# Patient Record
Sex: Male | Born: 1970 | Hispanic: Yes | Marital: Single | Smoking: Never smoker
Health system: Southern US, Community
[De-identification: ages and names within clinical notes are randomized; demographics above are authoritative.]

## PROBLEM LIST (undated history)

## (undated) DIAGNOSIS — B20 Human immunodeficiency virus [HIV] disease: Secondary | ICD-10-CM

## (undated) DIAGNOSIS — T8789 Other complications of amputation stump: Secondary | ICD-10-CM

## (undated) DIAGNOSIS — N289 Disorder of kidney and ureter, unspecified: Secondary | ICD-10-CM

## (undated) DIAGNOSIS — L89892 Pressure ulcer of other site, stage 2: Secondary | ICD-10-CM

## (undated) DIAGNOSIS — M86679 Other chronic osteomyelitis, unspecified ankle and foot: Secondary | ICD-10-CM

## (undated) DIAGNOSIS — N186 End stage renal disease: Secondary | ICD-10-CM

## (undated) DIAGNOSIS — M9080 Osteopathy in diseases classified elsewhere, unspecified site: Secondary | ICD-10-CM

## (undated) DIAGNOSIS — Z992 Dependence on renal dialysis: Secondary | ICD-10-CM

## (undated) DIAGNOSIS — K859 Acute pancreatitis without necrosis or infection, unspecified: Secondary | ICD-10-CM

## (undated) DIAGNOSIS — B182 Chronic viral hepatitis C: Secondary | ICD-10-CM

## (undated) DIAGNOSIS — E1165 Type 2 diabetes mellitus with hyperglycemia: Secondary | ICD-10-CM

## (undated) DIAGNOSIS — B1920 Unspecified viral hepatitis C without hepatic coma: Secondary | ICD-10-CM

## (undated) DIAGNOSIS — I959 Hypotension, unspecified: Secondary | ICD-10-CM

## (undated) DIAGNOSIS — D649 Anemia, unspecified: Secondary | ICD-10-CM

## (undated) DIAGNOSIS — E889 Metabolic disorder, unspecified: Secondary | ICD-10-CM

## (undated) DIAGNOSIS — E1140 Type 2 diabetes mellitus with diabetic neuropathy, unspecified: Secondary | ICD-10-CM

## (undated) DIAGNOSIS — K529 Noninfective gastroenteritis and colitis, unspecified: Secondary | ICD-10-CM

## (undated) DIAGNOSIS — A4902 Methicillin resistant Staphylococcus aureus infection, unspecified site: Secondary | ICD-10-CM

## (undated) DIAGNOSIS — E43 Unspecified severe protein-calorie malnutrition: Secondary | ICD-10-CM

## (undated) DIAGNOSIS — E118 Type 2 diabetes mellitus with unspecified complications: Secondary | ICD-10-CM

## (undated) HISTORY — DX: Other complications of amputation stump: T87.89

## (undated) HISTORY — DX: Acute pancreatitis without necrosis or infection, unspecified: K85.90

## (undated) HISTORY — DX: Chronic viral hepatitis C: B18.2

## (undated) HISTORY — DX: Human immunodeficiency virus (HIV) disease: B20

## (undated) HISTORY — DX: Pressure ulcer of other site, stage 2: L89.892

## (undated) HISTORY — PX: AV FISTULA PLACEMENT: SHX1204

## (undated) NOTE — *Deleted (*Deleted)
[facility] EMERGENCY DEPARTMENT Provider Note   CSN: [id number] Arrival date & time: 02/28/20  1504     History Chief Complaint  Patient presents with  . Chest Pain  . Fever    Alvin Daniels is a 19 y.o. male.  HPI      67 year old male with history of diabetes type 2, HIV on HAART, ESRD on dialysis Monday Wednesday Friday, bilateral BKA's, admission in July with concern for MRSA bacteremia right stump cellulitis who presents with concern for cough, chills and fever.  While he was hospitalized he was unable to have a TEE due to low blood pressure and risk, but decision was made to proceed with 6 weeks of treatment with IV vancomycin with dialysis.  4 days ago, he developed right-sided chest pain worse with deep breaths, cough.  He was evaluated yesterday in the [facility] ED and had a negative chest x-ray, negative Covid testing, and was given p.o. antibiotics.  He completed dialysis today, but describes severe chills and continued feeling of being unwell, and EMS was called and brought him to the emergency department.  His blood pressures were in the 0000000 systolic on arrival with tachycardia and a fever of 102.  BP improved with fluids.  Pt reports his baseline blood pressures are pretty low. (25s)  Spanish interpreter used  Past Medical History:  Diagnosis Date  . AIDS ([place]) 11/22/2014  . Back pain 06/09/2019  . BKA stump complication ([place]) [id number]  . Chronic diarrhea   . Chronic hepatitis C without hepatic coma ([place]) 11/22/2014  . 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 Diabetic neuropathy ([place])   . ESRD (end stage renal disease) on dialysis [facility])    "TTS; don't remember street name" (05/03/2014)  . Hepatitis C   . HIV INFECTION 06/27/2010   Qualifier: Diagnosis of  By: Nickola Major CMA ( [place]), Geni Bers    . Hypotension 06/02/2012  . Metabolic bone disease [id number]  . MRSA infection   . Normocytic anemia 06/17/2012  . Pancreatitis   .  Pressure ulcer of BKA stump, stage 2 ([place]) 11/22/2014  . Renal disorder   . Severe protein-calorie malnutrition ([place]) 06/17/2012  . Uncontrolled diabetes mellitus with complications ([place]) 0000000   Annotation: uncontrolled Qualifier: Diagnosis of  By: Nickola Major CMA Deborra Medina), Jacqueline      Patient Active Problem List   Diagnosis Date Noted  . Amputation stump infection ([place]) 12/02/2019  . Scapular fracture 09/28/2019  . COVID-19 virus infection 09/28/2019  . Chest pain 09/27/2019  . Hx MRSA infection 09/21/2019  . BKA stump complication ([place]) [id number]  . Back pain 06/09/2019  . Abscess of foot   . Abscess of ankle 08/29/2018  . Charcot foot due to diabetes mellitus ([place]) 07/23/2018  . MRSA bacteremia   . Bacteremia 06/18/2018  . Diabetic foot ulcer ([place]) 11/19/2017  . Femur fracture, left ([place]) 08/16/2016  . HIV (human immunodeficiency virus infection) ([place]) 08/16/2016  . GERD (gastroesophageal reflux disease) 05/24/2016  . Burn 04/10/2016  . Pressure ulcer of BKA stump, stage 2 ([name]) 11/22/2014  . Chronic hepatitis C without hepatic coma ([place]) 11/22/2014  . S/P bilateral BKA (below knee amputation) ([place]) 06/17/2014  . HIV disease ([place])   . Poor dentition 10/25/2013  . ESRD on hemodialysis ([place]) 10/25/2013  . Vision changes 06/21/2013  . Orthostatic headache 04/15/2013  . Anemia of chronic renal failure 06/19/2012  . Metabolic bone disease [id number]  . Chronic diarrhea 06/17/2012  . Severe  protein-calorie malnutrition ([place]) 06/17/2012  . Normocytic anemia 06/17/2012  . Cellulitis of right lower extremity 10/19/2010  . Polyneuropathy in diabetes(357.2) 06/27/2010  . Diabetes mellitus with ESRD (end-stage renal disease) ([place]) 01/03/2010    Past Surgical History:  Procedure Laterality Date  . AMPUTATION Left 04/20/2014   Procedure: 3rd toe amputation, 4th Toe Amputation,  5th Toe Amputation;  Surgeon: Newt Minion, MD;  Location: [place];  Service: Orthopedics;   Laterality: Left;  . AMPUTATION Left 05/02/2014   Procedure: Midfoot Amputation;  Surgeon: Newt Minion, MD;  Location: [place];  Service: Orthopedics;  Laterality: Left;  . AMPUTATION Left 06/17/2014   Procedure: AMPUTATION BELOW KNEE;  Surgeon: Newt Minion, MD;  Location: [place];  Service: Orthopedics;  Laterality: Left;  . AMPUTATION Right 09/04/2018   Procedure: RIGHT BELOW KNEE AMPUTATION;  Surgeon: Newt Minion, MD;  Location: [place];  Service: Orthopedics;  Laterality: Right;  RIGHT BELOW KNEE AMPUTATION  . AV FISTULA PLACEMENT Left   . AV FISTULA PLACEMENT Left 05/10/2016   Procedure: Creation Left Arm Brachiocephalic Arteriovenous Fistula and Ligation of Radiocephalic Fistula;  Surgeon: Angelia Mould, MD;  Location: [place];  Service: Vascular;  Laterality: Left;  . FEMUR IM NAIL Left 08/17/2016   Procedure: INTRAMEDULLARY (IM) RETROGRADE FEMORAL NAILING;  Surgeon: Marybelle Killings, MD;  Location: [place];  Service: Orthopedics;  Laterality: Left;  . FOOT AMPUTATION THROUGH ANKLE Left 12/'21/2015   midfoot  . IR GENERIC HISTORICAL Left 05/01/2016   IR THROMBECTOMY AV FISTULA W/THROMBOLYSIS/PTA INC/SHUNT/IMG LEFT 05/01/2016 [name], MD [facility]  . IR GENERIC HISTORICAL  05/01/2016   IR US GUIDE VASC ACCESS LEFT 05/01/2016 [name], MD [facility]  . IR GENERIC HISTORICAL  05/07/2016   IR FLUORO GUIDE CV LINE RIGHT 05/07/2016 Corrie Mckusick, DO [facility]  . IR GENERIC HISTORICAL  05/07/2016   IR US GUIDE VASC ACCESS RIGHT 05/07/2016 Corrie Mckusick, DO [facility]  . IR GENERIC HISTORICAL  05/22/2016   IR US GUIDE VASC ACCESS RIGHT 05/22/2016 Greggory Keen, MD [facility]  . IR GENERIC HISTORICAL  05/22/2016   IR FLUORO GUIDE CV LINE RIGHT 05/22/2016 Greggory Keen, MD [facility]  . IR REMOVAL TUN CV CATH W/O FL  08/21/2016  . PERIPHERAL VASCULAR CATHETERIZATION Left 05/09/2016   Procedure: A/V Fistulagram;  Surgeon: Angelia Mould, MD;  Location: [place]  CV LAB;  Service: Cardiovascular;  Laterality: Left;  arm  . TEE WITHOUT CARDIOVERSION N/A 06/22/2018   Procedure: TRANSESOPHAGEAL ECHOCARDIOGRAM (TEE);  Surgeon: Jerline Pain, MD;  Location: [facility] ENDOSCOPY;  Service: Cardiovascular;  Laterality: N/A;       Family History  Problem Relation Age of Onset  . Diabetes Mother   . Diabetes Father     Social History   Tobacco Use  . Smoking status: Never Smoker  . Smokeless tobacco: Never Used  Vaping Use  . Vaping Use: Never used  Substance Use Topics  . Alcohol use: No  . Drug use: No    Home Medications Prior to Admission medications   Medication Sig Start Date End Date Taking? Authorizing Provider  amoxicillin (AMOXIL) 500 MG capsule Take 1 capsule (500 mg total) by mouth daily. 02/28/20   Lajean Saver, MD  bictegravir-emtricitabine-tenofovir AF (BIKTARVY) 50-200-25 MG TABS tablet Take 1 tablet by mouth daily. 06/09/19   Truman Hayward, MD  Blood Glucose Monitoring Suppl (TRUE METRIX METER) DEVI 1 each by Does not apply route 3 (three)  times daily. 09/15/18   [name], MD  glucose blood (TRUE METRIX BLOOD GLUCOSE TEST) test strip 1 each by Other route 3 (three) times daily. 09/15/18   [name], MD  insulin NPH-regular Human (HUMULIN 70/30) (70-30) 100 UNIT/ML injection Inject 15 Units into the skin 2 (two) times daily before a meal. 02/10/20   [name], MD  Insulin Syringe-Needle U-100 (BD INSULIN SYRINGE ULTRAFINE) 31G X 15/64" 0.5 ML MISC 1 each by Does not apply route 2 (two) times daily. 02/10/20   [name], MD  midodrine (PROAMATINE) 10 MG tablet Take 1 tablet (10 mg total) by mouth 3 (three) times daily with meals. 12/10/19   Regalado, Belkys A, MD  TRUEplus Lancets 28G MISC 1 each by Does not apply route 3 (three) times daily. 09/15/18   [name], MD  vancomycin (VANCOREADY) 500 MG/100ML IVPB Inject 100 mLs (500 mg total) into the vein every Monday, Wednesday, and Friday with hemodialysis.  12/10/19 06/07/20  Regalado, Cassie Freer, MD    Allergies    Patient has no known allergies.  Review of Systems   Review of Systems  Constitutional: Positive for appetite change, chills, fatigue and fever.  HENT: Positive for sore throat.   Eyes: Negative for visual disturbance.  Respiratory: Positive for cough. Negative for shortness of breath.   Cardiovascular: Positive for chest pain.  Gastrointestinal: Negative for abdominal pain, nausea and vomiting.  Genitourinary: Negative for difficulty urinating (does not make urine).  Musculoskeletal: Negative for back pain and neck stiffness.  Skin: Negative for rash.  Neurological: Negative for syncope and headaches.    Physical Exam Updated Vital Signs BP (!) 80/47   Pulse (!) 101   Temp (!) 100.6 F (38.1 C) (Oral)   Resp (!) 22   Ht 5' (1.524 m)   Wt 59 kg   SpO2 99%   BMI 25.39 kg/m   Physical Exam Vitals and nursing note reviewed.  Constitutional:      General: He is not in acute distress.    Appearance: He is well-developed. He is not diaphoretic.  HENT:     Head: Normocephalic and atraumatic.     Comments: Midline uvula, no erythema or exudate to oropharynx Eyes:     Conjunctiva/sclera: Conjunctivae normal.  Cardiovascular:     Rate and Rhythm: Regular rhythm. Tachycardia present.     Heart sounds: Normal heart sounds. No murmur heard.  No friction rub. No gallop.   Pulmonary:     Effort: Pulmonary effort is normal. No respiratory distress.     Breath sounds: Normal breath sounds. No wheezing or rales.  Abdominal:     General: There is no distension.     Palpations: Abdomen is soft.     Tenderness: There is no abdominal tenderness. There is no guarding.  Musculoskeletal:     Cervical back: Normal range of motion.     Comments: Bilateral BKA, no erythema  Skin:    General: Skin is warm and dry.  Neurological:     Mental Status: He is alert and oriented to person, place, and time.     ED Results / Procedures  / Treatments   Labs (all labs ordered are listed, but only abnormal results are displayed) Labs Reviewed  COMPREHENSIVE METABOLIC PANEL - Abnormal; Notable for the following components:      Result Value   Chloride 91 (*)    Glucose, Bld 167 (*)    Creatinine, Ser 5.52 (*)    Calcium 8.3 (*)  Albumin 2.9 (*)    Total Bilirubin 1.5 (*)    GFR, Estimated 11 (*)    Anion gap 20 (*)    All other components within normal limits  CBC WITH DIFFERENTIAL/PLATELET - Abnormal; Notable for the following components:   WBC 12.6 (*)    RBC 2.85 (*)    Hemoglobin 9.5 (*)    HCT 28.8 (*)    MCV 101.1 (*)    Neutro Abs 11.7 (*)    Lymphs Abs 0.5 (*)    All other components within normal limits  APTT - Abnormal; Notable for the following components:   aPTT 45 (*)    All other components within normal limits  CULTURE, BLOOD (ROUTINE X 2)  CULTURE, BLOOD (ROUTINE X 2)  LACTIC ACID, PLASMA  PROTIME-INR  LACTIC ACID, PLASMA    EKG EKG Interpretation  Date/Time:  Monday February 28 2020 15:58:20 EDT Ventricular Rate:  110 PR Interval:    QRS Duration: 130 QT Interval:  340 QTC Calculation: 460 R Axis:   96 Text Interpretation: Sinus tachycardia Artifact in V5-no significant change in comparison to prior ECG Confirmed by Gareth Morgan [phone]) on 02/28/2020 7:02:58 PM   Radiology DG Chest 2 View  Result Date: 02/27/2020 CLINICAL DATA:  Chest pain EXAM: CHEST - 2 VIEW COMPARISON:  09/27/2019 FINDINGS: The heart size and mediastinal contours are within normal limits. Both lungs are clear. Nonacute fracture deformity of the mid left clavicle. IMPRESSION: No acute abnormality of the lungs. Electronically Signed   By: Eddie Candle M.D.   On: 02/27/2020 12:53   CT Angio Chest PE W and/or Wo Contrast  Result Date: 02/28/2020 CLINICAL DATA:  Bacteremia, possible septic emboli EXAM: CT ANGIOGRAPHY CHEST WITH CONTRAST TECHNIQUE: Multidetector CT imaging of the chest was performed using the  standard protocol during bolus administration of intravenous contrast. Multiplanar CT image reconstructions and MIPs were obtained to evaluate the vascular anatomy. CONTRAST:  80mL OMNIPAQUE IOHEXOL 350 MG/ML SOLN COMPARISON:  Chest x-ray from the previous day, 09/27/2019. FINDINGS: Cardiovascular: Thoracic aorta and its branches are well visualized and within normal limits. No aneurysmal dilatation or dissection is noted. Mild atherosclerotic calcifications are seen. No cardiac enlargement is seen. Coronary calcifications are noted. The pulmonary artery shows a normal branching pattern. No focal filling defect to suggest pulmonary embolism is noted. Mediastinum/Nodes: Thoracic inlet is within normal limits. No sizable hilar or mediastinal adenopathy is noted. Calcified lymph nodes are seen consistent with prior granulomatous disease. The esophagus as visualized is within normal limits. Lungs/Pleura: The lungs are well aerated bilaterally. Some changes of mucous plugging in the lower lobe bronchial tree is noted on the right. Some associated atelectatic changes are noted. Similar changes are noted in the left lower lobe. No findings to suggest septic emboli are seen. No pneumothorax or sizable effusion is noted. Upper Abdomen: Visualized upper abdomen is unremarkable. Stable calcifications along the corticomedullary junction in the kidneys are seen similar to prior CT examination. Musculoskeletal: Mild degenerative change of the thoracic spine is noted. No acute bony abnormality is noted. Review of the MIP images confirms the above findings. IMPRESSION: No evidence of pulmonary emboli. No findings to suggest septic emboli Changes of prior granulomatous disease. Mucous plugging in the lower lobes bilaterally with associated atelectatic changes. 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 Aortic Atherosclerosis (ICD10-I70.0). Electronically Signed   By: Inez Catalina M.D.   On: 02/28/2020  17:50    Procedures .Critical Care Performed by: Gareth Morgan,  MD Authorized by: Gareth Morgan, MD   Critical care provider statement:    Critical care time (minutes):  45   Critical care was time spent personally by me on the following activities:  Evaluation of patient's response to treatment, examination of patient, ordering and performing treatments and interventions, ordering and review of laboratory studies, ordering and review of radiographic studies, pulse oximetry, re-evaluation of patient's condition, obtaining history from patient or surrogate and review of old charts   (including critical care time)  Medications Ordered in ED Medications  vancomycin (VANCOREADY) IVPB 1250 mg/250 mL (1,250 mg Intravenous New Bag/Given 02/28/20 1803)  ceFEPIme (MAXIPIME) 1 g in sodium chloride 0.9 % 100 mL IVPB (has no administration in time range)  vancomycin variable dose per unstable renal function (pharmacist dosing) (has no administration in time range)  ceFEPIme (MAXIPIME) 2 g in sodium chloride 0.9 % 100 mL IVPB (0 g Intravenous Stopped 02/28/20 1759)  metroNIDAZOLE (FLAGYL) IVPB 500 mg (0 mg Intravenous Stopped 02/28/20 1833)  acetaminophen (TYLENOL) tablet 1,000 mg (1,000 mg Oral Given 02/28/20 1639)  iohexol (OMNIPAQUE) 350 MG/ML injection 62 mL (62 mLs Intravenous Contrast Given 02/28/20 1734)  sodium chloride 0.9 % bolus 1,000 mL (1,000 mLs Intravenous New Bag/Given 02/28/20 1832)    ED Course  I have reviewed the triage vital signs and the nursing notes.  Pertinent labs & imaging results that were available during my care of the patient were reviewed by me and considered in my medical decision making (see chart for details).    MDM Rules/Calculators/A&P                          64 year old male with history of diabetes type 2, HIV on HAART, ESRD on dialysis Monday Wednesday Friday, bilateral BKA's, admission in July with concern for MRSA bacteremia right stump  cellulitis who presents with concern for cough, chills and fever.  CT shows no findings to suggest PE, septic emboli or clear pneumonia.  Bilateral mucus plugging.  Labs show leukocytosis, no other significant abnormalities.  Lactate is within normal limits.  Initial blood pressure normal after fluids with EMS, however decreased to the [id number] systolic while in the emergency department.  His baseline blood pressures are 0000000 systolic per patient, and noted to be that on prior outpatient visits.  Given dialysis status, initially normal blood pressures and lactic acid, did not order 30 cc/kg of normal saline--- however, did order 1 L as blood pressures decreased.  Given history of MRSA bacteremia and fever of unclear source, possible viral etiology or possible recurrent bacteremia, will admit for continued care.  He was given vancomycin, cefepime and Flagyl.   Final Clinical Impression(s) / ED Diagnoses Final diagnoses:  Sepsis without acute organ dysfunction, due to unspecified organism ([place])  Fever, unspecified fever cause  Right-sided chest pain    Rx / DC Orders ED Discharge Orders    None       Gareth Morgan, MD 02/28/20 1905

---

## 2006-03-11 ENCOUNTER — Emergency Department (HOSPITAL_COMMUNITY): Admission: EM | Admit: 2006-03-11 | Discharge: 2006-03-11 | Payer: Self-pay | Admitting: Emergency Medicine

## 2009-09-28 ENCOUNTER — Inpatient Hospital Stay (HOSPITAL_COMMUNITY): Admission: EM | Admit: 2009-09-28 | Discharge: 2009-10-03 | Payer: Self-pay | Admitting: Emergency Medicine

## 2009-09-29 ENCOUNTER — Ambulatory Visit: Payer: Self-pay | Admitting: Infectious Diseases

## 2009-10-06 ENCOUNTER — Ambulatory Visit: Payer: Self-pay | Admitting: Internal Medicine

## 2009-10-20 ENCOUNTER — Ambulatory Visit: Payer: Self-pay | Admitting: Internal Medicine

## 2009-10-25 ENCOUNTER — Ambulatory Visit: Payer: Self-pay | Admitting: Internal Medicine

## 2009-10-25 LAB — CONVERTED CEMR LAB
Absolute CD4: 263 #/uL — ABNORMAL LOW (ref 381–1469)
Basophils Relative: 0 % (ref 0–1)
CO2: 22 meq/L (ref 19–32)
Creatinine, Ser: 1.69 mg/dL — ABNORMAL HIGH (ref 0.40–1.50)
Eosinophils Absolute: 0.2 10*3/uL (ref 0.0–0.7)
Eosinophils Relative: 3 % (ref 0–5)
Glucose, Bld: 367 mg/dL — ABNORMAL HIGH (ref 70–99)
HCT: 33 % — ABNORMAL LOW (ref 39.0–52.0)
HIV 1 RNA Quant: 24600 copies/mL — ABNORMAL HIGH (ref <48)
HIV-1 RNA Quant, Log: 4.39 — ABNORMAL HIGH (ref <1.68)
Hemoglobin: 10.8 g/dL — ABNORMAL LOW (ref 13.0–17.0)
MCHC: 32.7 g/dL (ref 30.0–36.0)
MCV: 87.1 fL (ref 78.0–100.0)
Monocytes Absolute: 0.5 10*3/uL (ref 0.1–1.0)
Monocytes Relative: 8 % (ref 3–12)
RBC: 3.79 M/uL — ABNORMAL LOW (ref 4.22–5.81)
Sodium: 134 meq/L — ABNORMAL LOW (ref 135–145)
Total Bilirubin: 0.4 mg/dL (ref 0.3–1.2)
Total Protein: 9.2 g/dL — ABNORMAL HIGH (ref 6.0–8.3)

## 2009-10-27 ENCOUNTER — Ambulatory Visit: Payer: Self-pay | Admitting: Internal Medicine

## 2009-11-01 ENCOUNTER — Ambulatory Visit: Payer: Self-pay | Admitting: Internal Medicine

## 2009-11-30 ENCOUNTER — Ambulatory Visit: Payer: Self-pay | Admitting: Cardiology

## 2009-11-30 ENCOUNTER — Inpatient Hospital Stay (HOSPITAL_COMMUNITY): Admission: EM | Admit: 2009-11-30 | Discharge: 2009-12-12 | Payer: Self-pay | Admitting: Emergency Medicine

## 2009-11-30 ENCOUNTER — Ambulatory Visit: Payer: Self-pay | Admitting: Internal Medicine

## 2009-12-01 ENCOUNTER — Encounter (INDEPENDENT_AMBULATORY_CARE_PROVIDER_SITE_OTHER): Payer: Self-pay | Admitting: Internal Medicine

## 2009-12-04 ENCOUNTER — Ambulatory Visit: Payer: Self-pay | Admitting: Infectious Diseases

## 2009-12-05 ENCOUNTER — Encounter (INDEPENDENT_AMBULATORY_CARE_PROVIDER_SITE_OTHER): Payer: Self-pay | Admitting: Cardiology

## 2009-12-12 ENCOUNTER — Ambulatory Visit: Payer: Self-pay | Admitting: Internal Medicine

## 2009-12-25 ENCOUNTER — Encounter: Payer: Self-pay | Admitting: Infectious Disease

## 2009-12-26 ENCOUNTER — Encounter: Payer: Self-pay | Admitting: Infectious Diseases

## 2009-12-27 ENCOUNTER — Telehealth: Payer: Self-pay | Admitting: Infectious Disease

## 2009-12-27 ENCOUNTER — Ambulatory Visit: Payer: Self-pay | Admitting: Internal Medicine

## 2009-12-28 ENCOUNTER — Encounter: Payer: Self-pay | Admitting: Infectious Disease

## 2009-12-28 ENCOUNTER — Telehealth: Payer: Self-pay | Admitting: Infectious Disease

## 2009-12-28 DIAGNOSIS — A4101 Sepsis due to Methicillin susceptible Staphylococcus aureus: Secondary | ICD-10-CM | POA: Insufficient documentation

## 2010-01-03 ENCOUNTER — Ambulatory Visit: Payer: Self-pay | Admitting: Internal Medicine

## 2010-01-03 ENCOUNTER — Ambulatory Visit: Payer: Self-pay | Admitting: Infectious Disease

## 2010-01-03 DIAGNOSIS — E1122 Type 2 diabetes mellitus with diabetic chronic kidney disease: Secondary | ICD-10-CM | POA: Insufficient documentation

## 2010-01-03 DIAGNOSIS — N186 End stage renal disease: Secondary | ICD-10-CM

## 2010-01-03 DIAGNOSIS — B182 Chronic viral hepatitis C: Secondary | ICD-10-CM | POA: Insufficient documentation

## 2010-01-05 ENCOUNTER — Ambulatory Visit: Payer: Self-pay | Admitting: Internal Medicine

## 2010-01-11 ENCOUNTER — Encounter (INDEPENDENT_AMBULATORY_CARE_PROVIDER_SITE_OTHER): Payer: Self-pay | Admitting: *Deleted

## 2010-02-14 ENCOUNTER — Encounter (INDEPENDENT_AMBULATORY_CARE_PROVIDER_SITE_OTHER): Payer: Self-pay | Admitting: Internal Medicine

## 2010-02-14 LAB — CONVERTED CEMR LAB
Albumin: 3.5 g/dL (ref 3.5–5.2)
Basophils Absolute: 0 10*3/uL (ref 0.0–0.1)
Basophils Relative: 0 % (ref 0–1)
Calcium: 8.7 mg/dL (ref 8.4–10.5)
Eosinophils Absolute: 0.3 10*3/uL (ref 0.0–0.7)
Eosinophils Relative: 5 % (ref 0–5)
HCT: 29.6 % — ABNORMAL LOW (ref 39.0–52.0)
Lymphocytes Relative: 31 % (ref 12–46)
Phosphorus: 6.6 mg/dL — ABNORMAL HIGH (ref 2.3–4.6)
Platelets: 192 10*3/uL (ref 150–400)
RDW: 14.7 % (ref 11.5–15.5)
Sodium: 133 meq/L — ABNORMAL LOW (ref 135–145)

## 2010-02-15 ENCOUNTER — Encounter (INDEPENDENT_AMBULATORY_CARE_PROVIDER_SITE_OTHER): Payer: Self-pay | Admitting: Internal Medicine

## 2010-02-21 ENCOUNTER — Encounter: Payer: Self-pay | Admitting: Infectious Disease

## 2010-02-23 ENCOUNTER — Encounter (INDEPENDENT_AMBULATORY_CARE_PROVIDER_SITE_OTHER): Payer: Self-pay | Admitting: Internal Medicine

## 2010-02-23 LAB — CONVERTED CEMR LAB
BUN: 106 mg/dL — ABNORMAL HIGH (ref 6–23)
Chloride: 111 meq/L (ref 96–112)
Glucose, Bld: 120 mg/dL — ABNORMAL HIGH (ref 70–99)
Lymphs Abs: 2 10*3/uL (ref 0.7–4.0)
Monocytes Relative: 6 % (ref 3–12)
Neutro Abs: 3.4 10*3/uL (ref 1.7–7.7)
Neutrophils Relative %: 57 % (ref 43–77)
Potassium: 3.9 meq/L (ref 3.5–5.3)
RBC: 3.58 M/uL — ABNORMAL LOW (ref 4.22–5.81)
Sodium: 135 meq/L (ref 135–145)
WBC: 6 10*3/uL (ref 4.0–10.5)

## 2010-02-24 ENCOUNTER — Encounter (INDEPENDENT_AMBULATORY_CARE_PROVIDER_SITE_OTHER): Payer: Self-pay | Admitting: Internal Medicine

## 2010-03-09 ENCOUNTER — Telehealth (INDEPENDENT_AMBULATORY_CARE_PROVIDER_SITE_OTHER): Payer: Self-pay | Admitting: *Deleted

## 2010-03-12 ENCOUNTER — Encounter (INDEPENDENT_AMBULATORY_CARE_PROVIDER_SITE_OTHER): Payer: Self-pay | Admitting: Internal Medicine

## 2010-03-12 ENCOUNTER — Encounter: Payer: Self-pay | Admitting: Internal Medicine

## 2010-03-12 ENCOUNTER — Ambulatory Visit: Payer: Self-pay | Admitting: Internal Medicine

## 2010-03-12 ENCOUNTER — Inpatient Hospital Stay (HOSPITAL_COMMUNITY)
Admission: AD | Admit: 2010-03-12 | Discharge: 2010-03-20 | Payer: Self-pay | Source: Home / Self Care | Admitting: Internal Medicine

## 2010-03-13 ENCOUNTER — Telehealth (INDEPENDENT_AMBULATORY_CARE_PROVIDER_SITE_OTHER): Payer: Self-pay | Admitting: *Deleted

## 2010-03-15 ENCOUNTER — Ambulatory Visit: Payer: Self-pay | Admitting: Vascular Surgery

## 2010-03-16 DIAGNOSIS — E119 Type 2 diabetes mellitus without complications: Secondary | ICD-10-CM | POA: Insufficient documentation

## 2010-03-22 DIAGNOSIS — D689 Coagulation defect, unspecified: Secondary | ICD-10-CM | POA: Insufficient documentation

## 2010-03-22 DIAGNOSIS — E1151 Type 2 diabetes mellitus with diabetic peripheral angiopathy without gangrene: Secondary | ICD-10-CM | POA: Insufficient documentation

## 2010-04-02 DIAGNOSIS — N2581 Secondary hyperparathyroidism of renal origin: Secondary | ICD-10-CM | POA: Insufficient documentation

## 2010-04-02 DIAGNOSIS — D509 Iron deficiency anemia, unspecified: Secondary | ICD-10-CM | POA: Insufficient documentation

## 2010-04-24 ENCOUNTER — Emergency Department (HOSPITAL_COMMUNITY)
Admission: EM | Admit: 2010-04-24 | Discharge: 2010-04-24 | Payer: Self-pay | Source: Home / Self Care | Admitting: Emergency Medicine

## 2010-04-26 ENCOUNTER — Ambulatory Visit: Payer: Self-pay | Admitting: Internal Medicine

## 2010-06-12 NOTE — Miscellaneous (Signed)
Summary: [facility]: Orders  [facility]: Orders   Imported By: Bonner Puna 01/03/2010 11:11:05  _____________________________________________________________________  External Attachment:    Type:   Image     Comment:   External Document

## 2010-06-12 NOTE — Progress Notes (Signed)
Summary: critical labs  Phone Note Other Incoming   Caller: [facility] Summary of Call: Call from Amy at [facility] concerning pt. critical lab, pt. is seeing Dr. Jobe Igo today at [facility]. Called over there and spoke to nurse [facility] and faxed labs to [phone].  Glucose 458, Creatinine 3.52, and BUN 64 Initial call taken by: Myrtis Hopping CMA Deborra Medina),  December 27, 2009 9:50 AM  Follow-up for Phone Call        Pt may need to come to ER for treatment of his elevated sugar. His creatinine is agt baseline Follow-up by: Alcide Evener MD,  December 27, 2009 3:40 PM     Appended Document: critical labs Spoke with Dr. Ronnell Freshwater nurse Lattie Haw, and she said Dr. Jobe Igo is keeping a close followup on pts. glucose.

## 2010-06-12 NOTE — Miscellaneous (Signed)
Summary: Social Worker Report  Social Worker Report   Imported By: Romelle Starcher 11/03/2009 12:36:52  _____________________________________________________________________  External Attachment:    Type:   Image     Comment:   External Document

## 2010-06-12 NOTE — Progress Notes (Signed)
Summary: [facility] rxes to be delivered 10/31, pt. informed  Phone Note Call from Patient   Caller: Patient Reason for Call: Talk to Nurse Summary of Call: Pt. states he was called yesterday.  Did not know who called him.  RN checke to see when [facility] rxes were sent, 03/06/10.  RN called [facility] and gave them the [facility] delivery address and the case number to complete the rx process.  Medications will be shipped today for arrival on Monday, 03/12/10.  Pt. instructed to return on Monday for rx pickup.  Pt. stated that he could not come on Monday.  He had to work.  Will pick up rxes on Wedneday, Nov. 2. Lorne Skeens RN  March 09, 2010 12:05 PM

## 2010-06-12 NOTE — Miscellaneous (Signed)
Summary: [facility] By: Bonner Puna 03/06/2010 11:03:56  _____________________________________________________________________  External Attachment:    Type:   Image     Comment:   External Document

## 2010-06-12 NOTE — Miscellaneous (Signed)
Summary: clinical update/ryan white  Clinical Lists Changes  Observations: Added new observation of INFECTDIS MD: Tommy Medal (01/11/2010 10:51) Added new observation of RWTITLE: B (01/11/2010 10:51) Added new observation of PAYOR: No Insurance (01/11/2010 10:51) Added new observation of AIDSDAP: Pending (01/11/2010 10:51) Added new observation of INCOMESOURCE: none (01/11/2010 10:51) Added new observation of HOUSEINCOME: 0  (01/11/2010 10:51) Added new observation of #CHILD<18 IN: No  (01/11/2010 10:51) Added new observation of FAMILYSIZE: 1  (01/11/2010 10:51) Added new observation of HOUSING: Stable/permanent  (01/11/2010 10:51) Added new observation of FINASSESSDT: 01/05/2010  (01/11/2010 10:51) Added new observation of YEARLYEXPEN: 0  (01/11/2010 10:51) Added new observation of GENDER: Male  (01/11/2010 10:51) Added new observation of MARITAL STAT: Single  (01/11/2010 10:51) Added new observation of LATINO/HISP: Yes  (01/11/2010 10:51) Added new observation of RACE: Unknown  (01/11/2010 10:51) Added new observation of REC_MESSAGE: Yes  (01/11/2010 10:51) Added new observation of RECPHONECALL: Yes  (01/11/2010 10:51) Added new observation of REC_MAIL: Yes  (01/11/2010 10:51) Added new observation of RW VITAL STA: Active  (01/11/2010 10:51) Added new observation of PATNTCOUNTY: [place]  (01/11/2010 10:51) Added new observation of RWPARTICIP: Yes  (01/11/2010 10:51)

## 2010-06-12 NOTE — Miscellaneous (Signed)
Summary: Hospital Admission.  INTERNAL MEDICINE ADMISSION HISTORY AND PHYSICAL Attending: Dr. Lane Hacker First contact: Dr. Newt Lukes [phone] Second contact: Dr. Stanford Scotland [phone]  PCP:Dr. Jobe Igo [facility])  CC: ESRD in need of starting HD, N/V 2/2 uremia  HPI: 40 y/o hispanic male with pmh significant for HIV (last CD4 count 263 in June/2011), DM, Chronic renal disease (stage V) and hx of MSSA pylonephritis; who came tot he hospital for direct admission in order to start HD tx. Patient was seen by his renal doctor and also his PCP after experiencing increased nausea, vomiting and abdominal pain, thought to be secondary to uremia, with elevated BUN (102). His symptoms improved with antiemetics, but since his renal function has worsen and he is currently on chronic metabolic acidosis the plan for admission to start HD tx was decided. Patient is currently denying fever, chills, CP, SOB, vomiting, hematuria, HA and blurred vision.  He endorses some nausea, and also pain on his LE's secondary to his diabetic neuropathy.  ALLERGIES: NKDA DM (last A1C 15.6 on May/2011) ESRD HIV MSSA pyeloneprhitis wiht hydroureter and presumed MSSA bacteremia (TEE negative for vegation)  Diabetic Neuropathy  MEDICATIONS: ZIDOVUDINE 300 MG TABS (ZIDOVUDINE) toma dose veces al dia EPIVIR 10 MG/ML SOLN (LAMIVUDINE) toma 9ml la primer vez, y luego 53ml una vez al dia SUSTIVA 600 MG TABS (EFAVIRENZ) toma una vez en la noche cada dia ARANESP (ALBUMIN FREE) 100 MCG/0.5ML SOLN (DARBEPOETIN ALFA-POLYSORBATE)  Insulin 70/30 (14 units [place] two times a day ) Phenergan   SOCIAL HISTORY: lives with his cousin, no recrational drugs, no tobacco or alcohol abuse. 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  VITALS: T: 98  P:90   BP: 134/93   R:  20  O2SAT: 100% ON:RA  PHYSICAL EXAM: General:  alert, well-developed, and cooperative to examination.   Head:  normocephalic and atraumatic.   Eyes:  vision grossly intact, pupils equal, pupils round, pupils reactive to light, no injection and anicteric.   Mouth:  pharynx pink and moist, no erythema, and no exudates.   Neck:  supple, full ROM, no thyromegaly, no JVD, and no carotid bruits.   Lungs:  normal respiratory effort, no accessory muscle use, normal breath sounds, no crackles, and no wheezes.  Heart:  normal rate, regular rhythm, no murmur, no gallop, and no rub.   Abdomen:  soft, non-tender, normal bowel sounds, no distention, no guarding, no rebound tenderness, no hepatomegaly, and no splenomegaly.   Msk:  no joint swelling, no joint warmth, and no redness over joints.   Pulses:  2+ DP/PT pulses bilaterally Extremities:  No cyanosis, clubbing or edema.  Neurologic:  alert & oriented X3, cranial nerves II-XII intact, strength normal in all extremities, sensation intact to light touch, and gait normal.   Skin:  turgor normal and no rashes.    LABS: Pending at the moment of admission.   ASSESSMENT AND PLAN:  (1) ESRD/Uremia: Patient symptoms of nausea and vomiting most likely secondary to his uremia and worsening renal disease.Other causes includes UTI. Plan is to r/o other causes for his symptoms and also admit to control his symptoms and start HD tx. -Will check renal profile and CBC with differential -Will start patient on zofran, PPI and renal diet -Will check PTH, TSH, Hep B status, UA, anemia panel/iron studies and Mg  level.. -Will order vein map and will avoid blood draws or IV lines on his left arm (in order to preserve it for HD access). -Will call renal for further evaluation and to start HD therapy; will follow their rec's.  (2) HIV: Will check CD4 count and viral load; will also start tx with his antiretrovirals as dictated by his ID doctor Dr. Tommy Medal. Depending on  his current CD4 count will add preventive tx if needed.  (3) DM: Last HgA1C 15.6 in May/2011; will check a new HgA1C level now and will start him on SSI and 70/30 14 units two times a day (home dose); will check CBG's four times a day and will adjust his insuline dose as rneeded. Will check FLP for startification.  (4)VTE PROPH: lovenox

## 2010-06-12 NOTE — Miscellaneous (Signed)
Summary: HIPAA Restrictions  HIPAA Restrictions   Imported By: Bonner Puna 01/04/2010 15:11:12  _____________________________________________________________________  External Attachment:    Type:   Image     Comment:   External Document

## 2010-06-12 NOTE — Progress Notes (Signed)
Summary: ADAP meds arrived  Phone Note Outgoing Call   Call placed by: Kennyth Lose Summary of Call: Notified pt. ADAP meds ready for pick up Epivir, Sustiva, and Zidovudine. Initial call taken by: Myrtis Hopping CMA Deborra Medina),  March 13, 2010 10:56 AM     Appended Document: ADAP meds arrived pts. cousin picked up ADAP meds to bring to pt. in hospital for when he is discharged

## 2010-06-12 NOTE — Assessment & Plan Note (Signed)
Summary: [facility] IV ABX, new 042 / dde   Visit Type:  New Patient Primary Provider:  Rhina Brackett Dam  CC:  MSSA bacteremia and HIV.  History of Present Illness: 40 yo Hispanic male with newly diagnosed HIV, AIDS, DM CKD who had been found to have repeated MSSA in urine and is being treated for presumed MSSA bacteremia with IV ancef. He has completed nearly 36 days  of IV ancef dating back from first negative blood cultures. His genotype performed in house shows him to have wildtype virus. He had started to bleed at [employer] in last day. He has been followed by Dr> Jobe Igo at at [facility] re his DM, and CKD. He was accompanied by his cousinr and a femal relative. The brother is aware of his HIV status. We spent greater than an hour with this pt including greater than 50 % of time face to face counselling. We reviwed all first line DHHS therapies and I have opted to  put him on renally dosed AZT, 3tc and once daily sustiva.   Current Medications (verified): 1)  Zidovudine 300 Mg Tabs (Zidovudine) .... Toma Dose Veces Al Dia 2)  Epivir 10 Mg/ml Soln (Lamivudine) .... Toma 29ml La Primer [place], Y Luego 19ml Una [place] Al Dia 3)  Sustiva 600 Mg Tabs (Efavirenz) .... Toma Una [place] En La Noche Cada Dia 4)  Cefazolin Sodium 1 Gm Solr (Cefazolin Sodium) 5)  Aranesp (Albumin Free) 100 Mcg/0.4ml Soln (Darbepoetin Alfa-Polysorbate) 6)  Lantus 100 Unit/ml Soln (Insulin Glargine)  Allergies (verified): No Known Drug Allergies   Preventive Screening-Counseling & Management  Alcohol-Tobacco     Alcohol drinks/day: 0     Smoking Status: never   Current Allergies (reviewed today): No known allergies  Past History:  Past Medical History: DM ESRD HIV, AIDS MSSA pyeloneprhitis wiht hydroureter and presumed MSSA bacteremia  (TEE negative for vegation)  Past Surgical History: none  Family History: significant for DM  Social History: lives with his cousin, no recrational drugs   Review of  Systems  The patient denies anorexia, fever, weight loss, weight gain, vision loss, decreased hearing, hoarseness, chest pain, syncope, dyspnea on exertion, peripheral edema, prolonged cough, headaches, hemoptysis, abdominal pain, melena, hematochezia, severe indigestion/heartburn, hematuria, incontinence, genital sores, muscle weakness, suspicious skin lesions, transient blindness, difficulty walking, depression, unusual weight change, abnormal bleeding, and enlarged lymph nodes.    Vital Signs:  Patient profile:   40 year old male Height:      62 inches (157.48 cm) Weight:      99.75 pounds (45.34 kg) BMI:     18.31  Vitals Entered By: Jarrett Ables CMA (January 03, 2010 11:14 AM) CC: MSSA bacteremia, HIV Is Patient Diabetic? Yes Did you bring your meter with you today? No Pain Assessment Patient in pain? yes     Location: legs Intensity: 5 Type: burning Nutritional Status BMI of < 19 = underweight Nutritional Status Detail not eating well  Does patient need assistance? Functional Status Self care Ambulation Normal   Physical Exam  General:  alert, well-hydrated, and underweight appearing.   Head:  normocephalic and atraumatic.   Eyes:  vision grossly intact, pupils equal, and pupils round.   Ears:  no external deformities.   Nose:  no external deformity and no external erythema.   Mouth:  pharynx pink and moist, no erythema, and no exudates.   Neck:  supple and full ROM.   Lungs:  normal respiratory effort, no crackles, and no wheezes.  Heart:  normal rate, regular rhythm, no murmur, no gallop, and no rub.   Abdomen:  soft and no distention.   Msk:  normal ROM and no joint swelling.   Extremities:  1+ left pedal edema and 1+ right pedal edema.   Neurologic:  alert & oriented X3, strength normal in all extremities, and gait normal.   Skin:  he has some bleeding from around the central line insertion site Psych:  Oriented X3 and normally interactive.               Prevention For Positives: 01/03/2010   Safe sex practices discussed with patient. Condoms offered.   Education Materials Provided: 01/03/2010 Safe sex practices discussed with patient. Condoms offered.                          Impression & Recommendations:  Problem # 1:  METHICILLIN SUSCEPTIBLE STAPH AUREUS SEPTICEMIA (ICD-038.11)  I think he has received adequete therapy with greater than 35 days of therapy. Will have home infusion co pull picc line tomorrow. Consider repeat blood cultures 2 weeks after pulling line  Orders: Est. Patient Level V [id number])  Problem # 2:  AIDS (ICD-042)  I will put him on renally dosed AZT, liquid 3tc with sustiva.  His updated medication list for this problem includes:    Cefazolin Sodium 1 Gm Solr (Cefazolin sodium)  Orders: Est. Patient Level V [id number])  Problem # 3:  HEPATITIS C CARRIER (ICD-V02.62)  check hep c viral load. Immunize vs hep a Orders: Est. Patient Level V (99215)Future Orders: T-Hepatitis C Viral Load [id number]) ... 02/07/2010 T-Hepattis C Genotype, DNA [id number]) ... 02/07/2010  Problem # 4:  DIABETES MELLITUS, TYPE II, UNCONTROLLED, W/RENAL COMPS (ICD-250.42)  His updated medication list for this problem includes being followed by DR. Talbot:    Lantus 100 Unit/ml Soln (Insulin glargine)  Orders: Est. Patient Level V [id number])  Problem # 5:  END STAGE RENAL DISEASE (ICD-585.6)  being followed by Dr. Moshe Cipro  Orders: Est. Patient Level V [phone])  Medications Added to Medication List This Visit: 1)  Zidovudine 300 Mg Tabs (Zidovudine) .... Toma dose veces al dia 2)  Epivir 10 Mg/ml Soln (Lamivudine) .... Toma 17ml la primer vez, y luego 21ml una vez al dia 3)  Sustiva 600 Mg Tabs (Efavirenz) .... Toma una vez en la noche cada dia 4)  Cefazolin Sodium 1 Gm Solr (Cefazolin sodium) 5)  Aranesp (albumin Free) 100 Mcg/0.36ml Soln (Darbepoetin alfa-polysorbate) 6)  Lantus 100 Unit/ml Soln (Insulin  glargine)  Other Orders: T-GC Probe, urine [id number]) T-Chlamydia  Probe, urine [id number]) T-Chlamydia  Probe, urine [id number]) Hepatitis A Vaccine (Adult Dose) [id number]) Admin 1st Vaccine [id number]) Flu Vaccine 32yrs + [id number]) Admin of Any Addtl Vaccine [id number]) Future Orders: T-CD4SP ([facility]) (CD4SP) ... 02/07/2010 T-HIV Viral Load [phone]) ... 02/07/2010 T-CBC w/Diff [id number]) ... 02/07/2010 T-Comprehensive Metabolic Panel ([id number]) ... 02/07/2010 T-RPR (Syphilis) [phone]) ... 02/07/2010 T-Lipid Profile [phone]) ... 02/07/2010  Patient Instructions: 1)  Please meet with Canary Brim today 2)  as well as with case management 3)  rtc in 6 weeks to see Dr. Tommy Medal    Immunizations Administered:  Hepatitis A Vaccine # 1:    Vaccine Type: HepA    Site: left deltoid    Mfr: [email or web address]    Dose: 0.5 ml    Route: IM    Given by: Jarrett Ables CMA    Exp. Date: 04/11/2012    Lot #:  [id number]    VIS given: 07/31/04 version given January 03, 2010.  Influenza Vaccine # 1:    Vaccine Type: Fluvax 3+    Site: left deltoid    Dose: 0.5 ml    Route: IM    Given by: Jarrett Ables CMA    Exp. Date: 08/12/2010    Lot #: [id number]    VIS given: 12/04/06 version given January 03, 2010.  Flu Vaccine Consent Questions:    Do you have a history of severe allergic reactions to this vaccine? no    Any prior history of allergic reactions to egg and/or gelatin? no    Do you have a sensitivity to the preservative Thimersol? no    Do you have a past history of Guillan-Barre Syndrome? no    Do you currently have an acute febrile illness? no    Have you ever had a severe reaction to latex? no    Vaccine information given and explained to patient? yes Prescriptions: SUSTIVA 600 MG TABS (EFAVIRENZ) toma una vez en la noche cada dia  #30 x 11   Entered and Authorized by:   Alcide Evener MD   Signed by:   Rhina Brackett Dam MD on 01/03/2010   Method used:    Print then Give to Patient   RxID:   [id number] [place] 10 MG/ML SOLN (LAMIVUDINE) toma 22ml la primer vez, y luego 34ml una vez al dia  #480 x 11   Entered and Authorized by:   Alcide Evener MD   Signed by:   Rhina Brackett Dam MD on 01/03/2010   Method used:   Print then Give to Patient   RxID:   [id number] ZIDOVUDINE 300 MG TABS (ZIDOVUDINE) toma dose veces al dia  #60 x 11   Entered and Authorized by:   Alcide Evener MD   Signed by:   Rhina Brackett Dam MD on 01/03/2010   Method used:   Print then Give to Patient   RxID:   [id number]

## 2010-06-14 NOTE — Progress Notes (Signed)
Summary: Care Plan Oversight  Phone Note Outgoing Call   Call placed by: Alcide Evener MD,  December 28, 2009 4:03 PM Details for Reason: Care Plan Oversight Summary of Call: [id number](> 60 mins) I have supervised home care and/or infusion therapy for this pt, including providing orders for care, review of labs and/or home health care plans, communicating with the home health care professionals and/or patient/caregivers to integrate current information into the medical treatment plan and/or adjust the medical therapy. This supervision has been provided for _70 _minutes during the calendar month. Dates for this oversight __7/31/11 thru 01/09/10.  Treatment of Methicillin sensitive bacteremia  Initial call taken by: Alcide Evener MD,  December 28, 2009 4:03 PM  New Problems: METHICILLIN SUSCEPTIBLE STAPH AUREUS SEPTICEMIA [phone])   New Problems: METHICILLIN SUSCEPTIBLE STAPH AUREUS SEPTICEMIA [phone])

## 2010-06-27 ENCOUNTER — Telehealth: Payer: Self-pay

## 2010-06-27 DIAGNOSIS — B20 Human immunodeficiency virus [HIV] disease: Secondary | ICD-10-CM

## 2010-06-27 DIAGNOSIS — D649 Anemia, unspecified: Secondary | ICD-10-CM | POA: Insufficient documentation

## 2010-06-27 DIAGNOSIS — IMO0002 Reserved for concepts with insufficient information to code with codable children: Secondary | ICD-10-CM

## 2010-06-27 DIAGNOSIS — E119 Type 2 diabetes mellitus without complications: Secondary | ICD-10-CM | POA: Insufficient documentation

## 2010-06-27 DIAGNOSIS — E1165 Type 2 diabetes mellitus with hyperglycemia: Secondary | ICD-10-CM

## 2010-06-27 DIAGNOSIS — N186 End stage renal disease: Secondary | ICD-10-CM | POA: Insufficient documentation

## 2010-06-27 DIAGNOSIS — E1142 Type 2 diabetes mellitus with diabetic polyneuropathy: Secondary | ICD-10-CM | POA: Insufficient documentation

## 2010-06-27 HISTORY — DX: Human immunodeficiency virus (HIV) disease: B20

## 2010-06-27 HISTORY — DX: Type 2 diabetes mellitus with hyperglycemia: E11.65

## 2010-06-27 HISTORY — DX: Reserved for concepts with insufficient information to code with codable children: IMO0002

## 2010-07-02 ENCOUNTER — Encounter (INDEPENDENT_AMBULATORY_CARE_PROVIDER_SITE_OTHER): Payer: Self-pay | Admitting: *Deleted

## 2010-07-04 ENCOUNTER — Encounter (INDEPENDENT_AMBULATORY_CARE_PROVIDER_SITE_OTHER): Payer: Self-pay | Admitting: *Deleted

## 2010-07-04 ENCOUNTER — Other Ambulatory Visit: Payer: Self-pay | Admitting: Adult Health

## 2010-07-04 ENCOUNTER — Encounter: Payer: Self-pay | Admitting: Adult Health

## 2010-07-04 ENCOUNTER — Other Ambulatory Visit (INDEPENDENT_AMBULATORY_CARE_PROVIDER_SITE_OTHER): Payer: Self-pay

## 2010-07-04 DIAGNOSIS — B20 Human immunodeficiency virus [HIV] disease: Secondary | ICD-10-CM

## 2010-07-04 LAB — CONVERTED CEMR LAB
GC Probe Amp, Urine: NEGATIVE
HIV 1 RNA Quant: 23500 copies/mL — ABNORMAL HIGH (ref <20)
Nitrite: NEGATIVE
Protein, ur: >300 mg/dL — AB
Squamous Epithelial / LPF: NONE SEEN /lpf
Urine Glucose: NEGATIVE mg/dL
WBC, UA: >50 cells/hpf — AB (ref <3)
pH: 7 (ref 5.0–8.0)

## 2010-07-05 LAB — T-HELPER CELL (CD4) - (RCID CLINIC ONLY): CD4 % Helper T Cell: 14 % — ABNORMAL LOW (ref 33–55)

## 2010-07-09 ENCOUNTER — Encounter (HOSPITAL_COMMUNITY): Payer: Self-pay

## 2010-07-10 NOTE — Miscellaneous (Signed)
  Clinical Lists Changes  Observations: Added new observation of RACE: White (07/02/2010 14:36)

## 2010-07-10 NOTE — Miscellaneous (Signed)
  Clinical Lists Changes  Observations: Added new observation of HIV RISK BEH: Unknown (07/04/2010 16:56)

## 2010-07-11 LAB — CONVERTED CEMR LAB
Albumin: 3.5 g/dL (ref 3.5–5.2)
Alkaline Phosphatase: 118 units/L — ABNORMAL HIGH (ref 39–117)
BUN: 32 mg/dL — ABNORMAL HIGH (ref 6–23)
CO2: 25 meq/L (ref 19–32)
Calcium: 9.1 mg/dL (ref 8.4–10.5)
Glucose, Bld: 198 mg/dL — ABNORMAL HIGH (ref 70–99)
HDL: 40 mg/dL (ref >39)
Hemoglobin: 12.3 g/dL — ABNORMAL LOW (ref 13.0–17.0)
Hep A Total Ab: POSITIVE — AB
LDL Cholesterol: 41 mg/dL (ref 0–99)
Lymphocytes Relative: 35 % (ref 12–46)
Lymphs Abs: 2 10*3/uL (ref 0.7–4.0)
MCHC: 34 g/dL (ref 30.0–36.0)
Monocytes Absolute: 0.7 10*3/uL (ref 0.1–1.0)
Monocytes Relative: 12 % (ref 3–12)
Neutro Abs: 2.8 10*3/uL (ref 1.7–7.7)
Neutrophils Relative %: 48 % (ref 43–77)
Potassium: 4.4 meq/L (ref 3.5–5.3)
RBC: 4.06 M/uL — ABNORMAL LOW (ref 4.22–5.81)
Sodium: 139 meq/L (ref 135–145)
Total CHOL/HDL Ratio: 2.6
Total Protein: 9.1 g/dL — ABNORMAL HIGH (ref 6.0–8.3)
Triglycerides: 110 mg/dL (ref <150)
WBC: 5.8 10*3/uL (ref 4.0–10.5)

## 2010-07-16 ENCOUNTER — Ambulatory Visit (HOSPITAL_COMMUNITY): Payer: Self-pay | Attending: Vascular Surgery

## 2010-07-16 DIAGNOSIS — N186 End stage renal disease: Secondary | ICD-10-CM | POA: Insufficient documentation

## 2010-07-16 DIAGNOSIS — Z452 Encounter for adjustment and management of vascular access device: Secondary | ICD-10-CM | POA: Insufficient documentation

## 2010-07-18 ENCOUNTER — Ambulatory Visit: Payer: Self-pay | Admitting: Adult Health

## 2010-07-18 ENCOUNTER — Encounter (INDEPENDENT_AMBULATORY_CARE_PROVIDER_SITE_OTHER): Payer: Self-pay | Admitting: *Deleted

## 2010-07-24 LAB — GLUCOSE, CAPILLARY
Glucose-Capillary: 108 mg/dL — ABNORMAL HIGH (ref 70–99)
Glucose-Capillary: 136 mg/dL — ABNORMAL HIGH (ref 70–99)
Glucose-Capillary: 151 mg/dL — ABNORMAL HIGH (ref 70–99)
Glucose-Capillary: 161 mg/dL — ABNORMAL HIGH (ref 70–99)
Glucose-Capillary: 163 mg/dL — ABNORMAL HIGH (ref 70–99)
Glucose-Capillary: 173 mg/dL — ABNORMAL HIGH (ref 70–99)
Glucose-Capillary: 173 mg/dL — ABNORMAL HIGH (ref 70–99)
Glucose-Capillary: 200 mg/dL — ABNORMAL HIGH (ref 70–99)
Glucose-Capillary: 208 mg/dL — ABNORMAL HIGH (ref 70–99)
Glucose-Capillary: 217 mg/dL — ABNORMAL HIGH (ref 70–99)
Glucose-Capillary: 219 mg/dL — ABNORMAL HIGH (ref 70–99)
Glucose-Capillary: 230 mg/dL — ABNORMAL HIGH (ref 70–99)
Glucose-Capillary: 265 mg/dL — ABNORMAL HIGH (ref 70–99)
Glucose-Capillary: 286 mg/dL — ABNORMAL HIGH (ref 70–99)
Glucose-Capillary: 328 mg/dL — ABNORMAL HIGH (ref 70–99)
Glucose-Capillary: 416 mg/dL — ABNORMAL HIGH (ref 70–99)
Glucose-Capillary: 74 mg/dL (ref 70–99)
Glucose-Capillary: 75 mg/dL (ref 70–99)
Glucose-Capillary: 93 mg/dL (ref 70–99)

## 2010-07-24 LAB — TYPE AND SCREEN
ABO/RH(D): O POS
Unit division: 0

## 2010-07-24 LAB — RENAL FUNCTION PANEL
Albumin: 2.8 g/dL — ABNORMAL LOW (ref 3.5–5.2)
Albumin: 2.8 g/dL — ABNORMAL LOW (ref 3.5–5.2)
BUN: 12 mg/dL (ref 6–23)
BUN: 70 mg/dL — ABNORMAL HIGH (ref 6–23)
CO2: 27 mEq/L (ref 19–32)
CO2: 28 mEq/L (ref 19–32)
CO2: 28 mEq/L (ref 19–32)
Calcium: 8.1 mg/dL — ABNORMAL LOW (ref 8.4–10.5)
Calcium: 8.4 mg/dL (ref 8.4–10.5)
Calcium: 8.4 mg/dL (ref 8.4–10.5)
Calcium: 8.5 mg/dL (ref 8.4–10.5)
Calcium: 8.7 mg/dL (ref 8.4–10.5)
Chloride: 101 mEq/L (ref 96–112)
Chloride: 109 mEq/L (ref 96–112)
Creatinine, Ser: 3.27 mg/dL — ABNORMAL HIGH (ref 0.4–1.5)
Creatinine, Ser: 5.29 mg/dL — ABNORMAL HIGH (ref 0.4–1.5)
Creatinine, Ser: 6.25 mg/dL — ABNORMAL HIGH (ref 0.4–1.5)
GFR calc Af Amer: 12 mL/min — ABNORMAL LOW (ref >60)
GFR calc Af Amer: 17 mL/min — ABNORMAL LOW (ref >60)
GFR calc non Af Amer: 10 mL/min — ABNORMAL LOW (ref >60)
GFR calc non Af Amer: 12 mL/min — ABNORMAL LOW (ref >60)
GFR calc non Af Amer: 14 mL/min — ABNORMAL LOW (ref >60)
Glucose, Bld: 137 mg/dL — ABNORMAL HIGH (ref 70–99)
Glucose, Bld: 148 mg/dL — ABNORMAL HIGH (ref 70–99)
Glucose, Bld: 171 mg/dL — ABNORMAL HIGH (ref 70–99)
Glucose, Bld: 49 mg/dL — ABNORMAL LOW (ref 70–99)
Phosphorus: 2.5 mg/dL (ref 2.3–4.6)
Phosphorus: 4.4 mg/dL (ref 2.3–4.6)
Phosphorus: 4.8 mg/dL — ABNORMAL HIGH (ref 2.3–4.6)
Phosphorus: 5.3 mg/dL — ABNORMAL HIGH (ref 2.3–4.6)
Sodium: 131 mEq/L — ABNORMAL LOW (ref 135–145)
Sodium: 134 mEq/L — ABNORMAL LOW (ref 135–145)
Sodium: 135 mEq/L (ref 135–145)
Sodium: 136 mEq/L (ref 135–145)
Sodium: 136 mEq/L (ref 135–145)

## 2010-07-24 LAB — URINALYSIS, ROUTINE W REFLEX MICROSCOPIC
Bilirubin Urine: NEGATIVE
Ketones, ur: NEGATIVE mg/dL
Protein, ur: 30 mg/dL — AB
Urobilinogen, UA: 0.2 mg/dL (ref 0.0–1.0)

## 2010-07-24 LAB — BASIC METABOLIC PANEL
BUN: 11 mg/dL (ref 6–23)
Calcium: 8.3 mg/dL — ABNORMAL LOW (ref 8.4–10.5)
Calcium: 8.7 mg/dL (ref 8.4–10.5)
Chloride: 102 mEq/L (ref 96–112)
GFR calc Af Amer: 13 mL/min — ABNORMAL LOW (ref >60)
GFR calc Af Amer: 25 mL/min — ABNORMAL LOW (ref >60)
GFR calc non Af Amer: 30 mL/min — ABNORMAL LOW (ref >60)
GFR calc non Af Amer: 10 mL/min — ABNORMAL LOW (ref >60)
GFR calc non Af Amer: 21 mL/min — ABNORMAL LOW (ref >60)
Glucose, Bld: 135 mg/dL — ABNORMAL HIGH (ref 70–99)
Potassium: 3 mEq/L — ABNORMAL LOW (ref 3.5–5.1)
Potassium: 4.1 mEq/L (ref 3.5–5.1)
Sodium: 133 mEq/L — ABNORMAL LOW (ref 135–145)
Sodium: 135 mEq/L (ref 135–145)
Sodium: 135 mEq/L (ref 135–145)

## 2010-07-24 LAB — CBC
HCT: 32.5 % — ABNORMAL LOW (ref 39.0–52.0)
HCT: 22.5 % — ABNORMAL LOW (ref 39.0–52.0)
HCT: 24.8 % — ABNORMAL LOW (ref 39.0–52.0)
HCT: 31.9 % — ABNORMAL LOW (ref 39.0–52.0)
Hemoglobin: 11.4 g/dL — ABNORMAL LOW (ref 13.0–17.0)
Hemoglobin: 7.2 g/dL — ABNORMAL LOW (ref 13.0–17.0)
Hemoglobin: 7.2 g/dL — ABNORMAL LOW (ref 13.0–17.0)
Hemoglobin: 7.6 g/dL — ABNORMAL LOW (ref 13.0–17.0)
Hemoglobin: 8.5 g/dL — ABNORMAL LOW (ref 13.0–17.0)
Hemoglobin: 8.6 g/dL — ABNORMAL LOW (ref 13.0–17.0)
MCH: 28 pg (ref 26.0–34.0)
MCH: 28.1 pg (ref 26.0–34.0)
MCH: 28.2 pg (ref 26.0–34.0)
MCH: 29 pg (ref 26.0–34.0)
MCHC: 35.1 g/dL (ref 30.0–36.0)
MCHC: 33.8 g/dL (ref 30.0–36.0)
MCHC: 34.2 g/dL (ref 30.0–36.0)
MCHC: 35 g/dL (ref 30.0–36.0)
MCHC: 35.3 g/dL (ref 30.0–36.0)
MCV: 88.3 fL (ref 78.0–100.0)
MCV: 80.1 fL (ref 78.0–100.0)
MCV: 80.3 fL (ref 78.0–100.0)
MCV: 80.8 fL (ref 78.0–100.0)
MCV: 84.8 fL (ref 78.0–100.0)
Platelets: 136 10*3/uL — ABNORMAL LOW (ref 150–400)
Platelets: 159 10*3/uL (ref 150–400)
Platelets: 162 10*3/uL (ref 150–400)
Platelets: 163 10*3/uL (ref 150–400)
RBC: 2.54 MIL/uL — ABNORMAL LOW (ref 4.22–5.81)
RBC: 2.55 MIL/uL — ABNORMAL LOW (ref 4.22–5.81)
RBC: 2.7 MIL/uL — ABNORMAL LOW (ref 4.22–5.81)
RBC: 3.07 MIL/uL — ABNORMAL LOW (ref 4.22–5.81)
RDW: 16.2 % — ABNORMAL HIGH (ref 11.5–15.5)
RDW: 14.1 % (ref 11.5–15.5)
RDW: 14.3 % (ref 11.5–15.5)
RDW: 14.5 % (ref 11.5–15.5)
WBC: 6.7 10*3/uL (ref 4.0–10.5)
WBC: 4.5 10*3/uL (ref 4.0–10.5)
WBC: 4.9 10*3/uL (ref 4.0–10.5)
WBC: 6.5 10*3/uL (ref 4.0–10.5)

## 2010-07-24 LAB — T-HELPER CELLS (CD4) COUNT (NOT AT ARMC)
CD4 % Helper T Cell: 14 % — ABNORMAL LOW (ref 33–55)
CD4 T Cell Abs: 250 uL — ABNORMAL LOW (ref 400–2700)

## 2010-07-24 LAB — URINE MICROSCOPIC-ADD ON

## 2010-07-24 LAB — BRAIN NATRIURETIC PEPTIDE: Pro B Natriuretic peptide (BNP): <30 pg/mL (ref 0.0–100.0)

## 2010-07-24 NOTE — Miscellaneous (Signed)
Summary: Med preload  Clinical Lists Changes  Medications: Added new medication of ARANESP (ALBUMIN FREE) 200 MCG/ML SOLN (DARBEPOETIN ALFA-POLYSORBATE) 200 mcg IV during hemodialysis Added new medication of SUSTIVA 600 MG TABS (EFAVIRENZ) 1 tab daily at bedtime Added new medication of GABAPENTIN 300 MG CAPS (GABAPENTIN) 1 tab by mouth 3x daily Added new medication of HYDROXYZINE HCL 25 MG TABS (HYDROXYZINE HCL) 1 tab by mouth every 8 hours as needed Added new medication of LANTUS 100 UNIT/ML SOLN (INSULIN GLARGINE) 15 units daily at bedtime Added new medication of EPIVIR 10 MG/ML SOLN (LAMIVUDINE) 50 mg by mouth daily Added new medication of ZOFRAN 4 MG TABS (ONDANSETRON HCL) 1 tab by mouth every 6 hours as needed for nausea and vomiting Added new medication of PANTOPRAZOLE SODIUM 40 MG TBEC (PANTOPRAZOLE SODIUM) 1 tab by mouth daily Added new medication of NEPHRO-VITE 0.8 MG TABS (B COMPLEX-C-FOLIC ACID) 1 tab at bedtime Added new medication of ZIDOVUDINE 300 MG TABS (ZIDOVUDINE) 1 tab by mouth daily

## 2010-07-25 LAB — LIPID PANEL
HDL: 31 mg/dL — ABNORMAL LOW (ref >39)
Total CHOL/HDL Ratio: 3 RATIO
Triglycerides: 113 mg/dL (ref <150)
VLDL: 23 mg/dL (ref 0–40)

## 2010-07-25 LAB — CBC
MCV: 79.6 fL (ref 78.0–100.0)
Platelets: 181 10*3/uL (ref 150–400)
RBC: 3.38 MIL/uL — ABNORMAL LOW (ref 4.22–5.81)
RDW: 13.9 % (ref 11.5–15.5)
WBC: 5 10*3/uL (ref 4.0–10.5)

## 2010-07-25 LAB — APTT: aPTT: 37 seconds (ref 24–37)

## 2010-07-25 LAB — IRON AND TIBC
Iron: 110 ug/dL (ref 42–135)
TIBC: 294 ug/dL (ref 215–435)
UIBC: 184 ug/dL

## 2010-07-25 LAB — DIFFERENTIAL
Basophils Absolute: 0 10*3/uL (ref 0.0–0.1)
Eosinophils Absolute: 0.3 10*3/uL (ref 0.0–0.7)
Eosinophils Relative: 5 % (ref 0–5)
Lymphocytes Relative: 36 % (ref 12–46)
Neutrophils Relative %: 52 % (ref 43–77)

## 2010-07-25 LAB — HEMOGLOBIN A1C
Hgb A1c MFr Bld: 10.2 % — ABNORMAL HIGH (ref <5.7)
Mean Plasma Glucose: 246 mg/dL — ABNORMAL HIGH (ref <117)

## 2010-07-25 LAB — HIV-1 GENOTYPR PLUS

## 2010-07-25 LAB — MRSA PCR SCREENING: MRSA by PCR: NEGATIVE

## 2010-07-25 LAB — HEPATITIS B CORE ANTIBODY, IGM: Hep B C IgM: NEGATIVE

## 2010-07-25 LAB — BASIC METABOLIC PANEL
Chloride: 107 mEq/L (ref 96–112)
Creatinine, Ser: 5.84 mg/dL — ABNORMAL HIGH (ref 0.4–1.5)
GFR calc Af Amer: 13 mL/min — ABNORMAL LOW (ref >60)
Potassium: 3.8 mEq/L (ref 3.5–5.1)
Sodium: 132 mEq/L — ABNORMAL LOW (ref 135–145)

## 2010-07-25 LAB — HEPATITIS B CORE ANTIBODY, TOTAL: Hep B Core Total Ab: NEGATIVE

## 2010-07-25 LAB — PROTIME-INR
INR: 1.05 (ref 0.00–1.49)
Prothrombin Time: 13.9 seconds (ref 11.6–15.2)

## 2010-07-25 LAB — CULTURE, BLOOD (ROUTINE X 2): Culture  Setup Time: 201111010211

## 2010-07-25 LAB — RENAL FUNCTION PANEL
Albumin: 3 g/dL — ABNORMAL LOW (ref 3.5–5.2)
BUN: 71 mg/dL — ABNORMAL HIGH (ref 6–23)
Chloride: 105 mEq/L (ref 96–112)
GFR calc Af Amer: 13 mL/min — ABNORMAL LOW (ref >60)
GFR calc non Af Amer: 11 mL/min — ABNORMAL LOW (ref >60)
Phosphorus: 5.1 mg/dL — ABNORMAL HIGH (ref 2.3–4.6)
Potassium: 4.7 mEq/L (ref 3.5–5.1)
Sodium: 129 mEq/L — ABNORMAL LOW (ref 135–145)

## 2010-07-25 LAB — PTH, INTACT AND CALCIUM: Calcium, Total (PTH): 8.5 mg/dL (ref 8.4–10.5)

## 2010-07-25 LAB — RETICULOCYTES: Retic Ct Pct: 0.6 % (ref 0.4–3.1)

## 2010-07-27 LAB — BASIC METABOLIC PANEL
CO2: 28 mEq/L (ref 19–32)
Calcium: 8.6 mg/dL (ref 8.4–10.5)
Creatinine, Ser: 4.27 mg/dL — ABNORMAL HIGH (ref 0.4–1.5)
GFR calc Af Amer: 19 mL/min — ABNORMAL LOW (ref >60)
Glucose, Bld: 317 mg/dL — ABNORMAL HIGH (ref 70–99)

## 2010-07-27 LAB — GLUCOSE, CAPILLARY
Glucose-Capillary: 189 mg/dL — ABNORMAL HIGH (ref 70–99)
Glucose-Capillary: 277 mg/dL — ABNORMAL HIGH (ref 70–99)
Glucose-Capillary: 312 mg/dL — ABNORMAL HIGH (ref 70–99)

## 2010-07-27 LAB — CBC
MCH: 28.2 pg (ref 26.0–34.0)
MCHC: 33.6 g/dL (ref 30.0–36.0)
Platelets: 331 10*3/uL (ref 150–400)

## 2010-07-28 LAB — GLUCOSE, CAPILLARY
Glucose-Capillary: 107 mg/dL — ABNORMAL HIGH (ref 70–99)
Glucose-Capillary: 112 mg/dL — ABNORMAL HIGH (ref 70–99)
Glucose-Capillary: 115 mg/dL — ABNORMAL HIGH (ref 70–99)
Glucose-Capillary: 121 mg/dL — ABNORMAL HIGH (ref 70–99)
Glucose-Capillary: 138 mg/dL — ABNORMAL HIGH (ref 70–99)
Glucose-Capillary: 144 mg/dL — ABNORMAL HIGH (ref 70–99)
Glucose-Capillary: 145 mg/dL — ABNORMAL HIGH (ref 70–99)
Glucose-Capillary: 153 mg/dL — ABNORMAL HIGH (ref 70–99)
Glucose-Capillary: 168 mg/dL — ABNORMAL HIGH (ref 70–99)
Glucose-Capillary: 177 mg/dL — ABNORMAL HIGH (ref 70–99)
Glucose-Capillary: 179 mg/dL — ABNORMAL HIGH (ref 70–99)
Glucose-Capillary: 184 mg/dL — ABNORMAL HIGH (ref 70–99)
Glucose-Capillary: 190 mg/dL — ABNORMAL HIGH (ref 70–99)
Glucose-Capillary: 204 mg/dL — ABNORMAL HIGH (ref 70–99)
Glucose-Capillary: 228 mg/dL — ABNORMAL HIGH (ref 70–99)
Glucose-Capillary: 233 mg/dL — ABNORMAL HIGH (ref 70–99)
Glucose-Capillary: 251 mg/dL — ABNORMAL HIGH (ref 70–99)
Glucose-Capillary: 276 mg/dL — ABNORMAL HIGH (ref 70–99)
Glucose-Capillary: 456 mg/dL — ABNORMAL HIGH (ref 70–99)
Glucose-Capillary: 576 mg/dL (ref 70–99)
Glucose-Capillary: 74 mg/dL (ref 70–99)
Glucose-Capillary: 81 mg/dL (ref 70–99)

## 2010-07-28 LAB — URINE MICROSCOPIC-ADD ON

## 2010-07-28 LAB — CBC
HCT: 24.8 % — ABNORMAL LOW (ref 39.0–52.0)
HCT: 25.4 % — ABNORMAL LOW (ref 39.0–52.0)
HCT: 25.9 % — ABNORMAL LOW (ref 39.0–52.0)
HCT: 29.6 % — ABNORMAL LOW (ref 39.0–52.0)
HCT: 30.5 % — ABNORMAL LOW (ref 39.0–52.0)
Hemoglobin: 10.2 g/dL — ABNORMAL LOW (ref 13.0–17.0)
Hemoglobin: 8.8 g/dL — ABNORMAL LOW (ref 13.0–17.0)
Hemoglobin: 9.2 g/dL — ABNORMAL LOW (ref 13.0–17.0)
MCH: 28.3 pg (ref 26.0–34.0)
MCH: 28.7 pg (ref 26.0–34.0)
MCH: 28.8 pg (ref 26.0–34.0)
MCH: 29.4 pg (ref 26.0–34.0)
MCHC: 33.8 g/dL (ref 30.0–36.0)
MCHC: 34 g/dL (ref 30.0–36.0)
MCHC: 34.1 g/dL (ref 30.0–36.0)
MCHC: 34.3 g/dL (ref 30.0–36.0)
MCHC: 34.7 g/dL (ref 30.0–36.0)
MCV: 83.4 fL (ref 78.0–100.0)
MCV: 83.6 fL (ref 78.0–100.0)
MCV: 84.3 fL (ref 78.0–100.0)
Platelets: 276 10*3/uL (ref 150–400)
Platelets: 299 10*3/uL (ref 150–400)
RBC: 2.98 MIL/uL — ABNORMAL LOW (ref 4.22–5.81)
RBC: 3.02 MIL/uL — ABNORMAL LOW (ref 4.22–5.81)
RBC: 3.22 MIL/uL — ABNORMAL LOW (ref 4.22–5.81)
RBC: 3.51 MIL/uL — ABNORMAL LOW (ref 4.22–5.81)
RDW: 13 % (ref 11.5–15.5)
RDW: 13.1 % (ref 11.5–15.5)
RDW: 13.1 % (ref 11.5–15.5)
RDW: 13.2 % (ref 11.5–15.5)
RDW: 13.5 % (ref 11.5–15.5)
RDW: 13.6 % (ref 11.5–15.5)
WBC: 6.6 10*3/uL (ref 4.0–10.5)
WBC: 7.3 10*3/uL (ref 4.0–10.5)
WBC: 9.2 10*3/uL (ref 4.0–10.5)

## 2010-07-28 LAB — ABO/RH: ABO/RH(D): O POS

## 2010-07-28 LAB — COMPREHENSIVE METABOLIC PANEL
ALT: 17 U/L (ref 0–53)
Albumin: 2 g/dL — ABNORMAL LOW (ref 3.5–5.2)
BUN: 48 mg/dL — ABNORMAL HIGH (ref 6–23)
Calcium: 8.2 mg/dL — ABNORMAL LOW (ref 8.4–10.5)
Calcium: 8.9 mg/dL (ref 8.4–10.5)
Chloride: 108 mEq/L (ref 96–112)
Creatinine, Ser: 3.28 mg/dL — ABNORMAL HIGH (ref 0.4–1.5)
Creatinine, Ser: 3.54 mg/dL — ABNORMAL HIGH (ref 0.4–1.5)
Glucose, Bld: 583 mg/dL (ref 70–99)
Sodium: 120 mEq/L — ABNORMAL LOW (ref 135–145)
Total Bilirubin: 0.2 mg/dL — ABNORMAL LOW (ref 0.3–1.2)
Total Protein: 7.6 g/dL (ref 6.0–8.3)
Total Protein: 9.8 g/dL — ABNORMAL HIGH (ref 6.0–8.3)

## 2010-07-28 LAB — UIFE/LIGHT CHAINS/TP QN, 24-HR UR
Albumin, U: DETECTED
Free Kappa/Lambda Ratio: 3.58 ratio (ref 0.46–4.00)
Free Lambda Excretion/Day: 444 mg/d
Free Lambda Lt Chains,Ur: 22.2 mg/dL — ABNORMAL HIGH (ref 0.08–1.01)
Time: 24 hours
Total Protein, Urine-Ur/day: 2124 mg/d — ABNORMAL HIGH (ref 10–140)
Total Protein, Urine: 106.2 mg/dL

## 2010-07-28 LAB — CULTURE, BLOOD (ROUTINE X 2)
Culture: NO GROWTH
Culture: NO GROWTH
Culture: NO GROWTH

## 2010-07-28 LAB — RENAL FUNCTION PANEL
Albumin: 1.8 g/dL — ABNORMAL LOW (ref 3.5–5.2)
BUN: 37 mg/dL — ABNORMAL HIGH (ref 6–23)
CO2: 15 mEq/L — ABNORMAL LOW (ref 19–32)
CO2: 16 mEq/L — ABNORMAL LOW (ref 19–32)
CO2: 20 mEq/L (ref 19–32)
CO2: 21 mEq/L (ref 19–32)
Calcium: 8.5 mg/dL (ref 8.4–10.5)
Calcium: 8.7 mg/dL (ref 8.4–10.5)
Calcium: 8.7 mg/dL (ref 8.4–10.5)
Chloride: 104 mEq/L (ref 96–112)
Chloride: 107 mEq/L (ref 96–112)
Creatinine, Ser: 3.5 mg/dL — ABNORMAL HIGH (ref 0.4–1.5)
Creatinine, Ser: 4.05 mg/dL — ABNORMAL HIGH (ref 0.4–1.5)
Creatinine, Ser: 4.83 mg/dL — ABNORMAL HIGH (ref 0.4–1.5)
Creatinine, Ser: 5.04 mg/dL — ABNORMAL HIGH (ref 0.4–1.5)
GFR calc Af Amer: 16 mL/min — ABNORMAL LOW (ref >60)
GFR calc Af Amer: 20 mL/min — ABNORMAL LOW (ref >60)
GFR calc non Af Amer: 13 mL/min — ABNORMAL LOW (ref >60)
GFR calc non Af Amer: 14 mL/min — ABNORMAL LOW (ref >60)
GFR calc non Af Amer: 17 mL/min — ABNORMAL LOW (ref >60)
Glucose, Bld: 156 mg/dL — ABNORMAL HIGH (ref 70–99)
Glucose, Bld: 72 mg/dL (ref 70–99)
Phosphorus: 5.1 mg/dL — ABNORMAL HIGH (ref 2.3–4.6)
Potassium: 4.2 mEq/L (ref 3.5–5.1)

## 2010-07-28 LAB — URINALYSIS, ROUTINE W REFLEX MICROSCOPIC
Bilirubin Urine: NEGATIVE
Glucose, UA: NEGATIVE mg/dL
Glucose, UA: NEGATIVE mg/dL
Ketones, ur: NEGATIVE mg/dL
Nitrite: POSITIVE — AB
Specific Gravity, Urine: 1.008 (ref 1.005–1.030)
Specific Gravity, Urine: 1.018 (ref 1.005–1.030)
Urobilinogen, UA: 0.2 mg/dL (ref 0.0–1.0)
pH: 6 (ref 5.0–8.0)
pH: 6 (ref 5.0–8.0)

## 2010-07-28 LAB — CROSSMATCH
ABO/RH(D): O POS
Antibody Screen: NEGATIVE

## 2010-07-28 LAB — T-HELPER CELLS (CD4) COUNT (NOT AT ARMC)
CD4 % Helper T Cell: 9 % — ABNORMAL LOW (ref 33–55)
CD4 T Cell Abs: 210 uL — ABNORMAL LOW (ref 400–2700)

## 2010-07-28 LAB — HIV ANTIBODY (ROUTINE TESTING W REFLEX)
HIV: REACTIVE — AB
HIV: REACTIVE — AB

## 2010-07-28 LAB — PROTEIN ELECTROPHORESIS, SERUM
Albumin ELP: 31.9 % — ABNORMAL LOW (ref 55.8–66.1)
Alpha-1-Globulin: 5.4 % — ABNORMAL HIGH (ref 2.9–4.9)
Beta 2: 9.3 % — ABNORMAL HIGH (ref 3.2–6.5)
Beta Globulin: 6.6 % (ref 4.7–7.2)

## 2010-07-28 LAB — BASIC METABOLIC PANEL
BUN: 26 mg/dL — ABNORMAL HIGH (ref 6–23)
BUN: 31 mg/dL — ABNORMAL HIGH (ref 6–23)
BUN: 31 mg/dL — ABNORMAL HIGH (ref 6–23)
BUN: 41 mg/dL — ABNORMAL HIGH (ref 6–23)
CO2: 17 mEq/L — ABNORMAL LOW (ref 19–32)
CO2: 26 mEq/L (ref 19–32)
CO2: 27 mEq/L (ref 19–32)
Calcium: 8.3 mg/dL — ABNORMAL LOW (ref 8.4–10.5)
Calcium: 8.4 mg/dL (ref 8.4–10.5)
Calcium: 8.5 mg/dL (ref 8.4–10.5)
Chloride: 104 mEq/L (ref 96–112)
Chloride: 107 mEq/L (ref 96–112)
Creatinine, Ser: 4.46 mg/dL — ABNORMAL HIGH (ref 0.4–1.5)
GFR calc Af Amer: 18 mL/min — ABNORMAL LOW (ref >60)
GFR calc Af Amer: 20 mL/min — ABNORMAL LOW (ref >60)
GFR calc non Af Amer: 16 mL/min — ABNORMAL LOW (ref >60)
GFR calc non Af Amer: 16 mL/min — ABNORMAL LOW (ref >60)
GFR calc non Af Amer: 16 mL/min — ABNORMAL LOW (ref >60)
Glucose, Bld: 163 mg/dL — ABNORMAL HIGH (ref 70–99)
Glucose, Bld: 163 mg/dL — ABNORMAL HIGH (ref 70–99)
Glucose, Bld: 269 mg/dL — ABNORMAL HIGH (ref 70–99)
Potassium: 3.9 mEq/L (ref 3.5–5.1)
Potassium: 4 mEq/L (ref 3.5–5.1)
Sodium: 134 mEq/L — ABNORMAL LOW (ref 135–145)

## 2010-07-28 LAB — DIFFERENTIAL
Lymphocytes Relative: 16 % (ref 12–46)
Lymphs Abs: 1.8 10*3/uL (ref 0.7–4.0)
Monocytes Relative: 9 % (ref 3–12)
Neutro Abs: 8.4 10*3/uL — ABNORMAL HIGH (ref 1.7–7.7)
Neutrophils Relative %: 75 % (ref 43–77)

## 2010-07-28 LAB — PROTIME-INR
INR: 1.1 (ref 0.00–1.49)
Prothrombin Time: 14.1 seconds (ref 11.6–15.2)

## 2010-07-28 LAB — HIV 1/2 CONFIRMATION: HIV-1 antibody: POSITIVE

## 2010-07-28 LAB — URINE CULTURE

## 2010-07-28 LAB — PROTEIN / CREATININE RATIO, URINE
Creatinine, Urine: 42 mg/dL
Protein Creatinine Ratio: 1.29 — ABNORMAL HIGH (ref 0.00–0.15)

## 2010-07-28 LAB — HEPATITIS PANEL, ACUTE
HCV Ab: REACTIVE — AB
Hep A IgM: NEGATIVE
Hepatitis B Surface Ag: NEGATIVE

## 2010-07-28 LAB — SODIUM, URINE, RANDOM: Sodium, Ur: 42 mEq/L

## 2010-07-28 LAB — KETONES, QUALITATIVE: Acetone, Bld: NEGATIVE

## 2010-07-28 LAB — APTT: aPTT: 43 seconds — ABNORMAL HIGH (ref 24–37)

## 2010-07-28 LAB — HEMOGLOBIN A1C: Mean Plasma Glucose: 355 mg/dL — ABNORMAL HIGH (ref <117)

## 2010-07-28 LAB — HIV-1 GENOTYPR PLUS

## 2010-07-30 LAB — GLUCOSE, CAPILLARY
Glucose-Capillary: 146 mg/dL — ABNORMAL HIGH (ref 70–99)
Glucose-Capillary: 167 mg/dL — ABNORMAL HIGH (ref 70–99)
Glucose-Capillary: 195 mg/dL — ABNORMAL HIGH (ref 70–99)
Glucose-Capillary: 207 mg/dL — ABNORMAL HIGH (ref 70–99)
Glucose-Capillary: 220 mg/dL — ABNORMAL HIGH (ref 70–99)
Glucose-Capillary: 274 mg/dL — ABNORMAL HIGH (ref 70–99)
Glucose-Capillary: 323 mg/dL — ABNORMAL HIGH (ref 70–99)
Glucose-Capillary: 325 mg/dL — ABNORMAL HIGH (ref 70–99)
Glucose-Capillary: 330 mg/dL — ABNORMAL HIGH (ref 70–99)
Glucose-Capillary: 337 mg/dL — ABNORMAL HIGH (ref 70–99)
Glucose-Capillary: 390 mg/dL — ABNORMAL HIGH (ref 70–99)
Glucose-Capillary: 409 mg/dL — ABNORMAL HIGH (ref 70–99)
Glucose-Capillary: 420 mg/dL — ABNORMAL HIGH (ref 70–99)
Glucose-Capillary: 471 mg/dL — ABNORMAL HIGH (ref 70–99)
Glucose-Capillary: 85 mg/dL (ref 70–99)

## 2010-07-30 LAB — AFB CULTURE WITH SMEAR (NOT AT ARMC): Acid Fast Smear: NONE SEEN

## 2010-07-30 LAB — BASIC METABOLIC PANEL
BUN: 11 mg/dL (ref 6–23)
BUN: 14 mg/dL (ref 6–23)
BUN: 29 mg/dL — ABNORMAL HIGH (ref 6–23)
CO2: 23 mEq/L (ref 19–32)
CO2: 24 mEq/L (ref 19–32)
Calcium: 7.8 mg/dL — ABNORMAL LOW (ref 8.4–10.5)
Chloride: 106 mEq/L (ref 96–112)
Chloride: 107 mEq/L (ref 96–112)
Creatinine, Ser: 1.36 mg/dL (ref 0.4–1.5)
GFR calc Af Amer: >60 mL/min (ref >60)
GFR calc non Af Amer: 59 mL/min — ABNORMAL LOW (ref >60)
GFR calc non Af Amer: >60 mL/min (ref >60)
GFR calc non Af Amer: >60 mL/min (ref >60)
Glucose, Bld: 272 mg/dL — ABNORMAL HIGH (ref 70–99)
Glucose, Bld: 275 mg/dL — ABNORMAL HIGH (ref 70–99)
Glucose, Bld: 303 mg/dL — ABNORMAL HIGH (ref 70–99)
Glucose, Bld: 340 mg/dL — ABNORMAL HIGH (ref 70–99)
Glucose, Bld: 363 mg/dL — ABNORMAL HIGH (ref 70–99)
Potassium: 3.3 mEq/L — ABNORMAL LOW (ref 3.5–5.1)
Potassium: 3.4 mEq/L — ABNORMAL LOW (ref 3.5–5.1)
Potassium: 3.6 mEq/L (ref 3.5–5.1)
Potassium: 3.8 mEq/L (ref 3.5–5.1)
Potassium: 3.9 mEq/L (ref 3.5–5.1)
Sodium: 134 mEq/L — ABNORMAL LOW (ref 135–145)
Sodium: 138 mEq/L (ref 135–145)

## 2010-07-30 LAB — CBC
HCT: 26.6 % — ABNORMAL LOW (ref 39.0–52.0)
HCT: 27.3 % — ABNORMAL LOW (ref 39.0–52.0)
HCT: 27.7 % — ABNORMAL LOW (ref 39.0–52.0)
HCT: 28 % — ABNORMAL LOW (ref 39.0–52.0)
HCT: 28.8 % — ABNORMAL LOW (ref 39.0–52.0)
HCT: 34 % — ABNORMAL LOW (ref 39.0–52.0)
Hemoglobin: 11.7 g/dL — ABNORMAL LOW (ref 13.0–17.0)
Hemoglobin: 9.3 g/dL — ABNORMAL LOW (ref 13.0–17.0)
Hemoglobin: 9.4 g/dL — ABNORMAL LOW (ref 13.0–17.0)
Hemoglobin: 9.5 g/dL — ABNORMAL LOW (ref 13.0–17.0)
Hemoglobin: 9.6 g/dL — ABNORMAL LOW (ref 13.0–17.0)
MCHC: 34.2 g/dL (ref 30.0–36.0)
MCHC: 35 g/dL (ref 30.0–36.0)
MCV: 86.2 fL (ref 78.0–100.0)
MCV: 86.4 fL (ref 78.0–100.0)
MCV: 86.4 fL (ref 78.0–100.0)
MCV: 86.7 fL (ref 78.0–100.0)
Platelets: 305 10*3/uL (ref 150–400)
Platelets: 323 10*3/uL (ref 150–400)
Platelets: 332 10*3/uL (ref 150–400)
Platelets: 349 10*3/uL (ref 150–400)
RBC: 3.12 MIL/uL — ABNORMAL LOW (ref 4.22–5.81)
RBC: 3.15 MIL/uL — ABNORMAL LOW (ref 4.22–5.81)
RBC: 3.94 MIL/uL — ABNORMAL LOW (ref 4.22–5.81)
RDW: 11.9 % (ref 11.5–15.5)
RDW: 12.1 % (ref 11.5–15.5)
RDW: 12.2 % (ref 11.5–15.5)
RDW: 12.3 % (ref 11.5–15.5)
WBC: 12.2 10*3/uL — ABNORMAL HIGH (ref 4.0–10.5)
WBC: 14.5 10*3/uL — ABNORMAL HIGH (ref 4.0–10.5)
WBC: 17.5 10*3/uL — ABNORMAL HIGH (ref 4.0–10.5)

## 2010-07-30 LAB — DIFFERENTIAL
Basophils Absolute: 0 10*3/uL (ref 0.0–0.1)
Basophils Absolute: 0 10*3/uL (ref 0.0–0.1)
Basophils Absolute: 0 10*3/uL (ref 0.0–0.1)
Basophils Relative: 0 % (ref 0–1)
Basophils Relative: 0 % (ref 0–1)
Basophils Relative: 0 % (ref 0–1)
Eosinophils Absolute: 0.1 10*3/uL (ref 0.0–0.7)
Eosinophils Absolute: 0.2 10*3/uL (ref 0.0–0.7)
Eosinophils Relative: 0 % (ref 0–5)
Eosinophils Relative: 1 % (ref 0–5)
Eosinophils Relative: 2 % (ref 0–5)
Lymphocytes Relative: 12 % (ref 12–46)
Lymphocytes Relative: 21 % (ref 12–46)
Lymphocytes Relative: 25 % (ref 12–46)
Monocytes Absolute: 0.9 10*3/uL (ref 0.1–1.0)
Monocytes Absolute: 1 10*3/uL (ref 0.1–1.0)
Monocytes Absolute: 1.3 10*3/uL — ABNORMAL HIGH (ref 0.1–1.0)
Monocytes Relative: 11 % (ref 3–12)
Neutro Abs: 14 10*3/uL — ABNORMAL HIGH (ref 1.7–7.7)
Neutro Abs: 5.6 10*3/uL (ref 1.7–7.7)
Neutrophils Relative %: 80 % — ABNORMAL HIGH (ref 43–77)

## 2010-07-30 LAB — ANAEROBIC CULTURE

## 2010-07-30 LAB — URINE CULTURE

## 2010-07-30 LAB — URINALYSIS, ROUTINE W REFLEX MICROSCOPIC
Bilirubin Urine: NEGATIVE
Ketones, ur: NEGATIVE mg/dL
Nitrite: POSITIVE — AB
Specific Gravity, Urine: 1.021 (ref 1.005–1.030)
Urobilinogen, UA: 0.2 mg/dL (ref 0.0–1.0)
pH: 6 (ref 5.0–8.0)

## 2010-07-30 LAB — OVA AND PARASITE EXAMINATION

## 2010-07-30 LAB — KETONES, QUALITATIVE: Acetone, Bld: NEGATIVE

## 2010-07-30 LAB — POCT I-STAT, CHEM 8
Chloride: 101 mEq/L (ref 96–112)
Glucose, Bld: 392 mg/dL — ABNORMAL HIGH (ref 70–99)
HCT: 34 % — ABNORMAL LOW (ref 39.0–52.0)
Hemoglobin: 11.6 g/dL — ABNORMAL LOW (ref 13.0–17.0)
Potassium: 4 mEq/L (ref 3.5–5.1)

## 2010-07-30 LAB — COMPREHENSIVE METABOLIC PANEL
Alkaline Phosphatase: 142 U/L — ABNORMAL HIGH (ref 39–117)
BUN: 33 mg/dL — ABNORMAL HIGH (ref 6–23)
CO2: 23 mEq/L (ref 19–32)
Chloride: 93 mEq/L — ABNORMAL LOW (ref 96–112)
Creatinine, Ser: 1.47 mg/dL (ref 0.4–1.5)
GFR calc non Af Amer: 54 mL/min — ABNORMAL LOW (ref >60)
Glucose, Bld: 372 mg/dL — ABNORMAL HIGH (ref 70–99)
Potassium: 3.9 mEq/L (ref 3.5–5.1)
Total Bilirubin: 0.7 mg/dL (ref 0.3–1.2)

## 2010-07-30 LAB — APTT: aPTT: 35 seconds (ref 24–37)

## 2010-07-30 LAB — FUNGUS CULTURE W SMEAR: Fungal Smear: NONE SEEN

## 2010-07-30 LAB — MRSA PCR SCREENING: MRSA by PCR: POSITIVE — AB

## 2010-07-30 LAB — CULTURE, ROUTINE-ABSCESS

## 2010-07-30 LAB — POCT I-STAT 3, VENOUS BLOOD GAS (G3P V)
Acid-Base Excess: 1 mmol/L (ref 0.0–2.0)
pH, Ven: 7.487 — ABNORMAL HIGH (ref 7.250–7.300)
pO2, Ven: 99 mmHg — ABNORMAL HIGH (ref 30.0–45.0)

## 2010-07-30 LAB — URINE MICROSCOPIC-ADD ON

## 2010-07-30 LAB — T-HELPER CELLS (CD4) COUNT (NOT AT ARMC)
CD4 % Helper T Cell: 12 % — ABNORMAL LOW (ref 33–55)
CD4 T Cell Abs: 230 uL — ABNORMAL LOW (ref 400–2700)

## 2010-07-30 LAB — CULTURE, BLOOD (ROUTINE X 2)

## 2010-07-30 LAB — HEMOGLOBIN A1C: Hgb A1c MFr Bld: 15.6 % — ABNORMAL HIGH (ref <5.7)

## 2010-07-30 LAB — LIPASE, BLOOD: Lipase: 32 U/L (ref 11–59)

## 2010-07-30 LAB — GC/CHLAMYDIA PROBE AMP, URINE: GC Probe Amp, Urine: NEGATIVE

## 2010-07-30 LAB — MAGNESIUM: Magnesium: 1.9 mg/dL (ref 1.5–2.5)

## 2010-08-09 NOTE — Progress Notes (Signed)
Summary: New 042 Referrral   Phone Note From Other Clinic   Caller: Dr Sanda Klein office  Summary of Call: Pt missed several  [facility] with [facility] appts.   They were told there is no one there to treat HIV.,   Referral was sent to our office in early Jan. 2012. Requesting appt.    Appt scheduled.   Feb 22 @ 11:00 am Labs only July 17, 2009  OV with Donovan Kail.  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

## 2010-09-19 ENCOUNTER — Telehealth: Payer: Self-pay | Admitting: *Deleted

## 2010-09-19 ENCOUNTER — Ambulatory Visit: Payer: Self-pay | Admitting: Infectious Diseases

## 2010-09-19 NOTE — Telephone Encounter (Signed)
Patient referred to [facility] for no show new patient referral Myrtis Hopping CMA

## 2010-09-28 ENCOUNTER — Telehealth: Payer: Self-pay | Admitting: Infectious Disease

## 2010-09-28 NOTE — Telephone Encounter (Signed)
THIS IS TO DOCUMENT THAT PATIENT RECEIVED HIS PNEUMOCOCCAL VACCINE ON 12/01/2009 WHILE AN INPATIENT AT [place]

## 2010-10-04 ENCOUNTER — Emergency Department (HOSPITAL_COMMUNITY): Payer: Medicaid Other

## 2010-10-04 ENCOUNTER — Inpatient Hospital Stay (HOSPITAL_COMMUNITY)
Admission: EM | Admit: 2010-10-04 | Discharge: 2010-10-09 | DRG: 974 | Disposition: A | Discharge status: Home / Self Care | Payer: Medicaid Other | Attending: Family Medicine | Admitting: Family Medicine

## 2010-10-04 DIAGNOSIS — L02419 Cutaneous abscess of limb, unspecified: Secondary | ICD-10-CM | POA: Diagnosis present

## 2010-10-04 DIAGNOSIS — E118 Type 2 diabetes mellitus with unspecified complications: Secondary | ICD-10-CM

## 2010-10-04 DIAGNOSIS — N186 End stage renal disease: Secondary | ICD-10-CM

## 2010-10-04 DIAGNOSIS — Z992 Dependence on renal dialysis: Secondary | ICD-10-CM

## 2010-10-04 DIAGNOSIS — K3184 Gastroparesis: Secondary | ICD-10-CM | POA: Diagnosis present

## 2010-10-04 DIAGNOSIS — E1142 Type 2 diabetes mellitus with diabetic polyneuropathy: Secondary | ICD-10-CM | POA: Diagnosis present

## 2010-10-04 DIAGNOSIS — E1029 Type 1 diabetes mellitus with other diabetic kidney complication: Secondary | ICD-10-CM | POA: Diagnosis present

## 2010-10-04 DIAGNOSIS — I120 Hypertensive chronic kidney disease with stage 5 chronic kidney disease or end stage renal disease: Secondary | ICD-10-CM | POA: Diagnosis present

## 2010-10-04 DIAGNOSIS — B20 Human immunodeficiency virus [HIV] disease: Secondary | ICD-10-CM

## 2010-10-04 DIAGNOSIS — B370 Candidal stomatitis: Secondary | ICD-10-CM | POA: Diagnosis present

## 2010-10-04 DIAGNOSIS — N058 Unspecified nephritic syndrome with other morphologic changes: Secondary | ICD-10-CM | POA: Diagnosis present

## 2010-10-04 DIAGNOSIS — N2581 Secondary hyperparathyroidism of renal origin: Secondary | ICD-10-CM | POA: Diagnosis present

## 2010-10-04 DIAGNOSIS — E1049 Type 1 diabetes mellitus with other diabetic neurological complication: Secondary | ICD-10-CM | POA: Diagnosis present

## 2010-10-04 DIAGNOSIS — D509 Iron deficiency anemia, unspecified: Secondary | ICD-10-CM | POA: Diagnosis present

## 2010-10-04 DIAGNOSIS — E46 Unspecified protein-calorie malnutrition: Secondary | ICD-10-CM | POA: Diagnosis present

## 2010-10-04 DIAGNOSIS — G589 Mononeuropathy, unspecified: Secondary | ICD-10-CM | POA: Diagnosis present

## 2010-10-04 DIAGNOSIS — L03119 Cellulitis of unspecified part of limb: Secondary | ICD-10-CM

## 2010-10-04 LAB — DIFFERENTIAL
Basophils Absolute: 0 10*3/uL (ref 0.0–0.1)
Lymphocytes Relative: 6 % — ABNORMAL LOW (ref 12–46)
Lymphs Abs: 1 10*3/uL (ref 0.7–4.0)
Neutro Abs: 14.7 10*3/uL — ABNORMAL HIGH (ref 1.7–7.7)
Neutrophils Relative %: 86 % — ABNORMAL HIGH (ref 43–77)

## 2010-10-04 LAB — POCT I-STAT, CHEM 8
BUN: 20 mg/dL (ref 6–23)
Calcium, Ion: 0.99 mmol/L — ABNORMAL LOW (ref 1.12–1.32)
Chloride: 100 mEq/L (ref 96–112)
Creatinine, Ser: 4.2 mg/dL — ABNORMAL HIGH (ref 0.4–1.5)
Glucose, Bld: 486 mg/dL — ABNORMAL HIGH (ref 70–99)
HCT: 33 % — ABNORMAL LOW (ref 39.0–52.0)
Hemoglobin: 11.2 g/dL — ABNORMAL LOW (ref 13.0–17.0)
Potassium: 4.6 mEq/L (ref 3.5–5.1)
Sodium: 134 mEq/L — ABNORMAL LOW (ref 135–145)
TCO2: 20 mmol/L (ref 0–100)

## 2010-10-04 LAB — RENAL FUNCTION PANEL
Albumin: 2.1 g/dL — ABNORMAL LOW (ref 3.5–5.2)
BUN: 20 mg/dL (ref 6–23)
CO2: 21 meq/L (ref 19–32)
Calcium: 8.4 mg/dL (ref 8.4–10.5)
Chloride: 94 mEq/L — ABNORMAL LOW (ref 96–112)
Creatinine, Ser: 3.81 mg/dL — ABNORMAL HIGH (ref 0.4–1.5)
GFR calc Af Amer: 22 mL/min — ABNORMAL LOW (ref >60)
GFR calc non Af Amer: 18 mL/min — ABNORMAL LOW (ref >60)
Glucose, Bld: 475 mg/dL — ABNORMAL HIGH (ref 70–99)
Phosphorus: 4.6 mg/dL (ref 2.3–4.6)
Potassium: 4.5 mEq/L (ref 3.5–5.1)
Sodium: 132 mEq/L — ABNORMAL LOW (ref 135–145)

## 2010-10-04 LAB — SEDIMENTATION RATE: Sed Rate: 124 mm/hr — ABNORMAL HIGH (ref 0–16)

## 2010-10-04 LAB — CBC
HCT: 30.3 % — ABNORMAL LOW (ref 39.0–52.0)
Hemoglobin: 10.4 g/dL — ABNORMAL LOW (ref 13.0–17.0)
RBC: 3.39 MIL/uL — ABNORMAL LOW (ref 4.22–5.81)
RDW: 13.4 % (ref 11.5–15.5)
WBC: 17 10*3/uL — ABNORMAL HIGH (ref 4.0–10.5)

## 2010-10-04 LAB — VANCOMYCIN, RANDOM: Vancomycin Rm: 11.5 ug/mL

## 2010-10-05 ENCOUNTER — Inpatient Hospital Stay (HOSPITAL_COMMUNITY): Payer: Medicaid Other

## 2010-10-05 ENCOUNTER — Other Ambulatory Visit (HOSPITAL_COMMUNITY): Payer: Self-pay

## 2010-10-05 DIAGNOSIS — B20 Human immunodeficiency virus [HIV] disease: Secondary | ICD-10-CM

## 2010-10-05 LAB — RENAL FUNCTION PANEL
Albumin: 2 g/dL — ABNORMAL LOW (ref 3.5–5.2)
BUN: 25 mg/dL — ABNORMAL HIGH (ref 6–23)
Calcium: 8.6 mg/dL (ref 8.4–10.5)
Chloride: 98 mEq/L (ref 96–112)
Creatinine, Ser: 4.98 mg/dL — ABNORMAL HIGH (ref 0.4–1.5)

## 2010-10-05 LAB — CBC
MCH: 29.5 pg (ref 26.0–34.0)
MCHC: 33.5 g/dL (ref 30.0–36.0)
MCV: 88.2 fL (ref 78.0–100.0)
Platelets: 288 10*3/uL (ref 150–400)
RDW: 13.4 % (ref 11.5–15.5)

## 2010-10-05 LAB — T-HELPER CELLS (CD4) COUNT (NOT AT ARMC): CD4 % Helper T Cell: 13 % — ABNORMAL LOW (ref 33–55)

## 2010-10-05 LAB — CRYPTOCOCCAL ANTIGEN: Crypto Ag: NEGATIVE

## 2010-10-05 LAB — HEMOGLOBIN A1C: Hgb A1c MFr Bld: 11.1 % — ABNORMAL HIGH (ref <5.7)

## 2010-10-06 ENCOUNTER — Inpatient Hospital Stay (HOSPITAL_COMMUNITY): Payer: Medicaid Other

## 2010-10-06 LAB — RENAL FUNCTION PANEL
Albumin: 1.9 g/dL — ABNORMAL LOW (ref 3.5–5.2)
Calcium: 8.5 mg/dL (ref 8.4–10.5)
Chloride: 98 mEq/L (ref 96–112)
Creatinine, Ser: 7.28 mg/dL — ABNORMAL HIGH (ref 0.4–1.5)
GFR calc Af Amer: 10 mL/min — ABNORMAL LOW (ref >60)
GFR calc non Af Amer: 8 mL/min — ABNORMAL LOW (ref >60)

## 2010-10-06 LAB — CBC
MCH: 29.9 pg (ref 26.0–34.0)
MCHC: 33.6 g/dL (ref 30.0–36.0)
Platelets: 288 10*3/uL (ref 150–400)
RBC: 2.91 MIL/uL — ABNORMAL LOW (ref 4.22–5.81)

## 2010-10-06 LAB — GLUCOSE, CAPILLARY: Glucose-Capillary: 198 mg/dL — ABNORMAL HIGH (ref 70–99)

## 2010-10-08 LAB — GLUCOSE, CAPILLARY
Glucose-Capillary: 252 mg/dL — ABNORMAL HIGH (ref 70–99)
Glucose-Capillary: 234 mg/dL — ABNORMAL HIGH (ref 70–99)
Glucose-Capillary: 239 mg/dL — ABNORMAL HIGH (ref 70–99)
Glucose-Capillary: 247 mg/dL — ABNORMAL HIGH (ref 70–99)

## 2010-10-08 LAB — RENAL FUNCTION PANEL
Albumin: 1.7 g/dL — ABNORMAL LOW (ref 3.5–5.2)
BUN: 31 mg/dL — ABNORMAL HIGH (ref 6–23)
Chloride: 96 mEq/L (ref 96–112)
Potassium: 3.6 mEq/L (ref 3.5–5.1)
Sodium: 134 mEq/L — ABNORMAL LOW (ref 135–145)

## 2010-10-08 LAB — CBC
HCT: 26.5 % — ABNORMAL LOW (ref 39.0–52.0)
MCV: 89.5 fL (ref 78.0–100.0)
Platelets: 293 10*3/uL (ref 150–400)
RBC: 2.96 MIL/uL — ABNORMAL LOW (ref 4.22–5.81)
WBC: 12.9 10*3/uL — ABNORMAL HIGH (ref 4.0–10.5)

## 2010-10-09 ENCOUNTER — Inpatient Hospital Stay (HOSPITAL_COMMUNITY): Payer: Medicaid Other

## 2010-10-09 LAB — RENAL FUNCTION PANEL
Albumin: 1.7 g/dL — ABNORMAL LOW (ref 3.5–5.2)
Calcium: 7.5 mg/dL — ABNORMAL LOW (ref 8.4–10.5)
Creatinine, Ser: 8.88 mg/dL — ABNORMAL HIGH (ref 0.4–1.5)
GFR calc Af Amer: 8 mL/min — ABNORMAL LOW (ref >60)
GFR calc non Af Amer: 7 mL/min — ABNORMAL LOW (ref >60)
Phosphorus: 4.9 mg/dL — ABNORMAL HIGH (ref 2.3–4.6)

## 2010-10-09 LAB — WOUND CULTURE

## 2010-10-09 LAB — CBC
MCHC: 33.6 g/dL (ref 30.0–36.0)
Platelets: 285 10*3/uL (ref 150–400)
RDW: 13.4 % (ref 11.5–15.5)

## 2010-10-09 LAB — GLUCOSE, CAPILLARY: Glucose-Capillary: 338 mg/dL — ABNORMAL HIGH (ref 70–99)

## 2010-10-09 LAB — VANCOMYCIN, RANDOM: Vancomycin Rm: 16 ug/mL

## 2010-10-10 DIAGNOSIS — R627 Adult failure to thrive: Secondary | ICD-10-CM | POA: Insufficient documentation

## 2010-10-11 LAB — CULTURE, BLOOD (ROUTINE X 2)
Culture  Setup Time: 201205250137
Culture: NO GROWTH

## 2010-10-12 ENCOUNTER — Other Ambulatory Visit: Payer: Self-pay | Admitting: Licensed Clinical Social Worker

## 2010-10-12 ENCOUNTER — Other Ambulatory Visit (INDEPENDENT_AMBULATORY_CARE_PROVIDER_SITE_OTHER): Payer: Medicaid Other

## 2010-10-12 ENCOUNTER — Telehealth: Payer: Self-pay | Admitting: *Deleted

## 2010-10-12 ENCOUNTER — Other Ambulatory Visit: Payer: Self-pay | Admitting: Infectious Diseases

## 2010-10-12 DIAGNOSIS — B20 Human immunodeficiency virus [HIV] disease: Secondary | ICD-10-CM

## 2010-10-12 LAB — T-HELPER CELL (CD4) - (RCID CLINIC ONLY): CD4 T Cell Abs: 150 uL — ABNORMAL LOW (ref 400–2700)

## 2010-10-12 NOTE — Discharge Summary (Signed)
Alvin Daniels, Alvin Daniels      ACCOUNT NO.:  [id number]  MEDICAL RECORD NO.:  [id number]           PATIENT TYPE:  I  LOCATION:  [id number]                         FACILITY:  [facility]  PHYSICIAN:  Talbert Cage, M.D.DATE OF BIRTH:  10-23-1970  DATE OF ADMISSION:  10/04/2010 DATE OF DISCHARGE:  10/09/2010                              DISCHARGE SUMMARY   PRIMARY CARE PROVIDER:  [facility].  DISCHARGE DIAGNOSES: 1. Right knee cellulitis. 2. End-stage renal disease on hemodialysis. 3. Type 2 diabetes. 4. Human immunodeficiency virus/acquired immune deficiency syndrome. 5. Anemia of chronic disease. 6. Thrush. 7. Hepatitis C.  DISCHARGE MEDICATIONS: 1. Ciprofloxacin 500 mg p.o. b.i.d. x2 weeks. 2. Darbepoetin 150 mcg IV, Saturday hemodialysis. 3. Ensure 1 p.o. t.i.d. 4. Ferrous gluconate 621/2 mg IV Thursday hemodialysis. 5. Insulin 70/30, 15 units q.a.m. and 12 units q.p.m. subcu. 6. Epivir 25 mg p.o. daily. 7. Megace 800 mg p.o. daily. 8. Neosporin ointment one application topically daily as needed. 9. Nepro with carb p.o. t.i.d. p.r.n. 10.Protein supplement 1 p.o. q.i.d. 11.Isentress 400 mg p.o. b.i.d. 12.Bactrim 80/400 p.o. Tuesday, Thursday, and Saturday after dialysis. 13.Vancomycin 500 mg IV Tuesday, Thursday, and Saturday at dialysis x2     weeks. 14.Retrovir 300 mg p.o. daily. 15.Gabapentin 600 mg p.o. t.i.d. 16.Tylenol 650 mg p.o. q.6 h p.r.n. 17.Protonix 40 mg p.o. daily. 18.Renal vitamin 1 tablet p.o. nightly.  CONSULTS: 1. Dr. Linus Salmons  with Infectious Disease. 2. Sherril Croon, M.D. with Renal.  PROCEDURES:  None.  LABORATORY DATA:  The day of admission, the patient's CBC showed a white blood cell count of 17, hemoglobin of 11.2, platelets of 301.  The day of discharge WBC 8.9, hemoglobin 8.7, platelets to 85.  BMET remained stable.  Hemoglobin A1c 11.1.  Hep B surface antigen negative. Cryptococcal antigen negative.  CD-4 count 130, T helper cell  percentage 13%.  Blood cultures negative x2.  Wound culture was staph aureus.  BRIEF HOSPITAL COURSE:  This is a 40 year old male with ESRD, diabetes, HIV presenting with right knee cellulitis. 1. Right knee cellulitis.  The patient initially presented after 2     doses of vancomycin with hemodialysis as started by Renal.  The     patient had continued redness and swelling around the right knee     wound area as well as increasing pain and decreasing range of     motion.  The patient was brought into vancomycin and Zosyn at the     time of admission.  Zosyn was initially discontinued as the patient     appeared to be responding well to the vancomycin alone that he     already had been getting, however, after he had a fever while on     vancomycin alone.  He was re-brought into Zosyn.  Wound culture was     obtained which grew out staph aureus, resistant only to     clindamycin, erythromycin and penicillin.  Sensitive to gentamicin,     oxacillin, rifampin, Bactrim, vancomycin, tetracycline and     moxifloxacin.  After discussion with the infectious disease     specialist, it was decided to continue the patient on both a 2-week  course of vancomycin and ciprofloxacin.  Even though likely the     culture is predominantly staph aureus, it was felt that the patient     did improve with some gram negative coverage, and it would be     beneficial to the patient to continue this coverage on an     outpatient basis for 2-week course.  The patient did not require a     debridement with surgery, however, if this does not appear to be     clearing well in the next 42 weeks then it should be considered as     the patient will likely be a poor wound healer with a combination     of his diabetes, end-stage renal disease and HIV/AIDS.  On the day     of discharge, the patient had full range of motion in his knee with     significantly improved erythema and edema around the area.  In     addition,  there were no further areas of fluctuance, and the     patient appeared to have a wet healing scab.  The patient was also     afebrile for greater than 48 hours at the time of discharge. 2. End-stage renal disease.  The patient was continued on his Tuesday,     Thursday, Saturday hemodialysis.  It was discussed with Dr. Justin Mend     prior to discharge.  The patient will continue receiving his     vancomycin as well as his Bactrim prophylaxis at hemodialysis. 3. HIV/AIDS.  CD-4 count is 130.  ID was involved and restarted the     patient on heart therapy.  He had been off of this for at least     approximately 1 month as he could not make it to a followup     appointment.  ID restarted medications and also began Bactrim     prophylaxis as the patient's CD-4 count is less than 200.  The ID     Clinic will call the patient and he is to follow up with them in     order to continue receiving this heart treatment. 4. Type 2 diabetes.  The patient's hemoglobin A1c was 11.1 on     admission.  The patient was obviously not well controlled prior to     admission.  His 70/30 insulin was increased to 15 units q.a.m. and     12 units q.p.m. prior to the day of discharge.  CBGs were difficult     to control, however, prior to discharge they were ranging mostly in     the low 200s.  Tighter glycemic control should be worked on as an     outpatient. 5. Anemia.  The patient's baseline hemoglobin appears to be anywhere     between 8 and 10.  Anemia was stable.  He is to continue Aranesp at     hemodialysis.  Renal is following this problem.  DISCHARGE INSTRUCTIONS:  The patient was instructed to increase activity slowly and that he may bathe, however, he is to leave the DuoDERM dressing on x1 week and should avoid full shower until after that time. In addition, he was encouraged to drink Ensure 3 times a day in order to improve his nutritional status.  He was also instructed to keep all of his dialysis  appointments and to keep his followup appointments with the Infectious Disease physician.  FOLLOWUP:  The patient is to follow up with  [facility] in the next 2-4 weeks.  In addition, Dr. Linus Salmons with Infectious Disease will call the patient with an appointment in the next few weeks.  DISCHARGE CONDITION:  The patient was discharged to home in stable medical condition with understanding of the importance of continuing dialysis and taking all of his medications as prescribed.    ______________________________ Alvin Glass, MD   ______________________________ Talbert Cage, M.D.    JM/MEDQ  D:  10/10/2010  T:  10/10/2010  Job:  [id number]  cc:   [facility]  Electronically Signed by Alvin Glass MD on 10/10/2010 10:19:13 AM Electronically Signed by Talbert Cage M.D. on 10/12/2010 01:43:06 PM

## 2010-10-12 NOTE — Telephone Encounter (Signed)
Call placed to Dialysis [phone] to ask if patient received Vancomycin on 10/11/10.  Patient did receive and has appointment 10/13/10. Myrtis Hopping CMA

## 2010-10-12 NOTE — H&P (Signed)
NAMEGARLAND, Alvin Daniels      ACCOUNT NO.:  [id number]  MEDICAL RECORD NO.:  [id number]           PATIENT TYPE:  I  LOCATION:  [id number]                         FACILITY:  [facility]  PHYSICIAN:  Talbert Cage, M.D.DATE OF BIRTH:  1970-12-18  DATE OF ADMISSION:  10/04/2010 DATE OF DISCHARGE:                             HISTORY & PHYSICAL   PRIMARY CARE PROVIDER:  [facility].  CHIEF COMPLAINT:  Right knee cellulitis.  HISTORY OF PRESENT ILLNESS:  This is a 40 year old Hispanic male with known HIV and end-stage renal disease on hemodialysis presenting with a 4-day history of redness, swelling, and pain in his right knee.  The patient has had no fevers, nausea, vomiting, or diarrhea.  He does endorse some chills.  The patient went to dialysis today at which time someone noticed a large scab on his knee as well as redness to his mid thigh and told him that he should be seen for this, so the patient came to the emergency department.  The patient does have a history of uncontrolled type 2 diabetes in addition to HIV and ESRD.  The patient complains only of this knee pain, especially pain with bending his right knee as well as chills.  The patient complains of a bitemporal headache as well as some jaw pain or claudication with chewing.  Otherwise, the patient's only other complaint is some right-sided back pain.  No dysuria, no cough, no sweats, no weakness, no change in sensation in his lower extremities.  PAST MEDICAL HISTORY: 1. HIV, last CD-16 June 2010 of 320. 2. Uncontrolled diabetes. 3. ESRD on hemodialysis Tuesday, Thursday, and Saturday. 4. Anemia.  ALLERGIES:  No known drug allergies.  MEDICATIONS: 1. Neurontin 2. Novolin 70/30. 3. Omeprazole.  The patient is unsure of other medications at this time.  PAST SURGICAL HISTORY:  AV fistula in November 2011.  SOCIAL HISTORY:  The patient lives alone in [place].  He does have family, including one daughter in  [place].  He denies tobacco, alcohol, or drug use.  FAMILY HISTORY:  There is a family history of diabetes possibly in his mother, definitely in his father and grandmother.  The patient's father is deceased.  He died suddenly after being struck by the lightning.  The patient has one brother who is healthy.  PHYSICAL EXAMINATION:  VITAL SIGNS:  Temperature 98.3, pulse 87, respirations 14, blood pressure 84/47, pO2 of 96% on room air. GENERAL:  No acute distress, very pleasant gentleman. HEENT:  Moist mucous membranes.  Extraocular movements intact.  PERRLA. No pharyngeal erythema. NECK:  No cervical lymphadenopathy. CARDIOVASCULAR:  Regular rate and rhythm.  No murmur. LUNGS:  Clear to auscultation bilaterally.  No wheezes, rhonchi, or crackles. ABDOMEN:  Soft, nontender, nondistended.  Positive bowel sounds. BACK:  Tenderness to palpation along the right side of back from scapula to pelvic bone.  No focal areas of increased tenderness.  No tenderness to palpation along the spine.  No obvious deformities. EXTREMITIES:  Warm and well-perfused, 2+ pedal pulses, erythema on the right leg.  In the distal one third of the thigh over the knee to the proximal half of the shin, pain with movement, possible small effusion on exam,  erythematous area is also warm and tender to palpation.  No edema on bilateral lower extremities.  Left lower extremity within normal limits.  LABS AND STUDIES:  X-ray of right knee showing osteopenia, no overt disk fraction or fracture.  Recommend MRI with contrast to evaluate for possible osteomyelitis.  CBC 17.0/10.4/30.3/301, 86% neutrophils. Sodium 134, potassium 4.6, chloride 100, BUN 20, creatinine 4.2, glucose 486, lactic acid 1.6.  Pending lab tests include ESR and blood cultures.  ASSESSMENT AND PLAN:  This is a 40 year old male with history of end- stage renal disease and HIV presenting with right lower extremity cellulitis and possible joint  involvement. 1. Right knee cellulitis.  Obvious cellulitis with concern for     possible effusion.  We will start vancomycin and Zosyn to cover for     Pseudomonas, MRSA, and anaerobes.  The patient does have diabetes.     We will also check blood cultures prior to antibiotics and have a     wound care consult for this area.  We will get an MRI without     contrast to evaluate for possible osteomyelitis, effusion, and/or     fluid collection.  We will not use any contrast as the patient does     have end-stage renal disease and there is concern for nephrogenic     systemic sclerosis with the gadolinium.  This was discussed with     Radiology and it was determined that an MRI without contrast would     still be useful.  We will monitor for worsening erythema, worsening     pain or fevers.  We will give Tylenol p.r.n. fever and pain as well     as Percocet for pain.  The patient will likely need 48 hours of     inpatient IV antibiotics while awaiting for blood cultures;     however, if patient is still requiring vancomycin at discharge, he     can get this with dialysis. 2. ESRD.  Dr. Justin Mend with [facility] has been made aware.  He will     see the patient in the morning.  We will check a renal function     panel now and again in the morning.  We will continue the patient's     Tuesday, Thursday, Saturday dialysis schedule.  The patient was     last dialyzed today and does not appear to be fluid overloaded or     have any electrolytes with the need for emergent dialysis. 3. HIV.  The patient's last CD-4 count in February 2012 was 320.  We     will check a CD-4 count now.  The patient missed his new patient     appointment at the ID clinic earlier this year.  We will get an     inpatient ID consult for medication recommendations including HAART     therapy. 4. Type 2 diabetes.  The patient's last A1c was 10.2 in October 2012,     which had decreased from 15 in May 2012.  We will recheck an  A1c at     this time.  The patient is possibly on 70/30 at home.  On his last     discharge, he was discharged on Lantus 15 units.  We will start     Lantus 10 units at bedtime and sliding scale insulin until med rec     is complete.  We will adjust his Lantus of 70/30 as necessary.  We  will also continue him on his home Neurontin. 5. Anemia.  Baseline hemoglobin is unclear, it appears to be 10 to 11;     however, in November 2011 it was in the 7 to 8.  Hemoglobin is     stable at 10.4 at this time.  We will continue to monitor. 6. Jaw pain/claudication.  TMJ versus temporal arteritis are most     likely diagnoses.  We will need outpatient workup for this as ESR     which is the diagnostic test for temporal arteritis will be     unhelpful at this time in the setting of an acute infection. 7. Fluid, electrolytes, and nutrition/gastrointestinal.  We will     saline lock IV.  Start renal 80/90 diet with a 1200     mL fluid restriction. 8. Prophylaxis.  We will give Protonix and Lovenox.  Dosing per     pharmacy. 9. Disposition pending clinical improvement.    ______________________________ Lorin Glass, MD   ______________________________ Talbert Cage, M.D.    JM/MEDQ  D:  10/04/2010  T:  10/05/2010  Job:  [id number]  Electronically Signed by Lorin Glass MD on 10/05/2010 10:16:52 AM Electronically Signed by Talbert Cage M.D. on 10/12/2010 01:42:15 PM

## 2010-10-13 ENCOUNTER — Telehealth: Payer: Self-pay | Admitting: Internal Medicine

## 2010-10-13 LAB — COMPLETE METABOLIC PANEL WITH GFR
ALT: 9 U/L (ref 0–53)
CO2: 27 mEq/L (ref 19–32)
Creat: 5.45 mg/dL — ABNORMAL HIGH (ref 0.50–1.35)
GFR, Est African American: 14 mL/min — ABNORMAL LOW (ref >60)
GFR, Est Non African American: 12 mL/min — ABNORMAL LOW (ref >60)
Total Bilirubin: 0.4 mg/dL (ref 0.3–1.2)

## 2010-10-13 LAB — CBC WITH DIFFERENTIAL/PLATELET
Eosinophils Absolute: 0.1 10*3/uL (ref 0.0–0.7)
Eosinophils Relative: 1 % (ref 0–5)
Lymphs Abs: 1.1 10*3/uL (ref 0.7–4.0)
MCH: 29.5 pg (ref 26.0–34.0)
MCV: 95.3 fL (ref 78.0–100.0)
Platelets: 346 10*3/uL (ref 150–400)
RBC: 3.42 MIL/uL — ABNORMAL LOW (ref 4.22–5.81)
RDW: 14.2 % (ref 11.5–15.5)

## 2010-10-13 LAB — CULTURE, BLOOD (ROUTINE X 2): Culture  Setup Time: 201205271122

## 2010-10-13 NOTE — Telephone Encounter (Signed)
Called at 5:30 am with a critical value: BG  In 600's. Will notify day-time team on call to contact the patient after 7 am today. Also, will have [facility] contact the patient on 10/14/10 for an appointment.

## 2010-10-15 LAB — HIV-1 RNA QUANT-NO REFLEX-BLD
HIV 1 RNA Quant: 181 copies/mL — ABNORMAL HIGH (ref <20)
HIV-1 RNA Quant, Log: 2.26 {Log} — ABNORMAL HIGH (ref <1.30)

## 2010-10-16 ENCOUNTER — Ambulatory Visit: Payer: Self-pay | Admitting: Internal Medicine

## 2010-10-16 ENCOUNTER — Telehealth: Payer: Self-pay | Admitting: *Deleted

## 2010-10-16 NOTE — Telephone Encounter (Signed)
Alvin Daniels called the patients dialysis center to see if the patient was getting his Vancomycin as prescribed and was told yes he will be getting the dose at his visits.

## 2010-10-19 ENCOUNTER — Telehealth: Payer: Self-pay | Admitting: *Deleted

## 2010-10-19 ENCOUNTER — Encounter: Payer: Self-pay | Admitting: Adult Health

## 2010-10-19 ENCOUNTER — Ambulatory Visit (INDEPENDENT_AMBULATORY_CARE_PROVIDER_SITE_OTHER): Payer: Self-pay | Admitting: Adult Health

## 2010-10-19 DIAGNOSIS — N186 End stage renal disease: Secondary | ICD-10-CM

## 2010-10-19 DIAGNOSIS — A4101 Sepsis due to Methicillin susceptible Staphylococcus aureus: Secondary | ICD-10-CM

## 2010-10-19 DIAGNOSIS — L97909 Non-pressure chronic ulcer of unspecified part of unspecified lower leg with unspecified severity: Secondary | ICD-10-CM

## 2010-10-19 DIAGNOSIS — L02419 Cutaneous abscess of limb, unspecified: Secondary | ICD-10-CM

## 2010-10-19 DIAGNOSIS — E1149 Type 2 diabetes mellitus with other diabetic neurological complication: Secondary | ICD-10-CM

## 2010-10-19 DIAGNOSIS — E1129 Type 2 diabetes mellitus with other diabetic kidney complication: Secondary | ICD-10-CM

## 2010-10-19 DIAGNOSIS — L03115 Cellulitis of right lower limb: Secondary | ICD-10-CM

## 2010-10-19 DIAGNOSIS — E11622 Type 2 diabetes mellitus with other skin ulcer: Secondary | ICD-10-CM

## 2010-10-19 DIAGNOSIS — E1169 Type 2 diabetes mellitus with other specified complication: Secondary | ICD-10-CM

## 2010-10-19 DIAGNOSIS — R636 Underweight: Secondary | ICD-10-CM

## 2010-10-19 DIAGNOSIS — B1920 Unspecified viral hepatitis C without hepatic coma: Secondary | ICD-10-CM

## 2010-10-19 DIAGNOSIS — Z113 Encounter for screening for infections with a predominantly sexual mode of transmission: Secondary | ICD-10-CM

## 2010-10-19 DIAGNOSIS — Z21 Asymptomatic human immunodeficiency virus [HIV] infection status: Secondary | ICD-10-CM

## 2010-10-19 DIAGNOSIS — E1142 Type 2 diabetes mellitus with diabetic polyneuropathy: Secondary | ICD-10-CM

## 2010-10-19 DIAGNOSIS — B20 Human immunodeficiency virus [HIV] disease: Secondary | ICD-10-CM

## 2010-10-19 MED ORDER — GABAPENTIN 600 MG PO TABS: 600.0000 mg | ORAL_TABLET | Freq: Three times a day (TID) | ORAL | Status: DC

## 2010-10-19 MED ORDER — GLUCERNA PO LIQD: 1.0000 | Freq: Three times a day (TID) | ORAL | Status: DC

## 2010-10-19 MED ORDER — SULFAMETHOXAZOLE-TRIMETHOPRIM 400-80 MG PO TABS: ORAL_TABLET | ORAL | Status: DC

## 2010-10-19 NOTE — Telephone Encounter (Signed)
Chantelle from [facility] called [phone]) asking where he is to get his HIV meds filled. He has an orange card. Gave her Barbara's number & asked that he make an appt to apply for ADAP. Can then get meds if approved. If not, we can complete Pt assistance program for needed drugs. They will fill his HTN meds & he will continue at [facility] for primary care. Asked that he arrange this asap & bring an interpretor if needed Per NP he needs a glucose meter. I called Chantelle back about this. She will see that he gets one. Pamala Hurry is aware that he will be calling for an appt

## 2010-10-19 NOTE — Progress Notes (Signed)
Subjective:    Patient ID: Alvin Daniels, male    DOB: [birth year], 40 y.o.   MRN: [id number]  HPI 40 year old, Hispanic male presents to clinic for evaluation and ongoing care of his HIV. According to records. He was diagnosed with HIV in 2011 and was followed for this by providers at [facility]. He also has a history of insulin-dependent type 2 diabetes and end-stage renal disease, and recently, he was discharged from the hospital for extensive cellulitis to the right lower extremity with a draining wound. He obtains dialysis every Tuesday-Thursday-Saturday and received IV vancomycin during his dialysis. He states that he has not had any HIV medications for "sometime" as a result of "the pharmacy not delivering medications to be more." Records reveal he recently had Medicaid and he allowed this to labs and did not follow through with reapplication. His chief complaint today remains chronic pain, and "tiredness" to his feet. States the symptoms are much less following his dialysis, but worsen progressively between dialysis treatments. He also has been taking his insulin 70/30 without coverage and does not have a glucometer to measure. CBGs. He relates that the pain and swelling and drainage to the wound has improved remarkably since last week. He denies any fevers, cough, or shortness of breath. His original antiretroviral regimen included raltegravir, lamivudine, and zidovudine.  He was also to be taking TMP/MSX 80/400 after each dialysis treaatment, but he was unaware of this and has not been taking this.  Then only medications he claims he currently takes is po ciprofloxacin and Protonix.   Review of Systems  Constitutional: Positive for activity change and fatigue. Negative for fever, chills, diaphoresis, appetite change and unexpected weight change.  HENT: Negative.  Negative for hearing loss, ear pain, nosebleeds, congestion, sore throat, facial swelling, rhinorrhea, sneezing, drooling,  mouth sores, trouble swallowing, neck pain, neck stiffness, dental problem, voice change, postnasal drip, sinus pressure, tinnitus and ear discharge.   Eyes: Negative.  Negative for photophobia, pain, discharge, redness, itching and visual disturbance.  Respiratory: Negative.  Negative for apnea, cough, choking, chest tightness, shortness of breath, wheezing and stridor.   Cardiovascular: Negative.  Negative for chest pain, palpitations and leg swelling.  Gastrointestinal: Negative.  Negative for nausea, vomiting, abdominal pain, diarrhea, constipation, blood in stool, abdominal distention, anal bleeding and rectal pain.  Genitourinary: Negative.  Negative for dysuria, urgency, frequency, hematuria, flank pain, decreased urine volume, discharge, penile swelling, scrotal swelling, enuresis, difficulty urinating, genital sores, penile pain and testicular pain.  Musculoskeletal: Positive for gait problem. Negative for myalgias, back pain, joint swelling and arthralgias.  Skin: Positive for rash and wound. Negative for color change and pallor.  Neurological: Positive for weakness and numbness. Negative for dizziness, tremors, seizures, syncope, facial asymmetry, speech difficulty, light-headedness and headaches.  Hematological: Negative.  Negative for adenopathy. Does not bruise/bleed easily.  Psychiatric/Behavioral: Negative.  Negative for suicidal ideas, hallucinations, behavioral problems, confusion, sleep disturbance, self-injury, dysphoric mood, decreased concentration and agitation. The patient is not nervous/anxious and is not hyperactive.        Objective:   Physical Exam  Constitutional: He is oriented to person, place, and time. He appears well-developed. No distress.       Underweight-appearing  HENT:  Head: Normocephalic and atraumatic.  Right Ear: External ear normal.  Left Ear: External ear normal.  Nose: Nose normal.  Mouth/Throat: Oropharynx is clear and moist. No oropharyngeal  exudate.  Eyes: Conjunctivae and EOM are normal. Pupils are equal, round, and reactive to light. Right  eye exhibits no discharge. Left eye exhibits no discharge. No scleral icterus.  Neck: Normal range of motion. Neck supple. No JVD present. No tracheal deviation present. No thyromegaly present.  Cardiovascular: Normal rate, regular rhythm, normal heart sounds and intact distal pulses.   Pulmonary/Chest: Effort normal and breath sounds normal. No stridor. No respiratory distress. He has no wheezes. He has no rales. He exhibits no tenderness.  Abdominal: Soft. Bowel sounds are normal. He exhibits no distension and no mass. There is no tenderness. There is no rebound and no guarding.  Musculoskeletal: He exhibits tenderness. He exhibits no edema.       Tenderness noted. Regions of cellulitis to the lower extremity on the right  Lymphadenopathy:    He has no cervical adenopathy.  Neurological: He is alert and oriented to person, place, and time. No cranial nerve deficit. He exhibits normal muscle tone. Coordination normal.  Skin: Skin is warm and dry. He is not diaphoretic. There is erythema.       Diminish swelling, but noticeable erythema and dry, flaking skin noted to be right lower extremity with a 2 x 3" granulating ulcer in the pretibial region just inferior to the right knee. The wound is draining serosanguineous fluid. No necrosis or purulent drainage noted.   multiple old wounds noted to the left lower leg some with scabbing. Other's with well demarcated, scarring.  Psychiatric: His speech is normal and behavior is normal. Judgment and thought content normal. His affect is blunt. Cognition and memory are normal.          Assessment & Plan:  Diabetic leg ulcer Healing well. Continue present management  DIABETES MELLITUS, TYPE II, UNCONTROLLED, W/RENAL COMPS Needs to continue insulin therapy. We'll refer him for medical assistance in obtaining his insulin through his bridge  counselor  RENAL FAILURE, END STAGE Continue HD followup with nephrology  METHICILLIN SUSCEPTIBLE [place] to have cleared. Infection. Continue to monitor  HIV INFECTION Given his state of multiple comorbidities, treatment was warranted. While he was in the hospital. Currently on Darunavir, Norvir, tenofovir, and Emtriva. He is also on Bactrim for PCP prophylaxis. We will keep on his current regimen and repeat staging labs in 4 weeks, but do to his multiple health issues. We would like to see him again in 1 week.Marland Kitchen  POLYNEUROPATHY IN DIABETES On gabapentin therapy. Continue present management

## 2010-10-19 NOTE — Patient Instructions (Signed)
Continuing daily dressing changes of sterile saline soaked gauze and a wet to dry fashion. Take Bactrim one dose 3 times a week after each dialysis. Take gabapentin one tablet 3 times a day for pain in the feet. Continue taking oral antibiotics until gone. When we are clear on Medicare/Medicaid status, we will restart, your HIV medications. Cuidado Fritzi Mandes Herida (Wound Care) Un buen cuidado de las heridas le ayudar a Glass blower/designer y prevenir infecciones. Las heridas por perforacin y los cortes profundos tienden a Mudlogger en mayor medida que las raspaduras y los rasguos. Existen mayores posibilidades de infeccin si la persona daada es diabtica o tiene un sistema inmunolgico debilitado (por ejemplo, cuando un paciente con cncer est siendo tratado con quimioterapia o radiacin). Ceredo cuidados generales para el tratamiento de una herida son:  [place] y en reposo la zona lesionada hasta que el dolor y la hinchazn mejoren.   Debe retirar y lavar todo cuerpo extrao y suciedad.   Las heridas deben limpiarse diariamente con jabn neutro y [place].   Podra necesitar un vendaje para proteger la herida de un dao mayor.   Aplique cremas o pomadas antibiticas una vez lavada la herida.  SOLICITE ATENCIN MDICA SI COMIENZA A SENTIR:  Aumento de enrojecimiento o hinchazn alrededor de la herida.   Aumento del dolor.  [place] DE INMEDIATO SI PRESENTA:  Una temperatura de 102 grados Fahrenheit (38.8 grados Celsius) o mayor.   Aparece un drenaje similar al pus en la herida.   Dolores fuertes que no se calman con analgsicos recetados o no.   Incapacidad para mover los dedos de la mano o el pie si las heridas afectan esas reas.   Vetas rojizas en la piel que se extienden por encima o por debajo de la herida.  Debe aplicarse la vacuna contra el ttanos si no la ha recibido en los ltimos 5 aos. Document Released:  04/29/2005 Document Re-Released: 02/24/2007 [facility] Patient Information 2011 [place].

## 2010-10-26 ENCOUNTER — Ambulatory Visit: Payer: Self-pay

## 2010-10-26 ENCOUNTER — Encounter: Payer: Self-pay | Admitting: Adult Health

## 2010-10-26 ENCOUNTER — Ambulatory Visit: Payer: Self-pay | Admitting: Internal Medicine

## 2010-10-26 ENCOUNTER — Ambulatory Visit: Payer: Self-pay | Admitting: Adult Health

## 2010-10-26 ENCOUNTER — Ambulatory Visit (INDEPENDENT_AMBULATORY_CARE_PROVIDER_SITE_OTHER): Payer: Self-pay | Admitting: Adult Health

## 2010-10-26 VITALS — BP 119/79 | HR 96 | Temp 97.4°F | Ht 62.5 in | Wt 104.0 lb

## 2010-10-26 DIAGNOSIS — L97909 Non-pressure chronic ulcer of unspecified part of unspecified lower leg with unspecified severity: Secondary | ICD-10-CM

## 2010-10-26 DIAGNOSIS — B20 Human immunodeficiency virus [HIV] disease: Secondary | ICD-10-CM

## 2010-10-26 DIAGNOSIS — N186 End stage renal disease: Secondary | ICD-10-CM

## 2010-10-26 DIAGNOSIS — E119 Type 2 diabetes mellitus without complications: Secondary | ICD-10-CM

## 2010-10-26 DIAGNOSIS — E11622 Type 2 diabetes mellitus with other skin ulcer: Secondary | ICD-10-CM

## 2010-10-26 DIAGNOSIS — E1169 Type 2 diabetes mellitus with other specified complication: Secondary | ICD-10-CM

## 2010-10-26 MED ORDER — INSULIN SYRINGES (DISPOSABLE) U-100 0.5 ML MISC: Status: DC

## 2010-10-26 MED ORDER — INSULIN ASPART PROT & ASPART (70-30 MIX) 100 UNIT/ML ~~LOC~~ SUSP: SUBCUTANEOUS | Status: DC

## 2010-10-26 MED ORDER — ALCOHOL PREP SWABS PADS: MEDICATED_PAD | Status: DC

## 2010-10-27 LAB — HEMOGLOBIN A1C: Hgb A1c MFr Bld: 9.7 % — ABNORMAL HIGH (ref <5.7)

## 2010-10-29 NOTE — Consult Note (Signed)
NAMESYRE, TRITTEN      ACCOUNT NO.:  [id number]  MEDICAL RECORD NO.:  [id number]          PATIENT TYPE:  LOCATION:                                 FACILITY:  PHYSICIAN:  Thayer Headings, MD    DATE OF BIRTH:  20-Sep-1970  DATE OF CONSULTATION: DATE OF DISCHARGE:                                CONSULTATION   REASON FOR CONSULTATION:  Management of HIV and cellulitis.  HISTORY OF PRESENT ILLNESS:  This is a 40 year old Hispanic male with a history of end-stage renal disease on hemodialysis, diabetes, and HIV, diagnosed approximately 1 year ago with the HIV and the renal failure, sent him from his dialysis unit due to right knee swelling and what appeared to be an infected lesion.  The patient states that his right knee has been getting worse over the last few weeks and developed into an abrasion and has had some pus draining.  He denies any fever or chills.  Additional complaints include difficulty chewing with bilateral of his jaw that occurs intermittently, also he states that he has episodes where when he gets up from bed in the morning or when he is lying down and getting up to a sitting or standing position he has a pain in the back of his head and some vision changes and passes out.  He states this has been going on particularly for the last few weeks as well.  He also reports that he has had difficulty with access for food, although he does have a place to live.  PAST MEDICAL HISTORY: 1. HIV, AIDS. 2. End-stage renal disease secondary to either diabetes or HIVAN. 3. Right knee infection.  MEDICATIONS:  HIV medications.  The patient was previously on efavirenz, lamivudine, and zidovudine, but has not had any medications for at least a month.  He states he ran out.  Other medications include Lantus insulin for his diabetes.  ALLERGIES:  No known drug allergies.  SOCIAL HISTORY:  The patient is unemployed, does not have legal status in the country and has no  apparent insurance.  He denies alcohol, tobacco, or drug use.  FAMILY HISTORY:  Notable for diabetes.  REVIEW OF SYSTEMS:  A 12-point review of systems was obtained and was negative except as per the history of present illness.  PHYSICAL EXAMINATION:  VITAL SIGNS:  T-max is 100.5, pulse 94, respirations 19, and blood pressure 91/50. GENERAL:  The patient is awake, alert, and oriented x3 and appears in no acute distress to me. HEENT:  Mild thrush and anicteric.  No cervical lymphadenopathy. CARDIOVASCULAR:  Regular rate and rhythm.  No murmurs, rubs, or gallops. LUNGS:  Clear to auscultation bilaterally. ABDOMEN:  Soft, nontender, and nondistended.  Positive bowel sounds.  No hepatosplenomegaly. EXTREMITIES:  Right knee with notable swelling and purulent discharge at the site of the lesion, excessive erythema, and some tenderness. SKIN:  Otherwise with some hyperpigmented lesions and other scratches and abrasions.  LABORATORY DATA:  CD-4 count of 130.  WBC is 14.6 which is down from the initial WBC of 17.0.  Hemoglobin A1c is 11.1.  MRSA screen by PCR is negative.  X-ray does not show any particular osteomyelitis,  but does show osteopenia.  ASSESSMENT AND PLAN: 1. Human immunodeficiency virus, acquired immune deficiency syndrome:     The patient does have CD-4 count under 200.  As he has been on     efavirenz in the past, this likely would be a poor choice for him     with likely resistance now.  We will continue him with zidovudine     and lamivudine and change his efavirenz to raltegravir 600 mg twice     a day.  Additionally, he will need Bactrim prophylaxis. 2. Thrush:  We will start him on fluconazole. 3. Right knee infection:  It is draining and this should be cultured     for bacterial and Gram stain.  Though diabetic certainly is a risk     for anaerobic and other serious infections in someone with     dialysis, we will hold on Zosyn at this time, just continue with      vancomycin.  He has had a MSSA Staph aureus in the past and so     staph is certainly high in the differential at this time.  Though     he declines, Zosyn can be added back in. 4. Neurologic symptoms:  The patient does complain of symptoms that     are concerning for increased intracranial pressure.  I will have a     CT ordered and a cryptococcal antigen of the serum to see if there     is any concern for cryptococcal disease in this patient with a low     CD-4 count. 5. Social work:  The patient does have difficulty getting food and     other things that he needs and therefore Social Worker has been     called by the Primary team.  I will also discuss this with the HIV     Clinic.     Thayer Headings, MD     RWC/MEDQ  D:  10/05/2010  T:  10/06/2010  Job:  [id number]  Electronically Signed by Scharlene Gloss MD on 10/29/2010 11:10:05 AM

## 2010-11-09 ENCOUNTER — Ambulatory Visit: Payer: Self-pay | Admitting: Adult Health

## 2010-11-22 ENCOUNTER — Ambulatory Visit: Payer: Self-pay | Admitting: Adult Health

## 2010-11-23 ENCOUNTER — Other Ambulatory Visit (HOSPITAL_COMMUNITY): Payer: Self-pay | Admitting: Nephrology

## 2010-11-23 ENCOUNTER — Emergency Department (HOSPITAL_COMMUNITY): Payer: Medicaid Other

## 2010-11-23 ENCOUNTER — Inpatient Hospital Stay (HOSPITAL_COMMUNITY)
Admission: EM | Admit: 2010-11-23 | Discharge: 2010-11-24 | DRG: 639 | Disposition: A | Discharge status: Home / Self Care | Payer: Medicaid Other | Attending: Internal Medicine | Admitting: Internal Medicine

## 2010-11-23 ENCOUNTER — Ambulatory Visit (INDEPENDENT_AMBULATORY_CARE_PROVIDER_SITE_OTHER): Payer: Self-pay | Admitting: Adult Health

## 2010-11-23 ENCOUNTER — Encounter (HOSPITAL_COMMUNITY): Payer: Self-pay | Admitting: Radiology

## 2010-11-23 DIAGNOSIS — Z992 Dependence on renal dialysis: Secondary | ICD-10-CM

## 2010-11-23 DIAGNOSIS — E119 Type 2 diabetes mellitus without complications: Secondary | ICD-10-CM

## 2010-11-23 DIAGNOSIS — N186 End stage renal disease: Secondary | ICD-10-CM

## 2010-11-23 DIAGNOSIS — Z21 Asymptomatic human immunodeficiency virus [HIV] infection status: Secondary | ICD-10-CM | POA: Diagnosis present

## 2010-11-23 DIAGNOSIS — D638 Anemia in other chronic diseases classified elsewhere: Secondary | ICD-10-CM | POA: Diagnosis present

## 2010-11-23 DIAGNOSIS — E1169 Type 2 diabetes mellitus with other specified complication: Principal | ICD-10-CM | POA: Diagnosis present

## 2010-11-23 DIAGNOSIS — E11649 Type 2 diabetes mellitus with hypoglycemia without coma: Secondary | ICD-10-CM

## 2010-11-23 DIAGNOSIS — B20 Human immunodeficiency virus [HIV] disease: Secondary | ICD-10-CM

## 2010-11-23 DIAGNOSIS — Z8614 Personal history of Methicillin resistant Staphylococcus aureus infection: Secondary | ICD-10-CM

## 2010-11-23 HISTORY — DX: Methicillin resistant Staphylococcus aureus infection, unspecified site: A49.02

## 2010-11-23 HISTORY — DX: Unspecified viral hepatitis C without hepatic coma: B19.20

## 2010-11-23 HISTORY — DX: Disorder of kidney and ureter, unspecified: N28.9

## 2010-11-23 LAB — CBC
HCT: 40.1 % (ref 39.0–52.0)
Hemoglobin: 13.5 g/dL (ref 13.0–17.0)
MCH: 30.5 pg (ref 26.0–34.0)
MCHC: 33.7 g/dL (ref 30.0–36.0)
MCV: 90.5 fL (ref 78.0–100.0)
Platelets: 152 10*3/uL (ref 150–400)
RBC: 4.43 MIL/uL (ref 4.22–5.81)
RDW: 14.2 % (ref 11.5–15.5)
WBC: 5 10*3/uL (ref 4.0–10.5)

## 2010-11-23 LAB — GLUCOSE, CAPILLARY: Glucose-Capillary: 208 mg/dL — ABNORMAL HIGH (ref 70–99)

## 2010-11-23 LAB — DIFFERENTIAL
Basophils Absolute: 0 10*3/uL (ref 0.0–0.1)
Basophils Relative: 1 % (ref 0–1)
Eosinophils Absolute: 0.2 10*3/uL (ref 0.0–0.7)
Eosinophils Relative: 4 % (ref 0–5)
Lymphocytes Relative: 30 % (ref 12–46)
Lymphs Abs: 1.5 10*3/uL (ref 0.7–4.0)
Monocytes Absolute: 0.5 10*3/uL (ref 0.1–1.0)
Monocytes Relative: 10 % (ref 3–12)
Neutro Abs: 2.8 10*3/uL (ref 1.7–7.7)
Neutrophils Relative %: 56 % (ref 43–77)

## 2010-11-23 LAB — POCT I-STAT, CHEM 8
BUN: 26 mg/dL — ABNORMAL HIGH (ref 6–23)
Calcium, Ion: 1.15 mmol/L (ref 1.12–1.32)
Chloride: 107 meq/L (ref 96–112)
Creatinine, Ser: 6.5 mg/dL — ABNORMAL HIGH (ref 0.50–1.35)
Glucose, Bld: 150 mg/dL — ABNORMAL HIGH (ref 70–99)
HCT: 43 % (ref 39.0–52.0)
Hemoglobin: 14.6 g/dL (ref 13.0–17.0)
Potassium: 4.2 meq/L (ref 3.5–5.1)
Sodium: 142 meq/L (ref 135–145)
TCO2: 23 mmol/L (ref 0–100)

## 2010-11-23 LAB — MRSA PCR SCREENING: MRSA by PCR: NEGATIVE

## 2010-11-23 MED ORDER — GLUCOSE 40 % PO GEL
1.0000 | Freq: Once | ORAL | Status: AC
  Administered 2010-11-23: 37.5 g via ORAL

## 2010-11-23 NOTE — Progress Notes (Signed)
Presented to clinic for routine scheduled followup visit. However, upon admission to clinic, he was disoriented, lethargic, with, pallor, and diaphoresis. As reported by his case manager, and clinic nurse, he, apparently took an undisclosed amount of insulin this morning. Fingerstick glucose upon arrival to clinic was reported as 37 mg/dL. Attempts by staff to administer oral glucose replacement was unsuccessful to to rapidly progressive mental deterioration and sensorium. He became unresponsive to verbal commands. EMS was contacted for transport to [facility] ED. Peripheral venous access was attempted by clinic staff, but also unsuccessful. Patient was evaluated immediately upon EMS arrival, a venous access established, and 1 amp D50 was given by EMS. He subsequently responded and was transported to the ED. Followup with this clinic will be pursuant to care at the hospital.

## 2010-11-24 ENCOUNTER — Inpatient Hospital Stay (HOSPITAL_COMMUNITY)
Admission: RE | Admit: 2010-11-24 | Discharge: 2010-11-24 | Disposition: A | Discharge status: Home / Self Care | Payer: Self-pay | Source: Ambulatory Visit

## 2010-11-24 LAB — CBC
HCT: 38.1 % — ABNORMAL LOW (ref 39.0–52.0)
MCHC: 34.1 g/dL (ref 30.0–36.0)
Platelets: 145 10*3/uL — ABNORMAL LOW (ref 150–400)
RDW: 14.4 % (ref 11.5–15.5)
WBC: 8.7 10*3/uL (ref 4.0–10.5)

## 2010-11-24 LAB — RENAL FUNCTION PANEL
Albumin: 2.5 g/dL — ABNORMAL LOW (ref 3.5–5.2)
Calcium: 8 mg/dL — ABNORMAL LOW (ref 8.4–10.5)
GFR calc Af Amer: 9 mL/min — ABNORMAL LOW (ref >60)
GFR calc non Af Amer: 8 mL/min — ABNORMAL LOW (ref >60)
Glucose, Bld: 125 mg/dL — ABNORMAL HIGH (ref 70–99)
Phosphorus: 6 mg/dL — ABNORMAL HIGH (ref 2.3–4.6)
Potassium: 5 mEq/L (ref 3.5–5.1)
Sodium: 135 mEq/L (ref 135–145)

## 2010-11-24 LAB — GLUCOSE, CAPILLARY
Glucose-Capillary: 211 mg/dL — ABNORMAL HIGH (ref 70–99)
Glucose-Capillary: 114 mg/dL — ABNORMAL HIGH (ref 70–99)
Glucose-Capillary: 238 mg/dL — ABNORMAL HIGH (ref 70–99)
Glucose-Capillary: 184 mg/dL — ABNORMAL HIGH (ref 70–99)
Glucose-Capillary: 214 mg/dL — ABNORMAL HIGH (ref 70–99)
Glucose-Capillary: 158 mg/dL — ABNORMAL HIGH (ref 70–99)
Glucose-Capillary: 87 mg/dL (ref 70–99)

## 2010-11-25 NOTE — H&P (Signed)
Alvin Daniels, Alvin NO.:  Daniels  MEDICAL RECORD NO.:  [id number]  LOCATION:  [facility]                         FACILITY:  [facility]  PHYSICIAN:  Edythe Lynn, M.D.       DATE OF BIRTH:  [birth year]  DATE OF ADMISSION:  11/23/2010 DATE OF DISCHARGE:                             HISTORY & PHYSICAL   PRIMARY CARE PHYSICIAN:  Unknown.  CHIEF COMPLAINT:  Passed out in the [facility].  HISTORY OF PRESENT ILLNESS:  Alvin Daniels is a 40 year old gentleman with end-stage renal disease, diabetes mellitus type 2, and HIV who went to his regular clinic visit today.  In the clinic, he felt dizzy and almost passed out.  He was found to have a sugar into his 30s.  He received D50 through tubes and he was brought emergently to the hospital.  In emergency room, he was found to have glucose of 150.  He is alert, oriented, and denying any complaints.  Fortunately, he has been running out of his insulin and he has been using an oral hypoglycemic medication, he does not know the name of.  His cousin is going to bring it in.  He denies any fevers, chills, of being sick lately.  PAST MEDICAL HISTORY: 1. End-stage renal disease on dialysis Tuesday, Thursday, and     Saturday. 2. Uncontrolled diabetes. 3. HIV. 4. Anemia of chronic disease.  HOME MEDICATIONS:  None known.  ALLERGIES:  No known drug allergies.  SOCIAL HISTORY:  He lives with the cousin.  Does not smoke.  Does not drink.  Does not use any illicit drugs.  REVIEW OF SYSTEMS:  As per HPI.  All other systems reviewed are negative.  PHYSICAL EXAMINATION:  VITAL SIGNS:  Upon admission temperature is 97.0, blood pressure 110/80, pulse 79, respirations 16, and saturation 100% on room air. GENERAL:  The patient is alert and oriented.  No acute distress. HEENT:  Head:  Normocephalic, atraumatic.  Eyes:  Pupil are equal, round, and reactive to light and accommodation.  Extraocular movements are intact.  Throat  clear. NECK:  Supple.  No JVD. CHEST:  Clear to auscultation without wheezes, rhonchi, or crackles. HEART:  Regular rate and rhythm without murmurs, rubs, or gallops. ABDOMEN:  Soft, nontender.  Bowel sounds are present. LOWER EXTREMITIES:  With some healed scars.  No open wounds.  The right knee has a bandage on, but is not draining anything, he is not tender to palpation.  LABORATORY DATA:  On admission white blood cell count is 5000, hemoglobin 13.5, and platelet count 152.  Sodium is 142, potassium 4.2, chloride 107, BUN 26, creatinine 6.5, and calcium 1.1.  Chest x-ray PA and lateral, normal exam.  Head CT normal exam.  ASSESSMENT AND PLAN:  This is a 40 year old gentleman with end-stage renal disease, human immunodeficiency virus, and diabetes mellitus type II who presents now with a severe life-threatening hypoglycemic event. I am very concerned because he clearly stated to me that he ran out of insulin as he is using a pill for diabetes.  For this reason, I am suspecting probably he is using the sulfonylurea and this hypoglycemia will recur tonight.  I am going to place him on observation with telemetry monitoring, one  set of cardiac enzyme check, and EKG check and we are going to investigate medications once his cousin bring them in. Otherwise, we are going to collect CBGs every 2 hours for now until we sort out the situation.  I have obtained consultation with Nephrology for dialysis tomorrow.  In regards to HIV status, this will be addressed at his future Infectious Disease clinic visits.  He does not seem to have an active bacterial infection currently.  I am going to continue his PCP prophylaxis with Septra.     Edythe Lynn, M.D.     SL/MEDQ  D:  11/23/2010  T:  11/23/2010  Job:  [id number]  cc:   Thayer Headings, MD  Electronically Signed by Edythe Lynn M.D. on 11/25/2010 07:59:12 AM

## 2010-11-27 NOTE — Consult Note (Signed)
Alvin Daniels, Alvin Daniels NO.:  [id number]  MEDICAL RECORD NO.:  [id number]  LOCATION:  6711                         FACILITY:  [facility]  PHYSICIAN:  Sherril Croon, M.D.   DATE OF BIRTH:  September 02, 1970  DATE OF CONSULTATION:  10/05/2010 DATE OF DISCHARGE:                                CONSULTATION   INDICATION FOR CONSULTATION:  Management of end-stage renal disease, volume status, anemia, secondary hyperparathyroidism.  HISTORY OF PRESENT ILLNESS:  The patient is a 40 year old Hispanic male with end-stage renal disease secondary to diabetic nephropathy has been on dialysis since November 2011 at the [facility].  The patient has had essentially no medical followup since discharge despite the dialysis staff having conferences with his brother Alvin Daniels in making numerous appointments for him with clinic.  The patient has had scattered lower extremity sores in the past, but on this past Tuesday was noted to have a much worsening one on his right lateral knee.  Tuesday, it was noted to have marked erythema and tenderness and pain with pending the knee.  He was treated empirically with Tressie Ellis and vancomycin with plans for him to be reevaluated Thursday as his Dialysis Clinic; and if no better or worse, he would be taken to emergency department for evaluation, which was done.  He subsequently was admitted by the Teaching Service.  PAST MEDICAL HISTORY: 1. 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 2. Chronic hypotension. 3. Type 1 diabetes. 4. History of gastroparesis and neuropathy. 5. Iron deficiency anemia. 6. History of methicillin-sensitive staph aureus perirectal abscess,     May 2011. 7. History of pyelonephritis, July 2011. 8. Protein calorie malnutrition.  SOCIAL HISTORY:  The patient lives in a house in [place].  It is unclear who he lives with, but he cannot afford to live alone.  It is likely he lives with his  brother.  He has a very poor social situation with minimal support.  He is unemployed and he has been too ill to work. He has one daughter.  The patient does not smoke, use alcohol, or illicit drugs.  FAMILY HISTORY:  Mother has diabetes.  Father was hit by lightning, and he has a brother locally.  REVIEW OF SYSTEMS:  GENERAL:  Poor appetite and energy level.  Pain of his right knee with arthralgias.  He does not make urine.  He has chronic intermittent skin problems and has diarrhea at times.  CURRENT MEDICATIONS:  70/30 insulin 12 units b.i.d. he also has listed on his medicine, omeprazole, Neurontin, Tums 500.  It is not known if he takes any of these on a regular basis.  Current dialysis orders, 4 hours, Tuesday, Thursday, Saturday, 47.5 kg, Epogen 10,000 units, INFeD 50 mcg IV per week on Thursday, standard heparin 400/A 1.5, 3K 2.25 Ca, variable sodium 148 linear.  ACCESS:  Left upper AV fistula.  The patient has no known drug allergies.  PHYSICAL EXAMINATION:  VITAL SIGNS:  At 6:00 a.m. today, temperature 100.1, pulse 94, respirations 19, blood pressure 91/50, O2 sats 97% on room air, weight 48.8 kg. GENERAL:  Small, chronically ill-appearing Hispanic gentleman. HEENT:  Normocephalic, atraumatic.  Sclera clear.  Mouth, mucosal membranes moist. NECK:  Supple.  No JVD. HEART:  Regular rate and rhythm without murmurs, rubs, or gallops. LUNGS:  Clear to auscultation bilaterally. SKIN:  Lesions scattered on lower extremities. ABDOMEN:  Soft, nontender, and nondistended.  Normoactive bowel sounds. EXTREMITIES:  No cyanosis, no clubbing, or edema.  However, has right swollen knee with lesion on lateral side and marked erythema.  Access, left upper AV fistula patent. NEURO:  Alert and oriented x3.  Cranial nerves II-XII grossly intact.  X- ray of knee yesterday showed severe osteopenia without evidence of fracture.  LABORATORY DATA:  Hemoglobin 9 today, 10.4 yesterday; white  count 14.6 today, 17,000 yesterday; platelets 288,000.  Sodium 133, potassium 4.3, chloride 98, CO2 of 24, BUN 25, creatinine 4.98, platelets 332,000. Calcium 8.6, phosphorus 3.7, albumin 2.0.  ESR 124.  Hemoglobin A1c 11.1.  His last hemoglobin A1c on September 06, 2010, was 10.6, greater than in January, has been in the mid 8 range.  ASSESSMENT AND PLAN: 1. Right knee cellulitis.  No obvious osteo on x-ray.  The patient is     on empiric antibiotics.  His first dose of vancomycin was given on     Oct 02, 2010.  Tressie Ellis also given at that day was changed to Zosyn     upon admission to the hospital.  Blood cultures x2 pending.     Culture and sensitivity was negative for methicillin-resistant     Staphylococcus aureus.  Teaching Service considering further     diagnostic workup per MRI. 2. End-stage renal disease.  We will continue to provide dialysis on     his outpatient schedule on Tuesday, Thursday, Saturdays, monitor     electrolytes and evaluate dry weight. 3. Type 1 diabetes mellitus.  The patient on 70/30 insulin as an     outpatient.  He has been changed to Lantus here.  I strongly     believe he needs to be on the least expensive regimen available as     he has no insurance and limited resources. 4. Secondary hyperparathyroidism, intact PTH is well controlled     without vitamin D.  It is not clear if he is taking Tums.  We will     hold adding these to his current medications and resume if his     phosphorus increases. 5. Anemia.  His hemoglobin has been steadily declining, likely related     to infection as well as increase his ASA dose here.  His monthly     labs were drawn yesterday at his dialysis center, and we will     evaluate iron stores when these are back. 6. Chronic hypotension, stable. 7. Human immunodeficiency virus, current CD-4 count pending.  He is     not on any medications.  Defer to Teaching Service for outpatient     followup. 8. Protein calorie  malnutrition.  Approach source is an additional     protein supplement.  The patient needs nutrition consult.  We will     also add vitamins to his current medications and perhaps we can     give him samples at his dialysis center after discharge.  The patient has been interviewed and examined by Dr. Justin Mend who is fluent in [place] and agrees with this plan of care.     Alvin Daniels, P.A.C.   ______________________________ Sherril Croon, M.D.    MB/MEDQ  D:  10/05/2010  T:  10/06/2010  Job:  [id number]  cc:   Sherril Croon, M.D. Infectious Disease Clinic  [facility]  Electronically Signed by Alvin Daniels P.A. on 10/19/2010 01:46:52 PM Electronically Signed by Edrick Oh M.D. on 11/27/2010 05:09:04 PM

## 2010-11-28 ENCOUNTER — Encounter: Payer: Self-pay | Admitting: Adult Health

## 2010-11-28 ENCOUNTER — Ambulatory Visit (INDEPENDENT_AMBULATORY_CARE_PROVIDER_SITE_OTHER): Payer: Medicaid Other | Admitting: Adult Health

## 2010-11-28 VITALS — BP 120/79 | HR 99 | Temp 98.0°F | Ht 63.0 in | Wt 105.8 lb

## 2010-11-28 DIAGNOSIS — B20 Human immunodeficiency virus [HIV] disease: Secondary | ICD-10-CM

## 2010-11-28 DIAGNOSIS — E119 Type 2 diabetes mellitus without complications: Secondary | ICD-10-CM

## 2010-11-28 LAB — GLUCOSE, CAPILLARY: Glucose-Capillary: 459 mg/dL — ABNORMAL HIGH (ref 70–99)

## 2010-11-28 MED ORDER — RITONAVIR 100 MG PO TABS: 100.0000 mg | ORAL_TABLET | Freq: Every day | ORAL | Status: DC

## 2010-11-28 MED ORDER — EMTRICITABINE 200 MG PO CAPS: ORAL_CAPSULE | ORAL | Status: DC

## 2010-11-28 MED ORDER — TENOFOVIR DISOPROXIL FUMARATE 300 MG PO TABS: ORAL_TABLET | ORAL | Status: DC

## 2010-11-28 MED ORDER — DARUNAVIR ETHANOLATE 400 MG PO TABS: 800.0000 mg | ORAL_TABLET | Freq: Every day | ORAL | Status: DC

## 2010-11-28 MED ORDER — INSULIN ASPART PROT & ASPART (70-30 MIX) 100 UNIT/ML ~~LOC~~ SUSP: SUBCUTANEOUS | Status: DC

## 2010-11-28 MED ORDER — INSULIN SYRINGES (DISPOSABLE) U-100 0.5 ML MISC: Status: DC

## 2010-12-03 ENCOUNTER — Ambulatory Visit (HOSPITAL_COMMUNITY): Admission: RE | Admit: 2010-12-03 | Payer: Self-pay | Source: Ambulatory Visit

## 2010-12-05 NOTE — Discharge Summary (Signed)
NAMELONAS, CALVER NO.:  [id number]  MEDICAL RECORD NO.:  [id number]  LOCATION:  [id number]                         FACILITY:  [facility]  PHYSICIAN:  Edythe Lynn, M.D.       DATE OF BIRTH:  29-Mar-1971  DATE OF ADMISSION:  11/23/2010 DATE OF DISCHARGE:  11/24/2010                              DISCHARGE SUMMARY   PRIMARY CARE PHYSICIAN: [facility]  DISCHARGE DIAGNOSES: 1. Severe life-threatening hypoglycemic event - unclear etiology - the     patient claims he was not taking insulin.  No oral antidiabetic     medication was brought from home to confirm the initial hypothesis     that this hypoglycemia was cause by sulfonylurea. 2. 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 3. End-stage renal disease. 4. Human immunodeficiency virus. 5. Anemia of chronic disease.  DISCHARGE MEDICATIONS: 1. Insulin 70/30, 10 units twice a day. 2. Neurontin 600 mg 3 times a day. 3. Nephro-Vite daily. 4. Cipro 500 g daily. 5. Septra double strength 800/160 half a tablet 3 times a week.  CONDITION ON DISCHARGE:  Mr. Seegars was discharged in good condition.  Temperature 97.6, heart rate 97, respirations 14, and blood pressure 132/95.  Discharge glucose level of 87.  He will follow up with the hemodialysis unit  on Tuesday, Thursday, and Saturday.  He was told to call the Infectious Disease Clinic at [phone] to reschedule his HIV care appointment.  PROCEDURE THIS ADMISSION:  The patient underwent hemodialysis on November 24, 2010.  CONSULTATION:  The patient was seen by the [facility] prior to his hemodialysis.  HISTORY AND PHYSICAL:  Refer to dictated H and P done by Dr. Marye Round on November 23, 2010.  HOSPITAL COURSE:  Mr. Shupe is a 40 year old gentleman with HIV, diabetes, and end-stage renal disease who presented to the HIV Clinic for his followup appointment.  In that clinic, he passed out and was found to have a capillary blood glucose of 30.  Interviewing  the patient, he adamantly claimed that he ran out of insulin and did not take any insulin that morning.  When asked about if he took insulin the night before, his answer was inconclusive.  I honestly could not tell if he did take or did not take insulin the night prior to admission. Furthermore, upon the initial interview, Mr. Foshee was saying that he probably has some medications for diabetes at home in the form of pills.  When his cousin was asked to bring in those medications, he did not bring any tablets.  At this point in time, it is unclear if Mr. Polacek has taken any sulfonylurea prior to this event or not.  In any regards, Ms. Alberda was observed overnight in the hospital without any recurrent life-threatening hypoglycemia.  This makes the likelihood of him having used the sulfonylurea low.  I have discussed with Ms. Perelli the importance of taking only insulin 70/30, 10 units twice a day for now, monitoring his CBGs, and further discussing with the [place], referral to a primary care physician for further diabetes management.  In regards to the patient's HIV status, he will continue the Bactrim for PCP prophylaxis and then he will follow up in the HIV Clinic to  continue HAART.     Edythe Lynn, M.D.     SL/MEDQ  D:  11/25/2010  T:  11/25/2010  Job:  [id number]  cc:   Thayer Headings, MD  Electronically Signed by Edythe Lynn M.D. on 12/05/2010 01:06:49 PM

## 2010-12-12 ENCOUNTER — Ambulatory Visit (INDEPENDENT_AMBULATORY_CARE_PROVIDER_SITE_OTHER): Payer: Medicaid Other | Admitting: Adult Health

## 2010-12-12 ENCOUNTER — Encounter: Payer: Self-pay | Admitting: Adult Health

## 2010-12-12 VITALS — BP 105/71 | HR 92 | Temp 98.3°F | Ht 63.0 in | Wt 103.0 lb

## 2010-12-12 DIAGNOSIS — N186 End stage renal disease: Secondary | ICD-10-CM

## 2010-12-12 DIAGNOSIS — Z21 Asymptomatic human immunodeficiency virus [HIV] infection status: Secondary | ICD-10-CM

## 2010-12-12 DIAGNOSIS — E1165 Type 2 diabetes mellitus with hyperglycemia: Secondary | ICD-10-CM

## 2010-12-12 DIAGNOSIS — B20 Human immunodeficiency virus [HIV] disease: Secondary | ICD-10-CM

## 2010-12-12 DIAGNOSIS — E1129 Type 2 diabetes mellitus with other diabetic kidney complication: Secondary | ICD-10-CM

## 2010-12-12 NOTE — Progress Notes (Signed)
  Subjective:    Patient ID: Alvin Daniels, male    DOB: [birth year], 40 y.o.   MRN: [id number]  HPI Presents to clinic for two-week followup. Relates for the past 1-1/2 weeks. He has been having increased diarrheal episodes that have not stopped. States every food that he eats, ends as diarrhea. He denies any abdominal cramping or vomiting, but does complain of some nausea. He continues to be a regular diet with Glucerna supplements one can 3 times a day. Denies, fevers, chills, or sweats. Claims fingerstick glucoses have been between 150 and 250. Endorses adherence to all his medications with good tolerance and questionable complications (diarrhea.). He is also complaining of neuropathic pain to his lower extremities. He has been prescribed gabapentin for this and is taking 600 mg 3 times a day. Still he is having paresthesia and tingling sensations to both his feet.   Review of Systems  Constitutional: Positive for appetite change and fatigue. Negative for fever, chills, diaphoresis and unexpected weight change.  HENT: Negative.   Eyes: Negative.   Respiratory: Negative.   Cardiovascular: Negative.   Gastrointestinal: Positive for nausea and diarrhea. Negative for vomiting, abdominal pain, constipation, blood in stool, abdominal distention, anal bleeding and rectal pain.  Genitourinary: Negative.   Musculoskeletal: Negative.   Skin: Negative.   Neurological: Positive for numbness. Negative for dizziness, tremors, seizures, syncope, facial asymmetry, speech difficulty, weakness, light-headedness and headaches.  Hematological: Negative for adenopathy. Does not bruise/bleed easily.  Psychiatric/Behavioral: Negative.        Objective:   Physical Exam  Constitutional: He is oriented to person, place, and time. He appears well-developed. No distress.       Underweight-appearing  HENT:  Head: Normocephalic and atraumatic.  Right Ear: External ear normal.  Left Ear: External ear  normal.  Nose: Nose normal.  Mouth/Throat: Oropharynx is clear and moist.  Eyes: Conjunctivae and EOM are normal. Pupils are equal, round, and reactive to light.  Neck: Normal range of motion. Neck supple.  Cardiovascular: Normal rate and regular rhythm.   Pulmonary/Chest: Effort normal and breath sounds normal.  Abdominal: Soft.       Bowel sounds, hyperactive, and rumbling in all 4 quadrants  Musculoskeletal: Normal range of motion.  Neurological: He is alert and oriented to person, place, and time. No cranial nerve deficit. He exhibits normal muscle tone. Coordination normal.  Skin: Skin is warm and dry. He is not diaphoretic.       Wounds to the right leg appeared, to being healed, with some scar tissue noted  Psychiatric: He has a normal mood and affect. His behavior is normal. Judgment and thought content normal.          Assessment & Plan:

## 2010-12-12 NOTE — Patient Instructions (Addendum)
Take GLUCERNA only if blood sugar is low. Fingerstick glucoses should be done at least 4 times a day. Contact clinic if diarrhea persists for 7 more days. Dieta para la Education officer, museum (Diet for Diarrhea, Adult) La diarrea (deposicin frecuente de heces) tiene muchas causas. Este trastorno puede originarse o empeorar por lo que usted come o bebe. La diarrea pudiera aliviarse con un cambio en la dieta. SI NO TOLERA LOS ALIMENTOS SLIDOS:  Beba lquido en abundancia. Evite las bebidas azucaradas, las gaseosas y las bebidas lcteas.   Evite las bebidas que contengan cafena y alcohol.   Puede tratar con bebidas rehidratantes. O puede preparar usted mismo la siguiente receta:   1/2 cucharadita de sal.   3/4 cucharadita de bicarbonato.   1/3 de cucharadita de sal sustituta (cloruro de potasio).   1 Cucharada + 1 cucharadita de azcar.   1 litro de agua  A medida que las heces se vuelvan ms slidas, podr comenzar a ingerir alimentos slidos. Agregue un alimento por vez. Si ciertos alimentos le producen diarrea o se la empeoran, evtelos y pruebe con otros. Se recomienda una dieta baja en fibras y en grasas y sin lactosa. Las comidas frecuentes y en cantidades pequeas son mejor toleradas.   GRUPO DE ALIMENTOS: Siri Cole PERMITIDOS/RECOMENDADOS: Pan blanco, francs, pita, bollos y rosquillas. Muffins, pan cimo.   Galletas de Luis Llorons Torres, saladas o de graham. Pretzel, biscotes, bizcochos. Cereales cocidos en agua. Harina de maz, farina, crema de cereales. Cereales secos: Maz refinado, Briscoe Deutscher. Patatas preparadas de cualquier modo sin piel, macaroni, espaghetti, fideos, arroz refinado. EVITE / USE MODERADAMENTE: Pan, bollos o galletas preparadas con trigo entero, multigranos, salvado, semillas, frutos secos o coco. Tortilla de maz, bases de masa. Vase ms [place]. Chizitos, nachos. Cereales que contengan granos enteros, multigranos, coco, frutos secos o pasas de uva. Harina de avena  cocida o seca. Cereales de grano grueso, granola. Cereales promocionados como con "alto contenido de [place]". Cscara de patatas. Pastas de Suzzanne Cloud, Jerelene Redden. Palomitas de maz.   Panecillos dulces, donas, panqueques, waffles, pan dulce. GRUPO DE ALIMENTOSCharlann Lange PERMITIDOS/RECOMENDADOS: Jugo de tomates o de vegetales.   Vegetales bien cocidos o enlatados sin semillas. [place]: Valeda Malm, pepino sin cscara, [place], espinaca, brotes de soja.   EVITE / USE MODERADAMENTE: [place], cocidos o enlatados: [place], porotos, remolacha, brccoli, repollitos de [place], maz, coles, legumbres, arvejas, batatas. Cocidos: Repollo verde o rojo, espinacas. Evite las porciones grandes de Mining engineer, debido a que los vegetales disminuyen su tamao al cocinarlos y contienen ms fibras por porcin. GRUPO DE ALIMENTOS: FRUTAS PERMITIDOS/RECOMENDADOS: Todas las frutas excepto el jugo de ciruelas. Cocidas o enlatadas: Duraznos, pur de [place], meln, cerezas, cctel de frutas, pomelo, uvas, kiwi, naranjas, melocotn, pera, ciruelas, sandas. [place]: Manzanas sin la piel, banana madura, uvas, meln, cerezas, pomelo, duraznos, naranjas, ciruelas. Limite las porciones a  taza o 1 unidad.   EVITE / USE MODERADAMENTE: [place]: Manzana con piel, damasco, mango, pera, frambuesa, frutillas. Jugo de ciruela, compota o ciruelas secas. Frutas secas, pasas de uva, dtiles. Evite porciones grandes de todas las frutas frescas. GRUPO DE ALIMENTOS: Ubaldo Glassing PERMITIDOS/RECOMENDADOS: Lucretia Field o un bife tierno bien cocido, jamn, ternera, cordero, cerdo o aves. Huevos, queso. pescado, ostra, langostinos, [place], frutos de mar. Hgado y otros rganos.   EVITE / USE MODERADAMENTE: Carnes duras y fibrosas con cartlago. [place] de man, suave o entera. Quesos con semillas, frutos secos u otros alimentos no permitidos. Frutos secos, semillas, legumbres, arvejas secas, lentejas. GRUPO DE  ALIMENTOS:  Steva Colder PERMITIDOS/RECOMENDADOS: Yogur, Steva Colder Lactaid, kefir, yogur bebible, suero de la [place], [place] de soja.   EVITE / USE MODERADAMENTE: Laray Anger Brush, bebidas hechas con [place], batidos. GRUPO DE ALIMENTOS: SOPAS PERMITIDOS/RECOMENDADOS: Consom, caldo o sopas hechas con los alimentos permitidos. Cualquier sopa colada. EVITE / USE MODERADAMENTE: Sopas hechas con vegetales no permitidos, sopas basadas en cremas o leche. GRUPO DE ALIMENTOS: Gavin Potters DULCES PERMITIDOS/RECOMENDADOS: Woody Seller sin azcar, helados de agua sin azcar. EVITE / USE MODERADAMENTE: Tortas y masitas, pasteles hechos con frutas permitidas, budines, natillas, pasteles con crema. Gelatina, fruta, hielo, sorbetes, helados de agua. Helados, batidos sin frutos secos. Caramelos duros, miel, gelatina, melaza, jarabes, azcar, jarabe de chocolate, pastillas de goma, malvaviscos. GRUPO DE ALIMENTOS: Tawnya Crook PERMITIDOS/RECOMENDADOS: Evite todas las grasas y aceites. EVITE / USE MODERADAMENTE: Semillas, frutos secos, aceitunas, paltas. Margarina, Burman Foster, crema, mayonesa, aceites para [name], aderezos para ensaladas hechos con alimentos permitidos. Salsas, tocino sin corteza. GRUPO DE ALIMENTOSArminda Resides PERMITIDOS/RECOMENDADOS: Grayce Sessions, tes descafeinados, soluciones de rehidratacin oral, bebidas sin azcar Automotive engineer).   EVITE / USE MODERADAMENTE: Jugos de fruta, bebidas con cafena (como caf, t y bebidas cola), alcohol, Gatorade o bebidas lima- limn. GRUPO DE ALIMENTOS: Clemencia Course PERMITIDOS/RECOMENDADOS: Ketchup, mostaza, rbano picante, vinagre, cremas, crema de queso, polvo de cacao. Especias con moderacin: [place], laurel, perejil, curry, tomillo, gengibre, mejorana, polvo de cebolla o de ajo, organo, paprika, perejil, pimienta molida, romero, salvia, ajedrea, estragn, tomillo, crcuma. EVITE / USE MODERADAMENTE: Coco, miel  Control del peso: [employer]. Controle su peso  todas las maanas despus de orinar y antes de Merchandiser, retail. Psese siempre con la misma ropa. Registre su peso diariamente. En su prxima visita traiga el registro de sus pesos. Comunquese inmediatamente con su mdico si ha aumentado 10 o ms en un da o 10 en [employer].  SOLICITE ATENCIN MDICA DE INMEDIATO SI:  No puede retener lquidos.   Aparecen vmitos o la diarrea se hace recurrente (vuelve una y Elmon Kirschner).   Aparece dolor en el vientre (abdominal ) que aumenta o se siente en un punto determinado (se localiza).   Usted tienen una temperatura oral de ms de 102 y no puede controlarla con medicamentos.   La diarrea se hace excesiva o contiene sangre o mucosidad.   Presenta debilidad excesiva, mareos, lipotimia o sed extrema.  ASEGRESE QUE:  Comprende estas instrucciones.   Controlar su enfermedad.   Solicitar ayuda inmediatamente si no mejora o si empeora.  Document Released: 04/29/2005 Document Re-Released: 07/24/2009 [facility] Patient Information 2011 [place].Diarrea, instrucciones para   el cuidado en el hogar (Diarrhea, Home Care Instructions for) Las infecciones por grmenes (bacterianas ) o por un virus pueden causar diarrea. El mdico ha determinado que con [place], reposo, e ingestin de lquidos la diarrea debe mejorar. La indicacin general es que siga su dieta normal y beba ms cantidad de lquido que el habitual. Aunque el agua puede evitar la deshidratacin, no contiene Press photographer ni minerales (electrolitos). El caldo, el t liviano sin cafena y las soluciones de rehidratacin oral (SRO) reponen lquidos y Brewing technologist. Debe tomar pequeas cantidades de lquido con frecuencia. Una gran cantidad de agua puede ser difcil de Tree surgeon. Tambin el agua puede hacer dao a los bebs y Robert Lee. Las soluciones de rehidratacin oral estn disponibles en las farmacias. Reponen agua y los electrolitos ms importantes en proporciones adecuadas. Las bebidas deportivas no son tan  efectivas y pueden ser nocivas debido al contenido de azcar que puede [employer]  diarrea.  Las SRO se recomiendan especialmente para los nios con diarrea. En los nios, reponga toda prdida de lquido diarrea con la SRO, segn estas indicaciones:   Si el nio pesa 22 libras o menos (10 kg o menos), ofrzcale 60-120 ml (( -1/2 taza o 2 - 4 onzas) de SRO en cada episodio de deposicin diarreica o vmito.   Si el nio pesa 10 Kg o ms (22 libras o ms), ofrzcale 120-240 ml (1/2 - 1 taza o 4-8 onzas) de SRO en cada episodio de vmito o diarrea.   Mientras se repone de la deshidratacin, el nio debe comer normalmente. Sin embargo, Scientist, research (medical) con alto contenido de Location manager debido a que sta empeora la diarrea. Debe evitar el consumo excesivo de [name], [place], gelatina y [place] bebidas que contengan gran cantidad de Location manager.   Luego de [employer] deshidratacin, se le pueden dar al [employer] lquidos claros que requiera. Los nios deben beber pequeas cantidades de lquido con frecuencia, e ir aumentando la cantidad segn la tolerancia. Deben ingerir gran cantidad de lquido para mantener la orina de tono claro o color amarillo plido.   Los adultos deben seguir dieta normal y beber ms cantidad de lquido que el habitual. Beba pequeas cantidades de lquido con frecuencia y aumente la cantidad segn la tolerancia. Debe ingerir gran cantidad de lquido para mantener la orina de tono claro o color amarillo plido. Los caldos, el t liviano descafeinado, las bebidas lima limn (sin gas) y las SRO reponen lquidos y Brewing technologist.  Evite:  Gaseosas   Jugos    Lquidos muy calientes o fros     Bebidas con cafena     Alimentos muy grasos     Alcohol   Tabaco    Comer demasiado a la Titanic probiticos son cultivos activos de bacterias beneficiosas. Pueden disminuir la cantidad y el nmero de deposiciones diarreicas en el adulto. Se encuentran en los yogures  con cultivos activos y en los suplementos.   Lave bien sus manos para evitar que la bacteria o el virus se diseminen.   No son recomendables los medicamentos antidiarreicos en bebs y nios.   Slo tome medicamentos de Radio broadcast assistant o prescriptos para Glass blower/designer, las molestias o bajar la fiebre segn las indicaciones de su mdico. No le administre aspirina a su nio porque puede causarle el Sndrome de Reye.   Los adultos deshidratados deben Teacher, adult education a su mdico sobre el uso de medicamentos de venta libre o prescriptos.   Si el mdico le ha dado fecha para una visita de control, es importante que concurra. No cumplir con este control puede dar como resultado que el dao, el dolor o la discapacidad sean permanentes. Si tiene problemas para asistir al control, deber [employer] establecimiento para recibir asesoramiento.  SOLICITE ATENCIN MDICA INMEDIATAMENTE SI:  Usted o su nio no pueden retener lquidos o presentan otros sntomas o trastornos que empeoran a Designer, television/film set.   Aparecen vmitos o diarrea, o si ya estn presentes, se vuelven persistentes.   Hay vmitos de sangre o bilis (material verde)   Hay sangre en la materia fecal o sta es de aspecto negro alquitranado.   No hay emisin de [place] durante 6 a 8 horas o se elimina una pequea cantidad de [place].   Aparece dolor abdominal, o ste aumenta o se localiza.   Usted o su nio presentan debilidad [place],  mareos, lipotimia o sed extrema.   Usted o su nio presentan una erupcin, contractura en el cuello, cefalea intensa, irritabilidad o se siente aletargado (somnoliento y difcil de Warehouse manager).  ASEGRESE QUE:  Comprende estas instrucciones.   Controlar su enfermedad.   Solicitar ayuda de inmediato si no mejora o empeora.  Document Released: 04/29/2005 Document Re-Released: 07/24/2009 [facility] Patient Information 2011 [place].

## 2010-12-26 ENCOUNTER — Ambulatory Visit: Payer: Self-pay | Admitting: Adult Health

## 2011-01-10 ENCOUNTER — Emergency Department (HOSPITAL_COMMUNITY): Payer: Medicaid Other

## 2011-01-10 ENCOUNTER — Encounter (HOSPITAL_COMMUNITY): Payer: Self-pay | Admitting: Radiology

## 2011-01-10 ENCOUNTER — Inpatient Hospital Stay (HOSPITAL_COMMUNITY)
Admission: EM | Admit: 2011-01-10 | Discharge: 2011-01-14 | DRG: 312 | Disposition: A | Discharge status: Home / Self Care | Payer: Medicaid Other | Attending: Internal Medicine | Admitting: Internal Medicine

## 2011-01-10 DIAGNOSIS — N186 End stage renal disease: Secondary | ICD-10-CM | POA: Diagnosis present

## 2011-01-10 DIAGNOSIS — Z794 Long term (current) use of insulin: Secondary | ICD-10-CM

## 2011-01-10 DIAGNOSIS — G609 Hereditary and idiopathic neuropathy, unspecified: Secondary | ICD-10-CM | POA: Diagnosis present

## 2011-01-10 DIAGNOSIS — E876 Hypokalemia: Secondary | ICD-10-CM | POA: Diagnosis present

## 2011-01-10 DIAGNOSIS — E46 Unspecified protein-calorie malnutrition: Secondary | ICD-10-CM | POA: Diagnosis present

## 2011-01-10 DIAGNOSIS — I951 Orthostatic hypotension: Principal | ICD-10-CM | POA: Diagnosis present

## 2011-01-10 DIAGNOSIS — Z992 Dependence on renal dialysis: Secondary | ICD-10-CM

## 2011-01-10 DIAGNOSIS — IMO0002 Reserved for concepts with insufficient information to code with codable children: Secondary | ICD-10-CM | POA: Diagnosis present

## 2011-01-10 DIAGNOSIS — E1065 Type 1 diabetes mellitus with hyperglycemia: Secondary | ICD-10-CM | POA: Diagnosis present

## 2011-01-10 DIAGNOSIS — Z833 Family history of diabetes mellitus: Secondary | ICD-10-CM

## 2011-01-10 DIAGNOSIS — I120 Hypertensive chronic kidney disease with stage 5 chronic kidney disease or end stage renal disease: Secondary | ICD-10-CM | POA: Diagnosis present

## 2011-01-10 DIAGNOSIS — Z21 Asymptomatic human immunodeficiency virus [HIV] infection status: Secondary | ICD-10-CM | POA: Diagnosis present

## 2011-01-10 DIAGNOSIS — R197 Diarrhea, unspecified: Secondary | ICD-10-CM | POA: Diagnosis present

## 2011-01-10 DIAGNOSIS — D631 Anemia in chronic kidney disease: Secondary | ICD-10-CM | POA: Diagnosis present

## 2011-01-10 DIAGNOSIS — N2581 Secondary hyperparathyroidism of renal origin: Secondary | ICD-10-CM | POA: Diagnosis present

## 2011-01-10 DIAGNOSIS — Z79899 Other long term (current) drug therapy: Secondary | ICD-10-CM

## 2011-01-10 LAB — DIFFERENTIAL
Eosinophils Absolute: 0.2 10*3/uL (ref 0.0–0.7)
Lymphs Abs: 2.2 10*3/uL (ref 0.7–4.0)
Monocytes Relative: 10 % (ref 3–12)
Neutro Abs: 2.2 10*3/uL (ref 1.7–7.7)
Neutrophils Relative %: 43 % (ref 43–77)

## 2011-01-10 LAB — GLUCOSE, CAPILLARY: Glucose-Capillary: 234 mg/dL — ABNORMAL HIGH (ref 70–99)

## 2011-01-10 LAB — CBC
MCH: 29.1 pg (ref 26.0–34.0)
MCHC: 34.1 g/dL (ref 30.0–36.0)
Platelets: 128 10*3/uL — ABNORMAL LOW (ref 150–400)
RBC: 3.85 MIL/uL — ABNORMAL LOW (ref 4.22–5.81)

## 2011-01-10 LAB — BASIC METABOLIC PANEL
CO2: 29 mEq/L (ref 19–32)
Calcium: 8.6 mg/dL (ref 8.4–10.5)
GFR calc non Af Amer: 14 mL/min — ABNORMAL LOW (ref >60)
Potassium: 3.7 mEq/L (ref 3.5–5.1)
Sodium: 137 mEq/L (ref 135–145)

## 2011-01-10 MED ORDER — IOHEXOL 300 MG/ML  SOLN
100.0000 mL | Freq: Once | INTRAMUSCULAR | Status: AC | PRN
  Administered 2011-01-10: 100 mL via INTRAVENOUS

## 2011-01-11 LAB — CK TOTAL AND CKMB (NOT AT ARMC)
CK, MB: 2.1 ng/mL (ref 0.3–4.0)
Relative Index: INVALID (ref 0.0–2.5)

## 2011-01-11 LAB — GLUCOSE, CAPILLARY
Glucose-Capillary: 211 mg/dL — ABNORMAL HIGH (ref 70–99)
Glucose-Capillary: 290 mg/dL — ABNORMAL HIGH (ref 70–99)
Glucose-Capillary: 252 mg/dL — ABNORMAL HIGH (ref 70–99)

## 2011-01-11 LAB — T-HELPER CELLS (CD4) COUNT (NOT AT ARMC)
CD4 % Helper T Cell: 14 % — ABNORMAL LOW (ref 33–55)
CD4 T Cell Abs: 390 uL — ABNORMAL LOW (ref 400–2700)

## 2011-01-11 LAB — COMPREHENSIVE METABOLIC PANEL
ALT: 37 U/L (ref 0–53)
AST: 36 U/L (ref 0–37)
Albumin: 3.2 g/dL — ABNORMAL LOW (ref 3.5–5.2)
Alkaline Phosphatase: 135 U/L — ABNORMAL HIGH (ref 39–117)
Potassium: 3.3 mEq/L — ABNORMAL LOW (ref 3.5–5.1)
Sodium: 134 mEq/L — ABNORMAL LOW (ref 135–145)
Total Protein: 8.8 g/dL — ABNORMAL HIGH (ref 6.0–8.3)

## 2011-01-11 LAB — CBC
Platelets: 179 10*3/uL (ref 150–400)
RDW: 13.5 % (ref 11.5–15.5)
WBC: 6.3 10*3/uL (ref 4.0–10.5)

## 2011-01-11 LAB — CARDIAC PANEL(CRET KIN+CKTOT+MB+TROPI)
CK, MB: 1.7 ng/mL (ref 0.3–4.0)
Total CK: 60 U/L (ref 7–232)
Troponin I: <0.3 ng/mL (ref <0.30)

## 2011-01-11 LAB — IRON AND TIBC
Iron: 135 ug/dL (ref 42–135)
UIBC: 98 ug/dL — ABNORMAL LOW (ref 125–400)

## 2011-01-11 LAB — FERRITIN: Ferritin: 946 ng/mL — ABNORMAL HIGH (ref 22–322)

## 2011-01-11 LAB — SEDIMENTATION RATE: Sed Rate: 108 mm/hr — ABNORMAL HIGH (ref 0–16)

## 2011-01-12 ENCOUNTER — Inpatient Hospital Stay (HOSPITAL_COMMUNITY): Payer: Medicaid Other

## 2011-01-12 LAB — CBC
HCT: 31.1 % — ABNORMAL LOW (ref 39.0–52.0)
HCT: 30.4 % — ABNORMAL LOW (ref 39.0–52.0)
MCH: 28.9 pg (ref 26.0–34.0)
MCHC: 34.1 g/dL (ref 30.0–36.0)
MCV: 85.4 fL (ref 78.0–100.0)
RDW: 13.5 % (ref 11.5–15.5)
WBC: 6.2 10*3/uL (ref 4.0–10.5)
WBC: 5.9 10*3/uL (ref 4.0–10.5)

## 2011-01-12 LAB — RENAL FUNCTION PANEL
Albumin: 2.9 g/dL — ABNORMAL LOW (ref 3.5–5.2)
BUN: 31 mg/dL — ABNORMAL HIGH (ref 6–23)
BUN: 31 mg/dL — ABNORMAL HIGH (ref 6–23)
Calcium: 8.9 mg/dL (ref 8.4–10.5)
Chloride: 96 mEq/L (ref 96–112)
Creatinine, Ser: 8.42 mg/dL — ABNORMAL HIGH (ref 0.50–1.35)
Creatinine, Ser: 8.44 mg/dL — ABNORMAL HIGH (ref 0.50–1.35)
Glucose, Bld: 210 mg/dL — ABNORMAL HIGH (ref 70–99)
Phosphorus: 5.9 mg/dL — ABNORMAL HIGH (ref 2.3–4.6)
Potassium: 3.9 mEq/L (ref 3.5–5.1)

## 2011-01-12 LAB — DIFFERENTIAL
Basophils Absolute: 0 10*3/uL (ref 0.0–0.1)
Basophils Relative: 0 % (ref 0–1)
Eosinophils Absolute: 0.2 10*3/uL (ref 0.0–0.7)
Eosinophils Relative: 3 % (ref 0–5)
Lymphocytes Relative: 34 % (ref 12–46)

## 2011-01-12 LAB — GLUCOSE, CAPILLARY: Glucose-Capillary: 413 mg/dL — ABNORMAL HIGH (ref 70–99)

## 2011-01-12 LAB — GLUCOSE, RANDOM: Glucose, Bld: 423 mg/dL — ABNORMAL HIGH (ref 70–99)

## 2011-01-13 LAB — GLUCOSE, CAPILLARY
Glucose-Capillary: 161 mg/dL — ABNORMAL HIGH (ref 70–99)
Glucose-Capillary: 276 mg/dL — ABNORMAL HIGH (ref 70–99)

## 2011-01-13 LAB — TSH: TSH: 0.806 u[IU]/mL (ref 0.350–4.500)

## 2011-01-14 LAB — RENAL FUNCTION PANEL
BUN: 39 mg/dL — ABNORMAL HIGH (ref 6–23)
CO2: 23 mEq/L (ref 19–32)
Calcium: 8.9 mg/dL (ref 8.4–10.5)
Creatinine, Ser: 8.01 mg/dL — ABNORMAL HIGH (ref 0.50–1.35)
Glucose, Bld: 191 mg/dL — ABNORMAL HIGH (ref 70–99)

## 2011-01-14 LAB — CBC
HCT: 33.9 % — ABNORMAL LOW (ref 39.0–52.0)
Hemoglobin: 11.2 g/dL — ABNORMAL LOW (ref 13.0–17.0)
MCH: 28.7 pg (ref 26.0–34.0)
MCHC: 33 g/dL (ref 30.0–36.0)

## 2011-01-17 LAB — CULTURE, BLOOD (ROUTINE X 2)
Culture  Setup Time: 201208310509
Culture: NO GROWTH

## 2011-02-21 NOTE — Discharge Summary (Signed)
Alvin Daniels, Alvin Daniels NO.:  [id number]  MEDICAL RECORD NO.:  [id number]  LOCATION:  [id number]                         FACILITY:  [facility]  PHYSICIAN:  Julieta Bellini, MDDATE OF BIRTH:  June 25, 1970  DATE OF ADMISSION:  01/10/2011 DATE OF DISCHARGE:  01/14/2011                              DISCHARGE SUMMARY   DISCHARGING DIAGNOSES: 1. Syncope secondary to orthostasis especially during the days of his     hemodialysis. 2. Human immunodeficiency virus. 3. Anemia of chronic disease. 4. End-stage renal disease on hemodialysis. 5. Secondary hyperparathyroidism. 6. Protein-calorie malnutrition. 7. Neuropathy. 8. Questionable history of chronic diarrhea.  There was no diarrhea     seen throughout this hospitalization. 9. Diabetes mellitus on insulin.  DISCHARGE MEDICATIONS: 1. Nepro by mouth twice a day 237 mL. 2. Neurontin 300 mg 1 tablet by mouth three times a day. 3. NovoLog 12 units subcutaneously twice a day. 4. Renal vitamin over the counter 1 tablet by mouth daily at bedtime. 5. Septra double-strength tablet to take one tablet by mouth on     Mondays, Wednesdays, and Fridays.  DISPOSITION AND FOLLOWUP:  The patient has been discharged in stable condition, currently not complaining of any dizziness, chest pain, shortness of breath, palpitations, or any acute complaints.  He has been instructed to call [facility] in order to arrange a followup appointment and also will follow with his ID Clinic office visit on January 21, 2011.  The patient will continue having his hemodialysis on Tuesday, Thursday, and Saturday, and will continue follow up with Dr. Justin Mend who is his nephrologist.  During his ID followup, it will be important to review the need to have continuation of Septra.  At this point, the patient's CD-4 count are 350 and also will be important to determine when he will return to take his antiretrovirals and which ones.  The patient reports to have  some chronic diarrhea.  It will be important for him to recheck if the diarrhea in fact continues as an outpatient for C. diff and also for Cryptosporidium and if the problems continue then he might require to have a colonoscopy.  Regarding followup with primary care physician, it will be important to continue adjusting the patient's insulin dose and more than anything to try to help him getting his medications, so he can continue being compliant with them.  At the moment, the patient follows with nephrologist for his continued hemodialysis, it will be important to increase his EDW since he had lost a lot of weight from the previous time and it was checked and to make sure that he is now having significant fluid removed during his hemodialysis in order to prevent further orthostatic changes.  PROCEDURE PERFORMED DURING THIS HOSPITALIZATION:  The patient had a chest x-ray on January 10, 2011, that demonstrated no acute cardiopulmonary process and also a CT angio of the chest on January 11, 2011, that demonstrated no acute cardiopulmonary process, specifically no acute pulmonary embolism.  No other procedures were performed during this hospitalization.  HISTORY OF PRESENT ILLNESS:  For full details, please refer to dictation done by Dr. Laurie Panda on January 10, 2011, but briefly this is a 40 year old Spanish male with a past medical history significant for end-stage  renal disease on hemodialysis Tuesday, Thursday, and Saturday and also with HIV.  Last CD-4 count was 320 prior to being admitted.  The patient also has history of peripheral neuropathy and diabetes mellitus chronically on insulin.  Came to the hospital for admission after experiencing syncopal episode after having dialysis.  The patient also reports having some palpitations, shortness of breath, and left-sided chest pain when this episode happens.  The patient was found to be orthostatic and he was referred for admission.  [facility] was called for further evaluation and treatment.  Pertinent laboratory data throughout this hospitalization include a CBC with differential that demonstrated white blood cells 5.2, hemoglobin 11.2, platelets 128.  BMET with a sodium of 137, potassium 3.7, chloride 100, bicarb 29, glucose 162, BUN 14, creatinine 4.61.  MRSA PCR screening was negative.  Magnesium 2.3, phosphorus 3.6.  Cardiac markers negative x3.  Erythrosedimentation rate was 108.  CRP was 0.13.  CD4 count cells demonstrated a level of 390 with a T helper percentage of 14.  Hemoglobin A1c was 11.9.  Blood cultures negative up to the moment of discharge.  The patient had a TSH of 0.806 and a cortisol level in the morning of 10.0.  On the day of discharge, the patient's renal function panel demonstrated a sodium of 131, potassium 5.0, chloride 97, bicarb 23, glucose 191, BUN 39, creatinine 8.01.  Last hemodialysis was done on Saturday as previously scheduled.  CBC at discharge showed a white blood cells of 7.4, hemoglobin 11.2, platelets 162.  HOSPITAL COURSE BY PROBLEM: 1. Syncope secondary to orthostasis.  After discussion with the     patient in detail, he reports that is happening every single time     that he has hemodialysis.  I have discussed with the nephrologist     during this hospitalization and the plan is going to increase his     EDW and try to keep him even during further hemodialysis.  The     patient might also have autonomic orthostatic hypotension most     likely associated with his HIV, but at this point his cortisol is     in the normal range and the patient do not have the monetary power     to buy any further medications, so we are going to take a     conservative approach and if despite increasing his EDW and no     removal of fluids he continues to become orthostatic and     syncopizing then he might need to be started on midodrine.  It is     something that will need to be followed  closely as an outpatient.     At discharge, he was not feeling dizzy even though there was still     some orthostatic changes appreciated on his vital signs. 2. HIV with a CD-4 count of 390.  The patient came using Bactrim 3     times per week.  We are going to continue doing that and he is     going to follow with the ID Clinic on January 21, 2011.  It will     be important to determine when the patient will require to be     started on HAART and if he will be able to stop using Septra now     that his CD-4 count are above 200. 3. Anemia of chronic disease, which had been stable.  At this point,     we  are going to continue following him and he will resume the use     of Epogen during his hemodialysis as needed. 4. End-stage renal disease on hemodialysis Tuesday, Thursday, and     Saturday.  The patient back in to schedule and the plan is for him     to continue having his hemodialysis as an outpatient. 5. Secondary hyperparathyroidism.  Currently, not using any binder.     There is a mild elevation of his phosphorus, but again due to the     difficult compliance with medications because he cannot afford     them.  The nephrologist has decided to continue watching him before     starting him on a permanent binding medication. 6. Protein-calorie malnutrition.  He had been started on Nepro. 7. Neuropathy.  He will continue using his gabapentin. 8. Questionable chronic diarrhea that he had been suffering for the     last couple of months according to him throughout this     hospitalization almost 5 days inside the hospital, he had multiple     bowel movement, all of them well formed and no diarrhea.  In case     that the diarrhea continues, the patient might require to be     examined for Cryptosporidium and to probably have a colonoscopy     done as an outpatient to rule out any other causes for chronic     diarrhea. 9. Diabetes with a hemoglobin A1c of 11.9 demonstrating pretty poor      control.  His insulin has been adjusted to 12 units subcutaneously     twice a day and further adjustment will need to be done as an     outpatient.  Hospital through the indigent fund was able to help     the patient with a bile of insulin.  According to him, he has     supplies at home in order to administer the medication.  PHYSICAL EXAMINATION:  VITAL SIGNS:  At discharge, temperature was 97.3, heart rate 85, respiratory rate 20, blood pressure 96/60, oxygen saturation 98% on room air. GENERAL:  He was in no acute distress. RESPIRATORY:  Clear to auscultation bilaterally. HEART:  S1 and S2.  There were no murmurs. ABDOMEN:  Nontender.  Positive bowel sounds.  No distention.  Soft on palpation. EXTREMITIES:  There were multiple abrasions of his skin, but there was no edema, erythema, or drainage out of these multiple abrasions. NEUROLOGIC:  Nonfocal.     Julieta Bellini, MD     CEM/MEDQ  D:  01/14/2011  T:  01/14/2011  Job:  [id number]  cc:   [facility] Monia Sabal. Jobe Igo, M.D. Michel Bickers, M.D. Thayer Headings, MD Sherril Croon, M.D.  Electronically Signed by Barton Dubois MD on 02/21/2011 07:45:43 AM

## 2011-02-25 NOTE — H&P (Signed)
Alvin Daniels, Alvin Daniels      ACCOUNT NO.:  [id number]  MEDICAL RECORD NO.:  [id number]  LOCATION:  [facility]                         FACILITY:  [facility]  PHYSICIAN:  Jane Birkel I Camyah Pultz, MD      DATE OF BIRTH:  03/15/1971  DATE OF ADMISSION:  01/10/2011 DATE OF DISCHARGE:                             HISTORY & PHYSICAL   PRIMARY CARE PHYSICIAN:  [facility].  CHIEF COMPLAINT:  Syncope and chest pain for 1 day.  HISTORY OF PRESENT ILLNESS:  This is a 40 year old Spanish male and the history mainly obtained through interpreter as the patient does not speak [place].  He has a of history of HIV, CD4 of 320; end-stage renal disease, on hemodialysis Tuesday, Thursday, and Friday; diabetes mellitus, on insulin; and peripheral neuropathy.  The patient admitted he did have his dialysis today.  After that, he was walking, shopping, and suddenly he felt dizzy associated with sudden onset left-sided chest pain and shortness of breath associated with that and then he actually cannot remember what exactly happened to him.  He woke up when the ambulance was called by the people around him.  The patient admitted this as almost last like 10-15 minutes and when he presented to the emergency room, the patient denies any further chest pain, denies any shortness of breath.  He was found to have a systolic blood pressure of 88/55, and when he stands, blood pressure dropped to 70/34 and pulse rate 93, respiratory rate 16, and saturating 100% on room air.  There is no evidence of hypoxia.  He had an EKG which did show normal sinus rhythm with no evidence of NSTEMI or ST depression.  Secondary to persistent symptomatic orthostasis, we were asked to admit.  PAST MEDICAL HISTORY: 1. End-stage renal disease, on hemodialysis. 2. Diabetes mellitus, insulin dependent. 3. HIV. 4. Peripheral neuropathy. 5. Anemia.  ALLERGIES:  No known drug allergies.  MEDICATIONS:  Currently Neurontin, Novolin 70/30.  The  patient is unsure of the retrovirus medication.  PAST SURGICAL HISTORY:  A-V fistula.  SOCIAL HISTORY:  The patient lives in [place] with family.  He does have family including 1 daughter in [place].  He denies tobacco, alcohol, or drug abuse.  FAMILY HISTORY:  Positive for diabetes mellitus in his mother, father, and grandmother.  The patient's father deceased after struck by lightening.  SYSTEMIC REVIEW:  The patient complained of headache but denies any seizure activity.  Complained of numbness on both lower extremities for more than 1 year.  Denies any neck stiffness.  Denied any confusion. Just had 1 episode of chest pain as explained above.  The patient admitted very sharp pain associated with difficulty breathing which per the patient currently completely resolved.  Denies any nausea, vomiting, or abdominal pain.  Denies any palpitation.  Denies any orthopnea or paroxysmal nocturnal dyspnea.  Denies any change in bowel movement. Complained of weakness and numbness of both lower extremities and back pain.  PHYSICAL EXAMINATION:  GENERAL:  The patient lying comfortably on bed, not in respiratory distress or shortness of breath. VITAL SIGNS:  Temperature 98.9, blood pressure 84/50 sitting and dropped to 70/34 when standing, pulse rate 93, respiratory rate 16, saturating 98% on room air. HEENT:  Normocephalic, atraumatic.  Pupils equal, reactive to light and accommodation. NECK:  Supple.  No lymphadenopathy. HEART:  S1 and S2.  No added sound. LUNGS:  Normal vesicular breathing with equal air entry. ABDOMEN:  Soft, nontender.  Bowel sounds are positive. EXTREMITIES:  There is some ulceration on his both lower extremities about 2.1 cm on both lower extremities and some chronic venous changes. Peripheral pulses intact. CNS:  The patient awake, alert and oriented x3.  No focal neurologic deficit, but seems there is pain on his lower back but as mentioned, lower extremity  power intact and reflexes are present.  He has A-V graft on his left arm and with bruit.  LABORATORY DATA:  CBC:  White blood cells 5.2, hemoglobin 11.2, hematocrit 32.8, platelets 128.  Sodium 137, potassium 3.7, chloride 100, glucose 162, BUN 14, creatinine 4.61, calcium 8.6.  EKG, normal sinus rhythm.  ASSESSMENT AND PLAN: 1. This is a 40 year old male with history of end-stage renal disease,     on hemodialysis, just completed his dialysis today and presented     with syncope, sudden onset of chest pain and shortness of breath.     On evaluation in the emergency room, the patient was found to have     orthostasis with hypotension.  The patient will be admitted to     telemetry for further evaluation.  Cause of syncope is likely     secondary to hypotension and dehydration after dialysis.  The     patient will receive 500 mL of IV fluid.  We will get a stat chest     x-ray and we will cycle cardiac enzyme.  His sudden onset of chest     pain and shortness of breath currently completely resolved.  I do     not know what to make out to fit, but I doubt he has any pulmonary     embolism.  I will proceed with CT chest.  I am getting a chest x-     ray for evaluation of the cause of his sudden onset chest pain and     shortness of breath.  As I mentioned when presented to the ED,     there is no evidence of hypoxia or tachycardia.  The only     presentation was hypotension and the main reason for the patient's     syncope is likely hypotension after dialysis from volume loss. 2. Back pain and this is with some peripheral neuropathy.  We will get     ESR and C-reactive protein.  The patient has chronic neuropathy and     he is already on Neurontin.  Currently, the patient has no neuro     deficit. 3. Human immunodeficiency virus.  To resume HIV medication.  We will     ask Pharmacy for medication reconciliation. 4. Diabetes mellitus.  Resume inulin NovoLog at 70/30. 5. Deep venous  thrombosis and gastrointestinal prophylaxis.  This history is taken through interpreter.     Nada Godley Franco Collet, MD     HIE/MEDQ  D:  01/10/2011  T:  01/10/2011  Job:  [id number]  Electronically Signed by Donia Ast MD on 02/25/2011 04:32:24 PM

## 2011-03-29 ENCOUNTER — Other Ambulatory Visit: Payer: Self-pay | Admitting: *Deleted

## 2011-03-29 ENCOUNTER — Ambulatory Visit (INDEPENDENT_AMBULATORY_CARE_PROVIDER_SITE_OTHER): Payer: Self-pay | Admitting: Infectious Diseases

## 2011-03-29 ENCOUNTER — Encounter: Payer: Self-pay | Admitting: Infectious Diseases

## 2011-03-29 DIAGNOSIS — E1165 Type 2 diabetes mellitus with hyperglycemia: Secondary | ICD-10-CM

## 2011-03-29 DIAGNOSIS — G629 Polyneuropathy, unspecified: Secondary | ICD-10-CM

## 2011-03-29 DIAGNOSIS — B20 Human immunodeficiency virus [HIV] disease: Secondary | ICD-10-CM

## 2011-03-29 DIAGNOSIS — E1129 Type 2 diabetes mellitus with other diabetic kidney complication: Secondary | ICD-10-CM

## 2011-03-29 DIAGNOSIS — E119 Type 2 diabetes mellitus without complications: Secondary | ICD-10-CM

## 2011-03-29 DIAGNOSIS — K219 Gastro-esophageal reflux disease without esophagitis: Secondary | ICD-10-CM

## 2011-03-29 DIAGNOSIS — N186 End stage renal disease: Secondary | ICD-10-CM

## 2011-03-29 DIAGNOSIS — R109 Unspecified abdominal pain: Secondary | ICD-10-CM | POA: Insufficient documentation

## 2011-03-29 LAB — COMPREHENSIVE METABOLIC PANEL
Albumin: 3.1 g/dL — ABNORMAL LOW (ref 3.5–5.2)
BUN: 30 mg/dL — ABNORMAL HIGH (ref 6–23)
Calcium: 8.8 mg/dL (ref 8.4–10.5)
Chloride: 95 mEq/L — ABNORMAL LOW (ref 96–112)
Glucose, Bld: 164 mg/dL — ABNORMAL HIGH (ref 70–99)
Potassium: 3.8 mEq/L (ref 3.5–5.3)

## 2011-03-29 MED ORDER — INSULIN ASPART PROT & ASPART (70-30 MIX) 100 UNIT/ML ~~LOC~~ SUSP: SUBCUTANEOUS | Status: DC

## 2011-03-29 MED ORDER — GABAPENTIN 600 MG PO TABS: 600.0000 mg | ORAL_TABLET | Freq: Three times a day (TID) | ORAL | Status: DC

## 2011-03-29 MED ORDER — INSULIN GLARGINE 100 UNIT/ML ~~LOC~~ SOLN: 12.0000 [IU] | Freq: Two times a day (BID) | SUBCUTANEOUS | Status: DC

## 2011-03-29 MED ORDER — PANTOPRAZOLE SODIUM 40 MG PO TBEC: 40.0000 mg | DELAYED_RELEASE_TABLET | Freq: Every day | ORAL | Status: DC

## 2011-03-29 MED ORDER — DARUNAVIR ETHANOLATE 400 MG PO TABS: 800.0000 mg | ORAL_TABLET | Freq: Every day | ORAL | Status: DC

## 2011-03-29 MED ORDER — RITONAVIR 100 MG PO TABS: 100.0000 mg | ORAL_TABLET | Freq: Every day | ORAL | Status: DC

## 2011-03-29 MED ORDER — INSULIN SYRINGES (DISPOSABLE) U-100 0.5 ML MISC: Status: DC

## 2011-03-29 MED ORDER — SULFAMETHOXAZOLE-TRIMETHOPRIM 800-160 MG PO TABS: 1.0000 | ORAL_TABLET | ORAL | Status: DC

## 2011-03-29 MED ORDER — TENOFOVIR DISOPROXIL FUMARATE 300 MG PO TABS: ORAL_TABLET | ORAL | Status: DC

## 2011-03-29 MED ORDER — EMTRICITABINE 200 MG PO CAPS: ORAL_CAPSULE | ORAL | Status: DC

## 2011-03-29 NOTE — Progress Notes (Signed)
Addended by: Dolan Amen D on: 03/29/2011 03:28 PM   Modules accepted: Orders

## 2011-03-29 NOTE — Assessment & Plan Note (Signed)
He is off ART for 3 months. Need to get him back onto meds. He can go to [facility] and get his meds restarted today. He states he has been given flu vax this year. Will see him back in 2 weeks.

## 2011-03-29 NOTE — Progress Notes (Signed)
Addended by: Dolan Amen D on: 03/29/2011 03:34 PM   Modules accepted: Orders

## 2011-03-29 NOTE — Assessment & Plan Note (Addendum)
Will refill his insulin today ( he states he does not have money to pay for). Will try to help him get his meds through med assistance.  to have labs done today.

## 2011-03-29 NOTE — Assessment & Plan Note (Signed)
Will start him on PPI. Hopefully not DKA.

## 2011-03-29 NOTE — Progress Notes (Signed)
  Subjective:    Patient ID: Alvin Daniels, male    DOB: [birth year], 40 y.o.   MRN: [id number]  HPI 40 yo M with DM, ESRD, and HIV+. Was in hospital in June with MRSA bacteremia. Ran out of his meds ("finnished"). Got Rx for 3 months of meds at d/c from hospital but then ran out.  For the last 2 weeks has had abd pain. Unchanged with eating. Has had diarrhea during this period as well. States his FSG have been 100, 105. Has thrown up 2x in the last 2 weeks, relates to eating.  Makes urine, no problems.  Has HD tues, thurs, sat. No problems. Has had occas dizzyness, not everyday. Has lost a lot of weight, 50-60 pounds since 2 years ago. Ran out of insulin yesterday.   Review of Systems See HPI.     Objective:   Physical Exam  Constitutional: He appears well-developed and well-nourished.  Eyes: EOM are normal. Pupils are equal, round, and reactive to light.  Neck: Neck supple.  Cardiovascular: Normal rate, regular rhythm and normal heart sounds.   Pulmonary/Chest: Effort normal and breath sounds normal.  Abdominal: Soft. Bowel sounds are normal. He exhibits no distension.  Lymphadenopathy:    He has no cervical adenopathy.  Neurological:       Decreased light touch on his RLE. Normal on L  Skin: Skin is warm and dry. No erythema.       There is a small amount of scabbing on his great toe nails. There are no diabetic ulcers on his feet.   LUE AVG non-tender, normal bruit.           Assessment & Plan:

## 2011-03-29 NOTE — Assessment & Plan Note (Addendum)
My great appreciation to renal service. He continues to get HD. States he got flu shot there. Will enter as historical.

## 2011-03-29 NOTE — Progress Notes (Signed)
Addended by: Dolan Amen D on: 03/29/2011 03:47 PM   Modules accepted: Orders

## 2011-03-30 LAB — CBC WITH DIFFERENTIAL/PLATELET
Basophils Relative: 0 % (ref 0–1)
Hemoglobin: 10.9 g/dL — ABNORMAL LOW (ref 13.0–17.0)
MCHC: 34.9 g/dL (ref 30.0–36.0)
Monocytes Relative: 10 % (ref 3–12)
Neutro Abs: 3 10*3/uL (ref 1.7–7.7)
Neutrophils Relative %: 56 % (ref 43–77)
Platelets: 198 10*3/uL (ref 150–400)
RBC: 3.55 MIL/uL — ABNORMAL LOW (ref 4.22–5.81)

## 2011-04-01 LAB — T-HELPER CELLS (CD4) COUNT (NOT AT ARMC): Total Lymphocyte: 32 % (ref 12–46)

## 2011-04-02 LAB — HIV-1 RNA ULTRAQUANT REFLEX TO GENTYP+
HIV 1 RNA Quant: 11400 copies/mL — ABNORMAL HIGH (ref <20)
HIV-1 RNA Quant, Log: 4.06 {Log} — ABNORMAL HIGH (ref <1.30)

## 2011-04-15 LAB — HIV-1 GENOTYPR PLUS

## 2011-04-19 ENCOUNTER — Encounter (HOSPITAL_BASED_OUTPATIENT_CLINIC_OR_DEPARTMENT_OTHER): Payer: Self-pay

## 2011-05-03 ENCOUNTER — Encounter (HOSPITAL_BASED_OUTPATIENT_CLINIC_OR_DEPARTMENT_OTHER): Payer: Self-pay | Attending: General Surgery

## 2011-05-03 DIAGNOSIS — Z992 Dependence on renal dialysis: Secondary | ICD-10-CM | POA: Insufficient documentation

## 2011-05-03 DIAGNOSIS — Z79899 Other long term (current) drug therapy: Secondary | ICD-10-CM | POA: Insufficient documentation

## 2011-05-03 DIAGNOSIS — Z21 Asymptomatic human immunodeficiency virus [HIV] infection status: Secondary | ICD-10-CM | POA: Insufficient documentation

## 2011-05-03 DIAGNOSIS — Z794 Long term (current) use of insulin: Secondary | ICD-10-CM | POA: Insufficient documentation

## 2011-05-03 DIAGNOSIS — I10 Essential (primary) hypertension: Secondary | ICD-10-CM | POA: Insufficient documentation

## 2011-05-03 DIAGNOSIS — L97309 Non-pressure chronic ulcer of unspecified ankle with unspecified severity: Secondary | ICD-10-CM | POA: Insufficient documentation

## 2011-05-03 DIAGNOSIS — E1069 Type 1 diabetes mellitus with other specified complication: Secondary | ICD-10-CM | POA: Insufficient documentation

## 2011-05-03 NOTE — Progress Notes (Signed)
[facility]  NAME:  Alvin Daniels, Alvin Daniels NO.:  [id number]  MEDICAL RECORD NO.:  [id number]      DATE OF BIRTH:  01-Apr-1971  PHYSICIAN:  Judene Companion, M.D.           VISIT DATE:                                  OFFICE VISIT   This is a 40 year old Hispanic gentleman who comes to the [facility] with a diabetic foot ulcer on the lateral aspect of his left ankle about a cm in diameter, that it is covered with necrotic skin.  This patient is a 40 year old, thin diabetic who is HIV positive and is on dialysis 3 times a week for total renal shutdown.  At that time, when he was seen here, his creatinine is 9, his BUN is 40.  His A1c is 9.8. His sugar is 250.  The rest of his laboratory looks pretty good.  He is on insulin and an antihypertensive along with his medicine for immunosuppressants of his HIV.  He did not bring his medicine list and he does not speak [place].  So I am not sure what medicine he is on, but he is on dialysis, he is a juvenile diabetic.  He is on insulin.  He does have hypertension and he is HIV positive.  Now, the wound looks like it should heal, it is not infected.  I just debrided off the necrotic skin.  Of note, he has a bounding pulse and a good warm foot and no evidence of any infection.  We put on a collagen dressing and they will change that every other day, and we will see him back here in a week, so his diagnosis is diabetic foot ulcer on the lateral left ankle, HIV positive, a renal dialysis, juvenile diabetes, and hypertension.     Judene Companion, M.D.     PP/MEDQ  D:  05/03/2011  T:  05/03/2011  Job:  [id number]

## 2011-05-17 ENCOUNTER — Encounter (HOSPITAL_BASED_OUTPATIENT_CLINIC_OR_DEPARTMENT_OTHER): Payer: Self-pay

## 2011-05-17 ENCOUNTER — Encounter (HOSPITAL_BASED_OUTPATIENT_CLINIC_OR_DEPARTMENT_OTHER): Payer: Self-pay | Attending: General Surgery

## 2011-05-17 DIAGNOSIS — Z992 Dependence on renal dialysis: Secondary | ICD-10-CM | POA: Insufficient documentation

## 2011-05-17 DIAGNOSIS — Z21 Asymptomatic human immunodeficiency virus [HIV] infection status: Secondary | ICD-10-CM | POA: Insufficient documentation

## 2011-05-17 DIAGNOSIS — L97309 Non-pressure chronic ulcer of unspecified ankle with unspecified severity: Secondary | ICD-10-CM | POA: Insufficient documentation

## 2011-05-17 DIAGNOSIS — E1169 Type 2 diabetes mellitus with other specified complication: Secondary | ICD-10-CM | POA: Insufficient documentation

## 2011-05-31 ENCOUNTER — Encounter (HOSPITAL_BASED_OUTPATIENT_CLINIC_OR_DEPARTMENT_OTHER): Payer: Medicaid Other

## 2011-05-31 ENCOUNTER — Encounter (HOSPITAL_BASED_OUTPATIENT_CLINIC_OR_DEPARTMENT_OTHER): Payer: Self-pay

## 2011-06-14 ENCOUNTER — Encounter (HOSPITAL_BASED_OUTPATIENT_CLINIC_OR_DEPARTMENT_OTHER): Payer: Medicaid Other | Attending: General Surgery

## 2011-06-14 DIAGNOSIS — Z992 Dependence on renal dialysis: Secondary | ICD-10-CM | POA: Insufficient documentation

## 2011-06-14 DIAGNOSIS — I10 Essential (primary) hypertension: Secondary | ICD-10-CM | POA: Insufficient documentation

## 2011-06-14 DIAGNOSIS — L97309 Non-pressure chronic ulcer of unspecified ankle with unspecified severity: Secondary | ICD-10-CM | POA: Insufficient documentation

## 2011-06-14 DIAGNOSIS — E1069 Type 1 diabetes mellitus with other specified complication: Secondary | ICD-10-CM | POA: Insufficient documentation

## 2011-06-14 DIAGNOSIS — Z21 Asymptomatic human immunodeficiency virus [HIV] infection status: Secondary | ICD-10-CM | POA: Insufficient documentation

## 2011-06-25 NOTE — Assessment & Plan Note (Signed)
Needs assistance obtaining his insulin. Recommend followup with his case manager for medicine since. Prescription written

## 2011-06-25 NOTE — Assessment & Plan Note (Addendum)
With his viral load, only being 181. It is most likely he has not been off his meds for over 6 months. Clinically stable on current regimen. Continue present management.  Counseling provided on prevention of transmission of HIV. Condoms offered:  No Medication adherence discussed with patient. Referrals: Case Management and Renal Follow up visit in 1 weeks with labs 2 weeks prior to appointment. Patient verbally acknowledged information provided to them and agreed with plan of care.

## 2011-06-25 NOTE — Assessment & Plan Note (Signed)
On gabapentin therapy. Continue present management

## 2011-06-25 NOTE — Assessment & Plan Note (Signed)
Still improving. Continue present management

## 2011-06-25 NOTE — Assessment & Plan Note (Signed)
Healing well. Continue present management

## 2011-06-25 NOTE — Assessment & Plan Note (Signed)
Not due for labs for another 4 weeks. He claims adherence to his medications. However, given his multiple comorbidities. We will followup with him in 2 weeks.

## 2011-06-25 NOTE — Assessment & Plan Note (Signed)
Continue HD, and followup with nephrology

## 2011-06-25 NOTE — Progress Notes (Signed)
Subjective:    Patient ID: Alvin Daniels is a 41 y.o. male.  Chief Complaint: HIV Follow-up Visit Alvin Daniels is here for follow-up of HIV infection. He is feeling better since his last visit.  He claims continued adherence to therapy with good tolerance and no complications. There are not additional complaints. Still is on hemodialysis. States leg ulcer has healed.  Data Review: Diagnostic studies reviewed.  Review of Systems - General ROS: negative Psychological ROS: negative Ophthalmic ROS: negative ENT ROS: negative Endocrine ROS: negative for - polydipsia/polyuria Respiratory ROS: no cough, shortness of breath, or wheezing Cardiovascular ROS: no chest pain or dyspnea on exertion Gastrointestinal ROS: positive for - abdominal pain Musculoskeletal ROS: negative Neurological ROS: no TIA or stroke symptoms Dermatological ROS: negative  Objective:   General appearance: alert, cooperative and no distress Head: Normocephalic, without obvious abnormality, atraumatic Eyes: conjunctivae/corneas clear. PERRL, EOM's intact. Fundi benign. Ears: normal TM's and external ear canals both ears Throat: lips, mucosa, and tongue normal; teeth and gums normal Resp: clear to auscultation bilaterally Cardio: regular rate and rhythm, S1, S2 normal, no murmur, click, rub or gallop GI: soft, non-tender; bowel sounds normal; no masses,  no organomegaly Extremities: extremities normal, atraumatic, no cyanosis or edema Skin: Skin color, texture, turgor normal. No rashes or lesions Neurologic: Alert and oriented X 3, normal strength and tone. Normal symmetric reflexes. Normal coordination and gait Psych:  No vegetative signs or delusional behaviors noted.    Laboratory: No new labs to evaluate.     Assessment/Plan:   DM On last visit. He had an episode of hypoglycemia, which required emergency transportation to the hospital. He is currently taking his medications as they are  prescribed, but it is uncertain whether or not. He is checking his blood sugars as he is supposed to. Continue followup with his case management, and, perhaps, medication. Adherence. Visiting nurse.  HIV INFECTION Not due for labs for another 4 weeks. He claims adherence to his medications. However, given his multiple comorbidities. We will followup with him in 2 weeks.     Donovan Gatchel A. Magdalen Spatz, [place], [facility] [phone]  06/25/2011, 9:27 PM

## 2011-06-25 NOTE — Assessment & Plan Note (Signed)
Appears to have cleared. Infection. Continue to monitor

## 2011-06-25 NOTE — Assessment & Plan Note (Addendum)
Given his state of multiple comorbidities, treatment was warranted. While he was in the hospital. Currently on Darunavir, Norvir, tenofovir, and Emtriva. He is also on Bactrim for PCP prophylaxis. We will keep on his current regimen and repeat staging labs in 4 weeks, but do to his multiple health issues. We would like to see him again in 1 week.Alvin Daniels

## 2011-06-25 NOTE — Assessment & Plan Note (Signed)
Needs to continue insulin therapy. We'll refer him for medical assistance in obtaining his insulin through his bridge counselor

## 2011-06-25 NOTE — Progress Notes (Signed)
Subjective:    Patient ID: Alvin Daniels is a 41 y.o. male.  Chief Complaint: HIV Follow-up Visit Alvin Daniels is here for follow-up of HIV infection. He is feeling better since his last visit.  He claims continued adherence to therapy with good tolerance and no complications. There are not additional complaints. Still follows up with hemodialysis, and nephrology has been having his medications delivered to him during his dialysis sessions.  Data Review: Diagnostic studies reviewed.  Review of Systems - unchanged  Objective:   General appearance: alert, cooperative and cachectic Resp: clear to auscultation bilaterally Cardio: regular rate and rhythm, S1, S2 normal, no murmur, click, rub or gallop Extremities: Leg ulcer appears to be healing well Skin: Skin color, texture, turgor normal. No rashes or lesions Neurologic: Alert and oriented X 3, normal strength and tone. Normal symmetric reflexes. Normal coordination and gait Psych:  No vegetative signs or delusional behaviors noted.    Laboratory: From 10/12/2010 ,  CD4 count was 150 c/cmm @ 12 %. Viral load 181 copies/ml.     Assessment/Plan:   HIV INFECTION With his viral load, only being 181. It is most likely he has not been off his meds for over 6 months. Clinically stable on current regimen. Continue present management.  Counseling provided on prevention of transmission of HIV. Condoms offered:  No Medication adherence discussed with patient. Referrals: Case Management and Renal Follow up visit in 1 weeks with labs 2 weeks prior to appointment. Patient verbally acknowledged information provided to them and agreed with plan of care.   DM Needs assistance obtaining his insulin. Recommend followup with his case manager for medicine since. Prescription written  RENAL FAILURE, END STAGE Continue HD, and followup with nephrology  Diabetic leg ulcer Still improving. Continue present management      [name] A. Magdalen Spatz, [place], [facility] for Infectious Disease [phone]  06/25/2011, 9:18 PM

## 2011-06-25 NOTE — Assessment & Plan Note (Addendum)
On last visit. He had an episode of hypoglycemia, which required emergency transportation to the hospital. He is currently taking his medications as they are prescribed, but it is uncertain whether or not. He is checking his blood sugars as he is supposed to. Continue followup with his case management, and, perhaps, medication. Adherence. Visiting nurse.

## 2011-06-25 NOTE — Assessment & Plan Note (Signed)
Continue HD followup with nephrology

## 2011-06-27 ENCOUNTER — Encounter: Payer: Self-pay | Admitting: Internal Medicine

## 2011-07-01 ENCOUNTER — Encounter: Payer: Self-pay | Admitting: Internal Medicine

## 2011-07-01 ENCOUNTER — Other Ambulatory Visit: Payer: Self-pay

## 2011-07-01 ENCOUNTER — Ambulatory Visit (INDEPENDENT_AMBULATORY_CARE_PROVIDER_SITE_OTHER): Payer: Self-pay | Admitting: Internal Medicine

## 2011-07-01 VITALS — BP 92/56 | HR 68 | Ht 61.5 in | Wt 114.0 lb

## 2011-07-01 DIAGNOSIS — R1013 Epigastric pain: Secondary | ICD-10-CM

## 2011-07-01 DIAGNOSIS — R197 Diarrhea, unspecified: Secondary | ICD-10-CM

## 2011-07-01 DIAGNOSIS — R112 Nausea with vomiting, unspecified: Secondary | ICD-10-CM

## 2011-07-01 DIAGNOSIS — B20 Human immunodeficiency virus [HIV] disease: Secondary | ICD-10-CM

## 2011-07-01 DIAGNOSIS — Z21 Asymptomatic human immunodeficiency virus [HIV] infection status: Secondary | ICD-10-CM

## 2011-07-01 MED ORDER — ONDANSETRON HCL 4 MG PO TABS: 4.0000 mg | ORAL_TABLET | Freq: Three times a day (TID) | ORAL | Status: DC | PRN

## 2011-07-01 MED ORDER — OMEPRAZOLE 20 MG PO CPDR: 20.0000 mg | DELAYED_RELEASE_CAPSULE | Freq: Every day | ORAL | Status: DC

## 2011-07-01 NOTE — Patient Instructions (Addendum)
You have been scheduled for an endoscopy and Colonoscopy with propofol. Please follow written instructions given to you at your visit today.  We have sent the following medications to your pharmacy for you to pick up at your convenience:moviprep. omperazole Please take as directed  Your physician has requested that you go to the basement for lab work before leaving today:

## 2011-07-02 ENCOUNTER — Encounter: Payer: Self-pay | Admitting: Internal Medicine

## 2011-07-02 NOTE — Progress Notes (Signed)
Subjective:    Patient ID: Alvin Daniels, male    DOB: 21-Jul-1970, 41 y.o.   MRN: [id number]  HPI Alvin Daniels is a 41 year old male with a past medical history of HIV/AIDS now on antiretroviral therapy, diabetes, ESRD, hep C, and anemia who seen in consultation at the request of Dr. Justin Mend for evaluation of diarrhea and abdominal pain. The patient reports ongoing diarrhea for over a year. He reports this depends on, chin when he eats. He estimates 4-5 loose and watery stools per day. He denies nocturnal diarrhea. He does report fecal urgency. He denies bright red blood per rectum or melena. He also denies fever or chills. When asked about medications and how they relate to his diarrhea, he does not feel that his HIV medications have contributed to his loose stools. He also reports lower abdominal cramping which is relieved by defecation. He reports epigastric abdominal pain occurring every one to 2 weeks. He reports this as a spasm or "tightening" type pain. Not sure what makes this worse but doesn't feel it relates to eating. He denies nausea and vomiting. He does report a decreased appetite and approximate 10 pound weight loss in 2 or 3 months. No dysphagia or odynophagia. He was started on pantoprazole some months back, and he reports this medication relieved the epigastric pain, but he has since run out of this medication. No fevers or chills.  Review of Systems Constitutional: Negative for fever, chills, night sweats, activity change, appetite change and unexpected weight change HEENT: Negative for sore throat, mouth sores and trouble swallowing. Eyes: Negative for visual disturbance Respiratory: Negative for cough, chest tightness and shortness of breath Cardiovascular: Negative for chest pain, palpitations and lower extremity swelling Gastrointestinal: See history of present illness Genitourinary: Negative for dysuria and hematuria. Musculoskeletal: Positive for back pain, negative for  arthralgias and myalgias Skin: Negative for rash or color change Neurological: Positive for headaches, negative for weakness, numbness Hematological: Negative for adenopathy, negative for easy bruising/bleeding Psychiatric/behavioral: Negative for depressed mood, negative for anxiety   Patient Active Problem List  Diagnoses  . METHICILLIN SUSCEPTIBLE STAPH AUREUS SEPTICEMIA  . HIV INFECTION  . DIABETES MELLITUS, TYPE II, UNCONTROLLED, W/RENAL COMPS  . RENAL FAILURE, END STAGE  . HEPATITIS C CARRIER  . DM  . ANEMIA  . POLYNEUROPATHY IN DIABETES  . Diabetic leg ulcer  . Cellulitis of right leg  . Abdominal pain   Current Outpatient Prescriptions  Medication Sig Dispense Refill  . Alcohol Swabs (ALCOHOL PREP SWABS) PADS Use as directed  100 each  11  . ciprofloxacin (CIPRO) 500 MG tablet Take 500 mg by mouth daily.       . darunavir (PREZISTA) 400 MG tablet Take 2 tablets (800 mg total) by mouth daily with breakfast.  60 tablet  5  . emtricitabine (EMTRIVA) 200 MG capsule One capsule by mouth every Tuesday, and Saturday following dialysis.  10 capsule  5  . gabapentin (NEURONTIN) 600 MG tablet Take 1 tablet (600 mg total) by mouth 3 (three) times daily.  90 tablet  2  . Glucerna (GLUCERNA) LIQD Take 1 Can by mouth 3 (three) times daily between meals.  48 Can  50  . insulin aspart protamine-insulin aspart (NOVOLOG 70/30) (70-30) 100 UNIT/ML injection Take 15 units subcutaneously every a.m.  And 12 units subcutaneously every p.m.  10 mL  12  . insulin glargine (LANTUS) 100 UNIT/ML injection Inject 12 Units into the skin 2 (two) times daily.  10 mL  6  .  Insulin Syringes, Disposable, U-100 0.5 ML MISC Use as directed twice daily  100 each  11  . pantoprazole (PROTONIX) 40 MG tablet Take 1 tablet (40 mg total) by mouth daily.  30 tablet  6  . ritonavir (NORVIR) 100 MG TABS Take 1 tablet (100 mg total) by mouth daily with breakfast. must take with Prezista.  30 tablet  5  .  sulfamethoxazole-trimethoprim (SEPTRA DS) 800-160 MG per tablet Take 1 tablet by mouth Every Tuesday,Thursday,and Saturday with dialysis. Take 1/2 tablet by mouth every Tuesday, Thursday, and Saturday after dialysis  30 tablet  5  . tenofovir (VIREAD) 300 MG tablet One tablet by mouth weekly every Sunday.  5 tablet  5  . omeprazole (PRILOSEC) 20 MG capsule Take 1 capsule (20 mg total) by mouth daily.  30 capsule  6   No Known Allergies  FH - positive for diabetes and kidney disease  History   Social History  . Marital Status: Single   Social History Main Topics  . Smoking status: Never Smoker   . Smokeless tobacco: Never Used  . Alcohol Use: No  . Drug Use: No      Objective:   Physical Exam BP 92/56  Pulse 68  Ht 5' 1.5" (1.562 m)  Wt 114 lb (51.71 kg)  BMI 21.19 kg/m2 Constitutional: Thin male in no acute distress HEENT: Normocephalic and atraumatic. Oropharynx is clear and moist. No oropharyngeal exudate. Conjunctivae are normal. Pupils are equal round and reactive to light. No scleral icterus. Neck: Neck supple. Trachea midline. Cardiovascular: Normal rate, regular rhythm and intact distal pulses. No M/R/G Pulmonary/chest: Effort normal and breath sounds normal. No wheezing, rales or rhonchi. Abdominal: Soft, nontender, nondistended. Bowel sounds active throughout. There are no masses palpable. No hepatosplenomegaly. Extremities: no clubbing, cyanosis, or edema Neurological: Alert and oriented to person place and time. Skin: Skin is warm and dry. No rashes noted. Psychiatric: Normal mood and affect. Behavior is normal.  CBC    Component Value Date/Time   WBC 5.4 03/29/2011 1510   RBC 3.55* 03/29/2011 1510   HGB 10.9* 03/29/2011 1510   HCT 31.2* 03/29/2011 1510   PLT 198 03/29/2011 1510   MCV 87.9 03/29/2011 1510   MCH 30.7 03/29/2011 1510   MCHC 34.9 03/29/2011 1510   RDW 13.0 03/29/2011 1510   LYMPHSABS 1.7 03/29/2011 1510   MONOABS 0.5 03/29/2011 1510    EOSABS 0.1 03/29/2011 1510   BASOSABS 0.0 03/29/2011 1510    CMP     Component Value Date/Time   NA 132* 03/29/2011 1503   K 3.8 03/29/2011 1503   CL 95* 03/29/2011 1503   CO2 25 03/29/2011 1503   GLUCOSE 164* 03/29/2011 1503   BUN 30* 03/29/2011 1503   CREATININE 6.40* 03/29/2011 1503   CREATININE 8.01* 01/14/2011 0840   CALCIUM 8.8 03/29/2011 1503   CALCIUM 8.5 03/12/2010 1250   PROT 8.6* 03/29/2011 1503   ALBUMIN 3.1* 03/29/2011 1503   AST 24 03/29/2011 1503   ALT 20 03/29/2011 1503   ALKPHOS 123* 03/29/2011 1503   BILITOT 0.4 03/29/2011 1503   GFRNONAA 8* 01/14/2011 0840   GFRAA 9* 01/14/2011 0840       Assessment & Plan:   41 year old male with a past medical history of HIV/AIDS now on antiretroviral therapy, diabetes, ESRD, hep C, and anemia who seen in consultation at the request of Dr. Justin Mend for evaluation of diarrhea and abdominal pain  1. Diarrhea -- the patient's loose stools/diarrhea or chronic, but given  his relative immunocompromise state we need to exclude infectious etiologies.  I will send stool studies including stool culture, ova and parasite, C. difficile, fecal leukocytes, Microsporida, Cyclospora, and  Isophoria.  I also will check a CBC, CMP, and TSH. If this workup is negative, then we will proceed with colonoscopy for further evaluation and random biopsy if macroscopically normal.  2. Epigastric pain -- the patient's epigastric pain responded a few months ago to daily PPI therapy. He is on pantoprazole and has no real way to pay for it. I will give him samples of Nexium 40 mg daily. When these run out of about omeprazole 20 mg daily. We will also perform an upper endoscopy at the same time as his colonoscopy.  Further evaluation after lab data and procedures are performed

## 2011-07-10 ENCOUNTER — Ambulatory Visit (AMBULATORY_SURGERY_CENTER): Payer: Self-pay | Admitting: *Deleted

## 2011-07-10 ENCOUNTER — Telehealth: Payer: Self-pay | Admitting: *Deleted

## 2011-07-10 VITALS — Ht 61.0 in | Wt 112.7 lb

## 2011-07-10 DIAGNOSIS — R112 Nausea with vomiting, unspecified: Secondary | ICD-10-CM

## 2011-07-10 DIAGNOSIS — Z1211 Encounter for screening for malignant neoplasm of colon: Secondary | ICD-10-CM

## 2011-07-10 LAB — COMPREHENSIVE METABOLIC PANEL
ALT: 16 U/L (ref 0–53)
Albumin: 3.1 g/dL — ABNORMAL LOW (ref 3.5–5.2)
CO2: 24 mEq/L (ref 19–32)
Calcium: 9 mg/dL (ref 8.4–10.5)
Chloride: 101 mEq/L (ref 96–112)
GFR: 10.9 mL/min — CL (ref >60.00)
Glucose, Bld: 254 mg/dL — ABNORMAL HIGH (ref 70–99)
Potassium: 4.9 mEq/L (ref 3.5–5.1)
Sodium: 135 mEq/L (ref 135–145)
Total Bilirubin: 0.2 mg/dL — ABNORMAL LOW (ref 0.3–1.2)
Total Protein: 9 g/dL — ABNORMAL HIGH (ref 6.0–8.3)

## 2011-07-10 LAB — CBC WITH DIFFERENTIAL/PLATELET
Basophils Relative: 0.3 % (ref 0.0–3.0)
Eosinophils Relative: 2.9 % (ref 0.0–5.0)
MCV: 97.6 fl (ref 78.0–100.0)
Monocytes Absolute: 0.8 10*3/uL (ref 0.1–1.0)
Monocytes Relative: 12 % (ref 3.0–12.0)
Neutrophils Relative %: 61.8 % (ref 43.0–77.0)
Platelets: 287 10*3/uL (ref 150.0–400.0)
RBC: 3.51 Mil/uL — ABNORMAL LOW (ref 4.22–5.81)
WBC: 6.9 10*3/uL (ref 4.5–10.5)

## 2011-07-10 LAB — TSH: TSH: 0.85 u[IU]/mL (ref 0.35–5.50)

## 2011-07-10 MED ORDER — MOVIPREP 100 G PO SOLR: ORAL | Status: DC

## 2011-07-10 NOTE — Telephone Encounter (Signed)
Received a critical value on pt's creatinine from [facility] in our lab; Value 6.1. Pt is being seen by Dr Justin Mend and per chart, his creat was 6.4 on 03/29/11. Reviewed with Dr Deatra Ina; no orders.

## 2011-07-12 ENCOUNTER — Encounter (HOSPITAL_BASED_OUTPATIENT_CLINIC_OR_DEPARTMENT_OTHER): Payer: Medicaid Other

## 2011-07-15 ENCOUNTER — Other Ambulatory Visit: Payer: Self-pay

## 2011-07-15 DIAGNOSIS — R197 Diarrhea, unspecified: Secondary | ICD-10-CM

## 2011-07-15 DIAGNOSIS — R112 Nausea with vomiting, unspecified: Secondary | ICD-10-CM

## 2011-07-17 ENCOUNTER — Encounter: Payer: Self-pay | Admitting: Internal Medicine

## 2011-07-17 ENCOUNTER — Other Ambulatory Visit: Payer: Self-pay | Admitting: Internal Medicine

## 2011-07-17 ENCOUNTER — Ambulatory Visit (AMBULATORY_SURGERY_CENTER): Payer: Self-pay | Admitting: Internal Medicine

## 2011-07-17 DIAGNOSIS — Z1211 Encounter for screening for malignant neoplasm of colon: Secondary | ICD-10-CM

## 2011-07-17 DIAGNOSIS — R109 Unspecified abdominal pain: Secondary | ICD-10-CM

## 2011-07-17 DIAGNOSIS — R197 Diarrhea, unspecified: Secondary | ICD-10-CM

## 2011-07-17 DIAGNOSIS — K209 Esophagitis, unspecified without bleeding: Secondary | ICD-10-CM

## 2011-07-17 DIAGNOSIS — R112 Nausea with vomiting, unspecified: Secondary | ICD-10-CM

## 2011-07-17 DIAGNOSIS — K2960 Other gastritis without bleeding: Secondary | ICD-10-CM

## 2011-07-17 DIAGNOSIS — R1013 Epigastric pain: Secondary | ICD-10-CM

## 2011-07-17 LAB — GLUCOSE, CAPILLARY
Glucose-Capillary: 268 mg/dL — ABNORMAL HIGH (ref 70–99)
Glucose-Capillary: 267 mg/dL — ABNORMAL HIGH (ref 70–99)

## 2011-07-17 LAB — OVA AND PARASITE SCREEN: OP: NONE SEEN

## 2011-07-17 MED ORDER — ESOMEPRAZOLE MAGNESIUM 40 MG PO CPDR: 40.0000 mg | DELAYED_RELEASE_CAPSULE | Freq: Every day | ORAL | Status: DC

## 2011-07-17 MED ORDER — SODIUM CHLORIDE 0.9 % IV SOLN: 500.0000 mL | INTRAVENOUS | Status: DC

## 2011-07-17 NOTE — Progress Notes (Signed)
Blood pressure low on discharge from procedure room. Fluids running to aide in increasing pressure. Head of bed elevated to aide in increasing blood pressure. Skin warm and dry. Color pink.  Last blood pressure 80/38 and arousable. Giving fluid boluses. Transferring  . To recovery.

## 2011-07-17 NOTE — Op Note (Signed)
[facility]. [place], [place]  [id number]  ENDOSCOPY PROCEDURE REPORT  PATIENT:  Rosio, [name]  MR#:  [id number] BIRTHDATE:  05-24-1970, 40 yrs. old  GENDER:  male ENDOSCOPIST:  Lajuan Lines. Ercel Normoyle, MD Referred by:  Flonnie Hailstone, M.D. PROCEDURE DATE:  07/17/2011 PROCEDURE:  EGD with biopsy for H. pylori [id number] ASA CLASS:  Class III INDICATIONS:  epigastric pain MEDICATIONS:    MAC sedation, administered by CRNA 150 mg IV TOPICAL ANESTHETIC:  none  DESCRIPTION OF PROCEDURE:   After the risks benefits and alternatives of the procedure were thoroughly explained, informed consent was obtained.  The LB GIF-H180 [id number] endoscope was introduced through the mouth and advanced to the second portion of the duodenum, without limitations.  The instrument was slowly withdrawn as the mucosa was fully examined. <<PROCEDUREIMAGES>>  A few scattered white plaques were found in the proximal and mid esophagus. Multiple biopsies were obtained and sent to pathology. Otherwise normal esophagus.  Retained food was present in the fundus which obscured complete visualization during retroflexion. Mild gastritis was found antrum. Biopsies of the antrum and body of the stomach were obtained and sent to pathology.  The duodenal bulb was normal in appearance, as was the postbulbar duodenum. Retroflexed views revealed retained contents obscuring complete visualization.    The scope was then withdrawn from the patient and the procedure completed.  COMPLICATIONS:  None  ENDOSCOPIC IMPRESSION: 1) White plaques in the mid esophagus.  Biopsies performed to rule out candida. 2) Otherwise normal esophagus 3) Food, retained in the fundus, which limited views of the proximal stomach. 4) Mild gastritis in the antrum.  Biopsies obtained. 5) Normal duodenum  RECOMMENDATIONS: 1) Await pathology results 2) Continue PPI, samples given today 3) Follow-up of helicobacter pylori status,  treat if indicated  Alvin Daniels M. Hilarie Fredrickson, MD  CC:  Alcide Evener, MD The Patient  n. eSIGNED:   Lajuan Lines. Mariesha Venturella at 07/17/2011 10:49 AM  [name], [id number]

## 2011-07-17 NOTE — Patient Instructions (Signed)
YOU HAD AN ENDOSCOPIC PROCEDURE TODAY AT THE [facility]: Refer to the procedure report that was given to you for any specific questions about what was found during the examination.  If the procedure report does not answer your questions, please call your gastroenterologist to clarify.  If you requested that your care partner not be given the details of your procedure findings, then the procedure report has been included in a sealed envelope for you to review at your convenience later.  YOU SHOULD EXPECT: Some feelings of bloating in the abdomen. Passage of more gas than usual.  Walking can help get rid of the air that was put into your GI tract during the procedure and reduce the bloating. If you had a lower endoscopy (such as a colonoscopy or flexible sigmoidoscopy) you may notice spotting of blood in your stool or on the toilet paper. If you underwent a bowel prep for your procedure, then you may not have a normal bowel movement for a few days.  DIET: Your first meal following the procedure should be a light meal and then it is ok to progress to your normal diet.  A half-sandwich or bowl of soup is an example of a good first meal.  Heavy or fried foods are harder to digest and may make you feel nauseous or bloated.  Likewise meals heavy in dairy and vegetables can cause extra gas to form and this can also increase the bloating.  Drink plenty of fluids but you should avoid alcoholic beverages for 24 hours.  ACTIVITY: Your care partner should take you home directly after the procedure.  You should plan to take it easy, moving slowly for the rest of the day.  You can resume normal activity the day after the procedure however you should NOT DRIVE or use heavy machinery for 24 hours (because of the sedation medicines used during the test).    SYMPTOMS TO REPORT IMMEDIATELY: A gastroenterologist can be reached at any hour.  During normal business hours, 8:30 AM to 5:00 PM Monday through Friday,  call [phone].  After hours and on weekends, please call the GI answering service at [phone] who will take a message and have the physician on call contact you.   Following lower endoscopy (colonoscopy or flexible sigmoidoscopy):  Excessive amounts of blood in the stool  Significant tenderness or worsening of abdominal pains  Swelling of the abdomen that is new, acute  Fever of 100F or higher  Following upper endoscopy (EGD)  Vomiting of blood or coffee ground material  New chest pain or pain under the shoulder blades  Painful or persistently difficult swallowing  New shortness of breath  Fever of 100F or higher  Black, tarry-looking stools  FOLLOW UP: If any biopsies were taken you will be contacted by phone or by letter within the next 1-3 weeks.  Call your gastroenterologist if you have not heard about the biopsies in 3 weeks.  Our staff will call the home number listed on your records the next business day following your procedure to check on you and address any questions or concerns that you may have at that time regarding the information given to you following your procedure. This is a courtesy call and so if there is no answer at the home number and we have not heard from you through the emergency physician on call, we will assume that you have returned to your regular daily activities without incident.  SIGNATURES/CONFIDENTIALITY: You and/or your care   partner have signed paperwork which will be entered into your electronic medical record.  These signatures attest to the fact that that the information above on your After Visit Summary has been reviewed and is understood.  Full responsibility of the confidentiality of this discharge information lies with you and/or your care-partner.   RESUME MEDICATIONS. INFORMATION GIVEN ON GASTRITIS IN SPANISH WITH D/C INSTRUCTIONS.

## 2011-07-17 NOTE — Op Note (Signed)
[facility]. [place], [place]  [id number]  COLONOSCOPY PROCEDURE REPORT  PATIENT:  Alvin Daniels, Alvin Daniels  MR#:  [id number] BIRTHDATE:  1970-05-30, 40 yrs. old  GENDER:  male ENDOSCOPIST:  Lajuan Lines. Paublo Warshawsky, MD REF. BY:  Flonnie Hailstone, M.D. PROCEDURE DATE:  07/17/2011 PROCEDURE:  Incomplete colonoscopy, Colonoscopy with biopsy ASA CLASS:  Class III INDICATIONS:  unexplained diarrhea MEDICATIONS:   MAC sedation, administered by CRNA, propofol (Diprivan) 50 mg IV  DESCRIPTION OF PROCEDURE:   After the risks benefits and alternatives of the procedure were thoroughly explained, informed consent was obtained.  Digital rectal exam was performed and revealed Stool palpable on digital rectal examination.   The LB [id number] [id number] endoscope was introduced through the anus and advanced to the sigmoid colon, limited by poor preparation.    The quality of the prep was poor..  The instrument was then slowly withdrawn as the colon was fully examined. <<PROCEDUREIMAGES>>  FINDINGS:  The prep was not adequate to allow appropriate inspection of the mucosa, therefore the procedure was terminated. The scope was advanced to the distal sigmoid and the visible mucosa appeared normal.  Again, poor preparation severely limited views and complete visualization was not possible.  Random biopsies with cold forceps were obtained from the sigmoid and rectum given diarrhea history.   Retroflexion was not done due to the presence of stool.  The scope was then withdrawn from the sigmoid  and the procedure completed.  COMPLICATIONS:  None  ENDOSCOPIC IMPRESSION: 1) Poor prep 2) Random biopsies obtained from the sigmoid and rectum given history of diarrhea. 3) Retroflexion not performed.  RECOMMENDATIONS: 1) Await pathology results 2) If diarrhea continues procedure can be repeat with repeat preparation.  Lajuan Lines. Hilarie Fredrickson, MD  CC:  Flonnie Hailstone, MD The  Patient  n. eSIGNEDLajuan Lines. Kowen Kluth at 07/17/2011 10:55 AM  [name], [id number]

## 2011-07-17 NOTE — Progress Notes (Signed)
Patient did not experience any of the following events: a burn prior to discharge; a fall within the facility; wrong site/side/patient/procedure/implant event; or a hospital transfer or hospital admission upon discharge from the facility. [phone]) Patient did not have preoperative order for IV antibiotic SSI prophylaxis. [phone])   Language barrier to get pt. To expell air did not pass any air.abdomen soft pt. Denies pain.

## 2011-07-18 ENCOUNTER — Telehealth: Payer: Self-pay | Admitting: *Deleted

## 2011-07-18 NOTE — Telephone Encounter (Signed)
  Follow up Call-  Call back number 07/17/2011  Post procedure Call Back phone  # [phone]  Permission to leave phone message Yes     [facility]

## 2011-07-19 LAB — STOOL CULTURE

## 2011-07-25 ENCOUNTER — Encounter: Payer: Self-pay | Admitting: Internal Medicine

## 2011-08-02 ENCOUNTER — Other Ambulatory Visit: Payer: Self-pay | Admitting: Infectious Diseases

## 2011-08-02 DIAGNOSIS — G629 Polyneuropathy, unspecified: Secondary | ICD-10-CM

## 2011-08-06 ENCOUNTER — Telehealth: Payer: Self-pay

## 2011-08-06 NOTE — Telephone Encounter (Signed)
Called patient to renew adap/rw as medicaid may no longer be active - left message for patient to call for appt.

## 2011-09-11 ENCOUNTER — Telehealth: Payer: Self-pay | Admitting: *Deleted

## 2011-09-11 ENCOUNTER — Other Ambulatory Visit: Payer: Self-pay | Admitting: *Deleted

## 2011-09-11 DIAGNOSIS — B20 Human immunodeficiency virus [HIV] disease: Secondary | ICD-10-CM

## 2011-09-11 MED ORDER — DARUNAVIR ETHANOLATE 400 MG PO TABS: 800.0000 mg | ORAL_TABLET | Freq: Every day | ORAL | Status: DC

## 2011-09-11 MED ORDER — SULFAMETHOXAZOLE-TRIMETHOPRIM 800-160 MG PO TABS: 1.0000 | ORAL_TABLET | ORAL | Status: DC

## 2011-09-11 MED ORDER — EMTRICITABINE 200 MG PO CAPS: ORAL_CAPSULE | ORAL | Status: DC

## 2011-09-11 MED ORDER — TENOFOVIR DISOPROXIL FUMARATE 300 MG PO TABS: ORAL_TABLET | ORAL | Status: DC

## 2011-09-11 MED ORDER — RITONAVIR 100 MG PO TABS: 100.0000 mg | ORAL_TABLET | Freq: Every day | ORAL | Status: DC

## 2011-09-11 NOTE — Telephone Encounter (Signed)
Refills completed

## 2011-09-11 NOTE — Telephone Encounter (Signed)
Pt has not returned to [facility] for f/u appt.  Message left to call for MD and Lab work appts.

## 2011-09-17 ENCOUNTER — Other Ambulatory Visit: Payer: Self-pay | Admitting: Licensed Clinical Social Worker

## 2011-09-17 DIAGNOSIS — B20 Human immunodeficiency virus [HIV] disease: Secondary | ICD-10-CM

## 2011-09-17 MED ORDER — SULFAMETHOXAZOLE-TRIMETHOPRIM 800-160 MG PO TABS: 1.0000 | ORAL_TABLET | ORAL | Status: DC

## 2011-09-20 ENCOUNTER — Telehealth: Payer: Self-pay | Admitting: *Deleted

## 2011-09-20 NOTE — Telephone Encounter (Signed)
I spoke with his pharmacist about his refill request for Bactrim DS. He said to disregard it as they should have cancelled that one. Of note, he has 6 RX there waiting to be picked up. He no longer has ADAP to pay for them. I called his home number & left a message in [place]. The cell number was out of service. He is overdue for a md appt & needs to reapply for adap so he can get hi smeds

## 2011-09-23 NOTE — Telephone Encounter (Signed)
I called the cell that was not working. I called the other number & got a man who spoke english. He will make sure he gets his meds tomorrow. I told him if there was any problems, call me back. I also asked that he call & make an appt to see the doctor. He will ask him to do this

## 2011-10-08 ENCOUNTER — Emergency Department (HOSPITAL_COMMUNITY)
Admission: EM | Admit: 2011-10-08 | Discharge: 2011-10-09 | Disposition: A | Discharge status: Home / Self Care | Payer: Medicaid Other | Attending: Emergency Medicine | Admitting: Emergency Medicine

## 2011-10-08 ENCOUNTER — Encounter (HOSPITAL_COMMUNITY): Payer: Self-pay | Admitting: *Deleted

## 2011-10-08 ENCOUNTER — Emergency Department (HOSPITAL_COMMUNITY): Payer: Medicaid Other

## 2011-10-08 DIAGNOSIS — T22229A Burn of second degree of unspecified elbow, initial encounter: Secondary | ICD-10-CM | POA: Insufficient documentation

## 2011-10-08 DIAGNOSIS — T2019XA Burn of first degree of multiple sites of head, face, and neck, initial encounter: Secondary | ICD-10-CM | POA: Insufficient documentation

## 2011-10-08 DIAGNOSIS — R55 Syncope and collapse: Secondary | ICD-10-CM

## 2011-10-08 DIAGNOSIS — T300 Burn of unspecified body region, unspecified degree: Secondary | ICD-10-CM

## 2011-10-08 DIAGNOSIS — T2124XA Burn of second degree of lower back, initial encounter: Secondary | ICD-10-CM | POA: Insufficient documentation

## 2011-10-08 DIAGNOSIS — E119 Type 2 diabetes mellitus without complications: Secondary | ICD-10-CM

## 2011-10-08 DIAGNOSIS — Z794 Long term (current) use of insulin: Secondary | ICD-10-CM | POA: Insufficient documentation

## 2011-10-08 DIAGNOSIS — I951 Orthostatic hypotension: Secondary | ICD-10-CM | POA: Insufficient documentation

## 2011-10-08 DIAGNOSIS — Z992 Dependence on renal dialysis: Secondary | ICD-10-CM | POA: Insufficient documentation

## 2011-10-08 DIAGNOSIS — R42 Dizziness and giddiness: Secondary | ICD-10-CM | POA: Insufficient documentation

## 2011-10-08 DIAGNOSIS — E1165 Type 2 diabetes mellitus with hyperglycemia: Secondary | ICD-10-CM | POA: Insufficient documentation

## 2011-10-08 DIAGNOSIS — N186 End stage renal disease: Secondary | ICD-10-CM | POA: Insufficient documentation

## 2011-10-08 DIAGNOSIS — E1129 Type 2 diabetes mellitus with other diabetic kidney complication: Secondary | ICD-10-CM

## 2011-10-08 DIAGNOSIS — I120 Hypertensive chronic kidney disease with stage 5 chronic kidney disease or end stage renal disease: Secondary | ICD-10-CM | POA: Insufficient documentation

## 2011-10-08 DIAGNOSIS — B1920 Unspecified viral hepatitis C without hepatic coma: Secondary | ICD-10-CM | POA: Insufficient documentation

## 2011-10-08 DIAGNOSIS — Z21 Asymptomatic human immunodeficiency virus [HIV] infection status: Secondary | ICD-10-CM | POA: Insufficient documentation

## 2011-10-08 DIAGNOSIS — X118XXA Contact with other hot tap-water, initial encounter: Secondary | ICD-10-CM | POA: Insufficient documentation

## 2011-10-08 LAB — DIFFERENTIAL
Eosinophils Relative: 1 % (ref 0–5)
Lymphocytes Relative: 21 % (ref 12–46)
Lymphs Abs: 1.8 10*3/uL (ref 0.7–4.0)
Monocytes Absolute: 0.5 10*3/uL (ref 0.1–1.0)
Neutro Abs: 5.9 10*3/uL (ref 1.7–7.7)

## 2011-10-08 LAB — CARDIAC PANEL(CRET KIN+CKTOT+MB+TROPI)
CK, MB: 2.6 ng/mL (ref 0.3–4.0)
Troponin I: <0.3 ng/mL (ref <0.30)

## 2011-10-08 LAB — COMPREHENSIVE METABOLIC PANEL
ALT: 81 U/L — ABNORMAL HIGH (ref 0–53)
CO2: 25 mEq/L (ref 19–32)
Calcium: 8.1 mg/dL — ABNORMAL LOW (ref 8.4–10.5)
Chloride: 102 mEq/L (ref 96–112)
Creatinine, Ser: 5.28 mg/dL — ABNORMAL HIGH (ref 0.50–1.35)
GFR calc Af Amer: 14 mL/min — ABNORMAL LOW (ref >90)
GFR calc non Af Amer: 12 mL/min — ABNORMAL LOW (ref >90)
Glucose, Bld: 49 mg/dL — ABNORMAL LOW (ref 70–99)
Total Bilirubin: 0.2 mg/dL — ABNORMAL LOW (ref 0.3–1.2)

## 2011-10-08 LAB — POCT I-STAT, CHEM 8
Calcium, Ion: 1.05 mmol/L — ABNORMAL LOW (ref 1.12–1.32)
Chloride: 104 mEq/L (ref 96–112)
Creatinine, Ser: 5.2 mg/dL — ABNORMAL HIGH (ref 0.50–1.35)
Glucose, Bld: 50 mg/dL — ABNORMAL LOW (ref 70–99)
HCT: 34 % — ABNORMAL LOW (ref 39.0–52.0)
Potassium: 3 mEq/L — ABNORMAL LOW (ref 3.5–5.1)

## 2011-10-08 LAB — CBC
HCT: 32.3 % — ABNORMAL LOW (ref 39.0–52.0)
Hemoglobin: 11.1 g/dL — ABNORMAL LOW (ref 13.0–17.0)
MCV: 92.6 fL (ref 78.0–100.0)
RBC: 3.49 MIL/uL — ABNORMAL LOW (ref 4.22–5.81)
WBC: 8.3 10*3/uL (ref 4.0–10.5)

## 2011-10-08 MED ORDER — ONDANSETRON HCL 4 MG/2ML IJ SOLN
4.0000 mg | Freq: Once | INTRAMUSCULAR | Status: AC
  Administered 2011-10-08: 4 mg via INTRAVENOUS
  Filled 2011-10-08: qty 2

## 2011-10-08 MED ORDER — MORPHINE SULFATE 4 MG/ML IJ SOLN
4.0000 mg | Freq: Once | INTRAMUSCULAR | Status: AC
  Administered 2011-10-08: 4 mg via INTRAVENOUS
  Filled 2011-10-08: qty 1

## 2011-10-08 MED ORDER — INSULIN ASPART PROT & ASPART (70-30 MIX) 100 UNIT/ML ~~LOC~~ SUSP: SUBCUTANEOUS | Status: DC

## 2011-10-08 MED ORDER — SILVER SULFADIAZINE 1 % EX CREA
TOPICAL_CREAM | Freq: Two times a day (BID) | CUTANEOUS | Status: DC
  Administered 2011-10-09: via TOPICAL
  Filled 2011-10-08 (×2): qty 85

## 2011-10-08 MED ORDER — HYDROCODONE-ACETAMINOPHEN 5-500 MG PO TABS: 1.0000 | ORAL_TABLET | Freq: Four times a day (QID) | ORAL | Status: AC | PRN

## 2011-10-08 NOTE — ED Notes (Signed)
The pt returned from xray 

## 2011-10-08 NOTE — ED Notes (Signed)
The pts glucose is low.  Sandwich given

## 2011-10-08 NOTE — ED Notes (Signed)
The pt has diabetes and  He has a graft in his lt forearm
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
## 2011-10-08 NOTE — ED Notes (Signed)
The pt speaks no english  An interpretor says the pt is a dialysis pt and is dialyzed Tuesdays and saturday

## 2011-10-08 NOTE — ED Notes (Signed)
150 mcg fentanyl per EMS prior to arrival

## 2011-10-08 NOTE — ED Notes (Signed)
The pt remains alert.  The pts cousin is supposed to be coming here after he gets off work.  The cousin speaks english
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
## 2012-01-29 ENCOUNTER — Other Ambulatory Visit: Payer: Self-pay | Admitting: Nephrology

## 2012-01-29 DIAGNOSIS — R109 Unspecified abdominal pain: Secondary | ICD-10-CM

## 2012-01-30 ENCOUNTER — Encounter (HOSPITAL_COMMUNITY): Payer: Self-pay | Admitting: Emergency Medicine

## 2012-01-30 ENCOUNTER — Emergency Department (HOSPITAL_COMMUNITY)
Admission: EM | Admit: 2012-01-30 | Discharge: 2012-01-30 | Disposition: A | Discharge status: Home / Self Care | Payer: Medicaid Other | Attending: Emergency Medicine | Admitting: Emergency Medicine

## 2012-01-30 DIAGNOSIS — E119 Type 2 diabetes mellitus without complications: Secondary | ICD-10-CM | POA: Insufficient documentation

## 2012-01-30 DIAGNOSIS — M545 Low back pain, unspecified: Secondary | ICD-10-CM | POA: Insufficient documentation

## 2012-01-30 DIAGNOSIS — Z8614 Personal history of Methicillin resistant Staphylococcus aureus infection: Secondary | ICD-10-CM | POA: Insufficient documentation

## 2012-01-30 DIAGNOSIS — N186 End stage renal disease: Secondary | ICD-10-CM | POA: Insufficient documentation

## 2012-01-30 DIAGNOSIS — Z794 Long term (current) use of insulin: Secondary | ICD-10-CM | POA: Insufficient documentation

## 2012-01-30 DIAGNOSIS — Z8619 Personal history of other infectious and parasitic diseases: Secondary | ICD-10-CM | POA: Insufficient documentation

## 2012-01-30 DIAGNOSIS — Z21 Asymptomatic human immunodeficiency virus [HIV] infection status: Secondary | ICD-10-CM | POA: Insufficient documentation

## 2012-01-30 DIAGNOSIS — M549 Dorsalgia, unspecified: Secondary | ICD-10-CM

## 2012-01-30 MED ORDER — HYDROCODONE-ACETAMINOPHEN 5-500 MG PO TABS: 1.0000 | ORAL_TABLET | Freq: Four times a day (QID) | ORAL | Status: DC | PRN

## 2012-01-30 MED ORDER — HYDROCODONE-ACETAMINOPHEN 5-325 MG PO TABS
1.0000 | ORAL_TABLET | Freq: Once | ORAL | Status: AC
  Administered 2012-01-30: 1 via ORAL
  Filled 2012-01-30: qty 1

## 2012-01-30 NOTE — ED Notes (Signed)
Back pain for many days  And some diarrhea

## 2012-01-30 NOTE — ED Notes (Signed)
Per pickering Md pt moved to pod A room 12 due to history of HIV, diarrhea and other mecical history.

## 2012-01-30 NOTE — ED Notes (Signed)
Patient states he does not make urine

## 2012-01-30 NOTE — ED Provider Notes (Signed)
MSE was initiated and I personally evaluated the patient and placed orders (if any) at  12:11 PM on January 30, 2012.  The patient appears stable so that the remainder of the MSE may be completed by another provider.  Patient with right flank pain and "heat". Patient is a dialysis patient, HIV patient, and diabetic. He'll need more treatment provided in fast track and will be move to  acute care room for further evaluation.  Jasper Riling. Alvino Chapel, MD 01/30/12 1213

## 2012-01-30 NOTE — ED Provider Notes (Signed)
History     CSN: [id number]  Arrival date & time 01/30/12  1145   First MD Initiated Contact with Patient 01/30/12 1158      Chief Complaint  Patient presents with  . Back Pain    (Consider location/radiation/quality/duration/timing/severity/associated sxs/prior treatment) The history is provided by the patient.  pt c/o low back pain for the past couple days. Constant. Dull. Non radiating. Denies injury. Worse w turning torso, bending at waist, palpation of area. No fever or chills. Hx esrd on hd, had hd today. Makes no urine at baseline. No abdominal pain or side pain. Having normal bms. No nvd. No fever or chills. Denies leg or radicular pain. No numbness/weakness.    Past Medical History  Diagnosis Date  . Renal insufficiency   . Diabetes mellitus   . Hepatitis C   . Dialysis patient   . MRSA infection   . HIV (human immunodeficiency virus infection)   . Pancreatitis     History reviewed. No pertinent past surgical history.  Family History  Problem Relation Age of Onset  . Diabetes Mother   . Diabetes Father     History  Substance Use Topics  . Smoking status: Never Smoker   . Smokeless tobacco: Never Used  . Alcohol Use: No      Review of Systems  Constitutional: Negative for fever and chills.  HENT: Negative for neck pain.   Respiratory: Negative for shortness of breath.   Cardiovascular: Negative for chest pain.  Gastrointestinal: Negative for abdominal pain.  Genitourinary: Negative for flank pain.  Musculoskeletal: Positive for back pain.  Skin: Negative for rash.  Neurological: Negative for weakness and numbness.    Allergies  Review of patient's allergies indicates no known allergies.  Home Medications   Current Outpatient Rx  Name Route Sig Dispense Refill  . ALCOHOL PREP SWABS PADS  Use as directed 100 each 11  . EMTRICITABINE 200 MG PO CAPS  One capsule by mouth every Tuesday, and Saturday following dialysis. 10 capsule 5  .  ESOMEPRAZOLE MAGNESIUM 40 MG PO CPDR Oral Take 1 capsule (40 mg total) by mouth daily. 15 capsule 0    Samples given to patient    Lot#    [id number]        ...  . GABAPENTIN 600 MG PO TABS  TAKE 1 TABLET BY MOUTH THREE TIMES DAILY 90 tablet 1  . GLUCERNA PO LIQD Oral Take 1 Can by mouth 3 (three) times daily between meals. 48 Can 50  . INSULIN ASPART PROT & ASPART (70-30) 100 UNIT/ML [place] SUSP  Take 15 units subcutaneously every a.m.  And 12 units subcutaneously every p.m. 10 mL 12  . INSULIN GLARGINE 100 UNIT/ML  SOLN Subcutaneous Inject 12 Units into the skin 2 (two) times daily. 10 mL 6  . INSULIN SYRINGES (DISPOSABLE) U-100 0.5 ML MISC  Use as directed twice daily 100 each 11  . PANTOPRAZOLE SODIUM 40 MG PO TBEC Oral Take 1 tablet (40 mg total) by mouth daily. 30 tablet 6  . RITONAVIR 100 MG PO TABS Oral Take 1 tablet (100 mg total) by mouth daily with breakfast. must take with Prezista. 30 tablet 5  . TENOFOVIR DISOPROXIL FUMARATE 300 MG PO TABS  One tablet by mouth weekly every Sunday. 5 tablet 5    BP 120/105  Pulse 90  Temp 98 F (36.7 C) (Oral)  Resp 20  SpO2 97%  Physical Exam  Nursing note and vitals reviewed. Constitutional: He is  oriented to person, place, and time. He appears well-developed and well-nourished. No distress.  HENT:  Head: Atraumatic.  Eyes: Pupils are equal, round, and reactive to light.  Neck: Neck supple. No tracheal deviation present.  Cardiovascular: Normal rate, regular rhythm, normal heart sounds and intact distal pulses.   Pulmonary/Chest: Effort normal and breath sounds normal. No accessory muscle usage. No respiratory distress.  Abdominal: Soft. He exhibits no distension and no mass. There is no tenderness. There is no rebound and no guarding.  Genitourinary:       No cva tenderness  Musculoskeletal: Normal range of motion. He exhibits no edema and no tenderness.       Left av fistula w palp thrill.  CTLS spine, non tender, aligned, no step off.   Right lumbar muscular tenderness. No rash to area.    Neurological: He is alert and oriented to person, place, and time.       Straight leg raise neg. Motor intact bil.   Skin: Skin is warm and dry.       No shingles or rash in area of pain  Psychiatric: He has a normal mood and affect.    ED Course  Procedures (including critical care time)     MDM  Hydrocodone po.   Pain appears musculoskeletal, lumbar muscular tenderness. No radicular pain. No numbness/weakness. Spine nt. No abdominal pain or tenderness.         Mirna Mires, MD 01/30/12 1414

## 2012-01-30 NOTE — ED Notes (Addendum)
Pt speaks very little english but reports rt side lower back pain for 10 days. Pain radiates to rt knee Pt alert oriented X4
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
## 2012-02-05 ENCOUNTER — Other Ambulatory Visit: Payer: Medicaid Other

## 2012-05-12 DIAGNOSIS — Z91199 Patient's noncompliance with other medical treatment and regimen due to unspecified reason: Secondary | ICD-10-CM | POA: Insufficient documentation

## 2012-06-02 ENCOUNTER — Emergency Department (HOSPITAL_COMMUNITY): Payer: Medicaid Other

## 2012-06-02 ENCOUNTER — Observation Stay (HOSPITAL_COMMUNITY)
Admission: EM | Admit: 2012-06-02 | Discharge: 2012-06-03 | Disposition: A | Discharge status: Home / Self Care | Payer: Medicaid Other | Attending: Internal Medicine | Admitting: Internal Medicine

## 2012-06-02 ENCOUNTER — Encounter (HOSPITAL_COMMUNITY): Payer: Self-pay | Admitting: Emergency Medicine

## 2012-06-02 DIAGNOSIS — Z9181 History of falling: Secondary | ICD-10-CM | POA: Insufficient documentation

## 2012-06-02 DIAGNOSIS — Z21 Asymptomatic human immunodeficiency virus [HIV] infection status: Secondary | ICD-10-CM | POA: Insufficient documentation

## 2012-06-02 DIAGNOSIS — E119 Type 2 diabetes mellitus without complications: Secondary | ICD-10-CM

## 2012-06-02 DIAGNOSIS — R55 Syncope and collapse: Principal | ICD-10-CM | POA: Diagnosis present

## 2012-06-02 DIAGNOSIS — E1122 Type 2 diabetes mellitus with diabetic chronic kidney disease: Secondary | ICD-10-CM | POA: Diagnosis present

## 2012-06-02 DIAGNOSIS — E1129 Type 2 diabetes mellitus with other diabetic kidney complication: Secondary | ICD-10-CM | POA: Insufficient documentation

## 2012-06-02 DIAGNOSIS — I951 Orthostatic hypotension: Secondary | ICD-10-CM

## 2012-06-02 DIAGNOSIS — I959 Hypotension, unspecified: Secondary | ICD-10-CM

## 2012-06-02 DIAGNOSIS — B20 Human immunodeficiency virus [HIV] disease: Secondary | ICD-10-CM | POA: Diagnosis present

## 2012-06-02 DIAGNOSIS — N186 End stage renal disease: Secondary | ICD-10-CM | POA: Insufficient documentation

## 2012-06-02 DIAGNOSIS — Z992 Dependence on renal dialysis: Secondary | ICD-10-CM | POA: Insufficient documentation

## 2012-06-02 HISTORY — DX: Hypotension, unspecified: I95.9

## 2012-06-02 LAB — COMPREHENSIVE METABOLIC PANEL
ALT: 59 U/L — ABNORMAL HIGH (ref 0–53)
BUN: 10 mg/dL (ref 6–23)
CO2: 31 mEq/L (ref 19–32)
Calcium: 8.5 mg/dL (ref 8.4–10.5)
Creatinine, Ser: 4.2 mg/dL — ABNORMAL HIGH (ref 0.50–1.35)
GFR calc Af Amer: 19 mL/min — ABNORMAL LOW (ref >90)
GFR calc non Af Amer: 16 mL/min — ABNORMAL LOW (ref >90)
Glucose, Bld: 83 mg/dL (ref 70–99)
Sodium: 136 mEq/L (ref 135–145)
Total Protein: 8.9 g/dL — ABNORMAL HIGH (ref 6.0–8.3)

## 2012-06-02 LAB — GLUCOSE, CAPILLARY: Glucose-Capillary: 280 mg/dL — ABNORMAL HIGH (ref 70–99)

## 2012-06-02 LAB — CBC
Hemoglobin: 10.8 g/dL — ABNORMAL LOW (ref 13.0–17.0)
MCH: 31.7 pg (ref 26.0–34.0)
MCHC: 34.3 g/dL (ref 30.0–36.0)
MCV: 92.4 fL (ref 78.0–100.0)
RBC: 3.41 MIL/uL — ABNORMAL LOW (ref 4.22–5.81)

## 2012-06-02 LAB — MAGNESIUM: Magnesium: 2.1 mg/dL (ref 1.5–2.5)

## 2012-06-02 MED ORDER — TENOFOVIR DISOPROXIL FUMARATE 300 MG PO TABS: 300.0000 mg | ORAL_TABLET | Freq: Every day | ORAL | Status: DC

## 2012-06-02 MED ORDER — INSULIN ASPART PROT & ASPART (70-30 MIX) 100 UNIT/ML ~~LOC~~ SUSP
15.0000 [IU] | Freq: Two times a day (BID) | SUBCUTANEOUS | Status: DC
  Administered 2012-06-03: 15 [IU] via SUBCUTANEOUS
  Filled 2012-06-02: qty 10

## 2012-06-02 MED ORDER — HYDROMORPHONE HCL PF 1 MG/ML IJ SOLN
1.0000 mg | Freq: Once | INTRAMUSCULAR | Status: AC
  Administered 2012-06-02: 1 mg via INTRAVENOUS
  Filled 2012-06-02: qty 1

## 2012-06-02 MED ORDER — RITONAVIR 100 MG PO TABS: 100.0000 mg | ORAL_TABLET | Freq: Every day | ORAL | Status: DC

## 2012-06-02 MED ORDER — INSULIN ASPART 100 UNIT/ML ~~LOC~~ SOLN
0.0000 [IU] | Freq: Three times a day (TID) | SUBCUTANEOUS | Status: DC
Start: 2012-06-03 — End: 2012-06-03
  Administered 2012-06-03: 2 [IU] via SUBCUTANEOUS

## 2012-06-02 MED ORDER — ACETAMINOPHEN 325 MG PO TABS: 650.0000 mg | ORAL_TABLET | Freq: Four times a day (QID) | ORAL | Status: DC | PRN

## 2012-06-02 MED ORDER — PANTOPRAZOLE SODIUM 40 MG PO TBEC
40.0000 mg | DELAYED_RELEASE_TABLET | Freq: Every day | ORAL | Status: DC
  Administered 2012-06-03: 40 mg via ORAL

## 2012-06-02 MED ORDER — INSULIN ASPART 100 UNIT/ML ~~LOC~~ SOLN
0.0000 [IU] | Freq: Every day | SUBCUTANEOUS | Status: DC
  Administered 2012-06-03: 3 [IU] via SUBCUTANEOUS

## 2012-06-02 MED ORDER — SODIUM CHLORIDE 0.9 % IV BOLUS (SEPSIS): 1000.0000 mL | Freq: Once | INTRAVENOUS | Status: DC

## 2012-06-02 MED ORDER — INSULIN GLARGINE 100 UNIT/ML ~~LOC~~ SOLN: 15.0000 [IU] | Freq: Two times a day (BID) | SUBCUTANEOUS | Status: DC

## 2012-06-02 MED ORDER — ONDANSETRON HCL 4 MG PO TABS: 4.0000 mg | ORAL_TABLET | Freq: Four times a day (QID) | ORAL | Status: DC | PRN

## 2012-06-02 MED ORDER — DOCUSATE SODIUM 100 MG PO CAPS
100.0000 mg | ORAL_CAPSULE | Freq: Two times a day (BID) | ORAL | Status: DC
  Administered 2012-06-03: 100 mg via ORAL
  Filled 2012-06-02 (×2): qty 1

## 2012-06-02 MED ORDER — SODIUM CHLORIDE 0.9 % IV BOLUS (SEPSIS)
1000.0000 mL | Freq: Once | INTRAVENOUS | Status: AC
  Administered 2012-06-02: 1000 mL via INTRAVENOUS

## 2012-06-02 MED ORDER — SODIUM CHLORIDE 0.9 % IJ SOLN
3.0000 mL | Freq: Two times a day (BID) | INTRAMUSCULAR | Status: DC
  Administered 2012-06-03: 3 mL via INTRAVENOUS

## 2012-06-02 MED ORDER — GABAPENTIN 600 MG PO TABS
600.0000 mg | ORAL_TABLET | Freq: Three times a day (TID) | ORAL | Status: DC
  Administered 2012-06-03: 600 mg via ORAL
  Filled 2012-06-02 (×3): qty 1

## 2012-06-02 MED ORDER — EMTRICITABINE 200 MG PO CAPS: 200.0000 mg | ORAL_CAPSULE | Freq: Every day | ORAL | Status: DC

## 2012-06-02 MED ORDER — HEPARIN SODIUM (PORCINE) 5000 UNIT/ML IJ SOLN
5000.0000 [IU] | Freq: Three times a day (TID) | INTRAMUSCULAR | Status: DC
  Administered 2012-06-03: 5000 [IU] via SUBCUTANEOUS
  Filled 2012-06-02 (×4): qty 1

## 2012-06-02 MED ORDER — ONDANSETRON HCL 4 MG/2ML IJ SOLN: 4.0000 mg | Freq: Four times a day (QID) | INTRAMUSCULAR | Status: DC | PRN

## 2012-06-02 MED ORDER — SODIUM CHLORIDE 0.9 % IV SOLN
INTRAVENOUS | Status: DC
  Administered 2012-06-03: 01:00:00 via INTRAVENOUS

## 2012-06-02 NOTE — ED Notes (Signed)
Pt received diet tray up in bed eating.

## 2012-06-02 NOTE — ED Notes (Signed)
Meal tray ordered 

## 2012-06-02 NOTE — ED Notes (Signed)
Renal Diet ordered spoke to [employer]

## 2012-06-02 NOTE — ED Notes (Signed)
nss added to iv 1000cc bolus ordered

## 2012-06-02 NOTE — ED Notes (Signed)
Pt also states is dizzy and drowsy

## 2012-06-02 NOTE — ED Notes (Signed)
The pt is asking for food .  Consulting doctor to see.  Sleeping at intervals
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
## 2012-06-02 NOTE — ED Notes (Signed)
Neck pain since sat is a dialysis pt and was suppose to come on sat but did not have ride did have dialysis today

## 2012-06-02 NOTE — ED Notes (Signed)
Dr Marin Comment here to see

## 2012-06-02 NOTE — H&P (Signed)
[facility] History and Physical  Sly Medor [id number] DOB: 1971/04/01    PCP:   Dr [name].   Chief Complaint: syncope.  HPI: Alvin Daniels is an 42 y.o. male HIV positive, Hep C, DM, ESRD on HD, brought to the ER as he was hypotensive post dialysis today.  His SBP was 56 in the ER.  Over the weekend, he felt lightheaded as well and had 2 episodes of syncope without seizure activities.  Further evaluation in the ER showed normal WBC, Hb 10.6, normal electrolytes, BS 83 and Cr 4.2.  He had a head CT without constrast, and MRI of the cervical area without any acute processes.  He was given 2 liters of IVF with resulting SBP 90, and he felt markedly better.  Because of his hypotension, syncope, and his long stay in the ER (7 hours), hospitalist was asked to admit patient for further observation.  Rewiew of Systems:  Constitutional: Negative for malaise, fever and chills. No significant weight loss or weight gain Eyes: Negative for eye pain, redness and discharge, diplopia, visual changes, or flashes of light. ENMT: Negative for ear pain, hoarseness, nasal congestion, sinus pressure and sore throat. No headaches; tinnitus, drooling, or problem swallowing. Cardiovascular: Negative for chest pain, palpitations, diaphoresis, dyspnea and peripheral edema. ; No orthopnea, PND Respiratory: Negative for cough, hemoptysis, wheezing and stridor. No pleuritic chestpain. Gastrointestinal: Negative for nausea, vomiting, diarrhea, constipation, abdominal pain, melena, blood in stool, hematemesis, jaundice and rectal bleeding.    Genitourinary: Negative for frequency, dysuria, incontinence,flank pain and hematuria; Musculoskeletal: Negative for back pain and neck pain. Negative for swelling and trauma.;  Skin: . Negative for pruritus, rash, abrasions, bruising and skin lesion.; ulcerations Neuro: Negative for headache,  and neck stiffness. Negative for weakness, extremity  weakness, burning feet, involuntary movement, seizure and syncope.  Psych: negative for anxiety, depression, insomnia, tearfulness, panic attacks, hallucinations, paranoia, suicidal or homicidal ideation    Past Medical History  Diagnosis Date  . Renal insufficiency   . Diabetes mellitus   . Hepatitis C   . Dialysis patient   . MRSA infection   . HIV (human immunodeficiency virus infection)   . Pancreatitis     History reviewed. No pertinent past surgical history.  Medications:  HOME MEDS: Prior to Admission medications   Medication Sig Start Date End Date Taking? Authorizing Provider  acetaminophen (TYLENOL) 325 MG tablet Take 650 mg by mouth every 6 (six) hours as needed. For pain   Yes Historical Provider, MD  emtricitabine (EMTRIVA) 200 MG capsule One capsule by mouth every Tuesday, and Saturday following dialysis. 09/11/11  Yes Campbell Riches, MD  esomeprazole (NEXIUM) 40 MG capsule Take 1 capsule (40 mg total) by mouth daily. 07/17/11 07/16/12 Yes Jerene Bears, MD  gabapentin (NEURONTIN) 600 MG tablet Take 600 mg by mouth 3 (three) times daily.   Yes Historical Provider, MD  insulin aspart protamine-insulin aspart (NOVOLOG 70/30) (70-30) 100 UNIT/ML injection Take 15 units subcutaneously every a.m.  And 12 units subcutaneously every p.m. 10/08/11  Yes Blanchie Dessert, MD  insulin glargine (LANTUS) 100 UNIT/ML injection Inject 15-25 Units into the skin 2 (two) times daily. 15 units in morning and 25 units in evening   Yes Historical Provider, MD  ritonavir (NORVIR) 100 MG TABS Take 1 tablet (100 mg total) by mouth daily with breakfast. must take with Prezista. 09/11/11  Yes Campbell Riches, MD  tenofovir Veva Holes) 300 MG tablet One tablet by mouth weekly every Sunday. 09/11/11  Yes Campbell Riches, MD     Allergies:  No Known Allergies  Social History:   reports that he has never smoked. He has never used smokeless tobacco. He reports that he does not drink alcohol or use  illicit drugs.  Family History: Family History  Problem Relation Age of Onset  . Diabetes Mother   . Diabetes Father      Physical Exam: Filed Vitals:   06/02/12 1734 06/02/12 1807 06/02/12 1932 06/02/12 1959  BP: 84/58 90/44 85/63  98/78  Pulse: 84 84 84 93  Temp:   98 F (36.7 C)   TempSrc:   Oral   Resp: 18  20 20   SpO2:  96% 99% 97%   Blood pressure 98/78, pulse 93, temperature 98 F (36.7 C), temperature source Oral, resp. rate 20, SpO2 97.00%.  GEN:  Pleasant  patient lying in the stretcher in no acute distress; cooperative with exam. PSYCH:  alert and oriented x4; does not appear anxious or depressed; affect is appropriate. HEENT: Mucous membranes pink and anicteric; PERRLA; EOM intact; no cervical lymphadenopathy nor thyromegaly or carotid bruit; no JVD; There were no stridor. Neck is very supple. Breasts:: Not examined CHEST WALL: No tenderness CHEST: Normal respiration, clear to auscultation bilaterally.  HEART: Regular rate and rhythm.  There are no murmur, rub, or gallops.   BACK: No kyphosis or scoliosis; no CVA tenderness ABDOMEN: soft and non-tender; no masses, no organomegaly, normal abdominal bowel sounds; no pannus; no intertriginous candida. There is no rebound and no distention. Rectal Exam: Not done EXTREMITIES: No bone or joint deformity; age-appropriate arthropathy of the hands and knees; no edema; no ulcerations.  There is no calf tenderness. AVF on his left side. Genitalia: not examined PULSES: 2+ and symmetric SKIN: Normal hydration no rash or ulceration CNS: Cranial nerves 2-12 grossly intact no focal lateralizing neurologic deficit.  Speech is fluent; uvula elevated with phonation, facial symmetry and tongue midline. DTR are normal bilaterally, cerebella exam is intact, barbinski is negative and strengths are equaled bilaterally.  No sensory loss.   Labs on Admission:  Basic Metabolic Panel:  Lab [id number] 1400  NA 136  K 3.7  CL 94*  CO2 31    GLUCOSE 83  BUN 10  CREATININE 4.20*  CALCIUM 8.5  MG 2.1  PHOS --   Liver Function Tests:  Lab 06/02/12 1400  AST 59*  ALT 59*  ALKPHOS 111  BILITOT 0.3  PROT 8.9*  ALBUMIN 2.9*   No results found for this basename: LIPASE:5,AMYLASE:5 in the last 168 hours No results found for this basename: AMMONIA:5 in the last 168 hours CBC:  Lab 06/02/12 1400  WBC 6.5  NEUTROABS --  HGB 10.8*  HCT 31.5*  MCV 92.4  PLT 197   Cardiac Enzymes: No results found for this basename: CKTOTAL:5,CKMB:5,CKMBINDEX:5,TROPONINI:5 in the last 168 hours  CBG:  Lab 06/02/12 2006  GLUCAP 130*     Radiological Exams on Admission: Ct Head Wo Contrast  06/02/2012  *RADIOLOGY REPORT*  Clinical Data: Pain and dizziness post trauma  CT HEAD WITHOUT CONTRAST  Technique:  Contiguous axial images were obtained from the base of the skull through the vertex without contrast.  Comparison: November 23, 2010  Findings: Ventricles are normal in size and configuration.  There is no mass, hemorrhage, extra-axial fluid collection, or midline shift.  Gray-white compartments are normal.  Bony calvarium appears intact.  The mastoid air cells are clear.  IMPRESSION: Study within normal limits.   Original  Report Authenticated By: Lowella Grip, M.D.    Mr Cervical Spine Wo Contrast  06/02/2012  *RADIOLOGY REPORT*  Clinical Data: Blurred vision with multiple falls over the last 3 days.  Evaluate for cervical diskitis.  MRI CERVICAL SPINE WITHOUT CONTRAST  Technique:  Multiplanar and multiecho pulse sequences of the cervical spine, to include the craniocervical junction and cervicothoracic junction, were obtained according to standard protocol without intravenous contrast.  Comparison: None.  Findings: The cervical alignment is normal.  There is no evidence of diskitis or paraspinal inflammatory change.  There is no endplate edema or destruction.  There is low signal throughout the bones which appears nonfocal.  The  craniocervical junction appears normal.  The cervical cord is normal in signal and caliber.  There are bilateral vertebral artery flow voids.  Axial imaging demonstrates a small central disc protrusion at C5- C6.  There is no cord deformity.  There is no foraminal stenosis. There are no other significant disc space findings.  IMPRESSION:  1.  No evidence of cervical diskitis, osteomyelitis or paraspinal inflammatory change. 2.  Small central disc protrusion at C5-C6.  No cord deformity or foraminal compromise. 3.  Nonspecific low marrow signal throughout the visualized bones. This can be seen as a normal variant, in smokers and subsequent to anemia.   Original Report Authenticated By: Richardean Sale, M.D.     Assessment/Plan Present on Admission:  . Syncope and collapse . HIV INFECTION . DIABETES MELLITUS, TYPE II, UNCONTROLLED, W/RENAL COMPS Hypotension due to volume depletion. ESRD on HD   PLAN: Will admit him for obs due to hypotension, volume depletion, and syncope.  He already had 2 L of IVF, so I am going to give him NS at 50cc/hr.  His medications will be continued.  Although it is low yield, I will cycle his troponin.  Suspect he will do well with his BP, and if so, he can be discharged to home tomorrow.  He is stable, full code, and will be admitted to [facility] service.  It is possible that he is a patient of the HIV clinic (Dr [name] listed as PCP), but since the EDP had made several calls already, I just go ahead and admit him since I already saw him.  Thank you very much.  Other plans as per orders.  Code Status: Trinna Post, MD. [facility] Pager [phone] 7pm to 7am.  06/02/2012, 9:37 PM

## 2012-06-02 NOTE — ED Notes (Signed)
Pt does not speak english ([place])

## 2012-06-02 NOTE — ED Notes (Signed)
The room number had been changed without anyone notifying me 30 minutes ago i called to give report to [id number] and when no one called me back i called them and was told the pt was going to a different room and they had that room number.

## 2012-06-02 NOTE — ED Notes (Signed)
Pt waiting to go to the floor  He has eaten and is resting at present

## 2012-06-03 ENCOUNTER — Encounter (HOSPITAL_COMMUNITY): Payer: Self-pay | Admitting: General Practice

## 2012-06-03 DIAGNOSIS — R55 Syncope and collapse: Secondary | ICD-10-CM

## 2012-06-03 LAB — CBC
HCT: 29.5 % — ABNORMAL LOW (ref 39.0–52.0)
Hemoglobin: 10.1 g/dL — ABNORMAL LOW (ref 13.0–17.0)
MCH: 32.2 pg (ref 26.0–34.0)
MCHC: 34.2 g/dL (ref 30.0–36.0)
MCV: 93.9 fL (ref 78.0–100.0)

## 2012-06-03 LAB — MRSA PCR SCREENING: MRSA by PCR: NEGATIVE

## 2012-06-03 LAB — TROPONIN I
Troponin I: <0.3 ng/mL (ref <0.30)
Troponin I: <0.3 ng/mL (ref <0.30)

## 2012-06-03 LAB — GLUCOSE, CAPILLARY: Glucose-Capillary: 34 mg/dL — CL (ref 70–99)

## 2012-06-03 MED ORDER — MIDODRINE HCL 5 MG PO TABS: 5.0000 mg | ORAL_TABLET | Freq: Three times a day (TID) | ORAL | Status: DC

## 2012-06-03 MED ORDER — INSULIN ASPART PROT & ASPART (70-30 MIX) 100 UNIT/ML ~~LOC~~ SUSP
10.0000 [IU] | Freq: Two times a day (BID) | SUBCUTANEOUS | Status: DC
  Filled 2012-06-03: qty 3

## 2012-06-03 MED ORDER — MIDODRINE HCL 5 MG PO TABS
5.0000 mg | ORAL_TABLET | Freq: Three times a day (TID) | ORAL | Status: DC
  Administered 2012-06-03 (×2): 5 mg via ORAL
  Filled 2012-06-03 (×4): qty 1

## 2012-06-03 NOTE — Progress Notes (Signed)
Interpreter Lesle Chris for [employer]

## 2012-06-03 NOTE — Progress Notes (Signed)
Pt discharged to home after visit summary reviewed through an interpreter and pt capable of re verbalizing medications and follow up appointments. Pt remains stable. No signs and symptoms of distress. Educated to return to ER in the event of SOB, dizziness, chest pain, or fainting. Jobe Igo, RN

## 2012-06-03 NOTE — Progress Notes (Signed)
Physical Therapy Evaluation Patient Details Name: Alvin Daniels MRN: [id number] DOB: 1971/01/31 Today's Date: 06/03/2012 Time: [id number] PT Time Calculation (min): 21 min  PT Assessment / Plan / Recommendation Clinical Impression  42 yo male admitted with hypotensive episode post HD; Stable now; Presents to PT at independent functional level; No further PT needs noted; Will sign off    PT Assessment  Patent does not need any further PT services    Follow Up Recommendations  No PT follow up    Does the patient have the potential to tolerate intense rehabilitation      Barriers to Discharge        Equipment Recommendations  None recommended by PT    Recommendations for Other Services     Frequency      Precautions / Restrictions Precautions Precautions: None   Pertinent Vitals/Pain Orthostatic BPs  Supine 122/73  Sitting 124/82  Sitting after 1 min 110/72     Standing after amb in hallway 113/70         Mobility  Bed Mobility Bed Mobility: Supine to Sit Supine to Sit: 7: Independent Transfers Transfers: Sit to Stand;Stand to Sit Sit to Stand: 7: Independent Stand to Sit: 7: Independent Ambulation/Gait Ambulation/Gait Assistance: 7: Independent Ambulation Distance (Feet): 300 Feet Assistive device: None Ambulation/Gait Assistance Details: WNL Gait Pattern: Within Functional Limits Gait velocity: Normal    Shoulder Instructions     Exercises     PT Diagnosis:    PT Problem List:   PT Treatment Interventions:     PT Goals    Visit Information  Last PT Received On: 06/03/12 Assistance Needed: +1    Subjective Data  Subjective: Agreeable to walk; States he is feeling well and would like to go home Patient Stated Goal: Home   Prior [place] Lives With: Other (Comment) (roommate) Available Help at Discharge: Friend(s);Available PRN/intermittently Type of Home: Apartment Home Access: Level entry Home Layout: One  level Home Adaptive Equipment: None Prior Function Level of Independence: Independent Able to Take Stairs?: Yes Driving: No Communication Communication: No difficulties;Prefers language other than English    Cognition  Overall Cognitive Status: Appears within functional limits for tasks assessed/performed Arousal/Alertness: Awake/alert Orientation Level: Appears intact for tasks assessed Behavior During Session: [facility] for tasks performed    Extremity/Trunk Assessment Right Upper Extremity Assessment RUE ROM/Strength/Tone: Within functional levels Left Upper Extremity Assessment LUE ROM/Strength/Tone: Within functional levels (Noted abrasions Right elbow) Right Lower Extremity Assessment RLE ROM/Strength/Tone: Within functional levels Left Lower Extremity Assessment LLE ROM/Strength/Tone: Within functional levels Trunk Assessment Trunk Assessment: Normal   Balance    End of Session PT - End of Session Activity Tolerance: Patient tolerated treatment well Patient left: Other (comment) (independent in room) Nurse Communication: Mobility status  GP Functional Assessment Tool Used: Clinical Judgement Functional Limitation: Mobility: Walking and moving around Mobility: Walking and Moving Around Current Status [phone]): 0 percent impaired, limited or restricted Mobility: Walking and Moving Around Goal Status [id number]): 0 percent impaired, limited or restricted Mobility: Walking and Moving Around Discharge Status [phone]): 0 percent impaired, limited or restricted   [facility] [place], [place]  06/03/2012, 2:41 PM

## 2012-06-03 NOTE — Progress Notes (Signed)
  Echocardiogram 2D Echocardiogram has been performed.  Ardelle Balls A 06/03/2012, 12:30 PM

## 2012-06-03 NOTE — Progress Notes (Signed)
Pt transferred from the ED around 2230, admitted to Rm [id number]. Pt comes from home with friends/family. She is alert and oriented, however primary language is Spanish (speaks broken [place]). Ambulates independantly. Pt has several healed lesions all over torso and extremities. Placed foam dressing to open/draiing lesions on Left leg (4x1x0.1), also placed foam dressing to Right leg lesion(6x2x0.1). Pt also has open area to Left elbow(2x2x0), but pt decline dressing. He refused skin assessment of sacrum, old healed lesions to lower back noted. States he does not have any sores on his bottom. Placed on telemetry, running NSR. Oriented to room, call bell within reach. Resting comfortably at this time, will continue to monitor

## 2012-06-03 NOTE — Discharge Summary (Signed)
Physician Discharge Summary  Alvin Daniels MRN: [id number] DOB/AGE: [birth year] 42 y.o.  PCP: No primary provider on file.   Admit date: 06/02/2012 Discharge date: 06/03/2012  Discharge Diagnoses:     *Syncope and collapse Active Problems:  HIV INFECTION  DIABETES MELLITUS, TYPE II, UNCONTROLLED, W/RENAL COMPS  Hypotension     Medication List     As of 06/03/2012  7:47 AM    TAKE these medications         acetaminophen 325 MG tablet   Commonly known as: TYLENOL   Take 650 mg by mouth every 6 (six) hours as needed. For pain      emtricitabine 200 MG capsule   Commonly known as: EMTRIVA   One capsule by mouth every Tuesday, and Saturday following dialysis.      esomeprazole 40 MG capsule   Commonly known as: NEXIUM   Take 1 capsule (40 mg total) by mouth daily.      gabapentin 600 MG tablet   Commonly known as: NEURONTIN   Take 600 mg by mouth 3 (three) times daily.      insulin aspart protamine-insulin aspart (70-30) 100 UNIT/ML injection   Commonly known as: NOVOLOG 70/30   Take 15 units subcutaneously every a.m.  And 12 units subcutaneously every p.m.      insulin glargine 100 UNIT/ML injection   Commonly known as: LANTUS   Inject 15-25 Units into the skin 2 (two) times daily. 15 units in morning and 25 units in evening      midodrine 5 MG tablet   Commonly known as: PROAMATINE   Take 1 tablet (5 mg total) by mouth 3 (three) times daily with meals.      ritonavir 100 MG Tabs   Commonly known as: NORVIR   Take 1 tablet (100 mg total) by mouth daily with breakfast. must take with Prezista.      tenofovir 300 MG tablet   Commonly known as: VIREAD   One tablet by mouth weekly every Sunday.        Discharge Condition: Stable Disposition: 01-Home or Self Care   Consults: None  Significant Diagnostic Studies: Ct Head Wo Contrast  06/02/2012  *RADIOLOGY REPORT*  Clinical Data: Pain and dizziness post trauma  CT HEAD WITHOUT CONTRAST   Technique:  Contiguous axial images were obtained from the base of the skull through the vertex without contrast.  Comparison: November 23, 2010  Findings: Ventricles are normal in size and configuration.  There is no mass, hemorrhage, extra-axial fluid collection, or midline shift.  Gray-white compartments are normal.  Bony calvarium appears intact.  The mastoid air cells are clear.  IMPRESSION: Study within normal limits.   Original Report Authenticated By: Lowella Grip, M.D.    Mr Cervical Spine Wo Contrast  06/02/2012  *RADIOLOGY REPORT*  Clinical Data: Blurred vision with multiple falls over the last 3 days.  Evaluate for cervical diskitis.  MRI CERVICAL SPINE WITHOUT CONTRAST  Technique:  Multiplanar and multiecho pulse sequences of the cervical spine, to include the craniocervical junction and cervicothoracic junction, were obtained according to standard protocol without intravenous contrast.  Comparison: None.  Findings: The cervical alignment is normal.  There is no evidence of diskitis or paraspinal inflammatory change.  There is no endplate edema or destruction.  There is low signal throughout the bones which appears nonfocal.  The craniocervical junction appears normal.  The cervical cord is normal in signal and caliber.  There are bilateral vertebral artery flow voids.  Axial imaging demonstrates a small central disc protrusion at C5- C6.  There is no cord deformity.  There is no foraminal stenosis. There are no other significant disc space findings.  IMPRESSION:  1.  No evidence of cervical diskitis, osteomyelitis or paraspinal inflammatory change. 2.  Small central disc protrusion at C5-C6.  No cord deformity or foraminal compromise. 3.  Nonspecific low marrow signal throughout the visualized bones. This can be seen as a normal variant, in smokers and subsequent to anemia.   Original Report Authenticated By: Richardean Sale, M.D.      2-D echo is pending  Microbiology: Recent Results (from  the past 240 hour(s))  MRSA PCR SCREENING     Status: Normal   Collection Time   06/03/12 12:18 AM      Component Value Range Status Comment   MRSA by PCR NEGATIVE  NEGATIVE Final      Labs: Results for orders placed during the hospital encounter of 06/02/12 (from the past 48 hour(s))  CBC     Status: Abnormal   Collection Time   06/02/12 12:01 AM      Component Value Range Comment   WBC 5.1  4.0 - 10.5 K/uL    RBC 3.14 (*) 4.22 - 5.81 MIL/uL    Hemoglobin 10.1 (*) 13.0 - 17.0 g/dL    HCT 29.5 (*) 39.0 - 52.0 %    MCV 93.9  78.0 - 100.0 fL    MCH 32.2  26.0 - 34.0 pg    MCHC 34.2  30.0 - 36.0 g/dL    RDW 13.0  11.5 - 15.5 %    Platelets 164  150 - 400 K/uL   CREATININE, SERUM     Status: Abnormal   Collection Time   06/02/12 12:01 AM      Component Value Range Comment   Creatinine, Ser 5.76 (*) 0.50 - 1.35 mg/dL    GFR calc non Af Amer 11 (*) >90 mL/min    GFR calc Af Amer 13 (*) >90 mL/min   TROPONIN I     Status: Normal   Collection Time   06/02/12 12:01 AM      Component Value Range Comment   Troponin I <0.30  <0.30 ng/mL   CBC     Status: Abnormal   Collection Time   06/02/12  2:00 PM      Component Value Range Comment   WBC 6.5  4.0 - 10.5 K/uL    RBC 3.41 (*) 4.22 - 5.81 MIL/uL    Hemoglobin 10.8 (*) 13.0 - 17.0 g/dL    HCT 31.5 (*) 39.0 - 52.0 %    MCV 92.4  78.0 - 100.0 fL    MCH 31.7  26.0 - 34.0 pg    MCHC 34.3  30.0 - 36.0 g/dL    RDW 13.1  11.5 - 15.5 %    Platelets 197  150 - 400 K/uL   COMPREHENSIVE METABOLIC PANEL     Status: Abnormal   Collection Time   06/02/12  2:00 PM      Component Value Range Comment   Sodium 136  135 - 145 mEq/L    Potassium 3.7  3.5 - 5.1 mEq/L    Chloride 94 (*) 96 - 112 mEq/L    CO2 31  19 - 32 mEq/L    Glucose, Bld 83  70 - 99 mg/dL    BUN 10  6 - 23 mg/dL    Creatinine, Ser 4.20 (*) 0.50 -  1.35 mg/dL    Calcium 8.5  8.4 - 10.5 mg/dL    Total Protein 8.9 (*) 6.0 - 8.3 g/dL    Albumin 2.9 (*) 3.5 - 5.2 g/dL    AST 59  (*) 0 - 37 U/L    ALT 59 (*) 0 - 53 U/L    Alkaline Phosphatase 111  39 - 117 U/L    Total Bilirubin 0.3  0.3 - 1.2 mg/dL    GFR calc non Af Amer 16 (*) >90 mL/min    GFR calc Af Amer 19 (*) >90 mL/min   MAGNESIUM     Status: Normal   Collection Time   06/02/12  2:00 PM      Component Value Range Comment   Magnesium 2.1  1.5 - 2.5 mg/dL   CG4 I-STAT (LACTIC ACID)     Status: Normal   Collection Time   06/02/12  5:55 PM      Component Value Range Comment   Lactic Acid, Venous 0.62  0.5 - 2.2 mmol/L   GLUCOSE, CAPILLARY     Status: Abnormal   Collection Time   06/02/12  8:06 PM      Component Value Range Comment   Glucose-Capillary 130 (*) 70 - 99 mg/dL   GLUCOSE, CAPILLARY     Status: Abnormal   Collection Time   06/02/12  9:45 PM      Component Value Range Comment   Glucose-Capillary 227 (*) 70 - 99 mg/dL   GLUCOSE, CAPILLARY     Status: Abnormal   Collection Time   06/02/12 11:59 PM      Component Value Range Comment   Glucose-Capillary 280 (*) 70 - 99 mg/dL   MRSA PCR SCREENING     Status: Normal   Collection Time   06/03/12 12:18 AM      Component Value Range Comment   MRSA by PCR NEGATIVE  NEGATIVE   TROPONIN I     Status: Normal   Collection Time   06/03/12  5:20 AM      Component Value Range Comment   Troponin I <0.30  <0.30 ng/mL   GLUCOSE, CAPILLARY     Status: Abnormal   Collection Time   06/03/12  7:27 AM      Component Value Range Comment   Glucose-Capillary 200 (*) 70 - 99 mg/dL      HPI   41 year old male with a past medical history of hep C, end-stage kidney disease, HIV and diabetes who presents the emergency department part of a chief complaint of neck pain and syncope. Patient states that he had 2 episodes of syncope this weekend. He also missed his dialysis appointment yesterday. Patient went to dialysis this morning and began having neck pain and severe headache. Patient describes the headache is the worst headache and neck pain of his life. He complains  of severe posterior neck pain especially in the occipital region. He denies any visual disturbance, weakness, and difficulty with speech, difficulty walking. He denies fever, rash , photophobia or phonophobia. Patient medical records showed that he has had previous episodes of hypotension and syncope. Previous episodes were thought to be due to dehydration and volume loss from his dialysis. Patient denies any nausea vomiting or diarrhea. He has no other complaints at this time. Further evaluation in the ER showed normal WBC, Hb 10.6, normal electrolytes, BS 83 and Cr 4.2. He had a head CT without constrast, and MRI of the cervical area without any acute processes. He  was given 2 liters of IVF with resulting SBP 90, and he felt markedly better. Because of his hypotension, syncope, and his long stay in the ER (7 hours), hospitalist was asked to admit patient for further observation.   HOSPITAL COURSE:   #1 syncope and collapse Improved with IV hydration Most likely secondary to hypotension following hemodialysis We'll start the patient on midodrine Workup including cardiac enzymes, EKG, CT head, MRI of the C-spine are negative, lactic acid negative Physical and occupational therapy consultation will be obtained 2-D echo is ordered and pending at this time   #2 HIV infection Patient to continue following up with Dr. [name] No signs of infection   #3 diabetes type 2 insulin-dependent Patient to continue with his outpatient regimen no evidence of hypoglycemia  #4 hemodialysis/end-stage renal disease On hemodialysis Tuesday Thursday Saturday Had hemodialysis yesterday   Discharge Exam:   Blood pressure 92/54, pulse 88, temperature 98.5 F (36.9 C), temperature source Oral, resp. rate 17, weight 54.1 kg (119 lb 4.3 oz), SpO2 95.00%. GEN: Pleasant patient lying in the stretcher in no acute distress; cooperative with exam.  PSYCH: alert and oriented x4; does not appear anxious or  depressed; affect is appropriate.  HEENT: Mucous membranes pink and anicteric; PERRLA; EOM intact; no cervical lymphadenopathy nor thyromegaly or carotid bruit; no JVD; There were no stridor. Neck is very supple.  Breasts:: Not examined  CHEST WALL: No tenderness  CHEST: Normal respiration, clear to auscultation bilaterally.  HEART: Regular rate and rhythm. There are no murmur, rub, or gallops.  BACK: No kyphosis or scoliosis; no CVA tenderness  ABDOMEN: soft and non-tender; no masses, no organomegaly, normal abdominal bowel sounds; no pannus; no intertriginous candida. There is no rebound and no distention.  Rectal Exam: Not done  EXTREMITIES: No bone or joint deformity; age-appropriate arthropathy of the hands and knees; no edema; no ulcerations. There is no calf tenderness. AVF on his left side.  Genitalia: not examined  PULSES: 2+ and symmetric  SKIN: Normal hydration no rash or ulceration  CNS: Cranial nerves 2-12 grossly intact no focal lateralizing neurologic deficit. Speech is fluent; uvula elevated with phonation, facial symmetry and tongue midline. DTR are normal bilaterally, cerebella exam is intact, barbinski is negative and strengths are equaled bilaterally. No sensory loss.         SignedReyne Dumas 06/03/2012, 7:47 AM

## 2012-06-07 NOTE — ED Provider Notes (Signed)
Medical screening examination/treatment/procedure(s) were conducted as a shared visit with non-physician practitioner(s) and myself.  I personally evaluated the patient during the encounter  Varney Biles, MD 06/07/12 1600

## 2012-06-16 ENCOUNTER — Inpatient Hospital Stay (HOSPITAL_COMMUNITY)
Admission: EM | Admit: 2012-06-16 | Discharge: 2012-06-24 | DRG: 314 | Disposition: A | Discharge status: Home / Self Care | Payer: Medicaid Other | Attending: Internal Medicine | Admitting: Internal Medicine

## 2012-06-16 ENCOUNTER — Ambulatory Visit (INDEPENDENT_AMBULATORY_CARE_PROVIDER_SITE_OTHER): Payer: Medicaid Other | Admitting: Infectious Diseases

## 2012-06-16 ENCOUNTER — Emergency Department (HOSPITAL_COMMUNITY): Payer: Medicaid Other

## 2012-06-16 ENCOUNTER — Encounter (HOSPITAL_COMMUNITY): Payer: Self-pay | Admitting: Cardiology

## 2012-06-16 ENCOUNTER — Encounter: Payer: Self-pay | Admitting: Infectious Diseases

## 2012-06-16 VITALS — BP 74/54 | HR 96 | Temp 98.0°F | Ht 62.0 in | Wt 117.0 lb

## 2012-06-16 DIAGNOSIS — N2581 Secondary hyperparathyroidism of renal origin: Secondary | ICD-10-CM | POA: Diagnosis present

## 2012-06-16 DIAGNOSIS — E1142 Type 2 diabetes mellitus with diabetic polyneuropathy: Secondary | ICD-10-CM | POA: Diagnosis present

## 2012-06-16 DIAGNOSIS — B20 Human immunodeficiency virus [HIV] disease: Secondary | ICD-10-CM

## 2012-06-16 DIAGNOSIS — Z91199 Patient's noncompliance with other medical treatment and regimen due to unspecified reason: Secondary | ICD-10-CM

## 2012-06-16 DIAGNOSIS — E889 Metabolic disorder, unspecified: Secondary | ICD-10-CM

## 2012-06-16 DIAGNOSIS — M9080 Osteopathy in diseases classified elsewhere, unspecified site: Secondary | ICD-10-CM | POA: Diagnosis present

## 2012-06-16 DIAGNOSIS — E1149 Type 2 diabetes mellitus with other diabetic neurological complication: Secondary | ICD-10-CM | POA: Diagnosis present

## 2012-06-16 DIAGNOSIS — E119 Type 2 diabetes mellitus without complications: Secondary | ICD-10-CM

## 2012-06-16 DIAGNOSIS — R519 Headache, unspecified: Secondary | ICD-10-CM | POA: Diagnosis present

## 2012-06-16 DIAGNOSIS — E43 Unspecified severe protein-calorie malnutrition: Secondary | ICD-10-CM | POA: Diagnosis present

## 2012-06-16 DIAGNOSIS — R55 Syncope and collapse: Secondary | ICD-10-CM | POA: Diagnosis present

## 2012-06-16 DIAGNOSIS — R197 Diarrhea, unspecified: Secondary | ICD-10-CM | POA: Diagnosis present

## 2012-06-16 DIAGNOSIS — R51 Headache: Secondary | ICD-10-CM

## 2012-06-16 DIAGNOSIS — E1129 Type 2 diabetes mellitus with other diabetic kidney complication: Secondary | ICD-10-CM

## 2012-06-16 DIAGNOSIS — Z21 Asymptomatic human immunodeficiency virus [HIV] infection status: Secondary | ICD-10-CM | POA: Diagnosis present

## 2012-06-16 DIAGNOSIS — D649 Anemia, unspecified: Secondary | ICD-10-CM | POA: Diagnosis present

## 2012-06-16 DIAGNOSIS — E1165 Type 2 diabetes mellitus with hyperglycemia: Secondary | ICD-10-CM

## 2012-06-16 DIAGNOSIS — Z794 Long term (current) use of insulin: Secondary | ICD-10-CM

## 2012-06-16 DIAGNOSIS — E1122 Type 2 diabetes mellitus with diabetic chronic kidney disease: Secondary | ICD-10-CM | POA: Diagnosis present

## 2012-06-16 DIAGNOSIS — K529 Noninfective gastroenteritis and colitis, unspecified: Secondary | ICD-10-CM

## 2012-06-16 DIAGNOSIS — Z9119 Patient's noncompliance with other medical treatment and regimen: Secondary | ICD-10-CM

## 2012-06-16 DIAGNOSIS — Z79899 Other long term (current) drug therapy: Secondary | ICD-10-CM

## 2012-06-16 DIAGNOSIS — N186 End stage renal disease: Secondary | ICD-10-CM | POA: Diagnosis present

## 2012-06-16 DIAGNOSIS — B182 Chronic viral hepatitis C: Secondary | ICD-10-CM

## 2012-06-16 DIAGNOSIS — M898X9 Other specified disorders of bone, unspecified site: Secondary | ICD-10-CM | POA: Diagnosis present

## 2012-06-16 DIAGNOSIS — B1920 Unspecified viral hepatitis C without hepatic coma: Secondary | ICD-10-CM | POA: Diagnosis present

## 2012-06-16 DIAGNOSIS — I959 Hypotension, unspecified: Secondary | ICD-10-CM

## 2012-06-16 DIAGNOSIS — N189 Chronic kidney disease, unspecified: Secondary | ICD-10-CM | POA: Diagnosis present

## 2012-06-16 DIAGNOSIS — D631 Anemia in chronic kidney disease: Secondary | ICD-10-CM | POA: Diagnosis present

## 2012-06-16 DIAGNOSIS — N039 Chronic nephritic syndrome with unspecified morphologic changes: Secondary | ICD-10-CM | POA: Diagnosis present

## 2012-06-16 DIAGNOSIS — Z992 Dependence on renal dialysis: Secondary | ICD-10-CM

## 2012-06-16 HISTORY — DX: Metabolic disorder, unspecified: E88.9

## 2012-06-16 HISTORY — DX: Osteopathy in diseases classified elsewhere, unspecified site: M90.80

## 2012-06-16 HISTORY — DX: Unspecified severe protein-calorie malnutrition: E43

## 2012-06-16 HISTORY — DX: Noninfective gastroenteritis and colitis, unspecified: K52.9

## 2012-06-16 HISTORY — DX: Dependence on renal dialysis: Z99.2

## 2012-06-16 HISTORY — DX: Human immunodeficiency virus (HIV) disease: B20

## 2012-06-16 HISTORY — DX: Anemia, unspecified: D64.9

## 2012-06-16 HISTORY — DX: Hypotension, unspecified: I95.9

## 2012-06-16 HISTORY — DX: End stage renal disease: N18.6

## 2012-06-16 LAB — BASIC METABOLIC PANEL
CO2: 29 mEq/L (ref 19–32)
Chloride: 99 mEq/L (ref 96–112)
Glucose, Bld: 102 mg/dL — ABNORMAL HIGH (ref 70–99)
Sodium: 136 mEq/L (ref 135–145)

## 2012-06-16 LAB — CBC
HCT: 34.2 % — ABNORMAL LOW (ref 39.0–52.0)
Hemoglobin: 11.4 g/dL — ABNORMAL LOW (ref 13.0–17.0)
MCH: 31 pg (ref 26.0–34.0)
MCV: 92.9 fL (ref 78.0–100.0)
RBC: 3.68 MIL/uL — ABNORMAL LOW (ref 4.22–5.81)
WBC: 6.5 10*3/uL (ref 4.0–10.5)

## 2012-06-16 MED ORDER — ACETAMINOPHEN 325 MG PO TABS
650.0000 mg | ORAL_TABLET | Freq: Once | ORAL | Status: AC
  Administered 2012-06-16: 650 mg via ORAL
  Filled 2012-06-16: qty 2

## 2012-06-16 MED ORDER — SODIUM CHLORIDE 0.9 % IV BOLUS (SEPSIS)
500.0000 mL | Freq: Once | INTRAVENOUS | Status: AC
  Administered 2012-06-16: 500 mL via INTRAVENOUS

## 2012-06-16 NOTE — ED Notes (Signed)
Pt requested to ambulate to restroom - NT attempted to assist pt to restroom however upon standing pt c/o dizziness and continued headache - pt assisted to chair and BP rechecked, Dr. Ashok Cordia at bedside and re-evaluated pt. Pt found to be hypotensive w/ BP of 63/40 on assessment. Pt assisted back to bed, orders given to start 521ml NS bolus. Pt remained A&Ox4 throughout event. Pt in no acute distress.

## 2012-06-16 NOTE — ED Notes (Signed)
Patient transported to X-ray 

## 2012-06-16 NOTE — Progress Notes (Signed)
Interpreter Lesle Chris for Dr [name]  At  [facility]

## 2012-06-16 NOTE — Assessment & Plan Note (Signed)
His FSG is quite low today. Will have him seen in ED for triage, appropriate bed for him to be admitted to. I do not feel safe sending him home. I explained this to him.

## 2012-06-16 NOTE — Assessment & Plan Note (Signed)
He remains hypotensive, probably related to his diarrhea, as well as his dialysis. Will send him to the ED for appropriate placement, hydration.

## 2012-06-16 NOTE — Assessment & Plan Note (Signed)
He is currently off his meds. Will re-evaluate this after he has been stabilized.

## 2012-06-16 NOTE — ED Notes (Signed)
Pt to department via EMS from MD office- went to office to be evaluated and was noted to be hypotensive at the office. Bp-76/48 Cbg-214. Hr-100 Reports he has been having episodes of diarrhea at home. 20g Right forearm, fistula on the left. 368ml en route.

## 2012-06-16 NOTE — ED Notes (Signed)
Patient transported to CT 

## 2012-06-16 NOTE — ED Provider Notes (Addendum)
History     CSN: [id number]  Arrival date & time 06/16/12  1646   First MD Initiated Contact with Patient 06/16/12 1712      Chief Complaint  Patient presents with  . Hypotension    (Consider location/radiation/quality/duration/timing/severity/associated sxs/prior treatment) The history is provided by the patient.  pt with hx hiv, esrd on hd, dm, presents to ED from medical/ID clinic where he showed up after dialyses today with low blood pressure.  No fever or chills. No palpitations. No chest pain or sob. No abd pain. No vomiting or diarrhea. No blood loss or rectal bleeding. Had normal dialyses today. States bp has been low occasionally after dialyses in past.     Past Medical History  Diagnosis Date  . Renal insufficiency   . Diabetes mellitus   . Hepatitis C   . Dialysis patient   . MRSA infection   . HIV (human immunodeficiency virus infection)   . Pancreatitis     Past Surgical History  Procedure Date  . No past surgeries     Family History  Problem Relation Age of Onset  . Diabetes Mother   . Diabetes Father     History  Substance Use Topics  . Smoking status: Never Smoker   . Smokeless tobacco: Never Used  . Alcohol Use: No      Review of Systems  Constitutional: Negative for fever.  HENT: Negative for neck pain.   Eyes: Negative for redness.  Respiratory: Negative for cough and shortness of breath.   Cardiovascular: Negative for chest pain.  Gastrointestinal: Negative for abdominal pain.  Genitourinary: Negative for flank pain.  Musculoskeletal: Negative for myalgias.  Skin: Negative for rash.  Neurological: Negative for headaches.  Hematological: Does not bruise/bleed easily.  Psychiatric/Behavioral: Negative for confusion.    Allergies  Review of patient's allergies indicates no known allergies.  Home Medications   Current Outpatient Rx  Name  Route  Sig  Dispense  Refill  . ACETAMINOPHEN 325 MG PO TABS   Oral   Take 650 mg by  mouth every 6 (six) hours as needed. For pain         . EMTRICITABINE 200 MG PO CAPS      One capsule by mouth every Tuesday, and Saturday following dialysis.   10 capsule   5   . ESOMEPRAZOLE MAGNESIUM 40 MG PO CPDR   Oral   Take 1 capsule (40 mg total) by mouth daily.   15 capsule   0     Samples given to patient    Lot#    [id number]        ...   . GABAPENTIN 600 MG PO TABS   Oral   Take 600 mg by mouth 3 (three) times daily.         . INSULIN ASPART PROT & ASPART (70-30) 100 UNIT/ML [place] SUSP      Take 15 units subcutaneously every a.m.  And 12 units subcutaneously every p.m.   10 mL   12   . MIDODRINE HCL 5 MG PO TABS   Oral   Take 1 tablet (5 mg total) by mouth 3 (three) times daily with meals.   90 tablet   2   . RITONAVIR 100 MG PO TABS   Oral   Take 1 tablet (100 mg total) by mouth daily with breakfast. must take with Prezista.   30 tablet   5   . TENOFOVIR DISOPROXIL FUMARATE 300 MG PO TABS  One tablet by mouth weekly every Sunday.   5 tablet   5     BP 100/58  Pulse 97  Temp 97.1 F (36.2 C) (Oral)  Resp 14  SpO2 99%  Physical Exam  Nursing note and vitals reviewed. Constitutional: He is oriented to person, place, and time. He appears well-developed and well-nourished. No distress.  HENT:  Head: Atraumatic.  Nose: Nose normal.  Mouth/Throat: Oropharynx is clear and moist.  Eyes: Conjunctivae normal are normal. Pupils are equal, round, and reactive to light. No scleral icterus.  Neck: Normal range of motion. Neck supple. No tracheal deviation present.  Cardiovascular: Normal rate, regular rhythm, normal heart sounds and intact distal pulses.   Pulmonary/Chest: Effort normal and breath sounds normal. No accessory muscle usage. No respiratory distress.  Abdominal: Soft. Bowel sounds are normal. He exhibits no distension and no mass. There is no tenderness. There is no guarding.  Genitourinary:       No cva tenderness  Musculoskeletal:  Normal range of motion. He exhibits no edema and no tenderness.       Dialyses graft right forearm w palp thrill  Neurological: He is alert and oriented to person, place, and time.       Motor intact bil.   Skin: Skin is warm and dry.  Psychiatric: He has a normal mood and affect.    ED Course  Procedures (including critical care time)   Results for orders placed during the hospital encounter of [id number]  BASIC METABOLIC PANEL      Component Value Range   Sodium 136  135 - 145 mEq/L   Potassium 4.0  3.5 - 5.1 mEq/L   Chloride 99  96 - 112 mEq/L   CO2 29  19 - 32 mEq/L   Glucose, Bld 102 (*) 70 - 99 mg/dL   BUN 19  6 - 23 mg/dL   Creatinine, Ser 5.71 (*) 0.50 - 1.35 mg/dL   Calcium 8.8  8.4 - 10.5 mg/dL   GFR calc non Af Amer 11 (*) >90 mL/min   GFR calc Af Amer 13 (*) >90 mL/min  CBC      Component Value Range   WBC 6.5  4.0 - 10.5 K/uL   RBC 3.68 (*) 4.22 - 5.81 MIL/uL   Hemoglobin 11.4 (*) 13.0 - 17.0 g/dL   HCT 34.2 (*) 39.0 - 52.0 %   MCV 92.9  78.0 - 100.0 fL   MCH 31.0  26.0 - 34.0 pg   MCHC 33.3  30.0 - 36.0 g/dL   RDW 13.1  11.5 - 15.5 %   Platelets 196  150 - 400 K/uL   Ct Head Wo Contrast  06/02/2012  *RADIOLOGY REPORT*  Clinical Data: Pain and dizziness post trauma  CT HEAD WITHOUT CONTRAST  Technique:  Contiguous axial images were obtained from the base of the skull through the vertex without contrast.  Comparison: November 23, 2010  Findings: Ventricles are normal in size and configuration.  There is no mass, hemorrhage, extra-axial fluid collection, or midline shift.  Gray-white compartments are normal.  Bony calvarium appears intact.  The mastoid air cells are clear.  IMPRESSION: Study within normal limits.   Original Report Authenticated By: Lowella Grip, M.D.    Mr Cervical Spine Wo Contrast  06/02/2012  *RADIOLOGY REPORT*  Clinical Data: Blurred vision with multiple falls over the last 3 days.  Evaluate for cervical diskitis.  MRI CERVICAL SPINE WITHOUT  CONTRAST  Technique:  Multiplanar and multiecho pulse  sequences of the cervical spine, to include the craniocervical junction and cervicothoracic junction, were obtained according to standard protocol without intravenous contrast.  Comparison: None.  Findings: The cervical alignment is normal.  There is no evidence of diskitis or paraspinal inflammatory change.  There is no endplate edema or destruction.  There is low signal throughout the bones which appears nonfocal.  The craniocervical junction appears normal.  The cervical cord is normal in signal and caliber.  There are bilateral vertebral artery flow voids.  Axial imaging demonstrates a small central disc protrusion at C5- C6.  There is no cord deformity.  There is no foraminal stenosis. There are no other significant disc space findings.  IMPRESSION:  1.  No evidence of cervical diskitis, osteomyelitis or paraspinal inflammatory change. 2.  Small central disc protrusion at C5-C6.  No cord deformity or foraminal compromise. 3.  Nonspecific low marrow signal throughout the visualized bones. This can be seen as a normal variant, in smokers and subsequent to anemia.   Original Report Authenticated By: Richardean Sale, M.D.       MDM  Reviewed nursing notes and prior charts for additional history.   Dr [name] had called, indicated pt was seen in their clinic today after dialyses, was noted to have low blood pressure, and was sent to ED for eval.  Pt received small ns bolus iv.   bp currently improved.  hgb c/w baseline. Glucose normal. bp normal. No faintness or dizziness.  Hx obtained via interpreter line - pt denies any diarrhea. No abd pain. No nv. No diarrhea in ed. Pt ate meal. Recheck pt comfortable.   Discussed w teaching service ?tsb patient - they reviewed records, state pt is not a clinic pt, is unassigned, and they are already capped.   Family practice called-  They indicate capped.  hospitalist service called to admit, given low  bp, for obs and facilitation of medical follow up.   [facility] hospitalist MD, Dr Alcario Drought, evaluated pt in ed, reviewed prior notes, recent d/c from their service - states long hx hypotension, esp post hd.   He indicates extensive workup for same, including recent hospital stay.  bp now c/w pts baseline, he requests d/c home.  Recheck pt eating and drinking. Ambulatory to bathroom. bp 95/65. Labs c/w baseline. Will have pt f/u his ID md in next 1-2 days.  Also rec close pcp f/u.       Mirna Mires, MD 06/17/12 [id number]  Mirna Mires, MD 06/17/12 [phone]

## 2012-06-16 NOTE — Progress Notes (Signed)
  Subjective:    Patient ID: Alvin Daniels, male    DOB: 10-14-1970, 42 y.o.   MRN: [id number]  HPI 42 yo M with DM, ESRD, and HIV+. Was in hospital in June 2012 with MRSA bacteremia. Was admitted 1-21 to 06-03-12 after 2 syncopal episodes the prior weekend. He had also missed HD. He was found to have SBP 56 in ED. He was admitted for observation (and TTE showing no wall abnormalities).  Today comes to clinic for f/u- his FSG is 39, SBP 74/54. He complains of headaches in clinic today. My stomach does not allow any food to get in, and "this diarrhea is killing me". Says he is going to bathroom every 15 minutes until everything that he eats has come out (then stomach is quiet). No emesis.  Has been feeling cold. Had HD this AM without any problems. Off all medicines (was supposed to be sent to him but hasn't come yet). "the clinic where i get the insulin is closed".    Review of Systems  Constitutional: Positive for appetite change. Negative for fever and chills.  Eyes: Positive for visual disturbance.  Respiratory: Negative for shortness of breath.   Cardiovascular: Negative for chest pain.  Gastrointestinal: Positive for diarrhea. Negative for nausea and constipation.  Genitourinary: Negative for dysuria.  Musculoskeletal: Positive for back pain.  Neurological: Positive for headaches. Negative for light-headedness.       Objective:   Physical Exam  Constitutional: He appears well-developed and well-nourished.  HENT:  Mouth/Throat: Oropharyngeal exudate present.  Eyes: EOM are normal. Pupils are equal, round, and reactive to light.  Neck: Neck supple.  Cardiovascular: Normal rate, regular rhythm and normal heart sounds.   Pulmonary/Chest: Effort normal and breath sounds normal.  Abdominal: Soft. Bowel sounds are normal. There is no tenderness. There is no rebound and no guarding.  Musculoskeletal: He exhibits no edema.  Lymphadenopathy:    He has no cervical adenopathy.  Skin:              Assessment & Plan:

## 2012-06-17 ENCOUNTER — Encounter (HOSPITAL_COMMUNITY): Payer: Self-pay | Admitting: *Deleted

## 2012-06-17 ENCOUNTER — Other Ambulatory Visit: Payer: Self-pay | Admitting: *Deleted

## 2012-06-17 DIAGNOSIS — B20 Human immunodeficiency virus [HIV] disease: Secondary | ICD-10-CM

## 2012-06-17 DIAGNOSIS — D649 Anemia, unspecified: Secondary | ICD-10-CM | POA: Diagnosis present

## 2012-06-17 DIAGNOSIS — E43 Unspecified severe protein-calorie malnutrition: Secondary | ICD-10-CM | POA: Diagnosis present

## 2012-06-17 DIAGNOSIS — E1129 Type 2 diabetes mellitus with other diabetic kidney complication: Secondary | ICD-10-CM

## 2012-06-17 DIAGNOSIS — R55 Syncope and collapse: Secondary | ICD-10-CM

## 2012-06-17 DIAGNOSIS — N186 End stage renal disease: Secondary | ICD-10-CM

## 2012-06-17 DIAGNOSIS — K529 Noninfective gastroenteritis and colitis, unspecified: Secondary | ICD-10-CM | POA: Diagnosis present

## 2012-06-17 HISTORY — DX: Unspecified severe protein-calorie malnutrition: E43

## 2012-06-17 HISTORY — DX: Anemia, unspecified: D64.9

## 2012-06-17 LAB — BASIC METABOLIC PANEL
BUN: 27 mg/dL — ABNORMAL HIGH (ref 6–23)
CO2: 23 mEq/L (ref 19–32)
GFR calc non Af Amer: 8 mL/min — ABNORMAL LOW (ref >90)
Glucose, Bld: 147 mg/dL — ABNORMAL HIGH (ref 70–99)
Potassium: 3.9 mEq/L (ref 3.5–5.1)

## 2012-06-17 LAB — CBC
HCT: 30.1 % — ABNORMAL LOW (ref 39.0–52.0)
HCT: 31.1 % — ABNORMAL LOW (ref 39.0–52.0)
Hemoglobin: 10.2 g/dL — ABNORMAL LOW (ref 13.0–17.0)
Hemoglobin: 10.6 g/dL — ABNORMAL LOW (ref 13.0–17.0)
MCH: 31.5 pg (ref 26.0–34.0)
MCHC: 33.9 g/dL (ref 30.0–36.0)
MCV: 92.9 fL (ref 78.0–100.0)
MCV: 92 fL (ref 78.0–100.0)
Platelets: 176 10*3/uL (ref 150–400)
RBC: 3.24 MIL/uL — ABNORMAL LOW (ref 4.22–5.81)
RBC: 3.38 MIL/uL — ABNORMAL LOW (ref 4.22–5.81)
WBC: 6.1 10*3/uL (ref 4.0–10.5)

## 2012-06-17 LAB — GLUCOSE, CAPILLARY
Glucose-Capillary: 159 mg/dL — ABNORMAL HIGH (ref 70–99)
Glucose-Capillary: 139 mg/dL — ABNORMAL HIGH (ref 70–99)
Glucose-Capillary: 149 mg/dL — ABNORMAL HIGH (ref 70–99)

## 2012-06-17 LAB — TROPONIN I: Troponin I: <0.3 ng/mL (ref <0.30)

## 2012-06-17 LAB — CREATININE, SERUM: Creatinine, Ser: 6.58 mg/dL — ABNORMAL HIGH (ref 0.50–1.35)

## 2012-06-17 MED ORDER — DIPHENOXYLATE-ATROPINE 2.5-0.025 MG PO TABS
2.0000 | ORAL_TABLET | Freq: Four times a day (QID) | ORAL | Status: DC
  Administered 2012-06-17 – 2012-06-22 (×12): 2 via ORAL
  Filled 2012-06-17 (×13): qty 2

## 2012-06-17 MED ORDER — HEPARIN SODIUM (PORCINE) 5000 UNIT/ML IJ SOLN
5000.0000 [IU] | Freq: Three times a day (TID) | INTRAMUSCULAR | Status: DC
  Administered 2012-06-17 – 2012-06-24 (×20): 5000 [IU] via SUBCUTANEOUS
  Filled 2012-06-17 (×26): qty 1

## 2012-06-17 MED ORDER — INSULIN ASPART 100 UNIT/ML ~~LOC~~ SOLN
0.0000 [IU] | Freq: Three times a day (TID) | SUBCUTANEOUS | Status: DC
  Administered 2012-06-17 – 2012-06-18 (×5): 1 [IU] via SUBCUTANEOUS
  Administered 2012-06-19: 3 [IU] via SUBCUTANEOUS
  Administered 2012-06-19 (×2): 1 [IU] via SUBCUTANEOUS
  Administered 2012-06-20: 2 [IU] via SUBCUTANEOUS
  Administered 2012-06-20 – 2012-06-21 (×3): 1 [IU] via SUBCUTANEOUS
  Administered 2012-06-21: 2 [IU] via SUBCUTANEOUS
  Administered 2012-06-21: 1 [IU] via SUBCUTANEOUS
  Administered 2012-06-22: 2 [IU] via SUBCUTANEOUS
  Administered 2012-06-22: 1 [IU] via SUBCUTANEOUS
  Administered 2012-06-23: 2 [IU] via SUBCUTANEOUS
  Administered 2012-06-24 (×2): 1 [IU] via SUBCUTANEOUS

## 2012-06-17 MED ORDER — SODIUM CHLORIDE 0.9 % IV SOLN
INTRAVENOUS | Status: DC
  Administered 2012-06-17 (×2): via INTRAVENOUS

## 2012-06-17 MED ORDER — MIDODRINE HCL 5 MG PO TABS
5.0000 mg | ORAL_TABLET | Freq: Three times a day (TID) | ORAL | Status: DC
  Administered 2012-06-17 – 2012-06-19 (×7): 5 mg via ORAL
  Filled 2012-06-17 (×11): qty 1

## 2012-06-17 MED ORDER — ACETAMINOPHEN 325 MG PO TABS
650.0000 mg | ORAL_TABLET | ORAL | Status: DC | PRN
  Administered 2012-06-17 – 2012-06-21 (×6): 650 mg via ORAL
  Filled 2012-06-17 (×7): qty 2

## 2012-06-17 MED ORDER — NEPRO/CARBSTEADY PO LIQD
237.0000 mL | Freq: Every day | ORAL | Status: DC
  Administered 2012-06-17: 237 mL via ORAL

## 2012-06-17 MED ORDER — DOXERCALCIFEROL 4 MCG/2ML IV SOLN
1.0000 ug | INTRAVENOUS | Status: DC
  Administered 2012-06-18 – 2012-06-23 (×3): 1 ug via INTRAVENOUS
  Filled 2012-06-17 (×3): qty 2

## 2012-06-17 MED ORDER — SODIUM CHLORIDE 0.9 % IJ SOLN
3.0000 mL | Freq: Two times a day (BID) | INTRAMUSCULAR | Status: DC
  Administered 2012-06-17 – 2012-06-24 (×14): 3 mL via INTRAVENOUS
  Filled 2012-06-17: qty 3

## 2012-06-17 NOTE — Progress Notes (Signed)
INITIAL NUTRITION ASSESSMENT  DOCUMENTATION CODES Per approved criteria  -Not Applicable   INTERVENTION: 1. Nepro Shake po daily, each supplement provides 425 kcal and 19 grams protein. 2. Recommend Rena-Vit 3. RD to continue to follow nutrition care plan  NUTRITION DIAGNOSIS: Increased nutrient needs related to HIV and ESRD as evidenced by estimated needs.   Goal: Pt to meet >/= 90% of their estimated nutrition needs.  Monitor:  weight trends, lab trends, I/O's, PO intake, supplement tolerance  Reason for Assessment: Malnutrition Screening  42 y.o. male  Admitting Dx: hypotension  ASSESSMENT: Admitted with hypotension s/p HD. Noncompliant with medications for blood pressure and HIV. Hx of chronic diarrhea.  Discussed nutrition hx via Interpreter [phone] [facility] [employer].) Per pt, his intake is variable - sometimes he has access to food, sometimes he does not. States that his appetite is good when he does eat. Denies weight loss (this is confirmed with EPIC weight hx.)  RD to add supplements to increase oral intake to prevent weight loss, pt is at nutrition risk given HIV and need for ongoing HD.  Height: Ht Readings from Last 1 Encounters:  06/17/12 5\' 2"  (1.575 m)    Weight: Wt Readings from Last 1 Encounters:  06/17/12 115 lb 6.4 oz (52.345 kg)    Ideal Body Weight: 118 lb  % Ideal Body Weight: 97%  Wt Readings from Last 10 Encounters:  06/17/12 115 lb 6.4 oz (52.345 kg)  06/16/12 117 lb (53.071 kg)  06/02/12 119 lb 4.3 oz (54.1 kg)  07/17/11 112 lb (50.803 kg)  07/10/11 112 lb 11.2 oz (51.12 kg)  07/01/11 114 lb (51.71 kg)  03/29/11 106 lb 12.8 oz (48.444 kg)  12/12/10 103 lb (46.72 kg)  11/28/10 105 lb 12.8 oz (47.991 kg)  10/26/10 104 lb (47.174 kg)    Usual Body Weight: 117 - 119 lb  % Usual Body Weight: 97%  BMI:  Body mass index is 21.11 kg/(m^2). Weight is WNL.  Estimated Nutritional Needs: Kcal: 1600 - 1800 kcal Protein: 65 - 75  grams Fluid: 1.2 liters daily  Skin: abrasions  Diet Order: Renal 60-70; 1200 ml fluid restriction  EDUCATION NEEDS: -No education needs identified at this time   Intake/Output Summary (Last 24 hours) at 06/17/12 0956 Last data filed at 06/17/12 0315  Gross per 24 hour  Intake    220 ml  Output      0 ml  Net    220 ml    Last BM: PTA  Labs:   Lab 06/17/12 0643 06/17/12 0056 06/16/12 1836  NA 135 -- 136  K 3.9 -- 4.0  CL 102 -- 99  CO2 23 -- 29  BUN 27* -- 19  CREATININE 7.30* 6.58* 5.71*  CALCIUM 8.5 -- 8.8  MG -- -- --  PHOS -- -- --  GLUCOSE 147* -- 102*    CBG (last 3)   Basename 06/17/12 0742 06/17/12 0308  GLUCAP 139* 159*    Scheduled Meds:   . heparin  5,000 Units Subcutaneous Q8H  . insulin aspart  0-9 Units Subcutaneous TID WC  . midodrine  5 mg Oral TID WC  . sodium chloride  3 mL Intravenous Q12H    Continuous Infusions:   . sodium chloride 50 mL/hr at 06/17/12 0119    Past Medical History  Diagnosis Date  . Renal insufficiency   . Diabetes mellitus   . Hepatitis C   . Dialysis patient   . MRSA infection   . HIV (  human immunodeficiency virus infection)   . Pancreatitis     Past Surgical History  Procedure Date  . No past surgeries     Inda Coke MS, RD, LDN Pager: [phone] After-hours pager: [phone]

## 2012-06-17 NOTE — Progress Notes (Signed)
[facility] PROGRESS NOTE  Alvin Daniels [id number] DOB: 1971/01/11 DOA: 06/16/2012 PCP: Default, Provider, MD  Brief narrative: Alvin Daniels is a 42 year old man with past medical history of HIV, hepatitis C, end-stage renal disease, orthostatic hypotension treated with midodrine, and chronic diarrhea (C. difficile PCR studies -07/15/2011. No ova or parasites seen) who was admitted on 06/17/2012 with hypotension.  Assessment/Plan: Principal Problem:  *Hypotension -Exacerbated by inability to afford chronic medications to treat this (Midodrine). -Chronic diarrhea likely contributory. -Continue gentle IV fluids. Active Problems:  Normocytic anemia -Anemia secondary to end-stage renal disease. Hemoglobin stable. No current indication for transfusion.  Severe protein calorie malnutrition -Seen by dietitian 06/17/2012. Continue Nepro shakes.  HIV INFECTION -Last CD4 count done 03/29/2011:294. -Spoke with Dr. [name] who recommends repeating CD4 testing and getting an HIV viral load and genotype. -Has not been on any HIV therapy.  RENAL FAILURE, END STAGE, secondary to diabetic nephropathy -Gets dialysis at the [facility]: Tu/Th/Sat. -Will notify nephrology of the patient's admission.  DM -CBGs 139-159. Continue insulin sensitive sliding scale.  Chronic diarrhea -Workup in the past included fecal studies for ova and parasites as well as C. difficile testing. -No recent exams, so will get stool cultures, stool for C. difficile PCR testing and Microsporidia stain, and fecal lactoferrin. -We'll start empiric Lomotil. Status post upper endoscopy and colonoscopy 07/17/2011 with gastric pathology negative for H.   pylori, esophageal biopsy positive for some inflammation, and colonic mucosa biopsy negative.  Code Status: Full.  Family Communication: None present. Disposition Plan: Home when stable.   Medical Consultants:  Telephone consultation with Dr.  [name]  Other Consultants:  Dietitian  Anti-infectives:  None.  HPI/Subjective: Alvin Daniels tells me he is sad.  He tells me he does not have any family, that they are all in [place]. He tells me he cannot afford to buy himself food or pay for his medications. He continues to have diarrhea. His appetite is fair.  He does complain of chest pain.  Objective: Filed Vitals:   06/17/12 0201 06/17/12 0312 06/17/12 0315 06/17/12 0623  BP:  73/52  80/45  Pulse: 80 88  86  Temp:  98.7 F (37.1 C)  98.5 F (36.9 C)  TempSrc:  Oral  Oral  Resp:  17  16  Height:   5\' 2"  (1.575 m)   Weight:  52.345 kg (115 lb 6.4 oz)    SpO2: 98% 96%  100%    Intake/Output Summary (Last 24 hours) at 06/17/12 1317 Last data filed at 06/17/12 0315  Gross per 24 hour  Intake    220 ml  Output      0 ml  Net    220 ml    Exam: Gen:  NAD Cardiovascular:  RRR, No M/R/G Respiratory:  Lungs CTAB Gastrointestinal:  Abdomen soft, NT/ND, + BS Extremities:  No C/E/C  Data Reviewed: Basic Metabolic Panel:  Lab [id number] 0643 06/17/12 0056 06/16/12 1836  NA 135 -- 136  K 3.9 -- 4.0  CL 102 -- 99  CO2 23 -- 29  GLUCOSE 147* -- 102*  BUN 27* -- 19  CREATININE 7.30* 6.58* 5.71*  CALCIUM 8.5 -- 8.8  MG -- -- --  PHOS -- -- --   GFR Estimated Creatinine Clearance: 9.9 ml/min (by C-G formula based on Cr of 7.3).  CBC:  Lab 06/17/12 0643 06/17/12 0056 06/16/12 1836  WBC 6.1 5.9 6.5  NEUTROABS -- -- --  HGB 10.6* 10.2* 11.4*  HCT 31.1*  30.1* 34.2*  MCV 92.0 92.9 92.9  PLT 176 167 196   Cardiac Enzymes:  Lab 06/17/12 0643 06/17/12 0100  CKTOTAL -- --  CKMB -- --  CKMBINDEX -- --  TROPONINI <0.30 <0.30   CBG:  Lab 06/17/12 1202 06/17/12 0742 06/17/12 0308  GLUCAP 150* 139* 159*    Procedures and Diagnostic Studies: Dg Chest 2 View  06/16/2012  *RADIOLOGY REPORT*  Clinical Data: Low blood pressure and dizziness.  CHEST - 2 VIEW  Comparison: 10/08/2011  Findings: Slightly  shallow inspiration.  Normal heart size and pulmonary vascularity.  Central interstitial changes and peribronchial thickening suggesting chronic bronchitis.  No evidence of focal consolidation or airspace disease.  No blunting of costophrenic angles.  No pneumothorax.  Mediastinal contours appear intact.  No significant changes since the previous study.  IMPRESSION: Chronic bronchitic changes.  No evidence of active pulmonary disease.   Original Report Authenticated By: Lucienne Capers, M.D.    Ct Head Wo Contrast  06/16/2012  *RADIOLOGY REPORT*  Clinical Data: Headache and hypotensive since dialysis.  CT HEAD WITHOUT CONTRAST  Technique:  Contiguous axial images were obtained from the base of the skull through the vertex without contrast.  Comparison: 06/02/2012  Findings: There is no intra or extra-axial fluid collection or mass lesion.  The basilar cisterns and ventricles have a normal appearance.  There is no CT evidence for acute infarction or hemorrhage.  Bone windows show no calvarial fracture.  Paranasal sinuses are clear.  IMPRESSION: Negative exam.   Original Report Authenticated By: Nolon Nations, M.D.    Scheduled Meds:    . diphenoxylate-atropine  2 tablet Oral QID  . feeding supplement (NEPRO CARB STEADY)  237 mL Oral Daily  . heparin  5,000 Units Subcutaneous Q8H  . insulin aspart  0-9 Units Subcutaneous TID WC  . midodrine  5 mg Oral TID WC  . sodium chloride  3 mL Intravenous Q12H   Continuous Infusions:    . sodium chloride 50 mL/hr at 06/17/12 0119    Time spent: 35 minutes.   LOS: 1 day   AlvinDaniels  [facility] Pager [phone].  If 8PM-8AM, please contact night-coverage at [email or web address], password [facility] 06/17/2012, 1:17 PM

## 2012-06-17 NOTE — Care Management Note (Signed)
   CARE MANAGEMENT NOTE 06/17/2012  Patient:  Alvin Daniels, Alvin Daniels   Account Number:  [id number]  Date Initiated:  06/17/2012  Documentation initiated by:  Demetrio Leighty  Subjective/Objective Assessment:   Referral for assistance with medications.     Action/Plan:   Noted that pt has insurance with [place] Medicaid, therefore has copay of $3-4 per prescription. Not eligible for further assistance.   Anticipated DC Date:  06/18/2012   Anticipated DC Plan:           Choice offered to / List presented to:             Status of service:  Completed, signed off Medicare Important Message given?   (If response is "NO", the following Medicare IM given date fields will be blank) Date Medicare IM given:   Date Additional Medicare IM given:    Discharge Disposition:    Per UR Regulation:    If discussed at Long Length of Stay Meetings, dates discussed:    Comments:

## 2012-06-17 NOTE — H&P (Signed)
[facility] History and Physical  Alvin Daniels [id number] DOB: 03-29-1971 DOA: 06/16/2012  Referring physician: ED PCP: Default, Provider, MD  Specialists: None  Chief Complaint: Hypotension  HPI: Alvin Daniels is a 42 y.o. male who is sent over to the ED from Dr. Algis Downs office today after he was noted to be hypotensive and dizzy following his dialysis this morning.  The patient has a long history of the same dating back at least 2 years (with similar admissions in 2012) and most recently 2 weeks ago.  Unfortunately his symptoms are not helped by the fact that he apparently has not received any of his meds (including the midodrine he is supposed to be on to treat this).  In the ED he was noted to be hypotensive as low as the 60s SBP, this improved to 80s SBP lying down with 1L of NS but he still drops BP when trying to stand up so will admit.  Review of Systems: Positive for chronic diarrhea, headache, 12 systems reviewed and otherwise negative.  Past Medical History  Diagnosis Date  . Renal insufficiency   . Diabetes mellitus   . Hepatitis C   . Dialysis patient   . MRSA infection   . HIV (human immunodeficiency virus infection)   . Pancreatitis    Past Surgical History  Procedure Date  . No past surgeries    Social History:  reports that he has never smoked. He has never used smokeless tobacco. He reports that he does not drink alcohol or use illicit drugs.   No Known Allergies  Family History  Problem Relation Age of Onset  . Diabetes Mother   . Diabetes Father     Prior to Admission medications   Not on File   Physical Exam: Filed Vitals:   06/16/12 2330 06/16/12 2345 06/17/12 0000 06/17/12 0015  BP: 82/48 80/44 79/50  96/65  Pulse: 85 81 85 84  Temp:      TempSrc:      Resp:      SpO2: 96% 96% 96% 98%    General:  NAD, resting comfortably in bed Eyes: PEERLA EOMI ENT: mucous membranes moist Neck: supple w/o  JVD Cardiovascular: RRR w/o MRG Respiratory: CTA B Abdomen: soft, nt, nd, bs+ Skin: no rash nor lesion Musculoskeletal: MAE, full ROM all 4 extremities Psychiatric: normal tone and affect Neurologic: AAOx3, grossly non-focal  Labs on Admission:  Basic Metabolic Panel:  Lab [id number] 1836  NA 136  K 4.0  CL 99  CO2 29  GLUCOSE 102*  BUN 19  CREATININE 5.71*  CALCIUM 8.8  MG --  PHOS --   Liver Function Tests: No results found for this basename: AST:5,ALT:5,ALKPHOS:5,BILITOT:5,PROT:5,ALBUMIN:5 in the last 168 hours No results found for this basename: LIPASE:5,AMYLASE:5 in the last 168 hours No results found for this basename: AMMONIA:5 in the last 168 hours CBC:  Lab 06/16/12 1836  WBC 6.5  NEUTROABS --  HGB 11.4*  HCT 34.2*  MCV 92.9  PLT 196   Cardiac Enzymes: No results found for this basename: CKTOTAL:5,CKMB:5,CKMBINDEX:5,TROPONINI:5 in the last 168 hours  BNP (last 3 results) No results found for this basename: PROBNP:3 in the last 8760 hours CBG: No results found for this basename: GLUCAP:5 in the last 168 hours  Radiological Exams on Admission: Dg Chest 2 View  06/16/2012  *RADIOLOGY REPORT*  Clinical Data: Low blood pressure and dizziness.  CHEST - 2 VIEW  Comparison: 10/08/2011  Findings: Slightly shallow inspiration.  Normal heart size and pulmonary  vascularity.  Central interstitial changes and peribronchial thickening suggesting chronic bronchitis.  No evidence of focal consolidation or airspace disease.  No blunting of costophrenic angles.  No pneumothorax.  Mediastinal contours appear intact.  No significant changes since the previous study.  IMPRESSION: Chronic bronchitic changes.  No evidence of active pulmonary disease.   Original Report Authenticated By: Lucienne Capers, M.D.    Ct Head Wo Contrast  06/16/2012  *RADIOLOGY REPORT*  Clinical Data: Headache and hypotensive since dialysis.  CT HEAD WITHOUT CONTRAST  Technique:  Contiguous axial images were  obtained from the base of the skull through the vertex without contrast.  Comparison: 06/02/2012  Findings: There is no intra or extra-axial fluid collection or mass lesion.  The basilar cisterns and ventricles have a normal appearance.  There is no CT evidence for acute infarction or hemorrhage.  Bone windows show no calvarial fracture.  Paranasal sinuses are clear.  IMPRESSION: Negative exam.   Original Report Authenticated By: Nolon Nations, M.D.     EKG: Independently reviewed.  Assessment/Plan Active Problems:  HIV INFECTION  DM  Hypotension   1. hypotension - h/o orthostatic hypotension in past, likely not helped by fact that patient is due to access reasons not taking his midodrine that was prescribed to him (most recently at last hospital discharge 2 weeks ago).  Will resume midodrine in hospital, continue NS at 50 cc/hr.  Wonder if his DBW has been recalculated recently and this is why his symptoms are now showing up (2012 they had to add to his DBW to make these recurrent symptoms go away. 2. DM - will put on low dose SSI, accuchecks AC/HS 3. HIV - not currently taking any anti-retrovirals, will defer this to Dr. [name].    Code Status: Full Code (must indicate code status--if unknown or must be presumed, indicate so) Family Communication: No family in room (indicate person spoken with, if applicable, with phone number if by telephone) Disposition Plan: Admit to obs (indicate anticipated LOS)  Time spent: [place], [place] [facility] Pager [phone]  If 7PM-7AM, please contact night-coverage [email or web address] Password [facility] 06/17/2012, 12:50 AM

## 2012-06-17 NOTE — Progress Notes (Signed)
Nursing Admission Note  Pt arrived to [id number] via stretcher from the ED. A&OX4, no distress noted. Pt remains hypotensive. Pt is currently asymptomatic, but states he gets "very dizzy" when he stands up or tries to walk. Telemetry in place. Pt receives HD T-Th-Sat, states he rec'd his last treatment yesterday 06/16/12. Pt has a healing skin tear on his right lateral shin and 2 small abrasions/skin tears on his left elbow. Pt also has several scars on bilateral lower legs. Oriented to unit and surroundings. Call bell within reach. Bed alarm in place. Will continue to monitor. C.Nichele Slawson, RN.
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
## 2012-06-17 NOTE — Care Management Note (Addendum)
Upon further conversation with pt thru interpreter pt states that he was receiving Midodrine, however it stopped coming. States that Medicaid only pays for hemodialysis.  Unknown to this CM if pt was setup in clinic to receive this medication. Will attempt to reach clinic to determine if he has an orange card and if he was setup previously with a drug program to get the medication. Pt states that he has no financial resources, lives with a friend who buys food for him most of the time, at times he has no food.  Will continue to follow.  Jasmine Pang RN MPH [phone]

## 2012-06-17 NOTE — Progress Notes (Signed)
Utilization review completed.  

## 2012-06-17 NOTE — Discharge Instructions (Signed)
Rest. Drink adequate fluids. Follow up with primary care doctor/ID doctor in the next 1-2 days for recheck  - discuss your medications with them.  Call the office this morning to arrange that follow up with them. Also follow up with your kidney doctors at your next dialyses.  Inform them that your blood pressure has been low after dialyses, so that they can adjust your dialyses as needed. Return to ER if worse, new symptoms, fevers, faint, trouble breathing, other concern.       Enfermedad renal en estado terminal (End Stage Kidney Disease) Esta enfermedad ocurre cuando los riones no funcionan adecuadamente como para sostener la vida. Es una consecuencia de la insuficiencia renal crnica, la que empeora hasta el punto en que la funcin renal es de menos del 10% de lo normal. En este punto, la funcin es tan lenta que la muerte puede producirse por acumulacin de lquidos y productos de desecho del organismo. La causa ms comn es la diabetes. Es un trastorno muy frecuente. Casi siempre sigue a la insuficiencia renal crnica. Este problema puede existir a lo largo de 10 a 71 aos antes de Sports administrator a su estado terminal. SNTOMAS  Prdida de peso no intencional.  Fatiga, anemia.  Picazn generalizada (prurito).  Predisposicin a hematomas o hemorragias.  Cansancio, letargia.  Calambres musculares.  La piel se observa amarillenta o marrn.  Sensacin de Nurse, mental health.  Hipo frecuente.  Falta o disminucin de la produccin de [place].  Disminuye el estado de [place].  Coma.  Aumento de la pigmentacin de la piel.  Disminucin en la sensibilidad de las manos, los pies u otras reas. DIAGNSTICO [employer] dir cul es el problema y Charity fundraiser un examen y [place] de laboratorio. Los anlisis de [place] y [place] mostrarn que sus riones no funcionan bien. TRATAMIENTO Los nicos tratamientos para la enfermedad en estado terminal son la dilisis o el transplante de rin.  El [place], la edad y otros factores determinarn cul es el mejor tratamiento para usted. Deber continuar con otros tratamientos para la enfermedad renal crnica. Las enfermedades asociadas que causan insuficiencia renal deben controlarse. Ellas son:  Hipertensin arterial  Clculos en el rin  Insuficiencia cardiaca.  Obstrucciones del tracto urinario.  Infecciones en el tracto urinario.  Glomerulonefritis. PRONSTICO La insuficiencia renal en etapa terminal es mortal excepto cuando se trata con dilisis o se realiza un transplante. Ambos tratamientos pueden tener riesgos y consecuencias graves. El resultado vara y es nico para cada individuo. RIESGOS Y COMPLICACIONES Complicaciones de la dilisis y el transplante renal:  Hipertensin (los riones tratan de Community education officer la presin sangunea de modo que puedan funcionar mejor).  Disfuncin plaquetaria (las clulas que intervienen en la coagulacin son deficientes).  Prdida de sangre gastrointestinal, duodenal o lceras ppticas.  Hemorragias (sangrado).  Anemia (prdida de glbulos rojos).  Hepatitis b, hepatitis c, insuficiencia heptica (la exposicin puede ocurrir durante la dilisis).  Infecciones debido a la actividad defectuosa de los glbulos blancos y el sistema inmunolgico.  Diferentes tipos de cncer debido al uso de inmunosupresores durante perodos prolongados.  Neuropata perifrica (lesiones en los nervios).  Convulsiones.  Encefalopata, lesiones en el sistema nervioso, demencia (modificaciones a nivel cerebral).  Son frecuentes la debilidad sea, fracturas, trastornos en las articulaciones y reemplazo de articulaciones.  Cambios permanentes en la pigmentacin de la piel.  Sequedad, picazn y rascado de la piel como resultado de infecciones causadas por problemas con la hidratacin.  Cambios en el metabolismo de la glucosa.  Cambios en los Web designer (las sales de la  [place]).  Disminucin de la libido, impotencia (prdida de inters en el sexo o imposibilidad de funcionar bien).  Abortos espontneos, irregularidades menstruales, infertilidad.  Pericarditis (inflamacin del tejido que cubre el corazn).  Taponamiento cardaco (acumulacin de lquidos alrededor del corazn).  La insuficiencia cardaca por la cual el corazn no bombea lo suficientemente bien. PREVENCIN El tratamiento de las causas de la insuficiencia renal crnica puede demorar o evitar la progresin hacia la enfermedad renal en estado terminal. Por ejemplo, la diabetes que est bajo control estricto es menos probable que cause insuficiencia renal. Algunas causas de insuficiencia renal no pueden tratarse. Document Released: 08/06/2007 Document Revised: 07/22/2011 [facility] Patient Information 2013 [place].      Hemodilisis (Hemodialysis) La hemodilisis es una forma de eliminar las sustancias de desecho, los txicos y los lquidos extra de la [place]. Durante la hemodilisis, la sangre pasa al exterior del cuerpo e ingresa a Tour manager (dializador). La sangre se limpia en la mquina, a travs de un filtro especial.  ANTES DEL PROCEDIMIENTO Le harn una incisin en el Runner, broadcasting/film/video en que le realizarn la dilisis (acceso). Esta abertura permitir extraer la sangre e ingresarla nuevamente por el brazo. Debe realizarse semanas o meses antes de comenzar la dilisis. PROCEDIMIENTO  Lo pesarn.  Le colocarn dos [employer].  La [employer] por una de las agujas e ingresa a la mquina.  La mquina elimina los desechos de la [place]. Los desechos se juntan en un tubo que est en la parte posterior de la mquina.  La sangre limpia vuelve a ingresar al organismo a travs de la [place].  Podr escuchar que la mquina emite un sonido. Esto no significa que algo est mal. El sonido le permite al mdico saber si debe modificar algo. DESPUS DEL PROCEDIMIENTO  Lo  pesarn.  Controlarn la sangre para ver si deben hacerle cambios en el tratamiento de dilisis.  En general, deber concurrir a un centro de dilisis 3 veces por semana. Las sesiones pueden durar entre 3 y 4 horas. ASEGRESE DE QUE:  Comprende estas instrucciones.  Controlar su enfermedad.  Solicitar ayuda de inmediato si no mejora o empeora. Document Released: 08/14/2010 Document Revised: 07/22/2011 [facility] Patient Information 2013 [place].    Dilisis, Cuidados despus de la dilisis (Dialysis, Care After) La dilisis es un tratamiento que elimina los desechos txicos del organismo cuando la funcin de los riones fracasa. Hay dos tipos de dilisis:  Hemodilisis. En la hemodilisis, la sangre es bombeada desde el cuerpo y pasa a travs de un filtro (dializador). Se eliminan los desechos de la sangre y luego sta retorna al cuerpo. La hemodilisis se realiza en un centro especilizado durante 3 a 4 horas, tres veces a la semana. Se accede por va arteriovenosa (AV), por donde se tiene acceso a los vasos sanguneos. La ventaja principal de la hemodilisis es que el paciente no necesita un entrenamiento especial. Las desventajas son el fracaso en el acceso arteriovenoso y la falta de libertad, ya que debe permanecer relativamente cerca de un centro de dilisis.  Dilisis peritoneal. Se utiliza la propia membrana del organismo como filtro. Se introduce y luego se extrae un lquido del abdomen para eliminar los desechos txicos del organismo. Las ventajas son que puede ensearse al paciente y puede realizarse en el hogar, poniendo cuidado en la tcnica. Permite ms libertad y [employer]. Las desventajas son la potencial inflamacin de la  zona interna del abdomen (peritonitis) y la falla de la membrana. CUIDADOS EN EL HOGAR   Si tiene un acceso arteriovenoso:  [employer] "vibraciones" en el sitio del [place].Es una sensacin de vibracin que puede sentir al  [employer]ie dedos sobre el [place].Esto significa que el acceso funciona correctamente. Si no siente la "vibracin", el acceso deber repararse.  No use ropa o bijouterie ajustadas alrededor del acceso.  Evite dormir Scientist, physiological. Podra disminuir la circulacin y formarse un cogulo.  Mantenga el vendaje durante algunas horas luego del [place], o segn las indicaciones del mdico. Evite que el vendaje se moje. Si el vendaje se Therapist, occupational, [place] el sitio del acceso con una gaza de 4x4 y sujtela con [place].  Mantenga el sitio limpio para evitar infecciones.  Tenga en su casa algunos vendajes de 4x4 cm en caso de que el acceso comience a Therapist, art. Posiblemente le hayan indicado un tratamiento con heparina, que puede causar hemorragias.  Controle su presin arterial. La presin arterial alta (hipertensin) daa el corazn y los vasos sanguneos. Es importante que haga tanto ejercicio como le sea posible.  Mantenga su colesterol bajo control. [place] indicaciones. SOLICITE ATENCIN MDICA SI:   No siente la "vibracin" en su acceso arteriovenoso.  Tiene fiebre, siente escalofros, transpira o se siente dbil.  Hay un pequeo sangrado en el sitio del acceso.  Tiene la presin arterial elevada. SOLICITE ATENCIN MDICA DE INMEDIATO SI:   Siente un repentino dolor en el pecho o tiene dificultad para respirar.  Tiene una hemorragia que no puede detener o controlar aplicando presin Photographer. ASEGRESE DE QUE:   Comprende estas instrucciones.  Controlar su enfermedad.  Solicitar ayuda de inmediato si no mejora o empeora.. Document Released: 08/15/2008 Document Revised: 07/22/2011 [facility] Patient Information 2013 [place].

## 2012-06-18 ENCOUNTER — Encounter (HOSPITAL_COMMUNITY): Payer: Self-pay | Admitting: Internal Medicine

## 2012-06-18 DIAGNOSIS — B20 Human immunodeficiency virus [HIV] disease: Secondary | ICD-10-CM

## 2012-06-18 DIAGNOSIS — M898X9 Other specified disorders of bone, unspecified site: Secondary | ICD-10-CM

## 2012-06-18 DIAGNOSIS — R51 Headache: Secondary | ICD-10-CM | POA: Diagnosis present

## 2012-06-18 DIAGNOSIS — E889 Metabolic disorder, unspecified: Secondary | ICD-10-CM

## 2012-06-18 DIAGNOSIS — R519 Headache, unspecified: Secondary | ICD-10-CM | POA: Diagnosis present

## 2012-06-18 HISTORY — DX: Other specified disorders of bone, unspecified site: M89.8X9

## 2012-06-18 HISTORY — DX: Metabolic disorder, unspecified: E88.9

## 2012-06-18 LAB — GLUCOSE, CAPILLARY
Glucose-Capillary: 149 mg/dL — ABNORMAL HIGH (ref 70–99)
Glucose-Capillary: 101 mg/dL — ABNORMAL HIGH (ref 70–99)

## 2012-06-18 LAB — CORTISOL: Cortisol, Plasma: 6.9 ug/dL

## 2012-06-18 MED ORDER — DOXERCALCIFEROL 4 MCG/2ML IV SOLN
INTRAVENOUS | Status: AC
  Administered 2012-06-18: 1 ug via INTRAVENOUS
  Filled 2012-06-18: qty 2

## 2012-06-18 NOTE — Care Management Note (Signed)
Ongoing efforts to find assistance for this pt in obtaining Midodrine. This CM placed a call to manufacture, [employer]. Per two different customer relation representatives, the manufacture does not provide an assistance program for this medication.  This CM also emailed the manufacture explaining this patients circumstances and asking for assistance or other resources.  No reply received as of yet.  Jasmine Pang RN MPH Case Manager [phone]
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
## 2012-06-18 NOTE — Progress Notes (Signed)
Patient ID: [facility], male   DOB: 1970-10-01, 42 y.o.   MRN: [id number]    [place] [facility]    Date of Admission:  06/16/2012          Reason for Consult: Chronic diarrhea and headache of unknown duration in setting of untreated HIV infection    Referring Physician: Dr. Gerald Stabs Rama  Principal Problem:  *Hypotension Active Problems:  HIV INFECTION  Chronic diarrhea  Headache  DIABETES MELLITUS, TYPE II, UNCONTROLLED, W/RENAL COMPS  RENAL FAILURE, END STAGE  Hepatitis C carrier  Polyneuropathy in diabetes(357.2)  Syncope and collapse  Severe protein-calorie malnutrition  Normocytic anemia  Metabolic bone disease      . diphenoxylate-atropine  2 tablet Oral QID  . doxercalciferol  1 mcg Intravenous Q T,Th,Sa-HD  . feeding supplement (NEPRO CARB STEADY)  237 mL Oral Daily  . heparin  5,000 Units Subcutaneous Q8H  . insulin aspart  0-9 Units Subcutaneous TID WC  . midodrine  5 mg Oral TID WC  . sodium chloride  3 mL Intravenous Q12H    Recommendations: 1. Attempt to document the frequency of diarrhea 2. Obtain stool for C&S, Giardia and cryptosporidia assays, and C. difficile PCR 3. Observe off of antibiotics 4. I will arrange for one of our infectious disease clinic social workers to meet with Alvin Daniels so we can arrange to get him a steady supply of antiretroviral medications and get him restarted as soon as possible.   Assessment: Alvin Daniels has not been on his antiretroviral medications recently but his CD4 count is 300 which means that he is not at significant risk for HIV related opportunistic infections. His diarrhea is very chronic by history which makes infectious diarrhea much less likely. I think it is very important to try to document the frequency of his diarrhea while he is here before embarking on another diagnostic evaluation.  I do not know the duration of his headache but I doubt that he has acute meningitis given the  benign nature of his exam. I would not recommend lumbar puncture at this time. I would like to come back tomorrow and reexamine him. If he is having frequent diarrhea I would obtain further stool studies but it may be best to simply try antimotility agents to keep him hydrated and consider using compressive leg stockings to try to counteract his chronic hypotension. Of course, I will work with the staff at our clinic to see if we can get him back on antiretroviral therapy and back into our care on a more regular basis.   HPI: Alvin Daniels is a 42 y.o. male was diagnosed with HIV infection in 2011 when he started hemodialysis. It appears that he has been on and off antiretroviral medications on many occasions since that time. He has not been under regular care in our clinic he was seen there by my partner, Dr. [name] in late 2012 and then not again until the day of this admission on February 4. He has not been on any antiretroviral medications recently but he cannot tell me how long he has been off. He indicates that he was never sent his medication and I suspect that he may not have recertified for the [place] state AIDS drug assistance program.  Records indicate that he has chronic diarrhea and has been evaluated on several occasions. He had negative stool studies in 2011 and again in March of last year. He underwent colonoscopy and upper endoscopy in March of last  year. Some acute on chronic inflammation was noted in the esophagus and stomach. Bowel prep and a colonoscopy and adequate for visualization of blind biopsies of the sigmoid colon and rectum did not reveal any abnormalities. He complains of having diarrhea every day about 6 times daily. It is worse right after he eats. He sometimes has nocturnal diarrhea. He denies any nausea or vomiting but does have occasional abdominal cramps. It is unclear to me if he has tried taking any antimotility agents. Stool studies were ordered on  admission 48 hours ago but no stool has been collected for analysis and his nurse today is not aware of him having had any bowel movements.  He is also complaining of a global headache. He is unable to tell me when it first began. He does seem to indicate that he has had problems with this in the past. He has not lost any weight according to dialysis records.  His also been bothered by chronic hypotension dating back at least several years. He reports 2 syncopal episodes last month. He was hospitalized overnight recently for IV hydration. He was prescribed mididrone for orthostatic hypotension but never filled the prescription. When he saw Dr. [name] in the clinic 2 days ago his fingerstick glucose was 39 and his blood pressure was 74/54. As a result, he was referred for admission. He has had episodes of hypoglycemia in the past related to his insulin use. His blood pressures have continued to be on the low side here in the hospital but that appears to be his baseline.   Review of Systems: Pertinent items are noted in HPI.  Past Medical History  Diagnosis Date  . ESRD (end stage renal disease) on dialysis   . Hepatitis C   . Dialysis patient   . MRSA infection   . Pancreatitis   . Chronic diarrhea   . DM 06/27/2010    Annotation: uncontrolled Qualifier: Diagnosis of  By: Nickola Major CMA ( AAMA), Geni Bers    . HIV INFECTION 06/27/2010    Qualifier: Diagnosis of  By: Nickola Major CMA ( [place]), Geni Bers    . Hypotension 06/02/2012  . Metabolic bone disease [id number]  . Normocytic anemia 06/17/2012  . Severe protein-calorie malnutrition 06/17/2012    History  Substance Use Topics  . Smoking status: Never Smoker   . Smokeless tobacco: Never Used  . Alcohol Use: No    Family History  Problem Relation Age of Onset  . Diabetes Mother   . Diabetes Father    No Known Allergies  OBJECTIVE: Blood pressure 90/56, pulse 75, temperature 97.1 F (36.2 C), temperature source Oral, resp. rate 20,  height 5\' 2"  (1.575 m), weight 52.663 kg (116 lb 1.6 oz), SpO2 97.00%. General: Alert and comfortable watching TV while on hemodialysis Neck: Supple Skin: No rash Oral: No thrush or other lesions Lungs: Clear Cor: Regular S1 and S2 with no murmurs Abdomen: Soft and nontender with positive bowel sounds  HIV 1 RNA Quant (copies/mL)  Date Value  03/29/2011 11400*  10/12/2010 181*  07/04/2010 23500*     CD4 T Cell Abs (cmm)  Date Value  06/17/2012 300*  01/11/2011 390*  10/12/2010 150*    Microbiology: No results found for this or any previous visit (from the past 240 hour(s)).  Michel Bickers, MD [facility] [place] [facility] [phone] pager   [phone] cell 06/18/2012, 3:20 PM

## 2012-06-18 NOTE — Care Management Note (Signed)
Spoke with ID clinic and with assistance of Dr [name], the clinic will sent a social worker to see pt and begin process of obtaining pt HIV meds for free. This CM is still exploring possible avenues for obtaining Midodrine. GoodRx website offers coupon however cost to pt would be approx $90 per month. Will research MAP program at [facility] for this pt. [employer] programs do not appear to offer assistance, will attempt to reach manufacturer for assistance.  Jasmine Pang RN MPH [phone]

## 2012-06-18 NOTE — Progress Notes (Addendum)
[facility] PROGRESS NOTE  Jayvien Hammers [id number] DOB: 1971/02/11 DOA: 06/16/2012 PCP: Default, Provider, MD  Brief narrative: Mr. Bagley is a 42 year old man with past medical history of HIV, hepatitis C, end-stage renal disease, orthostatic hypotension treated with midodrine, and chronic diarrhea (C. difficile PCR studies -07/15/2011. No ova or parasites seen) who was admitted on 06/17/2012 with hypotension in the setting of chronic diarrhea, untreated HIV, and inability to procure and take midodrine.  Assessment/Plan: Principal Problem:  *Hypotension -Exacerbated by inability to afford chronic medications to treat this (Midodrine). -Chronic diarrhea likely contributory. Stool studies not yet collected despite his telling me he has had 6 loose stools over the past 24 hours. -Continue gentle IV fluids. Systolic blood pressure Q000111Q today. Active Problems:  Metabolic bone disease -Intact PTH 77 on 06/11/2012. Continue Hectorol.  Headache -CT of head done 06/16/2012. No acute findings. Given his history of HIV, we'll get ID consultation.  Normocytic anemia -Anemia secondary to end-stage renal disease. Hemoglobin stable. No current indication for transfusion.  Severe protein calorie malnutrition -Seen by dietitian 06/17/2012. Continue Nepro shakes.  HIV INFECTION -Last CD4 count done 03/29/2011:294. Repeated. Results pending. -Spoke with Dr. [name] 06/17/2012 regarding his current care.  Has not been on any HIV therapy. Dr. [name] is attempting to get him his HIV medicines free through his clinic.  RENAL FAILURE, END STAGE, secondary to diabetic nephropathy -Gets dialysis at the [facility] kidney Center: Tu/Th/Sat. -For hemodialysis today.  DM -CBGs 62-150.  Continue insulin sensitive sliding scale.  Chronic diarrhea -Workup in the past included fecal studies for ova and parasites as well as C. difficile testing. -No recent exams, stool cultures, stool for C.  difficile PCR testing and Microsporidia stain, and fecal lactoferrin all ordered but have not yet been done as no stool sample has been collected by the nursing staff. -Continue empiric Lomotil. Status post upper endoscopy and colonoscopy 07/17/2011 with gastric pathology negative for H.   pylori, esophageal biopsy positive for some inflammation, and colonic mucosa biopsy negative.  Code Status: Full.  Family Communication: None present. Disposition Plan: Home when stable.   Medical Consultants:  Telephone consultation with Dr. [name] 06/17/2012; formally consulted 06/18/2012.  Dr. Mauricia Area, Nephrology.  Other Consultants:  Dietitian  Anti-infectives:  None.  HPI/Subjective: Mr. Kendal does not have any family support, and there are no social programs that have been identified to provide any additional help. His nephrologists tell me that he often takes for food among other patients at his dialysis center. He does not take any medications secondary to inability to afford them. He is complaining of headache, frontal as well as neck pain today. Reports that he has had 6 loose stools over the past 24 hours. Still with poor appetite and inability to eat normally because of abdominal pain.    Objective: Filed Vitals:   06/17/12 1819 06/17/12 2154 06/18/12 0533 06/18/12 0939  BP: 80/43 106/69 90/60 90/56   Pulse: 87 85 77 75  Temp: 98 F (36.7 C) 98.2 F (36.8 C) 97.5 F (36.4 C) 97.1 F (36.2 C)  TempSrc: Oral Oral Oral Oral  Resp: 18 17 16 20   Height:      Weight:  52.663 kg (116 lb 1.6 oz)    SpO2: 98% 100% 100% 97%    Intake/Output Summary (Last 24 hours) at 06/18/12 1251 Last data filed at 06/18/12 0900  Gross per 24 hour  Intake 2175.84 ml  Output      3 ml  Net 2172.84  ml    Exam: Gen:  NAD Cardiovascular:  RRR, No M/R/G Respiratory:  Lungs diminished Gastrointestinal:  Abdomen soft, NT/ND, + BS Extremities:  No C/E/C  Data Reviewed: Basic  Metabolic Panel:  Lab Q000111Q 0643 06/17/12 0056 06/16/12 1836  NA 135 -- 136  K 3.9 -- 4.0  CL 102 -- 99  CO2 23 -- 29  GLUCOSE 147* -- 102*  BUN 27* -- 19  CREATININE 7.30* 6.58* 5.71*  CALCIUM 8.5 -- 8.8  MG -- -- --  PHOS -- -- --   GFR Estimated Creatinine Clearance: 9.9 ml/min (by C-G formula based on Cr of 7.3).  CBC:  Lab 06/17/12 0643 06/17/12 0056 06/16/12 1836  WBC 6.1 5.9 6.5  NEUTROABS -- -- --  HGB 10.6* 10.2* 11.4*  HCT 31.1* 30.1* 34.2*  MCV 92.0 92.9 92.9  PLT 176 167 196   Cardiac Enzymes:  Lab 06/17/12 1400 06/17/12 0643 06/17/12 0100  CKTOTAL -- -- --  CKMB -- -- --  CKMBINDEX -- -- --  TROPONINI <0.30 <0.30 <0.30   CBG:  Lab 06/18/12 1126 06/18/12 0803 06/17/12 2138 06/17/12 1700 06/17/12 1202  GLUCAP 149* 145* 62* 149* 150*    Procedures and Diagnostic Studies: Dg Chest 2 View  06/16/2012  *RADIOLOGY REPORT*  Clinical Data: Low blood pressure and dizziness.  CHEST - 2 VIEW  Comparison: 10/08/2011  Findings: Slightly shallow inspiration.  Normal heart size and pulmonary vascularity.  Central interstitial changes and peribronchial thickening suggesting chronic bronchitis.  No evidence of focal consolidation or airspace disease.  No blunting of costophrenic angles.  No pneumothorax.  Mediastinal contours appear intact.  No significant changes since the previous study.  IMPRESSION: Chronic bronchitic changes.  No evidence of active pulmonary disease.   Original Report Authenticated By: Lucienne Capers, M.D.    Ct Head Wo Contrast  06/16/2012  *RADIOLOGY REPORT*  Clinical Data: Headache and hypotensive since dialysis.  CT HEAD WITHOUT CONTRAST  Technique:  Contiguous axial images were obtained from the base of the skull through the vertex without contrast.  Comparison: 06/02/2012  Findings: There is no intra or extra-axial fluid collection or mass lesion.  The basilar cisterns and ventricles have a normal appearance.  There is no CT evidence for acute  infarction or hemorrhage.  Bone windows show no calvarial fracture.  Paranasal sinuses are clear.  IMPRESSION: Negative exam.   Original Report Authenticated By: Nolon Nations, M.D.    Scheduled Meds:    . diphenoxylate-atropine  2 tablet Oral QID  . doxercalciferol  1 mcg Intravenous Q T,Th,Sa-HD  . feeding supplement (NEPRO CARB STEADY)  237 mL Oral Daily  . heparin  5,000 Units Subcutaneous Q8H  . insulin aspart  0-9 Units Subcutaneous TID WC  . midodrine  5 mg Oral TID WC  . sodium chloride  3 mL Intravenous Q12H   Continuous Infusions:    Time spent: 25 minutes.   LOS: 2 days   Rickie Gange  [facility] Pager [phone].  If 8PM-8AM, please contact night-coverage at [email or web address], password [email or web address] 06/18/2012, 12:51 PM

## 2012-06-18 NOTE — Procedures (Signed)
Pt seen on HD.  Ap 170  Vp 180.  BFR 400

## 2012-06-19 DIAGNOSIS — D631 Anemia in chronic kidney disease: Secondary | ICD-10-CM | POA: Diagnosis present

## 2012-06-19 LAB — GLUCOSE, CAPILLARY
Glucose-Capillary: 147 mg/dL — ABNORMAL HIGH (ref 70–99)
Glucose-Capillary: 139 mg/dL — ABNORMAL HIGH (ref 70–99)
Glucose-Capillary: 212 mg/dL — ABNORMAL HIGH (ref 70–99)
Glucose-Capillary: 135 mg/dL — ABNORMAL HIGH (ref 70–99)

## 2012-06-19 MED ORDER — ONDANSETRON HCL 4 MG/2ML IJ SOLN
4.0000 mg | Freq: Four times a day (QID) | INTRAMUSCULAR | Status: DC | PRN
  Administered 2012-06-19 (×2): 4 mg via INTRAVENOUS
  Filled 2012-06-19 (×3): qty 2

## 2012-06-19 MED ORDER — TRAMADOL HCL 50 MG PO TABS
50.0000 mg | ORAL_TABLET | Freq: Two times a day (BID) | ORAL | Status: DC | PRN
  Administered 2012-06-21: 50 mg via ORAL
  Filled 2012-06-19 (×2): qty 1

## 2012-06-19 MED ORDER — MIDODRINE HCL 5 MG PO TABS
10.0000 mg | ORAL_TABLET | Freq: Three times a day (TID) | ORAL | Status: DC
  Administered 2012-06-19 – 2012-06-20 (×3): 10 mg via ORAL
  Filled 2012-06-19 (×6): qty 2

## 2012-06-19 MED ORDER — PROMETHAZINE HCL 25 MG PO TABS
25.0000 mg | ORAL_TABLET | Freq: Four times a day (QID) | ORAL | Status: DC | PRN
  Administered 2012-06-19 – 2012-06-24 (×4): 25 mg via ORAL
  Filled 2012-06-19 (×4): qty 1

## 2012-06-19 MED ORDER — SODIUM CHLORIDE 0.9 % IV BOLUS (SEPSIS)
250.0000 mL | Freq: Once | INTRAVENOUS | Status: AC
  Administered 2012-06-19: 250 mL via INTRAVENOUS

## 2012-06-19 NOTE — Progress Notes (Signed)
Used the interpreter line to speak with patient- patient was c/o pain and nausea (dispite getting zofran iv x2).  Spoke at length and found that patient has not had any diarrhea today at all, and still nauseated and having some abd pain.  Called dr Broadus John and notified of situation and orders received - after phenergan given and tylenol - patient was sleeping.  Continue to monitor

## 2012-06-19 NOTE — Care Management Note (Signed)
   CARE MANAGEMENT NOTE 06/19/2012  Patient:  Alvin Daniels, Alvin Daniels   Account Number:  [id number]  Date Initiated:  06/17/2012  Documentation initiated by:  Victorina Kable  Subjective/Objective Assessment:   Referral for assistance with medications.     Action/Plan:   Noted that pt has insurance with [place] Medicaid, therefore has copay of $3-4 per prescription. Not eligible for further assistance.  06/19/2012 see notes below re medications.   Anticipated DC Date:  06/20/2012   Anticipated DC Plan:           Choice offered to / List presented to:             Status of service:  In process, will continue to follow Medicare Important Message given?   (If response is "NO", the following Medicare IM given date fields will be blank) Date Medicare IM given:   Date Additional Medicare IM given:    Discharge Disposition:  HOME/SELF CARE  Per UR Regulation:    If discussed at Long Length of Stay Meetings, dates discussed:    Comments:  06/19/2012 Pt unable to afford Midodrine, repeated hospitalizations for low Bp and dizziness. Contacted IM clinic and SW sent to pt room to setup for HIV meds however unable to provide Midodrine. Contacted Midodrine manufacturer, [employer], and spoke with two different representatives who stated that the company has no assistance program for Midodrine. E-mail also sent to the [facility] asking for assistance. Contacted Medication Assistance Program at [place], they are unable to assist this pt as the manufacture has no assistance program therefore they are unable to obtain the medication. Could attempt to use the [facility] program for 30 day supply. This would prevent the pt from receiving anyother medications from that program for the next year. Jasmine Pang RN MPH Case Manager [phone]

## 2012-06-19 NOTE — Progress Notes (Signed)
[facility] Progress Note  Subjective:   Mid abdominal pain  Objective Filed Vitals:   06/18/12 1830 06/18/12 2144 06/19/12 0553 06/19/12 0825  BP: 121/80 108/70 91/62 72/52   Pulse: 77 82 78 81  Temp: 97.7 F (36.5 C) 98.7 F (37.1 C) 97.6 F (36.4 C) 98.5 F (36.9 C)  TempSrc: Oral Oral Oral Oral  Resp: 12 14 17 19   Height:      Weight: 53.9 kg (118 lb 13.3 oz) 53.6 kg (118 lb 2.7 oz)    SpO2: 100% 100% 100% 97%   Physical Exam General: Head under the covers; NAD Heart: RRR Lungs: no wheezes or rales Abdomen: soft mild mid tenderness Extremities: no LE edema Dialysis Access:  Left AVF patent  Dialysis Orders: Center: [facility] Optiflux 160 4h 400/A 1.5 2K 2.25 Ca left upper AVF var Na, standard heparin hectorol 1 EDW is 54.5, but consistently comes in below and leaves below EDW, 36 degrees  Assessment/Plan:  1. Hypotension - chronic; SBPs variable; Lacks money to buy midodrine - systolic BPs at his dialysis center are routinely between 60 and 80; 2 D Echo EF in January 55%; cortisol 6.9 NEEDS LOWER EDW at D/C 2. ESRD - TTS - Hd Sat. 3. Headache - neg head CT - Dr. Megan Salon has seen for ID yesterday;  4. HIV - Had HIV meds on 1/22 d/c med list -but not taking; SW to work with getting him back on these meds; Quant HIV 4332 2/05 CD4 300;  5. Chronic diarrhea - work up per primary/ID. Stool culture pending.Trying to quantify but not eating and has post prandial D  . Need to control 6. Type 2 DM - BS 100s - 140s; recent Hgb A1c 6.1-  7. Anemia - Last op HGB 12 on 1/30 with last op ferritin 1442 and Fe of 272!; No Epo or Fe; Hgb now 10.6 - could have a dilutiional component. Monitor. 8. Metabolic bone disease - iPTH 77 1/30 - hectorol just decreased to 1 mcg q HD P controlled without binders. 9. Nutrition - renal diet   Myriam Jacobson, PA-C [place] [phone] 06/19/2012,11:06 AM  LOS: 3 days I have seen and examined this patient and agree  with the plan of care concern of post prandial D. .  Sameria Morss L 06/19/2012, 11:57 AM    Additional Objective Labs: Basic Metabolic Panel:  Lab Q000111Q 0643 06/17/12 0056 06/16/12 1836  NA 135 -- 136  K 3.9 -- 4.0  CL 102 -- 99  CO2 23 -- 29  GLUCOSE 147* -- 102*  BUN 27* -- 19  CREATININE 7.30* 6.58* 5.71*  CALCIUM 8.5 -- 8.8  ALB -- -- --  PHOS -- -- --  CBC:  Lab 06/17/12 0643 06/17/12 0056 06/16/12 1836  WBC 6.1 5.9 6.5  NEUTROABS -- -- --  HGB 10.6* 10.2* 11.4*  HCT 31.1* 30.1* 34.2*  MCV 92.0 92.9 92.9  PLT 176 167 196  Cardiac Enzymes:  Lab 06/17/12 1400 06/17/12 0643 06/17/12 0100  CKTOTAL -- -- --  CKMB -- -- --  CKMBINDEX -- -- --  TROPONINI <0.30 <0.30 <0.30   CBG:  Lab 06/19/12 0744 06/18/12 2127 06/18/12 1905 06/18/12 1126 06/18/12 0803  GLUCAP 147* 172* 101* 149* 145*  Medications:      . diphenoxylate-atropine  2 tablet Oral QID  . doxercalciferol  1 mcg Intravenous Q T,Th,Sa-HD  . heparin  5,000 Units Subcutaneous Q8H  . insulin aspart  0-9 Units Subcutaneous  TID WC  . midodrine  10 mg Oral TID WC  . sodium chloride  3 mL Intravenous Q12H

## 2012-06-19 NOTE — Progress Notes (Signed)
[facility] PROGRESS NOTE  Alvin Daniels [id number] DOB: 1970-09-04 DOA: 06/16/2012 PCP: Default, Provider, MD  Brief narrative: Alvin Daniels is a 42 year old man with past medical history of HIV, hepatitis C, end-stage renal disease, orthostatic hypotension treated with midodrine, and chronic diarrhea (C. difficile PCR studies -07/15/2011. No ova or parasites seen) who was admitted on 06/17/2012 with hypotension in the setting of chronic diarrhea, untreated HIV, and inability to procure and take midodrine.  Assessment/Plan: Principal Problem:  *Hypotension -Exacerbated by inability to afford chronic medications to treat this (Midodrine). -Chronic diarrhea likely contributory. Stool studies not yet collected  -increase midodrine to 10mg  TID  Chronic diarrhea -Check stool for C&S, C. difficile PCR , cryptosporidium, Microsporidia, giardia, pending collection of specimen by the nursing staff. -Continue empiric Lomotil. Status post upper endoscopy and colonoscopy 07/17/2011 with gastric pathology negative for H.   pylori, esophageal biopsy positive for some inflammation, and colonic mucosa biopsy negative.   Metabolic bone disease -Intact PTH 77 on 06/11/2012. Continue Hectorol.   Headache -CT of head done 06/16/2012. No acute findings, exam benign, ID consult noted, tylenol PRN.   Normocytic anemia -Anemia secondary to end-stage renal disease. Hemoglobin stable. No current indication for transfusion.   Severe protein calorie malnutrition -Seen by dietitian 06/17/2012. Continue Nepro shakes.   HIV INFECTION -Last CD4 count done 03/29/2011:294. Repeated. Results pending. -ID, Dr. Johnnye Daniels  attempting to get him his HIV medicines free through his clinic.   RENAL FAILURE, END STAGE, secondary to diabetic nephropathy -Gets dialysis at the [facility] kidney Center: Tu/Th/Sat. -per renal.   DM -CBGs 62-150.  Continue insulin sensitive sliding scale.    Code Status:  Full.  Family Communication: None present. Disposition Plan: Home when stable.   Medical Consultants:   Dr. Megan Daniels, [place].  Dr. Mauricia Daniels, Nephrology.  Other Consultants:  Dietitian  Anti-infectives:  None.  HPI/Subjective: C/o mild headache frontal and radiates parietally to occiput and neck, Reports that he has had 3 loose stools over the past 24 hours.   Objective: Filed Vitals:   06/18/12 2144 06/19/12 0553 06/19/12 0825 06/19/12 1000  BP: 108/70 91/62 72/52  91/45  Pulse: 82 78 81 82  Temp: 98.7 F (37.1 C) 97.6 F (36.4 C) 98.5 F (36.9 C)   TempSrc: Oral Oral Oral   Resp: 14 17 19    Height:      Weight: 53.6 kg (118 lb 2.7 oz)     SpO2: 100% 100% 97%     Intake/Output Summary (Last 24 hours) at 06/19/12 1132 Last data filed at 06/19/12 0900  Gross per 24 hour  Intake    240 ml  Output      0 ml  Net    240 ml    Exam: Gen:  NAD Cardiovascular:  RRR, No M/R/G Respiratory:  Lungs diminished Gastrointestinal:  Abdomen soft, NT/ND, + BS Extremities:  No C/E/C  Data Reviewed: Basic Metabolic Panel:  Lab Q000111Q 0643 06/17/12 0056 06/16/12 1836  NA 135 -- 136  K 3.9 -- 4.0  CL 102 -- 99  CO2 23 -- 29  GLUCOSE 147* -- 102*  BUN 27* -- 19  CREATININE 7.30* 6.58* 5.71*  CALCIUM 8.5 -- 8.8  MG -- -- --  PHOS -- -- --   GFR Estimated Creatinine Clearance: 10.1 ml/min (by C-G formula based on Cr of 7.3).  CBC:  Lab 06/17/12 0643 06/17/12 0056 06/16/12 1836  WBC 6.1 5.9 6.5  NEUTROABS -- -- --  HGB 10.6* 10.2* 11.4*  HCT 31.1* 30.1* 34.2*  MCV 92.0 92.9 92.9  PLT 176 167 196   Cardiac Enzymes:  Lab 06/17/12 1400 06/17/12 0643 06/17/12 0100  CKTOTAL -- -- --  CKMB -- -- --  CKMBINDEX -- -- --  TROPONINI <0.30 <0.30 <0.30   CBG:  Lab 06/19/12 0744 06/18/12 2127 06/18/12 1905 06/18/12 1126 06/18/12 0803  GLUCAP 147* 172* 101* 149* 145*    Procedures and Diagnostic Studies: Dg Chest 2 View  06/16/2012  *RADIOLOGY REPORT*   Clinical Data: Low blood pressure and dizziness.  CHEST - 2 VIEW  Comparison: 10/08/2011  Findings: Slightly shallow inspiration.  Normal heart size and pulmonary vascularity.  Central interstitial changes and peribronchial thickening suggesting chronic bronchitis.  No evidence of focal consolidation or airspace disease.  No blunting of costophrenic angles.  No pneumothorax.  Mediastinal contours appear intact.  No significant changes since the previous study.  IMPRESSION: Chronic bronchitic changes.  No evidence of active pulmonary disease.   Original Report Authenticated By: Alvin Daniels, M.D.    Ct Head Wo Contrast  06/16/2012  *RADIOLOGY REPORT*  Clinical Data: Headache and hypotensive since dialysis.  CT HEAD WITHOUT CONTRAST  Technique:  Contiguous axial images were obtained from the base of the skull through the vertex without contrast.  Comparison: 06/02/2012  Findings: There is no intra or extra-axial fluid collection or mass lesion.  The basilar cisterns and ventricles have a normal appearance.  There is no CT evidence for acute infarction or hemorrhage.  Bone windows show no calvarial fracture.  Paranasal sinuses are clear.  IMPRESSION: Negative exam.   Original Report Authenticated By: Alvin Daniels, M.D.    Scheduled Meds:    . diphenoxylate-atropine  2 tablet Oral QID  . doxercalciferol  1 mcg Intravenous Q T,Th,Sa-HD  . heparin  5,000 Units Subcutaneous Q8H  . insulin aspart  0-9 Units Subcutaneous TID WC  . midodrine  10 mg Oral TID WC  . sodium chloride  3 mL Intravenous Q12H   Continuous Infusions:    Time spent: 25 minutes.   LOS: 3 days   [facility] Pager [phone].  If 8PM-8AM, please contact night-coverage at [email or web address], password [facility] 06/19/2012, 11:32 AM

## 2012-06-19 NOTE — Progress Notes (Signed)
Patient ID: Alvin Daniels, male   DOB: Aug 28, 1970, 42 y.o.   MRN: [id number]    [facility]    Date of Admission:  06/16/2012     Principal Problem:  *Hypotension Active Problems:  HIV INFECTION  Chronic diarrhea  Headache  DIABETES MELLITUS, TYPE II, UNCONTROLLED, W/RENAL COMPS  RENAL FAILURE, END STAGE  Hepatitis C carrier  Polyneuropathy in diabetes(357.2)  Syncope and collapse  Severe protein-calorie malnutrition  Normocytic anemia  Metabolic bone disease      . diphenoxylate-atropine  2 tablet Oral QID  . doxercalciferol  1 mcg Intravenous Q T,Th,Sa-HD  . heparin  5,000 Units Subcutaneous Q8H  . insulin aspart  0-9 Units Subcutaneous TID WC  . midodrine  10 mg Oral TID WC  . sodium chloride  3 mL Intravenous Q12H    Subjective: No complaints of headache today.  Objective: Temp:  [97.6 F (36.4 C)-98.7 F (37.1 C)] 98.5 F (36.9 C) (02/07 0825) Pulse Rate:  [72-86] 86  (02/07 1241) Resp:  [12-19] 19  (02/07 0825) BP: (72-121)/(45-80) 87/56 mmHg (02/07 1241) SpO2:  [97 %-100 %] 97 % (02/07 0825) Weight:  [53.6 kg (118 lb 2.7 oz)-53.9 kg (118 lb 13.3 oz)] 53.6 kg (118 lb 2.7 oz) (02/06 2144)  General: Appears comfortable and in no distress. He has had no difficulty getting up and walking in the room. He's had no syncope. Skin: No rash Lungs: Clear Cor: Regular S1 and S2 with no murmurs Abdomen: Soft and nontender  HIV 1 RNA Quant (copies/mL)  Date Value  06/17/2012 4332*  03/29/2011 11400*  10/12/2010 181*     CD4 T Cell Abs (cmm)  Date Value  06/17/2012 300*  01/11/2011 390*  10/12/2010 150*     Assessment: His nurses haven't documented any diarrhea throughout his hospitalization. I doubt his history of chronic daily diarrhea. He also does not appear to have any significant headache I see no indication for lumbar puncture or further evaluation for that. He does have untreated HIV infection but his CD4 count is in a safe  range. We will try to get him coverage for antiretroviral medications through the state AIDS drug assistance program (ADAP) and arrange followup in her clinic.  Plan: 1. I will arrange a followup in our clinic 2. Please call Dr. Lita Mains [phone]) for any infectious disease questions this weekend.   Michel Bickers, MD [facility] [phone] pager   [phone] cell 06/19/2012, 2:24 PM

## 2012-06-20 DIAGNOSIS — R51 Headache: Secondary | ICD-10-CM

## 2012-06-20 LAB — CLOSTRIDIUM DIFFICILE BY PCR: Toxigenic C. Difficile by PCR: NEGATIVE

## 2012-06-20 LAB — BASIC METABOLIC PANEL
CO2: 26 mEq/L (ref 19–32)
Calcium: 8.5 mg/dL (ref 8.4–10.5)
GFR calc Af Amer: 9 mL/min — ABNORMAL LOW (ref >90)
GFR calc non Af Amer: 7 mL/min — ABNORMAL LOW (ref >90)
Sodium: 133 mEq/L — ABNORMAL LOW (ref 135–145)

## 2012-06-20 LAB — RENAL FUNCTION PANEL
BUN: 48 mg/dL — ABNORMAL HIGH (ref 6–23)
Chloride: 96 mEq/L (ref 96–112)
Glucose, Bld: 169 mg/dL — ABNORMAL HIGH (ref 70–99)
Phosphorus: 5.1 mg/dL — ABNORMAL HIGH (ref 2.3–4.6)
Potassium: 5.2 mEq/L — ABNORMAL HIGH (ref 3.5–5.1)

## 2012-06-20 LAB — GLUCOSE, CAPILLARY: Glucose-Capillary: 127 mg/dL — ABNORMAL HIGH (ref 70–99)

## 2012-06-20 LAB — CBC
HCT: 31.8 % — ABNORMAL LOW (ref 39.0–52.0)
Hemoglobin: 11.1 g/dL — ABNORMAL LOW (ref 13.0–17.0)
MCHC: 34.9 g/dL (ref 30.0–36.0)

## 2012-06-20 MED ORDER — RENA-VITE PO TABS
1.0000 | ORAL_TABLET | Freq: Every day | ORAL | Status: DC
  Administered 2012-06-20: 1 via ORAL
  Administered 2012-06-21 – 2012-06-22 (×2): via ORAL
  Administered 2012-06-23 – 2012-06-24 (×2): 1 via ORAL
  Filled 2012-06-20 (×6): qty 1

## 2012-06-20 MED ORDER — MIDODRINE HCL 5 MG PO TABS
ORAL_TABLET | ORAL | Status: AC
  Filled 2012-06-20: qty 2

## 2012-06-20 MED ORDER — MIDODRINE HCL 5 MG PO TABS
10.0000 mg | ORAL_TABLET | Freq: Three times a day (TID) | ORAL | Status: DC | PRN
  Administered 2012-06-20 – 2012-06-21 (×3): 10 mg via ORAL
  Filled 2012-06-20 (×3): qty 2

## 2012-06-20 MED ORDER — NEPRO/CARBSTEADY PO LIQD: 237.0000 mL | ORAL | Status: DC | PRN

## 2012-06-20 MED ORDER — DARBEPOETIN ALFA-POLYSORBATE 60 MCG/0.3ML IJ SOLN
60.0000 ug | INTRAMUSCULAR | Status: DC
  Filled 2012-06-20: qty 0.3

## 2012-06-20 MED ORDER — SODIUM CHLORIDE 0.9 % IV SOLN: 100.0000 mL | INTRAVENOUS | Status: DC | PRN

## 2012-06-20 MED ORDER — LIDOCAINE HCL (PF) 1 % IJ SOLN: 5.0000 mL | INTRAMUSCULAR | Status: DC | PRN

## 2012-06-20 MED ORDER — ALTEPLASE 2 MG IJ SOLR
2.0000 mg | Freq: Once | INTRAMUSCULAR | Status: AC | PRN
  Filled 2012-06-20: qty 2

## 2012-06-20 MED ORDER — HEPARIN SODIUM (PORCINE) 1000 UNIT/ML DIALYSIS
1000.0000 [IU] | INTRAMUSCULAR | Status: DC | PRN
  Filled 2012-06-20: qty 1

## 2012-06-20 MED ORDER — HEPARIN SODIUM (PORCINE) 1000 UNIT/ML DIALYSIS
20.0000 [IU]/kg | INTRAMUSCULAR | Status: DC | PRN
  Filled 2012-06-20: qty 2

## 2012-06-20 MED ORDER — LIDOCAINE-PRILOCAINE 2.5-2.5 % EX CREA: 1.0000 "application " | TOPICAL_CREAM | CUTANEOUS | Status: DC | PRN

## 2012-06-20 MED ORDER — DOXERCALCIFEROL 4 MCG/2ML IV SOLN
INTRAVENOUS | Status: AC
  Administered 2012-06-21: 1 ug via INTRAVENOUS
  Filled 2012-06-20: qty 2

## 2012-06-20 MED ORDER — PENTAFLUOROPROP-TETRAFLUOROETH EX AERO: 1.0000 "application " | INHALATION_SPRAY | CUTANEOUS | Status: DC | PRN

## 2012-06-20 MED ORDER — DARBEPOETIN ALFA-POLYSORBATE 60 MCG/0.3ML IJ SOLN
INTRAMUSCULAR | Status: AC
  Filled 2012-06-20: qty 0.3

## 2012-06-20 NOTE — Progress Notes (Signed)
Interpreter Lesle Chris for RN assistant

## 2012-06-20 NOTE — Progress Notes (Signed)
[facility] PROGRESS NOTE  Alvin Daniels [id number] DOB: Jul 28, 1970 DOA: 06/16/2012 PCP: Default, Provider, MD  Brief narrative: Alvin Daniels is a 42 year old man with past medical history of HIV, hepatitis C, end-stage renal disease, orthostatic hypotension treated with midodrine, and chronic diarrhea (C. difficile PCR studies -07/15/2011. No ova or parasites seen) who was admitted on 06/17/2012 with hypotension in the setting of chronic diarrhea, untreated HIV, and inability to procure and take midodrine.  Assessment/Plan: Principal Problem:  *Hypotension: suspect BP soft at baseline and worsened by ESRD and diarrhea EF of 55% in 1/14, Random cortisol 6.9 -Exacerbated by inability to afford chronic medications to treat this (Midodrine). -Chronic diarrhea likely contributory.  -increased midodrine to 10mg  TID, unable to afford this and case management attempting to d/w administration, since this will be a recurrent long standing problem  Chronic diarrhea -Check stool for C&S, C. difficile PCR , cryptosporidium, Microsporidia, giardia, pending collection of specimen by the nursing staff. No diarrhea/BMs yesterday -Cut down empiric Lomotil and change to PRN -Status post upper endoscopy and colonoscopy 07/17/2011 with gastric pathology negative for H.   pylori, esophageal biopsy positive for some inflammation, and colonic mucosa biopsy negative.   Metabolic bone disease -Intact PTH 77 on 06/11/2012. Continue Hectorol.   Headache -CT of head done 06/16/2012. - -improved, without other red flags, exam benign, - ID consult noted, no need for LP at this time.   Normocytic anemia -Anemia secondary to end-stage renal disease. Hemoglobin stable. No current indication for transfusion.   Severe protein calorie malnutrition -Seen by dietitian 06/17/2012. Continue Nepro shakes.   HIV INFECTION -Last CD4 count done 03/29/2011:294. Repeated. Results pending. -ID, Dr. [name]  attempting  to get him his HIV medicines free through his clinic.   RENAL FAILURE, END STAGE, secondary to diabetic nephropathy -Gets dialysis at the [facility] kidney [facility]: Tu/Th/Sat. -per renal.   DM -CBGs 62-150.  Continue insulin sensitive sliding scale.    Code Status: Full.  Family Communication: None present. Disposition Plan: Home when stable.   Medical Consultants:   Dr. Megan Salon, [name].  Dr. Mauricia Area, Nephrology.  Other Consultants:  Dietitian  Anti-infectives:  None.  HPI/Subjective: C/o mild headache frontal and radiates parietally to occiput and neck, Reports that he has had 3 loose stools over the past 24 hours.   Objective: Filed Vitals:   06/19/12 1241 06/19/12 1726 06/19/12 2145 06/20/12 0542  BP: 87/56 90/58 125/81 118/75  Pulse: 86 82 85 78  Temp:  98 F (36.7 C) 98.5 F (36.9 C) 97.6 F (36.4 C)  TempSrc:  Oral Oral Oral  Resp:  18 18 16   Height:   5\' 2"  (1.575 m)   Weight:   53.116 kg (117 lb 1.6 oz)   SpO2:  98% 98% 99%    Intake/Output Summary (Last 24 hours) at 06/20/12 0901 Last data filed at 06/19/12 1700  Gross per 24 hour  Intake    240 ml  Output      0 ml  Net    240 ml    Exam: Gen:  NAD Cardiovascular:  RRR, No M/R/G Respiratory:  Lungs diminished Gastrointestinal:  Abdomen soft, NT/ND, + BS Extremities:  No C/E/C  Data Reviewed: Basic Metabolic Panel:  Recent Labs Lab 06/16/12 1836 06/17/12 0056 06/17/12 0643 06/20/12 0546  NA 136  --  135 133*  K 4.0  --  3.9 4.2  CL 99  --  102 96  CO2 29  --  23 26  GLUCOSE  102*  --  147* 128*  BUN 19  --  27* 37*  CREATININE 5.71* 6.58* 7.30* 8.03*  CALCIUM 8.8  --  8.5 8.5   GFR Estimated Creatinine Clearance: 9.1 ml/min (by C-G formula based on Cr of 8.03).  CBC:  Recent Labs Lab 06/16/12 1836 06/17/12 0056 06/17/12 0643  WBC 6.5 5.9 6.1  HGB 11.4* 10.2* 10.6*  HCT 34.2* 30.1* 31.1*  MCV 92.9 92.9 92.0  PLT 196 167 176   Cardiac  Enzymes:  Recent Labs Lab 06/17/12 0100 06/17/12 0643 06/17/12 1400  TROPONINI <0.30 <0.30 <0.30   CBG:  Recent Labs Lab 06/19/12 0744 06/19/12 1130 06/19/12 1637 06/19/12 2138 06/20/12 0732  GLUCAP 147* 139* 212* 135* 127*    Procedures and Diagnostic Studies: Dg Chest 2 View  06/16/2012  *RADIOLOGY REPORT*  Clinical Data: Low blood pressure and dizziness.  CHEST - 2 VIEW  Comparison: 10/08/2011  Findings: Slightly shallow inspiration.  Normal heart size and pulmonary vascularity.  Central interstitial changes and peribronchial thickening suggesting chronic bronchitis.  No evidence of focal consolidation or airspace disease.  No blunting of costophrenic angles.  No pneumothorax.  Mediastinal contours appear intact.  No significant changes since the previous study.  IMPRESSION: Chronic bronchitic changes.  No evidence of active pulmonary disease.   Original Report Authenticated By: Lucienne Capers, M.D.    Ct Head Wo Contrast  06/16/2012  *RADIOLOGY REPORT*  Clinical Data: Headache and hypotensive since dialysis.  CT HEAD WITHOUT CONTRAST  Technique:  Contiguous axial images were obtained from the base of the skull through the vertex without contrast.  Comparison: 06/02/2012  Findings: There is no intra or extra-axial fluid collection or mass lesion.  The basilar cisterns and ventricles have a normal appearance.  There is no CT evidence for acute infarction or hemorrhage.  Bone windows show no calvarial fracture.  Paranasal sinuses are clear.  IMPRESSION: Negative exam.   Original Report Authenticated By: Nolon Nations, M.D.    Scheduled Meds: . diphenoxylate-atropine  2 tablet Oral QID  . doxercalciferol  1 mcg Intravenous Q T,Th,Sa-HD  . heparin  5,000 Units Subcutaneous Q8H  . insulin aspart  0-9 Units Subcutaneous TID WC  . midodrine  10 mg Oral TID WC  . sodium chloride  3 mL Intravenous Q12H   Continuous Infusions:    Time spent: 25 minutes.   LOS: 4 days    [facility] Pager [phone].  If 8PM-8AM, please contact night-coverage at [email or web address], password [facility] 06/20/2012, 9:01 AM

## 2012-06-20 NOTE — Progress Notes (Signed)
Patient was told with the interpreter that we needed a stool sample - put a collection hat in the toliet.  The patient still removed the collection container when using the bathroom.  Explained again that we need a stool sample and placed the collection container in the toilet again.

## 2012-06-20 NOTE — Progress Notes (Signed)
[facility] Progress Note  Subjective:   Walking around room. Ongoing complaint of headache and neck pain.  Denies fatigue or diarrhea.  Objective Filed Vitals:   06/19/12 1241 06/19/12 1726 06/19/12 2145 06/20/12 0542  BP: 87/56 90/58 125/81 118/75  Pulse: 86 82 85 78  Temp:  98 F (36.7 C) 98.5 F (36.9 C) 97.6 F (36.4 C)  TempSrc:  Oral Oral Oral  Resp:  18 18 16   Height:   5\' 2"  (1.575 m)   Weight:   53.116 kg (117 lb 1.6 oz)   SpO2:  98% 98% 99%   Physical Exam General: Well developed, well-nourished, NAD Heart: RRR, no m/r/g appreciated Lungs: Mostly clear. Diminished bs at bases Abdomen: soft, non-tender, non-distended. Normal Bs liver down 4 cm Extremities: Lt elbow bleeding abrasion. Bilateral LE's with numerous burn-like scars. No edema Dialysis Access: LFA AVF with + bruit  Dialysis Orders: [facility]: [facility] Optiflux 160 4h 400/A 1.5 2K 2.25 Ca left upper AVF var Na, standard heparin hectorol 1 EDW is 54.5, but consistently comes in below and leaves below EDW, 36 degrees   Assessment/Plan: 1.  Hypotension - chronic; SBPs variable; Lacks money to buy midodrine - systolic BPs at his dialysis center are routinely between 60 and 80; Inpatient BPs wnl on midodrine TID - falsely elevated.  Will hold midodrine for SBP > 85 per primary, 2D Echo EF in January 55%; cortisol 6.9 NEEDS LOWER EDW at D/C 2. ESRD - TTS - Hd Sat. For HD today 3. Headache - neg head CT - Dr. Megan Salon has seen for ID  - no indication for LP at this time 4. HIV - Had HIV meds on 1/22 d/c med list -but not taking; ; Quant HIV 4332 2/05 CD4 300; ID/SW working to obtain antiretroviral meds through the state AIDS drug assistance program (ADAP) as well as op clinic follow-up 5. Chronic diarrhea - work up per primary/ID. No diarrhea documented this hospital stay. Stool culture pending. States not eating but 100% of diet tray consumed this a.m. 6. Type 2 DM - BS 100s - 140s; recent Hgb A1c 6.1-   7. Anemia - Hgb 10.6 on admit.  Now 11.4. Hold ESAs and monitor. 8. Metabolic bone disease - iPTH 77 1/30 - hectorol just decreased to 1 mcg q HD P controlled without binders. 9. Nutrition - renal diet and multivitmin   Santiago Glad E. Rhodia Albright [facility] [phone] pager 06/20/2012,9:20 AM  LOS: 4 days    Additional Objective Labs: Basic Metabolic Panel:  Recent Labs Lab 06/16/12 1836 06/17/12 0056 06/17/12 0643 06/20/12 0546  NA 136  --  135 133*  K 4.0  --  3.9 4.2  CL 99  --  102 96  CO2 29  --  23 26  GLUCOSE 102*  --  147* 128*  BUN 19  --  27* 37*  CREATININE 5.71* 6.58* 7.30* 8.03*  CALCIUM 8.8  --  8.5 8.5   CBC:  Recent Labs Lab 06/16/12 1836 06/17/12 0056 06/17/12 0643  WBC 6.5 5.9 6.1  HGB 11.4* 10.2* 10.6*  HCT 34.2* 30.1* 31.1*  MCV 92.9 92.9 92.0  PLT 196 167 176   Blood Culture    Component Value Date/Time   SDES BLOOD RIGHT WRIST 01/11/2011 0026   SPECREQUEST BOTTLES DRAWN AEROBIC AND ANAEROBIC 10CC 01/11/2011 0026   CULT NO GROWTH 5 DAYS 01/11/2011 0026   REPTSTATUS 01/17/2011 FINAL 01/11/2011 0026    Cardiac Enzymes:  Recent Labs  Lab 06/17/12 0100 06/17/12 0643 06/17/12 1400  TROPONINI <0.30 <0.30 <0.30   CBG:  Recent Labs Lab 06/19/12 0744 06/19/12 1130 06/19/12 1637 06/19/12 2138 06/20/12 0732  GLUCAP 147* 139* 212* 135* 127*   IMedications:   . diphenoxylate-atropine  2 tablet Oral QID  . doxercalciferol  1 mcg Intravenous Q T,Th,Sa-HD  . heparin  5,000 Units Subcutaneous Q8H  . insulin aspart  0-9 Units Subcutaneous TID WC  . midodrine  10 mg Oral TID WC  . sodium chloride  3 mL Intravenous Q12H

## 2012-06-21 LAB — GLUCOSE, CAPILLARY
Glucose-Capillary: 123 mg/dL — ABNORMAL HIGH (ref 70–99)
Glucose-Capillary: 170 mg/dL — ABNORMAL HIGH (ref 70–99)
Glucose-Capillary: 125 mg/dL — ABNORMAL HIGH (ref 70–99)

## 2012-06-21 MED ORDER — CALCIUM CARBONATE ANTACID 500 MG PO CHEW
1.0000 | CHEWABLE_TABLET | Freq: Three times a day (TID) | ORAL | Status: DC
  Administered 2012-06-21 – 2012-06-24 (×9): 200 mg via ORAL
  Filled 2012-06-21 (×12): qty 1

## 2012-06-21 MED ORDER — LOPERAMIDE HCL 2 MG PO CAPS
2.0000 mg | ORAL_CAPSULE | ORAL | Status: DC | PRN
  Administered 2012-06-22: 2 mg via ORAL
  Filled 2012-06-21: qty 1

## 2012-06-21 NOTE — Progress Notes (Signed)
[facility] PROGRESS NOTE  Alvin Daniels [id number] DOB: 03/20/1971 DOA: 06/16/2012 PCP: Default, Provider, MD  Brief narrative: [name] is a 42 year old man with past medical history of HIV, hepatitis C, end-stage renal disease, orthostatic hypotension treated with midodrine, and chronic diarrhea (C. difficile PCR studies -07/15/2011. No ova or parasites seen) who was admitted on 06/17/2012 with hypotension in the setting of chronic diarrhea, untreated HIV, and inability to procure and take midodrine.  Assessment/Plan:     *Hypotension: suspect BP soft at baseline and worsened by ESRD and diarrhea EF of 55% in 1/14, Random cortisol 6.9 -Exacerbated by inability to afford chronic medications to treat this (Midodrine). -Chronic diarrhea likely contributory.  -increased midodrine to 10mg  TID, unable to afford this and case management attempting to d/w administration, since this will be a recurrent long standing problem     Chronic diarrhea -Check stool for C&S, -ve C. difficile PCR ,add imodium PRM , pending cryptosporidium, Microsporidia, giardia, pending collection of specimen by the nursing staff. No diarrhea/BMs yesterday  -Status post upper endoscopy and colonoscopy 07/17/2011 with gastric pathology negative for H.   pylori, esophageal biopsy positive for some inflammation, and colonic mucosa biopsy negative.   Outpt GI follow post DC.     Metabolic bone disease -Intact PTH 77 on 06/11/2012. Continue Hectorol.     Headache -CT of head done 06/16/2012. - -improved, without other red flags, exam benign, - ID consult noted, no need for LP at this time. Currently no headache     Normocytic anemia -Anemia secondary to end-stage renal disease. Hemoglobin stable. No current indication for transfusion.     Severe protein calorie malnutrition -Seen by dietitian 06/17/2012. Continue Nepro shakes.     HIV INFECTION -Last CD4 count is 300  -ID, Dr. [name]   attempting to get him his HIV medicines free through his clinic. ID signed off here, outpt ID folow up post DC     RENAL FAILURE, END STAGE, secondary to diabetic nephropathy, K 5.2 -Gets dialysis at the [facility] kidney [facility]: Tu/Th/Sat. -per renal.     DM  Continue insulin sensitive sliding scale.   CBG (last 3)   Recent Labs  06/20/12 1633 06/20/12 2058 06/21/12 0716  GLUCAP 149* 150* 123*      Code Status: Full.  Family Communication: None present. Disposition Plan: Home when stable.   Medical Consultants:   Dr. Megan Salon, [place].  Dr. Mauricia Area, Nephrology.  Other Consultants:  Dietitian  Anti-infectives:  None.  HPI/Subjective: No , Reports that he has had 3 loose stools over the past 24 hours. No chest-Abd pain  Objective: Filed Vitals:   06/21/12 0133 06/21/12 0152 06/21/12 0511 06/21/12 0815  BP: 107/69 96/51 103/64 84/51  Pulse: 80 83 89 83  Temp: 97.1 F (36.2 C) 97.8 F (36.6 C) 98 F (36.7 C) 98.6 F (37 C)  TempSrc: Oral Oral Oral Oral  Resp: 18 18 17 18   Height:      Weight: 52.3 kg (115 lb 4.8 oz)     SpO2: 96% 100% 98% 98%    Intake/Output Summary (Last 24 hours) at 06/21/12 1048 Last data filed at 06/21/12 0133  Gross per 24 hour  Intake    120 ml  Output      0 ml  Net    120 ml    Exam: Gen:  NAD Cardiovascular:  RRR, No M/R/G Respiratory:  Lungs diminished Gastrointestinal:  Abdomen soft, NT/ND, + BS Extremities:  No C/E/C  Data  Reviewed: Basic Metabolic Panel:  Recent Labs Lab 06/16/12 1836 06/17/12 0056 06/17/12 [id number] 06/20/12 0546 06/20/12 2126  NA 136  --  135 133* 131*  K 4.0  --  3.9 4.2 5.2*  CL 99  --  102 96 96  CO2 29  --  23 26 21   GLUCOSE 102*  --  147* 128* 169*  BUN 19  --  27* 37* 48*  CREATININE 5.71* 6.58* 7.30* 8.03* 10.18*  CALCIUM 8.8  --  8.5 8.5 8.6  PHOS  --   --   --   --  5.1*   GFR Estimated Creatinine Clearance: 7.1 ml/min (by C-G formula based on Cr of  10.18).  CBC:  Recent Labs Lab 06/16/12 1836 06/17/12 0056 06/17/12 0643 06/20/12 2125  WBC 6.5 5.9 6.1 6.1  HGB 11.4* 10.2* 10.6* 11.1*  HCT 34.2* 30.1* 31.1* 31.8*  MCV 92.9 92.9 92.0 90.9  PLT 196 167 176 190   Cardiac Enzymes:  Recent Labs Lab 06/17/12 0100 06/17/12 0643 06/17/12 1400  TROPONINI <0.30 <0.30 <0.30   CBG:  Recent Labs Lab 06/20/12 0732 06/20/12 1130 06/20/12 1633 06/20/12 2058 06/21/12 0716  GLUCAP 127* 199* 149* 150* 123*    Procedures and Diagnostic Studies: Dg Chest 2 View  06/16/2012  *RADIOLOGY REPORT*  Clinical Data: Low blood pressure and dizziness.  CHEST - 2 VIEW  Comparison: 10/08/2011  Findings: Slightly shallow inspiration.  Normal heart size and pulmonary vascularity.  Central interstitial changes and peribronchial thickening suggesting chronic bronchitis.  No evidence of focal consolidation or airspace disease.  No blunting of costophrenic angles.  No pneumothorax.  Mediastinal contours appear intact.  No significant changes since the previous study.  IMPRESSION: Chronic bronchitic changes.  No evidence of active pulmonary disease.   Original Report Authenticated By: Lucienne Capers, M.D.    Ct Head Wo Contrast  06/16/2012  *RADIOLOGY REPORT*  Clinical Data: Headache and hypotensive since dialysis.  CT HEAD WITHOUT CONTRAST  Technique:  Contiguous axial images were obtained from the base of the skull through the vertex without contrast.  Comparison: 06/02/2012  Findings: There is no intra or extra-axial fluid collection or mass lesion.  The basilar cisterns and ventricles have a normal appearance.  There is no CT evidence for acute infarction or hemorrhage.  Bone windows show no calvarial fracture.  Paranasal sinuses are clear.  IMPRESSION: Negative exam.   Original Report Authenticated By: Nolon Nations, M.D.    Scheduled Meds: . calcium carbonate  1 tablet Oral TID WC  . darbepoetin (ARANESP) injection - DIALYSIS  60 mcg Intravenous Q  Sat-HD  . diphenoxylate-atropine  2 tablet Oral QID  . doxercalciferol  1 mcg Intravenous Q T,Th,Sa-HD  . heparin  5,000 Units Subcutaneous Q8H  . insulin aspart  0-9 Units Subcutaneous TID WC  . multivitamin  1 tablet Oral Daily  . sodium chloride  3 mL Intravenous Q12H   Continuous Infusions:    Time spent: 25 minutes.   LOS: 5 days   SINGH,PRASHANT K  [facility]  If 8PM-8AM, please contact night-coverage at [email or web address], password [facility] 06/21/2012, 10:48 AM

## 2012-06-21 NOTE — Progress Notes (Signed)
[facility] Progress Note  Subjective:   Sitting on side of bed eating breakfast.  Headache is better.  No other complaints  Objective Filed Vitals:   06/21/12 0128 06/21/12 0133 06/21/12 0152 06/21/12 0511  BP: 83/56 107/69 96/51 103/64  Pulse: 80 80 83 89  Temp:  97.1 F (36.2 C) 97.8 F (36.6 C) 98 F (36.7 C)  TempSrc:  Oral Oral Oral  Resp:  18 18 17   Height:      Weight:  52.3 kg (115 lb 4.8 oz)    SpO2:  96% 100% 98%   Physical Exam General: Alert, oriented, NAD Heart:RRR, no m/r/g appreciated Gr 2/6 holosys M Lungs: CTA bilaterally.  No wheezes, rales or rhonchi noted Abdomen: Soft, non-tender, non-distended. Normal BS Extremities: No LE edema.  Burn-like scars to bilateral LE's.  Lt elbow wound covered. Dialysis Access: LFA AVF patent  Dialysis Orders: [facility]: [facility] Optiflux 160 4h 400/A 1.5 2K 2.25 Ca left upper AVF var Na, standard heparin hectorol 1 EDW is 54.5, but consistently comes in below and leaves below EDW, 36 degrees   Assessment/Plan: 1. Hypotension - chronic; SBPs variable; Lacks money to buy midodrine - systolic BPs at his dialysis center are routinely between 60 and 80; Inpatient BPs wnl on midodrine TID - falsely elevated. Holding midodrine for SBP > 85 per primary.  Required 2 doses in last 24 hours. 2D Echo EF in January 55%; cortisol 6.9 NEEDS LOWER EDW at D/C  2. ESRD - [facility] @ [facility]. K+ 5.2 3. Headache - neg head CT - Dr. Megan Salon has seen for ID - no indication for LP at this time some better 4. HIV - Had HIV meds on 1/22 d/c med list -but not taking; ; Quant HIV 4332 2/05 CD4 300; ID/SW working to obtain antiretroviral meds through the state AIDS drug assistance program (ADAP) as well as op clinic follow-up  5. Chronic diarrhea - work up per primary/ID. No diarrhea documented this hospital stay. Stool culture pending. Pt continues to remove collection hat from toilet despite instructions from interpreter regarding the need for a  sample. 6. Type 2 DM - BS 102s - 190s; recent Hgb A1c 6.1- On insulin per primary. Some better. 7. Anemia - Hgb 11.1. On Aranesp 60. Iron studies with next hd.  8. Metabolic bone disease - Ca 8.6.(9.6  Corrected) iPTH 77 1/30 - hectorol just decreased to 1 mcg q HD P 5.1, not currently on a binder. Will add Tums 500 mg TID WC 9. Nutrition - renal diet and multivitamin 10. Dispo - ??  Collene Leyden. Rhodia Albright [facility] [phone] pager 06/21/2012,9:32 AM  LOS: 5 days  I have seen and examined this patient and agree with the plan of care  With  Changes above. I do not know if we have done anything .  Donaciano Range L 06/21/2012, 11:39 AM    Additional Objective Labs: Basic Metabolic Panel:  Recent Labs Lab 06/17/12 0643 06/20/12 0546 06/20/12 2126  NA 135 133* 131*  K 3.9 4.2 5.2*  CL 102 96 96  CO2 23 26 21   GLUCOSE 147* 128* 169*  BUN 27* 37* 48*  CREATININE 7.30* 8.03* 10.18*  CALCIUM 8.5 8.5 8.6  PHOS  --   --  5.1*   Liver Function Tests:  Recent Labs Lab 06/20/12 2126  ALBUMIN 2.7*   CBC:  Recent Labs Lab 06/16/12 1836 06/17/12 0056 06/17/12 0643 06/20/12 2125  WBC 6.5 5.9 6.1 6.1  HGB  11.4* 10.2* 10.6* 11.1*  HCT 34.2* 30.1* 31.1* 31.8*  MCV 92.9 92.9 92.0 90.9  PLT 196 167 176 190   Blood Culture    Component Value Date/Time   SDES BLOOD RIGHT WRIST 01/11/2011 0026   SPECREQUEST BOTTLES DRAWN AEROBIC AND ANAEROBIC 10CC 01/11/2011 0026   CULT NO GROWTH 5 DAYS 01/11/2011 0026   REPTSTATUS 01/17/2011 FINAL 01/11/2011 0026    Cardiac Enzymes:  Recent Labs Lab 06/17/12 0100 06/17/12 0643 06/17/12 1400  TROPONINI <0.30 <0.30 <0.30   CBG:  Recent Labs Lab 06/20/12 0732 06/20/12 1130 06/20/12 1633 06/20/12 2058 06/21/12 0716  GLUCAP 127* 199* 149* 150* 123*   Medications:   . darbepoetin (ARANESP) injection - DIALYSIS  60 mcg Intravenous Q Sat-HD  . diphenoxylate-atropine  2 tablet Oral QID  . doxercalciferol  1 mcg  Intravenous Q T,Th,Sa-HD  . heparin  5,000 Units Subcutaneous Q8H  . insulin aspart  0-9 Units Subcutaneous TID WC  . multivitamin  1 tablet Oral Daily  . sodium chloride  3 mL Intravenous Q12H

## 2012-06-22 ENCOUNTER — Telehealth: Payer: Self-pay | Admitting: *Deleted

## 2012-06-22 ENCOUNTER — Other Ambulatory Visit: Payer: Self-pay | Admitting: Internal Medicine

## 2012-06-22 DIAGNOSIS — I959 Hypotension, unspecified: Principal | ICD-10-CM

## 2012-06-22 DIAGNOSIS — R197 Diarrhea, unspecified: Secondary | ICD-10-CM

## 2012-06-22 DIAGNOSIS — B20 Human immunodeficiency virus [HIV] disease: Secondary | ICD-10-CM

## 2012-06-22 LAB — BASIC METABOLIC PANEL
BUN: 28 mg/dL — ABNORMAL HIGH (ref 6–23)
CO2: 29 mEq/L (ref 19–32)
Chloride: 95 mEq/L — ABNORMAL LOW (ref 96–112)
Creatinine, Ser: 8.13 mg/dL — ABNORMAL HIGH (ref 0.50–1.35)
Glucose, Bld: 150 mg/dL — ABNORMAL HIGH (ref 70–99)

## 2012-06-22 LAB — CRYPTOSPORIDIUM SMEAR, FECAL

## 2012-06-22 LAB — GLUCOSE, CAPILLARY: Glucose-Capillary: 151 mg/dL — ABNORMAL HIGH (ref 70–99)

## 2012-06-22 MED ORDER — HEPARIN SODIUM (PORCINE) 1000 UNIT/ML DIALYSIS
100.0000 [IU]/kg | INTRAMUSCULAR | Status: DC | PRN
  Administered 2012-06-23: 5200 [IU] via INTRAVENOUS_CENTRAL
  Filled 2012-06-22: qty 6

## 2012-06-22 MED ORDER — DARUNAVIR ETHANOLATE 800 MG PO TABS
800.0000 mg | ORAL_TABLET | Freq: Every day | ORAL | Status: DC
  Administered 2012-06-23 – 2012-06-24 (×2): 800 mg via ORAL
  Filled 2012-06-22 (×3): qty 1

## 2012-06-22 MED ORDER — EMTRICITABINE 200 MG PO CAPS
200.0000 mg | ORAL_CAPSULE | ORAL | Status: DC
  Administered 2012-06-23: 200 mg via ORAL
  Filled 2012-06-22: qty 1

## 2012-06-22 MED ORDER — DIPHENOXYLATE-ATROPINE 2.5-0.025 MG PO TABS
1.0000 | ORAL_TABLET | Freq: Four times a day (QID) | ORAL | Status: DC
  Administered 2012-06-22 – 2012-06-24 (×7): 1 via ORAL
  Filled 2012-06-22 (×4): qty 1
  Filled 2012-06-22: qty 2
  Filled 2012-06-22: qty 1

## 2012-06-22 MED ORDER — HEPARIN SODIUM (PORCINE) 1000 UNIT/ML DIALYSIS
1000.0000 [IU] | INTRAMUSCULAR | Status: DC | PRN
  Filled 2012-06-22: qty 1

## 2012-06-22 MED ORDER — ALTEPLASE 2 MG IJ SOLR
2.0000 mg | Freq: Once | INTRAMUSCULAR | Status: AC | PRN
  Filled 2012-06-22: qty 2

## 2012-06-22 MED ORDER — PENTAFLUOROPROP-TETRAFLUOROETH EX AERO: 1.0000 "application " | INHALATION_SPRAY | CUTANEOUS | Status: DC | PRN

## 2012-06-22 MED ORDER — TENOFOVIR DISOPROXIL FUMARATE 300 MG PO TABS
300.0000 mg | ORAL_TABLET | ORAL | Status: DC
  Administered 2012-06-22: 300 mg via ORAL
  Filled 2012-06-22: qty 1

## 2012-06-22 MED ORDER — LIDOCAINE-PRILOCAINE 2.5-2.5 % EX CREA: 1.0000 "application " | TOPICAL_CREAM | CUTANEOUS | Status: DC | PRN

## 2012-06-22 MED ORDER — SODIUM CHLORIDE 0.9 % IV SOLN: 100.0000 mL | INTRAVENOUS | Status: DC | PRN

## 2012-06-22 MED ORDER — IBUPROFEN 400 MG PO TABS
400.0000 mg | ORAL_TABLET | ORAL | Status: DC | PRN
  Filled 2012-06-22: qty 1

## 2012-06-22 MED ORDER — NEPRO/CARBSTEADY PO LIQD: 237.0000 mL | ORAL | Status: DC | PRN

## 2012-06-22 MED ORDER — RITONAVIR 100 MG PO TABS
100.0000 mg | ORAL_TABLET | Freq: Every day | ORAL | Status: DC
  Administered 2012-06-23 – 2012-06-24 (×2): 100 mg via ORAL
  Filled 2012-06-22 (×3): qty 1

## 2012-06-22 MED ORDER — LIDOCAINE HCL (PF) 1 % IJ SOLN: 5.0000 mL | INTRAMUSCULAR | Status: DC | PRN

## 2012-06-22 NOTE — Care Management Note (Signed)
CM has been unable to arrange a resource for Midodrine for this pt . Will ask for assistance thru [facility] program , however this will provide a 30 day supply only and pt will not be eligible for anymore assistance with medications for the next year.  Jasmine Pang RN MPH [phone]

## 2012-06-22 NOTE — Progress Notes (Addendum)
[facility] PROGRESS NOTE  Alvin Daniels [id number] DOB: 1971-04-19 DOA: 06/16/2012 PCP: Default, Provider, MD  Brief narrative: Alvin Daniels is a 42 year old man with past medical history of HIV, hepatitis C, end-stage renal disease, orthostatic hypotension treated with midodrine, and chronic diarrhea (C. difficile PCR studies -07/15/2011. No ova or parasites seen) who was admitted on 06/17/2012 with hypotension in the setting of chronic diarrhea, untreated HIV, and inability to procure and take midodrine.  Assessment/Plan:     *Hypotension: suspect BP soft at baseline and worsened by ESRD and diarrhea EF of 55% in 1/14, Random cortisol 6.9 -Exacerbated by inability to afford chronic medications to treat this (Midodrine). -Chronic diarrhea likely contributory.  -increased midodrine to 10mg  TID, unable to afford this and case management attempting to d/w administration, since this will be a recurrent long standing problem   Chronic diarrhea -Check stool for C&S, -ve C. difficile PCR ,add imodium PRM , pending cryptosporidium, Microsporidia, giardia, negative. -Status post upper endoscopy and colonoscopy 07/17/2011 with gastric pathology negative for H.   pylori, esophageal biopsy positive for some inflammation, and colonic mucosa biopsy negative. -Will try Imodium as this does not appear to be infection related -     Metabolic bone disease -Intact PTH 77 on 06/11/2012. Continue Hectorol.    Headache -CT of head done 06/16/2012. - -improved, without other red flags, exam benign, - ID consult noted, no need for LP at this time.  Neck pain May be related to above-mentioned headache -when necessary ibuprofen     Normocytic anemia -Anemia secondary to end-stage renal disease. Hemoglobin stable. No current indication for transfusion.     Severe protein calorie malnutrition -Seen by dietitian 06/17/2012. Continue Nepro shakes.     HIV INFECTION -Last CD4 count is 300   -ID, Dr. [name]  attempting to get him his HIV medicines free through his clinic. ID signed off here, outpt ID folow up post DC     RENAL FAILURE, END STAGE, secondary to diabetic nephropathy, K 5.2 -Gets dialysis at the [facility] kidney Center: Tu/Th/Sat. -per renal.     DM  Continue insulin sensitive sliding scale.   CBG (last 3)   Recent Labs  06/21/12 2112 06/22/12 0725 06/22/12 1132  GLUCAP 141* 104* 144*      Code Status: Full.  Family Communication: None present. Disposition Plan: Home when stable.   Medical Consultants:   Dr. Megan Salon, [place].  Dr. Mauricia Area, Nephrology.  Other Consultants:  Dietitian  Anti-infectives:  None.  HPI/Subjective: he's concerned about his diarrhea stating that he had about 6 bowel movements yesterday. No complaints of nausea. He is eating his breakfast. No abdominal pain. States that loose stools occur at night as well as during the daytime. No blood noted in stools. Also complaining of pain in the back of his neck which has been going on for about 3 days now and mostly occurs with movement.  Objective: Filed Vitals:   06/21/12 1755 06/21/12 2116 06/22/12 0529 06/22/12 0943  BP: 124/80 130/84 109/78 101/64  Pulse: 81 84 79 79  Temp: 99 F (37.2 C) 98.5 F (36.9 C) 98.8 F (37.1 C) 98 F (36.7 C)  TempSrc: Oral Oral Oral Oral  Resp: 18 17 18 18   Height:  5\' 2"  (1.575 m)    Weight:  52.345 kg (115 lb 6.4 oz)    SpO2: 99% 99% 99% 98%    Intake/Output Summary (Last 24 hours) at 06/22/12 1401 Last data filed at 06/22/12 0900  Gross  per 24 hour  Intake    360 ml  Output      0 ml  Net    360 ml    Exam: Gen:  NAD Cardiovascular:  RRR, No M/R/G Respiratory:  Lungs diminished Gastrointestinal:  Abdomen soft, NT/ND, + BS Extremities:  No C/E/C  Data Reviewed: Basic Metabolic Panel:  Recent Labs Lab 06/16/12 1836 06/17/12 0056 06/17/12 0643 06/20/12 0546 06/20/12 2126 06/22/12 1230   NA 136  --  135 133* 131* 136  K 4.0  --  3.9 4.2 5.2* 4.1  CL 99  --  102 96 96 95*  CO2 29  --  23 26 21 29   GLUCOSE 102*  --  147* 128* 169* 150*  BUN 19  --  27* 37* 48* 28*  CREATININE 5.71* 6.58* 7.30* 8.03* 10.18* 8.13*  CALCIUM 8.8  --  8.5 8.5 8.6 8.7  PHOS  --   --   --   --  5.1*  --    GFR Estimated Creatinine Clearance: 8.8 ml/min (by C-G formula based on Cr of 8.13).  CBC:  Recent Labs Lab 06/16/12 1836 06/17/12 0056 06/17/12 0643 06/20/12 2125  WBC 6.5 5.9 6.1 6.1  HGB 11.4* 10.2* 10.6* 11.1*  HCT 34.2* 30.1* 31.1* 31.8*  MCV 92.9 92.9 92.0 90.9  PLT 196 167 176 190   Cardiac Enzymes:  Recent Labs Lab 06/17/12 0100 06/17/12 0643 06/17/12 1400  TROPONINI <0.30 <0.30 <0.30   CBG:  Recent Labs Lab 06/21/12 1105 06/21/12 1715 06/21/12 2112 06/22/12 0725 06/22/12 1132  GLUCAP 170* 125* 141* 104* 144*    Procedures and Diagnostic Studies: Dg Chest 2 View  06/16/2012  *RADIOLOGY REPORT*  Clinical Data: Low blood pressure and dizziness.  CHEST - 2 VIEW  Comparison: 10/08/2011  Findings: Slightly shallow inspiration.  Normal heart size and pulmonary vascularity.  Central interstitial changes and peribronchial thickening suggesting chronic bronchitis.  No evidence of focal consolidation or airspace disease.  No blunting of costophrenic angles.  No pneumothorax.  Mediastinal contours appear intact.  No significant changes since the previous study.  IMPRESSION: Chronic bronchitic changes.  No evidence of active pulmonary disease.   Original Report Authenticated By: Lucienne Capers, M.D.    Ct Head Wo Contrast  06/16/2012  *RADIOLOGY REPORT*  Clinical Data: Headache and hypotensive since dialysis.  CT HEAD WITHOUT CONTRAST  Technique:  Contiguous axial images were obtained from the base of the skull through the vertex without contrast.  Comparison: 06/02/2012  Findings: There is no intra or extra-axial fluid collection or mass lesion.  The basilar cisterns and  ventricles have a normal appearance.  There is no CT evidence for acute infarction or hemorrhage.  Bone windows show no calvarial fracture.  Paranasal sinuses are clear.  IMPRESSION: Negative exam.   Original Report Authenticated By: Nolon Nations, M.D.    Scheduled Meds: . calcium carbonate  1 tablet Oral TID WC  . darbepoetin (ARANESP) injection - DIALYSIS  60 mcg Intravenous Q Sat-HD  . [START ON 06/23/2012] darunavir  800 mg Oral Q breakfast  . diphenoxylate-atropine  2 tablet Oral QID  . doxercalciferol  1 mcg Intravenous Q T,Th,Sa-HD  . [START ON 06/23/2012] emtricitabine  200 mg Oral 2 times weekly  . heparin  5,000 Units Subcutaneous Q8H  . insulin aspart  0-9 Units Subcutaneous TID WC  . multivitamin  1 tablet Oral Daily  . [START ON 06/23/2012] ritonavir  100 mg Oral Q breakfast  . sodium chloride  3 mL Intravenous Q12H  . tenofovir  300 mg Oral Weekly   Continuous Infusions:    Time spent: 30  minutes.   LOS: 6 days   [facility]  If 8PM-8AM, please contact night-coverage at [email or web address], password [facility] 06/22/2012, 2:01 PM

## 2012-06-22 NOTE — Progress Notes (Signed)
Patient ID: Alvin Daniels, male   DOB: 04/13/1971, 42 y.o.   MRN: [id number]         [place] for Infectious Disease    Date of Admission:  06/16/2012     Principal Problem:   Hypotension Active Problems:   HIV INFECTION   Chronic diarrhea   Headache   DIABETES MELLITUS, TYPE II, UNCONTROLLED, W/RENAL COMPS   RENAL FAILURE, END STAGE   Hepatitis C carrier   Polyneuropathy in diabetes(357.2)   Syncope and collapse   Severe protein-calorie malnutrition   Normocytic anemia   Metabolic bone disease   Anemia of chronic renal failure   . calcium carbonate  1 tablet Oral TID WC  . darbepoetin (ARANESP) injection - DIALYSIS  60 mcg Intravenous Q Sat-HD  . diphenoxylate-atropine  2 tablet Oral QID  . doxercalciferol  1 mcg Intravenous Q T,Th,Sa-HD  . heparin  5,000 Units Subcutaneous Q8H  . insulin aspart  0-9 Units Subcutaneous TID WC  . multivitamin  1 tablet Oral Daily  . sodium chloride  3 mL Intravenous Q12H    Subjective: No HA today. No diarrhea reported.  Objective: Temp:  [98 F (36.7 C)-99 F (37.2 C)] 98 F (36.7 C) (02/10 0943) Pulse Rate:  [79-87] 79 (02/10 0943) Resp:  [16-18] 18 (02/10 0943) BP: (81-130)/(53-84) 101/64 mmHg (02/10 0943) SpO2:  [98 %-100 %] 98 % (02/10 0943) Weight:  [52.345 kg (115 lb 6.4 oz)] 52.345 kg (115 lb 6.4 oz) (02/09 2116)  General: Alert and comfortable sitting outside of bed Skin: No rash Lungs: Clear Cor: Regular S1 and S2 no murmurs Abdomen: Nontender  Lab Results Lab Results  Component Value Date   WBC 6.1 06/20/2012   HGB 11.1* 06/20/2012   HCT 31.8* 06/20/2012   MCV 90.9 06/20/2012   PLT 190 06/20/2012    Lab Results  Component Value Date   CREATININE 10.18* 06/20/2012   BUN 48* 06/20/2012   NA 131* 06/20/2012   K 5.2* 06/20/2012   CL 96 06/20/2012   CO2 21 06/20/2012    HIV 1 RNA Quant (copies/mL)  Date Value  06/17/2012 4332*  03/29/2011 11400*  10/12/2010 181*     CD4 T Cell Abs (cmm)  Date Value  06/17/2012  300*  01/11/2011 390*  10/12/2010 150*   Microbiology: Recent Results (from the past 240 hour(s))  STOOL CULTURE     Status: None   Collection Time    06/20/12 11:51 AM      Result Value Range Status   Specimen Description STOOL   Final   Special Requests NONE   Final   Culture Culture reincubated for better growth   Final   Report Status PENDING   Incomplete  CLOSTRIDIUM DIFFICILE BY PCR     Status: None   Collection Time    06/20/12 11:51 AM      Result Value Range Status   C difficile by pcr NEGATIVE  NEGATIVE Final  CRYPTOSPORIDIUM SMEAR, FECAL     Status: None   Collection Time    06/20/12 11:51 AM      Result Value Range Status   Specimen Description STOOL   Final   Special Requests NONE   Final   Cryptosporidium Smear. NO Cryptosporidium Cyclospora or Isospora seen.   Final   Report Status 06/22/2012 FINAL   Final   Assessment: His paperwork has been filled out in her clinic to complete his AIDS drug assistance program (ADAP) reapplication. I will order refills of  his antiretroviral medications so he should be able to get a steady supply of outpatient medications very soon.  Plan: 1. Restart renally dosed Emtriva and Viread along with full dose Prezista and Norvir. 2. Follow up in [place] soon after discharge   Michel Bickers, MD [facility] for [place] [phone] pager   [phone] cell 06/22/2012, 11:33 AM

## 2012-06-22 NOTE — Progress Notes (Signed)
Subjective: Mild headache, also intermittent pain in posterior neck and dizziness when sitting up or standing; no appetite.  Objective: Vital signs in last 24 hours: Temp:  [98.5 F (36.9 C)-99 F (37.2 C)] 98.8 F (37.1 C) (02/10 0529) Pulse Rate:  [79-87] 79 (02/10 0529) Resp:  [16-18] 18 (02/10 0529) BP: (81-130)/(51-84) 109/78 mmHg (02/10 0529) SpO2:  [98 %-100 %] 99 % (02/10 0529) Weight:  [52.345 kg (115 lb 6.4 oz)] 52.345 kg (115 lb 6.4 oz) (02/09 2116) Weight change: -0.408 kg (-14.4 oz)  Intake/Output from previous day: 02/09 0701 - 02/10 0700 In: 600 [P.O.:600] Out: -    EXAM: General appearance:  Alert, in no apparent distress Resp:  CTA without rales, rhonchi, or wheezes Cardio:  RRR without murmur or rub GI:  + BS, soft and nontender Extremities:  No edema Access:  AVF @ LFA with + bruit  Lab Results:  Recent Labs  06/20/12 2125  WBC 6.1  HGB 11.1*  HCT 31.8*  PLT 190   BMET:  Recent Labs  06/20/12 0546 06/20/12 2126  NA 133* 131*  K 4.2 5.2*  CL 96 96  CO2 26 21  GLUCOSE 128* 169*  BUN 37* 48*  CREATININE 8.03* 10.18*  CALCIUM 8.5 8.6  ALBUMIN  --  2.7*   No results found for this basename: PTH,  in the last 72 hours Iron Studies: No results found for this basename: IRON, TIBC, TRANSFERRIN, FERRITIN,  in the last 72 hours  Dialysis Orders: Center: [facility] Optiflux 160 4h 400/A 1.5 2K 2.25 Ca left upper AVF var Na, standard heparin hectorol 1 EDW is 54.5, but consistently comes in below and leaves below EDW, 36 degrees   Assessment/Plan: 1. Hypotension - chronic, BP most recently 109/78; Midodrine 10 mg tid PRN, holding for SBP > 85 per primary; Unable to afford as outpatient. 2. ESRD - HD on TTS @ [facility].  Next HD tomorrow. 3. Headache - CT head negative; slightly better. 4. HIV - Not taking outpatient meds, ID/SW working to obtain antiviral meds through the state AIDS drug assistance program. 5. Chronic diarrhea - workup per primary, no  diarrhea recently; stool culture pending. 6. Anemia - Hgb 11.1 on Aranesp 60 mcg.  Iron studies with next HD. 7. Secondary hyperparathyroidism - Ca 8.6 (9.6 corrected), P 5.1, Hectorol 1 mcg, Tums with meals. 8. Nutrition - Alb 2.7, renal diet and vitamin. 9. DM Type 2 - on insulin per primary     LOS: 6 days   LYLES,CHARLES 06/22/2012,7:42 AM  Patient seen and examined.  Agree with assessment and plan as above. Kelly Splinter  MD [phone] pgr    [phone] cell 06/22/2012, 11:45 AM

## 2012-06-22 NOTE — Telephone Encounter (Signed)
HIV medications hand written on prescription.  Dr. [name] signed prescription and ADAP application.  B. Phylliss Bob to continue with processing ADAP application.

## 2012-06-23 LAB — IRON AND TIBC
Saturation Ratios: 59 % — ABNORMAL HIGH (ref 20–55)
TIBC: 227 ug/dL (ref 215–435)

## 2012-06-23 LAB — GLUCOSE, CAPILLARY
Glucose-Capillary: 193 mg/dL — ABNORMAL HIGH (ref 70–99)
Glucose-Capillary: 154 mg/dL — ABNORMAL HIGH (ref 70–99)

## 2012-06-23 LAB — FECAL LACTOFERRIN, QUANT: Fecal Lactoferrin: NEGATIVE

## 2012-06-23 LAB — CBC
HCT: 29.9 % — ABNORMAL LOW (ref 39.0–52.0)
MCH: 31.2 pg (ref 26.0–34.0)
MCV: 90.6 fL (ref 78.0–100.0)
Platelets: 162 10*3/uL (ref 150–400)
RDW: 13 % (ref 11.5–15.5)

## 2012-06-23 LAB — RENAL FUNCTION PANEL
BUN: 40 mg/dL — ABNORMAL HIGH (ref 6–23)
CO2: 23 mEq/L (ref 19–32)
Calcium: 8.3 mg/dL — ABNORMAL LOW (ref 8.4–10.5)
Creatinine, Ser: 10.28 mg/dL — ABNORMAL HIGH (ref 0.50–1.35)
GFR calc non Af Amer: 5 mL/min — ABNORMAL LOW (ref >90)

## 2012-06-23 MED ORDER — SODIUM CHLORIDE 0.9 % IV BOLUS (SEPSIS): 1000.0000 mL | Freq: Once | INTRAVENOUS | Status: DC

## 2012-06-23 MED ORDER — MIDODRINE HCL 5 MG PO TABS
10.0000 mg | ORAL_TABLET | Freq: Three times a day (TID) | ORAL | Status: DC
  Administered 2012-06-23 – 2012-06-24 (×5): 10 mg via ORAL
  Filled 2012-06-23 (×7): qty 2

## 2012-06-23 MED ORDER — MIDODRINE HCL 5 MG PO TABS
ORAL_TABLET | ORAL | Status: AC
  Administered 2012-06-23: 10 mg via ORAL
  Filled 2012-06-23: qty 2

## 2012-06-23 MED ORDER — DOXERCALCIFEROL 4 MCG/2ML IV SOLN
INTRAVENOUS | Status: AC
  Administered 2012-06-23: 1 ug via INTRAVENOUS
  Filled 2012-06-23: qty 2

## 2012-06-23 NOTE — Progress Notes (Signed)
Hand wrote HIV rxes for Dr. [name] to sign and attached to ADAP application.  Application given to B. Phylliss Bob to process.

## 2012-06-23 NOTE — Procedures (Signed)
I was present at this dialysis session. I have reviewed the session itself and made appropriate changes.   Kelly Splinter, MD [employer] 06/23/2012, 10:31 AM

## 2012-06-23 NOTE — Progress Notes (Addendum)
Shift event: RN paged NP 2/2 pt falling at sink. Apparently, telemetry box fell and when staff arrived in room, pt was on knees at sink. He did not lose consciousness nor hit his head. BP 58/26 but this was while OOB. Pt had to go to bathroom for his chronic diarrhea issue. I reviewed the chart and pt has chronic hypotension on Midodrine, but unable to afford, so hasn't been taking at home. His hypotension is thought to be 2/2 ESRD and chronic diarrhea which has been worked up. He has had a neg CT head since this admission. Pt was put back to bed with bed alarm on. 1000cc NS bolus ordered and RN to keep me posted on BP.  I changed Midodrine order to TID [facility] AT[facility] (not prn). I reviewed the MAR and it appears he hasn't received the Midodrine at some point during each day, so likely cause of BP not being supported.  Will report incident to oncoming attending in case further testing is needed. It seems diagnosis is pretty clear for the hypotension.   Will cont to follow. Clance Boll, NP [facility] BP up to the 90s with bolus. KJKG, NP

## 2012-06-23 NOTE — Progress Notes (Signed)
[facility] PROGRESS NOTE  Alvin Daniels [id number] DOB: April 07, 1971 DOA: 06/16/2012 PCP: Default, Provider, MD   Brief narrative:  42 year old man with past medical history of HIV, hepatitis C, end-stage renal disease on HD, orthostatic hypotension treated with midodrine, and chronic diarrhea (C. difficile PCR studies -07/15/2011. No ova or parasites seen) who was admitted on 06/17/2012 with hypotension in the setting of chronic diarrhea, untreated HIV, and inability to procure and take midodrine.   Assessment/Plan:  *Hypotension:  appears to have low BP at baseline and worsened by ESRD and diarrhea  EF of 55% in 1/14, Random cortisol 6.9  -Exacerbated by inability to afford chronic medications to treat this (Midodrine).  -Chronic diarrhea likely contributory.  -increased midodrine to 10mg  TID, unable to afford this and case management attempting to d/w administration, since this will be a recurrent long standing problem . D/w SW today. Administration working on  options for medications. Will know by tomorrow if there is any option to provide him with medications. Otherwise he will be discharged home with follow up at HD and ID clinic. He has been needing lomotil pre and during HD for his diarrhea and IV saline boluses during HD. -Low BP overnight which improved with IV NS  Chronic diarrhea  - stool for C&S, -ve C. difficile PCR all negative. Microsporidia, giardia, negative.  -Status post upper endoscopy and colonoscopy 07/17/2011 with gastric pathology negative for H. pylori, esophageal biopsy positive for some inflammation, and colonic mucosa biopsy negative.  -continue imodium pre and mid HD as scheduled.   Metabolic bone disease  -Intact PTH 77 on 06/11/2012. Continue Hectorol.   Headache  -CT of head done 06/16/2012 and normal  -improved - ID consult noted  Neck pain  May be related to above-mentioned headache - continue when necessary ibuprofen   Normocytic anemia   -Anemia secondary to end-stage renal disease. Hemoglobin stable. No current indication for transfusion.   Severe protein calorie malnutrition  -. Continue Nepro shakes.   HIV INFECTION  -Last CD4 count is 300  -ID, Dr. [name] attempting to get him his HIV medicines free through his clinic.  ID signed off , outpt ID follow up  RENAL FAILURE, END STAGE, secondary to diabetic nephropathy, K 5.2  -Gets dialysis at the [facility] kidney Center: Tu/Th/Sat.  -cleared for d/c per renal.   DM  Continue insulin sliding scale.   Consults:  RENAL  ID  dispo  home tomorrow after d/w SW regarding any options for prescription medications that can be provided to him  (midodrine, imodium) . Given lack of insurance and inability to buy medications it has been a big challenge to control his  hypotension      HPI/Subjective: Noted for fall overnight and low BP prior to this. Given IV bolus. Has been requiring IV fluids with HD. denies any symptoms when seen during HD.   Objective: Filed Vitals:   06/23/12 0958 06/23/12 1030 06/23/12 1055 06/23/12 1124  BP: 106/68 106/67 102/65 116/72  Pulse: 75 72 76 82  Temp:    98.3 F (36.8 C)  TempSrc:    Oral  Resp:    14  Height:      Weight:    52.4 kg (115 lb 8.3 oz)  SpO2:    99%    Intake/Output Summary (Last 24 hours) at 06/23/12 1512 Last data filed at 06/23/12 1055  Gross per 24 hour  Intake    900 ml  Output   -632 ml  Net  1532 ml   Filed Weights   06/22/12 2145 06/23/12 0720 06/23/12 1124  Weight: 51.529 kg (113 lb 9.6 oz) 52 kg (114 lb 10.2 oz) 52.4 kg (115 lb 8.3 oz)    Exam:   General:  Middle aged male in NAD  HEENT: no pallor, moist oral mucosa  Cardiovascular: NS1&S2, no murmurs  Respiratory: clear b/l, no added sounds  Abdomen: soft, NT, ND, Bs+  EXT: warm, no edema, has HD catheter   CNS: AAOX3  Data Reviewed: Basic Metabolic Panel:  Recent Labs Lab 06/17/12 0643 06/20/12 0546  06/20/12 2126 06/22/12 1230 06/23/12 0747  NA 135 133* 131* 136 136  K 3.9 4.2 5.2* 4.1 4.5  CL 102 96 96 95* 100  CO2 23 26 21 29 23   GLUCOSE 147* 128* 169* 150* 134*  BUN 27* 37* 48* 28* 40*  CREATININE 7.30* 8.03* 10.18* 8.13* 10.28*  CALCIUM 8.5 8.5 8.6 8.7 8.3*  PHOS  --   --  5.1*  --  5.1*   Liver Function Tests:  Recent Labs Lab 06/20/12 2126 06/23/12 0747  ALBUMIN 2.7* 2.8*   No results found for this basename: LIPASE, AMYLASE,  in the last 168 hours No results found for this basename: AMMONIA,  in the last 168 hours CBC:  Recent Labs Lab 06/16/12 1836 06/17/12 0056 06/17/12 0643 06/20/12 2125 06/23/12 0737  WBC 6.5 5.9 6.1 6.1 5.6  HGB 11.4* 10.2* 10.6* 11.1* 10.3*  HCT 34.2* 30.1* 31.1* 31.8* 29.9*  MCV 92.9 92.9 92.0 90.9 90.6  PLT 196 167 176 190 162   Cardiac Enzymes:  Recent Labs Lab 06/17/12 0100 06/17/12 0643 06/17/12 1400  TROPONINI <0.30 <0.30 <0.30   BNP (last 3 results) No results found for this basename: PROBNP,  in the last 8760 hours CBG:  Recent Labs Lab 06/22/12 1132 06/22/12 1628 06/22/12 2144 06/23/12 0217 06/23/12 1140  GLUCAP 144* 166* 151* 193* 103*    Recent Results (from the past 240 hour(s))  STOOL CULTURE     Status: None   Collection Time    06/20/12 11:51 AM      Result Value Range Status   Specimen Description STOOL   Final   Special Requests NONE   Final   Culture NO SUSPICIOUS COLONIES, CONTINUING TO HOLD   Final   Report Status PENDING   Incomplete  CLOSTRIDIUM DIFFICILE BY PCR     Status: None   Collection Time    06/20/12 11:51 AM      Result Value Range Status   C difficile by pcr NEGATIVE  NEGATIVE Final  CRYPTOSPORIDIUM SMEAR, FECAL     Status: None   Collection Time    06/20/12 11:51 AM      Result Value Range Status   Specimen Description STOOL   Final   Special Requests NONE   Final   Cryptosporidium Smear. NO Cryptosporidium Cyclospora or Isospora seen.   Final   Report Status  06/22/2012 FINAL   Final     Studies: No results found.  Scheduled Meds: . calcium carbonate  1 tablet Oral TID WC  . darbepoetin (ARANESP) injection - DIALYSIS  60 mcg Intravenous Q Sat-HD  . darunavir  800 mg Oral Q breakfast  . diphenoxylate-atropine  1 tablet Oral QID  . doxercalciferol  1 mcg Intravenous Q T,Th,Sa-HD  . emtricitabine  200 mg Oral 2 times weekly  . heparin  5,000 Units Subcutaneous Q8H  . insulin aspart  0-9 Units Subcutaneous TID WC  .  midodrine  10 mg Oral TID WC  . multivitamin  1 tablet Oral Daily  . ritonavir  100 mg Oral Q breakfast  . sodium chloride  1,000 mL Intravenous Once  . sodium chloride  3 mL Intravenous Q12H  . tenofovir  300 mg Oral Weekly   Continuous Infusions:     Time spent: 25 minutes    Kathyrn Warmuth  [facility] Pager [phone] 8PM-8AM, please contact night-coverage at [email or web address], password [facility] 06/23/2012, 3:12 PM  LOS: 7 days

## 2012-06-23 NOTE — Progress Notes (Addendum)
Subjective:   Headache and dizziness much better, but received 1 L of NS last night for SBP in 50s around 2 AM, at which time he was asymptomatic. Reports diarrhea every time after he eats within 15 min or so  Objective: Vital signs in last 24 hours: Temp:  [98 F (36.7 C)-98.6 F (37 C)] 98.2 F (36.8 C) (02/11 0518) Pulse Rate:  [74-95] 76 (02/11 0518) Resp:  [18-20] 18 (02/11 0518) BP: (58-101)/(36-64) 94/62 mmHg (02/11 0518) SpO2:  [98 %-99 %] 98 % (02/11 0518) Weight:  [51.529 kg (113 lb 9.6 oz)] 51.529 kg (113 lb 9.6 oz) (02/10 2145) Weight change: -0.816 kg (-1 lb 12.8 oz)  Intake/Output from previous day: 02/10 0701 - 02/11 0700 In: 240 [P.O.:240] Out: 1 [Stool:1]   EXAM: General appearance:  Alert, in no apparent distress Resp:  CTA without rales, rhonchi, or wheezes Cardio:   RRR without murmur or rub GI:  + BS, soft and nontender Extremities:  No edema Access:  AVF @ LFA with + bruit  Lab Results:  Recent Labs  06/20/12 2125  WBC 6.1  HGB 11.1*  HCT 31.8*  PLT 190   BMET:  Recent Labs  06/20/12 2126 06/22/12 1230  NA 131* 136  K 5.2* 4.1  CL 96 95*  CO2 21 29  GLUCOSE 169* 150*  BUN 48* 28*  CREATININE 10.18* 8.13*  CALCIUM 8.6 8.7  ALBUMIN 2.7*  --    No results found for this basename: PTH,  in the last 72 hours Iron Studies: No results found for this basename: IRON, TIBC, TRANSFERRIN, FERRITIN,  in the last 72 hours  Dialysis Orders: [facility]: [facility] 160 4h 400/A 1.5 2K 2.25 Ca left upper AVF var Na, standard heparin hectorol 1 EDW is 54.5, but consistently comes in below and leaves below EDW, 36 degrees   Assessment/Plan: 1. Hypotension - chronic, BP most recently 94/62, but received 1 L of NS last night when BP fell to 58/36; Midodrine 10 mg tid PRN, holding for SBP > 85 per primary; unable to afford as outpatient.  No fluid off with HD, pt is prob volume depleted chronically.  2. ESRD - HD on TTS @ [facility]; K 4.1.  HD pending  today. 3. Headache - CT head negative; better. 4. HIV - Not taking outpatient meds, ID/SW working to obtain antiviral meds through the state AIDS drug assistance program. 5. Chronic diarrhea - workup per primary, no diarrhea recently; stool culture pending.Cdif and O&P were negative 6. Anemia - Hgb 11.1 on Aranesp 60 mcg. Iron studies with next HD. 7. Secondary hyperparathyroidism - Ca 8.6 (9.6 corrected), P 5.1, Hectorol 1 mcg, Tums with meals. 8. Nutrition - Alb 2.7, renal diet and vitamin. 9. DM Type 2 - on insulin per primary 10. Dispo- no further suggestions, disposition per primary    LOS: 7 days   LYLES,CHARLES 06/23/2012,7:15 AM  Patient seen and examined.  I agree with plan as above with additions as indicated.  According to staff from [facility] unit where pt dialyzes, patient is always volume depleted related to chronic diarrhea. Fluid is never removed with HD and often he'll require saline during dialysis for low BP's in the 60's, up to 0.8-1.5 liters. This has been going on for at least a year. He gets imodium pre and mid treatment, scheduled. He was admitted for dizziness and low BP and unless further work-up is to be done, he can probably be discharged. Will d/w primary team.  Kelly Splinter  MD [phone]    [phone] cell 06/23/2012, 10:35 AM

## 2012-06-24 DIAGNOSIS — N189 Chronic kidney disease, unspecified: Secondary | ICD-10-CM

## 2012-06-24 DIAGNOSIS — D631 Anemia in chronic kidney disease: Secondary | ICD-10-CM

## 2012-06-24 LAB — GLUCOSE, CAPILLARY: Glucose-Capillary: 132 mg/dL — ABNORMAL HIGH (ref 70–99)

## 2012-06-24 LAB — STOOL CULTURE

## 2012-06-24 LAB — OVA AND PARASITE EXAMINATION

## 2012-06-24 MED ORDER — RENA-VITE PO TABS: 1.0000 | ORAL_TABLET | Freq: Every day | ORAL | Status: DC

## 2012-06-24 MED ORDER — CALCIUM CARBONATE ANTACID 500 MG PO CHEW: 1.0000 | CHEWABLE_TABLET | Freq: Three times a day (TID) | ORAL | Status: DC

## 2012-06-24 MED ORDER — MIDODRINE HCL 10 MG PO TABS: 10.0000 mg | ORAL_TABLET | Freq: Three times a day (TID) | ORAL | Status: DC

## 2012-06-24 NOTE — Progress Notes (Signed)
Pt discharged to home after visit summary reviewed and pt capable of re verbalizing medications and follow up appointments. Pt remains stable. No signs and symptoms of distress. Educated to return to ER in the event of SOB, dizziness, chest pain, or fainting. It was explained to pt how to obtain his medication from [facility] pharmacy with the voucher given to him by the case manager through the interpreter. Pt stated he understood and did not have any questions. Jobe Igo, RN

## 2012-06-24 NOTE — Clinical Social Work Note (Signed)
Patient is discharging home today and will transport home via ambulance. He will be transported to the outpatient pharmacy first then home.  Jossette Zirbel Givens, MSW, LCSW [phone]

## 2012-06-24 NOTE — Care Management Note (Signed)
   CARE MANAGEMENT NOTE 06/24/2012  Patient:  Alvin Daniels, Alvin Daniels   Account Number:  [id number]  Date Initiated:  06/17/2012  Documentation initiated by:  Deliliah Spranger  Subjective/Objective Assessment:   Referral for assistance with medications.     Action/Plan:   Noted that pt has insurance with [place] [employer], therefore has copay of $3-4 per prescription. Not eligible for further assistance.  06/19/2012 see notes below re medications.   Anticipated DC Date:  06/20/2012   Anticipated DC Plan:  [place] [facility]  Medication Assistance      Choice offered to / List presented to:             Status of service:  In process, will continue to follow Medicare Important Message given?   (If response is "NO", the following Medicare IM given date fields will be blank) Date Medicare IM given:   Date Additional Medicare IM given:    Discharge Disposition:  HOME/SELF CARE  Per UR Regulation:    If discussed at Long Length of Stay Meetings, dates discussed:    Comments:   06/24/2012 Pt will be provided with Midodrine thru [facility] program for 30 day supply. contact with local [facility] services to attempt to find services for this pt to assist with medication purchase. Jasmine Pang RN MPH [phone]   06/19/2012 Pt unable to afford Midodrine, repeated hospitalizations for low Bp and dizziness. Contacted IM clinic and SW sent to pt room to setup for HIV meds however unable to provide Midodrine. Contacted Midodrine manufacturer, [employer], and spoke with two different representatives who stated that the company has no assistance program for Midodrine. E-mail also sent to the [facility] asking for assistance. Contacted Medication Assistance Program at [place], they are unable to assist this pt as the manufacture has no assistance program therefore they are unable to obtain the medication. Could attempt to use the [facility] program for  30 day supply. This would prevent the pt from receiving anyother medications from that program for the next year. Jasmine Pang RN MPH Case Manager [phone]

## 2012-06-24 NOTE — Progress Notes (Signed)
Subjective:  No current complaints, headache much better, no dizziness; denies any diarrhea since yesterday.  Objective: Vital signs in last 24 hours: Temp:  [97.2 F (36.2 C)-98.9 F (37.2 C)] 98.9 F (37.2 C) (02/12 0617) Pulse Rate:  [72-86] 78 (02/12 0617) Resp:  [14-20] 17 (02/12 0617) BP: (72-123)/(39-76) 80/50 mmHg (02/12 0617) SpO2:  [96 %-100 %] 100 % (02/12 0617) Weight:  [50.9 kg (112 lb 3.4 oz)-52.4 kg (115 lb 8.3 oz)] 50.9 kg (112 lb 3.4 oz) (02/11 2225) Weight change: 0.471 kg (1 lb 0.6 oz)  Intake/Output from previous day: 02/11 0701 - 02/12 0700 In: 600 [P.O.:600] Out: -633    EXAM: General appearance:  Alert, in no apparent distress Resp:  CTA without rales, rhonchi, or wheezes Cardio:  RRR without murmur, no rub GI: + BS, soft and nontender Extremities:  No edema Access:  AVF @ LFA with + bruit  Lab Results:  Recent Labs  06/23/12 0737  WBC 5.6  HGB 10.3*  HCT 29.9*  PLT 162   BMET:  Recent Labs  06/22/12 1230 06/23/12 0747  NA 136 136  K 4.1 4.5  CL 95* 100  CO2 29 23  GLUCOSE 150* 134*  BUN 28* 40*  CREATININE 8.13* 10.28*  CALCIUM 8.7 8.3*  ALBUMIN  --  2.8*   No results found for this basename: PTH,  in the last 72 hours Iron Studies:  Recent Labs  06/23/12 0737  IRON 133  TIBC 227  FERRITIN 1408*   Dialysis Orders: [facility] 160 4h 400/A 1.5 2K 2.25 Ca left upper AVF var Na, standard heparin hectorol 1 EDW is 54.5, but consistently comes in below and leaves below EDW, 36 degrees   Assessment/Plan: 1. Hypotension - chronic, BP most recently 80/50, received 1 L of NS yesterday AM when BP fell to 58/36; likely chronic volume depletion; Midodrine 10 mg tid PRN, holding for SBP > 85 per primary; unable to afford as outpatient. 2. ESRD - HD on TTS [facility]; K 4.5. HD tomorrow. 3. Headache - CT head negative; better. 4. HIV - Not taking outpatient meds, ID/SW working to obtain antiviral meds through the state AIDS drug  assistance program. 5. Chronic diarrhea - workup per primary, no diarrhea recently; C diff and O & P negative, stool culture pending. 6. Anemia - Hgb 10.3 on Aranesp 60 mcg. Iron studies with next HD. 7. Secondary hyperparathyroidism - Ca 8.3 (9.3 corrected), P 5.1, Hectorol 1 mcg, Tums with meals. 8. Nutrition - Alb 2.7, renal diet and vitamin. 9. DM Type 2 - on insulin per primary 10. Dispo - no further suggestions, disposition per primary.     LOS: 8 days   LYLES,CHARLES 06/24/2012,6:56 AM  Patient seen and examined.  Agree with assessment and plan as above. Kelly Splinter  MD [phone] pgr    [phone] cell 06/24/2012, 12:17 PM

## 2012-06-24 NOTE — Progress Notes (Signed)
Instructed patient to call for  Assistance to bathroom,  Bed alarm placed.  When  Reentered the room patient  Reset  /turned off  Bed alarm.

## 2012-06-24 NOTE — Discharge Summary (Signed)
Physician Discharge Summary  Patient ID: Ayce Bombara MRN: [id number] DOB/AGE: 1971-01-28 42 y.o.  Admit date: 06/16/2012 Discharge date: 06/24/2012  Primary Care Physician:  Default, Provider, MD   Discharge Diagnoses:    Principal Problem:   Hypotension Active Problems:   HIV INFECTION   DIABETES MELLITUS, TYPE II, UNCONTROLLED, W/RENAL COMPS   RENAL FAILURE, END STAGE   Hepatitis C carrier   Polyneuropathy in diabetes(357.2)   Syncope and collapse   Chronic diarrhea   Severe protein-calorie malnutrition   Normocytic anemia   Headache   Metabolic bone disease   Anemia of chronic renal failure      Medication List    TAKE these medications       calcium carbonate 500 MG chewable tablet  Commonly known as:  TUMS - dosed in mg elemental calcium  Chew 1 tablet (200 mg of elemental calcium total) by mouth 3 (three) times daily with meals.     midodrine 10 MG tablet  Commonly known as:  PROAMATINE  Take 1 tablet (10 mg total) by mouth 3 (three) times daily with meals.     multivitamin Tabs tablet  Take 1 tablet by mouth daily.         Disposition and Follow-up:  Will be discharged home today in stable and improved condition. Will follow up tomorrow with his dialysis center for his scheduled HD.  Consults:  Nephrology, Dr. Jonnie Finner.   Significant Diagnostic Studies:  No results found.  Brief H and P: For complete details please refer to admission H and P, but in brief patient is a 42 y.o. male who is sent over to the ED from Dr. Algis Downs office today after he was noted to be hypotensive and dizzy following his dialysis this morning. The patient has a long history of the same dating back at least 2 years (with similar admissions in 2012) and most recently 2 weeks ago. Unfortunately his symptoms are not helped by the fact that he apparently has not received any of his meds (including the midodrine he is supposed to be on to treat this).  In the ED he was  noted to be hypotensive as low as the 60s SBP, this improved to 80s SBP lying down with 1L of NS but he still drops BP when trying to stand up. We were asked to admit him for further evaluation and management.     Hospital Course:  Principal Problem:   Hypotension Active Problems:   HIV INFECTION   DIABETES MELLITUS, TYPE II, UNCONTROLLED, W/RENAL COMPS   RENAL FAILURE, END STAGE   Hepatitis C carrier   Polyneuropathy in diabetes(357.2)   Syncope and collapse   Chronic diarrhea   Severe protein-calorie malnutrition   Normocytic anemia   Headache   Metabolic bone disease   Anemia of chronic renal failure     Hypotension:  -Appears to have low BP at baseline and worsened by ESRD and diarrhea   -Exacerbated by inability to afford chronic medications to treat this (Midodrine).  -Chronic diarrhea likely contributory.  -on midodrine. CM has been able to provide this to him via the [facility] program.  Chronic diarrhea  - stool for C&S, -ve C. difficile PCR all negative. Microsporidia, giardia, negative.  -Status post upper endoscopy and colonoscopy 07/17/2011 with gastric pathology negative for H. pylori, esophageal biopsy positive for some inflammation, and colonic mucosa biopsy negative.  -continue imodium pre and mid HD as scheduled.   Metabolic bone disease  -Intact PTH 77 on 06/11/2012.  Continue Hectorol.   Headache  -CT of head done 06/16/2012 and normal  -improved   Normocytic anemia  -Anemia secondary to end-stage renal disease. Hemoglobin stable. No current indication for transfusion.   Severe protein calorie malnutrition  -. Continue Nepro shakes.   HIV INFECTION  -Last CD4 count is 300  -ID, Dr. [name] attempting to get him his HIV medicines free through his clinic.  ID signed off , outpt ID follow up   RENAL FAILURE, END STAGE, secondary to diabetic nephropathy, K 5.2  -Gets dialysis at the [facility] kidney Center: Tu/Th/Sat.  -cleared for d/c per  renal.   DM  Continue insulin sliding scale.      Time spent on Discharge: Greater than 30 minutes.  SignedLelon Frohlich [facility] Pager: [phone] 06/24/2012, 4:08 PM

## 2012-06-24 NOTE — Progress Notes (Signed)
NUTRITION FOLLOW UP  Intervention:   1. If pt has poor intake, recommend Resource Breeze po daily, each supplement provides 250 kcal and 9 grams of protein. 2. Consider probiotic if diarrhea persists. 3. RD to continue to follow nutrition care plan.  Nutrition Dx:   Increased nutrient needs related to HIV and ESRD as evidenced by estimated needs. Ongoing.  Goal:   Pt to meet >/= 90% of their estimated nutrition needs. Met.  Monitor:   weight trends, lab trends, I/O's, PO intake, supplement tolerance  Assessment:   Noted per nephrology, EDW is 54.5 kg, however pt consistently is below dry weight and dry weight has not been lowered. Pt continues to complain of diarrhea after every time he eats. C diff negative. Stool sample pending. Started on lomotil on 2/10.  Eating mostly 75 - 100%. Nepro Shakes discontinued by Renal PA.  Height: Ht Readings from Last 1 Encounters:  06/21/12 5\' 2"  (1.575 m)    Weight Status:   115 lb 8.3 oz s/p HD on 2/11  Re-estimated needs:  Kcal: 1600 - 1800 Protein: 65 - 75 grams Fluid: 1.2 liters  Skin: open leg lesion  Diet Order: Renal 80-90; 1200 ml fluid restriction   Intake/Output Summary (Last 24 hours) at 06/24/12 1236 Last data filed at 06/23/12 1700  Gross per 24 hour  Intake    600 ml  Output      0 ml  Net    600 ml    Last BM: 2/11   Labs:   Recent Labs Lab 06/20/12 2126 06/22/12 1230 06/23/12 0747  NA 131* 136 136  K 5.2* 4.1 4.5  CL 96 95* 100  CO2 21 29 23   BUN 48* 28* 40*  CREATININE 10.18* 8.13* 10.28*  CALCIUM 8.6 8.7 8.3*  PHOS 5.1*  --  5.1*  GLUCOSE 169* 150* 134*    CBG (last 3)   Recent Labs  06/23/12 2201 06/24/12 0750 06/24/12 1156  GLUCAP 154* 132* 138*    Scheduled Meds: . calcium carbonate  1 tablet Oral TID WC  . darbepoetin (ARANESP) injection - DIALYSIS  60 mcg Intravenous Q Sat-HD  . darunavir  800 mg Oral Q breakfast  . diphenoxylate-atropine  1 tablet Oral QID  .  doxercalciferol  1 mcg Intravenous Q T,Th,Sa-HD  . emtricitabine  200 mg Oral 2 times weekly  . heparin  5,000 Units Subcutaneous Q8H  . insulin aspart  0-9 Units Subcutaneous TID WC  . midodrine  10 mg Oral TID WC  . multivitamin  1 tablet Oral Daily  . ritonavir  100 mg Oral Q breakfast  . sodium chloride  1,000 mL Intravenous Once  . sodium chloride  3 mL Intravenous Q12H  . tenofovir  300 mg Oral Weekly    Continuous Infusions: none  Inda Coke MS, RD, LDN Pager: [phone] After-hours pager: [phone]

## 2012-06-25 ENCOUNTER — Observation Stay (HOSPITAL_COMMUNITY)
Admission: EM | Admit: 2012-06-25 | Discharge: 2012-06-27 | Disposition: A | Discharge status: Home / Self Care | Payer: Medicaid Other | Attending: Internal Medicine | Admitting: Internal Medicine

## 2012-06-25 ENCOUNTER — Emergency Department (HOSPITAL_COMMUNITY): Payer: Medicaid Other

## 2012-06-25 ENCOUNTER — Encounter (HOSPITAL_COMMUNITY): Payer: Self-pay | Admitting: Family Medicine

## 2012-06-25 DIAGNOSIS — N058 Unspecified nephritic syndrome with other morphologic changes: Secondary | ICD-10-CM | POA: Insufficient documentation

## 2012-06-25 DIAGNOSIS — Z9119 Patient's noncompliance with other medical treatment and regimen: Secondary | ICD-10-CM | POA: Insufficient documentation

## 2012-06-25 DIAGNOSIS — N186 End stage renal disease: Secondary | ICD-10-CM | POA: Diagnosis present

## 2012-06-25 DIAGNOSIS — D649 Anemia, unspecified: Secondary | ICD-10-CM

## 2012-06-25 DIAGNOSIS — E1142 Type 2 diabetes mellitus with diabetic polyneuropathy: Secondary | ICD-10-CM

## 2012-06-25 DIAGNOSIS — IMO0002 Reserved for concepts with insufficient information to code with codable children: Secondary | ICD-10-CM | POA: Insufficient documentation

## 2012-06-25 DIAGNOSIS — I120 Hypertensive chronic kidney disease with stage 5 chronic kidney disease or end stage renal disease: Secondary | ICD-10-CM | POA: Insufficient documentation

## 2012-06-25 DIAGNOSIS — M9080 Osteopathy in diseases classified elsewhere, unspecified site: Secondary | ICD-10-CM

## 2012-06-25 DIAGNOSIS — R55 Syncope and collapse: Secondary | ICD-10-CM

## 2012-06-25 DIAGNOSIS — B20 Human immunodeficiency virus [HIV] disease: Secondary | ICD-10-CM

## 2012-06-25 DIAGNOSIS — K529 Noninfective gastroenteritis and colitis, unspecified: Secondary | ICD-10-CM

## 2012-06-25 DIAGNOSIS — Z21 Asymptomatic human immunodeficiency virus [HIV] infection status: Secondary | ICD-10-CM | POA: Insufficient documentation

## 2012-06-25 DIAGNOSIS — E1122 Type 2 diabetes mellitus with diabetic chronic kidney disease: Secondary | ICD-10-CM | POA: Diagnosis present

## 2012-06-25 DIAGNOSIS — Z91199 Patient's noncompliance with other medical treatment and regimen due to unspecified reason: Secondary | ICD-10-CM | POA: Insufficient documentation

## 2012-06-25 DIAGNOSIS — B1920 Unspecified viral hepatitis C without hepatic coma: Secondary | ICD-10-CM | POA: Insufficient documentation

## 2012-06-25 DIAGNOSIS — E889 Metabolic disorder, unspecified: Secondary | ICD-10-CM

## 2012-06-25 DIAGNOSIS — N039 Chronic nephritic syndrome with unspecified morphologic changes: Secondary | ICD-10-CM | POA: Insufficient documentation

## 2012-06-25 DIAGNOSIS — Z79899 Other long term (current) drug therapy: Secondary | ICD-10-CM | POA: Insufficient documentation

## 2012-06-25 DIAGNOSIS — I959 Hypotension, unspecified: Secondary | ICD-10-CM | POA: Diagnosis present

## 2012-06-25 DIAGNOSIS — E1165 Type 2 diabetes mellitus with hyperglycemia: Secondary | ICD-10-CM | POA: Insufficient documentation

## 2012-06-25 DIAGNOSIS — B182 Chronic viral hepatitis C: Secondary | ICD-10-CM

## 2012-06-25 DIAGNOSIS — R079 Chest pain, unspecified: Principal | ICD-10-CM | POA: Insufficient documentation

## 2012-06-25 DIAGNOSIS — E119 Type 2 diabetes mellitus without complications: Secondary | ICD-10-CM | POA: Diagnosis present

## 2012-06-25 DIAGNOSIS — E43 Unspecified severe protein-calorie malnutrition: Secondary | ICD-10-CM | POA: Diagnosis present

## 2012-06-25 DIAGNOSIS — Z992 Dependence on renal dialysis: Secondary | ICD-10-CM | POA: Insufficient documentation

## 2012-06-25 DIAGNOSIS — E1129 Type 2 diabetes mellitus with other diabetic kidney complication: Secondary | ICD-10-CM

## 2012-06-25 DIAGNOSIS — N189 Chronic kidney disease, unspecified: Secondary | ICD-10-CM | POA: Diagnosis present

## 2012-06-25 DIAGNOSIS — R197 Diarrhea, unspecified: Secondary | ICD-10-CM | POA: Insufficient documentation

## 2012-06-25 DIAGNOSIS — R51 Headache: Secondary | ICD-10-CM

## 2012-06-25 DIAGNOSIS — D631 Anemia in chronic kidney disease: Secondary | ICD-10-CM | POA: Diagnosis present

## 2012-06-25 LAB — POCT I-STAT TROPONIN I: Troponin i, poc: 0 ng/mL (ref 0.00–0.08)

## 2012-06-25 MED ORDER — SODIUM CHLORIDE 0.9 % IV BOLUS (SEPSIS)
250.0000 mL | Freq: Once | INTRAVENOUS | Status: AC
  Administered 2012-06-25: 250 mL via INTRAVENOUS

## 2012-06-25 NOTE — ED Notes (Signed)
Pt states does not produce urine. EDP aware.

## 2012-06-25 NOTE — ED Provider Notes (Addendum)
History     CSN: [id number]  Arrival date & time 06/25/12  2245   First MD Initiated Contact with Patient 06/25/12 2303      Chief Complaint  Patient presents with  . Chest Pain  . Weakness    (Consider location/radiation/quality/duration/timing/severity/associated sxs/prior treatment) Patient is a 42 y.o. male presenting with chest pain and weakness. The history is provided by the patient.  Chest Pain Pain location:  Substernal area Pain quality: aching   Pain radiates to:  Does not radiate Pain radiates to the back: no   Pain severity:  Moderate Onset quality:  Gradual Duration:  6 hours Timing:  Constant Chronicity:  New Relieved by:  Nothing Worsened by:  Nothing tried Ineffective treatments:  None tried Associated symptoms: weakness   Weakness Associated symptoms include chest pain.    Past Medical History  Diagnosis Date  . ESRD (end stage renal disease) on dialysis   . Hepatitis C   . Dialysis patient   . MRSA infection   . Pancreatitis   . Chronic diarrhea   . DM 06/27/2010    Annotation: uncontrolled Qualifier: Diagnosis of  By: Nickola Major CMA ( AAMA), Geni Bers    . HIV INFECTION 06/27/2010    Qualifier: Diagnosis of  By: Nickola Major CMA ( [place]), Geni Bers    . Hypotension 06/02/2012  . Metabolic bone disease [id number]  . Normocytic anemia 06/17/2012  . Severe protein-calorie malnutrition 06/17/2012    Past Surgical History  Procedure Laterality Date  . No past surgeries      Family History  Problem Relation Age of Onset  . Diabetes Mother   . Diabetes Father     History  Substance Use Topics  . Smoking status: Never Smoker   . Smokeless tobacco: Never Used  . Alcohol Use: No      Review of Systems  Cardiovascular: Positive for chest pain.  Neurological: Positive for weakness.  All other systems reviewed and are negative.    Allergies  Review of patient's allergies indicates no known allergies.  Home Medications  No current  outpatient prescriptions on file.  BP 85/56  Pulse 88  Temp(Src) 97.5 F (36.4 C) (Oral)  Resp 12  SpO2 100%  Physical Exam  Vitals reviewed. Constitutional: He is oriented to person, place, and time. He appears well-developed and well-nourished.  HENT:  Head: Normocephalic and atraumatic.  Eyes: Conjunctivae are normal. Pupils are equal, round, and reactive to light.  Neck: Normal range of motion. Neck supple.  Cardiovascular: Normal rate, regular rhythm, normal heart sounds and intact distal pulses.   Pulmonary/Chest: Effort normal and breath sounds normal.  Abdominal: Soft. Bowel sounds are normal.  Musculoskeletal:  Left arm fistula with palpable thrill  Neurological: He is alert and oriented to person, place, and time.  Skin: Skin is warm and dry.  Psychiatric: He has a normal mood and affect. His behavior is normal. Judgment and thought content normal.    ED Course  Procedures (including critical care time)  Labs Reviewed  CLOSTRIDIUM DIFFICILE BY PCR  CBC  COMPREHENSIVE METABOLIC PANEL  URINALYSIS, ROUTINE W REFLEX MICROSCOPIC   No results found.   No diagnosis found.   Date: 06/25/2012  Rate: 86  Rhythm: normal sinus rhythm  QRS Axis: normal  Intervals: normal  ST/T Wave abnormalities: normal  Conduction Disutrbances: none  Narrative Interpretation: unremarkable     MDM  + chest pain, hypotension,  Missed dialysis.  Global weakness.  Will lab,  reassess  ce neg x1  hypotension improved.  Poorly compliant with midronone.  Discussed with hospitalist,  Will admit      Aaron Edelman, MD 06/25/12 [place], MD 06/26/12 [id number]

## 2012-06-25 NOTE — ED Notes (Signed)
Per EMS, pt is an ESRF pt who had dialysis on Tuesday and missed dialysis today r/t the weather. Pt reports becoming increasingly weak throughout the day with central chest pain and stomach pain. Pt has shunt in left forearm. Per EMS pt was carried from home to stretcher, pt unable to ambulate on scene. Pt is spanish speaking only, interpreter telephone used for assessments.

## 2012-06-26 ENCOUNTER — Observation Stay (HOSPITAL_COMMUNITY): Payer: Medicaid Other

## 2012-06-26 ENCOUNTER — Encounter (HOSPITAL_COMMUNITY): Payer: Self-pay | Admitting: Radiology

## 2012-06-26 DIAGNOSIS — D631 Anemia in chronic kidney disease: Secondary | ICD-10-CM

## 2012-06-26 DIAGNOSIS — R079 Chest pain, unspecified: Secondary | ICD-10-CM

## 2012-06-26 DIAGNOSIS — B20 Human immunodeficiency virus [HIV] disease: Secondary | ICD-10-CM

## 2012-06-26 DIAGNOSIS — R197 Diarrhea, unspecified: Secondary | ICD-10-CM

## 2012-06-26 DIAGNOSIS — N189 Chronic kidney disease, unspecified: Secondary | ICD-10-CM

## 2012-06-26 DIAGNOSIS — I959 Hypotension, unspecified: Secondary | ICD-10-CM

## 2012-06-26 DIAGNOSIS — N039 Chronic nephritic syndrome with unspecified morphologic changes: Secondary | ICD-10-CM

## 2012-06-26 LAB — BASIC METABOLIC PANEL
CO2: 16 mEq/L — ABNORMAL LOW (ref 19–32)
Chloride: 99 mEq/L (ref 96–112)
Creatinine, Ser: 13.1 mg/dL — ABNORMAL HIGH (ref 0.50–1.35)
Glucose, Bld: 110 mg/dL — ABNORMAL HIGH (ref 70–99)
Sodium: 134 mEq/L — ABNORMAL LOW (ref 135–145)

## 2012-06-26 LAB — COMPREHENSIVE METABOLIC PANEL
ALT: 75 U/L — ABNORMAL HIGH (ref 0–53)
Calcium: 9.6 mg/dL (ref 8.4–10.5)
GFR calc Af Amer: 5 mL/min — ABNORMAL LOW (ref >90)
Glucose, Bld: 166 mg/dL — ABNORMAL HIGH (ref 70–99)
Sodium: 137 mEq/L (ref 135–145)
Total Protein: 10.3 g/dL — ABNORMAL HIGH (ref 6.0–8.3)

## 2012-06-26 LAB — CBC
HCT: 37.2 % — ABNORMAL LOW (ref 39.0–52.0)
Hemoglobin: 13.5 g/dL (ref 13.0–17.0)
Hemoglobin: 12.7 g/dL — ABNORMAL LOW (ref 13.0–17.0)
MCH: 31.7 pg (ref 26.0–34.0)
MCHC: 35 g/dL (ref 30.0–36.0)
Platelets: 240 10*3/uL (ref 150–400)
RBC: 4.09 MIL/uL — ABNORMAL LOW (ref 4.22–5.81)
RDW: 13.3 % (ref 11.5–15.5)
WBC: 8.7 10*3/uL (ref 4.0–10.5)

## 2012-06-26 LAB — TROPONIN I
Troponin I: <0.3 ng/mL (ref <0.30)
Troponin I: <0.3 ng/mL (ref <0.30)

## 2012-06-26 LAB — GLUCOSE, CAPILLARY
Glucose-Capillary: 152 mg/dL — ABNORMAL HIGH (ref 70–99)
Glucose-Capillary: 97 mg/dL (ref 70–99)
Glucose-Capillary: 135 mg/dL — ABNORMAL HIGH (ref 70–99)
Glucose-Capillary: 184 mg/dL — ABNORMAL HIGH (ref 70–99)

## 2012-06-26 MED ORDER — POLYVINYL ALCOHOL 1.4 % OP SOLN
1.0000 [drp] | OPHTHALMIC | Status: DC | PRN
  Filled 2012-06-26: qty 15

## 2012-06-26 MED ORDER — ACETAMINOPHEN 650 MG RE SUPP: 650.0000 mg | Freq: Four times a day (QID) | RECTAL | Status: DC | PRN

## 2012-06-26 MED ORDER — ENOXAPARIN SODIUM 30 MG/0.3ML ~~LOC~~ SOLN
30.0000 mg | SUBCUTANEOUS | Status: DC
  Administered 2012-06-26: 30 mg via SUBCUTANEOUS
  Filled 2012-06-26 (×2): qty 0.3

## 2012-06-26 MED ORDER — SODIUM CHLORIDE 0.9 % IJ SOLN: 3.0000 mL | INTRAMUSCULAR | Status: DC | PRN

## 2012-06-26 MED ORDER — IOHEXOL 350 MG/ML SOLN
100.0000 mL | Freq: Once | INTRAVENOUS | Status: AC | PRN
  Administered 2012-06-26: 100 mL via INTRAVENOUS

## 2012-06-26 MED ORDER — SODIUM CHLORIDE 0.9 % IV SOLN: 250.0000 mL | INTRAVENOUS | Status: DC | PRN

## 2012-06-26 MED ORDER — ACETAMINOPHEN 325 MG PO TABS
650.0000 mg | ORAL_TABLET | Freq: Four times a day (QID) | ORAL | Status: DC | PRN
  Administered 2012-06-26: 650 mg via ORAL
  Filled 2012-06-26: qty 2

## 2012-06-26 MED ORDER — ONDANSETRON HCL 4 MG/2ML IJ SOLN: 4.0000 mg | Freq: Four times a day (QID) | INTRAMUSCULAR | Status: DC | PRN

## 2012-06-26 MED ORDER — MIDODRINE HCL 5 MG PO TABS
10.0000 mg | ORAL_TABLET | Freq: Three times a day (TID) | ORAL | Status: DC
  Administered 2012-06-26 – 2012-06-27 (×4): 10 mg via ORAL
  Filled 2012-06-26 (×7): qty 2

## 2012-06-26 MED ORDER — ASPIRIN 325 MG PO TABS
325.0000 mg | ORAL_TABLET | Freq: Every day | ORAL | Status: DC
  Administered 2012-06-26 – 2012-06-27 (×2): 325 mg via ORAL
  Filled 2012-06-26 (×2): qty 1

## 2012-06-26 MED ORDER — ONDANSETRON HCL 4 MG PO TABS: 4.0000 mg | ORAL_TABLET | Freq: Four times a day (QID) | ORAL | Status: DC | PRN

## 2012-06-26 MED ORDER — LEVALBUTEROL HCL 0.63 MG/3ML IN NEBU
0.6300 mg | INHALATION_SOLUTION | Freq: Four times a day (QID) | RESPIRATORY_TRACT | Status: DC | PRN
  Filled 2012-06-26: qty 3

## 2012-06-26 MED ORDER — SODIUM CHLORIDE 0.9 % IJ SOLN
3.0000 mL | Freq: Two times a day (BID) | INTRAMUSCULAR | Status: DC
  Administered 2012-06-26 (×2): 3 mL via INTRAVENOUS

## 2012-06-26 MED ORDER — INSULIN ASPART 100 UNIT/ML ~~LOC~~ SOLN
0.0000 [IU] | Freq: Three times a day (TID) | SUBCUTANEOUS | Status: DC
  Administered 2012-06-26 – 2012-06-27 (×3): 1 [IU] via SUBCUTANEOUS

## 2012-06-26 MED ORDER — SODIUM CHLORIDE 0.9 % IJ SOLN
3.0000 mL | Freq: Two times a day (BID) | INTRAMUSCULAR | Status: DC
  Administered 2012-06-26: 3 mL via INTRAVENOUS

## 2012-06-26 NOTE — Progress Notes (Signed)
[facility] Progress Note  Subjective:  Pain over his heart only a little.   Objective Filed Vitals:   06/26/12 0200 06/26/12 0320 06/26/12 0452 06/26/12 1326  BP: 94/63 72/34 73/49  88/53  Pulse: 86 87 86 86  Temp:  97.5 F (36.4 C) 97.8 F (36.6 C) 97.7 F (36.5 C)  TempSrc:  Oral Oral Oral  Resp: 13 16 16 16   Height:  5\' 2"  (1.575 m)    Weight:  47.446 kg (104 lb 9.6 oz)    SpO2: 100% 100% 99% 100%   Physical Exam General: sitting in bed without O2 NAD Heart: RRR no rub - minimal tenderness on chest palpation Lungs: no wheezes or rales Abdomen: soft Extremities: no LE edema Dialysis Access: left lower AVF + bruit  Dialysis Orders: Center: [facility] Optiflux 160 4h 400/A 1.5 2K 2.25 Ca left upper AVF var Na, standard heparin hectorol 1 EDW is 54.5, but consistently comes in below and leaves below EDW, 36 degrees   Assessment/Plan:  1. Hypotension - chronic, BP ; Midodrine 10 mg tid PRN, holding for SBP > 85 per primary; given midodrine at discharge 2/12; check orthostatics pre HD - keep even and give saline to keep supine systolic BP [id number] on HD 2. ESRD - HD on TTS @ [facility]; K 4.8, BUN 50, Cr 13 - CXR neg - given high volume of inpts and staffing issues due to weather, he is ok to wait until Saturday for dialysis.  He will be dialyzes first round with anticipated d/c thereafter; Have discussed this plan with Dr. Tana Coast. Hard to know if weight is accurate- he was below EDW last admission, but EDW not lowered at d/c.  Weight this am 47.5 - needs standing weights pre and post Hd with saline to keep systolic BP greater than 90 supine. 3. HIV - Not taking outpatient meds, ID/SW working to obtain antiviral meds through the state AIDS drug assistance program. 4. Chronic diarrhea - workup per primary, no diarrhea recently; C diff and O & P negative, stool culture neg 5. Anemia - Hgb > 12 Fe studies more than adequate - hold ESA 6. Secondary hyperparathyroidism - continue Hectorol 1  mcg, Tums with meals. 7. Nutrition - Alb  3.8, renal diet 8. DM Type 2 - on insulin per primary 9. Elevated LFTs - no other LFTs to compare from dialysis center -- hep C + - can be followed at his outpt HD center 10. Dispo - no further suggestions, disposition per primary - plan d/c post HD in am.  Myriam Jacobson, PA-C [place] [phone] 06/26/2012,2:04 PM  LOS: 1 day   Patient seen and examined.  Agree with assessment and plan as above. Kelly Splinter  MD [phone] pgr    [phone] cell 06/26/2012, 5:54 PM   Additional Objective Labs: Basic Metabolic Panel:  Recent Labs Lab 06/20/12 2126  06/23/12 0747 06/25/12 2320 06/26/12 0922  NA 131*  < > 136 137 134*  K 5.2*  < > 4.5 4.4 4.8  CL 96  < > 100 99 99  CO2 21  < > 23 19 16*  GLUCOSE 169*  < > 134* 166* 110*  BUN 48*  < > 40* 47* 50*  CREATININE 10.18*  < > 10.28* 12.15* 13.10*  CALCIUM 8.6  < > 8.3* 9.6 9.3  PHOS 5.1*  --  5.1*  --   --   < > = values in this interval not displayed. Liver  Function Tests:  Recent Labs Lab 06/20/12 2126 06/23/12 0747 06/25/12 2320  AST  --   --  77*  ALT  --   --  75*  ALKPHOS  --   --  110  BILITOT  --   --  0.2*  PROT  --   --  10.3*  ALBUMIN 2.7* 2.8* 3.8   CBC:  Recent Labs Lab 06/20/12 2125 06/23/12 0737 06/25/12 2320 06/26/12 0922  WBC 6.1 5.6 7.1 8.7  HGB 11.1* 10.3* 13.5 12.7*  HCT 31.8* 29.9* 38.6* 37.2*  MCV 90.9 90.6 90.6 91.0  PLT 190 162 240 247   Recent Labs Lab 06/26/12 0415 06/26/12 0922  TROPONINI <0.30 <0.30   CBG:  Recent Labs Lab 06/24/12 0750 06/24/12 1156 06/26/12 0316 06/26/12 0827 06/26/12 1302  GLUCAP 132* 138* 152* 97 137*   Iron Studies: No results found for this basename: IRON, TIBC, TRANSFERRIN, FERRITIN,  in the last 72 hours @lablastinr3 @ Studies/Results: Ct Angio Chest Pe W/cm &/or Wo Cm  06/26/2012  *RADIOLOGY REPORT*  Clinical Data: Chest pain and epigastric pain.  Worsening weakness,  hypotension and dizziness.  CT ANGIOGRAPHY CHEST  Technique:  Multidetector CT imaging of the chest using the standard protocol during bolus administration of intravenous contrast. Multiplanar reconstructed images including MIPs were obtained and reviewed to evaluate the vascular anatomy.  Contrast: 153mL OMNIPAQUE IOHEXOL 350 MG/ML SOLN  Comparison: CTA of the chest performed 01/10/2011, and chest radiograph performed 06/25/2012  Findings: There is no evidence of pulmonary embolus.  Small 4 mm pulmonary nodules are noted within the right upper and lower lung lobes (images 42 and 54 of 84).  Given multiplicity of nodules, this likely reflects granulomatous disease; these appear new from 2012.  There is no evidence of significant focal consolidation, pleural effusion or pneumothorax.  No masses are identified; no abnormal focal contrast enhancement is seen.  The mediastinum is unremarkable in appearance.  No mediastinal lymphadenopathy is seen.  No pericardial effusion is identified. The great vessels are unremarkable in appearance.  Incidental note is made of a direct origin of the left vertebral artery from the aortic arch.  No axillary lymphadenopathy is seen.  The visualized portions of the thyroid gland are unremarkable in appearance.  The visualized portions of the liver and spleen are unremarkable.  No acute osseous abnormalities are seen.  IMPRESSION:  1.  No evidence of pulmonary embolus. 2.  Small 4 mm pulmonary nodules within the right upper and lower lung lobes; given the multiplicity of nodules, this likely reflects granulomatous disease, new from 2012. 3.  Lungs otherwise clear.   Original Report Authenticated By: Santa Lighter, M.D.    [facility] 1 View  06/26/2012  *RADIOLOGY REPORT*  Clinical Data: Shortness of breath.  HIV.  PORTABLE CHEST - 1 VIEW  Comparison: 06/16/2012  Findings: Cardiac and mediastinal contours appear normal.  The lungs appear clear.  No pleural effusion is identified.   IMPRESSION:  No significant abnormality identified.   Original Report Authenticated By: Van Clines, M.D.    Medications:   . enoxaparin (LOVENOX) injection  30 mg Subcutaneous Q24H  . insulin aspart  0-9 Units Subcutaneous TID WC  . midodrine  10 mg Oral TID WC  . sodium chloride  3 mL Intravenous Q12H  . sodium chloride  3 mL Intravenous Q12H

## 2012-06-26 NOTE — Progress Notes (Signed)
Patient admitted to room 6735 with friend. Alert and oriented x 3. Kept comfortable. Ambulatory with assist.

## 2012-06-26 NOTE — Progress Notes (Signed)
Interpreter Lesle Chris for Care managment

## 2012-06-26 NOTE — ED Notes (Signed)
Pt transported to CT by this nurse. While in hallway, pt requested to use restroom, pt sat on side of bed with my assistance. At 0246 pt slumped in bed, pt did not respond to verbal stimuli. Pt's eyes continued to follow my movement. After approx 10 seconds pt began responding to verbal stimuli again. Pt vital signs stable and pt in NAD at this time.

## 2012-06-26 NOTE — Care Management Note (Addendum)
This CM asked interpreter to speak with pt re Midodrine as arrangements were made on 06/24/2012 at time of last d/c from hospitalization. Per pt , he has the medication, however he told the interpreter that he thought the medication was for pain, he was however taking 6 tabs per day as instructed.  The Outpatient pharmacy was able to provide only 10 days of the Midodrine on Wed 06/24/2012, however the rest of the prescription is now ready when pt is able to pick this up. The interpreter explained the action of the Midodrine to the pt and he voiced understanding.  Will ask nursing to reinforce importance of taking medication to pt at time of d/c. Jasmine Pang RN MPH [phone]

## 2012-06-26 NOTE — Progress Notes (Signed)
Patient ID: Alvin Daniels  male  [id number]    DOB: 1970/10/08    DOA: 06/25/2012  PCP: Default, Provider, MD  Assessment/Plan: Principal Problem:   Hypotension: Likely due to noncompliance but also appears to be baseline, patient talking on the phone, asymptomatic  - he has not been taking Midodrine and according to H&P and previous DC summary, he was provided assistance with [facility] program. - discussed with renal for consultation, will await recommendations  Active Problems:   RENAL FAILURE, END STAGE, metabolic bone ds: ON HD T-T-S - Missed his dialysis on Thursday, renal consulted  Chronic diarrhea - work-up negative - stool for Cultures, C. difficile PCR, Microsporidia, giardia, all negative.  - EGD and Cscope 07/17/2011 with gastric pathology negative for H. pylori, esophageal biopsy positive for some inflammation, and colonic mucosa biopsy negative.  -continue imodium prn  Normocytic anemia: secondary to end-stage renal disease. - H/H stable, No current indication for transfusion.   Severe protein calorie malnutrition  -. Continue Nepro shakes.   HIV INFECTION  -Last CD4 count is 300, Dr. [name] attempting to get him his HIV medicines free through his clinic.  -  outpt ID follow up with ID  DM  Continue insulin sliding scale.   Chest pain- resolved - CTA negative for PE, 2 sets for cardiac enzymes negative  DVT Prophylaxis:  Code Status:  Disposition: await Renal rec's    Subjective: Denies any specific complaints, taking on the phone, no pain  Objective: Weight change:   Intake/Output Summary (Last 24 hours) at 06/26/12 1329 Last data filed at 06/26/12 0600  Gross per 24 hour  Intake    120 ml  Output      0 ml  Net    120 ml   Blood pressure 88/53, pulse 86, temperature 97.7 F (36.5 C), temperature source Oral, resp. rate 16, height 5\' 2"  (1.575 m), weight 47.446 kg (104 lb 9.6 oz), SpO2 100.00%.  Physical Exam: General: Alert and awake,not  in any acute distress. CVS: S1-S2 clear, no murmur rubs or gallops Chest: CTAB Abdomen: soft nontender, nondistended, normal bowel sounds Extremities: no cyanosis, clubbing or edema noted bilaterally   Lab Results: Basic Metabolic Panel:  Recent Labs Lab 06/23/12 0747 06/25/12 2320 06/26/12 0922  NA 136 137 134*  K 4.5 4.4 4.8  CL 100 99 99  CO2 23 19 16*  GLUCOSE 134* 166* 110*  BUN 40* 47* 50*  CREATININE 10.28* 12.15* 13.10*  CALCIUM 8.3* 9.6 9.3  PHOS 5.1*  --   --    Liver Function Tests:  Recent Labs Lab 06/23/12 0747 06/25/12 2320  AST  --  77*  ALT  --  75*  ALKPHOS  --  110  BILITOT  --  0.2*  PROT  --  10.3*  ALBUMIN 2.8* 3.8   No results found for this basename: LIPASE, AMYLASE,  in the last 168 hours No results found for this basename: AMMONIA,  in the last 168 hours CBC:  Recent Labs Lab 06/25/12 2320 06/26/12 0922  WBC 7.1 8.7  HGB 13.5 12.7*  HCT 38.6* 37.2*  MCV 90.6 91.0  PLT 240 247   Cardiac Enzymes:  Recent Labs Lab 06/26/12 0415 06/26/12 0922  TROPONINI <0.30 <0.30   BNP: No components found with this basename: POCBNP,  CBG:  Recent Labs Lab 06/24/12 0750 06/24/12 1156 06/26/12 0316 06/26/12 0827 06/26/12 1302  GLUCAP 132* 138* 152* 97 137*     Micro Results: Recent Results (from the  past 240 hour(s))  STOOL CULTURE     Status: None   Collection Time    06/20/12 11:51 AM      Result Value Range Status   Specimen Description STOOL   Final   Special Requests NONE   Final   Culture     Final   Value: NO SALMONELLA, SHIGELLA, CAMPYLOBACTER, YERSINIA, OR E.COLI 0157:H7 ISOLATED   Report Status 06/24/2012 FINAL   Final  CLOSTRIDIUM DIFFICILE BY PCR     Status: None   Collection Time    06/20/12 11:51 AM      Result Value Range Status   C difficile by pcr NEGATIVE  NEGATIVE Final  CRYPTOSPORIDIUM SMEAR, FECAL     Status: None   Collection Time    06/20/12 11:51 AM      Result Value Range Status   Specimen  Description STOOL   Final   Special Requests NONE   Final   Cryptosporidium Smear. NO Cryptosporidium Cyclospora or Isospora seen.   Final   Report Status 06/22/2012 FINAL   Final  OVA AND PARASITE EXAMINATION     Status: None   Collection Time    06/20/12 11:51 AM      Result Value Range Status   Specimen Description STOOL   Final   Special Requests NONE   Final   Ova and parasites NO OVA OR PARASITES SEEN MODERATE YEAST   Final   Report Status 06/24/2012 FINAL   Final    Studies/Results: Dg Chest 2 View  06/16/2012  *RADIOLOGY REPORT*  Clinical Data: Low blood pressure and dizziness.  CHEST - 2 VIEW  Comparison: 10/08/2011  Findings: Slightly shallow inspiration.  Normal heart size and pulmonary vascularity.  Central interstitial changes and peribronchial thickening suggesting chronic bronchitis.  No evidence of focal consolidation or airspace disease.  No blunting of costophrenic angles.  No pneumothorax.  Mediastinal contours appear intact.  No significant changes since the previous study.  IMPRESSION: Chronic bronchitic changes.  No evidence of active pulmonary disease.   Original Report Authenticated By: Lucienne Capers, M.D.    Ct Head Wo Contrast  06/16/2012  *RADIOLOGY REPORT*  Clinical Data: Headache and hypotensive since dialysis.  CT HEAD WITHOUT CONTRAST  Technique:  Contiguous axial images were obtained from the base of the skull through the vertex without contrast.  Comparison: 06/02/2012  Findings: There is no intra or extra-axial fluid collection or mass lesion.  The basilar cisterns and ventricles have a normal appearance.  There is no CT evidence for acute infarction or hemorrhage.  Bone windows show no calvarial fracture.  Paranasal sinuses are clear.  IMPRESSION: Negative exam.   Original Report Authenticated By: Nolon Nations, M.D.    Ct Head Wo Contrast  06/02/2012  *RADIOLOGY REPORT*  Clinical Data: Pain and dizziness post trauma  CT HEAD WITHOUT CONTRAST  Technique:   Contiguous axial images were obtained from the base of the skull through the vertex without contrast.  Comparison: November 23, 2010  Findings: Ventricles are normal in size and configuration.  There is no mass, hemorrhage, extra-axial fluid collection, or midline shift.  Gray-white compartments are normal.  Bony calvarium appears intact.  The mastoid air cells are clear.  IMPRESSION: Study within normal limits.   Original Report Authenticated By: Lowella Grip, M.D.    Ct Angio Chest Pe W/cm &/or Wo Cm  06/26/2012  *RADIOLOGY REPORT*  Clinical Data: Chest pain and epigastric pain.  Worsening weakness, hypotension and dizziness.  CT ANGIOGRAPHY CHEST  Technique:  Multidetector CT imaging of the chest using the standard protocol during bolus administration of intravenous contrast. Multiplanar reconstructed images including MIPs were obtained and reviewed to evaluate the vascular anatomy.  Contrast: 154mL OMNIPAQUE IOHEXOL 350 MG/ML SOLN  Comparison: CTA of the chest performed 01/10/2011, and chest radiograph performed 06/25/2012  Findings: There is no evidence of pulmonary embolus.  Small 4 mm pulmonary nodules are noted within the right upper and lower lung lobes (images 42 and 54 of 84).  Given multiplicity of nodules, this likely reflects granulomatous disease; these appear new from 2012.  There is no evidence of significant focal consolidation, pleural effusion or pneumothorax.  No masses are identified; no abnormal focal contrast enhancement is seen.  The mediastinum is unremarkable in appearance.  No mediastinal lymphadenopathy is seen.  No pericardial effusion is identified. The great vessels are unremarkable in appearance.  Incidental note is made of a direct origin of the left vertebral artery from the aortic arch.  No axillary lymphadenopathy is seen.  The visualized portions of the thyroid gland are unremarkable in appearance.  The visualized portions of the liver and spleen are unremarkable.  No acute  osseous abnormalities are seen.  IMPRESSION:  1.  No evidence of pulmonary embolus. 2.  Small 4 mm pulmonary nodules within the right upper and lower lung lobes; given the multiplicity of nodules, this likely reflects granulomatous disease, new from 2012. 3.  Lungs otherwise clear.   Original Report Authenticated By: Santa Lighter, M.D.    Mr Cervical Spine Wo Contrast  06/02/2012  *RADIOLOGY REPORT*  Clinical Data: Blurred vision with multiple falls over the last 3 days.  Evaluate for cervical diskitis.  MRI CERVICAL SPINE WITHOUT CONTRAST  Technique:  Multiplanar and multiecho pulse sequences of the cervical spine, to include the craniocervical junction and cervicothoracic junction, were obtained according to standard protocol without intravenous contrast.  Comparison: None.  Findings: The cervical alignment is normal.  There is no evidence of diskitis or paraspinal inflammatory change.  There is no endplate edema or destruction.  There is low signal throughout the bones which appears nonfocal.  The craniocervical junction appears normal.  The cervical cord is normal in signal and caliber.  There are bilateral vertebral artery flow voids.  Axial imaging demonstrates a small central disc protrusion at C5- C6.  There is no cord deformity.  There is no foraminal stenosis. There are no other significant disc space findings.  IMPRESSION:  1.  No evidence of cervical diskitis, osteomyelitis or paraspinal inflammatory change. 2.  Small central disc protrusion at C5-C6.  No cord deformity or foraminal compromise. 3.  Nonspecific low marrow signal throughout the visualized bones. This can be seen as a normal variant, in smokers and subsequent to anemia.   Original Report Authenticated By: Richardean Sale, M.D.    Dg Chest Port 1 View  06/26/2012  *RADIOLOGY REPORT*  Clinical Data: Shortness of breath.  HIV.  PORTABLE CHEST - 1 VIEW  Comparison: 06/16/2012  Findings: Cardiac and mediastinal contours appear normal.  The  lungs appear clear.  No pleural effusion is identified.  IMPRESSION:  No significant abnormality identified.   Original Report Authenticated By: Van Clines, M.D.     Medications: Scheduled Meds: . enoxaparin (LOVENOX) injection  30 mg Subcutaneous Q24H  . insulin aspart  0-9 Units Subcutaneous TID WC  . midodrine  10 mg Oral TID WC  . sodium chloride  3 mL Intravenous Q12H  . sodium chloride  3 mL Intravenous  Q12H      LOS: 1 day   Eddison Searls M.D. [facility] 06/26/2012, 1:29 PM Pager: [phone]  If 7PM-7AM, please contact night-coverage [email or web address] Password [email or web address]

## 2012-06-26 NOTE — H&P (Signed)
[facility] History and Physical  Minato Delanuez [id number] DOB: Feb 06, 1971 DOA: 06/25/2012  Referring physician:  Aaron Edelman, MD    PCP: Default, Provider, MD   Chief Complaint: Chest Pain  .  Weakness    HPI:  42 y.o. male who is sent over to the ED after missing his dialysis session on Tuesday and Thursday.Pt reports becoming increasingly weak throughout the day with central chest pain and stomach pain. Pt has shunt in left forearm. Per EMS pt was carried from home to stretcher, pt unable to ambulate on scene. Today he was noted to be hypotensive and dizzy with systolic blood pressure the 80s. Apparently the patient is supposed to be on midodrine and has been noncompliant with it. He also complains of chest pain for the last 6 hours. The pain is nonradiating, not particularly related to exertion. He denies any fever.       Review of Systems: negative for the following  Constitutional: Denies fever, chills, diaphoresis, appetite change and fatigue.  HEENT: Denies photophobia, eye pain, redness, hearing loss, ear pain, congestion, sore throat, rhinorrhea, sneezing, mouth sores, trouble swallowing, neck pain, neck stiffness and tinnitus.  Respiratory: Denies SOB, DOE, cough, chest tightness, and wheezing.  Cardiovascular: Denies chest pain, palpitations and leg swelling.  Gastrointestinal: Denies nausea, vomiting, abdominal pain, diarrhea, constipation, blood in stool and abdominal distention.  Genitourinary: Denies dysuria, urgency, frequency, hematuria, flank pain and difficulty urinating.  Musculoskeletal: Denies myalgias, back pain, joint swelling, arthralgias and gait problem.  Skin: Denies pallor, rash and wound.  Neurological: Denies dizziness, seizures, syncope, weakness, light-headedness, numbness and headaches.  Hematological: Denies adenopathy. Easy bruising, personal or family bleeding history  Psychiatric/Behavioral: Denies suicidal ideation,  mood changes, confusion, nervousness, sleep disturbance and agitation       Past Medical History  Diagnosis Date  . ESRD (end stage renal disease) on dialysis   . Hepatitis C   . Dialysis patient   . MRSA infection   . Pancreatitis   . Chronic diarrhea   . DM 06/27/2010    Annotation: uncontrolled Qualifier: Diagnosis of  By: Nickola Major CMA ( AAMA), Geni Bers    . HIV INFECTION 06/27/2010    Qualifier: Diagnosis of  By: Nickola Major CMA ( [place]), Geni Bers    . Hypotension 06/02/2012  . Metabolic bone disease [id number]  . Normocytic anemia 06/17/2012  . Severe protein-calorie malnutrition 06/17/2012     Past Surgical History  Procedure Laterality Date  . No past surgeries        Social History:  reports that he has never smoked. He has never used smokeless tobacco. He reports that he does not drink alcohol or use illicit drugs.    No Known Allergies  Family History  Problem Relation Age of Onset  . Diabetes Mother   . Diabetes Father      Prior to Admission medications   Not on File     Physical Exam: Filed Vitals:   06/25/12 2303 06/25/12 2330 06/25/12 2345 06/25/12 2345  BP:  85/41 126/85 126/85  Pulse:  86 86   Temp:      TempSrc:      Resp:  12 10   SpO2: 100% 100% 100%      Constitutional: Vital signs reviewed. Patient is a well-developed and well-nourished in no acute distress and cooperative with exam. Alert and oriented x3.  Head: Normocephalic and atraumatic  Ear: TM normal bilaterally  Mouth: no erythema or exudates, MMM  Eyes: PERRL, EOMI, conjunctivae normal,  No scleral icterus.  Neck: Supple, Trachea midline normal ROM, No JVD, mass, thyromegaly, or carotid bruit present.  Cardiovascular: RRR, S1 normal, S2 normal, no MRG, pulses symmetric and intact bilaterally  Pulmonary/Chest: CTAB, no wheezes, rales, or rhonchi  Abdominal: Soft. Non-tender, non-distended, bowel sounds are normal, no masses, organomegaly, or guarding present.  GU: no CVA  tenderness Musculoskeletal: No joint deformities, erythema, or stiffness, ROM full and no nontender Ext: no edema and no cyanosis, pulses palpable bilaterally (DP and PT)  Hematology: no cervical, inginal, or axillary adenopathy.  Neurological: A&O x3, Strenght is normal and symmetric bilaterally, cranial nerve II-XII are grossly intact, no focal motor deficit, sensory intact to light touch bilaterally.  Skin: Warm, dry and intact. No rash, cyanosis, or clubbing.  Psychiatric: Normal mood and affect. speech and behavior is normal. Judgment and thought content normal. Cognition and memory are normal.       Labs on Admission:    Basic Metabolic Panel:  Recent Labs Lab 06/20/12 0546 06/20/12 2126 06/22/12 1230 06/23/12 0747 06/25/12 2320  NA 133* 131* 136 136 137  K 4.2 5.2* 4.1 4.5 4.4  CL 96 96 95* 100 99  CO2 26 21 29 23 19   GLUCOSE 128* 169* 150* 134* 166*  BUN 37* 48* 28* 40* 47*  CREATININE 8.03* 10.18* 8.13* 10.28* 12.15*  CALCIUM 8.5 8.6 8.7 8.3* 9.6  PHOS  --  5.1*  --  5.1*  --    Liver Function Tests:  Recent Labs Lab 06/20/12 2126 06/23/12 0747 06/25/12 2320  AST  --   --  77*  ALT  --   --  75*  ALKPHOS  --   --  110  BILITOT  --   --  0.2*  PROT  --   --  10.3*  ALBUMIN 2.7* 2.8* 3.8   No results found for this basename: LIPASE, AMYLASE,  in the last 168 hours No results found for this basename: AMMONIA,  in the last 168 hours CBC:  Recent Labs Lab 06/20/12 2125 06/23/12 0737 06/25/12 2320  WBC 6.1 5.6 7.1  HGB 11.1* 10.3* 13.5  HCT 31.8* 29.9* 38.6*  MCV 90.9 90.6 90.6  PLT 190 162 240   Cardiac Enzymes: No results found for this basename: CKTOTAL, CKMB, CKMBINDEX, TROPONINI,  in the last 168 hours  BNP (last 3 results) No results found for this basename: PROBNP,  in the last 8760 hours    CBG:  Recent Labs Lab 06/23/12 1140 06/23/12 1705 06/23/12 2201 06/24/12 0750 06/24/12 1156  GLUCAP 103* 187* 154* 132* 138*     Radiological Exams on Admission: Dg Chest Port 1 View  06/26/2012  *RADIOLOGY REPORT*  Clinical Data: Shortness of breath.  HIV.  PORTABLE CHEST - 1 VIEW  Comparison: 06/16/2012  Findings: Cardiac and mediastinal contours appear normal.  The lungs appear clear.  No pleural effusion is identified.  IMPRESSION:  No significant abnormality identified.   Original Report Authenticated By: Van Clines, M.D.     EKG: Independently reviewed. Date: 06/25/2012  Rate: 86  Rhythm: normal sinus rhythm  QRS Axis: normal  Intervals: normal  ST/T Wave abnormalities: normal  Conduction Disutrbances: none  Narrative Interpretation: unremarkable      Assessment/Plan Principal Problem:   Hypotension Active Problems:   RENAL FAILURE, END STAGE   Hypotension:  -Appears to have low BP at baseline and has not been taking his midodrine, also has chronic diarrhea  -Exacerbated by inability to afford chronic medications to treat  this (Midodrine). Got 250 cc bolus in ED   CM has been able to provide this to him via the [facility] program. Recently admitted for the same reason and discharged  2/12 Chronic diarrhea  - stool for C&S, -ve C. difficile PCR all negative during her last admission. Microsporidia, giardia, negative.  -Status post upper endoscopy and colonoscopy 07/17/2011 with gastric pathology negative for H. pylori, esophageal biopsy positive for some inflammation, and colonic mucosa biopsy negative.  -continue imodium pre and mid HD as scheduled.  Metabolic bone disease  -Intact PTH 77 on 06/11/2012. Continue Hectorol with hemodialysis.  Headache  -CT of head done 06/16/2012 and normal  -improved  Normocytic anemia  -Anemia secondary to end-stage renal disease. Hemoglobin stable. No current indication for transfusion.  Severe protein calorie malnutrition  -. Continue Nepro shakes.  HIV INFECTION  -Last CD4 count is 300  -ID, Dr. [name] attempting to get him his HIV medicines free through  his clinic.  ID signed off , outpt ID follow up  RENAL FAILURE, END STAGE, secondary to diabetic nephropathy, K 4.4 -Gets dialysis at the [facility]: Tu/Th/Sat.  Please call nephrology in the morning  DM  Continue insulin sliding scale.  Chest pain Admit to tele , will obtain CTA to r/o PE , given chest pain, he does not make any urine    Code Status:   full Family Communication: bedside Disposition Plan: admit   Time spent: 70 mins   [facility] Pager [phone]  If 7PM-7AM, please contact night-coverage [email or web address] Password [facility] 06/26/2012, 12:57 AM

## 2012-06-27 DIAGNOSIS — E1165 Type 2 diabetes mellitus with hyperglycemia: Secondary | ICD-10-CM

## 2012-06-27 DIAGNOSIS — E1129 Type 2 diabetes mellitus with other diabetic kidney complication: Secondary | ICD-10-CM

## 2012-06-27 LAB — GLUCOSE, CAPILLARY: Glucose-Capillary: 126 mg/dL — ABNORMAL HIGH (ref 70–99)

## 2012-06-27 MED ORDER — ALTEPLASE 2 MG IJ SOLR: 2.0000 mg | Freq: Once | INTRAMUSCULAR | Status: DC | PRN

## 2012-06-27 MED ORDER — NEPRO/CARBSTEADY PO LIQD
237.0000 mL | ORAL | Status: DC | PRN
  Filled 2012-06-27: qty 237

## 2012-06-27 MED ORDER — SODIUM CHLORIDE 0.9 % IV SOLN: 100.0000 mL | INTRAVENOUS | Status: DC | PRN

## 2012-06-27 MED ORDER — POLYVINYL ALCOHOL 1.4 % OP SOLN: 1.0000 [drp] | OPHTHALMIC | Status: DC | PRN

## 2012-06-27 MED ORDER — HEPARIN SODIUM (PORCINE) 1000 UNIT/ML DIALYSIS: 1000.0000 [IU] | INTRAMUSCULAR | Status: DC | PRN

## 2012-06-27 MED ORDER — PENTAFLUOROPROP-TETRAFLUOROETH EX AERO: 1.0000 "application " | INHALATION_SPRAY | CUTANEOUS | Status: DC | PRN

## 2012-06-27 MED ORDER — MIDODRINE HCL 10 MG PO TABS: 10.0000 mg | ORAL_TABLET | Freq: Three times a day (TID) | ORAL | Status: DC

## 2012-06-27 MED ORDER — LIDOCAINE HCL (PF) 1 % IJ SOLN: 5.0000 mL | INTRAMUSCULAR | Status: DC | PRN

## 2012-06-27 MED ORDER — LIDOCAINE-PRILOCAINE 2.5-2.5 % EX CREA: 1.0000 "application " | TOPICAL_CREAM | CUTANEOUS | Status: DC | PRN

## 2012-06-27 MED ORDER — HEPARIN SODIUM (PORCINE) 1000 UNIT/ML DIALYSIS: 20.0000 [IU]/kg | INTRAMUSCULAR | Status: DC | PRN

## 2012-06-27 NOTE — Procedures (Signed)
I was present at this dialysis session. I have reviewed the session itself and made appropriate changes.   Kelly Splinter, MD [employer] 06/27/2012, 8:57 AM

## 2012-06-27 NOTE — Progress Notes (Signed)
Bed alarm place on d/t history of recent fall last admission and high fall risk however pt continues to cut off bed alarm. Pt has been educated on why the alarm is on but continues to turn off alarm

## 2012-06-27 NOTE — Progress Notes (Addendum)
[facility] Progress Note  Subjective:  Today c/o of mucho dolor left eye Objective Filed Vitals:   06/27/12 0610 06/27/12 0630 06/27/12 0700 06/27/12 0730  BP: 91/51 128/62 76/48 78/48   Pulse: 61 75 54 53  Temp:      TempSrc:      Resp:  14 20 14   Height:      Weight:      SpO2:       Physical Exam General: on HD NAD Heart: RRR no rub - Lungs: no wheezes or rales Abdomen: soft Extremities: no LE edema Dialysis Access: left lower AVF + bruit Qb 400  Dialysis Orders: Center: [facility] TTS Optiflux 160 4h 400/A 1.5 2K 2.25 Ca left upper AVF var Na, standard heparin hectorol 1 EDW is 54.5, 36 degrees   Assessment/Plan:  1. Hypotension - chronic, BP ; Midodrine 10 mg tid PRN, holding for SBP > 85 per primary; given midodrine at discharge 2/12; check orthostatics pre HD - keep even and give saline to keep supine systolic BP [id number] on HD; nursing only gave 100 cc saline.  Wt pre HD 46.9.  Non orthostatic this am with systolic [id number] - not sure about 128/62 BP above - likely aberrant  ; given additional bolus 500 cc on HD as well.  See CM note - regarding additional meds. 2. ESRD - HD on TTS @ [facility];  - CXR neg -- he was below EDW last admission, but EDW not lowered at d/c.  Weight Thursday 47.5 - needs standing weights pre and post Hd with saline to keep systolic BP greater than 90 supine.- see above - lower EDW at discharge. 3. HIV - Not taking outpatient meds, ID/SW working to obtain antiviral meds through the state AIDS drug assistance program. 4. Chronic diarrhea - workup per primary, no diarrhea recently; C diff and O & P negative, stool culture neg - needs to increase pos 5. Anemia - Hgb > 12 Fe studies more than adequate - hold ESA 6. Secondary hyperparathyroidism - continue Hectorol 1 mcg, Tums with meals. 7. Nutrition - Alb  3.8, renal diet 8. DM Type 2 - on insulin per primary 9. Elevated LFTs - no other LFTs to compare from dialysis center -- hep C + - can be followed at  his outpt HD center 10. Dispo - no further suggestions, disposition per primary - plan d/c post HD in am. 11. Eye pain - MRI headyesterday - results pending  Myriam Jacobson, PA-C [place] 06/27/2012,8:00 AM  LOS: 2 days   Patient seen and examined.  Agree with assessment and plan as above. Kelly Splinter  MD [phone] pgr    [phone] cell 06/27/2012, 8:56 AM   Additional Objective Labs: Basic Metabolic Panel:  Recent Labs Lab 06/20/12 2126  06/23/12 0747 06/25/12 2320 06/26/12 0922  NA 131*  < > 136 137 134*  K 5.2*  < > 4.5 4.4 4.8  CL 96  < > 100 99 99  CO2 21  < > 23 19 16*  GLUCOSE 169*  < > 134* 166* 110*  BUN 48*  < > 40* 47* 50*  CREATININE 10.18*  < > 10.28* 12.15* 13.10*  CALCIUM 8.6  < > 8.3* 9.6 9.3  PHOS 5.1*  --  5.1*  --   --   < > = values in this interval not displayed. Liver Function Tests:  Recent Labs Lab 06/20/12 2126 06/23/12 0747 06/25/12 2320  AST  --   --  77*  ALT  --   --  75*  ALKPHOS  --   --  110  BILITOT  --   --  0.2*  PROT  --   --  10.3*  ALBUMIN 2.7* 2.8* 3.8   CBC:  Recent Labs Lab 06/20/12 2125 06/23/12 0737 06/25/12 2320 06/26/12 0922  WBC 6.1 5.6 7.1 8.7  HGB 11.1* 10.3* 13.5 12.7*  HCT 31.8* 29.9* 38.6* 37.2*  MCV 90.9 90.6 90.6 91.0  PLT 190 162 240 247    Recent Labs Lab 06/26/12 0415 06/26/12 0922 06/26/12 1545  TROPONINI <0.30 <0.30 <0.30   CBG:  Recent Labs Lab 06/26/12 0316 06/26/12 0827 06/26/12 1302 06/26/12 1809 06/26/12 2227  GLUCAP 152* 97 137* 135* 184*  Studies/Results: Ct Angio Chest Pe W/cm &/or Wo Cm  06/26/2012  *RADIOLOGY REPORT*  Clinical Data: Chest pain and epigastric pain.  Worsening weakness, hypotension and dizziness.  CT ANGIOGRAPHY CHEST  Technique:  Multidetector CT imaging of the chest using the standard protocol during bolus administration of intravenous contrast. Multiplanar reconstructed images including MIPs were obtained and  reviewed to evaluate the vascular anatomy.  Contrast: 147mL OMNIPAQUE IOHEXOL 350 MG/ML SOLN  Comparison: CTA of the chest performed 01/10/2011, and chest radiograph performed 06/25/2012  Findings: There is no evidence of pulmonary embolus.  Small 4 mm pulmonary nodules are noted within the right upper and lower lung lobes (images 42 and 54 of 84).  Given multiplicity of nodules, this likely reflects granulomatous disease; these appear new from 2012.  There is no evidence of significant focal consolidation, pleural effusion or pneumothorax.  No masses are identified; no abnormal focal contrast enhancement is seen.  The mediastinum is unremarkable in appearance.  No mediastinal lymphadenopathy is seen.  No pericardial effusion is identified. The great vessels are unremarkable in appearance.  Incidental note is made of a direct origin of the left vertebral artery from the aortic arch.  No axillary lymphadenopathy is seen.  The visualized portions of the thyroid gland are unremarkable in appearance.  The visualized portions of the liver and spleen are unremarkable.  No acute osseous abnormalities are seen.  IMPRESSION:  1.  No evidence of pulmonary embolus. 2.  Small 4 mm pulmonary nodules within the right upper and lower lung lobes; given the multiplicity of nodules, this likely reflects granulomatous disease, new from 2012. 3.  Lungs otherwise clear.   Original Report Authenticated By: Santa Lighter, M.D.    [facility] 1 View  06/26/2012  *RADIOLOGY REPORT*  Clinical Data: Shortness of breath.  HIV.  PORTABLE CHEST - 1 VIEW  Comparison: 06/16/2012  Findings: Cardiac and mediastinal contours appear normal.  The lungs appear clear.  No pleural effusion is identified.  IMPRESSION:  No significant abnormality identified.   Original Report Authenticated By: Van Clines, M.D.    Medications:   . aspirin  325 mg Oral Daily  . enoxaparin (LOVENOX) injection  30 mg Subcutaneous Q24H  . insulin aspart  0-9  Units Subcutaneous TID WC  . midodrine  10 mg Oral TID WC  . sodium chloride  3 mL Intravenous Q12H  . sodium chloride  3 mL Intravenous Q12H

## 2012-06-27 NOTE — Progress Notes (Signed)
All d/c instructions explained and given to pt.  Verbalized understanding. Pt asking for bus pass.  S W. Called and stated she will come up to see pt.  Pt informed.  Karie Kirks, Therapist, sports.

## 2012-06-27 NOTE — Discharge Summary (Signed)
Physician Discharge Summary  Patient ID: Alvin Daniels MRN: [id number] DOB/AGE: 42-Nov-1972 42 y.o.  Admit date: 06/25/2012 Discharge date: 06/27/2012  Primary Care Physician:    Discharge Diagnoses:    . RENAL FAILURE, END STAGE . Hypotension- LIKELY AT HIS BASELINE in noncompliance with Midodrine .  Anemia of chronic renal failure . HIV INFECTION . DIABETES MELLITUS, TYPE II, UNCONTROLLED, W/RENAL COMPS . Chronic diarrhea . Severe protein-calorie malnutrition . Normocytic anemia  Consults:  Nephrology  Discharge Medications:   Medication List    TAKE these medications       midodrine 10 MG tablet  Commonly known as:  PROAMATINE  Take 1 tablet (10 mg total) by mouth 3 (three) times daily with meals.     polyvinyl alcohol 1.4 % ophthalmic solution  Commonly known as:  LIQUIFILM TEARS  Place 1 drop into the left eye as needed (left eye).         Brief H and P: For complete details please refer to admission H and P, but in brief 42 y.o. male details please refer to admission H and P, but in brief 42 y.o. male who is sent over to the ED after missing his dialysis session on Thursday.Pt reported becoming increasingly weak throughout the day with central chest pain and stomach pain. Pt has shunt in left forearm. Per EMS pt was carried from home to stretcher, pt unable to ambulate on scene. On the day of admission, he was noted to be hypotensive and dizzy with systolic blood pressure the 80s. Apparently the patient is supposed to be on midodrine and has been noncompliant with it. He also complained of chest pain for the last 6 hours. The pain is nonradiating, not particularly related to exertion. He denied any fever.   Hospital Course:   Hypotension: Likely due to noncompliance but also appears to be baseline, remained asymptomatic. He did receive several boluses with the hemodialysis. He has not been taking Midodrine and according to H&P and previous DC summary, he was provided assistance with [facility] program. Case manager was consulted and  his prescription is ready to pick up. (Patient was explained that midodrine is for the blood pressure and patient reported that he thought it was for pain).  RENAL FAILURE, END STAGE, metabolic bone ds: ON HD T-T-S  He missed his dialysis on Thursday, renal was consulted for HD. He gets dialysis at the [facility]: Tu/Th/Sat. Next dialysis on Tuesday.  Chronic diarrhea - work-up negative during previous admission. Stool for Cultures, C. difficile PCR, Microsporidia, giardia, all negative. - EGD and Cscope 07/17/2011 with gastric pathology negative for H. pylori, esophageal biopsy positive for some inflammation, and colonic mucosa biopsy negative. continue imodium prn.   Normocytic anemia: secondary to end-stage renal disease.  H/H remained stable, No current indication for transfusion.   Severe protein calorie malnutrition:. Continue Nepro shakes.   HIV INFECTION  -Last CD4 count is 300, Dr. [name] attempting to get him his HIV medicines free through his clinic.  - outpt ID follow up with ID    Chest pain- resolved - CTA negative for PE, 3 sets for cardiac enzymes negative  Left eye visual changes : On 06/26/2012, patient reported pain and visual changes in the left eye. MRI of the brain was obtained which showed no stroke. Patient was provided with artificial tears, he did not appear to have any conjunctivitis.     Day of Discharge BP 94/60  Pulse 68  Temp(Src) 97.7 F (36.5 C) (Oral)  Resp 21  Ht 5\' 2"  (1.575 m)  Wt 48.4 kg (  106 lb 11.2 oz)  BMI 19.51 kg/m2  SpO2 100%  Physical Exam: General: Alert and awake oriented x3 not in any acute distress. HEENT: anicteric sclera, pupils reactive to light and accommodation CVS: S1-S2 clear no murmur rubs or gallops Chest: clear to auscultation bilaterally, no wheezing rales or rhonchi Abdomen: soft nontender, nondistended, normal bowel sounds, no organomegaly Extremities: no cyanosis, clubbing or edema noted  bilaterally Neuro: Cranial nerves II-XII intact, no focal neurological deficits   The results of significant diagnostics from this hospitalization (including imaging, microbiology, ancillary and laboratory) are listed below for reference.    LAB RESULTS: Basic Metabolic Panel:  Recent Labs Lab 06/23/12 0747 06/25/12 2320 06/26/12 0922  NA 136 137 134*  K 4.5 4.4 4.8  CL 100 99 99  CO2 23 19 16*  GLUCOSE 134* 166* 110*  BUN 40* 47* 50*  CREATININE 10.28* 12.15* 13.10*  CALCIUM 8.3* 9.6 9.3  PHOS 5.1*  --   --    Liver Function Tests:  Recent Labs Lab 06/23/12 0747 06/25/12 2320  AST  --  77*  ALT  --  75*  ALKPHOS  --  110  BILITOT  --  0.2*  PROT  --  10.3*  ALBUMIN 2.8* 3.8  CBC:  Recent Labs Lab 06/25/12 2320 06/26/12 0922  WBC 7.1 8.7  HGB 13.5 12.7*  HCT 38.6* 37.2*  MCV 90.6 91.0  PLT 240 247   Cardiac Enzymes:  Recent Labs Lab 06/26/12 0922 06/26/12 1545  TROPONINI <0.30 <0.30   BNP: No components found with this basename: POCBNP,  CBG:  Recent Labs Lab 06/26/12 1809 06/26/12 2227  GLUCAP 135* 184*    Significant Diagnostic Studies:  Ct Angio Chest Pe W/cm &/or Wo Cm  06/26/2012  *RADIOLOGY REPORT*  Clinical Data: Chest pain and epigastric pain.  Worsening weakness, hypotension and dizziness.  CT ANGIOGRAPHY CHEST  Technique:  Multidetector CT imaging of the chest using the standard protocol during bolus administration of intravenous contrast. Multiplanar reconstructed images including MIPs were obtained and reviewed to evaluate the vascular anatomy.  Contrast: 1104mL OMNIPAQUE IOHEXOL 350 MG/ML SOLN  Comparison: CTA of the chest performed 01/10/2011, and chest radiograph performed 06/25/2012  Findings: There is no evidence of pulmonary embolus.  Small 4 mm pulmonary nodules are noted within the right upper and lower lung lobes (images 42 and 54 of 84).  Given multiplicity of nodules, this likely reflects granulomatous disease; these appear  new from 2012.  There is no evidence of significant focal consolidation, pleural effusion or pneumothorax.  No masses are identified; no abnormal focal contrast enhancement is seen.  The mediastinum is unremarkable in appearance.  No mediastinal lymphadenopathy is seen.  No pericardial effusion is identified. The great vessels are unremarkable in appearance.  Incidental note is made of a direct origin of the left vertebral artery from the aortic arch.  No axillary lymphadenopathy is seen.  The visualized portions of the thyroid gland are unremarkable in appearance.  The visualized portions of the liver and spleen are unremarkable.  No acute osseous abnormalities are seen.  IMPRESSION:  1.  No evidence of pulmonary embolus. 2.  Small 4 mm pulmonary nodules within the right upper and lower lung lobes; given the multiplicity of nodules, this likely reflects granulomatous disease, new from 2012. 3.  Lungs otherwise clear.   Original Report Authenticated By: Santa Lighter, M.D.    Mr Brain Wo Contrast  06/27/2012  *RADIOLOGY REPORT*  Clinical Data: HIV.  Generalized weakness.  Chronic renal failure, on dialysis.  Anemia.  Diabetes.  Chronic hypotension. Chest pain.  MRI HEAD WITHOUT CONTRAST  Technique:  Multiplanar, multiecho pulse sequences of the brain and surrounding structures were obtained according to standard protocol without intravenous contrast.  Comparison: 06/26/2012 CT.  Findings: There is no evidence for acute infarction, intracranial hemorrhage, mass lesion, hydrocephalus, or extra-axial fluid. There is no significant cerebral or cerebellar atrophy.  No cortical brain substance loss.   Minimal periventricular greater than subcortical white matter hypodensity, could represent early chronic microvascular ischemic change.  Early HIV encephalopathy not favored.  No foci of chronic hemorrhage.  Major intracranial vascular structures widely patent.  Negative orbits.  Shotty cervical adenopathy, incompletely  evaluated.  Abnormal marrow signal is seen in the upper cervical spine, clivus and to a lesser degree the calvarium.  This is likely secondary to anemia of chronic disease/renal failure/HIV.  Given the diffuse nature, a marrow infiltrative process is not favored.  No upper cervical spine compression.  Normal pituitary and cerebellar tonsils.  Contrast not administered due to the patient's renal failure.  IMPRESSION:  No acute intracranial findings.  Mild periventricular greater than subcortical white matter signal abnormality, likely early chronic microvascular ischemic change.  Diffuse marrow signal abnormality, likely anemia/chronic disease/HIV related.   Original Report Authenticated By: Rolla Flatten, M.D.    Dg Chest Port 1 View  06/26/2012  *RADIOLOGY REPORT*  Clinical Data: Shortness of breath.  HIV.  PORTABLE CHEST - 1 VIEW  Comparison: 06/16/2012  Findings: Cardiac and mediastinal contours appear normal.  The lungs appear clear.  No pleural effusion is identified.  IMPRESSION:  No significant abnormality identified.   Original Report Authenticated By: Van Clines, M.D.      Disposition and Follow-up: Discharge Orders   Future Orders Complete By Expires     Discharge instructions  As directed     Comments:      Please take Midodrine for your blood pressure three times day with meals.    Increase activity slowly  As directed         DISPOSITION: Home  DIET: Renal diet ACTIVITY: As tolerated  DISCHARGE FOLLOW-UP Follow-up Information   Follow up with DUNHAM,CYNTHIA B, MD. Schedule an appointment as soon as possible for a visit in 10 days. (for follow-up)    Contact information:   [place] [id number] [phone]       Time spent on Discharge: 37 mins   Signed:   RAI,RIPUDEEP M.D. [facility] 06/27/2012, 11:33 AM Pager: [phone]

## 2012-06-27 NOTE — Progress Notes (Signed)
Pt received bus pass from SW and d/c home.  Accompany by friend.  Karie Kirks, Therapist, sports.

## 2012-06-27 NOTE — Clinical Social Work Note (Signed)
Provided patient with two bus passes and map of route home. Friend accompanying patient to bus stop. No further needs identified.  [name], [place] (weekend)

## 2012-07-03 LAB — MICROSPORIDIA SPORE STAIN, FECES

## 2012-07-07 ENCOUNTER — Telehealth: Payer: Self-pay | Admitting: *Deleted

## 2012-07-07 ENCOUNTER — Inpatient Hospital Stay: Payer: Self-pay | Admitting: Infectious Diseases

## 2012-07-07 NOTE — Telephone Encounter (Signed)
Called and left message for patient to call the office to reschedule appt, he no showed today. Myrtis Hopping

## 2012-07-15 ENCOUNTER — Telehealth: Payer: Self-pay | Admitting: *Deleted

## 2012-07-15 ENCOUNTER — Telehealth: Payer: Self-pay

## 2012-07-15 DIAGNOSIS — B20 Human immunodeficiency virus [HIV] disease: Secondary | ICD-10-CM

## 2012-07-15 MED ORDER — EMTRICITABINE 200 MG PO CAPS: 200.0000 mg | ORAL_CAPSULE | ORAL | Status: DC

## 2012-07-15 MED ORDER — RITONAVIR 100 MG PO TABS: 100.0000 mg | ORAL_TABLET | Freq: Every day | ORAL | Status: DC

## 2012-07-15 MED ORDER — DARUNAVIR ETHANOLATE 800 MG PO TABS: 800.0000 mg | ORAL_TABLET | Freq: Every day | ORAL | Status: DC

## 2012-07-15 MED ORDER — TENOFOVIR DISOPROXIL FUMARATE 300 MG PO TABS: 300.0000 mg | ORAL_TABLET | ORAL | Status: DC

## 2012-07-15 NOTE — Telephone Encounter (Signed)
Will restart the patient on his last known ART medications which are Prezista, Emtriva, Norvir and Viread. Will call the patient and have him call the pharmacy to set up delivery.

## 2012-07-15 NOTE — Telephone Encounter (Signed)
Okay to restart his ART, thanks

## 2012-07-15 NOTE — Telephone Encounter (Signed)
Patient approved for ADAP - called phone number, left message for patient to contact [facility] and gave phone #.

## 2012-07-15 NOTE — Telephone Encounter (Signed)
Patient ADAP is approved and he called to ask if he could have refills of his 042 medications. He advised he feels a lot better. I advised the patient he may need a follow up visit with the doctor as he was very sick at his last visit. Advised him after review of his chart and last OV that I would have to ask his provider before I could send refills as at time of last visit 06-16-12 he was told to stop all meds until he was stabilized. Advised patient will give him a call once doctor gives the [facility]. Advised the patient if he has any questions to call the clinic.

## 2012-07-20 ENCOUNTER — Other Ambulatory Visit: Payer: Self-pay | Admitting: Infectious Diseases

## 2012-07-20 DIAGNOSIS — B20 Human immunodeficiency virus [HIV] disease: Secondary | ICD-10-CM

## 2012-07-20 MED ORDER — SULFAMETHOXAZOLE-TRIMETHOPRIM 800-160 MG PO TABS: 1.0000 | ORAL_TABLET | ORAL | Status: DC

## 2012-08-06 ENCOUNTER — Emergency Department (HOSPITAL_COMMUNITY): Payer: Medicaid Other

## 2012-08-06 ENCOUNTER — Encounter (HOSPITAL_COMMUNITY): Payer: Self-pay | Admitting: Emergency Medicine

## 2012-08-06 ENCOUNTER — Emergency Department (HOSPITAL_COMMUNITY)
Admission: EM | Admit: 2012-08-06 | Discharge: 2012-08-06 | Disposition: A | Discharge status: Home / Self Care | Payer: Medicaid Other | Attending: Emergency Medicine | Admitting: Emergency Medicine

## 2012-08-06 DIAGNOSIS — Z992 Dependence on renal dialysis: Secondary | ICD-10-CM | POA: Insufficient documentation

## 2012-08-06 DIAGNOSIS — Z862 Personal history of diseases of the blood and blood-forming organs and certain disorders involving the immune mechanism: Secondary | ICD-10-CM | POA: Insufficient documentation

## 2012-08-06 DIAGNOSIS — Z8614 Personal history of Methicillin resistant Staphylococcus aureus infection: Secondary | ICD-10-CM | POA: Insufficient documentation

## 2012-08-06 DIAGNOSIS — Z8619 Personal history of other infectious and parasitic diseases: Secondary | ICD-10-CM | POA: Insufficient documentation

## 2012-08-06 DIAGNOSIS — Z79899 Other long term (current) drug therapy: Secondary | ICD-10-CM | POA: Insufficient documentation

## 2012-08-06 DIAGNOSIS — Z8639 Personal history of other endocrine, nutritional and metabolic disease: Secondary | ICD-10-CM | POA: Insufficient documentation

## 2012-08-06 DIAGNOSIS — E119 Type 2 diabetes mellitus without complications: Secondary | ICD-10-CM | POA: Insufficient documentation

## 2012-08-06 DIAGNOSIS — N186 End stage renal disease: Secondary | ICD-10-CM | POA: Insufficient documentation

## 2012-08-06 DIAGNOSIS — G510 Bell's palsy: Secondary | ICD-10-CM | POA: Insufficient documentation

## 2012-08-06 DIAGNOSIS — Z21 Asymptomatic human immunodeficiency virus [HIV] infection status: Secondary | ICD-10-CM | POA: Insufficient documentation

## 2012-08-06 DIAGNOSIS — I959 Hypotension, unspecified: Secondary | ICD-10-CM | POA: Insufficient documentation

## 2012-08-06 LAB — POCT I-STAT, CHEM 8
Chloride: 95 mEq/L — ABNORMAL LOW (ref 96–112)
Glucose, Bld: 149 mg/dL — ABNORMAL HIGH (ref 70–99)
HCT: 35 % — ABNORMAL LOW (ref 39.0–52.0)
Hemoglobin: 11.9 g/dL — ABNORMAL LOW (ref 13.0–17.0)
Potassium: 3.5 mEq/L (ref 3.5–5.1)
Sodium: 141 mEq/L (ref 135–145)

## 2012-08-06 MED ORDER — PREDNISONE (PAK) 10 MG PO TABS: 10.0000 mg | ORAL_TABLET | Freq: Every day | ORAL | Status: DC

## 2012-08-06 NOTE — ED Notes (Signed)
Patient transported to MRI 

## 2012-08-06 NOTE — ED Notes (Signed)
Patient from [facility] with facial droop,  Per EMT patient received full treatment of dialysis and staff noticed right sided facial droop.  No deficits or weakness at this time.  Patient was seen and treated for headache recently.  Interpreter phone at bedside.

## 2012-08-06 NOTE — ED Provider Notes (Signed)
History     CSN: [id number]  Arrival date & time 08/06/12  1134   First MD Initiated Contact with Patient 08/06/12 1146      Chief Complaint  Patient presents with  . Facial Droop     The history is limited by a language barrier.   Patient from [facility] with facial droop, Per EMT patient received full treatment of dialysis and staff noticed right sided facial droop. No deficits or weakness at this time. Patient was seen and treated for headache recently. Interpreter phone at bedside.  Symptoms started on Sunday.  Patient has no other unilateral weakness.   Past Medical History  Diagnosis Date  . ESRD (end stage renal disease) on dialysis   . Hepatitis C   . Dialysis patient   . MRSA infection   . Pancreatitis   . Chronic diarrhea   . DM 06/27/2010    Annotation: uncontrolled Qualifier: Diagnosis of  By: Nickola Major CMA ( AAMA), Geni Bers    . HIV INFECTION 06/27/2010    Qualifier: Diagnosis of  By: Nickola Major CMA ( [place]), Geni Bers    . Hypotension 06/02/2012  . Metabolic bone disease [id number]  . Normocytic anemia 06/17/2012  . Severe protein-calorie malnutrition 06/17/2012    Past Surgical History  Procedure Laterality Date  . No past surgeries      Family History  Problem Relation Age of Onset  . Diabetes Mother   . Diabetes Father     History  Substance Use Topics  . Smoking status: Never Smoker   . Smokeless tobacco: Never Used  . Alcohol Use: No      Review of Systems  Unable to perform ROS: Other    Allergies  Review of patient's allergies indicates no known allergies.  Home Medications   Current Outpatient Rx  Name  Route  Sig  Dispense  Refill  . Darunavir Ethanolate (PREZISTA) 800 MG tablet   Oral   Take 1 tablet (800 mg total) by mouth daily.   30 tablet   6   . emtricitabine (EMTRIVA) 200 MG capsule   Oral   Take 1 capsule (200 mg total) by mouth every Tuesday. And every Thursday after dialysis.   10 capsule   6   .  midodrine (PROAMATINE) 10 MG tablet   Oral   Take 1 tablet (10 mg total) by mouth 3 (three) times daily with meals.   90 tablet   3   . polyvinyl alcohol (LIQUIFILM TEARS) 1.4 % ophthalmic solution   Left Eye   Place 1 drop into the left eye as needed (left eye).   15 mL   3   . ritonavir (NORVIR) 100 MG TABS   Oral   Take 1 tablet (100 mg total) by mouth daily.   30 tablet   6   . tenofovir (VIREAD) 300 MG tablet   Oral   Take 1 tablet (300 mg total) by mouth once a week. Every Sunday.   5 tablet   6     BP 95/58  Pulse 92  Temp(Src) 98.6 F (37 C) (Oral)  Resp 14  SpO2 99%  Physical Exam  Nursing note and vitals reviewed. Constitutional: He is oriented to person, place, and time. He appears well-developed and well-nourished. No distress.  HENT:  Head: Normocephalic and atraumatic.  Eyes: Pupils are equal, round, and reactive to light.  Neck: Normal range of motion.  Cardiovascular: Normal rate and intact distal pulses.   Pulmonary/Chest: No respiratory  distress.  Abdominal: Normal appearance. He exhibits no distension.  Musculoskeletal: Normal range of motion.  Neurological: He is alert and oriented to person, place, and time. He has normal strength. A cranial nerve deficit ( right sided facial droooop) is present. GCS eye subscore is 4. GCS verbal subscore is 5. GCS motor subscore is 6.  Skin: Skin is warm and dry. No rash noted.  Psychiatric: He has a normal mood and affect. His behavior is normal.    ED Course  Procedures (including critical care time)  Labs Reviewed  POCT I-STAT, CHEM 8 - Abnormal; Notable for the following:    Chloride 95 (*)    BUN 4 (*)    Creatinine, Ser 3.60 (*)    Glucose, Bld 149 (*)    Calcium, Ion 1.04 (*)    Hemoglobin 11.9 (*)    HCT 35.0 (*)    All other components within normal limits   Mr Brain Wo Contrast  08/06/2012  *RADIOLOGY REPORT*  Clinical Data: Right facial droop after dialysis  MRI HEAD WITHOUT CONTRAST   Technique:  Multiplanar, multiecho pulse sequences of the brain and surrounding structures were obtained according to standard protocol without intravenous contrast.  Comparison: MRI 06/26/2012  Findings: Negative for acute infarct.  No significant chronic ischemic changes are present.  Brainstem and cerebellum are intact. Cerebral white matter is intact.  Negative for hemorrhage or mass lesion.  Minimal mucosal edema in the paranasal sinuses.  IMPRESSION: Normal MRI of the brain.  Mild chronic sinusitis.   Original Report Authenticated By: Carl Best, M.D.      1. Bell's palsy       MDM   Patient symptoms began Sunday so most likely out of the window for significant improvement from prednisone Willstart short course.      Dot Lanes, MD 08/06/12 [phone]

## 2013-03-23 ENCOUNTER — Telehealth: Payer: Self-pay | Admitting: *Deleted

## 2013-03-23 ENCOUNTER — Encounter: Payer: Self-pay | Admitting: *Deleted

## 2013-03-23 NOTE — Telephone Encounter (Signed)
Will mail the pt a letter.  Invalid phone number

## 2013-04-15 ENCOUNTER — Encounter (HOSPITAL_COMMUNITY): Payer: Self-pay | Admitting: Emergency Medicine

## 2013-04-15 ENCOUNTER — Emergency Department (HOSPITAL_COMMUNITY): Payer: Medicaid Other

## 2013-04-15 ENCOUNTER — Inpatient Hospital Stay (HOSPITAL_COMMUNITY)
Admission: EM | Admit: 2013-04-15 | Discharge: 2013-04-22 | DRG: 312 | Disposition: A | Discharge status: Home / Self Care | Payer: Medicaid Other | Attending: Internal Medicine | Admitting: Internal Medicine

## 2013-04-15 DIAGNOSIS — E1142 Type 2 diabetes mellitus with diabetic polyneuropathy: Secondary | ICD-10-CM | POA: Diagnosis present

## 2013-04-15 DIAGNOSIS — R112 Nausea with vomiting, unspecified: Secondary | ICD-10-CM | POA: Diagnosis not present

## 2013-04-15 DIAGNOSIS — N186 End stage renal disease: Secondary | ICD-10-CM

## 2013-04-15 DIAGNOSIS — IMO0002 Reserved for concepts with insufficient information to code with codable children: Secondary | ICD-10-CM

## 2013-04-15 DIAGNOSIS — E1129 Type 2 diabetes mellitus with other diabetic kidney complication: Secondary | ICD-10-CM

## 2013-04-15 DIAGNOSIS — I959 Hypotension, unspecified: Secondary | ICD-10-CM

## 2013-04-15 DIAGNOSIS — E46 Unspecified protein-calorie malnutrition: Secondary | ICD-10-CM | POA: Diagnosis present

## 2013-04-15 DIAGNOSIS — Z91199 Patient's noncompliance with other medical treatment and regimen due to unspecified reason: Secondary | ICD-10-CM

## 2013-04-15 DIAGNOSIS — S59909A Unspecified injury of unspecified elbow, initial encounter: Secondary | ICD-10-CM

## 2013-04-15 DIAGNOSIS — I120 Hypertensive chronic kidney disease with stage 5 chronic kidney disease or end stage renal disease: Secondary | ICD-10-CM | POA: Diagnosis present

## 2013-04-15 DIAGNOSIS — R51 Headache with orthostatic component, not elsewhere classified: Secondary | ICD-10-CM

## 2013-04-15 DIAGNOSIS — X58XXXA Exposure to other specified factors, initial encounter: Secondary | ICD-10-CM

## 2013-04-15 DIAGNOSIS — R55 Syncope and collapse: Secondary | ICD-10-CM | POA: Diagnosis present

## 2013-04-15 DIAGNOSIS — B182 Chronic viral hepatitis C: Secondary | ICD-10-CM

## 2013-04-15 DIAGNOSIS — R109 Unspecified abdominal pain: Secondary | ICD-10-CM

## 2013-04-15 DIAGNOSIS — R0602 Shortness of breath: Secondary | ICD-10-CM | POA: Insufficient documentation

## 2013-04-15 DIAGNOSIS — E11319 Type 2 diabetes mellitus with unspecified diabetic retinopathy without macular edema: Secondary | ICD-10-CM | POA: Diagnosis present

## 2013-04-15 DIAGNOSIS — R197 Diarrhea, unspecified: Secondary | ICD-10-CM | POA: Diagnosis present

## 2013-04-15 DIAGNOSIS — E1165 Type 2 diabetes mellitus with hyperglycemia: Secondary | ICD-10-CM

## 2013-04-15 DIAGNOSIS — Z992 Dependence on renal dialysis: Secondary | ICD-10-CM

## 2013-04-15 DIAGNOSIS — E43 Unspecified severe protein-calorie malnutrition: Secondary | ICD-10-CM

## 2013-04-15 DIAGNOSIS — I951 Orthostatic hypotension: Principal | ICD-10-CM

## 2013-04-15 DIAGNOSIS — D631 Anemia in chronic kidney disease: Secondary | ICD-10-CM | POA: Diagnosis present

## 2013-04-15 DIAGNOSIS — K529 Noninfective gastroenteritis and colitis, unspecified: Secondary | ICD-10-CM

## 2013-04-15 DIAGNOSIS — B20 Human immunodeficiency virus [HIV] disease: Secondary | ICD-10-CM

## 2013-04-15 DIAGNOSIS — E1139 Type 2 diabetes mellitus with other diabetic ophthalmic complication: Secondary | ICD-10-CM | POA: Diagnosis present

## 2013-04-15 DIAGNOSIS — R066 Hiccough: Secondary | ICD-10-CM | POA: Diagnosis not present

## 2013-04-15 DIAGNOSIS — B1920 Unspecified viral hepatitis C without hepatic coma: Secondary | ICD-10-CM | POA: Diagnosis present

## 2013-04-15 DIAGNOSIS — Z794 Long term (current) use of insulin: Secondary | ICD-10-CM

## 2013-04-15 DIAGNOSIS — Z9119 Patient's noncompliance with other medical treatment and regimen: Secondary | ICD-10-CM

## 2013-04-15 DIAGNOSIS — Z8614 Personal history of Methicillin resistant Staphylococcus aureus infection: Secondary | ICD-10-CM

## 2013-04-15 DIAGNOSIS — N2581 Secondary hyperparathyroidism of renal origin: Secondary | ICD-10-CM | POA: Diagnosis present

## 2013-04-15 DIAGNOSIS — E1149 Type 2 diabetes mellitus with other diabetic neurological complication: Secondary | ICD-10-CM | POA: Diagnosis present

## 2013-04-15 DIAGNOSIS — Z833 Family history of diabetes mellitus: Secondary | ICD-10-CM

## 2013-04-15 DIAGNOSIS — E1122 Type 2 diabetes mellitus with diabetic chronic kidney disease: Secondary | ICD-10-CM | POA: Diagnosis present

## 2013-04-15 LAB — CBC WITH DIFFERENTIAL/PLATELET
Basophils Relative: 0 % (ref 0–1)
HCT: 36.6 % — ABNORMAL LOW (ref 39.0–52.0)
Hemoglobin: 12.9 g/dL — ABNORMAL LOW (ref 13.0–17.0)
Lymphocytes Relative: 28 % (ref 12–46)
Lymphs Abs: 2.2 10*3/uL (ref 0.7–4.0)
MCHC: 35.2 g/dL (ref 30.0–36.0)
Monocytes Absolute: 0.6 10*3/uL (ref 0.1–1.0)
Monocytes Relative: 8 % (ref 3–12)
Neutro Abs: 4.7 10*3/uL (ref 1.7–7.7)
Neutrophils Relative %: 60 % (ref 43–77)
RBC: 3.98 MIL/uL — ABNORMAL LOW (ref 4.22–5.81)
WBC: 7.7 10*3/uL (ref 4.0–10.5)

## 2013-04-15 LAB — COMPREHENSIVE METABOLIC PANEL
Albumin: 3.6 g/dL (ref 3.5–5.2)
Alkaline Phosphatase: 112 U/L (ref 39–117)
BUN: 41 mg/dL — ABNORMAL HIGH (ref 6–23)
CO2: 21 mEq/L (ref 19–32)
Chloride: 95 mEq/L — ABNORMAL LOW (ref 96–112)
Creatinine, Ser: 10.06 mg/dL — ABNORMAL HIGH (ref 0.50–1.35)
GFR calc non Af Amer: 6 mL/min — ABNORMAL LOW (ref >90)
Potassium: 3.4 mEq/L — ABNORMAL LOW (ref 3.5–5.1)
Total Bilirubin: 0.4 mg/dL (ref 0.3–1.2)

## 2013-04-15 LAB — LIPID PANEL
Cholesterol: 87 mg/dL (ref 0–200)
HDL: 37 mg/dL — ABNORMAL LOW (ref >39)
LDL Cholesterol: 30 mg/dL (ref 0–99)

## 2013-04-15 LAB — MRSA PCR SCREENING: MRSA by PCR: NEGATIVE

## 2013-04-15 LAB — TROPONIN I: Troponin I: <0.3 ng/mL (ref <0.30)

## 2013-04-15 MED ORDER — ACETAMINOPHEN 650 MG RE SUPP: 650.0000 mg | Freq: Four times a day (QID) | RECTAL | Status: DC | PRN

## 2013-04-15 MED ORDER — TETRACAINE HCL 0.5 % OP SOLN
2.0000 [drp] | Freq: Once | OPHTHALMIC | Status: AC
  Administered 2013-04-15: 2 [drp] via OPHTHALMIC
  Filled 2013-04-15: qty 2

## 2013-04-15 MED ORDER — HEPARIN SODIUM (PORCINE) 5000 UNIT/ML IJ SOLN
5000.0000 [IU] | Freq: Three times a day (TID) | INTRAMUSCULAR | Status: DC
  Administered 2013-04-15 – 2013-04-22 (×18): 5000 [IU] via SUBCUTANEOUS
  Filled 2013-04-15 (×24): qty 1

## 2013-04-15 MED ORDER — ACETAMINOPHEN 325 MG PO TABS
650.0000 mg | ORAL_TABLET | Freq: Four times a day (QID) | ORAL | Status: DC | PRN
  Administered 2013-04-15 – 2013-04-20 (×4): 650 mg via ORAL
  Filled 2013-04-15 (×4): qty 2

## 2013-04-15 MED ORDER — FLUORESCEIN SODIUM 1 MG OP STRP
1.0000 | ORAL_STRIP | Freq: Once | OPHTHALMIC | Status: AC
  Administered 2013-04-15: 1 via OPHTHALMIC
  Filled 2013-04-15: qty 1

## 2013-04-15 MED ORDER — SODIUM CHLORIDE 0.9 % IV BOLUS (SEPSIS)
250.0000 mL | Freq: Once | INTRAVENOUS | Status: AC
  Administered 2013-04-15: 250 mL via INTRAVENOUS

## 2013-04-15 MED ORDER — ONDANSETRON HCL 4 MG/2ML IJ SOLN
4.0000 mg | Freq: Four times a day (QID) | INTRAMUSCULAR | Status: DC | PRN
  Administered 2013-04-18 – 2013-04-22 (×6): 4 mg via INTRAVENOUS
  Filled 2013-04-15 (×5): qty 2

## 2013-04-15 MED ORDER — DOXERCALCIFEROL 4 MCG/2ML IV SOLN
1.0000 ug | INTRAVENOUS | Status: DC
  Administered 2013-04-15 – 2013-04-20 (×3): 1 ug via INTRAVENOUS
  Filled 2013-04-15 (×3): qty 2

## 2013-04-15 MED ORDER — HEPATITIS B VAC RECOMBINANT 5 MCG/0.5ML IJ SUSP
0.5000 mL | Freq: Once | INTRAMUSCULAR | Status: AC
Start: 2013-04-15 — End: 2013-04-16
  Administered 2013-04-16: 5 ug via INTRAMUSCULAR
  Filled 2013-04-15: qty 0.5

## 2013-04-15 MED ORDER — BOOST / RESOURCE BREEZE PO LIQD
1.0000 | ORAL | Status: DC
  Administered 2013-04-15 – 2013-04-22 (×7): 1 via ORAL

## 2013-04-15 MED ORDER — ONDANSETRON HCL 4 MG PO TABS
4.0000 mg | ORAL_TABLET | Freq: Four times a day (QID) | ORAL | Status: DC | PRN
  Administered 2013-04-18: 4 mg via ORAL
  Filled 2013-04-15 (×2): qty 1

## 2013-04-15 MED ORDER — SODIUM CHLORIDE 0.9 % IJ SOLN
3.0000 mL | Freq: Two times a day (BID) | INTRAMUSCULAR | Status: DC
  Administered 2013-04-15 – 2013-04-22 (×13): 3 mL via INTRAVENOUS

## 2013-04-15 MED ORDER — INSULIN ASPART PROT & ASPART (70-30 MIX) 100 UNIT/ML ~~LOC~~ SUSP
10.0000 [IU] | Freq: Two times a day (BID) | SUBCUTANEOUS | Status: DC
  Administered 2013-04-15 – 2013-04-18 (×7): 10 [IU] via SUBCUTANEOUS
  Filled 2013-04-15: qty 10

## 2013-04-15 MED ORDER — ALBUTEROL SULFATE (5 MG/ML) 0.5% IN NEBU: 2.5000 mg | INHALATION_SOLUTION | RESPIRATORY_TRACT | Status: DC | PRN

## 2013-04-15 MED ORDER — DOXERCALCIFEROL 4 MCG/2ML IV SOLN
INTRAVENOUS | Status: AC
  Filled 2013-04-15: qty 2

## 2013-04-15 NOTE — ED Notes (Signed)
MD Rancour and Stann Mainland at bedside.

## 2013-04-15 NOTE — Progress Notes (Signed)
INITIAL NUTRITION ASSESSMENT  DOCUMENTATION CODES Per approved criteria  -Not Applicable   INTERVENTION: Add Resource Breeze po daily, each supplement provides 250 kcal and 9 grams of protein. RD to follow along for need for oral nutrition supplements.  NUTRITION DIAGNOSIS: Increased nutrient needs related to HD and HIV as evidenced by estimated needs.   Goal: Intake to meet >90% of estimated nutrition needs.  Monitor:  weight trends, lab trends, I/O's, PO intake, supplement tolerance  Reason for Assessment: Malnutrition Screening Tool  42 y.o. male  Admitting Dx: Orthostatic hypotension  ASSESSMENT: PMHx significant for ESRD on TTS HD, hepatitis C and HIV, DM with neuropathy, chronic diarrhea, hypotension. Patient has had problems with chronic hypotension and presents to the dialysis center with blood pressures of 60's, receives 250-300 mL of fluids across dialysis and completes dialysis. Admitted with orthostatic hypotension, frontal HA's and associated blurred vision for several weeks.   Pt notes that he has chronic post-prandial diarrhea. Has not taken any HIV-meds for at least 1 year. RD to add supplements to increase oral intake to prevent weight loss, pt is at nutrition risk given HIV and need for ongoing HD.  RD discussed nutrition hx with patient via Engineer, site. Pt has had a poor appetite for about 6 months. He states that he has very limited access to foods. He lives with a friend that sometimes is able to bring him foods from a [place] (question compliance to renal diet.) Doesn't take oral nutrition supplements.  Potassium is low at 3.4. Sodium is low at 134.   Pt is at nutrition risk given limited access to foods and chronic medical issues.  Height: Ht Readings from Last 1 Encounters:  04/15/13 5' 2.99" (1.6 m)    Weight: Wt Readings from Last 1 Encounters:  04/15/13 111 lb 9.6 oz (50.621 kg)   Dry weight is 53.5 kg  Ideal Body Weight:  124 lb  % Ideal Body Weight: 90%  Wt Readings from Last 10 Encounters:  04/15/13 111 lb 9.6 oz (50.621 kg)  06/27/12 106 lb 11.2 oz (48.4 kg)  06/23/12 112 lb 3.4 oz (50.9 kg)  06/16/12 117 lb (53.071 kg)  06/02/12 119 lb 4.3 oz (54.1 kg)  07/17/11 112 lb (50.803 kg)  07/10/11 112 lb 11.2 oz (51.12 kg)  07/01/11 114 lb (51.71 kg)  03/29/11 106 lb 12.8 oz (48.444 kg)  12/12/10 103 lb (46.72 kg)    Usual Body Weight: 112 - 114 lb  % Usual Body Weight: 98%  BMI:  Body mass index is 19.77 kg/(m^2). Weight is WNL  Estimated Nutritional Needs: Kcal: 1500 - 1700 Protein: 75 - 90 g Fluid: 1.2 liters  Skin: no issues noted  Diet Order: Carb Control Medium  EDUCATION NEEDS: -No education needs identified at this time   Intake/Output Summary (Last 24 hours) at 04/15/13 1439 Last data filed at 04/15/13 1418  Gross per 24 hour  Intake      3 ml  Output      0 ml  Net      3 ml    Last BM: PTA  Labs:   Recent Labs Lab 04/15/13 0750  NA 134*  K 3.4*  CL 95*  CO2 21  BUN 41*  CREATININE 10.06*  CALCIUM 8.7  GLUCOSE 123*    CBG (last 3)  No results found for this basename: GLUCAP,  in the last 72 hours  Scheduled Meds: . doxercalciferol  1 mcg Intravenous Q T,Th,Sa-HD  . heparin  5,000 Units Subcutaneous Q8H  . insulin aspart protamine- aspart  10 Units Subcutaneous BID WC  . sodium chloride  3 mL Intravenous Q12H    Continuous Infusions:  none  Past Medical History  Diagnosis Date  . ESRD (end stage renal disease) on dialysis   . Hepatitis C   . Dialysis patient   . MRSA infection   . Pancreatitis   . Chronic diarrhea   . HIV INFECTION 06/27/2010    Qualifier: Diagnosis of  By: Nickola Major CMA ( [place]), Geni Bers    . Hypotension 06/02/2012  . Metabolic bone disease [id number]  . Normocytic anemia 06/17/2012  . Severe protein-calorie malnutrition 06/17/2012  . DM 06/27/2010    Annotation: uncontrolled Qualifier: Diagnosis of  By: Nickola Major CMA ( AAMA),  Geni Bers    . [id number])     Past Surgical History  Procedure Laterality Date  . Av fistula placement Left     Inda Coke MS, RD, LDN Pager: [phone] After-hours pager: [phone]

## 2013-04-15 NOTE — ED Provider Notes (Signed)
CSN: [id number]     Arrival date & time 04/15/13  0711 History   First MD Initiated Contact with Patient 04/15/13 0719     Chief Complaint  Patient presents with  . Fall  . Headache  . Dizziness   HPI 42 year old man with PMH ESRD on HD TTS, HIV, Hep C, DM2, hypotension who presents with headache.  Patient is Spanish-speaking only thus history was obtained through interpreter.   Patient states that 3 weeks ago he fell and since that time has had frontal headache and blurry vision in his right eye.  He has also noticed watering of his right eye for the past 3 months. He does not feel dizzy but has been unable to walk due to headache, "pain does not allow it" per interpreter.  Denies fevers, neck pain or stiffness, chest pain, shortness of breath, abdominal pain, weakness, numbness/tingling.   He does not know the names of his medications but has not been taking anti-retrovirals recently.  He cannot say why.   He has attended all recent dialysis sessions including on Tuesday of this week and stayed the full amount of time.   He was admitted for chest pain after missed HD session in 06/2012.  He was dizzy and unable to ambulate at that time.  Patient is supposed to take midodrine but has history of medication non-compliance.   Past Medical History  Diagnosis Date  . ESRD (end stage renal disease) on dialysis   . Hepatitis C   . Dialysis patient   . MRSA infection   . Pancreatitis   . Chronic diarrhea   . DM 06/27/2010    Annotation: uncontrolled Qualifier: Diagnosis of  By: Nickola Major CMA ( AAMA), Geni Bers    . HIV INFECTION 06/27/2010    Qualifier: Diagnosis of  By: Nickola Major CMA ( Butts), Geni Bers    . Hypotension 06/02/2012  . Metabolic bone disease [id number]  . Normocytic anemia 06/17/2012  . Severe protein-calorie malnutrition 06/17/2012   Past Surgical History  Procedure Laterality Date  . No past surgeries     Family History  Problem Relation Age of Onset  . Diabetes Mother    . Diabetes Father    History  Substance Use Topics  . Smoking status: Never Smoker   . Smokeless tobacco: Never Used  . Alcohol Use: No    Review of Systems  Constitutional: Positive for activity change. Negative for fever and chills.  HENT: Negative for congestion and sore throat.   Eyes: Positive for discharge, redness and visual disturbance. Negative for photophobia, pain and itching.  Respiratory: Negative for cough, chest tightness and shortness of breath.   Cardiovascular: Negative for chest pain and leg swelling.  Gastrointestinal: Negative for nausea, vomiting, abdominal pain, diarrhea, constipation and blood in stool.  Genitourinary: Negative for dysuria.  Musculoskeletal: Positive for gait problem. Negative for joint swelling, neck pain and neck stiffness.  Neurological: Positive for headaches. Negative for dizziness, syncope, weakness and numbness.    Allergies  Review of patient's allergies indicates no known allergies.  Home Medications   Current Outpatient Rx  Name  Route  Sig  Dispense  Refill  . insulin aspart protamine- aspart (NOVOLOG MIX 70/30) (70-30) 100 UNIT/ML injection   Subcutaneous   Inject 15-20 Units into the skin See admin instructions. Takes 15 units in the morning, and 20 units at bedtime          BP 108/84  Pulse 82  Temp(Src) 97.9 F (36.6 C) (Oral)  Resp 17  Ht 5' 2.99" (1.6 m)  Wt 111 lb 9.6 oz (50.621 kg)  BMI 19.77 kg/m2  SpO2 100% Physical Exam  Constitutional: He is oriented to person, place, and time. He appears well-developed and well-nourished. No distress.  HENT:  Head: Normocephalic and atraumatic.  Eyes: Right eye exhibits discharge. No scleral icterus.  Pupils equal but do not contract to light bilaterally.   Neck: Normal range of motion. Neck supple.  Cardiovascular: Normal rate and regular rhythm.  Exam reveals no gallop and no friction rub.   No murmur heard. Pulmonary/Chest: Effort normal and breath sounds  normal. He has no wheezes. He has no rales.  Abdominal: Soft. Bowel sounds are normal. He exhibits no distension. There is no tenderness.  Musculoskeletal: Normal range of motion.  Neurological: He is alert and oriented to person, place, and time. He has normal strength. No cranial nerve deficit or sensory deficit. Gait abnormal.  +Romberg, ataxic gait, nasolabial fold on right  Skin: Rash noted. He is not diaphoretic.  Rash on forehead    ED Course  Procedures (including critical care time) Labs Review Labs Reviewed  CBC WITH DIFFERENTIAL - Abnormal; Notable for the following:    RBC 3.98 (*)    Hemoglobin 12.9 (*)    HCT 36.6 (*)    All other components within normal limits  COMPREHENSIVE METABOLIC PANEL - Abnormal; Notable for the following:    Sodium 134 (*)    Potassium 3.4 (*)    Chloride 95 (*)    Glucose, Bld 123 (*)    BUN 41 (*)    Creatinine, Ser 10.06 (*)    Total Protein 9.6 (*)    AST 51 (*)    ALT 63 (*)    GFR calc non Af Amer 6 (*)    GFR calc Af Amer 6 (*)    All other components within normal limits  TROPONIN I  URINALYSIS, ROUTINE W REFLEX MICROSCOPIC  HIV 1 RNA QUANT-NO REFLEX-BLD  T-HELPER CELLS (CD4) COUNT   Imaging Review Dg Chest 2 View  04/15/2013   CLINICAL DATA:  Dizziness and facial droop  EXAM: CHEST  2 VIEW  COMPARISON:  Chest CT June 26, 2012 and chest radiograph June 25, 2012  FINDINGS: Lungs are clear. Heart size and pulmonary vascularity are normal. No adenopathy. No bone lesions.  IMPRESSION: No abnormality noted.   Electronically Signed   By: Lowella Grip M.D.   On: 04/15/2013 10:42   Ct Head Wo Contrast  04/15/2013   CLINICAL DATA:  Headache and dizziness status post fall.  EXAM: CT HEAD WITHOUT CONTRAST  TECHNIQUE: Contiguous axial images were obtained from the base of the skull through the vertex without intravenous contrast.  COMPARISON:  MRI of the brain dated August 06, 2012.  FINDINGS: The ventricles are normal in size  and position. There is no intracranial hemorrhage nor intracranial mass effect. There is no evidence of an evolving ischemic infarction. The cerebellum and brainstem are normal in density.  At bone window settings the observed portions of the paranasal sinuses and mastoid air cells are clear. There is no evidence of an acute skull fracture.  IMPRESSION: Normal noncontrast CT scan of the brain.   Electronically Signed   By: David  Martinique   On: 04/15/2013 08:29   Mr Brain Wo Contrast  04/15/2013   CLINICAL DATA:  Acute onset of dizziness and headache post fall. End-stage renal disease. Diabetic. HIV infection. Hepatitis-C.  EXAM: MRI HEAD WITHOUT  CONTRAST  TECHNIQUE: Multiplanar, multiecho pulse sequences of the brain and surrounding structures were obtained without intravenous contrast.  COMPARISON:  04/15/2013 CT.  06/26/2012 MR.  FINDINGS: No acute infarct.  No intracranial hemorrhage.  No hydrocephalus.  No intracranial mass lesion noted on this unenhanced exam.  Small left vertebral artery as noted on prior exam. Major intracranial vascular structures are patent.  Abnormal bone marrow may reflect changes of chronic disease/ anemia and noted previously.  Cerebellar tonsils minimally low lying but within range of normal limits. Mild deformity of the ventral aspect of the pons to the left of midline by ectatic vascular appears stable.  Cervical medullary junction, pineal region and orbital structures unremarkable.  IMPRESSION: No acute abnormality.  Please see above.   Electronically Signed   By: Chauncey Cruel M.D.   On: 04/15/2013 10:58    EKG Interpretation    Date/Time:  Thursday April 15 2013 07:21:10 EST Ventricular Rate:  85 PR Interval:  138 QRS Duration: 75 QT Interval:  387 QTC Calculation: 460 R Axis:   55 Text Interpretation:  Sinus rhythm Inferior infarct, acute (LCx) Minimal ST elevation, anterior leads Lateral leads are also involved No significant change was found Confirmed by Wyvonnia Dusky   MD, STEPHEN ([id number]) on 04/15/2013 8:06:32 AM            MDM  Orthostasis with near syncope/symptomatic hypotension- Patient presented with headache x 3 weeks with history of fall (details unclear).  He has been afebrile, no neck pain or stiffness so meningitis unlikely. He has an old right sided facial droop, was diagnosed with Bell's palsy in 07/2012; per note, he was being treated for headaches at that time as well.  Patient is hypotensive at baseline but states that now his severe headache with standing prevents him from walking.  Orthostatics in ED positive, gait very unsteady.  Given ocular symptoms, performed fluorescein stain on right eye, no corneal lesions appreciated.  He does make urine, attended HD [facility]) on 12/2.  Does not meet SIRS criteria. Troponin x 1 negative.  CXR negative. UA pending. CT head negative, MRI brain negative.  Admit to internal medicine.   Ivin Poot, MD 04/15/13 1129  Ivin Poot, MD 04/15/13 [phone]

## 2013-04-15 NOTE — ED Notes (Signed)
MRI called to determine if pt could ride in a wheel chair, this RN stressed the pt was dizzy especially with standing and would need assistance getting up ad a few minutes after sitting or standing to control his dizziness

## 2013-04-15 NOTE — ED Notes (Signed)
Patient transported to CT 
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
## 2013-04-15 NOTE — ED Provider Notes (Signed)
I saw and evaluated the patient, reviewed the resident's note and I agree with the findings and plan. If applicable, I agree with the resident's interpretation of the EKG.  If applicable, I was present for critical portions of any procedures performed.  past medical history of ESRD on TTS dialysis, hepatitis C & HIV-not on treatment for approximately a year, DM.  Headache x 3 weeks after fall.  Known to have frequent syncope and hypotension. R nasolabial fold flattening.  5/5 strength throughout. No fever.  No meningismus.  CD4 300 in February  Conjunctivitis on R with clear drainage, no dendritic lesions. +Romberg, ataxic gait.    Ezequiel Essex, MD 04/15/13 [phone]

## 2013-04-15 NOTE — ED Notes (Signed)
Per [facility] pt was at dialysis center this am c/o headache and dizziness, pt fell but unsure of the actual date. Pt's BP en route was 80/61. Pt alert and oriented, ambulatory to stretcher

## 2013-04-15 NOTE — ED Notes (Signed)
Patient transported to MRI 

## 2013-04-15 NOTE — Progress Notes (Signed)
Dr. Jimmy Footman called and informed of pts BP running in the 50-60's. Pt is asymptomatic. He is awake alert and oriented and does not complain of feeling bad and is watching TV. No changes made at this time. States he would like BP to be a minimum of 55 as long as he was not having any symptoms.

## 2013-04-15 NOTE — Progress Notes (Signed)
Pt ran 4 hour hemodialysis Tx. Order was to keep pt even. Pt Ran a very low BP during The Tx (SBP 50-60). Post Tx BP 85. Pt was asymptomatic during Tx.

## 2013-04-15 NOTE — ED Notes (Addendum)
hospitalist and nephrology MD at bedside.

## 2013-04-15 NOTE — Progress Notes (Signed)
Received Pt assignment at 1218. Called ED for report at 1234. Pt arrived to unit at 1318.  Pt mental status is A&Ox4. Pt oriented to room, staff, and call bell. Full assessment charted in [facility]. Call bell within reach.

## 2013-04-15 NOTE — ED Notes (Signed)
Pt is still in MRI.

## 2013-04-15 NOTE — Procedures (Signed)
I was present at this session.  I have reviewed the session itself and made appropriate changes.Low bp, access press ok. tol low bp , chronic.    Alvin Daniels L 12/4/20147:42 PM

## 2013-04-15 NOTE — ED Notes (Addendum)
Pt speaks minimal english, interpreter [phone] used for translation: Pt reports falling 3 weeks ago, since falling he has been experiencing dizziness upon standing, unable to stand too long, unsteady gait, posterior headache, blurred vision to Right eye, and sensitivity to light.  Pt presents with a Right side facial droop, significant amount of green drainage noted from Right eye, a bruise and some abrasion noted above the forehead that has scabbed over.  Pt recently diagnosed with Bells Palsy  Pt also receives dialysis T/Th/Sat, he did complete dialysis on Tuesday but was sent out from dialysis center today prior to receiving treatment

## 2013-04-15 NOTE — Consult Note (Addendum)
INFECTIOUS DISEASE CONSULT NOTE  Date of Admission:  04/15/2013  Date of Consult:  04/15/2013  Reason for Consult: AIDS Referring Physician: Algis Liming  Impression/Recommendation AIDS ESRD Diarrhea L elbow wound  would recheck his cd4 and hiv rna.  Check stool studies Wound care Start Hep B series Check RPR, gc/chlamydia Check lipid panel  I have spoken with HIV case mgmt ([facility]) and they are aware that he is out of care and will work with [place] to get him back into the clinic and on medications.  He was previously on DRVr/EMT/TFV but took them irregularly. Will check a genotype as well as a HLA [id number].  Comment- Thank you so much for letting us know he was in house so we can try to get him back into care.    Thank you so much for this interesting consult,   [name] (pager) [phone] [email or web address]  Alvin Daniels is an 42 y.o. male.  HPI: 42 yo M with hx of AIDS, ESRD, chronic hypotension, DM, he has been off ART for 1 yr. He came to ED 12-4 with hypotension, chronic wound on his L elbow related to a fall.    Past Medical History  Diagnosis Date  . ESRD (end stage renal disease) on dialysis   . Hepatitis C   . Dialysis patient   . MRSA infection   . Pancreatitis   . Chronic diarrhea   . HIV INFECTION 06/27/2010    Qualifier: Diagnosis of  By: Nickola Major CMA ( [place]), Geni Bers    . Hypotension 06/02/2012  . Metabolic bone disease [id number]  . Normocytic anemia 06/17/2012  . Severe protein-calorie malnutrition 06/17/2012  . DM 06/27/2010    Annotation: uncontrolled Qualifier: Diagnosis of  By: Nickola Major CMA ( AAMA), Geni Bers    . [id number])     Past Surgical History  Procedure Laterality Date  . Av fistula placement Left      No Known Allergies  Medications:  Scheduled: . doxercalciferol  1 mcg Intravenous Q T,Th,Sa-HD  . heparin  5,000 Units Subcutaneous Q8H  . insulin aspart protamine- aspart  10 Units Subcutaneous BID  WC  . sodium chloride  3 mL Intravenous Q12H    Total days of antibiotics: 0          Social History:  reports that he has never smoked. He has never used smokeless tobacco. He reports that he does not drink alcohol or use illicit drugs.  Family History  Problem Relation Age of Onset  . Diabetes Mother   . Diabetes Father     General ROS: c/o headaches. worsening vision. no sob, no cough. does not make urine. +diarrhea. no problems with AVF. no f/c. see HPI.   Blood pressure 70/44, pulse 82, temperature 97.3 F (36.3 C), temperature source Oral, resp. rate 18, height 5' 2.99" (1.6 m), weight 50.621 kg (111 lb 9.6 oz), SpO2 100.00%. General appearance: alert, cooperative and no distress Eyes: he has transient episodes of disconjugate gaze, lid lag.  Throat: normal findings: oropharynx pink & moist without lesions or evidence of thrush Neck: no adenopathy and supple, symmetrical, trachea midline Lungs: clear to auscultation bilaterally Heart: regular rate and rhythm Abdomen: normal findings: bowel sounds normal and soft, non-tender Extremities: edema none and L elbow with 0.5 x 1 cm wound. no d/c, no tenderness. RUE AVF is non-tender, + bruit.    Results for orders placed during the hospital encounter of 04/15/13 (from the past 48 hour(s))  CBC WITH DIFFERENTIAL  Status: Abnormal   Collection Time    04/15/13  7:50 AM      Result Value Range   WBC 7.7  4.0 - 10.5 K/uL   RBC 3.98 (*) 4.22 - 5.81 MIL/uL   Hemoglobin 12.9 (*) 13.0 - 17.0 g/dL   HCT 36.6 (*) 39.0 - 52.0 %   MCV 92.0  78.0 - 100.0 fL   MCH 32.4  26.0 - 34.0 pg   MCHC 35.2  30.0 - 36.0 g/dL   RDW 12.9  11.5 - 15.5 %   Platelets 223  150 - 400 K/uL   Neutrophils Relative % 60  43 - 77 %   Neutro Abs 4.7  1.7 - 7.7 K/uL   Lymphocytes Relative 28  12 - 46 %   Lymphs Abs 2.2  0.7 - 4.0 K/uL   Monocytes Relative 8  3 - 12 %   Monocytes Absolute 0.6  0.1 - 1.0 K/uL   Eosinophils Relative 3  0 - 5 %    Eosinophils Absolute 0.2  0.0 - 0.7 K/uL   Basophils Relative 0  0 - 1 %   Basophils Absolute 0.0  0.0 - 0.1 K/uL  COMPREHENSIVE METABOLIC PANEL     Status: Abnormal   Collection Time    04/15/13  7:50 AM      Result Value Range   Sodium 134 (*) 135 - 145 mEq/L   Potassium 3.4 (*) 3.5 - 5.1 mEq/L   Chloride 95 (*) 96 - 112 mEq/L   CO2 21  19 - 32 mEq/L   Glucose, Bld 123 (*) 70 - 99 mg/dL   BUN 41 (*) 6 - 23 mg/dL   Creatinine, Ser 10.06 (*) 0.50 - 1.35 mg/dL   Calcium 8.7  8.4 - 10.5 mg/dL   Total Protein 9.6 (*) 6.0 - 8.3 g/dL   Albumin 3.6  3.5 - 5.2 g/dL   AST 51 (*) 0 - 37 U/L   ALT 63 (*) 0 - 53 U/L   Alkaline Phosphatase 112  39 - 117 U/L   Total Bilirubin 0.4  0.3 - 1.2 mg/dL   GFR calc non Af Amer 6 (*) >90 mL/min   GFR calc Af Amer 6 (*) >90 mL/min   Comment: (NOTE)     The eGFR has been calculated using the CKD EPI equation.     This calculation has not been validated in all clinical situations.     eGFR's persistently <90 mL/min signify possible Chronic Kidney     Disease.  TROPONIN I     Status: None   Collection Time    04/15/13  7:50 AM      Result Value Range   Troponin I <0.30  <0.30 ng/mL   Comment:            Due to the release kinetics of cTnI,     a negative result within the first hours     of the onset of symptoms does not rule out     myocardial infarction with certainty.     If myocardial infarction is still suspected,     repeat the test at appropriate intervals.      Component Value Date/Time   SDES STOOL 06/20/2012 1151   SDES STOOL 06/20/2012 [place] 06/20/2012 [place] 06/20/2012 [place] 06/20/2012 1151   [place] 06/20/2012 [place] 06/20/2012 [place] 06/20/2012 1151  CULT NO SALMONELLA, SHIGELLA, CAMPYLOBACTER, YERSINIA, OR E.COLI 0157:[id number] ISOLATED 06/20/2012 1151   REPTSTATUS 06/24/2012 FINAL 06/20/2012 1151   REPTSTATUS 06/23/2012 FINAL 06/20/2012 1151   REPTSTATUS 06/22/2012  FINAL 06/20/2012 1151   REPTSTATUS 06/24/2012 FINAL 06/20/2012 1151   Dg Chest 2 View  04/15/2013   CLINICAL DATA:  Dizziness and facial droop  EXAM: CHEST  2 VIEW  COMPARISON:  Chest CT June 26, 2012 and chest radiograph June 25, 2012  FINDINGS: Lungs are clear. Heart size and pulmonary vascularity are normal. No adenopathy. No bone lesions.  IMPRESSION: No abnormality noted.   Electronically Signed   By: Lowella Grip M.D.   On: 04/15/2013 10:42   Ct Head Wo Contrast  04/15/2013   CLINICAL DATA:  Headache and dizziness status post fall.  EXAM: CT HEAD WITHOUT CONTRAST  TECHNIQUE: Contiguous axial images were obtained from the base of the skull through the vertex without intravenous contrast.  COMPARISON:  MRI of the brain dated August 06, 2012.  FINDINGS: The ventricles are normal in size and position. There is no intracranial hemorrhage nor intracranial mass effect. There is no evidence of an evolving ischemic infarction. The cerebellum and brainstem are normal in density.  At bone window settings the observed portions of the paranasal sinuses and mastoid air cells are clear. There is no evidence of an acute skull fracture.  IMPRESSION: Normal noncontrast CT scan of the brain.   Electronically Signed   By: David  Martinique   On: 04/15/2013 08:29   Mr Brain Wo Contrast  04/15/2013   CLINICAL DATA:  Acute onset of dizziness and headache post fall. End-stage renal disease. Diabetic. HIV infection. Hepatitis-C.  EXAM: MRI HEAD WITHOUT CONTRAST  TECHNIQUE: Multiplanar, multiecho pulse sequences of the brain and surrounding structures were obtained without intravenous contrast.  COMPARISON:  04/15/2013 CT.  06/26/2012 MR.  FINDINGS: No acute infarct.  No intracranial hemorrhage.  No hydrocephalus.  No intracranial mass lesion noted on this unenhanced exam.  Small left vertebral artery as noted on prior exam. Major intracranial vascular structures are patent.  Abnormal bone marrow may reflect changes of  chronic disease/ anemia and noted previously.  Cerebellar tonsils minimally low lying but within range of normal limits. Mild deformity of the ventral aspect of the pons to the left of midline by ectatic vascular appears stable.  Cervical medullary junction, pineal region and orbital structures unremarkable.  IMPRESSION: No acute abnormality.  Please see above.   Electronically Signed   By: Chauncey Cruel M.D.   On: 04/15/2013 10:58   No results found for this or any previous visit (from the past 240 hour(s)).    04/15/2013, 2:53 PM     LOS: 0 days

## 2013-04-15 NOTE — ED Notes (Addendum)
Pt returned from MRI and XR, placed back on cardiac monitor, BP, and continuous pulse ox. Pt informed of need for UA sample

## 2013-04-15 NOTE — Consult Note (Signed)
[name] Renal Consultation Note  Indication for Consultation:  Management of ESRD/hemodialysis; anemia, hypertension/volume and secondary hyperparathyroidism  HPI: Alvin Daniels is a 42 y.o. male with HIV, Hepatitis C, Diabetes Type 2, and ESRD on dialysis on TTS at the [facility] who presented to the ED today with frontal headaches and associated blurred vision for several weeks.  His symptoms usually occur when standing and resolve when he sits down, and he denies any syncopal episodes, but has a long history of chronic hypotension and often has dizziness post-dialysis.  He has a large hematoma on his frontal scalp which, he states, is secondary to a fall two months ago.  He also continues to have chronic post-prandial diarrhea, which has been evaluated during previous hospitalizations.  He currently takes only insulin for his diabetes and has not had antiviral medications in the last year.  MRI of the brain today showed no acute abnormality, but he will be admitted for further evaluation and will receive dialysis today.  Dialysis Orders:  TTS @ [facility] 4 hrs    53.5 kg    400/A1.5    2K/2.25Ca     Heparin 5300 U     AVF @ LFA    Hectorol 1 mcg     Epogen 0      Venofer 0    Past Medical History  Diagnosis Date  . ESRD (end stage renal disease) on dialysis   . Hepatitis C   . Dialysis patient   . MRSA infection   . Pancreatitis   . Chronic diarrhea   . DM 06/27/2010    Annotation: uncontrolled Qualifier: Diagnosis of  By: Nickola Major CMA ( AAMA), Geni Bers    . HIV INFECTION 06/27/2010    Qualifier: Diagnosis of  By: Nickola Major CMA ( [place]), Geni Bers    . Hypotension 06/02/2012  . Metabolic bone disease [id number]  . Normocytic anemia 06/17/2012  . Severe protein-calorie malnutrition 06/17/2012   Past Surgical History  Procedure Laterality Date  . No past surgeries     Family History  Problem Relation Age of Onset  . Diabetes Mother   . Diabetes Father     Social History  He denies any history of tobacco, alcohol, or illicit drug use.  No Known Allergies Prior to Admission medications   Medication Sig Start Date End Date Taking? Authorizing Provider  insulin aspart protamine- aspart (NOVOLOG MIX 70/30) (70-30) 100 UNIT/ML injection Inject 15-20 Units into the skin See admin instructions. Takes 15 units in the morning, and 20 units at bedtime   Yes Historical Provider, MD   Labs:  Results for orders placed during the hospital encounter of 04/15/13 (from the past 48 hour(s))  CBC WITH DIFFERENTIAL     Status: Abnormal   Collection Time    04/15/13  7:50 AM      Result Value Range   WBC 7.7  4.0 - 10.5 K/uL   RBC 3.98 (*) 4.22 - 5.81 MIL/uL   Hemoglobin 12.9 (*) 13.0 - 17.0 g/dL   HCT 36.6 (*) 39.0 - 52.0 %   MCV 92.0  78.0 - 100.0 fL   MCH 32.4  26.0 - 34.0 pg   MCHC 35.2  30.0 - 36.0 g/dL   RDW 12.9  11.5 - 15.5 %   Platelets 223  150 - 400 K/uL   Neutrophils Relative % 60  43 - 77 %   Neutro Abs 4.7  1.7 - 7.7 K/uL   Lymphocytes Relative 28  12 -  46 %   Lymphs Abs 2.2  0.7 - 4.0 K/uL   Monocytes Relative 8  3 - 12 %   Monocytes Absolute 0.6  0.1 - 1.0 K/uL   Eosinophils Relative 3  0 - 5 %   Eosinophils Absolute 0.2  0.0 - 0.7 K/uL   Basophils Relative 0  0 - 1 %   Basophils Absolute 0.0  0.0 - 0.1 K/uL  COMPREHENSIVE METABOLIC PANEL     Status: Abnormal   Collection Time    04/15/13  7:50 AM      Result Value Range   Sodium 134 (*) 135 - 145 mEq/L   Potassium 3.4 (*) 3.5 - 5.1 mEq/L   Chloride 95 (*) 96 - 112 mEq/L   CO2 21  19 - 32 mEq/L   Glucose, Bld 123 (*) 70 - 99 mg/dL   BUN 41 (*) 6 - 23 mg/dL   Creatinine, Ser 10.06 (*) 0.50 - 1.35 mg/dL   Calcium 8.7  8.4 - 10.5 mg/dL   Total Protein 9.6 (*) 6.0 - 8.3 g/dL   Albumin 3.6  3.5 - 5.2 g/dL   AST 51 (*) 0 - 37 U/L   ALT 63 (*) 0 - 53 U/L   Alkaline Phosphatase 112  39 - 117 U/L   Total Bilirubin 0.4  0.3 - 1.2 mg/dL   GFR calc non Af Amer 6 (*) >90 mL/min    GFR calc Af Amer 6 (*) >90 mL/min   Comment: (NOTE)     The eGFR has been calculated using the CKD EPI equation.     This calculation has not been validated in all clinical situations.     eGFR's persistently <90 mL/min signify possible Chronic Kidney     Disease.  TROPONIN I     Status: None   Collection Time    04/15/13  7:50 AM      Result Value Range   Troponin I <0.30  <0.30 ng/mL   Comment:            Due to the release kinetics of cTnI,     a negative result within the first hours     of the onset of symptoms does not rule out     myocardial infarction with certainty.     If myocardial infarction is still suspected,     repeat the test at appropriate intervals.   Constitutional: negative for chills, fatigue, fevers and sweats Eyes: blurred vision when standing Ears, nose, mouth, throat, and face: negative for earaches, hoarseness, nasal congestion and sore throat Respiratory: negative for cough, dyspnea on exertion, hemoptysis and sputum Cardiovascular: negative for chest pain, chest pressure/discomfort, dyspnea, orthopnea and palpitations Gastrointestinal: positive for post-prandial diarrhea, negative for abdominal pain, nausea and vomiting Genitourinary:negative, oliguric Musculoskeletal:negative for arthralgias, back pain, myalgias and neck pain Neurological: positive for dizziness and headaches, negative for gait problems, seizures and speech problems  Physical Exam: Filed Vitals:   04/15/13 1230  BP: 92/58  Pulse: 84  Temp:   Resp: 15     General appearance: alert, cooperative and no distress Head: Normocephalic, without obvious abnormality, large central hematoma with abrasion on frontal scalp  Neck: no adenopathy, no carotid bruit, no JVD and supple, symmetrical, trachea midline Resp: clear to auscultation bilaterally Cardio: regular rate and rhythm, S1, S2 normal, no murmur, click, rub or gallop GI: soft, non-tender; bowel sounds normal; no masses,  no  organomegaly Extremities: extremities normal, no cyanosis or edema, burn scarring over  left elbow Neurologic: Grossly normal Dialysis Access: AVF @ LFA with + bruit   Assessment/Plan: 1. Headaches/blurred vision - when standing, resolves with sitting; CT/MRI without acute abnormality.  Admitted for evaluation. 2. Hypotension/Volume - chronic, previously on Midodrine, believed to be orthostatic, often with dizziness, especially post-HD, although usually with even UF.. 3. ESRD - HD on MWF @ [facility]; K 3.4.  HD pending. 4. Anemia - Hgb 12.9, no outpatient Epogen or Fe. 5. Metabolic bone disease - Ca 8.7, last P 5.6, iPTH 152; Hectorol 1 mcg, no binders. 6. Nutrition - renal diet, no vitamin as outpatient. 7. DM Type 2 - on Novolog 20 U qhs, last Hgb A1C 6. 8. HIV - previously received meds free, but has had none in nearly 1 yr. 9. Hepatitis C  LYLES,CHARLES 04/15/2013, 12:59 PM   Attending Nephrologist: Roney Jaffe, MD  I have seen and examined patient, discussed with PA and agree with assessment and plan as outlined above.  42 yo with ESRD on hemodialysis two years, chronic hypotension, HIV, hepatitis C and DM.  Patient presented with HA's and blurry vision. According to staff at op HD, usual BP's on presentation are in the XX123456 range systolic, pt is usually treated with IVF's 300-500 cc for BP support during HD. Never comes in with fluid excess; perhaps chronic diarrhea explains this in part.  He has difficulty affording medication and per patient the only thing he can buy is his insulin.  According to op HD staff they have tried "everything" to try to get help with getting medications paid for. Currently stable in ED with BP 90/50 after NS bolus, alert, nonfocal neuro exam, large hematoma/bruise on top of head.  Plan HD tomorrow, will follow.  Kelly Splinter MD pager [phone]    cell [phone] 04/15/2013, 1:30 PM

## 2013-04-15 NOTE — ED Notes (Signed)
MD at bedside. 

## 2013-04-16 DIAGNOSIS — E43 Unspecified severe protein-calorie malnutrition: Secondary | ICD-10-CM

## 2013-04-16 DIAGNOSIS — B20 Human immunodeficiency virus [HIV] disease: Secondary | ICD-10-CM

## 2013-04-16 LAB — GLUCOSE, CAPILLARY
Glucose-Capillary: 90 mg/dL (ref 70–99)
Glucose-Capillary: 159 mg/dL — ABNORMAL HIGH (ref 70–99)
Glucose-Capillary: 161 mg/dL — ABNORMAL HIGH (ref 70–99)
Glucose-Capillary: 90 mg/dL (ref 70–99)

## 2013-04-16 LAB — T-HELPER CELLS (CD4) COUNT (NOT AT ARMC)
CD4 % Helper T Cell: 12 % — ABNORMAL LOW (ref 33–55)
CD4 T Cell Abs: 260 /uL — ABNORMAL LOW (ref 400–2700)

## 2013-04-16 LAB — RPR: RPR Ser Ql: NONREACTIVE

## 2013-04-16 LAB — HEPATITIS B SURFACE ANTIGEN: Hepatitis B Surface Ag: NEGATIVE

## 2013-04-16 LAB — HIV-1 RNA ULTRAQUANT REFLEX TO GENTYP+
HIV 1 RNA Quant: 1705 copies/mL — ABNORMAL HIGH (ref <20)
HIV-1 RNA Quant, Log: 3.23 {Log} — ABNORMAL HIGH (ref <1.30)

## 2013-04-16 MED ORDER — SODIUM CHLORIDE 0.9 % IV BOLUS (SEPSIS)
500.0000 mL | Freq: Once | INTRAVENOUS | Status: AC
  Administered 2013-04-16: 500 mL via INTRAVENOUS

## 2013-04-16 MED ORDER — MIDODRINE HCL 5 MG PO TABS
5.0000 mg | ORAL_TABLET | Freq: Two times a day (BID) | ORAL | Status: DC
  Administered 2013-04-16 – 2013-04-17 (×2): 5 mg via ORAL
  Filled 2013-04-16 (×5): qty 1

## 2013-04-16 NOTE — Progress Notes (Signed)
Pt BP low stable with systolic ranging in the 0000000 and the diastolic in the [id number].  571mL bolus given earlier in the shift with short term improvement noted.  1700 BP 64/37.  MD notified.  First dose of previously ordered midodrine given.  Pt stable.  A&O x4.  Pt ambulates independently with no symptomatic complaints.  Will continue to monitor closely.  Pt refusing bed/chair alarms as he is independent and stable.    On review noted pt off monitor starting at 16:54.  RN first notification by phone call at 17:06.  RN across hall but pt visualized sitting up on side of bed.  When first able to place pt on monitor he was in bathroom and verbalized he was ok.  I received another phone call from central monitoring that pt continued to be off monitor at 17:28.  Pt again in bathroom and again checked on pt where he verbalized that he was ok.  Pt out of bathroom and placed back on monitor at 17:38pm.  Pt continues to be stable and NSR once returned to the monitor.  Vickie Epley RN

## 2013-04-16 NOTE — Progress Notes (Signed)
INFECTIOUS DISEASE PROGRESS NOTE  ID: Alvin Daniels is a 42 y.o. male with  Principal Problem:   Orthostatic hypotension Active Problems:   HIV INFECTION   DIABETES MELLITUS, TYPE II, UNCONTROLLED, W/RENAL COMPS   RENAL FAILURE, END STAGE   Hepatitis C carrier   Polyneuropathy in diabetes(357.2)   Syncope and collapse   Chronic diarrhea   Anemia of chronic renal failure   Orthostatic headache  Subjective: C/io headaches, feeling poorly  Abtx:  Anti-infectives   None      Medications:  Scheduled: . doxercalciferol  1 mcg Intravenous Q T,Th,Sa-HD  . feeding supplement (RESOURCE BREEZE)  1 Container Oral Q24H  . heparin  5,000 Units Subcutaneous Q8H  . insulin aspart protamine- aspart  10 Units Subcutaneous BID WC  . midodrine  5 mg Oral BID WC  . sodium chloride  3 mL Intravenous Q12H    Objective: Vital signs in last 24 hours: Temp:  [97.3 F (36.3 C)-98.8 F (37.1 C)] 98.8 F (37.1 C) (12/05 0935) Pulse Rate:  [81-116] 93 (12/05 0935) Resp:  [12-26] 20 (12/05 0935) BP: (55-85)/(29-60) 60/38 mmHg (12/05 1056) SpO2:  [97 %-100 %] 98 % (12/05 0935) Weight:  [50.6 kg (111 lb 8.8 oz)-51.5 kg (113 lb 8.6 oz)] 51.5 kg (113 lb 8.6 oz) (12/04 2130)   General appearance: alert, cooperative and no distress Resp: clear to auscultation bilaterally Cardio: regular rate and rhythm GI: abnormal findings:  BS+, soft , non-tender  Lab Results  Recent Labs  04/15/13 0750  WBC 7.7  HGB 12.9*  HCT 36.6*  NA 134*  K 3.4*  CL 95*  CO2 21  BUN 41*  CREATININE 10.06*   Liver Panel  Recent Labs  04/15/13 0750  PROT 9.6*  ALBUMIN 3.6  AST 51*  ALT 63*  ALKPHOS 112  BILITOT 0.4   Sedimentation Rate No results found for this basename: ESRSEDRATE,  in the last 72 hours C-Reactive Protein No results found for this basename: CRP,  in the last 72 hours  Microbiology: Recent Results (from the past 240 hour(s))  MRSA PCR SCREENING     Status: None   Collection Time    04/15/13  2:59 PM      Result Value Range Status   MRSA by PCR NEGATIVE  NEGATIVE Final   Comment:            The GeneXpert MRSA Assay (FDA     approved for NASAL specimens     only), is one component of a     comprehensive MRSA colonization     surveillance program. It is not     intended to diagnose MRSA     infection nor to guide or     monitor treatment for     MRSA infections.    Studies/Results: Dg Chest 2 View  04/15/2013   CLINICAL DATA:  Dizziness and facial droop  EXAM: CHEST  2 VIEW  COMPARISON:  Chest CT June 26, 2012 and chest radiograph June 25, 2012  FINDINGS: Lungs are clear. Heart size and pulmonary vascularity are normal. No adenopathy. No bone lesions.  IMPRESSION: No abnormality noted.   Electronically Signed   By: Lowella Grip M.D.   On: 04/15/2013 10:42   Ct Head Wo Contrast  04/15/2013   CLINICAL DATA:  Headache and dizziness status post fall.  EXAM: CT HEAD WITHOUT CONTRAST  TECHNIQUE: Contiguous axial images were obtained from the base of the skull through the vertex without intravenous contrast.  COMPARISON:  MRI of the brain dated August 06, 2012.  FINDINGS: The ventricles are normal in size and position. There is no intracranial hemorrhage nor intracranial mass effect. There is no evidence of an evolving ischemic infarction. The cerebellum and brainstem are normal in density.  At bone window settings the observed portions of the paranasal sinuses and mastoid air cells are clear. There is no evidence of an acute skull fracture.  IMPRESSION: Normal noncontrast CT scan of the brain.   Electronically Signed   By: David  Martinique   On: 04/15/2013 08:29   Mr Brain Wo Contrast  04/15/2013   CLINICAL DATA:  Acute onset of dizziness and headache post fall. End-stage renal disease. Diabetic. HIV infection. Hepatitis-C.  EXAM: MRI HEAD WITHOUT CONTRAST  TECHNIQUE: Multiplanar, multiecho pulse sequences of the brain and surrounding structures were  obtained without intravenous contrast.  COMPARISON:  04/15/2013 CT.  06/26/2012 MR.  FINDINGS: No acute infarct.  No intracranial hemorrhage.  No hydrocephalus.  No intracranial mass lesion noted on this unenhanced exam.  Small left vertebral artery as noted on prior exam. Major intracranial vascular structures are patent.  Abnormal bone marrow may reflect changes of chronic disease/ anemia and noted previously.  Cerebellar tonsils minimally low lying but within range of normal limits. Mild deformity of the ventral aspect of the pons to the left of midline by ectatic vascular appears stable.  Cervical medullary junction, pineal region and orbital structures unremarkable.  IMPRESSION: No acute abnormality.  Please see above.   Electronically Signed   By: Chauncey Cruel M.D.   On: 04/15/2013 10:58     Assessment/Plan: AIDS  ESRD  Diarrhea  Total days of antibiotics: 0 Met with CM yesterday to help get him back in ART HIV RNA, genotype, HLA testing pending.  Stool studies pending.  Will need help to get back on midodrine as well. Dr Tommy Medal available if questions over w/e           [name] Infectious Diseases (pager) [phone] [email or web address] 04/16/2013, 2:16 PM  LOS: 1 day

## 2013-04-16 NOTE — Progress Notes (Signed)
Subjective:  Sitting up in chair co Hurting all over.( using interp. Line) Denies any worsening pain with hd yesterday. Objective Vital signs in last 24 hours: Filed Vitals:   04/15/13 2134 04/15/13 2215 04/15/13 2325 04/16/13 0533  BP: 67/43 85/56 71/47  63/35  Pulse: 90 98 92 90  Temp:   98.3 F (36.8 C) 98.8 F (37.1 C)  TempSrc:   Oral Oral  Resp: 17 16 16 16   Height:      Weight:      SpO2:   100% 99%   Weight change:   Intake/Output Summary (Last 24 hours) at 04/16/13 0903 Last data filed at 04/16/13 0100  Gross per 24 hour  Intake    243 ml  Output     20 ml  Net    223 ml   Labs: Basic Metabolic Panel:  Recent Labs Lab 04/15/13 0750  NA 134*  K 3.4*  CL 95*  CO2 21  GLUCOSE 123*  BUN 41*  CREATININE 10.06*  CALCIUM 8.7   Liver Function Tests:  Recent Labs Lab 04/15/13 0750  AST 51*  ALT 63*  ALKPHOS 112  BILITOT 0.4  PROT 9.6*  ALBUMIN 3.6  CBC:  Recent Labs Lab 04/15/13 0750  WBC 7.7  NEUTROABS 4.7  HGB 12.9*  HCT 36.6*  MCV 92.0  PLT 223   Cardiac Enzymes:  Recent Labs Lab 04/15/13 0750  TROPONINI <0.30   CBG:  Recent Labs Lab 04/15/13 1616 04/15/13 2318 04/16/13 0800  GLUCAP 302* 90 159*    Medications:   . doxercalciferol      . doxercalciferol  1 mcg Intravenous Q T,Th,Sa-HD  . feeding supplement (RESOURCE BREEZE)  1 Container Oral Q24H  . heparin  5,000 Units Subcutaneous Q8H  . insulin aspart protamine- aspart  10 Units Subcutaneous BID WC  . sodium chloride  3 mL Intravenous Q12H    Physical Exam: General: Alert NAD Heart: RRR, no rub or mur.  Lungs: CTA bilat.  Abdomen: Soft , nontender/  Extremities: Dialysis Access: no pedal edema / pos. Bruit L FA AVF   Dialysis Orders: [facility]  4 hrs 53.5 kg 400/A1.5 2K/2.25Ca Heparin 5300 U AVF @ LFA  Hectorol 1 mcg Epogen 0 Venofer 0  Problem/Plan: 1. Headaches/blurred vision - continued discomfort  Now pain when sitting/CT/MRI without acute abnormality. Admit  team / ID eval  2. Hypotension/Volume - chronic, previously on Midodrine( stopped sec . No insurance) believed to be orthostatic, No uf with hd yesterday and in am/ was asymp  On hd yesterday with low bps/ below his op edw/ Also restart Midodrine at 5mg  bid and taper up if needed. 3. ESRD - HD on TTS [facility]; K 3.4. Yesterday, use added K bath in am 4. Anemia - Hgb 12.9, no outpatient Epogen or Fe. 5. Metabolic bone disease - Ca 8.7, last P 5.6, iPTH 152; Hectorol 1 mcg, no binders. 6. Chronic Diarrhea - no C. Diff . Noted yet 7. Nutrition - ALB. = 3.6 , renal diet, use renavite,  no vitamin as outpatient sec  No insurance 8. DM Type 2 - on Novolog 20 U qhs, last Hgb A1C 6. 9. HIV - ID seeing / problems with him obtaining meds as op despite previously received meds free, but has had none in nearly 1 yr. 10. Hepatitis C 11. Medication noncompliance: pt tells me he has Medicaid, however his facesheet says "Medicaid potential". Perhaps CM can help sort this out, have d/w primary  Ernest Haber, PA-C [facility] Beeper [phone] 04/16/2013,9:03 AM  LOS: 1 day   I have seen and examined patient, discussed with PA and agree with assessment and plan as outlined above with additions as indicated. Kelly Splinter MD pager [phone]    cell [phone] 04/16/2013, 1:44 PM

## 2013-04-16 NOTE — Progress Notes (Signed)
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  Assessment/Plan: Orthostatic hypotension with headache appears to be chronic and worsened now. Likely autonomic neuropathy with DM, AIDS  and ESRD . Was placed on midodrine before but could not get it due to insurance issue. IV fluids as needed. Noted drop in BP to 60s/ 40s this am. -CT head/ MRI brain without any abnormality restarted midodrine   ESRD on TTS HD  -renal following. 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  -continue SSI Check A1C  History of syncope, collapse and head injury  - Likely precipitated by hypotension/orthostatic hypotension  - 2-D echo January 2014 showed LVEF 55-60%.   HIV infection/hepatitis C  -patient had not continue follow up due to insurance issues. ID following and plan to restart him on ART. Follow CD4 of 260.  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 Reports headache worsened on standing with some blurry vision. No chest pain, palpation or DOE. had 2-3 episodes of diarrhea overnight   Objective: Filed  Vitals:   04/16/13 1056  BP: 60/38  Pulse:   Temp:   Resp:     Intake/Output Summary (Last 24 hours) at 04/16/13 1545 Last data filed at 04/16/13 1100  Gross per 24 hour  Intake    480 ml  Output     20 ml  Net    460 ml   Filed Weights   04/15/13 0844 04/15/13 1722 04/15/13 2130  Weight: 50.621 kg (111 lb 9.6 oz) 50.6 kg (111 lb 8.8 oz) 51.5 kg (113 lb 8.6 oz)    Exam:   General:  Middle aged thin built male in NAD   HEENT: no pallor, moist oral mucosa  chest: clear b/l, no added sounds  CVS: NS1&S2, no murmurs, rubs or gallop  Abd: soft, NT, ND, BS+  Ext: warm, no edema   CNS: AAOX3  Data Reviewed: Basic Metabolic Panel:  Recent Labs Lab 04/15/13 0750  NA 134*  K 3.4*  CL 95*  CO2 21  GLUCOSE 123*  BUN 41*  CREATININE 10.06*  CALCIUM 8.7   Liver Function Tests:  Recent Labs Lab 04/15/13 0750  AST 51*  ALT 63*  ALKPHOS 112  BILITOT 0.4  PROT 9.6*  ALBUMIN 3.6   No results found for this basename: LIPASE, AMYLASE,  in the last 168 hours No results found for this basename: AMMONIA,  in the last 168 hours CBC:  Recent Labs Lab 04/15/13 0750  WBC 7.7  NEUTROABS 4.7  HGB 12.9*  HCT 36.6*  MCV 92.0  PLT 223   Cardiac Enzymes:  Recent Labs Lab 04/15/13 0750  TROPONINI <0.30   BNP (last 3 results) No results found for this basename: PROBNP,  in the last 8760 hours CBG:  Recent Labs Lab 04/15/13 1616 04/15/13 2318 04/16/13 0800 04/16/13 1206  GLUCAP 302* 90 159* 161*    Recent Results (from the past 240 hour(s))  MRSA PCR SCREENING     Status: None   Collection Time    04/15/13  2:59 PM      Result Value Range Status   MRSA by PCR NEGATIVE  NEGATIVE Final   Comment:            The GeneXpert MRSA Assay (FDA     approved for NASAL specimens     only), is one component of a     comprehensive MRSA colonization     surveillance program. It is not     intended to diagnose MRSA     infection nor to guide or     monitor  treatment for     MRSA infections.     Studies: Dg Chest 2 View  04/15/2013   CLINICAL DATA:  Dizziness and facial droop  EXAM: CHEST  2 VIEW  COMPARISON:  Chest CT June 26, 2012 and chest radiograph June 25, 2012  FINDINGS: Lungs are clear. Heart size and pulmonary vascularity are normal. No adenopathy. No bone lesions.  IMPRESSION: No abnormality noted.   Electronically Signed   By: Lowella Grip M.D.   On: 04/15/2013 10:42   Ct Head Wo Contrast  04/15/2013   CLINICAL DATA:  Headache and dizziness status post fall.  EXAM: CT HEAD WITHOUT CONTRAST  TECHNIQUE: Contiguous axial images were obtained from the base of the skull through the vertex without intravenous contrast.  COMPARISON:  MRI of the brain dated August 06, 2012.  FINDINGS: The ventricles are normal in size and position. There is no intracranial hemorrhage nor intracranial mass effect. There is no evidence of an evolving ischemic infarction. The cerebellum and brainstem are normal in density.  At bone window settings the observed portions of the paranasal sinuses and mastoid air cells are clear. There is no evidence of an acute skull fracture.  IMPRESSION: Normal noncontrast CT scan of the brain.   Electronically Signed   By: David  Martinique   On: 04/15/2013 08:29   Mr Brain Wo Contrast  04/15/2013   CLINICAL DATA:  Acute onset of dizziness and headache post fall. End-stage renal disease. Diabetic. HIV infection. Hepatitis-C.  EXAM: MRI HEAD WITHOUT CONTRAST  TECHNIQUE: Multiplanar, multiecho pulse sequences of the brain and surrounding structures were obtained without intravenous contrast.  COMPARISON:  04/15/2013 CT.  06/26/2012 MR.  FINDINGS: No acute infarct.  No intracranial hemorrhage.  No hydrocephalus.  No intracranial mass lesion noted on this unenhanced exam.  Small left vertebral artery as noted on prior exam. Major intracranial vascular structures are patent.  Abnormal bone marrow may reflect changes of chronic disease/  anemia and noted previously.  Cerebellar tonsils minimally low lying but within range of normal limits. Mild deformity of the ventral aspect of the pons to the left of midline by ectatic vascular appears stable.  Cervical medullary junction, pineal region and orbital structures unremarkable.  IMPRESSION: No acute abnormality.  Please see above.   Electronically Signed   By: Chauncey Cruel M.D.   On: 04/15/2013 10:58    Scheduled Meds: . doxercalciferol  1 mcg Intravenous Q T,Th,Sa-HD  . feeding supplement (RESOURCE BREEZE)  1 Container Oral  Q24H  . heparin  5,000 Units Subcutaneous Q8H  . insulin aspart protamine- aspart  10 Units Subcutaneous BID WC  . midodrine  5 mg Oral BID WC  . sodium chloride  3 mL Intravenous Q12H   Continuous Infusions:     Time spent: [place] [place] [facility] Pager [phone] If 7PM-7AM, please contact night-coverage at [email or web address], password [facility] 04/16/2013, 3:45 PM  LOS: 1 day

## 2013-04-16 NOTE — Progress Notes (Signed)
UR completed.  Patient changed to inpatient r/t low BP and requiring IVF bolus

## 2013-04-17 DIAGNOSIS — R197 Diarrhea, unspecified: Secondary | ICD-10-CM

## 2013-04-17 LAB — RENAL FUNCTION PANEL
BUN: 48 mg/dL — ABNORMAL HIGH (ref 6–23)
CO2: 23 mEq/L (ref 19–32)
Calcium: 8.6 mg/dL (ref 8.4–10.5)
Creatinine, Ser: 9.12 mg/dL — ABNORMAL HIGH (ref 0.50–1.35)
Glucose, Bld: 163 mg/dL — ABNORMAL HIGH (ref 70–99)

## 2013-04-17 LAB — CBC
HCT: 33 % — ABNORMAL LOW (ref 39.0–52.0)
Hemoglobin: 11.5 g/dL — ABNORMAL LOW (ref 13.0–17.0)
MCH: 31.8 pg (ref 26.0–34.0)
MCV: 91.2 fL (ref 78.0–100.0)
RBC: 3.62 MIL/uL — ABNORMAL LOW (ref 4.22–5.81)
WBC: 6.5 10*3/uL (ref 4.0–10.5)

## 2013-04-17 LAB — GLUCOSE, CAPILLARY: Glucose-Capillary: 83 mg/dL (ref 70–99)

## 2013-04-17 LAB — HEMOGLOBIN A1C: Mean Plasma Glucose: 131 mg/dL — ABNORMAL HIGH (ref <117)

## 2013-04-17 MED ORDER — ALTEPLASE 2 MG IJ SOLR: 2.0000 mg | Freq: Once | INTRAMUSCULAR | Status: DC | PRN

## 2013-04-17 MED ORDER — MIDODRINE HCL 5 MG PO TABS
ORAL_TABLET | ORAL | Status: AC
  Filled 2013-04-17: qty 2

## 2013-04-17 MED ORDER — NEPRO/CARBSTEADY PO LIQD: 237.0000 mL | ORAL | Status: DC | PRN

## 2013-04-17 MED ORDER — LIDOCAINE HCL (PF) 1 % IJ SOLN: 5.0000 mL | INTRAMUSCULAR | Status: DC | PRN

## 2013-04-17 MED ORDER — SODIUM CHLORIDE 0.9 % IV SOLN: 100.0000 mL | INTRAVENOUS | Status: DC | PRN

## 2013-04-17 MED ORDER — DOXERCALCIFEROL 4 MCG/2ML IV SOLN
INTRAVENOUS | Status: AC
  Filled 2013-04-17: qty 2

## 2013-04-17 MED ORDER — PENTAFLUOROPROP-TETRAFLUOROETH EX AERO: 1.0000 "application " | INHALATION_SPRAY | CUTANEOUS | Status: DC | PRN

## 2013-04-17 MED ORDER — LIDOCAINE-PRILOCAINE 2.5-2.5 % EX CREA: 1.0000 "application " | TOPICAL_CREAM | CUTANEOUS | Status: DC | PRN

## 2013-04-17 MED ORDER — MIDODRINE HCL 5 MG PO TABS
10.0000 mg | ORAL_TABLET | Freq: Three times a day (TID) | ORAL | Status: DC
  Administered 2013-04-17 – 2013-04-22 (×17): 10 mg via ORAL
  Filled 2013-04-17 (×18): qty 2

## 2013-04-17 MED ORDER — HEPARIN SODIUM (PORCINE) 1000 UNIT/ML DIALYSIS: 1000.0000 [IU] | INTRAMUSCULAR | Status: DC | PRN

## 2013-04-17 MED ORDER — HEPARIN SODIUM (PORCINE) 1000 UNIT/ML IJ SOLN
1000.0000 [IU] | Freq: Once | INTRAMUSCULAR | Status: AC
  Administered 2013-04-17: 1000 [IU] via INTRAVENOUS
  Filled 2013-04-17: qty 1

## 2013-04-17 MED ORDER — HYDROCODONE-ACETAMINOPHEN 5-325 MG PO TABS
2.0000 | ORAL_TABLET | Freq: Four times a day (QID) | ORAL | Status: DC | PRN
  Administered 2013-04-17 – 2013-04-21 (×7): 2 via ORAL
  Filled 2013-04-17 (×7): qty 2

## 2013-04-17 NOTE — Progress Notes (Signed)
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   Assessment/Plan:  Orthostatic hypotension with headache  appears to be chronic and worsened now. Likely autonomic neuropathy with DM, AIDS and ESRD . Was placed on midodrine before but could not get it due to insurance issue.  IV fluids as needed. Blood pressure remains low but besides headache he is asymptomatic.  -CT head/ MRI brain without any abnormality  restarted midodrine tid  ESRD on TTS HD  -renal following. HD today. 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  -continue SSI  Check A1C   History of syncope, collapse and head injury  - Likely precipitated by hypotension/orthostatic hypotension  - 2-D echo January 2014 showed LVEF 55-60%.   HIV infection/hepatitis C  -patient had not continue follow up due to insurance issues. ID following and plan to restart him on ART. CD4 of 260. viral load of 1700   Chronic diarrhea  Still reports diarrhea. 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 No visual symptoms. Still has watery diarrhea.  Objective: Filed Vitals:   04/17/13 1230   BP: 70/48  Pulse: 82  Temp:   Resp: 14    Intake/Output Summary (Last 24 hours) at 04/17/13 1303 Last data filed at 04/17/13 0700  Gross per 24 hour  Intake    120 ml  Output      0 ml  Net    120 ml   Filed Weights   04/15/13 2130 04/16/13 2122 04/17/13 1109  Weight: 51.5 kg (113 lb 8.6 oz) 52.3 kg (115 lb 4.8 oz) 51.2 kg (112 lb 14 oz)    Exam: General: Middle aged thin built male in NAD  HEENT: no pallor, moist oral mucosa  chest: clear b/l, no added sounds  CVS: NS1&S2, no murmurs, rubs or gallop  Abd: soft, NT, ND, BS+  Ext: warm, no edema  CNS: AAOX3   Data Reviewed: Basic Metabolic Panel:  Recent Labs Lab 04/15/13 0750 04/17/13 1119  NA 134* 133*  K 3.4* 3.5  CL 95* 95*  CO2 21 23  GLUCOSE 123* 163*  BUN 41* 48*  CREATININE 10.06* 9.12*  CALCIUM 8.7 8.6  PHOS  --  3.9   Liver Function Tests:  Recent Labs Lab 04/15/13 0750 04/17/13 1119  AST 51*  --   ALT 63*  --   ALKPHOS 112  --   BILITOT 0.4  --   PROT 9.6*  --   ALBUMIN 3.6 3.2*   No results found for this basename: LIPASE, AMYLASE,  in the last 168 hours No results found for this basename: AMMONIA,  in the last 168 hours CBC:  Recent  Labs Lab 04/15/13 0750 04/17/13 1119  WBC 7.7 6.5  NEUTROABS 4.7  --   HGB 12.9* 11.5*  HCT 36.6* 33.0*  MCV 92.0 91.2  PLT 223 193   Cardiac Enzymes:  Recent Labs Lab 04/15/13 0750  TROPONINI <0.30   BNP (last 3 results) No results found for this basename: PROBNP,  in the last 8760 hours CBG:  Recent Labs Lab 04/15/13 2318 04/16/13 0800 04/16/13 1206 04/16/13 1630 04/16/13 2120  GLUCAP 90 159* 161* 172* 90    Recent Results (from the past 240 hour(s))  MRSA PCR SCREENING     Status: None   Collection Time    04/15/13  2:59 PM      Result Value Range Status   MRSA by PCR NEGATIVE  NEGATIVE Final   Comment:            The GeneXpert MRSA Assay (FDA     approved for NASAL specimens     only), is one component of a      comprehensive MRSA colonization     surveillance program. It is not     intended to diagnose MRSA     infection nor to guide or     monitor treatment for     MRSA infections.     Studies: No results found.  Scheduled Meds: . doxercalciferol      . doxercalciferol  1 mcg Intravenous Q T,Th,Sa-HD  . feeding supplement (RESOURCE BREEZE)  1 Container Oral Q24H  . heparin  5,000 Units Subcutaneous Q8H  . insulin aspart protamine- aspart  10 Units Subcutaneous BID WC  . midodrine      . midodrine  10 mg Oral TID WC  . sodium chloride  3 mL Intravenous Q12H   Continuous Infusions:     Time spent: 25 minutes    Curtis Uriarte  [facility] Pager [phone]. If 7PM-7AM, please contact night-coverage at [email or web address], password [facility] 04/17/2013, 1:03 PM  LOS: 2 days

## 2013-04-17 NOTE — Progress Notes (Signed)
  [facility] Progress Note    Subjective: Alert co feeling bad all over laying in bed watching TV , tolerated 100% brk , co diarrhea ,only 1 stool seen documented yesterday and none yet today. For HD today   Exam  Blood pressure 87/50, pulse 85, temperature 97.4 F (36.3 C), temperature source Oral, resp. rate 17, height 5' 2.99" (1.6 m), weight 51.2 kg (112 lb 14 oz), SpO2 100.00%. Gen: alert , nad, appears appropriate Heart: RRR, no rub or mur.  Lungs: CTA bilat.  Abdomen: Soft , nontender/  Extremities: Dialysis Access: no pedal edema / pos. Bruit L FA AVF   Dialysis Orders: TTS @ [facility]  4 hrs   53.5kg   400/A1.5 2K/2.25Ca Heparin 5300 U AVF @ LFA  Hectorol 1 mcg Epogen 0 Venofer 0   Problem/Plan:  1. Headaches/blurred vision - CT chest and brain MRI without acute abnormality; per primary, ID  2. Hypotension/Volume - chronic, previously on Midodrine (samples only given and stopped d/t no insurance); on midodrine now 10 tid, below dry wt, no fluid off with HD, NS bolus as needed 3. ESRD - HD on TTS @ [facility]; K 3.4. Yesterday, use added K bath in am 4. Anemia - Hgb 12.9, today pending hd/ no outpatient Epogen or Fe. 5. 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 Chronic Diarrhea - no C. Diff . Noted yet 7. Nutrition - ALB. = 3.6 , renal diet, use renavite, no vitamin as outpatient sec No insurance 8. DM2- per admit 9. HIV - ID seeing / problems with him obtaining meds as op despite previously received HIV meds free, but has had none in nearly 1 yr. 10. Hepatitis C   Ernest Haber, PA-C [facility] [phone] 04/17/2013,8:34 AM  LOS: 2 days    Kelly Splinter MD  pager [phone]    cell [phone]  04/17/2013, 11:37 AM   Recent Labs Lab 04/15/13 0750  NA 134*  K 3.4*  CL 95*  CO2 21  GLUCOSE 123*  BUN 41*  CREATININE 10.06*  CALCIUM 8.7    Recent Labs Lab 04/15/13 0750  AST 51*  ALT 63*  ALKPHOS 112  BILITOT 0.4  PROT  9.6*  ALBUMIN 3.6    Recent Labs Lab 04/15/13 0750  WBC 7.7  NEUTROABS 4.7  HGB 12.9*  HCT 36.6*  MCV 92.0  PLT 223   . doxercalciferol  1 mcg Intravenous Q T,Th,Sa-HD  . feeding supplement (RESOURCE BREEZE)  1 Container Oral Q24H  . heparin  1,000 Units Intravenous Once  . heparin  5,000 Units Subcutaneous Q8H  . insulin aspart protamine- aspart  10 Units Subcutaneous BID WC  . midodrine  10 mg Oral TID WC  . sodium chloride  3 mL Intravenous Q12H     acetaminophen, acetaminophen, albuterol, HYDROcodone-acetaminophen, ondansetron (ZOFRAN) IV, ondansetron

## 2013-04-17 NOTE — Progress Notes (Signed)
04/16/13 20:00 Patient refused to have bed alarm turned on,patient aware of reason for it.Advised to call before getting up.Call bell within reach. Graden Hoshino Joselita,RN

## 2013-04-17 NOTE — Procedures (Signed)
I was present at this dialysis session, have reviewed the session itself and made  appropriate changes   Kelly Splinter MD  pager [phone]    cell [phone]  04/17/2013, 11:50 AM

## 2013-04-18 LAB — HLA B*5701: HLA B 5701: NEGATIVE

## 2013-04-18 MED ORDER — HYDROMORPHONE HCL PF 1 MG/ML IJ SOLN
0.5000 mg | Freq: Four times a day (QID) | INTRAMUSCULAR | Status: DC | PRN
  Administered 2013-04-18 (×2): 0.5 mg via INTRAVENOUS
  Filled 2013-04-18 (×2): qty 1

## 2013-04-18 MED ORDER — DEXTROSE 50 % IV SOLN
INTRAVENOUS | Status: AC
  Administered 2013-04-18: 25 mL
  Filled 2013-04-18: qty 50

## 2013-04-18 NOTE — Progress Notes (Signed)
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   Assessment/Plan:  Orthostatic hypotension with headache  appears to be chronic and worsened now. Likely autonomic neuropathy with DM, AIDS and ESRD . Was placed on midodrine before but could not get it due to insurance issue.  IV fluids as needed. -CT head/ MRI brain without any abnormality  restarted midodrine 10 mg tid. BP improved. -still has severe headache not relieved with vicodin.  Will try low dose dilaudid. -  ESRD on TTS HD  -renal following.  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  -continue SSI  -A1C of 6.2  History of syncope, collapse and head injury  - Likely precipitated by hypotension/orthostatic hypotension  - 2-D echo January 2014 showed LVEF 55-60%.   HIV infection/hepatitis C  -patient had not continue follow up due to insurance issues. ID following and plan to restart him on ART. CD4 of 260. viral load of 1700   Chronic diarrhea  Still reports diarrhea but stool for c diff Pending .  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 No visual symptoms. Still has watery diarrhea.   Objective: Filed  Vitals:   04/18/13 1240  BP: 100/67  Pulse: 82  Temp: 97.9 F (36.6 C)  Resp: 17    Intake/Output Summary (Last 24 hours) at 04/18/13 1502 Last data filed at 04/18/13 1241  Gross per 24 hour  Intake    480 ml  Output      0 ml  Net    480 ml   Filed Weights   04/17/13 1109 04/17/13 1537 04/17/13 2044  Weight: 51.2 kg (112 lb 14 oz) 51.3 kg (113 lb 1.5 oz) 51.299 kg (113 lb 1.5 oz)    Exam: General: Middle aged thin built male in NAD  HEENT: no pallor, moist oral mucosa  chest: clear b/l, no added sounds  CVS: NS1&S2, no murmurs, rubs or gallop  Abd: soft, NT, ND, BS+  Ext: warm, no edema  CNS: AAOX3     Data Reviewed: Basic Metabolic Panel:  Recent Labs Lab 04/15/13 0750 04/17/13 1119  NA 134* 133*  K 3.4* 3.5  CL 95* 95*  CO2 21 23  GLUCOSE 123* 163*  BUN 41* 48*  CREATININE 10.06* 9.12*  CALCIUM 8.7 8.6  PHOS  --  3.9   Liver Function Tests:  Recent Labs Lab 04/15/13 0750 04/17/13 1119  AST 51*  --   ALT 63*  --   ALKPHOS 112  --   BILITOT 0.4  --   PROT 9.6*  --   ALBUMIN 3.6 3.2*   No results found for this basename: LIPASE, AMYLASE,  in the last 168 hours No results found for this  basename: AMMONIA,  in the last 168 hours CBC:  Recent Labs Lab 04/15/13 0750 04/17/13 1119  WBC 7.7 6.5  NEUTROABS 4.7  --   HGB 12.9* 11.5*  HCT 36.6* 33.0*  MCV 92.0 91.2  PLT 223 193   Cardiac Enzymes:  Recent Labs Lab 04/15/13 0750  TROPONINI <0.30   BNP (last 3 results) No results found for this basename: PROBNP,  in the last 8760 hours CBG:  Recent Labs Lab 04/16/13 1206 04/16/13 1630 04/16/13 2120 04/17/13 0759 04/17/13 1652  GLUCAP 161* 172* 90 120* 83    Recent Results (from the past 240 hour(s))  MRSA PCR SCREENING     Status: None   Collection Time    04/15/13  2:59 PM      Result Value Range Status   MRSA by PCR NEGATIVE  NEGATIVE Final   Comment:            The GeneXpert MRSA Assay (FDA     approved for NASAL  specimens     only), is one component of a     comprehensive MRSA colonization     surveillance program. It is not     intended to diagnose MRSA     infection nor to guide or     monitor treatment for     MRSA infections.     Studies: No results found.  Scheduled Meds: . doxercalciferol  1 mcg Intravenous Q T,Th,Sa-HD  . feeding supplement (RESOURCE BREEZE)  1 Container Oral Q24H  . heparin  5,000 Units Subcutaneous Q8H  . insulin aspart protamine- aspart  10 Units Subcutaneous BID WC  . midodrine  10 mg Oral TID WC  . sodium chloride  3 mL Intravenous Q12H   Continuous Infusions:     Time spent: 25 minutes    Alvin Daniels, [place]  [facility] Pager [phone] If 7PM-7AM, please contact night-coverage at [email or web address], password [facility] 04/18/2013, 3:02 PM  LOS: 3 days

## 2013-04-18 NOTE — Progress Notes (Signed)
Hypoglycemic Event  CBG: 66  Treatment: D50 IV 25 mL  Symptoms: None  Follow-up CBG:  CBG Result: 124  Possible Reasons for Event: Inadequate meal intake     Velora Mediate  Remember to initiate Hypoglycemia Order Set & complete

## 2013-04-18 NOTE — Progress Notes (Signed)
[facility]  Progress Note   Subjective: Alert continued co feeling bad all over sitting in bedside chair watching TV ,  co diarrhea  But no stool specimen  documented yet today. Tolerated HD yesterday  Exam Blood pressures, today= 103/65 and 70/41 pulse 85, temperature 98.50F (33.1 C), temperature source Oral, resp. rate 16, height 5' 2.99" (1.6 m), weight 51.3 kg  After hd yesterday, SpO2 98.00%.   Gen: alert , nad, appears appropriate  Heart: RRR, no rub or mur.  Lungs: CTA bilat.  Abdomen: Soft , nontender/  Extremities: Dialysis Access: no pedal edema / pos. Bruit L FA AVF   Dialysis Orders: TTS @ [facility]  4 hrs 53.5kg 400/A1.5 2K/2.25Ca Heparin 5300 U AVF @ LFA  Hectorol 1 mcg Epogen 0 Venofer 0   Problem/Plan:  1. Headaches/blurred vision - CT chest and brain MRI without acute abnormality; per primary, ID  2. Hypotension/Volume - chronic, previously on Midodrine (samples only given and stopped d/t no insurance); on midodrine now 10 tid, below dry wt, no fluid off with HD, NS bolus as needed/ past 24 hr some improvement in BPs with Midodrine started. 3.    ESRD - HD on TTS @ [facility]; K 3.4. Yesterday, use added K bath 4.   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    Chronic Diarrhea - no C. Diff . Noted yet 7.    Nutrition - ALB. = 3.6 , renal diet, use renavite, no vitamin as outpatient sec No insurance 8.     DM2- per admit 9.    HIV - ID seeing / problems with him obtaining meds as op despite previously received HIV meds free, but has had none in nearly 1 yr. 10..Hepatitis C   Ernest Haber, PA-C [facility] Beeper [phone] 04/18/2013,10:06 AM  LOS: 3 days   I have seen and examined patient, discussed with PA and agree with assessment and plan as outlined above. Kelly Splinter MD pager [phone]    cell [phone] 04/18/2013, 1:09 PM     Labs: Basic Metabolic Panel:  Recent  Labs Lab 04/15/13 0750 04/17/13 1119  NA 134* 133*  K 3.4* 3.5  CL 95* 95*  CO2 21 23  GLUCOSE 123* 163*  BUN 41* 48*  CREATININE 10.06* 9.12*  CALCIUM 8.7 8.6  PHOS  --  3.9   Liver Function Tests:  Recent Labs Lab 04/15/13 0750 04/17/13 1119  AST 51*  --   ALT 63*  --   ALKPHOS 112  --   BILITOT 0.4  --   PROT 9.6*  --   ALBUMIN 3.6 3.2*    Recent Labs Lab 04/15/13 0750 04/17/13 1119  WBC 7.7 6.5  NEUTROABS 4.7  --   HGB 12.9* 11.5*  HCT 36.6* 33.0*  MCV 92.0 91.2  PLT 223 193   Cardiac Enzymes:  Recent Labs Lab 04/15/13 0750  TROPONINI <0.30   CBG:  Recent Labs Lab 04/16/13 1206 04/16/13 1630 04/16/13 2120 04/17/13 0759 04/17/13 1652  GLUCAP 161* 172* 90 120* 83   Medications:   . doxercalciferol  1 mcg Intravenous Q T,Th,Sa-HD  . feeding supplement (RESOURCE BREEZE)  1 Container Oral Q24H  . heparin  5,000 Units Subcutaneous Q8H  . insulin aspart protamine- aspart  10 Units Subcutaneous BID WC  . midodrine  10 mg Oral TID WC  . sodium chloride  3  mL Intravenous Q12H

## 2013-04-19 ENCOUNTER — Inpatient Hospital Stay (HOSPITAL_COMMUNITY): Payer: Medicaid Other

## 2013-04-19 DIAGNOSIS — E119 Type 2 diabetes mellitus without complications: Secondary | ICD-10-CM

## 2013-04-19 DIAGNOSIS — R109 Unspecified abdominal pain: Secondary | ICD-10-CM | POA: Diagnosis not present

## 2013-04-19 DIAGNOSIS — Z21 Asymptomatic human immunodeficiency virus [HIV] infection status: Secondary | ICD-10-CM

## 2013-04-19 LAB — GLUCOSE, CAPILLARY
Glucose-Capillary: 78 mg/dL (ref 70–99)
Glucose-Capillary: 129 mg/dL — ABNORMAL HIGH (ref 70–99)

## 2013-04-19 LAB — HEPATIC FUNCTION PANEL
AST: 50 U/L — ABNORMAL HIGH (ref 0–37)
Bilirubin, Direct: 0.2 mg/dL (ref 0.0–0.3)

## 2013-04-19 LAB — LIPASE, BLOOD: Lipase: 78 U/L — ABNORMAL HIGH (ref 11–59)

## 2013-04-19 MED ORDER — INSULIN ASPART PROT & ASPART (70-30 MIX) 100 UNIT/ML ~~LOC~~ SUSP
5.0000 [IU] | Freq: Two times a day (BID) | SUBCUTANEOUS | Status: DC
  Administered 2013-04-20 – 2013-04-22 (×6): 5 [IU] via SUBCUTANEOUS
  Filled 2013-04-19: qty 10

## 2013-04-19 MED ORDER — HYDROMORPHONE HCL PF 1 MG/ML IJ SOLN
1.0000 mg | Freq: Four times a day (QID) | INTRAMUSCULAR | Status: DC | PRN
  Administered 2013-04-19 – 2013-04-21 (×6): 1 mg via INTRAVENOUS
  Filled 2013-04-19 (×6): qty 1

## 2013-04-19 MED ORDER — IOHEXOL 300 MG/ML  SOLN
25.0000 mL | INTRAMUSCULAR | Status: AC
  Administered 2013-04-19 (×2): 25 mL via ORAL

## 2013-04-19 MED ORDER — IOHEXOL 300 MG/ML  SOLN
80.0000 mL | Freq: Once | INTRAMUSCULAR | Status: AC | PRN
  Administered 2013-04-19: 80 mL via INTRAVENOUS

## 2013-04-19 NOTE — Progress Notes (Signed)
Spoke with Dr. Clementeen Graham about the 70/30 dose and the patient not being abe to eat and having low blood sugars. He said to hold dose tonight until the am.

## 2013-04-19 NOTE — Progress Notes (Signed)
INFECTIOUS DISEASE PROGRESS NOTE  ID: Alvin Daniels is a 42 y.o. male with  Principal Problem:   Orthostatic hypotension Active Problems:   HIV INFECTION   DIABETES MELLITUS, TYPE II, UNCONTROLLED, W/RENAL COMPS   RENAL FAILURE, END STAGE   Hepatitis C carrier   Polyneuropathy in diabetes(357.2)   Syncope and collapse   Chronic diarrhea   Anemia of chronic renal failure   Orthostatic headache   Abdominal  pain, other specified site  Subjective: C/o nausea, abd pain. Continues to have loose BM, none today.   Abtx:  Anti-infectives   None      Medications:  Scheduled: . doxercalciferol  1 mcg Intravenous Q T,Th,Sa-HD  . feeding supplement (RESOURCE BREEZE)  1 Container Oral Q24H  . heparin  5,000 Units Subcutaneous Q8H  . insulin aspart protamine- aspart  5 Units Subcutaneous BID WC  . midodrine  10 mg Oral TID WC  . sodium chloride  3 mL Intravenous Q12H    Objective: Vital signs in last 24 hours: Temp:  [97.7 F (36.5 C)-99 F (37.2 C)] 99 F (37.2 C) (12/08 1707) Pulse Rate:  [83-89] 89 (12/08 1707) Resp:  [16-17] 16 (12/08 1707) BP: (90-129)/(53-86) 107/71 mmHg (12/08 1707) SpO2:  [95 %-100 %] 95 % (12/08 1707) Weight:  [52.3 kg (115 lb 4.8 oz)] 52.3 kg (115 lb 4.8 oz) (12/07 2134)   General appearance: alert, cooperative and no distress Resp: clear to auscultation bilaterally Cardio: regular rate and rhythm GI: normal findings: bowel sounds normal and soft, non-tender Extremities: LUE AVF, non-tender, clean.   Lab Results  Recent Labs  04/17/13 1119  WBC 6.5  HGB 11.5*  HCT 33.0*  NA 133*  K 3.5  CL 95*  CO2 23  BUN 48*  CREATININE 9.12*   Liver Panel  Recent Labs  04/17/13 1119  ALBUMIN 3.2*   Sedimentation Rate No results found for this basename: ESRSEDRATE,  in the last 72 hours C-Reactive Protein No results found for this basename: CRP,  in the last 72 hours  Microbiology: Recent Results (from the past 240 hour(s))   MRSA PCR SCREENING     Status: None   Collection Time    04/15/13  2:59 PM      Result Value Range Status   MRSA by PCR NEGATIVE  NEGATIVE Final   Comment:            The GeneXpert MRSA Assay (FDA     approved for NASAL specimens     only), is one component of a     comprehensive MRSA colonization     surveillance program. It is not     intended to diagnose MRSA     infection nor to guide or     monitor treatment for     MRSA infections.    Studies/Results: Ct Abdomen Pelvis W Contrast  04/19/2013   CLINICAL DATA:  Abdominal pain.  Tenderness.  EXAM: CT ABDOMEN AND PELVIS WITH CONTRAST  TECHNIQUE: Multidetector CT imaging of the abdomen and pelvis was performed using the standard protocol following bolus administration of intravenous contrast.  CONTRAST:  44mL OMNIPAQUE IOHEXOL 300 MG/ML  SOLN  COMPARISON:  CT 09/28/2009.  Renal ultrasound 03/14/2010.  FINDINGS: Lung Bases: Lingular scarring or atelectasis.  Liver: Mild respiratory motion is present on the abdominal portion of the examination. Study is mildly degraded because of respiratory motion. Minimal intrahepatic biliary ductal dilation is present. This can be associated with sphincter of Oddi dysfunction. No obstructing  lesion is identified. Negative for hepatic cirrhosis.  Spleen:  Normal.  Gallbladder:  Distended.  Common bile duct: No common duct stone is identified. The common bile duct is enlarged, measuring up to 9 mm.  Pancreas: Mild dilation of the pancreatic duct measuring up to 3 mm. No pancreatic mass lesions.  Adrenal glands:  Normal.  Kidneys: Renal atrophy compared to the prior exam. 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 No ureteral calculi are identified. Calcification present adjacent to the mid left ureter probably represents phleboliths in the adjacent gonadal vein.  Stomach: Patulous gastroesophageal junction. No inflammatory changes of the stomach.  Small bowel:  Within normal limits.   No mesenteric adenopathy.  Colon:   Normal appendix.  Redundant sigmoid.  Pelvic Genitourinary: Urinary bladder decompressed with mild mural thickening, likely secondary to collapse.  Bones:  No aggressive osseous lesions.  Vasculature: Normal aside from mild atherosclerosis.  Body Wall: Normal.  IMPRESSION: 1. Mild intrahepatic biliary ductal dilation with extra hepatic biliary ductal dilation and mild pancreatic ductal dilation. In this patient with HIV, this could be associated with HIV cholangiopathy or biliary sphincter dysfunction. If bilirubin is normal, no further evaluation is likely warranted. 2. Development of renal atrophy and medullary nephrocalcinosis. This has a large differential diagnosis ranging from renal tubular acidosis to medullary sponge kidney.   Electronically Signed   By: Dereck Ligas M.D.   On: 04/19/2013 16:03     Assessment/Plan: HIV+ Chronic Hypotension Chronic Diarrhea DM2  Diarrhea (stool studies -)  Total days of antibiotics: 0  Loose BM somewhat better.  Getting phenergan currently.  He also c/o hiccoughs but has none during exam/interview.  Thanks to Ms Royal for getting him appt in our clinic at d/c.   HIV 1 RNA Quant (copies/mL)  Date Value  04/15/2013 1705*  06/17/2012 4332*  03/29/2011 11400*     CD4 T Cell Abs (/uL)  Date Value  04/15/2013 260*  06/17/2012 300*  01/11/2011 390*          Alvin Daniels Infectious Diseases (pager) [phone] [email or web address] 04/19/2013, 5:12 PM  LOS: 4 days

## 2013-04-19 NOTE — Progress Notes (Signed)
[facility] PROGRESS NOTE  [name] [id number] DOB: July 14, 1970 DOA: 04/15/2013 PCP: Lucrezia Starch, MD  Brief narrative  42 y/o hispanic pame with HIV AIDS ( not on medication due to lack of follow up by patient, hepc , DM with neuropathy, chr diarrhea, hypotension, rt bell's palsy, ESRD on HD (Tu, th sat) presented to ED with headache worsened on standing progressive for several weeks.   Assessment/Plan:  Orthostatic hypotension with headache  appears to be chronic and worsened now. Likely autonomic neuropathy with DM, AIDS and ESRD . Was placed on midodrine before but could not get it due to insurance issue.  IV fluids as needed. -CT head/ MRI brain without any abnormality  restarted midodrine 10 mg tid. BP improved. His headache has improved now.  -  ESRD on TTS HD  -renal following. Getting hectoral   Abdominal pain Patient reports diffuse abdominal pain for possible days and has been nauseous. He also reports one episode of vomiting yesterday. Tender to exam. No further diarrhea. Followup in CT scan of the abdomen and pelvis to rule out any colitis.  DM 2 with neuropathy and renal complications  -[id number] of 6.2  - low FSG this morning given poor by mouth intake and abdominal pain. I reduced his aspart to 5 units twice daily  History of syncope, collapse and head injury  - Likely precipitated by hypotension/orthostatic hypotension  - 2-D echo January 2014 showed LVEF 55-60%.   HIV infection/hepatitis C  -patient had not continue follow up due to insurance issues. ID following and plan to restart him on ART. CD4 of 260. viral load of 1700   Chronic diarrhea  Resolved.  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 Severe abdominal pain for past 2 days associated nausea and one episode of vomiting yesterday.   Objective: Filed Vitals:   04/19/13 1319  BP: 98/68  Pulse: 84  Temp: 98.2 F (36.8 C)  Resp: 16    Intake/Output Summary (Last 24 hours) at 04/19/13 1525 Last data filed at 04/19/13 1319  Gross per 24 hour  Intake    240 ml  Output      0 ml  Net    240 ml   Filed Weights   04/17/13 1537 04/17/13 2044 04/18/13 2134  Weight: 51.3 kg (113 lb 1.5 oz) 51.299 kg (113 lb 1.5 oz) 52.3 kg (115 lb 4.8 oz)    Exam:  General: Middle aged thin built male in NAD  HEENT: no pallor, moist oral mucosa  chest: clear b/l, no added sounds  CVS: NS1&S2, no murmurs, rubs or gallop  Abd: soft, ND, tender to palpation over epigastric and midabdominal area. BS+  Ext: warm, no edema  CNS: AAOX3  Data Reviewed: Basic Metabolic Panel:  Recent Labs Lab 04/15/13 0750 04/17/13 1119  NA 134* 133*  K 3.4* 3.5  CL 95* 95*  CO2 21 23  GLUCOSE 123* 163*  BUN 41* 48*  CREATININE 10.06* 9.12*  CALCIUM 8.7 8.6  PHOS  --  3.9   Liver Function Tests:  Recent Labs Lab 04/15/13 0750 04/17/13 1119  AST 51*  --   ALT 63*  --  ALKPHOS 112  --   BILITOT 0.4  --   PROT 9.6*  --   ALBUMIN 3.6 3.2*    Recent Labs Lab 04/19/13 1345  LIPASE 78*   No results found for this basename: AMMONIA,  in the last 168 hours CBC:  Recent Labs Lab 04/15/13 0750 04/17/13 1119  WBC 7.7 6.5  NEUTROABS 4.7  --   HGB 12.9* 11.5*  HCT 36.6* 33.0*  MCV 92.0 91.2  PLT 223 193   Cardiac Enzymes:  Recent Labs Lab 04/15/13 0750  TROPONINI <0.30   BNP (last 3 results) No results found for this basename: PROBNP,  in the last 8760 hours CBG:  Recent Labs Lab 04/17/13 0759 04/17/13 1652 04/18/13 2136 04/18/13 2229 04/19/13 0736  GLUCAP 120* 83 66* 123* 78    Recent  Results (from the past 240 hour(s))  MRSA PCR SCREENING     Status: None   Collection Time    04/15/13  2:59 PM      Result Value Range Status   MRSA by PCR NEGATIVE  NEGATIVE Final   Comment:            The GeneXpert MRSA Assay (FDA     approved for NASAL specimens     only), is one component of a     comprehensive MRSA colonization     surveillance program. It is not     intended to diagnose MRSA     infection nor to guide or     monitor treatment for     MRSA infections.     Studies: No results found.  Scheduled Meds: . doxercalciferol  1 mcg Intravenous Q T,Th,Sa-HD  . feeding supplement (RESOURCE BREEZE)  1 Container Oral Q24H  . heparin  5,000 Units Subcutaneous Q8H  . insulin aspart protamine- aspart  10 Units Subcutaneous BID WC  . midodrine  10 mg Oral TID WC  . sodium chloride  3 mL Intravenous Q12H   Continuous Infusions:     Time spent: 25 minutes    Vincie Linn  [facility] Pager [phone]. If 7PM-7AM, please contact night-coverage at [email or web address], password [facility] 04/19/2013, 3:25 PM  LOS: 4 days

## 2013-04-19 NOTE — Progress Notes (Signed)
Patient ID: Alvin Daniels, male   DOB: [birth year], 42 y.o.   MRN: [id number]   [facility] Progress Note   Assessment/ Plan:   1. Headaches/blurred vision - resumed on Midodrine (unclear if this will be a sustainable medication for him to take without payer source for prescriptions). No acute intra-cranial pathology and I suspect that his headaches may be primarily due to his vision problems (right eye>left eye). Possible DC after clear disposition plan worked out for drug- help. 2.ESRD: Plan for next dialysis tomorrow- below EDW 3. Anemia: Hgb at goal, not on ESA 4. CKD-MBD: Ca/Phosphorus at goal- on VDRA 5. Nutrition:Chronically malnourished, takes ONS at dialysis 6. 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 Poor hygiene.  [id number] RRR, Normal S1 and S2 Resp:CTA bilaterally, no rales/rhonchi [id number], flat, non-tender, BS normal Ext:No LE edema. LFA AVF with good thrill and bruit  Labs: BMET  Recent Labs Lab 04/15/13 0750 04/17/13 1119  NA 134* 133*  K 3.4* 3.5  CL 95* 95*  CO2 21 23  GLUCOSE 123* 163*  BUN 41* 48*  CREATININE 10.06* 9.12*  CALCIUM 8.7 8.6  PHOS  --  3.9   CBC  Recent Labs Lab 04/15/13 0750 04/17/13 1119  WBC 7.7 6.5  NEUTROABS 4.7  --   HGB 12.9* 11.5*  HCT 36.6* 33.0*  MCV 92.0 91.2  PLT 223 193   Medications:    . doxercalciferol  1 mcg Intravenous Q T,Th,Sa-HD  . feeding supplement (RESOURCE BREEZE)  1 Container Oral Q24H  . heparin  5,000 Units Subcutaneous Q8H  . insulin aspart protamine- aspart  10 Units  Subcutaneous BID WC  . midodrine  10 mg Oral TID WC  . sodium chloride  3 mL Intravenous Q12H   Elmarie Shiley, MD 04/19/2013, 10:47 AM

## 2013-04-20 DIAGNOSIS — E1129 Type 2 diabetes mellitus with other diabetic kidney complication: Secondary | ICD-10-CM

## 2013-04-20 DIAGNOSIS — I9589 Other hypotension: Secondary | ICD-10-CM

## 2013-04-20 LAB — CBC
HCT: 34.8 % — ABNORMAL LOW (ref 39.0–52.0)
Hemoglobin: 12.2 g/dL — ABNORMAL LOW (ref 13.0–17.0)
MCHC: 35.1 g/dL (ref 30.0–36.0)
MCV: 90.2 fL (ref 78.0–100.0)
RBC: 3.86 MIL/uL — ABNORMAL LOW (ref 4.22–5.81)
WBC: 8.1 10*3/uL (ref 4.0–10.5)

## 2013-04-20 LAB — RENAL FUNCTION PANEL
BUN: 54 mg/dL — ABNORMAL HIGH (ref 6–23)
CO2: 23 mEq/L (ref 19–32)
Chloride: 92 mEq/L — ABNORMAL LOW (ref 96–112)
GFR calc Af Amer: 6 mL/min — ABNORMAL LOW (ref >90)
Glucose, Bld: 162 mg/dL — ABNORMAL HIGH (ref 70–99)
Phosphorus: 6 mg/dL — ABNORMAL HIGH (ref 2.3–4.6)
Potassium: 4.4 mEq/L (ref 3.5–5.1)
Sodium: 133 mEq/L — ABNORMAL LOW (ref 135–145)

## 2013-04-20 MED ORDER — RENA-VITE PO TABS
1.0000 | ORAL_TABLET | Freq: Every day | ORAL | Status: DC
  Administered 2013-04-20 – 2013-04-21 (×2): 1 via ORAL
  Filled 2013-04-20 (×3): qty 1

## 2013-04-20 MED ORDER — MIDODRINE HCL 5 MG PO TABS
ORAL_TABLET | ORAL | Status: AC
  Administered 2013-04-20: 10 mg via ORAL
  Filled 2013-04-20: qty 2

## 2013-04-20 MED ORDER — CHLORPROMAZINE HCL 25 MG PO TABS
25.0000 mg | ORAL_TABLET | Freq: Once | ORAL | Status: AC
  Administered 2013-04-20: 25 mg via ORAL
  Filled 2013-04-20: qty 1

## 2013-04-20 MED ORDER — HYDROCODONE-ACETAMINOPHEN 5-325 MG PO TABS
ORAL_TABLET | ORAL | Status: AC
  Administered 2013-04-20: 2 via ORAL
  Filled 2013-04-20: qty 2

## 2013-04-20 MED ORDER — HEPARIN SODIUM (PORCINE) 1000 UNIT/ML DIALYSIS
5000.0000 [IU] | INTRAMUSCULAR | Status: DC | PRN
  Administered 2013-04-20: 5000 [IU] via INTRAVENOUS_CENTRAL

## 2013-04-20 MED ORDER — ACETAMINOPHEN 325 MG PO TABS
ORAL_TABLET | ORAL | Status: AC
  Administered 2013-04-20: 650 mg via ORAL
  Filled 2013-04-20: qty 2

## 2013-04-20 MED ORDER — DOXERCALCIFEROL 4 MCG/2ML IV SOLN
INTRAVENOUS | Status: AC
  Administered 2013-04-20: 1 ug via INTRAVENOUS
  Filled 2013-04-20: qty 2

## 2013-04-20 MED ORDER — HYDROMORPHONE HCL PF 1 MG/ML IJ SOLN
INTRAMUSCULAR | Status: AC
  Administered 2013-04-20: 1 mg via INTRAVENOUS
  Filled 2013-04-20: qty 1

## 2013-04-20 NOTE — Procedures (Signed)
Patient seen on Hemodialysis. QB 400, UF goal 0.5L (keeping even). BP 70/35 Treatment adjusted as needed.  Alvin Shiley MD [facility]. Office # [phone] Pager # [phone] 9:54 AM

## 2013-04-20 NOTE — Progress Notes (Signed)
[facility] Progress Note  Subjective:   C/o hiccups  Objective Filed Vitals:   04/19/13 2013 04/20/13 0524 04/20/13 0558 04/20/13 0843  BP: 90/60 80/53 96/64  90/59  Pulse: 91 90  93  Temp: 98.3 F (36.8 C) 99.2 F (37.3 C)  97.6 F (36.4 C)  TempSrc: Oral Oral  Oral  Resp: 18 18  18   Height:      Weight:      SpO2: 97% 95%  98%   Physical Exam General: NAD on HD (keeping even); looks better than yesterday Heart: RRR Lungs: no wheezes or rales Abdomen: soft Extremities: no edema  Dialysis Access: left lower AVF  Dialysis Orders: TTS @ [facility]  4 hrs 53.5kg 400/A1.5 2K/2.25Ca Heparin 5300 U AVF @ LFA  Hectorol 1 mcg Epogen 0 Venofer 0   Assessment/Plan: 1. Headaches/blurred vision - resumed  Midodrine (unclear if this will be a sustainable medication for him to take without payer source for prescriptions). No acute intra-cranial pathology and I suspect that his headaches may be primarily due to his vision problems (right eye>left eye). Possible DC after clear disposition plan worked out for drug assistance. 2. ESRD - TTS K low; HD today per routine 3. Anemia - Hgb 11.5 - no ESA yet 4. Secondary hyperparathyroidism - on hectorol - no binder P ok 5. HTN/volume - chronic hypotension keeping even; pre D weight 51.2 - below EDW - this is the case at his HD center at times and pt is just kept even 6. Nutrition - will change to regular diet due to chronic diarrhea, volulme and BP not an issue//Breeze  - add vits 7. HIV - not on HAART - poorly compliant with appts  Myriam Jacobson, PA-C [facility] Beeper [phone] 04/20/2013,9:12 AM  LOS: 5 days    Additional Objective Labs: Basic Metabolic Panel:  Recent Labs Lab 04/15/13 0750 04/17/13 1119  NA 134* 133*  K 3.4* 3.5  CL 95* 95*  CO2 21 23  GLUCOSE 123* 163*  BUN 41* 48*  CREATININE 10.06* 9.12*  CALCIUM 8.7 8.6  PHOS  --  3.9   Liver Function Tests:  Recent Labs Lab 04/15/13 0750  04/17/13 1119 04/19/13 1828  AST 51*  --  50*  ALT 63*  --  54*  ALKPHOS 112  --  90  BILITOT 0.4  --  0.5  PROT 9.6*  --  8.4*  ALBUMIN 3.6 3.2* 3.3*    Recent Labs Lab 04/19/13 1345  LIPASE 78*   CBC:  Recent Labs Lab 04/15/13 0750 04/17/13 1119  WBC 7.7 6.5  NEUTROABS 4.7  --   HGB 12.9* 11.5*  HCT 36.6* 33.0*  MCV 92.0 91.2  PLT 223 193  Cardiac Enzymes:  Recent Labs Lab 04/15/13 0750  TROPONINI <0.30   CBG:  Recent Labs Lab 04/18/13 2136 04/18/13 2229 04/19/13 0736 04/19/13 1706 04/19/13 2158  GLUCAP 66* 123* 78 135* 129*  Studies/Results: Ct Abdomen Pelvis W Contrast  04/19/2013   CLINICAL DATA:  Abdominal pain.  Tenderness.  EXAM: CT ABDOMEN AND PELVIS WITH CONTRAST  TECHNIQUE: Multidetector CT imaging of the abdomen and pelvis was performed using the standard protocol following bolus administration of intravenous contrast.  CONTRAST:  71mL OMNIPAQUE IOHEXOL 300 MG/ML  SOLN  COMPARISON:  CT 09/28/2009.  Renal ultrasound 03/14/2010.  FINDINGS: Lung Bases: Lingular scarring or atelectasis.  Liver: Mild respiratory motion is present on the abdominal portion of the examination. Study is mildly degraded because of respiratory  motion. Minimal intrahepatic biliary ductal dilation is present. This can be associated with sphincter of Oddi dysfunction. No obstructing lesion is identified. Negative for hepatic cirrhosis.  Spleen:  Normal.  Gallbladder:  Distended.  Common bile duct: No common duct stone is identified. The common bile duct is enlarged, measuring up to 9 mm.  Pancreas: Mild dilation of the pancreatic duct measuring up to 3 mm. No pancreatic mass lesions.  Adrenal glands:  Normal.  Kidneys: Renal atrophy compared to the prior exam. 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 No ureteral calculi are identified. Calcification present adjacent to the mid left ureter probably represents phleboliths in the adjacent gonadal vein.  Stomach:  Patulous gastroesophageal junction. No inflammatory changes of the stomach.  Small bowel:  Within normal limits.  No mesenteric adenopathy.  Colon:   Normal appendix.  Redundant sigmoid.  Pelvic Genitourinary: Urinary bladder decompressed with mild mural thickening, likely secondary to collapse.  Bones:  No aggressive osseous lesions.  Vasculature: Normal aside from mild atherosclerosis.  Body Wall: Normal.  IMPRESSION: 1. Mild intrahepatic biliary ductal dilation with extra hepatic biliary ductal dilation and mild pancreatic ductal dilation. In this patient with HIV, this could be associated with HIV cholangiopathy or biliary sphincter dysfunction. If bilirubin is normal, no further evaluation is likely warranted. 2. Development of renal atrophy and medullary nephrocalcinosis. This has a large differential diagnosis ranging from renal tubular acidosis to medullary sponge kidney.   Electronically Signed   By: Dereck Ligas M.D.   On: 04/19/2013 16:03   Medications:   . doxercalciferol  1 mcg Intravenous Q T,Th,Sa-HD  . feeding supplement (RESOURCE BREEZE)  1 Container Oral Q24H  . heparin  5,000 Units Subcutaneous Q8H  . insulin aspart protamine- aspart  5 Units Subcutaneous BID WC  . midodrine  10 mg Oral TID WC  . sodium chloride  3 mL Intravenous Q12H

## 2013-04-20 NOTE — Progress Notes (Addendum)
I have personally seen and examined this patient and agree with the assessment/plan as outlined above by [facility] PA. Will give chlorpromazine 25mg  X1dose Ermalee Mealy K.,MD 04/20/2013 9:54 AM

## 2013-04-20 NOTE — Progress Notes (Signed)
INFECTIOUS DISEASE PROGRESS NOTE  ID: Alvin Daniels is a 42 y.o. male with  Principal Problem:   Orthostatic hypotension Active Problems:   HIV INFECTION   DIABETES MELLITUS, TYPE II, UNCONTROLLED, W/RENAL COMPS   RENAL FAILURE, END STAGE   Hepatitis C carrier   Polyneuropathy in diabetes(357.2)   Syncope and collapse   Chronic diarrhea   Anemia of chronic renal failure   Orthostatic headache   Abdominal  pain, other specified site  Subjective: Without complaints.  Seen in HD, he continues to have low SBP  Abtx:  Anti-infectives   None      Medications:  Scheduled: . doxercalciferol  1 mcg Intravenous Q T,Th,Sa-HD  . feeding supplement (RESOURCE BREEZE)  1 Container Oral Q24H  . heparin  5,000 Units Subcutaneous Q8H  . insulin aspart protamine- aspart  5 Units Subcutaneous BID WC  . midodrine  10 mg Oral TID WC  . multivitamin  1 tablet Oral QHS  . sodium chloride  3 mL Intravenous Q12H    Objective: Vital signs in last 24 hours: Temp:  [97.6 F (36.4 C)-99.2 F (37.3 C)] 98.3 F (36.8 C) (12/09 0900) Pulse Rate:  [79-93] 79 (12/09 1100) Resp:  [16-18] 18 (12/09 0900) BP: (65-113)/(35-71) 80/45 mmHg (12/09 1100) SpO2:  [95 %-98 %] 98 % (12/09 0900) Weight:  [51.2 kg (112 lb 14 oz)] 51.2 kg (112 lb 14 oz) (12/09 0900)   General appearance: alert, cooperative and no distress Resp: clear to auscultation bilaterally Cardio: regular rate and rhythm GI: normal findings: bowel sounds normal and soft, non-tender  Lab Results  Recent Labs  04/20/13 0942  WBC 8.1  HGB 12.2*  HCT 34.8*  NA 133*  K 4.4  CL 92*  CO2 23  BUN 54*  CREATININE 11.02*   Liver Panel  Recent Labs  04/19/13 1828 04/20/13 0942  PROT 8.4*  --   ALBUMIN 3.3* 3.3*  AST 50*  --   ALT 54*  --   ALKPHOS 90  --   BILITOT 0.5  --   BILIDIR 0.2  --   IBILI 0.3  --    Sedimentation Rate No results found for this basename: ESRSEDRATE,  in the last 72  hours C-Reactive Protein No results found for this basename: CRP,  in the last 72 hours  Microbiology: Recent Results (from the past 240 hour(s))  MRSA PCR SCREENING     Status: None   Collection Time    04/15/13  2:59 PM      Result Value Range Status   MRSA by PCR NEGATIVE  NEGATIVE Final   Comment:            The GeneXpert MRSA Assay (FDA     approved for NASAL specimens     only), is one component of a     comprehensive MRSA colonization     surveillance program. It is not     intended to diagnose MRSA     infection nor to guide or     monitor treatment for     MRSA infections.    Studies/Results: Ct Abdomen Pelvis W Contrast  04/19/2013   CLINICAL DATA:  Abdominal pain.  Tenderness.  EXAM: CT ABDOMEN AND PELVIS WITH CONTRAST  TECHNIQUE: Multidetector CT imaging of the abdomen and pelvis was performed using the standard protocol following bolus administration of intravenous contrast.  CONTRAST:  50mL OMNIPAQUE IOHEXOL 300 MG/ML  SOLN  COMPARISON:  CT 09/28/2009.  Renal ultrasound 03/14/2010.  FINDINGS: Lung Bases: Lingular scarring or atelectasis.  Liver: Mild respiratory motion is present on the abdominal portion of the examination. Study is mildly degraded because of respiratory motion. Minimal intrahepatic biliary ductal dilation is present. This can be associated with sphincter of Oddi dysfunction. No obstructing lesion is identified. Negative for hepatic cirrhosis.  Spleen:  Normal.  Gallbladder:  Distended.  Common bile duct: No common duct stone is identified. The common bile duct is enlarged, measuring up to 9 mm.  Pancreas: Mild dilation of the pancreatic duct measuring up to 3 mm. No pancreatic mass lesions.  Adrenal glands:  Normal.  Kidneys: Renal atrophy compared to the prior exam. 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 No ureteral calculi are identified. Calcification present adjacent to the mid left ureter probably represents phleboliths in  the adjacent gonadal vein.  Stomach: Patulous gastroesophageal junction. No inflammatory changes of the stomach.  Small bowel:  Within normal limits.  No mesenteric adenopathy.  Colon:   Normal appendix.  Redundant sigmoid.  Pelvic Genitourinary: Urinary bladder decompressed with mild mural thickening, likely secondary to collapse.  Bones:  No aggressive osseous lesions.  Vasculature: Normal aside from mild atherosclerosis.  Body Wall: Normal.  IMPRESSION: 1. Mild intrahepatic biliary ductal dilation with extra hepatic biliary ductal dilation and mild pancreatic ductal dilation. In this patient with HIV, this could be associated with HIV cholangiopathy or biliary sphincter dysfunction. If bilirubin is normal, no further evaluation is likely warranted. 2. Development of renal atrophy and medullary nephrocalcinosis. This has a large differential diagnosis ranging from renal tubular acidosis to medullary sponge kidney.   Electronically Signed   By: Dereck Ligas M.D.   On: 04/19/2013 16:03     Assessment/Plan: HIV+  Chronic Hypotension  ESRD DM2  Chronic diarrhea (stool studies -)  Total days of antibiotics: 0  My great appreciation to Renal and [facility] Possible d/c today.  Will make sure he has f/u appt in ID clinic, try to get him back on HIV tx there.           [name] Infectious Diseases (pager) [phone] [email or web address] 04/20/2013, 11:35 AM  LOS: 5 days

## 2013-04-20 NOTE — Progress Notes (Signed)
[facility] PROGRESS NOTE  Alvin Daniels SU:[id number] DOB: [birth year] DOA: 04/15/2013 PCP: Lucrezia Starch, MD  Brief narrative  42 y/o hispanic pame with HIV AIDS ( not on medication due to lack of follow up by patient, hepc , DM with neuropathy, chr diarrhea, hypotension, rt bell's palsy, ESRD on HD (Tu, th sat) presented to ED with headache worsened on standing progressive for several weeks.   Assessment/Plan:  Orthostatic hypotension with headache  appears to be chronic and worsened now. Likely autonomic neuropathy with DM, AIDS and ESRD . Was placed on midodrine before but could not get it due to insurance issue.  IV fluids as needed. -CT head/ MRI brain without any abnormality  restarted midodrine 10 mg tid. His headache has improved now.  -BP stabel last 2 days. Dropped this morning to 0000000 systolic pre dialysis but was asymptomatic. Will monitor for today.  -CM working on getting subsidy for his midodrine prescription. If available , we can safely discharge him tomorrow. -  ESRD on TTS HD  -renal following. Getting hectoral   Abdominal pain  Patient reports diffuse abdominal pain for 2-3 days during hospital stay and has been nauseous. He also had few episodes of vomiting. Marland Kitchen abdominal exam was tender on 12/8.Marland Kitchen No further diarrhea.  CT scan of the abdomen and pelvis to rule out any colitis showed dilatation adn intra and extra hepatic biliary duct. This could be related to HIV colangiopathy. total bilirubin and lipase were normal. symptoms much improved. continue low dose dilaudid prn. .  DM 2 with neuropathy and renal complications  -123XX123 of 6.2  - low FSG this morning given poor by mouth intake and abdominal pain. I have reduced his aspart to 5 units twice daily.   History of syncope, collapse and head injury  - Likely precipitated by hypotension/orthostatic hypotension  - 2-D echo January 2014 showed LVEF 55-60%.   HIV infection/hepatitis C  -patient had not  continue follow up due to insurance issues. ID following and plan to restart him on ART. CD4 of 260. viral load of 1700   Chronic diarrhea  Resolved.   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 abdominal pain has improved today  as well.   Objective: Filed Vitals:   04/20/13 1320  BP: 91/50  Pulse: 87  Temp: 98 F (36.7 C)  Resp: 20    Intake/Output Summary (Last 24 hours) at 04/20/13 1417 Last data filed at 04/20/13 1320  Gross per 24 hour  Intake    120 ml  Output   -415 ml  Net    535 ml   Filed Weights   04/18/13 2134 04/20/13 0900 04/20/13 1352  Weight: 52.3 kg (115 lb 4.8 oz) 51.2 kg (112 lb 14 oz) 50.9 kg (112 lb 3.4 oz)    Exam:  General: Middle aged thin built male in NAD  HEENT: no pallor, moist oral mucosa  chest: clear b/l, no added sounds  CVS: NS1&S2, no murmurs, rubs or gallop  Abd: soft, ND,non tender. BS+  Ext: warm, no edema  CNS: AAOX3  Data Reviewed: Basic Metabolic Panel:  Recent Labs Lab 04/15/13 0750 04/17/13 1119 04/20/13 0942  NA 134* 133* 133*  K 3.4* 3.5 4.4  CL 95* 95* 92*  CO2 21 23 23   GLUCOSE 123* 163* 162*  BUN 41* 48* 54*  CREATININE 10.06* 9.12* 11.02*  CALCIUM 8.7 8.6 8.8  PHOS  --  3.9 6.0*   Liver Function Tests:  Recent Labs Lab 04/15/13 0750 04/17/13 1119 04/19/13 1828 04/20/13 0942  AST 51*  --  50*  --   ALT 63*  --  54*  --   ALKPHOS 112  --  90  --   BILITOT 0.4  --  0.5  --   PROT 9.6*  --  8.4*  --   ALBUMIN 3.6 3.2* 3.3* 3.3*    Recent Labs Lab  04/19/13 1345  LIPASE 78*   No results found for this basename: AMMONIA,  in the last 168 hours CBC:  Recent Labs Lab 04/15/13 0750 04/17/13 1119 04/20/13 0942  WBC 7.7 6.5 8.1  NEUTROABS 4.7  --   --   HGB 12.9* 11.5* 12.2*  HCT 36.6* 33.0* 34.8*  MCV 92.0 91.2 90.2  PLT 223 193 230   Cardiac Enzymes:  Recent Labs Lab 04/15/13 0750  TROPONINI <0.30   BNP (last 3 results) No results found for this basename: PROBNP,  in the last 8760 hours CBG:  Recent Labs Lab 04/18/13 2136 04/18/13 2229 04/19/13 0736 04/19/13 1706 04/19/13 2158  GLUCAP 66* 123* 78 135* 129*    Recent Results (from the past 240 hour(s))  MRSA PCR SCREENING     Status: None   Collection Time    04/15/13  2:59 PM      Result Value Range Status   MRSA by PCR NEGATIVE  NEGATIVE Final   Comment:            The GeneXpert MRSA Assay (FDA     approved for NASAL specimens     only), is one component of a     comprehensive MRSA colonization     surveillance program. It is not     intended to diagnose MRSA     infection nor to guide or     monitor treatment for     MRSA infections.     Studies: Ct Abdomen Pelvis W Contrast  04/19/2013   CLINICAL DATA:  Abdominal pain.  Tenderness.  EXAM: CT ABDOMEN AND PELVIS WITH CONTRAST  TECHNIQUE: Multidetector CT imaging of the abdomen and pelvis was performed using the standard protocol following bolus administration of intravenous contrast.  CONTRAST:  38mL OMNIPAQUE IOHEXOL 300 MG/ML  SOLN  COMPARISON:  CT 09/28/2009.  Renal ultrasound 03/14/2010.  FINDINGS: Lung Bases: Lingular scarring or atelectasis.  Liver: Mild respiratory motion is present on the abdominal portion of the examination. Study is mildly degraded because of respiratory motion. Minimal intrahepatic biliary ductal dilation is present. This can be associated with sphincter of Oddi dysfunction. No obstructing lesion is identified. Negative for hepatic cirrhosis.  Spleen:  Normal.  Gallbladder:   Distended.  Common bile duct: No common duct stone is identified. The common bile duct is enlarged, measuring up to 9 mm.  Pancreas: Mild dilation of the pancreatic duct measuring up to 3 mm. No pancreatic mass lesions.  Adrenal glands:  Normal.  Kidneys: Renal atrophy compared to the prior exam. 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 No ureteral calculi are identified. Calcification present adjacent to the mid left ureter  probably represents phleboliths in the adjacent gonadal vein.  Stomach: Patulous gastroesophageal junction. No inflammatory changes of the stomach.  Small bowel:  Within normal limits.  No mesenteric adenopathy.  Colon:   Normal appendix.  Redundant sigmoid.  Pelvic Genitourinary: Urinary bladder decompressed with mild mural thickening, likely secondary to collapse.  Bones:  No aggressive osseous lesions.  Vasculature: Normal aside from mild atherosclerosis.  Body Wall: Normal.  IMPRESSION: 1. Mild intrahepatic biliary ductal dilation with extra hepatic biliary ductal dilation and mild pancreatic ductal dilation. In this patient with HIV, this could be associated with HIV cholangiopathy or biliary sphincter dysfunction. If bilirubin is normal, no further evaluation is likely warranted. 2. Development of renal atrophy and medullary nephrocalcinosis. This has a large differential diagnosis ranging from renal tubular acidosis to medullary sponge kidney.   Electronically Signed   By: Dereck Ligas M.D.   On: 04/19/2013 16:03    Scheduled Meds: . doxercalciferol  1 mcg Intravenous Q T,Th,Sa-HD  . feeding supplement (RESOURCE BREEZE)  1 Container Oral Q24H  . heparin  5,000 Units Subcutaneous Q8H  . insulin aspart protamine- aspart  5 Units Subcutaneous BID WC  . midodrine  10 mg Oral TID WC  . multivitamin  1 tablet Oral QHS  . sodium chloride  3 mL Intravenous Q12H   Continuous Infusions:     Time spent: 25 minutes    Kynlee Koenigsberg  [facility] Pager [phone]. If 7PM-7AM, please contact night-coverage at [email or web address], password [facility] 04/20/2013, 2:17 PM  LOS: 5 days

## 2013-04-20 NOTE — Progress Notes (Signed)
NUTRITION FOLLOW UP  Intervention:    Company secretary daily (250 kcals, 9 gm protein per 8 fl oz carton) RD to follow for nutrition care plan  Nutrition Dx:   Increased nutrient needs related to HD and HIV as evidenced by estimated needs, ongoing  Goal:   Pt to meet >/= 90% of their estimated nutrition needs, progressing  Monitor:   PO & supplemental intake, weight, labs, I/O's  Assessment:   PMHx significant for ESRD on TTS HD, hepatitis C and HIV, DM with neuropathy, chronic diarrhea, hypotension. Patient has had problems with chronic hypotension and presents to the dialysis center with blood pressures of 60's, receives 250-300 mL of fluids across dialysis and completes dialysis. Admitted with orthostatic hypotension, frontal HA's and associated blurred vision for several weeks.   Patient's diet liberalized to Regular this AM per PA-C. + diarrhea.  PO intake poor at 25% per flowsheet records.  Resource Breeze supplement in place.  Per RN, patient has been feeling nauseous, however, he's drinking a little of [employer].  Also receiving RENA-VIT daily.  Height: Ht Readings from Last 1 Encounters:  04/18/13 5' 2.99" (1.6 m)    Weight Status:   Wt Readings from Last 1 Encounters:  04/20/13 112 lb 14 oz (51.2 kg)    Re-estimated needs:  Kcal: 1500-1700 Protein: 75-90 gm Fluid: 1200 ml  Skin: Intact  Diet Order: General   Intake/Output Summary (Last 24 hours) at 04/20/13 1047 Last data filed at 04/20/13 0758  Gross per 24 hour  Intake    120 ml  Output      0 ml  Net    120 ml    Labs:   Recent Labs Lab 04/15/13 0750 04/17/13 1119 04/20/13 0942  NA 134* 133* 133*  K 3.4* 3.5 4.4  CL 95* 95* 92*  CO2 21 23 23   BUN 41* 48* 54*  CREATININE 10.06* 9.12* 11.02*  CALCIUM 8.7 8.6 8.8  PHOS  --  3.9 6.0*  GLUCOSE 123* 163* 162*    CBG (last 3)   Recent Labs  04/19/13 0736 04/19/13 1706 04/19/13 2158  GLUCAP 78 135* 129*    Scheduled  Meds: . chlorproMAZINE  25 mg Oral Once  . doxercalciferol  1 mcg Intravenous Q T,Th,Sa-HD  . feeding supplement (RESOURCE BREEZE)  1 Container Oral Q24H  . heparin  5,000 Units Subcutaneous Q8H  . insulin aspart protamine- aspart  5 Units Subcutaneous BID WC  . midodrine  10 mg Oral TID WC  . multivitamin  1 tablet Oral QHS  . sodium chloride  3 mL Intravenous Q12H    Continuous Infusions:   Arthur Holms, RD, LDN Pager #: [phone] After-Hours Pager #: [phone]

## 2013-04-21 DIAGNOSIS — I959 Hypotension, unspecified: Secondary | ICD-10-CM

## 2013-04-21 LAB — GLUCOSE, CAPILLARY: Glucose-Capillary: 191 mg/dL — ABNORMAL HIGH (ref 70–99)

## 2013-04-21 MED ORDER — SODIUM CHLORIDE 0.9 % IV BOLUS (SEPSIS)
250.0000 mL | Freq: Once | INTRAVENOUS | Status: AC
  Administered 2013-04-21: 250 mL via INTRAVENOUS

## 2013-04-21 NOTE — Progress Notes (Signed)
[facility] PROGRESS NOTE  Alvin Daniels SU:[id number] DOB: 03-14-1971 DOA: 04/15/2013 PCP: Lucrezia Starch, MD  Brief narrative  42 y/o hispanic pame with HIV AIDS ( not on medication due to lack of follow up by patient, hepc , DM with neuropathy, chr diarrhea, hypotension, rt bell's palsy, ESRD on HD (Tu, th sat) presented to ED with headache worsened on standing progressive for several weeks.   Assessment/Plan:  Orthostatic hypotension with headache  appears to be chronic and worsened now. Likely autonomic neuropathy with DM, AIDS and ESRD . Was placed on midodrine before but could not get it due to insurance issue.  IV fluids as needed. -CT head/ MRI brain without any abnormality  restarted midodrine 10 mg tid. His headache has improved now.  -BPlow this am, improved with 250 mlbolus.  -CM working on getting subsidy for his midodrine prescription. If available , we can safely discharge him tomorrow. -  ESRD on TTS HD  -renal following. Getting hectoral   Abdominal pain  Patient reports diffuse abdominal pain for 2-3 days during hospital stay and has been nauseous. He also had few episodes of vomiting. Marland Kitchen abdominal exam was tender on 12/8.Marland Kitchen No further diarrhea.  CT scan of the abdomen and pelvis to rule out any colitis showed dilatation adn intra and extra hepatic biliary duct. This could be related to HIV colangiopathy. total bilirubin and lipase were normal. symptoms much improved. continue low dose dilaudid prn. .  DM 2 with neuropathy and renal complications  -123XX123 of 6.2  - CBG (last 3)   Recent Labs  04/19/13 1706 04/19/13 2158 04/20/13 2323  GLUCAP 135* 129* 191*        History of syncope, collapse and head injury  - Likely precipitated by hypotension/orthostatic hypotension  - 2-D echo January 2014 showed LVEF 55-60%.   HIV infection/hepatitis C  -patient had not continue follow up due to insurance issues. ID following and plan to restart him on ART.  CD4 of 260. viral load of 1700   Chronic diarrhea  Resolved.   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 Improved after 2 hours. abdominal pain has improved today  as well.   Objective: Filed Vitals:   04/21/13 1155  BP: 119/79  Pulse: 86  Temp: 98.5 F (36.9 C)  Resp: 18    Intake/Output Summary (Last 24 hours) at 04/21/13 1429 Last data filed at 04/21/13 1325  Gross per 24 hour  Intake    600 ml  Output      0 ml  Net    600 ml   Filed Weights   04/18/13 2134 04/20/13 0900 04/20/13 1352  Weight: 52.3 kg (115 lb 4.8 oz) 51.2 kg (112 lb 14 oz) 50.9 kg (112 lb 3.4 oz)    Exam:  General: Middle aged thin built male in NAD  HEENT: no pallor, moist oral mucosa  chest: clear b/l, no added sounds  CVS: NS1&S2, no murmurs, rubs or gallop  Abd: soft, ND,non tender. BS+  Ext: warm, no edema  CNS:  AAOX3  Data Reviewed: Basic Metabolic Panel:  Recent Labs Lab 04/15/13 0750 04/17/13 1119 04/20/13 0942  NA 134* 133* 133*  K 3.4* 3.5 4.4  CL 95* 95* 92*  CO2 21 23 23   GLUCOSE 123* 163* 162*  BUN 41* 48* 54*  CREATININE 10.06* 9.12* 11.02*  CALCIUM 8.7 8.6 8.8  PHOS  --  3.9 6.0*   Liver Function Tests:  Recent Labs Lab 04/15/13 0750 04/17/13 1119 04/19/13 1828 04/20/13 0942  AST 51*  --  50*  --   ALT 63*  --  54*  --   ALKPHOS 112  --  90  --   BILITOT 0.4  --  0.5  --   PROT 9.6*  --  8.4*  --   ALBUMIN 3.6 3.2* 3.3* 3.3*    Recent Labs Lab 04/19/13 1345  LIPASE 78*   No results found for  this basename: AMMONIA,  in the last 168 hours CBC:  Recent Labs Lab 04/15/13 0750 04/17/13 1119 04/20/13 0942  WBC 7.7 6.5 8.1  NEUTROABS 4.7  --   --   HGB 12.9* 11.5* 12.2*  HCT 36.6* 33.0* 34.8*  MCV 92.0 91.2 90.2  PLT 223 193 230   Cardiac Enzymes:  Recent Labs Lab 04/15/13 0750  TROPONINI <0.30   BNP (last 3 results) No results found for this basename: PROBNP,  in the last 8760 hours CBG:  Recent Labs Lab 04/18/13 2229 04/19/13 0736 04/19/13 1706 04/19/13 2158 04/20/13 2323  GLUCAP 123* 78 135* 129* 191*    Recent Results (from the past 240 hour(s))  MRSA PCR SCREENING     Status: None   Collection Time    04/15/13  2:59 PM      Result Value Range Status   MRSA by PCR NEGATIVE  NEGATIVE Final   Comment:            The GeneXpert MRSA Assay (FDA     approved for NASAL specimens     only), is one component of a     comprehensive MRSA colonization     surveillance program. It is not     intended to diagnose MRSA     infection nor to guide or     monitor treatment for     MRSA infections.     Studies: Ct Abdomen Pelvis W Contrast  04/19/2013   CLINICAL DATA:  Abdominal pain.  Tenderness.  EXAM: CT ABDOMEN AND PELVIS WITH CONTRAST  TECHNIQUE: Multidetector CT imaging of the abdomen and pelvis was performed using the standard protocol following bolus administration of intravenous contrast.  CONTRAST:  39mL OMNIPAQUE IOHEXOL 300 MG/ML  SOLN  COMPARISON:  CT 09/28/2009.  Renal ultrasound 03/14/2010.  FINDINGS: Lung Bases: Lingular scarring or atelectasis.  Liver: Mild respiratory motion is present on the abdominal portion of the examination. Study is mildly degraded because of respiratory motion. Minimal intrahepatic biliary ductal dilation is present. This can be associated with sphincter of Oddi dysfunction. No obstructing lesion is identified. Negative for hepatic cirrhosis.  Spleen:  Normal.  Gallbladder:  Distended.  Common bile duct: No common duct stone is  identified. The common bile duct is enlarged, measuring up to 9 mm.  Pancreas: Mild dilation of the pancreatic duct measuring up to 3 mm. No pancreatic mass lesions.  Adrenal glands:  Normal.  Kidneys: Renal atrophy compared to the prior exam. 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 No ureteral calculi are identified. Calcification present adjacent to the mid  left ureter probably represents phleboliths in the adjacent gonadal vein.  Stomach: Patulous gastroesophageal junction. No inflammatory changes of the stomach.  Small bowel:  Within normal limits.  No mesenteric adenopathy.  Colon:   Normal appendix.  Redundant sigmoid.  Pelvic Genitourinary: Urinary bladder decompressed with mild mural thickening, likely secondary to collapse.  Bones:  No aggressive osseous lesions.  Vasculature: Normal aside from mild atherosclerosis.  Body Wall: Normal.  IMPRESSION: 1. Mild intrahepatic biliary ductal dilation with extra hepatic biliary ductal dilation and mild pancreatic ductal dilation. In this patient with HIV, this could be associated with HIV cholangiopathy or biliary sphincter dysfunction. If bilirubin is normal, no further evaluation is likely warranted. 2. Development of renal atrophy and medullary nephrocalcinosis. This has a large differential diagnosis ranging from renal tubular acidosis to medullary sponge kidney.   Electronically Signed   By: Dereck Ligas M.D.   On: 04/19/2013 16:03    Scheduled Meds: . doxercalciferol  1 mcg Intravenous Q T,Th,Sa-HD  . feeding supplement (RESOURCE BREEZE)  1 Container Oral Q24H  . heparin  5,000 Units Subcutaneous Q8H  . insulin aspart protamine- aspart  5 Units Subcutaneous BID WC  . midodrine  10 mg Oral TID WC  . multivitamin  1 tablet Oral QHS  . sodium chloride  3 mL Intravenous Q12H   Continuous Infusions:     Time spent: 25 minutes    Alvin Daniels  [facility] Pager [phone]. If 7PM-7AM, please contact  night-coverage at [email or web address], password [facility] 04/21/2013, 2:29 PM  LOS: 6 days

## 2013-04-21 NOTE — Progress Notes (Signed)
I have personally seen and examined this patient and agree with the assessment/plan as outlined above by [facility]. Alvin Daniels' acute problems appear to have been solved and most of his complaints appear to be chronic in nature. Last week, he informed me that he had started talking to a Education officer, museum at [facility] for help to be seen at their ophthalmology clinic (which apparently would be the best option for him given limited finances). Anticipate discharge soon-will need drug assistance for midodrine. Alvin Prickett K.,MD 04/21/2013 11:07 AM

## 2013-04-21 NOTE — Progress Notes (Signed)
[facility] Progress Note  Subjective:   C/o H/A  And problems sleeping. Not eating much  Objective Filed Vitals:   04/20/13 1710 04/20/13 2105 04/21/13 0525 04/21/13 0838  BP: 71/39 94/81 85/47  78/44  Pulse: 90 86 84 96  Temp: 99.5 F (37.5 C) 98.2 F (36.8 C) 98.9 F (37.2 C) 98.5 F (36.9 C)  TempSrc: Oral Oral Oral Oral  Resp: 19 20 18 18   Height:      Weight:      SpO2: 92% 100% 98% 98%   Physical Exam General: sitting up in chair Heart: RRR Lungs: no wheezes or rales Abdomen: soft + Bs Extremities: no edema Dialysis Access: left lower AVF patent  Dialysis Orders: TTS @ [facility]  4 hrs 53.5kg 400/A1.5 2K/2.25Ca Heparin 5300 U AVF @ LFA  Hectorol 1 mcg Epogen 0 Venofer 0   Assessment/Plan:  1. Headaches/blurred vision - resumed Midodrine (unclear if this will be a sustainable medication for him to take without payer source for prescriptions). No acute intra-cranial pathology and Dr Posey Pronto suspects that his headaches may be primarily due to his vision problems (right eye>left eye). Possible DC after clear disposition plan worked out for drug assistance.  2. ESRD - TTS K 4.4 pre HD yesterday - HD tomorrow if not d/c 3. Anemia - Hgb 12.2 - no ESA yet  4. Secondary hyperparathyroidism - on hectorol - no binder P up to 6 - to be followed at dialysis - various -  5. HTN/volume - chronic hypotension keeping even; pre D weight 51.2 - below EDW - though he is often below EDW at outpt center - + 415 during tmt Tuesday 6. Nutrition - will change to regular diet due to chronic diarrhea, volulme and BP not an issue//Breeze - add vits - intake poor 7. HIV - not on HAART - seen by ID  For f /u in clinic - hopefully treatment could improve chronic GI issues 8. Hep C + ^ LFTs - nml bili  Myriam Jacobson, PA-C [facility] Beeper [phone] 04/21/2013,9:56 AM  LOS: 6 days    Additional Objective Labs: Basic Metabolic Panel:  Recent Labs Lab  04/15/13 0750 04/17/13 1119 04/20/13 0942  NA 134* 133* 133*  K 3.4* 3.5 4.4  CL 95* 95* 92*  CO2 21 23 23   GLUCOSE 123* 163* 162*  BUN 41* 48* 54*  CREATININE 10.06* 9.12* 11.02*  CALCIUM 8.7 8.6 8.8  PHOS  --  3.9 6.0*   Liver Function Tests:  Recent Labs Lab 04/15/13 0750 04/17/13 1119 04/19/13 1828 04/20/13 0942  AST 51*  --  50*  --   ALT 63*  --  54*  --   ALKPHOS 112  --  90  --   BILITOT 0.4  --  0.5  --   PROT 9.6*  --  8.4*  --   ALBUMIN 3.6 3.2* 3.3* 3.3*    Recent Labs Lab 04/19/13 1345  LIPASE 78*   CBC:  Recent Labs Lab 04/15/13 0750 04/17/13 1119 04/20/13 0942  WBC 7.7 6.5 8.1  NEUTROABS 4.7  --   --   HGB 12.9* 11.5* 12.2*  HCT 36.6* 33.0* 34.8*  MCV 92.0 91.2 90.2  PLT 223 193 230   Cardiac Enzymes:  Recent Labs Lab 04/15/13 0750  TROPONINI <0.30   CBG:  Recent Labs Lab 04/18/13 2229 04/19/13 0736 04/19/13 1706 04/19/13 2158 04/20/13 2323  GLUCAP 123* 78 135* 129* 191*   IStudies/Results: Ct Abdomen Pelvis  W Contrast  04/19/2013   CLINICAL DATA:  Abdominal pain.  Tenderness.  EXAM: CT ABDOMEN AND PELVIS WITH CONTRAST  TECHNIQUE: Multidetector CT imaging of the abdomen and pelvis was performed using the standard protocol following bolus administration of intravenous contrast.  CONTRAST:  48mL OMNIPAQUE IOHEXOL 300 MG/ML  SOLN  COMPARISON:  CT 09/28/2009.  Renal ultrasound 03/14/2010.  FINDINGS: Lung Bases: Lingular scarring or atelectasis.  Liver: Mild respiratory motion is present on the abdominal portion of the examination. Study is mildly degraded because of respiratory motion. Minimal intrahepatic biliary ductal dilation is present. This can be associated with sphincter of Oddi dysfunction. No obstructing lesion is identified. Negative for hepatic cirrhosis.  Spleen:  Normal.  Gallbladder:  Distended.  Common bile duct: No common duct stone is identified. The common bile duct is enlarged, measuring up to 9 mm.  Pancreas: Mild  dilation of the pancreatic duct measuring up to 3 mm. No pancreatic mass lesions.  Adrenal glands:  Normal.  Kidneys: Renal atrophy compared to the prior exam. 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 No ureteral calculi are identified. Calcification present adjacent to the mid left ureter probably represents phleboliths in the adjacent gonadal vein.  Stomach: Patulous gastroesophageal junction. No inflammatory changes of the stomach.  Small bowel:  Within normal limits.  No mesenteric adenopathy.  Colon:   Normal appendix.  Redundant sigmoid.  Pelvic Genitourinary: Urinary bladder decompressed with mild mural thickening, likely secondary to collapse.  Bones:  No aggressive osseous lesions.  Vasculature: Normal aside from mild atherosclerosis.  Body Wall: Normal.  IMPRESSION: 1. Mild intrahepatic biliary ductal dilation with extra hepatic biliary ductal dilation and mild pancreatic ductal dilation. In this patient with HIV, this could be associated with HIV cholangiopathy or biliary sphincter dysfunction. If bilirubin is normal, no further evaluation is likely warranted. 2. Development of renal atrophy and medullary nephrocalcinosis. This has a large differential diagnosis ranging from renal tubular acidosis to medullary sponge kidney.   Electronically Signed   By: Dereck Ligas M.D.   On: 04/19/2013 16:03   Medications:   . doxercalciferol  1 mcg Intravenous Q T,Th,Sa-HD  . feeding supplement (RESOURCE BREEZE)  1 Container Oral Q24H  . heparin  5,000 Units Subcutaneous Q8H  . insulin aspart protamine- aspart  5 Units Subcutaneous BID WC  . midodrine  10 mg Oral TID WC  . multivitamin  1 tablet Oral QHS  . sodium chloride  250 mL Intravenous Once  . sodium chloride  3 mL Intravenous Q12H

## 2013-04-21 NOTE — Progress Notes (Signed)
   CARE MANAGEMENT NOTE 04/21/2013  Patient:  Alvin Daniels, Alvin Daniels   Account Number:  [id number]  Date Initiated:  04/21/2013  Documentation initiated by:  Lizabeth Leyden  Subjective/Objective Assessment:   admitted with hypotension  ESRD/HD     Action/Plan:   progression of care and discharge planning   Anticipated DC Date:  04/22/2013   Anticipated DC Plan:  [place] Program      Choice offered to / List presented to:             Status of service:  In process, will continue to follow Medicare Important Message given?   (If response is "NO", the following Medicare IM given date fields will be blank) Date Medicare IM given:   Date Additional Medicare IM given:    Discharge Disposition:    Per UR Regulation:    If discussed at Long Length of Stay Meetings, dates discussed:    Comments:  04/21/2013 [place], [place] Medication assistance  unable to afford medications, midodrine.  NCM met with patient and Spanish interpreter regarding medications. Patient is not eligible for [facility] program, he had received [place] letter 06/2012.  MATCH override for medication assistance.  Letter to be given to patient at discharge.

## 2013-04-22 LAB — RENAL FUNCTION PANEL
Albumin: 3.1 g/dL — ABNORMAL LOW (ref 3.5–5.2)
BUN: 37 mg/dL — ABNORMAL HIGH (ref 6–23)
CO2: 24 mEq/L (ref 19–32)
Calcium: 8.6 mg/dL (ref 8.4–10.5)
Chloride: 99 mEq/L (ref 96–112)
Creatinine, Ser: 9.66 mg/dL — ABNORMAL HIGH (ref 0.50–1.35)
GFR calc Af Amer: 7 mL/min — ABNORMAL LOW (ref >90)
GFR calc non Af Amer: 6 mL/min — ABNORMAL LOW (ref >90)
Glucose, Bld: 121 mg/dL — ABNORMAL HIGH (ref 70–99)
Phosphorus: 4.7 mg/dL — ABNORMAL HIGH (ref 2.3–4.6)
Potassium: 4.2 mEq/L (ref 3.5–5.1)
Sodium: 137 mEq/L (ref 135–145)

## 2013-04-22 LAB — CBC
HCT: 34.4 % — ABNORMAL LOW (ref 39.0–52.0)
Hemoglobin: 12 g/dL — ABNORMAL LOW (ref 13.0–17.0)
MCH: 31.8 pg (ref 26.0–34.0)
MCHC: 34.9 g/dL (ref 30.0–36.0)
MCV: 91.2 fL (ref 78.0–100.0)
Platelets: 199 10*3/uL (ref 150–400)
RBC: 3.77 MIL/uL — ABNORMAL LOW (ref 4.22–5.81)
RDW: 12.9 % (ref 11.5–15.5)
WBC: 6.8 10*3/uL (ref 4.0–10.5)

## 2013-04-22 LAB — GLUCOSE, CAPILLARY: Glucose-Capillary: 90 mg/dL (ref 70–99)

## 2013-04-22 MED ORDER — PENTAFLUOROPROP-TETRAFLUOROETH EX AERO: 1.0000 "application " | INHALATION_SPRAY | CUTANEOUS | Status: DC | PRN

## 2013-04-22 MED ORDER — INSULIN ASPART PROT & ASPART (70-30 MIX) 100 UNIT/ML ~~LOC~~ SUSP: 5.0000 [IU] | Freq: Two times a day (BID) | SUBCUTANEOUS | Status: DC

## 2013-04-22 MED ORDER — ONDANSETRON HCL 4 MG/2ML IJ SOLN
INTRAMUSCULAR | Status: AC
  Filled 2013-04-22: qty 2

## 2013-04-22 MED ORDER — DOXERCALCIFEROL 4 MCG/2ML IV SOLN
INTRAVENOUS | Status: AC
  Administered 2013-04-22: 1 ug
  Filled 2013-04-22: qty 2

## 2013-04-22 MED ORDER — HEPARIN SODIUM (PORCINE) 1000 UNIT/ML DIALYSIS: 20.0000 [IU]/kg | INTRAMUSCULAR | Status: DC | PRN

## 2013-04-22 MED ORDER — LIDOCAINE HCL (PF) 1 % IJ SOLN: 5.0000 mL | INTRAMUSCULAR | Status: DC | PRN

## 2013-04-22 MED ORDER — ALTEPLASE 2 MG IJ SOLR: 2.0000 mg | Freq: Once | INTRAMUSCULAR | Status: DC | PRN

## 2013-04-22 MED ORDER — SODIUM CHLORIDE 0.9 % IV SOLN: 100.0000 mL | INTRAVENOUS | Status: DC | PRN

## 2013-04-22 MED ORDER — MIDODRINE HCL 10 MG PO TABS: 10.0000 mg | ORAL_TABLET | Freq: Three times a day (TID) | ORAL | Status: DC

## 2013-04-22 MED ORDER — BOOST / RESOURCE BREEZE PO LIQD: 1.0000 | ORAL | Status: DC

## 2013-04-22 MED ORDER — HEPARIN SODIUM (PORCINE) 1000 UNIT/ML DIALYSIS
1000.0000 [IU] | INTRAMUSCULAR | Status: DC | PRN
  Administered 2013-04-22: 1000 [IU] via INTRAVENOUS_CENTRAL

## 2013-04-22 MED ORDER — NEPRO/CARBSTEADY PO LIQD: 237.0000 mL | ORAL | Status: DC | PRN

## 2013-04-22 MED ORDER — LIDOCAINE-PRILOCAINE 2.5-2.5 % EX CREA: 1.0000 "application " | TOPICAL_CREAM | CUTANEOUS | Status: DC | PRN

## 2013-04-22 NOTE — Progress Notes (Signed)
I have personally seen and examined this patient and agree with the assessment/plan as outlined above by [facility]. Plans for DC today if midodrine procured. Encouraged to see ophthalmology at [facility]MD 04/22/2013 9:59 AM

## 2013-04-22 NOTE — Discharge Summary (Signed)
Physician Discharge Summary  Alvin Daniels [id number] DOB: 26-Sep-1970 DOA: 04/15/2013  PCP: Lucrezia Starch, MD  Admit date: 04/15/2013 Discharge date: 04/22/2013  Time spent: 73minutes  Recommendations for Outpatient Follow-up:  1. Follow up with PCP in one week 2. Follow up with CBC and BMP in one week  Discharge Diagnoses:  Principal Problem:   Orthostatic hypotension Active Problems:   HIV INFECTION   DIABETES MELLITUS, TYPE II, UNCONTROLLED, W/RENAL COMPS   RENAL FAILURE, END STAGE   Hepatitis C carrier   Polyneuropathy in diabetes(357.2)   Syncope and collapse   Chronic diarrhea   Anemia of chronic renal failure   Orthostatic headache   Abdominal  pain, other specified site   Discharge Condition: IMPROVED.   Diet recommendation: regular  Filed Weights   04/20/13 1352 04/22/13 0714 04/22/13 1145  Weight: 50.9 kg (112 lb 3.4 oz) 50.6 kg (111 lb 8.8 oz) 50.5 kg (111 lb 5.3 oz)    History of present illness:  42 y/o hispanic pame with HIV AIDS ( not on medication due to lack of follow up by patient, hepc , DM with neuropathy, chr diarrhea, hypotension, rt bell's palsy, ESRD on HD (Tu, th sat) presented to ED with headache worsened on standing progressive for several weeks.    Hospital Course:  Orthostatic hypotension with headache  appears to be chronic and worsened now. Likely autonomic neuropathy with DM, AIDS and ESRD . Was placed on midodrine before but could not get it due to insurance issue.  IV fluids as needed. -CT head/ MRI brain without any abnormality  restarted midodrine 10 mg tid. His headache has improved now.  -CM working on getting subsidy for his midodrine prescription. If available , we will safely discharge him.  -  ESRD on TTS HD  -renal following. Getting hectoral  Abdominal pain  Patient reports diffuse abdominal pain for 2-3 days during hospital stay and has been nauseous. He also had few episodes of vomiting.  No further  diarrhea. CT scan of the abdomen and pelvis to rule out any colitis showed dilatation adn intra and extra hepatic biliary duct. This could be related to HIV colangiopathy. total bilirubin and lipase were normal. symptoms much improved. .  DM 2 with neuropathy and renal complications  -[id number] of 6.2  -  CBG (last 3)   Recent Labs   04/19/13 1706  04/19/13 2158  04/20/13 2323   GLUCAP  135*  129*  191*    History of syncope, collapse and head injury  - Likely precipitated by hypotension/orthostatic hypotension  - 2-D echo January 2014 showed LVEF 55-60%.  HIV infection/hepatitis C  -patient had not continue follow up due to insurance issues. ID following and plan to restart him on ART. CD4 of 260. viral load of 1700  Chronic diarrhea  Resolved.  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 BS+  Ext: warm, no edema  CNS: AAOX3    Discharge Instructions  Discharge Orders   Future Appointments Provider Department Dept Phone   05/04/2013 2:00 PM Truman Hayward, MD [facility] [phone]   Future Orders Complete By Expires   Discharge instructions  As directed    Comments:     Follow up with PCP in one week Follow up with renal as recommended.       Medication List         feeding supplement (RESOURCE BREEZE) Liqd  Take 1 Container by mouth daily.     insulin aspart protamine- aspart (70-30) 100 UNIT/ML injection  Commonly known  as:  NOVOLOG MIX 70/30  Inject 0.05 mLs (5 Units total) into the skin 2 (two) times daily with a meal.     midodrine 10 MG tablet  Commonly known as:  PROAMATINE  Take 1 tablet (10 mg total) by mouth 3 (three) times daily with meals.       No Known Allergies     Follow-up Information   Follow up with Lucrezia Starch, MD.   Specialty:  Nephrology   Contact information:   [address] [address] [phone]       Follow up with DUNHAM,CYNTHIA B, MD. Schedule an appointment as soon as possible for a visit in 1 week.   Specialty:  Nephrology   Contact information:   [address] [place] [address] [phone]        The results of significant diagnostics from this hospitalization (including imaging, microbiology, ancillary and laboratory) are listed below for reference.    Significant Diagnostic Studies: Dg Chest 2 View  04/15/2013   CLINICAL DATA:  Dizziness and facial droop  EXAM: CHEST  2 VIEW  COMPARISON:  Chest CT June 26, 2012 and chest radiograph June 25, 2012  FINDINGS: Lungs are clear. Heart size and pulmonary vascularity are normal. No adenopathy. No bone lesions.  IMPRESSION: No abnormality noted.   Electronically Signed   By: Lowella Grip M.D.   On: 04/15/2013 10:42   Ct Head Wo Contrast  04/15/2013   CLINICAL DATA:  Headache and dizziness status post fall.  EXAM: CT HEAD WITHOUT CONTRAST  TECHNIQUE: Contiguous axial images were obtained from the base of the skull through the vertex without intravenous contrast.  COMPARISON:  MRI of the brain dated August 06, 2012.  FINDINGS: The ventricles are normal in size and position. There is no intracranial hemorrhage nor intracranial mass effect. There is no evidence of an evolving ischemic infarction. The cerebellum and brainstem are normal in density.  At bone window settings the observed portions of the paranasal sinuses and mastoid air cells  are clear. There is no evidence of an acute skull fracture.  IMPRESSION: Normal noncontrast CT scan of the brain.   Electronically Signed   By: David  Martinique   On: 04/15/2013 08:29   Mr Brain Wo Contrast  04/15/2013   CLINICAL DATA:  Acute onset of dizziness and headache post fall. End-stage renal disease. Diabetic. HIV infection. Hepatitis-C.  EXAM: MRI HEAD WITHOUT CONTRAST  TECHNIQUE: Multiplanar, multiecho pulse sequences of the brain and surrounding structures were obtained without intravenous contrast.  COMPARISON:  04/15/2013 CT.  06/26/2012 MR.  FINDINGS: No acute infarct.  No intracranial hemorrhage.  No hydrocephalus.  No intracranial mass lesion noted on this unenhanced exam.  Small left vertebral artery as noted on prior exam. Major intracranial vascular structures are patent.  Abnormal bone marrow may reflect changes  of chronic disease/ anemia and noted previously.  Cerebellar tonsils minimally low lying but within range of normal limits. Mild deformity of the ventral aspect of the pons to the left of midline by ectatic vascular appears stable.  Cervical medullary junction, pineal region and orbital structures unremarkable.  IMPRESSION: No acute abnormality.  Please see above.   Electronically Signed   By: Chauncey Cruel M.D.   On: 04/15/2013 10:58   Ct Abdomen Pelvis W Contrast  04/19/2013   CLINICAL DATA:  Abdominal pain.  Tenderness.  EXAM: CT ABDOMEN AND PELVIS WITH CONTRAST  TECHNIQUE: Multidetector CT imaging of the abdomen and pelvis was performed using the standard protocol following bolus administration of intravenous contrast.  CONTRAST:  3mL OMNIPAQUE IOHEXOL 300 MG/ML  SOLN  COMPARISON:  CT 09/28/2009.  Renal ultrasound 03/14/2010.  FINDINGS: Lung Bases: Lingular scarring or atelectasis.  Liver: Mild respiratory motion is present on the abdominal portion of the examination. Study is mildly degraded because of respiratory motion. Minimal intrahepatic biliary ductal dilation is present.  This can be associated with sphincter of Oddi dysfunction. No obstructing lesion is identified. Negative for hepatic cirrhosis.  Spleen:  Normal.  Gallbladder:  Distended.  Common bile duct: No common duct stone is identified. The common bile duct is enlarged, measuring up to 9 mm.  Pancreas: Mild dilation of the pancreatic duct measuring up to 3 mm. No pancreatic mass lesions.  Adrenal glands:  Normal.  Kidneys: Renal atrophy compared to the prior exam. 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 No ureteral calculi are identified. Calcification present adjacent to the mid left ureter probably represents phleboliths in the adjacent gonadal vein.  Stomach: Patulous gastroesophageal junction. No inflammatory changes of the stomach.  Small bowel:  Within normal limits.  No mesenteric adenopathy.  Colon:   Normal appendix.  Redundant sigmoid.  Pelvic Genitourinary: Urinary bladder decompressed with mild mural thickening, likely secondary to collapse.  Bones:  No aggressive osseous lesions.  Vasculature: Normal aside from mild atherosclerosis.  Body Wall: Normal.  IMPRESSION: 1. Mild intrahepatic biliary ductal dilation with extra hepatic biliary ductal dilation and mild pancreatic ductal dilation. In this patient with HIV, this could be associated with HIV cholangiopathy or biliary sphincter dysfunction. If bilirubin is normal, no further evaluation is likely warranted. 2. Development of renal atrophy and medullary nephrocalcinosis. This has a large differential diagnosis ranging from renal tubular acidosis to medullary sponge kidney.   Electronically Signed   By: Dereck Ligas M.D.   On: 04/19/2013 16:03    Microbiology: Recent Results (from the past 240 hour(s))  MRSA PCR SCREENING     Status: None   Collection Time    04/15/13  2:59 PM      Result Value Range Status   MRSA by PCR NEGATIVE  NEGATIVE Final   Comment:            The GeneXpert MRSA Assay (FDA     approved for  NASAL specimens     only), is one component of a     comprehensive MRSA colonization     surveillance program. It is not     intended to diagnose MRSA     infection nor to guide or     monitor treatment for     MRSA infections.     Labs: Basic Metabolic Panel:  Recent Labs Lab 04/17/13 1119 04/20/13 0942 04/22/13 0727  NA 133* 133* 137  K 3.5 4.4 4.2  CL 95* 92* 99  CO2 23 23 24   GLUCOSE 163* 162* 121*  BUN 48* 54* 37*  CREATININE 9.12* 11.02* 9.66*  CALCIUM 8.6 8.8 8.6  PHOS 3.9 6.0* 4.7*   Liver Function Tests:  Recent Labs Lab 04/17/13 1119 04/19/13 1828 04/20/13 0942 04/22/13 0727  AST  --  50*  --   --   ALT  --  54*  --   --   ALKPHOS  --  90  --   --   BILITOT  --  0.5  --   --   PROT  --  8.4*  --   --   ALBUMIN 3.2* 3.3* 3.3* 3.1*    Recent Labs Lab 04/19/13 1345  LIPASE 78*   No results found for this basename: AMMONIA,  in the last 168 hours CBC:  Recent Labs Lab 04/17/13 1119 04/20/13 0942 04/22/13 0727  WBC 6.5 8.1 6.8  HGB 11.5* 12.2* 12.0*  HCT 33.0* 34.8* 34.4*  MCV 91.2 90.2 91.2  PLT 193 230 199   Cardiac Enzymes: No results found for this basename: CKTOTAL, CKMB, CKMBINDEX, TROPONINI,  in the last 168 hours BNP: BNP (last 3 results) No results found for this basename: PROBNP,  in the last 8760 hours CBG:  Recent Labs Lab 04/19/13 0736 04/19/13 1706 04/19/13 2158 04/20/13 2323 04/22/13 1224  GLUCAP 78 135* 129* 191* 90       Signed:  Ismelda Weatherman  [facility] 04/22/2013, 2:12 PM

## 2013-04-22 NOTE — Procedures (Signed)
Patient seen on Hemodialysis. QB 400, UF goal 0.5L (Keeping even). BP 97/58 Treatment adjusted as needed.  Elmarie Shiley MD [facility]. Office # [phone] Pager # [phone] 10:08 AM

## 2013-04-22 NOTE — Care Management Note (Signed)
   CARE MANAGEMENT NOTE 04/22/2013  Patient:  Alvin Daniels, Alvin Daniels   Account Number:  [id number]  Date Initiated:  04/21/2013  Documentation initiated by:  Lizabeth Leyden  Subjective/Objective Assessment:   admitted with hypotension  ESRD/HD     Action/Plan:   progression of care and discharge planning  04/22/2013 Pt setup with [facility] and wellness for [id number] @ [place] letter given to pt .   Anticipated DC Date:  04/22/2013   Anticipated DC Plan:  [place] Program  Follow-up appt scheduled      Choice offered to / List presented to:             Status of service:  Completed, signed off Medicare Important Message given?   (If response is "NO", the following Medicare IM given date fields will be blank) Date Medicare IM given:   Date Additional Medicare IM given:    Discharge Disposition:  HOME/SELF CARE  Per UR Regulation:    If discussed at Long Length of Stay Meetings, dates discussed:    Comments:  04/21/2013 [place], [place] Medication assistance  unable to afford medications, midodrine.  NCM met with patient and Spanish interpreter regarding medications. Patient is not eligible for [facility] program, he had received [place] letter 06/2012.  MATCH override for medication assistance.  Letter to be given to patient at discharge.

## 2013-04-22 NOTE — Progress Notes (Signed)
[facility] Progress Note  Subjective:   Headache is only "poquito"  Objective Filed Vitals:   04/22/13 0714 04/22/13 0745 04/22/13 0750 04/22/13 0800  BP: 132/87 142/89 154/96 118/68  Pulse: 88 83 86 83  Temp: 98.3 F (36.8 C)     TempSrc: Oral     Resp: 20 18 18 16   Height:      Weight: 50.6 kg (111 lb 8.8 oz)     SpO2: 96%      Physical Exam  ##  BP being checked in right thigh General: NAD BP better Heart: RRR Lungs: no wheezes or rales Abdomen: soft NT Extremities: no edema; old scars Dialysis Access: left lower AVF Qb 400  Dialysis Orders: TTS @ [facility]  4 hrs 53.5kg 400/A1.5 2K/2.25Ca Heparin 5300 U AVF @ LFA  Hectorol 1 mcg Epogen 0 Venofer 0   Assessment/Plan:  1. Headaches/blurred vision - resumed Midodrine (unclear if this will be a sustainable medication for him to take without payer source for prescriptions). No acute intra-cranial pathology and Dr Posey Pronto suspects that his headaches may be primarily due to his vision problems (right eye>left eye). Possible DC after clear disposition plan worked out for drug assistance. Improved today. 2. ESRD - TTS labs pending keeping even on HD weight down 50s pre HD - will change outpt weight at d/c NA normal; K 4.2 - keep on 4 K bath due to diarrhea hx and I think he will feel better. 3. Anemia - Hgb 12.2 - no ESA yet  4. Secondary hyperparathyroidism - on hectorol - no binder P up to 6 - to be followed at dialysis -  5. HTN/volume - chronic hypotension keeping even; pre D weight 51.2 - below EDW - though he is often below EDW at outpt center - + 415 during tmt Tuesday; BP higher today with lower volume - keeping even  6. Nutrition - will change to regular diet due to chronic diarrhea, volulme and BP not an issue//Breeze - add vits - intake poor  7. HIV - not on HAART - seen by ID For f /u in clinic - hopefully treatment could improve chronic GI issues  8. Hep C + ^ LFTs - nml bili   Myriam Jacobson, PA-C [facility] Beeper [phone] 04/22/2013,8:35 AM  LOS: 7 days    Additional Objective Labs: Basic Metabolic Panel:  Recent Labs Lab 04/17/13 1119 04/20/13 0942 04/22/13 0727  NA 133* 133* 137  K 3.5 4.4 4.2  CL 95* 92* 99  CO2 23 23 24   GLUCOSE 163* 162* 121*  BUN 48* 54* 37*  CREATININE 9.12* 11.02* 9.66*  CALCIUM 8.6 8.8 8.6  PHOS 3.9 6.0* 4.7*   Liver Function Tests:  Recent Labs Lab 04/19/13 1828 04/20/13 0942 04/22/13 0727  AST 50*  --   --   ALT 54*  --   --   ALKPHOS 90  --   --   BILITOT 0.5  --   --   PROT 8.4*  --   --   ALBUMIN 3.3* 3.3* 3.1*    Recent Labs Lab 04/19/13 1345  LIPASE 78*   CBC:  Recent Labs Lab 04/17/13 1119 04/20/13 0942 04/22/13 0727  WBC 6.5 8.1 6.8  HGB 11.5* 12.2* 12.0*  HCT 33.0* 34.8* 34.4*  MCV 91.2 90.2 91.2  PLT 193 230 199  CBG:  Recent Labs Lab 04/18/13 2229 04/19/13 0736 04/19/13 1706 04/19/13 2158 04/20/13 2323  GLUCAP 123* 78 135* 129*  191*  Medications:   . doxercalciferol  1 mcg Intravenous Q T,Th,Sa-HD  . feeding supplement (RESOURCE BREEZE)  1 Container Oral Q24H  . heparin  5,000 Units Subcutaneous Q8H  . insulin aspart protamine- aspart  5 Units Subcutaneous BID WC  . midodrine  10 mg Oral TID WC  . multivitamin  1 tablet Oral QHS  . sodium chloride  3 mL Intravenous Q12H

## 2013-04-22 NOTE — Progress Notes (Signed)
Patient discharge Home per Md order.  Discharge instructions in spanish reviewed with patient and family. Family members present that speak english and are able to interpret for patient.  Copies of all forms given and explained. Patient/family voiced understanding of all instructions.  Discharge in no acute distress. Adelene Idler, RN [facility], BSN, MSN

## 2013-05-04 ENCOUNTER — Inpatient Hospital Stay: Payer: Self-pay | Admitting: Infectious Disease

## 2013-05-05 ENCOUNTER — Inpatient Hospital Stay: Payer: Self-pay | Admitting: Infectious Disease

## 2013-05-21 ENCOUNTER — Inpatient Hospital Stay: Payer: Self-pay | Admitting: Internal Medicine

## 2013-05-24 ENCOUNTER — Other Ambulatory Visit: Payer: Self-pay | Admitting: *Deleted

## 2013-05-24 ENCOUNTER — Ambulatory Visit (INDEPENDENT_AMBULATORY_CARE_PROVIDER_SITE_OTHER): Payer: Medicaid Other | Admitting: Infectious Diseases

## 2013-05-24 ENCOUNTER — Encounter: Payer: Self-pay | Admitting: Infectious Diseases

## 2013-05-24 VITALS — BP 106/73 | HR 94 | Temp 97.5°F | Wt 110.0 lb

## 2013-05-24 DIAGNOSIS — E119 Type 2 diabetes mellitus without complications: Secondary | ICD-10-CM

## 2013-05-24 DIAGNOSIS — B20 Human immunodeficiency virus [HIV] disease: Secondary | ICD-10-CM

## 2013-05-24 DIAGNOSIS — N186 End stage renal disease: Secondary | ICD-10-CM

## 2013-05-24 DIAGNOSIS — R55 Syncope and collapse: Secondary | ICD-10-CM

## 2013-05-24 DIAGNOSIS — E1165 Type 2 diabetes mellitus with hyperglycemia: Secondary | ICD-10-CM

## 2013-05-24 DIAGNOSIS — E1129 Type 2 diabetes mellitus with other diabetic kidney complication: Secondary | ICD-10-CM

## 2013-05-24 DIAGNOSIS — I959 Hypotension, unspecified: Secondary | ICD-10-CM

## 2013-05-24 MED ORDER — INSULIN SYRINGES (DISPOSABLE) U-100 0.5 ML MISC: Status: DC

## 2013-05-24 NOTE — Assessment & Plan Note (Addendum)
Will refill his syringes. Will get him into PCP- he has community health and welllness f/u on 12-16.

## 2013-05-24 NOTE — Assessment & Plan Note (Signed)
Will try to get him into dental. Offered condoms. Will try to get him back onto ART. He has met with ADAP counselor. Flu shot uptodate.

## 2013-05-24 NOTE — Progress Notes (Signed)
   Subjective:    Patient ID: Alvin Daniels, male    DOB: 05-02-1971, 43 y.o.   MRN: [id number]  HPI 43 yo M with DM, ESRD, and HIV+. Was in hospital 12-4 to 04-22-13 due to repeat episode of hypotension, falls. He was started on midodrine and was able to be d/c home with this.  Sometimes has headache, feels like his vision gets blurry. Has passed out 3 times since hospital d/c. Has been out of his insulin, his FSG was 86 yesterday.  Was previously on DRVr/TFV/EMT.  Has been getting HD, no problems with his HD site.   HIV 1 RNA Quant (copies/mL)  Date Value  04/15/2013 1705*  06/17/2012 4332*  03/29/2011 11400*     CD4 T Cell Abs (/uL)  Date Value  04/15/2013 260*  06/17/2012 300*  01/11/2011 390*   Asks for insulin needles.   Review of Systems     Objective:   Physical Exam  Constitutional: He appears well-developed and well-nourished.  HENT:  Mouth/Throat: No oropharyngeal exudate.  Eyes: EOM are normal. Pupils are equal, round, and reactive to light.  Neck: Neck supple.  Cardiovascular: Normal rate, regular rhythm and normal heart sounds.   Pulmonary/Chest: Effort normal and breath sounds normal.  Abdominal: Soft. Bowel sounds are normal. He exhibits no distension. There is no tenderness.  Lymphadenopathy:    He has no cervical adenopathy.          Assessment & Plan:

## 2013-05-24 NOTE — Assessment & Plan Note (Signed)
Appreciate renal f/u. Continue midodrine.

## 2013-05-24 NOTE — Assessment & Plan Note (Signed)
Will continue midodrine. Appreciate renal f/u. Second BP reading today improved (SBP 100s).

## 2013-05-24 NOTE — Assessment & Plan Note (Signed)
His AVF is clean, non-tender, no erythema. Bruit+. Appreciate renal f/u.

## 2013-05-25 ENCOUNTER — Other Ambulatory Visit: Payer: Self-pay | Admitting: Infectious Diseases

## 2013-05-25 DIAGNOSIS — B20 Human immunodeficiency virus [HIV] disease: Secondary | ICD-10-CM

## 2013-05-25 MED ORDER — EMTRICITABINE 200 MG PO CAPS: ORAL_CAPSULE | ORAL | Status: DC

## 2013-05-25 MED ORDER — DARUNAVIR ETHANOLATE 400 MG PO TABS: 800.0000 mg | ORAL_TABLET | Freq: Every day | ORAL | Status: DC

## 2013-05-25 MED ORDER — TENOFOVIR DISOPROXIL FUMARATE 300 MG PO TABS: ORAL_TABLET | ORAL | Status: DC

## 2013-05-25 MED ORDER — RITONAVIR 100 MG PO TABS: 100.0000 mg | ORAL_TABLET | Freq: Every day | ORAL | Status: DC

## 2013-05-28 ENCOUNTER — Inpatient Hospital Stay: Payer: Self-pay

## 2013-06-02 ENCOUNTER — Inpatient Hospital Stay: Payer: Self-pay | Admitting: Internal Medicine

## 2013-06-04 ENCOUNTER — Ambulatory Visit: Payer: Self-pay

## 2013-06-21 ENCOUNTER — Ambulatory Visit (INDEPENDENT_AMBULATORY_CARE_PROVIDER_SITE_OTHER): Payer: Medicaid Other | Admitting: Infectious Diseases

## 2013-06-21 ENCOUNTER — Encounter: Payer: Self-pay | Admitting: Infectious Diseases

## 2013-06-21 ENCOUNTER — Other Ambulatory Visit: Payer: Self-pay | Admitting: *Deleted

## 2013-06-21 VITALS — BP 100/66 | HR 83 | Temp 97.6°F | Ht 61.02 in | Wt 112.0 lb

## 2013-06-21 DIAGNOSIS — B20 Human immunodeficiency virus [HIV] disease: Secondary | ICD-10-CM

## 2013-06-21 DIAGNOSIS — E1129 Type 2 diabetes mellitus with other diabetic kidney complication: Secondary | ICD-10-CM

## 2013-06-21 DIAGNOSIS — N186 End stage renal disease: Secondary | ICD-10-CM

## 2013-06-21 DIAGNOSIS — E1165 Type 2 diabetes mellitus with hyperglycemia: Secondary | ICD-10-CM

## 2013-06-21 DIAGNOSIS — I959 Hypotension, unspecified: Secondary | ICD-10-CM

## 2013-06-21 DIAGNOSIS — H539 Unspecified visual disturbance: Secondary | ICD-10-CM

## 2013-06-21 MED ORDER — DARUNAVIR ETHANOLATE 400 MG PO TABS: 800.0000 mg | ORAL_TABLET | Freq: Every day | ORAL | Status: DC

## 2013-06-21 MED ORDER — RITONAVIR 100 MG PO TABS: 100.0000 mg | ORAL_TABLET | Freq: Every day | ORAL | Status: DC

## 2013-06-21 MED ORDER — EMTRICITABINE 200 MG PO CAPS: ORAL_CAPSULE | ORAL | Status: DC

## 2013-06-21 MED ORDER — TENOFOVIR DISOPROXIL FUMARATE 300 MG PO TABS: ORAL_TABLET | ORAL | Status: DC

## 2013-06-21 NOTE — Assessment & Plan Note (Addendum)
He is off meds. Needs to f/u with med assistance here in clinic to get his rx straightened out. Will get him in with dental. Will see him back in 1 month.

## 2013-06-21 NOTE — Assessment & Plan Note (Signed)
He continues on his midodrine. Greatly appreciate renal f/u.

## 2013-06-21 NOTE — Assessment & Plan Note (Signed)
He needs to get back in with PCP.

## 2013-06-21 NOTE — Progress Notes (Signed)
   Subjective:    Patient ID: Alvin Daniels, male    DOB: [birth year], 43 y.o.   MRN: [id number]  HPI 43 yo M with DM, ESRD, and HIV+. Was in hospital 12-4 to 04-22-13 due to repeat episode of hypotension, falls. He was started on midodrine and was able to be d/c home with this.  Has been out of insulin.  Was previously on DRVr/TFV/EMT. Has been off meds for last 3 weeks.  Has been getting HD, no problems with his HD site.  Has had problems with L eye. Has been painful. No visual acuity problems.   HIV 1 RNA Quant (copies/mL)  Date Value  04/15/2013 1705*  06/17/2012 4332*  03/29/2011 11400*     CD4 T Cell Abs (/uL)  Date Value  04/15/2013 260*  06/17/2012 300*  01/11/2011 390*   Has occas chest pain when eating.   Review of Systems  Constitutional: Positive for unexpected weight change. Negative for fever and appetite change.  Eyes: Positive for visual disturbance.  Respiratory: Negative for shortness of breath.   Cardiovascular: Negative for chest pain.  Gastrointestinal: Positive for diarrhea. Negative for constipation.  Genitourinary: Negative for difficulty urinating.  Neurological: Negative for headaches.       Objective:   Physical Exam  Constitutional: He appears well-developed and well-nourished.  HENT:  Mouth/Throat: No oropharyngeal exudate.  Eyes: EOM are normal. Pupils are equal, round, and reactive to light.    Neck: Neck supple.  Cardiovascular: Normal rate, regular rhythm and normal heart sounds.   Pulmonary/Chest: Effort normal and breath sounds normal.  Abdominal: Soft. Bowel sounds are normal. There is no tenderness. There is no rebound.  Musculoskeletal:       Arms: Lymphadenopathy:    He has no cervical adenopathy.          Assessment & Plan:

## 2013-06-21 NOTE — Assessment & Plan Note (Signed)
Greatly appreciate continued HD, renal f/u.

## 2013-06-22 ENCOUNTER — Ambulatory Visit: Payer: Self-pay

## 2013-07-10 DIAGNOSIS — R52 Pain, unspecified: Secondary | ICD-10-CM | POA: Insufficient documentation

## 2013-07-13 ENCOUNTER — Telehealth: Payer: Self-pay | Admitting: *Deleted

## 2013-07-13 NOTE — Telephone Encounter (Signed)
Faxed referral to Robinette Haines at [facility].  Pt received [employer] 07/09/13.  Referral requests for Ophthalmology, Neurology, PCP/DM care. Landis Gandy, RN

## 2013-07-26 ENCOUNTER — Encounter: Payer: Self-pay | Admitting: Infectious Diseases

## 2013-07-26 ENCOUNTER — Ambulatory Visit (INDEPENDENT_AMBULATORY_CARE_PROVIDER_SITE_OTHER): Payer: Self-pay | Admitting: Infectious Diseases

## 2013-07-26 ENCOUNTER — Other Ambulatory Visit: Payer: Self-pay | Admitting: *Deleted

## 2013-07-26 VITALS — BP 84/50 | HR 84 | Temp 97.7°F | Ht 61.5 in | Wt 118.0 lb

## 2013-07-26 DIAGNOSIS — I959 Hypotension, unspecified: Secondary | ICD-10-CM

## 2013-07-26 DIAGNOSIS — B20 Human immunodeficiency virus [HIV] disease: Secondary | ICD-10-CM

## 2013-07-26 DIAGNOSIS — E1165 Type 2 diabetes mellitus with hyperglycemia: Secondary | ICD-10-CM

## 2013-07-26 DIAGNOSIS — N186 End stage renal disease: Secondary | ICD-10-CM

## 2013-07-26 DIAGNOSIS — E1129 Type 2 diabetes mellitus with other diabetic kidney complication: Secondary | ICD-10-CM

## 2013-07-26 DIAGNOSIS — R109 Unspecified abdominal pain: Secondary | ICD-10-CM

## 2013-07-26 MED ORDER — PANTOPRAZOLE SODIUM 40 MG PO TBEC: 40.0000 mg | DELAYED_RELEASE_TABLET | Freq: Every day | ORAL | Status: DC

## 2013-07-26 NOTE — Assessment & Plan Note (Signed)
Will check genotype, VL today. Still detectable... Offer condoms. Hep B #2.  rtc 6 weeks.

## 2013-07-26 NOTE — Assessment & Plan Note (Signed)
Appreciate HD f/u. Hopefully HD can help him with getting his midodrine.

## 2013-07-26 NOTE — Assessment & Plan Note (Signed)
Will try to get him into the [facility] program to get him help with his rxs.

## 2013-07-26 NOTE — Assessment & Plan Note (Signed)
Will try to get him into PCP. He can get midodrine for $20/month. [facility] is working with him on this.

## 2013-07-26 NOTE — Progress Notes (Signed)
   Subjective:    Patient ID: Alvin Daniels, male    DOB: 07-02-1970, 43 y.o.   MRN: [id number]  HPI 43 yo M with DM, ESRD, and HIV+. Was in hospital 12-4 to 04-22-13 due to repeat episode of hypotension, falls. He was started on midodrine and was able to be d/c home with this.  Has been out of midodrine, rationing his insulin due to cost.  Was previously on DRVr/TFV/EMT. Has been having GI upset after eating. Lasts for 30-60 minutes. Burning pain. Unchanged with different types of food. Has tried tylenol for this without relief.  Has been going to HD, no problems with this.   HIV 1 RNA Quant (copies/mL)  Date Value  04/15/2013 1705*  06/17/2012 4332*  03/29/2011 11400*     CD4 T Cell Abs (/uL)  Date Value  04/15/2013 260*  06/17/2012 300*  01/11/2011 390*      Review of Systems  Constitutional: Negative for appetite change and unexpected weight change.  Gastrointestinal: Positive for abdominal pain. Negative for diarrhea and constipation.  Genitourinary: Negative for difficulty urinating.       Objective:   Physical Exam  Constitutional: He appears well-developed and well-nourished.  HENT:  Mouth/Throat: No oropharyngeal exudate.  Eyes: EOM are normal. Pupils are equal, round, and reactive to light.  Neck: Neck supple.  Cardiovascular: Normal rate, regular rhythm and normal heart sounds.   Pulmonary/Chest: Effort normal and breath sounds normal.  Abdominal: Soft. Bowel sounds are normal. He exhibits no distension. There is no tenderness.  Musculoskeletal: He exhibits no edema.  Lymphadenopathy:    He has no cervical adenopathy.  Skin:             Assessment & Plan:

## 2013-07-26 NOTE — Assessment & Plan Note (Addendum)
Will start him on PPI. Likely related to DM.

## 2013-07-27 LAB — HIV-1 RNA ULTRAQUANT REFLEX TO GENTYP+
HIV 1 RNA Quant: <20 copies/mL (ref <20)
HIV-1 RNA Quant, Log: <1.3 {Log} (ref <1.30)

## 2013-07-29 ENCOUNTER — Telehealth: Payer: Self-pay | Admitting: *Deleted

## 2013-07-29 NOTE — Telephone Encounter (Signed)
Septra DS was removed from the pt's profile Feb. 2015 when pt came to ED.  Does Septra DS need to be continued.

## 2013-07-29 NOTE — Telephone Encounter (Signed)
Done.  D/Ced at the pharmacy.

## 2013-07-29 NOTE — Telephone Encounter (Signed)
Stop septra, CD4 > 200

## 2013-07-30 ENCOUNTER — Other Ambulatory Visit: Payer: Self-pay | Admitting: Infectious Diseases

## 2013-07-30 DIAGNOSIS — R109 Unspecified abdominal pain: Secondary | ICD-10-CM

## 2013-07-30 MED ORDER — OMEPRAZOLE 40 MG PO CPDR: 40.0000 mg | DELAYED_RELEASE_CAPSULE | Freq: Every day | ORAL | Status: DC

## 2013-08-17 ENCOUNTER — Telehealth: Payer: Self-pay | Admitting: *Deleted

## 2013-08-17 NOTE — Telephone Encounter (Signed)
Alana RN with Dr. Lorrene [facility], [facility] called to let Dr. [name] know patient did not receive Midodrine for his hypotension. Explained that he was suppose to receive this through patient assistance and that his case worker with [facility], [place] processed the application. Gave her Victoria's phone number [phone]. Patient is receiving the Hep B series at dialysis and they administer 4 doses, he is due for the last in July 2015; he also received prevnar 13, this was updated in chart. Myrtis Hopping

## 2013-08-25 ENCOUNTER — Ambulatory Visit: Payer: Medicaid Other | Admitting: Internal Medicine

## 2013-09-02 ENCOUNTER — Encounter: Payer: Self-pay | Admitting: Licensed Clinical Social Worker

## 2013-09-06 ENCOUNTER — Ambulatory Visit: Payer: Self-pay | Admitting: Infectious Diseases

## 2013-09-08 ENCOUNTER — Encounter: Payer: Self-pay | Admitting: Licensed Clinical Social Worker

## 2013-09-08 NOTE — Progress Notes (Signed)
CM- Alvin Daniels assigned to CT until 05/2014

## 2013-09-27 ENCOUNTER — Emergency Department (HOSPITAL_COMMUNITY): Payer: Medicaid Other

## 2013-09-27 ENCOUNTER — Emergency Department (HOSPITAL_COMMUNITY)
Admission: EM | Admit: 2013-09-27 | Discharge: 2013-09-27 | Disposition: A | Discharge status: Home / Self Care | Payer: Medicaid Other | Attending: Emergency Medicine | Admitting: Emergency Medicine

## 2013-09-27 ENCOUNTER — Encounter (HOSPITAL_COMMUNITY): Payer: Self-pay | Admitting: Emergency Medicine

## 2013-09-27 DIAGNOSIS — Z862 Personal history of diseases of the blood and blood-forming organs and certain disorders involving the immune mechanism: Secondary | ICD-10-CM | POA: Insufficient documentation

## 2013-09-27 DIAGNOSIS — Z21 Asymptomatic human immunodeficiency virus [HIV] infection status: Secondary | ICD-10-CM | POA: Insufficient documentation

## 2013-09-27 DIAGNOSIS — I951 Orthostatic hypotension: Secondary | ICD-10-CM | POA: Insufficient documentation

## 2013-09-27 DIAGNOSIS — E119 Type 2 diabetes mellitus without complications: Secondary | ICD-10-CM | POA: Insufficient documentation

## 2013-09-27 DIAGNOSIS — Z79899 Other long term (current) drug therapy: Secondary | ICD-10-CM | POA: Diagnosis not present

## 2013-09-27 DIAGNOSIS — Z8614 Personal history of Methicillin resistant Staphylococcus aureus infection: Secondary | ICD-10-CM | POA: Diagnosis not present

## 2013-09-27 DIAGNOSIS — Z8639 Personal history of other endocrine, nutritional and metabolic disease: Secondary | ICD-10-CM | POA: Insufficient documentation

## 2013-09-27 DIAGNOSIS — R404 Transient alteration of awareness: Secondary | ICD-10-CM | POA: Diagnosis present

## 2013-09-27 DIAGNOSIS — R55 Syncope and collapse: Secondary | ICD-10-CM

## 2013-09-27 DIAGNOSIS — N186 End stage renal disease: Secondary | ICD-10-CM | POA: Diagnosis not present

## 2013-09-27 DIAGNOSIS — Z992 Dependence on renal dialysis: Secondary | ICD-10-CM | POA: Insufficient documentation

## 2013-09-27 DIAGNOSIS — Z8619 Personal history of other infectious and parasitic diseases: Secondary | ICD-10-CM | POA: Diagnosis not present

## 2013-09-27 DIAGNOSIS — Z8739 Personal history of other diseases of the musculoskeletal system and connective tissue: Secondary | ICD-10-CM | POA: Insufficient documentation

## 2013-09-27 DIAGNOSIS — Z8719 Personal history of other diseases of the digestive system: Secondary | ICD-10-CM | POA: Insufficient documentation

## 2013-09-27 LAB — BASIC METABOLIC PANEL
BUN: 34 mg/dL — AB (ref 6–23)
CALCIUM: 9.4 mg/dL (ref 8.4–10.5)
CO2: 18 mEq/L — ABNORMAL LOW (ref 19–32)
CREATININE: 10.36 mg/dL — AB (ref 0.50–1.35)
Chloride: 102 mEq/L (ref 96–112)
GFR calc Af Amer: 6 mL/min — ABNORMAL LOW (ref >90)
GFR, EST NON AFRICAN AMERICAN: 5 mL/min — AB (ref >90)
GLUCOSE: 78 mg/dL (ref 70–99)
Potassium: 3.9 mEq/L (ref 3.7–5.3)
SODIUM: 139 meq/L (ref 137–147)

## 2013-09-27 LAB — CBC WITH DIFFERENTIAL/PLATELET
Basophils Absolute: 0 10*3/uL (ref 0.0–0.1)
Basophils Relative: 0 % (ref 0–1)
EOS ABS: 0.2 10*3/uL (ref 0.0–0.7)
EOS PCT: 2 % (ref 0–5)
HCT: 38.9 % — ABNORMAL LOW (ref 39.0–52.0)
Hemoglobin: 13.3 g/dL (ref 13.0–17.0)
Lymphocytes Relative: 23 % (ref 12–46)
Lymphs Abs: 2.2 10*3/uL (ref 0.7–4.0)
MCH: 33.1 pg (ref 26.0–34.0)
MCHC: 34.2 g/dL (ref 30.0–36.0)
MCV: 96.8 fL (ref 78.0–100.0)
Monocytes Absolute: 0.7 10*3/uL (ref 0.1–1.0)
Monocytes Relative: 8 % (ref 3–12)
Neutro Abs: 6.1 10*3/uL (ref 1.7–7.7)
Neutrophils Relative %: 67 % (ref 43–77)
PLATELETS: 246 10*3/uL (ref 150–400)
RBC: 4.02 MIL/uL — AB (ref 4.22–5.81)
RDW: 13.8 % (ref 11.5–15.5)
WBC: 9.3 10*3/uL (ref 4.0–10.5)

## 2013-09-27 LAB — CBG MONITORING, ED: Glucose-Capillary: 81 mg/dL (ref 70–99)

## 2013-09-27 MED ORDER — SODIUM CHLORIDE 0.9 % IV BOLUS (SEPSIS)
500.0000 mL | Freq: Once | INTRAVENOUS | Status: AC
  Administered 2013-09-27: 500 mL via INTRAVENOUS

## 2013-09-27 NOTE — ED Provider Notes (Signed)
CSN: [id number]     Arrival date & time 09/27/13  1430 History   None    Chief Complaint  Patient presents with  . Loss of Consciousness     Patient is a 43 year old male with past medical history as below relevant for HIV, hepatitis C, end-stage renal disease, and diabetes who presents with complaints of loss of consciousness. Patient reports that he was eating lunch and afterwards had a syncopal episode. He is unaware of duration. Right before syncopal episode he had onset of moderate neck pain and headache. Currently he has no complaints. Specifically he denies chest pain, shortness of breath, nausea, vomiting, diarrhea, or other symptoms. He had dialysis 2 days ago as scheduled. Patient has a history of orthostatic hypotension and HAS for which he has been admitted to the hospital in the past.   (Consider location/radiation/quality/duration/timing/severity/associated sxs/prior Treatment) Patient is a 43 y.o. male presenting with syncope. The history is provided by the patient and medical records. No language interpreter was used.  Loss of Consciousness Episode history:  Single Most recent episode:  Today Timing:  Sporadic Progression:  Resolved Chronicity:  Recurrent Witnessed: no   Worsened by:  Nothing tried Ineffective treatments:  None tried Associated symptoms: no chest pain, no confusion, no diaphoresis, no nausea, no shortness of breath, no vomiting and no weakness   Risk factors: vascular disease     Past Medical History  Diagnosis Date  . ESRD (end stage renal disease) on dialysis   . Hepatitis C   . Dialysis patient   . MRSA infection   . Pancreatitis   . Chronic diarrhea   . HIV INFECTION 06/27/2010    Qualifier: Diagnosis of  By: Nickola Major CMA ( [place]), Geni Bers    . Hypotension 06/02/2012  . Metabolic bone disease [id number]  . Normocytic anemia 06/17/2012  . Severe protein-calorie malnutrition 06/17/2012  . DM 06/27/2010    Annotation: uncontrolled Qualifier:  Diagnosis of  By: Nickola Major CMA ( AAMA), Geni Bers    . [id number])    Past Surgical History  Procedure Laterality Date  . Av fistula placement Left    Family History  Problem Relation Age of Onset  . Diabetes Mother   . Diabetes Father    History  Substance Use Topics  . Smoking status: Never Smoker   . Smokeless tobacco: Never Used  . Alcohol Use: No    Review of Systems  Constitutional: Negative for diaphoresis.  Respiratory: Negative for shortness of breath.   Cardiovascular: Positive for syncope. Negative for chest pain.  Gastrointestinal: Negative for nausea and vomiting.  Neurological: Negative for weakness.  Psychiatric/Behavioral: Negative for confusion.  All other systems reviewed and are negative.     Allergies  Review of patient's allergies indicates no known allergies.  Home Medications   Prior to Admission medications   Medication Sig Start Date End Date Taking? Authorizing Provider  darunavir (PREZISTA) 400 MG tablet Take 2 tablets (800 mg total) by mouth daily with breakfast. 06/21/13 06/21/14 Yes Campbell Riches, MD  emtricitabine (EMTRIVA) 200 MG capsule One capsule by mouth every Tuesday, and Saturday following dialysis. 06/21/13  Yes Campbell Riches, MD  omeprazole (PRILOSEC) 40 MG capsule Take 1 capsule (40 mg total) by mouth daily. 07/30/13  Yes Campbell Riches, MD  pantoprazole (PROTONIX) 40 MG tablet Take 40 mg by mouth daily.   Yes Historical Provider, MD  ritonavir (NORVIR) 100 MG TABS tablet Take 1 tablet (100 mg total) by mouth daily with breakfast. must  take with Prezista. 06/21/13  Yes Campbell Riches, MD  tenofovir Veva Holes) 300 MG tablet One tablet by mouth weekly every Sunday. 06/21/13  Yes Campbell Riches, MD   BP 91/65  Pulse 86  Temp(Src) 97.6 F (36.4 C) (Oral)  Resp 15  SpO2 99% Physical Exam  Nursing note and vitals reviewed. Constitutional: He is oriented to person, place, and time. He appears well-developed and  well-nourished.  HENT:  Head: Normocephalic and atraumatic.  Right Ear: External ear normal.  Left Ear: External ear normal.  Eyes: Conjunctivae are normal. Pupils are equal, round, and reactive to light.  Neck: Normal range of motion. Neck supple.  Cardiovascular: Normal rate, regular rhythm, normal heart sounds and intact distal pulses.   Pulmonary/Chest: Effort normal and breath sounds normal.  Abdominal: Soft. Bowel sounds are normal. He exhibits no distension. There is no tenderness.  Musculoskeletal: Normal range of motion. He exhibits no edema and no tenderness.  Neurological: He is alert and oriented to person, place, and time. He has normal strength. He displays no tremor. No cranial nerve deficit or sensory deficit. He exhibits normal muscle tone. He displays no seizure activity. Coordination and gait normal. GCS eye subscore is 4. GCS verbal subscore is 5. GCS motor subscore is 6.  Skin: Skin is warm and dry.  Psychiatric: He has a normal mood and affect.    ED Course  Procedures (including critical care time) Labs Review Labs Reviewed  CBC WITH DIFFERENTIAL - Abnormal; Notable for the following:    RBC 4.02 (*)    HCT 38.9 (*)    All other components within normal limits  BASIC METABOLIC PANEL - Abnormal; Notable for the following:    CO2 18 (*)    BUN 34 (*)    Creatinine, Ser 10.36 (*)    GFR calc non Af Amer 5 (*)    GFR calc Af Amer 6 (*)    All other components within normal limits  CBG MONITORING, ED    Imaging Review Dg Chest 2 View  09/27/2013   CLINICAL DATA:  Syncope, history end-stage renal disease on dialysis, hepatitis, HIV, diabetes  EXAM: CHEST  2 VIEW  COMPARISON:  04/15/2013  FINDINGS: Normal heart size, mediastinal contours, and pulmonary vascularity.  Minimal chronic peribronchial thickening.  Lungs clear.  No pleural effusion or pneumothorax.  Bones demineralized.  Subacute fracture of the lateral RIGHT ninth rib, also present on prior exam.   IMPRESSION: No acute abnormalities.   Electronically Signed   By: Lavonia Dana M.D.   On: 09/27/2013 16:05   Ct Head Wo Contrast  09/27/2013   CLINICAL DATA:  Loss of consciousness  EXAM: CT HEAD WITHOUT CONTRAST  TECHNIQUE: Contiguous axial images were obtained from the base of the skull through the vertex without intravenous contrast.  COMPARISON:  Brain CT April 15, 2013 and brain MRI April 15, 2013  FINDINGS: The ventricles are normal in size and configuration. There is no appreciable mass, hemorrhage, extra-axial fluid collection or midline shift. Gray-white compartments are normal. There is no demonstrable acute infarct. Bony calvarium appears intact. The mastoid air cells are clear.  IMPRESSION: Study within normal limits. No demonstrable mass, hemorrhage, or acute appearing infarct.   Electronically Signed   By: Lowella Grip M.D.   On: 09/27/2013 16:08     EKG Interpretation   Date/Time:  Monday Sep 27 2013 14:32:05 EDT Ventricular Rate:  84 PR Interval:  135 QRS Duration: 81 QT Interval:  389 QTC  Calculation: 460 R Axis:   53 Text Interpretation:  Age not entered, assumed to be  43 years old for  purpose of ECG interpretation Sinus rhythm ST elev, probable normal early  repol pattern Confirmed by ZAVITZ  MD, JOSHUA ([id number]) on 09/27/2013 4:16:51  PM        Date: 09/27/2013  Rate: 84  Rhythm: normal sinus rhythm  QRS Axis: normal  Intervals: normal  ST/T Wave abnormalities: early repolarization  Conduction Disutrbances:none  Narrative Interpretation:   Old EKG Reviewed: unchanged    MDM   Final diagnoses:  Orthostatic hypotension  Syncope    Patient presents to the emergency department after syncopal episode. His vital signs were remarkable for no fever, no tachycardia, and no hypotension. Physical exam as above and overall patient appeared well, no source of focal infection, and no neurological deficits. Patient was found to be orthostatic and he was given IV  fluids. Afterwards he felt much better was able to ambulate without symptoms. Review of EKG, chest x-ray, head CT, and other labs are unremarkable. Given that patient has a history of orthostatic hypotension, blood pressure improved with IV fluids, and asymptomatic with ambulation will discharge. We arranged close PCP followup in 2 days and patient will followup accordingly.  He was given instructions to increase PO intake and voiced understanding.      Corlis Leak, MD 09/28/13 [phone]

## 2013-09-27 NOTE — Progress Notes (Signed)
ED CM noted patient to be without PCP. Spoke with Alvin Daniels [employer] Liaison concerning f/u care and Medicaid status. Pt presented to [facility] ED with Syncope. Hx of HIV, hep C,  and  ESRD on HD Tues/Thur/ Sat at [facility] Pt reports that he has Medicaid but does not have the card. Discussed the importance of PCP. Pt reports he had not gotten an appointment with anyone except [facility], since his  discharge last December. An appointment was set up then as per record. Offered to assist with f/u care pt agreeable.  Contacted the [facility] scheduled an appt Friday 5/22 at 1030 for f/u. Pt aware of the date and time, brochure given stressed the importance of patient keeping appointment, and if he is unable to make it he will call. Verbalized understanding and appreciates the assistance with f/u care. Also given phone number and address to DSS in [place] to contact concerning his Medicaid Card. Pt states, he will have a family member call to follow up. Discussed f/u plan with Dr. Reather Converse, who agrees with plan. Pt was re-evaluated by EDP and in no apparent distress plan for discharge to self care with instructions. No further ED CM needs  identified.

## 2013-09-27 NOTE — Discharge Planning (Signed)
[place] Liaison  Patient is an active orange card holder [facility]. Medicaid application in [place] was given as well as a Warden/ranger.  Pt was also given my contact information for any future questions.

## 2013-09-27 NOTE — ED Notes (Signed)
Pt was eating lunch and had a syncopal episode , pt is on dialysis and has had some diarrhea but no vomiting , pt also states that his head and neck hurts and some general stomach pain 10 out 10

## 2013-09-28 NOTE — ED Provider Notes (Signed)
Medical screening examination/treatment/procedure(s) were conducted as a shared visit with non-physician practitioner(s) or resident and myself. I personally evaluated the patient during the encounter and agree with the findings and plan unless otherwise indicated.  I have personally reviewed any xrays and/ or EKG's with the provider and I agree with interpretation.  Patient with HIV, hep C, end-stage renal disease, orthostatic hypotension, malnutrition presents with gradual and set syncope and mild diarrhea. Patient has had this in the past and worse with standing up. Patient denies chest pain, shortness of breath or bleeding. On exam patient is mild diabetes membranes, malnourished/cachectic, heart regular rate and rhythm, lungs clear to auscultation, normal gait, cranial nerves grossly intact. Patient given IV and oral fluids and vitals and clinically patient feels improved. Initial plan for observation as orthostatic vitals closed his blood pressure dropped to 60s. Case manager/nurse has helped arrange close followup outpatient and I discussed with translation either observation ER versus close followup outpatient patient prefers to go home and stay hydrated by mouth. I think this is reasonable at this time since he has had this in the past. \  Orthostatic hypotension, syncope, end-stage renal disease   Mariea Clonts, MD 09/28/13 [phone]

## 2013-10-01 ENCOUNTER — Ambulatory Visit: Payer: Self-pay

## 2013-10-15 ENCOUNTER — Ambulatory Visit: Payer: Self-pay

## 2013-10-22 ENCOUNTER — Ambulatory Visit: Payer: Self-pay

## 2013-10-25 ENCOUNTER — Encounter: Payer: Self-pay | Admitting: Internal Medicine

## 2013-10-25 ENCOUNTER — Ambulatory Visit (INDEPENDENT_AMBULATORY_CARE_PROVIDER_SITE_OTHER): Payer: Self-pay | Admitting: Infectious Diseases

## 2013-10-25 ENCOUNTER — Encounter: Payer: Self-pay | Admitting: Infectious Diseases

## 2013-10-25 ENCOUNTER — Ambulatory Visit: Payer: Self-pay | Attending: Internal Medicine | Admitting: Internal Medicine

## 2013-10-25 ENCOUNTER — Encounter (INDEPENDENT_AMBULATORY_CARE_PROVIDER_SITE_OTHER): Payer: Self-pay | Admitting: Home Health Services

## 2013-10-25 VITALS — BP 102/66 | HR 93 | Temp 97.0°F | Wt 113.0 lb

## 2013-10-25 VITALS — BP 104/71 | HR 92 | Temp 97.6°F | Resp 16 | Wt 117.2 lb

## 2013-10-25 DIAGNOSIS — N186 End stage renal disease: Secondary | ICD-10-CM | POA: Insufficient documentation

## 2013-10-25 DIAGNOSIS — B1920 Unspecified viral hepatitis C without hepatic coma: Secondary | ICD-10-CM | POA: Insufficient documentation

## 2013-10-25 DIAGNOSIS — B20 Human immunodeficiency virus [HIV] disease: Secondary | ICD-10-CM

## 2013-10-25 DIAGNOSIS — Z21 Asymptomatic human immunodeficiency virus [HIV] infection status: Secondary | ICD-10-CM | POA: Insufficient documentation

## 2013-10-25 DIAGNOSIS — H539 Unspecified visual disturbance: Secondary | ICD-10-CM

## 2013-10-25 DIAGNOSIS — S81809A Unspecified open wound, unspecified lower leg, initial encounter: Secondary | ICD-10-CM

## 2013-10-25 DIAGNOSIS — Z0000 Encounter for general adult medical examination without abnormal findings: Secondary | ICD-10-CM | POA: Insufficient documentation

## 2013-10-25 DIAGNOSIS — Z139 Encounter for screening, unspecified: Secondary | ICD-10-CM

## 2013-10-25 DIAGNOSIS — E1129 Type 2 diabetes mellitus with other diabetic kidney complication: Secondary | ICD-10-CM

## 2013-10-25 DIAGNOSIS — E1165 Type 2 diabetes mellitus with hyperglycemia: Secondary | ICD-10-CM

## 2013-10-25 DIAGNOSIS — Z992 Dependence on renal dialysis: Secondary | ICD-10-CM

## 2013-10-25 DIAGNOSIS — K089 Disorder of teeth and supporting structures, unspecified: Secondary | ICD-10-CM

## 2013-10-25 DIAGNOSIS — E119 Type 2 diabetes mellitus without complications: Secondary | ICD-10-CM | POA: Insufficient documentation

## 2013-10-25 DIAGNOSIS — S91009A Unspecified open wound, unspecified ankle, initial encounter: Secondary | ICD-10-CM

## 2013-10-25 DIAGNOSIS — S81009A Unspecified open wound, unspecified knee, initial encounter: Secondary | ICD-10-CM

## 2013-10-25 LAB — LIPID PANEL
CHOL/HDL RATIO: 2.4 ratio
CHOLESTEROL: 90 mg/dL (ref 0–200)
HDL: 38 mg/dL — ABNORMAL LOW (ref >39)
LDL Cholesterol: 30 mg/dL (ref 0–99)
TRIGLYCERIDES: 110 mg/dL (ref <150)
VLDL: 22 mg/dL (ref 0–40)

## 2013-10-25 LAB — POCT GLYCOSYLATED HEMOGLOBIN (HGB A1C): Hemoglobin A1C: 5.7

## 2013-10-25 LAB — TSH: TSH: 2.021 u[IU]/mL (ref 0.350–4.500)

## 2013-10-25 LAB — GLUCOSE, POCT (MANUAL RESULT ENTRY): POC GLUCOSE: 119 mg/dL — AB (ref 70–99)

## 2013-10-25 MED ORDER — INSULIN LISPRO PROT & LISPRO (75-25 MIX) 100 UNIT/ML ~~LOC~~ SUSP: 15.0000 [IU] | Freq: Two times a day (BID) | SUBCUTANEOUS | Status: DC

## 2013-10-25 NOTE — Assessment & Plan Note (Signed)
He is on waiting list for dental. Will see if we can expedite.

## 2013-10-25 NOTE — Progress Notes (Signed)
   Subjective:    Patient ID: Alvin Daniels, male    DOB: 05-10-1971, 43 y.o.   MRN: [id number]  HPI 43 yo M with DM, ESRD, and HIV+. Was in hospital 12-4 to 04-22-13 due to repeat episode of hypotension, falls. He was started on midodrine. Was previously on DRVr/TFV/EMT (has been off since April). States they have not come to him. He has no phone. His cousin is his PCP.  Today complains of eye pain (was attempted to refer to Ophtho last visit).   He states he had this is unchanged since previous visit.  He has a large wound on his RLE where he had a boil this burst, does not hurt today.  No problems with FSG- 100- 90.   HIV 1 RNA Quant (copies/mL)  Date Value  07/26/2013 <20   04/15/2013 1705*  06/17/2012 4332*     CD4 T Cell Abs (/uL)  Date Value  04/15/2013 260*  06/17/2012 300*  01/11/2011 390*     Review of Systems  Gastrointestinal: Negative for diarrhea and constipation.  no problems with HD.      Objective:   Physical Exam  Constitutional: He appears well-developed and well-nourished.  HENT:  Mouth/Throat: No oropharyngeal exudate.  Poor dentition  Eyes: EOM are normal. Pupils are equal, round, and reactive to light.  Neck: Neck supple.  Cardiovascular: Normal rate, regular rhythm and normal heart sounds.   Pulmonary/Chest: Effort normal and breath sounds normal.  Abdominal: Soft. Bowel sounds are normal. He exhibits no distension. There is no tenderness.  Musculoskeletal:       Arms:      Legs: Lymphadenopathy:    He has no cervical adenopathy.          Assessment & Plan:

## 2013-10-25 NOTE — Assessment & Plan Note (Addendum)
[facility] working on getting him his ART from [facility]. Will see him back in 1-2 months.

## 2013-10-25 NOTE — Assessment & Plan Note (Signed)
Will try to have him seen by [facility].

## 2013-10-25 NOTE — Assessment & Plan Note (Signed)
Appreciate HD f/u.

## 2013-10-25 NOTE — Progress Notes (Signed)
Patient here to establish care Takes medication for DM and HTN Currently on dialysis three times a week Shunt is located in left arm

## 2013-10-25 NOTE — Assessment & Plan Note (Signed)
Will continue to work on getting him into ophtho.

## 2013-10-25 NOTE — Assessment & Plan Note (Signed)
Will refer him to community wellness.

## 2013-10-25 NOTE — Progress Notes (Signed)
error 

## 2013-10-25 NOTE — Patient Instructions (Signed)

## 2013-10-25 NOTE — Progress Notes (Signed)
Patient Demographics  Alvin Daniels, is a 43 y.o. male  [id number]  [id number]  DOB - December 19, 1970  CC:  Chief Complaint  Patient presents with  . Establish Care       HPI: Alvin Daniels is a 43 y.o. male here today to establish medical care.he has history of diabetes , ESRD on hemodialysis ,HIV, hep C, , patient follows up with infectious disease and has been referred to the clinic for management of his diabetes. As per patient he takes Humalog mix 15 units in the morning and 25 units at night patient reports some hypoglycemic episodes, today his hemoglobin A1c is 5.7% which has trended down from before, he gets dialysis done on every Tuesday Thursday and Saturdays and his blood work is done there, as per patient he used to be on blood pressure medication in the past but not anymore since his blood pressure is usually in the good range. Patient has No headache, No chest pain, No abdominal pain - No Nausea, No new weakness tingling or numbness, No Cough - SOB.  No Known Allergies Past Medical History  Diagnosis Date  . ESRD (end stage renal disease) on dialysis   . Hepatitis C   . Dialysis patient   . MRSA infection   . Pancreatitis   . Chronic diarrhea   . HIV INFECTION 06/27/2010    Qualifier: Diagnosis of  By: Nickola Major CMA ( [place]), Geni Bers    . Hypotension 06/02/2012  . Metabolic bone disease [id number]  . Normocytic anemia 06/17/2012  . Severe protein-calorie malnutrition 06/17/2012  . DM 06/27/2010    Annotation: uncontrolled Qualifier: Diagnosis of  By: Nickola Major CMA ( [facility]), Geni Bers    . [id number])    Current Outpatient Prescriptions on File Prior to Visit  Medication Sig Dispense Refill  . darunavir (PREZISTA) 400 MG tablet Take 2 tablets (800 mg total) by mouth daily with breakfast.  60 tablet  5  . emtricitabine (EMTRIVA) 200 MG capsule One capsule by mouth every Tuesday, and Saturday following dialysis.  10 capsule  5  . insulin  lispro protamine-lispro (HUMALOG MIX 75/25) (75-25) 100 UNIT/ML SUSP injection Inject into the skin. 15 units in the am and 25 units in the pm      . omeprazole (PRILOSEC) 40 MG capsule Take 1 capsule (40 mg total) by mouth daily.  90 capsule  3  . pantoprazole (PROTONIX) 40 MG tablet Take 40 mg by mouth daily.      . ritonavir (NORVIR) 100 MG TABS tablet Take 1 tablet (100 mg total) by mouth daily with breakfast. must take with Prezista.  30 tablet  5  . tenofovir (VIREAD) 300 MG tablet One tablet by mouth weekly every Sunday.  5 tablet  5   No current facility-administered medications on file prior to visit.   Family History  Problem Relation Age of Onset  . Diabetes Mother   . Diabetes Father    History   Social History  . Marital Status: Single    Spouse Name: N/A    Number of Children: N/A  . Years of Education: N/A   Occupational History  . Not on file.   Social History Main Topics  . Smoking status: Never Smoker   . Smokeless tobacco: Never Used  . Alcohol Use: No  . Drug Use: No  . Sexual Activity: Not Currently   Other Topics Concern  . Not on file   Social History Narrative  . No narrative on file  Review of Systems: Constitutional: Negative for fever, chills, diaphoresis, activity change, appetite change and fatigue. HENT: Negative for ear pain, nosebleeds, congestion, facial swelling, rhinorrhea, neck pain, neck stiffness and ear discharge.  Eyes: Negative for pain, discharge, redness, itching and visual disturbance. Respiratory: Negative for cough, choking, chest tightness, shortness of breath, wheezing and stridor.  Cardiovascular: Negative for chest pain, palpitations and leg swelling. Gastrointestinal: Negative for abdominal distention. Genitourinary: Negative for dysuria, urgency, frequency, hematuria, flank pain, decreased urine volume, difficulty urinating and dyspareunia.  Musculoskeletal: Negative for back pain, joint swelling, arthralgia and gait  problem. Neurological: Negative for dizziness, tremors, seizures, syncope, facial asymmetry, speech difficulty, weakness, light-headedness, numbness and headaches.  Hematological: Negative for adenopathy. Does not bruise/bleed easily. Psychiatric/Behavioral: Negative for hallucinations, behavioral problems, confusion, dysphoric mood, decreased concentration and agitation.    Objective:   Filed Vitals:   10/25/13 1212  BP: 104/71  Pulse: 92  Temp: 97.6 F (36.4 C)  Resp: 16    Physical Exam: Constitutional: Patient appears well-developed and well-nourished. No distress. HENT: Normocephalic, atraumatic, External right and left ear normal. Oropharynx is clear and moist.  Eyes: Conjunctivae and EOM are normal. PERRLA, no scleral icterus. Neck: Normal ROM. Neck supple. No JVD. No tracheal deviation. No thyromegaly. CVS: RRR, S1/S2 +, no murmurs, no gallops, no carotid bruit.  Pulmonary: Effort and breath sounds normal, no stridor, rhonchi, wheezes, rales.  Abdominal: Soft. BS +, no distension, tenderness, rebound or guarding.  Musculoskeletal: Normal range of motion. No edema and no tenderness. Left arm fistula  Neuro: Alert. Normal reflexes, muscle tone coordination. No cranial nerve deficit. Skin: Right leg wound. Psychiatric: Normal mood and affect. Behavior, judgment, thought content normal.  Lab Results  Component Value Date   WBC 9.3 09/27/2013   HGB 13.3 09/27/2013   HCT 38.9* 09/27/2013   MCV 96.8 09/27/2013   PLT 246 09/27/2013   Lab Results  Component Value Date   CREATININE 10.36* 09/27/2013   BUN 34* 09/27/2013   NA 139 09/27/2013   K 3.9 09/27/2013   CL 102 09/27/2013   CO2 18* 09/27/2013    Lab Results  Component Value Date   HGBA1C 5.7 10/25/2013   Lipid Panel     Component Value Date/Time   CHOL 87 04/15/2013 1734   TRIG 101 04/15/2013 1734   HDL 37* 04/15/2013 1734   CHOLHDL 2.4 04/15/2013 1734   VLDL 20 04/15/2013 1734   LDLCALC 30 04/15/2013 1734         Assessment and plan:   1. DM (diabetes mellitus) Results for orders placed in visit on 10/25/13  GLUCOSE, POCT (MANUAL RESULT ENTRY)      Result Value Ref Range   POC Glucose 119 (*) 70 - 99 mg/dl  POCT GLYCOSYLATED HEMOGLOBIN (HGB A1C)      Result Value Ref Range   Hemoglobin A1C 5.7     Diabetes too tightly controlled, he reported hypoglycemic episodes, I have reduced the dose of insulin, patient is advised to take 15 units in the morning and 15 units in the evening, continue to monitor fingerstick glucose at home, advise for diabetes meal planning. Patient has already been referred to ophthalmology.  2. ESRD on dialysis Patient is getting dialysis every Tuesday Thursday and Saturdays.  3. Screening Ordered fasting blood work. - TSH - Vit D  25 hydroxy (rtn osteoporosis monitoring) - Lipid panel  4. HIV INFECTION Following up with her ID.     Return in about 3 months (around 01/25/2014) for  diabetes.    Lorayne Marek, MD

## 2013-10-26 ENCOUNTER — Telehealth: Payer: Self-pay

## 2013-10-26 LAB — VITAMIN D 25 HYDROXY (VIT D DEFICIENCY, FRACTURES): Vit D, 25-Hydroxy: 26 ng/mL — ABNORMAL LOW (ref 30–89)

## 2013-10-26 MED ORDER — VITAMIN D (ERGOCALCIFEROL) 1.25 MG (50000 UNIT) PO CAPS: 50000.0000 [IU] | ORAL_CAPSULE | ORAL | Status: DC

## 2013-10-26 NOTE — Addendum Note (Signed)
Addended by: Dorothe Pea on: 10/26/2013 12:46 PM   Modules accepted: Orders

## 2013-10-26 NOTE — Telephone Encounter (Signed)
Message copied by Dorothe Pea on Tue Oct 26, 2013 12:39 PM ------      Message from: Lorayne Marek      Created: Tue Oct 26, 2013  9:37 AM       Blood work reviewed, noticed low vitamin D, call patient advise to start ergocalciferol 50,000 units once a week for the duration of  12 weeks.       ------

## 2013-10-26 NOTE — Telephone Encounter (Signed)
Interpreter line used -patient not available Message left on voice mail to return our call

## 2013-11-08 ENCOUNTER — Telehealth: Payer: Self-pay | Admitting: *Deleted

## 2013-11-08 NOTE — Telephone Encounter (Signed)
Notified patient's cousin, Claudia Desanctis, of appt with Dr. Katy Fitch for 11/10/13 at 8:00 AM. Given address [address] and he will try to accompany patient and act as interpreter.

## 2013-11-11 ENCOUNTER — Telehealth: Payer: Self-pay | Admitting: *Deleted

## 2013-11-11 NOTE — Telephone Encounter (Signed)
Received fax from Dr. Katy Fitch on eye exam stating patient has very dense nuclear cataracts bilaterally and needs surgery. His office is looking into what services he may be eligible for; such as services for the blind. If nothing can be arranged he will be seen again in six months. Myrtis Hopping

## 2013-11-17 ENCOUNTER — Encounter (HOSPITAL_BASED_OUTPATIENT_CLINIC_OR_DEPARTMENT_OTHER): Payer: Medicaid Other | Attending: General Surgery

## 2013-12-29 ENCOUNTER — Ambulatory Visit: Payer: Self-pay | Admitting: Infectious Diseases

## 2014-01-26 ENCOUNTER — Ambulatory Visit: Payer: Self-pay | Admitting: Internal Medicine

## 2014-01-27 ENCOUNTER — Other Ambulatory Visit: Payer: Self-pay | Admitting: *Deleted

## 2014-01-27 DIAGNOSIS — B20 Human immunodeficiency virus [HIV] disease: Secondary | ICD-10-CM

## 2014-01-27 MED ORDER — DARUNAVIR ETHANOLATE 400 MG PO TABS: 800.0000 mg | ORAL_TABLET | Freq: Every day | ORAL | Status: DC

## 2014-01-27 MED ORDER — TENOFOVIR DISOPROXIL FUMARATE 300 MG PO TABS: ORAL_TABLET | ORAL | Status: DC

## 2014-01-27 MED ORDER — RITONAVIR 100 MG PO TABS: 100.0000 mg | ORAL_TABLET | Freq: Every day | ORAL | Status: DC

## 2014-01-27 MED ORDER — EMTRICITABINE 200 MG PO CAPS: ORAL_CAPSULE | ORAL | Status: DC

## 2014-01-27 NOTE — Progress Notes (Signed)
ADAP application via [facility]

## 2014-02-21 ENCOUNTER — Ambulatory Visit: Payer: Self-pay | Admitting: Infectious Diseases

## 2014-02-23 ENCOUNTER — Encounter: Payer: Self-pay | Admitting: Infectious Diseases

## 2014-02-23 ENCOUNTER — Ambulatory Visit (INDEPENDENT_AMBULATORY_CARE_PROVIDER_SITE_OTHER): Payer: Self-pay | Admitting: Infectious Diseases

## 2014-02-23 VITALS — BP 120/80 | HR 93 | Temp 97.7°F | Ht 62.0 in | Wt 113.0 lb

## 2014-02-23 DIAGNOSIS — N186 End stage renal disease: Secondary | ICD-10-CM

## 2014-02-23 DIAGNOSIS — R197 Diarrhea, unspecified: Secondary | ICD-10-CM

## 2014-02-23 DIAGNOSIS — Z2252 Carrier of viral hepatitis C: Secondary | ICD-10-CM

## 2014-02-23 DIAGNOSIS — B182 Chronic viral hepatitis C: Secondary | ICD-10-CM

## 2014-02-23 DIAGNOSIS — K529 Noninfective gastroenteritis and colitis, unspecified: Secondary | ICD-10-CM

## 2014-02-23 DIAGNOSIS — B20 Human immunodeficiency virus [HIV] disease: Secondary | ICD-10-CM

## 2014-02-23 DIAGNOSIS — Z113 Encounter for screening for infections with a predominantly sexual mode of transmission: Secondary | ICD-10-CM

## 2014-02-23 LAB — COMPREHENSIVE METABOLIC PANEL
ALBUMIN: 3.6 g/dL (ref 3.5–5.2)
ALT: 83 U/L — ABNORMAL HIGH (ref 0–53)
AST: 70 U/L — ABNORMAL HIGH (ref 0–37)
Alkaline Phosphatase: 124 U/L — ABNORMAL HIGH (ref 39–117)
BUN: 27 mg/dL — ABNORMAL HIGH (ref 6–23)
CALCIUM: 9.3 mg/dL (ref 8.4–10.5)
CO2: 28 meq/L (ref 19–32)
Chloride: 100 mEq/L (ref 96–112)
Creat: 7.31 mg/dL — ABNORMAL HIGH (ref 0.50–1.35)
GLUCOSE: 72 mg/dL (ref 70–99)
POTASSIUM: 4.7 meq/L (ref 3.5–5.3)
Sodium: 138 mEq/L (ref 135–145)
Total Bilirubin: 0.5 mg/dL (ref 0.2–1.2)
Total Protein: 8 g/dL (ref 6.0–8.3)

## 2014-02-23 LAB — CBC
HEMATOCRIT: 33.7 % — AB (ref 39.0–52.0)
HEMOGLOBIN: 11.2 g/dL — AB (ref 13.0–17.0)
MCH: 30.9 pg (ref 26.0–34.0)
MCHC: 33.2 g/dL (ref 30.0–36.0)
MCV: 92.8 fL (ref 78.0–100.0)
Platelets: 188 10*3/uL (ref 150–400)
RBC: 3.63 MIL/uL — ABNORMAL LOW (ref 4.22–5.81)
RDW: 12.6 % (ref 11.5–15.5)
WBC: 4.7 10*3/uL (ref 4.0–10.5)

## 2014-02-23 LAB — IRON: Iron: 190 ug/dL — ABNORMAL HIGH (ref 42–165)

## 2014-02-23 LAB — PROTIME-INR
INR: 1.06 (ref <1.50)
Prothrombin Time: 13.8 seconds (ref 11.6–15.2)

## 2014-02-23 MED ORDER — LOPERAMIDE HCL 2 MG PO TABS
2.0000 mg | ORAL_TABLET | Freq: Four times a day (QID) | ORAL | Status: DC | PRN
Start: 2014-02-23 — End: 2015-02-17

## 2014-02-23 NOTE — Addendum Note (Signed)
Addended by: Shantika Bermea C on: 02/23/2014 11:17 AM   Modules accepted: Orders

## 2014-02-23 NOTE — Assessment & Plan Note (Signed)
Will give him trial of imodium.

## 2014-02-23 NOTE — Assessment & Plan Note (Signed)
He got flu shot at HD. He is offered/refuses condoms. Will continue his current medications.  Check his labs today. He is doing very well.  rtc 6 months.

## 2014-02-23 NOTE — Assessment & Plan Note (Signed)
Doing well, greatly appreciate renal f/u.

## 2014-02-23 NOTE — Progress Notes (Signed)
   Subjective:    Patient ID: Alvin Daniels, male    DOB: 03/01/1971, 43 y.o.   MRN: [id number]  HPI 43 yo M with DM, ESRD, and HIV+. Was in hospital 12-4 to 04-22-13-14 due to repeat episode of hypotension, falls. He was started on midodrine. Was previously on DRVr/TFV/EMT.   Previously complained of eye pain (was attempted to refer to Ophtho last visit).  Today he states his vision is good.  His orange card has expired.   He had a large wound on his RLE where he had a boil this burst, this has healed and scabbed over.  Has been having cramping in his legs. Has abd cramping on his post HD days. Has been having feelings that his stomach does not settle after eats. Has been having diarrhea as well.   No problems with his ART.  FSG has been 80-90 at home. Same at HD.   HIV 1 RNA Quant (copies/mL)  Date Value  07/26/2013 <20   04/15/2013 1705*  06/17/2012 4332*     CD4 T Cell Abs (/uL)  Date Value  04/15/2013 260*  06/17/2012 300*  01/11/2011 390*      Review of Systems  Constitutional: Negative for fever, chills and unexpected weight change.  see HPI.      Objective:   Physical Exam  Constitutional: He appears well-developed and well-nourished.  HENT:  Mouth/Throat: No oropharyngeal exudate.  Eyes: EOM are normal. Pupils are equal, round, and reactive to light.  Neck: Neck supple.  Cardiovascular: Normal rate and normal heart sounds.   Pulmonary/Chest: Effort normal and breath sounds normal.  Abdominal: Soft. Bowel sounds are normal. He exhibits no distension. There is no tenderness.  Musculoskeletal:  LUE fistula is clean, +bruit.   Lymphadenopathy:    He has no cervical adenopathy.          Assessment & Plan:

## 2014-02-23 NOTE — Assessment & Plan Note (Addendum)
check his Hep C screening labs to stage him for treatment. Check elastogrogaphy if Hep C RNA+

## 2014-02-24 LAB — HIV-1 RNA QUANT-NO REFLEX-BLD
HIV 1 RNA Quant: 59 copies/mL — ABNORMAL HIGH (ref <20)
HIV-1 RNA Quant, Log: 1.77 {Log} — ABNORMAL HIGH (ref <1.30)

## 2014-02-24 LAB — HEPATITIS C RNA QUANTITATIVE
HCV QUANT: 688334 [IU]/mL — AB (ref <15)
HCV Quantitative Log: 5.84 {Log} — ABNORMAL HIGH (ref <1.18)

## 2014-02-24 LAB — ANA: ANA: NEGATIVE

## 2014-02-24 LAB — RPR

## 2014-02-24 LAB — T-HELPER CELL (CD4) - (RCID CLINIC ONLY)
CD4 % Helper T Cell: 15 % — ABNORMAL LOW (ref 33–55)
CD4 T CELL ABS: 260 /uL — AB (ref 400–2700)

## 2014-03-02 LAB — HEPATITIS C GENOTYPE

## 2014-04-18 ENCOUNTER — Inpatient Hospital Stay (HOSPITAL_COMMUNITY)
Admission: EM | Admit: 2014-04-18 | Discharge: 2014-04-24 | DRG: 616 | Disposition: A | Discharge status: Home / Self Care | Payer: Medicaid Other | Attending: Family Medicine | Admitting: Family Medicine

## 2014-04-18 ENCOUNTER — Encounter (HOSPITAL_COMMUNITY): Payer: Self-pay | Admitting: Emergency Medicine

## 2014-04-18 ENCOUNTER — Inpatient Hospital Stay (HOSPITAL_COMMUNITY): Payer: Medicaid Other

## 2014-04-18 DIAGNOSIS — E1152 Type 2 diabetes mellitus with diabetic peripheral angiopathy with gangrene: Secondary | ICD-10-CM | POA: Diagnosis present

## 2014-04-18 DIAGNOSIS — L089 Local infection of the skin and subcutaneous tissue, unspecified: Secondary | ICD-10-CM

## 2014-04-18 DIAGNOSIS — B20 Human immunodeficiency virus [HIV] disease: Secondary | ICD-10-CM | POA: Insufficient documentation

## 2014-04-18 DIAGNOSIS — B999 Unspecified infectious disease: Secondary | ICD-10-CM

## 2014-04-18 DIAGNOSIS — E871 Hypo-osmolality and hyponatremia: Secondary | ICD-10-CM | POA: Diagnosis not present

## 2014-04-18 DIAGNOSIS — I472 Ventricular tachycardia: Secondary | ICD-10-CM | POA: Diagnosis present

## 2014-04-18 DIAGNOSIS — Z794 Long term (current) use of insulin: Secondary | ICD-10-CM

## 2014-04-18 DIAGNOSIS — B1920 Unspecified viral hepatitis C without hepatic coma: Secondary | ICD-10-CM

## 2014-04-18 DIAGNOSIS — E1169 Type 2 diabetes mellitus with other specified complication: Principal | ICD-10-CM

## 2014-04-18 DIAGNOSIS — M869 Osteomyelitis, unspecified: Secondary | ICD-10-CM | POA: Diagnosis present

## 2014-04-18 DIAGNOSIS — I9589 Other hypotension: Secondary | ICD-10-CM | POA: Diagnosis present

## 2014-04-18 DIAGNOSIS — I120 Hypertensive chronic kidney disease with stage 5 chronic kidney disease or end stage renal disease: Secondary | ICD-10-CM | POA: Diagnosis present

## 2014-04-18 DIAGNOSIS — N186 End stage renal disease: Secondary | ICD-10-CM | POA: Diagnosis present

## 2014-04-18 DIAGNOSIS — A419 Sepsis, unspecified organism: Secondary | ICD-10-CM | POA: Insufficient documentation

## 2014-04-18 DIAGNOSIS — B182 Chronic viral hepatitis C: Secondary | ICD-10-CM

## 2014-04-18 DIAGNOSIS — N2581 Secondary hyperparathyroidism of renal origin: Secondary | ICD-10-CM | POA: Diagnosis present

## 2014-04-18 DIAGNOSIS — Z992 Dependence on renal dialysis: Secondary | ICD-10-CM | POA: Diagnosis not present

## 2014-04-18 DIAGNOSIS — M545 Low back pain: Secondary | ICD-10-CM | POA: Diagnosis present

## 2014-04-18 DIAGNOSIS — D638 Anemia in other chronic diseases classified elsewhere: Secondary | ICD-10-CM | POA: Diagnosis present

## 2014-04-18 DIAGNOSIS — I739 Peripheral vascular disease, unspecified: Secondary | ICD-10-CM | POA: Diagnosis present

## 2014-04-18 DIAGNOSIS — E11628 Type 2 diabetes mellitus with other skin complications: Secondary | ICD-10-CM | POA: Diagnosis present

## 2014-04-18 HISTORY — DX: Disorder of kidney and ureter, unspecified: N28.9

## 2014-04-18 LAB — CREATININE, SERUM
Creatinine, Ser: 10.11 mg/dL — ABNORMAL HIGH (ref 0.50–1.35)
GFR calc non Af Amer: 6 mL/min — ABNORMAL LOW (ref >90)
GFR, EST AFRICAN AMERICAN: 6 mL/min — AB (ref >90)

## 2014-04-18 LAB — COMPREHENSIVE METABOLIC PANEL
ALT: 17 U/L (ref 0–53)
ANION GAP: 18 — AB (ref 5–15)
AST: 27 U/L (ref 0–37)
Albumin: 3 g/dL — ABNORMAL LOW (ref 3.5–5.2)
Alkaline Phosphatase: 77 U/L (ref 39–117)
BUN: 36 mg/dL — AB (ref 6–23)
CHLORIDE: 90 meq/L — AB (ref 96–112)
CO2: 24 mEq/L (ref 19–32)
Calcium: 9.1 mg/dL (ref 8.4–10.5)
Creatinine, Ser: 9.56 mg/dL — ABNORMAL HIGH (ref 0.50–1.35)
GFR calc Af Amer: 7 mL/min — ABNORMAL LOW (ref >90)
GFR, EST NON AFRICAN AMERICAN: 6 mL/min — AB (ref >90)
GLUCOSE: 158 mg/dL — AB (ref 70–99)
Potassium: 4.5 mEq/L (ref 3.7–5.3)
Sodium: 132 mEq/L — ABNORMAL LOW (ref 137–147)
Total Bilirubin: 0.5 mg/dL (ref 0.3–1.2)
Total Protein: 8.6 g/dL — ABNORMAL HIGH (ref 6.0–8.3)

## 2014-04-18 LAB — CBC WITH DIFFERENTIAL/PLATELET
Basophils Absolute: 0 10*3/uL (ref 0.0–0.1)
Basophils Relative: 0 % (ref 0–1)
Eosinophils Absolute: 0.1 10*3/uL (ref 0.0–0.7)
Eosinophils Relative: 1 % (ref 0–5)
HCT: 30 % — ABNORMAL LOW (ref 39.0–52.0)
HEMOGLOBIN: 10.2 g/dL — AB (ref 13.0–17.0)
Lymphocytes Relative: 11 % — ABNORMAL LOW (ref 12–46)
Lymphs Abs: 1.4 10*3/uL (ref 0.7–4.0)
MCH: 33.3 pg (ref 26.0–34.0)
MCHC: 34 g/dL (ref 30.0–36.0)
MCV: 98 fL (ref 78.0–100.0)
MONO ABS: 0.7 10*3/uL (ref 0.1–1.0)
MONOS PCT: 6 % (ref 3–12)
NEUTROS ABS: 10.7 10*3/uL — AB (ref 1.7–7.7)
Neutrophils Relative %: 82 % — ABNORMAL HIGH (ref 43–77)
Platelets: 207 10*3/uL (ref 150–400)
RBC: 3.06 MIL/uL — ABNORMAL LOW (ref 4.22–5.81)
RDW: 14.5 % (ref 11.5–15.5)
WBC: 12.9 10*3/uL — AB (ref 4.0–10.5)

## 2014-04-18 LAB — CBC
HEMATOCRIT: 28.8 % — AB (ref 39.0–52.0)
Hemoglobin: 9.6 g/dL — ABNORMAL LOW (ref 13.0–17.0)
MCH: 32 pg (ref 26.0–34.0)
MCHC: 33.3 g/dL (ref 30.0–36.0)
MCV: 96 fL (ref 78.0–100.0)
Platelets: 208 10*3/uL (ref 150–400)
RBC: 3 MIL/uL — AB (ref 4.22–5.81)
RDW: 14.4 % (ref 11.5–15.5)
WBC: 13.9 10*3/uL — ABNORMAL HIGH (ref 4.0–10.5)

## 2014-04-18 LAB — GLUCOSE, CAPILLARY
GLUCOSE-CAPILLARY: 98 mg/dL (ref 70–99)
GLUCOSE-CAPILLARY: 120 mg/dL — AB (ref 70–99)
Glucose-Capillary: 67 mg/dL — ABNORMAL LOW (ref 70–99)
Glucose-Capillary: 173 mg/dL — ABNORMAL HIGH (ref 70–99)

## 2014-04-18 LAB — LACTIC ACID, PLASMA: Lactic Acid, Venous: 0.7 mmol/L (ref 0.5–2.2)

## 2014-04-18 LAB — SEDIMENTATION RATE: Sed Rate: 104 mm/hr — ABNORMAL HIGH (ref 0–16)

## 2014-04-18 MED ORDER — DEXTROSE 5 % IV SOLN
2.0000 g | Freq: Once | INTRAVENOUS | Status: AC
  Administered 2014-04-18: 2 g via INTRAVENOUS
  Filled 2014-04-18: qty 2

## 2014-04-18 MED ORDER — DARUNAVIR ETHANOLATE 800 MG PO TABS
800.0000 mg | ORAL_TABLET | Freq: Every day | ORAL | Status: DC
  Administered 2014-04-19 – 2014-04-24 (×5): 800 mg via ORAL
  Filled 2014-04-18 (×7): qty 1

## 2014-04-18 MED ORDER — ACETAMINOPHEN 650 MG RE SUPP
650.0000 mg | Freq: Four times a day (QID) | RECTAL | Status: DC | PRN
  Administered 2014-04-18: 650 mg via RECTAL
  Filled 2014-04-18: qty 1

## 2014-04-18 MED ORDER — TENOFOVIR DISOPROXIL FUMARATE 300 MG PO TABS
300.0000 mg | ORAL_TABLET | ORAL | Status: DC
  Administered 2014-04-24: 300 mg via ORAL
  Filled 2014-04-18: qty 1

## 2014-04-18 MED ORDER — SODIUM CHLORIDE 0.9 % IJ SOLN: 3.0000 mL | Freq: Two times a day (BID) | INTRAMUSCULAR | Status: DC

## 2014-04-18 MED ORDER — VANCOMYCIN HCL 500 MG IV SOLR
500.0000 mg | INTRAVENOUS | Status: DC
  Administered 2014-04-19 – 2014-04-21 (×2): 500 mg via INTRAVENOUS
  Filled 2014-04-18 (×5): qty 500

## 2014-04-18 MED ORDER — SODIUM CHLORIDE 0.9 % IV BOLUS (SEPSIS)
500.0000 mL | Freq: Once | INTRAVENOUS | Status: AC
Start: 2014-04-18 — End: 2014-04-18
  Administered 2014-04-18: 500 mL via INTRAVENOUS

## 2014-04-18 MED ORDER — SODIUM CHLORIDE 0.9 % IV SOLN
250.0000 mL | INTRAVENOUS | Status: DC | PRN
  Administered 2014-04-20: 16:00:00 via INTRAVENOUS

## 2014-04-18 MED ORDER — DEXTROSE 5 % IV SOLN
2.0000 g | INTRAVENOUS | Status: DC
  Administered 2014-04-19 – 2014-04-21 (×2): 2 g via INTRAVENOUS
  Filled 2014-04-18 (×4): qty 2

## 2014-04-18 MED ORDER — METRONIDAZOLE IN NACL 5-0.79 MG/ML-% IV SOLN
500.0000 mg | Freq: Three times a day (TID) | INTRAVENOUS | Status: DC
  Administered 2014-04-18 – 2014-04-23 (×11): 500 mg via INTRAVENOUS
  Filled 2014-04-18 (×17): qty 100

## 2014-04-18 MED ORDER — RITONAVIR 100 MG PO TABS
100.0000 mg | ORAL_TABLET | Freq: Every day | ORAL | Status: DC
  Administered 2014-04-19 – 2014-04-24 (×5): 100 mg via ORAL
  Filled 2014-04-18 (×8): qty 1

## 2014-04-18 MED ORDER — SODIUM CHLORIDE 0.9 % IJ SOLN
3.0000 mL | Freq: Two times a day (BID) | INTRAMUSCULAR | Status: DC
  Administered 2014-04-19 – 2014-04-24 (×9): 3 mL via INTRAVENOUS

## 2014-04-18 MED ORDER — EMTRICITABINE 200 MG PO CAPS
200.0000 mg | ORAL_CAPSULE | ORAL | Status: DC
  Filled 2014-04-18: qty 1

## 2014-04-18 MED ORDER — SODIUM CHLORIDE 0.9 % IJ SOLN: 3.0000 mL | INTRAMUSCULAR | Status: DC | PRN

## 2014-04-18 MED ORDER — INSULIN ASPART 100 UNIT/ML ~~LOC~~ SOLN
0.0000 [IU] | Freq: Every day | SUBCUTANEOUS | Status: DC
  Administered 2014-04-21: 2 [IU] via SUBCUTANEOUS

## 2014-04-18 MED ORDER — MORPHINE SULFATE 2 MG/ML IJ SOLN: 1.0000 mg | INTRAMUSCULAR | Status: DC | PRN

## 2014-04-18 MED ORDER — HEPARIN SODIUM (PORCINE) 5000 UNIT/ML IJ SOLN
5000.0000 [IU] | Freq: Three times a day (TID) | INTRAMUSCULAR | Status: DC
  Administered 2014-04-18 – 2014-04-24 (×15): 5000 [IU] via SUBCUTANEOUS
  Filled 2014-04-18 (×20): qty 1

## 2014-04-18 MED ORDER — MORPHINE SULFATE 4 MG/ML IJ SOLN
4.0000 mg | Freq: Once | INTRAMUSCULAR | Status: AC
Start: 2014-04-18 — End: 2014-04-18
  Administered 2014-04-18: 4 mg via INTRAVENOUS
  Filled 2014-04-18: qty 1

## 2014-04-18 MED ORDER — INSULIN ASPART 100 UNIT/ML ~~LOC~~ SOLN
0.0000 [IU] | Freq: Three times a day (TID) | SUBCUTANEOUS | Status: DC
  Administered 2014-04-19: 3 [IU] via SUBCUTANEOUS
  Administered 2014-04-19: 1 [IU] via SUBCUTANEOUS
  Administered 2014-04-20 – 2014-04-22 (×3): 2 [IU] via SUBCUTANEOUS
  Administered 2014-04-22: 1 [IU] via SUBCUTANEOUS
  Administered 2014-04-22 – 2014-04-24 (×5): 2 [IU] via SUBCUTANEOUS

## 2014-04-18 MED ORDER — VANCOMYCIN HCL IN DEXTROSE 1-5 GM/200ML-% IV SOLN
1000.0000 mg | INTRAVENOUS | Status: AC
  Administered 2014-04-18: 1000 mg via INTRAVENOUS
  Filled 2014-04-18: qty 200

## 2014-04-18 MED ORDER — SODIUM CHLORIDE 0.9 % IV BOLUS (SEPSIS)
500.0000 mL | Freq: Once | INTRAVENOUS | Status: AC
  Administered 2014-04-18: 500 mL via INTRAVENOUS

## 2014-04-18 MED ORDER — PANTOPRAZOLE SODIUM 40 MG PO TBEC
40.0000 mg | DELAYED_RELEASE_TABLET | Freq: Every day | ORAL | Status: DC
  Administered 2014-04-18 – 2014-04-24 (×7): 40 mg via ORAL
  Filled 2014-04-18 (×7): qty 1

## 2014-04-18 MED ORDER — EMTRICITABINE 200 MG PO CAPS: 200.0000 mg | ORAL_CAPSULE | ORAL | Status: DC

## 2014-04-18 MED ORDER — PIPERACILLIN-TAZOBACTAM IN DEX 2-0.25 GM/50ML IV SOLN
2.2500 g | Freq: Three times a day (TID) | INTRAVENOUS | Status: DC
  Administered 2014-04-18: 2.25 g via INTRAVENOUS
  Filled 2014-04-18 (×3): qty 50

## 2014-04-18 NOTE — Progress Notes (Addendum)
Family Practice Teaching Service Interval Progress Note  Patient transferred to step down. Has gotten about 750 cc of his bolus and blood pressure improved to 88/44. Patient continues to [facility] well. I discussed what we were doing with the patient through [facility] interpret[facility]. He noted understanding and had no questions. Will continue to monitor.   Tommi Rumps, MD Family Medicine PGY-3 Service Pager [phone]

## 2014-04-18 NOTE — ED Notes (Addendum)
Per EMS, pt called out for left foot pain and back pain. Pt has blackening to the 4th toe on the left foot that he first noticed yesterday. NAD at this time. Pt alert x 4.

## 2014-04-18 NOTE — ED Provider Notes (Signed)
CSN: [id number]     Arrival date & time 04/18/14  1159 History   First MD Initiated Contact with Patient 04/18/14 1207     Chief Complaint  Patient presents with  . Foot Pain  . Back Pain   Level V caveat due to language barrier. Translator films use but appeared to still be some difficulty  (Consider location/radiation/quality/duration/timing/severity/associated sxs/prior Treatment) Patient is a 43 y.o. male presenting with lower extremity pain and back pain.  Foot Pain Pertinent negatives include no abdominal pain and no shortness of breath.  Back Pain Associated symptoms: no abdominal pain    patient presents with pain in his left fourth toe. There is been drainage. States it is only been hurting for the last couple days. He goes to dialysis on Tuesday Thursday Saturday. States he has had chills. His been some drainage. Also some dull lower back pain. No recent antibiotics. He is diabetic.  Past Medical History  Diagnosis Date  . Diabetes mellitus without complication   . Renal disorder    No past surgical history on file. No family history on file. History  Substance Use Topics  . Smoking status: Never Smoker   . Smokeless tobacco: Not on file  . Alcohol Use: No    Review of Systems  Unable to perform ROS Constitutional: Positive for chills. Negative for appetite change.  Respiratory: Negative for shortness of breath.   Gastrointestinal: Negative for abdominal pain.  Musculoskeletal: Positive for back pain.  Skin: Positive for color change and wound.      Allergies  Review of patient's allergies indicates no known allergies.  Home Medications   Prior to Admission medications   Medication Sig Start Date End Date Taking? Authorizing Provider  Darunavir Ethanolate (PREZISTA) 800 MG tablet Take 800 mg by mouth daily with breakfast.   Yes Historical Provider, MD  emtricitabine (EMTRIVA) 200 MG capsule Take 200 mg by mouth See admin instructions. *takes every Tuesday  and Saturday following dialysis*   Yes Historical Provider, MD  omeprazole (PRILOSEC) 40 MG capsule Take 40 mg by mouth daily.   Yes Historical Provider, MD  ritonavir (NORVIR) 100 MG TABS tablet Take 100 mg by mouth daily with breakfast.   Yes Historical Provider, MD  tenofovir (VIREAD) 300 MG tablet Take 300 mg by mouth every Sunday.   Yes Historical Provider, MD   BP 92/56 mmHg  Pulse 90  Temp(Src) 98.5 F (36.9 C) (Oral)  Resp 20  Wt 108 lb 0.4 oz (49 kg)  SpO2 100% Physical Exam  Constitutional: He appears well-developed.  HENT:  Head: Normocephalic.  Cardiovascular:  Mild tachycardia  Pulmonary/Chest: Effort normal.  Abdominal: Soft. There is no tenderness.  Musculoskeletal:  Left fourth toe is necrotic and draining. No sensation to toe. Dorsalis pedis pulse intact. Some erythema on the foot.  Neurological: He is alert.  Skin: Skin is warm.        ED Course  Procedures (including critical care time) Labs Review Labs Reviewed  CBC WITH DIFFERENTIAL - Abnormal; Notable for the following:    WBC 12.9 (*)    RBC 3.06 (*)    Hemoglobin 10.2 (*)    HCT 30.0 (*)    Neutrophils Relative % 82 (*)    Neutro Abs 10.7 (*)    Lymphocytes Relative 11 (*)    All other components within normal limits  COMPREHENSIVE METABOLIC PANEL - Abnormal; Notable for the following:    Sodium 132 (*)    Chloride 90 (*)  Glucose, Bld 158 (*)    BUN 36 (*)    Creatinine, Ser 9.56 (*)    Total Protein 8.6 (*)    Albumin 3.0 (*)    GFR calc non Af Amer 6 (*)    GFR calc Af Amer 7 (*)    Anion gap 18 (*)    All other components within normal limits  HEMOGLOBIN A1C - Abnormal; Notable for the following:    Hgb A1c MFr Bld 6.0 (*)    Mean Plasma Glucose 126 (*)    All other components within normal limits  SEDIMENTATION RATE - Abnormal; Notable for the following:    Sed Rate 104 (*)    All other components within normal limits  C-REACTIVE PROTEIN - Abnormal; Notable for the  following:    CRP 17.8 (*)    All other components within normal limits  CBC - Abnormal; Notable for the following:    WBC 13.9 (*)    RBC 3.00 (*)    Hemoglobin 9.6 (*)    HCT 28.8 (*)    All other components within normal limits  CREATININE, SERUM - Abnormal; Notable for the following:    Creatinine, Ser 10.11 (*)    GFR calc non Af Amer 6 (*)    GFR calc Af Amer 6 (*)    All other components within normal limits  BASIC METABOLIC PANEL - Abnormal; Notable for the following:    Sodium 133 (*)    Glucose, Bld 138 (*)    BUN 41 (*)    Creatinine, Ser 10.60 (*)    GFR calc non Af Amer 5 (*)    GFR calc Af Amer 6 (*)    Anion gap 17 (*)    All other components within normal limits  CBC - Abnormal; Notable for the following:    WBC 14.3 (*)    RBC 2.92 (*)    Hemoglobin 9.2 (*)    HCT 28.3 (*)    All other components within normal limits  GLUCOSE, CAPILLARY - Abnormal; Notable for the following:    Glucose-Capillary 67 (*)    All other components within normal limits  GLUCOSE, CAPILLARY - Abnormal; Notable for the following:    Glucose-Capillary 173 (*)    All other components within normal limits  GLUCOSE, CAPILLARY - Abnormal; Notable for the following:    Glucose-Capillary 120 (*)    All other components within normal limits  GLUCOSE, CAPILLARY - Abnormal; Notable for the following:    Glucose-Capillary 150 (*)    All other components within normal limits  GLUCOSE, CAPILLARY - Abnormal; Notable for the following:    Glucose-Capillary 244 (*)    All other components within normal limits  CULTURE, BLOOD (ROUTINE X 2)  CULTURE, BLOOD (ROUTINE X 2)  WOUND CULTURE  GLUCOSE, CAPILLARY  LACTIC ACID, PLASMA  LACTIC ACID, PLASMA  TROPONIN I  MAGNESIUM  PHOSPHORUS  TROPONIN I  TROPONIN I  HIV-1 RNA ULTRAQUANT REFLEX TO GENTYP+  T-HELPER CELLS (CD4) COUNT  CBC  RENAL FUNCTION PANEL  TYPE AND SCREEN  ABO/RH    Imaging Review Dg Foot Complete Left  04/18/2014    CLINICAL DATA:  Left fourth toe necrosis  EXAM: LEFT FOOT - COMPLETE 3+ VIEW  COMPARISON:  None.  FINDINGS: There is considerable air the soft tissues surrounding the fourth digit particularly adjacent to the proximal phalanx. There are changes suggestive of prior fracture with some impaction and bony resorption consistent with osteomyelitis. These changes  are most noted in the lateral aspect of the fourth proximal phalanx and middle phalanx. Mild vascular calcifications are noted. No other acute bony abnormality is seen.  IMPRESSION: Subcutaneous air in the fourth digit with changes of prior fracture of the fourth proximal phalanx with resorption in the fourth proximal and middle phalanges. These changes are consistent with osteomyelitis.   Electronically Signed   By: Inez Catalina M.D.   On: 04/18/2014 16:22     EKG Interpretation None      MDM   Final diagnoses:  Infection  Toe infection  End stage renal failure on dialysis    Patient with infection of his toe. Likely osteomyelitis. Will admit to internal medicine.    Alvin Daniels. Alvin Chapel, MD 04/19/14 1250

## 2014-04-18 NOTE — Consult Note (Addendum)
Reason for Consult: Left fourth toe osteomyelitis. Referring Physician: Meshulem Onorato is an 43 y.o. male.  HPI: Week of draining.  Past Medical History  Diagnosis Date  . Diabetes mellitus without complication   . Renal disorder     No past surgical history on file.  No family history on file.  Social History:  reports that he has never smoked. He does not have any smokeless tobacco history on file. He reports that he does not drink alcohol or use illicit drugs.  Allergies: No Known Allergies  Medications: I have reviewed the patient's current medications.  Results for orders placed or performed during the hospital encounter of 04/18/14 (from the past 48 hour(s))  CBC with Differential     Status: Abnormal   Collection Time: 04/18/14  1:00 PM  Result Value Ref Range   WBC 12.9 (H) 4.0 - 10.5 K/uL   RBC 3.06 (L) 4.22 - 5.81 MIL/uL   Hemoglobin 10.2 (L) 13.0 - 17.0 g/dL   HCT 30.0 (L) 39.0 - 52.0 %   MCV 98.0 78.0 - 100.0 fL   MCH 33.3 26.0 - 34.0 pg   MCHC 34.0 30.0 - 36.0 g/dL   RDW 14.5 11.5 - 15.5 %   Platelets 207 150 - 400 K/uL   Neutrophils Relative % 82 (H) 43 - 77 %   Neutro Abs 10.7 (H) 1.7 - 7.7 K/uL   Lymphocytes Relative 11 (L) 12 - 46 %   Lymphs Abs 1.4 0.7 - 4.0 K/uL   Monocytes Relative 6 3 - 12 %   Monocytes Absolute 0.7 0.1 - 1.0 K/uL   Eosinophils Relative 1 0 - 5 %   Eosinophils Absolute 0.1 0.0 - 0.7 K/uL   Basophils Relative 0 0 - 1 %   Basophils Absolute 0.0 0.0 - 0.1 K/uL  Comprehensive metabolic panel     Status: Abnormal   Collection Time: 04/18/14  1:00 PM  Result Value Ref Range   Sodium 132 (L) 137 - 147 mEq/L   Potassium 4.5 3.7 - 5.3 mEq/L   Chloride 90 (L) 96 - 112 mEq/L   CO2 24 19 - 32 mEq/L   Glucose, Bld 158 (H) 70 - 99 mg/dL   BUN 36 (H) 6 - 23 mg/dL   Creatinine, Ser 9.56 (H) 0.50 - 1.35 mg/dL   Calcium 9.1 8.4 - 10.5 mg/dL   Total Protein 8.6 (H) 6.0 - 8.3 g/dL   Albumin 3.0 (L) 3.5 - 5.2 g/dL   AST 27 0 -  37 U/L    Comment: HEMOLYSIS AT THIS LEVEL MAY AFFECT RESULT   ALT 17 0 - 53 U/L   Alkaline Phosphatase 77 39 - 117 U/L   Total Bilirubin 0.5 0.3 - 1.2 mg/dL   GFR calc non Af Amer 6 (L) >90 mL/min   GFR calc Af Amer 7 (L) >90 mL/min    Comment: (NOTE) The eGFR has been calculated using the CKD EPI equation. This calculation has not been validated in all clinical situations. eGFR's persistently <90 mL/min signify possible Chronic Kidney Disease.    Anion gap 18 (H) 5 - 15  Sedimentation rate     Status: Abnormal   Collection Time: 04/18/14  5:15 PM  Result Value Ref Range   Sed Rate 104 (H) 0 - 16 mm/hr  CBC     Status: Abnormal   Collection Time: 04/18/14  5:15 PM  Result Value Ref Range   WBC 13.9 (H) 4.0 - 10.5 K/uL  RBC 3.00 (L) 4.22 - 5.81 MIL/uL   Hemoglobin 9.6 (L) 13.0 - 17.0 g/dL   HCT 28.8 (L) 39.0 - 52.0 %   MCV 96.0 78.0 - 100.0 fL   MCH 32.0 26.0 - 34.0 pg   MCHC 33.3 30.0 - 36.0 g/dL   RDW 14.4 11.5 - 15.5 %   Platelets 208 150 - 400 K/uL  Creatinine, serum     Status: Abnormal   Collection Time: 04/18/14  5:15 PM  Result Value Ref Range   Creatinine, Ser 10.11 (H) 0.50 - 1.35 mg/dL   GFR calc non Af Amer 6 (L) >90 mL/min   GFR calc Af Amer 6 (L) >90 mL/min    Comment: (NOTE) The eGFR has been calculated using the CKD EPI equation. This calculation has not been validated in all clinical situations. eGFR's persistently <90 mL/min signify possible Chronic Kidney Disease.   Glucose, capillary     Status: Abnormal   Collection Time: 04/18/14  5:35 PM  Result Value Ref Range   Glucose-Capillary 67 (L) 70 - 99 mg/dL  Glucose, capillary     Status: None   Collection Time: 04/18/14  6:25 PM  Result Value Ref Range   Glucose-Capillary 98 70 - 99 mg/dL    Dg Foot Complete Left  04/18/2014   CLINICAL DATA:  Left fourth toe necrosis  EXAM: LEFT FOOT - COMPLETE 3+ VIEW  COMPARISON:  None.  FINDINGS: There is considerable air the soft tissues surrounding the  fourth digit particularly adjacent to the proximal phalanx. There are changes suggestive of prior fracture with some impaction and bony resorption consistent with osteomyelitis. These changes are most noted in the lateral aspect of the fourth proximal phalanx and middle phalanx. Mild vascular calcifications are noted. No other acute bony abnormality is seen.  IMPRESSION: Subcutaneous air in the fourth digit with changes of prior fracture of the fourth proximal phalanx with resorption in the fourth proximal and middle phalanges. These changes are consistent with osteomyelitis.   Electronically Signed   By: Inez Catalina M.D.   On: 04/18/2014 16:22    Review of Systems  Constitutional: Positive for fever.  Gastrointestinal: Positive for nausea.  Musculoskeletal: Positive for joint pain.  All other systems reviewed and are negative.  Blood pressure 72/35, pulse 88, temperature 101.5 F (38.6 C), temperature source Oral, resp. rate 17, weight 49 kg (108 lb 0.4 oz), SpO2 97 %. Physical Exam  Constitutional: He is oriented to person, place, and time. He appears well-developed.  HENT:  Head: Normocephalic.  Eyes: Pupils are equal, round, and reactive to light.  Neck: Normal range of motion.  Cardiovascular: Regular rhythm.   Respiratory: Effort normal.  GI: Soft.  Musculoskeletal:  Left fourth toe necrotic draining. Dorsal blister. Foot not swollen or with erythema. No sensation. Trace DP. Compartments soft.    Neurological: He is alert and oriented to person, place, and time.  Skin: Skin is warm and dry.  Psychiatric: He has a normal mood and affect.    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 I&D at bedside. Family unavailable. Mechanical debridement to decompress abscess cleaned with betadyne and wrapped. Blister unroofed.  Agree broad spectrum antibiotics. Flagyl not in. Will discuss with Dr. Sharol Given. Will require amputation. Vascular assessment  ABI's. Repeat BP. Discussed with Medicine. Transfering to the Unit for stabilization.    Deya Bigos C 04/18/2014, 8:17 PM   [phone]

## 2014-04-18 NOTE — Progress Notes (Addendum)
Family Practice Teaching Service Interval Progress Note  Patient with BP of 72/35. Nursing attempted to recheck manually and were unable to get the blood pressure. I checked manually and BP of 60/38. Patient mentating well and able to answer simple questions in [place]. Will bolus 1000 cc now. Will recheck BP following this bolus. Will transfer to step down. If BP does not respond to fluid will consult CCM for further recs.    Tommi Rumps, MD Family Medicine PGY-3 Service Pager [phone]

## 2014-04-18 NOTE — Progress Notes (Addendum)
ANTIBIOTIC CONSULT NOTE - INITIAL  Pharmacy Consult for Vancomycin and Zosyn Indication: wound infection  No Known Allergies  Patient Measurements: Weight: 108 lb 0.4 oz (49 kg)  Vital Signs: Temp: 99.6 F (37.6 C) (12/07 1205) Temp Source: Oral (12/07 1205) BP: 134/80 mmHg (12/07 1205) Pulse Rate: 106 (12/07 1205) Intake/Output from previous day:   Intake/Output from this shift:    Labs: No results for input(s): WBC, HGB, PLT, LABCREA, CREATININE in the last 72 hours. CrCl cannot be calculated (Unknown ideal weight.). No results for input(s): VANCOTROUGH, VANCOPEAK, VANCORANDOM, GENTTROUGH, GENTPEAK, GENTRANDOM, TOBRATROUGH, TOBRAPEAK, TOBRARND, AMIKACINPEAK, AMIKACINTROU, AMIKACIN in the last 72 hours.   Microbiology: No results found for this or any previous visit (from the past 720 hour(s)).  Medical History: Past Medical History  Diagnosis Date  . Diabetes mellitus without complication   . Renal disorder     Medications:  See electronic med rec  Assessment: 43 y.o. male presents with foot pain. Pt also noted some drainage. To begin broad spectrum antibiotics (Vanc and Zosyn) for foot infection. Pt with ESRD - HD T/T/S as o/p. No recent abx per pt. Tm 99.6. No labs yet. Cultures pending.  Goal of Therapy:  Vancomycin pre-HD level 15-25 mcg/ml  Plan:  1. Zosyn 2.25gm IV q8h 2. Vancomycin 1gm IV now then 500mg  IV with HD (T/T/S) 3. Will f/u micro data, pt's clinical condition, HD schedule/tolerance 4. Vanc pre-HD level prn  Sherlon Handing, PharmD, BCPS Clinical pharmacist, pager [phone] 04/18/2014,1:21 PM   ADDN: Pharmacy was consulted to dose cefepime and flagyl in place of zosyn for diabetic necrotic foot infection of left fourth toe. Pt received one dose of zosyn prior to d/c. Pt has WBC of 12.9, sCr 9.56 on HD T/T/S, and remains afebrile.   PLAN: Cefepime 2g IV qHD (T/T/S) Metronidazoe 500mg  IV q8h Followup on any potential changes in HD  schedule  Andrey Cota. Diona Foley, PharmD Clinical Pharmacist Pager [phone]

## 2014-04-18 NOTE — Progress Notes (Addendum)
Family Practice Teaching Service Interval Progress Note  Continuing to monitor blood pressure. Patients BP at this time is 88/53. Steady MAP in 57-65 range at this time. Lactate was 0.7. On review of records from patients other MRN chart, [id number], it appears that he has low BP at baseline. Several of his most recent admissions he has had persistent BPs in the 123456 systolic. He has been on midodrine in the past for his orthostatic hypotension. At this time he appears to be near his baseline. Lactate of 0.7 is in the normal range making hypoperfusion less likely at this time. Lungs sounded clear on auscultation at this time. Patient will need dialysis tomorrow as he is TTS schedule and has received 1.5 L fluid with his low BPs. Will continue to monitor at this time.   Tommi Rumps, MD Family Medicine PGY-3 Service Pager [phone]

## 2014-04-18 NOTE — ED Notes (Signed)
MD at bedside. 

## 2014-04-18 NOTE — ED Notes (Signed)
Xray contacted regarding delay, spoke with 6E states is in agreement to transport patient directly to unit from Xray.

## 2014-04-18 NOTE — ED Notes (Signed)
Pt placed into gown and on monitor upon arrival to room. Pt monitored by blood pressure and pulse ox.  

## 2014-04-18 NOTE — H&P (Signed)
[facility] Admission History and Physical Service Pager: [phone]  Patient name: Alvin Daniels Medical record number: [id number] Date of birth: 11/15/1970 Age: 43 y.o. Gender: male  Primary Care Provider: No primary care provider on file. Consultants: Orthopedics, Renal Code Status: Full  Chief Complaint: Back pain, left 4th toe pain  Assessment and Plan: Alvin Daniels is a 43 y.o. male presenting with left 4th toe necrosis. PMH is significant for ESRD on dialysis, T2DM, chronic HCV, and HIV.   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 Patient meets sepsis criteria on admission (HR and WBC) and also with subjective fevers. No evidence of cellulitis in left foot. Foot plain film consistent with osteomyelitis. Suspect this has been present for longer than one week given appearance.  -s/p 1 dose of vanc and zosyn in ED -f/u blood cultures and wound culture -vanc (12/7- ), cefepime (12/7- ), flagyl (12/7- ) -morphine prn pain -Consider aggressive IVF resuscitation if patient's HR is not improving -Consulted wound care, appreciate assistance -Consulted orthopedics, appreciate assistance - Dr Tonita Cong to see - f/u AM CBC, ESR. CRP -consider ABIs, though has good pulses on exam  #Back Pain. Seems to be more in MSK in etiology. No alarm signs or symptoms concerning for epidural abscess, cauda equina syndrome, or other neurological impingement. No imaging indicated at this time. -conservative management -morphine for pain at this time -consider PT once decision made on potential intervention for his toe  #T2DM with polyneuropathy. Patient reports being on 70/30 insulin twice daily (20 in the morning, 35 at night), however on chart review (patient with 2 charts set for merging), patient appears to be on 75/25 15U twice daily. A1c 5.7 in June. -SSI -f/u A1C here - if elevated cbgs will consider addition of long acting insulin  #Normocytic anemia. Hgb 10.2, MCV  98 on admission. Baseline HgB 11-13. Iron panel in 2014 not consistent with iron deficiency. Likely related to anemia of chronic disease. -Consider B12/folate -monitor on am cbc  #ESRD on dialysis. On TTS schedule. Does not know name of dialysis center.  -Call renal in the morning for HD  #HIV, chronic HCV. CD4 count 260 in October. Followed by Dr [name].  -Continue home prezista, emtriva, norvir, and viread  FEN/GI: Carb modified diet, Saline lock IV Prophylaxis: Subcutaneous heparin  Disposition: Admitted to tele under attending Dr Mingo Amber pending above management and evaluation.  History of Present Illness: Alvin Daniels is a 43 y.o. male presenting with back pain and foot pain.   Patient reports that his back pain hurts more than his foot pain. Pain located in the middle of his lower back. Pain started approximately 2 weeks ago. No history of trauma or heavy lifting. Patient reports that lying on his back hurts and that twisting his torso makes the pain worse. Patient thought it was going to go away, but it did not. Pain described as sharp and occasionally radiating into his neck. When the pain is severe, the patient can not walk because of the pain. Nothing makes pain better. Endorses nausea. Some chills. No bowel or bladder incontinence. No saddle anesthesia. Some weakness in legs.   Patient reports that his left 4th toe started the become black about 1 week ago. He experienced no pain during this time. Yesterday, the patient was in the shower and noticed swelling with lots of foul smelling drainage and bleeding. He has noticed more pain since then. He additionally endorses numbness in his feet, which often occurs with exposure to cold weather.  Has to soak his feet in warm in warm water to get them to move again.   Patient has history of diabetes. Patient reports uses 70/30 insulin twice daily (20 in the morning, 35 at night). Has been on insulin for 7 years. Has been told that he  has peripheral vascular disease. Denies smoking and drug use.   On TTS schedule for dialysis. Patient makes no urine. Patient is not sure which dialysis center he goes to (patient is illiterate). Patient does not know the name of his PCP.  In the ED, labs were remarkable for leukocytosis (WBC 12.9), anemia (HgB 10.2), hyponatremia (132), and hypchloremia (90). Blood cultures were obtained and the patient was started on vancomycin and zosyn.  Review Of Systems: Per HPI, Otherwise 12 point review of systems was performed and was unremarkable.  There are no active problems to display for this patient.  Past Medical History: Past Medical History  Diagnosis Date  . Diabetes mellitus without complication   . Renal disorder   HIV Hep C ESRD Polyneuropathy of DM  Past Surgical History: Left AV fistula Social History: History  Substance Use Topics  . Smoking status: Never Smoker   . Smokeless tobacco: Not on file  . Alcohol Use: No   Additional social history: Denies tobacco, alcohol, and drug use.  Please also refer to relevant sections of EMR.  Family History: No family history on file. Allergies and Medications: No Known Allergies No current facility-administered medications on file prior to encounter.   No current outpatient prescriptions on file prior to encounter.    Objective: BP 113/69 mmHg  Pulse 95  Temp(Src) 99.6 F (37.6 C) (Oral)  Resp 22  Wt 108 lb 0.4 oz (49 kg)  SpO2 100% Exam: General: NAD, thin appearing man lying in bed HEENT: NCAT, MMM, PERRL Cardiovascular: RRR, no murmurs appreciated Respiratory: NWOB, CTAB Abdomen: +BS, soft, NT, ND Back: Nontender over vertebral column, no deformities, no erythema. Mildly tender of bilateral SI joints. No muscle spasm noted. No midline tenderness. Extremities: AVF on LUE with palpable thrill. Left 4th toe black and necrotic appearing with foul smelling discharge. DP pulses palpable bilaterally. Sclerotic changes  of venous stasis evident on dorsal aspects of feet bilaterally Skin: No rashes. Neuro: CN2-12 intact. Strength in lower extremities 5/5 and symmetric bilaterally. Decreased sensation to light touch in feet bilaterally. No sensation in left 4th toe.Kermit Balo rectal tone. Sensation intact on rectal exam.  Labs and Imaging: CBC BMET   Recent Labs Lab 04/18/14 1300  WBC 12.9*  HGB 10.2*  HCT 30.0*  PLT 207    Recent Labs Lab 04/18/14 1300  NA 132*  K 4.5  CL 90*  CO2 24  BUN 36*  CREATININE 9.56*  GLUCOSE 158*  CALCIUM 9.1     Dg Foot Complete Left  04/18/2014   CLINICAL DATA:  Left fourth toe necrosis  EXAM: LEFT FOOT - COMPLETE 3+ VIEW  COMPARISON:  None.  FINDINGS: There is considerable air the soft tissues surrounding the fourth digit particularly adjacent to the proximal phalanx. There are changes suggestive of prior fracture with some impaction and bony resorption consistent with osteomyelitis. These changes are most noted in the lateral aspect of the fourth proximal phalanx and middle phalanx. Mild vascular calcifications are noted. No other acute bony abnormality is seen.  IMPRESSION: Subcutaneous air in the fourth digit with changes of prior fracture of the fourth proximal phalanx with resorption in the fourth proximal and middle phalanges. These changes  are consistent with osteomyelitis.   Electronically Signed   By: Inez Catalina M.D.   On: 04/18/2014 16:22   Dimas Chyle, MD 04/18/2014, 2:34 PM PGY-1, [place] Intern pager: [phone], text pages welcome  Upper Level Addendum:  I have seen and evaluated this patient along with Dr. Jerline Pain and reviewed the above note, making necessary revisions in red.   Tommi Rumps, MD Family Medicine PGY-3

## 2014-04-18 NOTE — ED Notes (Signed)
Report given to [place], [place]

## 2014-04-18 NOTE — Progress Notes (Addendum)
Family Practice Teaching Service Interval Progress Note  BP on recheck at 62/33. On next check 88/50. Patient is not tachycardic at this time. Will give an additional 500 cc bolus at this time. Lactic acid ordered stat as well to evaluate perfusion. Check BP following this and if declines again will consult ccm for recommendations.   Tommi Rumps, MD Family Medicine PGY-3 Service Pager [phone]

## 2014-04-18 NOTE — ED Notes (Signed)
Attempted report at 1513.

## 2014-04-19 ENCOUNTER — Other Ambulatory Visit (HOSPITAL_COMMUNITY): Payer: Self-pay | Admitting: Orthopedic Surgery

## 2014-04-19 DIAGNOSIS — E119 Type 2 diabetes mellitus without complications: Secondary | ICD-10-CM

## 2014-04-19 DIAGNOSIS — Z992 Dependence on renal dialysis: Secondary | ICD-10-CM

## 2014-04-19 DIAGNOSIS — I479 Paroxysmal tachycardia, unspecified: Secondary | ICD-10-CM

## 2014-04-19 DIAGNOSIS — L02612 Cutaneous abscess of left foot: Secondary | ICD-10-CM

## 2014-04-19 DIAGNOSIS — B182 Chronic viral hepatitis C: Secondary | ICD-10-CM | POA: Insufficient documentation

## 2014-04-19 DIAGNOSIS — A419 Sepsis, unspecified organism: Secondary | ICD-10-CM

## 2014-04-19 DIAGNOSIS — N186 End stage renal disease: Secondary | ICD-10-CM

## 2014-04-19 DIAGNOSIS — L089 Local infection of the skin and subcutaneous tissue, unspecified: Secondary | ICD-10-CM

## 2014-04-19 DIAGNOSIS — B1920 Unspecified viral hepatitis C without hepatic coma: Secondary | ICD-10-CM

## 2014-04-19 DIAGNOSIS — R9431 Abnormal electrocardiogram [ECG] [EKG]: Secondary | ICD-10-CM

## 2014-04-19 DIAGNOSIS — B20 Human immunodeficiency virus [HIV] disease: Secondary | ICD-10-CM

## 2014-04-19 DIAGNOSIS — I959 Hypotension, unspecified: Secondary | ICD-10-CM

## 2014-04-19 DIAGNOSIS — M869 Osteomyelitis, unspecified: Secondary | ICD-10-CM

## 2014-04-19 LAB — MAGNESIUM: Magnesium: 2.2 mg/dL (ref 1.5–2.5)

## 2014-04-19 LAB — CBC
HCT: 28.3 % — ABNORMAL LOW (ref 39.0–52.0)
HCT: 33.4 % — ABNORMAL LOW (ref 39.0–52.0)
HEMOGLOBIN: 9.2 g/dL — AB (ref 13.0–17.0)
Hemoglobin: 11 g/dL — ABNORMAL LOW (ref 13.0–17.0)
MCH: 31.5 pg (ref 26.0–34.0)
MCH: 32.1 pg (ref 26.0–34.0)
MCHC: 32.5 g/dL (ref 30.0–36.0)
MCHC: 32.9 g/dL (ref 30.0–36.0)
MCV: 96.9 fL (ref 78.0–100.0)
MCV: 97.4 fL (ref 78.0–100.0)
PLATELETS: 192 10*3/uL (ref 150–400)
PLATELETS: 197 10*3/uL (ref 150–400)
RBC: 2.92 MIL/uL — AB (ref 4.22–5.81)
RBC: 3.43 MIL/uL — AB (ref 4.22–5.81)
RDW: 14.4 % (ref 11.5–15.5)
RDW: 14.4 % (ref 11.5–15.5)
WBC: 14.3 10*3/uL — ABNORMAL HIGH (ref 4.0–10.5)
WBC: 15.2 10*3/uL — ABNORMAL HIGH (ref 4.0–10.5)

## 2014-04-19 LAB — GLUCOSE, CAPILLARY
Glucose-Capillary: 150 mg/dL — ABNORMAL HIGH (ref 70–99)
Glucose-Capillary: 244 mg/dL — ABNORMAL HIGH (ref 70–99)
Glucose-Capillary: 135 mg/dL — ABNORMAL HIGH (ref 70–99)

## 2014-04-19 LAB — RENAL FUNCTION PANEL
ALBUMIN: 2.5 g/dL — AB (ref 3.5–5.2)
Anion gap: 19 — ABNORMAL HIGH (ref 5–15)
BUN: 43 mg/dL — ABNORMAL HIGH (ref 6–23)
CHLORIDE: 95 meq/L — AB (ref 96–112)
CO2: 19 mEq/L (ref 19–32)
CREATININE: 10.81 mg/dL — AB (ref 0.50–1.35)
Calcium: 8.5 mg/dL (ref 8.4–10.5)
GFR, EST AFRICAN AMERICAN: 6 mL/min — AB (ref >90)
GFR, EST NON AFRICAN AMERICAN: 5 mL/min — AB (ref >90)
Glucose, Bld: 142 mg/dL — ABNORMAL HIGH (ref 70–99)
POTASSIUM: 5.1 meq/L (ref 3.7–5.3)
Phosphorus: 2.9 mg/dL (ref 2.3–4.6)
SODIUM: 133 meq/L — AB (ref 137–147)

## 2014-04-19 LAB — ABO/RH: ABO/RH(D): O POS

## 2014-04-19 LAB — BASIC METABOLIC PANEL
ANION GAP: 17 — AB (ref 5–15)
BUN: 41 mg/dL — ABNORMAL HIGH (ref 6–23)
CHLORIDE: 96 meq/L (ref 96–112)
CO2: 20 mEq/L (ref 19–32)
Calcium: 8.5 mg/dL (ref 8.4–10.5)
Creatinine, Ser: 10.6 mg/dL — ABNORMAL HIGH (ref 0.50–1.35)
GFR, EST AFRICAN AMERICAN: 6 mL/min — AB (ref >90)
GFR, EST NON AFRICAN AMERICAN: 5 mL/min — AB (ref >90)
Glucose, Bld: 138 mg/dL — ABNORMAL HIGH (ref 70–99)
POTASSIUM: 4.8 meq/L (ref 3.7–5.3)
Sodium: 133 mEq/L — ABNORMAL LOW (ref 137–147)

## 2014-04-19 LAB — TYPE AND SCREEN
ABO/RH(D): O POS
Antibody Screen: NEGATIVE

## 2014-04-19 LAB — TROPONIN I
Troponin I: <0.3 ng/mL (ref <0.30)
Troponin I: <0.3 ng/mL (ref <0.30)

## 2014-04-19 LAB — PHOSPHORUS: Phosphorus: 2.9 mg/dL (ref 2.3–4.6)

## 2014-04-19 LAB — HEMOGLOBIN A1C
Hgb A1c MFr Bld: 6 % — ABNORMAL HIGH (ref <5.7)
Mean Plasma Glucose: 126 mg/dL — ABNORMAL HIGH (ref <117)

## 2014-04-19 LAB — C-REACTIVE PROTEIN: CRP: 17.8 mg/dL — ABNORMAL HIGH (ref <0.60)

## 2014-04-19 LAB — LACTIC ACID, PLASMA: Lactic Acid, Venous: 0.6 mmol/L (ref 0.5–2.2)

## 2014-04-19 MED ORDER — ALTEPLASE 2 MG IJ SOLR
2.0000 mg | Freq: Once | INTRAMUSCULAR | Status: DC | PRN
  Filled 2014-04-19: qty 2

## 2014-04-19 MED ORDER — DARBEPOETIN ALFA 100 MCG/0.5ML IJ SOSY
100.0000 ug | PREFILLED_SYRINGE | INTRAMUSCULAR | Status: DC
  Administered 2014-04-19: 100 ug via INTRAVENOUS

## 2014-04-19 MED ORDER — EMTRICITABINE 200 MG PO CAPS
200.0000 mg | ORAL_CAPSULE | ORAL | Status: DC
  Administered 2014-04-19 – 2014-04-23 (×2): 200 mg via ORAL
  Filled 2014-04-19 (×2): qty 1

## 2014-04-19 MED ORDER — TRAMADOL HCL 50 MG PO TABS
50.0000 mg | ORAL_TABLET | Freq: Once | ORAL | Status: AC
  Administered 2014-04-19: 50 mg via ORAL
  Filled 2014-04-19: qty 1

## 2014-04-19 MED ORDER — NEPRO/CARBSTEADY PO LIQD
237.0000 mL | ORAL | Status: DC | PRN
  Filled 2014-04-19: qty 237

## 2014-04-19 MED ORDER — DOXERCALCIFEROL 4 MCG/2ML IV SOLN
1.0000 ug | INTRAVENOUS | Status: DC
  Administered 2014-04-19 – 2014-04-23 (×3): 1 ug via INTRAVENOUS
  Filled 2014-04-19 (×3): qty 2

## 2014-04-19 MED ORDER — ACETAMINOPHEN 325 MG PO TABS
ORAL_TABLET | ORAL | Status: AC
  Administered 2014-04-19: 650 mg via ORAL
  Filled 2014-04-19: qty 2

## 2014-04-19 MED ORDER — SODIUM CHLORIDE 0.9 % IV SOLN
125.0000 mg | INTRAVENOUS | Status: DC
  Administered 2014-04-19 – 2014-04-21 (×2): 125 mg via INTRAVENOUS
  Filled 2014-04-19 (×4): qty 10

## 2014-04-19 MED ORDER — HEPARIN SODIUM (PORCINE) 1000 UNIT/ML DIALYSIS: 1000.0000 [IU] | INTRAMUSCULAR | Status: DC | PRN

## 2014-04-19 MED ORDER — SODIUM CHLORIDE 0.9 % IV SOLN: 100.0000 mL | INTRAVENOUS | Status: DC | PRN

## 2014-04-19 MED ORDER — SODIUM CHLORIDE 0.9 % IV BOLUS (SEPSIS)
250.0000 mL | Freq: Once | INTRAVENOUS | Status: AC
  Administered 2014-04-19: 250 mL via INTRAVENOUS

## 2014-04-19 MED ORDER — SODIUM CHLORIDE 0.9 % IV SOLN
INTRAVENOUS | Status: AC
  Administered 2014-04-19: 01:00:00 via INTRAVENOUS

## 2014-04-19 MED ORDER — DOXERCALCIFEROL 4 MCG/2ML IV SOLN
INTRAVENOUS | Status: AC
Start: 2014-04-19 — End: 2014-04-19
  Administered 2014-04-19: 1 ug
  Filled 2014-04-19: qty 2

## 2014-04-19 MED ORDER — DARBEPOETIN ALFA 100 MCG/0.5ML IJ SOSY
PREFILLED_SYRINGE | INTRAMUSCULAR | Status: AC
Start: 2014-04-19 — End: 2014-04-19
  Administered 2014-04-19: 100 ug
  Filled 2014-04-19: qty 0.5

## 2014-04-19 MED ORDER — MIDODRINE HCL 5 MG PO TABS
5.0000 mg | ORAL_TABLET | Freq: Three times a day (TID) | ORAL | Status: DC
  Administered 2014-04-19 – 2014-04-20 (×2): 5 mg via ORAL
  Filled 2014-04-19 (×6): qty 1

## 2014-04-19 MED ORDER — MIDODRINE HCL 2.5 MG PO TABS
2.5000 mg | ORAL_TABLET | Freq: Once | ORAL | Status: AC
  Administered 2014-04-19: 2.5 mg via ORAL
  Filled 2014-04-19: qty 1

## 2014-04-19 MED ORDER — ACETAMINOPHEN 325 MG PO TABS
650.0000 mg | ORAL_TABLET | Freq: Once | ORAL | Status: AC
  Administered 2014-04-19: 650 mg via ORAL

## 2014-04-19 MED ORDER — PENTAFLUOROPROP-TETRAFLUOROETH EX AERO: 1.0000 "application " | INHALATION_SPRAY | CUTANEOUS | Status: DC | PRN

## 2014-04-19 MED ORDER — LIDOCAINE-PRILOCAINE 2.5-2.5 % EX CREA
1.0000 | TOPICAL_CREAM | CUTANEOUS | Status: DC | PRN
Start: 2014-04-19 — End: 2014-04-19
  Filled 2014-04-19: qty 5

## 2014-04-19 MED ORDER — NEPRO/CARBSTEADY PO LIQD
237.0000 mL | Freq: Two times a day (BID) | ORAL | Status: DC
  Administered 2014-04-22 (×2): 237 mL via ORAL
  Filled 2014-04-19: qty 237

## 2014-04-19 MED ORDER — MIDODRINE HCL 2.5 MG PO TABS
2.5000 mg | ORAL_TABLET | Freq: Three times a day (TID) | ORAL | Status: DC
  Administered 2014-04-19 (×2): 2.5 mg via ORAL
  Filled 2014-04-19 (×5): qty 1

## 2014-04-19 MED ORDER — LIDOCAINE HCL (PF) 1 % IJ SOLN: 5.0000 mL | INTRAMUSCULAR | Status: DC | PRN

## 2014-04-19 NOTE — Progress Notes (Addendum)
Family Practice Teaching Service Interval Progress Note  Called by nursing to evaluate patient for low blood pressure of 75/35. When MAP calculated it is 48. Call parameters set earlier this morning following conversation with Dr Chase Caller who advised parameters of 123456 systolic BP and 123XX123 MAP as patient appeared to be mentating well and had normal lactic acid previously. Patient appears alert on exam and states he was able to sleep some. His lungs are clear on exam with no crackles noted. Given this blood pressure will give a bolus of 250 mL now as he does not appear volume overloaded at this time. Given his continued normal mentation he is likely perfusing well, though will repeat lactic acid to look for hypoperfusion. Will recheck BP following this bolus.   Addendum 6:08 am: BP now 81/45 with MAP of 57 after 70 cc of his bolus. He will get midodrine at 7 am. Suspect this continues to be related to his history of intermittent hypotension, though will follow-up the lactic acid to ensure good perfusion.   Tommi Rumps, MD Family Medicine PGY-3 Service Pager [phone]

## 2014-04-19 NOTE — Progress Notes (Signed)
Family Medicine Teaching Service Daily Progress Note Intern Pager: [phone]  Patient name: Alvin Daniels Medical record number: [id number] Date of birth: 1971/04/01 Age: 43 y.o. Gender: male  Primary Care Provider: No primary care provider on file. Consultants: Orthopedics, Wound Code Status: Full  Pt Overview and Major Events to Date:  12/7 - Admitted for left 4th toe osteomyelitis 12/7 - Transferred to step down unit due to persistent hypotension  Assessment and Plan: Alvin Daniels is a 43 y.o. male presenting with left 4th toe osteomyelisits. PMH is significant for PMH is significant for ESRD on dialysis, T2DM, chronic HCV, and HIV.   #Sepsis 2/2 left 4th toe osteomyelitis. Patient meets sepsis criteria on admission (HR, fever, and WBC) . Patient additionally became persistently hypotensive on the day of admission, requiring transfer to the step down unit. On chart review, it appears that the patient has low BP at baseline, with several recent admissions having SBP in the 70s-90s. Patient does not have an elevated lactic acid, and he appears well on exam making severe sepsis/septic shock less likely at this time. -s/p 1 dose of vanc and zosyn in ED -f/u blood cultures and wound culture -vanc (12/7- ), cefepime (12/7- ), flagyl (12/7- ) -morphine prn pain -Consulted orthopedics, appreciate assistance -I&D 12/7, will require amputation when stable -f/u ABIs -Started midodrine 12/8 -Low threshold for consulting CCM if patient continues to be hypotensive  #V-tach. Patient experienced a 70+ beat run on vtach at 0112 on 12/8. -f/u troponin -f/u EKG -f/u mg, phos -Cardiology consulted, appreciate assistance  #Back Pain. Seems to be more in MSK in etiology. No alarm signs or symptoms concerning for epidural abscess, cauda equina syndrome, or other neurological impingement. No imaging indicated at this time. -conservative management -morphine for pain at this  time -consider PT once decision made on potential intervention for his toe  #T2DM with polyneuropathy. Patient reports being on 70/30 insulin twice daily (20 in the morning, 35 at night), however on chart review (patient with 2 charts set for merging), patient appears to be on 75/25 15U twice daily. A1c 5.7 in June. -A1c 6.0 here -SSI  #Normocytic anemia. Hgb 10.2, MCV 98 on admission. Baseline HgB 11-13. Iron panel in 2014 not consistent with iron deficiency. Likely related to anemia of chronic disease. -Consider B12/folate -monitor on am cbc  #ESRD on dialysis. On TTS schedule. Does not know name of dialysis center.  -Call renal in the morning for HD  #HIV, chronic HCV. CD4 count 260 in October. Followed by Dr Alvin Daniels.  -Continue home prezista, emtriva, norvir, and viread  FEN/GI: Carb modified diet, Saline lock IV Prophylaxis: Subcutaneous heparin  Disposition: Admitted to step down pending above management.  Subjective:  Patient hypotensive overnight (as low as 60/38), received 1.5L total of NS. Also experienced an asymptomatic run of v-tach. Complaining of back pain this morning. No chest pain or shortness of breath. No fevers or chills. Asking for water.  Objective: Temp:  [99.6 F (37.6 C)-101.5 F (38.6 C)] 99.8 F (37.7 C) (12/08 0400) Pulse Rate:  [83-106] 90 (12/08 0629) Resp:  [14-26] 20 (12/08 0400) BP: (62-241)/(33-133) 74/45 mmHg (12/08 0629) SpO2:  [96 %-100 %] 99 % (12/08 0629) Weight:  [108 lb 0.4 oz (49 kg)] 108 lb 0.4 oz (49 kg) (12/07 1322) Physical Exam: General: NAD, lying in hospital bed Cardiovascular: RRR, no murmurs appreciated Respiratory: NWOB, CTAB, no wheezes or crackles Abdomen: +BS, soft, NT, ND Extremities: AVF on LUE with palpable thrill. Left 4th toe  black and necrotic appearing with foul smelling discharge. DP pulses palpable bilaterally. Sclerotic changes of venous stasis evident on dorsal aspects of feet  bilaterally  Laboratory:  Recent Labs Lab 04/18/14 1300 04/18/14 1715 04/19/14 0409  WBC 12.9* 13.9* 14.3*  HGB 10.2* 9.6* 9.2*  HCT 30.0* 28.8* 28.3*  PLT 207 208 192    Recent Labs Lab 04/18/14 1300 04/18/14 1715 04/19/14 0409  NA 132*  --  133*  K 4.5  --  4.8  CL 90*  --  96  CO2 24  --  20  BUN 36*  --  41*  CREATININE 9.56* 10.11* 10.60*  CALCIUM 9.1  --  8.5  PROT 8.6*  --   --   BILITOT 0.5  --   --   ALKPHOS 77  --   --   ALT 17  --   --   AST 27  --   --   GLUCOSE 158*  --  138*   Lactic acid 0.7 CRP 17.8 ESR 104  Imaging/Diagnostic Tests: Dg Foot Complete Left  04/18/2014   CLINICAL DATA:  Left fourth toe necrosis  EXAM: LEFT FOOT - COMPLETE 3+ VIEW  COMPARISON:  None.  FINDINGS: There is considerable air the soft tissues surrounding the fourth digit particularly adjacent to the proximal phalanx. There are changes suggestive of prior fracture with some impaction and bony resorption consistent with osteomyelitis. These changes are most noted in the lateral aspect of the fourth proximal phalanx and middle phalanx. Mild vascular calcifications are noted. No other acute bony abnormality is seen.  IMPRESSION: Subcutaneous air in the fourth digit with changes of prior fracture of the fourth proximal phalanx with resorption in the fourth proximal and middle phalanges. These changes are consistent with osteomyelitis.   Electronically Signed   By: Inez Catalina M.D.   On: 04/18/2014 16:22   Dimas Chyle, MD 04/19/2014, 6:57 AM PGY-1, [place] Intern pager: [phone], text pages welcome

## 2014-04-19 NOTE — Progress Notes (Addendum)
Family Practice Teaching Service Interval Progress Note  Received page from Dr Chase Caller from [facility]. He calls regarding this patients recent blood pressures. Most recent MAP is 47. I discussed the case with him regarding the patients blood pressure. Discussed my chart review findings of blood pressures in the 70's-80's on several of his past hospitalizations and that the patient had been on midodrine in the past for hypotension. Discussed the fluid boluses I gave earlier in the night and that the patient has appeared comfortable and has been mentating well throughout the night. He advised to start on midodrine at this time. If BP is below systolic of 70 will plan to treat per CCM recs. Will continue to monitor at this time.   Tommi Rumps, MD Family Medicine PGY-3 Service Pager [phone]

## 2014-04-19 NOTE — Care Management Note (Signed)
    Page 1 of 1   04/19/2014     3:04:36 PM CARE MANAGEMENT NOTE 04/19/2014  Patient:  Alvin Daniels, Alvin Daniels   Account Number:  [id number]  Date Initiated:  04/19/2014  Documentation initiated by:  Ajwa Kimberley  Subjective/Objective Assessment:   dx sepsis/(L) 4th toe osteo; lives alone    PCP  [facility]  CM consult      Status of service:  In process, will continue to follow Medicare Important Message given?  NO  Per UR Regulation:  Reviewed for med. necessity/level of care/duration of stay  Comments:  04/19/14 [place] MSN BSN CCM Pt is ESRD, talked with rep @ [facility] who states [facility], receives HIV meds there.  TC to [facility] Clinic, was informed that pt gets his insulin from [facility] where he is followed for primary care.

## 2014-04-19 NOTE — Progress Notes (Signed)
**  Interval Note**  I visited patient this evening.  Patient reports that he is feeling ok.  His back is bothering him.  I informed him that a Tramadol prescription was ordered by the day time providers.  RN brought this in during our visit.  Patient states that his toe is not hurting him but that he understands that there is a notable infection that will require surgical intervention in the morning.  He reports to me that the surgery and reasons for surgery were explained to him by orthopedic surgery earlier today.  RN asks me to ask patient to sign consent form that was missed this morning.  Patient able to voice good understanding to me of procedure and is agreeable to sign consent.  He understands that there is a possibility that he will need both the 4th and 5th digits removed tomorrow.  He also understands that he will be placed under general anesthesia tomorrow, requiring intubation during procedure.  Patient voices great appreciation for medical care being provided.  BP 84/46 in room, patient well appearing, alert, speech normal and understandable.  Voices no complaints at this time except that his back is bothering him.  Patient will let RN know if pain persists post Tramadol.  Zierra Laroque M. Lajuana Ripple, DO PGY-1, [facility] 04/19/14, 10:58pm

## 2014-04-19 NOTE — Progress Notes (Addendum)
INITIAL NUTRITION ASSESSMENT  DOCUMENTATION CODES Per approved criteria  -Not Applicable   INTERVENTION: Nepro Shake po BID, each supplement provides 425 kcal and 19 grams protein RD to follow for nutrition care plan  NUTRITION DIAGNOSIS: Increased nutrient needs related to catabolic illness, wound/post-op healing as evidenced by estimated nutrition needs  Goal: Pt to meet >/= 90% of their estimated nutrition needs   Monitor:  PO & supplemental intake, weight, labs, I/O's  Reason for Assessment: Consult  43 y.o. male  Admitting Dx: back & foot pain  ASSESSMENT: 43 yo Male with a history of HIV, IDDM, Hepatitis C, chronic hypotension, and ESRD on dialysis at the [facility] who presented yesterday with a necrotic left fourth toe with purulent drainage. He was started on Vancomycin and Cefepime and was seen by Dr. Sharol Given with fourth and fifth ray amputation planned for tomorrow.   Patient lives alone, and his social situation is difficult with little money for food or medications, but he states he is currently getting HIV medications and insulin. His history includes chronic diarrhea, which he currently denies, but he continues to receive up to 1 L of normal saline with each dialysis to support his chronically low blood pressure.   Patient currently in [place].  RD unable to obtain nutrition hx or complete Nutrition Focused Physical Exam at this time.  Orthopedic note reviewed.  Plan for fourth and possible fifth ray amputation left foot Wednesday.  Nutrient needs increased given catabolic illness, wound/post-op healing.  Currently on a Carbohydrate Modified diet.  NPO after midnight.  RD to order scheduled oral nutrition supplements.  Height: Not on file  Weight: Wt Readings from Last 1 Encounters:  04/19/14 115 lb 1.3 oz (52.2 kg)    Ideal Body Weight: unable to assess  % Ideal Body Weight: unable to assess  Wt Readings from Last 10 Encounters:   04/19/14 115 lb 1.3 oz (52.2 kg)    Usual Body Weight: ---  % Usual Body Weight: ---  BMI:  Unable to calculate   Estimated Nutritional Needs: Kcal: 1800-2000 Protein: 90-100 gm Fluid: 1200 ml  Skin: diabetic toe ulcer (blackened, necrotic tissue)  Diet Order: Diet Carb Modified Diet NPO time specified  EDUCATION NEEDS: -No education needs identified at this time   Intake/Output Summary (Last 24 hours) at 04/19/14 1513 Last data filed at 04/18/14 1700  Gross per 24 hour  Intake    240 ml  Output      0 ml  Net    240 ml    Labs:   Recent Labs Lab 04/18/14 1300 04/18/14 1715 04/19/14 0409 04/19/14 0811  NA 132*  --  133* 133*  K 4.5  --  4.8 5.1  CL 90*  --  96 95*  CO2 24  --  20 19  BUN 36*  --  41* 43*  CREATININE 9.56* 10.11* 10.60* 10.81*  CALCIUM 9.1  --  8.5 8.5  MG  --   --   --  2.2  PHOS  --   --   --  2.9  2.9  GLUCOSE 158*  --  138* 142*    CBG (last 3)   Recent Labs  04/18/14 2201 04/19/14 0807 04/19/14 1235  GLUCAP 173* 150* 244*    Scheduled Meds: . ceFEPime (MAXIPIME) IV  2 g Intravenous Q T,Th,Sat-1800  . [START ON 04/26/2014] darbepoetin (ARANESP) injection - DIALYSIS  100 mcg Intravenous Q Tue-HD  . Darunavir Ethanolate  800 mg Oral  Q breakfast  . doxercalciferol  1 mcg Intravenous Q T,Th,Sa-HD  . emtricitabine  200 mg Oral Once per day on Tue Sat  . [START ON 04/21/2014] ferric gluconate (FERRLECIT/NULECIT) IV  125 mg Intravenous Q T,Th,Sa-HD  . heparin  5,000 Units Subcutaneous 3 times per day  . insulin aspart  0-5 Units Subcutaneous QHS  . insulin aspart  0-9 Units Subcutaneous TID WC  . metronidazole  500 mg Intravenous Q8H  . midodrine  5 mg Oral TID WC  . pantoprazole  40 mg Oral Daily  . ritonavir  100 mg Oral Q breakfast  . sodium chloride  3 mL Intravenous Q12H  . sodium chloride  3 mL Intravenous Q12H  . [START ON 04/24/2014] tenofovir  300 mg Oral Q Sun  . vancomycin  500 mg Intravenous Q T,Th,Sa-HD     Continuous Infusions:   Past Medical History  Diagnosis Date  . Diabetes mellitus without complication   . Renal disorder     No past surgical history on file.  Arthur Holms, RD, LDN Pager #: [phone] After-Hours Pager #: [phone]

## 2014-04-19 NOTE — Consult Note (Signed)
[facility] consulted, however after review of chart patients foot wounds are being managed currently per orthopedics with mention of amputation. Additionally plain films indicate osteomyelitis which is considered outside of the scope of practice for the [facility] nurse.  Will not consult for this reason.  Rayansh Herbst [place] RN,CWOCN [id number]

## 2014-04-19 NOTE — Consult Note (Signed)
[facility] Renal Consultation Note  Indication for Consultation:  Management of ESRD/hemodialysis; anemia, hypertension/volume and secondary hyperparathyroidism  HPI: Alvin Daniels is a 43 y.o. Spanish-speaking male with a history of HIV, IDDM, Hepatitis C, chronic hypotension, and ESRD on dialysis at the [facility] who presented yesterday with a necrotic left fourth toe with purulent drainage, first noticed two days ago.  He was started on Vancomycin and Cefepime and was seen by Dr. Sharol Given with fourth and fifth ray amputation planned for tomorrow.  He lives alone, and his social situation is difficult with little money for food or medications, but he states he is currently getting HIV medications and insulin.  His history includes chronic diarrhea, which he currently denies, but he continues to receive up to 1 L of normal saline with each dialysis to support his chronically low blood pressure.   Dialysis Orders:   TTS @ [facility] 4 hrs      51.5 kg      2K/2,25Ca       400/A1.5       Heparin 5300 U       AVF @ LFA   Hectorol 1 mcg        Aranesp 25 mcg q2wk (starting 12/8)        Venofer 100 mg x 5 (through 12/10)  Past Medical History  Diagnosis Date  . Diabetes mellitus without complication   . Renal disorder    No past surgical history on file. No family history on file.  Social History He moved to the [place] from [place] ten years ago and has been on dialysis since 03/2010.  He denies any history of tobacco, alcohol, or illicit drug use.  No Known Allergies Prior to Admission medications   Medication Sig Start Date End Date Taking? Authorizing Provider  Darunavir Ethanolate (PREZISTA) 800 MG tablet Take 800 mg by mouth daily with breakfast.   Yes Historical Provider, MD  emtricitabine (EMTRIVA) 200 MG capsule Take 200 mg by mouth See admin instructions. *takes every Tuesday and Saturday following dialysis*   Yes Historical Provider, MD  omeprazole  (PRILOSEC) 40 MG capsule Take 40 mg by mouth daily.   Yes Historical Provider, MD  ritonavir (NORVIR) 100 MG TABS tablet Take 100 mg by mouth daily with breakfast.   Yes Historical Provider, MD  tenofovir (VIREAD) 300 MG tablet Take 300 mg by mouth every Sunday.   Yes Historical Provider, MD   Labs:  Results for orders placed or performed during the hospital encounter of 04/18/14 (from the past 48 hour(s))  Wound culture     Status: None (Preliminary result)   Collection Time: 04/18/14 12:40 PM  Result Value Ref Range   Specimen Description WOUND LEFT TOE    Special Requests NONE    Gram Stain      RARE WBC PRESENT, PREDOMINANTLY PMN NO SQUAMOUS EPITHELIAL CELLS SEEN FEW GRAM NEGATIVE RODS Performed at [facility]    Culture PENDING    Report Status PENDING   CBC with Differential     Status: Abnormal   Collection Time: 04/18/14  1:00 PM  Result Value Ref Range   WBC 12.9 (H) 4.0 - 10.5 K/uL   RBC 3.06 (L) 4.22 - 5.81 MIL/uL   Hemoglobin 10.2 (L) 13.0 - 17.0 g/dL   HCT 30.0 (L) 39.0 - 52.0 %   MCV 98.0 78.0 - 100.0 fL   MCH 33.3 26.0 - 34.0 pg   MCHC 34.0 30.0 - 36.0  g/dL   RDW 14.5 11.5 - 15.5 %   Platelets 207 150 - 400 K/uL   Neutrophils Relative % 82 (H) 43 - 77 %   Neutro Abs 10.7 (H) 1.7 - 7.7 K/uL   Lymphocytes Relative 11 (L) 12 - 46 %   Lymphs Abs 1.4 0.7 - 4.0 K/uL   Monocytes Relative 6 3 - 12 %   Monocytes Absolute 0.7 0.1 - 1.0 K/uL   Eosinophils Relative 1 0 - 5 %   Eosinophils Absolute 0.1 0.0 - 0.7 K/uL   Basophils Relative 0 0 - 1 %   Basophils Absolute 0.0 0.0 - 0.1 K/uL  Comprehensive metabolic panel     Status: Abnormal   Collection Time: 04/18/14  1:00 PM  Result Value Ref Range   Sodium 132 (L) 137 - 147 mEq/L   Potassium 4.5 3.7 - 5.3 mEq/L   Chloride 90 (L) 96 - 112 mEq/L   CO2 24 19 - 32 mEq/L   Glucose, Bld 158 (H) 70 - 99 mg/dL   BUN 36 (H) 6 - 23 mg/dL   Creatinine, Ser 9.56 (H) 0.50 - 1.35 mg/dL   Calcium 9.1 8.4 - 10.5 mg/dL    Total Protein 8.6 (H) 6.0 - 8.3 g/dL   Albumin 3.0 (L) 3.5 - 5.2 g/dL   AST 27 0 - 37 U/L    Comment: HEMOLYSIS AT THIS LEVEL MAY AFFECT RESULT   ALT 17 0 - 53 U/L   Alkaline Phosphatase 77 39 - 117 U/L   Total Bilirubin 0.5 0.3 - 1.2 mg/dL   GFR calc non Af Amer 6 (L) >90 mL/min   GFR calc Af Amer 7 (L) >90 mL/min    Comment: (NOTE) The eGFR has been calculated using the CKD EPI equation. This calculation has not been validated in all clinical situations. eGFR's persistently <90 mL/min signify possible Chronic Kidney Disease.    Anion gap 18 (H) 5 - 15  Blood culture (routine x 2)     Status: None (Preliminary result)   Collection Time: 04/18/14  1:00 PM  Result Value Ref Range   Specimen Description BLOOD RIGHT HAND    Special Requests BOTTLES DRAWN AEROBIC AND ANAEROBIC 5CC    Culture  Setup Time      04/18/2014 18:24 Performed at [place] NO GROWTH TO DATE CULTURE WILL BE HELD FOR 5 DAYS BEFORE ISSUING A FINAL NEGATIVE REPORT Performed at [facility]    Report Status PENDING   Blood culture (routine x 2)     Status: None (Preliminary result)   Collection Time: 04/18/14  1:05 PM  Result Value Ref Range   Specimen Description BLOOD RIGHT ANTECUBITAL    Special Requests BOTTLES DRAWN AEROBIC AND ANAEROBIC 5CC    Culture  Setup Time      04/18/2014 18:23 Performed at [place] NO GROWTH TO DATE CULTURE WILL BE HELD FOR 5 DAYS BEFORE ISSUING A FINAL NEGATIVE REPORT Performed at [facility]    Report Status PENDING   Hemoglobin A1c     Status: Abnormal   Collection Time: 04/18/14  5:15 PM  Result Value Ref Range   Hgb A1c MFr Bld 6.0 (H) <5.7 %    Comment: (NOTE)  According to the ADA Clinical Practice Recommendations for 2011, when HbA1c is used as a screening test:   >=6.5%   Diagnostic of Diabetes Mellitus           (if abnormal result is confirmed) 5.7-6.4%   Increased risk of developing Diabetes Mellitus References:Diagnosis and Classification of Diabetes Mellitus,Diabetes SHFW,2637,85(YIFOY 1):S62-S69 and Standards of Medical Care in         Diabetes - 2011,Diabetes DXAJ,2878,67 (Suppl 1):S11-S61.    Mean Plasma Glucose 126 (H) <117 mg/dL    Comment: Performed at [facility]  Sedimentation rate     Status: Abnormal   Collection Time: 04/18/14  5:15 PM  Result Value Ref Range   Sed Rate 104 (H) 0 - 16 mm/hr  C-reactive protein     Status: Abnormal   Collection Time: 04/18/14  5:15 PM  Result Value Ref Range   CRP 17.8 (H) <0.60 mg/dL    Comment: Performed at [facility]  CBC     Status: Abnormal   Collection Time: 04/18/14  5:15 PM  Result Value Ref Range   WBC 13.9 (H) 4.0 - 10.5 K/uL   RBC 3.00 (L) 4.22 - 5.81 MIL/uL   Hemoglobin 9.6 (L) 13.0 - 17.0 g/dL   HCT 28.8 (L) 39.0 - 52.0 %   MCV 96.0 78.0 - 100.0 fL   MCH 32.0 26.0 - 34.0 pg   MCHC 33.3 30.0 - 36.0 g/dL   RDW 14.4 11.5 - 15.5 %   Platelets 208 150 - 400 K/uL  Creatinine, serum     Status: Abnormal   Collection Time: 04/18/14  5:15 PM  Result Value Ref Range   Creatinine, Ser 10.11 (H) 0.50 - 1.35 mg/dL   GFR calc non Af Amer 6 (L) >90 mL/min   GFR calc Af Amer 6 (L) >90 mL/min    Comment: (NOTE) The eGFR has been calculated using the CKD EPI equation. This calculation has not been validated in all clinical situations. eGFR's persistently <90 mL/min signify possible Chronic Kidney Disease.   Glucose, capillary     Status: Abnormal   Collection Time: 04/18/14  5:35 PM  Result Value Ref Range   Glucose-Capillary 67 (L) 70 - 99 mg/dL  Glucose, capillary     Status: None   Collection Time: 04/18/14  6:25 PM  Result Value Ref Range   Glucose-Capillary 98 70 - 99 mg/dL  Glucose, capillary     Status: Abnormal   Collection Time: 04/18/14  8:21 PM   Result Value Ref Range   Glucose-Capillary 120 (H) 70 - 99 mg/dL  Lactic acid, plasma     Status: None   Collection Time: 04/18/14 10:01 PM  Result Value Ref Range   Lactic Acid, Venous 0.7 0.5 - 2.2 mmol/L  Glucose, capillary     Status: Abnormal   Collection Time: 04/18/14 10:01 PM  Result Value Ref Range   Glucose-Capillary 173 (H) 70 - 99 mg/dL  Basic metabolic panel     Status: Abnormal   Collection Time: 04/19/14  4:09 AM  Result Value Ref Range   Sodium 133 (L) 137 - 147 mEq/L   Potassium 4.8 3.7 - 5.3 mEq/L   Chloride 96 96 - 112 mEq/L   CO2 20 19 - 32 mEq/L   Glucose, Bld 138 (H) 70 - 99 mg/dL   BUN 41 (H) 6 - 23 mg/dL   Creatinine, Ser 10.60 (H) 0.50 - 1.35 mg/dL   Calcium 8.5 8.4 - 10.5 mg/dL  GFR calc non Af Amer 5 (L) >90 mL/min   GFR calc Af Amer 6 (L) >90 mL/min    Comment: (NOTE) The eGFR has been calculated using the CKD EPI equation. This calculation has not been validated in all clinical situations. eGFR's persistently <90 mL/min signify possible Chronic Kidney Disease.    Anion gap 17 (H) 5 - 15  CBC     Status: Abnormal   Collection Time: 04/19/14  4:09 AM  Result Value Ref Range   WBC 14.3 (H) 4.0 - 10.5 K/uL   RBC 2.92 (L) 4.22 - 5.81 MIL/uL   Hemoglobin 9.2 (L) 13.0 - 17.0 g/dL   HCT 28.3 (L) 39.0 - 52.0 %   MCV 96.9 78.0 - 100.0 fL   MCH 31.5 26.0 - 34.0 pg   MCHC 32.5 30.0 - 36.0 g/dL   RDW 14.4 11.5 - 15.5 %   Platelets 192 150 - 400 K/uL  Lactic acid, plasma     Status: None   Collection Time: 04/19/14  6:30 AM  Result Value Ref Range   Lactic Acid, Venous 0.6 0.5 - 2.2 mmol/L  Glucose, capillary     Status: Abnormal   Collection Time: 04/19/14  8:07 AM  Result Value Ref Range   Glucose-Capillary 150 (H) 70 - 99 mg/dL  Troponin I (q 6hr x 3)     Status: None   Collection Time: 04/19/14  8:11 AM  Result Value Ref Range   Troponin I <0.30 <0.30 ng/mL    Comment:        Due to the release kinetics of cTnI, a negative result within  the first hours of the onset of symptoms does not rule out myocardial infarction with certainty. If myocardial infarction is still suspected, repeat the test at appropriate intervals.   Magnesium     Status: None   Collection Time: 04/19/14  8:11 AM  Result Value Ref Range   Magnesium 2.2 1.5 - 2.5 mg/dL  Phosphorus     Status: None   Collection Time: 04/19/14  8:11 AM  Result Value Ref Range   Phosphorus 2.9 2.3 - 4.6 mg/dL  Type and screen     Status: None   Collection Time: 04/19/14  8:11 AM  Result Value Ref Range   ABO/RH(D) O POS    Antibody Screen NEG    Sample Expiration 04/22/2014   ABO/Rh     Status: None   Collection Time: 04/19/14  8:11 AM  Result Value Ref Range   ABO/RH(D) O POS    Constitutional: negative for chills, fatigue, fevers and sweats Ears, nose, mouth, throat, and face: negative for earaches, hoarseness, nasal congestion and sore throat Respiratory: negative for cough, dyspnea on exertion, hemoptysis and sputum Cardiovascular: negative for chest pain, chest pressure/discomfort, dyspnea, orthopnea and palpitations Gastrointestinal: negative for abdominal pain, change in bowel habits, nausea and vomiting Genitourinary:negative for dysuria Musculoskeletal:negative for arthralgias, back pain, myalgias and neck pain Neurological: negative for dizziness, headaches, paresthesia, speech problems and weakness  Physical Exam: Filed Vitals:   04/19/14 0629  BP: 74/45  Pulse: 90  Temp:   Resp:      General appearance: alert, cooperative and no distress Head: Normocephalic, without obvious abnormality, atraumatic Neck: no adenopathy, no carotid bruit, no JVD and supple, symmetrical, trachea midline Resp: clear to auscultation bilaterally Cardio: regular rate and rhythm, S1, S2 normal, no murmur, click, rub or gallop GI: soft, non-tender; bowel sounds normal; no masses,  no organomegaly Extremities: no edema, dark  necrotic left 4th toe with malodorous  sanguinous drainage Neurologic: Grossly normal Dialysis Access: AVF @ LFA with + bruit   Assessment/Plan: 1. L 4th toe gangrene - x-ray 12/7 suggests osteomyelitis; 4th & 5th ray amputation planned tomorrow per Dr. Sharol Given. 2. ESRD - HD on TTS @ [facility], K 4.8.  HD pending. 3. Hypotension/volume - BP chronically low, now 74/45, on Midodrine 5 mg tid; wt 49 kg, requires as much as 1 L to sustain BP during HD. 4. Anemia - Hgb down to 9.2, Aranesp 25 mcg q2wk (starting 12/10),Venofer 100 mg x 5 (through 12/10). 5. Metabolic bone disease - Ca 8.5, P 2.9, last iPTH 153; Hectorol 1 mcg, no binders. 6. Nutrition - Last Alb 3.8, renal diet, vitamin. 7. DM - insulin per primary. 8. HIV - on antivirals  LYLES,CHARLES 04/19/2014, 11:19 AM   Attending Nephrologist: Erling Cruz, MD Pt with ESRD TTS at [facility] admitted for gangrene of toe for surgical intervention. He has multiple comorbidities as articulated in the above note.  We will support with dialysis. Aubrey Blackard C

## 2014-04-19 NOTE — Progress Notes (Signed)
Pt's bp=75/35 with map=44, MD notified who said he will be on the floor to see patient. Will continue to monitor pt.----Samyria Rudie, rn

## 2014-04-19 NOTE — Progress Notes (Signed)
Report given to V. Woodson HD RN. She will finish last 40 mins of pt treatment. So far, pt has tolerated HD. Last bp was 125/57. Pulse 73. Alert, oriented and vss. We communicated through [facility] Interpretation language line.

## 2014-04-19 NOTE — Progress Notes (Signed)
To hemo via stretcher and hemo staff.

## 2014-04-19 NOTE — Consult Note (Signed)
Reason for Consult: Gangrene left foot fourth toe Referring Physician: Dr. Roselind Rily Alvin is an 43 y.o. Daniels.  HPI: Patient is a 43 year old gentleman end stage renal disease with severe peripheral vascular disease who presents with black dry gangrene left foot fourth toe. Dialysis on Tuesday.  Past Medical History  Diagnosis Date  . Diabetes mellitus without complication   . Renal disorder     No past surgical history on file.  No family history on file.  Social History:  reports that he has never smoked. He does not have any smokeless tobacco history on file. He reports that he does not drink alcohol or use illicit drugs.  Allergies: No Known Allergies  Medications: I have reviewed the patient's current medications.  Results for orders placed or performed during the hospital encounter of 04/18/14 (from the past 48 hour(s))  CBC with Differential     Status: Abnormal   Collection Time: 04/18/14  1:00 PM  Result Value Ref Range   WBC 12.9 (H) 4.0 - 10.5 K/uL   RBC 3.06 (L) 4.22 - 5.81 MIL/uL   Hemoglobin 10.2 (L) 13.0 - 17.0 g/dL   HCT 30.0 (L) 39.0 - 52.0 %   MCV 98.0 78.0 - 100.0 fL   MCH 33.3 26.0 - 34.0 pg   MCHC 34.0 30.0 - 36.0 g/dL   RDW 14.5 11.5 - 15.5 %   Platelets 207 150 - 400 K/uL   Neutrophils Relative % 82 (H) 43 - 77 %   Neutro Abs 10.7 (H) 1.7 - 7.7 K/uL   Lymphocytes Relative 11 (L) 12 - 46 %   Lymphs Abs 1.4 0.7 - 4.0 K/uL   Monocytes Relative 6 3 - 12 %   Monocytes Absolute 0.7 0.1 - 1.0 K/uL   Eosinophils Relative 1 0 - 5 %   Eosinophils Absolute 0.1 0.0 - 0.7 K/uL   Basophils Relative 0 0 - 1 %   Basophils Absolute 0.0 0.0 - 0.1 K/uL  Comprehensive metabolic panel     Status: Abnormal   Collection Time: 04/18/14  1:00 PM  Result Value Ref Range   Sodium 132 (L) 137 - 147 mEq/L   Potassium 4.5 3.7 - 5.3 mEq/L   Chloride 90 (L) 96 - 112 mEq/L   CO2 24 19 - 32 mEq/L   Glucose, Bld 158 (H) 70 - 99 mg/dL   BUN 36 (H) 6 - 23 mg/dL    Creatinine, Ser 9.56 (H) 0.50 - 1.35 mg/dL   Calcium 9.1 8.4 - 10.5 mg/dL   Total Protein 8.6 (H) 6.0 - 8.3 g/dL   Albumin 3.0 (L) 3.5 - 5.2 g/dL   AST 27 0 - 37 U/L    Comment: HEMOLYSIS AT THIS LEVEL MAY AFFECT RESULT   ALT 17 0 - 53 U/L   Alkaline Phosphatase 77 39 - 117 U/L   Total Bilirubin 0.5 0.3 - 1.2 mg/dL   GFR calc non Af Amer 6 (L) >90 mL/min   GFR calc Af Amer 7 (L) >90 mL/min    Comment: (NOTE) The eGFR has been calculated using the CKD EPI equation. This calculation has not been validated in all clinical situations. eGFR's persistently <90 mL/min signify possible Chronic Kidney Disease.    Anion gap 18 (H) 5 - 15  Hemoglobin A1c     Status: Abnormal   Collection Time: 04/18/14  5:15 PM  Result Value Ref Range   Hgb A1c MFr Bld 6.0 (H) <5.7 %    Comment: (  NOTE)                                                                       According to the ADA Clinical Practice Recommendations for 2011, when HbA1c is used as a screening test:  >=6.5%   Diagnostic of Diabetes Mellitus           (if abnormal result is confirmed) 5.7-6.4%   Increased risk of developing Diabetes Mellitus References:Diagnosis and Classification of Diabetes Mellitus,Diabetes LSLH,7342,87(GOTLX 1):S62-S69 and Standards of Medical Care in         Diabetes - 2011,Diabetes BWIO,0355,97 (Suppl 1):S11-S61.    Mean Plasma Glucose 126 (H) <117 mg/dL    Comment: Performed at [facility]  Sedimentation rate     Status: Abnormal   Collection Time: 04/18/14  5:15 PM  Result Value Ref Range   Sed Rate 104 (H) 0 - 16 mm/hr  C-reactive protein     Status: Abnormal   Collection Time: 04/18/14  5:15 PM  Result Value Ref Range   CRP 17.8 (H) <0.60 mg/dL    Comment: Performed at [facility]  CBC     Status: Abnormal   Collection Time: 04/18/14  5:15 PM  Result Value Ref Range   WBC 13.9 (H) 4.0 - 10.5 K/uL   RBC 3.00 (L) 4.22 - 5.81 MIL/uL   Hemoglobin 9.6 (L) 13.0 - 17.0 g/dL   HCT  28.8 (L) 39.0 - 52.0 %   MCV 96.0 78.0 - 100.0 fL   MCH 32.0 26.0 - 34.0 pg   MCHC 33.3 30.0 - 36.0 g/dL   RDW 14.4 11.5 - 15.5 %   Platelets 208 150 - 400 K/uL  Creatinine, serum     Status: Abnormal   Collection Time: 04/18/14  5:15 PM  Result Value Ref Range   Creatinine, Ser 10.11 (H) 0.50 - 1.35 mg/dL   GFR calc non Af Amer 6 (L) >90 mL/min   GFR calc Af Amer 6 (L) >90 mL/min    Comment: (NOTE) The eGFR has been calculated using the CKD EPI equation. This calculation has not been validated in all clinical situations. eGFR's persistently <90 mL/min signify possible Chronic Kidney Disease.   Glucose, capillary     Status: Abnormal   Collection Time: 04/18/14  5:35 PM  Result Value Ref Range   Glucose-Capillary 67 (L) 70 - 99 mg/dL  Glucose, capillary     Status: None   Collection Time: 04/18/14  6:25 PM  Result Value Ref Range   Glucose-Capillary 98 70 - 99 mg/dL  Glucose, capillary     Status: Abnormal   Collection Time: 04/18/14  8:21 PM  Result Value Ref Range   Glucose-Capillary 120 (H) 70 - 99 mg/dL  Lactic acid, plasma     Status: None   Collection Time: 04/18/14 10:01 PM  Result Value Ref Range   Lactic Acid, Venous 0.7 0.5 - 2.2 mmol/L  Glucose, capillary     Status: Abnormal   Collection Time: 04/18/14 10:01 PM  Result Value Ref Range   Glucose-Capillary 173 (H) 70 - 99 mg/dL  Basic metabolic panel     Status: Abnormal   Collection Time: 04/19/14  4:09 AM  Result Value Ref Range  Sodium 133 (L) 137 - 147 mEq/L   Potassium 4.8 3.7 - 5.3 mEq/L   Chloride 96 96 - 112 mEq/L   CO2 20 19 - 32 mEq/L   Glucose, Bld 138 (H) 70 - 99 mg/dL   BUN 41 (H) 6 - 23 mg/dL   Creatinine, Ser 10.60 (H) 0.50 - 1.35 mg/dL   Calcium 8.5 8.4 - 10.5 mg/dL   GFR calc non Af Amer 5 (L) >90 mL/min   GFR calc Af Amer 6 (L) >90 mL/min    Comment: (NOTE) The eGFR has been calculated using the CKD EPI equation. This calculation has not been validated in all clinical  situations. eGFR's persistently <90 mL/min signify possible Chronic Kidney Disease.    Anion gap 17 (H) 5 - 15  CBC     Status: Abnormal   Collection Time: 04/19/14  4:09 AM  Result Value Ref Range   WBC 14.3 (H) 4.0 - 10.5 K/uL   RBC 2.92 (L) 4.22 - 5.81 MIL/uL   Hemoglobin 9.2 (L) 13.0 - 17.0 g/dL   HCT 28.3 (L) 39.0 - 52.0 %   MCV 96.9 78.0 - 100.0 fL   MCH 31.5 26.0 - 34.0 pg   MCHC 32.5 30.0 - 36.0 g/dL   RDW 14.4 11.5 - 15.5 %   Platelets 192 150 - 400 K/uL  Lactic acid, plasma     Status: None   Collection Time: 04/19/14  6:30 AM  Result Value Ref Range   Lactic Acid, Venous 0.6 0.5 - 2.2 mmol/L    Dg Foot Complete Left  04/18/2014   CLINICAL DATA:  Left fourth toe necrosis  EXAM: LEFT FOOT - COMPLETE 3+ VIEW  COMPARISON:  None.  FINDINGS: There is considerable air the soft tissues surrounding the fourth digit particularly adjacent to the proximal phalanx. There are changes suggestive of prior fracture with some impaction and bony resorption consistent with osteomyelitis. These changes are most noted in the lateral aspect of the fourth proximal phalanx and middle phalanx. Mild vascular calcifications are noted. No other acute bony abnormality is seen.  IMPRESSION: Subcutaneous air in the fourth digit with changes of prior fracture of the fourth proximal phalanx with resorption in the fourth proximal and middle phalanges. These changes are consistent with osteomyelitis.   Electronically Signed   By: Inez Catalina M.D.   On: 04/18/2014 16:22    Review of Systems  All other systems reviewed and are negative.  Blood pressure 74/45, pulse 90, temperature 99.8 F (37.7 C), temperature source Oral, resp. rate 20, weight 49 kg (108 lb 0.4 oz), SpO2 99 %. Physical Exam On examination patient has thin atrophic skin in both lower extremities. He has venous stasis healed ulcers in both legs but no open ulcers at this time. Right foot and leg has no ulcers. Examination the left foot he does  have a palpable dorsalis pedis pulse and has dry gangrenous changes of the fourth toe Assessment/Plan: Assessment severe peripheral vascular disease with dry gangrene of the fourth toe with a palpable dorsalis pedis pulse left foot.  Plan: We'll plan for fourth and possible fifth ray amputation left foot. Risks and benefits were discussed including the risk of the wound not healing. Patient was seen at bedside with an interpreter on the phone. Asian has no further questions. Plan for surgery on Wednesday.  Corrie Brannen V 04/19/2014, 8:09 AM

## 2014-04-19 NOTE — Consult Note (Addendum)
CARDIOLOGY CONSULT NOTE      Patient ID: Alvin Daniels MRN: [id number] DOB/AGE: [birth year] 43 y.o.  Admit date: 04/18/2014 Referring PhysicianJeffrey Babs Sciara, MD Primary PhysicianNo primary care provider on file. Primary Cardiologist Jashira Cotugno-new Reason for Consultation: ? ventricular tachycardia  HPI: 43 y/o who has PAD who has a toe infection requiring amputation.  On telemetry monitoring, he was noted to have nonsustained ventruclar tachycardia.  The patient was asymptomatic. No palpitations. No chest pain or shortness of breath. He has felt fine. He denies any leg swelling. He is scheduled to have his toe amputation tomorrow.  Review of systems complete and found to be negative unless listed above   Past Medical History  Diagnosis Date  . Diabetes mellitus without complication   . Renal disorder     No family history on file.  History   Social History  . Marital Status: Single    Spouse Name: N/A    Number of Children: N/A  . Years of Education: N/A   Occupational History  . Not on file.   Social History Main Topics  . Smoking status: Never Smoker   . Smokeless tobacco: Not on file  . Alcohol Use: No  . Drug Use: No  . Sexual Activity: Not on file   Other Topics Concern  . Not on file   Social History Narrative  . No narrative on file    No past surgical history on file.   Prescriptions prior to admission  Medication Sig Dispense Refill Last Dose  . Darunavir Ethanolate (PREZISTA) 800 MG tablet Take 800 mg by mouth daily with breakfast.   04/17/2014 at Unknown time  . emtricitabine (EMTRIVA) 200 MG capsule Take 200 mg by mouth See admin instructions. *takes every Tuesday and Saturday following dialysis*   04/16/2014  . omeprazole (PRILOSEC) 40 MG capsule Take 40 mg by mouth daily.   04/17/2014 at Unknown time  . ritonavir (NORVIR) 100 MG TABS tablet Take 100 mg by mouth daily with breakfast.   04/17/2014 at Unknown time  . tenofovir (VIREAD) 300 MG  tablet Take 300 mg by mouth every Sunday.   04/17/2014 at Unknown time    Physical Exam: Vitals:   Filed Vitals:   04/19/14 0400 04/19/14 0530 04/19/14 0600 04/19/14 0629  BP: 79/41 75/35 81/45  74/45  Pulse: 88 83 87 90  Temp: 99.8 F (37.7 C)     TempSrc: Oral     Resp: 20     Weight:      SpO2: 98% 97% 99% 99%   I&O's:   Intake/Output Summary (Last 24 hours) at 04/19/14 1027 Last data filed at 04/18/14 1700  Gross per 24 hour  Intake    240 ml  Output      0 ml  Net    240 ml   Physical exam:  Pace/AT EOMI No JVD, No carotid bruit RRR S1S2  No wheezing Soft. NT, nondistended No edema. Stockings on both lower extremities. No focal motor or sensory deficits Normal affect  Labs:   Lab Results  Component Value Date   WBC 14.3* 04/19/2014   HGB 9.2* 04/19/2014   HCT 28.3* 04/19/2014   MCV 96.9 04/19/2014   PLT 192 04/19/2014    Recent Labs Lab 04/18/14 1300  04/19/14 0409  NA 132*  --  133*  K 4.5  --  4.8  CL 90*  --  96  CO2 24  --  20  BUN 36*  --  41*  CREATININE  9.56*  < > 10.60*  CALCIUM 9.1  --  8.5  PROT 8.6*  --   --   BILITOT 0.5  --   --   ALKPHOS 77  --   --   ALT 17  --   --   AST 27  --   --   GLUCOSE 158*  --  138*  < > = values in this interval not displayed. Lab Results  Component Value Date   TROPONINI <0.30 04/19/2014   No results found for: CHOL No results found for: HDL No results found for: LDLCALC No results found for: TRIG No results found for: CHOLHDL No results found for: LDLDIRECT    Radiology: Evidence of osteomyelitis EKG: Normal  ASSESSMENT AND PLAN:  Active Problems:   Toe infection   Diabetic foot infection   End stage renal failure on dialysis   HIV disease   Hep C w/o coma, chronic  I personally reviewed the telemetry monitoring which was thought to be abnormal. What was called ventricular tachycardia/ventricular fibrillation appears to be artifact. The other lead that is visible will show normal QRS  complexes which are evenly space. The other lead may show artifact which could looked more like a ventricular arrhythmia. Even in those segments, normal QRSs well marked out. He has no cardiac symptoms. I would not plan any further cardiac workup at this time. He will have his surgery tomorrow. He does have risk factors for cardiac disease including renal failure requiring dialysis. However, given his lack of symptoms, would not pursue workup at this time.  We'll sign off. Please call back with questions.   Signed:   Mina Marble, MD, [facility] 04/19/2014, 10:27 AM

## 2014-04-19 NOTE — Progress Notes (Addendum)
Pt did have some V-tach sometime in the morning per the strip, Md on call was made later, EKG was order which was NSR, and labs were ordered as well. Will continue to monitor pt.-------Zyren Sevigny, rn

## 2014-04-19 NOTE — Progress Notes (Signed)
Inpatient Diabetes Program Recommendations  AACE/ADA: New Consensus Statement on Inpatient Glycemic Control (2013)  Target Ranges:  Prepandial:   less than 140 mg/dL      Peak postprandial:   less than 180 mg/dL (1-2 hours)      Critically ill patients:  140 - 180 mg/dL   Reason for Visit: Diabetes Consult  Diabetes history: None Outpatient Diabetes medications: None Current orders for Inpatient glycemic control: Novolog sensitive tidwc and hs  Inpatient Diabetes Program Recommendations Correction (SSI): Continue with Novolog sensitive tidwc and hs HgbA1C: 6.0% - pre-diabetes Diet: CHO mod diet  Note: Will continue to follow while inpatient. Thank you. Lorenda Peck, RD, LDN, CDE Inpatient Diabetes Coordinator [phone]

## 2014-04-19 NOTE — Procedures (Signed)
Tolerating hemodialysis without hemodynamic instability. Alvin Daniels C

## 2014-04-19 NOTE — Progress Notes (Signed)
INFECTIOUS DISEASE PROGRESS NOTE  ID: Alvin Daniels is a 43 y.o. male with  Active Problems:   Toe infection   Diabetic foot infection   End stage renal failure on dialysis   HIV disease   Hep C w/o coma, chronic  Subjective: 43 yo M with DM, ESRD, Hep C and HIV+. Was in hospital 12-4 to 04-22-13 due to repeat episode of hypotension, falls. He was started on midodrine. Was previously on DRVr/TFV/EMT. He also has a hx of a RLE wound.  He came to ED on 12-7 with black L 4th toe for 1 week. He then developed purulent d/c and worsening pain on day prior to admissoin. He was started on vanco/zosyn then changed to vanco/cefepime/flagyl.  His hospital course has been notable for NSVT and hypotension.    Currently he is without complaints.  He is having nl BM, makes small amt of urine. No dysphgia.   Abtx:  Anti-infectives    Start     Dose/Rate Route Frequency Ordered Stop   04/24/14 1000  tenofovir (VIREAD) tablet 300 mg     300 mg Oral Every Sun 04/18/14 1626     04/19/14 2000  emtricitabine (EMTRIVA) capsule 200 mg     200 mg Oral Once per day on Tue Thu 04/18/14 1636     04/19/14 1800  ceFEPIme (MAXIPIME) 2 g in dextrose 5 % 50 mL IVPB     2 g100 mL/hr over 30 Minutes Intravenous Every T-Th-Sa (1800) 04/18/14 1644     04/19/14 1200  vancomycin (VANCOCIN) 500 mg in sodium chloride 0.9 % 100 mL IVPB     500 mg100 mL/hr over 60 Minutes Intravenous Every T-Th-Sa (Hemodialysis) 04/18/14 1327     04/19/14 0800  ritonavir (NORVIR) tablet 100 mg     100 mg Oral Daily with breakfast 04/18/14 1626     04/19/14 0800  Darunavir Ethanolate (PREZISTA) tablet 800 mg     800 mg Oral Daily with breakfast 04/18/14 1626     04/18/14 1700  metroNIDAZOLE (FLAGYL) IVPB 500 mg     500 mg100 mL/hr over 60 Minutes Intravenous Every 8 hours 04/18/14 1644     04/18/14 1645  ceFEPIme (MAXIPIME) 2 g in dextrose 5 % 50 mL IVPB     2 g100 mL/hr over 30 Minutes Intravenous  Once 04/18/14 1644 04/18/14  1819   04/18/14 1626  emtricitabine (EMTRIVA) capsule 200 mg  Status:  Discontinued     200 mg Oral See admin instructions 04/18/14 1626 04/18/14 1633   04/18/14 1400  piperacillin-tazobactam (ZOSYN) IVPB 2.25 g  Status:  Discontinued     2.25 g100 mL/hr over 30 Minutes Intravenous 3 times per day 04/18/14 1327 04/18/14 1626   04/18/14 1330  vancomycin (VANCOCIN) IVPB 1000 mg/200 mL premix     1,000 mg200 mL/hr over 60 Minutes Intravenous STAT 04/18/14 1327 04/18/14 1520      Medications:  Scheduled: . ceFEPime (MAXIPIME) IV  2 g Intravenous Q T,Th,Sat-1800  . Darunavir Ethanolate  800 mg Oral Q breakfast  . emtricitabine  200 mg Oral Once per day on Tue Thu  . heparin  5,000 Units Subcutaneous 3 times per day  . insulin aspart  0-5 Units Subcutaneous QHS  . insulin aspart  0-9 Units Subcutaneous TID WC  . metronidazole  500 mg Intravenous Q8H  . midodrine  5 mg Oral TID WC  . pantoprazole  40 mg Oral Daily  . ritonavir  100 mg Oral Q breakfast  .  sodium chloride  3 mL Intravenous Q12H  . sodium chloride  3 mL Intravenous Q12H  . [START ON 04/24/2014] tenofovir  300 mg Oral Q Sun  . vancomycin  500 mg Intravenous Q T,Th,Sa-HD    Objective: Vital signs in last 24 hours: Temp:  [99.6 F (37.6 C)-101.5 F (38.6 C)] 99.8 F (37.7 C) (12/08 0400) Pulse Rate:  [83-106] 90 (12/08 0629) Resp:  [14-26] 20 (12/08 0400) BP: (62-241)/(33-133) 74/45 mmHg (12/08 0629) SpO2:  [96 %-100 %] 99 % (12/08 0629) Weight:  [49 kg (108 lb 0.4 oz)] 49 kg (108 lb 0.4 oz) (12/07 1322)   General appearance: alert, cooperative and no distress Throat: normal findings: oropharynx pink & moist without lesions or evidence of thrush Resp: clear to auscultation bilaterally Cardio: regular rate and rhythm GI: normal findings: bowel sounds normal and soft, non-tender Extremities: LUE AVG has + bruit. L 4th toe is black, loose and foul smelling. minimal tenderness.   Lab Results  Recent Labs   04/18/14 1300 04/18/14 1715 04/19/14 0409  WBC 12.9* 13.9* 14.3*  HGB 10.2* 9.6* 9.2*  HCT 30.0* 28.8* 28.3*  NA 132*  --  133*  K 4.5  --  4.8  CL 90*  --  96  CO2 24  --  20  BUN 36*  --  41*  CREATININE 9.56* 10.11* 10.60*   Liver Panel  Recent Labs  04/18/14 1300  PROT 8.6*  ALBUMIN 3.0*  AST 27  ALT 17  ALKPHOS 77  BILITOT 0.5   Sedimentation Rate  Recent Labs  04/18/14 1715  ESRSEDRATE 104*   C-Reactive Protein  Recent Labs  04/18/14 1715  CRP 17.8*    Microbiology: Recent Results (from the past 240 hour(s))  Wound culture     Status: None (Preliminary result)   Collection Time: 04/18/14 12:40 PM  Result Value Ref Range Status   Specimen Description WOUND LEFT TOE  Final   Special Requests NONE  Final   Gram Stain   Final    RARE WBC PRESENT, PREDOMINANTLY PMN NO SQUAMOUS EPITHELIAL CELLS SEEN FEW GRAM NEGATIVE RODS Performed at [facility]    Culture PENDING  Incomplete   Report Status PENDING  Incomplete  Blood culture (routine x 2)     Status: None (Preliminary result)   Collection Time: 04/18/14  1:00 PM  Result Value Ref Range Status   Specimen Description BLOOD RIGHT HAND  Final   Special Requests BOTTLES DRAWN AEROBIC AND ANAEROBIC 5CC  Final   Culture  Setup Time   Final    04/18/2014 18:24 Performed at [facility]    Culture   Final           BLOOD CULTURE RECEIVED NO GROWTH TO DATE CULTURE WILL BE HELD FOR 5 DAYS BEFORE ISSUING A FINAL NEGATIVE REPORT Performed at [facility]    Report Status PENDING  Incomplete  Blood culture (routine x 2)     Status: None (Preliminary result)   Collection Time: 04/18/14  1:05 PM  Result Value Ref Range Status   Specimen Description BLOOD RIGHT ANTECUBITAL  Final   Special Requests BOTTLES DRAWN AEROBIC AND ANAEROBIC 5CC  Final   Culture  Setup Time   Final    04/18/2014 18:23 Performed at [facility]    Culture   Final           BLOOD CULTURE RECEIVED  NO GROWTH TO DATE CULTURE WILL BE HELD FOR 5 DAYS  BEFORE ISSUING A FINAL NEGATIVE REPORT Performed at [facility]    Report Status PENDING  Incomplete    Studies/Results: Dg Foot Complete Left  04/18/2014   CLINICAL DATA:  Left fourth toe necrosis  EXAM: LEFT FOOT - COMPLETE 3+ VIEW  COMPARISON:  None.  FINDINGS: There is considerable air the soft tissues surrounding the fourth digit particularly adjacent to the proximal phalanx. There are changes suggestive of prior fracture with some impaction and bony resorption consistent with osteomyelitis. These changes are most noted in the lateral aspect of the fourth proximal phalanx and middle phalanx. Mild vascular calcifications are noted. No other acute bony abnormality is seen.  IMPRESSION: Subcutaneous air in the fourth digit with changes of prior fracture of the fourth proximal phalanx with resorption in the fourth proximal and middle phalanges. These changes are consistent with osteomyelitis.   Electronically Signed   By: Inez Catalina M.D.   On: 04/18/2014 16:22     Assessment/Plan: HIV (CD4 260 VL 59 10-15) Hep C (1a, Vl 688k 10-15) ESRD DM Osteomyelitis L foot NSVT Chronic Hypotension  Total days of antibiotics: 2 (vanco/flagyl/cefepime)  Would cont his current anbx Await surgical procedure Repeat CD4 and VL since his prev Vl was detectable.  Check elastogram to complete his Hep C staging for tx My great appreciation to [facility] and ortho for their excellent care  Comment- He had superficial wound Cx. These are notoriously unreliable and not recommended. Better to get a surgical Cx.           [name] Infectious Diseases (pager) [phone] [email or web address] 04/19/2014, 11:25 AM  LOS: 1 day

## 2014-04-19 NOTE — Discharge Summary (Signed)
[facility] Discharge Summary  Patient name: Alvin Daniels Medical record number: [id number] Date of birth: [birth year] Age: 43 y.o. Gender: male Date of Admission: 04/18/2014  Date of Discharge:04/25/2014 Admitting Physician: Alveda Reasons, MD  Primary Care Provider: Lorayne Marek, MD Consultants: Orthopedics, Wound  Indication for Hospitalization: Left 4th two osteomyelitis   Discharge Diagnoses/Problem List:  Osteomyelitis s/p amputation, hypotension, T2DM, HIV, chronic HCV, ESRD on dialysis, normocytic anemia  Disposition: Home with 24 hour supervision  Discharge Condition: Improved  Discharge Exam: Please see progress note for day of discharge.   Brief Hospital Course:  Alvin Daniels is a 43 y.o. male who presented with left 4th toe osteomyelisits. PMH is significant for PMH is significant for ESRD on dialysis, T2DM, chronic HCV, and HIV.   His hospital course, by problem, is outlined below:  #Left Foot Osteomyelitis. Patient presented with necrotic appearing left toe with osteomyelitis confirmed via plain film. Patient was started on vancomycin, cefepime, and flagyl and orthopedics was consulted. Patient met sepsis criteria on admission (HR, fever, and WBC) and was additionally hypotensive throughout his stay, but at his baseline (see problem below). Blood cultures showed no growth. Patient underwent amputation of the 3-5th rays on his left foot by orthopedics. He tolerated the procedure well and without complication. PT/OT was consulted post operatively and recommended 24hr supervision. The patient was able to be transitioned to PO levaquin without complication. He will be discharged home to finish his 30 day course of PO levaquin.  #Hypotension: Patient was hypotensive on the day of admission (70s/30s), requiring transfer to the step down unit. The patient's lactic acid was within normal range and he was at his baseline mental  status, making severe sepsis or septic shock unlikely. He was noted to have low BP at baseline, with several recent admissions having SBP in the 70s-90s. He was previously treated with midodrine, and we restarted this here. Otherwise, the patient continued to be at his baseline during this admission.   #HIV. Patient was seen by Dr [name] during this admission. While here his CD4 count was found to be 170 and he was started on daily Bactrim for PCP prophylaxis. He was additionally continued on his home anti-retroviral regimen of prezista, emtriva, norvir, and viread.    The patient's other chronic medical conditions, including T2DM, normocytic anemia, and ESRD on dialysis were stable during this admission and the patient was continued on his home medication and dialysis regimens.    Issues for Follow Up:  1) f/u wound healing in left 4th foot - patient will likely have difficult time with wound healing 2) f/u medication adherence - patient reported financial difficulty leading to him stopping his midodrine. He may benefit from a medication assistance program.  Significant Procedures: 12/9 - Left 3-5 toe and metatarsal amputation.  Significant Labs and Imaging:   Recent Labs Lab 04/22/14 0347 04/23/14 0319 04/24/14 0310  WBC 10.5 10.7* 10.2  HGB 8.9* 9.4* 9.5*  HCT 27.7* 29.0* 29.7*  PLT 249 331 332    Recent Labs Lab 04/19/14 0409 04/19/14 0811 04/20/14 0230 04/21/14 0256 04/23/14 0319  NA 133* 133* 140 132* 143  K 4.8 5.1 3.8 4.1 4.0  CL 96 95* 99 94* 101  CO2 _0 GLUCOSE 138* 142* 141* 184* 162*  BUN 41* 43* 14 28* 27*  CREATININE 10.60* 10.81* 4.73* 6.99* 6.62*  CALCIUM 8.5 8.5 8.0* 7.9* 8.3*  MG  --  2.2  --   --   --  PHOS  --  2.9  2.9  --   --   --   ALBUMIN  --  2.5*  --   --   --    Lactic acid 0.7 CRP 17.8 ESR 104  CD4 170 HIV RNA Quant 37  Results/Tests Pending at Time of Discharge: None  Discharge Medications:    Medication List     TAKE these medications        DSS 100 MG Caps  Take 100 mg by mouth 2 (two) times daily.     emtricitabine 200 MG capsule  Commonly known as:  EMTRIVA  Take 200 mg by mouth See admin instructions. *takes every Tuesday and Saturday following dialysis*     levofloxacin 500 MG tablet  Commonly known as:  LEVAQUIN  Take 1 tablet (500 mg total) by mouth every other day.     methocarbamol 500 MG tablet  Commonly known as:  ROBAXIN  Take 1 tablet (500 mg total) by mouth every 6 (six) hours as needed for muscle spasms.     midodrine 10 MG tablet  Commonly known as:  PROAMATINE  Take 1 tablet (10 mg total) by mouth 3 (three) times daily with meals.     omeprazole 40 MG capsule  Commonly known as:  PRILOSEC  Take 40 mg by mouth daily.     ondansetron 4 MG tablet  Commonly known as:  ZOFRAN  Take 1 tablet (4 mg total) by mouth every 6 (six) hours as needed for nausea.     oxyCODONE-acetaminophen 5-325 MG per tablet  Commonly known as:  PERCOCET/ROXICET  Take 1 tablet by mouth every 4 (four) hours as needed for moderate pain or severe pain.     polyethylene glycol packet  Commonly known as:  MIRALAX / GLYCOLAX  Take 17 g by mouth daily as needed for moderate constipation or severe constipation.     PREZISTA 800 MG tablet  Generic drug:  Darunavir Ethanolate  Take 800 mg by mouth daily with breakfast.     ritonavir 100 MG Tabs tablet  Commonly known as:  NORVIR  Take 100 mg by mouth daily with breakfast.     sulfamethoxazole-trimethoprim 400-80 MG per tablet  Commonly known as:  BACTRIM,SEPTRA  Take 1 tablet by mouth daily.     tenofovir 300 MG tablet  Commonly known as:  VIREAD  Take 300 mg by mouth every Sunday.        Discharge Instructions: Please refer to Patient Instructions section of EMR for full details.  Patient was counseled important signs and symptoms that should prompt return to medical care, changes in medications, dietary instructions, activity  restrictions, and follow up appointments.   Follow-Up Appointments: Follow-up Information    Follow up with DUDA,MARCUS V, MD In 1 week.   Specialty:  Orthopedic Surgery   Why:  Orthopedic surgeon   Contact information:   [place] [address] [phone]       Follow up with [name], MD. Schedule an appointment as soon as possible for a visit in 2 weeks.   Specialty:  Infectious Diseases   Contact information:   [address] [place] [address] [phone]       Follow up with Lorayne Marek, MD.   Specialty:  Internal Medicine   Why:  for follow-up in the next 2 weeks   Contact information:   [place] [address] [phone]  Dimas Chyle, MD 04/25/2014, 3:08 PM PGY-1, [place]

## 2014-04-20 ENCOUNTER — Encounter (HOSPITAL_COMMUNITY)
Admission: EM | Disposition: A | Discharge status: Home / Self Care | Payer: Self-pay | Source: Home / Self Care | Attending: Family Medicine

## 2014-04-20 ENCOUNTER — Inpatient Hospital Stay (HOSPITAL_COMMUNITY): Payer: Medicaid Other | Admitting: Anesthesiology

## 2014-04-20 ENCOUNTER — Encounter (HOSPITAL_COMMUNITY): Payer: Self-pay | Admitting: Anesthesiology

## 2014-04-20 ENCOUNTER — Inpatient Hospital Stay (HOSPITAL_COMMUNITY): Payer: Medicaid Other

## 2014-04-20 DIAGNOSIS — L089 Local infection of the skin and subcutaneous tissue, unspecified: Secondary | ICD-10-CM

## 2014-04-20 DIAGNOSIS — N186 End stage renal disease: Secondary | ICD-10-CM

## 2014-04-20 DIAGNOSIS — Z992 Dependence on renal dialysis: Secondary | ICD-10-CM

## 2014-04-20 DIAGNOSIS — E1169 Type 2 diabetes mellitus with other specified complication: Secondary | ICD-10-CM

## 2014-04-20 HISTORY — PX: AMPUTATION: SHX166

## 2014-04-20 LAB — T-HELPER CELLS (CD4) COUNT (NOT AT ARMC)
CD4 T CELL ABS: 170 /uL — AB (ref 400–2700)
CD4 T CELL HELPER: 8 % — AB (ref 33–55)

## 2014-04-20 LAB — CBC
HEMATOCRIT: 26.4 % — AB (ref 39.0–52.0)
Hemoglobin: 8.5 g/dL — ABNORMAL LOW (ref 13.0–17.0)
MCH: 31.5 pg (ref 26.0–34.0)
MCHC: 32.2 g/dL (ref 30.0–36.0)
MCV: 97.8 fL (ref 78.0–100.0)
Platelets: 178 10*3/uL (ref 150–400)
RBC: 2.7 MIL/uL — AB (ref 4.22–5.81)
RDW: 14.5 % (ref 11.5–15.5)
WBC: 12.1 10*3/uL — AB (ref 4.0–10.5)

## 2014-04-20 LAB — BASIC METABOLIC PANEL
Anion gap: 17 — ABNORMAL HIGH (ref 5–15)
BUN: 14 mg/dL (ref 6–23)
CALCIUM: 8 mg/dL — AB (ref 8.4–10.5)
CHLORIDE: 99 meq/L (ref 96–112)
CO2: 24 meq/L (ref 19–32)
Creatinine, Ser: 4.73 mg/dL — ABNORMAL HIGH (ref 0.50–1.35)
GFR calc Af Amer: 16 mL/min — ABNORMAL LOW (ref >90)
GFR calc non Af Amer: 14 mL/min — ABNORMAL LOW (ref >90)
Glucose, Bld: 141 mg/dL — ABNORMAL HIGH (ref 70–99)
Potassium: 3.8 mEq/L (ref 3.7–5.3)
Sodium: 140 mEq/L (ref 137–147)

## 2014-04-20 LAB — GLUCOSE, CAPILLARY
GLUCOSE-CAPILLARY: 121 mg/dL — AB (ref 70–99)
GLUCOSE-CAPILLARY: 124 mg/dL — AB (ref 70–99)
Glucose-Capillary: 164 mg/dL — ABNORMAL HIGH (ref 70–99)
Glucose-Capillary: 161 mg/dL — ABNORMAL HIGH (ref 70–99)
Glucose-Capillary: 129 mg/dL — ABNORMAL HIGH (ref 70–99)
Glucose-Capillary: 160 mg/dL — ABNORMAL HIGH (ref 70–99)

## 2014-04-20 LAB — HIV-1 RNA ULTRAQUANT REFLEX TO GENTYP+
HIV 1 RNA QUANT: 37 {copies}/mL — AB (ref <20)
HIV-1 RNA QUANT, LOG: 1.57 {Log} — AB (ref <1.30)

## 2014-04-20 SURGERY — AMPUTATION DIGIT
Anesthesia: General | Site: Foot | Laterality: Left

## 2014-04-20 MED ORDER — OXYCODONE-ACETAMINOPHEN 5-325 MG PO TABS
1.0000 | ORAL_TABLET | ORAL | Status: DC | PRN
  Administered 2014-04-21: 1 via ORAL
  Administered 2014-04-21 – 2014-04-22 (×3): 2 via ORAL
  Filled 2014-04-20 (×2): qty 2
  Filled 2014-04-20: qty 1
  Filled 2014-04-20: qty 2

## 2014-04-20 MED ORDER — ONDANSETRON HCL 4 MG/2ML IJ SOLN
INTRAMUSCULAR | Status: AC
  Filled 2014-04-20: qty 6

## 2014-04-20 MED ORDER — PROPOFOL 10 MG/ML IV BOLUS
INTRAVENOUS | Status: DC | PRN
  Administered 2014-04-20: 180 mg via INTRAVENOUS

## 2014-04-20 MED ORDER — PHENYLEPHRINE 40 MCG/ML (10ML) SYRINGE FOR IV PUSH (FOR BLOOD PRESSURE SUPPORT)
PREFILLED_SYRINGE | INTRAVENOUS | Status: AC
  Filled 2014-04-20: qty 20

## 2014-04-20 MED ORDER — FENTANYL CITRATE 0.05 MG/ML IJ SOLN
25.0000 ug | INTRAMUSCULAR | Status: DC | PRN
  Administered 2014-04-20 (×2): 25 ug via INTRAVENOUS

## 2014-04-20 MED ORDER — METHOCARBAMOL 1000 MG/10ML IJ SOLN
500.0000 mg | Freq: Four times a day (QID) | INTRAVENOUS | Status: DC | PRN
  Filled 2014-04-20: qty 5

## 2014-04-20 MED ORDER — METHOCARBAMOL 500 MG PO TABS
500.0000 mg | ORAL_TABLET | Freq: Four times a day (QID) | ORAL | Status: DC | PRN
  Administered 2014-04-22: 500 mg via ORAL
  Filled 2014-04-20 (×3): qty 1

## 2014-04-20 MED ORDER — CHLORHEXIDINE GLUCONATE 4 % EX LIQD
60.0000 mL | Freq: Once | CUTANEOUS | Status: AC
  Administered 2014-04-20: 4 via TOPICAL
  Filled 2014-04-20: qty 60

## 2014-04-20 MED ORDER — MIDAZOLAM HCL 5 MG/5ML IJ SOLN
INTRAMUSCULAR | Status: DC | PRN
Start: 2014-04-20 — End: 2014-04-20
  Administered 2014-04-20: 2 mg via INTRAVENOUS

## 2014-04-20 MED ORDER — ONDANSETRON HCL 4 MG/2ML IJ SOLN
INTRAMUSCULAR | Status: DC | PRN
  Administered 2014-04-20: 4 mg via INTRAVENOUS

## 2014-04-20 MED ORDER — FENTANYL CITRATE 0.05 MG/ML IJ SOLN
INTRAMUSCULAR | Status: AC
  Filled 2014-04-20: qty 5

## 2014-04-20 MED ORDER — PROPOFOL 10 MG/ML IV BOLUS
INTRAVENOUS | Status: AC
  Filled 2014-04-20: qty 20

## 2014-04-20 MED ORDER — FENTANYL CITRATE 0.05 MG/ML IJ SOLN
INTRAMUSCULAR | Status: AC
  Filled 2014-04-20: qty 2

## 2014-04-20 MED ORDER — PROMETHAZINE HCL 25 MG/ML IJ SOLN: 6.2500 mg | INTRAMUSCULAR | Status: DC | PRN

## 2014-04-20 MED ORDER — ONDANSETRON HCL 4 MG/2ML IJ SOLN
4.0000 mg | Freq: Four times a day (QID) | INTRAMUSCULAR | Status: DC | PRN
  Administered 2014-04-21 – 2014-04-22 (×4): 4 mg via INTRAVENOUS
  Filled 2014-04-20 (×4): qty 2

## 2014-04-20 MED ORDER — MIDAZOLAM HCL 2 MG/2ML IJ SOLN: 0.5000 mg | Freq: Once | INTRAMUSCULAR | Status: DC | PRN

## 2014-04-20 MED ORDER — MIDAZOLAM HCL 2 MG/2ML IJ SOLN
INTRAMUSCULAR | Status: AC
  Filled 2014-04-20: qty 2

## 2014-04-20 MED ORDER — PHENYLEPHRINE HCL 10 MG/ML IJ SOLN
INTRAMUSCULAR | Status: DC | PRN
  Administered 2014-04-20: 80 ug via INTRAVENOUS
  Administered 2014-04-20: 120 ug via INTRAVENOUS
  Administered 2014-04-20: 80 ug via INTRAVENOUS
  Administered 2014-04-20: 120 ug via INTRAVENOUS

## 2014-04-20 MED ORDER — MIDODRINE HCL 5 MG PO TABS
10.0000 mg | ORAL_TABLET | Freq: Three times a day (TID) | ORAL | Status: DC
  Administered 2014-04-20 – 2014-04-24 (×11): 10 mg via ORAL
  Filled 2014-04-20 (×15): qty 2

## 2014-04-20 MED ORDER — METOCLOPRAMIDE HCL 5 MG PO TABS
5.0000 mg | ORAL_TABLET | Freq: Three times a day (TID) | ORAL | Status: DC | PRN
  Filled 2014-04-20: qty 2

## 2014-04-20 MED ORDER — LIDOCAINE HCL (CARDIAC) 20 MG/ML IV SOLN
INTRAVENOUS | Status: DC | PRN
  Administered 2014-04-20: 80 mg via INTRAVENOUS

## 2014-04-20 MED ORDER — ONDANSETRON HCL 4 MG PO TABS
4.0000 mg | ORAL_TABLET | Freq: Four times a day (QID) | ORAL | Status: DC | PRN
  Administered 2014-04-23: 4 mg via ORAL
  Filled 2014-04-20: qty 1

## 2014-04-20 MED ORDER — DOCUSATE SODIUM 100 MG PO CAPS
100.0000 mg | ORAL_CAPSULE | Freq: Two times a day (BID) | ORAL | Status: DC
  Administered 2014-04-20 – 2014-04-24 (×8): 100 mg via ORAL
  Filled 2014-04-20 (×10): qty 1

## 2014-04-20 MED ORDER — HYDROMORPHONE HCL 1 MG/ML IJ SOLN
0.5000 mg | INTRAMUSCULAR | Status: DC | PRN
  Administered 2014-04-21: 1 mg via INTRAVENOUS
  Filled 2014-04-20: qty 1

## 2014-04-20 MED ORDER — 0.9 % SODIUM CHLORIDE (POUR BTL) OPTIME
TOPICAL | Status: DC | PRN
  Administered 2014-04-20: 1000 mL

## 2014-04-20 MED ORDER — METOCLOPRAMIDE HCL 5 MG/ML IJ SOLN
5.0000 mg | Freq: Three times a day (TID) | INTRAMUSCULAR | Status: DC | PRN
  Filled 2014-04-20: qty 2

## 2014-04-20 SURGICAL SUPPLY — 31 items
BNDG CMPR 9X4 STRL LF SNTH (GAUZE/BANDAGES/DRESSINGS)
BNDG COHESIVE 4X5 TAN STRL (GAUZE/BANDAGES/DRESSINGS) ×2 IMPLANT
BNDG COHESIVE 6X5 TAN STRL LF (GAUZE/BANDAGES/DRESSINGS) ×1 IMPLANT
BNDG ESMARK 4X9 LF (GAUZE/BANDAGES/DRESSINGS) IMPLANT
BNDG GAUZE ELAST 4 BULKY (GAUZE/BANDAGES/DRESSINGS) ×2 IMPLANT
COVER SURGICAL LIGHT HANDLE (MISCELLANEOUS) ×2 IMPLANT
DRAPE U-SHAPE 47X51 STRL (DRAPES) ×2 IMPLANT
DRSG ADAPTIC 3X8 NADH LF (GAUZE/BANDAGES/DRESSINGS) ×1 IMPLANT
DRSG PAD ABDOMINAL 8X10 ST (GAUZE/BANDAGES/DRESSINGS) ×2 IMPLANT
DURAPREP 26ML APPLICATOR (WOUND CARE) ×2 IMPLANT
ELECT REM PT RETURN 9FT ADLT (ELECTROSURGICAL) ×2
ELECTRODE REM PT RTRN 9FT ADLT (ELECTROSURGICAL) ×1 IMPLANT
GAUZE SPONGE 4X4 12PLY STRL (GAUZE/BANDAGES/DRESSINGS) IMPLANT
GLOVE BIOGEL PI IND STRL 9 (GLOVE) ×1 IMPLANT
GLOVE BIOGEL PI INDICATOR 9 (GLOVE) ×1
GLOVE SURG ORTHO 9.0 STRL STRW (GLOVE) ×2 IMPLANT
GOWN STRL REUS W/ TWL XL LVL3 (GOWN DISPOSABLE) ×2 IMPLANT
GOWN STRL REUS W/TWL XL LVL3 (GOWN DISPOSABLE) ×4
KIT BASIN OR (CUSTOM PROCEDURE TRAY) ×2 IMPLANT
KIT ROOM TURNOVER OR (KITS) ×2 IMPLANT
MANIFOLD NEPTUNE II (INSTRUMENTS) ×2 IMPLANT
NEEDLE 22X1 1/2 (OR ONLY) (NEEDLE) IMPLANT
NS IRRIG 1000ML POUR BTL (IV SOLUTION) ×2 IMPLANT
PACK ORTHO EXTREMITY (CUSTOM PROCEDURE TRAY) ×2 IMPLANT
PAD ARMBOARD 7.5X6 YLW CONV (MISCELLANEOUS) ×4 IMPLANT
SPONGE GAUZE 4X4 12PLY STER LF (GAUZE/BANDAGES/DRESSINGS) ×1 IMPLANT
SUCTION FRAZIER TIP 10 FR DISP (SUCTIONS) IMPLANT
SUT ETHILON 2 0 PSLX (SUTURE) ×2 IMPLANT
SYR CONTROL 10ML LL (SYRINGE) IMPLANT
TOWEL OR 17X24 6PK STRL BLUE (TOWEL DISPOSABLE) ×2 IMPLANT
TOWEL OR 17X26 10 PK STRL BLUE (TOWEL DISPOSABLE) ×2 IMPLANT

## 2014-04-20 NOTE — Interval H&P Note (Signed)
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 After consideration of risks, benefits and other options for treatment, the patient has consented to  Procedure(s): 4th Toe Amputation, Possible 5th Toe Amputation (Left) as a surgical intervention .  The patient's history has been reviewed, patient examined, no change in status, stable for surgery.  I have reviewed the patient's chart and labs.  Questions were answered to the patient's satisfaction.     [name]

## 2014-04-20 NOTE — Op Note (Signed)
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  SURGEON:  Newt Minion, MD  PHYSICIAN ASSISTANT:None ANESTHESIA:   General  PREOPERATIVE INDICATIONS:  Alvin Daniels is a  43 y.o. 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    The risks benefits and alternatives were discussed with the patient preoperatively including but not limited to the risks of infection, bleeding, nerve injury, cardiopulmonary complications, the need for revision surgery, among others, and the patient was willing to proceed.  OPERATIVE IMPLANTS: None  OPERATIVE FINDINGS: Abscess extended around the third fourth and fifth metatarsal heads. This required amputation through the base of the metatarsals 34 and 5.  OPERATIVE PROCEDURE: Patient was brought to the operating room and underwent a general anesthetic. After adequate levels of anesthesia were obtained agents left lower extremity was prepped using DuraPrep draped into a sterile field. An elliptical incision was made around the fourth toe a fourth ray amputation was performed. Visualization showed abscess necrotic tissue which extended around the third metatarsal head as well as the fifth metatarsal head. Decision was made to proceed with a fifth fourth and third ray amputation. After resections there was good bleeding healthy tissue no further purulence no further necrotic tissue. After irrigation the incision was closed using 2-0 nylon. The wounds was covered with a sterile compressive dressing. Patient was extubated taken to the PACU in stable condition would continue IV antibiotics for at least 3 days.

## 2014-04-20 NOTE — Transfer of Care (Signed)
Immediate Anesthesia Transfer of Care Note  Patient: Alvin Daniels  Procedure(s) Performed: Procedure(s): 3rd toe amputation, 4th Toe Amputation,  5th Toe Amputation (Left)  Patient Location: PACU  Anesthesia Type:General  Level of Consciousness: awake, alert , oriented and patient cooperative  Airway & Oxygen Therapy: Patient Spontanous Breathing and Patient connected to nasal cannula oxygen  Post-op Assessment: Report given to PACU RN and Post -op Vital signs reviewed and stable  Post vital signs: Reviewed and stable  Complications: No apparent anesthesia complications

## 2014-04-20 NOTE — Progress Notes (Signed)
Patient Name: Alvin Daniels Date of Encounter: 04/20/2014  Active Problems:   Toe infection   Diabetic foot infection   End stage renal failure on dialysis   HIV disease   Hep C w/o coma, chronic   Blood poisoning    Primary Cardiologist: Dr. Irish Lack  Patient Profile: 44 yo male w/ no prev cards issues, admitted w/ infection, for toe amputation 12/09. Cards seeing for ?VT.  SUBJECTIVE: No palpitations, no chest pain, had HD yesterday without difficulty. Family assisted with translation.  OBJECTIVE Filed Vitals:   04/19/14 2050 04/20/14 0018 04/20/14 0402 04/20/14 0827  BP: 94/59 77/43 94/56  77/47  Pulse: 99 89 92 94  Temp: 101.6 F (38.7 C) 98.5 F (36.9 C) 98.3 F (36.8 C) 98.5 F (36.9 C)  TempSrc: Oral Oral Oral Oral  Resp: 23 14 18 18   Weight:   123 lb 3.8 oz (55.9 kg)   SpO2: 99% 100% 100% 100%    Intake/Output Summary (Last 24 hours) at 04/20/14 1008 Last data filed at 04/19/14 2123  Gross per 24 hour  Intake      3 ml  Output      0 ml  Net      3 ml   Filed Weights   04/19/14 1454 04/19/14 1909 04/20/14 0402  Weight: 115 lb 1.3 oz (52.2 kg) 112 lb 14 oz (51.2 kg) 123 lb 3.8 oz (55.9 kg)    PHYSICAL EXAM General: Well developed, well nourished, male in no acute distress. Head: Normocephalic, atraumatic.  Neck: Supple without bruits, JVD not elevated. Lungs:  Resp regular and unlabored, rales bases. Heart: RRR, S1, S2, no S3, S4, or murmur; no rub. Abdomen: Soft, non-tender, non-distended, BS + x 4.  Extremities: No clubbing, cyanosis, no edema.  Neuro: Alert and oriented X 3. Moves all extremities spontaneously. Psych: Normal affect.  LABS: CBC: Recent Labs  04/18/14 1300  04/19/14 0811 04/20/14 0230  WBC 12.9*  < > 15.2* 12.1*  NEUTROABS 10.7*  --   --   --   HGB 10.2*  < > 11.0* 8.5*  HCT 30.0*  < > 33.4* 26.4*  MCV 98.0  < > 97.4 97.8  PLT 207  < > 197 178  < > = values in this interval not displayed. INR:No results  for input(s): INR in the last 72 hours. Basic Metabolic Panel: Recent Labs  04/19/14 0811 04/20/14 0230  NA 133* 140  K 5.1 3.8  CL 95* 99  CO2 19 24  GLUCOSE 142* 141*  BUN 43* 14  CREATININE 10.81* 4.73*  CALCIUM 8.5 8.0*  MG 2.2  --   PHOS 2.9  2.9  --    Liver Function Tests: Recent Labs  04/18/14 1300 04/19/14 0811  AST 27  --   ALT 17  --   ALKPHOS 77  --   BILITOT 0.5  --   PROT 8.6*  --   ALBUMIN 3.0* 2.5*   Cardiac Enzymes: Recent Labs  04/19/14 0811 04/19/14 1328  TROPONINI <0.30 <0.30   Hemoglobin A1C: Recent Labs  04/18/14 1715  HGBA1C 6.0*   TELE:  SR, no sig ectopy, no VT noted      Radiology/Studies: Dg Foot Complete Left  04/18/2014   CLINICAL DATA:  Left fourth toe necrosis  EXAM: LEFT FOOT - COMPLETE 3+ VIEW  COMPARISON:  None.  FINDINGS: There is considerable air the soft tissues surrounding the fourth digit particularly adjacent to the proximal phalanx. There are changes  suggestive of prior fracture with some impaction and bony resorption consistent with osteomyelitis. These changes are most noted in the lateral aspect of the fourth proximal phalanx and middle phalanx. Mild vascular calcifications are noted. No other acute bony abnormality is seen.  IMPRESSION: Subcutaneous air in the fourth digit with changes of prior fracture of the fourth proximal phalanx with resorption in the fourth proximal and middle phalanges. These changes are consistent with osteomyelitis.   Electronically Signed   By: Inez Catalina M.D.   On: 04/18/2014 16:22   US Abdomen Complete W/elastography  04/20/2014   CLINICAL DATA:  Chronic hepatitis-C without hepatic coma.  EXAM: ULTRASOUND ABDOMEN  ULTRASOUND HEPATIC ELASTOGRAPHY  TECHNIQUE: Sonography of the upper abdomen was performed. In addition, ultrasound elastography evaluation of the liver was performed. A region of interest was placed within the right lobe of the liver. Following application of a compressive  sonographic pulse, shear waves were detected in the adjacent hepatic tissue and the shear wave velocity was calculated. Multiple assessments were performed at the selected site. Median shear wave velocity is correlated to a Metavir fibrosis score.  COMPARISON:  None.  FINDINGS: ULTRASOUND ABDOMEN  Gallbladder: No gallstones or wall thickening visualized. No sonographic Murphy sign noted.  Common bile duct: Diameter: 12 mm. Mild dilatation of intrahepatic bile ducts also noted.  Liver: No focal lesion identified. Within normal limits in parenchymal echogenicity.  IVC: No abnormality visualized.  Pancreas: Mild dilatation of pancreatic duct seen in visualized portion of pancreatic body measuring 3 4 mm. Remainder pancreas not well visualized.  Spleen: Size and appearance within normal limits.  Right Kidney: Length: 9.9 cm. Mildly increased renal parenchymal echogenicity. Multiple shadowing renal calculi seen measuring less than 1 cm. No mass or hydronephrosis visualized.  Left Kidney: Length: 10.3 cm. Mildly increased renal parenchymal echogenicity. Multiple shadowing renal calculi seen measuring less than 1 cm. No mass or hydronephrosis visualized.  Abdominal aorta: No aneurysm visualized.  Other findings: None.  ULTRASOUND HEPATIC ELASTOGRAPHY  Device: Siemens Helix VTQ  Transducer 4V  Patient position: Left lateral decubitus  Number of measurements:  10  Hepatic Segment:  8  Median velocity:   1.49  m/sec  IQR: 0.17  IQR/Median velocity ratio 0.11  Corresponding Metavir fibrosis score:  F2 +some F3  Risk of fibrosis: Moderate  Limitations of exam: None  Pertinent findings noted on other imaging exams:  None  Please note that abnormal shear wave velocities may also be identified in clinical settings other than with hepatic fibrosis, such as: acute hepatitis, elevated right heart and central venous pressures including use of beta blockers, veno-occlusive disease (Budd-Chiari), infiltrative processes such as  mastocytosis/amyloidosis/infiltrative tumor, extrahepatic cholestasis, in the post-prandial state, and liver transplantation. Correlation with patient history, laboratory data, and clinical condition recommended.  IMPRESSION: No evidence of gallstones.  Diffuse biliary ductal dilatation and mild pancreatic ductal dilatation. Etiology not apparent by ultrasound. Recommend correlation with liver function tests, and consider abdomen MRI and MRCP without and with contrast for further evaluation.  Bilateral nephrolithiasis. Increased renal parenchymal echogenicity, consistent with medical renal as. No evidence hydronephrosis.  Median hepatic shear wave velocity is calculated at 1.49 m/sec.  Corresponding Metavir fibrosis score is F2 + some F3.  Risk of fibrosis is moderate.  Follow-up:  Additional testing appropriate   Electronically Signed   By: Earle Gell M.D.   On: 04/20/2014 08:04     Current Medications:  . ceFEPime (MAXIPIME) IV  2 g Intravenous Q T,Th,Sat-1800  .  chlorhexidine  60 mL Topical Once  . [START ON 04/26/2014] darbepoetin (ARANESP) injection - DIALYSIS  100 mcg Intravenous Q Tue-HD  . Darunavir Ethanolate  800 mg Oral Q breakfast  . doxercalciferol  1 mcg Intravenous Q T,Th,Sa-HD  . emtricitabine  200 mg Oral Once per day on Tue Sat  . feeding supplement (NEPRO CARB STEADY)  237 mL Oral BID BM  . [START ON 04/21/2014] ferric gluconate (FERRLECIT/NULECIT) IV  125 mg Intravenous Q T,Th,Sa-HD  . heparin  5,000 Units Subcutaneous 3 times per day  . insulin aspart  0-5 Units Subcutaneous QHS  . insulin aspart  0-9 Units Subcutaneous TID WC  . metronidazole  500 mg Intravenous Q8H  . midodrine  5 mg Oral TID WC  . pantoprazole  40 mg Oral Daily  . ritonavir  100 mg Oral Q breakfast  . sodium chloride  3 mL Intravenous Q12H  . sodium chloride  3 mL Intravenous Q12H  . [START ON 04/24/2014] tenofovir  300 mg Oral Q Sun  . vancomycin  500 mg Intravenous Q T,Th,Sa-HD      ASSESSMENT  AND PLAN: Possible VT - telemetry reviewed. No sig arrhythmia, no ischemic symptoms, no further workup needed prior to surgery. He is at acceptable risk for surgery. Will see again PRN.  Otherwise, per IM/Ortho Active Problems:   Toe infection   Diabetic foot infection   End stage renal failure on dialysis   HIV disease   Hep C w/o coma, chronic   Blood poisoning   Jonetta Speak , PA-C 10:08 AM 04/20/2014

## 2014-04-20 NOTE — Progress Notes (Signed)
Assessment: 1. L 4th toe gangrene - x-ray 12/7 suggests osteomyelitis; 4th & 5th ray amputation planned today per Dr. Sharol Given. 2. ESRD - HD on TTS @ [facility], K 4.8. HD in AM 3. Hypotension/volume - BP chronically low, on Midodrine 5 mg tid, requires as much as 1 L to sustain BP during HD. 4. DM - insulin per primary. 5. HIV - on antivirals  Subjective: Interval History: None. Tolerated dialysis yesterday.  Objective: Vital signs in last 24 hours: Temp:  [98.3 F (36.8 C)-101.6 F (38.7 C)] 98.5 F (36.9 C) (12/09 0827) Pulse Rate:  [61-99] 94 (12/09 0827) Resp:  [12-23] 18 (12/09 0827) BP: (71-125)/(43-69) 77/47 mmHg (12/09 0827) SpO2:  [95 %-100 %] 100 % (12/09 0827) Weight:  [51.2 kg (112 lb 14 oz)-55.9 kg (123 lb 3.8 oz)] 55.9 kg (123 lb 3.8 oz) (12/09 0402) Weight change: 3.2 kg (7 lb 0.9 oz)  Intake/Output from previous day: 12/08 0701 - 12/09 0700 In: 3 [I.V.:3] Out: 0  Intake/Output this shift:    General appearance: alert and cooperative; no change in exam  Lab Results:  Recent Labs  04/19/14 0811 04/20/14 0230  WBC 15.2* 12.1*  HGB 11.0* 8.5*  HCT 33.4* 26.4*  PLT 197 178   BMET:  Recent Labs  04/19/14 0811 04/20/14 0230  NA 133* 140  K 5.1 3.8  CL 95* 99  CO2 19 24  GLUCOSE 142* 141*  BUN 43* 14  CREATININE 10.81* 4.73*  CALCIUM 8.5 8.0*   No results for input(s): PTH in the last 72 hours. Iron Studies: No results for input(s): IRON, TIBC, TRANSFERRIN, FERRITIN in the last 72 hours. Studies/Results: Dg Foot Complete Left  04/18/2014   CLINICAL DATA:  Left fourth toe necrosis  EXAM: LEFT FOOT - COMPLETE 3+ VIEW  COMPARISON:  None.  FINDINGS: There is considerable air the soft tissues surrounding the fourth digit particularly adjacent to the proximal phalanx. There are changes suggestive of prior fracture with some impaction and bony resorption consistent with osteomyelitis. These changes are most noted in the lateral aspect of the fourth proximal phalanx  and middle phalanx. Mild vascular calcifications are noted. No other acute bony abnormality is seen.  IMPRESSION: Subcutaneous air in the fourth digit with changes of prior fracture of the fourth proximal phalanx with resorption in the fourth proximal and middle phalanges. These changes are consistent with osteomyelitis.   Electronically Signed   By: Inez Catalina M.D.   On: 04/18/2014 16:22   US Abdomen Complete W/elastography  04/20/2014   CLINICAL DATA:  Chronic hepatitis-C without hepatic coma.  EXAM: ULTRASOUND ABDOMEN  ULTRASOUND HEPATIC ELASTOGRAPHY  TECHNIQUE: Sonography of the upper abdomen was performed. In addition, ultrasound elastography evaluation of the liver was performed. A region of interest was placed within the right lobe of the liver. Following application of a compressive sonographic pulse, shear waves were detected in the adjacent hepatic tissue and the shear wave velocity was calculated. Multiple assessments were performed at the selected site. Median shear wave velocity is correlated to a Metavir fibrosis score.  COMPARISON:  None.  FINDINGS: ULTRASOUND ABDOMEN  Gallbladder: No gallstones or wall thickening visualized. No sonographic Murphy sign noted.  Common bile duct: Diameter: 12 mm. Mild dilatation of intrahepatic bile ducts also noted.  Liver: No focal lesion identified. Within normal limits in parenchymal echogenicity.  IVC: No abnormality visualized.  Pancreas: Mild dilatation of pancreatic duct seen in visualized portion of pancreatic body measuring 3 4 mm. Remainder pancreas not well  visualized.  Spleen: Size and appearance within normal limits.  Right Kidney: Length: 9.9 cm. Mildly increased renal parenchymal echogenicity. Multiple shadowing renal calculi seen measuring less than 1 cm. No mass or hydronephrosis visualized.  Left Kidney: Length: 10.3 cm. Mildly increased renal parenchymal echogenicity. Multiple shadowing renal calculi seen measuring less than 1 cm. No mass or  hydronephrosis visualized.  Abdominal aorta: No aneurysm visualized.  Other findings: None.  ULTRASOUND HEPATIC ELASTOGRAPHY  Device: Siemens Helix VTQ  Transducer 4V  Patient position: Left lateral decubitus  Number of measurements:  10  Hepatic Segment:  8  Median velocity:   1.49  m/sec  IQR: 0.17  IQR/Median velocity ratio 0.11  Corresponding Metavir fibrosis score:  F2 +some F3  Risk of fibrosis: Moderate  Limitations of exam: None  Pertinent findings noted on other imaging exams:  None  Please note that abnormal shear wave velocities may also be identified in clinical settings other than with hepatic fibrosis, such as: acute hepatitis, elevated right heart and central venous pressures including use of beta blockers, veno-occlusive disease (Budd-Chiari), infiltrative processes such as mastocytosis/amyloidosis/infiltrative tumor, extrahepatic cholestasis, in the post-prandial state, and liver transplantation. Correlation with patient history, laboratory data, and clinical condition recommended.  IMPRESSION: No evidence of gallstones.  Diffuse biliary ductal dilatation and mild pancreatic ductal dilatation. Etiology not apparent by ultrasound. Recommend correlation with liver function tests, and consider abdomen MRI and MRCP without and with contrast for further evaluation.  Bilateral nephrolithiasis. Increased renal parenchymal echogenicity, consistent with medical renal as. No evidence hydronephrosis.  Median hepatic shear wave velocity is calculated at 1.49 m/sec.  Corresponding Metavir fibrosis score is F2 + some F3.  Risk of fibrosis is moderate.  Follow-up:  Additional testing appropriate   Electronically Signed   By: Earle Gell M.D.   On: 04/20/2014 08:04   Scheduled: . ceFEPime (MAXIPIME) IV  2 g Intravenous Q T,Th,Sat-1800  . [START ON 04/26/2014] darbepoetin (ARANESP) injection - DIALYSIS  100 mcg Intravenous Q Tue-HD  . Darunavir Ethanolate  800 mg Oral Q breakfast  . doxercalciferol  1 mcg  Intravenous Q T,Th,Sa-HD  . emtricitabine  200 mg Oral Once per day on Tue Sat  . feeding supplement (NEPRO CARB STEADY)  237 mL Oral BID BM  . [START ON 04/21/2014] ferric gluconate (FERRLECIT/NULECIT) IV  125 mg Intravenous Q T,Th,Sa-HD  . heparin  5,000 Units Subcutaneous 3 times per day  . insulin aspart  0-5 Units Subcutaneous QHS  . insulin aspart  0-9 Units Subcutaneous TID WC  . metronidazole  500 mg Intravenous Q8H  . midodrine  10 mg Oral TID WC  . pantoprazole  40 mg Oral Daily  . ritonavir  100 mg Oral Q breakfast  . sodium chloride  3 mL Intravenous Q12H  . sodium chloride  3 mL Intravenous Q12H  . [START ON 04/24/2014] tenofovir  300 mg Oral Q Sun  . vancomycin  500 mg Intravenous Q T,Th,Sa-HD     LOS: 2 days   Enis Leatherwood C 04/20/2014,12:08 PM

## 2014-04-20 NOTE — Anesthesia Preprocedure Evaluation (Addendum)
Anesthesia Evaluation  Patient identified by MRN, date of birth, ID band Patient awake    Reviewed: Allergy & Precautions, H&P , Patient's Chart, lab work & pertinent test results, reviewed documented beta blocker date and time   History of Anesthesia Complications (+) history of anesthetic complications  Airway Mallampati: II  TM Distance: >3 FB Neck ROM: full    Dental   Pulmonary  breath sounds clear to auscultation        Cardiovascular Exercise Tolerance: Good Rhythm:regular Rate:Normal     Neuro/Psych negative psych ROS   GI/Hepatic negative GI ROS, (+) Hepatitis -  Endo/Other  diabetes  Renal/GU CRFRenal disease     Musculoskeletal   Abdominal   Peds  Hematology   Anesthesia Other Findings   Reproductive/Obstetrics                           Anesthesia Physical Anesthesia Plan  ASA: III  Anesthesia Plan: General LMA and General   Post-op Pain Management:    Induction: Intravenous  Airway Management Planned: LMA  Additional Equipment:   Intra-op Plan:   Post-operative Plan:   Informed Consent: I have reviewed the patients History and Physical, chart, labs and discussed the procedure including the risks, benefits and alternatives for the proposed anesthesia with the patient or authorized representative who has indicated his/her understanding and acceptance.   Dental Advisory Given and Dental advisory given  Plan Discussed with: CRNA, Surgeon and Anesthesiologist  Anesthesia Plan Comments:       Anesthesia Quick Evaluation

## 2014-04-20 NOTE — H&P (View-Only) (Signed)
Reason for Consult: Gangrene left foot fourth toe Referring Physician: Dr. Roselind Rily Alvin Daniels is an 43 y.o. male.  HPI: Patient is a 43 year old gentleman end stage renal disease with severe peripheral vascular disease who presents with black dry gangrene left foot fourth toe. Dialysis on Tuesday.  Past Medical History  Diagnosis Date  . Diabetes mellitus without complication   . Renal disorder     No past surgical history on file.  No family history on file.  Social History:  reports that he has never smoked. He does not have any smokeless tobacco history on file. He reports that he does not drink alcohol or use illicit drugs.  Allergies: No Known Allergies  Medications: I have reviewed the patient's current medications.  Results for orders placed or performed during the hospital encounter of 04/18/14 (from the past 48 hour(s))  CBC with Differential     Status: Abnormal   Collection Time: 04/18/14  1:00 PM  Result Value Ref Range   WBC 12.9 (H) 4.0 - 10.5 K/uL   RBC 3.06 (L) 4.22 - 5.81 MIL/uL   Hemoglobin 10.2 (L) 13.0 - 17.0 g/dL   HCT 30.0 (L) 39.0 - 52.0 %   MCV 98.0 78.0 - 100.0 fL   MCH 33.3 26.0 - 34.0 pg   MCHC 34.0 30.0 - 36.0 g/dL   RDW 14.5 11.5 - 15.5 %   Platelets 207 150 - 400 K/uL   Neutrophils Relative % 82 (H) 43 - 77 %   Neutro Abs 10.7 (H) 1.7 - 7.7 K/uL   Lymphocytes Relative 11 (L) 12 - 46 %   Lymphs Abs 1.4 0.7 - 4.0 K/uL   Monocytes Relative 6 3 - 12 %   Monocytes Absolute 0.7 0.1 - 1.0 K/uL   Eosinophils Relative 1 0 - 5 %   Eosinophils Absolute 0.1 0.0 - 0.7 K/uL   Basophils Relative 0 0 - 1 %   Basophils Absolute 0.0 0.0 - 0.1 K/uL  Comprehensive metabolic panel     Status: Abnormal   Collection Time: 04/18/14  1:00 PM  Result Value Ref Range   Sodium 132 (L) 137 - 147 mEq/L   Potassium 4.5 3.7 - 5.3 mEq/L   Chloride 90 (L) 96 - 112 mEq/L   CO2 24 19 - 32 mEq/L   Glucose, Bld 158 (H) 70 - 99 mg/dL   BUN 36 (H) 6 - 23 mg/dL    Creatinine, Ser 9.56 (H) 0.50 - 1.35 mg/dL   Calcium 9.1 8.4 - 10.5 mg/dL   Total Protein 8.6 (H) 6.0 - 8.3 g/dL   Albumin 3.0 (L) 3.5 - 5.2 g/dL   AST 27 0 - 37 U/L    Comment: HEMOLYSIS AT THIS LEVEL MAY AFFECT RESULT   ALT 17 0 - 53 U/L   Alkaline Phosphatase 77 39 - 117 U/L   Total Bilirubin 0.5 0.3 - 1.2 mg/dL   GFR calc non Af Amer 6 (L) >90 mL/min   GFR calc Af Amer 7 (L) >90 mL/min    Comment: (NOTE) The eGFR has been calculated using the CKD EPI equation. This calculation has not been validated in all clinical situations. eGFR's persistently <90 mL/min signify possible Chronic Kidney Disease.    Anion gap 18 (H) 5 - 15  Hemoglobin A1c     Status: Abnormal   Collection Time: 04/18/14  5:15 PM  Result Value Ref Range   Hgb A1c MFr Bld 6.0 (H) <5.7 %    Comment: (  NOTE)                                                                       According to the ADA Clinical Practice Recommendations for 2011, when HbA1c is used as a screening test:  >=6.5%   Diagnostic of Diabetes Mellitus           (if abnormal result is confirmed) 5.7-6.4%   Increased risk of developing Diabetes Mellitus References:Diagnosis and Classification of Diabetes Mellitus,Diabetes OHYW,7371,06(YIRSW 1):S62-S69 and Standards of Medical Care in         Diabetes - 2011,Diabetes NIOE,7035,00 (Suppl 1):S11-S61.    Mean Plasma Glucose 126 (H) <117 mg/dL    Comment: Performed at [facility]  Sedimentation rate     Status: Abnormal   Collection Time: 04/18/14  5:15 PM  Result Value Ref Range   Sed Rate 104 (H) 0 - 16 mm/hr  C-reactive protein     Status: Abnormal   Collection Time: 04/18/14  5:15 PM  Result Value Ref Range   CRP 17.8 (H) <0.60 mg/dL    Comment: Performed at [facility]  CBC     Status: Abnormal   Collection Time: 04/18/14  5:15 PM  Result Value Ref Range   WBC 13.9 (H) 4.0 - 10.5 K/uL   RBC 3.00 (L) 4.22 - 5.81 MIL/uL   Hemoglobin 9.6 (L) 13.0 - 17.0 g/dL   HCT  28.8 (L) 39.0 - 52.0 %   MCV 96.0 78.0 - 100.0 fL   MCH 32.0 26.0 - 34.0 pg   MCHC 33.3 30.0 - 36.0 g/dL   RDW 14.4 11.5 - 15.5 %   Platelets 208 150 - 400 K/uL  Creatinine, serum     Status: Abnormal   Collection Time: 04/18/14  5:15 PM  Result Value Ref Range   Creatinine, Ser 10.11 (H) 0.50 - 1.35 mg/dL   GFR calc non Af Amer 6 (L) >90 mL/min   GFR calc Af Amer 6 (L) >90 mL/min    Comment: (NOTE) The eGFR has been calculated using the CKD EPI equation. This calculation has not been validated in all clinical situations. eGFR's persistently <90 mL/min signify possible Chronic Kidney Disease.   Glucose, capillary     Status: Abnormal   Collection Time: 04/18/14  5:35 PM  Result Value Ref Range   Glucose-Capillary 67 (L) 70 - 99 mg/dL  Glucose, capillary     Status: None   Collection Time: 04/18/14  6:25 PM  Result Value Ref Range   Glucose-Capillary 98 70 - 99 mg/dL  Glucose, capillary     Status: Abnormal   Collection Time: 04/18/14  8:21 PM  Result Value Ref Range   Glucose-Capillary 120 (H) 70 - 99 mg/dL  Lactic acid, plasma     Status: None   Collection Time: 04/18/14 10:01 PM  Result Value Ref Range   Lactic Acid, Venous 0.7 0.5 - 2.2 mmol/L  Glucose, capillary     Status: Abnormal   Collection Time: 04/18/14 10:01 PM  Result Value Ref Range   Glucose-Capillary 173 (H) 70 - 99 mg/dL  Basic metabolic panel     Status: Abnormal   Collection Time: 04/19/14  4:09 AM  Result Value Ref Range  Sodium 133 (L) 137 - 147 mEq/L   Potassium 4.8 3.7 - 5.3 mEq/L   Chloride 96 96 - 112 mEq/L   CO2 20 19 - 32 mEq/L   Glucose, Bld 138 (H) 70 - 99 mg/dL   BUN 41 (H) 6 - 23 mg/dL   Creatinine, Ser 10.60 (H) 0.50 - 1.35 mg/dL   Calcium 8.5 8.4 - 10.5 mg/dL   GFR calc non Af Amer 5 (L) >90 mL/min   GFR calc Af Amer 6 (L) >90 mL/min    Comment: (NOTE) The eGFR has been calculated using the CKD EPI equation. This calculation has not been validated in all clinical  situations. eGFR's persistently <90 mL/min signify possible Chronic Kidney Disease.    Anion gap 17 (H) 5 - 15  CBC     Status: Abnormal   Collection Time: 04/19/14  4:09 AM  Result Value Ref Range   WBC 14.3 (H) 4.0 - 10.5 K/uL   RBC 2.92 (L) 4.22 - 5.81 MIL/uL   Hemoglobin 9.2 (L) 13.0 - 17.0 g/dL   HCT 28.3 (L) 39.0 - 52.0 %   MCV 96.9 78.0 - 100.0 fL   MCH 31.5 26.0 - 34.0 pg   MCHC 32.5 30.0 - 36.0 g/dL   RDW 14.4 11.5 - 15.5 %   Platelets 192 150 - 400 K/uL  Lactic acid, plasma     Status: None   Collection Time: 04/19/14  6:30 AM  Result Value Ref Range   Lactic Acid, Venous 0.6 0.5 - 2.2 mmol/L    Dg Foot Complete Left  04/18/2014   CLINICAL DATA:  Left fourth toe necrosis  EXAM: LEFT FOOT - COMPLETE 3+ VIEW  COMPARISON:  None.  FINDINGS: There is considerable air the soft tissues surrounding the fourth digit particularly adjacent to the proximal phalanx. There are changes suggestive of prior fracture with some impaction and bony resorption consistent with osteomyelitis. These changes are most noted in the lateral aspect of the fourth proximal phalanx and middle phalanx. Mild vascular calcifications are noted. No other acute bony abnormality is seen.  IMPRESSION: Subcutaneous air in the fourth digit with changes of prior fracture of the fourth proximal phalanx with resorption in the fourth proximal and middle phalanges. These changes are consistent with osteomyelitis.   Electronically Signed   By: Alvin Daniels M.D.   On: 04/18/2014 16:22    Review of Systems  All other systems reviewed and are negative.  Blood pressure 74/45, pulse 90, temperature 99.8 F (37.7 C), temperature source Oral, resp. rate 20, weight 49 kg (108 lb 0.4 oz), SpO2 99 %. Physical Exam On examination patient has thin atrophic skin in both lower extremities. He has venous stasis healed ulcers in both legs but no open ulcers at this time. Right foot and leg has no ulcers. Examination the left foot he does  have a palpable dorsalis pedis pulse and has dry gangrenous changes of the fourth toe Assessment/Plan: Assessment severe peripheral vascular disease with dry gangrene of the fourth toe with a palpable dorsalis pedis pulse left foot.  Plan: We'll plan for fourth and possible fifth ray amputation left foot. Risks and benefits were discussed including the risk of the wound not healing. Patient was seen at bedside with an interpreter on the phone. Alvin Daniels has no further questions. Plan for surgery on Wednesday.  Yavier Snider V 04/19/2014, 8:09 AM

## 2014-04-20 NOTE — Progress Notes (Signed)
Family Medicine Teaching Service Daily Progress Note Intern Pager: [phone]  Patient name: Alvin Daniels Medical record number: [id number] Date of birth: 1971/02/01 Age: 43 y.o. Gender: male  Primary Care Provider: No primary care provider on file. Consultants: Orthopedics, Wound Code Status: Full  Pt Overview and Major Events to Date:  12/7 - Admitted for left 4th toe osteomyelitis 12/7 - Transferred to step down unit due to persistent hypotension  Assessment and Plan: Alvin Daniels is a 43 y.o. male presenting with left 4th toe osteomyelisits. PMH is significant for PMH is significant for ESRD on dialysis, T2DM, chronic HCV, and HIV.   #Sepsis 2/2 left 4th toe osteomyelitis. Patient met sepsis criteria on admission (HR, fever, and WBC) . Patient additionally became persistently hypotensive on the day of admission, requiring transfer to the step down unit. On chart review, it appears that the patient has low BP at baseline, with several recent admissions having SBP in the 70s-90s. Patient does not have an elevated lactic acid, and he appears well on exam. Not likely in severe sepsis/septic shock less likely at this time. -s/p 1 dose of vanc and zosyn in ED -vanc (12/7- ), cefepime (12/7- ), flagyl (12/7- ) -Blood culture: No growth to date -Superficial wound culture: Multiple organisms, none predominant -morphine prn pain -Consulted orthopedics, appreciate assistance -To OR for amputation on 12/9 -f/u ABIs -Started midodrine 12/8 -Low threshold for consulting CCM if patient continues to be hypotensive  #Apparent V-tach. Patient experienced a 70+ beat run on vtach at 0112 on 12/8. Cardiology consulted, determined it to be artifact.  -Troponin negative -EKG: NSR -Mg, phos wnl  #Back Pain. Seems to be more in MSK in etiology. No alarm signs or symptoms concerning for epidural abscess, cauda equina syndrome, or other neurological impingement. No imaging indicated at  this time. -conservative management - tylenol prn -consider PT once decision made on potential intervention for his toe  #T2DM with polyneuropathy. Patient reports being on 70/30 insulin twice daily (20 in the morning, 35 at night), however on chart review (patient with 2 charts set for merging), patient appears to be on 75/25 15U twice daily. A1c 5.7 in June. -A1c 6.0 here -SSI  #Normocytic anemia. Hgb 10.2, MCV 98 on admission. Baseline HgB 11-13. Iron panel in 2014 not consistent with iron deficiency. Likely related to anemia of chronic disease. - Aranesp and venofer per renal -Continue to monitor  #ESRD on dialysis. On TTS schedule. Nephrology consulted, appreciate assistance -On home HD schedule -Hectorol   #HIV, chronic HCV. CD4 count 260 in October. Followed by Dr Alvin Daniels.  -Continue home prezista, emtriva, norvir, and viread -f/u CD4, viral load  FEN/GI: NPO for surgery, renal diet afterwards, Saline lock IV Prophylaxis: Subcutaneous heparin  Disposition: Admitted to step down pending above management.  Subjective:  No complaints this morning. Still complains of low back pain. Tramadol helped alleviate the pain last night. No fevers or chills. No chest pain or shortness of breath.  Objective: Temp:  [98.3 F (36.8 C)-101.6 F (38.7 C)] 98.3 F (36.8 C) (12/09 0402) Pulse Rate:  [61-99] 92 (12/09 0402) Resp:  [12-23] 18 (12/09 0402) BP: (71-125)/(43-69) 94/56 mmHg (12/09 0402) SpO2:  [95 %-100 %] 100 % (12/09 0402) Weight:  [112 lb 14 oz (51.2 kg)-123 lb 3.8 oz (55.9 kg)] 123 lb 3.8 oz (55.9 kg) (12/09 0402) Physical Exam: General: NAD, lying in hospital bed Cardiovascular: RRR, no murmurs appreciated Respiratory: NWOB, CTAB, no wheezes or crackles Abdomen: +BS, soft, NT, ND Extremities:  AVF on LUE with palpable thrill. Left 4th toe black and necrotic appearing with foul smelling discharge. DP pulses palpable bilaterally. Sclerotic changes of venous stasis evident  on dorsal aspects of feet bilaterally  Laboratory:  Recent Labs Lab 04/19/14 0409 04/19/14 0811 04/20/14 0230  WBC 14.3* 15.2* 12.1*  HGB 9.2* 11.0* 8.5*  HCT 28.3* 33.4* 26.4*  PLT 192 197 178    Recent Labs Lab 04/18/14 1300  04/19/14 0409 04/19/14 0811 04/20/14 0230  NA 132*  --  133* 133* 140  K 4.5  --  4.8 5.1 3.8  CL 90*  --  96 95* 99  CO2 24  --  20 19 24   BUN 36*  --  41* 43* 14  CREATININE 9.56*  < > 10.60* 10.81* 4.73*  CALCIUM 9.1  --  8.5 8.5 8.0*  PROT 8.6*  --   --   --   --   BILITOT 0.5  --   --   --   --   ALKPHOS 77  --   --   --   --   ALT 17  --   --   --   --   AST 27  --   --   --   --   GLUCOSE 158*  --  138* 142* 141*  < > = values in this interval not displayed. Lactic acid 0.7 CRP 17.8 ESR 104  Imaging/Diagnostic Tests: Dg Foot Complete Left  04/18/2014   CLINICAL DATA:  Left fourth toe necrosis  EXAM: LEFT FOOT - COMPLETE 3+ VIEW  COMPARISON:  None.  FINDINGS: There is considerable air the soft tissues surrounding the fourth digit particularly adjacent to the proximal phalanx. There are changes suggestive of prior fracture with some impaction and bony resorption consistent with osteomyelitis. These changes are most noted in the lateral aspect of the fourth proximal phalanx and middle phalanx. Mild vascular calcifications are noted. No other acute bony abnormality is seen.  IMPRESSION: Subcutaneous air in the fourth digit with changes of prior fracture of the fourth proximal phalanx with resorption in the fourth proximal and middle phalanges. These changes are consistent with osteomyelitis.   Electronically Signed   By: Inez Catalina M.D.   On: 04/18/2014 16:22   Dimas Chyle, MD 04/20/2014, 8:04 AM PGY-1, [place] Intern pager: [phone], text pages welcome

## 2014-04-20 NOTE — Progress Notes (Signed)
VASCULAR LAB PRELIMINARY  PRELIMINARY  PRELIMINARY  PRELIMINARY  VASCULAR LAB PRELIMINARY  ARTERIAL  ABI completed:    RIGHT    LEFT    PRESSURE WAVEFORM  PRESSURE WAVEFORM  BRACHIAL 70 Triphasic BRACHIAL Restricted Triphasic  DP 108 Triphasic DP 76 Triphasic  PT 76 Biphasic PT 75 Monophasic sounds Biphasic    RIGHT LEFT  ABI 1.54 1.09     Leea Rambeau, RVS 04/20/2014, 11:13 AM

## 2014-04-21 DIAGNOSIS — M869 Osteomyelitis, unspecified: Secondary | ICD-10-CM

## 2014-04-21 DIAGNOSIS — B1920 Unspecified viral hepatitis C without hepatic coma: Secondary | ICD-10-CM | POA: Insufficient documentation

## 2014-04-21 DIAGNOSIS — E1169 Type 2 diabetes mellitus with other specified complication: Secondary | ICD-10-CM | POA: Insufficient documentation

## 2014-04-21 LAB — BASIC METABOLIC PANEL WITH GFR
Anion gap: 16 — ABNORMAL HIGH (ref 5–15)
BUN: 28 mg/dL — ABNORMAL HIGH (ref 6–23)
CO2: 22 meq/L (ref 19–32)
Calcium: 7.9 mg/dL — ABNORMAL LOW (ref 8.4–10.5)
Chloride: 94 meq/L — ABNORMAL LOW (ref 96–112)
Creatinine, Ser: 6.99 mg/dL — ABNORMAL HIGH (ref 0.50–1.35)
GFR calc Af Amer: 10 mL/min — ABNORMAL LOW
GFR calc non Af Amer: 9 mL/min — ABNORMAL LOW
Glucose, Bld: 184 mg/dL — ABNORMAL HIGH (ref 70–99)
Potassium: 4.1 meq/L (ref 3.7–5.3)
Sodium: 132 meq/L — ABNORMAL LOW (ref 137–147)

## 2014-04-21 LAB — CBC
HCT: 27.5 % — ABNORMAL LOW (ref 39.0–52.0)
Hemoglobin: 8.7 g/dL — ABNORMAL LOW (ref 13.0–17.0)
MCH: 31.1 pg (ref 26.0–34.0)
MCHC: 31.6 g/dL (ref 30.0–36.0)
MCV: 98.2 fL (ref 78.0–100.0)
Platelets: 228 10*3/uL (ref 150–400)
RBC: 2.8 MIL/uL — ABNORMAL LOW (ref 4.22–5.81)
RDW: 14.6 % (ref 11.5–15.5)
WBC: 10.6 10*3/uL — ABNORMAL HIGH (ref 4.0–10.5)

## 2014-04-21 LAB — WOUND CULTURE

## 2014-04-21 LAB — GLUCOSE, CAPILLARY
GLUCOSE-CAPILLARY: 204 mg/dL — AB (ref 70–99)
GLUCOSE-CAPILLARY: 217 mg/dL — AB (ref 70–99)

## 2014-04-21 MED ORDER — LIDOCAINE-PRILOCAINE 2.5-2.5 % EX CREA
1.0000 "application " | TOPICAL_CREAM | CUTANEOUS | Status: DC | PRN
  Filled 2014-04-21: qty 5

## 2014-04-21 MED ORDER — HEPARIN SODIUM (PORCINE) 1000 UNIT/ML DIALYSIS
20.0000 [IU]/kg | INTRAMUSCULAR | Status: DC | PRN
  Filled 2014-04-21: qty 2

## 2014-04-21 MED ORDER — PENTAFLUOROPROP-TETRAFLUOROETH EX AERO: 1.0000 "application " | INHALATION_SPRAY | CUTANEOUS | Status: DC | PRN

## 2014-04-21 MED ORDER — HEPARIN SODIUM (PORCINE) 1000 UNIT/ML DIALYSIS
1000.0000 [IU] | INTRAMUSCULAR | Status: DC | PRN
  Filled 2014-04-21: qty 1

## 2014-04-21 MED ORDER — SULFAMETHOXAZOLE-TRIMETHOPRIM 400-80 MG PO TABS
1.0000 | ORAL_TABLET | Freq: Every day | ORAL | Status: DC
  Administered 2014-04-22 – 2014-04-24 (×3): 1 via ORAL
  Filled 2014-04-21 (×3): qty 1

## 2014-04-21 MED ORDER — NEPRO/CARBSTEADY PO LIQD: 237.0000 mL | ORAL | Status: DC | PRN

## 2014-04-21 MED ORDER — ALTEPLASE 2 MG IJ SOLR
2.0000 mg | Freq: Once | INTRAMUSCULAR | Status: DC | PRN
  Filled 2014-04-21: qty 2

## 2014-04-21 MED ORDER — DOXERCALCIFEROL 4 MCG/2ML IV SOLN
INTRAVENOUS | Status: AC
  Administered 2014-04-21: 1 ug via INTRAVENOUS
  Filled 2014-04-21: qty 2

## 2014-04-21 MED ORDER — SODIUM CHLORIDE 0.9 % IV SOLN: 100.0000 mL | INTRAVENOUS | Status: DC | PRN

## 2014-04-21 MED ORDER — LIDOCAINE HCL (PF) 1 % IJ SOLN: 5.0000 mL | INTRAMUSCULAR | Status: DC | PRN

## 2014-04-21 NOTE — Progress Notes (Signed)
PT Cancellation Note  Patient Details Name: Alvin Daniels MRN: [id number] DOB: [birth year]   Cancelled Treatment:    Reason Eval/Treat Not Completed: Patient at procedure or test/unavailable, at HD will re-attempt as time permits   Duncan Dull 04/21/2014, 8:58 AM  [name], PT DPT  [phone]

## 2014-04-21 NOTE — Progress Notes (Signed)
Patient ID: Alvin Daniels, male   DOB: June 08, 1970, 43 y.o.   MRN: [id number] Postoperative day 1 status post amputation of ray 3, 4 and 5 left foot. Patient had extensive abscess which involved the entire third fourth and fifth metatarsal heads. Patient may begin physical therapy progressive ambulation nonweightbearing on the left. Discharged to home once safe with ambulation. Keep dressing clean dry and intact I will need to follow-up in the office in one week.

## 2014-04-21 NOTE — Progress Notes (Signed)
**  Interval Note**  Paid patient a visit this morning after checking in with the RN regarding unrecorded vitals.  Patient developed an O2 requirement around 3 am with O2 saturations down into the 70's.  Unsure if there was a good waveform with this.  Patient placed on 2L O2.  This morning, patient found sleeping in bed off of O2 saturations in the 90's.  Patient reports to me that he is breathing well and is unsure as to why he needed O2 last evening.  He does report that his foot is painful this am.  RN reports that she has just given him his Percocet this am.  BP 69/36 in room.  Patient on exam, awake, alert, NAD, speaking in full sentences, AAOx3, pleasant.  Left foot wrapped.  Instructed patient to voice to nurse/Dr Jerline Pain if Percocet does not relieve pain.  He voices good understanding and again thanks all the staff for their kindness and excellent medical care.  Sharia Averitt M. Lajuana Ripple, DO PGY-1, [facility] 04/21/14, 07:15am

## 2014-04-21 NOTE — Progress Notes (Signed)
Interpreter Lesle Chris for [employer]

## 2014-04-21 NOTE — Progress Notes (Signed)
INFECTIOUS DISEASE PROGRESS NOTE  ID: Alvin Daniels is a 43 y.o. 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   Abtx:  Anti-infectives    Start     Dose/Rate Route Frequency Ordered Stop   04/24/14 1000  tenofovir (VIREAD) tablet 300 mg     300 mg Oral Every Sun 04/18/14 1626     04/19/14 2000  emtricitabine (EMTRIVA) capsule 200 mg  Status:  Discontinued     200 mg Oral Once per day on Tue Thu 04/18/14 1636 04/19/14 1220   04/19/14 2000  emtricitabine (EMTRIVA) capsule 200 mg     200 mg Oral Once per day on Tue Sat 04/19/14 1220     04/19/14 1800  ceFEPIme (MAXIPIME) 2 g in dextrose 5 % 50 mL IVPB     2 g100 mL/hr over 30 Minutes Intravenous Every T-Th-Sa (1800) 04/18/14 1644     04/19/14 1200  vancomycin (VANCOCIN) 500 mg in sodium chloride 0.9 % 100 mL IVPB     500 mg100 mL/hr over 60 Minutes Intravenous Every T-Th-Sa (Hemodialysis) 04/18/14 1327     04/19/14 0800  ritonavir (NORVIR) tablet 100 mg     100 mg Oral Daily with breakfast 04/18/14 1626     04/19/14 0800  Darunavir Ethanolate (PREZISTA) tablet 800 mg     800 mg Oral Daily with breakfast 04/18/14 1626     04/18/14 1700  metroNIDAZOLE (FLAGYL) IVPB 500 mg     500 mg100 mL/hr over 60 Minutes Intravenous Every 8 hours 04/18/14 1644     04/18/14 1645  ceFEPIme (MAXIPIME) 2 g in dextrose 5 % 50 mL IVPB     2 g100 mL/hr over 30 Minutes Intravenous  Once 04/18/14 1644 04/18/14 1819   04/18/14 1626  emtricitabine (EMTRIVA) capsule 200 mg  Status:  Discontinued     200 mg Oral See admin instructions 04/18/14 1626 04/18/14 1633   04/18/14 1400  piperacillin-tazobactam (ZOSYN) IVPB 2.25 g  Status:  Discontinued     2.25 g100 mL/hr over 30 Minutes Intravenous 3 times per day 04/18/14 1327 04/18/14 1626   04/18/14 1330  vancomycin (VANCOCIN)  IVPB 1000 mg/200 mL premix     1,000 mg200 mL/hr over 60 Minutes Intravenous STAT 04/18/14 1327 04/18/14 1520      Medications:  Scheduled: . ceFEPime (MAXIPIME) IV  2 g Intravenous Q T,Th,Sat-1800  . [START ON 04/26/2014] darbepoetin (ARANESP) injection - DIALYSIS  100 mcg Intravenous Q Tue-HD  . Darunavir Ethanolate  800 mg Oral Q breakfast  . docusate sodium  100 mg Oral BID  . doxercalciferol  1 mcg Intravenous Q T,Th,Sa-HD  . emtricitabine  200 mg Oral Once per day on Tue Sat  . feeding supplement (NEPRO CARB STEADY)  237 mL Oral BID BM  . ferric gluconate (FERRLECIT/NULECIT) IV  125 mg Intravenous Q T,Th,Sa-HD  . heparin  5,000 Units Subcutaneous 3 times per day  . insulin aspart  0-5 Units Subcutaneous QHS  . insulin aspart  0-9 Units Subcutaneous TID WC  . metronidazole  500 mg Intravenous Q8H  . midodrine  10 mg Oral TID WC  . pantoprazole  40 mg Oral Daily  . ritonavir  100 mg Oral Q breakfast  . sodium chloride  3 mL Intravenous Q12H  . [  START ON 04/24/2014] tenofovir  300 mg Oral Q Sun  . vancomycin  500 mg Intravenous Q T,Th,Sa-HD    Objective: Vital signs in last 24 hours: Temp:  [98.1 F (36.7 C)-99.3 F (37.4 C)] 98.2 F (36.8 C) (12/10 1316) Pulse Rate:  [79-98] 93 (12/10 1210) Resp:  [10-18] 16 (12/10 1210) BP: (70-113)/(44-97) 113/97 mmHg (12/10 1210) SpO2:  [97 %-100 %] 98 % (12/10 1210) Weight:  [52 kg (114 lb 10.2 oz)-52.2 kg (115 lb 1.3 oz)] 52 kg (114 lb 10.2 oz) (12/10 1210)   General appearance: alert, cooperative and no distress Extremities: edema none and L foot dressed.   Lab Results  Recent Labs  04/20/14 0230 04/21/14 0256  WBC 12.1* 10.6*  HGB 8.5* 8.7*  HCT 26.4* 27.5*  NA 140 132*  K 3.8 4.1  CL 99 94*  CO2 24 22  BUN 14 28*  CREATININE 4.73* 6.99*   Liver Panel  Recent Labs  04/19/14 0811  ALBUMIN 2.5*   Sedimentation Rate  Recent Labs  04/18/14 1715  ESRSEDRATE 104*   C-Reactive Protein  Recent Labs   04/18/14 1715  CRP 17.8*    Microbiology: Recent Results (from the past 240 hour(s))  Wound culture     Status: None   Collection Time: 04/18/14 12:40 PM  Result Value Ref Range Status   Specimen Description WOUND LEFT TOE  Final   Special Requests NONE  Final   Gram Stain   Final    RARE WBC PRESENT, PREDOMINANTLY PMN NO SQUAMOUS EPITHELIAL CELLS SEEN FEW GRAM NEGATIVE RODS Performed at [facility]    Culture   Final    MULTIPLE ORGANISMS PRESENT, NONE PREDOMINANT Note: NO STAPHYLOCOCCUS AUREUS ISOLATED NO GROUP A STREP (S.PYOGENES) ISOLATED Performed at [facility]    Report Status 04/21/2014 FINAL  Final  Blood culture (routine x 2)     Status: None (Preliminary result)   Collection Time: 04/18/14  1:00 PM  Result Value Ref Range Status   Specimen Description BLOOD RIGHT HAND  Final   Special Requests BOTTLES DRAWN AEROBIC AND ANAEROBIC 5CC  Final   Culture  Setup Time   Final    04/18/2014 18:24 Performed at [facility]    Culture   Final           BLOOD CULTURE RECEIVED NO GROWTH TO DATE CULTURE WILL BE HELD FOR 5 DAYS BEFORE ISSUING A FINAL NEGATIVE REPORT Performed at [facility]    Report Status PENDING  Incomplete  Blood culture (routine x 2)     Status: None (Preliminary result)   Collection Time: 04/18/14  1:05 PM  Result Value Ref Range Status   Specimen Description BLOOD RIGHT ANTECUBITAL  Final   Special Requests BOTTLES DRAWN AEROBIC AND ANAEROBIC 5CC  Final   Culture  Setup Time   Final    04/18/2014 18:23 Performed at [facility]    Culture   Final           BLOOD CULTURE RECEIVED NO GROWTH TO DATE CULTURE WILL BE HELD FOR 5 DAYS BEFORE ISSUING A FINAL NEGATIVE REPORT Performed at [facility]    Report Status PENDING  Incomplete    Studies/Results: US Abdomen Complete W/elastography  04/20/2014   CLINICAL DATA:  Chronic hepatitis-C without hepatic coma.  EXAM: ULTRASOUND ABDOMEN  ULTRASOUND  HEPATIC ELASTOGRAPHY  TECHNIQUE: Sonography of the upper abdomen was performed. In addition, ultrasound elastography evaluation of the liver was performed. A  region of interest was placed within the right lobe of the liver. Following application of a compressive sonographic pulse, shear waves were detected in the adjacent hepatic tissue and the shear wave velocity was calculated. Multiple assessments were performed at the selected site. Median shear wave velocity is correlated to a Metavir fibrosis score.  COMPARISON:  None.  FINDINGS: ULTRASOUND ABDOMEN  Gallbladder: No gallstones or wall thickening visualized. No sonographic Murphy sign noted.  Common bile duct: Diameter: 12 mm. Mild dilatation of intrahepatic bile ducts also noted.  Liver: No focal lesion identified. Within normal limits in parenchymal echogenicity.  IVC: No abnormality visualized.  Pancreas: Mild dilatation of pancreatic duct seen in visualized portion of pancreatic body measuring 3 4 mm. Remainder pancreas not well visualized.  Spleen: Size and appearance within normal limits.  Right Kidney: Length: 9.9 cm. Mildly increased renal parenchymal echogenicity. Multiple shadowing renal calculi seen measuring less than 1 cm. No mass or hydronephrosis visualized.  Left Kidney: Length: 10.3 cm. Mildly increased renal parenchymal echogenicity. Multiple shadowing renal calculi seen measuring less than 1 cm. No mass or hydronephrosis visualized.  Abdominal aorta: No aneurysm visualized.  Other findings: None.  ULTRASOUND HEPATIC ELASTOGRAPHY  Device: Siemens Helix VTQ  Transducer 4V  Patient position: Left lateral decubitus  Number of measurements:  10  Hepatic Segment:  8  Median velocity:   1.49  m/sec  IQR: 0.17  IQR/Median velocity ratio 0.11  Corresponding Metavir fibrosis score:  F2 +some F3  Risk of fibrosis: Moderate  Limitations of exam: None  Pertinent findings noted on other imaging exams:  None  Please note that abnormal shear wave velocities  may also be identified in clinical settings other than with hepatic fibrosis, such as: acute hepatitis, elevated right heart and central venous pressures including use of beta blockers, veno-occlusive disease (Budd-Chiari), infiltrative processes such as mastocytosis/amyloidosis/infiltrative tumor, extrahepatic cholestasis, in the post-prandial state, and liver transplantation. Correlation with patient history, laboratory data, and clinical condition recommended.  IMPRESSION: No evidence of gallstones.  Diffuse biliary ductal dilatation and mild pancreatic ductal dilatation. Etiology not apparent by ultrasound. Recommend correlation with liver function tests, and consider abdomen MRI and MRCP without and with contrast for further evaluation.  Bilateral nephrolithiasis. Increased renal parenchymal echogenicity, consistent with medical renal as. No evidence hydronephrosis.  Median hepatic shear wave velocity is calculated at 1.49 m/sec.  Corresponding Metavir fibrosis score is F2 + some F3.  Risk of fibrosis is moderate.  Follow-up:  Additional testing appropriate   Electronically Signed   By: Earle Gell M.D.   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 HIV RNA is 42.  No change in ART.  No OR Cx Given that he had a poly microbial Cx and large abscess seen in OR, would aim for 1 month of levaquin. Please have him see me in clinic in 2-3 weeks post d/c.           [name] Infectious Diseases (pager) [phone] [email or web address] 04/21/2014, 2:48 PM  LOS: 3 days

## 2014-04-21 NOTE — Progress Notes (Signed)
ANTIBIOTIC CONSULT NOTE - FOLLOW UP  Pharmacy Consult for Vanco/Cefepime Indication: L 4th toe diabetic foot infection  No Known Allergies  Patient Measurements: Weight: 114 lb 10.2 oz (52 kg) (Bed) Adjusted Body Weight:    Vital Signs: Temp: 98.1 F (36.7 C) (12/10 1210) Temp Source: Oral (12/10 1210) BP: 113/97 mmHg (12/10 1210) Pulse Rate: 93 (12/10 1210) Intake/Output from previous day: 12/09 0701 - 12/10 0700 In: 506 [I.V.:306; IV Piggyback:200] Out: -  Intake/Output from this shift: Total I/O In: -  Out: -431   Labs:  Recent Labs  04/19/14 0811 04/20/14 0230 04/21/14 0256  WBC 15.2* 12.1* 10.6*  HGB 11.0* 8.5* 8.7*  PLT 197 178 228  CREATININE 10.81* 4.73* 6.99*   CrCl cannot be calculated (Unknown ideal weight.). No results for input(s): VANCOTROUGH, VANCOPEAK, VANCORANDOM, GENTTROUGH, GENTPEAK, GENTRANDOM, TOBRATROUGH, TOBRAPEAK, TOBRARND, AMIKACINPEAK, AMIKACINTROU, AMIKACIN in the last 72 hours.   Microbiology: Recent Results (from the past 720 hour(s))  Wound culture     Status: None   Collection Time: 04/18/14 12:40 PM  Result Value Ref Range Status   Specimen Description WOUND LEFT TOE  Final   Special Requests NONE  Final   Gram Stain   Final    RARE WBC PRESENT, PREDOMINANTLY PMN NO SQUAMOUS EPITHELIAL CELLS SEEN FEW GRAM NEGATIVE RODS Performed at [facility]    Culture   Final    MULTIPLE ORGANISMS PRESENT, NONE PREDOMINANT Note: NO STAPHYLOCOCCUS AUREUS ISOLATED NO GROUP A STREP (S.PYOGENES) ISOLATED Performed at [facility]    Report Status 04/21/2014 FINAL  Final  Blood culture (routine x 2)     Status: None (Preliminary result)   Collection Time: 04/18/14  1:00 PM  Result Value Ref Range Status   Specimen Description BLOOD RIGHT HAND  Final   Special Requests BOTTLES DRAWN AEROBIC AND ANAEROBIC 5CC  Final   Culture  Setup Time   Final    04/18/2014 18:24 Performed at [facility]    Culture   Final          BLOOD CULTURE RECEIVED NO GROWTH TO DATE CULTURE WILL BE HELD FOR 5 DAYS BEFORE ISSUING A FINAL NEGATIVE REPORT Performed at [facility]    Report Status PENDING  Incomplete  Blood culture (routine x 2)     Status: None (Preliminary result)   Collection Time: 04/18/14  1:05 PM  Result Value Ref Range Status   Specimen Description BLOOD RIGHT ANTECUBITAL  Final   Special Requests BOTTLES DRAWN AEROBIC AND ANAEROBIC 5CC  Final   Culture  Setup Time   Final    04/18/2014 18:23 Performed at [facility]    Culture   Final           BLOOD CULTURE RECEIVED NO GROWTH TO DATE CULTURE WILL BE HELD FOR 5 DAYS BEFORE ISSUING A FINAL NEGATIVE REPORT Performed at [facility]    Report Status PENDING  Incomplete    Anti-infectives    Start     Dose/Rate Route Frequency Ordered Stop   04/24/14 1000  tenofovir (VIREAD) tablet 300 mg     300 mg Oral Every Sun 04/18/14 1626     04/19/14 2000  emtricitabine (EMTRIVA) capsule 200 mg  Status:  Discontinued     200 mg Oral Once per day on Tue Thu 04/18/14 1636 04/19/14 1220   04/19/14 2000  emtricitabine (EMTRIVA) capsule 200 mg     200 mg Oral Once per day on Tue Sat  04/19/14 1220     04/19/14 1800  ceFEPIme (MAXIPIME) 2 g in dextrose 5 % 50 mL IVPB     2 g100 mL/hr over 30 Minutes Intravenous Every T-Th-Sa (1800) 04/18/14 1644     04/19/14 1200  vancomycin (VANCOCIN) 500 mg in sodium chloride 0.9 % 100 mL IVPB     500 mg100 mL/hr over 60 Minutes Intravenous Every T-Th-Sa (Hemodialysis) 04/18/14 1327     04/19/14 0800  ritonavir (NORVIR) tablet 100 mg     100 mg Oral Daily with breakfast 04/18/14 1626     04/19/14 0800  Darunavir Ethanolate (PREZISTA) tablet 800 mg     800 mg Oral Daily with breakfast 04/18/14 1626     04/18/14 1700  metroNIDAZOLE (FLAGYL) IVPB 500 mg     500 mg100 mL/hr over 60 Minutes Intravenous Every 8 hours 04/18/14 1644     04/18/14 1645  ceFEPIme (MAXIPIME) 2 g in dextrose 5 % 50 mL IVPB      2 g100 mL/hr over 30 Minutes Intravenous  Once 04/18/14 1644 04/18/14 1819   04/18/14 1626  emtricitabine (EMTRIVA) capsule 200 mg  Status:  Discontinued     200 mg Oral See admin instructions 04/18/14 1626 04/18/14 1633   04/18/14 1400  piperacillin-tazobactam (ZOSYN) IVPB 2.25 g  Status:  Discontinued     2.25 g100 mL/hr over 30 Minutes Intravenous 3 times per day 04/18/14 1327 04/18/14 1626   04/18/14 1330  vancomycin (VANCOCIN) IVPB 1000 mg/200 mL premix     1,000 mg200 mL/hr over 60 Minutes Intravenous STAT 04/18/14 1327 04/18/14 1520      Assessment: 43 y.o. male presents with foot pain. Pt also noted some drainage.  Infectious Disease: Vanc/Cefepime for foot infection. Tmax 99.3. WBC = 10.6 down. Now s/p 3,4,5th toe amputations from extensive abscesses. HIV +, followed by Dr. [name], prezista, emtriva, norvir, viread resumed 12/7 Vanc>> 12/7 Flagyl> 12/7 Cefepime>  12/7 Bldx2>>pending 12/7 Wound>>Negative  Cardiovascular: BP 113/97, HR 93 on midodrine  Endocrinology: DM, glucose stable, Insulin at home, A1c=6. SSI, CBGs 121-164  Neuro: diabetic with polyneuropathy  Nephrology: ESRD - HD TTS as o/p - on schedule. Aranesp/tues, [facility]  Pulmonary: RA  Hematology / Oncology: HgB = 8.7  PTA Medication Issues: Reconciled  Best Practices: sq heparin  Plan:  1. Cefepime 2 grams iv Q HD (TTS) 2. Vancomycin 1gm IV now then 500mg  IV with HD (T/T/S) 3. Flagyl 500 iv Q 8 (dc?) 4. Abx should end 12/12 per FM note.   Megan Hayduk S. Alford Highland, PharmD, [place] Clinical Staff Pharmacist Pager [phone]  [place], [place] 04/21/2014,1:04 PM

## 2014-04-21 NOTE — Progress Notes (Signed)
Family Medicine Teaching Service Daily Progress Note Intern Pager: [phone]  Patient name: Alvin Daniels Medical record number: [id number] Date of birth: Aug 12, 1970 Age: 43 y.o. Gender: male  Primary Care Provider: No primary care provider on file. Consultants: Orthopedics, Wound Code Status: Full  Pt Overview and Major Events to Date:  12/7 - Admitted for left 4th toe osteomyelitis 12/7 - Transferred to step down unit due to persistent hypotension 12/9 - Amputation of left 3-5th ray on foot  Assessment and Plan: Alvin Daniels is a 43 y.o. male presenting with left 4th toe osteomyelisits. PMH is significant for PMH is significant for ESRD on dialysis, T2DM, chronic HCV, and HIV.   #Sepsis 2/2 left 4th toe osteomyelitis. Patient met sepsis criteria on admission (HR, fever, and WBC). Patient additionally became persistently hypotensive on the day of admission, requiring transfer to the step down unit. On chart review, it appears that the patient has low BP at baseline, with several recent admissions having SBP in the 70s-90s. Patient does not have an elevated lactic acid, and he appears well on exam. Not likely in severe sepsis/septic shock. -s/p 1 dose of vanc and zosyn in ED -vanc (12/7- ), cefepime (12/7- ), flagyl (12/7- ) -Blood culture: No growth to date -Superficial wound culture: Multiple organisms, none predominant -morphine prn pain -Consulted orthopedics, appreciate assistance -s/p 3rd-5th left foot ray amputation 12/9 -Will continue IV antibiotics through 12/12 per ortho -ABI wnl -Started midodrine 12/8 -Low threshold for consulting CCM if patient continues to be hypotensive  #Back Pain. Seems to be more in MSK in etiology. No alarm signs or symptoms concerning for epidural abscess, cauda equina syndrome, or other neurological impingement. No imaging indicated at this time. -conservative management - tylenol prn -consider PT once decision made on potential  intervention for his toe  #T2DM with polyneuropathy. Patient reports being on 70/30 insulin twice daily (20 in the morning, 35 at night), however on chart review (patient with 2 charts set for merging), patient appears to be on 75/25 15U twice daily. A1c 5.7 in June. -A1c 6.0 here -SSI  #Normocytic anemia. Hgb 10.2, MCV 98 on admission. Baseline HgB 11-13. Iron panel in 2014 not consistent with iron deficiency. Likely related to anemia of chronic disease. - Aranesp and venofer per renal -Continue to monitor  #ESRD on dialysis. On TTS schedule. Nephrology consulted, appreciate assistance -On home HD schedule -[facility]   #HIV, chronic HCV. CD4 count 260 in October. Followed by Dr [name].  -Continue home prezista, emtriva, norvir, and viread -CD4 170 -HIV RNA Quant 37  FEN/GI: renal diet afterwards, Saline lock IV Prophylaxis: Subcutaneous heparin  Disposition: Admitted to step down pending above management.  Subjective:  Increased pain in foot this morning. Back pain a little better. No fevers or chills. No chest pain or shortness of breath.   Objective: Temp:  [98.1 F (36.7 C)-99.3 F (37.4 C)] 98.6 F (37 C) (12/10 0500) Pulse Rate:  [82-101] 88 (12/10 0500) Resp:  [10-35] 17 (12/10 0500) BP: (61-103)/(28-61) 86/56 mmHg (12/10 0500) SpO2:  [94 %-100 %] 100 % (12/10 0550) Physical Exam: General: NAD, lying in hospital bed Cardiovascular: RRR, no murmurs appreciated Respiratory: NWOB, CTAB, no wheezes or crackles Abdomen: +BS, soft, NT, ND Extremities: AVF on LUE with palpable thrill. S/p left 3-5th toe amputation. Dressings clean, dry, and intact.   Laboratory:  Recent Labs Lab 04/19/14 0811 04/20/14 0230 04/21/14 0256  WBC 15.2* 12.1* 10.6*  HGB 11.0* 8.5* 8.7*  HCT 33.4* 26.4* 27.5*  PLT 197 178 228    Recent Labs Lab 04/18/14 1300  04/19/14 0811 04/20/14 0230 04/21/14 0256  NA 132*  < > 133* 140 132*  K 4.5  < > 5.1 3.8 4.1  CL 90*  < > 95* 99 94*   CO2 24  < > _0 BUN 36*  < > 43* 14 28*  CREATININE 9.56*  < > 10.81* 4.73* 6.99*  CALCIUM 9.1  < > 8.5 8.0* 7.9*  PROT 8.6*  --   --   --   --   BILITOT 0.5  --   --   --   --   ALKPHOS 77  --   --   --   --   ALT 17  --   --   --   --   AST 27  --   --   --   --   GLUCOSE 158*  < > 142* 141* 184*  < > = values in this interval not displayed. Lactic acid 0.7 CRP 17.8 ESR 104  CD4 170 HIV RNA Quant 37  Imaging/Diagnostic Tests: No new  Dimas Chyle, MD 04/21/2014, 7:34 AM PGY-1, [place] Intern pager: [phone], text pages welcome

## 2014-04-21 NOTE — Evaluation (Signed)
Physical Therapy Evaluation Patient Details Name: Alvin Daniels MRN: [id number] DOB: 03/05/1971 Today's Date: 04/21/2014   History of Present Illness  Patient is a 43 yo male  status post amputation of ray 3, 4 and 5 left foot  Clinical Impression  Patient demonstrates deficits in functional mobility as indicated below. Will need continued skilled PT to address deficits and maximize function. Will see as indicated and progress as tolerated.    Follow Up Recommendations Supervision/Assistance - 24 hour (May need SNF if assist/supervision not available)    Equipment Recommendations  Rolling walker with 5" wheels    Recommendations for Other Services       Precautions / Restrictions Precautions Precautions: Fall Restrictions Weight Bearing Restrictions: Yes LLE Weight Bearing: Non weight bearing      Mobility  Bed Mobility Overal bed mobility: Needs Assistance Bed Mobility: Supine to Sit     Supine to sit: Min assist     General bed mobility comments: increased time, assist for rotation to EOB  Transfers Overall transfer level: Needs assistance Equipment used: Rolling walker (2 wheeled) Transfers: Sit to/from [facility] Sit to Stand: Min assist Stand pivot transfers: Min assist       General transfer comment: Poor compliance with NWBing LLE, multiple cues and demonstration to instruct [place], assist for elevation and controlled descent to chair, VCs for hand placement, manual assist to elevate LLE  Ambulation/Gait Ambulation/Gait assistance: Min assist;Mod assist Ambulation Distance (Feet): 30 Feet Assistive device: Rolling walker (2 wheeled) Gait Pattern/deviations: Step-to pattern     General Gait Details: poor WBing compliance  Stairs            Wheelchair Mobility    Modified Rankin (Stroke Patients Only)       Balance                                             Pertinent Vitals/Pain Pain  Assessment: Faces Faces Pain Scale: Hurts little more Pain Location: Left LE, from toes up to thigh Pain Descriptors / Indicators: Aching;Constant Pain Intervention(s): Limited activity within patient's tolerance;Repositioned;Monitored during session    Home Living Family/patient expects to be discharged to:: Private residence Living Arrangements: Other relatives Available Help at Discharge:  (unsure) Type of Home: Apartment Home Access: Stairs to enter Entrance Stairs-Rails: None Entrance Stairs-Number of Steps: 1 Home Layout: One level Home Equipment: None      Prior Function Level of Independence: Independent               Hand Dominance   Dominant Hand: Right    Extremity/Trunk Assessment   Upper Extremity Assessment: Overall WFL for tasks assessed           Lower Extremity Assessment: LLE deficits/detail   LLE Deficits / Details: toe amputation     Communication   Communication: Prefers language other than English  Cognition Arousal/Alertness: Awake/alert Behavior During Therapy: WFL for tasks assessed/performed Overall Cognitive Status: Difficult to assess                      General Comments General comments (skin integrity, edema, etc.): Low BP    Exercises        Assessment/Plan    PT Assessment Patient needs continued PT services  PT Diagnosis Difficulty walking;Abnormality of gait;Acute pain   PT Problem List Decreased activity tolerance;Decreased balance;Decreased mobility;Decreased knowledge  of use of DME;Decreased safety awareness;Pain  PT Treatment Interventions DME instruction;Gait training;Functional mobility training;Therapeutic activities;Therapeutic exercise;Balance training;Patient/family education   PT Goals (Current goals can be found in the Care Plan section) Acute Rehab PT Goals Patient Stated Goal: none stated PT Goal Formulation: With patient Time For Goal Achievement: 05/05/14 Potential to Achieve Goals:  Good    Frequency Min 3X/week   Barriers to discharge   decreased caregiver support    Co-evaluation               End of Session Equipment Utilized During Treatment: Gait belt Activity Tolerance: Patient tolerated treatment well;Patient limited by fatigue Patient left: in chair;with call bell/phone within reach Nurse Communication: Mobility status         Time: [id number] PT Time Calculation (min) (ACUTE ONLY): 27 min   Charges:   PT Evaluation $Initial PT Evaluation Tier I: 1 Procedure PT Treatments $Gait Training: 8-22 mins $Therapeutic Activity: 8-22 mins   PT G CodesDuncan Dull 04/21/2014, 3:48 PM [name], [place] DPT  [phone]

## 2014-04-21 NOTE — Procedures (Signed)
Tolerating hemodialysis. No fluid being removed. S/P left 3,4,5th toes ray amputation. Low BP but not unusual. Jessi Jessop C

## 2014-04-22 ENCOUNTER — Encounter (HOSPITAL_COMMUNITY): Payer: Self-pay | Admitting: Orthopedic Surgery

## 2014-04-22 LAB — GLUCOSE, CAPILLARY
GLUCOSE-CAPILLARY: 163 mg/dL — AB (ref 70–99)
GLUCOSE-CAPILLARY: 152 mg/dL — AB (ref 70–99)
GLUCOSE-CAPILLARY: 139 mg/dL — AB (ref 70–99)
Glucose-Capillary: 171 mg/dL — ABNORMAL HIGH (ref 70–99)

## 2014-04-22 LAB — CBC
HCT: 27.7 % — ABNORMAL LOW (ref 39.0–52.0)
HEMOGLOBIN: 8.9 g/dL — AB (ref 13.0–17.0)
MCH: 31.4 pg (ref 26.0–34.0)
MCHC: 32.1 g/dL (ref 30.0–36.0)
MCV: 97.9 fL (ref 78.0–100.0)
Platelets: 249 10*3/uL (ref 150–400)
RBC: 2.83 MIL/uL — ABNORMAL LOW (ref 4.22–5.81)
RDW: 14.6 % (ref 11.5–15.5)
WBC: 10.5 10*3/uL (ref 4.0–10.5)

## 2014-04-22 MED ORDER — MIDODRINE HCL 10 MG PO TABS: 10.0000 mg | ORAL_TABLET | Freq: Three times a day (TID) | ORAL | Status: DC

## 2014-04-22 MED ORDER — OXYCODONE-ACETAMINOPHEN 5-325 MG PO TABS: 1.0000 | ORAL_TABLET | ORAL | Status: DC | PRN

## 2014-04-22 MED ORDER — METHOCARBAMOL 500 MG PO TABS: 500.0000 mg | ORAL_TABLET | Freq: Four times a day (QID) | ORAL | Status: DC | PRN

## 2014-04-22 MED ORDER — POLYETHYLENE GLYCOL 3350 17 G PO PACK
17.0000 g | PACK | Freq: Every day | ORAL | Status: DC | PRN
  Filled 2014-04-22: qty 1

## 2014-04-22 MED ORDER — LEVOFLOXACIN 750 MG PO TABS: 750.0000 mg | ORAL_TABLET | Freq: Every day | ORAL | Status: DC

## 2014-04-22 MED ORDER — POLYETHYLENE GLYCOL 3350 17 G PO PACK: 17.0000 g | PACK | Freq: Every day | ORAL | Status: DC | PRN

## 2014-04-22 MED ORDER — SULFAMETHOXAZOLE-TRIMETHOPRIM 400-80 MG PO TABS: 1.0000 | ORAL_TABLET | Freq: Every day | ORAL | Status: DC

## 2014-04-22 MED ORDER — DSS 100 MG PO CAPS: 100.0000 mg | ORAL_CAPSULE | Freq: Two times a day (BID) | ORAL | Status: DC

## 2014-04-22 MED ORDER — ONDANSETRON HCL 4 MG PO TABS: 4.0000 mg | ORAL_TABLET | Freq: Four times a day (QID) | ORAL | Status: DC | PRN

## 2014-04-22 NOTE — Progress Notes (Signed)
Orthopedic Tech Progress Note Patient Details:  Alvin Daniels 1971/01/24 [id number]  Ortho Devices Type of Ortho Device: Postop shoe/boot Ortho Device/Splint Location: LLE Ortho Device/Splint Interventions: Ordered, Application   Braulio Bosch 04/22/2014, 12:31 PM

## 2014-04-22 NOTE — Progress Notes (Signed)
Assessment: 1. L 4th toe gangrene - x-ray 12/7 suggests osteomyelitis; 4th & 5th ray amputation due to DM/HIV 2. ESRD - HD on TTS @ [facility], K 4.8. HD in AM 3. Hypotension/volume - BP chronically low, on Midodrine 5 mg tid,  4. HIV - on antivirals  Subjective: Interval History: none.  Objective: Vital signs in last 24 hours: Temp:  [97.6 F (36.4 C)-98.7 F (37.1 C)] 98.5 F (36.9 C) (12/11 1227) Pulse Rate:  [77-89] 85 (12/11 0705) Resp:  [6-18] 6 (12/11 0032) BP: (76-125)/(50-81) 89/57 mmHg (12/11 1227) SpO2:  [95 %-98 %] 98 % (12/11 0705) Weight:  [52 kg (114 lb 10.2 oz)] 52 kg (114 lb 10.2 oz) (12/11 0400) Weight change:   Intake/Output from previous day: 12/10 0701 - 12/11 0700 In: 713 [P.O.:400; I.V.:3; IV Piggyback:310] Out: -331 [Emesis/NG output:100] Intake/Output this shift:    No change in PE  Lab Results:  Recent Labs  04/21/14 0256 04/22/14 0347  WBC 10.6* 10.5  HGB 8.7* 8.9*  HCT 27.5* 27.7*  PLT 228 249   BMET:  Recent Labs  04/20/14 0230 04/21/14 0256  NA 140 132*  K 3.8 4.1  CL 99 94*  CO2 24 22  GLUCOSE 141* 184*  BUN 14 28*  CREATININE 4.73* 6.99*  CALCIUM 8.0* 7.9*   No results for input(s): PTH in the last 72 hours. Iron Studies: No results for input(s): IRON, TIBC, TRANSFERRIN, FERRITIN in the last 72 hours. Studies/Results: No results found.  Scheduled: . ceFEPime (MAXIPIME) IV  2 g Intravenous Q T,Th,Sat-1800  . [START ON 04/26/2014] darbepoetin (ARANESP) injection - DIALYSIS  100 mcg Intravenous Q Tue-HD  . Darunavir Ethanolate  800 mg Oral Q breakfast  . docusate sodium  100 mg Oral BID  . doxercalciferol  1 mcg Intravenous Q T,Th,Sa-HD  . emtricitabine  200 mg Oral Once per day on Tue Sat  . feeding supplement (NEPRO CARB STEADY)  237 mL Oral BID BM  . ferric gluconate (FERRLECIT/NULECIT) IV  125 mg Intravenous Q T,Th,Sa-HD  . heparin  5,000 Units Subcutaneous 3 times per day  . insulin aspart  0-5 Units Subcutaneous QHS   . insulin aspart  0-9 Units Subcutaneous TID WC  . metronidazole  500 mg Intravenous Q8H  . midodrine  10 mg Oral TID WC  . pantoprazole  40 mg Oral Daily  . ritonavir  100 mg Oral Q breakfast  . sodium chloride  3 mL Intravenous Q12H  . sulfamethoxazole-trimethoprim  1 tablet Oral Daily  . [START ON 04/24/2014] tenofovir  300 mg Oral Q Sun  . vancomycin  500 mg Intravenous Q T,Th,Sa-HD     LOS: 4 days   Leather Estis C 04/22/2014,12:48 PM

## 2014-04-22 NOTE — Progress Notes (Signed)
Physical Therapy Treatment Patient Details Name: Alvin Daniels MRN: [id number] DOB: 19-Feb-1971 Today's Date: 04/22/2014    History of Present Illness Patient is a 43 yo male  status post amputation of ray 3, 4 and 5 left foot    PT Comments    Patient educated on technique for ambulation with RW, cues for compliance on NWBing.  Tactile cues and demonstration for patient compliance. Patient did require one seated rest break during ambulation. Educated patient regarding safety with mobility and need for use of RW. Will continue to see and progress as tolerated.   Follow Up Recommendations  Supervision/Assistance - 24 hour     Equipment Recommendations  Rolling walker with 5" wheels    Recommendations for Other Services       Precautions / Restrictions Precautions Precautions: Fall Precaution Comments: post op shoe RLE Restrictions Weight Bearing Restrictions: Yes LLE Weight Bearing: Non weight bearing    Mobility  Bed Mobility Overal bed mobility: Needs Assistance Bed Mobility: Supine to Sit     Supine to sit: Supervision     General bed mobility comments: increased time to perform, no physical assist required today  Transfers Overall transfer level: Needs assistance Equipment used: Rolling walker (2 wheeled) Transfers: Sit to/from Stand Sit to Stand: Min assist Stand pivot transfers: Min assist       General transfer comment: VCs and tactile cues for hand placement and safety with mobility, cues for compliance with NWBing   Ambulation/Gait Ambulation/Gait assistance: Min guard Ambulation Distance (Feet): 50 Feet Assistive device: Rolling walker (2 wheeled) Gait Pattern/deviations: Step-to pattern Gait velocity: decreased Gait velocity interpretation: Below normal speed for age/gender General Gait Details: improved NWBing compliance with cues   Stairs            Wheelchair Mobility    Modified Rankin (Stroke Patients Only)        Balance                                    Cognition Arousal/Alertness: Awake/alert Behavior During Therapy: WFL for tasks assessed/performed Overall Cognitive Status: Difficult to assess                      Exercises      General Comments        Pertinent Vitals/Pain Pain Assessment: No/denies pain    Home Living                      Prior Function            PT Goals (current goals can now be found in the care plan section) Acute Rehab PT Goals Patient Stated Goal: none stated PT Goal Formulation: With patient Time For Goal Achievement: 05/05/14 Potential to Achieve Goals: Good Progress towards PT goals: Progressing toward goals    Frequency  Min 3X/week    PT Plan Current plan remains appropriate    Co-evaluation             End of Session Equipment Utilized During Treatment: Gait belt Activity Tolerance: Patient tolerated treatment well;Patient limited by fatigue Patient left: in chair;with call bell/phone within reach     Time: [id number] PT Time Calculation (min) (ACUTE ONLY): 17 min  Charges:  $Gait Training: 8-22 mins                    G Codes:  Duncan Dull 04/22/2014, 3:02 PM [name], [place] DPT  [phone]

## 2014-04-22 NOTE — Progress Notes (Signed)
Family Medicine Teaching Service Daily Progress Note Intern Pager: [phone]  Patient name: Alvin Daniels Medical record number: [id number] Date of birth: January 01, 1971 Age: 43 y.o. Gender: male  Primary Care Provider: No primary care provider on file. Consultants: Orthopedics, Wound Code Status: Full  Pt Overview and Major Events to Date:  12/7 - Admitted for left 4th toe osteomyelitis 12/7 - Transferred to step down unit due to persistent hypotension 12/9 - Amputation of left 3-5th ray on foot  Assessment and Plan: [name] is a 43 y.o. male presenting with left 4th toe osteomyelisits. PMH is significant for PMH is significant for ESRD on dialysis, T2DM, chronic HCV, and HIV.   #Osteomyelitis s/p amputation. Patient met sepsis criteria on admission (HR, fever, and WBC). Patient additionally became persistently hypotensive on the day of admission, requiring transfer to the step down unit. On chart review, it appears that the patient has low BP at baseline, with several recent admissions having SBP in the 70s-90s. Patient does not have an elevated lactic acid, and he appears well on exam. Not likely in severe sepsis/septic shock. -Consulted orthopedics, appreciate assistance, s/p 3rd-5th left foot ray amputation 12/9 -vanc (12/7- ), cefepime (12/7- ), flagyl (12/7- ), will continue through 12/12 per ortho -Blood culture: No growth to date -Superficial wound culture: Multiple organisms, none predominant -percocet prn pain -zofran prn nausea -Started midodrine 12/8 for hypotension  #Back Pain. Seems to be more in MSK in etiology. No alarm signs or symptoms concerning for epidural abscess, cauda equina syndrome, or other neurological impingement. No imaging indicated at this time. -conservative management   #T2DM with polyneuropathy. Patient reports being on 70/30 insulin twice daily (20 in the morning, 35 at night), however on chart review (patient with 2 charts set for  merging), patient appears to be on 75/25 15U twice daily. A1c 5.7 in June. -A1c 6.0 here -SSI  #Normocytic anemia. Hgb 10.2, MCV 98 on admission. Baseline HgB 11-13. Iron panel in 2014 not consistent with iron deficiency. Likely related to anemia of chronic disease. - Aranesp and venofer per renal -Continue to monitor  #ESRD on dialysis. On TTS schedule. Nephrology consulted, appreciate assistance -On home HD schedule -[facility]   #HIV, chronic HCV. CD4 count 260 in October. Followed by Dr [name].  -Continue home prezista, emtriva, norvir, and viread -CD4 170 - will start PCP prophylaxis with bactrim -HIV RNA Quant 37  FEN/GI: renal diet, Saline lock IV Prophylaxis: Subcutaneous heparin  Disposition: Admitted to step down pending above management.  Subjective:  Patient had an episode of emesis yesterday since eating chicken broth. Nonbloody. No diarrhea. Abdominal pain associated with vomiting. Foot pain about the same this morning. Back pain resolved.  No fevers or chills. No chest pain or shortness of breath. Patient reports that he will have someone to assist him 24hr/day at home.   Objective: Temp:  [97.6 F (36.4 C)-98.7 F (37.1 C)] 98.7 F (37.1 C) (12/11 0523) Pulse Rate:  [77-93] 79 (12/11 0523) Resp:  [6-18] 6 (12/11 0032) BP: (70-125)/(44-97) 76/50 mmHg (12/11 0523) SpO2:  [95 %-98 %] 98 % (12/11 0523) Weight:  [114 lb 10.2 oz (52 kg)-115 lb 1.3 oz (52.2 kg)] 114 lb 10.2 oz (52 kg) (12/11 0400) Physical Exam: General: NAD, lying in hospital bed Cardiovascular: RRR, no murmurs appreciated Respiratory: NWOB, CTAB, no wheezes or crackles Abdomen: +BS, soft, NT, ND Extremities: AVF on LUE with palpable thrill. S/p left 3-5th toe amputation. Dressings clean, dry, and intact.   Laboratory:  Recent  Labs Lab 04/20/14 0230 04/21/14 0256 04/22/14 0347  WBC 12.1* 10.6* 10.5  HGB 8.5* 8.7* 8.9*  HCT 26.4* 27.5* 27.7*  PLT 178 228 249    Recent Labs Lab  04/18/14 1300  04/19/14 0811 04/20/14 0230 04/21/14 0256  NA 132*  < > 133* 140 132*  K 4.5  < > 5.1 3.8 4.1  CL 90*  < > 95* 99 94*  CO2 24  < > 19 24 22   BUN 36*  < > 43* 14 28*  CREATININE 9.56*  < > 10.81* 4.73* 6.99*  CALCIUM 9.1  < > 8.5 8.0* 7.9*  PROT 8.6*  --   --   --   --   BILITOT 0.5  --   --   --   --   ALKPHOS 77  --   --   --   --   ALT 17  --   --   --   --   AST 27  --   --   --   --   GLUCOSE 158*  < > 142* 141* 184*  < > = values in this interval not displayed. Lactic acid 0.7 CRP 17.8 ESR 104  CD4 170 HIV RNA Quant 37  Imaging/Diagnostic Tests: No new  Dimas Chyle, MD 04/22/2014, 7:31 AM PGY-1, [place] Intern pager: [phone], text pages welcome

## 2014-04-22 NOTE — Discharge Instructions (Signed)
You were admitted with an infection in your foot. You met with our orthopedist who amputated the infect portion of your foot. While here you were given IV antibiotics and transitioned to oral antibiotics. It is important that you continue to take these antibiotics as prescribed and that you meet with your primary doctor, your infectious disease doctor, and the orthopedist.   Infecciones en huesos y articulaciones  (Bone and Joint Infections) Las infecciones en las articulaciones se denominan artritis sptica o infecciosa. Una articulacin infectada daa los cartlagos y los tejidos muy rpidamente. Y puede destruir Water engineer. [place] (osteomielitis) pueden tener una duracin de varios aos. Las articulaciones pueden volverse rgidas si no se tratan. La causa ms frecuente son los grmenes (bacterias); virus y hongos son raros. Generalmente aparecen luego de una lesin o infeccin en otras zonas del organismo. Los grmenes se transportan [employer] o articulaciones a travs del torrente sanguneo.  CAUSAS  Los grmenes que transporta la sangre desde el lugar de una infeccin pueden diseminarse a un hueso o a Insurance claims handler. El estafilococo es la causa ms frecuente de osteomielitis y de artritis sptica.  A partir de una lesin pueden introducirse grmenes en [place] articulaciones. SNTOMAS  Prdida de peso  Cansancio  Escalofros o fiebre.  Dolor en el hueso o la articulacin durante el reposo y la [place].  Sensibilidad al tocar la zona o al doblar la articulacin.  Rechazo a Hotel manager pierna o Risk manager el brazo debido al [employer].  Disminucin del rango de movimientos en la articulacin.  Enrojecimiento, calor y sensibilidad en la piel.  Llagas abiertas en la piel con secrecin. FACTORES DE RIESGO: Nios, ancianos y aquellas personas con un sistema inmunolgico daado, tienen ms riesgo de infecciones en los [employer] y en las  articulaciones. Es ms frecuente en personas que sufren VIH y aquellos que son sometidos quimioterapia. Tambin tienen ms [employer] que tienen objetos metlicos implantados en su cuerpo mediante ciruga para estabilizar el hueso. Las placas, tornillos o articulaciones artificiales proporcionan superficies en las que las bacterias pueden adherirse. Este desarrollo de las bacterias se denomina biofilm. El biofilm protege a los grmenes de los [employer] atacan (antibiticos) y de las defensas del organismo. Esto hace que los grmenes se multipliquen. Otros factores que aumentan el riesgo son:   Deirdre Priest sometido a Leisure centre manager de huesos o articulaciones.  Tomar dosis elevadas de corticoides e inmunosupresores que debilitan la resistencia del organismo a los grmenes.  Sufrir diabetes o enfermedades crnicas.  Usar drogas de la calle por va intravenosa.  Estar en tratamiento de hemodilisis.  Tener una historia de enfermedades del tracto urinario.  Haber sufrido la extirpacin del bazo (esplenectoma). Esto produce un debilitamiento del sistema inmunolgico.  Infecciones virales crnicas como VIH/SIDA.  Falta de sensibilidad como paraplejia, cuadriplejia o espina bfida. DIAGNSTICO  Aumento del nmero de glbulos blancos puede indicar una infeccin. En algunos casos el mdico puede identificar los grmenes realizando cultivos de [place]. Los Automatic Data inflamatorios presentes en la sangre como la eritrosedimentacin o la protena c-reactiva pueden ser marcadores de una infeccin profunda.  Para el diagnstico de osteomielitis es necesario realizar escaneo de los huesos y Manufacturing engineer. Pueden ser de utilidad para que el mdico encuentre las zonas infectadas. Ayudarn a Hydrographic surveyor acumulacin de lquidos en las articulaciones, superficies seas anormales o sern de utilidad para el diagnstico de artritis sptica. Son de utilidad para Orthoptist tejidos blandos hinchados y Location manager de  lquido  en una articulacin infectada. Otros estudios brindarn informacin detallada: Ellos son:  Steward Drone.  TC (tomografa computarizada)  IMR (resonancia magntica)  La mejor prueba para diagnosticar una infeccin sea o articular es la aspiracin o biopsia. El Designer, jewellery un anestsico local. Podr remover tejido de un hueso lesionado o utilizar una aguja para tomar lquidos de la articulacin infectada. El anestsico local puede ser [place]. Se utiliza para adormecer la zona en la que se Customer service manager biopsia. Con frecuencia las biopsias se realizan en las sala de operaciones bajo anestesia general. Los anlisis de las muestras pueden identificar si existe una infeccin. TRATAMIENTO  El tratamiento ayuda a Chief Technology Officer las infecciones de [place]. La infeccin puede reaparecer.  Cualquier hueso o articulacin puede infectarse.  Estas infecciones rara vez son fatales.  Si la infeccin sea (osteomielitis) no se trata, puede convertirse en una infeccin que no puede controlarse. Puede diseminarse a otras zonas del organismo. Finalmente puede causar la muerte del [place]. El resultado es la reduccin de la funcin del miembro o de la articulacin. En los casos graves, requiere la extirpacin del [place]. La osteomielitis espinal es muy peligrosa. Si no se trata, puede daar los nervios espinales y causar la muerte.  La complicacin ms frecuente de la artritis sptica es la osteoartritis, con dolor y disminucin de la amplitud de movimientos de Water engineer. Algunas formas de tratamiento son:  Si la causa de la infeccin es una bacteria, se trata con antibiticos. Probablemente le indiquen medicamentos por va intravenosa, durante un tiempo que puede variar entre 2 y 6 semanas. En algunos casos, especialmente en los nios, es efectiva la administracin de antibiticos por va oral luego de una dosis inicial por va intravenosa. El tratamiento depende de:  El tipo de bacteria.  La  ubicacion de la infeccion.  El tipode cirugia que podra Dispensing optician.  Otras enfermedades o problemas.  El mdico drenar los abscesos en los tejidos blandos o las acumulaciones de lquido alrededor de los huesos o articulaciones infectados. Si sufre artritis sptica, todos los [employer] mdico utilizar una aguja para drenar el pus de la articulacin . Puede utilizar un artroscopio para Database administrator o podra ser necesario abrir la articulacin quirrgicamente para retirar el tejido daado y Counselling psychologist infeccin. El artroscopio es un instrumento similar a un delgado telescopio luminoso. Se utiliza para observar el interior de Water engineer.  Es necesario realizar una ciruga si la infeccin se ha vuelto crnica. Tambin podr colocar placas, tornillos o articulaciones artificiales. En algunos casos es Chartered loss adjuster un injerto para llenar un espacio vaco. De este modo se favorece el crecimiento de los tejidos nuevos y se mejora el flujo sanguneo de la zona. PREVENCIN  Lave y desinfecte las heridas rpidamente para prevenir el comienzo de una infeccin sea o articular. Siga un tratamiento en todas las infecciones para evitar que se diseminen.  No fume. El cigarrillo disminuye la velocidad de curacin del hueso y predispone a las infecciones.  Cuando se reciben medicamentos que deprimen el sistema inmunolgico, deben seguirse las indicaciones del mdico. No tome ms de lo que Training and development officer.  Cuide la piel y los pies, especialmente si es diabtico, tiene falta de sensibilidad o problemas circulatorios. SOLICITE ATENCIN MDICA DE INMEDIATO SI:  No puede soportar el peso en una pierna o usar un brazo, especialmente luego de Editor, commissioning. Esto puede ser indicio de una infeccin sea o articular.  Observa signos o sntomas de infeccin sea o articular. Si  se trata tempranamente, tendr ms probabilidades de eliminar la infeccin y de mejorar. Document Released:  07/26/2008 Document Revised: 07/22/2011 [facility] Patient Information 2015 [place]. This information is not intended to replace advice given to you by your health care provider. Make sure you discuss any questions you have with your health care provider.

## 2014-04-22 NOTE — Progress Notes (Signed)
C/o nausea; vomiting x 2 this shift and medicated accordingly with Zofran alternating with Reglan.

## 2014-04-23 DIAGNOSIS — M9080 Osteopathy in diseases classified elsewhere, unspecified site: Secondary | ICD-10-CM

## 2014-04-23 LAB — BASIC METABOLIC PANEL
Anion gap: 21 — ABNORMAL HIGH (ref 5–15)
BUN: 27 mg/dL — ABNORMAL HIGH (ref 6–23)
CHLORIDE: 101 meq/L (ref 96–112)
CO2: 21 mEq/L (ref 19–32)
Calcium: 8.3 mg/dL — ABNORMAL LOW (ref 8.4–10.5)
Creatinine, Ser: 6.62 mg/dL — ABNORMAL HIGH (ref 0.50–1.35)
GFR, EST AFRICAN AMERICAN: 11 mL/min — AB (ref >90)
GFR, EST NON AFRICAN AMERICAN: 9 mL/min — AB (ref >90)
Glucose, Bld: 162 mg/dL — ABNORMAL HIGH (ref 70–99)
POTASSIUM: 4 meq/L (ref 3.7–5.3)
Sodium: 143 mEq/L (ref 137–147)

## 2014-04-23 LAB — GLUCOSE, CAPILLARY
GLUCOSE-CAPILLARY: 174 mg/dL — AB (ref 70–99)
Glucose-Capillary: 191 mg/dL — ABNORMAL HIGH (ref 70–99)
Glucose-Capillary: 123 mg/dL — ABNORMAL HIGH (ref 70–99)
Glucose-Capillary: 139 mg/dL — ABNORMAL HIGH (ref 70–99)

## 2014-04-23 LAB — CBC
HCT: 29 % — ABNORMAL LOW (ref 39.0–52.0)
Hemoglobin: 9.4 g/dL — ABNORMAL LOW (ref 13.0–17.0)
MCH: 32.2 pg (ref 26.0–34.0)
MCHC: 32.4 g/dL (ref 30.0–36.0)
MCV: 99.3 fL (ref 78.0–100.0)
PLATELETS: 331 10*3/uL (ref 150–400)
RBC: 2.92 MIL/uL — AB (ref 4.22–5.81)
RDW: 14.8 % (ref 11.5–15.5)
WBC: 10.7 10*3/uL — AB (ref 4.0–10.5)

## 2014-04-23 MED ORDER — NEPRO/CARBSTEADY PO LIQD
237.0000 mL | ORAL | Status: DC | PRN
  Filled 2014-04-23: qty 237

## 2014-04-23 MED ORDER — HEPARIN SODIUM (PORCINE) 1000 UNIT/ML DIALYSIS: 20.0000 [IU]/kg | INTRAMUSCULAR | Status: DC | PRN

## 2014-04-23 MED ORDER — LEVOFLOXACIN 500 MG PO TABS
500.0000 mg | ORAL_TABLET | ORAL | Status: DC
  Filled 2014-04-23: qty 1

## 2014-04-23 MED ORDER — LIDOCAINE HCL (PF) 1 % IJ SOLN: 5.0000 mL | INTRAMUSCULAR | Status: DC | PRN

## 2014-04-23 MED ORDER — SODIUM CHLORIDE 0.9 % IV SOLN: 100.0000 mL | INTRAVENOUS | Status: DC | PRN

## 2014-04-23 MED ORDER — DOXERCALCIFEROL 4 MCG/2ML IV SOLN
INTRAVENOUS | Status: AC
  Filled 2014-04-23: qty 2

## 2014-04-23 MED ORDER — LIDOCAINE-PRILOCAINE 2.5-2.5 % EX CREA
1.0000 "application " | TOPICAL_CREAM | CUTANEOUS | Status: DC | PRN
  Filled 2014-04-23: qty 5

## 2014-04-23 MED ORDER — HEPARIN SODIUM (PORCINE) 1000 UNIT/ML DIALYSIS: 1000.0000 [IU] | INTRAMUSCULAR | Status: DC | PRN

## 2014-04-23 MED ORDER — LEVOFLOXACIN 750 MG PO TABS
750.0000 mg | ORAL_TABLET | Freq: Once | ORAL | Status: AC
  Administered 2014-04-23: 750 mg via ORAL
  Filled 2014-04-23: qty 1

## 2014-04-23 MED ORDER — PENTAFLUOROPROP-TETRAFLUOROETH EX AERO: 1.0000 "application " | INHALATION_SPRAY | CUTANEOUS | Status: DC | PRN

## 2014-04-23 MED ORDER — ALTEPLASE 2 MG IJ SOLR
2.0000 mg | Freq: Once | INTRAMUSCULAR | Status: DC | PRN
  Filled 2014-04-23: qty 2

## 2014-04-23 NOTE — Plan of Care (Signed)
Problem: Phase I Progression Outcomes Goal: Incision/dressings dry and intact Outcome: Not Progressing Surgeon assessed in am, re wrapped bandage

## 2014-04-23 NOTE — Procedures (Signed)
Tolerating hemodialysis without hemodynamic instability. Alvin Daniels

## 2014-04-23 NOTE — Anesthesia Postprocedure Evaluation (Signed)
  Anesthesia Post-op Note  Patient: Alvin Daniels  Procedure(s) Performed: Procedure(s): 3rd toe amputation, 4th Toe Amputation,  5th Toe Amputation (Left)  Patient Location: PACU  Anesthesia Type:General  Level of Consciousness: awake  Airway and Oxygen Therapy: Patient Spontanous Breathing  Post-op Pain: mild  Post-op Assessment: Post-op Vital signs reviewed  Post-op Vital Signs: Reviewed  Last Vitals:  Filed Vitals:   04/23/14 1600  BP: 97/62  Pulse: 74  Temp:   Resp:     Complications: No apparent anesthesia complications

## 2014-04-23 NOTE — Progress Notes (Signed)
CBG taken upon arriving back on unit ; 123

## 2014-04-23 NOTE — Progress Notes (Signed)
Family Medicine Teaching Service Daily Progress Note Intern Pager: [phone]  Patient name: Alvin Daniels Medical record number: [id number] Date of birth: 11-29-1970 Age: 43 y.o. Gender: male  Primary Care Provider: [facility] Consultants: Orthopedics, Wound Code Status: Full  Pt Overview and Major Events to Date:  12/7 - Admitted for left 4th toe osteomyelitis 12/7 - Transferred to step down unit due to persistent hypotension 12/9 - Amputation of left 3-5th ray on foot 12/12 - transition to PO levaquin  Assessment and Plan: [name] is a 43 y.o. male presenting with left 4th toe osteomyelisits. PMH is significant for PMH is significant for ESRD on dialysis, T2DM, chronic HCV, and HIV.   #Osteomyelitis s/p amputation. Patient met sepsis criteria on admission (HR, fever, and WBC). Patient additionally became persistently hypotensive on the day of admission, requiring transfer to the step down unit. On chart review, it appears that the patient has low BP at baseline, with several recent admissions having SBP in the 70s-90s. Vital signs have been stable. Expect wound to be slow healing given DM and likely microvascular disease.  -Consulted orthopedics, appreciate assistance, s/p 3rd-5th left foot ray amputation 12/9 - may need to consider having ortho look at the foot over the weekend -vanc (12/7-12/12), cefepime (12/7-12/12), flagyl (12/7-12/12) - transition to levaquin today -Blood culture: No growth to date -Superficial wound culture: Multiple organisms, none predominant -percocet prn pain -zofran prn nausea  #Hypotension: is at baseline at this time. Is a chronic issue that has been treated with midodrine in the past. 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  -Started midodrine 12/8 for hypotension  #Back Pain. Seems to be more in MSK in etiology. No alarm signs or symptoms concerning for epidural abscess, cauda equina syndrome, or  other neurological impingement. No imaging indicated at this time. -resolved  #T2DM with polyneuropathy. Patient reports being on 70/30 insulin twice daily (20 in the morning, 35 at night), however on chart review (patient with 2 charts set for merging), patient appears to be on 75/25 15U twice daily. A1c 5.7 in June. CBGs in 150-175 range.  -A1c 6.0 here -SSI  #Normocytic anemia. Hgb 10.2, MCV 98 on admission. Baseline HgB 11-13. Iron panel in 2014 not consistent with iron deficiency. Likely related to anemia of chronic disease. Stable. - Aranesp and venofer per renal - Continue to monitor  #ESRD on dialysis. On TTS schedule. Nephrology consulted, appreciate assistance -On home HD schedule -[facility]  -HD today  #HIV, chronic HCV. CD4 count 260 in October. Followed by Dr [name].  -Continue home prezista, emtriva, norvir, and viread -CD4 170 - will start PCP prophylaxis with bactrim -HIV RNA Quant 37  FEN/GI: renal diet, Saline lock IV Prophylaxis: Subcutaneous heparin  Disposition: likely discharge tomorrow if transition to levaquin goes well today.   Subjective:  Notes abdominal pain and nausea resolved. Only complaint is a small amount of foot pain.   Objective: Temp:  [98.2 F (36.8 C)-98.9 F (37.2 C)] 98.3 F (36.8 C) (12/12 0455) Pulse Rate:  [76-94] 76 (12/12 0800) Resp:  [19] 19 (12/12 0800) BP: (84-102)/(52-66) 84/55 mmHg (12/12 0800) SpO2:  [92 %-99 %] 96 % (12/12 0800) Weight:  [122 lb 2.2 oz (55.4 kg)] 122 lb 2.2 oz (55.4 kg) (12/12 0455) Physical Exam: General: NAD, lying in hospital bed Cardiovascular: RRR, no murmurs appreciated Respiratory: NWOB, CTAB, no wheezes or crackles Abdomen: soft, NT, ND Extremities: left foot s/p amputation of 3-5 toes, dressing was removed today and there  was old blood with a small amount of fresh blood present, there is whiteness of the skin of the skin flap, there is no pus draining from this area, he has good sensation to  light touch of his 1st and 2nd toes   Laboratory:  Recent Labs Lab 04/21/14 0256 04/22/14 0347 04/23/14 0319  WBC 10.6* 10.5 10.7*  HGB 8.7* 8.9* 9.4*  HCT 27.5* 27.7* 29.0*  PLT 228 249 331    Recent Labs Lab 04/18/14 1300  04/20/14 0230 04/21/14 0256 04/23/14 0319  NA 132*  < > 140 132* 143  K 4.5  < > 3.8 4.1 4.0  CL 90*  < > 99 94* 101  CO2 24  < > 24 22 21   BUN 36*  < > 14 28* 27*  CREATININE 9.56*  < > 4.73* 6.99* 6.62*  CALCIUM 9.1  < > 8.0* 7.9* 8.3*  PROT 8.6*  --   --   --   --   BILITOT 0.5  --   --   --   --   ALKPHOS 77  --   --   --   --   ALT 17  --   --   --   --   AST 27  --   --   --   --   GLUCOSE 158*  < > 141* 184* 162*  < > = values in this interval not displayed. Lactic acid 0.7 CRP 17.8 ESR 104  CD4 170 HIV RNA Quant 37  Imaging/Diagnostic Tests: No new imaging  Leone Haven, MD 04/23/2014, 9:31 AM PGY-3, [place] Intern pager: [phone], text pages welcome

## 2014-04-24 LAB — CULTURE, BLOOD (ROUTINE X 2)
Culture: NO GROWTH
Culture: NO GROWTH

## 2014-04-24 LAB — CBC
HCT: 29.7 % — ABNORMAL LOW (ref 39.0–52.0)
Hemoglobin: 9.5 g/dL — ABNORMAL LOW (ref 13.0–17.0)
MCH: 31.6 pg (ref 26.0–34.0)
MCHC: 32 g/dL (ref 30.0–36.0)
MCV: 98.7 fL (ref 78.0–100.0)
PLATELETS: 332 10*3/uL (ref 150–400)
RBC: 3.01 MIL/uL — ABNORMAL LOW (ref 4.22–5.81)
RDW: 14.7 % (ref 11.5–15.5)
WBC: 10.2 10*3/uL (ref 4.0–10.5)

## 2014-04-24 LAB — GLUCOSE, CAPILLARY: Glucose-Capillary: 171 mg/dL — ABNORMAL HIGH (ref 70–99)

## 2014-04-24 MED ORDER — SULFAMETHOXAZOLE-TRIMETHOPRIM 400-80 MG PO TABS: 1.0000 | ORAL_TABLET | ORAL | Status: DC

## 2014-04-24 MED ORDER — LEVOFLOXACIN 500 MG PO TABS: 500.0000 mg | ORAL_TABLET | ORAL | Status: DC

## 2014-04-24 MED ORDER — OXYCODONE-ACETAMINOPHEN 5-325 MG PO TABS: 1.0000 | ORAL_TABLET | ORAL | Status: DC | PRN

## 2014-04-24 MED ORDER — SULFAMETHOXAZOLE-TRIMETHOPRIM 400-80 MG PO TABS: 1.0000 | ORAL_TABLET | Freq: Every day | ORAL | Status: DC

## 2014-04-24 NOTE — Progress Notes (Signed)
On deck for discharge today.  He is stable, but dialysis has been a challenge due to low BP. Alvin Daniels

## 2014-04-24 NOTE — Plan of Care (Signed)
Problem: Phase I Progression Outcomes Goal: Vital signs/hemodynamically stable Outcome: Adequate for Discharge Soft BP, med is aware

## 2014-04-24 NOTE — Progress Notes (Signed)
Nurse performed discharge teaching with pt via translator services ([employer]).  Per translator pt understood instructions and all questions were answered.  Nurse also reviewed instructions with family friend that spoke english ( [employer]), friend also translated to patient to reiterate information.  MD contacted brother on file to inform of discharge and the need for follow up appointments.  Pt was discharged to home with friend.  Pt carried belongings home.

## 2014-04-24 NOTE — Progress Notes (Signed)
Family Medicine Teaching Service Daily Progress Note Intern Pager: [phone]  Patient name: Alvin Daniels Medical record number: [id number] Date of birth: [birth year] Age: 43 y.o. Gender: male  Primary Care Provider: [facility] Consultants: Orthopedics, Wound Code Status: Full  Pt Overview and Major Events to Date:  12/7 - Admitted for left 4th toe osteomyelitis 12/7 - Transferred to step down unit due to persistent hypotension 12/9 - Amputation of left 3-5th ray on foot 12/12 - transition to PO levaquin  Assessment and Plan: Martyn Dolata is a 43 y.o. male presenting with left 4th toe osteomyelisits. PMH is significant for PMH is significant for ESRD on dialysis, T2DM, chronic HCV, and HIV.   #Osteomyelitis s/p amputation. Patient met sepsis criteria on admission (HR, fever, and WBC). Patient additionally became persistently hypotensive on the day of admission, requiring transfer to the step down unit. On chart review, it appears that the patient has low BP at baseline, with several recent admissions having SBP in the 70s-90s. Vital signs have been stable. Expect wound to be slow healing given DM and likely microvascular disease.  -Consulted orthopedics, appreciate assistance, s/p 3rd-5th left foot ray amputation 12/9  -vanc (12/7-12/12), cefepime (12/7-12/12), flagyl (12/7-12/12) - Now on PO levaquin for the next month -Blood culture: No growth to date -Superficial wound culture: Multiple organisms, none predominant -percocet prn pain -zofran prn nausea  #Hypotension: is at baseline at this time. Is a chronic issue that has been treated with midodrine in the past. 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  -Started midodrine 12/8 for hypotension -will discharge on this  #T2DM with polyneuropathy. Patient reports being on 70/30 insulin twice daily (20 in the morning, 35 at night), however on chart review (patient with 2 charts set for  merging), patient appears to be on 75/25 15U twice daily. A1c 5.7 in June. CBGs in 120-190 range.  -A1c 6.0 here -SSI - getting anywhere from 0-5 u SSI in hospital  #Normocytic anemia. Hgb 10.2, MCV 98 on admission. Baseline HgB 11-13. Iron panel in 2014 not consistent with iron deficiency. Likely related to anemia of chronic disease. Stable. - Aranesp and venofer per renal - Continue to monitor  #ESRD on dialysis. On TTS schedule. Nephrology consulted, appreciate assistance -On home HD schedule -[facility]   #HIV, chronic HCV. CD4 count 260 in October. Followed by Dr [name].  -Continue home prezista, emtriva, norvir, and viread -CD4 170 - will start PCP prophylaxis with bactrim -HIV RNA Quant 37  FEN/GI: renal diet, Saline lock IV Prophylaxis: Subcutaneous heparin  Disposition: discharge today  Subjective:  Notes small amount of foot pain. Small amount of vomit this morning, though no abdominal discomfort at this time and feels well.   Objective: Temp:  [97.7 F (36.5 C)-98.4 F (36.9 C)] 98.4 F (36.9 C) (12/13 0437) Pulse Rate:  [72-83] 83 (12/13 0437) Resp:  [13-19] 17 (12/13 0437) BP: (84-138)/(53-92) 91/53 mmHg (12/13 0437) SpO2:  [96 %-100 %] 100 % (12/13 0437) Weight:  [114 lb 3.2 oz (51.8 kg)-115 lb 4.8 oz (52.3 kg)] 114 lb 3.2 oz (51.8 kg) (12/12 1738) Physical Exam: General: NAD, lying in hospital bed Cardiovascular: RRR, no murmurs appreciated Respiratory: NWOB, CTAB, no wheezes or crackles Extremities: left foot s/p amputation of 3-5 toes, dressing was removed today and there is serosanguinous fluid noted on dressing, there is whiteness of the skin of the skin flap, there is no pus draining from this area, he has good sensation to light touch of his  1st and 2nd toes   Laboratory:  Recent Labs Lab 04/22/14 0347 04/23/14 0319 04/24/14 0310  WBC 10.5 10.7* 10.2  HGB 8.9* 9.4* 9.5*  HCT 27.7* 29.0* 29.7*  PLT 249 331 332    Recent Labs Lab  04/18/14 1300  04/20/14 0230 04/21/14 0256 04/23/14 0319  NA 132*  < > 140 132* 143  K 4.5  < > 3.8 4.1 4.0  CL 90*  < > 99 94* 101  CO2 24  < > 24 22 21   BUN 36*  < > 14 28* 27*  CREATININE 9.56*  < > 4.73* 6.99* 6.62*  CALCIUM 9.1  < > 8.0* 7.9* 8.3*  PROT 8.6*  --   --   --   --   BILITOT 0.5  --   --   --   --   ALKPHOS 77  --   --   --   --   ALT 17  --   --   --   --   AST 27  --   --   --   --   GLUCOSE 158*  < > 141* 184* 162*  < > = values in this interval not displayed. Lactic acid 0.7 CRP 17.8 ESR 104  CD4 170 HIV RNA Quant 37  Imaging/Diagnostic Tests: No new imaging  Leone Haven, MD 04/24/2014, 6:40 AM PGY-3, [place] Intern pager: [phone], text pages welcome

## 2014-04-24 NOTE — Progress Notes (Signed)
Family Practice Teaching Service Interval Progress Note  Spoke with patients brother over the phone regarding discharge as patient gave verbal consent to do this. Advised that it is very important for the patient to follow-up with Dr Sharol Given, the orthopedic surgeon, this week. Advised that the contact information would be in his discharge papers and they needed to call tomorrow to get this scheduled for later this week. He voiced understanding.   Tommi Rumps, MD Family Medicine PGY-3 Service Pager [phone]

## 2014-04-25 ENCOUNTER — Encounter: Payer: Self-pay | Admitting: Infectious Diseases

## 2014-04-25 LAB — GLUCOSE, CAPILLARY: GLUCOSE-CAPILLARY: 183 mg/dL — AB (ref 70–99)

## 2014-04-26 NOTE — Addendum Note (Signed)
Addendum  created 04/26/14 1233 by Rudean Curt, MD   Modules edited: Anesthesia Attestations

## 2014-05-02 ENCOUNTER — Ambulatory Visit (HOSPITAL_COMMUNITY): Payer: Medicaid Other | Admitting: Anesthesiology

## 2014-05-02 ENCOUNTER — Encounter (HOSPITAL_COMMUNITY): Payer: Self-pay | Admitting: *Deleted

## 2014-05-02 ENCOUNTER — Ambulatory Visit (HOSPITAL_COMMUNITY)
Admission: AD | Admit: 2014-05-02 | Discharge: 2014-05-03 | Disposition: A | Discharge status: Home / Self Care | Payer: Medicaid Other | Source: Ambulatory Visit | Attending: Orthopedic Surgery | Admitting: Orthopedic Surgery

## 2014-05-02 ENCOUNTER — Encounter (HOSPITAL_COMMUNITY)
Admission: AD | Disposition: A | Discharge status: Home / Self Care | Payer: Self-pay | Source: Ambulatory Visit | Attending: Orthopedic Surgery

## 2014-05-02 ENCOUNTER — Other Ambulatory Visit (HOSPITAL_COMMUNITY): Payer: Self-pay | Admitting: Orthopedic Surgery

## 2014-05-02 DIAGNOSIS — E1140 Type 2 diabetes mellitus with diabetic neuropathy, unspecified: Secondary | ICD-10-CM | POA: Diagnosis not present

## 2014-05-02 DIAGNOSIS — L02818 Cutaneous abscess of other sites: Secondary | ICD-10-CM | POA: Diagnosis present

## 2014-05-02 DIAGNOSIS — I96 Gangrene, not elsewhere classified: Secondary | ICD-10-CM | POA: Insufficient documentation

## 2014-05-02 DIAGNOSIS — B1920 Unspecified viral hepatitis C without hepatic coma: Secondary | ICD-10-CM | POA: Diagnosis not present

## 2014-05-02 DIAGNOSIS — E1122 Type 2 diabetes mellitus with diabetic chronic kidney disease: Secondary | ICD-10-CM | POA: Diagnosis not present

## 2014-05-02 DIAGNOSIS — Z21 Asymptomatic human immunodeficiency virus [HIV] infection status: Secondary | ICD-10-CM | POA: Diagnosis not present

## 2014-05-02 DIAGNOSIS — E1152 Type 2 diabetes mellitus with diabetic peripheral angiopathy with gangrene: Secondary | ICD-10-CM | POA: Diagnosis present

## 2014-05-02 DIAGNOSIS — Z992 Dependence on renal dialysis: Secondary | ICD-10-CM | POA: Diagnosis not present

## 2014-05-02 DIAGNOSIS — N186 End stage renal disease: Secondary | ICD-10-CM | POA: Insufficient documentation

## 2014-05-02 HISTORY — DX: Type 2 diabetes mellitus with hyperglycemia: E11.65

## 2014-05-02 HISTORY — DX: Type 2 diabetes mellitus with diabetic neuropathy, unspecified: E11.40

## 2014-05-02 HISTORY — PX: FOOT AMPUTATION THROUGH ANKLE: SHX643

## 2014-05-02 HISTORY — DX: Type 2 diabetes mellitus with unspecified complications: E11.8

## 2014-05-02 HISTORY — PX: AMPUTATION: SHX166

## 2014-05-02 LAB — POCT I-STAT 4, (NA,K, GLUC, HGB,HCT)
GLUCOSE: 51 mg/dL — AB (ref 70–99)
HCT: 35 % — ABNORMAL LOW (ref 39.0–52.0)
HEMOGLOBIN: 11.9 g/dL — AB (ref 13.0–17.0)
Potassium: 3.5 mEq/L — ABNORMAL LOW (ref 3.7–5.3)
Sodium: 138 mEq/L (ref 137–147)

## 2014-05-02 LAB — GLUCOSE, CAPILLARY
GLUCOSE-CAPILLARY: 85 mg/dL (ref 70–99)
GLUCOSE-CAPILLARY: 93 mg/dL (ref 70–99)
Glucose-Capillary: 52 mg/dL — ABNORMAL LOW (ref 70–99)
Glucose-Capillary: 76 mg/dL (ref 70–99)

## 2014-05-02 SURGERY — AMPUTATION, FOOT, PARTIAL
Anesthesia: General | Site: Foot | Laterality: Left

## 2014-05-02 MED ORDER — INSULIN ASPART 100 UNIT/ML ~~LOC~~ SOLN
0.0000 [IU] | Freq: Three times a day (TID) | SUBCUTANEOUS | Status: DC
  Administered 2014-05-03: 2 [IU] via SUBCUTANEOUS

## 2014-05-02 MED ORDER — METOCLOPRAMIDE HCL 10 MG PO TABS: 5.0000 mg | ORAL_TABLET | Freq: Three times a day (TID) | ORAL | Status: DC | PRN

## 2014-05-02 MED ORDER — OXYCODONE-ACETAMINOPHEN 5-325 MG PO TABS
1.0000 | ORAL_TABLET | ORAL | Status: DC | PRN
  Administered 2014-05-02 – 2014-05-03 (×3): 2 via ORAL
  Filled 2014-05-02 (×3): qty 2

## 2014-05-02 MED ORDER — PROPOFOL 10 MG/ML IV BOLUS
INTRAVENOUS | Status: AC
  Filled 2014-05-02: qty 20

## 2014-05-02 MED ORDER — HYDROMORPHONE HCL 1 MG/ML IJ SOLN
INTRAMUSCULAR | Status: AC
  Filled 2014-05-02: qty 1

## 2014-05-02 MED ORDER — DEXTROSE 50 % IV SOLN
25.0000 mL | Freq: Once | INTRAVENOUS | Status: AC
  Administered 2014-05-02: 25 mL via INTRAVENOUS

## 2014-05-02 MED ORDER — ASPIRIN EC 325 MG PO TBEC
325.0000 mg | DELAYED_RELEASE_TABLET | Freq: Every day | ORAL | Status: DC
  Administered 2014-05-02 – 2014-05-03 (×2): 325 mg via ORAL
  Filled 2014-05-02 (×3): qty 1

## 2014-05-02 MED ORDER — CEFAZOLIN SODIUM-DEXTROSE 2-3 GM-% IV SOLR
2.0000 g | INTRAVENOUS | Status: AC
  Administered 2014-05-02: 2 g via INTRAVENOUS
  Filled 2014-05-02: qty 50

## 2014-05-02 MED ORDER — MIDAZOLAM HCL 2 MG/2ML IJ SOLN
INTRAMUSCULAR | Status: AC
  Filled 2014-05-02: qty 2

## 2014-05-02 MED ORDER — RITONAVIR 100 MG PO TABS
100.0000 mg | ORAL_TABLET | Freq: Every day | ORAL | Status: DC
  Administered 2014-05-03: 100 mg via ORAL
  Filled 2014-05-02 (×2): qty 1

## 2014-05-02 MED ORDER — DEXTROSE 50 % IV SOLN
INTRAVENOUS | Status: AC
  Administered 2014-05-02: 25 mL via INTRAVENOUS
  Filled 2014-05-02: qty 50

## 2014-05-02 MED ORDER — OXYCODONE HCL 5 MG/5ML PO SOLN: 5.0000 mg | Freq: Once | ORAL | Status: AC | PRN

## 2014-05-02 MED ORDER — CEFAZOLIN SODIUM 1-5 GM-% IV SOLN
1.0000 g | Freq: Once | INTRAVENOUS | Status: AC
  Administered 2014-05-03: 1 g via INTRAVENOUS
  Filled 2014-05-02: qty 50

## 2014-05-02 MED ORDER — PHENYLEPHRINE HCL 10 MG/ML IJ SOLN
INTRAMUSCULAR | Status: DC | PRN
  Administered 2014-05-02 (×2): 120 ug via INTRAVENOUS

## 2014-05-02 MED ORDER — SODIUM CHLORIDE 0.9 % IR SOLN
Status: DC | PRN
  Administered 2014-05-02: 1

## 2014-05-02 MED ORDER — SODIUM CHLORIDE 0.9 % IV SOLN
INTRAVENOUS | Status: DC
  Administered 2014-05-02: 23:00:00 via INTRAVENOUS

## 2014-05-02 MED ORDER — SODIUM CHLORIDE 0.9 % IV SOLN
Freq: Once | INTRAVENOUS | Status: AC
  Administered 2014-05-02: 15:00:00 via INTRAVENOUS

## 2014-05-02 MED ORDER — DARUNAVIR ETHANOLATE 800 MG PO TABS
800.0000 mg | ORAL_TABLET | Freq: Every day | ORAL | Status: DC
  Administered 2014-05-03: 800 mg via ORAL
  Filled 2014-05-02 (×2): qty 1

## 2014-05-02 MED ORDER — HYDROMORPHONE HCL 1 MG/ML IJ SOLN: 0.5000 mg | INTRAMUSCULAR | Status: DC | PRN

## 2014-05-02 MED ORDER — DOCUSATE SODIUM 100 MG PO CAPS
100.0000 mg | ORAL_CAPSULE | Freq: Two times a day (BID) | ORAL | Status: DC
  Administered 2014-05-02 – 2014-05-03 (×2): 100 mg via ORAL
  Filled 2014-05-02 (×3): qty 1

## 2014-05-02 MED ORDER — HYDROMORPHONE HCL 1 MG/ML IJ SOLN
0.2500 mg | INTRAMUSCULAR | Status: DC | PRN
  Administered 2014-05-02 (×2): 0.5 mg via INTRAVENOUS

## 2014-05-02 MED ORDER — ONDANSETRON HCL 4 MG/2ML IJ SOLN: 4.0000 mg | Freq: Four times a day (QID) | INTRAMUSCULAR | Status: DC | PRN

## 2014-05-02 MED ORDER — FENTANYL CITRATE 0.05 MG/ML IJ SOLN
INTRAMUSCULAR | Status: DC | PRN
  Administered 2014-05-02: 100 ug via INTRAVENOUS

## 2014-05-02 MED ORDER — OXYCODONE HCL 5 MG PO TABS
ORAL_TABLET | ORAL | Status: AC
  Filled 2014-05-02: qty 1

## 2014-05-02 MED ORDER — METHOCARBAMOL 500 MG PO TABS
500.0000 mg | ORAL_TABLET | Freq: Four times a day (QID) | ORAL | Status: DC | PRN
  Administered 2014-05-03: 500 mg via ORAL
  Filled 2014-05-02 (×2): qty 1

## 2014-05-02 MED ORDER — PROPOFOL 10 MG/ML IV BOLUS
INTRAVENOUS | Status: DC | PRN
  Administered 2014-05-02: 140 mg via INTRAVENOUS

## 2014-05-02 MED ORDER — EMTRICITABINE 200 MG PO CAPS
200.0000 mg | ORAL_CAPSULE | Freq: Every day | ORAL | Status: DC
  Administered 2014-05-02: 200 mg via ORAL
  Filled 2014-05-02 (×2): qty 1

## 2014-05-02 MED ORDER — ONDANSETRON HCL 4 MG/2ML IJ SOLN
INTRAMUSCULAR | Status: DC | PRN
  Administered 2014-05-02: 4 mg via INTRAVENOUS

## 2014-05-02 MED ORDER — TENOFOVIR DISOPROXIL FUMARATE 300 MG PO TABS
300.0000 mg | ORAL_TABLET | Freq: Every day | ORAL | Status: DC
  Administered 2014-05-03: 300 mg via ORAL
  Filled 2014-05-02: qty 1

## 2014-05-02 MED ORDER — OXYCODONE HCL 5 MG PO TABS
5.0000 mg | ORAL_TABLET | Freq: Once | ORAL | Status: AC | PRN
  Administered 2014-05-02: 5 mg via ORAL

## 2014-05-02 MED ORDER — MIDAZOLAM HCL 5 MG/5ML IJ SOLN
INTRAMUSCULAR | Status: DC | PRN
  Administered 2014-05-02: 1 mg via INTRAVENOUS

## 2014-05-02 MED ORDER — METOCLOPRAMIDE HCL 5 MG/ML IJ SOLN: 5.0000 mg | Freq: Three times a day (TID) | INTRAMUSCULAR | Status: DC | PRN

## 2014-05-02 MED ORDER — METHOCARBAMOL 1000 MG/10ML IJ SOLN
500.0000 mg | Freq: Four times a day (QID) | INTRAMUSCULAR | Status: DC | PRN
  Filled 2014-05-02: qty 5

## 2014-05-02 MED ORDER — FENTANYL CITRATE 0.05 MG/ML IJ SOLN
INTRAMUSCULAR | Status: AC
  Filled 2014-05-02: qty 5

## 2014-05-02 MED ORDER — PROMETHAZINE HCL 25 MG/ML IJ SOLN: 6.2500 mg | INTRAMUSCULAR | Status: DC | PRN

## 2014-05-02 MED ORDER — LIDOCAINE HCL (CARDIAC) 20 MG/ML IV SOLN
INTRAVENOUS | Status: DC | PRN
  Administered 2014-05-02: 100 mg via INTRAVENOUS

## 2014-05-02 MED ORDER — ONDANSETRON HCL 4 MG PO TABS
4.0000 mg | ORAL_TABLET | Freq: Four times a day (QID) | ORAL | Status: DC | PRN
  Administered 2014-05-03: 4 mg via ORAL
  Filled 2014-05-02: qty 1

## 2014-05-02 MED ORDER — SODIUM CHLORIDE 0.9 % IV SOLN
INTRAVENOUS | Status: DC | PRN
  Administered 2014-05-02 (×2): via INTRAVENOUS

## 2014-05-02 MED ORDER — INSULIN ASPART 100 UNIT/ML ~~LOC~~ SOLN
3.0000 [IU] | Freq: Three times a day (TID) | SUBCUTANEOUS | Status: DC
Start: 2014-05-03 — End: 2014-05-04
  Administered 2014-05-03: 3 [IU] via SUBCUTANEOUS

## 2014-05-02 MED ORDER — PHENYLEPHRINE 40 MCG/ML (10ML) SYRINGE FOR IV PUSH (FOR BLOOD PRESSURE SUPPORT)
PREFILLED_SYRINGE | INTRAVENOUS | Status: AC
  Filled 2014-05-02: qty 10

## 2014-05-02 SURGICAL SUPPLY — 39 items
BLADE SAW SGTL HD 18.5X60.5X1. (BLADE) ×2 IMPLANT
BLADE SURG 10 STRL SS (BLADE) IMPLANT
BLADE SURG 21 STRL SS (BLADE) ×2 IMPLANT
BNDG COHESIVE 4X5 TAN STRL (GAUZE/BANDAGES/DRESSINGS) ×3 IMPLANT
BNDG GAUZE ELAST 4 BULKY (GAUZE/BANDAGES/DRESSINGS) ×3 IMPLANT
CANISTER SUCTION WELLS/JOHNSON (MISCELLANEOUS) IMPLANT
COVER SURGICAL LIGHT HANDLE (MISCELLANEOUS) ×2 IMPLANT
DRAPE U-SHAPE 47X51 STRL (DRAPES) ×2 IMPLANT
DRSG ADAPTIC 3X8 NADH LF (GAUZE/BANDAGES/DRESSINGS) ×2 IMPLANT
DRSG PAD ABDOMINAL 8X10 ST (GAUZE/BANDAGES/DRESSINGS) ×3 IMPLANT
DURAPREP 26ML APPLICATOR (WOUND CARE) ×2 IMPLANT
ELECT REM PT RETURN 9FT ADLT (ELECTROSURGICAL) ×2
ELECTRODE REM PT RTRN 9FT ADLT (ELECTROSURGICAL) ×1 IMPLANT
FACESHIELD STD STERILE (MASK) ×1 IMPLANT
GAUZE SPONGE 4X4 12PLY STRL (GAUZE/BANDAGES/DRESSINGS) ×2 IMPLANT
GLOVE BIO SURGEON STRL SZ 6.5 (GLOVE) ×1 IMPLANT
GLOVE BIOGEL PI IND STRL 6.5 (GLOVE) IMPLANT
GLOVE BIOGEL PI IND STRL 7.5 (GLOVE) IMPLANT
GLOVE BIOGEL PI IND STRL 9 (GLOVE) ×1 IMPLANT
GLOVE BIOGEL PI INDICATOR 6.5 (GLOVE) ×1
GLOVE BIOGEL PI INDICATOR 7.5 (GLOVE) ×1
GLOVE BIOGEL PI INDICATOR 9 (GLOVE) ×1
GLOVE SURG ORTHO 9.0 STRL STRW (GLOVE) ×2 IMPLANT
GLOVE SURG SS PI 7.0 STRL IVOR (GLOVE) ×1 IMPLANT
GOWN L4 LG 24 PK N/S (GOWN DISPOSABLE) ×1 IMPLANT
GOWN STRL REUS W/ TWL XL LVL3 (GOWN DISPOSABLE) ×1 IMPLANT
GOWN STRL REUS W/TWL XL LVL3 (GOWN DISPOSABLE) ×2
KIT BASIN OR (CUSTOM PROCEDURE TRAY) ×2 IMPLANT
KIT ROOM TURNOVER OR (KITS) ×2 IMPLANT
NS IRRIG 1000ML POUR BTL (IV SOLUTION) ×2 IMPLANT
PACK ORTHO EXTREMITY (CUSTOM PROCEDURE TRAY) ×2 IMPLANT
PAD ARMBOARD 7.5X6 YLW CONV (MISCELLANEOUS) ×3 IMPLANT
SPONGE GAUZE 4X4 12PLY STER LF (GAUZE/BANDAGES/DRESSINGS) ×1 IMPLANT
SPONGE LAP 18X18 X RAY DECT (DISPOSABLE) ×2 IMPLANT
SUT ETHILON 2 0 PSLX (SUTURE) ×5 IMPLANT
SUT SILK 2 0 TIES 10X30 (SUTURE) ×2 IMPLANT
SUT VIC AB 2-0 CTB1 (SUTURE) IMPLANT
TOWEL OR 17X24 6PK STRL BLUE (TOWEL DISPOSABLE) ×2 IMPLANT
TOWEL OR 17X26 10 PK STRL BLUE (TOWEL DISPOSABLE) ×2 IMPLANT

## 2014-05-02 NOTE — Op Note (Signed)
05/02/2014  6:16 PM  PATIENT:  Alvin Daniels    PRE-OPERATIVE DIAGNOSIS:  Abscess, Gangrene Left Ray Amputation  POST-OPERATIVE DIAGNOSIS:  Same  PROCEDURE:  Midfoot Amputation left foot.  SURGEON:  Newt Minion, MD  PHYSICIAN ASSISTANT:None ANESTHESIA:   General  PREOPERATIVE INDICATIONS:  Alvin Daniels is a  42 y.o. male with a diagnosis of Abscess, Gangrene Left Ray Amputation who failed conservative measures and elected for surgical management.    The risks benefits and alternatives were discussed with the patient preoperatively including but not limited to the risks of infection, bleeding, nerve injury, cardiopulmonary complications, the need for revision surgery, among others, and the patient was willing to proceed.  OPERATIVE IMPLANTS: None  OPERATIVE FINDINGS: Severely calcified vessels with good petechial bleeding  OPERATIVE PROCEDURE: Patient is a 43 year old gentleman diabetic insensate neuropathy end-stage renal disease on dialysis who presents with dehiscence and infection of right amputations 34 and 5 of the left foot. Patient presents at this time for revision amputation. Risks and benefits were discussed with the patient through an interpreter also with his wife including infection nonhealing of the wound need for additional surgery. Patient and his wife state they understand and wish to proceed at this time.  Patient was brought to the operating room and underwent a general anesthetic. After adequate levels of anesthesia were obtained patient's left lower extremity was prepped using DuraPrep draped into a sterile field. A timeout was called. A fishmouth incision was made through the midfoot. An oscillating saw was used to amputate his foot through the midfoot. 2-0 nylon was used for hemostasis. The wound was irrigated with normal saline there is no ischemic muscle no abscess. The incision was closed using 2-0 nylon. Sterile compressive dressing was  applied patient was extubated taken to the PACU in stable condition plan for discharge to home tomorrow.

## 2014-05-02 NOTE — Anesthesia Preprocedure Evaluation (Signed)
Anesthesia Evaluation  Patient identified by MRN, date of birth, ID band Patient awake    Reviewed: Allergy & Precautions, NPO status , Patient's Chart, lab work & pertinent test results  History of Anesthesia Complications Negative for: history of anesthetic complications  Airway Mallampati: II  TM Distance: >3 FB Neck ROM: Full    Dental  (+) Poor Dentition, Dental Advisory Given   Pulmonary neg pulmonary ROS,    Pulmonary exam normal       Cardiovascular negative cardio ROS      Neuro/Psych negative psych ROS   GI/Hepatic negative GI ROS, (+) Hepatitis -, C  Endo/Other  diabetes  Renal/GU DialysisRenal disease     Musculoskeletal   Abdominal   Peds  Hematology  (+) HIV,   Anesthesia Other Findings   Reproductive/Obstetrics                             Anesthesia Physical Anesthesia Plan  ASA: III  Anesthesia Plan: General   Post-op Pain Management:    Induction: Intravenous  Airway Management Planned: LMA  Additional Equipment:   Intra-op Plan:   Post-operative Plan: Extubation in OR  Informed Consent: I have reviewed the patients History and Physical, chart, labs and discussed the procedure including the risks, benefits and alternatives for the proposed anesthesia with the patient or authorized representative who has indicated his/her understanding and acceptance.   Dental advisory given  Plan Discussed with:   Anesthesia Plan Comments:         Anesthesia Quick Evaluation

## 2014-05-02 NOTE — Anesthesia Postprocedure Evaluation (Signed)
  Anesthesia Post-op Note  Patient: Alvin Daniels  Procedure(s) Performed: Procedure(s): Midfoot Amputation (Left)  Patient Location: PACU  Anesthesia Type:General  Level of Consciousness: awake and alert   Airway and Oxygen Therapy: Patient Spontanous Breathing  Post-op Pain: none  Post-op Assessment: Post-op Vital signs reviewed  Post-op Vital Signs: Reviewed  Last Vitals:  Filed Vitals:   05/02/14 1917  BP: 90/61  Pulse: 78  Temp:   Resp: 13    Complications: No apparent anesthesia complications

## 2014-05-02 NOTE — Transfer of Care (Signed)
Immediate Anesthesia Transfer of Care Note  Patient: Alvin Daniels  Procedure(s) Performed: Procedure(s): Midfoot Amputation (Left)  Patient Location: PACU  Anesthesia Type:General  Level of Consciousness: awake, alert  and oriented  Airway & Oxygen Therapy: Patient Spontanous Breathing and Patient connected to face mask oxygen  Post-op Assessment: Report given to PACU RN, Post -op Vital signs reviewed and stable and Patient moving all extremities  Post vital signs: Reviewed and stable  Complications: No apparent anesthesia complications

## 2014-05-02 NOTE — H&P (View-Only) (Signed)
Patient ID: Alvin Daniels, male   DOB: 1970/09/11, 43 y.o.   MRN: [id number] Postoperative day 1 status post amputation of ray 3, 4 and 5 left foot. Patient had extensive abscess which involved the entire third fourth and fifth metatarsal heads. Patient may begin physical therapy progressive ambulation nonweightbearing on the left. Discharged to home once safe with ambulation. Keep dressing clean dry and intact I will need to follow-up in the office in one week.

## 2014-05-02 NOTE — Interval H&P Note (Signed)
History and Physical Interval Note:  05/02/2014 5:26 PM  Alvin Daniels  has presented today for surgery, with the diagnosis of Abscess, Gangrene Left Ray Amputation  The various methods of treatment have been discussed with the patient and family. After consideration of risks, benefits and other options for treatment, the patient has consented to  Procedure(s): Midfoot Amputation (Left) as a surgical intervention .  The patient's history has been reviewed, patient examined, no change in status, stable for surgery.  I have reviewed the patient's chart and labs.  Questions were answered to the patient's satisfaction.     Alvin Daniels

## 2014-05-02 NOTE — Anesthesia Procedure Notes (Signed)
Procedure Name: LMA Insertion Date/Time: 05/02/2014 5:42 PM Performed by: Izora Gala Pre-anesthesia Checklist: Patient identified, Emergency Drugs available, Suction available, Patient being monitored and Timeout performed Patient Re-evaluated:Patient Re-evaluated prior to inductionOxygen Delivery Method: Circle system utilized Preoxygenation: Pre-oxygenation with 100% oxygen Intubation Type: IV induction Ventilation: Mask ventilation without difficulty LMA: LMA inserted LMA Size: 4.0 Number of attempts: 1 Placement Confirmation: positive ETCO2 Tube secured with: Tape Dental Injury: Teeth and Oropharynx as per pre-operative assessment

## 2014-05-02 NOTE — Progress Notes (Signed)
Orthopedic Tech Progress Note Patient Details:  Alvin Daniels 18-Apr-1971 [id number]  Ortho Devices Type of Ortho Device: Postop shoe/boot Ortho Device/Splint Location: LLE Ortho Device/Splint Interventions: Ordered, Application   Braulio Bosch 05/02/2014, 6:56 PM

## 2014-05-03 ENCOUNTER — Encounter (HOSPITAL_COMMUNITY): Payer: Self-pay | Admitting: General Practice

## 2014-05-03 DIAGNOSIS — I96 Gangrene, not elsewhere classified: Secondary | ICD-10-CM | POA: Diagnosis not present

## 2014-05-03 LAB — GLUCOSE, CAPILLARY
GLUCOSE-CAPILLARY: 119 mg/dL — AB (ref 70–99)
GLUCOSE-CAPILLARY: 178 mg/dL — AB (ref 70–99)
Glucose-Capillary: 185 mg/dL — ABNORMAL HIGH (ref 70–99)

## 2014-05-03 LAB — MRSA PCR SCREENING: MRSA by PCR: NEGATIVE

## 2014-05-03 MED ORDER — SODIUM CHLORIDE 0.9 % IV SOLN: 100.0000 mL | INTRAVENOUS | Status: DC | PRN

## 2014-05-03 MED ORDER — EMTRICITABINE 200 MG PO CAPS
200.0000 mg | ORAL_CAPSULE | ORAL | Status: DC
  Filled 2014-05-03: qty 1

## 2014-05-03 MED ORDER — OXYCODONE-ACETAMINOPHEN 5-325 MG PO TABS: 1.0000 | ORAL_TABLET | ORAL | Status: DC | PRN

## 2014-05-03 MED ORDER — HEPARIN SODIUM (PORCINE) 1000 UNIT/ML DIALYSIS: 1000.0000 [IU] | INTRAMUSCULAR | Status: DC | PRN

## 2014-05-03 MED ORDER — NEPRO/CARBSTEADY PO LIQD
237.0000 mL | ORAL | Status: DC | PRN
  Filled 2014-05-03: qty 237

## 2014-05-03 MED ORDER — LIDOCAINE HCL (PF) 1 % IJ SOLN: 5.0000 mL | INTRAMUSCULAR | Status: DC | PRN

## 2014-05-03 MED ORDER — ALTEPLASE 2 MG IJ SOLR: 2.0000 mg | Freq: Once | INTRAMUSCULAR | Status: DC | PRN

## 2014-05-03 MED ORDER — PENTAFLUOROPROP-TETRAFLUOROETH EX AERO: 1.0000 "application " | INHALATION_SPRAY | CUTANEOUS | Status: DC | PRN

## 2014-05-03 MED ORDER — LIDOCAINE-PRILOCAINE 2.5-2.5 % EX CREA: 1.0000 "application " | TOPICAL_CREAM | CUTANEOUS | Status: DC | PRN

## 2014-05-03 MED ORDER — EMTRICITABINE 200 MG PO CAPS
200.0000 mg | ORAL_CAPSULE | Freq: Every day | ORAL | Status: DC
  Filled 2014-05-03: qty 1

## 2014-05-03 NOTE — Progress Notes (Signed)
Nurse tech and myself were unable to get oral or axillary temp on pt. Rectal temperature reading around 2300 was 94.3 F. Other vitals at baseline, pt reported he did not feel cold. Covered pt with warmed blankets, turned room temp up, and put warmers on abdomen. Rapid response called by charge nurse, told that warming blanked could be ordered but pt would have to be transferred to stepdown. Notified on call for Dr. Sharol Given, received no new orders from Dr. Erlinda Hong- did not want to transfer. Was instructed to monitor closely and notify if anything changed. Oral reading continued to be in 94.3-94.7 range. At 0100 reading was 97.5. Will continue to monitor.   Alvin Daniels 05/03/2014 1:33 AM

## 2014-05-03 NOTE — Discharge Instructions (Signed)
Keep dressing clean dry and intact. Minimize weightbearing left lower extremity.

## 2014-05-03 NOTE — Discharge Summary (Signed)
  Discharged to home in stable condition. Discharge this morning so patient may received dialysis on his regular schedule this morning. Final diagnosis gangrenous changes left foot.  Surgical procedure left midfoot amputation. Follow-up in the office in 2 weeks. Prescription for Percocet for pain.

## 2014-05-03 NOTE — Progress Notes (Signed)
Discharge instructions gave to pt via her family interpretation. All questions answered. And pt is ready to discharge.

## 2014-05-03 NOTE — Evaluation (Addendum)
Physical Therapy Evaluation and Discharge  Patient Details Name: Norris Wey MRN: [id number] DOB: 1971-02-12 Today's Date: 05/03/2014   History of Present Illness  Pt is a 43 y.o. male s/p Lt midfoot amputation.   Clinical Impression  Patient evaluated by Physical Therapy with no further acute PT needs identified. All education has been completed and the patient has no further questions. Phone interpreter, Angela Nevin ID # [id number] utilized for session. Pt at supervision level for all mobility today with min cues for RW management. Pt reports he has 24/7 (A) upon D/C home. Compliance is questionable regarding chart review, educated to pt importance of compliance and NWB status, pt verbalized understand. See below for any follow-up Physial Therapy or equipment needs. PT is signing off. Thank you for this referral.     Follow Up Recommendations No PT follow up;Supervision/Assistance - 24 hour    Equipment Recommendations  Rolling walker with 5" wheels    Recommendations for Other Services       Precautions / Restrictions Precautions Precautions: Fall Precaution Comments: Post op shoe on Lt LE  Restrictions Weight Bearing Restrictions: Yes LLE Weight Bearing: Non weight bearing      Mobility  Bed Mobility Overal bed mobility: Modified Independent             General bed mobility comments: no physical (A) needed  Transfers Overall transfer level: Needs assistance Equipment used: Rolling walker (2 wheeled) Transfers: Sit to/from Stand Sit to Stand: Supervision         General transfer comment: cues for hand placement and safety with RW; pt demo good ability to maintain NWB status on Lt LE   Ambulation/Gait Ambulation/Gait assistance: Supervision Ambulation Distance (Feet): 50 Feet Assistive device: Rolling walker (2 wheeled) Gait Pattern/deviations: Step-to pattern Gait velocity: decreased Gait velocity interpretation: Below normal speed for age/gender General  Gait Details: supervision for safety only and min cues for RW use; pt demo good ability to maintain NWB status on Lt LE  Stairs            Wheelchair Mobility    Modified Rankin (Stroke Patients Only)       Balance Overall balance assessment: No apparent balance deficits (not formally assessed)                                           Pertinent Vitals/Pain Pain Assessment: No/denies pain    Home Living Family/patient expects to be discharged to:: Private residence Living Arrangements: Other relatives;Other (Comment) (Brother) Available Help at Discharge: Family;Available 24 hours/day Type of Home: Apartment Home Access: Level entry Entrance Stairs-Rails: None   Home Layout: One level Home Equipment: None Additional Comments: phone interpreter utilized    Prior Function Level of Independence: Independent               Hand Dominance   Dominant Hand: Right    Extremity/Trunk Assessment   Upper Extremity Assessment: Defer to OT evaluation           Lower Extremity Assessment: LLE deficits/detail;Generalized weakness   LLE Deficits / Details: lt midfoot amputation  Cervical / Trunk Assessment: Normal  Communication   Communication: Prefers language other than English  Cognition Arousal/Alertness: Awake/alert Behavior During Therapy: WFL for tasks assessed/performed Overall Cognitive Status: Within Functional Limits for tasks assessed  General Comments      Exercises        Assessment/Plan    PT Assessment Patent does not need any further PT services  PT Diagnosis Difficulty walking;Acute pain   PT Problem List Pain;Decreased knowledge of use of DME;Decreased safety awareness;Decreased mobility;Decreased balance  PT Treatment Interventions DME instruction;Gait training;Functional mobility training;Therapeutic activities;Therapeutic exercise;Balance training;Patient/family education   PT  Goals (Current goals can be found in the Care Plan section) Acute Rehab PT Goals Patient Stated Goal: to go to dialysis  PT Goal Formulation: All assessment and education complete, DC therapy    Frequency     Barriers to discharge        Co-evaluation               End of Session Equipment Utilized During Treatment: Gait belt;Other (comment) Activity Tolerance: Patient tolerated treatment well Patient left: in chair;with call bell/phone within reach Nurse Communication: Mobility status    Functional Assessment Tool Used: clinical judgement  Functional Limitation: Mobility: Walking and moving around Mobility: Walking and Moving Around Current Status [id number]): At least 1 percent but less than 20 percent impaired, limited or restricted Mobility: Walking and Moving Around Goal Status [id number]): At least 1 percent but less than 20 percent impaired, limited or restricted Mobility: Walking and Moving Around Discharge Status [phone]): At least 1 percent but less than 20 percent impaired, limited or restricted    Time: 0848-0902 PT Time Calculation (min) (ACUTE ONLY): 14 min   Charges:   PT Evaluation $Initial PT Evaluation Tier I: 1 Procedure PT Treatments $Gait Training: 8-22 mins   PT G Codes:   Functional Assessment Tool Used: clinical judgement  Functional Limitation: Mobility: Walking and moving around    Dale, [place], [place]  [phone] 05/03/2014, 9:24 AM

## 2014-05-03 NOTE — Progress Notes (Signed)
CARE MANAGEMENT NOTE 05/03/2014  Patient:  Alvin Daniels, Alvin Daniels   Account Number:  [id number]  Date Initiated:  05/03/2014  Documentation initiated by:  [facility]  Subjective/Objective Assessment:   s/p left midfoot amputation     Action/Plan:   PT eval-recommended rolling walker   Anticipated DC Date:  05/03/2014   Anticipated DC Plan:  [place]  CM consult      PAC Choice  DURABLE MEDICAL EQUIPMENT   Choice offered to / List presented to:     DME arranged  Vassie Moselle      DME agency  [place].        Status of service:  Completed, signed off Medicare Important Message given?   (If response is "NO", the following Medicare IM given date fields will be blank) Date Medicare IM given:   Medicare IM given by:   Date Additional Medicare IM given:   Additional Medicare IM given by:    Discharge Disposition:  HOME/SELF CARE  Per UR Regulation:    If discussed at Long Length of Stay Meetings, dates discussed:    Comments:  05/03/14 PT recommended rolling walker, contacted Jermaine at [facility] and requested rolling walker be delivered to patient's room. Fuller Plan RN, BSN, CCM

## 2014-05-11 ENCOUNTER — Other Ambulatory Visit: Payer: Self-pay | Admitting: Infectious Diseases

## 2014-05-11 DIAGNOSIS — B20 Human immunodeficiency virus [HIV] disease: Secondary | ICD-10-CM

## 2014-05-16 ENCOUNTER — Ambulatory Visit (INDEPENDENT_AMBULATORY_CARE_PROVIDER_SITE_OTHER): Payer: Self-pay | Admitting: Infectious Diseases

## 2014-05-16 ENCOUNTER — Encounter: Payer: Self-pay | Admitting: Infectious Diseases

## 2014-05-16 VITALS — BP 81/45 | HR 91 | Temp 97.7°F | Wt 109.0 lb

## 2014-05-16 DIAGNOSIS — B20 Human immunodeficiency virus [HIV] disease: Secondary | ICD-10-CM

## 2014-05-16 DIAGNOSIS — N186 End stage renal disease: Secondary | ICD-10-CM

## 2014-05-16 DIAGNOSIS — S91002D Unspecified open wound, left ankle, subsequent encounter: Secondary | ICD-10-CM

## 2014-05-16 DIAGNOSIS — S81802D Unspecified open wound, left lower leg, subsequent encounter: Secondary | ICD-10-CM

## 2014-05-16 DIAGNOSIS — Z79899 Other long term (current) drug therapy: Secondary | ICD-10-CM

## 2014-05-16 DIAGNOSIS — B182 Chronic viral hepatitis C: Secondary | ICD-10-CM

## 2014-05-16 DIAGNOSIS — Z113 Encounter for screening for infections with a predominantly sexual mode of transmission: Secondary | ICD-10-CM

## 2014-05-16 DIAGNOSIS — S81002D Unspecified open wound, left knee, subsequent encounter: Secondary | ICD-10-CM

## 2014-05-16 NOTE — Assessment & Plan Note (Signed)
Genotype 1a. Needs elastogram then his staging will be complete.

## 2014-05-16 NOTE — Progress Notes (Signed)
   Subjective:    Patient ID: Alvin Daniels, male    DOB: 11-03-1970, 44 y.o.   MRN: [id number]  HPI 44 yo M with DM, ESRD, Hep C and HIV+. Was in hospital 12-4 to 04-22-13 due to repeat episode of hypotension, falls. He was started on midodrine. Was previously on DRVr/TFV/EMT. He also has a hx of a RLE wound.  He came to ED on 12-7 with black L 4th toe for 1 week. He then developed purulent d/c and worsening pain on day prior to admissoin. He was started on vanco/zosyn then changed to vanco/cefepime/flagyl. His hospital course was also notable for NSVT and hypotension.  He underwent amputation of L 3/4/5 toes and metatarsals on 12-9. He also had drainage of a large abscess, no Cx sent. He was d/c home on levaquin for 1 month. He returned to hospital on 12-21 when he developed gangrene and required mid- foot amputation.  He is here for f/u today. He appears to have his original dressing on . No fevers. No problems with walking.  Has been taking his art.  Has been going to HD. He is not sure when his orthopedist appt is.   HIV 1 RNA QUANT (copies/mL)  Date Value  04/19/2014 37*  02/23/2014 59*  07/26/2013 <20   CD4 T CELL ABS (/uL)  Date Value  04/19/2014 170*  02/23/2014 260*  04/15/2013 260*      Review of Systems     Objective:   Physical Exam  Constitutional: He appears well-developed and well-nourished.  HENT:  Mouth/Throat: No oropharyngeal exudate.  Eyes: EOM are normal. Pupils are equal, round, and reactive to light.  Neck: Neck supple.  Cardiovascular: Normal rate, regular rhythm and normal heart sounds.   Pulmonary/Chest: Effort normal and breath sounds normal.  Abdominal: Soft. Bowel sounds are normal. There is no tenderness.  Musculoskeletal:       Feet:  Lymphadenopathy:    He has no cervical adenopathy.          Assessment & Plan:

## 2014-05-16 NOTE — Assessment & Plan Note (Signed)
He appears to be doing well, had slight drop in his CD4 in hospital. Will see him back in 4-6 weeks. Recheck his labs at that time. Recheck Hep B as well.

## 2014-05-16 NOTE — Assessment & Plan Note (Signed)
States he has been going to HD without difficulty. His graft is clean. +bruit.

## 2014-05-16 NOTE — Progress Notes (Signed)
Dressing removed . Dried dark blood present and gauge stuck to site. Sterile saline used to remove dressing.   Large bulky non adherent dressing applied to left lower foot amputation.  Suture site is bleeding bright red along withdark red blood and skin does not appear to closed. Swelling is present at site and and protrudes around sutures.  Patient advised to return for dressing change in 4 days.  Laverle Patter, RN

## 2014-05-16 NOTE — Assessment & Plan Note (Addendum)
Not clear his wound is healing appropriately. Need to get him into ortho for f/u asap.  He will return to ID clinic on 1-8 for dressing change.

## 2014-06-06 ENCOUNTER — Other Ambulatory Visit: Payer: Self-pay | Admitting: Infectious Diseases

## 2014-06-10 ENCOUNTER — Encounter (HOSPITAL_COMMUNITY): Payer: Self-pay | Admitting: Physical Medicine and Rehabilitation

## 2014-06-10 ENCOUNTER — Emergency Department (HOSPITAL_COMMUNITY)
Admission: EM | Admit: 2014-06-10 | Discharge: 2014-06-10 | Disposition: A | Discharge status: Home / Self Care | Payer: Medicaid Other | Attending: Emergency Medicine | Admitting: Emergency Medicine

## 2014-06-10 ENCOUNTER — Emergency Department (HOSPITAL_COMMUNITY): Payer: Medicaid Other

## 2014-06-10 DIAGNOSIS — L03116 Cellulitis of left lower limb: Secondary | ICD-10-CM | POA: Insufficient documentation

## 2014-06-10 DIAGNOSIS — Z8679 Personal history of other diseases of the circulatory system: Secondary | ICD-10-CM | POA: Diagnosis not present

## 2014-06-10 DIAGNOSIS — Z89422 Acquired absence of other left toe(s): Secondary | ICD-10-CM | POA: Diagnosis not present

## 2014-06-10 DIAGNOSIS — E1140 Type 2 diabetes mellitus with diabetic neuropathy, unspecified: Secondary | ICD-10-CM | POA: Diagnosis not present

## 2014-06-10 DIAGNOSIS — Z794 Long term (current) use of insulin: Secondary | ICD-10-CM | POA: Diagnosis not present

## 2014-06-10 DIAGNOSIS — Z8619 Personal history of other infectious and parasitic diseases: Secondary | ICD-10-CM | POA: Insufficient documentation

## 2014-06-10 DIAGNOSIS — Z992 Dependence on renal dialysis: Secondary | ICD-10-CM | POA: Diagnosis not present

## 2014-06-10 DIAGNOSIS — E1165 Type 2 diabetes mellitus with hyperglycemia: Secondary | ICD-10-CM | POA: Insufficient documentation

## 2014-06-10 DIAGNOSIS — Z21 Asymptomatic human immunodeficiency virus [HIV] infection status: Secondary | ICD-10-CM | POA: Diagnosis not present

## 2014-06-10 DIAGNOSIS — Z79899 Other long term (current) drug therapy: Secondary | ICD-10-CM | POA: Diagnosis not present

## 2014-06-10 DIAGNOSIS — N186 End stage renal disease: Secondary | ICD-10-CM | POA: Diagnosis not present

## 2014-06-10 DIAGNOSIS — L03119 Cellulitis of unspecified part of limb: Secondary | ICD-10-CM

## 2014-06-10 DIAGNOSIS — Z8614 Personal history of Methicillin resistant Staphylococcus aureus infection: Secondary | ICD-10-CM | POA: Insufficient documentation

## 2014-06-10 DIAGNOSIS — R739 Hyperglycemia, unspecified: Secondary | ICD-10-CM

## 2014-06-10 DIAGNOSIS — M79672 Pain in left foot: Secondary | ICD-10-CM | POA: Diagnosis present

## 2014-06-10 LAB — CBC WITH DIFFERENTIAL/PLATELET
Basophils Absolute: 0 10*3/uL (ref 0.0–0.1)
Basophils Relative: 1 % (ref 0–1)
EOS PCT: 1 % (ref 0–5)
Eosinophils Absolute: 0.1 10*3/uL (ref 0.0–0.7)
HCT: 35.3 % — ABNORMAL LOW (ref 39.0–52.0)
Hemoglobin: 11.5 g/dL — ABNORMAL LOW (ref 13.0–17.0)
LYMPHS ABS: 1.2 10*3/uL (ref 0.7–4.0)
LYMPHS PCT: 15 % (ref 12–46)
MCH: 30.7 pg (ref 26.0–34.0)
MCHC: 32.6 g/dL (ref 30.0–36.0)
MCV: 94.4 fL (ref 78.0–100.0)
MONO ABS: 0.6 10*3/uL (ref 0.1–1.0)
Monocytes Relative: 7 % (ref 3–12)
NEUTROS PCT: 76 % (ref 43–77)
Neutro Abs: 6.1 10*3/uL (ref 1.7–7.7)
PLATELETS: 191 10*3/uL (ref 150–400)
RBC: 3.74 MIL/uL — ABNORMAL LOW (ref 4.22–5.81)
RDW: 13.8 % (ref 11.5–15.5)
WBC: 8 10*3/uL (ref 4.0–10.5)

## 2014-06-10 LAB — COMPREHENSIVE METABOLIC PANEL
ALT: 13 U/L (ref 0–53)
ANION GAP: 4 — AB (ref 5–15)
AST: 27 U/L (ref 0–37)
Albumin: 2.6 g/dL — ABNORMAL LOW (ref 3.5–5.2)
Alkaline Phosphatase: 85 U/L (ref 39–117)
BUN: 20 mg/dL (ref 6–23)
CALCIUM: 8.8 mg/dL (ref 8.4–10.5)
CO2: 33 mmol/L — ABNORMAL HIGH (ref 19–32)
CREATININE: 6.11 mg/dL — AB (ref 0.50–1.35)
Chloride: 93 mmol/L — ABNORMAL LOW (ref 96–112)
GFR calc Af Amer: 12 mL/min — ABNORMAL LOW (ref >90)
GFR, EST NON AFRICAN AMERICAN: 10 mL/min — AB (ref >90)
GLUCOSE: 270 mg/dL — AB (ref 70–99)
Potassium: 4.2 mmol/L (ref 3.5–5.1)
Sodium: 130 mmol/L — ABNORMAL LOW (ref 135–145)
TOTAL PROTEIN: 8.2 g/dL (ref 6.0–8.3)
Total Bilirubin: 0.6 mg/dL (ref 0.3–1.2)

## 2014-06-10 LAB — SEDIMENTATION RATE: SED RATE: 84 mm/h — AB (ref 0–16)

## 2014-06-10 LAB — I-STAT CG4 LACTIC ACID, ED: LACTIC ACID, VENOUS: 1.14 mmol/L (ref 0.5–2.0)

## 2014-06-10 LAB — CBG MONITORING, ED: Glucose-Capillary: 306 mg/dL — ABNORMAL HIGH (ref 70–99)

## 2014-06-10 MED ORDER — MORPHINE SULFATE 4 MG/ML IJ SOLN
4.0000 mg | Freq: Once | INTRAMUSCULAR | Status: AC
  Administered 2014-06-10: 4 mg via INTRAVENOUS
  Filled 2014-06-10: qty 1

## 2014-06-10 MED ORDER — DOXYCYCLINE HYCLATE 100 MG PO CAPS: 100.0000 mg | ORAL_CAPSULE | Freq: Two times a day (BID) | ORAL | Status: DC

## 2014-06-10 MED ORDER — INSULIN ASPART 100 UNIT/ML ~~LOC~~ SOLN
5.0000 [IU] | Freq: Once | SUBCUTANEOUS | Status: AC
  Administered 2014-06-10: 5 [IU] via SUBCUTANEOUS
  Filled 2014-06-10: qty 1

## 2014-06-10 MED ORDER — DOXYCYCLINE HYCLATE 100 MG PO TABS
100.0000 mg | ORAL_TABLET | Freq: Once | ORAL | Status: AC
  Administered 2014-06-10: 100 mg via ORAL
  Filled 2014-06-10: qty 1

## 2014-06-10 NOTE — ED Notes (Signed)
Dressing applied to left foot prior to patient leaving, placed back in ortho shoe

## 2014-06-10 NOTE — Discharge Instructions (Signed)
Take doxycycline twice a day for a week.   See Dr. Sharol Given next week   Return to ER if you have severe pain, fever, worse swelling.

## 2014-06-10 NOTE — ED Notes (Addendum)
Pt reports L foot pain and swelling. States he had all digits amputated x1 month ago, now states increased pain. Pt is alert and oriented x4. Hemodialysis patient, treatments on Tuesday, Thursday and Saturday, fistula L arm.

## 2014-06-10 NOTE — ED Notes (Signed)
MD at bedside. 

## 2014-06-10 NOTE — ED Provider Notes (Signed)
CSN: [id number]     Arrival date & time 06/10/14  1138 History   First MD Initiated Contact with Patient 06/10/14 1552     Chief Complaint  Patient presents with  . Foot Pain     (Consider location/radiation/quality/duration/timing/severity/associated sxs/prior Treatment) The history is provided by the patient. The history is limited by a language barrier. A language interpreter was used Administrator, arts).  Alvin Daniels is a 44 y.o. male hx of HIV (CD 4 was 170 a month ago), ESRD on HD (last HD was yesterday), DM, osteomyelitis of the foot s/p amputation a month ago here with worsening left foot swelling and redness. He has been going to his doctor and was on antibiotics until about 2 days ago. He also has been going to wound care. However over the last 3-4 days, he has progressive swelling of the left foot. Denies purulent discharge. Denies fever or chills.    Past Medical History  Diagnosis Date  . Hepatitis C   . MRSA infection   . Pancreatitis   . Chronic diarrhea   . HIV INFECTION 06/27/2010    Qualifier: Diagnosis of  By: Nickola Major CMA ( [place]), Geni Bers    . Hypotension 06/02/2012  . Metabolic bone disease 06/19/7822  . Normocytic anemia 06/17/2012  . Severe protein-calorie malnutrition 06/17/2012  . Diabetic neuropathy   . ESRD (end stage renal disease) on dialysis     "TTS; don't remember street name" (05/03/2014)  . Renal disorder   . Uncontrolled diabetes mellitus with complications 2/35/3614    Annotation: uncontrolled Qualifier: Diagnosis of  By: Nickola Major CMA ( AAMA), Geni Bers     Past Surgical History  Procedure Laterality Date  . Av fistula placement Left   . Amputation Left 04/20/2014    Procedure: 3rd toe amputation, 4th Toe Amputation,  5th Toe Amputation;  Surgeon: Newt Minion, MD;  Location: [facility];  Service: Orthopedics;  Laterality: Left;  . 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 Amputation Left 05/02/2014    Procedure:  Midfoot Amputation;  Surgeon: Newt Minion, MD;  Location: [facility];  Service: Orthopedics;  Laterality: Left;   Family History  Problem Relation Age of Onset  . Diabetes Mother   . Diabetes Father    History  Substance Use Topics  . Smoking status: Never Smoker   . Smokeless tobacco: Never Used  . Alcohol Use: No    Review of Systems  Musculoskeletal:       L foot swelling, pain   All other systems reviewed and are negative.     Allergies  Review of patient's allergies indicates no known allergies.  Home Medications   Prior to Admission medications   Medication Sig Start Date End Date Taking? Authorizing Provider  docusate sodium 100 MG CAPS Take 100 mg by mouth 2 (two) times daily. Patient not taking: Reported on 05/02/2014 04/22/14   Dimas Chyle, MD  EMTRIVA 200 MG capsule TAKE 1 CAPSULE BY MOUTH EVERY TUESDAY, AND SATURDAY FOLLOWING DIALYSIS 05/11/14   Campbell Riches, MD  insulin lispro protamine-lispro (HUMALOG MIX 75/25) (75-25) 100 UNIT/ML SUSP injection Inject 15 Units into the skin 2 (two) times daily with a meal. 15 units in the am and 15 units in the pm 10/25/13   Lorayne Marek, MD  levofloxacin (LEVAQUIN) 500 MG tablet Take 1 tablet (500 mg total) by mouth every other day. 04/25/14   Leone Haven, MD  loperamide (IMODIUM A-D) 2 MG tablet Take  1 tablet (2 mg total) by mouth 4 (four) times daily as needed for diarrhea or loose stools. Patient not taking: Reported on 05/02/2014 02/23/14   Campbell Riches, MD  methocarbamol (ROBAXIN) 500 MG tablet Take 1 tablet (500 mg total) by mouth every 6 (six) hours as needed for muscle spasms. Patient not taking: Reported on 05/02/2014 04/22/14   Dimas Chyle, MD  midodrine (PROAMATINE) 10 MG tablet Take 1 tablet (10 mg total) by mouth 3 (three) times daily with meals. Patient not taking: Reported on 05/02/2014 04/22/14   Dimas Chyle, MD  NORVIR 100 MG TABS tablet TAKE 1 TABLET BY MOUTH EVERY DAY WITH BREAKFAST**MUST  TAKE WITH PREZISTA 05/11/14   Campbell Riches, MD  ondansetron (ZOFRAN) 4 MG tablet Take 1 tablet (4 mg total) by mouth every 6 (six) hours as needed for nausea. Patient not taking: Reported on 05/02/2014 04/22/14   Dimas Chyle, MD  oxyCODONE-acetaminophen (PERCOCET/ROXICET) 5-325 MG per tablet Take 1 tablet by mouth every 4 (four) hours as needed for moderate pain or severe pain. 04/24/14   Leone Haven, MD  oxyCODONE-acetaminophen (ROXICET) 5-325 MG per tablet Take 1 tablet by mouth every 4 (four) hours as needed for severe pain. 05/03/14   Newt Minion, MD  polyethylene glycol (MIRALAX / Floria Raveling) packet Take 17 g by mouth daily as needed for moderate constipation or severe constipation. Patient not taking: Reported on 05/02/2014 04/22/14   Dimas Chyle, MD  PREZISTA 800 MG tablet TAKE 1 TABLET BY MOUTH EVERY DAY WITH BREAKFAST 05/11/14   Campbell Riches, MD  sulfamethoxazole-trimethoprim (BACTRIM,SEPTRA) 400-80 MG per tablet Take 1 tablet by mouth daily. Patient not taking: Reported on 05/02/2014 04/24/14   Alveda Reasons, MD  tenofovir Veva Holes) 300 MG tablet One tablet by mouth weekly every Sunday. 01/27/14   Carlyle Basques, MD  VIREAD 300 MG tablet TAKE 1 TABLET BY MOUTH EVERY WEEK ON SUNDAY 06/07/14   Campbell Riches, MD   BP 109/72 mmHg  Pulse 78  Temp(Src) 97.8 F (36.6 C) (Oral)  Resp 18  SpO2 98% Physical Exam  Constitutional: He is oriented to person, place, and time.  Chronically ill   HENT:  Head: Normocephalic.  Mouth/Throat: Oropharynx is clear and moist.  Eyes: Conjunctivae are normal. Pupils are equal, round, and reactive to light.  Neck: Normal range of motion.  Cardiovascular: Normal rate, regular rhythm and normal heart sounds.   Pulmonary/Chest: Effort normal and breath sounds normal. No respiratory distress. He has no wheezes. He has no rales.  Abdominal: Soft. Bowel sounds are normal. He exhibits no distension. There is no tenderness. There is no  rebound and no guarding.  Musculoskeletal:  L foot red with previous scars. Diminished pulses. See picture   Neurological: He is alert and oriented to person, place, and time.  Skin: Skin is warm.  Psychiatric: He has a normal mood and affect. His behavior is normal. Judgment normal.  Nursing note and vitals reviewed.   ED Course  Procedures (including critical care time)     Labs Review Labs Reviewed  CBC WITH DIFFERENTIAL/PLATELET - Abnormal; Notable for the following:    RBC 3.74 (*)    Hemoglobin 11.5 (*)    HCT 35.3 (*)    All other components within normal limits  COMPREHENSIVE METABOLIC PANEL - Abnormal; Notable for the following:    Sodium 130 (*)    Chloride 93 (*)    CO2 33 (*)    Glucose, Bld 270 (*)  Creatinine, Ser 6.11 (*)    Albumin 2.6 (*)    GFR calc non Af Amer 10 (*)    GFR calc Af Amer 12 (*)    Anion gap 4 (*)    All other components within normal limits  SEDIMENTATION RATE - Abnormal; Notable for the following:    Sed Rate 84 (*)    All other components within normal limits  CBG MONITORING, ED - Abnormal; Notable for the following:    Glucose-Capillary 306 (*)    All other components within normal limits  I-STAT CG4 LACTIC ACID, ED    Imaging Review Dg Foot Complete Left  06/10/2014   CLINICAL DATA:  redness and swelling left foot stump  EXAM: LEFT FOOT - COMPLETE 3+ VIEW  COMPARISON:  None.  FINDINGS: Three views of the left foot submitted. The patient is status post amputation of distal foot at the level of tarsal metatarsal joints region. There is no evidence of bony erosion known bone destruction to suggest definite osteomyelitis. No acute fracture or subluxation. Atherosclerotic vascular calcifications are noted.  IMPRESSION: No acute fracture or subluxation. Status post amputation of distal foot at the level of tarsal metatarsal joints. No definite evidence of osteomyelitis.   Electronically Signed   By: Lahoma Crocker M.D.   On: 06/10/2014 16:36       EKG Interpretation None      MDM   Final diagnoses:  None   Alvin Daniels is a 44 y.o. male here with L foot swelling and pain. Consider osteo vs cellulitis. Will get xray, ESR.   6:50 PM ESR 80, dec from 100. WBC nl. Xray showed no osteo. Consulted Dr. Ninfa [facility], covering for Dr. Sharol Given, who said that eventually he may need an BKA but given that labs improved, can try course of doxy and f/u outpatient.     Wandra Arthurs, MD 06/10/14 [phone]

## 2014-06-13 ENCOUNTER — Telehealth: Payer: Self-pay | Admitting: *Deleted

## 2014-06-13 ENCOUNTER — Other Ambulatory Visit (INDEPENDENT_AMBULATORY_CARE_PROVIDER_SITE_OTHER): Payer: Medicaid Other

## 2014-06-13 DIAGNOSIS — B20 Human immunodeficiency virus [HIV] disease: Secondary | ICD-10-CM

## 2014-06-13 DIAGNOSIS — Z113 Encounter for screening for infections with a predominantly sexual mode of transmission: Secondary | ICD-10-CM

## 2014-06-13 DIAGNOSIS — N186 End stage renal disease: Secondary | ICD-10-CM

## 2014-06-13 DIAGNOSIS — Z79899 Other long term (current) drug therapy: Secondary | ICD-10-CM

## 2014-06-13 LAB — CBC
HCT: 34.5 % — ABNORMAL LOW (ref 39.0–52.0)
Hemoglobin: 11 g/dL — ABNORMAL LOW (ref 13.0–17.0)
MCH: 30.5 pg (ref 26.0–34.0)
MCHC: 31.9 g/dL (ref 30.0–36.0)
MCV: 95.6 fL (ref 78.0–100.0)
MPV: 9.1 fL (ref 8.6–12.4)
Platelets: 209 10*3/uL (ref 150–400)
RBC: 3.61 MIL/uL — ABNORMAL LOW (ref 4.22–5.81)
RDW: 13.9 % (ref 11.5–15.5)
WBC: 5.7 10*3/uL (ref 4.0–10.5)

## 2014-06-13 LAB — LIPID PANEL
CHOL/HDL RATIO: 2.7 ratio
Cholesterol: 81 mg/dL (ref 0–200)
HDL: 30 mg/dL — ABNORMAL LOW (ref >39)
LDL Cholesterol: 38 mg/dL (ref 0–99)
Triglycerides: 67 mg/dL (ref <150)
VLDL: 13 mg/dL (ref 0–40)

## 2014-06-13 LAB — COMPREHENSIVE METABOLIC PANEL
ALK PHOS: 87 U/L (ref 39–117)
ALT: 9 U/L (ref 0–53)
AST: 21 U/L (ref 0–37)
Albumin: 2.7 g/dL — ABNORMAL LOW (ref 3.5–5.2)
BILIRUBIN TOTAL: 0.4 mg/dL (ref 0.2–1.2)
BUN: 26 mg/dL — ABNORMAL HIGH (ref 6–23)
CO2: 23 mEq/L (ref 19–32)
CREATININE: 6.78 mg/dL — AB (ref 0.50–1.35)
Calcium: 8.4 mg/dL (ref 8.4–10.5)
Chloride: 97 mEq/L (ref 96–112)
Glucose, Bld: 227 mg/dL — ABNORMAL HIGH (ref 70–99)
POTASSIUM: 4.9 meq/L (ref 3.5–5.3)
SODIUM: 130 meq/L — AB (ref 135–145)
TOTAL PROTEIN: 7 g/dL (ref 6.0–8.3)

## 2014-06-13 NOTE — Telephone Encounter (Signed)
Patient came for labs today, asked to speak with a nurse regarding his foot.  RN unwrapped patient's foot, found it to be hot to the touch, swollen and with an old dressing.  Patient reports through an interpreter that he went to the ED on 1/29 and was told his foot is fine.  He had not changed the dressing until this morning. Patient states that he was told to wash it with water.  He does not have any dressing supplies at home.  Patient is scheduled with Dr. Sharol Given on 2/12 at 7:30 am, although patient is unaware of this appointment.  He is requesting to see Dr. Sharol Given today via his interpreter.  RN advised patient that Dr. Sharol Given is not at this office, that he has appointments tomorrow and they are willing to work around his dialysis.  Patient states that the ED said his foot was fine, he will just wait until 2/12.  RN contacted Dr. Jess Barters office, they offered him multiple time slots for tomorrow, but patient refused, stating that Tuesday is his dialysis day. RN advised patient that he should not wait, asked Dr. [name] to please evaluate and speak with the patient.  Dr. [name] advised patient to see Dr. Sharol Given tomorrow, patient agreed.  Pt is scheduled with Dr. Sharol Given tomorrow 2/2 at 3:00, after dialysis.   Upon getting ready to leave, patient stated that he has no ride tomorrow to Dr. Jess Barters office.  RN spoke with Amy at [facility] to see if they could provide transportation services.  Upon discussion, it was also discovered that the patient needs to renew his ADAP to continue to receive his HIV medications and that the patient has not submitted his [employer] paperwork.  Amy will submit the now-signed ADAP application, advised patient that Joseph Art, his case manager, will contact him for the [place] application and to assist with other services.  Patient agreed, gave updated phone contact information.    Patient then stated he needed a ride home today, asked if we could get him there.  Patient used [facility] to get  here today, was unaware that they would provide transportation home.  RN contacted [facility], they will come back to pick up patient today.  RN coordinated transportation from patient's home tomorrow morning --> dialysis --> Dr. Jess Barters office (appointment now at 12:30 to accommodate dialysis and transportation needs) --> home after the appointment.  RN notified patient via [employer] [phone], Johnsie Cancel.  Patient accepted the appointment.  He is unable to bring someone with him tomorrow who speaks [place], RN advised patient to ask for the telephonic interpreter services. RN made sure again that the patient was aware that [employer] would be there to pick him up and transport to each appointment and then return him home.

## 2014-06-14 ENCOUNTER — Other Ambulatory Visit (HOSPITAL_COMMUNITY): Payer: Self-pay | Admitting: Orthopedic Surgery

## 2014-06-14 LAB — T-HELPER CELL (CD4) - (RCID CLINIC ONLY)
CD4 % Helper T Cell: 18 % — ABNORMAL LOW (ref 33–55)
CD4 T CELL ABS: 140 /uL — AB (ref 400–2700)

## 2014-06-14 LAB — HEPATITIS B SURFACE ANTIBODY,QUALITATIVE

## 2014-06-14 LAB — RPR

## 2014-06-15 LAB — HIV-1 RNA QUANT-NO REFLEX-BLD: HIV-1 RNA Quant, Log: <1.3 {Log} (ref <1.30)

## 2014-06-16 ENCOUNTER — Encounter (HOSPITAL_COMMUNITY): Payer: Self-pay | Admitting: *Deleted

## 2014-06-16 MED ORDER — CEFAZOLIN SODIUM-DEXTROSE 2-3 GM-% IV SOLR
2.0000 g | INTRAVENOUS | Status: AC
  Administered 2014-06-17: 2 g via INTRAVENOUS
  Filled 2014-06-16: qty 50

## 2014-06-16 NOTE — Progress Notes (Signed)
Used Alisia Ferrari, Alex # [phone] contacted pt for pre-op call. Pt does not know the names of his pills (he can't read what is on the bottle) he knows the colors. I instructed him to bring all the pills he's taking in their individual bottles so we can make sure we have the correct medications in his chart. He wanted to just put one of each in a bottle and bring them but I told him not to do that. I also instructed him not to take ANY of his medications in the AM since he doesn't know which ones he should take. Pt does not have transportation to the hospital, he thinks a friend might be able to bring him, but doubts that he will. Won't know until 10 pm tonight. I asked him if he had tried [facility] and he stated that they don't speak Spanish so he can't arrange a ride with them. He can't walk to the bus stop due to pain in his foot. I told him I would try to contact [employer] to arrange for pickup and would call him back. I called [facility] and they had already closed for the day. I then called and spoke with Jeral Fruit, Social Worker in the ED and told her the situation. She states she will pass this on to the social worker that comes in at 7 AM tomorrow Jarrett Soho) and she will have arrange either [employer] or have taxi pick pt up and bring him to the hospital and provide a taxi voucher. She will have a Spanish interpreter call pt and give him the details in the AM. I called pt back via [employer], [place] # [phone] and explained this to the pt. He voiced understanding and thanks.

## 2014-06-17 ENCOUNTER — Encounter (HOSPITAL_COMMUNITY)
Admission: RE | Disposition: A | Discharge status: Home Health | Payer: Self-pay | Source: Ambulatory Visit | Attending: Orthopedic Surgery

## 2014-06-17 ENCOUNTER — Inpatient Hospital Stay (HOSPITAL_COMMUNITY): Payer: Medicaid Other | Admitting: Anesthesiology

## 2014-06-17 ENCOUNTER — Inpatient Hospital Stay (HOSPITAL_COMMUNITY)
Admission: RE | Admit: 2014-06-17 | Discharge: 2014-06-20 | DRG: 040 | Disposition: A | Discharge status: Home Health | Payer: Medicaid Other | Source: Ambulatory Visit | Attending: Orthopedic Surgery | Admitting: Orthopedic Surgery

## 2014-06-17 DIAGNOSIS — T8781 Dehiscence of amputation stump: Secondary | ICD-10-CM | POA: Diagnosis present

## 2014-06-17 DIAGNOSIS — E10649 Type 1 diabetes mellitus with hypoglycemia without coma: Secondary | ICD-10-CM | POA: Diagnosis not present

## 2014-06-17 DIAGNOSIS — M868X6 Other osteomyelitis, lower leg: Secondary | ICD-10-CM | POA: Diagnosis present

## 2014-06-17 DIAGNOSIS — Z833 Family history of diabetes mellitus: Secondary | ICD-10-CM | POA: Diagnosis not present

## 2014-06-17 DIAGNOSIS — Z8614 Personal history of Methicillin resistant Staphylococcus aureus infection: Secondary | ICD-10-CM | POA: Diagnosis not present

## 2014-06-17 DIAGNOSIS — L97529 Non-pressure chronic ulcer of other part of left foot with unspecified severity: Secondary | ICD-10-CM | POA: Diagnosis present

## 2014-06-17 DIAGNOSIS — I739 Peripheral vascular disease, unspecified: Secondary | ICD-10-CM | POA: Diagnosis present

## 2014-06-17 DIAGNOSIS — E1022 Type 1 diabetes mellitus with diabetic chronic kidney disease: Secondary | ICD-10-CM | POA: Diagnosis present

## 2014-06-17 DIAGNOSIS — N2581 Secondary hyperparathyroidism of renal origin: Secondary | ICD-10-CM | POA: Diagnosis not present

## 2014-06-17 DIAGNOSIS — Z21 Asymptomatic human immunodeficiency virus [HIV] infection status: Secondary | ICD-10-CM | POA: Diagnosis present

## 2014-06-17 DIAGNOSIS — Z89432 Acquired absence of left foot: Secondary | ICD-10-CM

## 2014-06-17 DIAGNOSIS — B1920 Unspecified viral hepatitis C without hepatic coma: Secondary | ICD-10-CM | POA: Diagnosis present

## 2014-06-17 DIAGNOSIS — D649 Anemia, unspecified: Secondary | ICD-10-CM | POA: Diagnosis present

## 2014-06-17 DIAGNOSIS — Z794 Long term (current) use of insulin: Secondary | ICD-10-CM | POA: Diagnosis not present

## 2014-06-17 DIAGNOSIS — I120 Hypertensive chronic kidney disease with stage 5 chronic kidney disease or end stage renal disease: Secondary | ICD-10-CM | POA: Diagnosis not present

## 2014-06-17 DIAGNOSIS — Y835 Amputation of limb(s) as the cause of abnormal reaction of the patient, or of later complication, without mention of misadventure at the time of the procedure: Secondary | ICD-10-CM | POA: Diagnosis present

## 2014-06-17 DIAGNOSIS — E1040 Type 1 diabetes mellitus with diabetic neuropathy, unspecified: Secondary | ICD-10-CM | POA: Diagnosis not present

## 2014-06-17 DIAGNOSIS — N186 End stage renal disease: Secondary | ICD-10-CM | POA: Diagnosis not present

## 2014-06-17 DIAGNOSIS — IMO0002 Reserved for concepts with insufficient information to code with codable children: Secondary | ICD-10-CM | POA: Insufficient documentation

## 2014-06-17 DIAGNOSIS — I959 Hypotension, unspecified: Secondary | ICD-10-CM | POA: Diagnosis present

## 2014-06-17 DIAGNOSIS — Z89511 Acquired absence of right leg below knee: Secondary | ICD-10-CM

## 2014-06-17 DIAGNOSIS — Z992 Dependence on renal dialysis: Secondary | ICD-10-CM | POA: Diagnosis not present

## 2014-06-17 DIAGNOSIS — Z89512 Acquired absence of left leg below knee: Secondary | ICD-10-CM

## 2014-06-17 HISTORY — PX: AMPUTATION: SHX166

## 2014-06-17 LAB — POCT I-STAT 4, (NA,K, GLUC, HGB,HCT)
Glucose, Bld: 33 mg/dL — CL (ref 70–99)
HCT: 40 % (ref 39.0–52.0)
HEMOGLOBIN: 13.6 g/dL (ref 13.0–17.0)
Potassium: 4.3 mmol/L (ref 3.5–5.1)
Sodium: 135 mmol/L (ref 135–145)

## 2014-06-17 LAB — COMPREHENSIVE METABOLIC PANEL
ALT: 13 U/L (ref 0–53)
AST: 30 U/L (ref 0–37)
Albumin: 2.6 g/dL — ABNORMAL LOW (ref 3.5–5.2)
Alkaline Phosphatase: 77 U/L (ref 39–117)
Anion gap: 7 (ref 5–15)
BUN: 18 mg/dL (ref 6–23)
CO2: 28 mmol/L (ref 19–32)
Calcium: 9 mg/dL (ref 8.4–10.5)
Chloride: 97 mmol/L (ref 96–112)
Creatinine, Ser: 5.5 mg/dL — ABNORMAL HIGH (ref 0.50–1.35)
GFR calc Af Amer: 13 mL/min — ABNORMAL LOW (ref >90)
GFR, EST NON AFRICAN AMERICAN: 12 mL/min — AB (ref >90)
Glucose, Bld: 34 mg/dL — CL (ref 70–99)
Potassium: 4.3 mmol/L (ref 3.5–5.1)
Sodium: 132 mmol/L — ABNORMAL LOW (ref 135–145)
Total Bilirubin: 0.6 mg/dL (ref 0.3–1.2)
Total Protein: 8.3 g/dL (ref 6.0–8.3)

## 2014-06-17 LAB — CBC
HEMATOCRIT: 35.6 % — AB (ref 39.0–52.0)
Hemoglobin: 11.5 g/dL — ABNORMAL LOW (ref 13.0–17.0)
MCH: 30.4 pg (ref 26.0–34.0)
MCHC: 32.3 g/dL (ref 30.0–36.0)
MCV: 94.2 fL (ref 78.0–100.0)
PLATELETS: 210 10*3/uL (ref 150–400)
RBC: 3.78 MIL/uL — AB (ref 4.22–5.81)
RDW: 13.8 % (ref 11.5–15.5)
WBC: 9.3 10*3/uL (ref 4.0–10.5)

## 2014-06-17 LAB — GLUCOSE, CAPILLARY
GLUCOSE-CAPILLARY: 30 mg/dL — AB (ref 70–99)
GLUCOSE-CAPILLARY: 64 mg/dL — AB (ref 70–99)
GLUCOSE-CAPILLARY: 161 mg/dL — AB (ref 70–99)
Glucose-Capillary: 81 mg/dL (ref 70–99)
Glucose-Capillary: 93 mg/dL (ref 70–99)
Glucose-Capillary: 65 mg/dL — ABNORMAL LOW (ref 70–99)
Glucose-Capillary: 39 mg/dL — CL (ref 70–99)
Glucose-Capillary: 137 mg/dL — ABNORMAL HIGH (ref 70–99)
Glucose-Capillary: 154 mg/dL — ABNORMAL HIGH (ref 70–99)

## 2014-06-17 LAB — APTT: aPTT: 43 seconds — ABNORMAL HIGH (ref 24–37)

## 2014-06-17 LAB — PROTIME-INR
INR: 1.13 (ref 0.00–1.49)
Prothrombin Time: 14.6 seconds (ref 11.6–15.2)

## 2014-06-17 SURGERY — AMPUTATION BELOW KNEE
Anesthesia: General | Site: Leg Lower | Laterality: Left

## 2014-06-17 MED ORDER — BISACODYL 5 MG PO TBEC: 5.0000 mg | DELAYED_RELEASE_TABLET | Freq: Every day | ORAL | Status: DC | PRN

## 2014-06-17 MED ORDER — HYDROMORPHONE HCL 1 MG/ML IJ SOLN
0.2500 mg | INTRAMUSCULAR | Status: DC | PRN
  Administered 2014-06-17 (×4): 0.25 mg via INTRAVENOUS

## 2014-06-17 MED ORDER — EMTRICITABINE 200 MG PO CAPS
200.0000 mg | ORAL_CAPSULE | Freq: Every day | ORAL | Status: DC
  Administered 2014-06-17: 200 mg via ORAL
  Filled 2014-06-17 (×2): qty 1

## 2014-06-17 MED ORDER — LACTATED RINGERS IV SOLN: INTRAVENOUS | Status: DC | PRN

## 2014-06-17 MED ORDER — HYDROMORPHONE HCL 1 MG/ML IJ SOLN
INTRAMUSCULAR | Status: AC
  Filled 2014-06-17: qty 1

## 2014-06-17 MED ORDER — CEFAZOLIN SODIUM 1-5 GM-% IV SOLN
1.0000 g | Freq: Four times a day (QID) | INTRAVENOUS | Status: AC
  Administered 2014-06-17 – 2014-06-18 (×3): 1 g via INTRAVENOUS
  Filled 2014-06-17 (×3): qty 50

## 2014-06-17 MED ORDER — PHENYLEPHRINE 40 MCG/ML (10ML) SYRINGE FOR IV PUSH (FOR BLOOD PRESSURE SUPPORT)
PREFILLED_SYRINGE | INTRAVENOUS | Status: AC
  Filled 2014-06-17: qty 10

## 2014-06-17 MED ORDER — MIDODRINE HCL 5 MG PO TABS
10.0000 mg | ORAL_TABLET | Freq: Three times a day (TID) | ORAL | Status: DC
  Administered 2014-06-18 – 2014-06-20 (×8): 10 mg via ORAL
  Filled 2014-06-17 (×10): qty 2

## 2014-06-17 MED ORDER — DOCUSATE SODIUM 100 MG PO CAPS
100.0000 mg | ORAL_CAPSULE | Freq: Two times a day (BID) | ORAL | Status: DC
  Administered 2014-06-17 – 2014-06-20 (×5): 100 mg via ORAL
  Filled 2014-06-17 (×5): qty 1

## 2014-06-17 MED ORDER — ONDANSETRON HCL 4 MG/2ML IJ SOLN
INTRAMUSCULAR | Status: DC | PRN
  Administered 2014-06-17: 4 mg via INTRAVENOUS

## 2014-06-17 MED ORDER — ALTEPLASE 2 MG IJ SOLR
2.0000 mg | Freq: Once | INTRAMUSCULAR | Status: AC | PRN
  Filled 2014-06-17: qty 2

## 2014-06-17 MED ORDER — PHENYLEPHRINE HCL 10 MG/ML IJ SOLN
INTRAMUSCULAR | Status: DC | PRN
  Administered 2014-06-17 (×3): 80 ug via INTRAVENOUS

## 2014-06-17 MED ORDER — METOCLOPRAMIDE HCL 5 MG/ML IJ SOLN: 5.0000 mg | Freq: Three times a day (TID) | INTRAMUSCULAR | Status: DC | PRN

## 2014-06-17 MED ORDER — FENTANYL CITRATE 0.05 MG/ML IJ SOLN
INTRAMUSCULAR | Status: DC | PRN
  Administered 2014-06-17: 50 ug via INTRAVENOUS
  Administered 2014-06-17: 100 ug via INTRAVENOUS
  Administered 2014-06-17: 50 ug via INTRAVENOUS

## 2014-06-17 MED ORDER — LIDOCAINE HCL (CARDIAC) 20 MG/ML IV SOLN
INTRAVENOUS | Status: DC | PRN
  Administered 2014-06-17: 60 mg via INTRAVENOUS

## 2014-06-17 MED ORDER — SODIUM CHLORIDE 0.9 % IV SOLN: 100.0000 mL | INTRAVENOUS | Status: DC | PRN

## 2014-06-17 MED ORDER — OXYCODONE HCL 5 MG/5ML PO SOLN: 5.0000 mg | Freq: Once | ORAL | Status: DC | PRN

## 2014-06-17 MED ORDER — PENTAFLUOROPROP-TETRAFLUOROETH EX AERO
1.0000 | INHALATION_SPRAY | CUTANEOUS | Status: DC | PRN
Start: 2014-06-17 — End: 2014-06-18

## 2014-06-17 MED ORDER — DEXTROSE 50 % IV SOLN
INTRAVENOUS | Status: AC
  Filled 2014-06-17: qty 50

## 2014-06-17 MED ORDER — MAGNESIUM CITRATE PO SOLN
1.0000 | Freq: Once | ORAL | Status: AC | PRN
  Filled 2014-06-17: qty 296

## 2014-06-17 MED ORDER — NEPRO/CARBSTEADY PO LIQD
237.0000 mL | ORAL | Status: DC | PRN
  Filled 2014-06-17: qty 237

## 2014-06-17 MED ORDER — CHLORPROMAZINE HCL 25 MG PO TABS
25.0000 mg | ORAL_TABLET | Freq: Three times a day (TID) | ORAL | Status: DC | PRN
  Administered 2014-06-17: 25 mg via ORAL
  Filled 2014-06-17 (×2): qty 1

## 2014-06-17 MED ORDER — HYDROMORPHONE HCL 1 MG/ML IJ SOLN
0.5000 mg | INTRAMUSCULAR | Status: DC | PRN
  Administered 2014-06-17 – 2014-06-18 (×6): 1 mg via INTRAVENOUS
  Filled 2014-06-17 (×4): qty 1

## 2014-06-17 MED ORDER — SODIUM CHLORIDE 0.9 % IV SOLN
INTRAVENOUS | Status: DC
  Administered 2014-06-17: 13:00:00 via INTRAVENOUS

## 2014-06-17 MED ORDER — SENNOSIDES-DOCUSATE SODIUM 8.6-50 MG PO TABS: 1.0000 | ORAL_TABLET | Freq: Every evening | ORAL | Status: DC | PRN

## 2014-06-17 MED ORDER — ONDANSETRON HCL 4 MG/2ML IJ SOLN
4.0000 mg | Freq: Four times a day (QID) | INTRAMUSCULAR | Status: DC | PRN
  Administered 2014-06-18 – 2014-06-20 (×2): 4 mg via INTRAVENOUS
  Filled 2014-06-17 (×2): qty 2

## 2014-06-17 MED ORDER — SODIUM CHLORIDE 0.9 % IV SOLN
INTRAVENOUS | Status: DC
  Administered 2014-06-17: 19:00:00 via INTRAVENOUS
  Administered 2014-06-17: 10 mL/h via INTRAVENOUS

## 2014-06-17 MED ORDER — OXYCODONE HCL 5 MG PO TABS: 5.0000 mg | ORAL_TABLET | Freq: Once | ORAL | Status: DC | PRN

## 2014-06-17 MED ORDER — LIDOCAINE HCL (PF) 1 % IJ SOLN: 5.0000 mL | INTRAMUSCULAR | Status: DC | PRN

## 2014-06-17 MED ORDER — DEXTROSE 50 % IV SOLN
25.0000 g | Freq: Once | INTRAVENOUS | Status: AC
  Administered 2014-06-17: 25 mL via INTRAVENOUS
  Filled 2014-06-17: qty 50

## 2014-06-17 MED ORDER — ONDANSETRON HCL 4 MG PO TABS
4.0000 mg | ORAL_TABLET | Freq: Four times a day (QID) | ORAL | Status: DC | PRN
  Administered 2014-06-19: 4 mg via ORAL
  Filled 2014-06-17: qty 1

## 2014-06-17 MED ORDER — ONDANSETRON HCL 4 MG/2ML IJ SOLN: 4.0000 mg | Freq: Four times a day (QID) | INTRAMUSCULAR | Status: DC | PRN

## 2014-06-17 MED ORDER — METHOCARBAMOL 1000 MG/10ML IJ SOLN
500.0000 mg | Freq: Four times a day (QID) | INTRAVENOUS | Status: DC | PRN
  Filled 2014-06-17: qty 5

## 2014-06-17 MED ORDER — INSULIN ASPART 100 UNIT/ML ~~LOC~~ SOLN
0.0000 [IU] | Freq: Three times a day (TID) | SUBCUTANEOUS | Status: DC
  Administered 2014-06-18: 5 [IU] via SUBCUTANEOUS
  Administered 2014-06-19 (×3): 2 [IU] via SUBCUTANEOUS

## 2014-06-17 MED ORDER — ASPIRIN EC 325 MG PO TBEC
325.0000 mg | DELAYED_RELEASE_TABLET | Freq: Every day | ORAL | Status: DC
  Administered 2014-06-17 – 2014-06-20 (×4): 325 mg via ORAL
  Filled 2014-06-17 (×5): qty 1

## 2014-06-17 MED ORDER — DEXTROSE 50 % IV SOLN
INTRAVENOUS | Status: AC
  Administered 2014-06-17: 25 mL
  Filled 2014-06-17: qty 50

## 2014-06-17 MED ORDER — RITONAVIR 100 MG PO TABS
100.0000 mg | ORAL_TABLET | Freq: Every day | ORAL | Status: DC
  Administered 2014-06-18 – 2014-06-20 (×3): 100 mg via ORAL
  Filled 2014-06-17 (×4): qty 1

## 2014-06-17 MED ORDER — FENTANYL CITRATE 0.05 MG/ML IJ SOLN
INTRAMUSCULAR | Status: AC
  Filled 2014-06-17: qty 5

## 2014-06-17 MED ORDER — LIDOCAINE-PRILOCAINE 2.5-2.5 % EX CREA
1.0000 | TOPICAL_CREAM | CUTANEOUS | Status: DC | PRN
Start: 2014-06-17 — End: 2014-06-18
  Filled 2014-06-17: qty 5

## 2014-06-17 MED ORDER — METHOCARBAMOL 500 MG PO TABS
500.0000 mg | ORAL_TABLET | Freq: Four times a day (QID) | ORAL | Status: DC | PRN
  Administered 2014-06-18 – 2014-06-19 (×2): 500 mg via ORAL
  Filled 2014-06-17 (×2): qty 1

## 2014-06-17 MED ORDER — INSULIN ASPART 100 UNIT/ML ~~LOC~~ SOLN
3.0000 [IU] | Freq: Three times a day (TID) | SUBCUTANEOUS | Status: DC
  Administered 2014-06-18 – 2014-06-20 (×2): 3 [IU] via SUBCUTANEOUS

## 2014-06-17 MED ORDER — METOCLOPRAMIDE HCL 5 MG PO TABS
5.0000 mg | ORAL_TABLET | Freq: Three times a day (TID) | ORAL | Status: DC | PRN
  Administered 2014-06-20: 10 mg via ORAL
  Filled 2014-06-17: qty 2

## 2014-06-17 MED ORDER — OXYCODONE-ACETAMINOPHEN 5-325 MG PO TABS
1.0000 | ORAL_TABLET | ORAL | Status: DC | PRN
Start: 2014-06-17 — End: 2014-06-20
  Administered 2014-06-18 – 2014-06-19 (×5): 2 via ORAL
  Administered 2014-06-20: 1 via ORAL
  Filled 2014-06-17: qty 1
  Filled 2014-06-17 (×5): qty 2

## 2014-06-17 MED ORDER — CHLORPROMAZINE HCL 25 MG PO TABS
25.0000 mg | ORAL_TABLET | Freq: Three times a day (TID) | ORAL | Status: DC
  Filled 2014-06-17: qty 1

## 2014-06-17 MED ORDER — TENOFOVIR DISOPROXIL FUMARATE 300 MG PO TABS
300.0000 mg | ORAL_TABLET | Freq: Every day | ORAL | Status: DC
  Administered 2014-06-17: 300 mg via ORAL
  Filled 2014-06-17 (×2): qty 1

## 2014-06-17 MED ORDER — HEPARIN SODIUM (PORCINE) 1000 UNIT/ML DIALYSIS
1000.0000 [IU] | INTRAMUSCULAR | Status: DC | PRN
  Filled 2014-06-17: qty 1

## 2014-06-17 MED ORDER — DARUNAVIR ETHANOLATE 800 MG PO TABS
800.0000 mg | ORAL_TABLET | Freq: Every day | ORAL | Status: DC
  Administered 2014-06-18 – 2014-06-20 (×3): 800 mg via ORAL
  Filled 2014-06-17 (×5): qty 1

## 2014-06-17 MED ORDER — PROPOFOL 10 MG/ML IV BOLUS
INTRAVENOUS | Status: DC | PRN
  Administered 2014-06-17: 120 mg via INTRAVENOUS

## 2014-06-17 MED ORDER — TENOFOVIR DISOPROXIL FUMARATE 300 MG PO TABS
300.0000 mg | ORAL_TABLET | Freq: Every day | ORAL | Status: DC
  Filled 2014-06-17 (×2): qty 1

## 2014-06-17 SURGICAL SUPPLY — 37 items
BLADE SAW RECIP 87.9 MT (BLADE) ×2 IMPLANT
BLADE SURG 21 STRL SS (BLADE) ×2 IMPLANT
BNDG COHESIVE 4X5 TAN STRL (GAUZE/BANDAGES/DRESSINGS) ×2 IMPLANT
BNDG COHESIVE 6X5 TAN STRL LF (GAUZE/BANDAGES/DRESSINGS) ×4 IMPLANT
BNDG GAUZE ELAST 4 BULKY (GAUZE/BANDAGES/DRESSINGS) ×2 IMPLANT
COVER SURGICAL LIGHT HANDLE (MISCELLANEOUS) ×2 IMPLANT
CUFF TOURNIQUET SINGLE 34IN LL (TOURNIQUET CUFF) IMPLANT
CUFF TOURNIQUET SINGLE 44IN (TOURNIQUET CUFF) IMPLANT
DRAPE EXTREMITY T 121X128X90 (DRAPE) ×2 IMPLANT
DRAPE PROXIMA HALF (DRAPES) ×4 IMPLANT
DRAPE U-SHAPE 47X51 STRL (DRAPES) ×2 IMPLANT
DRSG ADAPTIC 3X8 NADH LF (GAUZE/BANDAGES/DRESSINGS) ×2 IMPLANT
DRSG PAD ABDOMINAL 8X10 ST (GAUZE/BANDAGES/DRESSINGS) ×2 IMPLANT
DURAPREP 26ML APPLICATOR (WOUND CARE) ×2 IMPLANT
ELECT REM PT RETURN 9FT ADLT (ELECTROSURGICAL) ×2
ELECTRODE REM PT RTRN 9FT ADLT (ELECTROSURGICAL) ×1 IMPLANT
GAUZE SPONGE 4X4 12PLY STRL (GAUZE/BANDAGES/DRESSINGS) ×2 IMPLANT
GLOVE BIOGEL PI IND STRL 9 (GLOVE) ×1 IMPLANT
GLOVE BIOGEL PI INDICATOR 9 (GLOVE) ×1
GLOVE SURG ORTHO 9.0 STRL STRW (GLOVE) ×2 IMPLANT
GOWN STRL REUS W/ TWL XL LVL3 (GOWN DISPOSABLE) ×2 IMPLANT
GOWN STRL REUS W/TWL XL LVL3 (GOWN DISPOSABLE) ×4
KIT BASIN OR (CUSTOM PROCEDURE TRAY) ×2 IMPLANT
KIT ROOM TURNOVER OR (KITS) ×2 IMPLANT
MANIFOLD NEPTUNE II (INSTRUMENTS) ×2 IMPLANT
NS IRRIG 1000ML POUR BTL (IV SOLUTION) ×2 IMPLANT
PACK GENERAL/GYN (CUSTOM PROCEDURE TRAY) ×2 IMPLANT
PAD ARMBOARD 7.5X6 YLW CONV (MISCELLANEOUS) ×4 IMPLANT
SPONGE LAP 18X18 X RAY DECT (DISPOSABLE) IMPLANT
STAPLER VISISTAT 35W (STAPLE) IMPLANT
STOCKINETTE IMPERVIOUS LG (DRAPES) ×2 IMPLANT
SUT SILK 2 0 (SUTURE) ×2
SUT SILK 2-0 18XBRD TIE 12 (SUTURE) ×1 IMPLANT
SUT VIC AB 1 CTX 27 (SUTURE) IMPLANT
TOWEL OR 17X24 6PK STRL BLUE (TOWEL DISPOSABLE) ×2 IMPLANT
TOWEL OR 17X26 10 PK STRL BLUE (TOWEL DISPOSABLE) ×2 IMPLANT
WATER STERILE IRR 1000ML POUR (IV SOLUTION) ×2 IMPLANT

## 2014-06-17 NOTE — Anesthesia Preprocedure Evaluation (Addendum)
Anesthesia Evaluation  Patient identified by MRN, date of birth, ID band Patient awake    Reviewed: Allergy & Precautions, NPO status , Patient's Chart, lab work & pertinent test results  Airway Mallampati: II   Neck ROM: full    Dental  (+) Teeth Intact, Dental Advidsory Given   Pulmonary          Cardiovascular negative cardio ROS      Neuro/Psych  Headaches,    GI/Hepatic (+) Hepatitis -, C  Endo/Other  diabetes, Type 2  Renal/GU ESRF and DialysisRenal disease     Musculoskeletal   Abdominal   Peds  Hematology  (+) HIV,   Anesthesia Other Findings   Reproductive/Obstetrics                            Anesthesia Physical Anesthesia Plan  ASA: III  Anesthesia Plan: General   Post-op Pain Management:    Induction: Intravenous  Airway Management Planned: LMA  Additional Equipment:   Intra-op Plan:   Post-operative Plan:   Informed Consent: I have reviewed the patients History and Physical, chart, labs and discussed the procedure including the risks, benefits and alternatives for the proposed anesthesia with the patient or authorized representative who has indicated his/her understanding and acceptance.   Dental Advisory Given  Plan Discussed with: CRNA, Anesthesiologist and Surgeon  Anesthesia Plan Comments:        Anesthesia Quick Evaluation

## 2014-06-17 NOTE — Anesthesia Procedure Notes (Signed)
Procedure Name: LMA Insertion Date/Time: 06/17/2014 2:38 PM Performed by: Neldon Newport Pre-anesthesia Checklist: Patient identified, Timeout performed, Emergency Drugs available, Suction available and Patient being monitored Patient Re-evaluated:Patient Re-evaluated prior to inductionOxygen Delivery Method: Circle system utilized Preoxygenation: Pre-oxygenation with 100% oxygen Intubation Type: IV induction Ventilation: Mask ventilation without difficulty LMA: LMA inserted LMA Size: 4.0 Number of attempts: 1 Tube secured with: Tape Dental Injury: Teeth and Oropharynx as per pre-operative assessment

## 2014-06-17 NOTE — H&P (Signed)
Alvin Daniels is an 44 y.o. male.   Chief Complaint: Ulceration osteomyelitis left foot salvage surgery HPI: Patient is a 44 year old gentleman with end-stage renal disease on dialysis with diabetes who has failed foot salvage intervention and presents at this time for transtibial amputation on the left.  Past Medical History  Diagnosis Date  . Hepatitis C   . MRSA infection   . Pancreatitis   . Chronic diarrhea   . HIV INFECTION 06/27/2010    Qualifier: Diagnosis of  By: Nickola Major CMA ( Seth Ward), Geni Bers    . Hypotension 06/02/2012  . Metabolic bone disease [id number]  . Normocytic anemia 06/17/2012  . Severe protein-calorie malnutrition 06/17/2012  . Diabetic neuropathy   . ESRD (end stage renal disease) on dialysis     "TTS; don't remember street name" (05/03/2014)  . Renal disorder   . Uncontrolled diabetes mellitus with complications 0000000    Annotation: uncontrolled Qualifier: Diagnosis of  By: Nickola Major CMA ( AAMA), Geni Bers      Past Surgical History  Procedure Laterality Date  . Av fistula placement Left   . Amputation Left 04/20/2014    Procedure: 3rd toe amputation, 4th Toe Amputation,  5th Toe Amputation;  Surgeon: Newt Minion, MD;  Location: [place];  Service: Orthopedics;  Laterality: Left;  . 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 Amputation Left 05/02/2014    Procedure: Midfoot Amputation;  Surgeon: Newt Minion, MD;  Location: [place];  Service: Orthopedics;  Laterality: Left;    Family History  Problem Relation Age of Onset  . Diabetes Mother   . Diabetes Father    Social History:  reports that he has never smoked. He has never used smokeless tobacco. He reports that he does not drink alcohol or use illicit drugs.  Allergies: No Known Allergies  No prescriptions prior to admission    No results found for this or any previous visit (from the past 48 hour(s)). No results found.  Review of Systems   All other systems  reviewed and are negative.   There were no vitals taken for this visit. Physical Exam  Dehiscence ulceration and osteomyelitis left midfoot.  Assessment/Plan  Assessment: Diabetic insensate neuropathy with peripheral vascular disease end stage renal disease failed foot salvage intervention.  Plan: We'll plan for left transtibial limitation. Risks and benefits were discussed including risk of the wound not healing. Patient states he understands and wishes to proceed at this time.   Alvin Daniels V 06/17/2014, 6:33 AM

## 2014-06-17 NOTE — Transfer of Care (Signed)
Immediate Anesthesia Transfer of Care Note  Patient: Alvin Daniels  Procedure(s) Performed: Procedure(s): AMPUTATION BELOW KNEE (Left)  Patient Location: PACU  Anesthesia Type:General  Level of Consciousness: awake, alert , oriented and patient cooperative  Airway & Oxygen Therapy: Patient Spontanous Breathing and Patient connected to nasal cannula oxygen  Post-op Assessment: Report given to RN, Post -op Vital signs reviewed and stable and Patient moving all extremities  Post vital signs: Reviewed and stable  Last Vitals:  Filed Vitals:   06/17/14 1203  BP: 100/73  Pulse: 91  Temp: 36.4 C  Resp: 18    Complications: No apparent anesthesia complications

## 2014-06-17 NOTE — Op Note (Signed)
   Date of Surgery: 06/17/2014  INDICATIONS: Alvin Daniels is a 44 y.o.-year-old male who has type 1 diabetes end stage renal disease on dialysis who is status post foot salvage surgery and presents at this time for left transtibial amputation.  PREOPERATIVE DIAGNOSIS: Osteomyelitis ulceration left hindfoot  POSTOPERATIVE DIAGNOSIS: Same.  PROCEDURE: Transtibial amputation  SURGEON: Sharol Given, M.D.  ANESTHESIA:  general  IV FLUIDS AND URINE: See anesthesia.  ESTIMATED BLOOD LOSS: Minimal mL.  COMPLICATIONS: None.  DESCRIPTION OF PROCEDURE: The patient was brought to the operating room and underwent a general anesthetic. After adequate levels of anesthesia were obtained patient's lower extremity was prepped using DuraPrep draped into a sterile field. A timeout was called.  A transverse incision was made 11 cm distal to the tibial tubercle. This curved proximally and a large posterior flap was created. The tibia was transected 1 cm proximal to the skin incision. The fibula was transected just proximal to the tibial incision. The tibia was beveled anteriorly. A large posterior flap was created. The sciatic nerve was pulled cut and allowed to retract. The vascular bundles were suture ligated with 2-0 silk. The deep and superficial fascial layers were closed using #1 Vicryl. The skin was closed using staples and 2-0 nylon. The wound was covered with Adaptic orthopedic sponges AB dressing Kerlix and Coban. Patient was extubated taken to the PACU in stable condition.  Alvin Score, MD [place] 5:59 PM

## 2014-06-17 NOTE — Progress Notes (Signed)
Patient complained of hiccups and requested for medication.  Called Dr. Eather Colas and Jeneen Rinks of pharmacy to verify the particular meds for hiccups and new order issued by MD with telephone readback.

## 2014-06-17 NOTE — Progress Notes (Signed)
Jarrett Soho, Social Worker called me this am wanting further explanation of what was needed for this patient. I explained to her that he needs to be here at 12:15 PM today for his surgery and he has no transportation. She states she will get on this right away and will arrange either [facility] or a taxi to bring him here.

## 2014-06-18 LAB — RENAL FUNCTION PANEL
Albumin: 2.1 g/dL — ABNORMAL LOW (ref 3.5–5.2)
Anion gap: 8 (ref 5–15)
BUN: 23 mg/dL (ref 6–23)
CALCIUM: 8.1 mg/dL — AB (ref 8.4–10.5)
CO2: 26 mmol/L (ref 19–32)
CREATININE: 6.75 mg/dL — AB (ref 0.50–1.35)
Chloride: 96 mmol/L (ref 96–112)
GFR calc Af Amer: 10 mL/min — ABNORMAL LOW (ref >90)
GFR, EST NON AFRICAN AMERICAN: 9 mL/min — AB (ref >90)
GLUCOSE: 105 mg/dL — AB (ref 70–99)
PHOSPHORUS: 4.2 mg/dL (ref 2.3–4.6)
Potassium: 4.5 mmol/L (ref 3.5–5.1)
SODIUM: 130 mmol/L — AB (ref 135–145)

## 2014-06-18 LAB — CBC
HEMATOCRIT: 30.8 % — AB (ref 39.0–52.0)
Hemoglobin: 10 g/dL — ABNORMAL LOW (ref 13.0–17.0)
MCH: 30.9 pg (ref 26.0–34.0)
MCHC: 32.5 g/dL (ref 30.0–36.0)
MCV: 95.1 fL (ref 78.0–100.0)
PLATELETS: 185 10*3/uL (ref 150–400)
RBC: 3.24 MIL/uL — ABNORMAL LOW (ref 4.22–5.81)
RDW: 13.9 % (ref 11.5–15.5)
WBC: 4.9 10*3/uL (ref 4.0–10.5)

## 2014-06-18 LAB — GLUCOSE, CAPILLARY
GLUCOSE-CAPILLARY: 40 mg/dL — AB (ref 70–99)
Glucose-Capillary: 262 mg/dL — ABNORMAL HIGH (ref 70–99)
Glucose-Capillary: 33 mg/dL — CL (ref 70–99)
Glucose-Capillary: 51 mg/dL — ABNORMAL LOW (ref 70–99)
Glucose-Capillary: 72 mg/dL (ref 70–99)
Glucose-Capillary: 93 mg/dL (ref 70–99)

## 2014-06-18 LAB — MRSA PCR SCREENING: MRSA BY PCR: NEGATIVE

## 2014-06-18 MED ORDER — HYDROMORPHONE HCL 1 MG/ML IJ SOLN
INTRAMUSCULAR | Status: AC
  Filled 2014-06-18: qty 1

## 2014-06-18 MED ORDER — DEXTROSE 50 % IV SOLN
INTRAVENOUS | Status: AC
  Administered 2014-06-19: 50 mL
  Filled 2014-06-18: qty 50

## 2014-06-18 MED ORDER — HYDROMORPHONE HCL 1 MG/ML IJ SOLN
INTRAMUSCULAR | Status: AC
Start: 2014-06-18 — End: 2014-06-18
  Filled 2014-06-18: qty 1

## 2014-06-18 MED ORDER — DARBEPOETIN ALFA 25 MCG/0.42ML IJ SOSY
25.0000 ug | PREFILLED_SYRINGE | INTRAMUSCULAR | Status: DC
Start: 2014-06-21 — End: 2014-06-20

## 2014-06-18 MED ORDER — MIDODRINE HCL 5 MG PO TABS
ORAL_TABLET | ORAL | Status: AC
  Filled 2014-06-18: qty 2

## 2014-06-18 MED ORDER — TENOFOVIR DISOPROXIL FUMARATE 300 MG PO TABS: 300.0000 mg | ORAL_TABLET | ORAL | Status: DC

## 2014-06-18 MED ORDER — RENA-VITE PO TABS
1.0000 | ORAL_TABLET | Freq: Every day | ORAL | Status: DC
  Administered 2014-06-18 – 2014-06-19 (×2): 1 via ORAL
  Filled 2014-06-18 (×4): qty 1

## 2014-06-18 MED ORDER — DEXTROSE 50 % IV SOLN
INTRAVENOUS | Status: AC
Start: 2014-06-18 — End: 2014-06-18
  Administered 2014-06-18: 25 mL
  Filled 2014-06-18: qty 50

## 2014-06-18 MED ORDER — SODIUM CHLORIDE 0.9 % IV SOLN: 62.5000 mg | INTRAVENOUS | Status: DC

## 2014-06-18 MED ORDER — EMTRICITABINE 200 MG PO CAPS
200.0000 mg | ORAL_CAPSULE | ORAL | Status: DC
  Administered 2014-06-18: 200 mg via ORAL
  Filled 2014-06-18 (×2): qty 1

## 2014-06-18 MED ORDER — DOXERCALCIFEROL 4 MCG/2ML IV SOLN
1.0000 ug | INTRAVENOUS | Status: DC
Start: 2014-06-23 — End: 2014-06-20

## 2014-06-18 NOTE — Evaluation (Signed)
Physical Therapy Evaluation Patient Details Name: Alvin Daniels MRN: [id number] DOB: 1970-07-27 Today's Date: 06/18/2014   History of Present Illness  Pt is a 44 y.o. male s/p Lt BKA.   Clinical Impression  Pt admitted with above diagnosis. Pt currently with functional limitations due to the deficits listed below (see PT Problem List). Pt demonstrated a fairly safe transfer to recliner from bed with RW.  Feel that a brief NH stay would benefit pt to learn safety with transfer, ADLs and mobility.  Pt adamant he can go home with brother.  Will continue PT.   Pt will benefit from skilled PT to increase their independence and safety with mobility to allow discharge to the venue listed below.      Follow Up Recommendations SNF;Supervision/Assistance - 24 hour    Equipment Recommendations  Other (comment) (TBA)    Recommendations for Other Services       Precautions / Restrictions Precautions Precautions: Fall Restrictions Weight Bearing Restrictions: Yes LLE Weight Bearing: Non weight bearing      Mobility  Bed Mobility Overal bed mobility: Independent                Transfers Overall transfer level: Needs assistance Equipment used: Rolling walker (2 wheeled) Transfers: Sit to/from [facility] Sit to Stand: Min guard;Min assist Stand pivot transfers: Min guard;Min assist       General transfer comment: Pt fairly safe with sit to stand and with pivot transfer to recliner.    Ambulation/Gait             General Gait Details: Refused to ambulate due to pain.  Stairs            Wheelchair Mobility    Modified Rankin (Stroke Patients Only)       Balance Overall balance assessment: Needs assistance         Standing balance support: Bilateral upper extremity supported;During functional activity Standing balance-Leahy Scale: Poor Standing balance comment: requires RW for balance.                               Pertinent Vitals/Pain Pain Assessment: 0-10 Pain Score: 8  Pain Location: left LE Pain Descriptors / Indicators: Aching Pain Intervention(s): Premedicated before session;Repositioned;Monitored during session;Limited activity within patient's tolerance  VSS    Home Living Family/patient expects to be discharged to:: Private residence Living Arrangements: Other relatives;Other (Comment) (Brother) Available Help at Discharge: Family;Available 24 hours/day Type of Home: Apartment Home Access: Level entry     Home Layout: One level Home Equipment: Walker - 2 wheels;Wheelchair - manual Additional Comments: Friends in room interpreted.    Prior Function Level of Independence: Independent               Hand Dominance   Dominant Hand: Right    Extremity/Trunk Assessment   Upper Extremity Assessment: Defer to OT evaluation           Lower Extremity Assessment: Generalized weakness         Communication   Communication: Prefers language other than English  Cognition Arousal/Alertness: Awake/alert Behavior During Therapy: WFL for tasks assessed/performed Overall Cognitive Status: Within Functional Limits for tasks assessed                      General Comments      Exercises General Exercises - Lower Extremity Quad Sets: AROM;Left;10 reps;Supine Heel Slides: AROM;Left;10 reps;Supine  Assessment/Plan    PT Assessment Patient needs continued PT services  PT Diagnosis Generalized weakness;Acute pain   PT Problem List Decreased mobility;Decreased knowledge of use of DME;Decreased safety awareness;Decreased knowledge of precautions;Decreased activity tolerance;Decreased balance;Pain  PT Treatment Interventions DME instruction;Gait training;Functional mobility training;Therapeutic activities;Therapeutic exercise;Balance training;Patient/family education   PT Goals (Current goals can be found in the Care Plan section) Acute Rehab PT  Goals Patient Stated Goal: to go home PT Goal Formulation: With patient Time For Goal Achievement: 06/25/14 Potential to Achieve Goals: Good    Frequency Min 5X/week   Barriers to discharge Decreased caregiver support      Co-evaluation               End of Session Equipment Utilized During Treatment: Gait belt Activity Tolerance: Patient limited by fatigue Patient left: in chair;with call bell/phone within reach;with family/visitor present Nurse Communication: Mobility status         Time: [id number] PT Time Calculation (min) (ACUTE ONLY): 12 min   Charges:   PT Evaluation $Initial PT Evaluation Tier I: 1 Procedure     PT G CodesDenice Daniels Jun 23, 2014, 4:13 PM [place] [facility] Acute Rehabilitation [phone] (pager)

## 2014-06-18 NOTE — Progress Notes (Signed)
Hypoglycemic Event  CBG: 41  Treatment: 15 GM carbohydrate snack  Symptoms: None  Follow-up CBG: Time 2153 CBG Result:33  Possible Reasons for Event: Unknown  Comments/MD notified:no    Alvin Daniels, Alvin Daniels  Remember to initiate Hypoglycemia Order Set & complete

## 2014-06-18 NOTE — Progress Notes (Signed)
Patient ID: Alvin Daniels, male   DOB: Aug 09, 1970, 44 y.o.   MRN: [id number] Postoperative day 1 left transtibial amputation. Plan for dialysis during his hospitalization. Patient may require discharge to skilled nursing.

## 2014-06-18 NOTE — Consult Note (Signed)
Indication for Consultation:  Management of ESRD/hemodialysis; anemia, hypertension/volume and secondary hyperparathyroidism  HPI: Alvin Daniels is a 44 y.o. male who was admitted yesterday for L BKA after failing salvage intervention. He receives [facility], hx DM, HIV, Hep C and hypotension. He had ulceration osteomyelitis of his L foot and had failed salvage interventions, he had a transmet in December and had been on antiboptics, he now decided to proceed with L BKA 2/5 with Dr Sharol Given. He otherwise says he has been feeling well and tolerating HD, will keep on TTS schedule while in the hospital.   Past Medical History  Diagnosis Date  . Hepatitis C   . MRSA infection   . Pancreatitis   . Chronic diarrhea   . HIV INFECTION 06/27/2010    Qualifier: Diagnosis of  By: Nickola Major CMA ( [place]), Geni Bers    . Hypotension 06/02/2012  . Metabolic bone disease [id number]  . Normocytic anemia 06/17/2012  . Severe protein-calorie malnutrition 06/17/2012  . Diabetic neuropathy   . ESRD (end stage renal disease) on dialysis     "TTS; don't remember street name" (05/03/2014)  . Renal disorder   . Uncontrolled diabetes mellitus with complications 0000000    Annotation: uncontrolled Qualifier: Diagnosis of  By: Nickola Major CMA ( AAMA), Geni Bers     Past Surgical History  Procedure Laterality Date  . Av fistula placement Left   . Amputation Left 04/20/2014    Procedure: 3rd toe amputation, 4th Toe Amputation,  5th Toe Amputation;  Surgeon: Newt Minion, MD;  Location: [place];  Service: Orthopedics;  Laterality: Left;  . 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 Amputation Left 05/02/2014    Procedure: Midfoot Amputation;  Surgeon: Newt Minion, MD;  Location: [place];  Service: Orthopedics;  Laterality: Left;   Family History  Problem Relation Age of Onset  . Diabetes Mother   . Diabetes Father    Social History:  reports that he has never smoked. He has never used  smokeless tobacco. He reports that he does not drink alcohol or use illicit drugs. No Known Allergies Prior to Admission medications   Medication Sig Start Date End Date Taking? Authorizing Provider  insulin lispro protamine-lispro (HUMALOG MIX 75/25) (75-25) 100 UNIT/ML SUSP injection Inject 15 Units into the skin 2 (two) times daily with a meal. 15 units in the am and 15 units in the pm 10/25/13  Yes Deepak Advani, MD  levofloxacin (LEVAQUIN) 500 MG tablet Take 1 tablet (500 mg total) by mouth every other day. 04/25/14  Yes Leone Haven, MD  methocarbamol (ROBAXIN) 500 MG tablet Take 1 tablet (500 mg total) by mouth every 6 (six) hours as needed for muscle spasms. 04/22/14  Yes Dimas Chyle, MD  midodrine (PROAMATINE) 10 MG tablet Take 1 tablet (10 mg total) by mouth 3 (three) times daily with meals. 04/22/14  Yes Dimas Chyle, MD  oxyCODONE-acetaminophen (PERCOCET/ROXICET) 5-325 MG per tablet Take 1 tablet by mouth every 4 (four) hours as needed for moderate pain or severe pain. 04/24/14  Yes Leone Haven, MD  oxyCODONE-acetaminophen (ROXICET) 5-325 MG per tablet Take 1 tablet by mouth every 4 (four) hours as needed for severe pain. 05/03/14  Yes Newt Minion, MD  sulfamethoxazole-trimethoprim (BACTRIM,SEPTRA) 400-80 MG per tablet Take 1 tablet by mouth daily. 04/24/14  Yes Alveda Reasons, MD  tenofovir Veva Holes) 300 MG tablet One tablet by mouth weekly every Sunday. 01/27/14  Yes Carlyle Basques, MD  docusate sodium 100 MG CAPS Take 100 mg by mouth 2 (two) times daily. Patient not taking: Reported on 05/02/2014 04/22/14   Dimas Chyle, MD  doxycycline (VIBRAMYCIN) 100 MG capsule Take 1 capsule (100 mg total) by mouth 2 (two) times daily. One po bid x 7 days 06/10/14   Wandra Arthurs, MD  EMTRIVA 200 MG capsule TAKE 1 CAPSULE BY MOUTH EVERY TUESDAY, AND SATURDAY FOLLOWING DIALYSIS 05/11/14   Campbell Riches, MD  loperamide (IMODIUM A-D) 2 MG tablet Take 1 tablet (2 mg total) by mouth 4  (four) times daily as needed for diarrhea or loose stools. Patient not taking: Reported on 05/02/2014 02/23/14   Campbell Riches, MD  NORVIR 100 MG TABS tablet TAKE 1 TABLET BY MOUTH EVERY DAY WITH BREAKFAST**MUST TAKE WITH PREZISTA 05/11/14   Campbell Riches, MD  ondansetron (ZOFRAN) 4 MG tablet Take 1 tablet (4 mg total) by mouth every 6 (six) hours as needed for nausea. Patient not taking: Reported on 05/02/2014 04/22/14   Dimas Chyle, MD  polyethylene glycol [facility] / Floria Raveling) packet Take 17 g by mouth daily as needed for moderate constipation or severe constipation. Patient not taking: Reported on 05/02/2014 04/22/14   Dimas Chyle, MD  PREZISTA 800 MG tablet TAKE 1 TABLET BY MOUTH EVERY DAY WITH BREAKFAST 05/11/14   Campbell Riches, MD  VIREAD 300 MG tablet TAKE 1 TABLET BY MOUTH EVERY WEEK ON SUNDAY 06/07/14   Campbell Riches, MD   Current Facility-Administered Medications  Medication Dose Route Frequency Provider Last Rate Last Dose  . 0.9 %  sodium chloride infusion   Intravenous Continuous Newt Minion, MD 10 mL/hr at 06/17/14 1850    . 0.9 %  sodium chloride infusion  100 mL Intravenous PRN Marlena Clipper, NP      . 0.9 %  sodium chloride infusion  100 mL Intravenous PRN Marlena Clipper, NP      . aspirin EC tablet 325 mg  325 mg Oral Daily Newt Minion, MD   325 mg at 06/17/14 2022  . bisacodyl (DULCOLAX) EC tablet 5 mg  5 mg Oral Daily PRN Newt Minion, MD      . chlorproMAZINE (THORAZINE) tablet 25 mg  25 mg Oral TID PRN Newt Minion, MD   25 mg at 06/17/14 2335  . Darunavir Ethanolate (PREZISTA) tablet 800 mg  800 mg Oral Q breakfast Meridee Score V, MD      . docusate sodium (COLACE) capsule 100 mg  100 mg Oral BID Newt Minion, MD   100 mg at 06/17/14 2227  . emtricitabine (EMTRIVA) capsule 200 mg  200 mg Oral Once per day on Tue Sat Newt Minion, MD      . feeding supplement (NEPRO CARB STEADY) liquid 237 mL  237 mL Oral PRN Marlena Clipper, NP       . heparin injection 1,000 Units  1,000 Units Dialysis PRN Marlena Clipper, NP      . HYDROmorphone (DILAUDID) 1 MG/ML injection           . HYDROmorphone (DILAUDID) injection 0.5-1 mg  0.5-1 mg Intravenous Q2H PRN Newt Minion, MD   1 mg at 06/18/14 0755  . insulin aspart (novoLOG) injection 0-9 Units  0-9 Units Subcutaneous TID WC Meridee Score V, MD      . insulin aspart (novoLOG) injection 3 Units  3 Units Subcutaneous TID WC Newt Minion, MD      .  lidocaine (PF) (XYLOCAINE) 1 % injection 5 mL  5 mL Intradermal PRN Marlena Clipper, NP      . lidocaine-prilocaine (EMLA) cream 1 application  1 application Topical PRN Marlena Clipper, NP      . methocarbamol (ROBAXIN) tablet 500 mg  500 mg Oral Q6H PRN Newt Minion, MD       Or  . methocarbamol (ROBAXIN) 500 mg in dextrose 5 % 50 mL IVPB  500 mg Intravenous Q6H PRN Newt Minion, MD      . metoCLOPramide (REGLAN) tablet 5-10 mg  5-10 mg Oral Q8H PRN Newt Minion, MD       Or  . metoCLOPramide (REGLAN) injection 5-10 mg  5-10 mg Intravenous Q8H PRN Newt Minion, MD      . midodrine (PROAMATINE) tablet 10 mg  10 mg Oral TID WC Newt Minion, MD   10 mg at 06/18/14 0746  . ondansetron (ZOFRAN) tablet 4 mg  4 mg Oral Q6H PRN Newt Minion, MD       Or  . ondansetron [facility] injection 4 mg  4 mg Intravenous Q6H PRN Newt Minion, MD      . oxyCODONE-acetaminophen (PERCOCET/ROXICET) 5-325 MG per tablet 1-2 tablet  1-2 tablet Oral Q4H PRN Newt Minion, MD      . pentafluoroprop-tetrafluoroeth Landry Dyke) aerosol 1 application  1 application Topical PRN Marlena Clipper, NP      . ritonavir (NORVIR) tablet 100 mg  100 mg Oral Q breakfast Newt Minion, MD      . senna-docusate (Senokot-S) tablet 1 tablet  1 tablet Oral QHS PRN Newt Minion, MD      . Derrill Memo ON 06/24/2014] tenofovir (VIREAD) tablet 300 mg  300 mg Oral Weekly Newt Minion, MD       Labs: Basic Metabolic Panel:  Recent Labs Lab 06/13/14 1406 06/17/14 1218  06/17/14 1225 06/18/14 0749  NA 130* 132* 135 130*  K 4.9 4.3 4.3 4.5  CL 97 97  --  96  CO2 23 28  --  26  GLUCOSE 227* 34* 33* 105*  BUN 26* 18  --  23  CREATININE 6.78* 5.50*  --  6.75*  CALCIUM 8.4 9.0  --  8.1*  PHOS  --   --   --  4.2   Liver Function Tests:  Recent Labs Lab 06/13/14 1406 06/17/14 1218 06/18/14 0749  AST 21 30  --   ALT 9 13  --   ALKPHOS 87 77  --   BILITOT 0.4 0.6  --   PROT 7.0 8.3  --   ALBUMIN 2.7* 2.6* 2.1*   No results for input(s): LIPASE, AMYLASE in the last 168 hours. No results for input(s): AMMONIA in the last 168 hours. CBC:  Recent Labs Lab 06/13/14 1406 06/17/14 1218 06/17/14 1225 06/18/14 0749  WBC 5.7 9.3  --  4.9  HGB 11.0* 11.5* 13.6 10.0*  HCT 34.5* 35.6* 40.0 30.8*  MCV 95.6 94.2  --  95.1  PLT 209 210  --  185   Cardiac Enzymes: No results for input(s): CKTOTAL, CKMB, CKMBINDEX, TROPONINI in the last 168 hours. CBG:  Recent Labs Lab 06/17/14 1528 06/17/14 1652 06/17/14 1736 06/17/14 1849 06/17/14 2114  GLUCAP 65* 39* 161* 137* 154*   Iron Studies: No results for input(s): IRON, TIBC, TRANSFERRIN, FERRITIN in the last 72 hours. Studies/Results: No results found.  ROS:  Review of Systems: Unable to fully  obtain d/t language barrier. Says feels good, no complaints except for pain in L leg.   Physical Exam: Filed Vitals:   06/18/14 0900 06/18/14 0930 06/18/14 1000 06/18/14 1030  BP: 80/44 87/55 102/69 84/53  Pulse: 73 75 64 75  Temp:      TempSrc:      Resp:      Height:      Weight:      SpO2:         General: Well developed, thin, in no acute distress. Resting in HD Head: Normocephalic, atraumatic, sclera non-icteric, mucus membranes are moist Neck: Supple. JVD not elevated. Lungs: Clear bilaterally to auscultation without wheezes, rales, or rhonchi. Breathing is unlabored. Heart: RRR with S1 S2. No murmurs, rubs, or gallops appreciated. Abdomen: Soft, non-tender, non-distended with  normoactive bowel sounds. No rebound/guarding. No obvious abdominal masses. M-S:  Strength and tone appear normal for age. Lower extremities: L BKA post op dressing intact. R no edema Neuro: Alert and oriented X 3. Moves all extremities spontaneously. Psych:  Responds to questions appropriately with a normal affect. Some language barrier Dialysis Access:  R AVF patent on HD  Dialysis Orders:  TTS [facility] 4 hr    49kgs     2k/2.25Ca     400/1.5   160  5000u heparin Aranesp 25 q 2 weeks (last 1/26)  hectorol 1   Venofer 10 q week  Assessment/Plan: 1.  osteomyelitis L foot- failed salvage interventions s/p L BKA 2/5 Dr Sharol Given on ancef 2.  ESRD - TTS [facility], HD today, K+4.5 3.  HyPOtension/volume  - on midodrine, under edw, will need lowered at DC/ BP limiting UF. SBP does drop into the 80s at outpt center/asymptomatic 4.  Anemia  - hgb 10 on Aranesp q 2 weeks, will be due. 2/9 watch CBC postop- cont weekly Fe, just finished Fe bolus for tsat 22 5.  Metabolic bone disease -  Ca+ 8.1/phos 4.2 cont hectorol, no binder 6.  Nutrition - alb 2.1. Renal diet. nepro 7. DM -per primary  Shelle Iron, NP [facility] [phone] 06/18/2014, 11:06 AM   Renal Attending: Pt with multiple comorbidity with osteo requiring amputation.  Will support from renal perpective. Carely Nappier C

## 2014-06-18 NOTE — Progress Notes (Signed)
PT Cancellation Note  Patient Details Name: Alvin Daniels MRN: [id number] DOB: May 12, 1971   Cancelled Treatment:    Reason Eval/Treat Not Completed: Patient at procedure or test/unavailable (Pt in HD all am.  Will return as able.  Thanks. )   Dema Severin, Devonte Migues F 06/18/2014, 11:59 AM  [facility] [phone] (pager)

## 2014-06-19 LAB — GLUCOSE, CAPILLARY
GLUCOSE-CAPILLARY: 198 mg/dL — AB (ref 70–99)
GLUCOSE-CAPILLARY: 153 mg/dL — AB (ref 70–99)
GLUCOSE-CAPILLARY: 153 mg/dL — AB (ref 70–99)
GLUCOSE-CAPILLARY: 165 mg/dL — AB (ref 70–99)
Glucose-Capillary: 151 mg/dL — ABNORMAL HIGH (ref 70–99)
Glucose-Capillary: 159 mg/dL — ABNORMAL HIGH (ref 70–99)
Glucose-Capillary: 183 mg/dL — ABNORMAL HIGH (ref 70–99)
Glucose-Capillary: 90 mg/dL (ref 70–99)

## 2014-06-19 NOTE — Progress Notes (Signed)
Physical Therapy Treatment Patient Details Name: Alvin Daniels MRN: [id number] DOB: [birth year] Today's Date: 06/19/2014    History of Present Illness Pt is a 44 y.o. male s/p Lt BKA.     PT Comments    Patient initially refusing to work with therapy but was able to get interpreter on phone and explain to patient the importance of working with therapy to determine his home needs. Patient is planning to DC home tomorrow with assistance from his brother. His brother works 3 days a week and will be with him at all other times. Patient take dialysis T,TH and Sat and gets on a bus at 5:30 AM. At this time recommend HHPT and use of RW in the house. Patient may need WC for transporting to dialysis and back. Will follow up in AM  Follow Up Recommendations  Home health PT;Supervision - Intermittent     Equipment Recommendations  Rolling walker with 5" wheels;Wheelchair (measurements PT);3in1 (PT)    Recommendations for Other Services       Precautions / Restrictions Precautions Precautions: Fall Restrictions LLE Weight Bearing: Non weight bearing    Mobility  Bed Mobility                  Transfers Overall transfer level: Needs assistance Equipment used: Rolling walker (2 wheeled)   Sit to Stand: Supervision         General transfer comment: Pt fairly safe with sit to stand. Supervision for safety and cues for hand placement  Ambulation/Gait Ambulation/Gait assistance: Min guard Ambulation Distance (Feet): 30 Feet Assistive device: Rolling walker (2 wheeled) Gait Pattern/deviations: Step-to pattern     General Gait Details: Patient agreeable to ambulate to door and back. Good balance and use of RW   Stairs            Wheelchair Mobility    Modified Rankin (Stroke Patients Only)       Balance                                    Cognition Arousal/Alertness: Awake/alert Behavior During Therapy: WFL for tasks  assessed/performed Overall Cognitive Status: Within Functional Limits for tasks assessed                      Exercises      General Comments        Pertinent Vitals/Pain Pain Assessment: No/denies pain    Home Living                      Prior Function            PT Goals (current goals can now be found in the care plan section) Progress towards PT goals: Progressing toward goals    Frequency  Min 5X/week    PT Plan Discharge plan needs to be updated    Co-evaluation             End of Session Equipment Utilized During Treatment: Gait belt Activity Tolerance: Patient tolerated treatment well Patient left: in chair;with call bell/phone within reach     Time: 0925-0949 PT Time Calculation (min) (ACUTE ONLY): 24 min  Charges:  $Gait Training: 8-22 mins $Therapeutic Activity: 8-22 mins                    G Codes:      Jacqualyn Posey 06/19/2014, 12:01  PM .06/19/2014 Jacqualyn Posey PTA [phone] pager [phone] office

## 2014-06-19 NOTE — Progress Notes (Signed)
Subjective:   No complaints, sitting up in chair on phone  Objective Filed Vitals:   06/18/14 1138 06/18/14 2126 06/19/14 0134 06/19/14 0514  BP: 87/58  125/84 135/86  Pulse: 84 84 82 81  Temp: 98 F (36.7 C) 98.3 F (36.8 C) 98 F (36.7 C) 98.3 F (36.8 C)  TempSrc: Oral Oral Oral Oral  Resp: 18 18 17 16   Height:      Weight: 48.5 kg (106 lb 14.8 oz)     SpO2: 100% 100% 97% 100%   Physical Exam General: alert and oriented, no acute distress.  Heart: RRR  Lungs: CTA, unlabored.  Abdomen: soft, nontender +BS Extremities: L BKA dressing intact  Dialysis Access: R AVF +b/t  Dialysis Orders: TTS [facility] 4 hr 49kgs 2k/2.25Ca 400/1.5 160 5000u heparin Aranesp 25 q 2 weeks (last 1/26) hectorol 1 Venofer 10 q week  Assessment/Plan: 1. 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 ESRD - TTS NW, HD next on tuesday 3. HyPOtension/volume - on midodrine, under edw, will need lowered at DC/ BP limiting UF. SBP does drop into the 80s at outpt center/asymptomatic 4. Anemia - hgb 10 on Aranesp q 2 weeks, will be due. 2/9 watch CBC postop- cont weekly Fe, just finished Fe bolus for tsat 22 5. Metabolic bone disease - Ca+ 8.1/phos 4.2 cont hectorol, no binder 6. Nutrition - alb 2.1. Renal diet. nepro 7. DM -per primary/hypoglycemia last night 8. dispo- home vs SNF  Shelle Iron, NP [place] [phone] 06/19/2014,11:22 AM  LOS: 2 days   Renal Attending: Agree with above. Severe post op hypogycemia yesterday.  Agree with note as outlined above. Will need adjustments in insulin per primary and we will plan for next HD on Tues. Henrick Mcgue C   Additional Objective Labs: Basic Metabolic Panel:  Recent Labs Lab 06/13/14 1406 06/17/14 1218 06/17/14 1225 06/18/14 0749  NA 130* 132* 135 130*  K 4.9 4.3 4.3 4.5  CL 97 97  --  96  CO2 23 28  --  26  GLUCOSE 227* 34* 33* 105*  BUN 26* 18  --  23  CREATININE  6.78* 5.50*  --  6.75*  CALCIUM 8.4 9.0  --  8.1*  PHOS  --   --   --  4.2   Liver Function Tests:  Recent Labs Lab 06/13/14 1406 06/17/14 1218 06/18/14 0749  AST 21 30  --   ALT 9 13  --   ALKPHOS 87 77  --   BILITOT 0.4 0.6  --   PROT 7.0 8.3  --   ALBUMIN 2.7* 2.6* 2.1*   No results for input(s): LIPASE, AMYLASE in the last 168 hours. CBC:  Recent Labs Lab 06/13/14 1406 06/17/14 1218 06/17/14 1225 06/18/14 0749  WBC 5.7 9.3  --  4.9  HGB 11.0* 11.5* 13.6 10.0*  HCT 34.5* 35.6* 40.0 30.8*  MCV 95.6 94.2  --  95.1  PLT 209 210  --  185   Blood Culture    Component Value Date/Time   SDES BLOOD RIGHT ANTECUBITAL 04/18/2014 1305   SPECREQUEST BOTTLES DRAWN AEROBIC AND ANAEROBIC 5CC 04/18/2014 1305   CULT  04/18/2014 1305    NO GROWTH 5 DAYS Performed at [facility] 04/24/2014 FINAL 04/18/2014 1305    Cardiac Enzymes: No results for input(s): CKTOTAL, CKMB, CKMBINDEX, TROPONINI in the last 168 hours. CBG:  Recent Labs Lab 06/19/14 0032 06/19/14 0131 06/19/14 0231  06/19/14 0512 06/19/14 0727  GLUCAP 159* 151* 183* 198* 153*   Iron Studies: No results for input(s): IRON, TIBC, TRANSFERRIN, FERRITIN in the last 72 hours. @lablastinr3 @ Studies/Results: No results found. Medications: . sodium chloride 10 mL/hr at 06/17/14 1850   . aspirin EC  325 mg Oral Daily  . [START ON 06/21/2014] darbepoetin (ARANESP) injection - DIALYSIS  25 mcg Intravenous Q Tue-HD  . Darunavir Ethanolate  800 mg Oral Q breakfast  . docusate sodium  100 mg Oral BID  . [START ON 06/23/2014] doxercalciferol  1 mcg Intravenous Weekly  . emtricitabine  200 mg Oral Once per day on Tue Sat  . [START ON 06/21/2014] ferric gluconate (FERRLECIT/NULECIT) IV  62.5 mg Intravenous Weekly  . insulin aspart  0-9 Units Subcutaneous TID WC  . insulin aspart  3 Units Subcutaneous TID WC  . midodrine  10 mg Oral TID WC  . multivitamin  1 tablet Oral QHS  . ritonavir  100 mg  Oral Q breakfast  . [START ON 06/24/2014] tenofovir  300 mg Oral Weekly

## 2014-06-19 NOTE — Progress Notes (Signed)
Hypoglycemic Event  CBG: 51  Treatment: D50 IV 25 mL  Symptoms: None  Follow-up CBG: Time:0033 CBG Result:159  Possible Reasons for Event: Unknown  Comments/MD notified:no    Alvin Daniels, Alvin Daniels  Remember to initiate Hypoglycemia Order Set & complete

## 2014-06-19 NOTE — Progress Notes (Signed)
Patient ID: Alvin Daniels, male   DOB: May 07, 1971, 44 y.o.   MRN: [id number] Postoperative day 2 transtibial amputation. Plan for discharge to home versus skilled nursing care. We'll see how patient progresses with therapy.

## 2014-06-19 NOTE — Procedures (Signed)
Late entry: Hemodialysis performed and pt seen while receiving dialysis.  BP a little soft but pt alert and appropriate. No changes needed. Alainah Phang C

## 2014-06-20 ENCOUNTER — Ambulatory Visit: Payer: Self-pay | Admitting: Infectious Diseases

## 2014-06-20 ENCOUNTER — Encounter (HOSPITAL_COMMUNITY): Payer: Self-pay | Admitting: Orthopedic Surgery

## 2014-06-20 LAB — GLUCOSE, CAPILLARY: Glucose-Capillary: 117 mg/dL — ABNORMAL HIGH (ref 70–99)

## 2014-06-20 MED ORDER — OXYCODONE-ACETAMINOPHEN 5-325 MG PO TABS: 1.0000 | ORAL_TABLET | ORAL | Status: DC | PRN

## 2014-06-20 MED ORDER — METHOCARBAMOL 500 MG PO TABS: 500.0000 mg | ORAL_TABLET | Freq: Four times a day (QID) | ORAL | Status: DC | PRN

## 2014-06-20 NOTE — Progress Notes (Signed)
[facility] Progress Note  Assessment/Plan: 1. Osteomyelitis L foot- failed salvage interventions s/p L BKA 2/5 Dr Sharol Given- pain controlled 2. ESRD - TTS NW, HD next on Tuesday - no heparin HD as outpt due to recent surgery 3. Hypotension/volume - on midodrine, under edw, will need lowered at DC/ BP limiting UF. SBP does drop into the 80s at outpt center/asymptomatic; + 140 Saturday with post weight 48.5  4. Anemia - hgb 10 2/6 on Aranesp q 2 weeks, will be due. 2/9 - cont weekly Fe, just finished Fe bolus for tsat 22 - CBC not rechecked prior to d/c 5. Metabolic bone disease - Ca+ 8.1/phos 4.2 cont hectorol, no binder 6. Nutrition - alb 2.1. Renal diet. nepro 7. DM -per primary/hypoglycemia last night 8. Dispo- doing well; home today with brother - doubt he has resources for other care  Myriam Jacobson, PA-C [place] 06/20/2014,10:22 AM  LOS: 3 days   Pt seen, examined and agree w A/P as above.  Kelly Splinter MD pager [phone]    cell [phone] 06/20/2014, 12:00 PM    Subjective:   Pain not bad  Objective Filed Vitals:   06/19/14 0514 06/19/14 1359 06/19/14 2226 06/20/14 0647  BP: 135/86 126/77 123/75 129/83  Pulse: 81 92 85 90  Temp: 98.3 F (36.8 C) 98 F (36.7 C) 99 F (37.2 C) 98.4 F (36.9 C)  TempSrc: Oral Oral Oral Oral  Resp: 16 16 17 16   Height:      Weight:      SpO2: 100% 100% 97% 100%   Physical Exam General: NAD looks great Heart: RRR Lungs: no rales Abdomen: soft Extremities:left BKA - dressing in place Dialysis Access: left AVF + bruit  Dialysis Orders: TTS [facility] 4 hr 49kgs 2k/2.25Ca 400/1.5 160 5000u heparin Aranesp 25 q 2 weeks (last 1/26) hectorol 1 Venofer 10 q week  Additional Objective Labs: Basic Metabolic Panel:  Recent Labs Lab 06/13/14 1406 06/17/14 1218 06/17/14 1225 06/18/14 0749  NA 130* 132* 135 130*  K 4.9 4.3 4.3 4.5  CL 97 97  --  96  CO2 23 28  --  26   GLUCOSE 227* 34* 33* 105*  BUN 26* 18  --  23  CREATININE 6.78* 5.50*  --  6.75*  CALCIUM 8.4 9.0  --  8.1*  PHOS  --   --   --  4.2   Liver Function Tests:  Recent Labs Lab 06/13/14 1406 06/17/14 1218 06/18/14 0749  AST 21 30  --   ALT 9 13  --   ALKPHOS 87 77  --   BILITOT 0.4 0.6  --   PROT 7.0 8.3  --   ALBUMIN 2.7* 2.6* 2.1*  CBC:  Recent Labs Lab 06/13/14 1406 06/17/14 1218 06/17/14 1225 06/18/14 0749  WBC 5.7 9.3  --  4.9  HGB 11.0* 11.5* 13.6 10.0*  HCT 34.5* 35.6* 40.0 30.8*  MCV 95.6 94.2  --  95.1  PLT 209 210  --  185  CBG:  Recent Labs Lab 06/19/14 0727 06/19/14 1130 06/19/14 1655 06/19/14 2223 06/20/14 0742  GLUCAP 153* 153* 165* 90 117*  Medications: . sodium chloride 10 mL/hr at 06/17/14 1850   . aspirin EC  325 mg Oral Daily  . [START ON 06/21/2014] darbepoetin (ARANESP) injection - DIALYSIS  25 mcg Intravenous Q Tue-HD  . Darunavir Ethanolate  800 mg Oral Q breakfast  . docusate sodium  100 mg Oral BID  . [  START ON 06/23/2014] doxercalciferol  1 mcg Intravenous Weekly  . emtricitabine  200 mg Oral Once per day on Tue Sat  . [START ON 06/21/2014] ferric gluconate (FERRLECIT/NULECIT) IV  62.5 mg Intravenous Weekly  . insulin aspart  0-9 Units Subcutaneous TID WC  . insulin aspart  3 Units Subcutaneous TID WC  . midodrine  10 mg Oral TID WC  . multivitamin  1 tablet Oral QHS  . ritonavir  100 mg Oral Q breakfast  . [START ON 06/24/2014] tenofovir  300 mg Oral Weekly

## 2014-06-20 NOTE — Progress Notes (Signed)
Physical Therapy Treatment Patient Details Name: Alvin Daniels MRN: [id number] DOB: [birth year] Today's Date: 06/20/2014    History of Present Illness Pt is a 44 y.o. male s/p Lt BKA.     PT Comments    Pt. Independent with ambulation with good use of RW, but needed two rest breaks for fatigue. Pain was 5/10 and patient let me know when he ready to turn around. Pt. To be d/c'd to home and is safe on transfers and ambulation.   Follow Up Recommendations  Home health PT;Supervision - Intermittent     Equipment Recommendations  Rolling walker with 5" wheels;Wheelchair (measurements PT);3in1 (PT)    Recommendations for Other Services       Precautions / Restrictions Precautions Precautions: Fall Restrictions Weight Bearing Restrictions: Yes LLE Weight Bearing: Non weight bearing    Mobility  Bed Mobility Overal bed mobility: Independent             General bed mobility comments: tactile cues for moving up in the bed  Transfers Overall transfer level: Needs assistance Equipment used: Rolling walker (2 wheeled) Transfers: Sit to/from [facility] Sit to Stand: Supervision Stand pivot transfers: Min guard       General transfer comment: supervision for safety and cues for hand placement.  Ambulation/Gait Ambulation/Gait assistance: Min guard Ambulation Distance (Feet): 90 Feet Assistive device: Rolling walker (2 wheeled) Gait Pattern/deviations: Step-to pattern Gait velocity: decr Gait velocity interpretation: Below normal speed for age/gender General Gait Details: Good balance and use of RW   Stairs            Wheelchair Mobility    Modified Rankin (Stroke Patients Only)       Balance Overall balance assessment: Needs assistance         Standing balance support: During functional activity;Bilateral upper extremity supported Standing balance-Leahy Scale: Poor Standing balance comment: requires RW for balance                     Cognition Arousal/Alertness: Awake/alert Behavior During Therapy: WFL for tasks assessed/performed Overall Cognitive Status: Within Functional Limits for tasks assessed                      Exercises      General Comments        Pertinent Vitals/Pain Pain Score: 5  Pain Location: left LE Pain Descriptors / Indicators: Aching Pain Intervention(s): Monitored during session;Limited activity within patient's tolerance    Home Living                      Prior Function            PT Goals (current goals can now be found in the care plan section) Progress towards PT goals: Progressing toward goals    Frequency  Min 5X/week    PT Plan Discharge plan needs to be updated    Co-evaluation             End of Session Equipment Utilized During Treatment: Gait belt Activity Tolerance: Patient tolerated treatment well Patient left: in chair;with call bell/phone within reach     Time: 1130-1145 PT Time Calculation (min) (ACUTE ONLY): 15 min  Charges:                       G Codes:      Jodi Geralds, SPTA 06/20/2014, 11:59 AM

## 2014-06-20 NOTE — Progress Notes (Signed)
D/c with family friend via wheelchair to home. Spoke with family member Gaylord who sent one of his employees who spoke english to pick up.  All home equipment sent with patient and pain meds and nausea med given at d/c for relief. VSS. Breathing regular and unlabored on room air.  Instructions reviewed with [facility] via phone and with employee picking patient up to go home.  Instructed to call today and ask for Hinton Dyer if any further expalination needed

## 2014-06-20 NOTE — Anesthesia Postprocedure Evaluation (Signed)
  Anesthesia Post-op Note  Patient: Alvin Daniels  Procedure(s) Performed: Procedure(s): AMPUTATION BELOW KNEE (Left)  Patient Location: PACU  Anesthesia Type:General  Level of Consciousness: awake and alert   Airway and Oxygen Therapy: Patient Spontanous Breathing  Post-op Pain: none  Post-op Assessment: Post-op Vital signs reviewed  Post-op Vital Signs: Reviewed  Last Vitals:  Filed Vitals:   06/20/14 0647  BP: 129/83  Pulse: 90  Temp: 36.9 C  Resp: 16    Complications: No apparent anesthesia complications

## 2014-06-20 NOTE — Progress Notes (Addendum)
LCSW facilitated patient getting to [facility] for surgery on 2/5. RN in short stay called to notify patient was spanish speaking only and had no way to hospital. LCSW arranged transportation through [employer] to go and pick patient up from home. Patient typically uses [facility] transportation, however it was not set up in time for appointment/surgery.  LCSW utilized language line in effort to contact patient who was agreeable to plan to get to hospital. Volunteer services were called to pick patient up in emergency room and transport patient to admitting.  No barriers at this time.   Lane Hacker, MSW Clinical Social Work: Emergency Room [phone]

## 2014-06-20 NOTE — Discharge Summary (Signed)
Physician Discharge Summary  Patient ID: Alvin Daniels MRN: [id number] DOB/AGE: Apr 30, 1971 44 y.o.  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 He progressed well with therapy and patient refused discharge to skilled nursing. Patient states that his brother will be able to care for him and he will be able to get to dialysis. Plan for discharge with home health therapy.  Consults: nephrology  Significant Diagnostic Studies: labs: Routine labs  Treatments: dialysis: Hemodialysis and surgery: See operative note  Discharge Exam: Blood pressure 123/75, pulse 85, temperature 99 F (37.2 C), temperature source Oral, resp. rate 17, height 5\' 2"  (1.575 m), weight 48.5 kg (106 lb 14.8 oz), SpO2 97 %. Incision/Wound: dressing clean dry and intact  Disposition: 01-Home or Self Care  Discharge Instructions    Call MD / Call 911    Complete by:  As directed   If you experience chest pain or shortness of breath, CALL 911 and be transported to the hospital emergency room.  If you develope a fever above 101 F, pus (white drainage) or increased drainage or redness at the wound, or calf pain, call your surgeon's office.     Constipation Prevention    Complete by:  As directed   Drink plenty of fluids.  Prune juice may be helpful.  You may use a stool softener, such as Colace (over the counter) 100 mg twice a day.  Use MiraLax (over the counter) for constipation as needed.     Diet - low sodium heart healthy    Complete by:  As directed      Increase activity slowly as tolerated    Complete by:  As directed             Medication List    STOP taking these medications        doxycycline 100 MG capsule  Commonly  known as:  VIBRAMYCIN     levofloxacin 500 MG tablet  Commonly known as:  LEVAQUIN      TAKE these medications        DSS 100 MG Caps  Take 100 mg by mouth 2 (two) times daily.     EMTRIVA 200 MG capsule  Generic drug:  emtricitabine  TAKE 1 CAPSULE BY MOUTH EVERY TUESDAY, AND SATURDAY FOLLOWING DIALYSIS     insulin lispro protamine-lispro (75-25) 100 UNIT/ML Susp injection  Commonly known as:  HUMALOG MIX 75/25  Inject 15 Units into the skin 2 (two) times daily with a meal. 15 units in the am and 15 units in the pm     loperamide 2 MG tablet  Commonly known as:  IMODIUM A-D  Take 1 tablet (2 mg total) by mouth 4 (four) times daily as needed for diarrhea or loose stools.     methocarbamol 500 MG tablet  Commonly known as:  ROBAXIN  Take 1 tablet (500 mg total) by mouth every 6 (six) hours as needed for muscle spasms.     methocarbamol 500 MG tablet  Commonly known as:  ROBAXIN  Take 1 tablet (500 mg total) by mouth every 6 (six) hours as needed for muscle spasms.     midodrine 10 MG tablet  Commonly known as:  PROAMATINE  Take 1 tablet (10 mg total) by mouth 3 (three) times daily with meals.  NORVIR 100 MG Tabs tablet  Generic drug:  ritonavir  TAKE 1 TABLET BY MOUTH EVERY DAY WITH BREAKFAST**MUST TAKE WITH PREZISTA     ondansetron 4 MG tablet  Commonly known as:  ZOFRAN  Take 1 tablet (4 mg total) by mouth every 6 (six) hours as needed for nausea.     oxyCODONE-acetaminophen 5-325 MG per tablet  Commonly known as:  PERCOCET/ROXICET  Take 1 tablet by mouth every 4 (four) hours as needed for moderate pain or severe pain.     oxyCODONE-acetaminophen 5-325 MG per tablet  Commonly known as:  ROXICET  Take 1 tablet by mouth every 4 (four) hours as needed for severe pain.     oxyCODONE-acetaminophen 5-325 MG per tablet  Commonly known as:  PERCOCET/ROXICET  Take 1 tablet by mouth every 4 (four) hours as needed for moderate pain or severe pain.     polyethylene  glycol packet  Commonly known as:  MIRALAX / GLYCOLAX  Take 17 g by mouth daily as needed for moderate constipation or severe constipation.     PREZISTA 800 MG tablet  Generic drug:  Darunavir Ethanolate  TAKE 1 TABLET BY MOUTH EVERY DAY WITH BREAKFAST     sulfamethoxazole-trimethoprim 400-80 MG per tablet  Commonly known as:  BACTRIM,SEPTRA  Take 1 tablet by mouth daily.     tenofovir 300 MG tablet  Commonly known as:  VIREAD  One tablet by mouth weekly every Sunday.           Follow-up Information    Follow up with DUDA,MARCUS V, MD In 1 week.   Specialty:  Orthopedic Surgery   Contact information:   [place] [address] [phone]       Signed: Newt Minion 06/20/2014, 6:31 AM

## 2014-06-20 NOTE — Care Management Note (Signed)
  Page 1 of 1   06/20/2014     1:01:11 PM CARE MANAGEMENT NOTE 06/20/2014  Patient:  Alvin Daniels, Alvin Daniels   Account Number:  [id number]  Date Initiated:  06/20/2014  Documentation initiated by:  Magdalen Spatz  Subjective/Objective Assessment:     Action/Plan:   Anticipated DC Date:  06/20/2014   Anticipated DC Plan:  [place]  CM consult      Choice offered to / List presented to:     DME arranged  3-N-1  [place]      DME agency  [place] arranged  [place]   Status of service:  Completed, signed off Medicare Important Message given?   (If response is "NO", the following Medicare IM given date fields will be blank) Date Medicare IM given:   Medicare IM given by:   Date Additional Medicare IM given:   Additional Medicare IM given by:    Discharge Disposition:  [place]  Per UR Regulation:  Reviewed for med. necessity/level of care/duration of stay  If discussed at [place] of Stay Meetings, dates discussed:    Comments:  06-20-14 Spoke with patient and is friend McGill [phone], list of  home health agencies  provided. Patient does have Medicaid. McGill states patient has 3 to 4 friends who be taking turns staying with patient and providing assistance. Magdalen Spatz RN BSN

## 2014-06-21 ENCOUNTER — Inpatient Hospital Stay: Payer: Self-pay | Admitting: Internal Medicine

## 2014-06-21 LAB — GLUCOSE, CAPILLARY: GLUCOSE-CAPILLARY: 217 mg/dL — AB (ref 70–99)

## 2014-06-24 ENCOUNTER — Encounter: Payer: Self-pay | Admitting: Internal Medicine

## 2014-06-24 ENCOUNTER — Telehealth: Payer: Self-pay

## 2014-06-24 ENCOUNTER — Ambulatory Visit: Payer: Self-pay | Attending: Internal Medicine | Admitting: Internal Medicine

## 2014-06-24 ENCOUNTER — Telehealth: Payer: Self-pay | Admitting: Internal Medicine

## 2014-06-24 VITALS — BP 122/78 | HR 88 | Temp 98.0°F | Resp 16 | Wt 102.6 lb

## 2014-06-24 DIAGNOSIS — Z89512 Acquired absence of left leg below knee: Secondary | ICD-10-CM | POA: Insufficient documentation

## 2014-06-24 DIAGNOSIS — Z794 Long term (current) use of insulin: Secondary | ICD-10-CM | POA: Insufficient documentation

## 2014-06-24 DIAGNOSIS — E1140 Type 2 diabetes mellitus with diabetic neuropathy, unspecified: Secondary | ICD-10-CM | POA: Insufficient documentation

## 2014-06-24 DIAGNOSIS — I120 Hypertensive chronic kidney disease with stage 5 chronic kidney disease or end stage renal disease: Secondary | ICD-10-CM | POA: Insufficient documentation

## 2014-06-24 DIAGNOSIS — E139 Other specified diabetes mellitus without complications: Secondary | ICD-10-CM

## 2014-06-24 DIAGNOSIS — Z792 Long term (current) use of antibiotics: Secondary | ICD-10-CM | POA: Insufficient documentation

## 2014-06-24 DIAGNOSIS — B20 Human immunodeficiency virus [HIV] disease: Secondary | ICD-10-CM | POA: Insufficient documentation

## 2014-06-24 DIAGNOSIS — Z992 Dependence on renal dialysis: Secondary | ICD-10-CM | POA: Insufficient documentation

## 2014-06-24 DIAGNOSIS — N186 End stage renal disease: Secondary | ICD-10-CM | POA: Insufficient documentation

## 2014-06-24 LAB — GLUCOSE, CAPILLARY: Glucose-Capillary: 41 mg/dL — CL (ref 70–99)

## 2014-06-24 LAB — GLUCOSE, POCT (MANUAL RESULT ENTRY)
POC GLUCOSE: 69 mg/dL — AB (ref 70–99)
POC Glucose: 69 mg/dl — AB (ref 70–99)

## 2014-06-24 NOTE — Progress Notes (Signed)
Patient here for follow up on his diabetes Patient goes to dialysis three times a week Patient recently had his left foot amputated eight days ago

## 2014-06-24 NOTE — Telephone Encounter (Signed)
Advice pt to maintain a low carbohydrate diet

## 2014-06-24 NOTE — Telephone Encounter (Signed)
Pt is wondering if he is able to eat "[place]". He is a Paediatric nurse. He is also wanting an update on his diet since he is a diabetic.

## 2014-06-24 NOTE — Progress Notes (Signed)
MRN: [id number] Name: Alvin Daniels  Sex: male Age: 44 y.o. DOB: Dec 25, 1970  Allergies: Review of patient's allergies indicates no known allergies.  Chief Complaint  Patient presents with  . Follow-up    HPI: Patient is 44 y.o. male who  has history of diabetes hypertension, HIV, recently hospitalized with diagnosis of osteomyelitis, patient underwent below-knee amputation, EMR reviewed he was discharged home as per patient he followed up with orthopedics today and had his stump wrapped, today's blood sugar is low as per patient his fasting blood sugar is usually between 80 to 100 mg/dl, today his blood sugar 69 patient is given something to eat denies any headache dizziness chest and shortness of breath.  Past Medical History  Diagnosis Date  . Hepatitis C   . MRSA infection   . Pancreatitis   . Chronic diarrhea   . HIV INFECTION 06/27/2010    Qualifier: Diagnosis of  By: Nickola Major CMA ( [place]), Geni Bers    . Hypotension 06/02/2012  . Metabolic bone disease [id number]  . Normocytic anemia 06/17/2012  . Severe protein-calorie malnutrition 06/17/2012  . Diabetic neuropathy   . ESRD (end stage renal disease) on dialysis     "TTS; don't remember street name" (05/03/2014)  . Renal disorder   . Uncontrolled diabetes mellitus with complications 0000000    Annotation: uncontrolled Qualifier: Diagnosis of  By: Nickola Major CMA ( [facility]), Geni Bers      Past Surgical History  Procedure Laterality Date  . Av fistula placement Left   . Amputation Left 04/20/2014    Procedure: 3rd toe amputation, 4th Toe Amputation,  5th Toe Amputation;  Surgeon: Newt Minion, MD;  Location: [facility];  Service: Orthopedics;  Laterality: Left;  . 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 Amputation Left 05/02/2014    Procedure: Midfoot Amputation;  Surgeon: Newt Minion, MD;  Location: [place];  Service: Orthopedics;  Laterality: Left;  . Amputation Left 06/17/2014    Procedure:  AMPUTATION BELOW KNEE;  Surgeon: Newt Minion, MD;  Location: [facility];  Service: Orthopedics;  Laterality: Left;      Medication List       This list is accurate as of: 06/24/14  1:21 PM.  Always use your most recent med list.               DSS 100 MG Caps  Take 100 mg by mouth 2 (two) times daily.     EMTRIVA 200 MG capsule  Generic drug:  emtricitabine  TAKE 1 CAPSULE BY MOUTH EVERY TUESDAY, AND SATURDAY FOLLOWING DIALYSIS     insulin lispro protamine-lispro (75-25) 100 UNIT/ML Susp injection  Commonly known as:  HUMALOG MIX 75/25  Inject 15 Units into the skin 2 (two) times daily with a meal. 15 units in the am and 15 units in the pm     loperamide 2 MG tablet  Commonly known as:  IMODIUM A-D  Take 1 tablet (2 mg total) by mouth 4 (four) times daily as needed for diarrhea or loose stools.     methocarbamol 500 MG tablet  Commonly known as:  ROBAXIN  Take 1 tablet (500 mg total) by mouth every 6 (six) hours as needed for muscle spasms.     methocarbamol 500 MG tablet  Commonly known as:  ROBAXIN  Take 1 tablet (500 mg total) by mouth every 6 (six) hours as needed for muscle spasms.     midodrine 10 MG tablet  Commonly  known as:  PROAMATINE  Take 1 tablet (10 mg total) by mouth 3 (three) times daily with meals.     NORVIR 100 MG Tabs tablet  Generic drug:  ritonavir  TAKE 1 TABLET BY MOUTH EVERY DAY WITH BREAKFAST**MUST TAKE WITH PREZISTA     ondansetron 4 MG tablet  Commonly known as:  ZOFRAN  Take 1 tablet (4 mg total) by mouth every 6 (six) hours as needed for nausea.     oxyCODONE-acetaminophen 5-325 MG per tablet  Commonly known as:  PERCOCET/ROXICET  Take 1 tablet by mouth every 4 (four) hours as needed for moderate pain or severe pain.     oxyCODONE-acetaminophen 5-325 MG per tablet  Commonly known as:  ROXICET  Take 1 tablet by mouth every 4 (four) hours as needed for severe pain.     oxyCODONE-acetaminophen 5-325 MG per tablet  Commonly known as:   PERCOCET/ROXICET  Take 1 tablet by mouth every 4 (four) hours as needed for moderate pain or severe pain.     polyethylene glycol packet  Commonly known as:  MIRALAX / GLYCOLAX  Take 17 g by mouth daily as needed for moderate constipation or severe constipation.     PREZISTA 800 MG tablet  Generic drug:  Darunavir Ethanolate  TAKE 1 TABLET BY MOUTH EVERY DAY WITH BREAKFAST     sulfamethoxazole-trimethoprim 400-80 MG per tablet  Commonly known as:  BACTRIM,SEPTRA  Take 1 tablet by mouth daily.     tenofovir 300 MG tablet  Commonly known as:  VIREAD  One tablet by mouth weekly every Sunday.        No orders of the defined types were placed in this encounter.    Immunization History  Administered Date(s) Administered  . Hepatitis A 01/03/2010  . Hepatitis B, adult/adol-2 dose 05/20/2013, 06/22/2013, 07/19/2013  . Hepatitis B, ped/adol 04/16/2013  . Influenza Whole 01/03/2010, 02/11/2011, 03/13/2013  . Influenza-Unspecified 02/16/2014  . Pneumococcal Conjugate-13 12/01/2009  . Pneumococcal-Unspecified 11/05/2012    Family History  Problem Relation Age of Onset  . Diabetes Mother   . Diabetes Father     History  Substance Use Topics  . Smoking status: Never Smoker   . Smokeless tobacco: Never Used  . Alcohol Use: No    Review of Systems   As noted in HPI  Filed Vitals:   06/24/14 1102  BP: 122/78  Pulse: 88  Temp: 98 F (36.7 C)  Resp: 16    Physical Exam  Physical Exam  Constitutional:  Patient is sitting comfortably not in acute distress  Eyes: EOM are normal. Pupils are equal, round, and reactive to light.  Neck: Neck supple.  Cardiovascular: Normal rate and regular rhythm.   Pulmonary/Chest: Breath sounds normal. No respiratory distress. He has no wheezes. He has no rales.  Musculoskeletal:  Left BKA stump wrapped    CBC    Component Value Date/Time   WBC 4.9 06/18/2014 0749   RBC 3.24* 06/18/2014 0749   RBC 3.21* 03/12/2010 1250   HGB  10.0* 06/18/2014 0749   HCT 30.8* 06/18/2014 0749   PLT 185 06/18/2014 0749   MCV 95.1 06/18/2014 0749   LYMPHSABS 1.2 06/10/2014 1610   MONOABS 0.6 06/10/2014 1610   EOSABS 0.1 06/10/2014 1610   BASOSABS 0.0 06/10/2014 1610    CMP     Component Value Date/Time   NA 130* 06/18/2014 0749   K 4.5 06/18/2014 0749   CL 96 06/18/2014 0749   CO2 26 06/18/2014 0749  GLUCOSE 105* 06/18/2014 0749   BUN 23 06/18/2014 0749   CREATININE 6.75* 06/18/2014 0749   CREATININE 6.78* 06/13/2014 1406   CALCIUM 8.1* 06/18/2014 0749   CALCIUM 8.5 03/12/2010 1250   PROT 8.3 06/17/2014 1218   ALBUMIN 2.1* 06/18/2014 0749   AST 30 06/17/2014 1218   ALT 13 06/17/2014 1218   ALKPHOS 77 06/17/2014 1218   BILITOT 0.6 06/17/2014 1218   GFRNONAA 9* 06/18/2014 0749   GFRNONAA 12* 10/12/2010 1032   GFRAA 10* 06/18/2014 0749   GFRAA 14* 10/12/2010 1032    Lab Results  Component Value Date/Time   CHOL 81 06/13/2014 02:06 PM    No components found for: HGA1C  Lab Results  Component Value Date/Time   AST 30 06/17/2014 12:18 PM    Assessment and Plan  Other specified diabetes mellitus without complications - Plan:  Results for orders placed or performed in visit on 06/24/14  Glucose (CBG)  Result Value Ref Range   POC Glucose 69 (A) 70 - 99 mg/dl  Glucose (CBG)  Result Value Ref Range   POC Glucose 69 (A) 70 - 99 mg/dl   Patient reported to have low blood sugar levels, I have advised patient to cut down insulin to 13 units twice a day, as per patient his blood sugar levels are being checked at  dialysis Center.  Status post below knee amputation of left lower extremity Patient is being followed up by orthopedics and has documented by case manager patient to have  Home health nurse for wound care  End stage renal failure on dialysis Currently on dialysis Tuesday Thursday and Saturdays  Human immunodeficiency virus (HIV) disease Patient following up with ID.    Return in about 3  months (around 09/22/2014) for diabetes.  Lorayne Marek, MD

## 2014-06-30 ENCOUNTER — Encounter: Payer: Self-pay | Admitting: *Deleted

## 2014-07-01 ENCOUNTER — Encounter: Payer: Self-pay | Admitting: Infectious Diseases

## 2014-07-01 ENCOUNTER — Telehealth: Payer: Self-pay | Admitting: Internal Medicine

## 2014-07-01 ENCOUNTER — Ambulatory Visit (INDEPENDENT_AMBULATORY_CARE_PROVIDER_SITE_OTHER): Payer: Self-pay | Admitting: Infectious Diseases

## 2014-07-01 VITALS — BP 79/45 | HR 90 | Temp 98.0°F

## 2014-07-01 DIAGNOSIS — N186 End stage renal disease: Secondary | ICD-10-CM

## 2014-07-01 DIAGNOSIS — B20 Human immunodeficiency virus [HIV] disease: Secondary | ICD-10-CM

## 2014-07-01 DIAGNOSIS — B1920 Unspecified viral hepatitis C without hepatic coma: Secondary | ICD-10-CM

## 2014-07-01 DIAGNOSIS — E1122 Type 2 diabetes mellitus with diabetic chronic kidney disease: Secondary | ICD-10-CM

## 2014-07-01 DIAGNOSIS — E1322 Other specified diabetes mellitus with diabetic chronic kidney disease: Secondary | ICD-10-CM

## 2014-07-01 NOTE — Assessment & Plan Note (Signed)
My great appreciation to Dr Sharol Given.  He appears to be doing well though I did not remove his dressings.

## 2014-07-01 NOTE — Telephone Encounter (Signed)
Pt requesting wound care. Pt Stated had appointment with specialist today and dressing was not change

## 2014-07-01 NOTE — Telephone Encounter (Signed)
Pt called wanting to speak to nurse about wound care . Please f/u with pt

## 2014-07-01 NOTE — Assessment & Plan Note (Signed)
Will refill his insulin.

## 2014-07-01 NOTE — Assessment & Plan Note (Signed)
He appears to be doing well.  Continue his current ART and bactrim.  rtc in 1 month.  Needs dental.

## 2014-07-01 NOTE — Assessment & Plan Note (Signed)
Will have him eval by pharmacy for [facility]

## 2014-07-01 NOTE — Addendum Note (Signed)
Addended by: Geniyah Eischeid C on: 07/01/2014 10:47 AM   Modules accepted: Level of Service

## 2014-07-01 NOTE — Assessment & Plan Note (Signed)
Appreciate renal f/u. He is asx from his BP today. Will f/u tomorrow at HD.

## 2014-07-01 NOTE — Progress Notes (Signed)
   Subjective:    Patient ID: Alvin Daniels, male    DOB: 27-Dec-1970, 44 y.o.   MRN: [id number]  HPI 44 yo M with DM, ESRD, Hep C and HIV+. Was in hospital 12-4 to 04-22-13 due to repeat episode of hypotension, falls. He was started on midodrine. Was previously on DRVr/TFV/EMT. He also has a hx of a RLE wound.  He came to ED on 12-7 with black L 4th toe for 1 week.  His hospital course was also notable for NSVT and hypotension.  He underwent amputation of L 3/4/5 toes and metatarsals on 12-9. He also had drainage of a large abscess, no Cx sent. He was d/c home on levaquin for 1 month. He returned to hospital on 12-21 when he developed gangrene and required mid- foot amputation.   He was seen in ID f/u January 2016 and his wound looked poor. He was then seen by orthopedics. He underwent BKA 06-17-14.   He has some pain today. Has had MD f/u and had dressing taken down once, was told not to take down/clean.  No problems with ART. He was in rehab for 3 days after surgery.  Feels like he is getting around ok.  Has had w/u for Hep C (u/s F2+/3).  Had HD yesterday. Has no sx from his low BP. States when he is seen at HD is BP is fine.  Does not make urine.  FSG was 80 yesterday.   Review of Systems  Constitutional: Negative for fever and chills.  Respiratory: Negative for shortness of breath.   Cardiovascular: Negative for chest pain.  Gastrointestinal: Negative for diarrhea and constipation.  Neurological: Negative for dizziness, light-headedness and headaches.      Objective:   Physical Exam  Constitutional: He appears well-developed and well-nourished.  Eyes: EOM are normal. Pupils are equal, round, and reactive to light.  Neck: Neck supple.  Cardiovascular: Normal rate, regular rhythm and normal heart sounds.   Pulmonary/Chest: Effort normal and breath sounds normal.  Abdominal: Soft. Bowel sounds are normal. He exhibits no distension. There is no tenderness.    Musculoskeletal:       Legs: Lymphadenopathy:    He has no cervical adenopathy.          Assessment & Plan:

## 2014-07-04 NOTE — Telephone Encounter (Signed)
Alvin Daniels can you check if patient got home health nurse for wound care

## 2014-07-05 ENCOUNTER — Other Ambulatory Visit: Payer: Self-pay | Admitting: Infectious Diseases

## 2014-07-05 ENCOUNTER — Encounter (HOSPITAL_COMMUNITY): Payer: Self-pay | Admitting: Emergency Medicine

## 2014-07-05 ENCOUNTER — Emergency Department (HOSPITAL_COMMUNITY)
Admission: EM | Admit: 2014-07-05 | Discharge: 2014-07-05 | Disposition: A | Discharge status: Home / Self Care | Payer: Medicaid Other | Attending: Emergency Medicine | Admitting: Emergency Medicine

## 2014-07-05 DIAGNOSIS — Z8679 Personal history of other diseases of the circulatory system: Secondary | ICD-10-CM | POA: Insufficient documentation

## 2014-07-05 DIAGNOSIS — Z5189 Encounter for other specified aftercare: Secondary | ICD-10-CM

## 2014-07-05 DIAGNOSIS — Z992 Dependence on renal dialysis: Secondary | ICD-10-CM | POA: Insufficient documentation

## 2014-07-05 DIAGNOSIS — Z8614 Personal history of Methicillin resistant Staphylococcus aureus infection: Secondary | ICD-10-CM | POA: Insufficient documentation

## 2014-07-05 DIAGNOSIS — E1140 Type 2 diabetes mellitus with diabetic neuropathy, unspecified: Secondary | ICD-10-CM | POA: Insufficient documentation

## 2014-07-05 DIAGNOSIS — Z89511 Acquired absence of right leg below knee: Secondary | ICD-10-CM | POA: Insufficient documentation

## 2014-07-05 DIAGNOSIS — Z792 Long term (current) use of antibiotics: Secondary | ICD-10-CM | POA: Insufficient documentation

## 2014-07-05 DIAGNOSIS — Z79899 Other long term (current) drug therapy: Secondary | ICD-10-CM | POA: Insufficient documentation

## 2014-07-05 DIAGNOSIS — Z8619 Personal history of other infectious and parasitic diseases: Secondary | ICD-10-CM | POA: Insufficient documentation

## 2014-07-05 DIAGNOSIS — Z862 Personal history of diseases of the blood and blood-forming organs and certain disorders involving the immune mechanism: Secondary | ICD-10-CM | POA: Insufficient documentation

## 2014-07-05 DIAGNOSIS — Z8719 Personal history of other diseases of the digestive system: Secondary | ICD-10-CM | POA: Insufficient documentation

## 2014-07-05 DIAGNOSIS — Z4801 Encounter for change or removal of surgical wound dressing: Secondary | ICD-10-CM | POA: Insufficient documentation

## 2014-07-05 DIAGNOSIS — N186 End stage renal disease: Secondary | ICD-10-CM | POA: Insufficient documentation

## 2014-07-05 DIAGNOSIS — Z794 Long term (current) use of insulin: Secondary | ICD-10-CM | POA: Insufficient documentation

## 2014-07-05 DIAGNOSIS — Z21 Asymptomatic human immunodeficiency virus [HIV] infection status: Secondary | ICD-10-CM | POA: Insufficient documentation

## 2014-07-05 NOTE — Progress Notes (Signed)
Case  manager contacted by ED PA for wound care center assistance for patient Recommended PA contact ED secretary to make patient an appointment with wound care center

## 2014-07-05 NOTE — ED Notes (Signed)
Patient had left leg amputated in December 2015, states that his doctor told him to return for cleaning of his wound. Patient denies any problems with wound.

## 2014-07-05 NOTE — Discharge Instructions (Signed)
Is important for you to follow-up with your PCP within the next 48 hours for further evaluation and management of your symptoms. You been given an appointment with wound care center on March 11 at 9:15 AM. It is important for you to keep this appointment for further evaluation and management of your symptoms. Return to ED for new or worsening symptoms.

## 2014-07-05 NOTE — ED Provider Notes (Signed)
CSN: [id number]     Arrival date & time 07/05/14  1201 History  This chart was scribed for non-physician practitioner, Comer Locket, PA-C, working with Debby Freiberg, MD by Ladene Artist, ED Scribe. This patient was seen in room WTR6/WTR6 and the patient's care was started at 12:57PM.   Chief Complaint  Patient presents with  . Wound Check   The history is provided by the patient. The history is limited by a language barrier. A language interpreter was used.   HPI Comments: Alvin Daniels is a 44 y.o. male, with a h/o DM, HIV, hypotension, metabolic bone disease, neuropathy, hepatitis C, ESRD, who presents to the Emergency Department for a wound check. Pt had L below-knee amputation in December 123456 due to complications with DM. Pt is followed by PCP: Lorayne Marek, MD and Meridee Score, MD. Pt was last seen on 06/27/14 when he had his dressing removed and was advised to follow-up with wound care. Pt states that he has not been able to find a wound care center. He denies fever, tenderness to the area, drainage, foul odors, chest pain, SOB, abdominal pain. Pt states that he is currently taking antibiotics. No other modifying factors.  Past Medical History  Diagnosis Date  . Hepatitis C   . MRSA infection   . Pancreatitis   . Chronic diarrhea   . HIV INFECTION 06/27/2010    Qualifier: Diagnosis of  By: Nickola Major CMA ( [place]), Geni Bers    . Hypotension 06/02/2012  . Metabolic bone disease [id number]  . Normocytic anemia 06/17/2012  . Severe protein-calorie malnutrition 06/17/2012  . Diabetic neuropathy   . ESRD (end stage renal disease) on dialysis     "TTS; don't remember street name" (05/03/2014)  . Renal disorder   . Uncontrolled diabetes mellitus with complications 0000000    Annotation: uncontrolled Qualifier: Diagnosis of  By: Nickola Major CMA ( AAMA), Geni Bers     Past Surgical History  Procedure Laterality Date  . Av fistula placement Left   . Amputation Left 04/20/2014     Procedure: 3rd toe amputation, 4th Toe Amputation,  5th Toe Amputation;  Surgeon: Newt Minion, MD;  Location: [place];  Service: Orthopedics;  Laterality: Left;  . 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 Amputation Left 05/02/2014    Procedure: Midfoot Amputation;  Surgeon: Newt Minion, MD;  Location: [place];  Service: Orthopedics;  Laterality: Left;  . Amputation Left 06/17/2014    Procedure: AMPUTATION BELOW KNEE;  Surgeon: Newt Minion, MD;  Location: [place];  Service: Orthopedics;  Laterality: Left;   Family History  Problem Relation Age of Onset  . Diabetes Mother   . Diabetes Father    History  Substance Use Topics  . Smoking status: Never Smoker   . Smokeless tobacco: Never Used  . Alcohol Use: No    Review of Systems  Constitutional: Negative for fever.  HENT: Negative for sore throat.   Eyes: Negative for visual disturbance.  Respiratory: Negative for shortness of breath.   Cardiovascular: Negative for chest pain.  Gastrointestinal: Negative for abdominal pain.  Endocrine: Negative for polyuria.  Genitourinary: Negative for dysuria.  Skin: Positive for wound. Negative for rash.  Neurological: Negative for headaches.   Allergies  Review of patient's allergies indicates no known allergies.  Home Medications   Prior to Admission medications   Medication Sig Start Date End Date Taking? Authorizing Provider  docusate sodium 100 MG CAPS Take 100 mg by mouth 2 (  two) times daily. Patient not taking: Reported on 05/02/2014 04/22/14   Dimas Chyle, MD  EMTRIVA 200 MG capsule TAKE 1 CAPSULE BY MOUTH EVERY TUESDAY, AND SATURDAY FOLLOWING DIALYSIS 05/11/14   Campbell Riches, MD  insulin lispro protamine-lispro (HUMALOG MIX 75/25) (75-25) 100 UNIT/ML SUSP injection Inject 15 Units into the skin 2 (two) times daily with a meal. 15 units in the am and 15 units in the pm 10/25/13   Lorayne Marek, MD  loperamide (IMODIUM A-D) 2 MG tablet Take 1 tablet (2 mg  total) by mouth 4 (four) times daily as needed for diarrhea or loose stools. Patient not taking: Reported on 05/02/2014 02/23/14   Campbell Riches, MD  methocarbamol (ROBAXIN) 500 MG tablet Take 1 tablet (500 mg total) by mouth every 6 (six) hours as needed for muscle spasms. 04/22/14   Dimas Chyle, MD  methocarbamol (ROBAXIN) 500 MG tablet Take 1 tablet (500 mg total) by mouth every 6 (six) hours as needed for muscle spasms. 06/20/14   Newt Minion, MD  midodrine (PROAMATINE) 10 MG tablet Take 1 tablet (10 mg total) by mouth 3 (three) times daily with meals. 04/22/14   Dimas Chyle, MD  NORVIR 100 MG TABS tablet TAKE 1 TABLET BY MOUTH EVERY DAY WITH BREAKFAST**MUST TAKE WITH PREZISTA 05/11/14   Campbell Riches, MD  ondansetron (ZOFRAN) 4 MG tablet Take 1 tablet (4 mg total) by mouth every 6 (six) hours as needed for nausea. Patient not taking: Reported on 05/02/2014 04/22/14   Dimas Chyle, MD  oxyCODONE-acetaminophen (PERCOCET/ROXICET) 5-325 MG per tablet Take 1 tablet by mouth every 4 (four) hours as needed for moderate pain or severe pain. 04/24/14   Leone Haven, MD  oxyCODONE-acetaminophen (PERCOCET/ROXICET) 5-325 MG per tablet Take 1 tablet by mouth every 4 (four) hours as needed for moderate pain or severe pain. 06/20/14   Newt Minion, MD  oxyCODONE-acetaminophen (ROXICET) 5-325 MG per tablet Take 1 tablet by mouth every 4 (four) hours as needed for severe pain. 05/03/14   Newt Minion, MD  polyethylene glycol (MIRALAX / Floria Raveling) packet Take 17 g by mouth daily as needed for moderate constipation or severe constipation. Patient not taking: Reported on 05/02/2014 04/22/14   Dimas Chyle, MD  PREZISTA 800 MG tablet TAKE 1 TABLET BY MOUTH EVERY DAY WITH BREAKFAST 05/11/14   Campbell Riches, MD  sulfamethoxazole-trimethoprim (BACTRIM,SEPTRA) 400-80 MG per tablet Take 1 tablet by mouth daily. 04/24/14   Alveda Reasons, MD  tenofovir Veva Holes) 300 MG tablet One tablet by mouth weekly  every Sunday. 01/27/14   Carlyle Basques, MD  VIREAD 300 MG tablet TAKE 1 TABLET BY MOUTH EVERY WEEK ON SUNDAYS 07/05/14   Campbell Riches, MD   Pulse 82  Temp(Src) 97.5 F (36.4 C) (Oral)  Resp 16  SpO2 100% Physical Exam  Constitutional: He is oriented to person, place, and time. He appears well-developed and well-nourished. No distress.  HENT:  Head: Normocephalic and atraumatic.  Mouth/Throat: Oropharynx is clear and moist.  Eyes: Conjunctivae and EOM are normal. Pupils are equal, round, and reactive to light. Right eye exhibits no discharge. Left eye exhibits no discharge. No scleral icterus.  Neck: Neck supple. No tracheal deviation present.  Cardiovascular: Normal rate, regular rhythm and normal heart sounds.   Pulmonary/Chest: Effort normal and breath sounds normal. No respiratory distress. He has no wheezes. He has no rales.  Abdominal: Soft. There is no tenderness.  Musculoskeletal: Normal range of motion. He exhibits  no tenderness.  Neurological: He is alert and oriented to person, place, and time.  Cranial Nerves II-XII grossly intact  Skin: Skin is warm and dry. No rash noted.  Below the knee L lower extremity amputation that appears to be healing well. No erythema, overt swelling or drainage. The area is nontender with no overt warmth. No streaking or swelling noted above amputation site.   Psychiatric: He has a normal mood and affect. His behavior is normal.  Nursing note and vitals reviewed.  ED Course  Procedures (including critical care time) DIAGNOSTIC STUDIES: Oxygen Saturation is 100% on RA, normal by my interpretation.    COORDINATION OF CARE: 1:10 PM-Discussed treatment plan which includes referral to wound care with pt at bedside and pt agreed to plan.   Labs Review Labs Reviewed - No data to display  Imaging Review No results found.   EKG Interpretation None     Meds given in ED:  Medications - No data to display  New Prescriptions   No  medications on file   Filed Vitals:   07/05/14 1222  Pulse: 82  Temp: 97.5 F (36.4 C)  TempSrc: Oral  Resp: 16  SpO2: 100%    MDM  Vitals stable - WNL -afebrile Pt resting comfortably in ED. patient appears well and in no apparent distress. Low concern for sepsis. PE--no evidence of active infection to wound site.  Nursing provided some generalized wound cleaning to area.  Patient has been scheduled for an appointment with wound care on March 11 at 9:15 AM. He has been given a copy of this appointment date and is encouraged to keep this appointment for further evaluation and management of his symptoms. Also discussed follow-up with his surgeon, Dr. Sharol Given  I discussed all relevant lab findings and imaging results with pt and they verbalized understanding. Discussed f/u with PCP within 48 hrs and return precautions, pt very amenable to plan.  Final diagnoses:  Visit for wound check    I personally performed the services described in this documentation, which was scribed in my presence. The recorded information has been reviewed and is accurate.    Viona Gilmore [place], PA-C 07/05/14 2041  Debby Freiberg, MD 07/07/14 [phone]

## 2014-07-06 ENCOUNTER — Inpatient Hospital Stay: Payer: Self-pay | Admitting: Infectious Diseases

## 2014-07-08 ENCOUNTER — Telehealth: Payer: Self-pay | Admitting: Internal Medicine

## 2014-07-08 ENCOUNTER — Ambulatory Visit: Payer: Self-pay | Admitting: Internal Medicine

## 2014-07-08 NOTE — Telephone Encounter (Signed)
Pt came in for f/u appt today with pcp an hour and a half late because he was scheduled for another appointment today earlier in the morning and uses [facility] for transportation. If case worker calls again

## 2014-07-20 ENCOUNTER — Ambulatory Visit: Payer: Self-pay | Admitting: Internal Medicine

## 2014-07-22 ENCOUNTER — Encounter (HOSPITAL_BASED_OUTPATIENT_CLINIC_OR_DEPARTMENT_OTHER): Payer: Medicaid Other | Attending: Internal Medicine

## 2014-07-22 DIAGNOSIS — T8189XA Other complications of procedures, not elsewhere classified, initial encounter: Secondary | ICD-10-CM | POA: Insufficient documentation

## 2014-07-22 DIAGNOSIS — T879 Unspecified complications of amputation stump: Secondary | ICD-10-CM | POA: Insufficient documentation

## 2014-07-22 DIAGNOSIS — Z89512 Acquired absence of left leg below knee: Secondary | ICD-10-CM | POA: Insufficient documentation

## 2014-07-22 DIAGNOSIS — S58012A Complete traumatic amputation at elbow level, left arm, initial encounter: Secondary | ICD-10-CM | POA: Insufficient documentation

## 2014-07-22 DIAGNOSIS — S88012A Complete traumatic amputation at knee level, left lower leg, initial encounter: Secondary | ICD-10-CM | POA: Insufficient documentation

## 2014-07-22 DIAGNOSIS — Z89212 Acquired absence of left upper limb below elbow: Secondary | ICD-10-CM | POA: Insufficient documentation

## 2014-07-22 DIAGNOSIS — E1140 Type 2 diabetes mellitus with diabetic neuropathy, unspecified: Secondary | ICD-10-CM | POA: Insufficient documentation

## 2014-07-25 ENCOUNTER — Ambulatory Visit: Payer: Self-pay

## 2014-07-29 ENCOUNTER — Telehealth: Payer: Self-pay | Admitting: Internal Medicine

## 2014-07-29 ENCOUNTER — Other Ambulatory Visit: Payer: Self-pay | Admitting: Infectious Diseases

## 2014-07-29 ENCOUNTER — Encounter: Payer: Self-pay | Admitting: Family Medicine

## 2014-07-29 ENCOUNTER — Ambulatory Visit: Payer: Medicaid Other | Attending: Family Medicine | Admitting: Family Medicine

## 2014-07-29 VITALS — BP 108/73 | HR 86 | Temp 97.9°F | Resp 16 | Ht 62.99 in | Wt 101.0 lb

## 2014-07-29 DIAGNOSIS — Z89512 Acquired absence of left leg below knee: Secondary | ICD-10-CM | POA: Insufficient documentation

## 2014-07-29 DIAGNOSIS — N186 End stage renal disease: Secondary | ICD-10-CM | POA: Insufficient documentation

## 2014-07-29 DIAGNOSIS — Z21 Asymptomatic human immunodeficiency virus [HIV] infection status: Secondary | ICD-10-CM | POA: Insufficient documentation

## 2014-07-29 DIAGNOSIS — B20 Human immunodeficiency virus [HIV] disease: Secondary | ICD-10-CM

## 2014-07-29 DIAGNOSIS — Z9114 Patient's other noncompliance with medication regimen: Secondary | ICD-10-CM | POA: Insufficient documentation

## 2014-07-29 DIAGNOSIS — M2669 Other specified disorders of temporomandibular joint: Secondary | ICD-10-CM | POA: Insufficient documentation

## 2014-07-29 DIAGNOSIS — M26609 Unspecified temporomandibular joint disorder, unspecified side: Secondary | ICD-10-CM

## 2014-07-29 DIAGNOSIS — B1920 Unspecified viral hepatitis C without hepatic coma: Secondary | ICD-10-CM | POA: Insufficient documentation

## 2014-07-29 DIAGNOSIS — E119 Type 2 diabetes mellitus without complications: Secondary | ICD-10-CM | POA: Insufficient documentation

## 2014-07-29 DIAGNOSIS — Z992 Dependence on renal dialysis: Secondary | ICD-10-CM | POA: Insufficient documentation

## 2014-07-29 DIAGNOSIS — M2660 Temporomandibular joint disorder, unspecified: Secondary | ICD-10-CM

## 2014-07-29 LAB — POCT GLYCOSYLATED HEMOGLOBIN (HGB A1C): Hemoglobin A1C: 5.9

## 2014-07-29 LAB — GLUCOSE, POCT (MANUAL RESULT ENTRY): POC Glucose: 92 mg/dl (ref 70–99)

## 2014-07-29 MED ORDER — NAPROXEN 250 MG PO TABS: 250.0000 mg | ORAL_TABLET | Freq: Two times a day (BID) | ORAL | Status: DC | PRN

## 2014-07-29 MED ORDER — INSULIN LISPRO PROT & LISPRO (75-25 MIX) 100 UNIT/ML ~~LOC~~ SUSP: 5.0000 [IU] | Freq: Two times a day (BID) | SUBCUTANEOUS | Status: DC

## 2014-07-29 NOTE — Progress Notes (Signed)
Pt is here following up on his diabetes. Pt report having no C.C. Today.

## 2014-07-29 NOTE — Progress Notes (Signed)
Subjective:    Patient ID: Alvin Daniels, male    DOB: Jul 30, 1970, 44 y.o.   MRN: [id number]  HPI Patient is seen with the [place] interpreter Elinor Parkinson for a follow up of his DM and has been non compliant with his Insulin due to financial constraints.  CHRONIC DIABETES  Disease Monitoring  Blood Sugar Ranges: does not check his sugars  Polyuria: no   Visual problems: no   Medication Compliance: no  Medication Side Effects  Hypoglycemia: no   Preventitive Health Care  Eye Exam: No  Foot Exam:No  Diet pattern: unable to afford ADA diet  Exercise:Unable to exercise due to L BKA             Pnemovax: states he received it at his Dialysis center and has been advised             to bring in documentation at his next office visit.  PMH is also notable for Hep C, HIV+ (remains on antiretroviral therapy),  ESRD and remains on Hemo dialysis  He is status post Left BKA and sees wound care for dressing of some ulcers which he has on his stump an also Infectious disease He does complain of occasional pain in his jaw when chewing  Lab Results  Component Value Date   HGBA1C 5.90 07/29/2014      Review of Systems  Review of Systems - General ROS: negative Psychological ROS: negative Ophthalmic ROS: negative Endocrine ROS: negative Respiratory ROS: no cough, shortness of breath, or wheezing Cardiovascular ROS: no chest pain or dyspnea on exertion Gastrointestinal ROS: no abdominal pain, change in bowel habits, or black or bloody stools Genito-Urinary ROS: no dysuria, trouble voiding, or hematuria Musculoskeletal ROS: positive for - left stump ulcer Neurological ROS: negative           Objective:   Physical Exam  BP 108/73 mmHg   Pulse 86   Temp(Src) 97.9 F (36.6 C) (Oral)   Resp 16   Ht 5' 2.99" (1.6 m)   Wt 101 lb (45.813 kg)   BMI 17.90 kg/m2   SpO2 100%  General Appearance:    Alert, cooperative, no distress, appears stated age  Head:    Normocephalic,  without obvious abnormality, atraumatic  Eyes:    PERRL, conjunctiva/corneas clear, EOM's intact, fundi    benign, both eyes       Ears:    Normal TM's and external ear canals, both ears  Nose:   Nares normal, septum midline, mucosa normal, no drainage   or sinus tenderness  Throat:   Lips, mucosa, and tongue normal; teeth and gums normal Clicking of TMJ joint on opening and closing of the mouth.  Neck:   Supple, symmetrical, trachea midline, no adenopathy;       thyroid:  No enlargement/tenderness/nodules; no carotid   bruit or JVD  Back:     Symmetric, no curvature, ROM normal, no CVA tenderness  Lungs:     Clear to auscultation bilaterally, respirations unlabored  Chest wall:    No tenderness or deformity  Heart:    Regular rate and rhythm, S1 and S2 normal, no murmur, rub   or gallop  Abdomen:     Soft, non-tender, bowel sounds active all four quadrants,    no masses, no organomegaly        Extremities:   Extremity normal on the right, left BKA  Pulses:   1+ on right Dorsalis  Skin:  Left fore arm AV fistula,  palpable thrill.   Lymph nodes:   Cervical, supraclavicular, and axillary nodes normal  Neurologic:   CNII-XII intact. Normal strength, sensation and reflexes      throughout           Assessment & Plan:  Diabetes Mellitus, Type 2 : Controlled Will reduce the dose of his Lispro from 14 to 5 units bid to reduce the risk of Hypoglycemia given his Hba1c id 5.9 and blood sugar is normal despite being off on Insulin. Keep blood sugar logs as instructed Will refer to Opthalmology for eye exam. Foot exam performed today..  End Stage renal Dialysis Hemodialysis as per Protocol  HIV: Remains on antiretroviral medications  Hepatitis C: Stable  Below knee amputation with stump ulcer: Has a wound dressing appointment today so I did not open his dressing Keep scheduled appointment.  TMJ dysfunction NSAIDS

## 2014-07-29 NOTE — Telephone Encounter (Signed)
Alvin Daniels is wanting an Conservator, museum/gallery. Please follow up with pt to determine if this is possible and the steps he needs to take to obtain one.

## 2014-07-29 NOTE — Patient Instructions (Signed)
Advised on ADA , DASH, low sodium, low cholesterol diet. Dieta para pacientes en dilisis  (Dialysis Diet) Esta es una dieta especial par seguir cuando se inicia la dilisis peritoneal o la hemodilisis. Los alimentos deben ser elegidos cuidadosamente debido a que alimentos diferentes producen diferentes residuos en la [place]. Si usted est en tratamiento de dilisis, sus riones han dejado de funcionar correctamente por s solos. Esto significa que los riones estn eliminando muy pocos o ningn residuo de la [place]. La dilisis cumple la funcin de un rin sano para filtrar los desechos de su cuerpo. Sin embargo, entre una y otra sesin de dilisis, los desechos se acumulan en la sangre y pueden causar enfermedades. Usted puede reducir la cantidad de desechos que se acumulan en la sangre controlando lo que come y bebe. Un buen plan de comidas puede mejorar la dilisis y su salud. Su dietista o nutricionista renal pueden ayudar a planificar sus comidas.  LQUIDOS  Entre una y otra sesin de dilisis, los riones pueden extraer algo o nada de lquido. El lquido puede acumularse y causar hinchazn y [place]. El exceso de lquido afecta su presin arterial y puede hacer que su corazn trabaje ms. Sobrecargar el cuerpo de lquido podra causar problemas cardacos graves. Todas las Engineer, manufacturing en dilisis pueden tomar una cantidad diferente de lquido por Training and development officer. Hable con su dietista sobre la cantidad de lquido que puede tomar por da y antelo.  Los alimentos que aumentan la ingesta de lquidos son:   Los alimentos que contienen lquidos a Engineer, water, tales como sopa, postre de gelatina y el helado.  Frutas y verduras, [place] los [place], uvas, [place], [place], [place], Acupuncturist y apio. Su nutricionista podr darle otros consejos para controlar la sed.  POTASIO  El potasio es un mineral que se encuentra en muchos alimentos, especialmente [place], frutas y Photographer. Afecta el ritmo [place] niveles de potasio pueden aumentar entre las sesiones de dilisis y esto puede influir en la frecuencia cardaca y puede poner en peligro la vida.  Evite los alimentos ricos en potasio. Si usted come alimentos ricos en potasio, tome porciones ms pequeas. Por ejemplo, coma la mitad de Dietitian en lugar de una pera entera. Coma slo porciones muy pequeas de naranjas y melones. Puede eliminar parte del potasio de las papas y otros vegetales al pelarlas y Tax adviser en una gran cantidad de agua durante varias horas. Escurra y enjuague antes de cocinar. Hable con un dietista sobre los alimentos que puede comer en lugar de alimentos ricos en potasio.  Los alimentos y bebidas ricos en potasio son:   Duraznos.  Repollitos de Bruselas.  Dtiles.  Porotos de [place].  Naranjas.  Jugo de ciruelas.  [place].  [place].  Leche.  Higos.  Melones.  Manes.  Ciruelas.  Tomates.  Bananas.  [place].  Kiwi.  Peln.  Esprragos.  Pasas de uva.  [place].  [place]as.  Almejas.  Jugo de naranjas.  Papas.  Sardinas.  Yogur. FSFORO  El fsforo es un mineral que se encuentra en muchos alimentos. Si hay demasiado fsforo en la sangre, ste quita el calcio de los [place]. La prdida de calcio har que sus huesos se debiliten y tengan ms riesgo de romperse. Tambin le causarn picazn en la piel. Alimentos con alto contenido de fsforo [place], [place], frijoles secos, guisantes, las bebidas de cola, nueces y [place] de man. Evite comer demasiado de estos alimentos. Por lo general, las personas en dilisis deben limitar la ingesta  de [place] a  Energy manager. Hable con un nutricionista sobre los alimentos que puede comer en lugar de alimentos ricos en fsforo.  PROTENAS  Le indicarn que consuma tantas "protenas de alta calidad" como pueda. Las protenas de alta calidad provienen de la carne, el pescado, las aves y [place]. Elija carnes desgrasadas(magras), que  tambin contienen poco fsforo. Si es vegetariano, consulte acerca de otros modos de obtener protenas. La [employer] es una buena fuente de protenas, pero es rica en fsforo y Field seismologist. Consulte a un nutricionista para saber si puede incluir [employer] su plan de alimentacin.  SODIO  El sodio produce sed, y si usted est en dilisis, debe restringir la cantidad de lquido que bebe. Por lo tanto, trate de consumir alimentos frescos que sean naturalmente bajos en sodio. Evite los alimentos enlatados y las comidas congeladas. Busque productos etiquetados como "bajos en sodio". No use sustitutos de sal ya que contienen potasio. Hable con su nutriconista [employer] y las mezclas de especias sin sodio o potasio.  CALORAS  Las caloras provienen de los alimentos y proporcionan energa para su cuerpo. El nutricionista podr recomendar que agregue o reduzca las caloras que consume en funcin de si debe o no debe Systems developer. Hable con el nutricionista [employer] puede comer para ganar o Administrator, Civil Service.  VITAMINAS Y MINERALES  El mdico le puede recetar un suplemento de vitaminas y minerales. Si evita algunos alimentos, puede ser que su dieta carezca de vitaminas y minerales. Hable con su dietista o con el nefrlogo antes de elegir un suplemento. Muchos suplementos de minerales o vitaminas que se adquieren en las tiendas pueden ser perjudiciales para usted.  Document Released: 10/29/2011 [facility] Patient Information 2015 [place]. This information is not intended to replace advice given to you by your health care provider. Make sure you discuss any questions you have with your health care provider.

## 2014-07-30 LAB — COMPREHENSIVE METABOLIC PANEL
ALBUMIN: 3.5 g/dL (ref 3.5–5.2)
ALT: 75 U/L — ABNORMAL HIGH (ref 0–53)
AST: 86 U/L — ABNORMAL HIGH (ref 0–37)
Alkaline Phosphatase: 97 U/L (ref 39–117)
BUN: 22 mg/dL (ref 6–23)
CO2: 27 meq/L (ref 19–32)
Calcium: 9.3 mg/dL (ref 8.4–10.5)
Chloride: 97 mEq/L (ref 96–112)
Creat: 5.39 mg/dL — ABNORMAL HIGH (ref 0.50–1.35)
Glucose, Bld: 96 mg/dL (ref 70–99)
Potassium: 4.8 mEq/L (ref 3.5–5.3)
Sodium: 134 mEq/L — ABNORMAL LOW (ref 135–145)
Total Bilirubin: 0.4 mg/dL (ref 0.2–1.2)
Total Protein: 8.6 g/dL — ABNORMAL HIGH (ref 6.0–8.3)

## 2014-08-12 ENCOUNTER — Encounter (HOSPITAL_BASED_OUTPATIENT_CLINIC_OR_DEPARTMENT_OTHER): Payer: No Typology Code available for payment source | Attending: Internal Medicine

## 2014-08-12 DIAGNOSIS — T8781 Dehiscence of amputation stump: Secondary | ICD-10-CM | POA: Insufficient documentation

## 2014-08-12 DIAGNOSIS — Y835 Amputation of limb(s) as the cause of abnormal reaction of the patient, or of later complication, without mention of misadventure at the time of the procedure: Secondary | ICD-10-CM | POA: Insufficient documentation

## 2014-08-12 DIAGNOSIS — S81802D Unspecified open wound, left lower leg, subsequent encounter: Secondary | ICD-10-CM | POA: Insufficient documentation

## 2014-08-12 DIAGNOSIS — Z794 Long term (current) use of insulin: Secondary | ICD-10-CM | POA: Insufficient documentation

## 2014-08-12 DIAGNOSIS — E1151 Type 2 diabetes mellitus with diabetic peripheral angiopathy without gangrene: Secondary | ICD-10-CM | POA: Insufficient documentation

## 2014-08-23 ENCOUNTER — Other Ambulatory Visit: Payer: Self-pay | Admitting: Infectious Diseases

## 2014-08-24 ENCOUNTER — Ambulatory Visit: Payer: Medicaid Other | Admitting: Infectious Diseases

## 2014-08-30 ENCOUNTER — Telehealth: Payer: Self-pay | Admitting: *Deleted

## 2014-08-30 NOTE — Telephone Encounter (Signed)
-----   Message from [name], MD sent at 08/02/2014 10:59 AM EDT ----- Please inform him DM is controlled; stay on current insulin regimen of 5 units bid and keep dialysis appointments. Thanks.

## 2014-08-30 NOTE — Telephone Encounter (Signed)
Pt is aware of his lab results. 

## 2014-09-16 ENCOUNTER — Encounter (HOSPITAL_BASED_OUTPATIENT_CLINIC_OR_DEPARTMENT_OTHER): Payer: No Typology Code available for payment source | Attending: Internal Medicine

## 2014-09-16 DIAGNOSIS — L97921 Non-pressure chronic ulcer of unspecified part of left lower leg limited to breakdown of skin: Secondary | ICD-10-CM | POA: Insufficient documentation

## 2014-09-16 DIAGNOSIS — E1140 Type 2 diabetes mellitus with diabetic neuropathy, unspecified: Secondary | ICD-10-CM | POA: Insufficient documentation

## 2014-09-16 DIAGNOSIS — T8131XD Disruption of external operation (surgical) wound, not elsewhere classified, subsequent encounter: Secondary | ICD-10-CM | POA: Insufficient documentation

## 2014-09-16 DIAGNOSIS — Z89512 Acquired absence of left leg below knee: Secondary | ICD-10-CM | POA: Insufficient documentation

## 2014-10-14 ENCOUNTER — Encounter (HOSPITAL_BASED_OUTPATIENT_CLINIC_OR_DEPARTMENT_OTHER): Payer: No Typology Code available for payment source | Attending: Internal Medicine

## 2014-10-14 DIAGNOSIS — E11622 Type 2 diabetes mellitus with other skin ulcer: Secondary | ICD-10-CM | POA: Insufficient documentation

## 2014-10-14 DIAGNOSIS — Z8739 Personal history of other diseases of the musculoskeletal system and connective tissue: Secondary | ICD-10-CM | POA: Insufficient documentation

## 2014-10-14 DIAGNOSIS — Z89512 Acquired absence of left leg below knee: Secondary | ICD-10-CM | POA: Insufficient documentation

## 2014-10-14 DIAGNOSIS — E1140 Type 2 diabetes mellitus with diabetic neuropathy, unspecified: Secondary | ICD-10-CM | POA: Insufficient documentation

## 2014-10-14 DIAGNOSIS — L97821 Non-pressure chronic ulcer of other part of left lower leg limited to breakdown of skin: Secondary | ICD-10-CM | POA: Insufficient documentation

## 2014-10-17 ENCOUNTER — Other Ambulatory Visit: Payer: Self-pay | Admitting: Infectious Diseases

## 2014-10-17 ENCOUNTER — Other Ambulatory Visit: Payer: Self-pay | Admitting: Family Medicine

## 2014-10-17 DIAGNOSIS — B20 Human immunodeficiency virus [HIV] disease: Secondary | ICD-10-CM

## 2014-10-17 MED ORDER — EMTRICITABINE 200 MG PO CAPS: ORAL_CAPSULE | ORAL | Status: DC

## 2014-10-17 MED ORDER — TENOFOVIR DISOPROXIL FUMARATE 300 MG PO TABS: ORAL_TABLET | ORAL | Status: DC

## 2014-10-17 MED ORDER — SULFAMETHOXAZOLE-TRIMETHOPRIM 400-80 MG PO TABS: 1.0000 | ORAL_TABLET | Freq: Every day | ORAL | Status: DC

## 2014-11-11 ENCOUNTER — Encounter (HOSPITAL_BASED_OUTPATIENT_CLINIC_OR_DEPARTMENT_OTHER): Payer: No Typology Code available for payment source | Attending: Internal Medicine

## 2014-11-11 DIAGNOSIS — Z89512 Acquired absence of left leg below knee: Secondary | ICD-10-CM | POA: Insufficient documentation

## 2014-11-11 DIAGNOSIS — B1920 Unspecified viral hepatitis C without hepatic coma: Secondary | ICD-10-CM | POA: Insufficient documentation

## 2014-11-11 DIAGNOSIS — L97821 Non-pressure chronic ulcer of other part of left lower leg limited to breakdown of skin: Secondary | ICD-10-CM | POA: Insufficient documentation

## 2014-11-11 DIAGNOSIS — N186 End stage renal disease: Secondary | ICD-10-CM | POA: Insufficient documentation

## 2014-11-11 DIAGNOSIS — E0822 Diabetes mellitus due to underlying condition with diabetic chronic kidney disease: Secondary | ICD-10-CM | POA: Insufficient documentation

## 2014-11-11 DIAGNOSIS — I959 Hypotension, unspecified: Secondary | ICD-10-CM | POA: Insufficient documentation

## 2014-11-11 DIAGNOSIS — E11622 Type 2 diabetes mellitus with other skin ulcer: Secondary | ICD-10-CM | POA: Insufficient documentation

## 2014-11-11 DIAGNOSIS — E1140 Type 2 diabetes mellitus with diabetic neuropathy, unspecified: Secondary | ICD-10-CM | POA: Insufficient documentation

## 2014-11-11 DIAGNOSIS — Z992 Dependence on renal dialysis: Secondary | ICD-10-CM | POA: Insufficient documentation

## 2014-11-15 ENCOUNTER — Other Ambulatory Visit: Payer: Self-pay | Admitting: Infectious Diseases

## 2014-11-15 DIAGNOSIS — K219 Gastro-esophageal reflux disease without esophagitis: Secondary | ICD-10-CM

## 2014-11-22 ENCOUNTER — Encounter: Payer: Self-pay | Admitting: Infectious Disease

## 2014-11-22 ENCOUNTER — Ambulatory Visit (INDEPENDENT_AMBULATORY_CARE_PROVIDER_SITE_OTHER): Payer: Self-pay | Admitting: Infectious Disease

## 2014-11-22 VITALS — BP 93/60 | HR 93 | Temp 98.1°F | Wt 104.0 lb

## 2014-11-22 DIAGNOSIS — Z992 Dependence on renal dialysis: Secondary | ICD-10-CM

## 2014-11-22 DIAGNOSIS — E1169 Type 2 diabetes mellitus with other specified complication: Secondary | ICD-10-CM

## 2014-11-22 DIAGNOSIS — B182 Chronic viral hepatitis C: Secondary | ICD-10-CM | POA: Insufficient documentation

## 2014-11-22 DIAGNOSIS — N186 End stage renal disease: Secondary | ICD-10-CM

## 2014-11-22 DIAGNOSIS — M869 Osteomyelitis, unspecified: Secondary | ICD-10-CM

## 2014-11-22 DIAGNOSIS — T8789 Other complications of amputation stump: Secondary | ICD-10-CM

## 2014-11-22 DIAGNOSIS — B20 Human immunodeficiency virus [HIV] disease: Secondary | ICD-10-CM | POA: Insufficient documentation

## 2014-11-22 DIAGNOSIS — E1122 Type 2 diabetes mellitus with diabetic chronic kidney disease: Secondary | ICD-10-CM

## 2014-11-22 DIAGNOSIS — T8740 Infection of amputation stump, unspecified extremity: Secondary | ICD-10-CM

## 2014-11-22 DIAGNOSIS — L89899 Pressure ulcer of other site, unspecified stage: Secondary | ICD-10-CM | POA: Insufficient documentation

## 2014-11-22 DIAGNOSIS — M9080 Osteopathy in diseases classified elsewhere, unspecified site: Secondary | ICD-10-CM

## 2014-11-22 DIAGNOSIS — L89892 Pressure ulcer of other site, stage 2: Secondary | ICD-10-CM

## 2014-11-22 DIAGNOSIS — E1322 Other specified diabetes mellitus with diabetic chronic kidney disease: Secondary | ICD-10-CM

## 2014-11-22 HISTORY — DX: Pressure ulcer of other site, stage 2: L89.892

## 2014-11-22 HISTORY — DX: Human immunodeficiency virus (HIV) disease: B20

## 2014-11-22 HISTORY — DX: Chronic viral hepatitis C: B18.2

## 2014-11-22 MED ORDER — DOXYCYCLINE HYCLATE 100 MG PO CAPS: 100.0000 mg | ORAL_CAPSULE | Freq: Two times a day (BID) | ORAL | Status: DC

## 2014-11-22 NOTE — Patient Instructions (Signed)
followup in one month re your knee wound with Dr. [name]

## 2014-11-22 NOTE — Progress Notes (Signed)
Subjective:    Patient ID: Alvin Daniels, male    DOB: December 08, 1970, 44 y.o.   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  He claims that he now has medicare with rx drug plan  He claims to be highly adherent to his ARV regimen and labs in January bore this out.  Lab Results  Component Value Date   HIV1RNAQUANT <20 06/13/2014   Lab Results  Component Value Date   CD4TABS 140* 06/13/2014   CD4TABS 170* 04/19/2014   CD4TABS 260* 02/23/2014    He has chronic hepatitis C without hepatic coma genotype 1 but has not yet been rx.   He also has developed an ulcer near his BKA site due to dressing that was too tight. But not had frank purulence from this site.   Review of Systems  Constitutional: Negative for fever, chills, diaphoresis, activity change, appetite change, fatigue and unexpected weight change.  HENT: Negative for congestion, rhinorrhea, sinus pressure, sneezing, sore throat and trouble swallowing.   Eyes: Negative for photophobia and visual disturbance.  Respiratory: Negative for cough, chest tightness, shortness of breath, wheezing and stridor.   Cardiovascular: Negative for chest pain, palpitations and leg swelling.  Gastrointestinal: Negative for nausea, vomiting, abdominal pain, diarrhea, constipation, blood in stool, abdominal distention and anal bleeding.  Genitourinary: Negative for dysuria, hematuria, flank pain and difficulty urinating.  Musculoskeletal: Negative for myalgias, back pain, joint swelling, arthralgias and gait problem.  Skin: Positive for wound. Negative for color change, pallor and rash.  Neurological: Negative for dizziness, tremors, weakness and light-headedness.  Hematological: Negative for adenopathy. Does not bruise/bleed easily.  Psychiatric/Behavioral: Negative for behavioral problems, confusion, sleep  disturbance, dysphoric mood, decreased concentration and agitation.       Objective:   Physical Exam  Constitutional: He is oriented to person, place, and time. No distress.  HENT:  Head: Normocephalic.  Mouth/Throat: Oropharynx is clear and moist. No oropharyngeal exudate.  Eyes: Conjunctivae and EOM are normal. No scleral icterus.  Neck: Normal range of motion.  Cardiovascular: Normal rate and regular rhythm.   Pulmonary/Chest: Effort normal. No respiratory distress. He has no wheezes.  Abdominal: Soft. He exhibits no distension. There is no tenderness.  Musculoskeletal: Normal range of motion.  Neurological: He is alert and oriented to person, place, and time.  Skin: Skin is dry. He is not diaphoretic.  Psychiatric: He has a normal mood and affect. His behavior is normal. Thought content normal.    Left BKA site moist with some drainage  11/22/14:          Assessment & Plan:   Wound at  Stump site: --will have him take doxycyline 100mg  bid for next month and followup with [place] again along with Dr Sharol Given, wound care clinic  HIV/AIDS: recheck HIV VL and CD4 and continue current regimen. Could consider prezista/norvir to prezcobix for dosing simplification and perhaps soon DESCOVY will have data in ESRD HD pts  CHronic hepatitis C without coma: --recheck HCV VL and check NS5A resistance --at present correct recommended treatment that would be easiest provided no sig drug interactions would be ZEPATIER, will cc Onnie Boer to consider working on this when he gets back  Insulin dependent dM with ESRD: He requested that I refill his insulin but I would prefer if his PCP would do this  I spent greater than 25 minutes with the  patient including greater than 50% of time in face to face counsel of the patient re his HIV, chronic hep C , BKA stump wound  and in coordination of their care.

## 2014-11-24 LAB — T-HELPER CELL (CD4) - (RCID CLINIC ONLY)
CD4 % Helper T Cell: 16 % — ABNORMAL LOW (ref 33–55)
CD4 T CELL ABS: 200 /uL — AB (ref 400–2700)

## 2014-11-24 LAB — HIV-1 RNA QUANT-NO REFLEX-BLD: HIV-1 RNA Quant, Log: <1.3 {Log} (ref <1.30)

## 2014-11-24 LAB — HEPATITIS C RNA QUANTITATIVE
HCV QUANT LOG: 6.64 {Log} — AB (ref <1.18)
HCV Quantitative: 4362012 IU/mL — ABNORMAL HIGH (ref <15)

## 2014-11-28 LAB — HCV RNA NS5A DRUG RESISTANCE

## 2014-12-09 IMAGING — CT CT HEAD W/O CM
2 series · 16 of 30 positions shown, 20 images · non-contrast
Comparison: Brain CT April 15, 2013 and brain MRI April 15, 2013

CLINICAL DATA: Loss of consciousness

EXAM:
CT HEAD WITHOUT CONTRAST
TECHNIQUE: Contiguous axial images were obtained from the base of the skull
through the vertex without intravenous contrast.

[Series 201: head w/o, idose (1) · axial · non-contrast · 0.39mm/px · z∈[+65,+175]mm · 13 of 26 slices shown, 17 images]
[im 2/26  brain]
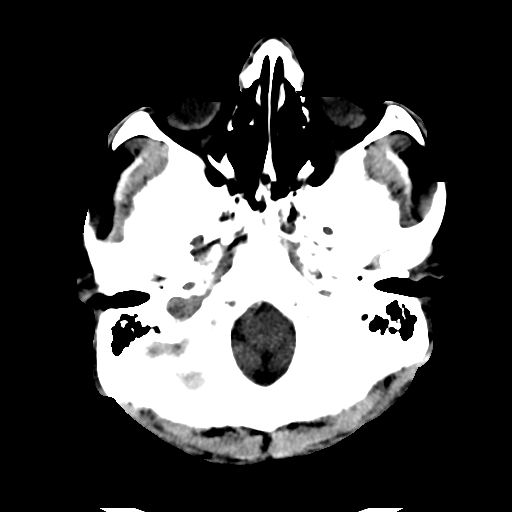
[im 2/26  bone]
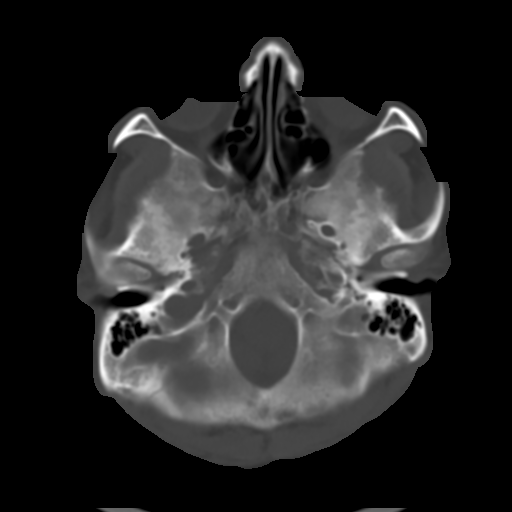
[im 4/26  brain]
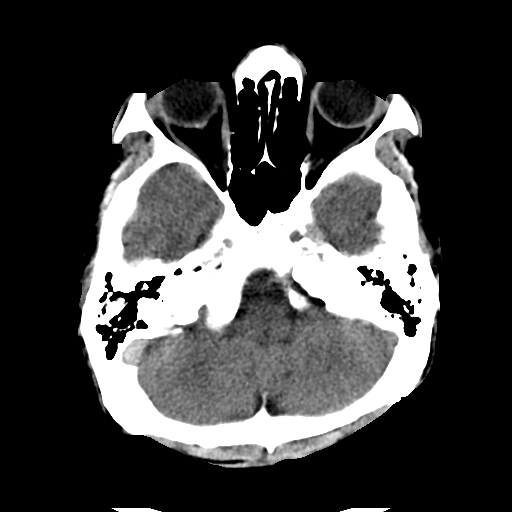
[im 6/26  brain]
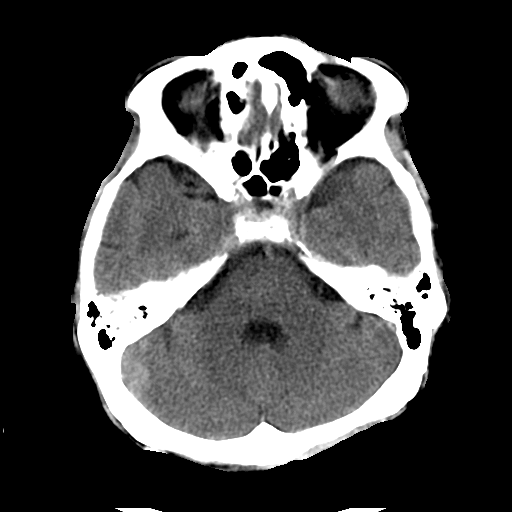
[im 8/26  brain]
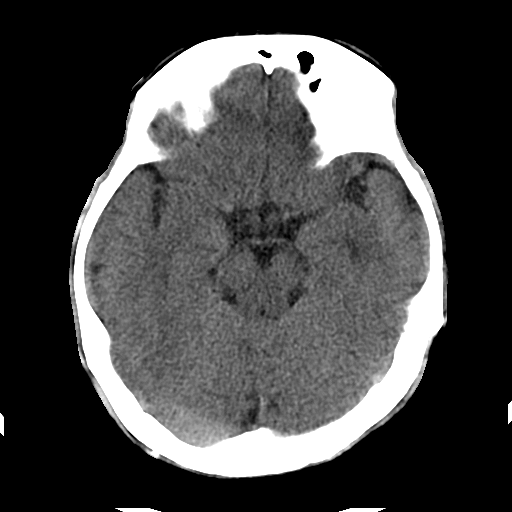
[im 9/26  brain]
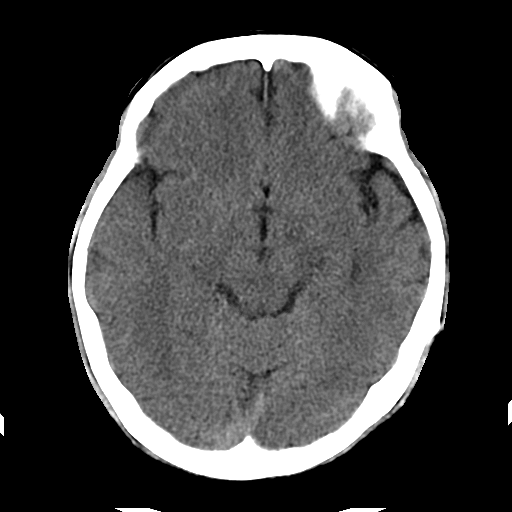
[im 9/26  bone]
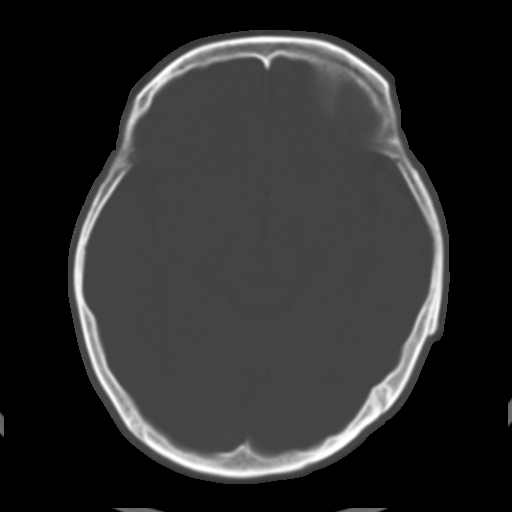
[im 11/26  brain]
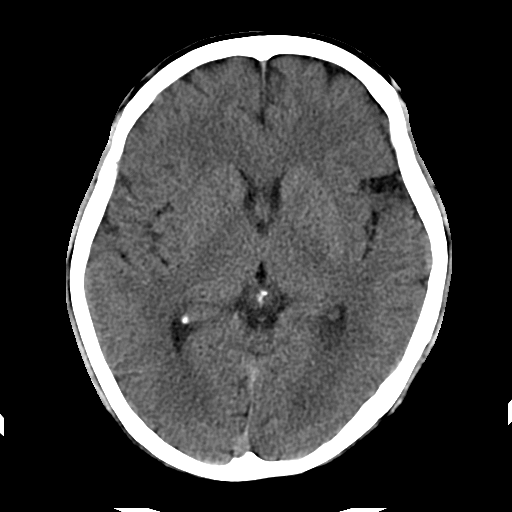
[im 13/26  brain]
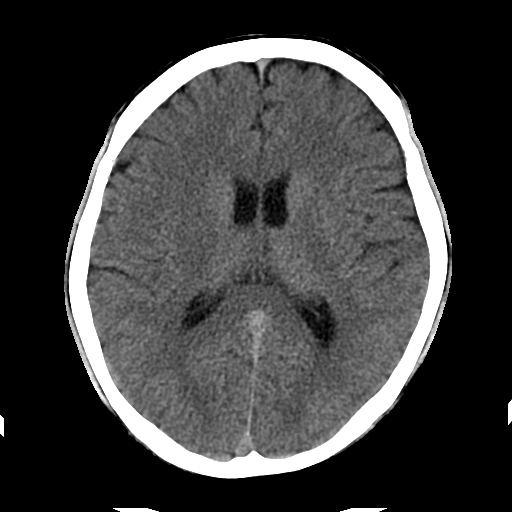
[im 15/26  brain]
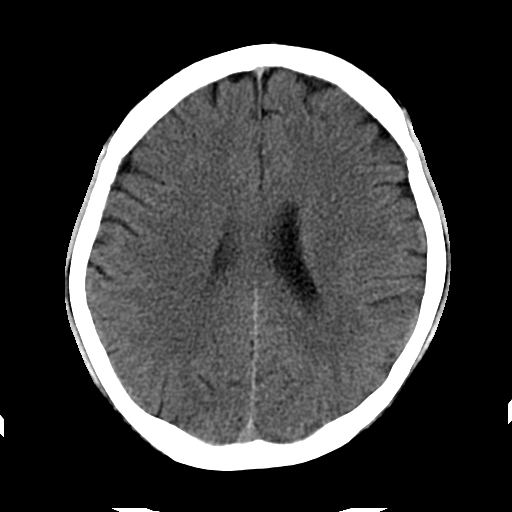
[im 17/26  brain]
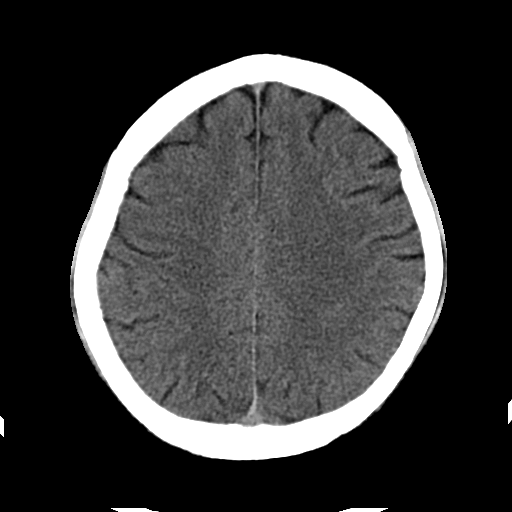
[im 17/26  bone]
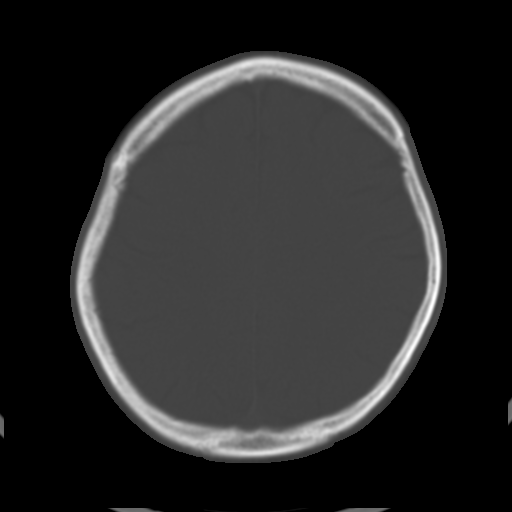
[im 18/26  brain]
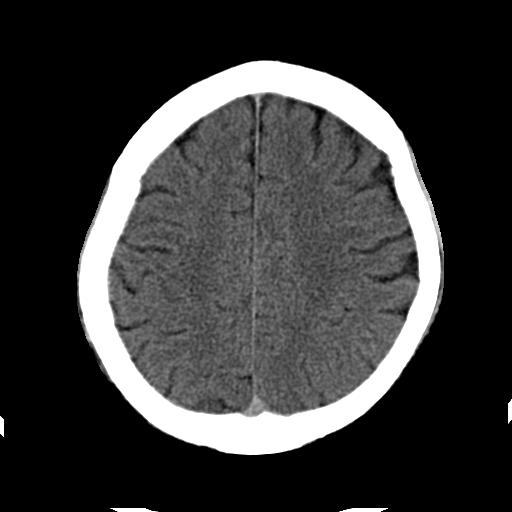
[im 20/26  brain]
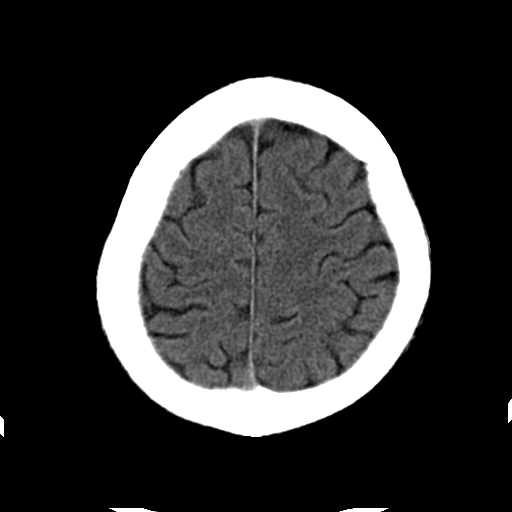
[im 22/26  brain]
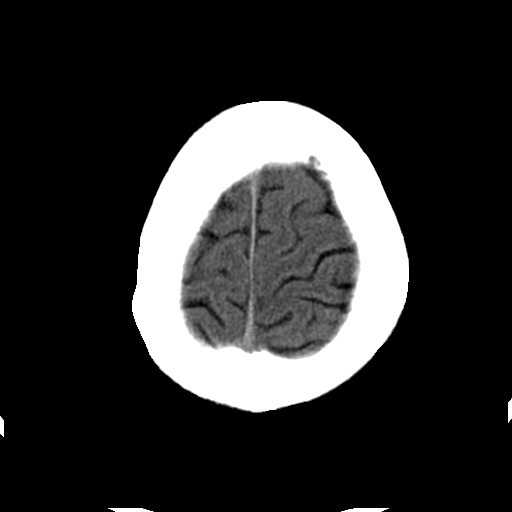
[im 24/26  brain]
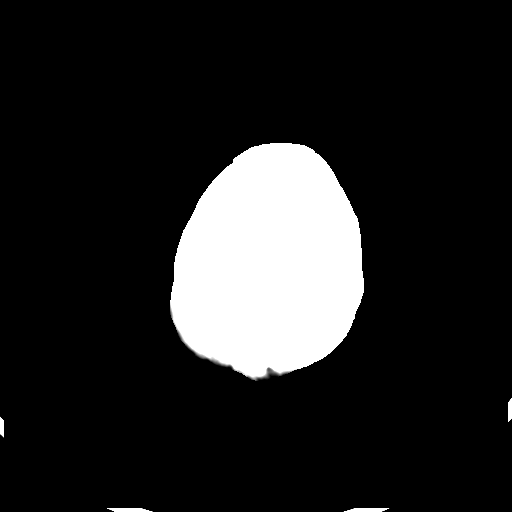
[im 24/26  bone]
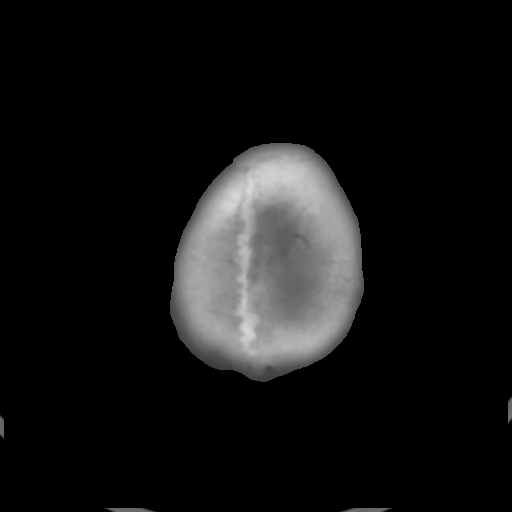

[Series 202: head w/o bone, idose (1) · axial · non-contrast · 0.39mm/px · z∈[+65,+100]mm · 3 of 26 slices shown]
[im 2/26  bone]
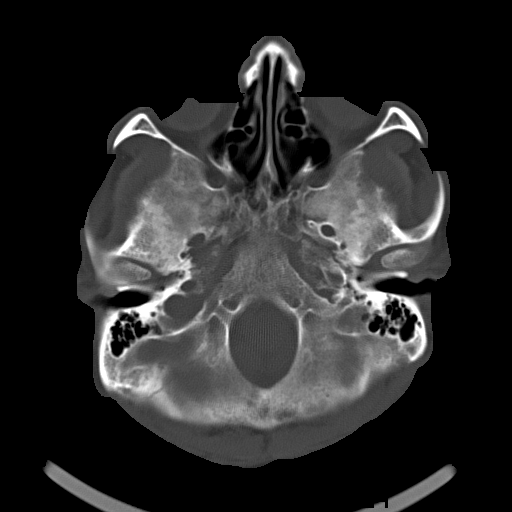
[im 6/26  bone]
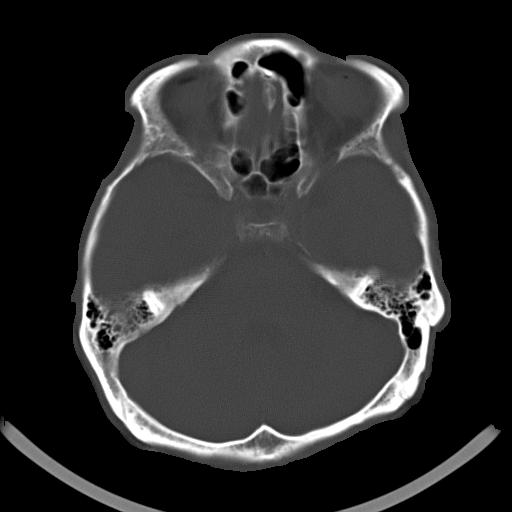
[im 9/26  bone]
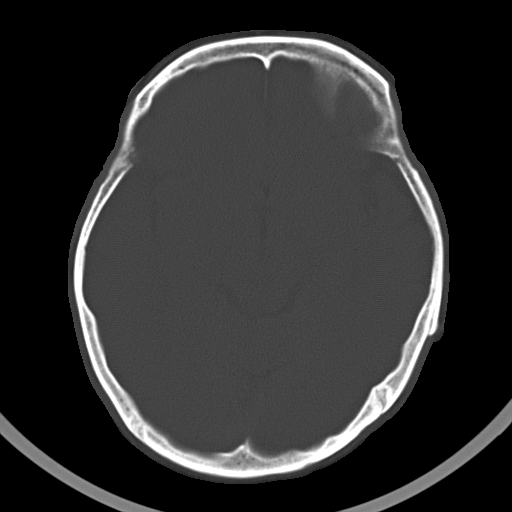

[16 of 30 positions shown; findings below may reference images not displayed]

FINDINGS: The ventricles are normal in size and configuration. There is no
appreciable mass, hemorrhage, extra-axial fluid collection or
midline shift. Gray-white compartments are normal. There is no
demonstrable acute infarct. Bony calvarium appears intact. The
mastoid air cells are clear.
IMPRESSION: Study within normal limits. No demonstrable mass, hemorrhage, or
acute appearing infarct.

## 2014-12-10 ENCOUNTER — Encounter (HOSPITAL_COMMUNITY): Payer: Self-pay | Admitting: Emergency Medicine

## 2014-12-10 ENCOUNTER — Emergency Department (HOSPITAL_COMMUNITY)
Admission: EM | Admit: 2014-12-10 | Discharge: 2014-12-10 | Disposition: A | Discharge status: Home / Self Care | Payer: Medicaid Other | Attending: Physician Assistant | Admitting: Physician Assistant

## 2014-12-10 DIAGNOSIS — B20 Human immunodeficiency virus [HIV] disease: Secondary | ICD-10-CM | POA: Diagnosis not present

## 2014-12-10 DIAGNOSIS — Z8614 Personal history of Methicillin resistant Staphylococcus aureus infection: Secondary | ICD-10-CM | POA: Insufficient documentation

## 2014-12-10 DIAGNOSIS — I959 Hypotension, unspecified: Secondary | ICD-10-CM | POA: Diagnosis present

## 2014-12-10 DIAGNOSIS — Z8719 Personal history of other diseases of the digestive system: Secondary | ICD-10-CM | POA: Insufficient documentation

## 2014-12-10 DIAGNOSIS — Z872 Personal history of diseases of the skin and subcutaneous tissue: Secondary | ICD-10-CM | POA: Insufficient documentation

## 2014-12-10 DIAGNOSIS — Z862 Personal history of diseases of the blood and blood-forming organs and certain disorders involving the immune mechanism: Secondary | ICD-10-CM | POA: Diagnosis not present

## 2014-12-10 DIAGNOSIS — Z8739 Personal history of other diseases of the musculoskeletal system and connective tissue: Secondary | ICD-10-CM | POA: Diagnosis not present

## 2014-12-10 DIAGNOSIS — Z992 Dependence on renal dialysis: Secondary | ICD-10-CM | POA: Diagnosis not present

## 2014-12-10 DIAGNOSIS — Z794 Long term (current) use of insulin: Secondary | ICD-10-CM | POA: Diagnosis not present

## 2014-12-10 DIAGNOSIS — E119 Type 2 diabetes mellitus without complications: Secondary | ICD-10-CM | POA: Diagnosis not present

## 2014-12-10 DIAGNOSIS — N186 End stage renal disease: Secondary | ICD-10-CM | POA: Insufficient documentation

## 2014-12-10 DIAGNOSIS — Z79899 Other long term (current) drug therapy: Secondary | ICD-10-CM | POA: Insufficient documentation

## 2014-12-10 DIAGNOSIS — Z4889 Encounter for other specified surgical aftercare: Secondary | ICD-10-CM | POA: Diagnosis not present

## 2014-12-10 DIAGNOSIS — Z5189 Encounter for other specified aftercare: Secondary | ICD-10-CM

## 2014-12-10 LAB — BASIC METABOLIC PANEL
Anion gap: 9 (ref 5–15)
BUN: 8 mg/dL (ref 6–20)
CHLORIDE: 98 mmol/L — AB (ref 101–111)
CO2: 30 mmol/L (ref 22–32)
CREATININE: 3.78 mg/dL — AB (ref 0.61–1.24)
Calcium: 8.4 mg/dL — ABNORMAL LOW (ref 8.9–10.3)
GFR calc non Af Amer: 18 mL/min — ABNORMAL LOW (ref >60)
GFR, EST AFRICAN AMERICAN: 21 mL/min — AB (ref >60)
Glucose, Bld: 243 mg/dL — ABNORMAL HIGH (ref 65–99)
Potassium: 3.5 mmol/L (ref 3.5–5.1)
Sodium: 137 mmol/L (ref 135–145)

## 2014-12-10 LAB — CBC WITH DIFFERENTIAL/PLATELET
Basophils Absolute: 0.1 10*3/uL (ref 0.0–0.1)
Basophils Relative: 1 % (ref 0–1)
Eosinophils Absolute: 0.4 10*3/uL (ref 0.0–0.7)
Eosinophils Relative: 7 % — ABNORMAL HIGH (ref 0–5)
HEMATOCRIT: 31.1 % — AB (ref 39.0–52.0)
HEMOGLOBIN: 10.4 g/dL — AB (ref 13.0–17.0)
LYMPHS ABS: 1.3 10*3/uL (ref 0.7–4.0)
Lymphocytes Relative: 23 % (ref 12–46)
MCH: 33.5 pg (ref 26.0–34.0)
MCHC: 33.4 g/dL (ref 30.0–36.0)
MCV: 100.3 fL — ABNORMAL HIGH (ref 78.0–100.0)
MONOS PCT: 9 % (ref 3–12)
Monocytes Absolute: 0.5 10*3/uL (ref 0.1–1.0)
NEUTROS ABS: 3.6 10*3/uL (ref 1.7–7.7)
Neutrophils Relative %: 60 % (ref 43–77)
Platelets: 173 10*3/uL (ref 150–400)
RBC: 3.1 MIL/uL — AB (ref 4.22–5.81)
RDW: 13.4 % (ref 11.5–15.5)
WBC: 5.9 10*3/uL (ref 4.0–10.5)

## 2014-12-10 MED ORDER — OXYCODONE-ACETAMINOPHEN 5-325 MG PO TABS
1.0000 | ORAL_TABLET | Freq: Once | ORAL | Status: AC
  Administered 2014-12-10: 1 via ORAL
  Filled 2014-12-10: qty 1

## 2014-12-10 MED ORDER — OXYCODONE-ACETAMINOPHEN 5-325 MG PO TABS: 1.0000 | ORAL_TABLET | Freq: Four times a day (QID) | ORAL | Status: DC | PRN

## 2014-12-10 NOTE — Discharge Instructions (Signed)
Llamar tu Doctor de piernes a Lunes.  Regressar si aye un feviere.

## 2014-12-10 NOTE — ED Notes (Signed)
Patient was educated not to drive, operate heavy machinery, or drink alcohol while taking narcotic medication.  

## 2014-12-10 NOTE — ED Notes (Addendum)
Pt c/o abdominal pain last night had dialysis today at 0800, unsure of how many liters were removed, hypotensive. Denies fever, nausea, emesis. C/o pain around right leg wound above BKA, onset after using a new medicated cream.

## 2014-12-10 NOTE — ED Provider Notes (Signed)
CSN: [id number]     Arrival date & time 12/10/14  1316 History   First MD Initiated Contact with Patient 12/10/14 1332     Chief Complaint  Patient presents with  . 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 Patient went to appointment yesterday to look at his wound. Patient states they started a new medication. This medication as been hurting his wound. He has a little bit of pain to left of his chronic wound. The wound itself appears normal. Unchanged from yesterday. He's had no fever. He received dialysis this morning at 7 AM. He is unsure how many  liters they took off. However he feels his normal state currently. He's coming in just for the slight increase in pain on his stump..   Past Medical History  Diagnosis Date  . Hepatitis C   . MRSA infection   . Pancreatitis   . Chronic diarrhea   . HIV INFECTION 06/27/2010    Qualifier: Diagnosis of  By: Nickola Major CMA ( [place]), Geni Bers    . Hypotension 06/02/2012  . Metabolic bone disease [id number]  . Normocytic anemia 06/17/2012  . Severe protein-calorie malnutrition 06/17/2012  . Diabetic neuropathy   . ESRD (end stage renal disease) on dialysis     "TTS; don't remember street name" (05/03/2014)  . Renal disorder   . Uncontrolled diabetes mellitus with complications 0000000    Annotation: uncontrolled Qualifier: Diagnosis of  By: Nickola Major CMA ( [place]), Geni Bers    . Pressure ulcer of BKA stump, stage 2 11/22/2014  . Chronic hepatitis C without hepatic coma 11/22/2014  . AIDS 11/22/2014   Past Surgical History  Procedure Laterality Date  . Av fistula placement Left   . Amputation Left 04/20/2014    Procedure: 3rd toe amputation, 4th Toe Amputation,  5th Toe Amputation;  Surgeon: Newt Minion, MD;  Location: [facility];   Service: Orthopedics;  Laterality: Left;  . 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 Amputation Left 05/02/2014    Procedure: Midfoot Amputation;  Surgeon: Newt Minion, MD;  Location: [facility];  Service: Orthopedics;  Laterality: Left;  . Amputation Left 06/17/2014    Procedure: AMPUTATION BELOW KNEE;  Surgeon: Newt Minion, MD;  Location: [facility];  Service: Orthopedics;  Laterality: Left;   Family History  Problem Relation Age of Onset  . Diabetes Mother   . Diabetes Father    History  Substance Use Topics  . Smoking status: Never Smoker   . Smokeless tobacco: Never Used  . Alcohol Use: No    Review of Systems  Constitutional: Negative for fever and activity change.  HENT: Negative for drooling and hearing loss.   Eyes: Negative for discharge and redness.  Respiratory: Negative for cough and shortness of breath.   Cardiovascular: Negative for chest pain.  Gastrointestinal: Negative for abdominal pain.  Musculoskeletal: Negative for arthralgias.  Skin: Positive for wound.  Allergic/Immunologic: Positive for immunocompromised state.  Neurological: Negative for seizures and speech difficulty.  Psychiatric/Behavioral: Negative for behavioral problems and agitation.  All other systems reviewed and are negative.     Allergies  Review of patient's allergies indicates no known allergies.  Home Medications   Prior to Admission medications   Medication Sig Start Date End Date Taking? Authorizing Provider  docusate sodium 100 MG CAPS Take 100  mg by mouth 2 (two) times daily. 04/22/14   Vivi Barrack, MD  doxycycline (VIBRAMYCIN) 100 MG capsule Take 1 capsule (100 mg total) by mouth 2 (two) times daily. 11/22/14   Truman Hayward, MD  emtricitabine (EMTRIVA) 200 MG capsule TAKE 1 CAPSULE BY MOUTH EVERY TUESDAY, AND SATURDAY FOLLOWING DIALYSIS 10/17/14   Campbell Riches, MD  insulin lispro protamine-lispro (HUMALOG MIX 75/25) (75-25) 100 UNIT/ML SUSP injection  Inject 5 Units into the skin 2 (two) times daily with a meal. 15 units in the am and 15 units in the pm 07/29/14   [name], MD  loperamide (IMODIUM A-D) 2 MG tablet Take 1 tablet (2 mg total) by mouth 4 (four) times daily as needed for diarrhea or loose stools. 02/23/14   Campbell Riches, MD  methocarbamol (ROBAXIN) 500 MG tablet Take 1 tablet (500 mg total) by mouth every 6 (six) hours as needed for muscle spasms. Patient not taking: Reported on 07/29/2014 04/22/14   Vivi Barrack, MD  methocarbamol (ROBAXIN) 500 MG tablet Take 1 tablet (500 mg total) by mouth every 6 (six) hours as needed for muscle spasms. 06/20/14   Newt Minion, MD  midodrine (PROAMATINE) 10 MG tablet Take 1 tablet (10 mg total) by mouth 3 (three) times daily with meals. 04/22/14   Vivi Barrack, MD  naproxen (NAPROSYN) 250 MG tablet Take 1 tablet (250 mg total) by mouth 2 (two) times daily as needed. 07/29/14   [name], MD  NORVIR 100 MG TABS tablet TAKE 1 TABLET BY MOUTH EVERY DAY WITH BREAKFAST*MUST TAKE WITH PREZISTA** 10/17/14   Campbell Riches, MD  omeprazole (PRILOSEC) 40 MG capsule TAKE 1 CAPSULE BY MOUTH EVERY DAY 11/15/14   Campbell Riches, MD  ondansetron (ZOFRAN) 4 MG tablet Take 1 tablet (4 mg total) by mouth every 6 (six) hours as needed for nausea. Patient not taking: Reported on 05/02/2014 04/22/14   Vivi Barrack, MD  oxyCODONE-acetaminophen (PERCOCET/ROXICET) 5-325 MG per tablet Take 1 tablet by mouth every 4 (four) hours as needed for moderate pain or severe pain. Patient not taking: Reported on 07/29/2014 04/24/14   Leone Haven, MD  oxyCODONE-acetaminophen (PERCOCET/ROXICET) 5-325 MG per tablet Take 1 tablet by mouth every 4 (four) hours as needed for moderate pain or severe pain. Patient not taking: Reported on 07/29/2014 06/20/14   Newt Minion, MD  oxyCODONE-acetaminophen (ROXICET) 5-325 MG per tablet Take 1 tablet by mouth every 4 (four) hours as needed for severe pain. Patient not taking:  Reported on 07/29/2014 05/03/14   Newt Minion, MD  polyethylene glycol [facility] / Floria Raveling) packet Take 17 g by mouth daily as needed for moderate constipation or severe constipation. Patient not taking: Reported on 05/02/2014 04/22/14   Vivi Barrack, MD  PREZISTA 800 MG tablet TAKE 1 TABLET BY MOUTH EVERY DAY WITH BREAKFAST 10/17/14   Campbell Riches, MD  sulfamethoxazole-trimethoprim (BACTRIM,SEPTRA) 400-80 MG per tablet Take 1 tablet by mouth daily. 10/17/14   Campbell Riches, MD  tenofovir (VIREAD) 300 MG tablet TAKE 1 TABLET BY MOUTH EVERY WEEK ON SUNDAYS 10/17/14   Campbell Riches, MD   BP 82/54 mmHg  Pulse 96  Temp(Src) 98.1 F (36.7 C) (Oral)  Resp 16  SpO2 98% Physical Exam  Constitutional: He is oriented to person, place, and time. He appears well-nourished.  Chronically ill-appearing  HENT:  Head: Normocephalic.  Mouth/Throat: Oropharynx is clear and moist.  Eyes: Conjunctivae are  normal.  Neck: No tracheal deviation present.  Cardiovascular: Normal rate.   Pulmonary/Chest: Effort normal. No stridor. No respiratory distress.  Musculoskeletal: Normal range of motion. He exhibits no edema.  BKA on the left with chronic wound. No frank purulence. Mild erythema but no signs of active infection.  Positive thrill  Neurological: He is oriented to person, place, and time. No cranial nerve deficit.  Skin: Skin is warm and dry. No rash noted. He is not diaphoretic.  Psychiatric: He has a normal mood and affect. His behavior is normal.  Nursing note and vitals reviewed.   ED Course  Procedures (including critical care time) Labs Review Labs Reviewed  BASIC METABOLIC PANEL  CBC WITH DIFFERENTIAL/PLATELET    Imaging Review No results found.   EKG Interpretation None      MDM   Final diagnoses:  None    Patient is a chronically ill 44 year old male with past history significant for HIV hep C diabetes left BKA end-stage renal disease on dialysis. He presents  today with a little bit of increased pain in his left stump. He had his wound checked yesterday. Patient has a chronic wound on the left stump no draining infection no abscess, no fevers. He reports he was seen yesterday for a wound check and they applied a new topical medication that he thinks might be hurting him. I can't find the note today from the clinic yesterday. The stump appears normal, no increased infection. No increase in swelling.  Given normal appearance of the stump if labs are within normal reason. We'll have patient follow up with his primary care provider and call the wound clinic on Monday. In the meantime we'll give him medications to help with the the pain.   Aren Pryde Julio Alm, MD 12/10/14 [phone]

## 2014-12-12 ENCOUNTER — Encounter (HOSPITAL_BASED_OUTPATIENT_CLINIC_OR_DEPARTMENT_OTHER): Payer: Medicaid Other | Attending: Internal Medicine

## 2014-12-12 DIAGNOSIS — E11622 Type 2 diabetes mellitus with other skin ulcer: Secondary | ICD-10-CM | POA: Insufficient documentation

## 2014-12-12 DIAGNOSIS — N186 End stage renal disease: Secondary | ICD-10-CM | POA: Insufficient documentation

## 2014-12-12 DIAGNOSIS — B1920 Unspecified viral hepatitis C without hepatic coma: Secondary | ICD-10-CM | POA: Insufficient documentation

## 2014-12-12 DIAGNOSIS — L97823 Non-pressure chronic ulcer of other part of left lower leg with necrosis of muscle: Secondary | ICD-10-CM | POA: Insufficient documentation

## 2014-12-12 DIAGNOSIS — Z89512 Acquired absence of left leg below knee: Secondary | ICD-10-CM | POA: Insufficient documentation

## 2014-12-12 DIAGNOSIS — B20 Human immunodeficiency virus [HIV] disease: Secondary | ICD-10-CM | POA: Insufficient documentation

## 2014-12-12 DIAGNOSIS — E1122 Type 2 diabetes mellitus with diabetic chronic kidney disease: Secondary | ICD-10-CM | POA: Insufficient documentation

## 2014-12-12 DIAGNOSIS — I959 Hypotension, unspecified: Secondary | ICD-10-CM | POA: Insufficient documentation

## 2014-12-12 DIAGNOSIS — E1140 Type 2 diabetes mellitus with diabetic neuropathy, unspecified: Secondary | ICD-10-CM | POA: Insufficient documentation

## 2014-12-12 DIAGNOSIS — Z992 Dependence on renal dialysis: Secondary | ICD-10-CM | POA: Insufficient documentation

## 2014-12-23 LAB — GLUCOSE, CAPILLARY: GLUCOSE-CAPILLARY: 214 mg/dL — AB (ref 65–99)

## 2014-12-29 ENCOUNTER — Ambulatory Visit: Payer: Self-pay | Admitting: Infectious Disease

## 2014-12-30 ENCOUNTER — Ambulatory Visit (HOSPITAL_COMMUNITY)
Admission: RE | Admit: 2014-12-30 | Discharge: 2014-12-30 | Disposition: A | Discharge status: Home / Self Care | Payer: Medicaid Other | Source: Ambulatory Visit | Attending: Internal Medicine | Admitting: Internal Medicine

## 2014-12-30 ENCOUNTER — Other Ambulatory Visit: Payer: Self-pay | Admitting: Internal Medicine

## 2014-12-30 DIAGNOSIS — M869 Osteomyelitis, unspecified: Secondary | ICD-10-CM | POA: Insufficient documentation

## 2014-12-30 DIAGNOSIS — Z89512 Acquired absence of left leg below knee: Secondary | ICD-10-CM | POA: Insufficient documentation

## 2015-01-13 ENCOUNTER — Encounter (HOSPITAL_BASED_OUTPATIENT_CLINIC_OR_DEPARTMENT_OTHER): Payer: No Typology Code available for payment source | Attending: Internal Medicine

## 2015-01-13 DIAGNOSIS — B20 Human immunodeficiency virus [HIV] disease: Secondary | ICD-10-CM | POA: Insufficient documentation

## 2015-01-13 DIAGNOSIS — E1151 Type 2 diabetes mellitus with diabetic peripheral angiopathy without gangrene: Secondary | ICD-10-CM | POA: Insufficient documentation

## 2015-01-13 DIAGNOSIS — L97221 Non-pressure chronic ulcer of left calf limited to breakdown of skin: Secondary | ICD-10-CM | POA: Insufficient documentation

## 2015-01-13 DIAGNOSIS — I959 Hypotension, unspecified: Secondary | ICD-10-CM | POA: Insufficient documentation

## 2015-01-13 DIAGNOSIS — Z992 Dependence on renal dialysis: Secondary | ICD-10-CM | POA: Insufficient documentation

## 2015-01-13 DIAGNOSIS — X58XXXD Exposure to other specified factors, subsequent encounter: Secondary | ICD-10-CM | POA: Insufficient documentation

## 2015-01-13 DIAGNOSIS — E1140 Type 2 diabetes mellitus with diabetic neuropathy, unspecified: Secondary | ICD-10-CM | POA: Insufficient documentation

## 2015-01-13 DIAGNOSIS — N186 End stage renal disease: Secondary | ICD-10-CM | POA: Insufficient documentation

## 2015-01-13 DIAGNOSIS — T8131XD Disruption of external operation (surgical) wound, not elsewhere classified, subsequent encounter: Secondary | ICD-10-CM | POA: Insufficient documentation

## 2015-01-13 DIAGNOSIS — B1921 Unspecified viral hepatitis C with hepatic coma: Secondary | ICD-10-CM | POA: Insufficient documentation

## 2015-01-18 ENCOUNTER — Ambulatory Visit: Payer: Self-pay | Admitting: Infectious Disease

## 2015-01-25 ENCOUNTER — Ambulatory Visit: Payer: Self-pay | Admitting: Infectious Disease

## 2015-01-27 ENCOUNTER — Other Ambulatory Visit: Payer: Self-pay | Admitting: Internal Medicine

## 2015-01-27 ENCOUNTER — Ambulatory Visit (HOSPITAL_COMMUNITY)
Admission: RE | Admit: 2015-01-27 | Discharge: 2015-01-27 | Disposition: A | Discharge status: Home / Self Care | Payer: Self-pay | Source: Ambulatory Visit | Attending: Internal Medicine | Admitting: Internal Medicine

## 2015-01-27 DIAGNOSIS — M869 Osteomyelitis, unspecified: Secondary | ICD-10-CM

## 2015-02-01 ENCOUNTER — Ambulatory Visit: Payer: Self-pay | Admitting: Infectious Disease

## 2015-02-15 ENCOUNTER — Telehealth: Payer: Self-pay | Admitting: Internal Medicine

## 2015-02-15 NOTE — Telephone Encounter (Signed)
Patient is requesting a letter from the doctor so that he can bring his father to visit him due to his health conditions. You can address the letter to the patient if this is possible and he will take this letter to the appropriate person. Please follow up with patient for clarification on this letter and if he would need to schedule an appointment with the doctor. Thank you. Patient saw dr Jarold Song last.

## 2015-02-17 ENCOUNTER — Encounter (HOSPITAL_COMMUNITY): Payer: Self-pay | Admitting: Emergency Medicine

## 2015-02-17 ENCOUNTER — Emergency Department (HOSPITAL_COMMUNITY): Payer: No Typology Code available for payment source

## 2015-02-17 ENCOUNTER — Encounter (HOSPITAL_BASED_OUTPATIENT_CLINIC_OR_DEPARTMENT_OTHER): Payer: No Typology Code available for payment source | Attending: Internal Medicine

## 2015-02-17 ENCOUNTER — Emergency Department (HOSPITAL_COMMUNITY)
Admission: EM | Admit: 2015-02-17 | Discharge: 2015-02-17 | Disposition: A | Discharge status: Home / Self Care | Payer: Self-pay | Attending: Emergency Medicine | Admitting: Emergency Medicine

## 2015-02-17 DIAGNOSIS — Z8719 Personal history of other diseases of the digestive system: Secondary | ICD-10-CM | POA: Insufficient documentation

## 2015-02-17 DIAGNOSIS — Z992 Dependence on renal dialysis: Secondary | ICD-10-CM | POA: Insufficient documentation

## 2015-02-17 DIAGNOSIS — Z791 Long term (current) use of non-steroidal anti-inflammatories (NSAID): Secondary | ICD-10-CM | POA: Insufficient documentation

## 2015-02-17 DIAGNOSIS — L97822 Non-pressure chronic ulcer of other part of left lower leg with fat layer exposed: Secondary | ICD-10-CM | POA: Insufficient documentation

## 2015-02-17 DIAGNOSIS — E1122 Type 2 diabetes mellitus with diabetic chronic kidney disease: Secondary | ICD-10-CM | POA: Insufficient documentation

## 2015-02-17 DIAGNOSIS — E1140 Type 2 diabetes mellitus with diabetic neuropathy, unspecified: Secondary | ICD-10-CM | POA: Insufficient documentation

## 2015-02-17 DIAGNOSIS — N186 End stage renal disease: Secondary | ICD-10-CM | POA: Insufficient documentation

## 2015-02-17 DIAGNOSIS — Z862 Personal history of diseases of the blood and blood-forming organs and certain disorders involving the immune mechanism: Secondary | ICD-10-CM | POA: Insufficient documentation

## 2015-02-17 DIAGNOSIS — M542 Cervicalgia: Secondary | ICD-10-CM | POA: Insufficient documentation

## 2015-02-17 DIAGNOSIS — I959 Hypotension, unspecified: Secondary | ICD-10-CM | POA: Insufficient documentation

## 2015-02-17 DIAGNOSIS — Z89519 Acquired absence of unspecified leg below knee: Secondary | ICD-10-CM | POA: Insufficient documentation

## 2015-02-17 DIAGNOSIS — Z79899 Other long term (current) drug therapy: Secondary | ICD-10-CM | POA: Insufficient documentation

## 2015-02-17 DIAGNOSIS — E11622 Type 2 diabetes mellitus with other skin ulcer: Secondary | ICD-10-CM | POA: Insufficient documentation

## 2015-02-17 DIAGNOSIS — Z8614 Personal history of Methicillin resistant Staphylococcus aureus infection: Secondary | ICD-10-CM | POA: Insufficient documentation

## 2015-02-17 DIAGNOSIS — B1920 Unspecified viral hepatitis C without hepatic coma: Secondary | ICD-10-CM | POA: Insufficient documentation

## 2015-02-17 DIAGNOSIS — R6883 Chills (without fever): Secondary | ICD-10-CM | POA: Insufficient documentation

## 2015-02-17 DIAGNOSIS — Z8619 Personal history of other infectious and parasitic diseases: Secondary | ICD-10-CM | POA: Insufficient documentation

## 2015-02-17 DIAGNOSIS — B20 Human immunodeficiency virus [HIV] disease: Secondary | ICD-10-CM | POA: Insufficient documentation

## 2015-02-17 DIAGNOSIS — R51 Headache: Secondary | ICD-10-CM | POA: Insufficient documentation

## 2015-02-17 DIAGNOSIS — Z794 Long term (current) use of insulin: Secondary | ICD-10-CM | POA: Insufficient documentation

## 2015-02-17 LAB — COMPREHENSIVE METABOLIC PANEL
ALBUMIN: 3.6 g/dL (ref 3.5–5.0)
ALT: 76 U/L — ABNORMAL HIGH (ref 17–63)
AST: 73 U/L — AB (ref 15–41)
Alkaline Phosphatase: 120 U/L (ref 38–126)
Anion gap: 9 (ref 5–15)
BUN: 27 mg/dL — AB (ref 6–20)
CHLORIDE: 102 mmol/L (ref 101–111)
CO2: 22 mmol/L (ref 22–32)
Calcium: 9.1 mg/dL (ref 8.9–10.3)
Creatinine, Ser: 6.58 mg/dL — ABNORMAL HIGH (ref 0.61–1.24)
GFR calc Af Amer: 11 mL/min — ABNORMAL LOW (ref >60)
GFR calc non Af Amer: 9 mL/min — ABNORMAL LOW (ref >60)
GLUCOSE: 303 mg/dL — AB (ref 65–99)
POTASSIUM: 4.9 mmol/L (ref 3.5–5.1)
Sodium: 133 mmol/L — ABNORMAL LOW (ref 135–145)
Total Bilirubin: 0.7 mg/dL (ref 0.3–1.2)
Total Protein: 8.7 g/dL — ABNORMAL HIGH (ref 6.5–8.1)

## 2015-02-17 LAB — CBC WITH DIFFERENTIAL/PLATELET
BASOS ABS: 0.1 10*3/uL (ref 0.0–0.1)
BASOS PCT: 1 %
EOS ABS: 0.5 10*3/uL (ref 0.0–0.7)
EOS PCT: 8 %
HCT: 33.8 % — ABNORMAL LOW (ref 39.0–52.0)
Hemoglobin: 11 g/dL — ABNORMAL LOW (ref 13.0–17.0)
Lymphocytes Relative: 33 %
Lymphs Abs: 2.1 10*3/uL (ref 0.7–4.0)
MCH: 33 pg (ref 26.0–34.0)
MCHC: 32.5 g/dL (ref 30.0–36.0)
MCV: 101.5 fL — ABNORMAL HIGH (ref 78.0–100.0)
MONO ABS: 0.5 10*3/uL (ref 0.1–1.0)
Monocytes Relative: 8 %
Neutro Abs: 3.2 10*3/uL (ref 1.7–7.7)
Neutrophils Relative %: 50 %
PLATELETS: 200 10*3/uL (ref 150–400)
RBC: 3.33 MIL/uL — ABNORMAL LOW (ref 4.22–5.81)
RDW: 13.2 % (ref 11.5–15.5)
WBC: 6.3 10*3/uL (ref 4.0–10.5)

## 2015-02-17 LAB — I-STAT CG4 LACTIC ACID, ED
LACTIC ACID, VENOUS: 3.05 mmol/L — AB (ref 0.5–2.0)
LACTIC ACID, VENOUS: 0.94 mmol/L (ref 0.5–2.0)

## 2015-02-17 LAB — GLUCOSE, CAPILLARY: Glucose-Capillary: 111 mg/dL — ABNORMAL HIGH (ref 65–99)

## 2015-02-17 LAB — I-STAT TROPONIN, ED: Troponin i, poc: 0 ng/mL (ref 0.00–0.08)

## 2015-02-17 MED ORDER — MIDODRINE HCL 5 MG PO TABS
10.0000 mg | ORAL_TABLET | Freq: Three times a day (TID) | ORAL | Status: DC
  Administered 2015-02-17: 10 mg via ORAL
  Filled 2015-02-17 (×3): qty 2

## 2015-02-17 MED ORDER — SODIUM CHLORIDE 0.9 % IV BOLUS (SEPSIS): 500.0000 mL | Freq: Once | INTRAVENOUS | Status: DC

## 2015-02-17 MED ORDER — SODIUM CHLORIDE 0.9 % IV BOLUS (SEPSIS)
250.0000 mL | Freq: Once | INTRAVENOUS | Status: AC
  Administered 2015-02-17: 250 mL via INTRAVENOUS

## 2015-02-17 NOTE — ED Notes (Signed)
Pt from hyperbaric chamber.  Sent over for hypotension.  Pt states that he got dialysis yesterday and started having a headache with neck pain.  No nuchal rigidity.  T1 diabetic.  BP 64/47.

## 2015-02-17 NOTE — Discharge Instructions (Signed)
Your blood pressure was low today.  It seems to be because you need some extra fluids and needed your Midodrine.  Take your Midodrine three times a day as prescribed.  Follow up at your dialysis appointment tomorrow.  Hipotensin (Hypotension) Cuando el corazn late [facility] sangre a travs de las arterias. La fuerza que se origina es la presin arterial. Si la presin arterial es demasiado baja como para que pueda Optometrist sus actividades normales o suministrar sangre a los rganos, usted sufre de hipotensin. La hipotensin tambin se llama presin arterial baja. Cuando la presin arterial es demasiado baja, es posible que no llegue sangre suficiente al cerebro. [employer], podr sentir debilidad, Tree surgeon o aumento de la frecuencia cardaca. En los casos ms graves puede llegar a [employer]. CAUSAS Hay varias enfermedades que pueden causar hipotensin. Pueden ser:  [place].  Deshidratacin.  Problemas cardacos o endocrinos.  Embarazo.  Infeccin grave.  No consumir una dieta balanceada que contenga los nutrientes necesarios.  Reacciones alrgicas graves (anafilaxis). Algunos medicamentos, como los que se utilizan para Chief Technology Officer la presin arterial o para eliminar los lquidos (diurticos), pueden disminuir la presin arterial a niveles ms bajos [employer]. En algunos casos, tomar demasiados medicamentos o tomarlos sin seguir las indicaciones puede causar hipotensin. TRATAMIENTO  En algunos casos de hipotensin es necesaria la hospitalizacin para reponer lquidos o sangre, si se requiere tiempo para eliminar la medicacin o si se necesitara una evaluacin ms profunda. El tratamiento incluye modificaciones en la dieta, cambios en los medicamentos, (incluyendo medicamentos para elevar la presin arterial) y el uso de medias especiales. INSTRUCCIONES PARA EL CUIDADO EN EL HOGAR   Beba suficiente lquido para mantener la orina clara o de color amarillo plido.  Tome  todos los [employer] como le indic el mdico.  Levntese lentamente si se encuentra acostado o sentado. Esto posibilitar que la presin arterial se adapte.  Use medias de compresin como le haya indicado su mdico.  Siga una dieta saludable e incluya alimentos nutritivos como frutas, vegetales, nueces, granos enteros y carnes [place]. SOLICITE ATENCIN MDICA SI:  Vomita o tiene diarrea.  Tiene fiebre por ms de 2 - 3 das.  Tiene ms sed que lo habitual.  Se siente dbil y cansado. SOLICITE ATENCIN MDICA DE INMEDIATO SI:   Siente dolor en el pecho o latidos cardacos irregulares.  Ha perdido la sensibilidad en alguna parte del cuerpo, o pierde el movimiento de los brazos o las piernas.  Tiene dificultad para hablar.  Est sudoroso o siente mareos.  Se desmaya. ASEGRESE DE QUE:   Comprende estas instrucciones.  Controlar su afeccin.  Recibir ayuda de inmediato si no mejora o si empeora.   Esta informacin no tiene Marine scientist el consejo del mdico. Asegrese de hacerle al mdico cualquier pregunta que tenga.   Document Released: 06/10/2006 Document Revised: 02/17/2013 Elsevier Interactive Patient Education [employer].

## 2015-02-17 NOTE — ED Provider Notes (Signed)
CSN: [id number]     Arrival date & time 02/17/15  [id number] History   First MD Initiated Contact with Patient 02/17/15 [phone]     Chief Complaint  Patient presents with  . Hypotension     (Consider location/radiation/quality/duration/timing/severity/associated sxs/prior Treatment) HPI Comments: 44 y.o. Male with history of DM, wound currently being treated with hyperbaric presents for hypotension.  The patient presented to wound care at the hyperbaric center this morning and was complaining of headache and neck pain that he states started after dialysis yesterday.  He was found to be hypotensive this morning at his wound appointment although the wound looks good and is healing well.  He denies LOC.  He reports aching headache in the back of the head and neck.  No sudden onset of headahce but it has been gradual since dialysis yesterday.  He is able to range his neck without difficulty and it does not feel stiff.  No nausea, vomiting, abdominal pain, chest pain.  He is on Midodrine but has not taken it today.  3L was removed at dialysis yesterday per patient.   Past Medical History  Diagnosis Date  . Hepatitis C   . MRSA infection   . Pancreatitis   . Chronic diarrhea   . HIV INFECTION 06/27/2010    Qualifier: Diagnosis of  By: Nickola Major CMA ( [place]), Geni Bers    . Hypotension 06/02/2012  . Metabolic bone disease [id number]  . Normocytic anemia 06/17/2012  . Severe protein-calorie malnutrition ([place]) 06/17/2012  . Diabetic neuropathy ([place])   . ESRD (end stage renal disease) on dialysis [facility])     "TTS; don't remember street name" (05/03/2014)  . Renal disorder   . Uncontrolled diabetes mellitus with complications ([place]) 0000000    Annotation: uncontrolled Qualifier: Diagnosis of  By: Nickola Major CMA ( [place]), Geni Bers    . Pressure ulcer of BKA stump, stage 2 ([place]) 11/22/2014  . Chronic hepatitis C without hepatic coma ([place]) 11/22/2014  . AIDS ([place]) 11/22/2014   Past Surgical History  Procedure  Laterality Date  . Av fistula placement Left   . Amputation Left 04/20/2014    Procedure: 3rd toe amputation, 4th Toe Amputation,  5th Toe Amputation;  Surgeon: Newt Minion, MD;  Location: [facility];  Service: Orthopedics;  Laterality: Left;  . 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 Amputation Left 05/02/2014    Procedure: Midfoot Amputation;  Surgeon: Newt Minion, MD;  Location: [facility];  Service: Orthopedics;  Laterality: Left;  . Amputation Left 06/17/2014    Procedure: AMPUTATION BELOW KNEE;  Surgeon: Newt Minion, MD;  Location: [facility];  Service: Orthopedics;  Laterality: Left;   Family History  Problem Relation Age of Onset  . Diabetes Mother   . Diabetes Father    Social History  Substance Use Topics  . Smoking status: Never Smoker   . Smokeless tobacco: Never Used  . Alcohol Use: No    Review of Systems  Constitutional: Positive for chills. Negative for fever and fatigue.  HENT: Negative for congestion, postnasal drip and rhinorrhea.   Eyes: Negative for pain and redness.  Respiratory: Negative for cough, chest tightness and shortness of breath.   Cardiovascular: Negative for chest pain and palpitations.  Gastrointestinal: Negative for nausea, vomiting, abdominal pain and diarrhea.  Genitourinary:       Says he does not make urine  Musculoskeletal: Positive for neck pain. Negative for myalgias, back pain and arthralgias.  Skin: Negative for rash.  Neurological: Positive for headaches. Negative for syncope, weakness, light-headedness and numbness.      Allergies  Review of patient's allergies indicates no known allergies.  Home Medications   Prior to Admission medications   Medication Sig Start Date End Date Taking? Authorizing Provider  emtricitabine (EMTRIVA) 200 MG capsule TAKE 1 CAPSULE BY MOUTH EVERY TUESDAY, AND SATURDAY FOLLOWING DIALYSIS 10/17/14  Yes Campbell Riches, MD  insulin lispro protamine-lispro (HUMALOG MIX 75/25) (75-25) 100  UNIT/ML SUSP injection Inject 5 Units into the skin 2 (two) times daily with a meal. 15 units in the am and 15 units in the pm Patient taking differently: Inject 5 Units into the skin daily.  07/29/14  Yes [name], MD  midodrine (PROAMATINE) 10 MG tablet Take 1 tablet (10 mg total) by mouth 3 (three) times daily with meals. Patient taking differently: Take 10 mg by mouth 2 (two) times daily.  04/22/14  Yes Vivi Barrack, MD  naproxen (NAPROSYN) 250 MG tablet Take 1 tablet (250 mg total) by mouth 2 (two) times daily as needed. Patient taking differently: Take 250 mg by mouth 2 (two) times daily as needed for mild pain.  07/29/14  Yes [name], MD  NORVIR 100 MG TABS tablet TAKE 1 TABLET BY MOUTH EVERY DAY WITH BREAKFAST*MUST TAKE WITH PREZISTA** 10/17/14  Yes Campbell Riches, MD  omeprazole (PRILOSEC) 40 MG capsule TAKE 1 CAPSULE BY MOUTH EVERY DAY 11/15/14  Yes Campbell Riches, MD  PREZISTA 800 MG tablet TAKE 1 TABLET BY MOUTH EVERY DAY WITH BREAKFAST 10/17/14  Yes Campbell Riches, MD  tenofovir (VIREAD) 300 MG tablet TAKE 1 TABLET BY MOUTH EVERY WEEK ON SUNDAYS 10/17/14  Yes Campbell Riches, MD  docusate sodium 100 MG CAPS Take 100 mg by mouth 2 (two) times daily. Patient not taking: Reported on 02/17/2015 04/22/14   Vivi Barrack, MD  doxycycline (VIBRAMYCIN) 100 MG capsule Take 1 capsule (100 mg total) by mouth 2 (two) times daily. Patient not taking: Reported on 02/17/2015 11/22/14   Truman Hayward, MD  methocarbamol (ROBAXIN) 500 MG tablet Take 1 tablet (500 mg total) by mouth every 6 (six) hours as needed for muscle spasms. Patient not taking: Reported on 07/29/2014 04/22/14   Vivi Barrack, MD  methocarbamol (ROBAXIN) 500 MG tablet Take 1 tablet (500 mg total) by mouth every 6 (six) hours as needed for muscle spasms. Patient not taking: Reported on 02/17/2015 06/20/14   Newt Minion, MD  ondansetron (ZOFRAN) 4 MG tablet Take 1 tablet (4 mg total) by mouth every 6 (six) hours as  needed for nausea. Patient not taking: Reported on 05/02/2014 04/22/14   Vivi Barrack, MD  oxyCODONE-acetaminophen (PERCOCET/ROXICET) 5-325 MG per tablet Take 1 tablet by mouth every 6 (six) hours as needed for severe pain. Patient not taking: Reported on 02/17/2015 12/10/14   Courteney Lyn Mackuen, MD  polyethylene glycol (MIRALAX / GLYCOLAX) packet Take 17 g by mouth daily as needed for moderate constipation or severe constipation. Patient not taking: Reported on 05/02/2014 04/22/14   Vivi Barrack, MD  sulfamethoxazole-trimethoprim (BACTRIM,SEPTRA) 400-80 MG per tablet Take 1 tablet by mouth daily. Patient not taking: Reported on 02/17/2015 10/17/14   Campbell Riches, MD   BP 114/83 mmHg  Pulse 94  Temp(Src) 98.3 F (36.8 C) (Rectal)  Resp 14  SpO2 100% Physical Exam  Constitutional: He is oriented to person, place, and time. He appears well-developed and well-nourished. No distress.  HENT:  Head: Normocephalic and atraumatic.  Right Ear: External ear normal.  Left Ear: External ear normal.  Mouth/Throat: Oropharynx is clear and moist. No oropharyngeal exudate.  Eyes: EOM are normal. Pupils are equal, round, and reactive to light.  Neck: Normal range of motion. Neck supple.  Cardiovascular: Normal rate, regular rhythm and intact distal pulses.   Pulmonary/Chest: Effort normal. No respiratory distress. He has no wheezes. He has no rales.  Abdominal: Soft. He exhibits no distension. There is no tenderness.  Musculoskeletal: He exhibits no edema.  Left arm fistula in place with appropriate thrill  Neurological: He is alert and oriented to person, place, and time. He has normal strength. No cranial nerve deficit or sensory deficit. He exhibits normal muscle tone. Coordination normal.  Skin: Skin is warm and dry. No rash noted. He is not diaphoretic.  Vitals reviewed.   ED Course  Procedures (including critical care time) Labs Review Labs Reviewed  CBC WITH DIFFERENTIAL/PLATELET -  Abnormal; Notable for the following:    RBC 3.33 (*)    Hemoglobin 11.0 (*)    HCT 33.8 (*)    MCV 101.5 (*)    All other components within normal limits  COMPREHENSIVE METABOLIC PANEL - Abnormal; Notable for the following:    Sodium 133 (*)    Glucose, Bld 303 (*)    BUN 27 (*)    Creatinine, Ser 6.58 (*)    Total Protein 8.7 (*)    AST 73 (*)    ALT 76 (*)    GFR calc non Af Amer 9 (*)    GFR calc Af Amer 11 (*)    All other components within normal limits  I-STAT CG4 LACTIC ACID, ED - Abnormal; Notable for the following:    Lactic Acid, Venous 3.05 (*)    All other components within normal limits  I-STAT TROPOININ, ED  I-STAT CG4 LACTIC ACID, ED    Imaging Review Dg Chest 2 View  02/17/2015   CLINICAL DATA:  Hypotension  EXAM: CHEST - 2 VIEW  COMPARISON:  09/27/2013  FINDINGS: Cardiac shadow is within normal limits. Some patchy interstitial changes are seen without acute abnormality. No bony abnormality is seen. The visualized upper abdomen is within normal limits.  IMPRESSION: No acute abnormality noted.   Electronically Signed   By: Inez Catalina M.D.   On: 02/17/2015 10:52   I have personally reviewed and evaluated these images and lab results as part of my medical decision-making.   EKG Interpretation   Date/Time:  Friday February 17 2015 10:10:27 EDT Ventricular Rate:  94 PR Interval:  132 QRS Duration: 79 QT Interval:  356 QTC Calculation: 445 R Axis:   73 Text Interpretation:  Sinus rhythm Borderline low voltage, extremity leads  Wandering baseline V2 No significant change since last tracing Confirmed  by Jeanmarc Viernes ([id number]) on 02/17/2015 11:19:04 AM      MDM  Patient was seen and evaluated at bedside.  Patient well appearing with benign examination.  History concerning for dehydration.  Afebrile with no sign of acute infection.  Patient has not taken Midodrine and believes he took it yesterday but could not say at what time.  Initial labs unremarkable for  patient other than lactic acidosis.  Patient given gentle hydration and midodrine with improvement of blood pressure, resolution of headache, and normalization of lactic acid.  Patient remained well appearing and said he felt well and wanted to be discharged.  He said he would continue to take his Midodrine  3 times daily as prescribed and would follow up at dialysis tomorrow.  Patient was discharged in stable condition.  Final diagnoses:  Hypotension, unspecified hypotension type    1. Hypotension, resolved  2. Headache, resolved    Harvel Quale, MD 02/17/15 2240

## 2015-02-17 NOTE — ED Notes (Signed)
Dr Alfonse Spruce aware of lactic acid of 3.05

## 2015-02-22 ENCOUNTER — Ambulatory Visit: Payer: Self-pay | Admitting: Infectious Disease

## 2015-03-02 ENCOUNTER — Telehealth: Payer: Self-pay | Admitting: *Deleted

## 2015-03-02 NOTE — Telephone Encounter (Signed)
Call from [facility] specialty to verify patient's regimen (what they had was correct). He has not set up delivery since July of 2016 and no meds have been sent since that time. They did call patient using a Spanish Interpretor and patient never returned the calls. His next appt here with Dr. Tommy Medal is 04/05/15. I called patient and he said he did not receive any calls and he can not call as he does not speak [place]. Explained that he only has to choose the option for Spanish. He said it use to come automatically and three months ago  it stopped. I spoke to an [place] speaking friend of the patient and she took the # for the pharmacy. She said she would call and arrange delivery of his meds. I also called his case worker. Megan at [facility] and she will follow up with the patient. Myrtis Hopping

## 2015-03-17 ENCOUNTER — Encounter (HOSPITAL_BASED_OUTPATIENT_CLINIC_OR_DEPARTMENT_OTHER): Payer: No Typology Code available for payment source | Attending: Internal Medicine

## 2015-03-17 DIAGNOSIS — E1151 Type 2 diabetes mellitus with diabetic peripheral angiopathy without gangrene: Secondary | ICD-10-CM | POA: Insufficient documentation

## 2015-03-17 DIAGNOSIS — E1122 Type 2 diabetes mellitus with diabetic chronic kidney disease: Secondary | ICD-10-CM | POA: Insufficient documentation

## 2015-03-17 DIAGNOSIS — B1920 Unspecified viral hepatitis C without hepatic coma: Secondary | ICD-10-CM | POA: Insufficient documentation

## 2015-03-17 DIAGNOSIS — E1140 Type 2 diabetes mellitus with diabetic neuropathy, unspecified: Secondary | ICD-10-CM | POA: Insufficient documentation

## 2015-03-17 DIAGNOSIS — I120 Hypertensive chronic kidney disease with stage 5 chronic kidney disease or end stage renal disease: Secondary | ICD-10-CM | POA: Insufficient documentation

## 2015-03-17 DIAGNOSIS — Y835 Amputation of limb(s) as the cause of abnormal reaction of the patient, or of later complication, without mention of misadventure at the time of the procedure: Secondary | ICD-10-CM | POA: Insufficient documentation

## 2015-03-17 DIAGNOSIS — N186 End stage renal disease: Secondary | ICD-10-CM | POA: Insufficient documentation

## 2015-03-17 DIAGNOSIS — B20 Human immunodeficiency virus [HIV] disease: Secondary | ICD-10-CM | POA: Insufficient documentation

## 2015-03-17 DIAGNOSIS — T8131XA Disruption of external operation (surgical) wound, not elsewhere classified, initial encounter: Secondary | ICD-10-CM | POA: Insufficient documentation

## 2015-03-17 DIAGNOSIS — E11622 Type 2 diabetes mellitus with other skin ulcer: Secondary | ICD-10-CM | POA: Insufficient documentation

## 2015-03-17 DIAGNOSIS — L97822 Non-pressure chronic ulcer of other part of left lower leg with fat layer exposed: Secondary | ICD-10-CM | POA: Insufficient documentation

## 2015-03-27 ENCOUNTER — Other Ambulatory Visit: Payer: Self-pay | Admitting: Infectious Disease

## 2015-03-27 ENCOUNTER — Other Ambulatory Visit: Payer: Self-pay | Admitting: Infectious Diseases

## 2015-04-05 ENCOUNTER — Ambulatory Visit (INDEPENDENT_AMBULATORY_CARE_PROVIDER_SITE_OTHER): Payer: No Typology Code available for payment source | Admitting: Infectious Disease

## 2015-04-05 ENCOUNTER — Encounter: Payer: Self-pay | Admitting: Infectious Disease

## 2015-04-05 VITALS — BP 99/58 | HR 92 | Temp 97.6°F | Wt 103.0 lb

## 2015-04-05 DIAGNOSIS — M869 Osteomyelitis, unspecified: Secondary | ICD-10-CM

## 2015-04-05 DIAGNOSIS — L89892 Pressure ulcer of other site, stage 2: Secondary | ICD-10-CM

## 2015-04-05 DIAGNOSIS — N186 End stage renal disease: Secondary | ICD-10-CM

## 2015-04-05 DIAGNOSIS — B20 Human immunodeficiency virus [HIV] disease: Secondary | ICD-10-CM

## 2015-04-05 DIAGNOSIS — Z992 Dependence on renal dialysis: Secondary | ICD-10-CM

## 2015-04-05 DIAGNOSIS — E1169 Type 2 diabetes mellitus with other specified complication: Secondary | ICD-10-CM

## 2015-04-05 DIAGNOSIS — B182 Chronic viral hepatitis C: Secondary | ICD-10-CM

## 2015-04-05 DIAGNOSIS — T8789 Other complications of amputation stump: Secondary | ICD-10-CM

## 2015-04-05 MED ORDER — LAMIVUDINE 10 MG/ML PO SOLN: 50.0000 mg | Freq: Every day | ORAL | Status: DC

## 2015-04-05 NOTE — Patient Instructions (Signed)
Pharmacy appt in a month to review your regimen with Spanish translator

## 2015-04-05 NOTE — Progress Notes (Signed)
Chief complaint: followup for HIV, Chronic hep C and wound  Subjective:    Patient ID: Alvin Daniels, male    DOB: [birth year], 44 y.o.   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   He claims to be highly adherent to his ARV regimen   Lab Results  Component Value Date   HIV1RNAQUANT <20 11/22/2014   Lab Results  Component Value Date   CD4TABS 200* 11/22/2014   CD4TABS 140* 06/13/2014   CD4TABS 170* 04/19/2014    He has chronic hepatitis C without hepatic coma genotype 1 but has not yet been rx.   He also has developed an ulcer near his BKA site due to dressing and this persists. He is going to wound care to have this addressed.  Past Medical History  Diagnosis Date  . Hepatitis C   . MRSA infection   . Pancreatitis   . Chronic diarrhea   . HIV INFECTION 06/27/2010    Qualifier: Diagnosis of  By: Nickola Major CMA ( [place]), Geni Bers    . Hypotension 06/02/2012  . Metabolic bone disease [id number]  . Normocytic anemia 06/17/2012  . Severe protein-calorie malnutrition ([place]) 06/17/2012  . Diabetic neuropathy ([place])   . ESRD (end stage renal disease) on dialysis [facility])     "TTS; don't remember street name" (05/03/2014)  . Renal disorder   . Uncontrolled diabetes mellitus with complications ([place]) 0000000    Annotation: uncontrolled Qualifier: Diagnosis of  By: Nickola Major CMA ( [place]), Geni Bers    . Pressure ulcer of BKA stump, stage 2 ([place]) 11/22/2014  . Chronic hepatitis C without hepatic coma ([place]) 11/22/2014  . AIDS ([place]) 11/22/2014    Past Surgical History  Procedure Laterality Date  . Av fistula placement Left   . Amputation Left 04/20/2014    Procedure: 3rd toe amputation, 4th Toe Amputation,  5th Toe Amputation;  Surgeon: Newt Minion, MD;  Location: [place];  Service: Orthopedics;  Laterality: Left;  . Foot amputation through ankle Left  12/'21/2015    midfoot  . Amputation Left 05/02/2014    Procedure: Midfoot Amputation;  Surgeon: Newt Minion, MD;  Location: Home;  Service: Orthopedics;  Laterality: Left;  . Amputation Left 06/17/2014    Procedure: AMPUTATION BELOW KNEE;  Surgeon: Newt Minion, MD;  Location: [place];  Service: Orthopedics;  Laterality: Left;    Family History  Problem Relation Age of Onset  . Diabetes Mother   . Diabetes Father       Social History   Social History  . Marital Status: Single    Spouse Name: N/A  . Number of Children: N/A  . Years of Education: N/A   Social History Main Topics  . Smoking status: Never Smoker   . Smokeless tobacco: Never Used  . Alcohol Use: No  . Drug Use: No  . Sexual Activity: No   Other Topics Concern  . None   Social History Narrative   ** Merged History Encounter **        No Known Allergies   Current outpatient prescriptions:  .  docusate sodium 100 MG CAPS, Take 100 mg by mouth 2 (two) times daily. (Patient not taking: Reported on 02/17/2015), Disp: 30 capsule, Rfl: 0 .  doxycycline (VIBRAMYCIN) 100 MG capsule, Take 1 capsule (100 mg total) by mouth 2 (two) times daily. (Patient not taking: Reported  on 02/17/2015), Disp: 60 capsule, Rfl: 1 .  insulin lispro protamine-lispro (HUMALOG MIX 75/25) (75-25) 100 UNIT/ML SUSP injection, Inject 5 Units into the skin 2 (two) times daily with a meal. 15 units in the am and 15 units in the pm (Patient taking differently: Inject 5 Units into the skin daily. ), Disp: 10 mL, Rfl: 2 .  lamiVUDine (EPIVIR) 10 MG/ML solution, Take 5 mLs (50 mg total) by mouth daily., Disp: 240 mL, Rfl: 12 .  methocarbamol (ROBAXIN) 500 MG tablet, Take 1 tablet (500 mg total) by mouth every 6 (six) hours as needed for muscle spasms. (Patient not taking: Reported on 07/29/2014), Disp: 30 tablet, Rfl: 0 .  methocarbamol (ROBAXIN) 500 MG tablet, Take 1 tablet (500 mg total) by mouth every 6 (six) hours as needed for muscle spasms.  (Patient not taking: Reported on 02/17/2015), Disp: 40 tablet, Rfl: 1 .  midodrine (PROAMATINE) 10 MG tablet, Take 1 tablet (10 mg total) by mouth 3 (three) times daily with meals. (Patient taking differently: Take 10 mg by mouth 2 (two) times daily. ), Disp: 90 tablet, Rfl: 0 .  naproxen (NAPROSYN) 250 MG tablet, Take 1 tablet (250 mg total) by mouth 2 (two) times daily as needed. (Patient taking differently: Take 250 mg by mouth 2 (two) times daily as needed for mild pain. ), Disp: 60 tablet, Rfl: 1 .  omeprazole (PRILOSEC) 40 MG capsule, TAKE 1 CAPSULE BY MOUTH EVERY DAY, Disp: 90 capsule, Rfl: 0 .  ondansetron (ZOFRAN) 4 MG tablet, Take 1 tablet (4 mg total) by mouth every 6 (six) hours as needed for nausea. (Patient not taking: Reported on 05/02/2014), Disp: 20 tablet, Rfl: 0 .  oxyCODONE-acetaminophen (PERCOCET/ROXICET) 5-325 MG per tablet, Take 1 tablet by mouth every 6 (six) hours as needed for severe pain. (Patient not taking: Reported on 02/17/2015), Disp: 7 tablet, Rfl: 0 .  polyethylene glycol (MIRALAX / GLYCOLAX) packet, Take 17 g by mouth daily as needed for moderate constipation or severe constipation. (Patient not taking: Reported on 05/02/2014), Disp: 14 each, Rfl: 0 .  sulfamethoxazole-trimethoprim (BACTRIM,SEPTRA) 400-80 MG per tablet, Take 1 tablet by mouth daily. (Patient not taking: Reported on 02/17/2015), Disp: 30 tablet, Rfl: 5 .  tenofovir (VIREAD) 300 MG tablet, TAKE 1 TABLET BY MOUTH EVERY WEEK ON SUNDAYS, Disp: 5 tablet, Rfl: 3  Review of Systems  Constitutional: Negative for fever, chills, diaphoresis, activity change, appetite change, fatigue and unexpected weight change.  HENT: Negative for congestion, rhinorrhea, sinus pressure, sneezing, sore throat and trouble swallowing.   Eyes: Negative for photophobia and visual disturbance.  Respiratory: Negative for cough, chest tightness, shortness of breath, wheezing and stridor.   Cardiovascular: Negative for chest pain,  palpitations and leg swelling.  Gastrointestinal: Negative for nausea, vomiting, abdominal pain, diarrhea, constipation, blood in stool, abdominal distention and anal bleeding.  Genitourinary: Negative for dysuria, hematuria, flank pain and difficulty urinating.  Musculoskeletal: Negative for myalgias, back pain, joint swelling, arthralgias and gait problem.  Skin: Positive for wound. Negative for color change, pallor and rash.  Neurological: Negative for dizziness, tremors, weakness and light-headedness.  Hematological: Negative for adenopathy. Does not bruise/bleed easily.  Psychiatric/Behavioral: Negative for behavioral problems, confusion, sleep disturbance, dysphoric mood, decreased concentration and agitation.       Objective:   Physical Exam  Constitutional: He is oriented to person, place, and time. No distress.  HENT:  Head: Normocephalic.  Mouth/Throat: Oropharynx is clear and moist. No oropharyngeal exudate.  Eyes: Conjunctivae and EOM are normal.  No scleral icterus.  Neck: Normal range of motion.  Cardiovascular: Normal rate and regular rhythm.   Pulmonary/Chest: Effort normal. No respiratory distress. He has no wheezes.  Abdominal: Soft. He exhibits no distension. There is no tenderness.  Musculoskeletal: Normal range of motion.  Neurological: He is alert and oriented to person, place, and time.  Skin: Skin is dry. He is not diaphoretic.  Psychiatric: He has a normal mood and affect. His behavior is normal. Thought content normal.    Left BKA site moist with some drainage  11/22/14:     04/05/15:          Assessment & Plan:   Wound at  Stump site:  I have some anxiety re the chronicity of this. May hazard needing an AKA at some point  HIV/AIDS: re-craft his regimen to [facility] daily with daily Epivir 50mg  daily and weekly Viread to better fit with HCV regimen  CHronic hepatitis C without coma: he DOES NOT have any R on NS5A testing. 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 He is medicare but appears wtihout rx drug plan  --recheck HCV VL  --at present correct recommended treatment  would be ZEPATIER--which we should obtain via [place]   Insulin dependent dM with ESRD: He requested that I refill his insulin but I would prefer if his PCP would do this  I spent greater than 40 minutes with the patient including greater than 50% of time in face to face counsel of the patient re his HIV, chronic hep C , BKA stump wound  and in coordination of his  care.

## 2015-04-14 ENCOUNTER — Encounter (HOSPITAL_BASED_OUTPATIENT_CLINIC_OR_DEPARTMENT_OTHER): Payer: No Typology Code available for payment source | Attending: Internal Medicine

## 2015-04-14 DIAGNOSIS — Z89519 Acquired absence of unspecified leg below knee: Secondary | ICD-10-CM | POA: Insufficient documentation

## 2015-04-14 DIAGNOSIS — E11622 Type 2 diabetes mellitus with other skin ulcer: Secondary | ICD-10-CM | POA: Insufficient documentation

## 2015-04-14 DIAGNOSIS — L97822 Non-pressure chronic ulcer of other part of left lower leg with fat layer exposed: Secondary | ICD-10-CM | POA: Insufficient documentation

## 2015-04-14 DIAGNOSIS — Z992 Dependence on renal dialysis: Secondary | ICD-10-CM | POA: Insufficient documentation

## 2015-04-14 DIAGNOSIS — E1122 Type 2 diabetes mellitus with diabetic chronic kidney disease: Secondary | ICD-10-CM | POA: Insufficient documentation

## 2015-04-14 DIAGNOSIS — E1140 Type 2 diabetes mellitus with diabetic neuropathy, unspecified: Secondary | ICD-10-CM | POA: Insufficient documentation

## 2015-04-14 DIAGNOSIS — B1920 Unspecified viral hepatitis C without hepatic coma: Secondary | ICD-10-CM | POA: Insufficient documentation

## 2015-04-14 DIAGNOSIS — N186 End stage renal disease: Secondary | ICD-10-CM | POA: Insufficient documentation

## 2015-04-14 DIAGNOSIS — Z21 Asymptomatic human immunodeficiency virus [HIV] infection status: Secondary | ICD-10-CM | POA: Insufficient documentation

## 2015-04-18 ENCOUNTER — Other Ambulatory Visit: Payer: Self-pay | Admitting: *Deleted

## 2015-04-18 DIAGNOSIS — B20 Human immunodeficiency virus [HIV] disease: Secondary | ICD-10-CM

## 2015-04-18 MED ORDER — TENOFOVIR DISOPROXIL FUMARATE 300 MG PO TABS: ORAL_TABLET | ORAL | Status: DC

## 2015-04-18 MED ORDER — DOLUTEGRAVIR SODIUM 50 MG PO TABS: 50.0000 mg | ORAL_TABLET | Freq: Every day | ORAL | Status: DC

## 2015-04-18 NOTE — Telephone Encounter (Signed)
Current regimen?  RN read Dr Derek Mound last office note.  Pt's regimen = Tivicay 50 mg daily, Viread 300 mg weekly and Epivir 50 mg/5 mL daily.

## 2015-04-20 NOTE — Telephone Encounter (Signed)
Patient needs to fill out form at front desk that allows medical information to be shared with others

## 2015-04-25 ENCOUNTER — Telehealth: Payer: Self-pay | Admitting: Pharmacy Technician

## 2015-05-02 ENCOUNTER — Telehealth: Payer: Self-pay | Admitting: Pharmacy Technician

## 2015-05-09 NOTE — Telephone Encounter (Signed)
error 

## 2015-05-17 ENCOUNTER — Ambulatory Visit: Payer: No Typology Code available for payment source | Admitting: Infectious Disease

## 2015-05-25 ENCOUNTER — Other Ambulatory Visit: Payer: Self-pay | Admitting: Infectious Diseases

## 2015-05-25 DIAGNOSIS — B20 Human immunodeficiency virus [HIV] disease: Secondary | ICD-10-CM

## 2015-05-26 ENCOUNTER — Encounter (HOSPITAL_BASED_OUTPATIENT_CLINIC_OR_DEPARTMENT_OTHER): Payer: No Typology Code available for payment source | Attending: Internal Medicine

## 2015-05-26 DIAGNOSIS — Z992 Dependence on renal dialysis: Secondary | ICD-10-CM | POA: Insufficient documentation

## 2015-05-26 DIAGNOSIS — E1140 Type 2 diabetes mellitus with diabetic neuropathy, unspecified: Secondary | ICD-10-CM | POA: Insufficient documentation

## 2015-05-26 DIAGNOSIS — B1920 Unspecified viral hepatitis C without hepatic coma: Secondary | ICD-10-CM | POA: Insufficient documentation

## 2015-05-26 DIAGNOSIS — Y835 Amputation of limb(s) as the cause of abnormal reaction of the patient, or of later complication, without mention of misadventure at the time of the procedure: Secondary | ICD-10-CM | POA: Insufficient documentation

## 2015-05-26 DIAGNOSIS — N186 End stage renal disease: Secondary | ICD-10-CM | POA: Insufficient documentation

## 2015-05-26 DIAGNOSIS — T8789 Other complications of amputation stump: Secondary | ICD-10-CM | POA: Insufficient documentation

## 2015-05-26 DIAGNOSIS — B20 Human immunodeficiency virus [HIV] disease: Secondary | ICD-10-CM | POA: Insufficient documentation

## 2015-05-26 DIAGNOSIS — Z89512 Acquired absence of left leg below knee: Secondary | ICD-10-CM | POA: Insufficient documentation

## 2015-05-26 DIAGNOSIS — L97821 Non-pressure chronic ulcer of other part of left lower leg limited to breakdown of skin: Secondary | ICD-10-CM | POA: Insufficient documentation

## 2015-05-26 DIAGNOSIS — E1151 Type 2 diabetes mellitus with diabetic peripheral angiopathy without gangrene: Secondary | ICD-10-CM | POA: Insufficient documentation

## 2015-05-26 DIAGNOSIS — E1122 Type 2 diabetes mellitus with diabetic chronic kidney disease: Secondary | ICD-10-CM | POA: Insufficient documentation

## 2015-05-31 ENCOUNTER — Telehealth: Payer: Self-pay | Admitting: Pharmacy Technician

## 2015-06-07 ENCOUNTER — Ambulatory Visit: Payer: Self-pay | Admitting: *Deleted

## 2015-06-07 ENCOUNTER — Ambulatory Visit (INDEPENDENT_AMBULATORY_CARE_PROVIDER_SITE_OTHER): Payer: Self-pay | Admitting: Infectious Disease

## 2015-06-07 VITALS — BP 104/62 | HR 93 | Temp 97.8°F | Wt 105.0 lb

## 2015-06-07 DIAGNOSIS — E1169 Type 2 diabetes mellitus with other specified complication: Secondary | ICD-10-CM

## 2015-06-07 DIAGNOSIS — Z992 Dependence on renal dialysis: Secondary | ICD-10-CM

## 2015-06-07 DIAGNOSIS — Z89511 Acquired absence of right leg below knee: Secondary | ICD-10-CM

## 2015-06-07 DIAGNOSIS — L89892 Pressure ulcer of other site, stage 2: Secondary | ICD-10-CM

## 2015-06-07 DIAGNOSIS — N186 End stage renal disease: Secondary | ICD-10-CM

## 2015-06-07 DIAGNOSIS — B20 Human immunodeficiency virus [HIV] disease: Secondary | ICD-10-CM

## 2015-06-07 DIAGNOSIS — Z89512 Acquired absence of left leg below knee: Secondary | ICD-10-CM

## 2015-06-07 DIAGNOSIS — T8789 Other complications of amputation stump: Secondary | ICD-10-CM

## 2015-06-07 DIAGNOSIS — E1122 Type 2 diabetes mellitus with diabetic chronic kidney disease: Secondary | ICD-10-CM

## 2015-06-07 DIAGNOSIS — B182 Chronic viral hepatitis C: Secondary | ICD-10-CM

## 2015-06-07 DIAGNOSIS — R55 Syncope and collapse: Secondary | ICD-10-CM

## 2015-06-07 DIAGNOSIS — M869 Osteomyelitis, unspecified: Secondary | ICD-10-CM

## 2015-06-07 MED ORDER — MIDODRINE HCL 10 MG PO TABS: 10.0000 mg | ORAL_TABLET | Freq: Two times a day (BID) | ORAL | Status: DC

## 2015-06-07 MED ORDER — ELBASVIR-GRAZOPREVIR 50-100 MG PO TABS: 1.0000 | ORAL_TABLET | Freq: Every day | ORAL | Status: DC

## 2015-06-07 MED ORDER — MIDODRINE HCL 10 MG PO TABS: 10.0000 mg | ORAL_TABLET | Freq: Three times a day (TID) | ORAL | Status: DC

## 2015-06-07 NOTE — Addendum Note (Signed)
Addended by: Dinah Beers on: 06/07/2015 03:48 PM   Modules accepted: Orders

## 2015-06-07 NOTE — Progress Notes (Signed)
Chief complaint: followup for HIV, HCV chronic wound, "my wound is not getting better"  Subjective:    Patient ID: Alvin Daniels, male    DOB: Jul 26, 1970, 45 y.o.   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   He claims to be highly adherent to his ARV regimen, though there is some confusion re one drug which sounds like it would be epivir liquid possibly.  His BP was VERY low when he presented to clinic today in 60s over 40s though he denies symptoms and said his blood pressure has been much lower "you should have seen me when I passed out."  We were setting him up for Zepatier to treat his HCV but he needed to sign some papers which we had him sign today.  He is further supposed to be on midodrine but has not had access to it having apparently being given samples by RN at [facility].  He denies symptoms at present.  He has needed [facility] to make appts and Audelia Hives helped organize this for him 48 hours before this visit.  He is concerned by the fact that his wound has not healed. He is following at the [facility] wound [facility].  Past Medical History  Diagnosis Date  . Hepatitis C   . MRSA infection   . Pancreatitis   . Chronic diarrhea   . HIV INFECTION 06/27/2010    Qualifier: Diagnosis of  By: Nickola Major CMA ( [place]), Geni Bers    . Hypotension 06/02/2012  . Metabolic bone disease [id number]  . Normocytic anemia 06/17/2012  . Severe protein-calorie malnutrition ([place]) 06/17/2012  . Diabetic neuropathy ([place])   . ESRD (end stage renal disease) on dialysis [facility])     "TTS; don't remember street name" (05/03/2014)  . Renal disorder   . Uncontrolled diabetes mellitus with complications ([place]) 0000000    Annotation: uncontrolled Qualifier: Diagnosis of  By: Nickola Major CMA ( [place]), Geni Bers    . Pressure ulcer of BKA stump, stage 2 ([place])  11/22/2014  . Chronic hepatitis C without hepatic coma ([place]) 11/22/2014  . AIDS ([place]) 11/22/2014    Past Surgical History  Procedure Laterality Date  . Av fistula placement Left   . Amputation Left 04/20/2014    Procedure: 3rd toe amputation, 4th Toe Amputation,  5th Toe Amputation;  Surgeon: Newt Minion, MD;  Location: [place];  Service: Orthopedics;  Laterality: Left;  . 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 Amputation Left 05/02/2014    Procedure: Midfoot Amputation;  Surgeon: Newt Minion, MD;  Location: [place];  Service: Orthopedics;  Laterality: Left;  . Amputation Left 06/17/2014    Procedure: AMPUTATION BELOW KNEE;  Surgeon: Newt Minion, MD;  Location: [place];  Service: Orthopedics;  Laterality: Left;    Family History  Problem Relation Age of Onset  . Diabetes Mother   . Diabetes Father       Social History   Social History  . Marital Status: Single    Spouse Name: N/A  . Number of Children: N/A  . Years of Education: N/A   Social History Main Topics  . Smoking status: Never Smoker   . Smokeless tobacco: Never Used  . Alcohol Use: No  . Drug Use: No  . Sexual Activity: No   Other Topics Concern  . Not on file  Social History Narrative   ** Merged History Encounter **        No Known Allergies   Current outpatient prescriptions:  .  dolutegravir (TIVICAY) 50 MG tablet, Take 1 tablet (50 mg total) by mouth daily., Disp: 30 tablet, Rfl: 5 .  insulin lispro protamine-lispro (HUMALOG MIX 75/25) (75-25) 100 UNIT/ML SUSP injection, Inject 5 Units into the skin 2 (two) times daily with a meal. 15 units in the am and 15 units in the pm (Patient taking differently: Inject 5 Units into the skin daily. ), Disp: 10 mL, Rfl: 2 .  lamiVUDine (EPIVIR) 10 MG/ML solution, Take 5 mLs (50 mg total) by mouth daily., Disp: 240 mL, Rfl: 12 .  methocarbamol (ROBAXIN) 500 MG tablet, Take 1 tablet (500 mg total) by mouth every 6 (six) hours as needed for  muscle spasms., Disp: 30 tablet, Rfl: 0 .  omeprazole (PRILOSEC) 40 MG capsule, TAKE 1 CAPSULE BY MOUTH EVERY DAY, Disp: 90 capsule, Rfl: 0 .  polyethylene glycol (MIRALAX / GLYCOLAX) packet, Take 17 g by mouth daily as needed for moderate constipation or severe constipation., Disp: 14 each, Rfl: 0 .  sulfamethoxazole-trimethoprim (BACTRIM,SEPTRA) 400-80 MG tablet, TAKE 1 TABLET BY MOUTH DAILY, Disp: 30 tablet, Rfl: 5 .  tenofovir (VIREAD) 300 MG tablet, TAKE 1 TABLET BY MOUTH EVERY WEEK ON SUNDAYS, Disp: 5 tablet, Rfl: 5 .  Elbasvir-Grazoprevir (ZEPATIER) 50-100 MG TABS, Take 1 tablet by mouth daily., Disp: 28 tablet, Rfl: 2 .  midodrine (PROAMATINE) 10 MG tablet, Take 1 tablet (10 mg total) by mouth 2 (two) times daily., Disp: 60 tablet, Rfl: 11 .  ondansetron (ZOFRAN) 4 MG tablet, Take 1 tablet (4 mg total) by mouth every 6 (six) hours as needed for nausea. (Patient not taking: Reported on 05/02/2014), Disp: 20 tablet, Rfl: 0    Review of Systems  Constitutional: Negative for fever, chills, diaphoresis, activity change, appetite change, fatigue and unexpected weight change.  HENT: Negative for congestion, rhinorrhea, sinus pressure, sneezing, sore throat and trouble swallowing.   Eyes: Negative for photophobia and visual disturbance.  Respiratory: Negative for cough, chest tightness, shortness of breath, wheezing and stridor.   Cardiovascular: Negative for chest pain, palpitations and leg swelling.  Gastrointestinal: Negative for nausea, vomiting, abdominal pain, diarrhea, constipation, blood in stool, abdominal distention and anal bleeding.  Genitourinary: Negative for dysuria, hematuria, flank pain and difficulty urinating.  Musculoskeletal: Negative for myalgias, back pain, joint swelling, arthralgias and gait problem.  Skin: Positive for wound. Negative for color change, pallor and rash.  Neurological: Negative for dizziness, tremors, weakness and light-headedness.  Hematological:  Negative for adenopathy. Does not bruise/bleed easily.  Psychiatric/Behavioral: Negative for behavioral problems, confusion, sleep disturbance, dysphoric mood, decreased concentration and agitation.       Objective:   Physical Exam  Constitutional: He is oriented to person, place, and time.  HENT:  Head: Normocephalic and atraumatic.  Eyes: Conjunctivae and EOM are normal.  Neck: Normal range of motion. Neck supple.  Cardiovascular: Normal rate and regular rhythm.   Pulmonary/Chest: Effort normal. No respiratory distress. He has no wheezes.  Abdominal: Soft. He exhibits no distension.  Musculoskeletal: Normal range of motion. He exhibits no edema or tenderness.  Neurological: He is alert and oriented to person, place, and time.  Skin: Skin is warm.  Psychiatric: He has a normal mood and affect. His behavior is normal. Judgment and thought content normal.        Left BKA site moist with some drainage  11/22/14:     04/05/15:     06/07/15:          Assessment & Plan:   Wound at  Stump site:  I have remain with some  anxiety re the chronicity of this. May hazard needing an AKA at some point. I will get plain films at next visit  HIV/AIDS: currently on  TIvicay daily with daily Epivir 50mg  daily and weekly Viread  And will check labs in 1 week rather than today when BP was so low  Hypotension: likely due to HD and in setting of not eating or drinking this am and not being on midodrine. BP went to 100 with coca cola snacks. I have sent in rx for midodrine in and we are [facility] get [facility]  CHronic hepatitis C without coma: he DOES NOT have any R on NS5A testing. 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 He is medicare but appears wtihout rx drug plan. We are filling out paperwork for [employer] for Zepatier and will have Ambre bring the medicine to him. She can also help monitor his wound.   Insulin dependent dM with ESRD: [facility] to [facility] with  [facility]  I spent greater than 40 minutes with the patient including greater than 50% of time in face to face counsel of the patient re his hypotension, his  HIV, chronic hep C , BKA stump wound  and in coordination of his  care.

## 2015-06-07 NOTE — Patient Instructions (Signed)
We WILL GET YOUR ADAP RENEWED  WE ARE WORKING ON YOUR HEPATITIS C TREATMENT  IF WE DO NOT ADMIT YOU TODAY TO TREAT YOUR LOW BLOOD PRESSURE WE WILL DO THE FOLLOWING:   I NEED YOU TO MAKE AN APPT WITH PHARMACY IN 2 WEEKS AND HAVE YOUR BLOOD PRESSURE CHECKED AGAIN  PLEASE MAKE APPT TO SEE DR. VAN DAM IN 6 WEEKS  I NEED FOR YOUR KIDNEY DOCTORS TO HELP YOU GET THE MIDODRINE WE NEED TO KEEP YOUR BLOOD PRESSURE HIGHER

## 2015-06-12 ENCOUNTER — Other Ambulatory Visit: Payer: No Typology Code available for payment source | Admitting: *Deleted

## 2015-06-13 ENCOUNTER — Ambulatory Visit: Payer: No Typology Code available for payment source | Admitting: *Deleted

## 2015-06-13 ENCOUNTER — Encounter: Payer: Self-pay | Admitting: Pharmacy Technician

## 2015-06-13 ENCOUNTER — Other Ambulatory Visit (INDEPENDENT_AMBULATORY_CARE_PROVIDER_SITE_OTHER): Payer: Self-pay

## 2015-06-13 ENCOUNTER — Other Ambulatory Visit: Payer: Self-pay | Admitting: Infectious Disease

## 2015-06-13 ENCOUNTER — Ambulatory Visit: Payer: Self-pay | Admitting: *Deleted

## 2015-06-13 VITALS — BP 96/60 | HR 95 | Temp 97.5°F

## 2015-06-13 DIAGNOSIS — Z113 Encounter for screening for infections with a predominantly sexual mode of transmission: Secondary | ICD-10-CM

## 2015-06-13 DIAGNOSIS — B20 Human immunodeficiency virus [HIV] disease: Secondary | ICD-10-CM

## 2015-06-13 DIAGNOSIS — B182 Chronic viral hepatitis C: Secondary | ICD-10-CM

## 2015-06-13 LAB — COMPLETE METABOLIC PANEL WITH GFR
ALBUMIN: 3.3 g/dL — AB (ref 3.6–5.1)
ALK PHOS: 75 U/L (ref 40–115)
ALT: 48 U/L — AB (ref 9–46)
AST: 51 U/L — AB (ref 10–40)
BILIRUBIN TOTAL: 0.4 mg/dL (ref 0.2–1.2)
BUN: 9 mg/dL (ref 7–25)
CALCIUM: 7.9 mg/dL — AB (ref 8.6–10.3)
CO2: 34 mmol/L — ABNORMAL HIGH (ref 20–31)
CREATININE: 4.38 mg/dL — AB (ref 0.60–1.35)
Chloride: 96 mmol/L — ABNORMAL LOW (ref 98–110)
GFR, Est African American: 18 mL/min — ABNORMAL LOW (ref >=60)
GFR, Est Non African American: 15 mL/min — ABNORMAL LOW (ref >=60)
GLUCOSE: 220 mg/dL — AB (ref 65–99)
Potassium: 3.8 mmol/L (ref 3.5–5.3)
SODIUM: 137 mmol/L (ref 135–146)
TOTAL PROTEIN: 7.5 g/dL (ref 6.1–8.1)

## 2015-06-13 LAB — CBC WITH DIFFERENTIAL/PLATELET
Basophils Absolute: 0.1 10*3/uL (ref 0.0–0.1)
Basophils Relative: 2 % — ABNORMAL HIGH (ref 0–1)
EOS ABS: 0.4 10*3/uL (ref 0.0–0.7)
Eosinophils Relative: 11 % — ABNORMAL HIGH (ref 0–5)
HCT: 32.6 % — ABNORMAL LOW (ref 39.0–52.0)
Hemoglobin: 11.1 g/dL — ABNORMAL LOW (ref 13.0–17.0)
LYMPHS ABS: 1 10*3/uL (ref 0.7–4.0)
Lymphocytes Relative: 30 % (ref 12–46)
MCH: 33 pg (ref 26.0–34.0)
MCHC: 34 g/dL (ref 30.0–36.0)
MCV: 97 fL (ref 78.0–100.0)
MONO ABS: 0.3 10*3/uL (ref 0.1–1.0)
MONOS PCT: 8 % (ref 3–12)
MPV: 9.6 fL (ref 8.6–12.4)
NEUTROS PCT: 49 % (ref 43–77)
Neutro Abs: 1.7 10*3/uL (ref 1.7–7.7)
PLATELETS: 159 10*3/uL (ref 150–400)
RBC: 3.36 MIL/uL — ABNORMAL LOW (ref 4.22–5.81)
RDW: 12.9 % (ref 11.5–15.5)
WBC: 3.4 10*3/uL — ABNORMAL LOW (ref 4.0–10.5)

## 2015-06-13 MED ORDER — LAMIVUDINE 100 MG PO TABS: 100.0000 mg | ORAL_TABLET | Freq: Every day | ORAL | Status: DC

## 2015-06-14 LAB — HIV-1 RNA QUANT-NO REFLEX-BLD: HIV 1 RNA Quant: <20 copies/mL (ref <20)

## 2015-06-14 LAB — T-HELPER CELL (CD4) - (RCID CLINIC ONLY)
CD4 % Helper T Cell: 20 % — ABNORMAL LOW (ref 33–55)
CD4 T Cell Abs: 200 /uL — ABNORMAL LOW (ref 400–2700)

## 2015-06-14 LAB — RPR

## 2015-06-15 ENCOUNTER — Other Ambulatory Visit: Payer: Self-pay | Admitting: Infectious Diseases

## 2015-06-16 ENCOUNTER — Encounter (HOSPITAL_BASED_OUTPATIENT_CLINIC_OR_DEPARTMENT_OTHER): Payer: No Typology Code available for payment source | Attending: Internal Medicine

## 2015-06-16 DIAGNOSIS — B1920 Unspecified viral hepatitis C without hepatic coma: Secondary | ICD-10-CM | POA: Insufficient documentation

## 2015-06-16 DIAGNOSIS — Z992 Dependence on renal dialysis: Secondary | ICD-10-CM | POA: Insufficient documentation

## 2015-06-16 DIAGNOSIS — E1151 Type 2 diabetes mellitus with diabetic peripheral angiopathy without gangrene: Secondary | ICD-10-CM | POA: Insufficient documentation

## 2015-06-16 DIAGNOSIS — Z89519 Acquired absence of unspecified leg below knee: Secondary | ICD-10-CM | POA: Insufficient documentation

## 2015-06-16 DIAGNOSIS — Y835 Amputation of limb(s) as the cause of abnormal reaction of the patient, or of later complication, without mention of misadventure at the time of the procedure: Secondary | ICD-10-CM | POA: Insufficient documentation

## 2015-06-16 DIAGNOSIS — T8789 Other complications of amputation stump: Secondary | ICD-10-CM | POA: Insufficient documentation

## 2015-06-16 DIAGNOSIS — E1122 Type 2 diabetes mellitus with diabetic chronic kidney disease: Secondary | ICD-10-CM | POA: Insufficient documentation

## 2015-06-16 DIAGNOSIS — E11622 Type 2 diabetes mellitus with other skin ulcer: Secondary | ICD-10-CM | POA: Insufficient documentation

## 2015-06-16 DIAGNOSIS — E1140 Type 2 diabetes mellitus with diabetic neuropathy, unspecified: Secondary | ICD-10-CM | POA: Insufficient documentation

## 2015-06-16 DIAGNOSIS — L97829 Non-pressure chronic ulcer of other part of left lower leg with unspecified severity: Secondary | ICD-10-CM | POA: Insufficient documentation

## 2015-06-16 DIAGNOSIS — B20 Human immunodeficiency virus [HIV] disease: Secondary | ICD-10-CM | POA: Insufficient documentation

## 2015-06-16 DIAGNOSIS — N186 End stage renal disease: Secondary | ICD-10-CM | POA: Insufficient documentation

## 2015-06-16 NOTE — Patient Instructions (Signed)
Please refer to progress note for details of today's visit 

## 2015-06-16 NOTE — Progress Notes (Signed)
Patient ID: Alvin Daniels, male   DOB: 01/28/1971, 45 y.o.   MRN: [id number] RN meet the patient at the clinic and performed a introduction visit with [facility]/Megan, [facility], Hep C specialist and [place] as a interpreter for the patient. Together we discussed my services and that I can make a home visit with the patient on Monday. RN spoke with the patient about his current regimen. Patient has not been taking his oral lamivudine and is still undetectable. RN conferenced with Dr Tommy Medal who stated the patient can take a low dose oral lamivudine at 100mg  once a day. Dr Tommy Medal placed the script at that time. RN arranged gave the patient the update and verified his address for delivery. RN instructed the patient on his current regimen and offered to fill a pill box for him if he would like. Alvin Daniels was interested and very pleased with the care he is receiving. Plan at this time is to meet the patient in his home on Monday(02/06) to see his set up, perform a initial home visit with signed consents and to assess any further needs that the patient may have.

## 2015-06-19 ENCOUNTER — Ambulatory Visit: Payer: Self-pay | Admitting: *Deleted

## 2015-06-19 DIAGNOSIS — B20 Human immunodeficiency virus [HIV] disease: Secondary | ICD-10-CM

## 2015-06-19 DIAGNOSIS — I959 Hypotension, unspecified: Secondary | ICD-10-CM

## 2015-06-19 DIAGNOSIS — B182 Chronic viral hepatitis C: Secondary | ICD-10-CM

## 2015-06-21 ENCOUNTER — Ambulatory Visit: Payer: Self-pay | Admitting: *Deleted

## 2015-06-21 DIAGNOSIS — B20 Human immunodeficiency virus [HIV] disease: Secondary | ICD-10-CM

## 2015-06-23 ENCOUNTER — Other Ambulatory Visit: Payer: Self-pay | Admitting: Internal Medicine

## 2015-06-23 ENCOUNTER — Ambulatory Visit (HOSPITAL_COMMUNITY)
Admission: RE | Admit: 2015-06-23 | Discharge: 2015-06-23 | Disposition: A | Discharge status: Home / Self Care | Payer: No Typology Code available for payment source | Source: Ambulatory Visit | Attending: Internal Medicine | Admitting: Internal Medicine

## 2015-06-23 DIAGNOSIS — M869 Osteomyelitis, unspecified: Secondary | ICD-10-CM

## 2015-06-23 DIAGNOSIS — L97129 Non-pressure chronic ulcer of left thigh with unspecified severity: Secondary | ICD-10-CM | POA: Insufficient documentation

## 2015-06-23 DIAGNOSIS — Z89519 Acquired absence of unspecified leg below knee: Secondary | ICD-10-CM | POA: Insufficient documentation

## 2015-06-26 NOTE — Patient Instructions (Signed)
Please refer to progress note for details of today's visit 

## 2015-06-26 NOTE — Progress Notes (Signed)
Patient ID: Alvin Daniels, male   DOB: October 17, 1970, 45 y.o.   MRN: [id number]  To recap on my last visit from 06/13/2015: RN instructed the patient on his current regimen and offered to fill a pill box for him if he would like. Alvin Daniels was interested and very pleased with the care he is receiving. Plan at this time is to meet the patient in his home on Monday(02/06) to see his set up, perform a initial home visit with signed consents and to assess any further needs that the patient may have.             Today RN arrived for our prearranged home visit. RN knocked on a locked door without a answer. After waiting at the door for several minutes, I returned to the car. RN contacted [employer] for a Administrator, sports. The Interpreter(Spanish) contacted the patient on his home number and stated she did not receive a answer, Interpreter left a message asking the patient to return my call. RN then disconnected that call and contacted the patient's cousin(Alvin Daniels) at the number provided by the patient. Alvin Daniels stated he did not know where the patient was and began to question why I was coming out to see the patient. Patient has already informed me that his family is not aware of his HIV status. RN informed Alvin Daniels that  I wanted to follow up with Alvin Daniels to recheck his Blood pressure.  [name] understood and stated his cousin continues to give out his phone number without telling him what is going on. Apologized to [name] and he stated he will have Alvin Daniels call me if he locates him

## 2015-06-27 NOTE — Telephone Encounter (Signed)
No answer, needs to bring [facility] application back to get Hep C med

## 2015-06-27 NOTE — Progress Notes (Signed)
Patient ID: Alvin Daniels, male   DOB: 01/04/1971, 45 y.o.   MRN: [id number] RN could meet the patient at [facility] for his 11:30 appt but decided against it. Spoke with [name] at [facility] who stated the patient came at 9:30 this morning and had a friend drop him off. They were unable to interpret the conversation and the conversation was not as productive as planned. RN to attempt to reach the patient again for a visit to discuss his medication adherence were interpreter services can be planned and provided through [facility]

## 2015-06-29 NOTE — Telephone Encounter (Signed)
Discussed needing signature to get free Hep C meds.  [id number] mailed applications to him.  He said he would return them to me.

## 2015-06-30 NOTE — Telephone Encounter (Signed)
He received the application to [facility] and stated he will sign it and return it to me.

## 2015-07-07 ENCOUNTER — Telehealth: Payer: Self-pay | Admitting: Pharmacy Technician

## 2015-07-07 NOTE — Telephone Encounter (Signed)
I used the Spanish interpreter and we left a message on his voicemail that I will mail the Hep C med out Monday, if I don't hear from him before then.  The phone number was given so that he can reach me.  [phone]

## 2015-07-14 ENCOUNTER — Other Ambulatory Visit: Payer: Self-pay | Admitting: Internal Medicine

## 2015-07-14 ENCOUNTER — Encounter (HOSPITAL_BASED_OUTPATIENT_CLINIC_OR_DEPARTMENT_OTHER): Payer: No Typology Code available for payment source | Attending: Internal Medicine

## 2015-07-14 DIAGNOSIS — Z89519 Acquired absence of unspecified leg below knee: Secondary | ICD-10-CM | POA: Insufficient documentation

## 2015-07-14 DIAGNOSIS — E1122 Type 2 diabetes mellitus with diabetic chronic kidney disease: Secondary | ICD-10-CM | POA: Insufficient documentation

## 2015-07-14 DIAGNOSIS — E11622 Type 2 diabetes mellitus with other skin ulcer: Secondary | ICD-10-CM | POA: Insufficient documentation

## 2015-07-14 DIAGNOSIS — L97229 Non-pressure chronic ulcer of left calf with unspecified severity: Secondary | ICD-10-CM | POA: Insufficient documentation

## 2015-07-14 DIAGNOSIS — N186 End stage renal disease: Secondary | ICD-10-CM | POA: Insufficient documentation

## 2015-07-14 DIAGNOSIS — B20 Human immunodeficiency virus [HIV] disease: Secondary | ICD-10-CM | POA: Insufficient documentation

## 2015-07-14 DIAGNOSIS — Z992 Dependence on renal dialysis: Secondary | ICD-10-CM | POA: Insufficient documentation

## 2015-08-18 ENCOUNTER — Encounter (HOSPITAL_BASED_OUTPATIENT_CLINIC_OR_DEPARTMENT_OTHER): Payer: No Typology Code available for payment source | Attending: Internal Medicine

## 2015-08-18 DIAGNOSIS — N186 End stage renal disease: Secondary | ICD-10-CM | POA: Insufficient documentation

## 2015-08-18 DIAGNOSIS — T8189XA Other complications of procedures, not elsewhere classified, initial encounter: Secondary | ICD-10-CM | POA: Insufficient documentation

## 2015-08-18 DIAGNOSIS — Y835 Amputation of limb(s) as the cause of abnormal reaction of the patient, or of later complication, without mention of misadventure at the time of the procedure: Secondary | ICD-10-CM | POA: Insufficient documentation

## 2015-08-18 DIAGNOSIS — Z992 Dependence on renal dialysis: Secondary | ICD-10-CM | POA: Insufficient documentation

## 2015-08-18 DIAGNOSIS — Z89512 Acquired absence of left leg below knee: Secondary | ICD-10-CM | POA: Insufficient documentation

## 2015-08-18 DIAGNOSIS — B20 Human immunodeficiency virus [HIV] disease: Secondary | ICD-10-CM | POA: Insufficient documentation

## 2015-08-18 DIAGNOSIS — E1122 Type 2 diabetes mellitus with diabetic chronic kidney disease: Secondary | ICD-10-CM | POA: Insufficient documentation

## 2015-08-18 DIAGNOSIS — B1920 Unspecified viral hepatitis C without hepatic coma: Secondary | ICD-10-CM | POA: Insufficient documentation

## 2015-09-06 ENCOUNTER — Other Ambulatory Visit: Payer: No Typology Code available for payment source

## 2015-09-14 ENCOUNTER — Other Ambulatory Visit: Payer: Self-pay | Admitting: Infectious Diseases

## 2015-09-14 DIAGNOSIS — K219 Gastro-esophageal reflux disease without esophagitis: Secondary | ICD-10-CM

## 2015-09-15 ENCOUNTER — Encounter (HOSPITAL_BASED_OUTPATIENT_CLINIC_OR_DEPARTMENT_OTHER): Payer: No Typology Code available for payment source | Attending: Internal Medicine

## 2015-09-15 DIAGNOSIS — B1920 Unspecified viral hepatitis C without hepatic coma: Secondary | ICD-10-CM | POA: Insufficient documentation

## 2015-09-15 DIAGNOSIS — B20 Human immunodeficiency virus [HIV] disease: Secondary | ICD-10-CM | POA: Insufficient documentation

## 2015-09-15 DIAGNOSIS — E1122 Type 2 diabetes mellitus with diabetic chronic kidney disease: Secondary | ICD-10-CM | POA: Insufficient documentation

## 2015-09-15 DIAGNOSIS — N186 End stage renal disease: Secondary | ICD-10-CM | POA: Insufficient documentation

## 2015-09-15 DIAGNOSIS — Z992 Dependence on renal dialysis: Secondary | ICD-10-CM | POA: Insufficient documentation

## 2015-09-15 DIAGNOSIS — I120 Hypertensive chronic kidney disease with stage 5 chronic kidney disease or end stage renal disease: Secondary | ICD-10-CM | POA: Insufficient documentation

## 2015-09-15 DIAGNOSIS — Z89519 Acquired absence of unspecified leg below knee: Secondary | ICD-10-CM | POA: Insufficient documentation

## 2015-09-15 DIAGNOSIS — Z8631 Personal history of diabetic foot ulcer: Secondary | ICD-10-CM | POA: Insufficient documentation

## 2015-09-18 ENCOUNTER — Encounter: Payer: Self-pay | Admitting: Internal Medicine

## 2015-09-19 ENCOUNTER — Ambulatory Visit: Payer: No Typology Code available for payment source | Admitting: Internal Medicine

## 2015-10-18 ENCOUNTER — Other Ambulatory Visit: Payer: Self-pay | Admitting: Infectious Disease

## 2015-10-18 DIAGNOSIS — B20 Human immunodeficiency virus [HIV] disease: Secondary | ICD-10-CM

## 2015-10-19 ENCOUNTER — Telehealth: Payer: Self-pay | Admitting: *Deleted

## 2015-10-19 ENCOUNTER — Other Ambulatory Visit (INDEPENDENT_AMBULATORY_CARE_PROVIDER_SITE_OTHER): Payer: No Typology Code available for payment source

## 2015-10-19 ENCOUNTER — Encounter (HOSPITAL_COMMUNITY): Payer: Self-pay

## 2015-10-19 ENCOUNTER — Emergency Department (HOSPITAL_COMMUNITY)
Admission: EM | Admit: 2015-10-19 | Discharge: 2015-10-19 | Disposition: A | Discharge status: Home / Self Care | Payer: No Typology Code available for payment source | Attending: Emergency Medicine | Admitting: Emergency Medicine

## 2015-10-19 DIAGNOSIS — E162 Hypoglycemia, unspecified: Secondary | ICD-10-CM

## 2015-10-19 DIAGNOSIS — E11649 Type 2 diabetes mellitus with hypoglycemia without coma: Secondary | ICD-10-CM | POA: Insufficient documentation

## 2015-10-19 DIAGNOSIS — Z872 Personal history of diseases of the skin and subcutaneous tissue: Secondary | ICD-10-CM | POA: Insufficient documentation

## 2015-10-19 DIAGNOSIS — I951 Orthostatic hypotension: Secondary | ICD-10-CM

## 2015-10-19 DIAGNOSIS — Z8614 Personal history of Methicillin resistant Staphylococcus aureus infection: Secondary | ICD-10-CM | POA: Insufficient documentation

## 2015-10-19 DIAGNOSIS — E1122 Type 2 diabetes mellitus with diabetic chronic kidney disease: Secondary | ICD-10-CM

## 2015-10-19 DIAGNOSIS — Z79899 Other long term (current) drug therapy: Secondary | ICD-10-CM | POA: Insufficient documentation

## 2015-10-19 DIAGNOSIS — N186 End stage renal disease: Principal | ICD-10-CM

## 2015-10-19 DIAGNOSIS — Z792 Long term (current) use of antibiotics: Secondary | ICD-10-CM | POA: Insufficient documentation

## 2015-10-19 DIAGNOSIS — B20 Human immunodeficiency virus [HIV] disease: Secondary | ICD-10-CM

## 2015-10-19 DIAGNOSIS — Z8719 Personal history of other diseases of the digestive system: Secondary | ICD-10-CM | POA: Insufficient documentation

## 2015-10-19 DIAGNOSIS — E1140 Type 2 diabetes mellitus with diabetic neuropathy, unspecified: Secondary | ICD-10-CM | POA: Insufficient documentation

## 2015-10-19 DIAGNOSIS — Z862 Personal history of diseases of the blood and blood-forming organs and certain disorders involving the immune mechanism: Secondary | ICD-10-CM | POA: Insufficient documentation

## 2015-10-19 DIAGNOSIS — Z89512 Acquired absence of left leg below knee: Secondary | ICD-10-CM

## 2015-10-19 DIAGNOSIS — Z992 Dependence on renal dialysis: Secondary | ICD-10-CM | POA: Insufficient documentation

## 2015-10-19 DIAGNOSIS — Z87448 Personal history of other diseases of urinary system: Secondary | ICD-10-CM | POA: Insufficient documentation

## 2015-10-19 DIAGNOSIS — E43 Unspecified severe protein-calorie malnutrition: Secondary | ICD-10-CM

## 2015-10-19 LAB — COMPLETE METABOLIC PANEL WITH GFR
ALBUMIN: 4 g/dL (ref 3.6–5.1)
ALK PHOS: 123 U/L — AB (ref 40–115)
ALT: 11 U/L (ref 9–46)
AST: 26 U/L (ref 10–40)
BUN: 9 mg/dL (ref 7–25)
CALCIUM: 8.2 mg/dL — AB (ref 8.6–10.3)
CO2: 34 mmol/L — ABNORMAL HIGH (ref 20–31)
Chloride: 96 mmol/L — ABNORMAL LOW (ref 98–110)
Creat: 4.44 mg/dL — ABNORMAL HIGH (ref 0.60–1.35)
GFR, EST AFRICAN AMERICAN: 17 mL/min — AB (ref >=60)
GFR, EST NON AFRICAN AMERICAN: 15 mL/min — AB (ref >=60)
Glucose, Bld: 32 mg/dL — CL (ref 65–99)
POTASSIUM: 4.1 mmol/L (ref 3.5–5.3)
Sodium: 136 mmol/L (ref 135–146)
Total Bilirubin: 0.4 mg/dL (ref 0.2–1.2)
Total Protein: 8.2 g/dL — ABNORMAL HIGH (ref 6.1–8.1)

## 2015-10-19 LAB — CBC WITH DIFFERENTIAL/PLATELET
Basophils Absolute: 51 cells/uL (ref 0–200)
Basophils Relative: 1 %
EOS PCT: 15 %
Eosinophils Absolute: 765 cells/uL — ABNORMAL HIGH (ref 15–500)
HCT: 38.1 % — ABNORMAL LOW (ref 38.5–50.0)
HEMOGLOBIN: 12.7 g/dL — AB (ref 13.2–17.1)
LYMPHS ABS: 1428 {cells}/uL (ref 850–3900)
Lymphocytes Relative: 28 %
MCH: 34.4 pg — ABNORMAL HIGH (ref 27.0–33.0)
MCHC: 33.3 g/dL (ref 32.0–36.0)
MCV: 103.3 fL — ABNORMAL HIGH (ref 80.0–100.0)
MPV: 9.1 fL (ref 7.5–12.5)
Monocytes Absolute: 561 cells/uL (ref 200–950)
Monocytes Relative: 11 %
NEUTROS PCT: 45 %
Neutro Abs: 2295 cells/uL (ref 1500–7800)
Platelets: 190 10*3/uL (ref 140–400)
RBC: 3.69 MIL/uL — AB (ref 4.20–5.80)
RDW: 13.8 % (ref 11.0–15.0)
WBC: 5.1 10*3/uL (ref 3.8–10.8)

## 2015-10-19 LAB — CBG MONITORING, ED
GLUCOSE-CAPILLARY: 99 mg/dL (ref 65–99)
GLUCOSE-CAPILLARY: 313 mg/dL — AB (ref 65–99)

## 2015-10-19 LAB — GLUCOSE, CAPILLARY
GLUCOSE-CAPILLARY: 19 mg/dL — AB (ref 65–99)
GLUCOSE-CAPILLARY: 22 mg/dL — AB (ref 65–99)

## 2015-10-19 LAB — HEMOGLOBIN A1C
Hgb A1c MFr Bld: 5.6 % (ref <5.7)
Mean Plasma Glucose: 114 mg/dL

## 2015-10-19 NOTE — Telephone Encounter (Signed)
Patient in office to have labs drawn, see his case manager, sign up for [facility], document his orange card, get food from the pantry.  Patient was standing up after signing paperwork with Beryle Lathe (Financial Aid) when he dropped to the floor.  Nursing called for assessment of emergency situation. 911 call initiated. Patient's responsiveness waxing/waning, sweating profusely. BP 140s/80s, pulse >60, O2 100.  Patient unable to answer questions, teeth clenched. Glucometer needed to be set up before blood sugar could be tested (quality control test for high and low, registered to user, registered to patient).  Harvin Hazel, RN and Orland Mustard, RN initiated hypoglycemia protocol without point of care glucose based on assessment, patient's health history, and delay in getting the glucometer registered. Patient given 15 grams glucose orally without significant improvement. 2nd dose of 15 grams glucose given orally, patient began to speak in Spanish (native language). Point of care glucose = 22.  3rd dose of 15 grams glucose given orally.  Dr. Baxter Flattery on scene, 911 EMS on scene.  Peripheral IV started by EMS. Repeat point of care glucose = 19. Patient lifted to stretcher, EMS took over care. Landis Gandy, RN

## 2015-10-19 NOTE — ED Notes (Signed)
[facility]- Pt here from PCP office after he was found unresponsive. Pt reported that he took his insulin at 1500 and did not eat, CBG was 19 on EMS arrival, given 12.5 of D50. Pt is alert and oriented on arrival. CBG up to 120 with EMS. Pt is a dialysis pt and was dialyzed today.

## 2015-10-19 NOTE — ED Notes (Signed)
Gave pt a [place] sandwich per [employer]- Therapist, sports.

## 2015-10-19 NOTE — Discharge Instructions (Signed)
Asegrese de comer despus de tomar su insulina.  Hipoglucemia (Hypoglycemia) La hipoglucemia se produce cuando el nivel de glucosa en la sangre es demasiado bajo. La glucosa es un tipo de azcar, que es la principal fuente de energa del cuerpo. Hormonas, como la insulina y Secretary/administrator, Probation officer el nivel de glucosa en la [place]. La insulina reduce el nivel de la glucosa en la sangre, mientras que el glucagn lo [place]. Si tiene demasiada insulina en el torrente sanguneo o si no ingiere suficientes alimentos que contengan azcar, puede desarrollar hipoglucemia. Esta afeccin puede manifestarse en personas con o sin diabetes. Puede desarrollarse rpidamente y, como consecuencia, necesitar atencin urgente.  CAUSAS   Omitir o retrasar comidas.  No ingerir demasiados carbohidratos en las comidas.  Consumo excesivo de medicamentos para la diabetes.  No coordinar el horario de la toma de medicamentos por va oral para la diabetes o de insulina, con las comidas, las colaciones y Adult nurse.  Nuseas y vmitos.  Algunos medicamentos.  Enfermedades graves, como hepatitis, trastornos renales y ciertos trastornos de Youth worker.  Aumento de la actividad fsica o el ejercicio, sin ingerir alimentos adicionales o ajustar los medicamentos.  Beber alcohol en exceso.  Un trastorno nervioso que afecta las funciones corporales, como la frecuencia cardaca, presin arterial y digestin (neuropata [place]).  Una afeccin en la cual los msculos del estmago no funcionan apropiadamente (gastroparesia). Por consiguiente, los medicamentos y los alimentos no pueden absorberse Personal assistant.  Pocas veces un tumor de pncreas puede producir demasiada insulina. SNTOMAS   Hambre.  Sudoraciones (diaforesis).  Cambio en la Firefighter.  Temblores.  Dolor de [place].  Ansiedad.  Aturdimiento.  Irritabilidad.  Dificultad para concentrarse.  Sequedad en la boca.  Hormigueo o  adormecimiento de las manos y los pies.  Sueo agitado o alteraciones del sueo.  Alteracin en el habla y la coordinacin.  Cambio en el estado mental.  Convulsiones breves o prolongadas.  Agresividad  Somnolencia (letargo).  Debilidad.  Aumento de la frecuencia cardaca o palpitaciones.  Confusin.  Piel plida o de [employer].  Visin borrosa o doble.  Desmayos. DIAGNSTICO  Le harn un examen fsico y [place] historia clnica. Su mdico puede hacer un diagnstico en funcin de sus sntomas. Pueden realizarle anlisis de sangre y otras pruebas de laboratorio para Physicist, medical diagnstico. Una vez realizado el diagnstico, su mdico observar si los signos y sntomas desaparecen, una vez que aumenta el nivel de la glucosa en la [place].  TRATAMIENTO  Por lo general, la hipoglucemia puede tratarse fcilmente cuando se observan sntomas.  Controle su nivel de glucosa en la sangre. Si es menor que 70 mg/dl, tome uno de los siguientes:  3 o 4 comprimidos de glucosa.   taza de jugo.   taza de una gaseosa comn.  [place]   a 1 pomo de glucosa en gel.  5 a 6 caramelos duros.  Evite las bebidas o los alimentos con alto contenido de grasa, que pueden retrasar el aumento de los niveles de glucosa en la [place].  No ingiera ms de la cantidad recomendada de alimentos, bebidas, gel o comprimidos que contengan azcar. Si lo hace, el nivel de glucosa en la sangre subir demasiado.  Espere de 10 a 15 minutos y vuelva a Chief Technology Officer su nivel de glucosa en la sangre. Si an es Scientist, product/process development 70 mg/dl o est por debajo del intervalo indicado, repita el tratamiento.  Ingiera una colacin si falta ms de 1 hora para  su prxima comida. Es posible que, alguna vez, su nivel de glucosa en la sangre baje [place], de modo que no pueda tratarse en su casa, cuando comience a observar los sntomas. Probablemente necesite ayuda. Incluso puede desmayarse o ser incapaz de tragar. Si no  puede tratarse por s solo, alguien Software engineer al hospital.  INSTRUCCIONES PARA EL CUIDADO EN EL HOGAR  Si tiene diabetes, siga su plan de control de la diabetes:  Tome los medicamentos segn las indicaciones.  Siga el plan de ejercicio.  Siga el plan de comidas. No saltee comidas. Coma a horario.  Controle su nivel de glucosa en la sangre peridicamente. Controle su nivel de glucosa en la sangre antes y despus de ejercitarse. Si hace ejercicio durante ms tiempo o de [employer] de lo habitual, asegrese de Chief Technology Officer su nivel de glucosa en la sangre con mayor frecuencia.  Use su pulsera o medalla de alerta mdica, que indica que usted tiene diabetes.  Identifique la causa de su hipoglucemia. Luego, desarrolle formas de prevenir la recurrencia de la hipoglucemia.  No tome un bao o una ducha caliente inmediatamente despus de una inyeccin de insulina.  Siempre lleve [employer]. Las pastillas de glucosa son fciles de Catering manager.  Si va a beber alcohol, bbalo solo con las comidas.  Informe a familiares y amigos qu pueden hacer para mantenerlo seguro durante una convulsin. Esto puede incluir retirar [employer] duros o filosos del rea o colocarlo de costado.  Mantenga un peso saludable. SOLICITE ATENCIN MDICA SI:   Tiene problemas para Advertising account executive de glucosa en la sangre dentro del intervalo indicado.  Tiene episodios frecuentes de hipoglucemia.  Siente efectos secundarios por los medicamentos prescritos.  No est seguro por qu su nivel de glucosa en la sangre es tan bajo.  Nota cambios o un nuevo problema en la visin . SOLICITE ATENCIN MDICA DE INMEDIATO SI:   Presenta confusin.  Se produce un cambio en su estado mental.  Es incapaz de tragar.  Se desmaya.   Esta informacin no tiene Marine scientist el consejo del mdico. Asegrese de hacerle al mdico cualquier pregunta que tenga.   Document Released: 04/29/2005 Document Revised:  05/04/2013 Elsevier Interactive Patient Education [employer].

## 2015-10-19 NOTE — ED Notes (Signed)
CBG: 313

## 2015-10-19 NOTE — ED Notes (Signed)
Placed restricted band on pt's left wrist.

## 2015-10-19 NOTE — ED Provider Notes (Signed)
CSN: [id number]     Arrival date & time 10/19/15  1555 History   First MD Initiated Contact with Patient 10/19/15 1612     Chief Complaint  Patient presents with  . Hypoglycemia     (Consider location/radiation/quality/duration/timing/severity/associated sxs/prior Treatment) Patient is a 45 y.o. male presenting with hypoglycemia. The history is provided by the patient.  Hypoglycemia He was brought in after being found unresponsive and was found to have a blood sugar of 19. This came up to 120 with an injection of dextrose. He admits to taking his insulin today but did not eat. He is a dialysis patient and had dialysis today. He denies any abdominal pain, nausea, vomiting. He denies fever or chills.  Past Medical History  Diagnosis Date  . Hepatitis C   . MRSA infection   . Pancreatitis   . Chronic diarrhea   . HIV INFECTION 06/27/2010    Qualifier: Diagnosis of  By: Nickola Major CMA ( [place]), Geni Bers    . Hypotension 06/02/2012  . Metabolic bone disease [id number]  . Normocytic anemia 06/17/2012  . Severe protein-calorie malnutrition ([place]) 06/17/2012  . Diabetic neuropathy ([place])   . ESRD (end stage renal disease) on dialysis [facility])     "TTS; don't remember street name" (05/03/2014)  . Renal disorder   . Uncontrolled diabetes mellitus with complications ([place]) 0000000    Annotation: uncontrolled Qualifier: Diagnosis of  By: Nickola Major CMA ( [place]), Geni Bers    . Pressure ulcer of BKA stump, stage 2 ([place]) 11/22/2014  . Chronic hepatitis C without hepatic coma ([place]) 11/22/2014  . AIDS ([place]) 11/22/2014   Past Surgical History  Procedure Laterality Date  . Av fistula placement Left   . Amputation Left 04/20/2014    Procedure: 3rd toe amputation, 4th Toe Amputation,  5th Toe Amputation;  Surgeon: Newt Minion, MD;  Location: [place];  Service: Orthopedics;  Laterality: Left;  . 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 Amputation Left 05/02/2014    Procedure: Midfoot  Amputation;  Surgeon: Newt Minion, MD;  Location: [place];  Service: Orthopedics;  Laterality: Left;  . Amputation Left 06/17/2014    Procedure: AMPUTATION BELOW KNEE;  Surgeon: Newt Minion, MD;  Location: [place];  Service: Orthopedics;  Laterality: Left;   Family History  Problem Relation Age of Onset  . Diabetes Mother   . Diabetes Father    Social History  Substance Use Topics  . Smoking status: Never Smoker   . Smokeless tobacco: Never Used  . Alcohol Use: No    Review of Systems  All other systems reviewed and are negative.     Allergies  Review of patient's allergies indicates no known allergies.  Home Medications   Prior to Admission medications   Medication Sig Start Date End Date Taking? Authorizing Provider  Elbasvir-Grazoprevir (ZEPATIER) 50-100 MG TABS Take 1 tablet by mouth daily. 06/07/15   Truman Hayward, MD  insulin lispro protamine-lispro (HUMALOG MIX 75/25) (75-25) 100 UNIT/ML SUSP injection Inject 5 Units into the skin 2 (two) times daily with a meal. 15 units in the am and 15 units in the pm Patient taking differently: Inject 5 Units into the skin daily.  07/29/14   [name], MD  lamivudine (EPIVIR) 100 MG tablet Take 1 tablet (100 mg total) by mouth daily. 06/13/15   Truman Hayward, MD  methocarbamol (ROBAXIN) 500 MG tablet Take 1 tablet (500 mg total) by mouth every 6 (six)  hours as needed for muscle spasms. 04/22/14   Vivi Barrack, MD  midodrine (PROAMATINE) 10 MG tablet Take 1 tablet (10 mg total) by mouth 3 (three) times daily with meals. 06/07/15   Truman Hayward, MD  omeprazole (PRILOSEC) 40 MG capsule TAKE 1 CAPSULE BY MOUTH EVERY DAY 09/14/15   Campbell Riches, MD  ondansetron (ZOFRAN) 4 MG tablet Take 1 tablet (4 mg total) by mouth every 6 (six) hours as needed for nausea. Patient not taking: Reported on 05/02/2014 04/22/14   Vivi Barrack, MD  polyethylene glycol [facility] / Floria Raveling) packet Take 17 g by mouth daily as needed for  moderate constipation or severe constipation. 04/22/14   Vivi Barrack, MD  sulfamethoxazole-trimethoprim (BACTRIM,SEPTRA) 400-80 MG tablet TAKE 1 TABLET BY MOUTH DAILY 05/25/15   Truman Hayward, MD  tenofovir (VIREAD) 300 MG tablet TAKE 1 TABLET BY MOUTH EVERY WEEK ON SUNDAYS 04/18/15   Truman Hayward, MD  TIVICAY 50 MG tablet TAKE 1 TABLET BY MOUTH DAILY 10/18/15   Truman Hayward, MD   BP 113/71 mmHg  Pulse 89  Resp 14  SpO2 100% Physical Exam  Nursing note and vitals reviewed.  45 year old male, resting comfortably and in no acute distress. Vital signs are normal. Oxygen saturation is 100%, which is normal. Head is normocephalic and atraumatic. PERRLA, EOMI. Oropharynx is clear. Neck is nontender and supple without adenopathy or JVD. Back is nontender and there is no CVA tenderness. Lungs are clear without rales, wheezes, or rhonchi. Chest is nontender. Heart has regular rate and rhythm without murmur. Abdomen is soft, flat, nontender without masses or hepatosplenomegaly and peristalsis is normoactive. Extremities have no cyanosis or edema, full range of motion is present. AV fistula is present in the left forearm with thrill present.Status post amputation of left third, fourth, fifth toes. Skin is warm and dry without rash. Neurologic: Mental status is normal, cranial nerves are intact, there are no motor or sensory deficits.  ED Course  Procedures (including critical care time) Labs Review Results for orders placed or performed during the hospital encounter of 10/19/15  CBG monitoring, ED  Result Value Ref Range   Glucose-Capillary 99 65 - 99 mg/dL  POC CBG, ED  Result Value Ref Range   Glucose-Capillary 313 (H) 65 - 99 mg/dL   I have personally reviewed and evaluated these images and lab results as part of my medical decision-making.   MDM   Final diagnoses:  Hypoglycemia  End-stage renal disease on hemodialysis ([place])    Hypoglycemia secondary to missing  a meal. He will be given a meal in the ED and observed.Old records are reviewed and he does have visits related to diabetes, but no recent episodes of hypoglycemia.  He has not had any recurrent hypoglycemia after observation in the ED. He is discharged with instructions to make sure that he eats every time that he takes his insulin.  Delora Fuel, MD [id number] [phone]

## 2015-10-19 NOTE — Telephone Encounter (Signed)
Pt hospitalized today after collapsing at [facility].  Hypoglcemia, hypotension.  Needing follow-up after discharge to assess pt in the home.  Please visit patient while hospitalized to introduce self and discuss need for home visiting.  You should be able to ask one of the nurses or the unit secretary to call for an interpreter.  Please ask one of [place] to give you the information for the telephone interpreter service that the hospital uses, [employer].  I'll leave a copy on Triage for you

## 2015-10-20 ENCOUNTER — Telehealth: Payer: Self-pay | Admitting: *Deleted

## 2015-10-20 LAB — HIV-1 RNA QUANT-NO REFLEX-BLD

## 2015-10-20 LAB — T-HELPER CELL (CD4) - (RCID CLINIC ONLY)
CD4 T CELL ABS: 280 /uL — AB (ref 400–2700)
CD4 T CELL HELPER: 18 % — AB (ref 33–55)

## 2015-10-20 NOTE — Telephone Encounter (Signed)
-----   Message from Campbell Riches, MD sent at 10/20/2015 11:44 AM EDT ----- Can someone call Mr Derogatis and get a repeat BMP on him ASAP Thanks  ----- Message -----    From: Lab in [facility] Interface    Sent: 10/19/2015   9:34 PM      To: Campbell Riches, MD

## 2015-10-20 NOTE — Telephone Encounter (Signed)
Called the patient to have him come in on Monday 10/23/15 for a repeat BMP. Had to speak with someone else in the home as the patient does not speak english. She advised will try to have him here for lab on Monday afternoon.

## 2015-10-20 NOTE — Telephone Encounter (Signed)
Received call from [facility] of the patient critical low glucose of 32 10/19/15. Patient already addressed and sent to the hospital from office 10/19/15.

## 2015-10-20 NOTE — Telephone Encounter (Signed)
Thank you guys. I am worried about his overall non HIV health and how well this is managed. I hope he is OK

## 2015-10-20 NOTE — Telephone Encounter (Signed)
I checked the chart and it appears that he was never admitted. The only documentation I can see from the ED is that his BG was over 300 and they gave him a [place] sandwich. I will try and again to reconnect with him for a home visit because something is going on.

## 2015-10-22 NOTE — Telephone Encounter (Signed)
He needs to be further evaluated. Does he have a PCP?

## 2015-10-23 NOTE — Telephone Encounter (Addendum)
Dr. Baxter Flattery thought that the patient would be admitted to the hospital after his problem here at [place] then transferred to ED.  Meagan, [facility] Case Manager, is attempting to get him to [facility] for PCP.  The patient is Spanish-speaking and has been hesitant to even leave his apartment to come to [facility] for appointments.  [facility] will continue to work with him.  Left phone message for Roosevelt Locks, [place] Case Manager, to attempt to work with the patient to establish PCP at [facility].  Dr. [name] had asked that the patient have a repeat BMP today but the pt did not come for the lab work.  Kinnie Scales, RN, Community Nurse, has also been referred to participate in the patient's care.

## 2015-10-24 NOTE — Telephone Encounter (Signed)
I would have thought that as well re that he would have been admitted. From my recollection he takes good care of his HIV but not his diabetes and he has fairly significant problems with  orthostatic hypotension

## 2015-11-07 ENCOUNTER — Other Ambulatory Visit: Payer: Self-pay | Admitting: Infectious Disease

## 2015-11-10 ENCOUNTER — Telehealth: Payer: Self-pay | Admitting: *Deleted

## 2015-11-10 NOTE — Telephone Encounter (Signed)
Thank you Ambre!

## 2015-11-10 NOTE — Telephone Encounter (Signed)
RN received a referral for [place]. RN contacted through Interpreter services without a answer. Voicemessage was left giving the patient my name and return phone number

## 2015-11-15 ENCOUNTER — Other Ambulatory Visit: Payer: Self-pay | Admitting: Infectious Disease

## 2015-11-20 ENCOUNTER — Encounter: Payer: Self-pay | Admitting: *Deleted

## 2015-11-22 ENCOUNTER — Encounter: Payer: Self-pay | Admitting: *Deleted

## 2015-12-13 ENCOUNTER — Ambulatory Visit: Payer: No Typology Code available for payment source | Attending: Family Medicine | Admitting: Family Medicine

## 2015-12-13 ENCOUNTER — Encounter: Payer: Self-pay | Admitting: Internal Medicine

## 2015-12-13 ENCOUNTER — Encounter: Payer: Self-pay | Admitting: Family Medicine

## 2015-12-13 VITALS — BP 91/53 | HR 93 | Temp 97.8°F | Ht 61.0 in | Wt 120.0 lb

## 2015-12-13 DIAGNOSIS — B20 Human immunodeficiency virus [HIV] disease: Secondary | ICD-10-CM | POA: Insufficient documentation

## 2015-12-13 DIAGNOSIS — N186 End stage renal disease: Secondary | ICD-10-CM

## 2015-12-13 DIAGNOSIS — E119 Type 2 diabetes mellitus without complications: Secondary | ICD-10-CM | POA: Insufficient documentation

## 2015-12-13 DIAGNOSIS — Z89512 Acquired absence of left leg below knee: Secondary | ICD-10-CM

## 2015-12-13 DIAGNOSIS — Z992 Dependence on renal dialysis: Secondary | ICD-10-CM

## 2015-12-13 DIAGNOSIS — E1122 Type 2 diabetes mellitus with diabetic chronic kidney disease: Secondary | ICD-10-CM

## 2015-12-13 DIAGNOSIS — Z9889 Other specified postprocedural states: Secondary | ICD-10-CM | POA: Insufficient documentation

## 2015-12-13 LAB — GLUCOSE, POCT (MANUAL RESULT ENTRY): POC GLUCOSE: 76 mg/dL (ref 70–99)

## 2015-12-13 NOTE — Progress Notes (Signed)
DM follow up Just completed course of antibiotic for infection of right foot

## 2015-12-13 NOTE — Patient Instructions (Signed)
La diabetes mellitus y los alimentos (Diabetes Mellitus and Food) Es importante que controle su nivel de azcar en la sangre (glucosa). El nivel de glucosa en sangre depende en gran medida de lo que usted come. [place]er alimentos saludables en las cantidades [place] a lo largo del Training and development officer, aproximadamente a la misma hora [employer], lo ayudar a Chief Technology Officer su nivel de Multimedia programmer. Tambin puede ayudarlo a retrasar o Patent attorney de la diabetes mellitus. Comer de [employer] saludable incluso puede ayudarlo a Chartered loss adjuster de presin arterial y a Science writer o Theatre manager un peso saludable.  Entre las recomendaciones generales para alimentarse y Audiological scientist los alimentos de forma saludable, se incluyen las siguientes:  Respetar las comidas principales y comer colaciones con regularidad. Evitar pasar largos perodos sin comer con el fin de perder peso.  Seguir una dieta que consista principalmente en alimentos de origen vegetal, como frutas, vegetales, frutos secos, legumbres y cereales integrales.  Utilizar mtodos de coccin a baja temperatura, como hornear, en lugar de mtodos de coccin a alta temperatura, como frer en abundante aceite. Trabaje con el nutricionista para aprender a Financial planner nutricional de las etiquetas de los alimentos. CMO PUEDEN AFECTARME LOS ALIMENTOS? Carbohidratos Los carbohidratos afectan el nivel de glucosa en sangre ms que cualquier otro tipo de alimento. El nutricionista lo ayudar a Teacher, adult education cuntos carbohidratos puede consumir en cada comida y ensearle a contarlos. El recuento de carbohidratos es importante para mantener la glucosa en sangre en un nivel saludable, en especial si utiliza insulina o toma determinados medicamentos para la diabetes mellitus. Alcohol El alcohol puede provocar disminuciones sbitas de la glucosa en sangre (hipoglucemia), en especial si utiliza insulina o toma determinados medicamentos para la diabetes mellitus. La  hipoglucemia es una afeccin que puede poner en peligro la vida. Los sntomas de la hipoglucemia (somnolencia, mareos y Data processing manager) son similares a los sntomas de haber consumido mucho alcohol.  Si el mdico lo autoriza a beber alcohol, hgalo con moderacin y siga estas pautas:  Las mujeres no deben beber ms de un trago por da, y los hombres no deben beber ms de dos tragos por Training and development officer. Un trago es igual a:  12 onzas (355 ml) de cerveza  5 onzas de vino (150 ml) de vino  1,5onzas (23m) de bebidas espirituosas  No beba con el estmago vaco.  Mantngase hidratado. Beba agua, gaseosas dietticas o t helado sin azcar.  Las gaseosas comunes, los jugos y otros refrescos podran contener muchos carbohidratos y se dCivil Service fast streamer QU ALIMENTOS NO SE RECOMIENDAN? Cuando haga las elecciones de alimentos, es importante que recuerde que todos los alimentos son distintos. Algunos tienen menos nutrientes que otros por porcin, aunque podran tener la misma cantidad de caloras o carbohidratos. Es difcil darle al cuerpo lo que necesita cuando consume alimentos con menos nutrientes. Estos son algunos ejemplos de alimentos que debera evitar ya que contienen muchas caloras y carbohidratos, pero pocos nutrientes:  GPhysicist, medicaltrans (la mayora de los alimentos procesados incluyen grasas trans en la etiqueta de Informacin nutricional).  Gaseosas comunes.  Jugos.  Caramelos.  Dulces, como tortas, pasteles, rosquillas y g[place]  Comidas fritas. QU ALIMENTOS PUEDO COMER? Consuma alimentos ricos en nutrientes, que nutrirn el cuerpo y lo mantendrn saludable. Los alimentos que debe comer tambin dependern de varios factores, como:  Las caloras que necesita.  Los medicamentos que toma.  Su peso.  El nivel de glucosa en s[employer]ist College  El nArrow Rockde presin arterial.  El nivel de colesterol.  Debe consumir una amplia variedad de alimentos, por ejemplo:  Protenas.  Cortes de [employer].  Protenas con bajo contenido de grasas saturadas, como pescado, clara de huevo y frijoles. Evite las carnes procesadas.  Frutas y vegetales.  Frutas y Photographer que pueden ayudar a Chief Technology Officer los niveles sanguneos de [place], como [place], mangos y batatas.  Productos lcteos.  Elija productos lcteos sin grasa o con bajo contenido de [place], como [place], yogur y [place].  Cereales, panes, pastas y arroz.  Elija cereales integrales, como panes multicereales, avena en grano y arroz integral. Estos alimentos pueden ayudar a controlar la presin arterial.  Daphene Jaeger.  Alimentos que contengan grasas saludables, como frutos secos, Musician, aceite de [place], aceite de canola y pescado. TODOS LOS QUE PADECEN DIABETES MELLITUS TIENEN EL [place] PLAN DE [place]? Dado que todas las personas que padecen diabetes mellitus son distintas, no hay un solo plan de comidas que funcione para todos. Es muy importante que se rena con un nutricionista que lo ayudar a crear un plan de comidas adecuado para usted.   Esta informacin no tiene Marine scientist el consejo del mdico. Asegrese de hacerle al mdico cualquier pregunta que tenga.   Document Released: 08/06/2007 Document Revised: 05/20/2014 Elsevier Interactive Patient Education [employer].

## 2015-12-13 NOTE — Progress Notes (Signed)
Subjective:    Patient ID: Alvin Daniels, male    DOB: March 16, 1971, 45 y.o.   MRN: [id number]  HPI 45 year old with a history of type 2 diabetes mellitus (A1c 5.6), left BKA (in 06/2014) HIV, hepatitis C, end-stage renal disease on hemodialysis (Tuesday Thursday and Saturday) who comes into the clinic for follow-up visit.  He is requesting a prescription for left leg prosthesis. Denies any complaint with his stump and ambulates with the aid of a walker.  He has been compliant with his dialysis schedule and also sees infectious disease for management of his HIV. He had informed the nurse he was not taking his antiretroviral medications but informs me he has been compliant.  Past Medical History:  Diagnosis Date  . AIDS ([place]) 11/22/2014  . Chronic diarrhea   . Chronic hepatitis C without hepatic coma ([place]) 11/22/2014  . Diabetic neuropathy ([place])   . ESRD (end stage renal disease) on dialysis [facility])    "TTS; don't remember street name" (05/03/2014)  . Hepatitis C   . HIV INFECTION 06/27/2010   Qualifier: Diagnosis of  By: Nickola Major CMA ( [place]), Geni Bers    . Hypotension 06/02/2012  . Metabolic bone disease [id number]  . MRSA infection   . Normocytic anemia 06/17/2012  . Pancreatitis   . Pressure ulcer of BKA stump, stage 2 ([place]) 11/22/2014  . Renal disorder   . Severe protein-calorie malnutrition ([place]) 06/17/2012  . Uncontrolled diabetes mellitus with complications ([place]) 0000000   Annotation: uncontrolled Qualifier: Diagnosis of  By: Nickola Major CMA Deborra Medina), Geni Bers      Past Surgical History:  Procedure Laterality Date  . AMPUTATION Left 04/20/2014   Procedure: 3rd toe amputation, 4th Toe Amputation,  5th Toe Amputation;  Surgeon: Newt Minion, MD;  Location: [name];  Service: Orthopedics;  Laterality: Left;  . AMPUTATION Left 05/02/2014   Procedure: Midfoot Amputation;  Surgeon: Newt Minion, MD;  Location: [facility];  Service: Orthopedics;  Laterality: Left;  . AMPUTATION Left  06/17/2014   Procedure: AMPUTATION BELOW KNEE;  Surgeon: Newt Minion, MD;  Location: [facility];  Service: Orthopedics;  Laterality: Left;  . AV FISTULA PLACEMENT Left   . FOOT AMPUTATION THROUGH ANKLE Left 12/'21/2015   midfoot      Review of Systems  Constitutional: Negative for activity change and appetite change.  HENT: Negative for sinus pressure and sore throat.   Eyes: Negative for visual disturbance.  Respiratory: Negative for cough, chest tightness and shortness of breath.   Cardiovascular: Negative for chest pain and leg swelling.  Gastrointestinal: Negative for abdominal distention, abdominal pain, constipation and diarrhea.  Endocrine: Negative.   Genitourinary: Negative for dysuria.  Musculoskeletal: Negative for joint swelling and myalgias.  Skin: Negative for rash.  Allergic/Immunologic: Negative.   Neurological: Negative for weakness, light-headedness and numbness.  Psychiatric/Behavioral: Negative for dysphoric mood and suicidal ideas.       Objective: Vitals:   12/13/15 1002  BP: (!) 91/53  Pulse: 93  Temp: 97.8 F (36.6 C)  TempSrc: Oral  SpO2: 99%  Weight: 120 lb (54.4 kg)  Height: 5\' 1"  (1.549 m)      Physical Exam General Appearance:    Alert, cooperative, no distress, appears stated age  Head:    Normocephalic, without obvious abnormality, atraumatic  Eyes:    PERRL, conjunctiva/corneas clear, EOM's intact, fundi    benign, both eyes       Ears:    Normal TM's and external ear canals, both  ears  Nose:   Nares normal, septum midline, mucosa normal, no drainage   or sinus tenderness  Throat:   Lips, mucosa, and tongue normal; teeth and gums normal   Neck:   Supple, symmetrical, trachea midline, no adenopathy;       thyroid:  No enlargement/tenderness/nodules; no carotid   bruit or JVD  Back:     Symmetric, no curvature, ROM normal, no CVA tenderness  Lungs:     Clear to auscultation bilaterally, respirations unlabored  Chest wall:    No tenderness  or deformity  Heart:    Regular rate and rhythm, S1 and S2 normal, no murmur, rub   or gallop  Abdomen:     Soft, non-tender, bowel sounds active all four quadrants,    no masses, no organomegaly        Extremities:   Extremity normal on the right, left BKA  Pulses:   1+ on right Dorsalis  Skin:  Left fore arm AV fistula, palpable thrill.   Lymph nodes:   Cervical, supraclavicular, and axillary nodes normal  Neurologic:   CNII-XII intact. Normal strength, sensation and reflexes      throughout         Assessment & Plan:  Diabetes Mellitus, Type 2 : Controlled With A1c of 5.6 and 10/2015 Discontinue insulin to prevent hypoglycemia. Right Foot exam performed today..  End Stage renal Dialysis Hemodialysis as per Protocol BP is on the low side- he is asymptomatic  HIV: Remains on antiretroviral medications  Hepatitis C: Stable  Below knee amputation  Referred for left stump Orth[facility] called and patient advised to go over there now  This note has been created with Surveyor, quantity. Any transcriptional errors are unintentional.

## 2016-01-22 ENCOUNTER — Encounter (HOSPITAL_COMMUNITY): Payer: Self-pay | Admitting: *Deleted

## 2016-01-22 ENCOUNTER — Emergency Department (HOSPITAL_COMMUNITY)
Admission: EM | Admit: 2016-01-22 | Discharge: 2016-01-22 | Disposition: A | Discharge status: Home / Self Care | Payer: Self-pay | Attending: Emergency Medicine | Admitting: Emergency Medicine

## 2016-01-22 ENCOUNTER — Emergency Department (HOSPITAL_COMMUNITY): Payer: Self-pay

## 2016-01-22 ENCOUNTER — Emergency Department (HOSPITAL_BASED_OUTPATIENT_CLINIC_OR_DEPARTMENT_OTHER)
Admit: 2016-01-22 | Discharge: 2016-01-22 | Disposition: A | Discharge status: Home / Self Care | Payer: Self-pay | Attending: Emergency Medicine | Admitting: Emergency Medicine

## 2016-01-22 DIAGNOSIS — S92001A Unspecified fracture of right calcaneus, initial encounter for closed fracture: Secondary | ICD-10-CM | POA: Insufficient documentation

## 2016-01-22 DIAGNOSIS — Z79899 Other long term (current) drug therapy: Secondary | ICD-10-CM | POA: Insufficient documentation

## 2016-01-22 DIAGNOSIS — Z992 Dependence on renal dialysis: Secondary | ICD-10-CM | POA: Insufficient documentation

## 2016-01-22 DIAGNOSIS — Y999 Unspecified external cause status: Secondary | ICD-10-CM | POA: Insufficient documentation

## 2016-01-22 DIAGNOSIS — Z792 Long term (current) use of antibiotics: Secondary | ICD-10-CM | POA: Insufficient documentation

## 2016-01-22 DIAGNOSIS — X58XXXA Exposure to other specified factors, initial encounter: Secondary | ICD-10-CM | POA: Insufficient documentation

## 2016-01-22 DIAGNOSIS — M79609 Pain in unspecified limb: Secondary | ICD-10-CM

## 2016-01-22 DIAGNOSIS — S92009A Unspecified fracture of unspecified calcaneus, initial encounter for closed fracture: Secondary | ICD-10-CM

## 2016-01-22 DIAGNOSIS — N186 End stage renal disease: Secondary | ICD-10-CM | POA: Insufficient documentation

## 2016-01-22 DIAGNOSIS — Y929 Unspecified place or not applicable: Secondary | ICD-10-CM | POA: Insufficient documentation

## 2016-01-22 DIAGNOSIS — E1122 Type 2 diabetes mellitus with diabetic chronic kidney disease: Secondary | ICD-10-CM | POA: Insufficient documentation

## 2016-01-22 DIAGNOSIS — B20 Human immunodeficiency virus [HIV] disease: Secondary | ICD-10-CM | POA: Insufficient documentation

## 2016-01-22 DIAGNOSIS — M7989 Other specified soft tissue disorders: Secondary | ICD-10-CM

## 2016-01-22 DIAGNOSIS — Y939 Activity, unspecified: Secondary | ICD-10-CM | POA: Insufficient documentation

## 2016-01-22 LAB — CBC WITH DIFFERENTIAL/PLATELET
BASOS ABS: 0 10*3/uL (ref 0.0–0.1)
Basophils Relative: 0 %
Eosinophils Absolute: 0.5 10*3/uL (ref 0.0–0.7)
Eosinophils Relative: 6 %
HEMATOCRIT: 29.9 % — AB (ref 39.0–52.0)
Hemoglobin: 10.2 g/dL — ABNORMAL LOW (ref 13.0–17.0)
LYMPHS PCT: 24 %
Lymphs Abs: 2 10*3/uL (ref 0.7–4.0)
MCH: 34.8 pg — ABNORMAL HIGH (ref 26.0–34.0)
MCHC: 34.1 g/dL (ref 30.0–36.0)
MCV: 102 fL — ABNORMAL HIGH (ref 78.0–100.0)
Monocytes Absolute: 0.6 10*3/uL (ref 0.1–1.0)
Monocytes Relative: 7 %
NEUTROS ABS: 5.4 10*3/uL (ref 1.7–7.7)
Neutrophils Relative %: 63 %
PLATELETS: 189 10*3/uL (ref 150–400)
RBC: 2.93 MIL/uL — AB (ref 4.22–5.81)
RDW: 14.1 % (ref 11.5–15.5)
WBC: 8.7 10*3/uL (ref 4.0–10.5)

## 2016-01-22 LAB — BASIC METABOLIC PANEL
ANION GAP: 14 (ref 5–15)
BUN: 60 mg/dL — ABNORMAL HIGH (ref 6–20)
CO2: 24 mmol/L (ref 22–32)
Calcium: 9.1 mg/dL (ref 8.9–10.3)
Chloride: 98 mmol/L — ABNORMAL LOW (ref 101–111)
Creatinine, Ser: 10.85 mg/dL — ABNORMAL HIGH (ref 0.61–1.24)
GFR, EST AFRICAN AMERICAN: 6 mL/min — AB (ref >60)
GFR, EST NON AFRICAN AMERICAN: 5 mL/min — AB (ref >60)
GLUCOSE: 124 mg/dL — AB (ref 65–99)
POTASSIUM: 5.4 mmol/L — AB (ref 3.5–5.1)
Sodium: 136 mmol/L (ref 135–145)

## 2016-01-22 LAB — I-STAT CG4 LACTIC ACID, ED: Lactic Acid, Venous: 1.09 mmol/L (ref 0.5–1.9)

## 2016-01-22 MED ORDER — HYDROCODONE-ACETAMINOPHEN 5-325 MG PO TABS: 1.0000 | ORAL_TABLET | Freq: Four times a day (QID) | ORAL | 0 refills | Status: DC | PRN

## 2016-01-22 NOTE — ED Triage Notes (Signed)
Pt complains of right leg swelling and pain since Friday. Pt denies injury to foot. Pt is on dialysis, receives dialysis Tue, Thur, Sun.

## 2016-01-22 NOTE — ED Provider Notes (Signed)
[facility] DEPT Provider Note   CSN: [id number] Arrival date & time: 01/22/16  1258     History   Chief Complaint Chief Complaint  Patient presents with  . Foot Swelling    HPI Matan Shartzer is a 45 y.o. male.  HPI Dayna Haberland is a 45 y.o. male with history of HIV disease, hepatitis C, diabetes, end-stage renal disease on dialysis, presents to emergency department complaining of right leg pain. Patient states that he noticed pain in his right ankle 4 days ago. He states that 2 days ago he noticed swelling. He reports painful to ambulate and touch his ankle. He denies any injuries. Patient has left BKA from prior infection. Last dialysis was Saturday. Denies any associated symptoms at this time. He does report subjective fever 3 days ago but none since then. Patient is taking Tylenol.  Spanish phone interpreter used  Past Medical History:  Diagnosis Date  . AIDS ([place]) 11/22/2014  . Chronic diarrhea   . Chronic hepatitis C without hepatic coma ([place]) 11/22/2014  . Diabetic neuropathy ([place])   . ESRD (end stage renal disease) on dialysis [facility])    "TTS; don't remember street name" (05/03/2014)  . Hepatitis C   . HIV INFECTION 06/27/2010   Qualifier: Diagnosis of  By: Nickola Major CMA ( [place]), Geni Bers    . Hypotension 06/02/2012  . Metabolic bone disease 09/17/8500  . MRSA infection   . Normocytic anemia 06/17/2012  . Pancreatitis   . Pressure ulcer of BKA stump, stage 2 ([place]) 11/22/2014  . Renal disorder   . Severe protein-calorie malnutrition ([place]) 06/17/2012  . Uncontrolled diabetes mellitus with complications ([place]) 7/74/1287   Annotation: uncontrolled Qualifier: Diagnosis of  By: Nickola Major CMA Deborra Medina), Jacqueline      Patient Active Problem List   Diagnosis Date Noted  . Pressure ulcer of BKA stump, stage 2 ([place]) 11/22/2014  . Chronic hepatitis C without hepatic coma ([place]) 11/22/2014  . AIDS ([place]) 11/22/2014  . Below knee amputation status ([place]) 06/17/2014   . Diabetic osteomyelitis ([place])   . Hepatitis C virus infection without hepatic coma   . End stage renal failure on dialysis ([place])   . Blood poisoning ([place])   . Poor dentition 10/25/2013  . ESRD on dialysis ([place]) 10/25/2013  . Vision changes 06/21/2013  . Abdominal pain, other specified site 04/19/2013  . Orthostatic hypotension 04/15/2013  . Orthostatic headache 04/15/2013  . Anemia of chronic renal failure 06/19/2012  . Headache(784.0) 06/18/2012  . Metabolic bone disease 86/76/7209  . Chronic diarrhea 06/17/2012  . Severe protein-calorie malnutrition ([place]) 06/17/2012  . Normocytic anemia 06/17/2012  . Syncope and collapse 06/02/2012  . Human immunodeficiency virus (HIV) disease ([place]) 06/27/2010  . RENAL FAILURE, END STAGE 06/27/2010  . Polyneuropathy in diabetes(357.2) 06/27/2010  . Diabetes mellitus with ESRD (end-stage renal disease) ([place]) 01/03/2010    Past Surgical History:  Procedure Laterality Date  . AMPUTATION Left 04/20/2014   Procedure: 3rd toe amputation, 4th Toe Amputation,  5th Toe Amputation;  Surgeon: Newt Minion, MD;  Location: [facility];  Service: Orthopedics;  Laterality: Left;  . AMPUTATION Left 05/02/2014   Procedure: Midfoot Amputation;  Surgeon: Newt Minion, MD;  Location: [facility];  Service: Orthopedics;  Laterality: Left;  . AMPUTATION Left 06/17/2014   Procedure: AMPUTATION BELOW KNEE;  Surgeon: Newt Minion, MD;  Location: [facility];  Service: Orthopedics;  Laterality: Left;  . AV FISTULA PLACEMENT Left   . FOOT AMPUTATION THROUGH ANKLE Left 12/'21/2015  midfoot       Home Medications    Prior to Admission medications   Medication Sig Start Date End Date Taking? Authorizing Provider  Elbasvir-Grazoprevir (ZEPATIER) 50-100 MG TABS Take 1 tablet by mouth daily. 06/07/15   Truman Hayward, MD  lamivudine (EPIVIR) 100 MG tablet Take 1 tablet (100 mg total) by mouth daily. 06/13/15   Truman Hayward, MD  methocarbamol (ROBAXIN) 500 MG tablet Take 1  tablet (500 mg total) by mouth every 6 (six) hours as needed for muscle spasms. 04/22/14   Vivi Barrack, MD  midodrine (PROAMATINE) 10 MG tablet Take 1 tablet (10 mg total) by mouth 3 (three) times daily with meals. Patient not taking: Reported on 12/13/2015 06/07/15   Truman Hayward, MD  omeprazole (PRILOSEC) 40 MG capsule TAKE 1 CAPSULE BY MOUTH EVERY DAY 09/14/15   Campbell Riches, MD  ondansetron (ZOFRAN) 4 MG tablet Take 1 tablet (4 mg total) by mouth every 6 (six) hours as needed for nausea. Patient not taking: Reported on 12/13/2015 04/22/14   Vivi Barrack, MD  polyethylene glycol [facility] Floria Raveling) packet Take 17 g by mouth daily as needed for moderate constipation or severe constipation. Patient not taking: Reported on 12/13/2015 04/22/14   Vivi Barrack, MD  sulfamethoxazole-trimethoprim (BACTRIM,SEPTRA) 400-80 MG tablet TAKE 1 TABLET BY MOUTH DAILY Patient not taking: Reported on 12/13/2015 11/07/15   Truman Hayward, MD  TIVICAY 50 MG tablet TAKE 1 TABLET BY MOUTH DAILY Patient not taking: Reported on 12/13/2015 10/18/15   Truman Hayward, MD  VIREAD 300 MG tablet TAKE 1 TABLET BY MOUTH ONCE A WEEK ON SUNDAY Patient not taking: Reported on 12/13/2015 11/15/15   Campbell Riches, MD    Family History Family History  Problem Relation Age of Onset  . Diabetes Mother   . Diabetes Father     Social History Social History  Substance Use Topics  . Smoking status: Never Smoker  . Smokeless tobacco: Never Used  . Alcohol use No     Allergies   Review of patient's allergies indicates no known allergies.   Review of Systems Review of Systems  Constitutional: Positive for chills and fever.  Respiratory: Negative for cough, chest tightness and shortness of breath.   Cardiovascular: Negative for chest pain, palpitations and leg swelling.  Gastrointestinal: Negative for abdominal distention, abdominal pain, diarrhea, nausea and vomiting.  Musculoskeletal: Positive for  arthralgias and joint swelling. Negative for myalgias, neck pain and neck stiffness.  Skin: Negative for rash.  Allergic/Immunologic: Negative for immunocompromised state.  Neurological: Negative for dizziness, weakness, light-headedness, numbness and headaches.     Physical Exam Updated Vital Signs BP 107/72   Pulse 83   Temp 98.4 F (36.9 C) (Oral)   Resp 20   SpO2 99%   Physical Exam  Constitutional: He is oriented to person, place, and time. He appears well-developed and well-nourished. No distress.  HENT:  Head: Normocephalic and atraumatic.  Eyes: Conjunctivae are normal.  Neck: Neck supple.  Cardiovascular: Normal rate, regular rhythm and normal heart sounds.   Pulmonary/Chest: Effort normal. No respiratory distress. He has no wheezes. He has no rales.  Musculoskeletal: He exhibits no edema.  Left BKA. Swelling to the right ankle and foot. Dorsal pedal pulses intact. Ankle and foot mildly erythematous, warm to touch. Cap refill less than 2 seconds distally. Normal calf and knee.  Neurological: He is alert and oriented to person, place, and  time.  Skin: Skin is warm and dry.  Nursing note and vitals reviewed.    ED Treatments / Results  Labs (all labs ordered are listed, but only abnormal results are displayed) Labs Reviewed  CBC WITH DIFFERENTIAL/PLATELET - Abnormal; Notable for the following:       Result Value   RBC 2.93 (*)    Hemoglobin 10.2 (*)    HCT 29.9 (*)    MCV 102.0 (*)    MCH 34.8 (*)    All other components within normal limits  BASIC METABOLIC PANEL - Abnormal; Notable for the following:    Potassium 5.4 (*)    Chloride 98 (*)    Glucose, Bld 124 (*)    BUN 60 (*)    Creatinine, Ser 10.85 (*)    GFR calc non Af Amer 5 (*)    GFR calc Af Amer 6 (*)    All other components within normal limits  I-STAT CG4 LACTIC ACID, ED  I-STAT CG4 LACTIC ACID, ED    EKG  EKG Interpretation None       Radiology No results  found.  Procedures Procedures (including critical care time)  Medications Ordered in ED Medications - No data to display   Initial Impression / Assessment and Plan / ED Course  I have reviewed the triage vital signs and the nursing notes.  Pertinent labs & imaging results that were available during my care of the patient were reviewed by me and considered in my medical decision making (see chart for details).  Clinical Course   Patient with history of left foot infection with prior BKA, here with right ankle and foot swelling and pain. He denies any injuries will check labs to rule out cellulitis and x-ray for evaluation of possible injury and osteomyelitis.  Patient now is stating that he might have fallen 3 days ago, x-ray showing acute on chronic calcaneus fracture confirmed by CT. Labs unremarkable/at baseline. Patient is due for dialysis tomorrow, potassium mildly elevated. He is not febrile, no evidence of infection on labs of vital signs. I discussed patient's fracture with Dr.Xu, advised to place him in a Cam Walker, pain medications, follow-up with patient's surgeon who is Dr. Sharol Given. Results and plan discussed with patient via interpreter phone, questions answered.  Vitals:   01/22/16 1304 01/22/16 1308 01/22/16 1509 01/22/16 1744  BP:  (!) 99/52 107/72 102/64  Pulse:  88 83 81  Resp:  18 20 18   Temp: 98.4 F (36.9 C)     TempSrc: Oral     SpO2:  98% 99% 99%       Final Clinical Impressions(s) / ED Diagnoses   Final diagnoses:  Calcaneal fracture  Calcaneal fracture, right, closed, initial encounter    New Prescriptions New Prescriptions   HYDROCODONE-ACETAMINOPHEN (NORCO) 5-325 MG TABLET    Take 1 tablet by mouth every 6 (six) hours as needed.     Jeannett Senior, PA-C 01/22/16 2109    Alfonzo Beers, MD 01/22/16 2132

## 2016-01-22 NOTE — Discharge Instructions (Signed)
Keep your foot elevated at home. Take Norco for severe pain as needed. Please follow-up with Dr. Sharol Given for further evaluation and treatment of your fracture.

## 2016-01-22 NOTE — ED Notes (Signed)
PT DISCHARGED. INSTRUCTIONS AND PRESCRIPTION GIVEN. AAOX4. PT IN NO APPARENT DISTRESS. THE OPPORTUNITY TO ASK QUESTIONS WAS PROVIDED. 

## 2016-01-22 NOTE — Progress Notes (Signed)
VASCULAR LAB PRELIMINARY  PRELIMINARY  PRELIMINARY  PRELIMINARY  Right lower extremity venous duplex completed.    Preliminary report:  Right:  No evidence of DVT, superficial thrombosis, or Baker's cyst.  Jamorion Gomillion, RVS 01/22/2016, 7:30 PM

## 2016-01-26 ENCOUNTER — Ambulatory Visit: Payer: Self-pay | Admitting: *Deleted

## 2016-01-26 VITALS — BP 102/68 | Temp 96.3°F

## 2016-01-26 DIAGNOSIS — B20 Human immunodeficiency virus [HIV] disease: Secondary | ICD-10-CM

## 2016-02-01 ENCOUNTER — Other Ambulatory Visit: Payer: Self-pay | Admitting: Infectious Disease

## 2016-02-01 DIAGNOSIS — B20 Human immunodeficiency virus [HIV] disease: Secondary | ICD-10-CM

## 2016-02-23 ENCOUNTER — Other Ambulatory Visit: Payer: Self-pay | Admitting: Infectious Disease

## 2016-02-23 DIAGNOSIS — B20 Human immunodeficiency virus [HIV] disease: Secondary | ICD-10-CM

## 2016-02-23 MED ORDER — TENOFOVIR DISOPROXIL FUMARATE 300 MG PO TABS: ORAL_TABLET | ORAL | 3 refills | Status: DC

## 2016-03-03 ENCOUNTER — Encounter (HOSPITAL_COMMUNITY): Payer: Self-pay

## 2016-03-03 ENCOUNTER — Emergency Department (HOSPITAL_COMMUNITY): Payer: Medicaid Other

## 2016-03-03 ENCOUNTER — Inpatient Hospital Stay (HOSPITAL_COMMUNITY)
Admission: EM | Admit: 2016-03-03 | Discharge: 2016-03-07 | DRG: 871 | Disposition: A | Discharge status: Home / Self Care | Payer: Medicaid Other | Attending: Pulmonary Disease | Admitting: Pulmonary Disease

## 2016-03-03 DIAGNOSIS — M791 Myalgia, unspecified site: Secondary | ICD-10-CM

## 2016-03-03 DIAGNOSIS — Z91120 Patient's intentional underdosing of medication regimen due to financial hardship: Secondary | ICD-10-CM | POA: Diagnosis not present

## 2016-03-03 DIAGNOSIS — A419 Sepsis, unspecified organism: Secondary | ICD-10-CM | POA: Diagnosis present

## 2016-03-03 DIAGNOSIS — N390 Urinary tract infection, site not specified: Secondary | ICD-10-CM | POA: Diagnosis present

## 2016-03-03 DIAGNOSIS — A09 Infectious gastroenteritis and colitis, unspecified: Secondary | ICD-10-CM | POA: Diagnosis present

## 2016-03-03 DIAGNOSIS — B182 Chronic viral hepatitis C: Secondary | ICD-10-CM | POA: Diagnosis present

## 2016-03-03 DIAGNOSIS — E1140 Type 2 diabetes mellitus with diabetic neuropathy, unspecified: Secondary | ICD-10-CM | POA: Diagnosis present

## 2016-03-03 DIAGNOSIS — R579 Shock, unspecified: Secondary | ICD-10-CM | POA: Diagnosis present

## 2016-03-03 DIAGNOSIS — Z79899 Other long term (current) drug therapy: Secondary | ICD-10-CM | POA: Diagnosis not present

## 2016-03-03 DIAGNOSIS — Z89512 Acquired absence of left leg below knee: Secondary | ICD-10-CM | POA: Diagnosis not present

## 2016-03-03 DIAGNOSIS — E11610 Type 2 diabetes mellitus with diabetic neuropathic arthropathy: Secondary | ICD-10-CM | POA: Diagnosis present

## 2016-03-03 DIAGNOSIS — Y92009 Unspecified place in unspecified non-institutional (private) residence as the place of occurrence of the external cause: Secondary | ICD-10-CM | POA: Diagnosis not present

## 2016-03-03 DIAGNOSIS — N186 End stage renal disease: Secondary | ICD-10-CM | POA: Diagnosis present

## 2016-03-03 DIAGNOSIS — R6521 Severe sepsis with septic shock: Secondary | ICD-10-CM | POA: Diagnosis present

## 2016-03-03 DIAGNOSIS — I248 Other forms of acute ischemic heart disease: Secondary | ICD-10-CM | POA: Diagnosis present

## 2016-03-03 DIAGNOSIS — R269 Unspecified abnormalities of gait and mobility: Secondary | ICD-10-CM

## 2016-03-03 DIAGNOSIS — E1122 Type 2 diabetes mellitus with diabetic chronic kidney disease: Secondary | ICD-10-CM | POA: Diagnosis present

## 2016-03-03 DIAGNOSIS — D631 Anemia in chronic kidney disease: Secondary | ICD-10-CM | POA: Diagnosis present

## 2016-03-03 DIAGNOSIS — Z21 Asymptomatic human immunodeficiency virus [HIV] infection status: Secondary | ICD-10-CM | POA: Diagnosis present

## 2016-03-03 DIAGNOSIS — I700 Atherosclerosis of aorta: Secondary | ICD-10-CM | POA: Diagnosis present

## 2016-03-03 DIAGNOSIS — I9589 Other hypotension: Secondary | ICD-10-CM | POA: Diagnosis present

## 2016-03-03 DIAGNOSIS — B20 Human immunodeficiency virus [HIV] disease: Secondary | ICD-10-CM | POA: Diagnosis present

## 2016-03-03 DIAGNOSIS — Z992 Dependence on renal dialysis: Secondary | ICD-10-CM

## 2016-03-03 DIAGNOSIS — E860 Dehydration: Secondary | ICD-10-CM | POA: Diagnosis present

## 2016-03-03 DIAGNOSIS — N2581 Secondary hyperparathyroidism of renal origin: Secondary | ICD-10-CM | POA: Diagnosis present

## 2016-03-03 DIAGNOSIS — Z8614 Personal history of Methicillin resistant Staphylococcus aureus infection: Secondary | ICD-10-CM | POA: Diagnosis not present

## 2016-03-03 DIAGNOSIS — K529 Noninfective gastroenteritis and colitis, unspecified: Secondary | ICD-10-CM

## 2016-03-03 DIAGNOSIS — R578 Other shock: Secondary | ICD-10-CM | POA: Diagnosis present

## 2016-03-03 DIAGNOSIS — Z833 Family history of diabetes mellitus: Secondary | ICD-10-CM

## 2016-03-03 DIAGNOSIS — E1151 Type 2 diabetes mellitus with diabetic peripheral angiopathy without gangrene: Secondary | ICD-10-CM | POA: Diagnosis present

## 2016-03-03 DIAGNOSIS — T444X6A Underdosing of predominantly alpha-adrenoreceptor agonists, initial encounter: Secondary | ICD-10-CM | POA: Diagnosis present

## 2016-03-03 DIAGNOSIS — M84474D Pathological fracture, right foot, subsequent encounter for fracture with routine healing: Secondary | ICD-10-CM | POA: Diagnosis present

## 2016-03-03 DIAGNOSIS — R109 Unspecified abdominal pain: Secondary | ICD-10-CM

## 2016-03-03 DIAGNOSIS — L0291 Cutaneous abscess, unspecified: Secondary | ICD-10-CM

## 2016-03-03 LAB — CBC WITH DIFFERENTIAL/PLATELET
BASOS ABS: 0 10*3/uL (ref 0.0–0.1)
BASOS ABS: 0 10*3/uL (ref 0.0–0.1)
Basophils Relative: 0 %
Basophils Relative: 0 %
EOS ABS: 0.1 10*3/uL (ref 0.0–0.7)
EOS ABS: 0 10*3/uL (ref 0.0–0.7)
Eosinophils Relative: 1 %
Eosinophils Relative: 0 %
HCT: 32.4 % — ABNORMAL LOW (ref 39.0–52.0)
HCT: 31.1 % — ABNORMAL LOW (ref 39.0–52.0)
HEMOGLOBIN: 10.7 g/dL — AB (ref 13.0–17.0)
HEMOGLOBIN: 10.1 g/dL — AB (ref 13.0–17.0)
LYMPHS PCT: 12 %
LYMPHS PCT: 19 %
Lymphs Abs: 1.4 10*3/uL (ref 0.7–4.0)
Lymphs Abs: 2.7 10*3/uL (ref 0.7–4.0)
MCH: 34.5 pg — ABNORMAL HIGH (ref 26.0–34.0)
MCH: 33.8 pg (ref 26.0–34.0)
MCHC: 33 g/dL (ref 30.0–36.0)
MCHC: 32.5 g/dL (ref 30.0–36.0)
MCV: 104.5 fL — ABNORMAL HIGH (ref 78.0–100.0)
MCV: 104 fL — ABNORMAL HIGH (ref 78.0–100.0)
MONOS PCT: 12 %
Monocytes Absolute: 1.4 10*3/uL — ABNORMAL HIGH (ref 0.1–1.0)
Monocytes Absolute: 1.1 10*3/uL — ABNORMAL HIGH (ref 0.1–1.0)
Monocytes Relative: 8 %
NEUTROS PCT: 75 %
NEUTROS PCT: 73 %
Neutro Abs: 8.7 10*3/uL — ABNORMAL HIGH (ref 1.7–7.7)
Neutro Abs: 10.3 10*3/uL — ABNORMAL HIGH (ref 1.7–7.7)
Platelets: 192 10*3/uL (ref 150–400)
Platelets: 185 10*3/uL (ref 150–400)
RBC: 3.1 MIL/uL — AB (ref 4.22–5.81)
RBC: 2.99 MIL/uL — AB (ref 4.22–5.81)
RDW: 13.8 % (ref 11.5–15.5)
RDW: 13.8 % (ref 11.5–15.5)
WBC: 11.6 10*3/uL — AB (ref 4.0–10.5)
WBC: 14.1 10*3/uL — AB (ref 4.0–10.5)

## 2016-03-03 LAB — I-STAT ARTERIAL BLOOD GAS, ED
Acid-Base Excess: 2 mmol/L (ref 0.0–2.0)
Bicarbonate: 26.2 mmol/L (ref 20.0–28.0)
O2 Saturation: 69 %
PH ART: 7.46 — AB (ref 7.350–7.450)
TCO2: 27 mmol/L (ref 0–100)
pCO2 arterial: 36.9 mmHg (ref 32.0–48.0)
pO2, Arterial: 34 mmHg — CL (ref 83.0–108.0)

## 2016-03-03 LAB — COMPREHENSIVE METABOLIC PANEL
ALK PHOS: 88 U/L (ref 38–126)
ALK PHOS: 81 U/L (ref 38–126)
ALT: 9 U/L — ABNORMAL LOW (ref 17–63)
ALT: 10 U/L — ABNORMAL LOW (ref 17–63)
ANION GAP: 8 (ref 5–15)
ANION GAP: 11 (ref 5–15)
AST: 27 U/L (ref 15–41)
AST: 23 U/L (ref 15–41)
Albumin: 2.9 g/dL — ABNORMAL LOW (ref 3.5–5.0)
Albumin: 2.5 g/dL — ABNORMAL LOW (ref 3.5–5.0)
BILIRUBIN TOTAL: 0.7 mg/dL (ref 0.3–1.2)
BILIRUBIN TOTAL: 0.9 mg/dL (ref 0.3–1.2)
BUN: 29 mg/dL — ABNORMAL HIGH (ref 6–20)
BUN: 30 mg/dL — ABNORMAL HIGH (ref 6–20)
CALCIUM: 8.5 mg/dL — AB (ref 8.9–10.3)
CALCIUM: 8.3 mg/dL — AB (ref 8.9–10.3)
CO2: 25 mmol/L (ref 22–32)
CO2: 20 mmol/L — AB (ref 22–32)
Chloride: 105 mmol/L (ref 101–111)
Chloride: 106 mmol/L (ref 101–111)
Creatinine, Ser: 8.36 mg/dL — ABNORMAL HIGH (ref 0.61–1.24)
Creatinine, Ser: 8.51 mg/dL — ABNORMAL HIGH (ref 0.61–1.24)
GFR calc non Af Amer: 7 mL/min — ABNORMAL LOW (ref >60)
GFR calc non Af Amer: 7 mL/min — ABNORMAL LOW (ref >60)
GFR, EST AFRICAN AMERICAN: 8 mL/min — AB (ref >60)
GFR, EST AFRICAN AMERICAN: 8 mL/min — AB (ref >60)
Glucose, Bld: 73 mg/dL (ref 65–99)
Glucose, Bld: 154 mg/dL — ABNORMAL HIGH (ref 65–99)
POTASSIUM: 4 mmol/L (ref 3.5–5.1)
Potassium: 4.4 mmol/L (ref 3.5–5.1)
SODIUM: 137 mmol/L (ref 135–145)
Sodium: 138 mmol/L (ref 135–145)
TOTAL PROTEIN: 7.6 g/dL (ref 6.5–8.1)
TOTAL PROTEIN: 6.9 g/dL (ref 6.5–8.1)

## 2016-03-03 LAB — LIPASE, BLOOD
LIPASE: 18 U/L (ref 11–51)
Lipase: 22 U/L (ref 11–51)

## 2016-03-03 LAB — CBG MONITORING, ED: GLUCOSE-CAPILLARY: 105 mg/dL — AB (ref 65–99)

## 2016-03-03 LAB — TROPONIN I

## 2016-03-03 LAB — LACTIC ACID, PLASMA
LACTIC ACID, VENOUS: 1 mmol/L (ref 0.5–1.9)
Lactic Acid, Venous: 1.2 mmol/L (ref 0.5–1.9)

## 2016-03-03 LAB — PROTIME-INR
INR: 1.37
PROTHROMBIN TIME: 17 s — AB (ref 11.4–15.2)

## 2016-03-03 LAB — PROCALCITONIN: Procalcitonin: 5.56 ng/mL

## 2016-03-03 LAB — GLUCOSE, CAPILLARY
Glucose-Capillary: 148 mg/dL — ABNORMAL HIGH (ref 65–99)
Glucose-Capillary: 124 mg/dL — ABNORMAL HIGH (ref 65–99)

## 2016-03-03 LAB — I-STAT CG4 LACTIC ACID, ED: Lactic Acid, Venous: 1.22 mmol/L (ref 0.5–1.9)

## 2016-03-03 LAB — CK: Total CK: 83 U/L (ref 49–397)

## 2016-03-03 LAB — MRSA PCR SCREENING: MRSA by PCR: NEGATIVE

## 2016-03-03 LAB — AMYLASE: Amylase: 119 U/L — ABNORMAL HIGH (ref 28–100)

## 2016-03-03 LAB — LACTATE DEHYDROGENASE: LDH: 231 U/L — AB (ref 98–192)

## 2016-03-03 MED ORDER — SODIUM CHLORIDE 0.9 % IV BOLUS (SEPSIS)
500.0000 mL | Freq: Once | INTRAVENOUS | Status: AC
  Administered 2016-03-03: 500 mL via INTRAVENOUS

## 2016-03-03 MED ORDER — HEPARIN SODIUM (PORCINE) 5000 UNIT/ML IJ SOLN
5000.0000 [IU] | Freq: Three times a day (TID) | INTRAMUSCULAR | Status: DC
  Administered 2016-03-03 – 2016-03-07 (×11): 5000 [IU] via SUBCUTANEOUS
  Filled 2016-03-03 (×11): qty 1

## 2016-03-03 MED ORDER — PIPERACILLIN-TAZOBACTAM 3.375 G IVPB 30 MIN
3.3750 g | Freq: Once | INTRAVENOUS | Status: AC
  Administered 2016-03-03: 3.375 g via INTRAVENOUS
  Filled 2016-03-03: qty 50

## 2016-03-03 MED ORDER — NOREPINEPHRINE BITARTRATE 1 MG/ML IV SOLN
0.0000 ug/min | Freq: Once | INTRAVENOUS | Status: AC
  Administered 2016-03-03: 5 ug/min via INTRAVENOUS
  Filled 2016-03-03: qty 4

## 2016-03-03 MED ORDER — SODIUM CHLORIDE 0.9 % IV SOLN: 250.0000 mL | INTRAVENOUS | Status: DC | PRN

## 2016-03-03 MED ORDER — SODIUM CHLORIDE 0.9 % IV BOLUS (SEPSIS)
250.0000 mL | Freq: Once | INTRAVENOUS | Status: AC
  Administered 2016-03-03: 250 mL via INTRAVENOUS

## 2016-03-03 MED ORDER — IBUPROFEN 400 MG PO TABS
600.0000 mg | ORAL_TABLET | Freq: Once | ORAL | Status: AC
  Administered 2016-03-03: 600 mg via ORAL
  Filled 2016-03-03: qty 1

## 2016-03-03 MED ORDER — VANCOMYCIN HCL IN DEXTROSE 1-5 GM/200ML-% IV SOLN
1000.0000 mg | Freq: Once | INTRAVENOUS | Status: AC
  Administered 2016-03-03: 1000 mg via INTRAVENOUS
  Filled 2016-03-03: qty 200

## 2016-03-03 MED ORDER — INSULIN ASPART 100 UNIT/ML ~~LOC~~ SOLN
1.0000 [IU] | SUBCUTANEOUS | Status: DC
  Administered 2016-03-03 (×2): 1 [IU] via SUBCUTANEOUS
  Administered 2016-03-04: 2 [IU] via SUBCUTANEOUS
  Administered 2016-03-04: 3 [IU] via SUBCUTANEOUS
  Administered 2016-03-04 – 2016-03-05 (×4): 1 [IU] via SUBCUTANEOUS

## 2016-03-03 MED ORDER — SODIUM CHLORIDE 0.9 % IV SOLN: INTRAVENOUS | Status: DC

## 2016-03-03 MED ORDER — VANCOMYCIN HCL 500 MG IV SOLR
500.0000 mg | INTRAVENOUS | Status: DC
  Filled 2016-03-03: qty 500

## 2016-03-03 MED ORDER — DEXTROSE 5 % IV SOLN
30.0000 ug/min | INTRAVENOUS | Status: DC
  Administered 2016-03-03: 30 ug/min via INTRAVENOUS
  Administered 2016-03-04 (×2): 20 ug/min via INTRAVENOUS
  Filled 2016-03-03 (×3): qty 1

## 2016-03-03 MED ORDER — PIPERACILLIN-TAZOBACTAM 3.375 G IVPB
3.3750 g | Freq: Two times a day (BID) | INTRAVENOUS | Status: DC
  Administered 2016-03-03 – 2016-03-06 (×6): 3.375 g via INTRAVENOUS
  Filled 2016-03-03 (×8): qty 50

## 2016-03-03 MED ORDER — SODIUM CHLORIDE 0.9 % IV BOLUS (SEPSIS)
1000.0000 mL | Freq: Once | INTRAVENOUS | Status: AC
  Administered 2016-03-03: 1000 mL via INTRAVENOUS

## 2016-03-03 NOTE — ED Notes (Signed)
RT at bedside for ABG collection Lab aware of need for second blood cultures

## 2016-03-03 NOTE — ED Notes (Signed)
MD Cardama at the bedside assessing patient.

## 2016-03-03 NOTE — H&P (Signed)
PULMONARY / CRITICAL CARE MEDICINE   Name: Alvin Daniels MRN: [id number] DOB: [birth year]    ADMISSION DATE:  03/03/2016 CONSULTATION DATE:  03/03/2016   REFERRING MD:  Dr Tristan Schroeder of ER  CHIEF COMPLAINT:  Circulatory shock -suspected sepsis  HISTORY OF PRESENT ILLNESS:   Alvin Daniels is a 45 y.o.  male of Hispanic or Latino was diabetes end-stage renal disease on Tuesday Thursday Saturday dialysis. He is also HIV positive with last CD4 count reported as 240. History is obtained from the emergency room physician and the bedside nurse. Patient speaks very poor English and therefore I could not understand what he is trying to say. As best as I can gather last dialysis was 03/02/2016 yesterday Saturday. He did before that or after this started having fever or chills. When he presented to the ER today he reported that his dry weight from 49 kg had reduced to 42 kg postdialysis yesterday. ER physician found to him to be in chills and rigors and subjective fevers but no documented fever. Patient was also hypotensive. 3 L saline bolus was given but patient continued to be hypotensive and needed levo fed. Upon initiation of Levophed blood pressure responded quite well and patient got tapered down to 2 mcg/m of Levophed at the time of critical care medicine evaluation. He otherwise feels fine and is communicative. Levothroid is being given by the peripheral vein.   PAST MEDICAL HISTORY :  He  has a past medical history of AIDS ([place]) (11/22/2014); Chronic diarrhea; Chronic hepatitis C without hepatic coma ([place]) (11/22/2014); Diabetic neuropathy ([place]); ESRD (end stage renal disease) on dialysis ([place]); Hepatitis C; HIV INFECTION (06/27/2010); Hypotension (06/02/2012); Metabolic bone disease (06/16/5359); MRSA infection; Normocytic anemia (06/17/2012); Pancreatitis; Pressure ulcer of BKA stump, stage 2 ([place]) (11/22/2014); Renal disorder; Severe protein-calorie malnutrition ([place]) (06/17/2012); and  Uncontrolled diabetes mellitus with complications ([place]) (4/43/1540).  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  No Known Allergies  No current facility-administered medications on file prior to encounter.    Current Outpatient Prescriptions on File Prior to Encounter  Medication Sig  . Elbasvir-Grazoprevir (ZEPATIER) 50-100 MG TABS Take 1 tablet by mouth daily.  Marland Kitchen HYDROcodone-acetaminophen (NORCO) 5-325 MG tablet Take 1 tablet by mouth every 6 (six) hours as needed.  . lamivudine (EPIVIR) 100 MG tablet Take 1 tablet (100 mg total) by mouth daily.  . methocarbamol (ROBAXIN) 500 MG tablet Take 1 tablet (500 mg total) by mouth every 6 (six) hours as needed for muscle spasms.  . midodrine (PROAMATINE) 10 MG tablet Take 1 tablet (10 mg total) by mouth 3 (three) times daily with meals. (Patient not taking: Reported on 12/13/2015)  . omeprazole (PRILOSEC) 40 MG capsule TAKE 1 CAPSULE BY MOUTH EVERY DAY  . ondansetron (ZOFRAN) 4 MG tablet Take 1 tablet (4 mg total) by mouth every 6 (six) hours as needed for nausea. (Patient not taking: Reported on 12/13/2015)  . polyethylene glycol (MIRALAX / GLYCOLAX) packet Take 17 g by mouth daily as needed for moderate constipation or severe constipation. (Patient not taking: Reported on 12/13/2015)  . sulfamethoxazole-trimethoprim (BACTRIM,SEPTRA) 400-80 MG tablet TAKE 1 TABLET BY MOUTH DAILY  . tenofovir (VIREAD) 300 MG tablet TAKE 1 TABLET BY MOUTH ONCE A WEEK ON SUNDAY  . TIVICAY 50 MG tablet TAKE 1 TABLET BY MOUTH EVERY DAY    FAMILY HISTORY:  His indicated that his mother is alive. He indicated that his father is alive.    SOCIAL HISTORY: He  reports that he has never smoked. He has never used smokeless tobacco. He reports that he does not drink alcohol or use  drugs.  REVIEW OF SYSTEMS:   Unable to obtain 11.2 system because patient is critically ill and also because of language barrier   VITAL SIGNS: BP 100/55   Pulse 113   Temp 100.3 F (37.9 C) (Rectal)   Resp 15   SpO2 95%   HEMODYNAMICS:    VENTILATOR SETTINGS:    INTAKE / OUTPUT: No intake/output data recorded.  PHYSICAL EXAMINATION: General:  Comfortable pleasant in no distress Hispanic male lying in the stretcher in emergency room A07 Neuro:  Alert and oriented 3. Speech normal. Moves all 4 extremities HEENT:  Moist mucous membranes no elevated JVP and neck nodes Cardiovascular:  Regular rate and rhythm. Mildly tachycardic. No murmurs Lungs:  Clear to auscultation bilaterally. No respiratory distress Abdomen:  Soft nontender no organomegaly although he says it feels uncomfortable Musculoskeletal:  AV graft present in upper arm. In the lower extremity is this evidence of amputation Skin:  Intact in the exposed areas  LABS:  PULMONARY  Recent Labs Lab 03/03/16 1439  PHART 7.460*  PCO2ART 36.9  PO2ART 34.0*  HCO3 26.2  TCO2 27  O2SAT 69.0    CBC  Recent Labs Lab 03/03/16 1446  HGB 10.7*  HCT 32.4*  WBC 11.6*  PLT 192    COAGULATION No results for input(s): INR in the last 168 hours.  CARDIAC  No results for input(s): TROPONINI in the last 168 hours. No results for input(s): PROBNP in the last 168 hours.   CHEMISTRY  Recent Labs Lab 03/03/16 1446  NA 138  K 4.0  CL 105  CO2 25  GLUCOSE 73  BUN 29*  CREATININE 8.36*  CALCIUM 8.5*   CrCl cannot be calculated (Unknown ideal weight.).   LIVER  Recent Labs Lab 03/03/16 1446  AST 27  ALT 9*  ALKPHOS 88  BILITOT 0.7  PROT 7.6  ALBUMIN 2.9*     INFECTIOUS  Recent Labs Lab 03/03/16 1540  LATICACIDVEN 1.22     ENDOCRINE CBG (last 3)  No results for input(s): GLUCAP in the last 72 hours.       IMAGING x48h  - image(s) personally visualized  -   highlighted in  bold Dg Chest Port 1 View  Result Date: 03/03/2016 CLINICAL DATA:  Bilateral flank pain EXAM: PORTABLE CHEST 1 VIEW COMPARISON:  02/17/2015 FINDINGS: Cardiac shadow is within normal limits. The lungs are clear bilaterally. No acute bony abnormality is seen. IMPRESSION: No active disease. Electronically Signed   By: Inez Catalina M.D.   On: 03/03/2016 14:58     ASSESSMENT / PLAN:  PULMONARY A: No acute respiratory distress P:   Monitor for intubation needs that seems to be low risk  CARDIOVASCULAR A:  Circulator shock despite fluid resuscitation - probably a combination of viral prodrome and also being below dry weight following dialysis 1 day prior to admission on 03/02/2016  - Status post 3 unit of fluid bolus and on continuous 100 mL saline currently off at 2 mcg/m P:  Change Levophed and Neo-Synephrine to peripheral vein Hydrate carefully [based on chest x-ray is probably dehydrated]  RENAL A:   End-stage renal disease on Tuesday Thursday Saturday dialysis P:   Consult renal on Monday, 03/04/2016  GASTROINTESTINAL A:   ?  Gastric discomfort P:   Monitor PPI  HEMATOLOGIC A:    anemia of chronic illness  P:  Monitor for decline in the ICU setting  INFECTIOUS A:   #Baseline HIV -  infectious diseases clinic  #Current - Unclear bacterial sepsis Or viral prodrome P:   Broad antibiotics-vancomycin 7 and Zosyn  ID consult Monday, 03/04/2016    ENDOCRINE A:   Diabetes  P:   IC hyperglycemia protocol   NEUROLOGIC A:   Intact   P:   Monitor  RASS goal: 0   FAMILY  - Updates: Could not updated patient due to language barrier although tried on 03/03/2016  - Inter-disciplinary family meet or Palliative Care meeting due by:03/10/2016  Admit intensive care unit     The patient is critically ill with multiple organ systems failure and requires high complexity decision making for assessment and support, frequent evaluation and titration of therapies,  application of advanced monitoring technologies and extensive interpretation of multiple databases.   Critical Care Time devoted to patient care services described in this note is  30  Minutes. This time reflects time of care of this signee Dr Brand Males. This critical care time does not reflect procedure time, or teaching time or supervisory time of PA/NP/Med student/Med Resident etc but could involve care discussion time    Dr. Brand Males, M.D., [facility].C.P Pulmonary and Critical Care Medicine Staff Physician [place] [facility] Pager: [phone], If no answer or between  15:00h - 7:00h: call [phone]  03/03/2016 5:13 PM

## 2016-03-03 NOTE — ED Notes (Signed)
Attempted report RN unable to take report

## 2016-03-03 NOTE — Progress Notes (Signed)
[facility] Progress Note Patient Name: Alvin Daniels DOB: 30-Aug-1970 MRN: [id number]   Date of Service  03/03/2016  HPI/Events of Note  Sepsis - Repeat Assessment  Performed at:    20:02  Vitals     Blood pressure 96/62, pulse (!) 106, temperature 98.7 F (37.1 C), temperature source Oral, resp. rate 17, weight 55.7 kg (122 lb 12.7 oz), SpO2 99 %.     Capillary Refill: <2 sec      eICU Interventions       Intervention Category Major Interventions: Sepsis - evaluation and management  [name] 03/03/2016, 8:02 PM

## 2016-03-03 NOTE — ED Notes (Signed)
Pt c/o chest pain ekg re run and shown to ER MD

## 2016-03-03 NOTE — ED Notes (Signed)
Pt's BP continues to drop pt remains awake and alert ER MD at [facility] orders received and carried out

## 2016-03-03 NOTE — ED Notes (Signed)
Family at bedside. 

## 2016-03-03 NOTE — Progress Notes (Signed)
Pharmacy Antibiotic Note  Alvin Daniels is a 45 y.o. male presenting with back, flank, & neck pain admitted with soft BP & tachycardia on 03/03/2016 with sepsis.  Pharmacy has been consulted for Vancomycin and Zosyn dosing.  He is ESRD-TTS dialysis and received full session yesterday.  Pt has not been receiving antibiotics at dialysis.   Plan: Vancomycin 1000mg  IV x 1, then 500mg  IV qTTS Zosyn 3.375g IV x 1, then q12 infusing over 4hr F/U cx results Check pre Vanc trough at steady state (goal 15-25)    Temp (24hrs), Avg:99.1 F (37.3 C), Min:99.1 F (37.3 C), Max:99.1 F (37.3 C)  No results for input(s): WBC, CREATININE, LATICACIDVEN, VANCOTROUGH, VANCOPEAK, VANCORANDOM, GENTTROUGH, GENTPEAK, GENTRANDOM, TOBRATROUGH, TOBRAPEAK, TOBRARND, AMIKACINPEAK, AMIKACINTROU, AMIKACIN in the last 168 hours.  CrCl cannot be calculated (Unknown ideal weight.).    No Known Allergies  Antimicrobials this admission: Vanc 10/22 >>  Zosyn 10/22 >>   Dose adjustments this admission:   Microbiology results: 10/22 BCx:  10/22 UCx:     Thank you for allowing pharmacy to be a part of this patient's care.   Gracy Bruins, PharmD Clinical Pharmacist [facility]

## 2016-03-03 NOTE — ED Provider Notes (Signed)
[facility] DEPT Provider Note   CSN: [id number] Arrival date & time: 03/03/16  1358     History   Chief Complaint Chief Complaint  Patient presents with  . Flank Pain    HPI Briant Zhen is a 45 y.o. male.  HPI 45 year old male with a history of AIDS currently on HAART, ESRD on dialysis Tuesday Thursdays and Saturdays, hep C, diabetes who presents to the ED with subjective fevers, myalgias, upper back and chest pain that began yesterday and is gradually worsened. He also endorses some nausea and vomiting 1 this morning. Mild nonproductive cough. No abdominal pain or diarrhea.  Patient reports that his usual dry weight is 49 kg and states that when he left dialysis yesterday he was 42 kg.  Also reports right ankle fracture that has been going on for 2 months following calcaneal fracture. Is followed up by vascular surgery and orthopedic surgery. Swelling and pain have improved since.  Past Medical History:  Diagnosis Date  . AIDS ([place]) 11/22/2014  . Chronic diarrhea   . Chronic hepatitis C without hepatic coma ([place]) 11/22/2014  . Diabetic neuropathy ([place])   . ESRD (end stage renal disease) on dialysis [facility])    "TTS; don't remember street name" (05/03/2014)  . Hepatitis C   . HIV INFECTION 06/27/2010   Qualifier: Diagnosis of  By: Nickola Major CMA ( [place]), Geni Bers    . Hypotension 06/02/2012  . Metabolic bone disease 0/12/6576  . MRSA infection   . Normocytic anemia 06/17/2012  . Pancreatitis   . Pressure ulcer of BKA stump, stage 2 ([place]) 11/22/2014  . Renal disorder   . Severe protein-calorie malnutrition ([place]) 06/17/2012  . Uncontrolled diabetes mellitus with complications ([place]) 4/69/6295   Annotation: uncontrolled Qualifier: Diagnosis of  By: Nickola Major CMA Deborra Medina), Jacqueline      Patient Active Problem List   Diagnosis Date Noted  . Shock circulatory ([place]) 03/03/2016  . Pressure ulcer of BKA stump, stage 2 ([place]) 11/22/2014  . Chronic hepatitis C without  hepatic coma ([place]) 11/22/2014  . AIDS ([place]) 11/22/2014  . Below knee amputation status ([place]) 06/17/2014  . Diabetic osteomyelitis ([place])   . Hepatitis C virus infection without hepatic coma   . End stage renal failure on dialysis ([place])   . Blood poisoning ([place])   . Poor dentition 10/25/2013  . ESRD on dialysis ([place]) 10/25/2013  . Vision changes 06/21/2013  . Abdominal pain, other specified site 04/19/2013  . Orthostatic hypotension 04/15/2013  . Orthostatic headache 04/15/2013  . Anemia of chronic renal failure 06/19/2012  . Headache(784.0) 06/18/2012  . Metabolic bone disease 28/41/3244  . Chronic diarrhea 06/17/2012  . Severe protein-calorie malnutrition ([place]) 06/17/2012  . Normocytic anemia 06/17/2012  . Syncope and collapse 06/02/2012  . Human immunodeficiency virus (HIV) disease ([place]) 06/27/2010  . RENAL FAILURE, END STAGE 06/27/2010  . Polyneuropathy in diabetes(357.2) 06/27/2010  . Diabetes mellitus with ESRD (end-stage renal disease) ([place]) 01/03/2010    Past Surgical History:  Procedure Laterality Date  . AMPUTATION Left 04/20/2014   Procedure: 3rd toe amputation, 4th Toe Amputation,  5th Toe Amputation;  Surgeon: Newt Minion, MD;  Location: [facility];  Service: Orthopedics;  Laterality: Left;  . AMPUTATION Left 05/02/2014   Procedure: Midfoot Amputation;  Surgeon: Newt Minion, MD;  Location: [facility];  Service: Orthopedics;  Laterality: Left;  . AMPUTATION Left 06/17/2014   Procedure: AMPUTATION BELOW KNEE;  Surgeon: Newt Minion, MD;  Location: [facility];  Service: Orthopedics;  Laterality: Left;  .  AV FISTULA PLACEMENT Left   . FOOT AMPUTATION THROUGH ANKLE Left 12/'21/2015   midfoot       Home Medications    Prior to Admission medications   Medication Sig Start Date End Date Taking? Authorizing Provider  Elbasvir-Grazoprevir (ZEPATIER) 50-100 MG TABS Take 1 tablet by mouth daily. 06/07/15   Truman Hayward, MD  HYDROcodone-acetaminophen [facility]) 5-325 MG tablet Take  1 tablet by mouth every 6 (six) hours as needed. 01/22/16   Tatyana Kirichenko, PA-C  lamivudine (EPIVIR) 100 MG tablet Take 1 tablet (100 mg total) by mouth daily. 06/13/15   Truman Hayward, MD  methocarbamol (ROBAXIN) 500 MG tablet Take 1 tablet (500 mg total) by mouth every 6 (six) hours as needed for muscle spasms. 04/22/14   Vivi Barrack, MD  midodrine (PROAMATINE) 10 MG tablet Take 1 tablet (10 mg total) by mouth 3 (three) times daily with meals. Patient not taking: Reported on 12/13/2015 06/07/15   Truman Hayward, MD  omeprazole (PRILOSEC) 40 MG capsule TAKE 1 CAPSULE BY MOUTH EVERY DAY 09/14/15   Campbell Riches, MD  ondansetron (ZOFRAN) 4 MG tablet Take 1 tablet (4 mg total) by mouth every 6 (six) hours as needed for nausea. Patient not taking: Reported on 12/13/2015 04/22/14   Vivi Barrack, MD  polyethylene glycol [facility] / Floria Raveling) packet Take 17 g by mouth daily as needed for moderate constipation or severe constipation. Patient not taking: Reported on 12/13/2015 04/22/14   Vivi Barrack, MD  sulfamethoxazole-trimethoprim (BACTRIM,SEPTRA) 400-80 MG tablet TAKE 1 TABLET BY MOUTH DAILY 02/23/16   Truman Hayward, MD  tenofovir (VIREAD) 300 MG tablet TAKE 1 TABLET BY MOUTH ONCE A WEEK ON SUNDAY 02/23/16   Truman Hayward, MD  TIVICAY 50 MG tablet TAKE 1 TABLET BY MOUTH EVERY DAY 02/23/16   Truman Hayward, MD    Family History Family History  Problem Relation Age of Onset  . Diabetes Mother   . Diabetes Father     Social History Social History  Substance Use Topics  . Smoking status: Never Smoker  . Smokeless tobacco: Never Used  . Alcohol use No     Allergies   Review of patient's allergies indicates no known allergies.   Review of Systems Review of Systems Ten systems are reviewed and are negative for acute change except as noted in the HPI   Physical Exam Updated Vital Signs BP (!) 87/54 (BP Location: Right Arm)   Pulse 106   Temp 99.1 F  (37.3 C) (Oral)   Resp 10   SpO2 97%   Physical Exam  Constitutional: He is oriented to person, place, and time. He appears well-developed and well-nourished. No distress.  HENT:  Head: Normocephalic and atraumatic.  Nose: Nose normal.  Eyes: Conjunctivae and EOM are normal. Pupils are equal, round, and reactive to light. Right eye exhibits no discharge. Left eye exhibits no discharge. No scleral icterus.  Neck: Normal range of motion. Neck supple.  Cardiovascular: Regular rhythm.  Tachycardia present.  Exam reveals no gallop and no friction rub.   No murmur heard. Pulmonary/Chest: Effort normal and breath sounds normal. No stridor. No respiratory distress. He has no rales.  Abdominal: Soft. He exhibits no distension. There is tenderness in the epigastric area. There is no rigidity, no rebound, no guarding and no CVA tenderness.  Musculoskeletal: He exhibits no edema.       Right ankle: He exhibits swelling. Tenderness.  Cervical back: He exhibits tenderness.       Back:  Left BKA nontender, intact skin.    Neurological: He is alert and oriented to person, place, and time.  Skin: Skin is warm and dry. No rash noted. He is not diaphoretic. No erythema.  Psychiatric: He has a normal mood and affect.  Vitals reviewed.    ED Treatments / Results  Labs (all labs ordered are listed, but only abnormal results are displayed) Labs Reviewed  COMPREHENSIVE METABOLIC PANEL - Abnormal; Notable for the following:       Result Value   BUN 29 (*)    Creatinine, Ser 8.36 (*)    Calcium 8.5 (*)    Albumin 2.9 (*)    ALT 9 (*)    GFR calc non Af Amer 7 (*)    GFR calc Af Amer 8 (*)    All other components within normal limits  CBC WITH DIFFERENTIAL/PLATELET - Abnormal; Notable for the following:    WBC 11.6 (*)    RBC 3.10 (*)    Hemoglobin 10.7 (*)    HCT 32.4 (*)    MCV 104.5 (*)    MCH 34.5 (*)    Neutro Abs 8.7 (*)    Monocytes Absolute 1.4 (*)    All other components  within normal limits  LACTATE DEHYDROGENASE - Abnormal; Notable for the following:    LDH 231 (*)    All other components within normal limits  I-STAT ARTERIAL BLOOD GAS, ED - Abnormal; Notable for the following:    pH, Arterial 7.460 (*)    pO2, Arterial 34.0 (*)    All other components within normal limits  CBG MONITORING, ED - Abnormal; Notable for the following:    Glucose-Capillary 105 (*)    All other components within normal limits  CULTURE, BLOOD (ROUTINE X 2)  CULTURE, BLOOD (ROUTINE X 2)  URINE CULTURE  LIPASE, BLOOD  URINALYSIS, ROUTINE W REFLEX MICROSCOPIC (NOT AT [facility])  BLOOD GAS, ARTERIAL  T-HELPER CELLS (CD4) COUNT (NOT AT [facility])  HCV RNA QUANT  PROCALCITONIN  PROCALCITONIN  LACTIC ACID, PLASMA  LACTIC ACID, PLASMA  AMYLASE  LIPASE, BLOOD  COMPREHENSIVE METABOLIC PANEL  CBC  CBC WITH DIFFERENTIAL/PLATELET  PROTIME-INR  HEPATIC FUNCTION PANEL  CBC  BASIC METABOLIC PANEL  MAGNESIUM  PHOSPHORUS  CK  TROPONIN I  TROPONIN I  TROPONIN I  LACTIC ACID, PLASMA  LACTIC ACID, PLASMA  I-STAT CG4 LACTIC ACID, ED  I-STAT CG4 LACTIC ACID, ED    EKG  EKG Interpretation  Date/Time:  Sunday March 03 2016 14:56:07 EDT Ventricular Rate:  106 PR Interval:    QRS Duration: 86 QT Interval:  331 QTC Calculation: 440 R Axis:   51 Text Interpretation:  Sinus tachycardia RSR' in V1 or V2, probably normal variant No significant change was found Confirmed by [facility] MD, Nyheim Seufert [phone]) on 03/03/2016 5:41:11 PM       Radiology Dg Chest Port 1 View  Result Date: 03/03/2016 CLINICAL DATA:  Bilateral flank pain EXAM: PORTABLE CHEST 1 VIEW COMPARISON:  02/17/2015 FINDINGS: Cardiac shadow is within normal limits. The lungs are clear bilaterally. No acute bony abnormality is seen. IMPRESSION: No active disease. Electronically Signed   By: Inez Catalina M.D.   On: 03/03/2016 14:58    Procedures Procedures (including critical care time) Emergency Focused Ultrasound  Exam Limited Ultrasound Assessment for the evaluation of Hypotension (RUSH PROTOCOL)  Performed and interpreted by Dr. Leonette Monarch Indication: Hypotension Multiple images of the  bilateral lungs, heart, inferior vena cava, abdomen, and abdominal aorta are obtained for the purposes of estimating presence/absence of pneumothorax, cardiac contractility, volume status, abdominal free fluid and aortic aneurysm with a multifrequency probe. Findings: + B lines and sliding lung, no anechoic fluid in abdomen, normal cardiac contractility, no anechoic fluid surrounding heart, no IVC collapse, no aortic dilation Interpretation: no pneumothorax, no hemoperitoneum, no pericardial effusion, no depressed CVP, noabdominal aortic aneurysm Images archived electronically.  CPT Codes: thorax [id number],  cardiac [id number], abdomen [phone], limited retroperitoneal [phone] (study includes all codes)  CRITICAL CARE Performed by: Grayce Sessions Jayko Voorhees Total critical care time: 45 minutes Critical care time was exclusive of separately billable procedures and treating other patients. Critical care was necessary to treat or prevent imminent or life-threatening deterioration. Critical care was time spent personally by me on the following activities: development of treatment plan with patient and/or surrogate as well as nursing, discussions with consultants, evaluation of patient's response to treatment, examination of patient, obtaining history from patient or surrogate, ordering and performing treatments and interventions, ordering and review of laboratory studies, ordering and review of radiographic studies, pulse oximetry and re-evaluation of patient's condition.   Medications Ordered in ED Medications  vancomycin (VANCOCIN) 500 mg in sodium chloride 0.9 % 100 mL IVPB (not administered)  piperacillin-tazobactam (ZOSYN) IVPB 3.375 g (3.375 g Intravenous Not Given 03/03/16 1643)  phenylephrine (NEO-SYNEPHRINE) 10 mg in dextrose 5 % 250  mL (0.04 mg/mL) infusion (not administered)  insulin aspart (novoLOG) injection 1-3 Units (not administered)  0.9 %  sodium chloride infusion (not administered)  heparin injection 5,000 Units (not administered)  0.9 %  sodium chloride infusion (not administered)  sodium chloride 0.9 % bolus 1,000 mL (0 mLs Intravenous Stopped 03/03/16 1519)    And  sodium chloride 0.9 % bolus 500 mL (0 mLs Intravenous Stopped 03/03/16 1523)    And  sodium chloride 0.9 % bolus 250 mL (0 mLs Intravenous Stopped 03/03/16 1552)  piperacillin-tazobactam (ZOSYN) IVPB 3.375 g (0 g Intravenous Stopped 03/03/16 1608)  vancomycin (VANCOCIN) IVPB 1000 mg/200 mL premix (0 mg Intravenous Stopped 03/03/16 1639)  ibuprofen (ADVIL,MOTRIN) tablet 600 mg (600 mg Oral Given 03/03/16 1538)  norepinephrine (LEVOPHED) 4 mg in dextrose 5 % 250 mL (0.016 mg/mL) infusion (2.5 mcg/min Intravenous Rate/Dose Change 03/03/16 1641)     Initial Impression / Assessment and Plan / ED Course  I have reviewed the triage vital signs and the nursing notes.  Pertinent labs & imaging results that were available during my care of the patient were reviewed by me and considered in my medical decision making (see chart for details).  Clinical Course    Code sepsis initiated. Patient given empiric antibiotics and started on 30 mL per Kg of IV fluid. Rush exam did not identify the source of hypotension. After 30 mL/kg of IV fluids patient's blood pressure did not respond and was started on pressors. Throughout this patient's mental status remained intact. Workup did not identify any source of infection. Patient does not make urine that she really unable to be obtained. Possible viral process. Possible hypovolemia from over dialyzing; however IVC was full on ultrasound.  Critical care consultation for further workup and management. They will admit the patient to ICU for further management.  Final Clinical Impressions(s) / ED Diagnoses   Final  diagnoses:  [facility])  Myalgia      Fatima Blank, MD 03/03/16 1745

## 2016-03-03 NOTE — ED Notes (Signed)
ER MD at bedside with [place] pt awake and alert appears in no distress remains on monitor in NSR IVF infusing

## 2016-03-03 NOTE — ED Notes (Signed)
Pt's BP at 148/64 ER MD aware titrated Levo down to 2.63mcg

## 2016-03-03 NOTE — ED Notes (Signed)
ER MD at [facility] for [place] placement

## 2016-03-03 NOTE — ED Triage Notes (Signed)
Per [facility], Pt is coming from restaurant with friends when he complained to the flank pain that started yesterday. Pt reports bilateral back pain from neck to the flank. Pt is a dialysis patient that normally goes T/Th/Sat. Pt had full treatment yesterday. Today, EMS found him to be hypotensive and tachycardic. Vitals per EMS: 70/46, 102 HR, 91 CBG. Iv 18 Gauge in the R FA with 500 cc of NS.

## 2016-03-04 ENCOUNTER — Inpatient Hospital Stay (HOSPITAL_COMMUNITY): Payer: Medicaid Other

## 2016-03-04 DIAGNOSIS — Z833 Family history of diabetes mellitus: Secondary | ICD-10-CM

## 2016-03-04 DIAGNOSIS — R1032 Left lower quadrant pain: Secondary | ICD-10-CM

## 2016-03-04 DIAGNOSIS — R6521 Severe sepsis with septic shock: Secondary | ICD-10-CM

## 2016-03-04 DIAGNOSIS — K529 Noninfective gastroenteritis and colitis, unspecified: Secondary | ICD-10-CM

## 2016-03-04 DIAGNOSIS — A419 Sepsis, unspecified organism: Principal | ICD-10-CM

## 2016-03-04 DIAGNOSIS — Z89512 Acquired absence of left leg below knee: Secondary | ICD-10-CM

## 2016-03-04 DIAGNOSIS — I951 Orthostatic hypotension: Secondary | ICD-10-CM

## 2016-03-04 DIAGNOSIS — G9340 Encephalopathy, unspecified: Secondary | ICD-10-CM

## 2016-03-04 DIAGNOSIS — R1031 Right lower quadrant pain: Secondary | ICD-10-CM

## 2016-03-04 DIAGNOSIS — Z8619 Personal history of other infectious and parasitic diseases: Secondary | ICD-10-CM

## 2016-03-04 LAB — CBC
HEMATOCRIT: 32.6 % — AB (ref 39.0–52.0)
HEMOGLOBIN: 10.7 g/dL — AB (ref 13.0–17.0)
MCH: 34.5 pg — ABNORMAL HIGH (ref 26.0–34.0)
MCHC: 32.8 g/dL (ref 30.0–36.0)
MCV: 105.2 fL — ABNORMAL HIGH (ref 78.0–100.0)
Platelets: 257 10*3/uL (ref 150–400)
RBC: 3.1 MIL/uL — ABNORMAL LOW (ref 4.22–5.81)
RDW: 14 % (ref 11.5–15.5)
WBC: 11.8 10*3/uL — AB (ref 4.0–10.5)

## 2016-03-04 LAB — BASIC METABOLIC PANEL
ANION GAP: 11 (ref 5–15)
BUN: 37 mg/dL — ABNORMAL HIGH (ref 6–20)
CALCIUM: 8.5 mg/dL — AB (ref 8.9–10.3)
CO2: 19 mmol/L — AB (ref 22–32)
CREATININE: 9.25 mg/dL — AB (ref 0.61–1.24)
Chloride: 106 mmol/L (ref 101–111)
GFR calc non Af Amer: 6 mL/min — ABNORMAL LOW (ref >60)
GFR, EST AFRICAN AMERICAN: 7 mL/min — AB (ref >60)
Glucose, Bld: 130 mg/dL — ABNORMAL HIGH (ref 65–99)
Potassium: 4.7 mmol/L (ref 3.5–5.1)
SODIUM: 136 mmol/L (ref 135–145)

## 2016-03-04 LAB — MAGNESIUM: MAGNESIUM: 2.1 mg/dL (ref 1.7–2.4)

## 2016-03-04 LAB — GLUCOSE, CAPILLARY
GLUCOSE-CAPILLARY: 203 mg/dL — AB (ref 65–99)
Glucose-Capillary: 104 mg/dL — ABNORMAL HIGH (ref 65–99)
Glucose-Capillary: 139 mg/dL — ABNORMAL HIGH (ref 65–99)
Glucose-Capillary: 168 mg/dL — ABNORMAL HIGH (ref 65–99)
Glucose-Capillary: 138 mg/dL — ABNORMAL HIGH (ref 65–99)

## 2016-03-04 LAB — RESPIRATORY PANEL BY PCR
ADENOVIRUS-RVPPCR: NOT DETECTED
Bordetella pertussis: NOT DETECTED
CORONAVIRUS NL63-RVPPCR: NOT DETECTED
CORONAVIRUS OC43-RVPPCR: NOT DETECTED
Chlamydophila pneumoniae: NOT DETECTED
Coronavirus 229E: NOT DETECTED
Coronavirus HKU1: NOT DETECTED
INFLUENZA A-RVPPCR: NOT DETECTED
INFLUENZA B-RVPPCR: NOT DETECTED
METAPNEUMOVIRUS-RVPPCR: NOT DETECTED
Mycoplasma pneumoniae: NOT DETECTED
PARAINFLUENZA VIRUS 1-RVPPCR: NOT DETECTED
PARAINFLUENZA VIRUS 2-RVPPCR: NOT DETECTED
PARAINFLUENZA VIRUS 3-RVPPCR: NOT DETECTED
PARAINFLUENZA VIRUS 4-RVPPCR: NOT DETECTED
RESPIRATORY SYNCYTIAL VIRUS-RVPPCR: NOT DETECTED
RHINOVIRUS / ENTEROVIRUS - RVPPCR: DETECTED — AB

## 2016-03-04 LAB — HEPATIC FUNCTION PANEL
ALK PHOS: 78 U/L (ref 38–126)
ALT: 10 U/L — ABNORMAL LOW (ref 17–63)
AST: 31 U/L (ref 15–41)
Albumin: 2.4 g/dL — ABNORMAL LOW (ref 3.5–5.0)
BILIRUBIN DIRECT: 0.2 mg/dL (ref 0.1–0.5)
BILIRUBIN INDIRECT: 0.9 mg/dL (ref 0.3–0.9)
TOTAL PROTEIN: 6.7 g/dL (ref 6.5–8.1)
Total Bilirubin: 1.1 mg/dL (ref 0.3–1.2)

## 2016-03-04 LAB — T-HELPER CELLS (CD4) COUNT (NOT AT ARMC)
CD4 % Helper T Cell: 11 % — ABNORMAL LOW (ref 33–55)
CD4 T Cell Abs: 180 /uL — ABNORMAL LOW (ref 400–2700)

## 2016-03-04 LAB — C DIFFICILE QUICK SCREEN W PCR REFLEX
C DIFFICILE (CDIFF) INTERP: NOT DETECTED
C DIFFICILE (CDIFF) TOXIN: NEGATIVE
C DIFFICLE (CDIFF) ANTIGEN: NEGATIVE

## 2016-03-04 LAB — PROCALCITONIN: PROCALCITONIN: 10.85 ng/mL

## 2016-03-04 LAB — TROPONIN I
TROPONIN I: 0.04 ng/mL — AB (ref <0.03)
Troponin I: 0.05 ng/mL (ref <0.03)

## 2016-03-04 LAB — HCV RNA QUANT: HCV QUANT: NOT DETECTED [IU]/mL (ref >50)

## 2016-03-04 LAB — PHOSPHORUS: PHOSPHORUS: 4.7 mg/dL — AB (ref 2.5–4.6)

## 2016-03-04 LAB — LACTIC ACID, PLASMA: Lactic Acid, Venous: 1.2 mmol/L (ref 0.5–1.9)

## 2016-03-04 MED ORDER — TENOFOVIR DISOPROXIL FUMARATE 300 MG PO TABS: 300.0000 mg | ORAL_TABLET | ORAL | Status: DC

## 2016-03-04 MED ORDER — IOPAMIDOL (ISOVUE-300) INJECTION 61%
INTRAVENOUS | Status: AC
  Filled 2016-03-04: qty 100

## 2016-03-04 MED ORDER — DARBEPOETIN ALFA 60 MCG/0.3ML IJ SOSY
60.0000 ug | PREFILLED_SYRINGE | INTRAMUSCULAR | Status: DC
  Administered 2016-03-05: 60 ug via INTRAVENOUS
  Filled 2016-03-04: qty 0.3

## 2016-03-04 MED ORDER — IOPAMIDOL (ISOVUE-300) INJECTION 61%
100.0000 mL | Freq: Once | INTRAVENOUS | Status: AC | PRN
  Administered 2016-03-04: 100 mL via INTRAVENOUS

## 2016-03-04 MED ORDER — DOLUTEGRAVIR SODIUM 50 MG PO TABS
50.0000 mg | ORAL_TABLET | Freq: Every day | ORAL | Status: DC
  Administered 2016-03-04 – 2016-03-07 (×4): 50 mg via ORAL
  Filled 2016-03-04 (×4): qty 1

## 2016-03-04 MED ORDER — LAMIVUDINE 10 MG/ML PO SOLN
50.0000 mg | Freq: Every day | ORAL | Status: DC
  Administered 2016-03-05 – 2016-03-07 (×3): 50 mg via ORAL
  Filled 2016-03-04 (×3): qty 5

## 2016-03-04 MED ORDER — RENA-VITE PO TABS
1.0000 | ORAL_TABLET | Freq: Every day | ORAL | Status: DC
  Administered 2016-03-04 – 2016-03-06 (×3): 1 via ORAL
  Filled 2016-03-04 (×3): qty 1

## 2016-03-04 MED ORDER — MIDODRINE HCL 5 MG PO TABS
10.0000 mg | ORAL_TABLET | Freq: Three times a day (TID) | ORAL | Status: DC
  Administered 2016-03-04 – 2016-03-07 (×9): 10 mg via ORAL
  Filled 2016-03-04 (×9): qty 2

## 2016-03-04 MED ORDER — DOXERCALCIFEROL 4 MCG/2ML IV SOLN
7.0000 ug | INTRAVENOUS | Status: DC
  Administered 2016-03-05 – 2016-03-07 (×2): 7 ug via INTRAVENOUS
  Filled 2016-03-04 (×2): qty 4

## 2016-03-04 MED ORDER — LAMIVUDINE 10 MG/ML PO SOLN
100.0000 mg | Freq: Every day | ORAL | Status: DC
  Administered 2016-03-04: 100 mg via ORAL
  Filled 2016-03-04: qty 10

## 2016-03-04 MED ORDER — SODIUM CHLORIDE 0.9 % IV SOLN
250.0000 mL | INTRAVENOUS | Status: DC | PRN
  Administered 2016-03-04: 250 mL via INTRAVENOUS

## 2016-03-04 MED ORDER — IOPAMIDOL (ISOVUE-300) INJECTION 61%
INTRAVENOUS | Status: AC
  Filled 2016-03-04: qty 30

## 2016-03-04 NOTE — Progress Notes (Signed)
PULMONARY / CRITICAL CARE MEDICINE   Name: Alvin Daniels MRN: [id number] DOB: Nov 23, 1970    ADMISSION DATE:  03/03/2016 CONSULTATION DATE:  03/03/2016   REFERRING MD:  Dr Tristan Schroeder of ER  CHIEF COMPLAINT:  Circulatory shock -suspected sepsis  HISTORY OF PRESENT ILLNESS:   Alvin Daniels is a 45 y.o.  male of Hispanic or Latino was diabetes end-stage renal disease on Tuesday Thursday Saturday dialysis. He is also HIV positive with last CD4 count reported as 240. History is obtained from the emergency room physician and the bedside nurse. Patient speaks very poor English and therefore I could not understand what he is trying to say. As best as I can gather last dialysis was 03/02/2016 yesterday Saturday. He did before that or after this started having fever or chills. When he presented to the ER today he reported that his dry weight from 49 kg had reduced to 42 kg postdialysis yesterday. ER physician found to him to be in chills and rigors and subjective fevers but no documented fever. Patient was also hypotensive. 3 L saline bolus was given, but patient continued to be hypotensive and needed levophed. Upon initiation of Levophed blood pressure responded quite well, and patient got tapered down to 2 mcg/m of Levophed at the time of critical care medicine evaluation. He otherwise feels fine and is communicative. Levothroid is being given by the peripheral vein.   PAST MEDICAL HISTORY :  He  has a past medical history of AIDS ([place]) (11/22/2014); Chronic diarrhea; Chronic hepatitis C without hepatic coma ([place]) (11/22/2014); Diabetic neuropathy ([place]); ESRD (end stage renal disease) on dialysis ([place]); Hepatitis C; HIV INFECTION (06/27/2010); Hypotension (06/02/2012); Metabolic bone disease (07/19/2503); MRSA infection; Normocytic anemia (06/17/2012); Pancreatitis; Pressure ulcer of BKA stump, stage 2 ([place]) (11/22/2014); Renal disorder; Severe protein-calorie malnutrition ([place]) (06/17/2012); and  Uncontrolled diabetes mellitus with complications ([place]) (3/97/6734).  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  No Known Allergies  No current facility-administered medications on file prior to encounter.    Current Outpatient Prescriptions on File Prior to Encounter  Medication Sig  . Elbasvir-Grazoprevir (ZEPATIER) 50-100 MG TABS Take 1 tablet by mouth daily.  Marland Kitchen HYDROcodone-acetaminophen (NORCO) 5-325 MG tablet Take 1 tablet by mouth every 6 (six) hours as needed. (Patient taking differently: Take 1 tablet by mouth every 6 (six) hours as needed for moderate pain. )  . lamivudine (EPIVIR) 100 MG tablet Take 1 tablet (100 mg total) by mouth daily.  . methocarbamol (ROBAXIN) 500 MG tablet Take 1 tablet (500 mg total) by mouth every 6 (six) hours as needed for muscle spasms.  Marland Kitchen omeprazole (PRILOSEC) 40 MG capsule TAKE 1 CAPSULE BY MOUTH EVERY DAY  . sulfamethoxazole-trimethoprim (BACTRIM,SEPTRA) 400-80 MG tablet TAKE 1 TABLET BY MOUTH DAILY  . tenofovir (VIREAD) 300 MG tablet TAKE 1 TABLET BY MOUTH ONCE A WEEK ON SUNDAY  . TIVICAY 50 MG tablet TAKE 1 TABLET BY MOUTH EVERY DAY  . midodrine (PROAMATINE) 10 MG tablet Take 1 tablet (10 mg total) by mouth 3 (three) times daily with meals. (Patient not taking: Reported on 03/03/2016)  . ondansetron (ZOFRAN) 4 MG tablet Take 1 tablet (4 mg total) by mouth every 6 (six) hours as needed for nausea. (Patient not taking: Reported on 03/03/2016)  . polyethylene glycol (MIRALAX / GLYCOLAX) packet Take 17 g by mouth daily as needed  for moderate constipation or severe constipation. (Patient not taking: Reported on 03/03/2016)    FAMILY HISTORY:  His indicated that his mother is alive. He indicated that his father is alive.    SOCIAL HISTORY: He  reports  that he has never smoked. He has never used smokeless tobacco. He reports that he does not drink alcohol or use drugs.  REVIEW OF SYSTEMS:   Unable to obtain 12 system because patient is critically ill and also because of language barrier   VITAL SIGNS: BP (!) 99/59   Pulse 96   Temp 98.8 F (37.1 C) (Oral)   Resp 17   Wt 123 lb 7.3 oz (56 kg)   SpO2 96%   BMI 23.33 kg/m   HEMODYNAMICS:    VENTILATOR SETTINGS: FiO2 (%):  [21 %] 21 %  INTAKE / OUTPUT: I/O last 3 completed shifts: In: 3093.3 [I.V.:1043.3; IV [id number]:3300] Out: -   Subjective: Patient reports abdominal pain improved. Denies SOB or CP. Says R antecubital IV feels hard.  PHYSICAL EXAMINATION: General:  Comfortable, pleasant in no distress, sitting up in bed Neuro:  Alert and oriented 3. Speech normal. Moves all 4 extremities HEENT:  Moist mucous membranes  Cardiovascular:  Regular rate and rhythm. No murmurs Lungs:  Clear to auscultation bilaterally. No respiratory distress Abdomen:  Soft nontender no organomegaly, +BS Musculoskeletal:  AV graft present in upper arm. L LE with BKA.  Skin:  Intact in the exposed areas, scarring across LEs  LABS:  PULMONARY  Recent Labs Lab 03/03/16 1439  PHART 7.460*  PCO2ART 36.9  PO2ART 34.0*  HCO3 26.2  TCO2 27  O2SAT 69.0    CBC  Recent Labs Lab 03/03/16 1446 03/03/16 1927 03/04/16 0616  HGB 10.7* 10.1* 10.7*  HCT 32.4* 31.1* 32.6*  WBC 11.6* 14.1* 11.8*  PLT 192 185 257    COAGULATION  Recent Labs Lab 03/03/16 1927  INR 1.37    CARDIAC    Recent Labs Lab 03/03/16 1927 03/03/16 2331 03/04/16 0616  TROPONINI <0.03 0.04* 0.05*   No results for input(s): PROBNP in the last 168 hours.   CHEMISTRY  Recent Labs Lab 03/03/16 1446 03/03/16 1927 03/04/16 0616  NA 138 137 136  K 4.0 4.4 4.7  CL 105 106 106  CO2 25 20* 19*  GLUCOSE 73 154* 130*  BUN 29* 30* 37*  CREATININE 8.36* 8.51* 9.25*  CALCIUM 8.5* 8.3* 8.5*  MG  --    --  2.1  PHOS  --   --  4.7*   Estimated Creatinine Clearance: 7.5 mL/min (by C-G formula based on SCr of 9.25 mg/dL (H)).   LIVER  Recent Labs Lab 03/03/16 1446 03/03/16 1927 03/04/16 0616  AST 27 23 31   ALT 9* 10* 10*  ALKPHOS 88 81 78  BILITOT 0.7 0.9 1.1  PROT 7.6 6.9 6.7  ALBUMIN 2.9* 2.5* 2.4*  INR  --  1.37  --      INFECTIOUS  Recent Labs Lab 03/03/16 1927 03/03/16 2221 03/03/16 2331 03/04/16 0616  LATICACIDVEN 1.0 1.2 1.2  --   PROCALCITON 5.56  --   --  10.85     ENDOCRINE CBG (last 3)   Recent Labs  03/03/16 1934 03/03/16 2316 03/04/16 0313  GLUCAP 148* 124* 104*    IMAGING x48h  - image(s) personally visualized  -   highlighted in bold Dg Chest Port 1 View  Result Date: 03/03/2016 CLINICAL DATA:  Bilateral flank pain EXAM: PORTABLE CHEST 1 VIEW COMPARISON:  02/17/2015 FINDINGS: Cardiac shadow is within normal limits. The lungs are clear bilaterally. No acute bony abnormality is seen. IMPRESSION: No active disease. Electronically Signed   By: Inez Catalina M.D.   On: 03/03/2016 14:58   Summary: 45-y/o male with diabetes, ESRD, and HIV who presents with hypotension, tachycardia and low grade fever and mild leukocytosis, concerning for shock from sepsis vs volume depletion.   ASSESSMENT / PLAN:  PULMONARY A: No acute respiratory distress P:   Monitor for intubation needs that seems to be low risk Maintaining O2 sats on RA  CARDIOVASCULAR A:  Circulatory shock despite fluid resuscitation - probably a combination of viral prodrome and also being below dry weight following dialysis 1 day prior to admission on 03/02/2016 - Status post 3 unit of fluid bolus and on continuous 50 mL/hr saline; levophed currently off at 2 mcg/m and neo-synephrine on at 30 mcg/m - Troponin leak, likely in setting of demand P:  Continue neo-synephrine to peripheral vein Hydrate carefully (consider repeat CXR) Continue to trend troponins  RENAL A:   End-stage  renal disease on Tuesday Thursday Saturday dialysis P:   Consult renal today, 03/04/2016  GASTROINTESTINAL A:   ? Gastric discomfort - resolved Lipase 22 (WNL) Amylase 119 P:   Monitor Repeat amylase PPI  HEMATOLOGIC A:   Anemia of chronic illness  P:  Monitor for decline in the ICU setting  INFECTIOUS A:   #Baseline HIV -  infectious diseases clinic  #Current - Unclear bacterial sepsis Or viral prodrome P:   Broad antibiotics-vancomycin and Zosyn Obtain RVP? ID consult Monday, 03/04/2016   ENDOCRINE A:   Diabetes  P:   ICU hyperglycemia protocol, q4h CBG checks  NEUROLOGIC A:   Intact   P:   Monitor  RASS goal: 0   FAMILY  - Updates: Dr. Chase Caller tried to update family 03/03/2016 but could not due to language barrier. Will call with interpreter 10/23.  - Inter-disciplinary family meet or Palliative Care meeting due by:03/10/2016  Admitted intensive care unit  Olene Floss, MD [place], PGY-2  03/04/2016 8:11 AM    Rush Farmer, M.D. [facility] Pulmonary/Critical Care Medicine. Pager: [phone]. After hours pager: [phone].\

## 2016-03-04 NOTE — Progress Notes (Signed)
PULMONARY / CRITICAL CARE MEDICINE   Name: Alvin Daniels MRN: [id number] DOB: 1970/10/21    ADMISSION DATE:  03/03/2016 CONSULTATION DATE:  03/03/2016   REFERRING MD:  Dr Tristan Schroeder of ER  CHIEF COMPLAINT:  Circulatory shock -suspected sepsis  HISTORY OF PRESENT ILLNESS:   Alvin Daniels is a 45 y.o.  male of Hispanic or Latino was diabetes end-stage renal disease on Tuesday Thursday Saturday dialysis. He is also HIV positive with last CD4 count reported as 240. History is obtained from the emergency room physician and the bedside nurse. Patient speaks very poor English and therefore I could not understand what he is trying to say. As best as I can gather last dialysis was 03/02/2016 yesterday Saturday. He did before that or after this started having fever or chills. When he presented to the ER today he reported that his dry weight from 49 kg had reduced to 42 kg postdialysis yesterday. ER physician found to him to be in chills and rigors and subjective fevers but no documented fever. Patient was also hypotensive. 3 L saline bolus was given, but patient continued to be hypotensive and needed levophed. Upon initiation of Levophed blood pressure responded quite well, and patient got tapered down to 2 mcg/m of Levophed at the time of critical care medicine evaluation. He otherwise feels fine and is communicative. Levothroid is being given by the peripheral vein.  VITAL SIGNS: BP 97/62 (BP Location: Right Leg)   Pulse 99   Temp 98.8 F (37.1 C) (Oral)   Resp 17   Wt 56 kg (123 lb 7.3 oz)   SpO2 100%   BMI 23.33 kg/m   HEMODYNAMICS:    VENTILATOR SETTINGS: FiO2 (%):  [21 %] 21 %  INTAKE / OUTPUT: I/O last 3 completed shifts: In: 3188.3 [I.V.:1138.3; IV [id number]:6945] Out: -   Subjective: Patient reports abdominal pain improved. Denies SOB or CP. Says R antecubital IV feels hard.  PHYSICAL EXAMINATION: General:  Comfortable, pleasant in no distress, sitting up  in bed Neuro:  Alert and oriented 3. Speech normal. Moves all 4 extremities HEENT:  Moist mucous membranes  Cardiovascular:  Regular rate and rhythm. No murmurs Lungs:  Clear to auscultation bilaterally. No respiratory distress Abdomen:  Soft nontender no organomegaly, +BS Musculoskeletal:  AV graft present in upper arm. L LE with BKA.  Skin:  Intact in the exposed areas, scarring across LEs  LABS:  PULMONARY  Recent Labs Lab 03/03/16 1439  PHART 7.460*  PCO2ART 36.9  PO2ART 34.0*  HCO3 26.2  TCO2 27  O2SAT 69.0   CBC  Recent Labs Lab 03/03/16 1446 03/03/16 1927 03/04/16 0616  HGB 10.7* 10.1* 10.7*  HCT 32.4* 31.1* 32.6*  WBC 11.6* 14.1* 11.8*  PLT 192 185 257   COAGULATION  Recent Labs Lab 03/03/16 1927  INR 1.37   CARDIAC   Recent Labs Lab 03/03/16 1927 03/03/16 2331 03/04/16 0616  TROPONINI <0.03 0.04* 0.05*   No results for input(s): PROBNP in the last 168 hours.  CHEMISTRY  Recent Labs Lab 03/03/16 1446 03/03/16 1927 03/04/16 0616  NA 138 137 136  K 4.0 4.4 4.7  CL 105 106 106  CO2 25 20* 19*  GLUCOSE 73 154* 130*  BUN 29* 30* 37*  CREATININE 8.36* 8.51* 9.25*  CALCIUM 8.5* 8.3* 8.5*  MG  --   --  2.1  PHOS  --   --  4.7*   Estimated Creatinine Clearance: 7.5 mL/min (by C-G formula based on SCr of  9.25 mg/dL (H)).  LIVER  Recent Labs Lab 03/03/16 1446 03/03/16 1927 03/04/16 0616  AST 27 23 31   ALT 9* 10* 10*  ALKPHOS 88 81 78  BILITOT 0.7 0.9 1.1  PROT 7.6 6.9 6.7  ALBUMIN 2.9* 2.5* 2.4*  INR  --  1.37  --    INFECTIOUS  Recent Labs Lab 03/03/16 1927 03/03/16 2221 03/03/16 2331 03/04/16 0616  LATICACIDVEN 1.0 1.2 1.2  --   PROCALCITON 5.56  --   --  10.85   ENDOCRINE CBG (last 3)   Recent Labs  03/03/16 2316 03/04/16 0313 03/04/16 0835  GLUCAP 124* 104* 139*    IMAGING x48h  - image(s) personally visualized  -   highlighted in bold Dg Chest Port 1 View  Result Date: 03/03/2016 CLINICAL DATA:   Bilateral flank pain EXAM: PORTABLE CHEST 1 VIEW COMPARISON:  02/17/2015 FINDINGS: Cardiac shadow is within normal limits. The lungs are clear bilaterally. No acute bony abnormality is seen. IMPRESSION: No active disease. Electronically Signed   By: Inez Catalina M.D.   On: 03/03/2016 14:58   Summary: 45-y/o male with diabetes, ESRD, and HIV who presents with hypotension, tachycardia and low grade fever and mild leukocytosis, concerning for shock from sepsis vs volume depletion.   ASSESSMENT / PLAN:  PULMONARY A: No acute respiratory distress P:   Monitor for intubation needs that seems to be low risk Maintaining O2 sats on RA PT evaluation and treatment OOB to chair  CARDIOVASCULAR A:  Circulatory shock despite fluid resuscitation - probably a combination of viral prodrome and also being below dry weight following dialysis 1 day prior to admission on 03/02/2016 - Status post 3 unit of fluid bolus and on continuous 50 mL/hr saline; levophed currently off at 2 mcg/m and neo-synephrine on at 30 mcg/m - Troponin leak, likely in setting of demand P:  Continue neo-synephrine to peripheral vein Accept low troponins, likely demand ischemia, no evidence of ischemia on EKG KVO IVF Titrate neo for map of 60 not 65 Midodrine 10 mg q8 hours  RENAL A:   End-stage renal disease on Tuesday Thursday Saturday dialysis P:   Consult renal today, 03/04/2016  GASTROINTESTINAL A:   ? Gastric discomfort - resolved Lipase 22 (WNL) Amylase 119 P:   Monitor PPI  HEMATOLOGIC A:   Anemia of chronic illness  P:  Monitor for decline in the ICU setting  INFECTIOUS A:   #Baseline HIV -  infectious diseases clinic  #Current - Unclear bacterial sepsis Or viral prodrome P:   Broad antibiotics-vancomycin and Zosyn. Respiratory viral panel sent and pending. ID consult called. Restart anti-retrovirals.  ENDOCRINE A:   Diabetes  P:   ICU hyperglycemia protocol, q4h CBG  checks  NEUROLOGIC A:   Intact   P:   Monitor  RASS goal: 0  FAMILY  - Updates: Patient updated bedside  - Inter-disciplinary family meet or Palliative Care meeting due by:03/10/2016  Will contact dialysis center to determine baseline BP.  Hold in ICU until off neo.  The patient is critically ill with multiple organ systems failure and requires high complexity decision making for assessment and support, frequent evaluation and titration of therapies, application of advanced monitoring technologies and extensive interpretation of multiple databases.   Critical Care Time devoted to patient care services described in this note is  35  Minutes. This time reflects time of care of this signee Dr Jennet Maduro. This critical care time does not reflect procedure time, or teaching time or  supervisory time of PA/NP/Med student/Med Resident etc but could involve care discussion time.  Rush Farmer, M.D. [facility] Pulmonary/Critical Care Medicine. Pager: [phone]. After hours pager: [phone].

## 2016-03-04 NOTE — Consult Note (Signed)
Reason for Consult:ESRD  Referring Physician: Mervil Wacker is an 45 y.o. male.  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 [facility]  Was on HD 1-/21, came in below dry as freq does and kept even as chronic low bps 7-100 sys.  Presented to ED on 10/22 with chills, rigors and feeling bad but no fever.  No cough, dysuria, N, V, D.  Does not check bs at home.  Not on Midodrine for low bps due to cost.  Given almost 4 L of fluid despite being within his normal bps , no ^ with fluid and placed on pressors.  No change MS. 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 Primary Nephrologist [place]. EDW 56 kg. HD Bath 2 Ca, @ K, Dialyzer 160NR, Heparin strd. Access RLA avf.  Past Medical History:  Diagnosis Date  . AIDS ([facility] 11/22/2014  . Chronic diarrhea   . Chronic hepatitis C without hepatic coma ([facility] 11/22/2014  . Diabetic neuropathy ([place]   . ESRD (end stage renal disease) on dialysis ([facility]    "TTS; don't remember street name" (05/03/2014)  . Hepatitis C   . HIV INFECTION 06/27/2010   Qualifier: Diagnosis of  By: CNickola MajorCMA ( [place], JGeni Bers   . Hypotension 06/02/2012  . Metabolic bone disease 20/0/9233 . MRSA infection   . Normocytic anemia 06/17/2012  . Pancreatitis   . Pressure ulcer of BKA stump, stage 2 ([facility] 11/22/2014  . Renal disorder   . Severe protein-calorie malnutrition ([facility] 06/17/2012  . Uncontrolled diabetes mellitus with complications ([facility] 20/11/6224  Annotation: uncontrolled Qualifier: Diagnosis of  By: CNickola MajorCMA (Deborra Medina, JGeni Bers     Past Surgical History:  Procedure Laterality Date  . AMPUTATION Left  04/20/2014   Procedure: 3rd toe amputation, 4th Toe Amputation,  5th Toe Amputation;  Surgeon: MNewt Minion MD;  [place]  Service: Orthopedics;  Laterality: Left;  . AMPUTATION Left 05/02/2014   Procedure: Midfoot Amputation;  Surgeon: MNewt Minion MD;  [place]  Service: Orthopedics;  Laterality: Left;  . AMPUTATION Left 06/17/2014   Procedure: AMPUTATION BELOW KNEE;  Surgeon: MNewt Minion MD;  [place]  Service: Orthopedics;  Laterality: Left;  . AV FISTULA PLACEMENT Left   . FOOT AMPUTATION THROUGH ANKLE Left 12/'21/2015   midfoot    Family History  Problem Relation Age of Onset  . Diabetes Mother   . Diabetes Father     Social History:  reports that he has never smoked. He has never used smokeless tobacco. He reports that he does not drink alcohol or use drugs.  Allergies: No Known Allergies  Medications:  I have reviewed the patient's current medications. Prior to Admission:  Prescriptions Prior to Admission  Medication Sig Dispense Refill Last Dose  . Elbasvir-Grazoprevir (ZEPATIER) 50-100 MG TABS Take 1 tablet by mouth daily. 28 tablet 2 03/03/2016 at Unknown time  . HYDROcodone-acetaminophen (NORCO) 5-325 MG tablet Take 1 tablet by mouth every 6 (six) hours as needed. (Patient taking differently: Take 1 tablet by mouth every 6 (six) hours as needed for moderate pain. ) 15 tablet 0 03/03/2016 at Unknown time  . lamivudine (EPIVIR) 100 MG tablet Take 1  tablet (100 mg total) by mouth daily. 30 tablet 11 03/03/2016 at Unknown time  . methocarbamol (ROBAXIN) 500 MG tablet Take 1 tablet (500 mg total) by mouth every 6 (six) hours as needed for muscle spasms. 30 tablet 0 Past Week at Unknown time  . omeprazole (PRILOSEC) 40 MG capsule TAKE 1 CAPSULE BY MOUTH EVERY DAY 90 capsule 3 03/03/2016 at Unknown time  . sulfamethoxazole-trimethoprim (BACTRIM,SEPTRA) 400-80 MG tablet TAKE 1 TABLET BY MOUTH DAILY 30 tablet 3 unknown at unknown  . tenofovir (VIREAD)  300 MG tablet TAKE 1 TABLET BY MOUTH ONCE A WEEK ON SUNDAY 5 tablet 3 03/03/2016 at Unknown time  . TIVICAY 50 MG tablet TAKE 1 TABLET BY MOUTH EVERY DAY 30 tablet 3 03/03/2016 at Unknown time  . midodrine (PROAMATINE) 10 MG tablet Take 1 tablet (10 mg total) by mouth 3 (three) times daily with meals. (Patient not taking: Reported on 03/03/2016) 90 tablet 11 Not Taking at Unknown time  . ondansetron (ZOFRAN) 4 MG tablet Take 1 tablet (4 mg total) by mouth every 6 (six) hours as needed for nausea. (Patient not taking: Reported on 03/03/2016) 20 tablet 0 Not Taking at Unknown time  . polyethylene glycol (MIRALAX / GLYCOLAX) packet Take 17 g by mouth daily as needed for moderate constipation or severe constipation. (Patient not taking: Reported on 03/03/2016) 14 each 0 Not Taking at Unknown time    Hectorol 24mg iv tiw,. Aranesp 452m iv q wk. , Venofer finishing load  Results for orders placed or performed during the hospital encounter of 03/03/16 (from the past 48 hour(s))  I-Stat arterial blood gas, ED     Status: Abnormal   Collection Time: 03/03/16  2:39 PM  Result Value Ref Range   pH, Arterial 7.460 (H) 7.350 - 7.450   pCO2 arterial 36.9 32.0 - 48.0 mmHg   pO2, Arterial 34.0 (LL) 83.0 - 108.0 mmHg   Bicarbonate 26.2 20.0 - 28.0 mmol/L   TCO2 27 0 - 100 mmol/L   O2 Saturation 69.0 %   Acid-Base Excess 2.0 0.0 - 2.0 mmol/L   Patient temperature 98.6 F    Collection site RADIAL, ALLEN'S TEST ACCEPTABLE    Drawn by Operator    Sample type ARTERIAL    Comment MD NOTIFIED, REPEAT TEST   Comprehensive metabolic panel     Status: Abnormal   Collection Time: 03/03/16  2:46 PM  Result Value Ref Range   Sodium 138 135 - 145 mmol/L   Potassium 4.0 3.5 - 5.1 mmol/L   Chloride 105 101 - 111 mmol/L   CO2 25 22 - 32 mmol/L   Glucose, Bld 73 65 - 99 mg/dL   BUN 29 (H) 6 - 20 mg/dL   Creatinine, Ser 8.36 (H) 0.61 - 1.24 mg/dL   Calcium 8.5 (L) 8.9 - 10.3 mg/dL   Total Protein 7.6 6.5 - 8.1  g/dL   Albumin 2.9 (L) 3.5 - 5.0 g/dL   AST 27 15 - 41 U/L   ALT 9 (L) 17 - 63 U/L   Alkaline Phosphatase 88 38 - 126 U/L   Total Bilirubin 0.7 0.3 - 1.2 mg/dL   GFR calc non Af Amer 7 (L) >60 mL/min   GFR calc Af Amer 8 (L) >60 mL/min    Comment: (NOTE) The eGFR has been calculated using the CKD EPI equation. This calculation has not been validated in all clinical situations. eGFR's persistently <60 mL/min signify possible Chronic Kidney Disease.    Anion gap 8  5 - 15  CBC WITH DIFFERENTIAL     Status: Abnormal   Collection Time: 03/03/16  2:46 PM  Result Value Ref Range   WBC 11.6 (H) 4.0 - 10.5 K/uL   RBC 3.10 (L) 4.22 - 5.81 MIL/uL   Hemoglobin 10.7 (L) 13.0 - 17.0 g/dL   HCT 32.4 (L) 39.0 - 52.0 %   MCV 104.5 (H) 78.0 - 100.0 fL   MCH 34.5 (H) 26.0 - 34.0 pg   MCHC 33.0 30.0 - 36.0 g/dL   RDW 13.8 11.5 - 15.5 %   Platelets 192 150 - 400 K/uL   Neutrophils Relative % 75 %   Lymphocytes Relative 12 %   Monocytes Relative 12 %   Eosinophils Relative 1 %   Basophils Relative 0 %   Neutro Abs 8.7 (H) 1.7 - 7.7 K/uL   Lymphs Abs 1.4 0.7 - 4.0 K/uL   Monocytes Absolute 1.4 (H) 0.1 - 1.0 K/uL   Eosinophils Absolute 0.1 0.0 - 0.7 K/uL   Basophils Absolute 0.0 0.0 - 0.1 K/uL   RBC Morphology POLYCHROMASIA PRESENT   Lactate dehydrogenase     Status: Abnormal   Collection Time: 03/03/16  2:46 PM  Result Value Ref Range   LDH 231 (H) 98 - 192 U/L  T-helper cells (CD4) count (not at [facility])     Status: Abnormal   Collection Time: 03/03/16  2:46 PM  Result Value Ref Range   CD4 T Cell Abs 180 (L) 400 - 2,700 /uL   CD4 % Helper T Cell 11 (L) 33 - 55 %    Comment: Performed at [facility]  Lipase, blood     Status: None   Collection Time: 03/03/16  2:46 PM  Result Value Ref Range   Lipase 18 11 - 51 U/L  I-Stat CG4 Lactic Acid, ED  (not at  [facility])     Status: None   Collection Time: 03/03/16  3:40 PM  Result Value Ref Range   Lactic Acid, Venous 1.22 0.5 -  1.9 mmol/L  CBG monitoring, ED     Status: Abnormal   Collection Time: 03/03/16  5:42 PM  Result Value Ref Range   Glucose-Capillary 105 (H) 65 - 99 mg/dL  Procalcitonin - Baseline     Status: None   Collection Time: 03/03/16  7:27 PM  Result Value Ref Range   Procalcitonin 5.56 ng/mL    Comment:        Interpretation: PCT > 2 ng/mL: Systemic infection (sepsis) is likely, unless other causes are known. (NOTE)         ICU PCT Algorithm               Non ICU PCT Algorithm    ----------------------------     ------------------------------         PCT < 0.25 ng/mL                 PCT < 0.1 ng/mL     Stopping of antibiotics            Stopping of antibiotics       strongly encouraged.               strongly encouraged.    ----------------------------     ------------------------------       PCT level decrease by               PCT < 0.25 ng/mL       >= 80%  from peak PCT       OR PCT 0.25 - 0.5 ng/mL          Stopping of antibiotics                                             encouraged.     Stopping of antibiotics           encouraged.    ----------------------------     ------------------------------       PCT level decrease by              PCT >= 0.25 ng/mL       < 80% from peak PCT        AND PCT >= 0.5 ng/mL            Continuing antibiotics                                               encouraged.       Continuing antibiotics            encouraged.    ----------------------------     ------------------------------     PCT level increase compared          PCT > 0.5 ng/mL         with peak PCT AND          PCT >= 0.5 ng/mL             Escalation of antibiotics                                          strongly encouraged.      Escalation of antibiotics        strongly encouraged.   Lactic acid, plasma     Status: None   Collection Time: 03/03/16  7:27 PM  Result Value Ref Range   Lactic Acid, Venous 1.0 0.5 - 1.9 mmol/L  Amylase     Status: Abnormal   Collection Time:  03/03/16  7:27 PM  Result Value Ref Range   Amylase 119 (H) 28 - 100 U/L  Lipase, blood     Status: None   Collection Time: 03/03/16  7:27 PM  Result Value Ref Range   Lipase 22 11 - 51 U/L  Comprehensive metabolic panel     Status: Abnormal   Collection Time: 03/03/16  7:27 PM  Result Value Ref Range   Sodium 137 135 - 145 mmol/L   Potassium 4.4 3.5 - 5.1 mmol/L   Chloride 106 101 - 111 mmol/L   CO2 20 (L) 22 - 32 mmol/L   Glucose, Bld 154 (H) 65 - 99 mg/dL   BUN 30 (H) 6 - 20 mg/dL   Creatinine, Ser 8.51 (H) 0.61 - 1.24 mg/dL   Calcium 8.3 (L) 8.9 - 10.3 mg/dL   Total Protein 6.9 6.5 - 8.1 g/dL   Albumin 2.5 (L) 3.5 - 5.0 g/dL   AST 23 15 - 41 U/L   ALT 10 (L) 17 - 63 U/L   Alkaline Phosphatase 81 38 - 126 U/L   Total Bilirubin 0.9 0.3 - 1.2  mg/dL   GFR calc non Af Amer 7 (L) >60 mL/min   GFR calc Af Amer 8 (L) >60 mL/min    Comment: (NOTE) The eGFR has been calculated using the CKD EPI equation. This calculation has not been validated in all clinical situations. eGFR's persistently <60 mL/min signify possible Chronic Kidney Disease.    Anion gap 11 5 - 15  CBC WITH DIFFERENTIAL     Status: Abnormal   Collection Time: 03/03/16  7:27 PM  Result Value Ref Range   WBC 14.1 (H) 4.0 - 10.5 K/uL   RBC 2.99 (L) 4.22 - 5.81 MIL/uL   Hemoglobin 10.1 (L) 13.0 - 17.0 g/dL   HCT 31.1 (L) 39.0 - 52.0 %   MCV 104.0 (H) 78.0 - 100.0 fL   MCH 33.8 26.0 - 34.0 pg   MCHC 32.5 30.0 - 36.0 g/dL   RDW 13.8 11.5 - 15.5 %   Platelets 185 150 - 400 K/uL   Neutrophils Relative % 73 %   Lymphocytes Relative 19 %   Monocytes Relative 8 %   Eosinophils Relative 0 %   Basophils Relative 0 %   Neutro Abs 10.3 (H) 1.7 - 7.7 K/uL   Lymphs Abs 2.7 0.7 - 4.0 K/uL   Monocytes Absolute 1.1 (H) 0.1 - 1.0 K/uL   Eosinophils Absolute 0.0 0.0 - 0.7 K/uL   Basophils Absolute 0.0 0.0 - 0.1 K/uL   WBC Morphology MILD LEFT SHIFT (1-5% METAS, OCC MYELO, OCC BANDS)   Protime-INR     Status: Abnormal    Collection Time: 03/03/16  7:27 PM  Result Value Ref Range   Prothrombin Time 17.0 (H) 11.4 - 15.2 seconds   INR 1.37   CK     Status: None   Collection Time: 03/03/16  7:27 PM  Result Value Ref Range   Total CK 83 49 - 397 U/L  Troponin I     Status: None   Collection Time: 03/03/16  7:27 PM  Result Value Ref Range   Troponin I <0.03 <0.03 ng/mL  MRSA PCR Screening     Status: None   Collection Time: 03/03/16  7:34 PM  Result Value Ref Range   MRSA by PCR NEGATIVE NEGATIVE    Comment:        The GeneXpert MRSA Assay (FDA approved for NASAL specimens only), is one component of a comprehensive MRSA colonization surveillance program. It is not intended to diagnose MRSA infection nor to guide or monitor treatment for MRSA infections.   Glucose, capillary     Status: Abnormal   Collection Time: 03/03/16  7:34 PM  Result Value Ref Range   Glucose-Capillary 148 (H) 65 - 99 mg/dL   Comment 1 Notify RN   Lactic acid, plasma     Status: None   Collection Time: 03/03/16 10:21 PM  Result Value Ref Range   Lactic Acid, Venous 1.2 0.5 - 1.9 mmol/L  Glucose, capillary     Status: Abnormal   Collection Time: 03/03/16 11:16 PM  Result Value Ref Range   Glucose-Capillary 124 (H) 65 - 99 mg/dL   Comment 1 Notify RN    Comment 2 Document in Chart   Troponin I     Status: Abnormal   Collection Time: 03/03/16 11:31 PM  Result Value Ref Range   Troponin I 0.04 (HH) <0.03 ng/mL    Comment: CRITICAL RESULT CALLED TO, READ BACK BY AND VERIFIED WITH: HAYES,C RN 03/04/2016 0034 JORDANS   Lactic acid, plasma  Status: None   Collection Time: 03/03/16 11:31 PM  Result Value Ref Range   Lactic Acid, Venous 1.2 0.5 - 1.9 mmol/L  Glucose, capillary     Status: Abnormal   Collection Time: 03/04/16  3:13 AM  Result Value Ref Range   Glucose-Capillary 104 (H) 65 - 99 mg/dL   Comment 1 Notify RN    Comment 2 Document in Chart   Procalcitonin     Status: None   Collection Time: 03/04/16   6:16 AM  Result Value Ref Range   Procalcitonin 10.85 ng/mL    Comment:        Interpretation: PCT >= 10 ng/mL: Important systemic inflammatory response, almost exclusively due to severe bacterial sepsis or septic shock. (NOTE)         ICU PCT Algorithm               Non ICU PCT Algorithm    ----------------------------     ------------------------------         PCT < 0.25 ng/mL                 PCT < 0.1 ng/mL     Stopping of antibiotics            Stopping of antibiotics       strongly encouraged.               strongly encouraged.    ----------------------------     ------------------------------       PCT level decrease by               PCT < 0.25 ng/mL       >= 80% from peak PCT       OR PCT 0.25 - 0.5 ng/mL          Stopping of antibiotics                                             encouraged.     Stopping of antibiotics           encouraged.    ----------------------------     ------------------------------       PCT level decrease by              PCT >= 0.25 ng/mL       < 80% from peak PCT        AND PCT >= 0.5 ng/mL             Continuing antibiotics                                              encouraged.       Continuing antibiotics            encouraged.    ----------------------------     ------------------------------     PCT level increase compared          PCT > 0.5 ng/mL         with peak PCT AND          PCT >= 0.5 ng/mL             Escalation of antibiotics  strongly encouraged.      Escalation of antibiotics        strongly encouraged.   Hepatic function panel     Status: Abnormal   Collection Time: 03/04/16  6:16 AM  Result Value Ref Range   Total Protein 6.7 6.5 - 8.1 g/dL   Albumin 2.4 (L) 3.5 - 5.0 g/dL   AST 31 15 - 41 U/L   ALT 10 (L) 17 - 63 U/L   Alkaline Phosphatase 78 38 - 126 U/L   Total Bilirubin 1.1 0.3 - 1.2 mg/dL   Bilirubin, Direct 0.2 0.1 - 0.5 mg/dL   Indirect Bilirubin 0.9 0.3 - 0.9 mg/dL   CBC     Status: Abnormal   Collection Time: 03/04/16  6:16 AM  Result Value Ref Range   WBC 11.8 (H) 4.0 - 10.5 K/uL   RBC 3.10 (L) 4.22 - 5.81 MIL/uL   Hemoglobin 10.7 (L) 13.0 - 17.0 g/dL   HCT 32.6 (L) 39.0 - 52.0 %   MCV 105.2 (H) 78.0 - 100.0 fL   MCH 34.5 (H) 26.0 - 34.0 pg   MCHC 32.8 30.0 - 36.0 g/dL   RDW 14.0 11.5 - 15.5 %   Platelets 257 150 - 400 K/uL  Basic metabolic panel     Status: Abnormal   Collection Time: 03/04/16  6:16 AM  Result Value Ref Range   Sodium 136 135 - 145 mmol/L   Potassium 4.7 3.5 - 5.1 mmol/L   Chloride 106 101 - 111 mmol/L   CO2 19 (L) 22 - 32 mmol/L   Glucose, Bld 130 (H) 65 - 99 mg/dL   BUN 37 (H) 6 - 20 mg/dL   Creatinine, Ser 9.25 (H) 0.61 - 1.24 mg/dL   Calcium 8.5 (L) 8.9 - 10.3 mg/dL   GFR calc non Af Amer 6 (L) >60 mL/min   GFR calc Af Amer 7 (L) >60 mL/min    Comment: (NOTE) The eGFR has been calculated using the CKD EPI equation. This calculation has not been validated in all clinical situations. eGFR's persistently <60 mL/min signify possible Chronic Kidney Disease.    Anion gap 11 5 - 15  Magnesium     Status: None   Collection Time: 03/04/16  6:16 AM  Result Value Ref Range   Magnesium 2.1 1.7 - 2.4 mg/dL  Phosphorus     Status: Abnormal   Collection Time: 03/04/16  6:16 AM  Result Value Ref Range   Phosphorus 4.7 (H) 2.5 - 4.6 mg/dL  Troponin I     Status: Abnormal   Collection Time: 03/04/16  6:16 AM  Result Value Ref Range   Troponin I 0.05 (HH) <0.03 ng/mL    Comment: CRITICAL VALUE NOTED.  VALUE IS CONSISTENT WITH PREVIOUSLY REPORTED AND CALLED VALUE.  Glucose, capillary     Status: Abnormal   Collection Time: 03/04/16  8:35 AM  Result Value Ref Range   Glucose-Capillary 139 (H) 65 - 99 mg/dL   Comment 1 Notify RN     Dg Chest Port 1 View  Result Date: 03/03/2016 CLINICAL DATA:  Bilateral flank pain EXAM: PORTABLE CHEST 1 VIEW COMPARISON:  02/17/2015 FINDINGS: Cardiac shadow is within normal limits. The  lungs are clear bilaterally. No acute bony abnormality is seen. IMPRESSION: No active disease. Electronically Signed   By: Inez Catalina M.D.   On: 03/03/2016 14:58    ROS Blood pressure (!) 88/54, pulse 88, temperature 99.5 F (37.5 C), temperature source Oral, resp. rate 17,  weight 56 kg (123 lb 7.3 oz), SpO2 96 %. Physical Exam Physical Examination: General appearance - alert, well appearing, and in no distress Mental status - alert, oriented to person, place, and time Eyes - pupils equal and reactive, extraocular eye movements intact, DM retinopathy Mouth - mucous membranes moist, pharynx normal without lesions and dental hygiene poor Neck - adenopathy noted PCL Lymphatics - posterior cervical nodes Chest - clear to auscultation, no wheezes, rales or rhonchi, symmetric air entry Heart - normal rate, regular rhythm, normal S1, S2, no murmurs, rubs, clicks or gallops, S1 and S2 normal, S4 present, systolic murmur SH6/8 at 2nd left intercostal space Abdomen - soft, liver down 4 cm, pos bs Musculoskeletal - L BKA Extremities - no pedal edema noted, LBKA, RKA AVF with aneurysms Skin - normal coloration and turgor, no rashes, no suspicious skin lesions noted  Assessment/Plan: 1 Rigors, lower bps  ? Urinary infx vs viral.  Stable , on AB 2 ESRD: with do HD in am 3 Hypotension put on Mido see if can get help to get it 4. Anemia of ESRD: fair, use ESA 5. Metabolic Bone Disease: cont Vit D 6 DM per primary 7 PVD 8 Pyuria culture pending 9 Malnutrition 10 HIV on meds P Hd, meds, mido, cultures, AB, esa.  Marvelle Caudill L 03/04/2016, 12:20 PM

## 2016-03-04 NOTE — Progress Notes (Signed)
Spoke with charge nurse at [facility] dialysis center about patient's normal BP range. She said he chronically runs low, and they have difficulty pulling fluids and usually have to bolus him instead. His SBP typically is in the low 90s prior to HD and can be as low at 60s, at which time he receives fluid boluses. He has been prescribed midodrine but cannot afford it. She also notes blood pressure fluctuation depending on whether or not patient has eaten and mentions concern of access to food.  Olene Floss, MD [place], PGY-2

## 2016-03-04 NOTE — Consult Note (Signed)
[name]  Total days of antibiotics 2        Day 2 Vanc        Day 2 Zosyn               Reason for Consult: Fever   Referring Physician: Dr. Nelda Marseille  Principal Problem:   Shock circulatory [facility]) Active Problems:   Diabetes mellitus with ESRD (end-stage renal disease) ([place])   End stage renal failure on dialysis ([place])   Chronic hepatitis C without hepatic coma ([place])   AIDS ([place])  HPI: Alvin Daniels is a 45 y.o. 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 Pain was severe to the point that he could not stand up and had to lay down to help relieve the pain. He states the back pain and chest pain resolved on its own, but he continues to have bilateral lower abdominal pain, currently at a 6/10. Other than the lower abdominal pain, he feels well and at his baseline.   He admits to chronic diarrhea for the last 3 months that consists of 3 watery bowel movements per day. He states his current loose stools are at his baseline and have not improved nor worsened. His bowel movements are not associated with pain or blood. He had one episode of nausea with vomiting yesterday which has resolved. He has a chronic non-productive cough for years which has not changed. He denies current or previous fever, chills, lightheadedness, vision changes, shortness of breath, headache or wounds on his body. He has a history of wound on his left BKA which has healed. Patient does not make urine. No recent travel.  He reports compliance with all of his HIV medications including Bactrim. He completed Zepatier 3 months after starting.   Past Medical History:  Diagnosis Date  . AIDS ([place]) 11/22/2014  . Chronic diarrhea   . Chronic  hepatitis C without hepatic coma ([place]) 11/22/2014  . Diabetic neuropathy ([place])   . ESRD (end stage renal disease) on dialysis [facility])    "TTS; don't remember street name" (05/03/2014)  . Hepatitis C   . HIV INFECTION 06/27/2010   Qualifier: Diagnosis of  By: Nickola Major CMA ( [place]), Geni Bers    . Hypotension 06/02/2012  . Metabolic bone disease 10/17/5991  . MRSA infection   . Normocytic anemia 06/17/2012  . Pancreatitis   . Pressure ulcer of BKA stump, stage 2 ([place]) 11/22/2014  . Renal disorder   . Severe protein-calorie malnutrition ([place]) 06/17/2012  . Uncontrolled diabetes mellitus with complications ([place]) 5/70/1779   Annotation: uncontrolled Qualifier: Diagnosis of  By: Nickola Major CMA ( AAMA), Geni Bers      Allergies: No Known Allergies  Current antibiotics: Vancomycin 10/22>> Zosyn 10/22>>  MEDICATIONS: . [START ON 03/05/2016] darbepoetin (ARANESP) injection - DIALYSIS  60 mcg Intravenous Q Tue-HD  . dolutegravir  50 mg Oral Daily  . [START ON 03/05/2016] doxercalciferol  7 mcg Intravenous Q T,Th,Sa-HD  . heparin  5,000 Units Subcutaneous Q8H  . insulin aspart  1-3 Units Subcutaneous Q4H  . [START ON 03/05/2016] lamiVUDine  50 mg Oral Daily  . midodrine  10 mg Oral TID WC  . multivitamin  1 tablet Oral QHS  . piperacillin-tazobactam (ZOSYN)  IV  3.375 g Intravenous Q12H  . [START ON 03/10/2016] tenofovir  300 mg Oral Weekly  . [START ON 03/05/2016] vancomycin  500 mg Intravenous Q T,Th,Sa-HD    Social History  Substance Use Topics  . Smoking status: Never Smoker  . Smokeless tobacco: Never Used  . Alcohol use No    Family History  Problem Relation Age of Onset  . Diabetes Mother   . Diabetes Father     Review of Systems: A complete ROS was negative except as per HPI.   OBJECTIVE: Vitals:   03/04/16 1415 03/04/16 1430 03/04/16 1445 03/04/16 1500  BP: 99/61 (!) 87/52 (!) 84/60 92/64  Pulse:      Resp: _0 (!) 21  Temp:      TempSrc:      SpO2:      Weight:        General: Vital signs reviewed.  Patient is well-developed and well-nourished, in no acute distress and cooperative with exam.  Head: Normocephalic and atraumatic. Eyes: EOMI, conjunctivae normal, no scleral icterus.  Neck: Supple, trachea midline, non-tender, no meningeal signs. Cardiovascular: RRR, S1 normal, S2 normal, no murmurs, gallops, or rubs. Pulmonary/Chest: Clear to auscultation bilaterally, no wheezes, rales, or rhonchi. Abdominal: Soft, mildly tender in RLQ and LLQ, non-distended, BS +, no masses, organomegaly, or guarding present.  Extremities: S/p left BKA. No evidence of wound infections on extremities. Both are warm and well perfused. Neurological: A&O x3  Skin: Warm, dry and intact. Chronic hypopigmented rash on right lower extremity.  Psychiatric: Normal mood and affect. speech and behavior is normal. Cognition and memory are normal.    LABS: Results for orders placed or performed during the hospital encounter of 03/03/16 (from the past 48 hour(s))  I-Stat arterial blood gas, ED     Status: Abnormal   Collection Time: 03/03/16  2:39 PM  Result Value Ref Range   pH, Arterial 7.460 (H) 7.350 - 7.450   pCO2 arterial 36.9 32.0 - 48.0 mmHg   pO2, Arterial 34.0 (LL) 83.0 - 108.0 mmHg   Bicarbonate 26.2 20.0 - 28.0 mmol/L   TCO2 27 0 - 100 mmol/L   O2 Saturation 69.0 %   Acid-Base Excess 2.0 0.0 - 2.0 mmol/L   Patient temperature 98.6 F    Collection site RADIAL, ALLEN'S TEST ACCEPTABLE    Drawn by Operator    Sample type ARTERIAL    Comment MD NOTIFIED, REPEAT TEST   Blood Culture (routine x 2)     Status: None (Preliminary result)   Collection Time: 03/03/16  2:45 PM  Result Value Ref Range   Specimen Description BLOOD LEFT ARM    Special Requests BOTTLES DRAWN AEROBIC AND ANAEROBIC 5CC    Culture NO GROWTH < 24 HOURS    Report Status PENDING   Comprehensive metabolic panel     Status: Abnormal   Collection Time: 03/03/16  2:46 PM  Result Value Ref Range    Sodium 138 135 - 145 mmol/L   Potassium 4.0 3.5 - 5.1 mmol/L   Chloride 105 101 - 111 mmol/L   CO2 25 22 - 32 mmol/L   Glucose, Bld 73 65 - 99 mg/dL   BUN 29 (H) 6 - 20 mg/dL   Creatinine, Ser 8.36 (H) 0.61 - 1.24 mg/dL   Calcium 8.5 (L) 8.9 - 10.3 mg/dL   Total Protein 7.6 6.5 - 8.1 g/dL   Albumin 2.9 (L) 3.5 - 5.0 g/dL   AST 27 15 - 41  U/L   ALT 9 (L) 17 - 63 U/L   Alkaline Phosphatase 88 38 - 126 U/L   Total Bilirubin 0.7 0.3 - 1.2 mg/dL   GFR calc non Af Amer 7 (L) >60 mL/min   GFR calc Af Amer 8 (L) >60 mL/min    Comment: (NOTE) The eGFR has been calculated using the CKD EPI equation. This calculation has not been validated in all clinical situations. eGFR's persistently <60 mL/min signify possible Chronic Kidney Disease.    Anion gap 8 5 - 15  CBC WITH DIFFERENTIAL     Status: Abnormal   Collection Time: 03/03/16  2:46 PM  Result Value Ref Range   WBC 11.6 (H) 4.0 - 10.5 K/uL   RBC 3.10 (L) 4.22 - 5.81 MIL/uL   Hemoglobin 10.7 (L) 13.0 - 17.0 g/dL   HCT 32.4 (L) 39.0 - 52.0 %   MCV 104.5 (H) 78.0 - 100.0 fL   MCH 34.5 (H) 26.0 - 34.0 pg   MCHC 33.0 30.0 - 36.0 g/dL   RDW 13.8 11.5 - 15.5 %   Platelets 192 150 - 400 K/uL   Neutrophils Relative % 75 %   Lymphocytes Relative 12 %   Monocytes Relative 12 %   Eosinophils Relative 1 %   Basophils Relative 0 %   Neutro Abs 8.7 (H) 1.7 - 7.7 K/uL   Lymphs Abs 1.4 0.7 - 4.0 K/uL   Monocytes Absolute 1.4 (H) 0.1 - 1.0 K/uL   Eosinophils Absolute 0.1 0.0 - 0.7 K/uL   Basophils Absolute 0.0 0.0 - 0.1 K/uL   RBC Morphology POLYCHROMASIA PRESENT   Lactate dehydrogenase     Status: Abnormal   Collection Time: 03/03/16  2:46 PM  Result Value Ref Range   LDH 231 (H) 98 - 192 U/L  T-helper cells (CD4) count (not at [facility])     Status: Abnormal   Collection Time: 03/03/16  2:46 PM  Result Value Ref Range   CD4 T Cell Abs 180 (L) 400 - 2,700 /uL   CD4 % Helper T Cell 11 (L) 33 - 55 %    Comment: Performed at [facility]  Lipase, blood     Status: None   Collection Time: 03/03/16  2:46 PM  Result Value Ref Range   Lipase 18 11 - 51 U/L  Blood Culture (routine x 2)     Status: None (Preliminary result)   Collection Time: 03/03/16  3:24 PM  Result Value Ref Range   Specimen Description BLOOD RIGHT HAND    Special Requests BOTTLES DRAWN AEROBIC ONLY 5CC    Culture NO GROWTH < 24 HOURS    Report Status PENDING   HCV RNA quant     Status: None   Collection Time: 03/03/16  3:24 PM  Result Value Ref Range   HCV Quantitative HCV Not Detected >50 IU/mL   Test Information Comment     Comment: (NOTE) The quantitative range of this assay is 15 IU/mL to 100 million IU/mL. Performed At: [facility] [address] [place], [place] [id number] Lindon Romp MD PJ:[id number]   I-Stat CG4 Lactic Acid, ED  (not at  [facility])     Status: None   Collection Time: 03/03/16  3:40 PM  Result Value Ref Range   Lactic Acid, Venous 1.22 0.5 - 1.9 mmol/L  CBG monitoring, ED     Status: Abnormal   Collection Time: 03/03/16  5:42 PM  Result Value Ref Range  Glucose-Capillary 105 (H) 65 - 99 mg/dL  Procalcitonin - Baseline     Status: None   Collection Time: 03/03/16  7:27 PM  Result Value Ref Range   Procalcitonin 5.56 ng/mL    Comment:        Interpretation: PCT > 2 ng/mL: Systemic infection (sepsis) is likely, unless other causes are known. (NOTE)         ICU PCT Algorithm               Non ICU PCT Algorithm    ----------------------------     ------------------------------         PCT < 0.25 ng/mL                 PCT < 0.1 ng/mL     Stopping of antibiotics            Stopping of antibiotics       strongly encouraged.               strongly encouraged.    ----------------------------     ------------------------------       PCT level decrease by               PCT < 0.25 ng/mL       >= 80% from peak PCT       OR PCT 0.25 - 0.5 ng/mL          Stopping of antibiotics                                              encouraged.     Stopping of antibiotics           encouraged.    ----------------------------     ------------------------------       PCT level decrease by              PCT >= 0.25 ng/mL       < 80% from peak PCT        AND PCT >= 0.5 ng/mL            Continuing antibiotics                                               encouraged.       Continuing antibiotics            encouraged.    ----------------------------     ------------------------------     PCT level increase compared          PCT > 0.5 ng/mL         with peak PCT AND          PCT >= 0.5 ng/mL             Escalation of antibiotics                                          strongly encouraged.      Escalation of antibiotics        strongly encouraged.   Lactic acid, plasma     Status: None   Collection Time: 03/03/16  7:27  PM  Result Value Ref Range   Lactic Acid, Venous 1.0 0.5 - 1.9 mmol/L  Amylase     Status: Abnormal   Collection Time: 03/03/16  7:27 PM  Result Value Ref Range   Amylase 119 (H) 28 - 100 U/L  Lipase, blood     Status: None   Collection Time: 03/03/16  7:27 PM  Result Value Ref Range   Lipase 22 11 - 51 U/L  Comprehensive metabolic panel     Status: Abnormal   Collection Time: 03/03/16  7:27 PM  Result Value Ref Range   Sodium 137 135 - 145 mmol/L   Potassium 4.4 3.5 - 5.1 mmol/L   Chloride 106 101 - 111 mmol/L   CO2 20 (L) 22 - 32 mmol/L   Glucose, Bld 154 (H) 65 - 99 mg/dL   BUN 30 (H) 6 - 20 mg/dL   Creatinine, Ser 8.51 (H) 0.61 - 1.24 mg/dL   Calcium 8.3 (L) 8.9 - 10.3 mg/dL   Total Protein 6.9 6.5 - 8.1 g/dL   Albumin 2.5 (L) 3.5 - 5.0 g/dL   AST 23 15 - 41 U/L   ALT 10 (L) 17 - 63 U/L   Alkaline Phosphatase 81 38 - 126 U/L   Total Bilirubin 0.9 0.3 - 1.2 mg/dL   GFR calc non Af Amer 7 (L) >60 mL/min   GFR calc Af Amer 8 (L) >60 mL/min    Comment: (NOTE) The eGFR has been calculated using the CKD EPI equation. This calculation has not been validated in all  clinical situations. eGFR's persistently <60 mL/min signify possible Chronic Kidney Disease.    Anion gap 11 5 - 15  CBC WITH DIFFERENTIAL     Status: Abnormal   Collection Time: 03/03/16  7:27 PM  Result Value Ref Range   WBC 14.1 (H) 4.0 - 10.5 K/uL   RBC 2.99 (L) 4.22 - 5.81 MIL/uL   Hemoglobin 10.1 (L) 13.0 - 17.0 g/dL   HCT 31.1 (L) 39.0 - 52.0 %   MCV 104.0 (H) 78.0 - 100.0 fL   MCH 33.8 26.0 - 34.0 pg   MCHC 32.5 30.0 - 36.0 g/dL   RDW 13.8 11.5 - 15.5 %   Platelets 185 150 - 400 K/uL   Neutrophils Relative % 73 %   Lymphocytes Relative 19 %   Monocytes Relative 8 %   Eosinophils Relative 0 %   Basophils Relative 0 %   Neutro Abs 10.3 (H) 1.7 - 7.7 K/uL   Lymphs Abs 2.7 0.7 - 4.0 K/uL   Monocytes Absolute 1.1 (H) 0.1 - 1.0 K/uL   Eosinophils Absolute 0.0 0.0 - 0.7 K/uL   Basophils Absolute 0.0 0.0 - 0.1 K/uL   WBC Morphology MILD LEFT SHIFT (1-5% METAS, OCC MYELO, OCC BANDS)   Protime-INR     Status: Abnormal   Collection Time: 03/03/16  7:27 PM  Result Value Ref Range   Prothrombin Time 17.0 (H) 11.4 - 15.2 seconds   INR 1.37   CK     Status: None   Collection Time: 03/03/16  7:27 PM  Result Value Ref Range   Total CK 83 49 - 397 U/L  Troponin I     Status: None   Collection Time: 03/03/16  7:27 PM  Result Value Ref Range   Troponin I <0.03 <0.03 ng/mL  MRSA PCR Screening     Status: None   Collection Time: 03/03/16  7:34 PM  Result Value Ref Range  MRSA by PCR NEGATIVE NEGATIVE    Comment:        The GeneXpert MRSA Assay (FDA approved for NASAL specimens only), is one component of a comprehensive MRSA colonization surveillance program. It is not intended to diagnose MRSA infection nor to guide or monitor treatment for MRSA infections.   Glucose, capillary     Status: Abnormal   Collection Time: 03/03/16  7:34 PM  Result Value Ref Range   Glucose-Capillary 148 (H) 65 - 99 mg/dL   Comment 1 Notify RN   Lactic acid, plasma     Status: None    Collection Time: 03/03/16 10:21 PM  Result Value Ref Range   Lactic Acid, Venous 1.2 0.5 - 1.9 mmol/L  Glucose, capillary     Status: Abnormal   Collection Time: 03/03/16 11:16 PM  Result Value Ref Range   Glucose-Capillary 124 (H) 65 - 99 mg/dL   Comment 1 Notify RN    Comment 2 Document in Chart   Troponin I     Status: Abnormal   Collection Time: 03/03/16 11:31 PM  Result Value Ref Range   Troponin I 0.04 (HH) <0.03 ng/mL    Comment: CRITICAL RESULT CALLED TO, READ BACK BY AND VERIFIED WITH: HAYES,C RN 03/04/2016 0034 JORDANS   Lactic acid, plasma     Status: None   Collection Time: 03/03/16 11:31 PM  Result Value Ref Range   Lactic Acid, Venous 1.2 0.5 - 1.9 mmol/L  Glucose, capillary     Status: Abnormal   Collection Time: 03/04/16  3:13 AM  Result Value Ref Range   Glucose-Capillary 104 (H) 65 - 99 mg/dL   Comment 1 Notify RN    Comment 2 Document in Chart   Procalcitonin     Status: None   Collection Time: 03/04/16  6:16 AM  Result Value Ref Range   Procalcitonin 10.85 ng/mL    Comment:        Interpretation: PCT >= 10 ng/mL: Important systemic inflammatory response, almost exclusively due to severe bacterial sepsis or septic shock. (NOTE)         ICU PCT Algorithm               Non ICU PCT Algorithm    ----------------------------     ------------------------------         PCT < 0.25 ng/mL                 PCT < 0.1 ng/mL     Stopping of antibiotics            Stopping of antibiotics       strongly encouraged.               strongly encouraged.    ----------------------------     ------------------------------       PCT level decrease by               PCT < 0.25 ng/mL       >= 80% from peak PCT       OR PCT 0.25 - 0.5 ng/mL          Stopping of antibiotics                                             encouraged.     Stopping of antibiotics  encouraged.    ----------------------------     ------------------------------       PCT level decrease by               PCT >= 0.25 ng/mL       < 80% from peak PCT        AND PCT >= 0.5 ng/mL             Continuing antibiotics                                              encouraged.       Continuing antibiotics            encouraged.    ----------------------------     ------------------------------     PCT level increase compared          PCT > 0.5 ng/mL         with peak PCT AND          PCT >= 0.5 ng/mL             Escalation of antibiotics                                          strongly encouraged.      Escalation of antibiotics        strongly encouraged.   Hepatic function panel     Status: Abnormal   Collection Time: 03/04/16  6:16 AM  Result Value Ref Range   Total Protein 6.7 6.5 - 8.1 g/dL   Albumin 2.4 (L) 3.5 - 5.0 g/dL   AST 31 15 - 41 U/L   ALT 10 (L) 17 - 63 U/L   Alkaline Phosphatase 78 38 - 126 U/L   Total Bilirubin 1.1 0.3 - 1.2 mg/dL   Bilirubin, Direct 0.2 0.1 - 0.5 mg/dL   Indirect Bilirubin 0.9 0.3 - 0.9 mg/dL  CBC     Status: Abnormal   Collection Time: 03/04/16  6:16 AM  Result Value Ref Range   WBC 11.8 (H) 4.0 - 10.5 K/uL   RBC 3.10 (L) 4.22 - 5.81 MIL/uL   Hemoglobin 10.7 (L) 13.0 - 17.0 g/dL   HCT 32.6 (L) 39.0 - 52.0 %   MCV 105.2 (H) 78.0 - 100.0 fL   MCH 34.5 (H) 26.0 - 34.0 pg   MCHC 32.8 30.0 - 36.0 g/dL   RDW 14.0 11.5 - 15.5 %   Platelets 257 150 - 400 K/uL  Basic metabolic panel     Status: Abnormal   Collection Time: 03/04/16  6:16 AM  Result Value Ref Range   Sodium 136 135 - 145 mmol/L   Potassium 4.7 3.5 - 5.1 mmol/L   Chloride 106 101 - 111 mmol/L   CO2 19 (L) 22 - 32 mmol/L   Glucose, Bld 130 (H) 65 - 99 mg/dL   BUN 37 (H) 6 - 20 mg/dL   Creatinine, Ser 9.25 (H) 0.61 - 1.24 mg/dL   Calcium 8.5 (L) 8.9 - 10.3 mg/dL   GFR calc non Af Amer 6 (L) >60 mL/min   GFR calc Af Amer 7 (L) >60 mL/min    Comment: (NOTE) The eGFR has been calculated using the CKD EPI equation. This calculation has not been  validated in all clinical  situations. eGFR's persistently <60 mL/min signify possible Chronic Kidney Disease.    Anion gap 11 5 - 15  Magnesium     Status: None   Collection Time: 03/04/16  6:16 AM  Result Value Ref Range   Magnesium 2.1 1.7 - 2.4 mg/dL  Phosphorus     Status: Abnormal   Collection Time: 03/04/16  6:16 AM  Result Value Ref Range   Phosphorus 4.7 (H) 2.5 - 4.6 mg/dL  Troponin I     Status: Abnormal   Collection Time: 03/04/16  6:16 AM  Result Value Ref Range   Troponin I 0.05 (HH) <0.03 ng/mL    Comment: CRITICAL VALUE NOTED.  VALUE IS CONSISTENT WITH PREVIOUSLY REPORTED AND CALLED VALUE.  Glucose, capillary     Status: Abnormal   Collection Time: 03/04/16  8:35 AM  Result Value Ref Range   Glucose-Capillary 139 (H) 65 - 99 mg/dL   Comment 1 Notify RN   Respiratory Panel by PCR     Status: Abnormal   Collection Time: 03/04/16  8:46 AM  Result Value Ref Range   Adenovirus NOT DETECTED NOT DETECTED   Coronavirus 229E NOT DETECTED NOT DETECTED   Coronavirus HKU1 NOT DETECTED NOT DETECTED   Coronavirus NL63 NOT DETECTED NOT DETECTED   Coronavirus OC43 NOT DETECTED NOT DETECTED   Metapneumovirus NOT DETECTED NOT DETECTED   Rhinovirus / Enterovirus DETECTED (A) NOT DETECTED   Influenza A NOT DETECTED NOT DETECTED   Influenza B NOT DETECTED NOT DETECTED   Parainfluenza Virus 1 NOT DETECTED NOT DETECTED   Parainfluenza Virus 2 NOT DETECTED NOT DETECTED   Parainfluenza Virus 3 NOT DETECTED NOT DETECTED   Parainfluenza Virus 4 NOT DETECTED NOT DETECTED   Respiratory Syncytial Virus NOT DETECTED NOT DETECTED   Bordetella pertussis NOT DETECTED NOT DETECTED   Chlamydophila pneumoniae NOT DETECTED NOT DETECTED   Mycoplasma pneumoniae NOT DETECTED NOT DETECTED    MICRO: BCx 1022>> NGTD  IMAGING: Dg Chest Port 1 View  Result Date: 03/03/2016 CLINICAL DATA:  Bilateral flank pain EXAM: PORTABLE CHEST 1 VIEW COMPARISON:  02/17/2015 FINDINGS: Cardiac shadow is within normal limits. The lungs  are clear bilaterally. No acute bony abnormality is seen. IMPRESSION: No active disease. Electronically Signed   By: Inez Catalina M.D.   On: 03/03/2016 14:58    Assessment/Plan:   Mr. Lukasik is a 45 yo male with PMHx of HIV/AIDS (CD4 count 180 on 10/22), ESRD on HT TTS, Chronic Hepatitis C s/p Zepatier, T2DM, and h/o left BKA who presented to the ED on 10/22 after experiencing sudden onset of diffuse back pain radiating down his spine and to his neck, sternal chest pain, and abdominal pain. He was found to be hypotensive and febrile to 100.3 with a leukocytosis to 14.1 concerning for infection.  Fever: Patient presented with back pain, chest pain and abdominal pain, all which have resolved except for bilateral lower quadrant abdominal pain. He has had one recorded fever of 100.3 on 10/22, although he denies fevers. He was hypotensive requiring pressors; however, patient is chronically hypotensive on chart review. WBC has trended 14.1 to 11.8 this morning with a left shift. Procalcitonin elevated from 5 to 10 today. Presentation is concerning for infection, although patient does not have clear symptoms to point to one source. He has lower quadrant abdominal pain and diarrhea (although chronic). We could consider abdominal CT for evaluation for colitis and other intra-abdominal pathology. LFTs normal. He also presented with severe  back pain radiating from his low spine to neck concerning for meningitis/encephalitis; however, patient is not altered, denies headache and symptoms have resolved after one day. He may have had a viral encephalitis, although it would be quick to clear. Otherwise, patient has no obvious respiratory, urinary or skin/wound complaints or findings. BCx pending and RVP shows Rhinovirus. -Trend CBC -Obtain abdominal CT imaging  -Check GI pathogen panel -Vancomycin 10/22 >> -Zosyn 10/22>> -BCx 10/22 >> pending -Consider de-escalating to Zosyn   HIV/AIDS: CD4 count 180 on 10/22.  (Last CD4 at clinic in January 2017 was 200 with HIV RNA of <20). Patient is on Tivicay 50 mg QD, Tenofovir 300 mg Qweek, and Lamivudine 100 mg QD, Bactrim QD and reports compliance.  -Continue Tenofovir -Continue Lamivudine at 25 mg QD -Continue Tivicay -Hold off on Bactrim for PCP ppx -Added on HIV RNA level  Hypotension: BPs on admission 87/54. Patient received 3L IVF and is now on neo. Although, BP could certainly be related to cardiogenic shock from underlying infection, patient has chronic hypotension on chart review. At ID follow up January 2017, BP was 60s/40s but denied symptoms. At PCP follow up in August 2017, BP was 91/53. Patient reportedly cannot afford his home midodrine.  -On neo per PCCM  Treated Hepatitis C: Patient completed 3 months of Zepatier in Spring 2017. Hep C viral load undetectable.  -CT abdomen/pelvis -Needs serial imaging as outpatient for monitoring for [place], DO PGY-3 Internal Medicine Resident Pager # [phone] 03/04/2016 3:37 PM

## 2016-03-04 NOTE — Progress Notes (Signed)
Attempted to reach patient's cousin Clearnce Leja to provide update on patient's status using [employer] [place] telephone interpreter. Was not able to reach cousin.  Olene Floss, MD [place], PGY-2

## 2016-03-04 NOTE — Progress Notes (Signed)
Receives dialysis at [facility] dialysis center.

## 2016-03-04 NOTE — Progress Notes (Signed)
CRITICAL VALUE ALERT  Critical value received: Troponin I - 0.04  Date of notification:  03/04/2016  Time of notification:  0034  Critical value read back:Yes.    Nurse who received alert:  C. Amedeo Plenty RN  MD notified (1st page):  Vaughan Browner, MD  Time of first page:  0100  MD notified (2nd page):  Time of second page:  Responding MD:  Vaughan Browner MD  Time MD responded:  0086

## 2016-03-05 ENCOUNTER — Telehealth: Payer: Self-pay

## 2016-03-05 ENCOUNTER — Inpatient Hospital Stay (HOSPITAL_COMMUNITY): Payer: Medicaid Other

## 2016-03-05 DIAGNOSIS — E1122 Type 2 diabetes mellitus with diabetic chronic kidney disease: Secondary | ICD-10-CM

## 2016-03-05 DIAGNOSIS — M869 Osteomyelitis, unspecified: Secondary | ICD-10-CM

## 2016-03-05 DIAGNOSIS — E1169 Type 2 diabetes mellitus with other specified complication: Secondary | ICD-10-CM

## 2016-03-05 DIAGNOSIS — N186 End stage renal disease: Secondary | ICD-10-CM

## 2016-03-05 DIAGNOSIS — Z992 Dependence on renal dialysis: Secondary | ICD-10-CM

## 2016-03-05 LAB — MAGNESIUM: MAGNESIUM: 2.2 mg/dL (ref 1.7–2.4)

## 2016-03-05 LAB — GLUCOSE, CAPILLARY
GLUCOSE-CAPILLARY: 143 mg/dL — AB (ref 65–99)
GLUCOSE-CAPILLARY: 92 mg/dL (ref 65–99)
Glucose-Capillary: 101 mg/dL — ABNORMAL HIGH (ref 65–99)
Glucose-Capillary: 126 mg/dL — ABNORMAL HIGH (ref 65–99)
Glucose-Capillary: 185 mg/dL — ABNORMAL HIGH (ref 65–99)

## 2016-03-05 LAB — GASTROINTESTINAL PANEL BY PCR, STOOL (REPLACES STOOL CULTURE)
ADENOVIRUS F40/41: NOT DETECTED
Astrovirus: NOT DETECTED
CAMPYLOBACTER SPECIES: NOT DETECTED
Cryptosporidium: NOT DETECTED
Cyclospora cayetanensis: NOT DETECTED
ENTEROPATHOGENIC E COLI (EPEC): NOT DETECTED
Entamoeba histolytica: NOT DETECTED
Enteroaggregative E coli (EAEC): NOT DETECTED
Enterotoxigenic E coli (ETEC): NOT DETECTED
Giardia lamblia: NOT DETECTED
Norovirus GI/GII: NOT DETECTED
PLESIMONAS SHIGELLOIDES: NOT DETECTED
ROTAVIRUS A: NOT DETECTED
SAPOVIRUS (I, II, IV, AND V): NOT DETECTED
SHIGA LIKE TOXIN PRODUCING E COLI (STEC): NOT DETECTED
SHIGELLA/ENTEROINVASIVE E COLI (EIEC): NOT DETECTED
Salmonella species: NOT DETECTED
Vibrio cholerae: NOT DETECTED
Vibrio species: NOT DETECTED
YERSINIA ENTEROCOLITICA: NOT DETECTED

## 2016-03-05 LAB — RENAL FUNCTION PANEL
ALBUMIN: 2.2 g/dL — AB (ref 3.5–5.0)
Albumin: 2.5 g/dL — ABNORMAL LOW (ref 3.5–5.0)
Anion gap: 12 (ref 5–15)
Anion gap: 14 (ref 5–15)
BUN: 46 mg/dL — ABNORMAL HIGH (ref 6–20)
BUN: 19 mg/dL (ref 6–20)
CHLORIDE: 95 mmol/L — AB (ref 101–111)
CO2: 19 mmol/L — ABNORMAL LOW (ref 22–32)
CO2: 27 mmol/L (ref 22–32)
CREATININE: 11.62 mg/dL — AB (ref 0.61–1.24)
CREATININE: 6.01 mg/dL — AB (ref 0.61–1.24)
Calcium: 8.5 mg/dL — ABNORMAL LOW (ref 8.9–10.3)
Calcium: 8.5 mg/dL — ABNORMAL LOW (ref 8.9–10.3)
Chloride: 103 mmol/L (ref 101–111)
GFR calc Af Amer: 5 mL/min — ABNORMAL LOW (ref >60)
GFR calc Af Amer: 12 mL/min — ABNORMAL LOW (ref >60)
GFR calc non Af Amer: 10 mL/min — ABNORMAL LOW (ref >60)
GFR, EST NON AFRICAN AMERICAN: 5 mL/min — AB (ref >60)
Glucose, Bld: 109 mg/dL — ABNORMAL HIGH (ref 65–99)
Glucose, Bld: 181 mg/dL — ABNORMAL HIGH (ref 65–99)
PHOSPHORUS: 4.7 mg/dL — AB (ref 2.5–4.6)
POTASSIUM: 4.3 mmol/L (ref 3.5–5.1)
Phosphorus: 4 mg/dL (ref 2.5–4.6)
Potassium: 3.7 mmol/L (ref 3.5–5.1)
Sodium: 134 mmol/L — ABNORMAL LOW (ref 135–145)
Sodium: 136 mmol/L (ref 135–145)

## 2016-03-05 LAB — CBC
HCT: 30 % — ABNORMAL LOW (ref 39.0–52.0)
HCT: 33.4 % — ABNORMAL LOW (ref 39.0–52.0)
HEMOGLOBIN: 9.9 g/dL — AB (ref 13.0–17.0)
Hemoglobin: 11.2 g/dL — ABNORMAL LOW (ref 13.0–17.0)
MCH: 33.8 pg (ref 26.0–34.0)
MCH: 34.1 pg — AB (ref 26.0–34.0)
MCHC: 33 g/dL (ref 30.0–36.0)
MCHC: 33.5 g/dL (ref 30.0–36.0)
MCV: 102.4 fL — AB (ref 78.0–100.0)
MCV: 101.8 fL — AB (ref 78.0–100.0)
PLATELETS: 198 10*3/uL (ref 150–400)
PLATELETS: 212 10*3/uL (ref 150–400)
RBC: 2.93 MIL/uL — AB (ref 4.22–5.81)
RBC: 3.28 MIL/uL — ABNORMAL LOW (ref 4.22–5.81)
RDW: 13.8 % (ref 11.5–15.5)
RDW: 13.4 % (ref 11.5–15.5)
WBC: 10.8 10*3/uL — AB (ref 4.0–10.5)
WBC: 9.8 10*3/uL (ref 4.0–10.5)

## 2016-03-05 LAB — PROCALCITONIN: Procalcitonin: 15.1 ng/mL

## 2016-03-05 LAB — HIV-1 RNA ULTRAQUANT REFLEX TO GENTYP+
HIV-1 RNA BY PCR: <20 copies/mL
HIV-1 RNA Quant, Log: UNDETERMINED log10copy/mL

## 2016-03-05 MED ORDER — LIDOCAINE HCL (PF) 1 % IJ SOLN: 5.0000 mL | INTRAMUSCULAR | Status: DC | PRN

## 2016-03-05 MED ORDER — DARBEPOETIN ALFA 60 MCG/0.3ML IJ SOSY
PREFILLED_SYRINGE | INTRAMUSCULAR | Status: AC
  Administered 2016-03-05: 60 ug via INTRAVENOUS
  Filled 2016-03-05: qty 0.3

## 2016-03-05 MED ORDER — SODIUM CHLORIDE 0.9 % IV SOLN: 100.0000 mL | INTRAVENOUS | Status: DC | PRN

## 2016-03-05 MED ORDER — ALTEPLASE 2 MG IJ SOLR: 2.0000 mg | Freq: Once | INTRAMUSCULAR | Status: DC | PRN

## 2016-03-05 MED ORDER — PENTAFLUOROPROP-TETRAFLUOROETH EX AERO: 1.0000 "application " | INHALATION_SPRAY | CUTANEOUS | Status: DC | PRN

## 2016-03-05 MED ORDER — HEPARIN SODIUM (PORCINE) 1000 UNIT/ML DIALYSIS
100.0000 [IU]/kg | INTRAMUSCULAR | Status: DC | PRN
  Filled 2016-03-05: qty 6

## 2016-03-05 MED ORDER — DOXERCALCIFEROL 4 MCG/2ML IV SOLN
INTRAVENOUS | Status: AC
  Administered 2016-03-05: 7 ug via INTRAVENOUS
  Filled 2016-03-05: qty 4

## 2016-03-05 MED ORDER — LIDOCAINE-PRILOCAINE 2.5-2.5 % EX CREA: 1.0000 "application " | TOPICAL_CREAM | CUTANEOUS | Status: DC | PRN

## 2016-03-05 MED ORDER — HEPARIN SODIUM (PORCINE) 1000 UNIT/ML DIALYSIS: 1000.0000 [IU] | INTRAMUSCULAR | Status: DC | PRN

## 2016-03-05 MED ORDER — VANCOMYCIN 50 MG/ML ORAL SOLUTION: 125.0000 mg | Freq: Four times a day (QID) | ORAL | Status: DC

## 2016-03-05 MED ORDER — INSULIN ASPART 100 UNIT/ML ~~LOC~~ SOLN
0.0000 [IU] | Freq: Three times a day (TID) | SUBCUTANEOUS | Status: DC
  Administered 2016-03-06: 2 [IU] via SUBCUTANEOUS
  Administered 2016-03-06: 3 [IU] via SUBCUTANEOUS
  Administered 2016-03-06: 1 [IU] via SUBCUTANEOUS

## 2016-03-05 NOTE — Evaluation (Signed)
Occupational Therapy Evaluation Patient Details Name: Alvin Daniels MRN: [id number] DOB: 1971-01-06 Today's Date: 03/05/2016    History of Present Illness 45 yo admitted with septic shock potentially from colitis. PMHx: Lt BKA, ESRD   Clinical Impression   Patient evaluated by Occupational Therapy with no further acute OT needs identified. All education has been completed and the patient has no further questions. Pt is at baseline level of functioning.  See below for any follow-up Occupational Therapy or equipment needs. OT is signing off. Thank you for this referral.      Follow Up Recommendations  No OT follow up;Supervision - Intermittent    Equipment Recommendations  Tub/shower bench    Recommendations for Other Services       Precautions / Restrictions Precautions Precautions: Fall Restrictions Weight Bearing Restrictions: Yes      Mobility Bed Mobility Overal bed mobility: Modified Independent                Transfers Overall transfer level: Modified independent                    Balance Overall balance assessment: Needs assistance Sitting-balance support: No upper extremity supported Sitting balance-Leahy Scale: Good     Standing balance support: Single extremity supported Standing balance-Leahy Scale: Fair                              ADL Overall ADL's : At baseline                                       General ADL Comments: requires set up/supervision due to IV and monitor.  If those were removed, he would be able to function safely at mod I level      Vision     Perception     Praxis      Pertinent Vitals/Pain Pain Assessment: No/denies pain     Hand Dominance Right   Extremity/Trunk Assessment Upper Extremity Assessment Upper Extremity Assessment: Overall WFL for tasks assessed   Lower Extremity Assessment Lower Extremity Assessment: Overall WFL for tasks assessed   Cervical  / Trunk Assessment Cervical / Trunk Assessment: Normal   Communication Communication Communication: Prefers language other than [place] Barista interpreter [phone] utilized )   Cognition Arousal/Alertness: Awake/alert Behavior During Therapy: WFL for tasks assessed/performed Overall Cognitive Status: Within Functional Limits for tasks assessed                     General Comments       Exercises       Shoulder Instructions      Home Living Family/patient expects to be discharged to:: Private residence Living Arrangements: Non-relatives/Friends Available Help at Discharge: Friend(s);Available PRN/intermittently Type of Home: Apartment Home Access: Stairs to enter Entrance Stairs-Number of Steps: 2 Entrance Stairs-Rails: None Home Layout: One level     Bathroom Shower/Tub: Teacher, early years/pre: Standard     Home Equipment: Environmental consultant - 2 wheels;Wheelchair - manual   Additional Comments: Pt reports he sits on a bucket to shower      Prior Functioning/Environment Level of Independence: Independent with assistive device(s)        Comments: Pt reports one fall ~2 mos ago on the stairs (RW wheel slipped).  pt uses RW to hop and move except WC for HD  or community        OT Problem List: Decreased strength   OT Treatment/Interventions:      OT Goals(Current goals can be found in the care plan section) Acute Rehab OT Goals Patient Stated Goal: return home OT Goal Formulation: All assessment and education complete, DC therapy  OT Frequency:     Barriers to D/C:            Co-evaluation              End of Session Equipment Utilized During Treatment: Rolling walker Nurse Communication: Mobility status  Activity Tolerance: Patient tolerated treatment well Patient left: in bed;with call bell/phone within reach;with nursing/sitter in room   Time: 1026-1046 OT Time Calculation (min): 20 min Charges:  OT General Charges $OT Visit: 1  Procedure OT Evaluation $OT Eval Low Complexity: 1 Procedure G-Codes:    Jessalyn Hinojosa M 03-12-16, 11:46 AM

## 2016-03-05 NOTE — Progress Notes (Signed)
PULMONARY / CRITICAL CARE MEDICINE   Name: Alvin Daniels MRN: [id number] DOB: Jun 02, 1970    ADMISSION DATE:  03/03/2016 CONSULTATION DATE:  03/03/2016   REFERRING MD:  Dr Tristan Schroeder of ER  CHIEF COMPLAINT:  Circulatory shock -suspected sepsis  HISTORY OF PRESENT ILLNESS:   Alvin Daniels is a 45 y.o.  male of Hispanic or Latino was diabetes end-stage renal disease on Tuesday Thursday Saturday dialysis. He is also HIV positive with last CD4 count reported as 240. History is obtained from the emergency room physician and the bedside nurse. Patient speaks very poor English and therefore I could not understand what he is trying to say. As best as I can gather last dialysis was 03/02/2016 yesterday Saturday. He did before that or after this started having fever or chills. When he presented to the ER today he reported that his dry weight from 49 kg had reduced to 42 kg postdialysis yesterday. ER physician found to him to be in chills and rigors and subjective fevers but no documented fever. Patient was also hypotensive. 3 L saline bolus was given, but patient continued to be hypotensive and needed levophed. Upon initiation of Levophed blood pressure responded quite well, and patient got tapered down to 2 mcg/m of Levophed at the time of critical care medicine evaluation. He otherwise feels fine and is communicative. Levothyroid is being given by the peripheral vein.  VITAL SIGNS: BP (!) 92/53 (BP Location: Right Leg)   Pulse 83   Temp 98.1 F (36.7 C) (Oral)   Resp 17   Wt 124 lb 12.5 oz (56.6 kg)   SpO2 95%   BMI 23.58 kg/m   HEMODYNAMICS:    VENTILATOR SETTINGS:    INTAKE / OUTPUT: I/O last 3 completed shifts: In: 2368.3 [P.O.:750; I.V.:1518.3; IV Piggyback:100] Out: -   Subjective:  Neosynephrine stopped afternoon of 10/23  Continues to have diarrhea  PHYSICAL EXAMINATION: General:  Comfortable, pleasant in no distress, sitting up in bed Neuro:  Alert and  oriented 3. Speech normal. Moves all 4 extremities HEENT:  Moist mucous membranes  Cardiovascular:  Regular rate and rhythm. No murmurs Lungs:  Clear to auscultation bilaterally. No respiratory distress Abdomen:  Soft, mildly TTP, no organomegaly, +BS Musculoskeletal:  AV graft present in upper arm. L LE with BKA. Heel soft and warm of R foot.  Skin:  Intact in the exposed areas, scarring across LEs  LABS:  PULMONARY  Recent Labs Lab 03/03/16 1439  PHART 7.460*  PCO2ART 36.9  PO2ART 34.0*  HCO3 26.2  TCO2 27  O2SAT 69.0   CBC  Recent Labs Lab 03/03/16 1927 03/04/16 0616 03/05/16 0144  HGB 10.1* 10.7* 9.9*  HCT 31.1* 32.6* 30.0*  WBC 14.1* 11.8* 10.8*  PLT 185 257 198   COAGULATION  Recent Labs Lab 03/03/16 1927  INR 1.37   CARDIAC    Recent Labs Lab 03/03/16 1927 03/03/16 2331 03/04/16 0616  TROPONINI <0.03 0.04* 0.05*   No results for input(s): PROBNP in the last 168 hours.  CHEMISTRY  Recent Labs Lab 03/03/16 1446 03/03/16 1927 03/04/16 0616 03/05/16 0144  NA 138 137 136 134*  K 4.0 4.4 4.7 4.3  CL 105 106 106 103  CO2 25 20* 19* 19*  GLUCOSE 73 154* 130* 109*  BUN 29* 30* 37* 46*  CREATININE 8.36* 8.51* 9.25* 11.62*  CALCIUM 8.5* 8.3* 8.5* 8.5*  MG  --   --  2.1 2.2  PHOS  --   --  4.7* 4.7*  Estimated Creatinine Clearance: 5.9 mL/min (by C-G formula based on SCr of 11.62 mg/dL (H)).  LIVER  Recent Labs Lab 03/03/16 1446 03/03/16 1927 03/04/16 0616 03/05/16 0144  AST 27 23 31   --   ALT 9* 10* 10*  --   ALKPHOS 88 81 78  --   BILITOT 0.7 0.9 1.1  --   PROT 7.6 6.9 6.7  --   ALBUMIN 2.9* 2.5* 2.4* 2.2*  INR  --  1.37  --   --    INFECTIOUS  Recent Labs Lab 03/03/16 1927 03/03/16 2221 03/03/16 2331 03/04/16 0616 03/05/16 0144  LATICACIDVEN 1.0 1.2 1.2  --   --   PROCALCITON 5.56  --   --  10.85 15.10   ENDOCRINE CBG (last 3)   Recent Labs  03/04/16 2320 03/05/16 0339 03/05/16 0726  GLUCAP 143* 101* 126*     IMAGING x48h  - image(s) personally visualized  -   highlighted in bold Ct Abdomen Pelvis W Contrast  Result Date: 03/05/2016 CLINICAL DATA:  Generalized abdominal pain, chronic diarrhea for 3 months. EXAM: CT ABDOMEN AND PELVIS WITH CONTRAST TECHNIQUE: Multidetector CT imaging of the abdomen and pelvis was performed using the standard protocol following bolus administration of intravenous contrast. CONTRAST:  150mL ISOVUE-300 IOPAMIDOL (ISOVUE-300) INJECTION 61% COMPARISON:  CT scan of April 19, 2013. FINDINGS: Lower chest: Mild haziness of both visualized lung bases is noted suggesting possible early edema. Hepatobiliary: No gallstones are noted. Liver appears normal. Stable dilatation of common bile duct and intrahepatic ducts is noted. Pancreas: Stable dilatation of pancreatic duct is noted. Spleen: Normal. Adrenals/Urinary Tract: Adrenal glands and kidneys appear normal. No hydronephrosis or renal obstruction is noted. Urinary bladder is decompressed. Stomach/Bowel: Severe wall thickening is seen involving the distal transverse colon, descending and sigmoid colon and rectum consistent with colitis. There is no evidence of small bowel dilatation. Vascular/Lymphatic: Atherosclerosis of abdominal aorta is noted without aneurysm formation. No significant adenopathy is noted. Reproductive: Prostate gland is unremarkable. Other: Minimal amount of free fluid is noted in the pelvis. Musculoskeletal: No significant osseous abnormality is noted. IMPRESSION: Mild haziness of both visualized lung base is noted suggesting possible early edema. Stable dilatation of common bile duct and intrahepatic ducts and pancreatic duct is noted compared to prior exam. Aortic atherosclerosis. Severe wall thickening is seen involving the distal transverse, descending and sigmoid colon as well as rectum consistent with infectious or inflammatory colitis. Minimal amount of free fluid is noted in the pelvis. Electronically Signed    By: Marijo Conception, M.D.   On: 03/05/2016 07:38   Dg Chest Port 1 View  Result Date: 03/03/2016 CLINICAL DATA:  Bilateral flank pain EXAM: PORTABLE CHEST 1 VIEW COMPARISON:  02/17/2015 FINDINGS: Cardiac shadow is within normal limits. The lungs are clear bilaterally. No acute bony abnormality is seen. IMPRESSION: No active disease. Electronically Signed   By: Inez Catalina M.D.   On: 03/03/2016 14:58   Summary: 45-y/o male with diabetes, ESRD, and HIV who presents with hypotension, tachycardia and low grade fever and mild leukocytosis, concerning for shock from sepsis vs volume depletion. Also with possible R heel abscess.   ASSESSMENT / PLAN:  PULMONARY A: No acute respiratory distress P:   Monitor for intubation needs that seems to be low risk Maintaining O2 sats on RA PT evaluation and treatment OOB to chair  CARDIOVASCULAR A:  Circulatory shock despite fluid resuscitation - UPDATE: Likely patient's baseline based on information obtained from [place] [facility] yesterday.  probably a combination of viral prodrome and also being below dry weight following dialysis 1 day prior to admission on 03/02/2016 - Status post 3 unit of fluid bolus and on continuous 50 mL/hr saline; off pressors - Troponin leak, likely in setting of demand P:  BP support with midodrine 10 mg q8h Accept low troponins, likely demand ischemia, no evidence of ischemia on EKG KVO IVF Titrate neo for map of 60 not 65  RENAL A:   End-stage renal disease on Tuesday Thursday Saturday dialysis P:   Consulted Renal, appreciate recommendations To have dialysis today  GASTROINTESTINAL A:   Gastric discomfort Lipase 22 (WNL) Amylase 119 P:   Monitor Obtain CT Abdomen pelvis --> inflammatory colitis  PPI  HEMATOLOGIC A:   Anemia of chronic illness P:  Monitor for decline in the ICU setting Use ESA  INFECTIOUS A:   #Baseline HIV -  infectious diseases clinic #Current - Unclear bacterial sepsis Or  viral prodrome Rhinovirus/Enterovirus positive on RVP Inflammatory colitis seen on abdominal Xray R heel with worsening fluctuance C diff negative CD4 T cell decreased at 180 P:   Broad antibiotics-vancomycin and Zosyn for 48-72 hours until cultures result RVP with rhinovirus/enterovirus ID consulting, appreciate recommendations Restart anti-retrovirals Consider R foot MRI Anticipate resuming OI prophylaxis with bactrim  ENDOCRINE A:   Diabetes  P:   ICU hyperglycemia protocol, q4h CBG checks  NEUROLOGIC A:   Intact   P:   Monitor  RASS goal: 0  FAMILY  - Updates: Patient updated bedside  - Inter-disciplinary family meet or Palliative Care meeting due by:03/10/2016  Olene Floss, MD [name], PGY-2  Rush Farmer, M.D. [facility]. Pager: [phone]. After hours pager: [phone].

## 2016-03-05 NOTE — Progress Notes (Signed)
Dialysis treatment completed.  1500 mL ultrafiltrated and net fluid removal 1000 mL.    Patient status unchanged. Lung sounds clear to ausculation in all fields. Generalized edema. Cardiac: NSR.  Disconnected lines and removed needles.  Pressure held for 10 minutes and band aid/gauze dressing applied.  Report given to bedside RN, Urban Gibson.

## 2016-03-05 NOTE — Progress Notes (Signed)
Pt returns from MRI. Request to use [facility] but refuses to allow assistance. Call to interpreter services to explain that assistance transferring is for his safety. Pt now refuses to use the BR all together. Pt then refuses to allow his telemetry to be put back on,explained to pt that this is per MD's order but he continues to refuse.Call to [facility] for notification. Telemetry aware. Will continue to monitor closely.

## 2016-03-05 NOTE — Evaluation (Signed)
Physical Therapy Evaluation/ Discharge Patient Details Name: Quandre Polinski MRN: [id number] DOB: December 10, 1970 Today's Date: 03/05/2016   History of Present Illness  45 yo admitted with septic shock potentially from colitis. PMHx: Lt BKA, ESRD  Clinical Impression  Pt very pleasant understanding most of the conversation in [place] but utilized interpreter phone for full conversation. Pt moves very well does not have a prosthesis and can move quickly with hopping with use of RW. Pt reports caring for himself at home with Lucianne Lei for HD and assist of friends for driving. Pt at baseline functional status with no further needs at this time. Will sign off with pt aware and agreeable.     Follow Up Recommendations No PT follow up    Equipment Recommendations  None recommended by PT    Recommendations for Other Services       Precautions / Restrictions Precautions Precautions: Fall Restrictions Weight Bearing Restrictions: Yes      Mobility  Bed Mobility Overal bed mobility: Modified Independent                Transfers Overall transfer level: Modified independent                  Ambulation/Gait Ambulation/Gait assistance: Modified independent (Device/Increase time) Ambulation Distance (Feet): 150 Feet Assistive device: Rolling walker (2 wheeled) Gait Pattern/deviations: Step-to pattern   Gait velocity interpretation: at or above normal speed for age/gender    Stairs            Wheelchair Mobility    Modified Rankin (Stroke Patients Only)       Balance                                             Pertinent Vitals/Pain Pain Assessment: No/denies pain    Home Living Family/patient expects to be discharged to:: Private residence Living Arrangements: Non-relatives/Friends Available Help at Discharge: Friend(s);Available PRN/intermittently Type of Home: Apartment Home Access: Stairs to enter   Entrance Stairs-Number of  Steps: 2 Home Layout: One level Home Equipment: Walker - 2 wheels;Wheelchair - manual      Prior Function Level of Independence: Independent with assistive device(s)         Comments: pt uses RW to hop and move except WC for HD or community     Hand Dominance        Extremity/Trunk Assessment   Upper Extremity Assessment: Overall WFL for tasks assessed           Lower Extremity Assessment: Overall WFL for tasks assessed      Cervical / Trunk Assessment: Normal  Communication   Communication: Prefers language other than English (Spanish)  Cognition Arousal/Alertness: Awake/alert Behavior During Therapy: WFL for tasks assessed/performed Overall Cognitive Status: Within Functional Limits for tasks assessed                      General Comments      Exercises     Assessment/Plan    PT Assessment Patent does not need any further PT services  PT Problem List            PT Treatment Interventions      PT Goals (Current goals can be found in the Care Plan section)  Acute Rehab PT Goals Patient Stated Goal: return home PT Goal Formulation: All assessment and education complete, DC therapy  Frequency     Barriers to discharge        Co-evaluation               End of Session   Activity Tolerance: Patient tolerated treatment well Patient left: in bed;with call bell/phone within reach Nurse Communication: Mobility status         Time: 1552-0802 PT Time Calculation (min) (ACUTE ONLY): 20 min   Charges:   PT Evaluation $PT Eval Low Complexity: 1 Procedure     PT G CodesMelford Aase 03/05/2016, 11:01 AM Elwyn Reach, [place]

## 2016-03-05 NOTE — Progress Notes (Signed)
PULMONARY / CRITICAL CARE MEDICINE   Name: Alvin Daniels MRN: [id number] DOB: [birth year]    ADMISSION DATE:  03/03/2016 CONSULTATION DATE:  03/03/2016   REFERRING MD:  Dr Tristan Schroeder of ER  CHIEF COMPLAINT:  Circulatory shock -suspected sepsis  HISTORY OF PRESENT ILLNESS:   Alvin Daniels is a 45 y.o.  male of Hispanic or Latino was diabetes end-stage renal disease on Tuesday Thursday Saturday dialysis. He is also HIV positive with last CD4 count reported as 240. History is obtained from the emergency room physician and the bedside nurse. Patient speaks very poor English and therefore I could not understand what he is trying to say. As best as I can gather last dialysis was 03/02/2016 yesterday Saturday. He did before that or after this started having fever or chills. When he presented to the ER today he reported that his dry weight from 49 kg had reduced to 42 kg postdialysis yesterday. ER physician found to him to be in chills and rigors and subjective fevers but no documented fever. Patient was also hypotensive. 3 L saline bolus was given, but patient continued to be hypotensive and needed levophed. Upon initiation of Levophed blood pressure responded quite well, and patient got tapered down to 2 mcg/m of Levophed at the time of critical care medicine evaluation. He otherwise feels fine and is communicative. Levothyroid is being given by the peripheral vein.  Subjective: No events overnight, off pressors and afebrile, no new complaints.  VITAL SIGNS: BP 106/66   Pulse 83   Temp 98.1 F (36.7 C) (Oral)   Resp 14   Wt 56.6 kg (124 lb 12.5 oz)   SpO2 95%   BMI 23.58 kg/m   HEMODYNAMICS:    VENTILATOR SETTINGS:    INTAKE / OUTPUT: I/O last 3 completed shifts: In: 2368.3 [P.O.:750; I.V.:1518.3; IV Piggyback:100] Out: -   Subjective:  Neosynephrine stopped afternoon of 10/23  Continues to have diarrhea  PHYSICAL EXAMINATION: General:  Comfortable,  pleasant in no distress, sitting up in bed Neuro:  Alert and oriented 3. Speech normal. Moves all 4 extremities HEENT:  Moist mucous membranes  Cardiovascular:  Regular rate and rhythm. No murmurs Lungs:  Clear to auscultation bilaterally. No respiratory distress Abdomen:  Soft, mildly TTP, no organomegaly, +BS Musculoskeletal:  AV graft present in upper arm. L LE with BKA. Heel soft and warm of R foot.  Skin:  Intact in the exposed areas, scarring across LEs  LABS:  PULMONARY  Recent Labs Lab 03/03/16 1439  PHART 7.460*  PCO2ART 36.9  PO2ART 34.0*  HCO3 26.2  TCO2 27  O2SAT 69.0   CBC  Recent Labs Lab 03/03/16 1927 03/04/16 0616 03/05/16 0144  HGB 10.1* 10.7* 9.9*  HCT 31.1* 32.6* 30.0*  WBC 14.1* 11.8* 10.8*  PLT 185 257 198   COAGULATION  Recent Labs Lab 03/03/16 1927  INR 1.37   CARDIAC    Recent Labs Lab 03/03/16 1927 03/03/16 2331 03/04/16 0616  TROPONINI <0.03 0.04* 0.05*   No results for input(s): PROBNP in the last 168 hours.  CHEMISTRY  Recent Labs Lab 03/03/16 1446 03/03/16 1927 03/04/16 0616 03/05/16 0144  NA 138 137 136 134*  K 4.0 4.4 4.7 4.3  CL 105 106 106 103  CO2 25 20* 19* 19*  GLUCOSE 73 154* 130* 109*  BUN 29* 30* 37* 46*  CREATININE 8.36* 8.51* 9.25* 11.62*  CALCIUM 8.5* 8.3* 8.5* 8.5*  MG  --   --  2.1 2.2  PHOS  --   --  4.7* 4.7*   Estimated Creatinine Clearance: 5.9 mL/min (by C-G formula based on SCr of 11.62 mg/dL (H)).  LIVER  Recent Labs Lab 03/03/16 1446 03/03/16 1927 03/04/16 0616 03/05/16 0144  AST 27 23 31   --   ALT 9* 10* 10*  --   ALKPHOS 88 81 78  --   BILITOT 0.7 0.9 1.1  --   PROT 7.6 6.9 6.7  --   ALBUMIN 2.9* 2.5* 2.4* 2.2*  INR  --  1.37  --   --    INFECTIOUS  Recent Labs Lab 03/03/16 1927 03/03/16 2221 03/03/16 2331 03/04/16 0616 03/05/16 0144  LATICACIDVEN 1.0 1.2 1.2  --   --   PROCALCITON 5.56  --   --  10.85 15.10   ENDOCRINE CBG (last 3)   Recent Labs   03/04/16 2320 03/05/16 0339 03/05/16 0726  GLUCAP 143* 101* 126*    IMAGING x48h  - image(s) personally visualized  -   highlighted in bold Ct Abdomen Pelvis W Contrast  Result Date: 03/05/2016 CLINICAL DATA:  Generalized abdominal pain, chronic diarrhea for 3 months. EXAM: CT ABDOMEN AND PELVIS WITH CONTRAST TECHNIQUE: Multidetector CT imaging of the abdomen and pelvis was performed using the standard protocol following bolus administration of intravenous contrast. CONTRAST:  115mL ISOVUE-300 IOPAMIDOL (ISOVUE-300) INJECTION 61% COMPARISON:  CT scan of April 19, 2013. FINDINGS: Lower chest: Mild haziness of both visualized lung bases is noted suggesting possible early edema. Hepatobiliary: No gallstones are noted. Liver appears normal. Stable dilatation of common bile duct and intrahepatic ducts is noted. Pancreas: Stable dilatation of pancreatic duct is noted. Spleen: Normal. Adrenals/Urinary Tract: Adrenal glands and kidneys appear normal. No hydronephrosis or renal obstruction is noted. Urinary bladder is decompressed. Stomach/Bowel: Severe wall thickening is seen involving the distal transverse colon, descending and sigmoid colon and rectum consistent with colitis. There is no evidence of small bowel dilatation. Vascular/Lymphatic: Atherosclerosis of abdominal aorta is noted without aneurysm formation. No significant adenopathy is noted. Reproductive: Prostate gland is unremarkable. Other: Minimal amount of free fluid is noted in the pelvis. Musculoskeletal: No significant osseous abnormality is noted. IMPRESSION: Mild haziness of both visualized lung base is noted suggesting possible early edema. Stable dilatation of common bile duct and intrahepatic ducts and pancreatic duct is noted compared to prior exam. Aortic atherosclerosis. Severe wall thickening is seen involving the distal transverse, descending and sigmoid colon as well as rectum consistent with infectious or inflammatory colitis.  Minimal amount of free fluid is noted in the pelvis. Electronically Signed   By: Marijo Conception, M.D.   On: 03/05/2016 07:38   Dg Chest Port 1 View  Result Date: 03/03/2016 CLINICAL DATA:  Bilateral flank pain EXAM: PORTABLE CHEST 1 VIEW COMPARISON:  02/17/2015 FINDINGS: Cardiac shadow is within normal limits. The lungs are clear bilaterally. No acute bony abnormality is seen. IMPRESSION: No active disease. Electronically Signed   By: Inez Catalina M.D.   On: 03/03/2016 14:58   Summary: 45-y/o male with diabetes, ESRD, and HIV who presents with hypotension, tachycardia and low grade fever and mild leukocytosis, concerning for shock from sepsis vs volume depletion. Also with possible R heel abscess.   ASSESSMENT / PLAN:  PULMONARY A: No acute respiratory distress P:   Maintaining O2 sats on RA PT evaluation and treatment OOB to chair  CARDIOVASCULAR A:  Circulatory shock despite fluid resuscitation - UPDATE: Likely patient's baseline based on information obtained from [facility] yesterday. probably a combination of viral prodrome  and also being below dry weight following dialysis 1 day prior to admission on 03/02/2016 - Status post 3 unit of fluid bolus and on continuous 50 mL/hr saline; off pressors - Troponin leak, likely in setting of demand P:  BP support with midodrine 10 mg q8h Accept low troponins, likely demand ischemia, no evidence of ischemia on EKG KVO IVF Titrate neo for map of 60 not 65  RENAL A:   End-stage renal disease on Tuesday Thursday Saturday dialysis P:   Consulted Renal, appreciate recommendations To have dialysis today  GASTROINTESTINAL A:   Gastric discomfort Lipase 22 (WNL) Amylase 119 P:   Monitor Obtain CT Abdomen pelvis --> inflammatory colitis  PPI  HEMATOLOGIC A:   Anemia of chronic illness P:  Monitor for decline in the ICU setting Use ESA  INFECTIOUS A:   #Baseline HIV -  infectious diseases clinic #Current - Unclear  bacterial sepsis Or viral prodrome Rhinovirus/Enterovirus positive on RVP Inflammatory colitis seen on abdominal Xray R heel with worsening fluctuance C diff negative CD4 T cell decreased at 180 P:   Broad antibiotics-vancomycin and Zosyn for 48-72 hours until cultures result RVP with rhinovirus/enterovirus ID consulting, appreciate recommendations Restart anti-retrovirals Consider R foot MRI Anticipate resuming OI prophylaxis with bactrim  ENDOCRINE A:   Diabetes  P:   ICU hyperglycemia protocol, q4h CBG checks  NEUROLOGIC A:   Intact   P:   Monitor  RASS goal: 0  FAMILY  - Updates: Patient updated bedside  - Inter-disciplinary family meet or Palliative Care meeting due by:03/10/2016  Olene Floss, MD [place], PGY-2  Attending Note:  45 year old with ESRD who presents with chronic hypotension and inflammatory colitis.  On exam, lungs are clear and abdominal exam is benign.  I reviewed abdominal CT myself, evidence of colitis noted.  Discussed with [facility]-MD and FPTS resident.  Hypotension: chronic  - D/C neo  - Check cortisol level in AM  - Midodrine  - Treat infection.  Osteomyelitis?  - MRI of the right foot.  DM:  - Change CBGs and ISS to AC/HS.  Deconditioning:  - PT/OT  - OOB to chair  Transfer to renal floor and care to [facility] with PCCM off 10/25  Patient seen and examined, agree with above note.  I dictated the care and orders written for this patient under my direction.  Rush Farmer, MD [phone]

## 2016-03-05 NOTE — Telephone Encounter (Signed)
Call received from Elenor Quinones, RN CM inquiring if [place] has midodrine.  As per Nicoletta Ba, [facility] the [place] does have midodrine and the cost is $10.  Update provided to Jim Desanctis, RN CM and also notified her that the patient would be able to put the charge on an account if he doesn't have the money and if his account balance is < $100 and he can pay it off when he is able. He was last seen at [facility] by Dr Jarold Song 12/13/15 with recommendation for follow up in 6 months.

## 2016-03-05 NOTE — Progress Notes (Signed)
03/05/2016 4:29 PM  Patient transported to [address] speaking in [place] about his "dinero". This RN went to bedside to speak with spanish. In [place], patient informed me that he his missing money, ID and wallet from a girl who works here".   37M AD Leanord Hawking then came to department will clear plastic bag full of money. Security alerted. Interpreter informed. Both ADs at bedside. Spanish speaking Nurse Rosine Beat at bedside.  Patient informed us in spanish that he had $6000.00 in the bag and did not want [place] to count it, he did not want to count it himself, and did not want security to take his money. Patient stated that he had counted the money this morning before Hemo and knew he had $6000.00. Then he told us that someone was coming to get the money in 30 minutes.   Explained to patient that we are not responsible for this large amount of money and we would really like to count it and place it in lock up. Patient declined again and verbalized understanding.   Leanord Hawking, AD and this AD witnessed the conversation.   Jameka Ivie [employer], RN-BC, [employer] Phone [phone]

## 2016-03-05 NOTE — Progress Notes (Signed)
Subjective: Interval History: has complaints stomach not good.  Objective: Vital signs in last 24 hours: Temp:  [98.8 F (37.1 C)-99.6 F (37.6 C)] 98.8 F (37.1 C) (10/24 0340) Pulse Rate:  [75-103] 76 (10/24 0600) Resp:  [13-23] 15 (10/24 0600) BP: (69-114)/(39-71) 78/46 (10/24 0600) SpO2:  [93 %-100 %] 93 % (10/24 0600) Weight:  [56.6 kg (124 lb 12.5 oz)] 56.6 kg (124 lb 12.5 oz) (10/24 0500) Weight change: 0.9 kg (1 lb 15.8 oz)  Intake/Output from previous day: 10/23 0701 - 10/24 0700 In: 1190.6 [P.O.:750; I.V.:390.6; IV Piggyback:50] Out: -  Intake/Output this shift: Total I/O In: 160 [I.V.:110; IV Piggyback:50] Out: -   General appearance: alert, cooperative and no distress Resp: clear to auscultation bilaterally Cardio: S1, S2 normal and systolic murmur: systolic ejection 2/6, decrescendo at 2nd left intercostal space GI: soft, nontender, pos bs Extremities: L LA avf, with aneurysms..  L BKA.  R foot [place], fluctuant R heel, not reddened  Lab Results:  Recent Labs  03/04/16 0616 03/05/16 0144  WBC 11.8* 10.8*  HGB 10.7* 9.9*  HCT 32.6* 30.0*  PLT 257 198   BMET:  Recent Labs  03/04/16 0616 03/05/16 0144  NA 136 134*  K 4.7 4.3  CL 106 103  CO2 19* 19*  GLUCOSE 130* 109*  BUN 37* 46*  CREATININE 9.25* 11.62*  CALCIUM 8.5* 8.5*   No results for input(s): PTH in the last 72 hours. Iron Studies: No results for input(s): IRON, TIBC, TRANSFERRIN, FERRITIN in the last 72 hours.  Studies/Results: Dg Chest Port 1 View  Result Date: 03/03/2016 CLINICAL DATA:  Bilateral flank pain EXAM: PORTABLE CHEST 1 VIEW COMPARISON:  02/17/2015 FINDINGS: Cardiac shadow is within normal limits. The lungs are clear bilaterally. No acute bony abnormality is seen. IMPRESSION: No active disease. Electronically Signed   By: Inez Catalina M.D.   On: 03/03/2016 14:58    I have reviewed the patient's current medications. Prior to Admission:  Prescriptions Prior to Admission   Medication Sig Dispense Refill Last Dose  . Elbasvir-Grazoprevir (ZEPATIER) 50-100 MG TABS Take 1 tablet by mouth daily. 28 tablet 2 03/03/2016 at Unknown time  . HYDROcodone-acetaminophen (NORCO) 5-325 MG tablet Take 1 tablet by mouth every 6 (six) hours as needed. (Patient taking differently: Take 1 tablet by mouth every 6 (six) hours as needed for moderate pain. ) 15 tablet 0 03/03/2016 at Unknown time  . lamivudine (EPIVIR) 100 MG tablet Take 1 tablet (100 mg total) by mouth daily. (Patient taking differently: Take 50 mg by mouth daily. ) 30 tablet 11 03/03/2016 at Unknown time  . methocarbamol (ROBAXIN) 500 MG tablet Take 1 tablet (500 mg total) by mouth every 6 (six) hours as needed for muscle spasms. 30 tablet 0 Past Week at Unknown time  . omeprazole (PRILOSEC) 40 MG capsule TAKE 1 CAPSULE BY MOUTH EVERY DAY 90 capsule 3 03/03/2016 at Unknown time  . sulfamethoxazole-trimethoprim (BACTRIM,SEPTRA) 400-80 MG tablet TAKE 1 TABLET BY MOUTH DAILY 30 tablet 3 unknown at unknown  . tenofovir (VIREAD) 300 MG tablet TAKE 1 TABLET BY MOUTH ONCE A WEEK ON SUNDAY 5 tablet 3 03/03/2016 at Unknown time  . TIVICAY 50 MG tablet TAKE 1 TABLET BY MOUTH EVERY DAY 30 tablet 3 03/03/2016 at Unknown time  . midodrine (PROAMATINE) 10 MG tablet Take 1 tablet (10 mg total) by mouth 3 (three) times daily with meals. (Patient not taking: Reported on 03/03/2016) 90 tablet 11 Not Taking at Unknown time  . ondansetron (  ZOFRAN) 4 MG tablet Take 1 tablet (4 mg total) by mouth every 6 (six) hours as needed for nausea. (Patient not taking: Reported on 03/03/2016) 20 tablet 0 Not Taking at Unknown time  . polyethylene glycol (MIRALAX / GLYCOLAX) packet Take 17 g by mouth daily as needed for moderate constipation or severe constipation. (Patient not taking: Reported on 03/03/2016) 14 each 0 Not Taking at Unknown time    Assessment/Plan: 1 ESRD for HD, Vol xs. wgt up about 3 Kg 2 Anemia on esa, some variability 3 HPTH on vit  D 4 HIV viral load P 5 ??infx  No proven source yet, Did have pyuria but not culture proven.  CT of abdm pending.  Need to consider R foot 6 DM controlled 7 PVD P HD, ??cont AB, eval R foot with plane films,  CT results    LOS: 2 days   Mariadejesus Cade L 03/05/2016,6:59 AM

## 2016-03-05 NOTE — Progress Notes (Signed)
Patient arrived to unit per bed.  Reviewed treatment plan and this RN agrees.  Report received from bedside RN, Shea Stakes.  Consent verified.  Patient A & O X 4. Lung sounds clear to ausculation in all fields. Generalized edema. Cardiac: NSR.  Prepped LLAVF with alcohol and cannulated with two 15 gauge needles.  Pulsation of blood noted.  Flushed access well with saline per protocol.  Connected and secured lines and initiated tx at 1105.  UF goal of 1500 mL and net fluid removal of 1000 mL.  Will continue to monitor.

## 2016-03-05 NOTE — Progress Notes (Signed)
Alvin Daniels for Infectious Disease    Date of Admission:  03/03/2016   Total days of antibiotics 3        Day 3 Vanc        Day 3 Zosyn   ID: Alvin Daniels is a 45 y.o. 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 Found to have fever, leukocytosis, lower abdominal pain and diarrhea.   Principal Problem:   Shock circulatory ([place]) Active Problems:   Diabetes mellitus with ESRD (end-stage renal disease) ([place])   End stage renal failure on dialysis ([name])   Chronic hepatitis C without hepatic coma ([name])   AIDS ([name])  Subjective: Patient was seen and examined this morning. He continues to have 3 loose stools per day. He denies fever, chills, and states his lower abdominal pain is resolved at this time.  Medications:  Marland Kitchen Darbepoetin Alfa      . darbepoetin (ARANESP) injection - DIALYSIS  60 mcg Intravenous Q Tue-HD  . dolutegravir  50 mg Oral Daily  . doxercalciferol      . doxercalciferol  7 mcg Intravenous Q T,Th,Sa-HD  . heparin  5,000 Units Subcutaneous Q8H  . insulin aspart  1-3 Units Subcutaneous Q4H  . lamiVUDine  50 mg Oral Daily  . midodrine  10 mg Oral TID WC  . multivitamin  1 tablet Oral QHS  . piperacillin-tazobactam (ZOSYN)  IV  3.375 g Intravenous Q12H  . [START ON 03/10/2016] tenofovir  300 mg Oral Weekly    Objective: Vitals:   03/05/16 0700 03/05/16 0715 03/05/16 0800 03/05/16 0900  BP: 100/62 (!) 95/59 (!) 92/53 106/66  Pulse: 79 81 83   Resp: 16 17 17 14   Temp:  98.1 F (36.7 C)    TempSrc:  Oral    SpO2: (!) 77% 100% 95%   Weight:       General: Vital signs reviewed.  Patient is well-developed and well-nourished, in no acute distress and cooperative with exam.  Cardiovascular: RRR, S1 normal, S2  normal, no murmurs, gallops, or rubs. Pulmonary/Chest: Inspiratory crackles in lower lung fields bilaterally, no wheezes, or rhonchi. Abdominal: Soft, nontender, non-distended, BS +, no masses, organomegaly, or guarding present.  Extremities: S/p left BKA. No evidence of wound infections on extremities. Both are warm and well perfused. Right heel soft, but without fluctuance or erythema. Neurological: A&O x3  Skin: Warm, dry and intact. Chronic hypopigmented rash on right lower extremity.  Psychiatric: Normal mood and affect. speech and behavior is normal. Cognition and memory are normal.    Lab Results  Recent Labs  03/04/16 0616 03/05/16 0144  WBC 11.8* 10.8*  HGB 10.7* 9.9*  HCT 32.6* 30.0*  NA 136 134*  K 4.7 4.3  CL 106 103  CO2 19* 19*  BUN 37* 46*  CREATININE 9.25* 11.62*   Liver Panel  Recent Labs  03/03/16 1927 03/04/16 0616 03/05/16 0144  PROT 6.9 6.7  --   ALBUMIN 2.5* 2.4* 2.2*  AST 23 31  --   ALT 10* 10*  --   ALKPHOS 81 78  --   BILITOT 0.9 1.1  --   BILIDIR  --  0.2  --   IBILI  --  0.9  --  Microbiology: -BCx 10/22 >> NGTD -GI panel pending -C. Diff negative  Current antibiotics: Vancomycin 10/22>> Zosyn 10/22>>  Studies/Results: Ct Abdomen Pelvis W Contrast  Result Date: 03/05/2016 CLINICAL DATA:  Generalized abdominal pain, chronic diarrhea for 3 months. EXAM: CT ABDOMEN AND PELVIS WITH CONTRAST TECHNIQUE: Multidetector CT imaging of the abdomen and pelvis was performed using the standard protocol following bolus administration of intravenous contrast. CONTRAST:  154mL ISOVUE-300 IOPAMIDOL (ISOVUE-300) INJECTION 61% COMPARISON:  CT scan of April 19, 2013. FINDINGS: Lower chest: Mild haziness of both visualized lung bases is noted suggesting possible early edema. Hepatobiliary: No gallstones are noted. Liver appears normal. Stable dilatation of common bile duct and intrahepatic ducts is noted. Pancreas: Stable dilatation of pancreatic duct  is noted. Spleen: Normal. Adrenals/Urinary Tract: Adrenal glands and kidneys appear normal. No hydronephrosis or renal obstruction is noted. Urinary bladder is decompressed. Stomach/Bowel: Severe wall thickening is seen involving the distal transverse colon, descending and sigmoid colon and rectum consistent with colitis. There is no evidence of small bowel dilatation. Vascular/Lymphatic: Atherosclerosis of abdominal aorta is noted without aneurysm formation. No significant adenopathy is noted. Reproductive: Prostate gland is unremarkable. Other: Minimal amount of free fluid is noted in the pelvis. Musculoskeletal: No significant osseous abnormality is noted. IMPRESSION: Mild haziness of both visualized lung base is noted suggesting possible early edema. Stable dilatation of common bile duct and intrahepatic ducts and pancreatic duct is noted compared to prior exam. Aortic atherosclerosis. Severe wall thickening is seen involving the distal transverse, descending and sigmoid colon as well as rectum consistent with infectious or inflammatory colitis. Minimal amount of free fluid is noted in the pelvis. Electronically Signed   By: Marijo Conception, M.D.   On: 03/05/2016 07:38   Dg Chest Port 1 View  Result Date: 03/03/2016 CLINICAL DATA:  Bilateral flank pain EXAM: PORTABLE CHEST 1 VIEW COMPARISON:  02/17/2015 FINDINGS: Cardiac shadow is within normal limits. The lungs are clear bilaterally. No acute bony abnormality is seen. IMPRESSION: No active disease. Electronically Signed   By: Inez Catalina M.D.   On: 03/03/2016 14:58    Assessment/Plan: Alvin Daniels is a 45 yo male with PMHx of HIV/AIDS (CD4 count 180 on 10/22), ESRD on HT TTS, Chronic Hepatitis C s/p Zepatier, T2DM, and h/o left BKA who presented to the ED on 10/22 after experiencing sudden onset of diffuse back pain radiating down his spine and to his neck, sternal chest pain, and abdominal pain. He was found to be hypotensive and febrile to 100.3  with a leukocytosis to 14.1 concerning for infection.  Colitis: Patient presented with bilateral lower quadrant abdominal pain and diarrhea in the setting of fever and leukocytosis. BCx NGTD, C. Difficile negative and GI pathogen panel pending. Lipase normal. CT abdomen showed severe wall thickening involving the distal transverse, descending and sigmoid colon as well as rectum consistent with infectious or inflammatory colitis. Minimal amount of free fluid is noted in the pelvis. Patient has been afebrile overnight with decreasing leukocytosis 14>10.  -GI pathogen panel pending -Vancomycin 10/22 >>10/24 -Zosyn 10/22>> -BCx 10/22 >> NGTD -De-escalate to Zosyn given likely GI etiology -Recommend GI consult for possible colonoscopy with biopsy given ongoing diarrhea with previously negative work up  HIV/AIDS: CD4 count 180 on 10/22.Patient is on Tivicay 50 mg QD, Tenofovir 300 mg Qweek, and Lamivudine 100 mg QD, Bactrim QD at home and reports compliance.  -Continue Tenofovir -Continue Lamivudine at 25 mg QD -Continue Tivicay -Hold off on Bactrim for PCP ppx, restart if  viral load detectable -HIV RNA level pending  Treated Hepatitis C: Patient completed 3 months of Zepatier in Spring 2017. Hep C viral load undetectable.  -Needs serial imaging as outpatient for monitoring for [place], DO PGY-3 Internal Medicine Resident Pager # [phone] 03/05/2016 10:17 AM

## 2016-03-05 NOTE — Progress Notes (Signed)
CSW consult acknowledged re: "Patient cannot afford midodrine. Patient's dialysis center has tried to find him medication assistance but has been unsuccessful." CSW has staffed with RN Case Manager for medication assistance.   CSW will provide Patient with resources for food.     Emiliano Dyer, LCSW [facility] ED/62M Clinical Social Worker [phone]

## 2016-03-05 NOTE — Progress Notes (Signed)
Pt transported to MRI. Educated via interpreter services that he was going to MRI to have his foot examined. Pt verbalizes understanding. Dorthey Sawyer, RN

## 2016-03-05 NOTE — Care Management Note (Addendum)
Case Management Note  Patient Details  Name: Alvin Daniels MRN: [id number] Date of Birth: 18-Jun-1970  Subjective/Objective:   Pt admitted with shock circulatory                   Action/Plan:  Pt is active with [facility]  - CM contacted clinic pharmacy to determine if pt can get midodrine - per CM consult.  CM will continue to follow for discharge needs   Expected Discharge Date:                  Expected Discharge Plan:     In-House Referral:     Discharge planning Services  CM Consult  Post Acute Care Choice:    Choice offered to:     DME Arranged:    DME Agency:     HH Arranged:    HH Agency:     Status of Service:  In process, will continue to follow  If discussed at Long Length of Stay Meetings, dates discussed:    Additional Comments: CM heard back from [facility] ; pt is established at [facility] last appt was in August of this year.  Liason confirmed that Midodrine is available at [facility] for $10. Pt can obtain medication assistance at [facility] along with PCP.  Post HD today pt will transfer to [facility] will communicate with [facility]E CM findings and request [facility] appt set up prior to discharge in addition to education for pt regarding clinic and medication assistance    CM spoke with pt via interpretor and pt denied going to [facility] and also denied needing assistance with medications.  Pt has documented encounter with [facility] and presumed to be active pt with clinic.   CM left multiple voicemails for clinic pharmacy.  CM consulted with [facility] and requested assistance for verification of pt status with clinic and medication assistance Maryclare Labrador, RN 03/05/2016, 10:00 AM

## 2016-03-05 NOTE — Procedures (Signed)
I was present at this session.  I have reviewed the session itself and made appropriate changes.  HD via [facility] avf.  Not stuck as ordered bp about 100.   Tehila Sokolow L 10/24/201711:32 AM

## 2016-03-06 ENCOUNTER — Telehealth (INDEPENDENT_AMBULATORY_CARE_PROVIDER_SITE_OTHER): Payer: Self-pay

## 2016-03-06 DIAGNOSIS — A09 Infectious gastroenteritis and colitis, unspecified: Secondary | ICD-10-CM

## 2016-03-06 DIAGNOSIS — S92001A Unspecified fracture of right calcaneus, initial encounter for closed fracture: Secondary | ICD-10-CM

## 2016-03-06 DIAGNOSIS — X58XXXS Exposure to other specified factors, sequela: Secondary | ICD-10-CM

## 2016-03-06 DIAGNOSIS — S92001S Unspecified fracture of right calcaneus, sequela: Secondary | ICD-10-CM

## 2016-03-06 LAB — RENAL FUNCTION PANEL
ALBUMIN: 2.3 g/dL — AB (ref 3.5–5.0)
Anion gap: 14 (ref 5–15)
BUN: 23 mg/dL — AB (ref 6–20)
CO2: 25 mmol/L (ref 22–32)
Calcium: 8.3 mg/dL — ABNORMAL LOW (ref 8.9–10.3)
Chloride: 97 mmol/L — ABNORMAL LOW (ref 101–111)
Creatinine, Ser: 7.13 mg/dL — ABNORMAL HIGH (ref 0.61–1.24)
GFR, EST AFRICAN AMERICAN: 10 mL/min — AB (ref >60)
GFR, EST NON AFRICAN AMERICAN: 8 mL/min — AB (ref >60)
Glucose, Bld: 172 mg/dL — ABNORMAL HIGH (ref 65–99)
PHOSPHORUS: 5.1 mg/dL — AB (ref 2.5–4.6)
POTASSIUM: 3.4 mmol/L — AB (ref 3.5–5.1)
Sodium: 136 mmol/L (ref 135–145)

## 2016-03-06 LAB — GLUCOSE, CAPILLARY
GLUCOSE-CAPILLARY: 167 mg/dL — AB (ref 65–99)
GLUCOSE-CAPILLARY: 213 mg/dL — AB (ref 65–99)
GLUCOSE-CAPILLARY: 150 mg/dL — AB (ref 65–99)

## 2016-03-06 LAB — CBC
HCT: 31 % — ABNORMAL LOW (ref 39.0–52.0)
HEMOGLOBIN: 10.4 g/dL — AB (ref 13.0–17.0)
MCH: 34 pg (ref 26.0–34.0)
MCHC: 33.5 g/dL (ref 30.0–36.0)
MCV: 101.3 fL — ABNORMAL HIGH (ref 78.0–100.0)
Platelets: 218 10*3/uL (ref 150–400)
RBC: 3.06 MIL/uL — ABNORMAL LOW (ref 4.22–5.81)
RDW: 13.5 % (ref 11.5–15.5)
WBC: 8.5 10*3/uL (ref 4.0–10.5)

## 2016-03-06 LAB — MAGNESIUM: MAGNESIUM: 2.2 mg/dL (ref 1.7–2.4)

## 2016-03-06 LAB — CORTISOL-AM, BLOOD: CORTISOL - AM: 13.4 ug/dL (ref 6.7–22.6)

## 2016-03-06 MED ORDER — AMOXICILLIN-POT CLAVULANATE 500-125 MG PO TABS: 1.0000 | ORAL_TABLET | Freq: Every day | ORAL | 0 refills | Status: DC

## 2016-03-06 MED ORDER — PRO-STAT SUGAR FREE PO LIQD
30.0000 mL | Freq: Two times a day (BID) | ORAL | Status: DC
  Administered 2016-03-06 – 2016-03-07 (×3): 30 mL via ORAL
  Filled 2016-03-06 (×3): qty 30

## 2016-03-06 MED ORDER — ACETAMINOPHEN 325 MG PO TABS
650.0000 mg | ORAL_TABLET | Freq: Four times a day (QID) | ORAL | Status: DC | PRN
  Administered 2016-03-06: 650 mg via ORAL
  Filled 2016-03-06: qty 2

## 2016-03-06 MED ORDER — RENA-VITE PO TABS: 1.0000 | ORAL_TABLET | Freq: Every day | ORAL | 0 refills | Status: DC

## 2016-03-06 MED ORDER — LAMIVUDINE 100 MG PO TABS: 50.0000 mg | ORAL_TABLET | Freq: Every day | ORAL | Status: DC

## 2016-03-06 MED ORDER — MIDODRINE HCL 10 MG PO TABS: 10.0000 mg | ORAL_TABLET | Freq: Three times a day (TID) | ORAL | 0 refills | Status: DC

## 2016-03-06 MED ORDER — AMOXICILLIN-POT CLAVULANATE 500-125 MG PO TABS
1.0000 | ORAL_TABLET | Freq: Every day | ORAL | Status: DC
  Administered 2016-03-06 – 2016-03-07 (×2): 500 mg via ORAL
  Filled 2016-03-06 (×2): qty 1

## 2016-03-06 NOTE — Care Management Note (Signed)
Case Management Note  Patient Details  Name: Alvin Daniels MRN: [id number] Date of Birth: [birth year]  Subjective/Objective:         CM following for progression and d/c planning.            Action/Plan: 03/06/2016 Noted that pt can purchase Midodrine for $10 at the [facility] at the time of d/c. This pt is active at that center and can actually place the cost on his account if his balance is <$100.    Expected Discharge Date:                  Expected Discharge Plan:  Home/Self Care  In-House Referral:  NA  Discharge planning Services  CM Consult, Medication Assistance  Post Acute Care Choice:    Choice offered to:     DME Arranged:    DME Agency:     HH Arranged:    HH Agency:     Status of Service:  Completed, signed off  If discussed at [employer] of Stay Meetings, dates discussed:    Additional Comments:  Adron Bene, RN 03/06/2016, 10:51 AM

## 2016-03-06 NOTE — Progress Notes (Signed)
[facility] Progress Note  Dialysis Orders:  [facility] TTS 4 h 2/2 400/800 EDW 56 (will have new EDW at d/c) Aranesp 25 mcg IV q week Hectorol 7 mcg IV q treatment  Venofer 50mg  IV q week    Assessment/Plan: 1. Inflammatory colitis - per abdominal CT - ID following on [place] IV - GI panel/C. Diff neg BC x2 NGTD 2. ESRD - TTS schedule K 3.4 Reflects post HD 3. Anemia - Hgb 10.4 Aranesp 66mcg on 10/24 4. Secondary hyperparathyroidism - Ca 8.3 P 5.1 5. HTN/volume - Chronic hypotension on midodrine/ For HD tomorrow UF goal 2L Post HD wt 55.6kg  6. Nutrition - Albumin 2.3 - renal diet/protein supplement  7. Right foot pain/swelling  - MRI with ununited calcaneal fx - no abscess ? Osteo 8. HIV - on HAART  9. DM 10 PVD 11 UTI  Alvin Larina Earthly PA-C [facility] Pager [phone] 03/06/2016,9:51 AM  LOS: 3 days  I have seen and examined this patient and agree with the plan of care seen,examined, eval, discussed with PA, changes made. .  Alvin Daniels 03/06/2016, 10:39 AM   Subjective:  Tolerated HD yesterday w/o complaints. Seen sitting up in bed says stomach is "ok"   Objective Vitals:   03/05/16 1614 03/05/16 1845 03/05/16 2103 03/06/16 0523  BP: (!) 85/54 (!) 142/91 (!) 83/50 (!) 89/47  Pulse: 71 88 85 88  Resp: 19 17 17 17   Temp: 98.6 F (37 C) 98.3 F (36.8 C) 98.5 F (36.9 C) 99.6 F (37.6 C)  TempSrc: Oral Oral Oral Oral  SpO2: 94% 100% 100% 99%  Weight:   55.8 kg (123 lb 0.3 oz)    Physical Exam General: WNWD Hispanic male Alert sitting upright in bed NAD  Heart: RRR Gr2/6 holosys M Lungs: CTAB  Abdomen: soft NT ND pos bs Extremities: no LE edema Daniels BKA R heel w fluctance no drainage  Dialysis Access: LUE AVF with aneyursms +thrill/bruit  Additional Objective Labs: Basic Metabolic Panel:  Recent Labs Lab 03/05/16 0144 03/05/16 2124 03/06/16 0617  NA 134* 136 136  K 4.3 3.7 3.4*  CL 103 95* 97*  CO2 19* 27 25  GLUCOSE  109* 181* 172*  BUN 46* 19 23*  CREATININE 11.62* 6.01* 7.13*  CALCIUM 8.5* 8.5* 8.3*  PHOS 4.7* 4.0 5.1*   Liver Function Tests:  Recent Labs Lab 03/03/16 1446 03/03/16 1927 03/04/16 0616 03/05/16 0144 03/05/16 2124 03/06/16 0617  AST 27 23 31   --   --   --   ALT 9* 10* 10*  --   --   --   ALKPHOS 88 81 78  --   --   --   BILITOT 0.7 0.9 1.1  --   --   --   PROT 7.6 6.9 6.7  --   --   --   ALBUMIN 2.9* 2.5* 2.4* 2.2* 2.5* 2.3*    Recent Labs Lab 03/03/16 1446 03/03/16 1927  LIPASE 18 22  AMYLASE  --  119*   CBC:  Recent Labs Lab 03/03/16 1446 03/03/16 1927 03/04/16 0616 03/05/16 0144 03/05/16 2124 03/06/16 0617  WBC 11.6* 14.1* 11.8* 10.8* 9.8 8.5  NEUTROABS 8.7* 10.3*  --   --   --   --   HGB 10.7* 10.1* 10.7* 9.9* 11.2* 10.4*  HCT 32.4* 31.1* 32.6* 30.0* 33.4* 31.0*  MCV 104.5* 104.0* 105.2* 102.4* 101.8* 101.3*  PLT 192 185 257 198 212 218   Blood Culture  Component Value Date/Time   SDES BLOOD RIGHT HAND 03/03/2016 1524   SPECREQUEST BOTTLES DRAWN AEROBIC ONLY 5CC 03/03/2016 1524   CULT NO GROWTH 2 DAYS 03/03/2016 1524   REPTSTATUS PENDING 03/03/2016 1524    Cardiac Enzymes:  Recent Labs Lab 03/03/16 1927 03/03/16 2331 03/04/16 0616  CKTOTAL 83  --   --   TROPONINI <0.03 0.04* 0.05*   CBG:  Recent Labs Lab 03/05/16 0339 03/05/16 0726 03/05/16 1617 03/05/16 2102 03/06/16 0804  GLUCAP 101* 126* 92 185* 167*   Iron Studies: No results for input(s): IRON, TIBC, TRANSFERRIN, FERRITIN in the last 72 hours. Lab Results  Component Value Date   INR 1.37 03/03/2016   INR 1.13 06/17/2014   INR 1.06 02/23/2014   Medications:   . darbepoetin (ARANESP) injection - DIALYSIS  60 mcg Intravenous Q Tue-HD  . dolutegravir  50 mg Oral Daily  . doxercalciferol  7 mcg Intravenous Q T,Th,Sa-HD  . heparin  5,000 Units Subcutaneous Q8H  . insulin aspart  0-9 Units Subcutaneous TID WC  . lamiVUDine  50 mg Oral Daily  . midodrine  10 mg Oral  TID WC  . multivitamin  1 tablet Oral QHS  . piperacillin-tazobactam (ZOSYN)  IV  3.375 g Intravenous Q12H  . [START ON 03/10/2016] tenofovir  300 mg Oral Weekly

## 2016-03-06 NOTE — Progress Notes (Signed)
PULMONARY / CRITICAL CARE MEDICINE   Name: Alvin Daniels MRN: [id number] DOB: 1971-03-08    ADMISSION DATE:  03/03/2016 CONSULTATION DATE:  03/03/2016   REFERRING MD:  Dr Tristan Schroeder of ER  CHIEF COMPLAINT:  Circulatory shock -suspected sepsis  HISTORY OF PRESENT ILLNESS:   Alvin Daniels is a 45 y.o.  male of Hispanic or Latino was diabetes end-stage renal disease on Tuesday Thursday Saturday dialysis. He is also HIV positive with last CD4 count reported as 240. History is obtained from the emergency room physician and the bedside nurse. Patient speaks very poor English and therefore I could not understand what he is trying to say. As best as I can gather last dialysis was 03/02/2016 yesterday Saturday. He did before that or after this started having fever or chills. When he presented to the ER today he reported that his dry weight from 49 kg had reduced to 42 kg postdialysis yesterday. ER physician found to him to be in chills and rigors and subjective fevers but no documented fever. Patient was also hypotensive. 3 L saline bolus was given, but patient continued to be hypotensive and needed levophed. Upon initiation of Levophed blood pressure responded quite well, and patient got tapered down to 2 mcg/m of Levophed at the time of critical care medicine evaluation. He otherwise feels fine and is communicative. Levothyroid is being given by the peripheral vein.  SUBJECTIVE: She denies any chest pain or pressure. He denies any dyspnea or cough. Currently on room air. Reports minimal pain in his right heel.  REVIEW OF SYSTEMS: No abdominal pain, nausea, or vomiting. No subjective fever or chills. No headache or vision changes.  VITAL SIGNS: BP (!) 91/50 (BP Location: Right Arm)   Pulse 91   Temp 99.8 F (37.7 C) (Oral)   Resp 17   Wt 123 lb 0.3 oz (55.8 kg)   SpO2 98%   BMI 23.24 kg/m   HEMODYNAMICS:    VENTILATOR SETTINGS:    INTAKE / OUTPUT: I/O last 3  completed shifts: In: 620 [P.O.:240; I.V.:230; IV Piggyback:150] Out: 1000 [Other:1000]  PHYSICAL EXAMINATION: General:  Awake. No distress. Sitting up in bed watching TV. Neuro:  Oriented 3. Grossly nonfocal. No meningismus. HEENT:  Moist mucous membranes. No scleral icterus or injection. Cardiovascular:  Regular rhythm. No edema. No appreciable JVD. Lungs:  Clear to auscultation. Normal work of breathing on room air. Abdomen:  Soft.normal bowel sounds. Nontender.  Musculoskeletal:  Right heel without deformity. Left below the knee amputation. Skin:  Warm and dry. No rash on exposed skin.  LABS:  PULMONARY  Recent Labs Lab 03/03/16 1439  PHART 7.460*  PCO2ART 36.9  PO2ART 34.0*  HCO3 26.2  TCO2 27  O2SAT 69.0   CBC  Recent Labs Lab 03/05/16 0144 03/05/16 2124 03/06/16 0617  HGB 9.9* 11.2* 10.4*  HCT 30.0* 33.4* 31.0*  WBC 10.8* 9.8 8.5  PLT 198 212 218   COAGULATION  Recent Labs Lab 03/03/16 1927  INR 1.37   CARDIAC    Recent Labs Lab 03/03/16 1927 03/03/16 2331 03/04/16 0616  TROPONINI <0.03 0.04* 0.05*   No results for input(s): PROBNP in the last 168 hours.  CHEMISTRY  Recent Labs Lab 03/03/16 1927 03/04/16 0616 03/05/16 0144 03/05/16 2124 03/06/16 0617  NA 137 136 134* 136 136  K 4.4 4.7 4.3 3.7 3.4*  CL 106 106 103 95* 97*  CO2 20* 19* 19* 27 25  GLUCOSE 154* 130* 109* 181* 172*  BUN 30* 37*  46* 19 23*  CREATININE 8.51* 9.25* 11.62* 6.01* 7.13*  CALCIUM 8.3* 8.5* 8.5* 8.5* 8.3*  MG  --  2.1 2.2  --  2.2  PHOS  --  4.7* 4.7* 4.0 5.1*   Estimated Creatinine Clearance: 9.7 mL/min (by C-G formula based on SCr of 7.13 mg/dL (H)).  LIVER  Recent Labs Lab 03/03/16 1446 03/03/16 1927 03/04/16 0616 03/05/16 0144 03/05/16 2124 03/06/16 0617  AST 27 23 31   --   --   --   ALT 9* 10* 10*  --   --   --   ALKPHOS 88 81 78  --   --   --   BILITOT 0.7 0.9 1.1  --   --   --   PROT 7.6 6.9 6.7  --   --   --   ALBUMIN 2.9* 2.5* 2.4*  2.2* 2.5* 2.3*  INR  --  1.37  --   --   --   --    INFECTIOUS  Recent Labs Lab 03/03/16 1927 03/03/16 2221 03/03/16 2331 03/04/16 0616 03/05/16 0144  LATICACIDVEN 1.0 1.2 1.2  --   --   PROCALCITON 5.56  --   --  10.85 15.10   ENDOCRINE CBG (last 3)   Recent Labs  03/05/16 2102 03/06/16 0804 03/06/16 1148  GLUCAP 185* 167* 213*    IMAGING x48h  - image(s) personally visualized  -   highlighted in bold Ct Abdomen Pelvis W Contrast  Result Date: 03/05/2016 CLINICAL DATA:  Generalized abdominal pain, chronic diarrhea for 3 months. EXAM: CT ABDOMEN AND PELVIS WITH CONTRAST TECHNIQUE: Multidetector CT imaging of the abdomen and pelvis was performed using the standard protocol following bolus administration of intravenous contrast. CONTRAST:  156mL ISOVUE-300 IOPAMIDOL (ISOVUE-300) INJECTION 61% COMPARISON:  CT scan of April 19, 2013. FINDINGS: Lower chest: Mild haziness of both visualized lung bases is noted suggesting possible early edema. Hepatobiliary: No gallstones are noted. Liver appears normal. Stable dilatation of common bile duct and intrahepatic ducts is noted. Pancreas: Stable dilatation of pancreatic duct is noted. Spleen: Normal. Adrenals/Urinary Tract: Adrenal glands and kidneys appear normal. No hydronephrosis or renal obstruction is noted. Urinary bladder is decompressed. Stomach/Bowel: Severe wall thickening is seen involving the distal transverse colon, descending and sigmoid colon and rectum consistent with colitis. There is no evidence of small bowel dilatation. Vascular/Lymphatic: Atherosclerosis of abdominal aorta is noted without aneurysm formation. No significant adenopathy is noted. Reproductive: Prostate gland is unremarkable. Other: Minimal amount of free fluid is noted in the pelvis. Musculoskeletal: No significant osseous abnormality is noted. IMPRESSION: Mild haziness of both visualized lung base is noted suggesting possible early edema. Stable dilatation of  common bile duct and intrahepatic ducts and pancreatic duct is noted compared to prior exam. Aortic atherosclerosis. Severe wall thickening is seen involving the distal transverse, descending and sigmoid colon as well as rectum consistent with infectious or inflammatory colitis. Minimal amount of free fluid is noted in the pelvis. Electronically Signed   By: Marijo Conception, M.D.   On: 03/05/2016 07:38   Mr Foot Right Wo Contrast  Result Date: 03/06/2016 CLINICAL DATA:  Pain and swelling of the right foot. History of remote calcaneal fracture. EXAM: MRI OF THE RIGHT FOREFOOT WITHOUT CONTRAST TECHNIQUE: Multiplanar, multisequence MR imaging was performed. No intravenous contrast was administered. COMPARISON:  CT scan 01/22/2016 FINDINGS: Evidence of a remote ununited centrally depressed calcaneus fracture. A large portion of the posterior facet has dark T1 and T2 signal intensity consistent  with necrotic bone. The cortical margins are smooth and intact. I do not see any obvious destructive bony changes of advanced osteomyelitis. There is fluid in the ununited fracture lines. Diffuse patchy abnormal marrow signal noted in the distal tibia, talus, cuboid and scattered cuneiforms and navicular bone. This is all probably stress related change due to the ununited calcaneus fracture. Osteomyelitis cannot be totally excluded. Diffuse signal abnormality in the ankle and foot musculature is likely stress related or nonspecific myositis. I do not see any significant findings for cellulitis or soft tissue abscess. IMPRESSION: 1. Chronic ununited calcaneal fracture. A segment of the posterior calcaneal facet is likely necrotic given its signal characteristics. 2. Diffuse patchy marrow signal abnormality is most likely stress related change due to altered mechanics and un stable, ununited calcaneus fracture. Could not totally exclude the possibility of osteomyelitis. Recommend correlation with clinical findings. 3. Diffuse  muscle signal abnormality likely stress related change or disuse. 4. No findings for soft tissue abscess or significant cellulitis. Electronically Signed   By: Marijo Sanes M.D.   On: 03/06/2016 09:05   ASSESSMENT / PLAN:  45-y/o male with diabetes, ESRD, and HIV who presents with hypotension, tachycardia and low grade fever and mild leukocytosis, concerning for shock from sepsis vs volume depletion. Patient's hypotension has resolved. No evidence of additional infection outside of colitis and this is questionably viral. Does have a right calcaneal fracture which will need to be assessed by orthopedic surgery prior to discharge from hospital. Appreciate recommendations from infectious diseases specialist. Continuing on intermittent hemodialysis which she is tolerating with the use of Midodrine.  1. Hypotension:  Baseline hypotensive. S/P volume resuscitation & off pressors. Continuing Midodrine TID with meals.  2. Elevated Troponin I:  Likely demand ischemia. 3. ESRD:  On hemodialysis Tuesday, Thursday, & Saturday. Tolerated intermittent HD. 4. Colitis:  Seen on CT abdomen/pelvis. Stool C diff negative 10/23. Questionably infectious and possibly caused by Rhinovirus from NP swab. Finishing 3 days of additional Augmentin per ID recommendations. 5. HIV:  ID following. Continuing Tivicay, Epivir, & Viread. 6. R Calcaneal Fracture:  Consulting orthopedics for evaluation. 7. Anemia:  Chronic. Continuing Aranesp.  8. Prophylaxis:  Heparin [place] q8hr. 9. Diet:  Renal HD diet. 10. Disposition:  Pending evaluation by Orthopedic surgery.   Alvin Daniels Ashok Cordia, M.D. [facility] Pager:  [phone] After 3pm or if no response, call [phone] 1:22 PM 03/06/16

## 2016-03-06 NOTE — Telephone Encounter (Signed)
Katie at [facility] would like a consult for pt. At [facility]

## 2016-03-06 NOTE — Discharge Summary (Signed)
Physician Discharge Summary  Patient ID: Alvin Daniels MRN: [id number] DOB/AGE: 08/11/1970 45 y.o.  Admit date: 03/03/2016 Discharge date: 03/07/2016    Discharge Diagnoses:  Principal Problem:   Shock circulatory ([place]) Active Problems:   Diabetes mellitus with ESRD (end-stage renal disease) ([place])   End stage renal failure on dialysis ([place])   Chronic hepatitis C without hepatic coma ([place])   AIDS ([place])                                                       D/c plan by Discharge Diagnosis  Circulatory shock -- resolved.  Likely some degree sepsis plus dehydration after being below EDW with underlying chronic hypotension. .   Troponin leak - mild. Likely r/t demand in setting significant hypotension  Chronic hypotension  PLAN -  Midodrine - new rx given that he can fill at [facility]   Colitis - Afebrile.  WBC wnl.  C. Difficile and GI pathogen panel negative Chronic diarrhea  PLAN -  Continue augmentin x 3 days for total 7 day course abx  Recommend outpt GI w/u for chronic diarrhea   HIV/AIDS - CD4 count 180 on 10/22. HIV viral load undetectable.  PLAN -  Continue Tivicay 50 mg QD, Tenofovir 300 mg Qweek, and Lamivudine 100 mg QD Hold off on Bactrim QD per ID as viral load undetectable    ESRD-  PLAN -  TTS HD per renal  Will have new EDW at d/c   DM  PLAN -  Continue home   R calcaneal fx -- no s/s osteo  PLAN -  outpt ortho f/u    Brief Summary: Alvin Daniels is a 45 y.o. y/o male with a PMH of DM, hx L BKA, ESRD, chronic hepatitis, HIV presented 10/22 with fever, severe abd pain, back pain, diarrhea.   In ER was hypotensive requiring pressors. He was admitted by PCCM with sepsis worsened by dehydration and placed on broad spectrum abx.  He was seen in consultation by ID for HIV management as well as sepsis.  CDiff and GI pathogens were neg and CT revealed changes consistent with inflammatory colitis .  Come concern for  infection of R heel wound, but MRI neg for osteo, most likely stress related changes.  Pt was volume resuscitated and pressors weaned off, back to his maintenance midodrine (which he admits he was not taking prior to admit).  He is tolerating HD with adjusted volume goals.  Seen in consultation by ortho for calcaneal fx and no treatment needed. He is back to baseline and ready for d/c home with close outpt f/u.    Consults: ID  Renal   Lines/tubes: none  Current antibiotics: Vancomycin 10/22>>10/24 Zosyn 10/22>>10/25 Augmentin 10/25>>> 7 days total abx   Microbiology: -BCx 10/22 >> NGTD -GI panel negative -C. Diff negative  Significant Diagnostic Studies:  CT abd/pelvis 10/25>>> Stable dilatation of common bile duct and intrahepatic ducts and pancreatic duct is noted compared to prior exam. Aortic atherosclerosis. Severe wall thickening is seen involving the distal transverse, descending and sigmoid colon as well as rectum consistent with infectious or inflammatory colitis. Minimal amount of free fluid is noted in the pelvis.    Vitals:   03/07/16 0830 03/07/16 0845 03/07/16 0900 03/07/16 0930  BP: (!) 68/47 (!) 83/56 (!) 71/49 (!) 62/45  Pulse: 76 76 73 79  Resp:      Temp:      TempSrc:      SpO2:      Weight:         Discharge Labs  BMET  Recent Labs Lab 03/04/16 0616 03/05/16 0144 03/05/16 2124 03/06/16 0617 03/07/16 0520  NA 136 134* 136 136 138  K 4.7 4.3 3.7 3.4* 3.3*  CL 106 103 95* 97* 99*  CO2 19* 19* 27 25 25   GLUCOSE 130* 109* 181* 172* 187*  BUN 37* 46* 19 23* 37*  CREATININE 9.25* 11.62* 6.01* 7.13* 10.74*  CALCIUM 8.5* 8.5* 8.5* 8.3* 8.7*  MG 2.1 2.2  --  2.2 2.4  PHOS 4.7* 4.7* 4.0 5.1* 6.3*     CBC   Recent Labs Lab 03/05/16 2124 03/06/16 0617 03/07/16 0520  HGB 11.2* 10.4* 11.5*  HCT 33.4* 31.0* 34.9*  WBC 9.8 8.5 8.7  PLT 212 218 240   Anti-Coagulation  Recent Labs Lab 03/03/16 1927  INR 1.37      Discharge  Instructions    Increase activity slowly    Complete by:  As directed    Weight bearing as tolerated    Complete by:  As directed    Laterality:  right   Extremity:  Lower       Follow-up Information    Newt Minion, MD Follow up in 3 week(s).   Specialty:  Orthopedic Surgery Contact information: [place] [address] [address]. Schedule an appointment as soon as possible for a visit in 2 week(s).   Contact information: [place] [address] [phone]             Medication List    STOP taking these medications   Elbasvir-Grazoprevir 50-100 MG Tabs Commonly known as:  ZEPATIER   ondansetron 4 MG tablet Commonly known as:  ZOFRAN   polyethylene glycol packet Commonly known as:  MIRALAX / GLYCOLAX   sulfamethoxazole-trimethoprim 400-80 MG tablet Commonly known as:  BACTRIM,SEPTRA     TAKE these medications   amoxicillin-clavulanate 500-125 MG tablet Commonly known as:  AUGMENTIN Take 1 tablet (500 mg total) by mouth daily.   HYDROcodone-acetaminophen 5-325 MG tablet Commonly known as:  NORCO Take 1 tablet by mouth every 6 (six) hours as needed. What changed:  reasons to take this   lamivudine 100 MG tablet Commonly known as:  EPIVIR Take 0.5 tablets (50 mg total) by mouth daily.   lanthanum 500 MG chewable tablet Commonly known as:  FOSRENOL Chew 1 tablet (500 mg total) by mouth 3 (three) times daily with meals.   methocarbamol 500 MG tablet Commonly known as:  ROBAXIN Take 1 tablet (500 mg total) by mouth every 6 (six) hours as needed for muscle spasms.   midodrine 10 MG tablet Commonly known as:  PROAMATINE Take 1 tablet (10 mg total) by mouth 3 (three) times daily with meals.   multivitamin Tabs tablet Take 1 tablet by mouth at bedtime.   omeprazole 40 MG capsule Commonly known as:  PRILOSEC TAKE 1 CAPSULE BY MOUTH EVERY  DAY   tenofovir 300 MG tablet Commonly known as:  VIREAD TAKE 1 TABLET BY MOUTH ONCE A WEEK ON SUNDAY   TIVICAY 50 MG tablet Generic drug:  dolutegravir TAKE 1 TABLET BY MOUTH EVERY DAY         Disposition: 01-Home  or Self Care  Discharged Condition: Alvin Daniels has met maximum benefit of inpatient care and is medically stable and cleared for discharge.  Patient is pending follow up as above.      Time spent on disposition:  Greater than 35 minutes.   SignedNickolas Madrid, NP 03/07/2016  10:05 AM Pager: [phone] or [phone]

## 2016-03-06 NOTE — Telephone Encounter (Signed)
I called and advised Dr. Sharol Given of the consult he will see this pt today.

## 2016-03-06 NOTE — Consult Note (Signed)
ORTHOPAEDIC CONSULTATION  REQUESTING PHYSICIAN: Javier Glazier, MD  Chief Complaint: Right foot pain  HPI: Alvin Daniels is a 45 y.o. male who presents with sepsis. Patient has had a chronic calcaneus fracture on the right and a left transtibial amputation. Past medical history includes diabetes end stage renal disease on dialysis with history of MRSA infection and HIV infection.  Past Medical History:  Diagnosis Date  . AIDS ([place]) 11/22/2014  . Chronic diarrhea   . Chronic hepatitis C without hepatic coma ([place]) 11/22/2014  . Diabetic neuropathy ([place])   . ESRD (end stage renal disease) on dialysis [facility])    "TTS; don't remember street name" (05/03/2014)  . Hepatitis C   . HIV INFECTION 06/27/2010   Qualifier: Diagnosis of  By: Nickola Major CMA ( [place]), Geni Bers    . Hypotension 06/02/2012  . Metabolic bone disease 05/16/4816  . MRSA infection   . Normocytic anemia 06/17/2012  . Pancreatitis   . Pressure ulcer of BKA stump, stage 2 ([place]) 11/22/2014  . Renal disorder   . Severe protein-calorie malnutrition ([place]) 06/17/2012  . Uncontrolled diabetes mellitus with complications ([place]) 5/63/1497   Annotation: uncontrolled Qualifier: Diagnosis of  By: Nickola Major CMA Deborra Medina), Geni Bers     Past Surgical History:  Procedure Laterality Date  . AMPUTATION Left 04/20/2014   Procedure: 3rd toe amputation, 4th Toe Amputation,  5th Toe Amputation;  Surgeon: Newt Minion, MD;  Location: [place];  Service: Orthopedics;  Laterality: Left;  . AMPUTATION Left 05/02/2014   Procedure: Midfoot Amputation;  Surgeon: Newt Minion, MD;  Location: [place];  Service: Orthopedics;  Laterality: Left;  . AMPUTATION Left 06/17/2014   Procedure: AMPUTATION BELOW KNEE;  Surgeon: Newt Minion, MD;  Location: [place];  Service: Orthopedics;  Laterality: Left;  . AV FISTULA PLACEMENT Left   . FOOT AMPUTATION THROUGH ANKLE Left 12/'21/2015   midfoot   Social History   Social History  . Marital status: Single      Spouse name: N/A  . Number of children: N/A  . Years of education: N/A   Social History Main Topics  . Smoking status: Never Smoker  . Smokeless tobacco: Never Used  . Alcohol use No  . Drug use: No  . Sexual activity: No   Other Topics Concern  . None   Social History Narrative   ** Merged History Encounter **       Family History  Problem Relation Age of Onset  . Diabetes Mother   . Diabetes Father    - negative except otherwise stated in the family history section No Known Allergies Prior to Admission medications   Medication Sig Start Date End Date Taking? Authorizing Provider  Elbasvir-Grazoprevir (ZEPATIER) 50-100 MG TABS Take 1 tablet by mouth daily. 06/07/15  Yes Truman Hayward, MD  HYDROcodone-acetaminophen [facility]) 5-325 MG tablet Take 1 tablet by mouth every 6 (six) hours as needed. Patient taking differently: Take 1 tablet by mouth every 6 (six) hours as needed for moderate pain.  01/22/16  Yes Tatyana Kirichenko, PA-C  methocarbamol (ROBAXIN) 500 MG tablet Take 1 tablet (500 mg total) by mouth every 6 (six) hours as needed for muscle spasms. 04/22/14  Yes Vivi Barrack, MD  omeprazole (PRILOSEC) 40 MG capsule TAKE 1 CAPSULE BY MOUTH EVERY DAY 09/14/15  Yes Campbell Riches, MD  sulfamethoxazole-trimethoprim (BACTRIM,SEPTRA) 400-80 MG tablet TAKE 1 TABLET BY MOUTH DAILY 02/23/16  Yes Truman Hayward, MD  tenofovir [facility])  300 MG tablet TAKE 1 TABLET BY MOUTH ONCE A WEEK ON SUNDAY 02/23/16  Yes Truman Hayward, MD  TIVICAY 50 MG tablet TAKE 1 TABLET BY MOUTH EVERY DAY 02/23/16  Yes Truman Hayward, MD  amoxicillin-clavulanate (AUGMENTIN) 500-125 MG tablet Take 1 tablet (500 mg total) by mouth daily. 03/07/16   Marijean Heath, NP  lamivudine (EPIVIR) 100 MG tablet Take 0.5 tablets (50 mg total) by mouth daily. 03/06/16   Marijean Heath, NP  midodrine (PROAMATINE) 10 MG tablet Take 1 tablet (10 mg total) by mouth 3 (three) times daily with  meals. Patient not taking: Reported on 03/03/2016 06/07/15   Truman Hayward, MD  midodrine (PROAMATINE) 10 MG tablet Take 1 tablet (10 mg total) by mouth 3 (three) times daily with meals. 03/06/16   Marijean Heath, NP  multivitamin (RENA-VIT) TABS tablet Take 1 tablet by mouth at bedtime. 03/06/16   Marijean Heath, NP  ondansetron (ZOFRAN) 4 MG tablet Take 1 tablet (4 mg total) by mouth every 6 (six) hours as needed for nausea. Patient not taking: Reported on 03/03/2016 04/22/14   Vivi Barrack, MD  polyethylene glycol [facility] / Floria Raveling) packet Take 17 g by mouth daily as needed for moderate constipation or severe constipation. Patient not taking: Reported on 03/03/2016 04/22/14   Vivi Barrack, MD   Ct Abdomen Pelvis W Contrast  Result Date: 03/05/2016 CLINICAL DATA:  Generalized abdominal pain, chronic diarrhea for 3 months. EXAM: CT ABDOMEN AND PELVIS WITH CONTRAST TECHNIQUE: Multidetector CT imaging of the abdomen and pelvis was performed using the standard protocol following bolus administration of intravenous contrast. CONTRAST:  15mL ISOVUE-300 IOPAMIDOL (ISOVUE-300) INJECTION 61% COMPARISON:  CT scan of April 19, 2013. FINDINGS: Lower chest: Mild haziness of both visualized lung bases is noted suggesting possible early edema. Hepatobiliary: No gallstones are noted. Liver appears normal. Stable dilatation of common bile duct and intrahepatic ducts is noted. Pancreas: Stable dilatation of pancreatic duct is noted. Spleen: Normal. Adrenals/Urinary Tract: Adrenal glands and kidneys appear normal. No hydronephrosis or renal obstruction is noted. Urinary bladder is decompressed. Stomach/Bowel: Severe wall thickening is seen involving the distal transverse colon, descending and sigmoid colon and rectum consistent with colitis. There is no evidence of small bowel dilatation. Vascular/Lymphatic: Atherosclerosis of abdominal aorta is noted without aneurysm formation. No significant  adenopathy is noted. Reproductive: Prostate gland is unremarkable. Other: Minimal amount of free fluid is noted in the pelvis. Musculoskeletal: No significant osseous abnormality is noted. IMPRESSION: Mild haziness of both visualized lung base is noted suggesting possible early edema. Stable dilatation of common bile duct and intrahepatic ducts and pancreatic duct is noted compared to prior exam. Aortic atherosclerosis. Severe wall thickening is seen involving the distal transverse, descending and sigmoid colon as well as rectum consistent with infectious or inflammatory colitis. Minimal amount of free fluid is noted in the pelvis. Electronically Signed   By: Marijo Conception, M.D.   On: 03/05/2016 07:38   Mr Foot Right Wo Contrast  Result Date: 03/06/2016 CLINICAL DATA:  Pain and swelling of the right foot. History of remote calcaneal fracture. EXAM: MRI OF THE RIGHT FOREFOOT WITHOUT CONTRAST TECHNIQUE: Multiplanar, multisequence MR imaging was performed. No intravenous contrast was administered. COMPARISON:  CT scan 01/22/2016 FINDINGS: Evidence of a remote ununited centrally depressed calcaneus fracture. A large portion of the posterior facet has dark T1 and T2 signal intensity consistent with necrotic bone. The cortical margins are smooth and intact.  I do not see any obvious destructive bony changes of advanced osteomyelitis. There is fluid in the ununited fracture lines. Diffuse patchy abnormal marrow signal noted in the distal tibia, talus, cuboid and scattered cuneiforms and navicular bone. This is all probably stress related change due to the ununited calcaneus fracture. Osteomyelitis cannot be totally excluded. Diffuse signal abnormality in the ankle and foot musculature is likely stress related or nonspecific myositis. I do not see any significant findings for cellulitis or soft tissue abscess. IMPRESSION: 1. Chronic ununited calcaneal fracture. A segment of the posterior calcaneal facet is likely  necrotic given its signal characteristics. 2. Diffuse patchy marrow signal abnormality is most likely stress related change due to altered mechanics and un stable, ununited calcaneus fracture. Could not totally exclude the possibility of osteomyelitis. Recommend correlation with clinical findings. 3. Diffuse muscle signal abnormality likely stress related change or disuse. 4. No findings for soft tissue abscess or significant cellulitis. Electronically Signed   By: Marijo Sanes M.D.   On: 03/06/2016 09:05   - pertinent xrays, CT, MRI studies were reviewed and independently interpreted  Positive ROS: All other systems have been reviewed and were otherwise negative with the exception of those mentioned in the HPI and as above.  Physical Exam: General: Alert, no acute distress Psychiatric: Patient is competent for consent with normal mood and affect Lymphatic: No axillary or cervical lymphadenopathy Cardiovascular: No pedal edema Respiratory: No cyanosis, no use of accessory musculature GI: No organomegaly, abdomen is soft and non-tender  Skin: Examination of both legs patient has no ulcers on the left lower extremity with a stable transtibial amputation. Right lower extremity patient does have swelling in the hindfoot but no redness no cellulitis no open wounds no signs of infection.   Neurologic: Patient does not have protective sensation bilateral lower extremities.   MUSCULOSKELETAL:  Examination patient has a stable left transtibial amputation this is well consolidated no signs of infection. Examination of the right foot patient has no pain with range of motion of the ankle or subtalar joint there is swelling. Patient does not have a palpable pulse. Radiographs shows calcification of the digital vessels and the foot and shows a chronic calcaneal fracture. The MRI scan was reviewed which also shows the chronic calcaneal fracture. No signs of abscess or MRI scan.  Assessment: Assessment:  Stable left transtibial amputation with chronic calcaneal fracture on the right with no signs of abscess, no signs of osteomyelitis.  Plan: Plan: I will follow-up in the office as an outpatient. Do not feel patient needs any treatment for the right foot.  Thank you for the consult and the opportunity to see Alvin Daniels, [place] [phone] 5:03 PM

## 2016-03-06 NOTE — Progress Notes (Signed)
[facility] for Infectious Disease    Date of Admission:  03/03/2016   Total days of antibiotics 4        Day 4 Zosyn   ID: Alvin Daniels is a 45 y.o. male with PMHx of HIV/AIDS (CD4 count 180 on 10/22), ESRD on HT TTS, Chronic Hepatitis C s/p Zepatier, T2DM, and h/o left BKA who presented to the ED on 10/22 with fever, leukocytosis, lower abdominal pain and diarrhea found to have severe colitis.  Principal Problem:   Shock circulatory ([place]) Active Problems:   Diabetes mellitus with ESRD (end-stage renal disease) ([place])   End stage renal failure on dialysis ([name])   Chronic hepatitis C without hepatic coma ([name])   AIDS ([name])  Subjective: Patient was seen and examined this morning. He continues to have his normal 3 loose stools per day, but no fever, chills, or abdominal pain. He feels very well and back to his baseline. He wants to go home.   Medications:  . darbepoetin (ARANESP) injection - DIALYSIS  60 mcg Intravenous Q Tue-HD  . dolutegravir  50 mg Oral Daily  . doxercalciferol  7 mcg Intravenous Q T,Th,Sa-HD  . feeding supplement (PRO-STAT SUGAR FREE 64)  30 mL Oral BID  . heparin  5,000 Units Subcutaneous Q8H  . insulin aspart  0-9 Units Subcutaneous TID WC  . lamiVUDine  50 mg Oral Daily  . midodrine  10 mg Oral TID WC  . multivitamin  1 tablet Oral QHS  . piperacillin-tazobactam (ZOSYN)  IV  3.375 g Intravenous Q12H  . [START ON 03/10/2016] tenofovir  300 mg Oral Weekly    Objective: Vitals:   03/05/16 1845 03/05/16 2103 03/06/16 0523 03/06/16 0950  BP: (!) 142/91 (!) 83/50 (!) 89/47 (!) 91/50  Pulse: 88 85 88 91  Resp: 17 17 17 17   Temp: 98.3 F (36.8 C) 98.5 F (36.9 C) 99.6 F (37.6 C) 99.8 F (37.7 C)  TempSrc: Oral Oral Oral Oral  SpO2: 100% 100% 99% 98%  Weight:  123 lb 0.3 oz (55.8 kg)     General: Vital signs reviewed.  Patient is well-developed and well-nourished, in no acute distress and cooperative with exam.  Cardiovascular:  RRR Pulmonary/Chest: Clear to auscultation bilaterally. No rales, wheezes, or rhonchi. Abdominal: Soft, nontender, non-distended, BS + Extremities: S/p left BKA.  Skin: Warm, dry and intact. Chronic hypopigmented rash on right lower extremity.    Lab Results  Recent Labs  03/05/16 2124 03/06/16 0617  WBC 9.8 8.5  HGB 11.2* 10.4*  HCT 33.4* 31.0*  NA 136 136  K 3.7 3.4*  CL 95* 97*  CO2 27 25  BUN 19 23*  CREATININE 6.01* 7.13*   Liver Panel  Recent Labs  03/03/16 1927 03/04/16 0616  03/05/16 2124 03/06/16 0617  PROT 6.9 6.7  --   --   --   ALBUMIN 2.5* 2.4*  < > 2.5* 2.3*  AST 23 31  --   --   --   ALT 10* 10*  --   --   --   ALKPHOS 81 78  --   --   --   BILITOT 0.9 1.1  --   --   --   BILIDIR  --  0.2  --   --   --   IBILI  --  0.9  --   --   --   < > = values in this interval not displayed.  Microbiology: -BCx 10/22 >> NGTD -  GI panel negative -C. Diff negative  Current antibiotics: Vancomycin 10/22>>10/24 Zosyn 10/22>>  Studies/Results: Ct Abdomen Pelvis W Contrast  Result Date: 03/05/2016 CLINICAL DATA:  Generalized abdominal pain, chronic diarrhea for 3 months. EXAM: CT ABDOMEN AND PELVIS WITH CONTRAST TECHNIQUE: Multidetector CT imaging of the abdomen and pelvis was performed using the standard protocol following bolus administration of intravenous contrast. CONTRAST:  178mL ISOVUE-300 IOPAMIDOL (ISOVUE-300) INJECTION 61% COMPARISON:  CT scan of April 19, 2013. FINDINGS: Lower chest: Mild haziness of both visualized lung bases is noted suggesting possible early edema. Hepatobiliary: No gallstones are noted. Liver appears normal. Stable dilatation of common bile duct and intrahepatic ducts is noted. Pancreas: Stable dilatation of pancreatic duct is noted. Spleen: Normal. Adrenals/Urinary Tract: Adrenal glands and kidneys appear normal. No hydronephrosis or renal obstruction is noted. Urinary bladder is decompressed. Stomach/Bowel: Severe wall thickening is  seen involving the distal transverse colon, descending and sigmoid colon and rectum consistent with colitis. There is no evidence of small bowel dilatation. Vascular/Lymphatic: Atherosclerosis of abdominal aorta is noted without aneurysm formation. No significant adenopathy is noted. Reproductive: Prostate gland is unremarkable. Other: Minimal amount of free fluid is noted in the pelvis. Musculoskeletal: No significant osseous abnormality is noted. IMPRESSION: Mild haziness of both visualized lung base is noted suggesting possible early edema. Stable dilatation of common bile duct and intrahepatic ducts and pancreatic duct is noted compared to prior exam. Aortic atherosclerosis. Severe wall thickening is seen involving the distal transverse, descending and sigmoid colon as well as rectum consistent with infectious or inflammatory colitis. Minimal amount of free fluid is noted in the pelvis. Electronically Signed   By: Marijo Conception, M.D.   On: 03/05/2016 07:38   Mr Foot Right Wo Contrast  Result Date: 03/06/2016 CLINICAL DATA:  Pain and swelling of the right foot. History of remote calcaneal fracture. EXAM: MRI OF THE RIGHT FOREFOOT WITHOUT CONTRAST TECHNIQUE: Multiplanar, multisequence MR imaging was performed. No intravenous contrast was administered. COMPARISON:  CT scan 01/22/2016 FINDINGS: Evidence of a remote ununited centrally depressed calcaneus fracture. A large portion of the posterior facet has dark T1 and T2 signal intensity consistent with necrotic bone. The cortical margins are smooth and intact. I do not see any obvious destructive bony changes of advanced osteomyelitis. There is fluid in the ununited fracture lines. Diffuse patchy abnormal marrow signal noted in the distal tibia, talus, cuboid and scattered cuneiforms and navicular bone. This is all probably stress related change due to the ununited calcaneus fracture. Osteomyelitis cannot be totally excluded. Diffuse signal abnormality in the  ankle and foot musculature is likely stress related or nonspecific myositis. I do not see any significant findings for cellulitis or soft tissue abscess. IMPRESSION: 1. Chronic ununited calcaneal fracture. A segment of the posterior calcaneal facet is likely necrotic given its signal characteristics. 2. Diffuse patchy marrow signal abnormality is most likely stress related change due to altered mechanics and un stable, ununited calcaneus fracture. Could not totally exclude the possibility of osteomyelitis. Recommend correlation with clinical findings. 3. Diffuse muscle signal abnormality likely stress related change or disuse. 4. No findings for soft tissue abscess or significant cellulitis. Electronically Signed   By: Marijo Sanes M.D.   On: 03/06/2016 09:05    Assessment/Plan: Alvin Daniels is a 45 yo male with PMHx of HIV/AIDS (CD4 count 180 on 10/22), ESRD on HT TTS, Chronic Hepatitis C s/p Zepatier, T2DM, and h/o left BKA who presented to the ED on 10/22 after experiencing sudden onset of  diffuse back pain radiating down his spine and to his neck, sternal chest pain, and abdominal pain. He was found to be hypotensive and febrile to 100.3 with a leukocytosis to 14.1 concerning for infection.  Colitis: Patient is improved clinically and back to his baseline. He has been afebrile since admission with downtrending leukocytosis to 8.5 this morning. C. Difficile and GI pathogen panel negative. Given stability and chronicity of patient's ongoing diarrhea, would consider outpatient GI referral for colonoscopy and further work up. -Zosyn 10/22>>10/25 -Will d/c Zosyn and transition to oral Augmentin 500 mg QD for 3 more days (stop date 10/28) for seven day course -Recommend outpatient GI follow up for colonoscopy and further work up for chronic diarrhea -BCx 10/22 >> NGTD  Chronic Calcaneus Fracture: Right foot MRI was obtained by primary to evaluate for osteomyelitis as a potential cause of his infectious  presentation. MRI showed diffuse patchy marrow signal abnormality is most likely stress related change due to altered mechanics and unstable, ununited calcaneus fracture. Could not totally exclude the possibility of osteomyelitis. Recommend correlation with clinical findings. Clinically, patient does not appear to have osteomyelitis, no wound to area or tenderness. Would favor following this clinically and avoiding antibiotic treatment at this time.   HIV/AIDS: CD4 count 180 on 10/22. HIV viral load undetectable. Patient is on Tivicay 50 mg QD, Tenofovir 300 mg Qweek, and Lamivudine 100 mg QD, Bactrim QD at home and reports compliance.  -Continue Tenofovir -Continue Lamivudine at 25 mg QD -Continue Tivicay -Hold off on Bactrim for PCP ppx as viral load is undetectable  Treated Hepatitis C: Patient completed 3 months of Zepatier in Spring 2017. Hep C viral load undetectable.  -Needs serial imaging as outpatient for monitoring for [place], DO PGY-3 Internal Medicine Resident Pager # [phone] 03/06/2016 11:37 AM

## 2016-03-07 DIAGNOSIS — B20 Human immunodeficiency virus [HIV] disease: Secondary | ICD-10-CM

## 2016-03-07 DIAGNOSIS — K529 Noninfective gastroenteritis and colitis, unspecified: Secondary | ICD-10-CM

## 2016-03-07 LAB — CBC WITH DIFFERENTIAL/PLATELET
BASOS PCT: 1 %
Basophils Absolute: 0.1 10*3/uL (ref 0.0–0.1)
EOS ABS: 0.5 10*3/uL (ref 0.0–0.7)
EOS PCT: 5 %
HCT: 34.9 % — ABNORMAL LOW (ref 39.0–52.0)
Hemoglobin: 11.5 g/dL — ABNORMAL LOW (ref 13.0–17.0)
LYMPHS ABS: 2 10*3/uL (ref 0.7–4.0)
Lymphocytes Relative: 23 %
MCH: 33.2 pg (ref 26.0–34.0)
MCHC: 33 g/dL (ref 30.0–36.0)
MCV: 100.9 fL — ABNORMAL HIGH (ref 78.0–100.0)
MONOS PCT: 14 %
Monocytes Absolute: 1.2 10*3/uL — ABNORMAL HIGH (ref 0.1–1.0)
Neutro Abs: 5 10*3/uL (ref 1.7–7.7)
Neutrophils Relative %: 57 %
PLATELETS: 240 10*3/uL (ref 150–400)
RBC: 3.46 MIL/uL — ABNORMAL LOW (ref 4.22–5.81)
RDW: 13.5 % (ref 11.5–15.5)
WBC: 8.7 10*3/uL (ref 4.0–10.5)

## 2016-03-07 LAB — RENAL FUNCTION PANEL
Albumin: 2.5 g/dL — ABNORMAL LOW (ref 3.5–5.0)
Anion gap: 14 (ref 5–15)
BUN: 37 mg/dL — AB (ref 6–20)
CALCIUM: 8.7 mg/dL — AB (ref 8.9–10.3)
CHLORIDE: 99 mmol/L — AB (ref 101–111)
CO2: 25 mmol/L (ref 22–32)
CREATININE: 10.74 mg/dL — AB (ref 0.61–1.24)
GFR calc Af Amer: 6 mL/min — ABNORMAL LOW (ref >60)
GFR, EST NON AFRICAN AMERICAN: 5 mL/min — AB (ref >60)
Glucose, Bld: 187 mg/dL — ABNORMAL HIGH (ref 65–99)
Phosphorus: 6.3 mg/dL — ABNORMAL HIGH (ref 2.5–4.6)
Potassium: 3.3 mmol/L — ABNORMAL LOW (ref 3.5–5.1)
SODIUM: 138 mmol/L (ref 135–145)

## 2016-03-07 LAB — MAGNESIUM: MAGNESIUM: 2.4 mg/dL (ref 1.7–2.4)

## 2016-03-07 LAB — GLUCOSE, CAPILLARY
Glucose-Capillary: 197 mg/dL — ABNORMAL HIGH (ref 65–99)
Glucose-Capillary: 118 mg/dL — ABNORMAL HIGH (ref 65–99)

## 2016-03-07 MED ORDER — LANTHANUM CARBONATE 500 MG PO CHEW
500.0000 mg | CHEWABLE_TABLET | Freq: Three times a day (TID) | ORAL | Status: DC
  Administered 2016-03-07: 500 mg via ORAL
  Filled 2016-03-07: qty 1

## 2016-03-07 MED ORDER — SODIUM CHLORIDE 0.9 % IV SOLN: 100.0000 mL | INTRAVENOUS | Status: DC | PRN

## 2016-03-07 MED ORDER — LANTHANUM CARBONATE 500 MG PO CHEW
500.0000 mg | CHEWABLE_TABLET | Freq: Three times a day (TID) | ORAL | 0 refills | Status: DC
Start: 2016-03-07 — End: 2016-05-10

## 2016-03-07 MED ORDER — LIDOCAINE-PRILOCAINE 2.5-2.5 % EX CREA: 1.0000 "application " | TOPICAL_CREAM | CUTANEOUS | Status: DC | PRN

## 2016-03-07 MED ORDER — DOXERCALCIFEROL 4 MCG/2ML IV SOLN
INTRAVENOUS | Status: AC
  Administered 2016-03-07: 7 ug via INTRAVENOUS
  Filled 2016-03-07: qty 4

## 2016-03-07 MED ORDER — PENTAFLUOROPROP-TETRAFLUOROETH EX AERO: 1.0000 "application " | INHALATION_SPRAY | CUTANEOUS | Status: DC | PRN

## 2016-03-07 MED ORDER — LIDOCAINE HCL (PF) 1 % IJ SOLN: 5.0000 mL | INTRAMUSCULAR | Status: DC | PRN

## 2016-03-07 MED ORDER — HEPARIN SODIUM (PORCINE) 1000 UNIT/ML DIALYSIS: 1000.0000 [IU] | INTRAMUSCULAR | Status: DC | PRN

## 2016-03-07 MED ORDER — AMOXICILLIN-POT CLAVULANATE 500-125 MG PO TABS: 1.0000 | ORAL_TABLET | Freq: Every day | ORAL | 0 refills | Status: DC

## 2016-03-07 MED ORDER — MIDODRINE HCL 5 MG PO TABS
ORAL_TABLET | ORAL | Status: AC
  Administered 2016-03-07: 10 mg via ORAL
  Filled 2016-03-07: qty 2

## 2016-03-07 MED ORDER — HEPARIN SODIUM (PORCINE) 1000 UNIT/ML DIALYSIS
100.0000 [IU]/kg | INTRAMUSCULAR | Status: DC | PRN
  Filled 2016-03-07: qty 6

## 2016-03-07 MED ORDER — MIDODRINE HCL 10 MG PO TABS
10.0000 mg | ORAL_TABLET | Freq: Three times a day (TID) | ORAL | 0 refills | Status: DC
Start: 2016-03-07 — End: 2016-03-11

## 2016-03-07 MED ORDER — ALTEPLASE 2 MG IJ SOLR: 2.0000 mg | Freq: Once | INTRAMUSCULAR | Status: DC | PRN

## 2016-03-07 NOTE — Procedures (Signed)
I was present at this session.  I have reviewed the session itself and made appropriate changes.  HD via LLA aVF avoiding aneurysms.  Stable. bp low, limiting vol off.  Nikkia Devoss L 10/26/20178:49 AM

## 2016-03-07 NOTE — Progress Notes (Signed)
[facility] Progress Note  Dialysis Orders:  NW TTS 4 h 2/2 400/800 EDW 56 (will have new EDW at d/c) Aranesp 25 mcg IV q week Hectorol 7 mcg IV q treatment  Venofer 50mg  IV q week   Assessment/Plan: 1. Inflammatory colitis - per abdominal CT - ID following  - GI panel/C. Diff neg BC x2 NGTD - now on PO Augmentin 2. ESRD - TTS schedule next HD 10/28 limited in vol removal by bps 3. Anemia - Hgb 11.5 Aranesp 62mcg on 10/24 4. Secondary hyperparathyroidism - Ca 8.7 P 6.3 resume OP binder  5. HTN/volume - Chronic hypotension on midodrine/ UF goal 1L check cortisols, TSH 6. Nutrition - Albumin 2.5 - renal diet/protein supplement  7. Right foot pain/swelling  - MRI with chronic calcaneal fx - no abscess evaluated by ortho 8. HIV - on HAART  9. DM 10 PVD 11 UTI   Alvin Child PA-C [facility] Pager [phone] 03/07/2016,8:46 AM  LOS: 4 days  I have seen and examined this patient and agree with the plan of care seen, examined, eval, discussed with dialysis staff and PA .  Alvin Daniels 03/07/2016, 2:54 PM   Subjective: Seen on HD BP low but asymptomatic No c/os Appetite improving No furthur stom pain  Objective Vitals:   03/07/16 0750 03/07/16 0800 03/07/16 0815 03/07/16 0830  BP: 133/64 (!) 89/59 (!) 70/49 (!) 68/47  Pulse: 76 76 76 76  Resp:      Temp:      TempSrc:      SpO2:      Weight:       Physical Exam General: Thin Hispanic male NAD Heart: RRR Gr2/6 holosys M Lungs: CTAB breathing unlabored Abdomen: soft ND/NT  Extremities: Daniels BKA chronic vascular skin changes R heel - no erythema no drainage But fluctuant Dialysis Access: LUE AVF cannulated   Additional Objective Labs: Basic Metabolic Panel:  Recent Labs Lab 03/05/16 2124 03/06/16 0617 03/07/16 0520  NA 136 136 138  K 3.7 3.4* 3.3*  CL 95* 97* 99*  CO2 27 25 25   GLUCOSE 181* 172* 187*  BUN 19 23* 37*  CREATININE 6.01* 7.13* 10.74*  CALCIUM 8.5* 8.3* 8.7*   PHOS 4.0 5.1* 6.3*   Liver Function Tests:  Recent Labs Lab 03/03/16 1446 03/03/16 1927 03/04/16 0616  03/05/16 2124 03/06/16 0617 03/07/16 0520  AST 27 23 31   --   --   --   --   ALT 9* 10* 10*  --   --   --   --   ALKPHOS 88 81 78  --   --   --   --   BILITOT 0.7 0.9 1.1  --   --   --   --   PROT 7.6 6.9 6.7  --   --   --   --   ALBUMIN 2.9* 2.5* 2.4*  < > 2.5* 2.3* 2.5*  < > = values in this interval not displayed.  Recent Labs Lab 03/03/16 1446 03/03/16 1927  LIPASE 18 22  AMYLASE  --  119*   CBC:  Recent Labs Lab 03/03/16 1446 03/03/16 1927 03/04/16 0616 03/05/16 0144 03/05/16 2124 03/06/16 0617 03/07/16 0520  WBC 11.6* 14.1* 11.8* 10.8* 9.8 8.5 8.7  NEUTROABS 8.7* 10.3*  --   --   --   --  5.0  HGB 10.7* 10.1* 10.7* 9.9* 11.2* 10.4* 11.5*  HCT 32.4* 31.1* 32.6* 30.0* 33.4* 31.0* 34.9*  MCV 104.5*  104.0* 105.2* 102.4* 101.8* 101.3* 100.9*  PLT 192 185 257 198 212 218 240   Blood Culture    Component Value Date/Time   SDES BLOOD RIGHT HAND 03/03/2016 1524   SPECREQUEST BOTTLES DRAWN AEROBIC ONLY 5CC 03/03/2016 1524   CULT NO GROWTH 3 DAYS 03/03/2016 1524   REPTSTATUS PENDING 03/03/2016 1524    Cardiac Enzymes:  Recent Labs Lab 03/03/16 1927 03/03/16 2331 03/04/16 0616  CKTOTAL 83  --   --   TROPONINI <0.03 0.04* 0.05*   CBG:  Recent Labs Lab 03/05/16 1617 03/05/16 2102 03/06/16 0804 03/06/16 1148 03/06/16 1708  GLUCAP 92 185* 167* 213* 150*   Iron Studies: No results for input(s): IRON, TIBC, TRANSFERRIN, FERRITIN in the last 72 hours. Lab Results  Component Value Date   INR 1.37 03/03/2016   INR 1.13 06/17/2014   INR 1.06 02/23/2014   Medications:   . amoxicillin-clavulanate  1 tablet Oral Daily  . darbepoetin (ARANESP) injection - DIALYSIS  60 mcg Intravenous Q Tue-HD  . dolutegravir  50 mg Oral Daily  . doxercalciferol  7 mcg Intravenous Q T,Th,Sa-HD  . feeding supplement (PRO-STAT SUGAR FREE 64)  30 mL Oral BID  .  heparin  5,000 Units Subcutaneous Q8H  . insulin aspart  0-9 Units Subcutaneous TID WC  . lamiVUDine  50 mg Oral Daily  . midodrine  10 mg Oral TID WC  . multivitamin  1 tablet Oral QHS  . [START ON 03/10/2016] tenofovir  300 mg Oral Weekly

## 2016-03-07 NOTE — Progress Notes (Signed)
Called translator and [place] ID number [phone] translated discharge instructions and medication information. Patient was instructed that he could get his Midodrine for $10.00 at [facility] per case manager. Patient understands to take his printed prescriptions to any pharmacy and have them filled.  All of patient's questions were answered via interpreter. Assessment is as charted.

## 2016-03-07 NOTE — Progress Notes (Signed)
S: No acute events overnight. Patient denies any chest pain or pressure. Denies any dyspnea or cough.  Review of systems: Denies any abdominal pain or nausea. Denies any headache.  O: BP (!) 75/52   Pulse 77   Temp 98.3 F (36.8 C) (Oral)   Resp 18   Wt 106 lb 14.8 oz (48.5 kg)   SpO2 99%   BMI 20.20 kg/m   Gen.: Laying in bed. Undergoing hemodialysis. No distress. Integument: Warm and dry. No rash on exposed skin. Left fistula access. Colovesical: Regular rate. No edema. No appreciable JVD. Plan: Normal work of breathing on room air. Clear on auscultation. Abdomen: Soft. Nontender. Nondistended.  A/P:  Discharge as planned by D/C Summary.

## 2016-03-08 DIAGNOSIS — K529 Noninfective gastroenteritis and colitis, unspecified: Secondary | ICD-10-CM | POA: Insufficient documentation

## 2016-03-08 LAB — CULTURE, BLOOD (ROUTINE X 2)
Culture: NO GROWTH
Culture: NO GROWTH

## 2016-03-11 ENCOUNTER — Other Ambulatory Visit: Payer: Self-pay | Admitting: Pharmacist

## 2016-03-11 MED ORDER — MIDODRINE HCL 10 MG PO TABS: 10.0000 mg | ORAL_TABLET | Freq: Three times a day (TID) | ORAL | 2 refills | Status: DC

## 2016-03-14 NOTE — Patient Instructions (Signed)
Please refer to progress note for details of today's visit

## 2016-03-14 NOTE — Progress Notes (Signed)
RN met with client for assessment. RN reviewed Transport planner, [place], Home Safety Management Information Booklet. Home Fire Safety Assessment, Fall Risk Assessment and Suicide Risk Assessment was performed. RN also discussed information on a Living Will, Advanced Directives, and Register. RN and Client/Designated Party educated/reviewed/signed Client Agreement and Consent for Service form along with Patient Rights and Responsibilities statement. After completing the assessment it does not appear the patient has trouble with adhering to his medication regimen. Alvin Daniels has maintained a viral suppression since 2015 along with successful cure of his Hep C. At this time I will not be opening patient to care but  RN provided contact information and reviewed how to receive emergency help after hours for schedule changes.  Standard Precaution and Infection control along with interventions to correct or prevent high risk behaviors instructed to the patient. Client/Caregiver reports understanding and agreement with the above.

## 2016-03-16 ENCOUNTER — Emergency Department (HOSPITAL_COMMUNITY)
Admission: EM | Admit: 2016-03-16 | Discharge: 2016-03-16 | Disposition: A | Discharge status: Other Acute Inpatient Hospital | Payer: No Typology Code available for payment source | Attending: Physician Assistant | Admitting: Physician Assistant

## 2016-03-16 ENCOUNTER — Encounter (HOSPITAL_COMMUNITY): Payer: Self-pay | Admitting: Emergency Medicine

## 2016-03-16 DIAGNOSIS — N186 End stage renal disease: Secondary | ICD-10-CM | POA: Insufficient documentation

## 2016-03-16 DIAGNOSIS — Y93E1 Activity, personal bathing and showering: Secondary | ICD-10-CM | POA: Insufficient documentation

## 2016-03-16 DIAGNOSIS — E1140 Type 2 diabetes mellitus with diabetic neuropathy, unspecified: Secondary | ICD-10-CM | POA: Insufficient documentation

## 2016-03-16 DIAGNOSIS — X110XXA Contact with hot water in bath or tub, initial encounter: Secondary | ICD-10-CM | POA: Insufficient documentation

## 2016-03-16 DIAGNOSIS — E1122 Type 2 diabetes mellitus with diabetic chronic kidney disease: Secondary | ICD-10-CM | POA: Insufficient documentation

## 2016-03-16 DIAGNOSIS — Y999 Unspecified external cause status: Secondary | ICD-10-CM | POA: Insufficient documentation

## 2016-03-16 DIAGNOSIS — Y929 Unspecified place or not applicable: Secondary | ICD-10-CM | POA: Insufficient documentation

## 2016-03-16 DIAGNOSIS — T25221A Burn of second degree of right foot, initial encounter: Secondary | ICD-10-CM | POA: Insufficient documentation

## 2016-03-16 DIAGNOSIS — Z23 Encounter for immunization: Secondary | ICD-10-CM | POA: Insufficient documentation

## 2016-03-16 LAB — COMPREHENSIVE METABOLIC PANEL
ALT: 10 U/L — ABNORMAL LOW (ref 17–63)
ANION GAP: 9 (ref 5–15)
AST: 23 U/L (ref 15–41)
Albumin: 2.4 g/dL — ABNORMAL LOW (ref 3.5–5.0)
Alkaline Phosphatase: 72 U/L (ref 38–126)
BILIRUBIN TOTAL: 0.6 mg/dL (ref 0.3–1.2)
BUN: 5 mg/dL — AB (ref 6–20)
CHLORIDE: 97 mmol/L — AB (ref 101–111)
CO2: 32 mmol/L (ref 22–32)
Calcium: 8 mg/dL — ABNORMAL LOW (ref 8.9–10.3)
Creatinine, Ser: 3.38 mg/dL — ABNORMAL HIGH (ref 0.61–1.24)
GFR, EST AFRICAN AMERICAN: 24 mL/min — AB (ref >60)
GFR, EST NON AFRICAN AMERICAN: 20 mL/min — AB (ref >60)
Glucose, Bld: 90 mg/dL (ref 65–99)
POTASSIUM: 3.3 mmol/L — AB (ref 3.5–5.1)
Sodium: 138 mmol/L (ref 135–145)
TOTAL PROTEIN: 6.9 g/dL (ref 6.5–8.1)

## 2016-03-16 LAB — CBC WITH DIFFERENTIAL/PLATELET
BASOS ABS: 0 10*3/uL (ref 0.0–0.1)
Basophils Relative: 0 %
EOS PCT: 1 %
Eosinophils Absolute: 0.1 10*3/uL (ref 0.0–0.7)
HEMATOCRIT: 35.6 % — AB (ref 39.0–52.0)
Hemoglobin: 11.9 g/dL — ABNORMAL LOW (ref 13.0–17.0)
LYMPHS ABS: 2.3 10*3/uL (ref 0.7–4.0)
LYMPHS PCT: 16 %
MCH: 33.8 pg (ref 26.0–34.0)
MCHC: 33.4 g/dL (ref 30.0–36.0)
MCV: 101.1 fL — ABNORMAL HIGH (ref 78.0–100.0)
MONOS PCT: 10 %
Monocytes Absolute: 1.4 10*3/uL — ABNORMAL HIGH (ref 0.1–1.0)
NEUTROS PCT: 73 %
Neutro Abs: 10.5 10*3/uL — ABNORMAL HIGH (ref 1.7–7.7)
Platelets: 357 10*3/uL (ref 150–400)
RBC: 3.52 MIL/uL — AB (ref 4.22–5.81)
RDW: 13.9 % (ref 11.5–15.5)
WBC: 14.3 10*3/uL — AB (ref 4.0–10.5)

## 2016-03-16 LAB — CK: Total CK: 71 U/L (ref 49–397)

## 2016-03-16 MED ORDER — MORPHINE SULFATE (PF) 4 MG/ML IV SOLN
4.0000 mg | INTRAVENOUS | Status: DC | PRN
  Administered 2016-03-16: 4 mg via INTRAVENOUS
  Filled 2016-03-16: qty 1

## 2016-03-16 MED ORDER — SILVER SULFADIAZINE 1 % EX CREA
TOPICAL_CREAM | Freq: Every day | CUTANEOUS | Status: DC
  Administered 2016-03-16: 14:00:00 via TOPICAL
  Filled 2016-03-16: qty 85

## 2016-03-16 MED ORDER — TETANUS-DIPHTH-ACELL PERTUSSIS 5-2.5-18.5 LF-MCG/0.5 IM SUSP
0.5000 mL | Freq: Once | INTRAMUSCULAR | Status: AC
  Administered 2016-03-16: 0.5 mL via INTRAMUSCULAR
  Filled 2016-03-16: qty 0.5

## 2016-03-16 NOTE — ED Notes (Signed)
Silvestre Moment , MD. ED at [facility] . [phone]
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
## 2016-03-16 NOTE — ED Notes (Signed)
Ointment placed on Rt foot. Covered.
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
## 2016-03-16 NOTE — ED Notes (Signed)
Pt states he walks with a walker at home, also has wheelchair at home as well.

## 2016-03-17 DIAGNOSIS — T310 Burns involving less than 10% of body surface: Secondary | ICD-10-CM | POA: Insufficient documentation

## 2016-03-22 ENCOUNTER — Ambulatory Visit: Payer: No Typology Code available for payment source

## 2016-03-28 ENCOUNTER — Ambulatory Visit: Payer: Self-pay | Admitting: *Deleted

## 2016-03-28 VITALS — BP 94/62

## 2016-03-28 DIAGNOSIS — N186 End stage renal disease: Secondary | ICD-10-CM

## 2016-03-28 DIAGNOSIS — B20 Human immunodeficiency virus [HIV] disease: Secondary | ICD-10-CM

## 2016-03-28 DIAGNOSIS — Z992 Dependence on renal dialysis: Secondary | ICD-10-CM

## 2016-03-29 ENCOUNTER — Ambulatory Visit: Payer: Self-pay | Admitting: *Deleted

## 2016-03-29 DIAGNOSIS — T25029A Burn of unspecified degree of unspecified foot, initial encounter: Secondary | ICD-10-CM

## 2016-03-29 DIAGNOSIS — B20 Human immunodeficiency virus [HIV] disease: Secondary | ICD-10-CM

## 2016-04-01 ENCOUNTER — Encounter (HOSPITAL_COMMUNITY): Payer: Self-pay | Admitting: Family Medicine

## 2016-04-01 ENCOUNTER — Emergency Department (HOSPITAL_COMMUNITY): Payer: Self-pay

## 2016-04-01 ENCOUNTER — Inpatient Hospital Stay (HOSPITAL_COMMUNITY)
Admission: EM | Admit: 2016-04-01 | Discharge: 2016-04-04 | DRG: 922 | Disposition: A | Discharge status: Home / Self Care | Payer: No Typology Code available for payment source | Attending: Internal Medicine | Admitting: Internal Medicine

## 2016-04-01 DIAGNOSIS — I9589 Other hypotension: Secondary | ICD-10-CM | POA: Diagnosis present

## 2016-04-01 DIAGNOSIS — T25021D Burn of unspecified degree of right foot, subsequent encounter: Secondary | ICD-10-CM

## 2016-04-01 DIAGNOSIS — Z992 Dependence on renal dialysis: Secondary | ICD-10-CM

## 2016-04-01 DIAGNOSIS — Z833 Family history of diabetes mellitus: Secondary | ICD-10-CM

## 2016-04-01 DIAGNOSIS — N189 Chronic kidney disease, unspecified: Secondary | ICD-10-CM

## 2016-04-01 DIAGNOSIS — B20 Human immunodeficiency virus [HIV] disease: Secondary | ICD-10-CM | POA: Diagnosis present

## 2016-04-01 DIAGNOSIS — Z89512 Acquired absence of left leg below knee: Secondary | ICD-10-CM

## 2016-04-01 DIAGNOSIS — Z7984 Long term (current) use of oral hypoglycemic drugs: Secondary | ICD-10-CM

## 2016-04-01 DIAGNOSIS — B182 Chronic viral hepatitis C: Secondary | ICD-10-CM | POA: Diagnosis present

## 2016-04-01 DIAGNOSIS — K529 Noninfective gastroenteritis and colitis, unspecified: Secondary | ICD-10-CM | POA: Diagnosis present

## 2016-04-01 DIAGNOSIS — E1140 Type 2 diabetes mellitus with diabetic neuropathy, unspecified: Secondary | ICD-10-CM | POA: Diagnosis present

## 2016-04-01 DIAGNOSIS — B1920 Unspecified viral hepatitis C without hepatic coma: Secondary | ICD-10-CM | POA: Diagnosis present

## 2016-04-01 DIAGNOSIS — Z8614 Personal history of Methicillin resistant Staphylococcus aureus infection: Secondary | ICD-10-CM

## 2016-04-01 DIAGNOSIS — T799XXD Unspecified early complication of trauma, subsequent encounter: Secondary | ICD-10-CM

## 2016-04-01 DIAGNOSIS — E43 Unspecified severe protein-calorie malnutrition: Secondary | ICD-10-CM | POA: Diagnosis present

## 2016-04-01 DIAGNOSIS — N2581 Secondary hyperparathyroidism of renal origin: Secondary | ICD-10-CM | POA: Diagnosis present

## 2016-04-01 DIAGNOSIS — E1122 Type 2 diabetes mellitus with diabetic chronic kidney disease: Secondary | ICD-10-CM | POA: Diagnosis present

## 2016-04-01 DIAGNOSIS — K5190 Ulcerative colitis, unspecified, without complications: Secondary | ICD-10-CM | POA: Diagnosis present

## 2016-04-01 DIAGNOSIS — N186 End stage renal disease: Secondary | ICD-10-CM

## 2016-04-01 DIAGNOSIS — T25031D Burn of unspecified degree of right toe(s) (nail), subsequent encounter: Secondary | ICD-10-CM

## 2016-04-01 DIAGNOSIS — R52 Pain, unspecified: Secondary | ICD-10-CM

## 2016-04-01 DIAGNOSIS — I120 Hypertensive chronic kidney disease with stage 5 chronic kidney disease or end stage renal disease: Secondary | ICD-10-CM | POA: Diagnosis present

## 2016-04-01 DIAGNOSIS — Z681 Body mass index (BMI) 19 or less, adult: Secondary | ICD-10-CM

## 2016-04-01 DIAGNOSIS — Z21 Asymptomatic human immunodeficiency virus [HIV] infection status: Secondary | ICD-10-CM | POA: Diagnosis present

## 2016-04-01 DIAGNOSIS — T68XXXA Hypothermia, initial encounter: Principal | ICD-10-CM | POA: Diagnosis present

## 2016-04-01 DIAGNOSIS — X118XXD Contact with other hot tap-water, subsequent encounter: Secondary | ICD-10-CM | POA: Diagnosis present

## 2016-04-01 DIAGNOSIS — E11649 Type 2 diabetes mellitus with hypoglycemia without coma: Secondary | ICD-10-CM | POA: Diagnosis not present

## 2016-04-01 DIAGNOSIS — B9689 Other specified bacterial agents as the cause of diseases classified elsewhere: Secondary | ICD-10-CM | POA: Diagnosis present

## 2016-04-01 DIAGNOSIS — D631 Anemia in chronic kidney disease: Secondary | ICD-10-CM | POA: Diagnosis present

## 2016-04-01 DIAGNOSIS — X31XXXA Exposure to excessive natural cold, initial encounter: Secondary | ICD-10-CM

## 2016-04-01 LAB — CBC WITH DIFFERENTIAL/PLATELET
BASOS ABS: 0 10*3/uL (ref 0.0–0.1)
BASOS PCT: 0 %
Eosinophils Absolute: 0 10*3/uL (ref 0.0–0.7)
Eosinophils Relative: 0 %
HEMATOCRIT: 33.2 % — AB (ref 39.0–52.0)
Hemoglobin: 11 g/dL — ABNORMAL LOW (ref 13.0–17.0)
Lymphocytes Relative: 6 %
Lymphs Abs: 1.1 10*3/uL (ref 0.7–4.0)
MCH: 32.6 pg (ref 26.0–34.0)
MCHC: 33.1 g/dL (ref 30.0–36.0)
MCV: 98.5 fL (ref 78.0–100.0)
MONO ABS: 0.6 10*3/uL (ref 0.1–1.0)
Monocytes Relative: 3 %
NEUTROS ABS: 18.3 10*3/uL — AB (ref 1.7–7.7)
Neutrophils Relative %: 91 %
PLATELETS: 394 10*3/uL (ref 150–400)
RBC: 3.37 MIL/uL — ABNORMAL LOW (ref 4.22–5.81)
RDW: 14.6 % (ref 11.5–15.5)
WBC: 20.1 10*3/uL — ABNORMAL HIGH (ref 4.0–10.5)

## 2016-04-01 LAB — I-STAT TROPONIN, ED: Troponin i, poc: 0 ng/mL (ref 0.00–0.08)

## 2016-04-01 MED ORDER — ONDANSETRON HCL 4 MG/2ML IJ SOLN
4.0000 mg | Freq: Once | INTRAMUSCULAR | Status: AC
  Administered 2016-04-01: 4 mg via INTRAVENOUS
  Filled 2016-04-01: qty 2

## 2016-04-01 NOTE — ED Notes (Signed)
Used Freida Busman [phone] to interpreter for triage and assessment.
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
## 2016-04-01 NOTE — ED Provider Notes (Signed)
[facility] DEPT Provider Note   CSN: [id number] Arrival date & time: 04/01/16  2307   By signing my name below, I, Julien Nordmann, attest that this documentation has been prepared under the direction and in the presence of Omaira Mellen, MD.  Electronically Signed: Julien Nordmann, ED Scribe. 04/01/16. 11:37 PM.   History   Chief Complaint Chief Complaint  Patient presents with  . Cold Exposure    The history is provided by the patient. A language interpreter was used.  Abdominal Pain   This is a new problem. The current episode started 12 to 24 hours ago. The problem occurs rarely. The problem has not changed since onset.The pain is associated with an unknown factor. The pain is located in the epigastric region. The pain is moderate. Associated symptoms include diarrhea, nausea and vomiting. Pertinent negatives include fever. Nothing aggravates the symptoms. Nothing relieves the symptoms. Past workup includes CT scan. His past medical history is significant for ulcerative colitis.   HPI Comments: Yonis Wenker is a 45 y.o. male brought in by ambulance, who has a PMHx of AIDS, hepatitis C, ESRD, and MRSA presents to the Emergency Department by EMS for upper abdominal pain x this morning ~ 8am. He states having associated vomiting, chronic diarrhea, and chills. Pt says his abdominal pain started today before his dialysis treatment. Pt is a MWF dialysis pt. When EMS found pt, he was inside of an unheated apartment and "shaving violently" which is most likely the cause of his chills. Pt was admitted on 10/22 for ulcerative colitis with a WBC of 14.  He does not have any other complaints.   Past Medical History:  Diagnosis Date  . AIDS ([place]) 11/22/2014  . Chronic diarrhea   . Chronic hepatitis C without hepatic coma ([place]) 11/22/2014  . Diabetic neuropathy ([place])   . ESRD (end stage renal disease) on dialysis [facility])    "TTS; don't remember street name" (05/03/2014)  . Hepatitis C    . HIV INFECTION 06/27/2010   Qualifier: Diagnosis of  By: Nickola Major CMA ( [place]), Geni Bers    . Hypotension 06/02/2012  . Metabolic bone disease 01/18/9891  . MRSA infection   . Normocytic anemia 06/17/2012  . Pancreatitis   . Pressure ulcer of BKA stump, stage 2 ([place]) 11/22/2014  . Renal disorder   . Severe protein-calorie malnutrition ([place]) 06/17/2012  . Uncontrolled diabetes mellitus with complications ([place]) 05/31/4172   Annotation: uncontrolled Qualifier: Diagnosis of  By: Nickola Major CMA Deborra Medina), Jacqueline      Patient Active Problem List   Diagnosis Date Noted  . Colitis   . Shock ([place]) 03/03/2016  . Pressure ulcer of BKA stump, stage 2 ([place]) 11/22/2014  . Chronic hepatitis C without hepatic coma ([place]) 11/22/2014  . AIDS ([place]) 11/22/2014  . Below knee amputation status ([place]) 06/17/2014  . Diabetic osteomyelitis ([place])   . Hepatitis C virus infection without hepatic coma   . End stage renal failure on dialysis ([place])   . Blood poisoning ([place])   . Poor dentition 10/25/2013  . ESRD on dialysis ([place]) 10/25/2013  . Vision changes 06/21/2013  . Abdominal pain 04/19/2013  . Orthostatic hypotension 04/15/2013  . Orthostatic headache 04/15/2013  . Anemia of chronic renal failure 06/19/2012  . Headache(784.0) 06/18/2012  . Metabolic bone disease 12/24/4816  . Chronic diarrhea 06/17/2012  . Severe protein-calorie malnutrition ([place]) 06/17/2012  . Normocytic anemia 06/17/2012  . Syncope and collapse 06/02/2012  . Human immunodeficiency virus (HIV) disease ([place]) 06/27/2010  .  RENAL FAILURE, END STAGE 06/27/2010  . Polyneuropathy in diabetes(357.2) 06/27/2010  . Diabetes mellitus with ESRD (end-stage renal disease) ([place]) 01/03/2010    Past Surgical History:  Procedure Laterality Date  . AMPUTATION Left 04/20/2014   Procedure: 3rd toe amputation, 4th Toe Amputation,  5th Toe Amputation;  Surgeon: Newt Minion, MD;  Location: [facility];  Service: Orthopedics;  Laterality: Left;  . AMPUTATION  Left 05/02/2014   Procedure: Midfoot Amputation;  Surgeon: Newt Minion, MD;  Location: [facility];  Service: Orthopedics;  Laterality: Left;  . AMPUTATION Left 06/17/2014   Procedure: AMPUTATION BELOW KNEE;  Surgeon: Newt Minion, MD;  Location: [facility];  Service: Orthopedics;  Laterality: Left;  . AV FISTULA PLACEMENT Left   . FOOT AMPUTATION THROUGH ANKLE Left 12/'21/2015   midfoot       Home Medications    Prior to Admission medications   Medication Sig Start Date End Date Taking? Authorizing Provider  amoxicillin-clavulanate (AUGMENTIN) 500-125 MG tablet Take 1 tablet (500 mg total) by mouth daily. Patient not taking: Reported on 03/16/2016 03/07/16   Marijean Heath, NP  HYDROcodone-acetaminophen (NORCO) 5-325 MG tablet Take 1 tablet by mouth every 6 (six) hours as needed. Patient taking differently: Take 1 tablet by mouth every 6 (six) hours as needed for moderate pain.  01/22/16   Tatyana Kirichenko, PA-C  lamivudine (EPIVIR) 100 MG tablet Take 0.5 tablets (50 mg total) by mouth daily. Patient not taking: Reported on 03/16/2016 03/06/16   Marijean Heath, NP  lanthanum (FOSRENOL) 500 MG chewable tablet Chew 1 tablet (500 mg total) by mouth 3 (three) times daily with meals. Patient not taking: Reported on 03/16/2016 03/07/16   Marijean Heath, NP  methocarbamol (ROBAXIN) 500 MG tablet Take 1 tablet (500 mg total) by mouth every 6 (six) hours as needed for muscle spasms. Patient not taking: Reported on 03/16/2016 04/22/14   Vivi Barrack, MD  midodrine (PROAMATINE) 10 MG tablet Take 1 tablet (10 mg total) by mouth 3 (three) times daily with meals. Patient not taking: Reported on 03/16/2016 03/11/16   Carlyle Basques, MD  multivitamin (RENA-VIT) TABS tablet Take 1 tablet by mouth at bedtime. Patient not taking: Reported on 03/16/2016 03/06/16   Marijean Heath, NP  omeprazole (PRILOSEC) 40 MG capsule TAKE 1 CAPSULE BY MOUTH EVERY DAY Patient not taking: Reported on 03/16/2016  09/14/15   Campbell Riches, MD  tenofovir (VIREAD) 300 MG tablet TAKE 1 TABLET BY MOUTH ONCE A WEEK ON SUNDAY Patient not taking: Reported on 03/16/2016 02/23/16   Truman Hayward, MD  TIVICAY 50 MG tablet TAKE 1 TABLET BY MOUTH EVERY DAY Patient not taking: Reported on 03/16/2016 02/23/16   Truman Hayward, MD    Family History Family History  Problem Relation Age of Onset  . Diabetes Mother   . Diabetes Father     Social History Social History  Substance Use Topics  . Smoking status: Never Smoker  . Smokeless tobacco: Never Used  . Alcohol use No     Allergies   Patient has no known allergies.   Review of Systems Review of Systems  Constitutional: Positive for chills. Negative for fever.  Eyes: Negative for photophobia.  Gastrointestinal: Positive for abdominal pain, diarrhea, nausea and vomiting.  All other systems reviewed and are negative.    Physical Exam Updated Vital Signs BP 113/66 (BP Location: Right Arm)   Pulse 102   Resp 20   SpO2 100%  Physical Exam  Constitutional: He is oriented to person, place, and time. He appears well-developed and well-nourished.  HENT:  Head: Normocephalic and atraumatic.  Mouth/Throat: Oropharynx is clear and moist. No oropharyngeal exudate.  Eyes: Conjunctivae and EOM are normal. Pupils are equal, round, and reactive to light.  Neck: Normal range of motion. Neck supple. No JVD present. No tracheal deviation present.  No carotid bruits. Trachea midline.   Cardiovascular: Normal rate, regular rhythm and normal heart sounds.  Exam reveals no gallop and no friction rub.   No murmur heard. RRR.   Pulmonary/Chest: Effort normal and breath sounds normal. No stridor. No respiratory distress. He has no wheezes. He has no rales.  Lungs CTA bilaterally.   Abdominal: Soft. Bowel sounds are normal. He exhibits no distension. There is no rebound and no guarding.  Musculoskeletal: Normal range of motion.  Lymphadenopathy:     He has no cervical adenopathy.  Neurological: He is alert and oriented to person, place, and time. He has normal reflexes.  Foot to mid shin amputation, pulses intact  Skin: Skin is warm and dry. Capillary refill takes less than 2 seconds.  Psychiatric: He has a normal mood and affect.  Nursing note and vitals reviewed.    ED Treatments / Results   Vitals:   04/01/16 2317 04/01/16 2340  BP: 113/66   Pulse: 102   Resp: 20   Temp:  (!) 94.6 F (34.8 C)    DIAGNOSTIC STUDIES: Oxygen Saturation is 100% on RA, normal by my interpretation.  COORDINATION OF CARE: 11:36 PM Discussed treatment plan with pt at bedside and pt agreed to plan.   Results for orders placed or performed during the hospital encounter of 04/01/16  CBC with Differential/Platelet  Result Value Ref Range   WBC 20.1 (H) 4.0 - 10.5 K/uL   RBC 3.37 (L) 4.22 - 5.81 MIL/uL   Hemoglobin 11.0 (L) 13.0 - 17.0 g/dL   HCT 33.2 (L) 39.0 - 52.0 %   MCV 98.5 78.0 - 100.0 fL   MCH 32.6 26.0 - 34.0 pg   MCHC 33.1 30.0 - 36.0 g/dL   RDW 14.6 11.5 - 15.5 %   Platelets 394 150 - 400 K/uL   Neutrophils Relative % 91 %   Neutro Abs 18.3 (H) 1.7 - 7.7 K/uL   Lymphocytes Relative 6 %   Lymphs Abs 1.1 0.7 - 4.0 K/uL   Monocytes Relative 3 %   Monocytes Absolute 0.6 0.1 - 1.0 K/uL   Eosinophils Relative 0 %   Eosinophils Absolute 0.0 0.0 - 0.7 K/uL   Basophils Relative 0 %   Basophils Absolute 0.0 0.0 - 0.1 K/uL  Comprehensive metabolic panel  Result Value Ref Range   Sodium 135 135 - 145 mmol/L   Potassium 3.4 (L) 3.5 - 5.1 mmol/L   Chloride 97 (L) 101 - 111 mmol/L   CO2 26 22 - 32 mmol/L   Glucose, Bld 71 65 - 99 mg/dL   BUN 9 6 - 20 mg/dL   Creatinine, Ser 3.94 (H) 0.61 - 1.24 mg/dL   Calcium 8.4 (L) 8.9 - 10.3 mg/dL   Total Protein 7.7 6.5 - 8.1 g/dL   Albumin 2.1 (L) 3.5 - 5.0 g/dL   AST 32 15 - 41 U/L   ALT 10 (L) 17 - 63 U/L   Alkaline Phosphatase 78 38 - 126 U/L   Total Bilirubin 0.5 0.3 - 1.2 mg/dL   GFR  calc non Af Amer 17 (L) >60 mL/min  GFR calc Af Amer 20 (L) >60 mL/min   Anion gap 12 5 - 15  Lipase, blood  Result Value Ref Range   Lipase 71 (H) 11 - 51 U/L  I-stat troponin, ED  Result Value Ref Range   Troponin i, poc 0.00 0.00 - 0.08 ng/mL   Comment 3           Ct Abdomen Pelvis W Contrast  Result Date: 03/05/2016 CLINICAL DATA:  Generalized abdominal pain, chronic diarrhea for 3 months. EXAM: CT ABDOMEN AND PELVIS WITH CONTRAST TECHNIQUE: Multidetector CT imaging of the abdomen and pelvis was performed using the standard protocol following bolus administration of intravenous contrast. CONTRAST:  155mL ISOVUE-300 IOPAMIDOL (ISOVUE-300) INJECTION 61% COMPARISON:  CT scan of April 19, 2013. FINDINGS: Lower chest: Mild haziness of both visualized lung bases is noted suggesting possible early edema. Hepatobiliary: No gallstones are noted. Liver appears normal. Stable dilatation of common bile duct and intrahepatic ducts is noted. Pancreas: Stable dilatation of pancreatic duct is noted. Spleen: Normal. Adrenals/Urinary Tract: Adrenal glands and kidneys appear normal. No hydronephrosis or renal obstruction is noted. Urinary bladder is decompressed. Stomach/Bowel: Severe wall thickening is seen involving the distal transverse colon, descending and sigmoid colon and rectum consistent with colitis. There is no evidence of small bowel dilatation. Vascular/Lymphatic: Atherosclerosis of abdominal aorta is noted without aneurysm formation. No significant adenopathy is noted. Reproductive: Prostate gland is unremarkable. Other: Minimal amount of free fluid is noted in the pelvis. Musculoskeletal: No significant osseous abnormality is noted. IMPRESSION: Mild haziness of both visualized lung base is noted suggesting possible early edema. Stable dilatation of common bile duct and intrahepatic ducts and pancreatic duct is noted compared to prior exam. Aortic atherosclerosis. Severe wall thickening is seen  involving the distal transverse, descending and sigmoid colon as well as rectum consistent with infectious or inflammatory colitis. Minimal amount of free fluid is noted in the pelvis. Electronically Signed   By: Marijo Conception, M.D.   On: 03/05/2016 07:38   Mr Foot Right Wo Contrast  Result Date: 03/06/2016 CLINICAL DATA:  Pain and swelling of the right foot. History of remote calcaneal fracture. EXAM: MRI OF THE RIGHT FOREFOOT WITHOUT CONTRAST TECHNIQUE: Multiplanar, multisequence MR imaging was performed. No intravenous contrast was administered. COMPARISON:  CT scan 01/22/2016 FINDINGS: Evidence of a remote ununited centrally depressed calcaneus fracture. A large portion of the posterior facet has dark T1 and T2 signal intensity consistent with necrotic bone. The cortical margins are smooth and intact. I do not see any obvious destructive bony changes of advanced osteomyelitis. There is fluid in the ununited fracture lines. Diffuse patchy abnormal marrow signal noted in the distal tibia, talus, cuboid and scattered cuneiforms and navicular bone. This is all probably stress related change due to the ununited calcaneus fracture. Osteomyelitis cannot be totally excluded. Diffuse signal abnormality in the ankle and foot musculature is likely stress related or nonspecific myositis. I do not see any significant findings for cellulitis or soft tissue abscess. IMPRESSION: 1. Chronic ununited calcaneal fracture. A segment of the posterior calcaneal facet is likely necrotic given its signal characteristics. 2. Diffuse patchy marrow signal abnormality is most likely stress related change due to altered mechanics and un stable, ununited calcaneus fracture. Could not totally exclude the possibility of osteomyelitis. Recommend correlation with clinical findings. 3. Diffuse muscle signal abnormality likely stress related change or disuse. 4. No findings for soft tissue abscess or significant cellulitis. Electronically  Signed   By: Ricky Stabs.D.  On: 03/06/2016 09:05   Dg Chest Port 1 View  Result Date: 03/03/2016 CLINICAL DATA:  Bilateral flank pain EXAM: PORTABLE CHEST 1 VIEW COMPARISON:  02/17/2015 FINDINGS: Cardiac shadow is within normal limits. The lungs are clear bilaterally. No acute bony abnormality is seen. IMPRESSION: No active disease. Electronically Signed   By: Inez Catalina M.D.   On: 03/03/2016 14:58   Ct Renal Stone Study  Result Date: 04/02/2016 CLINICAL DATA:  Abdominal pain for 1 day after dialysis. Nausea and vomiting. Chronic diarrhea. White cell count 20.1. Elevated lipase of 71. History of AIDS, hepatitis-C, end-stage renal disease, MR SA, and pancreatitis. EXAM: CT ABDOMEN AND PELVIS WITHOUT CONTRAST TECHNIQUE: Multidetector CT imaging of the abdomen and pelvis was performed following the standard protocol without IV contrast. COMPARISON:  03/04/2016 FINDINGS: Lower chest: Motion artifact.  Lung bases are grossly clear. Hepatobiliary: No focal liver abnormality is seen. No gallstones, gallbladder wall thickening, or biliary dilatation. Pancreas: Unremarkable. No pancreatic ductal dilatation or surrounding inflammatory changes. Spleen: Normal in size without focal abnormality. Adrenals/Urinary Tract: Calcifications demonstrated throughout both kidneys at the corticomedullary junctions possibly representing stones or medullary calcinosis. Similar appearance to previous study. Bladder is decompressed. No adrenal gland nodules. Stomach/Bowel: Stomach is grossly unremarkable. Small bowel and colon are mostly decompressed. Colon and terminal ileum demonstrates diffuse wall thickening suggesting inflammatory process. This could be due to infectious or inflammatory colitis. Similar changes were present previously. Vascular/Lymphatic: Scattered calcifications in the aorta. No aortic aneurysm. No significant lymphadenopathy in the retroperitoneum. Reproductive: Prostate is unremarkable. Other: No free  air or free fluid in the abdomen. Mild mesenteric edema of nonspecific etiology. Abdominal wall musculature appears intact. Musculoskeletal: No acute or significant osseous findings. Examination is technically limited due to motion artifact. IMPRESSION: Multiple bilateral intrarenal calcifications without evidence of obstruction. Diffuse wall thickening of the colon and terminal ileum suggesting infectious or inflammatory bowel disease. No evidence of obstruction. Electronically Signed   By: Lucienne Capers M.D.   On: 04/02/2016 00:58     EKG Interpretation  Date/Time:  Tuesday April 02 2016 00:04:52 EST Ventricular Rate:  105 PR Interval:    QRS Duration: 112 QT Interval:  390 QTC Calculation: 516 R Axis:   14 Text Interpretation:  Sinus tachycardia Borderline intraventricular conduction delay Prolonged QT interval Confirmed by [facility]  MD, Nevaeh Korte ([id number]) on 04/02/2016 12:29:12 AM       Procedures Procedures (including critical care time)  Medications Ordered in ED Medications  ciprofloxacin (CIPRO) IVPB 400 mg (not administered)  metroNIDAZOLE (FLAGYL) IVPB 500 mg (not administered)  fentaNYL (SUBLIMAZE) injection 50 mcg (not administered)  ondansetron (ZOFRAN) injection 4 mg (4 mg Intravenous Given 04/01/16 2337)   Final Clinical Impressions(s) / ED Diagnoses  Colitis with hypothermia secondary to being in an unheated apartment.  Given chills and leukocytosis will need admission and IV antibiotics   Tyrion Glaude, MD 04/02/16 0140

## 2016-04-01 NOTE — ED Notes (Signed)
Bed: WA20 Expected date:  Expected time:  Means of arrival:  Comments: 45 yr old, hypothermia

## 2016-04-02 ENCOUNTER — Encounter (HOSPITAL_COMMUNITY): Payer: Self-pay | Admitting: Emergency Medicine

## 2016-04-02 DIAGNOSIS — E43 Unspecified severe protein-calorie malnutrition: Secondary | ICD-10-CM

## 2016-04-02 DIAGNOSIS — K529 Noninfective gastroenteritis and colitis, unspecified: Secondary | ICD-10-CM

## 2016-04-02 DIAGNOSIS — T68XXXA Hypothermia, initial encounter: Secondary | ICD-10-CM | POA: Diagnosis present

## 2016-04-02 DIAGNOSIS — D631 Anemia in chronic kidney disease: Secondary | ICD-10-CM

## 2016-04-02 DIAGNOSIS — B20 Human immunodeficiency virus [HIV] disease: Secondary | ICD-10-CM

## 2016-04-02 DIAGNOSIS — E1122 Type 2 diabetes mellitus with diabetic chronic kidney disease: Secondary | ICD-10-CM

## 2016-04-02 DIAGNOSIS — B1920 Unspecified viral hepatitis C without hepatic coma: Secondary | ICD-10-CM

## 2016-04-02 DIAGNOSIS — N185 Chronic kidney disease, stage 5: Secondary | ICD-10-CM

## 2016-04-02 DIAGNOSIS — N186 End stage renal disease: Secondary | ICD-10-CM

## 2016-04-02 DIAGNOSIS — Z992 Dependence on renal dialysis: Secondary | ICD-10-CM

## 2016-04-02 LAB — COMPREHENSIVE METABOLIC PANEL
ALT: 10 U/L — ABNORMAL LOW (ref 17–63)
AST: 32 U/L (ref 15–41)
Albumin: 2.1 g/dL — ABNORMAL LOW (ref 3.5–5.0)
Alkaline Phosphatase: 78 U/L (ref 38–126)
Anion gap: 12 (ref 5–15)
BILIRUBIN TOTAL: 0.5 mg/dL (ref 0.3–1.2)
BUN: 9 mg/dL (ref 6–20)
CHLORIDE: 97 mmol/L — AB (ref 101–111)
CO2: 26 mmol/L (ref 22–32)
CREATININE: 3.94 mg/dL — AB (ref 0.61–1.24)
Calcium: 8.4 mg/dL — ABNORMAL LOW (ref 8.9–10.3)
GFR, EST AFRICAN AMERICAN: 20 mL/min — AB (ref >60)
GFR, EST NON AFRICAN AMERICAN: 17 mL/min — AB (ref >60)
Glucose, Bld: 71 mg/dL (ref 65–99)
POTASSIUM: 3.4 mmol/L — AB (ref 3.5–5.1)
Sodium: 135 mmol/L (ref 135–145)
TOTAL PROTEIN: 7.7 g/dL (ref 6.5–8.1)

## 2016-04-02 LAB — GLUCOSE, CAPILLARY
GLUCOSE-CAPILLARY: 101 mg/dL — AB (ref 65–99)
GLUCOSE-CAPILLARY: 51 mg/dL — AB (ref 65–99)
GLUCOSE-CAPILLARY: 119 mg/dL — AB (ref 65–99)
Glucose-Capillary: 161 mg/dL — ABNORMAL HIGH (ref 65–99)
Glucose-Capillary: 175 mg/dL — ABNORMAL HIGH (ref 65–99)
Glucose-Capillary: 83 mg/dL (ref 65–99)

## 2016-04-02 LAB — CBC
HEMATOCRIT: 27.9 % — AB (ref 39.0–52.0)
Hemoglobin: 9.3 g/dL — ABNORMAL LOW (ref 13.0–17.0)
MCH: 32.7 pg (ref 26.0–34.0)
MCHC: 33.3 g/dL (ref 30.0–36.0)
MCV: 98.2 fL (ref 78.0–100.0)
Platelets: 371 10*3/uL (ref 150–400)
RBC: 2.84 MIL/uL — ABNORMAL LOW (ref 4.22–5.81)
RDW: 14.6 % (ref 11.5–15.5)
WBC: 18.1 10*3/uL — AB (ref 4.0–10.5)

## 2016-04-02 LAB — MRSA PCR SCREENING: MRSA by PCR: NEGATIVE

## 2016-04-02 LAB — LACTIC ACID, PLASMA
LACTIC ACID, VENOUS: 0.9 mmol/L (ref 0.5–1.9)
Lactic Acid, Venous: 0.8 mmol/L (ref 0.5–1.9)

## 2016-04-02 LAB — LIPASE, BLOOD: LIPASE: 71 U/L — AB (ref 11–51)

## 2016-04-02 MED ORDER — LANTHANUM CARBONATE 500 MG PO CHEW
500.0000 mg | CHEWABLE_TABLET | Freq: Three times a day (TID) | ORAL | Status: DC
  Administered 2016-04-02 – 2016-04-04 (×6): 500 mg via ORAL
  Filled 2016-04-02 (×8): qty 1

## 2016-04-02 MED ORDER — SILVER SULFADIAZINE 1 % EX CREA
TOPICAL_CREAM | Freq: Every day | CUTANEOUS | Status: DC
  Administered 2016-04-03: 17:00:00 via TOPICAL
  Filled 2016-04-02 (×2): qty 85

## 2016-04-02 MED ORDER — LAMIVUDINE 10 MG/ML PO SOLN
100.0000 mg | Freq: Every day | ORAL | Status: DC
  Administered 2016-04-02: 100 mg via ORAL
  Filled 2016-04-02: qty 10

## 2016-04-02 MED ORDER — INSULIN ASPART 100 UNIT/ML ~~LOC~~ SOLN
0.0000 [IU] | Freq: Three times a day (TID) | SUBCUTANEOUS | Status: DC
  Administered 2016-04-02 (×2): 2 [IU] via SUBCUTANEOUS
  Administered 2016-04-03: 1 [IU] via SUBCUTANEOUS
  Administered 2016-04-04: 2 [IU] via SUBCUTANEOUS
  Administered 2016-04-04: 1 [IU] via SUBCUTANEOUS

## 2016-04-02 MED ORDER — INSULIN ASPART 100 UNIT/ML ~~LOC~~ SOLN
0.0000 [IU] | Freq: Every day | SUBCUTANEOUS | Status: DC
Start: 2016-04-02 — End: 2016-04-04

## 2016-04-02 MED ORDER — CIPROFLOXACIN IN D5W 400 MG/200ML IV SOLN
400.0000 mg | INTRAVENOUS | Status: DC
  Administered 2016-04-02 – 2016-04-03 (×2): 400 mg via INTRAVENOUS
  Filled 2016-04-02 (×2): qty 200

## 2016-04-02 MED ORDER — ENSURE ENLIVE PO LIQD
237.0000 mL | Freq: Two times a day (BID) | ORAL | Status: DC
  Administered 2016-04-04: 237 mL via ORAL

## 2016-04-02 MED ORDER — METRONIDAZOLE 500 MG PO TABS
500.0000 mg | ORAL_TABLET | Freq: Three times a day (TID) | ORAL | Status: DC
  Administered 2016-04-02 – 2016-04-04 (×6): 500 mg via ORAL
  Filled 2016-04-02 (×6): qty 1

## 2016-04-02 MED ORDER — LAMIVUDINE 10 MG/ML PO SOLN
25.0000 mg | Freq: Every day | ORAL | Status: DC
  Administered 2016-04-03 – 2016-04-04 (×2): 25 mg via ORAL
  Filled 2016-04-02 (×2): qty 5

## 2016-04-02 MED ORDER — MIDODRINE HCL 5 MG PO TABS
10.0000 mg | ORAL_TABLET | Freq: Three times a day (TID) | ORAL | Status: DC
  Administered 2016-04-02 – 2016-04-04 (×9): 10 mg via ORAL
  Filled 2016-04-02 (×9): qty 2

## 2016-04-02 MED ORDER — LAMIVUDINE 100 MG PO TABS
100.0000 mg | ORAL_TABLET | Freq: Every day | ORAL | Status: DC
  Filled 2016-04-02: qty 1

## 2016-04-02 MED ORDER — TENOFOVIR DISOPROXIL FUMARATE 300 MG PO TABS
300.0000 mg | ORAL_TABLET | ORAL | Status: DC
Start: 2016-04-07 — End: 2016-04-04

## 2016-04-02 MED ORDER — ONDANSETRON HCL 4 MG/2ML IJ SOLN: 4.0000 mg | Freq: Four times a day (QID) | INTRAMUSCULAR | Status: DC | PRN

## 2016-04-02 MED ORDER — METRONIDAZOLE IN NACL 5-0.79 MG/ML-% IV SOLN
500.0000 mg | Freq: Once | INTRAVENOUS | Status: AC
  Administered 2016-04-02: 500 mg via INTRAVENOUS
  Filled 2016-04-02: qty 100

## 2016-04-02 MED ORDER — HEPARIN SODIUM (PORCINE) 5000 UNIT/ML IJ SOLN
5000.0000 [IU] | Freq: Three times a day (TID) | INTRAMUSCULAR | Status: DC
  Administered 2016-04-02 – 2016-04-04 (×6): 5000 [IU] via SUBCUTANEOUS
  Filled 2016-04-02 (×5): qty 1

## 2016-04-02 MED ORDER — ONDANSETRON HCL 4 MG PO TABS: 4.0000 mg | ORAL_TABLET | Freq: Four times a day (QID) | ORAL | Status: DC | PRN

## 2016-04-02 MED ORDER — FENTANYL CITRATE (PF) 100 MCG/2ML IJ SOLN
50.0000 ug | INTRAMUSCULAR | Status: DC | PRN
  Administered 2016-04-02 – 2016-04-03 (×2): 50 ug via INTRAVENOUS
  Filled 2016-04-02 (×2): qty 2

## 2016-04-02 MED ORDER — FENTANYL CITRATE (PF) 100 MCG/2ML IJ SOLN
50.0000 ug | Freq: Once | INTRAMUSCULAR | Status: AC
  Administered 2016-04-02: 50 ug via INTRAVENOUS
  Filled 2016-04-02: qty 2

## 2016-04-02 MED ORDER — DOLUTEGRAVIR SODIUM 50 MG PO TABS
50.0000 mg | ORAL_TABLET | Freq: Every day | ORAL | Status: DC
  Administered 2016-04-02 – 2016-04-04 (×3): 50 mg via ORAL
  Filled 2016-04-02 (×3): qty 1

## 2016-04-02 MED ORDER — CIPROFLOXACIN IN D5W 400 MG/200ML IV SOLN
400.0000 mg | Freq: Once | INTRAVENOUS | Status: AC
  Administered 2016-04-02: 400 mg via INTRAVENOUS
  Filled 2016-04-02: qty 200

## 2016-04-02 NOTE — H&P (Addendum)
History and Physical  Patient Name: Alvin Daniels     YFV:[id number]    DOB: [birth year]    DOA: 04/01/2016 PCP: [name], MD   Patient coming from: Home  Chief Complaint: Abdominal pain  HPI: Alvin Daniels is a 45 y.o. male with a past medical history significant for HIV well-controlled VL undetectable Oct 2017, hepatitis C s/p treatment, NIDDM, and ESRD on HD MWF who presents with abdominal pain for 1 day.  All history collected through telephonic interpreter.   The patient was admitted to [facility] for four days during October for new severe lower abdominal pain.  He had CT findings of colitis at that time, negative GI pathogen panel and C diff PCR, was seen by ID, was treated with IV vanc/Zosyn, had complete resolution of pain and was discharged on Augmentin to complete a 10 day course with recommendations for outpatient colonoscopy.  In the interim he was admitted to [facility] for foot burn, which is healing well.  Since being discharged from [facility] 1 week ago, he has been back at home with his brother doing well.  Today, he went to dialysis as usual, afterwards came home, ate lunch and lay down, when he had onset of severe periumbilical pain, not radiating, constant.  This was not associated with vomiting, change in his chronic diarrhea, fever, hematochezia.  The abdominal pain did not abate for hours, so he called EMS who found him on the floor of an unheated apartment shivering uncontrollably.  ED course: -Hypothermic to 94.62F, heart rate 106, respirations and pulse ox normal, BP 113/66 -Na 135, K 3.4, Cr 3.94, WBC 20.1K, Hgb 11, LFT normal -Lipase 71 -Troponin negative -CT abdomen and pelvis without contrast showed no SBO, no renal stone, no pancreatitis, but showed persistent colitis -Pain improved with fentanyl in ER -He was given cipro-flagyl and TRH were asked to evaluate for hypothermia and colitis      ROS: Review of Systems  Constitutional: Positive for chills.  Negative for diaphoresis, fever and malaise/fatigue.  Gastrointestinal: Positive for abdominal pain and diarrhea (chronic). Negative for blood in stool, melena, nausea and vomiting.  Neurological: Negative for weakness.  All other systems reviewed and are negative.         Past Medical History:  Diagnosis Date  . AIDS ([place]) 11/22/2014  . Chronic diarrhea   . Chronic hepatitis C without hepatic coma ([place]) 11/22/2014  . Diabetic neuropathy ([place])   . ESRD (end stage renal disease) on dialysis [facility])    "TTS; don't remember street name" (05/03/2014)  . Hepatitis C   . HIV INFECTION 06/27/2010   Qualifier: Diagnosis of  By: Nickola Major CMA ( [place]), Geni Bers    . Hypotension 06/02/2012  . Metabolic bone disease 05/18/3844  . MRSA infection   . Normocytic anemia 06/17/2012  . Pancreatitis   . Pressure ulcer of BKA stump, stage 2 ([place]) 11/22/2014  . Renal disorder   . Severe protein-calorie malnutrition ([place]) 06/17/2012  . Uncontrolled diabetes mellitus with complications ([place]) 6/59/9357   Annotation: uncontrolled Qualifier: Diagnosis of  By: Nickola Major CMA Deborra Medina), Geni Bers      Past Surgical History:  Procedure Laterality Date  . AMPUTATION Left 04/20/2014   Procedure: 3rd toe amputation, 4th Toe Amputation,  5th Toe Amputation;  Surgeon: Newt Minion, MD;  Location: [place];  Service: Orthopedics;  Laterality: Left;  . AMPUTATION Left 05/02/2014   Procedure: Midfoot Amputation;  Surgeon: Newt Minion, MD;  Location: [place];  Service: Orthopedics;  Laterality:  Left;  . AMPUTATION Left 06/17/2014   Procedure: AMPUTATION BELOW KNEE;  Surgeon: Newt Minion, MD;  Location: [facility];  Service: Orthopedics;  Laterality: Left;  . AV FISTULA PLACEMENT Left   . FOOT AMPUTATION THROUGH ANKLE Left 12/'21/2015   midfoot    Social History: Patient lives with his brother.  He does not smoke.    No Known Allergies  Family history: family history includes Diabetes in his father and mother.  Prior to  Admission medications   Medication Sig Start Date End Date Taking? Authorizing Provider  HYDROcodone-acetaminophen (NORCO) 5-325 MG tablet Take 1 tablet by mouth every 6 (six) hours as needed. Patient taking differently: Take 1 tablet by mouth every 6 (six) hours as needed for moderate pain.  01/22/16  Yes Tatyana Kirichenko, PA-C  lamivudine (EPIVIR) 100 MG tablet Take 0.5 tablets (50 mg total) by mouth daily. Patient taking differently: Take 100 mg by mouth daily.  03/06/16  Yes Marijean Heath, NP  omeprazole (PRILOSEC) 40 MG capsule TAKE 1 CAPSULE BY MOUTH EVERY DAY 09/14/15  Yes Campbell Riches, MD  sulfamethoxazole-trimethoprim (BACTRIM DS,SEPTRA DS) 800-160 MG tablet Take 1 tablet by mouth daily.   Yes Historical Provider, MD  tenofovir (VIREAD) 300 MG tablet TAKE 1 TABLET BY MOUTH ONCE A WEEK ON SUNDAY 02/23/16  Yes Truman Hayward, MD  TIVICAY 50 MG tablet TAKE 1 TABLET BY MOUTH EVERY DAY 02/23/16  Yes Truman Hayward, MD  amoxicillin-clavulanate (AUGMENTIN) 500-125 MG tablet Take 1 tablet (500 mg total) by mouth daily. Patient not taking: Reported on 03/16/2016 03/07/16   Marijean Heath, NP  glipiZIDE (GLUCOTROL) 5 MG tablet Take 2.5 mg by mouth 2 (two) times daily. 03/26/16   Historical Provider, MD  lanthanum (FOSRENOL) 500 MG chewable tablet Chew 1 tablet (500 mg total) by mouth 3 (three) times daily with meals. Patient not taking: Reported on 03/16/2016 03/07/16   Marijean Heath, NP  methocarbamol (ROBAXIN) 500 MG tablet Take 1 tablet (500 mg total) by mouth every 6 (six) hours as needed for muscle spasms. Patient not taking: Reported on 03/16/2016 04/22/14   Vivi Barrack, MD  midodrine (PROAMATINE) 10 MG tablet Take 1 tablet (10 mg total) by mouth 3 (three) times daily with meals. Patient not taking: Reported on 03/16/2016 03/11/16   Carlyle Basques, MD  multivitamin (RENA-VIT) TABS tablet Take 1 tablet by mouth at bedtime. Patient not taking: Reported on  03/16/2016 03/06/16   Marijean Heath, NP       Physical Exam: BP 102/66 (BP Location: Right Arm)   Pulse 105   Temp 98.5 F (36.9 C) (Oral)   Resp 15   SpO2 96%  General appearance: Adult male, alert and in mild discomfort, towel wrapped around his head, lights off, heat packs in arm pits and groin.   Eyes: Anicteric, conjunctiva pink, lids and lashes normal. PERRL.    ENT: No nasal deformity, discharge, epistaxis.  Hearing normal. OP moist without lesions.   Neck: No neck masses.  Trachea midline.  No thyromegaly/tenderness. Lymph: No cervical or supraclavicular lymphadenopathy. Skin: Warm and dry.  No jaundice.  No suspicious rashes or lesions. Cardiac: RRR, nl S1-S2, no murmurs appreciated.  Capillary refill is brisk.   Respiratory: Normal respiratory rate and rhythm.  CTAB without rales or wheezes. Abdomen: Abdomen soft.  Mild nonfocal TTP without guarding, rebound or rigidity. No ascites, distension, hepatosplenomegaly.   MSK: No deformities or effusions.  No cyanosis or clubbing.  Neuro: Cranial nerves normal.  Speech is fluent in [place].  Muscle strength normal.    Psych: Sensorium intact and responding to questions, attention normal.  Behavior appropriate.  Affect normal.  Judgment and insight appear normal.     Labs on Admission:  I have personally reviewed following labs and imaging studies: CBC:  Recent Labs Lab 04/01/16 2336  WBC 20.1*  NEUTROABS 18.3*  HGB 11.0*  HCT 33.2*  MCV 98.5  PLT 373   Basic Metabolic Panel:  Recent Labs Lab 04/01/16 2336  NA 135  K 3.4*  CL 97*  CO2 26  GLUCOSE 71  BUN 9  CREATININE 3.94*  CALCIUM 8.4*   GFR: CrCl cannot be calculated (Unknown ideal weight.).  Liver Function Tests:  Recent Labs Lab 04/01/16 2336  AST 32  ALT 10*  ALKPHOS 78  BILITOT 0.5  PROT 7.7  ALBUMIN 2.1*    Recent Labs Lab 04/01/16 2336  LIPASE 71*   No results for input(s): AMMONIA in the last 168 hours. Coagulation  Profile: No results for input(s): INR, PROTIME in the last 168 hours. Cardiac Enzymes: No results for input(s): CKTOTAL, CKMB, CKMBINDEX, TROPONINI in the last 168 hours. BNP (last 3 results) No results for input(s): PROBNP in the last 8760 hours. HbA1C: No results for input(s): HGBA1C in the last 72 hours. CBG: No results for input(s): GLUCAP in the last 168 hours. Lipid Profile: No results for input(s): CHOL, HDL, LDLCALC, TRIG, CHOLHDL, LDLDIRECT in the last 72 hours. Thyroid Function Tests: No results for input(s): TSH, T4TOTAL, FREET4, T3FREE, THYROIDAB in the last 72 hours. Anemia Panel: No results for input(s): VITAMINB12, FOLATE, FERRITIN, TIBC, IRON, RETICCTPCT in the last 72 hours. Sepsis Labs: Invalid input(s): PROCALCITONIN, LACTICIDVEN No results found for this or any previous visit (from the past 240 hour(s)).       Radiological Exams on Admission: Personally reviewed CT report: Ct Renal Stone Study  Result Date: 04/02/2016 CLINICAL DATA:  Abdominal pain for 1 day after dialysis. Nausea and vomiting. Chronic diarrhea. White cell count 20.1. Elevated lipase of 71. History of AIDS, hepatitis-C, end-stage renal disease, MR SA, and pancreatitis. EXAM: CT ABDOMEN AND PELVIS WITHOUT CONTRAST TECHNIQUE: Multidetector CT imaging of the abdomen and pelvis was performed following the standard protocol without IV contrast. COMPARISON:  03/04/2016 FINDINGS: Lower chest: Motion artifact.  Lung bases are grossly clear. Hepatobiliary: No focal liver abnormality is seen. No gallstones, gallbladder wall thickening, or biliary dilatation. Pancreas: Unremarkable. No pancreatic ductal dilatation or surrounding inflammatory changes. Spleen: Normal in size without focal abnormality. Adrenals/Urinary Tract: Calcifications demonstrated throughout both kidneys at the corticomedullary junctions possibly representing stones or medullary calcinosis. Similar appearance to previous study. Bladder is  decompressed. No adrenal gland nodules. Stomach/Bowel: Stomach is grossly unremarkable. Small bowel and colon are mostly decompressed. Colon and terminal ileum demonstrates diffuse wall thickening suggesting inflammatory process. This could be due to infectious or inflammatory colitis. Similar changes were present previously. Vascular/Lymphatic: Scattered calcifications in the aorta. No aortic aneurysm. No significant lymphadenopathy in the retroperitoneum. Reproductive: Prostate is unremarkable. Other: No free air or free fluid in the abdomen. Mild mesenteric edema of nonspecific etiology. Abdominal wall musculature appears intact. Musculoskeletal: No acute or significant osseous findings. Examination is technically limited due to motion artifact. IMPRESSION: Multiple bilateral intrarenal calcifications without evidence of obstruction. Diffuse wall thickening of the colon and terminal ileum suggesting infectious or inflammatory bowel disease. No evidence of obstruction. Electronically Signed   By: Oren Beckmann.D.  On: 04/02/2016 00:58        Assessment/Plan  1. Hypothermia:  Exposure vs sepsis syndrome.  Described by EMS to be in unheated apartment.  Re-warmed in ED.  Currently normal temperature and does not appear septic.  -Check GI pathogen panel -Enteric precautions -Continue cipro-flagyl for now, low threshold to discontinue if pathogen panel negative -If >6 stools in 24 hours, send for C diff    2. Colitis, chronic:  Colitis treated by ID with vanc/Zosyn to Augmentin in October, now presents with same abdominal pain, again colitis on CT.  Causes of chronic colitis in this HIV infected patient presumably include fungal or viral colitides as well as atypical presentations of bacterial colitis.  Inflammatory bowel disease is lower on the differential.  Pancreatitis is doubted clinically and radiographically. -Check GI panel -Check stool Ova/parasites -Continue ART  3. ESRD on HD  MWF: -Continue Myriam Forehand -If still in hospital Wednesday, will need Renal eval for possible HD on Weds or Thurs at [facility]  4. NIDDM:  -Hold glipizide while in hospital -Low dose SSI with meals  5. Anemia of renal disease:  Stable.  6. HIV:  -Continue lamivudine, Viread, Tivicay -Hold Bactrim per ID notes last admission  7. Chronic hypotension:  Hypotensive in ER, but has not taken midodrine tonight and clinically appears well. -Continue midodrine     DVT prophylaxis: heparin  Code Status: FULL  Family Communication: Friends at bedside  Disposition Plan: Anticipate to monitor temperature overnight and screen GI pathogen panel.  If pain still resolved tomorrow and GI panel negative, home with GI outpatient follow up. Consults called: None overnight Admission status: OBS At the point of initial evaluation, it is my clinical opinion that admission for OBSERVATION is reasonable and necessary because the patient's presenting complaints in the context of their chronic conditions represent sufficient risk of deterioration or significant morbidity to constitute reasonable grounds for close observation in the hospital setting, but that the patient may be medically stable for discharge from the hospital within 24 to 48 hours.    Medical decision making: Patient seen at 1:30 AM on 04/02/2016.  The patient was discussed with Dr. Randal Buba.  What exists of the patient's chart was reviewed in depth and ouside records in [facility] and summarized above.  Clinical condition: stable.        Edwin Dada [facility] Pager [phone]
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
## 2016-04-02 NOTE — ED Notes (Signed)
Pt was wrap in warm blankets and hot packs place underarms

## 2016-04-02 NOTE — Progress Notes (Signed)
Admission note:  Arrival Method: Patient arrived via [facility] from [facility]. Mental Orientation:  Alert and oriented x 4. Speaks Spanish. 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  Dressing noted on the right foot which was changed in the afternoon at [facility] long by [facility] nurse per [place]. IV: Right forearm saline lock. Pain: Denies any pain currently. Tubes: N/A Safety Measures: Bed in low position, call bell and phone within reach.  Will continue to monitor.  Fall Prevention Safety Plan: reviewed the plan, pt understood and acknowledged. Admission Screening: In progress. 6700 Orientation: Patient has been oriented to the unit, staff and to the room.
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
## 2016-04-02 NOTE — ED Notes (Signed)
Provided ice chips with permission from Dr. Curlene Labrum.

## 2016-04-02 NOTE — Progress Notes (Signed)
Called re: low BP.  Patient clinically unchanged, ate sandwich, but measured BP 60s/30s.  Recommended midodrine for chronic hypotension and repeat BP in 1 hour.  On re-eval now, patient found sleeping, easily rousable.  Says he is feeling better, oriented to place and situation.  Repeat BP 95/55, HR 93.  Patient appears comfortable, respirations normal.    Follow GI pathogen panel per plan.  The patient is noted to have a MAP's <65/ SBP's <90. With the current information available to me, I don't think the patient is in septic shock. Lactate is normal and the MAP's <65/ SBP's <90, is related to chronic inter-dialytic hypotension.  Continue midodrine for chronic hypotension.

## 2016-04-02 NOTE — Progress Notes (Signed)
Patient complained of pressure/pain behind both eyes and trouble seeing that began yesterday. RN was just now made aware. MD paged to make aware. Will continue to monitor.   Callie Fielding RN

## 2016-04-02 NOTE — Progress Notes (Signed)
Report called to [place] at [place]. Hospital course and plan of care reviewed. All questions answered. [facility] called for transport.  Callie Fielding RN

## 2016-04-02 NOTE — ED Notes (Signed)
Patient in radiology

## 2016-04-02 NOTE — ED Notes (Signed)
Will give bedside report.  

## 2016-04-02 NOTE — Progress Notes (Signed)
Pharmacy Antibiotic Note  Alvin Daniels is a 45 y.o. male with hx of HIV, hepatitis C, NIDDM and ESRD on HD M/W/F admitted on 04/01/2016 with intra-abdominal infecion.  Pharmacy has been consulted for cipro dosing.  Plan: Cipro 400mg  IV q24h   Height: 5\' 4"  (162.6 cm) Weight: 104 lb 8 oz (47.4 kg) IBW/kg (Calculated) : 59.2  Temp (24hrs), Avg:97.3 F (36.3 C), Min:94.6 F (34.8 C), Max:98.9 F (37.2 C)   Recent Labs Lab 04/01/16 2336 04/02/16 0208  WBC 20.1*  --   CREATININE 3.94*  --   LATICACIDVEN  --  0.9    Estimated Creatinine Clearance: 15.9 mL/min (by C-G formula based on SCr of 3.94 mg/dL (H)).    No Known Allergies  Antimicrobials this admission: 11/21 cipro >>  11/21 flagyl >>   Dose adjustments this admission:   Microbiology results:  BCx:   UCx:    Sputum:   MRSA PCR:   Thank you for allowing pharmacy to be a part of this patient's care.  Dorrene German 04/02/2016 3:17 AM

## 2016-04-02 NOTE — Progress Notes (Signed)
PHARMACY NOTE -  ciprofloxacin  Pharmacy has been assisting with dosing of ciprofloxacin  for IAI. Patient has ESRD on HD. Dosage remains stable at 400 mg IV daily and need for further dosage adjustment appears unlikely at present.    Will sign off at this time.  Please reconsult if a change in clinical status warrants re-evaluation of dosage.  Dia Sitter, PharmD, BCPS 04/02/2016 11:14 AM

## 2016-04-03 ENCOUNTER — Ambulatory Visit (INDEPENDENT_AMBULATORY_CARE_PROVIDER_SITE_OTHER): Payer: Self-pay | Admitting: Orthopedic Surgery

## 2016-04-03 LAB — BASIC METABOLIC PANEL
ANION GAP: 13 (ref 5–15)
BUN: 15 mg/dL (ref 6–20)
CHLORIDE: 96 mmol/L — AB (ref 101–111)
CO2: 26 mmol/L (ref 22–32)
Calcium: 8.5 mg/dL — ABNORMAL LOW (ref 8.9–10.3)
Creatinine, Ser: 7.13 mg/dL — ABNORMAL HIGH (ref 0.61–1.24)
GFR calc Af Amer: 10 mL/min — ABNORMAL LOW (ref >60)
GFR calc non Af Amer: 8 mL/min — ABNORMAL LOW (ref >60)
GLUCOSE: 122 mg/dL — AB (ref 65–99)
POTASSIUM: 4.1 mmol/L (ref 3.5–5.1)
Sodium: 135 mmol/L (ref 135–145)

## 2016-04-03 LAB — GASTROINTESTINAL PANEL BY PCR, STOOL (REPLACES STOOL CULTURE)
ADENOVIRUS F40/41: NOT DETECTED
ASTROVIRUS: NOT DETECTED
CAMPYLOBACTER SPECIES: NOT DETECTED
CYCLOSPORA CAYETANENSIS: NOT DETECTED
Cryptosporidium: NOT DETECTED
ENTAMOEBA HISTOLYTICA: NOT DETECTED
ENTEROPATHOGENIC E COLI (EPEC): NOT DETECTED
ENTEROTOXIGENIC E COLI (ETEC): NOT DETECTED
Enteroaggregative E coli (EAEC): NOT DETECTED
Giardia lamblia: NOT DETECTED
NOROVIRUS GI/GII: NOT DETECTED
Plesimonas shigelloides: NOT DETECTED
Rotavirus A: NOT DETECTED
SAPOVIRUS (I, II, IV, AND V): NOT DETECTED
SHIGA LIKE TOXIN PRODUCING E COLI (STEC): NOT DETECTED
Salmonella species: NOT DETECTED
Shigella/Enteroinvasive E coli (EIEC): NOT DETECTED
VIBRIO CHOLERAE: NOT DETECTED
VIBRIO SPECIES: NOT DETECTED
Yersinia enterocolitica: NOT DETECTED

## 2016-04-03 LAB — CBC
HEMATOCRIT: 31.7 % — AB (ref 39.0–52.0)
HEMOGLOBIN: 10.3 g/dL — AB (ref 13.0–17.0)
MCH: 32.3 pg (ref 26.0–34.0)
MCHC: 32.5 g/dL (ref 30.0–36.0)
MCV: 99.4 fL (ref 78.0–100.0)
Platelets: 418 10*3/uL — ABNORMAL HIGH (ref 150–400)
RBC: 3.19 MIL/uL — ABNORMAL LOW (ref 4.22–5.81)
RDW: 14.9 % (ref 11.5–15.5)
WBC: 19.4 10*3/uL — ABNORMAL HIGH (ref 4.0–10.5)

## 2016-04-03 LAB — GLUCOSE, CAPILLARY
GLUCOSE-CAPILLARY: 140 mg/dL — AB (ref 65–99)
Glucose-Capillary: 115 mg/dL — ABNORMAL HIGH (ref 65–99)
Glucose-Capillary: 90 mg/dL (ref 65–99)
Glucose-Capillary: 110 mg/dL — ABNORMAL HIGH (ref 65–99)

## 2016-04-03 LAB — PHOSPHORUS: PHOSPHORUS: 3.7 mg/dL (ref 2.5–4.6)

## 2016-04-03 MED ORDER — SODIUM CHLORIDE 0.9 % IV SOLN: 100.0000 mL | INTRAVENOUS | Status: DC | PRN

## 2016-04-03 MED ORDER — MIDODRINE HCL 5 MG PO TABS
ORAL_TABLET | ORAL | Status: AC
  Filled 2016-04-03: qty 2

## 2016-04-03 MED ORDER — DARBEPOETIN ALFA 25 MCG/0.42ML IJ SOSY: 25.0000 ug | PREFILLED_SYRINGE | INTRAMUSCULAR | Status: DC

## 2016-04-03 MED ORDER — DOXERCALCIFEROL 4 MCG/2ML IV SOLN
INTRAVENOUS | Status: AC
  Filled 2016-04-03: qty 4

## 2016-04-03 MED ORDER — LIDOCAINE HCL (PF) 1 % IJ SOLN: 5.0000 mL | INTRAMUSCULAR | Status: DC | PRN

## 2016-04-03 MED ORDER — DARBEPOETIN ALFA 25 MCG/0.42ML IJ SOSY
25.0000 ug | PREFILLED_SYRINGE | Freq: Once | INTRAMUSCULAR | Status: DC
  Filled 2016-04-03: qty 0.42

## 2016-04-03 MED ORDER — DOXERCALCIFEROL 4 MCG/2ML IV SOLN
6.0000 ug | Freq: Once | INTRAVENOUS | Status: DC
  Filled 2016-04-03: qty 4

## 2016-04-03 MED ORDER — LIDOCAINE-PRILOCAINE 2.5-2.5 % EX CREA: 1.0000 "application " | TOPICAL_CREAM | CUTANEOUS | Status: DC | PRN

## 2016-04-03 MED ORDER — ACETAMINOPHEN 325 MG PO TABS
ORAL_TABLET | ORAL | Status: AC
  Filled 2016-04-03: qty 2

## 2016-04-03 MED ORDER — HEPARIN SODIUM (PORCINE) 1000 UNIT/ML DIALYSIS: 20.0000 [IU]/kg | INTRAMUSCULAR | Status: DC | PRN

## 2016-04-03 MED ORDER — DARBEPOETIN ALFA 25 MCG/0.42ML IJ SOSY
PREFILLED_SYRINGE | INTRAMUSCULAR | Status: AC
  Filled 2016-04-03: qty 0.42

## 2016-04-03 MED ORDER — HEPARIN SODIUM (PORCINE) 1000 UNIT/ML DIALYSIS: 1000.0000 [IU] | INTRAMUSCULAR | Status: DC | PRN

## 2016-04-03 MED ORDER — DARBEPOETIN ALFA 25 MCG/0.42ML IJ SOSY
25.0000 ug | PREFILLED_SYRINGE | INTRAMUSCULAR | Status: AC
  Filled 2016-04-03: qty 0.42

## 2016-04-03 MED ORDER — DOXERCALCIFEROL 4 MCG/2ML IV SOLN: 6.0000 ug | INTRAVENOUS | Status: DC

## 2016-04-03 MED ORDER — ACETAMINOPHEN 325 MG PO TABS
650.0000 mg | ORAL_TABLET | Freq: Once | ORAL | Status: AC
  Administered 2016-04-03: 650 mg via ORAL

## 2016-04-03 MED ORDER — DOXERCALCIFEROL 4 MCG/2ML IV SOLN
6.0000 ug | INTRAVENOUS | Status: AC
  Administered 2016-04-03: 6 ug via INTRAVENOUS
  Filled 2016-04-03: qty 4

## 2016-04-03 MED ORDER — PENTAFLUOROPROP-TETRAFLUOROETH EX AERO
1.0000 | INHALATION_SPRAY | CUTANEOUS | Status: DC | PRN
Start: 2016-04-03 — End: 2016-04-03
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
## 2016-04-03 NOTE — Progress Notes (Signed)
   04/03/16 0900  Clinical Encounter Type  Visited With Patient  Visit Type Initial;Spiritual support  Referral From Physician  Consult/Referral To Chaplain  Spiritual Encounters  Spiritual Needs Emotional  Stress Factors  Patient Stress Factors Health changes    Chaplain responded to [place] consult. Spent approximately 15 minutes with pt listening to narrative of life story and that pt is lonely and depressed. Will attempt to revisit pt if still in hospital this weekend.
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
## 2016-04-03 NOTE — Consult Note (Signed)
Subjective:   HPI  The patient is a 45 year old male with a history of HIV, hepatitis C which has been treated in the past and end-stage renal disease who has been on hemodialysis for the past 6-1/2 years. The patient tells me that he has a cousin in [place] who is willing to give him a kidney transplant and he wanted to know how he could arrange for that. I told him to discuss this with his nephrologist.  He came in with complaints of abdominal pain in the lower abdomen. In October he had abdominal pain as well and had a CT scan showing colitis. He had a negative GI pathogens panel as well as a negative C. difficile at that time. It was recommended to him to have an outpatient colonoscopy. He has had a repeat CT scan which again shows evidence of colitis. He denies diarrhea or rectal bleeding. He has a little abdominal discomfort at this time but not much. He is feeling much better.  Review of Systems Denies chest pain or shortness of breath  Past Medical History:  Diagnosis Date  . AIDS ([place]) 11/22/2014  . Chronic diarrhea   . Chronic hepatitis C without hepatic coma ([place]) 11/22/2014  . Diabetic neuropathy ([place])   . ESRD (end stage renal disease) on dialysis [facility])    "TTS; don't remember street name" (05/03/2014)  . Hepatitis C   . HIV INFECTION 06/27/2010   Qualifier: Diagnosis of  By: Nickola Major CMA ( [place]), Geni Bers    . Hypotension 06/02/2012  . Metabolic bone disease 05/16/4816  . MRSA infection   . Normocytic anemia 06/17/2012  . Pancreatitis   . Pressure ulcer of BKA stump, stage 2 ([place]) 11/22/2014  . Renal disorder   . Severe protein-calorie malnutrition ([place]) 06/17/2012  . Uncontrolled diabetes mellitus with complications ([place]) 5/63/1497   Annotation: uncontrolled Qualifier: Diagnosis of  By: Nickola Major CMA Deborra Medina), Geni Bers     Past Surgical History:  Procedure Laterality Date  . AMPUTATION Left 04/20/2014   Procedure: 3rd toe amputation, 4th Toe Amputation,  5th Toe Amputation;   Surgeon: Newt Minion, MD;  Location: [place];  Service: Orthopedics;  Laterality: Left;  . AMPUTATION Left 05/02/2014   Procedure: Midfoot Amputation;  Surgeon: Newt Minion, MD;  Location: [place];  Service: Orthopedics;  Laterality: Left;  . AMPUTATION Left 06/17/2014   Procedure: AMPUTATION BELOW KNEE;  Surgeon: Newt Minion, MD;  Location: [place];  Service: Orthopedics;  Laterality: Left;  . AV FISTULA PLACEMENT Left   . FOOT AMPUTATION THROUGH ANKLE Left 12/'21/2015   midfoot   Social History   Social History  . Marital status: Single    Spouse name: N/A  . Number of children: N/A  . Years of education: N/A   Occupational History  . Not on file.   Social History Main Topics  . Smoking status: Never Smoker  . Smokeless tobacco: Never Used  . Alcohol use No  . Drug use: No  . Sexual activity: No   Other Topics Concern  . Not on file   Social History Narrative   ** Merged History Encounter **       family history includes Diabetes in his father and mother.  Current Facility-Administered Medications:  .  ciprofloxacin (CIPRO) IVPB 400 mg, 400 mg, Intravenous, Q24H, Dorrene German, RPH, 400 mg at 04/02/16 2218 .  Darbepoetin Alfa (ARANESP) injection 25 mcg, 25 mcg, Intravenous, Q Wed-HD, Alric Seton, PA-C .  [START ON 04/11/2016]  Darbepoetin Alfa (ARANESP) injection 25 mcg, 25 mcg, Intravenous, Q Thu-HD, Bonnielee Haff, MD .  dolutegravir (TIVICAY) tablet 50 mg, 50 mg, Oral, Daily, Edwin Dada, MD, 50 mg at 04/02/16 1040 .  [START ON 04/06/2016] doxercalciferol (HECTOROL) injection 6 mcg, 6 mcg, Intravenous, Q T,Th,Sa-HD, Alric Seton, PA-C .  doxercalciferol (HECTOROL) injection 6 mcg, 6 mcg, Intravenous, Q Wed-HD, Bonnielee Haff, MD .  feeding supplement (ENSURE ENLIVE) (ENSURE ENLIVE) liquid 237 mL, 237 mL, Oral, BID BM, Edwin Dada, MD .  fentaNYL (SUBLIMAZE) injection 50 mcg, 50 mcg, Intravenous, Q2H PRN, Edwin Dada, MD, 50 mcg at  04/02/16 1340 .  heparin injection 5,000 Units, 5,000 Units, Subcutaneous, Q8H, Edwin Dada, MD, 5,000 Units at 04/03/16 0508 .  insulin aspart (novoLOG) injection 0-5 Units, 0-5 Units, Subcutaneous, QHS, Christopher P Danford, MD .  insulin aspart (novoLOG) injection 0-9 Units, 0-9 Units, Subcutaneous, TID WC, Edwin Dada, MD, 1 Units at 04/03/16 0900 .  lamiVUDine (EPIVIR) 10 MG/ML solution 25 mg, 25 mg, Oral, Daily, Debbe Odea, MD .  lanthanum (FOSRENOL) chewable tablet 500 mg, 500 mg, Oral, TID WC, Edwin Dada, MD, 500 mg at 04/03/16 0900 .  metroNIDAZOLE (FLAGYL) tablet 500 mg, 500 mg, Oral, Q8H, Edwin Dada, MD, 500 mg at 04/03/16 0507 .  midodrine (PROAMATINE) tablet 10 mg, 10 mg, Oral, TID WC, Edwin Dada, MD, 10 mg at 04/03/16 0900 .  ondansetron (ZOFRAN) tablet 4 mg, 4 mg, Oral, Q6H PRN **OR** ondansetron (ZOFRAN) injection 4 mg, 4 mg, Intravenous, Q6H PRN, Edwin Dada, MD .  silver sulfADIAZINE (SILVADENE) 1 % cream, , Topical, Daily, Debbe Odea, MD .  Derrill Memo ON 04/07/2016] tenofovir (VIREAD) tablet 300 mg, 300 mg, Oral, Weekly, Edwin Dada, MD No Known Allergies   Objective:     BP (!) 90/54 (BP Location: Right Arm)   Pulse 98   Temp 97.9 F (36.6 C) (Oral)   Resp 18   Ht 5\' 4"  (1.626 m)   Wt 49 kg (108 lb 0.4 oz)   SpO2 98%   BMI 18.54 kg/m   He is in no acute distress  Nonicteric  Heart regular rhythm no murmurs  Lungs clear  Abdomen: Bowel sounds present, soft, minimal discomfort in the lower abdomen without rebound or guarding  Laboratory No components found for: D1    Assessment:     Multiple medical problems as stated above  Abnormal CT showing evidence of colitis of unclear etiology. These findings also showed up in a CT scan in October.      Plan:     GI pathogens panel has been ordered again. A colonoscopy should eventually be done to further investigate the findings of  colitis which are at this time of uncertain etiology. This could be done as an outpatient. I do not see a reason that he needs to stay in the hospital for this since it appears to be a chronic issue. He can follow-up as an outpatient with GI. We will sign off. Call us if needed.

## 2016-04-03 NOTE — Consult Note (Signed)
WOC consulted upon transfer from [facility] to [facility].    WOC evaluated this patient yesterday while at [facility], see consultation note.  Wound care orders are in the patient's record.  [place] Nurse team will follow along with you for weekly wound assessments.  Please notify me of any acute changes in the wounds or any new areas of concerns [place] MSN, [place], CNS [phone]

## 2016-04-03 NOTE — Procedures (Signed)
Pt seen on HD  Ap 140 Vp 200  BFR 400.  SBP 97.  Will try to pull .5L.  Tolerating HD well so far.

## 2016-04-04 LAB — GLUCOSE, CAPILLARY
GLUCOSE-CAPILLARY: 129 mg/dL — AB (ref 65–99)
Glucose-Capillary: 171 mg/dL — ABNORMAL HIGH (ref 65–99)

## 2016-04-04 LAB — CBC
HEMATOCRIT: 31.3 % — AB (ref 39.0–52.0)
Hemoglobin: 10.1 g/dL — ABNORMAL LOW (ref 13.0–17.0)
MCH: 32.4 pg (ref 26.0–34.0)
MCHC: 32.3 g/dL (ref 30.0–36.0)
MCV: 100.3 fL — ABNORMAL HIGH (ref 78.0–100.0)
PLATELETS: 392 10*3/uL (ref 150–400)
RBC: 3.12 MIL/uL — AB (ref 4.22–5.81)
RDW: 15.1 % (ref 11.5–15.5)
WBC: 16.2 10*3/uL — AB (ref 4.0–10.5)

## 2016-04-04 LAB — HEPATITIS B SURFACE ANTIGEN: Hepatitis B Surface Ag: NEGATIVE

## 2016-04-04 MED ORDER — CIPROFLOXACIN HCL 500 MG PO TABS: 500.0000 mg | ORAL_TABLET | Freq: Every day | ORAL | Status: DC

## 2016-04-04 MED ORDER — SILVER SULFADIAZINE 1 % EX CREA: TOPICAL_CREAM | Freq: Every day | CUTANEOUS | 0 refills | Status: DC

## 2016-04-04 MED ORDER — OXYCODONE HCL 5 MG PO TABS
5.0000 mg | ORAL_TABLET | ORAL | Status: DC | PRN
  Administered 2016-04-04: 5 mg via ORAL
  Filled 2016-04-04: qty 1

## 2016-04-04 MED ORDER — HYDROCODONE-ACETAMINOPHEN 5-325 MG PO TABS: 1.0000 | ORAL_TABLET | Freq: Four times a day (QID) | ORAL | 0 refills | Status: DC | PRN

## 2016-04-04 MED ORDER — CIPROFLOXACIN HCL 500 MG PO TABS: 500.0000 mg | ORAL_TABLET | Freq: Every day | ORAL | 0 refills | Status: DC

## 2016-04-04 MED ORDER — LAMIVUDINE 10 MG/ML PO SOLN: 25.0000 mg | Freq: Every day | ORAL | 0 refills | Status: DC

## 2016-04-04 MED ORDER — METRONIDAZOLE 500 MG PO TABS: 500.0000 mg | ORAL_TABLET | Freq: Three times a day (TID) | ORAL | 0 refills | Status: DC
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
## 2016-04-04 NOTE — Discharge Instructions (Signed)
Colitis (Colitis) La colitis es la inflamacin del colon y puede durar un breve perodo [place]) o prolongarse mucho tiempo (crnica). CAUSAS Esta afeccin puede ser causada por lo siguiente:  Virus.  Bacterias.  Reacciones a medicamentos.  Algunas enfermedades autoinmunitarias, como la enfermedad de Crohn y la colitis ulcerosa. SNTOMAS Los sntomas de esta afeccin incluyen lo siguiente:  Diarrea.  Materia fecal sanguinolenta o alquitranada.  Dolor.  Cristy Hilts.  Vmitos.  Cansancio (fatiga).  Prdida de peso.  Meteorismo.  Aumento repentino del dolor abdominal.  Menos deposiciones que lo habitual. DIAGNSTICO Esta afeccin se diagnostica mediante un anlisis de materia fecal o de sangre. Tambin pueden [employer], como radiografas, una tomografa computarizada (TC) o una colonoscopia. TRATAMIENTO El tratamiento puede incluir lo siguiente:  Orthoptist a los intestinos, es Software engineer, no consumir alimentos ni lquidos durante un tiempo.  Administracin de lquidos por va intravenosa (IV).  Medicamentos para Conservation officer, historic buildings y Building services engineer.  Antibiticos.  Medicamentos con cortisona.  [place]. INSTRUCCIONES PARA EL CUIDADO EN EL HOGAR Comida y bebida   Siga las indicaciones del mdico respecto de las restricciones para las comidas o las bebidas.  Beba suficiente lquido para Consulting civil engineer orina clara o de color amarillo plido.  Trabaje con un nutricionista para determinar cules son los alimentos que Programmer, applications.  Evite los alimentos que reagudizan la afeccin.  Consumir una Pharmacologist. Medicamentos   [employer] de venta libre y los recetados solamente como se lo haya indicado el mdico.  Si le recetaron un antibitico, tmelo como se lo haya indicado el mdico. No deje de tomar los antibiticos aunque comience a sentirse mejor. Instrucciones generales   Concurra a todas las visitas de control como se lo haya  indicado el mdico. Esto es importante. SOLICITE ATENCIN MDICA SI:  Los sntomas no desaparecen.  Presenta nuevos sntomas. SOLICITE ATENCIN MDICA DE INMEDIATO SI:  Tiene fiebre que no desaparece con tratamiento.  Comienza a sentir escalofros.  Se siente muy dbil, se desmaya o se deshidrata.  Ha vomitado repetidas veces.  Siente dolor intenso en el abdomen.  Su materia fecal es sanguinolenta o alquitranada. Esta informacin no tiene Marine scientist el consejo del mdico. Asegrese de hacerle al mdico cualquier pregunta que tenga. Document Released: 04/29/2005 Document Revised: 01/18/2015 Document Reviewed: 08/22/2014 Elsevier Interactive Patient Education  2017 [employer].

## 2016-04-04 NOTE — Progress Notes (Signed)
[facility] notified bp 92/38.Arthor Captain LPN
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
## 2016-04-04 NOTE — Progress Notes (Signed)
Reviewed discharge instructions and medications with patient and patient's family per patient's request; all questions answered. Patient refused translator stating that I could speak with him and his family directly.  After reviewing all discharge instructions, this RN explained the importance of taking medications including antibiotics exactly as prescribed and following up with [facility] as soon as possible.  Family and patient both voiced understanding with teachback. Patient left unit in stable condition via wheelchair.

## 2016-04-08 LAB — O&P RESULT

## 2016-04-08 LAB — OVA + PARASITE EXAM

## 2016-04-10 ENCOUNTER — Encounter: Payer: Self-pay | Admitting: Licensed Clinical Social Worker

## 2016-04-10 ENCOUNTER — Encounter: Payer: Self-pay | Admitting: Family Medicine

## 2016-04-10 ENCOUNTER — Ambulatory Visit: Payer: Self-pay | Attending: Family Medicine | Admitting: Family Medicine

## 2016-04-10 ENCOUNTER — Telehealth: Payer: Self-pay

## 2016-04-10 VITALS — BP 65/30 | HR 78 | Temp 97.9°F

## 2016-04-10 DIAGNOSIS — K529 Noninfective gastroenteritis and colitis, unspecified: Secondary | ICD-10-CM | POA: Insufficient documentation

## 2016-04-10 DIAGNOSIS — E1122 Type 2 diabetes mellitus with diabetic chronic kidney disease: Secondary | ICD-10-CM | POA: Insufficient documentation

## 2016-04-10 DIAGNOSIS — Z79899 Other long term (current) drug therapy: Secondary | ICD-10-CM | POA: Insufficient documentation

## 2016-04-10 DIAGNOSIS — I951 Orthostatic hypotension: Secondary | ICD-10-CM | POA: Insufficient documentation

## 2016-04-10 DIAGNOSIS — T25021D Burn of unspecified degree of right foot, subsequent encounter: Secondary | ICD-10-CM | POA: Insufficient documentation

## 2016-04-10 DIAGNOSIS — Z89512 Acquired absence of left leg below knee: Secondary | ICD-10-CM | POA: Insufficient documentation

## 2016-04-10 DIAGNOSIS — T300 Burn of unspecified body region, unspecified degree: Secondary | ICD-10-CM

## 2016-04-10 DIAGNOSIS — Z992 Dependence on renal dialysis: Secondary | ICD-10-CM | POA: Insufficient documentation

## 2016-04-10 DIAGNOSIS — I120 Hypertensive chronic kidney disease with stage 5 chronic kidney disease or end stage renal disease: Secondary | ICD-10-CM | POA: Insufficient documentation

## 2016-04-10 DIAGNOSIS — K219 Gastro-esophageal reflux disease without esophagitis: Secondary | ICD-10-CM | POA: Insufficient documentation

## 2016-04-10 DIAGNOSIS — N186 End stage renal disease: Secondary | ICD-10-CM | POA: Insufficient documentation

## 2016-04-10 DIAGNOSIS — K859 Acute pancreatitis without necrosis or infection, unspecified: Secondary | ICD-10-CM | POA: Insufficient documentation

## 2016-04-10 DIAGNOSIS — B20 Human immunodeficiency virus [HIV] disease: Secondary | ICD-10-CM | POA: Insufficient documentation

## 2016-04-10 DIAGNOSIS — R42 Dizziness and giddiness: Secondary | ICD-10-CM | POA: Insufficient documentation

## 2016-04-10 LAB — POCT GLYCOSYLATED HEMOGLOBIN (HGB A1C): Hemoglobin A1C: 6

## 2016-04-10 LAB — GLUCOSE, POCT (MANUAL RESULT ENTRY): POC GLUCOSE: 62 mg/dL — AB (ref 70–99)

## 2016-04-10 MED ORDER — OMEPRAZOLE 40 MG PO CPDR: DELAYED_RELEASE_CAPSULE | ORAL | 3 refills | Status: DC

## 2016-04-10 MED ORDER — MIDODRINE HCL 10 MG PO TABS: 10.0000 mg | ORAL_TABLET | Freq: Three times a day (TID) | ORAL | 2 refills | Status: DC
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
## 2016-04-10 NOTE — Telephone Encounter (Signed)
At the request of Raina Mina, RN met with the patient to determine transportation needs to wound clinic. The spanish video interpreter was already in place when this CM arrived in the room. Alvin See, LCSW was also present.   The patient confirmed that he attends dialysis 3 days a week - T/Th/Sat and has [facility] transportation to the appointments. He said that he has medicare yet, EPIC indicates that he does not have any insurance. He said that " [name]" helped him with the transportation but he does not have her contact #. It is also noted in EPIC that he had a nurse make a home visit. He confirmed that he had a nurse visit him only once.   CM to investigate the status of his SC[employer] services and home nurse. CM to also inquire further about his insurance and confirm if he has medicare.

## 2016-04-10 NOTE — Progress Notes (Signed)
"  Needs insulin"

## 2016-04-10 NOTE — BH Specialist Note (Signed)
Session Start time: 5:15 pm   End Time: 5:50 pm Total Time:  35 minutes Type of Service: [place]: Yes.     Interpreter Name & Language: Spanish # [facility] Visits July 2017-June 2018: 1st   SUBJECTIVE: Alvin Daniels is a 45 y.o. male  Pt. was referred by Dr. Jarold Song for:  decreased appetite and Food Insecurity and Transportation. Pt. reports the following symptoms/concerns: decreased appetite  Duration of problem:  Ongoing Severity: mild Previous treatment: None reported   OBJECTIVE: Mood: Anxious & Affect: Appropriate Risk of harm to self or others: Pt denied SI/HI Assessments administered: PHQ-9; GAD-7  LIFE CONTEXT:  Family & Social: Pt's support system consists of cousin and family friend, Alvin Daniels who accompanied pt during visit School/ Work: Pt is unemployed. Pt states that he has Medicaid, does not receive any public assistance Self-Care: Pt reports decreased appetite. No report of substance use Life changes: Pt has been hospitalized this month. Pt receives dialysis three times a week and has decreased appetite What is important to pt/family (values): Family, Friendships, Spirituality   GOALS ADDRESSED:  Provide resources for food instability Provide resources for transportation  INTERVENTIONS: Strength-based and Supportive   ASSESSMENT:  Pt is currently experiencing decreased appetite, food insecurity, and difficulty obtaining transportation to medical appointments. Pt has limited support that consists of a cousin and family friend, Alvin Daniels, who accompanied pt during visit. Pt reported that [place] visits him daily, prepares meals, and has assisted with dressing wounds. Pt copes with stressors by talking with loved ones and praying. Pt would like home health aid and transportation assistance to medical appointments. LCSWA and RN CM Alvin Daniels discussed transportation options with pt, who disclosed he currently utilizes [facility]  for dialysis appointments. Pt was provided canned goods and Glucerna to assist with food insecurity.      PLAN: 1. F/U with behavioral health clinician: Pt was encouraged to contact [facility] with any mental health and/or community resource assistance. 2. Behavioral Health meds: None reported 3. Behavioral recommendations: LCSWA recommends that pt apply healthy coping skills discussed. Pt is encouraged to schedule follow up appointment with LCSWA, if needed 4. Referral: Alvin Daniels, Problem-solving teaching/coping strategies and Supportive Counseling 5. From scale of 1-10, how likely are you to follow plan: [place], MSW, [place] Worker 04/11/16 6:27 pm  Warmhandoff:   Warm Hand Off Completed.

## 2016-04-11 ENCOUNTER — Telehealth: Payer: Self-pay

## 2016-04-11 ENCOUNTER — Telehealth: Payer: Self-pay | Admitting: *Deleted

## 2016-04-11 ENCOUNTER — Encounter: Payer: Self-pay | Admitting: Family Medicine

## 2016-04-11 NOTE — Telephone Encounter (Signed)
RN received a call from [place]/[facility] stating she received a call from Janett Billow Beck/RN who would like to discuss Mr. Cottingham.   RN contacted Janett Billow who stated Mr. Rafuse arrived at there clinic for his appt with a dirty rag on his wound. Janett Billow was concerned that Mr. Theiss needs wound care and transportation.  RN explained to Janett Billow my role at [facility] and updated Janett Billow with the information that I have. Mr Wigington and Benjamine Mola has both bee educated and demonstrated a ability to do the daily wound care. I have instructed them on the wound care needs along with [facility] who stated prior to discharge they have educated him on the wound care. Both times the education was done with the assistance of a interpreter.   A lot of assistance is not available for Mr Fei because of his immigration status. His insurance only covers Dialysis. He does not qualify for Home Health or a rehab facility.  Janett Billow and I discussed getting Mr Qu linked with [facility] since transportation is a issue for Mr. Anaya. Janett Billow stated she will be speaking with her provider tomorrow to see if Mr Gonnella can be seen locally at [facility]. RN informed Janett Billow that he has been seen at the [facility] previously for a left stump wound.

## 2016-04-11 NOTE — Telephone Encounter (Signed)
This Case Manager spoke with Dr. Jarold Song who indicated patient needs to follow-up with Wound Care at [facility]. She indicated patient needing transportation resources to get to appointment. This Case Manager placed call to [facility] [phone]) to determine if patient has insurance coverage. Was informed Louretta Shorten, SW not at facility, but spoke with Janace Hoard, RN who indicated patient has dialysis only Medicaid. Angie confirmed patient typically utilized [facility] to get to dialysis; however, [facility] does not provide transportation services out of [facility]. She indicated patient's relative Riddick Nuon [phone]) may be able to provide transportation for patient to appointment.   This Case Manager placed calls x3 to patient with assist of  [place] [phone] # [phone]). Unable to reach patient; HIPPA compliant voicemail left requesting return call. Call also placed to Mauricio Po, emergency contact [phone]) to determine if there was an alternate number to reach patient. Unable to reach; HIPPA compliant voicemail left requesting return call.  Placed call to General Surgery at [facility] [phone]) to schedule appointment. Spoke with Colletta [place] who indicated patient had an appointment on 04/09/16 with [facility] Clinic but was a no-show. Appointment rescheduled for 04/16/16 at 1030. Address to facility is: [address], [place], [place]).  Inquired if [facility]ina [facility] has any available transportation services for patients and was informed they do not have transportation services for patients.  Call placed to [facility] to determine if they knew any out of county transportation services. Spoke with representative with scheduling line who confirmed [facility] does not provide transportation for patients outside of [facility]. No known transportation resources for patients outside of county.  This Case Manager placed call to Laureen Abrahams, [place] Nurse [phone]) to determine if she provides home wound care to patient. Amber indicated her job is to stress medication adherence with patients.  However, she received a call from [facility] prior to patient's discharge saying they could not set up home health for patient due to his lack of insurance, and they requested she see him once to provide wound care. She said she saw patient and taught patient and a friend, Benjamine Mola, how to do wound care. She stated Benjamine Mola told her that she is with patient Monday-Friday and would be able to assist with dressing changes on those days. Amber indicated she did not have [employer]  number. Amber uncertain of patient's compliance with wound care and diabetes management.  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 Will route encounter to Dr. Jarold Song so she is aware of status of patient getting transportation to appointment. Awaiting return call from patient.

## 2016-04-12 ENCOUNTER — Telehealth: Payer: Self-pay

## 2016-04-12 NOTE — Telephone Encounter (Addendum)
This Case Manager placed call to patient with aide of [employer] (Interpreter [phone]) to inform him of appointment at [facility] on 04/16/16 at 1030.  Patient indicated he had dialysis at the time of the appointment and requested appointment to be moved to a later time that day or to a Wednesday. Patient indicated he is agreeable to his emergency contact, Claudia Desanctis being updated on appointments.  This Case Manager placed call to [facility] to see if appointment could be moved another day or to a later time on 04/16/16. Was informed that providers only see patients in Burn Clinic on Monday and Tuesday and there were no available appointments for two weeks. Was suggested that patient's dialysis shift be moved to another time if possible.   This Case Manager placed call to [facility] [phone]) and spoke with Louretta Shorten, SW at facility. Explained importance of patient making it to appointment on 04/16/16 at [facility], and she indicated she would discuss with RN at facility to see if dialysis session on 04/16/16 could be moved to another time. She also indicated she would have dialysis RN stress importance of patient not missing appointment at his upcoming dialysis session on 04/13/16. Appointment time and address of facility faxed to Louretta Shorten so patient can be given information at upcoming dialysis session [phone]). Claudia Desanctis has been informed of appointment at [facility], and he indicated he would make calls to work on arranging transportation for patient to get to appointment.  Addendum-Addendum-Spoke once again to [place], Zena at [facility] [phone]) who indicated patient's Tuesday dialysis has been moved to Monday. She indicated facility still working on time of session, but his HD nurse will inform him of time on Saturday. She also indicated she would set-up his [facility] transportation to his HD on Monday. She also indicated his HD  nurse would stress importance of patient going to appointment on 04/16/16 at [facility] and give patient the address of [facility].

## 2016-04-15 ENCOUNTER — Telehealth: Payer: Self-pay

## 2016-04-15 NOTE — Telephone Encounter (Signed)
Call placed to [facility] # [phone], spoke to [place]. She confirmed that the patient had dialysis today and is going to his appointment at [facility] tomorrow. She noted that dialysis has arranged for transportation for him to the appointment.  She stated that his medicaid only covers his dialysis, nothing more. He does not receive any income and is not eligible for medication coverage, PCS or transportation. She noted that [facility] provides his HIV medications. She stated that she has contacted [employer] to inquire about resources for the patient and they were not able to provide any information/assistance for him.   Update provided to Dr Jarold Song

## 2016-04-16 DIAGNOSIS — T25321D Burn of third degree of right foot, subsequent encounter: Secondary | ICD-10-CM | POA: Insufficient documentation

## 2016-04-18 ENCOUNTER — Inpatient Hospital Stay: Payer: No Typology Code available for payment source | Admitting: Infectious Disease

## 2016-04-18 ENCOUNTER — Telehealth: Payer: Self-pay

## 2016-04-18 NOTE — Progress Notes (Signed)
Rn received a call from [facility] team member) stating the patient has been discharged with a wound and it is not clear if the patient has a CG. The team asked if I would be willing to check on the patient. RN traveled to the patient's home. RN assessed the patient's current situation and it appears unclear as to if he was taught wound care and if he has a CG to assist with care. RN will need to speak with [facility] to see what has been taught to the patient and how I can best assist the patient

## 2016-04-18 NOTE — Patient Instructions (Signed)
Plan is to return tomorrow after attempting to coordinate care for the patient

## 2016-04-18 NOTE — Telephone Encounter (Signed)
Dr. Jarold Song inquired if patient had follow-up appointment at [facility]. Call placed to [facility] and spoke with Randall Hiss who indicated patient did go to appointment on 04/16/16 and was admitted to hospital. Dr. Jarold Song updated.

## 2016-04-19 NOTE — Progress Notes (Signed)
To recap on yesterday's visit:RN received a call from [facility] team member) stating the patient has been discharged with a wound and it is not clear if the patient has a CG. The team asked if I would be willing to check on the patient. RN traveled to the patient's home. RN assessed the patient's current situation and it appears unclear as to if he was taught wound care and if he has a CG to assist with care. RN will need to speak with [facility] to see what has been taught to the patient and how I can best assist the patient  RN contacted [facility] and they we able to confirm that educations and demonstrations of wound care was performed prior to the patient's discharge from their hospital. RN traveled to the home today and was able to meet a CG(Eizabeth) who stated(through [employer]) that she has been completing the wound care daily as instructed. Benjamine Mola stated she comes to cook and clean for [employer] through Fri. Patient and Benjamine Mola both stated that the understand how to do the wound care and that the wound care should be completed once every day

## 2016-04-23 ENCOUNTER — Telehealth: Payer: Self-pay

## 2016-04-23 NOTE — Telephone Encounter (Signed)
Call placed to [facility] # [phone] to inquire if the patient has been discharged from the hospital as he has an appointment at [facility] tomorrow - 04/24/16 @ 1130. Spoke  to Colp who stated that the patient has been discharged and was at dialysis this morning.    Shelda Jakes, [facility] Team Lead - Front Office spoke to the patient to confirm his appointment and it was noted that he needs transportation to the clinic. She was then to arrange for cab transportation to the clinic.

## 2016-04-24 ENCOUNTER — Telehealth: Payer: Self-pay

## 2016-04-24 ENCOUNTER — Encounter: Payer: Self-pay | Admitting: Family Medicine

## 2016-04-24 ENCOUNTER — Ambulatory Visit: Payer: Self-pay | Attending: Family Medicine | Admitting: Family Medicine

## 2016-04-24 VITALS — BP 68/45 | HR 90 | Temp 97.4°F | Ht 63.0 in | Wt 100.8 lb

## 2016-04-24 DIAGNOSIS — T25321D Burn of third degree of right foot, subsequent encounter: Secondary | ICD-10-CM | POA: Insufficient documentation

## 2016-04-24 DIAGNOSIS — B20 Human immunodeficiency virus [HIV] disease: Secondary | ICD-10-CM | POA: Insufficient documentation

## 2016-04-24 DIAGNOSIS — K859 Acute pancreatitis without necrosis or infection, unspecified: Secondary | ICD-10-CM | POA: Insufficient documentation

## 2016-04-24 DIAGNOSIS — B182 Chronic viral hepatitis C: Secondary | ICD-10-CM | POA: Insufficient documentation

## 2016-04-24 DIAGNOSIS — Z9889 Other specified postprocedural states: Secondary | ICD-10-CM | POA: Insufficient documentation

## 2016-04-24 DIAGNOSIS — N186 End stage renal disease: Secondary | ICD-10-CM | POA: Insufficient documentation

## 2016-04-24 DIAGNOSIS — I9589 Other hypotension: Secondary | ICD-10-CM

## 2016-04-24 DIAGNOSIS — E1140 Type 2 diabetes mellitus with diabetic neuropathy, unspecified: Secondary | ICD-10-CM | POA: Insufficient documentation

## 2016-04-24 DIAGNOSIS — T300 Burn of unspecified body region, unspecified degree: Secondary | ICD-10-CM

## 2016-04-24 DIAGNOSIS — I951 Orthostatic hypotension: Secondary | ICD-10-CM | POA: Insufficient documentation

## 2016-04-24 DIAGNOSIS — Z89512 Acquired absence of left leg below knee: Secondary | ICD-10-CM | POA: Insufficient documentation

## 2016-04-24 DIAGNOSIS — Z89432 Acquired absence of left foot: Secondary | ICD-10-CM | POA: Insufficient documentation

## 2016-04-24 DIAGNOSIS — Z992 Dependence on renal dialysis: Secondary | ICD-10-CM | POA: Insufficient documentation

## 2016-04-24 DIAGNOSIS — E1122 Type 2 diabetes mellitus with diabetic chronic kidney disease: Secondary | ICD-10-CM | POA: Insufficient documentation

## 2016-04-24 DIAGNOSIS — K219 Gastro-esophageal reflux disease without esophagitis: Secondary | ICD-10-CM | POA: Insufficient documentation

## 2016-04-24 LAB — GLUCOSE, POCT (MANUAL RESULT ENTRY): POC GLUCOSE: 143 mg/dL — AB (ref 70–99)

## 2016-04-24 MED ORDER — MIDODRINE HCL 10 MG PO TABS: 10.0000 mg | ORAL_TABLET | Freq: Three times a day (TID) | ORAL | 3 refills | Status: DC

## 2016-04-24 MED ORDER — OMEPRAZOLE 40 MG PO CPDR: DELAYED_RELEASE_CAPSULE | ORAL | 3 refills | Status: DC

## 2016-04-24 NOTE — Patient Instructions (Signed)
Hypotension Hypotension, commonly called low blood pressure, is when the force of blood pumping through your arteries is too weak. Arteries are blood vessels that carry blood from the heart throughout the body. When blood pressure is too low, you may not get enough blood to your brain or to the rest of your organs. This can cause weakness, light-headedness, rapid heartbeat, and fainting. Depending on the cause and severity, hypotension may be harmless (benign) or cause serious problems (critical). What are the causes? Possible causes of hypertension include:  Blood loss.  Loss of body fluids (dehydration).  Heart problems.  Hormone (endocrine) problems.  Pregnancy.  Severe infection.  Lack of certain nutrients.  Severe allergic reactions (anaphylaxis).  Certain medicines, such as blood pressure medicine or medicines that make the body lose excess fluids (diuretics). Sometimes, hypotension can be caused by not taking medicine as directed, such as taking too much of a certain medicine. What increases the risk? Certain factors can make you more likely to develop hypotension, including:  Age. Risk increases as you get older.  Conditions that affect the heart or the central nervous system.  Taking certain medicines, such as blood pressure medicine or diuretics.  Being pregnant. What are the signs or symptoms? Symptoms of this condition may include:  Weakness.  Light-headedness.  Dizziness.  Blurred vision.  Fatigue.  Rapid heartbeat.  Fainting, in severe cases. How is this diagnosed? This condition is diagnosed based on:  Your medical history.  Your symptoms.  Your blood pressure measurement. Your health care provider will check your blood pressure when you are:  Lying down.  Sitting.  Standing. A blood pressure reading is recorded as two numbers, such as "120 over 80" (or 120/80). The first ("top") number is called the systolic pressure. It is a measure of  the pressure in your arteries as your heart beats. The second ("bottom") number is called the diastolic pressure. It is a measure of the pressure in your arteries when your heart relaxes between beats. Blood pressure is measured in a unit called mm Hg. Healthy blood pressure for adults is 120/80. If your blood pressure is below 90/60, you may be diagnosed with hypotension. Other information or tests that may be used to diagnose hypotension include:  Your other vital signs, such as your heart rate and temperature.  Blood tests.  Tilt table test. For this test, you will be safely secured to a table that moves you from a lying position to an upright position. Your heart rhythm and blood pressure will be monitored during the test. How is this treated? Treatment for this condition may include:  Changing your diet. This may involve eating more salt (sodium) or drinking more water.  Taking medicines to raise your blood pressure.  Changing the dosage of certain medicines you are taking that might be lowering your blood pressure.  Wearing compression stockings. These stockings help to prevent blood clots and reduce swelling in your legs. In some cases, you may need to go to the hospital for:  Fluid replacement. This means you will receive fluids through an IV tube.  Blood replacement. This means you will receive donated blood through an IV tube (transfusion).  Treating an infection or heart problems, if this applies.  Monitoring. You may need to be monitored while medicines that you are taking wear off. Follow these instructions at home: Eating and drinking  Drink enough fluid to keep your urine clear or pale yellow.  Eat a healthy diet and follow instructions from  your health care provider about eating or drinking restrictions. A healthy diet includes:  Fresh fruits and vegetables.  Whole grains.  Lean meats.  Low-fat dairy products.  Eat extra salt only as directed. Do not add  extra salt to your diet unless your health care provider told you to do that.  Eat frequent, small meals.  Avoid standing up suddenly after eating. Medicines  Take over-the-counter and prescription medicines only as told by your health care provider.  Follow instructions from your health care provider about changing the dosage of your current medicines, if this applies.  Do not stop or adjust any of your medicines on your own. General instructions  Wear compression stockings as told by your health care provider.  Get up slowly from lying down or sitting positions. This gives your blood pressure a chance to adjust.  Avoid hot showers and excessive heat as directed by your health care provider.  Return to your normal activities as told by your health care provider. Ask your health care provider what activities are safe for you.  Do not use any products that contain nicotine or tobacco, such as cigarettes and e-cigarettes. If you need help quitting, ask your health care provider.  Keep all follow-up visits as told by your health care provider. This is important. Contact a health care provider if:  You vomit.  You have diarrhea.  You have a fever for more than 2-3 days.  You feel more thirsty than usual.  You feel weak and tired. Get help right away if:  You have chest pain.  You have a fast or irregular heartbeat.  You develop numbness in any part of your body.  You cannot move your arms or your legs.  You have trouble speaking.  You become sweaty or feel light-headed.  You faint.  You feel short of breath.  You have trouble staying awake.  You feel confused. This information is not intended to replace advice given to you by your health care provider. Make sure you discuss any questions you have with your health care provider. Document Released: 04/29/2005 Document Revised: 11/17/2015 Document Reviewed: 10/19/2015 Elsevier Interactive Patient Education  2017  [employer].

## 2016-04-24 NOTE — Telephone Encounter (Signed)
Call placed to [facility] # [phone] to inquire about the patient's wound care orders as the patient is being seen at [facility] this morning.The patient was discharged from [facility] on 04/20/16. Spoke to ED who began to explain the wound care instructions and then transferred the call to the general surgery triage line. # [phone] - option # 2.  This CM left a voicemail message requesting a call back to # [phone] or [phone].   Call placed to  [facility] # [phone] to inquire if they received discharge instructions from the hospital. Spoke to Angie who explained the procedure for the silverlon dressing and then faxed the instructions for the wound care as well as the report from the [facility] General Surgery encounter - 04/16/16. This faxed information was shared with Dr Jarold Song.  Message received from [place], [place] - [facility] Burns/wound care. This CM returned call and [place] reviewed the silverlon dressing change procedure with this CM.  She noted that this same dressing is to be used until the patient is seen in their clinic again - appointment needed to be scheduled for 3 weeks from discharge. As per [place], the dressing is to be changed every 3 days with the expectation that the patient will receive assistance from dialysis staff if needed. The patient has been instructed how to perform the wound care. At this time he does not qualify for home care. He was not given extra dressing supplies and will need to call the surgery office if he were to require additional supplies;but the expectation is to use the current dressing until he is seen in their clinic.  He does not have insurance.  This CM then spoke with [facility] in scheduling and an appointment was scheduled for him - 05/14/16 @ 0900.  Call placed to [place] at [facility] to inform her that his next appointment at [facility] is 05/14/16 and she noted that they will need to re-schedule his dialysis to accommodate  the appointment.    This CM met with the patient when he was in the clinic today for his appointment with Dr Jarold Song. The Spanish interpreter was already in place as established by Raina Mina, RN. Informed the patient that his appointment has been scheduled to return to [facility]  - burn clinic on 05/14/16 @ 0900 and he said he does not have transportation to the to the appointment.  Explained to him that he will need to work on trying to find a ride as this appointment is very important. He then said that he work on finding a ride.  Questions arose about the wound care procedure when the patient was in the clinic and a call placed to [place], [place]  - [facility] - Burns/wound care. The phone was placed on speaker in the patient's exam room with the patient and interpreter present as well as Raina Mina, RN. [place] explained the wound care procedure and was available to answer questions.  Dr Jarold Song was then included with the call. Concerns were expressed about the the amount/type of drainage from the wound as well as the patient's ability to bathe/clean his wound, maintain the dressing on his foot. [place] to inform the provider at [facility] of the concerns/questions and then follow up with this CM.

## 2016-04-24 NOTE — Telephone Encounter (Addendum)
Call received from [place], [place] with [name] - Burn/wound care. The call was on speaker in the RN office  with Raina Mina, RN present. [place] stated that she spoke to the provider and he reported that the patient is to keep the silverlon dressing in place and is not to remove or replace it unless he is bathing and it needs to be removed. She noted that the patient was given a sterile water mist to keep the dressing moist. They will see the patient on 05/14/16. The patient has stated that he does not have transportation to [facility] and he will need to arrange for transportation.  Elby Showers, RN explained the status of the patient's wound, the amount of sanguinous draining from the wound and how the slivelon dressing was re-applied in the clinic today. [place] noted that kerlix could be used over the silverlon dressing if needed and no tape should be applied to the silverlon dressing.  This CM explained the concerns about drainage seeping from the dressing, including the kelix, over time. The patient is not scheduled to follow up with Dr Jarold Song for about 1 month. This CM inquired if the patient should follow up at the wound clinic at [facility] to have the wound assessed prior to his appointment on 05/14/16. [place] noted that she will discuss with the patient's provider.  Dwight RN to notify [facility] about the  the current wound care orders.

## 2016-04-24 NOTE — Progress Notes (Signed)
Given a cream other then silvadene- "a patch"
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
## 2016-04-25 ENCOUNTER — Telehealth: Payer: Self-pay
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
## 2016-04-25 NOTE — Telephone Encounter (Signed)
Call placed to [facility]. Spoke to [place] and informed her of the conversation with [place] from [place] /wound clinic who stated that the patient is supposed to leave the dressing in place until he is seen at their clinic on 05/15/15 . He is not to remove the dressing unless he is bathing and he has a sterile water mist bottle to use to keep the dressing moist.  Angie stated they will have an interpreter explain this to him at this next dialysis treatment. Explained to her that if she has any questions about the wound care/dressing orders, to please contact [facility] and she was in agreement.

## 2016-04-29 ENCOUNTER — Encounter (HOSPITAL_BASED_OUTPATIENT_CLINIC_OR_DEPARTMENT_OTHER): Payer: Self-pay

## 2016-04-30 ENCOUNTER — Other Ambulatory Visit: Payer: Self-pay | Admitting: Radiology

## 2016-04-30 ENCOUNTER — Other Ambulatory Visit (HOSPITAL_COMMUNITY): Payer: Self-pay | Admitting: Nephrology

## 2016-04-30 ENCOUNTER — Other Ambulatory Visit: Payer: Self-pay | Admitting: General Surgery

## 2016-04-30 DIAGNOSIS — T82868A Thrombosis of vascular prosthetic devices, implants and grafts, initial encounter: Secondary | ICD-10-CM

## 2016-05-01 ENCOUNTER — Other Ambulatory Visit (HOSPITAL_COMMUNITY): Payer: Self-pay | Admitting: Nephrology

## 2016-05-01 ENCOUNTER — Ambulatory Visit (HOSPITAL_COMMUNITY)
Admission: RE | Admit: 2016-05-01 | Discharge: 2016-05-01 | Disposition: A | Discharge status: Home / Self Care | Payer: Medicaid Other | Source: Ambulatory Visit | Attending: Nephrology | Admitting: Nephrology

## 2016-05-01 ENCOUNTER — Encounter (HOSPITAL_COMMUNITY): Payer: Self-pay

## 2016-05-01 VITALS — BP 137/83 | HR 75 | Temp 97.5°F | Resp 16 | Ht 63.0 in | Wt 94.8 lb

## 2016-05-01 DIAGNOSIS — Z8614 Personal history of Methicillin resistant Staphylococcus aureus infection: Secondary | ICD-10-CM | POA: Insufficient documentation

## 2016-05-01 DIAGNOSIS — T82868A Thrombosis of vascular prosthetic devices, implants and grafts, initial encounter: Secondary | ICD-10-CM

## 2016-05-01 DIAGNOSIS — K529 Noninfective gastroenteritis and colitis, unspecified: Secondary | ICD-10-CM | POA: Insufficient documentation

## 2016-05-01 DIAGNOSIS — B182 Chronic viral hepatitis C: Secondary | ICD-10-CM | POA: Insufficient documentation

## 2016-05-01 DIAGNOSIS — Z992 Dependence on renal dialysis: Secondary | ICD-10-CM | POA: Insufficient documentation

## 2016-05-01 DIAGNOSIS — E1122 Type 2 diabetes mellitus with diabetic chronic kidney disease: Secondary | ICD-10-CM | POA: Insufficient documentation

## 2016-05-01 DIAGNOSIS — T82858A Stenosis of vascular prosthetic devices, implants and grafts, initial encounter: Secondary | ICD-10-CM | POA: Insufficient documentation

## 2016-05-01 DIAGNOSIS — Z89512 Acquired absence of left leg below knee: Secondary | ICD-10-CM | POA: Insufficient documentation

## 2016-05-01 DIAGNOSIS — Y832 Surgical operation with anastomosis, bypass or graft as the cause of abnormal reaction of the patient, or of later complication, without mention of misadventure at the time of the procedure: Secondary | ICD-10-CM | POA: Insufficient documentation

## 2016-05-01 DIAGNOSIS — N186 End stage renal disease: Secondary | ICD-10-CM

## 2016-05-01 DIAGNOSIS — Z833 Family history of diabetes mellitus: Secondary | ICD-10-CM | POA: Insufficient documentation

## 2016-05-01 DIAGNOSIS — B20 Human immunodeficiency virus [HIV] disease: Secondary | ICD-10-CM | POA: Insufficient documentation

## 2016-05-01 DIAGNOSIS — E1140 Type 2 diabetes mellitus with diabetic neuropathy, unspecified: Secondary | ICD-10-CM | POA: Insufficient documentation

## 2016-05-01 DIAGNOSIS — D649 Anemia, unspecified: Secondary | ICD-10-CM | POA: Insufficient documentation

## 2016-05-01 HISTORY — PX: IR GENERIC HISTORICAL: IMG1180011

## 2016-05-01 LAB — PROTIME-INR
INR: 1.09
PROTHROMBIN TIME: 14.2 s (ref 11.4–15.2)

## 2016-05-01 LAB — CBC
HCT: 27.4 % — ABNORMAL LOW (ref 39.0–52.0)
Hemoglobin: 9.1 g/dL — ABNORMAL LOW (ref 13.0–17.0)
MCH: 31.4 pg (ref 26.0–34.0)
MCHC: 33.2 g/dL (ref 30.0–36.0)
MCV: 94.5 fL (ref 78.0–100.0)
PLATELETS: 248 10*3/uL (ref 150–400)
RBC: 2.9 MIL/uL — ABNORMAL LOW (ref 4.22–5.81)
RDW: 14.2 % (ref 11.5–15.5)
WBC: 5.6 10*3/uL (ref 4.0–10.5)

## 2016-05-01 LAB — APTT: APTT: 47 s — AB (ref 24–36)

## 2016-05-01 LAB — GLUCOSE, CAPILLARY: GLUCOSE-CAPILLARY: 141 mg/dL — AB (ref 65–99)

## 2016-05-01 MED ORDER — ALTEPLASE 100 MG IV SOLR
INTRAVENOUS | Status: AC | PRN
  Administered 2016-05-01: 2 mg

## 2016-05-01 MED ORDER — HEPARIN SODIUM (PORCINE) 1000 UNIT/ML IJ SOLN
INTRAMUSCULAR | Status: AC
  Filled 2016-05-01: qty 1

## 2016-05-01 MED ORDER — HEPARIN SODIUM (PORCINE) 1000 UNIT/ML IJ SOLN
INTRAMUSCULAR | Status: AC | PRN
  Administered 2016-05-01: 3000 [IU] via INTRAVENOUS

## 2016-05-01 MED ORDER — SODIUM CHLORIDE 0.9 % IV SOLN
INTRAVENOUS | Status: AC | PRN
  Administered 2016-05-01: 10 mL/h via INTRAVENOUS

## 2016-05-01 MED ORDER — MIDAZOLAM HCL 2 MG/2ML IJ SOLN
INTRAMUSCULAR | Status: AC | PRN
  Administered 2016-05-01: 1 mg via INTRAVENOUS

## 2016-05-01 MED ORDER — ALTEPLASE 2 MG IJ SOLR
INTRAMUSCULAR | Status: AC
  Filled 2016-05-01: qty 2

## 2016-05-01 MED ORDER — IOPAMIDOL (ISOVUE-300) INJECTION 61%
INTRAVENOUS | Status: AC
  Administered 2016-05-01: 40 mL
  Filled 2016-05-01: qty 100

## 2016-05-01 MED ORDER — FENTANYL CITRATE (PF) 100 MCG/2ML IJ SOLN
INTRAMUSCULAR | Status: AC | PRN
  Administered 2016-05-01: 25 ug via INTRAVENOUS

## 2016-05-01 MED ORDER — LIDOCAINE HCL (PF) 1 % IJ SOLN
INTRAMUSCULAR | Status: AC
  Administered 2016-05-01: 10 mL
  Filled 2016-05-01: qty 30

## 2016-05-01 MED ORDER — MIDAZOLAM HCL 2 MG/2ML IJ SOLN
INTRAMUSCULAR | Status: AC
  Filled 2016-05-01: qty 2

## 2016-05-01 MED ORDER — FENTANYL CITRATE (PF) 100 MCG/2ML IJ SOLN
INTRAMUSCULAR | Status: AC
  Filled 2016-05-01: qty 2

## 2016-05-01 MED ORDER — SODIUM CHLORIDE 0.9 % IV SOLN: INTRAVENOUS | Status: DC

## 2016-05-01 NOTE — Procedures (Signed)
Declot L arm HD fistula, venous PTA No complication No blood loss. See complete dictation in [facility].
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
## 2016-05-01 NOTE — Discharge Instructions (Signed)
Fistulogram, Care After °Introduction °Refer to this sheet in the next few weeks. These instructions provide you with information on caring for yourself after your procedure. Your health care provider may also give you more specific instructions. Your treatment has been planned according to current medical practices, but problems sometimes occur. Call your health care provider if you have any problems or questions after your procedure. °What can I expect after the procedure? °After your procedure, it is typical to have the following: °· A small amount of discomfort in the area where the catheters were placed. °· A small amount of bruising around the fistula. °· Sleepiness and fatigue. °Follow these instructions at home: °· Rest at home for the day following your procedure. °· Do not drive or operate heavy machinery while taking pain medicine. °· Take medicines only as directed by your health care provider. °· Do not take baths, swim, or use a hot tub until your health care provider approves. You may shower 24 hours after the procedure or as directed by your health care provider. °· There are many different ways to close and cover an incision, including stitches, skin glue, and adhesive strips. Follow your health care provider's instructions on: °¨ Incision care. °¨ Bandage (dressing) changes and removal. °¨ Incision closure removal. °· Monitor your dialysis fistula carefully. °Contact a health care provider if: °· You have drainage, redness, swelling, or pain at your catheter site. °· You have a fever. °· You have chills. °Get help right away if: °· You feel weak. °· You have trouble balancing. °· You have trouble moving your arms or legs. °· You have problems with your speech or vision. °· You can no longer feel a vibration or buzz when you put your fingers over your dialysis fistula. °· The limb that was used for the procedure: °¨ Swells. °¨ Is painful. °¨ Is cold. °¨ Is discolored, such as blue or pale  white. °This information is not intended to replace advice given to you by your health care provider. Make sure you discuss any questions you have with your health care provider. °Document Released: 09/13/2013 Document Revised: 10/05/2015 Document Reviewed: 06/18/2013 °© 2017 Elsevier ° °

## 2016-05-07 ENCOUNTER — Emergency Department (HOSPITAL_COMMUNITY)
Admission: RE | Admit: 2016-05-07 | Discharge: 2016-05-07 | Disposition: A | Discharge status: Home / Self Care | Payer: Self-pay | Source: Ambulatory Visit | Attending: Nephrology | Admitting: Nephrology

## 2016-05-07 ENCOUNTER — Encounter (HOSPITAL_COMMUNITY): Payer: Self-pay | Admitting: Emergency Medicine

## 2016-05-07 ENCOUNTER — Other Ambulatory Visit (HOSPITAL_COMMUNITY): Payer: Self-pay | Admitting: Nephrology

## 2016-05-07 ENCOUNTER — Encounter (HOSPITAL_COMMUNITY): Payer: Self-pay

## 2016-05-07 ENCOUNTER — Emergency Department (HOSPITAL_COMMUNITY)
Admission: EM | Admit: 2016-05-07 | Discharge: 2016-05-07 | Disposition: A | Discharge status: Home / Self Care | Payer: Self-pay | Attending: Emergency Medicine | Admitting: Emergency Medicine

## 2016-05-07 DIAGNOSIS — B20 Human immunodeficiency virus [HIV] disease: Secondary | ICD-10-CM | POA: Insufficient documentation

## 2016-05-07 DIAGNOSIS — Z89512 Acquired absence of left leg below knee: Secondary | ICD-10-CM | POA: Insufficient documentation

## 2016-05-07 DIAGNOSIS — E1140 Type 2 diabetes mellitus with diabetic neuropathy, unspecified: Secondary | ICD-10-CM | POA: Insufficient documentation

## 2016-05-07 DIAGNOSIS — Z79899 Other long term (current) drug therapy: Secondary | ICD-10-CM | POA: Insufficient documentation

## 2016-05-07 DIAGNOSIS — Z992 Dependence on renal dialysis: Secondary | ICD-10-CM | POA: Insufficient documentation

## 2016-05-07 DIAGNOSIS — Y832 Surgical operation with anastomosis, bypass or graft as the cause of abnormal reaction of the patient, or of later complication, without mention of misadventure at the time of the procedure: Secondary | ICD-10-CM | POA: Insufficient documentation

## 2016-05-07 DIAGNOSIS — T82868A Thrombosis of vascular prosthetic devices, implants and grafts, initial encounter: Secondary | ICD-10-CM

## 2016-05-07 DIAGNOSIS — E1122 Type 2 diabetes mellitus with diabetic chronic kidney disease: Secondary | ICD-10-CM | POA: Insufficient documentation

## 2016-05-07 DIAGNOSIS — N186 End stage renal disease: Secondary | ICD-10-CM | POA: Insufficient documentation

## 2016-05-07 DIAGNOSIS — B182 Chronic viral hepatitis C: Secondary | ICD-10-CM | POA: Insufficient documentation

## 2016-05-07 DIAGNOSIS — T82868D Thrombosis of vascular prosthetic devices, implants and grafts, subsequent encounter: Secondary | ICD-10-CM | POA: Insufficient documentation

## 2016-05-07 HISTORY — PX: IR GENERIC HISTORICAL: IMG1180011

## 2016-05-07 LAB — CBC
HEMATOCRIT: 23.3 % — AB (ref 39.0–52.0)
Hemoglobin: 7.5 g/dL — ABNORMAL LOW (ref 13.0–17.0)
MCH: 31.1 pg (ref 26.0–34.0)
MCHC: 32.2 g/dL (ref 30.0–36.0)
MCV: 96.7 fL (ref 78.0–100.0)
Platelets: 202 10*3/uL (ref 150–400)
RBC: 2.41 MIL/uL — ABNORMAL LOW (ref 4.22–5.81)
RDW: 14.6 % (ref 11.5–15.5)
WBC: 5.3 10*3/uL (ref 4.0–10.5)

## 2016-05-07 LAB — BASIC METABOLIC PANEL
ANION GAP: 10 (ref 5–15)
BUN: 39 mg/dL — AB (ref 6–20)
CALCIUM: 8.5 mg/dL — AB (ref 8.9–10.3)
CO2: 20 mmol/L — AB (ref 22–32)
Chloride: 108 mmol/L (ref 101–111)
Creatinine, Ser: 11.54 mg/dL — ABNORMAL HIGH (ref 0.61–1.24)
GFR calc Af Amer: 5 mL/min — ABNORMAL LOW (ref >60)
GFR calc non Af Amer: 5 mL/min — ABNORMAL LOW (ref >60)
GLUCOSE: 87 mg/dL (ref 65–99)
POTASSIUM: 4.5 mmol/L (ref 3.5–5.1)
Sodium: 138 mmol/L (ref 135–145)

## 2016-05-07 LAB — PROTIME-INR
INR: 1.06
Prothrombin Time: 13.8 seconds (ref 11.4–15.2)

## 2016-05-07 MED ORDER — FENTANYL CITRATE (PF) 100 MCG/2ML IJ SOLN
INTRAMUSCULAR | Status: AC
  Filled 2016-05-07: qty 2

## 2016-05-07 MED ORDER — HEPARIN SODIUM (PORCINE) 1000 UNIT/ML IJ SOLN
INTRAMUSCULAR | Status: AC
  Filled 2016-05-07: qty 1

## 2016-05-07 MED ORDER — CEFAZOLIN SODIUM-DEXTROSE 2-4 GM/100ML-% IV SOLN
2.0000 g | Freq: Once | INTRAVENOUS | Status: AC
  Administered 2016-05-07: 2 g via INTRAVENOUS

## 2016-05-07 MED ORDER — LIDOCAINE HCL (PF) 1 % IJ SOLN
INTRAMUSCULAR | Status: AC
  Filled 2016-05-07: qty 30

## 2016-05-07 MED ORDER — HYDROCODONE-ACETAMINOPHEN 5-325 MG PO TABS: 1.0000 | ORAL_TABLET | Freq: Once | ORAL | Status: DC

## 2016-05-07 MED ORDER — FENTANYL CITRATE (PF) 100 MCG/2ML IJ SOLN
INTRAMUSCULAR | Status: AC | PRN
  Administered 2016-05-07: 50 ug via INTRAVENOUS
  Administered 2016-05-07: 25 ug via INTRAVENOUS

## 2016-05-07 MED ORDER — SODIUM CHLORIDE 0.9 % IV SOLN
INTRAVENOUS | Status: AC | PRN
  Administered 2016-05-07: 10 mL/h via INTRAVENOUS

## 2016-05-07 MED ORDER — LIDOCAINE HCL (PF) 1 % IJ SOLN
INTRAMUSCULAR | Status: AC | PRN
  Administered 2016-05-07: 8 mL

## 2016-05-07 MED ORDER — CEFAZOLIN SODIUM-DEXTROSE 2-4 GM/100ML-% IV SOLN
INTRAVENOUS | Status: AC
  Filled 2016-05-07: qty 100

## 2016-05-07 NOTE — ED Notes (Signed)
Pt to go to IR per [place], EDP, to keep scheduled appt.

## 2016-05-07 NOTE — ED Provider Notes (Signed)
Alvin Daniels is a 45 y.o. Hispanic male with a PMHx of ERSD on dialysis Tu/Th/Sat, AIDS/HIV, DM2, and multiple other conditions, who presents to the ED due to a communication mix-up. Patient was called today by his dialysis center stating that he had an appointment with interventional radiology at 11 AM to have his fistula dethrombosed. Patient states that last week on the 20th he had the same issue, successful thrombectomy was performed, had dialysis on Thursday and that went well, but then Saturday his fistula wasn't working again so his dialysis center attempted to call the doctor but they weren't open so the center told him that on Tuesday he would call the doctor back which is what was done today. He's not sure of the name of the dialysis center, or the name of the physician, but per chart review he was seen last week by the interventional radiology group [name] PA-C and [name] MD) for IR thrombectomy AV fistula w/ thrombolysis/[facility]. He was under the impression today that he was being taken to the IR doctor's office (which he recalls was a "small room here in the hospital". He requires language interpreter (myself) in order to get the above details. He denies any symptoms including fevers, chills, CP, SOB, abd pain, N/V/D/C, hematuria, dysuria, numbness, tingling, weakness, warmth/redness/swelling to fistula, or any other complaints.    Exam: Gen: afebrile, VSS, NAD Pulm: lungs clear, no rales CV: rate WNL, no m/r/g MsK: L forearm fistula without a thrill or bruit, strong pulse, no warmth/swelling/redness   I called the on call IR physician, and Dr. Mayer Camel team called me back and stated that they were expecting him and it was a mix up that he went to the ER, so to please un-do his encounter here and bring him to the radiology nurses station where they were waiting on him.  Due to the fact that he was already seen in the ER and IV was started by nursing  staff, charge nurse and registration stated we couldn't un-do the encounter, however we promptly "discharged" him from the ER and wheeled him over the the IR location for his originally intended procedure. Please see his outpatient notes for further documentation of that visit. He has no other emergent medical needs and can be discharged without further ER work up.     [name], PA-C 05/07/16 [place], DO 05/10/16 [phone]

## 2016-05-07 NOTE — ED Notes (Addendum)
Pt taken to Radiology waiting room and checked in. Instructed her to call Dewitt Hoes with questions.

## 2016-05-07 NOTE — Procedures (Signed)
Interventional Radiology Procedure Note  Procedure: Placement of a right IJ approach HD catheter.  19cm tip to cuff.  Tip is positioned at the superior cavoatrial junction and catheter is ready for immediate use.  Complications: None Recommendations:  - Ok to use catheter - may DC home when goals met - Do not submerge  - Routine catheter care   Signed,  Dulcy Fanny. Earleen Newport, DO

## 2016-05-07 NOTE — Discharge Instructions (Signed)
DO NOT GET SITE WET; DO NOT USE SCISSORS NEAR SITE; KEEP BLUE CLAMPS WITH YOU AT ALL TIMES AND IF CAP COMES OFF OR IF CATHETER GETS A HOLE IN IT CLAMP WITH BLUE CLAMPS NEAR SKIN AND GO TO EMERGENCY ROOM; CALL [phone] IF ANY PROBLEMS,QUESTIONS, OR CONCERNS AND CALL IF ANY BLEEDING,DRAINAGE,FEVER,PAIN,SWELLING, OR REDNESS AT [place]    Vascular Access for Hemodialysis A vascular access is a connection between two blood vessels that allows blood to be easily removed from the body and returned to the body during hemodialysis. Hemodialysis is a procedure in which a machine outside of the body filters the blood. There are three types of vascular accesses:   Arteriovenous fistula. This is a connection between an artery and a vein (usually in the arm) that is made by sewing them together. Blood in the artery flows directly into the vein, causing it to get larger over time. This makes it easier for the vein to be used for hemodialysis. An arteriovenous fistula takes 1-6 months to develop after surgery.   Arteriovenous graft. This is a connection between an artery and a vein in the arm that is made with a tube. An arteriovenous graft can be used within 2-3 weeks of surgery.   Venous catheter. This is a thin, flexible tube that is placed in a large vein (usually in the neck, chest, or groin). A venous catheter for hemodialysis contains two tubes that come out of the skin. A venous catheter can be used right away. It is usually used as a temporary access if you need hemodialysis before a fistula or graft has developed. It may also be used as a permanent access if a fistula or graft cannot be created. WHICH TYPE OF ACCESS IS BEST FOR ME? The type of access that is best for you depends on the size and strength of your veins.  A fistula is usually the preferred type of access. It can last several years and is less likely than the other types of accesses to become infected or to cause blood clots  within a blood vessel (thrombosis). However, a fistula is not an option for everyone. If your veins are not the right size, a graft may be used instead. Grafts require you to have strong veins. If your veins are not strong enough for a graft, a catheter may be used. Catheters are more likely than fistulas and grafts to become infected or to have thrombosis.  Sometimes, only one type of access is an option. Your health care provider will help you determine which type of access is best for you.  HOW IS A VASCULAR ACCESS USED? The way the access is used depends on the type of access:   If the access is a fistula or graft, two needles are inserted through the skin into the access before each hemodialysis session. Blood leaves the body through one of the needles and travels through a tube to the hemodialysis machine (dialyzer). It then flows through another tube and returns to the body through the second needle.   If the access is a catheter, one tube is connected directly to the tube that leads to the dialyzer and the other is connected to a tube that leads away from the dialyzer. Blood leaves the body through one tube and returns to the body through the other.  WHAT KIND OF PROBLEMS CAN OCCUR WITH VASCULAR ACCESSES?  Blood clots within a blood vessel (thrombosis). Thrombosis can lead to a narrowing of a  blood vessel or tube (stenosis). If thrombosis occurs frequently, another access site may be created as a backup.   Infection.  These problems are most likely to occur with a venous catheter and least likely to occur with an arteriovenous fistula.  HOW DO I CARE FOR MY VASCULAR ACCESS? Wear a medical alert bracelet. This tells health care providers that you are a dialysis patient in the case of an emergency and allows them to care for your veins appropriately. If you have a graft or fistula:   A "bruit" is a noise that is heard with a stethoscope and a "thrill" is a vibration felt over the graft or  fistula. The presence of the bruit and thrill indicates that the access is working. You will be taught to feel for the thrill each day. If this is not felt, the access may be clotted. Call your health care provider.   You may use the arm where your vascular access is located freely after the site heals. Keep the following in mind:   Avoid pressure on the arm.   Avoid lifting heavy objects with the arm.   Avoid sleeping on the arm.   Avoid wearing tight-sleeved shirts or jewelry around the graft or fistula.   Do not allow blood pressure monitoring or needle punctures on the side where the graft or fistula is located.   With permission from your health care provider, you may do exercises to help with blood flow through a fistula. These exercises involve squeezing a rubber ball or other soft objects as instructed. SEEK MEDICAL CARE IF:   Chills develop.   You have an oral temperature above 102 F (38.9 C).  Swelling around the graft or fistula gets worse.   New pain develops.   Pus or other fluid (drainage) is seen at the vascular access site.   Skin redness or red streaking is seen on the skin around, above, or below the vascular access. SEEK IMMEDIATE MEDICAL CARE IF:   Pain, numbness, or an unusual pale skin color develops in the hand on the side of your fistula.   Dizziness or weakness develops that you have not had before.   The vascular access has bleeding that cannot be easily controlled. This information is not intended to replace advice given to you by your health care provider. Make sure you discuss any questions you have with your health care provider. Document Released: 07/20/2002 Document Revised: 05/20/2014 Document Reviewed: 09/15/2012 Elsevier Interactive Patient Education  2017 [employer].

## 2016-05-07 NOTE — ED Triage Notes (Signed)
PT has not been to diaylsis in 5 days; [employer] called out to check on him. Pt states graft is not working now. He states he does not have transport. A&Ox3, no SOB per EMS. CBG 112.

## 2016-05-07 NOTE — H&P (Signed)
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 - Out-pt  History of Present Illness: Alvin Daniels is a 45 y.o. male with a complex PMH including HIV, hep C, ESRD, and DM who goes to HD on T,TH,S.   His fistula worked well Saturday, but he noticed Sunday there was no thrill.  He contacted his nephrologist who has arranged for a fistulagram with possible intervention which was done by Dr. Vernard Gambles on 05/01/2016.   Today he reports it has stopped working again. He is here now for a tunneled HD catheter.  He denies any recent fever/chills.  He last ate at 8 am.  He does not take blood thinners.  Hospital provided interpreter used today.  Past Medical History:  Diagnosis Date  . AIDS ([place]) 11/22/2014  . Chronic diarrhea   . Chronic hepatitis C without hepatic coma ([place]) 11/22/2014  . Diabetic neuropathy ([place])   . ESRD (end stage renal disease) on dialysis [facility])    "TTS; don't remember street name" (05/03/2014)  . Hepatitis C   . HIV INFECTION 06/27/2010   Qualifier: Diagnosis of  By: Nickola Major CMA ( [place]), Geni Bers    . Hypotension 06/02/2012  . Metabolic bone disease 01/15/6386  . MRSA infection   . Normocytic anemia 06/17/2012  . Pancreatitis   . Pressure ulcer of BKA stump, stage 2 ([place]) 11/22/2014  . Renal disorder   . Severe protein-calorie malnutrition ([place]) 06/17/2012  . Uncontrolled diabetes mellitus with complications ([place]) 5/64/3329   Annotation: uncontrolled Qualifier: Diagnosis of  By: Nickola Major CMA Deborra Medina), Geni Bers      Past Surgical History:  Procedure Laterality Date  . AMPUTATION Left 04/20/2014   Procedure: 3rd toe amputation, 4th Toe Amputation,  5th Toe Amputation;  Surgeon: Newt Minion, MD;  Location: [place];  Service: Orthopedics;  Laterality: Left;  . AMPUTATION Left 05/02/2014   Procedure: Midfoot Amputation;  Surgeon: Newt Minion, MD;  Location: [place];  Service: Orthopedics;  Laterality: Left;  . AMPUTATION Left 06/17/2014   Procedure: AMPUTATION BELOW KNEE;  Surgeon: Newt Minion, MD;  Location: [place];  Service: Orthopedics;  Laterality: Left;  . AV FISTULA PLACEMENT Left   . FOOT AMPUTATION THROUGH ANKLE Left 12/'21/2015   midfoot  . IR GENERIC HISTORICAL Left 05/01/2016   IR THROMBECTOMY AV FISTULA W/THROMBOLYSIS/PTA INC/SHUNT/IMG LEFT 05/01/2016 [name], MD [facility]-INTERV RAD  . IR GENERIC HISTORICAL  05/01/2016   IR US GUIDE VASC ACCESS LEFT 05/01/2016 [name], MD [facility]-INTER[facility] RAD    Allergies: Patient has no known allergies.  Medications: Prior to Admission medications   Medication Sig Start Date End Date Taking? Authorizing Provider  HYDROcodone-acetaminophen (NORCO) 5-325 MG tablet Take 1 tablet by mouth every 6 (six) hours as needed. 04/04/16  Yes Bonnielee Haff, MD  lamiVUDine (EPIVIR) 10 MG/ML solution Take 2.5 mLs (25 mg total) by mouth daily. 04/04/16  Yes Bonnielee Haff, MD  lanthanum (FOSRENOL) 500 MG chewable tablet Chew 1 tablet (500 mg total) by mouth 3 (three) times daily with meals. 03/07/16  Yes Marijean Heath, NP  metroNIDAZOLE (FLAGYL) 500 MG tablet Take 1 tablet (500 mg total) by mouth every 8 (eight) hours. 04/04/16  Yes Bonnielee Haff, MD  midodrine (PROAMATINE) 10 MG tablet Take 1 tablet (10 mg total) by mouth 3 (three) times daily with meals. 04/24/16  Yes [name], MD  multivitamin (RENA-VIT) TABS tablet Take  1 tablet by mouth at bedtime. 03/06/16  Yes Marijean Heath, NP  omeprazole (PRILOSEC) 40 MG capsule TAKE 1 CAPSULE BY MOUTH EVERY DAY 04/24/16  Yes [name], MD  tenofovir (VIREAD) 300 MG tablet TAKE 1 TABLET BY MOUTH ONCE A WEEK ON SUNDAY 02/23/16  Yes Truman Hayward, MD  TIVICAY 50 MG tablet TAKE 1 TABLET BY MOUTH EVERY DAY 02/23/16  Yes Truman Hayward, MD  silver sulfADIAZINE (SILVADENE) 1 % cream Apply topically daily. Patient not  taking: Reported on 05/07/2016 04/04/16   Bonnielee Haff, MD     Family History  Problem Relation Age of Onset  . Diabetes Mother   . Diabetes Father     Social History   Social History  . Marital status: Single    Spouse name: N/A  . Number of children: N/A  . Years of education: N/A   Social History Main Topics  . Smoking status: Never Smoker  . Smokeless tobacco: Never Used  . Alcohol use No  . Drug use: No  . Sexual activity: No   Other Topics Concern  . None   Social History Narrative   ** Merged History Encounter **         Review of Systems: A 12 point ROS discussed  Review of Systems  Constitutional: Negative.   HENT: Negative.   Respiratory: Negative.   Cardiovascular: Negative.   Gastrointestinal: Negative.   Musculoskeletal: Positive for gait problem.       Left BKA  Skin:       Ulcer right foot (dressings not removed)  Hematological: Negative.   Psychiatric/Behavioral: Negative.     Vital Signs: BP (!) 92/47   Pulse 78   Temp 97.6 F (36.4 C) (Oral)   Resp 16   Ht 5\' 4"  (1.626 m)   Wt 94 lb (42.6 kg)   SpO2 100%   BMI 16.14 kg/m   Physical Exam  Constitutional: He appears well-developed and well-nourished.  HENT:  Head: Normocephalic and atraumatic.  Eyes: EOM are normal.  Neck: Normal range of motion.  Cardiovascular: Normal rate, regular rhythm and normal heart sounds.   No murmur heard. Pulmonary/Chest: Effort normal. No respiratory distress. He has no wheezes.  Abdominal: Soft. He exhibits no distension. There is no tenderness.  Musculoskeletal:  Left BKA  Skin:  Ulcer right foot, dressing not removed  Psychiatric: He has a normal mood and affect. His behavior is normal. Judgment and thought content normal.  Vitals reviewed. Left arm AV fistula without thrill/bruit  Mallampati Score:  MD Evaluation Airway: WNL Heart: WNL Abdomen: WNL Chest/ Lungs: WNL ASA  Classification: 3 Mallampati/Airway Score:  Two  Imaging: Ir US Guide Vasc Access Left  Result Date: 05/01/2016 CLINICAL DATA:  Occluded left forearm AV hemodialysis fistula EXAM: DIALYSIS FISTULA DECLOT VENOUS ANGIOPLASTY ULTRASOUND GUIDANCE FOR VASCULAR ACCESS FLUOROSCOPY TIME:  2 minutes 42 second, 4 mGy TECHNIQUE: The procedure, risks (including but not limited to bleeding, infection, organ damage ), benefits, and alternatives were explained to the patient with the aid of interpreter. Questions regarding the procedure were encouraged and answered. The patient understands and consents to the procedure. The outflow vein of the fistula just central to the anastomosis was accessed antegrade with 21-gauge micropuncture needle under real-time ultrasonic guidance after the overlying skin prepped with chlorhexidine, draped in usual sterile fashion, infiltrated locally with 1%lidocaine. Needle exchanged over 018guidewire for transitional dilator through which 2 mg t-PA was administered. Ultrasound images were stored. Through the  dilator, a Bentson wire was advanced . Over this a 3F sheath was placed, through which a 5 [place] Kumpe catheter was advanced for outflow venography. This showed patency of the outflow cephalic vein above the elbow through the SVC . 3000 units heparin were administered IV. The Kumpe was exchanged over guide wire for the AngioJet device, used to mechanically thrombolyse the outflow vein and remove the thrombus from the native outflow. Outflow venography showed restoration of flow antegrade. 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 A 7 mm x 4 cm Conquest angioplasty balloon was advanced into the outflow vein. Angioplasty of the venous stenosis was performed. 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 The Angiojet device was again advanced in used to further mechanically thrombolysis the residual thrombus  in the outflow vein. Follow-up venography showed further clearance of residual thrombus. There was some recoil at the site of venous angioplasty so this site was further dilated with an 8 mm Mustang balloon for 60 seconds. Follow-up outflow venogram shows good flow through this segment with no residual or recurrent stenosis. No extravasation or apparent complication of angioplasty. Follow up shuntogram showed good flow through the outflow vein, no extravasation. The catheter and sheath were then removed and hemostasis achieved with 2-0 Ethilon suture anchor. Patient tolerated procedure well. IMPRESSION: 1. Technically successful declot of left forearm native AV hemodialysis fistula. 2. Technically successful balloon angioplasty of venous outflow stenosis. ACCESS: Remains approachable for percutaneous intervention as needed. Electronically Signed   By: Lucrezia Europe M.D.   On: 05/01/2016 13:02   Ir Thrombectomy Av Fistula W/thrombolysis/pta Inc/shunt/img Left  Result Date: 05/01/2016 CLINICAL DATA:  Occluded left forearm AV hemodialysis fistula EXAM: DIALYSIS FISTULA DECLOT VENOUS ANGIOPLASTY ULTRASOUND GUIDANCE FOR VASCULAR ACCESS FLUOROSCOPY TIME:  2 minutes 42 second, 4 mGy TECHNIQUE: The procedure, risks (including but not limited to bleeding, infection, organ damage ), benefits, and alternatives were explained to the patient with the aid of interpreter. Questions regarding the procedure were encouraged and answered. The patient understands and consents to the procedure. The outflow vein of the fistula just central to the anastomosis was accessed antegrade with 21-gauge micropuncture needle under real-time ultrasonic guidance after the overlying skin prepped with chlorhexidine, draped in usual sterile fashion, infiltrated locally with 1%lidocaine. Needle exchanged over 018guidewire for transitional dilator through which 2 mg t-PA was administered. Ultrasound images were stored. Through the dilator, a Bentson  wire was advanced . Over this a 3F sheath was placed, through which a 5 [place] Kumpe catheter was advanced for outflow venography. This showed patency of the outflow cephalic vein above the elbow through the SVC . 3000 units heparin were administered IV. The Kumpe was exchanged over guide wire for the AngioJet device, used to mechanically thrombolyse the outflow vein and remove the thrombus from the native outflow. Outflow venography showed restoration of flow antegrade. 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 A 7 mm x 4 cm Conquest angioplasty balloon was advanced into the outflow vein. Angioplasty of the venous stenosis was performed. 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 The Angiojet device was again advanced in used to further mechanically thrombolysis the residual thrombus in the outflow vein. Follow-up venography showed further clearance of residual thrombus. There was some recoil at the site of venous angioplasty  so this site was further dilated with an 8 mm Mustang balloon for 60 seconds. Follow-up outflow venogram shows good flow through this segment with no residual or recurrent stenosis. No extravasation or apparent complication of angioplasty. Follow up shuntogram showed good flow through the outflow vein, no extravasation. The catheter and sheath were then removed and hemostasis achieved with 2-0 Ethilon suture anchor. Patient tolerated procedure well. IMPRESSION: 1. Technically successful declot of left forearm native AV hemodialysis fistula. 2. Technically successful balloon angioplasty of venous outflow stenosis. ACCESS: Remains approachable for percutaneous intervention as needed. Electronically Signed   By: Lucrezia Europe M.D.   On: 05/01/2016 13:02    Labs:  CBC:  Recent Labs  04/02/16 0525 04/03/16 0530 04/04/16 0537 05/01/16 0730  WBC 18.1* 19.4*  16.2* 5.6  HGB 9.3* 10.3* 10.1* 9.1*  HCT 27.9* 31.7* 31.3* 27.4*  PLT 371 418* 392 248    COAGS:  Recent Labs  03/03/16 1927 05/01/16 0730 05/07/16 1140  INR 1.37 1.09 1.06  APTT  --  47*  --     BMP:  Recent Labs  03/07/16 0520 03/16/16 1216 04/01/16 2336 04/03/16 0530  NA 138 138 135 135  K 3.3* 3.3* 3.4* 4.1  CL 99* 97* 97* 96*  CO2 25 32 26 26  GLUCOSE 187* 90 71 122*  BUN 37* 5* 9 15  CALCIUM 8.7* 8.0* 8.4* 8.5*  CREATININE 10.74* 3.38* 3.94* 7.13*  GFRNONAA 5* 20* 17* 8*  GFRAA 6* 24* 20* 10*    LIVER FUNCTION TESTS:  Recent Labs  03/03/16 1927 03/04/16 0616  03/06/16 0617 03/07/16 0520 03/16/16 1216 04/01/16 2336  BILITOT 0.9 1.1  --   --   --  0.6 0.5  AST 23 31  --   --   --  23 32  ALT 10* 10*  --   --   --  10* 10*  ALKPHOS 81 78  --   --   --  72 78  PROT 6.9 6.7  --   --   --  6.9 7.7  ALBUMIN 2.5* 2.4*  < > 2.3* 2.5* 2.4* 2.1*  < > = values in this interval not displayed.  TUMOR MARKERS: No results for input(s): AFPTM, CEA, CA199, CHROMGRNA in the last 8760 hours.  Assessment and Plan:  ESRD on HD again with thrombosed AV fistula  Will place tunneled HD catheter today by Dr. Earleen Newport.  Risks and Benefits discussed with the patient including, but not limited to bleeding, infection, vascular injury, pneumothorax which may require chest tube placement, air embolism or even death  All of the patient's questions were answered, patient is agreeable to proceed. Consent signed and in chart.  Thank you for this interesting consult.  I greatly enjoyed meeting Laura Cardiff and look forward to participating in their care.  A copy of this report was sent to the requesting provider on this date.  Electronically Signed: Murrell Redden PA-C 05/07/2016, 12:33 PM   I spent a total of    25 Minutes in face to face in clinical consultation, greater than 50% of which was counseling/coordinating care for HD catheter

## 2016-05-07 NOTE — Discharge Instructions (Signed)
You were accidentally brought to the ER today. You will be taken to your appointment at the interventional radiologists office now. Follow up with your doctors as instructed by your dialysis center. Have a pleasant day!

## 2016-05-08 ENCOUNTER — Emergency Department (HOSPITAL_COMMUNITY): Payer: Self-pay

## 2016-05-08 ENCOUNTER — Encounter (HOSPITAL_COMMUNITY): Payer: Self-pay

## 2016-05-08 ENCOUNTER — Inpatient Hospital Stay (HOSPITAL_COMMUNITY)
Admission: EM | Admit: 2016-05-08 | Discharge: 2016-05-10 | DRG: 252 | Disposition: A | Discharge status: Home / Self Care | Payer: Self-pay | Attending: Family Medicine | Admitting: Family Medicine

## 2016-05-08 DIAGNOSIS — Z794 Long term (current) use of insulin: Secondary | ICD-10-CM

## 2016-05-08 DIAGNOSIS — B182 Chronic viral hepatitis C: Secondary | ICD-10-CM | POA: Diagnosis present

## 2016-05-08 DIAGNOSIS — I120 Hypertensive chronic kidney disease with stage 5 chronic kidney disease or end stage renal disease: Secondary | ICD-10-CM | POA: Diagnosis present

## 2016-05-08 DIAGNOSIS — N186 End stage renal disease: Secondary | ICD-10-CM | POA: Diagnosis present

## 2016-05-08 DIAGNOSIS — T82868A Thrombosis of vascular prosthetic devices, implants and grafts, initial encounter: Secondary | ICD-10-CM

## 2016-05-08 DIAGNOSIS — Z89512 Acquired absence of left leg below knee: Secondary | ICD-10-CM

## 2016-05-08 DIAGNOSIS — E1142 Type 2 diabetes mellitus with diabetic polyneuropathy: Secondary | ICD-10-CM | POA: Diagnosis present

## 2016-05-08 DIAGNOSIS — E1122 Type 2 diabetes mellitus with diabetic chronic kidney disease: Secondary | ICD-10-CM | POA: Diagnosis present

## 2016-05-08 DIAGNOSIS — Z992 Dependence on renal dialysis: Secondary | ICD-10-CM

## 2016-05-08 DIAGNOSIS — B20 Human immunodeficiency virus [HIV] disease: Secondary | ICD-10-CM

## 2016-05-08 DIAGNOSIS — N2581 Secondary hyperparathyroidism of renal origin: Secondary | ICD-10-CM | POA: Diagnosis present

## 2016-05-08 DIAGNOSIS — T25321A Burn of third degree of right foot, initial encounter: Secondary | ICD-10-CM | POA: Diagnosis present

## 2016-05-08 DIAGNOSIS — E43 Unspecified severe protein-calorie malnutrition: Secondary | ICD-10-CM | POA: Diagnosis present

## 2016-05-08 DIAGNOSIS — D631 Anemia in chronic kidney disease: Secondary | ICD-10-CM | POA: Diagnosis present

## 2016-05-08 DIAGNOSIS — M898X9 Other specified disorders of bone, unspecified site: Secondary | ICD-10-CM | POA: Diagnosis present

## 2016-05-08 DIAGNOSIS — Z681 Body mass index (BMI) 19 or less, adult: Secondary | ICD-10-CM

## 2016-05-08 LAB — CBC WITH DIFFERENTIAL/PLATELET
Basophils Absolute: 0.1 10*3/uL (ref 0.0–0.1)
Basophils Relative: 1 %
Eosinophils Absolute: 0.2 10*3/uL (ref 0.0–0.7)
Eosinophils Relative: 4 %
HEMATOCRIT: 23.2 % — AB (ref 39.0–52.0)
Hemoglobin: 7.5 g/dL — ABNORMAL LOW (ref 13.0–17.0)
LYMPHS ABS: 1.2 10*3/uL (ref 0.7–4.0)
LYMPHS PCT: 24 %
MCH: 31.1 pg (ref 26.0–34.0)
MCHC: 32.3 g/dL (ref 30.0–36.0)
MCV: 96.3 fL (ref 78.0–100.0)
MONO ABS: 0.3 10*3/uL (ref 0.1–1.0)
MONOS PCT: 6 %
NEUTROS ABS: 3.2 10*3/uL (ref 1.7–7.7)
Neutrophils Relative %: 65 %
Platelets: 199 10*3/uL (ref 150–400)
RBC: 2.41 MIL/uL — ABNORMAL LOW (ref 4.22–5.81)
RDW: 14.6 % (ref 11.5–15.5)
WBC: 5 10*3/uL (ref 4.0–10.5)

## 2016-05-08 LAB — RENAL FUNCTION PANEL
ANION GAP: 10 (ref 5–15)
Albumin: 2 g/dL — ABNORMAL LOW (ref 3.5–5.0)
BUN: 8 mg/dL (ref 6–20)
CALCIUM: 7.6 mg/dL — AB (ref 8.9–10.3)
CO2: 26 mmol/L (ref 22–32)
Chloride: 96 mmol/L — ABNORMAL LOW (ref 101–111)
Creatinine, Ser: 3.64 mg/dL — ABNORMAL HIGH (ref 0.61–1.24)
GFR calc non Af Amer: 19 mL/min — ABNORMAL LOW (ref >60)
GFR, EST AFRICAN AMERICAN: 22 mL/min — AB (ref >60)
GLUCOSE: 127 mg/dL — AB (ref 65–99)
Phosphorus: 1.7 mg/dL — ABNORMAL LOW (ref 2.5–4.6)
Potassium: 3.1 mmol/L — ABNORMAL LOW (ref 3.5–5.1)
SODIUM: 132 mmol/L — AB (ref 135–145)

## 2016-05-08 LAB — PROTIME-INR
INR: 1.18
Prothrombin Time: 15.1 seconds (ref 11.4–15.2)

## 2016-05-08 LAB — BASIC METABOLIC PANEL
Anion gap: 9 (ref 5–15)
BUN: 46 mg/dL — ABNORMAL HIGH (ref 6–20)
CALCIUM: 8.3 mg/dL — AB (ref 8.9–10.3)
CO2: 16 mmol/L — AB (ref 22–32)
CREATININE: 12.2 mg/dL — AB (ref 0.61–1.24)
Chloride: 110 mmol/L (ref 101–111)
GFR calc Af Amer: 5 mL/min — ABNORMAL LOW (ref >60)
GFR calc non Af Amer: 4 mL/min — ABNORMAL LOW (ref >60)
GLUCOSE: 81 mg/dL (ref 65–99)
Potassium: 5.4 mmol/L — ABNORMAL HIGH (ref 3.5–5.1)
Sodium: 135 mmol/L (ref 135–145)

## 2016-05-08 LAB — GLUCOSE, CAPILLARY
GLUCOSE-CAPILLARY: 82 mg/dL (ref 65–99)
Glucose-Capillary: 103 mg/dL — ABNORMAL HIGH (ref 65–99)
Glucose-Capillary: 116 mg/dL — ABNORMAL HIGH (ref 65–99)

## 2016-05-08 LAB — IRON AND TIBC
Iron: 43 ug/dL — ABNORMAL LOW (ref 45–182)
SATURATION RATIOS: 23 % (ref 17.9–39.5)
TIBC: 186 ug/dL — ABNORMAL LOW (ref 250–450)
UIBC: 143 ug/dL

## 2016-05-08 LAB — RETICULOCYTES
RBC.: 2.3 MIL/uL — AB (ref 4.22–5.81)
RETIC COUNT ABSOLUTE: 36.8 10*3/uL (ref 19.0–186.0)
Retic Ct Pct: 1.6 % (ref 0.4–3.1)

## 2016-05-08 LAB — APTT: APTT: 50 s — AB (ref 24–36)

## 2016-05-08 LAB — FERRITIN: Ferritin: 631 ng/mL — ABNORMAL HIGH (ref 24–336)

## 2016-05-08 LAB — PREPARE RBC (CROSSMATCH)

## 2016-05-08 MED ORDER — ALTEPLASE 2 MG IJ SOLR: 2.0000 mg | Freq: Once | INTRAMUSCULAR | Status: DC | PRN

## 2016-05-08 MED ORDER — PANTOPRAZOLE SODIUM 40 MG PO TBEC
40.0000 mg | DELAYED_RELEASE_TABLET | Freq: Every day | ORAL | Status: DC
  Administered 2016-05-09 – 2016-05-10 (×2): 40 mg via ORAL
  Filled 2016-05-08 (×2): qty 1

## 2016-05-08 MED ORDER — ONDANSETRON HCL 4 MG PO TABS
4.0000 mg | ORAL_TABLET | Freq: Four times a day (QID) | ORAL | Status: DC | PRN
  Administered 2016-05-10: 4 mg via ORAL
  Filled 2016-05-08: qty 1

## 2016-05-08 MED ORDER — TENOFOVIR DISOPROXIL FUMARATE 300 MG PO TABS: 300.0000 mg | ORAL_TABLET | ORAL | Status: DC

## 2016-05-08 MED ORDER — ONDANSETRON HCL 4 MG/2ML IJ SOLN
4.0000 mg | Freq: Four times a day (QID) | INTRAMUSCULAR | Status: DC | PRN
  Administered 2016-05-09 – 2016-05-10 (×2): 4 mg via INTRAVENOUS
  Filled 2016-05-08: qty 2

## 2016-05-08 MED ORDER — ACETAMINOPHEN 325 MG PO TABS: 650.0000 mg | ORAL_TABLET | Freq: Four times a day (QID) | ORAL | Status: DC | PRN

## 2016-05-08 MED ORDER — MIDODRINE HCL 5 MG PO TABS
ORAL_TABLET | ORAL | Status: AC
  Filled 2016-05-08: qty 2

## 2016-05-08 MED ORDER — DARBEPOETIN ALFA 60 MCG/0.3ML IJ SOSY
60.0000 ug | PREFILLED_SYRINGE | INTRAMUSCULAR | Status: DC
  Administered 2016-05-09: 60 ug via INTRAVENOUS
  Filled 2016-05-08: qty 0.3

## 2016-05-08 MED ORDER — MIDODRINE HCL 5 MG PO TABS
10.0000 mg | ORAL_TABLET | Freq: Three times a day (TID) | ORAL | Status: DC
  Administered 2016-05-08 – 2016-05-10 (×5): 10 mg via ORAL
  Filled 2016-05-08 (×3): qty 2

## 2016-05-08 MED ORDER — SODIUM CHLORIDE 0.9 % IV SOLN: Freq: Once | INTRAVENOUS | Status: DC

## 2016-05-08 MED ORDER — LANTHANUM CARBONATE 500 MG PO CHEW
500.0000 mg | CHEWABLE_TABLET | Freq: Three times a day (TID) | ORAL | Status: DC
  Administered 2016-05-09 (×2): 500 mg via ORAL
  Filled 2016-05-08 (×2): qty 1

## 2016-05-08 MED ORDER — TENOFOVIR DISOPROXIL FUMARATE 300 MG PO TABS
300.0000 mg | ORAL_TABLET | Freq: Every day | ORAL | Status: DC
  Filled 2016-05-08: qty 1

## 2016-05-08 MED ORDER — HEPARIN SODIUM (PORCINE) 1000 UNIT/ML DIALYSIS: 1000.0000 [IU] | INTRAMUSCULAR | Status: DC | PRN

## 2016-05-08 MED ORDER — DOLUTEGRAVIR SODIUM 50 MG PO TABS
50.0000 mg | ORAL_TABLET | Freq: Every day | ORAL | Status: DC
  Administered 2016-05-09 – 2016-05-10 (×2): 50 mg via ORAL
  Filled 2016-05-08 (×2): qty 1

## 2016-05-08 MED ORDER — SEVELAMER CARBONATE 800 MG PO TABS
800.0000 mg | ORAL_TABLET | Freq: Three times a day (TID) | ORAL | Status: DC
  Administered 2016-05-09 (×2): 800 mg via ORAL
  Filled 2016-05-08 (×2): qty 1

## 2016-05-08 MED ORDER — SODIUM CHLORIDE 0.9 % IV SOLN: 100.0000 mL | INTRAVENOUS | Status: DC | PRN

## 2016-05-08 MED ORDER — TRAMADOL HCL 50 MG PO TABS
50.0000 mg | ORAL_TABLET | Freq: Two times a day (BID) | ORAL | Status: DC | PRN
  Administered 2016-05-08: 50 mg via ORAL
  Filled 2016-05-08: qty 1

## 2016-05-08 MED ORDER — ACETAMINOPHEN 650 MG RE SUPP: 650.0000 mg | Freq: Four times a day (QID) | RECTAL | Status: DC | PRN

## 2016-05-08 MED ORDER — LAMIVUDINE 10 MG/ML PO SOLN
25.0000 mg | Freq: Every day | ORAL | Status: DC
  Administered 2016-05-09 – 2016-05-10 (×2): 25 mg via ORAL
  Filled 2016-05-08 (×3): qty 5

## 2016-05-08 MED ORDER — INSULIN ASPART 100 UNIT/ML ~~LOC~~ SOLN: 0.0000 [IU] | Freq: Three times a day (TID) | SUBCUTANEOUS | Status: DC
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
## 2016-05-08 NOTE — Progress Notes (Signed)
Paged Family Medicine to advise pt on floor.  Paged back will be putting in orders at this time.
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
## 2016-05-08 NOTE — Discharge Planning (Signed)
EDCM consulted to find how which HD the pt goes to.  Pt is active with [facility] # [id number]. HD patient, please notify [facility] [phone]) when pt ready for discharge.

## 2016-05-08 NOTE — ED Notes (Signed)
Attempted to place pt in a gown and out of jacket to obtain vitals. Pt refused stated it was to cold for him.

## 2016-05-08 NOTE — Progress Notes (Signed)
Pt gone to dialysis via bed.   

## 2016-05-08 NOTE — ED Triage Notes (Signed)
Pt. Coming from home via [facility] for dialysis. Pt. [facility] center called EMS today because pt. Has not been dialyzed since 12-21. Pt.  Here yesterday for clotted fistula and was given a new hemodialysis catheter. Pt. Has no complaints at this time. Pt. Refusing gown.

## 2016-05-08 NOTE — Progress Notes (Signed)
Pt admitted to room 6E09.  Pt alert and oriented.  VSS. Denies pain. Pt to go to dialysis. Report has been called.  Tele # [phone]. SR [phone]

## 2016-05-09 ENCOUNTER — Encounter (HOSPITAL_COMMUNITY)
Admission: EM | Disposition: A | Discharge status: Home / Self Care | Payer: Self-pay | Source: Home / Self Care | Attending: Family Medicine

## 2016-05-09 ENCOUNTER — Encounter (HOSPITAL_COMMUNITY): Payer: Self-pay | Admitting: Vascular Surgery

## 2016-05-09 HISTORY — PX: PERIPHERAL VASCULAR CATHETERIZATION: SHX172C

## 2016-05-09 LAB — RENAL FUNCTION PANEL
Albumin: 2 g/dL — ABNORMAL LOW (ref 3.5–5.0)
Anion gap: 8 (ref 5–15)
BUN: 13 mg/dL (ref 6–20)
CALCIUM: 8.2 mg/dL — AB (ref 8.9–10.3)
CHLORIDE: 98 mmol/L — AB (ref 101–111)
CO2: 27 mmol/L (ref 22–32)
Creatinine, Ser: 5 mg/dL — ABNORMAL HIGH (ref 0.61–1.24)
GFR calc non Af Amer: 13 mL/min — ABNORMAL LOW (ref >60)
GFR, EST AFRICAN AMERICAN: 15 mL/min — AB (ref >60)
GLUCOSE: 141 mg/dL — AB (ref 65–99)
Phosphorus: 2.8 mg/dL (ref 2.5–4.6)
Potassium: 4 mmol/L (ref 3.5–5.1)
SODIUM: 133 mmol/L — AB (ref 135–145)

## 2016-05-09 LAB — CBC
HEMATOCRIT: 31.6 % — AB (ref 39.0–52.0)
Hemoglobin: 10.7 g/dL — ABNORMAL LOW (ref 13.0–17.0)
MCH: 30.4 pg (ref 26.0–34.0)
MCHC: 33.9 g/dL (ref 30.0–36.0)
MCV: 89.8 fL (ref 78.0–100.0)
PLATELETS: 177 10*3/uL (ref 150–400)
RBC: 3.52 MIL/uL — ABNORMAL LOW (ref 4.22–5.81)
RDW: 17.4 % — AB (ref 11.5–15.5)
WBC: 7.1 10*3/uL (ref 4.0–10.5)

## 2016-05-09 LAB — TYPE AND SCREEN
Blood Product Expiration Date: 201801022359
Blood Product Expiration Date: 201801032359
ISSUE DATE / TIME: 201712271734
ISSUE DATE / TIME: 201712271734
UNIT TYPE AND RH: 5100
Unit Type and Rh: 5100

## 2016-05-09 LAB — MRSA PCR SCREENING: MRSA BY PCR: NEGATIVE

## 2016-05-09 LAB — GLUCOSE, CAPILLARY
GLUCOSE-CAPILLARY: 112 mg/dL — AB (ref 65–99)
Glucose-Capillary: 90 mg/dL (ref 65–99)

## 2016-05-09 LAB — HEPATITIS B SURFACE ANTIGEN: HEP B S AG: NEGATIVE

## 2016-05-09 SURGERY — A/V FISTULAGRAM
Anesthesia: LOCAL | Laterality: Left

## 2016-05-09 MED ORDER — IODIXANOL 320 MG/ML IV SOLN
INTRAVENOUS | Status: DC | PRN
  Administered 2016-05-09: 50 mL via INTRAVENOUS

## 2016-05-09 MED ORDER — LIDOCAINE HCL (PF) 1 % IJ SOLN
INTRAMUSCULAR | Status: DC | PRN
  Administered 2016-05-09 (×2): 2 mL

## 2016-05-09 MED ORDER — DARBEPOETIN ALFA 60 MCG/0.3ML IJ SOSY
PREFILLED_SYRINGE | INTRAMUSCULAR | Status: AC
  Administered 2016-05-09: 60 ug via INTRAVENOUS
  Filled 2016-05-09: qty 0.3

## 2016-05-09 MED ORDER — MIDODRINE HCL 5 MG PO TABS
ORAL_TABLET | ORAL | Status: AC
  Administered 2016-05-09: 10 mg via ORAL
  Filled 2016-05-09: qty 2

## 2016-05-09 MED ORDER — BOOST / RESOURCE BREEZE PO LIQD
1.0000 | Freq: Two times a day (BID) | ORAL | Status: DC
  Administered 2016-05-09: 1 via ORAL

## 2016-05-09 MED ORDER — OXYCODONE HCL 5 MG PO TABS: 5.0000 mg | ORAL_TABLET | Freq: Four times a day (QID) | ORAL | Status: DC | PRN

## 2016-05-09 MED ORDER — HEPARIN (PORCINE) IN NACL 2-0.9 UNIT/ML-% IJ SOLN
INTRAMUSCULAR | Status: AC
  Filled 2016-05-09: qty 500

## 2016-05-09 MED ORDER — HEPARIN (PORCINE) IN NACL 2-0.9 UNIT/ML-% IJ SOLN
INTRAMUSCULAR | Status: DC | PRN
Start: 2016-05-09 — End: 2016-05-09
  Administered 2016-05-09: 500 mL

## 2016-05-09 MED ORDER — LIDOCAINE HCL (PF) 1 % IJ SOLN
INTRAMUSCULAR | Status: AC
  Filled 2016-05-09: qty 30

## 2016-05-09 SURGICAL SUPPLY — 11 items
BAG SNAP BAND KOVER 36X36 (MISCELLANEOUS) ×2 IMPLANT
COVER DOME SNAP 22 D (MISCELLANEOUS) ×2 IMPLANT
COVER PRB 48X5XTLSCP FOLD TPE (BAG) ×2 IMPLANT
COVER PROBE 5X48 (BAG) ×4
KIT MICROINTRODUCER STIFF 5F (SHEATH) ×2 IMPLANT
PROTECTION STATION PRESSURIZED (MISCELLANEOUS) ×2
STATION PROTECTION PRESSURIZED (MISCELLANEOUS) ×1 IMPLANT
STOPCOCK MORSE 400PSI 3WAY (MISCELLANEOUS) ×2 IMPLANT
TRAY PV CATH (CUSTOM PROCEDURE TRAY) ×2 IMPLANT
TUBING CIL FLEX 10 FLL-RA (TUBING) ×2 IMPLANT
WIRE TORQFLEX AUST .018X40CM (WIRE) ×2 IMPLANT

## 2016-05-09 NOTE — Op Note (Signed)
   PATIENTTuff Daniels  MRN: [id number] DOB: 04-16-1971    DATE OF PROCEDURE: 05/09/2016  INDICATIONS: Alvin Daniels is a 45 y.o. male who has a failed left forearm AV fistula. He presents for evaluation for new access.  PROCEDURE:  1. Cannulation of the left radial artery with brachial arteriogram 2. Cannulation of left cephalic vein with central venogram  SURGEON: Judeth Cornfield. Scot Dock, MD, FACS  ANESTHESIA: Local   EBL: Minimal  TECHNIQUE: The patient was taken to the peripheral vascular lab. The left arm was prepped and draped in usual sterile fashion. The patient had 2 large aneurysms in his forearm fistula and I attempted to cannulate the aneurysm where it was pulsatile. There was good return but the wire would not thread despite multiple attempts. By duplex that was a large amount of laminated clot within both aneurysms. I therefore elected to stick above the 2 aneurysms. Under ultrasound guidance that was superficial vessel which compressed fairly easily without spell was the fistula above the aneurysms. I cannulated this with a micro-puncture needle and introduced a marker puncture sheath. Fistula gram was obtained and it became clear that this was in fact the radial artery. I did evaluate the brachial and subclavian arteries and there was no arterial disease. I then removed the catheter and pressure was held for hemostasis.  Under ultrasound guidance the cephalic vein was cannulated above the aneurysms with a marker puncture needle micropuncture sheath was introduced. A fistulogram was then obtained evaluating the vein from the cannulation site to include the central veins. This catheter was then removed and pressure held for hemostasis. No immediate competitions were noted.   FINDINGS:  1. No significant disease of the left subclavian left brachial arteries. 2. Widely patent left upper arm cephalic vein and basilic vein. 3. No central venous  stenosis.  Deitra Mayo, MD, FACS [facility]  DATE OF DICTATION:   05/09/2016
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
## 2016-05-09 NOTE — H&P (View-Only) (Signed)
  Progress Note    05/09/2016 8:29 AM * No surgery date entered *  Subjective:  No complaints  Vitals:   05/09/16 0730 05/09/16 0800  BP: 107/74 105/70  Pulse: 82 80  Resp: 12 10  Temp:      Physical Exam: aaox3 Left forearm avf palpable pulse  CBC    Component Value Date/Time   WBC 7.1 05/09/2016 0524   RBC 3.52 (L) 05/09/2016 0524   HGB 10.7 (L) 05/09/2016 0524   HCT 31.6 (L) 05/09/2016 0524   PLT 177 05/09/2016 0524   MCV 89.8 05/09/2016 0524   MCH 30.4 05/09/2016 0524   MCHC 33.9 05/09/2016 0524   RDW 17.4 (H) 05/09/2016 0524   LYMPHSABS 1.2 05/08/2016 1009   MONOABS 0.3 05/08/2016 1009   EOSABS 0.2 05/08/2016 1009   BASOSABS 0.1 05/08/2016 1009    BMET    Component Value Date/Time   NA 133 (L) 05/09/2016 0730   K 4.0 05/09/2016 0730   CL 98 (L) 05/09/2016 0730   CO2 27 05/09/2016 0730   GLUCOSE 141 (H) 05/09/2016 0730   BUN 13 05/09/2016 0730   CREATININE 5.00 (H) 05/09/2016 0730   CREATININE 4.44 (H) 10/19/2015 1409   CALCIUM 8.2 (L) 05/09/2016 0730   CALCIUM 8.5 03/12/2010 1250   GFRNONAA 13 (L) 05/09/2016 0730   GFRNONAA 15 (L) 10/19/2015 1409   GFRAA 15 (L) 05/09/2016 0730   GFRAA 17 (L) 10/19/2015 1409    INR    Component Value Date/Time   INR 1.18 05/08/2016 1400     Intake/Output Summary (Last 24 hours) at 05/09/16 0829 Last data filed at 05/09/16 0600  Gross per 24 hour  Intake              695 ml  Output              469 ml  Net              226 ml     Assessment:  45 y.o. male as esrd with malfunctioning left radiocephalic avf  Plan: fistulogram with possible intervention today to evaluate venous runoff for upper fistula on left   [name] C. Donzetta Matters, MD [facility] Office: [phone] Pager: [phone]  05/09/2016 8:29 AM
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
## 2016-05-09 NOTE — Interval H&P Note (Signed)
History and Physical Interval Note:  05/09/2016 1:01 PM  Alvin Daniels  has presented today for surgery, with the diagnosis of end stage renal, HD access of left arm partially clotted  The various methods of treatment have been discussed with the patient and family. After consideration of risks, benefits and other options for treatment, the patient has consented to  Procedure(s): A/V Fistulagram (N/A) as a surgical intervention .  The patient's history has been reviewed, patient examined, no change in status, stable for surgery.  I have reviewed the patient's chart and labs.  Questions were answered to the patient's satisfaction.     Deitra Mayo
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
## 2016-05-09 NOTE — Progress Notes (Signed)
   Reviewed images from Dr. Scot Dock procedure today, patient will need left arm brachio-cephalic avf NPO at midnight for possible procedure tomorrow.  Discussed with patient and he is amenable to proceeding if okay with primary team.   Eda Paschal. Donzetta Matters, MD [facility] [place] Office: [phone] Pager: [phone]

## 2016-05-09 NOTE — Care Management Note (Addendum)
Case Management Note  Patient Details  Name: Alvin Daniels MRN: [id number] Date of Birth: [birth year]  Subjective/Objective:           CM following for progression and d/c planning.         Action/Plan: 05/09/2016 Noted order for [facility], however no skilled needs identified, therefore will be unable to provide this services. Dressing is simple exterior dressing change at this time. MD notified that no skilled needs have been identified.  05/10/2016 Message received from resident,requesting dressing supplies if [facility] is not needed or appropriate. This CM will ask pt RN to provide some dressing supplies at the time of d/c.   Expected Discharge Date:  05/10/2016            Expected Discharge Plan:  Home/Self Care  In-House Referral:  NA  Discharge planning Services  CM Consult  Post Acute Care Choice:  NA Choice offered to:  NA  DME Arranged:    DME Agency:     HH Arranged:    HH Agency:     Status of Service:  Completed, signed off  If discussed at [employer] of Stay Meetings, dates discussed:    Additional Comments:  Adron Bene, RN 05/09/2016, 2:02 PM
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
## 2016-05-10 ENCOUNTER — Inpatient Hospital Stay (HOSPITAL_COMMUNITY): Payer: Self-pay | Admitting: Certified Registered Nurse Anesthetist

## 2016-05-10 ENCOUNTER — Encounter (HOSPITAL_COMMUNITY): Payer: Self-pay | Admitting: Certified Registered Nurse Anesthetist

## 2016-05-10 ENCOUNTER — Encounter (HOSPITAL_COMMUNITY)
Admission: EM | Disposition: A | Discharge status: Home / Self Care | Payer: Self-pay | Source: Home / Self Care | Attending: Family Medicine

## 2016-05-10 HISTORY — PX: AV FISTULA PLACEMENT: SHX1204

## 2016-05-10 LAB — GLUCOSE, CAPILLARY
GLUCOSE-CAPILLARY: 92 mg/dL (ref 65–99)
GLUCOSE-CAPILLARY: 95 mg/dL (ref 65–99)
GLUCOSE-CAPILLARY: 97 mg/dL (ref 65–99)
Glucose-Capillary: 122 mg/dL — ABNORMAL HIGH (ref 65–99)
Glucose-Capillary: 86 mg/dL (ref 65–99)
Glucose-Capillary: 93 mg/dL (ref 65–99)

## 2016-05-10 LAB — SURGICAL PCR SCREEN
MRSA, PCR: NEGATIVE
STAPHYLOCOCCUS AUREUS: NEGATIVE

## 2016-05-10 LAB — CBC
HEMATOCRIT: 34.8 % — AB (ref 39.0–52.0)
HEMOGLOBIN: 11.6 g/dL — AB (ref 13.0–17.0)
MCH: 30.4 pg (ref 26.0–34.0)
MCHC: 33.3 g/dL (ref 30.0–36.0)
MCV: 91.3 fL (ref 78.0–100.0)
Platelets: 197 10*3/uL (ref 150–400)
RBC: 3.81 MIL/uL — ABNORMAL LOW (ref 4.22–5.81)
RDW: 17.2 % — ABNORMAL HIGH (ref 11.5–15.5)
WBC: 7.4 10*3/uL (ref 4.0–10.5)

## 2016-05-10 SURGERY — ARTERIOVENOUS (AV) FISTULA CREATION
Anesthesia: General | Site: Arm Lower | Laterality: Left

## 2016-05-10 MED ORDER — FENTANYL CITRATE (PF) 100 MCG/2ML IJ SOLN: 25.0000 ug | INTRAMUSCULAR | Status: DC | PRN

## 2016-05-10 MED ORDER — OXYCODONE HCL 5 MG/5ML PO SOLN: 5.0000 mg | Freq: Once | ORAL | Status: DC | PRN

## 2016-05-10 MED ORDER — SODIUM CHLORIDE 0.9 % IV SOLN
INTRAVENOUS | Status: DC
  Administered 2016-05-10 (×2): via INTRAVENOUS

## 2016-05-10 MED ORDER — MIDAZOLAM HCL 2 MG/2ML IJ SOLN
INTRAMUSCULAR | Status: AC
  Filled 2016-05-10: qty 2

## 2016-05-10 MED ORDER — PHENYLEPHRINE 40 MCG/ML (10ML) SYRINGE FOR IV PUSH (FOR BLOOD PRESSURE SUPPORT)
PREFILLED_SYRINGE | INTRAVENOUS | Status: AC
  Filled 2016-05-10: qty 10

## 2016-05-10 MED ORDER — FENTANYL CITRATE (PF) 100 MCG/2ML IJ SOLN
INTRAMUSCULAR | Status: DC | PRN
  Administered 2016-05-10 (×2): 50 ug via INTRAVENOUS

## 2016-05-10 MED ORDER — PROPOFOL 10 MG/ML IV BOLUS
INTRAVENOUS | Status: DC | PRN
  Administered 2016-05-10: 100 mg via INTRAVENOUS
  Administered 2016-05-10 (×2): 30 mg via INTRAVENOUS

## 2016-05-10 MED ORDER — SODIUM CHLORIDE 0.9 % IV SOLN
INTRAVENOUS | Status: DC | PRN
  Administered 2016-05-10: 500 mL

## 2016-05-10 MED ORDER — ONDANSETRON HCL 4 MG/2ML IJ SOLN
INTRAMUSCULAR | Status: AC
  Filled 2016-05-10: qty 2

## 2016-05-10 MED ORDER — ONDANSETRON HCL 4 MG/2ML IJ SOLN: 4.0000 mg | Freq: Once | INTRAMUSCULAR | Status: DC | PRN

## 2016-05-10 MED ORDER — HEPARIN SODIUM (PORCINE) 1000 UNIT/ML IJ SOLN
INTRAMUSCULAR | Status: DC | PRN
  Administered 2016-05-10: 4000 [IU] via INTRAVENOUS

## 2016-05-10 MED ORDER — CEFAZOLIN SODIUM 1 G IJ SOLR
INTRAMUSCULAR | Status: AC
  Filled 2016-05-10: qty 10

## 2016-05-10 MED ORDER — PHENYLEPHRINE HCL 10 MG/ML IJ SOLN
INTRAMUSCULAR | Status: DC | PRN
  Administered 2016-05-10 (×4): 80 ug via INTRAVENOUS

## 2016-05-10 MED ORDER — MIDAZOLAM HCL 2 MG/2ML IJ SOLN
INTRAMUSCULAR | Status: DC | PRN
  Administered 2016-05-10: 2 mg via INTRAVENOUS

## 2016-05-10 MED ORDER — 0.9 % SODIUM CHLORIDE (POUR BTL) OPTIME
TOPICAL | Status: DC | PRN
  Administered 2016-05-10: 1000 mL

## 2016-05-10 MED ORDER — OXYCODONE HCL 5 MG PO TABS: 5.0000 mg | ORAL_TABLET | Freq: Once | ORAL | Status: DC | PRN

## 2016-05-10 MED ORDER — FENTANYL CITRATE (PF) 100 MCG/2ML IJ SOLN
INTRAMUSCULAR | Status: AC
  Filled 2016-05-10: qty 2

## 2016-05-10 MED ORDER — CEFAZOLIN IN D5W 1 GM/50ML IV SOLN
INTRAVENOUS | Status: DC | PRN
  Administered 2016-05-10: 1 g via INTRAVENOUS

## 2016-05-10 MED ORDER — LIDOCAINE 2% (20 MG/ML) 5 ML SYRINGE
INTRAMUSCULAR | Status: AC
  Filled 2016-05-10: qty 5

## 2016-05-10 MED ORDER — PROTAMINE SULFATE 10 MG/ML IV SOLN
INTRAVENOUS | Status: AC
  Filled 2016-05-10: qty 5

## 2016-05-10 MED ORDER — PROTAMINE SULFATE 10 MG/ML IV SOLN
INTRAVENOUS | Status: DC | PRN
  Administered 2016-05-10 (×2): 15 mg via INTRAVENOUS

## 2016-05-10 SURGICAL SUPPLY — 32 items
ADH SKN CLS APL DERMABOND .7 (GAUZE/BANDAGES/DRESSINGS) ×1
ARMBAND PINK RESTRICT EXTREMIT (MISCELLANEOUS) ×2 IMPLANT
CANISTER SUCTION 2500CC (MISCELLANEOUS) ×2 IMPLANT
CLIP TI MEDIUM 6 (CLIP) ×2 IMPLANT
CLIP TI WIDE RED SMALL 6 (CLIP) ×2 IMPLANT
COVER PROBE W GEL 5X96 (DRAPES) IMPLANT
DERMABOND ADVANCED (GAUZE/BANDAGES/DRESSINGS) ×1
DERMABOND ADVANCED .7 DNX12 (GAUZE/BANDAGES/DRESSINGS) ×1 IMPLANT
ELECT REM PT RETURN 9FT ADLT (ELECTROSURGICAL) ×2
ELECTRODE REM PT RTRN 9FT ADLT (ELECTROSURGICAL) ×1 IMPLANT
GLOVE BIO SURGEON STRL SZ 6 (GLOVE) ×1 IMPLANT
GLOVE BIO SURGEON STRL SZ 6.5 (GLOVE) ×1 IMPLANT
GLOVE BIO SURGEON STRL SZ7.5 (GLOVE) ×2 IMPLANT
GLOVE BIOGEL PI IND STRL 6.5 (GLOVE) IMPLANT
GLOVE BIOGEL PI IND STRL 7.5 (GLOVE) ×1 IMPLANT
GLOVE BIOGEL PI INDICATOR 6.5 (GLOVE) ×3
GLOVE BIOGEL PI INDICATOR 7.5 (GLOVE) ×1
GOWN STRL REUS W/ TWL LRG LVL3 (GOWN DISPOSABLE) ×2 IMPLANT
GOWN STRL REUS W/ TWL XL LVL3 (GOWN DISPOSABLE) ×1 IMPLANT
GOWN STRL REUS W/TWL LRG LVL3 (GOWN DISPOSABLE) ×4
GOWN STRL REUS W/TWL XL LVL3 (GOWN DISPOSABLE) ×2
KIT BASIN OR (CUSTOM PROCEDURE TRAY) ×2 IMPLANT
KIT ROOM TURNOVER OR (KITS) ×2 IMPLANT
NS IRRIG 1000ML POUR BTL (IV SOLUTION) ×2 IMPLANT
PACK CV ACCESS (CUSTOM PROCEDURE TRAY) ×2 IMPLANT
PAD ARMBOARD 7.5X6 YLW CONV (MISCELLANEOUS) ×4 IMPLANT
SUT MNCRL AB 4-0 PS2 18 (SUTURE) ×2 IMPLANT
SUT PROLENE 6 0 BV (SUTURE) ×2 IMPLANT
SUT VIC AB 3-0 SH 27 (SUTURE) ×2
SUT VIC AB 3-0 SH 27X BRD (SUTURE) ×1 IMPLANT
UNDERPAD 30X30 (UNDERPADS AND DIAPERS) ×2 IMPLANT
WATER STERILE IRR 1000ML POUR (IV SOLUTION) ×2 IMPLANT
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
## 2016-05-10 NOTE — Progress Notes (Signed)
Paged MD to get order for consent, OR called for patient, made RN, Janett Billow, aware that consent has not been signed.

## 2016-05-10 NOTE — Progress Notes (Signed)
  Progress Note    05/10/2016 8:41 AM 1 Day Post-Op  Subjective:  No complaints today  Vitals:   05/09/16 2205 05/10/16 0449  BP: 131/89 104/75  Pulse: 91 88  Resp: 17 18  Temp: 98.9 F (37.2 C) 98.7 F (37.1 C)    Physical Exam: aaox3 Left palpable radial pulse  CBC    Component Value Date/Time   WBC 7.1 05/09/2016 0524   RBC 3.52 (L) 05/09/2016 0524   HGB 10.7 (L) 05/09/2016 0524   HCT 31.6 (L) 05/09/2016 0524   PLT 177 05/09/2016 0524   MCV 89.8 05/09/2016 0524   MCH 30.4 05/09/2016 0524   MCHC 33.9 05/09/2016 0524   RDW 17.4 (H) 05/09/2016 0524   LYMPHSABS 1.2 05/08/2016 1009   MONOABS 0.3 05/08/2016 1009   EOSABS 0.2 05/08/2016 1009   BASOSABS 0.1 05/08/2016 1009    BMET    Component Value Date/Time   NA 133 (L) 05/09/2016 0730   K 4.0 05/09/2016 0730   CL 98 (L) 05/09/2016 0730   CO2 27 05/09/2016 0730   GLUCOSE 141 (H) 05/09/2016 0730   BUN 13 05/09/2016 0730   CREATININE 5.00 (H) 05/09/2016 0730   CREATININE 4.44 (H) 10/19/2015 1409   CALCIUM 8.2 (L) 05/09/2016 0730   CALCIUM 8.5 03/12/2010 1250   GFRNONAA 13 (L) 05/09/2016 0730   GFRNONAA 15 (L) 10/19/2015 1409   GFRAA 15 (L) 05/09/2016 0730   GFRAA 17 (L) 10/19/2015 1409    INR    Component Value Date/Time   INR 1.18 05/08/2016 1400     Intake/Output Summary (Last 24 hours) at 05/10/16 0841 Last data filed at 05/10/16 0600  Gross per 24 hour  Intake              360 ml  Output              310 ml  Net               50 ml     Assessment:  45 y.o. male with esrd, non funcitioning left radiocephalic avf with suitable vein for upper arm avf conversion.  Plan: OR today for left upper arm av fistula Can be discharged following  Alvin Daniels C. Donzetta Matters, MD [facility] [place] Office: [phone] Pager: [phone]  05/10/2016 8:41 AM

## 2016-05-10 NOTE — Interval H&P Note (Signed)
History and Physical Interval Note:  05/10/2016 1:08 PM  Alvin Daniels  has presented today for surgery, with the diagnosis of End Stage Renal Disease  The various methods of treatment have been discussed with the patient and family. After consideration of risks, benefits and other options for treatment, the patient has consented to  Procedure(s): ARTERIOVENOUS (AV) FISTULA CREATION (Left) as a surgical intervention .  The patient's history has been reviewed, patient examined, no change in status, stable for surgery.  I have reviewed the patient's chart and labs.  Questions were answered to the patient's satisfaction.     Deitra Mayo

## 2016-05-10 NOTE — Discharge Instructions (Signed)
You were admitted to the hospital for anemia and received 2 units of blood while you were here.  In addition, you had a new AV fistula placed for your end-stage-renal-disease.  You were cleared by surgery to be discharged home in stable condition.  Please make an appointment with your PCP within 2 days for a hospital follow-up visit.

## 2016-05-10 NOTE — Op Note (Signed)
    NAME: Alvin Daniels  MRN: [id number] DOB: 05-09-1971    DATE OF OPERATION: 05/10/2016  PREOP DIAGNOSIS: End-stage renal disease  POSTOP DIAGNOSIS: Same  PROCEDURE:  1. Creation of left brachiocephalic AV fistula 2. Ligation of left radial cephalic AV fistula  SURGEON: Judeth Cornfield. Scot Dock, MD, FACS  ASSIST: Lillia Mountain PA  ANESTHESIA: Gen.   EBL: Minimal  INDICATIONS: [name] is a 45 y.o. male who has a nonfunctional left forearm fistula which is markedly degenerative. He presents for placement of a new upper arm fistula and ligation of his forearm fistula.  FINDINGS: Excellent thrill at the completion of the procedure with a palpable left radial pulse.  TECHNIQUE: The patient was taken to the operating room and received a general anesthetic. The left upper extremity was prepped and draped in usual sterile fashion. A transverse incision was made just above the antecubital level. Here the cephalic vein was dissected free area and it was ligated distally and irrigated up nicely with heparin saline. The brachial artery was dissected free beneath the fascia. The patient was heparinized. The brachial artery was clamped proximally and distally and longitudinal arteriotomy was made. The vein was sewn end-to-side to the artery using continuous 6-0 Prolene suture. There was an excellent thrill in the fistula. One small competing branch was ligated. A separate incision was made over the anastomosis at the wrist and here the vein was dissected free and ligated. There was still a good pulse at completion. This incision was closed with 4-0 Vicryl. The heparin was reversed with protamine. Antecubital incisions close the deep layer thrill on the skin closed with 4-0 Vicryl. Liquid band was applied. The patient tolerated the procedure well and was transferred to the recovery room in stable condition. All needle and sponge counts were correct.  Deitra Mayo, MD,  FACS [facility]  DATE OF DICTATION:   05/10/2016

## 2016-05-10 NOTE — H&P (View-Only) (Signed)
  Progress Note    05/10/2016 8:41 AM 1 Day Post-Op  Subjective:  No complaints today  Vitals:   05/09/16 2205 05/10/16 0449  BP: 131/89 104/75  Pulse: 91 88  Resp: 17 18  Temp: 98.9 F (37.2 C) 98.7 F (37.1 C)    Physical Exam: aaox3 Left palpable radial pulse  CBC    Component Value Date/Time   WBC 7.1 05/09/2016 0524   RBC 3.52 (L) 05/09/2016 0524   HGB 10.7 (L) 05/09/2016 0524   HCT 31.6 (L) 05/09/2016 0524   PLT 177 05/09/2016 0524   MCV 89.8 05/09/2016 0524   MCH 30.4 05/09/2016 0524   MCHC 33.9 05/09/2016 0524   RDW 17.4 (H) 05/09/2016 0524   LYMPHSABS 1.2 05/08/2016 1009   MONOABS 0.3 05/08/2016 1009   EOSABS 0.2 05/08/2016 1009   BASOSABS 0.1 05/08/2016 1009    BMET    Component Value Date/Time   NA 133 (L) 05/09/2016 0730   K 4.0 05/09/2016 0730   CL 98 (L) 05/09/2016 0730   CO2 27 05/09/2016 0730   GLUCOSE 141 (H) 05/09/2016 0730   BUN 13 05/09/2016 0730   CREATININE 5.00 (H) 05/09/2016 0730   CREATININE 4.44 (H) 10/19/2015 1409   CALCIUM 8.2 (L) 05/09/2016 0730   CALCIUM 8.5 03/12/2010 1250   GFRNONAA 13 (L) 05/09/2016 0730   GFRNONAA 15 (L) 10/19/2015 1409   GFRAA 15 (L) 05/09/2016 0730   GFRAA 17 (L) 10/19/2015 1409    INR    Component Value Date/Time   INR 1.18 05/08/2016 1400     Intake/Output Summary (Last 24 hours) at 05/10/16 0841 Last data filed at 05/10/16 0600  Gross per 24 hour  Intake              360 ml  Output              310 ml  Net               50 ml     Assessment:  45 y.o. male with esrd, non funcitioning left radiocephalic avf with suitable vein for upper arm avf conversion.  Plan: OR today for left upper arm av fistula Can be discharged following  Serai Tukes C. Donzetta Matters, MD [facility] [place] Office: [phone] Pager: [phone]  05/10/2016 8:41 AM

## 2016-05-10 NOTE — Transfer of Care (Signed)
Immediate Anesthesia Transfer of Care Note  Patient: Alvin Daniels  Procedure(s) Performed: Procedure(s): Creation Left Arm Brachiocephalic Arteriovenous Fistula and Ligation of Radiocephalic Fistula (Left)  Patient Location: PACU  Anesthesia Type:General  Level of Consciousness: awake, alert  and oriented  Airway & Oxygen Therapy: Patient Spontanous Breathing and Patient connected to nasal cannula oxygen  Post-op Assessment: Report given to RN, Post -op Vital signs reviewed and stable and Patient moving all extremities X 4  Post vital signs: Reviewed and stable  Last Vitals:  Vitals:   05/10/16 0449 05/10/16 1047  BP: 104/75 126/72  Pulse: 88 86  Resp: 18 18  Temp: 37.1 C 37.1 C    Last Pain:  Vitals:   05/10/16 1047  TempSrc: Oral  PainSc:          Complications: No apparent anesthesia complications

## 2016-05-10 NOTE — Anesthesia Preprocedure Evaluation (Addendum)
Anesthesia Evaluation  Patient identified by MRN, date of birth, ID band Patient awake    Reviewed: Allergy & Precautions, NPO status , Patient's Chart, lab work & pertinent test results  Airway Mallampati: II  TM Distance: >3 FB Neck ROM: Full    Dental  (+) Dental Advisory Given, Teeth Intact   Pulmonary    breath sounds clear to auscultation       Cardiovascular + Peripheral Vascular Disease   Rhythm:Regular Rate:Normal     Neuro/Psych    GI/Hepatic (+) Hepatitis -, C  Endo/Other  diabetes, Type 2  Renal/GU ESRF and DialysisRenal disease     Musculoskeletal   Abdominal   Peds  Hematology  (+) HIV,   Anesthesia Other Findings   Reproductive/Obstetrics                          Anesthesia Physical Anesthesia Plan  ASA: III  Anesthesia Plan: General   Post-op Pain Management:    Induction: Intravenous  Airway Management Planned: LMA  Additional Equipment:   Intra-op Plan:   Post-operative Plan:   Informed Consent: I have reviewed the patients History and Physical, chart, labs and discussed the procedure including the risks, benefits and alternatives for the proposed anesthesia with the patient or authorized representative who has indicated his/her understanding and acceptance.   Dental advisory given  Plan Discussed with: CRNA and Anesthesiologist  Anesthesia Plan Comments:         Anesthesia Quick Evaluation

## 2016-05-10 NOTE — Progress Notes (Signed)
[facility] Progress Note   Dialysis Orders: TTS at [facility] 4 hours, 160 dialyzer, BFR 400/DFRA1.5, EDW 46kg, 2K/2Ca - Heparin 5000 unit bolus q HD - Aranesp 4mcg IV q weekly (last given 12/21) - Hectoral 47mcg IV q HD  Assessment/Plan: 1. Acess issue - recently failed L forearm fistula, now s/p f'gram yesterday - for new upper left access today 2. ESRD - TTS- HD tomorrow if still here 3. Anemia - hgb up to10.7 s/p 2 units 12/27- Aranesp 60 given 12/28 tsat 23% and ferritin 600s pre transfusion- review of outpatient labs/ESA dosing shows he had been off a fairly standard dose of Aranesp 25-40  q 2 weeks since 10/28 with  hgb in the 9 - 10 range= tsat was 23% and wasn't given Fe because ferritin was in the 1200s- etiology for decline likely multifactorial as well as ESA deficit as he was off Aranesp 10/28 - 12/21 4. Secondary hyperparathyroidism - phos low 1.8 - 2.8 - hold fosrenol/renvela- he had binders on his outpt med list but MD note 11/28 said he was not taking ;cont hectorol 5. HTN/volume - BP variable/vol ok-on midodrine for BP support though not clear if he takes it as an outpt - might be why BP  higher here 6.Protein calorie malnutrition - npo for test - underweight/ alb low 7. HIV - current meds  Myriam Jacobson, PA-C [facility] Beeper [phone] 05/10/2016,8:55 AM  LOS: 1 day    Pt seen, examined and agree w A/P as above.  Kelly Splinter MD [facility] pager [phone]   05/10/2016, 12:03 PM     Subjective:   Continues to have stomach pain. Doesn't like the food.  Objective Vitals:   05/09/16 1342 05/09/16 1733 05/09/16 2205 05/10/16 0449  BP: (!) 139/93 (!) 156/90 131/89 104/75  Pulse: (!) 0 89 91 88  Resp: (!) 9 16 17 18   Temp:  99.1 F (37.3 C) 98.9 F (37.2 C) 98.7 F (37.1 C)  TempSrc:  Oral Oral Oral  SpO2: 100% 100% 100% 100%  Weight:   47.1 kg (103 lb 13.4 oz)   Height:       Physical Exam General:  NAD Heart: RRR Lungs: no rales Abdomen: soft scaphoid, no sig tenderness on exam Extremities: no edema Dialysis Access: right IJ cath left lower AVF   Additional Objective Labs: Basic Metabolic Panel:  Recent Labs Lab 05/08/16 1009 05/08/16 2128 05/09/16 0730  NA 135 132* 133*  K 5.4* 3.1* 4.0  CL 110 96* 98*  CO2 16* 26 27  GLUCOSE 81 127* 141*  BUN 46* 8 13  CREATININE 12.20* 3.64* 5.00*  CALCIUM 8.3* 7.6* 8.2*  PHOS  --  1.7* 2.8   Liver Function Tests:  Recent Labs Lab 05/08/16 2128 05/09/16 0730  ALBUMIN 2.0* 2.0*   No results for input(s): LIPASE, AMYLASE in the last 168 hours. CBC:  Recent Labs Lab 05/07/16 1140 05/08/16 1009 05/09/16 0524  WBC 5.3 5.0 7.1  NEUTROABS  --  3.2  --   HGB 7.5* 7.5* 10.7*  HCT 23.3* 23.2* 31.6*  MCV 96.7 96.3 89.8  PLT 202 199 177   Blood Culture    Component Value Date/Time   SDES BLOOD RIGHT HAND 03/03/2016 1524   SPECREQUEST BOTTLES DRAWN AEROBIC ONLY 5CC 03/03/2016 1524   CULT NO GROWTH 5 DAYS 03/03/2016 1524   REPTSTATUS 03/08/2016 FINAL 03/03/2016 1524    Cardiac Enzymes: No results for input(s): CKTOTAL, CKMB, CKMBINDEX, TROPONINI in the  last 168 hours. CBG:  Recent Labs Lab 05/07/16 1333 05/08/16 2017 05/09/16 1158 05/09/16 1732 05/10/16 0807  GLUCAP 103* 116* 90 112* 92   Iron Studies:  Recent Labs  05/08/16 1400  IRON 43*  TIBC 186*  FERRITIN 631*   Lab Results  Component Value Date   INR 1.18 05/08/2016   INR 1.06 05/07/2016   INR 1.09 05/01/2016   Studies/Results: Dg Chest 2 View  Result Date: 05/08/2016 CLINICAL DATA:  Dialysis. EXAM: CHEST  2 VIEW COMPARISON:  05/07/2016.  05/01/2016 . FINDINGS: Dialysis catheter noted with tip at the cavoatrial junction. Heart size normal. No focal infiltrate. Mild stable elevation left diaphragm. No pleural effusion or pneumothorax. IMPRESSION: Dialysis catheter noted with tip at cavoatrial junction. No acute cardiopulmonary disease.  Electronically Signed   By: Marcello Moores  Register   On: 05/08/2016 10:04   Medications:  . sodium chloride   Intravenous Once  . sodium chloride   Intravenous Once  . darbepoetin (ARANESP) injection - DIALYSIS  60 mcg Intravenous Q Thu-HD  . dolutegravir  50 mg Oral Daily  . feeding supplement  1 Container Oral BID BM  . insulin aspart  0-15 Units Subcutaneous TID WC  . lamiVUDine  25 mg Oral Daily  . lanthanum  500 mg Oral TID WC  . midodrine  10 mg Oral TID WC  . pantoprazole  40 mg Oral Daily  . sevelamer carbonate  800 mg Oral TID WC  . [START ON 05/12/2016] tenofovir  300 mg Oral Weekly

## 2016-05-10 NOTE — Progress Notes (Signed)
Patient discharged to home, AVS was reviewed with interpreter. IV and tele discontinued, patient confirmed he had all belongings. Patient instructed to get prescriptions for PCP, MD with Family medicine made aware. Patient left floor via wheelchair with staff.

## 2016-05-10 NOTE — Hospital Discharge Follow-Up (Signed)
Scientist, research (physical sciences) and [place]:  Patient known to Alvin Daniels. PCP: Dr. Jarold Song. This Case Manager attempted to meet with patient to remind him of his appointment on 05/14/16 at 0900 at [facility] and of follow-up appointment on 06/05/16 at 0930 with Dr. Jarold Song at [facility]; however, patient not in room. Patient in [place] per nurse tech. Will place appointments on AVS and will continue following patient's clinical progress.

## 2016-05-11 NOTE — Anesthesia Postprocedure Evaluation (Signed)
Anesthesia Post Note  Patient: Abdul Monda  Procedure(s) Performed: Procedure(s) (LRB): Creation Left Arm Brachiocephalic Arteriovenous Fistula and Ligation of Radiocephalic Fistula (Left)  Patient location during evaluation: PACU Anesthesia Type: General Level of consciousness: awake Pain management: pain level controlled Vital Signs Assessment: post-procedure vital signs reviewed and stable Respiratory status: spontaneous breathing, nonlabored ventilation, respiratory function stable and patient connected to nasal cannula oxygen Cardiovascular status: blood pressure returned to baseline and stable Postop Assessment: no signs of nausea or vomiting Anesthetic complications: no       Last Vitals:  Vitals:   05/10/16 1450 05/10/16 1500  BP: 96/67   Pulse: 80 81  Resp: (!) 9 10  Temp:  36.6 C    Last Pain:  Vitals:   05/10/16 1500  TempSrc:   PainSc: 1                  Sybol Morre

## 2016-05-12 ENCOUNTER — Encounter (HOSPITAL_COMMUNITY): Payer: Self-pay | Admitting: Vascular Surgery

## 2016-05-21 ENCOUNTER — Other Ambulatory Visit (HOSPITAL_COMMUNITY): Payer: Self-pay | Admitting: Nephrology

## 2016-05-21 ENCOUNTER — Encounter: Payer: Self-pay | Admitting: Nephrology

## 2016-05-21 ENCOUNTER — Other Ambulatory Visit: Payer: Self-pay | Admitting: General Surgery

## 2016-05-21 DIAGNOSIS — N186 End stage renal disease: Secondary | ICD-10-CM

## 2016-05-22 ENCOUNTER — Ambulatory Visit (HOSPITAL_COMMUNITY)
Admission: RE | Admit: 2016-05-22 | Discharge: 2016-05-22 | Disposition: A | Discharge status: Home / Self Care | Payer: Medicaid Other | Source: Ambulatory Visit | Attending: Nephrology | Admitting: Nephrology

## 2016-05-22 ENCOUNTER — Encounter (HOSPITAL_COMMUNITY): Payer: Self-pay

## 2016-05-22 ENCOUNTER — Encounter (HOSPITAL_BASED_OUTPATIENT_CLINIC_OR_DEPARTMENT_OTHER): Payer: Medicaid Other

## 2016-05-22 ENCOUNTER — Other Ambulatory Visit (HOSPITAL_COMMUNITY): Payer: Self-pay | Admitting: Nephrology

## 2016-05-22 DIAGNOSIS — E1140 Type 2 diabetes mellitus with diabetic neuropathy, unspecified: Secondary | ICD-10-CM | POA: Insufficient documentation

## 2016-05-22 DIAGNOSIS — T82868D Thrombosis of vascular prosthetic devices, implants and grafts, subsequent encounter: Secondary | ICD-10-CM | POA: Insufficient documentation

## 2016-05-22 DIAGNOSIS — E1122 Type 2 diabetes mellitus with diabetic chronic kidney disease: Secondary | ICD-10-CM | POA: Insufficient documentation

## 2016-05-22 DIAGNOSIS — B182 Chronic viral hepatitis C: Secondary | ICD-10-CM | POA: Insufficient documentation

## 2016-05-22 DIAGNOSIS — Z89512 Acquired absence of left leg below knee: Secondary | ICD-10-CM | POA: Insufficient documentation

## 2016-05-22 DIAGNOSIS — Z8614 Personal history of Methicillin resistant Staphylococcus aureus infection: Secondary | ICD-10-CM | POA: Insufficient documentation

## 2016-05-22 DIAGNOSIS — Z992 Dependence on renal dialysis: Secondary | ICD-10-CM | POA: Insufficient documentation

## 2016-05-22 DIAGNOSIS — Z833 Family history of diabetes mellitus: Secondary | ICD-10-CM | POA: Insufficient documentation

## 2016-05-22 DIAGNOSIS — N186 End stage renal disease: Secondary | ICD-10-CM

## 2016-05-22 DIAGNOSIS — Y832 Surgical operation with anastomosis, bypass or graft as the cause of abnormal reaction of the patient, or of later complication, without mention of misadventure at the time of the procedure: Secondary | ICD-10-CM | POA: Insufficient documentation

## 2016-05-22 DIAGNOSIS — B20 Human immunodeficiency virus [HIV] disease: Secondary | ICD-10-CM | POA: Insufficient documentation

## 2016-05-22 HISTORY — PX: IR GENERIC HISTORICAL: IMG1180011

## 2016-05-22 LAB — CBC
HEMATOCRIT: 36.1 % — AB (ref 39.0–52.0)
Hemoglobin: 11.5 g/dL — ABNORMAL LOW (ref 13.0–17.0)
MCH: 30.4 pg (ref 26.0–34.0)
MCHC: 31.9 g/dL (ref 30.0–36.0)
MCV: 95.5 fL (ref 78.0–100.0)
PLATELETS: 223 10*3/uL (ref 150–400)
RBC: 3.78 MIL/uL — AB (ref 4.22–5.81)
RDW: 16.8 % — ABNORMAL HIGH (ref 11.5–15.5)
WBC: 4.4 10*3/uL (ref 4.0–10.5)

## 2016-05-22 LAB — POTASSIUM: Potassium: 4.9 mmol/L (ref 3.5–5.1)

## 2016-05-22 LAB — PROTIME-INR
INR: 1.02
Prothrombin Time: 13.4 seconds (ref 11.4–15.2)

## 2016-05-22 MED ORDER — CEFAZOLIN SODIUM-DEXTROSE 2-4 GM/100ML-% IV SOLN
INTRAVENOUS | Status: AC
  Filled 2016-05-22: qty 100

## 2016-05-22 MED ORDER — CEFAZOLIN SODIUM-DEXTROSE 2-4 GM/100ML-% IV SOLN
2.0000 g | INTRAVENOUS | Status: AC
  Administered 2016-05-22: 2 g via INTRAVENOUS

## 2016-05-22 MED ORDER — FENTANYL CITRATE (PF) 100 MCG/2ML IJ SOLN
INTRAMUSCULAR | Status: AC
  Filled 2016-05-22: qty 2

## 2016-05-22 MED ORDER — HEPARIN SODIUM (PORCINE) 1000 UNIT/ML IJ SOLN
INTRAMUSCULAR | Status: AC
  Filled 2016-05-22: qty 1

## 2016-05-22 MED ORDER — SODIUM CHLORIDE 0.9 % IV SOLN: INTRAVENOUS | Status: DC

## 2016-05-22 MED ORDER — SODIUM CHLORIDE 0.9 % IV SOLN
INTRAVENOUS | Status: AC | PRN
  Administered 2016-05-22: 10 mL/h via INTRAVENOUS

## 2016-05-22 MED ORDER — LIDOCAINE HCL (PF) 1 % IJ SOLN
INTRAMUSCULAR | Status: DC | PRN
  Administered 2016-05-22: 5 mL

## 2016-05-22 MED ORDER — MIDAZOLAM HCL 2 MG/2ML IJ SOLN
INTRAMUSCULAR | Status: AC
  Filled 2016-05-22: qty 2

## 2016-05-22 MED ORDER — LIDOCAINE HCL (PF) 1 % IJ SOLN
INTRAMUSCULAR | Status: AC
  Filled 2016-05-22: qty 30

## 2016-05-22 MED ORDER — GELATIN ABSORBABLE 12-7 MM EX MISC
CUTANEOUS | Status: AC
  Filled 2016-05-22: qty 1

## 2016-05-22 MED ORDER — FENTANYL CITRATE (PF) 100 MCG/2ML IJ SOLN
INTRAMUSCULAR | Status: AC | PRN
  Administered 2016-05-22: 50 ug via INTRAVENOUS

## 2016-05-22 MED ORDER — MIDAZOLAM HCL 2 MG/2ML IJ SOLN
INTRAMUSCULAR | Status: AC | PRN
  Administered 2016-05-22: 1 mg via INTRAVENOUS

## 2016-05-22 NOTE — H&P (Signed)
Chief Complaint: Patient was seen in consultation today for tunneled hemodialysis catheter placement at the request of [place]  Referring Physician(s): Bergman,Martha Dr Roney Jaffe  Supervising Physician: Daryll Brod  Patient Status: [facility] - Out-pt  History of Present Illness: Alvin Daniels is a 46 y.o. male   ESRD Has used left forearm fistula since 2001 Declot procedure 05/01/16 Clotted again 05/07/16 Tunneled catheter was placed 12/26 in IR Catheter was fine until 05/20/16 Pt awoke and catheter was out in laying in his bed.  Now scheduled for replacement in IR  Past Medical History:  Diagnosis Date  . AIDS ([place]) 11/22/2014  . Chronic diarrhea   . Chronic hepatitis C without hepatic coma ([place]) 11/22/2014  . Diabetic neuropathy ([place])   . ESRD (end stage renal disease) on dialysis [facility])    "TTS; don't remember street name" (05/03/2014)  . Hepatitis C   . HIV INFECTION 06/27/2010   Qualifier: Diagnosis of  By: Nickola Major CMA ( [place]), Geni Bers    . Hypotension 06/02/2012  . Metabolic bone disease 07/15/4560  . MRSA infection   . Normocytic anemia 06/17/2012  . Pancreatitis   . Pressure ulcer of BKA stump, stage 2 ([place]) 11/22/2014  . Renal disorder   . Severe protein-calorie malnutrition ([place]) 06/17/2012  . Uncontrolled diabetes mellitus with complications ([place]) 5/63/8937   Annotation: uncontrolled Qualifier: Diagnosis of  By: Nickola Major CMA Deborra Medina), Geni Bers      Past Surgical History:  Procedure Laterality Date  . AMPUTATION Left 04/20/2014   Procedure: 3rd toe amputation, 4th Toe Amputation,  5th Toe Amputation;  Surgeon: Newt Minion, MD;  Location: [place];  Service: Orthopedics;  Laterality: Left;  . AMPUTATION Left 05/02/2014   Procedure: Midfoot Amputation;  Surgeon: Newt Minion, MD;  Location: [place];  Service: Orthopedics;  Laterality: Left;  . AMPUTATION Left 06/17/2014   Procedure: AMPUTATION BELOW KNEE;  Surgeon: Newt Minion, MD;   Location: [place];  Service: Orthopedics;  Laterality: Left;  . AV FISTULA PLACEMENT Left   . AV FISTULA PLACEMENT Left 05/10/2016   Procedure: Creation Left Arm Brachiocephalic Arteriovenous Fistula and Ligation of Radiocephalic Fistula;  Surgeon: Angelia Mould, MD;  Location: [place];  Service: Vascular;  Laterality: Left;  . FOOT AMPUTATION THROUGH ANKLE Left 12/'21/2015   midfoot  . IR GENERIC HISTORICAL Left 05/01/2016   IR THROMBECTOMY AV FISTULA W/THROMBOLYSIS/PTA INC/SHUNT/IMG LEFT 05/01/2016 [name], MD [facility]INTER[facility]  . IR GENERIC HISTORICAL  05/01/2016   IR US GUIDE VASC ACCESS LEFT 05/01/2016 [name], MD [facility]  . IR GENERIC HISTORICAL  05/07/2016   IR FLUORO GUIDE CV LINE RIGHT 05/07/2016 Corrie Mckusick, DO [facility]  . IR GENERIC HISTORICAL  05/07/2016   IR US GUIDE VASC ACCESS RIGHT 05/07/2016 Corrie Mckusick, DO [facility]  . PERIPHERAL VASCULAR CATHETERIZATION Left 05/09/2016   Procedure: A/V Fistulagram;  Surgeon: Angelia Mould, MD;  Location: [place] CV LAB;  Service: Cardiovascular;  Laterality: Left;  arm    Allergies: Patient has no known allergies.  Medications: Prior to Admission medications   Medication Sig Start Date End Date Taking? Authorizing Provider  lamiVUDine (EPIVIR) 10 MG/ML solution Take 2.5 mLs (25 mg total) by mouth daily. 04/04/16  Yes Bonnielee Haff, MD  midodrine (PROAMATINE) 10 MG tablet Take 1 tablet (10 mg total) by mouth 3 (three) times daily with meals. 04/24/16  Yes [name], MD  multivitamin (RENA-VIT) TABS tablet Take 1 tablet by mouth at bedtime. 03/06/16  Yes Marijean Heath, NP  omeprazole (PRILOSEC) 40 MG capsule TAKE 1 CAPSULE BY MOUTH EVERY DAY 04/24/16  Yes [name], MD  oxycodone (OXY-IR) 5 MG capsule Take 5 mg by mouth every 4 (four) hours as needed for pain.   Yes Historical Provider, MD  tenofovir (VIREAD) 300 MG tablet TAKE 1 TABLET BY MOUTH ONCE A WEEK ON SUNDAY 02/23/16  Yes  Truman Hayward, MD  TIVICAY 50 MG tablet TAKE 1 TABLET BY MOUTH EVERY DAY 02/23/16  Yes Truman Hayward, MD  HYDROcodone-acetaminophen [facility]) 5-325 MG tablet Take 1 tablet by mouth every 6 (six) hours as needed. Patient not taking: Reported on 05/22/2016 04/04/16   Bonnielee Haff, MD     Family History  Problem Relation Age of Onset  . Diabetes Mother   . Diabetes Father     Social History   Social History  . Marital status: Single    Spouse name: N/A  . Number of children: N/A  . Years of education: N/A   Social History Main Topics  . Smoking status: Never Smoker  . Smokeless tobacco: Never Used  . Alcohol use No  . Drug use: No  . Sexual activity: No   Other Topics Concern  . None   Social History Narrative   ** Merged History Encounter **         Review of Systems: A 12 point ROS discussed and pertinent positives are indicated in the HPI above.  All other systems are negative.  Review of Systems  Constitutional: Negative for activity change, appetite change and fatigue.       Speaks only Spanish  Respiratory: Negative for shortness of breath.   Cardiovascular: Negative for chest pain.  Gastrointestinal: Negative for abdominal pain.  Neurological: Negative for weakness.  Psychiatric/Behavioral: Negative for behavioral problems and confusion.    Vital Signs: BP (!) 85/61   Pulse 80   Temp 97.9 F (36.6 C) (Oral)   Resp 20   Ht 5' 4"  (1.626 m)   Wt 103 lb (46.7 kg)   SpO2 100%   BMI 17.68 kg/m   Physical Exam  Constitutional: He is oriented to person, place, and time.  Cardiovascular: Normal rate, regular rhythm and normal heart sounds.   Pulmonary/Chest: Effort normal.  Abdominal: Soft. Bowel sounds are normal.  Musculoskeletal: Normal range of motion.  L BKA Clotted Left forearm fistula  Neurological: He is alert and oriented to person, place, and time.  Skin: Skin is warm and dry.  Psychiatric: He has a normal mood and affect. His  behavior is normal. Judgment and thought content normal.  Speaks Spanish Has interpreter at bedside  Nursing note and vitals reviewed.   Mallampati Score:  MD Evaluation Airway: WNL Heart: WNL Abdomen: WNL Chest/ Lungs: WNL ASA  Classification: 3 Mallampati/Airway Score: One  Imaging: Dg Chest 2 View  Result Date: 05/08/2016 CLINICAL DATA:  Dialysis. EXAM: CHEST  2 VIEW COMPARISON:  05/07/2016.  05/01/2016 . FINDINGS: Dialysis catheter noted with tip at the cavoatrial junction. Heart size normal. No focal infiltrate. Mild stable elevation left diaphragm. No pleural effusion or pneumothorax. IMPRESSION: Dialysis catheter noted with tip at cavoatrial junction. No acute cardiopulmonary disease. Electronically Signed   By: Marcello Moores  Register   On: 05/08/2016 10:04   Ir Cyndy Freeze Guide Cv Line Right  Result Date: 05/07/2016 INDICATION: 46 year old male with a history of renal failure. He has had malfunctioning dialysis circuit and is now referred for hemodialysis catheter. EXAM: TUNNELED  CENTRAL VENOUS HEMODIALYSIS CATHETER PLACEMENT WITH ULTRASOUND AND FLUOROSCOPIC GUIDANCE MEDICATIONS: 2.0 g Ancef . The antibiotic was given in an appropriate time interval prior to skin puncture. ANESTHESIA/SEDATION: A total of Versed 0 mg and Fentanyl 75 mcg was administered intravenously. No sedation FLUOROSCOPY TIME:  Fluoroscopy Time: 0 minutes 54 seconds (2.0 mGy). COMPLICATIONS: None PROCEDURE: Informed written consent was obtained from the patient via interpreter after a discussion of the risks, benefits, and alternatives to treatment. Questions regarding the procedure were encouraged and answered. The right neck and chest were prepped with chlorhexidine in a sterile fashion, and a sterile drape was applied covering the operative field. Maximum barrier sterile technique with sterile gowns and gloves were used for the procedure. A timeout was performed prior to the initiation of the procedure. After creating a  small venotomy incision, a micropuncture kit was utilized to access the right internal jugular vein under direct, real-time ultrasound guidance after the overlying soft tissues were anesthetized with 1% lidocaine with epinephrine. Ultrasound image documentation was performed. The microwire was marked to measure appropriate internal catheter length. External tunneled length was estimated. A total tip to cuff length of 19 cm was selected. Skin and subcutaneous tissues of chest wall below the clavicle were generously infiltrated with 1% lidocaine for local anesthesia. A small stab incision was made with 11 blade scalpel. The selected hemodialysis catheter was tunneled in a retrograde fashion from the anterior chest wall to the venotomy incision. A guidewire was advanced to the level of the IVC and the micropuncture sheath was exchanged for a peel-away sheath. The catheter was then placed through the peel-away sheath with tips ultimately positioned within the superior aspect of the right atrium. Final catheter positioning was confirmed and documented with a spot radiographic image. The catheter aspirates and flushes normally. The catheter was flushed with appropriate volume heparin dwells. The catheter exit site was secured with a 0-Prolene retention suture. The venotomy incision was closed Derma bond and sterile dressing. Dressings were applied at the chest wall. Patient tolerated the procedure well and remained hemodynamically stable throughout. No complications were encountered and no significant blood loss encountered. IMPRESSION: Status post right IJ tunneled hemodialysis catheter 19 cm tip to cuff. Catheter ready for use. Signed, Dulcy Fanny. Earleen Newport, DO [facility] Radiology Electronically Signed   By: Corrie Mckusick D.O.   On: 05/07/2016 14:37   Ir US Guide Vasc Access Left  Result Date: 05/01/2016 CLINICAL DATA:  Occluded left forearm AV hemodialysis fistula EXAM:  DIALYSIS FISTULA DECLOT VENOUS ANGIOPLASTY ULTRASOUND GUIDANCE FOR VASCULAR ACCESS FLUOROSCOPY TIME:  2 minutes 42 second, 4 mGy TECHNIQUE: The procedure, risks (including but not limited to bleeding, infection, organ damage ), benefits, and alternatives were explained to the patient with the aid of interpreter. Questions regarding the procedure were encouraged and answered. The patient understands and consents to the procedure. The outflow vein of the fistula just central to the anastomosis was accessed antegrade with 21-gauge micropuncture needle under real-time ultrasonic guidance after the overlying skin prepped with chlorhexidine, draped in usual sterile fashion, infiltrated locally with 1%lidocaine. Needle exchanged over 018guidewire for transitional dilator through which 2 mg t-PA was administered. Ultrasound images were stored. Through the dilator, a Bentson wire was advanced . Over this a 21F sheath was placed, through which a 5 [place] Kumpe catheter was advanced for outflow venography. This showed patency of the outflow cephalic vein above the elbow through the SVC . 3000 units heparin were administered IV. The  Kumpe was exchanged over guide wire for the AngioJet device, used to mechanically thrombolyse the outflow vein and remove the thrombus from the native outflow. Outflow venography showed restoration of flow antegrade. 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 A 7 mm x 4 cm Conquest angioplasty balloon was advanced into the outflow vein. Angioplasty of the venous stenosis was performed. 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 The Angiojet device was again advanced in used to further mechanically thrombolysis the residual thrombus in the outflow vein. Follow-up venography showed further clearance of residual thrombus. There was some recoil at the site of venous  angioplasty so this site was further dilated with an 8 mm Mustang balloon for 60 seconds. Follow-up outflow venogram shows good flow through this segment with no residual or recurrent stenosis. No extravasation or apparent complication of angioplasty. Follow up shuntogram showed good flow through the outflow vein, no extravasation. The catheter and sheath were then removed and hemostasis achieved with 2-0 Ethilon suture anchor. Patient tolerated procedure well. IMPRESSION: 1. Technically successful declot of left forearm native AV hemodialysis fistula. 2. Technically successful balloon angioplasty of venous outflow stenosis. ACCESS: Remains approachable for percutaneous intervention as needed. Electronically Signed   By: Lucrezia Europe M.D.   On: 05/01/2016 13:02   Ir US Guide Vasc Access Right  Result Date: 05/07/2016 INDICATION: 46 year old male with a history of renal failure. He has had malfunctioning dialysis circuit and is now referred for hemodialysis catheter. EXAM: TUNNELED CENTRAL VENOUS HEMODIALYSIS CATHETER PLACEMENT WITH ULTRASOUND AND FLUOROSCOPIC GUIDANCE MEDICATIONS: 2.0 g Ancef . The antibiotic was given in an appropriate time interval prior to skin puncture. ANESTHESIA/SEDATION: A total of Versed 0 mg and Fentanyl 75 mcg was administered intravenously. No sedation FLUOROSCOPY TIME:  Fluoroscopy Time: 0 minutes 54 seconds (2.0 mGy). COMPLICATIONS: None PROCEDURE: Informed written consent was obtained from the patient via interpreter after a discussion of the risks, benefits, and alternatives to treatment. Questions regarding the procedure were encouraged and answered. The right neck and chest were prepped with chlorhexidine in a sterile fashion, and a sterile drape was applied covering the operative field. Maximum barrier sterile technique with sterile gowns and gloves were used for the procedure. A timeout was performed prior to the initiation of the procedure. After creating a small venotomy  incision, a micropuncture kit was utilized to access the right internal jugular vein under direct, real-time ultrasound guidance after the overlying soft tissues were anesthetized with 1% lidocaine with epinephrine. Ultrasound image documentation was performed. The microwire was marked to measure appropriate internal catheter length. External tunneled length was estimated. A total tip to cuff length of 19 cm was selected. Skin and subcutaneous tissues of chest wall below the clavicle were generously infiltrated with 1% lidocaine for local anesthesia. A small stab incision was made with 11 blade scalpel. The selected hemodialysis catheter was tunneled in a retrograde fashion from the anterior chest wall to the venotomy incision. A guidewire was advanced to the level of the IVC and the micropuncture sheath was exchanged for a peel-away sheath. The catheter was then placed through the peel-away sheath with tips ultimately positioned within the superior aspect of the right atrium. Final catheter positioning was confirmed and documented with a spot radiographic image. The catheter aspirates and flushes normally. The catheter was flushed with appropriate volume heparin dwells. The catheter exit site was secured with a 0-Prolene retention  suture. The venotomy incision was closed Derma bond and sterile dressing. Dressings were applied at the chest wall. Patient tolerated the procedure well and remained hemodynamically stable throughout. No complications were encountered and no significant blood loss encountered. IMPRESSION: Status post right IJ tunneled hemodialysis catheter 19 cm tip to cuff. Catheter ready for use. Signed, Dulcy Fanny. Earleen Newport, DO [facility] Radiology Electronically Signed   By: Corrie Mckusick D.O.   On: 05/07/2016 14:37   Ir Thrombectomy Av Fistula W/thrombolysis/pta Inc/shunt/img Left  Result Date: 05/01/2016 CLINICAL DATA:  Occluded left forearm AV  hemodialysis fistula EXAM: DIALYSIS FISTULA DECLOT VENOUS ANGIOPLASTY ULTRASOUND GUIDANCE FOR VASCULAR ACCESS FLUOROSCOPY TIME:  2 minutes 42 second, 4 mGy TECHNIQUE: The procedure, risks (including but not limited to bleeding, infection, organ damage ), benefits, and alternatives were explained to the patient with the aid of interpreter. Questions regarding the procedure were encouraged and answered. The patient understands and consents to the procedure. The outflow vein of the fistula just central to the anastomosis was accessed antegrade with 21-gauge micropuncture needle under real-time ultrasonic guidance after the overlying skin prepped with chlorhexidine, draped in usual sterile fashion, infiltrated locally with 1%lidocaine. Needle exchanged over 018guidewire for transitional dilator through which 2 mg t-PA was administered. Ultrasound images were stored. Through the dilator, a Bentson wire was advanced . Over this a 92F sheath was placed, through which a 5 [place] Kumpe catheter was advanced for outflow venography. This showed patency of the outflow cephalic vein above the elbow through the SVC . 3000 units heparin were administered IV. The Kumpe was exchanged over guide wire for the AngioJet device, used to mechanically thrombolyse the outflow vein and remove the thrombus from the native outflow. Outflow venography showed restoration of flow antegrade. 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 A 7 mm x 4 cm Conquest angioplasty balloon was advanced into the outflow vein. Angioplasty of the venous stenosis was performed. 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 The Angiojet device was again advanced in used to further mechanically thrombolysis the residual thrombus in the outflow vein. Follow-up venography showed further clearance of residual thrombus. There was some recoil at  the site of venous angioplasty so this site was further dilated with an 8 mm Mustang balloon for 60 seconds. Follow-up outflow venogram shows good flow through this segment with no residual or recurrent stenosis. No extravasation or apparent complication of angioplasty. Follow up shuntogram showed good flow through the outflow vein, no extravasation. The catheter and sheath were then removed and hemostasis achieved with 2-0 Ethilon suture anchor. Patient tolerated procedure well. IMPRESSION: 1. Technically successful declot of left forearm native AV hemodialysis fistula. 2. Technically successful balloon angioplasty of venous outflow stenosis. ACCESS: Remains approachable for percutaneous intervention as needed. Electronically Signed   By: Lucrezia [place] M.D.   On: 05/01/2016 13:02    Labs:  CBC:  Recent Labs  05/07/16 1140 05/08/16 1009 05/09/16 0524 05/10/16 0927  WBC 5.3 5.0 7.1 7.4  HGB 7.5* 7.5* 10.7* 11.6*  HCT 23.3* 23.2* 31.6* 34.8*  PLT 202 199 177 197    COAGS:  Recent Labs  03/03/16 1927 05/01/16 0730 05/07/16 1140 05/08/16 1400  INR 1.37 1.09 1.06 1.18  APTT  --  47*  --  50*    BMP:  Recent Labs  05/07/16 1140 05/08/16 1009 05/08/16 2128 05/09/16 0730  NA 138 135 132* 133*  K 4.5 5.4* 3.1* 4.0  CL 108 110 96* 98*  CO2 20* 16* 26 27  GLUCOSE 87 81 127* 141*  BUN 39* 46* 8 13  CALCIUM 8.5* 8.3* 7.6* 8.2*  CREATININE 11.54* 12.20* 3.64* 5.00*  GFRNONAA 5* 4* 19* 13*  GFRAA 5* 5* 22* 15*    LIVER FUNCTION TESTS:  Recent Labs  03/03/16 1927 03/04/16 0616  03/16/16 1216 04/01/16 2336 05/08/16 2128 05/09/16 0730  BILITOT 0.9 1.1  --  0.6 0.5  --   --   AST 23 31  --  23 32  --   --   ALT 10* 10*  --  10* 10*  --   --   ALKPHOS 81 78  --  72 78  --   --   PROT 6.9 6.7  --  6.9 7.7  --   --   ALBUMIN 2.5* 2.4*  < > 2.4* 2.1* 2.0* 2.0*  < > = values in this interval not displayed.  TUMOR MARKERS: No results for input(s): AFPTM, CEA, CA199,  CHROMGRNA in the last 8760 hours.  Assessment and Plan:  Left forearm dialysis fistula thrombolysis procedure 05/01/16 Re clotted 12/26 Tunneled dialysis catheter placed Rt IJ in IR Catheter has come out 05/20/16 Needs replacement Scheduled now for same Risks and Benefits discussed through interpreter-with the patient including, but not limited to bleeding, infection, vascular injury, pneumothorax which may require chest tube placement, air embolism or even death All of the patient's questions were answered, patient is agreeable to proceed. Consent signed and in chart.  [place] bus to [facility] today---has ride home with friend - he will call   Thank you for this interesting consult.  I greatly enjoyed meeting Montrail Kataoka and look forward to participating in their care.  A copy of this report was sent to the requesting provider on this date.  Electronically Signed: Marlis Oldaker A 05/22/2016, 7:46 AM   I spent a total of    25 Minutes in face to face in clinical consultation, greater than 50% of which was counseling/coordinating care for new HD catheter placement

## 2016-05-22 NOTE — Progress Notes (Signed)
Pt is  Alvin Daniels not bilateral

## 2016-05-22 NOTE — Procedures (Signed)
ESRD  S/p RT IJ HD CATH  No comp Stable Tip svcra Ready for use Full report in PACS

## 2016-05-22 NOTE — Discharge Instructions (Signed)
Acceso vascular para hemodilisis (Vascular Access for Hemodialysis) Un acceso vascular es una conexin [employer] vasos sanguneos que permite que la sangre sea fcilmente extrada del organismo y devuelta a este durante la hemodilisis. La hemodilisis es un procedimiento en el que una mquina externa al cuerpo filtra la [place]. Hay tres tipos de accesos vasculares:  Fstula arteriovenosa. Esta una conexin entre una arteria y Ardelia Mems vena (generalmente del brazo) que se realiza al suturarlas juntas. La sangre de la arteria fluye directamente hacia la vena y hace que con el tiempo la vena se agrande. Esto facilita el uso de la vena para la hemodilisis. La fstula arteriovenosa demora de 1 a 21meses en formarse despus de la ciruga.  Injerto arteriovenoso. Es una conexin entre una arteria y Ardelia Mems vena del brazo que se realiza con un tubo. El injerto arteriovenoso puede usarse en el trmino de 2 a 3semanas despus de la [place].  Catter venoso. Es un tubo flexible y delgado que se coloca en una vena principal (habitualmente del cuello, el trax o la ingle). Un catter venoso para hemodilisis consta de dos tubos que salen de la piel. El catter venoso puede usarse inmediatamente. Generalmente, se utiliza como acceso temporario si es necesaria la hemodilisis antes de que se hayan formado una fstula o un injerto. Tambin puede usarse como [employer] si no es posible crear una fstula o un injerto. QU TIPO DE ACCESO ES EL MS ADECUADO PARA M? El tipo de acceso ms adecuado para usted depende del tamao y la fortaleza de sus venas. Generalmente, la fstula es el tipo de acceso preferido. Puede durar varios aos y es menos probable que se infecte o que se formen cogulos en un vaso sanguneo (trombosis) en comparacin con los otros tipos de acceso. Sin embargo, una fstula no es una opcin para todos. Si sus venas no tienen el tamao adecuado, podr usarse un injerto. Los injertos requieren venas  fuertes. Si sus venas no son lo suficientemente fuertes para un injerto, se usar un catter. Hay ms probabilidades de infecciones o de trombosis con los catteres que con las fistulas o los injertos. En [employer], solo un tipo de acceso es la opcin. El mdico lo ayudar a Teacher, adult education qu tipo de acceso es el ms adecuado para usted. CMO SE UTILIZA UN ACCESO VASCULAR? El modo en que el acceso se [place] depende del tipo de acceso:  Si el acceso es una fstula o un injerto, se colocarn dos agujas a travs de la piel dentro del acceso antes de cada sesin de hemodilisis. La [employer] del organismo a travs de una de las agujas y pasa a travs del tubo hacia la mquina de hemodilisis (dializador). Entonces fluye a travs de otro tubo y regresa al organismo a travs de la [place].  Si el acceso es un catter, se conecta un tubo directamente al tubo que lleva al dializador y el otro se conecta a un tubo que sale de [place] aparato. La sangre deja el organismo a travs de un tubo y vuelve al mismo a travs de otro. QU TIPO DE PROBLEMAS PUEDEN [place] CON LOS ACCESOS VASCULARES?  Cogulos de sangre en un vaso sanguneo (trombosis). La trombosis puede causar el estrechamiento de un vaso sanguneo o del tubo (estenosis). Si la trombosis ocurre con frecuencia, se crear otro sitio de acceso como respaldo.  Infeccin. Es ms probable que estos problemas ocurran con un catter venoso y menos probable que se presenten con una fstula arteriovenosa.  CMO DEBO CUIDAR EL ACCESO VASCULAR? Use un brazalete de alerta mdico. En caso de emergencia, esto informar a los mdicos que usted es un paciente en dilisis y as cuidarn sus Scientist, clinical (histocompatibility and immunogenetics). Si tiene un injerto o una fstula:  Un "soplo" es un ruido que se escucha con un estetoscopio, y Ardelia Mems "vibracin" es un temblor que se siente sobre el injerto o la fstula. La presencia de un soplo o vibracin indican que el acceso est funcionando. Se  le indicar que palpe la vibracin [employer]. Si no lo siente, puede haber un cogulo en el acceso. Comunquese con su mdico.  Puede utilizar libremente el brazo donde tiene el acceso vascular despus de que el sitio cicatrice. Tenga en cuenta lo siguiente:  Evite la presin en el brazo.  No levante objetos pesados con el brazo.  No duerma sobre el brazo.  No use camisas con mangas ajustadas ni alhajas alrededor del injerto o la fstula.  No deje que le tomen la presin arterial ni le hagan punciones con agujas del lado donde se encuentran el injerto o la fstula.  Con la autorizacin del mdico, podr hacer ejercicios para favorecer el flujo de sangre a travs de la fstula. Estos ejercicios consisten en apretar una pelota de goma o cualquier otro objeto blando segn las indicaciones. SOLICITE ATENCIN MDICA SI:  Tiene escalofros.  Tiene una temperatura bucal superior a 102F (38,9C).  La hinchazn alrededor del injerto o la fstula empeora.  Siente un [employer].  Observa que hay pus u otro lquido (secrecin) en el lugar del acceso vascular.  Hay enrojecimiento o lneas rojas en la piel alrededor, por encima o por debajo del acceso vascular. SOLICITE ATENCIN MDICA DE INMEDIATO SI:  Siente dolor, entumecimiento o la piel de la mano del lado de la fstula se torna de un color plido fuera de lo comn.  Siente mareos o debilidad que no tuvo antes.  Hay sangrado en el acceso vascular que no puede controlarse fcilmente. Esta informacin no tiene Marine scientist el consejo del mdico. Asegrese de hacerle al mdico cualquier pregunta que tenga. Document Released: 04/15/2012 Document Revised: 05/20/2014 Document Reviewed: 09/15/2012 Elsevier Interactive Patient Education  2017 [employer].

## 2016-05-23 ENCOUNTER — Encounter (HOSPITAL_COMMUNITY): Payer: Self-pay | Admitting: Emergency Medicine

## 2016-05-23 ENCOUNTER — Inpatient Hospital Stay: Payer: Medicaid Other | Admitting: Family Medicine

## 2016-05-23 ENCOUNTER — Observation Stay (HOSPITAL_COMMUNITY)
Admission: EM | Admit: 2016-05-23 | Discharge: 2016-05-25 | Disposition: A | Discharge status: Home / Self Care | Payer: Medicaid Other | Attending: Internal Medicine | Admitting: Internal Medicine

## 2016-05-23 DIAGNOSIS — E1140 Type 2 diabetes mellitus with diabetic neuropathy, unspecified: Secondary | ICD-10-CM | POA: Insufficient documentation

## 2016-05-23 DIAGNOSIS — E1122 Type 2 diabetes mellitus with diabetic chronic kidney disease: Secondary | ICD-10-CM | POA: Diagnosis present

## 2016-05-23 DIAGNOSIS — E162 Hypoglycemia, unspecified: Secondary | ICD-10-CM | POA: Diagnosis present

## 2016-05-23 DIAGNOSIS — B20 Human immunodeficiency virus [HIV] disease: Secondary | ICD-10-CM | POA: Diagnosis present

## 2016-05-23 DIAGNOSIS — E876 Hypokalemia: Secondary | ICD-10-CM | POA: Diagnosis present

## 2016-05-23 DIAGNOSIS — E11649 Type 2 diabetes mellitus with hypoglycemia without coma: Principal | ICD-10-CM | POA: Insufficient documentation

## 2016-05-23 DIAGNOSIS — K219 Gastro-esophageal reflux disease without esophagitis: Secondary | ICD-10-CM | POA: Diagnosis present

## 2016-05-23 DIAGNOSIS — G9340 Encephalopathy, unspecified: Secondary | ICD-10-CM | POA: Diagnosis present

## 2016-05-23 DIAGNOSIS — N186 End stage renal disease: Secondary | ICD-10-CM

## 2016-05-23 DIAGNOSIS — Z992 Dependence on renal dialysis: Secondary | ICD-10-CM | POA: Insufficient documentation

## 2016-05-23 LAB — CBG MONITORING, ED
GLUCOSE-CAPILLARY: 133 mg/dL — AB (ref 65–99)
Glucose-Capillary: 104 mg/dL — ABNORMAL HIGH (ref 65–99)
Glucose-Capillary: 110 mg/dL — ABNORMAL HIGH (ref 65–99)
Glucose-Capillary: 24 mg/dL — CL (ref 65–99)

## 2016-05-23 LAB — CBC
HEMATOCRIT: 33 % — AB (ref 39.0–52.0)
HEMOGLOBIN: 10.6 g/dL — AB (ref 13.0–17.0)
MCH: 30.1 pg (ref 26.0–34.0)
MCHC: 32.1 g/dL (ref 30.0–36.0)
MCV: 93.8 fL (ref 78.0–100.0)
Platelets: 208 10*3/uL (ref 150–400)
RBC: 3.52 MIL/uL — AB (ref 4.22–5.81)
RDW: 16.2 % — ABNORMAL HIGH (ref 11.5–15.5)
WBC: 4.8 10*3/uL (ref 4.0–10.5)

## 2016-05-23 MED ORDER — DEXTROSE 50 % IV SOLN
1.0000 | INTRAVENOUS | Status: DC | PRN
  Filled 2016-05-23: qty 50

## 2016-05-23 MED ORDER — DEXTROSE 50 % IV SOLN
1.0000 | Freq: Once | INTRAVENOUS | Status: AC
  Administered 2016-05-23: 50 mL via INTRAVENOUS
  Filled 2016-05-23: qty 50
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
## 2016-05-23 NOTE — ED Triage Notes (Signed)
Per E[facility] pt from home was at hospital yesterday for hypoglycemia and had another hyperglycemic episode. Initial blood sugar was 36 pt has IV placed and given 250 mL D10, after blood sugar was 144. Pt has chills that started today and is spanish speaking

## 2016-05-23 NOTE — ED Notes (Signed)
Pt given sandwich and OJ per privider

## 2016-05-24 DIAGNOSIS — B20 Human immunodeficiency virus [HIV] disease: Secondary | ICD-10-CM

## 2016-05-24 DIAGNOSIS — E876 Hypokalemia: Secondary | ICD-10-CM | POA: Diagnosis present

## 2016-05-24 DIAGNOSIS — E162 Hypoglycemia, unspecified: Secondary | ICD-10-CM | POA: Diagnosis present

## 2016-05-24 DIAGNOSIS — K219 Gastro-esophageal reflux disease without esophagitis: Secondary | ICD-10-CM | POA: Diagnosis present

## 2016-05-24 DIAGNOSIS — E1122 Type 2 diabetes mellitus with diabetic chronic kidney disease: Secondary | ICD-10-CM

## 2016-05-24 DIAGNOSIS — G9340 Encephalopathy, unspecified: Secondary | ICD-10-CM | POA: Diagnosis present

## 2016-05-24 DIAGNOSIS — R739 Hyperglycemia, unspecified: Secondary | ICD-10-CM | POA: Insufficient documentation

## 2016-05-24 LAB — COMPREHENSIVE METABOLIC PANEL
ALBUMIN: 2.3 g/dL — AB (ref 3.5–5.0)
ALK PHOS: 78 U/L (ref 38–126)
AST: 22 U/L (ref 15–41)
Anion gap: 7 (ref 5–15)
BUN: 5 mg/dL — AB (ref 6–20)
CO2: 30 mmol/L (ref 22–32)
CREATININE: 4.84 mg/dL — AB (ref 0.61–1.24)
Calcium: 8.2 mg/dL — ABNORMAL LOW (ref 8.9–10.3)
Chloride: 98 mmol/L — ABNORMAL LOW (ref 101–111)
GFR calc non Af Amer: 13 mL/min — ABNORMAL LOW (ref >60)
GFR, EST AFRICAN AMERICAN: 15 mL/min — AB (ref >60)
GLUCOSE: 38 mg/dL — AB (ref 65–99)
Potassium: 3.3 mmol/L — ABNORMAL LOW (ref 3.5–5.1)
SODIUM: 135 mmol/L (ref 135–145)
Total Bilirubin: 0.2 mg/dL — ABNORMAL LOW (ref 0.3–1.2)
Total Protein: 7.7 g/dL (ref 6.5–8.1)

## 2016-05-24 LAB — CBG MONITORING, ED
GLUCOSE-CAPILLARY: 76 mg/dL (ref 65–99)
GLUCOSE-CAPILLARY: 139 mg/dL — AB (ref 65–99)
Glucose-Capillary: 109 mg/dL — ABNORMAL HIGH (ref 65–99)
Glucose-Capillary: 130 mg/dL — ABNORMAL HIGH (ref 65–99)
Glucose-Capillary: 170 mg/dL — ABNORMAL HIGH (ref 65–99)
Glucose-Capillary: 63 mg/dL — ABNORMAL LOW (ref 65–99)
Glucose-Capillary: 86 mg/dL (ref 65–99)
Glucose-Capillary: 93 mg/dL (ref 65–99)

## 2016-05-24 LAB — GLUCOSE, CAPILLARY
GLUCOSE-CAPILLARY: 199 mg/dL — AB (ref 65–99)
GLUCOSE-CAPILLARY: 175 mg/dL — AB (ref 65–99)
Glucose-Capillary: 118 mg/dL — ABNORMAL HIGH (ref 65–99)
Glucose-Capillary: 117 mg/dL — ABNORMAL HIGH (ref 65–99)
Glucose-Capillary: 123 mg/dL — ABNORMAL HIGH (ref 65–99)

## 2016-05-24 MED ORDER — DOLUTEGRAVIR SODIUM 50 MG PO TABS
50.0000 mg | ORAL_TABLET | Freq: Every day | ORAL | Status: DC
  Administered 2016-05-24 – 2016-05-25 (×2): 50 mg via ORAL
  Filled 2016-05-24 (×2): qty 1

## 2016-05-24 MED ORDER — MIDODRINE HCL 5 MG PO TABS
10.0000 mg | ORAL_TABLET | Freq: Three times a day (TID) | ORAL | Status: DC
  Administered 2016-05-24 – 2016-05-25 (×5): 10 mg via ORAL
  Filled 2016-05-24 (×6): qty 2

## 2016-05-24 MED ORDER — SODIUM CHLORIDE 0.9% FLUSH
3.0000 mL | Freq: Two times a day (BID) | INTRAVENOUS | Status: DC
  Administered 2016-05-24: 3 mL via INTRAVENOUS

## 2016-05-24 MED ORDER — DEXTROSE-NACL 5-0.9 % IV SOLN
INTRAVENOUS | Status: DC
  Administered 2016-05-24: 50 mL/h via INTRAVENOUS

## 2016-05-24 MED ORDER — ONDANSETRON HCL 4 MG PO TABS: 4.0000 mg | ORAL_TABLET | Freq: Four times a day (QID) | ORAL | Status: DC | PRN

## 2016-05-24 MED ORDER — ACETAMINOPHEN 325 MG PO TABS: 650.0000 mg | ORAL_TABLET | Freq: Four times a day (QID) | ORAL | Status: DC | PRN

## 2016-05-24 MED ORDER — TENOFOVIR DISOPROXIL FUMARATE 300 MG PO TABS: 300.0000 mg | ORAL_TABLET | ORAL | Status: DC

## 2016-05-24 MED ORDER — DEXTROSE 10 % IV SOLN
INTRAVENOUS | Status: DC
  Administered 2016-05-24: 08:00:00 via INTRAVENOUS

## 2016-05-24 MED ORDER — LAMIVUDINE 10 MG/ML PO SOLN
25.0000 mg | Freq: Every day | ORAL | Status: DC
  Administered 2016-05-24 – 2016-05-25 (×2): 25 mg via ORAL
  Filled 2016-05-24 (×2): qty 5

## 2016-05-24 MED ORDER — POTASSIUM CHLORIDE 20 MEQ/15ML (10%) PO SOLN
20.0000 meq | Freq: Once | ORAL | Status: AC
Start: 2016-05-24 — End: 2016-05-24
  Administered 2016-05-24: 20 meq via ORAL
  Filled 2016-05-24: qty 15

## 2016-05-24 MED ORDER — RENA-VITE PO TABS
1.0000 | ORAL_TABLET | Freq: Every day | ORAL | Status: DC
  Administered 2016-05-24: 1 via ORAL
  Filled 2016-05-24: qty 1

## 2016-05-24 MED ORDER — ENOXAPARIN SODIUM 30 MG/0.3ML ~~LOC~~ SOLN
30.0000 mg | SUBCUTANEOUS | Status: DC
  Administered 2016-05-24: 30 mg via SUBCUTANEOUS
  Filled 2016-05-24: qty 0.3

## 2016-05-24 MED ORDER — INSULIN ASPART 100 UNIT/ML ~~LOC~~ SOLN
0.0000 [IU] | SUBCUTANEOUS | Status: DC
  Administered 2016-05-24: 2 [IU] via SUBCUTANEOUS

## 2016-05-24 MED ORDER — ONDANSETRON HCL 4 MG/2ML IJ SOLN: 4.0000 mg | Freq: Four times a day (QID) | INTRAMUSCULAR | Status: DC | PRN

## 2016-05-24 MED ORDER — PANTOPRAZOLE SODIUM 40 MG PO TBEC
40.0000 mg | DELAYED_RELEASE_TABLET | Freq: Every day | ORAL | Status: DC
  Administered 2016-05-24 – 2016-05-25 (×2): 40 mg via ORAL
  Filled 2016-05-24 (×2): qty 1

## 2016-05-24 MED ORDER — ACETAMINOPHEN 650 MG RE SUPP: 650.0000 mg | Freq: Four times a day (QID) | RECTAL | Status: DC | PRN

## 2016-05-24 NOTE — Progress Notes (Signed)
Inpatient Diabetes Program Recommendations  AACE/ADA: New Consensus Statement on Inpatient Glycemic Control (2015)  Target Ranges:  Prepandial:   less than 140 mg/dL      Peak postprandial:   less than 180 mg/dL (1-2 hours)      Critically ill patients:  140 - 180 mg/dL   Review of Glycemic Control  Diabetes history: DM 2 Outpatient Diabetes medications: 70/30 10 units Daily Current orders for Inpatient glycemic control: D 10 69ml/hr  Spoke with patient through [facility] interpreter about diabetes and home regimen for diabetes control. Patient reports that he takes 70/30 10 units once a day and on days he has dialysis he takes his insulin after dialysis with food usually. Patient reports he had taken his insulin after dialysis and had a friend that was suppose to be bringing him food but never showed up. Patient reports being on insulin for 14 years. Patient reports the last time he had hypoglycemia was also on a dialysis day and when he couldn't get food. Patient reports when he is at home and usually he is able to fix himself something to eat. Patient's last A1c 6% on 04/10/16. Patient is very familiar with how he should take insulin especially with food. He had circumstances with his friend not showing up with food that resulted in his hypoglycemia and with his renal function continued to have hypoglycemia. Patient verbalized understanding of information discussed and he states that he has no further questions at this time related to diabetes.  Thanks,  Tama Headings RN, MSN, [facility] Inpatient Diabetes Coordinator Team Pager [phone] (8a-5p)
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
## 2016-05-24 NOTE — Care Management Note (Signed)
Case Management Note  Patient Details  Name: Alvin Daniels MRN: [id number] Date of Birth: May 06, 1971  Subjective/Objective:                  From home with relative/roommate. /46 y.o. male with medical history significant of diabetes mellitus, GERD, pancreatitis, HCV, HIV, ESRD-HD (TTS), chronic diarrhea, who presents with hypoglycemia and AMS.   Action/Plan: Follow for disposition needs. Admit status OBSERVATION (Acute encephalopathy due to Hypoglycemia); anticipate discharge [place].   Expected Discharge Date:  05/26/16               Expected Discharge Plan:  [place]  In-House Referral:  NA  Discharge planning Services  CM Consult  Post Acute Care Choice:    Choice offered to:     DME Arranged:    DME Agency:     HH Arranged:    HH Agency:     Status of Service:  In process, will continue to follow  If discussed at Long Length of Stay Meetings, dates discussed:    Additional Comments: HD patient ([place] [phone]), please notify [facility] [phone]) when pt ready for discharge.  Fuller Mandril, RN 05/24/2016, 10:01 AM

## 2016-05-24 NOTE — ED Provider Notes (Signed)
Despite IV dextrose and PO food blood sugar continues to drop Will start D5NS drip and admit patient for DM education and monitoring    Junius Creamer, NP 05/24/16 [place], NP 05/24/16 [place], MD 05/24/16 [phone]
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
## 2016-05-24 NOTE — Progress Notes (Addendum)
Alvin Daniels 46 yo male with ESRD on hemodialysis, HIV, DM, chronic hypotension  currently under observation status for hypoglycemia.   Dialysis Orders: [facility] TTS 4 hours 160 F BFR 400/800 2k/2Ca EDW 45 kg  -Heparin bolus 2000 U IV q HD -4 mcg IV 3 x q week -Aranesp 100 mcg IV q week -Venofer 100 mg IV x 10  Acesss: R IJ TDC   Plan for HD tomorrow on schedule.   Full consult if admitted.   Lynnda Child PA-C [facility] Pager [phone] 05/24/2016,1:28 PM  Pt seen, examined and agree w A/P as above.  Kelly Splinter MD [employer] pager [phone]   05/25/2016, 9:54 AM
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
## 2016-05-24 NOTE — H&P (Signed)
History and Physical    [name] ZTI:[id number] DOB: May 07, 1971 DOA: 05/23/2016  Referring MD/NP/PA:   PCP: [name], MD   Patient coming from:  The patient is coming from home.  At baseline, pt is independent for most of ADL.    Chief Complaint: Hypoglycemia,  AMS  HPI: Alvin Daniels is a 46 y.o. male with medical history significant of diabetes mellitus, GERD, pancreatitis, HCV, HIV, ESRD-HD (TTS), chronic diarrhea, who presents with hypoglycemia and AMS.  Per report, pt was noted to be confused by his roommate and EMS was called. He was found to have CBG in 36 which improved with D10 to 144. He was reportedly seen at another hospital for hypoglycemia and discharged. When I saw pt in ED, he is alert, oriented 3. He usually inject 15 units of mixed insulin 70/30 units daily without any problem. He states that he injected 15 units of insulin at lunch time, but but did not eat food. He reports that he has chronic diarrhea, which has not changed. He denies sinus symptoms, no nausea, vomiting, abdominal pain, fever, chills, chest pain, shortness breath, cough, symptoms of UTI or unilateral weakness. His current blood sugar is 76. Patient states that he has been compliant to dialysis, last dialysis was on Thursday.  ED Course: pt was found to have blood pressure 216/176 (not sure if it is accurate), then 92/66 on the repeated measurement. WBC 4.8, INR 1.0, potassium is 3.3, bicarbonate 30, creatinine 4.84. Pt is placed on tele bed for obs.  Review of Systems:   General: no fevers, chills, no changes in body weight. HEENT: no blurry vision, hearing changes or sore throat Respiratory: no dyspnea, coughing, wheezing CV: no chest pain, no palpitations GI: no nausea, vomiting, abdominal pain, has chronic diarrhea, no constipation GU: no dysuria, burning on urination, increased urinary frequency, hematuria  Ext: no leg edema Neuro: no unilateral weakness, numbness, or  tingling, no vision change or hearing loss. Had confusion. Skin: no rash, no skin tear. MSK: No muscle spasm, no deformity, no limitation of range of movement in spin Heme: No easy bruising.  Travel history: No recent long distant travel.  Allergy: No Known Allergies  Past Medical History:  Diagnosis Date  . AIDS ([place]) 11/22/2014  . Chronic diarrhea   . Chronic hepatitis C without hepatic coma ([place]) 11/22/2014  . Diabetic neuropathy ([place])   . ESRD (end stage renal disease) on dialysis [facility])    "TTS; don't remember street name" (05/03/2014)  . Hepatitis C   . HIV INFECTION 06/27/2010   Qualifier: Diagnosis of  By: Nickola Major CMA ( [name]), Geni Bers    . Hypotension 06/02/2012  . Metabolic bone disease 12/13/5051  . MRSA infection   . Normocytic anemia 06/17/2012  . Pancreatitis   . Pressure ulcer of BKA stump, stage 2 ([place]) 11/22/2014  . Renal disorder   . Severe protein-calorie malnutrition ([place]) 06/17/2012  . Uncontrolled diabetes mellitus with complications ([place]) 9/76/7341   Annotation: uncontrolled Qualifier: Diagnosis of  By: Nickola Major CMA Deborra Medina), Geni Bers      Past Surgical History:  Procedure Laterality Date  . AMPUTATION Left 04/20/2014   Procedure: 3rd toe amputation, 4th Toe Amputation,  5th Toe Amputation;  Surgeon: Newt Minion, MD;  Location: [place];  Service: Orthopedics;  Laterality: Left;  . AMPUTATION Left 05/02/2014   Procedure: Midfoot Amputation;  Surgeon: Newt Minion, MD;  Location: [place];  Service: Orthopedics;  Laterality: Left;  . AMPUTATION Left 06/17/2014  Procedure: AMPUTATION BELOW KNEE;  Surgeon: Newt Minion, MD;  Location: [place];  Service: Orthopedics;  Laterality: Left;  . AV FISTULA PLACEMENT Left   . AV FISTULA PLACEMENT Left 05/10/2016   Procedure: Creation Left Arm Brachiocephalic Arteriovenous Fistula and Ligation of Radiocephalic Fistula;  Surgeon: Angelia Mould, MD;  Location: [place];  Service: Vascular;  Laterality: Left;  . FOOT  AMPUTATION THROUGH ANKLE Left 12/'21/2015   midfoot  . IR GENERIC HISTORICAL Left 05/01/2016   IR THROMBECTOMY AV FISTULA W/THROMBOLYSIS/PTA INC/SHUNT/IMG LEFT 05/01/2016 [name], MD [facility]  . IR GENERIC HISTORICAL  05/01/2016   IR US GUIDE VASC ACCESS LEFT 05/01/2016 [name], MD [facility]  . IR GENERIC HISTORICAL  05/07/2016   IR FLUORO GUIDE CV LINE RIGHT 05/07/2016 Corrie Mckusick, DO [facility]  . IR GENERIC HISTORICAL  05/07/2016   IR US GUIDE VASC ACCESS RIGHT 05/07/2016 Corrie Mckusick, DO [facility]  . IR GENERIC HISTORICAL  05/22/2016   IR US GUIDE VASC ACCESS RIGHT 05/22/2016 Greggory Keen, MD [facility]  . IR GENERIC HISTORICAL  05/22/2016   IR FLUORO GUIDE CV LINE RIGHT 05/22/2016 Greggory Keen, MD [facility]  . PERIPHERAL VASCULAR CATHETERIZATION Left 05/09/2016   Procedure: A/V Fistulagram;  Surgeon: Angelia Mould, MD;  Location: [place] [facility];  Service: Cardiovascular;  Laterality: Left;  arm    Social History:  reports that he has never smoked. He has never used smokeless tobacco. He reports that he does not drink alcohol or use drugs.  Family History:  Family History  Problem Relation Age of Onset  . Diabetes Mother   . Diabetes Father      Prior to Admission medications   Medication Sig Start Date End Date Taking? Authorizing Provider  lamiVUDine (EPIVIR) 10 MG/ML solution Take 2.5 mLs (25 mg total) by mouth daily. 04/04/16  Yes Bonnielee Haff, MD  midodrine (PROAMATINE) 10 MG tablet Take 1 tablet (10 mg total) by mouth 3 (three) times daily with meals. 04/24/16  Yes [name], MD  multivitamin (RENA-VIT) TABS tablet Take 1 tablet by mouth at bedtime. 03/06/16  Yes Marijean Heath, NP  omeprazole (PRILOSEC) 40 MG capsule TAKE 1 CAPSULE BY MOUTH EVERY DAY 04/24/16  Yes [name], MD  tenofovir (VIREAD) 300 MG tablet TAKE 1 TABLET BY MOUTH ONCE A WEEK ON SUNDAY 02/23/16  Yes Truman Hayward, MD  TIVICAY 50 MG  tablet TAKE 1 TABLET BY MOUTH EVERY DAY 02/23/16  Yes Truman Hayward, MD    Physical Exam: Vitals:   05/23/16 2330 05/23/16 2345 05/24/16 0205 05/24/16 0206  BP: (!) 170/112 (!) 89/25 92/66   Pulse: 81 82  81  SpO2: 100% 100%  100%  Weight:      Height:       General: Not in acute distress HEENT:       Eyes: PERRL, EOMI, no scleral icterus.       ENT: No discharge from the ears and nose, no pharynx injection, no tonsillar enlargement.        Neck: No JVD, no bruit, no mass felt. Heme: No neck lymph node enlargement. Cardiac: S1/S2, RRR, No murmurs, No gallops or rubs. Respiratory: No rales, wheezing, rhonchi or rubs. GI: Soft, nondistended, nontender, no rebound pain, no organomegaly, BS present. GU: No hematuria Ext: No pitting leg edema bilaterally. 2+DP/PT pulse on the right. S/p of L BKA. Musculoskeletal: No joint deformities, No joint redness or warmth, no limitation  of ROM in spin. Skin: No rashes.  Neuro: Alert, oriented X3, cranial nerves II-XII grossly intact, moves all extremities normally. Muscle strength 5/5 in all extremities. Psych: Patient is not psychotic, no suicidal or hemocidal ideation.  Labs on Admission: I have personally reviewed following labs and imaging studies  CBC:  Recent Labs Lab 05/22/16 0759 05/23/16 2157  WBC 4.4 4.8  HGB 11.5* 10.6*  HCT 36.1* 33.0*  MCV 95.5 93.8  PLT 223 427   Basic Metabolic Panel:  Recent Labs Lab 05/22/16 0759 05/23/16 2157  NA  --  135  K 4.9 3.3*  CL  --  98*  CO2  --  30  GLUCOSE  --  38*  BUN  --  5*  CREATININE  --  4.84*  CALCIUM  --  8.2*   GFR: Estimated Creatinine Clearance: 12.7 mL/min (by C-G formula based on SCr of 4.84 mg/dL (H)). Liver Function Tests:  Recent Labs Lab 05/23/16 2157  AST 22  ALT <5*  ALKPHOS 78  BILITOT 0.2*  PROT 7.7  ALBUMIN 2.3*   No results for input(s): LIPASE, AMYLASE in the last 168 hours. No results for input(s): AMMONIA in the last 168  hours. Coagulation Profile:  Recent Labs Lab 05/22/16 0759  INR 1.02   Cardiac Enzymes: No results for input(s): CKTOTAL, CKMB, CKMBINDEX, TROPONINI in the last 168 hours. BNP (last 3 results) No results for input(s): PROBNP in the last 8760 hours. HbA1C: No results for input(s): HGBA1C in the last 72 hours. CBG:  Recent Labs Lab 05/24/16 0037 05/24/16 0135 05/24/16 0219 05/24/16 0326 05/24/16 0428  GLUCAP 109* 93 86 130* 76   Lipid Profile: No results for input(s): CHOL, HDL, LDLCALC, TRIG, CHOLHDL, LDLDIRECT in the last 72 hours. Thyroid Function Tests: No results for input(s): TSH, T4TOTAL, FREET4, T3FREE, THYROIDAB in the last 72 hours. Anemia Panel: No results for input(s): VITAMINB12, FOLATE, FERRITIN, TIBC, IRON, RETICCTPCT in the last 72 hours. Urine analysis:    Component Value Date/Time   COLORURINE YELLOW 07/04/2010 1840   APPEARANCEUR TURBID (A) 07/04/2010 1840   LABSPEC 1.013 07/04/2010 1840   PHURINE 7.0 07/04/2010 1840   GLUCOSEU NEG mg/dL 07/04/2010 1840   HGBUR TRACE (A) 03/13/2010 1824   BILIRUBINUR NEG 07/04/2010 1840   KETONESUR NEG mg/dL 07/04/2010 1840   PROTEINUR > 300 mg/dL (A) 07/04/2010 1840   UROBILINOGEN 0.2 07/04/2010 1840   NITRITE NEG 07/04/2010 1840   LEUKOCYTESUR LARGE (A) 07/04/2010 1840   Sepsis Labs: @LABRCNTIP (procalcitonin:4,lacticidven:4) )No results found for this or any previous visit (from the past 240 hour(s)).   Radiological Exams on Admission: Ir Fluoro Guide Cv Line Right  Result Date: 05/22/2016 INDICATION: End-stage renal disease, no current access for dialysis EXAM: ULTRASOUND GUIDANCE FOR VASCULAR ACCESS RIGHT INTERNAL JUGULAR PERMANENT HEMODIALYSIS CATHETER Date:  1/10/20181/02/2017 9:57 am Radiologist:  M. Daryll Brod, MD Guidance:  Ultrasound fluoroscopic FLUOROSCOPY TIME:  Fluoroscopy Time:  minutes 36 seconds (1.0 mGy). MEDICATIONS: 2 g Ancef administered within 1 hour of the procedure ANESTHESIA/SEDATION:  Versed 1.0 mg IV; Fentanyl 25 mcg IV; Moderate Sedation Time:  15 minutes The patient was continuously monitored during the procedure by the interventional radiology nurse under my direct supervision. CONTRAST:  None. COMPLICATIONS: None immediate. PROCEDURE: Informed consent was obtained from the patient following explanation of the procedure, risks, benefits and alternatives. The patient understands, agrees and consents for the procedure. All questions were addressed. A time out was performed. Maximal barrier sterile technique utilized including caps, mask, sterile  gowns, sterile gloves, large sterile drape, hand hygiene, and 2% chlorhexidine scrub. Under sterile conditions and local anesthesia, right internal jugular micropuncture venous access was performed with ultrasound. Images were obtained for documentation. A guide wire was inserted followed by a transitional dilator. Next, a 0.035 guidewire was advanced into the IVC with a 5-French catheter. Measurements were obtained from the right venotomy site to the proximal right atrium. In the right infraclavicular chest, a subcutaneous tunnel was created under sterile conditions and local anesthesia. 1% lidocaine with epinephrine was utilized for this. The 19 cm tip to cuff palindrome catheter was tunneled subcutaneously to the venotomy site and inserted into the SVC/RA junction through a valved peel-away sheath. Position was confirmed with fluoroscopy. Images were obtained for documentation. Blood was aspirated from the catheter followed by saline and heparin flushes. The appropriate volume and strength of heparin was instilled in each lumen. Caps were applied. The catheter was secured at the tunnel site with Gelfoam and a pursestring suture. The venotomy site was closed with subcuticular Vicryl suture. Dermabond was applied to the small right neck incision. A dry sterile dressing was applied. The catheter is ready for use. No immediate complications. IMPRESSION:  Ultrasound and fluoroscopically guided right internal jugular tunneled hemodialysis catheter (19 cm tip to cuff palindrome catheter). Electronically Signed   By: Jerilynn Mages.  Shick M.D.   On: 05/22/2016 10:16   Ir US Guide Vasc Access Right  Result Date: 05/22/2016 INDICATION: End-stage renal disease, no current access for dialysis EXAM: ULTRASOUND GUIDANCE FOR VASCULAR ACCESS RIGHT INTERNAL JUGULAR PERMANENT HEMODIALYSIS CATHETER Date:  1/10/20181/02/2017 9:57 am Radiologist:  M. Daryll Brod, MD Guidance:  Ultrasound fluoroscopic FLUOROSCOPY TIME:  Fluoroscopy Time:  minutes 36 seconds (1.0 mGy). MEDICATIONS: 2 g Ancef administered within 1 hour of the procedure ANESTHESIA/SEDATION: Versed 1.0 mg IV; Fentanyl 25 mcg IV; Moderate Sedation Time:  15 minutes The patient was continuously monitored during the procedure by the interventional radiology nurse under my direct supervision. CONTRAST:  None. COMPLICATIONS: None immediate. PROCEDURE: Informed consent was obtained from the patient following explanation of the procedure, risks, benefits and alternatives. The patient understands, agrees and consents for the procedure. All questions were addressed. A time out was performed. Maximal barrier sterile technique utilized including caps, mask, sterile gowns, sterile gloves, large sterile drape, hand hygiene, and 2% chlorhexidine scrub. Under sterile conditions and local anesthesia, right internal jugular micropuncture venous access was performed with ultrasound. Images were obtained for documentation. A guide wire was inserted followed by a transitional dilator. Next, a 0.035 guidewire was advanced into the IVC with a 5-French catheter. Measurements were obtained from the right venotomy site to the proximal right atrium. In the right infraclavicular chest, a subcutaneous tunnel was created under sterile conditions and local anesthesia. 1% lidocaine with epinephrine was utilized for this. The 19 cm tip to cuff palindrome  catheter was tunneled subcutaneously to the venotomy site and inserted into the SVC/RA junction through a valved peel-away sheath. Position was confirmed with fluoroscopy. Images were obtained for documentation. Blood was aspirated from the catheter followed by saline and heparin flushes. The appropriate volume and strength of heparin was instilled in each lumen. Caps were applied. The catheter was secured at the tunnel site with Gelfoam and a pursestring suture. The venotomy site was closed with subcuticular Vicryl suture. Dermabond was applied to the small right neck incision. A dry sterile dressing was applied. The catheter is ready for use. No immediate complications. IMPRESSION: Ultrasound and fluoroscopically guided right internal jugular  tunneled hemodialysis catheter (19 cm tip to cuff palindrome catheter). Electronically Signed   By: Jerilynn Mages.  Shick M.D.   On: 05/22/2016 10:16     EKG:   Not done in ED, will get one.   Assessment/Plan Principal Problem:   Hypoglycemia Active Problems:   Human immunodeficiency virus (HIV) disease ([name])   Diabetes mellitus with ESRD (end-stage renal disease) ([place])   ESRD on dialysis ([place])   GERD (gastroesophageal reflux disease)   Hypokalemia   Acute encephalopathy   Acute encephalopathy due to Hypoglycemia: His hypoglycemia is most likely due to inappropriate use of insulin. Altered mental status has resolved along with the correction of hypoglycemia. -will place on tele bed -switch D5-NS to D10 at 50 cc/h -CBG q1h  -D50 prn  DM-II: Last A1c 6.0 on 04/10/16, well controled. Patient is taking mixed insulin 70/30 once per day at home. Now has hypoglycemia -will consult to diabetic educator -Hold insulin  Human immunodeficiency virus (HIV) disease: CD4 180 and VL<20 on 02/2016 -continue home HIV meds  Hypokalemia: K=3.3 on admission. - Repleted  GERD: -Protonix  ESRD on dialysis (TTS): pt has been compliant to dialysis. Last dialysis was on  Thursday. potassium is 3.3, bicarbonate 30, creatinine 4.84. -left message to renal boxer for dialysis tomorrow   DVT ppx: SQ Lovenox Code Status: Full code Family Communication: None at bed side.   Disposition Plan:  Anticipate discharge back to previous home environment Consults called:  none Admission status: Obs / tele    Date of Service 05/24/2016    Ivor Costa [facility] Pager [phone]  If 7PM-7AM, please contact night-coverage [email or web address] Password [facility] 05/24/2016, 5:40 AM

## 2016-05-25 DIAGNOSIS — Z992 Dependence on renal dialysis: Secondary | ICD-10-CM

## 2016-05-25 DIAGNOSIS — N186 End stage renal disease: Secondary | ICD-10-CM

## 2016-05-25 LAB — BASIC METABOLIC PANEL
Anion gap: 9 (ref 5–15)
BUN: 11 mg/dL (ref 6–20)
CHLORIDE: 102 mmol/L (ref 101–111)
CO2: 24 mmol/L (ref 22–32)
Calcium: 9.1 mg/dL (ref 8.9–10.3)
Creatinine, Ser: 7.65 mg/dL — ABNORMAL HIGH (ref 0.61–1.24)
GFR calc Af Amer: 9 mL/min — ABNORMAL LOW (ref >60)
GFR calc non Af Amer: 8 mL/min — ABNORMAL LOW (ref >60)
GLUCOSE: 91 mg/dL (ref 65–99)
Potassium: 5.2 mmol/L — ABNORMAL HIGH (ref 3.5–5.1)
Sodium: 135 mmol/L (ref 135–145)

## 2016-05-25 LAB — CBC
HCT: 36.3 % — ABNORMAL LOW (ref 39.0–52.0)
Hemoglobin: 11.7 g/dL — ABNORMAL LOW (ref 13.0–17.0)
MCH: 30.9 pg (ref 26.0–34.0)
MCHC: 32.2 g/dL (ref 30.0–36.0)
MCV: 95.8 fL (ref 78.0–100.0)
Platelets: 259 10*3/uL (ref 150–400)
RBC: 3.79 MIL/uL — ABNORMAL LOW (ref 4.22–5.81)
RDW: 16.4 % — AB (ref 11.5–15.5)
WBC: 6.7 10*3/uL (ref 4.0–10.5)

## 2016-05-25 LAB — GLUCOSE, CAPILLARY
Glucose-Capillary: 90 mg/dL (ref 65–99)
Glucose-Capillary: 90 mg/dL (ref 65–99)
Glucose-Capillary: 92 mg/dL (ref 65–99)
Glucose-Capillary: 83 mg/dL (ref 65–99)

## 2016-05-25 LAB — HEMOGLOBIN A1C
Hgb A1c MFr Bld: 5 % (ref 4.8–5.6)
MEAN PLASMA GLUCOSE: 97 mg/dL

## 2016-05-25 MED ORDER — PENTAFLUOROPROP-TETRAFLUOROETH EX AERO: 1.0000 "application " | INHALATION_SPRAY | CUTANEOUS | Status: DC | PRN

## 2016-05-25 MED ORDER — SODIUM CHLORIDE 0.9 % IV SOLN: 100.0000 mL | INTRAVENOUS | Status: DC | PRN

## 2016-05-25 MED ORDER — ALTEPLASE 2 MG IJ SOLR: 2.0000 mg | Freq: Once | INTRAMUSCULAR | Status: DC | PRN

## 2016-05-25 MED ORDER — MIDODRINE HCL 5 MG PO TABS
ORAL_TABLET | ORAL | Status: AC
  Filled 2016-05-25: qty 2

## 2016-05-25 MED ORDER — HEPARIN SODIUM (PORCINE) 1000 UNIT/ML DIALYSIS: 20.0000 [IU]/kg | INTRAMUSCULAR | Status: DC | PRN

## 2016-05-25 MED ORDER — HEPARIN SODIUM (PORCINE) 1000 UNIT/ML DIALYSIS: 1000.0000 [IU] | INTRAMUSCULAR | Status: DC | PRN

## 2016-05-25 MED ORDER — LIDOCAINE HCL (PF) 1 % IJ SOLN: 5.0000 mL | INTRAMUSCULAR | Status: DC | PRN

## 2016-05-25 MED ORDER — LIDOCAINE-PRILOCAINE 2.5-2.5 % EX CREA: 1.0000 "application " | TOPICAL_CREAM | CUTANEOUS | Status: DC | PRN

## 2016-05-25 NOTE — Care Management Note (Signed)
Case Management Note  Patient Details  Name: Alvin Daniels MRN: [id number] Date of Birth: 12-Aug-1970  Subjective/Objective:   Notified [facility] [phone]) of patients return to [facility] for tx. Made them aware he had received Tx today 05/25/2016. Spoke with Courtney Heys with the  Answering Service and relayed all Demographic Information.                 Action/Plan: No further CM needs at this time.    Expected Discharge Date:  05/25/16               Expected Discharge Plan:  [place]  In-House Referral:  NA  Discharge planning Services  CM Consult  Post Acute Care Choice:    Choice offered to:     DME Arranged:    DME Agency:     HH Arranged:    HH Agency:     Status of Service:  In process, will continue to follow  If discussed at Long Length of Stay Meetings, dates discussed:    Additional Comments:  Delrae Sawyers, RN 05/25/2016, 12:49 PM

## 2016-05-25 NOTE — Discharge Summary (Signed)
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 sterile drape, hand hygiene, and 2% chlorhexidine scrub. Under sterile conditions and local anesthesia, right internal jugular micropuncture venous access was performed with ultrasound. Images were obtained for documentation. A guide wire was inserted followed by a transitional dilator. Next, a 0.035 guidewire was advanced into the IVC with a 5-French catheter. Measurements were obtained from the right venotomy site to the proximal right atrium. In the right infraclavicular chest, a subcutaneous tunnel was created under sterile conditions and local anesthesia. 1% lidocaine with epinephrine was utilized for this. The 19 cm tip to cuff palindrome catheter was tunneled subcutaneously to the venotomy site and inserted into the SVC/RA junction through a valved peel-away sheath. Position was confirmed with fluoroscopy. Images were obtained for documentation. Blood was aspirated from the catheter followed by saline and heparin flushes. The appropriate volume and strength of heparin was instilled in each  lumen. Caps were applied. The catheter was secured at the tunnel site with Gelfoam and a pursestring suture. The venotomy site was closed with subcuticular Vicryl suture. Dermabond was applied to the small right neck incision. A dry sterile dressing was applied. The catheter is ready for use. No immediate complications. IMPRESSION: Ultrasound and fluoroscopically guided right internal jugular tunneled hemodialysis catheter (19 cm tip to cuff palindrome catheter). Electronically Signed   By: Jerilynn Mages.  Shick M.D.   On: 05/22/2016 10:16   Ir Fluoro Guide Cv Line Right  Result Date: 05/07/2016 INDICATION: 46 year old male with a history of renal failure. He has had malfunctioning dialysis circuit and is now referred for hemodialysis catheter. EXAM: TUNNELED CENTRAL VENOUS HEMODIALYSIS CATHETER PLACEMENT WITH ULTRASOUND AND FLUOROSCOPIC GUIDANCE MEDICATIONS: 2.0 g Ancef . The antibiotic was given in an appropriate time interval prior to skin puncture. ANESTHESIA/SEDATION: A total of Versed 0 mg and Fentanyl 75 mcg was administered intravenously. No sedation FLUOROSCOPY TIME:  Fluoroscopy Time: 0 minutes 54 seconds (2.0 mGy). COMPLICATIONS: None PROCEDURE: Informed written consent was obtained from the patient via interpreter after a discussion of the risks, benefits, and alternatives to treatment. Questions regarding the procedure were encouraged and answered. The right neck and chest were prepped with chlorhexidine in a sterile fashion, and a sterile drape was applied covering the operative field. Maximum barrier sterile technique with sterile gowns and gloves were used for the procedure. A timeout was performed prior to the initiation of the procedure. After creating a small venotomy incision, a micropuncture kit was utilized to access the right internal jugular vein under direct, real-time ultrasound guidance after the overlying soft  tissues were anesthetized with 1% lidocaine with epinephrine. Ultrasound image  documentation was performed. The microwire was marked to measure appropriate internal catheter length. External tunneled length was estimated. A total tip to cuff length of 19 cm was selected. Skin and subcutaneous tissues of chest wall below the clavicle were generously infiltrated with 1% lidocaine for local anesthesia. A small stab incision was made with 11 blade scalpel. The selected hemodialysis catheter was tunneled in a retrograde fashion from the anterior chest wall to the venotomy incision. A guidewire was advanced to the level of the IVC and the micropuncture sheath was exchanged for a peel-away sheath. The catheter was then placed through the peel-away sheath with tips ultimately positioned within the superior aspect of the right atrium. Final catheter positioning was confirmed and documented with a spot radiographic image. The catheter aspirates and flushes normally. The catheter was flushed with appropriate volume heparin dwells. The catheter exit site was secured with a 0-Prolene retention suture. The venotomy incision was closed Derma bond and sterile dressing. Dressings were applied at the chest wall. Patient tolerated the procedure well and remained hemodynamically stable throughout. No complications were encountered and no significant blood loss encountered. IMPRESSION: Status post right IJ tunneled hemodialysis catheter 19 cm tip to cuff. Catheter ready for use. Signed, Dulcy Fanny. Earleen Newport, DO [facility] Radiology Electronically Signed   By: Corrie Mckusick D.O.   On: 05/07/2016 14:37   Ir US Guide Vasc Access Left  Result Date: 05/01/2016 CLINICAL DATA:  Occluded left forearm AV hemodialysis fistula EXAM: DIALYSIS FISTULA DECLOT VENOUS ANGIOPLASTY ULTRASOUND GUIDANCE FOR VASCULAR ACCESS FLUOROSCOPY TIME:  2 minutes 42 second, 4 mGy TECHNIQUE: The procedure, risks (including but not limited to bleeding, infection, organ damage ), benefits, and  alternatives were explained to the patient with the aid of interpreter. Questions regarding the procedure were encouraged and answered. The patient understands and consents to the procedure. The outflow vein of the fistula just central to the anastomosis was accessed antegrade with 21-gauge micropuncture needle under real-time ultrasonic guidance after the overlying skin prepped with chlorhexidine, draped in usual sterile fashion, infiltrated locally with 1%lidocaine. Needle exchanged over 018guidewire for transitional dilator through which 2 mg t-PA was administered. Ultrasound images were stored. Through the dilator, a Bentson wire was advanced . Over this a 73F sheath was placed, through which a 5 [place] Kumpe catheter was advanced for outflow venography. This showed patency of the outflow cephalic vein above the elbow through the SVC . 3000 units heparin were administered IV. The Kumpe was exchanged over guide wire for the AngioJet device, used to mechanically thrombolyse the outflow vein and remove the thrombus from the native outflow. Outflow venography showed restoration of flow antegrade. 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 A 7 mm x 4 cm Conquest angioplasty balloon was advanced into the outflow vein. Angioplasty of the venous stenosis was performed. 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 The Angiojet device was again advanced in used to further mechanically thrombolysis the residual thrombus in the outflow vein. Follow-up venography showed further clearance of residual thrombus. There was some recoil at the site of venous angioplasty so this site was further dilated with an 8 mm Mustang balloon for 60 seconds. Follow-up outflow venogram shows good flow through this segment with no residual or recurrent stenosis. No extravasation or apparent complication of angioplasty.  Follow up shuntogram showed good flow through the outflow vein, no extravasation. The catheter and sheath were then removed and hemostasis achieved with 2-0 Ethilon suture anchor. Patient tolerated procedure well. IMPRESSION: 1. Technically successful declot of left forearm native AV hemodialysis fistula. 2. Technically successful balloon angioplasty of venous outflow stenosis. ACCESS: Remains approachable for percutaneous intervention as needed. Electronically Signed   By: Lucrezia Europe M.D.   On: 05/01/2016 13:02   Ir US Guide Vasc Access Right  Result Date: 05/22/2016 INDICATION: End-stage renal disease, no current access for dialysis EXAM: ULTRASOUND GUIDANCE FOR VASCULAR ACCESS RIGHT INTERNAL JUGULAR PERMANENT HEMODIALYSIS CATHETER Date:  1/10/20181/02/2017 9:57 am Radiologist:  M. Daryll Brod, MD Guidance:  Ultrasound fluoroscopic FLUOROSCOPY TIME:  Fluoroscopy Time:  minutes 36 seconds (1.0 mGy). MEDICATIONS: 2 g Ancef administered within 1 hour of the procedure ANESTHESIA/SEDATION: Versed 1.0 mg IV; Fentanyl 25 mcg IV; Moderate Sedation Time:  15 minutes The patient was continuously monitored during the procedure by the interventional radiology nurse under my direct supervision. CONTRAST:  None. COMPLICATIONS: None immediate. PROCEDURE: Informed consent was obtained from the patient following explanation of the procedure, risks, benefits and alternatives. The patient understands, agrees and consents for the procedure. All questions were addressed. A time out was performed. Maximal barrier sterile technique utilized including caps, mask, sterile gowns, sterile gloves, large sterile drape, hand hygiene, and 2% chlorhexidine scrub. Under sterile conditions and local anesthesia, right internal jugular micropuncture venous access was performed with ultrasound. Images were obtained for documentation. A guide wire was inserted followed by a transitional dilator. Next, a 0.035 guidewire was advanced into the IVC  with a 5-French catheter. Measurements were obtained from the right venotomy site to the proximal right atrium. In the right infraclavicular chest, a subcutaneous tunnel was created under sterile conditions and local anesthesia. 1% lidocaine with epinephrine was utilized for this. The 19 cm tip to cuff palindrome catheter was tunneled subcutaneously to the venotomy site and inserted into the SVC/RA junction through a valved peel-away sheath. Position was confirmed with fluoroscopy. Images were obtained for documentation. Blood was aspirated from the catheter followed by saline and heparin flushes. The appropriate volume and strength of heparin was instilled in each lumen. Caps were applied. The catheter was secured at the tunnel site with Gelfoam and a pursestring suture. The venotomy site was closed with subcuticular Vicryl suture. Dermabond was applied to the small right neck incision. A dry sterile dressing was applied. The catheter is ready for use. No immediate complications. IMPRESSION: Ultrasound and fluoroscopically guided right internal jugular tunneled hemodialysis catheter (19 cm tip to cuff palindrome catheter). Electronically Signed   By: Jerilynn Mages.  Shick M.D.   On: 05/22/2016 10:16   Ir US Guide Vasc Access Right  Result Date: 05/07/2016 INDICATION: 46 year old male with a history of renal failure. He has had malfunctioning dialysis circuit and is now referred for hemodialysis catheter. EXAM: TUNNELED CENTRAL VENOUS HEMODIALYSIS CATHETER PLACEMENT WITH ULTRASOUND AND FLUOROSCOPIC GUIDANCE MEDICATIONS: 2.0 g Ancef . The antibiotic was given in an appropriate time interval prior to skin puncture. ANESTHESIA/SEDATION: A total of Versed 0 mg and Fentanyl 75 mcg was administered intravenously. No sedation FLUOROSCOPY TIME:  Fluoroscopy Time: 0 minutes 54 seconds (2.0 mGy). COMPLICATIONS: None PROCEDURE: Informed written consent was obtained from the patient via interpreter after a discussion of the risks,  benefits, and alternatives to treatment. Questions regarding the procedure were encouraged and answered. The right neck and chest were prepped with chlorhexidine in a sterile fashion, and  a sterile drape was applied covering the operative field. Maximum barrier sterile technique with sterile gowns and gloves were used for the procedure. A timeout was performed prior to the initiation of the procedure. After creating a small venotomy incision, a micropuncture kit was utilized to access the right internal jugular vein under direct, real-time ultrasound guidance after the overlying soft tissues were anesthetized with 1% lidocaine with epinephrine. Ultrasound image documentation was performed. The microwire was marked to measure appropriate internal catheter length. External tunneled length was estimated. A total tip to cuff length of 19 cm was selected. Skin and subcutaneous tissues of chest wall below the clavicle were generously infiltrated with 1% lidocaine for local anesthesia. A small stab incision was made with 11 blade scalpel. The selected hemodialysis catheter was tunneled in a retrograde fashion from the anterior chest wall to the venotomy incision. A guidewire was advanced to the level of the IVC and the micropuncture sheath was exchanged for a peel-away sheath. The catheter was then placed through the peel-away sheath with tips ultimately positioned within the superior aspect of the right atrium. Final catheter positioning was confirmed and documented with a spot radiographic image. The catheter aspirates and flushes normally. The catheter was flushed with appropriate volume heparin dwells. The catheter exit site was secured with a 0-Prolene retention suture. The venotomy incision was closed Derma bond and sterile dressing. Dressings were applied at the chest wall. Patient tolerated the procedure well and remained hemodynamically stable throughout. No complications were encountered and no significant blood  loss encountered. IMPRESSION: Status post right IJ tunneled hemodialysis catheter 19 cm tip to cuff. Catheter ready for use. Signed, Dulcy Fanny. Earleen Newport, DO [facility] Radiology Electronically Signed   By: Corrie Mckusick D.O.   On: 05/07/2016 14:37   Ir Thrombectomy Av Fistula W/thrombolysis/pta Inc/shunt/img Left  Result Date: 05/01/2016 CLINICAL DATA:  Occluded left forearm AV hemodialysis fistula EXAM: DIALYSIS FISTULA DECLOT VENOUS ANGIOPLASTY ULTRASOUND GUIDANCE FOR VASCULAR ACCESS FLUOROSCOPY TIME:  2 minutes 42 second, 4 mGy TECHNIQUE: The procedure, risks (including but not limited to bleeding, infection, organ damage ), benefits, and alternatives were explained to the patient with the aid of interpreter. Questions regarding the procedure were encouraged and answered. The patient understands and consents to the procedure. The outflow vein of the fistula just central to the anastomosis was accessed antegrade with 21-gauge micropuncture needle under real-time ultrasonic guidance after the overlying skin prepped with chlorhexidine, draped in usual sterile fashion, infiltrated locally with 1%lidocaine. Needle exchanged over 018guidewire for transitional dilator through which 2 mg t-PA was administered. Ultrasound images were stored. Through the dilator, a Bentson wire was advanced . Over this a 52F sheath was placed, through which a 5 [place] Kumpe catheter was advanced for outflow venography. This showed patency of the outflow cephalic vein above the elbow through the SVC . 3000 units heparin were administered IV. The Kumpe was exchanged over guide wire for the AngioJet device, used to mechanically thrombolyse the outflow vein and remove the thrombus from the native outflow. Outflow venography showed restoration of flow antegrade. 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 A 7 mm x 4 cm Conquest  angioplasty balloon was advanced into the outflow vein. Angioplasty of the venous stenosis was performed. 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 The Angiojet device was again advanced in used to further mechanically thrombolysis the  residual thrombus in the outflow vein. Follow-up venography showed further clearance of residual thrombus. There was some recoil at the site of venous angioplasty so this site was further dilated with an 8 mm Mustang balloon for 60 seconds. Follow-up outflow venogram shows good flow through this segment with no residual or recurrent stenosis. No extravasation or apparent complication of angioplasty. Follow up shuntogram showed good flow through the outflow vein, no extravasation. The catheter and sheath were then removed and hemostasis achieved with 2-0 Ethilon suture anchor. Patient tolerated procedure well. IMPRESSION: 1. Technically successful declot of left forearm native AV hemodialysis fistula. 2. Technically successful balloon angioplasty of venous outflow stenosis. ACCESS: Remains approachable for percutaneous intervention as needed. Electronically Signed   By: Lucrezia Europe M.D.   On: 05/01/2016 13:02     PERTINENT LAB RESULTS: CBC:  Recent Labs  05/23/16 2157 05/25/16 0747  WBC 4.8 6.7  HGB 10.6* 11.7*  HCT 33.0* 36.3*  PLT 208 259   CMET CMP     Component Value Date/Time   NA 135 05/25/2016 0747   K 5.2 (H) 05/25/2016 0747   CL 102 05/25/2016 0747   CO2 24 05/25/2016 0747   GLUCOSE 91 05/25/2016 0747   BUN 11 05/25/2016 0747   CREATININE 7.65 (H) 05/25/2016 0747   CREATININE 4.44 (H) 10/19/2015 1409   CALCIUM 9.1 05/25/2016 0747   CALCIUM 8.5 03/12/2010 1250   PROT 7.7 05/23/2016 2157   ALBUMIN 2.3 (L) 05/23/2016 2157   AST 22 05/23/2016 2157   ALT <5 (L) 05/23/2016 2157   ALKPHOS 78 05/23/2016 2157   BILITOT 0.2 (L) 05/23/2016 2157   GFRNONAA 8 (L) 05/25/2016 0747    GFRNONAA 15 (L) 10/19/2015 1409   GFRAA 9 (L) 05/25/2016 0747   GFRAA 17 (L) 10/19/2015 1409    GFR Estimated Creatinine Clearance: 7.6 mL/min (by C-G formula based on SCr of 7.65 mg/dL (H)). No results for input(s): LIPASE, AMYLASE in the last 72 hours. No results for input(s): CKTOTAL, CKMB, CKMBINDEX, TROPONINI in the last 72 hours. Invalid input(s): POCBNP No results for input(s): DDIMER in the last 72 hours.  Recent Labs  05/24/16 0756  HGBA1C 5.0   No results for input(s): CHOL, HDL, LDLCALC, TRIG, CHOLHDL, LDLDIRECT in the last 72 hours. No results for input(s): TSH, T4TOTAL, T3FREE, THYROIDAB in the last 72 hours.  Invalid input(s): FREET3 No results for input(s): VITAMINB12, FOLATE, FERRITIN, TIBC, IRON, RETICCTPCT in the last 72 hours. Coags: No results for input(s): INR in the last 72 hours.  Invalid input(s): PT Microbiology: No results found for this or any previous visit (from the past 240 hour(s)).  FURTHER DISCHARGE INSTRUCTIONS:  Get Medicines reviewed and adjusted: Please take all your medications with you for your next visit with your Primary MD  Laboratory/radiological data: Please request your Primary MD to go over all hospital tests and procedure/radiological results at the follow up, please ask your Primary MD to get all Hospital records sent to his/her office.  In some cases, they will be blood work, cultures and biopsy results pending at the time of your discharge. Please request that your primary care M.D. goes through all the records of your hospital data and follows up on these results.  Also Note the following: If you experience worsening of your admission symptoms, develop shortness of breath, life threatening emergency, suicidal or homicidal thoughts you must seek medical attention immediately by calling 911 or calling your MD immediately  if symptoms less severe.  You  must read complete instructions/literature along with all the possible  adverse reactions/side effects for all the Medicines you take and that have been prescribed to you. Take any new Medicines after you have completely understood and accpet all the possible adverse reactions/side effects.   Do not drive when taking Pain medications or sleeping medications (Benzodaizepines)  Do not take more than prescribed Pain, Sleep and Anxiety Medications. It is not advisable to combine anxiety,sleep and pain medications without talking with your primary care practitioner  Special Instructions: If you have smoked or chewed Tobacco  in the last 2 yrs please stop smoking, stop any regular Alcohol  and or any Recreational drug use.  Wear Seat belts while driving.  Please note: You were cared for by a hospitalist during your hospital stay. Once you are discharged, your primary care physician will handle any further medical issues. Please note that NO REFILLS for any discharge medications will be authorized once you are discharged, as it is imperative that you return to your primary care physician (or establish a relationship with a primary care physician if you do not have one) for your post hospital discharge needs so that they can reassess your need for medications and monitor your lab values.  Total Time spent coordinating discharge including counseling, education and face to face time equals 35 minutes.  SignedOren Binet 05/25/2016 12:07 PM

## 2016-05-25 NOTE — Procedures (Signed)
Patient stable on HD, BP 120/ 80, no c/o's.  For dc after HD.    I was present at this dialysis session, have reviewed the session itself and made  appropriate changes Kelly Splinter MD [facility] pager [phone]   05/25/2016, 9:54 AM

## 2016-05-28 ENCOUNTER — Emergency Department (HOSPITAL_COMMUNITY)
Admission: EM | Admit: 2016-05-28 | Discharge: 2016-05-28 | Disposition: A | Discharge status: Home / Self Care | Payer: Medicaid Other | Attending: Emergency Medicine | Admitting: Emergency Medicine

## 2016-05-28 ENCOUNTER — Encounter (HOSPITAL_COMMUNITY): Payer: Self-pay

## 2016-05-28 DIAGNOSIS — T82838A Hemorrhage of vascular prosthetic devices, implants and grafts, initial encounter: Secondary | ICD-10-CM | POA: Insufficient documentation

## 2016-05-28 DIAGNOSIS — E1122 Type 2 diabetes mellitus with diabetic chronic kidney disease: Secondary | ICD-10-CM | POA: Insufficient documentation

## 2016-05-28 DIAGNOSIS — I120 Hypertensive chronic kidney disease with stage 5 chronic kidney disease or end stage renal disease: Secondary | ICD-10-CM | POA: Insufficient documentation

## 2016-05-28 DIAGNOSIS — Y738 Miscellaneous gastroenterology and urology devices associated with adverse incidents, not elsewhere classified: Secondary | ICD-10-CM | POA: Insufficient documentation

## 2016-05-28 DIAGNOSIS — Z992 Dependence on renal dialysis: Secondary | ICD-10-CM | POA: Insufficient documentation

## 2016-05-28 DIAGNOSIS — E1140 Type 2 diabetes mellitus with diabetic neuropathy, unspecified: Secondary | ICD-10-CM | POA: Insufficient documentation

## 2016-05-28 DIAGNOSIS — Z79899 Other long term (current) drug therapy: Secondary | ICD-10-CM | POA: Insufficient documentation

## 2016-05-28 DIAGNOSIS — N186 End stage renal disease: Secondary | ICD-10-CM | POA: Insufficient documentation

## 2016-05-28 LAB — I-STAT CHEM 8, ED
BUN: 3 mg/dL — ABNORMAL LOW (ref 6–20)
BUN: 3 mg/dL — ABNORMAL LOW (ref 6–20)
CREATININE: 3.4 mg/dL — AB (ref 0.61–1.24)
Calcium, Ion: 0.9 mmol/L — ABNORMAL LOW (ref 1.15–1.40)
Calcium, Ion: 0.9 mmol/L — ABNORMAL LOW (ref 1.15–1.40)
Chloride: 94 mmol/L — ABNORMAL LOW (ref 101–111)
Chloride: 94 mmol/L — ABNORMAL LOW (ref 101–111)
Creatinine, Ser: 3.4 mg/dL — ABNORMAL HIGH (ref 0.61–1.24)
Glucose, Bld: 94 mg/dL (ref 65–99)
Glucose, Bld: 94 mg/dL (ref 65–99)
HEMATOCRIT: 36 % — AB (ref 39.0–52.0)
HEMATOCRIT: 36 % — AB (ref 39.0–52.0)
HEMOGLOBIN: 12.2 g/dL — AB (ref 13.0–17.0)
Hemoglobin: 12.2 g/dL — ABNORMAL LOW (ref 13.0–17.0)
POTASSIUM: 3.2 mmol/L — AB (ref 3.5–5.1)
Potassium: 3.2 mmol/L — ABNORMAL LOW (ref 3.5–5.1)
SODIUM: 138 mmol/L (ref 135–145)
SODIUM: 138 mmol/L (ref 135–145)
TCO2: 30 mmol/L (ref 0–100)
TCO2: 30 mmol/L (ref 0–100)

## 2016-05-28 LAB — CBC WITH DIFFERENTIAL/PLATELET
BASOS ABS: 0.1 10*3/uL (ref 0.0–0.1)
BASOS PCT: 1 %
EOS ABS: 0.3 10*3/uL (ref 0.0–0.7)
Eosinophils Relative: 6 %
HCT: 34.1 % — ABNORMAL LOW (ref 39.0–52.0)
HEMOGLOBIN: 11.1 g/dL — AB (ref 13.0–17.0)
LYMPHS ABS: 1.9 10*3/uL (ref 0.7–4.0)
Lymphocytes Relative: 41 %
MCH: 30.8 pg (ref 26.0–34.0)
MCHC: 32.6 g/dL (ref 30.0–36.0)
MCV: 94.7 fL (ref 78.0–100.0)
Monocytes Absolute: 0.5 10*3/uL (ref 0.1–1.0)
Monocytes Relative: 11 %
NEUTROS PCT: 41 %
Neutro Abs: 2 10*3/uL (ref 1.7–7.7)
PLATELETS: 176 10*3/uL (ref 150–400)
RBC: 3.6 MIL/uL — AB (ref 4.22–5.81)
RDW: 16.2 % — ABNORMAL HIGH (ref 11.5–15.5)
WBC: 4.6 10*3/uL (ref 4.0–10.5)

## 2016-05-28 LAB — PROTIME-INR
INR: 1.03
PROTHROMBIN TIME: 13.5 s (ref 11.4–15.2)

## 2016-05-28 LAB — TROPONIN I

## 2016-05-28 LAB — MAGNESIUM: MAGNESIUM: 2 mg/dL (ref 1.7–2.4)

## 2016-05-28 MED ORDER — SODIUM CHLORIDE 0.9 % IV SOLN
1.0000 g | Freq: Once | INTRAVENOUS | Status: AC
  Administered 2016-05-28: 1 g via INTRAVENOUS
  Filled 2016-05-28: qty 10

## 2016-05-28 NOTE — ED Notes (Signed)
Put PT on Zoll pads. Verbal order by Levada Dy

## 2016-05-28 NOTE — ED Notes (Signed)
Pt did not need anything at this time  

## 2016-05-28 NOTE — ED Notes (Signed)
Pt refused IV, calcium and labs via interpreter, Dr. Kathrynn Humble notified and will see the pt.

## 2016-05-28 NOTE — ED Notes (Signed)
Pt stable, states understanding of discharge instructions, interpreter used.

## 2016-05-28 NOTE — ED Notes (Signed)
PT did not have to use RR at this time  

## 2016-05-28 NOTE — Discharge Instructions (Signed)
Return to the ER if the bleeding starts again.

## 2016-05-28 NOTE — ED Provider Notes (Signed)
[place] DEPT Provider Note   CSN: [id number] Arrival date & time: 05/28/16  1147     History   Chief Complaint No chief complaint on file.   HPI Alvin Daniels is a 46 y.o. male.  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 Pt started having bleeding as his HD was being completed. Pt has a R sided tunneled cath that started bleeding. Tunneled cath was placed a month ago. Pt is not on blood thinner or aspirin. Bleeding has stopped by the time pt got to the ER.  Past Medical History:  Diagnosis Date  . AIDS ([place]) 11/22/2014  . Chronic diarrhea   . Chronic hepatitis C without hepatic coma ([place]) 11/22/2014  . Diabetic neuropathy ([place])   . ESRD (end stage renal disease) on dialysis [facility])    "TTS; don't remember street name" (05/03/2014)  . Hepatitis C   . HIV INFECTION 06/27/2010   Qualifier: Diagnosis of  By: Nickola Major CMA ( [place]), Geni Bers    . Hypotension 06/02/2012  . Metabolic bone disease 12/13/9935  . MRSA infection   . Normocytic anemia 06/17/2012  . Pancreatitis   . Pressure ulcer of BKA stump, stage 2 ([place]) 11/22/2014  . Renal disorder   . Severe protein-calorie malnutrition ([place]) 06/17/2012  . Uncontrolled diabetes mellitus with complications ([place]) 1/69/6789   Annotation: uncontrolled Qualifier: Diagnosis of  By: Nickola Major CMA Deborra Medina), Jacqueline      Patient Active Problem List   Diagnosis Date Noted  . Hyperglycemia 05/24/2016  . Hypoglycemia 05/24/2016  . GERD (gastroesophageal reflux disease) 05/24/2016  . Hypokalemia 05/24/2016  . Acute encephalopathy 05/24/2016  . Burn 04/10/2016  . Hypothermia 04/02/2016  . Colitis   . Shock ([place]) 03/03/2016  . Pressure ulcer of BKA stump, stage 2 ([place]) 11/22/2014  . Chronic hepatitis C without hepatic coma ([place]) 11/22/2014  . AIDS ([employer]) 11/22/2014  . Below knee amputation status ([place]) 06/17/2014  . Diabetic osteomyelitis ([name])   . Hepatitis C virus infection without hepatic  coma   . Blood poisoning ([place])   . Poor dentition 10/25/2013  . ESRD on dialysis ([place]) 10/25/2013  . Vision changes 06/21/2013  . Abdominal pain 04/19/2013  . Orthostatic hypotension 04/15/2013  . Orthostatic headache 04/15/2013  . Anemia of chronic renal failure 06/19/2012  . Headache(784.0) 06/18/2012  . Metabolic bone disease 38/02/1750  . Chronic diarrhea 06/17/2012  . Severe protein-calorie malnutrition ([place]) 06/17/2012  . Normocytic anemia 06/17/2012  . Syncope and collapse 06/02/2012  . Hypotension 06/02/2012  . Human immunodeficiency virus (HIV) disease ([place]) 06/27/2010  . RENAL FAILURE, END STAGE 06/27/2010  . Polyneuropathy in diabetes(357.2) 06/27/2010  . Diabetes mellitus with ESRD (end-stage renal disease) ([place]) 01/03/2010    Past Surgical History:  Procedure Laterality Date  . AMPUTATION Left 04/20/2014   Procedure: 3rd toe amputation, 4th Toe Amputation,  5th Toe Amputation;  Surgeon: Newt Minion, MD;  Location: [place];  Service: Orthopedics;  Laterality: Left;  . AMPUTATION Left 05/02/2014   Procedure: Midfoot Amputation;  Surgeon: Newt Minion, MD;  Location: [place];  Service: Orthopedics;  Laterality: Left;  . AMPUTATION Left 06/17/2014   Procedure: AMPUTATION BELOW KNEE;  Surgeon: Newt Minion, MD;  Location: [place];  Service: Orthopedics;  Laterality: Left;  . AV FISTULA PLACEMENT Left   . AV FISTULA PLACEMENT Left 05/10/2016   Procedure: Creation Left Arm Brachiocephalic Arteriovenous Fistula and Ligation of Radiocephalic Fistula;  Surgeon: Judeth Cornfield  Scot Dock, MD;  Location: [place];  Service: Vascular;  Laterality: Left;  . FOOT AMPUTATION THROUGH ANKLE Left 12/'21/2015   midfoot  . IR GENERIC HISTORICAL Left 05/01/2016   IR THROMBECTOMY AV FISTULA W/THROMBOLYSIS/PTA INC/SHUNT/IMG LEFT 05/01/2016 [name], MD [facility]  . IR GENERIC HISTORICAL  05/01/2016   IR US GUIDE VASC ACCESS LEFT 05/01/2016 [name], MD [facility]  . IR GENERIC  HISTORICAL  05/07/2016   IR FLUORO GUIDE CV LINE RIGHT 05/07/2016 Corrie Mckusick, DO [facility]  . IR GENERIC HISTORICAL  05/07/2016   IR US GUIDE VASC ACCESS RIGHT 05/07/2016 Corrie Mckusick, DO [facility]  . IR GENERIC HISTORICAL  05/22/2016   IR US GUIDE VASC ACCESS RIGHT 05/22/2016 Greggory Keen, MD [facility]  . IR GENERIC HISTORICAL  05/22/2016   IR FLUORO GUIDE CV LINE RIGHT 05/22/2016 Greggory Keen, MD [facility]  . PERIPHERAL VASCULAR CATHETERIZATION Left 05/09/2016   Procedure: A/V Fistulagram;  Surgeon: Angelia Mould, MD;  Location: [place] CV [facility];  Service: Cardiovascular;  Laterality: Left;  arm       Home Medications    Prior to Admission medications   Medication Sig Start Date End Date Taking? Authorizing Provider  lamiVUDine (EPIVIR) 10 MG/ML solution Take 2.5 mLs (25 mg total) by mouth daily. 04/04/16  Yes Bonnielee Haff, MD  midodrine (PROAMATINE) 10 MG tablet Take 1 tablet (10 mg total) by mouth 3 (three) times daily with meals. 04/24/16  Yes [name], MD  multivitamin (RENA-VIT) TABS tablet Take 1 tablet by mouth at bedtime. 03/06/16  Yes Marijean Heath, NP  omeprazole (PRILOSEC) 40 MG capsule TAKE 1 CAPSULE BY MOUTH EVERY DAY 04/24/16  Yes [name], MD  tenofovir (VIREAD) 300 MG tablet TAKE 1 TABLET BY MOUTH ONCE A WEEK ON SUNDAY 02/23/16  Yes Truman Hayward, MD  TIVICAY 50 MG tablet TAKE 1 TABLET BY MOUTH EVERY DAY 02/23/16  Yes Truman Hayward, MD    Family History Family History  Problem Relation Age of Onset  . Diabetes Mother   . Diabetes Father     Social History Social History  Substance Use Topics  . Smoking status: Never Smoker  . Smokeless tobacco: Never Used  . Alcohol use No     Allergies   Patient has no known allergies.   Review of Systems Review of Systems  Constitutional: Negative for activity change and appetite change.  Respiratory: Negative for cough and shortness of breath.     Cardiovascular: Negative for chest pain.  Gastrointestinal: Negative for abdominal pain.  Genitourinary: Negative for dysuria.  Hematological: Does not bruise/bleed easily.     Physical Exam Updated Vital Signs BP 108/72   Pulse 82   Temp 97.6 F (36.4 C) (Oral)   Resp 18   SpO2 100%   Physical Exam  Constitutional: He is oriented to person, place, and time. He appears well-developed.  HENT:  Head: Atraumatic.  Neck: Neck supple.  Cardiovascular: Normal rate.   Pulmonary/Chest: Effort normal.  L sided tunneled cath - dry blood seen on the gauze. No active bleeding. Catheter is not loose.  Neurological: He is alert and oriented to person, place, and time.  Skin: Skin is warm.  Nursing note and vitals reviewed.    ED Treatments / Results  Labs (all labs ordered are listed, but only abnormal results are displayed) Labs Reviewed  CBC WITH DIFFERENTIAL/PLATELET - Abnormal; Notable for the following:       Result  Value   RBC 3.60 (*)    Hemoglobin 11.1 (*)    HCT 34.1 (*)    RDW 16.2 (*)    All other components within normal limits  I-STAT CHEM 8, ED - Abnormal; Notable for the following:    Potassium 3.2 (*)    Chloride 94 (*)    BUN 3 (*)    Creatinine, Ser 3.40 (*)    Calcium, Ion 0.90 (*)    Hemoglobin 12.2 (*)    HCT 36.0 (*)    All other components within normal limits  I-STAT CHEM 8, ED - Abnormal; Notable for the following:    Potassium 3.2 (*)    Chloride 94 (*)    BUN 3 (*)    Creatinine, Ser 3.40 (*)    Calcium, Ion 0.90 (*)    Hemoglobin 12.2 (*)    HCT 36.0 (*)    All other components within normal limits  MAGNESIUM  TROPONIN I  PROTIME-INR    EKG  EKG Interpretation  Date/Time:  Tuesday May 28 2016 13:06:27 EST Ventricular Rate:  81 PR Interval:    QRS Duration: 155 QT Interval:  459 QTC Calculation: 533 R Axis:   0 Text Interpretation:  Atrial fibrillation Paired ventricular premature complexes Nonspecific intraventricular  conduction delay Inferior infarct, acute (LCx) Probable anterolateral infarct, old motion related artifact Confirmed by Kathrynn Humble, MD, Thelma Comp [phone]) on 05/28/2016 3:55:14 PM       Radiology No results found.  Procedures Procedures (including critical care time)  Medications Ordered in ED Medications  calcium gluconate 1 g in sodium chloride 0.9 % 100 mL IVPB (0 g Intravenous Stopped 05/28/16 1524)     Initial Impression / Assessment and Plan / ED Course  I have reviewed the triage vital signs and the nursing notes.  Pertinent labs & imaging results that were available during my care of the patient were reviewed by me and considered in my medical decision making (see chart for details).  Clinical Course     Pt comes in with HD site bleeding, which stopped on it's own. I applied combat gauze dressing. Ni signs of infection. Return precautions and dressing care discussed. Interpreter utilized.    Final Clinical Impressions(s) / ED Diagnoses   Final diagnoses:  Bleeding from dialysis shunt, initial encounter [facility])    New Prescriptions New Prescriptions   No medications on file     Varney Biles, MD 05/28/16 1619

## 2016-05-28 NOTE — ED Triage Notes (Signed)
Pt arrived via [facility] from Dialysis center, right catheter site bleeding at dialysis pt completed entire 4 hour treatment.  Denies any pain.

## 2016-05-28 NOTE — ED Notes (Signed)
Dr. Nanavati at bedside 

## 2016-06-05 ENCOUNTER — Ambulatory Visit: Payer: Self-pay | Admitting: Family Medicine

## 2016-07-05 ENCOUNTER — Ambulatory Visit: Payer: Medicaid Other | Admitting: Internal Medicine

## 2016-07-09 ENCOUNTER — Encounter: Payer: Self-pay | Admitting: Internal Medicine

## 2016-07-09 ENCOUNTER — Encounter: Payer: Self-pay | Admitting: Family Medicine

## 2016-07-10 ENCOUNTER — Telehealth: Payer: Self-pay | Admitting: *Deleted

## 2016-07-10 ENCOUNTER — Other Ambulatory Visit: Payer: Self-pay | Admitting: Infectious Disease

## 2016-07-10 DIAGNOSIS — B20 Human immunodeficiency virus [HIV] disease: Secondary | ICD-10-CM

## 2016-07-10 NOTE — Telephone Encounter (Signed)
Can he come back and have labs perhaps his CD4 is above 200

## 2016-07-10 NOTE — Telephone Encounter (Signed)
Patient is requesting refill of bactrim 400-80, 1 tab daily. THis is not on his active medication list, patient is active with dialysis. Please advise. Landis Gandy, RN

## 2016-07-12 ENCOUNTER — Other Ambulatory Visit: Payer: Self-pay | Admitting: Infectious Disease

## 2016-07-12 DIAGNOSIS — B20 Human immunodeficiency virus [HIV] disease: Secondary | ICD-10-CM

## 2016-07-15 ENCOUNTER — Other Ambulatory Visit: Payer: Self-pay | Admitting: Infectious Disease

## 2016-07-15 DIAGNOSIS — B20 Human immunodeficiency virus [HIV] disease: Secondary | ICD-10-CM

## 2016-07-24 ENCOUNTER — Other Ambulatory Visit (INDEPENDENT_AMBULATORY_CARE_PROVIDER_SITE_OTHER): Payer: Self-pay

## 2016-07-24 DIAGNOSIS — Z113 Encounter for screening for infections with a predominantly sexual mode of transmission: Secondary | ICD-10-CM

## 2016-07-24 DIAGNOSIS — B20 Human immunodeficiency virus [HIV] disease: Secondary | ICD-10-CM

## 2016-07-24 LAB — CBC WITH DIFFERENTIAL/PLATELET
BASOS ABS: 110 {cells}/uL (ref 0–200)
Basophils Relative: 2 %
EOS PCT: 10 %
Eosinophils Absolute: 550 cells/uL — ABNORMAL HIGH (ref 15–500)
HCT: 35.5 % — ABNORMAL LOW (ref 38.5–50.0)
HEMOGLOBIN: 11.7 g/dL — AB (ref 13.2–17.1)
LYMPHS ABS: 1870 {cells}/uL (ref 850–3900)
Lymphocytes Relative: 34 %
MCH: 33.1 pg — AB (ref 27.0–33.0)
MCHC: 33 g/dL (ref 32.0–36.0)
MCV: 100.3 fL — ABNORMAL HIGH (ref 80.0–100.0)
MPV: 9 fL (ref 7.5–12.5)
Monocytes Absolute: 495 cells/uL (ref 200–950)
Monocytes Relative: 9 %
NEUTROS PCT: 45 %
Neutro Abs: 2475 cells/uL (ref 1500–7800)
Platelets: 230 10*3/uL (ref 140–400)
RBC: 3.54 MIL/uL — ABNORMAL LOW (ref 4.20–5.80)
RDW: 15.9 % — ABNORMAL HIGH (ref 11.0–15.0)
WBC: 5.5 10*3/uL (ref 3.8–10.8)

## 2016-07-24 LAB — GLUCOSE, CAPILLARY: Glucose-Capillary: 200 mg/dL — ABNORMAL HIGH (ref 65–99)

## 2016-07-25 LAB — T-HELPER CELL (CD4) - (RCID CLINIC ONLY)
CD4 % Helper T Cell: 17 % — ABNORMAL LOW (ref 33–55)
CD4 T Cell Abs: 350 /uL — ABNORMAL LOW (ref 400–2700)

## 2016-07-25 LAB — RPR

## 2016-07-26 LAB — HIV-1 RNA QUANT-NO REFLEX-BLD
HIV 1 RNA Quant: <20 {copies}/mL
HIV-1 RNA Quant, Log: <1.3 {Log_copies}/mL

## 2016-08-06 ENCOUNTER — Other Ambulatory Visit: Payer: Self-pay | Admitting: Infectious Disease

## 2016-08-06 DIAGNOSIS — B20 Human immunodeficiency virus [HIV] disease: Secondary | ICD-10-CM

## 2016-08-12 ENCOUNTER — Other Ambulatory Visit: Payer: Self-pay | Admitting: *Deleted

## 2016-08-12 DIAGNOSIS — T82510D Breakdown (mechanical) of surgically created arteriovenous fistula, subsequent encounter: Secondary | ICD-10-CM

## 2016-08-13 ENCOUNTER — Other Ambulatory Visit: Payer: Self-pay | Admitting: *Deleted

## 2016-08-13 ENCOUNTER — Other Ambulatory Visit: Payer: Self-pay | Admitting: Infectious Disease

## 2016-08-13 DIAGNOSIS — B20 Human immunodeficiency virus [HIV] disease: Secondary | ICD-10-CM

## 2016-08-13 MED ORDER — TENOFOVIR DISOPROXIL FUMARATE 300 MG PO TABS: ORAL_TABLET | ORAL | 1 refills | Status: DC

## 2016-08-13 MED ORDER — LAMIVUDINE 10 MG/ML PO SOLN: 25.0000 mg | Freq: Every day | ORAL | 1 refills | Status: DC

## 2016-08-14 ENCOUNTER — Ambulatory Visit (HOSPITAL_COMMUNITY)
Admission: RE | Admit: 2016-08-14 | Discharge: 2016-08-14 | Disposition: A | Discharge status: Home / Self Care | Payer: Self-pay | Source: Ambulatory Visit | Attending: Vascular Surgery | Admitting: Vascular Surgery

## 2016-08-14 ENCOUNTER — Ambulatory Visit (INDEPENDENT_AMBULATORY_CARE_PROVIDER_SITE_OTHER): Payer: Self-pay | Admitting: Vascular Surgery

## 2016-08-14 VITALS — BP 65/37 | HR 95 | Temp 98.5°F | Resp 16 | Ht 64.0 in | Wt 138.0 lb

## 2016-08-14 DIAGNOSIS — T82510D Breakdown (mechanical) of surgically created arteriovenous fistula, subsequent encounter: Secondary | ICD-10-CM | POA: Insufficient documentation

## 2016-08-14 DIAGNOSIS — Z992 Dependence on renal dialysis: Secondary | ICD-10-CM

## 2016-08-14 DIAGNOSIS — N186 End stage renal disease: Secondary | ICD-10-CM

## 2016-08-14 DIAGNOSIS — X58XXXD Exposure to other specified factors, subsequent encounter: Secondary | ICD-10-CM | POA: Insufficient documentation

## 2016-08-14 NOTE — Progress Notes (Signed)
Vascular and Vein Specialist of [facility]  Patient name: Quantez Schnyder MRN: [id number] DOB: [birth year] Sex: male  REASON FOR VISIT: Rule out steal syndrome  HPI: Cordarro Ogas is a 46 y.o. male seen today to rule out steal syndrome. He is here today with a Spanish interpreter. He struck his left fifth finger on his wheelchair and reports that he has had slow healing of this. He had a long-standing left radiocephalic fistula which was unsalvageable. On 05/10/2016 he had placement of new left upper arm fistula with Dr. Scot Dock. This is been used on several occasions and has had excellent maturation. He does still continue to have a right IJ hemodialysis catheter. He reports that he had some discomfort in his finger initially but this is improved and he specifically denies any pain awakened him from night and also denies any pain in his hand with hemodialysis. He occasionally does have pain in the ulceration over his lateral fifth finger. His main complaint today is of pain in his left knee and thigh. He has a left below-knee amputation and had a recent fall on his knee.  Past Medical History:  Diagnosis Date  . AIDS ([place]) 11/22/2014  . Chronic diarrhea   . Chronic hepatitis C without hepatic coma ([place]) 11/22/2014  . Diabetic neuropathy ([place])   . ESRD (end stage renal disease) on dialysis [facility])    "TTS; don't remember street name" (05/03/2014)  . Hepatitis C   . HIV INFECTION 06/27/2010   Qualifier: Diagnosis of  By: Nickola Major CMA ( [place]), Geni Bers    . Hypotension 06/02/2012  . Metabolic bone disease 09/15/3873  . MRSA infection   . Normocytic anemia 06/17/2012  . Pancreatitis   . Pressure ulcer of BKA stump, stage 2 ([place]) 11/22/2014  . Renal disorder   . Severe protein-calorie malnutrition ([place]) 06/17/2012  . Uncontrolled diabetes mellitus with complications ([place]) 6/43/3295   Annotation: uncontrolled Qualifier: Diagnosis of  By: Nickola Major CMA  ( AAMA), Geni Bers      Family History  Problem Relation Age of Onset  . Diabetes Mother   . Diabetes Father     SOCIAL HISTORY: Social History  Substance Use Topics  . Smoking status: Never Smoker  . Smokeless tobacco: Never Used  . Alcohol use No    No Known Allergies  Current Outpatient Prescriptions  Medication Sig Dispense Refill  . midodrine (PROAMATINE) 10 MG tablet Take 1 tablet (10 mg total) by mouth 3 (three) times daily with meals. 90 tablet 3  . multivitamin (RENA-VIT) TABS tablet Take 1 tablet by mouth at bedtime.  0  . omeprazole (PRILOSEC) 40 MG capsule TAKE 1 CAPSULE BY MOUTH EVERY DAY 30 capsule 3  . sulfamethoxazole-trimethoprim (BACTRIM,SEPTRA) 400-80 MG tablet TAKE 1 TABLET BY MOUTH DAILY 30 tablet 1  . TIVICAY 50 MG tablet TAKE 1 TABLET BY MOUTH DAILY 30 tablet 1  . tenofovir (VIREAD) 300 MG tablet TAKE 1 TABLET BY MOUTH ONCE WEEKLY ON SUNDAY (Patient not taking: Reported on 08/14/2016) 5 tablet 1   No current facility-administered medications for this visit.     REVIEW OF SYSTEMS:  [X]  denotes positive finding, [ ]  denotes negative finding Cardiac  Comments:  Chest pain or chest pressure:    Shortness of breath upon exertion:    Short of breath when lying flat:    Irregular heart rhythm:        Vascular    Pain in calf, thigh, or hip brought on by ambulation:    Pain  in feet at night that wakes you up from your sleep:     Blood clot in your veins:    Leg swelling:           PHYSICAL EXAM: Vitals:   08/14/16 1018  BP: (!) 65/37  Pulse: 95  Resp: 16  Temp: 98.5 F (36.9 C)  TempSrc: Oral  SpO2: 100%  Weight: 138 lb (62.6 kg)  Height: 5\' 4"  (1.626 m)    GENERAL: The patient is a well-nourished male, in no acute distress. The vital signs are documented above. CARDIOVASCULAR: Excellent maturation of his upper arm fistula. He does have a radial pulse on the left. PULMONARY: There is good air exchange  MUSCULOSKELETAL: There are no major  deformities or cyanosis. Does have some erythema and his left knee. No tenderness in his below-knee amputation stump itself. NEUROLOGIC: No focal weakness or paresthesias are detected. SKIN: There are no ulcers or rashes noted. PSYCHIATRIC: The patient has a normal affect.  DATA:  He did undergo noninvasive vascular studies in our office and did have increase in his wrist pressure from approximate 90 mmHg 205 mmHg with compression.  MEDICAL ISSUES: Clinically there is no evidence of steal. Ulceration on his finger appears to be healing and was related to trauma. This does not appear to be an ischemic ulcer. He was reassured with this discussion will see [place] again on as-needed basis. If he continues to have difficulty with his left knee after his fall he is encouraged to seek attention and possible x-ray to rule out any other orthopedic issues    Rosetta Posner, MD [facility] Tel [phone] Pager [phone]

## 2016-08-15 ENCOUNTER — Emergency Department (HOSPITAL_COMMUNITY): Payer: Medicaid Other

## 2016-08-15 ENCOUNTER — Inpatient Hospital Stay (HOSPITAL_COMMUNITY)
Admission: EM | Admit: 2016-08-15 | Discharge: 2016-08-22 | DRG: 480 | Disposition: A | Discharge status: Home / Self Care | Payer: Medicaid Other | Attending: Internal Medicine | Admitting: Internal Medicine

## 2016-08-15 ENCOUNTER — Encounter (HOSPITAL_COMMUNITY): Payer: Self-pay

## 2016-08-15 DIAGNOSIS — S7292XA Unspecified fracture of left femur, initial encounter for closed fracture: Secondary | ICD-10-CM

## 2016-08-15 DIAGNOSIS — B20 Human immunodeficiency virus [HIV] disease: Secondary | ICD-10-CM | POA: Diagnosis present

## 2016-08-15 DIAGNOSIS — D631 Anemia in chronic kidney disease: Secondary | ICD-10-CM | POA: Diagnosis present

## 2016-08-15 DIAGNOSIS — D62 Acute posthemorrhagic anemia: Secondary | ICD-10-CM | POA: Diagnosis not present

## 2016-08-15 DIAGNOSIS — B182 Chronic viral hepatitis C: Secondary | ICD-10-CM | POA: Diagnosis present

## 2016-08-15 DIAGNOSIS — E1122 Type 2 diabetes mellitus with diabetic chronic kidney disease: Secondary | ICD-10-CM | POA: Diagnosis present

## 2016-08-15 DIAGNOSIS — Z21 Asymptomatic human immunodeficiency virus [HIV] infection status: Secondary | ICD-10-CM | POA: Diagnosis present

## 2016-08-15 DIAGNOSIS — Z833 Family history of diabetes mellitus: Secondary | ICD-10-CM

## 2016-08-15 DIAGNOSIS — I120 Hypertensive chronic kidney disease with stage 5 chronic kidney disease or end stage renal disease: Secondary | ICD-10-CM | POA: Diagnosis present

## 2016-08-15 DIAGNOSIS — M898X9 Other specified disorders of bone, unspecified site: Secondary | ICD-10-CM | POA: Diagnosis present

## 2016-08-15 DIAGNOSIS — Z79899 Other long term (current) drug therapy: Secondary | ICD-10-CM

## 2016-08-15 DIAGNOSIS — T148XXA Other injury of unspecified body region, initial encounter: Secondary | ICD-10-CM

## 2016-08-15 DIAGNOSIS — W010XXA Fall on same level from slipping, tripping and stumbling without subsequent striking against object, initial encounter: Secondary | ICD-10-CM | POA: Diagnosis present

## 2016-08-15 DIAGNOSIS — Z89512 Acquired absence of left leg below knee: Secondary | ICD-10-CM

## 2016-08-15 DIAGNOSIS — E876 Hypokalemia: Secondary | ICD-10-CM | POA: Diagnosis present

## 2016-08-15 DIAGNOSIS — E1121 Type 2 diabetes mellitus with diabetic nephropathy: Secondary | ICD-10-CM | POA: Diagnosis present

## 2016-08-15 DIAGNOSIS — Z992 Dependence on renal dialysis: Secondary | ICD-10-CM

## 2016-08-15 DIAGNOSIS — K219 Gastro-esophageal reflux disease without esophagitis: Secondary | ICD-10-CM | POA: Diagnosis present

## 2016-08-15 DIAGNOSIS — N2581 Secondary hyperparathyroidism of renal origin: Secondary | ICD-10-CM | POA: Diagnosis present

## 2016-08-15 DIAGNOSIS — N186 End stage renal disease: Secondary | ICD-10-CM

## 2016-08-15 DIAGNOSIS — N189 Chronic kidney disease, unspecified: Secondary | ICD-10-CM

## 2016-08-15 DIAGNOSIS — I950 Idiopathic hypotension: Secondary | ICD-10-CM

## 2016-08-15 DIAGNOSIS — D696 Thrombocytopenia, unspecified: Secondary | ICD-10-CM | POA: Diagnosis present

## 2016-08-15 DIAGNOSIS — S72452A Displaced supracondylar fracture without intracondylar extension of lower end of left femur, initial encounter for closed fracture: Principal | ICD-10-CM | POA: Diagnosis present

## 2016-08-15 LAB — BASIC METABOLIC PANEL
ANION GAP: 12 (ref 5–15)
BUN: 26 mg/dL — ABNORMAL HIGH (ref 6–20)
CO2: 28 mmol/L (ref 22–32)
Calcium: 7.8 mg/dL — ABNORMAL LOW (ref 8.9–10.3)
Chloride: 94 mmol/L — ABNORMAL LOW (ref 101–111)
Creatinine, Ser: 5.7 mg/dL — ABNORMAL HIGH (ref 0.61–1.24)
GFR calc Af Amer: 13 mL/min — ABNORMAL LOW (ref >60)
GFR, EST NON AFRICAN AMERICAN: 11 mL/min — AB (ref >60)
Glucose, Bld: 195 mg/dL — ABNORMAL HIGH (ref 65–99)
POTASSIUM: 3.1 mmol/L — AB (ref 3.5–5.1)
SODIUM: 134 mmol/L — AB (ref 135–145)

## 2016-08-15 LAB — CBC
HEMATOCRIT: 23.9 % — AB (ref 39.0–52.0)
Hemoglobin: 7.9 g/dL — ABNORMAL LOW (ref 13.0–17.0)
MCH: 32.9 pg (ref 26.0–34.0)
MCHC: 33.1 g/dL (ref 30.0–36.0)
MCV: 99.6 fL (ref 78.0–100.0)
Platelets: 169 10*3/uL (ref 150–400)
RBC: 2.4 MIL/uL — ABNORMAL LOW (ref 4.22–5.81)
RDW: 14.2 % (ref 11.5–15.5)
WBC: 9 10*3/uL (ref 4.0–10.5)

## 2016-08-15 LAB — I-STAT CG4 LACTIC ACID, ED: Lactic Acid, Venous: 1.72 mmol/L (ref 0.5–1.9)

## 2016-08-15 LAB — CK: CK TOTAL: 79 U/L (ref 49–397)

## 2016-08-15 LAB — C-REACTIVE PROTEIN: CRP: 28.3 mg/dL — AB (ref <1.0)

## 2016-08-15 MED ORDER — SODIUM CHLORIDE 0.9 % IV BOLUS (SEPSIS): 1000.0000 mL | Freq: Once | INTRAVENOUS | Status: DC

## 2016-08-15 MED ORDER — FENTANYL CITRATE (PF) 100 MCG/2ML IJ SOLN
50.0000 ug | Freq: Once | INTRAMUSCULAR | Status: AC
  Administered 2016-08-15: 50 ug via INTRAVENOUS
  Filled 2016-08-15: qty 2

## 2016-08-15 MED ORDER — SODIUM CHLORIDE 0.9 % IV BOLUS (SEPSIS)
500.0000 mL | Freq: Once | INTRAVENOUS | Status: AC
  Administered 2016-08-15: 500 mL via INTRAVENOUS

## 2016-08-15 NOTE — ED Triage Notes (Signed)
Pt reports falling with his walker Saturday. He is now having left knee pain. Pt has left bka. PT is dialysis pt and last went dialysis today. He is found to be hypotensive in triage. Pt reports that he has been weak and is worried he may have bone infection in left leg. Video translater in use.

## 2016-08-15 NOTE — ED Notes (Signed)
Called for triage x1.
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
## 2016-08-16 DIAGNOSIS — D62 Acute posthemorrhagic anemia: Secondary | ICD-10-CM | POA: Diagnosis not present

## 2016-08-16 DIAGNOSIS — Z79899 Other long term (current) drug therapy: Secondary | ICD-10-CM | POA: Diagnosis not present

## 2016-08-16 DIAGNOSIS — S72452A Displaced supracondylar fracture without intracondylar extension of lower end of left femur, initial encounter for closed fracture: Secondary | ICD-10-CM | POA: Diagnosis present

## 2016-08-16 DIAGNOSIS — B20 Human immunodeficiency virus [HIV] disease: Secondary | ICD-10-CM | POA: Diagnosis present

## 2016-08-16 DIAGNOSIS — D696 Thrombocytopenia, unspecified: Secondary | ICD-10-CM | POA: Diagnosis present

## 2016-08-16 DIAGNOSIS — M898X9 Other specified disorders of bone, unspecified site: Secondary | ICD-10-CM | POA: Diagnosis present

## 2016-08-16 DIAGNOSIS — Z21 Asymptomatic human immunodeficiency virus [HIV] infection status: Secondary | ICD-10-CM | POA: Diagnosis present

## 2016-08-16 DIAGNOSIS — I120 Hypertensive chronic kidney disease with stage 5 chronic kidney disease or end stage renal disease: Secondary | ICD-10-CM | POA: Diagnosis present

## 2016-08-16 DIAGNOSIS — K219 Gastro-esophageal reflux disease without esophagitis: Secondary | ICD-10-CM | POA: Diagnosis present

## 2016-08-16 DIAGNOSIS — S7292XA Unspecified fracture of left femur, initial encounter for closed fracture: Secondary | ICD-10-CM | POA: Diagnosis present

## 2016-08-16 DIAGNOSIS — E876 Hypokalemia: Secondary | ICD-10-CM | POA: Diagnosis present

## 2016-08-16 DIAGNOSIS — D631 Anemia in chronic kidney disease: Secondary | ICD-10-CM | POA: Diagnosis present

## 2016-08-16 DIAGNOSIS — S72415A Nondisplaced unspecified condyle fracture of lower end of left femur, initial encounter for closed fracture: Secondary | ICD-10-CM

## 2016-08-16 DIAGNOSIS — E1121 Type 2 diabetes mellitus with diabetic nephropathy: Secondary | ICD-10-CM | POA: Diagnosis present

## 2016-08-16 DIAGNOSIS — Z992 Dependence on renal dialysis: Secondary | ICD-10-CM | POA: Diagnosis not present

## 2016-08-16 DIAGNOSIS — Z833 Family history of diabetes mellitus: Secondary | ICD-10-CM | POA: Diagnosis not present

## 2016-08-16 DIAGNOSIS — Z89512 Acquired absence of left leg below knee: Secondary | ICD-10-CM | POA: Diagnosis not present

## 2016-08-16 DIAGNOSIS — N2581 Secondary hyperparathyroidism of renal origin: Secondary | ICD-10-CM | POA: Diagnosis present

## 2016-08-16 DIAGNOSIS — E1122 Type 2 diabetes mellitus with diabetic chronic kidney disease: Secondary | ICD-10-CM | POA: Diagnosis present

## 2016-08-16 DIAGNOSIS — B182 Chronic viral hepatitis C: Secondary | ICD-10-CM | POA: Diagnosis present

## 2016-08-16 DIAGNOSIS — I950 Idiopathic hypotension: Secondary | ICD-10-CM | POA: Diagnosis present

## 2016-08-16 DIAGNOSIS — W010XXA Fall on same level from slipping, tripping and stumbling without subsequent striking against object, initial encounter: Secondary | ICD-10-CM | POA: Diagnosis present

## 2016-08-16 DIAGNOSIS — N186 End stage renal disease: Secondary | ICD-10-CM | POA: Diagnosis present

## 2016-08-16 LAB — CBC
HEMATOCRIT: 22.7 % — AB (ref 39.0–52.0)
HEMATOCRIT: 21.9 % — AB (ref 39.0–52.0)
HEMATOCRIT: 21.9 % — AB (ref 39.0–52.0)
HEMOGLOBIN: 7.1 g/dL — AB (ref 13.0–17.0)
HEMOGLOBIN: 7 g/dL — AB (ref 13.0–17.0)
Hemoglobin: 7.5 g/dL — ABNORMAL LOW (ref 13.0–17.0)
MCH: 33 pg (ref 26.0–34.0)
MCH: 32.6 pg (ref 26.0–34.0)
MCH: 32.4 pg (ref 26.0–34.0)
MCHC: 33 g/dL (ref 30.0–36.0)
MCHC: 32.4 g/dL (ref 30.0–36.0)
MCHC: 32 g/dL (ref 30.0–36.0)
MCV: 100 fL (ref 78.0–100.0)
MCV: 100.5 fL — ABNORMAL HIGH (ref 78.0–100.0)
MCV: 101.4 fL — AB (ref 78.0–100.0)
PLATELETS: 135 10*3/uL — AB (ref 150–400)
Platelets: 134 10*3/uL — ABNORMAL LOW (ref 150–400)
Platelets: 142 10*3/uL — ABNORMAL LOW (ref 150–400)
RBC: 2.27 MIL/uL — ABNORMAL LOW (ref 4.22–5.81)
RBC: 2.18 MIL/uL — ABNORMAL LOW (ref 4.22–5.81)
RBC: 2.16 MIL/uL — AB (ref 4.22–5.81)
RDW: 14.1 % (ref 11.5–15.5)
RDW: 14.3 % (ref 11.5–15.5)
RDW: 14.2 % (ref 11.5–15.5)
WBC: 8.1 10*3/uL (ref 4.0–10.5)
WBC: 7.7 10*3/uL (ref 4.0–10.5)
WBC: 7.1 10*3/uL (ref 4.0–10.5)

## 2016-08-16 LAB — GLUCOSE, CAPILLARY
GLUCOSE-CAPILLARY: 134 mg/dL — AB (ref 65–99)
GLUCOSE-CAPILLARY: 273 mg/dL — AB (ref 65–99)
Glucose-Capillary: 143 mg/dL — ABNORMAL HIGH (ref 65–99)

## 2016-08-16 LAB — BASIC METABOLIC PANEL
Anion gap: 13 (ref 5–15)
BUN: 27 mg/dL — AB (ref 6–20)
CALCIUM: 7.6 mg/dL — AB (ref 8.9–10.3)
CO2: 26 mmol/L (ref 22–32)
Chloride: 98 mmol/L — ABNORMAL LOW (ref 101–111)
Creatinine, Ser: 5.78 mg/dL — ABNORMAL HIGH (ref 0.61–1.24)
GFR calc Af Amer: 12 mL/min — ABNORMAL LOW (ref >60)
GFR, EST NON AFRICAN AMERICAN: 11 mL/min — AB (ref >60)
GLUCOSE: 183 mg/dL — AB (ref 65–99)
Potassium: 3.3 mmol/L — ABNORMAL LOW (ref 3.5–5.1)
Sodium: 137 mmol/L (ref 135–145)

## 2016-08-16 LAB — APTT: aPTT: 45 seconds — ABNORMAL HIGH (ref 24–36)

## 2016-08-16 LAB — MAGNESIUM: Magnesium: 2.2 mg/dL (ref 1.7–2.4)

## 2016-08-16 LAB — PROTIME-INR
INR: 1.27
Prothrombin Time: 16 seconds — ABNORMAL HIGH (ref 11.4–15.2)

## 2016-08-16 LAB — MRSA PCR SCREENING: MRSA by PCR: NEGATIVE

## 2016-08-16 LAB — CBG MONITORING, ED: Glucose-Capillary: 168 mg/dL — ABNORMAL HIGH (ref 65–99)

## 2016-08-16 LAB — SEDIMENTATION RATE: SED RATE: 132 mm/h — AB (ref 0–16)

## 2016-08-16 MED ORDER — SODIUM CHLORIDE 0.9 % IV BOLUS (SEPSIS)
250.0000 mL | Freq: Once | INTRAVENOUS | Status: AC
  Administered 2016-08-16: 250 mL via INTRAVENOUS

## 2016-08-16 MED ORDER — ONDANSETRON HCL 4 MG PO TABS: 4.0000 mg | ORAL_TABLET | Freq: Four times a day (QID) | ORAL | Status: DC | PRN

## 2016-08-16 MED ORDER — MORPHINE SULFATE (PF) 4 MG/ML IV SOLN: 2.0000 mg | INTRAVENOUS | Status: DC | PRN

## 2016-08-16 MED ORDER — TENOFOVIR DISOPROXIL FUMARATE 300 MG PO TABS
300.0000 mg | ORAL_TABLET | ORAL | Status: DC
  Administered 2016-08-18: 300 mg via ORAL
  Filled 2016-08-16: qty 1

## 2016-08-16 MED ORDER — METHOCARBAMOL 500 MG PO TABS
500.0000 mg | ORAL_TABLET | Freq: Three times a day (TID) | ORAL | Status: DC | PRN
  Administered 2016-08-17: 500 mg via ORAL
  Filled 2016-08-16: qty 1

## 2016-08-16 MED ORDER — FENTANYL CITRATE (PF) 100 MCG/2ML IJ SOLN
50.0000 ug | Freq: Once | INTRAMUSCULAR | Status: AC
  Administered 2016-08-16: 50 ug via INTRAVENOUS
  Filled 2016-08-16: qty 2

## 2016-08-16 MED ORDER — DOLUTEGRAVIR SODIUM 50 MG PO TABS
50.0000 mg | ORAL_TABLET | Freq: Every day | ORAL | Status: DC
  Administered 2016-08-16 – 2016-08-22 (×6): 50 mg via ORAL
  Filled 2016-08-16 (×9): qty 1

## 2016-08-16 MED ORDER — SODIUM CHLORIDE 0.9 % IV SOLN
1500.0000 mg | Freq: Once | INTRAVENOUS | Status: AC
  Administered 2016-08-16: 1500 mg via INTRAVENOUS
  Filled 2016-08-16: qty 1500

## 2016-08-16 MED ORDER — ONDANSETRON HCL 4 MG/2ML IJ SOLN: 4.0000 mg | Freq: Four times a day (QID) | INTRAMUSCULAR | Status: DC | PRN

## 2016-08-16 MED ORDER — CHLORHEXIDINE GLUCONATE 4 % EX LIQD
60.0000 mL | Freq: Once | CUTANEOUS | Status: AC
  Administered 2016-08-17: 4 via TOPICAL
  Filled 2016-08-16: qty 60

## 2016-08-16 MED ORDER — SODIUM CHLORIDE 0.9 % IV BOLUS (SEPSIS)
500.0000 mL | Freq: Once | INTRAVENOUS | Status: AC
  Administered 2016-08-16: 500 mL via INTRAVENOUS

## 2016-08-16 MED ORDER — INSULIN ASPART 100 UNIT/ML ~~LOC~~ SOLN
0.0000 [IU] | Freq: Every day | SUBCUTANEOUS | Status: DC
  Administered 2016-08-16: 3 [IU] via SUBCUTANEOUS
  Administered 2016-08-19: 2 [IU] via SUBCUTANEOUS

## 2016-08-16 MED ORDER — DOXERCALCIFEROL 4 MCG/2ML IV SOLN
2.0000 ug | INTRAVENOUS | Status: DC
  Administered 2016-08-20 – 2016-08-22 (×2): 2 ug via INTRAVENOUS
  Filled 2016-08-16 (×3): qty 2

## 2016-08-16 MED ORDER — NEPRO/CARBSTEADY PO LIQD
237.0000 mL | Freq: Two times a day (BID) | ORAL | Status: DC
  Administered 2016-08-18 – 2016-08-19 (×3): 237 mL via ORAL
  Filled 2016-08-16 (×12): qty 237

## 2016-08-16 MED ORDER — SODIUM CHLORIDE 0.9% FLUSH
3.0000 mL | Freq: Two times a day (BID) | INTRAVENOUS | Status: DC
  Administered 2016-08-16 – 2016-08-21 (×11): 3 mL via INTRAVENOUS

## 2016-08-16 MED ORDER — RENA-VITE PO TABS
1.0000 | ORAL_TABLET | Freq: Every day | ORAL | Status: DC
  Administered 2016-08-16 – 2016-08-21 (×5): 1 via ORAL
  Filled 2016-08-16 (×5): qty 1

## 2016-08-16 MED ORDER — ZOLPIDEM TARTRATE 5 MG PO TABS: 5.0000 mg | ORAL_TABLET | Freq: Every evening | ORAL | Status: DC | PRN

## 2016-08-16 MED ORDER — OXYCODONE-ACETAMINOPHEN 5-325 MG PO TABS
1.0000 | ORAL_TABLET | ORAL | Status: DC | PRN
  Administered 2016-08-16 – 2016-08-22 (×9): 1 via ORAL
  Filled 2016-08-16 (×7): qty 1

## 2016-08-16 MED ORDER — DARBEPOETIN ALFA 200 MCG/0.4ML IJ SOSY
200.0000 ug | PREFILLED_SYRINGE | INTRAMUSCULAR | Status: DC
  Administered 2016-08-17: 200 ug via INTRAVENOUS
  Filled 2016-08-16: qty 0.4

## 2016-08-16 MED ORDER — MIDODRINE HCL 5 MG PO TABS
10.0000 mg | ORAL_TABLET | Freq: Three times a day (TID) | ORAL | Status: DC
  Administered 2016-08-16 – 2016-08-22 (×19): 10 mg via ORAL
  Filled 2016-08-16 (×20): qty 2

## 2016-08-16 MED ORDER — ACETAMINOPHEN 325 MG PO TABS
650.0000 mg | ORAL_TABLET | Freq: Four times a day (QID) | ORAL | Status: DC | PRN
  Administered 2016-08-17: 650 mg via ORAL
  Filled 2016-08-16: qty 2

## 2016-08-16 MED ORDER — INSULIN ASPART 100 UNIT/ML ~~LOC~~ SOLN
0.0000 [IU] | Freq: Three times a day (TID) | SUBCUTANEOUS | Status: DC
  Administered 2016-08-18: 3 [IU] via SUBCUTANEOUS
  Administered 2016-08-18: 1 [IU] via SUBCUTANEOUS
  Administered 2016-08-18 – 2016-08-19 (×2): 3 [IU] via SUBCUTANEOUS
  Administered 2016-08-19 – 2016-08-20 (×2): 2 [IU] via SUBCUTANEOUS
  Administered 2016-08-21: 5 [IU] via SUBCUTANEOUS

## 2016-08-16 MED ORDER — POVIDONE-IODINE 10 % EX SWAB: 2.0000 "application " | Freq: Once | CUTANEOUS | Status: DC

## 2016-08-16 MED ORDER — SODIUM CHLORIDE 0.9 % IV SOLN
INTRAVENOUS | Status: DC
  Administered 2016-08-16: 03:00:00 via INTRAVENOUS

## 2016-08-16 MED ORDER — PANTOPRAZOLE SODIUM 40 MG PO TBEC
40.0000 mg | DELAYED_RELEASE_TABLET | Freq: Every day | ORAL | Status: DC
  Administered 2016-08-16 – 2016-08-22 (×6): 40 mg via ORAL
  Filled 2016-08-16 (×7): qty 1

## 2016-08-16 MED ORDER — LAMIVUDINE 10 MG/ML PO SOLN
25.0000 mg | Freq: Every day | ORAL | Status: DC
  Administered 2016-08-16 – 2016-08-22 (×6): 25 mg via ORAL
  Filled 2016-08-16 (×11): qty 5

## 2016-08-16 MED ORDER — ACETAMINOPHEN 650 MG RE SUPP: 650.0000 mg | Freq: Four times a day (QID) | RECTAL | Status: DC | PRN

## 2016-08-16 MED ORDER — POTASSIUM CHLORIDE 20 MEQ/15ML (10%) PO SOLN
30.0000 meq | Freq: Once | ORAL | Status: AC
  Administered 2016-08-16: 30 meq via ORAL
  Filled 2016-08-16: qty 30

## 2016-08-16 MED ORDER — SULFAMETHOXAZOLE-TRIMETHOPRIM 400-80 MG PO TABS
1.0000 | ORAL_TABLET | Freq: Every day | ORAL | Status: DC
  Administered 2016-08-16 – 2016-08-21 (×4): 1 via ORAL
  Filled 2016-08-16 (×9): qty 1

## 2016-08-16 NOTE — Progress Notes (Addendum)
Per report, pt has chronic low bp.  Baseline systolic around 80 mmhg per report.  Pt also is HD pt with poor vasculature and weak pulses all over.  Cuff BPs are probably inaccurate.  Repositioned BP cuff on right upper ext and received BP 78/51 map 60.  Pt is completely alert without any s/s of hypotension.  Pt seems very stable at this time.

## 2016-08-16 NOTE — Consult Note (Signed)
[facility] Renal Consultation Note    Indication for Consultation:  Management of ESRD/hemodialysis; anemia, hypertension/volume and secondary hyperparathyroidism  HPI: Alvin Daniels is a 46 y.o. male with ESRD on hemodialysis, chronic hypotension,  HIV, Hepatitis C, DM with neuropathy. Patient is admitted for left femur fracture s/p fall at home. X-ray revealed closed, comminuted and impacted supracondylar fracture of the left femur with 1/4 shaft width dorsal displacement of the distal fracture fragments.  Labs significant for hemoglobin 7.9 > 7.5 >7.0 this am. His last outpatient hemoglobin 11.7 on 08/08/16. FOBT pending  Patient is Spanish speaking with limited English. He reports falling at home after walker slipped.Currently seen at bedside alert and in good spirits. He denies SOB, chest pain, vomiting, nausea, diarrhea.  Dialyzes at [facility]. He is chronically hypotensive but unable to afford midodrine as outpatient. Last HD was yesterday 4/5. Minimal UF d/t hypotension and left 1.7kg over his EDW of 48kg. Does not appear volume overloaded on exam.   Past Medical History:  Diagnosis Date  . AIDS ([place]) 11/22/2014  . Chronic diarrhea   . Chronic hepatitis C without hepatic coma ([place]) 11/22/2014  . Diabetic neuropathy ([place])   . ESRD (end stage renal disease) on dialysis [facility])    "TTS; don't remember street name" (05/03/2014)  . Hepatitis C   . HIV INFECTION 06/27/2010   Qualifier: Diagnosis of  By: Nickola Major CMA ( [place]), Geni Bers    . Hypotension 06/02/2012  . Metabolic bone disease 09/12/9765  . MRSA infection   . Normocytic anemia 06/17/2012  . Pancreatitis   . Pressure ulcer of BKA stump, stage 2 ([place]) 11/22/2014  . Renal disorder   . Severe protein-calorie malnutrition ([place]) 06/17/2012  . Uncontrolled diabetes mellitus with complications ([place]) 3/41/9379   Annotation: uncontrolled Qualifier: Diagnosis of  By: Nickola Major CMA Deborra Medina), Geni Bers      Past Surgical History:  Procedure Laterality Date  . AMPUTATION Left 04/20/2014   Procedure: 3rd toe amputation, 4th Toe Amputation,  5th Toe Amputation;  Surgeon: Newt Minion, MD;  Location: [place];  Service: Orthopedics;  Laterality: Left;  . AMPUTATION Left 05/02/2014   Procedure: Midfoot Amputation;  Surgeon: Newt Minion, MD;  Location: [place];  Service: Orthopedics;  Laterality: Left;  . AMPUTATION Left 06/17/2014   Procedure: AMPUTATION BELOW KNEE;  Surgeon: Newt Minion, MD;  Location: [place];  Service: Orthopedics;  Laterality: Left;  . AV FISTULA PLACEMENT Left   . AV FISTULA PLACEMENT Left 05/10/2016   Procedure: Creation Left Arm Brachiocephalic Arteriovenous Fistula and Ligation of Radiocephalic Fistula;  Surgeon: Angelia Mould, MD;  Location: [place];  Service: Vascular;  Laterality: Left;  . FOOT AMPUTATION THROUGH ANKLE Left 12/'21/2015   midfoot  . IR GENERIC HISTORICAL Left 05/01/2016   IR THROMBECTOMY AV FISTULA W/THROMBOLYSIS/PTA INC/SHUNT/IMG LEFT 05/01/2016 [name], MD [facility]  . IR GENERIC HISTORICAL  05/01/2016   IR US GUIDE VASC ACCESS LEFT 05/01/2016 [name], MD [facility]  . IR GENERIC HISTORICAL  05/07/2016   IR FLUORO GUIDE CV LINE RIGHT 05/07/2016 Corrie Mckusick, DO [facility]  . IR GENERIC HISTORICAL  05/07/2016   IR US GUIDE VASC ACCESS RIGHT 05/07/2016 Corrie Mckusick, DO [facility]  . IR GENERIC HISTORICAL  05/22/2016   IR US GUIDE VASC ACCESS RIGHT 05/22/2016 Greggory Keen, MD [facility]  . IR GENERIC HISTORICAL  05/22/2016   IR FLUORO GUIDE CV LINE RIGHT 05/22/2016 Greggory Keen, MD [facility]  .  PERIPHERAL VASCULAR CATHETERIZATION Left 05/09/2016   Procedure: A/V Fistulagram;  Surgeon: Angelia Mould, MD;  Location: [place] [facility];  Service: Cardiovascular;  Laterality: Left;  arm   Family History  Problem Relation Age of Onset  . Diabetes Mother   . Diabetes Father    Social History:  reports that he  has never smoked. He has never used smokeless tobacco. He reports that he does not drink alcohol or use drugs. No Known Allergies Prior to Admission medications   Medication Sig Start Date End Date Taking? Authorizing Provider  glipiZIDE (GLUCOTROL) 5 MG tablet Take 5 mg by mouth daily before breakfast.   Yes Historical Provider, MD  lamiVUDine (EPIVIR) 10 MG/ML solution Take 25 mg by mouth daily.   Yes Historical Provider, MD  multivitamin (RENA-VIT) TABS tablet Take 1 tablet by mouth at bedtime. 03/06/16  Yes Marijean Heath, NP  omeprazole (PRILOSEC) 40 MG capsule TAKE 1 CAPSULE BY MOUTH EVERY DAY 04/24/16  Yes [name], MD  sulfamethoxazole-trimethoprim (BACTRIM,SEPTRA) 400-80 MG tablet TAKE 1 TABLET BY MOUTH DAILY 08/06/16  Yes Truman Hayward, MD  tenofovir (VIREAD) 300 MG tablet TAKE 1 TABLET BY MOUTH ONCE WEEKLY ON SUNDAY 08/13/16  Yes Truman Hayward, MD  TIVICAY 50 MG tablet TAKE 1 TABLET BY MOUTH DAILY 08/13/16  Yes Truman Hayward, MD  midodrine (PROAMATINE) 10 MG tablet Take 1 tablet (10 mg total) by mouth 3 (three) times daily with meals. Patient not taking: Reported on 08/16/2016 04/24/16   [name], MD   Current Facility-Administered Medications  Medication Dose Route Frequency Provider Last Rate Last Dose  . acetaminophen (TYLENOL) tablet 650 mg  650 mg Oral Q6H PRN Ivor Costa, MD       Or  . acetaminophen (TYLENOL) suppository 650 mg  650 mg Rectal Q6H PRN Ivor Costa, MD      . dolutegravir (TIVICAY) tablet 50 mg  50 mg Oral Daily Ivor Costa, MD   50 mg at 08/16/16 0750  . insulin aspart (novoLOG) injection 0-5 Units  0-5 Units Subcutaneous QHS Ivor Costa, MD      . insulin aspart (novoLOG) injection 0-9 Units  0-9 Units Subcutaneous TID WC Ivor Costa, MD      . methocarbamol (ROBAXIN) tablet 500 mg  500 mg Oral Q8H PRN Ivor Costa, MD      . midodrine (PROAMATINE) tablet 10 mg  10 mg Oral TID WC Ivor Costa, MD   10 mg at 08/16/16 0750  . morphine 4 MG/ML injection  2 mg  2 mg Intravenous Q4H PRN Ivor Costa, MD      . multivitamin (RENA-VIT) tablet 1 tablet  1 tablet Oral QHS Ivor Costa, MD      . ondansetron [facility]) tablet 4 mg  4 mg Oral Q6H PRN Ivor Costa, MD       Or  . ondansetron [facility]) injection 4 mg  4 mg Intravenous Q6H PRN Ivor Costa, MD      . oxyCODONE-acetaminophen (PERCOCET/ROXICET) 5-325 MG per tablet 1 tablet  1 tablet Oral Q4H PRN Ivor Costa, MD      . pantoprazole (PROTONIX) EC tablet 40 mg  40 mg Oral Daily Ivor Costa, MD   40 mg at 08/16/16 0750  . sodium chloride flush (NS) 0.9 % injection 3 mL  3 mL Intravenous Q12H Ivor Costa, MD   3 mL at 08/16/16 0757  . sulfamethoxazole-trimethoprim (BACTRIM,SEPTRA) 400-80 MG per tablet 1 tablet  1 tablet  Oral Daily Ivor Costa, MD   Stopped at 08/16/16 1000  . zolpidem (AMBIEN) tablet 5 mg  5 mg Oral QHS PRN Ivor Costa, MD        ROS: As per HPI otherwise negative.  Physical Exam: Vitals:   08/16/16 0900 08/16/16 1000 08/16/16 1030 08/16/16 1100  BP: (!) 79/58 (!) 71/43 (!) 95/51 97/65  Pulse: 85 86 94 99  Resp: 14 13 16  (!) 22  Temp:      TempSrc:      SpO2: 96% 97% 97% 96%  Weight:      Height:         General: Thin Hispanic male NAD pleasant  Head: NCAT sclera not icteric MMM Neck: Supple. No JVD  Lungs: CTA bilaterally without wheezes, rales, or rhonchi. Breathing is unlabored. Heart: RRR with S1 S2 Abdomen: soft NT + BS Lower extremities: L BKA Left leg in ace wrap no edema R LE  Neuro: A & O  X 3. Moves all extremities spontaneously. Psych:  Responds to questions appropriately with a normal affect. Dialysis Access: RIJ TDC in place  /  Has started cannulation LAVF +bruit/thrill   Labs: Basic Metabolic Panel:  Recent Labs Lab 08/15/16 2230 08/16/16 0149  NA 134* 137  K 3.1* 3.3*  CL 94* 98*  CO2 28 26  GLUCOSE 195* 183*  BUN 26* 27*  CREATININE 5.70* 5.78*  CALCIUM 7.8* 7.6*   Liver Function Tests: No results for input(s): AST, ALT, ALKPHOS, BILITOT, PROT, ALBUMIN in  the last 168 hours. No results for input(s): LIPASE, AMYLASE in the last 168 hours. No results for input(s): AMMONIA in the last 168 hours. CBC:  Recent Labs Lab 08/15/16 2230 08/16/16 0149 08/16/16 0435  WBC 9.0 8.1 7.7  HGB 7.9* 7.5* 7.1*  HCT 23.9* 22.7* 21.9*  MCV 99.6 100.0 100.5*  PLT 169 134* 142*   Cardiac Enzymes:  Recent Labs Lab 08/15/16 2230  CKTOTAL 79   CBG:  Recent Labs Lab 08/16/16 0154  GLUCAP 168*   Iron Studies: No results for input(s): IRON, TIBC, TRANSFERRIN, FERRITIN in the last 72 hours. Studies/Results: Dg Chest 2 View  Result Date: 08/16/2016 CLINICAL DATA:  Patient fell and found to be hypotensive. EXAM: CHEST  2 VIEW COMPARISON:  CXR 05/08/2016 FINDINGS: The heart size and mediastinal contours are within normal limits. Right IJ dialysis catheter tip in the proximal right atrium. Both lungs are clear. The visualized skeletal structures are unremarkable. IMPRESSION: No active cardiopulmonary disease. Dialysis catheter tip in right atrium. No acute osseous appearing abnormality of the bony thorax. Electronically Signed   By: Ashley Royalty M.D.   On: 08/16/2016 00:00   Dg Femur Min 2 Views Left  Result Date: 08/15/2016 CLINICAL DATA:  Patient fell from a walker complaining of left knee pain EXAM: LEFT FEMUR 2 VIEWS COMPARISON:  None. FINDINGS: There is an acute, closed, comminuted impacted supracondylar fracture of the left femur with 1/4 shaft with dorsal displacement of the distal fracture fragments. There is chondrocalcinosis with femorotibial joint space narrowing. Below-knee amputation with intact surgical margins. Extensive calcific arteriosclerosis of the visualized femoral through tibial arteries. IMPRESSION: Acute, closed, comminuted and impacted supracondylar fracture of the left femur with 1/4 shaft width dorsal displacement of the distal fracture fragments. Status post BKA. Electronically Signed   By: Ashley Royalty M.D.   On: 08/15/2016 23:59     Dialysis Orders:  TThS at [facility] 4 h 160 F BFR 400/DFRA1.5 2K/2Ca  EDW 48kg  -  LAVF begun cannulating week of 4/3 Heparin 2000 unit bolus q HD - Mircera 75 mcg IV q weekly (47mcg dosed 3/24)  - Hectoral 54mcg IV q HD OP Labs: Hgb 11.7 Tsat 36% Corr Ca 8.5 P 4.0 PTH 538  Assessment/Plan: 1.  L femur fracture - per primary ortho consult pending  2.  ESRD -  TTS - Plan HD tomorrow on schedule 4K bath  3.  Hypotension/volume  - chronic hypotension on midodrine/ no gross volume on exam - minimal UF  4.  Anemia  - Hgb trending down to 7.0 with fracture FOBT ordered - will give max esa with HD tomorrow,  transfuse prn  5.  Metabolic bone disease -  Cont VDRA/ No binders currently - follow renal panel  6.  Nutrition - Renal diet/ vitamins/ protein supp 7. DM - per primary - says now off insulin 8. HIV - per primary on ART  Lynnda Child PA-C [place] Pager [phone] 08/16/2016, 11:04 AM   Pt seen, examined and agree w A/P as above. ESRD pt for many years, here with fall and L femur fracture. Ortho evaluating.  Stable vol and electrolytes.  He is asking about transplant someday, has a relative in [place] that wants to donate.  Will plan HD tomorrow.  Kelly Splinter MD [employer] pager [phone]   08/16/2016, 4:30 PM

## 2016-08-16 NOTE — Progress Notes (Signed)
Patient SBP went down to the 60's. Manual recheck was 70/49. Patient is asymptomatic without any change from baseline. 500 ml NS bolus given per Dr Edgar Frisk notes. Hospitalist notified.

## 2016-08-16 NOTE — Progress Notes (Signed)
Interpreter Lesle Chris for Surgery Team

## 2016-08-16 NOTE — H&P (Addendum)
History and Physical    Alvin Daniels JTT:[id number] DOB: 04/10/1971 DOA: 08/15/2016  Referring MD/NP/PA:   PCP: [name], MD   Patient coming from:  The patient is coming from home.  At baseline, pt is partially dependent for most of ADL.   Chief Complaint: left leg pain after fall  HPI: Alvin Daniels is a 46 y.o. male with medical history significant of diabetes mellitus, GERD, pancreatitis, HCV, HIV (350 on 07/24/2016), ESRD-HD (TTS), chronic diarrhea, s/p of L BKA, chronic hypotension (baseline SBP at ~80s), who presents with left leg pain after fall.  The patient states that 4 days ago he fell when was using his walker in his house and slipped.  He struck his "back" and the anterior aspect of the L knee. He is currently reporting constant pain at the lateral side of left distal thigh. He denies any head or neck trauma. No headache or neck pain. He is hypotensive in triag. He reports chronic hypotension with SBP at ~80 mmHg. He is alert and oriented x3. No confusion. Patient denies chest pain, cough, shortness rest. No nausea, vomiting, diarrhea or abdominal pain. He has chills, no fever. No symptoms of UTI or unilateral weakness. He stopped his diabetes regimen several months ago on physician instruction. He states that he attended dialysis today and had full treatment.  ED Course: pt was found to have hemoglobin dropped from 11.7-->7.9-->7.5, lactate normal, potassium 3.1, bicarbonate 28, creatinine 5.7, BUN 26, temperature 99.3, O2 saturation 100% on room air, negative chest x-ray. X-ray of left femur showed acute, closed, comminuted and impacted supracondylar fracture of the left femur with 1/4 shaft width dorsal displacement of the distal fracture fragments. Pt is admitted to SDU as inpt (due to hypotension, pt was refused from tele floor). Ortho, Dr. Heinz Knuckles was consulted.  Review of Systems:   General: no fevers, has chills, no changes in body weight, has  fatigue HEENT: no blurry vision, hearing changes or sore throat Respiratory: no dyspnea, coughing, wheezing CV: no chest pain, no palpitations GI: no nausea, vomiting, abdominal pain, diarrhea, constipation GU: no dysuria, burning on urination, increased urinary frequency, hematuria  Ext: has tenderness in lateral side of left distal thigh. s/p of L BKA. Neuro: no unilateral weakness, numbness, or tingling, no vision change or hearing loss Skin: no rash, no skin tear. MSK: No muscle spasm, no deformity, no limitation of range of movement in spin Heme: No easy bruising.  Travel history: No recent long distant travel.  Allergy: No Known Allergies  Past Medical History:  Diagnosis Date  . AIDS ([place]) 11/22/2014  . Chronic diarrhea   . Chronic hepatitis C without hepatic coma ([place]) 11/22/2014  . Diabetic neuropathy ([place])   . ESRD (end stage renal disease) on dialysis [facility])    "TTS; don't remember street name" (05/03/2014)  . Hepatitis C   . HIV INFECTION 06/27/2010   Qualifier: Diagnosis of  By: Nickola Major CMA ( [place]), Geni Bers    . Hypotension 06/02/2012  . Metabolic bone disease 0/0/9233  . MRSA infection   . Normocytic anemia 06/17/2012  . Pancreatitis   . Pressure ulcer of BKA stump, stage 2 ([place]) 11/22/2014  . Renal disorder   . Severe protein-calorie malnutrition ([place]) 06/17/2012  . Uncontrolled diabetes mellitus with complications ([place]) 0/11/6224   Annotation: uncontrolled Qualifier: Diagnosis of  By: Nickola Major CMA Deborra Medina), Geni Bers      Past Surgical History:  Procedure Laterality Date  . AMPUTATION Left 04/20/2014   Procedure: 3rd toe amputation,  4th Toe Amputation,  5th Toe Amputation;  Surgeon: Newt Minion, MD;  Location: [place];  Service: Orthopedics;  Laterality: Left;  . AMPUTATION Left 05/02/2014   Procedure: Midfoot Amputation;  Surgeon: Newt Minion, MD;  Location: [place];  Service: Orthopedics;  Laterality: Left;  . AMPUTATION Left 06/17/2014   Procedure: AMPUTATION  BELOW KNEE;  Surgeon: Newt Minion, MD;  Location: [place];  Service: Orthopedics;  Laterality: Left;  . AV FISTULA PLACEMENT Left   . AV FISTULA PLACEMENT Left 05/10/2016   Procedure: Creation Left Arm Brachiocephalic Arteriovenous Fistula and Ligation of Radiocephalic Fistula;  Surgeon: Angelia Mould, MD;  Location: [place];  Service: Vascular;  Laterality: Left;  . FOOT AMPUTATION THROUGH ANKLE Left 12/'21/2015   midfoot  . IR GENERIC HISTORICAL Left 05/01/2016   IR THROMBECTOMY AV FISTULA W/THROMBOLYSIS/PTA INC/SHUNT/IMG LEFT 05/01/2016 [name], MD [facility]  . IR GENERIC HISTORICAL  05/01/2016   IR US GUIDE VASC ACCESS LEFT 05/01/2016 [name], MD [facility]  . IR GENERIC HISTORICAL  05/07/2016   IR FLUORO GUIDE CV LINE RIGHT 05/07/2016 Corrie Mckusick, DO [facility]  . IR GENERIC HISTORICAL  05/07/2016   IR US GUIDE VASC ACCESS RIGHT 05/07/2016 Corrie Mckusick, DO [facility]  . IR GENERIC HISTORICAL  05/22/2016   IR US GUIDE VASC ACCESS RIGHT 05/22/2016 Greggory Keen, MD [facility]  . IR GENERIC HISTORICAL  05/22/2016   IR FLUORO GUIDE CV LINE RIGHT 05/22/2016 Greggory Keen, MD [facility]  . PERIPHERAL VASCULAR CATHETERIZATION Left 05/09/2016   Procedure: A/V Fistulagram;  Surgeon: Angelia Mould, MD;  Location: [place] [facility];  Service: Cardiovascular;  Laterality: Left;  arm    Social History:  reports that he has never smoked. He has never used smokeless tobacco. He reports that he does not drink alcohol or use drugs.  Family History:  Family History  Problem Relation Age of Onset  . Diabetes Mother   . Diabetes Father      Prior to Admission medications   Medication Sig Start Date End Date Taking? Authorizing Provider  midodrine (PROAMATINE) 10 MG tablet Take 1 tablet (10 mg total) by mouth 3 (three) times daily with meals. 04/24/16   [name], MD  multivitamin (RENA-VIT) TABS tablet Take 1 tablet by mouth at bedtime. 03/06/16    Marijean Heath, NP  omeprazole (PRILOSEC) 40 MG capsule TAKE 1 CAPSULE BY MOUTH EVERY DAY 04/24/16   [name], MD  sulfamethoxazole-trimethoprim (BACTRIM,SEPTRA) 400-80 MG tablet TAKE 1 TABLET BY MOUTH DAILY 08/06/16   Truman Hayward, MD  tenofovir (VIREAD) 300 MG tablet TAKE 1 TABLET BY MOUTH ONCE WEEKLY ON SUNDAY Patient not taking: Reported on 08/14/2016 08/13/16   Truman Hayward, MD  TIVICAY 50 MG tablet TAKE 1 TABLET BY MOUTH DAILY 08/13/16   Truman Hayward, MD    Physical Exam: Vitals:   08/16/16 0315 08/16/16 0330 08/16/16 0345 08/16/16 0420  BP: (!) 83/50 (!) 72/39 (!) 70/40   Pulse: 95 91 91   Resp: 11 13 15    Temp:    98.2 F (36.8 C)  TempSrc:    Oral  SpO2: 97% 100% 100%   Weight:      Height:       General: Not in acute distress HEENT:       Eyes: PERRL, EOMI, no scleral icterus.       ENT: No discharge from the ears and nose, no pharynx  injection, no tonsillar enlargement.        Neck: No JVD, no bruit, no mass felt. Heme: No neck lymph node enlargement. Cardiac: S1/S2, RRR, No murmurs, No gallops or rubs. Respiratory: No rales, wheezing, rhonchi or rubs. GI: Soft, nondistended, nontender, no rebound pain, no organomegaly, BS present. GU: No hematuria Ext: No pitting leg edema bilaterally. 2+DP/PT pulse in right leg. has tenderness in lateral side of left distal thigh. No redness or warmth.  s/p of L BKA.  Musculoskeletal: No joint redness or warmth Skin: No rashes.  Neuro: Alert, oriented X3, cranial nerves II-XII grossly intact, moves all extremities normally.   Psych: Patient is not psychotic, no suicidal or hemocidal ideation.  Labs on Admission: I have personally reviewed following labs and imaging studies  CBC:  Recent Labs Lab 08/15/16 2230 08/16/16 0149  WBC 9.0 8.1  HGB 7.9* 7.5*  HCT 23.9* 22.7*  MCV 99.6 100.0  PLT 169 263*   Basic Metabolic Panel:  Recent Labs Lab 08/15/16 2230 08/16/16 0149  NA 134* 137  K 3.1*  3.3*  CL 94* 98*  CO2 28 26  GLUCOSE 195* 183*  BUN 26* 27*  CREATININE 5.70* 5.78*  CALCIUM 7.8* 7.6*  MG  --  2.2   GFR: Estimated Creatinine Clearance: 13.5 mL/min (A) (by C-G formula based on SCr of 5.78 mg/dL (H)). Liver Function Tests: No results for input(s): AST, ALT, ALKPHOS, BILITOT, PROT, ALBUMIN in the last 168 hours. No results for input(s): LIPASE, AMYLASE in the last 168 hours. No results for input(s): AMMONIA in the last 168 hours. Coagulation Profile:  Recent Labs Lab 08/16/16 0149  INR 1.27   Cardiac Enzymes:  Recent Labs Lab 08/15/16 2230  CKTOTAL 79   BNP (last 3 results) No results for input(s): PROBNP in the last 8760 hours. HbA1C: No results for input(s): HGBA1C in the last 72 hours. CBG:  Recent Labs Lab 08/16/16 0154  GLUCAP 168*   Lipid Profile: No results for input(s): CHOL, HDL, LDLCALC, TRIG, CHOLHDL, LDLDIRECT in the last 72 hours. Thyroid Function Tests: No results for input(s): TSH, T4TOTAL, FREET4, T3FREE, THYROIDAB in the last 72 hours. Anemia Panel: No results for input(s): VITAMINB12, FOLATE, FERRITIN, TIBC, IRON, RETICCTPCT in the last 72 hours. Urine analysis:    Component Value Date/Time   COLORURINE YELLOW 07/04/2010 1840   APPEARANCEUR TURBID (A) 07/04/2010 1840   LABSPEC 1.013 07/04/2010 1840   PHURINE 7.0 07/04/2010 1840   GLUCOSEU NEG mg/dL 07/04/2010 1840   HGBUR TRACE (A) 03/13/2010 1824   BILIRUBINUR NEG 07/04/2010 1840   KETONESUR NEG mg/dL 07/04/2010 1840   PROTEINUR > 300 mg/dL (A) 07/04/2010 1840   UROBILINOGEN 0.2 07/04/2010 1840   NITRITE NEG 07/04/2010 1840   LEUKOCYTESUR LARGE (A) 07/04/2010 1840   Sepsis Labs: @LABRCNTIP (procalcitonin:4,lacticidven:4) )No results found for this or any previous visit (from the past 240 hour(s)).   Radiological Exams on Admission: Dg Chest 2 View  Result Date: 08/16/2016 CLINICAL DATA:  Patient fell and found to be hypotensive. EXAM: CHEST  2 VIEW COMPARISON:   CXR 05/08/2016 FINDINGS: The heart size and mediastinal contours are within normal limits. Right IJ dialysis catheter tip in the proximal right atrium. Both lungs are clear. The visualized skeletal structures are unremarkable. IMPRESSION: No active cardiopulmonary disease. Dialysis catheter tip in right atrium. No acute osseous appearing abnormality of the bony thorax. Electronically Signed   By: Ashley Royalty M.D.   On: 08/16/2016 00:00   Dg Femur Min 2 Views  Left  Result Date: 08/15/2016 CLINICAL DATA:  Patient fell from a walker complaining of left knee pain EXAM: LEFT FEMUR 2 VIEWS COMPARISON:  None. FINDINGS: There is an acute, closed, comminuted impacted supracondylar fracture of the left femur with 1/4 shaft with dorsal displacement of the distal fracture fragments. There is chondrocalcinosis with femorotibial joint space narrowing. Below-knee amputation with intact surgical margins. Extensive calcific arteriosclerosis of the visualized femoral through tibial arteries. IMPRESSION: Acute, closed, comminuted and impacted supracondylar fracture of the left femur with 1/4 shaft width dorsal displacement of the distal fracture fragments. Status post BKA. Electronically Signed   By: Ashley Royalty M.D.   On: 08/15/2016 23:59     EKG: Independently reviewed.  Sinus rhythm, QTC 435, no ischemic change.    Assessment/Plan Principal Problem:   Femur fracture, left ([name]) Active Problems:   Human immunodeficiency virus (HIV) disease ([name])   Diabetes mellitus with ESRD (end-stage renal disease) ([place])   Anemia of chronic renal failure   ESRD on dialysis ([name])   GERD (gastroesophageal reflux disease)   Hypokalemia   HIV (human immunodeficiency virus infection) ([place])   Femur fracture, left ([place]): As evidenced by x-ray. Patient has moderate pain now. No neurovascular compromise. Orthopedic surgeon was consulted. Dr. Heinz Knuckles will see pt in AM.   - will admit to SDU as inpt - Pain control: morphine prn and  percocet - Robaxin for muscle spasm - type and cross - INR/PTT  Hypotension: pt's blood pressure has been running low, baseline SBP at ~80s. His BP was 65/37, which improved to SBP 80s after 500 cc of NS bolus. EDP thinks that pt has possible infection in the left leg and given one dose of Vancomycin, but he does not have leukocytosis or fever. On my examination, patient does not have warmth or redness in left leg, does not seem to be infected. Patient does not have altered mental status. -will hold Abx now -Midodrine 10 mg tid -f/u blood culture -give prn NS bolus for SBP<75 -IVF: 75 cc/h of NS  Anemia of chronic renal failure: Patient's hemoglobin dropped from 11.7-->7.9-->7.5. Etiology is not clear. Likely due to bleeding from left femur fracture. Patient denies rectal bleeding, hematuria, hematemesis. -Check CBC every 6 hours--> transfuse blood if hemoglobin is less than 7 -INR/PTT/type screen -check FOBT  DM-II: Last A1c 5.0 on 05/24/16, well controled. Patient is off insulin now -Hold insulin  Human immunodeficiency virus (HIV) disease: CD4 350 and VL<20 on 07/24/16 -continue home HIV meds  Hypokalemia: K=3.1 on admission. - Repleted -check Mg level  GERD: -Protonix  ESRD on dialysis (TTS): pt has been compliant to dialysis. Last dialysis was on Thursday. potassium is 3.1, bicarbonate 28, creatinine 5.70. -left message to renal boxer for dialysis tomorrow  DVT ppx: SCD to right leg Code Status: Full code Family Communication:  Yes, patient's friend at bed side Disposition Plan:  Anticipate discharge back to previous home environment Consults called:  Ortho, dr. Lorin Mercy Admission status: tele/inpt  Date of Service 08/16/2016    Ivor Costa [facility] Pager [phone]  If 7PM-7AM, please contact night-coverage [email or web address] Password [facility] 08/16/2016, 4:28 AM

## 2016-08-16 NOTE — ED Notes (Signed)
Ortho tech paged for knee immobilizer

## 2016-08-16 NOTE — ED Notes (Signed)
Ortho tech in room 

## 2016-08-16 NOTE — Progress Notes (Addendum)
PROGRESS NOTE    Alvin Daniels  ZJI:[id number] DOB: [birth year] DOA: 08/15/2016 PCP: [name], MD   Brief Narrative: 46 y.o. male with medical history significant of diabetes mellitus, GERD, pancreatitis, HCV, HIV (350 on 07/24/2016), ESRD-HD (TTS), chronic diarrhea, s/p of L BKA, chronic hypotension (baseline SBP at ~80s), whopresents with left leg pain after fall.  In the ER patient was found to have hypertension, low hemoglobin and x-ray of left femur showed acute closed comminuted and impacted supracondylar fracture of the left femur. Orthopedic was consulted and patient was admitted for further evaluation.  Assessment & Plan:  # Left  Supracondylar femur fracture after the fall: -Orthopedic consult appreciated. Plan for surgical intervention today. Currently on nothing by mouth.  #Chronic hypotension and in ESRD patient: Low blood pressure likely contributed by vasculopathy. Patient is clinically asymptomatic. Reported that his baseline blood pressure is systolic 17P. Continue midodrine. Continue to monitor blood pressure. I discontinued IV fluid because of ESRD.  #ESRD on hemodialysis: Patient is Tuesday Thursday Saturday schedule. Nephrology was consulted. Continue to monitor.  #Anemia of chronic kidney disease: No sign of active bleeding. Monitor CBC. IV iron and ESA during dialysis defer to nephrologist. If patient requiring red blood cell transfusion will defer to surgery  Team and anesthesia.  #Hypokalemia in ESRD patient: Monitor electrolytes. Probably require higher potassium bath during dialysis.  #HIV disease: Continue antiviral medication. Recommended to follow-up with ID outpatient.  #Chronic thrombocytopenia: Monitor platelet counts.  Principal Problem:   Femur fracture, left ([name]) Active Problems:   Human immunodeficiency virus (HIV) disease ([place])   Diabetes mellitus with ESRD (end-stage renal disease) ([place])   Anemia of chronic renal failure   ESRD on  dialysis ([name])   GERD (gastroesophageal reflux disease)   Hypokalemia   HIV (human immunodeficiency virus infection) ([place])  DVT prophylaxis: SCD. Patient also with thrombocytopenia and going to surgery today. Code Status: Full code Family Communication: No family present at bedside Disposition Plan: Current inpatient    Consultants:   Orthopedics  Procedures: None Antimicrobials: None  Subjective: Patient was seen and examined at bedside. Patient reported feeling good. Denied headache, dizziness, lightheadedness, chest, shortness of breath, nausea or vomiting. Left leg pain stable.  Objective: Vitals:   08/16/16 1030 08/16/16 1100 08/16/16 1200 08/16/16 1246  BP: (!) 95/51 97/65 (!) 83/49 (!) 99/56  Pulse: 94 99 91 91  Resp: 16 (!) 22 14 16   Temp:   98.5 F (36.9 C) 98.7 F (37.1 C)  TempSrc:   Oral Oral  SpO2: 97% 96% 96% 99%  Weight:      Height:        Intake/Output Summary (Last 24 hours) at 08/16/16 1348 Last data filed at 08/16/16 1200  Gross per 24 hour  Intake           1082.5 ml  Output                0 ml  Net           1082.5 ml   Filed Weights   08/15/16 2223  Weight: 62.6 kg (138 lb)    Examination:  General exam: Appears calm and comfortable  Respiratory system: Clear to auscultation. Respiratory effort normal. No wheezing or crackle Cardiovascular system: S1 & S2 heard, RRR.  No pedal edema. Gastrointestinal system: Abdomen is nondistended, soft and nontender. Normal bowel sounds heard. Central nervous system: Alert and oriented. No focal neurological deficits. Extremities: Left leg has a cast applied no sign of  active bleeding.  Skin: No rashes, lesions or ulcers Psychiatry: Judgement and insight appear normal. Mood & affect appropriate.     Data Reviewed: I have personally reviewed following labs and imaging studies  CBC:  Recent Labs Lab 08/15/16 2230 08/16/16 0149 08/16/16 0435 08/16/16 1048  WBC 9.0 8.1 7.7 7.1  HGB 7.9*  7.5* 7.1* 7.0*  HCT 23.9* 22.7* 21.9* 21.9*  MCV 99.6 100.0 100.5* 101.4*  PLT 169 134* 142* 478*   Basic Metabolic Panel:  Recent Labs Lab 08/15/16 2230 08/16/16 0149  NA 134* 137  K 3.1* 3.3*  CL 94* 98*  CO2 28 26  GLUCOSE 195* 183*  BUN 26* 27*  CREATININE 5.70* 5.78*  CALCIUM 7.8* 7.6*  MG  --  2.2   GFR: Estimated Creatinine Clearance: 13.5 mL/min (A) (by C-G formula based on SCr of 5.78 mg/dL (H)). Liver Function Tests: No results for input(s): AST, ALT, ALKPHOS, BILITOT, PROT, ALBUMIN in the last 168 hours. No results for input(s): LIPASE, AMYLASE in the last 168 hours. No results for input(s): AMMONIA in the last 168 hours. Coagulation Profile:  Recent Labs Lab 08/16/16 0149  INR 1.27   Cardiac Enzymes:  Recent Labs Lab 08/15/16 2230  CKTOTAL 79   BNP (last 3 results) No results for input(s): PROBNP in the last 8760 hours. HbA1C: No results for input(s): HGBA1C in the last 72 hours. CBG:  Recent Labs Lab 08/16/16 0154 08/16/16 1131  GLUCAP 168* 143*   Lipid Profile: No results for input(s): CHOL, HDL, LDLCALC, TRIG, CHOLHDL, LDLDIRECT in the last 72 hours. Thyroid Function Tests: No results for input(s): TSH, T4TOTAL, FREET4, T3FREE, THYROIDAB in the last 72 hours. Anemia Panel: No results for input(s): VITAMINB12, FOLATE, FERRITIN, TIBC, IRON, RETICCTPCT in the last 72 hours. Sepsis Labs:  Recent Labs Lab 08/15/16 2244  LATICACIDVEN 1.72    Recent Results (from the past 240 hour(s))  MRSA PCR Screening     Status: None   Collection Time: 08/16/16  4:24 AM  Result Value Ref Range Status   MRSA by PCR NEGATIVE NEGATIVE Final    Comment:        The GeneXpert MRSA Assay (FDA approved for NASAL specimens only), is one component of a comprehensive MRSA colonization surveillance program. It is not intended to diagnose MRSA infection nor to guide or monitor treatment for MRSA infections.          Radiology Studies: Dg Chest 2  View  Result Date: 08/16/2016 CLINICAL DATA:  Patient fell and found to be hypotensive. EXAM: CHEST  2 VIEW COMPARISON:  CXR 05/08/2016 FINDINGS: The heart size and mediastinal contours are within normal limits. Right IJ dialysis catheter tip in the proximal right atrium. Both lungs are clear. The visualized skeletal structures are unremarkable. IMPRESSION: No active cardiopulmonary disease. Dialysis catheter tip in right atrium. No acute osseous appearing abnormality of the bony thorax. Electronically Signed   By: Ashley Royalty M.D.   On: 08/16/2016 00:00   Dg Femur Min 2 Views Left  Result Date: 08/15/2016 CLINICAL DATA:  Patient fell from a walker complaining of left knee pain EXAM: LEFT FEMUR 2 VIEWS COMPARISON:  None. FINDINGS: There is an acute, closed, comminuted impacted supracondylar fracture of the left femur with 1/4 shaft with dorsal displacement of the distal fracture fragments. There is chondrocalcinosis with femorotibial joint space narrowing. Below-knee amputation with intact surgical margins. Extensive calcific arteriosclerosis of the visualized femoral through tibial arteries. IMPRESSION: Acute, closed, comminuted and impacted supracondylar fracture  of the left femur with 1/4 shaft width dorsal displacement of the distal fracture fragments. Status post BKA. Electronically Signed   By: Ashley Royalty M.D.   On: 08/15/2016 23:59        Scheduled Meds: . [START ON 08/17/2016] darbepoetin (ARANESP) injection - DIALYSIS  200 mcg Intravenous Q Sat-HD  . dolutegravir  50 mg Oral Daily  . [START ON 08/17/2016] doxercalciferol  2 mcg Intravenous Q T,Th,Sa-HD  . [START ON 08/17/2016] feeding supplement (NEPRO CARB STEADY)  237 mL Oral BID BM  . insulin aspart  0-5 Units Subcutaneous QHS  . insulin aspart  0-9 Units Subcutaneous TID WC  . lamiVUDine  25 mg Oral Daily  . midodrine  10 mg Oral TID WC  . multivitamin  1 tablet Oral QHS  . pantoprazole  40 mg Oral Daily  . sodium chloride flush  3 mL  Intravenous Q12H  . sulfamethoxazole-trimethoprim  1 tablet Oral Daily  . [START ON 08/18/2016] tenofovir  300 mg Oral Q Sun   Continuous Infusions:   LOS: 0 days    Ramez Arrona [name], MD [facility] Pager [phone]  If 7PM-7AM, please contact night-coverage [email or web address] Password [email or web address] 08/16/2016, 1:48 PM

## 2016-08-16 NOTE — Consult Note (Signed)
Reason for Consult:Left supracondylar femur fx Referring Physician: S Rancour  Alvin Daniels is an 46 y.o. male.  HPI: [name] was at home 4d ago when he fell while ambulating with his walker. He chose not to seek care until now as it was still hurting and swollen. Work-up revealed a supracondylar femur fracture and orthopedic surgery was consulted. He was admitted to IM given his multiple medical problems. Visit was conducted with aid of interpreter though patient speaks passable [place].  Past Medical History:  Diagnosis Date  . AIDS ([place]) 11/22/2014  . Chronic diarrhea   . Chronic hepatitis C without hepatic coma ([place]) 11/22/2014  . Diabetic neuropathy ([place])   . ESRD (end stage renal disease) on dialysis [facility])    "TTS; don't remember street name" (05/03/2014)  . Hepatitis C   . HIV INFECTION 06/27/2010   Qualifier: Diagnosis of  By: Nickola Major CMA ( [place]), Geni Bers    . Hypotension 06/02/2012  . Metabolic bone disease 10/18/1273  . MRSA infection   . Normocytic anemia 06/17/2012  . Pancreatitis   . Pressure ulcer of BKA stump, stage 2 ([place]) 11/22/2014  . Renal disorder   . Severe protein-calorie malnutrition ([place]) 06/17/2012  . Uncontrolled diabetes mellitus with complications ([place]) 1/70/0174   Annotation: uncontrolled Qualifier: Diagnosis of  By: Nickola Major CMA Deborra Medina), Geni Bers      Past Surgical History:  Procedure Laterality Date  . AMPUTATION Left 04/20/2014   Procedure: 3rd toe amputation, 4th Toe Amputation,  5th Toe Amputation;  Surgeon: Newt Minion, MD;  Location: [place];  Service: Orthopedics;  Laterality: Left;  . AMPUTATION Left 05/02/2014   Procedure: Midfoot Amputation;  Surgeon: Newt Minion, MD;  Location: [place];  Service: Orthopedics;  Laterality: Left;  . AMPUTATION Left 06/17/2014   Procedure: AMPUTATION BELOW KNEE;  Surgeon: Newt Minion, MD;  Location: [place];  Service: Orthopedics;  Laterality: Left;  . AV FISTULA PLACEMENT Left   . AV FISTULA  PLACEMENT Left 05/10/2016   Procedure: Creation Left Arm Brachiocephalic Arteriovenous Fistula and Ligation of Radiocephalic Fistula;  Surgeon: Angelia Mould, MD;  Location: [place];  Service: Vascular;  Laterality: Left;  . FOOT AMPUTATION THROUGH ANKLE Left 12/'21/2015   midfoot  . IR GENERIC HISTORICAL Left 05/01/2016   IR THROMBECTOMY AV FISTULA W/THROMBOLYSIS/PTA INC/SHUNT/IMG LEFT 05/01/2016 [name], MD [facility]  . IR GENERIC HISTORICAL  05/01/2016   IR US GUIDE VASC ACCESS LEFT 05/01/2016 [name], MD [facility]  . IR GENERIC HISTORICAL  05/07/2016   IR FLUORO GUIDE CV LINE RIGHT 05/07/2016 Corrie Mckusick, DO [facility]  . IR GENERIC HISTORICAL  05/07/2016   IR US GUIDE VASC ACCESS RIGHT 05/07/2016 Corrie Mckusick, DO [facility]  . IR GENERIC HISTORICAL  05/22/2016   IR US GUIDE VASC ACCESS RIGHT 05/22/2016 Greggory Keen, MD [facility]  . IR GENERIC HISTORICAL  05/22/2016   IR FLUORO GUIDE CV LINE RIGHT 05/22/2016 Greggory Keen, MD [facility]  . PERIPHERAL VASCULAR CATHETERIZATION Left 05/09/2016   Procedure: A/V Fistulagram;  Surgeon: Angelia Mould, MD;  Location: [facility];  Service: Cardiovascular;  Laterality: Left;  arm    Family History  Problem Relation Age of Onset  . Diabetes Mother   . Diabetes Father     Social History:  reports that he has never smoked. He has never used smokeless tobacco. He reports that he does not drink alcohol or use drugs.  Allergies: No Known Allergies  Medications: I  have reviewed the patient's current medications.  Results for orders placed or performed during the hospital encounter of 08/15/16 (from the past 48 hour(s))  Basic metabolic panel     Status: Abnormal   Collection Time: 08/15/16 10:30 PM  Result Value Ref Range   Sodium 134 (L) 135 - 145 mmol/L   Potassium 3.1 (L) 3.5 - 5.1 mmol/L   Chloride 94 (L) 101 - 111 mmol/L   CO2 28 22 - 32 mmol/L   Glucose, Bld 195 (H) 65 - 99 mg/dL    BUN 26 (H) 6 - 20 mg/dL   Creatinine, Ser 5.70 (H) 0.61 - 1.24 mg/dL   Calcium 7.8 (L) 8.9 - 10.3 mg/dL   GFR calc non Af Amer 11 (L) >60 mL/min   GFR calc Af Amer 13 (L) >60 mL/min    Comment: (NOTE) The eGFR has been calculated using the CKD EPI equation. This calculation has not been validated in all clinical situations. eGFR's persistently <60 mL/min signify possible Chronic Kidney Disease.    Anion gap 12 5 - 15  CBC     Status: Abnormal   Collection Time: 08/15/16 10:30 PM  Result Value Ref Range   WBC 9.0 4.0 - 10.5 K/uL   RBC 2.40 (L) 4.22 - 5.81 MIL/uL   Hemoglobin 7.9 (L) 13.0 - 17.0 g/dL   HCT 23.9 (L) 39.0 - 52.0 %   MCV 99.6 78.0 - 100.0 fL   MCH 32.9 26.0 - 34.0 pg   MCHC 33.1 30.0 - 36.0 g/dL   RDW 14.2 11.5 - 15.5 %   Platelets 169 150 - 400 K/uL  CK     Status: None   Collection Time: 08/15/16 10:30 PM  Result Value Ref Range   Total CK 79 49 - 397 U/L  Sedimentation rate     Status: Abnormal   Collection Time: 08/15/16 10:30 PM  Result Value Ref Range   Sed Rate 132 (H) 0 - 16 mm/hr  C-reactive protein     Status: Abnormal   Collection Time: 08/15/16 10:30 PM  Result Value Ref Range   CRP 28.3 (H) <1.0 mg/dL  I-Stat CG4 Lactic Acid, ED     Status: None   Collection Time: 08/15/16 10:44 PM  Result Value Ref Range   Lactic Acid, Venous 1.72 0.5 - 1.9 mmol/L  Magnesium     Status: None   Collection Time: 08/16/16  1:49 AM  Result Value Ref Range   Magnesium 2.2 1.7 - 2.4 mg/dL  Basic metabolic panel     Status: Abnormal   Collection Time: 08/16/16  1:49 AM  Result Value Ref Range   Sodium 137 135 - 145 mmol/L   Potassium 3.3 (L) 3.5 - 5.1 mmol/L   Chloride 98 (L) 101 - 111 mmol/L   CO2 26 22 - 32 mmol/L   Glucose, Bld 183 (H) 65 - 99 mg/dL   BUN 27 (H) 6 - 20 mg/dL   Creatinine, Ser 5.78 (H) 0.61 - 1.24 mg/dL   Calcium 7.6 (L) 8.9 - 10.3 mg/dL   GFR calc non Af Amer 11 (L) >60 mL/min   GFR calc Af Amer 12 (L) >60 mL/min    Comment:  (NOTE) The eGFR has been calculated using the CKD EPI equation. This calculation has not been validated in all clinical situations. eGFR's persistently <60 mL/min signify possible Chronic Kidney Disease.    Anion gap 13 5 - 15  CBC     Status: Abnormal  Collection Time: 08/16/16  1:49 AM  Result Value Ref Range   WBC 8.1 4.0 - 10.5 K/uL   RBC 2.27 (L) 4.22 - 5.81 MIL/uL   Hemoglobin 7.5 (L) 13.0 - 17.0 g/dL   HCT 22.7 (L) 39.0 - 52.0 %   MCV 100.0 78.0 - 100.0 fL   MCH 33.0 26.0 - 34.0 pg   MCHC 33.0 30.0 - 36.0 g/dL   RDW 14.1 11.5 - 15.5 %   Platelets 134 (L) 150 - 400 K/uL  Protime-INR     Status: Abnormal   Collection Time: 08/16/16  1:49 AM  Result Value Ref Range   Prothrombin Time 16.0 (H) 11.4 - 15.2 seconds   INR 1.27   APTT     Status: Abnormal   Collection Time: 08/16/16  1:49 AM  Result Value Ref Range   aPTT 45 (H) 24 - 36 seconds    Comment:        IF BASELINE aPTT IS ELEVATED, SUGGEST PATIENT RISK ASSESSMENT BE USED TO DETERMINE APPROPRIATE ANTICOAGULANT THERAPY.   CBG monitoring, ED     Status: Abnormal   Collection Time: 08/16/16  1:54 AM  Result Value Ref Range   Glucose-Capillary 168 (H) 65 - 99 mg/dL  Type and screen [place]     Status: None   Collection Time: 08/16/16  1:56 AM  Result Value Ref Range   ABO/RH(D) O POS    Antibody Screen NEG    Sample Expiration 08/19/2016   MRSA PCR Screening     Status: None   Collection Time: 08/16/16  4:24 AM  Result Value Ref Range   MRSA by PCR NEGATIVE NEGATIVE    Comment:        The GeneXpert MRSA Assay (FDA approved for NASAL specimens only), is one component of a comprehensive MRSA colonization surveillance program. It is not intended to diagnose MRSA infection nor to guide or monitor treatment for MRSA infections.   CBC     Status: Abnormal   Collection Time: 08/16/16  4:35 AM  Result Value Ref Range   WBC 7.7 4.0 - 10.5 K/uL   RBC 2.18 (L) 4.22 - 5.81 MIL/uL    Hemoglobin 7.1 (L) 13.0 - 17.0 g/dL   HCT 21.9 (L) 39.0 - 52.0 %   MCV 100.5 (H) 78.0 - 100.0 fL   MCH 32.6 26.0 - 34.0 pg   MCHC 32.4 30.0 - 36.0 g/dL   RDW 14.3 11.5 - 15.5 %   Platelets 142 (L) 150 - 400 K/uL  CBC     Status: Abnormal   Collection Time: 08/16/16 10:48 AM  Result Value Ref Range   WBC 7.1 4.0 - 10.5 K/uL   RBC 2.16 (L) 4.22 - 5.81 MIL/uL   Hemoglobin 7.0 (L) 13.0 - 17.0 g/dL   HCT 21.9 (L) 39.0 - 52.0 %   MCV 101.4 (H) 78.0 - 100.0 fL   MCH 32.4 26.0 - 34.0 pg   MCHC 32.0 30.0 - 36.0 g/dL   RDW 14.2 11.5 - 15.5 %   Platelets 135 (L) 150 - 400 K/uL    Dg Chest 2 View  Result Date: 08/16/2016 CLINICAL DATA:  Patient fell and found to be hypotensive. EXAM: CHEST  2 VIEW COMPARISON:  CXR 05/08/2016 FINDINGS: The heart size and mediastinal contours are within normal limits. Right IJ dialysis catheter tip in the proximal right atrium. Both lungs are clear. The visualized skeletal structures are unremarkable. IMPRESSION: No active cardiopulmonary disease. Dialysis  catheter tip in right atrium. No acute osseous appearing abnormality of the bony thorax. Electronically Signed   By: Ashley Royalty M.D.   On: 08/16/2016 00:00   Dg Femur Min 2 Views Left  Result Date: 08/15/2016 CLINICAL DATA:  Patient fell from a walker complaining of left knee pain EXAM: LEFT FEMUR 2 VIEWS COMPARISON:  None. FINDINGS: There is an acute, closed, comminuted impacted supracondylar fracture of the left femur with 1/4 shaft with dorsal displacement of the distal fracture fragments. There is chondrocalcinosis with femorotibial joint space narrowing. Below-knee amputation with intact surgical margins. Extensive calcific arteriosclerosis of the visualized femoral through tibial arteries. IMPRESSION: Acute, closed, comminuted and impacted supracondylar fracture of the left femur with 1/4 shaft width dorsal displacement of the distal fracture fragments. Status post BKA. Electronically Signed   By: Ashley Royalty  M.D.   On: 08/15/2016 23:59    Review of Systems  Constitutional: Negative for weight loss.  HENT: Negative for ear discharge, ear pain, hearing loss and tinnitus.   Eyes: Negative for blurred vision, double vision, photophobia and pain.  Respiratory: Negative for cough, sputum production and shortness of breath.   Cardiovascular: Negative for chest pain.  Gastrointestinal: Negative for abdominal pain, nausea and vomiting.  Genitourinary: Negative for dysuria, flank pain, frequency and urgency.  Musculoskeletal: Positive for joint pain (Left thigh/knee). Negative for back pain, falls, myalgias and neck pain.  Neurological: Negative for dizziness, tingling, sensory change, focal weakness, loss of consciousness and headaches.  Endo/Heme/Allergies: Does not bruise/bleed easily.  Psychiatric/Behavioral: Negative for depression, memory loss and substance abuse. The patient is not nervous/anxious.    Blood pressure 97/65, pulse 99, temperature 98.9 F (37.2 C), temperature source Oral, resp. rate (!) 22, height _0  (1.626 m), weight 62.6 kg (138 lb), SpO2 96 %. Physical Exam  Constitutional: He appears well-developed and well-nourished. No distress.  HENT:  Head: Normocephalic and atraumatic.  Eyes: Conjunctivae are normal. Right eye exhibits no discharge. Left eye exhibits no discharge. No scleral icterus.  Neck: Normal range of motion. Neck supple.  Cardiovascular: Normal rate, regular rhythm and normal heart sounds.  Exam reveals no gallop and no friction rub.   No murmur heard. Respiratory: Effort normal and breath sounds normal. No respiratory distress. He has no wheezes. He has no rales.  GI: Soft.  Musculoskeletal:  UEx shoulder, elbow, wrist, digits- no skin wounds, nontender, no instability, no blocks to motion  Sens  Ax/R/M/U intact  Mot   Ax/ R/ PIN/ M/ AIN/ U intact  Rad 2+  RLE No traumatic wounds, ecchymosis, or rash  Nontender  No effusions  Knee stable to varus/  valgus and anterior/posterior stress  Sens DPN, SPN, TN intact  Motor EHL, ext, flex, evers 5/5  DP 1+, PT 0, No significant edema              Pigment changes in toes, ulcer on heel   LLE No traumatic wounds, ecchymosis, or rash  TTP distal thigh             s/p BKA     Lymphadenopathy:    He has no cervical adenopathy.  Skin: He is not diaphoretic.    Assessment/Plan: Fall Left supracondylar femur fx -- For retrograde IMN by Dr. Lorin Mercy today. Continue NPO. He is currently NWB on this extremity as he has no prosthesis. There is someone in [place] who is helping him try to get one at no cost. He is very worried about losing more  of the leg; I reassured him we would not amputate any more with this surgery. Multiple medical problems -- per primary service    Lisette Abu, PA-C Orthopedic Surgery [phone] 08/16/2016, 11:26 AM

## 2016-08-16 NOTE — ED Notes (Signed)
Spoke to Dr.Niu regarding pt's hypotension and tele bed assignment. Per Dr.Niu, pt is to stable for tele since hypotension is a chronic issue for patient. Midodrine to be ordered

## 2016-08-16 NOTE — Care Management Note (Signed)
Case Management Note  Patient Details  Name: Alvin Daniels MRN: [id number] Date of Birth: April 11, 1971  Subjective/Objective:     Pt admitted with femur fracture post fall               Action/Plan:   Pt is from Home, uses walker for ambulation assistance.  Pt is active with [facility].  CM will continue to follow for discharge neneds   Expected Discharge Date:                  Expected Discharge Plan:  [place]  In-House Referral:     Discharge planning Services  CM Consult  Post Acute Care Choice:    Choice offered to:     DME Arranged:    DME Agency:     HH Arranged:    HH Agency:     Status of Service:     If discussed at [employer], dates discussed:    Additional Comments:  Maryclare Labrador, RN 08/16/2016, 9:47 AM

## 2016-08-17 ENCOUNTER — Encounter (HOSPITAL_COMMUNITY)
Admission: EM | Disposition: A | Discharge status: Home / Self Care | Payer: Self-pay | Source: Home / Self Care | Attending: Internal Medicine

## 2016-08-17 ENCOUNTER — Inpatient Hospital Stay (HOSPITAL_COMMUNITY): Payer: Medicaid Other | Admitting: Anesthesiology

## 2016-08-17 ENCOUNTER — Encounter (HOSPITAL_COMMUNITY): Payer: Self-pay | Admitting: Certified Registered Nurse Anesthetist

## 2016-08-17 ENCOUNTER — Inpatient Hospital Stay (HOSPITAL_COMMUNITY): Payer: Medicaid Other

## 2016-08-17 HISTORY — PX: FEMUR IM NAIL: SHX1597

## 2016-08-17 LAB — PREPARE RBC (CROSSMATCH)

## 2016-08-17 LAB — BASIC METABOLIC PANEL
Anion gap: 13 (ref 5–15)
BUN: 41 mg/dL — AB (ref 6–20)
CALCIUM: 7.9 mg/dL — AB (ref 8.9–10.3)
CHLORIDE: 102 mmol/L (ref 101–111)
CO2: 21 mmol/L — AB (ref 22–32)
CREATININE: 8.27 mg/dL — AB (ref 0.61–1.24)
GFR calc non Af Amer: 7 mL/min — ABNORMAL LOW (ref >60)
GFR, EST AFRICAN AMERICAN: 8 mL/min — AB (ref >60)
Glucose, Bld: 188 mg/dL — ABNORMAL HIGH (ref 65–99)
Potassium: 4.2 mmol/L (ref 3.5–5.1)
Sodium: 136 mmol/L (ref 135–145)

## 2016-08-17 LAB — CBC
HEMATOCRIT: 23.7 % — AB (ref 39.0–52.0)
Hemoglobin: 7.7 g/dL — ABNORMAL LOW (ref 13.0–17.0)
MCH: 32.8 pg (ref 26.0–34.0)
MCHC: 32.5 g/dL (ref 30.0–36.0)
MCV: 100.9 fL — ABNORMAL HIGH (ref 78.0–100.0)
Platelets: 198 10*3/uL (ref 150–400)
RBC: 2.35 MIL/uL — ABNORMAL LOW (ref 4.22–5.81)
RDW: 14.4 % (ref 11.5–15.5)
WBC: 10.2 10*3/uL (ref 4.0–10.5)

## 2016-08-17 LAB — GLUCOSE, CAPILLARY
GLUCOSE-CAPILLARY: 128 mg/dL — AB (ref 65–99)
GLUCOSE-CAPILLARY: 147 mg/dL — AB (ref 65–99)

## 2016-08-17 LAB — POCT I-STAT 4, (NA,K, GLUC, HGB,HCT)
GLUCOSE: 147 mg/dL — AB (ref 65–99)
HEMATOCRIT: 29 % — AB (ref 39.0–52.0)
Hemoglobin: 9.9 g/dL — ABNORMAL LOW (ref 13.0–17.0)
POTASSIUM: 3.1 mmol/L — AB (ref 3.5–5.1)
SODIUM: 137 mmol/L (ref 135–145)

## 2016-08-17 SURGERY — INSERTION, INTRAMEDULLARY ROD, FEMUR, RETROGRADE
Anesthesia: General | Site: Leg Upper | Laterality: Left

## 2016-08-17 MED ORDER — BUPIVACAINE HCL (PF) 0.25 % IJ SOLN
INTRAMUSCULAR | Status: AC
  Filled 2016-08-17: qty 30

## 2016-08-17 MED ORDER — LIDOCAINE HCL (CARDIAC) 20 MG/ML IV SOLN
INTRAVENOUS | Status: DC | PRN
  Administered 2016-08-17: 20 mg via INTRAVENOUS

## 2016-08-17 MED ORDER — HYDROCODONE-ACETAMINOPHEN 5-325 MG PO TABS: 1.0000 | ORAL_TABLET | Freq: Four times a day (QID) | ORAL | Status: DC | PRN

## 2016-08-17 MED ORDER — MIDODRINE HCL 5 MG PO TABS
ORAL_TABLET | ORAL | Status: AC
  Filled 2016-08-17: qty 2

## 2016-08-17 MED ORDER — ONDANSETRON HCL 4 MG PO TABS
4.0000 mg | ORAL_TABLET | Freq: Four times a day (QID) | ORAL | Status: DC | PRN
  Administered 2016-08-21 (×2): 4 mg via ORAL
  Filled 2016-08-17 (×2): qty 1

## 2016-08-17 MED ORDER — 0.9 % SODIUM CHLORIDE (POUR BTL) OPTIME
TOPICAL | Status: DC | PRN
  Administered 2016-08-17: 1000 mL

## 2016-08-17 MED ORDER — FENTANYL CITRATE (PF) 100 MCG/2ML IJ SOLN
INTRAMUSCULAR | Status: AC
  Administered 2016-08-17: 50 ug via INTRAVENOUS
  Filled 2016-08-17: qty 2

## 2016-08-17 MED ORDER — DARBEPOETIN ALFA 200 MCG/0.4ML IJ SOSY
PREFILLED_SYRINGE | INTRAMUSCULAR | Status: AC
  Administered 2016-08-17: 200 ug via INTRAVENOUS
  Filled 2016-08-17: qty 0.4

## 2016-08-17 MED ORDER — GLYCOPYRROLATE 0.2 MG/ML IJ SOLN
INTRAMUSCULAR | Status: DC | PRN
  Administered 2016-08-17: 0.2 mg via INTRAVENOUS

## 2016-08-17 MED ORDER — SODIUM CHLORIDE 0.9 % IV SOLN
INTRAVENOUS | Status: DC
Start: 2016-08-17 — End: 2016-08-17
  Administered 2016-08-17 (×2): via INTRAVENOUS

## 2016-08-17 MED ORDER — SUCCINYLCHOLINE CHLORIDE 20 MG/ML IJ SOLN
INTRAMUSCULAR | Status: DC | PRN
  Administered 2016-08-17: 100 mg via INTRAVENOUS

## 2016-08-17 MED ORDER — LIDOCAINE 2% (20 MG/ML) 5 ML SYRINGE
INTRAMUSCULAR | Status: AC
  Filled 2016-08-17: qty 5

## 2016-08-17 MED ORDER — PHENOL 1.4 % MT LIQD: 1.0000 | OROMUCOSAL | Status: DC | PRN

## 2016-08-17 MED ORDER — ONDANSETRON HCL 4 MG/2ML IJ SOLN
INTRAMUSCULAR | Status: AC
  Filled 2016-08-17: qty 6

## 2016-08-17 MED ORDER — OXYCODONE HCL 5 MG/5ML PO SOLN: 5.0000 mg | Freq: Once | ORAL | Status: DC | PRN

## 2016-08-17 MED ORDER — MORPHINE SULFATE (PF) 2 MG/ML IV SOLN: 2.0000 mg | INTRAVENOUS | Status: DC | PRN

## 2016-08-17 MED ORDER — CEFAZOLIN SODIUM-DEXTROSE 2-3 GM-% IV SOLR
INTRAVENOUS | Status: DC | PRN
  Administered 2016-08-17: 2 g via INTRAVENOUS

## 2016-08-17 MED ORDER — PHENYLEPHRINE HCL 10 MG/ML IJ SOLN
INTRAVENOUS | Status: DC | PRN
  Administered 2016-08-17: 25 ug/min via INTRAVENOUS

## 2016-08-17 MED ORDER — PROPOFOL 10 MG/ML IV BOLUS
INTRAVENOUS | Status: AC
  Filled 2016-08-17: qty 20

## 2016-08-17 MED ORDER — ACETAMINOPHEN 325 MG PO TABS
650.0000 mg | ORAL_TABLET | Freq: Four times a day (QID) | ORAL | Status: DC | PRN
  Administered 2016-08-17 – 2016-08-20 (×4): 650 mg via ORAL
  Filled 2016-08-17 (×5): qty 2

## 2016-08-17 MED ORDER — MENTHOL 3 MG MT LOZG: 1.0000 | LOZENGE | OROMUCOSAL | Status: DC | PRN

## 2016-08-17 MED ORDER — SODIUM CHLORIDE 0.9 % IV SOLN
Freq: Once | INTRAVENOUS | Status: DC
Start: 2016-08-17 — End: 2016-08-22

## 2016-08-17 MED ORDER — METOCLOPRAMIDE HCL 10 MG PO TABS
5.0000 mg | ORAL_TABLET | Freq: Three times a day (TID) | ORAL | Status: DC | PRN
  Administered 2016-08-20: 5 mg via ORAL
  Filled 2016-08-17: qty 1

## 2016-08-17 MED ORDER — FENTANYL CITRATE (PF) 100 MCG/2ML IJ SOLN
25.0000 ug | INTRAMUSCULAR | Status: DC | PRN
  Administered 2016-08-17: 25 ug via INTRAVENOUS
  Administered 2016-08-17: 50 ug via INTRAVENOUS
  Administered 2016-08-17: 25 ug via INTRAVENOUS

## 2016-08-17 MED ORDER — BUPIVACAINE HCL (PF) 0.25 % IJ SOLN
INTRAMUSCULAR | Status: DC | PRN
  Administered 2016-08-17: 20 mL

## 2016-08-17 MED ORDER — OXYCODONE HCL 5 MG PO TABS: 5.0000 mg | ORAL_TABLET | Freq: Once | ORAL | Status: DC | PRN

## 2016-08-17 MED ORDER — METOCLOPRAMIDE HCL 5 MG/ML IJ SOLN: 5.0000 mg | Freq: Three times a day (TID) | INTRAMUSCULAR | Status: DC | PRN

## 2016-08-17 MED ORDER — ONDANSETRON HCL 4 MG/2ML IJ SOLN
INTRAMUSCULAR | Status: DC | PRN
  Administered 2016-08-17: 4 mg via INTRAVENOUS

## 2016-08-17 MED ORDER — ENOXAPARIN SODIUM 30 MG/0.3ML ~~LOC~~ SOLN
30.0000 mg | SUBCUTANEOUS | Status: DC
  Administered 2016-08-18 – 2016-08-22 (×5): 30 mg via SUBCUTANEOUS
  Filled 2016-08-17 (×5): qty 0.3

## 2016-08-17 MED ORDER — ONDANSETRON HCL 4 MG/2ML IJ SOLN
4.0000 mg | Freq: Four times a day (QID) | INTRAMUSCULAR | Status: DC | PRN
  Administered 2016-08-20: 4 mg via INTRAVENOUS
  Filled 2016-08-17: qty 2

## 2016-08-17 MED ORDER — ROCURONIUM BROMIDE 100 MG/10ML IV SOLN
INTRAVENOUS | Status: DC | PRN
  Administered 2016-08-17: 25 mg via INTRAVENOUS

## 2016-08-17 MED ORDER — ROCURONIUM BROMIDE 50 MG/5ML IV SOSY
PREFILLED_SYRINGE | INTRAVENOUS | Status: AC
  Filled 2016-08-17: qty 5

## 2016-08-17 MED ORDER — MIDAZOLAM HCL 5 MG/5ML IJ SOLN
INTRAMUSCULAR | Status: DC | PRN
  Administered 2016-08-17: 1 mg via INTRAVENOUS

## 2016-08-17 MED ORDER — MIDAZOLAM HCL 2 MG/2ML IJ SOLN
INTRAMUSCULAR | Status: AC
  Filled 2016-08-17: qty 2

## 2016-08-17 MED ORDER — FENTANYL CITRATE (PF) 100 MCG/2ML IJ SOLN
INTRAMUSCULAR | Status: DC | PRN
  Administered 2016-08-17: 100 ug via INTRAVENOUS

## 2016-08-17 MED ORDER — GLIPIZIDE 5 MG PO TABS
5.0000 mg | ORAL_TABLET | Freq: Every day | ORAL | Status: DC
  Administered 2016-08-18 – 2016-08-22 (×4): 5 mg via ORAL
  Filled 2016-08-17 (×6): qty 1

## 2016-08-17 MED ORDER — ONDANSETRON HCL 4 MG/2ML IJ SOLN: 4.0000 mg | Freq: Once | INTRAMUSCULAR | Status: DC | PRN

## 2016-08-17 MED ORDER — FENTANYL CITRATE (PF) 250 MCG/5ML IJ SOLN
INTRAMUSCULAR | Status: AC
  Filled 2016-08-17: qty 5

## 2016-08-17 MED ORDER — ALBUMIN HUMAN 5 % IV SOLN
12.5000 g | Freq: Once | INTRAVENOUS | Status: AC
  Administered 2016-08-17: 12.5 g via INTRAVENOUS

## 2016-08-17 MED ORDER — PROPOFOL 10 MG/ML IV BOLUS
INTRAVENOUS | Status: DC | PRN
  Administered 2016-08-17: 80 mg via INTRAVENOUS

## 2016-08-17 MED ORDER — ACETAMINOPHEN 650 MG RE SUPP: 650.0000 mg | Freq: Four times a day (QID) | RECTAL | Status: DC | PRN

## 2016-08-17 MED ORDER — LAMIVUDINE 10 MG/ML PO SOLN: 25.0000 mg | Freq: Every day | ORAL | Status: DC

## 2016-08-17 MED ORDER — ALBUMIN HUMAN 5 % IV SOLN
INTRAVENOUS | Status: AC
  Administered 2016-08-17: 12.5 g via INTRAVENOUS
  Filled 2016-08-17: qty 250

## 2016-08-17 MED ORDER — NEOSTIGMINE METHYLSULFATE 10 MG/10ML IV SOLN
INTRAVENOUS | Status: DC | PRN
  Administered 2016-08-17: 2 mg via INTRAVENOUS

## 2016-08-17 MED ORDER — OXYCODONE HCL 5 MG PO TABS
5.0000 mg | ORAL_TABLET | ORAL | Status: DC | PRN
  Administered 2016-08-19: 5 mg via ORAL
  Filled 2016-08-17: qty 1

## 2016-08-17 MED ORDER — PHENYLEPHRINE HCL 10 MG/ML IJ SOLN
INTRAMUSCULAR | Status: DC | PRN
  Administered 2016-08-17 (×3): 80 ug via INTRAVENOUS

## 2016-08-17 SURGICAL SUPPLY — 67 items
BANDAGE ACE 4X5 VEL STRL LF (GAUZE/BANDAGES/DRESSINGS) ×2 IMPLANT
BANDAGE ACE 6X5 VEL STRL LF (GAUZE/BANDAGES/DRESSINGS) ×2 IMPLANT
BANDAGE ELASTIC 4 VELCRO ST LF (GAUZE/BANDAGES/DRESSINGS) ×1 IMPLANT
BANDAGE ELASTIC 6 VELCRO ST LF (GAUZE/BANDAGES/DRESSINGS) ×2 IMPLANT
BANDAGE ESMARK 6X9 LF (GAUZE/BANDAGES/DRESSINGS) IMPLANT
BIT DRILL CALIBRATED 4.3MMX365 (DRILL) IMPLANT
BIT DRILL CROWE PNT TWST 4.5MM (DRILL) IMPLANT
BLADE CLIPPER SURG (BLADE) IMPLANT
BLADE SURG 15 STRL LF DISP TIS (BLADE) ×1 IMPLANT
BLADE SURG 15 STRL SS (BLADE) ×2
BNDG CMPR 9X6 STRL LF SNTH (GAUZE/BANDAGES/DRESSINGS)
BNDG COHESIVE 6X5 TAN STRL LF (GAUZE/BANDAGES/DRESSINGS) ×2 IMPLANT
BNDG ESMARK 6X9 LF (GAUZE/BANDAGES/DRESSINGS)
BNDG GAUZE ELAST 4 BULKY (GAUZE/BANDAGES/DRESSINGS) ×2 IMPLANT
COVER SURGICAL LIGHT HANDLE (MISCELLANEOUS) ×4 IMPLANT
CUFF TOURNIQUET SINGLE 34IN LL (TOURNIQUET CUFF) IMPLANT
CUFF TOURNIQUET SINGLE 44IN (TOURNIQUET CUFF) IMPLANT
DRAPE C-ARM 42X72 X-RAY (DRAPES) ×2 IMPLANT
DRAPE IMP U-DRAPE 54X76 (DRAPES) ×2 IMPLANT
DRAPE ORTHO SPLIT 77X108 STRL (DRAPES) ×4
DRAPE PROXIMA HALF (DRAPES) ×4 IMPLANT
DRAPE SURG ORHT 6 SPLT 77X108 (DRAPES) ×2 IMPLANT
DRAPE U-SHAPE 47X51 STRL (DRAPES) ×2 IMPLANT
DRILL CALIBRATED 4.3MMX365 (DRILL) ×2
DRILL CROWE POINT TWIST 4.5MM (DRILL) ×2
DURAPREP 26ML APPLICATOR (WOUND CARE) ×2 IMPLANT
ELECT REM PT RETURN 9FT ADLT (ELECTROSURGICAL) ×2
ELECTRODE REM PT RTRN 9FT ADLT (ELECTROSURGICAL) ×1 IMPLANT
FACESHIELD WRAPAROUND (MASK) IMPLANT
FACESHIELD WRAPAROUND OR TEAM (MASK) IMPLANT
GAUZE SPONGE 4X4 12PLY STRL (GAUZE/BANDAGES/DRESSINGS) ×4 IMPLANT
GAUZE SPONGE 4X4 12PLY STRL LF (GAUZE/BANDAGES/DRESSINGS) ×1 IMPLANT
GAUZE XEROFORM 5X9 LF (GAUZE/BANDAGES/DRESSINGS) ×2 IMPLANT
GLOVE BIOGEL PI IND STRL 8 (GLOVE) ×2 IMPLANT
GLOVE BIOGEL PI INDICATOR 8 (GLOVE) ×2
GLOVE ORTHO TXT STRL SZ7.5 (GLOVE) ×4 IMPLANT
GOWN STRL REUS W/ TWL LRG LVL3 (GOWN DISPOSABLE) ×1 IMPLANT
GOWN STRL REUS W/ TWL XL LVL3 (GOWN DISPOSABLE) ×1 IMPLANT
GOWN STRL REUS W/TWL 2XL LVL3 (GOWN DISPOSABLE) ×2 IMPLANT
GOWN STRL REUS W/TWL LRG LVL3 (GOWN DISPOSABLE) ×2
GOWN STRL REUS W/TWL XL LVL3 (GOWN DISPOSABLE) ×2
GUIDEPIN 3.2X17.5 THRD DISP (PIN) ×1 IMPLANT
GUIDEWIRE BEAD TIP (WIRE) ×2 IMPLANT
KIT BASIN OR (CUSTOM PROCEDURE TRAY) ×2 IMPLANT
KIT ROOM TURNOVER OR (KITS) ×2 IMPLANT
MANIFOLD NEPTUNE II (INSTRUMENTS) ×2 IMPLANT
NAIL FEM RETRO 9X300 (Nail) ×1 IMPLANT
NS IRRIG 1000ML POUR BTL (IV SOLUTION) ×2 IMPLANT
PACK GENERAL/GYN (CUSTOM PROCEDURE TRAY) ×2 IMPLANT
PACK UNIVERSAL I (CUSTOM PROCEDURE TRAY) ×2 IMPLANT
PAD ARMBOARD 7.5X6 YLW CONV (MISCELLANEOUS) ×4 IMPLANT
PAD CAST 4YDX4 CTTN HI CHSV (CAST SUPPLIES) IMPLANT
PADDING CAST COTTON 4X4 STRL (CAST SUPPLIES) ×2
PADDING CAST COTTON 6X4 STRL (CAST SUPPLIES) ×1 IMPLANT
SCREW CORT TI DBL LEAD 5X30 (Screw) ×2 IMPLANT
SCREW CORT TI DBL LEAD 5X65 (Screw) ×2 IMPLANT
SCREW CORT TI DBL LEAD 5X70 (Screw) ×2 IMPLANT
STAPLER VISISTAT 35W (STAPLE) ×2 IMPLANT
STOCKINETTE IMPERVIOUS LG (DRAPES) ×2 IMPLANT
SUT VIC AB 0 CT1 27 (SUTURE) ×2
SUT VIC AB 0 CT1 27XBRD ANBCTR (SUTURE) ×1 IMPLANT
SUT VIC AB 2-0 CT1 27 (SUTURE) ×2
SUT VIC AB 2-0 CT1 TAPERPNT 27 (SUTURE) ×1 IMPLANT
TOWEL OR 17X24 6PK STRL BLUE (TOWEL DISPOSABLE) ×2 IMPLANT
TOWEL OR 17X26 10 PK STRL BLUE (TOWEL DISPOSABLE) ×2 IMPLANT
TRAY FOLEY W/METER SILVER 16FR (SET/KITS/TRAYS/PACK) IMPLANT
WATER STERILE IRR 1000ML POUR (IV SOLUTION) ×2 IMPLANT

## 2016-08-17 NOTE — Progress Notes (Signed)
Patient received 2 unit of PRBCs. Patient tolerated transfusion well. No s/s of transfusion reaction.

## 2016-08-17 NOTE — Op Note (Signed)
Preop diagnosis left supracondylar the femur fracture without intercondylar extension, closed.  Postop diagnosis: Same  Procedure: Retrograde the locking femoral nail 300 x 9 mm Biomet.  Surgeon: Rodell Perna M.D.  Anesthesia: Gen.  Consultations none.  Brief history this the 46 year old male has followed by a wheelchair after a BKA and suffered a supracondylar distal femur closed fracture. He waited a few days where she the hospital. X-ray showed a displaced comminuted fracture.  Seizure Center prepping and draping from the tip of the stump up to the groin with DuraPrep preoperative Ancef prophylaxis split sheets drapes timeout procedure was performed. Incision was made over the patellar tendon Rozann Lesches was split soaked in retractor was placed that was cleaned out so the notch was visualized pin was drilled up checked. The finger retractor was used sliding over the beaded tip ride pushing some the anterior cortical pieces back up which it crashed in the canal narrowing the canal down 50%. These were gently pushed up and pushed back and the position

## 2016-08-17 NOTE — H&P (View-Only) (Signed)
Reason for Consult:Left supracondylar femur fx Referring Physician: S Rancour  Alvin Daniels is an 46 y.o. male.  HPI: Alvin Daniels was at home 4d ago when he fell while ambulating with his walker. He chose not to seek care until now as it was still hurting and swollen. Work-up revealed a supracondylar femur fracture and orthopedic surgery was consulted. He was admitted to IM given his multiple medical problems. Visit was conducted with aid of interpreter though patient speaks passable [place].  Past Medical History:  Diagnosis Date  . AIDS ([place]) 11/22/2014  . Chronic diarrhea   . Chronic hepatitis C without hepatic coma ([place]) 11/22/2014  . Diabetic neuropathy ([place])   . ESRD (end stage renal disease) on dialysis [facility])    "TTS; don't remember street name" (05/03/2014)  . Hepatitis C   . HIV INFECTION 06/27/2010   Qualifier: Diagnosis of  By: Nickola Major CMA ( [place]), Geni Bers    . Hypotension 06/02/2012  . Metabolic bone disease 10/18/1273  . MRSA infection   . Normocytic anemia 06/17/2012  . Pancreatitis   . Pressure ulcer of BKA stump, stage 2 ([place]) 11/22/2014  . Renal disorder   . Severe protein-calorie malnutrition ([place]) 06/17/2012  . Uncontrolled diabetes mellitus with complications ([place]) 1/70/0174   Annotation: uncontrolled Qualifier: Diagnosis of  By: Nickola Major CMA Deborra Medina), Geni Bers      Past Surgical History:  Procedure Laterality Date  . AMPUTATION Left 04/20/2014   Procedure: 3rd toe amputation, 4th Toe Amputation,  5th Toe Amputation;  Surgeon: Newt Minion, MD;  Location: [place];  Service: Orthopedics;  Laterality: Left;  . AMPUTATION Left 05/02/2014   Procedure: Midfoot Amputation;  Surgeon: Newt Minion, MD;  Location: [place];  Service: Orthopedics;  Laterality: Left;  . AMPUTATION Left 06/17/2014   Procedure: AMPUTATION BELOW KNEE;  Surgeon: Newt Minion, MD;  Location: [place];  Service: Orthopedics;  Laterality: Left;  . AV FISTULA PLACEMENT Left   . AV FISTULA  PLACEMENT Left 05/10/2016   Procedure: Creation Left Arm Brachiocephalic Arteriovenous Fistula and Ligation of Radiocephalic Fistula;  Surgeon: Angelia Mould, MD;  Location: [place];  Service: Vascular;  Laterality: Left;  . FOOT AMPUTATION THROUGH ANKLE Left 12/'21/2015   midfoot  . IR GENERIC HISTORICAL Left 05/01/2016   IR THROMBECTOMY AV FISTULA W/THROMBOLYSIS/PTA INC/SHUNT/IMG LEFT 05/01/2016 [name], MD [facility]INTER[facility]  . IR GENERIC HISTORICAL  05/01/2016   IR US GUIDE VASC ACCESS LEFT 05/01/2016 [name], MD [facility]  . IR GENERIC HISTORICAL  05/07/2016   IR FLUORO GUIDE CV LINE RIGHT 05/07/2016 Corrie Mckusick, DO [facility]  . IR GENERIC HISTORICAL  05/07/2016   IR US GUIDE VASC ACCESS RIGHT 05/07/2016 Corrie Mckusick, DO [facility]  . IR GENERIC HISTORICAL  05/22/2016   IR US GUIDE VASC ACCESS RIGHT 05/22/2016 Greggory Keen, MD [facility]  . IR GENERIC HISTORICAL  05/22/2016   IR FLUORO GUIDE CV LINE RIGHT 05/22/2016 Greggory Keen, MD [facility]  . PERIPHERAL VASCULAR CATHETERIZATION Left 05/09/2016   Procedure: A/V Fistulagram;  Surgeon: Angelia Mould, MD;  Location: [place] CV LAB;  Service: Cardiovascular;  Laterality: Left;  arm    Family History  Problem Relation Age of Onset  . Diabetes Mother   . Diabetes Father     Social History:  reports that he has never smoked. He has never used smokeless tobacco. He reports that he does not drink alcohol or use drugs.  Allergies: No Known Allergies  Medications: I  have reviewed the patient's current medications.  Results for orders placed or performed during the hospital encounter of 08/15/16 (from the past 48 hour(s))  Basic metabolic panel     Status: Abnormal   Collection Time: 08/15/16 10:30 PM  Result Value Ref Range   Sodium 134 (L) 135 - 145 mmol/L   Potassium 3.1 (L) 3.5 - 5.1 mmol/L   Chloride 94 (L) 101 - 111 mmol/L   CO2 28 22 - 32 mmol/L   Glucose, Bld 195 (H) 65 - 99 mg/dL    BUN 26 (H) 6 - 20 mg/dL   Creatinine, Ser 5.70 (H) 0.61 - 1.24 mg/dL   Calcium 7.8 (L) 8.9 - 10.3 mg/dL   GFR calc non Af Amer 11 (L) >60 mL/min   GFR calc Af Amer 13 (L) >60 mL/min    Comment: (NOTE) The eGFR has been calculated using the CKD EPI equation. This calculation has not been validated in all clinical situations. eGFR's persistently <60 mL/min signify possible Chronic Kidney Disease.    Anion gap 12 5 - 15  CBC     Status: Abnormal   Collection Time: 08/15/16 10:30 PM  Result Value Ref Range   WBC 9.0 4.0 - 10.5 K/uL   RBC 2.40 (L) 4.22 - 5.81 MIL/uL   Hemoglobin 7.9 (L) 13.0 - 17.0 g/dL   HCT 23.9 (L) 39.0 - 52.0 %   MCV 99.6 78.0 - 100.0 fL   MCH 32.9 26.0 - 34.0 pg   MCHC 33.1 30.0 - 36.0 g/dL   RDW 14.2 11.5 - 15.5 %   Platelets 169 150 - 400 K/uL  CK     Status: None   Collection Time: 08/15/16 10:30 PM  Result Value Ref Range   Total CK 79 49 - 397 U/L  Sedimentation rate     Status: Abnormal   Collection Time: 08/15/16 10:30 PM  Result Value Ref Range   Sed Rate 132 (H) 0 - 16 mm/hr  C-reactive protein     Status: Abnormal   Collection Time: 08/15/16 10:30 PM  Result Value Ref Range   CRP 28.3 (H) <1.0 mg/dL  I-Stat CG4 Lactic Acid, ED     Status: None   Collection Time: 08/15/16 10:44 PM  Result Value Ref Range   Lactic Acid, Venous 1.72 0.5 - 1.9 mmol/L  Magnesium     Status: None   Collection Time: 08/16/16  1:49 AM  Result Value Ref Range   Magnesium 2.2 1.7 - 2.4 mg/dL  Basic metabolic panel     Status: Abnormal   Collection Time: 08/16/16  1:49 AM  Result Value Ref Range   Sodium 137 135 - 145 mmol/L   Potassium 3.3 (L) 3.5 - 5.1 mmol/L   Chloride 98 (L) 101 - 111 mmol/L   CO2 26 22 - 32 mmol/L   Glucose, Bld 183 (H) 65 - 99 mg/dL   BUN 27 (H) 6 - 20 mg/dL   Creatinine, Ser 5.78 (H) 0.61 - 1.24 mg/dL   Calcium 7.6 (L) 8.9 - 10.3 mg/dL   GFR calc non Af Amer 11 (L) >60 mL/min   GFR calc Af Amer 12 (L) >60 mL/min    Comment:  (NOTE) The eGFR has been calculated using the CKD EPI equation. This calculation has not been validated in all clinical situations. eGFR's persistently <60 mL/min signify possible Chronic Kidney Disease.    Anion gap 13 5 - 15  CBC     Status: Abnormal  Collection Time: 08/16/16  1:49 AM  Result Value Ref Range   WBC 8.1 4.0 - 10.5 K/uL   RBC 2.27 (L) 4.22 - 5.81 MIL/uL   Hemoglobin 7.5 (L) 13.0 - 17.0 g/dL   HCT 22.7 (L) 39.0 - 52.0 %   MCV 100.0 78.0 - 100.0 fL   MCH 33.0 26.0 - 34.0 pg   MCHC 33.0 30.0 - 36.0 g/dL   RDW 14.1 11.5 - 15.5 %   Platelets 134 (L) 150 - 400 K/uL  Protime-INR     Status: Abnormal   Collection Time: 08/16/16  1:49 AM  Result Value Ref Range   Prothrombin Time 16.0 (H) 11.4 - 15.2 seconds   INR 1.27   APTT     Status: Abnormal   Collection Time: 08/16/16  1:49 AM  Result Value Ref Range   aPTT 45 (H) 24 - 36 seconds    Comment:        IF BASELINE aPTT IS ELEVATED, SUGGEST PATIENT RISK ASSESSMENT BE USED TO DETERMINE APPROPRIATE ANTICOAGULANT THERAPY.   CBG monitoring, ED     Status: Abnormal   Collection Time: 08/16/16  1:54 AM  Result Value Ref Range   Glucose-Capillary 168 (H) 65 - 99 mg/dL  Type and screen [place]     Status: None   Collection Time: 08/16/16  1:56 AM  Result Value Ref Range   ABO/RH(D) O POS    Antibody Screen NEG    Sample Expiration 08/19/2016   MRSA PCR Screening     Status: None   Collection Time: 08/16/16  4:24 AM  Result Value Ref Range   MRSA by PCR NEGATIVE NEGATIVE    Comment:        The GeneXpert MRSA Assay (FDA approved for NASAL specimens only), is one component of a comprehensive MRSA colonization surveillance program. It is not intended to diagnose MRSA infection nor to guide or monitor treatment for MRSA infections.   CBC     Status: Abnormal   Collection Time: 08/16/16  4:35 AM  Result Value Ref Range   WBC 7.7 4.0 - 10.5 K/uL   RBC 2.18 (L) 4.22 - 5.81 MIL/uL    Hemoglobin 7.1 (L) 13.0 - 17.0 g/dL   HCT 21.9 (L) 39.0 - 52.0 %   MCV 100.5 (H) 78.0 - 100.0 fL   MCH 32.6 26.0 - 34.0 pg   MCHC 32.4 30.0 - 36.0 g/dL   RDW 14.3 11.5 - 15.5 %   Platelets 142 (L) 150 - 400 K/uL  CBC     Status: Abnormal   Collection Time: 08/16/16 10:48 AM  Result Value Ref Range   WBC 7.1 4.0 - 10.5 K/uL   RBC 2.16 (L) 4.22 - 5.81 MIL/uL   Hemoglobin 7.0 (L) 13.0 - 17.0 g/dL   HCT 21.9 (L) 39.0 - 52.0 %   MCV 101.4 (H) 78.0 - 100.0 fL   MCH 32.4 26.0 - 34.0 pg   MCHC 32.0 30.0 - 36.0 g/dL   RDW 14.2 11.5 - 15.5 %   Platelets 135 (L) 150 - 400 K/uL    Dg Chest 2 View  Result Date: 08/16/2016 CLINICAL DATA:  Patient fell and found to be hypotensive. EXAM: CHEST  2 VIEW COMPARISON:  CXR 05/08/2016 FINDINGS: The heart size and mediastinal contours are within normal limits. Right IJ dialysis catheter tip in the proximal right atrium. Both lungs are clear. The visualized skeletal structures are unremarkable. IMPRESSION: No active cardiopulmonary disease. Dialysis  catheter tip in right atrium. No acute osseous appearing abnormality of the bony thorax. Electronically Signed   By: Ashley Royalty M.D.   On: 08/16/2016 00:00   Dg Femur Min 2 Views Left  Result Date: 08/15/2016 CLINICAL DATA:  Patient fell from a walker complaining of left knee pain EXAM: LEFT FEMUR 2 VIEWS COMPARISON:  None. FINDINGS: There is an acute, closed, comminuted impacted supracondylar fracture of the left femur with 1/4 shaft with dorsal displacement of the distal fracture fragments. There is chondrocalcinosis with femorotibial joint space narrowing. Below-knee amputation with intact surgical margins. Extensive calcific arteriosclerosis of the visualized femoral through tibial arteries. IMPRESSION: Acute, closed, comminuted and impacted supracondylar fracture of the left femur with 1/4 shaft width dorsal displacement of the distal fracture fragments. Status post BKA. Electronically Signed   By: Ashley Royalty  M.D.   On: 08/15/2016 23:59    Review of Systems  Constitutional: Negative for weight loss.  HENT: Negative for ear discharge, ear pain, hearing loss and tinnitus.   Eyes: Negative for blurred vision, double vision, photophobia and pain.  Respiratory: Negative for cough, sputum production and shortness of breath.   Cardiovascular: Negative for chest pain.  Gastrointestinal: Negative for abdominal pain, nausea and vomiting.  Genitourinary: Negative for dysuria, flank pain, frequency and urgency.  Musculoskeletal: Positive for joint pain (Left thigh/knee). Negative for back pain, falls, myalgias and neck pain.  Neurological: Negative for dizziness, tingling, sensory change, focal weakness, loss of consciousness and headaches.  Endo/Heme/Allergies: Does not bruise/bleed easily.  Psychiatric/Behavioral: Negative for depression, memory loss and substance abuse. The patient is not nervous/anxious.    Blood pressure 97/65, pulse 99, temperature 98.9 F (37.2 C), temperature source Oral, resp. rate (!) 22, height _0  (1.626 m), weight 62.6 kg (138 lb), SpO2 96 %. Physical Exam  Constitutional: He appears well-developed and well-nourished. No distress.  HENT:  Head: Normocephalic and atraumatic.  Eyes: Conjunctivae are normal. Right eye exhibits no discharge. Left eye exhibits no discharge. No scleral icterus.  Neck: Normal range of motion. Neck supple.  Cardiovascular: Normal rate, regular rhythm and normal heart sounds.  Exam reveals no gallop and no friction rub.   No murmur heard. Respiratory: Effort normal and breath sounds normal. No respiratory distress. He has no wheezes. He has no rales.  GI: Soft.  Musculoskeletal:  UEx shoulder, elbow, wrist, digits- no skin wounds, nontender, no instability, no blocks to motion  Sens  Ax/R/M/U intact  Mot   Ax/ R/ PIN/ M/ AIN/ U intact  Rad 2+  RLE No traumatic wounds, ecchymosis, or rash  Nontender  No effusions  Knee stable to varus/  valgus and anterior/posterior stress  Sens DPN, SPN, TN intact  Motor EHL, ext, flex, evers 5/5  DP 1+, PT 0, No significant edema              Pigment changes in toes, ulcer on heel   LLE No traumatic wounds, ecchymosis, or rash  TTP distal thigh             s/p BKA     Lymphadenopathy:    He has no cervical adenopathy.  Skin: He is not diaphoretic.    Assessment/Plan: Fall Left supracondylar femur fx -- For retrograde IMN by Dr. Lorin Mercy today. Continue NPO. He is currently NWB on this extremity as he has no prosthesis. There is someone in [place] who is helping him try to get one at no cost. He is very worried about losing more  of the leg; I reassured him we would not amputate any more with this surgery. Multiple medical problems -- per primary service    Lisette Abu, PA-C Orthopedic Surgery [phone] 08/16/2016, 11:26 AM

## 2016-08-17 NOTE — Progress Notes (Signed)
Patient ID: Alvin Daniels, male   DOB: [birth year], 46 y.o.   MRN: [id number]  surgery later today for femur fracture stabilization. He has anemia with drop in Hgb. Surgery done without a tournaquet. Could get 2 units at dialysis if OK with Nephrologist which would help .  My cell [phone].   Discussed surgery again with patient.

## 2016-08-17 NOTE — Transfer of Care (Signed)
Immediate Anesthesia Transfer of Care Note  Patient: Whittaker Szilagyi  Procedure(s) Performed: Procedure(s): INTRAMEDULLARY (IM) RETROGRADE FEMORAL NAILING (Left)  Patient Location: PACU  Anesthesia Type:General  Level of Consciousness: awake, alert  and oriented  Airway & Oxygen Therapy: Patient Spontanous Breathing and Patient connected to nasal cannula oxygen  Post-op Assessment: Report given to RN and Post -op Vital signs reviewed and stable  Post vital signs: Reviewed and stable  Last Vitals:  Vitals:   08/17/16 1151 08/17/16 1240  BP: 126/67 (!) 111/59  Pulse: 89 92  Resp: 13 18  Temp: 37 C 37.1 C    Last Pain:  Vitals:   08/17/16 1240  TempSrc: Oral  PainSc:       Patients Stated Pain Goal: 0 (42/76/70 1100)  Complications: No apparent anesthesia complications

## 2016-08-17 NOTE — Anesthesia Preprocedure Evaluation (Signed)
Anesthesia Evaluation  Patient identified by MRN, date of birth, ID band Patient awake    Reviewed: Allergy & Precautions, NPO status , Patient's Chart, lab work & pertinent test results, reviewed documented beta blocker date and time   Airway Mallampati: II  TM Distance: >3 FB Neck ROM: Full    Dental  (+) Teeth Intact   Pulmonary    breath sounds clear to auscultation       Cardiovascular  Rhythm:Regular Rate:Normal     Neuro/Psych    GI/Hepatic (+) Hepatitis -, C  Endo/Other  diabetes  Renal/GU ESRF and DialysisRenal disease     Musculoskeletal   Abdominal   Peds  Hematology   Anesthesia Other Findings   Reproductive/Obstetrics                             Anesthesia Physical Anesthesia Plan  ASA: III  Anesthesia Plan: General   Post-op Pain Management:    Induction: Intravenous  Airway Management Planned: Oral ETT  Additional Equipment:   Intra-op Plan:   Post-operative Plan: Extubation in OR  Informed Consent:   Dental advisory given  Plan Discussed with: CRNA, Surgeon and Anesthesiologist  Anesthesia Plan Comments:         Anesthesia Quick Evaluation

## 2016-08-17 NOTE — Progress Notes (Signed)
PROGRESS NOTE    Alvin Daniels  HQI:[id number] DOB: [birth year] DOA: 08/15/2016 PCP: [name], MD   Brief Narrative: 46 y.o. male with medical history significant of diabetes mellitus, GERD, pancreatitis, HCV, HIV (350 on 07/24/2016), ESRD-HD (TTS), chronic diarrhea, s/p of L BKA, chronic hypotension (baseline SBP at ~80s), whopresents with left leg pain after fall.  In the ER patient was found to have hypertension, low hemoglobin and x-ray of left femur showed acute closed comminuted and impacted supracondylar fracture of the left femur. Orthopedic was consulted and patient was admitted for further evaluation.  Assessment & Plan:  # Left  Supracondylar femur fracture after the fall: -Orthopedic consult appreciated. Plan for surgical intervention today. Currently on nothing by mouth.  #Chronic hypotension and in ESRD patient: Low blood pressure likely contributed by vasculopathy. Patient is clinically asymptomatic. Reported that his baseline blood pressure is systolic 13K. Continue midodrine. Continue to monitor blood pressure. No IV fluid because of ESRD.  #ESRD on hemodialysis: Patient is Tuesday Thursday Saturday schedule. Nephrology consult appreciated. Receiving dialysis today.  #Anemia of chronic kidney disease: No sign of active bleeding. Monitor CBC. IV iron and ESA during dialysis defer to nephrologist -Hb 7.7 Transfusing 2 units of PRC today during dialysis. Order placed and discussed with the nurse.   #Hypokalemia in ESRD patient: Monitor electrolytes. K 4.2 today.  #HIV disease: Continue antiviral medication. Recommended to follow-up with ID outpatient.  #Chronic thrombocytopenia: Monitor platelet counts.  Principal Problem:   Femur fracture, left ([name]) Active Problems:   Human immunodeficiency virus (HIV) disease ([place])   Diabetes mellitus with ESRD (end-stage renal disease) ([place])   Anemia of chronic renal failure   ESRD on dialysis ([name])   GERD  (gastroesophageal reflux disease)   Hypokalemia   HIV (human immunodeficiency virus infection) ([place])  DVT prophylaxis: SCD. Patient is going to surgery today. Code Status: Full code Family Communication: No family present at bedside Disposition Plan: Current inpatient    Consultants:   Orthopedics  Procedures: None Antimicrobials: None  Subjective: Patient was seen and examined at dialysis. Denied headache, dizziness, chest pain, shortness of breath, nausea or vomiting. Plan for or today.  Objective: Vitals:   08/17/16 0945 08/17/16 0950 08/17/16 1000 08/17/16 1015  BP: (!) 80/45 (!) 81/49 (!) 92/55 (!) 86/54  Pulse: 78 78 79 81  Resp:    13  Temp: 98.2 F (36.8 C) 98.3 F (36.8 C) 98.3 F (36.8 C) 98.3 F (36.8 C)  TempSrc: Oral Oral Oral Oral  SpO2:      Weight:      Height:        Intake/Output Summary (Last 24 hours) at 08/17/16 1025 Last data filed at 08/16/16 1200  Gross per 24 hour  Intake                0 ml  Output                0 ml  Net                0 ml   Filed Weights   08/15/16 2223 08/17/16 0736  Weight: 62.6 kg (138 lb) 45.1 kg (99 lb 6.8 oz)    Examination:  General exam: Not in distress, lying on bed comfortable receiving dialysis treatment  Respiratory system: Clear to auscultation bilateral. Respiratory effort normal. No wheezing or crackle Cardiovascular system: S1 & S2 heard, RRR.  Gastrointestinal system: Abdomen is nondistended, soft and nontender. Normal bowel sounds heard. Central  nervous system: Alert and oriented. No focal neurological deficits. Extremities: Left leg has a cast applied no sign of active bleeding. Unchanged Skin: No rashes, lesions or ulcers Psychiatry: Judgement and insight appear normal. Mood & affect appropriate.     Data Reviewed: I have personally reviewed following labs and imaging studies  CBC:  Recent Labs Lab 08/15/16 2230 08/16/16 0149 08/16/16 0435 08/16/16 1048 08/17/16 0252  WBC 9.0  8.1 7.7 7.1 10.2  HGB 7.9* 7.5* 7.1* 7.0* 7.7*  HCT 23.9* 22.7* 21.9* 21.9* 23.7*  MCV 99.6 100.0 100.5* 101.4* 100.9*  PLT 169 134* 142* 135* 416   Basic Metabolic Panel:  Recent Labs Lab 08/15/16 2230 08/16/16 0149 08/17/16 0252  NA 134* 137 136  K 3.1* 3.3* 4.2  CL 94* 98* 102  CO2 28 26 21*  GLUCOSE 195* 183* 188*  BUN 26* 27* 41*  CREATININE 5.70* 5.78* 8.27*  CALCIUM 7.8* 7.6* 7.9*  MG  --  2.2  --    GFR: Estimated Creatinine Clearance: 7.2 mL/min (A) (by C-G formula based on SCr of 8.27 mg/dL (H)). Liver Function Tests: No results for input(s): AST, ALT, ALKPHOS, BILITOT, PROT, ALBUMIN in the last 168 hours. No results for input(s): LIPASE, AMYLASE in the last 168 hours. No results for input(s): AMMONIA in the last 168 hours. Coagulation Profile:  Recent Labs Lab 08/16/16 0149  INR 1.27   Cardiac Enzymes:  Recent Labs Lab 08/15/16 2230  CKTOTAL 79   BNP (last 3 results) No results for input(s): PROBNP in the last 8760 hours. HbA1C: No results for input(s): HGBA1C in the last 72 hours. CBG:  Recent Labs Lab 08/16/16 0154 08/16/16 1131 08/16/16 1651 08/16/16 2131  GLUCAP 168* 143* 134* 273*   Lipid Profile: No results for input(s): CHOL, HDL, LDLCALC, TRIG, CHOLHDL, LDLDIRECT in the last 72 hours. Thyroid Function Tests: No results for input(s): TSH, T4TOTAL, FREET4, T3FREE, THYROIDAB in the last 72 hours. Anemia Panel: No results for input(s): VITAMINB12, FOLATE, FERRITIN, TIBC, IRON, RETICCTPCT in the last 72 hours. Sepsis Labs:  Recent Labs Lab 08/15/16 2244  LATICACIDVEN 1.72    Recent Results (from the past 240 hour(s))  Blood culture (routine x 2)     Status: None (Preliminary result)   Collection Time: 08/15/16 10:46 PM  Result Value Ref Range Status   Specimen Description BLOOD RIGHT FOREARM  Final   Special Requests   Final    BOTTLES DRAWN AEROBIC AND ANAEROBIC Blood Culture adequate volume   Culture NO GROWTH < 24 HOURS   Final   Report Status PENDING  Incomplete  Blood culture (routine x 2)     Status: None (Preliminary result)   Collection Time: 08/15/16 10:49 PM  Result Value Ref Range Status   Specimen Description BLOOD RIGHT ARM  Final   Special Requests   Final    BOTTLES DRAWN AEROBIC AND ANAEROBIC Blood Culture adequate volume   Culture NO GROWTH < 24 HOURS  Final   Report Status PENDING  Incomplete  MRSA PCR Screening     Status: None   Collection Time: 08/16/16  4:24 AM  Result Value Ref Range Status   MRSA by PCR NEGATIVE NEGATIVE Final    Comment:        The GeneXpert MRSA Assay (FDA approved for NASAL specimens only), is one component of a comprehensive MRSA colonization surveillance program. It is not intended to diagnose MRSA infection nor to guide or monitor treatment for MRSA infections.  Radiology Studies: Dg Chest 2 View  Result Date: 08/16/2016 CLINICAL DATA:  Patient fell and found to be hypotensive. EXAM: CHEST  2 VIEW COMPARISON:  CXR 05/08/2016 FINDINGS: The heart size and mediastinal contours are within normal limits. Right IJ dialysis catheter tip in the proximal right atrium. Both lungs are clear. The visualized skeletal structures are unremarkable. IMPRESSION: No active cardiopulmonary disease. Dialysis catheter tip in right atrium. No acute osseous appearing abnormality of the bony thorax. Electronically Signed   By: Ashley Royalty M.D.   On: 08/16/2016 00:00   Dg Femur Min 2 Views Left  Result Date: 08/15/2016 CLINICAL DATA:  Patient fell from a walker complaining of left knee pain EXAM: LEFT FEMUR 2 VIEWS COMPARISON:  None. FINDINGS: There is an acute, closed, comminuted impacted supracondylar fracture of the left femur with 1/4 shaft with dorsal displacement of the distal fracture fragments. There is chondrocalcinosis with femorotibial joint space narrowing. Below-knee amputation with intact surgical margins. Extensive calcific arteriosclerosis of the visualized  femoral through tibial arteries. IMPRESSION: Acute, closed, comminuted and impacted supracondylar fracture of the left femur with 1/4 shaft width dorsal displacement of the distal fracture fragments. Status post BKA. Electronically Signed   By: Ashley Royalty M.D.   On: 08/15/2016 23:59        Scheduled Meds: . sodium chloride   Intravenous Once  . Darbepoetin Alfa      . darbepoetin (ARANESP) injection - DIALYSIS  200 mcg Intravenous Q Sat-HD  . dolutegravir  50 mg Oral Daily  . doxercalciferol  2 mcg Intravenous Q T,Th,Sa-HD  . feeding supplement (NEPRO CARB STEADY)  237 mL Oral BID BM  . insulin aspart  0-5 Units Subcutaneous QHS  . insulin aspart  0-9 Units Subcutaneous TID WC  . lamiVUDine  25 mg Oral Daily  . midodrine      . midodrine  10 mg Oral TID WC  . multivitamin  1 tablet Oral QHS  . pantoprazole  40 mg Oral Daily  . povidone-iodine  2 application Topical Once  . sodium chloride flush  3 mL Intravenous Q12H  . sulfamethoxazole-trimethoprim  1 tablet Oral Daily  . [START ON 08/18/2016] tenofovir  300 mg Oral Q Sun   Continuous Infusions:   LOS: 1 day    Alvin Daniels [name], MD [facility] Pager [phone]  If 7PM-7AM, please contact night-coverage [email or web address] Password [facility] 08/17/2016, 10:25 AM

## 2016-08-17 NOTE — Interval H&P Note (Signed)
History and Physical Interval Note:  08/17/2016 3:25 PM  Alvin Daniels  has presented today for surgery, with the diagnosis of femoral fx  The various methods of treatment have been discussed with the patient and family. After consideration of risks, benefits and other options for treatment, the patient has consented to  Procedure(s): INTRAMEDULLARY (IM) RETROGRADE FEMORAL NAILING (Left) as a surgical intervention .  The patient's history has been reviewed, patient examined, no change in status, stable for surgery.  I have reviewed the patient's chart and labs.  Questions were answered to the patient's satisfaction.     Marybelle Killings

## 2016-08-17 NOTE — Anesthesia Procedure Notes (Signed)
Procedure Name: Intubation Date/Time: 08/17/2016 4:06 PM Performed by: Clearnce Sorrel Pre-anesthesia Checklist: Patient identified, Emergency Drugs available, Suction available, Patient being monitored and Timeout performed Patient Re-evaluated:Patient Re-evaluated prior to inductionOxygen Delivery Method: Circle system utilized Preoxygenation: Pre-oxygenation with 100% oxygen Intubation Type: IV induction Ventilation: Mask ventilation without difficulty Laryngoscope Size: Mac and 3 Grade View: Grade II Tube type: Oral Tube size: 7.0 mm Number of attempts: 1 Airway Equipment and Method: Stylet Placement Confirmation: ETT inserted through vocal cords under direct vision,  positive ETCO2 and breath sounds checked- equal and bilateral Secured at: 22 cm Tube secured with: Tape Dental Injury: Teeth and Oropharynx as per pre-operative assessment

## 2016-08-17 NOTE — H&P (View-Only) (Signed)
Patient ID: Alvin Daniels, male   DOB: [birth year], 46 y.o.   MRN: [id number]  surgery later today for femur fracture stabilization. He has anemia with drop in Hgb. Surgery done without a tournaquet. Could get 2 units at dialysis if OK with Nephrologist which would help .  My cell [phone].   Discussed surgery again with patient.

## 2016-08-17 NOTE — Interval H&P Note (Signed)
History and Physical Interval Note:  08/17/2016 3:24 PM  Alvin Daniels  has presented today for surgery, with the diagnosis of femoral fx  The various methods of treatment have been discussed with the patient and family. After consideration of risks, benefits and other options for treatment, the patient has consented to  Procedure(s): INTRAMEDULLARY (IM) RETROGRADE FEMORAL NAILING (Left) as a surgical intervention .  The patient's history has been reviewed, patient examined, no change in status, stable for surgery.  I have reviewed the patient's chart and labs.  Questions were answered to the patient's satisfaction.     Marybelle Killings

## 2016-08-17 NOTE — Progress Notes (Signed)
[facility] Progress Note   Subjective: on HD this am, no c/o  Vitals:   08/17/16 1115 08/17/16 1148 08/17/16 1151 08/17/16 1240  BP: 114/69 108/64 126/67 (!) 111/59  Pulse: 84 88 89 92  Resp: 13  13 18   Temp:   98.6 F (37 C) 98.7 F (37.1 C)  TempSrc:   Oral Oral  SpO2:   100% 99%  Weight:   45.4 kg (100 lb 1.4 oz)   Height:        Inpatient medications: . [MAR Hold] sodium chloride   Intravenous Once  . [MAR Hold] darbepoetin (ARANESP) injection - DIALYSIS  200 mcg Intravenous Q Sat-HD  . [MAR Hold] dolutegravir  50 mg Oral Daily  . [MAR Hold] doxercalciferol  2 mcg Intravenous Q T,Th,Sa-HD  . [MAR Hold] feeding supplement (NEPRO CARB STEADY)  237 mL Oral BID BM  . [MAR Hold] insulin aspart  0-5 Units Subcutaneous QHS  . [MAR Hold] insulin aspart  0-9 Units Subcutaneous TID WC  . [MAR Hold] lamiVUDine  25 mg Oral Daily  . midodrine      . [MAR Hold] midodrine  10 mg Oral TID WC  . [MAR Hold] multivitamin  1 tablet Oral QHS  . [MAR Hold] pantoprazole  40 mg Oral Daily  . povidone-iodine  2 application Topical Once  . [MAR Hold] sodium chloride flush  3 mL Intravenous Q12H  . [MAR Hold] sulfamethoxazole-trimethoprim  1 tablet Oral Daily  . [MAR Hold] tenofovir  300 mg Oral Q Sun   . sodium chloride     [MAR Hold] acetaminophen **OR** [MAR Hold] acetaminophen, [MAR Hold] methocarbamol, [MAR Hold]  morphine injection, [MAR Hold] ondansetron **OR** [MAR Hold] ondansetron (ZOFRAN) IV, [MAR Hold] oxyCODONE-acetaminophen, [MAR Hold] zolpidem  Exam: Thin hispanic male, no distress No jvd Chst clear bilat RRR no mrg Abd soft ntnd  L BKA, leg in ace wrap RLE no edema Ox 3 R IJ  TDC/ L arm AVF +bruit  Dialysis: [facility]ThS at [facility] 4h  2/2 bath  48kg  Hep 2000  IV cath/ L AVF started cannulating this week - Mircera 75 mcg IV q weekly (71mcg dosed 3/24)  - Hectoral 56mcg IV q HD OP Labs: Hgb 11.7 Tsat 36% Corr Ca 8.5 P 4.0 PTH 538      Assessment: 1.  L  femur fracture - to OR today after HD/ transfusion 2.  ESRD HD TTS   3.  Hypotension/volume  - chronic hypotension on midodrine, below dry wt  4.  Anemia  - max esa with HD, transfusing today on HD 2u 5.  Metabolic bone disease -  Cont VDRA/ No binders currently - follow renal panel  6.  Nutrition - Renal diet/ vitamins/ protein supp 7. DM - per primary - says now off insulin 8. HIV - per primary on ART  Plan - as above   Kelly Splinter MD [facility] pager [phone]   08/17/2016, 8:05 AM    Recent Labs Lab 08/15/16 2230 08/16/16 0149 08/17/16 0252 08/17/16 1449  NA 134* 137 136 137  K 3.1* 3.3* 4.2 3.1*  CL 94* 98* 102  --   CO2 28 26 21*  --   GLUCOSE 195* 183* 188* 147*  BUN 26* 27* 41*  --   CREATININE 5.70* 5.78* 8.27*  --   CALCIUM 7.8* 7.6* 7.9*  --    No results for input(s): AST, ALT, ALKPHOS, BILITOT, PROT, ALBUMIN in the last 168 hours.  Recent  Labs Lab 08/16/16 0435 08/16/16 1048 08/17/16 0252 08/17/16 1449  WBC 7.7 7.1 10.2  --   HGB 7.1* 7.0* 7.7* 9.9*  HCT 21.9* 21.9* 23.7* 29.0*  MCV 100.5* 101.4* 100.9*  --   PLT 142* 135* 198  --    Iron/TIBC/Ferritin/ %Sat    Component Value Date/Time   IRON 43 (L) 05/08/2016 1400   TIBC 186 (L) 05/08/2016 1400   FERRITIN 631 (H) 05/08/2016 1400   IRONPCTSAT 23 05/08/2016 1400

## 2016-08-17 NOTE — Brief Op Note (Signed)
08/15/2016 - 08/17/2016  5:14 PM  PATIENT:  Alvin Daniels  46 y.o. male  PRE-OPERATIVE DIAGNOSIS:  femoral fx left closed , displaced supracondylar femur fx without intercondylar extension  POST-OPERATIVE DIAGNOSIS:  femoral fx  PROCEDURE:  Procedure(s): INTRAMEDULLARY (IM) RETROGRADE FEMORAL NAILING (Left)  SURGEON:  Surgeon(s) and Role:    * Marybelle Killings, MD - Primary  PHYSICIAN ASSISTANT:   ASSISTANTS:     ANESTHESIA:   local and general  EBL:  Total I/O In: 500 [I.V.:500] Out: -201   BLOOD ADMINISTERED:none  DRAINS: none   LOCAL MEDICATIONS USED:  MARCAINE     SPECIMEN:  No Specimen  DISPOSITION OF SPECIMEN:  N/A  COUNTS:  YES  TOURNIQUET:  * No tourniquets in log *  DICTATION: .Dragon Dictation  PLAN OF CARE:  already inpatient  PATIENT DISPOSITION:  PACU - hemodynamically stable.   Delay start of Pharmacological VTE agent (>24hrs) due to surgical blood loss or risk of bleeding: yes

## 2016-08-17 NOTE — Anesthesia Postprocedure Evaluation (Addendum)
Anesthesia Post Note  Patient: Alvin Daniels  Procedure(s) Performed: Procedure(s) (LRB): INTRAMEDULLARY (IM) RETROGRADE FEMORAL NAILING (Left)  Patient location during evaluation: PACU Anesthesia Type: General Level of consciousness: awake, awake and alert and oriented Pain management: pain level controlled Vital Signs Assessment: post-procedure vital signs reviewed and stable Respiratory status: spontaneous breathing, nonlabored ventilation and respiratory function stable Cardiovascular status: blood pressure returned to baseline Anesthetic complications: no       Last Vitals:  Vitals:   08/17/16 2000 08/17/16 2015  BP:  97/67  Pulse:  84  Resp: 16 12  Temp:  36.7 C    Last Pain:  Vitals:   08/17/16 2015  TempSrc: Oral  PainSc:                  Garion Wempe COKER

## 2016-08-18 LAB — CBC
HCT: 26.6 % — ABNORMAL LOW (ref 39.0–52.0)
Hemoglobin: 8.8 g/dL — ABNORMAL LOW (ref 13.0–17.0)
MCH: 30.9 pg (ref 26.0–34.0)
MCHC: 33.1 g/dL (ref 30.0–36.0)
MCV: 93.7 fL (ref 78.0–100.0)
PLATELETS: 148 10*3/uL — AB (ref 150–400)
RBC: 2.85 MIL/uL — AB (ref 4.22–5.81)
RDW: 18.6 % — AB (ref 11.5–15.5)
WBC: 6 10*3/uL (ref 4.0–10.5)

## 2016-08-18 LAB — BPAM RBC
BLOOD PRODUCT EXPIRATION DATE: 201804152359
Blood Product Expiration Date: 201804302359
ISSUE DATE / TIME: 201804070920
ISSUE DATE / TIME: 201804070920
UNIT TYPE AND RH: 5100
Unit Type and Rh: 5100

## 2016-08-18 LAB — BASIC METABOLIC PANEL
Anion gap: 11 (ref 5–15)
BUN: 15 mg/dL (ref 6–20)
CALCIUM: 7.5 mg/dL — AB (ref 8.9–10.3)
CO2: 26 mmol/L (ref 22–32)
Chloride: 98 mmol/L — ABNORMAL LOW (ref 101–111)
Creatinine, Ser: 4.27 mg/dL — ABNORMAL HIGH (ref 0.61–1.24)
GFR calc Af Amer: 18 mL/min — ABNORMAL LOW (ref >60)
GFR, EST NON AFRICAN AMERICAN: 15 mL/min — AB (ref >60)
Glucose, Bld: 151 mg/dL — ABNORMAL HIGH (ref 65–99)
POTASSIUM: 3.3 mmol/L — AB (ref 3.5–5.1)
SODIUM: 135 mmol/L (ref 135–145)

## 2016-08-18 LAB — TYPE AND SCREEN
ABO/RH(D): O POS
Antibody Screen: NEGATIVE
UNIT DIVISION: 0
Unit division: 0

## 2016-08-18 LAB — GLUCOSE, CAPILLARY
Glucose-Capillary: 138 mg/dL — ABNORMAL HIGH (ref 65–99)
Glucose-Capillary: 178 mg/dL — ABNORMAL HIGH (ref 65–99)
Glucose-Capillary: 213 mg/dL — ABNORMAL HIGH (ref 65–99)
Glucose-Capillary: 227 mg/dL — ABNORMAL HIGH (ref 65–99)

## 2016-08-18 NOTE — Progress Notes (Signed)
Patient ID: Alvin Daniels, male   DOB: 01/03/1971, 46 y.o.   MRN: [id number]                                                                PROGRESS NOTE                                                                                                                                                                                                             Patient Demographics:    Alvin Daniels, is a 46 y.o. male, DOB - June 05, 1970, JYN:[id number]  Admit date - 08/15/2016   Admitting Physician Ivor Costa, MD  Outpatient Primary MD for the patient is [name], MD  LOS - 2  Outpatient Specialists:   Chief Complaint  Patient presents with  . Leg Pain  . Hypotension       Brief Narrative   46 y.o. male with medical history significant of diabetes mellitus, GERD, pancreatitis, HCV, HIV (350 on 07/24/2016), ESRD-HD (TTS), chronic diarrhea, s/p of L BKA, chronic hypotension (baseline SBP at ~80s), whopresents with left leg pain after fall.  The patient states that 4 days ago he fell when was using his walker in his house and slipped.  He struck his "back" and the anterior aspect of the L knee. He is currently reporting constant pain at the lateral side of left distal thigh. He denies any head or neck trauma. No headache or neck pain. He is hypotensive in triag. He reports chronic hypotension with SBP at ~80 mmHg. He is alert and oriented x3. No confusion. Patient denies chest pain, cough, shortness rest. No nausea, vomiting, diarrhea or abdominal pain. He has chills, no fever. No symptoms of UTI or unilateral weakness. He stopped his diabetes regimen several months ago on physician instruction. He states that he attended dialysis today and had full treatment.  ED Course: pt was found to have hemoglobin dropped from 11.7-->7.9-->7.5, lactate normal, potassium 3.1, bicarbonate 28, creatinine 5.7, BUN 26, temperature 99.3, O2 saturation 100% on room air, negative chest x-ray. X-ray  of left femur showed acute, closed, comminuted and impacted supracondylar fracture of the left femur with 1/4 shaft width dorsal displacement of the distal fracture fragments. Pt is admitted to SDU as inpt (due to hypotension, pt was refused from tele floor). Ortho,  Dr. Heinz Knuckles was consulted.  Subjective:    Alvin Daniels today is doing well.  POD#1 bp soft, but pt asymptomatic No headache, No chest pain, No abdominal pain - No Nausea, No new weakness tingling or numbness, No Cough - SOB.    Assessment  & Plan :    Principal Problem:   Femur fracture, left ([name]) Active Problems:   Human immunodeficiency virus (HIV) disease ([name])   Diabetes mellitus with ESRD (end-stage renal disease) ([place])   Anemia of chronic renal failure   ESRD on dialysis ([name])   GERD (gastroesophageal reflux disease)   Hypokalemia   HIV (human immunodeficiency virus infection) ([place])   # Left  Supracondylar femur fracture after the fall: Retrograde the locking femoral nail 300 x 9 mm Biomet. 4/7 -Orthopedic consult appreciated.   #Chronic hypotension and in ESRD patient: Low blood pressure likely contributed by vasculopathy. Patient is clinically asymptomatic. Reported that his baseline blood pressure is systolic 16X. Continue midodrine. Continue to monitor blood pressure. No IV fluid because of ESRD.  #ESRD on hemodialysis: Patient is Tuesday Thursday Saturday schedule. Nephrology consult appreciated.Marland Kitchen  #Anemia of chronic kidney disease: No sign of active bleeding. Monitor CBC. IV iron and ESA during dialysis defer to nephrologist -Hb 7.7 Transfusing 2 units of PRC 4/7 Hgb improved Check cbc in am  #Hypokalemia in ESRD patient:  Resolved Check cmp in am  #HIV disease/ Hep C: Continue antiviral medication. Recommended to follow-up with ID outpatient. cont viread, tivacay, epivir  #Chronic thrombocytopenia: Monitor platelet counts.  # Dm2 fsbs , iss   DVT prophylaxis: on lovenox Code  Status: Full code Family Communication: No family present at bedside Disposition Plan: Current inpatient      Lab Results  Component Value Date   PLT 148 (L) 08/18/2016    Antibiotics  :  Bactrim 4/6=>, Vanco 4/6  Anti-infectives    Start     Dose/Rate Route Frequency Ordered Stop   08/18/16 1000  tenofovir (VIREAD) tablet 300 mg     300 mg Oral Every Sun 08/16/16 1318     08/17/16 2030  lamiVUDine (EPIVIR) 10 MG/ML solution 25 mg  Status:  Discontinued     25 mg Oral Daily 08/17/16 2019 08/17/16 2022   08/16/16 1800  lamiVUDine (EPIVIR) 10 MG/ML solution 25 mg     25 mg Oral Daily 08/16/16 1318     08/16/16 1000  dolutegravir (TIVICAY) tablet 50 mg     50 mg Oral Daily 08/16/16 0132     08/16/16 1000  sulfamethoxazole-trimethoprim (BACTRIM,SEPTRA) 400-80 MG per tablet 1 tablet     1 tablet Oral Daily 08/16/16 0132     08/16/16 0030  vancomycin (VANCOCIN) 1,500 mg in sodium chloride 0.9 % 500 mL IVPB     1,500 mg 250 mL/hr over 120 Minutes Intravenous  Once 08/16/16 0027 08/16/16 0315        Objective:   Vitals:   08/18/16 0357 08/18/16 0400 08/18/16 0500 08/18/16 0800  BP: 93/60   (!) 75/46  Pulse: 91 88 85 85  Resp: 12 19 (!) 0 16  Temp: 99.3 F (37.4 C)   98.6 F (37 C)  TempSrc: Oral   Oral  SpO2: 100% 100% 100% 100%  Weight:   46.3 kg (102 lb 1.2 oz)   Height:        Wt Readings from Last 3 Encounters:  08/18/16 46.3 kg (102 lb 1.2 oz)  08/14/16 62.6 kg (138 lb)  05/25/16 44.3 kg (97  lb 10.6 oz)     Intake/Output Summary (Last 24 hours) at 08/18/16 0938 Last data filed at 08/18/16 0000  Gross per 24 hour  Intake              850 ml  Output             -151 ml  Net             1001 ml     Physical Exam  Awake Alert, Oriented X 3, No new F.N deficits, Normal affect [place].AT,PERRAL Supple Neck,No JVD, No cervical lymphadenopathy appriciated.  Symmetrical Chest wall movement, Good air movement bilaterally, CTAB RRR,No Gallops,Rubs or new Murmurs,  No Parasternal Heave +ve B.Sounds, Abd Soft, No tenderness, No organomegaly appriciated, No rebound - guarding or rigidity. No Cyanosis, Clubbing or edema, No new Rash or bruise   L BKA     Data Review:    CBC  Recent Labs Lab 08/16/16 0149 08/16/16 0435 08/16/16 1048 08/17/16 0252 08/17/16 1449 08/18/16 0204  WBC 8.1 7.7 7.1 10.2  --  6.0  HGB 7.5* 7.1* 7.0* 7.7* 9.9* 8.8*  HCT 22.7* 21.9* 21.9* 23.7* 29.0* 26.6*  PLT 134* 142* 135* 198  --  148*  MCV 100.0 100.5* 101.4* 100.9*  --  93.7  MCH 33.0 32.6 32.4 32.8  --  30.9  MCHC 33.0 32.4 32.0 32.5  --  33.1  RDW 14.1 14.3 14.2 14.4  --  18.6*    Chemistries   Recent Labs Lab 08/15/16 2230 08/16/16 0149 08/17/16 0252 08/17/16 1449 08/18/16 0204  NA 134* 137 136 137 135  K 3.1* 3.3* 4.2 3.1* 3.3*  CL 94* 98* 102  --  98*  CO2 28 26 21*  --  26  GLUCOSE 195* 183* 188* 147* 151*  BUN 26* 27* 41*  --  15  CREATININE 5.70* 5.78* 8.27*  --  4.27*  CALCIUM 7.8* 7.6* 7.9*  --  7.5*  MG  --  2.2  --   --   --    ------------------------------------------------------------------------------------------------------------------ No results for input(s): CHOL, HDL, LDLCALC, TRIG, CHOLHDL, LDLDIRECT in the last 72 hours.  Lab Results  Component Value Date   HGBA1C 5.0 05/24/2016   ------------------------------------------------------------------------------------------------------------------ No results for input(s): TSH, T4TOTAL, T3FREE, THYROIDAB in the last 72 hours.  Invalid input(s): FREET3 ------------------------------------------------------------------------------------------------------------------ No results for input(s): VITAMINB12, FOLATE, FERRITIN, TIBC, IRON, RETICCTPCT in the last 72 hours.  Coagulation profile  Recent Labs Lab 08/16/16 0149  INR 1.27    No results for input(s): DDIMER in the last 72 hours.  Cardiac Enzymes No results for input(s): CKMB, TROPONINI, MYOGLOBIN in the last 168  hours.  Invalid input(s): CK ------------------------------------------------------------------------------------------------------------------ No results found for: BNP  Inpatient Medications  Scheduled Meds: . sodium chloride   Intravenous Once  . darbepoetin (ARANESP) injection - DIALYSIS  200 mcg Intravenous Q Sat-HD  . dolutegravir  50 mg Oral Daily  . doxercalciferol  2 mcg Intravenous Q T,Th,Sa-HD  . enoxaparin (LOVENOX) injection  30 mg Subcutaneous Q24H  . feeding supplement (NEPRO CARB STEADY)  237 mL Oral BID BM  . glipiZIDE  5 mg Oral QAC breakfast  . insulin aspart  0-5 Units Subcutaneous QHS  . insulin aspart  0-9 Units Subcutaneous TID WC  . lamiVUDine  25 mg Oral Daily  . midodrine  10 mg Oral TID WC  . multivitamin  1 tablet Oral QHS  . pantoprazole  40 mg Oral Daily  . sodium chloride flush  3 mL Intravenous Q12H  . sulfamethoxazole-trimethoprim  1 tablet Oral Daily  . tenofovir  300 mg Oral Q Sun   Continuous Infusions: PRN Meds:.acetaminophen **OR** acetaminophen, menthol-cetylpyridinium **OR** phenol, methocarbamol, metoCLOPramide **OR** metoCLOPramide (REGLAN) injection, morphine injection, morphine injection, ondansetron **OR** ondansetron (ZOFRAN) IV, oxyCODONE, oxyCODONE-acetaminophen, zolpidem  Micro Results Recent Results (from the past 240 hour(s))  Blood culture (routine x 2)     Status: None (Preliminary result)   Collection Time: 08/15/16 10:46 PM  Result Value Ref Range Status   Specimen Description BLOOD RIGHT FOREARM  Final   Special Requests   Final    BOTTLES DRAWN AEROBIC AND ANAEROBIC Blood Culture adequate volume   Culture NO GROWTH 2 DAYS  Final   Report Status PENDING  Incomplete  Blood culture (routine x 2)     Status: None (Preliminary result)   Collection Time: 08/15/16 10:49 PM  Result Value Ref Range Status   Specimen Description BLOOD RIGHT ARM  Final   Special Requests   Final    BOTTLES DRAWN AEROBIC AND ANAEROBIC Blood  Culture adequate volume   Culture NO GROWTH 2 DAYS  Final   Report Status PENDING  Incomplete  MRSA PCR Screening     Status: None   Collection Time: 08/16/16  4:24 AM  Result Value Ref Range Status   MRSA by PCR NEGATIVE NEGATIVE Final    Comment:        The GeneXpert MRSA Assay (FDA approved for NASAL specimens only), is one component of a comprehensive MRSA colonization surveillance program. It is not intended to diagnose MRSA infection nor to guide or monitor treatment for MRSA infections.     Radiology Reports Dg Chest 2 View  Result Date: 08/16/2016 CLINICAL DATA:  Patient fell and found to be hypotensive. EXAM: CHEST  2 VIEW COMPARISON:  CXR 05/08/2016 FINDINGS: The heart size and mediastinal contours are within normal limits. Right IJ dialysis catheter tip in the proximal right atrium. Both lungs are clear. The visualized skeletal structures are unremarkable. IMPRESSION: No active cardiopulmonary disease. Dialysis catheter tip in right atrium. No acute osseous appearing abnormality of the bony thorax. Electronically Signed   By: Ashley Royalty M.D.   On: 08/16/2016 00:00   Dg C-arm 1-60 Min  Result Date: 08/17/2016 CLINICAL DATA:  Internal fixation of left femoral fracture. Initial encounter. EXAM: DG C-ARM 61-120 MIN; LEFT FEMUR 2 VIEWS COMPARISON:  Left femur radiographs performed 08/15/2016 FINDINGS: Four fluoroscopic C-arm images are provided from the OR, demonstrating placement of an intramedullary nail and screws across the patient's left femoral fracture. Underlying impaction is slightly more conspicuous than on the prior study, with underlying butterfly fragments again seen. The femoral condyles are slightly posteriorly rotated. No new fractures are identified. IMPRESSION: Status post internal fixation of distal femoral fracture. Underlying impaction is slightly more conspicuous than on the prior study, with butterfly fragments again seen. Femoral condyles slightly posteriorly  rotated. No new fracture seen. Electronically Signed   By: Garald Balding M.D.   On: 08/17/2016 17:50   Dg Femur Min 2 Views Left  Result Date: 08/17/2016 CLINICAL DATA:  Internal fixation of left femoral fracture. Initial encounter. EXAM: DG C-ARM 61-120 MIN; LEFT FEMUR 2 VIEWS COMPARISON:  Left femur radiographs performed 08/15/2016 FINDINGS: Four fluoroscopic C-arm images are provided from the OR, demonstrating placement of an intramedullary nail and screws across the patient's left femoral fracture. Underlying impaction is slightly more conspicuous than on the prior study, with underlying butterfly fragments again seen.  The femoral condyles are slightly posteriorly rotated. No new fractures are identified. IMPRESSION: Status post internal fixation of distal femoral fracture. Underlying impaction is slightly more conspicuous than on the prior study, with butterfly fragments again seen. Femoral condyles slightly posteriorly rotated. No new fracture seen. Electronically Signed   By: Garald Balding M.D.   On: 08/17/2016 17:50   Dg Femur Min 2 Views Left  Result Date: 08/15/2016 CLINICAL DATA:  Patient fell from a walker complaining of left knee pain EXAM: LEFT FEMUR 2 VIEWS COMPARISON:  None. FINDINGS: There is an acute, closed, comminuted impacted supracondylar fracture of the left femur with 1/4 shaft with dorsal displacement of the distal fracture fragments. There is chondrocalcinosis with femorotibial joint space narrowing. Below-knee amputation with intact surgical margins. Extensive calcific arteriosclerosis of the visualized femoral through tibial arteries. IMPRESSION: Acute, closed, comminuted and impacted supracondylar fracture of the left femur with 1/4 shaft width dorsal displacement of the distal fracture fragments. Status post BKA. Electronically Signed   By: Ashley Royalty M.D.   On: 08/15/2016 23:59    Time Spent in minutes  30   Jani Gravel M.D on 08/18/2016 at 9:38 AM  Between 7am to 7pm -  Pager - [phone]  After 7pm go to [email or web address] - password [facility]  [facility] -  Office  [phone]

## 2016-08-18 NOTE — Progress Notes (Signed)
Discussed immunization with patient and his visitor. They both speak Spanish. Asked about a PNA vaccine and patient stated he did receive one about 3 months ago. When asked where he received vaccine he could not tell me. Visitor shruggged shoulders. Not clear about a vaccine for PNA.

## 2016-08-18 NOTE — Progress Notes (Signed)
[facility] Progress Note   Subjective: no c/o's  Vitals:   08/18/16 0500 08/18/16 0800 08/18/16 1156 08/18/16 1553  BP:  (!) 75/46 (!) 93/58 107/75  Pulse: 85 85 92 82  Resp: (!) 0 16 15 14   Temp:  98.6 F (37 C) 98.4 F (36.9 C) 98.4 F (36.9 C)  TempSrc:  Oral Oral Oral  SpO2: 100% 100% 99% 100%  Weight: 46.3 kg (102 lb 1.2 oz)     Height:        Inpatient medications: . sodium chloride   Intravenous Once  . darbepoetin (ARANESP) injection - DIALYSIS  200 mcg Intravenous Q Sat-HD  . dolutegravir  50 mg Oral Daily  . doxercalciferol  2 mcg Intravenous Q T,Th,Sa-HD  . enoxaparin (LOVENOX) injection  30 mg Subcutaneous Q24H  . feeding supplement (NEPRO CARB STEADY)  237 mL Oral BID BM  . glipiZIDE  5 mg Oral QAC breakfast  . insulin aspart  0-5 Units Subcutaneous QHS  . insulin aspart  0-9 Units Subcutaneous TID WC  . lamiVUDine  25 mg Oral Daily  . midodrine  10 mg Oral TID WC  . multivitamin  1 tablet Oral QHS  . pantoprazole  40 mg Oral Daily  . sodium chloride flush  3 mL Intravenous Q12H  . sulfamethoxazole-trimethoprim  1 tablet Oral Daily  . tenofovir  300 mg Oral Q Sun    acetaminophen **OR** acetaminophen, menthol-cetylpyridinium **OR** phenol, methocarbamol, metoCLOPramide **OR** metoCLOPramide (REGLAN) injection, morphine injection, morphine injection, ondansetron **OR** ondansetron (ZOFRAN) IV, oxyCODONE, oxyCODONE-acetaminophen, zolpidem  Exam: Thin hispanic male, no distress No jvd Chst clear bilat RRR no mrg Abd soft ntnd  L BKA, leg in ace wrap RLE no edema Ox 3 R IJ  TDC/ L arm AVF +bruit  Dialysis: TTS NW 4h  2/2 bath  48kg  Hep 2000  IV cath/ L AVF started cannulating this week - Mircera 75 mcg IV q weekly (68mcg dosed 3/24)  - Hectoral 75mcg IV q HD - OP Labs: Hgb 11.7 Tsat 36% Corr Ca 8.5 P 4.0 PTH 538      Assessment: 1.  L femur fracture - sp ORIF on 4/7, stable post-op 2.  ESRD HD TTS   3.  Hypotension/volume  -  chronic hypotension on midodrine, below dry  4.  Anemia  - max esa with HD, sp 2u prbc 4/7 preop, Hb up from 7's > 8.8 5.  Metabolic bone disease -  Cont VDRA/ No binders currently 6.  Nutrition - Renal diet/ vitamins/ protein supp 7. DM - per primary - says now off insulin 8. HIV - per primary on ART  Plan - cont HD TTS schedule, max esa.     Kelly Splinter MD [employer] pager [phone]   08/18/2016, 8:05 AM    Recent Labs Lab 08/16/16 0149 08/17/16 0252 08/17/16 1449 08/18/16 0204  NA 137 136 137 135  K 3.3* 4.2 3.1* 3.3*  CL 98* 102  --  98*  CO2 26 21*  --  26  GLUCOSE 183* 188* 147* 151*  BUN 27* 41*  --  15  CREATININE 5.78* 8.27*  --  4.27*  CALCIUM 7.6* 7.9*  --  7.5*   No results for input(s): AST, ALT, ALKPHOS, BILITOT, PROT, ALBUMIN in the last 168 hours.  Recent Labs Lab 08/16/16 1048 08/17/16 0252 08/17/16 1449 08/18/16 0204  WBC 7.1 10.2  --  6.0  HGB 7.0* 7.7* 9.9* 8.8*  HCT 21.9* 23.7* 29.0*  26.6*  MCV 101.4* 100.9*  --  93.7  PLT 135* 198  --  148*   Iron/TIBC/Ferritin/ %Sat    Component Value Date/Time   IRON 43 (L) 05/08/2016 1400   TIBC 186 (L) 05/08/2016 1400   FERRITIN 631 (H) 05/08/2016 1400   IRONPCTSAT 23 05/08/2016 1400

## 2016-08-18 NOTE — Progress Notes (Signed)
   Subjective:  Patient reports pain as moderate.    Objective:   VITALS:   Vitals:   08/18/16 0357 08/18/16 0400 08/18/16 0500 08/18/16 0800  BP: 93/60   (!) 75/46  Pulse: 91 88 85 85  Resp: 12 19 (!) 0 16  Temp: 99.3 F (37.4 C)   98.6 F (37 C)  TempSrc: Oral   Oral  SpO2: 100% 100% 100% 100%  Weight:   46.3 kg (102 lb 1.2 oz)   Height:        Neurologically intact Incision: dressing C/D/I and no drainage No cellulitis present Compartment soft   Lab Results  Component Value Date   WBC 6.0 08/18/2016   HGB 8.8 (L) 08/18/2016   HCT 26.6 (L) 08/18/2016   MCV 93.7 08/18/2016   PLT 148 (L) 08/18/2016     Assessment/Plan:  1 Day Post-Op   - Expected postop acute blood loss anemia - will monitor for symptoms - Up with PT/OT - DVT ppx - SCDs, ambulation, lovenox - NWB operative extremity - Pain control - Discharge planning  Eduard Roux 08/18/2016, 9:41 AM [phone]

## 2016-08-18 NOTE — Evaluation (Signed)
Physical Therapy Evaluation Patient Details Name: Alvin Daniels MRN: [id number] DOB: Sep 13, 1970 Today's Date: 08/18/2016   History of Present Illness  46 y.o. male with medical history significant of diabetes mellitus, GERD, pancreatitis, HCV, HIV (350 on 07/24/2016), ESRD-HD (TTS), chronic diarrhea, s/p of L BKA, chronic hypotension (baseline SBP at ~80s), who presents with left leg pain after fall, femor fx now s/p IM nailing  Clinical Impression  Patient demonstrates deficits in functional mobility as indicated below. Will need continued skilled PT to address deficits and maximize function. Will see as indicated and progress as tolerated.  Will attempt stair training next session.    Follow Up Recommendations No PT follow up;Supervision for mobility/OOB    Equipment Recommendations  None recommended by PT    Recommendations for Other Services       Precautions / Restrictions Precautions Precautions: Fall Precaution Comments: NWBing residual limb due to IM nailing Restrictions Weight Bearing Restrictions: Yes      Mobility  Bed Mobility Overal bed mobility: Modified Independent             General bed mobility comments: no physical assist required  Transfers Overall transfer level: Needs assistance Equipment used: Rolling walker (2 wheeled) Transfers: Sit to/from [facility] Sit to Stand: Min guard Stand pivot transfers: Min guard       General transfer comment: min guard for safety, no physical assist required  Ambulation/Gait             General Gait Details: did not attempt  Stairs            Wheelchair Mobility    Modified Rankin (Stroke Patients Only)       Balance Overall balance assessment: History of Falls                                           Pertinent Vitals/Pain Pain Assessment: Faces Faces Pain Scale: Hurts even more Pain Location: left LE Pain Descriptors / Indicators:  Sore;Discomfort Pain Intervention(s): Monitored during session    Home Living Family/patient expects to be discharged to:: Private residence Living Arrangements: Other relatives Available Help at Discharge: Friend(s);Available PRN/intermittently Type of Home: Apartment Home Access: Stairs to enter Entrance Stairs-Rails: None Entrance Stairs-Number of Steps: 2 Home Layout: One level Home Equipment: Walker - 2 wheels;Wheelchair - manual Additional Comments: Pt reports he sits on a bucket to shower    Prior Function Level of Independence: Independent with assistive device(s)         Comments: Pt reports one fall ~2 mos ago on the stairs (RW wheel slipped).  pt uses RW to hop and move except WC for HD or community (taken per chart review from previous admission)     Hand Dominance   Dominant Hand: Right    Extremity/Trunk Assessment        Lower Extremity Assessment Lower Extremity Assessment: LLE deficits/detail LLE Deficits / Details: BKA unable to fully assess due to pain       Communication   Communication: Prefers language other than English  Cognition Arousal/Alertness: Awake/alert Behavior During Therapy: WFL for tasks assessed/performed Overall Cognitive Status: Difficult to assess                                        General Comments  Exercises     Assessment/Plan    PT Assessment Patient needs continued PT services  PT Problem List Decreased mobility;Decreased balance;Decreased activity tolerance;Pain       PT Treatment Interventions DME instruction;Gait training;Stair training;Functional mobility training;Therapeutic activities;Therapeutic exercise;Balance training;Patient/family education    PT Goals (Current goals can be found in the Care Plan section)  Acute Rehab PT Goals Patient Stated Goal: none stated PT Goal Formulation: With patient Time For Goal Achievement: 09/01/16 Potential to Achieve Goals: Good     Frequency Min 3X/week   Barriers to discharge Inaccessible home environment      Co-evaluation               End of Session   Activity Tolerance: Patient tolerated treatment well Patient left: in chair;with call bell/phone within reach;with family/visitor present Nurse Communication: Mobility status PT Visit Diagnosis: Repeated falls (R29.6)    Time: 7793-9030 PT Time Calculation (min) (ACUTE ONLY): 16 min   Charges:   PT Evaluation $PT Eval Moderate Complexity: 1 Procedure     PT G Codes:        [name], PT DPT  [phone]   Duncan Dull 08/18/2016, 4:35 PM

## 2016-08-19 DIAGNOSIS — S72452A Displaced supracondylar fracture without intracondylar extension of lower end of left femur, initial encounter for closed fracture: Principal | ICD-10-CM

## 2016-08-19 LAB — GLUCOSE, CAPILLARY
GLUCOSE-CAPILLARY: 161 mg/dL — AB (ref 65–99)
GLUCOSE-CAPILLARY: 188 mg/dL — AB (ref 65–99)
GLUCOSE-CAPILLARY: 205 mg/dL — AB (ref 65–99)
Glucose-Capillary: 118 mg/dL — ABNORMAL HIGH (ref 65–99)

## 2016-08-19 LAB — CBC
HEMATOCRIT: 26.9 % — AB (ref 39.0–52.0)
HEMOGLOBIN: 9 g/dL — AB (ref 13.0–17.0)
MCH: 31.3 pg (ref 26.0–34.0)
MCHC: 33.5 g/dL (ref 30.0–36.0)
MCV: 93.4 fL (ref 78.0–100.0)
Platelets: 162 10*3/uL (ref 150–400)
RBC: 2.88 MIL/uL — ABNORMAL LOW (ref 4.22–5.81)
RDW: 18.4 % — ABNORMAL HIGH (ref 11.5–15.5)
WBC: 5.6 10*3/uL (ref 4.0–10.5)

## 2016-08-19 LAB — BASIC METABOLIC PANEL
ANION GAP: 16 — AB (ref 5–15)
BUN: 29 mg/dL — AB (ref 6–20)
CHLORIDE: 96 mmol/L — AB (ref 101–111)
CO2: 24 mmol/L (ref 22–32)
Calcium: 7.7 mg/dL — ABNORMAL LOW (ref 8.9–10.3)
Creatinine, Ser: 6.49 mg/dL — ABNORMAL HIGH (ref 0.61–1.24)
GFR, EST AFRICAN AMERICAN: 11 mL/min — AB (ref >60)
GFR, EST NON AFRICAN AMERICAN: 9 mL/min — AB (ref >60)
Glucose, Bld: 182 mg/dL — ABNORMAL HIGH (ref 65–99)
POTASSIUM: 3.7 mmol/L (ref 3.5–5.1)
Sodium: 136 mmol/L (ref 135–145)

## 2016-08-19 NOTE — Progress Notes (Signed)
qPhysical Therapy Treatment Patient Details Name: Alvin Daniels MRN: [id number] DOB: 1970/07/03 Today's Date: 08/19/2016    History of Present Illness 46 y.o. male with medical history significant of diabetes mellitus, GERD, pancreatitis, HCV, HIV (350 on 07/24/2016), ESRD-HD (TTS), chronic diarrhea, s/p of L BKA, chronic hypotension (baseline SBP at ~80s), who presents with left leg pain after fall, femor fx now s/p IM nailing    PT Comments    Pt admitted with above diagnosis. Pt currently with functional limitations due to balance and endurance deficits. Pt was able to ambulate with RW with min guard assist.  Pt needs cues to slow down at times.  Pt needs continued PT to work on safety with ambulation.   Pt will benefit from skilled PT to increase their independence and safety with mobility to allow discharge to the venue listed below.     Follow Up Recommendations  No PT follow up;Supervision for mobility/OOB     Equipment Recommendations  None recommended by PT    Recommendations for Other Services       Precautions / Restrictions Precautions Precautions: Fall Precaution Comments: NWBing residual limb due to IM nailing Restrictions Weight Bearing Restrictions: Yes    Mobility  Bed Mobility Overal bed mobility: Independent                Transfers Overall transfer level: Modified independent Equipment used: Rolling walker (2 wheeled) Transfers: Sit to/from [facility] Sit to Stand: Supervision            Ambulation/Gait Ambulation/Gait assistance: Min guard Ambulation Distance (Feet): 120 Feet Assistive device: Rolling walker (2 wheeled) Gait Pattern/deviations: Step-through pattern;Trunk flexed   Gait velocity interpretation: <1.8 ft/sec, indicative of risk for recurrent falls General Gait Details: Pt was able to ambulate with RW with cues to slow down as at times pt goes too fast.    Research officer, trade union    Modified Rankin (Stroke Patients Only)       Balance Overall balance assessment: History of Falls;Needs assistance         Standing balance support: Bilateral upper extremity supported;During functional activity Standing balance-Leahy Scale: Poor Standing balance comment: can stand statically with RW relying on UE support.                            Cognition Arousal/Alertness: Awake/alert Behavior During Therapy: WFL for tasks assessed/performed Overall Cognitive Status: Within Functional Limits for tasks assessed                                        Exercises Amputee Exercises Quad Sets: AROM;Left;Supine;10 reps Hip ABduction/ADduction: AROM;Left;10 reps;Supine Knee Flexion: AROM;Left;10 reps;Supine    General Comments General comments (skin integrity, edema, etc.): ice applied to L LE       Pertinent Vitals/Pain Pain Assessment: Faces Faces Pain Scale: Hurts little more Pain Location: left LE Pain Descriptors / Indicators: Sore;Discomfort Pain Intervention(s): Monitored during session;Premedicated before session;Repositioned;Ice applied    Home Living Family/patient expects to be discharged to:: Private residence Living Arrangements: Other relatives Available Help at Discharge: Friend(s);Available PRN/intermittently Type of Home: Apartment Home Access: Stairs to enter Entrance Stairs-Rails: None Home Layout: One level Home Equipment: Environmental consultant - 2 wheels;Wheelchair - manual Additional Comments: Pt reports he sits on a bucket to shower/  pt transfers with RW to the bathroom hopping    Prior Function Level of Independence: Independent      Comments: Pt reports one fall ~2 mos ago on the stairs (RW wheel slipped).  pt uses RW to hop and move except WC for HD or community   PT Goals (current goals can now be found in the care plan section) Acute Rehab PT Goals Patient Stated Goal: none stated Progress towards PT goals:  Progressing toward goals    Frequency    Min 3X/week      PT Plan Current plan remains appropriate    Co-evaluation             End of Session Equipment Utilized During Treatment: Gait belt Activity Tolerance: Patient tolerated treatment well Patient left: in chair;with call bell/phone within reach;with chair alarm set Nurse Communication: Mobility status PT Visit Diagnosis: Repeated falls (R29.6)     Time: 8828-0034 PT Time Calculation (min) (ACUTE ONLY): 12 min  Charges:  $Gait Training: 8-22 mins                    G Codes:       [place] [phone] (pager)    [name] 08/19/2016, 1:16 PM

## 2016-08-19 NOTE — Progress Notes (Signed)
Patient ID: Alvin Daniels, male   DOB: 1970/11/21, 46 y.o.   MRN: [id number]                                                                PROGRESS NOTE                        Patient Demographics:   Alvin Daniels, is a 46 y.o. male, DOB - [birth year], IWP:[id number]  Admit date - 08/15/2016   Admitting Physician Ivor Costa, MD  Outpatient Primary MD for the patient is [name], MD  LOS - 3  Chief Complaint  Patient presents with  . Leg Pain  . Hypotension      Brief Narrative: 46 y.o. male with medical history significant of diabetes mellitus, GERD, pancreatitis, HCV, HIV (350 on 07/24/2016), ESRD-HD (TTS), chronic diarrhea, s/p of L BKA, chronic hypotension (baseline SBP at ~80s), whopresented with left leg pain after fall. Pt he fell when was using his walker in his house and slipped.  He struck his "back" and the anterior aspect of the L knee.   ED Course: pt was found to have hemoglobin dropped from 11.7-->7.9-->7.5, lactate normal, potassium 3.1, bicarbonate 28, creatinine 5.7, BUN 26, temperature 99.3, O2 saturation 100% on room air, negative chest x-ray. X-ray of left femur showed acute, closed, comminuted and impacted supracondylar fracture of the left femur with 1/4 shaft width dorsal displacement of the distal fracture fragments. Pt admitted to SDU as inpt (due to hypotension, pt was refused from tele floor). Ortho, Dr. Heinz Knuckles was consulted.   Subjective:   POD#2, pt reports no concerns this AM, no chest pain, no abd pain. Hip pain is still intermittent but overall improved.    Assessment  & Plan :   Left Supracondylar femur fracture after the fall - s/p Retrograde the locking femoral nail 300 x 9 mm Biomet. 4/7 - post op day #2 - Up with PT/OT - DVT ppx - SCDs, ambulation, lovenox - NWB operative extremity - analgesia as needed   Chronic hypotension in ESRD patient - Low blood pressure likely contributed by vasculopathy. Patient is clinically  asymptomatic. - continue Midodrine   ESRD  - HD TTS, plan for HD in am  - nephrology team following   Anemia of chronic kidney disease - with component of acute post op blood loss anemia  - no sign of active bleeding - Hb 7.7 Transfused 2 units of PRBC 4/7 - monitor for now  Hypokalemia - WNL this AM  HIV disease/ Hep C - Continue antiviral medication. Recommended to follow-up with ID outpatient. - cont viread, tivacay, epivir  Chronic thrombocytopenia - Plt counts stable this AM  DM type II with complications of nephropathy, PVD - continue SSI and glipizide   DVT prophylaxis: on lovenox SQ Code Status: Full code Family Communication: No family present at bedside Disposition Plan: to be determined, likely home in 1-2 days  Antibiotics  :  Bactrim 4/6=>, Vanco 4/6  Anti-infectives    Start     Dose/Rate Route Frequency Ordered Stop   08/18/16 1000  tenofovir (VIREAD) tablet 300 mg     300 mg Oral Every Sun 08/16/16 1318     08/17/16 2030  lamiVUDine (EPIVIR) 10  MG/ML solution 25 mg  Status:  Discontinued     25 mg Oral Daily 08/17/16 2019 08/17/16 2022   08/16/16 1800  lamiVUDine (EPIVIR) 10 MG/ML solution 25 mg     25 mg Oral Daily 08/16/16 1318     08/16/16 1000  dolutegravir (TIVICAY) tablet 50 mg     50 mg Oral Daily 08/16/16 0132     08/16/16 1000  sulfamethoxazole-trimethoprim (BACTRIM,SEPTRA) 400-80 MG per tablet 1 tablet     1 tablet Oral Daily 08/16/16 0132     08/16/16 0030  vancomycin (VANCOCIN) 1,500 mg in sodium chloride 0.9 % 500 mL IVPB     1,500 mg 250 mL/hr over 120 Minutes Intravenous  Once 08/16/16 0027 08/16/16 0315      Objective:   Vitals:   08/19/16 0000 08/19/16 0336 08/19/16 0400 08/19/16 0838  BP:  (!) 82/58  98/63  Pulse: 80 84 77 94  Resp: (!) 0 14 (!) 0 18  Temp:  99.6 F (37.6 C)  99.8 F (37.7 C)  TempSrc:  Oral  Oral  SpO2: 96% 97% 99% 97%  Weight:  46.3 kg (102 lb)    Height:        Wt Readings from Last 3  Encounters:  08/19/16 46.3 kg (102 lb)  08/14/16 62.6 kg (138 lb)  05/25/16 44.3 kg (97 lb 10.6 oz)    Intake/Output Summary (Last 24 hours) at 08/19/16 0930 Last data filed at 08/18/16 1800  Gross per 24 hour  Intake              230 ml  Output                0 ml  Net              230 ml   Physical Exam Constitutional: Appears well-developed and well-nourished. No distress.  CVS: RRR, S1/S2 +, no murmurs, no gallops, no carotid bruit.  Pulmonary: Effort and breath sounds normal, no stridor, rhonchi, wheezes, rales.  Abdominal: Soft. BS +,  no distension, tenderness, rebound or guarding.  Ext: R IJ  TDC/ L arm AVF +bruit, L BKA Lymphadenopathy: No lymphadenopathy noted, cervical, inguinal. Neuro: Alert. Normal reflexes, muscle tone coordination   Data Review:   CBC  Recent Labs Lab 08/16/16 0435 08/16/16 1048 08/17/16 0252 08/17/16 1449 08/18/16 0204 08/19/16 0219  WBC 7.7 7.1 10.2  --  6.0 5.6  HGB 7.1* 7.0* 7.7* 9.9* 8.8* 9.0*  HCT 21.9* 21.9* 23.7* 29.0* 26.6* 26.9*  PLT 142* 135* 198  --  148* 162  MCV 100.5* 101.4* 100.9*  --  93.7 93.4  MCH 32.6 32.4 32.8  --  30.9 31.3  MCHC 32.4 32.0 32.5  --  33.1 33.5  RDW 14.3 14.2 14.4  --  18.6* 18.4*    Chemistries   Recent Labs Lab 08/15/16 2230 08/16/16 0149 08/17/16 0252 08/17/16 1449 08/18/16 0204 08/19/16 0219  NA 134* 137 136 137 135 136  K 3.1* 3.3* 4.2 3.1* 3.3* 3.7  CL 94* 98* 102  --  98* 96*  CO2 28 26 21*  --  26 24  GLUCOSE 195* 183* 188* 147* 151* 182*  BUN 26* 27* 41*  --  15 29*  CREATININE 5.70* 5.78* 8.27*  --  4.27* 6.49*  CALCIUM 7.8* 7.6* 7.9*  --  7.5* 7.7*  MG  --  2.2  --   --   --   --     Lab Results  Component Value Date   HGBA1C 5.0 05/24/2016   Coagulation profile  Recent Labs Lab 08/16/16 0149  INR 1.27   Inpatient Medications  Scheduled Meds: . sodium chloride   Intravenous Once  . darbepoetin (ARANESP) injection - DIALYSIS  200 mcg Intravenous Q Sat-HD  .  dolutegravir  50 mg Oral Daily  . doxercalciferol  2 mcg Intravenous Q T,Th,Sa-HD  . enoxaparin (LOVENOX) injection  30 mg Subcutaneous Q24H  . feeding supplement (NEPRO CARB STEADY)  237 mL Oral BID BM  . glipiZIDE  5 mg Oral QAC breakfast  . insulin aspart  0-5 Units Subcutaneous QHS  . insulin aspart  0-9 Units Subcutaneous TID WC  . lamiVUDine  25 mg Oral Daily  . midodrine  10 mg Oral TID WC  . multivitamin  1 tablet Oral QHS  . pantoprazole  40 mg Oral Daily  . sodium chloride flush  3 mL Intravenous Q12H  . sulfamethoxazole-trimethoprim  1 tablet Oral Daily  . tenofovir  300 mg Oral Q Sun   Continuous Infusions: PRN Meds:.acetaminophen **OR** acetaminophen, menthol-cetylpyridinium **OR** phenol, methocarbamol, metoCLOPramide **OR** metoCLOPramide (REGLAN) injection, morphine injection, morphine injection, ondansetron **OR** ondansetron (ZOFRAN) IV, oxyCODONE, oxyCODONE-acetaminophen, zolpidem  Micro Results Recent Results (from the past 240 hour(s))  Blood culture (routine x 2)     Status: None (Preliminary result)   Collection Time: 08/15/16 10:46 PM  Result Value Ref Range Status   Specimen Description BLOOD RIGHT FOREARM  Final   Special Requests   Final    BOTTLES DRAWN AEROBIC AND ANAEROBIC Blood Culture adequate volume   Culture NO GROWTH 3 DAYS  Final   Report Status PENDING  Incomplete  Blood culture (routine x 2)     Status: None (Preliminary result)   Collection Time: 08/15/16 10:49 PM  Result Value Ref Range Status   Specimen Description BLOOD RIGHT ARM  Final   Special Requests   Final    BOTTLES DRAWN AEROBIC AND ANAEROBIC Blood Culture adequate volume   Culture NO GROWTH 3 DAYS  Final   Report Status PENDING  Incomplete  MRSA PCR Screening     Status: None   Collection Time: 08/16/16  4:24 AM  Result Value Ref Range Status   MRSA by PCR NEGATIVE NEGATIVE Final    Comment:        The GeneXpert MRSA Assay (FDA approved for NASAL specimens only), is one  component of a comprehensive MRSA colonization surveillance program. It is not intended to diagnose MRSA infection nor to guide or monitor treatment for MRSA infections.    Radiology Reports Dg Chest 2 View  Result Date: 08/16/2016 CLINICAL DATA:  Patient fell and found to be hypotensive. EXAM: CHEST  2 VIEW COMPARISON:  CXR 05/08/2016 FINDINGS: The heart size and mediastinal contours are within normal limits. Right IJ dialysis catheter tip in the proximal right atrium. Both lungs are clear. The visualized skeletal structures are unremarkable. IMPRESSION: No active cardiopulmonary disease. Dialysis catheter tip in right atrium. No acute osseous appearing abnormality of the bony thorax. Electronically Signed   By: Ashley Royalty M.D.   On: 08/16/2016 00:00   Dg C-arm 1-60 Min  Result Date: 08/17/2016 CLINICAL DATA:  Internal fixation of left femoral fracture. Initial encounter. EXAM: DG C-ARM 61-120 MIN; LEFT FEMUR 2 VIEWS COMPARISON:  Left femur radiographs performed 08/15/2016 FINDINGS: Four fluoroscopic C-arm images are provided from the OR, demonstrating placement of an intramedullary nail and screws across the patient's left femoral fracture. Underlying impaction is  slightly more conspicuous than on the prior study, with underlying butterfly fragments again seen. The femoral condyles are slightly posteriorly rotated. No new fractures are identified. IMPRESSION: Status post internal fixation of distal femoral fracture. Underlying impaction is slightly more conspicuous than on the prior study, with butterfly fragments again seen. Femoral condyles slightly posteriorly rotated. No new fracture seen. Electronically Signed   By: Garald Balding M.D.   On: 08/17/2016 17:50   Dg Femur Min 2 Views Left  Result Date: 08/17/2016 CLINICAL DATA:  Internal fixation of left femoral fracture. Initial encounter. EXAM: DG C-ARM 61-120 MIN; LEFT FEMUR 2 VIEWS COMPARISON:  Left femur radiographs performed 08/15/2016  FINDINGS: Four fluoroscopic C-arm images are provided from the OR, demonstrating placement of an intramedullary nail and screws across the patient's left femoral fracture. Underlying impaction is slightly more conspicuous than on the prior study, with underlying butterfly fragments again seen. The femoral condyles are slightly posteriorly rotated. No new fractures are identified. IMPRESSION: Status post internal fixation of distal femoral fracture. Underlying impaction is slightly more conspicuous than on the prior study, with butterfly fragments again seen. Femoral condyles slightly posteriorly rotated. No new fracture seen. Electronically Signed   By: Garald Balding M.D.   On: 08/17/2016 17:50   Dg Femur Min 2 Views Left  Result Date: 08/15/2016 CLINICAL DATA:  Patient fell from a walker complaining of left knee pain EXAM: LEFT FEMUR 2 VIEWS COMPARISON:  None. FINDINGS: There is an acute, closed, comminuted impacted supracondylar fracture of the left femur with 1/4 shaft with dorsal displacement of the distal fracture fragments. There is chondrocalcinosis with femorotibial joint space narrowing. Below-knee amputation with intact surgical margins. Extensive calcific arteriosclerosis of the visualized femoral through tibial arteries. IMPRESSION: Acute, closed, comminuted and impacted supracondylar fracture of the left femur with 1/4 shaft width dorsal displacement of the distal fracture fragments. Status post BKA. Electronically Signed   By: Ashley Royalty M.D.   On: 08/15/2016 23:59   Time Spent in minutes  30  Iskra Magick-Myers M.D on 08/19/2016 at 9:30 AM  Between 7am to 7pm - Pager - [phone]  After 7pm go to [email or web address] - password [facility]  [facility] -  Office  [phone]

## 2016-08-19 NOTE — Progress Notes (Signed)
S:Pain controlled.  Says he is eating O:BP 98/63 (BP Location: Right Arm)   Pulse 94   Temp 99.8 F (37.7 C) (Oral)   Resp 18   Ht 5\' 4"  (1.626 m)   Wt 46.3 kg (102 lb)   SpO2 97%   BMI 17.51 kg/m   Intake/Output Summary (Last 24 hours) at 08/19/16 0907 Last data filed at 08/18/16 1800  Gross per 24 hour  Intake              230 ml  Output                0 ml  Net              230 ml   Weight change: 1.167 kg (2 lb 9.2 oz) CXK:GYJEH and alert CVS:RRR Resp:clear Abd:+ BS NTND Ext: No edema LT AVF + bruit NEURO:CNI Ox3 no asterixis Rt IJ PC   . sodium chloride   Intravenous Once  . darbepoetin (ARANESP) injection - DIALYSIS  200 mcg Intravenous Q Sat-HD  . dolutegravir  50 mg Oral Daily  . doxercalciferol  2 mcg Intravenous Q T,Th,Sa-HD  . enoxaparin (LOVENOX) injection  30 mg Subcutaneous Q24H  . feeding supplement (NEPRO CARB STEADY)  237 mL Oral BID BM  . glipiZIDE  5 mg Oral QAC breakfast  . insulin aspart  0-5 Units Subcutaneous QHS  . insulin aspart  0-9 Units Subcutaneous TID WC  . lamiVUDine  25 mg Oral Daily  . midodrine  10 mg Oral TID WC  . multivitamin  1 tablet Oral QHS  . pantoprazole  40 mg Oral Daily  . sodium chloride flush  3 mL Intravenous Q12H  . sulfamethoxazole-trimethoprim  1 tablet Oral Daily  . tenofovir  300 mg Oral Q Sun   Dg C-arm 1-60 Min  Result Date: 08/17/2016 CLINICAL DATA:  Internal fixation of left femoral fracture. Initial encounter. EXAM: DG C-ARM 61-120 MIN; LEFT FEMUR 2 VIEWS COMPARISON:  Left femur radiographs performed 08/15/2016 FINDINGS: Four fluoroscopic C-arm images are provided from the OR, demonstrating placement of an intramedullary nail and screws across the patient's left femoral fracture. Underlying impaction is slightly more conspicuous than on the prior study, with underlying butterfly fragments again seen. The femoral condyles are slightly posteriorly rotated. No new fractures are identified. IMPRESSION: Status post  internal fixation of distal femoral fracture. Underlying impaction is slightly more conspicuous than on the prior study, with butterfly fragments again seen. Femoral condyles slightly posteriorly rotated. No new fracture seen. Electronically Signed   By: Garald Balding M.D.   On: 08/17/2016 17:50   Dg Femur Min 2 Views Left  Result Date: 08/17/2016 CLINICAL DATA:  Internal fixation of left femoral fracture. Initial encounter. EXAM: DG C-ARM 61-120 MIN; LEFT FEMUR 2 VIEWS COMPARISON:  Left femur radiographs performed 08/15/2016 FINDINGS: Four fluoroscopic C-arm images are provided from the OR, demonstrating placement of an intramedullary nail and screws across the patient's left femoral fracture. Underlying impaction is slightly more conspicuous than on the prior study, with underlying butterfly fragments again seen. The femoral condyles are slightly posteriorly rotated. No new fractures are identified. IMPRESSION: Status post internal fixation of distal femoral fracture. Underlying impaction is slightly more conspicuous than on the prior study, with butterfly fragments again seen. Femoral condyles slightly posteriorly rotated. No new fracture seen. Electronically Signed   By: Garald Balding M.D.   On: 08/17/2016 17:50   BMET    Component Value Date/Time   NA 136  08/19/2016 0219   K 3.7 08/19/2016 0219   CL 96 (L) 08/19/2016 0219   CO2 24 08/19/2016 0219   GLUCOSE 182 (H) 08/19/2016 0219   BUN 29 (H) 08/19/2016 0219   CREATININE 6.49 (H) 08/19/2016 0219   CREATININE 4.44 (H) 10/19/2015 1409   CALCIUM 7.7 (L) 08/19/2016 0219   CALCIUM 8.5 03/12/2010 1250   GFRNONAA 9 (L) 08/19/2016 0219   GFRNONAA 15 (L) 10/19/2015 1409   GFRAA 11 (L) 08/19/2016 0219   GFRAA 17 (L) 10/19/2015 1409   CBC    Component Value Date/Time   WBC 5.6 08/19/2016 0219   RBC 2.88 (L) 08/19/2016 0219   HGB 9.0 (L) 08/19/2016 0219   HCT 26.9 (L) 08/19/2016 0219   PLT 162 08/19/2016 0219   MCV 93.4 08/19/2016 0219    MCH 31.3 08/19/2016 0219   MCHC 33.5 08/19/2016 0219   RDW 18.4 (H) 08/19/2016 0219   LYMPHSABS 1,870 07/24/2016 0001   MONOABS 495 07/24/2016 0001   EOSABS 550 (H) 07/24/2016 0001   BASOSABS 110 07/24/2016 0001     Assessment:  1. Lt femur Fx  SP ORIF 2. ESRD TTS NW 3. Anemia on aranesp 4. Sec HPTH on Vit D 5. DM 6. HIV 7. hypotension on midodrine  Plan: 1.  HD tomorrow.  His AVF has been used as outpt and will use here.  If works well then consider getting PC out before DC?   Weylin Plagge T

## 2016-08-19 NOTE — Evaluation (Signed)
Occupational Therapy Evaluation Patient Details Name: Alvin Daniels MRN: [id number] DOB: Dec 10, 1970 Today's Date: 08/19/2016    History of Present Illness 46 y.o. male with medical history significant of diabetes mellitus, GERD, pancreatitis, HCV, HIV (350 on 07/24/2016), ESRD-HD (TTS), chronic diarrhea, s/p of L BKA, chronic hypotension (baseline SBP at ~80s), who presents with left leg pain after fall, femor fx now s/p IM nailing   Clinical Impression   Patient evaluated by Occupational Therapy with no further acute OT needs identified. All education has been completed and the patient has no further questions. See below for any follow-up Occupational Therapy or equipment needs. OT to sign off. Thank you for referral.      Follow Up Recommendations  No OT follow up    Equipment Recommendations  None recommended by OT    Recommendations for Other Services       Precautions / Restrictions Precautions Precautions: Fall Precaution Comments: NWBing residual limb due to IM nailing      Mobility Bed Mobility Overal bed mobility: Independent                Transfers Overall transfer level: Modified independent                    Balance                                           ADL either performed or assessed with clinical judgement   ADL Overall ADL's : Needs assistance/impaired Eating/Feeding: Independent   Grooming: Wash/dry hands;Modified independent               Lower Body Dressing: Supervision/safety   Toilet Transfer: Supervision/safety;Regular Toilet;BSC           Functional mobility during ADLs: Supervision/safety;Rolling walker General ADL Comments: pt completed bed mobility, transfer to 3n1 in the bathroom over toilet and back out to chair. pt reports feeling baseline at this time     Vision   Vision Assessment?: No apparent visual deficits     Perception     Praxis      Pertinent Vitals/Pain Pain  Assessment: Faces Faces Pain Scale: Hurts little more Pain Location: left LE Pain Descriptors / Indicators: Sore;Discomfort Pain Intervention(s): Monitored during session;Premedicated before session;Repositioned;Ice applied     Hand Dominance Right   Extremity/Trunk Assessment Upper Extremity Assessment Upper Extremity Assessment: Overall WFL for tasks assessed   Lower Extremity Assessment Lower Extremity Assessment: LLE deficits/detail LLE Deficits / Details: BKA tolerating well at this time   Cervical / Trunk Assessment Cervical / Trunk Assessment: Normal   Communication Communication Communication: Prefers language other than English Alvin Daniels ID # [id number] via phone)   Cognition Arousal/Alertness: Awake/alert Behavior During Therapy: WFL for tasks assessed/performed Overall Cognitive Status: Within Functional Limits for tasks assessed                                     General Comments  ice applied to L LE     Exercises     Shoulder Instructions      Home Living Family/patient expects to be discharged to:: Private residence Living Arrangements: Other relatives Available Help at Discharge: Friend(s);Available PRN/intermittently Type of Home: Apartment Home Access: Stairs to enter Entrance Stairs-Number of Steps: 2 Entrance Stairs-Rails: None Home Layout: One level  Bathroom Shower/Tub: Teacher, early years/pre: Standard     Home Equipment: Environmental consultant - 2 wheels;Wheelchair - manual   Additional Comments: Pt reports he sits on a bucket to shower/ pt transfers with RW to the bathroom hopping      Prior Functioning/Environment Level of Independence: Independent        Comments: Pt reports one fall ~2 mos ago on the stairs (RW wheel slipped).  pt uses RW to hop and move except WC for HD or community        OT Problem List:        OT Treatment/Interventions:      OT Goals(Current goals can be found in the care plan section)  Acute Rehab OT Goals Patient Stated Goal: none stated  OT Frequency:     Barriers to D/C:            Co-evaluation              End of Session Equipment Utilized During Treatment: Gait belt;Rolling walker Nurse Communication: Mobility status;Precautions;Weight bearing status  Activity Tolerance: Patient tolerated treatment well Patient left: in chair;with call bell/phone within reach  OT Visit Diagnosis: Unsteadiness on feet (R26.81)                Time: 7670-1100 OT Time Calculation (min): 25 min Charges:  OT General Charges $OT Visit: 1 Procedure OT Evaluation $OT Eval Moderate Complexity: 1 Procedure G-Codes:      Alvin Daniels   OTR/L Pager: [phone] Office: [phone] .   Alvin Daniels B 08/19/2016, 12:19 PM

## 2016-08-19 NOTE — Progress Notes (Signed)
Subjective: Doing well.  Pain controlled.  Does not have assistance at home.     Objective: Vital signs in last 24 hours: Temp:  [98.4 F (36.9 C)-99.8 F (37.7 C)] 99.8 F (37.7 C) (04/09 0838) Pulse Rate:  [77-94] 94 (04/09 0838) Resp:  [0-19] 18 (04/09 0838) BP: (82-107)/(58-75) 98/63 (04/09 0838) SpO2:  [96 %-100 %] 97 % (04/09 0838) Weight:  [102 lb (46.3 kg)] 102 lb (46.3 kg) (04/09 0336)  Intake/Output from previous day: 04/08 0701 - 04/09 0700 In: 230 [P.O.:230] Out: -  Intake/Output this shift: No intake/output data recorded.   Recent Labs  08/17/16 0252 08/17/16 1449 08/18/16 0204 08/19/16 0219  HGB 7.7* 9.9* 8.8* 9.0*    Recent Labs  08/18/16 0204 08/19/16 0219  WBC 6.0 5.6  RBC 2.85* 2.88*  HCT 26.6* 26.9*  PLT 148* 162    Recent Labs  08/18/16 0204 08/19/16 0219  NA 135 136  K 3.3* 3.7  CL 98* 96*  CO2 26 24  BUN 15 29*  CREATININE 4.27* 6.49*  GLUCOSE 151* 182*  CALCIUM 7.5* 7.7*   No results for input(s): LABPT, INR in the last 72 hours.  Exam:  Wounds looks good.  Staples intact.  No drainage or signs of infection.   Assessment/Plan: Doing well from ortho standpoint.  States that he does not have assistance at home.  Question rehab vs snf placement.    Alvin Daniels 08/19/2016, 2:24 PM

## 2016-08-19 NOTE — Care Management Note (Addendum)
Case Management Note  Patient Details  Name: Muhammed Teutsch MRN: [id number] Date of Birth: Apr 25, 1971  Subjective/Objective:     Pt admitted with femur fracture post fall               Action/Plan:   Pt is from Home, uses walker for ambulation assistance.  Pt is active with [facility].  CM will continue to follow for discharge needs   Expected Discharge Date:                  Expected Discharge Plan:  [place]  In-House Referral:     Discharge planning Services  CM Consult  Post Acute Care Choice:    Choice offered to:     DME Arranged:    DME Agency:     HH Arranged:    HH Agency:     Status of Service:     If discussed at [employer], dates discussed:    Additional Comments: 08/19/2016 CM text paged attending to ask for discharge consideration.  PT evaluated pt and recommend No PT follow up.  Pt is now 2 days PO femur surgery.   Needs to HD tomorrow to get back on outpt schedule.  Pt does not have supervision at discharge- CM left note for PT for assessment tomorrow   Maryclare Labrador, RN 08/19/2016, 5:18 PM

## 2016-08-20 ENCOUNTER — Encounter (HOSPITAL_COMMUNITY): Payer: Self-pay | Admitting: Orthopaedic Surgery

## 2016-08-20 LAB — CBC
HCT: 27.9 % — ABNORMAL LOW (ref 39.0–52.0)
Hemoglobin: 9.1 g/dL — ABNORMAL LOW (ref 13.0–17.0)
MCH: 30.2 pg (ref 26.0–34.0)
MCHC: 32.6 g/dL (ref 30.0–36.0)
MCV: 92.7 fL (ref 78.0–100.0)
PLATELETS: 184 10*3/uL (ref 150–400)
RBC: 3.01 MIL/uL — AB (ref 4.22–5.81)
RDW: 17.7 % — ABNORMAL HIGH (ref 11.5–15.5)
WBC: 7 10*3/uL (ref 4.0–10.5)

## 2016-08-20 LAB — GLUCOSE, CAPILLARY
GLUCOSE-CAPILLARY: 133 mg/dL — AB (ref 65–99)
Glucose-Capillary: 186 mg/dL — ABNORMAL HIGH (ref 65–99)
Glucose-Capillary: 146 mg/dL — ABNORMAL HIGH (ref 65–99)

## 2016-08-20 LAB — RENAL FUNCTION PANEL
Albumin: 2.4 g/dL — ABNORMAL LOW (ref 3.5–5.0)
Anion gap: 15 (ref 5–15)
BUN: 45 mg/dL — ABNORMAL HIGH (ref 6–20)
CALCIUM: 7.9 mg/dL — AB (ref 8.9–10.3)
CO2: 23 mmol/L (ref 22–32)
CREATININE: 8.41 mg/dL — AB (ref 0.61–1.24)
Chloride: 97 mmol/L — ABNORMAL LOW (ref 101–111)
GFR calc non Af Amer: 7 mL/min — ABNORMAL LOW (ref >60)
GFR, EST AFRICAN AMERICAN: 8 mL/min — AB (ref >60)
Glucose, Bld: 150 mg/dL — ABNORMAL HIGH (ref 65–99)
Phosphorus: 5.1 mg/dL — ABNORMAL HIGH (ref 2.5–4.6)
Potassium: 4 mmol/L (ref 3.5–5.1)
SODIUM: 135 mmol/L (ref 135–145)

## 2016-08-20 LAB — CULTURE, BLOOD (ROUTINE X 2)
CULTURE: NO GROWTH
Culture: NO GROWTH
SPECIAL REQUESTS: ADEQUATE
Special Requests: ADEQUATE

## 2016-08-20 MED ORDER — SODIUM CHLORIDE 0.9 % IV SOLN: 100.0000 mL | INTRAVENOUS | Status: DC | PRN

## 2016-08-20 MED ORDER — ALTEPLASE 2 MG IJ SOLR: 2.0000 mg | Freq: Once | INTRAMUSCULAR | Status: DC | PRN

## 2016-08-20 MED ORDER — LIDOCAINE-PRILOCAINE 2.5-2.5 % EX CREA: 1.0000 "application " | TOPICAL_CREAM | CUTANEOUS | Status: DC | PRN

## 2016-08-20 MED ORDER — SODIUM CHLORIDE 0.9 % IV SOLN
100.0000 mL | INTRAVENOUS | Status: DC | PRN
Start: 2016-08-20 — End: 2016-08-20

## 2016-08-20 MED ORDER — MIDODRINE HCL 5 MG PO TABS
ORAL_TABLET | ORAL | Status: AC
  Administered 2016-08-20: 10 mg via ORAL
  Filled 2016-08-20: qty 2

## 2016-08-20 MED ORDER — HEPARIN SODIUM (PORCINE) 1000 UNIT/ML DIALYSIS: 1000.0000 [IU] | INTRAMUSCULAR | Status: DC | PRN

## 2016-08-20 MED ORDER — OXYCODONE-ACETAMINOPHEN 5-325 MG PO TABS
ORAL_TABLET | ORAL | Status: AC
  Administered 2016-08-20: 1 via ORAL
  Filled 2016-08-20: qty 1

## 2016-08-20 MED ORDER — LIDOCAINE HCL (PF) 1 % IJ SOLN: 5.0000 mL | INTRAMUSCULAR | Status: DC | PRN

## 2016-08-20 MED ORDER — DOXERCALCIFEROL 4 MCG/2ML IV SOLN
INTRAVENOUS | Status: AC
  Administered 2016-08-20: 2 ug via INTRAVENOUS
  Filled 2016-08-20: qty 2

## 2016-08-20 MED ORDER — PENTAFLUOROPROP-TETRAFLUOROETH EX AERO: 1.0000 "application " | INHALATION_SPRAY | CUTANEOUS | Status: DC | PRN

## 2016-08-20 NOTE — Progress Notes (Signed)
Post dialysis, had nausea small amount of vomiting. Not taking po. Medicated with zofran, continues to have nausea, now with headache. States he does get nausea at home. Refusing all routine po meds at this time.  Medicated with reglan  For refractory nausea.

## 2016-08-20 NOTE — Progress Notes (Signed)
Ate breakfast, transferred to dialysis. Report given to dialysis nurse.

## 2016-08-20 NOTE — Plan of Care (Signed)
Problem: Fluid Volume: Goal: Ability to maintain a balanced intake and output will improve Outcome: Progressing Nausea this afternoon  Problem: Nutrition: Goal: Adequate nutrition will be maintained Outcome: Not Progressing Nausea this afternoon, did not eat

## 2016-08-20 NOTE — Progress Notes (Signed)
Patient ID: Alvin Daniels, male   DOB: 06-Jan-1971, 46 y.o.   MRN: [id number]                                                                PROGRESS NOTE                        Patient Demographics:   Alvin Daniels, is a 46 y.o. male, DOB - March 18, 1971, WUJ:[id number]  Admit date - 08/15/2016   Admitting Physician Ivor Costa, MD  Outpatient Primary MD for the patient is [name], MD  LOS - 4  Chief Complaint  Patient presents with  . Leg Pain  . Hypotension      Brief Narrative: 46 y.o. male with medical history significant of diabetes mellitus, GERD, pancreatitis, HCV, HIV (350 on 07/24/2016), ESRD-HD (TTS), chronic diarrhea, s/p of L BKA, chronic hypotension (baseline SBP at ~80s), whopresented with left leg pain after fall. Pt he fell when was using his walker in his house and slipped.  He struck his "back" and the anterior aspect of the L knee.   ED Course: pt was found to have hemoglobin dropped from 11.7-->7.9-->7.5, lactate normal, potassium 3.1, bicarbonate 28, creatinine 5.7, BUN 26, temperature 99.3, O2 saturation 100% on room air, negative chest x-ray. X-ray of left femur showed acute, closed, comminuted and impacted supracondylar fracture of the left femur with 1/4 shaft width dorsal displacement of the distal fracture fragments. Pt admitted to SDU as inpt (due to hypotension, pt was refused from tele floor). Ortho, Dr. Heinz Knuckles was consulted.   Subjective:   POD#2, pt reports no concerns this AM, no chest pain, no abd pain. Hip pain is still intermittent but overall improved.    Assessment  & Plan :   Left Supracondylar femur fracture after the fall - s/p Retrograde the locking femoral nail 300 x 9 mm Biomet. 4/7 - post op day #3 - Up with PT/OT - DVT ppx - SCDs, ambulation, lovenox - NWB operative extremity - analgesia as needed   Chronic hypotension in ESRD patient - Low blood pressure likely contributed by vasculopathy. Patient is clinically  asymptomatic. - continue Midodrine  - pt feels more tired after HD - plan to d/c tomorrow - PT to re evaluate   ESRD  - HD TTS, had HD today, slightly low SBP in 70's - nephrology team following  - monitor overnight and if BP stable, plan d/c in AM  Anemia of chronic kidney disease - with component of acute post op blood loss anemia  - no sign of active bleeding - Hb 7.7 Transfused 2 units of PRBC 4/7 - monitor for now  Hypokalemia - WNL this AM  HIV disease/ Hep C - Continue antiviral medication. Recommended to follow-up with ID outpatient. - cont viread, tivacay, epivir  Chronic thrombocytopenia - Plt counts stable this AM  DM type II with complications of nephropathy, PVD - continue SSI and glipizide   DVT prophylaxis: on lovenox SQ Code Status: Full code Family Communication: No family present at bedside Disposition Plan: home in AM if BP stable   Antibiotics  :  Bactrim 4/6=>, Vanco 4/6  Anti-infectives    Start     Dose/Rate Route Frequency Ordered Stop  08/18/16 1000  tenofovir (VIREAD) tablet 300 mg     300 mg Oral Every Sun 08/16/16 1318     08/17/16 2030  lamiVUDine (EPIVIR) 10 MG/ML solution 25 mg  Status:  Discontinued     25 mg Oral Daily 08/17/16 2019 08/17/16 2022   08/16/16 1800  lamiVUDine (EPIVIR) 10 MG/ML solution 25 mg     25 mg Oral Daily 08/16/16 1318     08/16/16 1000  dolutegravir (TIVICAY) tablet 50 mg     50 mg Oral Daily 08/16/16 0132     08/16/16 1000  sulfamethoxazole-trimethoprim (BACTRIM,SEPTRA) 400-80 MG per tablet 1 tablet     1 tablet Oral Daily 08/16/16 0132     08/16/16 0030  vancomycin (VANCOCIN) 1,500 mg in sodium chloride 0.9 % 500 mL IVPB     1,500 mg 250 mL/hr over 120 Minutes Intravenous  Once 08/16/16 0027 08/16/16 0315      Objective:   Vitals:   08/20/16 1300 08/20/16 1327 08/20/16 1330 08/20/16 1500  BP: (!) 90/56 (!) 86/52 (!) 90/57 114/64  Pulse: 90 91 93   Resp:  14  16  Temp:  97 F (36.1 C)  97.6 F  (36.4 C)  TempSrc:    Oral  SpO2:    100%  Weight:  46.8 kg (103 lb 2.8 oz)    Height:        Wt Readings from Last 3 Encounters:  08/20/16 46.8 kg (103 lb 2.8 oz)  08/14/16 62.6 kg (138 lb)  05/25/16 44.3 kg (97 lb 10.6 oz)    Intake/Output Summary (Last 24 hours) at 08/20/16 1649 Last data filed at 08/20/16 1330  Gross per 24 hour  Intake               60 ml  Output             -500 ml  Net              560 ml   Physical Exam Constitutional: Appears well-developed and well-nourished. No distress.  CVS: RRR, S1/S2 +, no murmurs, no gallops, no carotid bruit.  Pulmonary: Effort and breath sounds normal, no stridor, rhonchi, wheezes, rales.  Abdominal: Soft. BS +,  no distension, tenderness, rebound or guarding.  Ext: R IJ  TDC/ L arm AVF +bruit, L BKA Lymphadenopathy: No lymphadenopathy noted, cervical, inguinal. Neuro: Alert. Normal reflexes, muscle tone coordination   Data Review:   CBC  Recent Labs Lab 08/16/16 1048 08/17/16 0252 08/17/16 1449 08/18/16 0204 08/19/16 0219 08/20/16 0250  WBC 7.1 10.2  --  6.0 5.6 7.0  HGB 7.0* 7.7* 9.9* 8.8* 9.0* 9.1*  HCT 21.9* 23.7* 29.0* 26.6* 26.9* 27.9*  PLT 135* 198  --  148* 162 184  MCV 101.4* 100.9*  --  93.7 93.4 92.7  MCH 32.4 32.8  --  30.9 31.3 30.2  MCHC 32.0 32.5  --  33.1 33.5 32.6  RDW 14.2 14.4  --  18.6* 18.4* 17.7*    Chemistries   Recent Labs Lab 08/16/16 0149 08/17/16 0252 08/17/16 1449 08/18/16 0204 08/19/16 0219 08/20/16 0250  NA 137 136 137 135 136 135  K 3.3* 4.2 3.1* 3.3* 3.7 4.0  CL 98* 102  --  98* 96* 97*  CO2 26 21*  --  26 24 23   GLUCOSE 183* 188* 147* 151* 182* 150*  BUN 27* 41*  --  15 29* 45*  CREATININE 5.78* 8.27*  --  4.27* 6.49* 8.41*  CALCIUM 7.6*  7.9*  --  7.5* 7.7* 7.9*  MG 2.2  --   --   --   --   --     Lab Results  Component Value Date   HGBA1C 5.0 05/24/2016   Coagulation profile  Recent Labs Lab 08/16/16 0149  INR 1.27   Inpatient  Medications  Scheduled Meds: . sodium chloride   Intravenous Once  . darbepoetin (ARANESP) injection - DIALYSIS  200 mcg Intravenous Q Sat-HD  . dolutegravir  50 mg Oral Daily  . doxercalciferol  2 mcg Intravenous Q T,Th,Sa-HD  . enoxaparin (LOVENOX) injection  30 mg Subcutaneous Q24H  . feeding supplement (NEPRO CARB STEADY)  237 mL Oral BID BM  . glipiZIDE  5 mg Oral QAC breakfast  . insulin aspart  0-5 Units Subcutaneous QHS  . insulin aspart  0-9 Units Subcutaneous TID WC  . lamiVUDine  25 mg Oral Daily  . midodrine  10 mg Oral TID WC  . multivitamin  1 tablet Oral QHS  . pantoprazole  40 mg Oral Daily  . sodium chloride flush  3 mL Intravenous Q12H  . sulfamethoxazole-trimethoprim  1 tablet Oral Daily  . tenofovir  300 mg Oral Q Sun   Continuous Infusions: PRN Meds:.acetaminophen **OR** acetaminophen, menthol-cetylpyridinium **OR** phenol, methocarbamol, metoCLOPramide **OR** metoCLOPramide (REGLAN) injection, morphine injection, morphine injection, ondansetron **OR** ondansetron (ZOFRAN) IV, oxyCODONE-acetaminophen, zolpidem  Micro Results Recent Results (from the past 240 hour(s))  Blood culture (routine x 2)     Status: None   Collection Time: 08/15/16 10:46 PM  Result Value Ref Range Status   Specimen Description BLOOD RIGHT FOREARM  Final   Special Requests   Final    BOTTLES DRAWN AEROBIC AND ANAEROBIC Blood Culture adequate volume   Culture NO GROWTH 5 DAYS  Final   Report Status 08/20/2016 FINAL  Final  Blood culture (routine x 2)     Status: None   Collection Time: 08/15/16 10:49 PM  Result Value Ref Range Status   Specimen Description BLOOD RIGHT ARM  Final   Special Requests   Final    BOTTLES DRAWN AEROBIC AND ANAEROBIC Blood Culture adequate volume   Culture NO GROWTH 5 DAYS  Final   Report Status 08/20/2016 FINAL  Final  MRSA PCR Screening     Status: None   Collection Time: 08/16/16  4:24 AM  Result Value Ref Range Status   MRSA by PCR NEGATIVE NEGATIVE  Final    Comment:        The GeneXpert MRSA Assay (FDA approved for NASAL specimens only), is one component of a comprehensive MRSA colonization surveillance program. It is not intended to diagnose MRSA infection nor to guide or monitor treatment for MRSA infections.    Radiology Reports Dg Chest 2 View  Result Date: 08/16/2016 CLINICAL DATA:  Patient fell and found to be hypotensive. EXAM: CHEST  2 VIEW COMPARISON:  CXR 05/08/2016 FINDINGS: The heart size and mediastinal contours are within normal limits. Right IJ dialysis catheter tip in the proximal right atrium. Both lungs are clear. The visualized skeletal structures are unremarkable. IMPRESSION: No active cardiopulmonary disease. Dialysis catheter tip in right atrium. No acute osseous appearing abnormality of the bony thorax. Electronically Signed   By: Ashley Royalty M.D.   On: 08/16/2016 00:00   Dg C-arm 1-60 Min  Result Date: 08/17/2016 CLINICAL DATA:  Internal fixation of left femoral fracture. Initial encounter. EXAM: DG C-ARM 61-120 MIN; LEFT FEMUR 2 VIEWS COMPARISON:  Left femur radiographs performed  08/15/2016 FINDINGS: Four fluoroscopic C-arm images are provided from the OR, demonstrating placement of an intramedullary nail and screws across the patient's left femoral fracture. Underlying impaction is slightly more conspicuous than on the prior study, with underlying butterfly fragments again seen. The femoral condyles are slightly posteriorly rotated. No new fractures are identified. IMPRESSION: Status post internal fixation of distal femoral fracture. Underlying impaction is slightly more conspicuous than on the prior study, with butterfly fragments again seen. Femoral condyles slightly posteriorly rotated. No new fracture seen. Electronically Signed   By: Garald Balding M.D.   On: 08/17/2016 17:50   Dg Femur Min 2 Views Left  Result Date: 08/17/2016 CLINICAL DATA:  Internal fixation of left femoral fracture. Initial encounter.  EXAM: DG C-ARM 61-120 MIN; LEFT FEMUR 2 VIEWS COMPARISON:  Left femur radiographs performed 08/15/2016 FINDINGS: Four fluoroscopic C-arm images are provided from the OR, demonstrating placement of an intramedullary nail and screws across the patient's left femoral fracture. Underlying impaction is slightly more conspicuous than on the prior study, with underlying butterfly fragments again seen. The femoral condyles are slightly posteriorly rotated. No new fractures are identified. IMPRESSION: Status post internal fixation of distal femoral fracture. Underlying impaction is slightly more conspicuous than on the prior study, with butterfly fragments again seen. Femoral condyles slightly posteriorly rotated. No new fracture seen. Electronically Signed   By: Garald Balding M.D.   On: 08/17/2016 17:50   Dg Femur Min 2 Views Left  Result Date: 08/15/2016 CLINICAL DATA:  Patient fell from a walker complaining of left knee pain EXAM: LEFT FEMUR 2 VIEWS COMPARISON:  None. FINDINGS: There is an acute, closed, comminuted impacted supracondylar fracture of the left femur with 1/4 shaft with dorsal displacement of the distal fracture fragments. There is chondrocalcinosis with femorotibial joint space narrowing. Below-knee amputation with intact surgical margins. Extensive calcific arteriosclerosis of the visualized femoral through tibial arteries. IMPRESSION: Acute, closed, comminuted and impacted supracondylar fracture of the left femur with 1/4 shaft width dorsal displacement of the distal fracture fragments. Status post BKA. Electronically Signed   By: Ashley Royalty M.D.   On: 08/15/2016 23:59   Time Spent in minutes  30  Alvin Daniels M.D on 08/20/2016 at 4:49 PM  Between 7am to 7pm - Pager - [phone]  After 7pm go to [email or web address] - password [facility]  [facility] -  Office  [phone]

## 2016-08-20 NOTE — Procedures (Signed)
Pt seen on HD.  Ap 170 Vp 150.  BFR 400.  BP lowish but asymptomatic.  No edema, lungs clear.

## 2016-08-20 NOTE — Progress Notes (Signed)
Patient c/o continuing nausea. Medicated with zofran IV. Patient does not want to take medications for now, due to nausea previous vomiting episode. No c/o shortness of breath, or chest pain.

## 2016-08-20 NOTE — Progress Notes (Signed)
Dialysis treatment completed.  200 mL ultrafiltrated.  -500 mL net fluid removal.  Patient status unchanged. Lung sounds clear to ausculation in all fields. No edema. Cardiac: NSR.  Cleansed RIJ catheter with chlorhexidine.  Disconnected lines and flushed ports with saline per protocol.  Ports locked with heparin and capped per protocol.    Report given to bedside, RN Colletta Maryland.

## 2016-08-20 NOTE — Progress Notes (Signed)
Patient arrived to unit by bed.  Reviewed treatment plan and this RN agrees with plan.  Report received from bedside RN, Colletta Maryland.  Consent verified.  Patient A & O X 4.   Lung sounds diminished to ausculation in all fields. Generalized edema. Cardiac:  NSR.  Removed caps and cleansed RIJ catheter with chlorhedxidine.  Aspirated ports of heparin and flushed them with saline per protocol.  Connected and secured lines, initiated treatment at 0927.  UF Goal of 1500 mL and net fluid removal 1 L.  Will continue to monitor.

## 2016-08-20 NOTE — Progress Notes (Signed)
Subjective: Doing ok.  Pain controlled.     Objective: Vital signs in last 24 hours: Temp:  [98.1 F (36.7 C)-98.2 F (36.8 C)] 98.2 F (36.8 C) (04/10 0703) Pulse Rate:  [75-88] 84 (04/10 0703) Resp:  [0-20] 12 (04/10 0703) BP: (81-122)/(56-96) 122/96 (04/10 0703) SpO2:  [98 %-100 %] 100 % (04/10 0703) Weight:  [102 lb 1.2 oz (46.3 kg)] 102 lb 1.2 oz (46.3 kg) (04/10 0357)  Intake/Output from previous day: No intake/output data recorded. Intake/Output this shift: Total I/O In: 60 [P.O.:60] Out: -    Recent Labs  08/17/16 1449 08/18/16 0204 08/19/16 0219 08/20/16 0250  HGB 9.9* 8.8* 9.0* 9.1*    Recent Labs  08/19/16 0219 08/20/16 0250  WBC 5.6 7.0  RBC 2.88* 3.01*  HCT 26.9* 27.9*  PLT 162 184    Recent Labs  08/19/16 0219 08/20/16 0250  NA 136 135  K 3.7 4.0  CL 96* 97*  CO2 24 23  BUN 29* 45*  CREATININE 6.49* 8.41*  GLUCOSE 182* 150*  CALCIUM 7.7* 7.9*   No results for input(s): LABPT, INR in the last 72 hours.  Exam:   Alert and oriented.  Dressing C/D/I.   Assessment/Plan: Continue present care.  Stable from ortho standpoint.    Benjiman Core 08/20/2016, 8:59 AM

## 2016-08-20 NOTE — Progress Notes (Signed)
Nephrology notified of persistent hypotension, patient non symptomatic.  Will continue to monitor.

## 2016-08-21 ENCOUNTER — Encounter (HOSPITAL_COMMUNITY): Payer: Self-pay | Admitting: Interventional Radiology

## 2016-08-21 ENCOUNTER — Inpatient Hospital Stay (HOSPITAL_COMMUNITY): Payer: Medicaid Other

## 2016-08-21 HISTORY — PX: IR REMOVAL TUN CV CATH W/O FL: IMG2289

## 2016-08-21 LAB — CBC
HCT: 27 % — ABNORMAL LOW (ref 39.0–52.0)
Hemoglobin: 8.7 g/dL — ABNORMAL LOW (ref 13.0–17.0)
MCH: 30.9 pg (ref 26.0–34.0)
MCHC: 32.2 g/dL (ref 30.0–36.0)
MCV: 95.7 fL (ref 78.0–100.0)
PLATELETS: 212 10*3/uL (ref 150–400)
RBC: 2.82 MIL/uL — AB (ref 4.22–5.81)
RDW: 17.8 % — ABNORMAL HIGH (ref 11.5–15.5)
WBC: 8.7 10*3/uL (ref 4.0–10.5)

## 2016-08-21 LAB — GLUCOSE, CAPILLARY
GLUCOSE-CAPILLARY: 156 mg/dL — AB (ref 65–99)
GLUCOSE-CAPILLARY: 160 mg/dL — AB (ref 65–99)
Glucose-Capillary: 117 mg/dL — ABNORMAL HIGH (ref 65–99)
Glucose-Capillary: 251 mg/dL — ABNORMAL HIGH (ref 65–99)

## 2016-08-21 LAB — RENAL FUNCTION PANEL
ANION GAP: 15 (ref 5–15)
Albumin: 2.2 g/dL — ABNORMAL LOW (ref 3.5–5.0)
BUN: 15 mg/dL (ref 6–20)
CHLORIDE: 97 mmol/L — AB (ref 101–111)
CO2: 25 mmol/L (ref 22–32)
Calcium: 8.2 mg/dL — ABNORMAL LOW (ref 8.9–10.3)
Creatinine, Ser: 4.68 mg/dL — ABNORMAL HIGH (ref 0.61–1.24)
GFR calc non Af Amer: 14 mL/min — ABNORMAL LOW (ref >60)
GFR, EST AFRICAN AMERICAN: 16 mL/min — AB (ref >60)
Glucose, Bld: 132 mg/dL — ABNORMAL HIGH (ref 65–99)
Phosphorus: 4 mg/dL (ref 2.5–4.6)
Potassium: 3.8 mmol/L (ref 3.5–5.1)
Sodium: 137 mmol/L (ref 135–145)

## 2016-08-21 MED ORDER — CHLORHEXIDINE GLUCONATE 4 % EX LIQD
CUTANEOUS | Status: AC
  Administered 2016-08-21: 13:00:00
  Filled 2016-08-21: qty 15

## 2016-08-21 MED ORDER — LIDOCAINE HCL 1 % IJ SOLN
INTRAMUSCULAR | Status: AC
  Administered 2016-08-21: 13:00:00
  Filled 2016-08-21: qty 20

## 2016-08-21 NOTE — Progress Notes (Signed)
Patient ID: Alvin Daniels, male   DOB: [birth year], 46 y.o.   MRN: [id number]                                                                PROGRESS NOTE                        Patient Demographics:   Alvin Daniels, is a 46 y.o. male, DOB - 1970-12-11, YKZ:[id number]  Admit date - 08/15/2016   Admitting Physician Ivor Costa, MD  Outpatient Primary MD for the patient is [name], MD  LOS - 5  Chief Complaint  Patient presents with  . Leg Pain  . Hypotension      Brief Narrative: 46 y.o. male with medical history significant of diabetes mellitus, GERD, pancreatitis, HCV, HIV (350 on 07/24/2016), ESRD-HD (TTS), chronic diarrhea, s/p of L BKA, chronic hypotension (baseline SBP at ~80s), whopresented with left leg pain after fall. Pt he fell when was using his walker in his house and slipped.  He struck his "back" and the anterior aspect of the L knee.   ED Course: pt was found to have hemoglobin dropped from 11.7-->7.9-->7.5, lactate normal, potassium 3.1, bicarbonate 28, creatinine 5.7, BUN 26, temperature 99.3, O2 saturation 100% on room air, negative chest x-ray. X-ray of left femur showed acute, closed, comminuted and impacted supracondylar fracture of the left femur with 1/4 shaft width dorsal displacement of the distal fracture fragments. Pt admitted to SDU as inpt (due to hypotension, pt was refused from tele floor). Ortho, Dr. Heinz Knuckles was consulted.   Subjective:   POD#2, pt reports no concerns this AM, no chest pain, no abd pain. Hip pain is still intermittent but overall improved.    Assessment  & Plan :   Left Supracondylar femur fracture after the fall - s/p Retrograde the locking femoral nail 300 x 9 mm Biomet. 4/7 - post op day #4 - Up with PT/OT - DVT ppx - SCDs, ambulation, lovenox - NWB operative extremity - analgesia as needed  - pt reports more warmth at the left thigh but no gross signs of infection.  Staples intact.  No drainage, no swelling.    Chronic hypotension in ESRD patient - Low blood pressure likely contributed by vasculopathy. Patient is clinically asymptomatic. - pt still hypotensive with SBP in 70's today and reports feeling more sick to stomach  - continue Midodrine  - plan for HD in AM  Fever overnight 4/10 - 4/11 - unclear etiology, WBC is stable - will monitor for now - may need further work up   ESRD  - HD TTS, had HD 4/10, low SBP in 70's - nephrology team following  - plan for HD in AM  Anemia of chronic kidney disease - with component of acute post op blood loss anemia  - no sign of active bleeding - Hb 7.7 Transfused 2 units of PRBC 4/7 - monitor for now  Hypokalemia - WNL this AM  HIV disease/ Hep C - Continue antiviral medication. Recommended to follow-up with ID outpatient. - cont viread, tivacay, epivir  Chronic thrombocytopenia - Plt counts stable this AM  DM type II with complications of nephropathy, PVD - continue SSI and glipizide   DVT prophylaxis: on  lovenox SQ Code Status: Full code Family Communication: No family present at bedside Disposition Plan: home in AM if BP stable and no fevers  Antibiotics  :  Bactrim 4/6=>, Vanco 4/6  Anti-infectives    Start     Dose/Rate Route Frequency Ordered Stop   08/18/16 1000  tenofovir (VIREAD) tablet 300 mg     300 mg Oral Every Sun 08/16/16 1318     08/17/16 2030  lamiVUDine (EPIVIR) 10 MG/ML solution 25 mg  Status:  Discontinued     25 mg Oral Daily 08/17/16 2019 08/17/16 2022   08/16/16 1800  lamiVUDine (EPIVIR) 10 MG/ML solution 25 mg     25 mg Oral Daily 08/16/16 1318     08/16/16 1000  dolutegravir (TIVICAY) tablet 50 mg     50 mg Oral Daily 08/16/16 0132     08/16/16 1000  sulfamethoxazole-trimethoprim (BACTRIM,SEPTRA) 400-80 MG per tablet 1 tablet     1 tablet Oral Daily 08/16/16 0132     08/16/16 0030  vancomycin (VANCOCIN) 1,500 mg in sodium chloride 0.9 % 500 mL IVPB     1,500 mg 250 mL/hr over 120 Minutes  Intravenous  Once 08/16/16 0027 08/16/16 0315      Objective:   Vitals:   08/21/16 0353 08/21/16 0357 08/21/16 0648 08/21/16 0827  BP: (!) 70/41 (!) 70/41  (!) 72/42  Pulse:  82  87  Resp: 13 12  14   Temp:  98.6 F (37 C)  98.7 F (37.1 C)  TempSrc:  Oral  Oral  SpO2:  99%    Weight:   51.1 kg (112 lb 10.5 oz)   Height:        Wt Readings from Last 3 Encounters:  08/21/16 51.1 kg (112 lb 10.5 oz)  08/14/16 62.6 kg (138 lb)  05/25/16 44.3 kg (97 lb 10.6 oz)    Intake/Output Summary (Last 24 hours) at 08/21/16 1159 Last data filed at 08/20/16 1330  Gross per 24 hour  Intake                0 ml  Output             -500 ml  Net              500 ml   Physical Exam Constitutional: Appears well-developed and well-nourished. No distress.  CVS: RRR, S1/S2 +, no murmurs, no gallops, no carotid bruit.  Pulmonary: Effort and breath sounds normal, no stridor, rhonchi, wheezes, rales.  Abdominal: Soft. BS +,  no distension, tenderness, rebound or guarding.  Ext: R IJ  TDC/ L arm AVF +bruit, L BKA Lymphadenopathy: No lymphadenopathy noted, cervical, inguinal. Neuro: Alert. Normal reflexes, muscle tone coordination   Data Review:   CBC  Recent Labs Lab 08/17/16 0252 08/17/16 1449 08/18/16 0204 08/19/16 0219 08/20/16 0250 08/21/16 0301  WBC 10.2  --  6.0 5.6 7.0 8.7  HGB 7.7* 9.9* 8.8* 9.0* 9.1* 8.7*  HCT 23.7* 29.0* 26.6* 26.9* 27.9* 27.0*  PLT 198  --  148* 162 184 212  MCV 100.9*  --  93.7 93.4 92.7 95.7  MCH 32.8  --  30.9 31.3 30.2 30.9  MCHC 32.5  --  33.1 33.5 32.6 32.2  RDW 14.4  --  18.6* 18.4* 17.7* 17.8*    Chemistries   Recent Labs Lab 08/16/16 0149 08/17/16 0252 08/17/16 1449 08/18/16 0204 08/19/16 0219 08/20/16 0250 08/21/16 0301  NA 137 136 137 135 136 135 137  K 3.3* 4.2 3.1*  3.3* 3.7 4.0 3.8  CL 98* 102  --  98* 96* 97* 97*  CO2 26 21*  --  26 24 23 25   GLUCOSE 183* 188* 147* 151* 182* 150* 132*  BUN 27* 41*  --  15 29* 45* 15   CREATININE 5.78* 8.27*  --  4.27* 6.49* 8.41* 4.68*  CALCIUM 7.6* 7.9*  --  7.5* 7.7* 7.9* 8.2*  MG 2.2  --   --   --   --   --   --     Lab Results  Component Value Date   HGBA1C 5.0 05/24/2016   Coagulation profile  Recent Labs Lab 08/16/16 0149  INR 1.27   Inpatient Medications  Scheduled Meds: . sodium chloride   Intravenous Once  . chlorhexidine      . darbepoetin (ARANESP) injection - DIALYSIS  200 mcg Intravenous Q Sat-HD  . dolutegravir  50 mg Oral Daily  . doxercalciferol  2 mcg Intravenous Q T,Th,Sa-HD  . enoxaparin (LOVENOX) injection  30 mg Subcutaneous Q24H  . feeding supplement (NEPRO CARB STEADY)  237 mL Oral BID BM  . glipiZIDE  5 mg Oral QAC breakfast  . insulin aspart  0-5 Units Subcutaneous QHS  . insulin aspart  0-9 Units Subcutaneous TID WC  . lamiVUDine  25 mg Oral Daily  . lidocaine      . midodrine  10 mg Oral TID WC  . multivitamin  1 tablet Oral QHS  . pantoprazole  40 mg Oral Daily  . sodium chloride flush  3 mL Intravenous Q12H  . sulfamethoxazole-trimethoprim  1 tablet Oral Daily  . tenofovir  300 mg Oral Q Sun   Continuous Infusions: PRN Meds:.acetaminophen **OR** acetaminophen, menthol-cetylpyridinium **OR** phenol, methocarbamol, metoCLOPramide **OR** metoCLOPramide (REGLAN) injection, ondansetron **OR** ondansetron (ZOFRAN) IV, oxyCODONE-acetaminophen, zolpidem  Micro Results Recent Results (from the past 240 hour(s))  Blood culture (routine x 2)     Status: None   Collection Time: 08/15/16 10:46 PM  Result Value Ref Range Status   Specimen Description BLOOD RIGHT FOREARM  Final   Special Requests   Final    BOTTLES DRAWN AEROBIC AND ANAEROBIC Blood Culture adequate volume   Culture NO GROWTH 5 DAYS  Final   Report Status 08/20/2016 FINAL  Final  Blood culture (routine x 2)     Status: None   Collection Time: 08/15/16 10:49 PM  Result Value Ref Range Status   Specimen Description BLOOD RIGHT ARM  Final   Special Requests    Final    BOTTLES DRAWN AEROBIC AND ANAEROBIC Blood Culture adequate volume   Culture NO GROWTH 5 DAYS  Final   Report Status 08/20/2016 FINAL  Final  MRSA PCR Screening     Status: None   Collection Time: 08/16/16  4:24 AM  Result Value Ref Range Status   MRSA by PCR NEGATIVE NEGATIVE Final    Comment:        The GeneXpert MRSA Assay (FDA approved for NASAL specimens only), is one component of a comprehensive MRSA colonization surveillance program. It is not intended to diagnose MRSA infection nor to guide or monitor treatment for MRSA infections.    Radiology Reports Dg Chest 2 View  Result Date: 08/16/2016 CLINICAL DATA:  Patient fell and found to be hypotensive. EXAM: CHEST  2 VIEW COMPARISON:  CXR 05/08/2016 FINDINGS: The heart size and mediastinal contours are within normal limits. Right IJ dialysis catheter tip in the proximal right atrium. Both lungs are clear. The visualized  skeletal structures are unremarkable. IMPRESSION: No active cardiopulmonary disease. Dialysis catheter tip in right atrium. No acute osseous appearing abnormality of the bony thorax. Electronically Signed   By: Ashley Royalty M.D.   On: 08/16/2016 00:00   Dg C-arm 1-60 Min  Result Date: 08/17/2016 CLINICAL DATA:  Internal fixation of left femoral fracture. Initial encounter. EXAM: DG C-ARM 61-120 MIN; LEFT FEMUR 2 VIEWS COMPARISON:  Left femur radiographs performed 08/15/2016 FINDINGS: Four fluoroscopic C-arm images are provided from the OR, demonstrating placement of an intramedullary nail and screws across the patient's left femoral fracture. Underlying impaction is slightly more conspicuous than on the prior study, with underlying butterfly fragments again seen. The femoral condyles are slightly posteriorly rotated. No new fractures are identified. IMPRESSION: Status post internal fixation of distal femoral fracture. Underlying impaction is slightly more conspicuous than on the prior study, with butterfly  fragments again seen. Femoral condyles slightly posteriorly rotated. No new fracture seen. Electronically Signed   By: Garald Balding M.D.   On: 08/17/2016 17:50   Dg Femur Min 2 Views Left  Result Date: 08/17/2016 CLINICAL DATA:  Internal fixation of left femoral fracture. Initial encounter. EXAM: DG C-ARM 61-120 MIN; LEFT FEMUR 2 VIEWS COMPARISON:  Left femur radiographs performed 08/15/2016 FINDINGS: Four fluoroscopic C-arm images are provided from the OR, demonstrating placement of an intramedullary nail and screws across the patient's left femoral fracture. Underlying impaction is slightly more conspicuous than on the prior study, with underlying butterfly fragments again seen. The femoral condyles are slightly posteriorly rotated. No new fractures are identified. IMPRESSION: Status post internal fixation of distal femoral fracture. Underlying impaction is slightly more conspicuous than on the prior study, with butterfly fragments again seen. Femoral condyles slightly posteriorly rotated. No new fracture seen. Electronically Signed   By: Garald Balding M.D.   On: 08/17/2016 17:50   Dg Femur Min 2 Views Left  Result Date: 08/15/2016 CLINICAL DATA:  Patient fell from a walker complaining of left knee pain EXAM: LEFT FEMUR 2 VIEWS COMPARISON:  None. FINDINGS: There is an acute, closed, comminuted impacted supracondylar fracture of the left femur with 1/4 shaft with dorsal displacement of the distal fracture fragments. There is chondrocalcinosis with femorotibial joint space narrowing. Below-knee amputation with intact surgical margins. Extensive calcific arteriosclerosis of the visualized femoral through tibial arteries. IMPRESSION: Acute, closed, comminuted and impacted supracondylar fracture of the left femur with 1/4 shaft width dorsal displacement of the distal fracture fragments. Status post BKA. Electronically Signed   By: Ashley Royalty M.D.   On: 08/15/2016 23:59   Time Spent in minutes  30  Alvin Daniels  Alvin Daniels M.D on 08/21/2016 at 11:59 AM  Between 7am to 7pm - Pager - [phone]  After 7pm go to [email or web address] - password [facility]  [facility] -  Office  [phone]

## 2016-08-21 NOTE — Progress Notes (Signed)
Subjective: Doing well.  Pain controlled.  States that left thigh feels "hot".     Objective: Vital signs in last 24 hours: Temp:  [97 F (36.1 C)-100.4 F (38 C)] 98.7 F (37.1 C) (04/11 0827) Pulse Rate:  [82-100] 87 (04/11 0827) Resp:  [12-20] 14 (04/11 0827) BP: (70-114)/(39-64) 72/42 (04/11 0827) SpO2:  [93 %-100 %] 99 % (04/11 0357) Weight:  [103 lb 2.8 oz (46.8 kg)-112 lb 10.5 oz (51.1 kg)] 112 lb 10.5 oz (51.1 kg) (04/11 0648)  Intake/Output from previous day: 04/10 0701 - 04/11 0700 In: 60 [P.O.:60] Out: -500  Intake/Output this shift: No intake/output data recorded.   Recent Labs  08/19/16 0219 08/20/16 0250 08/21/16 0301  HGB 9.0* 9.1* 8.7*    Recent Labs  08/20/16 0250 08/21/16 0301  WBC 7.0 8.7  RBC 3.01* 2.82*  HCT 27.9* 27.0*  PLT 184 212    Recent Labs  08/20/16 0250 08/21/16 0301  NA 135 137  K 4.0 3.8  CL 97* 97*  CO2 23 25  BUN 45* 15  CREATININE 8.41* 4.68*  GLUCOSE 150* 132*  CALCIUM 7.9* 8.2*   No results for input(s): LABPT, INR in the last 72 hours.  Exam:  Pleasant hispanic male alert and oriented. NAD. Left thigh slight increased warmth.  Thigh tender.  No gross signs of infection.  Staples intact.  No drainage.  No much swelling.    Assessment/Plan: Patient stable from ortho standpoint.  Continue present care.    Benjiman Core 08/21/2016, 9:32 AM

## 2016-08-21 NOTE — Care Management Note (Signed)
Case Management Note  Patient Details  Name: Alvin Daniels MRN: [id number] Date of Birth: 1970/08/05  Subjective/Objective:     Pt admitted with femur fracture post fall               Action/Plan:   Pt is from Home, uses walker for ambulation assistance.  Pt is active with [facility].  CM will continue to follow for discharge needs   Expected Discharge Date:                  Expected Discharge Plan:  [place]  In-House Referral:     Discharge planning Services  CM Consult  Post Acute Care Choice:    Choice offered to:     DME Arranged:    DME Agency:     HH Arranged:    HH Agency:     Status of Service:     If discussed at [employer], dates discussed:    Additional Comments: 08/21/2016  CM spoke directly with attending.  Pt was going to discharge home yesterday however experienced n/v post HD that required IV zosyn.  Pt went to IR yesterday to have temp HD cath removed - plans is to have HD tomorrow via fistula and possible discharge home in the afternoon  08/19/16 CM text paged attending to ask for discharge consideration.  PT evaluated pt and recommend No PT follow up.  Pt is now 2 days PO femur surgery.   Needs to HD tomorrow to get back on outpt schedule.  Pt does not have supervision at discharge- CM left note for PT for assessment tomorrow   Maryclare Labrador, RN 08/21/2016, 3:27 PM

## 2016-08-21 NOTE — Progress Notes (Signed)
Arrived back to room from IR. Right chest dressing clean dry and intact. No pain at location of old Tunneled catheter.  States pain in leg is small, 2-3.

## 2016-08-21 NOTE — Progress Notes (Signed)
Report called to charge RN on 6E

## 2016-08-21 NOTE — Progress Notes (Signed)
Physical Therapy Treatment Patient Details Name: Alvin Daniels MRN: [id number] DOB: [birth year] Today's Date: 08/21/2016    History of Present Illness 46 y.o. male with medical history significant of diabetes mellitus, GERD, pancreatitis, HCV, HIV (350 on 07/24/2016), ESRD-HD (TTS), chronic diarrhea, s/p of L BKA, chronic hypotension (baseline SBP at ~80s), who presents with left leg pain after fall, femor fx now s/p IM nailing    PT Comments    Pt presented supine in bed with HOB elevated, awake and willing to participate in therapy session. However, pt only willing to ambulate from bed to recliner chair. He also participated in L LE strengthening exercises (see below). All VSS throughout. PT will continue to follow acutely to ensure a safe d/c home.    Follow Up Recommendations  No PT follow up;Supervision for mobility/OOB     Equipment Recommendations  None recommended by PT    Recommendations for Other Services       Precautions / Restrictions Precautions Precautions: Fall Precaution Comments: NWBing residual limb due to IM nailing Restrictions Weight Bearing Restrictions: No    Mobility  Bed Mobility Overal bed mobility: Independent             General bed mobility comments: no physical assist required  Transfers Overall transfer level: Modified independent Equipment used: Rolling walker (2 wheeled) Transfers: Sit to/from Stand Sit to Stand: Supervision         General transfer comment: supervision to transition into standing from bed  Ambulation/Gait Ambulation/Gait assistance: Min guard Ambulation Distance (Feet): 5 Feet Assistive device: Rolling walker (2 wheeled) Gait Pattern/deviations: Step-through pattern (hop-through on R LE) Gait velocity: decreased Gait velocity interpretation: <1.8 ft/sec, indicative of risk for recurrent falls General Gait Details: pt only agreeable to ambulate from bed to recliner chair. Pt with mild instability but  no LOB or need for physical assistance.   Stairs            Wheelchair Mobility    Modified Rankin (Stroke Patients Only)       Balance Overall balance assessment: History of Falls;Needs assistance   Sitting balance-Leahy Scale: Fair     Standing balance support: Bilateral upper extremity supported;During functional activity Standing balance-Leahy Scale: Poor Standing balance comment: pt reliant on bilateral UEs on RW                            Cognition Arousal/Alertness: Awake/alert Behavior During Therapy: WFL for tasks assessed/performed Overall Cognitive Status: Within Functional Limits for tasks assessed                                        Exercises Amputee Exercises Hip ABduction/ADduction: AROM;Left;10 reps;Seated Hip Flexion/Marching: AROM;Left;10 reps;Seated Knee Flexion: AROM;Left;10 reps;Seated Knee Extension: AROM;Left;10 reps;Seated Straight Leg Raises: AROM;Left;10 reps;Seated    General Comments        Pertinent Vitals/Pain Pain Assessment: Faces Faces Pain Scale: Hurts a little bit Pain Location: L residual limb Pain Descriptors / Indicators: Sore;Discomfort Pain Intervention(s): Monitored during session;Repositioned    Home Living                      Prior Function            PT Goals (current goals can now be found in the care plan section) Acute Rehab PT Goals PT Goal Formulation: With patient Time  For Goal Achievement: 09/01/16 Potential to Achieve Goals: Good Progress towards PT goals: Progressing toward goals    Frequency    Min 3X/week      PT Plan Current plan remains appropriate    Co-evaluation             End of Session Equipment Utilized During Treatment: Gait belt Activity Tolerance: Patient tolerated treatment well Patient left: in chair;with call bell/phone within reach;with chair alarm set Nurse Communication: Mobility status PT Visit Diagnosis: Repeated  falls (R29.6)     Time: 1533-1550 PT Time Calculation (min) (ACUTE ONLY): 17 min  Charges:  $Therapeutic Exercise: 8-22 mins                    G Codes:       [place], PT, [place] 08/21/2016, 4:15 PM

## 2016-08-21 NOTE — Progress Notes (Signed)
S:Pain controlled.  Says he is eating O:BP (!) 72/42 (BP Location: Right Arm)   Pulse 87   Temp 98.7 F (37.1 C) (Oral)   Resp 14   Ht 5\' 4"  (1.626 m)   Wt 51.1 kg (112 lb 10.5 oz)   SpO2 99%   BMI 19.34 kg/m   Intake/Output Summary (Last 24 hours) at 08/21/16 0837 Last data filed at 08/20/16 1330  Gross per 24 hour  Intake                0 ml  Output             -500 ml  Net              500 ml   Weight change: 0 kg (0 lb) YOV:ZCHYI and alert CVS:RRR Resp:clear Abd:+ BS NTND Ext: No edema LT AVF + bruit NEURO:CNI Ox3 no asterixis Rt IJ PC   . sodium chloride   Intravenous Once  . darbepoetin (ARANESP) injection - DIALYSIS  200 mcg Intravenous Q Sat-HD  . dolutegravir  50 mg Oral Daily  . doxercalciferol  2 mcg Intravenous Q Daniels,Th,Sa-HD  . enoxaparin (LOVENOX) injection  30 mg Subcutaneous Q24H  . feeding supplement (NEPRO CARB STEADY)  237 mL Oral BID BM  . glipiZIDE  5 mg Oral QAC breakfast  . insulin aspart  0-5 Units Subcutaneous QHS  . insulin aspart  0-9 Units Subcutaneous TID WC  . lamiVUDine  25 mg Oral Daily  . midodrine  10 mg Oral TID WC  . multivitamin  1 tablet Oral QHS  . pantoprazole  40 mg Oral Daily  . sodium chloride flush  3 mL Intravenous Q12H  . sulfamethoxazole-trimethoprim  1 tablet Oral Daily  . tenofovir  300 mg Oral Q Sun   No results found. BMET    Component Value Date/Time   NA 137 08/21/2016 0301   K 3.8 08/21/2016 0301   CL 97 (L) 08/21/2016 0301   CO2 25 08/21/2016 0301   GLUCOSE 132 (H) 08/21/2016 0301   BUN 15 08/21/2016 0301   CREATININE 4.68 (H) 08/21/2016 0301   CREATININE 4.44 (H) 10/19/2015 1409   CALCIUM 8.2 (L) 08/21/2016 0301   CALCIUM 8.5 03/12/2010 1250   GFRNONAA 14 (L) 08/21/2016 0301   GFRNONAA 15 (L) 10/19/2015 1409   GFRAA 16 (L) 08/21/2016 0301   GFRAA 17 (L) 10/19/2015 1409   CBC    Component Value Date/Time   WBC 8.7 08/21/2016 0301   RBC 2.82 (L) 08/21/2016 0301   HGB 8.7 (L) 08/21/2016 0301   HCT 27.0 (L) 08/21/2016 0301   PLT 212 08/21/2016 0301   MCV 95.7 08/21/2016 0301   MCH 30.9 08/21/2016 0301   MCHC 32.2 08/21/2016 0301   RDW 17.8 (H) 08/21/2016 0301   LYMPHSABS 1,870 07/24/2016 0001   MONOABS 495 07/24/2016 0001   EOSABS 550 (H) 07/24/2016 0001   BASOSABS 110 07/24/2016 0001     Assessment:  1. Lt femur Fx  SP ORIF 2. ESRD TTS NW 3. Anemia on aranesp 4. Sec HPTH on Vit D 5. DM 6. HIV 7. hypotension on midodrine.  BP low but he is asymptomatic  Plan: 1.  HD tomorrow.  2. IR notified to DC perm Cath   Alvin Daniels

## 2016-08-21 NOTE — Plan of Care (Signed)
Problem: Pain Managment: Goal: General experience of comfort will improve Outcome: Progressing c/o pain at intervals in leg, medicated accordingly  Problem: Nutrition: Goal: Adequate nutrition will be maintained Outcome: Progressing Less nasuea today.

## 2016-08-22 DIAGNOSIS — D631 Anemia in chronic kidney disease: Secondary | ICD-10-CM

## 2016-08-22 DIAGNOSIS — N185 Chronic kidney disease, stage 5: Secondary | ICD-10-CM

## 2016-08-22 DIAGNOSIS — N186 End stage renal disease: Secondary | ICD-10-CM

## 2016-08-22 DIAGNOSIS — E1122 Type 2 diabetes mellitus with diabetic chronic kidney disease: Secondary | ICD-10-CM

## 2016-08-22 DIAGNOSIS — Z992 Dependence on renal dialysis: Secondary | ICD-10-CM

## 2016-08-22 LAB — GLUCOSE, CAPILLARY
GLUCOSE-CAPILLARY: 160 mg/dL — AB (ref 65–99)
Glucose-Capillary: 113 mg/dL — ABNORMAL HIGH (ref 65–99)

## 2016-08-22 LAB — RENAL FUNCTION PANEL
ALBUMIN: 2.3 g/dL — AB (ref 3.5–5.0)
Anion gap: 12 (ref 5–15)
BUN: 30 mg/dL — AB (ref 6–20)
CALCIUM: 8.1 mg/dL — AB (ref 8.9–10.3)
CO2: 25 mmol/L (ref 22–32)
CREATININE: 6.93 mg/dL — AB (ref 0.61–1.24)
Chloride: 98 mmol/L — ABNORMAL LOW (ref 101–111)
GFR calc Af Amer: 10 mL/min — ABNORMAL LOW (ref >60)
GFR, EST NON AFRICAN AMERICAN: 9 mL/min — AB (ref >60)
GLUCOSE: 178 mg/dL — AB (ref 65–99)
PHOSPHORUS: 4.7 mg/dL — AB (ref 2.5–4.6)
POTASSIUM: 4.1 mmol/L (ref 3.5–5.1)
SODIUM: 135 mmol/L (ref 135–145)

## 2016-08-22 LAB — CBC
HEMATOCRIT: 26.8 % — AB (ref 39.0–52.0)
Hemoglobin: 8.8 g/dL — ABNORMAL LOW (ref 13.0–17.0)
MCH: 31.3 pg (ref 26.0–34.0)
MCHC: 32.8 g/dL (ref 30.0–36.0)
MCV: 95.4 fL (ref 78.0–100.0)
PLATELETS: 213 10*3/uL (ref 150–400)
RBC: 2.81 MIL/uL — ABNORMAL LOW (ref 4.22–5.81)
RDW: 17.6 % — AB (ref 11.5–15.5)
WBC: 7.9 10*3/uL (ref 4.0–10.5)

## 2016-08-22 MED ORDER — OXYCODONE-ACETAMINOPHEN 5-325 MG PO TABS: 1.0000 | ORAL_TABLET | ORAL | 0 refills | Status: DC | PRN

## 2016-08-22 MED ORDER — MIDODRINE HCL 5 MG PO TABS
ORAL_TABLET | ORAL | Status: AC
  Filled 2016-08-22: qty 2

## 2016-08-22 MED ORDER — DOXERCALCIFEROL 4 MCG/2ML IV SOLN
INTRAVENOUS | Status: AC
  Administered 2016-08-22: 2 ug via INTRAVENOUS
  Filled 2016-08-22: qty 2

## 2016-08-22 NOTE — Progress Notes (Signed)
Pt  Refused to turn to look at sacrum,pt stated" the Doctor told me not to turn because of my left BKA he told me to sit at all times will cont to monitor

## 2016-08-22 NOTE — Progress Notes (Signed)
Alvin Daniels to be D/C'd Home per MD order.  Discussed prescriptions and follow up appointments with the patient. Prescriptions given to patient, medication list explained in detail. Pt verbalized understanding.  Allergies as of 08/22/2016   No Known Allergies     Medication List    TAKE these medications   glipiZIDE 5 MG tablet Commonly known as:  GLUCOTROL Take 5 mg by mouth daily before breakfast.   lamiVUDine 10 MG/ML solution Commonly known as:  EPIVIR Take 25 mg by mouth daily.   midodrine 10 MG tablet Commonly known as:  PROAMATINE Take 1 tablet (10 mg total) by mouth 3 (three) times daily with meals.   multivitamin Tabs tablet Take 1 tablet by mouth at bedtime.   omeprazole 40 MG capsule Commonly known as:  PRILOSEC TAKE 1 CAPSULE BY MOUTH EVERY DAY   oxyCODONE-acetaminophen 5-325 MG tablet Commonly known as:  PERCOCET/ROXICET Take 1 tablet by mouth every 4 (four) hours as needed for moderate pain.   sulfamethoxazole-trimethoprim 400-80 MG tablet Commonly known as:  BACTRIM,SEPTRA TAKE 1 TABLET BY MOUTH DAILY   tenofovir 300 MG tablet Commonly known as:  VIREAD TAKE 1 TABLET BY MOUTH ONCE WEEKLY ON SUNDAY   TIVICAY 50 MG tablet Generic drug:  dolutegravir TAKE 1 TABLET BY MOUTH DAILY       Vitals:   08/22/16 1240 08/22/16 1302  BP: 102/66 (!) 93/57  Pulse: 90 92  Resp: 14 15  Temp: 98 F (36.7 C) 97.9 F (36.6 C)    Skin clean, dry and intact without evidence of skin break down, no evidence of skin tears noted. IV catheter discontinued intact. Site without signs and symptoms of complications. Dressing and pressure applied. Pt denies pain at this time. No complaints noted.  An After Visit Summary was printed and given to the patient. Patient escorted via [place], and D/C home via private auto.  Alvin Math, RN [facility] Phone [phone]

## 2016-08-22 NOTE — Care Management Note (Addendum)
Case Management Note  Patient Details  Name: [name] MRN: [id number] Date of Birth: May 01, 1971  Subjective/Objective:      CM following for progression and d/c planning.               Action/Plan: 08/22/2016 Noted that pt slow to participate in PT session , we will be unable to arrange HHPT as pt iinsurance will not cover. Will continue to follow for d/c needs.  Hospital followup appointment scheduled at [facility] for Monday, August 26, 2016 @ 1:30pm.  Expected Discharge Date:                  Expected Discharge Plan:  [place]  In-House Referral:     Discharge planning Services  CM Consult  Post Acute Care Choice:    Choice offered to:     DME Arranged:    DME Agency:     HH Arranged:    HH Agency:     Status of Service:     If discussed at [employer], dates discussed:    Additional Comments:  Adron Bene, RN 08/22/2016, 10:51 AM

## 2016-08-22 NOTE — Procedures (Signed)
Pt seen on HD.  BP runs chronically low but asymptomatic.  Only trying for 1 liter.  Rt PC pulled yest.   AVF stuck with 16g needles.

## 2016-08-22 NOTE — Discharge Summary (Signed)
Physician Discharge Summary  Malakie Balis SEG:[id number] DOB: 1970/11/15 DOA: 08/15/2016  PCP: [name], MD  Admit date: 08/15/2016 Discharge date: 08/22/2016  Recommendations for Outpatient Follow-up:  1. Pt will need to follow up with PCP in 1-2 weeks post discharge 2. Please obtain BMP to evaluate electrolytes and kidney function 3. Please also check CBC to evaluate Hg and Hct levels  Discharge Diagnoses:  Principal Problem:   Femur fracture, left ([place]) Active Problems:   Human immunodeficiency virus (HIV) disease ([place])   Diabetes mellitus with ESRD (end-stage renal disease) ([place])   HIV (human immunodeficiency virus infection) ([place])  Discharge Condition: Stable  Diet recommendation: renal diet  Brief Narrative: 46 y.o.malewith medical history significant of diabetes mellitus, GERD, pancreatitis, HCV, HIV (350 on 07/24/2016), ESRD-HD (TTS), chronic diarrhea, s/p of L BKA, chronic hypotension (baseline SBP at ~80s), whopresented with left leg pain after fall. Pt he fell when was using his walker in his house and slipped. He struck his "back" and the anterior aspect of the L knee.   ED Course:pt was found to have hemoglobin dropped from 11.7-->7.9-->7.5, lactate normal, potassium 3.1, bicarbonate 28, creatinine 5.7, BUN 26, temperature 99.3, O2 saturation 100% on room air, negative chest x-ray. X-ray of left femur showed acute, closed, comminuted and impacted supracondylar fracture of the left femur with 1/4 shaft width dorsal displacement of the distal fracture fragments. Pt admitted to SDU as inpt (due to hypotension, pt was refused from tele floor). Ortho, Dr. Heinz Knuckles was consulted.   Subjective:   Pt reports no concerns this AM, no chest pain, no abd pain. Hip pain is still intermittent but overall improved.    Assessment  & Plan :   Left Supracondylar femur fracture after the fall - s/p Retrograde the locking femoral nail 300 x 9 mm Biomet. 4/7 - post op  day #5 - Up with PT/OT - DVT ppx - SCDs, ambulation, lovenox - NWBoperative extremity - analgesia as needed  - pt reports more warmth at the left thigh but no gross signs of infection. Staples intact. No drainage, no swelling.  - pt wantes to go home, cleared from ortho stand point   Chronic hypotension in ESRD patient - Low blood pressure likely contributed by vasculopathy. Patient is clinically asymptomatic. - BP now better in 90's - continue Midodrine   Fever overnight 4/10 - 4/11 - unclear etiology, WBC is stable - will monitor for now - no fevers in the past 24 hours   ESRD  - HD TTS, had HD 4/10, low SBP in 70's - nephrology team following   Anemia of chronic kidney disease - with component of acute post op blood loss anemia  - no sign of active bleeding - Hb 7.7 Transfused 2 units of PRBC 4/7  Hypokalemia - WNL this AM  HIV disease/ Hep C - Continue antiviral medication. Recommended to follow-up with ID outpatient. - cont viread, tivacay, epivir  Chronic thrombocytopenia - Plt counts stable this AM  DM type II with complications of nephropathy, PVD - continue SSI and glipizide   DVT prophylaxis: on lovenox SQ Code Status: Full code Family Communication: No family present at bedside Disposition Plan: home  Antibiotics  :  Bactrim 4/6=>, Vanco 4/6    Procedures/Studies: Dg Chest 2 View  Result Date: 08/16/2016 CLINICAL DATA:  Patient fell and found to be hypotensive. EXAM: CHEST  2 VIEW COMPARISON:  CXR 05/08/2016 FINDINGS: The heart size and mediastinal contours are within normal limits. Right IJ dialysis  catheter tip in the proximal right atrium. Both lungs are clear. The visualized skeletal structures are unremarkable. IMPRESSION: No active cardiopulmonary disease. Dialysis catheter tip in right atrium. No acute osseous appearing abnormality of the bony thorax. Electronically Signed   By: Ashley Royalty M.D.   On: 08/16/2016 00:00   Ir Removal  Tun Cv Cath W/o Fl  Result Date: 08/21/2016 CLINICAL DATA:  Functioning hemodialysis fistula. Tunneled hemodialysis catheter placed 05/22/2016 by Dr. Annamaria Boots , no longer needed EXAM: TUNNELED HEMODIALYSIS CATHETER REMOVAL TECHNIQUE: The procedure, risks (including but not limited to bleeding, infection, organ damage ), benefits, and alternatives were explained to the patient. Questions regarding the procedure were encouraged and answered. The patient understands and consents to the procedure. Overlying skin prepped with chlorhexidine, draped in usual sterile fashion, infiltrated locally with 1% lidocaine. The previously placed tunneled right IJ hemodialysis catheter was dissected free from the underlying soft tissues and removed intact. Hemostasis was achieved. Site covered with a sterile dressing. The patient tolerated the procedure well. COMPLICATIONS: none IMPRESSION: 1. Technically successful tunneled hemodialysis catheter removal. Electronically Signed   By: Lucrezia Europe M.D.   On: 08/21/2016 14:05   Dg C-arm 1-60 Min  Result Date: 08/17/2016 CLINICAL DATA:  Internal fixation of left femoral fracture. Initial encounter. EXAM: DG C-ARM 61-120 MIN; LEFT FEMUR 2 VIEWS COMPARISON:  Left femur radiographs performed 08/15/2016 FINDINGS: Four fluoroscopic C-arm images are provided from the OR, demonstrating placement of an intramedullary nail and screws across the patient's left femoral fracture. Underlying impaction is slightly more conspicuous than on the prior study, with underlying butterfly fragments again seen. The femoral condyles are slightly posteriorly rotated. No new fractures are identified. IMPRESSION: Status post internal fixation of distal femoral fracture. Underlying impaction is slightly more conspicuous than on the prior study, with butterfly fragments again seen. Femoral condyles slightly posteriorly rotated. No new fracture seen. Electronically Signed   By: Garald Balding M.D.   On: 08/17/2016  17:50   Dg Femur Min 2 Views Left  Result Date: 08/17/2016 CLINICAL DATA:  Internal fixation of left femoral fracture. Initial encounter. EXAM: DG C-ARM 61-120 MIN; LEFT FEMUR 2 VIEWS COMPARISON:  Left femur radiographs performed 08/15/2016 FINDINGS: Four fluoroscopic C-arm images are provided from the OR, demonstrating placement of an intramedullary nail and screws across the patient's left femoral fracture. Underlying impaction is slightly more conspicuous than on the prior study, with underlying butterfly fragments again seen. The femoral condyles are slightly posteriorly rotated. No new fractures are identified. IMPRESSION: Status post internal fixation of distal femoral fracture. Underlying impaction is slightly more conspicuous than on the prior study, with butterfly fragments again seen. Femoral condyles slightly posteriorly rotated. No new fracture seen. Electronically Signed   By: Garald Balding M.D.   On: 08/17/2016 17:50   Dg Femur Min 2 Views Left  Result Date: 08/15/2016 CLINICAL DATA:  Patient fell from a walker complaining of left knee pain EXAM: LEFT FEMUR 2 VIEWS COMPARISON:  None. FINDINGS: There is an acute, closed, comminuted impacted supracondylar fracture of the left femur with 1/4 shaft with dorsal displacement of the distal fracture fragments. There is chondrocalcinosis with femorotibial joint space narrowing. Below-knee amputation with intact surgical margins. Extensive calcific arteriosclerosis of the visualized femoral through tibial arteries. IMPRESSION: Acute, closed, comminuted and impacted supracondylar fracture of the left femur with 1/4 shaft width dorsal displacement of the distal fracture fragments. Status post BKA. Electronically Signed   By: Ashley Royalty M.D.   On: 08/15/2016 23:59  Discharge Exam: Vitals:   08/22/16 1130 08/22/16 1200  BP: 118/69 96/62  Pulse: 87 87  Resp:    Temp:     Vitals:   08/22/16 1030 08/22/16 1100 08/22/16 1130 08/22/16 1200  BP:  (!) 76/48 (!) 133/45 118/69 96/62  Pulse: 81 86 87 87  Resp:      Temp:      TempSrc:      SpO2:      Weight:      Height:        General: Pt is alert, follows commands appropriately, not in acute distress Cardiovascular: Regular rate and rhythm, S1/S2 +, no murmurs, no rubs, no gallops Respiratory: Clear to auscultation bilaterally, no wheezing, no crackles, no rhonchi Abdominal: Soft, non tender, non distended, bowel sounds +, no guarding  Discharge Instructions  Discharge Instructions    Diet - low sodium heart healthy    Complete by:  As directed    Increase activity slowly    Complete by:  As directed      Allergies as of 08/22/2016   No Known Allergies     Medication List    TAKE these medications   glipiZIDE 5 MG tablet Commonly known as:  GLUCOTROL Take 5 mg by mouth daily before breakfast.   lamiVUDine 10 MG/ML solution Commonly known as:  EPIVIR Take 25 mg by mouth daily.   midodrine 10 MG tablet Commonly known as:  PROAMATINE Take 1 tablet (10 mg total) by mouth 3 (three) times daily with meals.   multivitamin Tabs tablet Take 1 tablet by mouth at bedtime.   omeprazole 40 MG capsule Commonly known as:  PRILOSEC TAKE 1 CAPSULE BY MOUTH EVERY DAY   oxyCODONE-acetaminophen 5-325 MG tablet Commonly known as:  PERCOCET/ROXICET Take 1 tablet by mouth every 4 (four) hours as needed for moderate pain.   sulfamethoxazole-trimethoprim 400-80 MG tablet Commonly known as:  BACTRIM,SEPTRA TAKE 1 TABLET BY MOUTH DAILY   tenofovir 300 MG tablet Commonly known as:  VIREAD TAKE 1 TABLET BY MOUTH ONCE WEEKLY ON SUNDAY   TIVICAY 50 MG tablet Generic drug:  dolutegravir TAKE 1 TABLET BY MOUTH DAILY      Follow-up Information    [facility] Follow up.   Why:  Hospital followup appointment : Monday, August 26, 2016 at 1:30 pm.  Please call and reschedule if you are unable to keep this appointment.  Contact information: [place] [phone]       [name], MD Follow up.   Specialty:  Family Medicine Contact information: [place] [address] [phone]          The results of significant diagnostics from this hospitalization (including imaging, microbiology, ancillary and laboratory) are listed below for reference.    Microbiology: Recent Results (from the past 240 hour(s))  Blood culture (routine x 2)     Status: None   Collection Time: 08/15/16 10:46 PM  Result Value Ref Range Status   Specimen Description BLOOD RIGHT FOREARM  Final   Special Requests   Final    BOTTLES DRAWN AEROBIC AND ANAEROBIC Blood Culture adequate volume   Culture NO GROWTH 5 DAYS  Final   Report Status 08/20/2016 FINAL  Final  Blood culture (routine x 2)     Status: None   Collection Time: 08/15/16 10:49 PM  Result Value Ref Range Status   Specimen Description BLOOD RIGHT ARM  Final  Special Requests   Final    BOTTLES DRAWN AEROBIC AND ANAEROBIC Blood Culture adequate volume   Culture NO GROWTH 5 DAYS  Final   Report Status 08/20/2016 FINAL  Final  MRSA PCR Screening     Status: None   Collection Time: 08/16/16  4:24 AM  Result Value Ref Range Status   MRSA by PCR NEGATIVE NEGATIVE Final    Comment:        The GeneXpert MRSA Assay (FDA approved for NASAL specimens only), is one component of a comprehensive MRSA colonization surveillance program. It is not intended to diagnose MRSA infection nor to guide or monitor treatment for MRSA infections.      Labs: Basic Metabolic Panel:  Recent Labs Lab 08/16/16 0149  08/18/16 0204 08/19/16 0219 08/20/16 0250 08/21/16 0301 08/22/16 0408  NA 137  < > 135 136 135 137 135  K 3.3*  < > 3.3* 3.7 4.0 3.8 4.1  CL 98*  < > 98* 96* 97* 97* 98*  CO2 26  < > 26 24 23 25 25   GLUCOSE 183*  < > 151* 182* 150* 132* 178*  BUN 27*  < > 15 29* 45* 15 30*  CREATININE 5.78*  < > 4.27* 6.49*  8.41* 4.68* 6.93*  CALCIUM 7.6*  < > 7.5* 7.7* 7.9* 8.2* 8.1*  MG 2.2  --   --   --   --   --   --   PHOS  --   --   --   --  5.1* 4.0 4.7*  < > = values in this interval not displayed. Liver Function Tests:  Recent Labs Lab 08/20/16 0250 08/21/16 0301 08/22/16 0408  ALBUMIN 2.4* 2.2* 2.3*   CBC:  Recent Labs Lab 08/18/16 0204 08/19/16 0219 08/20/16 0250 08/21/16 0301 08/22/16 0408  WBC 6.0 5.6 7.0 8.7 7.9  HGB 8.8* 9.0* 9.1* 8.7* 8.8*  HCT 26.6* 26.9* 27.9* 27.0* 26.8*  MCV 93.7 93.4 92.7 95.7 95.4  PLT 148* 162 184 212 213   Cardiac Enzymes:  Recent Labs Lab 08/15/16 2230  CKTOTAL 79   CBG:  Recent Labs Lab 08/21/16 0757 08/21/16 1224 08/21/16 1601 08/21/16 2030 08/22/16 0805  GLUCAP 117* 156* 251* 160* 160*   SIGNED: Time coordinating discharge: 30 minutes  Faye Ramsay, MD  [facility] 08/22/2016, 12:05 PM Pager [phone]  If 7PM-7AM, please contact night-coverage [email or web address] Password [email or web address]

## 2016-08-22 NOTE — Discharge Instructions (Signed)
Colocacin de fstula AV, cuidados posteriores (AV Fistula Placement, Care After) Siga estas instrucciones durante las prximas semanas. Estas indicaciones le proporcionan informacin acerca de cmo deber cuidarse despus del procedimiento. El mdico tambin podr darle instrucciones ms especficas. El tratamiento ha sido planificado segn las prcticas mdicas actuales, pero en algunos casos pueden ocurrir problemas. Comunquese con el mdico si tiene algn problema o dudas despus del procedimiento. QU ESPERAR DESPUS DEL PROCEDIMIENTO Despus del procedimiento, es comn [employer] siguientes sntomas:  Dispensing optician.  Tener entumecimiento.  Sentir fro.  Sentir una vibracin (temblor) sobre la fstula. INSTRUCCIONES PARA EL CUIDADO EN EL HOGAR Cuidado de la incisin  No tome baos de inmersin, no se duche, no nade ni use el jacuzzi hasta que el mdico lo autorice.  Mantenga la zona que rodea el corte de la ciruga (incisin) limpia y Audiological scientist.  Siga las indicaciones del mdico acerca del cuidado de la incisin. Haga lo siguiente:  [employer] con agua y jabn antes de [place]ar[place] manager las vendas (vendaje). Use desinfectante para manos si no dispone de [place].  Cambie el vendaje como se lo haya indicado el mdico.  No retire los puntos (suturas) ni las grapas. Tal vez deban dejarse puestos en la piel durante 2semanas o ms tiempo. Cuidado de Psychologist, counselling de la fstula todos los das para asegurarse de que el temblor se sienta igual.  Psychiatric nurse de la fstula todos los das para descartar signos de infeccin. Est atento a lo siguiente:  Enrojecimiento.  Hinchazn.  Secrecin.  Dolor a Secretary/administrator.  Agrandamiento.  Mantenga el brazo levantado (elevado) mientras descansa.  No levante nada que pese ms de un galn de leche (4,5kg) con el brazo que tiene la fstula.  No se recueste sobre el brazo de la fstula.  No permita que le saquen  sangre o le tomen la presin arterial en el brazo de la fstula.  No use joyas ni ropa apretadas en el brazo que tiene la fstula. Instrucciones generales  Descanse por [employer].  Retome sus actividades habituales de forma gradual. Pregntele al Gretchen Short cundo podr volver a la escuela o al [place].  Tome los medicamentos de venta libre y los recetados solamente como se lo haya indicado el mdico.  Consulting civil engineer a todas las visitas de control como se lo haya indicado el mdico. Esto es importante. SOLICITE ATENCIN MDICA SI:  Tiene escalofros o fiebre.  Siente dolor en el lugar de la fstula, que no desaparece.  Tiene entumecimiento o sensacin de fro en el lugar de la fstula, que no desaparece.  Siente una disminucin o un cambio en el temblor.  Tiene hinchazn en la mano o el brazo.  Tiene enrojecimiento, hinchazn, secrecin, dolor a la palpacin o agrandamiento del lugar de la fstula. SOLICITE ATENCIN MDICA DE INMEDIATO SI:  Tiene una hemorragia que emana de la fstula.  Siente dolor en el pecho.  Tiene dificultad para respirar. Esta informacin no tiene Marine scientist el consejo del mdico. Asegrese de hacerle al mdico cualquier pregunta que tenga. Document Released: 04/29/2005 Document Revised: 08/21/2015 Document Reviewed: 07/20/2014 Elsevier Interactive Patient Education  2017 [employer].

## 2016-08-24 DIAGNOSIS — S7292XA Unspecified fracture of left femur, initial encounter for closed fracture: Secondary | ICD-10-CM | POA: Insufficient documentation

## 2016-08-26 ENCOUNTER — Inpatient Hospital Stay: Payer: Self-pay

## 2016-09-04 ENCOUNTER — Ambulatory Visit (INDEPENDENT_AMBULATORY_CARE_PROVIDER_SITE_OTHER): Payer: Self-pay | Admitting: Infectious Disease

## 2016-09-04 ENCOUNTER — Encounter: Payer: Self-pay | Admitting: Infectious Disease

## 2016-09-04 VITALS — BP 85/48 | HR 94 | Temp 98.1°F

## 2016-09-04 DIAGNOSIS — I9589 Other hypotension: Secondary | ICD-10-CM

## 2016-09-04 DIAGNOSIS — R51 Headache with orthostatic component, not elsewhere classified: Secondary | ICD-10-CM

## 2016-09-04 DIAGNOSIS — B182 Chronic viral hepatitis C: Secondary | ICD-10-CM

## 2016-09-04 DIAGNOSIS — R55 Syncope and collapse: Secondary | ICD-10-CM

## 2016-09-04 DIAGNOSIS — B20 Human immunodeficiency virus [HIV] disease: Secondary | ICD-10-CM

## 2016-09-04 DIAGNOSIS — Z89512 Acquired absence of left leg below knee: Secondary | ICD-10-CM

## 2016-09-04 DIAGNOSIS — Z23 Encounter for immunization: Secondary | ICD-10-CM

## 2016-09-04 DIAGNOSIS — S72452A Displaced supracondylar fracture without intracondylar extension of lower end of left femur, initial encounter for closed fracture: Secondary | ICD-10-CM

## 2016-09-04 DIAGNOSIS — E1122 Type 2 diabetes mellitus with diabetic chronic kidney disease: Secondary | ICD-10-CM

## 2016-09-04 DIAGNOSIS — N186 End stage renal disease: Secondary | ICD-10-CM

## 2016-09-04 DIAGNOSIS — I951 Orthostatic hypotension: Secondary | ICD-10-CM

## 2016-09-04 MED ORDER — BICTEGRAVIR-EMTRICITAB-TENOFOV 50-200-25 MG PO TABS: 1.0000 | ORAL_TABLET | Freq: Every day | ORAL | 11 refills | Status: DC

## 2016-09-04 NOTE — Patient Instructions (Signed)
He needs appt with Alvin Daniels in next few weeks after change of medications
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
## 2016-09-05 NOTE — Addendum Note (Signed)
Addended by: Janyce Llanos F on: 09/05/2016 11:09 AM   Modules accepted: Orders

## 2016-09-09 ENCOUNTER — Telehealth: Payer: Self-pay | Admitting: *Deleted

## 2016-09-09 ENCOUNTER — Inpatient Hospital Stay (INDEPENDENT_AMBULATORY_CARE_PROVIDER_SITE_OTHER): Payer: 59 | Admitting: Orthopaedic Surgery

## 2016-09-09 NOTE — Telephone Encounter (Signed)
RN was asked to assist with locating a housing option for Alvin Daniels. Currently Alvin Daniels is living with family who will be relocating and Alvin Daniels cannot go with them. Several emailed conversations have been taking place to discuss possible housing options for Alvin Daniels. RN had a conference call with Leila Moore/Congregational nurse program about Alvin possibility for Alvin Daniels to be housed through [facility] [employer]. We discussed Alvin criteria for [facility] [place] program and Alvin thought is Alvin Alvin Daniels needs more supervision and could be a liability due to his chronically low BP, drops in BG levels and diabetic neuropathy. Suszanne Conners will be speaking with [facility] to see if Alvin Daniels can be accepted into Alvin shelter, without having to go out during Alvin day. This option will provide  SW, Nurse and case management on site along with other residents to keep a eye on Alvin Daniels. They would also be able to extend Alvin 57day time limit for Alvin Daniels to stay at [facility] shelter.

## 2016-09-11 ENCOUNTER — Other Ambulatory Visit: Payer: Self-pay | Admitting: Family Medicine

## 2016-09-11 ENCOUNTER — Other Ambulatory Visit: Payer: Self-pay | Admitting: Infectious Disease

## 2016-09-12 ENCOUNTER — Telehealth: Payer: Self-pay | Admitting: *Deleted

## 2016-09-12 NOTE — Telephone Encounter (Signed)
RN has been a part of several conversations about the patient's housing needs. The end result is that [place] would like to meet the patient prior to him becoming homeless. RN contacted Alvin Daniels(through interpreter services) and explained the about information to him. Alvin Daniels stated the earliest he would be able to get to [place] would be next Wednesday between 8:30 and 9am. Patient stated he cannot read English for me to be able to text him the address and time of appt and he does not have a pen and paper to write the address down. RN agreed to ask Alvin Daniels at the Clinic to please text him in spanish the appt date, time and location. [facility]'s address in [place]

## 2016-09-18 ENCOUNTER — Encounter: Payer: Self-pay | Admitting: Pediatric Intensive Care

## 2016-10-14 NOTE — Telephone Encounter (Signed)
RN contacted the patient trough translation(Spanish/[name]) of the address for [facility]. They have requested a interview with him to assess his level of function. RN confirmed the address, date and time along with reason for the visit to Alvin Daniels. He verbalized a understanding of information given

## 2016-10-16 ENCOUNTER — Other Ambulatory Visit: Payer: Self-pay | Admitting: Infectious Disease

## 2016-10-16 ENCOUNTER — Other Ambulatory Visit: Payer: Self-pay | Admitting: Pharmacist Clinician (PhC)/ Clinical Pharmacy Specialist

## 2016-10-16 DIAGNOSIS — B20 Human immunodeficiency virus [HIV] disease: Secondary | ICD-10-CM

## 2016-10-16 MED ORDER — SULFAMETHOXAZOLE-TRIMETHOPRIM 400-80 MG PO TABS: 1.0000 | ORAL_TABLET | Freq: Every day | ORAL | 0 refills | Status: DC

## 2016-10-22 NOTE — Congregational Nurse Program (Signed)
Congregational Nurse Program Note  Date of Encounter: 09/18/2016  Past Medical History: Past Medical History:  Diagnosis Date  . AIDS ([place]) 11/22/2014  . Chronic diarrhea   . Chronic hepatitis C without hepatic coma ([place]) 11/22/2014  . Diabetic neuropathy ([place])   . ESRD (end stage renal disease) on dialysis [facility])    "TTS; don't remember street name" (05/03/2014)  . Hepatitis C   . HIV INFECTION 06/27/2010   Qualifier: Diagnosis of  By: Nickola Major CMA ( [place]), Geni Bers    . Hypotension 06/02/2012  . Metabolic bone disease 0/0/2984  . MRSA infection   . Normocytic anemia 06/17/2012  . Pancreatitis   . Pressure ulcer of BKA stump, stage 2 ([place]) 11/22/2014  . Renal disorder   . Severe protein-calorie malnutrition ([place]) 06/17/2012  . Uncontrolled diabetes mellitus with complications ([place]) 12/10/8567   Annotation: uncontrolled Qualifier: Diagnosis of  By: Nickola Major CMA Deborra Medina), Geni Bers      Encounter Details:  Via interpreter Drema Dallas, [facility] case worker, met with client at dialysis clinic with [facility] case manager Tasha present. CN had been contacted by case manager at [facility] regarding client. CN asked client if he had any housing plans past the move out time with the host family. He stated that the landlord at the apartment had granted him a grace period to stay in his apartment. [facility] case manager and CN advised client that he could contact [facility] for any housing issues or if he knew that he would be losing housing. CN asked client if he would need any food support or assistance in the apartment with ADLS. Client stated that he could do his ADLs but that he may need food assistance and assistance cleaning. CN and [facility] case manager encouraged client to have Tasha contact [facility] if he thought his needs would change.

## 2016-11-11 ENCOUNTER — Ambulatory Visit: Payer: Self-pay | Admitting: Infectious Disease

## 2016-12-04 ENCOUNTER — Other Ambulatory Visit: Payer: Self-pay | Admitting: Infectious Disease

## 2016-12-04 DIAGNOSIS — B20 Human immunodeficiency virus [HIV] disease: Secondary | ICD-10-CM

## 2016-12-19 ENCOUNTER — Ambulatory Visit: Payer: Self-pay

## 2017-01-02 ENCOUNTER — Encounter: Payer: Self-pay | Admitting: Infectious Disease

## 2017-01-02 NOTE — Addendum Note (Signed)
Addendum  created 01/02/17 1244 by Roberts Gaudy, MD   Sign clinical note

## 2017-01-08 ENCOUNTER — Ambulatory Visit: Payer: 59 | Admitting: Podiatry

## 2017-01-09 ENCOUNTER — Telehealth: Payer: Self-pay | Admitting: *Deleted

## 2017-01-09 ENCOUNTER — Encounter: Payer: Self-pay | Admitting: Internal Medicine

## 2017-01-09 ENCOUNTER — Ambulatory Visit (INDEPENDENT_AMBULATORY_CARE_PROVIDER_SITE_OTHER): Payer: Self-pay | Admitting: Internal Medicine

## 2017-01-09 DIAGNOSIS — T300 Burn of unspecified body region, unspecified degree: Secondary | ICD-10-CM

## 2017-01-09 DIAGNOSIS — B20 Human immunodeficiency virus [HIV] disease: Secondary | ICD-10-CM

## 2017-01-09 NOTE — Telephone Encounter (Signed)
Patient's case worker, Audelia Hives, notified of appt at [facility] surgery clinic for 01/21/17 at 10:30 AM. Audelia Hives with arrange [facility] transportation for patient. Myrtis Hopping

## 2017-01-09 NOTE — Assessment & Plan Note (Signed)
congtinuing with [employer]
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
## 2017-01-14 ENCOUNTER — Other Ambulatory Visit (HOSPITAL_COMMUNITY): Payer: Self-pay | Admitting: Nephrology

## 2017-01-14 DIAGNOSIS — N186 End stage renal disease: Secondary | ICD-10-CM

## 2017-01-16 ENCOUNTER — Other Ambulatory Visit: Payer: Self-pay | Admitting: General Surgery

## 2017-01-16 ENCOUNTER — Other Ambulatory Visit: Payer: Self-pay | Admitting: Student

## 2017-01-17 ENCOUNTER — Encounter (HOSPITAL_COMMUNITY): Payer: Self-pay

## 2017-01-17 ENCOUNTER — Ambulatory Visit (HOSPITAL_COMMUNITY)
Admission: RE | Admit: 2017-01-17 | Discharge: 2017-01-17 | Disposition: A | Discharge status: Home / Self Care | Payer: Self-pay | Source: Ambulatory Visit | Attending: Nephrology | Admitting: Nephrology

## 2017-01-23 ENCOUNTER — Encounter (HOSPITAL_COMMUNITY): Payer: Self-pay | Admitting: Emergency Medicine

## 2017-01-23 ENCOUNTER — Emergency Department (HOSPITAL_COMMUNITY)
Admission: EM | Admit: 2017-01-23 | Discharge: 2017-01-24 | Disposition: A | Discharge status: Home / Self Care | Payer: Self-pay | Attending: Emergency Medicine | Admitting: Emergency Medicine

## 2017-01-23 ENCOUNTER — Emergency Department (HOSPITAL_COMMUNITY): Payer: Self-pay

## 2017-01-23 DIAGNOSIS — S42022A Displaced fracture of shaft of left clavicle, initial encounter for closed fracture: Secondary | ICD-10-CM | POA: Insufficient documentation

## 2017-01-23 DIAGNOSIS — Y999 Unspecified external cause status: Secondary | ICD-10-CM | POA: Insufficient documentation

## 2017-01-23 DIAGNOSIS — E1122 Type 2 diabetes mellitus with diabetic chronic kidney disease: Secondary | ICD-10-CM | POA: Insufficient documentation

## 2017-01-23 DIAGNOSIS — B20 Human immunodeficiency virus [HIV] disease: Secondary | ICD-10-CM | POA: Insufficient documentation

## 2017-01-23 DIAGNOSIS — Z79899 Other long term (current) drug therapy: Secondary | ICD-10-CM | POA: Insufficient documentation

## 2017-01-23 DIAGNOSIS — X58XXXA Exposure to other specified factors, initial encounter: Secondary | ICD-10-CM | POA: Insufficient documentation

## 2017-01-23 DIAGNOSIS — Y939 Activity, unspecified: Secondary | ICD-10-CM | POA: Insufficient documentation

## 2017-01-23 DIAGNOSIS — N186 End stage renal disease: Secondary | ICD-10-CM | POA: Insufficient documentation

## 2017-01-23 DIAGNOSIS — Y929 Unspecified place or not applicable: Secondary | ICD-10-CM | POA: Insufficient documentation

## 2017-01-23 LAB — COMPREHENSIVE METABOLIC PANEL
ALK PHOS: 127 U/L — AB (ref 38–126)
ALT: 10 U/L — ABNORMAL LOW (ref 17–63)
ANION GAP: 10 (ref 5–15)
AST: 26 U/L (ref 15–41)
Albumin: 3.3 g/dL — ABNORMAL LOW (ref 3.5–5.0)
BUN: 10 mg/dL (ref 6–20)
CALCIUM: 9 mg/dL (ref 8.9–10.3)
CO2: 31 mmol/L (ref 22–32)
Chloride: 95 mmol/L — ABNORMAL LOW (ref 101–111)
Creatinine, Ser: 4.06 mg/dL — ABNORMAL HIGH (ref 0.61–1.24)
GFR, EST AFRICAN AMERICAN: 19 mL/min — AB (ref >60)
GFR, EST NON AFRICAN AMERICAN: 16 mL/min — AB (ref >60)
Glucose, Bld: 261 mg/dL — ABNORMAL HIGH (ref 65–99)
Potassium: 4 mmol/L (ref 3.5–5.1)
SODIUM: 136 mmol/L (ref 135–145)
TOTAL PROTEIN: 8.9 g/dL — AB (ref 6.5–8.1)
Total Bilirubin: 0.6 mg/dL (ref 0.3–1.2)

## 2017-01-23 LAB — CBC WITH DIFFERENTIAL/PLATELET
BASOS ABS: 0.1 10*3/uL (ref 0.0–0.1)
Basophils Relative: 1 %
Eosinophils Absolute: 0.5 10*3/uL (ref 0.0–0.7)
Eosinophils Relative: 7 %
HEMATOCRIT: 34.3 % — AB (ref 39.0–52.0)
Hemoglobin: 11.3 g/dL — ABNORMAL LOW (ref 13.0–17.0)
LYMPHS PCT: 22 %
Lymphs Abs: 1.6 10*3/uL (ref 0.7–4.0)
MCH: 34.6 pg — ABNORMAL HIGH (ref 26.0–34.0)
MCHC: 32.9 g/dL (ref 30.0–36.0)
MCV: 104.9 fL — AB (ref 78.0–100.0)
MONO ABS: 0.5 10*3/uL (ref 0.1–1.0)
Monocytes Relative: 7 %
NEUTROS ABS: 4.7 10*3/uL (ref 1.7–7.7)
Neutrophils Relative %: 63 %
Platelets: 217 10*3/uL (ref 150–400)
RBC: 3.27 MIL/uL — AB (ref 4.22–5.81)
RDW: 13.1 % (ref 11.5–15.5)
WBC: 7.4 10*3/uL (ref 4.0–10.5)

## 2017-01-23 LAB — I-STAT CG4 LACTIC ACID, ED: Lactic Acid, Venous: 1.26 mmol/L (ref 0.5–1.9)

## 2017-01-23 MED ORDER — ACETAMINOPHEN 325 MG PO TABS
650.0000 mg | ORAL_TABLET | Freq: Once | ORAL | Status: AC
  Administered 2017-01-23: 650 mg via ORAL
  Filled 2017-01-23: qty 2

## 2017-01-23 MED ORDER — ACETAMINOPHEN 325 MG PO TABS
650.0000 mg | ORAL_TABLET | Freq: Once | ORAL | Status: AC
  Administered 2017-01-24: 650 mg via ORAL
  Filled 2017-01-23: qty 2

## 2017-01-23 NOTE — ED Triage Notes (Signed)
Pt reports left shoulder pain when lifts it up and moves a certain way. Has fistula in that arm that appears WNL. Had dialysis today. Has hx of bone disease and is immunocompromised.

## 2017-01-23 NOTE — ED Provider Notes (Signed)
[facility] DEPT Provider Note   CSN: [id number] Arrival date & time: 01/23/17  1425     History   Chief Complaint Chief Complaint  Patient presents with  . Shoulder Pain    HPI Alvin Daniels is a 46 y.o. male.  This is a 46 year old male who presents with left shoulder pain which she states has occurred for the past 8 days.  He denies any falls, heavy lifting, any trauma.  He denies other injuries or pain elsewhere.  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  Patient does not have insurance. Pain is located in the anterior left shoulder overlying the left clavicle.   The history is provided by the patient. The history is limited by a language barrier. A language interpreter was used.    Past Medical History:  Diagnosis Date  . AIDS ([facility]) 11/22/2014  . Chronic diarrhea   . Chronic hepatitis C without hepatic coma ([place]) 11/22/2014  . Diabetic neuropathy ([place])   . ESRD (end stage renal disease) on dialysis [facility])    "TTS; don't remember street name" (05/03/2014)  . Hepatitis C   . HIV INFECTION 06/27/2010   Qualifier: Diagnosis of  By: Nickola Major CMA ( [place]), Geni Bers    . Hypotension 06/02/2012  . Metabolic bone disease 6/0/6301  . MRSA infection   . Normocytic anemia 06/17/2012  . Pancreatitis   . Pressure ulcer of BKA stump, stage 2 ([place]) 11/22/2014  . Renal disorder   . Severe protein-calorie malnutrition ([place]) 06/17/2012  . Uncontrolled diabetes mellitus with complications ([place]) 10/12/930   Annotation: uncontrolled Qualifier: Diagnosis of  By: Nickola Major CMA Deborra Medina), Jacqueline      Patient Active Problem List   Diagnosis Date Noted  . Femur fracture, left ([place]) 08/16/2016  . HIV (human immunodeficiency virus infection) ([place]) 08/16/2016  . Supracondylar fracture of femur, left, closed, initial encounter ([place])   . Hyperglycemia 05/24/2016  . Hypoglycemia 05/24/2016  . GERD (gastroesophageal reflux disease)  05/24/2016  . Hypokalemia 05/24/2016  . Acute encephalopathy 05/24/2016  . Burn 04/10/2016  . Hypothermia 04/02/2016  . Colitis   . Pressure ulcer of BKA stump, stage 2 ([place]) 11/22/2014  . Chronic hepatitis C without hepatic coma ([place]) 11/22/2014  . Below knee amputation status ([place]) 06/17/2014  . Diabetic osteomyelitis ([place])   . Blood poisoning   . Poor dentition 10/25/2013  . ESRD on dialysis ([place]) 10/25/2013  . Vision changes 06/21/2013  . Abdominal pain 04/19/2013  . Orthostatic hypotension 04/15/2013  . Orthostatic headache 04/15/2013  . Anemia of chronic renal failure 06/19/2012  . Headache(784.0) 06/18/2012  . Metabolic bone disease 35/57/3220  . Chronic diarrhea 06/17/2012  . Severe protein-calorie malnutrition ([place]) 06/17/2012  . Normocytic anemia 06/17/2012  . Syncope and collapse 06/02/2012  . Hypotension 06/02/2012  . Human immunodeficiency virus (HIV) disease ([place]) 06/27/2010  . RENAL FAILURE, END STAGE 06/27/2010  . Polyneuropathy in diabetes(357.2) 06/27/2010  . Diabetes mellitus with ESRD (end-stage renal disease) ([place]) 01/03/2010    Past Surgical History:  Procedure Laterality Date  . AMPUTATION Left 04/20/2014   Procedure: 3rd toe amputation, 4th Toe Amputation,  5th Toe Amputation;  Surgeon: Newt Minion, MD;  Location: [facility];  Service: Orthopedics;  Laterality: Left;  . AMPUTATION Left 05/02/2014   Procedure: Midfoot Amputation;  Surgeon: Newt Minion, MD;  Location: [facility];  Service: Orthopedics;  Laterality: Left;  . AMPUTATION Left 06/17/2014   Procedure: AMPUTATION BELOW KNEE;  Surgeon:  Newt Minion, MD;  Location: [facility];  Service: Orthopedics;  Laterality: Left;  . AV FISTULA PLACEMENT Left   . AV FISTULA PLACEMENT Left 05/10/2016   Procedure: Creation Left Arm Brachiocephalic Arteriovenous Fistula and Ligation of Radiocephalic Fistula;  Surgeon: Angelia Mould, MD;  Location: [facility];  Service: Vascular;  Laterality: Left;  . FEMUR IM NAIL Left  08/17/2016   Procedure: INTRAMEDULLARY (IM) RETROGRADE FEMORAL NAILING;  Surgeon: Marybelle Killings, MD;  Location: [facility];  Service: Orthopedics;  Laterality: Left;  . FOOT AMPUTATION THROUGH ANKLE Left 12/'21/2015   midfoot  . IR GENERIC HISTORICAL Left 05/01/2016   IR THROMBECTOMY AV FISTULA W/THROMBOLYSIS/PTA INC/SHUNT/IMG LEFT 05/01/2016 [name], MD [facility]  . IR GENERIC HISTORICAL  05/01/2016   IR US GUIDE VASC ACCESS LEFT 05/01/2016 [name], MD [facility]  . IR GENERIC HISTORICAL  05/07/2016   IR FLUORO GUIDE CV LINE RIGHT 05/07/2016 Corrie Mckusick, DO [facility]  . IR GENERIC HISTORICAL  05/07/2016   IR US GUIDE VASC ACCESS RIGHT 05/07/2016 Corrie Mckusick, DO [facility]  . IR GENERIC HISTORICAL  05/22/2016   IR US GUIDE VASC ACCESS RIGHT 05/22/2016 Greggory Keen, MD [facility]  . IR GENERIC HISTORICAL  05/22/2016   IR FLUORO GUIDE CV LINE RIGHT 05/22/2016 Greggory Keen, MD [facility]  . IR REMOVAL TUN CV CATH W/O FL  08/21/2016  . PERIPHERAL VASCULAR CATHETERIZATION Left 05/09/2016   Procedure: A/V Fistulagram;  Surgeon: Angelia Mould, MD;  Location: [facility];  Service: Cardiovascular;  Laterality: Left;  arm       Home Medications    Prior to Admission medications   Medication Sig Start Date End Date Taking? Authorizing Provider  bictegravir-emtricitabine-tenofovir AF (BIKTARVY) 50-200-25 MG TABS tablet Take 1 tablet by mouth daily. 09/04/16   Truman Hayward, MD  glipiZIDE (GLUCOTROL) 5 MG tablet Take 5 mg by mouth daily before breakfast.    [provider]  HYDROcodone-acetaminophen (NORCO/VICODIN) 5-325 MG tablet Take 1 tablet by mouth every 6 (six) hours as needed for moderate pain. 01/24/17 01/27/17  Aldona Lento, MD  midodrine (PROAMATINE) 10 MG tablet Take 1 tablet (10 mg total) by mouth 3 (three) times daily with meals. Patient not taking: Reported on 08/16/2016 04/24/16   [name], MD  multivitamin (RENA-VIT) TABS  tablet Take 1 tablet by mouth at bedtime. 03/06/16   [place], Cristal Ford, NP  omeprazole (PRILOSEC) 40 MG capsule TAKE 1 CAPSULE BY MOUTH EVERY DAY 04/24/16   [name], MD  oxyCODONE-acetaminophen (PERCOCET/ROXICET) 5-325 MG tablet Take 1 tablet by mouth every 4 (four) hours as needed for moderate pain. 08/22/16   Theodis Blaze, MD  sulfamethoxazole-trimethoprim (BACTRIM,SEPTRA) 400-80 MG tablet TAKE 1 TABLET BY MOUTH DAILY 12/04/16   Campbell Riches, MD    Family History Family History  Problem Relation Age of Onset  . Diabetes Mother   . Diabetes Father     Social History Social History  Substance Use Topics  . Smoking status: Never Smoker  . Smokeless tobacco: Never Used  . Alcohol use No     Allergies   Patient has no known allergies.   Review of Systems Review of Systems  Constitutional: Negative for chills, diaphoresis and fever.  HENT: Negative for ear pain and sore throat.   Eyes: Negative for pain and visual disturbance.  Respiratory: Negative for cough, chest tightness, shortness of breath and wheezing.   Cardiovascular: Negative for chest pain, palpitations and  leg swelling.  Gastrointestinal: Negative for abdominal pain and vomiting.  Genitourinary: Negative for dysuria and hematuria.  Musculoskeletal: Positive for arthralgias and gait problem. Negative for back pain.  Skin: Negative for color change and rash.  Neurological: Negative for seizures and syncope.  All other systems reviewed and are negative.    Physical Exam Updated Vital Signs BP 91/61   Pulse 92   Temp 98.8 F (37.1 C) (Oral)   Resp 16   SpO2 97%   Physical Exam  Constitutional: He appears well-developed and well-nourished. No distress.  HENT:  Head: Normocephalic and atraumatic.  Eyes: Pupils are equal, round, and reactive to light. Conjunctivae are normal.  Neck: Normal range of motion. Neck supple.  Cardiovascular: Normal rate, regular rhythm, normal heart sounds and  intact distal pulses.   No murmur heard. Pulmonary/Chest: Effort normal and breath sounds normal. No respiratory distress.  Abdominal: Soft. There is no tenderness.  Musculoskeletal: Normal range of motion. He exhibits no edema.       Left shoulder: He exhibits tenderness, deformity and pain. He exhibits normal range of motion.       Left elbow: Normal. He exhibits normal range of motion, no swelling, no effusion and no deformity.       Left wrist: Normal. He exhibits normal range of motion, no tenderness, no bony tenderness and no swelling.       Left upper arm: Normal. He exhibits no tenderness, no swelling and no deformity.       Left forearm: He exhibits no tenderness, no bony tenderness, no swelling and no deformity.       Left hand: He exhibits normal range of motion, no tenderness, no bony tenderness, normal capillary refill and no deformity. Normal sensation noted. Normal strength noted.  Left BKA. Left clavicular pain and shoulder deformity.  Neurological: He is alert.  Skin: Skin is warm and dry. Capillary refill takes less than 2 seconds. No rash noted. He is not diaphoretic. No erythema.  Psychiatric: He has a normal mood and affect.  Nursing note and vitals reviewed.    ED Treatments / Results  Labs (all labs ordered are listed, but only abnormal results are displayed) Labs Reviewed  COMPREHENSIVE METABOLIC PANEL - Abnormal; Notable for the following:       Result Value   Chloride 95 (*)    Glucose, Bld 261 (*)    Creatinine, Ser 4.06 (*)    Total Protein 8.9 (*)    Albumin 3.3 (*)    ALT 10 (*)    Alkaline Phosphatase 127 (*)    GFR calc non Af Amer 16 (*)    GFR calc Af Amer 19 (*)    All other components within normal limits  CBC WITH DIFFERENTIAL/PLATELET - Abnormal; Notable for the following:    RBC 3.27 (*)    Hemoglobin 11.3 (*)    HCT 34.3 (*)    MCV 104.9 (*)    MCH 34.6 (*)    All other components within normal limits  I-STAT CG4 LACTIC ACID, ED     EKG  EKG Interpretation None       Radiology Dg Clavicle Left  Result Date: 01/23/2017 CLINICAL DATA:  Patient lifted left arm 15 days ago and started having anterior left shoulder pain. EXAM: LEFT CLAVICLE - 2+ VIEWS COMPARISON:  None. FINDINGS: An acute, closed, slightly comminuted fracture of the medial left clavicle at the junction of the proximal and middle third is noted with 1/2 shaft width  caudal displacement and slight caudal angulation of the main distal fracture fragment. The included left ribs and lung are nonacute. The [facility] and glenohumeral joints appear intact. IMPRESSION: 1. An acute, closed, slightly comminuted fracture of the medial left clavicle at the junction of the proximal and middle third is noted with 1/2 shaft width caudal displacement and slight caudal angulation of the main distal fracture fragment. 2. Intact AC and glenohumeral joints. Electronically Signed   By: Ashley Royalty M.D.   On: 01/23/2017 15:51   Dg Shoulder Left  Result Date: 01/23/2017 CLINICAL DATA:  Left clavicular pain after lifting arm 15 days ago. Denies injury. EXAM: LEFT SHOULDER - 2+ VIEW COMPARISON:  None. FINDINGS: An acute, closed slightly comminuted fracture of the medial left clavicle at the junction of the proximal and middle third is noted with 1/2 shaft width caudal displacement and slight caudal angulation of the main distal fracture fragment. The [facility] and glenohumeral joints are maintained. Included left ribs and lung are nonacute. IMPRESSION: 1. Acute, closed, slightly comminuted fracture of the medial left clavicle at the junction of the proximal middle third with 1/2 shaft width caudal displacement and slight caudal angulation the main distal fracture fragment. 2. Intact AC and glenohumeral joints. Electronically Signed   By: Ashley Royalty M.D.   On: 01/23/2017 15:49    Procedures Procedures (including critical care time)  Medications Ordered in ED Medications  acetaminophen (TYLENOL)  tablet 650 mg (650 mg Oral Given 01/23/17 2221)  acetaminophen (TYLENOL) tablet 650 mg (650 mg Oral Given 01/24/17 0001)     Initial Impression / Assessment and Plan / ED Course  I have reviewed the triage vital signs and the nursing notes.  Pertinent labs & imaging results that were available during my care of the patient were reviewed by me and considered in my medical decision making (see chart for details).     This is a 46 year old male who presents with left shoulder pain which she states has occurred for the past 8 days.  Left shoulder and clavicular x-ray performed which showed intact ACL glenohumeral joints and acute closed slightly comminuted fracture of the medial left clavicle. No skin tenting noted on exam. LUE exam otherwise unremarkable with distal pulses intact, no sensory or motor deficits in the distal arm.  Patient given sling for comfort and instructed on range of motion exercises.  Patient states because he has a BKA and now sling on his left side, he was concerned about living at home with his friend in which he states he does have any himself. Spoke with case management who also spoke directly with the patient.  Patient does not have insurance, but we cannot provide wheelchair this evening from the ED.  Patient continues dialysis on TTS, arranged for case management to see him at dialysis.  All questions answered prior to discharge.  Patient given prescription for pain medications. [place] database researched prior to Rx assignment.  Final Clinical Impressions(s) / ED Diagnoses   Final diagnoses:  Closed displaced fracture of shaft of left clavicle, initial encounter    New Prescriptions New Prescriptions   HYDROCODONE-ACETAMINOPHEN (NORCO/VICODIN) 5-325 MG TABLET    Take 1 tablet by mouth every 6 (six) hours as needed for moderate pain.     Aldona Lento, MD 01/24/17 Adelfa Koh    Carmin Muskrat, MD 01/27/17 [phone]

## 2017-01-23 NOTE — Care Management (Signed)
ED CM consulted concerning possible HH needs. Patient presented tonight c/o left shoulder pain hx ESRD on HD T/Th/Sat, and  BKA .  Patient is uninsured and active with [facility]. That  Discussed with patient  ED CM will contact [facility] CM to follow up with patient for his discharge needs on Monday Clinic will be closed tomorrow due to inclement weather patient verbalizes understanding and teach back done.  Updated EDP on transitional care plan.

## 2017-01-24 MED ORDER — HYDROCODONE-ACETAMINOPHEN 5-325 MG PO TABS: 1.0000 | ORAL_TABLET | Freq: Four times a day (QID) | ORAL | 0 refills | Status: AC | PRN

## 2017-01-24 NOTE — ED Notes (Signed)
[facility] called  

## 2017-05-07 ENCOUNTER — Other Ambulatory Visit: Payer: Self-pay

## 2017-05-08 ENCOUNTER — Other Ambulatory Visit: Payer: Self-pay

## 2017-05-14 ENCOUNTER — Other Ambulatory Visit: Payer: Self-pay | Admitting: Infectious Diseases

## 2017-05-14 DIAGNOSIS — B20 Human immunodeficiency virus [HIV] disease: Secondary | ICD-10-CM

## 2017-05-15 ENCOUNTER — Other Ambulatory Visit: Payer: Self-pay

## 2017-05-15 DIAGNOSIS — B20 Human immunodeficiency virus [HIV] disease: Secondary | ICD-10-CM

## 2017-05-16 LAB — CBC WITH DIFFERENTIAL/PLATELET
Basophils Absolute: 62 cells/uL (ref 0–200)
Basophils Relative: 1.1 %
EOS ABS: 297 {cells}/uL (ref 15–500)
Eosinophils Relative: 5.3 %
HCT: 33.2 % — ABNORMAL LOW (ref 38.5–50.0)
Hemoglobin: 11.7 g/dL — ABNORMAL LOW (ref 13.2–17.1)
Lymphs Abs: 1344 cells/uL (ref 850–3900)
MCH: 34.7 pg — AB (ref 27.0–33.0)
MCHC: 35.2 g/dL (ref 32.0–36.0)
MCV: 98.5 fL (ref 80.0–100.0)
MPV: 9.6 fL (ref 7.5–12.5)
Monocytes Relative: 9.6 %
NEUTROS PCT: 60 %
Neutro Abs: 3360 cells/uL (ref 1500–7800)
PLATELETS: 195 10*3/uL (ref 140–400)
RBC: 3.37 10*6/uL — ABNORMAL LOW (ref 4.20–5.80)
RDW: 13.7 % (ref 11.0–15.0)
TOTAL LYMPHOCYTE: 24 %
WBC: 5.6 10*3/uL (ref 3.8–10.8)
WBCMIX: 538 {cells}/uL (ref 200–950)

## 2017-05-16 LAB — COMPLETE METABOLIC PANEL WITH GFR
AG RATIO: 1 (calc) (ref 1.0–2.5)
ALBUMIN MSPROF: 4.1 g/dL (ref 3.6–5.1)
ALKALINE PHOSPHATASE (APISO): 136 U/L — AB (ref 40–115)
ALT: 9 U/L (ref 9–46)
AST: 17 U/L (ref 10–40)
BUN/Creatinine Ratio: 3 (calc) — ABNORMAL LOW (ref 6–22)
BUN: 18 mg/dL (ref 7–25)
CO2: 30 mmol/L (ref 20–32)
CREATININE: 6.19 mg/dL — AB (ref 0.60–1.35)
Calcium: 8.5 mg/dL — ABNORMAL LOW (ref 8.6–10.3)
Chloride: 93 mmol/L — ABNORMAL LOW (ref 98–110)
GFR, EST NON AFRICAN AMERICAN: 10 mL/min/{1.73_m2} — AB (ref >=60)
GFR, Est African American: 11 mL/min/{1.73_m2} — ABNORMAL LOW (ref >=60)
Globulin: 4.1 g/dL (calc) — ABNORMAL HIGH (ref 1.9–3.7)
Glucose, Bld: 195 mg/dL — ABNORMAL HIGH (ref 65–99)
POTASSIUM: 3.3 mmol/L — AB (ref 3.5–5.3)
SODIUM: 134 mmol/L — AB (ref 135–146)
Total Bilirubin: 0.4 mg/dL (ref 0.2–1.2)
Total Protein: 8.2 g/dL — ABNORMAL HIGH (ref 6.1–8.1)

## 2017-05-16 LAB — RPR: RPR Ser Ql: NONREACTIVE

## 2017-05-16 LAB — T-HELPER CELL (CD4) - (RCID CLINIC ONLY)
CD4 % Helper T Cell: 18 % — ABNORMAL LOW (ref 33–55)
CD4 T CELL ABS: 320 /uL — AB (ref 400–2700)

## 2017-05-19 LAB — HIV-1 RNA QUANT-NO REFLEX-BLD
HIV 1 RNA Quant: <20 copies/mL
HIV-1 RNA QUANT, LOG: NOT DETECTED {Log_copies}/mL

## 2017-05-20 ENCOUNTER — Other Ambulatory Visit: Payer: Self-pay

## 2017-05-20 ENCOUNTER — Ambulatory Visit (INDEPENDENT_AMBULATORY_CARE_PROVIDER_SITE_OTHER): Payer: Self-pay | Admitting: Internal Medicine

## 2017-05-20 ENCOUNTER — Encounter: Payer: Self-pay | Admitting: Internal Medicine

## 2017-05-20 VITALS — BP 97/60 | HR 99 | Temp 98.7°F | Wt 113.0 lb

## 2017-05-20 DIAGNOSIS — B20 Human immunodeficiency virus [HIV] disease: Secondary | ICD-10-CM

## 2017-05-20 DIAGNOSIS — Z113 Encounter for screening for infections with a predominantly sexual mode of transmission: Secondary | ICD-10-CM

## 2017-05-20 DIAGNOSIS — B182 Chronic viral hepatitis C: Secondary | ICD-10-CM

## 2017-05-21 DIAGNOSIS — Z113 Encounter for screening for infections with a predominantly sexual mode of transmission: Secondary | ICD-10-CM | POA: Insufficient documentation

## 2017-05-21 NOTE — Assessment & Plan Note (Signed)
Doing well.  No changes and rtc 6 months.

## 2017-05-21 NOTE — Assessment & Plan Note (Signed)
Treated, will check SVR24 next visit

## 2017-05-21 NOTE — Assessment & Plan Note (Signed)
Screened negative 

## 2017-05-21 NOTE — Progress Notes (Signed)
   Subjective:    Patient ID: Alvin Daniels, male    DOB: 1970/10/15, 47 y.o.   MRN: [id number]  HPI Here for follow up of hiv Has been on Biktarvy and denies any missed doses. CD4 of 320 and viral load < 20.  No associated nv/d.  No rashes.  No significant complaints today.     Review of Systems  Constitutional: Negative for fatigue.  Gastrointestinal: Negative for diarrhea.  Neurological: Negative for dizziness.       Objective:   Physical Exam  Constitutional: He appears well-developed and well-nourished. No distress.  HENT:  Mouth/Throat: No oropharyngeal exudate.  Eyes: No scleral icterus.  Cardiovascular: Normal rate, regular rhythm and normal heart sounds.  No murmur heard. Pulmonary/Chest: Effort normal and breath sounds normal. No respiratory distress.  Lymphadenopathy:    He has no cervical adenopathy.   SH: no tobacco       Assessment & Plan:

## 2017-06-24 ENCOUNTER — Other Ambulatory Visit: Payer: Self-pay | Admitting: Infectious Diseases

## 2017-06-24 DIAGNOSIS — B20 Human immunodeficiency virus [HIV] disease: Secondary | ICD-10-CM

## 2017-06-25 ENCOUNTER — Ambulatory Visit: Payer: Self-pay | Admitting: Internal Medicine

## 2017-06-30 ENCOUNTER — Ambulatory Visit: Payer: Self-pay | Admitting: Family

## 2017-07-11 ENCOUNTER — Encounter: Payer: Self-pay | Admitting: Internal Medicine

## 2017-08-18 ENCOUNTER — Ambulatory Visit: Payer: Self-pay | Admitting: Infectious Diseases

## 2017-09-02 ENCOUNTER — Other Ambulatory Visit: Payer: Self-pay

## 2017-09-02 ENCOUNTER — Other Ambulatory Visit: Payer: Self-pay | Admitting: Infectious Disease

## 2017-09-02 DIAGNOSIS — B20 Human immunodeficiency virus [HIV] disease: Secondary | ICD-10-CM

## 2017-09-23 DIAGNOSIS — L0202 Furuncle of face: Secondary | ICD-10-CM | POA: Insufficient documentation

## 2017-11-03 ENCOUNTER — Other Ambulatory Visit: Payer: Self-pay

## 2017-11-17 ENCOUNTER — Encounter: Payer: Self-pay | Admitting: Internal Medicine

## 2017-11-18 ENCOUNTER — Other Ambulatory Visit: Payer: Self-pay | Admitting: Infectious Disease

## 2017-11-18 DIAGNOSIS — B20 Human immunodeficiency virus [HIV] disease: Secondary | ICD-10-CM

## 2017-11-19 ENCOUNTER — Ambulatory Visit (INDEPENDENT_AMBULATORY_CARE_PROVIDER_SITE_OTHER): Payer: Self-pay | Admitting: Internal Medicine

## 2017-11-19 ENCOUNTER — Other Ambulatory Visit: Payer: Self-pay

## 2017-11-19 ENCOUNTER — Encounter: Payer: Self-pay | Admitting: Internal Medicine

## 2017-11-19 VITALS — BP 106/66 | HR 91 | Wt 112.0 lb

## 2017-11-19 DIAGNOSIS — Z89512 Acquired absence of left leg below knee: Secondary | ICD-10-CM

## 2017-11-19 DIAGNOSIS — E1122 Type 2 diabetes mellitus with diabetic chronic kidney disease: Secondary | ICD-10-CM

## 2017-11-19 DIAGNOSIS — B182 Chronic viral hepatitis C: Secondary | ICD-10-CM

## 2017-11-19 DIAGNOSIS — Z21 Asymptomatic human immunodeficiency virus [HIV] infection status: Secondary | ICD-10-CM

## 2017-11-19 DIAGNOSIS — N186 End stage renal disease: Secondary | ICD-10-CM

## 2017-11-19 DIAGNOSIS — L97509 Non-pressure chronic ulcer of other part of unspecified foot with unspecified severity: Secondary | ICD-10-CM

## 2017-11-19 DIAGNOSIS — E11621 Type 2 diabetes mellitus with foot ulcer: Secondary | ICD-10-CM | POA: Insufficient documentation

## 2017-11-19 DIAGNOSIS — B20 Human immunodeficiency virus [HIV] disease: Secondary | ICD-10-CM

## 2017-11-19 DIAGNOSIS — L89891 Pressure ulcer of other site, stage 1: Secondary | ICD-10-CM

## 2017-11-19 MED ORDER — "SILVERSEAL HYDROGEL DRESSING 2""X3"" EX PADS": 1.0000 "application " | MEDICATED_PAD | Freq: Once | CUTANEOUS | 0 refills | Status: AC

## 2017-11-19 MED ORDER — BICTEGRAVIR-EMTRICITAB-TENOFOV 50-200-25 MG PO TABS: 1.0000 | ORAL_TABLET | Freq: Every day | ORAL | 11 refills | Status: DC

## 2017-11-19 NOTE — Assessment & Plan Note (Signed)
Will look into options for prosthetics but I am not aware of any services.

## 2017-11-19 NOTE — Assessment & Plan Note (Signed)
I sent silvadene to his pharmacy though I doubt it is covered but he can apply and see if that helps.

## 2017-11-19 NOTE — Progress Notes (Signed)
   Subjective:    Patient ID: Alvin Daniels, male    DOB: Jun 11, 1970, 47 y.o.   MRN: [id number]  HPI Here for follow up of HIV Continues on [place] and denies any missed doses.  No new issues.  No associated n/v/d.  Asking about prosthetic leg.  Has a persistent wound on his right foot.     Review of Systems  Constitutional: Negative for fatigue.  Skin: Negative for rash.  Neurological: Negative for dizziness.       Objective:   Physical Exam  Constitutional:  Thin but well-appearing  Eyes: No scleral icterus.  Cardiovascular: Normal rate, regular rhythm and normal heart sounds.  Pulmonary/Chest: Effort normal and breath sounds normal. No respiratory distress.  Musculoskeletal:  Right foot with stage 1 ulcer, some mild bleeding, no surrounding erythema, no pus  Skin: No rash noted.    SH: no tobacco      Assessment & Plan:

## 2017-11-20 LAB — RPR: RPR: NONREACTIVE

## 2017-11-20 LAB — T-HELPER CELL (CD4) - (RCID CLINIC ONLY)
CD4 T CELL HELPER: 19 % — AB (ref 33–55)
CD4 T Cell Abs: 270 /uL — ABNORMAL LOW (ref 400–2700)

## 2017-11-20 LAB — HEMOGLOBIN A1C
Hgb A1c MFr Bld: 13.4 % of total Hgb — ABNORMAL HIGH (ref <5.7)
Mean Plasma Glucose: 338 (calc)
eAG (mmol/L): 18.7 (calc)

## 2017-11-21 LAB — HIV-1 RNA QUANT-NO REFLEX-BLD
HIV 1 RNA Quant: <20 copies/mL
HIV-1 RNA Quant, Log: <1.3 Log copies/mL

## 2017-12-04 ENCOUNTER — Encounter: Payer: Self-pay | Admitting: Internal Medicine

## 2018-02-23 ENCOUNTER — Telehealth: Payer: Self-pay | Admitting: *Deleted

## 2018-02-23 NOTE — Telephone Encounter (Signed)
Patient here to see Audelia Hives. Asked for referral for prosthetic evaluation to  [facility]  [phone] Leon called, patient is scheduled for initial consult 10/21 at 1:00. He needs to bring photo ID and an interpreter.  He will not have to bring money to initial consult, may have to pay at some point, but they are unsure of cost at this time. Appointment information explained to and given to patient via Audelia Hives. Landis Gandy, RN

## 2018-04-06 IMAGING — CR DG CLAVICLE*L*
2 series · 2 of 2 positions shown · non-contrast
Comparison: None.

CLINICAL DATA: Patient lifted left arm 15 days ago and started
having anterior left shoulder pain.

EXAM:
LEFT CLAVICLE - 2+ VIEWS

[clavicle ap]
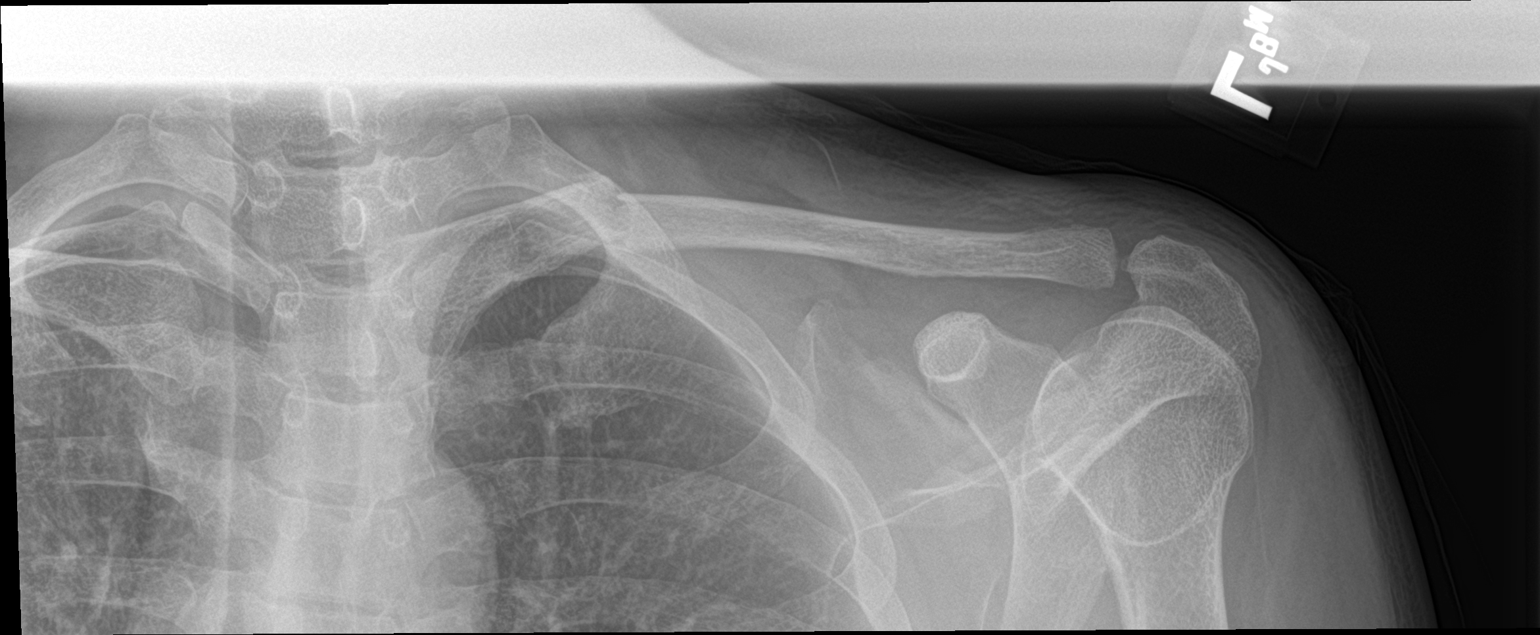

[clavicle axial]
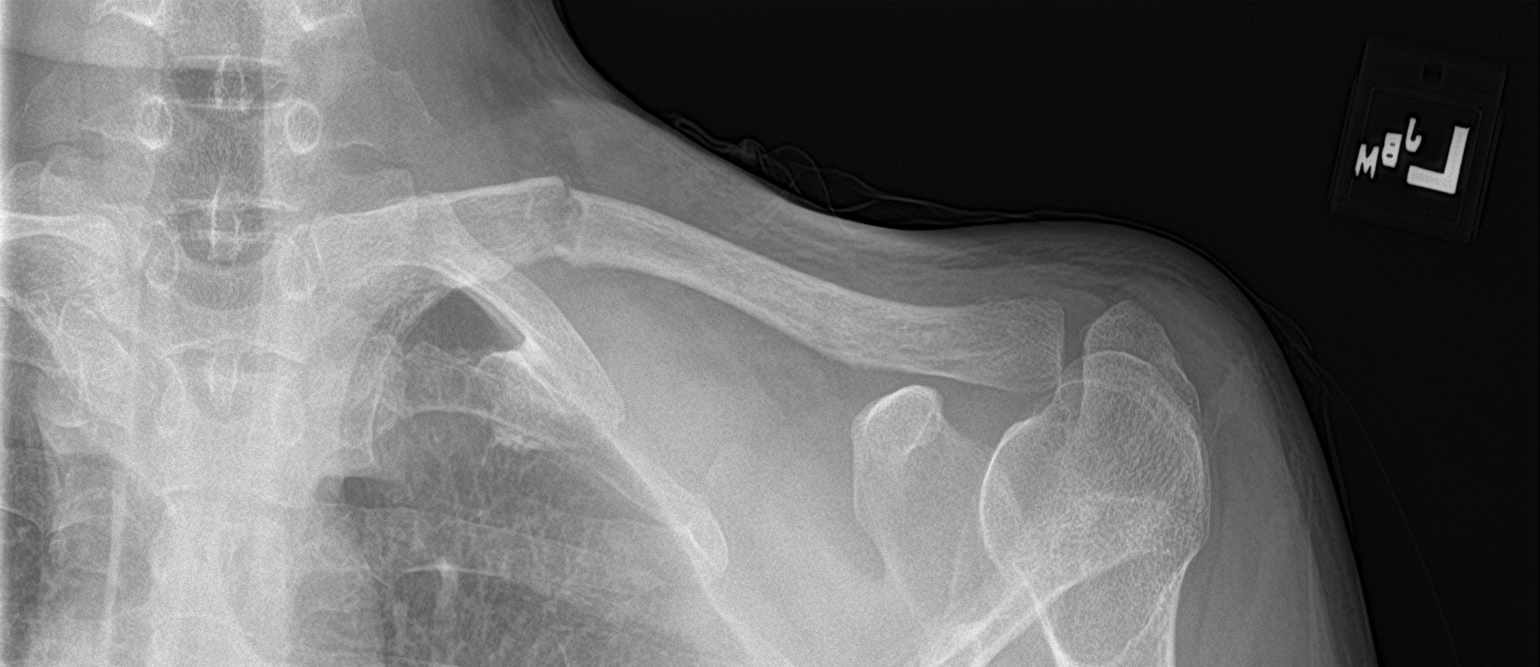

[2 of 2 positions shown; findings below may reference images not displayed]

FINDINGS: An acute, closed, slightly comminuted fracture of the medial left
clavicle at the junction of the proximal and middle third is noted
with [DATE] shaft width caudal displacement and slight caudal
angulation of the main distal fracture fragment. The included left
ribs and lung are nonacute. The AC and glenohumeral joints appear
intact.
IMPRESSION: 1. An acute, closed, slightly comminuted fracture of the medial left
clavicle at the junction of the proximal and middle third is noted
with [DATE] shaft width caudal displacement and slight caudal
angulation of the main distal fracture fragment.
2. Intact AC and glenohumeral joints.

## 2018-05-26 ENCOUNTER — Ambulatory Visit: Payer: Self-pay | Admitting: Internal Medicine

## 2018-05-27 ENCOUNTER — Ambulatory Visit: Payer: Self-pay | Admitting: Internal Medicine

## 2018-05-28 ENCOUNTER — Encounter: Payer: Self-pay | Admitting: Internal Medicine

## 2018-06-16 ENCOUNTER — Encounter (HOSPITAL_COMMUNITY): Payer: Self-pay

## 2018-06-16 ENCOUNTER — Emergency Department (HOSPITAL_COMMUNITY): Payer: Medicaid Other

## 2018-06-16 ENCOUNTER — Inpatient Hospital Stay (HOSPITAL_COMMUNITY)
Admission: EM | Admit: 2018-06-16 | Discharge: 2018-06-23 | DRG: 974 | Disposition: A | Discharge status: Home / Self Care | Payer: Medicaid Other | Attending: Family Medicine | Admitting: Family Medicine

## 2018-06-16 DIAGNOSIS — B999 Unspecified infectious disease: Secondary | ICD-10-CM

## 2018-06-16 DIAGNOSIS — Z89512 Acquired absence of left leg below knee: Secondary | ICD-10-CM

## 2018-06-16 DIAGNOSIS — K219 Gastro-esophageal reflux disease without esophagitis: Secondary | ICD-10-CM | POA: Diagnosis present

## 2018-06-16 DIAGNOSIS — Z79899 Other long term (current) drug therapy: Secondary | ICD-10-CM

## 2018-06-16 DIAGNOSIS — N186 End stage renal disease: Secondary | ICD-10-CM

## 2018-06-16 DIAGNOSIS — L97419 Non-pressure chronic ulcer of right heel and midfoot with unspecified severity: Secondary | ICD-10-CM | POA: Diagnosis present

## 2018-06-16 DIAGNOSIS — R6521 Severe sepsis with septic shock: Secondary | ICD-10-CM | POA: Diagnosis present

## 2018-06-16 DIAGNOSIS — N2581 Secondary hyperparathyroidism of renal origin: Secondary | ICD-10-CM | POA: Diagnosis present

## 2018-06-16 DIAGNOSIS — Z992 Dependence on renal dialysis: Secondary | ICD-10-CM

## 2018-06-16 DIAGNOSIS — E11621 Type 2 diabetes mellitus with foot ulcer: Secondary | ICD-10-CM | POA: Diagnosis present

## 2018-06-16 DIAGNOSIS — B20 Human immunodeficiency virus [HIV] disease: Secondary | ICD-10-CM | POA: Diagnosis present

## 2018-06-16 DIAGNOSIS — Z21 Asymptomatic human immunodeficiency virus [HIV] infection status: Secondary | ICD-10-CM | POA: Diagnosis present

## 2018-06-16 DIAGNOSIS — E1140 Type 2 diabetes mellitus with diabetic neuropathy, unspecified: Secondary | ICD-10-CM | POA: Diagnosis present

## 2018-06-16 DIAGNOSIS — I9589 Other hypotension: Secondary | ICD-10-CM | POA: Diagnosis present

## 2018-06-16 DIAGNOSIS — E1165 Type 2 diabetes mellitus with hyperglycemia: Secondary | ICD-10-CM | POA: Diagnosis present

## 2018-06-16 DIAGNOSIS — A419 Sepsis, unspecified organism: Secondary | ICD-10-CM

## 2018-06-16 DIAGNOSIS — I959 Hypotension, unspecified: Secondary | ICD-10-CM | POA: Diagnosis present

## 2018-06-16 DIAGNOSIS — Z833 Family history of diabetes mellitus: Secondary | ICD-10-CM

## 2018-06-16 DIAGNOSIS — B182 Chronic viral hepatitis C: Secondary | ICD-10-CM | POA: Diagnosis present

## 2018-06-16 DIAGNOSIS — E1122 Type 2 diabetes mellitus with diabetic chronic kidney disease: Secondary | ICD-10-CM | POA: Diagnosis present

## 2018-06-16 DIAGNOSIS — E1142 Type 2 diabetes mellitus with diabetic polyneuropathy: Secondary | ICD-10-CM | POA: Diagnosis present

## 2018-06-16 DIAGNOSIS — R7881 Bacteremia: Secondary | ICD-10-CM

## 2018-06-16 DIAGNOSIS — D631 Anemia in chronic kidney disease: Secondary | ICD-10-CM | POA: Diagnosis present

## 2018-06-16 DIAGNOSIS — E8889 Other specified metabolic disorders: Secondary | ICD-10-CM | POA: Diagnosis present

## 2018-06-16 DIAGNOSIS — B9562 Methicillin resistant Staphylococcus aureus infection as the cause of diseases classified elsewhere: Secondary | ICD-10-CM

## 2018-06-16 DIAGNOSIS — A4102 Sepsis due to Methicillin resistant Staphylococcus aureus: Principal | ICD-10-CM | POA: Diagnosis present

## 2018-06-16 DIAGNOSIS — Z8614 Personal history of Methicillin resistant Staphylococcus aureus infection: Secondary | ICD-10-CM

## 2018-06-16 DIAGNOSIS — L97509 Non-pressure chronic ulcer of other part of unspecified foot with unspecified severity: Secondary | ICD-10-CM

## 2018-06-16 LAB — BASIC METABOLIC PANEL WITH GFR
Anion gap: 14 (ref 5–15)
BUN: 27 mg/dL — ABNORMAL HIGH (ref 6–20)
CO2: 30 mmol/L (ref 22–32)
Calcium: 8.2 mg/dL — ABNORMAL LOW (ref 8.9–10.3)
Chloride: 91 mmol/L — ABNORMAL LOW (ref 98–111)
Creatinine, Ser: 7.43 mg/dL — ABNORMAL HIGH (ref 0.61–1.24)
GFR calc Af Amer: 9 mL/min — ABNORMAL LOW
GFR calc non Af Amer: 8 mL/min — ABNORMAL LOW
Glucose, Bld: 152 mg/dL — ABNORMAL HIGH (ref 70–99)
Potassium: 3.9 mmol/L (ref 3.5–5.1)
Sodium: 135 mmol/L (ref 135–145)

## 2018-06-16 LAB — CBC
HCT: 33.1 % — ABNORMAL LOW (ref 39.0–52.0)
Hemoglobin: 10.5 g/dL — ABNORMAL LOW (ref 13.0–17.0)
MCH: 31.9 pg (ref 26.0–34.0)
MCHC: 31.7 g/dL (ref 30.0–36.0)
MCV: 100.6 fL — ABNORMAL HIGH (ref 80.0–100.0)
Platelets: 189 K/uL (ref 150–400)
RBC: 3.29 MIL/uL — ABNORMAL LOW (ref 4.22–5.81)
RDW: 13.9 % (ref 11.5–15.5)
WBC: 9.2 K/uL (ref 4.0–10.5)
nRBC: 0 % (ref 0.0–0.2)

## 2018-06-16 LAB — I-STAT TROPONIN, ED: Troponin i, poc: 0.02 ng/mL (ref 0.00–0.08)

## 2018-06-16 NOTE — ED Triage Notes (Signed)
Pt arrives with complaints of chest pain and generalized body aches after dialysis this morning (Tuesday, Thursday, Saturday). Denies any shortness of breath.

## 2018-06-17 ENCOUNTER — Other Ambulatory Visit: Payer: Self-pay

## 2018-06-17 DIAGNOSIS — D631 Anemia in chronic kidney disease: Secondary | ICD-10-CM | POA: Diagnosis not present

## 2018-06-17 DIAGNOSIS — B182 Chronic viral hepatitis C: Secondary | ICD-10-CM | POA: Diagnosis not present

## 2018-06-17 DIAGNOSIS — E11621 Type 2 diabetes mellitus with foot ulcer: Secondary | ICD-10-CM | POA: Diagnosis not present

## 2018-06-17 DIAGNOSIS — R6521 Severe sepsis with septic shock: Secondary | ICD-10-CM | POA: Diagnosis not present

## 2018-06-17 DIAGNOSIS — E1122 Type 2 diabetes mellitus with diabetic chronic kidney disease: Secondary | ICD-10-CM | POA: Diagnosis not present

## 2018-06-17 DIAGNOSIS — N186 End stage renal disease: Secondary | ICD-10-CM | POA: Diagnosis present

## 2018-06-17 DIAGNOSIS — Z89512 Acquired absence of left leg below knee: Secondary | ICD-10-CM | POA: Diagnosis not present

## 2018-06-17 DIAGNOSIS — A419 Sepsis, unspecified organism: Secondary | ICD-10-CM

## 2018-06-17 DIAGNOSIS — Z8614 Personal history of Methicillin resistant Staphylococcus aureus infection: Secondary | ICD-10-CM | POA: Diagnosis not present

## 2018-06-17 DIAGNOSIS — Z79899 Other long term (current) drug therapy: Secondary | ICD-10-CM | POA: Diagnosis not present

## 2018-06-17 DIAGNOSIS — L97419 Non-pressure chronic ulcer of right heel and midfoot with unspecified severity: Secondary | ICD-10-CM | POA: Diagnosis not present

## 2018-06-17 DIAGNOSIS — A4102 Sepsis due to Methicillin resistant Staphylococcus aureus: Secondary | ICD-10-CM | POA: Diagnosis not present

## 2018-06-17 DIAGNOSIS — K219 Gastro-esophageal reflux disease without esophagitis: Secondary | ICD-10-CM | POA: Diagnosis not present

## 2018-06-17 DIAGNOSIS — I9589 Other hypotension: Secondary | ICD-10-CM | POA: Diagnosis not present

## 2018-06-17 DIAGNOSIS — E8889 Other specified metabolic disorders: Secondary | ICD-10-CM | POA: Diagnosis not present

## 2018-06-17 DIAGNOSIS — Z833 Family history of diabetes mellitus: Secondary | ICD-10-CM | POA: Diagnosis not present

## 2018-06-17 DIAGNOSIS — N2581 Secondary hyperparathyroidism of renal origin: Secondary | ICD-10-CM | POA: Diagnosis not present

## 2018-06-17 DIAGNOSIS — E1140 Type 2 diabetes mellitus with diabetic neuropathy, unspecified: Secondary | ICD-10-CM | POA: Diagnosis not present

## 2018-06-17 DIAGNOSIS — B20 Human immunodeficiency virus [HIV] disease: Secondary | ICD-10-CM | POA: Diagnosis not present

## 2018-06-17 DIAGNOSIS — Z992 Dependence on renal dialysis: Secondary | ICD-10-CM | POA: Diagnosis not present

## 2018-06-17 DIAGNOSIS — E1165 Type 2 diabetes mellitus with hyperglycemia: Secondary | ICD-10-CM | POA: Diagnosis not present

## 2018-06-17 DIAGNOSIS — I959 Hypotension, unspecified: Secondary | ICD-10-CM

## 2018-06-17 DIAGNOSIS — E1142 Type 2 diabetes mellitus with diabetic polyneuropathy: Secondary | ICD-10-CM | POA: Diagnosis not present

## 2018-06-17 LAB — TROPONIN I
Troponin I: <0.03 ng/mL (ref <0.03)
Troponin I: <0.03 ng/mL (ref <0.03)

## 2018-06-17 LAB — DIFFERENTIAL
Basophils Absolute: 0 10*3/uL (ref 0.0–0.1)
Basophils Relative: 1 %
EOS ABS: 0.1 10*3/uL (ref 0.0–0.5)
Eosinophils Relative: 2 %
Lymphocytes Relative: 11 %
Lymphs Abs: 1 10*3/uL (ref 0.7–4.0)
Monocytes Absolute: 0.6 10*3/uL (ref 0.1–1.0)
Monocytes Relative: 7 %
Neutro Abs: 7 10*3/uL (ref 1.7–7.7)
Neutrophils Relative %: 80 %

## 2018-06-17 LAB — HEPATIC FUNCTION PANEL
ALT: 13 U/L (ref 0–44)
AST: 25 U/L (ref 15–41)
Albumin: 3.2 g/dL — ABNORMAL LOW (ref 3.5–5.0)
Alkaline Phosphatase: 76 U/L (ref 38–126)
BILIRUBIN DIRECT: 0.1 mg/dL (ref 0.0–0.2)
Indirect Bilirubin: 0.5 mg/dL (ref 0.3–0.9)
Total Bilirubin: 0.6 mg/dL (ref 0.3–1.2)
Total Protein: 8.9 g/dL — ABNORMAL HIGH (ref 6.5–8.1)

## 2018-06-17 LAB — LACTIC ACID, PLASMA
Lactic Acid, Venous: 2.9 mmol/L (ref 0.5–1.9)
Lactic Acid, Venous: 0.8 mmol/L (ref 0.5–1.9)

## 2018-06-17 LAB — CBG MONITORING, ED: Glucose-Capillary: 356 mg/dL — ABNORMAL HIGH (ref 70–99)

## 2018-06-17 LAB — INFLUENZA PANEL BY PCR (TYPE A & B)
Influenza A By PCR: NEGATIVE
Influenza B By PCR: NEGATIVE

## 2018-06-17 LAB — GLUCOSE, CAPILLARY
GLUCOSE-CAPILLARY: 78 mg/dL (ref 70–99)
Glucose-Capillary: 310 mg/dL — ABNORMAL HIGH (ref 70–99)
Glucose-Capillary: 99 mg/dL (ref 70–99)

## 2018-06-17 MED ORDER — INSULIN ASPART 100 UNIT/ML ~~LOC~~ SOLN
0.0000 [IU] | Freq: Every day | SUBCUTANEOUS | Status: DC
  Administered 2018-06-18: 3 [IU] via SUBCUTANEOUS
  Administered 2018-06-19: 4 [IU] via SUBCUTANEOUS
  Administered 2018-06-20: 2 [IU] via SUBCUTANEOUS
  Administered 2018-06-21: 3 [IU] via SUBCUTANEOUS

## 2018-06-17 MED ORDER — BICTEGRAVIR-EMTRICITAB-TENOFOV 50-200-25 MG PO TABS
1.0000 | ORAL_TABLET | Freq: Every day | ORAL | Status: DC
  Administered 2018-06-17 – 2018-06-23 (×7): 1 via ORAL
  Filled 2018-06-17 (×7): qty 1

## 2018-06-17 MED ORDER — MIDODRINE HCL 5 MG PO TABS
5.0000 mg | ORAL_TABLET | Freq: Three times a day (TID) | ORAL | Status: DC
  Administered 2018-06-17 – 2018-06-23 (×20): 5 mg via ORAL
  Filled 2018-06-17 (×19): qty 1

## 2018-06-17 MED ORDER — TRAMADOL HCL 50 MG PO TABS
50.0000 mg | ORAL_TABLET | Freq: Once | ORAL | Status: AC | PRN
  Administered 2018-06-17: 50 mg via ORAL
  Filled 2018-06-17: qty 1

## 2018-06-17 MED ORDER — ACETAMINOPHEN 325 MG PO TABS
650.0000 mg | ORAL_TABLET | Freq: Once | ORAL | Status: AC
  Administered 2018-06-17: 650 mg via ORAL
  Filled 2018-06-17: qty 2

## 2018-06-17 MED ORDER — SODIUM CHLORIDE 0.9 % IV BOLUS
1000.0000 mL | Freq: Once | INTRAVENOUS | Status: AC
  Administered 2018-06-17: 1000 mL via INTRAVENOUS

## 2018-06-17 MED ORDER — SODIUM CHLORIDE 0.9 % IV BOLUS
500.0000 mL | Freq: Once | INTRAVENOUS | Status: AC
  Administered 2018-06-17: 500 mL via INTRAVENOUS

## 2018-06-17 MED ORDER — VANCOMYCIN VARIABLE DOSE PER UNSTABLE RENAL FUNCTION (PHARMACIST DOSING): Status: DC

## 2018-06-17 MED ORDER — INSULIN ASPART 100 UNIT/ML ~~LOC~~ SOLN
0.0000 [IU] | Freq: Three times a day (TID) | SUBCUTANEOUS | Status: DC
  Administered 2018-06-17: 15 [IU] via SUBCUTANEOUS
  Administered 2018-06-18: 3 [IU] via SUBCUTANEOUS
  Administered 2018-06-18: 2 [IU] via SUBCUTANEOUS
  Administered 2018-06-19: 5 [IU] via SUBCUTANEOUS
  Administered 2018-06-19: 8 [IU] via SUBCUTANEOUS
  Administered 2018-06-19 – 2018-06-20 (×2): 5 [IU] via SUBCUTANEOUS
  Administered 2018-06-20 – 2018-06-21 (×2): 8 [IU] via SUBCUTANEOUS
  Administered 2018-06-21: 15 [IU] via SUBCUTANEOUS
  Administered 2018-06-21: 11 [IU] via SUBCUTANEOUS
  Administered 2018-06-22: 5 [IU] via SUBCUTANEOUS
  Administered 2018-06-22: 3 [IU] via SUBCUTANEOUS
  Administered 2018-06-22: 11 [IU] via SUBCUTANEOUS
  Administered 2018-06-23: 3 [IU] via SUBCUTANEOUS
  Filled 2018-06-17: qty 1

## 2018-06-17 MED ORDER — DOXERCALCIFEROL 4 MCG/2ML IV SOLN
3.0000 ug | INTRAVENOUS | Status: DC
  Administered 2018-06-18 – 2018-06-23 (×3): 3 ug via INTRAVENOUS
  Filled 2018-06-17 (×2): qty 2

## 2018-06-17 MED ORDER — VANCOMYCIN HCL IN DEXTROSE 1-5 GM/200ML-% IV SOLN: 1000.0000 mg | Freq: Once | INTRAVENOUS | Status: DC

## 2018-06-17 MED ORDER — HEPARIN SODIUM (PORCINE) 5000 UNIT/ML IJ SOLN
5000.0000 [IU] | Freq: Three times a day (TID) | INTRAMUSCULAR | Status: DC
  Administered 2018-06-17 – 2018-06-23 (×18): 5000 [IU] via SUBCUTANEOUS
  Filled 2018-06-17 (×19): qty 1

## 2018-06-17 MED ORDER — VANCOMYCIN HCL 10 G IV SOLR
1250.0000 mg | Freq: Once | INTRAVENOUS | Status: AC
  Administered 2018-06-17: 1250 mg via INTRAVENOUS
  Filled 2018-06-17: qty 1250

## 2018-06-17 MED ORDER — CHLORHEXIDINE GLUCONATE CLOTH 2 % EX PADS: 6.0000 | MEDICATED_PAD | Freq: Every day | CUTANEOUS | Status: DC

## 2018-06-17 MED ORDER — SODIUM CHLORIDE 0.9 % IV SOLN
1.0000 g | INTRAVENOUS | Status: DC
  Administered 2018-06-18: 1 g via INTRAVENOUS
  Filled 2018-06-17: qty 1

## 2018-06-17 MED ORDER — SODIUM CHLORIDE 0.9 % IV SOLN
2.0000 g | Freq: Once | INTRAVENOUS | Status: AC
  Administered 2018-06-17: 2 g via INTRAVENOUS
  Filled 2018-06-17: qty 2

## 2018-06-17 MED ORDER — METRONIDAZOLE IN NACL 5-0.79 MG/ML-% IV SOLN
500.0000 mg | Freq: Three times a day (TID) | INTRAVENOUS | Status: DC
  Administered 2018-06-17 (×3): 500 mg via INTRAVENOUS
  Filled 2018-06-17 (×4): qty 100

## 2018-06-17 NOTE — ED Notes (Signed)
Pt aware urine specimen is needed. 

## 2018-06-17 NOTE — ED Provider Notes (Addendum)
[facility] DEPT Provider Note   CSN: [id number] Arrival date & time: 06/16/18  2210     History   Chief Complaint Chief Complaint  Patient presents with  . Chest Pain    HPI Alvin Daniels is a 48 y.o. male presents with chest pain and low back pain.  Past medical history significant for HIV (CD4 count was 270 in July with undetectable viral load) , ESRD on dialysis Tuesday, Thursday, Saturday, hep C, diabetes, left BKA.  The patient is with a friend at bedside.  He states that he has felt unwell for the past 2 days.  He reports chills, body aches, malaise, fatigue.  He has been having mild right-sided chest pain which is been gradually worsening with an associated dry cough.  He went to dialysis today and completed the entire session but his chest pain worsened so he decided to come to the ED.  He denies rhinorrhea, sore throat, headache, shortness of breath, abdominal pain, nausea, vomiting, diarrhea.  He makes very little urine.  He has a chronic ulcer on his right heel which he has had for years.  He's also been having low back pain. He has had a flu shot this year.  HPI  Past Medical History:  Diagnosis Date  . AIDS ([place]) 11/22/2014  . Chronic diarrhea   . Chronic hepatitis C without hepatic coma ([place]) 11/22/2014  . Diabetic neuropathy ([place])   . ESRD (end stage renal disease) on dialysis [facility])    "TTS; don't remember street name" (05/03/2014)  . Hepatitis C   . HIV INFECTION 06/27/2010   Qualifier: Diagnosis of  By: Nickola Major CMA ( [place]), Geni Bers    . Hypotension 06/02/2012  . Metabolic bone disease 10/13/1306  . MRSA infection   . Normocytic anemia 06/17/2012  . Pancreatitis   . Pressure ulcer of BKA stump, stage 2 ([place]) 11/22/2014  . Renal disorder   . Severe protein-calorie malnutrition ([place]) 06/17/2012  . Uncontrolled diabetes mellitus with complications ([place]) 6/57/8469   Annotation: uncontrolled Qualifier: Diagnosis of  By: Nickola Major  CMA Deborra Medina), Jacqueline      Patient Active Problem List   Diagnosis Date Noted  . Pressure ulcer of right foot 11/19/2017  . Screening examination for venereal disease 05/21/2017  . Femur fracture, left ([place]) 08/16/2016  . HIV (human immunodeficiency virus infection) ([place]) 08/16/2016  . GERD (gastroesophageal reflux disease) 05/24/2016  . Burn 04/10/2016  . Pressure ulcer of BKA stump, stage 2 ([place]) 11/22/2014  . Chronic hepatitis C without hepatic coma ([place]) 11/22/2014  . Below knee amputation status 06/17/2014  . Poor dentition 10/25/2013  . ESRD on dialysis ([place]) 10/25/2013  . Vision changes 06/21/2013  . Orthostatic hypotension 04/15/2013  . Orthostatic headache 04/15/2013  . Anemia of chronic renal failure 06/19/2012  . Metabolic bone disease 62/95/2841  . Chronic diarrhea 06/17/2012  . Normocytic anemia 06/17/2012  . Syncope and collapse 06/02/2012  . Hypotension 06/02/2012  . Polyneuropathy in diabetes(357.2) 06/27/2010  . Diabetes mellitus with ESRD (end-stage renal disease) ([place]) 01/03/2010    Past Surgical History:  Procedure Laterality Date  . AMPUTATION Left 04/20/2014   Procedure: 3rd toe amputation, 4th Toe Amputation,  5th Toe Amputation;  Surgeon: Newt Minion, MD;  Location: [facility];  Service: Orthopedics;  Laterality: Left;  . AMPUTATION Left 05/02/2014   Procedure: Midfoot Amputation;  Surgeon: Newt Minion, MD;  Location: [facility];  Service: Orthopedics;  Laterality: Left;  . AMPUTATION Left 06/17/2014  Procedure: AMPUTATION BELOW KNEE;  Surgeon: Newt Minion, MD;  Location: [facility];  Service: Orthopedics;  Laterality: Left;  . AV FISTULA PLACEMENT Left   . AV FISTULA PLACEMENT Left 05/10/2016   Procedure: Creation Left Arm Brachiocephalic Arteriovenous Fistula and Ligation of Radiocephalic Fistula;  Surgeon: Angelia Mould, MD;  Location: [facility];  Service: Vascular;  Laterality: Left;  . FEMUR IM NAIL Left 08/17/2016   Procedure: INTRAMEDULLARY (IM)  RETROGRADE FEMORAL NAILING;  Surgeon: Marybelle Killings, MD;  Location: [facility];  Service: Orthopedics;  Laterality: Left;  . FOOT AMPUTATION THROUGH ANKLE Left 12/'21/2015   midfoot  . IR GENERIC HISTORICAL Left 05/01/2016   IR THROMBECTOMY AV FISTULA W/THROMBOLYSIS/PTA INC/SHUNT/IMG LEFT 05/01/2016 [name], MD [facility]  . IR GENERIC HISTORICAL  05/01/2016   IR US GUIDE VASC ACCESS LEFT 05/01/2016 [name], MD [facility]  . IR GENERIC HISTORICAL  05/07/2016   IR FLUORO GUIDE CV LINE RIGHT 05/07/2016 Corrie Mckusick, DO [facility]  . IR GENERIC HISTORICAL  05/07/2016   IR US GUIDE VASC ACCESS RIGHT 05/07/2016 Corrie Mckusick, DO [facility]  . IR GENERIC HISTORICAL  05/22/2016   IR US GUIDE VASC ACCESS RIGHT 05/22/2016 Greggory Keen, MD [facility]  . IR GENERIC HISTORICAL  05/22/2016   IR FLUORO GUIDE CV LINE RIGHT 05/22/2016 Greggory Keen, MD [facility]  . IR REMOVAL TUN CV CATH W/O FL  08/21/2016  . PERIPHERAL VASCULAR CATHETERIZATION Left 05/09/2016   Procedure: A/V Fistulagram;  Surgeon: Angelia Mould, MD;  Location: [place] CV LAB;  Service: Cardiovascular;  Laterality: Left;  arm        Home Medications    Prior to Admission medications   Medication Sig Start Date End Date Taking? Authorizing Provider  bictegravir-emtricitabine-tenofovir AF (BIKTARVY) 50-200-25 MG TABS tablet Take 1 tablet by mouth daily. Patient not taking: Reported on 06/16/2018 11/19/17   Thayer Headings, MD    Family History Family History  Problem Relation Age of Onset  . Diabetes Mother   . Diabetes Father     Social History Social History   Tobacco Use  . Smoking status: Never Smoker  . Smokeless tobacco: Never Used  Substance Use Topics  . Alcohol use: No  . Drug use: No     Allergies   Patient has no known allergies.   Review of Systems Review of Systems  Constitutional: Positive for chills and fatigue.  HENT: Negative for congestion, rhinorrhea and sore  throat.   Respiratory: Positive for cough. Negative for shortness of breath.   Cardiovascular: Positive for chest pain.  Gastrointestinal: Negative for abdominal pain, diarrhea, nausea and vomiting.  Genitourinary: Negative for dysuria.  Musculoskeletal: Positive for back pain and myalgias.  Allergic/Immunologic: Positive for immunocompromised state.  All other systems reviewed and are negative.    Physical Exam Updated Vital Signs BP (!) 82/49   Pulse 94   Temp 99.1 F (37.3 C) (Oral)   Resp (!) 29   SpO2 97%   Physical Exam Vitals signs and nursing note reviewed.  Constitutional:      General: He is not in acute distress.    Appearance: Normal appearance. He is not ill-appearing.     Comments: Chronically ill-appearing  HENT:     Head: Normocephalic and atraumatic.     Nose: Nose normal.     Mouth/Throat:     Mouth: Mucous membranes are moist.  Eyes:     General: No scleral icterus.  Right eye: No discharge.        Left eye: No discharge.     Conjunctiva/sclera: Conjunctivae normal.     Pupils: Pupils are equal, round, and reactive to light.  Neck:     Musculoskeletal: Normal range of motion.  Cardiovascular:     Rate and Rhythm: Normal rate and regular rhythm.  Pulmonary:     Effort: Pulmonary effort is normal. No respiratory distress.     Breath sounds: Normal breath sounds.  Abdominal:     General: There is no distension.     Palpations: Abdomen is soft.     Tenderness: There is no abdominal tenderness.  Musculoskeletal:     Comments: Mild low back tenderness  Left BKA  Right heel: 2 quarter sized superficial ulcers on the plantar aspect of the foot. No erythema or purulent drainage. Moderate swelling of the entire foot and ankle.  Skin:    General: Skin is warm and dry.  Neurological:     Mental Status: He is alert and oriented to person, place, and time.  Psychiatric:        Behavior: Behavior normal. Behavior is cooperative.      ED  Treatments / Results  Labs (all labs ordered are listed, but only abnormal results are displayed) Labs Reviewed  BASIC METABOLIC PANEL - Abnormal; Notable for the following components:      Result Value   Chloride 91 (*)    Glucose, Bld 152 (*)    BUN 27 (*)    Creatinine, Ser 7.43 (*)    Calcium 8.2 (*)    GFR calc non Af Amer 8 (*)    GFR calc Af Amer 9 (*)    All other components within normal limits  CBC - Abnormal; Notable for the following components:   RBC 3.29 (*)    Hemoglobin 10.5 (*)    HCT 33.1 (*)    MCV 100.6 (*)    All other components within normal limits  LACTIC ACID, PLASMA - Abnormal; Notable for the following components:   Lactic Acid, Venous 2.9 (*)    All other components within normal limits  HEPATIC FUNCTION PANEL - Abnormal; Notable for the following components:   Total Protein 8.9 (*)    Albumin 3.2 (*)    All other components within normal limits  CULTURE, BLOOD (ROUTINE X 2)  CULTURE, BLOOD (ROUTINE X 2)  INFLUENZA PANEL BY PCR (TYPE A & B)  DIFFERENTIAL  LACTIC ACID, PLASMA  URINALYSIS, ROUTINE W REFLEX MICROSCOPIC  I-STAT TROPONIN, ED    EKG EKG Interpretation  Date/Time:  Tuesday June 16 2018 22:24:51 EST Ventricular Rate:  94 PR Interval:    QRS Duration: 103 QT Interval:  382 QTC Calculation: 478 R Axis:   28 Text Interpretation:  Sinus rhythm Short PR interval Baseline wander in lead(s) II III aVL aVF No significant change was found Confirmed by Shanon Rosser [phone]) on 06/16/2018 10:46:48 PM   Radiology Dg Chest 2 View  Result Date: 06/16/2018 CLINICAL DATA:  Mid chest pain and weakness in a dialysis patient. EXAM: CHEST - 2 VIEW COMPARISON:  08/15/2016 FINDINGS: Shallow inspiration. Normal heart size and pulmonary vascularity. No focal airspace disease or consolidation in the lungs. No blunting of costophrenic angles. No pneumothorax. Mediastinal contours appear intact. Interval removal of right central venous catheter since  previous study. Healing fracture of the midshaft left clavicle, new since prior study. IMPRESSION: No evidence of active pulmonary disease. Healing fracture of the midshaft  left clavicle. Electronically Signed   By: Lucienne Capers M.D.   On: 06/16/2018 23:34    Procedures Procedures (including critical care time)  CRITICAL CARE Performed by: Recardo Evangelist   Total critical care time: 40 minutes  Critical care time was exclusive of separately billable procedures and treating other patients.  Critical care was necessary to treat or prevent imminent or life-threatening deterioration.  Critical care was time spent personally by me on the following activities: development of treatment plan with patient and/or surrogate as well as nursing, discussions with consultants, evaluation of patient's response to treatment, examination of patient, obtaining history from patient or surrogate, ordering and performing treatments and interventions, ordering and review of laboratory studies, ordering and review of radiographic studies, pulse oximetry and re-evaluation of patient's condition.   Medications Ordered in ED Medications  metroNIDAZOLE (FLAGYL) IVPB 500 mg (0 mg Intravenous Stopped 06/17/18 0454)  vancomycin (VANCOCIN) 1,250 mg in sodium chloride 0.9 % 250 mL IVPB (1,250 mg Intravenous New Bag/Given 06/17/18 0453)  sodium chloride 0.9 % bolus 500 mL (0 mLs Intravenous Stopped 06/17/18 0224)  acetaminophen (TYLENOL) tablet 650 mg (650 mg Oral Given 06/17/18 0123)  sodium chloride 0.9 % bolus 1,000 mL (0 mLs Intravenous Stopped 06/17/18 0228)  ceFEPIme (MAXIPIME) 2 g in sodium chloride 0.9 % 100 mL IVPB (0 g Intravenous Stopped 06/17/18 0309)  sodium chloride 0.9 % bolus 500 mL (0 mLs Intravenous Stopped 06/17/18 0429)     Initial Impression / Assessment and Plan / ED Course  I have reviewed the triage vital signs and the nursing notes.  Pertinent labs & imaging results that were available during my  care of the patient were reviewed by me and considered in my medical decision making (see chart for details).  48 year old male presents with generalized malaise, body aches, chest pain and back pain for 2 days.  Blood pressures are soft.  On review of EMR he has a low blood pressure at baseline.  Oral temperature was checked by me which is 100.3. Other vital signs are normal.  Heart is regular rate and rhythm.  Lungs are clear to auscultation.  Abdomen is soft and nontender.  He does have chronic superficial ulcers on his foot which do not look infected. Pt meets SIRS criteria and since he is immunosuppressed, will initiate code sepsis.   CBC is remarkable for moderate anemia. BMP is remarkable for elevated SCr which is around his baseline. LFTs are normal. Flu is negative. CXR is negative. EKG is SR. I-stat trop is 0.02. Initial lactic acid is 2.9. He was given 30cc/kg bolus and broad spectrum abx. Discussed with Dr. Florina Ou. Discussed with Dr. Darrick Meigs who will come to see the patient due to persistent hypotension.   Final Clinical Impressions(s) / ED Diagnoses   Final diagnoses:  Sepsis, due to unspecified organism, unspecified whether acute organ dysfunction present ([place])  ESRD (end stage renal disease) ([place])  Hypotension, unspecified hypotension type    ED Discharge Orders    None       Recardo Evangelist, PA-C 06/17/18 [id number]    Recardo Evangelist, PA-C 06/17/18 8850    Shanon Rosser, MD 06/17/18 [phone]

## 2018-06-17 NOTE — ED Notes (Signed)
EKG given to Marisa Severin, PA and then given to Dr. Florina Ou.

## 2018-06-17 NOTE — H&P (Addendum)
History and Physical    Jerusalem Spong KDX:[id number] DOB: 07/30/1970 DOA: 06/16/2018  I have briefly reviewed the patient's prior medical records in [facility]  PCP: [name], MD  Patient coming from: home  Chief Complaint: fevers/chills/not feeling good  HPI: Alvin Daniels is a 48 y.o. male with medical history significant of HIV, chronic hypotension, end-stage renal disease, left BKA, who presents to the hospital with chief complaints of not feeling good over the last 2 to 3 days.  Patient reports generalized weakness, fever and chills, as well as a productive cough.  He states that he is bringing dark sputum.  He also has been having a slight sore throat.  He underwent dialysis yesterday, and was able to finish but continued to get worse, had generalized muscle aches with bilateral hip pain, was also complaining of right-sided chest pain along with a cough.  He decided to come to the emergency room last night.  He denies any runny nose, he denies any lightheadedness or dizziness.  He denies any shortness of breath.  He has no abdominal pain, no nausea vomiting or diarrhea.  ED Course: In the emergency room patient was febrile and hypotensive, given HIV status he was treated as sepsis, he received IV fluids, received vancomycin and cefepime and he is feeling a lot better on my evaluation.  Blood cultures were obtained.  Initial lactic acid was 2.9 and normalized with fluids.  Influenza was negative.  Chest x-ray without infiltrates.  EKG nonischemic.  We are asked to admit  Review of Systems: As per HPI otherwise 10 point review of systems negative.   Past Medical History:  Diagnosis Date  . AIDS ([place]) 11/22/2014  . Chronic diarrhea   . Chronic hepatitis C without hepatic coma ([place]) 11/22/2014  . Diabetic neuropathy ([place])   . ESRD (end stage renal disease) on dialysis [facility])    "TTS; don't remember street name" (05/03/2014)  . Hepatitis C   . HIV INFECTION  06/27/2010   Qualifier: Diagnosis of  By: Nickola Major CMA ( [place]), Geni Bers    . Hypotension 06/02/2012  . Metabolic bone disease 01/17/6733  . MRSA infection   . Normocytic anemia 06/17/2012  . Pancreatitis   . Pressure ulcer of BKA stump, stage 2 ([place]) 11/22/2014  . Renal disorder   . Severe protein-calorie malnutrition ([place]) 06/17/2012  . Uncontrolled diabetes mellitus with complications ([place]) 1/93/7902   Annotation: uncontrolled Qualifier: Diagnosis of  By: Nickola Major CMA Deborra Medina), Geni Bers      Past Surgical History:  Procedure Laterality Date  . AMPUTATION Left 04/20/2014   Procedure: 3rd toe amputation, 4th Toe Amputation,  5th Toe Amputation;  Surgeon: Newt Minion, MD;  Location: [place];  Service: Orthopedics;  Laterality: Left;  . AMPUTATION Left 05/02/2014   Procedure: Midfoot Amputation;  Surgeon: Newt Minion, MD;  Location: [place];  Service: Orthopedics;  Laterality: Left;  . AMPUTATION Left 06/17/2014   Procedure: AMPUTATION BELOW KNEE;  Surgeon: Newt Minion, MD;  Location: [place];  Service: Orthopedics;  Laterality: Left;  . AV FISTULA PLACEMENT Left   . AV FISTULA PLACEMENT Left 05/10/2016   Procedure: Creation Left Arm Brachiocephalic Arteriovenous Fistula and Ligation of Radiocephalic Fistula;  Surgeon: Angelia Mould, MD;  Location: [place];  Service: Vascular;  Laterality: Left;  . FEMUR IM NAIL Left 08/17/2016   Procedure: INTRAMEDULLARY (IM) RETROGRADE FEMORAL NAILING;  Surgeon: Marybelle Killings, MD;  Location: [place];  Service: Orthopedics;  Laterality: Left;  .  FOOT AMPUTATION THROUGH ANKLE Left 12/'21/2015   midfoot  . IR GENERIC HISTORICAL Left 05/01/2016   IR THROMBECTOMY AV FISTULA W/THROMBOLYSIS/PTA INC/SHUNT/IMG LEFT 05/01/2016 [name], MD [facility]  . IR GENERIC HISTORICAL  05/01/2016   IR US GUIDE VASC ACCESS LEFT 05/01/2016 [name], MD [facility]  . IR GENERIC HISTORICAL  05/07/2016   IR FLUORO GUIDE CV LINE RIGHT 05/07/2016 Corrie Mckusick, DO  [facility]  . IR GENERIC HISTORICAL  05/07/2016   IR US GUIDE VASC ACCESS RIGHT 05/07/2016 Corrie Mckusick, DO [facility]  . IR GENERIC HISTORICAL  05/22/2016   IR US GUIDE VASC ACCESS RIGHT 05/22/2016 Greggory Keen, MD [facility]  . IR GENERIC HISTORICAL  05/22/2016   IR FLUORO GUIDE CV LINE RIGHT 05/22/2016 Greggory Keen, MD [facility]  . IR REMOVAL TUN CV CATH W/O FL  08/21/2016  . PERIPHERAL VASCULAR CATHETERIZATION Left 05/09/2016   Procedure: A/V Fistulagram;  Surgeon: Angelia Mould, MD;  Location: [place] CV LAB;  Service: Cardiovascular;  Laterality: Left;  arm     reports that he has never smoked. He has never used smokeless tobacco. He reports that he does not drink alcohol or use drugs.  No Known Allergies  Family History  Problem Relation Age of Onset  . Diabetes Mother   . Diabetes Father     Prior to Admission medications   Medication Sig Start Date End Date Taking? Authorizing Provider  bictegravir-emtricitabine-tenofovir AF (BIKTARVY) 50-200-25 MG TABS tablet Take 1 tablet by mouth daily. Patient not taking: Reported on 06/16/2018 11/19/17   Thayer Headings, MD    Physical Exam: Vitals:   06/17/18 0235 06/17/18 0351 06/17/18 0454 06/17/18 0637  BP:  (!) 77/47 (!) 72/42 (!) 85/52  Pulse:  81 76 80  Resp:  16 14 18   Temp:  99.1 F (37.3 C) 99.1 F (37.3 C)   TempSrc:  Oral Oral   SpO2:  98% 97% 100%  Weight: 58 kg     Height: 5\' 1"  (1.549 m)       Constitutional: NAD, calm, comfortable Eyes: PERRL, lids and conjunctivae normal ENMT: Mucous membranes are moist. Posterior pharynx clear of any exudate or lesions. Neck: normal, supple, no LAD Respiratory: clear to auscultation bilaterally, no wheezing, no crackles. Normal respiratory effort. No accessory muscle use.  Cardiovascular: Regular rate and rhythm, no murmurs / rubs / gallops. No extremity edema. 2+ Abdomen: no tenderness, no masses palpated. Bowel sounds positive.  Musculoskeletal: no  clubbing / cyanosis.  Left AKA Skin: Small round very superficial ulcer right foot (over heel), no evidence of infection Neurologic: CN 2-12 grossly intact. Strength 5/5 in all 4.  Psychiatric: Normal judgment and insight. Alert and oriented x 3. Normal mood.   Labs on Admission: I have personally reviewed following labs and imaging studies  CBC: Recent Labs  Lab 06/16/18 2300  WBC 9.2  NEUTROABS 7.0  HGB 10.5*  HCT 33.1*  MCV 100.6*  PLT 956   Basic Metabolic Panel: Recent Labs  Lab 06/16/18 2300  NA 135  K 3.9  CL 91*  CO2 30  GLUCOSE 152*  BUN 27*  CREATININE 7.43*  CALCIUM 8.2*   GFR: Estimated Creatinine Clearance: 9.1 mL/min (A) (by C-G formula based on SCr of 7.43 mg/dL (H)). Liver Function Tests: Recent Labs  Lab 06/16/18 2302  AST 25  ALT 13  ALKPHOS 76  BILITOT 0.6  PROT 8.9*  ALBUMIN 3.2*   No results for input(s): LIPASE, AMYLASE  in the last 168 hours. No results for input(s): AMMONIA in the last 168 hours. Coagulation Profile: No results for input(s): INR, PROTIME in the last 168 hours. Cardiac Enzymes: No results for input(s): CKTOTAL, CKMB, CKMBINDEX, TROPONINI in the last 168 hours. BNP (last 3 results) No results for input(s): PROBNP in the last 8760 hours. HbA1C: No results for input(s): HGBA1C in the last 72 hours. CBG: No results for input(s): GLUCAP in the last 168 hours. Lipid Profile: No results for input(s): CHOL, HDL, LDLCALC, TRIG, CHOLHDL, LDLDIRECT in the last 72 hours. Thyroid Function Tests: No results for input(s): TSH, T4TOTAL, FREET4, T3FREE, THYROIDAB in the last 72 hours. Anemia Panel: No results for input(s): VITAMINB12, FOLATE, FERRITIN, TIBC, IRON, RETICCTPCT in the last 72 hours. Urine analysis:    Component Value Date/Time   COLORURINE YELLOW 07/04/2010 1840   APPEARANCEUR TURBID (A) 07/04/2010 1840   LABSPEC 1.013 07/04/2010 1840   PHURINE 7.0 07/04/2010 1840   GLUCOSEU NEG mg/dL 07/04/2010 1840   HGBUR  TRACE (A) 03/13/2010 1824   BILIRUBINUR NEG 07/04/2010 1840   KETONESUR NEG mg/dL 07/04/2010 1840   PROTEINUR > 300 mg/dL (A) 07/04/2010 1840   UROBILINOGEN 0.2 07/04/2010 1840   NITRITE NEG 07/04/2010 1840   LEUKOCYTESUR LARGE (A) 07/04/2010 1840     Radiological Exams on Admission: Dg Chest 2 View  Result Date: 06/16/2018 CLINICAL DATA:  Mid chest pain and weakness in a dialysis patient. EXAM: CHEST - 2 VIEW COMPARISON:  08/15/2016 FINDINGS: Shallow inspiration. Normal heart size and pulmonary vascularity. No focal airspace disease or consolidation in the lungs. No blunting of costophrenic angles. No pneumothorax. Mediastinal contours appear intact. Interval removal of right central venous catheter since previous study. Healing fracture of the midshaft left clavicle, new since prior study. IMPRESSION: No evidence of active pulmonary disease. Healing fracture of the midshaft left clavicle. Electronically Signed   By: Lucienne Capers M.D.   On: 06/16/2018 23:34    EKG: Independently reviewed. Sinus rhythm  CXR reviewed - no infiltrates   Assessment/Plan Active Problems:   Hypotension   Principal Problem SIRS -With significant hypotension with blood pressure in the 60s and 70s on admission.  He received IV fluids 30 cc/kg blood pressure now improved to 24O systolic.  He is completely asymptomatic with this.  He was also placed on Midodrine -Given HIV status and relative immunosuppression will continue treatment with vancomycin and cefepime while cultures are pending -Influenza was negative, but symptoms highly suggestive of an upper viral respiratory infection  Active Problems End-stage renal disease -We will consult nephrology today, he is scheduled for dialysis tomorrow  Chest pain -No cardiac history, will cycle cardiac enzymes but very atypical for ACS most likely related to the cough  HIV / chronic Hep C -Most recent CD4 count 270 in July 2019, HIV RNA quantitative  undetectable -followed by Dr. Linus Salmons as an outpatient  Type 2 diabetes mellitus -Patient does not seem to be on any diabetic medications at home.  Chart review shows that most recent hemoglobin A1c was 13.4 in July 2019. -He is hyperglycemic here, will place on sliding scale insulin and repeat an A1c   DVT prophylaxis: Heparin Code Status: Full code Family Communication: No family at bedside Disposition Plan: Admit to stepdown Consults called: Nephrology   Marzetta Board, MD, PhD [facility]  Contact via [email or web address]  [facility] Office Info P: [phone]  F: [phone]   06/17/2018, 7:10 AM

## 2018-06-17 NOTE — ED Notes (Signed)
Patient leaving with [facility] at this time.   Abigail, RN called and made aware of pt elevated CBG, and  insulin given. Also made aware that pt c/o right chest pain to [facility], that pt has hx of clavicle fx, EKG done and reviewed by EDP, Dr. Gilford Raid, and hospitalist paged.

## 2018-06-17 NOTE — ED Notes (Signed)
Hospitalist paged regarding elevated CBG. Waiting for response.

## 2018-06-17 NOTE — ED Notes (Signed)
c-bolus went by gravity scanned later

## 2018-06-17 NOTE — ED Notes (Signed)
Pt is aware a urine sample is needed

## 2018-06-17 NOTE — Progress Notes (Signed)
Patient's temp 103, BS 78, gave him a bath with tepid water and changed his sheets, recheck his temp 100.9 and BS 99, text paged [facility] and got orders for NSS 500cc IV bolus, will give a little slower D/T his renal disease and inability to urinate, B/P is soft as well, will continue to monitor.

## 2018-06-17 NOTE — ED Notes (Signed)
Patient is now wanting to leave. Nurse Secretary has paged Hospitalist. 2 IV's were removed by someone in white lab coat patient stated.

## 2018-06-17 NOTE — Progress Notes (Signed)
CSW received consult for "no health insurance"- financial counseling receives consults for patients with no insurance and should follow up with patient once admitted.   Kingsley Spittle, LCSW Clinical Social Worker  [facility]  [phone]

## 2018-06-17 NOTE — Consult Note (Signed)
[facility] Reason for Consult: To manage dialysis and dialysis related needs Referring Physician: Dr. Cruzita Lederer  HPI:  Alvin Daniels is an 48 y.o. male with history of diabetes, chronic diarrhea, hepatitis C, HIV infection, left BKA, chronic hypotension, ESRD on dialysis TTS at [facility] via AV fistula, presented with generalized weakness, fever chills and productive cough.  He had last dialysis yesterday.  In the ER, he had temperature of 100.5 associated with blood pressure 65/42.  He received IV fluid.  Blood pressure improved to 94W systolic.  He remains clinically asymptomatic.  Chest x-ray with no acute finding.  Influenza negative.  During evaluation, patient denied headache, dizziness, lightheadedness, chest pain, shortness of breath, nausea or vomiting.  His wife at bedside.  Dialyzes at [place], 4 hour, 2K, 2 ca, 400/800,   EDW 53 Kg. Access AVF Mircera 50 mcg last dose 1/30 Last PTH 141, phos 6.8 on 06/04/2018 Hectorol 3 mcg every rx Heparin 2000 units bolus every rx  Past Medical History:  Diagnosis Date  . AIDS ([place]) 11/22/2014  . Chronic diarrhea   . Chronic hepatitis C without hepatic coma ([place]) 11/22/2014  . Diabetic neuropathy ([place])   . ESRD (end stage renal disease) on dialysis [facility])    "TTS; don't remember street name" (05/03/2014)  . Hepatitis C   . HIV INFECTION 06/27/2010   Qualifier: Diagnosis of  By: Nickola Major CMA ( [place]), Geni Bers    . Hypotension 06/02/2012  . Metabolic bone disease 09/13/6268  . MRSA infection   . Normocytic anemia 06/17/2012  . Pancreatitis   . Pressure ulcer of BKA stump, stage 2 ([place]) 11/22/2014  . Renal disorder   . Severe protein-calorie malnutrition ([place]) 06/17/2012  . Uncontrolled diabetes mellitus with complications ([place]) 3/50/0938   Annotation: uncontrolled Qualifier: Diagnosis of  By: Nickola Major CMA Deborra Medina), Geni Bers      Past Surgical History:  Procedure Laterality Date  . AMPUTATION Left  04/20/2014   Procedure: 3rd toe amputation, 4th Toe Amputation,  5th Toe Amputation;  Surgeon: Newt Minion, MD;  Location: [place];  Service: Orthopedics;  Laterality: Left;  . AMPUTATION Left 05/02/2014   Procedure: Midfoot Amputation;  Surgeon: Newt Minion, MD;  Location: [place];  Service: Orthopedics;  Laterality: Left;  . AMPUTATION Left 06/17/2014   Procedure: AMPUTATION BELOW KNEE;  Surgeon: Newt Minion, MD;  Location: [place];  Service: Orthopedics;  Laterality: Left;  . AV FISTULA PLACEMENT Left   . AV FISTULA PLACEMENT Left 05/10/2016   Procedure: Creation Left Arm Brachiocephalic Arteriovenous Fistula and Ligation of Radiocephalic Fistula;  Surgeon: Angelia Mould, MD;  Location: [place];  Service: Vascular;  Laterality: Left;  . FEMUR IM NAIL Left 08/17/2016   Procedure: INTRAMEDULLARY (IM) RETROGRADE FEMORAL NAILING;  Surgeon: Marybelle Killings, MD;  Location: [place];  Service: Orthopedics;  Laterality: Left;  . FOOT AMPUTATION THROUGH ANKLE Left 12/'21/2015   midfoot  . IR GENERIC HISTORICAL Left 05/01/2016   IR THROMBECTOMY AV FISTULA W/THROMBOLYSIS/PTA INC/SHUNT/IMG LEFT 05/01/2016 [name], MD [facility]  . IR GENERIC HISTORICAL  05/01/2016   IR US GUIDE VASC ACCESS LEFT 05/01/2016 [name], MD [facility]  . IR GENERIC HISTORICAL  05/07/2016   IR FLUORO GUIDE CV LINE RIGHT 05/07/2016 Corrie Mckusick, DO [facility]  . IR GENERIC HISTORICAL  05/07/2016   IR US GUIDE VASC ACCESS RIGHT 05/07/2016 Corrie Mckusick, DO [facility]  . IR GENERIC HISTORICAL  05/22/2016   IR US GUIDE  VASC ACCESS RIGHT 05/22/2016 Greggory Keen, MD [facility]  . IR GENERIC HISTORICAL  05/22/2016   IR FLUORO GUIDE CV LINE RIGHT 05/22/2016 Greggory Keen, MD [facility]  . IR REMOVAL TUN CV CATH W/O FL  08/21/2016  . PERIPHERAL VASCULAR CATHETERIZATION Left 05/09/2016   Procedure: A/V Fistulagram;  Surgeon: Angelia Mould, MD;  Location: [place] CV [facility];  Service: Cardiovascular;   Laterality: Left;  arm    Family History  Problem Relation Age of Onset  . Diabetes Mother   . Diabetes Father     Social History:  reports that he has never smoked. He has never used smokeless tobacco. He reports that he does not drink alcohol or use drugs.  Allergies: No Known Allergies  Medications: I have reviewed the patient's current medications.   Results for orders placed or performed during the hospital encounter of 06/16/18 (from the past 48 hour(s))  Basic metabolic panel     Status: Abnormal   Collection Time: 06/16/18 11:00 PM  Result Value Ref Range   Sodium 135 135 - 145 mmol/L   Potassium 3.9 3.5 - 5.1 mmol/L   Chloride 91 (L) 98 - 111 mmol/L   CO2 30 22 - 32 mmol/L   Glucose, Bld 152 (H) 70 - 99 mg/dL   BUN 27 (H) 6 - 20 mg/dL   Creatinine, Ser 7.43 (H) 0.61 - 1.24 mg/dL   Calcium 8.2 (L) 8.9 - 10.3 mg/dL   GFR calc non Af Amer 8 (L) >60 mL/min   GFR calc Af Amer 9 (L) >60 mL/min   Anion gap 14 5 - 15    Comment: Performed at [facility], [place] [address] [place], [place] [address]  CBC     Status: Abnormal   Collection Time: 06/16/18 11:00 PM  Result Value Ref Range   WBC 9.2 4.0 - 10.5 K/uL   RBC 3.29 (L) 4.22 - 5.81 MIL/uL   Hemoglobin 10.5 (L) 13.0 - 17.0 g/dL   HCT 33.1 (L) 39.0 - 52.0 %   MCV 100.6 (H) 80.0 - 100.0 fL   MCH 31.9 26.0 - 34.0 pg   MCHC 31.7 30.0 - 36.0 g/dL   RDW 13.9 11.5 - 15.5 %   Platelets 189 150 - 400 K/uL   nRBC 0.0 0.0 - 0.2 %    Comment: Performed at [facility], [place] [address] [place], [place] [address]  Differential     Status: None   Collection Time: 06/16/18 11:00 PM  Result Value Ref Range   Neutrophils Relative % 80 %   Neutro Abs 7.0 1.7 - 7.7 K/uL   Lymphocytes Relative 11 %   Lymphs Abs 1.0 0.7 - 4.0 K/uL   Monocytes Relative 7 %   Monocytes Absolute 0.6 0.1 - 1.0 K/uL   Eosinophils Relative 2 %   Eosinophils Absolute 0.1 0.0 - 0.5 K/uL   Basophils Relative 1 %    Basophils Absolute 0.0 0.0 - 0.1 K/uL    Comment: Performed at [facility], [place] [address], [place], [place] [address]  I-stat troponin, ED     Status: None   Collection Time: 06/16/18 11:02 PM  Result Value Ref Range   Troponin i, poc 0.02 0.00 - 0.08 ng/mL   Comment 3            Comment: Due to the release kinetics of cTnI, a negative result within the first hours of the onset of symptoms does not  rule out myocardial infarction with certainty. If myocardial infarction is still suspected, repeat the test at appropriate intervals.   Hepatic function panel     Status: Abnormal   Collection Time: 06/16/18 11:02 PM  Result Value Ref Range   Total Protein 8.9 (H) 6.5 - 8.1 g/dL   Albumin 3.2 (L) 3.5 - 5.0 g/dL   AST 25 15 - 41 U/L   ALT 13 0 - 44 U/L   Alkaline Phosphatase 76 38 - 126 U/L   Total Bilirubin 0.6 0.3 - 1.2 mg/dL   Bilirubin, Direct 0.1 0.0 - 0.2 mg/dL   Indirect Bilirubin 0.5 0.3 - 0.9 mg/dL    Comment: Performed at [facility], [place] [address], [place], [place] [address]  Influenza panel by PCR (type A & B)     Status: None   Collection Time: 06/17/18  1:17 AM  Result Value Ref Range   Influenza A By PCR NEGATIVE NEGATIVE   Influenza B By PCR NEGATIVE NEGATIVE    Comment: (NOTE) The Xpert Xpress Flu assay is intended as an aid in the diagnosis of  influenza and should not be used as a sole basis for treatment.  This  assay is FDA approved for nasopharyngeal swab specimens only. Nasal  washings and aspirates are unacceptable for Xpert Xpress Flu testing. Performed at [facility], [place] [address], [place], [place] [address]   Lactic acid, plasma     Status: Abnormal   Collection Time: 06/17/18  1:38 AM  Result Value Ref Range   Lactic Acid, Venous 2.9 (HH) 0.5 - 1.9 mmol/L    Comment: CRITICAL RESULT CALLED TO, READ BACK BY AND VERIFIED WITHHulda Marin RN [id number] 06/17/18 A NAVARRO Performed at [facility], [place] [address], [place], [place] [address]   Blood Culture (routine x 2)     Status: None (Preliminary result)   Collection Time: 06/17/18  2:14 AM  Result Value Ref Range   Specimen Description BLOOD RIGHT ARM    Special Requests      BOTTLES DRAWN AEROBIC AND ANAEROBIC Blood Culture adequate volume Performed at [place] [address], [place], [place] [address]    Culture PENDING    Report Status PENDING   Lactic acid, plasma     Status: None   Collection Time: 06/17/18  5:00 AM  Result Value Ref Range   Lactic Acid, Venous 0.8 0.5 - 1.9 mmol/L    Comment: Performed at [facility], [place] [address] [place], [place] [address]  Troponin I - Now Then Q6H     Status: None   Collection Time: 06/17/18  8:30 AM  Result Value Ref Range   Troponin I <0.03 <0.03 ng/mL    Comment: Performed at [facility], [place] [address] [place],  [address]    Dg Chest 2 View  Result Date: 06/16/2018 CLINICAL DATA:  Mid chest pain and weakness in a dialysis patient. EXAM: CHEST - 2 VIEW COMPARISON:  08/15/2016 FINDINGS: Shallow inspiration. Normal heart size and pulmonary vascularity. No focal airspace disease or consolidation in the lungs. No blunting of costophrenic angles. No pneumothorax. Mediastinal contours appear intact. Interval removal of right central venous catheter since previous study. Healing fracture of the midshaft left clavicle, new since prior study. IMPRESSION: No evidence of active pulmonary disease. Healing fracture of the midshaft left clavicle. Electronically Signed   By: Lucienne Capers M.D.   On: 06/16/2018 23:34  ROS:  Blood pressure (!) 90/53, pulse 100, temperature 99.1 F (37.3 C), temperature source Oral, resp. rate 18, height 5\' 1"  (1.549 m), weight 58 kg, SpO2 100 %.   Assessment/Plan: #Fever/SIRS: Blood culture was sent and patient is on empiric antibiotics.  Influenza negative.   Chest x-ray with no acute finding.  Per primary team.  #ESRD on HD, TTS: Plan for next dialysis tomorrow.  He has AV fistula for the access.  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  He received Mircera on 06/11/2018.  Monitor CBC.  #Secondary hyperparathyroidism: Continue Hectorol during treatment.  Monitor Foss level.  #Hypotension: Patient has chronic hypotension.  Agree with midodrine.  He has no symptoms.   Journei Thomassen [name] 06/17/2018, 1:57 PM

## 2018-06-17 NOTE — Progress Notes (Signed)
A consult was received from an ED physician for cefepime/vancomycin per pharmacy dosing.  The patient's profile has been reviewed for ht/wt/allergies/indication/available labs.   A one time order has been placed for Cefepime 2 Gm and vancomycin 1250 mg.  Further antibiotics/pharmacy consults should be ordered by admitting physician if indicated.                       Thank you, Dorrene German 06/17/2018  1:47 AM

## 2018-06-17 NOTE — Progress Notes (Signed)
Pharmacy Antibiotic Note  Alvin Daniels is a 48 y.o. male admitted on 06/16/2018 with medical history significant of HIV, chronic hypotension, end-stage renal disease, left BKA, who presents to the hospital with chief complaints of not feeling good over the last 2 to 3 days.  Last dialysis was yesterday. Pharmacy has been consulted for vancomycin and cefepime dosing for sepsis.  Plan: Vancomycin 1250mg  IV x 1 in ED, will check random level in 48 hours and dose accordingly Cefepime 2gm IV in ED x 1 then 1gm q24h flagyl 500mg  IV q8h (per MD) Follow renal function, cultures and clinical course  Height: 5\' 1"  (154.9 cm) Weight: 127 lb 13.9 oz (58 kg) IBW/kg (Calculated) : 52.3  Temp (24hrs), Avg:99.6 F (37.6 C), Min:99.1 F (37.3 C), Max:100.5 F (38.1 C)  Recent Labs  Lab 06/16/18 2300 06/17/18 0138 06/17/18 0500  WBC 9.2  --   --   CREATININE 7.43*  --   --   LATICACIDVEN  --  2.9* 0.8    Estimated Creatinine Clearance: 9.1 mL/min (A) (by C-G formula based on SCr of 7.43 mg/dL (H)).    No Known Allergies  Antimicrobials this admission: 2/5 vanc >> 2/5 cefepime >> 2/5 flagyl >> Dose adjustments this admission:   Microbiology results: 2/5 BCx:    Thank you for allowing pharmacy to be a part of this patient's care.  Dolly Rias RPh 06/17/2018, 7:33 AM Pager [phone]

## 2018-06-17 NOTE — ED Notes (Signed)
Patient states he no longer makes urine, therefore he is unable to provide a urine sample.

## 2018-06-18 ENCOUNTER — Ambulatory Visit (HOSPITAL_COMMUNITY): Payer: Medicaid Other

## 2018-06-18 ENCOUNTER — Inpatient Hospital Stay (HOSPITAL_COMMUNITY): Payer: Medicaid Other

## 2018-06-18 ENCOUNTER — Other Ambulatory Visit (HOSPITAL_COMMUNITY): Payer: Self-pay

## 2018-06-18 DIAGNOSIS — Z89512 Acquired absence of left leg below knee: Secondary | ICD-10-CM | POA: Diagnosis not present

## 2018-06-18 DIAGNOSIS — R7881 Bacteremia: Secondary | ICD-10-CM

## 2018-06-18 DIAGNOSIS — E1140 Type 2 diabetes mellitus with diabetic neuropathy, unspecified: Secondary | ICD-10-CM | POA: Diagnosis present

## 2018-06-18 DIAGNOSIS — Z833 Family history of diabetes mellitus: Secondary | ICD-10-CM | POA: Diagnosis not present

## 2018-06-18 DIAGNOSIS — L97419 Non-pressure chronic ulcer of right heel and midfoot with unspecified severity: Secondary | ICD-10-CM

## 2018-06-18 DIAGNOSIS — E1165 Type 2 diabetes mellitus with hyperglycemia: Secondary | ICD-10-CM | POA: Diagnosis present

## 2018-06-18 DIAGNOSIS — Z8739 Personal history of other diseases of the musculoskeletal system and connective tissue: Secondary | ICD-10-CM

## 2018-06-18 DIAGNOSIS — N2581 Secondary hyperparathyroidism of renal origin: Secondary | ICD-10-CM | POA: Diagnosis present

## 2018-06-18 DIAGNOSIS — B20 Human immunodeficiency virus [HIV] disease: Secondary | ICD-10-CM | POA: Diagnosis present

## 2018-06-18 DIAGNOSIS — B182 Chronic viral hepatitis C: Secondary | ICD-10-CM | POA: Diagnosis present

## 2018-06-18 DIAGNOSIS — E1142 Type 2 diabetes mellitus with diabetic polyneuropathy: Secondary | ICD-10-CM | POA: Diagnosis present

## 2018-06-18 DIAGNOSIS — Z8614 Personal history of Methicillin resistant Staphylococcus aureus infection: Secondary | ICD-10-CM | POA: Diagnosis not present

## 2018-06-18 DIAGNOSIS — E1122 Type 2 diabetes mellitus with diabetic chronic kidney disease: Secondary | ICD-10-CM

## 2018-06-18 DIAGNOSIS — R6521 Severe sepsis with septic shock: Secondary | ICD-10-CM | POA: Diagnosis present

## 2018-06-18 DIAGNOSIS — Z8619 Personal history of other infectious and parasitic diseases: Secondary | ICD-10-CM

## 2018-06-18 DIAGNOSIS — I9589 Other hypotension: Secondary | ICD-10-CM | POA: Diagnosis present

## 2018-06-18 DIAGNOSIS — Z79899 Other long term (current) drug therapy: Secondary | ICD-10-CM

## 2018-06-18 DIAGNOSIS — E8889 Other specified metabolic disorders: Secondary | ICD-10-CM | POA: Diagnosis present

## 2018-06-18 DIAGNOSIS — A4102 Sepsis due to Methicillin resistant Staphylococcus aureus: Principal | ICD-10-CM

## 2018-06-18 DIAGNOSIS — N186 End stage renal disease: Secondary | ICD-10-CM | POA: Diagnosis present

## 2018-06-18 DIAGNOSIS — Z21 Asymptomatic human immunodeficiency virus [HIV] infection status: Secondary | ICD-10-CM

## 2018-06-18 DIAGNOSIS — E11621 Type 2 diabetes mellitus with foot ulcer: Secondary | ICD-10-CM | POA: Diagnosis present

## 2018-06-18 DIAGNOSIS — Z992 Dependence on renal dialysis: Secondary | ICD-10-CM | POA: Diagnosis not present

## 2018-06-18 DIAGNOSIS — D631 Anemia in chronic kidney disease: Secondary | ICD-10-CM | POA: Diagnosis present

## 2018-06-18 DIAGNOSIS — L97411 Non-pressure chronic ulcer of right heel and midfoot limited to breakdown of skin: Secondary | ICD-10-CM | POA: Diagnosis not present

## 2018-06-18 DIAGNOSIS — K219 Gastro-esophageal reflux disease without esophagitis: Secondary | ICD-10-CM | POA: Diagnosis present

## 2018-06-18 LAB — RENAL FUNCTION PANEL
Albumin: 2.3 g/dL — ABNORMAL LOW (ref 3.5–5.0)
Anion gap: 17 — ABNORMAL HIGH (ref 5–15)
BUN: 44 mg/dL — AB (ref 6–20)
CO2: 20 mmol/L — ABNORMAL LOW (ref 22–32)
Calcium: 8.2 mg/dL — ABNORMAL LOW (ref 8.9–10.3)
Chloride: 101 mmol/L (ref 98–111)
Creatinine, Ser: 10.67 mg/dL — ABNORMAL HIGH (ref 0.61–1.24)
GFR calc Af Amer: 6 mL/min — ABNORMAL LOW (ref >60)
GFR calc non Af Amer: 5 mL/min — ABNORMAL LOW (ref >60)
GLUCOSE: 205 mg/dL — AB (ref 70–99)
PHOSPHORUS: 7.1 mg/dL — AB (ref 2.5–4.6)
Potassium: 5.3 mmol/L — ABNORMAL HIGH (ref 3.5–5.1)
Sodium: 138 mmol/L (ref 135–145)

## 2018-06-18 LAB — HEMOGLOBIN A1C
Hgb A1c MFr Bld: 9 % — ABNORMAL HIGH (ref 4.8–5.6)
Mean Plasma Glucose: 211.6 mg/dL

## 2018-06-18 LAB — BLOOD CULTURE ID PANEL (REFLEXED)
Acinetobacter baumannii: NOT DETECTED
Candida albicans: NOT DETECTED
Candida glabrata: NOT DETECTED
Candida krusei: NOT DETECTED
Candida parapsilosis: NOT DETECTED
Candida tropicalis: NOT DETECTED
ENTEROBACTER CLOACAE COMPLEX: NOT DETECTED
Enterobacteriaceae species: NOT DETECTED
Enterococcus species: NOT DETECTED
Escherichia coli: NOT DETECTED
Haemophilus influenzae: NOT DETECTED
Klebsiella oxytoca: NOT DETECTED
Klebsiella pneumoniae: NOT DETECTED
Listeria monocytogenes: NOT DETECTED
Methicillin resistance: DETECTED — AB
Neisseria meningitidis: NOT DETECTED
Proteus species: NOT DETECTED
Pseudomonas aeruginosa: NOT DETECTED
STREPTOCOCCUS SPECIES: NOT DETECTED
Serratia marcescens: NOT DETECTED
Staphylococcus aureus (BCID): DETECTED — AB
Staphylococcus species: DETECTED — AB
Streptococcus agalactiae: NOT DETECTED
Streptococcus pneumoniae: NOT DETECTED
Streptococcus pyogenes: NOT DETECTED

## 2018-06-18 LAB — ECHOCARDIOGRAM COMPLETE
Height: 61 in
Weight: 1876.56 oz

## 2018-06-18 LAB — CBC
HEMATOCRIT: 29.1 % — AB (ref 39.0–52.0)
Hemoglobin: 9.5 g/dL — ABNORMAL LOW (ref 13.0–17.0)
MCH: 32.3 pg (ref 26.0–34.0)
MCHC: 32.6 g/dL (ref 30.0–36.0)
MCV: 99 fL (ref 80.0–100.0)
Platelets: 174 10*3/uL (ref 150–400)
RBC: 2.94 MIL/uL — ABNORMAL LOW (ref 4.22–5.81)
RDW: 14.2 % (ref 11.5–15.5)
WBC: 11.6 10*3/uL — AB (ref 4.0–10.5)
nRBC: 0 % (ref 0.0–0.2)

## 2018-06-18 LAB — GLUCOSE, CAPILLARY
GLUCOSE-CAPILLARY: 180 mg/dL — AB (ref 70–99)
Glucose-Capillary: 108 mg/dL — ABNORMAL HIGH (ref 70–99)
Glucose-Capillary: 148 mg/dL — ABNORMAL HIGH (ref 70–99)
Glucose-Capillary: 295 mg/dL — ABNORMAL HIGH (ref 70–99)

## 2018-06-18 MED ORDER — VANCOMYCIN HCL IN DEXTROSE 500-5 MG/100ML-% IV SOLN
500.0000 mg | INTRAVENOUS | Status: DC
  Administered 2018-06-18 – 2018-06-23 (×3): 500 mg via INTRAVENOUS
  Filled 2018-06-18 (×3): qty 100

## 2018-06-18 MED ORDER — DOXERCALCIFEROL 4 MCG/2ML IV SOLN
INTRAVENOUS | Status: AC
  Filled 2018-06-18: qty 2

## 2018-06-18 MED ORDER — ONDANSETRON HCL 4 MG/2ML IJ SOLN
4.0000 mg | Freq: Four times a day (QID) | INTRAMUSCULAR | Status: DC | PRN
  Administered 2018-06-18: 4 mg via INTRAVENOUS
  Filled 2018-06-18: qty 2

## 2018-06-18 MED ORDER — MIDODRINE HCL 5 MG PO TABS
10.0000 mg | ORAL_TABLET | Freq: Once | ORAL | Status: AC
  Administered 2018-06-18: 10 mg via ORAL

## 2018-06-18 MED ORDER — BUTALBITAL-APAP-CAFFEINE 50-325-40 MG PO TABS
2.0000 | ORAL_TABLET | Freq: Once | ORAL | Status: AC
  Administered 2018-06-18: 2 via ORAL
  Filled 2018-06-18: qty 2

## 2018-06-18 MED ORDER — VANCOMYCIN HCL IN DEXTROSE 500-5 MG/100ML-% IV SOLN
INTRAVENOUS | Status: AC
  Filled 2018-06-18: qty 100

## 2018-06-18 MED ORDER — MIDODRINE HCL 5 MG PO TABS
ORAL_TABLET | ORAL | Status: AC
  Filled 2018-06-18: qty 2

## 2018-06-18 NOTE — Progress Notes (Signed)
  Echocardiogram 2D Echocardiogram has been performed.  Indira Sorenson L Androw 06/18/2018, 1:39 PM

## 2018-06-18 NOTE — Consult Note (Signed)
Date of Admission:  06/16/2018          Reason for Consult:  MRSA bacteremia with septic shock  Referring Provider: Terrilyn Saver auto consult   Assessment:  1. MRSA bacteremia with septic shock and patient on dialysis 2. History of left below the knee amputation--with site looking well-healed 3. Edema and ulceration in his right foot where he has been placing a lot of weight, with concerned that this could be a nidus of infection 4. Well-controlled HIV  Plan:  1. Continue vancomycin 2. Repeat blood cultures 5. Obtain 2D echocardiogram and will plan on getting a transesophageal echocardiogram next week 6. Been right lower extremity with plain films and then likely MRI 7. Need for 6 weeks of IV vancomycin but this could be done with dialysis 8. Reviewed and revisit issue of hip pain that he complained on about when he was admitted 9. Also make sure he is not having significant pathology with his clavicle where he had a fracture 10. Continue Biktarvy for his HIV he has renewed his HIV medication assistance program  Active Problems:   Hypotension   Scheduled Meds: . bictegravir-emtricitabine-tenofovir AF  1 tablet Oral Daily  . doxercalciferol      . doxercalciferol  3 mcg Intravenous Q T,Th,Sa-HD  . heparin  5,000 Units Subcutaneous Q8H  . insulin aspart  0-15 Units Subcutaneous TID WC  . insulin aspart  0-5 Units Subcutaneous QHS  . midodrine  5 mg Oral TID WC  . vancomycin      . vancomycin  500 mg Intravenous Q T,Th,Sa-HD   Continuous Infusions: PRN Meds:.  HPI: Alvin Daniels is a 48 y.o. 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  He was having  generalized weakness he was bringing up some dark sputum and had a sore throat.  After undergoing dialysis he continued to feel worse and had myalgias and bilateral hip pain as well as right-sided chest pain.  He came to the ER in the ER he was hypotensive and required IV fluid resuscitation blood cultures were obtained he was started on broad-spectrum antibiotics.  His chest clear was clear in the interim his blood cultures returned positive for MRSA  The fistula site appears healthy.  Is below the knee amputation site is well-healed and functioning well.  He tells me that he is was being fitted for new prosthesis and not have one currently and was putting excessive amounts of weight on his right foot where he has some blistering and some edema of his right foot.  He had seen a believe an orthopedist who is going to fit him for a new prosthesis.  In any case there are some ulcerations and there is significant edema in the foot I think it needs to be investigated further with more aggressive imaging.  Of course he needs evaluation of his heart valves with a transthoracic echocardiogram for starters and likely a transesophageal echocardiogram early next week.  His HIV remains perfectly controlled on Biktarvy.   Review of Systems: Review of Systems  Constitutional: Positive for chills and fever. Negative for diaphoresis, malaise/fatigue and weight loss.  HENT: Negative for congestion, hearing loss, sore throat  and tinnitus.   Eyes: Negative for blurred vision and double vision.  Respiratory: Positive for cough and sputum production. Negative for shortness of breath and wheezing.   Cardiovascular: Positive for chest pain. Negative for palpitations and leg swelling.  Gastrointestinal: Negative for abdominal pain, blood in stool, constipation, diarrhea, heartburn, melena, nausea and vomiting.  Genitourinary: Negative for dysuria, flank pain and hematuria.  Musculoskeletal: Positive for joint pain  and myalgias. Negative for back pain and falls.  Skin: Negative for itching and rash.  Neurological: Positive for weakness. Negative for dizziness, sensory change, focal weakness, loss of consciousness and headaches.  Endo/Heme/Allergies: Does not bruise/bleed easily.  Psychiatric/Behavioral: Negative for depression, memory loss and suicidal ideas. The patient is not nervous/anxious.     Past Medical History:  Diagnosis Date  . AIDS ([place]) 11/22/2014  . Chronic diarrhea   . Chronic hepatitis C without hepatic coma ([place]) 11/22/2014  . Diabetic neuropathy ([place])   . ESRD (end stage renal disease) on dialysis [facility])    "TTS; don't remember street name" (05/03/2014)  . Hepatitis C   . HIV INFECTION 06/27/2010   Qualifier: Diagnosis of  By: Nickola Major CMA ( [place]), Geni Bers    . Hypotension 06/02/2012  . Metabolic bone disease 08/12/6832  . MRSA infection   . Normocytic anemia 06/17/2012  . Pancreatitis   . Pressure ulcer of BKA stump, stage 2 ([place]) 11/22/2014  . Renal disorder   . Severe protein-calorie malnutrition ([place]) 06/17/2012  . Uncontrolled diabetes mellitus with complications ([place]) 1/96/2229   Annotation: uncontrolled Qualifier: Diagnosis of  By: Nickola Major CMA Deborra Medina), Geni Bers      Social History   Tobacco Use  . Smoking status: Never Smoker  . Smokeless tobacco: Never Used  Substance Use Topics  . Alcohol use: No  . Drug use: No    Family History  Problem Relation Age of Onset  . Diabetes Mother   . Diabetes Father    No Known Allergies  OBJECTIVE: Blood pressure (!) 108/57, pulse 80, temperature 98.5 F (36.9 C), temperature source Oral, resp. rate 17, height 5\' 1"  (1.549 m), weight 53.7 kg, SpO2 100 %.  Physical Exam Constitutional:      General: He is not in acute distress.    Appearance: Normal appearance. He is well-developed. He is not ill-appearing or diaphoretic.  HENT:     Head: Normocephalic and atraumatic.     Right Ear: Hearing and external ear normal.       Left Ear: Hearing and external ear normal.     Nose: No nasal deformity or rhinorrhea.  Eyes:     General: No scleral icterus.    Conjunctiva/sclera: Conjunctivae normal.     Right eye: Right conjunctiva is not injected.     Left eye: Left conjunctiva is not injected.  Neck:     Musculoskeletal: Normal range of motion and neck supple.     Vascular: No JVD.  Cardiovascular:     Rate and Rhythm: Normal rate and regular rhythm.     Heart sounds: Normal heart sounds, S1 normal and S2 normal. No murmur. No friction rub. No gallop.   Pulmonary:     Effort: Pulmonary effort is normal. No respiratory distress.     Breath sounds: Normal breath sounds. No stridor. No wheezing, rhonchi or rales.  Abdominal:     General: Bowel sounds are normal. There is no distension.     Palpations: Abdomen is soft. There is no mass.     Tenderness: There  is no abdominal tenderness.  Musculoskeletal: Normal range of motion.     Right shoulder: Normal.     Left shoulder: Normal.     Right hip: Normal.     Left hip: Normal.     Right knee: Normal.     Left knee: Normal.  Lymphadenopathy:     Head:     Right side of head: No submandibular, preauricular or posterior auricular adenopathy.     Left side of head: No submandibular, preauricular or posterior auricular adenopathy.     Cervical: No cervical adenopathy.     Right cervical: No superficial or deep cervical adenopathy.    Left cervical: No superficial or deep cervical adenopathy.  Skin:    General: Skin is warm and dry.     Coloration: Skin is not pale.     Findings: No abrasion, bruising, ecchymosis, lesion or rash.     Nails: There is no clubbing.   Neurological:     General: No focal deficit present.     Mental Status: He is alert and oriented to person, place, and time.     Sensory: No sensory deficit.     Gait: Gait normal.  Psychiatric:        Attention and Perception: He is attentive.        Mood and Affect: Mood normal.         Speech: Speech normal.        Behavior: Behavior normal. Behavior is cooperative.        Thought Content: Thought content normal.        Judgment: Judgment normal.    Still does not appear overtly infected and is being accessed for dialysis while we examine him.  Left BKA site is clean       right lower extremity pictured below where the 10/31/2018:       Lab Results Lab Results  Component Value Date   WBC 11.6 (H) 06/18/2018   HGB 9.5 (L) 06/18/2018   HCT 29.1 (L) 06/18/2018   MCV 99.0 06/18/2018   PLT 174 06/18/2018    Lab Results  Component Value Date   CREATININE 10.67 (H) 06/18/2018   BUN 44 (H) 06/18/2018   NA 138 06/18/2018   K 5.3 (H) 06/18/2018   CL 101 06/18/2018   CO2 20 (L) 06/18/2018    Lab Results  Component Value Date   ALT 13 06/16/2018   AST 25 06/16/2018   ALKPHOS 76 06/16/2018   BILITOT 0.6 06/16/2018     Microbiology: Recent Results (from the past 240 hour(s))  Blood Culture (routine x 2)     Status: None (Preliminary result)   Collection Time: 06/17/18  2:14 AM  Result Value Ref Range Status   Specimen Description BLOOD RIGHT ARM  Final   Special Requests   Final    BOTTLES DRAWN AEROBIC AND ANAEROBIC Blood Culture adequate volume Performed at [facility], [place] [address], [place], [place] [address]    Culture  Setup Time   Final    GRAM POSITIVE COCCI AEROBIC BOTTLE ONLY CRITICAL VALUE NOTED.  VALUE IS CONSISTENT WITH PREVIOUSLY REPORTED AND CALLED VALUE. Performed at [facility], [place] [address] [place], [place] [address]    Culture GRAM POSITIVE COCCI  Final   Report Status PENDING  Incomplete  Blood Culture (routine x 2)     Status: None (Preliminary result)   Collection Time: 06/17/18  2:14 AM  Result Value Ref Range Status  Specimen Description   Final    BLOOD RIGHT HAND Performed at [place] [address], [place], [place] [address]    Special Requests   Final     BOTTLES DRAWN AEROBIC AND ANAEROBIC Blood Culture adequate volume Performed at [place] [address], [place], [place] [address]    Culture  Setup Time   Final    ANAEROBIC BOTTLE ONLY GRAM POSITIVE COCCI Organism ID to follow CRITICAL RESULT CALLED TO, READ BACK BY AND VERIFIED WITH: C AMEND [facility] 06/18/18 0135 JDW Performed at [facility], [place] [address] [place], [place] [address]    Culture GRAM POSITIVE COCCI  Final   Report Status PENDING  Incomplete  Blood Culture ID Panel (Reflexed)     Status: Abnormal   Collection Time: 06/17/18  2:14 AM  Result Value Ref Range Status   Enterococcus species NOT DETECTED NOT DETECTED Final   Listeria monocytogenes NOT DETECTED NOT DETECTED Final   Staphylococcus species DETECTED (A) NOT DETECTED Final    Comment: CRITICAL RESULT CALLED TO, READ BACK BY AND VERIFIED WITH: C AMEND PHARMD 06/18/18 0133 JDW    Staphylococcus aureus (BCID) DETECTED (A) NOT DETECTED Final    Comment: Methicillin (oxacillin)-resistant Staphylococcus aureus (MRSA). MRSA is predictably resistant to beta-lactam antibiotics (except ceftaroline). Preferred therapy is vancomycin unless clinically contraindicated. Patient requires contact precautions if  hospitalized. CRITICAL RESULT CALLED TO, READ BACK BY AND VERIFIED WITH: C AMEND PHARMD 06/18/18 0133 JDW    Methicillin resistance DETECTED (A) NOT DETECTED Final    Comment: CRITICAL RESULT CALLED TO, READ BACK BY AND VERIFIED WITH: C AMEND PHARMD 06/18/18 0133 JDW    Streptococcus species NOT DETECTED NOT DETECTED Final   Streptococcus agalactiae NOT DETECTED NOT DETECTED Final   Streptococcus pneumoniae NOT DETECTED NOT DETECTED Final   Streptococcus pyogenes NOT DETECTED NOT DETECTED Final   Acinetobacter baumannii NOT DETECTED NOT DETECTED Final   Enterobacteriaceae species NOT DETECTED NOT DETECTED Final   Enterobacter cloacae complex NOT DETECTED NOT DETECTED Final   Escherichia coli  NOT DETECTED NOT DETECTED Final   Klebsiella oxytoca NOT DETECTED NOT DETECTED Final   Klebsiella pneumoniae NOT DETECTED NOT DETECTED Final   Proteus species NOT DETECTED NOT DETECTED Final   Serratia marcescens NOT DETECTED NOT DETECTED Final   Haemophilus influenzae NOT DETECTED NOT DETECTED Final   Neisseria meningitidis NOT DETECTED NOT DETECTED Final   Pseudomonas aeruginosa NOT DETECTED NOT DETECTED Final   Candida albicans NOT DETECTED NOT DETECTED Final   Candida glabrata NOT DETECTED NOT DETECTED Final   Candida krusei NOT DETECTED NOT DETECTED Final   Candida parapsilosis NOT DETECTED NOT DETECTED Final   Candida tropicalis NOT DETECTED NOT DETECTED Final    Comment: Performed at [facility], [place]. [address] [place], [place] [address]    Alcide Evener, [facility] 06/18/2018, 11:14 AM

## 2018-06-18 NOTE — Progress Notes (Addendum)
Inpatient Diabetes Program Recommendations  AACE/ADA: New Consensus Statement on Inpatient Glycemic Control (2015)  Target Ranges:  Prepandial:   less than 140 mg/dL      Peak postprandial:   less than 180 mg/dL (1-2 hours)      Critically ill patients:  140 - 180 mg/dL   Lab Results  Component Value Date   GLUCAP 108 (H) 06/18/2018   HGBA1C 13.4 (H) 11/19/2017    Review of Glycemic Control  Diabetes history: DM 2 Outpatient Diabetes medications: None Current orders for Inpatient glycemic control: Novolog Moderate Correction 0-15 units tid, Novolog 0-5 units qhs  Inpatient Diabetes Program Recommendations:    Hgb 9.5 this admission Glucose 356/310 on presentation  Patient is ESRD and has infectious process  Patient has been on 70/30 insulin in the past. Once reported in 05/2016 that he hade been taking insulin for 14 years.  Will watch patient on current regimen and will follow trends (possibly may need a more sensitive Novolog scale in the future due to renal function).  Dosing of DM medications will be more accurate once infectious process clears.  Thanks,  Tama Headings RN, MSN, BC-ADM Inpatient Diabetes Coordinator Team Pager [phone] (8a-5p)

## 2018-06-18 NOTE — Progress Notes (Signed)
Pharmacy Antibiotic Note  Alvin Daniels is a 48 y.o. male admitted on 06/16/2018 with  Sepsis. Pharmacy asked to manage Vancomycin and Cefepime. Pt also on Flagyl. Pt is ESRD with HD on T/T/S as outpatient. Loaded with Vancomycin 1250mg  IV at [facility]  2/6 BCID call from lab and pt growing MRSA in 1/4 blood cultures.  Plan: Vancomycin 500mg  IV qHD (T/T/S) Continue Cefepime 1gm q24h Follow HD schedule, cultures and clinical course Vanc pre-HD levels prn  Height: 5\' 1"  (154.9 cm) Weight: 120 lb 6.4 oz (54.6 kg) IBW/kg (Calculated) : 52.3  Temp (24hrs), Avg:100.1 F (37.8 C), Min:99.1 F (37.3 C), Max:103 F (39.4 C)  Recent Labs  Lab 06/16/18 2300 06/17/18 0138 06/17/18 0500  WBC 9.2  --   --   CREATININE 7.43*  --   --   LATICACIDVEN  --  2.9* 0.8    Estimated Creatinine Clearance: 9.1 mL/min (A) (by C-G formula based on SCr of 7.43 mg/dL (H)).    No Known Allergies  Antimicrobials this admission: 2/5 vanc >> 2/5 cefepime >> 2/5 flagyl >>  Microbiology results: 2/5 BCx: 1/4 MRSA  PHARMACY - PHYSICIAN COMMUNICATION CRITICAL VALUE ALERT - BLOOD CULTURE IDENTIFICATION (BCID)  Assessment:  1/4 blood cultures with MRSA  Name of physician (or Provider) Contacted: Forrest Moron  Current antibiotics: Cefepime, Vanc, Flagyl  Changes to prescribed antibiotics recommended:  Response not received from provider;  current antibiotics are likely to cover the isolated organism.  Consider de-escalation soon.  Results for orders placed or performed during the hospital encounter of 06/16/18  Blood Culture ID Panel (Reflexed) (Collected: 06/17/2018  2:14 AM)  Result Value Ref Range   Enterococcus species NOT DETECTED NOT DETECTED   Listeria monocytogenes NOT DETECTED NOT DETECTED   Staphylococcus species DETECTED (A) NOT DETECTED   Staphylococcus aureus (BCID) DETECTED (A) NOT DETECTED   Methicillin resistance DETECTED (A) NOT DETECTED   Streptococcus species NOT  DETECTED NOT DETECTED   Streptococcus agalactiae NOT DETECTED NOT DETECTED   Streptococcus pneumoniae NOT DETECTED NOT DETECTED   Streptococcus pyogenes NOT DETECTED NOT DETECTED   Acinetobacter baumannii NOT DETECTED NOT DETECTED   Enterobacteriaceae species NOT DETECTED NOT DETECTED   Enterobacter cloacae complex NOT DETECTED NOT DETECTED   Escherichia coli NOT DETECTED NOT DETECTED   Klebsiella oxytoca NOT DETECTED NOT DETECTED   Klebsiella pneumoniae NOT DETECTED NOT DETECTED   Proteus species NOT DETECTED NOT DETECTED   Serratia marcescens NOT DETECTED NOT DETECTED   Haemophilus influenzae NOT DETECTED NOT DETECTED   Neisseria meningitidis NOT DETECTED NOT DETECTED   Pseudomonas aeruginosa NOT DETECTED NOT DETECTED   Candida albicans NOT DETECTED NOT DETECTED   Candida glabrata NOT DETECTED NOT DETECTED   Candida krusei NOT DETECTED NOT DETECTED   Candida parapsilosis NOT DETECTED NOT DETECTED   Candida tropicalis NOT DETECTED NOT DETECTED    Sindy Guadeloupe 06/18/2018  1:39 AM

## 2018-06-18 NOTE — Progress Notes (Signed)
[facility] PROGRESS NOTE  Alvin Daniels TMH:[id number] DOB: 06-28-1970 DOA: 06/16/2018 PCP: [name], MD  Assessment/Plan:  Sepsis. Max temp 103, tachypnea, tachycardia, lactic acidosis and hypotension. 1/2 blood culture +MRSA. Has pressure ulcer on right foot as well. He received IV fluids 30 cc/kg blood pressure improved to 92J systolic which is close to his baseline. Midodrine initiated. Evaluated by ID who recommend 6 weeks antibiotics -continue vanc day #2 -repeat blood culture -plain film of right foot -follow echo  Bacteremia. 1/2 blood cultures + MRSA. See above -appreciate assistance with ID -repeat blood cultures -vanc per ID  Right foot pressure ulcer. Hx of same. Reports worsened as he has been "standing" on it more -plain films -wound care consult  Active Problems End-stage renal disease. Tuesday Thursday sat schedule. Dialyzed today. Potassium 5.2 and creatinine greater 10 this am -continue home med -dialysis per nephrology -will need 6 weeks vancomycin via dialysis  Chest pain. Resolved this am. No cardiac history. Troponin 0.03 x2.  -monitor  HIV / chronic Hep C -Most recent CD4 count 270 in July 2019, HIV RNA quantitative undetectable -followed by Dr. Linus Salmons as an outpatient  Type 2 diabetes mellitus. HgA1c 13.4 in July last year. chart review indicates once on oral agent. He denies needing medication for diabtes. Serum glucose 205 -SSI for optimal control -obtain A1c -carb modified diet -diabetes coordinator consult    Code Status: full Family Communication: none present Disposition Plan: home when ready   Consultants:  vann dam ID  [facility] nephrology  Procedures:  echo  Antibiotics:  Vancomycin 06/17/18>>  Cefepime 06/17/18-06/18/18  HPI/Subjective: 48 yo hx esrd on dialysis, hiv, chronic hep c, admitted sepsis 1/2 MRSA+ blood cultures. Source unclear but concern right foot ulcer. 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  Respiratory: normal effort BS clear bilaterally no wheeze  Abdomen: flat soft + BS no guarding or rebounding  Musculoskeletal: joints without swelling/erythema   Data Reviewed: Basic Metabolic Panel: Recent Labs  Lab 06/16/18 2300 06/18/18 0647  NA 135 138  K 3.9 5.3*  CL 91* 101  CO2 30 20*  GLUCOSE 152* 205*  BUN 27* 44*  CREATININE 7.43* 10.67*  CALCIUM 8.2* 8.2*  PHOS  --  7.1*   Liver Function Tests: Recent Labs  Lab 06/16/18 2302 06/18/18 0647  AST 25  --   ALT 13  --   ALKPHOS 76  --   BILITOT 0.6  --   PROT 8.9*  --   ALBUMIN 3.2* 2.3*   No results for input(s): LIPASE, AMYLASE in the last 168 hours. No results for input(s): AMMONIA in the last 168 hours. CBC: Recent Labs  Lab 06/16/18 2300 06/18/18 0647  WBC 9.2 11.6*  NEUTROABS 7.0  --   HGB 10.5* 9.5*  HCT 33.1* 29.1*  MCV  100.6* 99.0  PLT 189 174   Cardiac Enzymes: Recent Labs  Lab 06/17/18 0830 06/17/18 1450  TROPONINI <0.03 <0.03   BNP (last 3 results) No results for input(s): BNP in the last 8760 hours.  ProBNP (last 3 results) No results for input(s): PROBNP in the last 8760 hours.  CBG: Recent Labs  Lab 06/17/18 1827 06/17/18 2118 06/17/18 2223 06/18/18 0727 06/18/18 1244  GLUCAP 310* 78 99 180* 108*    Recent Results (from the past 240 hour(s))  Blood Culture (routine x 2)      Status: None (Preliminary result)   Collection Time: 06/17/18  2:14 AM  Result Value Ref Range Status   Specimen Description BLOOD RIGHT ARM  Final   Special Requests   Final    BOTTLES DRAWN AEROBIC AND ANAEROBIC Blood Culture adequate volume Performed at [facility] [address], [place], [place] [address]    Culture  Setup Time   Final    GRAM POSITIVE COCCI AEROBIC BOTTLE ONLY CRITICAL VALUE NOTED.  VALUE IS CONSISTENT WITH PREVIOUSLY REPORTED AND CALLED VALUE. Performed at [facility], [place] [address] [place], [place] [address]    Culture GRAM POSITIVE COCCI  Final   Report Status PENDING  Incomplete  Blood Culture (routine x 2)     Status: None (Preliminary result)   Collection Time: 06/17/18  2:14 AM  Result Value Ref Range Status   Specimen Description   Final    BLOOD RIGHT HAND Performed at [facility] [address] [place], [place] [address]    Special Requests   Final    BOTTLES DRAWN AEROBIC AND ANAEROBIC Blood Culture adequate volume Performed at [facility] [address] [place], [place] [address]    Culture  Setup Time   Final    ANAEROBIC BOTTLE ONLY GRAM POSITIVE COCCI Organism ID to follow CRITICAL RESULT CALLED TO, READ BACK BY AND VERIFIED WITH: C AMEND [facility] 06/18/18 0135 JDW Performed at [facility], [place] [address] [place], [place] [address]    Culture GRAM POSITIVE COCCI  Final   Report Status PENDING  Incomplete  Blood Culture ID Panel (Reflexed)     Status: Abnormal   Collection Time: 06/17/18  2:14 AM  Result Value Ref Range Status   Enterococcus species NOT DETECTED NOT DETECTED Final   Listeria monocytogenes NOT DETECTED NOT DETECTED Final   Staphylococcus species DETECTED (A) NOT DETECTED Final    Comment: CRITICAL RESULT CALLED TO, READ BACK BY AND VERIFIED WITH: C AMEND PHARMD 06/18/18 0133 JDW    Staphylococcus aureus (BCID) DETECTED (A) NOT DETECTED Final     Comment: Methicillin (oxacillin)-resistant Staphylococcus aureus (MRSA). MRSA is predictably resistant to beta-lactam antibiotics (except ceftaroline). Preferred therapy is vancomycin unless clinically contraindicated. Patient requires contact precautions if  hospitalized. CRITICAL RESULT CALLED TO, READ BACK BY AND VERIFIED WITH: C AMEND PHARMD 06/18/18 0133 JDW    Methicillin resistance DETECTED (A) NOT DETECTED Final    Comment: CRITICAL RESULT CALLED TO, READ BACK BY AND VERIFIED WITH: C AMEND PHARMD 06/18/18 0133 JDW    Streptococcus species NOT DETECTED NOT DETECTED Final   Streptococcus agalactiae NOT DETECTED NOT DETECTED Final   Streptococcus pneumoniae NOT DETECTED NOT DETECTED Final   Streptococcus pyogenes NOT DETECTED NOT DETECTED Final   Acinetobacter baumannii NOT DETECTED NOT DETECTED Final   Enterobacteriaceae species NOT DETECTED NOT DETECTED Final   Enterobacter cloacae complex NOT DETECTED NOT DETECTED Final  Escherichia coli NOT DETECTED NOT DETECTED Final   Klebsiella oxytoca NOT DETECTED NOT DETECTED Final   Klebsiella pneumoniae NOT DETECTED NOT DETECTED Final   Proteus species NOT DETECTED NOT DETECTED Final   Serratia marcescens NOT DETECTED NOT DETECTED Final   Haemophilus influenzae NOT DETECTED NOT DETECTED Final   Neisseria meningitidis NOT DETECTED NOT DETECTED Final   Pseudomonas aeruginosa NOT DETECTED NOT DETECTED Final   Candida albicans NOT DETECTED NOT DETECTED Final   Candida glabrata NOT DETECTED NOT DETECTED Final   Candida krusei NOT DETECTED NOT DETECTED Final   Candida parapsilosis NOT DETECTED NOT DETECTED Final   Candida tropicalis NOT DETECTED NOT DETECTED Final    Comment: Performed at [facility], [place] [address] [place], [place] [address]     Studies: Dg Chest 2 View  Result Date: 06/16/2018 CLINICAL DATA:  Mid chest pain and weakness in a dialysis patient. EXAM: CHEST - 2 VIEW COMPARISON:  08/15/2016 FINDINGS: Shallow  inspiration. Normal heart size and pulmonary vascularity. No focal airspace disease or consolidation in the lungs. No blunting of costophrenic angles. No pneumothorax. Mediastinal contours appear intact. Interval removal of right central venous catheter since previous study. Healing fracture of the midshaft left clavicle, new since prior study. IMPRESSION: No evidence of active pulmonary disease. Healing fracture of the midshaft left clavicle. Electronically Signed   By: Lucienne Capers M.D.   On: 06/16/2018 23:34    Scheduled Meds: . bictegravir-emtricitabine-tenofovir AF  1 tablet Oral Daily  . doxercalciferol  3 mcg Intravenous Q T,Th,Sa-HD  . heparin  5,000 Units Subcutaneous Q8H  . insulin aspart  0-15 Units Subcutaneous TID WC  . insulin aspart  0-5 Units Subcutaneous QHS  . midodrine  5 mg Oral TID WC  . vancomycin  500 mg Intravenous Q T,Th,Sa-HD   Continuous Infusions:  Principal Problem:   Bacteremia Active Problems:   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)   Hypotension   ESRD on dialysis ([name])   Sepsis ([place])   Pressure ulcer of right foot   HIV (human immunodeficiency virus infection) ([place])   Chronic hepatitis C without hepatic coma ([place])    Time spent: 55 minutes   Sharp NP  [facility]  If 7PM-7AM, please contact night-coverage at [email or web address], password [facility] 06/18/2018, 1:15 PM  LOS: 1 day

## 2018-06-18 NOTE — Procedures (Addendum)
Patient was seen on dialysis and the procedure was supervised.  BFR 400  Via AVF BP is  98/66. UF goal only 0.5 L, ordered a dose of midodrine. Change to 2K bath. 1/2BC positive for MRSA. On abx, echo pending. AVF site looks clean.   Patient appears to be tolerating treatment well.  Alvin Daniels 06/18/2018

## 2018-06-19 ENCOUNTER — Inpatient Hospital Stay (HOSPITAL_COMMUNITY): Payer: Medicaid Other

## 2018-06-19 DIAGNOSIS — E11621 Type 2 diabetes mellitus with foot ulcer: Secondary | ICD-10-CM

## 2018-06-19 DIAGNOSIS — B20 Human immunodeficiency virus [HIV] disease: Secondary | ICD-10-CM

## 2018-06-19 DIAGNOSIS — B9562 Methicillin resistant Staphylococcus aureus infection as the cause of diseases classified elsewhere: Secondary | ICD-10-CM

## 2018-06-19 DIAGNOSIS — R7881 Bacteremia: Secondary | ICD-10-CM

## 2018-06-19 DIAGNOSIS — B182 Chronic viral hepatitis C: Secondary | ICD-10-CM

## 2018-06-19 LAB — BASIC METABOLIC PANEL
Anion gap: 22 — ABNORMAL HIGH (ref 5–15)
BUN: 31 mg/dL — ABNORMAL HIGH (ref 6–20)
CO2: 23 mmol/L (ref 22–32)
Calcium: 8.4 mg/dL — ABNORMAL LOW (ref 8.9–10.3)
Chloride: 91 mmol/L — ABNORMAL LOW (ref 98–111)
Creatinine, Ser: 7.64 mg/dL — ABNORMAL HIGH (ref 0.61–1.24)
GFR calc Af Amer: 9 mL/min — ABNORMAL LOW (ref >60)
GFR calc non Af Amer: 8 mL/min — ABNORMAL LOW (ref >60)
GLUCOSE: 293 mg/dL — AB (ref 70–99)
Potassium: 4.5 mmol/L (ref 3.5–5.1)
Sodium: 136 mmol/L (ref 135–145)

## 2018-06-19 LAB — RENAL FUNCTION PANEL
Albumin: 2.3 g/dL — ABNORMAL LOW (ref 3.5–5.0)
Anion gap: 18 — ABNORMAL HIGH (ref 5–15)
BUN: 44 mg/dL — ABNORMAL HIGH (ref 6–20)
CO2: 25 mmol/L (ref 22–32)
Calcium: 8.6 mg/dL — ABNORMAL LOW (ref 8.9–10.3)
Chloride: 92 mmol/L — ABNORMAL LOW (ref 98–111)
Creatinine, Ser: 8.8 mg/dL — ABNORMAL HIGH (ref 0.61–1.24)
GFR calc Af Amer: 7 mL/min — ABNORMAL LOW (ref >60)
GFR calc non Af Amer: 6 mL/min — ABNORMAL LOW (ref >60)
Glucose, Bld: 290 mg/dL — ABNORMAL HIGH (ref 70–99)
Phosphorus: 5.5 mg/dL — ABNORMAL HIGH (ref 2.5–4.6)
Potassium: 4.2 mmol/L (ref 3.5–5.1)
Sodium: 135 mmol/L (ref 135–145)

## 2018-06-19 LAB — CBC
HCT: 29.8 % — ABNORMAL LOW (ref 39.0–52.0)
Hemoglobin: 9.8 g/dL — ABNORMAL LOW (ref 13.0–17.0)
MCH: 31.2 pg (ref 26.0–34.0)
MCHC: 32.9 g/dL (ref 30.0–36.0)
MCV: 94.9 fL (ref 80.0–100.0)
PLATELETS: 259 10*3/uL (ref 150–400)
RBC: 3.14 MIL/uL — AB (ref 4.22–5.81)
RDW: 14.1 % (ref 11.5–15.5)
WBC: 11.7 10*3/uL — ABNORMAL HIGH (ref 4.0–10.5)
nRBC: 0 % (ref 0.0–0.2)

## 2018-06-19 LAB — GLUCOSE, CAPILLARY
GLUCOSE-CAPILLARY: 325 mg/dL — AB (ref 70–99)
Glucose-Capillary: 249 mg/dL — ABNORMAL HIGH (ref 70–99)
Glucose-Capillary: 261 mg/dL — ABNORMAL HIGH (ref 70–99)
Glucose-Capillary: 266 mg/dL — ABNORMAL HIGH (ref 70–99)

## 2018-06-19 MED ORDER — HEPARIN SODIUM (PORCINE) 1000 UNIT/ML DIALYSIS: 1000.0000 [IU] | INTRAMUSCULAR | Status: DC | PRN

## 2018-06-19 MED ORDER — LIDOCAINE HCL (PF) 1 % IJ SOLN: 5.0000 mL | INTRAMUSCULAR | Status: DC | PRN

## 2018-06-19 MED ORDER — SODIUM CHLORIDE 0.9 % IV SOLN: 100.0000 mL | INTRAVENOUS | Status: DC | PRN

## 2018-06-19 MED ORDER — LIDOCAINE-PRILOCAINE 2.5-2.5 % EX CREA: 1.0000 "application " | TOPICAL_CREAM | CUTANEOUS | Status: DC | PRN

## 2018-06-19 MED ORDER — CHLORHEXIDINE GLUCONATE CLOTH 2 % EX PADS
6.0000 | MEDICATED_PAD | Freq: Every day | CUTANEOUS | Status: DC
  Administered 2018-06-22 – 2018-06-23 (×2): 6 via TOPICAL

## 2018-06-19 MED ORDER — PENTAFLUOROPROP-TETRAFLUOROETH EX AERO: 1.0000 "application " | INHALATION_SPRAY | CUTANEOUS | Status: DC | PRN

## 2018-06-19 MED ORDER — HEPARIN SODIUM (PORCINE) 1000 UNIT/ML DIALYSIS: 2000.0000 [IU] | Freq: Once | INTRAMUSCULAR | Status: DC

## 2018-06-19 MED ORDER — SODIUM CHLORIDE 0.9 % IV SOLN
INTRAVENOUS | Status: DC
  Administered 2018-06-21 – 2018-06-22 (×2): via INTRAVENOUS

## 2018-06-19 MED ORDER — ALTEPLASE 2 MG IJ SOLR: 2.0000 mg | Freq: Once | INTRAMUSCULAR | Status: DC | PRN

## 2018-06-19 MED ORDER — OXYCODONE HCL 5 MG PO TABS
5.0000 mg | ORAL_TABLET | Freq: Four times a day (QID) | ORAL | Status: DC | PRN
  Administered 2018-06-21 – 2018-06-23 (×2): 5 mg via ORAL
  Filled 2018-06-19 (×2): qty 1

## 2018-06-19 MED ORDER — FERRIC CITRATE 1 GM 210 MG(FE) PO TABS
420.0000 mg | ORAL_TABLET | Freq: Three times a day (TID) | ORAL | Status: DC
  Administered 2018-06-19 – 2018-06-23 (×9): 420 mg via ORAL
  Filled 2018-06-19 (×14): qty 2

## 2018-06-19 MED ORDER — ACETAMINOPHEN 325 MG PO TABS
650.0000 mg | ORAL_TABLET | Freq: Four times a day (QID) | ORAL | Status: DC | PRN
  Administered 2018-06-19 – 2018-06-20 (×3): 650 mg via ORAL
  Filled 2018-06-19 (×3): qty 2

## 2018-06-19 NOTE — Progress Notes (Signed)
Inpatient Diabetes Program Recommendations  AACE/ADA: New Consensus Statement on Inpatient Glycemic Control (2015)  Target Ranges:  Prepandial:   less than 140 mg/dL      Peak postprandial:   less than 180 mg/dL (1-2 hours)      Critically ill patients:  140 - 180 mg/dL   Results for EDON, HOADLEY (MRN [id number]) as of 06/19/2018 08:52  Ref. Range 06/17/2018 17:20 06/17/2018 18:27 06/17/2018 21:18 06/17/2018 22:23  Glucose-Capillary Latest Ref Range: 70 - 99 mg/dL 356 (H) 310 (H) 78 99   Results for DONTAVION, NOXON (MRN [id number]) as of 06/19/2018 08:52  Ref. Range 06/18/2018 07:27 06/18/2018 12:44 06/18/2018 16:28 06/18/2018 21:39 06/19/2018 07:44  Glucose-Capillary Latest Ref Range: 70 - 99 mg/dL 180 (H) 108 (H) 148 (H) 295 (H) 249 (H)    Review of Glycemic Control  Diabetes history: DM 2 Outpatient Diabetes medications: None Current orders for Inpatient glycemic control: Novolog Moderate Correction 0-15 units tid, Novolog 0-5 units qhs  Inpatient Diabetes Program Recommendations:    Hgb 9.5 this admission Glucose 356/310 on presentation  Patient is ESRD and has infectious process  Glucose is in the 200's this am, Consider Levemir 5 units Daily.  Patient would benefit from insulin at time of discharge. Patient has been on 70/30 insulin in the past.  Thanks,  Tama Headings RN, MSN, BC-ADM Inpatient Diabetes Coordinator Team Pager [phone] (8a-5p)

## 2018-06-19 NOTE — Progress Notes (Signed)
    [facility] has been requested to perform a transesophageal echocardiogram on Mr. Silvers for bacteremia.  After careful review of history and examination, the risks and benefits of transesophageal echocardiogram have been explained including risks of esophageal damage, perforation (1:10,000 risk), bleeding, pharyngeal hematoma as well as other potential complications associated with conscious sedation including aspiration, arrhythmia, respiratory failure and death. Alternatives to treatment were discussed, questions were answered. Patient is willing to proceed. Video language line used for conversation.  TEE - Dr. Marlou Porch 06/22/18 @ 1400  . NPO after midnight. Meds with sips.   Leanor Kail, PA-C 06/19/2018 4:46 PM

## 2018-06-19 NOTE — Progress Notes (Signed)
Subjective: No new complaints, he is concerned about obtaining the new prosthesis that his friend is helping him pay for with an appointment that he has at 8 AM on Wednesday next week.   Antibiotics:  Anti-infectives (From admission, onward)   Start     Dose/Rate Route Frequency Ordered Stop   06/18/18 1200  vancomycin (VANCOCIN) IVPB 500 mg/100 ml premix     500 mg 100 mL/hr over 60 Minutes Intravenous Every T-Th-Sa (Hemodialysis) 06/18/18 0141     06/18/18 0940  vancomycin (VANCOCIN) 500-5 MG/100ML-% IVPB    Note to Pharmacy:  Rodell Perna   : cabinet override      06/18/18 0940 06/18/18 1116   06/18/18 0200  ceFEPIme (MAXIPIME) 1 g in sodium chloride 0.9 % 100 mL IVPB  Status:  Discontinued     1 g 200 mL/hr over 30 Minutes Intravenous Every 24 hours 06/17/18 0745 06/18/18 0215   06/17/18 1500  bictegravir-emtricitabine-tenofovir AF (BIKTARVY) 50-200-25 MG per tablet 1 tablet     1 tablet Oral Daily 06/17/18 0819     06/17/18 0743  vancomycin variable dose per unstable renal function (pharmacist dosing)  Status:  Discontinued      Does not apply See admin instructions 06/17/18 0745 06/18/18 0141   06/17/18 0145  ceFEPIme (MAXIPIME) 2 g in sodium chloride 0.9 % 100 mL IVPB     2 g 200 mL/hr over 30 Minutes Intravenous  Once 06/17/18 0138 06/17/18 0309   06/17/18 0145  metroNIDAZOLE (FLAGYL) IVPB 500 mg  Status:  Discontinued     500 mg 100 mL/hr over 60 Minutes Intravenous Every 8 hours 06/17/18 0138 06/18/18 0215   06/17/18 0145  vancomycin (VANCOCIN) IVPB 1000 mg/200 mL premix  Status:  Discontinued     1,000 mg 200 mL/hr over 60 Minutes Intravenous  Once 06/17/18 0138 06/17/18 0143   06/17/18 0145  vancomycin (VANCOCIN) 1,250 mg in sodium chloride 0.9 % 250 mL IVPB     1,250 mg 166.7 mL/hr over 90 Minutes Intravenous  Once 06/17/18 0143 06/17/18 0656      Medications: Scheduled Meds: . bictegravir-emtricitabine-tenofovir AF  1 tablet Oral Daily  . [START ON  06/20/2018] Chlorhexidine Gluconate Cloth  6 each Topical Q0600  . doxercalciferol  3 mcg Intravenous Q T,Th,Sa-HD  . ferric citrate  420 mg Oral TID WC  . heparin  5,000 Units Subcutaneous Q8H  . insulin aspart  0-15 Units Subcutaneous TID WC  . insulin aspart  0-5 Units Subcutaneous QHS  . midodrine  5 mg Oral TID WC  . vancomycin  500 mg Intravenous Q T,Th,Sa-HD   Continuous Infusions: PRN Meds:.ondansetron (ZOFRAN) IV    Objective: Weight change: -0.913 kg  Intake/Output Summary (Last 24 hours) at 06/19/2018 1558 Last data filed at 06/19/2018 0742 Gross per 24 hour  Intake 864 ml  Output -  Net 864 ml   Blood pressure 132/79, pulse 90, temperature 98.5 F (36.9 C), temperature source Oral, resp. rate 17, height 5\' 1"  (1.549 m), weight 53.2 kg, SpO2 97 %. Temp:  [97.6 F (36.4 C)-99.4 F (37.4 C)] 98.5 F (36.9 C) (02/07 1333) Pulse Rate:  [90-99] 90 (02/07 0503) Resp:  [17-18] 17 (02/07 0032) BP: (98-132)/(57-79) 132/79 (02/07 1333) SpO2:  [97 %-100 %] 97 % (02/07 0503)  Physical Exam: General: Alert and awake, oriented x3, not in any acute distress. HEENT: anicteric sclera, EOMI CVS regular rate, normal no murmurs gallops or rubs Chest: , no  wheezing, no respiratory distress Abdomen: soft non-distended,  Extremities: Right lower extremity is unchanged Skin: Fistula site is clean Neuro: nonfocal  CBC:    BMET Recent Labs    06/18/18 0647 06/19/18 0430  NA 138 136  K 5.3* 4.5  CL 101 91*  CO2 20* 23  GLUCOSE 205* 293*  BUN 44* 31*  CREATININE 10.67* 7.64*  CALCIUM 8.2* 8.4*     Liver Panel  Recent Labs    06/16/18 2302 06/18/18 0647  PROT 8.9*  --   ALBUMIN 3.2* 2.3*  AST 25  --   ALT 13  --   ALKPHOS 76  --   BILITOT 0.6  --   BILIDIR 0.1  --   IBILI 0.5  --        Sedimentation Rate No results for input(s): ESRSEDRATE in the last 72 hours. C-Reactive Protein No results for input(s): CRP in the last 72 hours.  Micro  Results: Recent Results (from the past 720 hour(s))  Blood Culture (routine x 2)     Status: Abnormal (Preliminary result)   Collection Time: 06/17/18  2:14 AM  Result Value Ref Range Status   Specimen Description BLOOD RIGHT ARM  Final   Special Requests   Final    BOTTLES DRAWN AEROBIC AND ANAEROBIC Blood Culture adequate volume Performed at [facility], [place] [address] [place], [place] [address]    Culture  Setup Time   Final    GRAM POSITIVE COCCI AEROBIC BOTTLE ONLY CRITICAL VALUE NOTED.  VALUE IS CONSISTENT WITH PREVIOUSLY REPORTED AND CALLED VALUE. Performed at [facility], [place] [address], [place], [place] [address]    Culture STAPHYLOCOCCUS AUREUS (A)  Final   Report Status PENDING  Incomplete  Blood Culture (routine x 2)     Status: Abnormal (Preliminary result)   Collection Time: 06/17/18  2:14 AM  Result Value Ref Range Status   Specimen Description   Final    BLOOD RIGHT HAND Performed at [facility] [address], [place], [place] [address]    Special Requests   Final    BOTTLES DRAWN AEROBIC AND ANAEROBIC Blood Culture adequate volume Performed at [facility] [address], [place], [place] [address]    Culture  Setup Time   Final    ANAEROBIC BOTTLE ONLY GRAM POSITIVE COCCI Organism ID to follow CRITICAL RESULT CALLED TO, READ BACK BY AND VERIFIED WITH: C AMEND PHARMD 06/18/18 0135 JDW    Culture (A)  Final    STAPHYLOCOCCUS AUREUS SUSCEPTIBILITIES TO FOLLOW Performed at [facility], [place] [address] [place], [place] [address]    Report Status PENDING  Incomplete  Blood Culture ID Panel (Reflexed)     Status: Abnormal   Collection Time: 06/17/18  2:14 AM  Result Value Ref Range Status   Enterococcus species NOT DETECTED NOT DETECTED Final   Listeria monocytogenes NOT DETECTED NOT DETECTED Final   Staphylococcus species DETECTED (A) NOT DETECTED Final    Comment: CRITICAL RESULT  CALLED TO, READ BACK BY AND VERIFIED WITH: C AMEND PHARMD 06/18/18 0133 JDW    Staphylococcus aureus (BCID) DETECTED (A) NOT DETECTED Final    Comment: Methicillin (oxacillin)-resistant Staphylococcus aureus (MRSA). MRSA is predictably resistant to beta-lactam antibiotics (except ceftaroline). Preferred therapy is vancomycin unless clinically contraindicated. Patient requires contact precautions if  hospitalized. CRITICAL RESULT CALLED TO, READ BACK BY AND VERIFIED WITH: C AMEND PHARMD 06/18/18 0133 JDW    Methicillin  resistance DETECTED (A) NOT DETECTED Final    Comment: CRITICAL RESULT CALLED TO, READ BACK BY AND VERIFIED WITH: C AMEND PHARMD 06/18/18 0133 JDW    Streptococcus species NOT DETECTED NOT DETECTED Final   Streptococcus agalactiae NOT DETECTED NOT DETECTED Final   Streptococcus pneumoniae NOT DETECTED NOT DETECTED Final   Streptococcus pyogenes NOT DETECTED NOT DETECTED Final   Acinetobacter baumannii NOT DETECTED NOT DETECTED Final   Enterobacteriaceae species NOT DETECTED NOT DETECTED Final   Enterobacter cloacae complex NOT DETECTED NOT DETECTED Final   Escherichia coli NOT DETECTED NOT DETECTED Final   Klebsiella oxytoca NOT DETECTED NOT DETECTED Final   Klebsiella pneumoniae NOT DETECTED NOT DETECTED Final   Proteus species NOT DETECTED NOT DETECTED Final   Serratia marcescens NOT DETECTED NOT DETECTED Final   Haemophilus influenzae NOT DETECTED NOT DETECTED Final   Neisseria meningitidis NOT DETECTED NOT DETECTED Final   Pseudomonas aeruginosa NOT DETECTED NOT DETECTED Final   Candida albicans NOT DETECTED NOT DETECTED Final   Candida glabrata NOT DETECTED NOT DETECTED Final   Candida krusei NOT DETECTED NOT DETECTED Final   Candida parapsilosis NOT DETECTED NOT DETECTED Final   Candida tropicalis NOT DETECTED NOT DETECTED Final    Comment: Performed at [facility], [place]. [address] [place], [place] [address]    Studies/Results: Mr Foot Right Wo  Contrast  Result Date: 06/19/2018 CLINICAL DATA:  Pressure ulcer on the heel of the right foot. MRSA bacteremia. EXAM: MRI OF THE RIGHT FOOT WITHOUT CONTRAST TECHNIQUE: Multiplanar, multisequence MR imaging of the right foot was performed. No intravenous contrast was administered. COMPARISON:  Radiographs dated 06/18/2018 and MRI dated 03/05/2016 FINDINGS: Bones/Joint/Cartilage There is a chronic comminuted fracture of the calcaneus with further distraction of the fragments since the prior study. There are new slight arthritic changes of distal talus. The other bones of the foot demonstrate no significant abnormalities. There is a small ankle effusion and a subtalar joint effusion which extends into the soft tissues of the plantar aspect of the heel through the distracted chronic fracture of the calcaneus. Ligaments Intact. Muscles and Tendons There is fluid in the tendon sheaths of the peroneal tendons extending from proximal to the lateral malleolus to the level of the base of the fifth metatarsal. There is also a fluid collection deep to the abductor digiti minimi tendon along the fifth metatarsal. There is fluid extending from the subtalar joint through the chronic fracture of the calcaneus into the plantar soft tissues along the flexor tendons in the midfoot. Soft tissues There is a superficial soft tissue ulceration on the medial aspect of the heel without deep extension. This does not appear to communicate with the underlying fluid collection from the subtalar joint. IMPRESSION: 1. No findings suggestive of osteomyelitis of the foot or ankle. 2. 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 3. Multiple fluid collections in the foot some of which originate from the subtalar joint and some of which represent tenosynovitis of the peroneal tendons and some of which may represent pseudobursitis deep to the abductor digiti minimi tendon. I doubt that these represent abscesses. However, given the  bacteremia, there is potential for secondary infection of these fluid collections. 4. Superficial soft tissue ulceration of the heel with no underlying abscess or osteomyelitis. Electronically Signed   By: Lorriane Shire M.D.   On: 06/19/2018 13:42   Dg Foot 2 Views Right  Result Date: 06/18/2018 CLINICAL DATA:  Max temp 103, tachypnea, tachycardia, lactic acidosis and hypotension.  1/2 blood culture +MRSA. Has pressure ulcer on right foot as well. EXAM: RIGHT FOOT - 2 VIEW COMPARISON:  MRI, 03/05/2016 FINDINGS: No convincing acute fracture. There are fractures of the calcaneus with associated sclerosis, which were present on the prior MRI. The calcaneal tuberosity is truncated, but smoothly marginated. No areas of bone resorption is seen to suggest acute osteomyelitis. No acute dislocation. There is soft tissue swelling that is most evident over the lateral aspect of the mid to hindfoot. IMPRESSION: 1. Fragmented calcaneus similar to the prior MRI consistent with chronic ununited fractures. 2. There is no bone resorption to suggest osteomyelitis. No acute fractures. 3. Soft tissue swelling which predominates along the mid to hindfoot greater laterally. Electronically Signed   By: Lajean Manes M.D.   On: 06/18/2018 14:22      Assessment/Plan:  INTERVAL HISTORY: MRI does not show osteomyelitis in the ankle but does show some fluid collections.   Principal Problem:   Bacteremia Active Problems:   Diabetes mellitus with ESRD (end-stage renal disease) ([place])   Hypotension   ESRD on dialysis ([place])   Sepsis ([place])   Chronic hepatitis C without hepatic coma ([name])   HIV (human immunodeficiency virus infection) ([place])   Pressure ulcer of right foot    Alvin Daniels is a 48 y.o. male with HIV which is perfectly well controlled, end-stage renal disease on hemodialysis, history of left below the knee amputation, now admitted with MRSA bacteremia and sepsis.  1.  MRSA bacteremia:       [facility] Antimicrobial Management Team Staphylococcus aureus bacteremia   Staphylococcus aureus bacteremia (SAB) is associated with a high rate of complications and mortality.  Specific aspects of clinical management are critical to optimizing the outcome of patients with SAB.  Therefore, the  [facility] Antimicrobial Management Team [facility]) has initiated an intervention aimed at improving the management of SAB at [facility].  To do so, Infectious Diseases physicians are providing an evidence-based consult for the management of all patients with SAB.     Yes No Comments  Perform follow-up blood cultures (even if the patient is afebrile) to ensure clearance of bacteremia [x]  []   blood cultures taken today  Remove vascular catheter and obtain follow-up blood cultures after the removal of the catheter []  []   no catheters in place and fistula does not appear infected  Perform echocardiography to evaluate for endocarditis (transthoracic ECHO is 40-50% sensitive, TEE is > 90% sensitive) [x]  []  Please keep in mind, that neither test can definitively EXCLUDE endocarditis, and that should clinical suspicion remain high for endocarditis the patient should then still be treated with an "endocarditis" duration of therapy = 6 weeks he needs a transesophageal echocardiogram       Ensure source control [x]  []  Have all abscesses been drained effectively? Have deep seeded infections (septic joints or osteomyelitis) had appropriate surgical debridement?  Would ask ORTHO to review MRI findings and ? Needs any intervention from surgical standpoint. I am skeptical he does.   Investigate for "metastatic" sites of infection [x]  []  Does the patient have ANY symptom or physical exam finding that would suggest a deeper infection (back or neck pain that may be suggestive of vertebral osteomyelitis or epidural abscess, muscle pain that could be a symptom of pyomyositis)?  Keep in mind that for deep seeded infections MRI imaging  with contrast is preferred rather than other often insensitive tests such as plain x-rays, especially early in a patient's presentation.  Change antibiotic therapy to  vancomycin []  []  Beta-lactam antibiotics are preferred for MSSA due to higher cure rates.   If on Vancomycin, goal trough should be 15 - 20 mcg/mL  Estimated duration of IV antibiotic therapy:  4-6 weeks with HD []  []  Consult case management for probably prolonged outpatient IV antibiotic therapy   #2 HIV: continue Biktarvy, he has completed HMAP paperwork.  #3 Need for prosthesis: I do find out where he is getting the prosthesis so we can call that clinic in case he is in danger of missing that appointment.  If we are not able accomplish this and he is still without bacteremia on Tuesday and we have gotten his transesophageal echocardiogram I would be comfortable with him being able to discharged home Tuesday night to make his appointment Wednesday morning.  I spent greater than 85 minutes with the patient including greater than 50% of time in face to face counsel of the patient using telephonic translation with the Stratus device regarding his MRSA bacteremia work-up and management of this and in coordination of his care.  Dr. Megan Salon is available this weekend for questions and we will follow-up the cultures.  I will be back on Monday.     LOS: 2 days   Alcide Evener 06/19/2018, 3:58 PM

## 2018-06-19 NOTE — Progress Notes (Signed)
PROGRESS NOTE    Alvin Daniels  BJS:[id number] DOB: 1970/09/05 DOA: 06/16/2018 PCP: [name], MD   Brief Narrative: Alvin Daniels is a 48 y.o. male with a history of ESRD on HD, diabetes mellitus, chronic hepatitis C, HIV, s/p BKA. Patient presented secondary to fevers/chills/malaise and found to have sepsis and eventually MRSA bacteremia. He has been started on Vancomycin and ID is on board.   Assessment & Plan:   Principal Problem:   Bacteremia Active Problems:   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)   Hypotension   ESRD on dialysis ([place])   Sepsis ([place])   Chronic hepatitis C without hepatic coma ([name])   HIV (human immunodeficiency virus infection) ([place])   Diabetic foot ulcer ([place])   MRSA Bacteremia Source is likely right foot wound. Question of abscess on MRI. Infectious disease on board. No vegetations on Transthoracic Echocardiogram.  -ID recommendations: Vancomycin, Transesophageal Echocardiogram -Repeat blood cultures (06/19/2018) pending  Sepsis Present on admission. Secondary to bacteremia. Physiology resolved.  ESRD on HD -Nephrology for HD  Diabetes mellitus Hyperglycemia. History of 70/30 use in the past. Patient's hemoglobin A1C is 9%, down from 13.4% 7 months prior -Continue SSI  Chronic hepatitis C HIV infection -Continue Biktarvy  Diabetic foot ulcer MRI foot negative for osteomyelitis. Possible abscess. -Consult orthopedic surgery to evaluate if patient needs I&D   DVT prophylaxis: Heparin subq Code Status:   Code Status: Full Code Family Communication: None at bedside Disposition Plan: Discharge pending bacteremia workup   Consultants:   Infectious disease  Cardiology  Procedures:   Transthoracic Echocardiogram (06/18/2018) IMPRESSIONS    1. The left ventricle has normal systolic function of 60-73%. The cavity size was normal. There is no increased left ventricular wall thickness. Echo  evidence of pseudonormalization in diastolic relaxation.  2. The right ventricle has normal systolic function. The cavity was normal. There is no increase in right ventricular wall thickness.  3. Left atrial size was mildly dilated.  4. The mitral valve is normal in structure. No evidence of mitral valve stenosis. Trivial mitral regurgitation.  5. The tricuspid valve is normal in structure.  6. The aortic valve is tricuspid. No aortic stenosis.  7. No evidence of left ventricular regional wall motion abnormalities.  8. The inferior vena cava is normal in size with greater than 50% respiratory variability  9. No complete TR doppler jet so unable to estimate PA systolic pressure.  Antimicrobials:  Vancomycin (2/4>>  Biktarvy   Subjective: Interpreter services used.  No concerns today.  Objective: Vitals:   06/19/18 0032 06/19/18 0503 06/19/18 0734 06/19/18 1333  BP: (!) 98/59 121/71 123/70 132/79  Pulse: 91 90    Resp: 17     Temp: 99.4 F (37.4 C) 97.6 F (36.4 C)  98.5 F (36.9 C)  TempSrc: Oral Oral  Oral  SpO2: 98% 97%    Weight:      Height:        Intake/Output Summary (Last 24 hours) at 06/19/2018 1720 Last data filed at 06/19/2018 0742 Gross per 24 hour  Intake 642 ml  Output -  Net 642 ml   Filed Weights   06/17/18 1737 06/18/18 0825 06/18/18 1233  Weight: 54.6 kg 53.7 kg 53.2 kg    Examination:  General exam: Appears calm and comfortable Respiratory system: Clear to auscultation. Respiratory effort normal. Cardiovascular system: S1 & S2 heard, RRR. No murmurs, rubs, gallops or clicks. Gastrointestinal system: Abdomen is nondistended, soft and nontender. No organomegaly or masses  felt. Normal bowel sounds heard. Central nervous system: Alert and oriented. No focal neurological deficits. Extremities:  No calf tenderness. Right foot is swollen with ulcer on heel. No significant tenderness. No erythema. Left BKA. Skin: No cyanosis. No rashes Psychiatry:  Judgement and insight appear normal. Mood & affect appropriate.     Data Reviewed: I have personally reviewed following labs and imaging studies  CBC: Recent Labs  Lab 06/16/18 2300 06/18/18 0647  WBC 9.2 11.6*  NEUTROABS 7.0  --   HGB 10.5* 9.5*  HCT 33.1* 29.1*  MCV 100.6* 99.0  PLT 189 390   Basic Metabolic Panel: Recent Labs  Lab 06/16/18 2300 06/18/18 0647 06/19/18 0430  NA 135 138 136  K 3.9 5.3* 4.5  CL 91* 101 91*  CO2 30 20* 23  GLUCOSE 152* 205* 293*  BUN 27* 44* 31*  CREATININE 7.43* 10.67* 7.64*  CALCIUM 8.2* 8.2* 8.4*  PHOS  --  7.1*  --    GFR: Estimated Creatinine Clearance: 8.8 mL/min (A) (by C-G formula based on SCr of 7.64 mg/dL (H)). Liver Function Tests: Recent Labs  Lab 06/16/18 2302 06/18/18 0647  AST 25  --   ALT 13  --   ALKPHOS 76  --   BILITOT 0.6  --   PROT 8.9*  --   ALBUMIN 3.2* 2.3*   No results for input(s): LIPASE, AMYLASE in the last 168 hours. No results for input(s): AMMONIA in the last 168 hours. Coagulation Profile: No results for input(s): INR, PROTIME in the last 168 hours. Cardiac Enzymes: Recent Labs  Lab 06/17/18 0830 06/17/18 1450  TROPONINI <0.03 <0.03   BNP (last 3 results) No results for input(s): PROBNP in the last 8760 hours. HbA1C: Recent Labs    06/18/18 0647  HGBA1C 9.0*   CBG: Recent Labs  Lab 06/18/18 1628 06/18/18 2139 06/19/18 0744 06/19/18 1340 06/19/18 1654  GLUCAP 148* 295* 249* 261* 266*   Lipid Profile: No results for input(s): CHOL, HDL, LDLCALC, TRIG, CHOLHDL, LDLDIRECT in the last 72 hours. Thyroid Function Tests: No results for input(s): TSH, T4TOTAL, FREET4, T3FREE, THYROIDAB in the last 72 hours. Anemia Panel: No results for input(s): VITAMINB12, FOLATE, FERRITIN, TIBC, IRON, RETICCTPCT in the last 72 hours. Sepsis Labs: Recent Labs  Lab 06/17/18 0138 06/17/18 0500  LATICACIDVEN 2.9* 0.8    Recent Results (from the past 240 hour(s))  Blood Culture (routine x 2)      Status: Abnormal (Preliminary result)   Collection Time: 06/17/18  2:14 AM  Result Value Ref Range Status   Specimen Description BLOOD RIGHT ARM  Final   Special Requests   Final    BOTTLES DRAWN AEROBIC AND ANAEROBIC Blood Culture adequate volume Performed at [facility] [address] [place], [place] [address]    Culture  Setup Time   Final    GRAM POSITIVE COCCI AEROBIC BOTTLE ONLY CRITICAL VALUE NOTED.  VALUE IS CONSISTENT WITH PREVIOUSLY REPORTED AND CALLED VALUE. Performed at [facility], [place] [address] [place], [place] [address]    Culture STAPHYLOCOCCUS AUREUS (A)  Final   Report Status PENDING  Incomplete  Blood Culture (routine x 2)     Status: Abnormal (Preliminary result)   Collection Time: 06/17/18  2:14 AM  Result Value Ref Range Status   Specimen Description   Final    BLOOD RIGHT HAND Performed at [facility] [address], [place],  [address]    Special Requests  Final    BOTTLES DRAWN AEROBIC AND ANAEROBIC Blood Culture adequate volume Performed at [facility] [address], [place], [place] [address]    Culture  Setup Time   Final    ANAEROBIC BOTTLE ONLY GRAM POSITIVE COCCI Organism ID to follow CRITICAL RESULT CALLED TO, READ BACK BY AND VERIFIED WITH: C AMEND PHARMD 06/18/18 0135 JDW    Culture (A)  Final    STAPHYLOCOCCUS AUREUS SUSCEPTIBILITIES TO FOLLOW Performed at [facility], [place] [address] [place], [place] [address]    Report Status PENDING  Incomplete  Blood Culture ID Panel (Reflexed)     Status: Abnormal   Collection Time: 06/17/18  2:14 AM  Result Value Ref Range Status   Enterococcus species NOT DETECTED NOT DETECTED Final   Listeria monocytogenes NOT DETECTED NOT DETECTED Final   Staphylococcus species DETECTED (A) NOT DETECTED Final    Comment: CRITICAL RESULT CALLED TO, READ BACK BY AND VERIFIED WITH: C AMEND PHARMD 06/18/18 0133 JDW     Staphylococcus aureus (BCID) DETECTED (A) NOT DETECTED Final    Comment: Methicillin (oxacillin)-resistant Staphylococcus aureus (MRSA). MRSA is predictably resistant to beta-lactam antibiotics (except ceftaroline). Preferred therapy is vancomycin unless clinically contraindicated. Patient requires contact precautions if  hospitalized. CRITICAL RESULT CALLED TO, READ BACK BY AND VERIFIED WITH: C AMEND PHARMD 06/18/18 0133 JDW    Methicillin resistance DETECTED (A) NOT DETECTED Final    Comment: CRITICAL RESULT CALLED TO, READ BACK BY AND VERIFIED WITH: C AMEND PHARMD 06/18/18 0133 JDW    Streptococcus species NOT DETECTED NOT DETECTED Final   Streptococcus agalactiae NOT DETECTED NOT DETECTED Final   Streptococcus pneumoniae NOT DETECTED NOT DETECTED Final   Streptococcus pyogenes NOT DETECTED NOT DETECTED Final   Acinetobacter baumannii NOT DETECTED NOT DETECTED Final   Enterobacteriaceae species NOT DETECTED NOT DETECTED Final   Enterobacter cloacae complex NOT DETECTED NOT DETECTED Final   Escherichia coli NOT DETECTED NOT DETECTED Final   Klebsiella oxytoca NOT DETECTED NOT DETECTED Final   Klebsiella pneumoniae NOT DETECTED NOT DETECTED Final   Proteus species NOT DETECTED NOT DETECTED Final   Serratia marcescens NOT DETECTED NOT DETECTED Final   Haemophilus influenzae NOT DETECTED NOT DETECTED Final   Neisseria meningitidis NOT DETECTED NOT DETECTED Final   Pseudomonas aeruginosa NOT DETECTED NOT DETECTED Final   Candida albicans NOT DETECTED NOT DETECTED Final   Candida glabrata NOT DETECTED NOT DETECTED Final   Candida krusei NOT DETECTED NOT DETECTED Final   Candida parapsilosis NOT DETECTED NOT DETECTED Final   Candida tropicalis NOT DETECTED NOT DETECTED Final    Comment: Performed at [facility], [place]. [address] [place], [place] [address]         Radiology Studies: Mr Foot Right Wo Contrast  Result Date: 06/19/2018 CLINICAL DATA:  Pressure ulcer on the heel of  the right foot. MRSA bacteremia. EXAM: MRI OF THE RIGHT FOOT WITHOUT CONTRAST TECHNIQUE: Multiplanar, multisequence MR imaging of the right foot was performed. No intravenous contrast was administered. COMPARISON:  Radiographs dated 06/18/2018 and MRI dated 03/05/2016 FINDINGS: Bones/Joint/Cartilage There is a chronic comminuted fracture of the calcaneus with further distraction of the fragments since the prior study. There are new slight arthritic changes of distal talus. The other bones of the foot demonstrate no significant abnormalities. There is a small ankle effusion and a subtalar joint effusion which extends into the soft tissues of the plantar aspect of the heel through the distracted chronic fracture of  the calcaneus. Ligaments Intact. Muscles and Tendons There is fluid in the tendon sheaths of the peroneal tendons extending from proximal to the lateral malleolus to the level of the base of the fifth metatarsal. There is also a fluid collection deep to the abductor digiti minimi tendon along the fifth metatarsal. There is fluid extending from the subtalar joint through the chronic fracture of the calcaneus into the plantar soft tissues along the flexor tendons in the midfoot. Soft tissues There is a superficial soft tissue ulceration on the medial aspect of the heel without deep extension. This does not appear to communicate with the underlying fluid collection from the subtalar joint. IMPRESSION: 1. No findings suggestive of osteomyelitis of the foot or ankle. 2. 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 3. Multiple fluid collections in the foot some of which originate from the subtalar joint and some of which represent tenosynovitis of the peroneal tendons and some of which may represent pseudobursitis deep to the abductor digiti minimi tendon. I doubt that these represent abscesses. However, given the bacteremia, there is potential for secondary infection of these fluid collections. 4.  Superficial soft tissue ulceration of the heel with no underlying abscess or osteomyelitis. Electronically Signed   By: Lorriane Shire M.D.   On: 06/19/2018 13:42   Dg Foot 2 Views Right  Result Date: 06/18/2018 CLINICAL DATA:  Max temp 103, tachypnea, tachycardia, lactic acidosis and hypotension. 1/2 blood culture +MRSA. Has pressure ulcer on right foot as well. EXAM: RIGHT FOOT - 2 VIEW COMPARISON:  MRI, 03/05/2016 FINDINGS: No convincing acute fracture. There are fractures of the calcaneus with associated sclerosis, which were present on the prior MRI. The calcaneal tuberosity is truncated, but smoothly marginated. No areas of bone resorption is seen to suggest acute osteomyelitis. No acute dislocation. There is soft tissue swelling that is most evident over the lateral aspect of the mid to hindfoot. IMPRESSION: 1. Fragmented calcaneus similar to the prior MRI consistent with chronic ununited fractures. 2. There is no bone resorption to suggest osteomyelitis. No acute fractures. 3. Soft tissue swelling which predominates along the mid to hindfoot greater laterally. Electronically Signed   By: Lajean Manes M.D.   On: 06/18/2018 14:22        Scheduled Meds: . bictegravir-emtricitabine-tenofovir AF  1 tablet Oral Daily  . [START ON 06/20/2018] Chlorhexidine Gluconate Cloth  6 each Topical Q0600  . doxercalciferol  3 mcg Intravenous Q T,Th,Sa-HD  . ferric citrate  420 mg Oral TID WC  . heparin  5,000 Units Subcutaneous Q8H  . insulin aspart  0-15 Units Subcutaneous TID WC  . insulin aspart  0-5 Units Subcutaneous QHS  . midodrine  5 mg Oral TID WC  . vancomycin  500 mg Intravenous Q T,Th,Sa-HD   Continuous Infusions:   LOS: 2 days     Cordelia Poche, MD [facility] 06/19/2018, 5:20 PM  If 7PM-7AM, please contact night-coverage [email or web address]

## 2018-06-19 NOTE — Progress Notes (Signed)
[facility] NEPHROLOGY PROGRESS NOTE  Assessment/ Plan: Pt is a 48 y.o. yo male with DM, hep C, HIV, left BKA, chronic hypertension, ESRD TTS at [place] via AV fistula admitted with MRSA bacteremia.   Dialyzes at [facility], 4 hour, 2K, 2 ca, 400/800,   EDW 53 Kg. Access AVF Mircera 50 mcg last dose 1/30 Last PTH 141, phos 6.8 on 06/04/2018 Hectorol 3 mcg every rx Heparin 2000 units bolus every rx #MRSA bacteremia: On IV vancomycin, plan for TEE per ID, need 6 weeks of IV antibiotics postdialysis per ID.  #ESRD on HD, TTS: Had dialysis yesterday, plan for next dialysis tomorrow.  He has AV fistula for the access.  The access site looks clean with no discharge or tenderness.  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  Monitor CBC.  #Secondary hyperparathyroidism: Continue Hectorol during treatment.  Phosphorus elevated. Resume auryxia.   #Hypotension: Patient has chronic hypotension. On miodrine. BP better today.   Subjective: Seen and examined.  Feeling better.  Denies headache, dizziness, nausea, vomiting, chest pain, shortness of breath. Objective Vital signs in last 24 hours: Vitals:   06/18/18 2140 06/19/18 0032 06/19/18 0503 06/19/18 0734  BP: 104/65 (!) 98/59 121/71 123/70  Pulse:  91 90   Resp:  17    Temp:  99.4 F (37.4 C) 97.6 F (36.4 C)   TempSrc:  Oral Oral   SpO2:  98% 97%   Weight:      Height:       Weight change: -0.913 kg  Intake/Output Summary (Last 24 hours) at 06/19/2018 1156 Last data filed at 06/19/2018 0742 Gross per 24 hour  Intake 1044 ml  Output 500 ml  Net 544 ml       Labs: Basic Metabolic Panel: Recent Labs  Lab 06/16/18 2300 06/18/18 0647 06/19/18 0430  NA 135 138 136  K 3.9 5.3* 4.5  CL 91* 101 91*  CO2 30 20* 23  GLUCOSE 152* 205* 293*  BUN 27* 44* 31*  CREATININE 7.43* 10.67* 7.64*  CALCIUM 8.2* 8.2* 8.4*  PHOS  --  7.1*  --    Liver Function Tests: Recent Labs  Lab 06/16/18 2302  06/18/18 0647  AST 25  --   ALT 13  --   ALKPHOS 76  --   BILITOT 0.6  --   PROT 8.9*  --   ALBUMIN 3.2* 2.3*   No results for input(s): LIPASE, AMYLASE in the last 168 hours. No results for input(s): AMMONIA in the last 168 hours. CBC: Recent Labs  Lab 06/16/18 2300 06/18/18 0647  WBC 9.2 11.6*  NEUTROABS 7.0  --   HGB 10.5* 9.5*  HCT 33.1* 29.1*  MCV 100.6* 99.0  PLT 189 174   Cardiac Enzymes: Recent Labs  Lab 06/17/18 0830 06/17/18 1450  TROPONINI <0.03 <0.03   CBG: Recent Labs  Lab 06/18/18 0727 06/18/18 1244 06/18/18 1628 06/18/18 2139 06/19/18 0744  GLUCAP 180* 108* 148* 295* 249*    Iron Studies: No results for input(s): IRON, TIBC, TRANSFERRIN, FERRITIN in the last 72 hours. Studies/Results: Dg Foot 2 Views Right  Result Date: 06/18/2018 CLINICAL DATA:  Max temp 103, tachypnea, tachycardia, lactic acidosis and hypotension. 1/2 blood culture +MRSA. Has pressure ulcer on right foot as well. EXAM: RIGHT FOOT - 2 VIEW COMPARISON:  MRI, 03/05/2016 FINDINGS: No convincing acute fracture. There are fractures of the calcaneus with associated sclerosis, which were present on the prior MRI. The calcaneal tuberosity  is truncated, but smoothly marginated. No areas of bone resorption is seen to suggest acute osteomyelitis. No acute dislocation. There is soft tissue swelling that is most evident over the lateral aspect of the mid to hindfoot. IMPRESSION: 1. Fragmented calcaneus similar to the prior MRI consistent with chronic ununited fractures. 2. There is no bone resorption to suggest osteomyelitis. No acute fractures. 3. Soft tissue swelling which predominates along the mid to hindfoot greater laterally. Electronically Signed   By: Lajean Manes M.D.   On: 06/18/2018 14:22    Medications: Infusions:   Scheduled Medications: . bictegravir-emtricitabine-tenofovir AF  1 tablet Oral Daily  . doxercalciferol  3 mcg Intravenous Q T,Th,Sa-HD  . heparin  5,000 Units  Subcutaneous Q8H  . insulin aspart  0-15 Units Subcutaneous TID WC  . insulin aspart  0-5 Units Subcutaneous QHS  . midodrine  5 mg Oral TID WC  . vancomycin  500 mg Intravenous Q T,Th,Sa-HD    have reviewed scheduled and prn medications.  Physical Exam: General:NAD, comfortable Heart:RRR, s1s2 nl Lungs:clear b/l, no crackle Abdomen:soft, Non-tender, non-distended Extremities:No edema Dialysis Access: AV fistula has good thrill and bruit.  Temitope Flammer Prasad Carrah Eppolito 06/19/2018,11:56 AM  LOS: 2 days

## 2018-06-19 NOTE — Progress Notes (Signed)
   06/19/18 1400  Clinical Encounter Type  Visited With Patient  Visit Type Initial  Referral From Other (Comment) Lesle Chris -interpreter requested I visit PT. Spanish)  Consult/Referral To Chaplain  Spiritual Encounters  Spiritual Needs Emotional;Prayer  Stress Factors  Patient Stress Factors Health changes;Major life changes   PT was alert recently received his Lunch. PT was seemingly wanting to talk. Our conversation was in [place]. PT mentioned his pain he had a few days ago and now says he feels like he is getting much better. PT also states that he is ready for whatever comes his way. I offered spiritual care with empathic listening, ministry of presence, back and forth dialogue and prayer. PT stated he had never had any visitors like the Chaplain to talk. He was very thankful and appreciative for the time I spent with. Chaplain available as needed.  Chaplain Fidel Levy

## 2018-06-19 NOTE — Progress Notes (Signed)
Patient ID: Alvin Daniels, male   DOB: 1970/06/30, 48 y.o.   MRN: [id number] I was able to come by the bedside and assessed the patient's right foot.  I was also able to review his chart to see what is been done in the past.  I did also look at his plain films and the MRI.  He informs me he has some chronic wounds on the plantar aspect of his foot.  There is obvious previous Charcot collapse and a calcaneus fracture that is old and not united.  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  He does have small plantar wounds.  I could not express any purulence from these areas.  These all appear chronic.  I do not see a need for any type of urgent operation this weekend on his foot or even the need for an irrigation and debridement.  Unfortunately at some point he may end up needing a below-knee amputation on the side.  He is hoping to eventually have a prosthesis for his left lower extremity that has a previous below-knee amputation.  He has seen both my partners Dr. Lorin Mercy and more recently Dr. Sharol Given who he is familiar with.  If the patient is here on Monday, I will let Dr. Sharol Given know to come by and take a look at his foot.  Again, I do not see the need for any type of urgent operation this weekend for these chronic changes.  If the patient is discharged over the weekend, he does need a follow-up with Dr. Sharol Given within the next week or so.

## 2018-06-20 LAB — RENAL FUNCTION PANEL
Albumin: 2.3 g/dL — ABNORMAL LOW (ref 3.5–5.0)
Anion gap: 20 — ABNORMAL HIGH (ref 5–15)
BUN: 52 mg/dL — ABNORMAL HIGH (ref 6–20)
CHLORIDE: 91 mmol/L — AB (ref 98–111)
CO2: 21 mmol/L — ABNORMAL LOW (ref 22–32)
Calcium: 8.6 mg/dL — ABNORMAL LOW (ref 8.9–10.3)
Creatinine, Ser: 10.79 mg/dL — ABNORMAL HIGH (ref 0.61–1.24)
GFR calc Af Amer: 6 mL/min — ABNORMAL LOW (ref >60)
GFR calc non Af Amer: 5 mL/min — ABNORMAL LOW (ref >60)
Glucose, Bld: 407 mg/dL — ABNORMAL HIGH (ref 70–99)
Phosphorus: 6.2 mg/dL — ABNORMAL HIGH (ref 2.5–4.6)
Potassium: 4.4 mmol/L (ref 3.5–5.1)
Sodium: 132 mmol/L — ABNORMAL LOW (ref 135–145)

## 2018-06-20 LAB — CULTURE, BLOOD (ROUTINE X 2)
Special Requests: ADEQUATE
Special Requests: ADEQUATE

## 2018-06-20 LAB — CBC
HEMATOCRIT: 28.5 % — AB (ref 39.0–52.0)
Hemoglobin: 9.1 g/dL — ABNORMAL LOW (ref 13.0–17.0)
MCH: 30.7 pg (ref 26.0–34.0)
MCHC: 31.9 g/dL (ref 30.0–36.0)
MCV: 96.3 fL (ref 80.0–100.0)
Platelets: 236 10*3/uL (ref 150–400)
RBC: 2.96 MIL/uL — ABNORMAL LOW (ref 4.22–5.81)
RDW: 14 % (ref 11.5–15.5)
WBC: 9 10*3/uL (ref 4.0–10.5)
nRBC: 0 % (ref 0.0–0.2)

## 2018-06-20 LAB — GLUCOSE, CAPILLARY
Glucose-Capillary: 269 mg/dL — ABNORMAL HIGH (ref 70–99)
Glucose-Capillary: 161 mg/dL — ABNORMAL HIGH (ref 70–99)
Glucose-Capillary: 201 mg/dL — ABNORMAL HIGH (ref 70–99)
Glucose-Capillary: 251 mg/dL — ABNORMAL HIGH (ref 70–99)

## 2018-06-20 MED ORDER — DOXERCALCIFEROL 4 MCG/2ML IV SOLN
INTRAVENOUS | Status: AC
  Administered 2018-06-20: 3 ug via INTRAVENOUS
  Filled 2018-06-20: qty 2

## 2018-06-20 MED ORDER — VANCOMYCIN HCL IN DEXTROSE 500-5 MG/100ML-% IV SOLN
INTRAVENOUS | Status: AC
  Administered 2018-06-20: 500 mg via INTRAVENOUS
  Filled 2018-06-20: qty 100

## 2018-06-20 MED ORDER — MIDODRINE HCL 5 MG PO TABS
ORAL_TABLET | ORAL | Status: AC
  Administered 2018-06-20: 5 mg via ORAL
  Filled 2018-06-20: qty 1

## 2018-06-20 NOTE — Procedures (Signed)
Patient was seen on dialysis and the procedure was supervised.  BFR 400  Via AVF BP is  122/72. UF goal increase to 2.5 L.  Plan for TEE on Monday noted.    Patient appears to be tolerating treatment well  Alvin Daniels [name] 06/20/2018

## 2018-06-20 NOTE — Progress Notes (Signed)
PROGRESS NOTE    Alvin Daniels  ATF:[id number] DOB: Apr 05, 1971 DOA: 06/16/2018 PCP: [name], MD   Brief Narrative: Alvin Daniels is a 48 y.o. male with a history of ESRD on HD, diabetes mellitus, chronic hepatitis C, HIV, s/p BKA. Patient presented secondary to fevers/chills/malaise and found to have sepsis and eventually MRSA bacteremia. He has been started on Vancomycin and ID is on board.   Assessment & Plan:   Principal Problem:   Bacteremia Active Problems:   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)   Hypotension   ESRD on dialysis ([place])   Sepsis ([place])   Chronic hepatitis C without hepatic coma ([name])   HIV (human immunodeficiency virus infection) ([place])   Diabetic foot ulcer ([place])   MRSA Bacteremia Source is likely right foot wound. Question of abscess on MRI. Infectious disease on board. No vegetations on Transthoracic Echocardiogram.  -ID recommendations: Vancomycin, Transesophageal Echocardiogram -Repeat blood cultures (06/19/2018) still pending  Sepsis Septic shock Present on admission. Secondary to bacteremia. Physiology resolved.  ESRD on HD -Nephrology for HD  Diabetes mellitus Hyperglycemia. History of 70/30 use in the past. Patient's hemoglobin A1C is 9%, down from 13.4% 7 months prior -Continue SSI  Chronic hepatitis C HIV infection -Continue Biktarvy -ID outpatient follow-up  Diabetic foot ulcer MRI foot negative for osteomyelitis. Possible abscess. Not a pressure ulcer -Orthopedic surgery recommendations pending   DVT prophylaxis: Heparin subq Code Status:   Code Status: Full Code Family Communication: None at bedside Disposition Plan: Discharge pending bacteremia workup   Consultants:   Infectious disease  Cardiology  Procedures:   Transthoracic Echocardiogram (06/18/2018) IMPRESSIONS    1. The left ventricle has normal systolic function of 27-06%. The cavity size was normal. There is no  increased left ventricular wall thickness. Echo evidence of pseudonormalization in diastolic relaxation.  2. The right ventricle has normal systolic function. The cavity was normal. There is no increase in right ventricular wall thickness.  3. Left atrial size was mildly dilated.  4. The mitral valve is normal in structure. No evidence of mitral valve stenosis. Trivial mitral regurgitation.  5. The tricuspid valve is normal in structure.  6. The aortic valve is tricuspid. No aortic stenosis.  7. No evidence of left ventricular regional wall motion abnormalities.  8. The inferior vena cava is normal in size with greater than 50% respiratory variability  9. No complete TR doppler jet so unable to estimate PA systolic pressure.  Antimicrobials:  Vancomycin (2/4>>  Biktarvy   Subjective: Interpreter services used.  Some stump pain earlier which has improved. Headache earlier which is resolved. Has some pain in his right foot when ambulating.  Objective: Vitals:   06/20/18 1300 06/20/18 1315 06/20/18 1330 06/20/18 1355  BP: (!) 92/48 (!) 104/40 (!) 106/54 (!) 107/56  Pulse: 79 88 87 87  Resp:    (!) 24  Temp:    99 F (37.2 C)  TempSrc:    Oral  SpO2:    99%  Weight:    51.3 kg  Height:        Intake/Output Summary (Last 24 hours) at 06/20/2018 1432 Last data filed at 06/20/2018 1355 Gross per 24 hour  Intake 442 ml  Output 1223 ml  Net -781 ml   Filed Weights   06/20/18 0416 06/20/18 0945 06/20/18 1355  Weight: 54 kg 52.6 kg 51.3 kg    Examination:  General exam: Appears calm and comfortable Respiratory system: Clear to auscultation. Respiratory effort normal. Cardiovascular system:  S1 & S2 heard, RRR. No murmurs, rubs, gallops or clicks. Gastrointestinal system: Abdomen is nondistended, soft and nontender. No organomegaly or masses felt. Normal bowel sounds heard. Central nervous system: Alert and oriented. No focal neurological deficits. Extremities: No edema. No calf  tenderness. Left BKA Skin: No cyanosis. No rashes Psychiatry: Judgement and insight appear normal. Mood & affect appropriate.    Data Reviewed: I have personally reviewed following labs and imaging studies  CBC: Recent Labs  Lab 06/16/18 2300 06/18/18 0647 06/19/18 1932 06/20/18 1006  WBC 9.2 11.6* 11.7* 9.0  NEUTROABS 7.0  --   --   --   HGB 10.5* 9.5* 9.8* 9.1*  HCT 33.1* 29.1* 29.8* 28.5*  MCV 100.6* 99.0 94.9 96.3  PLT 189 174 259 726   Basic Metabolic Panel: Recent Labs  Lab 06/16/18 2300 06/18/18 0647 06/19/18 0430 06/19/18 1932 06/20/18 1007  NA 135 138 136 135 132*  K 3.9 5.3* 4.5 4.2 4.4  CL 91* 101 91* 92* 91*  CO2 30 20* 23 25 21*  GLUCOSE 152* 205* 293* 290* 407*  BUN 27* 44* 31* 44* 52*  CREATININE 7.43* 10.67* 7.64* 8.80* 10.79*  CALCIUM 8.2* 8.2* 8.4* 8.6* 8.6*  PHOS  --  7.1*  --  5.5* 6.2*   GFR: Estimated Creatinine Clearance: 6.1 mL/min (A) (by C-G formula based on SCr of 10.79 mg/dL (H)). Liver Function Tests: Recent Labs  Lab 06/16/18 2302 06/18/18 0647 06/19/18 1932 06/20/18 1007  AST 25  --   --   --   ALT 13  --   --   --   ALKPHOS 76  --   --   --   BILITOT 0.6  --   --   --   PROT 8.9*  --   --   --   ALBUMIN 3.2* 2.3* 2.3* 2.3*   No results for input(s): LIPASE, AMYLASE in the last 168 hours. No results for input(s): AMMONIA in the last 168 hours. Coagulation Profile: No results for input(s): INR, PROTIME in the last 168 hours. Cardiac Enzymes: Recent Labs  Lab 06/17/18 0830 06/17/18 1450  TROPONINI <0.03 <0.03   BNP (last 3 results) No results for input(s): PROBNP in the last 8760 hours. HbA1C: Recent Labs    06/18/18 0647  HGBA1C 9.0*   CBG: Recent Labs  Lab 06/19/18 0744 06/19/18 1340 06/19/18 1654 06/19/18 2159 06/20/18 0729  GLUCAP 249* 261* 266* 325* 269*   Lipid Profile: No results for input(s): CHOL, HDL, LDLCALC, TRIG, CHOLHDL, LDLDIRECT in the last 72 hours. Thyroid Function Tests: No results  for input(s): TSH, T4TOTAL, FREET4, T3FREE, THYROIDAB in the last 72 hours. Anemia Panel: No results for input(s): VITAMINB12, FOLATE, FERRITIN, TIBC, IRON, RETICCTPCT in the last 72 hours. Sepsis Labs: Recent Labs  Lab 06/17/18 0138 06/17/18 0500  LATICACIDVEN 2.9* 0.8    Recent Results (from the past 240 hour(s))  Blood Culture (routine x 2)     Status: Abnormal   Collection Time: 06/17/18  2:14 AM  Result Value Ref Range Status   Specimen Description BLOOD RIGHT ARM  Final   Special Requests   Final    BOTTLES DRAWN AEROBIC AND ANAEROBIC Blood Culture adequate volume Performed at [facility], [place] [address], [place], [place] [address]    Culture  Setup Time   Final    GRAM POSITIVE COCCI AEROBIC BOTTLE ONLY CRITICAL VALUE NOTED.  VALUE IS CONSISTENT WITH PREVIOUSLY REPORTED AND CALLED VALUE.    Culture (  A)  Final    STAPHYLOCOCCUS AUREUS SUSCEPTIBILITIES PERFORMED ON PREVIOUS CULTURE WITHIN THE LAST 5 DAYS. Performed at [facility], [place] [address], [place], [place] [address]    Report Status 06/20/2018 FINAL  Final  Blood Culture (routine x 2)     Status: Abnormal   Collection Time: 06/17/18  2:14 AM  Result Value Ref Range Status   Specimen Description   Final    BLOOD RIGHT HAND Performed at [facility] [address] [place], [place] [address]    Special Requests   Final    BOTTLES DRAWN AEROBIC AND ANAEROBIC Blood Culture adequate volume Performed at [facility] [address] [place], [place] [address]    Culture  Setup Time   Final    IN BOTH AEROBIC AND ANAEROBIC BOTTLES GRAM POSITIVE COCCI CRITICAL RESULT CALLED TO, READ BACK BY AND VERIFIED WITH: C AMEND [place] 06/18/18 0135 JDW Performed at [facility], [place] [address], [place], [place] [address]    Culture METHICILLIN RESISTANT STAPHYLOCOCCUS AUREUS (A)  Final   Report Status 06/20/2018 FINAL  Final   Organism ID, Bacteria  METHICILLIN RESISTANT STAPHYLOCOCCUS AUREUS  Final      Susceptibility   Methicillin resistant staphylococcus aureus - MIC*    CIPROFLOXACIN <=0.5 SENSITIVE Sensitive     ERYTHROMYCIN >=8 RESISTANT Resistant     GENTAMICIN <=0.5 SENSITIVE Sensitive     OXACILLIN >=4 RESISTANT Resistant     TETRACYCLINE >=16 RESISTANT Resistant     VANCOMYCIN <=0.5 SENSITIVE Sensitive     TRIMETH/SULFA <=10 SENSITIVE Sensitive     CLINDAMYCIN <=0.25 SENSITIVE Sensitive     RIFAMPIN <=0.5 SENSITIVE Sensitive     Inducible Clindamycin NEGATIVE Sensitive     * METHICILLIN RESISTANT STAPHYLOCOCCUS AUREUS  Blood Culture ID Panel (Reflexed)     Status: Abnormal   Collection Time: 06/17/18  2:14 AM  Result Value Ref Range Status   Enterococcus species NOT DETECTED NOT DETECTED Final   Listeria monocytogenes NOT DETECTED NOT DETECTED Final   Staphylococcus species DETECTED (A) NOT DETECTED Final    Comment: CRITICAL RESULT CALLED TO, READ BACK BY AND VERIFIED WITH: C AMEND PHAR[place] 06/18/18 0133 JDW    Staphylococcus aureus (BCID) DETECTED (A) NOT DETECTED Final    Comment: Methicillin (oxacillin)-resistant Staphylococcus aureus (MRSA). MRSA is predictably resistant to beta-lactam antibiotics (except ceftaroline). Preferred therapy is vancomycin unless clinically contraindicated. Patient requires contact precautions if  hospitalized. CRITICAL RESULT CALLED TO, READ BACK BY AND VERIFIED WITH: C AMEND PHARMD 06/18/18 0133 JDW    Methicillin resistance DETECTED (A) NOT DETECTED Final    Comment: CRITICAL RESULT CALLED TO, READ BACK BY AND VERIFIED WITH: C AMEND PHARMD 06/18/18 0133 JDW    Streptococcus species NOT DETECTED NOT DETECTED Final   Streptococcus agalactiae NOT DETECTED NOT DETECTED Final   Streptococcus pneumoniae NOT DETECTED NOT DETECTED Final   Streptococcus pyogenes NOT DETECTED NOT DETECTED Final   Acinetobacter baumannii NOT DETECTED NOT DETECTED Final   Enterobacteriaceae species NOT DETECTED NOT  DETECTED Final   Enterobacter cloacae complex NOT DETECTED NOT DETECTED Final   Escherichia coli NOT DETECTED NOT DETECTED Final   Klebsiella oxytoca NOT DETECTED NOT DETECTED Final   Klebsiella pneumoniae NOT DETECTED NOT DETECTED Final   Proteus species NOT DETECTED NOT DETECTED Final   Serratia marcescens NOT DETECTED NOT DETECTED Final   Haemophilus influenzae NOT DETECTED NOT DETECTED Final   Neisseria meningitidis NOT DETECTED NOT DETECTED Final  Pseudomonas aeruginosa NOT DETECTED NOT DETECTED Final   Candida albicans NOT DETECTED NOT DETECTED Final   Candida glabrata NOT DETECTED NOT DETECTED Final   Candida krusei NOT DETECTED NOT DETECTED Final   Candida parapsilosis NOT DETECTED NOT DETECTED Final   Candida tropicalis NOT DETECTED NOT DETECTED Final    Comment: Performed at [facility], [place] [address] [place],  [address]         Radiology Studies: Mr Foot Right Wo Contrast  Result Date: 06/19/2018 CLINICAL DATA:  Pressure ulcer on the heel of the right foot. MRSA bacteremia. EXAM: MRI OF THE RIGHT FOOT WITHOUT CONTRAST TECHNIQUE: Multiplanar, multisequence MR imaging of the right foot was performed. No intravenous contrast was administered. COMPARISON:  Radiographs dated 06/18/2018 and MRI dated 03/05/2016 FINDINGS: Bones/Joint/Cartilage There is a chronic comminuted fracture of the calcaneus with further distraction of the fragments since the prior study. There are new slight arthritic changes of distal talus. The other bones of the foot demonstrate no significant abnormalities. There is a small ankle effusion and a subtalar joint effusion which extends into the soft tissues of the plantar aspect of the heel through the distracted chronic fracture of the calcaneus. Ligaments Intact. Muscles and Tendons There is fluid in the tendon sheaths of the peroneal tendons extending from proximal to the lateral malleolus to the level of the base of the fifth metatarsal. There  is also a fluid collection deep to the abductor digiti minimi tendon along the fifth metatarsal. There is fluid extending from the subtalar joint through the chronic fracture of the calcaneus into the plantar soft tissues along the flexor tendons in the midfoot. Soft tissues There is a superficial soft tissue ulceration on the medial aspect of the heel without deep extension. This does not appear to communicate with the underlying fluid collection from the subtalar joint. IMPRESSION: 1. No findings suggestive of osteomyelitis of the foot or ankle. 2. 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 3. Multiple fluid collections in the foot some of which originate from the subtalar joint and some of which represent tenosynovitis of the peroneal tendons and some of which may represent pseudobursitis deep to the abductor digiti minimi tendon. I doubt that these represent abscesses. However, given the bacteremia, there is potential for secondary infection of these fluid collections. 4. Superficial soft tissue ulceration of the heel with no underlying abscess or osteomyelitis. Electronically Signed   By: Lorriane Shire M.D.   On: 06/19/2018 13:42        Scheduled Meds: . bictegravir-emtricitabine-tenofovir AF  1 tablet Oral Daily  . Chlorhexidine Gluconate Cloth  6 each Topical Q0600  . doxercalciferol  3 mcg Intravenous Q T,Th,Sa-HD  . ferric citrate  420 mg Oral TID WC  . heparin  2,000 Units Dialysis Once in dialysis  . heparin  5,000 Units Subcutaneous Q8H  . insulin aspart  0-15 Units Subcutaneous TID WC  . insulin aspart  0-5 Units Subcutaneous QHS  . midodrine  5 mg Oral TID WC  . vancomycin  500 mg Intravenous Q T,Th,Sa-HD   Continuous Infusions: . sodium chloride    . sodium chloride    . sodium chloride       LOS: 3 days     Cordelia Poche, MD [facility] 06/20/2018, 2:32 PM  If 7PM-7AM, please contact night-coverage [email or web address]

## 2018-06-21 LAB — GLUCOSE, CAPILLARY
Glucose-Capillary: 291 mg/dL — ABNORMAL HIGH (ref 70–99)
Glucose-Capillary: 388 mg/dL — ABNORMAL HIGH (ref 70–99)
Glucose-Capillary: 328 mg/dL — ABNORMAL HIGH (ref 70–99)
Glucose-Capillary: 260 mg/dL — ABNORMAL HIGH (ref 70–99)

## 2018-06-21 NOTE — Progress Notes (Signed)
Pharmacy Antibiotic Note  Alvin Daniels is a 48 y.o. male with MRSA bacteremia on vancomycin.  He is noted with HD with last session 2/8.  Plans are noted for TEE on 2/10 and for 6 weeks of antibiotics   Plan: Continue Vancomycin 500mg  IV qHD (T/T/S)   Height: 5\' 1"  (154.9 cm) Weight: 108 lb 7.5 oz (49.2 kg) IBW/kg (Calculated) : 52.3  Temp (24hrs), Avg:99.6 F (37.6 C), Min:98 F (36.7 C), Max:101.3 F (38.5 C)  Recent Labs  Lab 06/16/18 2300 06/17/18 0138 06/17/18 0500 06/18/18 0647 06/19/18 0430 06/19/18 1932 06/20/18 1006 06/20/18 1007  WBC 9.2  --   --  11.6*  --  11.7* 9.0  --   CREATININE 7.43*  --   --  10.67* 7.64* 8.80*  --  10.79*  LATICACIDVEN  --  2.9* 0.8  --   --   --   --   --     Estimated Creatinine Clearance: 5.9 mL/min (A) (by C-G formula based on SCr of 10.79 mg/dL (H)).    No Known Allergies  Antimicrobials this admission: 2/5 vanc >> 2/5 cefepime >> 2/6 2/5 flagyl >> 2/6  Microbiology results: 2/5 BCx: MRSA 2/7 BC: ngtd  Hildred Laser, PharmD Clinical Pharmacist **Pharmacist phone directory can now be found on [email or web address] ([email or web address]).  Listed under [place].

## 2018-06-21 NOTE — Progress Notes (Signed)
Alvin Daniels NEPHROLOGY PROGRESS NOTE  Assessment/ Plan: Pt is a 48 y.o. yo male with DM, hep C, HIV, left BKA, chronic hypertension, ESRD TTS at [place] via AV fistula admitted with MRSA bacteremia.   Dialyzes at [place], 4 hour, 2K, 2 ca, 400/800,   EDW 53 Kg. Access AVF Mircera 50 mcg last dose 1/30 Last PTH 141, phos 6.8 on 06/04/2018 Hectorol 3 mcg every rx Heparin 2000 units bolus every rx  #MRSA bacteremia: On IV vancomycin, plan for TEE on 2/10, needs 6 weeks of IV antibiotics postdialysis per ID.  #ESRD on HD, TTS: Had dialysis yesterday with 1.2 L ultrafiltration, tolerated well.  Plan for next dialysis on Tuesday. He has AV fistula for the access.  The access site looks clean with no discharge or tenderness.  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  Monitor CBC.  No iron because of infection.  #Secondary hyperparathyroidism: Continue Hectorol during treatment.  Phosphorus elevated. Resume auryxia.   #Hypotension: Patient has chronic hypotension. On miodrine. BP better today.   Subjective: Seen and examined.  Denies chest pain, shortness of breath, nausea vomiting.  Objective Vital signs in last 24 hours: Vitals:   06/21/18 0456 06/21/18 0500 06/21/18 0757 06/21/18 1122  BP: (!) 93/45  105/60 102/66  Pulse: 85  92 92  Resp: 14     Temp: 98.5 F (36.9 C)  99.3 F (37.4 C) 98 F (36.7 C)  TempSrc: Oral  Oral Oral  SpO2: 96%  99% 100%  Weight:  49.2 kg    Height:  5\' 1"  (1.549 m)     Weight change: -1.4 kg  Intake/Output Summary (Last 24 hours) at 06/21/2018 1131 Last data filed at 06/20/2018 2250 Gross per 24 hour  Intake 120 ml  Output 1223 ml  Net -1103 ml       Labs: Basic Metabolic Panel: Recent Labs  Lab 06/18/18 0647 06/19/18 0430 06/19/18 1932 06/20/18 1007  NA 138 136 135 132*  K 5.3* 4.5 4.2 4.4  CL 101 91* 92* 91*  CO2 20* 23 25 21*  GLUCOSE 205* 293* 290* 407*  BUN 44* 31* 44* 52*  CREATININE  10.67* 7.64* 8.80* 10.79*  CALCIUM 8.2* 8.4* 8.6* 8.6*  PHOS 7.1*  --  5.5* 6.2*   Liver Function Tests: Recent Labs  Lab 06/16/18 2302 06/18/18 0647 06/19/18 1932 06/20/18 1007  AST 25  --   --   --   ALT 13  --   --   --   ALKPHOS 76  --   --   --   BILITOT 0.6  --   --   --   PROT 8.9*  --   --   --   ALBUMIN 3.2* 2.3* 2.3* 2.3*   No results for input(s): LIPASE, AMYLASE in the last 168 hours. No results for input(s): AMMONIA in the last 168 hours. CBC: Recent Labs  Lab 06/16/18 2300 06/18/18 0647 06/19/18 1932 06/20/18 1006  WBC 9.2 11.6* 11.7* 9.0  NEUTROABS 7.0  --   --   --   HGB 10.5* 9.5* 9.8* 9.1*  HCT 33.1* 29.1* 29.8* 28.5*  MCV 100.6* 99.0 94.9 96.3  PLT 189 174 259 236   Cardiac Enzymes: Recent Labs  Lab 06/17/18 0830 06/17/18 1450  TROPONINI <0.03 <0.03   CBG: Recent Labs  Lab 06/20/18 1449 06/20/18 1629 06/20/18 2101 06/21/18 0754 06/21/18 1119  GLUCAP 161* 201* 251* 291* 388*    Iron  Studies: No results for input(s): IRON, TIBC, TRANSFERRIN, FERRITIN in the last 72 hours. Studies/Results: Mr Foot Right Wo Contrast  Result Date: 06/19/2018 CLINICAL DATA:  Pressure ulcer on the heel of the right foot. MRSA bacteremia. EXAM: MRI OF THE RIGHT FOOT WITHOUT CONTRAST TECHNIQUE: Multiplanar, multisequence MR imaging of the right foot was performed. No intravenous contrast was administered. COMPARISON:  Radiographs dated 06/18/2018 and MRI dated 03/05/2016 FINDINGS: Bones/Joint/Cartilage There is a chronic comminuted fracture of the calcaneus with further distraction of the fragments since the prior study. There are new slight arthritic changes of distal talus. The other bones of the foot demonstrate no significant abnormalities. There is a small ankle effusion and a subtalar joint effusion which extends into the soft tissues of the plantar aspect of the heel through the distracted chronic fracture of the calcaneus. Ligaments Intact. Muscles and Tendons  There is fluid in the tendon sheaths of the peroneal tendons extending from proximal to the lateral malleolus to the level of the base of the fifth metatarsal. There is also a fluid collection deep to the abductor digiti minimi tendon along the fifth metatarsal. There is fluid extending from the subtalar joint through the chronic fracture of the calcaneus into the plantar soft tissues along the flexor tendons in the midfoot. Soft tissues There is a superficial soft tissue ulceration on the medial aspect of the heel without deep extension. This does not appear to communicate with the underlying fluid collection from the subtalar joint. IMPRESSION: 1. No findings suggestive of osteomyelitis of the foot or ankle. 2. 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 3. Multiple fluid collections in the foot some of which originate from the subtalar joint and some of which represent tenosynovitis of the peroneal tendons and some of which may represent pseudobursitis deep to the abductor digiti minimi tendon. I doubt that these represent abscesses. However, given the bacteremia, there is potential for secondary infection of these fluid collections. 4. Superficial soft tissue ulceration of the heel with no underlying abscess or osteomyelitis. Electronically Signed   By: Lorriane Shire M.D.   On: 06/19/2018 13:42    Medications: Infusions: . sodium chloride      Scheduled Medications: . bictegravir-emtricitabine-tenofovir AF  1 tablet Oral Daily  . Chlorhexidine Gluconate Cloth  6 each Topical Q0600  . doxercalciferol  3 mcg Intravenous Q T,Th,Sa-HD  . ferric citrate  420 mg Oral TID WC  . heparin  5,000 Units Subcutaneous Q8H  . insulin aspart  0-15 Units Subcutaneous TID WC  . insulin aspart  0-5 Units Subcutaneous QHS  . midodrine  5 mg Oral TID WC  . vancomycin  500 mg Intravenous Q T,Th,Sa-HD    have reviewed scheduled and prn medications.  Physical Exam: General: Not in distress,  comfortable Heart:RRR, s1s2 nl Lungs: Clear bilateral, no wheezing or crackle Abdomen:soft, Non-tender, non-distended Extremities: No edema Dialysis Access: AV fistula has good thrill and bruit.  Ken Bonn Prasad Bruna Dills 06/21/2018,11:31 AM  LOS: 4 days

## 2018-06-21 NOTE — Progress Notes (Addendum)
PROGRESS NOTE    Alvin Daniels  YIR:[id number] DOB: 06/27/1970 DOA: 06/16/2018 PCP: [name], MD   Brief Narrative: Alvin Daniels is a 48 y.o. male with a history of ESRD on HD, diabetes mellitus, chronic hepatitis C, HIV, s/p BKA. Patient presented secondary to fevers/chills/malaise and found to have sepsis and eventually MRSA bacteremia. He has been started on Vancomycin and ID is on board.   Assessment & Plan:   Principal Problem:   Bacteremia Active Problems:   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)   Hypotension   ESRD on dialysis ([place])   Sepsis ([place])   Chronic hepatitis C without hepatic coma ([name])   HIV (human immunodeficiency virus infection) ([place])   Diabetic foot ulcer ([place])   MRSA Bacteremia Source is likely right foot wound. Question of abscess on MRI. Infectious disease on board. No vegetations on Transthoracic Echocardiogram. Fever overnight. WBC normal. -ID recommendations: Vancomycin, Transesophageal Echocardiogram (2/10) -Repeat blood cultures (06/19/2018) no growth to date  Sepsis Septic shock Present on admission. Secondary to bacteremia. Physiology resolved.  ESRD on HD -Nephrology for HD  Diabetes mellitus Hyperglycemia. History of 70/30 use in the past. Patient's hemoglobin A1C is 9%, down from 13.4% 7 months prior -Continue SSI  Chronic hepatitis C HIV infection -Continue Biktarvy -ID outpatient follow-up  Diabetic foot ulcer MRI foot negative for osteomyelitis. Possible abscess. Not a pressure ulcer -Orthopedic surgery recommendations pending   DVT prophylaxis: Heparin subq Code Status:   Code Status: Full Code Family Communication: None at bedside Disposition Plan: Discharge pending bacteremia workup   Consultants:   Infectious disease  Cardiology  Procedures:   Transthoracic Echocardiogram (06/18/2018) IMPRESSIONS    1. The left ventricle has normal systolic function of 50-09%. The  cavity size was normal. There is no increased left ventricular wall thickness. Echo evidence of pseudonormalization in diastolic relaxation.  2. The right ventricle has normal systolic function. The cavity was normal. There is no increase in right ventricular wall thickness.  3. Left atrial size was mildly dilated.  4. The mitral valve is normal in structure. No evidence of mitral valve stenosis. Trivial mitral regurgitation.  5. The tricuspid valve is normal in structure.  6. The aortic valve is tricuspid. No aortic stenosis.  7. No evidence of left ventricular regional wall motion abnormalities.  8. The inferior vena cava is normal in size with greater than 50% respiratory variability  9. No complete TR doppler jet so unable to estimate PA systolic pressure.  Antimicrobials:  Vancomycin (2/4>>  Biktarvy   Subjective: Interpreter services used.  Febrile overnight. Stump pain improved.  Objective: Vitals:   06/21/18 0002 06/21/18 0456 06/21/18 0500 06/21/18 0757  BP: 95/62 (!) 93/45  105/60  Pulse: 92 85  92  Resp: 15 14    Temp: 99.9 F (37.7 C) 98.5 F (36.9 C)  99.3 F (37.4 C)  TempSrc: Oral Oral  Oral  SpO2: 97% 96%  99%  Weight:   49.2 kg   Height:   5\' 1"  (1.549 m)     Intake/Output Summary (Last 24 hours) at 06/21/2018 1040 Last data filed at 06/20/2018 2250 Gross per 24 hour  Intake 120 ml  Output 1223 ml  Net -1103 ml   Filed Weights   06/20/18 0945 06/20/18 1355 06/21/18 0500  Weight: 52.6 kg 51.3 kg 49.2 kg    Examination:  General exam: Appears calm and comfortable Respiratory system: Clear to auscultation. Respiratory effort normal. Cardiovascular system: S1 & S2 heard, RRR.  No murmurs, rubs, gallops or clicks. Gastrointestinal system: Abdomen is nondistended, soft and nontender. No organomegaly or masses felt. Normal bowel sounds heard. Central nervous system: Alert and oriented. No focal neurological deficits. Extremities: No calf tenderness, left  BKA Skin: No cyanosis. Hypopigmented patches on right leg Psychiatry: Judgement and insight appear normal. Mood & affect appropriate.    Data Reviewed: I have personally reviewed following labs and imaging studies  CBC: Recent Labs  Lab 06/16/18 2300 06/18/18 0647 06/19/18 1932 06/20/18 1006  WBC 9.2 11.6* 11.7* 9.0  NEUTROABS 7.0  --   --   --   HGB 10.5* 9.5* 9.8* 9.1*  HCT 33.1* 29.1* 29.8* 28.5*  MCV 100.6* 99.0 94.9 96.3  PLT 189 174 259 824   Basic Metabolic Panel: Recent Labs  Lab 06/16/18 2300 06/18/18 0647 06/19/18 0430 06/19/18 1932 06/20/18 1007  NA 135 138 136 135 132*  K 3.9 5.3* 4.5 4.2 4.4  CL 91* 101 91* 92* 91*  CO2 30 20* 23 25 21*  GLUCOSE 152* 205* 293* 290* 407*  BUN 27* 44* 31* 44* 52*  CREATININE 7.43* 10.67* 7.64* 8.80* 10.79*  CALCIUM 8.2* 8.2* 8.4* 8.6* 8.6*  PHOS  --  7.1*  --  5.5* 6.2*   GFR: Estimated Creatinine Clearance: 5.9 mL/min (A) (by C-G formula based on SCr of 10.79 mg/dL (H)). Liver Function Tests: Recent Labs  Lab 06/16/18 2302 06/18/18 0647 06/19/18 1932 06/20/18 1007  AST 25  --   --   --   ALT 13  --   --   --   ALKPHOS 76  --   --   --   BILITOT 0.6  --   --   --   PROT 8.9*  --   --   --   ALBUMIN 3.2* 2.3* 2.3* 2.3*   No results for input(s): LIPASE, AMYLASE in the last 168 hours. No results for input(s): AMMONIA in the last 168 hours. Coagulation Profile: No results for input(s): INR, PROTIME in the last 168 hours. Cardiac Enzymes: Recent Labs  Lab 06/17/18 0830 06/17/18 1450  TROPONINI <0.03 <0.03   BNP (last 3 results) No results for input(s): PROBNP in the last 8760 hours. HbA1C: No results for input(s): HGBA1C in the last 72 hours. CBG: Recent Labs  Lab 06/20/18 0729 06/20/18 1449 06/20/18 1629 06/20/18 2101 06/21/18 0754  GLUCAP 269* 161* 201* 251* 291*   Lipid Profile: No results for input(s): CHOL, HDL, LDLCALC, TRIG, CHOLHDL, LDLDIRECT in the last 72 hours. Thyroid Function  Tests: No results for input(s): TSH, T4TOTAL, FREET4, T3FREE, THYROIDAB in the last 72 hours. Anemia Panel: No results for input(s): VITAMINB12, FOLATE, FERRITIN, TIBC, IRON, RETICCTPCT in the last 72 hours. Sepsis Labs: Recent Labs  Lab 06/17/18 0138 06/17/18 0500  LATICACIDVEN 2.9* 0.8    Recent Results (from the past 240 hour(s))  Blood Culture (routine x 2)     Status: Abnormal   Collection Time: 06/17/18  2:14 AM  Result Value Ref Range Status   Specimen Description BLOOD RIGHT ARM  Final   Special Requests   Final    BOTTLES DRAWN AEROBIC AND ANAEROBIC Blood Culture adequate volume Performed at [facility], [place] [address] [place], [place] [address]    Culture  Setup Time   Final    GRAM POSITIVE COCCI AEROBIC BOTTLE ONLY CRITICAL VALUE NOTED.  VALUE IS CONSISTENT WITH PREVIOUSLY REPORTED AND CALLED VALUE.    Culture (A)  Final  STAPHYLOCOCCUS AUREUS SUSCEPTIBILITIES PERFORMED ON PREVIOUS CULTURE WITHIN THE LAST 5 DAYS. Performed at [facility], [place] [address], [place], [place] [address]    Report Status 06/20/2018 FINAL  Final  Blood Culture (routine x 2)     Status: Abnormal   Collection Time: 06/17/18  2:14 AM  Result Value Ref Range Status   Specimen Description   Final    BLOOD RIGHT HAND Performed at [place] [address] [place], [place] [address]    Special Requests   Final    BOTTLES DRAWN AEROBIC AND ANAEROBIC Blood Culture adequate volume Performed at [facility] [address] [place], [place] [address]    Culture  Setup Time   Final    IN BOTH AEROBIC AND ANAEROBIC BOTTLES GRAM POSITIVE COCCI CRITICAL RESULT CALLED TO, READ BACK BY AND VERIFIED WITH: C AMEND [facility] 06/18/18 0135 JDW Performed at [facility], [place] [address] [place], [place] [address]    Culture METHICILLIN RESISTANT STAPHYLOCOCCUS AUREUS (A)  Final   Report Status 06/20/2018 FINAL  Final   Organism  ID, Bacteria METHICILLIN RESISTANT STAPHYLOCOCCUS AUREUS  Final      Susceptibility   Methicillin resistant staphylococcus aureus - MIC*    CIPROFLOXACIN <=0.5 SENSITIVE Sensitive     ERYTHROMYCIN >=8 RESISTANT Resistant     GENTAMICIN <=0.5 SENSITIVE Sensitive     OXACILLIN >=4 RESISTANT Resistant     TETRACYCLINE >=16 RESISTANT Resistant     VANCOMYCIN <=0.5 SENSITIVE Sensitive     TRIMETH/SULFA <=10 SENSITIVE Sensitive     CLINDAMYCIN <=0.25 SENSITIVE Sensitive     RIFAMPIN <=0.5 SENSITIVE Sensitive     Inducible Clindamycin NEGATIVE Sensitive     * METHICILLIN RESISTANT STAPHYLOCOCCUS AUREUS  Blood Culture ID Panel (Reflexed)     Status: Abnormal   Collection Time: 06/17/18  2:14 AM  Result Value Ref Range Status   Enterococcus species NOT DETECTED NOT DETECTED Final   Listeria monocytogenes NOT DETECTED NOT DETECTED Final   Staphylococcus species DETECTED (A) NOT DETECTED Final    Comment: CRITICAL RESULT CALLED TO, READ BACK BY AND VERIFIED WITH: C AMEND PHARMD 06/18/18 0133 JDW    Staphylococcus aureus (BCID) DETECTED (A) NOT DETECTED Final    Comment: Methicillin (oxacillin)-resistant Staphylococcus aureus (MRSA). MRSA is predictably resistant to beta-lactam antibiotics (except ceftaroline). Preferred therapy is vancomycin unless clinically contraindicated. Patient requires contact precautions if  hospitalized. CRITICAL RESULT CALLED TO, READ BACK BY AND VERIFIED WITH: C AMEND PHARMD 06/18/18 0133 JDW    Methicillin resistance DETECTED (A) NOT DETECTED Final    Comment: CRITICAL RESULT CALLED TO, READ BACK BY AND VERIFIED WITH: C AMEND PHARMD 06/18/18 0133 JDW    Streptococcus species NOT DETECTED NOT DETECTED Final   Streptococcus agalactiae NOT DETECTED NOT DETECTED Final   Streptococcus pneumoniae NOT DETECTED NOT DETECTED Final   Streptococcus pyogenes NOT DETECTED NOT DETECTED Final   Acinetobacter baumannii NOT DETECTED NOT DETECTED Final   Enterobacteriaceae species NOT  DETECTED NOT DETECTED Final   Enterobacter cloacae complex NOT DETECTED NOT DETECTED Final   Escherichia coli NOT DETECTED NOT DETECTED Final   Klebsiella oxytoca NOT DETECTED NOT DETECTED Final   Klebsiella pneumoniae NOT DETECTED NOT DETECTED Final   Proteus species NOT DETECTED NOT DETECTED Final   Serratia marcescens NOT DETECTED NOT DETECTED Final   Haemophilus influenzae NOT DETECTED NOT DETECTED Final   Neisseria meningitidis NOT DETECTED NOT DETECTED Final   Pseudomonas aeruginosa NOT DETECTED  NOT DETECTED Final   Candida albicans NOT DETECTED NOT DETECTED Final   Candida glabrata NOT DETECTED NOT DETECTED Final   Candida krusei NOT DETECTED NOT DETECTED Final   Candida parapsilosis NOT DETECTED NOT DETECTED Final   Candida tropicalis NOT DETECTED NOT DETECTED Final    Comment: Performed at [facility], [place] [address], [place], [place] [address]  Culture, blood (routine x 2)     Status: None (Preliminary result)   Collection Time: 06/19/18  3:50 PM  Result Value Ref Range Status   Specimen Description BLOOD RIGHT ARM  Final   Special Requests   Final    BOTTLES DRAWN AEROBIC AND ANAEROBIC Blood Culture adequate volume   Culture   Final    NO GROWTH 1 DAY Performed at [facility], [place] [address], [place], [place] [address]    Report Status PENDING  Incomplete  Culture, blood (routine x 2)     Status: None (Preliminary result)   Collection Time: 06/19/18  4:00 PM  Result Value Ref Range Status   Specimen Description BLOOD RIGHT HAND  Final   Special Requests   Final    BOTTLES DRAWN AEROBIC ONLY Blood Culture adequate volume   Culture   Final    NO GROWTH 1 DAY Performed at [facility], [place] [address], [place], [place] [address]    Report Status PENDING  Incomplete         Radiology Studies: Mr Foot Right Wo Contrast  Result Date: 06/19/2018 CLINICAL DATA:  Pressure ulcer on the heel of the right foot. MRSA bacteremia. EXAM: MRI OF THE RIGHT  FOOT WITHOUT CONTRAST TECHNIQUE: Multiplanar, multisequence MR imaging of the right foot was performed. No intravenous contrast was administered. COMPARISON:  Radiographs dated 06/18/2018 and MRI dated 03/05/2016 FINDINGS: Bones/Joint/Cartilage There is a chronic comminuted fracture of the calcaneus with further distraction of the fragments since the prior study. There are new slight arthritic changes of distal talus. The other bones of the foot demonstrate no significant abnormalities. There is a small ankle effusion and a subtalar joint effusion which extends into the soft tissues of the plantar aspect of the heel through the distracted chronic fracture of the calcaneus. Ligaments Intact. Muscles and Tendons There is fluid in the tendon sheaths of the peroneal tendons extending from proximal to the lateral malleolus to the level of the base of the fifth metatarsal. There is also a fluid collection deep to the abductor digiti minimi tendon along the fifth metatarsal. There is fluid extending from the subtalar joint through the chronic fracture of the calcaneus into the plantar soft tissues along the flexor tendons in the midfoot. Soft tissues There is a superficial soft tissue ulceration on the medial aspect of the heel without deep extension. This does not appear to communicate with the underlying fluid collection from the subtalar joint. IMPRESSION: 1. No findings suggestive of osteomyelitis of the foot or ankle. 2. 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 3. Multiple fluid collections in the foot some of which originate from the subtalar joint and some of which represent tenosynovitis of the peroneal tendons and some of which may represent pseudobursitis deep to the abductor digiti minimi tendon. I doubt that these represent abscesses. However, given the bacteremia, there is potential for secondary infection of these fluid collections. 4. Superficial soft tissue ulceration of the heel with no  underlying abscess or osteomyelitis. Electronically Signed   By: Lorriane Shire M.D.   On:  06/19/2018 13:42        Scheduled Meds: . bictegravir-emtricitabine-tenofovir AF  1 tablet Oral Daily  . Chlorhexidine Gluconate Cloth  6 each Topical Q0600  . doxercalciferol  3 mcg Intravenous Q T,Th,Sa-HD  . ferric citrate  420 mg Oral TID WC  . heparin  5,000 Units Subcutaneous Q8H  . insulin aspart  0-15 Units Subcutaneous TID WC  . insulin aspart  0-5 Units Subcutaneous QHS  . midodrine  5 mg Oral TID WC  . vancomycin  500 mg Intravenous Q T,Th,Sa-HD   Continuous Infusions: . sodium chloride       LOS: 4 days     Cordelia Poche, MD [facility] 06/21/2018, 10:40 AM  If 7PM-7AM, please contact night-coverage [email or web address]

## 2018-06-22 ENCOUNTER — Inpatient Hospital Stay (HOSPITAL_COMMUNITY): Payer: Medicaid Other | Admitting: Certified Registered Nurse Anesthetist

## 2018-06-22 ENCOUNTER — Encounter (HOSPITAL_COMMUNITY)
Admission: EM | Disposition: A | Discharge status: Home / Self Care | Payer: Self-pay | Source: Home / Self Care | Attending: Family Medicine

## 2018-06-22 ENCOUNTER — Encounter (HOSPITAL_COMMUNITY): Payer: Self-pay | Admitting: Certified Registered Nurse Anesthetist

## 2018-06-22 ENCOUNTER — Inpatient Hospital Stay (HOSPITAL_COMMUNITY): Payer: Medicaid Other

## 2018-06-22 DIAGNOSIS — R7881 Bacteremia: Secondary | ICD-10-CM

## 2018-06-22 DIAGNOSIS — M25471 Effusion, right ankle: Secondary | ICD-10-CM

## 2018-06-22 DIAGNOSIS — B9562 Methicillin resistant Staphylococcus aureus infection as the cause of diseases classified elsewhere: Secondary | ICD-10-CM

## 2018-06-22 DIAGNOSIS — R509 Fever, unspecified: Secondary | ICD-10-CM

## 2018-06-22 HISTORY — PX: TEE WITHOUT CARDIOVERSION: SHX5443

## 2018-06-22 LAB — GLUCOSE, CAPILLARY
Glucose-Capillary: 341 mg/dL — ABNORMAL HIGH (ref 70–99)
Glucose-Capillary: 236 mg/dL — ABNORMAL HIGH (ref 70–99)
Glucose-Capillary: 187 mg/dL — ABNORMAL HIGH (ref 70–99)
Glucose-Capillary: 436 mg/dL — ABNORMAL HIGH (ref 70–99)

## 2018-06-22 LAB — CBC
HCT: 31.2 % — ABNORMAL LOW (ref 39.0–52.0)
Hemoglobin: 10 g/dL — ABNORMAL LOW (ref 13.0–17.0)
MCH: 30.9 pg (ref 26.0–34.0)
MCHC: 32.1 g/dL (ref 30.0–36.0)
MCV: 96.3 fL (ref 80.0–100.0)
PLATELETS: 285 10*3/uL (ref 150–400)
RBC: 3.24 MIL/uL — ABNORMAL LOW (ref 4.22–5.81)
RDW: 13.8 % (ref 11.5–15.5)
WBC: 9.7 10*3/uL (ref 4.0–10.5)
nRBC: 0 % (ref 0.0–0.2)

## 2018-06-22 LAB — BASIC METABOLIC PANEL
Anion gap: 15 (ref 5–15)
BUN: 38 mg/dL — ABNORMAL HIGH (ref 6–20)
CO2: 22 mmol/L (ref 22–32)
Calcium: 8.6 mg/dL — ABNORMAL LOW (ref 8.9–10.3)
Chloride: 97 mmol/L — ABNORMAL LOW (ref 98–111)
Creatinine, Ser: 8.95 mg/dL — ABNORMAL HIGH (ref 0.61–1.24)
GFR calc Af Amer: 7 mL/min — ABNORMAL LOW (ref >60)
GFR calc non Af Amer: 6 mL/min — ABNORMAL LOW (ref >60)
Glucose, Bld: 301 mg/dL — ABNORMAL HIGH (ref 70–99)
Potassium: 4.6 mmol/L (ref 3.5–5.1)
Sodium: 134 mmol/L — ABNORMAL LOW (ref 135–145)

## 2018-06-22 SURGERY — ECHOCARDIOGRAM, TRANSESOPHAGEAL
Anesthesia: Monitor Anesthesia Care

## 2018-06-22 MED ORDER — INSULIN ASPART 100 UNIT/ML ~~LOC~~ SOLN
10.0000 [IU] | Freq: Once | SUBCUTANEOUS | Status: AC
  Administered 2018-06-22: 10 [IU] via SUBCUTANEOUS

## 2018-06-22 MED ORDER — BUTAMBEN-TETRACAINE-BENZOCAINE 2-2-14 % EX AERO
INHALATION_SPRAY | CUTANEOUS | Status: DC | PRN
  Administered 2018-06-22: 1 via TOPICAL

## 2018-06-22 MED ORDER — PROPOFOL 500 MG/50ML IV EMUL
INTRAVENOUS | Status: DC | PRN
  Administered 2018-06-22: 125 ug/kg/min via INTRAVENOUS

## 2018-06-22 MED ORDER — CHLORHEXIDINE GLUCONATE CLOTH 2 % EX PADS
6.0000 | MEDICATED_PAD | Freq: Every day | CUTANEOUS | Status: DC
  Administered 2018-06-23: 6 via TOPICAL

## 2018-06-22 MED ORDER — PROPOFOL 10 MG/ML IV BOLUS
INTRAVENOUS | Status: DC | PRN
  Administered 2018-06-22 (×2): 40 mg via INTRAVENOUS
  Administered 2018-06-22: 20 mg via INTRAVENOUS

## 2018-06-22 NOTE — CV Procedure (Signed)
   Transesophageal Echocardiogram  Indications: Bacteremia  Time out performed   Anesthesia propofol  Findings:  Left Ventricle: Normal EF 60%  Mitral Valve: Normal  Aortic Valve: Normal   Tricuspid Valve: Normal  Left Atrium: Normal  IMPRESSION: NO ENDOCARDITIS  Candee Furbish, MD

## 2018-06-22 NOTE — Transfer of Care (Signed)
Immediate Anesthesia Transfer of Care Note  Patient: Alvin Daniels  Procedure(s) Performed: TRANSESOPHAGEAL ECHOCARDIOGRAM (TEE) (N/A )  Patient Location: Endoscopy Unit  Anesthesia Type:MAC  Level of Consciousness: awake, alert  and oriented  Airway & Oxygen Therapy: Patient Spontanous Breathing and Patient connected to nasal cannula oxygen  Post-op Assessment: Report given to RN and Post -op Vital signs reviewed and stable  Post vital signs: Reviewed and stable  Last Vitals:  Vitals Value Taken Time  BP 100/44   Temp    Pulse 75 06/22/2018  3:09 PM  Resp 25 06/22/2018  3:09 PM  SpO2 100 % 06/22/2018  3:09 PM    Last Pain:  Vitals:   06/22/18 1324  TempSrc: Oral  PainSc: 0-No pain      Patients Stated Pain Goal: 0 (12/02/55 5051)  Complications: No apparent anesthesia complications

## 2018-06-22 NOTE — Progress Notes (Signed)
Subjective:  Patient feels better  Antibiotics:  Anti-infectives (From admission, onward)   Start     Dose/Rate Route Frequency Ordered Stop   06/18/18 1200  vancomycin (VANCOCIN) IVPB 500 mg/100 ml premix     500 mg 100 mL/hr over 60 Minutes Intravenous Every T-Th-Sa (Hemodialysis) 06/18/18 0141     06/18/18 0940  vancomycin (VANCOCIN) 500-5 MG/100ML-% IVPB    Note to Pharmacy:  Rodell Perna   : cabinet override      06/18/18 0940 06/18/18 1116   06/18/18 0200  ceFEPIme (MAXIPIME) 1 g in sodium chloride 0.9 % 100 mL IVPB  Status:  Discontinued     1 g 200 mL/hr over 30 Minutes Intravenous Every 24 hours 06/17/18 0745 06/18/18 0215   06/17/18 1500  bictegravir-emtricitabine-tenofovir AF (BIKTARVY) 50-200-25 MG per tablet 1 tablet     1 tablet Oral Daily 06/17/18 0819     06/17/18 0743  vancomycin variable dose per unstable renal function (pharmacist dosing)  Status:  Discontinued      Does not apply See admin instructions 06/17/18 0745 06/18/18 0141   06/17/18 0145  ceFEPIme (MAXIPIME) 2 g in sodium chloride 0.9 % 100 mL IVPB     2 g 200 mL/hr over 30 Minutes Intravenous  Once 06/17/18 0138 06/17/18 0309   06/17/18 0145  metroNIDAZOLE (FLAGYL) IVPB 500 mg  Status:  Discontinued     500 mg 100 mL/hr over 60 Minutes Intravenous Every 8 hours 06/17/18 0138 06/18/18 0215   06/17/18 0145  vancomycin (VANCOCIN) IVPB 1000 mg/200 mL premix  Status:  Discontinued     1,000 mg 200 mL/hr over 60 Minutes Intravenous  Once 06/17/18 0138 06/17/18 0143   06/17/18 0145  vancomycin (VANCOCIN) 1,250 mg in sodium chloride 0.9 % 250 mL IVPB     1,250 mg 166.7 mL/hr over 90 Minutes Intravenous  Once 06/17/18 0143 06/17/18 0656      Medications: Scheduled Meds: . bictegravir-emtricitabine-tenofovir AF  1 tablet Oral Daily  . Chlorhexidine Gluconate Cloth  6 each Topical Q0600  . [START ON 06/23/2018] Chlorhexidine Gluconate Cloth  6 each Topical Q0600  . doxercalciferol  3 mcg  Intravenous Q T,Th,Sa-HD  . ferric citrate  420 mg Oral TID WC  . heparin  5,000 Units Subcutaneous Q8H  . insulin aspart  0-15 Units Subcutaneous TID WC  . insulin aspart  0-5 Units Subcutaneous QHS  . midodrine  5 mg Oral TID WC  . vancomycin  500 mg Intravenous Q T,Th,Sa-HD   Continuous Infusions: PRN Meds:.acetaminophen, ondansetron (ZOFRAN) IV, oxyCODONE    Objective: Weight change: -0.9 kg  Intake/Output Summary (Last 24 hours) at 06/22/2018 2128 Last data filed at 06/22/2018 1832 Gross per 24 hour  Intake 790.58 ml  Output -  Net 790.58 ml   Blood pressure (!) 112/56, pulse 92, temperature 99.7 F (37.6 C), temperature source Oral, resp. rate 18, height 5\' 1"  (1.549 m), weight 51.7 kg, SpO2 94 %. Temp:  [98.3 F (36.8 C)-100.4 F (38 C)] 99.7 F (37.6 C) (02/10 2012) Pulse Rate:  [75-92] 92 (02/10 2012) Resp:  [14-25] 18 (02/10 2012) BP: (85-145)/(44-74) 112/56 (02/10 2012) SpO2:  [94 %-100 %] 94 % (02/10 2012) Weight:  [51.7 kg] 51.7 kg (02/10 1324)  Physical Exam: General: Alert and awake, oriented x3, not in any acute distress. HEENT: anicteric sclera, EOMI CVS regular rate, normal no murmurs gallops or rubs Chest: , no wheezing, no respiratory distress Abdomen: soft non-distended,  Extremities: Right lower extremity is unchanged Skin: Fistula site is clean Neuro: nonfocal  CBC:    BMET Recent Labs    06/20/18 1007 06/22/18 0443  NA 132* 134*  K 4.4 4.6  CL 91* 97*  CO2 21* 22  GLUCOSE 407* 301*  BUN 52* 38*  CREATININE 10.79* 8.95*  CALCIUM 8.6* 8.6*     Liver Panel  Recent Labs    06/20/18 1007  ALBUMIN 2.3*       Sedimentation Rate No results for input(s): ESRSEDRATE in the last 72 hours. C-Reactive Protein No results for input(s): CRP in the last 72 hours.  Micro Results: Recent Results (from the past 720 hour(s))  Blood Culture (routine x 2)     Status: Abnormal   Collection Time: 06/17/18  2:14 AM  Result Value Ref  Range Status   Specimen Description BLOOD RIGHT ARM  Final   Special Requests   Final    BOTTLES DRAWN AEROBIC AND ANAEROBIC Blood Culture adequate volume Performed at [facility] [address], [name], [place] [address]    Culture  Setup Time   Final    GRAM POSITIVE COCCI AEROBIC BOTTLE ONLY CRITICAL VALUE NOTED.  VALUE IS CONSISTENT WITH PREVIOUSLY REPORTED AND CALLED VALUE.    Culture (A)  Final    STAPHYLOCOCCUS AUREUS SUSCEPTIBILITIES PERFORMED ON PREVIOUS CULTURE WITHIN THE LAST 5 DAYS. Performed at [facility], [place] [address], [place], [place] [address]    Report Status 06/20/2018 FINAL  Final  Blood Culture (routine x 2)     Status: Abnormal   Collection Time: 06/17/18  2:14 AM  Result Value Ref Range Status   Specimen Description   Final    BLOOD RIGHT HAND Performed at [facility] [address], [place], [place] [address]    Special Requests   Final    BOTTLES DRAWN AEROBIC AND ANAEROBIC Blood Culture adequate volume Performed at [facility] [address], [place], [place] [address]    Culture  Setup Time   Final    IN BOTH AEROBIC AND ANAEROBIC BOTTLES GRAM POSITIVE COCCI CRITICAL RESULT CALLED TO, READ BACK BY AND VERIFIED WITH: C AMEND [facility] 06/18/18 0135 JDW Performed at [facility], [place] [address] [place], [place] [address]    Culture METHICILLIN RESISTANT STAPHYLOCOCCUS AUREUS (A)  Final   Report Status 06/20/2018 FINAL  Final   Organism ID, Bacteria METHICILLIN RESISTANT STAPHYLOCOCCUS AUREUS  Final      Susceptibility   Methicillin resistant staphylococcus aureus - MIC*    CIPROFLOXACIN <=0.5 SENSITIVE Sensitive     ERYTHROMYCIN >=8 RESISTANT Resistant     GENTAMICIN <=0.5 SENSITIVE Sensitive     OXACILLIN >=4 RESISTANT Resistant     TETRACYCLINE >=16 RESISTANT Resistant     VANCOMYCIN <=0.5 SENSITIVE Sensitive     TRIMETH/SULFA <=10 SENSITIVE Sensitive     CLINDAMYCIN  <=0.25 SENSITIVE Sensitive     RIFAMPIN <=0.5 SENSITIVE Sensitive     Inducible Clindamycin NEGATIVE Sensitive     * METHICILLIN RESISTANT STAPHYLOCOCCUS AUREUS  Blood Culture ID Panel (Reflexed)     Status: Abnormal   Collection Time: 06/17/18  2:14 AM  Result Value Ref Range Status   Enterococcus species NOT DETECTED NOT DETECTED Final   Listeria monocytogenes NOT DETECTED NOT DETECTED Final   Staphylococcus species DETECTED (A) NOT DETECTED Final    Comment: CRITICAL RESULT CALLED TO, READ BACK BY AND VERIFIED WITH: C AMEND PHARMD  06/18/18 0133 JDW    Staphylococcus aureus (BCID) DETECTED (A) NOT DETECTED Final    Comment: Methicillin (oxacillin)-resistant Staphylococcus aureus (MRSA). MRSA is predictably resistant to beta-lactam antibiotics (except ceftaroline). Preferred therapy is vancomycin unless clinically contraindicated. Patient requires contact precautions if  hospitalized. CRITICAL RESULT CALLED TO, READ BACK BY AND VERIFIED WITH: C AMEND PHARMD 06/18/18 0133 JDW    Methicillin resistance DETECTED (A) NOT DETECTED Final    Comment: CRITICAL RESULT CALLED TO, READ BACK BY AND VERIFIED WITH: C AMEND PHARMD 06/18/18 0133 JDW    Streptococcus species NOT DETECTED NOT DETECTED Final   Streptococcus agalactiae NOT DETECTED NOT DETECTED Final   Streptococcus pneumoniae NOT DETECTED NOT DETECTED Final   Streptococcus pyogenes NOT DETECTED NOT DETECTED Final   Acinetobacter baumannii NOT DETECTED NOT DETECTED Final   Enterobacteriaceae species NOT DETECTED NOT DETECTED Final   Enterobacter cloacae complex NOT DETECTED NOT DETECTED Final   Escherichia coli NOT DETECTED NOT DETECTED Final   Klebsiella oxytoca NOT DETECTED NOT DETECTED Final   Klebsiella pneumoniae NOT DETECTED NOT DETECTED Final   Proteus species NOT DETECTED NOT DETECTED Final   Serratia marcescens NOT DETECTED NOT DETECTED Final   Haemophilus influenzae NOT DETECTED NOT DETECTED Final   Neisseria meningitidis NOT  DETECTED NOT DETECTED Final   Pseudomonas aeruginosa NOT DETECTED NOT DETECTED Final   Candida albicans NOT DETECTED NOT DETECTED Final   Candida glabrata NOT DETECTED NOT DETECTED Final   Candida krusei NOT DETECTED NOT DETECTED Final   Candida parapsilosis NOT DETECTED NOT DETECTED Final   Candida tropicalis NOT DETECTED NOT DETECTED Final    Comment: Performed at [facility], [place]. [address], [place], [place] [address]  Culture, blood (routine x 2)     Status: None (Preliminary result)   Collection Time: 06/19/18  3:50 PM  Result Value Ref Range Status   Specimen Description BLOOD RIGHT ARM  Final   Special Requests   Final    BOTTLES DRAWN AEROBIC AND ANAEROBIC Blood Culture adequate volume   Culture   Final    NO GROWTH 3 DAYS Performed at [facility], [address], [place], [place] [address]    Report Status PENDING  Incomplete  Culture, blood (routine x 2)     Status: None (Preliminary result)   Collection Time: 06/19/18  4:00 PM  Result Value Ref Range Status   Specimen Description BLOOD RIGHT HAND  Final   Special Requests   Final    BOTTLES DRAWN AEROBIC ONLY Blood Culture adequate volume   Culture   Final    NO GROWTH 3 DAYS Performed at [facility], [place] [address], [place], [place] [address]    Report Status PENDING  Incomplete    Studies/Results: No results found.    Assessment/Plan:  INTERVAL HISTORY: MRI does not show osteomyelitis in the ankle but does show some fluid collections.   Principal Problem:   Bacteremia Active Problems:   Diabetes mellitus with ESRD (end-stage renal disease) ([place])   Hypotension   ESRD on dialysis ([place])   Sepsis ([place])   Chronic hepatitis C without hepatic coma ([place])   HIV (human immunodeficiency virus infection) ([place])   Diabetic foot ulcer ([place])    Alvin Daniels is a 48 y.o. male with HIV which is perfectly well controlled, end-stage renal disease on hemodialysis, history of left below the  knee amputation, now admitted with MRSA bacteremia and sepsis.  1.  MRSA bacteremia:  [facility] Staphylococcus aureus bacteremia   Staphylococcus aureus bacteremia (SAB) is associated with a high rate of complications and mortality.  Specific aspects of clinical management are critical to optimizing the outcome of patients with SAB.  Therefore, the [facility] Antimicrobial Management Team [facility]) has initiated an intervention aimed at improving the management of SAB at [facility].  To do so, Infectious Diseases physicians are providing an evidence-based consult for the management of all patients with SAB.     Yes No Comments  Perform follow-up blood cultures (even if the patient is afebrile) to ensure clearance of bacteremia [x]  []   blood cultures taken Friday no growth  Remove vascular catheter and obtain follow-up blood cultures after the removal of the catheter []  []   no catheters in place and fistula does not appear infected  Perform echocardiography to evaluate for endocarditis (transthoracic ECHO is 40-50% sensitive, TEE is > 90% sensitive) [x]  []  TEE clean       Ensure source control [x]  []  Continue to monitor right foot, may need repeat imagine if not improving and fevers persisting   Investigate for "metastatic" sites of infection [x]  []  Does the patient have ANY symptom or physical exam finding that would suggest a deeper infection (back or neck pain that may be suggestive of vertebral osteomyelitis or epidural abscess, muscle pain that could be a symptom of pyomyositis)?  Keep in mind that for deep seeded infections MRI imaging with contrast is preferred rather than other often insensitive tests such as plain x-rays, especially early in a patient's presentation.  Change antibiotic therapy to vancomycin []  []  Beta-lactam antibiotics are preferred for MSSA due to higher cure rates.   If on Vancomycin, goal trough should be 15 - 20 mcg/mL    Estimated duration of IV antibiotic therapy:  4-6 weeks with HD []  []  Consult case management for probably prolonged outpatient IV antibiotic therapy   #2 HIV: continue Biktarvy, he has completed HMAP paperwork.  #3 Need for prosthesis: I do find out where he is getting the prosthesis so we can call that clinic in case he is in danger of missing that appointment.  If we are not able accomplish this and he is still without bacteremia on Tuesday and we have gotten his transesophageal echocardiogram I would be comfortable with him being able to discharged home Tuesday night to make his appointment Wednesday morning.   #4 Fevers: hopefully will be come quiescent. He has certainly cleared his bacteremia rapidl    LOS: 5 days   Alcide Evener 06/22/2018, 9:28 PM

## 2018-06-22 NOTE — Anesthesia Postprocedure Evaluation (Signed)
Anesthesia Post Note  Patient: Alfie Shearer  Procedure(s) Performed: TRANSESOPHAGEAL ECHOCARDIOGRAM (TEE) (N/A )     Patient location during evaluation: PACU Anesthesia Type: MAC Level of consciousness: awake and alert Pain management: pain level controlled Vital Signs Assessment: post-procedure vital signs reviewed and stable Respiratory status: spontaneous breathing, nonlabored ventilation and respiratory function stable Cardiovascular status: blood pressure returned to baseline and stable Postop Assessment: no apparent nausea or vomiting Anesthetic complications: no    Last Vitals:  Vitals:   06/22/18 1530 06/22/18 1540  BP: (!) 101/44 (!) 98/44  Pulse: 80   Resp: 19 16  Temp:    SpO2: 98% 98%    Last Pain:  Vitals:   06/22/18 1540  TempSrc:   PainSc: 0-No pain                 Brennan Bailey

## 2018-06-22 NOTE — Progress Notes (Signed)
Inpatient Diabetes Program Recommendations  AACE/ADA: New Consensus Statement on Inpatient Glycemic Control (2015)  Target Ranges:  Prepandial:   less than 140 mg/dL      Peak postprandial:   less than 180 mg/dL (1-2 hours)      Critically ill patients:  140 - 180 mg/dL   Lab Results  Component Value Date   GLUCAP 341 (H) 06/22/2018   HGBA1C 9.0 (H) 06/18/2018    Review of Glycemic Control Results for Alvin Daniels, Alvin Daniels (MRN [id number]) as of 06/22/2018 11:43  Ref. Range 06/21/2018 07:54 06/21/2018 11:19 06/21/2018 16:22 06/21/2018 21:52 06/22/2018 07:36  Glucose-Capillary Latest Ref Range: 70 - 99 mg/dL 291 (H) 388 (H) 328 (H) 260 (H) 341 (H)   Inpatient Diabetes Program Recommendations:   Noted CBGs elevated and patient is NPO. Please consider Levemir 5 units qd (0.1 unit/kg x 51.7 kg)  Thank you, Bethena Roys E. Kenly Xiao, RN, MSN, CDE  Diabetes Coordinator Inpatient Glycemic Control Team Team Pager [phone] (8am-5pm) 06/22/2018 11:44 AM

## 2018-06-22 NOTE — Interval H&P Note (Signed)
History and Physical Interval Note:  06/22/2018 2:24 PM  Alvin Daniels  has presented today for surgery, with the diagnosis of BACTEREMIA  The various methods of treatment have been discussed with the patient and family. After consideration of risks, benefits and other options for treatment, the patient has consented to  Procedure(s): TRANSESOPHAGEAL ECHOCARDIOGRAM (TEE) (N/A) as a surgical intervention .  The patient's history has been reviewed, patient examined, no change in status, stable for surgery.  I have reviewed the patient's chart and labs.  Questions were answered to the patient's satisfaction.     [employer]

## 2018-06-22 NOTE — Progress Notes (Signed)
[facility] NEPHROLOGY PROGRESS NOTE  Summary: Pt is a 48 y.o. yo male with DM, hep C, HIV, left BKA, chronic hypertension, ESRD TTS at [place] via AV fistula admitted with MRSA bacteremia.   NW TTS  4h  2/2 bath  400/800  53kg  AVF  Hep 2000 Mircera 50 mcg last dose 1/30 Hectorol 3 mcg every rx Last PTH 141, phos 6.8 on 06/04/2018   Assessment / Plan #MRSA bacteremia: unclear source. For TEE today. Will need 6 weeks of IV vanc postdialysis per ID.  #ESRD on HD TTS: last HD here Sat. Plan HD tomorrow. AVF w/o signs of infection on exam.   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  Monitor CBC.  No iron because of infection.  #Secondary hyperparathyroidism: Continue Hectorol during treatment.  Phosphorus elevated. Resume auryxia.   #Hypotension: Patient has chronic hypotension. On miodrine. BP better today.   Sandy Salaam Adrian Dinovo 06/22/2018,1:18 PM  LOS: 5 days   Subjective: Seen and examined.  Denies chest pain, shortness of breath, nausea vomiting.  Physical Exam: General: Not in distress, comfortable Heart:RRR, s1s2 nl Lungs: Clear bilateral, no wheezing or crackle Abdomen:soft, Non-tender, non-distended Extremities: No edema Dialysis Access: AV fistula has good thrill and bruit.  Objective Vital signs in last 24 hours: Vitals:   06/22/18 0451 06/22/18 0739 06/22/18 1138 06/22/18 1200  BP: 107/66 106/60 (!) 85/51 (!) 90/59  Pulse: 88 87 83   Resp: 14 14 14    Temp: 98.8 F (37.1 C) 98.5 F (36.9 C) 98.3 F (36.8 C)   TempSrc: Oral Oral Oral   SpO2: 96% 98% 99%   Weight: 51.7 kg     Height:       Weight change: -0.9 kg  Intake/Output Summary (Last 24 hours) at 06/22/2018 1318 Last data filed at 06/22/2018 1307 Gross per 24 hour  Intake 650.58 ml  Output -  Net 650.58 ml       Labs: Basic Metabolic Panel: Recent Labs  Lab 06/18/18 0647  06/19/18 1932 06/20/18 1007 06/22/18 0443  NA 138   < > 135 132* 134*   K 5.3*   < > 4.2 4.4 4.6  CL 101   < > 92* 91* 97*  CO2 20*   < > 25 21* 22  GLUCOSE 205*   < > 290* 407* 301*  BUN 44*   < > 44* 52* 38*  CREATININE 10.67*   < > 8.80* 10.79* 8.95*  CALCIUM 8.2*   < > 8.6* 8.6* 8.6*  PHOS 7.1*  --  5.5* 6.2*  --    < > = values in this interval not displayed.   Liver Function Tests: Recent Labs  Lab 06/16/18 2302 06/18/18 0647 06/19/18 1932 06/20/18 1007  AST 25  --   --   --   ALT 13  --   --   --   ALKPHOS 76  --   --   --   BILITOT 0.6  --   --   --   PROT 8.9*  --   --   --   ALBUMIN 3.2* 2.3* 2.3* 2.3*   No results for input(s): LIPASE, AMYLASE in the last 168 hours. No results for input(s): AMMONIA in the last 168 hours. CBC: Recent Labs  Lab 06/16/18 2300 06/18/18 0647 06/19/18 1932 06/20/18 1006 06/22/18 0443  WBC 9.2 11.6* 11.7* 9.0 9.7  NEUTROABS 7.0  --   --   --   --  HGB 10.5* 9.5* 9.8* 9.1* 10.0*  HCT 33.1* 29.1* 29.8* 28.5* 31.2*  MCV 100.6* 99.0 94.9 96.3 96.3  PLT 189 174 259 236 285   Cardiac Enzymes: Recent Labs  Lab 06/17/18 0830 06/17/18 1450  TROPONINI <0.03 <0.03   CBG: Recent Labs  Lab 06/21/18 1119 06/21/18 1622 06/21/18 2152 06/22/18 0736 06/22/18 1121  GLUCAP 388* 328* 260* 341* 236*    Iron Studies: No results for input(s): IRON, TIBC, TRANSFERRIN, FERRITIN in the last 72 hours. Studies/Results: No results found.  Medications: Infusions: . sodium chloride 20 mL/hr at 06/22/18 0600    Scheduled Medications: . [MAR Hold] bictegravir-emtricitabine-tenofovir AF  1 tablet Oral Daily  . [MAR Hold] Chlorhexidine Gluconate Cloth  6 each Topical Q0600  . [MAR Hold] doxercalciferol  3 mcg Intravenous Q T,Th,Sa-HD  . [MAR Hold] ferric citrate  420 mg Oral TID WC  . [MAR Hold] heparin  5,000 Units Subcutaneous Q8H  . [MAR Hold] insulin aspart  0-15 Units Subcutaneous TID WC  . [MAR Hold] insulin aspart  0-5 Units Subcutaneous QHS  . [MAR Hold] midodrine  5 mg Oral TID WC  . [MAR Hold]  vancomycin  500 mg Intravenous Q T,Th,Sa-HD    have reviewed scheduled and prn medications.

## 2018-06-22 NOTE — H&P (View-Only) (Signed)
PROGRESS NOTE    Alvin Daniels  UUV:[id number] DOB: [birth year] DOA: 06/16/2018 PCP: [name], MD   Brief Narrative: Alvin Daniels is a 48 y.o. male with a history of ESRD on HD, diabetes mellitus, chronic hepatitis C, HIV, s/p BKA. Patient presented secondary to fevers/chills/malaise and found to have sepsis and eventually MRSA bacteremia. He has been started on Vancomycin and ID is on board.   Assessment & Plan:   Principal Problem:   Bacteremia Active Problems:   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)   Hypotension   ESRD on dialysis ([place])   Sepsis ([place])   Chronic hepatitis C without hepatic coma ([name])   HIV (human immunodeficiency virus infection) ([place])   Diabetic foot ulcer ([place])   MRSA Bacteremia Source is likely right foot wound. Question of abscess on MRI. Infectious disease on board. No vegetations on Transthoracic Echocardiogram. Continues to have fevers. WBC normal. -ID recommendations: Vancomycin, Transesophageal Echocardiogram (2/10); further recommendations pending today -Repeat blood cultures (06/19/2018) no growth to date  Sepsis Septic shock Present on admission. Secondary to bacteremia. Physiology resolved.  ESRD on HD -Nephrology for HD  Diabetes mellitus Hyperglycemia. History of 70/30 use in the past. Patient's hemoglobin A1C is 9%, down from 13.4% 7 months prior -Continue SSI  Chronic hepatitis C HIV infection -Continue Biktarvy -ID outpatient follow-up  Diabetic foot ulcer MRI foot negative for osteomyelitis. Possible abscess. Not a pressure ulcer -Orthopedic surgery recommendations pending; will call again since patient is having fevers   DVT prophylaxis: Heparin subq Code Status:   Code Status: Full Code Family Communication: None at bedside Disposition Plan: Discharge pending bacteremia workup   Consultants:   Infectious disease  Cardiology  Procedures:   Transthoracic Echocardiogram  (06/18/2018) IMPRESSIONS    1. The left ventricle has normal systolic function of 47-42%. The cavity size was normal. There is no increased left ventricular wall thickness. Echo evidence of pseudonormalization in diastolic relaxation.  2. The right ventricle has normal systolic function. The cavity was normal. There is no increase in right ventricular wall thickness.  3. Left atrial size was mildly dilated.  4. The mitral valve is normal in structure. No evidence of mitral valve stenosis. Trivial mitral regurgitation.  5. The tricuspid valve is normal in structure.  6. The aortic valve is tricuspid. No aortic stenosis.  7. No evidence of left ventricular regional wall motion abnormalities.  8. The inferior vena cava is normal in size with greater than 50% respiratory variability  9. No complete TR doppler jet so unable to estimate PA systolic pressure.  Antimicrobials:  Vancomycin (2/4>>  Biktarvy   Subjective: Interpreter services used.  Febrile again overnight. No other issues.  Objective: Vitals:   06/22/18 0451 06/22/18 0739 06/22/18 1138 06/22/18 1200  BP: 107/66 106/60 (!) 85/51 (!) 90/59  Pulse: 88 87 83   Resp: 14 14 14    Temp: 98.8 F (37.1 C) 98.5 F (36.9 C) 98.3 F (36.8 C)   TempSrc: Oral Oral Oral   SpO2: 96% 98% 99%   Weight: 51.7 kg     Height:        Intake/Output Summary (Last 24 hours) at 06/22/2018 1248 Last data filed at 06/22/2018 0908 Gross per 24 hour  Intake 890.58 ml  Output -  Net 890.58 ml   Filed Weights   06/20/18 1355 06/21/18 0500 06/22/18 0451  Weight: 51.3 kg 49.2 kg 51.7 kg    Examination:  General exam: Appears calm and comfortable Respiratory system: Clear  to auscultation. Respiratory effort normal. Cardiovascular system: S1 & S2 heard, RRR. No murmurs, rubs, gallops or clicks. Gastrointestinal system: Abdomen is nondistended, soft and nontender. No organomegaly or masses felt. Normal bowel sounds heard. Central nervous  system: Alert and oriented. No focal neurological deficits. Extremities: Right foot edema with no tenderness and warmth that extends up his entire leg. Left BKA.  Skin: No cyanosis. No rashes Psychiatry: Judgement and insight appear normal. Mood & affect appropriate.    Data Reviewed: I have personally reviewed following labs and imaging studies  CBC: Recent Labs  Lab 06/16/18 2300 06/18/18 0647 06/19/18 1932 06/20/18 1006 06/22/18 0443  WBC 9.2 11.6* 11.7* 9.0 9.7  NEUTROABS 7.0  --   --   --   --   HGB 10.5* 9.5* 9.8* 9.1* 10.0*  HCT 33.1* 29.1* 29.8* 28.5* 31.2*  MCV 100.6* 99.0 94.9 96.3 96.3  PLT 189 174 259 236 390   Basic Metabolic Panel: Recent Labs  Lab 06/18/18 0647 06/19/18 0430 06/19/18 1932 06/20/18 1007 06/22/18 0443  NA 138 136 135 132* 134*  K 5.3* 4.5 4.2 4.4 4.6  CL 101 91* 92* 91* 97*  CO2 20* 23 25 21* 22  GLUCOSE 205* 293* 290* 407* 301*  BUN 44* 31* 44* 52* 38*  CREATININE 10.67* 7.64* 8.80* 10.79* 8.95*  CALCIUM 8.2* 8.4* 8.6* 8.6* 8.6*  PHOS 7.1*  --  5.5* 6.2*  --    GFR: Estimated Creatinine Clearance: 7.5 mL/min (A) (by C-G formula based on SCr of 8.95 mg/dL (H)). Liver Function Tests: Recent Labs  Lab 06/16/18 2302 06/18/18 0647 06/19/18 1932 06/20/18 1007  AST 25  --   --   --   ALT 13  --   --   --   ALKPHOS 76  --   --   --   BILITOT 0.6  --   --   --   PROT 8.9*  --   --   --   ALBUMIN 3.2* 2.3* 2.3* 2.3*   No results for input(s): LIPASE, AMYLASE in the last 168 hours. No results for input(s): AMMONIA in the last 168 hours. Coagulation Profile: No results for input(s): INR, PROTIME in the last 168 hours. Cardiac Enzymes: Recent Labs  Lab 06/17/18 0830 06/17/18 1450  TROPONINI <0.03 <0.03   BNP (last 3 results) No results for input(s): PROBNP in the last 8760 hours. HbA1C: No results for input(s): HGBA1C in the last 72 hours. CBG: Recent Labs  Lab 06/21/18 1119 06/21/18 1622 06/21/18 2152 06/22/18 0736  06/22/18 1121  GLUCAP 388* 328* 260* 341* 236*   Lipid Profile: No results for input(s): CHOL, HDL, LDLCALC, TRIG, CHOLHDL, LDLDIRECT in the last 72 hours. Thyroid Function Tests: No results for input(s): TSH, T4TOTAL, FREET4, T3FREE, THYROIDAB in the last 72 hours. Anemia Panel: No results for input(s): VITAMINB12, FOLATE, FERRITIN, TIBC, IRON, RETICCTPCT in the last 72 hours. Sepsis Labs: Recent Labs  Lab 06/17/18 0138 06/17/18 0500  LATICACIDVEN 2.9* 0.8    Recent Results (from the past 240 hour(s))  Blood Culture (routine x 2)     Status: Abnormal   Collection Time: 06/17/18  2:14 AM  Result Value Ref Range Status   Specimen Description BLOOD RIGHT ARM  Final   Special Requests   Final    BOTTLES DRAWN AEROBIC AND ANAEROBIC Blood Culture adequate volume Performed at [facility], [place] [address], [place], [place] [address]    Culture  Setup Time   Final  GRAM POSITIVE COCCI AEROBIC BOTTLE ONLY CRITICAL VALUE NOTED.  VALUE IS CONSISTENT WITH PREVIOUSLY REPORTED AND CALLED VALUE.    Culture (A)  Final    STAPHYLOCOCCUS AUREUS SUSCEPTIBILITIES PERFORMED ON PREVIOUS CULTURE WITHIN THE LAST 5 DAYS. Performed at [facility], [place] [address] [place], [place] [address]    Report Status 06/20/2018 FINAL  Final  Blood Culture (routine x 2)     Status: Abnormal   Collection Time: 06/17/18  2:14 AM  Result Value Ref Range Status   Specimen Description   Final    BLOOD RIGHT HAND Performed at [place] [address], [place], [place] [address]    Special Requests   Final    BOTTLES DRAWN AEROBIC AND ANAEROBIC Blood Culture adequate volume Performed at [place] [address], [place], [place] [address]    Culture  Setup Time   Final    IN BOTH AEROBIC AND ANAEROBIC BOTTLES GRAM POSITIVE COCCI CRITICAL RESULT CALLED TO, READ BACK BY AND VERIFIED WITH: C AMEND [facility] 06/18/18 0135 JDW Performed at [facility], [place] [address] [place], [place] [address]    Culture METHICILLIN RESISTANT STAPHYLOCOCCUS AUREUS (A)  Final   Report Status 06/20/2018 FINAL  Final   Organism ID, Bacteria METHICILLIN RESISTANT STAPHYLOCOCCUS AUREUS  Final      Susceptibility   Methicillin resistant staphylococcus aureus - MIC*    CIPROFLOXACIN <=0.5 SENSITIVE Sensitive     ERYTHROMYCIN >=8 RESISTANT Resistant     GENTAMICIN <=0.5 SENSITIVE Sensitive     OXACILLIN >=4 RESISTANT Resistant     TETRACYCLINE >=16 RESISTANT Resistant     VANCOMYCIN <=0.5 SENSITIVE Sensitive     TRIMETH/SULFA <=10 SENSITIVE Sensitive     CLINDAMYCIN <=0.25 SENSITIVE Sensitive     RIFAMPIN <=0.5 SENSITIVE Sensitive     Inducible Clindamycin NEGATIVE Sensitive     * METHICILLIN RESISTANT STAPHYLOCOCCUS AUREUS  Blood Culture ID Panel (Reflexed)     Status: Abnormal   Collection Time: 06/17/18  2:14 AM  Result Value Ref Range Status   Enterococcus species NOT DETECTED NOT DETECTED Final   Listeria monocytogenes NOT DETECTED NOT DETECTED Final   Staphylococcus species DETECTED (A) NOT DETECTED Final    Comment: CRITICAL RESULT CALLED TO, READ BACK BY AND VERIFIED WITH: C AMEND PHARMD 06/18/18 0133 JDW    Staphylococcus aureus (BCID) DETECTED (A) NOT DETECTED Final    Comment: Methicillin (oxacillin)-resistant Staphylococcus aureus (MRSA). MRSA is predictably resistant to beta-lactam antibiotics (except ceftaroline). Preferred therapy is vancomycin unless clinically contraindicated. Patient requires contact precautions if  hospitalized. CRITICAL RESULT CALLED TO, READ BACK BY AND VERIFIED WITH: C AMEND PHARMD 06/18/18 0133 JDW    Methicillin resistance DETECTED (A) NOT DETECTED Final    Comment: CRITICAL RESULT CALLED TO, READ BACK BY AND VERIFIED WITH: C AMEND PHARMD 06/18/18 0133 JDW    Streptococcus species NOT DETECTED NOT DETECTED Final   Streptococcus agalactiae NOT DETECTED NOT DETECTED Final   Streptococcus pneumoniae NOT  DETECTED NOT DETECTED Final   Streptococcus pyogenes NOT DETECTED NOT DETECTED Final   Acinetobacter baumannii NOT DETECTED NOT DETECTED Final   Enterobacteriaceae species NOT DETECTED NOT DETECTED Final   Enterobacter cloacae complex NOT DETECTED NOT DETECTED Final   Escherichia coli NOT DETECTED NOT DETECTED Final   Klebsiella oxytoca NOT DETECTED NOT DETECTED Final   Klebsiella pneumoniae NOT DETECTED NOT DETECTED Final   Proteus species NOT DETECTED NOT DETECTED Final   Serratia marcescens  NOT DETECTED NOT DETECTED Final   Haemophilus influenzae NOT DETECTED NOT DETECTED Final   Neisseria meningitidis NOT DETECTED NOT DETECTED Final   Pseudomonas aeruginosa NOT DETECTED NOT DETECTED Final   Candida albicans NOT DETECTED NOT DETECTED Final   Candida glabrata NOT DETECTED NOT DETECTED Final   Candida krusei NOT DETECTED NOT DETECTED Final   Candida parapsilosis NOT DETECTED NOT DETECTED Final   Candida tropicalis NOT DETECTED NOT DETECTED Final    Comment: Performed at [facility], [place] [address] [place], [place] [address]  Culture, blood (routine x 2)     Status: None (Preliminary result)   Collection Time: 06/19/18  3:50 PM  Result Value Ref Range Status   Specimen Description BLOOD RIGHT ARM  Final   Special Requests   Final    BOTTLES DRAWN AEROBIC AND ANAEROBIC Blood Culture adequate volume   Culture   Final    NO GROWTH 2 DAYS Performed at [facility], [address], [place], [place] [address]    Report Status PENDING  Incomplete  Culture, blood (routine x 2)     Status: None (Preliminary result)   Collection Time: 06/19/18  4:00 PM  Result Value Ref Range Status   Specimen Description BLOOD RIGHT HAND  Final   Special Requests   Final    BOTTLES DRAWN AEROBIC ONLY Blood Culture adequate volume   Culture   Final    NO GROWTH 2 DAYS Performed at [facility], [place] [address] [place],  [address]    Report Status PENDING  Incomplete          Radiology Studies: No results found.      Scheduled Meds: . bictegravir-emtricitabine-tenofovir AF  1 tablet Oral Daily  . Chlorhexidine Gluconate Cloth  6 each Topical Q0600  . doxercalciferol  3 mcg Intravenous Q T,Th,Sa-HD  . ferric citrate  420 mg Oral TID WC  . heparin  5,000 Units Subcutaneous Q8H  . insulin aspart  0-15 Units Subcutaneous TID WC  . insulin aspart  0-5 Units Subcutaneous QHS  . midodrine  5 mg Oral TID WC  . vancomycin  500 mg Intravenous Q T,Th,Sa-HD   Continuous Infusions: . sodium chloride 20 mL/hr at 06/22/18 0600     LOS: 5 days     Cordelia Poche, MD [facility] 06/22/2018, 12:48 PM  If 7PM-7AM, please contact night-coverage [email or web address]

## 2018-06-22 NOTE — Progress Notes (Signed)
PROGRESS NOTE    Alvin Daniels  QIW:[id number] DOB: [birth year] DOA: 06/16/2018 PCP: Alvin Rakes, MD   Brief Narrative: Alvin Daniels is a 48 y.o. male with a history of ESRD on HD, diabetes mellitus, chronic hepatitis C, HIV, s/p BKA. Patient presented secondary to fevers/chills/malaise and found to have sepsis and eventually MRSA bacteremia. He has been started on Vancomycin and ID is on board.   Assessment & Plan:   Principal Problem:   Bacteremia Active Problems:   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)   Hypotension   ESRD on dialysis ([name])   Sepsis ([name])   Chronic hepatitis C without hepatic coma ([name])   HIV (human immunodeficiency virus infection) ([name])   Diabetic foot ulcer ([name])   MRSA Bacteremia Source is likely right foot wound. Question of abscess on MRI. Infectious disease on board. No vegetations on Transthoracic Echocardiogram. Continues to have fevers. WBC normal. -ID recommendations: Vancomycin, Transesophageal Echocardiogram (2/10); further recommendations pending today -Repeat blood cultures (06/19/2018) no growth to date  Sepsis Septic shock Present on admission. Secondary to bacteremia. Physiology resolved.  ESRD on HD -Nephrology for HD  Diabetes mellitus Hyperglycemia. History of 70/30 use in the past. Patient's hemoglobin A1C is 9%, down from 13.4% 7 months prior -Continue SSI  Chronic hepatitis C HIV infection -Continue Biktarvy -ID outpatient follow-up  Diabetic foot ulcer MRI foot negative for osteomyelitis. Possible abscess. Not a pressure ulcer -Orthopedic surgery recommendations pending; will call again since patient is having fevers   DVT prophylaxis: Heparin subq Code Status:   Code Status: Full Code Family Communication: None at bedside Disposition Plan: Discharge pending bacteremia workup   Consultants:   Infectious disease  Cardiology  Procedures:   Transthoracic Echocardiogram  (06/18/2018) IMPRESSIONS    1. The left ventricle has normal systolic function of 41-74%. The cavity size was normal. There is no increased left ventricular wall thickness. Echo evidence of pseudonormalization in diastolic relaxation.  2. The right ventricle has normal systolic function. The cavity was normal. There is no increase in right ventricular wall thickness.  3. Left atrial size was mildly dilated.  4. The mitral valve is normal in structure. No evidence of mitral valve stenosis. Trivial mitral regurgitation.  5. The tricuspid valve is normal in structure.  6. The aortic valve is tricuspid. No aortic stenosis.  7. No evidence of left ventricular regional wall motion abnormalities.  8. The inferior vena cava is normal in size with greater than 50% respiratory variability  9. No complete TR doppler jet so unable to estimate PA systolic pressure.  Antimicrobials:  Vancomycin (2/4>>  Biktarvy   Subjective: Interpreter services used.  Febrile again overnight. No other issues.  Objective: Vitals:   06/22/18 0451 06/22/18 0739 06/22/18 1138 06/22/18 1200  BP: 107/66 106/60 (!) 85/51 (!) 90/59  Pulse: 88 87 83   Resp: 14 14 14    Temp: 98.8 F (37.1 C) 98.5 F (36.9 C) 98.3 F (36.8 C)   TempSrc: Oral Oral Oral   SpO2: 96% 98% 99%   Weight: 51.7 kg     Height:        Intake/Output Summary (Last 24 hours) at 06/22/2018 1248 Last data filed at 06/22/2018 0908 Gross per 24 hour  Intake 890.58 ml  Output -  Net 890.58 ml   Filed Weights   06/20/18 1355 06/21/18 0500 06/22/18 0451  Weight: 51.3 kg 49.2 kg 51.7 kg    Examination:  General exam: Appears calm and comfortable Respiratory system: Clear  to auscultation. Respiratory effort normal. Cardiovascular system: S1 & S2 heard, RRR. No murmurs, rubs, gallops or clicks. Gastrointestinal system: Abdomen is nondistended, soft and nontender. No organomegaly or masses felt. Normal bowel sounds heard. Central nervous  system: Alert and oriented. No focal neurological deficits. Extremities: Right foot edema with no tenderness and warmth that extends up his entire leg. Left BKA.  Skin: No cyanosis. No rashes Psychiatry: Judgement and insight appear normal. Mood & affect appropriate.    Data Reviewed: I have personally reviewed following labs and imaging studies  CBC: Recent Labs  Lab 06/16/18 2300 06/18/18 0647 06/19/18 1932 06/20/18 1006 06/22/18 0443  WBC 9.2 11.6* 11.7* 9.0 9.7  NEUTROABS 7.0  --   --   --   --   HGB 10.5* 9.5* 9.8* 9.1* 10.0*  HCT 33.1* 29.1* 29.8* 28.5* 31.2*  MCV 100.6* 99.0 94.9 96.3 96.3  PLT 189 174 259 236 885   Basic Metabolic Panel: Recent Labs  Lab 06/18/18 0647 06/19/18 0430 06/19/18 1932 06/20/18 1007 06/22/18 0443  NA 138 136 135 132* 134*  K 5.3* 4.5 4.2 4.4 4.6  CL 101 91* 92* 91* 97*  CO2 20* 23 25 21* 22  GLUCOSE 205* 293* 290* 407* 301*  BUN 44* 31* 44* 52* 38*  CREATININE 10.67* 7.64* 8.80* 10.79* 8.95*  CALCIUM 8.2* 8.4* 8.6* 8.6* 8.6*  PHOS 7.1*  --  5.5* 6.2*  --    GFR: Estimated Creatinine Clearance: 7.5 mL/min (A) (by C-G formula based on SCr of 8.95 mg/dL (H)). Liver Function Tests: Recent Labs  Lab 06/16/18 2302 06/18/18 0647 06/19/18 1932 06/20/18 1007  AST 25  --   --   --   ALT 13  --   --   --   ALKPHOS 76  --   --   --   BILITOT 0.6  --   --   --   PROT 8.9*  --   --   --   ALBUMIN 3.2* 2.3* 2.3* 2.3*   No results for input(s): LIPASE, AMYLASE in the last 168 hours. No results for input(s): AMMONIA in the last 168 hours. Coagulation Profile: No results for input(s): INR, PROTIME in the last 168 hours. Cardiac Enzymes: Recent Labs  Lab 06/17/18 0830 06/17/18 1450  TROPONINI <0.03 <0.03   BNP (last 3 results) No results for input(s): PROBNP in the last 8760 hours. HbA1C: No results for input(s): HGBA1C in the last 72 hours. CBG: Recent Labs  Lab 06/21/18 1119 06/21/18 1622 06/21/18 2152 06/22/18 0736  06/22/18 1121  GLUCAP 388* 328* 260* 341* 236*   Lipid Profile: No results for input(s): CHOL, HDL, LDLCALC, TRIG, CHOLHDL, LDLDIRECT in the last 72 hours. Thyroid Function Tests: No results for input(s): TSH, T4TOTAL, FREET4, T3FREE, THYROIDAB in the last 72 hours. Anemia Panel: No results for input(s): VITAMINB12, FOLATE, FERRITIN, TIBC, IRON, RETICCTPCT in the last 72 hours. Sepsis Labs: Recent Labs  Lab 06/17/18 0138 06/17/18 0500  LATICACIDVEN 2.9* 0.8    Recent Results (from the past 240 hour(s))  Blood Culture (routine x 2)     Status: Abnormal   Collection Time: 06/17/18  2:14 AM  Result Value Ref Range Status   Specimen Description BLOOD RIGHT ARM  Final   Special Requests   Final    BOTTLES DRAWN AEROBIC AND ANAEROBIC Blood Culture adequate volume Performed at [facility], [place] [address] [place], [place] [address]    Culture  Setup Time   Final  GRAM POSITIVE COCCI AEROBIC BOTTLE ONLY CRITICAL VALUE NOTED.  VALUE IS CONSISTENT WITH PREVIOUSLY REPORTED AND CALLED VALUE.    Culture (A)  Final    STAPHYLOCOCCUS AUREUS SUSCEPTIBILITIES PERFORMED ON PREVIOUS CULTURE WITHIN THE LAST 5 DAYS. Performed at [facility], [place] [address], [place], [place] [address]    Report Status 06/20/2018 FINAL  Final  Blood Culture (routine x 2)     Status: Abnormal   Collection Time: 06/17/18  2:14 AM  Result Value Ref Range Status   Specimen Description   Final    BLOOD RIGHT HAND Performed at [facility] [address], [place], [place] [address]    Special Requests   Final    BOTTLES DRAWN AEROBIC AND ANAEROBIC Blood Culture adequate volume Performed at [facility] [address], [place], [place] [address]    Culture  Setup Time   Final    IN BOTH AEROBIC AND ANAEROBIC BOTTLES GRAM POSITIVE COCCI CRITICAL RESULT CALLED TO, READ BACK BY AND VERIFIED WITH: C AMEND [facility] 06/18/18 0135 JDW Performed at [facility], [place] [address] [place], [place] [address]    Culture METHICILLIN RESISTANT STAPHYLOCOCCUS AUREUS (A)  Final   Report Status 06/20/2018 FINAL  Final   Organism ID, Bacteria METHICILLIN RESISTANT STAPHYLOCOCCUS AUREUS  Final      Susceptibility   Methicillin resistant staphylococcus aureus - MIC*    CIPROFLOXACIN <=0.5 SENSITIVE Sensitive     ERYTHROMYCIN >=8 RESISTANT Resistant     GENTAMICIN <=0.5 SENSITIVE Sensitive     OXACILLIN >=4 RESISTANT Resistant     TETRACYCLINE >=16 RESISTANT Resistant     VANCOMYCIN <=0.5 SENSITIVE Sensitive     TRIMETH/SULFA <=10 SENSITIVE Sensitive     CLINDAMYCIN <=0.25 SENSITIVE Sensitive     RIFAMPIN <=0.5 SENSITIVE Sensitive     Inducible Clindamycin NEGATIVE Sensitive     * METHICILLIN RESISTANT STAPHYLOCOCCUS AUREUS  Blood Culture ID Panel (Reflexed)     Status: Abnormal   Collection Time: 06/17/18  2:14 AM  Result Value Ref Range Status   Enterococcus species NOT DETECTED NOT DETECTED Final   Listeria monocytogenes NOT DETECTED NOT DETECTED Final   Staphylococcus species DETECTED (A) NOT DETECTED Final    Comment: CRITICAL RESULT CALLED TO, READ BACK BY AND VERIFIED WITH: C AMEND [facility]AR[facility] 06/18/18 0133 JDW    Staphylococcus aureus (BCID) DETECTED (A) NOT DETECTED Final    Comment: Methicillin (oxacillin)-resistant Staphylococcus aureus (MRSA). MRSA is predictably resistant to beta-lactam antibiotics (except ceftaroline). Preferred therapy is vancomycin unless clinically contraindicated. Patient requires contact precautions if  hospitalized. CRITICAL RESULT CALLED TO, READ BACK BY AND VERIFIED WITH: C AMEND PHARMD 06/18/18 0133 JDW    Methicillin resistance DETECTED (A) NOT DETECTED Final    Comment: CRITICAL RESULT CALLED TO, READ BACK BY AND VERIFIED WITH: C AMEND PHARMD 06/18/18 0133 JDW    Streptococcus species NOT DETECTED NOT DETECTED Final   Streptococcus agalactiae NOT DETECTED NOT DETECTED Final   Streptococcus pneumoniae NOT  DETECTED NOT DETECTED Final   Streptococcus pyogenes NOT DETECTED NOT DETECTED Final   Acinetobacter baumannii NOT DETECTED NOT DETECTED Final   Enterobacteriaceae species NOT DETECTED NOT DETECTED Final   Enterobacter cloacae complex NOT DETECTED NOT DETECTED Final   Escherichia coli NOT DETECTED NOT DETECTED Final   Klebsiella oxytoca NOT DETECTED NOT DETECTED Final   Klebsiella pneumoniae NOT DETECTED NOT DETECTED Final   Proteus species NOT DETECTED NOT DETECTED Final   Serratia marcescens  NOT DETECTED NOT DETECTED Final   Haemophilus influenzae NOT DETECTED NOT DETECTED Final   Neisseria meningitidis NOT DETECTED NOT DETECTED Final   Pseudomonas aeruginosa NOT DETECTED NOT DETECTED Final   Candida albicans NOT DETECTED NOT DETECTED Final   Candida glabrata NOT DETECTED NOT DETECTED Final   Candida krusei NOT DETECTED NOT DETECTED Final   Candida parapsilosis NOT DETECTED NOT DETECTED Final   Candida tropicalis NOT DETECTED NOT DETECTED Final    Comment: Performed at [facility], [place] [address], [place], [place] [address]  Culture, blood (routine x 2)     Status: None (Preliminary result)   Collection Time: 06/19/18  3:50 PM  Result Value Ref Range Status   Specimen Description BLOOD RIGHT ARM  Final   Special Requests   Final    BOTTLES DRAWN AEROBIC AND ANAEROBIC Blood Culture adequate volume   Culture   Final    NO GROWTH 2 DAYS Performed at [facility], [address], [place], [place] [address]    Report Status PENDING  Incomplete  Culture, blood (routine x 2)     Status: None (Preliminary result)   Collection Time: 06/19/18  4:00 PM  Result Value Ref Range Status   Specimen Description BLOOD RIGHT HAND  Final   Special Requests   Final    BOTTLES DRAWN AEROBIC ONLY Blood Culture adequate volume   Culture   Final    NO GROWTH 2 DAYS Performed at [facility], [place] [address], [place], [place] [address]    Report Status PENDING  Incomplete          Radiology Studies: No results found.      Scheduled Meds: . bictegravir-emtricitabine-tenofovir AF  1 tablet Oral Daily  . Chlorhexidine Gluconate Cloth  6 each Topical Q0600  . doxercalciferol  3 mcg Intravenous Q T,Th,Sa-HD  . ferric citrate  420 mg Oral TID WC  . heparin  5,000 Units Subcutaneous Q8H  . insulin aspart  0-15 Units Subcutaneous TID WC  . insulin aspart  0-5 Units Subcutaneous QHS  . midodrine  5 mg Oral TID WC  . vancomycin  500 mg Intravenous Q T,Th,Sa-HD   Continuous Infusions: . sodium chloride 20 mL/hr at 06/22/18 0600     LOS: 5 days     Cordelia Poche, MD [facility] 06/22/2018, 12:48 PM  If 7PM-7AM, please contact night-coverage [email or web address]

## 2018-06-22 NOTE — Anesthesia Preprocedure Evaluation (Addendum)
Anesthesia Evaluation  Patient identified by MRN, date of birth, ID band Patient awake    Reviewed: Allergy & Precautions, NPO status , Patient's Chart, lab work & pertinent test results  History of Anesthesia Complications Negative for: history of anesthetic complications  Airway Mallampati: II  TM Distance: >3 FB Neck ROM: Full    Dental  (+) Dental Advisory Given, Poor Dentition,    Pulmonary neg pulmonary ROS,    Pulmonary exam normal breath sounds clear to auscultation       Cardiovascular negative cardio ROS Normal cardiovascular exam Rhythm:Regular Rate:Normal     Neuro/Psych negative neurological ROS     GI/Hepatic GERD  Medicated and Controlled,(+) Hepatitis -, C  Endo/Other  diabetes, Poorly Controlled, Type 2, Insulin Dependent  Renal/GU Dialysis and ESRFRenal disease (HD T/Th/Sa)     Musculoskeletal negative musculoskeletal ROS (+)   Abdominal   Peds  Hematology  (+) anemia , HIV,   Anesthesia Other Findings Day of surgery medications reviewed with the patient.  Reproductive/Obstetrics                            Anesthesia Physical Anesthesia Plan  ASA: IV  Anesthesia Plan: MAC   Post-op Pain Management:    Induction:   PONV Risk Score and Plan: Treatment may vary due to age or medical condition and Propofol infusion  Airway Management Planned: Natural Airway and Nasal Cannula  Additional Equipment:   Intra-op Plan:   Post-operative Plan:   Informed Consent: I have reviewed the patients History and Physical, chart, labs and discussed the procedure including the risks, benefits and alternatives for the proposed anesthesia with the patient or authorized representative who has indicated his/her understanding and acceptance.     Dental advisory given  Plan Discussed with: CRNA  Anesthesia Plan Comments:        Anesthesia Quick Evaluation

## 2018-06-23 ENCOUNTER — Encounter (HOSPITAL_COMMUNITY): Payer: Self-pay | Admitting: Cardiology

## 2018-06-23 DIAGNOSIS — L97411 Non-pressure chronic ulcer of right heel and midfoot limited to breakdown of skin: Secondary | ICD-10-CM

## 2018-06-23 DIAGNOSIS — Z95828 Presence of other vascular implants and grafts: Secondary | ICD-10-CM

## 2018-06-23 LAB — RENAL FUNCTION PANEL
Albumin: 2.1 g/dL — ABNORMAL LOW (ref 3.5–5.0)
Anion gap: 16 — ABNORMAL HIGH (ref 5–15)
BUN: 53 mg/dL — AB (ref 6–20)
CO2: 20 mmol/L — ABNORMAL LOW (ref 22–32)
Calcium: 8.9 mg/dL (ref 8.9–10.3)
Chloride: 95 mmol/L — ABNORMAL LOW (ref 98–111)
Creatinine, Ser: 11.36 mg/dL — ABNORMAL HIGH (ref 0.61–1.24)
GFR calc Af Amer: 5 mL/min — ABNORMAL LOW (ref >60)
GFR calc non Af Amer: 5 mL/min — ABNORMAL LOW (ref >60)
Glucose, Bld: 377 mg/dL — ABNORMAL HIGH (ref 70–99)
Phosphorus: 5.7 mg/dL — ABNORMAL HIGH (ref 2.5–4.6)
Potassium: 5.3 mmol/L — ABNORMAL HIGH (ref 3.5–5.1)
Sodium: 131 mmol/L — ABNORMAL LOW (ref 135–145)

## 2018-06-23 LAB — CBC
HCT: 27.9 % — ABNORMAL LOW (ref 39.0–52.0)
Hemoglobin: 8.9 g/dL — ABNORMAL LOW (ref 13.0–17.0)
MCH: 31 pg (ref 26.0–34.0)
MCHC: 31.9 g/dL (ref 30.0–36.0)
MCV: 97.2 fL (ref 80.0–100.0)
Platelets: 307 10*3/uL (ref 150–400)
RBC: 2.87 MIL/uL — ABNORMAL LOW (ref 4.22–5.81)
RDW: 13.8 % (ref 11.5–15.5)
WBC: 8.4 10*3/uL (ref 4.0–10.5)
nRBC: 0 % (ref 0.0–0.2)

## 2018-06-23 LAB — GLUCOSE, CAPILLARY: Glucose-Capillary: 196 mg/dL — ABNORMAL HIGH (ref 70–99)

## 2018-06-23 MED ORDER — MIDODRINE HCL 5 MG PO TABS
ORAL_TABLET | ORAL | Status: AC
  Administered 2018-06-23: 5 mg via ORAL
  Filled 2018-06-23: qty 1

## 2018-06-23 MED ORDER — DOXERCALCIFEROL 4 MCG/2ML IV SOLN
INTRAVENOUS | Status: AC
  Administered 2018-06-23: 3 ug via INTRAVENOUS
  Filled 2018-06-23: qty 2

## 2018-06-23 MED ORDER — VANCOMYCIN HCL IN DEXTROSE 500-5 MG/100ML-% IV SOLN: 500.0000 mg | INTRAVENOUS | Status: AC

## 2018-06-23 MED ORDER — MIDODRINE HCL 5 MG PO TABS: 5.0000 mg | ORAL_TABLET | Freq: Three times a day (TID) | ORAL | 0 refills | Status: AC

## 2018-06-23 MED ORDER — HEPARIN SODIUM (PORCINE) 1000 UNIT/ML DIALYSIS: 2000.0000 [IU] | Freq: Once | INTRAMUSCULAR | Status: DC

## 2018-06-23 MED ORDER — VANCOMYCIN HCL IN DEXTROSE 500-5 MG/100ML-% IV SOLN
INTRAVENOUS | Status: AC
  Administered 2018-06-23: 500 mg via INTRAVENOUS
  Filled 2018-06-23: qty 100

## 2018-06-23 MED ORDER — ACETAMINOPHEN 325 MG PO TABS: 650.0000 mg | ORAL_TABLET | Freq: Four times a day (QID) | ORAL | Status: DC | PRN

## 2018-06-23 NOTE — Progress Notes (Signed)
Patient's blood sugar elevated at 436, [facility] on call text paged and orders received for 10 Units Regular Insulin to be given, instructed patient to watch his apple juice intake D/T amount of sugar in the juice, will continue to educate and follow, may need to get the Diabetic Coordinator on board for diet instructions.

## 2018-06-23 NOTE — Progress Notes (Addendum)
Subjective:  On hd  , no cos tolerating   Objective Vital signs in last 24 hours: Vitals:   06/23/18 1138 06/23/18 1143 06/23/18 1200 06/23/18 1220  BP: 125/72 (!) 141/77 121/65 138/75  Pulse: 97 88 85 92  Resp:    17  Temp:    98 F (36.7 C)  TempSrc:    Oral  SpO2:    98%  Weight:      Height:       Weight change: 0 kg  Physical Exam: General: on HD  NAD  Eating sandwich  Heart: RRR, No m,r,g Lungs: CTA  Abdomen: BS pos, Soft, NT, ND Extremities: No pedal edema Right / R foot dressing  Dialysis Access: AVF  Patent on hD      NW TTS  4h  2/2 bath  400/800  53kg  AVF  Hep 2000 Mircera 50 mcg last dose 1/30 Hectorol 3 mcg every rx Last PTH 141, phos 6.8 on 06/04/2018  Summary: Pt is a 48 y.o. yo male with DM, hep C, HIV, left BKA, chronic hypertension, ESRD TTS at [place] via AV fistula admitted with MRSA bacteremia.   Problem/Plan: # MRSA bacteremia: unclear source. Yesterday  TEE no veg , Will need 6 weeks of IV vanc postdialysis per ID.with [facility] for Vancomycin trough 15-20  Dr Sharol Given eval R heel  =no osteom   And Iv antibiotics  No surgery for now   # ESRD on HD TTS: on schedule . AVF w/o signs of infection on exam.   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 Monitor CBC.  No iron because of infection. hgb 8.9 /aranesp  60 subcut today  Then q tues hd   # Secondary hyperparathyroidism: Continue Hectorol during treatment.  Phosphorus elevated. Resume auryxia.   # Hypotension: Patient has chronic hypotension. On miodrine. BP better today.  # HIV per ID   Ernest Haber, PA-C [facility] [phone] 06/23/2018,12:50 PM  LOS: 6 days   Pt seen, examined and agree w A/P as above. For dc today, we will arrange for OP IV abx.  [facility] 06/23/2018, 1:27 PM    Labs: Basic Metabolic Panel: Recent Labs  Lab 06/19/18 1932 06/20/18 1007 06/22/18 0443 06/23/18 0739  NA 135 132* 134*  131*  K 4.2 4.4 4.6 5.3*  CL 92* 91* 97* 95*  CO2 25 21* 22 20*  GLUCOSE 290* 407* 301* 377*  BUN 44* 52* 38* 53*  CREATININE 8.80* 10.79* 8.95* 11.36*  CALCIUM 8.6* 8.6* 8.6* 8.9  PHOS 5.5* 6.2*  --  5.7*   Liver Function Tests: Recent Labs  Lab 06/16/18 2302  06/19/18 1932 06/20/18 1007 06/23/18 0739  AST 25  --   --   --   --   ALT 13  --   --   --   --   ALKPHOS 76  --   --   --   --   BILITOT 0.6  --   --   --   --   PROT 8.9*  --   --   --   --   ALBUMIN 3.2*   < > 2.3* 2.3* 2.1*   < > = values in this interval not displayed.   No results for input(s): LIPASE, AMYLASE in the last 168 hours. No results for input(s): AMMONIA in the last 168 hours. CBC: Recent Labs  Lab 06/16/18 2300 06/18/18 0647 06/19/18 1932 06/20/18 1006 06/22/18  4388 06/23/18 0739  WBC 9.2 11.6* 11.7* 9.0 9.7 8.4  NEUTROABS 7.0  --   --   --   --   --   HGB 10.5* 9.5* 9.8* 9.1* 10.0* 8.9*  HCT 33.1* 29.1* 29.8* 28.5* 31.2* 27.9*  MCV 100.6* 99.0 94.9 96.3 96.3 97.2  PLT 189 174 259 236 285 307   Cardiac Enzymes: Recent Labs  Lab 06/17/18 0830 06/17/18 1450  TROPONINI <0.03 <0.03   CBG: Recent Labs  Lab 06/21/18 2152 06/22/18 0736 06/22/18 1121 06/22/18 1642 06/22/18 2124  GLUCAP 260* 341* 236* 187* 436*    Studies/Results: No results found. Medications:  . bictegravir-emtricitabine-tenofovir AF  1 tablet Oral Daily  . Chlorhexidine Gluconate Cloth  6 each Topical Q0600  . Chlorhexidine Gluconate Cloth  6 each Topical Q0600  . doxercalciferol  3 mcg Intravenous Q T,Th,Sa-HD  . ferric citrate  420 mg Oral TID WC  . [START ON 06/24/2018] heparin  2,000 Units Dialysis Once in dialysis  . heparin  5,000 Units Subcutaneous Q8H  . insulin aspart  0-15 Units Subcutaneous TID WC  . insulin aspart  0-5 Units Subcutaneous QHS  . midodrine  5 mg Oral TID WC  . vancomycin  500 mg Intravenous Q T,Th,Sa-HD

## 2018-06-23 NOTE — Discharge Instructions (Signed)
[place],  You were in the hospital because of an infection in your blood. You are on antibiotics and will get them with hemodialysis. Please follow-up with the infectious disease clinic. Please follow-up with Dr. Sharol Given for your foot.  Estuvo en el hospital debido a una infeccin en la [place]. Est tomando antibiticos y los obtendr con hemodilisis. Haga un seguimiento con la clnica de enfermedades infecciosas. Por favor, haga un seguimiento con el Dr. Sharol Given para su pie.

## 2018-06-23 NOTE — Discharge Summary (Signed)
Physician Discharge Summary  Alvin Daniels UUV:[id number] DOB: 28-Sep-1970 DOA: 06/16/2018  PCP: [name], MD  Admit date: 06/16/2018 Discharge date: 06/23/2018  Admitted From: Home Disposition: Home  Recommendations for Outpatient Follow-up:  1. Follow up with PCP in 1 week 2. Follow up with Dr. Sharol Given in 1 week 3. Follow-up with infectious disease clinic in 3 weeks 4. Please obtain BMP/CBC in one week 5. Vancomycin IV with hemodialysis; end date: 07/30/2018 6. Please follow up on the following pending results: Blood culture (06/19/2018)  Home Health: None Equipment/Devices: None  Discharge Condition: Stable CODE STATUS: Full code Diet recommendation: Renal diet   Brief/Interim Summary:  Admission HPI written by Caren Griffins, MD   Chief Complaint: fevers/chills/not feeling good  HPI: Alvin Daniels is a 48 y.o. male with medical history significant of HIV, chronic hypotension, end-stage renal disease, left BKA, who presents to the hospital with chief complaints of not feeling good over the last 2 to 3 days.  Patient reports generalized weakness, fever and chills, as well as a productive cough.  He states that he is bringing dark sputum.  He also has been having a slight sore throat.  He underwent dialysis yesterday, and was able to finish but continued to get worse, had generalized muscle aches with bilateral hip pain, was also complaining of right-sided chest pain along with a cough.  He decided to come to the emergency room last night.  He denies any runny nose, he denies any lightheadedness or dizziness.  He denies any shortness of breath.  He has no abdominal pain, no nausea vomiting or diarrhea.  ED Course: In the emergency room patient was febrile and hypotensive, given HIV status he was treated as sepsis, he received IV fluids, received vancomycin and cefepime and he is feeling a lot better on my evaluation.  Blood cultures were obtained.  Initial  lactic acid was 2.9 and normalized with fluids.  Influenza was negative.  Chest x-ray without infiltrates.  EKG nonischemic.  We are asked to admit    Hospital course:  MRSA Bacteremia Source is likely right foot wound. Question of abscess on MRI. Infectious disease on board. No vegetations on Transthoracic Echocardiogram or Transesophageal Echocardiogram. Patient had some isolated fevers over the weekend prior to discharge that resolved. WBC normal. Vancomycin for 6 weeks with Hemodialysis. End date of March 19th 2020  Sepsis Septic shock Present on admission. Secondary to bacteremia. Physiology resolved.   ESRD on HD Nephrology for HD  Diabetes mellitus Hyperglycemia. History of 70/30 use in the past. Patient's hemoglobin A1C is 9%, down from 13.4% 7 months prior. Currently not on treatment. Recommend outpatient follow-up.  Chronic hepatitis C HIV infection Continue Biktarvy. Outpatient follow-up.  Diabetic foot ulcer MRI foot negative for osteomyelitis. Possible abscess. Not a pressure ulcer. Orthopedic surgery evaluated and felt no surgical intervention warranted. Will follow-up as an outpatient.  Discharge Diagnoses:  Principal Problem:   Bacteremia Active Problems:   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)   Hypotension   ESRD on dialysis ([place])   Sepsis ([place])   Chronic hepatitis C without hepatic coma ([name])   HIV (human immunodeficiency virus infection) ([place])   Diabetic foot ulcer ([place])   MRSA bacteremia    Discharge Instructions  Discharge Instructions    Call MD for:  severe uncontrolled pain   Complete by:  As directed    Call MD for:  temperature >100.4   Complete by:  As directed  Allergies as of 06/23/2018   No Known Allergies     Medication List    TAKE these medications   acetaminophen 325 MG tablet Commonly known as:  TYLENOL Take 2 tablets (650 mg total) by mouth every 6 (six) hours as needed for mild pain.     bictegravir-emtricitabine-tenofovir AF 50-200-25 MG Tabs tablet Commonly known as:  BIKTARVY Take 1 tablet by mouth daily.   midodrine 5 MG tablet Commonly known as:  PROAMATINE Take 1 tablet (5 mg total) by mouth 3 (three) times daily with meals for 30 days.   vancomycin 500-5 MG/100ML-% IVPB Commonly known as:  VANCOCIN Inject 100 mLs (500 mg total) into the vein Every Tuesday,Thursday,and Saturday with dialysis. End date: March 19th 2020. Check CBC w/ differential, ESR, CRP weekly Start taking on:  June 25, 2018      Follow-up Information    Newt Minion, MD Follow up in 1 week(s).   Specialty:  Orthopedic Surgery Contact information: [place] [address] [phone]        [name], MD. Schedule an appointment as soon as possible for a visit in 1 week(s).   Specialty:  Family Medicine Contact information: [place] [address] [phone]        Tommy Medal, Lavell Islam, MD. Go on 07/16/2018.   Specialty:  Infectious Diseases Why:  10:45AM Contact information: [address] [address] [phone]          No Known Allergies  Consultations:  Infectious disease  Cardiology  Orthopedic surgery   Procedures/Studies: Dg Chest 2 View  Result Date: 06/16/2018 CLINICAL DATA:  Mid chest pain and weakness in a dialysis patient. EXAM: CHEST - 2 VIEW COMPARISON:  08/15/2016 FINDINGS: Shallow inspiration. Normal heart size and pulmonary vascularity. No focal airspace disease or consolidation in the lungs. No blunting of costophrenic angles. No pneumothorax. Mediastinal contours appear intact. Interval removal of right central venous catheter since previous study. Healing fracture of the midshaft left clavicle, new since prior study. IMPRESSION: No evidence of active pulmonary disease. Healing fracture of the midshaft left clavicle. Electronically Signed   By: Lucienne Capers M.D.   On:  06/16/2018 23:34   Mr Foot Right Wo Contrast  Result Date: 06/19/2018 CLINICAL DATA:  Pressure ulcer on the heel of the right foot. MRSA bacteremia. EXAM: MRI OF THE RIGHT FOOT WITHOUT CONTRAST TECHNIQUE: Multiplanar, multisequence MR imaging of the right foot was performed. No intravenous contrast was administered. COMPARISON:  Radiographs dated 06/18/2018 and MRI dated 03/05/2016 FINDINGS: Bones/Joint/Cartilage There is a chronic comminuted fracture of the calcaneus with further distraction of the fragments since the prior study. There are new slight arthritic changes of distal talus. The other bones of the foot demonstrate no significant abnormalities. There is a small ankle effusion and a subtalar joint effusion which extends into the soft tissues of the plantar aspect of the heel through the distracted chronic fracture of the calcaneus. Ligaments Intact. Muscles and Tendons There is fluid in the tendon sheaths of the peroneal tendons extending from proximal to the lateral malleolus to the level of the base of the fifth metatarsal. There is also a fluid collection deep to the abductor digiti minimi tendon along the fifth metatarsal. There is fluid extending from the subtalar joint through the chronic fracture of the calcaneus into the plantar soft tissues along the flexor tendons in the midfoot. Soft tissues There is a superficial soft tissue ulceration on the  medial aspect of the heel without deep extension. This does not appear to communicate with the underlying fluid collection from the subtalar joint. IMPRESSION: 1. No findings suggestive of osteomyelitis of the foot or ankle. 2. 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 3. Multiple fluid collections in the foot some of which originate from the subtalar joint and some of which represent tenosynovitis of the peroneal tendons and some of which may represent pseudobursitis deep to the abductor digiti minimi tendon. I doubt that these represent  abscesses. However, given the bacteremia, there is potential for secondary infection of these fluid collections. 4. Superficial soft tissue ulceration of the heel with no underlying abscess or osteomyelitis. Electronically Signed   By: Lorriane Shire M.D.   On: 06/19/2018 13:42   Dg Foot 2 Views Right  Result Date: 06/18/2018 CLINICAL DATA:  Max temp 103, tachypnea, tachycardia, lactic acidosis and hypotension. 1/2 blood culture +MRSA. Has pressure ulcer on right foot as well. EXAM: RIGHT FOOT - 2 VIEW COMPARISON:  MRI, 03/05/2016 FINDINGS: No convincing acute fracture. There are fractures of the calcaneus with associated sclerosis, which were present on the prior MRI. The calcaneal tuberosity is truncated, but smoothly marginated. No areas of bone resorption is seen to suggest acute osteomyelitis. No acute dislocation. There is soft tissue swelling that is most evident over the lateral aspect of the mid to hindfoot. IMPRESSION: 1. Fragmented calcaneus similar to the prior MRI consistent with chronic ununited fractures. 2. There is no bone resorption to suggest osteomyelitis. No acute fractures. 3. Soft tissue swelling which predominates along the mid to hindfoot greater laterally. Electronically Signed   By: Lajean Manes M.D.   On: 06/18/2018 14:22     Transthoracic Echocardiogram (06/18/2018) IMPRESSIONS   1. The left ventricle has normal systolic function of 58-09%. The cavity size was normal. There is no increased left ventricular wall thickness. Echo evidence of pseudonormalization in diastolic relaxation. 2. The right ventricle has normal systolic function. The cavity was normal. There is no increase in right ventricular wall thickness. 3. Left atrial size was mildly dilated. 4. The mitral valve is normal in structure. No evidence of mitral valve stenosis. Trivial mitral regurgitation. 5. The tricuspid valve is normal in structure. 6. The aortic valve is tricuspid. No aortic  stenosis. 7. No evidence of left ventricular regional wall motion abnormalities. 8. The inferior vena cava is normal in size with greater than 50% respiratory variability 9. No complete TR doppler jet so unable to estimate PA systolic pressure.   Transesophageal Echocardiogram (06/22/2018) IMPRESSIONS    1. The left ventricle has normal systolic function of 98-33%. The cavity size was normal. There is no increased left ventricular wall thickness.  2. The right ventricle has normal systolic function. The cavity was normal . There is no increase in right ventricular wall thickness.  3. The tricuspid valve was normal in structure.  4. The pulmonic valve was normal in structure.  5. No Vegetations, no endocarditis.   Subjective: No issues overnight. Afebrile overnight  Discharge Exam: Vitals:   06/23/18 1220 06/23/18 1302  BP: 138/75 93/62  Pulse: 92 96  Resp: 17 16  Temp: 98 F (36.7 C) 98 F (36.7 C)  SpO2: 98% 98%   Vitals:   06/23/18 1143 06/23/18 1200 06/23/18 1220 06/23/18 1302  BP: (!) 141/77 121/65 138/75 93/62  Pulse: 88 85 92 96  Resp:   17 16  Temp:   98 F (36.7 C) 98 F (36.7 C)  TempSrc:  Oral Oral  SpO2:   98% 98%  Weight:   (P) 51.8 kg   Height:        General: Pt is alert, awake, not in acute distress Cardiovascular: RRR, S1/S2 +, no rubs, no gallops Respiratory: CTA bilaterally, no wheezing, no rhonchi Abdominal: Soft, NT, ND, bowel sounds + Extremities: charcot foot changes of right foot. No tenderness or erythema. Left BKA, no cyanosis    The results of significant diagnostics from this hospitalization (including imaging, microbiology, ancillary and laboratory) are listed below for reference.     Microbiology: Recent Results (from the past 240 hour(s))  Blood Culture (routine x 2)     Status: Abnormal   Collection Time: 06/17/18  2:14 AM  Result Value Ref Range Status   Specimen Description BLOOD RIGHT ARM  Final   Special Requests    Final    BOTTLES DRAWN AEROBIC AND ANAEROBIC Blood Culture adequate volume Performed at [facility] [address] [place], [place] [address]    Culture  Setup Time   Final    GRAM POSITIVE COCCI AEROBIC BOTTLE ONLY CRITICAL VALUE NOTED.  VALUE IS CONSISTENT WITH PREVIOUSLY REPORTED AND CALLED VALUE.    Culture (A)  Final    STAPHYLOCOCCUS AUREUS SUSCEPTIBILITIES PERFORMED ON PREVIOUS CULTURE WITHIN THE LAST 5 DAYS. Performed at [facility], [place] [address], [place], [place] [address]    Report Status 06/20/2018 FINAL  Final  Blood Culture (routine x 2)     Status: Abnormal   Collection Time: 06/17/18  2:14 AM  Result Value Ref Range Status   Specimen Description   Final    BLOOD RIGHT HAND Performed at [facility] [address] [place], [place] [address]    Special Requests   Final    BOTTLES DRAWN AEROBIC AND ANAEROBIC Blood Culture adequate volume Performed at [facility] [address] [place], [place] [address]    Culture  Setup Time   Final    IN BOTH AEROBIC AND ANAEROBIC BOTTLES GRAM POSITIVE COCCI CRITICAL RESULT CALLED TO, READ BACK BY AND VERIFIED WITH: C AMEND [facility] 06/18/18 0135 JDW Performed at [facility], [place] [address] [place], [place] [address]    Culture METHICILLIN RESISTANT STAPHYLOCOCCUS AUREUS (A)  Final   Report Status 06/20/2018 FINAL  Final   Organism ID, Bacteria METHICILLIN RESISTANT STAPHYLOCOCCUS AUREUS  Final      Susceptibility   Methicillin resistant staphylococcus aureus - MIC*    CIPROFLOXACIN <=0.5 SENSITIVE Sensitive     ERYTHROMYCIN >=8 RESISTANT Resistant     GENTAMICIN <=0.5 SENSITIVE Sensitive     OXACILLIN >=4 RESISTANT Resistant     TETRACYCLINE >=16 RESISTANT Resistant     VANCOMYCIN <=0.5 SENSITIVE Sensitive     TRIMETH/SULFA <=10 SENSITIVE Sensitive     CLINDAMYCIN <=0.25 SENSITIVE Sensitive     RIFAMPIN <=0.5 SENSITIVE Sensitive     Inducible  Clindamycin NEGATIVE Sensitive     * METHICILLIN RESISTANT STAPHYLOCOCCUS AUREUS  Blood Culture ID Panel (Reflexed)     Status: Abnormal   Collection Time: 06/17/18  2:14 AM  Result Value Ref Range Status   Enterococcus species NOT DETECTED NOT DETECTED Final   Listeria monocytogenes NOT DETECTED NOT DETECTED Final   Staphylococcus species DETECTED (A) NOT DETECTED Final    Comment: CRITICAL RESULT CALLED TO, READ BACK BY AND VERIFIED WITH: C AMEND PHARMD 06/18/18 0133 JDW    Staphylococcus aureus (BCID) DETECTED (A) NOT  DETECTED Final    Comment: Methicillin (oxacillin)-resistant Staphylococcus aureus (MRSA). MRSA is predictably resistant to beta-lactam antibiotics (except ceftaroline). Preferred therapy is vancomycin unless clinically contraindicated. Patient requires contact precautions if  hospitalized. CRITICAL RESULT CALLED TO, READ BACK BY AND VERIFIED WITH: C AMEND PHARMD 06/18/18 0133 JDW    Methicillin resistance DETECTED (A) NOT DETECTED Final    Comment: CRITICAL RESULT CALLED TO, READ BACK BY AND VERIFIED WITH: C AMEND PHARMD 06/18/18 0133 JDW    Streptococcus species NOT DETECTED NOT DETECTED Final   Streptococcus agalactiae NOT DETECTED NOT DETECTED Final   Streptococcus pneumoniae NOT DETECTED NOT DETECTED Final   Streptococcus pyogenes NOT DETECTED NOT DETECTED Final   Acinetobacter baumannii NOT DETECTED NOT DETECTED Final   Enterobacteriaceae species NOT DETECTED NOT DETECTED Final   Enterobacter cloacae complex NOT DETECTED NOT DETECTED Final   Escherichia coli NOT DETECTED NOT DETECTED Final   Klebsiella oxytoca NOT DETECTED NOT DETECTED Final   Klebsiella pneumoniae NOT DETECTED NOT DETECTED Final   Proteus species NOT DETECTED NOT DETECTED Final   Serratia marcescens NOT DETECTED NOT DETECTED Final   Haemophilus influenzae NOT DETECTED NOT DETECTED Final   Neisseria meningitidis NOT DETECTED NOT DETECTED Final   Pseudomonas aeruginosa NOT DETECTED NOT DETECTED  Final   Candida albicans NOT DETECTED NOT DETECTED Final   Candida glabrata NOT DETECTED NOT DETECTED Final   Candida krusei NOT DETECTED NOT DETECTED Final   Candida parapsilosis NOT DETECTED NOT DETECTED Final   Candida tropicalis NOT DETECTED NOT DETECTED Final    Comment: Performed at [facility], [place]. [address], [place], [place] [address]  Culture, blood (routine x 2)     Status: None (Preliminary result)   Collection Time: 06/19/18  3:50 PM  Result Value Ref Range Status   Specimen Description BLOOD RIGHT ARM  Final   Special Requests   Final    BOTTLES DRAWN AEROBIC AND ANAEROBIC Blood Culture adequate volume   Culture   Final    NO GROWTH 4 DAYS Performed at [facility], [address] [place], [place] [address]    Report Status PENDING  Incomplete  Culture, blood (routine x 2)     Status: None (Preliminary result)   Collection Time: 06/19/18  4:00 PM  Result Value Ref Range Status   Specimen Description BLOOD RIGHT HAND  Final   Special Requests   Final    BOTTLES DRAWN AEROBIC ONLY Blood Culture adequate volume   Culture   Final    NO GROWTH 4 DAYS Performed at [facility], [place] [address] [place], [place] [address]    Report Status PENDING  Incomplete     Labs: BNP (last 3 results) No results for input(s): BNP in the last 8760 hours. Basic Metabolic Panel: Recent Labs  Lab 06/18/18 0647 06/19/18 0430 06/19/18 1932 06/20/18 1007 06/22/18 0443 06/23/18 0739  NA 138 136 135 132* 134* 131*  K 5.3* 4.5 4.2 4.4 4.6 5.3*  CL 101 91* 92* 91* 97* 95*  CO2 20* 23 25 21* 22 20*  GLUCOSE 205* 293* 290* 407* 301* 377*  BUN 44* 31* 44* 52* 38* 53*  CREATININE 10.67* 7.64* 8.80* 10.79* 8.95* 11.36*  CALCIUM 8.2* 8.4* 8.6* 8.6* 8.6* 8.9  PHOS 7.1*  --  5.5* 6.2*  --  5.7*   Liver Function Tests: Recent Labs  Lab 06/16/18 2302 06/18/18 0647 06/19/18 1932 06/20/18 1007 06/23/18 0739  AST 25  --   --   --   --  ALT 13  --   --   --   --    ALKPHOS 76  --   --   --   --   BILITOT 0.6  --   --   --   --   PROT 8.9*  --   --   --   --   ALBUMIN 3.2* 2.3* 2.3* 2.3* 2.1*   No results for input(s): LIPASE, AMYLASE in the last 168 hours. No results for input(s): AMMONIA in the last 168 hours. CBC: Recent Labs  Lab 06/16/18 2300 06/18/18 0647 06/19/18 1932 06/20/18 1006 06/22/18 0443 06/23/18 0739  WBC 9.2 11.6* 11.7* 9.0 9.7 8.4  NEUTROABS 7.0  --   --   --   --   --   HGB 10.5* 9.5* 9.8* 9.1* 10.0* 8.9*  HCT 33.1* 29.1* 29.8* 28.5* 31.2* 27.9*  MCV 100.6* 99.0 94.9 96.3 96.3 97.2  PLT 189 174 259 236 285 307   Cardiac Enzymes: Recent Labs  Lab 06/17/18 0830 06/17/18 1450  TROPONINI <0.03 <0.03   BNP: Invalid input(s): POCBNP CBG: Recent Labs  Lab 06/22/18 0736 06/22/18 1121 06/22/18 1642 06/22/18 2124 06/23/18 1300  GLUCAP 341* 236* 187* 436* 196*   D-Dimer No results for input(s): DDIMER in the last 72 hours. Hgb A1c No results for input(s): HGBA1C in the last 72 hours. Lipid Profile No results for input(s): CHOL, HDL, LDLCALC, TRIG, CHOLHDL, LDLDIRECT in the last 72 hours. Thyroid function studies No results for input(s): TSH, T4TOTAL, T3FREE, THYROIDAB in the last 72 hours.  Invalid input(s): FREET3 Anemia work up No results for input(s): VITAMINB12, FOLATE, FERRITIN, TIBC, IRON, RETICCTPCT in the last 72 hours. Urinalysis    Component Value Date/Time   COLORURINE YELLOW 07/04/2010 1840   APPEARANCEUR TURBID (A) 07/04/2010 1840   LABSPEC 1.013 07/04/2010 1840   PHURINE 7.0 07/04/2010 1840   GLUCOSEU NEG mg/dL 07/04/2010 1840   HGBUR TRACE (A) 03/13/2010 1824   BILIRUBINUR NEG 07/04/2010 1840   KETONESUR NEG mg/dL 07/04/2010 1840   PROTEINUR > 300 mg/dL (A) 07/04/2010 1840   UROBILINOGEN 0.2 07/04/2010 1840   NITRITE NEG 07/04/2010 1840   LEUKOCYTESUR LARGE (A) 07/04/2010 1840   Sepsis Labs Invalid input(s): PROCALCITONIN,  WBC,  LACTICIDVEN Microbiology Recent Results (from the  past 240 hour(s))  Blood Culture (routine x 2)     Status: Abnormal   Collection Time: 06/17/18  2:14 AM  Result Value Ref Range Status   Specimen Description BLOOD RIGHT ARM  Final   Special Requests   Final    BOTTLES DRAWN AEROBIC AND ANAEROBIC Blood Culture adequate volume Performed at [facility], [place] [address], [place], [place] [address]    Culture  Setup Time   Final    GRAM POSITIVE COCCI AEROBIC BOTTLE ONLY CRITICAL VALUE NOTED.  VALUE IS CONSISTENT WITH PREVIOUSLY REPORTED AND CALLED VALUE.    Culture (A)  Final    STAPHYLOCOCCUS AUREUS SUSCEPTIBILITIES PERFORMED ON PREVIOUS CULTURE WITHIN THE LAST 5 DAYS. Performed at  [facility], [place] [address] [place], [place] [address]    Report Status 06/20/2018 FINAL  Final  Blood Culture (routine x 2)     Status: Abnormal   Collection Time: 06/17/18  2:14 AM  Result Value Ref Range Status   Specimen Description   Final    BLOOD RIGHT HAND Performed at [facility] [address], [place], [place] [address]    Special Requests   Final  BOTTLES DRAWN AEROBIC AND ANAEROBIC Blood Culture adequate volume Performed at [facility] [address], [place], [place] [address]    Culture  Setup Time   Final    IN BOTH AEROBIC AND ANAEROBIC BOTTLES GRAM POSITIVE COCCI CRITICAL RESULT CALLED TO, READ BACK BY AND VERIFIED WITH: C AMEND [facility] 06/18/18 0135 JDW Performed at [facility], [place] [address] [place], [place] [address]    Culture METHICILLIN RESISTANT STAPHYLOCOCCUS AUREUS (A)  Final   Report Status 06/20/2018 FINAL  Final   Organism ID, Bacteria METHICILLIN RESISTANT STAPHYLOCOCCUS AUREUS  Final      Susceptibility   Methicillin resistant staphylococcus aureus - MIC*    CIPROFLOXACIN <=0.5 SENSITIVE Sensitive     ERYTHROMYCIN >=8 RESISTANT Resistant     GENTAMICIN <=0.5 SENSITIVE Sensitive     OXACILLIN >=4 RESISTANT Resistant     TETRACYCLINE >=16  RESISTANT Resistant     VANCOMYCIN <=0.5 SENSITIVE Sensitive     TRIMETH/SULFA <=10 SENSITIVE Sensitive     CLINDAMYCIN <=0.25 SENSITIVE Sensitive     RIFAMPIN <=0.5 SENSITIVE Sensitive     Inducible Clindamycin NEGATIVE Sensitive     * METHICILLIN RESISTANT STAPHYLOCOCCUS AUREUS  Blood Culture ID Panel (Reflexed)     Status: Abnormal   Collection Time: 06/17/18  2:14 AM  Result Value Ref Range Status   Enterococcus species NOT DETECTED NOT DETECTED Final   Listeria monocytogenes NOT DETECTED NOT DETECTED Final   Staphylococcus species DETECTED (A) NOT DETECTED Final    Comment: CRITICAL RESULT CALLED TO, READ BACK BY AND VERIFIED WITH: C AMEND PHARMD 06/18/18 0133 JDW    Staphylococcus aureus (BCID) DETECTED (A) NOT DETECTED Final    Comment: Methicillin (oxacillin)-resistant Staphylococcus aureus (MRSA). MRSA is predictably resistant to beta-lactam antibiotics (except ceftaroline). Preferred therapy is vancomycin unless clinically contraindicated. Patient requires contact precautions if  hospitalized. CRITICAL RESULT CALLED TO, READ BACK BY AND VERIFIED WITH: C AMEND PHARMD 06/18/18 0133 JDW    Methicillin resistance DETECTED (A) NOT DETECTED Final    Comment: CRITICAL RESULT CALLED TO, READ BACK BY AND VERIFIED WITH: C AMEND PHARMD 06/18/18 0133 JDW    Streptococcus species NOT DETECTED NOT DETECTED Final   Streptococcus agalactiae NOT DETECTED NOT DETECTED Final   Streptococcus pneumoniae NOT DETECTED NOT DETECTED Final   Streptococcus pyogenes NOT DETECTED NOT DETECTED Final   Acinetobacter baumannii NOT DETECTED NOT DETECTED Final   Enterobacteriaceae species NOT DETECTED NOT DETECTED Final   Enterobacter cloacae complex NOT DETECTED NOT DETECTED Final   Escherichia coli NOT DETECTED NOT DETECTED Final   Klebsiella oxytoca NOT DETECTED NOT DETECTED Final   Klebsiella pneumoniae NOT DETECTED NOT DETECTED Final   Proteus species NOT DETECTED NOT DETECTED Final   Serratia marcescens  NOT DETECTED NOT DETECTED Final   Haemophilus influenzae NOT DETECTED NOT DETECTED Final   Neisseria meningitidis NOT DETECTED NOT DETECTED Final   Pseudomonas aeruginosa NOT DETECTED NOT DETECTED Final   Candida albicans NOT DETECTED NOT DETECTED Final   Candida glabrata NOT DETECTED NOT DETECTED Final   Candida krusei NOT DETECTED NOT DETECTED Final   Candida parapsilosis NOT DETECTED NOT DETECTED Final   Candida tropicalis NOT DETECTED NOT DETECTED Final    Comment: Performed at [facility], [place]. [address] [place], [place] [address]  Culture, blood (routine x 2)     Status: None (Preliminary result)   Collection Time: 06/19/18  3:50 PM  Result Value Ref Range Status   Specimen Description  BLOOD RIGHT ARM  Final   Special Requests   Final    BOTTLES DRAWN AEROBIC AND ANAEROBIC Blood Culture adequate volume   Culture   Final    NO GROWTH 4 DAYS Performed at [facility], [address] [place], [place] [address]    Report Status PENDING  Incomplete  Culture, blood (routine x 2)     Status: None (Preliminary result)   Collection Time: 06/19/18  4:00 PM  Result Value Ref Range Status   Specimen Description BLOOD RIGHT HAND  Final   Special Requests   Final    BOTTLES DRAWN AEROBIC ONLY Blood Culture adequate volume   Culture   Final    NO GROWTH 4 DAYS Performed at [facility], [place] [address], [place], [place] [address]    Report Status PENDING  Incomplete    SIGNED:   Cordelia Poche, MD [facility] 06/23/2018, 1:42 PM

## 2018-06-23 NOTE — Consult Note (Signed)
ORTHOPAEDIC CONSULTATION  REQUESTING PHYSICIAN: Mariel Aloe, MD  Chief Complaint: Right heel ulcer.  HPI: Alvin Daniels is a 48 y.o. 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  Past Medical History:  Diagnosis Date  . AIDS ([place]) 11/22/2014  . Chronic diarrhea   . Chronic hepatitis C without hepatic coma ([place]) 11/22/2014  . Diabetic neuropathy ([place])   . ESRD (end stage renal disease) on dialysis [facility])    "TTS; don't remember street name" (05/03/2014)  . Hepatitis C   . HIV INFECTION 06/27/2010   Qualifier: Diagnosis of  By: Nickola Major CMA ( [place]), Geni Bers    . Hypotension 06/02/2012  . Metabolic bone disease 0/01/7352  . MRSA infection   . Normocytic anemia 06/17/2012  . Pancreatitis   . Pressure ulcer of BKA stump, stage 2 ([place]) 11/22/2014  . Renal disorder   . Severe protein-calorie malnutrition ([place]) 06/17/2012  . Uncontrolled diabetes mellitus with complications ([place]) 2/99/2426   Annotation: uncontrolled Qualifier: Diagnosis of  By: Nickola Major CMA Deborra Medina), Geni Bers     Past Surgical History:  Procedure Laterality Date  . AMPUTATION Left 04/20/2014   Procedure: 3rd toe amputation, 4th Toe Amputation,  5th Toe Amputation;  Surgeon: Newt Minion, MD;  Location: [place];  Service: Orthopedics;  Laterality: Left;  . AMPUTATION Left 05/02/2014   Procedure: Midfoot Amputation;  Surgeon: Newt Minion, MD;  Location: [place];  Service: Orthopedics;  Laterality: Left;  . AMPUTATION Left 06/17/2014   Procedure: AMPUTATION BELOW KNEE;  Surgeon: Newt Minion, MD;  Location: [place];  Service: Orthopedics;  Laterality: Left;  . AV FISTULA PLACEMENT Left   . AV FISTULA PLACEMENT Left 05/10/2016   Procedure: Creation Left Arm Brachiocephalic Arteriovenous Fistula and Ligation of Radiocephalic Fistula;  Surgeon: Angelia Mould, MD;  Location: [place];  Service: Vascular;   Laterality: Left;  . FEMUR IM NAIL Left 08/17/2016   Procedure: INTRAMEDULLARY (IM) RETROGRADE FEMORAL NAILING;  Surgeon: Marybelle Killings, MD;  Location: [place];  Service: Orthopedics;  Laterality: Left;  . FOOT AMPUTATION THROUGH ANKLE Left 12/'21/2015   midfoot  . IR GENERIC HISTORICAL Left 05/01/2016   IR THROMBECTOMY AV FISTULA W/THROMBOLYSIS/PTA INC/SHUNT/IMG LEFT 05/01/2016 [name], MD [facility]  . IR GENERIC HISTORICAL  05/01/2016   IR US GUIDE VASC ACCESS LEFT 05/01/2016 [name], MD [facility]  . IR GENERIC HISTORICAL  05/07/2016   IR FLUORO GUIDE CV LINE RIGHT 05/07/2016 Corrie Mckusick, DO [facility]  . IR GENERIC HISTORICAL  05/07/2016   IR US GUIDE VASC ACCESS RIGHT 05/07/2016 Corrie Mckusick, DO [facility]  . IR GENERIC HISTORICAL  05/22/2016   IR US GUIDE VASC ACCESS RIGHT 05/22/2016 Greggory Keen, MD [facility]  . IR GENERIC HISTORICAL  05/22/2016   IR FLUORO GUIDE CV LINE RIGHT 05/22/2016 Greggory Keen, MD [facility]  . IR REMOVAL TUN CV CATH W/O FL  08/21/2016  . PERIPHERAL VASCULAR CATHETERIZATION Left 05/09/2016   Procedure: A/V Fistulagram;  Surgeon: Angelia Mould, MD;  Location: [place] [facility];  Service: Cardiovascular;  Laterality: Left;  arm   Social History   Socioeconomic History  . Marital status: Single    Spouse name: Not on file  . Number of children: Not on file  . Years of education: Not on file  . Highest education level: Not on file  Occupational History  . Not on  file  Social Needs  . Financial resource strain: Not on file  . Food insecurity:    Worry: Not on file    Inability: Not on file  . Transportation needs:    Medical: Not on file    Non-medical: Not on file  Tobacco Use  . Smoking status: Never Smoker  . Smokeless tobacco: Never Used  Substance and Sexual Activity  . Alcohol use: No  . Drug use: No  . Sexual activity: Never  Lifestyle  . Physical activity:    Days per week: Not on file    Minutes  per session: Not on file  . Stress: Not on file  Relationships  . Social connections:    Talks on phone: Not on file    Gets together: Not on file    Attends religious service: Not on file    Active member of club or organization: Not on file    Attends meetings of clubs or organizations: Not on file    Relationship status: Not on file  Other Topics Concern  . Not on file  Social History Narrative   ** Merged History Encounter **       Family History  Problem Relation Age of Onset  . Diabetes Mother   . Diabetes Father    - negative except otherwise stated in the family history section No Known Allergies Prior to Admission medications   Medication Sig Start Date End Date Taking? Authorizing Provider  bictegravir-emtricitabine-tenofovir AF (BIKTARVY) 50-200-25 MG TABS tablet Take 1 tablet by mouth daily. Patient not taking: Reported on 06/16/2018 11/19/17   Thayer Headings, MD   No results found. - pertinent xrays, CT, MRI studies were reviewed and independently interpreted  Positive ROS: All other systems have been reviewed and were otherwise negative with the exception of those mentioned in the HPI and as above.  Physical Exam: General: Alert, no acute distress Psychiatric: Patient is competent for consent with normal mood and affect Lymphatic: No axillary or cervical lymphadenopathy Cardiovascular: No pedal edema Respiratory: No cyanosis, no use of accessory musculature GI: No organomegaly, abdomen is soft and non-tender    Images:  @ENCIMAGES @  Labs:  Lab Results  Component Value Date   HGBA1C 9.0 (H) 06/18/2018   HGBA1C 13.4 (H) 11/19/2017   HGBA1C 5.0 05/24/2016   ESRSEDRATE 132 (H) 08/15/2016   ESRSEDRATE 84 (H) 06/10/2014   ESRSEDRATE 104 (H) 04/18/2014   CRP 28.3 (H) 08/15/2016   CRP 17.8 (H) 04/18/2014   CRP 0.13 (L) 01/11/2011   REPTSTATUS PENDING 06/19/2018   GRAMSTAIN  04/18/2014    RARE WBC PRESENT, PREDOMINANTLY PMN NO SQUAMOUS EPITHELIAL  CELLS SEEN FEW GRAM NEGATIVE RODS Performed at [place]  06/19/2018    NO GROWTH 3 DAYS Performed at [facility], [place] [address] [place], [place] [address]    LABORGA METHICILLIN RESISTANT STAPHYLOCOCCUS AUREUS 06/17/2018    Lab Results  Component Value Date   ALBUMIN 2.3 (L) 06/20/2018   ALBUMIN 2.3 (L) 06/19/2018   ALBUMIN 2.3 (L) 06/18/2018    Neurologic: Patient does not have protective sensation bilateral lower extremities.   MUSCULOSKELETAL:   Skin: Examination patient has a superficial ulcer on the right heel this is 7 mm in diameter 0.1 mm deep this does not probe to bone there is no drainage no odor the wound has good granulation tissue.  Patient has a palpable dorsalis pedis pulse his foot is warm.  There is good  capillary refill.  There is no tenderness to palpation around the foot or ankle no ascending cellulitis.  Review of the MRI scan does show some fluid on the plantar aspect of the heel but does not show osteomyelitis.  There is destruction of the calcaneus.  Albumin 2.3 with severe protein caloric malnutrition.  Assessment: Assessment: Diabetic insensate neuropathy with end-stage renal disease on dialysis with left transtibial amputation with ulceration right heel.  Plan: Plan: Would continue a course of IV antibiotics with dialysis.  Patient's surgical option if he fails treatment with IV antibiotics would be a transtibial amputation.  I will follow-up in the office.  Thank you for the consult and the opportunity to see [name], [place] [phone] 6:56 AM

## 2018-06-23 NOTE — Progress Notes (Addendum)
Subjective:  No new complaints Antibiotics:  Anti-infectives (From admission, onward)   Start     Dose/Rate Route Frequency Ordered Stop   06/23/18 0947  vancomycin (VANCOCIN) 500-5 MG/100ML-% IVPB    Note to Pharmacy:  Wallace Cullens   : cabinet override      06/23/18 0947 06/23/18 2159   06/18/18 1200  vancomycin (VANCOCIN) IVPB 500 mg/100 ml premix     500 mg 100 mL/hr over 60 Minutes Intravenous Every T-Th-Sa (Hemodialysis) 06/18/18 0141     06/18/18 0940  vancomycin (VANCOCIN) 500-5 MG/100ML-% IVPB    Note to Pharmacy:  Rodell Perna   : cabinet override      06/18/18 0940 06/18/18 1116   06/18/18 0200  ceFEPIme (MAXIPIME) 1 g in sodium chloride 0.9 % 100 mL IVPB  Status:  Discontinued     1 g 200 mL/hr over 30 Minutes Intravenous Every 24 hours 06/17/18 0745 06/18/18 0215   06/17/18 1500  bictegravir-emtricitabine-tenofovir AF (BIKTARVY) 50-200-25 MG per tablet 1 tablet     1 tablet Oral Daily 06/17/18 0819     06/17/18 0743  vancomycin variable dose per unstable renal function (pharmacist dosing)  Status:  Discontinued      Does not apply See admin instructions 06/17/18 0745 06/18/18 0141   06/17/18 0145  ceFEPIme (MAXIPIME) 2 g in sodium chloride 0.9 % 100 mL IVPB     2 g 200 mL/hr over 30 Minutes Intravenous  Once 06/17/18 0138 06/17/18 0309   06/17/18 0145  metroNIDAZOLE (FLAGYL) IVPB 500 mg  Status:  Discontinued     500 mg 100 mL/hr over 60 Minutes Intravenous Every 8 hours 06/17/18 0138 06/18/18 0215   06/17/18 0145  vancomycin (VANCOCIN) IVPB 1000 mg/200 mL premix  Status:  Discontinued     1,000 mg 200 mL/hr over 60 Minutes Intravenous  Once 06/17/18 0138 06/17/18 0143   06/17/18 0145  vancomycin (VANCOCIN) 1,250 mg in sodium chloride 0.9 % 250 mL IVPB     1,250 mg 166.7 mL/hr over 90 Minutes Intravenous  Once 06/17/18 0143 06/17/18 0656      Medications: Scheduled Meds: . bictegravir-emtricitabine-tenofovir AF  1 tablet Oral Daily  .  Chlorhexidine Gluconate Cloth  6 each Topical Q0600  . Chlorhexidine Gluconate Cloth  6 each Topical Q0600  . doxercalciferol      . doxercalciferol  3 mcg Intravenous Q T,Th,Sa-HD  . ferric citrate  420 mg Oral TID WC  . [START ON 06/24/2018] heparin  2,000 Units Dialysis Once in dialysis  . heparin  5,000 Units Subcutaneous Q8H  . insulin aspart  0-15 Units Subcutaneous TID WC  . insulin aspart  0-5 Units Subcutaneous QHS  . midodrine  5 mg Oral TID WC  . vancomycin      . vancomycin  500 mg Intravenous Q T,Th,Sa-HD   Continuous Infusions: PRN Meds:.acetaminophen, ondansetron (ZOFRAN) IV, oxyCODONE    Objective: Weight change: 0 kg  Intake/Output Summary (Last 24 hours) at 06/23/2018 1207 Last data filed at 06/22/2018 1832 Gross per 24 hour  Intake 440 ml  Output -  Net 440 ml   Blood pressure 109/62, pulse 82, temperature 98.1 F (36.7 C), temperature source Oral, resp. rate 15, height 5' 1"  (1.549 m), weight 51.6 kg, SpO2 99 %. Temp:  [98.1 F (36.7 C)-99.7 F (37.6 C)] 98.1 F (36.7 C) (02/11 0721) Pulse Rate:  [75-98] 82 (02/11 1000) Resp:  [15-25] 15 (02/11 0721) BP: (89-145)/(44-74) 109/62 (02/11 1000)  SpO2:  [94 %-100 %] 99 % (02/11 0721) Weight:  [51.6 kg-51.7 kg] 51.6 kg (02/11 0721)  Physical Exam: General: Alert and awake, oriented x3, not in any acute distress. HEENT: anicteric sclera, EOMI CVS regular rate, normal no murmurs gallops or rubs Chest: , no wheezing, no respiratory distress Abdomen: soft non-distended,  Extremities: Right lower extremity is unchanged Skin: Fistula site is clean Neuro: nonfocal  CBC:    BMET Recent Labs    06/22/18 0443 06/23/18 0739  NA 134* 131*  K 4.6 5.3*  CL 97* 95*  CO2 22 20*  GLUCOSE 301* 377*  BUN 38* 53*  CREATININE 8.95* 11.36*  CALCIUM 8.6* 8.9     Liver Panel  Recent Labs    06/23/18 0739  ALBUMIN 2.1*       Sedimentation Rate No results for input(s): ESRSEDRATE in the last 72  hours. C-Reactive Protein No results for input(s): CRP in the last 72 hours.  Micro Results: Recent Results (from the past 720 hour(s))  Blood Culture (routine x 2)     Status: Abnormal   Collection Time: 06/17/18  2:14 AM  Result Value Ref Range Status   Specimen Description BLOOD RIGHT ARM  Final   Special Requests   Final    BOTTLES DRAWN AEROBIC AND ANAEROBIC Blood Culture adequate volume Performed at [facility] [address] [place], [place] [address]    Culture  Setup Time   Final    GRAM POSITIVE COCCI AEROBIC BOTTLE ONLY CRITICAL VALUE NOTED.  VALUE IS CONSISTENT WITH PREVIOUSLY REPORTED AND CALLED VALUE.    Culture (A)  Final    STAPHYLOCOCCUS AUREUS SUSCEPTIBILITIES PERFORMED ON PREVIOUS CULTURE WITHIN THE LAST 5 DAYS. Performed at [facility], [place] [address] [place], [place] [address]    Report Status 06/20/2018 FINAL  Final  Blood Culture (routine x 2)     Status: Abnormal   Collection Time: 06/17/18  2:14 AM  Result Value Ref Range Status   Specimen Description   Final    BLOOD RIGHT HAND Performed at [facility] [address], [place], [place] [address]    Special Requests   Final    BOTTLES DRAWN AEROBIC AND ANAEROBIC Blood Culture adequate volume Performed at [facility] [address], [place], [place] [address]    Culture  Setup Time   Final    IN BOTH AEROBIC AND ANAEROBIC BOTTLES GRAM POSITIVE COCCI CRITICAL RESULT CALLED TO, READ BACK BY AND VERIFIED WITH: C AMEND [facility] 06/18/18 0135 JDW Performed at [facility], [place] [address], [place], [place] [address]    Culture METHICILLIN RESISTANT STAPHYLOCOCCUS AUREUS (A)  Final   Report Status 06/20/2018 FINAL  Final   Organism ID, Bacteria METHICILLIN RESISTANT STAPHYLOCOCCUS AUREUS  Final      Susceptibility   Methicillin resistant staphylococcus aureus - MIC*    CIPROFLOXACIN <=0.5 SENSITIVE Sensitive     ERYTHROMYCIN >=8  RESISTANT Resistant     GENTAMICIN <=0.5 SENSITIVE Sensitive     OXACILLIN >=4 RESISTANT Resistant     TETRACYCLINE >=16 RESISTANT Resistant     VANCOMYCIN <=0.5 SENSITIVE Sensitive     TRIMETH/SULFA <=10 SENSITIVE Sensitive     CLINDAMYCIN <=0.25 SENSITIVE Sensitive     RIFAMPIN <=0.5 SENSITIVE Sensitive     Inducible Clindamycin NEGATIVE Sensitive     * METHICILLIN RESISTANT STAPHYLOCOCCUS AUREUS  Blood Culture ID Panel (Reflexed)     Status: Abnormal  Collection Time: 06/17/18  2:14 AM  Result Value Ref Range Status   Enterococcus species NOT DETECTED NOT DETECTED Final   Listeria monocytogenes NOT DETECTED NOT DETECTED Final   Staphylococcus species DETECTED (A) NOT DETECTED Final    Comment: CRITICAL RESULT CALLED TO, READ BACK BY AND VERIFIED WITH: C AMEND PHARMD 06/18/18 0133 JDW    Staphylococcus aureus (BCID) DETECTED (A) NOT DETECTED Final    Comment: Methicillin (oxacillin)-resistant Staphylococcus aureus (MRSA). MRSA is predictably resistant to beta-lactam antibiotics (except ceftaroline). Preferred therapy is vancomycin unless clinically contraindicated. Patient requires contact precautions if  hospitalized. CRITICAL RESULT CALLED TO, READ BACK BY AND VERIFIED WITH: C AMEND PHARMD 06/18/18 0133 JDW    Methicillin resistance DETECTED (A) NOT DETECTED Final    Comment: CRITICAL RESULT CALLED TO, READ BACK BY AND VERIFIED WITH: C AMEND PHARMD 06/18/18 0133 JDW    Streptococcus species NOT DETECTED NOT DETECTED Final   Streptococcus agalactiae NOT DETECTED NOT DETECTED Final   Streptococcus pneumoniae NOT DETECTED NOT DETECTED Final   Streptococcus pyogenes NOT DETECTED NOT DETECTED Final   Acinetobacter baumannii NOT DETECTED NOT DETECTED Final   Enterobacteriaceae species NOT DETECTED NOT DETECTED Final   Enterobacter cloacae complex NOT DETECTED NOT DETECTED Final   Escherichia coli NOT DETECTED NOT DETECTED Final   Klebsiella oxytoca NOT DETECTED NOT DETECTED Final    Klebsiella pneumoniae NOT DETECTED NOT DETECTED Final   Proteus species NOT DETECTED NOT DETECTED Final   Serratia marcescens NOT DETECTED NOT DETECTED Final   Haemophilus influenzae NOT DETECTED NOT DETECTED Final   Neisseria meningitidis NOT DETECTED NOT DETECTED Final   Pseudomonas aeruginosa NOT DETECTED NOT DETECTED Final   Candida albicans NOT DETECTED NOT DETECTED Final   Candida glabrata NOT DETECTED NOT DETECTED Final   Candida krusei NOT DETECTED NOT DETECTED Final   Candida parapsilosis NOT DETECTED NOT DETECTED Final   Candida tropicalis NOT DETECTED NOT DETECTED Final    Comment: Performed at [facility], [place]. [address] [place], [place] [address]  Culture, blood (routine x 2)     Status: None (Preliminary result)   Collection Time: 06/19/18  3:50 PM  Result Value Ref Range Status   Specimen Description BLOOD RIGHT ARM  Final   Special Requests   Final    BOTTLES DRAWN AEROBIC AND ANAEROBIC Blood Culture adequate volume   Culture   Final    NO GROWTH 4 DAYS Performed at [facility], [address] [place], [place] [address]    Report Status PENDING  Incomplete  Culture, blood (routine x 2)     Status: None (Preliminary result)   Collection Time: 06/19/18  4:00 PM  Result Value Ref Range Status   Specimen Description BLOOD RIGHT HAND  Final   Special Requests   Final    BOTTLES DRAWN AEROBIC ONLY Blood Culture adequate volume   Culture   Final    NO GROWTH 4 DAYS Performed at [facility], [place] [address] [place], [place] [address]    Report Status PENDING  Incomplete    Studies/Results: No results found.    Assessment/Plan:  INTERVAL HISTORY: Dr Sharol Given has seen pt reviewed him that showed no osteomyelitis but do show some traction of the calcaneus and fluid in the plantar aspect of the heel.  He feels that we should not do a surgical intervention as this will require another transtibial amputation at this point in time but try antimicrobial  therapy.   Principal  Problem:   Bacteremia Active Problems:   Diabetes mellitus with ESRD (end-stage renal disease) ([place])   Hypotension   ESRD on dialysis ([place])   Sepsis ([place])   Chronic hepatitis C without hepatic coma ([name])   HIV (human immunodeficiency virus infection) ([place])   Diabetic foot ulcer ([place])   MRSA bacteremia    Alvin Daniels is a 48 y.o. male with HIV which is perfectly well controlled, end-stage renal disease on hemodialysis, history of left below the knee amputation, now admitted with MRSA bacteremia and sepsis.  1.  MRSA bacteremia:       [facility] Staphylococcus aureus bacteremia   Staphylococcus aureus bacteremia (SAB) is associated with a high rate of complications and mortality.  Specific aspects of clinical management are critical to optimizing the outcome of patients with SAB.  Therefore, the [facility] Antimicrobial Management Team [facility]) has initiated an intervention aimed at improving the management of SAB at [facility].  To do so, Infectious Diseases physicians are providing an evidence-based consult for the management of all patients with SAB.     Yes No Comments  Perform follow-up blood cultures (even if the patient is afebrile) to ensure clearance of bacteremia [x]  []   blood cultures taken Friday no growth still  Remove vascular catheter and obtain follow-up blood cultures after the removal of the catheter []  []   no catheters in place and fistula does not appear infected  Perform echocardiography to evaluate for endocarditis (transthoracic ECHO is 40-50% sensitive, TEE is > 90% sensitive) [x]  []  TEE clean       Ensure source control [x]  []  Continue to monitor right foot,   Investigate for "metastatic" sites of infection [x]  []  Does the patient have ANY symptom or physical exam finding that would suggest a deeper infection (back or neck pain that may be suggestive of vertebral osteomyelitis or epidural abscess,  muscle pain that could be a symptom of pyomyositis)?  Keep in mind that for deep seeded infections MRI imaging with contrast is preferred rather than other often insensitive tests such as plain x-rays, especially early in a patient's presentation.  Change antibiotic therapy to vancomycin []  []  Beta-lactam antibiotics are preferred for MSSA due to higher cure rates.   If on Vancomycin, goal trough should be 15 - 20 mcg/mL  Estimated duration of IV antibiotic therapy:  6 weeks with HD []  []  Consult case management for probably prolonged outpatient IV antibiotic therapy   #2 HIV: continue Biktarvy, he has completed HMAP paperwork.  #3 Need for prosthesis:he can be DC today and retrieve prosthesis tomorrow   Diagnosis: MRSAB and concern for foot infection Culture Result: Methicillin resistant Staphylococcus aureus  No Known Allergies  OPAT Orders Discharge antibiotics: Vancomycin is with dialysis Per pharmacy  Aim for Vancomycin trough 15-20 (unless otherwise indicated)  Duration: 6 weeks End Date:  March 19th, 2020    Labs weekly while on IV antibiotics: _x_ CBC with differential  _x_ CRP _x_ ESR _x_ Vancomycin trough   Fax weekly labs to [phone]  Clinic Follow Up Appt:  March 5th, 1045 am with Dr. Tommy Medal      LOS: 6 days   Rhina [place] 06/23/2018, 12:07 PM

## 2018-06-23 NOTE — Progress Notes (Signed)
Provided D/C instructions , spanish version using [facility] in person. All questions answered and no further concerns verbalized. Discharged home via wheelchair with family present at the time of transfer. Leveda Anna, BSN, RN

## 2018-06-24 LAB — CULTURE, BLOOD (ROUTINE X 2)
CULTURE: NO GROWTH
Culture: NO GROWTH
Special Requests: ADEQUATE
Special Requests: ADEQUATE

## 2018-06-25 ENCOUNTER — Telehealth: Payer: Self-pay | Admitting: Behavioral Health

## 2018-06-25 NOTE — Telephone Encounter (Signed)
Patient called stating he was supposed to receive Vancomycin at his dialysis treatment starting today but [facility] did not have orders.  Orders signed by Janene Madeira and were faxed and patient made aware.    Patient to receive Vancomycin 538m IV Tuesday, Thursday, Saturday with Dialysis. End date 07/30/2018.  Weekly CBC with diff, ESR, CRP,  Vancomycin Trough weekly. APricilla RiffleRN

## 2018-06-25 NOTE — Telephone Encounter (Signed)
I wonder if we can not put a safety portal zone in for this. This should NOT happen

## 2018-06-30 ENCOUNTER — Inpatient Hospital Stay: Payer: Self-pay | Admitting: Internal Medicine

## 2018-07-06 ENCOUNTER — Encounter: Payer: Self-pay | Admitting: Internal Medicine

## 2018-07-06 ENCOUNTER — Ambulatory Visit: Payer: Self-pay | Attending: Internal Medicine | Admitting: Internal Medicine

## 2018-07-06 VITALS — BP 123/70 | HR 89 | Temp 98.3°F | Resp 16 | Wt 121.0 lb

## 2018-07-06 DIAGNOSIS — B182 Chronic viral hepatitis C: Secondary | ICD-10-CM

## 2018-07-06 DIAGNOSIS — L97411 Non-pressure chronic ulcer of right heel and midfoot limited to breakdown of skin: Secondary | ICD-10-CM

## 2018-07-06 DIAGNOSIS — R7881 Bacteremia: Secondary | ICD-10-CM

## 2018-07-06 DIAGNOSIS — E11621 Type 2 diabetes mellitus with foot ulcer: Secondary | ICD-10-CM

## 2018-07-06 DIAGNOSIS — Z992 Dependence on renal dialysis: Secondary | ICD-10-CM

## 2018-07-06 DIAGNOSIS — E1122 Type 2 diabetes mellitus with diabetic chronic kidney disease: Secondary | ICD-10-CM

## 2018-07-06 DIAGNOSIS — Z21 Asymptomatic human immunodeficiency virus [HIV] infection status: Secondary | ICD-10-CM

## 2018-07-06 DIAGNOSIS — N186 End stage renal disease: Secondary | ICD-10-CM

## 2018-07-06 LAB — GLUCOSE, POCT (MANUAL RESULT ENTRY): POC Glucose: 241 mg/dl — AB (ref 70–99)

## 2018-07-06 NOTE — Patient Instructions (Signed)
Please bring your blood sugar readings in with you on your next visit.  Continue dressing changes to the right foot.

## 2018-07-06 NOTE — Progress Notes (Signed)
Patient ID: Alvin Daniels, male    DOB: December 13, 1970  MRN: [id number]  CC: Hospitalization Follow-up   Subjective: Alvin Daniels is a 48 y.o. male who presents for hospital follow-up.  Last seen 2017 His concerns today include:  Patient with history of HIV, chronic hep C, chronic hypotension, ESRD, left BKA, DM type 2  Patient hospitalized 2/4-03/2019 with fever and hypotension. Found to have MRSA bacteremia. He was treated with Vanco and cefepime.  Chest x-ray was without infiltrate.  TTE and TEE negative for valvular vegetations.  He was discovered to have a right foot wound with question of abscess on MRI but no osteomyelitis.  He was seen by orthopedic surgery who felt no surgical intervention was warranted and patient was to follow-up as an outpatient.  Plan for Vanco for 6 wks with HD with end date 07/30/2018  RT foot infection:  Reportedly saw ortho 2 wks ago and states he was told everything was good.  He does not recall the name of the specialist and does not have a card -doing dressing changes Q 2 days to for ulcer on plantar heel.  No drainage.  He reports that it is healing nicely and has significantly decreased in size compared to when he was in the hospital Rec Vanco at HD  ESRD:  HD on Tue/Thur/Sat Not sure if he gets Arnesp during HD for anemia  DM:  Checks BS 2 x a day. He does not have log with him.  BS before BF 80-90, highest 230.  BS before and after dinner 210-230  "it depends on what I eat."   On insulin 70/30 15 units in a.m 10 units in evening.  Feels he is doing okay with eating habits.  Drinks mainly water and [employer].  Eats 3-4 tortias a day.   Last eye exam was 4 mths ago.  Reports no changes from DM  HIV/Hep C:  Followed by Dr. Linus Salmons.  Last seen 11/2017, HIV quant undetectable at the time  Patient Active Problem List   Diagnosis Date Noted  . MRSA bacteremia   . Bacteremia 06/18/2018  . Diabetic foot ulcer ([place]) 11/19/2017  . Screening  examination for venereal disease 05/21/2017  . Femur fracture, left ([place]) 08/16/2016  . HIV (human immunodeficiency virus infection) ([place]) 08/16/2016  . GERD (gastroesophageal reflux disease) 05/24/2016  . Burn 04/10/2016  . Pressure ulcer of BKA stump, stage 2 ([place]) 11/22/2014  . Chronic hepatitis C without hepatic coma ([place]) 11/22/2014  . Below knee amputation status 06/17/2014  . Sepsis ([place])   . Poor dentition 10/25/2013  . ESRD on dialysis ([place]) 10/25/2013  . Vision changes 06/21/2013  . Orthostatic hypotension 04/15/2013  . Orthostatic headache 04/15/2013  . Anemia of chronic renal failure 06/19/2012  . Metabolic bone disease 33/43/5686  . Chronic diarrhea 06/17/2012  . Normocytic anemia 06/17/2012  . Syncope and collapse 06/02/2012  . Hypotension 06/02/2012  . Polyneuropathy in diabetes(357.2) 06/27/2010  . Diabetes mellitus with ESRD (end-stage renal disease) ([place]) 01/03/2010     Current Outpatient Medications on File Prior to Visit  Medication Sig Dispense Refill  . insulin NPH-regular Human (70-30) 100 UNIT/ML injection Inject into the skin. Inject 15 units subcutaneous in the a.m. and 10 units subcutaneous in the p.m. with meals    . acetaminophen (TYLENOL) 325 MG tablet Take 2 tablets (650 mg total) by mouth every 6 (six) hours as needed for mild pain.    . bictegravir-emtricitabine-tenofovir AF (BIKTARVY) 50-200-25 MG TABS tablet  Take 1 tablet by mouth daily. (Patient not taking: Reported on 06/16/2018) 30 tablet 11  . midodrine (PROAMATINE) 5 MG tablet Take 1 tablet (5 mg total) by mouth 3 (three) times daily with meals for 30 days. 90 tablet 0  . vancomycin (VANCOCIN) 500-5 MG/100ML-% IVPB Inject 100 mLs (500 mg total) into the vein Every Tuesday,Thursday,and Saturday with dialysis. End date: March 19th 2020. Check CBC w/ differential, ESR, CRP weekly     No current facility-administered medications on file prior to visit.     No Known Allergies  Social History    Socioeconomic History  . Marital status: Single    Spouse name: Not on file  . Number of children: Not on file  . Years of education: Not on file  . Highest education level: Not on file  Occupational History  . Not on file  Social Needs  . Financial resource strain: Not on file  . Food insecurity:    Worry: Not on file    Inability: Not on file  . Transportation needs:    Medical: Not on file    Non-medical: Not on file  Tobacco Use  . Smoking status: Never Smoker  . Smokeless tobacco: Never Used  Substance and Sexual Activity  . Alcohol use: No  . Drug use: No  . Sexual activity: Never  Lifestyle  . Physical activity:    Days per week: Not on file    Minutes per session: Not on file  . Stress: Not on file  Relationships  . Social connections:    Talks on phone: Not on file    Gets together: Not on file    Attends religious service: Not on file    Active member of club or organization: Not on file    Attends meetings of clubs or organizations: Not on file    Relationship status: Not on file  . Intimate partner violence:    Fear of current or ex partner: Not on file    Emotionally abused: Not on file    Physically abused: Not on file    Forced sexual activity: Not on file  Other Topics Concern  . Not on file  Social History Narrative   ** Merged History Encounter **        Family History  Problem Relation Age of Onset  . Diabetes Mother   . Diabetes Father     Past Surgical History:  Procedure Laterality Date  . AMPUTATION Left 04/20/2014   Procedure: 3rd toe amputation, 4th Toe Amputation,  5th Toe Amputation;  Surgeon: Newt Minion, MD;  Location: [place];  Service: Orthopedics;  Laterality: Left;  . AMPUTATION Left 05/02/2014   Procedure: Midfoot Amputation;  Surgeon: Newt Minion, MD;  Location: [place];  Service: Orthopedics;  Laterality: Left;  . AMPUTATION Left 06/17/2014   Procedure: AMPUTATION BELOW KNEE;  Surgeon: Newt Minion, MD;  Location: [place];  Service: Orthopedics;  Laterality: Left;  . AV FISTULA PLACEMENT Left   . AV FISTULA PLACEMENT Left 05/10/2016   Procedure: Creation Left Arm Brachiocephalic Arteriovenous Fistula and Ligation of Radiocephalic Fistula;  Surgeon: Angelia Mould, MD;  Location: [place];  Service: Vascular;  Laterality: Left;  . FEMUR IM NAIL Left 08/17/2016   Procedure: INTRAMEDULLARY (IM) RETROGRADE FEMORAL NAILING;  Surgeon: Marybelle Killings, MD;  Location: [place];  Service: Orthopedics;  Laterality: Left;  . FOOT AMPUTATION THROUGH ANKLE Left 12/'21/2015   midfoot  . IR GENERIC HISTORICAL  Left 05/01/2016   IR THROMBECTOMY AV FISTULA W/THROMBOLYSIS/PTA INC/SHUNT/IMG LEFT 05/01/2016 [name], MD [facility]  . IR GENERIC HISTORICAL  05/01/2016   IR US GUIDE VASC ACCESS LEFT 05/01/2016 [name], MD [facility]  . IR GENERIC HISTORICAL  05/07/2016   IR FLUORO GUIDE CV LINE RIGHT 05/07/2016 Corrie Mckusick, DO [facility]  . IR GENERIC HISTORICAL  05/07/2016   IR US GUIDE VASC ACCESS RIGHT 05/07/2016 Corrie Mckusick, DO [facility]  . IR GENERIC HISTORICAL  05/22/2016   IR US GUIDE VASC ACCESS RIGHT 05/22/2016 Greggory Keen, MD [facility]  . IR GENERIC HISTORICAL  05/22/2016   IR FLUORO GUIDE CV LINE RIGHT 05/22/2016 Greggory Keen, MD [facility]  . IR REMOVAL TUN CV CATH W/O FL  08/21/2016  . PERIPHERAL VASCULAR CATHETERIZATION Left 05/09/2016   Procedure: A/V Fistulagram;  Surgeon: Angelia Mould, MD;  Location: [place] [facility];  Service: Cardiovascular;  Laterality: Left;  arm  . TEE WITHOUT CARDIOVERSION N/A 06/22/2018   Procedure: TRANSESOPHAGEAL ECHOCARDIOGRAM (TEE);  Surgeon: Jerline Pain, MD;  Location: [facility] ENDOSCOPY;  Service: Cardiovascular;  Laterality: N/A;    ROS: Review of Systems Negative except as stated above  PHYSICAL EXAM: BP 123/70   Pulse 89   Temp 98.3 F (36.8 C) (Oral)   Resp 16   Wt 121 lb (54.9 kg)   SpO2 100%   BMI 22.86 kg/m   Physical  Exam  General appearance - alert, well appearing, and in no distress Mental status - normal mood, behavior, speech, dress, motor activity, and thought processes Eyes - pupils equal and reactive, extraocular eye movements intact Neck - supple, no significant adenopathy Chest - clear to auscultation, no wheezes, rales or rhonchi, symmetric air entry Heart - normal rate, regular rhythm, normal S1, S2, no murmurs, rubs, clicks or gallops Extremities -patient has below the knee amputation on the left.  Trace to 1+ lower extremity edema on the right. Skin -scattered hypopigmentation of the right leg and foot.  He has about a point 7 cm shallow ulcer on the plantar heel medial aspect.  No drainage noted.  No erythema or fluctuance  Results for orders placed or performed in visit on 07/06/18  POCT glucose (manual entry)  Result Value Ref Range   POC Glucose 241 (A) 70 - 99 mg/dl   Lab Results  Component Value Date   HGBA1C 9.0 (H) 06/18/2018     CMP Latest Ref Rng & Units 06/23/2018 06/22/2018 06/20/2018  Glucose 70 - 99 mg/dL 377(H) 301(H) 407(H)  BUN 6 - 20 mg/dL 53(H) 38(H) 52(H)  Creatinine 0.61 - 1.24 mg/dL 11.36(H) 8.95(H) 10.79(H)  Sodium 135 - 145 mmol/L 131(L) 134(L) 132(L)  Potassium 3.5 - 5.1 mmol/L 5.3(H) 4.6 4.4  Chloride 98 - 111 mmol/L 95(L) 97(L) 91(L)  CO2 22 - 32 mmol/L 20(L) 22 21(L)  Calcium 8.9 - 10.3 mg/dL 8.9 8.6(L) 8.6(L)  Total Protein 6.5 - 8.1 g/dL - - -  Total Bilirubin 0.3 - 1.2 mg/dL - - -  Alkaline Phos 38 - 126 U/L - - -  AST 15 - 41 U/L - - -  ALT 0 - 44 U/L - - -   Lipid Panel     Component Value Date/Time   CHOL 81 06/13/2014 1406   TRIG 67 06/13/2014 1406   HDL 30 (L) 06/13/2014 1406   CHOLHDL 2.7 06/13/2014 1406   VLDL 13 06/13/2014 1406   LDLCALC 38 06/13/2014 1406    CBC  Component Value Date/Time   WBC 8.4 06/23/2018 0739   RBC 2.87 (L) 06/23/2018 0739   HGB 8.9 (L) 06/23/2018 0739   HCT 27.9 (L) 06/23/2018 0739   PLT 307 06/23/2018  0739   MCV 97.2 06/23/2018 0739   MCH 31.0 06/23/2018 0739   MCHC 31.9 06/23/2018 0739   RDW 13.8 06/23/2018 0739   LYMPHSABS 1.0 06/16/2018 2300   MONOABS 0.6 06/16/2018 2300   EOSABS 0.1 06/16/2018 2300   BASOSABS 0.0 06/16/2018 2300    ASSESSMENT AND PLAN:  1. Diabetes mellitus with ESRD (end-stage renal disease) ([place]) Advised patient to keep a log of his blood sugar readings before meals and bring them in on next visit in 6 weeks.  Healthy eating habits discussed and encouraged.  He will continue his current dose of 70/30 insulin - POCT glucose (manual entry)  2. Diabetic ulcer of right heel associated with type 2 diabetes mellitus, limited to breakdown of skin [facility]) Patient will continue dressing changes to the right foot.  Does not appear to be infected at this time and by his report the ulcer has decreased in size  3. MRSA bacteremia To complete a course of Vanco during dialysis  4. ESRD on dialysis ([place])   5. Asymptomatic HIV infection ([place]) 6. Chronic hepatitis C without hepatic coma ([place]) Followed by Dr. Linus Salmons   Patient was given the opportunity to ask questions.  Patient verbalized understanding of the plan and was able to repeat key elements of the plan.  [employer] interpreter used during this encounter. #[id number]  Orders Placed This Encounter  Procedures  . POCT glucose (manual entry)     Requested Prescriptions    No prescriptions requested or ordered in this encounter    Return in about 1 month (around 08/04/2018).  Karle Plumber, MD, FACP

## 2018-07-16 ENCOUNTER — Inpatient Hospital Stay: Payer: Self-pay | Admitting: Infectious Disease

## 2018-07-16 NOTE — Progress Notes (Deleted)
Subjective:    Patient ID: Alvin Daniels, male    DOB: March 23, 1971, 48 y.o.   MRN: [id number]  HPI 48 y.o. male with HIV which is perfectly well controlled, end-stage renal disease on hemodialysis, history of left below the knee amputation, now admitted with MRSA bacteremia and sepsis. He had TEE that was negative for infection.He had DFU but this did not have osteo by MRI. He was slated to complete Vancomycin with HD but apparently did not start not on vanco with HD center until he called our office to let us know he was not getting abx. He was to get fitted with new prosthesis for opposite leg.  Past Medical History:  Diagnosis Date  . AIDS ([place]) 11/22/2014  . Chronic diarrhea   . Chronic hepatitis C without hepatic coma ([place]) 11/22/2014  . Diabetic neuropathy ([place])   . ESRD (end stage renal disease) on dialysis [facility])    "TTS; don't remember street name" (05/03/2014)  . Hepatitis C   . HIV INFECTION 06/27/2010   Qualifier: Diagnosis of  By: Nickola Major CMA ( [place]), Geni Bers    . Hypotension 06/02/2012  . Metabolic bone disease 07/14/5454  . MRSA infection   . Normocytic anemia 06/17/2012  . Pancreatitis   . Pressure ulcer of BKA stump, stage 2 ([place]) 11/22/2014  . Renal disorder   . Severe protein-calorie malnutrition ([place]) 06/17/2012  . Uncontrolled diabetes mellitus with complications ([place]) 2/56/3893   Annotation: uncontrolled Qualifier: Diagnosis of  By: Nickola Major CMA Deborra Medina), Geni Bers      Past Surgical History:  Procedure Laterality Date  . AMPUTATION Left 04/20/2014   Procedure: 3rd toe amputation, 4th Toe Amputation,  5th Toe Amputation;  Surgeon: Newt Minion, MD;  Location: [place];  Service: Orthopedics;  Laterality: Left;  . AMPUTATION Left 05/02/2014   Procedure: Midfoot Amputation;  Surgeon: Newt Minion, MD;  Location: [place];  Service: Orthopedics;  Laterality: Left;  . AMPUTATION Left 06/17/2014   Procedure: AMPUTATION BELOW KNEE;  Surgeon: Newt Minion, MD;   Location: [place];  Service: Orthopedics;  Laterality: Left;  . AV FISTULA PLACEMENT Left   . AV FISTULA PLACEMENT Left 05/10/2016   Procedure: Creation Left Arm Brachiocephalic Arteriovenous Fistula and Ligation of Radiocephalic Fistula;  Surgeon: Angelia Mould, MD;  Location: [place];  Service: Vascular;  Laterality: Left;  . FEMUR IM NAIL Left 08/17/2016   Procedure: INTRAMEDULLARY (IM) RETROGRADE FEMORAL NAILING;  Surgeon: Marybelle Killings, MD;  Location: [place];  Service: Orthopedics;  Laterality: Left;  . FOOT AMPUTATION THROUGH ANKLE Left 12/'21/2015   midfoot  . IR GENERIC HISTORICAL Left 05/01/2016   IR THROMBECTOMY AV FISTULA W/THROMBOLYSIS/PTA INC/SHUNT/IMG LEFT 05/01/2016 [name], MD [facility]  . IR GENERIC HISTORICAL  05/01/2016   IR US GUIDE VASC ACCESS LEFT 05/01/2016 [name], MD [facility]  . IR GENERIC HISTORICAL  05/07/2016   IR FLUORO GUIDE CV LINE RIGHT 05/07/2016 Corrie Mckusick, DO [facility]  . IR GENERIC HISTORICAL  05/07/2016   IR US GUIDE VASC ACCESS RIGHT 05/07/2016 Corrie Mckusick, DO [facility]  . IR GENERIC HISTORICAL  05/22/2016   IR US GUIDE VASC ACCESS RIGHT 05/22/2016 Greggory Keen, MD [facility]  . IR GENERIC HISTORICAL  05/22/2016   IR FLUORO GUIDE CV LINE RIGHT 05/22/2016 Greggory Keen, MD [facility]  . IR REMOVAL TUN CV CATH W/O FL  08/21/2016  . PERIPHERAL VASCULAR CATHETERIZATION Left 05/09/2016   Procedure: A/V Fistulagram;  Surgeon: Judeth Cornfield  Scot Dock, MD;  Location: [place] [facility];  Service: Cardiovascular;  Laterality: Left;  arm  . TEE WITHOUT CARDIOVERSION N/A 06/22/2018   Procedure: TRANSESOPHAGEAL ECHOCARDIOGRAM (TEE);  Surgeon: Jerline Pain, MD;  Location: [facility] ENDOSCOPY;  Service: Cardiovascular;  Laterality: N/A;    Family History  Problem Relation Age of Onset  . Diabetes Mother   . Diabetes Father       Social History   Socioeconomic History  . Marital status: Single    Spouse name: Not on file  .  Number of children: Not on file  . Years of education: Not on file  . Highest education level: Not on file  Occupational History  . Not on file  Social Needs  . Financial resource strain: Not on file  . Food insecurity:    Worry: Not on file    Inability: Not on file  . Transportation needs:    Medical: Not on file    Non-medical: Not on file  Tobacco Use  . Smoking status: Never Smoker  . Smokeless tobacco: Never Used  Substance and Sexual Activity  . Alcohol use: No  . Drug use: No  . Sexual activity: Never  Lifestyle  . Physical activity:    Days per week: Not on file    Minutes per session: Not on file  . Stress: Not on file  Relationships  . Social connections:    Talks on phone: Not on file    Gets together: Not on file    Attends religious service: Not on file    Active member of club or organization: Not on file    Attends meetings of clubs or organizations: Not on file    Relationship status: Not on file  Other Topics Concern  . Not on file  Social History Narrative   ** Merged History Encounter **        No Known Allergies   Current Outpatient Medications:  .  acetaminophen (TYLENOL) 325 MG tablet, Take 2 tablets (650 mg total) by mouth every 6 (six) hours as needed for mild pain., Disp: , Rfl:  .  bictegravir-emtricitabine-tenofovir AF (BIKTARVY) 50-200-25 MG TABS tablet, Take 1 tablet by mouth daily. (Patient not taking: Reported on 06/16/2018), Disp: 30 tablet, Rfl: 11 .  insulin NPH-regular Human (70-30) 100 UNIT/ML injection, Inject into the skin. Inject 15 units subcutaneous in the a.m. and 10 units subcutaneous in the p.m. with meals, Disp: , Rfl:  .  midodrine (PROAMATINE) 5 MG tablet, Take 1 tablet (5 mg total) by mouth 3 (three) times daily with meals for 30 days., Disp: 90 tablet, Rfl: 0 .  vancomycin (VANCOCIN) 500-5 MG/100ML-% IVPB, Inject 100 mLs (500 mg total) into the vein Every Tuesday,Thursday,and Saturday with dialysis. End date: March 19th  2020. Check CBC w/ differential, ESR, CRP weekly, Disp: , Rfl:       Review of Systems  Constitutional: Negative for activity change, appetite change, chills, diaphoresis, fatigue, fever and unexpected weight change.  HENT: Negative for congestion, rhinorrhea, sinus pressure, sneezing, sore throat and trouble swallowing.   Eyes: Negative for photophobia and visual disturbance.  Respiratory: Negative for cough, chest tightness, shortness of breath, wheezing and stridor.   Cardiovascular: Negative for chest pain, palpitations and leg swelling.  Gastrointestinal: Negative for abdominal distention, abdominal pain, anal bleeding, blood in stool, constipation, diarrhea, nausea and vomiting.  Genitourinary: Negative for difficulty urinating, dysuria, flank pain and hematuria.  Musculoskeletal: Negative for arthralgias, back pain, gait problem, joint swelling  and myalgias.  Skin: Negative for color change, pallor, rash and wound.  Neurological: Negative for dizziness, tremors, weakness and light-headedness.  Hematological: Negative for adenopathy. Does not bruise/bleed easily.  Psychiatric/Behavioral: Negative for agitation, behavioral problems, confusion, decreased concentration, dysphoric mood and sleep disturbance.       Objective:   Physical Exam Constitutional:      General: He is not in acute distress.    Appearance: He is not diaphoretic.  HENT:     Head: Normocephalic and atraumatic.     Right Ear: External ear normal.     Left Ear: External ear normal.     Nose: Nose normal.     Mouth/Throat:     Pharynx: No oropharyngeal exudate.  Eyes:     General: No scleral icterus.    Conjunctiva/sclera: Conjunctivae normal.     Pupils: Pupils are equal, round, and reactive to light.  Neck:     Musculoskeletal: Normal range of motion and neck supple.  Cardiovascular:     Rate and Rhythm: Normal rate and regular rhythm.     Heart sounds: Normal heart sounds. No murmur. No friction rub.  No gallop.   Pulmonary:     Effort: Pulmonary effort is normal. No respiratory distress.     Breath sounds: Normal breath sounds. No wheezing or rales.  Abdominal:     General: Bowel sounds are normal. There is no distension.     Palpations: Abdomen is soft.     Tenderness: There is no abdominal tenderness. There is no rebound.  Musculoskeletal: Normal range of motion.        General: No tenderness.  Lymphadenopathy:     Cervical: No cervical adenopathy.  Skin:    General: Skin is warm and dry.     Coloration: Skin is not pale.     Findings: No erythema or rash.  Neurological:     Mental Status: He is alert and oriented to person, place, and time.     Coordination: Coordination normal.  Psychiatric:        Judgment: Judgment normal.           Assessment & Plan:  MRSA bacteremia:   HIV disease: continue BIKTARVY, check labs today  Lab Results  Component Value Date   HIV1RNAQUANT <20 NOT DETECTED 11/19/2017   HIV1RNAQUANT <20 NOT DETECTED 05/15/2017   HIV1RNAQUANT <20 NOT DETECTED 07/24/2016   Lab Results  Component Value Date   CD4TABS 270 (L) 11/19/2017   CD4TABS 320 (L) 05/15/2017   CD4TABS 350 (L) 07/24/2016

## 2018-07-17 ENCOUNTER — Inpatient Hospital Stay: Payer: Self-pay | Admitting: Infectious Disease

## 2018-07-23 ENCOUNTER — Ambulatory Visit (INDEPENDENT_AMBULATORY_CARE_PROVIDER_SITE_OTHER): Payer: Self-pay | Admitting: Orthopedic Surgery

## 2018-07-23 ENCOUNTER — Encounter (INDEPENDENT_AMBULATORY_CARE_PROVIDER_SITE_OTHER): Payer: Self-pay | Admitting: Orthopedic Surgery

## 2018-07-23 ENCOUNTER — Other Ambulatory Visit: Payer: Self-pay

## 2018-07-23 VITALS — Ht 61.0 in | Wt 121.0 lb

## 2018-07-23 DIAGNOSIS — Z992 Dependence on renal dialysis: Secondary | ICD-10-CM

## 2018-07-23 DIAGNOSIS — N186 End stage renal disease: Secondary | ICD-10-CM

## 2018-07-23 DIAGNOSIS — E11610 Type 2 diabetes mellitus with diabetic neuropathic arthropathy: Secondary | ICD-10-CM | POA: Insufficient documentation

## 2018-07-23 DIAGNOSIS — Z89512 Acquired absence of left leg below knee: Secondary | ICD-10-CM

## 2018-07-23 DIAGNOSIS — E43 Unspecified severe protein-calorie malnutrition: Secondary | ICD-10-CM

## 2018-07-23 NOTE — Progress Notes (Signed)
Office Visit Note   Patient: Alvin Daniels           Date of Birth: [birth year]           MRN: [id number] Visit Date: 07/23/2018              Requested by: [name], MD [place], [place] [id number] PCP: [name], MD  Chief Complaint  Patient presents with  . Right Foot - Follow-up    Right heel ulcer follow up from hospital admission Dr. Sharol Given consulted on pt 06/23/2018      HPI: Patient is a 48 year old gentleman status post left transtibial amputation with an unstable Charcot collapse of the right ankle with a Wegner grade 1 ulcer on the plantar aspect of the right foot.  Patient is currently receiving antibiotics with dialysis he is using a walker.  Patient is currently doing dressing changes for the right foot.  Assessment & Plan: Visit Diagnoses:  1. Acquired absence of left leg below knee ([name])   2. Charcot foot due to diabetes mellitus ([place])     Plan: Patient has had good interval healing of the right foot plantar ulcer.  Continue with dressing changes weightbearing as tolerated patient will follow-up with [facility] on Monday for prosthetic fitting.  Follow-Up Instructions: Return in about 4 weeks (around 08/20/2018).   Ortho Exam  Patient is alert, oriented, no adenopathy, well-dressed, normal affect, normal respiratory effort. Examination patient has a well-healed left transtibial amputation there is no ulcers no odor no drainage no cellulitis no signs of infection.  There is good consolidation of the residual limb.  Examination the right foot he has an unstable Charcot collapse of the ankle and midfoot with no ulcers no cellulitis no redness no signs of active Charcot process.  The plantar ulcer on the right foot has superficial epithelialization no exposed bone or tendon the ulcer is 4 cm in diameter 0.1 mm deep.  Imaging: No results found. No images are attached to the encounter.  Labs: Lab Results  Component Value Date    HGBA1C 9.0 (H) 06/18/2018   HGBA1C 13.4 (H) 11/19/2017   HGBA1C 5.0 05/24/2016   ESRSEDRATE 132 (H) 08/15/2016   ESRSEDRATE 84 (H) 06/10/2014   ESRSEDRATE 104 (H) 04/18/2014   CRP 28.3 (H) 08/15/2016   CRP 17.8 (H) 04/18/2014   CRP 0.13 (L) 01/11/2011   REPTSTATUS 06/24/2018 FINAL 06/19/2018   GRAMSTAIN  04/18/2014    RARE WBC PRESENT, PREDOMINANTLY PMN NO SQUAMOUS EPITHELIAL CELLS SEEN FEW GRAM NEGATIVE RODS Performed at [facility]  06/19/2018    NO GROWTH 5 DAYS Performed at [facility], [place] [address] [place], [place] [address]    LABORGA METHICILLIN RESISTANT STAPHYLOCOCCUS AUREUS 06/17/2018     Lab Results  Component Value Date   ALBUMIN 2.1 (L) 06/23/2018   ALBUMIN 2.3 (L) 06/20/2018   ALBUMIN 2.3 (L) 06/19/2018    Body mass index is 22.86 kg/m.  Orders:  No orders of the defined types were placed in this encounter.  No orders of the defined types were placed in this encounter.    Procedures: No procedures performed  Clinical Data: No additional findings.  ROS:  All other systems negative, except as noted in the HPI. Review of Systems  Objective: Vital Signs: Ht 5\' 1"  (1.549 m)   Wt 121 lb (54.9 kg)   BMI 22.86 kg/m   Specialty Comments:  No specialty comments available.  PMFS History: Patient Active Problem List   Diagnosis Date Noted  . MRSA bacteremia   . Diabetic foot ulcer ([place]) 11/19/2017  . Femur fracture, left ([place]) 08/16/2016  . HIV (human immunodeficiency virus infection) ([place]) 08/16/2016  . GERD (gastroesophageal reflux disease) 05/24/2016  . Burn 04/10/2016  . Pressure ulcer of BKA stump, stage 2 ([place]) 11/22/2014  . Chronic hepatitis C without hepatic coma ([place]) 11/22/2014  . Below knee amputation status 06/17/2014  . HIV disease ([place])   . Poor dentition 10/25/2013  . ESRD on dialysis ([place]) 10/25/2013  . Vision changes 06/21/2013  . Orthostatic headache 04/15/2013  . Anemia of chronic renal  failure 06/19/2012  . Metabolic bone disease 20/35/5974  . Chronic diarrhea 06/17/2012  . Normocytic anemia 06/17/2012  . Polyneuropathy in diabetes(357.2) 06/27/2010  . Diabetes mellitus with ESRD (end-stage renal disease) ([place]) 01/03/2010   Past Medical History:  Diagnosis Date  . AIDS ([place]) 11/22/2014  . Chronic diarrhea   . Chronic hepatitis C without hepatic coma ([place]) 11/22/2014  . Diabetic neuropathy ([place])   . ESRD (end stage renal disease) on dialysis [facility])    "TTS; don't remember street name" (05/03/2014)  . Hepatitis C   . HIV INFECTION 06/27/2010   Qualifier: Diagnosis of  By: Nickola Major CMA ( [place]), Geni Bers    . Hypotension 06/02/2012  . Metabolic bone disease 05/19/3843  . MRSA infection   . Normocytic anemia 06/17/2012  . Pancreatitis   . Pressure ulcer of BKA stump, stage 2 ([place]) 11/22/2014  . Renal disorder   . Severe protein-calorie malnutrition ([place]) 06/17/2012  . Uncontrolled diabetes mellitus with complications ([place]) 3/64/6803   Annotation: uncontrolled Qualifier: Diagnosis of  By: Nickola Major CMA ( AAMA), Geni Bers      Family History  Problem Relation Age of Onset  . Diabetes Mother   . Diabetes Father     Past Surgical History:  Procedure Laterality Date  . AMPUTATION Left 04/20/2014   Procedure: 3rd toe amputation, 4th Toe Amputation,  5th Toe Amputation;  Surgeon: Newt Minion, MD;  Location: [place];  Service: Orthopedics;  Laterality: Left;  . AMPUTATION Left 05/02/2014   Procedure: Midfoot Amputation;  Surgeon: Newt Minion, MD;  Location: [place];  Service: Orthopedics;  Laterality: Left;  . AMPUTATION Left 06/17/2014   Procedure: AMPUTATION BELOW KNEE;  Surgeon: Newt Minion, MD;  Location: [place];  Service: Orthopedics;  Laterality: Left;  . AV FISTULA PLACEMENT Left   . AV FISTULA PLACEMENT Left 05/10/2016   Procedure: Creation Left Arm Brachiocephalic Arteriovenous Fistula and Ligation of Radiocephalic Fistula;  Surgeon: Angelia Mould, MD;   Location: [place];  Service: Vascular;  Laterality: Left;  . FEMUR IM NAIL Left 08/17/2016   Procedure: INTRAMEDULLARY (IM) RETROGRADE FEMORAL NAILING;  Surgeon: Marybelle Killings, MD;  Location: [place];  Service: Orthopedics;  Laterality: Left;  . FOOT AMPUTATION THROUGH ANKLE Left 12/'21/2015   midfoot  . IR GENERIC HISTORICAL Left 05/01/2016   IR THROMBECTOMY AV FISTULA W/THROMBOLYSIS/PTA INC/SHUNT/IMG LEFT 05/01/2016 [name], MD [facility]  . IR GENERIC HISTORICAL  05/01/2016   IR US GUIDE VASC ACCESS LEFT 05/01/2016 [name], MD [facility]  . IR GENERIC HISTORICAL  05/07/2016   IR FLUORO GUIDE CV LINE RIGHT 05/07/2016 Corrie Mckusick, DO [facility]  . IR GENERIC HISTORICAL  05/07/2016   IR US GUIDE VASC ACCESS RIGHT 05/07/2016 Corrie Mckusick, DO [facility]  . IR GENERIC HISTORICAL  05/22/2016   IR US GUIDE VASC  ACCESS RIGHT 05/22/2016 Greggory Keen, MD [facility]  . IR GENERIC HISTORICAL  05/22/2016   IR FLUORO GUIDE CV LINE RIGHT 05/22/2016 Greggory Keen, MD [facility]  . IR REMOVAL TUN CV CATH W/O FL  08/21/2016  . PERIPHERAL VASCULAR CATHETERIZATION Left 05/09/2016   Procedure: A/V Fistulagram;  Surgeon: Angelia Mould, MD;  Location: [place] [facility];  Service: Cardiovascular;  Laterality: Left;  arm  . TEE WITHOUT CARDIOVERSION N/A 06/22/2018   Procedure: TRANSESOPHAGEAL ECHOCARDIOGRAM (TEE);  Surgeon: Jerline Pain, MD;  Location: [facility] ENDOSCOPY;  Service: Cardiovascular;  Laterality: N/A;   Social History   Occupational History  . Not on file  Tobacco Use  . Smoking status: Never Smoker  . Smokeless tobacco: Never Used  Substance and Sexual Activity  . Alcohol use: No  . Drug use: No  . Sexual activity: Never

## 2018-07-28 ENCOUNTER — Ambulatory Visit (INDEPENDENT_AMBULATORY_CARE_PROVIDER_SITE_OTHER): Payer: Self-pay | Admitting: Infectious Disease

## 2018-07-28 ENCOUNTER — Other Ambulatory Visit: Payer: Self-pay

## 2018-07-28 VITALS — Ht 60.0 in | Wt 121.0 lb

## 2018-07-28 DIAGNOSIS — B20 Human immunodeficiency virus [HIV] disease: Secondary | ICD-10-CM

## 2018-07-28 DIAGNOSIS — L97402 Non-pressure chronic ulcer of unspecified heel and midfoot with fat layer exposed: Secondary | ICD-10-CM

## 2018-07-28 DIAGNOSIS — N186 End stage renal disease: Secondary | ICD-10-CM

## 2018-07-28 DIAGNOSIS — E11610 Type 2 diabetes mellitus with diabetic neuropathic arthropathy: Secondary | ICD-10-CM

## 2018-07-28 DIAGNOSIS — E08621 Diabetes mellitus due to underlying condition with foot ulcer: Secondary | ICD-10-CM

## 2018-07-28 DIAGNOSIS — Z992 Dependence on renal dialysis: Secondary | ICD-10-CM

## 2018-07-28 DIAGNOSIS — R7881 Bacteremia: Secondary | ICD-10-CM

## 2018-07-28 NOTE — Progress Notes (Signed)
Chief complaint: followup for HIV, chronic wound recent MRSA bacteremia.  Subjective:    Patient ID: Alvin Daniels, male    DOB: 06-Apr-1971, 48 y.o.   MRN: [id number]  HPI  48 year old with HIV, HCV coinfection, DM, with ESRD on hemodialysis, Diabetic foot infection with gangrene requiring BKA now sp fall with femur fracture.  He continues to be highly adherent to his ARV and is with undetectable viral load.  He has subsequently been changed to [employer].  He was recently mid to the hospital service in February with MRSA bacteremia.  He did not have a dialysis catheter in place and his fistula did not appear overtly infected.  He underwent transesophageal echocardiogram which was negative for endocarditis.  His blood cultures cleared.  He had MRI of his foot which did not show evidence of osteomyelitis he was seen by Meridee Score with orthopedics and is been followed closely by him afterwards in clinic.  He was discharged to complete 2 weeks of vancomycin intravenously with dialysis.  There was a hold-up in him getting antibiotics with dialysis for at least 1 or 2 sessions which greatly disappoints me.  He does appear clinically well however and is finished his antibiotics.  He has had his prosthesis fitted to his left below the knee amputation site though he says it is not fitting as properly as it should.  I have encouraged him to reengage with a [facility] clinic.  His wound has been examined by Dr. Sharol Given feels that is healing well on the right foot.    Past Medical History:  Diagnosis Date  . AIDS ([place]) 11/22/2014  . Chronic diarrhea   . Chronic hepatitis C without hepatic coma ([place]) 11/22/2014  . Diabetic neuropathy ([place])   . ESRD (end stage renal disease) on dialysis [facility])    "TTS; don't remember street name" (05/03/2014)  . Hepatitis C   . HIV INFECTION 06/27/2010   Qualifier: Diagnosis of  By: Nickola Major CMA ( [place]), Geni Bers    . Hypotension 06/02/2012  . Metabolic bone  disease 10/13/3352  . MRSA infection   . Normocytic anemia 06/17/2012  . Pancreatitis   . Pressure ulcer of BKA stump, stage 2 ([place]) 11/22/2014  . Renal disorder   . Severe protein-calorie malnutrition ([place]) 06/17/2012  . Uncontrolled diabetes mellitus with complications ([place]) 5/62/5638   Annotation: uncontrolled Qualifier: Diagnosis of  By: Nickola Major CMA Deborra Medina), Geni Bers      Past Surgical History:  Procedure Laterality Date  . AMPUTATION Left 04/20/2014   Procedure: 3rd toe amputation, 4th Toe Amputation,  5th Toe Amputation;  Surgeon: Newt Minion, MD;  Location: [place];  Service: Orthopedics;  Laterality: Left;  . AMPUTATION Left 05/02/2014   Procedure: Midfoot Amputation;  Surgeon: Newt Minion, MD;  Location: [place];  Service: Orthopedics;  Laterality: Left;  . AMPUTATION Left 06/17/2014   Procedure: AMPUTATION BELOW KNEE;  Surgeon: Newt Minion, MD;  Location: [place];  Service: Orthopedics;  Laterality: Left;  . AV FISTULA PLACEMENT Left   . AV FISTULA PLACEMENT Left 05/10/2016   Procedure: Creation Left Arm Brachiocephalic Arteriovenous Fistula and Ligation of Radiocephalic Fistula;  Surgeon: Angelia Mould, MD;  Location: [place];  Service: Vascular;  Laterality: Left;  . FEMUR IM NAIL Left 08/17/2016   Procedure: INTRAMEDULLARY (IM) RETROGRADE FEMORAL NAILING;  Surgeon: Marybelle Killings, MD;  Location: [place];  Service: Orthopedics;  Laterality: Left;  . FOOT AMPUTATION THROUGH ANKLE Left 12/'21/2015   midfoot  .  IR GENERIC HISTORICAL Left 05/01/2016   IR THROMBECTOMY AV FISTULA W/THROMBOLYSIS/PTA INC/SHUNT/IMG LEFT 05/01/2016 [name], MD [facility]  . IR GENERIC HISTORICAL  05/01/2016   IR US GUIDE VASC ACCESS LEFT 05/01/2016 [name], MD [facility]  . IR GENERIC HISTORICAL  05/07/2016   IR FLUORO GUIDE CV LINE RIGHT 05/07/2016 Corrie Mckusick, DO [facility]  . IR GENERIC HISTORICAL  05/07/2016   IR US GUIDE VASC ACCESS RIGHT 05/07/2016 Corrie Mckusick, DO [facility]  . IR GENERIC HISTORICAL  05/22/2016   IR US GUIDE VASC ACCESS RIGHT 05/22/2016 Greggory Keen, MD [facility]  . IR GENERIC HISTORICAL  05/22/2016   IR FLUORO GUIDE CV LINE RIGHT 05/22/2016 Greggory Keen, MD [facility]  . IR REMOVAL TUN CV CATH W/O FL  08/21/2016  . PERIPHERAL VASCULAR CATHETERIZATION Left 05/09/2016   Procedure: A/V Fistulagram;  Surgeon: Angelia Mould, MD;  Location: [place] [facility];  Service: Cardiovascular;  Laterality: Left;  arm  . TEE WITHOUT CARDIOVERSION N/A 06/22/2018   Procedure: TRANSESOPHAGEAL ECHOCARDIOGRAM (TEE);  Surgeon: Jerline Pain, MD;  Location: [facility] ENDOSCOPY;  Service: Cardiovascular;  Laterality: N/A;    Family History  Problem Relation Age of Onset  . Diabetes Mother   . Diabetes Father       Social History   Socioeconomic History  . Marital status: Single    Spouse name: Not on file  . Number of children: Not on file  . Years of education: Not on file  . Highest education level: Not on file  Occupational History  . Not on file  Social Needs  . Financial resource strain: Not on file  . Food insecurity:    Worry: Not on file    Inability: Not on file  . Transportation needs:    Medical: Not on file    Non-medical: Not on file  Tobacco Use  . Smoking status: Never Smoker  . Smokeless tobacco: Never Used  Substance and Sexual Activity  . Alcohol use: No  . Drug use: No  . Sexual activity: Never  Lifestyle  . Physical activity:    Days per week: Not on file    Minutes per session: Not on file  . Stress: Not on file  Relationships  . Social connections:    Talks on phone: Not on file    Gets together: Not on file    Attends religious service: Not on file    Active member of club or organization: Not on file    Attends meetings of clubs or organizations: Not on file    Relationship status: Not on file  Other Topics Concern  . Not on file  Social History Narrative   ** Merged History Encounter **         No Known Allergies   Current Outpatient Medications:  .  acetaminophen (TYLENOL) 325 MG tablet, Take 2 tablets (650 mg total) by mouth every 6 (six) hours as needed for mild pain., Disp: , Rfl:  .  bictegravir-emtricitabine-tenofovir AF (BIKTARVY) 50-200-25 MG TABS tablet, Take 1 tablet by mouth daily., Disp: 30 tablet, Rfl: 11 .  insulin NPH-regular Human (70-30) 100 UNIT/ML injection, Inject into the skin. Inject 15 units subcutaneous in the a.m. and 10 units subcutaneous in the p.m. with meals, Disp: , Rfl:  .  vancomycin (VANCOCIN) 500-5 MG/100ML-% IVPB, Inject 100 mLs (500 mg total) into the vein Every Tuesday,Thursday,and Saturday with dialysis. End date: March 19th 2020. Check CBC w/ differential, ESR,  CRP weekly, Disp: , Rfl:     Review of Systems  Constitutional: Negative for activity change, appetite change, chills, diaphoresis, fatigue, fever and unexpected weight change.  HENT: Negative for congestion, rhinorrhea, sinus pressure, sneezing, sore throat and trouble swallowing.   Eyes: Negative for photophobia and visual disturbance.  Respiratory: Negative for cough, chest tightness, shortness of breath, wheezing and stridor.   Cardiovascular: Negative for chest pain, palpitations and leg swelling.  Gastrointestinal: Negative for abdominal distention, abdominal pain, anal bleeding, blood in stool, constipation, diarrhea, nausea and vomiting.  Genitourinary: Negative for difficulty urinating, dysuria, flank pain and hematuria.  Musculoskeletal: Negative for arthralgias, back pain, gait problem, joint swelling and myalgias.  Skin: Positive for wound. Negative for color change, pallor and rash.  Neurological: Negative for dizziness, tremors, weakness and light-headedness.  Hematological: Negative for adenopathy. Does not bruise/bleed easily.  Psychiatric/Behavioral: Negative for agitation, behavioral problems, confusion, decreased concentration, dysphoric mood and sleep disturbance.        Objective:   Physical Exam  Constitutional: He is oriented to person, place, and time.  HENT:  Head: Normocephalic and atraumatic.  Eyes: Conjunctivae and EOM are normal.  Neck: Normal range of motion. Neck supple.  Cardiovascular: Normal rate and regular rhythm.  Pulmonary/Chest: Effort normal. No respiratory distress. He has no wheezes.  Abdominal: Soft. He exhibits no distension.  Musculoskeletal: Normal range of motion.        General: No tenderness or edema.  Neurological: He is alert and oriented to person, place, and time.  Skin: Skin is warm.  Psychiatric: He has a normal mood and affect. His behavior is normal. Judgment and thought content normal.    Right foot July 28, 2018:                  Assessment & Plan:   MRSA bacteremia: Source was likely through skin.  Is really at risk being a dialysis patient.  We did not find evidence of deep infection he completed 2 weeks of therapy.  We will check surveillance blood cultures on him today.  Diabetic foot ulcer: He will continue follow-up with Dr. Sharol Given.  Below the knee amputation on the left: He should go back to the [facility] to ensure that the prosthesis is fitting well.  HIV/AIDS: Continue Biktarvy and HMA P is renewed he will need to come back in the summer in July or August to renew again unless HMA P has more progressive thinking in light of the coronavirus.  CHronic hepatitis C without coma:cured  I spent greater than 25 minutes with the patient including greater than 50% of time in face to face counsel of the patient his recent bloodstream infection how we are going to check surveillance cultures with [place] regarding his HIV and excellent adherence to medications and in coordination of his care.

## 2018-08-04 LAB — CULTURE BLOOD MANUAL
Micro Number: 332507
Micro Number: 332522
Result: NO GROWTH
Result: NO GROWTH
Specimen Quality: ADEQUATE

## 2018-08-12 ENCOUNTER — Ambulatory Visit: Payer: Self-pay | Admitting: Internal Medicine

## 2018-08-20 ENCOUNTER — Ambulatory Visit (INDEPENDENT_AMBULATORY_CARE_PROVIDER_SITE_OTHER): Payer: Self-pay | Admitting: Orthopedic Surgery

## 2018-08-20 ENCOUNTER — Ambulatory Visit (INDEPENDENT_AMBULATORY_CARE_PROVIDER_SITE_OTHER): Payer: Self-pay | Admitting: Physician Assistant

## 2018-08-20 ENCOUNTER — Encounter (INDEPENDENT_AMBULATORY_CARE_PROVIDER_SITE_OTHER): Payer: Self-pay | Admitting: Orthopedic Surgery

## 2018-08-20 ENCOUNTER — Other Ambulatory Visit: Payer: Self-pay

## 2018-08-20 VITALS — Ht 60.0 in | Wt 121.0 lb

## 2018-08-20 DIAGNOSIS — E43 Unspecified severe protein-calorie malnutrition: Secondary | ICD-10-CM

## 2018-08-20 DIAGNOSIS — Z794 Long term (current) use of insulin: Secondary | ICD-10-CM

## 2018-08-20 DIAGNOSIS — N186 End stage renal disease: Secondary | ICD-10-CM

## 2018-08-20 DIAGNOSIS — E1142 Type 2 diabetes mellitus with diabetic polyneuropathy: Secondary | ICD-10-CM

## 2018-08-20 DIAGNOSIS — E10621 Type 1 diabetes mellitus with foot ulcer: Secondary | ICD-10-CM

## 2018-08-20 DIAGNOSIS — L97418 Non-pressure chronic ulcer of right heel and midfoot with other specified severity: Secondary | ICD-10-CM

## 2018-08-20 DIAGNOSIS — Z89512 Acquired absence of left leg below knee: Secondary | ICD-10-CM

## 2018-08-20 DIAGNOSIS — E1122 Type 2 diabetes mellitus with diabetic chronic kidney disease: Secondary | ICD-10-CM

## 2018-08-20 DIAGNOSIS — E11610 Type 2 diabetes mellitus with diabetic neuropathic arthropathy: Secondary | ICD-10-CM

## 2018-08-20 NOTE — Progress Notes (Signed)
Office Visit Note   Patient: Alvin Daniels           Date of Birth: March 21, 1971           MRN: [id number] Visit Date: 08/20/2018              Requested by: [name], MD [place], [place] [address] PCP: [name], MD  Chief Complaint  Patient presents with  . Right Foot - Follow-up      HPI: The patient is a 48 year old gentleman who is seen for follow-up of his right heel ulcer.  He has a previous left below the knee amputation and ambulates with a prosthetic made by [facility].  He has severe Charcot deformity of his right foot as well as new breakdown over the right heel.  He is diabetic and has end-stage renal disease and is on hemodialysis.  He is been using Silvadene cream to the area.  He does walk in a regular shoe.  He notices that his right ankle swells more prior to having dialysis but that it improves once he is dialyzed.  It recurs however on the intervening days.  He is seen today with the assistance of a Spanish interpreter.  Assessment & Plan: Visit Diagnoses:  1. Diabetic ulcer of right heel associated with type 1 diabetes mellitus, with other ulcer severity ([place])   2. Charcot's arthropathy associated with type 2 diabetes mellitus ([place])   3. Diabetes mellitus with ESRD (end-stage renal disease) ([place])   4. Type 2 diabetes mellitus with diabetic polyneuropathy, with long-term current use of insulin ([place])   5. Acquired absence of left leg below knee ([name])   6. Severe protein-calorie malnutrition ([place])     Plan: Continue to wash the right heel daily and Dial soap and water and then apply Silvadene cream to the area.  Continue to try to offload and elevate the right foot whenever possible.  Continue to utilize a walker for ambulation.  Continue to work with [facility] for adjustment of your left transtibial prosthetic.  Follow-up in 3 weeks or sooner if difficulties in the interim.  Follow-Up Instructions: Return in about 3 weeks  (around 09/10/2018).   Ortho Exam  Patient is alert, oriented, no adenopathy, well-dressed, normal affect, normal respiratory effort. Patient is ambulating with a left transtibial prosthetic and has no signs of infection here.  Examination of the right foot shows unstable Charcot collapse of the ankle and the midfoot.  He does have a plantar ulcer over the right heel with no exposed bone or tendon.  The ulcer is approximately 1.5 x 3 x 0.1 cm and without evidence of cellulitis of the foot currently.  There is localized edema about the ankle today.  He has palpable pedal pulses.  Imaging: No results found.   Labs: Lab Results  Component Value Date   HGBA1C 9.0 (H) 06/18/2018   HGBA1C 13.4 (H) 11/19/2017   HGBA1C 5.0 05/24/2016   ESRSEDRATE 132 (H) 08/15/2016   ESRSEDRATE 84 (H) 06/10/2014   ESRSEDRATE 104 (H) 04/18/2014   CRP 28.3 (H) 08/15/2016   CRP 17.8 (H) 04/18/2014   CRP 0.13 (L) 01/11/2011   REPTSTATUS 06/24/2018 FINAL 06/19/2018   GRAMSTAIN  04/18/2014    RARE WBC PRESENT, PREDOMINANTLY PMN NO SQUAMOUS EPITHELIAL CELLS SEEN FEW GRAM NEGATIVE RODS Performed at [facility]  06/19/2018    NO GROWTH 5 DAYS Performed at [facility], [place] [address]  [place], [place] [address]    LABORGA METHICILLIN RESISTANT STAPHYLOCOCCUS AUREUS 06/17/2018     Lab Results  Component Value Date   ALBUMIN 2.1 (L) 06/23/2018   ALBUMIN 2.3 (L) 06/20/2018   ALBUMIN 2.3 (L) 06/19/2018    Body mass index is 23.63 kg/m.  Orders:  No orders of the defined types were placed in this encounter.  No orders of the defined types were placed in this encounter.    Procedures: No procedures performed  Clinical Data: No additional findings.  ROS:  All other systems negative, except as noted in the HPI. Review of Systems  Objective: Vital Signs: Ht 5' (1.524 m)   Wt 121 lb (54.9 kg)   BMI 23.63 kg/m   Specialty Comments:  No specialty comments available.   PMFS History: Patient Active Problem List   Diagnosis Date Noted  . Charcot foot due to diabetes mellitus ([place]) 07/23/2018  . MRSA bacteremia   . Diabetic foot ulcer ([place]) 11/19/2017  . Femur fracture, left ([place]) 08/16/2016  . HIV (human immunodeficiency virus infection) ([place]) 08/16/2016  . GERD (gastroesophageal reflux disease) 05/24/2016  . Burn 04/10/2016  . Pressure ulcer of BKA stump, stage 2 ([place]) 11/22/2014  . Chronic hepatitis C without hepatic coma ([place]) 11/22/2014  . Acquired absence of left leg below knee ([place]) 06/17/2014  . HIV disease ([place])   . Poor dentition 10/25/2013  . ESRD on dialysis ([place]) 10/25/2013  . Vision changes 06/21/2013  . Orthostatic headache 04/15/2013  . Anemia of chronic renal failure 06/19/2012  . Metabolic bone disease 93/81/0175  . Chronic diarrhea 06/17/2012  . Severe protein-calorie malnutrition ([place]) 06/17/2012  . Normocytic anemia 06/17/2012  . Polyneuropathy in diabetes(357.2) 06/27/2010  . Diabetes mellitus with ESRD (end-stage renal disease) ([place]) 01/03/2010   Past Medical History:  Diagnosis Date  . AIDS ([place]) 11/22/2014  . Chronic diarrhea   . Chronic hepatitis C without hepatic coma ([place]) 11/22/2014  . Diabetic neuropathy ([place])   . ESRD (end stage renal disease) on dialysis [facility])    "TTS; don't remember street name" (05/03/2014)  . Hepatitis C   . HIV INFECTION 06/27/2010   Qualifier: Diagnosis of  By: Nickola Major CMA ( [place]), Geni Bers    . Hypotension 06/02/2012  . Metabolic bone disease 1/0/2585  . MRSA infection   . Normocytic anemia 06/17/2012  . Pancreatitis   . Pressure ulcer of BKA stump, stage 2 ([place]) 11/22/2014  . Renal disorder   . Severe protein-calorie malnutrition ([place]) 06/17/2012  . Uncontrolled diabetes mellitus with complications ([place]) 2/77/8242   Annotation: uncontrolled Qualifier: Diagnosis of  By: Nickola Major CMA ( AAMA), Geni Bers      Family History  Problem Relation Age of Onset  . Diabetes Mother   .  Diabetes Father     Past Surgical History:  Procedure Laterality Date  . AMPUTATION Left 04/20/2014   Procedure: 3rd toe amputation, 4th Toe Amputation,  5th Toe Amputation;  Surgeon: Newt Minion, MD;  Location: [facility];  Service: Orthopedics;  Laterality: Left;  . AMPUTATION Left 05/02/2014   Procedure: Midfoot Amputation;  Surgeon: Newt Minion, MD;  Location: [facility];  Service: Orthopedics;  Laterality: Left;  . AMPUTATION Left 06/17/2014   Procedure: AMPUTATION BELOW KNEE;  Surgeon: Newt Minion, MD;  Location: [facility];  Service: Orthopedics;  Laterality: Left;  . AV FISTULA PLACEMENT Left   . AV FISTULA PLACEMENT Left 05/10/2016   Procedure: Creation Left Arm Brachiocephalic Arteriovenous Fistula and Ligation of Radiocephalic Fistula;  Surgeon: Angelia Mould, MD;  Location: [facility];  Service: Vascular;  Laterality: Left;  . FEMUR IM NAIL Left 08/17/2016   Procedure: INTRAMEDULLARY (IM) RETROGRADE FEMORAL NAILING;  Surgeon: Marybelle Killings, MD;  Location: [facility];  Service: Orthopedics;  Laterality: Left;  . FOOT AMPUTATION THROUGH ANKLE Left 12/'21/2015   midfoot  . IR GENERIC HISTORICAL Left 05/01/2016   IR THROMBECTOMY AV FISTULA W/THROMBOLYSIS/PTA INC/SHUNT/IMG LEFT 05/01/2016 [name], MD [facility]  . IR GENERIC HISTORICAL  05/01/2016   IR US GUIDE VASC ACCESS LEFT 05/01/2016 [name], MD [facility]  . IR GENERIC HISTORICAL  05/07/2016   IR FLUORO GUIDE CV LINE RIGHT 05/07/2016 Corrie Mckusick, DO [facility]  . IR GENERIC HISTORICAL  05/07/2016   IR US GUIDE VASC ACCESS RIGHT 05/07/2016 Corrie Mckusick, DO [facility]  . IR GENERIC HISTORICAL  05/22/2016   IR US GUIDE VASC ACCESS RIGHT 05/22/2016 Greggory Keen, MD [facility]  . IR GENERIC HISTORICAL  05/22/2016   IR FLUORO GUIDE CV LINE RIGHT 05/22/2016 Greggory Keen, MD [facility]  . IR REMOVAL TUN CV CATH W/O FL  08/21/2016  . PERIPHERAL VASCULAR CATHETERIZATION Left 05/09/2016   Procedure: A/V Fistulagram;   Surgeon: Angelia Mould, MD;  Location: [facility];  Service: Cardiovascular;  Laterality: Left;  arm  . TEE WITHOUT CARDIOVERSION N/A 06/22/2018   Procedure: TRANSESOPHAGEAL ECHOCARDIOGRAM (TEE);  Surgeon: Jerline Pain, MD;  Location: [facility] ENDOSCOPY;  Service: Cardiovascular;  Laterality: N/A;   Social History   Occupational History  . Not on file  Tobacco Use  . Smoking status: Never Smoker  . Smokeless tobacco: Never Used  Substance and Sexual Activity  . Alcohol use: No  . Drug use: No  . Sexual activity: Never

## 2018-08-23 ENCOUNTER — Encounter (INDEPENDENT_AMBULATORY_CARE_PROVIDER_SITE_OTHER): Payer: Self-pay | Admitting: Physician Assistant

## 2018-08-27 ENCOUNTER — Encounter (HOSPITAL_COMMUNITY): Payer: Self-pay

## 2018-08-27 ENCOUNTER — Emergency Department (HOSPITAL_COMMUNITY): Payer: Medicaid Other

## 2018-08-27 ENCOUNTER — Emergency Department (HOSPITAL_COMMUNITY)
Admission: EM | Admit: 2018-08-27 | Discharge: 2018-08-27 | Disposition: A | Discharge status: Home / Self Care | Payer: Medicaid Other | Attending: Emergency Medicine | Admitting: Emergency Medicine

## 2018-08-27 ENCOUNTER — Other Ambulatory Visit: Payer: Self-pay

## 2018-08-27 DIAGNOSIS — Z79899 Other long term (current) drug therapy: Secondary | ICD-10-CM | POA: Insufficient documentation

## 2018-08-27 DIAGNOSIS — N186 End stage renal disease: Secondary | ICD-10-CM | POA: Insufficient documentation

## 2018-08-27 DIAGNOSIS — E119 Type 2 diabetes mellitus without complications: Secondary | ICD-10-CM | POA: Diagnosis not present

## 2018-08-27 DIAGNOSIS — R509 Fever, unspecified: Secondary | ICD-10-CM | POA: Diagnosis not present

## 2018-08-27 DIAGNOSIS — Z794 Long term (current) use of insulin: Secondary | ICD-10-CM | POA: Diagnosis not present

## 2018-08-27 LAB — COMPREHENSIVE METABOLIC PANEL
ALT: 9 U/L (ref 0–44)
AST: 15 U/L (ref 15–41)
Albumin: 2.5 g/dL — ABNORMAL LOW (ref 3.5–5.0)
Alkaline Phosphatase: 80 U/L (ref 38–126)
Anion gap: 17 — ABNORMAL HIGH (ref 5–15)
BUN: 45 mg/dL — ABNORMAL HIGH (ref 6–20)
CO2: 21 mmol/L — ABNORMAL LOW (ref 22–32)
Calcium: 8.8 mg/dL — ABNORMAL LOW (ref 8.9–10.3)
Chloride: 94 mmol/L — ABNORMAL LOW (ref 98–111)
Creatinine, Ser: 10.89 mg/dL — ABNORMAL HIGH (ref 0.61–1.24)
GFR calc Af Amer: 6 mL/min — ABNORMAL LOW (ref >60)
GFR calc non Af Amer: 5 mL/min — ABNORMAL LOW (ref >60)
Glucose, Bld: 297 mg/dL — ABNORMAL HIGH (ref 70–99)
Potassium: 4.1 mmol/L (ref 3.5–5.1)
Sodium: 132 mmol/L — ABNORMAL LOW (ref 135–145)
Total Bilirubin: 0.7 mg/dL (ref 0.3–1.2)
Total Protein: 9 g/dL — ABNORMAL HIGH (ref 6.5–8.1)

## 2018-08-27 LAB — CBC WITH DIFFERENTIAL/PLATELET
Abs Immature Granulocytes: 0.02 10*3/uL (ref 0.00–0.07)
Basophils Absolute: 0 10*3/uL (ref 0.0–0.1)
Basophils Relative: 1 %
Eosinophils Absolute: 0.2 10*3/uL (ref 0.0–0.5)
Eosinophils Relative: 3 %
HCT: 29.7 % — ABNORMAL LOW (ref 39.0–52.0)
Hemoglobin: 9 g/dL — ABNORMAL LOW (ref 13.0–17.0)
Immature Granulocytes: 0 %
Lymphocytes Relative: 19 %
Lymphs Abs: 1.2 10*3/uL (ref 0.7–4.0)
MCH: 28.8 pg (ref 26.0–34.0)
MCHC: 30.3 g/dL (ref 30.0–36.0)
MCV: 95.2 fL (ref 80.0–100.0)
Monocytes Absolute: 0.7 10*3/uL (ref 0.1–1.0)
Monocytes Relative: 12 %
Neutro Abs: 3.9 10*3/uL (ref 1.7–7.7)
Neutrophils Relative %: 65 %
Platelets: 246 10*3/uL (ref 150–400)
RBC: 3.12 MIL/uL — ABNORMAL LOW (ref 4.22–5.81)
RDW: 14.5 % (ref 11.5–15.5)
WBC: 6 10*3/uL (ref 4.0–10.5)
nRBC: 0 % (ref 0.0–0.2)

## 2018-08-27 LAB — LACTIC ACID, PLASMA: Lactic Acid, Venous: 0.7 mmol/L (ref 0.5–1.9)

## 2018-08-27 NOTE — ED Triage Notes (Signed)
Pt arrived via [facility]; pt from dialysis as they wld not perform dialysis due to patient having temp of 99.8. Pt speaks spanish and per EMS, pt stated he feels fine, he had his coffee before going to dialysis. 140/80; 100; CBG 299; 18; 100% on RA temp 98.6 by EMS

## 2018-08-27 NOTE — ED Notes (Signed)
Xray tech at bedside.

## 2018-08-27 NOTE — ED Notes (Signed)
Patient verbalizes understanding of discharge instructions. Opportunity for questioning and answers were provided. Armband removed by staff, pt discharged from ED.  

## 2018-08-27 NOTE — ED Notes (Signed)
Report given to Tiffany at [facility], [place]; pt cleared for dialysis per Dr. Vanita Panda. Pt given cab voucher to go to dialysis.

## 2018-08-27 NOTE — ED Provider Notes (Signed)
[facility] EMERGENCY DEPARTMENT Provider Note   CSN: [id number] Arrival date & time: 08/27/18  0701    History   Chief Complaint Chief Complaint  Patient presents with  . Fever    HPI Alvin Daniels is a 48 y.o. male.     HPI  Patient presents with concern of fever. The patient self actually states that he feels good, denies complaints including cough, nausea, fever on a thermometer at home. No recent illness. Acknowledges multiple medical issues, but again states that he has been doing generally well. However, today the patient went to dialysis.  He notes that he did drink a couple coffee, both last night and prior to arrival. There he was found to have a temperature of 99.7.  He was sent here for evaluation.   Past Medical History:  Diagnosis Date  . AIDS ([place]) 11/22/2014  . Chronic diarrhea   . Chronic hepatitis C without hepatic coma ([place]) 11/22/2014  . Diabetic neuropathy ([place])   . ESRD (end stage renal disease) on dialysis [facility])    "TTS; don't remember street name" (05/03/2014)  . Hepatitis C   . HIV INFECTION 06/27/2010   Qualifier: Diagnosis of  By: Nickola Major CMA ( [place]), Geni Bers    . Hypotension 06/02/2012  . Metabolic bone disease 0/06/5850  . MRSA infection   . Normocytic anemia 06/17/2012  . Pancreatitis   . Pressure ulcer of BKA stump, stage 2 ([place]) 11/22/2014  . Renal disorder   . Severe protein-calorie malnutrition ([place]) 06/17/2012  . Uncontrolled diabetes mellitus with complications ([place]) 7/78/2423   Annotation: uncontrolled Qualifier: Diagnosis of  By: Nickola Major CMA Deborra Medina), Jacqueline      Patient Active Problem List   Diagnosis Date Noted  . Charcot foot due to diabetes mellitus ([place]) 07/23/2018  . MRSA bacteremia   . Diabetic foot ulcer ([place]) 11/19/2017  . Femur fracture, left ([place]) 08/16/2016  . HIV (human immunodeficiency virus infection) ([place]) 08/16/2016  . GERD (gastroesophageal reflux disease) 05/24/2016  . Burn  04/10/2016  . Pressure ulcer of BKA stump, stage 2 ([place]) 11/22/2014  . Chronic hepatitis C without hepatic coma ([place]) 11/22/2014  . Acquired absence of left leg below knee ([place]) 06/17/2014  . HIV disease ([place])   . Poor dentition 10/25/2013  . ESRD on dialysis ([place]) 10/25/2013  . Vision changes 06/21/2013  . Orthostatic headache 04/15/2013  . Anemia of chronic renal failure 06/19/2012  . Metabolic bone disease 53/61/4431  . Chronic diarrhea 06/17/2012  . Severe protein-calorie malnutrition ([place]) 06/17/2012  . Normocytic anemia 06/17/2012  . Polyneuropathy in diabetes(357.2) 06/27/2010  . Diabetes mellitus with ESRD (end-stage renal disease) ([place]) 01/03/2010    Past Surgical History:  Procedure Laterality Date  . AMPUTATION Left 04/20/2014   Procedure: 3rd toe amputation, 4th Toe Amputation,  5th Toe Amputation;  Surgeon: Newt Minion, MD;  Location: [facility];  Service: Orthopedics;  Laterality: Left;  . AMPUTATION Left 05/02/2014   Procedure: Midfoot Amputation;  Surgeon: Newt Minion, MD;  Location: [facility];  Service: Orthopedics;  Laterality: Left;  . AMPUTATION Left 06/17/2014   Procedure: AMPUTATION BELOW KNEE;  Surgeon: Newt Minion, MD;  Location: [facility];  Service: Orthopedics;  Laterality: Left;  . AV FISTULA PLACEMENT Left   . AV FISTULA PLACEMENT Left 05/10/2016   Procedure: Creation Left Arm Brachiocephalic Arteriovenous Fistula and Ligation of Radiocephalic Fistula;  Surgeon: Angelia Mould, MD;  Location: [facility];  Service: Vascular;  Laterality: Left;  . FEMUR  IM NAIL Left 08/17/2016   Procedure: INTRAMEDULLARY (IM) RETROGRADE FEMORAL NAILING;  Surgeon: Marybelle Killings, MD;  Location: [facility];  Service: Orthopedics;  Laterality: Left;  . FOOT AMPUTATION THROUGH ANKLE Left 12/'21/2015   midfoot  . IR GENERIC HISTORICAL Left 05/01/2016   IR THROMBECTOMY AV FISTULA W/THROMBOLYSIS/PTA INC/SHUNT/IMG LEFT 05/01/2016 [name], MD [facility]  . IR GENERIC HISTORICAL   05/01/2016   IR US GUIDE VASC ACCESS LEFT 05/01/2016 [name], MD [facility]  . IR GENERIC HISTORICAL  05/07/2016   IR FLUORO GUIDE CV LINE RIGHT 05/07/2016 Corrie Mckusick, DO [facility]  . IR GENERIC HISTORICAL  05/07/2016   IR US GUIDE VASC ACCESS RIGHT 05/07/2016 Corrie Mckusick, DO [facility]  . IR GENERIC HISTORICAL  05/22/2016   IR US GUIDE VASC ACCESS RIGHT 05/22/2016 Greggory Keen, MD [facility]  . IR GENERIC HISTORICAL  05/22/2016   IR FLUORO GUIDE CV LINE RIGHT 05/22/2016 Greggory Keen, MD [facility]  . IR REMOVAL TUN CV CATH W/O FL  08/21/2016  . PERIPHERAL VASCULAR CATHETERIZATION Left 05/09/2016   Procedure: A/V Fistulagram;  Surgeon: Angelia Mould, MD;  Location: [facility];  Service: Cardiovascular;  Laterality: Left;  arm  . TEE WITHOUT CARDIOVERSION N/A 06/22/2018   Procedure: TRANSESOPHAGEAL ECHOCARDIOGRAM (TEE);  Surgeon: Jerline Pain, MD;  Location: [facility] ENDOSCOPY;  Service: Cardiovascular;  Laterality: N/A;        Home Medications    Prior to Admission medications   Medication Sig Start Date End Date Taking? Authorizing Provider  acetaminophen (TYLENOL) 325 MG tablet Take 2 tablets (650 mg total) by mouth every 6 (six) hours as needed for mild pain. 06/23/18   Mariel Aloe, MD  bictegravir-emtricitabine-tenofovir AF (BIKTARVY) 50-200-25 MG TABS tablet Take 1 tablet by mouth daily. 11/19/17   Comer, Okey Regal, MD  insulin NPH-regular Human (70-30) 100 UNIT/ML injection Inject into the skin. Inject 15 units subcutaneous in the a.m. and 10 units subcutaneous in the p.m. with meals    [provider]    Family History Family History  Problem Relation Age of Onset  . Diabetes Mother   . Diabetes Father     Social History Social History   Tobacco Use  . Smoking status: Never Smoker  . Smokeless tobacco: Never Used  Substance Use Topics  . Alcohol use: No  . Drug use: No     Allergies   Patient has no known allergies.    Review of Systems Review of Systems  Constitutional:       Per HPI, otherwise negative  HENT:       Per HPI, otherwise negative  Respiratory:       Per HPI, otherwise negative  Cardiovascular:       Per HPI, otherwise negative  Gastrointestinal: Negative for vomiting.  Endocrine:       Negative aside from HPI  Genitourinary:       Neg aside from HPI   Musculoskeletal:       Per HPI, otherwise negative  Skin: Negative.   Allergic/Immunologic: Positive for immunocompromised state.  Neurological: Negative for syncope.     Physical Exam Updated Vital Signs Temp 98.7 F (37.1 C) (Oral)   Physical Exam Vitals signs and nursing note reviewed.  Constitutional:      General: He is not in acute distress.    Appearance: He is well-developed.  HENT:     Head: Normocephalic and atraumatic.  Eyes:     Conjunctiva/sclera:  Conjunctivae normal.  Cardiovascular:     Rate and Rhythm: Normal rate and regular rhythm.  Pulmonary:     Effort: Pulmonary effort is normal. No respiratory distress.     Breath sounds: No stridor.  Abdominal:     General: There is no distension.  Musculoskeletal:     Comments: L BKA  Skin:    General: Skin is warm and dry.  Neurological:     Mental Status: He is alert and oriented to person, place, and time.      ED Treatments / Results  Labs (all labs ordered are listed, but only abnormal results are displayed) Labs Reviewed  COMPREHENSIVE METABOLIC PANEL - Abnormal; Notable for the following components:      Result Value   Sodium 132 (*)    Chloride 94 (*)    CO2 21 (*)    Glucose, Bld 297 (*)    BUN 45 (*)    Creatinine, Ser 10.89 (*)    Calcium 8.8 (*)    Total Protein 9.0 (*)    Albumin 2.5 (*)    GFR calc non Af Amer 5 (*)    GFR calc Af Amer 6 (*)    Anion gap 17 (*)    All other components within normal limits  CBC WITH DIFFERENTIAL/PLATELET - Abnormal; Notable for the following components:   RBC 3.12 (*)    Hemoglobin 9.0 (*)     HCT 29.7 (*)    All other components within normal limits  LACTIC ACID, PLASMA  LACTIC ACID, PLASMA    Radiology Dg Chest Port 1 View  Result Date: 08/27/2018 CLINICAL DATA:  Fever. EXAM: PORTABLE CHEST 1 VIEW COMPARISON:  Radiographs of June 16, 2018. FINDINGS: The heart size and mediastinal contours are within normal limits. Both lungs are clear. Old left clavicular fracture is noted. IMPRESSION: No active disease. Electronically Signed   By: Marijo Conception M.D.   On: 08/27/2018 08:05    Procedures Procedures (including critical care time)    Initial Impression / Assessment and Plan / ED Course  I have reviewed the triage vital signs and the nursing notes.  Pertinent labs & imaging results that were available during my care of the patient were reviewed by me and considered in my medical decision making (see chart for details).    On repeat exam the patient is in no distress.  This well-appearing male, though with multiple noted medical issues presents with concern of fever on request of dialysis staff. Here the patient is awake, alert, without complaints.  Patient acknowledges drinking coffee prior to going to dialysis and is making explain his oral temperature there, as here he has no fever, no distress, no complaints, no abnormal x-ray findings, reassuring labs aside from demonstration of need for ongoing dialysis. Patient discharged in stable condition.   Final Clinical Impressions(s) / ED Diagnoses  Fever in adult patient   Carmin Muskrat, MD 08/27/18 [id number]

## 2018-08-27 NOTE — Discharge Instructions (Signed)
As discussed, your evaluation today has been largely reassuring.  But, it is important that you monitor your condition carefully, and do not hesitate to return to the ED if you develop new, or concerning changes in your condition. ? ?Otherwise, please follow-up with your physician for appropriate ongoing care. ? ?

## 2018-08-29 ENCOUNTER — Emergency Department (HOSPITAL_COMMUNITY): Payer: Medicaid Other

## 2018-08-29 ENCOUNTER — Other Ambulatory Visit: Payer: Self-pay

## 2018-08-29 ENCOUNTER — Inpatient Hospital Stay (HOSPITAL_COMMUNITY)
Admission: EM | Admit: 2018-08-29 | Discharge: 2018-09-07 | DRG: 969 | Disposition: A | Discharge status: Home Health | Payer: Medicaid Other | Attending: Internal Medicine | Admitting: Internal Medicine

## 2018-08-29 DIAGNOSIS — Z89512 Acquired absence of left leg below knee: Secondary | ICD-10-CM

## 2018-08-29 DIAGNOSIS — M869 Osteomyelitis, unspecified: Secondary | ICD-10-CM | POA: Diagnosis present

## 2018-08-29 DIAGNOSIS — D631 Anemia in chronic kidney disease: Secondary | ICD-10-CM | POA: Diagnosis present

## 2018-08-29 DIAGNOSIS — M898X9 Other specified disorders of bone, unspecified site: Secondary | ICD-10-CM | POA: Diagnosis present

## 2018-08-29 DIAGNOSIS — E1140 Type 2 diabetes mellitus with diabetic neuropathy, unspecified: Secondary | ICD-10-CM | POA: Diagnosis present

## 2018-08-29 DIAGNOSIS — B182 Chronic viral hepatitis C: Secondary | ICD-10-CM | POA: Diagnosis present

## 2018-08-29 DIAGNOSIS — E1122 Type 2 diabetes mellitus with diabetic chronic kidney disease: Secondary | ICD-10-CM | POA: Diagnosis present

## 2018-08-29 DIAGNOSIS — I9589 Other hypotension: Secondary | ICD-10-CM | POA: Diagnosis present

## 2018-08-29 DIAGNOSIS — R7881 Bacteremia: Secondary | ICD-10-CM | POA: Diagnosis present

## 2018-08-29 DIAGNOSIS — L02419 Cutaneous abscess of limb, unspecified: Secondary | ICD-10-CM | POA: Diagnosis present

## 2018-08-29 DIAGNOSIS — B20 Human immunodeficiency virus [HIV] disease: Secondary | ICD-10-CM | POA: Diagnosis present

## 2018-08-29 DIAGNOSIS — D72829 Elevated white blood cell count, unspecified: Secondary | ICD-10-CM

## 2018-08-29 DIAGNOSIS — I120 Hypertensive chronic kidney disease with stage 5 chronic kidney disease or end stage renal disease: Secondary | ICD-10-CM | POA: Diagnosis present

## 2018-08-29 DIAGNOSIS — E1169 Type 2 diabetes mellitus with other specified complication: Secondary | ICD-10-CM | POA: Diagnosis present

## 2018-08-29 DIAGNOSIS — R000 Tachycardia, unspecified: Secondary | ICD-10-CM

## 2018-08-29 DIAGNOSIS — A4102 Sepsis due to Methicillin resistant Staphylococcus aureus: Principal | ICD-10-CM | POA: Diagnosis present

## 2018-08-29 DIAGNOSIS — K219 Gastro-esophageal reflux disease without esophagitis: Secondary | ICD-10-CM | POA: Diagnosis present

## 2018-08-29 DIAGNOSIS — N2581 Secondary hyperparathyroidism of renal origin: Secondary | ICD-10-CM | POA: Diagnosis present

## 2018-08-29 DIAGNOSIS — L02619 Cutaneous abscess of unspecified foot: Secondary | ICD-10-CM

## 2018-08-29 DIAGNOSIS — E11610 Type 2 diabetes mellitus with diabetic neuropathic arthropathy: Secondary | ICD-10-CM | POA: Diagnosis present

## 2018-08-29 DIAGNOSIS — K529 Noninfective gastroenteritis and colitis, unspecified: Secondary | ICD-10-CM | POA: Diagnosis present

## 2018-08-29 DIAGNOSIS — Z833 Family history of diabetes mellitus: Secondary | ICD-10-CM

## 2018-08-29 DIAGNOSIS — Z992 Dependence on renal dialysis: Secondary | ICD-10-CM

## 2018-08-29 DIAGNOSIS — N186 End stage renal disease: Secondary | ICD-10-CM

## 2018-08-29 DIAGNOSIS — Z794 Long term (current) use of insulin: Secondary | ICD-10-CM

## 2018-08-29 HISTORY — DX: Other chronic osteomyelitis, unspecified ankle and foot: M86.679

## 2018-08-29 LAB — COMPREHENSIVE METABOLIC PANEL
ALT: 9 U/L (ref 0–44)
AST: 18 U/L (ref 15–41)
Albumin: 2.3 g/dL — ABNORMAL LOW (ref 3.5–5.0)
Alkaline Phosphatase: 80 U/L (ref 38–126)
Anion gap: 14 (ref 5–15)
BUN: 35 mg/dL — ABNORMAL HIGH (ref 6–20)
CO2: 24 mmol/L (ref 22–32)
Calcium: 8.2 mg/dL — ABNORMAL LOW (ref 8.9–10.3)
Chloride: 100 mmol/L (ref 98–111)
Creatinine, Ser: 7.59 mg/dL — ABNORMAL HIGH (ref 0.61–1.24)
GFR calc Af Amer: 9 mL/min — ABNORMAL LOW (ref >60)
GFR calc non Af Amer: 8 mL/min — ABNORMAL LOW (ref >60)
Glucose, Bld: 355 mg/dL — ABNORMAL HIGH (ref 70–99)
Potassium: 3.6 mmol/L (ref 3.5–5.1)
Sodium: 138 mmol/L (ref 135–145)
Total Bilirubin: 1 mg/dL (ref 0.3–1.2)
Total Protein: 7.6 g/dL (ref 6.5–8.1)

## 2018-08-29 LAB — LACTIC ACID, PLASMA: Lactic Acid, Venous: 1.5 mmol/L (ref 0.5–1.9)

## 2018-08-29 LAB — C-REACTIVE PROTEIN: CRP: 29.8 mg/dL — ABNORMAL HIGH (ref <1.0)

## 2018-08-29 MED ORDER — SODIUM CHLORIDE 0.9 % IV SOLN
2.0000 g | INTRAVENOUS | Status: DC
  Administered 2018-08-30: 2 g via INTRAVENOUS
  Filled 2018-08-29 (×2): qty 20

## 2018-08-29 MED ORDER — INSULIN ASPART 100 UNIT/ML ~~LOC~~ SOLN: 0.0000 [IU] | SUBCUTANEOUS | Status: DC

## 2018-08-29 MED ORDER — PIPERACILLIN-TAZOBACTAM IN DEX 2-0.25 GM/50ML IV SOLN: 2.2500 g | Freq: Once | INTRAVENOUS | Status: DC

## 2018-08-29 MED ORDER — LACTATED RINGERS IV BOLUS
500.0000 mL | Freq: Once | INTRAVENOUS | Status: AC
  Administered 2018-08-30: 500 mL via INTRAVENOUS

## 2018-08-29 MED ORDER — METRONIDAZOLE IN NACL 5-0.79 MG/ML-% IV SOLN
500.0000 mg | Freq: Three times a day (TID) | INTRAVENOUS | Status: DC
  Administered 2018-08-30: 500 mg via INTRAVENOUS
  Filled 2018-08-29 (×2): qty 100

## 2018-08-29 MED ORDER — IOHEXOL 300 MG/ML  SOLN
100.0000 mL | Freq: Once | INTRAMUSCULAR | Status: AC | PRN
  Administered 2018-08-29: 100 mL via INTRAVENOUS

## 2018-08-29 MED ORDER — BICTEGRAVIR-EMTRICITAB-TENOFOV 50-200-25 MG PO TABS
1.0000 | ORAL_TABLET | Freq: Every day | ORAL | Status: DC
  Administered 2018-08-30 – 2018-09-07 (×9): 1 via ORAL
  Filled 2018-08-29 (×9): qty 1

## 2018-08-29 MED ORDER — LIDOCAINE HCL (PF) 1 % IJ SOLN
10.0000 mL | Freq: Once | INTRAMUSCULAR | Status: DC
  Filled 2018-08-29: qty 10

## 2018-08-29 MED ORDER — INSULIN GLARGINE 100 UNIT/ML ~~LOC~~ SOLN
5.0000 [IU] | Freq: Two times a day (BID) | SUBCUTANEOUS | Status: DC
  Administered 2018-08-30 – 2018-09-06 (×14): 5 [IU] via SUBCUTANEOUS
  Filled 2018-08-29 (×18): qty 0.05

## 2018-08-29 MED ORDER — VANCOMYCIN HCL 10 G IV SOLR
1250.0000 mg | Freq: Once | INTRAVENOUS | Status: AC
  Administered 2018-08-30: 1250 mg via INTRAVENOUS
  Filled 2018-08-29: qty 1250

## 2018-08-29 MED ORDER — SODIUM CHLORIDE 0.9 % IV SOLN: 1.5000 g | Freq: Once | INTRAVENOUS | Status: DC

## 2018-08-29 MED ORDER — PIPERACILLIN-TAZOBACTAM 3.375 G IVPB 30 MIN
3.3750 g | Freq: Once | INTRAVENOUS | Status: AC
  Administered 2018-08-29: 23:00:00 3.375 g via INTRAVENOUS
  Filled 2018-08-29: qty 50

## 2018-08-29 NOTE — ED Notes (Signed)
Nurse drawing labs. 

## 2018-08-29 NOTE — ED Notes (Signed)
Large amount of liquid stool in bedpan as well as on floor.

## 2018-08-29 NOTE — ED Provider Notes (Addendum)
[facility] EMERGENCY DEPARTMENT Provider Note   CSN: [id number] Arrival date & time: 08/29/18  2128    History   Chief Complaint Chief Complaint  Patient presents with  . Foot Swelling    right    HPI Alvin Daniels is a 48 y.o. male.     HPI 48 year old man with the below past medical history presents to the emergency department for evaluation of swelling of the right lateral malleolus of his right ankle ongoing for the last 15 days.  Has not seen a physician about this.  Had a fever at home approximately 2 days ago.  Appetite is been normal.  Was seen here 2 days ago and had normal vital signs and otherwise unremarkable examination and was discharged at that time.  Notes some tenderness and pain to the right ankle.  Missed Thursday dialysis but went to dialysis today and when the nurse saw the ankle he was instructed to come here for evaluation.  The patient Spanish-speaking only.  Denies any changes in bowel or bladder habits.  No recent injuries to the ankle that he is aware of. Past Medical History:  Diagnosis Date  . AIDS ([place]) 11/22/2014  . Chronic diarrhea   . Chronic hepatitis C without hepatic coma ([place]) 11/22/2014  . Diabetic neuropathy ([place])   . ESRD (end stage renal disease) on dialysis [facility])    "TTS; don't remember street name" (05/03/2014)  . Hepatitis C   . HIV INFECTION 06/27/2010   Qualifier: Diagnosis of  By: Nickola Major CMA ( [place]), Geni Bers    . Hypotension 06/02/2012  . Metabolic bone disease 01/12/3299  . MRSA infection   . Normocytic anemia 06/17/2012  . Pancreatitis   . Pressure ulcer of BKA stump, stage 2 ([place]) 11/22/2014  . Renal disorder   . Severe protein-calorie malnutrition ([place]) 06/17/2012  . Uncontrolled diabetes mellitus with complications ([place]) 7/62/2633   Annotation: uncontrolled Qualifier: Diagnosis of  By: Nickola Major CMA Deborra Medina), Jacqueline      Patient Active Problem List   Diagnosis Date Noted  . Charcot foot due  to diabetes mellitus ([place]) 07/23/2018  . MRSA bacteremia   . Diabetic foot ulcer ([place]) 11/19/2017  . Femur fracture, left ([place]) 08/16/2016  . HIV (human immunodeficiency virus infection) ([place]) 08/16/2016  . GERD (gastroesophageal reflux disease) 05/24/2016  . Burn 04/10/2016  . Pressure ulcer of BKA stump, stage 2 ([place]) 11/22/2014  . Chronic hepatitis C without hepatic coma ([place]) 11/22/2014  . Acquired absence of left leg below knee ([place]) 06/17/2014  . HIV disease ([place])   . Poor dentition 10/25/2013  . ESRD on dialysis ([place]) 10/25/2013  . Vision changes 06/21/2013  . Orthostatic headache 04/15/2013  . Anemia of chronic renal failure 06/19/2012  . Metabolic bone disease 35/45/6256  . Chronic diarrhea 06/17/2012  . Severe protein-calorie malnutrition ([place]) 06/17/2012  . Normocytic anemia 06/17/2012  . Polyneuropathy in diabetes(357.2) 06/27/2010  . Diabetes mellitus with ESRD (end-stage renal disease) ([place]) 01/03/2010    Past Surgical History:  Procedure Laterality Date  . AMPUTATION Left 04/20/2014   Procedure: 3rd toe amputation, 4th Toe Amputation,  5th Toe Amputation;  Surgeon: Newt Minion, MD;  Location: [place];  Service: Orthopedics;  Laterality: Left;  . AMPUTATION Left 05/02/2014   Procedure: Midfoot Amputation;  Surgeon: Newt Minion, MD;  Location: [place];  Service: Orthopedics;  Laterality: Left;  . AMPUTATION Left 06/17/2014   Procedure: AMPUTATION BELOW KNEE;  Surgeon: Newt Minion, MD;  Location:  [place] [facility];  Service: Orthopedics;  Laterality: Left;  . AV FISTULA PLACEMENT Left   . AV FISTULA PLACEMENT Left 05/10/2016   Procedure: Creation Left Arm Brachiocephalic Arteriovenous Fistula and Ligation of Radiocephalic Fistula;  Surgeon: Angelia Mould, MD;  Location: [place];  Service: Vascular;  Laterality: Left;  . FEMUR IM NAIL Left 08/17/2016   Procedure: INTRAMEDULLARY (IM) RETROGRADE FEMORAL NAILING;  Surgeon: Marybelle Killings, MD;  Location: [place];  Service: Orthopedics;   Laterality: Left;  . FOOT AMPUTATION THROUGH ANKLE Left 12/'21/2015   midfoot  . IR GENERIC HISTORICAL Left 05/01/2016   IR THROMBECTOMY AV FISTULA W/THROMBOLYSIS/PTA INC/SHUNT/IMG LEFT 05/01/2016 [name], MD [facility]  . IR GENERIC HISTORICAL  05/01/2016   IR US GUIDE VASC ACCESS LEFT 05/01/2016 [name], MD [facility]  . IR GENERIC HISTORICAL  05/07/2016   IR FLUORO GUIDE CV LINE RIGHT 05/07/2016 Corrie Mckusick, DO [facility]  . IR GENERIC HISTORICAL  05/07/2016   IR US GUIDE VASC ACCESS RIGHT 05/07/2016 Corrie Mckusick, DO [facility]  . IR GENERIC HISTORICAL  05/22/2016   IR US GUIDE VASC ACCESS RIGHT 05/22/2016 Greggory Keen, MD [facility]  . IR GENERIC HISTORICAL  05/22/2016   IR FLUORO GUIDE CV LINE RIGHT 05/22/2016 Greggory Keen, MD [facility]  . IR REMOVAL TUN CV CATH W/O FL  08/21/2016  . PERIPHERAL VASCULAR CATHETERIZATION Left 05/09/2016   Procedure: A/V Fistulagram;  Surgeon: Angelia Mould, MD;  Location: [place] CV LAB;  Service: Cardiovascular;  Laterality: Left;  arm  . TEE WITHOUT CARDIOVERSION N/A 06/22/2018   Procedure: TRANSESOPHAGEAL ECHOCARDIOGRAM (TEE);  Surgeon: Jerline Pain, MD;  Location: [facility] ENDOSCOPY;  Service: Cardiovascular;  Laterality: N/A;        Home Medications    Prior to Admission medications   Medication Sig Start Date End Date Taking? Authorizing Provider  acetaminophen (TYLENOL) 325 MG tablet Take 2 tablets (650 mg total) by mouth every 6 (six) hours as needed for mild pain. 06/23/18   Mariel Aloe, MD  bictegravir-emtricitabine-tenofovir AF (BIKTARVY) 50-200-25 MG TABS tablet Take 1 tablet by mouth daily. 11/19/17   Comer, Okey Regal, MD  insulin NPH-regular Human (70-30) 100 UNIT/ML injection Inject into the skin. Inject 15 units subcutaneous in the a.m. and 10 units subcutaneous in the p.m. with meals    [provider]    Family History Family History  Problem Relation Age of Onset  . Diabetes  Mother   . Diabetes Father     Social History Social History   Tobacco Use  . Smoking status: Never Smoker  . Smokeless tobacco: Never Used  Substance Use Topics  . Alcohol use: No  . Drug use: No     Allergies   Patient has no known allergies.   Review of Systems Review of Systems  Constitutional: Positive for fever. Negative for activity change, appetite change and chills.  HENT: Negative for ear pain and sore throat.   Eyes: Negative for pain and visual disturbance.  Respiratory: Negative for cough and shortness of breath.   Cardiovascular: Negative for chest pain and palpitations.  Gastrointestinal: Negative for abdominal pain and vomiting.  Genitourinary: Negative for dysuria and hematuria.  Musculoskeletal: Positive for arthralgias and joint swelling. Negative for back pain.  Skin: Negative for color change and rash.  Neurological: Negative for seizures and syncope.  All other systems reviewed and are negative.    Physical Exam Updated Vital Signs BP 104/65 (BP Location: Right Arm)  Pulse (!) 106   Temp 98 F (36.7 C) (Oral)   Resp 16   SpO2 100%   Physical Exam Vitals signs and nursing note reviewed.  Constitutional:      Appearance: He is well-developed. He is ill-appearing. He is not diaphoretic.  HENT:     Head: Normocephalic and atraumatic.     Nose: No congestion or rhinorrhea.     Mouth/Throat:     Pharynx: No oropharyngeal exudate or posterior oropharyngeal erythema.  Eyes:     Conjunctiva/sclera: Conjunctivae normal.  Neck:     Musculoskeletal: Neck supple.  Cardiovascular:     Rate and Rhythm: Regular rhythm. Tachycardia present.     Heart sounds: No murmur.  Pulmonary:     Effort: Pulmonary effort is normal. No respiratory distress.     Breath sounds: Normal breath sounds.  Abdominal:     Palpations: Abdomen is soft.     Tenderness: There is no abdominal tenderness.  Musculoskeletal:     Right lower leg: Edema present.      Comments: Left lower extremity BKA.  Significant swelling, erythema and minimal tenderness over the right lateral malleolus of the ankle.  Appears to not only be on the lateral side but also over the anterior part of the ankle.  Still has range of movement of the ankle.  Patient states that he can still feel me touching his lower leg.  Skin:    General: Skin is warm and dry.  Neurological:     Mental Status: He is alert.      ED Treatments / Results  Labs (all labs ordered are listed, but only abnormal results are displayed) Labs Reviewed - No data to display  EKG None  Radiology No results found.  Procedures .Marland KitchenIncision and Drainage Date/Time: 08/30/2018 12:05 AM Performed by: Andee Poles, MD Authorized by: Andee Poles, MD   Location:    Type:  Abscess   Size:  10cm   Location:  Lower extremity   Lower extremity location:  Ankle Pre-procedure details:    Skin preparation:  Betadine Anesthesia (see MAR for exact dosages):    Anesthesia method:  Local infiltration   Local anesthetic:  Lidocaine 1% w/o epi Procedure type:    Complexity:  Simple Procedure details:    Incision types:  Single straight   Incision depth:  Fascia   Scalpel blade:  11   Wound management:  Probed and deloculated   Drainage:  Bloody, purulent and serosanguinous   Drainage amount:  Copious   Wound treatment:  Wound left open   Packing materials:  None Post-procedure details:    Patient tolerance of procedure:  Tolerated well, no immediate complications   (including critical care time)  Medications Ordered in ED Medications - No data to display   Initial Impression / Assessment and Plan / ED Course  I have reviewed the triage vital signs and the nursing notes.  Pertinent labs & imaging results that were available during my care of the patient were reviewed by me and considered in my medical decision making (see chart for details).       48 year old man with the above  past medical history presents to the emergency department for 2 weeks of right ankle swelling.  Appears to have multiple abscesses on CT scan however they do not appear to communicate with the joint.  Patient has diabetic neuropathy and it is unclear how much feeling he actually has in the foot though he does say that he  can feel sharp objects.  When I performed the incision and drainage a copious amount of purulence was expressed from the lateral abscess and it was left open to drain.  Given that the patient has mild tachycardia, mild leukocytosis we will admit him for possible sepsis secondary to this infection and we have started him on broad-spectrum antibiotics including Zosyn and vancomycin.  Patient otherwise is hemodynamically stable.  I have spoken to orthopedics over the phone and they are in agreement that this wound does not appear to communicate with the joint space itself and given that he has full active range of motion of the right ankle without pain it is unlikely that it has communication in the time course also does not fit for septic arthritis.  I do not believe that I can adequately aspirate and arthrocentesis without possibly seeding infection within the joint space so we will defer to inpatient management.  Final Clinical Impressions(s) / ED Diagnoses   Final diagnoses:  Abscess of foot  Tachycardia  Leukocytosis, unspecified type  ESRD (end stage renal disease) ([place])  HIV infection, unspecified symptom status [facility])    ED Discharge Orders    None       Andee Poles, MD 08/30/18 Ofilia Neas    Andee Poles, MD 08/30/18 Ileene Musa    Elnora Morrison, MD 08/30/18 [phone]

## 2018-08-30 ENCOUNTER — Encounter (HOSPITAL_COMMUNITY): Payer: Self-pay

## 2018-08-30 DIAGNOSIS — D631 Anemia in chronic kidney disease: Secondary | ICD-10-CM | POA: Diagnosis present

## 2018-08-30 DIAGNOSIS — N186 End stage renal disease: Secondary | ICD-10-CM | POA: Diagnosis present

## 2018-08-30 DIAGNOSIS — Z794 Long term (current) use of insulin: Secondary | ICD-10-CM | POA: Diagnosis not present

## 2018-08-30 DIAGNOSIS — I9589 Other hypotension: Secondary | ICD-10-CM | POA: Diagnosis present

## 2018-08-30 DIAGNOSIS — E11610 Type 2 diabetes mellitus with diabetic neuropathic arthropathy: Secondary | ICD-10-CM | POA: Diagnosis present

## 2018-08-30 DIAGNOSIS — E1140 Type 2 diabetes mellitus with diabetic neuropathy, unspecified: Secondary | ICD-10-CM | POA: Diagnosis present

## 2018-08-30 DIAGNOSIS — B182 Chronic viral hepatitis C: Secondary | ICD-10-CM | POA: Diagnosis present

## 2018-08-30 DIAGNOSIS — K219 Gastro-esophageal reflux disease without esophagitis: Secondary | ICD-10-CM | POA: Diagnosis present

## 2018-08-30 DIAGNOSIS — K529 Noninfective gastroenteritis and colitis, unspecified: Secondary | ICD-10-CM

## 2018-08-30 DIAGNOSIS — N2581 Secondary hyperparathyroidism of renal origin: Secondary | ICD-10-CM | POA: Diagnosis present

## 2018-08-30 DIAGNOSIS — R000 Tachycardia, unspecified: Secondary | ICD-10-CM | POA: Diagnosis present

## 2018-08-30 DIAGNOSIS — M869 Osteomyelitis, unspecified: Secondary | ICD-10-CM | POA: Diagnosis present

## 2018-08-30 DIAGNOSIS — Z992 Dependence on renal dialysis: Secondary | ICD-10-CM | POA: Diagnosis not present

## 2018-08-30 DIAGNOSIS — Z89512 Acquired absence of left leg below knee: Secondary | ICD-10-CM | POA: Diagnosis not present

## 2018-08-30 DIAGNOSIS — Z833 Family history of diabetes mellitus: Secondary | ICD-10-CM | POA: Diagnosis not present

## 2018-08-30 DIAGNOSIS — B20 Human immunodeficiency virus [HIV] disease: Secondary | ICD-10-CM | POA: Diagnosis present

## 2018-08-30 DIAGNOSIS — M898X9 Other specified disorders of bone, unspecified site: Secondary | ICD-10-CM | POA: Diagnosis present

## 2018-08-30 DIAGNOSIS — E1122 Type 2 diabetes mellitus with diabetic chronic kidney disease: Secondary | ICD-10-CM | POA: Diagnosis present

## 2018-08-30 DIAGNOSIS — I120 Hypertensive chronic kidney disease with stage 5 chronic kidney disease or end stage renal disease: Secondary | ICD-10-CM | POA: Diagnosis present

## 2018-08-30 DIAGNOSIS — A4102 Sepsis due to Methicillin resistant Staphylococcus aureus: Secondary | ICD-10-CM | POA: Diagnosis present

## 2018-08-30 DIAGNOSIS — E1169 Type 2 diabetes mellitus with other specified complication: Secondary | ICD-10-CM | POA: Diagnosis present

## 2018-08-30 LAB — CBC WITH DIFFERENTIAL/PLATELET
Abs Immature Granulocytes: 0 10*3/uL (ref 0.00–0.07)
Basophils Absolute: 0 10*3/uL (ref 0.0–0.1)
Basophils Relative: 0 %
Eosinophils Absolute: 0.4 10*3/uL (ref 0.0–0.5)
Eosinophils Relative: 3 %
HCT: 28.8 % — ABNORMAL LOW (ref 39.0–52.0)
Hemoglobin: 8.7 g/dL — ABNORMAL LOW (ref 13.0–17.0)
Lymphocytes Relative: 3 %
Lymphs Abs: 0.4 10*3/uL — ABNORMAL LOW (ref 0.7–4.0)
MCH: 28.8 pg (ref 26.0–34.0)
MCHC: 30.2 g/dL (ref 30.0–36.0)
MCV: 95.4 fL (ref 80.0–100.0)
Monocytes Absolute: 0.1 10*3/uL (ref 0.1–1.0)
Monocytes Relative: 1 %
Neutro Abs: 11.2 10*3/uL — ABNORMAL HIGH (ref 1.7–7.7)
Neutrophils Relative %: 93 %
Platelets: 240 10*3/uL (ref 150–400)
RBC: 3.02 MIL/uL — ABNORMAL LOW (ref 4.22–5.81)
RDW: 14.9 % (ref 11.5–15.5)
WBC: 12 10*3/uL — ABNORMAL HIGH (ref 4.0–10.5)
nRBC: 0 % (ref 0.0–0.2)
nRBC: 0 /100 WBC

## 2018-08-30 LAB — BLOOD CULTURE ID PANEL (REFLEXED)
Acinetobacter baumannii: NOT DETECTED
Acinetobacter baumannii: NOT DETECTED
Candida albicans: NOT DETECTED
Candida albicans: NOT DETECTED
Candida glabrata: NOT DETECTED
Candida glabrata: NOT DETECTED
Candida krusei: NOT DETECTED
Candida krusei: NOT DETECTED
Candida parapsilosis: NOT DETECTED
Candida parapsilosis: NOT DETECTED
Candida tropicalis: NOT DETECTED
Candida tropicalis: NOT DETECTED
Enterobacter cloacae complex: NOT DETECTED
Enterobacter cloacae complex: NOT DETECTED
Enterobacteriaceae species: NOT DETECTED
Enterobacteriaceae species: NOT DETECTED
Enterococcus species: NOT DETECTED
Enterococcus species: NOT DETECTED
Escherichia coli: NOT DETECTED
Escherichia coli: NOT DETECTED
Haemophilus influenzae: NOT DETECTED
Haemophilus influenzae: NOT DETECTED
Klebsiella oxytoca: NOT DETECTED
Klebsiella oxytoca: NOT DETECTED
Klebsiella pneumoniae: NOT DETECTED
Klebsiella pneumoniae: NOT DETECTED
Listeria monocytogenes: NOT DETECTED
Listeria monocytogenes: NOT DETECTED
Methicillin resistance: DETECTED — AB
Methicillin resistance: DETECTED — AB
Neisseria meningitidis: NOT DETECTED
Neisseria meningitidis: NOT DETECTED
Proteus species: NOT DETECTED
Proteus species: NOT DETECTED
Pseudomonas aeruginosa: NOT DETECTED
Pseudomonas aeruginosa: NOT DETECTED
Serratia marcescens: NOT DETECTED
Serratia marcescens: NOT DETECTED
Staphylococcus aureus (BCID): DETECTED — AB
Staphylococcus aureus (BCID): DETECTED — AB
Staphylococcus species: DETECTED — AB
Staphylococcus species: DETECTED — AB
Streptococcus agalactiae: NOT DETECTED
Streptococcus agalactiae: NOT DETECTED
Streptococcus pneumoniae: NOT DETECTED
Streptococcus pneumoniae: NOT DETECTED
Streptococcus pyogenes: NOT DETECTED
Streptococcus pyogenes: NOT DETECTED
Streptococcus species: NOT DETECTED
Streptococcus species: NOT DETECTED

## 2018-08-30 LAB — GLUCOSE, CAPILLARY
Glucose-Capillary: 183 mg/dL — ABNORMAL HIGH (ref 70–99)
Glucose-Capillary: 168 mg/dL — ABNORMAL HIGH (ref 70–99)
Glucose-Capillary: 213 mg/dL — ABNORMAL HIGH (ref 70–99)
Glucose-Capillary: 195 mg/dL — ABNORMAL HIGH (ref 70–99)

## 2018-08-30 LAB — LACTIC ACID, PLASMA: Lactic Acid, Venous: 0.9 mmol/L (ref 0.5–1.9)

## 2018-08-30 LAB — CBC
HCT: 24.8 % — ABNORMAL LOW (ref 39.0–52.0)
Hemoglobin: 7.5 g/dL — ABNORMAL LOW (ref 13.0–17.0)
MCH: 28.7 pg (ref 26.0–34.0)
MCHC: 30.2 g/dL (ref 30.0–36.0)
MCV: 95 fL (ref 80.0–100.0)
Platelets: 199 10*3/uL (ref 150–400)
RBC: 2.61 MIL/uL — ABNORMAL LOW (ref 4.22–5.81)
RDW: 14.9 % (ref 11.5–15.5)
WBC: 10.3 10*3/uL (ref 4.0–10.5)
nRBC: 0 % (ref 0.0–0.2)

## 2018-08-30 LAB — SEDIMENTATION RATE: Sed Rate: 126 mm/hr — ABNORMAL HIGH (ref 0–16)

## 2018-08-30 LAB — BASIC METABOLIC PANEL
Anion gap: 14 (ref 5–15)
BUN: 38 mg/dL — ABNORMAL HIGH (ref 6–20)
CO2: 22 mmol/L (ref 22–32)
Calcium: 8 mg/dL — ABNORMAL LOW (ref 8.9–10.3)
Chloride: 100 mmol/L (ref 98–111)
Creatinine, Ser: 8.01 mg/dL — ABNORMAL HIGH (ref 0.61–1.24)
GFR calc Af Amer: 8 mL/min — ABNORMAL LOW (ref >60)
GFR calc non Af Amer: 7 mL/min — ABNORMAL LOW (ref >60)
Glucose, Bld: 199 mg/dL — ABNORMAL HIGH (ref 70–99)
Potassium: 3.7 mmol/L (ref 3.5–5.1)
Sodium: 136 mmol/L (ref 135–145)

## 2018-08-30 LAB — PREALBUMIN: Prealbumin: 9.4 mg/dL — ABNORMAL LOW (ref 18–38)

## 2018-08-30 MED ORDER — DARBEPOETIN ALFA 200 MCG/0.4ML IJ SOSY
200.0000 ug | PREFILLED_SYRINGE | INTRAMUSCULAR | Status: DC
  Administered 2018-09-01: 11:00:00 200 ug via INTRAVENOUS
  Filled 2018-08-30: qty 0.4

## 2018-08-30 MED ORDER — DOXERCALCIFEROL 4 MCG/2ML IV SOLN: 3.0000 ug | INTRAVENOUS | Status: DC

## 2018-08-30 MED ORDER — INSULIN ASPART 100 UNIT/ML ~~LOC~~ SOLN
0.0000 [IU] | Freq: Three times a day (TID) | SUBCUTANEOUS | Status: DC
  Administered 2018-08-30 – 2018-08-31 (×4): 2 [IU] via SUBCUTANEOUS
  Administered 2018-08-31: 1 [IU] via SUBCUTANEOUS
  Administered 2018-08-31: 2 [IU] via SUBCUTANEOUS
  Administered 2018-09-02: 7 [IU] via SUBCUTANEOUS
  Administered 2018-09-02 (×2): 1 [IU] via SUBCUTANEOUS
  Administered 2018-09-03: 2 [IU] via SUBCUTANEOUS
  Administered 2018-09-04: 1 [IU] via SUBCUTANEOUS
  Administered 2018-09-05: 7 [IU] via SUBCUTANEOUS
  Administered 2018-09-05: 1 [IU] via SUBCUTANEOUS
  Administered 2018-09-06: 3 [IU] via SUBCUTANEOUS
  Administered 2018-09-06: 7 [IU] via SUBCUTANEOUS
  Administered 2018-09-06: 2 [IU] via SUBCUTANEOUS
  Administered 2018-09-07 (×2): 5 [IU] via SUBCUTANEOUS

## 2018-08-30 MED ORDER — ONDANSETRON HCL 4 MG/2ML IJ SOLN
4.0000 mg | Freq: Four times a day (QID) | INTRAMUSCULAR | Status: DC | PRN
  Filled 2018-08-30: qty 2

## 2018-08-30 MED ORDER — ACETAMINOPHEN 650 MG RE SUPP: 650.0000 mg | Freq: Four times a day (QID) | RECTAL | Status: DC | PRN

## 2018-08-30 MED ORDER — ACETAMINOPHEN 325 MG PO TABS: 650.0000 mg | ORAL_TABLET | Freq: Four times a day (QID) | ORAL | Status: DC | PRN

## 2018-08-30 MED ORDER — MIDODRINE HCL 5 MG PO TABS
10.0000 mg | ORAL_TABLET | ORAL | Status: DC
  Administered 2018-09-01 – 2018-09-05 (×3): 10 mg via ORAL
  Filled 2018-08-30 (×2): qty 2

## 2018-08-30 MED ORDER — VANCOMYCIN HCL IN DEXTROSE 500-5 MG/100ML-% IV SOLN
500.0000 mg | INTRAVENOUS | Status: DC
  Administered 2018-09-01 – 2018-09-03 (×2): 500 mg via INTRAVENOUS
  Filled 2018-08-30 (×5): qty 100

## 2018-08-30 MED ORDER — HYDROCODONE-ACETAMINOPHEN 5-325 MG PO TABS
1.0000 | ORAL_TABLET | Freq: Four times a day (QID) | ORAL | Status: DC | PRN
  Administered 2018-08-30 – 2018-09-05 (×12): 2 via ORAL
  Administered 2018-09-05: 1 via ORAL
  Administered 2018-09-06: 2 via ORAL
  Filled 2018-08-30 (×15): qty 2

## 2018-08-30 MED ORDER — ONDANSETRON HCL 4 MG PO TABS
4.0000 mg | ORAL_TABLET | Freq: Four times a day (QID) | ORAL | Status: DC | PRN
  Administered 2018-08-30 – 2018-09-01 (×2): 4 mg via ORAL
  Filled 2018-08-30 (×2): qty 1

## 2018-08-30 MED ORDER — PROMETHAZINE HCL 25 MG/ML IJ SOLN
12.5000 mg | Freq: Four times a day (QID) | INTRAMUSCULAR | Status: AC | PRN
  Administered 2018-08-30: 12.5 mg via INTRAVENOUS
  Filled 2018-08-30: qty 1

## 2018-08-30 MED ORDER — INSULIN ASPART 100 UNIT/ML ~~LOC~~ SOLN
0.0000 [IU] | Freq: Every day | SUBCUTANEOUS | Status: DC
  Administered 2018-09-04 – 2018-09-06 (×2): 4 [IU] via SUBCUTANEOUS

## 2018-08-30 NOTE — Progress Notes (Signed)
Inpatient Diabetes Program Recommendations  AACE/ADA: New Consensus Statement on Inpatient Glycemic Control (2015)  Target Ranges:  Prepandial:   less than 140 mg/dL      Peak postprandial:   less than 180 mg/dL (1-2 hours)      Critically ill patients:  140 - 180 mg/dL   Lab Results  Component Value Date   GLUCAP 168 (H) 08/30/2018   HGBA1C 9.0 (H) 06/18/2018    Review of Glycemic Control  Diabetes history: DM2 Outpatient Diabetes medications: NPH 70/30 15 units in am and 10 units in pm Current orders for Inpatient glycemic control: Lantus 5 units bid, Novolog 0-9 units tidwc and hs.  HgbA1C - 9.0% MD at [facility] is managing his diabetes Has been seen by diabetes coordinator back in Feb 2020 during hospitalization. Blood sugars look good thus far.   Inpatient Diabetes Program Recommendations:     No recs at present.  May need meal coverage insulin if post-prandial blood sugars > 180 mg/dL.  Will follow.  Thank you. Lorenda Peck, RD, LDN, CDE Inpatient Diabetes Coordinator [phone]

## 2018-08-30 NOTE — Progress Notes (Signed)
CSW acknowledges consult, case management will assist with medication assistance at discharge.   CSW signing off.   Domenic Schwab, MSW, [place]

## 2018-08-30 NOTE — Progress Notes (Signed)
New Admission Note:  Arrival Method: By bed from ED around 0125 Mental Orientation: Alert and oriented x4 per interpreter.  Telemetry: None Assessment: Completed Skin: Completed, refer to flowsheets IV: Right forearm Pain: 10/10 in Right foot Tubes: None Safety Measures: Safety Fall Prevention Plan was given, discussed  Admission: Completed [facility] Orientation: Patient has been orientated to the room, unit and the staff. Family: None  Orders have been reviewed and implemented. Will continue to monitor the patient. Call light has been placed within reach and bed alarm has been activated.   Perry Mount, RN  Phone Number: [phone]

## 2018-08-30 NOTE — Progress Notes (Signed)
Patient ID: Alvin Daniels, male   DOB: [birth year], 48 y.o.   MRN: [id number]  Patient seen, no current complaints.  Pain, swelling, mobility improved I&D.  Patient without any new complaints.  Tolerating diet.  No chest pain, shortness of breath, nausea, vomiting, abdominal pain or edema.  Patient continues on broad-spectrum antibiotics.  Ortho to see.  Nephrology will follow.  Agree with current management.  Truett Mainland, DO 08/30/2018

## 2018-08-30 NOTE — Progress Notes (Signed)
PHARMACY - PHYSICIAN COMMUNICATION CRITICAL VALUE ALERT - BLOOD CULTURE IDENTIFICATION (BCID)  Alvin Daniels is an 48 y.o. male who presented to [facility] on 08/29/2018 with a chief complaint of right ankle infection.  Assessment:  48 y/o male with ESRD with right ankle swelling for past 15 days or so. EDP performed bedside I+D with reported large amount of purulent drainage. Culture pending. Recent MRSA bacteremia in 06/2018. BCID shows MRSA.  Name of physician (or Provider) Contacted: Dr Nehemiah Settle  Current antibiotics: vancomycin + ceftriaxone + metronidazole  Changes to prescribed antibiotics recommended:  Continue vancomycin; discontinue ceftriaxone + metronidazole  Results for orders placed or performed during the hospital encounter of 08/29/18  Blood Culture ID Panel (Reflexed) (Collected: 08/29/2018 10:16 PM)  Result Value Ref Range   Enterococcus species NOT DETECTED NOT DETECTED   Listeria monocytogenes NOT DETECTED NOT DETECTED   Staphylococcus species DETECTED (A) NOT DETECTED   Staphylococcus aureus (BCID) DETECTED (A) NOT DETECTED   Methicillin resistance DETECTED (A) NOT DETECTED   Streptococcus species NOT DETECTED NOT DETECTED   Streptococcus agalactiae NOT DETECTED NOT DETECTED   Streptococcus pneumoniae NOT DETECTED NOT DETECTED   Streptococcus pyogenes NOT DETECTED NOT DETECTED   Acinetobacter baumannii NOT DETECTED NOT DETECTED   Enterobacteriaceae species NOT DETECTED NOT DETECTED   Enterobacter cloacae complex NOT DETECTED NOT DETECTED   Escherichia coli NOT DETECTED NOT DETECTED   Klebsiella oxytoca NOT DETECTED NOT DETECTED   Klebsiella pneumoniae NOT DETECTED NOT DETECTED   Proteus species NOT DETECTED NOT DETECTED   Serratia marcescens NOT DETECTED NOT DETECTED   Haemophilus influenzae NOT DETECTED NOT DETECTED   Neisseria meningitidis NOT DETECTED NOT DETECTED   Pseudomonas aeruginosa NOT DETECTED NOT DETECTED   Candida albicans NOT DETECTED NOT  DETECTED   Candida glabrata NOT DETECTED NOT DETECTED   Candida krusei NOT DETECTED NOT DETECTED   Candida parapsilosis NOT DETECTED NOT DETECTED   Candida tropicalis NOT DETECTED NOT [place], PharmD, BCPS Clinical Pharmacist Clinical phone for 08/30/2018 until 10p is [phone] 08/30/2018 6:30 PM  **Pharmacist phone directory can now be found on [email or web address] listed under [place]**
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
## 2018-08-30 NOTE — H&P (Signed)
History and Physical    Alvin Daniels TOI:[id number] DOB: [birth year] DOA: 08/29/2018  PCP: [name], MD  Patient coming from: Home  I have personally briefly reviewed patient's old medical records in [place]  Chief Complaint: Ankle pain and swelling  HPI: Alvin Daniels is a 48 y.o. male with medical history significant of ESRD due to uncontrolled DM2, HIV, HCV, chronic diarrhea, s/p L BKA.  Patient was recently admitted in Feb this year with sepsis due to MRSA bacteremia, belived to be due to R foot source, no osteo on MRI but question of abscess at that time.  Today patient presents to the ED with c/o swelling of R ankle for past 15 days or so.  Hasnt seen doctor about this until today.  Had fever at home about 2 days ago.  Appetitie normal.  Has TTP of R ankle, missed thurs dialysis but went today.   ED Course: Exam and CT reveal abscess(es) of R ankle.  Ortho consulted, they dont think it communicates with the joint and patient is able to move the R ankle.  EDP performed bedside I+D with reported large amount of purulent drainage.  Culture pending.  Patient given dose of zosyn and vanc in ED.   Review of Systems: As per HPI otherwise 10 point review of systems negative.   Past Medical History:  Diagnosis Date   AIDS ([place]) 11/22/2014   Chronic diarrhea    Chronic hepatitis C without hepatic coma ([place]) 11/22/2014   Diabetic neuropathy ([name])    ESRD (end stage renal disease) on dialysis ([place])    "TTS; don't remember street name" (05/03/2014)   Hepatitis C    HIV INFECTION 06/27/2010   Qualifier: Diagnosis of  By: Nickola Major CMA ( AAMA), Jacqueline     Hypotension 8/33/8250   Metabolic bone disease 09/12/9765   MRSA infection    Normocytic anemia 06/17/2012   Pancreatitis    Pressure ulcer of BKA stump, stage 2 ([place]) 11/22/2014   Renal disorder    Severe protein-calorie malnutrition ([place]) 06/17/2012   Uncontrolled diabetes mellitus  with complications ([place]) 3/41/9379   Annotation: uncontrolled Qualifier: Diagnosis of  By: Nickola Major CMA Deborra Medina), Geni Bers      Past Surgical History:  Procedure Laterality Date   AMPUTATION Left 04/20/2014   Procedure: 3rd toe amputation, 4th Toe Amputation,  5th Toe Amputation;  Surgeon: Newt Minion, MD;  Location: [place];  Service: Orthopedics;  Laterality: Left;   AMPUTATION Left 05/02/2014   Procedure: Midfoot Amputation;  Surgeon: Newt Minion, MD;  Location: [place];  Service: Orthopedics;  Laterality: Left;   AMPUTATION Left 06/17/2014   Procedure: AMPUTATION BELOW KNEE;  Surgeon: Newt Minion, MD;  Location: [place];  Service: Orthopedics;  Laterality: Left;   AV FISTULA PLACEMENT Left    AV FISTULA PLACEMENT Left 05/10/2016   Procedure: Creation Left Arm Brachiocephalic Arteriovenous Fistula and Ligation of Radiocephalic Fistula;  Surgeon: Angelia Mould, MD;  Location: [place];  Service: Vascular;  Laterality: Left;   FEMUR IM NAIL Left 08/17/2016   Procedure: INTRAMEDULLARY (IM) RETROGRADE FEMORAL NAILING;  Surgeon: Marybelle Killings, MD;  Location: [place];  Service: Orthopedics;  Laterality: Left;   FOOT AMPUTATION THROUGH ANKLE Left 12/'21/2015   midfoot   IR GENERIC HISTORICAL Left 05/01/2016   IR THROMBECTOMY AV FISTULA W/THROMBOLYSIS/PTA INC/SHUNT/IMG LEFT 05/01/2016 [name], MD [facility]   IR GENERIC HISTORICAL  05/01/2016   IR US GUIDE VASC ACCESS LEFT 05/01/2016 [name],  MD [facility]   IR GENERIC HISTORICAL  05/07/2016   IR FLUORO GUIDE CV LINE RIGHT 05/07/2016 Corrie Mckusick, DO [facility]   IR GENERIC HISTORICAL  05/07/2016   IR US GUIDE VASC ACCESS RIGHT 05/07/2016 Corrie Mckusick, DO [facility]   IR GENERIC HISTORICAL  05/22/2016   IR US GUIDE VASC ACCESS RIGHT 05/22/2016 Greggory Keen, MD [facility]   IR GENERIC HISTORICAL  05/22/2016   IR FLUORO GUIDE CV LINE RIGHT 05/22/2016 Greggory Keen, MD [facility]   IR REMOVAL TUN CV  CATH W/O [facility]  08/21/2016   PERIPHERAL VASCULAR CATHETERIZATION Left 05/09/2016   Procedure: A/V Fistulagram;  Surgeon: Angelia Mould, MD;  Location: [facility] [place] [facility];  Service: Cardiovascular;  Laterality: Left;  arm   TEE WITHOUT CARDIOVERSION N/A 06/22/2018   Procedure: TRANSESOPHAGEAL ECHOCARDIOGRAM (TEE);  Surgeon: Jerline Pain, MD;  Location: [facility] ENDOSCOPY;  Service: Cardiovascular;  Laterality: N/A;     reports that he has never smoked. He has never used smokeless tobacco. He reports that he does not drink alcohol or use drugs.  No Known Allergies  Family History  Problem Relation Age of Onset   Diabetes Mother    Diabetes Father      Prior to Admission medications   Medication Sig Start Date End Date Taking? Authorizing Provider  acetaminophen (TYLENOL) 325 MG tablet Take 2 tablets (650 mg total) by mouth every 6 (six) hours as needed for mild pain. 06/23/18   Mariel Aloe, MD  bictegravir-emtricitabine-tenofovir AF (BIKTARVY) 50-200-25 MG TABS tablet Take 1 tablet by mouth daily. 11/19/17   Comer, Okey Regal, MD  insulin NPH-regular Human (70-30) 100 UNIT/ML injection Inject into the skin. Inject 15 units subcutaneous in the a.m. and 10 units subcutaneous in the p.m. with meals    [provider]    Physical Exam: Vitals:   08/29/18 2139 08/29/18 2142 08/29/18 2200  BP: 104/65  110/81  Pulse: (!) 106  (!) 105  Resp: 16  (!) 23  Temp: 98 F (36.7 C)    TempSrc: Oral    SpO2: 100% 100% 100%    Constitutional: NAD, calm, comfortable Eyes: PERRL, lids and conjunctivae normal ENMT: Mucous membranes are moist. Posterior pharynx clear of any exudate or lesions.Normal dentition.  Neck: normal, supple, no masses, no thyromegaly Respiratory: clear to auscultation bilaterally, no wheezing, no crackles. Normal respiratory effort. No accessory muscle use.  Cardiovascular: Regular rate and rhythm, no murmurs / rubs / gallops. No extremity edema. 2+ pedal  pulses. No carotid bruits.  Abdomen: no tenderness, no masses palpated. No hepatosplenomegaly. Bowel sounds positive.  Musculoskeletal: LLE BKA, swelling, erythema, minimal TTP over R lateral malleolus.  Also has swelling of anterior part of ankle as well. Skin: no rashes, lesions, ulcers. No induration Neurologic: CN 2-12 grossly intact. Sensation intact, DTR normal. Strength 5/5 in all 4.  Psychiatric: Normal judgment and insight. Alert and oriented x 3. Normal mood.    Labs on Admission: I have personally reviewed following labs and imaging studies  CBC: Recent Labs  Lab 08/27/18 0711 08/29/18 2202  WBC 6.0 12.0*  NEUTROABS 3.9 PENDING  HGB 9.0* 8.7*  HCT 29.7* 28.8*  MCV 95.2 95.4  PLT 246 209   Basic Metabolic Panel: Recent Labs  Lab 08/27/18 0711 08/29/18 2202  NA 132* 138  K 4.1 3.6  CL 94* 100  CO2 21* 24  GLUCOSE 297* 355*  BUN 45* 35*  CREATININE 10.89* 7.59*  CALCIUM 8.8* 8.2*  GFR: Estimated Creatinine Clearance: 8.5 mL/min (A) (by C-G formula based on SCr of 7.59 mg/dL (H)). Liver Function Tests: Recent Labs  Lab 08/27/18 0711 08/29/18 2202  AST 15 18  ALT 9 9  ALKPHOS 80 80  BILITOT 0.7 1.0  PROT 9.0* 7.6  ALBUMIN 2.5* 2.3*   No results for input(s): LIPASE, AMYLASE in the last 168 hours. No results for input(s): AMMONIA in the last 168 hours. Coagulation Profile: No results for input(s): INR, PROTIME in the last 168 hours. Cardiac Enzymes: No results for input(s): CKTOTAL, CKMB, CKMBINDEX, TROPONINI in the last 168 hours. BNP (last 3 results) No results for input(s): PROBNP in the last 8760 hours. HbA1C: No results for input(s): HGBA1C in the last 72 hours. CBG: No results for input(s): GLUCAP in the last 168 hours. Lipid Profile: No results for input(s): CHOL, HDL, LDLCALC, TRIG, CHOLHDL, LDLDIRECT in the last 72 hours. Thyroid Function Tests: No results for input(s): TSH, T4TOTAL, FREET4, T3FREE, THYROIDAB in the last 72  hours. Anemia Panel: No results for input(s): VITAMINB12, FOLATE, FERRITIN, TIBC, IRON, RETICCTPCT in the last 72 hours. Urine analysis:    Component Value Date/Time   COLORURINE YELLOW 07/04/2010 1840   APPEARANCEUR TURBID (A) 07/04/2010 1840   LABSPEC 1.013 07/04/2010 1840   PHURINE 7.0 07/04/2010 1840   GLUCOSEU NEG mg/dL 07/04/2010 1840   HGBUR TRACE (A) 03/13/2010 1824   BILIRUBINUR NEG 07/04/2010 1840   KETONESUR NEG mg/dL 07/04/2010 1840   PROTEINUR > 300 mg/dL (A) 07/04/2010 1840   UROBILINOGEN 0.2 07/04/2010 1840   NITRITE NEG 07/04/2010 1840   LEUKOCYTESUR LARGE (A) 07/04/2010 1840    Radiological Exams on Admission: Ct Ankle Right W Contrast  Result Date: 08/29/2018 CLINICAL DATA:  Ankle swelling and pain EXAM: CT OF THE RIGHT ANKLE WITH CONTRAST TECHNIQUE: Multidetector CT imaging of the right ankle was performed following the standard protocol during bolus administration of intravenous contrast. CONTRAST:  189mL OMNIPAQUE IOHEXOL 300 MG/ML  SOLN COMPARISON:  06/18/2018, 06/19/2018 FINDINGS: Bones/Joint/Cartilage The distal tibia and fibula are within normal limits. Chronically fragmented calcaneus is noted with significant bony resorption although the appearance is similar to that seen on prior plain film examination. Some lucency is noted in the distal talus although no definitive fracture is seen. The remainder of the tarsal bones and visualized metatarsals appear within normal limits. Ligaments Suboptimally assessed by CT. Muscles and Tendons No discrete muscular abnormality is noted. Soft tissues There is an elongated fluid collection identified laterally which measures approximately 5.9 by 3.1 cm in greatest dimension. This extends for approximately 8 cm in craniocaudad projection. Multiple foci of air are noted within consistent with abscess. Similar appearing fluid collection is noted interposed between the fragments of the calcaneus also consistent with abscess. Some areas  of bony lucency are noted in the fragments of the calcaneus. Underlying osteomyelitis would be difficult to exclude. Smaller fluid collections are noted along the medial aspect of the talus measuring less than 1 cm. Additionally at least 2 small fluid collections are noted along the plantar aspect of the tarsal bones each measuring just over 1 cm in greatest dimension. IMPRESSION: Multifocal abscesses surrounding the ankle joint with the largest collections lying laterally adjacent to the distal fibula and lateral aspect of the calcaneal fragments as well as a second large collection with peripheral enhancement interposed between the fragments of the calcaneus. Communication with the tibiotalar and talofibular joints is not appreciated on this exam. Smaller fluid collections with enhancing margins representing smaller  abscesses are noted along the plantar aspect of the foot. Underlying osteomyelitis involving the fragments of the calcaneus would be difficult to exclude given some degree of resorption. Electronically Signed   By: Inez Catalina M.D.   On: 08/29/2018 23:14    EKG: Independently reviewed.  Assessment/Plan Principal Problem:   Abscess of ankle Active Problems:   Diabetes mellitus with ESRD (end-stage renal disease) ([place])   Chronic diarrhea   HIV disease ([place])   Charcot foot due to diabetes mellitus ([place])    1. Abscess of ankle - 1. Diabetic foot wound pathway 2. Empiric rocephin, flagyl, vanc for now 3. Suspicious for MRSA given history of Feb admit 4. BCx, wound Cx pending 5. Ortho consulted (either to see tomorrow or Monday) 6. Will let patient eat since it didn't sound like they had OR in mind for tomorrow 2. ESRD - 1. Call nephro in AM for routine IP dialysis during stay 2. Just had dialysis today though 3. DM - 1. Hold home 70/30 2. Lantus 5u BID 3. Sensitive SSI AC and HS 4. CBG checks AC/HS/3AM 4. Chronic diarrhea - 1. Had in ED as well, this apparently chronic and  stable, not believed to be C.Diff at this time. 5. HIV - continue HAART  DVT prophylaxis: SCDs for the moment Code Status: Full code Family Communication: No family in room Disposition Plan: Home after admit Consults called: EDP spoke with ortho Admission status: Admit to inpatient  Severity of Illness: The appropriate patient status for this patient is INPATIENT. Inpatient status is judged to be reasonable and necessary in order to provide the required intensity of service to ensure the patient's safety. The patient's presenting symptoms, physical exam findings, and initial radiographic and laboratory data in the context of their chronic comorbidities is felt to place them at high risk for further clinical deterioration. Furthermore, it is not anticipated that the patient will be medically stable for discharge from the hospital within 2 midnights of admission. The following factors support the patient status of inpatient.   IP status for IV abx and treatment of limb threatening infection.   * I certify that at the point of admission it is my clinical judgment that the patient will require inpatient hospital care spanning beyond 2 midnights from the point of admission due to high intensity of service, high risk for further deterioration and high frequency of surveillance required.*    Arrionna Serena M. DO [facility]  How to contact the [facility] Attending or Consulting provider [place] or covering provider during after hours [place], for this patient?  1. Check the care team in [facility] and look for a) attending/consulting [facility] provider listed and b) the [facility] team listed 2. Log into [email or web address]  Amion Physician Scheduling and messaging for groups and whole hospitals  On call and physician scheduling software for group practices, residents, hospitalists and other medical providers for call, clinic, rotation and shift schedules. OnCall [employer] is a hospital-wide system for scheduling doctors  and paging doctors on call. EasyPlot is for scientific plotting and data analysis.  [email or web address]  and use [place]'s universal password to access. If you do not have the password, please contact the hospital operator.  3. Locate the [facility] provider you are looking for under [facility] and page to a number that you can be directly reached. 4. If you still have difficulty reaching the provider, please page the [facility] (Director on Call) for the Hospitalists listed on amion for assistance.  08/30/2018, 12:35 AM

## 2018-08-30 NOTE — Progress Notes (Signed)
Pharmacy Antibiotic Note  Alvin Daniels is a 48 y.o. male admitted on 08/29/2018 with wound infection.  Pharmacy has been consulted for vancomycin dosing.  Plan: Vancomycin 1250 mg IV x 1 then 500 mg after each HD F/u cultures and clinical course     Temp (24hrs), Avg:98 F (36.7 C), Min:98 F (36.7 C), Max:98 F (36.7 C)  Recent Labs  Lab 08/27/18 0711 08/29/18 2202  WBC 6.0 12.0*  CREATININE 10.89* 7.59*  LATICACIDVEN 0.7 1.5    Estimated Creatinine Clearance: 8.5 mL/min (A) (by C-G formula based on SCr of 7.59 mg/dL (H)).    No Known Allergies  Thank you for allowing pharmacy to be a part of this patient's care.  Excell Seltzer P[place]et 08/30/2018 12:12 AM

## 2018-08-30 NOTE — Consult Note (Addendum)
[facility] Renal Consultation Note    Indication for Consultation:  Management of ESRD/hemodialysis, anemia, hypertension/volume, and secondary hyperparathyroidism. PCP:  HPI: Alvin Daniels is a 48 y.o. male with ESRD, T2DM, hypotension (on midodrine), HIV, Hep C, and prior MRSA R ankle infection who was admitted with R ankle abscess.  Has had ongoing issue with R ankle. Was hospitalized in 06/2018 with MRSA bacteremia related to R foot wound - completed 6 week course of IV Vancomycin in mid- March. Known R Charcot joint and shallow plantar heel ulceration - followed by ortho with last visit 4/9.  Apparently had low-grade fever on Thursday, but felt ok. Was sent to ED from HD center for COVID testing - but afebrile when arrived and d/c home. Reports that his lateral R ankle got more swollen yesterday which prompted ED evaluation. In ED, labs showed Na 138, K 3.6, CRP 29.8, CRP 126, WBC 12, Hgb 8.7, Glu 355. He underwent I&D of lateral R ankle abscess in the ED with "copius amount of purulence" drained. He was admitted for IV antibiotics. Ortho consult pending.  Today, the R ankle is still swollen and fluctuant. Denies fever or chills. Denies CP, dyspnea. Had some diarrhea yesterday. No vomiting.  Dialyzes at [facility] on TTS schedule. He completed HD yesterday prior to admission - next due on Tues 4/21.  Past Medical History:  Diagnosis Date  . AIDS ([place]) 11/22/2014  . Chronic diarrhea   . Chronic hepatitis C without hepatic coma ([place]) 11/22/2014  . Diabetic neuropathy ([place])   . ESRD (end stage renal disease) on dialysis [facility])    "TTS; don't remember street name" (05/03/2014)  . Hepatitis C   . HIV INFECTION 06/27/2010   Qualifier: Diagnosis of  By: Nickola Major CMA ( [place]), Geni Bers    . Hypotension 06/02/2012  . Metabolic bone disease 07/13/4399  . MRSA infection   . Normocytic anemia 06/17/2012  . Pancreatitis   . Pressure ulcer of BKA stump, stage 2 ([name]) 11/22/2014  .  Renal disorder   . Severe protein-calorie malnutrition ([place]) 06/17/2012  . Uncontrolled diabetes mellitus with complications ([place]) 0/27/2536   Annotation: uncontrolled Qualifier: Diagnosis of  By: Nickola Major CMA Deborra Medina), Geni Bers     Past Surgical History:  Procedure Laterality Date  . AMPUTATION Left 04/20/2014   Procedure: 3rd toe amputation, 4th Toe Amputation,  5th Toe Amputation;  Surgeon: Newt Minion, MD;  Location: [place];  Service: Orthopedics;  Laterality: Left;  . AMPUTATION Left 05/02/2014   Procedure: Midfoot Amputation;  Surgeon: Newt Minion, MD;  Location: [place];  Service: Orthopedics;  Laterality: Left;  . AMPUTATION Left 06/17/2014   Procedure: AMPUTATION BELOW KNEE;  Surgeon: Newt Minion, MD;  Location: [place];  Service: Orthopedics;  Laterality: Left;  . AV FISTULA PLACEMENT Left   . AV FISTULA PLACEMENT Left 05/10/2016   Procedure: Creation Left Arm Brachiocephalic Arteriovenous Fistula and Ligation of Radiocephalic Fistula;  Surgeon: Angelia Mould, MD;  Location: [place];  Service: Vascular;  Laterality: Left;  . FEMUR IM NAIL Left 08/17/2016   Procedure: INTRAMEDULLARY (IM) RETROGRADE FEMORAL NAILING;  Surgeon: Marybelle Killings, MD;  Location: [place];  Service: Orthopedics;  Laterality: Left;  . FOOT AMPUTATION THROUGH ANKLE Left 12/'21/2015   midfoot  . IR GENERIC HISTORICAL Left 05/01/2016   IR THROMBECTOMY AV FISTULA W/THROMBOLYSIS/PTA INC/SHUNT/IMG LEFT 05/01/2016 [name], MD [facility]  . IR GENERIC HISTORICAL  05/01/2016   IR US GUIDE VASC ACCESS LEFT 05/01/2016 Quillian Quince  Vernard Gambles, MD [facility]  . IR GENERIC HISTORICAL  05/07/2016   IR FLUORO GUIDE CV LINE RIGHT 05/07/2016 Corrie Mckusick, DO [facility]  . IR GENERIC HISTORICAL  05/07/2016   IR US GUIDE VASC ACCESS RIGHT 05/07/2016 Corrie Mckusick, DO [facility]  . IR GENERIC HISTORICAL  05/22/2016   IR US GUIDE VASC ACCESS RIGHT 05/22/2016 Greggory Keen, MD [facility]  . IR GENERIC HISTORICAL   05/22/2016   IR FLUORO GUIDE CV LINE RIGHT 05/22/2016 Greggory Keen, MD [facility]  . IR REMOVAL TUN CV CATH W/O FL  08/21/2016  . PERIPHERAL VASCULAR CATHETERIZATION Left 05/09/2016   Procedure: A/V Fistulagram;  Surgeon: Angelia Mould, MD;  Location: [place] [facility];  Service: Cardiovascular;  Laterality: Left;  arm  . TEE WITHOUT CARDIOVERSION N/A 06/22/2018   Procedure: TRANSESOPHAGEAL ECHOCARDIOGRAM (TEE);  Surgeon: Jerline Pain, MD;  [facility];  Service: Cardiovascular;  Laterality: N/A;   Family History  Problem Relation Age of Onset  . Diabetes Mother   . Diabetes Father    Social History:  reports that he has never smoked. He has never used smokeless tobacco. He reports that he does not drink alcohol or use drugs.  ROS: As per HPI otherwise negative.  Physical Exam: Vitals:   08/30/18 0045 08/30/18 0052 08/30/18 0121 08/30/18 0502  BP: 104/60  94/69 (!) 86/50  Pulse: 99  (!) 102 95  Resp: 15  18 18   Temp:  98.1 F (36.7 C) 98.1 F (36.7 C) 98.5 F (36.9 C)  TempSrc:  Oral    SpO2: 99%  100% 98%     General: Well developed, well nourished, in no acute distress. Head: Normocephalic, atraumatic, sclera non-icteric, mucus membranes are moist. Neck: Supple without lymphadenopathy/masses. JVD not elevated. Lungs: Clear bilaterally to auscultation without wheezes, rales, or rhonchi. Breathing is unlabored. Heart: RRR with normal S1, S2. No murmurs, rubs, or gallops appreciated. Abdomen: Soft, non-tender, non-distended with normoactive bowel sounds. No rebound/guarding.  Musculoskeletal:  Strength and tone appear normal for age. Lower extremities: L BKA without stump edema. R foot with shallow ulceration on plantar surface of heel. Lateral R ankle with erythema and fluctuance - no expressible pus. Neuro: Alert and oriented X 3. Moves all extremities spontaneously. Psych:  Responds to questions appropriately (in spanish) with a normal affect. Dialysis  Access: L AVF + thrill  No Known Allergies Prior to Admission medications   Medication Sig Start Date End Date Taking? Authorizing Provider  acetaminophen (TYLENOL) 325 MG tablet Take 2 tablets (650 mg total) by mouth every 6 (six) hours as needed for mild pain. 06/23/18   Mariel Aloe, MD  bictegravir-emtricitabine-tenofovir AF (BIKTARVY) 50-200-25 MG TABS tablet Take 1 tablet by mouth daily. 11/19/17   Comer, Okey Regal, MD  insulin NPH-regular Human (70-30) 100 UNIT/ML injection Inject into the skin. Inject 15 units subcutaneous in the a.m. and 10 units subcutaneous in the p.m. with meals    [provider]   Current Facility-Administered Medications  Medication Dose Route Frequency Provider Last Rate Last Dose  . acetaminophen (TYLENOL) tablet 650 mg  650 mg Oral Q6H PRN Etta Quill, DO       Or  . acetaminophen (TYLENOL) suppository 650 mg  650 mg Rectal Q6H PRN Etta Quill, DO      . bictegravir-emtricitabine-tenofovir AF (BIKTARVY) 50-200-25 MG per tablet 1 tablet  1 tablet Oral Daily Jennette Kettle M, DO      . cefTRIAXone (ROCEPHIN)  2 g in sodium chloride 0.9 % 100 mL IVPB  2 g Intravenous Q24H Jennette Kettle M, DO 200 mL/hr at 08/30/18 0628 2 g at 08/30/18 4431  . HYDROcodone-acetaminophen (NORCO/VICODIN) 5-325 MG per tablet 1-2 tablet  1-2 tablet Oral Q6H PRN Etta Quill, DO   2 tablet at 08/30/18 0202  . insulin aspart (novoLOG) injection 0-5 Units  0-5 Units Subcutaneous QHS Alcario Drought, Jared M, DO      . insulin aspart (novoLOG) injection 0-9 Units  0-9 Units Subcutaneous TID WC Etta Quill, DO      . insulin glargine (LANTUS) injection 5 Units  5 Units Subcutaneous BID Etta Quill, DO   5 Units at 08/30/18 0230  . lidocaine (PF) (XYLOCAINE) 1 % injection 10 mL  10 mL Infiltration Once Andee Poles, MD      . metroNIDAZOLE (FLAGYL) IVPB 500 mg  500 mg Intravenous Q8H Alcario Drought, Jared M, DO      . ondansetron [facility]) tablet 4 mg  4 mg Oral Q6H  PRN Etta Quill, DO       Or  . ondansetron [facility]) injection 4 mg  4 mg Intravenous Q6H PRN Etta Quill, DO      . [START ON 09/01/2018] vancomycin (VANCOCIN) IVPB 500 mg/100 ml premix  500 mg Intravenous Q T,Th,Sa-HD Theotis Burrow, [facility]       Labs: Basic Metabolic Panel: Recent Labs  Lab 08/27/18 0711 08/29/18 2202 08/30/18 0457  NA 132* 138 136  K 4.1 3.6 3.7  CL 94* 100 100  CO2 21* 24 22  GLUCOSE 297* 355* 199*  BUN 45* 35* 38*  CREATININE 10.89* 7.59* 8.01*  CALCIUM 8.8* 8.2* 8.0*   Liver Function Tests: Recent Labs  Lab 08/27/18 0711 08/29/18 2202  AST 15 18  ALT 9 9  ALKPHOS 80 80  BILITOT 0.7 1.0  PROT 9.0* 7.6  ALBUMIN 2.5* 2.3*   CBC: Recent Labs  Lab 08/27/18 0711 08/29/18 2202 08/30/18 0457  WBC 6.0 12.0* 10.3  NEUTROABS 3.9 11.2*  --   HGB 9.0* 8.7* 7.5*  HCT 29.7* 28.8* 24.8*  MCV 95.2 95.4 95.0  PLT 246 240 199   CBG: Recent Labs  Lab 08/30/18 0711  GLUCAP 183*   Studies/Results: Ct Ankle Right W Contrast  Result Date: 08/29/2018 CLINICAL DATA:  Ankle swelling and pain EXAM: CT OF THE RIGHT ANKLE WITH CONTRAST TECHNIQUE: Multidetector CT imaging of the right ankle was performed following the standard protocol during bolus administration of intravenous contrast. CONTRAST:  166mL OMNIPAQUE IOHEXOL 300 MG/ML  SOLN COMPARISON:  06/18/2018, 06/19/2018 FINDINGS: Bones/Joint/Cartilage The distal tibia and fibula are within normal limits. Chronically fragmented calcaneus is noted with significant bony resorption although the appearance is similar to that seen on prior plain film examination. Some lucency is noted in the distal talus although no definitive fracture is seen. The remainder of the tarsal bones and visualized metatarsals appear within normal limits. Ligaments Suboptimally assessed by CT. Muscles and Tendons No discrete muscular abnormality is noted. Soft tissues There is an elongated fluid collection identified laterally which  measures approximately 5.9 by 3.1 cm in greatest dimension. This extends for approximately 8 cm in craniocaudad projection. Multiple foci of air are noted within consistent with abscess. Similar appearing fluid collection is noted interposed between the fragments of the calcaneus also consistent with abscess. Some areas of bony lucency are noted in the fragments of the calcaneus. Underlying osteomyelitis would be difficult to exclude. Smaller fluid  collections are noted along the medial aspect of the talus measuring less than 1 cm. Additionally at least 2 small fluid collections are noted along the plantar aspect of the tarsal bones each measuring just over 1 cm in greatest dimension. IMPRESSION: Multifocal abscesses surrounding the ankle joint with the largest collections lying laterally adjacent to the distal fibula and lateral aspect of the calcaneal fragments as well as a second large collection with peripheral enhancement interposed between the fragments of the calcaneus. Communication with the tibiotalar and talofibular joints is not appreciated on this exam. Smaller fluid collections with enhancing margins representing smaller abscesses are noted along the plantar aspect of the foot. Underlying osteomyelitis involving the fragments of the calcaneus would be difficult to exclude given some degree of resorption. Electronically Signed   By: Inez Catalina M.D.   On: 08/29/2018 23:14   Dialysis Orders:  TTS at [facility] 4hr, 400/A1.5, EDW 52kg, 2K/2Ca, Linear Na, AVF, heparin 2000 bolus - Hectoral 76mcg IV q HD - Just finished course of Venofer - Mircera 162mcg IV q 2 weeks (last given 4/7)  Assessment/Plan: 1.  R ankle abscess: Prior osteo in same foot 06/2018. S/p I&D in ED - Blood and wound Cx pending. Site is fluctuant again. Ortho consult pending. On Vanc/Zosyn/Flagyl. 2.  ESRD:  Will continue HD per usual TTS schedule - next due 4/21. 3.  Hypotension/volume: BP chronically low, uses midodrine  pre-HD. 4.  Anemia: Hgb 7.5 - will be due for ESA with next HD - follow labs, transfuse prn. 5.  Metabolic bone disease: CorrCa ok, Phos pending. Continue home meds. 6.  T2DM: Per primary. 7.  HIV 8.  Hep C  Veneta Penton, PA-C 08/30/2018, 10:02 AM  [facility] Pager: [phone]  Pt seen, examined and agree w A/P as above.  [facility] 08/30/2018, 7:29 PM

## 2018-08-31 ENCOUNTER — Encounter (HOSPITAL_COMMUNITY): Payer: Self-pay

## 2018-08-31 ENCOUNTER — Inpatient Hospital Stay (HOSPITAL_COMMUNITY): Payer: Medicaid Other

## 2018-08-31 DIAGNOSIS — L02611 Cutaneous abscess of right foot: Secondary | ICD-10-CM

## 2018-08-31 DIAGNOSIS — Z992 Dependence on renal dialysis: Secondary | ICD-10-CM

## 2018-08-31 DIAGNOSIS — N186 End stage renal disease: Secondary | ICD-10-CM

## 2018-08-31 DIAGNOSIS — Z79899 Other long term (current) drug therapy: Secondary | ICD-10-CM

## 2018-08-31 DIAGNOSIS — B1920 Unspecified viral hepatitis C without hepatic coma: Secondary | ICD-10-CM

## 2018-08-31 DIAGNOSIS — E11610 Type 2 diabetes mellitus with diabetic neuropathic arthropathy: Secondary | ICD-10-CM

## 2018-08-31 DIAGNOSIS — E1122 Type 2 diabetes mellitus with diabetic chronic kidney disease: Secondary | ICD-10-CM

## 2018-08-31 DIAGNOSIS — Z89512 Acquired absence of left leg below knee: Secondary | ICD-10-CM

## 2018-08-31 DIAGNOSIS — Z21 Asymptomatic human immunodeficiency virus [HIV] infection status: Secondary | ICD-10-CM

## 2018-08-31 DIAGNOSIS — B9561 Methicillin susceptible Staphylococcus aureus infection as the cause of diseases classified elsewhere: Secondary | ICD-10-CM

## 2018-08-31 DIAGNOSIS — R7881 Bacteremia: Secondary | ICD-10-CM

## 2018-08-31 DIAGNOSIS — B9562 Methicillin resistant Staphylococcus aureus infection as the cause of diseases classified elsewhere: Secondary | ICD-10-CM

## 2018-08-31 LAB — BASIC METABOLIC PANEL
Anion gap: 13 (ref 5–15)
BUN: 54 mg/dL — ABNORMAL HIGH (ref 6–20)
CO2: 22 mmol/L (ref 22–32)
Calcium: 8.8 mg/dL — ABNORMAL LOW (ref 8.9–10.3)
Chloride: 99 mmol/L (ref 98–111)
Creatinine, Ser: 9.89 mg/dL — ABNORMAL HIGH (ref 0.61–1.24)
GFR calc Af Amer: 6 mL/min — ABNORMAL LOW (ref >60)
GFR calc non Af Amer: 6 mL/min — ABNORMAL LOW (ref >60)
Glucose, Bld: 151 mg/dL — ABNORMAL HIGH (ref 70–99)
Potassium: 4 mmol/L (ref 3.5–5.1)
Sodium: 134 mmol/L — ABNORMAL LOW (ref 135–145)

## 2018-08-31 LAB — GLUCOSE, CAPILLARY
Glucose-Capillary: 172 mg/dL — ABNORMAL HIGH (ref 70–99)
Glucose-Capillary: 165 mg/dL — ABNORMAL HIGH (ref 70–99)
Glucose-Capillary: 126 mg/dL — ABNORMAL HIGH (ref 70–99)
Glucose-Capillary: 155 mg/dL — ABNORMAL HIGH (ref 70–99)
Glucose-Capillary: 200 mg/dL — ABNORMAL HIGH (ref 70–99)
Glucose-Capillary: 116 mg/dL — ABNORMAL HIGH (ref 70–99)

## 2018-08-31 LAB — CBC
HCT: 25 % — ABNORMAL LOW (ref 39.0–52.0)
Hemoglobin: 7.8 g/dL — ABNORMAL LOW (ref 13.0–17.0)
MCH: 29.5 pg (ref 26.0–34.0)
MCHC: 31.2 g/dL (ref 30.0–36.0)
MCV: 94.7 fL (ref 80.0–100.0)
Platelets: 204 10*3/uL (ref 150–400)
RBC: 2.64 MIL/uL — ABNORMAL LOW (ref 4.22–5.81)
RDW: 15.2 % (ref 11.5–15.5)
WBC: 9.2 10*3/uL (ref 4.0–10.5)
nRBC: 0 % (ref 0.0–0.2)

## 2018-08-31 MED ORDER — PRO-STAT SUGAR FREE PO LIQD
30.0000 mL | Freq: Two times a day (BID) | ORAL | Status: DC
  Administered 2018-08-31 – 2018-09-07 (×13): 30 mL via ORAL
  Filled 2018-08-31 (×12): qty 30

## 2018-08-31 MED ORDER — CHLORHEXIDINE GLUCONATE CLOTH 2 % EX PADS
6.0000 | MEDICATED_PAD | Freq: Every day | CUTANEOUS | Status: DC
  Administered 2018-08-31 – 2018-09-04 (×3): 6 via TOPICAL

## 2018-08-31 MED ORDER — RENA-VITE PO TABS
1.0000 | ORAL_TABLET | Freq: Every day | ORAL | Status: DC
  Administered 2018-08-31 – 2018-09-06 (×7): 1 via ORAL
  Filled 2018-08-31 (×7): qty 1

## 2018-08-31 MED ORDER — HEPARIN SODIUM (PORCINE) 5000 UNIT/ML IJ SOLN
5000.0000 [IU] | Freq: Three times a day (TID) | INTRAMUSCULAR | Status: DC
  Administered 2018-08-31 – 2018-09-07 (×16): 5000 [IU] via SUBCUTANEOUS
  Filled 2018-08-31 (×17): qty 1

## 2018-08-31 NOTE — Consult Note (Signed)
[facility] for Infectious Disease    Date of Admission:  08/29/2018     Total days of antibiotics: 3  Day 3 vancomycin   (Previously dosed with Zosyn)        Reason for Consult: MRSA Bacteremia     Referring Provider: CHAMP Primary Care Provider: [name], MD   Assessment: Alvin Daniels is a 48 y.o. male with HIV (previously well controlled), ESRD on HD and uncontrolled diabetes with chronic ulcer of the right foot in the setting of Charcot deformity with underlying abscess. He is readmitted with second bacteremia 75m after stopping therapy previously in March of this year. His abscess was partially drained in the ER with preliminary culture showing Staphylococcus aureus with sensitivities still pending.  He is bacteremic again with MRSA in both sets of blood cultures.  For his staphylococcus aureus bacteremia he will need a transthoracic echocardiogram to assess for endocarditis - on exam he has no murmur, L AVF appears to be normal and w/o signs of infection. No artificial joints or other hardware and no signs/symptoms of other metastatic sites of infection. Source most likely related to chronic R ankle/foot infection. Repeat blood cultures ordered to prove clearance.  Continue vancomycin alone with HD.  Considering this is his second bacteremic episode in the setting of presumably the same source and having recently completed a long course of vancomycin, we may need to reevaluate for more aggressive surgical approach.  His CRP is back up to 29 which is concerning. Will check MRI of the foot to assess further.   His last hemoglobin A1c was 9 in February, his blood sugars have been above 200-300 since admission.  Would repeat vascular studies to ensure he has the appropriate ability to heal.   Continue Biktarvy once a day.  We will repeat his viral load and CD4 count to reassess treatment.   Plan: 1. Continue vancomycin per pharmacy   2. Repeat blood cultures  now 3. Please check TTE  4. Agree to repeat vascular studies - ABIs ordered 5. MRI of the foot w/o contrast to assess 6. Continue Biktarvy once daily - please take care to separate from any multivitamins, iron, antacids by at least 4 hours.   Principal Problem:   MRSA bacteremia Active Problems:   Abscess of ankle   ESRD on dialysis ([place])   HIV disease ([place])   Diabetes mellitus with ESRD (end-stage renal disease) ([place])   Chronic diarrhea   Charcot foot due to diabetes mellitus ([place])   . bictegravir-emtricitabine-tenofovir AF  1 tablet Oral Daily  . Chlorhexidine Gluconate Cloth  6 each Topical Q0600  . [START ON 09/01/2018] darbepoetin (ARANESP) injection - DIALYSIS  200 mcg Intravenous Q Tue-HD  . feeding supplement (PRO-STAT SUGAR FREE 64)  30 mL Oral BID  . heparin  5,000 Units Subcutaneous Q8H  . insulin aspart  0-5 Units Subcutaneous QHS  . insulin aspart  0-9 Units Subcutaneous TID WC  . insulin glargine  5 Units Subcutaneous BID  . lidocaine (PF)  10 mL Infiltration Once  . [START ON 09/01/2018] midodrine  10 mg Oral Once per day on Tue Thu Sat  . multivitamin  1 tablet Oral QHS  . [START ON 09/01/2018] vancomycin  500 mg Intravenous Q T,Th,Sa-HD    HPI: Alvin Daniels is a 48 y.o. 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   **Stratus interpreter was used to facilitate visit.  Admitted on 2/04 - 2/11 for MRSA bacteremia. 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 Last dose was March 19th. He last saw Dr. Tommy Medal in the ID clinic on 3/17 where he was doing well. He has also been following closely with Dr. Sharol Given and revealed improvement in his wound. Office visit with ortho  team on 4/09 revealed an open ulcer that was clean with no evidence of infection but had localized edema about his ankle.   Presented again to the ER on 4/16 with fever - CXR normal, no other signs of sepsis at that time and was d/c'd home. Returned again on 4/18 with complaints of further swelling of the right ankle over the last 15 days. Cultures drawn (includign superficial wound) and septic work up revealed MRSA again with blood cultures and wound from R foot abscess revealing staphylococcus aureus with sensitivities pending. Regarding antimicrobial treatment, he was started on vancomycin and ceftriaxone and metronidazole IV.   CT scan R Foot - Multifocal abscesses surrounding the ankle joint with the largest collections lying laterally adjacent to the distal fibula and lateral aspect of the calcaneal fragments as well as a second large collection with peripheral enhancement interposed between the fragments of the calcaneus. Communication with the tibiotalar and talofibular joints is not appreciated on this exam. Smaller fluid collections with enhancing margins representing smaller abscesses are noted along the plantar aspect of the foot.  He continues on his Biktarvy once daily to treat his HIV which has been under very good control (July 2019 VL<20/[id number]).  He tells me he takes all of his medications regularly and does not skip any doses.  He is worried that he has not been getting his Biktarvy since admission to the hospital and would like to clarify.  He is worried he has gangrene and will lose his right foot.  He would like very much to save this if he can.  He tells me he has diabetes and blood sugars at home in the morning usually 1 20-1 30.  He says he has the spot checked at dialysis also and are "good."  His blood sugar since admission to the hospital have been persistently above 250 and up into the 400's.  He is having some pain in the right foot, normally he is insensate.  He was not having  fevers per his account prior to admission.  Since the doctor in the emergency room drained some of his abscess in the ankle he has had ongoing trouble with bleeding of the site.  Review of Systems: Review of Systems  Constitutional: Positive for fever. Negative for chills and malaise/fatigue.  HENT: Negative for sore throat.   Respiratory: Negative for cough, shortness of breath and wheezing.   Cardiovascular: Positive for leg swelling. Negative for chest pain.  Gastrointestinal: Negative for abdominal pain, diarrhea and vomiting.  Genitourinary:       Anuric   Musculoskeletal: Positive for joint pain (R foot pain ). Negative for back pain, myalgias and neck pain.  Skin: Negative for rash.  Neurological: Negative for dizziness and headaches.    Past Medical History:  Diagnosis Date  . AIDS ([place]) 11/22/2014  . Chronic diarrhea   . Chronic hepatitis C without hepatic coma ([place]) 11/22/2014  .  Diabetic neuropathy ([place])   . ESRD (end stage renal disease) on dialysis [facility])    "TTS; don't remember street name" (05/03/2014)  . Hepatitis C   . HIV INFECTION 06/27/2010   Qualifier: Diagnosis of  By: Nickola Major CMA ( [place]), Geni Bers    . Hypotension 06/02/2012  . Metabolic bone disease 12/12/5001  . MRSA infection   . Normocytic anemia 06/17/2012  . Pancreatitis   . Pressure ulcer of BKA stump, stage 2 ([place]) 11/22/2014  . Renal disorder   . Severe protein-calorie malnutrition ([place]) 06/17/2012  . Uncontrolled diabetes mellitus with complications ([place]) 11/14/8887   Annotation: uncontrolled Qualifier: Diagnosis of  By: Nickola Major CMA Deborra Medina), Geni Bers      Social History   Tobacco Use  . Smoking status: Never Smoker  . Smokeless tobacco: Never Used  Substance Use Topics  . Alcohol use: No  . Drug use: No    Family History  Problem Relation Age of Onset  . Diabetes Mother   . Diabetes Father    No Known Allergies  OBJECTIVE: Blood pressure 94/60, pulse 92, temperature 98 F (36.7 C),  temperature source Oral, resp. rate 18, weight 53.4 kg, SpO2 100 %.  Physical Exam Constitutional:      General: He is not in acute distress.    Appearance: He is not ill-appearing.  Cardiovascular:     Rate and Rhythm: Normal rate.     Pulses: Normal pulses.     Heart sounds: No murmur.  Pulmonary:     Effort: Pulmonary effort is normal. No respiratory distress.     Breath sounds: Normal breath sounds. No wheezing.  Abdominal:     Palpations: Abdomen is soft.     Tenderness: There is no abdominal tenderness.  Musculoskeletal:     Comments: L BKA R foot with Charcot deformity and swelling throughout foot and around lateral malleolus. There is overlying clotted drainage to this site with Aquaell Ag dressing w/o much drainage. There is no expressed purulence with palpation. Plantar calcaneal wound open with surrounding erythema. No definitive palpation to bone.   Skin:    General: Skin is warm and dry.     Capillary Refill: Capillary refill takes less than 2 seconds.     Comments: L AVF +bruit/+thrill Multiple healed lesions from previous ulcerations/wound to RLE.   Neurological:     Mental Status: He is alert and oriented to person, place, and time.     Lab Results Lab Results  Component Value Date   WBC 9.2 08/31/2018   HGB 7.8 (L) 08/31/2018   HCT 25.0 (L) 08/31/2018   MCV 94.7 08/31/2018   PLT 204 08/31/2018    Lab Results  Component Value Date   CREATININE 9.89 (H) 08/31/2018   BUN 54 (H) 08/31/2018   NA 134 (L) 08/31/2018   K 4.0 08/31/2018   CL 99 08/31/2018   CO2 22 08/31/2018    Lab Results  Component Value Date   ALT 9 08/29/2018   AST 18 08/29/2018   ALKPHOS 80 08/29/2018   BILITOT 1.0 08/29/2018    Sed Rate (mm/hr)  Date Value  08/29/2018 126 (H)  08/15/2016 132 (H)  06/10/2014 84 (H)   CRP (mg/dL)  Date Value  08/29/2018 29.8 (H)  08/15/2016 28.3 (H)  04/18/2014 17.8 (H)   HIV 1 RNA Quant (copies/mL)  Date Value  11/19/2017 <20 NOT  DETECTED  05/15/2017 <20 NOT DETECTED  07/24/2016 <20 NOT DETECTED   CD4 T Cell Abs (/uL)  Date Value  11/19/2017 270 (L)  05/15/2017 320 (L)  07/24/2016 350 (L)   Lab Results  Component Value Date   HGBA1C 9.0 (H) 06/18/2018    Microbiology: Recent Results (from the past 240 hour(s))  Blood culture (routine x 2)     Status: Abnormal (Preliminary result)   Collection Time: 08/29/18 10:10 PM  Result Value Ref Range Status   Specimen Description BLOOD RIGHT WRIST  Final   Special Requests   Final    BOTTLES DRAWN AEROBIC ONLY Blood Culture results may not be optimal due to an inadequate volume of blood received in culture bottles   Culture  Setup Time   Final    GRAM POSITIVE COCCI IN CLUSTERS AEROBIC BOTTLE ONLY CRITICAL RESULT CALLED TO, READ BACK BY AND VERIFIED WITH: Reuel Boom [id number] FCP Performed at [facility], [place] [address], [place], [place] [address]    Culture STAPHYLOCOCCUS AUREUS (A)  Final   Report Status PENDING  Incomplete  Blood Culture ID Panel (Reflexed)     Status: Abnormal   Collection Time: 08/29/18 10:10 PM  Result Value Ref Range Status   Enterococcus species NOT DETECTED NOT DETECTED Final   Listeria monocytogenes NOT DETECTED NOT DETECTED Final   Staphylococcus species DETECTED (A) NOT DETECTED Final    Comment: CRITICAL RESULT CALLED TO, READ BACK BY AND VERIFIED WITH: PHARMD PIERCE, D 1723 [id number] FCP    Staphylococcus aureus (BCID) DETECTED (A) NOT DETECTED Final    Comment: Methicillin (oxacillin)-resistant Staphylococcus aureus (MRSA). MRSA is predictably resistant to beta-lactam antibiotics (except ceftaroline). Preferred therapy is vancomycin unless clinically contraindicated. Patient requires contact precautions if  hospitalized. CRITICAL RESULT CALLED TO, READ BACK BY AND VERIFIED WITH: PHARMD PIERCE, D 1723 [id number] FCP    Methicillin resistance DETECTED (A) NOT DETECTED Final    Comment: CRITICAL RESULT CALLED TO, READ  BACK BY AND VERIFIED WITH: PHARMD PIERCE, D 1723 [id number] FCP    Streptococcus species NOT DETECTED NOT DETECTED Final   Streptococcus agalactiae NOT DETECTED NOT DETECTED Final   Streptococcus pneumoniae NOT DETECTED NOT DETECTED Final   Streptococcus pyogenes NOT DETECTED NOT DETECTED Final   Acinetobacter baumannii NOT DETECTED NOT DETECTED Final   Enterobacteriaceae species NOT DETECTED NOT DETECTED Final   Enterobacter cloacae complex NOT DETECTED NOT DETECTED Final   Escherichia coli NOT DETECTED NOT DETECTED Final   Klebsiella oxytoca NOT DETECTED NOT DETECTED Final   Klebsiella pneumoniae NOT DETECTED NOT DETECTED Final   Proteus species NOT DETECTED NOT DETECTED Final   Serratia marcescens NOT DETECTED NOT DETECTED Final   Haemophilus influenzae NOT DETECTED NOT DETECTED Final   Neisseria meningitidis NOT DETECTED NOT DETECTED Final   Pseudomonas aeruginosa NOT DETECTED NOT DETECTED Final   Candida albicans NOT DETECTED NOT DETECTED Final   Candida glabrata NOT DETECTED NOT DETECTED Final   Candida krusei NOT DETECTED NOT DETECTED Final   Candida parapsilosis NOT DETECTED NOT DETECTED Final   Candida tropicalis NOT DETECTED NOT DETECTED Final    Comment: Performed at [facility], [place]. [name], [place], [place] [address]  Blood culture (routine x 2)     Status: None (Preliminary result)   Collection Time: 08/29/18 10:16 PM  Result Value Ref Range Status   Specimen Description BLOOD RIGHT HAND  Final   Special Requests   Final    BOTTLES DRAWN AEROBIC ONLY Blood Culture results may not be optimal due to an inadequate volume of blood received in culture  bottles   Culture  Setup Time   Final    GRAM POSITIVE COCCI AEROBIC BOTTLE ONLY Organism ID to follow CRITICAL RESULT CALLED TO, READ BACK BY AND VERIFIED WITH: Marland Mcalpine [id number] MLM Performed at [facility], [address] [place], [place] [address]    Culture GRAM POSITIVE COCCI  Final   Report  Status PENDING  Incomplete  Blood Culture ID Panel (Reflexed)     Status: Abnormal   Collection Time: 08/29/18 10:16 PM  Result Value Ref Range Status   Enterococcus species NOT DETECTED NOT DETECTED Final   Listeria monocytogenes NOT DETECTED NOT DETECTED Final   Staphylococcus species DETECTED (A) NOT DETECTED Final    Comment: CRITICAL RESULT CALLED TO, READ BACK BY AND VERIFIED WITH: PHARMD J [place] [id number] 1808 MLM    Staphylococcus aureus (BCID) DETECTED (A) NOT DETECTED Final    Comment: Methicillin (oxacillin)-resistant Staphylococcus aureus (MRSA). MRSA is predictably resistant to beta-lactam antibiotics (except ceftaroline). Preferred therapy is vancomycin unless clinically contraindicated. Patient requires contact precautions if  hospitalized. CRITICAL RESULT CALLED TO, READ BACK BY AND VERIFIED WITH: PHARMD J [place] [id number] 1808 MLM    Methicillin resistance DETECTED (A) NOT DETECTED Final    Comment: CRITICAL RESULT CALLED TO, READ BACK BY AND VERIFIED WITH: PHARMD J [place] [id number] 1808 MLM    Streptococcus species NOT DETECTED NOT DETECTED Final   Streptococcus agalactiae NOT DETECTED NOT DETECTED Final   Streptococcus pneumoniae NOT DETECTED NOT DETECTED Final   Streptococcus pyogenes NOT DETECTED NOT DETECTED Final   Acinetobacter baumannii NOT DETECTED NOT DETECTED Final   Enterobacteriaceae species NOT DETECTED NOT DETECTED Final   Enterobacter cloacae complex NOT DETECTED NOT DETECTED Final   Escherichia coli NOT DETECTED NOT DETECTED Final   Klebsiella oxytoca NOT DETECTED NOT DETECTED Final   Klebsiella pneumoniae NOT DETECTED NOT DETECTED Final   Proteus species NOT DETECTED NOT DETECTED Final   Serratia marcescens NOT DETECTED NOT DETECTED Final   Haemophilus influenzae NOT DETECTED NOT DETECTED Final   Neisseria meningitidis NOT DETECTED NOT DETECTED Final   Pseudomonas aeruginosa NOT DETECTED NOT DETECTED Final   Candida albicans NOT DETECTED NOT DETECTED  Final   Candida glabrata NOT DETECTED NOT DETECTED Final   Candida krusei NOT DETECTED NOT DETECTED Final   Candida parapsilosis NOT DETECTED NOT DETECTED Final   Candida tropicalis NOT DETECTED NOT DETECTED Final    Comment: Performed at [facility], [address] [place], [place] [address]  Wound or Superficial Culture     Status: None (Preliminary result)   Collection Time: 08/30/18 12:37 AM  Result Value Ref Range Status   Specimen Description ABSCESS  Final   Special Requests RIGHT FOOT  Final   Gram Stain   Final    FEW WBC PRESENT, PREDOMINANTLY PMN NO ORGANISMS SEEN    Culture   Final    FEW STAPHYLOCOCCUS AUREUS SUSCEPTIBILITIES TO FOLLOW Performed at [facility], [place] [address] [place], [place] [address]    Report Status PENDING  Incomplete    Janene Madeira, MSN, NP-C [place] [facility] [place] Cell: [phone] Pager: [phone]  08/31/2018 4:18 PM

## 2018-08-31 NOTE — Consult Note (Addendum)
[facility] Nurse wound consult note Reason for Consult: Consult requested for right ankle wound.  Reviewed photos and progress notes in the EMR.   Wound type: Full thickness wound to right outer ankle, red and moist with large amt tan drainage. Pt has been followed by Dr Sharol Given in the past (Feb 2020).  CT scan results indicate: "Multifocal abscesses surrounding the ankle joint with the largest collections lying laterally adjacent to the distal fibula and lateral aspect of the calcaneal fragments as well as a second large collection with peripheral enhancement interposed between the fragments of the calcaneus. Communication with the tibiotalar and talofibular joints is not appreciated on this exam. Smaller fluid collections with enhancing margins representing smaller abscesses are noted along the plantar aspect of the foot."  This complex medical condition is beyond the scope of practice for [facility] nurses.  Please refer to ortho team for further plan of care.   Dressing procedure/placement/frequency: Topical treatment orders provided for bedside nurse to perform: Aquacel to absorb drainage and provide antimicrobial benefits until further recommendations are available from the ortho service.  Please re-consult if further assistance is needed.  Thank-you,  Julien Girt MSN, [place], [place], [place], [place]

## 2018-08-31 NOTE — Progress Notes (Addendum)
Initial Nutrition Assessment  RD working remotely.  DOCUMENTATION CODES:   Not applicable  INTERVENTION:   - Continue ProStat BID (each provides 100 kcal, 15 g protein)   NUTRITION DIAGNOSIS:   Increased nutrient needs related to wound healing as evidenced by estimated needs.  GOAL:   Patient will meet greater than or equal to 90% of their needs  MONITOR:   PO intake, Supplement acceptance, Skin, Weight trends, Labs  REASON FOR ASSESSMENT:   Consult Wound healing  ASSESSMENT:   48 yo male, admitted with ankle abscess. PMH significant for hepatitis C, MRSA, pancreatitis, chronic diarrhea, HIV, AIDS, hypotension, severe PCM, diabetic neuropathy, DM, ESRD on HD, stage 2 pressure ulcer of BKA stump.  RD spoke to RN who utilized iStratus in room to communicate with patient. Pt has a good, normal appetite. Expressed concern over receiving medication with no food, which makes him nausea and yesterday he vomited. This morning, he was able to keep his food down and ate a good breakfast. Encouraged him to take all ProStat as they are important for wound healing. Will continue to monitor adequacy of intake.  Labs: sodium 134 (L), glucose 151 mg/dL, BUN 54 (H), Creatinine 9.89 (H), Hgb 7.8 (L) Meds: Novolog, Lantus, vancomycin  NUTRITION - FOCUSED PHYSICAL EXAM: Deferred - RD working remotely  Diet Order:  No known food allergies Diet Order            Diet renal/carb modified with fluid restriction Diet-HS Snack? Nothing; Fluid restriction: 1200 mL Fluid; Room service appropriate? Yes; Fluid consistency: Thin  Diet effective now            PO Intake 90-100% x 3 meals recorded on 4/19 No meals recorded yet today  EDUCATION NEEDS:  Education needs have been addressed  Skin:  Skin Assessment: Skin Integrity Issues: Skin Integrity Issues:: Other (Comment) Other: Abcess on R ankel  Last BM:  4/19, type 7  Height:  Ht Readings from Last 1 Encounters:  08/20/18 5' (1.524  m)    Weight:  Wt Readings from Last 10 Encounters:  08/31/18 53.4 kg  08/20/18 54.9 kg  07/28/18 54.9 kg  07/23/18 54.9 kg  07/06/18 54.9 kg  06/23/18 51.8 kg  11/19/17 50.8 kg  Wt trending up x 2 months - ?actual vs fluid  Net +1.5 L fluid since admit Noted significant +4 edema of RLE  Last HD PTA on 4/18  Ideal Body Weight:  45.4 kg(adjusted for BKA)  BMI:  Body mass index is 22.99 kg/m. normal  Estimated Nutritional Needs:   Kcal:  1590-1855 (30-35 kcal/kg)  Protein:  80-95 gm (1.5-1.8 g/kg)  Fluid:  1200 mL per MD  Althea Grimmer, MS, RDN, LDN Pager: [phone] Available Mondays and Fridays, 9am-2pm

## 2018-08-31 NOTE — Progress Notes (Addendum)
PROGRESS NOTE  Alvin Daniels URK:[id number] DOB: 10/07/1970 DOA: 08/29/2018 PCP: [name], MD  HPI/Recap of past 24 hours: All communication done through online Spanish interpreter.  Patient is a 48 year old Spanish-speaking male with past medical history of end-stage renal disease from diabetes mellitus uncontrolled, HIV, HCV status post left BKA.  Patient had a previous hospitalization 2 months ago due to sepsis from MRSA bacteremia suspected to be from right foot infection.  At that time, no osteo-noted on MRI but there was question of abscess.  Patient presented to the emergency room on the evening of Saturday, 4/18 with 2 weeks of swelling of his right ankle.  He also reported a fever.  He had missed his dialysis session on Thursday, the 16th, but did attend his session on the 18th.  CT scan noted abscesses of the right ankle.  Patient underwent a bedside I&D with large amount of purulent drainage.  Started on broad-spectrum antibiotics and admitted to the hospitalist service.  Orthopedics consulted and evaluation films did not feel that there was any communication with the joint.  Blood cultures came back positive on the evening of 4/19 for MRSA.  Infectious disease consulted.  Today, patient is doing okay.  Complains of some right ankle pain.  Seen by nephrology with plans for dialysis tomorrow.  Seen by ID who recommends continuing vancomycin and discontinuing other antibiotics which has been done.  Assessment/Plan: Principal Problem:   Abscess of R ankle with MRSA bacteremia: No evidence of sepsis.  Discontinue other antibiotics and continue vancomycin.  ID following.  Appears to be stable.  If blood cultures do not clear, then will reimage and ask orthopedics to reevaluate.  Orthopedics at this time feels there is no communication with the joint area and does not need any further surgery. Active Problems:   Diabetes mellitus with ESRD (end-stage renal disease) ([place]): Seen by  nephrology with plans for dialysis tomorrow.  Follow sliding scale.   Chronic diarrhea   HIV disease [facility]): Being followed by infectious disease.  Patient has not had a viral count in some time, labs being checked now   Charcot foot due to diabetes mellitus ([place])   Code Status: Full code  Family Communication: Left message for family  Disposition Plan: Likely will be here for several days once we have confirmed that his infection is improved and we have a antibiotic plan in place   Consultants:  Orthopedics  Infectious disease  Nephrology  Procedures: I&D of right ankle done by emergency room physician  Antimicrobials:  IV Flagyl and Rocephin 4/19- 4/20  IV vancomycin 4/19-present  DVT prophylaxis: Subcu heparin   Objective: Vitals:   08/31/18 0547 08/31/18 0938  BP: 110/69 94/60  Pulse: 83 92  Resp: 18 18  Temp: 97.7 F (36.5 C) 98 F (36.7 C)  SpO2: 99% 100%    Intake/Output Summary (Last 24 hours) at 08/31/2018 1412 Last data filed at 08/31/2018 1300 Gross per 24 hour  Intake 600 ml  Output 0 ml  Net 600 ml   Filed Weights   08/30/18 1700 08/30/18 2100 08/31/18 0428  Weight: 53.4 kg 53.4 kg 53.4 kg   Body mass index is 22.99 kg/m.  Exam:   General: Alert and oriented x3, no acute distress  HEENT: Normocephalic and atraumatic, mucous membranes are slightly dry  Neck: Supple, no JVD  Cardiovascular: Regular rate and rhythm, S1-S2  Respiratory: Clear to auscultation bilaterally  Abdomen: Soft, nontender, nondistended, positive bowel sounds  Musculoskeletal: Status post left  BKA.  Right lower extremity is 2+ pitting edema from the knees down with tenderness at the external right malleolar area covered with a wound dressing.  Skin is warm  Skin: Charcot foot on right with associated skin changes  Psychiatry: Appropriate, no evidence of psychoses   Reviewed: CBC: Recent Labs  Lab 08/27/18 0711 08/29/18 2202 08/30/18 0457 08/31/18  0605  WBC 6.0 12.0* 10.3 9.2  NEUTROABS 3.9 11.2*  --   --   HGB 9.0* 8.7* 7.5* 7.8*  HCT 29.7* 28.8* 24.8* 25.0*  MCV 95.2 95.4 95.0 94.7  PLT 246 240 199 782   Basic Metabolic Panel: Recent Labs  Lab 08/27/18 0711 08/29/18 2202 08/30/18 0457 08/31/18 0605  NA 132* 138 136 134*  K 4.1 3.6 3.7 4.0  CL 94* 100 100 99  CO2 21* 24 22 22   GLUCOSE 297* 355* 199* 151*  BUN 45* 35* 38* 54*  CREATININE 10.89* 7.59* 8.01* 9.89*  CALCIUM 8.8* 8.2* 8.0* 8.8*   GFR: Estimated Creatinine Clearance: 6.5 mL/min (A) (by C-G formula based on SCr of 9.89 mg/dL (H)). Liver Function Tests: Recent Labs  Lab 08/27/18 0711 08/29/18 2202  AST 15 18  ALT 9 9  ALKPHOS 80 80  BILITOT 0.7 1.0  PROT 9.0* 7.6  ALBUMIN 2.5* 2.3*   No results for input(s): LIPASE, AMYLASE in the last 168 hours. No results for input(s): AMMONIA in the last 168 hours. Coagulation Profile: No results for input(s): INR, PROTIME in the last 168 hours. Cardiac Enzymes: No results for input(s): CKTOTAL, CKMB, CKMBINDEX, TROPONINI in the last 168 hours. BNP (last 3 results) No results for input(s): PROBNP in the last 8760 hours. HbA1C: No results for input(s): HGBA1C in the last 72 hours. CBG: Recent Labs  Lab 08/30/18 1626 08/30/18 2107 08/31/18 0347 08/31/18 0729 08/31/18 1153  GLUCAP 213* 195* 165* 126* 155*   Lipid Profile: No results for input(s): CHOL, HDL, LDLCALC, TRIG, CHOLHDL, LDLDIRECT in the last 72 hours. Thyroid Function Tests: No results for input(s): TSH, T4TOTAL, FREET4, T3FREE, THYROIDAB in the last 72 hours. Anemia Panel: No results for input(s): VITAMINB12, FOLATE, FERRITIN, TIBC, IRON, RETICCTPCT in the last 72 hours. Urine analysis:    Component Value Date/Time   COLORURINE YELLOW 07/04/2010 1840   APPEARANCEUR TURBID (A) 07/04/2010 1840   LABSPEC 1.013 07/04/2010 1840   PHURINE 7.0 07/04/2010 1840   GLUCOSEU NEG mg/dL 07/04/2010 1840   HGBUR TRACE (A) 03/13/2010 1824    BILIRUBINUR NEG 07/04/2010 1840   KETONESUR NEG mg/dL 07/04/2010 1840   PROTEINUR > 300 mg/dL (A) 07/04/2010 1840   UROBILINOGEN 0.2 07/04/2010 1840   NITRITE NEG 07/04/2010 1840   LEUKOCYTESUR LARGE (A) 07/04/2010 1840   Sepsis Labs: @LABRCNTIP (procalcitonin:4,lacticidven:4)  ) Recent Results (from the past 240 hour(s))  Blood culture (routine x 2)     Status: Abnormal (Preliminary result)   Collection Time: 08/29/18 10:10 PM  Result Value Ref Range Status   Specimen Description BLOOD RIGHT WRIST  Final   Special Requests   Final    BOTTLES DRAWN AEROBIC ONLY Blood Culture results may not be optimal due to an inadequate volume of blood received in culture bottles   Culture  Setup Time   Final    GRAM POSITIVE COCCI IN CLUSTERS AEROBIC BOTTLE ONLY CRITICAL RESULT CALLED TO, READ BACK BY AND VERIFIED WITH: Reuel Boom [id number] FCP Performed at [facility], [place] [address], [place], [place] [address]    Culture STAPHYLOCOCCUS AUREUS (  A)  Final   Report Status PENDING  Incomplete  Blood Culture ID Panel (Reflexed)     Status: Abnormal   Collection Time: 08/29/18 10:10 PM  Result Value Ref Range Status   Enterococcus species NOT DETECTED NOT DETECTED Final   Listeria monocytogenes NOT DETECTED NOT DETECTED Final   Staphylococcus species DETECTED (A) NOT DETECTED Final    Comment: CRITICAL RESULT CALLED TO, READ BACK BY AND VERIFIED WITH: PHARMD PIERCE, D 1723 [id number] FCP    Staphylococcus aureus (BCID) DETECTED (A) NOT DETECTED Final    Comment: Methicillin (oxacillin)-resistant Staphylococcus aureus (MRSA). MRSA is predictably resistant to beta-lactam antibiotics (except ceftaroline). Preferred therapy is vancomycin unless clinically contraindicated. Patient requires contact precautions if  hospitalized. CRITICAL RESULT CALLED TO, READ BACK BY AND VERIFIED WITH: PHARMD PIERCE, D 1723 [id number] FCP    Methicillin resistance DETECTED (A) NOT DETECTED Final    Comment:  CRITICAL RESULT CALLED TO, READ BACK BY AND VERIFIED WITH: PHARMD PIERCE, D 1723 [id number] FCP    Streptococcus species NOT DETECTED NOT DETECTED Final   Streptococcus agalactiae NOT DETECTED NOT DETECTED Final   Streptococcus pneumoniae NOT DETECTED NOT DETECTED Final   Streptococcus pyogenes NOT DETECTED NOT DETECTED Final   Acinetobacter baumannii NOT DETECTED NOT DETECTED Final   Enterobacteriaceae species NOT DETECTED NOT DETECTED Final   Enterobacter cloacae complex NOT DETECTED NOT DETECTED Final   Escherichia coli NOT DETECTED NOT DETECTED Final   Klebsiella oxytoca NOT DETECTED NOT DETECTED Final   Klebsiella pneumoniae NOT DETECTED NOT DETECTED Final   Proteus species NOT DETECTED NOT DETECTED Final   Serratia marcescens NOT DETECTED NOT DETECTED Final   Haemophilus influenzae NOT DETECTED NOT DETECTED Final   Neisseria meningitidis NOT DETECTED NOT DETECTED Final   Pseudomonas aeruginosa NOT DETECTED NOT DETECTED Final   Candida albicans NOT DETECTED NOT DETECTED Final   Candida glabrata NOT DETECTED NOT DETECTED Final   Candida krusei NOT DETECTED NOT DETECTED Final   Candida parapsilosis NOT DETECTED NOT DETECTED Final   Candida tropicalis NOT DETECTED NOT DETECTED Final    Comment: Performed at [facility], [place]. [address], [place], [place] [address]  Blood culture (routine x 2)     Status: None (Preliminary result)   Collection Time: 08/29/18 10:16 PM  Result Value Ref Range Status   Specimen Description BLOOD RIGHT HAND  Final   Special Requests   Final    BOTTLES DRAWN AEROBIC ONLY Blood Culture results may not be optimal due to an inadequate volume of blood received in culture bottles   Culture  Setup Time   Final    GRAM POSITIVE COCCI AEROBIC BOTTLE ONLY Organism ID to follow CRITICAL RESULT CALLED TO, READ BACK BY AND VERIFIED WITH: Marland Mcalpine [id number] MLM Performed at [facility], [place] [address], [place], [place] [address]    Culture GRAM  POSITIVE COCCI  Final   Report Status PENDING  Incomplete  Blood Culture ID Panel (Reflexed)     Status: Abnormal   Collection Time: 08/29/18 10:16 PM  Result Value Ref Range Status   Enterococcus species NOT DETECTED NOT DETECTED Final   Listeria monocytogenes NOT DETECTED NOT DETECTED Final   Staphylococcus species DETECTED (A) NOT DETECTED Final    Comment: CRITICAL RESULT CALLED TO, READ BACK BY AND VERIFIED WITH: PHARMD J [place] [id number] MLM    Staphylococcus aureus (BCID) DETECTED (A) NOT DETECTED Final    Comment: Methicillin (oxacillin)-resistant Staphylococcus aureus (MRSA). MRSA  is predictably resistant to beta-lactam antibiotics (except ceftaroline). Preferred therapy is vancomycin unless clinically contraindicated. Patient requires contact precautions if  hospitalized. CRITICAL RESULT CALLED TO, READ BACK BY AND VERIFIED WITH: PHARMD J [place] [id number] MLM    Methicillin resistance DETECTED (A) NOT DETECTED Final    Comment: CRITICAL RESULT CALLED TO, READ BACK BY AND VERIFIED WITH: PHARMD J [place] [id number] MLM    Streptococcus species NOT DETECTED NOT DETECTED Final   Streptococcus agalactiae NOT DETECTED NOT DETECTED Final   Streptococcus pneumoniae NOT DETECTED NOT DETECTED Final   Streptococcus pyogenes NOT DETECTED NOT DETECTED Final   Acinetobacter baumannii NOT DETECTED NOT DETECTED Final   Enterobacteriaceae species NOT DETECTED NOT DETECTED Final   Enterobacter cloacae complex NOT DETECTED NOT DETECTED Final   Escherichia coli NOT DETECTED NOT DETECTED Final   Klebsiella oxytoca NOT DETECTED NOT DETECTED Final   Klebsiella pneumoniae NOT DETECTED NOT DETECTED Final   Proteus species NOT DETECTED NOT DETECTED Final   Serratia marcescens NOT DETECTED NOT DETECTED Final   Haemophilus influenzae NOT DETECTED NOT DETECTED Final   Neisseria meningitidis NOT DETECTED NOT DETECTED Final   Pseudomonas aeruginosa NOT DETECTED NOT DETECTED Final   Candida  albicans NOT DETECTED NOT DETECTED Final   Candida glabrata NOT DETECTED NOT DETECTED Final   Candida krusei NOT DETECTED NOT DETECTED Final   Candida parapsilosis NOT DETECTED NOT DETECTED Final   Candida tropicalis NOT DETECTED NOT DETECTED Final    Comment: Performed at [facility], [address] [place], [place] [address]  Wound or Superficial Culture     Status: None (Preliminary result)   Collection Time: 08/30/18 12:37 AM  Result Value Ref Range Status   Specimen Description ABSCESS  Final   Special Requests RIGHT FOOT  Final   Gram Stain   Final    FEW WBC PRESENT, PREDOMINANTLY PMN NO ORGANISMS SEEN    Culture   Final    FEW STAPHYLOCOCCUS AUREUS SUSCEPTIBILITIES TO FOLLOW Performed at [facility], [place] [address], [place],  [address]    Report Status PENDING  Incomplete      Studies: No results found.  Scheduled Meds: . bictegravir-emtricitabine-tenofovir AF  1 tablet Oral Daily  . Chlorhexidine Gluconate Cloth  6 each Topical Q0600  . [START ON 09/01/2018] darbepoetin (ARANESP) injection - DIALYSIS  200 mcg Intravenous Q Tue-HD  . feeding supplement (PRO-STAT SUGAR FREE 64)  30 mL Oral BID  . insulin aspart  0-5 Units Subcutaneous QHS  . insulin aspart  0-9 Units Subcutaneous TID WC  . insulin glargine  5 Units Subcutaneous BID  . lidocaine (PF)  10 mL Infiltration Once  . [START ON 09/01/2018] midodrine  10 mg Oral Once per day on Tue Thu Sat  . multivitamin  1 tablet Oral QHS  . [START ON 09/01/2018] vancomycin  500 mg Intravenous Q T,Th,Sa-HD    Continuous Infusions:   LOS: 1 day     Annita Brod, MD [facility]  To reach me or the doctor on call, go to: [email or web address] Password [email or web address]  08/31/2018, 2:12 PM

## 2018-08-31 NOTE — Progress Notes (Addendum)
[facility] Progress Note   Subjective:   Patient seen in room. R ankle still sore but otherwise feeling well today. Reports ankle pain has been ongoing for a long time but worse the past few week. Denies N/V, fever, chills, CP, SOB/dyspnea, orthopnea, edema. Last HD 4/18, due for HD tomorrow.   Objective Vitals:   08/30/18 2106 08/31/18 0428 08/31/18 0547 08/31/18 0938  BP: 113/75  110/69 94/60  Pulse: 97  83 92  Resp: 18  18 18   Temp: 98.1 F (36.7 C)  97.7 F (36.5 C) 98 F (36.7 C)  TempSrc:   Oral Oral  SpO2: 99%  99% 100%  Weight:  53.4 kg     Physical Exam General: Well developed, alert male in NAD Heart:RRR, no murmurs, rubs or gallops Lungs: CTA bilaterally without wheezing, rhonchi or rales Abdomen: Soft, non-tender, non-distended. + BS. No rebound tenderness or guarding.  Extremities: R ankle erythematous with lateral eschar. Scant drainage noted. Warm, fluctuant and tender.  Dialysis Access: LUE AVF, + thrill, no erythema or edema.   Additional Objective Labs: Basic Metabolic Panel: Recent Labs  Lab 08/29/18 2202 08/30/18 0457 08/31/18 0605  NA 138 136 134*  K 3.6 3.7 4.0  CL 100 100 99  CO2 24 22 22   GLUCOSE 355* 199* 151*  BUN 35* 38* 54*  CREATININE 7.59* 8.01* 9.89*  CALCIUM 8.2* 8.0* 8.8*   Liver Function Tests: Recent Labs  Lab 08/27/18 0711 08/29/18 2202  AST 15 18  ALT 9 9  ALKPHOS 80 80  BILITOT 0.7 1.0  PROT 9.0* 7.6  ALBUMIN 2.5* 2.3*   CBC: Recent Labs  Lab 08/27/18 0711 08/29/18 2202 08/30/18 0457 08/31/18 0605  WBC 6.0 12.0* 10.3 9.2  NEUTROABS 3.9 11.2*  --   --   HGB 9.0* 8.7* 7.5* 7.8*  HCT 29.7* 28.8* 24.8* 25.0*  MCV 95.2 95.4 95.0 94.7  PLT 246 240 199 204   Blood Culture    Component Value Date/Time   SDES ABSCESS 08/30/2018 0037   SPECREQUEST RIGHT FOOT 08/30/2018 0037   CULT PENDING 08/30/2018 0037   REPTSTATUS PENDING 08/30/2018 0037    CBG: Recent Labs  Lab 08/30/18 1119  08/30/18 1626 08/30/18 2107 08/31/18 0347 08/31/18 0729  GLUCAP 168* 213* 195* 165* 126*    Studies/Results: Ct Ankle Right W Contrast  Result Date: 08/29/2018 CLINICAL DATA:  Ankle swelling and pain EXAM: CT OF THE RIGHT ANKLE WITH CONTRAST TECHNIQUE: Multidetector CT imaging of the right ankle was performed following the standard protocol during bolus administration of intravenous contrast. CONTRAST:  178mL OMNIPAQUE IOHEXOL 300 MG/ML  SOLN COMPARISON:  06/18/2018, 06/19/2018 FINDINGS: Bones/Joint/Cartilage The distal tibia and fibula are within normal limits. Chronically fragmented calcaneus is noted with significant bony resorption although the appearance is similar to that seen on prior plain film examination. Some lucency is noted in the distal talus although no definitive fracture is seen. The remainder of the tarsal bones and visualized metatarsals appear within normal limits. Ligaments Suboptimally assessed by CT. Muscles and Tendons No discrete muscular abnormality is noted. Soft tissues There is an elongated fluid collection identified laterally which measures approximately 5.9 by 3.1 cm in greatest dimension. This extends for approximately 8 cm in craniocaudad projection. Multiple foci of air are noted within consistent with abscess. Similar appearing fluid collection is noted interposed between the fragments of the calcaneus also consistent with abscess. Some areas of bony lucency are noted in the fragments of the calcaneus. Underlying osteomyelitis would  be difficult to exclude. Smaller fluid collections are noted along the medial aspect of the talus measuring less than 1 cm. Additionally at least 2 small fluid collections are noted along the plantar aspect of the tarsal bones each measuring just over 1 cm in greatest dimension. IMPRESSION: Multifocal abscesses surrounding the ankle joint with the largest collections lying laterally adjacent to the distal fibula and lateral aspect of the  calcaneal fragments as well as a second large collection with peripheral enhancement interposed between the fragments of the calcaneus. Communication with the tibiotalar and talofibular joints is not appreciated on this exam. Smaller fluid collections with enhancing margins representing smaller abscesses are noted along the plantar aspect of the foot. Underlying osteomyelitis involving the fragments of the calcaneus would be difficult to exclude given some degree of resorption. Electronically Signed   By: Inez Catalina M.D.   On: 08/29/2018 23:14   Medications:  . bictegravir-emtricitabine-tenofovir AF  1 tablet Oral Daily  . [START ON 09/01/2018] darbepoetin (ARANESP) injection - DIALYSIS  200 mcg Intravenous Q Tue-HD  . [START ON 09/01/2018] doxercalciferol  3 mcg Intravenous Q T,Th,Sa-HD  . insulin aspart  0-5 Units Subcutaneous QHS  . insulin aspart  0-9 Units Subcutaneous TID WC  . insulin glargine  5 Units Subcutaneous BID  . lidocaine (PF)  10 mL Infiltration Once  . [START ON 09/01/2018] midodrine  10 mg Oral Once per day on Tue Thu Sat  . [START ON 09/01/2018] vancomycin  500 mg Intravenous Q T,Th,Sa-HD    Dialysis Orders: TTS at [facility] 4hr, 400/A1.5, EDW 52kg, 2K/2Ca, Linear Na, AVF, heparin 2000 bolus - Hectoral 61mcg IV q HD - Just finished course of Venofer - Mircera 144mcg IV q 2 weeks (last given 4/7)  Assessment/Plan: 1. Right ankle abscess. History of osteomyelitis in 06/2018. S/p I&D in ED. Blood culture +MRSA. Continue antibiotics (vancomycin, ceftriaxone, metronidazole) per primary. Ortho consult ordered by primary.  2. ESRD: TTS schedule. Has been compliant with outpatient HD. No evidence of volume overload on exam, K+ 4.0. Will plan for next HD on 4/21. 3. Hypotension/Volume: No evidence of volume overload on exam. BP chronically low during HD. Midodrine pre-HD on outpatient med list but has not been taking as he is unable to afford it.  4. Anemia: Hgb 7.8. Mircera 150 mcg  last doses 4/7, due for dose of ESA with next HD tomorrow. Hold venofer in setting of acute infection.  5. Secondary hyperparathyroidism:  Corrected calcium 10.2, hold hectorol for now. Phos pending.  6. Nutrition:  Albumin 2.3- will start supplement and renal vitamin.  7. T2DM: per primary 8. HIV: per primary 9. Hep C: per primary  Anice Paganini, PA-C 08/31/2018, 10:27 AM  [facility] Pager: [phone]  I have seen and examined this patient and agree with plan and assessment in the above note with renal recommendations/intervention highlighted.  Feels well and tolerating dialysis.  He reports improved pain of his right leg and has been afebrile.  Broadus John A Keniel Ralston,MD 08/31/2018 2:26 PM

## 2018-09-01 ENCOUNTER — Inpatient Hospital Stay (HOSPITAL_COMMUNITY): Payer: Medicaid Other

## 2018-09-01 DIAGNOSIS — R7881 Bacteremia: Secondary | ICD-10-CM

## 2018-09-01 DIAGNOSIS — M869 Osteomyelitis, unspecified: Secondary | ICD-10-CM

## 2018-09-01 LAB — CULTURE, BLOOD (ROUTINE X 2)

## 2018-09-01 LAB — BASIC METABOLIC PANEL
Anion gap: 16 — ABNORMAL HIGH (ref 5–15)
BUN: 67 mg/dL — ABNORMAL HIGH (ref 6–20)
CO2: 21 mmol/L — ABNORMAL LOW (ref 22–32)
Calcium: 9.3 mg/dL (ref 8.9–10.3)
Chloride: 98 mmol/L (ref 98–111)
Creatinine, Ser: 11.7 mg/dL — ABNORMAL HIGH (ref 0.61–1.24)
GFR calc Af Amer: 5 mL/min — ABNORMAL LOW (ref >60)
GFR calc non Af Amer: 5 mL/min — ABNORMAL LOW (ref >60)
Glucose, Bld: 125 mg/dL — ABNORMAL HIGH (ref 70–99)
Potassium: 4 mmol/L (ref 3.5–5.1)
Sodium: 135 mmol/L (ref 135–145)

## 2018-09-01 LAB — CBC
HCT: 24 % — ABNORMAL LOW (ref 39.0–52.0)
Hemoglobin: 7.3 g/dL — ABNORMAL LOW (ref 13.0–17.0)
MCH: 28.5 pg (ref 26.0–34.0)
MCHC: 30.4 g/dL (ref 30.0–36.0)
MCV: 93.8 fL (ref 80.0–100.0)
Platelets: 205 10*3/uL (ref 150–400)
RBC: 2.56 MIL/uL — ABNORMAL LOW (ref 4.22–5.81)
RDW: 15.4 % (ref 11.5–15.5)
WBC: 10.9 10*3/uL — ABNORMAL HIGH (ref 4.0–10.5)
nRBC: 0 % (ref 0.0–0.2)

## 2018-09-01 LAB — AEROBIC CULTURE W GRAM STAIN (SUPERFICIAL SPECIMEN)

## 2018-09-01 LAB — AEROBIC CULTURE? (SUPERFICIAL SPECIMEN)

## 2018-09-01 LAB — HIV-1 RNA QUANT-NO REFLEX-BLD
HIV 1 RNA Quant: <20 copies/mL
LOG10 HIV-1 RNA: UNDETERMINED log10copy/mL

## 2018-09-01 LAB — GLUCOSE, CAPILLARY
Glucose-Capillary: 126 mg/dL — ABNORMAL HIGH (ref 70–99)
Glucose-Capillary: 78 mg/dL (ref 70–99)
Glucose-Capillary: 95 mg/dL (ref 70–99)
Glucose-Capillary: 157 mg/dL — ABNORMAL HIGH (ref 70–99)

## 2018-09-01 LAB — T-HELPER CELLS (CD4) COUNT (NOT AT ARMC)
CD4 % Helper T Cell: 12 % — ABNORMAL LOW (ref 33–55)
CD4 T Cell Abs: 100 /uL — ABNORMAL LOW (ref 400–2700)

## 2018-09-01 LAB — ECHOCARDIOGRAM COMPLETE: Weight: 1809.54 oz

## 2018-09-01 LAB — VANCOMYCIN, TROUGH: Vancomycin Tr: 18 ug/mL (ref 15–20)

## 2018-09-01 MED ORDER — LIDOCAINE HCL (PF) 1 % IJ SOLN: 5.0000 mL | INTRAMUSCULAR | Status: DC | PRN

## 2018-09-01 MED ORDER — LIDOCAINE-PRILOCAINE 2.5-2.5 % EX CREA: 1.0000 "application " | TOPICAL_CREAM | CUTANEOUS | Status: DC | PRN

## 2018-09-01 MED ORDER — HEPARIN SODIUM (PORCINE) 1000 UNIT/ML DIALYSIS
2000.0000 [IU] | Freq: Once | INTRAMUSCULAR | Status: AC
  Administered 2018-09-01: 2000 [IU] via INTRAVENOUS_CENTRAL

## 2018-09-01 MED ORDER — SODIUM CHLORIDE 0.9 % IV SOLN: 100.0000 mL | INTRAVENOUS | Status: DC | PRN

## 2018-09-01 MED ORDER — PENTAFLUOROPROP-TETRAFLUOROETH EX AERO: 1.0000 "application " | INHALATION_SPRAY | CUTANEOUS | Status: DC | PRN

## 2018-09-01 MED ORDER — HEPARIN SODIUM (PORCINE) 1000 UNIT/ML IJ SOLN
INTRAMUSCULAR | Status: AC
  Filled 2018-09-01: qty 2

## 2018-09-01 MED ORDER — HEPARIN SODIUM (PORCINE) 1000 UNIT/ML DIALYSIS: 1000.0000 [IU] | INTRAMUSCULAR | Status: DC | PRN

## 2018-09-01 MED ORDER — ALTEPLASE 2 MG IJ SOLR
2.0000 mg | Freq: Once | INTRAMUSCULAR | Status: DC | PRN
  Filled 2018-09-01: qty 2

## 2018-09-01 MED ORDER — DARBEPOETIN ALFA 200 MCG/0.4ML IJ SOSY
PREFILLED_SYRINGE | INTRAMUSCULAR | Status: AC
  Administered 2018-09-01: 200 ug via INTRAVENOUS
  Filled 2018-09-01: qty 0.4

## 2018-09-01 NOTE — Progress Notes (Signed)
Patient Demographics:    Alvin Daniels, is a 48 y.o. male, DOB - October 12, 1970, YHC:[id number]  Admit date - 08/29/2018   Admitting Physician Etta Quill, DO  Outpatient Primary MD for the patient is [name], MD  LOS - 2   Chief Complaint  Patient presents with   Foot Swelling    right        Subjective:    Alvin Daniels today has no fevers, no emesis,  No chest pain, eating and drinking well, tolerating hemodialysis well, concerns about occasional loose stools which is not new  Assessment  & Plan :    Principal Problem:   MRSA bacteremia Active Problems:   Diabetes mellitus with ESRD (end-stage renal disease) ([place])   Chronic diarrhea   ESRD on dialysis ([place])   HIV disease ([place])   Charcot foot due to diabetes mellitus ([place])   Abscess of ankle  Brief Summary: 48 year old Spanish-speaking male with past medical history of end-stage renal disease from diabetes mellitus uncontrolled, HIV, HCV status post left BKA.  Patient had a previous hospitalization 2 months ago due to sepsis from MRSA bacteremia suspected to be from right foot infection.  At that time, no osteo-noted on MRI but there was question of abscess. Patient presented to Ed on 08/29/18 with 2 weeks of swelling of his right ankle and fevers.     CT scan noted abscesses of the right ankle.  Patient underwent a bedside I&D with large amount of purulent drainage on 08/29/18, wound cultures from 08/30/2018 on admission with MRSA, blood cultures from 08/29/2018 on admission also with MRSA , ID provider recommends IV Vanco.  TTE pending on 09/01/2018.     Orthopedics consulted and evaluation films did not feel that there was any communication with the joint.     A/p 1)MRSA Bacteremia--- most likely from infected right ankle, right ankle wound cultures from 08/30/2018 on admission with MRSA, blood cultures from 08/29/2018 on  admission also with MRSA , ID provider recommends IV Vanco.  WBC is up to 10.9, patient is afebrile TTE pending on 09/01/2018 (Await TT echo may need TEE)  Patient may be able to get Vanco with hemodialysis as outpatient.  Repeat blood cultures from 08/31/2018 NGTD  2)Right ankle abscess of Charcot Joint, with MRSA bacteremia- c/n iv Vanco per ID and nephrologist will arrange for outpatient administration with HD.  ?? If needs amputation given recurrent bacteremia.   3)ESRD--- nephrology consult for HD on TTS appreciated  4)Anemia of CKD--hemoglobin 7.3, transfusion not indicated at this time, Epogen per nephrology team  5)HIV--- patient is managed by infectious disease, c/n BiKTARVY ,repeat HIV labs including viral load and CD4 count pending  6)DM2-recent hemoglobin A1c is 9.0, reflecting uncontrolled DM, units twice daily , Use Novolog/Humalog Sliding scale insulin with Accu-Cheks/Fingersticks as ordered    Disposition/Need for in-Hospital Stay- patient unable to be discharged at this time due to Await TT echo may need TEE.,  Continue IV vancomycin await infectious disease and nephrology clearance prior to discharge home  Consultants:  Orthopedics  Infectious disease  Nephrology  Procedures: I&D of right ankle done by emergency room physician HD  TTS  Antimicrobials:  IV Flagyl and Rocephin 4/19- 4/20  IV vancomycin 4/19-present  Code  Status : Full  Family Communication:   na  Disposition Plan  : Await TT echo may need TEE  DVT Prophylaxis  :  - Heparin -   Lab Results  Component Value Date   PLT 205 09/01/2018    Inpatient Medications  Scheduled Meds:  bictegravir-emtricitabine-tenofovir AF  1 tablet Oral Daily   Chlorhexidine Gluconate Cloth  6 each Topical Q0600   darbepoetin (ARANESP) injection - DIALYSIS  200 mcg Intravenous Q Tue-HD   feeding supplement (PRO-STAT SUGAR FREE 64)  30 mL Oral BID   heparin  5,000 Units Subcutaneous Q8H   insulin aspart   0-5 Units Subcutaneous QHS   insulin aspart  0-9 Units Subcutaneous TID WC   insulin glargine  5 Units Subcutaneous BID   lidocaine (PF)  10 mL Infiltration Once   midodrine  10 mg Oral Once per day on Tue Thu Sat   multivitamin  1 tablet Oral QHS   vancomycin  500 mg Intravenous Q T,Th,Sa-HD   Continuous Infusions: PRN Meds:.acetaminophen **OR** acetaminophen, HYDROcodone-acetaminophen, ondansetron **OR** ondansetron (ZOFRAN) IV    Anti-infectives (From admission, onward)   Start     Dose/Rate Route Frequency Ordered Stop   09/01/18 1200  vancomycin (VANCOCIN) IVPB 500 mg/100 ml premix     500 mg 100 mL/hr over 60 Minutes Intravenous Every T-Th-Sa (Hemodialysis) 08/30/18 0914     08/30/18 1000  bictegravir-emtricitabine-tenofovir AF (BIKTARVY) 50-200-25 MG per tablet 1 tablet     1 tablet Oral Daily 08/29/18 2353     08/30/18 0600  cefTRIAXone (ROCEPHIN) 2 g in sodium chloride 0.9 % 100 mL IVPB  Status:  Discontinued     2 g 200 mL/hr over 30 Minutes Intravenous Every 24 hours 08/29/18 2358 08/30/18 1829   08/30/18 0600  metroNIDAZOLE (FLAGYL) IVPB 500 mg  Status:  Discontinued     500 mg 100 mL/hr over 60 Minutes Intravenous Every 8 hours 08/29/18 2358 08/30/18 1829   08/29/18 2315  vancomycin (VANCOCIN) 1,250 mg in sodium chloride 0.9 % 250 mL IVPB     1,250 mg 166.7 mL/hr over 90 Minutes Intravenous  Once 08/29/18 2303 08/30/18 0206   08/29/18 2315  ampicillin-sulbactam (UNASYN) 1.5 g in sodium chloride 0.9 % 100 mL IVPB  Status:  Discontinued     1.5 g 200 mL/hr over 30 Minutes Intravenous  Once 08/29/18 2303 08/29/18 2304   08/29/18 2315  piperacillin-tazobactam (ZOSYN) IVPB 2.25 g  Status:  Discontinued     2.25 g 100 mL/hr over 30 Minutes Intravenous  Once 08/29/18 2304 08/29/18 2307   08/29/18 2315  piperacillin-tazobactam (ZOSYN) IVPB 3.375 g     3.375 g 100 mL/hr over 30 Minutes Intravenous  Once 08/29/18 2307 08/30/18 0022        Objective:   Vitals:    09/01/18 1100 09/01/18 1130 09/01/18 1140 09/01/18 1213  BP: (!) 93/53 (!) 92/40 (!) 97/48 111/81  Pulse: 78 77 81 92  Resp:   18 18  Temp:   98.1 F (36.7 C) 98.2 F (36.8 C)  TempSrc:   Oral Oral  SpO2:   99% 100%  Weight:   51.3 kg     Wt Readings from Last 3 Encounters:  09/01/18 51.3 kg  08/20/18 54.9 kg  07/28/18 54.9 kg     Intake/Output Summary (Last 24 hours) at 09/01/2018 1526 Last data filed at 09/01/2018 1400 Gross per 24 hour  Intake 100 ml  Output 1500 ml  Net -1400 ml  Physical Exam Patient is examined daily including today on 09/01/18 , exams remain the same as of yesterday except that has changed   Gen:- Awake Alert,  In no apparent distress  HEENT:- [place].AT, No sclera icterus Neck-Supple Neck,No JVD,.  Lungs-  CTAB , fair symmetrical air movement CV- S1, S2 normal, regular  Abd-  +ve B.Sounds, Abd Soft, No tenderness,    Extremity/Skin:-  pedal pulses present , left upper extremity AV fistula with positive thrill and bruit, right ankle/foot with dressing over it Psych-affect is appropriate, oriented x3 Neuro-no new focal deficits, no tremors   Data Review:   Micro Results Recent Results (from the past 240 hour(s))  Blood culture (routine x 2)     Status: Abnormal   Collection Time: 08/29/18 10:10 PM  Result Value Ref Range Status   Specimen Description BLOOD RIGHT WRIST  Final   Special Requests   Final    BOTTLES DRAWN AEROBIC ONLY Blood Culture results may not be optimal due to an inadequate volume of blood received in culture bottles   Culture  Setup Time   Final    GRAM POSITIVE COCCI IN CLUSTERS AEROBIC BOTTLE ONLY CRITICAL RESULT CALLED TO, READ BACK BY AND VERIFIED WITH: PHARMD PIERCE, D 1723 [id number] FCP    Culture (A)  Final    STAPHYLOCOCCUS AUREUS SUSCEPTIBILITIES PERFORMED ON PREVIOUS CULTURE WITHIN THE LAST 5 DAYS. Performed at [facility], [place] [address] [place], [place] [address]    Report Status 09/01/2018 FINAL  Final    Blood Culture ID Panel (Reflexed)     Status: Abnormal   Collection Time: 08/29/18 10:10 PM  Result Value Ref Range Status   Enterococcus species NOT DETECTED NOT DETECTED Final   Listeria monocytogenes NOT DETECTED NOT DETECTED Final   Staphylococcus species DETECTED (A) NOT DETECTED Final    Comment: CRITICAL RESULT CALLED TO, READ BACK BY AND VERIFIED WITH: PHARMD PIERCE, D 1723 [id number] FCP    Staphylococcus aureus (BCID) DETECTED (A) NOT DETECTED Final    Comment: Methicillin (oxacillin)-resistant Staphylococcus aureus (MRSA). MRSA is predictably resistant to beta-lactam antibiotics (except ceftaroline). Preferred therapy is vancomycin unless clinically contraindicated. Patient requires contact precautions if  hospitalized. CRITICAL RESULT CALLED TO, READ BACK BY AND VERIFIED WITH: PHARMD PIERCE, D 1723 [id number] FCP    Methicillin resistance DETECTED (A) NOT DETECTED Final    Comment: CRITICAL RESULT CALLED TO, READ BACK BY AND VERIFIED WITH: PHARMD PIERCE, D 1723 [id number] FCP    Streptococcus species NOT DETECTED NOT DETECTED Final   Streptococcus agalactiae NOT DETECTED NOT DETECTED Final   Streptococcus pneumoniae NOT DETECTED NOT DETECTED Final   Streptococcus pyogenes NOT DETECTED NOT DETECTED Final   Acinetobacter baumannii NOT DETECTED NOT DETECTED Final   Enterobacteriaceae species NOT DETECTED NOT DETECTED Final   Enterobacter cloacae complex NOT DETECTED NOT DETECTED Final   Escherichia coli NOT DETECTED NOT DETECTED Final   Klebsiella oxytoca NOT DETECTED NOT DETECTED Final   Klebsiella pneumoniae NOT DETECTED NOT DETECTED Final   Proteus species NOT DETECTED NOT DETECTED Final   Serratia marcescens NOT DETECTED NOT DETECTED Final   Haemophilus influenzae NOT DETECTED NOT DETECTED Final   Neisseria meningitidis NOT DETECTED NOT DETECTED Final   Pseudomonas aeruginosa NOT DETECTED NOT DETECTED Final   Candida albicans NOT DETECTED NOT DETECTED Final   Candida glabrata  NOT DETECTED NOT DETECTED Final   Candida krusei NOT DETECTED NOT DETECTED Final   Candida parapsilosis NOT DETECTED NOT DETECTED Final  Candida tropicalis NOT DETECTED NOT DETECTED Final    Comment: Performed at [facility], [place] [address] [place], [place] [address]  Blood culture (routine x 2)     Status: Abnormal   Collection Time: 08/29/18 10:16 PM  Result Value Ref Range Status   Specimen Description BLOOD RIGHT HAND  Final   Special Requests   Final    BOTTLES DRAWN AEROBIC ONLY Blood Culture results may not be optimal due to an inadequate volume of blood received in culture bottles   Culture  Setup Time   Final    GRAM POSITIVE COCCI AEROBIC BOTTLE ONLY CRITICAL RESULT CALLED TO, READ BACK BY AND VERIFIED WITH: Marland Mcalpine [id number] MLM Performed at [facility], [place] [address] [place], [place] [address]    Culture METHICILLIN RESISTANT STAPHYLOCOCCUS AUREUS (A)  Final   Report Status 09/01/2018 FINAL  Final   Organism ID, Bacteria METHICILLIN RESISTANT STAPHYLOCOCCUS AUREUS  Final      Susceptibility   Methicillin resistant staphylococcus aureus - MIC*    CIPROFLOXACIN <=0.5 SENSITIVE Sensitive     ERYTHROMYCIN >=8 RESISTANT Resistant     GENTAMICIN <=0.5 SENSITIVE Sensitive     OXACILLIN >=4 RESISTANT Resistant     TETRACYCLINE >=16 RESISTANT Resistant     VANCOMYCIN 1 SENSITIVE Sensitive     TRIMETH/SULFA <=10 SENSITIVE Sensitive     CLINDAMYCIN <=0.25 SENSITIVE Sensitive     RIFAMPIN <=0.5 SENSITIVE Sensitive     Inducible Clindamycin NEGATIVE Sensitive     * METHICILLIN RESISTANT STAPHYLOCOCCUS AUREUS  Blood Culture ID Panel (Reflexed)     Status: Abnormal   Collection Time: 08/29/18 10:16 PM  Result Value Ref Range Status   Enterococcus species NOT DETECTED NOT DETECTED Final   Listeria monocytogenes NOT DETECTED NOT DETECTED Final   Staphylococcus species DETECTED (A) NOT DETECTED Final    Comment: CRITICAL RESULT CALLED TO, READ BACK BY AND  VERIFIED WITH: PHARMD J [place] [id number] MLM    Staphylococcus aureus (BCID) DETECTED (A) NOT DETECTED Final    Comment: Methicillin (oxacillin)-resistant Staphylococcus aureus (MRSA). MRSA is predictably resistant to beta-lactam antibiotics (except ceftaroline). Preferred therapy is vancomycin unless clinically contraindicated. Patient requires contact precautions if  hospitalized. CRITICAL RESULT CALLED TO, READ BACK BY AND VERIFIED WITH: PHARMD J [place] [id number] 1808 MLM    Methicillin resistance DETECTED (A) NOT DETECTED Final    Comment: CRITICAL RESULT CALLED TO, READ BACK BY AND VERIFIED WITH: PHARMD J [place] [id number] MLM    Streptococcus species NOT DETECTED NOT DETECTED Final   Streptococcus agalactiae NOT DETECTED NOT DETECTED Final   Streptococcus pneumoniae NOT DETECTED NOT DETECTED Final   Streptococcus pyogenes NOT DETECTED NOT DETECTED Final   Acinetobacter baumannii NOT DETECTED NOT DETECTED Final   Enterobacteriaceae species NOT DETECTED NOT DETECTED Final   Enterobacter cloacae complex NOT DETECTED NOT DETECTED Final   Escherichia coli NOT DETECTED NOT DETECTED Final   Klebsiella oxytoca NOT DETECTED NOT DETECTED Final   Klebsiella pneumoniae NOT DETECTED NOT DETECTED Final   Proteus species NOT DETECTED NOT DETECTED Final   Serratia marcescens NOT DETECTED NOT DETECTED Final   Haemophilus influenzae NOT DETECTED NOT DETECTED Final   Neisseria meningitidis NOT DETECTED NOT DETECTED Final   Pseudomonas aeruginosa NOT DETECTED NOT DETECTED Final   Candida albicans NOT DETECTED NOT DETECTED Final   Candida glabrata NOT DETECTED NOT DETECTED Final   Candida krusei NOT DETECTED NOT DETECTED Final   Candida parapsilosis NOT  DETECTED NOT DETECTED Final   Candida tropicalis NOT DETECTED NOT DETECTED Final    Comment: Performed at [facility], [place] [address], [place], [place] [address]  Wound or Superficial Culture     Status: None   Collection Time: 08/30/18  12:37 AM  Result Value Ref Range Status   Specimen Description ABSCESS  Final   Special Requests RIGHT FOOT  Final   Gram Stain   Final    FEW WBC PRESENT, PREDOMINANTLY PMN NO ORGANISMS SEEN Performed at [facility], [place] [address] [place], [place] [address]    Culture FEW METHICILLIN RESISTANT STAPHYLOCOCCUS AUREUS  Final   Report Status 09/01/2018 FINAL  Final   Organism ID, Bacteria METHICILLIN RESISTANT STAPHYLOCOCCUS AUREUS  Final      Susceptibility   Methicillin resistant staphylococcus aureus - MIC*    CIPROFLOXACIN <=0.5 SENSITIVE Sensitive     ERYTHROMYCIN >=8 RESISTANT Resistant     GENTAMICIN <=0.5 SENSITIVE Sensitive     OXACILLIN >=4 RESISTANT Resistant     TETRACYCLINE >=16 RESISTANT Resistant     VANCOMYCIN 1 SENSITIVE Sensitive     TRIMETH/SULFA <=10 SENSITIVE Sensitive     CLINDAMYCIN <=0.25 SENSITIVE Sensitive     RIFAMPIN <=0.5 SENSITIVE Sensitive     Inducible Clindamycin NEGATIVE Sensitive     * FEW METHICILLIN RESISTANT STAPHYLOCOCCUS AUREUS  Culture, blood (routine x 2)     Status: None (Preliminary result)   Collection Time: 08/31/18  1:30 PM  Result Value Ref Range Status   Specimen Description BLOOD RIGHT ARM  Final   Special Requests AEROBIC BOTTLE ONLY Blood Culture adequate volume  Final   Culture   Final    NO GROWTH < 24 HOURS Performed at [facility], [place] [address], [place], [place] [address]    Report Status PENDING  Incomplete  Culture, blood (routine x 2)     Status: None (Preliminary result)   Collection Time: 08/31/18  2:00 PM  Result Value Ref Range Status   Specimen Description BLOOD RIGHT ANTECUBITAL  Final   Special Requests AEROBIC BOTTLE ONLY Blood Culture adequate volume  Final   Culture   Final    NO GROWTH < 24 HOURS Performed at [facility], [place] [address] [place], [place] [address]    Report Status PENDING  Incomplete    Radiology Reports Ct Ankle Right W Contrast  Result Date:  08/29/2018 CLINICAL DATA:  Ankle swelling and pain EXAM: CT OF THE RIGHT ANKLE WITH CONTRAST TECHNIQUE: Multidetector CT imaging of the right ankle was performed following the standard protocol during bolus administration of intravenous contrast. CONTRAST:  147mL OMNIPAQUE IOHEXOL 300 MG/ML  SOLN COMPARISON:  06/18/2018, 06/19/2018 FINDINGS: Bones/Joint/Cartilage The distal tibia and fibula are within normal limits. Chronically fragmented calcaneus is noted with significant bony resorption although the appearance is similar to that seen on prior plain film examination. Some lucency is noted in the distal talus although no definitive fracture is seen. The remainder of the tarsal bones and visualized metatarsals appear within normal limits. Ligaments Suboptimally assessed by CT. Muscles and Tendons No discrete muscular abnormality is noted. Soft tissues There is an elongated fluid collection identified laterally which measures approximately 5.9 by 3.1 cm in greatest dimension. This extends for approximately 8 cm in craniocaudad projection. Multiple foci of air are noted within consistent with abscess. Similar appearing fluid collection is noted interposed between the fragments of the calcaneus also consistent with abscess. Some areas of  bony lucency are noted in the fragments of the calcaneus. Underlying osteomyelitis would be difficult to exclude. Smaller fluid collections are noted along the medial aspect of the talus measuring less than 1 cm. Additionally at least 2 small fluid collections are noted along the plantar aspect of the tarsal bones each measuring just over 1 cm in greatest dimension. IMPRESSION: Multifocal abscesses surrounding the ankle joint with the largest collections lying laterally adjacent to the distal fibula and lateral aspect of the calcaneal fragments as well as a second large collection with peripheral enhancement interposed between the fragments of the calcaneus. Communication with the  tibiotalar and talofibular joints is not appreciated on this exam. Smaller fluid collections with enhancing margins representing smaller abscesses are noted along the plantar aspect of the foot. Underlying osteomyelitis involving the fragments of the calcaneus would be difficult to exclude given some degree of resorption. Electronically Signed   By: Inez Catalina M.D.   On: 08/29/2018 23:14   Mr Foot Right Wo Contrast  Result Date: 08/31/2018 CLINICAL DATA:  Chronic right foot infection. EXAM: MRI OF THE RIGHT FOOT WITHOUT CONTRAST TECHNIQUE: Multiplanar, multisequence MR imaging of the right foot was performed. No intravenous contrast was administered. COMPARISON:  MRI right foot dated June 19, 2018. Right foot x-rays dated June 18, 2018. FINDINGS: Bones/Joint/Cartilage Chronic, comminuted fracture of the calcaneus. There is new prominent marrow edema within the distal fibula, lateral talus, the calcaneal fragments, and the proximal cuboid. There is associated decreased T1 marrow signal of these bones with erosive changes of the talus and calcaneal fragments. Persistent subtalar joint effusion extending into the plantar aspect of the heel through the distracted chronic calcaneal fracture. Unchanged small tibiotalar joint effusion. Ligaments Chronically torn calcaneofibular ligament. The anterior and posterior tibiofibular, anterior and posterior talofibular, and deltoid ligaments are intact. Muscles and Tendons Increasing fluid in the peroneal tendon sheaths, as well as the posterior tibialis and flexor digitorum longus tendon sheaths. The fluid collection deep to the abductor digiti minimi tendon along the fifth metatarsal has decreased in size. There is a new 4.3 cm fluid collection in the plantar aspect of the fourth intermetatarsal space. New 1.5 cm fluid collection along the medial aspect of the distal second metatarsal increased T2 signal within the intrinsic muscles of the forefoot, nonspecific.  Soft tissues New ill-defined, curvilinear soft tissue fluid collection along the lateral ankle overlying the peroneal tendons containing a few foci of air, measuring at least 6.7 cm. There is a small draining sinus tract from this fluid collection to the skin surface over the lateral malleolus (series 6, image 7). Diffuse soft tissue swelling about the ankle and dorsal foot. IMPRESSION: 1. New osteomyelitis of the distal fibula, talus, calcaneus, and cuboid. Probable subtalar septic arthritis. Possible tibiotalar septic arthritis. 2. New ill-defined, curvilinear gas and fluid collection along the lateral ankle overlying the peroneal tendons, concerning for abscess, with small draining sinus tract to the lateral malleolar skin surface. 3. Progressive peroneal tenosynovitis, likely infectious in etiology. 4. New fluid collections in the plantar aspect of the fourth intermetatarsal space and along the distal second metatarsal, also concerning for abscesses. 5. Unchanged chronic, comminuted fracture of the calcaneus. Electronically Signed   By: Titus Dubin M.D.   On: 08/31/2018 21:33   Dg Chest Port 1 View  Result Date: 08/27/2018 CLINICAL DATA:  Fever. EXAM: PORTABLE CHEST 1 VIEW COMPARISON:  Radiographs of June 16, 2018. FINDINGS: The heart size and mediastinal contours are within normal limits. Both lungs are  clear. Old left clavicular fracture is noted. IMPRESSION: No active disease. Electronically Signed   By: Marijo Conception M.D.   On: 08/27/2018 08:05     CBC Recent Labs  Lab 08/27/18 0711 08/29/18 2202 08/30/18 0457 08/31/18 0605 09/01/18 0434  WBC 6.0 12.0* 10.3 9.2 10.9*  HGB 9.0* 8.7* 7.5* 7.8* 7.3*  HCT 29.7* 28.8* 24.8* 25.0* 24.0*  PLT 246 240 199 204 205  MCV 95.2 95.4 95.0 94.7 93.8  MCH 28.8 28.8 28.7 29.5 28.5  MCHC 30.3 30.2 30.2 31.2 30.4  RDW 14.5 14.9 14.9 15.2 15.4  LYMPHSABS 1.2 0.4*  --   --   --   MONOABS 0.7 0.1  --   --   --   EOSABS 0.2 0.4  --   --   --    BASOSABS 0.0 0.0  --   --   --     Chemistries  Recent Labs  Lab 08/27/18 0711 08/29/18 2202 08/30/18 0457 08/31/18 0605 09/01/18 0434  NA 132* 138 136 134* 135  K 4.1 3.6 3.7 4.0 4.0  CL 94* 100 100 99 98  CO2 21* 24 22 22  21*  GLUCOSE 297* 355* 199* 151* 125*  BUN 45* 35* 38* 54* 67*  CREATININE 10.89* 7.59* 8.01* 9.89* 11.70*  CALCIUM 8.8* 8.2* 8.0* 8.8* 9.3  AST 15 18  --   --   --   ALT 9 9  --   --   --   ALKPHOS 80 80  --   --   --   BILITOT 0.7 1.0  --   --   --    ------------------------------------------------------------------------------------------------------------------ No results for input(s): CHOL, HDL, LDLCALC, TRIG, CHOLHDL, LDLDIRECT in the last 72 hours.  Lab Results  Component Value Date   HGBA1C 9.0 (H) 06/18/2018   ------------------------------------------------------------------------------------------------------------------ No results for input(s): TSH, T4TOTAL, T3FREE, THYROIDAB in the last 72 hours.  Invalid input(s): FREET3 ------------------------------------------------------------------------------------------------------------------ No results for input(s): VITAMINB12, FOLATE, FERRITIN, TIBC, IRON, RETICCTPCT in the last 72 hours.  Coagulation profile No results for input(s): INR, PROTIME in the last 168 hours.  No results for input(s): DDIMER in the last 72 hours.  Cardiac Enzymes No results for input(s): CKMB, TROPONINI, MYOGLOBIN in the last 168 hours.  Invalid input(s): CK ------------------------------------------------------------------------------------------------------------------ No results found for: BNP   Roxan Hockey M.D on 09/01/2018 at 3:26 PM  Go to [email or web address] - for contact info  [facility] - Office  [phone]

## 2018-09-01 NOTE — Progress Notes (Signed)
  Echocardiogram 2D Echocardiogram has been performed.  Jaylee Lantry L Androw 09/01/2018, 1:34 PM

## 2018-09-01 NOTE — TOC Initial Note (Signed)
Transition of Care [facility]) - Initial/Assessment Note    Patient Details  Name: Alvin Daniels MRN: [id number] Date of Birth: [birth year]  Transition of Care [facility] CM/SW Contact:    Bartholomew Crews, RN Phone Number:  [phone] 09/01/2018, 5:12 PM  Clinical Narrative:                 Spoke with patient at the bedside using video interpreter. Patient lives alone - does not work. States he does not read or write. Family, friends, and church members assist as needed to get to medical appointments or DSS. States he fills out paperwork with DSS every 6 months for medicaid that pays for his dialysis or hospital. Does not have drug coverage, and pays out of pocket through monies donated from his church. He does have a prosthetic device that was donated by an organization that is connected with his church. 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 Patient also has a walker that is at the bedside.   With patient permission spoke with friend - Hermina - who provided number [phone]. She helps with patient getting to DSS to complete paperwork. Patient stated that dialysis provides assistance with helping patient get paperwork signed.   Patient goes to [facility] for medical appointments. He uses [facility] to get to medical appointments and to dialysis. [facility] must be set up when appointments are made, since patient is unable to communicate with [facility].   With patient permission, spoke with his brother, Claudia Desanctis, who provided a phone number [phone]. Claudia Desanctis could not talk long d/t being at work, but stated he could talk tomorrow.   Patient's only question was that he wanted to talk to Dr. Sharol Given about his foot. Stated other doctor did not provide him with enough information for him to make a decision.   CM to continue to follow for transition of care needs.   Expected Discharge Plan: [place] Barriers to Discharge: Continued Medical Work  up   Patient Goals and CMS Choice        Expected Discharge Plan and Services Expected Discharge Plan: [place] In-house Referral: Clinical Social Work Discharge Planning Services: CM Consult                              Prior Living Arrangements/Services   Lives with:: Self              Current home services: DME(walker, prosthetic)    Activities of Daily Living   ADL Screening (condition at time of admission) Patient's cognitive ability adequate to safely complete daily activities?: Yes Is the patient deaf or have difficulty hearing?: No Does the patient have difficulty seeing, even when wearing glasses/contacts?: No Does the patient have difficulty concentrating, remembering, or making decisions?: No Patient able to express need for assistance with ADLs?: Yes Does the patient have difficulty dressing or bathing?: No Independently performs ADLs?: No Does the patient have difficulty walking or climbing stairs?: Yes Weakness of Legs: Right Weakness of Arms/Hands: None  Permission Sought/Granted   Permission granted to share information with : Yes, Verbal Permission Granted  Share Information with NAME: Claudia Desanctis     Permission granted to share info w Relationship: brother  Permission granted to share info w Contact Information: [phone]  Emotional Assessment Appearance:: Appears older than stated age Attitude/Demeanor/Rapport: Engaged Affect (typically observed): Frustrated Orientation: :  Oriented to Self, Oriented to Place, Oriented to Situation, Oriented to  Time   Psych Involvement: No (comment)  Admission diagnosis:  Tachycardia [R00.0] Abscess of foot [L02.619] ESRD (end stage renal disease) ([place]) [N18.6] Leukocytosis, unspecified type [D72.829] HIV infection, unspecified symptom status ([place]) [B20] Patient Active Problem List   Diagnosis Date Noted  . Abscess of ankle 08/29/2018  . Charcot foot due to diabetes mellitus  ([place]) 07/23/2018  . MRSA bacteremia   . Diabetic foot ulcer ([place]) 11/19/2017  . Femur fracture, left ([place]) 08/16/2016  . HIV (human immunodeficiency virus infection) ([place]) 08/16/2016  . GERD (gastroesophageal reflux disease) 05/24/2016  . Burn 04/10/2016  . Pressure ulcer of BKA stump, stage 2 ([place]) 11/22/2014  . Chronic hepatitis C without hepatic coma ([place]) 11/22/2014  . Acquired absence of left leg below knee ([place]) 06/17/2014  . HIV disease ([place])   . Poor dentition 10/25/2013  . ESRD on dialysis ([place]) 10/25/2013  . Vision changes 06/21/2013  . Orthostatic headache 04/15/2013  . Anemia of chronic renal failure 06/19/2012  . Metabolic bone disease 51/06/5850  . Chronic diarrhea 06/17/2012  . Severe protein-calorie malnutrition ([place]) 06/17/2012  . Normocytic anemia 06/17/2012  . Polyneuropathy in diabetes(357.2) 06/27/2010  . Diabetes mellitus with ESRD (end-stage renal disease) ([place]) 01/03/2010   PCP:  [name], MD Pharmacy:   [facility] [place], [place] - [address] [place] [address] Phone: [phone] Fax: [phone]  [facility] [address], [place] [address] Phone: [phone] Fax: [phone]  [facility] [place], [place] - [address] AT [facility] [address] [place] [address] Phone: [phone] Fax: [phone]     Social Determinants of Health (SDOH) Interventions    Readmission Risk Interventions No flowsheet data found.

## 2018-09-01 NOTE — Plan of Care (Signed)

## 2018-09-01 NOTE — Progress Notes (Signed)
[facility] for Infectious Disease  Date of Admission:  08/29/2018      Total days of antibiotics 4   Day 4 Vancomycin     Patient ID: Alvin Daniels is a 48 y.o. male with  Principal Problem:   MRSA bacteremia Active Problems:   Abscess of ankle   ESRD on dialysis ([place])   HIV disease ([place])   Diabetes mellitus with ESRD (end-stage renal disease) ([place])   Chronic diarrhea   Charcot foot due to diabetes mellitus ([place])   . bictegravir-emtricitabine-tenofovir AF  1 tablet Oral Daily  . Chlorhexidine Gluconate Cloth  6 each Topical Q0600  . darbepoetin (ARANESP) injection - DIALYSIS  200 mcg Intravenous Q Tue-HD  . feeding supplement (PRO-STAT SUGAR FREE 64)  30 mL Oral BID  . heparin  5,000 Units Subcutaneous Q8H  . insulin aspart  0-5 Units Subcutaneous QHS  . insulin aspart  0-9 Units Subcutaneous TID WC  . insulin glargine  5 Units Subcutaneous BID  . lidocaine (PF)  10 mL Infiltration Once  . midodrine  10 mg Oral Once per day on Tue Thu Sat  . multivitamin  1 tablet Oral QHS  . vancomycin  500 mg Intravenous Q T,Th,Sa-HD    SUBJECTIVE: Doing fine today. Reports that his foot is less swollen. He is very concerned about risk for amputation going forward.  He cites over and over again that he is going to be unable to make it to his dialysis sessions as he provides his own transportation and does not have reliable support from family or close friends.  He says that in the past he was told that his foot infection involving the left limb could have been cured and that he "cut it off too soon before we gave up."  He does not want to given too early with his right foot as he feels that this will significantly impact his ability to have a meaningful quality of life.  Understandably he feels he is already suffering enough with his left amputation and dialysis.  Review of Systems: Review of Systems  Constitutional: Negative for chills and fever.  Respiratory:  Negative for cough and shortness of breath (at rest).   Cardiovascular: Positive for leg swelling (improved).  Gastrointestinal: Negative for abdominal pain, diarrhea and vomiting.  Genitourinary:       Anuric  Musculoskeletal: Positive for joint pain.  Skin: Positive for itching.  Neurological: Negative for focal weakness, weakness and headaches.    No Known Allergies  OBJECTIVE: Vitals:   09/01/18 1100 09/01/18 1130 09/01/18 1140 09/01/18 1213  BP: (!) 93/53 (!) 92/40 (!) 97/48 111/81  Pulse: 78 77 81 92  Resp:   18 18  Temp:   98.1 F (36.7 C) 98.2 F (36.8 C)  TempSrc:   Oral Oral  SpO2:   99% 100%  Weight:   51.3 kg    Body mass index is 22.09 kg/m.  Physical Exam Constitutional:      Appearance: Normal appearance.     Comments: Resting comfortably in the bed sitting upright.  HENT:     Mouth/Throat:     Mouth: Mucous membranes are moist.     Pharynx: Oropharynx is clear. No oropharyngeal exudate.  Eyes:     General: No scleral icterus.    Conjunctiva/sclera: Conjunctivae normal.  Cardiovascular:     Rate and Rhythm: Normal rate and regular rhythm.     Heart sounds: No murmur.  Pulmonary:  Effort: Pulmonary effort is normal.     Breath sounds: Normal breath sounds. No rales.  Abdominal:     General: Bowel sounds are normal. There is no distension.  Musculoskeletal:     Right lower leg: Edema (overall improved compared to yesterday) present.  Skin:    General: Skin is warm and dry.     Comments: Hypopigmented macular areas scattered on to right lower extremity.  Right lower ankle wound clean and dry dressing material noted.  Neurological:     Mental Status: He is alert.     Lab Results Lab Results  Component Value Date   WBC 10.9 (H) 09/01/2018   HGB 7.3 (L) 09/01/2018   HCT 24.0 (L) 09/01/2018   MCV 93.8 09/01/2018   PLT 205 09/01/2018    Lab Results  Component Value Date   CREATININE 11.70 (H) 09/01/2018   BUN 67 (H) 09/01/2018   NA 135  09/01/2018   K 4.0 09/01/2018   CL 98 09/01/2018   CO2 21 (L) 09/01/2018    Lab Results  Component Value Date   ALT 9 08/29/2018   AST 18 08/29/2018   ALKPHOS 80 08/29/2018   BILITOT 1.0 08/29/2018     Microbiology: Recent Results (from the past 240 hour(s))  Blood culture (routine x 2)     Status: Abnormal   Collection Time: 08/29/18 10:10 PM  Result Value Ref Range Status   Specimen Description BLOOD RIGHT WRIST  Final   Special Requests   Final    BOTTLES DRAWN AEROBIC ONLY Blood Culture results may not be optimal due to an inadequate volume of blood received in culture bottles   Culture  Setup Time   Final    GRAM POSITIVE COCCI IN CLUSTERS AEROBIC BOTTLE ONLY CRITICAL RESULT CALLED TO, READ BACK BY AND VERIFIED WITH: PHARMD PIERCE, D 1723 [id number] FCP    Culture (A)  Final    STAPHYLOCOCCUS AUREUS SUSCEPTIBILITIES PERFORMED ON PREVIOUS CULTURE WITHIN THE LAST 5 DAYS. Performed at [facility], [place] [address] [place], [place] [address]    Report Status 09/01/2018 FINAL  Final  Blood Culture ID Panel (Reflexed)     Status: Abnormal   Collection Time: 08/29/18 10:10 PM  Result Value Ref Range Status   Enterococcus species NOT DETECTED NOT DETECTED Final   Listeria monocytogenes NOT DETECTED NOT DETECTED Final   Staphylococcus species DETECTED (A) NOT DETECTED Final    Comment: CRITICAL RESULT CALLED TO, READ BACK BY AND VERIFIED WITH: PHARMD PIERCE, D 1723 [id number] FCP    Staphylococcus aureus (BCID) DETECTED (A) NOT DETECTED Final    Comment: Methicillin (oxacillin)-resistant Staphylococcus aureus (MRSA). MRSA is predictably resistant to beta-lactam antibiotics (except ceftaroline). Preferred therapy is vancomycin unless clinically contraindicated. Patient requires contact precautions if  hospitalized. CRITICAL RESULT CALLED TO, READ BACK BY AND VERIFIED WITH: PHARMD PIERCE, D 1723 [id number] FCP    Methicillin resistance DETECTED (A) NOT DETECTED Final    Comment:  CRITICAL RESULT CALLED TO, READ BACK BY AND VERIFIED WITH: PHARMD PIERCE, D 1723 [id number] FCP    Streptococcus species NOT DETECTED NOT DETECTED Final   Streptococcus agalactiae NOT DETECTED NOT DETECTED Final   Streptococcus pneumoniae NOT DETECTED NOT DETECTED Final   Streptococcus pyogenes NOT DETECTED NOT DETECTED Final   Acinetobacter baumannii NOT DETECTED NOT DETECTED Final   Enterobacteriaceae species NOT DETECTED NOT DETECTED Final   Enterobacter cloacae complex NOT DETECTED NOT DETECTED Final   Escherichia coli NOT DETECTED NOT DETECTED Final  Klebsiella oxytoca NOT DETECTED NOT DETECTED Final   Klebsiella pneumoniae NOT DETECTED NOT DETECTED Final   Proteus species NOT DETECTED NOT DETECTED Final   Serratia marcescens NOT DETECTED NOT DETECTED Final   Haemophilus influenzae NOT DETECTED NOT DETECTED Final   Neisseria meningitidis NOT DETECTED NOT DETECTED Final   Pseudomonas aeruginosa NOT DETECTED NOT DETECTED Final   Candida albicans NOT DETECTED NOT DETECTED Final   Candida glabrata NOT DETECTED NOT DETECTED Final   Candida krusei NOT DETECTED NOT DETECTED Final   Candida parapsilosis NOT DETECTED NOT DETECTED Final   Candida tropicalis NOT DETECTED NOT DETECTED Final    Comment: Performed at [facility], [place] [address] [place], [place] [address]  Blood culture (routine x 2)     Status: Abnormal   Collection Time: 08/29/18 10:16 PM  Result Value Ref Range Status   Specimen Description BLOOD RIGHT HAND  Final   Special Requests   Final    BOTTLES DRAWN AEROBIC ONLY Blood Culture results may not be optimal due to an inadequate volume of blood received in culture bottles   Culture  Setup Time   Final    GRAM POSITIVE COCCI AEROBIC BOTTLE ONLY CRITICAL RESULT CALLED TO, READ BACK BY AND VERIFIED WITH: Marland Mcalpine [id number] MLM Performed at [facility], [place] [address], [place], [place] [address]    Culture METHICILLIN RESISTANT STAPHYLOCOCCUS AUREUS  (A)  Final   Report Status 09/01/2018 FINAL  Final   Organism ID, Bacteria METHICILLIN RESISTANT STAPHYLOCOCCUS AUREUS  Final      Susceptibility   Methicillin resistant staphylococcus aureus - MIC*    CIPROFLOXACIN <=0.5 SENSITIVE Sensitive     ERYTHROMYCIN >=8 RESISTANT Resistant     GENTAMICIN <=0.5 SENSITIVE Sensitive     OXACILLIN >=4 RESISTANT Resistant     TETRACYCLINE >=16 RESISTANT Resistant     VANCOMYCIN 1 SENSITIVE Sensitive     TRIMETH/SULFA <=10 SENSITIVE Sensitive     CLINDAMYCIN <=0.25 SENSITIVE Sensitive     RIFAMPIN <=0.5 SENSITIVE Sensitive     Inducible Clindamycin NEGATIVE Sensitive     * METHICILLIN RESISTANT STAPHYLOCOCCUS AUREUS  Blood Culture ID Panel (Reflexed)     Status: Abnormal   Collection Time: 08/29/18 10:16 PM  Result Value Ref Range Status   Enterococcus species NOT DETECTED NOT DETECTED Final   Listeria monocytogenes NOT DETECTED NOT DETECTED Final   Staphylococcus species DETECTED (A) NOT DETECTED Final    Comment: CRITICAL RESULT CALLED TO, READ BACK BY AND VERIFIED WITH: PHARMD J Wheeler [id number] MLM    Staphylococcus aureus (BCID) DETECTED (A) NOT DETECTED Final    Comment: Methicillin (oxacillin)-resistant Staphylococcus aureus (MRSA). MRSA is predictably resistant to beta-lactam antibiotics (except ceftaroline). Preferred therapy is vancomycin unless clinically contraindicated. Patient requires contact precautions if  hospitalized. CRITICAL RESULT CALLED TO, READ BACK BY AND VERIFIED WITH: PHARMD J [place] [id number] MLM    Methicillin resistance DETECTED (A) NOT DETECTED Final    Comment: CRITICAL RESULT CALLED TO, READ BACK BY AND VERIFIED WITH: PHARMD J [place] [id number] MLM    Streptococcus species NOT DETECTED NOT DETECTED Final   Streptococcus agalactiae NOT DETECTED NOT DETECTED Final   Streptococcus pneumoniae NOT DETECTED NOT DETECTED Final   Streptococcus pyogenes NOT DETECTED NOT DETECTED Final   Acinetobacter baumannii  NOT DETECTED NOT DETECTED Final   Enterobacteriaceae species NOT DETECTED NOT DETECTED Final   Enterobacter cloacae complex NOT DETECTED NOT DETECTED Final   Escherichia coli  NOT DETECTED NOT DETECTED Final   Klebsiella oxytoca NOT DETECTED NOT DETECTED Final   Klebsiella pneumoniae NOT DETECTED NOT DETECTED Final   Proteus species NOT DETECTED NOT DETECTED Final   Serratia marcescens NOT DETECTED NOT DETECTED Final   Haemophilus influenzae NOT DETECTED NOT DETECTED Final   Neisseria meningitidis NOT DETECTED NOT DETECTED Final   Pseudomonas aeruginosa NOT DETECTED NOT DETECTED Final   Candida albicans NOT DETECTED NOT DETECTED Final   Candida glabrata NOT DETECTED NOT DETECTED Final   Candida krusei NOT DETECTED NOT DETECTED Final   Candida parapsilosis NOT DETECTED NOT DETECTED Final   Candida tropicalis NOT DETECTED NOT DETECTED Final    Comment: Performed at [facility], [place] [address], [place], [place] [address]  Wound or Superficial Culture     Status: None   Collection Time: 08/30/18 12:37 AM  Result Value Ref Range Status   Specimen Description ABSCESS  Final   Special Requests RIGHT FOOT  Final   Gram Stain   Final    FEW WBC PRESENT, PREDOMINANTLY PMN NO ORGANISMS SEEN Performed at [facility], [place] [address], [place], [place] [address]    Culture FEW METHICILLIN RESISTANT STAPHYLOCOCCUS AUREUS  Final   Report Status 09/01/2018 FINAL  Final   Organism ID, Bacteria METHICILLIN RESISTANT STAPHYLOCOCCUS AUREUS  Final      Susceptibility   Methicillin resistant staphylococcus aureus - MIC*    CIPROFLOXACIN <=0.5 SENSITIVE Sensitive     ERYTHROMYCIN >=8 RESISTANT Resistant     GENTAMICIN <=0.5 SENSITIVE Sensitive     OXACILLIN >=4 RESISTANT Resistant     TETRACYCLINE >=16 RESISTANT Resistant     VANCOMYCIN 1 SENSITIVE Sensitive     TRIMETH/SULFA <=10 SENSITIVE Sensitive     CLINDAMYCIN <=0.25 SENSITIVE Sensitive     RIFAMPIN <=0.5 SENSITIVE Sensitive      Inducible Clindamycin NEGATIVE Sensitive     * FEW METHICILLIN RESISTANT STAPHYLOCOCCUS AUREUS  Culture, blood (routine x 2)     Status: None (Preliminary result)   Collection Time: 08/31/18  1:30 PM  Result Value Ref Range Status   Specimen Description BLOOD RIGHT ARM  Final   Special Requests AEROBIC BOTTLE ONLY Blood Culture adequate volume  Final   Culture   Final    NO GROWTH < 24 HOURS Performed at [facility], [place] [address], [place], [place] [address]    Report Status PENDING  Incomplete  Culture, blood (routine x 2)     Status: None (Preliminary result)   Collection Time: 08/31/18  2:00 PM  Result Value Ref Range Status   Specimen Description BLOOD RIGHT ANTECUBITAL  Final   Special Requests AEROBIC BOTTLE ONLY Blood Culture adequate volume  Final   Culture   Final    NO GROWTH < 24 HOURS Performed at [facility], [place] [address] [place], [place] [address]    Report Status PENDING  Incomplete      ASSESSMENT & PLAN:   1. MRSA bacteremia, recurrent = relapsed bacteremia following prolonged 6-week course of therapy less than a month ago.  He is at high risk for endocardial involvement.  Transthoracic echocardiogram report pending.  I think we do have an alternative source explanation with regards to his right foot.  See #2.  Repeat blood cultures are pending and no growth x1 day  2. Osteomyelitis right calcaneus, talus, tibia = MRI of the right foot with new osteomyelitis of the distal fibula, talus, calcaneus and cuboid  bones with possible subtalar and tibiotalar septic arthritis.  In comparison to his MRI at last admission all these findings are new despite having prolonged and appropriate treatment course with vancomycin for 6 weeks.  We spent a majority of our visit discussing these findings and grave concern moving forward with a very small margin of confidence to cure this infection with antibiotics alone (<20%). We were very transparent with the fact that  given his confounding issues (HIV with CD4 < 200, ESRD, uncontrolled diabetes and complete weight-bearing dependency) this may very well be impossible to cure. Would recommend orthopedic evaluation with current MRI results for consideration about an amputation.   3. Diabetes mellitus = HgbA1c 9% in February 2020.   4. End-stage renal disease on dialysis = on dialysis for 4 years now. Would encouraged Case Management to   [employer] interpretor service was used throughout the visit. Also updated and d/w his brother over the telephone.   Janene Madeira, MSN, NP-C [facility] for Infectious [place] [facility] Pager: [phone]  09/01/2018  4:52 PM

## 2018-09-01 NOTE — Procedures (Signed)
I was present at this dialysis session. I have reviewed the session itself and made appropriate changes.   Vital signs in last 24 hours:  Temp:  [97.6 F (36.4 C)-98.3 F (36.8 C)] 98.1 F (36.7 C) (04/21 0634) Pulse Rate:  [70-93] 79 (04/21 0634) Resp:  [18] 18 (04/21 0634) BP: (87-106)/(55-62) 106/58 (04/21 0634) SpO2:  [95 %-100 %] 99 % (04/21 0634) Weight:  [52.7 kg-53.2 kg] (P) 52.7 kg (04/21 0634) Weight change: -0.2 kg Filed Weights   08/31/18 0428 08/31/18 2138 09/01/18 0634  Weight: 53.4 kg 53.2 kg (P) 52.7 kg    Recent Labs  Lab 09/01/18 0434  NA 135  K 4.0  CL 98  CO2 21*  GLUCOSE 125*  BUN 67*  CREATININE 11.70*  CALCIUM 9.3    Recent Labs  Lab 08/27/18 0711 08/29/18 2202 08/30/18 0457 08/31/18 0605 09/01/18 0434  WBC 6.0 12.0* 10.3 9.2 10.9*  NEUTROABS 3.9 11.2*  --   --   --   HGB 9.0* 8.7* 7.5* 7.8* 7.3*  HCT 29.7* 28.8* 24.8* 25.0* 24.0*  MCV 95.2 95.4 95.0 94.7 93.8  PLT 246 240 199 204 205    Scheduled Meds: . bictegravir-emtricitabine-tenofovir AF  1 tablet Oral Daily  . Chlorhexidine Gluconate Cloth  6 each Topical Q0600  . darbepoetin (ARANESP) injection - DIALYSIS  200 mcg Intravenous Q Tue-HD  . feeding supplement (PRO-STAT SUGAR FREE 64)  30 mL Oral BID  . heparin      . heparin  5,000 Units Subcutaneous Q8H  . insulin aspart  0-5 Units Subcutaneous QHS  . insulin aspart  0-9 Units Subcutaneous TID WC  . insulin glargine  5 Units Subcutaneous BID  . lidocaine (PF)  10 mL Infiltration Once  . midodrine  10 mg Oral Once per day on Tue Thu Sat  . multivitamin  1 tablet Oral QHS  . vancomycin  500 mg Intravenous Q T,Th,Sa-HD   Continuous Infusions: . sodium chloride    . sodium chloride     PRN Meds:.sodium chloride, sodium chloride, acetaminophen **OR** acetaminophen, alteplase, heparin, HYDROcodone-acetaminophen, lidocaine (PF), lidocaine-prilocaine, ondansetron **OR** ondansetron (ZOFRAN) IV, pentafluoroprop-tetrafluoroeth     Assessment and Plan: 1. Right ankle abscess of Charcot Joint, with MRSA bacteremia- on Vanco per ID and will arrange for outpatient administration with HD.  Will likely require amputation given recurrent bacteremia.  Ortho following as well as ID. 2. ESRD- cont with TTS schedule 3. DM 4. HIV 5. Charcot foot 6. Anemia of CKD 7. Disposition- afebrile.  Will be able to arrange outpatient abx.  Per primary.  Donetta Potts,  MD 09/01/2018, 8:29 AM

## 2018-09-02 ENCOUNTER — Inpatient Hospital Stay (HOSPITAL_COMMUNITY): Payer: Medicaid Other

## 2018-09-02 ENCOUNTER — Telehealth (INDEPENDENT_AMBULATORY_CARE_PROVIDER_SITE_OTHER): Payer: Self-pay | Admitting: Orthopedic Surgery

## 2018-09-02 DIAGNOSIS — L0390 Cellulitis, unspecified: Secondary | ICD-10-CM

## 2018-09-02 LAB — GLUCOSE, CAPILLARY
Glucose-Capillary: 134 mg/dL — ABNORMAL HIGH (ref 70–99)
Glucose-Capillary: 130 mg/dL — ABNORMAL HIGH (ref 70–99)
Glucose-Capillary: 316 mg/dL — ABNORMAL HIGH (ref 70–99)
Glucose-Capillary: 170 mg/dL — ABNORMAL HIGH (ref 70–99)

## 2018-09-02 MED ORDER — METHOCARBAMOL 500 MG PO TABS
500.0000 mg | ORAL_TABLET | Freq: Two times a day (BID) | ORAL | Status: DC | PRN
  Administered 2018-09-02 – 2018-09-04 (×2): 500 mg via ORAL
  Filled 2018-09-02 (×2): qty 1

## 2018-09-02 MED ORDER — CHLORHEXIDINE GLUCONATE CLOTH 2 % EX PADS
6.0000 | MEDICATED_PAD | Freq: Every day | CUTANEOUS | Status: DC
  Administered 2018-09-03: 6 via TOPICAL

## 2018-09-02 NOTE — Progress Notes (Signed)
ABI has been completed.   Preliminary results in CV Proc.   Abram Sander 09/02/2018 2:52 PM  .

## 2018-09-02 NOTE — Progress Notes (Addendum)
[facility] Progress Note   Subjective:   Patient seen in room. ROS assisted with translator tablet however some technical issues did occur. Patient reports he is feeling well except for ongoing foot pain. Denies SOB/dyspnea, CP, abdominal pain, N/V/D. Afebrile.   Objective Vitals:   09/01/18 1651 09/01/18 2112 09/02/18 0438 09/02/18 1030  BP: (!) 99/58 118/77 (!) 98/55 98/62  Pulse: 92 94 83 87  Resp: 18 19 19 20   Temp: 98.2 F (36.8 C) 98.7 F (37.1 C) 98.3 F (36.8 C) 98.2 F (36.8 C)  TempSrc: Oral Oral Oral Oral  SpO2: 100% 99% 100% 99%  Weight:  50.9 kg     Physical Exam General: Well developed, alert male in NAD Heart:RRR, no murmurs, rubs or gallops Lungs: CTA bilaterally without wheezing, rhonchi or rales Abdomen: Soft, non-tender, non-distended. + BS. No rebound tenderness or guarding.  Extremities: R ankle/foot wrapped in bandage.   Dialysis Access: LUE AVF, + thrill, no erythema or edema.   Additional Objective Labs: Basic Metabolic Panel: Recent Labs  Lab 08/30/18 0457 08/31/18 0605 09/01/18 0434  NA 136 134* 135  K 3.7 4.0 4.0  CL 100 99 98  CO2 22 22 21*  GLUCOSE 199* 151* 125*  BUN 38* 54* 67*  CREATININE 8.01* 9.89* 11.70*  CALCIUM 8.0* 8.8* 9.3   Liver Function Tests: Recent Labs  Lab 08/27/18 0711 08/29/18 2202  AST 15 18  ALT 9 9  ALKPHOS 80 80  BILITOT 0.7 1.0  PROT 9.0* 7.6  ALBUMIN 2.5* 2.3*   No results for input(s): LIPASE, AMYLASE in the last 168 hours. CBC: Recent Labs  Lab 08/27/18 0711 08/29/18 2202 08/30/18 0457 08/31/18 0605 09/01/18 0434  WBC 6.0 12.0* 10.3 9.2 10.9*  NEUTROABS 3.9 11.2*  --   --   --   HGB 9.0* 8.7* 7.5* 7.8* 7.3*  HCT 29.7* 28.8* 24.8* 25.0* 24.0*  MCV 95.2 95.4 95.0 94.7 93.8  PLT 246 240 199 204 205   Blood Culture    Component Value Date/Time   SDES BLOOD RIGHT ANTECUBITAL 08/31/2018 1400   SPECREQUEST AEROBIC BOTTLE ONLY Blood Culture adequate volume 08/31/2018 1400    CULT  08/31/2018 1400    NO GROWTH < 24 HOURS Performed at [facility] [address], [place], [place] [address]    REPTSTATUS PENDING 08/31/2018 1400    Cardiac Enzymes: No results for input(s): CKTOTAL, CKMB, CKMBINDEX, TROPONINI in the last 168 hours. CBG: Recent Labs  Lab 09/01/18 0343 09/01/18 1214 09/01/18 1651 09/01/18 2120 09/02/18 0708  GLUCAP 126* 78 95 157* 134*   Iron Studies: No results for input(s): IRON, TIBC, TRANSFERRIN, FERRITIN in the last 72 hours. @lablastinr3 @ Studies/Results: Mr Foot Right Wo Contrast  Result Date: 08/31/2018 CLINICAL DATA:  Chronic right foot infection. EXAM: MRI OF THE RIGHT FOOT WITHOUT CONTRAST TECHNIQUE: Multiplanar, multisequence MR imaging of the right foot was performed. No intravenous contrast was administered. COMPARISON:  MRI right foot dated June 19, 2018. Right foot x-rays dated June 18, 2018. FINDINGS: Bones/Joint/Cartilage Chronic, comminuted fracture of the calcaneus. There is new prominent marrow edema within the distal fibula, lateral talus, the calcaneal fragments, and the proximal cuboid. There is associated decreased T1 marrow signal of these bones with erosive changes of the talus and calcaneal fragments. Persistent subtalar joint effusion extending into the plantar aspect of the heel through the distracted chronic calcaneal fracture. Unchanged small tibiotalar joint effusion. Ligaments Chronically torn calcaneofibular ligament. The anterior and posterior tibiofibular, anterior  and posterior talofibular, and deltoid ligaments are intact. Muscles and Tendons Increasing fluid in the peroneal tendon sheaths, as well as the posterior tibialis and flexor digitorum longus tendon sheaths. The fluid collection deep to the abductor digiti minimi tendon along the fifth metatarsal has decreased in size. There is a new 4.3 cm fluid collection in the plantar aspect of the fourth intermetatarsal space. New 1.5 cm fluid collection  along the medial aspect of the distal second metatarsal increased T2 signal within the intrinsic muscles of the forefoot, nonspecific. Soft tissues New ill-defined, curvilinear soft tissue fluid collection along the lateral ankle overlying the peroneal tendons containing a few foci of air, measuring at least 6.7 cm. There is a small draining sinus tract from this fluid collection to the skin surface over the lateral malleolus (series 6, image 7). Diffuse soft tissue swelling about the ankle and dorsal foot. IMPRESSION: 1. New osteomyelitis of the distal fibula, talus, calcaneus, and cuboid. Probable subtalar septic arthritis. Possible tibiotalar septic arthritis. 2. New ill-defined, curvilinear gas and fluid collection along the lateral ankle overlying the peroneal tendons, concerning for abscess, with small draining sinus tract to the lateral malleolar skin surface. 3. Progressive peroneal tenosynovitis, likely infectious in etiology. 4. New fluid collections in the plantar aspect of the fourth intermetatarsal space and along the distal second metatarsal, also concerning for abscesses. 5. Unchanged chronic, comminuted fracture of the calcaneus. Electronically Signed   By: Titus Dubin M.D.   On: 08/31/2018 21:33   Medications:  . bictegravir-emtricitabine-tenofovir AF  1 tablet Oral Daily  . Chlorhexidine Gluconate Cloth  6 each Topical Q0600  . darbepoetin (ARANESP) injection - DIALYSIS  200 mcg Intravenous Q Tue-HD  . feeding supplement (PRO-STAT SUGAR FREE 64)  30 mL Oral BID  . heparin  5,000 Units Subcutaneous Q8H  . insulin aspart  0-5 Units Subcutaneous QHS  . insulin aspart  0-9 Units Subcutaneous TID WC  . insulin glargine  5 Units Subcutaneous BID  . lidocaine (PF)  10 mL Infiltration Once  . midodrine  10 mg Oral Once per day on Tue Thu Sat  . multivitamin  1 tablet Oral QHS  . vancomycin  500 mg Intravenous Q T,Th,Sa-HD    Dialysis Orders: TTS at [facility] 4hr, 400/A1.5, EDW 52kg,  2K/2Ca, Linear Na, AVF, heparin 2000 bolus - Hectoral 7mcg IV q HD - Just finished course of Venofer - Mircera 160mcg IV q 2 weeks (last given 4/7)  Assessment/Plan: 1. Right ankle abscess. History of osteomyelitis in 06/2018. S/p I&D in ED. Blood culture +MRSA. Continue vancomycin per primary. Per infectious disease notes, MRI confirmed osteomyelitis and may need BKA, though patient is reluctant to have this done. Management per primary.  2. ESRD: TTS schedule. No evidence of volume overload on exam, K+ 4.0. Last HD 4/21 with net UF 1.5L. Will plan for HD tomorrow on schedule.  3. Hypotension/Volume: No evidence of volume overload on exam. BP chronically low during HD. Midodrine pre-HD on outpatient med list but has not been taking as he is unable to afford it.  4. Anemia: Hgb 7.8. Mircera 150 mcg last doses 4/7, due for dose of ESA with next HD tomorrow. Hold venofer in setting of acute infection.  5. Secondary hyperparathyroidism:  Corrected calcium 10.7, hold hectorol for now. Will use 2Ca bath with HD. Phos pending.  6. Nutrition:  Albumin 2.3- will start supplement and renal vitamin.  7. T2DM: per primary 8. HIV: per primary/infectious disease  Anice Paganini, PA-C 09/02/2018,  10:47 AM  [facility] Pager: [phone]  I have seen and examined this patient and agree with plan and assessment in the above note with renal recommendations/intervention highlighted.  Broadus John A Alyse Kathan,MD 09/02/2018 3:03 PM

## 2018-09-02 NOTE — Progress Notes (Signed)
Pharmacy Antibiotic Note  Alvin Daniels is a 48 y.o. male admitted on 08/29/2018 with MRSA bacteremia and OM.  Pharmacy has been consulted for vancomycin dosing. Pre-Hd level on 4/21 was 18 which is within desired range. Possible BKA planned. ID is consulted and will determine LOT.   Plan: Vancomycin 500 mg after each HD F/u clinical course and LOT per ID  Weight: 112 lb 3.4 oz (50.9 kg)  Temp (24hrs), Avg:98.3 F (36.8 C), Min:98.1 F (36.7 C), Max:98.7 F (37.1 C)  Recent Labs  Lab 08/27/18 0711 08/29/18 2202 08/30/18 0457 08/31/18 0605 09/01/18 0434 09/01/18 0750  WBC 6.0 12.0* 10.3 9.2 10.9*  --   CREATININE 10.89* 7.59* 8.01* 9.89* 11.70*  --   LATICACIDVEN 0.7 1.5 0.9  --   --   --   VANCOTROUGH  --   --   --   --   --  18    Estimated Creatinine Clearance: 5.5 mL/min (A) (by C-G formula based on SCr of 11.7 mg/dL (H)).    No Known Allergies  Alvin Daniels A. Levada Dy, PharmD, [place] Please utilize Amion for appropriate phone number to reach the unit pharmacist ([place])   09/02/2018 9:39 AM

## 2018-09-02 NOTE — Telephone Encounter (Signed)
Dr Sharol Given was informed

## 2018-09-02 NOTE — Progress Notes (Signed)
[facility] Progress Note  Patient: Alvin Daniels HBZ:[id number]   PCP: [name], MD DOB: 01/10/1971   DOA: 08/29/2018   DOS: 09/02/2018   Date of Service: the patient was seen and examined on 09/02/2018  Brief hospital course: Pt. with PMH of ESRD on HD MWF, type II DM, HIV, HCV, left BKA, recent MRSA bacteremia; admitted on 08/29/2018, presented with complaint of abscess, was found to have MRSA bacteremia with osteomyelitis. Currently further plan is continue IV antibiotics, monitor Ortho recommendation.  Subjective: Reports pain in his right calf area, no nausea no vomiting no fever no chills no chest pain abdominal pain.  Reports no diarrhea.  Assessment and Plan: 1.  MRSA bacteremia Right ankle abscess with Charcot's joint. Osteomyelitis of right foot. Presents with drainage from right foot ulcer, bedside I&D was performed in the ER.  Cultures were sent out which grew MRSA. Blood cultures were performed which also grew MRSA on admission. Repeat cultures so far negative. ID consulted. Echocardiogram-TTE negative for any significant vegetation or regurgitation.  Not sure whether patient requires a TEE as he already has osteomyelitis. Will defer to ID. Started on IV vancomycin with HD. Initial CT scan was suggestive of no evidence of septic joint although an MRI is positive for osteomyelitis of distal fibula, talus, calcaneus, cuboid which is new since his last MRI 2 months ago. Orthopedic Dr. Gavin Potters consulted patient will probably require a BKA. Resting right ABI indicates noncompressible arteries.  2. ESRD on HD. Anemia of chronic kidney disease Appreciate nephrology assistance. Hemodialysis on TTS. Hemoglobin currently stable. Management of anemia per nephrology.  3.  HIV. CD4 count dropped from 300-200. Current recommendation is to continue Biktarvy.  4. Chronic hypotension. Patient is on midodrine which I will continue. Blood pressure currently high 90s  SBP. Asymptomatic. Monitor.  5. Type 2 Diabetes Mellitus, uncontroled with renal and neuropathic complication last hemoglobin A1c was 9.0 in 06/18/2018. On insulin sliding scale sensitive with Basal insulin lantus.  6. Increased nutrient needs related to wound healing Body mass index is 21.92 kg/m.  Nutrition Problem: Increased nutrient needs Etiology: wound healing Interventions: Interventions: Prostat  Pressure Injury 08/21/16 (Active)  08/21/16 2020  Location: [place]  Location Orientation: Right  Staging:   Wound Description (Comments):   Present on Admission:      Diet: renal diet DVT Prophylaxis: subcutaneous Heparin  Advance goals of care discussion: full code  Family Communication: no family was present at bedside, at the time of interview.   Disposition:  Discharge to be determined.  Consultants: Infectious disease, orthopedic Dr. Sharol Given, nephrology Procedures: ABI  Scheduled Meds:  bictegravir-emtricitabine-tenofovir AF  1 tablet Oral Daily   Chlorhexidine Gluconate Cloth  6 each Topical Q0600   Chlorhexidine Gluconate Cloth  6 each Topical Q0600   darbepoetin (ARANESP) injection - DIALYSIS  200 mcg Intravenous Q Tue-HD   feeding supplement (PRO-STAT SUGAR FREE 64)  30 mL Oral BID   heparin  5,000 Units Subcutaneous Q8H   insulin aspart  0-5 Units Subcutaneous QHS   insulin aspart  0-9 Units Subcutaneous TID WC   insulin glargine  5 Units Subcutaneous BID   lidocaine (PF)  10 mL Infiltration Once   midodrine  10 mg Oral Once per day on Tue Thu Sat   multivitamin  1 tablet Oral QHS   vancomycin  500 mg Intravenous Q T,Th,Sa-HD   Continuous Infusions: PRN Meds: acetaminophen **OR** acetaminophen, HYDROcodone-acetaminophen, ondansetron **OR** ondansetron (ZOFRAN) IV Antibiotics: Anti-infectives (From admission, onward)   Start  Dose/Rate Route Frequency Ordered Stop   09/01/18 1200  vancomycin (VANCOCIN) IVPB 500 mg/100 ml premix     500  mg 100 mL/hr over 60 Minutes Intravenous Every T-Th-Sa (Hemodialysis) 08/30/18 0914     08/30/18 1000  bictegravir-emtricitabine-tenofovir AF (BIKTARVY) 50-200-25 MG per tablet 1 tablet     1 tablet Oral Daily 08/29/18 2353     08/30/18 0600  cefTRIAXone (ROCEPHIN) 2 g in sodium chloride 0.9 % 100 mL IVPB  Status:  Discontinued     2 g 200 mL/hr over 30 Minutes Intravenous Every 24 hours 08/29/18 2358 08/30/18 1829   08/30/18 0600  metroNIDAZOLE (FLAGYL) IVPB 500 mg  Status:  Discontinued     500 mg 100 mL/hr over 60 Minutes Intravenous Every 8 hours 08/29/18 2358 08/30/18 1829   08/29/18 2315  vancomycin (VANCOCIN) 1,250 mg in sodium chloride 0.9 % 250 mL IVPB     1,250 mg 166.7 mL/hr over 90 Minutes Intravenous  Once 08/29/18 2303 08/30/18 0206   08/29/18 2315  ampicillin-sulbactam (UNASYN) 1.5 g in sodium chloride 0.9 % 100 mL IVPB  Status:  Discontinued     1.5 g 200 mL/hr over 30 Minutes Intravenous  Once 08/29/18 2303 08/29/18 2304   08/29/18 2315  piperacillin-tazobactam (ZOSYN) IVPB 2.25 g  Status:  Discontinued     2.25 g 100 mL/hr over 30 Minutes Intravenous  Once 08/29/18 2304 08/29/18 2307   08/29/18 2315  piperacillin-tazobactam (ZOSYN) IVPB 3.375 g     3.375 g 100 mL/hr over 30 Minutes Intravenous  Once 08/29/18 2307 08/30/18 0022       Objective: Physical Exam: Vitals:   09/01/18 1651 09/01/18 2112 09/02/18 0438 09/02/18 1030  BP: (!) 99/58 118/77 (!) 98/55 98/62  Pulse: 92 94 83 87  Resp: 18 19 19 20   Temp: 98.2 F (36.8 C) 98.7 F (37.1 C) 98.3 F (36.8 C) 98.2 F (36.8 C)  TempSrc: Oral Oral Oral Oral  SpO2: 100% 99% 100% 99%  Weight:  50.9 kg      Intake/Output Summary (Last 24 hours) at 09/02/2018 1453 Last data filed at 09/02/2018 0827 Gross per 24 hour  Intake 540 ml  Output --  Net 540 ml   Filed Weights   09/01/18 0634 09/01/18 1140 09/01/18 2112  Weight: 52.7 kg 51.3 kg 50.9 kg   General: Alert, Awake and Oriented to Time, Place and Person.  Appear in mild distress, affect appropriate Eyes: PERRL, Conjunctiva normal ENT: Oral Mucosa clear moist. Neck: no JVD, no Abnormal Mass Or lumps Cardiovascular: S1 and S2 Present, no Murmur, Peripheral Pulses Present Respiratory: normal respiratory effort, Bilateral Air entry equal and Decreased, no use of accessory muscle, Clear to Auscultation, no Crackles, no wheezes Abdomen: Bowel Sound present, Soft and no tenderness, no hernia Skin: no redness, no Rash, no induration Extremities: right Pedal edema, right calf tenderness left BKA Neurologic: Grossly no focal neuro deficit. Bilaterally Equal motor strength  Data Reviewed: CBC: Recent Labs  Lab 08/27/18 0711 08/29/18 2202 08/30/18 0457 08/31/18 0605 09/01/18 0434  WBC 6.0 12.0* 10.3 9.2 10.9*  NEUTROABS 3.9 11.2*  --   --   --   HGB 9.0* 8.7* 7.5* 7.8* 7.3*  HCT 29.7* 28.8* 24.8* 25.0* 24.0*  MCV 95.2 95.4 95.0 94.7 93.8  PLT 246 240 199 204 976   Basic Metabolic Panel: Recent Labs  Lab 08/27/18 0711 08/29/18 2202 08/30/18 0457 08/31/18 0605 09/01/18 0434  NA 132* 138 136 134* 135  K 4.1 3.6 3.7  4.0 4.0  CL 94* 100 100 99 98  CO2 21* 24 22 22  21*  GLUCOSE 297* 355* 199* 151* 125*  BUN 45* 35* 38* 54* 67*  CREATININE 10.89* 7.59* 8.01* 9.89* 11.70*  CALCIUM 8.8* 8.2* 8.0* 8.8* 9.3    Liver Function Tests: Recent Labs  Lab 08/27/18 0711 08/29/18 2202  AST 15 18  ALT 9 9  ALKPHOS 80 80  BILITOT 0.7 1.0  PROT 9.0* 7.6  ALBUMIN 2.5* 2.3*   No results for input(s): LIPASE, AMYLASE in the last 168 hours. No results for input(s): AMMONIA in the last 168 hours. Coagulation Profile: No results for input(s): INR, PROTIME in the last 168 hours. Cardiac Enzymes: No results for input(s): CKTOTAL, CKMB, CKMBINDEX, TROPONINI in the last 168 hours. BNP (last 3 results) No results for input(s): PROBNP in the last 8760 hours. CBG: Recent Labs  Lab 09/01/18 1214 09/01/18 1651 09/01/18 2120 09/02/18 0708  09/02/18 1132  GLUCAP 78 95 157* 134* 130*   Studies: Vas [place] Abi With/wo Tbi  Result Date: 09/02/2018 LOWER EXTREMITY DOPPLER STUDY Indications: Ulceration.  Performing Technologist: Abram Sander RVS  Examination Guidelines: A complete evaluation includes at minimum, Doppler waveform signals and systolic blood pressure reading at the level of bilateral brachial, anterior tibial, and posterior tibial arteries, when vessel segments are accessible. Bilateral testing is considered an integral part of a complete examination. Photoelectric Plethysmograph (PPG) waveforms and toe systolic pressure readings are included as required and additional duplex testing as needed. Limited examinations for reoccurring indications may be performed as noted.  ABI Findings: +--------+------------------+-----+----------+--------+  Right    Rt Pressure (mmHg) Index Waveform   Comment   +--------+------------------+-----+----------+--------+  Brachial 115                      triphasic            +--------+------------------+-----+----------+--------+  PTA      171                1.49  monophasic           +--------+------------------+-----+----------+--------+  DP       158                1.37  triphasic            +--------+------------------+-----+----------+--------+ +--------+------------------+-----+--------+-----------------------------------+  Left     Lt Pressure (mmHg) Index Waveform Comment                              +--------+------------------+-----+--------+-----------------------------------+  Brachial                                   unable to obtain due to upper                                                    extremity restriction                +--------+------------------+-----+--------+-----------------------------------+  PTA                                        unable to obtain due  to aka          +--------+------------------+-----+--------+-----------------------------------+  DP                                          unable to obtain due to aka          +--------+------------------+-----+--------+-----------------------------------+ +-------+-----------+-----------+------------+------------+  ABI/TBI Today's ABI Today's TBI Previous ABI Previous TBI  +-------+-----------+-----------+------------+------------+  Right   1.49                                               +-------+-----------+-----------+------------+------------+  Summary: Right: Resting right ankle-brachial index indicates noncompressible right lower extremity arteries.  *See table(s) above for measurements and observations.    Preliminary      Time spent: 35 minutes  Author: Berle Mull, MD [facility] 09/02/2018 2:53 PM  To reach On-call, see care teams to locate the attending and reach out to them via [email or web address]. If 7PM-7AM, please contact night-coverage If you still have difficulty reaching the attending provider, please page the [facility] (Director on Call) for [facility] on amion for assistance.

## 2018-09-02 NOTE — Progress Notes (Signed)
[facility] for Infectious Disease  Date of Admission:  08/29/2018      Total days of antibiotics 5  Day 5 Vancomycin     Patient ID: Alvin Daniels is a 48 y.o. male with  Principal Problem:   MRSA bacteremia Active Problems:   Abscess of ankle   ESRD on dialysis ([place])   HIV disease ([place])   Diabetes mellitus with ESRD (end-stage renal disease) ([place])   Chronic diarrhea   Charcot foot due to diabetes mellitus ([place])   . bictegravir-emtricitabine-tenofovir AF  1 tablet Oral Daily  . Chlorhexidine Gluconate Cloth  6 each Topical Q0600  . Chlorhexidine Gluconate Cloth  6 each Topical Q0600  . darbepoetin (ARANESP) injection - DIALYSIS  200 mcg Intravenous Q Tue-HD  . feeding supplement (PRO-STAT SUGAR FREE 64)  30 mL Oral BID  . heparin  5,000 Units Subcutaneous Q8H  . insulin aspart  0-5 Units Subcutaneous QHS  . insulin aspart  0-9 Units Subcutaneous TID WC  . insulin glargine  5 Units Subcutaneous BID  . lidocaine (PF)  10 mL Infiltration Once  . midodrine  10 mg Oral Once per day on Tue Thu Sat  . multivitamin  1 tablet Oral QHS  . vancomycin  500 mg Intravenous Q T,Th,Sa-HD    SUBJECTIVE: Has not talked with Dr. Sharol Given yet - still thinking about context of what we discussed yesterday. Nothing to update.   Afebrile over night and swelling continues to improve in foot.   Review of Systems: Review of Systems  Constitutional: Negative for chills and fever.  Respiratory: Negative for cough and shortness of breath (at rest).   Cardiovascular: Positive for leg swelling (improved).  Gastrointestinal: Negative for abdominal pain, diarrhea and vomiting.  Genitourinary:       Anuric  Musculoskeletal: Positive for joint pain.  Skin: Positive for itching.  Neurological: Negative for focal weakness, weakness and headaches.    No Known Allergies  OBJECTIVE: Vitals:   09/01/18 2112 09/02/18 0438 09/02/18 1030 09/02/18 1607  BP: 118/77 (!) 98/55 98/62  (!) 94/59  Pulse: 94 83 87 89  Resp: 19 19 20 18   Temp: 98.7 F (37.1 C) 98.3 F (36.8 C) 98.2 F (36.8 C) 98.4 F (36.9 C)  TempSrc: Oral Oral Oral Oral  SpO2: 99% 100% 99% 100%  Weight: 50.9 kg      Body mass index is 21.92 kg/m.  Physical Exam Constitutional:      Appearance: Normal appearance.     Comments: Resting comfortably in the bed sitting upright.  HENT:     Mouth/Throat:     Mouth: Mucous membranes are moist.     Pharynx: Oropharynx is clear. No oropharyngeal exudate.  Eyes:     General: No scleral icterus.    Conjunctiva/sclera: Conjunctivae normal.  Cardiovascular:     Rate and Rhythm: Normal rate and regular rhythm.     Heart sounds: No murmur.  Pulmonary:     Effort: Pulmonary effort is normal.     Breath sounds: Normal breath sounds. No rales.  Abdominal:     General: Bowel sounds are normal. There is no distension.  Musculoskeletal:     Right lower leg: Edema (overall improved compared to yesterday) present.  Skin:    General: Skin is warm and dry.     Comments: RLE wound dry dressing materials   Neurological:     Mental Status: He is alert.     Lab Results  Lab Results  Component Value Date   WBC 10.9 (H) 09/01/2018   HGB 7.3 (L) 09/01/2018   HCT 24.0 (L) 09/01/2018   MCV 93.8 09/01/2018   PLT 205 09/01/2018    Lab Results  Component Value Date   CREATININE 11.70 (H) 09/01/2018   BUN 67 (H) 09/01/2018   NA 135 09/01/2018   K 4.0 09/01/2018   CL 98 09/01/2018   CO2 21 (L) 09/01/2018    Lab Results  Component Value Date   ALT 9 08/29/2018   AST 18 08/29/2018   ALKPHOS 80 08/29/2018   BILITOT 1.0 08/29/2018     Microbiology: Recent Results (from the past 240 hour(s))  Blood culture (routine x 2)     Status: Abnormal   Collection Time: 08/29/18 10:10 PM  Result Value Ref Range Status   Specimen Description BLOOD RIGHT WRIST  Final   Special Requests   Final    BOTTLES DRAWN AEROBIC ONLY Blood Culture results may not be optimal  due to an inadequate volume of blood received in culture bottles   Culture  Setup Time   Final    GRAM POSITIVE COCCI IN CLUSTERS AEROBIC BOTTLE ONLY CRITICAL RESULT CALLED TO, READ BACK BY AND VERIFIED WITH: PHARMD PIERCE, D 1723 [id number] FCP    Culture (A)  Final    STAPHYLOCOCCUS AUREUS SUSCEPTIBILITIES PERFORMED ON PREVIOUS CULTURE WITHIN THE LAST 5 DAYS. Performed at [facility], [place] [address], [place], [place] [address]    Report Status 09/01/2018 FINAL  Final  Blood Culture ID Panel (Reflexed)     Status: Abnormal   Collection Time: 08/29/18 10:10 PM  Result Value Ref Range Status   Enterococcus species NOT DETECTED NOT DETECTED Final   Listeria monocytogenes NOT DETECTED NOT DETECTED Final   Staphylococcus species DETECTED (A) NOT DETECTED Final    Comment: CRITICAL RESULT CALLED TO, READ BACK BY AND VERIFIED WITH: PHARMD PIERCE, D 1723 [id number] FCP    Staphylococcus aureus (BCID) DETECTED (A) NOT DETECTED Final    Comment: Methicillin (oxacillin)-resistant Staphylococcus aureus (MRSA). MRSA is predictably resistant to beta-lactam antibiotics (except ceftaroline). Preferred therapy is vancomycin unless clinically contraindicated. Patient requires contact precautions if  hospitalized. CRITICAL RESULT CALLED TO, READ BACK BY AND VERIFIED WITH: PHARMD PIERCE, D 1723 [id number] FCP    Methicillin resistance DETECTED (A) NOT DETECTED Final    Comment: CRITICAL RESULT CALLED TO, READ BACK BY AND VERIFIED WITH: PHARMD PIERCE, D 1723 [id number] FCP    Streptococcus species NOT DETECTED NOT DETECTED Final   Streptococcus agalactiae NOT DETECTED NOT DETECTED Final   Streptococcus pneumoniae NOT DETECTED NOT DETECTED Final   Streptococcus pyogenes NOT DETECTED NOT DETECTED Final   Acinetobacter baumannii NOT DETECTED NOT DETECTED Final   Enterobacteriaceae species NOT DETECTED NOT DETECTED Final   Enterobacter cloacae complex NOT DETECTED NOT DETECTED Final   Escherichia coli NOT  DETECTED NOT DETECTED Final   Klebsiella oxytoca NOT DETECTED NOT DETECTED Final   Klebsiella pneumoniae NOT DETECTED NOT DETECTED Final   Proteus species NOT DETECTED NOT DETECTED Final   Serratia marcescens NOT DETECTED NOT DETECTED Final   Haemophilus influenzae NOT DETECTED NOT DETECTED Final   Neisseria meningitidis NOT DETECTED NOT DETECTED Final   Pseudomonas aeruginosa NOT DETECTED NOT DETECTED Final   Candida albicans NOT DETECTED NOT DETECTED Final   Candida glabrata NOT DETECTED NOT DETECTED Final   Candida krusei NOT DETECTED NOT DETECTED Final   Candida parapsilosis NOT DETECTED NOT DETECTED Final  Candida tropicalis NOT DETECTED NOT DETECTED Final    Comment: Performed at [facility], [place] [address] [place], [place] [address]  Blood culture (routine x 2)     Status: Abnormal   Collection Time: 08/29/18 10:16 PM  Result Value Ref Range Status   Specimen Description BLOOD RIGHT HAND  Final   Special Requests   Final    BOTTLES DRAWN AEROBIC ONLY Blood Culture results may not be optimal due to an inadequate volume of blood received in culture bottles   Culture  Setup Time   Final    GRAM POSITIVE COCCI AEROBIC BOTTLE ONLY CRITICAL RESULT CALLED TO, READ BACK BY AND VERIFIED WITH: Marland Mcalpine [id number] MLM Performed at [facility], [place] [address] [place], [place] [address]    Culture METHICILLIN RESISTANT STAPHYLOCOCCUS AUREUS (A)  Final   Report Status 09/01/2018 FINAL  Final   Organism ID, Bacteria METHICILLIN RESISTANT STAPHYLOCOCCUS AUREUS  Final      Susceptibility   Methicillin resistant staphylococcus aureus - MIC*    CIPROFLOXACIN <=0.5 SENSITIVE Sensitive     ERYTHROMYCIN >=8 RESISTANT Resistant     GENTAMICIN <=0.5 SENSITIVE Sensitive     OXACILLIN >=4 RESISTANT Resistant     TETRACYCLINE >=16 RESISTANT Resistant     VANCOMYCIN 1 SENSITIVE Sensitive     TRIMETH/SULFA <=10 SENSITIVE Sensitive     CLINDAMYCIN <=0.25 SENSITIVE Sensitive      RIFAMPIN <=0.5 SENSITIVE Sensitive     Inducible Clindamycin NEGATIVE Sensitive     * METHICILLIN RESISTANT STAPHYLOCOCCUS AUREUS  Blood Culture ID Panel (Reflexed)     Status: Abnormal   Collection Time: 08/29/18 10:16 PM  Result Value Ref Range Status   Enterococcus species NOT DETECTED NOT DETECTED Final   Listeria monocytogenes NOT DETECTED NOT DETECTED Final   Staphylococcus species DETECTED (A) NOT DETECTED Final    Comment: CRITICAL RESULT CALLED TO, READ BACK BY AND VERIFIED WITH: PHARMD J [place] [id number] MLM    Staphylococcus aureus (BCID) DETECTED (A) NOT DETECTED Final    Comment: Methicillin (oxacillin)-resistant Staphylococcus aureus (MRSA). MRSA is predictably resistant to beta-lactam antibiotics (except ceftaroline). Preferred therapy is vancomycin unless clinically contraindicated. Patient requires contact precautions if  hospitalized. CRITICAL RESULT CALLED TO, READ BACK BY AND VERIFIED WITH: PHARMD J [place] [id number] MLM    Methicillin resistance DETECTED (A) NOT DETECTED Final    Comment: CRITICAL RESULT CALLED TO, READ BACK BY AND VERIFIED WITH: PHARMD J [place] [id number] MLM    Streptococcus species NOT DETECTED NOT DETECTED Final   Streptococcus agalactiae NOT DETECTED NOT DETECTED Final   Streptococcus pneumoniae NOT DETECTED NOT DETECTED Final   Streptococcus pyogenes NOT DETECTED NOT DETECTED Final   Acinetobacter baumannii NOT DETECTED NOT DETECTED Final   Enterobacteriaceae species NOT DETECTED NOT DETECTED Final   Enterobacter cloacae complex NOT DETECTED NOT DETECTED Final   Escherichia coli NOT DETECTED NOT DETECTED Final   Klebsiella oxytoca NOT DETECTED NOT DETECTED Final   Klebsiella pneumoniae NOT DETECTED NOT DETECTED Final   Proteus species NOT DETECTED NOT DETECTED Final   Serratia marcescens NOT DETECTED NOT DETECTED Final   Haemophilus influenzae NOT DETECTED NOT DETECTED Final   Neisseria meningitidis NOT DETECTED NOT DETECTED Final    Pseudomonas aeruginosa NOT DETECTED NOT DETECTED Final   Candida albicans NOT DETECTED NOT DETECTED Final   Candida glabrata NOT DETECTED NOT DETECTED Final   Candida krusei NOT DETECTED NOT DETECTED Final   Candida parapsilosis  NOT DETECTED NOT DETECTED Final   Candida tropicalis NOT DETECTED NOT DETECTED Final    Comment: Performed at [facility], [place] [address], [place], [place] [address]  Wound or Superficial Culture     Status: None   Collection Time: 08/30/18 12:37 AM  Result Value Ref Range Status   Specimen Description ABSCESS  Final   Special Requests RIGHT FOOT  Final   Gram Stain   Final    FEW WBC PRESENT, PREDOMINANTLY PMN NO ORGANISMS SEEN Performed at [facility], [place] [address] [place], [place] [address]    Culture FEW METHICILLIN RESISTANT STAPHYLOCOCCUS AUREUS  Final   Report Status 09/01/2018 FINAL  Final   Organism ID, Bacteria METHICILLIN RESISTANT STAPHYLOCOCCUS AUREUS  Final      Susceptibility   Methicillin resistant staphylococcus aureus - MIC*    CIPROFLOXACIN <=0.5 SENSITIVE Sensitive     ERYTHROMYCIN >=8 RESISTANT Resistant     GENTAMICIN <=0.5 SENSITIVE Sensitive     OXACILLIN >=4 RESISTANT Resistant     TETRACYCLINE >=16 RESISTANT Resistant     VANCOMYCIN 1 SENSITIVE Sensitive     TRIMETH/SULFA <=10 SENSITIVE Sensitive     CLINDAMYCIN <=0.25 SENSITIVE Sensitive     RIFAMPIN <=0.5 SENSITIVE Sensitive     Inducible Clindamycin NEGATIVE Sensitive     * FEW METHICILLIN RESISTANT STAPHYLOCOCCUS AUREUS  Culture, blood (routine x 2)     Status: None (Preliminary result)   Collection Time: 08/31/18  1:30 PM  Result Value Ref Range Status   Specimen Description BLOOD RIGHT ARM  Final   Special Requests AEROBIC BOTTLE ONLY Blood Culture adequate volume  Final   Culture   Final    NO GROWTH 2 DAYS Performed at [facility], [place] [address] [place], [place] [address]    Report Status PENDING  Incomplete  Culture, blood (routine x 2)      Status: None (Preliminary result)   Collection Time: 08/31/18  2:00 PM  Result Value Ref Range Status   Specimen Description BLOOD RIGHT ANTECUBITAL  Final   Special Requests AEROBIC BOTTLE ONLY Blood Culture adequate volume  Final   Culture   Final    NO GROWTH 2 DAYS Performed at [facility], [place] [address] [place], [place] [address]    Report Status PENDING  Incomplete      ASSESSMENT & PLAN:   1. MRSA bacteremia, recurrent = relapsed bacteremia following prolonged 6-week course of therapy less than a month ago.  He is at high risk for endocardial involvement.  Transthoracic echocardiogram report pending.  I think we do have an alternative source explanation with regards to his right foot.  See #2.  Repeat blood cultures are pending and no growth x2 day  2. Osteomyelitis right calcaneus, talus, tibia = MRI of the right foot with new osteomyelitis of the distal fibula, talus, calcaneus and cuboid bones with possible subtalar and tibiotalar septic arthritis.  In comparison to his MRI at last admission all these findings are new despite having prolonged and appropriate treatment course with vancomycin for 6 weeks. Awaiting formal orthopedic evaluation with current MRI results for consideration of a transtibial amputation - Dr. Sharol Given is planning on coming to discuss further per chart notes.   3. Diabetes mellitus = HgbA1c 9% in February 2020.   4. End-stage renal disease on dialysis = on dialysis for 4 years now. Would like to discuss options for care with case manager as this is a  big factor in his decision making.   [employer] interpretor service was used throughout the visit.   Janene Madeira, MSN, NP-C [facility] [place] [facility] Pager: [phone]  09/02/2018  5:26 PM

## 2018-09-02 NOTE — Telephone Encounter (Signed)
Patient's family called stating that the patient would like for Dr. Sharol Given to come see him in the hospital.  CB#[phone].  Thank you.

## 2018-09-03 ENCOUNTER — Ambulatory Visit (INDEPENDENT_AMBULATORY_CARE_PROVIDER_SITE_OTHER): Payer: Self-pay | Admitting: Physician Assistant

## 2018-09-03 DIAGNOSIS — L02619 Cutaneous abscess of unspecified foot: Secondary | ICD-10-CM

## 2018-09-03 DIAGNOSIS — L02419 Cutaneous abscess of limb, unspecified: Secondary | ICD-10-CM

## 2018-09-03 LAB — BASIC METABOLIC PANEL
Anion gap: 13 (ref 5–15)
BUN: 43 mg/dL — ABNORMAL HIGH (ref 6–20)
CO2: 25 mmol/L (ref 22–32)
Calcium: 8.6 mg/dL — ABNORMAL LOW (ref 8.9–10.3)
Chloride: 95 mmol/L — ABNORMAL LOW (ref 98–111)
Creatinine, Ser: 8.13 mg/dL — ABNORMAL HIGH (ref 0.61–1.24)
GFR calc Af Amer: 8 mL/min — ABNORMAL LOW (ref >60)
GFR calc non Af Amer: 7 mL/min — ABNORMAL LOW (ref >60)
Glucose, Bld: 220 mg/dL — ABNORMAL HIGH (ref 70–99)
Potassium: 3.7 mmol/L (ref 3.5–5.1)
Sodium: 133 mmol/L — ABNORMAL LOW (ref 135–145)

## 2018-09-03 LAB — CBC WITH DIFFERENTIAL/PLATELET
Abs Immature Granulocytes: 0.16 10*3/uL — ABNORMAL HIGH (ref 0.00–0.07)
Basophils Absolute: 0.1 10*3/uL (ref 0.0–0.1)
Basophils Relative: 1 %
Eosinophils Absolute: 0.3 10*3/uL (ref 0.0–0.5)
Eosinophils Relative: 3 %
HCT: 28 % — ABNORMAL LOW (ref 39.0–52.0)
Hemoglobin: 8.8 g/dL — ABNORMAL LOW (ref 13.0–17.0)
Immature Granulocytes: 2 %
Lymphocytes Relative: 18 %
Lymphs Abs: 1.4 10*3/uL (ref 0.7–4.0)
MCH: 29.5 pg (ref 26.0–34.0)
MCHC: 31.4 g/dL (ref 30.0–36.0)
MCV: 94 fL (ref 80.0–100.0)
Monocytes Absolute: 0.7 10*3/uL (ref 0.1–1.0)
Monocytes Relative: 9 %
Neutro Abs: 5.1 10*3/uL (ref 1.7–7.7)
Neutrophils Relative %: 67 %
Platelets: 260 10*3/uL (ref 150–400)
RBC: 2.98 MIL/uL — ABNORMAL LOW (ref 4.22–5.81)
RDW: 15.3 % (ref 11.5–15.5)
WBC: 7.7 10*3/uL (ref 4.0–10.5)
nRBC: 0.3 % — ABNORMAL HIGH (ref 0.0–0.2)

## 2018-09-03 LAB — GLUCOSE, CAPILLARY
Glucose-Capillary: 206 mg/dL — ABNORMAL HIGH (ref 70–99)
Glucose-Capillary: 189 mg/dL — ABNORMAL HIGH (ref 70–99)
Glucose-Capillary: 113 mg/dL — ABNORMAL HIGH (ref 70–99)
Glucose-Capillary: 184 mg/dL — ABNORMAL HIGH (ref 70–99)

## 2018-09-03 LAB — VANCOMYCIN, RANDOM: Vancomycin Rm: 14

## 2018-09-03 MED ORDER — HEPARIN SODIUM (PORCINE) 1000 UNIT/ML DIALYSIS
2000.0000 [IU] | Freq: Once | INTRAMUSCULAR | Status: AC
  Administered 2018-09-03: 13:00:00 2000 [IU] via INTRAVENOUS_CENTRAL

## 2018-09-03 MED ORDER — HEPARIN SODIUM (PORCINE) 1000 UNIT/ML IJ SOLN
INTRAMUSCULAR | Status: AC
  Administered 2018-09-03: 2000 [IU] via INTRAVENOUS_CENTRAL
  Filled 2018-09-03: qty 2

## 2018-09-03 MED ORDER — VANCOMYCIN HCL IN DEXTROSE 500-5 MG/100ML-% IV SOLN
INTRAVENOUS | Status: AC
  Administered 2018-09-03: 500 mg via INTRAVENOUS
  Filled 2018-09-03: qty 100

## 2018-09-03 NOTE — Progress Notes (Addendum)
[facility] Progress Note   Subjective:   Seen on HD. Communication assisted by in-person interpreter. Patient is planned for R transtibial amputation tomorrow. He had many questions about why he needed this surgery, explained it was necessary due to osteomyelitis. Questions were answered to the best of my ability based on notes and patient was advised to follow up with Dr. Sharol Given or his primary team with any further questions. He does express frustration that people are not communicating with him and requests an interpretor be used for all provider interactions. From a renal standpoint, patient has no concerns. Denies SOB, dyspnea, CP, N/V/D. He is interested in kidney transplant, advised that we will need to discuss that with his primary nephrologist. Tolerating dialysis well.   Objective Vitals:   09/03/18 1215 09/03/18 1221 09/03/18 1226 09/03/18 1230  BP: 139/72 (!) 145/88 (!) 154/85 (!) 154/85  Pulse: 90 93 92 92  Resp: 16  18   Temp: 98 F (36.7 C)     TempSrc: Oral     SpO2: 98%     Weight: 52 kg      Physical Exam General:Well developed, alert male in NAD Heart:RRR, no murmurs, rubs or gallops Lungs:CTA bilaterally without wheezing, rhonchi or rales Abdomen:Soft, non-tender, non-distended. + BS. No rebound tenderness or guarding. Extremities:R ankle with bandage laterally, edematous/erythematous. Dialysis Access:LUE AVF  Additional Objective Labs: Basic Metabolic Panel: Recent Labs  Lab 08/31/18 0605 09/01/18 0434 09/03/18 0543  NA 134* 135 133*  K 4.0 4.0 3.7  CL 99 98 95*  CO2 22 21* 25  GLUCOSE 151* 125* 220*  BUN 54* 67* 43*  CREATININE 9.89* 11.70* 8.13*  CALCIUM 8.8* 9.3 8.6*   Liver Function Tests: Recent Labs  Lab 08/29/18 2202  AST 18  ALT 9  ALKPHOS 80  BILITOT 1.0  PROT 7.6  ALBUMIN 2.3*   No results for input(s): LIPASE, AMYLASE in the last 168 hours. CBC: Recent Labs  Lab 08/29/18 2202 08/30/18 0457 08/31/18 0605  09/01/18 0434 09/03/18 0543  WBC 12.0* 10.3 9.2 10.9* 7.7  NEUTROABS 11.2*  --   --   --  5.1  HGB 8.7* 7.5* 7.8* 7.3* 8.8*  HCT 28.8* 24.8* 25.0* 24.0* 28.0*  MCV 95.4 95.0 94.7 93.8 94.0  PLT 240 199 204 205 260   Blood Culture    Component Value Date/Time   SDES BLOOD RIGHT ANTECUBITAL 08/31/2018 1400   SPECREQUEST AEROBIC BOTTLE ONLY Blood Culture adequate volume 08/31/2018 1400   CULT  08/31/2018 1400    NO GROWTH 3 DAYS Performed at [name] [address] [place],  [address]    REPTSTATUS PENDING 08/31/2018 1400    CBG: Recent Labs  Lab 09/02/18 1132 09/02/18 1607 09/02/18 2053 09/03/18 0439 09/03/18 0658  GLUCAP 130* 316* 170* 206* 189*    Studies/Results: Vas [place] Abi With/wo Tbi  Result Date: 09/02/2018 LOWER EXTREMITY DOPPLER STUDY Indications: Ulceration.  Performing Technologist: Abram Sander RVS  Examination Guidelines: A complete evaluation includes at minimum, Doppler waveform signals and systolic blood pressure reading at the level of bilateral brachial, anterior tibial, and posterior tibial arteries, when vessel segments are accessible. Bilateral testing is considered an integral part of a complete examination. Photoelectric Plethysmograph (PPG) waveforms and toe systolic pressure readings are included as required and additional duplex testing as needed. Limited examinations for reoccurring indications may be performed as noted.  ABI Findings: +--------+------------------+-----+----------+--------+ Right   Rt Pressure (mmHg)IndexWaveform  Comment  +--------+------------------+-----+----------+--------+ [id number]  triphasic          +--------+------------------+-----+----------+--------+ PTA     171               1.49 monophasic         +--------+------------------+-----+----------+--------+ DP      158               1.37 triphasic          +--------+------------------+-----+----------+--------+  +--------+------------------+-----+--------+-----------------------------------+ Left    Lt Pressure (mmHg)IndexWaveformComment                             +--------+------------------+-----+--------+-----------------------------------+ Brachial                               unable to obtain due to upper                                              extremity restriction               +--------+------------------+-----+--------+-----------------------------------+ PTA                                    unable to obtain due to aka         +--------+------------------+-----+--------+-----------------------------------+ DP                                     unable to obtain due to aka         +--------+------------------+-----+--------+-----------------------------------+ +-------+-----------+-----------+------------+------------+ ABI/TBIToday's ABIToday's TBIPrevious ABIPrevious TBI +-------+-----------+-----------+------------+------------+ Right  1.49                                           +-------+-----------+-----------+------------+------------+  Summary: Right: Resting right ankle-brachial index indicates noncompressible right lower extremity arteries.  *See table(s) above for measurements and observations.  Electronically signed by Curt Jews MD on 09/02/2018 at 4:01:57 PM.   Final    Medications:  . bictegravir-emtricitabine-tenofovir AF  1 tablet Oral Daily  . Chlorhexidine Gluconate Cloth  6 each Topical Q0600  . Chlorhexidine Gluconate Cloth  6 each Topical Q0600  . darbepoetin (ARANESP) injection - DIALYSIS  200 mcg Intravenous Q Tue-HD  . feeding supplement (PRO-STAT SUGAR FREE 64)  30 mL Oral BID  . heparin  5,000 Units Subcutaneous Q8H  . insulin aspart  0-5 Units Subcutaneous QHS  . insulin aspart  0-9 Units Subcutaneous TID WC  . insulin glargine  5 Units Subcutaneous BID  . lidocaine (PF)  10 mL Infiltration Once  . midodrine  10 mg Oral Once  per day on Tue Thu Sat  . multivitamin  1 tablet Oral QHS  . vancomycin  500 mg Intravenous Q T,Th,Sa-HD    Dialysis Orders: [facility] 4[facility], 400/A1.5, EDW 52kg, 2K/2Ca, Linear Na, AVF, heparin 2000 bolus - Hectoral 63mcg IV q HD - Just finished course of Venofer - Mircera 155mcg IV q 2 weeks (last given 4/7)  Assessment/Plan: 1.Right ankle abscess.History of osteomyelitis in 06/2018. S/p I&D in ED. Blood culture +MRSA. Continue vancomycin per  primary. Per infectious disease notes, MRI confirmed osteomyelitis. Planned for amputation tomorrow.  2. ESRD:TTS schedule. No evidence of volume overload on exam, K+ 4.0. Last HD 4/21 with net UF 1.5L. Will plan for HD tomorrow on schedule. Interested in kidney transplant, advised that we will need to discuss with his primary outpatient nephrologist.  3.Hypotension/Volume: No evidence of volume overload on exam. BP chronically low during HD. Midodrine pre-HD on outpatient med list but has not been taking as he is unable to afford it.BP stable/slightly elevated today.  4. Anemia:Hgb 8.8. Mircera 150 mcg last dose 4/7. Given aranesp on 4.21.  5. Secondary hyperparathyroidism:Corrected calcium 9.9, hold hectorol for now- follow. Phos pending. 6. Nutrition:Albumin 2.3- will start supplement and renal vitamin.  7. T2DM:per primary 8. HIV:per primary/infectious disease  Anice Paganini, PA-C 09/03/2018, 12:49 PM  [facility] Pager: [phone]  I have seen and examined this patient and agree with plan and assessment in the above note with renal recommendations/intervention highlighted.  Patient was seen on dialysis and the procedure was supervised. BFR 400 Via AVF BP is stable.  Patient appears to be tolerating treatment well  Governor Rooks Melvenia Favela,MD 09/03/2018 2:05 PM

## 2018-09-03 NOTE — Progress Notes (Signed)
Inpatient Diabetes Program Recommendations  AACE/ADA: New Consensus Statement on Inpatient Glycemic Control (2015)  Target Ranges:  Prepandial:   less than 140 mg/dL      Peak postprandial:   less than 180 mg/dL (1-2 hours)      Critically ill patients:  140 - 180 mg/dL   Results for Alvin Daniels, Alvin Daniels (MRN [id number]) as of 09/03/2018 10:14  Ref. Range 09/02/2018 07:08 09/02/2018 11:32 09/02/2018 16:07 09/02/2018 20:53 09/03/2018 04:39 09/03/2018 06:58  Glucose-Capillary Latest Ref Range: 70 - 99 mg/dL 134 (H) 130 (H) 316 (H) 170 (H) 206 (H) 189 (H)    Review of Glycemic Control  Diabetes history: DM2 Outpatient Diabetes medications: NPH 70/30 15 units in am and 10 units in pm Current orders for Inpatient glycemic control: Lantus 5 units bid, Novolog 0-9 units tidwc and hs.  HgbA1C - 9.0% MD at [facility] is managing his diabetes   Inpatient Diabetes Program Recommendations:    Postprandial glucose increases in the 300 range. Consider Novolog 3 units tid meal coverage if patient consumes at least 50% of meals.  Will follow.  Thank you. Tama Headings RN, MSN, BC-ADM Inpatient Diabetes Coordinator Team Pager [phone] (8a-5p)

## 2018-09-03 NOTE — H&P (View-Only) (Signed)
ORTHOPAEDIC CONSULTATION  REQUESTING PHYSICIAN: Lavina Hamman, MD  Chief Complaint: Abscess ulcer lateral malleolus right ankle  HPI: Alvin Daniels is a 48 y.o. male who presents with abscess and osteomyelitis right foot.  Patient is status post a left transtibial amputation and presents with progressive abscess ulceration purulent drainage right ankle.  Past Medical History:  Diagnosis Date  . AIDS ([place]) 11/22/2014  . Chronic diarrhea   . Chronic hepatitis C without hepatic coma ([place]) 11/22/2014  . Diabetic neuropathy ([place])   . ESRD (end stage renal disease) on dialysis [facility])    "TTS; don't remember street name" (05/03/2014)  . Hepatitis C   . HIV INFECTION 06/27/2010   Qualifier: Diagnosis of  By: Nickola Major CMA ( [place]), Geni Bers    . Hypotension 06/02/2012  . Metabolic bone disease 12/19/3808  . MRSA infection   . Normocytic anemia 06/17/2012  . Pancreatitis   . Pressure ulcer of BKA stump, stage 2 ([place]) 11/22/2014  . Renal disorder   . Severe protein-calorie malnutrition ([place]) 06/17/2012  . Uncontrolled diabetes mellitus with complications ([place]) 1/75/1025   Annotation: uncontrolled Qualifier: Diagnosis of  By: Nickola Major CMA Deborra Medina), Geni Bers     Past Surgical History:  Procedure Laterality Date  . AMPUTATION Left 04/20/2014   Procedure: 3rd toe amputation, 4th Toe Amputation,  5th Toe Amputation;  Surgeon: Newt Minion, MD;  Location: [place];  Service: Orthopedics;  Laterality: Left;  . AMPUTATION Left 05/02/2014   Procedure: Midfoot Amputation;  Surgeon: Newt Minion, MD;  Location: [place];  Service: Orthopedics;  Laterality: Left;  . AMPUTATION Left 06/17/2014   Procedure: AMPUTATION BELOW KNEE;  Surgeon: Newt Minion, MD;  Location: [place];  Service: Orthopedics;  Laterality: Left;  . AV FISTULA PLACEMENT Left   . AV FISTULA PLACEMENT Left 05/10/2016   Procedure: Creation Left Arm Brachiocephalic Arteriovenous Fistula and Ligation of Radiocephalic Fistula;   Surgeon: Angelia Mould, MD;  Location: [place];  Service: Vascular;  Laterality: Left;  . FEMUR IM NAIL Left 08/17/2016   Procedure: INTRAMEDULLARY (IM) RETROGRADE FEMORAL NAILING;  Surgeon: Marybelle Killings, MD;  Location: [place];  Service: Orthopedics;  Laterality: Left;  . FOOT AMPUTATION THROUGH ANKLE Left 12/'21/2015   midfoot  . IR GENERIC HISTORICAL Left 05/01/2016   IR THROMBECTOMY AV FISTULA W/THROMBOLYSIS/PTA INC/SHUNT/IMG LEFT 05/01/2016 [name], MD [facility]  . IR GENERIC HISTORICAL  05/01/2016   IR US GUIDE VASC ACCESS LEFT 05/01/2016 [name], MD [facility]  . IR GENERIC HISTORICAL  05/07/2016   IR FLUORO GUIDE CV LINE RIGHT 05/07/2016 Corrie Mckusick, DO [facility]  . IR GENERIC HISTORICAL  05/07/2016   IR US GUIDE VASC ACCESS RIGHT 05/07/2016 Corrie Mckusick, DO [facility]  . IR GENERIC HISTORICAL  05/22/2016   IR US GUIDE VASC ACCESS RIGHT 05/22/2016 Greggory Keen, MD [facility]  . IR GENERIC HISTORICAL  05/22/2016   IR FLUORO GUIDE CV LINE RIGHT 05/22/2016 Greggory Keen, MD [facility]  . IR REMOVAL TUN CV CATH W/O FL  08/21/2016  . PERIPHERAL VASCULAR CATHETERIZATION Left 05/09/2016   Procedure: A/V Fistulagram;  Surgeon: Angelia Mould, MD;  Location: [place] [facility];  Service: Cardiovascular;  Laterality: Left;  arm  . TEE WITHOUT CARDIOVERSION N/A 06/22/2018   Procedure: TRANSESOPHAGEAL ECHOCARDIOGRAM (TEE);  Surgeon: Jerline Pain, MD;  Location: [facility];  Service: Cardiovascular;  Laterality: N/A;   Social History   Socioeconomic History  . Marital status: Single  Spouse name: Not on file  . Number of children: Not on file  . Years of education: Not on file  . Highest education level: Not on file  Occupational History  . Not on file  Social Needs  . Financial resource strain: Not on file  . Food insecurity:    Worry: Not on file    Inability: Not on file  . Transportation needs:    Medical: Not on file    Non-medical:  Not on file  Tobacco Use  . Smoking status: Never Smoker  . Smokeless tobacco: Never Used  Substance and Sexual Activity  . Alcohol use: No  . Drug use: No  . Sexual activity: Never  Lifestyle  . Physical activity:    Days per week: Not on file    Minutes per session: Not on file  . Stress: Not on file  Relationships  . Social connections:    Talks on phone: Not on file    Gets together: Not on file    Attends religious service: Not on file    Active member of club or organization: Not on file    Attends meetings of clubs or organizations: Not on file    Relationship status: Not on file  Other Topics Concern  . Not on file  Social History Narrative   ** Merged History Encounter **       Family History  Problem Relation Age of Onset  . Diabetes Mother   . Diabetes Father    - negative except otherwise stated in the family history section No Known Allergies Prior to Admission medications   Medication Sig Start Date End Date Taking? Authorizing Provider  acetaminophen (TYLENOL) 325 MG tablet Take 2 tablets (650 mg total) by mouth every 6 (six) hours as needed for mild pain. 06/23/18  Yes Mariel Aloe, MD  bictegravir-emtricitabine-tenofovir AF (BIKTARVY) 50-200-25 MG TABS tablet Take 1 tablet by mouth daily. 11/19/17  Yes Comer, Okey Regal, MD  insulin NPH-regular Human (70-30) 100 UNIT/ML injection Inject 10-15 Units into the skin See admin instructions. Inject 15 units subcutaneous in the a.m. and 10 units subcutaneous in the p.m. with meals    Yes [provider]   Vas [place] Ab[place] With/wo Tbi  Result Date: 09/02/2018 LOWER EXTREMITY DOPPLER STUDY Indications: Ulceration.  Performing Technologist: Abram Sander RVS  Examination Guidelines: A complete evaluation includes at minimum, Doppler waveform signals and systolic blood pressure reading at the level of bilateral brachial, anterior tibial, and posterior tibial arteries, when vessel segments are accessible. Bilateral  testing is considered an integral part of a complete examination. Photoelectric Plethysmograph (PPG) waveforms and toe systolic pressure readings are included as required and additional duplex testing as needed. Limited examinations for reoccurring indications may be performed as noted.  ABI Findings: +--------+------------------+-----+----------+--------+ Right   Rt Pressure (mmHg)IndexWaveform  Comment  +--------+------------------+-----+----------+--------+ [id number]                    triphasic          +--------+------------------+-----+----------+--------+ PTA     171               1.49 monophasic         +--------+------------------+-----+----------+--------+ DP      158               1.37 triphasic          +--------+------------------+-----+----------+--------+ +--------+------------------+-----+--------+-----------------------------------+ Left    Lt Pressure (mmHg)IndexWaveformComment                             +--------+------------------+-----+--------+-----------------------------------+  Brachial                               unable to obtain due to upper                                              extremity restriction               +--------+------------------+-----+--------+-----------------------------------+ PTA                                    unable to obtain due to aka         +--------+------------------+-----+--------+-----------------------------------+ DP                                     unable to obtain due to aka         +--------+------------------+-----+--------+-----------------------------------+ +-------+-----------+-----------+------------+------------+ ABI/TBIToday's ABIToday's TBIPrevious ABIPrevious TBI +-------+-----------+-----------+------------+------------+ Right  1.49                                           +-------+-----------+-----------+------------+------------+  Summary: Right: Resting right  ankle-brachial index indicates noncompressible right lower extremity arteries.  *See table(s) above for measurements and observations.  Electronically signed by Curt Jews MD on 09/02/2018 at 4:01:57 PM.   Final    - pertinent xrays, CT, MRI studies were reviewed and independently interpreted  Positive ROS: All other systems have been reviewed and were otherwise negative with the exception of those mentioned in the HPI and as above.  Physical Exam: General: Alert, no acute distress Psychiatric: Patient is competent for consent with normal mood and affect Lymphatic: No axillary or cervical lymphadenopathy Cardiovascular: No pedal edema Respiratory: No cyanosis, no use of accessory musculature GI: No organomegaly, abdomen is soft and non-tender    Images:  @ENCIMAGES @  Labs:  Lab Results  Component Value Date   HGBA1C 9.0 (H) 06/18/2018   HGBA1C 13.4 (H) 11/19/2017   HGBA1C 5.0 05/24/2016   ESRSEDRATE 126 (H) 08/29/2018   ESRSEDRATE 132 (H) 08/15/2016   ESRSEDRATE 84 (H) 06/10/2014   CRP 29.8 (H) 08/29/2018   CRP 28.3 (H) 08/15/2016   CRP 17.8 (H) 04/18/2014   REPTSTATUS PENDING 08/31/2018   GRAMSTAIN  08/30/2018    FEW WBC PRESENT, PREDOMINANTLY PMN NO ORGANISMS SEEN Performed at [facility], [address] [place], [place] [address]    CULT  08/31/2018    NO GROWTH 2 DAYS Performed at [facility] [address] [place], [place] [address]    LABORGA METHICILLIN RESISTANT STAPHYLOCOCCUS AUREUS 08/30/2018    Lab Results  Component Value Date   ALBUMIN 2.3 (L) 08/29/2018   ALBUMIN 2.5 (L) 08/27/2018   ALBUMIN 2.1 (L) 06/23/2018   PREALBUMIN 9.4 (L) 08/29/2018    Neurologic: Patient does not have protective sensation bilateral lower extremities.   MUSCULOSKELETAL:   Skin: Examination patient has macerated swelling abscess in the lateral malleolus with purulent drainage.  Patient's leg is thin and atrophic he has a stable left above-the-knee  amputation.  The MRI scan was reviewed which  shows a large abscess over the fibula with underlying osteomyelitis as well as an abscess in the fourth webspace and abscess over the plantar aspect of the foot.  Assessment: Assessment: Diabetic insensate neuropathy end-stage renal disease on dialysis with abscess osteomyelitis right ankle and foot.  Plan: Plan: Patient will require a transtibial amputation on the right.  He states he has dialysis today.  We will plan for surgery tomorrow, Friday.  The ongoing interpreter was used patient states that all his questions were answered he was agreeable to proceed with surgery.  He states he will need a wheelchair postoperatively.  Thank you for the consult and the opportunity to see Alvin Daniels, [place] [phone] 7:51 AM

## 2018-09-03 NOTE — Progress Notes (Addendum)
[facility] Progress Note  Patient: Alvin Daniels KZS:[id number]   PCP: [name], MD DOB: 1970-11-07   DOA: 08/29/2018   DOS: 09/03/2018   Date of Service: the patient was seen and examined on 09/03/2018  Brief hospital course: Pt. with PMH of ESRD on HD MWF, type II DM, HIV, HCV, left BKA, recent MRSA bacteremia; admitted on 08/29/2018, presented with complaint of abscess, was found to have MRSA bacteremia with osteomyelitis. Currently further plan is continue IV antibiotics, monitor Ortho recommendation.  Subjective: No acute complaint no nausea no vomiting.  Asking different provider's impression again again regarding amputation and the length and extent.  Assessment and Plan: 1.  MRSA bacteremia Right ankle abscess with Charcot's joint. Osteomyelitis of right foot. Presents with drainage from right foot ulcer, bedside I&D was performed in the ER.  Cultures were sent out which grew MRSA. Blood cultures were performed which also grew MRSA on admission. Repeat cultures so far negative. ID consulted. Echocardiogram-TTE negative for any significant vegetation or regurgitation.  Not sure whether patient requires a TEE as he already has osteomyelitis. Will defer to ID. Started on IV vancomycin with HD. Initial CT scan was suggestive of no evidence of septic joint although an MRI is positive for osteomyelitis of distal fibula, talus, calcaneus, cuboid which is new since his last MRI 2 months ago. Orthopedic Dr. Sharol Given consulted, BKA scheduled tomorrow. Resting right ABI indicates noncompressible arteries.  2. ESRD on HD. Anemia of chronic kidney disease Appreciate nephrology assistance. Hemodialysis on TTS. Hemoglobin currently stable. Management of anemia per nephrology.  3.  HIV. CD4 count dropped from 300-200. Current recommendation is to continue Biktarvy.  4. Chronic hypotension. Patient is on midodrine which I will continue. Blood pressure currently high 90s  SBP. Asymptomatic. Monitor.  5. Type 2 Diabetes Mellitus, uncontroled with renal and neuropathic complication last hemoglobin A1c was 9.0 in 06/18/2018. On insulin sliding scale sensitive with Basal insulin lantus.  6. Increased nutrient needs related to wound healing Body mass index is 21.57 kg/m.  Nutrition Problem: Increased nutrient needs Etiology: wound healing Interventions: Interventions: Prostat  Pressure Injury 08/21/16 (Active)  08/21/16 2020  Location: [place]  Location Orientation: Right  Staging:   Wound Description (Comments):   Present on Admission:      Diet: renal diet DVT Prophylaxis: subcutaneous Heparin  Advance goals of care discussion: full code  Family Communication: no family was present at bedside, at the time of interview.  Discussed with brother on the phone.  Disposition:  Discharge to be determined.  Consultants: Infectious disease, orthopedic Dr. Sharol Given, nephrology Procedures: ABI  Scheduled Meds: . bictegravir-emtricitabine-tenofovir AF  1 tablet Oral Daily  . Chlorhexidine Gluconate Cloth  6 each Topical Q0600  . Chlorhexidine Gluconate Cloth  6 each Topical Q0600  . darbepoetin (ARANESP) injection - DIALYSIS  200 mcg Intravenous Q Tue-HD  . feeding supplement (PRO-STAT SUGAR FREE 64)  30 mL Oral BID  . heparin  5,000 Units Subcutaneous Q8H  . insulin aspart  0-5 Units Subcutaneous QHS  . insulin aspart  0-9 Units Subcutaneous TID WC  . insulin glargine  5 Units Subcutaneous BID  . lidocaine (PF)  10 mL Infiltration Once  . midodrine  10 mg Oral Once per day on Tue Thu Sat  . multivitamin  1 tablet Oral QHS  . vancomycin  500 mg Intravenous Q T,Th,Sa-HD   Continuous Infusions: PRN Meds: acetaminophen **OR** acetaminophen, HYDROcodone-acetaminophen, methocarbamol, ondansetron **OR** ondansetron (ZOFRAN) IV Antibiotics: Anti-infectives (From admission, onward)  Start     Dose/Rate Route Frequency Ordered Stop   09/01/18 1200   vancomycin (VANCOCIN) IVPB 500 mg/100 ml premix     500 mg 100 mL/hr over 60 Minutes Intravenous Every T-Th-Sa (Hemodialysis) 08/30/18 0914     08/30/18 1000  bictegravir-emtricitabine-tenofovir AF (BIKTARVY) 50-200-25 MG per tablet 1 tablet     1 tablet Oral Daily 08/29/18 2353     08/30/18 0600  cefTRIAXone (ROCEPHIN) 2 g in sodium chloride 0.9 % 100 mL IVPB  Status:  Discontinued     2 g 200 mL/hr over 30 Minutes Intravenous Every 24 hours 08/29/18 2358 08/30/18 1829   08/30/18 0600  metroNIDAZOLE (FLAGYL) IVPB 500 mg  Status:  Discontinued     500 mg 100 mL/hr over 60 Minutes Intravenous Every 8 hours 08/29/18 2358 08/30/18 1829   08/29/18 2315  vancomycin (VANCOCIN) 1,250 mg in sodium chloride 0.9 % 250 mL IVPB     1,250 mg 166.7 mL/hr over 90 Minutes Intravenous  Once 08/29/18 2303 08/30/18 0206   08/29/18 2315  ampicillin-sulbactam (UNASYN) 1.5 g in sodium chloride 0.9 % 100 mL IVPB  Status:  Discontinued     1.5 g 200 mL/hr over 30 Minutes Intravenous  Once 08/29/18 2303 08/29/18 2304   08/29/18 2315  piperacillin-tazobactam (ZOSYN) IVPB 2.25 g  Status:  Discontinued     2.25 g 100 mL/hr over 30 Minutes Intravenous  Once 08/29/18 2304 08/29/18 2307   08/29/18 2315  piperacillin-tazobactam (ZOSYN) IVPB 3.375 g     3.375 g 100 mL/hr over 30 Minutes Intravenous  Once 08/29/18 2307 08/30/18 0022       Objective: Physical Exam: Vitals:   09/03/18 1600 09/03/18 1615 09/03/18 1622 09/03/18 1737  BP: 113/70 127/77 122/74 119/69  Pulse: 91 93 91 97  Resp:   17   Temp:   98.5 F (36.9 C) 98.7 F (37.1 C)  TempSrc:   Oral Oral  SpO2:   98% 98%  Weight:   50.1 kg     Intake/Output Summary (Last 24 hours) at 09/03/2018 1916 Last data filed at 09/03/2018 1622 Gross per 24 hour  Intake 240 ml  Output 1500 ml  Net -1260 ml   Filed Weights   09/03/18 0500 09/03/18 1215 09/03/18 1622  Weight: 51 kg 52 kg 50.1 kg   General: Alert, Awake and Oriented to Time, Place and Person.  Appear in mild distress, affect appropriate Eyes: PERRL, Conjunctiva normal ENT: Oral Mucosa clear moist. Neck: no JVD, no Abnormal Mass Or lumps Cardiovascular: S1 and S2 Present, no Murmur, Peripheral Pulses Present Respiratory: normal respiratory effort, Bilateral Air entry equal and Decreased, no use of accessory muscle, Clear to Auscultation, no Crackles, no wheezes Abdomen: Bowel Sound present, Soft and no tenderness, no hernia Skin: no redness, no Rash, no induration Extremities: right Pedal edema, right calf tenderness left BKA Neurologic: Grossly no focal neuro deficit. Bilaterally Equal motor strength  Data Reviewed: CBC: Recent Labs  Lab 08/29/18 2202 08/30/18 0457 08/31/18 0605 09/01/18 0434 09/03/18 0543  WBC 12.0* 10.3 9.2 10.9* 7.7  NEUTROABS 11.2*  --   --   --  5.1  HGB 8.7* 7.5* 7.8* 7.3* 8.8*  HCT 28.8* 24.8* 25.0* 24.0* 28.0*  MCV 95.4 95.0 94.7 93.8 94.0  PLT 240 199 204 205 053   Basic Metabolic Panel: Recent Labs  Lab 08/29/18 2202 08/30/18 0457 08/31/18 0605 09/01/18 0434 09/03/18 0543  NA 138 136 134* 135 133*  K 3.6 3.7 4.0 4.0 3.7  CL 100 100 99 98 95*  CO2 24 22 22  21* 25  GLUCOSE 355* 199* 151* 125* 220*  BUN 35* 38* 54* 67* 43*  CREATININE 7.59* 8.01* 9.89* 11.70* 8.13*  CALCIUM 8.2* 8.0* 8.8* 9.3 8.6*    Liver Function Tests: Recent Labs  Lab 08/29/18 2202  AST 18  ALT 9  ALKPHOS 80  BILITOT 1.0  PROT 7.6  ALBUMIN 2.3*   No results for input(s): LIPASE, AMYLASE in the last 168 hours. No results for input(s): AMMONIA in the last 168 hours. Coagulation Profile: No results for input(s): INR, PROTIME in the last 168 hours. Cardiac Enzymes: No results for input(s): CKTOTAL, CKMB, CKMBINDEX, TROPONINI in the last 168 hours. BNP (last 3 results) No results for input(s): PROBNP in the last 8760 hours. CBG: Recent Labs  Lab 09/02/18 1607 09/02/18 2053 09/03/18 0439 09/03/18 0658 09/03/18 1734  GLUCAP 316* 170* 206* 189*  113*   Studies: No results found.   Time spent: 35 minutes  Author: Berle Mull, MD [facility] 09/03/2018 7:16 PM  To reach On-call, see care teams to locate the attending and reach out to them via [email or web address]. If 7PM-7AM, please contact night-coverage If you still have difficulty reaching the attending provider, please page the [facility] (Director on Call) for [facility] on amion for assistance.

## 2018-09-03 NOTE — Consult Note (Signed)
ORTHOPAEDIC CONSULTATION  REQUESTING PHYSICIAN: Lavina Hamman, MD  Chief Complaint: Abscess ulcer lateral malleolus right ankle  HPI: Alvin Daniels is a 48 y.o. male who presents with abscess and osteomyelitis right foot.  Patient is status post a left transtibial amputation and presents with progressive abscess ulceration purulent drainage right ankle.  Past Medical History:  Diagnosis Date  . AIDS ([place]) 11/22/2014  . Chronic diarrhea   . Chronic hepatitis C without hepatic coma ([place]) 11/22/2014  . Diabetic neuropathy ([place])   . ESRD (end stage renal disease) on dialysis [facility])    "TTS; don't remember street name" (05/03/2014)  . Hepatitis C   . HIV INFECTION 06/27/2010   Qualifier: Diagnosis of  By: Nickola Major CMA ( [place]), Geni Bers    . Hypotension 06/02/2012  . Metabolic bone disease 8/0/9983  . MRSA infection   . Normocytic anemia 06/17/2012  . Pancreatitis   . Pressure ulcer of BKA stump, stage 2 ([place]) 11/22/2014  . Renal disorder   . Severe protein-calorie malnutrition ([place]) 06/17/2012  . Uncontrolled diabetes mellitus with complications ([place]) 3/82/5053   Annotation: uncontrolled Qualifier: Diagnosis of  By: Nickola Major CMA Deborra Medina), Geni Bers     Past Surgical History:  Procedure Laterality Date  . AMPUTATION Left 04/20/2014   Procedure: 3rd toe amputation, 4th Toe Amputation,  5th Toe Amputation;  Surgeon: Newt Minion, MD;  Location: [place];  Service: Orthopedics;  Laterality: Left;  . AMPUTATION Left 05/02/2014   Procedure: Midfoot Amputation;  Surgeon: Newt Minion, MD;  Location: [place];  Service: Orthopedics;  Laterality: Left;  . AMPUTATION Left 06/17/2014   Procedure: AMPUTATION BELOW KNEE;  Surgeon: Newt Minion, MD;  Location: [place];  Service: Orthopedics;  Laterality: Left;  . AV FISTULA PLACEMENT Left   . AV FISTULA PLACEMENT Left 05/10/2016   Procedure: Creation Left Arm Brachiocephalic Arteriovenous Fistula and Ligation of Radiocephalic Fistula;   Surgeon: Angelia Mould, MD;  Location: [place];  Service: Vascular;  Laterality: Left;  . FEMUR IM NAIL Left 08/17/2016   Procedure: INTRAMEDULLARY (IM) RETROGRADE FEMORAL NAILING;  Surgeon: Marybelle Killings, MD;  Location: [place];  Service: Orthopedics;  Laterality: Left;  . FOOT AMPUTATION THROUGH ANKLE Left 12/'21/2015   midfoot  . IR GENERIC HISTORICAL Left 05/01/2016   IR THROMBECTOMY AV FISTULA W/THROMBOLYSIS/PTA INC/SHUNT/IMG LEFT 05/01/2016 [name], MD [facility]  . IR GENERIC HISTORICAL  05/01/2016   IR US GUIDE VASC ACCESS LEFT 05/01/2016 [name], MD [facility]  . IR GENERIC HISTORICAL  05/07/2016   IR FLUORO GUIDE CV LINE RIGHT 05/07/2016 Corrie Mckusick, DO [facility]  . IR GENERIC HISTORICAL  05/07/2016   IR US GUIDE VASC ACCESS RIGHT 05/07/2016 Corrie Mckusick, DO [facility]  . IR GENERIC HISTORICAL  05/22/2016   IR US GUIDE VASC ACCESS RIGHT 05/22/2016 Greggory Keen, MD [facility]  . IR GENERIC HISTORICAL  05/22/2016   IR FLUORO GUIDE CV LINE RIGHT 05/22/2016 Greggory Keen, MD [facility]  . IR REMOVAL TUN CV CATH W/O FL  08/21/2016  . PERIPHERAL VASCULAR CATHETERIZATION Left 05/09/2016   Procedure: A/V Fistulagram;  Surgeon: Angelia Mould, MD;  Location: [place] [facility];  Service: Cardiovascular;  Laterality: Left;  arm  . TEE WITHOUT CARDIOVERSION N/A 06/22/2018   Procedure: TRANSESOPHAGEAL ECHOCARDIOGRAM (TEE);  Surgeon: Jerline Pain, MD;  Location: [facility] ENDOSCOPY;  Service: Cardiovascular;  Laterality: N/A;   Social History   Socioeconomic History  . Marital status: Single  Spouse name: Not on file  . Number of children: Not on file  . Years of education: Not on file  . Highest education level: Not on file  Occupational History  . Not on file  Social Needs  . Financial resource strain: Not on file  . Food insecurity:    Worry: Not on file    Inability: Not on file  . Transportation needs:    Medical: Not on file    Non-medical:  Not on file  Tobacco Use  . Smoking status: Never Smoker  . Smokeless tobacco: Never Used  Substance and Sexual Activity  . Alcohol use: No  . Drug use: No  . Sexual activity: Never  Lifestyle  . Physical activity:    Days per week: Not on file    Minutes per session: Not on file  . Stress: Not on file  Relationships  . Social connections:    Talks on phone: Not on file    Gets together: Not on file    Attends religious service: Not on file    Active member of club or organization: Not on file    Attends meetings of clubs or organizations: Not on file    Relationship status: Not on file  Other Topics Concern  . Not on file  Social History Narrative   ** Merged History Encounter **       Family History  Problem Relation Age of Onset  . Diabetes Mother   . Diabetes Father    - negative except otherwise stated in the family history section No Known Allergies Prior to Admission medications   Medication Sig Start Date End Date Taking? Authorizing Provider  acetaminophen (TYLENOL) 325 MG tablet Take 2 tablets (650 mg total) by mouth every 6 (six) hours as needed for mild pain. 06/23/18  Yes Mariel Aloe, MD  bictegravir-emtricitabine-tenofovir AF (BIKTARVY) 50-200-25 MG TABS tablet Take 1 tablet by mouth daily. 11/19/17  Yes Comer, Okey Regal, MD  insulin NPH-regular Human (70-30) 100 UNIT/ML injection Inject 10-15 Units into the skin See admin instructions. Inject 15 units subcutaneous in the a.m. and 10 units subcutaneous in the p.m. with meals    Yes [provider]   Vas [place] Abi With/wo Tbi  Result Date: 09/02/2018 LOWER EXTREMITY DOPPLER STUDY Indications: Ulceration.  Performing Technologist: Abram Sander RVS  Examination Guidelines: A complete evaluation includes at minimum, Doppler waveform signals and systolic blood pressure reading at the level of bilateral brachial, anterior tibial, and posterior tibial arteries, when vessel segments are accessible. Bilateral  testing is considered an integral part of a complete examination. Photoelectric Plethysmograph (PPG) waveforms and toe systolic pressure readings are included as required and additional duplex testing as needed. Limited examinations for reoccurring indications may be performed as noted.  ABI Findings: +--------+------------------+-----+----------+--------+ Right   Rt Pressure (mmHg)IndexWaveform  Comment  +--------+------------------+-----+----------+--------+ [id number]                    triphasic          +--------+------------------+-----+----------+--------+ PTA     171               1.49 monophasic         +--------+------------------+-----+----------+--------+ DP      158               1.37 triphasic          +--------+------------------+-----+----------+--------+ +--------+------------------+-----+--------+-----------------------------------+ Left    Lt Pressure (mmHg)IndexWaveformComment                             +--------+------------------+-----+--------+-----------------------------------+  Brachial                               unable to obtain due to upper                                              extremity restriction               +--------+------------------+-----+--------+-----------------------------------+ PTA                                    unable to obtain due to aka         +--------+------------------+-----+--------+-----------------------------------+ DP                                     unable to obtain due to aka         +--------+------------------+-----+--------+-----------------------------------+ +-------+-----------+-----------+------------+------------+ ABI/TBIToday's ABIToday's TBIPrevious ABIPrevious TBI +-------+-----------+-----------+------------+------------+ Right  1.49                                           +-------+-----------+-----------+------------+------------+  Summary: Right: Resting right  ankle-brachial index indicates noncompressible right lower extremity arteries.  *See table(s) above for measurements and observations.  Electronically signed by Curt Jews MD on 09/02/2018 at 4:01:57 PM.   Final    - pertinent xrays, CT, MRI studies were reviewed and independently interpreted  Positive ROS: All other systems have been reviewed and were otherwise negative with the exception of those mentioned in the HPI and as above.  Physical Exam: General: Alert, no acute distress Psychiatric: Patient is competent for consent with normal mood and affect Lymphatic: No axillary or cervical lymphadenopathy Cardiovascular: No pedal edema Respiratory: No cyanosis, no use of accessory musculature GI: No organomegaly, abdomen is soft and non-tender    Images:  @ENCIMAGES @  Labs:  Lab Results  Component Value Date   HGBA1C 9.0 (H) 06/18/2018   HGBA1C 13.4 (H) 11/19/2017   HGBA1C 5.0 05/24/2016   ESRSEDRATE 126 (H) 08/29/2018   ESRSEDRATE 132 (H) 08/15/2016   ESRSEDRATE 84 (H) 06/10/2014   CRP 29.8 (H) 08/29/2018   CRP 28.3 (H) 08/15/2016   CRP 17.8 (H) 04/18/2014   REPTSTATUS PENDING 08/31/2018   GRAMSTAIN  08/30/2018    FEW WBC PRESENT, PREDOMINANTLY PMN NO ORGANISMS SEEN Performed at [facility], [address] [place], [place] [address]    CULT  08/31/2018    NO GROWTH 2 DAYS Performed at [facility] [address] [place], [place] [address]    LABORGA METHICILLIN RESISTANT STAPHYLOCOCCUS AUREUS 08/30/2018    Lab Results  Component Value Date   ALBUMIN 2.3 (L) 08/29/2018   ALBUMIN 2.5 (L) 08/27/2018   ALBUMIN 2.1 (L) 06/23/2018   PREALBUMIN 9.4 (L) 08/29/2018    Neurologic: Patient does not have protective sensation bilateral lower extremities.   MUSCULOSKELETAL:   Skin: Examination patient has macerated swelling abscess in the lateral malleolus with purulent drainage.  Patient's leg is thin and atrophic he has a stable left above-the-knee  amputation.  The MRI scan was reviewed which  shows a large abscess over the fibula with underlying osteomyelitis as well as an abscess in the fourth webspace and abscess over the plantar aspect of the foot.  Assessment: Assessment: Diabetic insensate neuropathy end-stage renal disease on dialysis with abscess osteomyelitis right ankle and foot.  Plan: Plan: Patient will require a transtibial amputation on the right.  He states he has dialysis today.  We will plan for surgery tomorrow, Friday.  The ongoing interpreter was used patient states that all his questions were answered he was agreeable to proceed with surgery.  He states he will need a wheelchair postoperatively.  Thank you for the consult and the opportunity to see [name], [place] [phone] 7:51 AM

## 2018-09-03 NOTE — Care Management (Signed)
Has follow up at [facility] on Wednesday May 6th at 0930. NEEDS [facility] to be called on 4/29 to set up transportation to this appointment. [phone]. They cannot schedule it more than 7 days out from apt time.

## 2018-09-04 ENCOUNTER — Inpatient Hospital Stay (HOSPITAL_COMMUNITY): Payer: Medicaid Other | Admitting: Anesthesiology

## 2018-09-04 ENCOUNTER — Encounter (HOSPITAL_COMMUNITY): Payer: Self-pay

## 2018-09-04 ENCOUNTER — Encounter (HOSPITAL_COMMUNITY)
Admission: EM | Disposition: A | Discharge status: Home Health | Payer: Self-pay | Source: Home / Self Care | Attending: Internal Medicine

## 2018-09-04 DIAGNOSIS — A4102 Sepsis due to Methicillin resistant Staphylococcus aureus: Principal | ICD-10-CM

## 2018-09-04 DIAGNOSIS — E1169 Type 2 diabetes mellitus with other specified complication: Secondary | ICD-10-CM

## 2018-09-04 HISTORY — PX: AMPUTATION: SHX166

## 2018-09-04 LAB — BASIC METABOLIC PANEL
Anion gap: 13 (ref 5–15)
BUN: 19 mg/dL (ref 6–20)
CO2: 27 mmol/L (ref 22–32)
Calcium: 8.6 mg/dL — ABNORMAL LOW (ref 8.9–10.3)
Chloride: 96 mmol/L — ABNORMAL LOW (ref 98–111)
Creatinine, Ser: 5.3 mg/dL — ABNORMAL HIGH (ref 0.61–1.24)
GFR calc Af Amer: 14 mL/min — ABNORMAL LOW (ref >60)
GFR calc non Af Amer: 12 mL/min — ABNORMAL LOW (ref >60)
Glucose, Bld: 146 mg/dL — ABNORMAL HIGH (ref 70–99)
Potassium: 3.9 mmol/L (ref 3.5–5.1)
Sodium: 136 mmol/L (ref 135–145)

## 2018-09-04 LAB — GLUCOSE, CAPILLARY
Glucose-Capillary: 165 mg/dL — ABNORMAL HIGH (ref 70–99)
Glucose-Capillary: 142 mg/dL — ABNORMAL HIGH (ref 70–99)
Glucose-Capillary: 123 mg/dL — ABNORMAL HIGH (ref 70–99)
Glucose-Capillary: 137 mg/dL — ABNORMAL HIGH (ref 70–99)
Glucose-Capillary: 140 mg/dL — ABNORMAL HIGH (ref 70–99)
Glucose-Capillary: 141 mg/dL — ABNORMAL HIGH (ref 70–99)
Glucose-Capillary: 138 mg/dL — ABNORMAL HIGH (ref 70–99)
Glucose-Capillary: 331 mg/dL — ABNORMAL HIGH (ref 70–99)

## 2018-09-04 LAB — SURGICAL PCR SCREEN
MRSA, PCR: NEGATIVE
Staphylococcus aureus: NEGATIVE

## 2018-09-04 SURGERY — AMPUTATION BELOW KNEE
Anesthesia: General | Laterality: Right

## 2018-09-04 MED ORDER — METHOCARBAMOL 1000 MG/10ML IJ SOLN
500.0000 mg | Freq: Four times a day (QID) | INTRAVENOUS | Status: DC | PRN
  Filled 2018-09-04: qty 5

## 2018-09-04 MED ORDER — SODIUM CHLORIDE 0.9 % IV SOLN
INTRAVENOUS | Status: DC
  Administered 2018-09-04: 11:00:00 via INTRAVENOUS

## 2018-09-04 MED ORDER — FENTANYL CITRATE (PF) 250 MCG/5ML IJ SOLN
INTRAMUSCULAR | Status: AC
  Filled 2018-09-04: qty 5

## 2018-09-04 MED ORDER — DOCUSATE SODIUM 100 MG PO CAPS
100.0000 mg | ORAL_CAPSULE | Freq: Two times a day (BID) | ORAL | Status: DC
  Administered 2018-09-04 – 2018-09-06 (×5): 100 mg via ORAL
  Filled 2018-09-04 (×6): qty 1

## 2018-09-04 MED ORDER — METHOCARBAMOL 500 MG PO TABS
500.0000 mg | ORAL_TABLET | Freq: Four times a day (QID) | ORAL | Status: DC | PRN
  Administered 2018-09-05 – 2018-09-06 (×2): 500 mg via ORAL
  Filled 2018-09-04 (×2): qty 1

## 2018-09-04 MED ORDER — ONDANSETRON HCL 4 MG/2ML IJ SOLN
INTRAMUSCULAR | Status: AC
  Filled 2018-09-04: qty 2

## 2018-09-04 MED ORDER — CHLORHEXIDINE GLUCONATE 4 % EX LIQD
60.0000 mL | Freq: Once | CUTANEOUS | Status: DC
  Filled 2018-09-04: qty 60

## 2018-09-04 MED ORDER — MIDAZOLAM HCL 5 MG/5ML IJ SOLN
INTRAMUSCULAR | Status: DC | PRN
  Administered 2018-09-04: 2 mg via INTRAVENOUS

## 2018-09-04 MED ORDER — ONDANSETRON HCL 4 MG/2ML IJ SOLN: 4.0000 mg | Freq: Four times a day (QID) | INTRAMUSCULAR | Status: DC | PRN

## 2018-09-04 MED ORDER — OXYCODONE HCL 5 MG PO TABS: 5.0000 mg | ORAL_TABLET | ORAL | Status: DC | PRN

## 2018-09-04 MED ORDER — CHLORHEXIDINE GLUCONATE CLOTH 2 % EX PADS
6.0000 | MEDICATED_PAD | Freq: Every day | CUTANEOUS | Status: DC
  Administered 2018-09-04 – 2018-09-07 (×3): 6 via TOPICAL

## 2018-09-04 MED ORDER — FENTANYL CITRATE (PF) 100 MCG/2ML IJ SOLN: 25.0000 ug | INTRAMUSCULAR | Status: DC | PRN

## 2018-09-04 MED ORDER — LIDOCAINE 2% (20 MG/ML) 5 ML SYRINGE
INTRAMUSCULAR | Status: AC
  Filled 2018-09-04: qty 5

## 2018-09-04 MED ORDER — SODIUM CHLORIDE 0.9 % IV SOLN: INTRAVENOUS | Status: DC

## 2018-09-04 MED ORDER — ACETAMINOPHEN 325 MG PO TABS: 325.0000 mg | ORAL_TABLET | Freq: Four times a day (QID) | ORAL | Status: DC | PRN

## 2018-09-04 MED ORDER — FENTANYL CITRATE (PF) 250 MCG/5ML IJ SOLN
INTRAMUSCULAR | Status: DC | PRN
  Administered 2018-09-04 (×2): 50 ug via INTRAVENOUS

## 2018-09-04 MED ORDER — PHENYLEPHRINE 40 MCG/ML (10ML) SYRINGE FOR IV PUSH (FOR BLOOD PRESSURE SUPPORT)
PREFILLED_SYRINGE | INTRAVENOUS | Status: DC | PRN
  Administered 2018-09-04 (×2): 120 ug via INTRAVENOUS

## 2018-09-04 MED ORDER — SODIUM CHLORIDE 0.9 % IV SOLN
INTRAVENOUS | Status: DC | PRN
  Administered 2018-09-04 (×3): via INTRAVENOUS

## 2018-09-04 MED ORDER — HYDROMORPHONE HCL 1 MG/ML IJ SOLN
0.5000 mg | INTRAMUSCULAR | Status: DC | PRN
  Administered 2018-09-04 (×2): 1 mg via INTRAVENOUS
  Filled 2018-09-04 (×2): qty 1

## 2018-09-04 MED ORDER — METOCLOPRAMIDE HCL 5 MG PO TABS: 5.0000 mg | ORAL_TABLET | Freq: Three times a day (TID) | ORAL | Status: DC | PRN

## 2018-09-04 MED ORDER — DEXAMETHASONE SODIUM PHOSPHATE 10 MG/ML IJ SOLN
INTRAMUSCULAR | Status: AC
  Filled 2018-09-04: qty 1

## 2018-09-04 MED ORDER — MIDAZOLAM HCL 2 MG/2ML IJ SOLN
INTRAMUSCULAR | Status: AC
  Filled 2018-09-04: qty 2

## 2018-09-04 MED ORDER — CEFAZOLIN SODIUM-DEXTROSE 2-4 GM/100ML-% IV SOLN
2.0000 g | INTRAVENOUS | Status: AC
  Administered 2018-09-04: 2 g via INTRAVENOUS
  Filled 2018-09-04 (×2): qty 100

## 2018-09-04 MED ORDER — METOCLOPRAMIDE HCL 5 MG/ML IJ SOLN: 5.0000 mg | Freq: Three times a day (TID) | INTRAMUSCULAR | Status: DC | PRN

## 2018-09-04 MED ORDER — BISACODYL 10 MG RE SUPP: 10.0000 mg | Freq: Every day | RECTAL | Status: DC | PRN

## 2018-09-04 MED ORDER — LIDOCAINE HCL (CARDIAC) PF 100 MG/5ML IV SOSY
PREFILLED_SYRINGE | INTRAVENOUS | Status: DC | PRN
  Administered 2018-09-04: 100 mg via INTRAVENOUS

## 2018-09-04 MED ORDER — MUPIROCIN 2 % EX OINT
TOPICAL_OINTMENT | CUTANEOUS | Status: AC
  Administered 2018-09-04: 11:00:00
  Filled 2018-09-04: qty 22

## 2018-09-04 MED ORDER — ONDANSETRON HCL 4 MG PO TABS: 4.0000 mg | ORAL_TABLET | Freq: Four times a day (QID) | ORAL | Status: DC | PRN

## 2018-09-04 MED ORDER — ONDANSETRON HCL 4 MG/2ML IJ SOLN: 4.0000 mg | Freq: Once | INTRAMUSCULAR | Status: DC | PRN

## 2018-09-04 MED ORDER — OXYCODONE HCL 5 MG PO TABS: 10.0000 mg | ORAL_TABLET | ORAL | Status: DC | PRN

## 2018-09-04 MED ORDER — PROPOFOL 10 MG/ML IV BOLUS
INTRAVENOUS | Status: DC | PRN
  Administered 2018-09-04: 200 mg via INTRAVENOUS

## 2018-09-04 MED ORDER — POLYETHYLENE GLYCOL 3350 17 G PO PACK: 17.0000 g | PACK | Freq: Every day | ORAL | Status: DC | PRN

## 2018-09-04 MED ORDER — ONDANSETRON HCL 4 MG/2ML IJ SOLN
INTRAMUSCULAR | Status: DC | PRN
  Administered 2018-09-04: 4 mg via INTRAVENOUS

## 2018-09-04 MED ORDER — MAGNESIUM CITRATE PO SOLN: 1.0000 | Freq: Once | ORAL | Status: DC | PRN

## 2018-09-04 MED ORDER — 0.9 % SODIUM CHLORIDE (POUR BTL) OPTIME
TOPICAL | Status: DC | PRN
  Administered 2018-09-04: 14:00:00 1000 mL

## 2018-09-04 SURGICAL SUPPLY — 36 items
BLADE SAW RECIP 87.9 MT (BLADE) ×2 IMPLANT
BLADE SURG 21 STRL SS (BLADE) ×2 IMPLANT
BNDG COHESIVE 6X5 TAN STRL LF (GAUZE/BANDAGES/DRESSINGS) ×1 IMPLANT
CANISTER WOUND CARE 500ML ATS (WOUND CARE) ×2 IMPLANT
COVER SURGICAL LIGHT HANDLE (MISCELLANEOUS) ×1 IMPLANT
COVER WAND RF STERILE (DRAPES) IMPLANT
CUFF TOURNIQUET SINGLE 34IN LL (TOURNIQUET CUFF) ×2 IMPLANT
DRAPE INCISE IOBAN 66X45 STRL (DRAPES) ×2 IMPLANT
DRAPE U-SHAPE 47X51 STRL (DRAPES) ×2 IMPLANT
DRESSING PREVENA PLUS CUSTOM (GAUZE/BANDAGES/DRESSINGS) ×1 IMPLANT
DRSG PREVENA PLUS CUSTOM (GAUZE/BANDAGES/DRESSINGS) ×2
DURAPREP 26ML APPLICATOR (WOUND CARE) ×2 IMPLANT
ELECT REM PT RETURN 9FT ADLT (ELECTROSURGICAL) ×2
ELECTRODE REM PT RTRN 9FT ADLT (ELECTROSURGICAL) ×1 IMPLANT
GLOVE BIOGEL PI IND STRL 9 (GLOVE) ×1 IMPLANT
GLOVE BIOGEL PI INDICATOR 9 (GLOVE) ×1
GLOVE SURG ORTHO 9.0 STRL STRW (GLOVE) ×2 IMPLANT
GOWN STRL REUS W/ TWL XL LVL3 (GOWN DISPOSABLE) ×2 IMPLANT
GOWN STRL REUS W/TWL XL LVL3 (GOWN DISPOSABLE) ×4
KIT BASIN OR (CUSTOM PROCEDURE TRAY) ×2 IMPLANT
KIT TURNOVER KIT B (KITS) ×2 IMPLANT
MANIFOLD NEPTUNE II (INSTRUMENTS) ×1 IMPLANT
NS IRRIG 1000ML POUR BTL (IV SOLUTION) ×2 IMPLANT
PACK ORTHO EXTREMITY (CUSTOM PROCEDURE TRAY) ×2 IMPLANT
PAD ARMBOARD 7.5X6 YLW CONV (MISCELLANEOUS) ×2 IMPLANT
PREVENA RESTOR ARTHOFORM 46X30 (CANNISTER) ×2 IMPLANT
SPONGE LAP 18X18 RF (DISPOSABLE) IMPLANT
STAPLER VISISTAT 35W (STAPLE) IMPLANT
STOCKINETTE IMPERVIOUS LG (DRAPES) ×2 IMPLANT
SUT ETHILON 2 0 PSLX (SUTURE) IMPLANT
SUT SILK 2 0 (SUTURE) ×2
SUT SILK 2-0 18XBRD TIE 12 (SUTURE) ×1 IMPLANT
SUT VIC AB 1 CTX 27 (SUTURE) ×4 IMPLANT
TOWEL OR 17X26 10 PK STRL BLUE (TOWEL DISPOSABLE) ×2 IMPLANT
TUBE CONNECTING 12X1/4 (SUCTIONS) ×2 IMPLANT
YANKAUER SUCT BULB TIP NO VENT (SUCTIONS) ×2 IMPLANT

## 2018-09-04 NOTE — Progress Notes (Signed)
Pt arrived from PACU, he is alert and orientedx4. IV was SL as he had LR infusing. Pt c/o pain 10/10 in the right leg-checked orders and will admin pain medicine per order. Bed in lowest position and call light within reach. VSS, respirations even and unlabored (on RA). No other concerns at this time.  Paulla Fore, RN

## 2018-09-04 NOTE — Anesthesia Preprocedure Evaluation (Signed)
Anesthesia Evaluation  Patient identified by MRN, date of birth, ID band Patient awake    Reviewed: Allergy & Precautions, NPO status , Patient's Chart, lab work & pertinent test results  Airway Mallampati: II  TM Distance: >3 FB Neck ROM: Full    Dental  (+) Teeth Intact, Missing,    Pulmonary    breath sounds clear to auscultation       Cardiovascular  Rhythm:Regular Rate:Normal     Neuro/Psych    GI/Hepatic   Endo/Other  diabetes  Renal/GU      Musculoskeletal   Abdominal   Peds  Hematology   Anesthesia Other Findings   Reproductive/Obstetrics                             Anesthesia Physical Anesthesia Plan  ASA: III  Anesthesia Plan: General   Post-op Pain Management:    Induction: Intravenous  PONV Risk Score and Plan: Ondansetron  Airway Management Planned: LMA  Additional Equipment:   Intra-op Plan:   Post-operative Plan:   Informed Consent: I have reviewed the patients History and Physical, chart, labs and discussed the procedure including the risks, benefits and alternatives for the proposed anesthesia with the patient or authorized representative who has indicated his/her understanding and acceptance.     Dental advisory given  Plan Discussed with: CRNA and Anesthesiologist  Anesthesia Plan Comments:         Anesthesia Quick Evaluation

## 2018-09-04 NOTE — Transfer of Care (Signed)
Immediate Anesthesia Transfer of Care Note  Patient: Alvin Daniels  Procedure(s) Performed: RIGHT BELOW KNEE AMPUTATION (Right )  Patient Location: PACU  Anesthesia Type:General  Level of Consciousness: awake, alert  and oriented  Airway & Oxygen Therapy: Patient Spontanous Breathing and Patient connected to face mask oxygen  Post-op Assessment: Report given to RN and Post -op Vital signs reviewed and stable  Post vital signs: Reviewed and stable  Last Vitals:  Vitals Value Taken Time  BP 106/66 09/04/2018  2:09 PM  Temp    Pulse 76 09/04/2018  2:18 PM  Resp 26 09/04/2018  2:18 PM  SpO2 100 % 09/04/2018  2:18 PM  Vitals shown include unvalidated device data.  Last Pain:  Vitals:   09/04/18 1118  TempSrc:   PainSc: 5       Patients Stated Pain Goal: 2 (81/15/72 6203)  Complications: No apparent anesthesia complications

## 2018-09-04 NOTE — Anesthesia Postprocedure Evaluation (Signed)
Anesthesia Post Note  Patient: Alvin Daniels  Procedure(s) Performed: RIGHT BELOW KNEE AMPUTATION (Right )     Patient location during evaluation: PACU Anesthesia Type: General Level of consciousness: awake Pain management: pain level controlled Vital Signs Assessment: post-procedure vital signs reviewed and stable Cardiovascular status: stable Postop Assessment: no apparent nausea or vomiting Anesthetic complications: no    Last Vitals:  Vitals:   09/04/18 1500 09/04/18 1506  BP:  107/66  Pulse:  84  Resp:    Temp: 36.6 C   SpO2:  100%    Last Pain:  Vitals:   09/04/18 1500  TempSrc: Oral  PainSc:                  [name]

## 2018-09-04 NOTE — Progress Notes (Signed)
[facility]  Date of Admission:  08/29/2018      Total days of antibiotics 6  Day 6 Vancomycin     Patient ID: Alvin Daniels is a 48 y.o. male with  Principal Problem:   MRSA bacteremia Active Problems:   Abscess of ankle   ESRD on dialysis ([place])   HIV disease ([place])   Diabetes mellitus with ESRD (end-stage renal disease) ([place])   Chronic diarrhea   Charcot foot due to diabetes mellitus ([place])   Abscess of foot   . bictegravir-emtricitabine-tenofovir AF  1 tablet Oral Daily  . Chlorhexidine Gluconate Cloth  6 each Topical Q0600  . darbepoetin (ARANESP) injection - DIALYSIS  200 mcg Intravenous Q Tue-HD  . docusate sodium  100 mg Oral BID  . feeding supplement (PRO-STAT SUGAR FREE 64)  30 mL Oral BID  . heparin  5,000 Units Subcutaneous Q8H  . insulin aspart  0-5 Units Subcutaneous QHS  . insulin aspart  0-9 Units Subcutaneous TID WC  . insulin glargine  5 Units Subcutaneous BID  . lidocaine (PF)  10 mL Infiltration Once  . midodrine  10 mg Oral Once per day on Tue Thu Sat  . multivitamin  1 tablet Oral QHS  . vancomycin  500 mg Intravenous Q T,Th,Sa-HD    SUBJECTIVE: Has not talked with Dr. Sharol Given yet - still thinking about context of what we discussed yesterday. Nothing to update.   Afebrile over night and swelling continues to improve in foot.   Review of Systems: Review of Systems  Constitutional: Negative for chills and fever.  Respiratory: Negative for cough and shortness of breath.   Gastrointestinal: Negative for abdominal pain, diarrhea and vomiting.  Genitourinary:       Anuric  Musculoskeletal: Positive for joint pain.  Skin: Positive for itching.  Neurological: Negative for focal weakness, weakness and headaches.    No Known Allergies  OBJECTIVE: Vitals:   09/04/18 1440 09/04/18 1500 09/04/18 1506 09/04/18 1643  BP: 103/65  107/66 128/78  Pulse: 77  84 86  Resp: 15   18  Temp: (!) 97.5 F (36.4 C)  97.8 F (36.6 C)    TempSrc:  Oral    SpO2: 100%  100% 100%  Weight:       Body mass index is 21.23 kg/m.  Physical Exam Constitutional:      Appearance: Normal appearance.     Comments: Resting comfortably in the bed sitting upright.  HENT:     Mouth/Throat:     Mouth: Mucous membranes are moist.     Pharynx: Oropharynx is clear. No oropharyngeal exudate.  Eyes:     General: No scleral icterus.    Conjunctiva/sclera: Conjunctivae normal.  Cardiovascular:     Rate and Rhythm: Normal rate and regular rhythm.     Heart sounds: No murmur.  Pulmonary:     Effort: Pulmonary effort is normal.     Breath sounds: Normal breath sounds. No rales.  Abdominal:     General: Bowel sounds are normal. There is no distension.  Musculoskeletal:     Comments: R BKA dressed w/o drainage   Skin:    General: Skin is warm and dry.  Neurological:     Mental Status: He is alert.     Lab Results Lab Results  Component Value Date   WBC 7.7 09/03/2018   HGB 8.8 (L) 09/03/2018   HCT 28.0 (L) 09/03/2018   MCV 94.0  09/03/2018   PLT 260 09/03/2018    Lab Results  Component Value Date   CREATININE 5.30 (H) 09/04/2018   BUN 19 09/04/2018   NA 136 09/04/2018   K 3.9 09/04/2018   CL 96 (L) 09/04/2018   CO2 27 09/04/2018    Lab Results  Component Value Date   ALT 9 08/29/2018   AST 18 08/29/2018   ALKPHOS 80 08/29/2018   BILITOT 1.0 08/29/2018     Microbiology: Recent Results (from the past 240 hour(s))  Blood culture (routine x 2)     Status: Abnormal   Collection Time: 08/29/18 10:10 PM  Result Value Ref Range Status   Specimen Description BLOOD RIGHT WRIST  Final   Special Requests   Final    BOTTLES DRAWN AEROBIC ONLY Blood Culture results may not be optimal due to an inadequate volume of blood received in culture bottles   Culture  Setup Time   Final    GRAM POSITIVE COCCI IN CLUSTERS AEROBIC BOTTLE ONLY CRITICAL RESULT CALLED TO, READ BACK BY AND VERIFIED WITH: PHARMD  PIERCE, D 1723 [id number] FCP    Culture (A)  Final    STAPHYLOCOCCUS AUREUS SUSCEPTIBILITIES PERFORMED ON PREVIOUS CULTURE WITHIN THE LAST 5 DAYS. Performed at [facility], [place] [address], [place], [place] [address]    Report Status 09/01/2018 FINAL  Final  Blood Culture ID Panel (Reflexed)     Status: Abnormal   Collection Time: 08/29/18 10:10 PM  Result Value Ref Range Status   Enterococcus species NOT DETECTED NOT DETECTED Final   Listeria monocytogenes NOT DETECTED NOT DETECTED Final   Staphylococcus species DETECTED (A) NOT DETECTED Final    Comment: CRITICAL RESULT CALLED TO, READ BACK BY AND VERIFIED WITH: PHARMD PIERCE, D 1723 [id number] FCP    Staphylococcus aureus (BCID) DETECTED (A) NOT DETECTED Final    Comment: Methicillin (oxacillin)-resistant Staphylococcus aureus (MRSA). MRSA is predictably resistant to beta-lactam antibiotics (except ceftaroline). Preferred therapy is vancomycin unless clinically contraindicated. Patient requires contact precautions if  hospitalized. CRITICAL RESULT CALLED TO, READ BACK BY AND VERIFIED WITH: PHARMD PIERCE, D 1723 [id number] FCP    Methicillin resistance DETECTED (A) NOT DETECTED Final    Comment: CRITICAL RESULT CALLED TO, READ BACK BY AND VERIFIED WITH: PHARMD PIERCE, D 1723 [id number] FCP    Streptococcus species NOT DETECTED NOT DETECTED Final   Streptococcus agalactiae NOT DETECTED NOT DETECTED Final   Streptococcus pneumoniae NOT DETECTED NOT DETECTED Final   Streptococcus pyogenes NOT DETECTED NOT DETECTED Final   Acinetobacter baumannii NOT DETECTED NOT DETECTED Final   Enterobacteriaceae species NOT DETECTED NOT DETECTED Final   Enterobacter cloacae complex NOT DETECTED NOT DETECTED Final   Escherichia coli NOT DETECTED NOT DETECTED Final   Klebsiella oxytoca NOT DETECTED NOT DETECTED Final   Klebsiella pneumoniae NOT DETECTED NOT DETECTED Final   Proteus species NOT DETECTED NOT DETECTED Final   Serratia marcescens NOT DETECTED  NOT DETECTED Final   Haemophilus influenzae NOT DETECTED NOT DETECTED Final   Neisseria meningitidis NOT DETECTED NOT DETECTED Final   Pseudomonas aeruginosa NOT DETECTED NOT DETECTED Final   Candida albicans NOT DETECTED NOT DETECTED Final   Candida glabrata NOT DETECTED NOT DETECTED Final   Candida krusei NOT DETECTED NOT DETECTED Final   Candida parapsilosis NOT DETECTED NOT DETECTED Final   Candida tropicalis NOT DETECTED NOT DETECTED Final    Comment: Performed at [facility], [place]. [address] [place], [place] [address]  Blood culture (routine  x 2)     Status: Abnormal   Collection Time: 08/29/18 10:16 PM  Result Value Ref Range Status   Specimen Description BLOOD RIGHT HAND  Final   Special Requests   Final    BOTTLES DRAWN AEROBIC ONLY Blood Culture results may not be optimal due to an inadequate volume of blood received in culture bottles   Culture  Setup Time   Final    GRAM POSITIVE COCCI AEROBIC BOTTLE ONLY CRITICAL RESULT CALLED TO, READ BACK BY AND VERIFIED WITH: Marland Mcalpine [id number] MLM Performed at [facility], [place] [address] [place], [place] [address]    Culture METHICILLIN RESISTANT STAPHYLOCOCCUS AUREUS (A)  Final   Report Status 09/01/2018 FINAL  Final   Organism ID, Bacteria METHICILLIN RESISTANT STAPHYLOCOCCUS AUREUS  Final      Susceptibility   Methicillin resistant staphylococcus aureus - MIC*    CIPROFLOXACIN <=0.5 SENSITIVE Sensitive     ERYTHROMYCIN >=8 RESISTANT Resistant     GENTAMICIN <=0.5 SENSITIVE Sensitive     OXACILLIN >=4 RESISTANT Resistant     TETRACYCLINE >=16 RESISTANT Resistant     VANCOMYCIN 1 SENSITIVE Sensitive     TRIMETH/SULFA <=10 SENSITIVE Sensitive     CLINDAMYCIN <=0.25 SENSITIVE Sensitive     RIFAMPIN <=0.5 SENSITIVE Sensitive     Inducible Clindamycin NEGATIVE Sensitive     * METHICILLIN RESISTANT STAPHYLOCOCCUS AUREUS  Blood Culture ID Panel (Reflexed)     Status: Abnormal   Collection Time: 08/29/18 10:16  PM  Result Value Ref Range Status   Enterococcus species NOT DETECTED NOT DETECTED Final   Listeria monocytogenes NOT DETECTED NOT DETECTED Final   Staphylococcus species DETECTED (A) NOT DETECTED Final    Comment: CRITICAL RESULT CALLED TO, READ BACK BY AND VERIFIED WITH: PHARMD J Barclay [id number] MLM    Staphylococcus aureus (BCID) DETECTED (A) NOT DETECTED Final    Comment: Methicillin (oxacillin)-resistant Staphylococcus aureus (MRSA). MRSA is predictably resistant to beta-lactam antibiotics (except ceftaroline). Preferred therapy is vancomycin unless clinically contraindicated. Patient requires contact precautions if  hospitalized. CRITICAL RESULT CALLED TO, READ BACK BY AND VERIFIED WITH: PHARMD J Orwell [id number] MLM    Methicillin resistance DETECTED (A) NOT DETECTED Final    Comment: CRITICAL RESULT CALLED TO, READ BACK BY AND VERIFIED WITH: PHARMD J  [id number] MLM    Streptococcus species NOT DETECTED NOT DETECTED Final   Streptococcus agalactiae NOT DETECTED NOT DETECTED Final   Streptococcus pneumoniae NOT DETECTED NOT DETECTED Final   Streptococcus pyogenes NOT DETECTED NOT DETECTED Final   Acinetobacter baumannii NOT DETECTED NOT DETECTED Final   Enterobacteriaceae species NOT DETECTED NOT DETECTED Final   Enterobacter cloacae complex NOT DETECTED NOT DETECTED Final   Escherichia coli NOT DETECTED NOT DETECTED Final   Klebsiella oxytoca NOT DETECTED NOT DETECTED Final   Klebsiella pneumoniae NOT DETECTED NOT DETECTED Final   Proteus species NOT DETECTED NOT DETECTED Final   Serratia marcescens NOT DETECTED NOT DETECTED Final   Haemophilus influenzae NOT DETECTED NOT DETECTED Final   Neisseria meningitidis NOT DETECTED NOT DETECTED Final   Pseudomonas aeruginosa NOT DETECTED NOT DETECTED Final   Candida albicans NOT DETECTED NOT DETECTED Final   Candida glabrata NOT DETECTED NOT DETECTED Final   Candida krusei NOT DETECTED NOT DETECTED Final   Candida  parapsilosis NOT DETECTED NOT DETECTED Final   Candida tropicalis NOT DETECTED NOT DETECTED Final    Comment: Performed at [facility], [address],  [place], [place] [address]  Wound or Superficial Culture     Status: None   Collection Time: 08/30/18 12:37 AM  Result Value Ref Range Status   Specimen Description ABSCESS  Final   Special Requests RIGHT FOOT  Final   Gram Stain   Final    FEW WBC PRESENT, PREDOMINANTLY PMN NO ORGANISMS SEEN Performed at [facility], [place] [address] [place], [place] [address]    Culture FEW METHICILLIN RESISTANT STAPHYLOCOCCUS AUREUS  Final   Report Status 09/01/2018 FINAL  Final   Organism ID, Bacteria METHICILLIN RESISTANT STAPHYLOCOCCUS AUREUS  Final      Susceptibility   Methicillin resistant staphylococcus aureus - MIC*    CIPROFLOXACIN <=0.5 SENSITIVE Sensitive     ERYTHROMYCIN >=8 RESISTANT Resistant     GENTAMICIN <=0.5 SENSITIVE Sensitive     OXACILLIN >=4 RESISTANT Resistant     TETRACYCLINE >=16 RESISTANT Resistant     VANCOMYCIN 1 SENSITIVE Sensitive     TRIMETH/SULFA <=10 SENSITIVE Sensitive     CLINDAMYCIN <=0.25 SENSITIVE Sensitive     RIFAMPIN <=0.5 SENSITIVE Sensitive     Inducible Clindamycin NEGATIVE Sensitive     * FEW METHICILLIN RESISTANT STAPHYLOCOCCUS AUREUS  Culture, blood (routine x 2)     Status: None (Preliminary result)   Collection Time: 08/31/18  1:30 PM  Result Value Ref Range Status   Specimen Description BLOOD RIGHT ARM  Final   Special Requests AEROBIC BOTTLE ONLY Blood Culture adequate volume  Final   Culture   Final    NO GROWTH 4 DAYS Performed at [facility], [place] [address], [place], [place] [address]    Report Status PENDING  Incomplete  Culture, blood (routine x 2)     Status: None (Preliminary result)   Collection Time: 08/31/18  2:00 PM  Result Value Ref Range Status   Specimen Description BLOOD RIGHT ANTECUBITAL  Final   Special Requests AEROBIC BOTTLE ONLY Blood Culture  adequate volume  Final   Culture   Final    NO GROWTH 4 DAYS Performed at [facility], [place] [address], [place], [place] [address]    Report Status PENDING  Incomplete  Surgical pcr screen     Status: None   Collection Time: 09/04/18  8:59 AM  Result Value Ref Range Status   MRSA, PCR NEGATIVE NEGATIVE Final   Staphylococcus aureus NEGATIVE NEGATIVE Final    Comment: (NOTE) The Xpert SA Assay (FDA approved for NASAL specimens in patients 65 years of age and older), is one component of a comprehensive surveillance program. It is not intended to diagnose infection nor to guide or monitor treatment. Performed at [facility], [place] [address], [place], [place] [address]       ASSESSMENT & PLAN:   1. MRSA bacteremia, recurrent = relapsed bacteremia following prolonged 6-week course of therapy less than a month ago.  TTE negative for valvular dysfunction/significant regurgitation and now with definitive surgery with R BKA. Would treat 2 weeks from today with vancomycin after HD sessions.   2. 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 He was taken for R BKA today.    3. Diabetes mellitus = HgbA1c 9% in February 2020.   4. End-stage renal disease on dialysis =  on dialysis for 4 years now.    Janene Madeira, MSN, NP-C [name] Pager: [phone]  09/04/2018  7:52 PM

## 2018-09-04 NOTE — Progress Notes (Addendum)
[facility] Progress Note   Subjective:  Seen in room. No CP/dyspnea. Plan is for L BKA today. He has several questions regarding the surgery itself, specifically how short the stump will be - told to ask his surgery team.  Objective Vitals:   09/03/18 1737 09/03/18 2018 09/04/18 0507 09/04/18 0908  BP: 119/69 113/72 (!) 109/58 111/69  Pulse: 97 95 97 93  Resp:  18 17 18   Temp: 98.7 F (37.1 C) 99.7 F (37.6 C) 99.1 F (37.3 C) 98.6 F (37 C)  TempSrc: Oral Oral Oral Oral  SpO2: 98% 100% 98% 100%  Weight:  49.3 kg     Physical Exam General: Well appearing man, NAD Heart: RRR; no murmur Lungs: CTAB Abdomen: soft, non-tender Extremities: L BKA without stump edema; R ankle with wound which is bandaged. Dialysis Access: LUE AVF + thrill  Additional Objective Labs: Basic Metabolic Panel: Recent Labs  Lab 09/01/18 0434 09/03/18 0543 09/04/18 0713  NA 135 133* 136  K 4.0 3.7 3.9  CL 98 95* 96*  CO2 21* 25 27  GLUCOSE 125* 220* 146*  BUN 67* 43* 19  CREATININE 11.70* 8.13* 5.30*  CALCIUM 9.3 8.6* 8.6*   Liver Function Tests: Recent Labs  Lab 08/29/18 2202  AST 18  ALT 9  ALKPHOS 80  BILITOT 1.0  PROT 7.6  ALBUMIN 2.3*   CBC: Recent Labs  Lab 08/29/18 2202 08/30/18 0457 08/31/18 0605 09/01/18 0434 09/03/18 0543  WBC 12.0* 10.3 9.2 10.9* 7.7  NEUTROABS 11.2*  --   --   --  5.1  HGB 8.7* 7.5* 7.8* 7.3* 8.8*  HCT 28.8* 24.8* 25.0* 24.0* 28.0*  MCV 95.4 95.0 94.7 93.8 94.0  PLT 240 199 204 205 260   Blood Culture    Component Value Date/Time   SDES BLOOD RIGHT ANTECUBITAL 08/31/2018 1400   SPECREQUEST AEROBIC BOTTLE ONLY Blood Culture adequate volume 08/31/2018 1400   CULT  08/31/2018 1400    NO GROWTH 4 DAYS Performed at [facility], [place] [address], [place], [place] [address]    REPTSTATUS PENDING 08/31/2018 1400   Studies/Results: Vas [place] Abi With/wo Tbi  Result Date: 09/02/2018 LOWER EXTREMITY DOPPLER STUDY Indications:  Ulceration.  Performing Technologist: Abram Sander RVS  Examination Guidelines: A complete evaluation includes at minimum, Doppler waveform signals and systolic blood pressure reading at the level of bilateral brachial, anterior tibial, and posterior tibial arteries, when vessel segments are accessible. Bilateral testing is considered an integral part of a complete examination. Photoelectric Plethysmograph (PPG) waveforms and toe systolic pressure readings are included as required and additional duplex testing as needed. Limited examinations for reoccurring indications may be performed as noted.  ABI Findings: +--------+------------------+-----+----------+--------+ Right   Rt Pressure (mmHg)IndexWaveform  Comment  +--------+------------------+-----+----------+--------+ [id number]                    triphasic          +--------+------------------+-----+----------+--------+ PTA     171               1.49 monophasic         +--------+------------------+-----+----------+--------+ DP      158               1.37 triphasic          +--------+------------------+-----+----------+--------+ +--------+------------------+-----+--------+-----------------------------------+ Left    Lt Pressure (mmHg)IndexWaveformComment                             +--------+------------------+-----+--------+-----------------------------------+  Brachial                               unable to obtain due to upper                                              extremity restriction               +--------+------------------+-----+--------+-----------------------------------+ PTA                                    unable to obtain due to aka         +--------+------------------+-----+--------+-----------------------------------+ DP                                     unable to obtain due to aka         +--------+------------------+-----+--------+-----------------------------------+  +-------+-----------+-----------+------------+------------+ ABI/TBIToday's ABIToday's TBIPrevious ABIPrevious TBI +-------+-----------+-----------+------------+------------+ Right  1.49                                           +-------+-----------+-----------+------------+------------+  Summary: Right: Resting right ankle-brachial index indicates noncompressible right lower extremity arteries.  *See table(s) above for measurements and observations.  Electronically signed by Curt Jews MD on 09/02/2018 at 4:01:57 PM.   Final    Medications: .  ceFAZolin (ANCEF) IV     . bictegravir-emtricitabine-tenofovir AF  1 tablet Oral Daily  . chlorhexidine  60 mL Topical Once  . Chlorhexidine Gluconate Cloth  6 each Topical Q0600  . Chlorhexidine Gluconate Cloth  6 each Topical Q0600  . darbepoetin (ARANESP) injection - DIALYSIS  200 mcg Intravenous Q Tue-HD  . feeding supplement (PRO-STAT SUGAR FREE 64)  30 mL Oral BID  . heparin  5,000 Units Subcutaneous Q8H  . insulin aspart  0-5 Units Subcutaneous QHS  . insulin aspart  0-9 Units Subcutaneous TID WC  . insulin glargine  5 Units Subcutaneous BID  . lidocaine (PF)  10 mL Infiltration Once  . midodrine  10 mg Oral Once per day on Tue Thu Sat  . multivitamin  1 tablet Oral QHS  . vancomycin  500 mg Intravenous Q T,Th,Sa-HD    Dialysis Orders: TTS at [facility] 4hr, 400/A1.5, EDW 52kg, 2K/2Ca, Linear Na, AVF, heparin 2000 bolus - Hectoral 16mcg IV q HD - Just finished course of Venofer - Mircera 126mcg IV q 2 weeks (last given 4/7)  Assessment/Plan: 1.Right ankle abscess/osteomyelitis: History of osteomyelitis in 06/2018 - treated with 6 week abx, now with recurrent osteomyelitis. Blood Cx + MRSA - on Vancomycin. Planned for amputation 4/24. 2. ESRD:TTS schedule - next HD tomorrow (4/25). 3.Hypotension/Volume: No evidence of volume overload on exam. BP chronically low during HD. Midodrine pre-HD on outpatient med list but has not been  taking as he is unable to afford it. 4. Anemia:Hgb 8.8. Now on Aranesp 238mcg q Tues. 5. Secondary hyperparathyroidism:CorrCa high - VDRA on hold. Phos pending. 6. Nutrition:Albumin 2.3 - continue pro-stat supplements. 7. T2DM:Per primary. 8. HIV:Per primary.  Veneta Penton, PA-C 09/04/2018, 9:27 AM  [facility] Pager: [phone])  [phone]  I have seen and examined this patient and agree with plan and assessment in the above note with renal recommendations/intervention highlighted.  Cont with hd q TTS. Broadus John A Tacuma Graffam,MD 09/04/2018 1:46 PM

## 2018-09-04 NOTE — Interval H&P Note (Signed)
History and Physical Interval Note:  09/04/2018 6:49 AM  Alvin Daniels  has presented today for surgery, with the diagnosis of Abscess, Osteomyelitis Right Ankle.  The various methods of treatment have been discussed with the patient and family. After consideration of risks, benefits and other options for treatment, the patient has consented to  Procedure(s): RIGHT BELOW KNEE AMPUTATION (Right) as a surgical intervention.  The patient's history has been reviewed, patient examined, no change in status, stable for surgery.  I have reviewed the patient's chart and labs.  Questions were answered to the patient's satisfaction.     Newt Minion

## 2018-09-04 NOTE — Progress Notes (Signed)
Interpreting I-pad used to communicate with the pt. Pt is spanish speaking.

## 2018-09-04 NOTE — Progress Notes (Signed)
[facility] Progress Note  Patient: Alvin Daniels NGE:[id number]   PCP: [name], MD DOB: June 12, 1970   DOA: 08/29/2018   DOS: 09/04/2018   Date of Service: the patient was seen and examined on 09/04/2018  Brief hospital course: Pt. with PMH of ESRD on HD MWF, type II DM, HIV, HCV, left BKA, recent MRSA bacteremia; admitted on 08/29/2018, presented with complaint of abscess, was found to have MRSA bacteremia with osteomyelitis. Currently further plan is continue IV antibiotics, monitor Ortho recommendation.  Subjective: No acute events overnight.  Unable to examine the patient as patient was in OR.  Assessment and Plan: 1.  MRSA bacteremia Right ankle abscess with Charcot's joint. Osteomyelitis of right foot. Presents with drainage from right foot ulcer, bedside I&D was performed in the ER.  Cultures were sent out which grew MRSA. Blood cultures were performed which also grew MRSA on admission. Repeat cultures so far negative. ID consulted. Echocardiogram-TTE negative for any significant vegetation or regurgitation.  Not sure whether patient requires a TEE as he already has osteomyelitis. Will defer to ID. Started on IV vancomycin with HD. Initial CT scan was suggestive of no evidence of septic joint although an MRI is positive for osteomyelitis of distal fibula, talus, calcaneus, cuboid which is new since his last MRI 2 months ago. Orthopedic Dr. Sharol Given consulted, [place] scheduled today. Resting right ABI indicates noncompressible arteries.  2. ESRD on HD. Anemia of chronic kidney disease Appreciate nephrology assistance. Hemodialysis on TTS. Hemoglobin currently stable. Management of anemia per nephrology.  3.  HIV. CD4 count dropped from 300-200. Current recommendation is to continue Biktarvy.  4. Chronic hypotension. Patient is on midodrine which I will continue. Blood pressure currently high 90s SBP. Asymptomatic. Monitor.  5. Type 2 Diabetes Mellitus,  uncontroled with renal and neuropathic complication last hemoglobin A1c was 9.0 in 06/18/2018. On insulin sliding scale sensitive with Basal insulin lantus.  6. Increased nutrient needs related to wound healing Body mass index is 21.23 kg/m.  Nutrition Problem: Increased nutrient needs Etiology: wound healing Interventions: Interventions: Prostat  Pressure Injury 08/21/16 (Active)  08/21/16 2020  Location: [place]  Location Orientation: Right  Staging:   Wound Description (Comments):   Present on Admission:      Diet: renal diet DVT Prophylaxis: subcutaneous Heparin  Advance goals of care discussion: full code  Family Communication: no family was present at bedside, at the time of interview.  Discussed with brother on the phone.  Disposition:  Discharge to be determined.  Consultants: Infectious disease, orthopedic Dr. Sharol Given, nephrology Procedures: ABI  Scheduled Meds: . bictegravir-emtricitabine-tenofovir AF  1 tablet Oral Daily  . Chlorhexidine Gluconate Cloth  6 each Topical Q0600  . darbepoetin (ARANESP) injection - DIALYSIS  200 mcg Intravenous Q Tue-HD  . docusate sodium  100 mg Oral BID  . feeding supplement (PRO-STAT SUGAR FREE 64)  30 mL Oral BID  . heparin  5,000 Units Subcutaneous Q8H  . insulin aspart  0-5 Units Subcutaneous QHS  . insulin aspart  0-9 Units Subcutaneous TID WC  . insulin glargine  5 Units Subcutaneous BID  . lidocaine (PF)  10 mL Infiltration Once  . midodrine  10 mg Oral Once per day on Tue Thu Sat  . multivitamin  1 tablet Oral QHS  . vancomycin  500 mg Intravenous Q T,Th,Sa-HD   Continuous Infusions: . sodium chloride    . methocarbamol (ROBAXIN) IV     PRN Meds: [START ON 09/05/2018] acetaminophen, bisacodyl, HYDROcodone-acetaminophen, HYDROmorphone (DILAUDID) injection, methocarbamol **  OR** methocarbamol (ROBAXIN) IV, metoCLOPramide **OR** metoCLOPramide (REGLAN) injection, ondansetron **OR** ondansetron (ZOFRAN) IV, oxyCODONE,  oxyCODONE, polyethylene glycol Antibiotics: Anti-infectives (From admission, onward)   Start     Dose/Rate Route Frequency Ordered Stop   09/04/18 0600  ceFAZolin (ANCEF) IVPB 2g/100 mL premix     2 g 200 mL/hr over 30 Minutes Intravenous To Short Stay 09/04/18 0250 09/04/18 1330   09/01/18 1200  vancomycin (VANCOCIN) IVPB 500 mg/100 ml premix     500 mg 100 mL/hr over 60 Minutes Intravenous Every T-Th-Sa (Hemodialysis) 08/30/18 0914 09/18/18 2359   08/30/18 1000  bictegravir-emtricitabine-tenofovir AF (BIKTARVY) 50-200-25 MG per tablet 1 tablet     1 tablet Oral Daily 08/29/18 2353     08/30/18 0600  cefTRIAXone (ROCEPHIN) 2 g in sodium chloride 0.9 % 100 mL IVPB  Status:  Discontinued     2 g 200 mL/hr over 30 Minutes Intravenous Every 24 hours 08/29/18 2358 08/30/18 1829   08/30/18 0600  metroNIDAZOLE (FLAGYL) IVPB 500 mg  Status:  Discontinued     500 mg 100 mL/hr over 60 Minutes Intravenous Every 8 hours 08/29/18 2358 08/30/18 1829   08/29/18 2315  vancomycin (VANCOCIN) 1,250 mg in sodium chloride 0.9 % 250 mL IVPB     1,250 mg 166.7 mL/hr over 90 Minutes Intravenous  Once 08/29/18 2303 08/30/18 0206   08/29/18 2315  ampicillin-sulbactam (UNASYN) 1.5 g in sodium chloride 0.9 % 100 mL IVPB  Status:  Discontinued     1.5 g 200 mL/hr over 30 Minutes Intravenous  Once 08/29/18 2303 08/29/18 2304   08/29/18 2315  piperacillin-tazobactam (ZOSYN) IVPB 2.25 g  Status:  Discontinued     2.25 g 100 mL/hr over 30 Minutes Intravenous  Once 08/29/18 2304 08/29/18 2307   08/29/18 2315  piperacillin-tazobactam (ZOSYN) IVPB 3.375 g     3.375 g 100 mL/hr over 30 Minutes Intravenous  Once 08/29/18 2307 08/30/18 0022       Objective: Physical Exam: Vitals:   09/04/18 1440 09/04/18 1500 09/04/18 1506 09/04/18 1643  BP: 103/65  107/66 128/78  Pulse: 77  84 86  Resp: 15   18  Temp: (!) 97.5 F (36.4 C) 97.8 F (36.6 C)    TempSrc:  Oral    SpO2: 100%  100% 100%  Weight:         Intake/Output Summary (Last 24 hours) at 09/04/2018 1856 Last data filed at 09/04/2018 1823 Gross per 24 hour  Intake 760 ml  Output 100 ml  Net 660 ml   Filed Weights   09/03/18 1215 09/03/18 1622 09/03/18 2018  Weight: 52 kg 50.1 kg 49.3 kg    Data Reviewed: CBC: Recent Labs  Lab 08/29/18 2202 08/30/18 0457 08/31/18 0605 09/01/18 0434 09/03/18 0543  WBC 12.0* 10.3 9.2 10.9* 7.7  NEUTROABS 11.2*  --   --   --  5.1  HGB 8.7* 7.5* 7.8* 7.3* 8.8*  HCT 28.8* 24.8* 25.0* 24.0* 28.0*  MCV 95.4 95.0 94.7 93.8 94.0  PLT 240 199 204 205 585   Basic Metabolic Panel: Recent Labs  Lab 08/30/18 0457 08/31/18 0605 09/01/18 0434 09/03/18 0543 09/04/18 0713  NA 136 134* 135 133* 136  K 3.7 4.0 4.0 3.7 3.9  CL 100 99 98 95* 96*  CO2 22 22 21* 25 27  GLUCOSE 199* 151* 125* 220* 146*  BUN 38* 54* 67* 43* 19  CREATININE 8.01* 9.89* 11.70* 8.13* 5.30*  CALCIUM 8.0* 8.8* 9.3 8.6* 8.6*    Liver  Function Tests: Recent Labs  Lab 08/29/18 2202  AST 18  ALT 9  ALKPHOS 80  BILITOT 1.0  PROT 7.6  ALBUMIN 2.3*   No results for input(s): LIPASE, AMYLASE in the last 168 hours. No results for input(s): AMMONIA in the last 168 hours. Coagulation Profile: No results for input(s): INR, PROTIME in the last 168 hours. Cardiac Enzymes: No results for input(s): CKTOTAL, CKMB, CKMBINDEX, TROPONINI in the last 168 hours. BNP (last 3 results) No results for input(s): PROBNP in the last 8760 hours. CBG: Recent Labs  Lab 09/04/18 1000 09/04/18 1137 09/04/18 1415 09/04/18 1507 09/04/18 1641  GLUCAP 123* 137* 140* 141* 138*   Studies: No results found.   Time spent: 15 minutes  Author: Berle Mull, MD [facility] 09/04/2018 6:56 PM  To reach On-call, see care teams to locate the attending and reach out to them via [email or web address]. If 7PM-7AM, please contact night-coverage If you still have difficulty reaching the attending provider, please page the [facility] (Director on Call)  for [facility] on amion for assistance.

## 2018-09-04 NOTE — Op Note (Signed)
   Date of Surgery: 09/04/2018  INDICATIONS: Alvin Daniels is a 48 y.o.-year-old male who has abscess and osteomyelitis right ankle.  PREOPERATIVE DIAGNOSIS: abscess osteomyelitis right ankle  POSTOPERATIVE DIAGNOSIS: Same.  PROCEDURE: Transtibial amputation Application of Prevena wound VAC  SURGEON: Sharol Given, M.D.  ANESTHESIA:  general  IV FLUIDS AND URINE: See anesthesia.  ESTIMATED BLOOD LOSS: 100 mL.  COMPLICATIONS: None.  DESCRIPTION OF PROCEDURE: The patient was brought to the operating room and underwent a general anesthetic. After adequate levels of anesthesia were obtained patient's lower extremity was prepped using DuraPrep draped into a sterile field. A timeout was called. The foot was draped out of the sterile field with impervious stockinette. A transverse incision was made 11 cm distal to the tibial tubercle. This curved proximally and a large posterior flap was created. The tibia was transected 1 cm proximal to the skin incision. The fibula was transected just proximal to the tibial incision. The tibia was beveled anteriorly. A large posterior flap was created. The sciatic nerve was pulled cut and allowed to retract. The vascular bundles were suture ligated with 2-0 silk. The deep and superficial fascial layers were closed using #1 Vicryl. The skin was closed using staples and 2-0 nylon. The wound was covered with a Prevena wound VAC. There was a good suction fit. Patient was extubated taken to the PACU in stable condition.   DISCHARGE PLANNING:  Antibiotic duration:24 hours  Weightbearing: NWB right  Pain medication: opoid pathway  Dressing care/ Wound JIR:CVELFYBO for 1 week  Discharge to: home when stable  Follow-up: In the office 1 week post operative.  Meridee Score, MD [name] 2:17 PM

## 2018-09-04 NOTE — Anesthesia Procedure Notes (Signed)
Procedure Name: LMA Insertion Date/Time: 09/04/2018 1:24 PM Performed by: Alain Marion, CRNA Pre-anesthesia Checklist: Patient identified, Emergency Drugs available, Suction available and Patient being monitored Patient Re-evaluated:Patient Re-evaluated prior to induction Oxygen Delivery Method: Circle System Utilized Preoxygenation: Pre-oxygenation with 100% oxygen Induction Type: IV induction LMA: LMA inserted LMA Size: 4.0 Number of attempts: 1 Airway Equipment and Method: Bite block Placement Confirmation: positive ETCO2 Tube secured with: Tape Dental Injury: Teeth and Oropharynx as per pre-operative assessment

## 2018-09-05 ENCOUNTER — Encounter (HOSPITAL_COMMUNITY): Payer: Self-pay | Admitting: Orthopedic Surgery

## 2018-09-05 DIAGNOSIS — B20 Human immunodeficiency virus [HIV] disease: Secondary | ICD-10-CM

## 2018-09-05 DIAGNOSIS — Z89511 Acquired absence of right leg below knee: Secondary | ICD-10-CM

## 2018-09-05 DIAGNOSIS — Z978 Presence of other specified devices: Secondary | ICD-10-CM

## 2018-09-05 LAB — CBC WITH DIFFERENTIAL/PLATELET
Abs Immature Granulocytes: 0.14 10*3/uL — ABNORMAL HIGH (ref 0.00–0.07)
Basophils Absolute: 0 10*3/uL (ref 0.0–0.1)
Basophils Relative: 1 %
Eosinophils Absolute: 0.1 10*3/uL (ref 0.0–0.5)
Eosinophils Relative: 3 %
HCT: 27.7 % — ABNORMAL LOW (ref 39.0–52.0)
Hemoglobin: 8.5 g/dL — ABNORMAL LOW (ref 13.0–17.0)
Immature Granulocytes: 3 %
Lymphocytes Relative: 17 %
Lymphs Abs: 0.8 10*3/uL (ref 0.7–4.0)
MCH: 29.4 pg (ref 26.0–34.0)
MCHC: 30.7 g/dL (ref 30.0–36.0)
MCV: 95.8 fL (ref 80.0–100.0)
Monocytes Absolute: 0.2 10*3/uL (ref 0.1–1.0)
Monocytes Relative: 5 %
Neutro Abs: 3.3 10*3/uL (ref 1.7–7.7)
Neutrophils Relative %: 71 %
Platelets: 262 10*3/uL (ref 150–400)
RBC: 2.89 MIL/uL — ABNORMAL LOW (ref 4.22–5.81)
RDW: 15.7 % — ABNORMAL HIGH (ref 11.5–15.5)
WBC: 4.6 10*3/uL (ref 4.0–10.5)
nRBC: 0 % (ref 0.0–0.2)

## 2018-09-05 LAB — RENAL FUNCTION PANEL
Albumin: 1.8 g/dL — ABNORMAL LOW (ref 3.5–5.0)
Anion gap: 12 (ref 5–15)
BUN: 25 mg/dL — ABNORMAL HIGH (ref 6–20)
CO2: 25 mmol/L (ref 22–32)
Calcium: 7.7 mg/dL — ABNORMAL LOW (ref 8.9–10.3)
Chloride: 96 mmol/L — ABNORMAL LOW (ref 98–111)
Creatinine, Ser: 5.11 mg/dL — ABNORMAL HIGH (ref 0.61–1.24)
GFR calc Af Amer: 14 mL/min — ABNORMAL LOW (ref >60)
GFR calc non Af Amer: 12 mL/min — ABNORMAL LOW (ref >60)
Glucose, Bld: 251 mg/dL — ABNORMAL HIGH (ref 70–99)
Phosphorus: 3.3 mg/dL (ref 2.5–4.6)
Potassium: 3.3 mmol/L — ABNORMAL LOW (ref 3.5–5.1)
Sodium: 133 mmol/L — ABNORMAL LOW (ref 135–145)

## 2018-09-05 LAB — CULTURE, BLOOD (ROUTINE X 2)
Culture: NO GROWTH
Culture: NO GROWTH
Special Requests: ADEQUATE
Special Requests: ADEQUATE

## 2018-09-05 LAB — GLUCOSE, CAPILLARY
Glucose-Capillary: 320 mg/dL — ABNORMAL HIGH (ref 70–99)
Glucose-Capillary: 143 mg/dL — ABNORMAL HIGH (ref 70–99)
Glucose-Capillary: 320 mg/dL — ABNORMAL HIGH (ref 70–99)
Glucose-Capillary: 160 mg/dL — ABNORMAL HIGH (ref 70–99)

## 2018-09-05 MED ORDER — VANCOMYCIN HCL IN DEXTROSE 500-5 MG/100ML-% IV SOLN
INTRAVENOUS | Status: AC
  Administered 2018-09-05: 500 mg
  Filled 2018-09-05: qty 100

## 2018-09-05 MED ORDER — DOXERCALCIFEROL 4 MCG/2ML IV SOLN: 2.0000 ug | INTRAVENOUS | Status: DC

## 2018-09-05 MED ORDER — MIDODRINE HCL 5 MG PO TABS
ORAL_TABLET | ORAL | Status: AC
  Filled 2018-09-05: qty 2

## 2018-09-05 NOTE — Progress Notes (Signed)
Patient ID: Alvin Daniels, male   DOB: November 23, 1970, 48 y.o.   MRN: [id number] Patient was seen while in dialysis there is no drainage in the wound VAC canister.  Patient states that he would like to go home on Monday.  Patient may discharge to home once he is safe with therapy.

## 2018-09-05 NOTE — Progress Notes (Signed)
CSW acknowledges consult for SNF. Will need PT/OT evaluations before SNF process can begin.   CSW will continue to follow and assist with disposition.   Domenic Schwab, MSW, [place]

## 2018-09-05 NOTE — Progress Notes (Signed)
[facility] Progress Note  Patient: Alvin Daniels BHA:[id number]   PCP: [name], MD DOB: Aug 27, 1970   DOA: 08/29/2018   DOS: 09/05/2018   Date of Service: the patient was seen and examined on 09/05/2018  Brief hospital course: Pt. with PMH of ESRD on HD MWF, type II DM, HIV, HCV, left BKA, recent MRSA bacteremia; admitted on 08/29/2018, presented with complaint of abscess, was found to have MRSA bacteremia with osteomyelitis. Currently further plan is continue IV antibiotics, monitor Ortho recommendation.  Subjective: No acute complaint no nausea no vomiting no fever no chills.  Tolerated surgery very well yesterday.  Assessment and Plan: 1.  MRSA bacteremia Right ankle abscess with Charcot's joint. Osteomyelitis of right foot.  S/P BKA Presents with drainage from right foot ulcer, bedside I&D was performed in the ER.  Cultures were sent out which grew MRSA. Blood cultures were performed which also grew MRSA on admission. Repeat cultures so far negative. ID consulted. Echocardiogram-TTE negative for any significant vegetation or regurgitation.  Will not require TEE Started on IV vancomycin with HD. Initial CT scan was suggestive of no evidence of septic joint although an MRI is positive for osteomyelitis of distal fibula, talus, calcaneus, cuboid which is new since his last MRI 2 months ago. Orthopedic Dr. Sharol Given consulted, BKA, tolerated very well Resting right ABI indicates noncompressible arteries. Patient will continue vancomycin with HD for 2 weeks post BKA.  ID signed off  2. ESRD on HD. Anemia of chronic kidney disease Appreciate nephrology assistance. Hemodialysis on TTS. Hemoglobin currently stable. Management of anemia per nephrology.  3.  HIV. CD4 count dropped from 300-200. Current recommendation is to continue Biktarvy.  4. Chronic hypotension. Patient is on midodrine which I will continue. Blood pressure currently high 90s SBP. Asymptomatic.  Monitor.  5. Type 2 Diabetes Mellitus, uncontroled with renal and neuropathic complication last hemoglobin A1c was 9.0 in 06/18/2018. On insulin sliding scale sensitive with Basal insulin lantus.  6. Increased nutrient needs related to wound healing Body mass index is 21.31 kg/m.  Nutrition Problem: Increased nutrient needs Etiology: wound healing Interventions: Interventions: Prostat  Pressure Injury 08/21/16 (Active)  08/21/16 2020  Location: [place]  Location Orientation: Right  Staging:   Wound Description (Comments):   Present on Admission:      Diet: renal diet DVT Prophylaxis: subcutaneous Heparin  Advance goals of care discussion: full code  Family Communication: no family was present at bedside, at the time of interview.  Discussed with brother on the phone.  Disposition:  Discharge to be determined.  Consultants: Infectious disease, orthopedic Dr. Sharol Given, nephrology Procedures: ABI, BKA  Scheduled Meds: . bictegravir-emtricitabine-tenofovir AF  1 tablet Oral Daily  . Chlorhexidine Gluconate Cloth  6 each Topical Q0600  . darbepoetin (ARANESP) injection - DIALYSIS  200 mcg Intravenous Q Tue-HD  . docusate sodium  100 mg Oral BID  . doxercalciferol  2 mcg Intravenous Q T,Th,Sa-HD  . feeding supplement (PRO-STAT SUGAR FREE 64)  30 mL Oral BID  . heparin  5,000 Units Subcutaneous Q8H  . insulin aspart  0-5 Units Subcutaneous QHS  . insulin aspart  0-9 Units Subcutaneous TID WC  . insulin glargine  5 Units Subcutaneous BID  . lidocaine (PF)  10 mL Infiltration Once  . midodrine      . midodrine  10 mg Oral Once per day on Tue Thu Sat  . multivitamin  1 tablet Oral QHS  . vancomycin  500 mg Intravenous Q T,Th,Sa-HD   Continuous Infusions: .  sodium chloride    . methocarbamol (ROBAXIN) IV     PRN Meds: acetaminophen, bisacodyl, HYDROcodone-acetaminophen, HYDROmorphone (DILAUDID) injection, methocarbamol **OR** methocarbamol (ROBAXIN) IV, metoCLOPramide **OR**  metoCLOPramide (REGLAN) injection, ondansetron **OR** ondansetron (ZOFRAN) IV, oxyCODONE, oxyCODONE, polyethylene glycol Antibiotics: Anti-infectives (From admission, onward)   Start     Dose/Rate Route Frequency Ordered Stop   09/05/18 0747  vancomycin (VANCOCIN) 500-5 MG/100ML-% IVPB    Note to Pharmacy:  Anthonette Legato, Melisa   : cabinet override      09/05/18 0747 09/05/18 1020   09/04/18 0600  ceFAZolin (ANCEF) IVPB 2g/100 mL premix     2 g 200 mL/hr over 30 Minutes Intravenous To Short Stay 09/04/18 0250 09/04/18 1330   09/01/18 1200  vancomycin (VANCOCIN) IVPB 500 mg/100 ml premix     500 mg 100 mL/hr over 60 Minutes Intravenous Every T-Th-Sa (Hemodialysis) 08/30/18 0914 09/18/18 2359   08/30/18 1000  bictegravir-emtricitabine-tenofovir AF (BIKTARVY) 50-200-25 MG per tablet 1 tablet     1 tablet Oral Daily 08/29/18 2353     08/30/18 0600  cefTRIAXone (ROCEPHIN) 2 g in sodium chloride 0.9 % 100 mL IVPB  Status:  Discontinued     2 g 200 mL/hr over 30 Minutes Intravenous Every 24 hours 08/29/18 2358 08/30/18 1829   08/30/18 0600  metroNIDAZOLE (FLAGYL) IVPB 500 mg  Status:  Discontinued     500 mg 100 mL/hr over 60 Minutes Intravenous Every 8 hours 08/29/18 2358 08/30/18 1829   08/29/18 2315  vancomycin (VANCOCIN) 1,250 mg in sodium chloride 0.9 % 250 mL IVPB     1,250 mg 166.7 mL/hr over 90 Minutes Intravenous  Once 08/29/18 2303 08/30/18 0206   08/29/18 2315  ampicillin-sulbactam (UNASYN) 1.5 g in sodium chloride 0.9 % 100 mL IVPB  Status:  Discontinued     1.5 g 200 mL/hr over 30 Minutes Intravenous  Once 08/29/18 2303 08/29/18 2304   08/29/18 2315  piperacillin-tazobactam (ZOSYN) IVPB 2.25 g  Status:  Discontinued     2.25 g 100 mL/hr over 30 Minutes Intravenous  Once 08/29/18 2304 08/29/18 2307   08/29/18 2315  piperacillin-tazobactam (ZOSYN) IVPB 3.375 g     3.375 g 100 mL/hr over 30 Minutes Intravenous  Once 08/29/18 2307 08/30/18 0022       Objective: Physical Exam:  Vitals:   09/05/18 1045 09/05/18 1100 09/05/18 1118 09/05/18 1233  BP: 121/71 140/79 129/78 124/75  Pulse: 81 87 89 88  Resp: 20 20 20    Temp:   98 F (36.7 C) 98.6 F (37 C)  TempSrc:   Oral Oral  SpO2:   100%   Weight:   49.5 kg   Height:        Intake/Output Summary (Last 24 hours) at 09/05/2018 1643 Last data filed at 09/05/2018 1500 Gross per 24 hour  Intake 960 ml  Output 1000 ml  Net -40 ml   Filed Weights   09/04/18 2122 09/05/18 0745 09/05/18 1118  Weight: 50 kg 50.4 kg 49.5 kg    Data Reviewed: CBC: Recent Labs  Lab 08/29/18 2202 08/30/18 0457 08/31/18 0605 09/01/18 0434 09/03/18 0543 09/05/18 0738  WBC 12.0* 10.3 9.2 10.9* 7.7 4.6  NEUTROABS 11.2*  --   --   --  5.1 3.3  HGB 8.7* 7.5* 7.8* 7.3* 8.8* 8.5*  HCT 28.8* 24.8* 25.0* 24.0* 28.0* 27.7*  MCV 95.4 95.0 94.7 93.8 94.0 95.8  PLT 240 199 204 205 260 465   Basic Metabolic Panel: Recent Labs  Lab 08/31/18  3557 09/01/18 0434 09/03/18 0543 09/04/18 0713 09/05/18 0737  NA 134* 135 133* 136 133*  K 4.0 4.0 3.7 3.9 3.3*  CL 99 98 95* 96* 96*  CO2 22 21* 25 27 25   GLUCOSE 151* 125* 220* 146* 251*  BUN 54* 67* 43* 19 25*  CREATININE 9.89* 11.70* 8.13* 5.30* 5.11*  CALCIUM 8.8* 9.3 8.6* 8.6* 7.7*  PHOS  --   --   --   --  3.3    Liver Function Tests: Recent Labs  Lab 08/29/18 2202 09/05/18 0737  AST 18  --   ALT 9  --   ALKPHOS 80  --   BILITOT 1.0  --   PROT 7.6  --   ALBUMIN 2.3* 1.8*   No results for input(s): LIPASE, AMYLASE in the last 168 hours. No results for input(s): AMMONIA in the last 168 hours. Coagulation Profile: No results for input(s): INR, PROTIME in the last 168 hours. Cardiac Enzymes: No results for input(s): CKTOTAL, CKMB, CKMBINDEX, TROPONINI in the last 168 hours. BNP (last 3 results) No results for input(s): PROBNP in the last 8760 hours. CBG: Recent Labs  Lab 09/04/18 1641 09/04/18 2124 09/05/18 0647 09/05/18 1230 09/05/18 1640  GLUCAP 138* 331* 320*  143* 320*   Studies: No results found.   Time spent: 15 minutes  Author: Berle Mull, MD [facility] 09/05/2018 4:43 PM  To reach On-call, see care teams to locate the attending and reach out to them via [email or web address]. If 7PM-7AM, please contact night-coverage If you still have difficulty reaching the attending provider, please page the [facility] (Director on Call) for [facility] on amion for assistance.

## 2018-09-05 NOTE — Procedures (Signed)
I was present at this dialysis session. I have reviewed the session itself and made appropriate changes.   Vital signs in last 24 hours:  Temp:  [97.3 F (36.3 C)-98.6 F (37 C)] 98 F (36.7 C) (04/25 0438) Pulse Rate:  [75-93] 82 (04/25 0438) Resp:  [15-21] 19 (04/25 0438) BP: (96-128)/(57-78) 115/73 (04/25 0438) SpO2:  [99 %-100 %] 99 % (04/25 0438) Weight:  [50 kg] 50 kg (04/24 2122) Weight change: -2 kg Filed Weights   09/03/18 1622 09/03/18 2018 09/04/18 2122  Weight: 50.1 kg 49.3 kg 50 kg    Recent Labs  Lab 09/05/18 0737  NA 133*  K 3.3*  CL 96*  CO2 25  GLUCOSE 251*  BUN 25*  CREATININE 5.11*  CALCIUM 7.7*  PHOS 3.3    Recent Labs  Lab 08/29/18 2202  09/01/18 0434 09/03/18 0543 09/05/18 0738  WBC 12.0*   < > 10.9* 7.7 4.6  NEUTROABS 11.2*  --   --  5.1 3.3  HGB 8.7*   < > 7.3* 8.8* 8.5*  HCT 28.8*   < > 24.0* 28.0* 27.7*  MCV 95.4   < > 93.8 94.0 95.8  PLT 240   < > 205 260 262   < > = values in this interval not displayed.    Scheduled Meds: . bictegravir-emtricitabine-tenofovir AF  1 tablet Oral Daily  . Chlorhexidine Gluconate Cloth  6 each Topical Q0600  . darbepoetin (ARANESP) injection - DIALYSIS  200 mcg Intravenous Q Tue-HD  . docusate sodium  100 mg Oral BID  . feeding supplement (PRO-STAT SUGAR FREE 64)  30 mL Oral BID  . heparin  5,000 Units Subcutaneous Q8H  . insulin aspart  0-5 Units Subcutaneous QHS  . insulin aspart  0-9 Units Subcutaneous TID WC  . insulin glargine  5 Units Subcutaneous BID  . lidocaine (PF)  10 mL Infiltration Once  . midodrine      . midodrine  10 mg Oral Once per day on Tue Thu Sat  . multivitamin  1 tablet Oral QHS  . vancomycin      . vancomycin  500 mg Intravenous Q T,Th,Sa-HD   Continuous Infusions: . sodium chloride    . methocarbamol (ROBAXIN) IV     PRN Meds:.acetaminophen, bisacodyl, HYDROcodone-acetaminophen, HYDROmorphone (DILAUDID) injection, methocarbamol **OR** methocarbamol (ROBAXIN) IV,  metoCLOPramide **OR** metoCLOPramide (REGLAN) injection, ondansetron **OR** ondansetron (ZOFRAN) IV, oxyCODONE, oxyCODONE, polyethylene glycol    Dialysis Orders: TTS at [facility] 4hr, 400/A1.5, EDW 52kg, 2K/2Ca, Linear Na, AVF, heparin 2000 bolus - Hectoral 39mcg IV q HD - Just finished course of Venofer - Mircera 112mcg IV q 2 weeks (last given 4/7)  Assessment/Plan: 1.s/p R BKA due to Right ankle abscess.History of osteomyelitis in 06/2018. S/p I&D in ED. Blood culture +MRSA. Continuevancomycinper primary.Per infectious disease notes, MRI confirmed osteomyelitis. 2. ESRD:TTS schedule. No evidence of volume overload on exam  3.Hypotension/Volume: No evidence of volume overload on exam. BP chronically low during HD. Midodrine pre-HD on outpatient med list but has not been taking as he is unable to afford the medication. 4. Anemia:Hgb 8.8. Mircera 150 mcg last dose 4/7. Given aranesp on 4.21.  5. Secondary hyperparathyroidism:Corrected calcium 9.9, restart hectorol for now- 6. Nutrition:Albumin 2.3- will start supplement and renal vitamin.  7. T2DM:per primary 8. HIV:per primary/infectious disease  Donetta Potts,  MD 09/05/2018, 8:12 AM

## 2018-09-05 NOTE — Progress Notes (Signed)
Patient at procedure or test/unavailable. PT at HD. OT to follow up next available treatment day.  Ebony Hail Harold Hedge) Marsa Aris OTR/L Acute Rehabilitation Services Pager: [phone] Office: [phone]

## 2018-09-05 NOTE — Progress Notes (Signed)
PT Cancellation Note  Patient Details Name: Alvin Daniels MRN: [id number] DOB: [birth year]   Cancelled Treatment:    Reason Eval/Treat Not Completed: Patient at procedure or test/unavailable   Reinaldo Berber, PT, DPT Acute Rehabilitation Services Pager: [phone] Office: [phone]    Reinaldo Berber 09/05/2018, 8:01 AM

## 2018-09-05 NOTE — Progress Notes (Signed)
Pharmacy Antibiotic Note  Alvin Daniels is a 48 y.o. male admitted on 08/29/2018 with MRSA bacteremia and OM.  Pharmacy has been consulted for vancomycin dosing.  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 He is afebrile and WBC remains wnl. Per ID, pt will continue on vancomycin through 5/8 (stop date has been entered). Will plan to get another pre-HD vancomycin level on 4/28 if pt is still admitted.  Plan: Continue Vancomycin 500 mg qTTS with HD Monitor HD toleration/schedule, pre-HD vanc level 4/28  Height: 5' (152.4 cm) Weight: 111 lb 1.8 oz (50.4 kg) IBW/kg (Calculated) : 50  Temp (24hrs), Avg:97.8 F (36.6 C), Min:97.3 F (36.3 C), Max:98.3 F (36.8 C)  Recent Labs  Lab 08/29/18 2202 08/30/18 0457 08/31/18 0605 09/01/18 0434 09/01/18 0750 09/03/18 0543 09/03/18 1439 09/04/18 0713 09/05/18 0737 09/05/18 0738  WBC 12.0* 10.3 9.2 10.9*  --  7.7  --   --   --  4.6  CREATININE 7.59* 8.01* 9.89* 11.70*  --  8.13*  --  5.30* 5.11*  --   LATICACIDVEN 1.5 0.9  --   --   --   --   --   --   --   --   VANCOTROUGH  --   --   --   --  18  --   --   --   --   --   VANCORANDOM  --   --   --   --   --   --  14  --   --   --     Estimated Creatinine Clearance: 12.6 mL/min (A) (by C-G formula based on SCr of 5.11 mg/dL (H)).    No Known Allergies   Antimicrobials this admission: Ceftriazone 4/19 Vancomycin 4/19>> (5/8) Flagyl 4/19  Dose adjustments this admission: Pre-HD VR level on 4/21= 18 mcg/ml >> no dose change  Microbiology results: 4/18: Blood Cxs: MRSA, R tetracycline 4/19 wound rt foot: MRSA, R tetracycline 4/20 Bcx: ngtd 4/24 surgical PCR: MRSA, SA negative   Jackson Latino, PharmD PGY1 Pharmacy Resident Phone [phone] 09/05/2018     10:43 AM

## 2018-09-05 NOTE — Progress Notes (Signed)
INFECTIOUS DISEASE PROGRESS NOTE  ID: Alvin Daniels is a 48 y.o. male with  Principal Problem:   MRSA bacteremia Active Problems:   Diabetes mellitus with ESRD (end-stage renal disease) ([place])   Chronic diarrhea   ESRD on dialysis ([place])   HIV disease ([place])   Charcot foot due to diabetes mellitus ([place])   Abscess of ankle   Abscess of foot  Subjective: Concerned about his vacuum device  Abtx:  Anti-infectives (From admission, onward)   Start     Dose/Rate Route Frequency Ordered Stop   09/05/18 0747  vancomycin (VANCOCIN) 500-5 MG/100ML-% IVPB    Note to Pharmacy:  Anthonette Legato, Melisa   : cabinet override      09/05/18 0747 09/05/18 1020   09/04/18 0600  ceFAZolin (ANCEF) IVPB 2g/100 mL premix     2 g 200 mL/hr over 30 Minutes Intravenous To Short Stay 09/04/18 0250 09/04/18 1330   09/01/18 1200  vancomycin (VANCOCIN) IVPB 500 mg/100 ml premix     500 mg 100 mL/hr over 60 Minutes Intravenous Every T-Th-Sa (Hemodialysis) 08/30/18 0914 09/18/18 2359   08/30/18 1000  bictegravir-emtricitabine-tenofovir AF (BIKTARVY) 50-200-25 MG per tablet 1 tablet     1 tablet Oral Daily 08/29/18 2353     08/30/18 0600  cefTRIAXone (ROCEPHIN) 2 g in sodium chloride 0.9 % 100 mL IVPB  Status:  Discontinued     2 g 200 mL/hr over 30 Minutes Intravenous Every 24 hours 08/29/18 2358 08/30/18 1829   08/30/18 0600  metroNIDAZOLE (FLAGYL) IVPB 500 mg  Status:  Discontinued     500 mg 100 mL/hr over 60 Minutes Intravenous Every 8 hours 08/29/18 2358 08/30/18 1829   08/29/18 2315  vancomycin (VANCOCIN) 1,250 mg in sodium chloride 0.9 % 250 mL IVPB     1,250 mg 166.7 mL/hr over 90 Minutes Intravenous  Once 08/29/18 2303 08/30/18 0206   08/29/18 2315  ampicillin-sulbactam (UNASYN) 1.5 g in sodium chloride 0.9 % 100 mL IVPB  Status:  Discontinued     1.5 g 200 mL/hr over 30 Minutes Intravenous  Once 08/29/18 2303 08/29/18 2304   08/29/18 2315  piperacillin-tazobactam (ZOSYN) IVPB 2.25 g  Status:   Discontinued     2.25 g 100 mL/hr over 30 Minutes Intravenous  Once 08/29/18 2304 08/29/18 2307   08/29/18 2315  piperacillin-tazobactam (ZOSYN) IVPB 3.375 g     3.375 g 100 mL/hr over 30 Minutes Intravenous  Once 08/29/18 2307 08/30/18 0022      Medications:  Scheduled:  bictegravir-emtricitabine-tenofovir AF  1 tablet Oral Daily   Chlorhexidine Gluconate Cloth  6 each Topical Q0600   darbepoetin (ARANESP) injection - DIALYSIS  200 mcg Intravenous Q Tue-HD   docusate sodium  100 mg Oral BID   doxercalciferol  2 mcg Intravenous Q T,Th,Sa-HD   feeding supplement (PRO-STAT SUGAR FREE 64)  30 mL Oral BID   heparin  5,000 Units Subcutaneous Q8H   insulin aspart  0-5 Units Subcutaneous QHS   insulin aspart  0-9 Units Subcutaneous TID WC   insulin glargine  5 Units Subcutaneous BID   lidocaine (PF)  10 mL Infiltration Once   midodrine       midodrine  10 mg Oral Once per day on Tue Thu Sat   multivitamin  1 tablet Oral QHS   vancomycin  500 mg Intravenous Q T,Th,Sa-HD    Objective: Vital signs in last 24 hours: Temp:  [97.3 F (36.3 C)-98.6 F (37 C)] 98.6 F (37 C) (04/25 1233)  Pulse Rate:  [75-89] 88 (04/25 1233) Resp:  [15-21] 20 (04/25 1118) BP: (88-140)/(51-79) 124/75 (04/25 1233) SpO2:  [99 %-100 %] 100 % (04/25 1118) Weight:  [49.5 kg-50.4 kg] 49.5 kg (04/25 1118)   General appearance: alert and no distress Incision/Wound: vac in place, clean.   Lab Results Recent Labs    09/03/18 0543 09/04/18 0713 09/05/18 0737 09/05/18 0738  WBC 7.7  --   --  4.6  HGB 8.8*  --   --  8.5*  HCT 28.0*  --   --  27.7*  NA 133* 136 133*  --   K 3.7 3.9 3.3*  --   CL 95* 96* 96*  --   CO2 25 27 25   --   BUN 43* 19 25*  --   CREATININE 8.13* 5.30* 5.11*  --    Liver Panel Recent Labs    09/05/18 0737  ALBUMIN 1.8*   Sedimentation Rate No results for input(s): ESRSEDRATE in the last 72 hours. C-Reactive Protein No results for input(s): CRP in the last  72 hours.  Microbiology: Recent Results (from the past 240 hour(s))  Blood culture (routine x 2)     Status: Abnormal   Collection Time: 08/29/18 10:10 PM  Result Value Ref Range Status   Specimen Description BLOOD RIGHT WRIST  Final   Special Requests   Final    BOTTLES DRAWN AEROBIC ONLY Blood Culture results may not be optimal due to an inadequate volume of blood received in culture bottles   Culture  Setup Time   Final    GRAM POSITIVE COCCI IN CLUSTERS AEROBIC BOTTLE ONLY CRITICAL RESULT CALLED TO, READ BACK BY AND VERIFIED WITH: PHARMD PIERCE, D 1723 [id number] FCP    Culture (A)  Final    STAPHYLOCOCCUS AUREUS SUSCEPTIBILITIES PERFORMED ON PREVIOUS CULTURE WITHIN THE LAST 5 DAYS. Performed at [facility], [place] [address], [place], [place] [address]    Report Status 09/01/2018 FINAL  Final  Blood Culture ID Panel (Reflexed)     Status: Abnormal   Collection Time: 08/29/18 10:10 PM  Result Value Ref Range Status   Enterococcus species NOT DETECTED NOT DETECTED Final   Listeria monocytogenes NOT DETECTED NOT DETECTED Final   Staphylococcus species DETECTED (A) NOT DETECTED Final    Comment: CRITICAL RESULT CALLED TO, READ BACK BY AND VERIFIED WITH: PHARMD PIERCE, D 1723 [id number] FCP    Staphylococcus aureus (BCID) DETECTED (A) NOT DETECTED Final    Comment: Methicillin (oxacillin)-resistant Staphylococcus aureus (MRSA). MRSA is predictably resistant to beta-lactam antibiotics (except ceftaroline). Preferred therapy is vancomycin unless clinically contraindicated. Patient requires contact precautions if  hospitalized. CRITICAL RESULT CALLED TO, READ BACK BY AND VERIFIED WITH: PHARMD PIERCE, D 1723 [id number] FCP    Methicillin resistance DETECTED (A) NOT DETECTED Final    Comment: CRITICAL RESULT CALLED TO, READ BACK BY AND VERIFIED WITH: PHARMD PIERCE, D 1723 [id number] FCP    Streptococcus species NOT DETECTED NOT DETECTED Final   Streptococcus agalactiae NOT DETECTED NOT  DETECTED Final   Streptococcus pneumoniae NOT DETECTED NOT DETECTED Final   Streptococcus pyogenes NOT DETECTED NOT DETECTED Final   Acinetobacter baumannii NOT DETECTED NOT DETECTED Final   Enterobacteriaceae species NOT DETECTED NOT DETECTED Final   Enterobacter cloacae complex NOT DETECTED NOT DETECTED Final   Escherichia coli NOT DETECTED NOT DETECTED Final   Klebsiella oxytoca NOT DETECTED NOT DETECTED Final   Klebsiella pneumoniae NOT DETECTED NOT DETECTED Final   Proteus species NOT DETECTED  NOT DETECTED Final   Serratia marcescens NOT DETECTED NOT DETECTED Final   Haemophilus influenzae NOT DETECTED NOT DETECTED Final   Neisseria meningitidis NOT DETECTED NOT DETECTED Final   Pseudomonas aeruginosa NOT DETECTED NOT DETECTED Final   Candida albicans NOT DETECTED NOT DETECTED Final   Candida glabrata NOT DETECTED NOT DETECTED Final   Candida krusei NOT DETECTED NOT DETECTED Final   Candida parapsilosis NOT DETECTED NOT DETECTED Final   Candida tropicalis NOT DETECTED NOT DETECTED Final    Comment: Performed at [facility], [place] [address], [place], [place] [address]  Blood culture (routine x 2)     Status: Abnormal   Collection Time: 08/29/18 10:16 PM  Result Value Ref Range Status   Specimen Description BLOOD RIGHT HAND  Final   Special Requests   Final    BOTTLES DRAWN AEROBIC ONLY Blood Culture results may not be optimal due to an inadequate volume of blood received in culture bottles   Culture  Setup Time   Final    GRAM POSITIVE COCCI AEROBIC BOTTLE ONLY CRITICAL RESULT CALLED TO, READ BACK BY AND VERIFIED WITH: Marland Mcalpine [id number] MLM Performed at [facility], [place] [address] [place], [place] [address]    Culture METHICILLIN RESISTANT STAPHYLOCOCCUS AUREUS (A)  Final   Report Status 09/01/2018 FINAL  Final   Organism ID, Bacteria METHICILLIN RESISTANT STAPHYLOCOCCUS AUREUS  Final      Susceptibility   Methicillin resistant staphylococcus aureus -  MIC*    CIPROFLOXACIN <=0.5 SENSITIVE Sensitive     ERYTHROMYCIN >=8 RESISTANT Resistant     GENTAMICIN <=0.5 SENSITIVE Sensitive     OXACILLIN >=4 RESISTANT Resistant     TETRACYCLINE >=16 RESISTANT Resistant     VANCOMYCIN 1 SENSITIVE Sensitive     TRIMETH/SULFA <=10 SENSITIVE Sensitive     CLINDAMYCIN <=0.25 SENSITIVE Sensitive     RIFAMPIN <=0.5 SENSITIVE Sensitive     Inducible Clindamycin NEGATIVE Sensitive     * METHICILLIN RESISTANT STAPHYLOCOCCUS AUREUS  Blood Culture ID Panel (Reflexed)     Status: Abnormal   Collection Time: 08/29/18 10:16 PM  Result Value Ref Range Status   Enterococcus species NOT DETECTED NOT DETECTED Final   Listeria monocytogenes NOT DETECTED NOT DETECTED Final   Staphylococcus species DETECTED (A) NOT DETECTED Final    Comment: CRITICAL RESULT CALLED TO, READ BACK BY AND VERIFIED WITH: PHARMD J [place] [id number] MLM    Staphylococcus aureus (BCID) DETECTED (A) NOT DETECTED Final    Comment: Methicillin (oxacillin)-resistant Staphylococcus aureus (MRSA). MRSA is predictably resistant to beta-lactam antibiotics (except ceftaroline). Preferred therapy is vancomycin unless clinically contraindicated. Patient requires contact precautions if  hospitalized. CRITICAL RESULT CALLED TO, READ BACK BY AND VERIFIED WITH: PHARMD J [place] [id number] MLM    Methicillin resistance DETECTED (A) NOT DETECTED Final    Comment: CRITICAL RESULT CALLED TO, READ BACK BY AND VERIFIED WITH: PHARMD J [place] [id number] MLM    Streptococcus species NOT DETECTED NOT DETECTED Final   Streptococcus agalactiae NOT DETECTED NOT DETECTED Final   Streptococcus pneumoniae NOT DETECTED NOT DETECTED Final   Streptococcus pyogenes NOT DETECTED NOT DETECTED Final   Acinetobacter baumannii NOT DETECTED NOT DETECTED Final   Enterobacteriaceae species NOT DETECTED NOT DETECTED Final   Enterobacter cloacae complex NOT DETECTED NOT DETECTED Final   Escherichia coli NOT DETECTED NOT  DETECTED Final   Klebsiella oxytoca NOT DETECTED NOT DETECTED Final   Klebsiella pneumoniae NOT DETECTED NOT DETECTED  Final   Proteus species NOT DETECTED NOT DETECTED Final   Serratia marcescens NOT DETECTED NOT DETECTED Final   Haemophilus influenzae NOT DETECTED NOT DETECTED Final   Neisseria meningitidis NOT DETECTED NOT DETECTED Final   Pseudomonas aeruginosa NOT DETECTED NOT DETECTED Final   Candida albicans NOT DETECTED NOT DETECTED Final   Candida glabrata NOT DETECTED NOT DETECTED Final   Candida krusei NOT DETECTED NOT DETECTED Final   Candida parapsilosis NOT DETECTED NOT DETECTED Final   Candida tropicalis NOT DETECTED NOT DETECTED Final    Comment: Performed at [facility], [place] [address], [place], [place] [address]  Wound or Superficial Culture     Status: None   Collection Time: 08/30/18 12:37 AM  Result Value Ref Range Status   Specimen Description ABSCESS  Final   Special Requests RIGHT FOOT  Final   Gram Stain   Final    FEW WBC PRESENT, PREDOMINANTLY PMN NO ORGANISMS SEEN Performed at [facility], [place] [address], [place], [place] [address]    Culture FEW METHICILLIN RESISTANT STAPHYLOCOCCUS AUREUS  Final   Report Status 09/01/2018 FINAL  Final   Organism ID, Bacteria METHICILLIN RESISTANT STAPHYLOCOCCUS AUREUS  Final      Susceptibility   Methicillin resistant staphylococcus aureus - MIC*    CIPROFLOXACIN <=0.5 SENSITIVE Sensitive     ERYTHROMYCIN >=8 RESISTANT Resistant     GENTAMICIN <=0.5 SENSITIVE Sensitive     OXACILLIN >=4 RESISTANT Resistant     TETRACYCLINE >=16 RESISTANT Resistant     VANCOMYCIN 1 SENSITIVE Sensitive     TRIMETH/SULFA <=10 SENSITIVE Sensitive     CLINDAMYCIN <=0.25 SENSITIVE Sensitive     RIFAMPIN <=0.5 SENSITIVE Sensitive     Inducible Clindamycin NEGATIVE Sensitive     * FEW METHICILLIN RESISTANT STAPHYLOCOCCUS AUREUS  Culture, blood (routine x 2)     Status: None   Collection Time: 08/31/18  1:30 PM  Result Value  Ref Range Status   Specimen Description BLOOD RIGHT ARM  Final   Special Requests AEROBIC BOTTLE ONLY Blood Culture adequate volume  Final   Culture   Final    NO GROWTH 5 DAYS Performed at [facility], [place] [address], [place], [place] [address]    Report Status 09/05/2018 FINAL  Final  Culture, blood (routine x 2)     Status: None   Collection Time: 08/31/18  2:00 PM  Result Value Ref Range Status   Specimen Description BLOOD RIGHT ANTECUBITAL  Final   Special Requests AEROBIC BOTTLE ONLY Blood Culture adequate volume  Final   Culture   Final    NO GROWTH 5 DAYS Performed at [facility], [place] [address], [place], [place] [address]    Report Status 09/05/2018 FINAL  Final  Surgical pcr screen     Status: None   Collection Time: 09/04/18  8:59 AM  Result Value Ref Range Status   MRSA, PCR NEGATIVE NEGATIVE Final   Staphylococcus aureus NEGATIVE NEGATIVE Final    Comment: (NOTE) The Xpert SA Assay (FDA approved for NASAL specimens in patients 23 years of age and older), is one component of a comprehensive surveillance program. It is not intended to diagnose infection nor to guide or monitor treatment. Performed at [facility], [place] [address], [place], [place] [address]     Studies/Results: No results found.   Assessment/Plan: R BKA (4-24) MRSA bacteremia ESRD AIDS  Total days of antibiotics: 6 vanco  Plan for  14 days of IV vanco with HD starting day count from 4-24 Needs repeat BCx at end of therapy Repeat BCx 4-20 (-) No change to biktarvy, suspect low CD4 from stress of bacteremia Please have him f/u in ID clinic Available as needed         [name] MD, FACP Infectious Diseases (pager) [phone] [email or web address] 09/05/2018, 1:51 PM  LOS: 6 days

## 2018-09-06 LAB — GLUCOSE, CAPILLARY
Glucose-Capillary: 234 mg/dL — ABNORMAL HIGH (ref 70–99)
Glucose-Capillary: 206 mg/dL — ABNORMAL HIGH (ref 70–99)
Glucose-Capillary: 179 mg/dL — ABNORMAL HIGH (ref 70–99)
Glucose-Capillary: 344 mg/dL — ABNORMAL HIGH (ref 70–99)
Glucose-Capillary: 303 mg/dL — ABNORMAL HIGH (ref 70–99)

## 2018-09-06 MED ORDER — MIDODRINE HCL 5 MG PO TABS
5.0000 mg | ORAL_TABLET | Freq: Once | ORAL | Status: AC
  Administered 2018-09-06: 5 mg via ORAL
  Filled 2018-09-06: qty 1

## 2018-09-06 MED ORDER — INSULIN GLARGINE 100 UNIT/ML ~~LOC~~ SOLN
10.0000 [IU] | Freq: Every day | SUBCUTANEOUS | Status: DC
  Administered 2018-09-07: 10 [IU] via SUBCUTANEOUS
  Filled 2018-09-06 (×2): qty 0.1

## 2018-09-06 NOTE — Progress Notes (Addendum)
[facility] Progress Note   Subjective:   Patient seen in room. Spanish interpreter used via telephone. No new concerns. Denies dyspnea, SOB, CP, palpitations, N/V. Reports occasional diarrhea but no abdominal pain.   Objective Vitals:   09/05/18 1643 09/05/18 2228 09/06/18 0114 09/06/18 0517  BP: 103/75 (!) 92/49  100/68  Pulse: 87 80  78  Resp: 18 16  17   Temp: 98.6 F (37 C) 98 F (36.7 C)  98.1 F (36.7 C)  TempSrc: Oral Oral  Oral  SpO2: 100% 99%  100%  Weight:  48.3 kg 48.3 kg   Height:       Physical Exam General: Well developed male, sitting up in bed in NAD Heart: RRR, no murmurs, rubs or gallops. Lungs: CTA bilaterally without wheezing, rhonchi or rales Abdomen: Soft, non-distended, non-tender. + BS Extremities: L BKA, new R BKA Dialysis Access: LUE AVF + thrill  Additional Objective Labs: Basic Metabolic Panel: Recent Labs  Lab 09/03/18 0543 09/04/18 0713 09/05/18 0737  NA 133* 136 133*  K 3.7 3.9 3.3*  CL 95* 96* 96*  CO2 25 27 25   GLUCOSE 220* 146* 251*  BUN 43* 19 25*  CREATININE 8.13* 5.30* 5.11*  CALCIUM 8.6* 8.6* 7.7*  PHOS  --   --  3.3   Liver Function Tests: Recent Labs  Lab 09/05/18 0737  ALBUMIN 1.8*   No results for input(s): LIPASE, AMYLASE in the last 168 hours. CBC: Recent Labs  Lab 08/31/18 0605 09/01/18 0434 09/03/18 0543 09/05/18 0738  WBC 9.2 10.9* 7.7 4.6  NEUTROABS  --   --  5.1 3.3  HGB 7.8* 7.3* 8.8* 8.5*  HCT 25.0* 24.0* 28.0* 27.7*  MCV 94.7 93.8 94.0 95.8  PLT 204 205 260 262   Blood Culture    Component Value Date/Time   SDES BLOOD RIGHT ANTECUBITAL 08/31/2018 1400   SPECREQUEST AEROBIC BOTTLE ONLY Blood Culture adequate volume 08/31/2018 1400   CULT  08/31/2018 1400    NO GROWTH 5 DAYS Performed at [facility], [place] [address], [place], [place] [address]    REPTSTATUS 09/05/2018 FINAL 08/31/2018 1400    Cardiac Enzymes: No results for input(s): CKTOTAL, CKMB, CKMBINDEX,  TROPONINI in the last 168 hours. CBG: Recent Labs  Lab 09/05/18 1230 09/05/18 1640 09/05/18 2229 09/06/18 0318 09/06/18 0714  GLUCAP 143* 320* 160* 234* 206*   Iron Studies: No results for input(s): IRON, TIBC, TRANSFERRIN, FERRITIN in the last 72 hours. @lablastinr3 @ Studies/Results: No results found. Medications: . sodium chloride    . methocarbamol (ROBAXIN) IV     . bictegravir-emtricitabine-tenofovir AF  1 tablet Oral Daily  . Chlorhexidine Gluconate Cloth  6 each Topical Q0600  . darbepoetin (ARANESP) injection - DIALYSIS  200 mcg Intravenous Q Tue-HD  . docusate sodium  100 mg Oral BID  . doxercalciferol  2 mcg Intravenous Q T,Th,Sa-HD  . feeding supplement (PRO-STAT SUGAR FREE 64)  30 mL Oral BID  . heparin  5,000 Units Subcutaneous Q8H  . insulin aspart  0-5 Units Subcutaneous QHS  . insulin aspart  0-9 Units Subcutaneous TID WC  . insulin glargine  5 Units Subcutaneous BID  . lidocaine (PF)  10 mL Infiltration Once  . midodrine  10 mg Oral Once per day on Tue Thu Sat  . multivitamin  1 tablet Oral QHS  . vancomycin  500 mg Intravenous Q T,Th,Sa-HD    Dialysis Orders: TTS at [facility] 4hr, 400/A1.5, EDW 52kg, 2K/2Ca, Linear Na, AVF, heparin 2000  bolus - Hectoral 56mcg IV q HD - Just finished course of Venofer - Mircera 161mcg IV q 2 weeks (last given 4/7)  Assessment/Plan: 1.s/p R BKA due to Right ankle abscess.History of osteomyelitis in 06/2018. S/p I&D in ED, now s/p BKA . Blood culture +MRSA. Continuevancomycinper primary.Per infectious disease notes, MRI confirmed osteomyelitis. 2. ESRD:TTS schedule. No evidence of volume overload on exam, BP soft/stable.  3.Hypotension/Volume: No evidence of volume overload on exam. BP chronically low during HD. Midodrine pre-HD on outpatient med list but has not been taking as he is unable to afford the medication. 4. Anemia:Hgb8.5. Mircera 150 mcg last dose 4/7. Given aranesp on 4/21. 5. Secondary  hyperparathyroidism:Calcium 7.7, Corrected calcium 9.5. Hectorol restarted.  6. Nutrition:Albumin 1.8. Continue pro-stat supplement  7. T2DM:per primary 8. HIV:per primary/infectious disease  Anice Paganini, PA-C 09/06/2018, 10:00 AM  [facility] Pager: [phone]  I have seen and examined this patient and agree with plan and assessment in the above note with renal recommendations/intervention highlighted.  Broadus John A Constancia Geeting,MD 09/06/2018 11:14 AM

## 2018-09-06 NOTE — Evaluation (Signed)
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 Pt PMHx: L BKA, T2DM, HTN, anemia, ESRD, hypotension  Clinical Impression  Pt admitted with above diagnosis. Pt currently with functional limitations due to the deficits listed below (see PT Problem List). PTA, pt living at home alone and was mod I with prosthetic leg. Today min A for AP tranfers to bedside chair. Pt will benefit from skilled PT to increase their independence and safety with mobility to allow discharge to the venue listed below.       Follow Up Recommendations CIR    Equipment Recommendations  Wheelchair (measurements PT);3in1 (PT);Standard walker    Recommendations for Other Services       Precautions / Restrictions Precautions Precautions: Fall;Other (comment) Precaution Comments: wound vac Restrictions Weight Bearing Restrictions: Yes RLE Weight Bearing: Non weight bearing      Mobility  Bed Mobility Overal bed mobility: Modified Independent             General bed mobility comments: no needs identified  Transfers Overall transfer level: Needs assistance Equipment used: None Transfers: Garment/textile technologist transfers: Min assist  Lateral/Scoot Transfers: Min assist General transfer comment: Pt requiring stabilization and strong use of his BUEs to assist with transfer  Ambulation/Gait                Stairs            Wheelchair Mobility    Modified Rankin (Stroke Patients Only)       Balance Overall balance assessment: Needs assistance   Sitting balance-Leahy Scale: Good Sitting balance - Comments: lifting BLEs for movement with no LOB epidoes                                     Pertinent Vitals/Pain Pain Assessment: Faces Faces  Pain Scale: Hurts little more Pain Location:  R BKA Pain Descriptors / Indicators: Discomfort Pain Intervention(s): Limited activity within patient's tolerance;Monitored during session    Home Living Family/patient expects to be discharged to:: Private residence Living Arrangements: Alone   Type of Home: Apartment       Home Layout: One level Home Equipment: None      Prior Function Level of Independence: Independent with assistive device(s)         Comments: was using RW not does not have it anymore; " I have a friend that assists with transportation, groceries and everyday needs, but she is unable to come in here." Pt reports that he was hopping with RW and using prosthetic x1 month before this admission.     Hand Dominance   Dominant Hand: Right    Extremity/Trunk Assessment   Upper Extremity Assessment Upper Extremity Assessment: Generalized weakness    Lower Extremity Assessment Lower Extremity Assessment: RLE deficits/detail;LLE deficits/detail RLE Deficits / Details: s/p BKA LLE Deficits / Details: s/p BKA    Cervical / Trunk Assessment Cervical / Trunk Assessment: Normal  Communication   Communication: Prefers language other than English(spanish)  Cognition Arousal/Alertness: Awake/alert Behavior During Therapy: WFL for tasks assessed/performed Overall Cognitive Status: Within Functional Limits for tasks assessed  General Comments General comments (skin integrity, edema, etc.): Pt has caregiver that can assist as needed, but pt does not have any DME to assist with mobility. Pt asking for power wheelchair as his transportation does not always park close to his apartment and he has long hallways to go up/down; pt plans to have friend assist, but has 12" curb to go up/down to parking lot.    Exercises     Assessment/Plan    PT Assessment Patient needs continued PT services  PT Problem List          PT Treatment Interventions      PT Goals (Current goals can be found in the Care Plan section)  Acute Rehab PT Goals Patient Stated Goal: to go home    Frequency Min 3X/week   Barriers to discharge        Co-evaluation PT/OT/SLP Co-Evaluation/Treatment: Yes Reason for Co-Treatment: For patient/therapist safety PT goals addressed during session: Mobility/safety with mobility;Proper use of DME OT goals addressed during session: ADL's and self-care       AM-PAC PT "6 Clicks" Mobility  Outcome Measure Help needed turning from your back to your side while in a flat bed without using bedrails?: A Little Help needed moving from lying on your back to sitting on the side of a flat bed without using bedrails?: A Little Help needed moving to and from a bed to a chair (including a wheelchair)?: A Little Help needed standing up from a chair using your arms (e.g., wheelchair or bedside chair)?: Total Help needed to walk in hospital room?: Total Help needed climbing 3-5 steps with a railing? : Total 6 Click Score: 12    End of Session   Activity Tolerance: Patient tolerated treatment well Patient left: in bed;with call bell/phone within reach Nurse Communication: Mobility status PT Visit Diagnosis: Difficulty in walking, not elsewhere classified (R26.2)    Time: 7672-0947 PT Time Calculation (min) (ACUTE ONLY): 34 min   Charges:   PT Evaluation $PT Eval Moderate Complexity: 1 Mod          Alvin Daniels, PT, DPT Acute Rehabilitation Services Pager: [phone] Office: [place] 09/06/2018, 10:50 AM

## 2018-09-06 NOTE — Progress Notes (Signed)
Rehab Admissions Coordinator Note:  Per PT and OT recommendation, this patient was screened by Jhonnie Garner for appropriateness for an Inpatient Acute Rehab Consult.  At this time, we are recommending an Inpatient Rehab consult. AC will contact MD to request an IP Rehab Consult Order.   Jhonnie Garner 09/06/2018, 11:21 AM  I can be reached at [phone].

## 2018-09-06 NOTE — Care Management (Signed)
Patient has a CSW consult for potential SNF, requested MD to order a COVID test in preparation for possible SNF disposition.

## 2018-09-06 NOTE — Progress Notes (Signed)
[facility] Progress Note  Patient: Alvin Daniels ZOX:[id number]   PCP: [name], MD DOB: 11/22/1970   DOA: 08/29/2018   DOS: 09/06/2018   Date of Service: the patient was seen and examined on 09/06/2018  Brief hospital course: Pt. with PMH of ESRD on HD MWF, type II DM, HIV, HCV, left BKA, recent MRSA bacteremia; admitted on 08/29/2018, presented with complaint of abscess, was found to have MRSA bacteremia with osteomyelitis. Currently further plan is continue IV antibiotics, monitor Ortho recommendation.  Subjective: No acute complaint no fever no chills. Patient adamantly refusing CIR and would like to go home.  Assessment and Plan: 1.  MRSA bacteremia Right ankle abscess with Charcot's joint. Osteomyelitis of right foot.  S/P BKA Presents with drainage from right foot ulcer, bedside I&D was performed in the ER.  Cultures were sent out which grew MRSA. Blood cultures were performed which also grew MRSA on admission. Repeat cultures so far negative. ID consulted. Echocardiogram-TTE negative for any significant vegetation or regurgitation.  Will not require TEE Started on IV vancomycin with HD. Initial CT scan was suggestive of no evidence of septic joint although an MRI is positive for osteomyelitis of distal fibula, talus, calcaneus, cuboid which is new since his last MRI 2 months ago. Orthopedic Dr. Sharol Given consulted, BKA, tolerated very well Resting right ABI indicates noncompressible arteries. Patient will continue vancomycin with HD for 2 weeks post BKA.  ID signed off  2. ESRD on HD. Anemia of chronic kidney disease Appreciate nephrology assistance. Hemodialysis on TTS. Hemoglobin currently stable. Management of anemia per nephrology.  3.  HIV. CD4 count dropped from 300-200. Current recommendation is to continue Biktarvy.  4. Chronic hypotension. Patient is on midodrine which I will continue. Blood pressure currently high 90s SBP. Asymptomatic.  Monitor.  5. Type 2 Diabetes Mellitus, uncontroled with renal and neuropathic complication last hemoglobin A1c was 9.0 in 06/18/2018. On insulin sliding scale sensitive with Basal insulin lantus.  Blood sugars have been running higher for last few days. Clearly the patient is not receiving 5 twice daily of Lantus for various reason. I will initiate the patient on 10 daily Lantus.  6. Increased nutrient needs related to wound healing Body mass index is 20.8 kg/m.  Nutrition Problem: Increased nutrient needs Etiology: wound healing Interventions: Interventions: Prostat  Pressure Injury 08/21/16 (Active)  08/21/16 2020  Location: Heel  Location Orientation: Right  Staging:   Wound Description (Comments):   Present on Admission:      Diet: renal diet DVT Prophylaxis: subcutaneous Heparin  Advance goals of care discussion: full code  Family Communication: no family was present at bedside, at the time of interview.  Discussed with brother on the phone.  Disposition:  Discharge to be determined.  Consultants: Infectious disease, orthopedic Dr. Sharol Given, nephrology Procedures: ABI, BKA  Scheduled Meds: . bictegravir-emtricitabine-tenofovir AF  1 tablet Oral Daily  . Chlorhexidine Gluconate Cloth  6 each Topical Q0600  . darbepoetin (ARANESP) injection - DIALYSIS  200 mcg Intravenous Q Tue-HD  . docusate sodium  100 mg Oral BID  . doxercalciferol  2 mcg Intravenous Q T,Th,Sa-HD  . feeding supplement (PRO-STAT SUGAR FREE 64)  30 mL Oral BID  . heparin  5,000 Units Subcutaneous Q8H  . insulin aspart  0-5 Units Subcutaneous QHS  . insulin aspart  0-9 Units Subcutaneous TID WC  . [START ON 09/07/2018] insulin glargine  10 Units Subcutaneous Daily  . lidocaine (PF)  10 mL Infiltration Once  . midodrine  10  mg Oral Once per day on Tue Thu Sat  . multivitamin  1 tablet Oral QHS  . vancomycin  500 mg Intravenous Q T,Th,Sa-HD   Continuous Infusions: . sodium chloride    .  methocarbamol (ROBAXIN) IV     PRN Meds: acetaminophen, bisacodyl, HYDROcodone-acetaminophen, HYDROmorphone (DILAUDID) injection, methocarbamol **OR** methocarbamol (ROBAXIN) IV, metoCLOPramide **OR** metoCLOPramide (REGLAN) injection, ondansetron **OR** ondansetron (ZOFRAN) IV, oxyCODONE, oxyCODONE, polyethylene glycol Antibiotics: Anti-infectives (From admission, onward)   Start     Dose/Rate Route Frequency Ordered Stop   09/05/18 0747  vancomycin (VANCOCIN) 500-5 MG/100ML-% IVPB    Note to Pharmacy:  Anthonette Legato, Melisa   : cabinet override      09/05/18 0747 09/05/18 1020   09/04/18 0600  ceFAZolin (ANCEF) IVPB 2g/100 mL premix     2 g 200 mL/hr over 30 Minutes Intravenous To Short Stay 09/04/18 0250 09/04/18 1330   09/01/18 1200  vancomycin (VANCOCIN) IVPB 500 mg/100 ml premix     500 mg 100 mL/hr over 60 Minutes Intravenous Every T-Th-Sa (Hemodialysis) 08/30/18 0914 09/18/18 2359   08/30/18 1000  bictegravir-emtricitabine-tenofovir AF (BIKTARVY) 50-200-25 MG per tablet 1 tablet     1 tablet Oral Daily 08/29/18 2353     08/30/18 0600  cefTRIAXone (ROCEPHIN) 2 g in sodium chloride 0.9 % 100 mL IVPB  Status:  Discontinued     2 g 200 mL/hr over 30 Minutes Intravenous Every 24 hours 08/29/18 2358 08/30/18 1829   08/30/18 0600  metroNIDAZOLE (FLAGYL) IVPB 500 mg  Status:  Discontinued     500 mg 100 mL/hr over 60 Minutes Intravenous Every 8 hours 08/29/18 2358 08/30/18 1829   08/29/18 2315  vancomycin (VANCOCIN) 1,250 mg in sodium chloride 0.9 % 250 mL IVPB     1,250 mg 166.7 mL/hr over 90 Minutes Intravenous  Once 08/29/18 2303 08/30/18 0206   08/29/18 2315  ampicillin-sulbactam (UNASYN) 1.5 g in sodium chloride 0.9 % 100 mL IVPB  Status:  Discontinued     1.5 g 200 mL/hr over 30 Minutes Intravenous  Once 08/29/18 2303 08/29/18 2304   08/29/18 2315  piperacillin-tazobactam (ZOSYN) IVPB 2.25 g  Status:  Discontinued     2.25 g 100 mL/hr over 30 Minutes Intravenous  Once 08/29/18 2304  08/29/18 2307   08/29/18 2315  piperacillin-tazobactam (ZOSYN) IVPB 3.375 g     3.375 g 100 mL/hr over 30 Minutes Intravenous  Once 08/29/18 2307 08/30/18 0022       Objective: Physical Exam: Vitals:   09/06/18 0517 09/06/18 1007 09/06/18 1022 09/06/18 1644  BP: 100/68 (!) 81/51 (!) 86/54 109/66  Pulse: 78 79  86  Resp: 17 18  17   Temp: 98.1 F (36.7 C) 98.3 F (36.8 C)  98.4 F (36.9 C)  TempSrc: Oral Oral  Oral  SpO2: 100% 100%  100%  Weight:      Height:        Intake/Output Summary (Last 24 hours) at 09/06/2018 1911 Last data filed at 09/06/2018 1800 Gross per 24 hour  Intake 980 ml  Output 0 ml  Net 980 ml   Filed Weights   09/05/18 1118 09/05/18 2228 09/06/18 0114  Weight: 49.5 kg 48.3 kg 48.3 kg    Data Reviewed: CBC: Recent Labs  Lab 08/31/18 0605 09/01/18 0434 09/03/18 0543 09/05/18 0738  WBC 9.2 10.9* 7.7 4.6  NEUTROABS  --   --  5.1 3.3  HGB 7.8* 7.3* 8.8* 8.5*  HCT 25.0* 24.0* 28.0* 27.7*  MCV 94.7 93.8 94.0  95.8  PLT 204 205 260 761   Basic Metabolic Panel: Recent Labs  Lab 08/31/18 0605 09/01/18 0434 09/03/18 0543 09/04/18 0713 09/05/18 0737  NA 134* 135 133* 136 133*  K 4.0 4.0 3.7 3.9 3.3*  CL 99 98 95* 96* 96*  CO2 22 21* 25 27 25   GLUCOSE 151* 125* 220* 146* 251*  BUN 54* 67* 43* 19 25*  CREATININE 9.89* 11.70* 8.13* 5.30* 5.11*  CALCIUM 8.8* 9.3 8.6* 8.6* 7.7*  PHOS  --   --   --   --  3.3    Liver Function Tests: Recent Labs  Lab 09/05/18 0737  ALBUMIN 1.8*   No results for input(s): LIPASE, AMYLASE in the last 168 hours. No results for input(s): AMMONIA in the last 168 hours. Coagulation Profile: No results for input(s): INR, PROTIME in the last 168 hours. Cardiac Enzymes: No results for input(s): CKTOTAL, CKMB, CKMBINDEX, TROPONINI in the last 168 hours. BNP (last 3 results) No results for input(s): PROBNP in the last 8760 hours. CBG: Recent Labs  Lab 09/05/18 2229 09/06/18 0318 09/06/18 0714 09/06/18 1127  09/06/18 1642  GLUCAP 160* 234* 206* 179* 344*   Studies: No results found.   Time spent: 35 minutes  Author: Berle Mull, MD [facility] 09/06/2018 7:11 PM  To reach On-call, see care teams to locate the attending and reach out to them via [email or web address]. If 7PM-7AM, please contact night-coverage If you still have difficulty reaching the attending provider, please page the [facility] (Director on Call) for [facility] on amion for assistance.

## 2018-09-06 NOTE — Evaluation (Addendum)
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 Pt PMHx: L BKA, T2DM, HTN, anemia, ESRD, hypotension   Clinical Impression   Pt PTA: recently received prosthetic for LLE BKA x1 mos ambulating with RW with assist from friend for ADL and IADL needs. Pt currently limited by poor activity tolerance, increased weakness and decreased ability to care for self. Pt completes UB ADL with modified independence and requires minA for LB ADL. Pt transfers via anterior posterior or lateral scoots from bed <-> recliner. Pt does not have 24/7 care, unable to properly care for self and would really benefit from continued OT skilled services for ADL, mobility and safety in CIR setting. OT to follow acutely.   Video interpreter used.   Follow Up Recommendations  CIR;Supervision - Intermittent    Equipment Recommendations  Tub/shower bench;3 in 1 bedside commode(drop arm)    Recommendations for Other Services Rehab consult     Precautions / Restrictions Precautions Precautions: Fall;Other (comment) Precaution Comments: wound vac Restrictions Weight Bearing Restrictions: Yes RLE Weight Bearing: Non weight bearing      Mobility Bed Mobility Overal bed mobility: Modified Independent             General bed mobility comments: no needs identified  Transfers Overall transfer level: Needs assistance Equipment used: None Transfers: Garment/textile technologist transfers: Min assist  Lateral/Scoot Transfers: Min assist General transfer comment: Pt requiring stabilization and strong use of his BUEs to assist with transfer    Balance Overall balance assessment: Needs assistance   Sitting balance-Leahy Scale: Good Sitting balance - Comments: lifting BLEs for movement with  no LOB epidoes                                   ADL either performed or assessed with clinical judgement   ADL Overall ADL's : Needs assistance/impaired Eating/Feeding: Modified independent;Sitting   Grooming: Modified independent;Sitting   Upper Body Bathing: Modified independent;Sitting   Lower Body Bathing: Minimal assistance;Sitting/lateral leans   Upper Body Dressing : Modified independent;Sitting   Lower Body Dressing: Minimal assistance;Sitting/lateral leans   Toilet Transfer: Minimal assistance;BSC(Anterior/posterior transfer only)   Toileting- Clothing Manipulation and Hygiene: Minimal assistance;Sitting/lateral lean       Functional mobility during ADLs: Minimal assistance General ADL Comments: UB ADL modified independence; pt simulating bed pan placing under himself; pt aware that he can call staff about doing an anterior posterior transfer to [facility] or recliner with assist from staff. MinA for LB needs for safety.     Vision Baseline Vision/History: No visual deficits Vision Assessment?: No apparent visual deficits     Perception     Praxis      Pertinent Vitals/Pain Pain Assessment: Faces Faces Pain Scale: Hurts little more Pain Location:  R BKA Pain Descriptors / Indicators: Discomfort Pain Intervention(s): Limited activity within patient's tolerance;Monitored during session     Hand Dominance Right   Extremity/Trunk Assessment Upper Extremity Assessment Upper Extremity Assessment: Generalized weakness   Lower Extremity Assessment Lower Extremity Assessment: RLE deficits/detail;LLE deficits/detail RLE Deficits / Details: s/p BKA LLE Deficits / Details: s/p BKA   Cervical / Trunk Assessment Cervical / Trunk Assessment: Normal   Communication Communication Communication: Prefers language other than English(spanish)  Cognition Arousal/Alertness: Awake/alert Behavior During Therapy: WFL for tasks assessed/performed Overall  Cognitive Status: Within Functional Limits for tasks assessed                                     General Comments  Pt has caregiver that can assist as needed, but pt does not have any DME to assist with mobility. Pt asking for power wheelchair as his transportation does not always park close to his apartment and he has long hallways to go up/down; pt plans to have friend assist, but has 12" curb to go up/down to parking lot.    Exercises     Shoulder Instructions      Home Living Family/patient expects to be discharged to:: Private residence Living Arrangements: Alone   Type of Home: Apartment       Home Layout: One level     Bathroom Shower/Tub: Teacher, early years/pre: Standard     Home Equipment: None          Prior Functioning/Environment Level of Independence: Independent with assistive device(s)        Comments: was using RW not does not have it anymore; " I have a friend that assists with transportation, groceries and everyday needs, but she is unable to come in here." Pt reports that he was hopping with RW and using prosthetic x1 month before this admission.        OT Problem List: Decreased strength;Decreased activity tolerance;Impaired balance (sitting and/or standing);Decreased safety awareness;Pain      OT Treatment/Interventions: Self-care/ADL training;Therapeutic exercise;Neuromuscular education;Energy conservation;Therapeutic activities;Patient/family education;Balance training    OT Goals(Current goals can be found in the care plan section) Acute Rehab OT Goals Patient Stated Goal: to go home OT Goal Formulation: With patient Time For Goal Achievement: 09/20/18 Potential to Achieve Goals: Good ADL Goals Pt Will Perform Grooming: Independently;sitting Pt Will Perform Lower Body Bathing: with modified independence;sitting/lateral leans Pt Will Transfer to Toilet: with modified independence;anterior/posterior  transfer;bedside commode Pt Will Perform Toileting - Clothing Manipulation and hygiene: with modified independence;sitting/lateral leans Additional ADL Goal #1: Pt will improve with ADLs to modified independence with least restrictive devices. Additional ADL Goal #2: pt will tolerate w/c mobility for a community distance as his apartment has long hallways with modified independence.  OT Frequency: Min 3X/week   Barriers to D/C: Decreased caregiver support  Pt unable to provide exact info about  his volunteer caregiver as to what her hours are. Pt worried about curb at apartment if receiving a manual w.c and pt wants to go home.       Co-evaluation PT/OT/SLP Co-Evaluation/Treatment: Yes Reason for Co-Treatment: For patient/therapist safety;To address functional/ADL transfers   OT goals addressed during session: ADL's and self-care      AM-PAC OT "6 Clicks" Daily Activity     Outcome Measure Help from another person eating meals?: None Help from another person taking care of personal grooming?: None Help from another person toileting, which includes using toliet, bedpan, or urinal?: A Little Help from another person bathing (including washing, rinsing, drying)?: A Little Help from another person to put on and taking off regular upper body clothing?: None Help from another person to put on and taking off regular lower body clothing?: A Little 6 Click Score: 21   End of Session Nurse Communication: Mobility status  Activity Tolerance: Patient tolerated treatment well Patient left: in bed;with call bell/phone  within reach;with bed alarm set  OT Visit Diagnosis: Muscle weakness (generalized) (M62.81);Pain Pain - Right/Left: Right Pain - part of body: Leg                Time: 5537-4827 OT Time Calculation (min): 39 min Charges:  OT General Charges $OT Visit: 1 Visit OT Evaluation $OT Eval Moderate Complexity: 1 Mod OT Treatments $Self Care/Home Management : 8-22 mins  Ebony Hail  Harold Hedge) Marsa Aris OTR/L Acute Rehabilitation Services Pager: [phone] Office: [place] 09/06/2018, 9:57 AM

## 2018-09-07 ENCOUNTER — Encounter: Payer: Self-pay | Admitting: Physician Assistant

## 2018-09-07 LAB — GLUCOSE, CAPILLARY
Glucose-Capillary: 161 mg/dL — ABNORMAL HIGH (ref 70–99)
Glucose-Capillary: 266 mg/dL — ABNORMAL HIGH (ref 70–99)
Glucose-Capillary: 290 mg/dL — ABNORMAL HIGH (ref 70–99)

## 2018-09-07 MED ORDER — DOCUSATE SODIUM 100 MG PO CAPS: 100.0000 mg | ORAL_CAPSULE | Freq: Two times a day (BID) | ORAL | 0 refills | Status: DC | PRN

## 2018-09-07 MED ORDER — CHLORHEXIDINE GLUCONATE CLOTH 2 % EX PADS: 6.0000 | MEDICATED_PAD | Freq: Every day | CUTANEOUS | Status: DC

## 2018-09-07 MED ORDER — HYDROCODONE-ACETAMINOPHEN 5-325 MG PO TABS: 1.0000 | ORAL_TABLET | Freq: Four times a day (QID) | ORAL | 0 refills | Status: AC | PRN

## 2018-09-07 MED ORDER — MIDODRINE HCL 10 MG PO TABS: 10.0000 mg | ORAL_TABLET | ORAL | 0 refills | Status: DC

## 2018-09-07 MED ORDER — NEPRO/CARBSTEADY PO LIQD: 237.0000 mL | Freq: Two times a day (BID) | ORAL | 0 refills | Status: DC

## 2018-09-07 MED ORDER — PRO-STAT SUGAR FREE PO LIQD: 30.0000 mL | Freq: Two times a day (BID) | ORAL | 0 refills | Status: DC

## 2018-09-07 MED ORDER — VANCOMYCIN IV (FOR PTA / DISCHARGE USE ONLY): 500.0000 mg | INTRAVENOUS | 0 refills | Status: AC

## 2018-09-07 MED ORDER — VANCOMYCIN HCL IN DEXTROSE 500-5 MG/100ML-% IV SOLN: 500.0000 mg | INTRAVENOUS | Status: DC

## 2018-09-07 NOTE — Progress Notes (Signed)
DISCHARGE NOTE HOME Alvin Daniels to be discharged Home per MD order. Discussed prescriptions and follow up appointments with the patient. Prescriptions given to patient; medication list explained in detail. Patient verbalized understanding.  Skin clean, dry and intact without evidence of skin break down, no evidence of skin tears noted. IV catheter discontinued intact. Site without signs and symptoms of complications. Dressing and pressure applied. Pt denies pain at the site currently. No complaints noted.  Patient DC with PREV  An After Visit Summary (AVS) was printed and given to the patient. Patient escorted via wheelchair, and discharged home via private auto.  Paulla Fore, RN

## 2018-09-07 NOTE — Progress Notes (Signed)
Physical Therapy Note  Patient suffers from R BKA (with previous L BKA) which impairs their ability to perform daily activities like walking, general mobility, bathing, dressing, grooming, hygeine and general self-care in the home.  A walker alone will not resolve the issues with performing activities of daily living. A wheelchair will allow patient to safely perform daily activities.  The patient can self propel in the home or has a caregiver who can provide assistance.     Alvin Daniels, [place]  Acute Rehabilitation Services Pager [phone] Office [phone]

## 2018-09-07 NOTE — Progress Notes (Addendum)
Subjective: 3 Days Post-Op Procedure(s) (LRB): RIGHT BELOW KNEE AMPUTATION (Right) Patient reports pain as mild.    Objective: Vital signs in last 24 hours: Temp:  [98.1 F (36.7 C)-98.5 F (36.9 C)] 98.1 F (36.7 C) (04/27 0551) Pulse Rate:  [79-86] 80 (04/27 0551) Resp:  [17-18] 18 (04/27 0551) BP: (81-127)/(51-76) 107/70 (04/27 0551) SpO2:  [99 %-100 %] 100 % (04/27 0551) Weight:  [48.3 kg] 48.3 kg (04/26 2046)  Intake/Output from previous day: 04/26 0701 - 04/27 0700 In: 980 [P.O.:980] Out: 0  Intake/Output this shift: No intake/output data recorded.  Recent Labs    09/05/18 0738  HGB 8.5*   Recent Labs    09/05/18 0738  WBC 4.6  RBC 2.89*  HCT 27.7*  PLT 262   Recent Labs    09/05/18 0737  NA 133*  K 3.3*  CL 96*  CO2 25  BUN 25*  CREATININE 5.11*  GLUCOSE 251*  CALCIUM 7.7*   No results for input(s): LABPT, INR in the last 72 hours.  The right transtibial amputation site with VAC in place and functioning well. No drainage in VAC canister.    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  Follow up in the office 1 week following DC.     Erlinda Hong, PA-C 09/07/2018, [place]

## 2018-09-07 NOTE — Discharge Instructions (Signed)
El Sodio y su Dieta con la [place] Que es el sodio? Sodio es un mineral que se encuentra naturalmente en comidas y conforma la mayor parte de la sal de mesa. Cules son los efectos de comer demasiado sodio? Cuando sus riones no estn saludables se acumula sodio y liquido en su cuerpo. Esto causa que sus tobillos se hinchen, se inflamen, su presin sangunea aumente, su respiracin se disminuya, y/o el lquido se acumule alrededor de su corazn y pulmones. Vea la siguiente tabla para sugerencia en como reducir el sodio en su dieta. Limitar su porcin de Alimento que limitar por su alto contenido de sodio Substitutos aceptables.  Sal & condimentos con sal  Sal de mesa  Sal de aderezo  Sal de ajo  Sal de cebolla  Sal de apio  Pimienta con limn  Ablandador de la carne  Cubos de [place]  Reforzadores del sabor Ajo fresco, cebolla fresca, polvo de [place], polvo de [place], pimienta negra, jugo de limn, sazonadores [place] o bajo en sodio a sal, vinagre.  Alimentos salados [employer] altas en sodio como:   Salsa de barbacoa  Salsa de filete  Salsa de teriyaki  Salsa de la ostra  Salsa de espagueti Bocadillos salados como:  Galletas saladas  Virutas de patata  Virutas de maz  Prsteles  Virutas de tortillas  Palomitas  Semillas de girasol Salsas y aderezos bajos en sodio hechos en casa, vinagre, mostaza seca, palomitas, prsteles, virutas de maz y tortilla sin sal.  Alimentos curados  Jamn  Puerco en sal  [place]  Salmuera/condimento salmuera  Arenques  Olivos Carne de res, [place], pescado, aves de corral y [employer]  Carnes de Biomedical scientist  Perros calientes  Yahoo de el Deli de los cortes fros  Pastrami  Salchicha  Carne en lata  Spam Alimentas bajos en sal de el deli  Alimentos procesados  Leche de crema  Queso Enlatadas:  Sopas  Productos de tomate  Jugos de vegetales  Vegetales enlatados Comidas de  conveniencia como:  Cenas de TV  Raviolis conservados  El chili  Macarrones y el queso  Espagueti  Mezclas comerciales  Alimentos congelados  Comidas rpidas Queso natural(1-2 Oz)  Alimentos preparados en casa bajos en sodio, comidas enlatadas que no tengan sal aadida.  Caceroladas hechas en casa sin sal, hechas con vegetales frescos o sin cocer, carnes frescas, arroz, pasta o vegetales enlatados sin sal.  Por qu necesito limitar mi producto del sodio? Cierto sodio y sal es necesario para el balance de agua en nuestro cuerpo. Pero cuando los riones pierden la habilidad de Statistician sodio y agua en nuestro cuerpo podramos experimentar los siguientes sntomas:  Sed  Acumulacin de lquidos  Presin sangunea alta Con usar menos sodio en su dieta usted podr Software engineer. Indirectas para guardar su llanura de producto de sodio.  Cocine con hierbas y especies en [place] etiquetas y escoja la comida con menos sal.  Evite los substitutos de sal y comidas bajas en sodio con substitutos de sal. Ya que estos substitutos son altos en sodio.  Cuando coman afuera, pregunte por carne o pescado sin sal. Pregunte por la salsa o aderezos separados, pues estos contienen altos porcentajes se sal las cuales se deben usar en proporciones pequeas.  Limite el uso de alimentos enlatados, procesados o congelados. Informacin para leer las etiquetas adecuadamente.  Entendiendo los trminos:  o Libre de sodio: solamente una cantidad trivial de  sodio por porcin. o Sodio muy bajo: 35 MG o menos por porcin. o Bajo en sodio: 140 MG por porcin. o Reducido en sodio: comidas en las cuales el sodio es reducido por 25%. o [place] en sodio: comidas a las cuales relucieron el sodio por 50%.  Regla simple: si la sal es uno de los primeros 5 ingredientes el alimento esta alto en sodio. Todas las etiquetas ahora tienen miligramos (MG) de sodio. Siga estos  pasos cuando lea la informacin de sodio en la etiqueta. 1. prenda cuanto sodio usted puede consumir diariamente: recuerde que hay 1,000 MG en un GM. Por ejemplo, si su dieta indica 2 GM. De sodio su lmite son 2,000 MG por da. Convide Librarian, academic del sodio en las comidas que consuma durante el da. 2. Vea la etiqueta de el paquete: vea el tamao se la porcin, los valores nutricionales por porcin. Cmo se comparan con su dieta permitida diariamente? Si el nivel des sodio son 500MG . O ms por porcin ese alimento no es buena idea. 3. compare etiquetas de productos similares: seleccione el que tenga la concentracin de sodio mas baja por porcin. Como darle sabor a sus comidas. El eliminar la sal no quiere decir que tenga que Dealer. Aprenda a sazonar sus alimentos con hierbas y especies, sea creativa y experimente con sabores nuevos y excitantes. Qu tipo de hierbas y especies debo de aadir en vez de sal para dar sabor? Trate las siguientes especies con las comidas indicadas: Pimienta inglesa: Use con carne de res, puerco y con la mayora de los vegetales. [place]: Use con carne de res, puerco, y la [employer] Hotels. [place]car[place]: Use con carne de res, puerco, ejotes, coliflor, repollo, remolachas, esprrago y en marinadas. Hoja de laurel: Use con carne de res, puerco, y la mayora de los vegetales. [place]: Use con carne de res, pollo, pescado, ejotes, zanahorias y [place]. Eneldo: Use con carne de res, pollo, ejotes, repollo y guisantes. Jengibre: Use con carne de res, pollo, puerco, ejotes, coliflor y berenjena. Mejorana: Use con carne de res, pollo, puerco, ejotes, coliflor y berenjena. Romero: Use con pollo, puerco, coliflor, guisantes y [place]. Tomillo: Use con carne de res, pollo, puerco, pescado, ejotes, berenjenas y zanahorias. Sabio: Use con carne de pollo, puerco, berenjena y aderezos. Estragon: Use con carne de pescado, pollo, esprragos, berenjena,  repollo, coliflor y [place]. Sugerencias para cocinar con hierbas y especies.  Compre especies e hierbas en cantidades pequeas. Cuando se quedan almacenadas por anos pierden su sabor.  No use ms de un cuarto de CDT. de especies secas (3/4 CDT fresca) por cada libra de carne.  Aada especies de tierra a la comida 15 minutos antes de que termine de cocinarse.  Aada especies enteras a la comida 1 hora antes de que termine de cocinarla.  Combine hierbas con aceite o mantequilla. Djelo reposar por 30 minutos para que su sabor salga, despus con una brocha pngalo en su comida mientras lo cocina o pngalo una hora antes de empezar a cocinar.  Machuque las hierbas secas antes de aadirlas a las comidas. Puedo usar substitutos de sal? [place]! Si le han dicho que limite el potasio en su dieta, sea muy cauteloso ya que la [place] de estos contiene [place] forma u otra de [place]. Consulte con su doctor o enfermera antes de usar substitutos de sal. Aqu hay algunas recetas para crear algunos sazonadores: Mezcla preferida americana 5cdt de polvo de cebolla 1cds de polvo de ajo 1cds de pimienta  hngara 1cds de mostaza seca 1cdtde tomillo 1/2cdt de pimienta blanca 1/2cdt de semillas de apio Completan 1/4de vaso Condimento sabroso 11/4cdt de semillas de apio 2cdt de mejorana machucada 2cds de sabrosa machacada 2cds de tomillo machacado 1cdt de [place] Completa 1/2 vaso    Condimento picoso 3cds de semilla de apio 1cds de polvo de cebolla 1cdt de polvo de ajo 2cds de organo machacado 1cds de tomillo machacado 1 1/2cdt de hojas de laurel machacadas 11/2cdt de pimienta negra 11/2cdt de clavos de tierra Completa 1/2 vaso Mezcla sabrosa de condimento sabroso 1cdt de [place] de polvo 2cdt de organo machacado 2cdt de pimienta negra 1cdt de polvo de ajo 6cdt de polvo de cebolla 2cdt de mostaza seca 3cdt de pimienta hngara Completa 3/4 vaso  Invente y experimente su propios  condimentos y sazonadores con hierbas y especies que usted prefiera. Haga clic aqu para obtener ms informacin sobre el sodio Revisado y traducido por Marlou Starks / revised and translated by Marlou Starks. If you would like more information, please contact us.  2015 [employer]. All rights reserved. This material does not constitute medical advice. It is intended for informational purposes only. Please consult a physician for specific treatment recommendations. El Fsforo y su [place] es el fsforo? El fsforo es un mineral que se encuentra en los [place]. Junto con el calcio, el fsforo es necesario para el desarrollo de huesos fuertes y saludables, as como para Theatre manager sanas otras partes del cuerpo. Por qu es importante el fsforo para usted? Los riones que funcionan normalmente pueden eliminar el fsforo extra de [employer]. Cuando usted tiene Insuficiencia Renal Crnica (IRC) sus riones no pueden Art therapist. Las concentraciones altas de fsforo puede daar su cuerpo. Una concentracin extra de calcio causa cambios en el cuerpo, los cuales liberan calcio de sus huesos debilitndolos. Las Geneticist, molecular de fsforo y calcio tambin producen depsitos peligrosos de calcio en los vasos sanguneos, los pulmones, los ojos y Film/video editor. El control del fsforo y el calcio es muy importante para su salud general. Cul es una concentracin segura de fsforo en la sangre? Una concentracin normal de fsforo es de 2.5 a 4.5 mg/dl. Pregntele a su mdico y a su dietista cul fue la ltima concentracin de fsforo en sus anlisis y antela aqu _______________________. Me ayudar el tratamiento de dilisis a controlar el fsforo? S. El tratamiento de dilisis puede eliminar parte del fsforo de su sangre. Es importante que entienda cmo limitar la acumulacin de fsforo entre sus tratamientos de dilisis. Cmo puedo controlar mi  concentracin de fsforo? Puede mantener su concentracin de fsforo normal entendiendo su dietas y sus medicamentos para el control del fsforo. Su dietista y su mdico le ayudarn con esto. A continuacin damos una lista de alimentos con alto contenido de fsforo. ALIMENTOS CON ALTO CONTENIDO DE FSFORO QUE SE DEBEN LIMITAR O EVITAR  Bebidas cerveza  bebidas con chocolate cocoa  bebidas preparadas con leche  t helado en lata  colas oscuras  Productos lcteos queso queso cottage  natillas helado  leche pudding  sopas crema yogurt  Protenas carpa cangrejo de ro  hgado de vaca hgado de pollo  huevas de pescado menudos  ostras sardinas  Verduras frijoles y guisantes secos:  frijoles preparados con salsa de tomate frijoles negros  garbanzos  frijoles con forma de rin lentejas  frijones blancos  frijoles con cerdo y chcharos  soja  Otros alimentos cereales de  salvado levadura de cerveza  caramelos nueces  semillas germen de trigo  productos integrales Qu son los medicamentos para controlar el fsforo? Su mdico puede ordenar un medicamento llamado fijador de fsforo para que lo tome con las comidas y [employer]. Este medicamento ayudar a Aeronautical engineer cantidad de fsforo que absorbe su cuerpo a Proofreader de los alimentos que usted ingiere. Hay muchas clases distintas de fijadores de fosfato. Puede obtenerse en pldoras, como tabletas masticables y en polvo. Algunos tipos tambin contienen calcio y otros no. Slo tome el fijador de fosfato que le ordene su mdico o dietista. Anote aqu el nombre de su fijador de fosfato: ________________________. Instrucciones: ________________________. Qu debo hacer si mi concentracin de fsforo es demasiado alta? Si su concentracin de fsforo es demasiado alta, piense en su dieta y por un tiempo, sustityala con alimentos con menor contenido de fsforo. Hable con su dietista y su mdico sobre hacer cambios en su  dieta y pregnteles sobre su receta para el fijador de fsforo. ALIMENTOS CON ALTO CONTENIDO DE FSFORO EN LUGAR DE ALIMENTOS CON BAJO CONTENIDO DE FSFORO PRUEBE  Fsforo (mg) Fsforo (mg)  8 onzas de leche 230 8 onzas de leche no lctea o  4 onzas de [place]  100  115  8 onzas de sopa crema preparada con leche 275 8 onzas de sopa crema preparada con agua 90  1 onza de queso duro 145 1 onza de queso crema 30   taza de helado 80  taza de sorbete o 1 paleta helada 0  1 lata de cola de 12 onzas 55 1 lata de Ginger Ale o soda limn de 12 onzas 3   taza de frijoles blancos o pinto 100  taza de verduras mixtas o ejotes 35   taza de natillas o pudding preparado con leche 150  taza de pudding o natillas preparados con leche no lctea 50  2 onzas de cacahuates 200 1  tazas de palomitas de maz con poca sal y poca grasa 35  1  onzas de chocolate 125 1  onzas de caramelos duros de fruta o caramelos de goma 3  2/3 taza de avena 130 2/3 taza de crema de trigo o smola de maz  40   taza de salvado 140-260  taza de cereal no salvado, trigo, cereal de arroz o copos de maz 50-100 Ms de 20 millones de estadounidenses --uno de cada nueve adultos-- sufren insuficiencia renal crnica y Media planner ni siquiera lo sabe. Ms de otros 20 millones tienen un mayor riesgo de Lawyer enfermedad. [place], una importante organizacin voluntaria de [place], tiene como objetivo prevenir las enfermedades renales y del tracto urinario, Teacher, English as a foreign language la salud y Musician de las personas y las familias que estn afectadas por estas enfermedades, e incrementar la disponibilidad de todos los rganos para trasplante. A travs de sus 50 afiliadas en todo el pas, la fundacin conduce programas en investigacin, educacin profesional, servicios para pacientes y la comunidad, educacin pblica y donacin de rganos. [place] del Rin es financiado por  donaciones pblicas. Haga clic aqu para obtener ms informacin sobre el fsforo [place] del Rin desea agradecer al [place] [employer] Life Nutricin Renal por el desarrollo de esta hoja informativa. If you would like more information, please contact us.  2015 [employer]. All rights reserved. This material does not constitute medical advice. It is intended for informational purposes only. Please consult a physician for specific treatment  recommendations. El Potasio y Su Dieta [place] es el potasio y por qu es importante para usted? El potasio es un mineral que se encuentra en muchos de los alimentos que usted ingiere. Ayuda a Theatre manager regular su latido cardaco y al correcto funcionamiento de los msculos. Una de las funciones de los riones sanos es Theatre manager la cantidad [place] de potasio en el cuerpo. Sin embargo, cuando sus riones no estn sanos, es comn que necesite limitar ciertos alimentos que pueden incrementar el potasio en su sangre a una concentracin peligrosa. Si su concentracin de potasio es alta, es posible que sienta debilidad, adormecimiento y hormigueo. Si su concentracin de potasio baja demasiado, puede causar un latido cardiaco irregular o un ataque cardiaco. Qu nivel de potasio debo tener en la sangre? Pregntele a su medico o su dietista cul es su nivel mensual de potasio y antelo aqu: Si es 3.5 a 5.0  Usted est en la zona SEGURA Si es 5.1 a 6.0  Usted est en la zona de PRECAUCIN Si es mayor de 6.0  Usted est en la zona de PELIGRO Cmo puedo ayudar a que mi nivel de potasio no se eleve demasiado? Deber limitar los alimentos con alto contenido de [place]. Su dietista renal le ayudar a planear su dieta para que [place] la cantidad [place]. Ingiera una variedad de alimentos pero con moderacin. Si desea incluir algunas verduras con alto contenido de potasio, fltrelas antes de usarlas. El  filtrado es un proceso por el cual se puede extraer parte del potasio de las verduras. Al final de esta hoja informativa, puede encontrar instrucciones para filtrar ciertas verduras ricas en potasio. Hable con su dietista sobre la cantidad de verduras filtradas ricas en potasio que puede incluir en su dieta. No beba ni use el lquido de frutas y verduras en lata, ni los jugos de la carne cocida. Recuerde que casi todos los alimentos contienen algo de [place]. El tamao de la porcin es muy importante. Una gran cantidad de alimentos con bajo contenido de potasio puede convertirse en un alimento con alto contenido de potasio. Si est bajo tratamiento de dilisis, asegrese de Field seismologist todo el tratamiento o los intercambios que se le indicaron. Qu alimentos tienen alto contenido de potasio (ms de 200 miligramos por porcin)? La tabla siguiente lista los alimentos que son ricos en potasio. A menos que se indique lo contrario, el tamao de la porcin es  taza. Asegrese de fijarse en los tamaos de las porciones. Si bien todos los [employer] lista tienen un alto contenido de potasio, en algunos el contenido es mayor que en otros. Alimentos con alto contenido de potasio  Frutas Verduras Otros alimentos  Damascos, crudos (2 medianos) secos (5 mitades) [place] bellota Salvado/Productos de salvado  Aguacate (1/4) [place] Chocolate (1.5 a 2 onzas)  Banana () Brotes de bamb Granola  Meln cantalupo [place] Leche, todos los tipos (1 taza)  Dtiles (5) Butternut Squash Melaza (1 cucharada)  Lambert Mody secas Frijoles refritos Suplementos alimenticios:   Slo selos bajo la   direccin de su mdico   o dietista.  Higos, secos Remolachas, hervidas   Jugo de toronja Frijoles negros   Meln honeydew Brcoli, cocido Nueces y semillas (1 onza)  Kiwi (1 mediano) Repollos de Advertising copywriter de cacahuate (2 cucharadas)  Mango (1 mediano) Repollo chino Sustitutos de sal/Lite [employer]ysRus (1 mediana)  Zanahorias, crudas Caldo sin sal  Naranja (1 mediana) Frijoles y guisantes secos Yogur  Jugo  de naranja Verduras de Boeing, excepto col rizada tabaco/tabaco de Higher education careers adviser  Papaya (1/2) [place]    [place] (1) [place]    Jugo de [place]    Ciruelas secas Legumbres    Jugo de ciruelas secas [place]    Pasas de uva Chirivas      Papas, blancas y dulces      [place]      Nabo sueco      [place], cocida      Tomates/Productos de tomate      Jugos de verdura      Hongos blancas, cocidos (1/2 taza)    Qu alimentos tienen bajo contenido de potasio? La tabla siguiente [employer] con bajo contenido de [place]. A menos que se indique lo contrario, una porcin es 1/2 taza. [place] ms de 1 porcin puede convertir un alimento con bajo contenido de potasio en uno con alto contenido de potasio. Alimentos con bajo contenido de potasio  Frutas Verduras Otros alimentos  [place] (1 [place]) Brotes de alfalfa Arroz  Jugo de Engineer, petroleum (6) Fideos  Pur de Firefighter Ejotes o judas verdes Pastas  Damascos, enlatados en jugo Repollo, verde y [place], cocidas Pan y productos de panadera: (No integrales)  Moras Coliflor Pastel: de ngel, biscochuelo  Arndano Apio (1 tallo) Caf: lmite de 8 onzas  Teacher, adult education, fresco (1/2 espiga) congelado (1/2 taza) Pasteles sin chocolate ni frutas ricas en potasio  Arndano rojo Pepinos Galletitas sin nueces ni chocolate  Cctel de fruta Berenjena T: lmite de 16 onzas  Uvas Col rizada    Jugo de [place]    Toronja () Verduras mixtas    Mandarinas Hongos blancos, crudos (1/2 taza)    Economist, frescos (1 pequeo) en lata (1/2 taza) Scientist, physiological, frescos (1 pequeo) en lata (1/2 taza) Perejil    Pia Guisantes, verdes    Jugo de pia Chiles    Ciruelas (1) Rabanitos    Frambuesas Ruibarbo    Fresas Castaas de agua, en lata    Mandarinas (1) Berro    Sanda (lmite de 1 taza) Zapallo amarillo      Zapallitos verdes largos    Cmo le quito parte del potasio a mis verduras favoritas ricas en potasio? El proceso de filtrado ayudar a extraer potasio de algunas verduras ricas en potasio. Es importante recordar que el filtrado no extraer todo el potasio de las verduras. Usted igual debe limitar la cantidad de verduras filtradas que come. Pregntele a su dietista sobre la cantidad de verduras filtradas que pude comer en forma segura. [place] verduras. Para las papas, los boniatos, las zanahorias, las remolachas y los nabos suecos: Pele y coloque la verdura en agua fra para que no se oscurezca. Corte la verdura en rodajas de 1/8 pulgada de espesor. Enjuguela en agua caliente por unos segundos. Remjela durante un mnimo de dos horas en agua caliente. Use una cantidad de agua que sea diez veces la cantidad de verdura. Si deja la verdura en remojo por ms tiempo, cambie el agua cada cuatro horas. Enjuguela nuevamente con agua caliente por unos segundos. Cocine la verdura usando una cantidad de agua que sea cinco veces la cantidad de verdura. Para la calabaza, los hongos, Lawyer y las verduras de hoja verde congeladas: Permita que la verdura congelada se descongele a Scientist, forensic ambiente y que Therapist, occupational. Mineral con agua caliente por unos segundos. Remjelas durante un mnimo de dos horas  en agua caliente. Use una cantidad de agua que sea diez veces la cantidad de verduras. Si deja las verduras en remojo por ms tiempo, cambie el agua cada cuatro horas. Enjuguelas nuevamente con agua caliente por unos segundos. Cocine las verduras de la forma usual, pero use una cantidad de agua que sea cinco veces la cantidad de verdura. Bibliografa: [place] Values of Portions Commonly Used, 17th Ed., [place], [place], [place], 1998. Diet Guide for Patients with Kidney Disease, Renal Interest Group- [place]. Ms de 30  millones de estadounidenses --uno de cada nueve adultos-- sufren insuficiencia renal crnica y Media planner ni siquiera lo sabe. Ms de otros 20 millones tienen un mayor riesgo de Lawyer enfermedad. [place], una importante organizacin voluntaria de [place], tiene como objetivo prevenir las enfermedades renales y del tracto urinario, Teacher, English as a foreign language la salud y Musician de las personas y las familias que estn afectadas por estas enfermedades, e incrementar la disponibilidad de todos los rganos para trasplante. A travs de sus 50 afiliadas en todo el pas, la fundacin conduce programas en investigacin, educacin profesional, servicios para pacientes y la comunidad, educacin pblica y donacin de rganos. El [place] del Rin es financiado por donaciones pblicas. Haga clic aqu para obtener ms informacin sobre el potasio La Fundacin Nacional del [place] desea agradecer al [place] [employer] Life Nutricin Renal por el desarrollo de esta hoja informativa. If you would like more information, please contact us.  2015 [employer]. All rights reserved. This material does not constitute medical advice. It is intended for informational purposes only. Please consult a physician for specific treatment recommendations. Comiendo Fuera De Casa Con Confianza  A la [employer] de las personas les agrada salir a Counselling psychologist. Esta gua brinda ideas para que su experiencia con la comida sea divertida, incluso con su dieta especial. Para comenzar, conozca bien su dieta y pdale a su dietista que le d consejos o asesoramiento. Si tiene restricciones de sodio, potasio, fsforo o protenas, este cuadernillo lo ayudar a tomar buenas decisiones, basadas en sus necesidades dietarias especficas. Planifique con anticipacin  Si va a cenar afuera, planifique el desayuno y Biomedical scientist en su casa en consecuencia. Reduzca los tamaos de las porciones y el consumo de alimentos  con gran cantidad de sodio y Risk manager. Llame con anticipacin para obtener informacin sobre el men y sobre cmo se preparan los alimentos. Explique que est siguiendo una dieta especial. Muchos restaurantes actualmente tienen sitios web en los cuales figura el men. Elija un restaurante en el que ser ms fcil seleccionar los alimentos que mejor se adapten a su dieta. Los restaurantes en los que se hacen alimentos a pedido son la mejor opcin. El personal del restaurante est acostumbrado a pedidos especiales de alimentos o mtodos de preparacin. Sin embargo, muchos solicitan que llame con al menos 24 horas de anticipacin para hacer los arreglos necesarios. Cmo elegir Mire el men cuidadosamente. Pdale al camarero ms detalles de lo que no entienda. Practique realizar pedidos especiales acerca de la manera en la que se prepara o se sirve su comida. Considere la posibilidad de compartir un plato principal con un amigo o familiar. Ejemplos de pedidos especiales Para aderezos de ensaladas, jugos de coccin o salsas: ...al [place]. Para platos principales grillados, salteados u horneados: Sin sal, por favor. Para comidas asiticas: ...sin MSG (glutamato monosdico). Para sndwiches o hamburguesas: ...sin queso o con mostaza y ketchup al  costado. Inquietudes sobre protenas Si su dieta especfica incluye restricciones de protenas, puede ser conveniente pedir medias porciones de los platos principales que contienen carne, aves, pescado o [place]. Podra compartir un plato principal con un compaero de cena. Otra opcin es llevar una parte del plato principal para comerlo en su casa en una caja para llevar. No olvide que hay protenas en las salsas de queso y crema; alimentos preparados con [place], nueces y [place]; y en platos vegetarianos que contienen frijoles secos o lentejas. Pautas para comidas o platos especficos Desayuno: El desayuno puede ser una de las comidas ms fciles para  seguir su dieta cuando come afuera. La mayora de los restaurantes ofrecen desayunos a la carta. Pautas para comidas o platos especficos Desayuno: El desayuno puede ser una de las comidas ms fciles para seguir su dieta cuando come afuera. La mayora de los restaurantes ofrecen desayunos a la carta. Desayuno  Mejores opciones Malas opciones  Huevos, cocinados a pedido Carnes o pescados curados o salados, como jamn, salchicha, salmn ahumado y tocino canadiense. Limite el tocino a 2 tiras de vez en cuando  Omelet con vegetales con poco potasio, como hongos o calabaza Omelet con queso o encima de carnes, sndwiches de comida rpida para el desayuno, burritos  Tostadas, bollos en forma de rosca, molletes ingleses, croissants, molletes comunes o de arndano [place] y molletes de salvado  Panqueques, wafles, torrejas Papas fritas o croquetas de papa caseras  Margarina, jalea, miel, canela, azcar y [place] de imitacin para panqueque [place] o jarabe de alce real  Frutas y jugos con poca cantidad de potasio, como salsa o jugo de [place] y [place], como jugo de naranja o media toronja  Actor calientes y fros Actor de salvado y avena con nueces, semillas o germen de trigo  Rosquillas, masa de frutas, pancitos dulces, torta de caf Masas con chocolate, nueces, coco o caramelo  Bebidas:   Planifique la cantidad de lquido que puede tomar [employer].  Planifique cundo desea la bebida: antes, durante o despus de la comida. Rechace las [employer] Corporation hagan en otros momentos.  Elija bebidas con bajo contenido de potasio y fsforo. (Pida pautas especficas a su dietista).  Pida que no le vuelvan a llenar el vaso o la taza. Pida bebidas en vaso en lugar de pedir Almyra Free de bebidas.  Exprima limn en el agua para ayudar a saciar la sed.  Ponga los vasos o las tazas boca abajo antes de que las llenen, o pida que se las lleven.  Aleje del plato el vaso o la taza cuando haya  terminado para evitar que los vuelvan a llenar.  Guarde su cupo de lquido a lo largo del da para poder beber ms cuando salga a Counselling psychologist. Bebidas  Mejores opciones Malas opciones*  Ccteles mezclados con agua con gas, refresco de jengibre, agua tnica o refrescos (excepto bebidas cola) Ccteles mezclados con jugo de frutas, jugo de tomate, cctel de [place], [place], crema o helado  Vino, tinto o blanco, si se tiene en cuenta el potasio (sustituto para una fruta) Cerveza  Refrescos que no sean de cola, tales como Sprite , 7UP o refrescos de naranja Cualquier bebida cola  T o caf helado [place], [place], licuados, mezclas de cacao  [place], bebida de [place], agua Bebidas similares al jugo de [place], jugo de     [place], jugo de vegetales  * Evite las bebidas con aditivos de fosfato.  Ensaladas y barras de ensaladas:  Las ensaladas a menudo son parte de la lista de [place]. Use sus frutas y vegetales permitidos para estas opciones.  Pida que el aderezo se sirva al costado, de manera que pueda controlar la cantidad. El aceite y el vinagre son siempre una buena opcin para los aderezos. Puede llevar su propio aderezo con bajo contenido de [place]. El jugo de limn o lima exprimido generosamente sobre la ensalada puede reemplazar al [employer].  Si no figura en el men, pregunte al [employer] qu vegetales o frutas tiene la ensalada. A menudo las ensaladas se preparan de manera individual, por lo que generalmente puede hacer pedidos especiales. En la siguiente tabla se muestran algunos ejemplos. Ensaladas  Mejores opciones Malas opciones  [place], repollo, remolacha, coliflor, apio, [place], [place], [place], frijoles, frijoles verdes, rabanito, col, pimiento dulce [place], tomate, palta, alcaucil, judas, garbanzos, semillas, nueces  Ensalada de pasta, ensalada de macarrones Ensalada de papas  [place] de [place] de tres frijoles  Fiji de repollo [place] griega con aceitunas, salsa  de pepinillos y condimentos en escabeche  Taza o ensalada de frutas, cctel de frutas enlatadas, duraznos o peras enlatados, pia fresca o enlatada, mandarinas Ensalada con meln, naranjas, bananas, kiwi, frutas secas, carambolas  Ensaladas de gelatina (sola o con agregado de frutas o vegetales con poco potasio)    Entradas:   Busque alimentos frescos y simples para evitar una carga excesiva de sal o lquido antes de la comida.  Una entrada con alto contenido de protenas se puede Risk manager como plato principal. Las porciones generalmente son ms pequeas y Diplomatic Services operational officer. Algunos ejemplos se muestran en la siguiente tabla, pero varan con cada tipo de restaurante. Entradas  Mejores opciones Malas opciones  [employer] con pollo o camarones Requesn, fondue de [place], otros platos con [place], anchoas  Tostadas con [place], cerdo o carne [place], bastones de queso  Tortas de cangrejo, almejas al vapor, calamares fritos, la [place] de los platos con langostinos Ostras  [place], cscaras de papa  [place] de hojas verdes con carne o pescado, o ensalada del chef sin jamn o queso (pida el aderezo al costado) Hgado picado o pat; carne, pescado o ave salados o ahumados, tales como [place], salmn o pavo [place]; sopa, caldo o consom  Otros: albndigas, alas de pollo, bastones y [place] de cazuelas (sin salsas para mojar), calabacines, hongos o aros de cebolla fritos    Platos principales:   Las porciones que se sirven en los restaurantes pueden ser mucho ms grandes que las que come en su casa. Cuando salga a comer afuera, calcule una cantidad cercana a la que normalmente come. Planifique llevarse las sobras a su casa, o divida la comida con otra persona de su grupo. CONSEJO: 85 gramos (3 onzas) de carne roja, de pescado o de ave cocinada tienen aproximadamente el tamao de una baraja de cartas. O, si pesa las porciones de carne en su casa, mdalas con la mano para usarlas como gua cuando  coma afuera de su casa.  Los alimentos grillados son buenas opciones porque puede pedir que los preparen como usted [place]. Pida que no se agregue sal a la coccin.  Pida que se sirva el jugo de coccin o la salsa al [place].  Evite los platos o cazuelas combinados, que generalmente tienen mayor contenido de sodio y fsforo. Quite la piel de las aves y las cortezas de los alimentos fritos para reducir el contenido de [place].  Es mejor NO agregar salsa de carne, salsa [place], salsa de  soja ni salsa picante debido al alto contenido de [place].  El jugo de limn o lima y el vinagre hacen buenas salsas y extraen gran parte del sabor natural de los alimentos. La pimienta negra agrega sabor a la comida sin que usted sienta sed. Platos principales  Mejores opciones Malas opciones  Carne de vaca (bifes asados o grillados, hamburguesas sin queso, filete de costilla asado o bife asado picante ), pollo (al horno, frito, grillado o asado), pata de cordero, chuletas de cordero, ternera, pan de carne Platos combinados, estofado de bife o cordero, hgado y Designer, jewellery, carnes curadas o saladas (jamn, carne en conserva, salchicha, jamn crudo, Financial trader)  Pescados o mariscos (grillados, al vapor o cocidos a fuego lento) Sopa de pescado, ostras, langostas a la [place], salmn [employer] Corporation de carne o mariscos Salsas (especialmente de queso o tomate), jugos de coccin  [place], tacos de carne o pollo (sin queso ni tomate) Platos con frijoles, frijoles de aj, aj con carne  Omelet con vegetales permitidos y la salsa servida al costado Omelet con tocino, queso, jamn, salchicha  Sndwiches (pida sin queso): bife asado, pollo, huevo, pavo, bife asado o [employer], sndwich de mariscos frescos Sndwich submarino, queso [place], sndwich de [place], [place] y [place], [place] de tocino, sndwich [place], ensalada de atn (enlatado)  Guarniciones: Cuanto ms conozca su dieta, ms Scientist, water quality los  alimentos adecuados. Sera prudente que revise sus listas de alimentos antes de salir a Counselling psychologist.  Elija almidones y vegetales que tengan menos contenido de potasio, tales como arroz, fideos y frijoles verdes.  Si su comida no incluye una buena opcin para su dieta, pida un sustituto en el men.  Pida que se omitan las salsas o se sirvan al costado.  Para aumentar sus opciones, evite las frutas y los vegetales con mayor contenido de potasio durante el da antes de salir a Counselling psychologist. Guarniciones  Mejores opciones Malas opciones  Fideos o pastas, pastas con pesto, ensalada de [employer] con salsa de tomate  Arroz al vapor, plato de arroz, arroz con arvejas ames, batatas, arroz frito, papas blancas  Bastones de pan, panes o pancitos sin sal Frijoles al horno o la parrilla, frijoles refritos  Vegetales con poca cantidad de potasio, como esprragos, repollo, zanahorias cocidas, maz, berenjena, frijoles verdes, calabacines, mazorca de maz, ensalada de repollo, ensalada de [employer] Laboratories con mayor cantidad de potasio, como tomates, espinaca, coles, alcauciles, calabaza bellota y [employer] Corporation con alto contenido de sodio como chucrut, vegetales en escabeche  Consejo: Recuerde tomar su aglutinante de fosfato (tambin llamado aglutinante de fsforo) con la comida. Asegrese de llevarlo con usted, y tenga un poco en el auto para tenerlo siempre a su alcance. Postres:  Pida una clara descripcin del postre. Elija los postres que tienen preparaciones simples para evitar que haya fsforo y potasio ocultos.  Las opciones con chocolate, queso crema, helado o nueces tendrn mucho ms potasio y fsforo. Evtelos, comprtalos con un amigo, o simplemente coma una pequea cantidad.  Recuerde que los postres como helado de fruta, [place], helados de agua y sorbetes aportan a su consumo diario de lquidos.  Los dulces pueden o no ser deseables para usted. Siempre siga el consejo de su  dietista, quien conoce mejor sus necesidades individuales. Postres  Mejores opciones Malas opciones  Las tortas pastel de ngel, de [place], de limn, panqu, de [place], blanca o amarilla pueden estar cubiertas con crema batida y frutas con poco potasio Tortas  con mucha cantidad de chocolate, coco, frutos secos o nueces, como zanahoria, mousse de chocolate, pastel de chocolate, fruta o torta alemana de chocolate  Galletitas de azcar, [employer], obleas de vainilla, galletitas de crema de limn y manteca Brownies, galletas de chocolate, macarrones de [place], canela y Forensic scientist de fruta, sorbete, helado de agua H Helado o yogur helado  Postres de frutas con poco potasio como fresas, tarta de fresas, postres con gelatina Frutas con mayor cantidad de potasio, como banana, naranja o kiwi  Pasteles, tartas o tortas hechos con manzanas, arndanos, cerezas, merengue de limn o fresas Pasteles como crema de banana, crema de chocolate, coco, picadillo de frutos secos, pacana, calabaza, batata o torta de queso  Restaurantes tnicos o especializados: Un aspecto agradable de comer afuera es probar distintas comidas tnicas y regionales. Siga estas sugerencias para tomar buenas decisiones. Comida [place]: Puede tener contenido muy alto de [place].  Pida que no haya MSG (glutamato monosdico), soja ni salsa de pescado en la preparacin de la comida. (El men puede incluir esta informacin).  Evite los restaurantes que cocinan a granel; busque aquellos que preparan los alimentos en forma individual. Puede llamar por anticipado para obtener esta informacin.  Evite agregar salsa de soja a la comida una vez que est servida. La [employer] de los restaurantes chinos ofrecen aceite de aj picante. Esto puede agregarse para que la comida est ms condimentada, si lo desea.  Las sopas que se sirven con la comida generalmente tienen gran cantidad de sodio y pueden agregar un peso de lquido no deseado.              Elija vegetales con poca cantidad de potasio, como arvejas, judas verdes, castaas de agua, frijoles germinados y col [place]. Pida vegetales salteados que no se sirvan con salsas pesadas.  El arroz al vapor es ms autntico y tiene menos sodio que el arroz frito.  El t a menudo se sirve en un jarro en la mesa. Controle la cantidad que se vierte en su taza para ayudar a controlar su aumento de peso en lquidos.  Disfrute de su galleta de la fortuna, al saber que ha realizado las mejores elecciones. Comida francesa [place]: Puede tener gran cantidad de fsforo.  Los restaurantes franceses generalmente usan ingredientes frescos, pero es posible que agreguen crema y [place] en grandes cantidades.  Trate de evitar esos alimentos preparados en salsas de queso o crema. Hacer preguntas cuidadosas al Stoney Bang Merilynn [place] a tomar su decisin.  Elija frutas y vegetales con poca cantidad de potasio, y evite las papas fritas (pommes frites) que tienen mucho potasio.  El pan francs es una buena opcin, ya que tiene [place], potasio y fsforo. La manteca que se sirve es generalmente dulce o sin sal.  Seleccione una vinagreta simple y [place] aderezo para ensaladas.  Los postres siempre son destacados. Busque sorbetes deliciosos (cuentan como lquido), tortas, merengues o frutas, como ciruelas, fresas o cerezas, que no tengan salsas de crema pesadas ni una gruesa cobertura de chocolate. Comida mexicana [place]: Puede tener Lowe[employer] Companies.  Separe las papas fritas y la salsa que haya en la mesa cuando se siente. Guarde el sodio y el potasio para la comida.  Pida a la carta, o seleccione platos principales que no se sirvan con frijoles y Equities trader. Algunas buenas opciones son tacos, tostadas y fajitas.  Tenga cuidado con las salsas que se usen. La salsa verde est hecha con tomates verdes. Las salsas hechas  con ajes sin tomate agregado son las mejores opciones. Recuerde  que el guacamole est hecho con palta, que tiene [employer] Companies.  Las tortillas son buenos sustitutos para el pan. Disfrtelas!  Para el postre, pruebe las sopapillas (masa frita) con miel, que son Ardelia Mems buena opcin porque tienen baja cantidad de potasio y fsforo. Sin embargo, las [employer]Ex alto contenido de grasa saturada, as que asegrese de comerlas en porciones moderadas. Comida indoasitica [place]: Puede tener gran cantidad de fsforo.  Las comidas de la [place] a menudo son Geophysicist/field seismologist. Si bien es Dispensing optician los platos con frijoles, hay muchas otras comidas deliciosas para elegir.  Disfrute de experimentar con distintos sabores. Las preparaciones con masala, tandori y curry estn ampliamente disponibles en mens como platos principales con pollo y vegetales.  Recuerde preguntar qu vegetales se incluyen en los platos, y elija alimentos que contengan poco potasio. Pdale a su dietista una lista de vegetales con poco potasio y pregntele qu vegetales debe limitar.  El yogur a menudo se sirve como guarnicin o como parte de guarniciones, pero recuerde que es un alimento lcteo con gran cantidad de fsforo.  Se sirve una gran seleccin de panes indios, fritos, horneados o asados, con la [employer]	[place] comidas o tambin estn disponibles para pedirlos por separado.  La [employer] de los postres contienen leche o leche en polvo en la receta, y tienen gran cantidad de fsforo. Recuerde llevar su aglutinante de fosfato. Comida italiana [place]: Puede tener [employer] Companies.  Tenga cuidado con las entradas tpicas italianas que contienen salchichas saladas y vegetales en escabeche o marinados. Estos alimentos pueden darle a su comida un comienzo salado y de alto contenido en grasas. [place]ems, evite el minestrn, que tiene mucha sal y [place].  Pida un aderezo de aceite y vinagre para las [place].  El pan italiano es una buena opcin. Mojar pan en aceite de oliva es una alternativa ms  saludable que untarlo con [place]. El aceite de oliva contiene grasas saludables para el corazn, no la gran cantidad de [place] animal que tiene la [place].  Las salsas de tomate no son buenas elecciones, pero las pastas se sirven con muchas salsas que no tienen tomate. Si selecciona una pasta que tenga salsa de [place], pida que se la sirvan al [place]. Limite el consumo de salsas blancas y con queso.  Si elige pizza, busque una con ingredientes livianos o vegetarianos, y pida que utilicen poca salsa de tomate y [place]. Es Dispensing optician las pizzas con salchichn o Child psychotherapist, que tienen un alto contenido de [place]. Otros ingredientes disponibles pueden incluir pollo o pescado, que son buenas opciones.  Se puede agregar una cucharada de queso parmesano o romano rallado para dar sabor. La pimienta puede usarse en abundancia.  Los helados italianos son buenas opciones para el postre, pero recuerde contarlos como lquido. Comida japonesa [place]: Puede tener contenido muy alto de sodio.  Evite las sopas saladas, como la de miso, que se sirven al principio de la comida.  Pida que no haya MSG (glutamato monosdico) y evite la salsa de soja.  El sushi puede ser una buena opcin, porque las porciones son pequeas. Evite el pescado crudo, porque puede exponerlo a infecciones parasitarias. Otros rollos incluyen pepinos y langostinos, cangrejos o anguilas cocidos.  Pruebe el [place], o comida grillada en pinchos sobre fuego al carbn. Los alimentos fritos en tempura son buenas opciones si no se mojan en salsas con alto contenido de sodio.  El tofu, o queso de  soja, se [place] habitualmente en la cocina japonesa. Es un sustituto de la carne en la dieta para el rin. Tenga en cuenta que generalmente se cocina con salsa de soja para aadir sabor.  Las churrasqueras japonesas ofrecen buenas opciones de carne y Photographer grillados, pero es posible que las porciones sean abundantes. Comida tradicional del  sur de [place] Unidos  [place]: Puede tener [place], potasio y fsforo.  Esta comida es todo un desafo para la dieta para el rin porque frecuentemente tiene alto contenido de [place], [place], fsforo y [place].  Se deben evitar las carnes saladas y curadas, tales como [place], salchichas, tocino y cerdo [place]. El tocino y la grasa de tocino se utilizan extensamente en la coccin.  Las [place], como los menudos de cerdo, Soil scientist mayor cantidad de fsforo que los cortes de carne de [place]. Limtelos para el consumo ocasional.  Los frijoles secos y los frijoles carete tienen mucho fsforo y Field seismologist. Limtelos a pequeas cantidades (de 1 a 2 cucharadas).  Las verduras de hojas verdes y la espinaca cocidas son populares. Ambos son vegetales con alto contenido de potasio. Las hojas de mostaza tienen una cantidad de Psychologist, educational.  Los pasteles de ame y batata tienen gran cantidad de potasio.  Las mejores opciones de comidas pueden incluir pollo frito (sin piel), maz, judas verdes o calal, [place], pan de maz, [place], y torta de banana, torta de [place] o pastel de durazno para Child psychotherapist. Disfrute, y no olvide sus aglutinantes de fosfato. Comidas rpidas:  Comer en restaurantes de comida rpida no es algo totalmente descartado. Sin embargo, requiere Insurance risk surveyor y planificacin. Si bien muchos productos de comida rpida son salados previamente, puede haber otros productos que se preparan sin sal. Tambin puede omitir los condimentos con gran cantidad de sodio, como la salsa barbacoa o de soja, y Engineering geologist otros, como el ketchup, a un solo paquete. Muchos restaurantes de comidas rpidas ofrecen informacin nutricional, por lo cual puede controlar el contenido de sodio y [place]. Su dietista especializado en rin tambin puede proporcionar esta informacin e indicarle las cantidades especficas de sodio y potasio permitidas en su dieta. Comidas rpidas  Mejores  opciones Malas opciones  Hamburguesas de tamao regular o junior Hamburguesas o hamburguesas con queso de tamao grande, sper o extragrande  Sndwiches de bife o pavo asado Sndwiches con tocino, salsas o queso  Sndwiches de pollo grillado o asado, ensalada de atn o pollo Sndwiches de pollo frito o rebozado, bocaditos o bastones de pollo  Aros de cebolla sin sal Papas fritas, Tater Tots, papas fritas para copetn, papas al horno, ensalada de papas, frijoles horneados  [place] de [place], ensalada de [place], ensalada de macarrones Alimentos con gran cantidad de potasio de la barra de ensaladas o en escabeche; cantidad limitada de tomates  Refrescos que no sean cola, limonada, t y caf, [place] y bebidas cola  Dnde puede obtener ms informacin?  Si tiene [place] pregunta, hable con su equipo de atencin mdica. Ellos lo conocen y pueden responder preguntas acerca de usted.  Llame a la lnea gratuita de ayuda para pacientes de NKF Cares al [phone] o [email or web address]

## 2018-09-07 NOTE — Progress Notes (Signed)
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 Pt PMHx: L BKA, T2DM, HTN, anemia, ESRD, hypotension   OT comments  Pt demonstrates LB dressing, bathing, w/c transfer and need to talk with pharmacy about medications to ensure understanding of medication management. Pharmacy to provide patient medication with labels in [place]. Pt dressed at this time in bed.    Follow Up Recommendations  Home health OT    Equipment Recommendations  Tub/shower bench;3 in 1 bedside commode;Wheelchair (measurements OT)    Recommendations for Other Services      Precautions / Restrictions Precautions Precautions: Other (comment) Precaution Comments: wound vac Restrictions RLE Weight Bearing: Non weight bearing       Mobility Bed Mobility Overal bed mobility: Modified Independent                Transfers Overall transfer level: Modified independent   Transfers: Anterior-Posterior Transfer;Lateral/Scoot Transfers       Anterior-Posterior transfers: Modified independent (Device/Increase time)  Lateral/Scoot Transfers: Modified independent (Device/Increase time) General transfer comment: cues for positioning on R LE for safety with wound vac    Balance                                           ADL either performed or assessed with clinical judgement   ADL Overall ADL's : Needs assistance/impaired Eating/Feeding: Independent   Grooming: Wash/dry hands       Lower Body Bathing: Independent Lower Body Bathing Details (indicate cue type and reason): pt asking about bathing. pt educated sponge bath only at this time. pt asking several times about bathing even after answered. pt provided sponge bath for demonstration and understanding.  Upper Body Dressing : Modified independent   Lower Body  Dressing: Modified independent   Toilet Transfer: Supervision/safety             General ADL Comments: pt demonstrates transfer into and out of w/c and to assess proper fit. pt educated on extension of bil knee in w/c on surface such as couch or bed. pt with demonstration for understanding. pt educated on lateral transfer but when asked to return to bed chose to complete anterior transfer.    Pt educated on clean linen on R LE for wound care and decr risk for infection.   Vision       Perception     Praxis      Cognition Arousal/Alertness: Awake/alert Behavior During Therapy: WFL for tasks assessed/performed Overall Cognitive Status: Impaired/Different from baseline                                 General Comments: pt requires information repeated even with demonstration. pt makes generalized responses like "i will take very good care" Pt educated and asked to provide teach back        Exercises   Shoulder Instructions       General Comments educated on dressing LB wtih wound vac and educated on placement of wound vac. wound vac plugged in to charge    Pertinent Vitals/ Pain       Pain Assessment: 0-10 Pain Score: 0-No pain Faces Pain Scale: No hurt  Home Living  Prior Functioning/Environment              Frequency  Min 3X/week        Progress Toward Goals  OT Goals(current goals can now be found in the care plan section)  Progress towards OT goals: Progressing toward goals  Acute Rehab OT Goals Patient Stated Goal: to go home OT Goal Formulation: With patient Time For Goal Achievement: 09/20/18 Potential to Achieve Goals: Good ADL Goals Pt Will Perform Grooming: Independently;sitting Pt Will Perform Lower Body Bathing: with modified independence;sitting/lateral leans Pt Will Transfer to Toilet: with modified independence;anterior/posterior transfer;bedside commode Pt Will  Perform Toileting - Clothing Manipulation and hygiene: with modified independence;sitting/lateral leans Additional ADL Goal #1: Pt will improve with ADLs to modified independence with least restrictive devices. Additional ADL Goal #2: pt will tolerate w/c mobility for a community distance as his apartment has long hallways with modified independence.  Plan Discharge plan remains appropriate    Co-evaluation                 AM-PAC OT "6 Clicks" Daily Activity     Outcome Measure   Help from another person eating meals?: None Help from another person taking care of personal grooming?: None Help from another person toileting, which includes using toliet, bedpan, or urinal?: A Little Help from another person bathing (including washing, rinsing, drying)?: A Little Help from another person to put on and taking off regular upper body clothing?: None Help from another person to put on and taking off regular lower body clothing?: A Little 6 Click Score: 21    End of Session    OT Visit Diagnosis: Muscle weakness (generalized) (M62.81);Pain Pain - Right/Left: Right Pain - part of body: Leg   Activity Tolerance Patient tolerated treatment well   Patient Left in bed;with call bell/phone within reach;with bed alarm set   Nurse Communication Mobility status        Time: 1350-1430 OT Time Calculation (min): 40 min  Charges: OT General Charges $OT Visit: 1 Visit OT Treatments $Self Care/Home Management : 23-37 mins $Therapeutic Activity: 8-22 mins   Jeri Modena, OTR/L  Acute Rehabilitation Services Pager: [phone] Office: [phone] .    Jeri Modena 09/07/2018, 3:25 PM

## 2018-09-07 NOTE — Progress Notes (Addendum)
Nutrition Follow-up  RD working remotely.  DOCUMENTATION CODES:   Not applicable  INTERVENTION:   -Continue renal MVI daily -Continue 30 ml Prostat BID, each supplement provides 100 kcals and 15 grams protein  NUTRITION DIAGNOSIS:   Increased nutrient needs related to wound healing as evidenced by estimated needs.  Ongoing  GOAL:   Patient will meet greater than or equal to 90% of their needs  Met with meals and PO supplements  MONITOR:   PO intake, Supplement acceptance, Skin, Weight trends, Labs  REASON FOR ASSESSMENT:   Consult Wound healing  ASSESSMENT:   48 yo male, admitted with ankle abscess. PMH significant for hepatitis C, MRSA, pancreatitis, chronic diarrhea, HIV, AIDS, hypotension, severe PCM, diabetic neuropathy, DM, ESRD on HD, stage 2 pressure ulcer of BKA stump.  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   Last HD on 09/05/18. Per nephology notes, expected dry weight 52 kg.   Pt remains with good appetite, consuming 100% of meals. He is also taking Prostat supplements per MAR.   Per orthopedics notes, plan to d/c home with wound vac for approximately one week.   Lab Results  Component Value Date   HGBA1C 9.0 (H) 06/18/2018   PTA DM medications are 15 units insulin NPO regular insulin (70-30) q AM and 10 units q PM.  Labs reviewed: Na: 133, K: 3.3, Phos WDL, CBGS: 161-344  (inpatient orders for glycemic control are 0-5 units insulin aspart q HS, 0-9 units insulin aspart TID with melas, 10 units insulin glargine daily).   ADDENDUM: Received message via secure chat from physical therapist that pt has multiple questions regarding renal diet (this was also communicated with RN per PT). Pt is being discharged home today per orders. RD provided handouts in Spanish from [facility] (information regarding Sodium, Potassium, Phosphorus Restrictions, as well as eating out). Pt also has access to RD at [place], where he can follow-up with further questions or concerns.  NUTRITION - FOCUSED PHYSICAL EXAM:    Most Recent Value  Orbital Region  Unable to assess  Upper Arm Region  Unable to assess  Thoracic and Lumbar Region  Unable to assess  Buccal Region  Unable to assess  Temple Region  Unable to assess  Clavicle Bone Region  Unable to assess  Clavicle and Acromion Bone Region  Unable to assess  Scapular Bone Region  Unable to assess  Dorsal Hand  Unable to assess  Patellar Region  Unable to assess  Anterior Thigh Region  Unable to assess  Posterior Calf Region  Unable to assess  Edema (RD Assessment)  Unable to assess  Hair  Unable to assess  Eyes  Unable to assess  Mouth  Unable to assess  Skin  Unable to assess  Nails  Unable to assess       Diet Order:   Diet Order            Diet renal with fluid restriction Fluid restriction: 1200 mL Fluid; Room service appropriate? Yes; Fluid consistency: Thin  Diet effective now              EDUCATION NEEDS:   Education needs have been addressed  Skin:  Skin Assessment: Skin Integrity Issues: Skin Integrity Issues:: Wound VAC Wound Vac: rt BKA site Other: s/p rt  BKA  Last BM:  09/06/18  Height:   Ht Readings from Last 1 Encounters:  09/04/18 5' (1.524 m)    Weight:   Wt Readings from Last 1 Encounters:  09/06/18 48.3 kg    Ideal Body Weight:  41.9 kg(adjusted for bilateral BKA)  BMI:  Body mass index is 20.8 kg/m.  Estimated Nutritional Needs:   Kcal:  1500-1700  Protein:  80-95 grams  Fluid:  1.2 L    Kyleigha Markert A. Jimmye Norman, RD, LDN, [place] Registered Dietitian II Certified Diabetes Care and Education Specialist Pager: [phone] After hours Pager: [phone]

## 2018-09-07 NOTE — TOC Transition Note (Signed)
Transition of Care [facility]) - CM/SW Discharge Note   Patient Details  Name: Alvin Daniels MRN: [id number] Date of Birth: 01-Nov-1970  [facility]) CM/SW Contact:  Bartholomew Crews, RN Phone Number: [phone] 09/07/2018, 1:33 PM   Clinical Narrative:    Spoke with patient at bedside using language line on speaker phone. Patient not wanting to go to any type of rehab stating that he wants to go home. Has a caregiver who lives near him who assists with adls, Iadls, and medical appointments. Agrees to having PT and OT provide in-home therapy. [facility] contacted for charity services. Able to find used wheelchair from [facility] to assess chair for safety. Assisted with medication costs through [employer]. [place] to change f/u appt with Dr. Sharol Given. [facility] arranged for transport to and from this appt on Monday 09/14/2018. Bedside nurse contacted friend about transportation home. No other identified transition of care needs.   Final next level of care: Home w Home Health Services Barriers to Discharge: No Barriers Identified   Patient Goals and CMS Choice   CMS Medicare.gov Compare Post Acute Care list provided to:: Patient Choice offered to / list presented to : Patient  Discharge Placement                       Discharge Plan and Services In-house Referral: Clinical Social Work Discharge Planning Services: CM Consult            DME Arranged: Programmer, multimedia DME Agency: Other - Comment([facility]) Date DME Agency Contacted: 09/07/18 Time DME Agency Contacted: 69 Representative spoke with at DME Agency: [place]: PT, OT [place] Agency: [facility] (formerly [employer]) Date [place]: 09/07/18 Time [place]: 1030 Representative spoke with at [place]: [place] ([place]) Interventions      Readmission Risk Interventions Readmission Risk Prevention Plan 09/03/2018  PCP or Specialist Appt within 3-5 Days Complete  Some recent data might be hidden

## 2018-09-07 NOTE — Progress Notes (Signed)
PHARMACY CONSULT NOTE FOR:  OUTPATIENT  PARENTERAL ANTIBIOTIC THERAPY (OPAT)  Indication: Vancomycin Regimen: Osteomyelitis/Bacteremia End date: 09/18/18  IV antibiotic discharge orders are pended. To discharging provider:  please sign these orders via discharge navigator,  Select New Orders & click on the button choice - Manage This Unsigned Work.     Alvin Daniels A. Levada Dy, PharmD, [place] Please utilize Amion for appropriate phone number to reach the unit pharmacist ([place])    Thank you for allowing pharmacy to be a part of this patient's care.  Sharyon Peitz A Margia Wiesen 09/07/2018, 11:10 AM

## 2018-09-07 NOTE — Progress Notes (Signed)
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 Pt PMHx: L BKA, T2DM, HTN, anemia, ESRD, hypotension    PT Comments    Continuing work on functional mobility and activity tolerance;  Session focused on therex, stretching and strengthening for home exercise program to set him up well for eventual bilateral prostheses; noted plan has changed to dc home and he moves well -- PT in agreement; Noted he will get HHPT -- I ask that they continue to reinforce positioning with bil knees in extension at rest to prevent knee flexion contractures (consider having a board under wc cushion on R to keep knee in extension at rest; Pt had lots of questions about the renal diet -- notifed RN and Dietician  Follow Up Recommendations  Home health PT;Supervision - Intermittent     Equipment Recommendations  Wheelchair (measurements PT);3in1 (PT);Standard walker    Recommendations for Other Services       Precautions / Restrictions Precautions Precautions: Other (comment) Precaution Comments: wound vac Restrictions RLE Weight Bearing: Non weight bearing    Mobility  Bed Mobility Overal bed mobility: Modified Independent             General bed mobility comments: Moving well in bed; able to get to prone positioning and back without issue  Transfers                    Ambulation/Gait                 Stairs             Wheelchair Mobility    Modified Rankin (Stroke Patients Only)       Balance                                            Cognition Arousal/Alertness: Awake/alert Behavior During Therapy: WFL for tasks assessed/performed Overall Cognitive Status: (P) Impaired/Different from baseline                                 General Comments: (P) pt requires  information repeated even with demonstration. pt makes generalized responses like "i will take very good care" Pt educated and asked to provide teach back      Exercises Amputee Exercises Quad Sets: Right;10 reps;Seated Gluteal Sets: AROM;Both;10 reps(prone) Hip Extension: AROM;Both;15 reps;Prone(Alternating) Hip ABduction/ADduction: AROM;Right;10 reps;Sidelying Other Exercises Other Exercises: Isometric hip abduction with belt around knees x10 reps Other Exercises: Hamstring stretches with 20 sec hold x3    General Comments General comments (skin integrity, edema, etc.): Lengthy discussion re: dc'ing home today, need for wheelchair, positioning and therex in prep for prostheses; he again asked for a power wc, and again was told that a power chair is not what he needs because he is strong to be able to porpel himself and we are hoping for bilateral prostheses in the future.      Pertinent Vitals/Pain Pain Assessment: 0-10 Pain Score: 0-No pain Faces Pain Scale: No hurt    Home Living                      Prior Function  PT Goals (current goals can now be found in the care plan section) Acute Rehab PT Goals Patient Stated Goal: to go home Additional Goals Additional Goal #1: Pt will propel wheelchair and manage parts independently. Progress towards PT goals: Progressing toward goals    Frequency    Min 3X/week      PT Plan Discharge plan needs to be updated    Co-evaluation              AM-PAC PT "6 Clicks" Mobility   Outcome Measure  Help needed turning from your back to your side while in a flat bed without using bedrails?: None Help needed moving from lying on your back to sitting on the side of a flat bed without using bedrails?: None Help needed moving to and from a bed to a chair (including a wheelchair)?: A Little Help needed standing up from a chair using your arms (e.g., wheelchair or bedside chair)?: Total Help needed to walk in  hospital room?: Total Help needed climbing 3-5 steps with a railing? : Total 6 Click Score: 14    End of Session   Activity Tolerance: Patient tolerated treatment well Patient left: in bed;with call bell/phone within reach Nurse Communication: Other (comment)(pt's questions about the renal diet) PT Visit Diagnosis: Difficulty in walking, not elsewhere classified (R26.2)     Time: 4628-6381 PT Time Calculation (min) (ACUTE ONLY): 42 min  Charges:  $Therapeutic Exercise: 23-37 mins $Therapeutic Activity: 8-22 mins                     Roney Marion, PT  Acute Rehabilitation Services Pager [phone] Office [phone]    Colletta [place] 09/07/2018, 3:22 PM

## 2018-09-07 NOTE — Progress Notes (Addendum)
[facility] Progress Note   Subjective:   Patient seen in room. Denies any SOB, dyspnea, CP, abdominal pain, N/V. No pain of foot at present. Patient discussed possibility of kidney transplant with MD via translator. According to outpatient nephrologist, patient is undocumented and emergency medicaid only, so is not a candidate for transplant in the [place]. Patient reports he does have a possible donor in [place] where he is a citizen, and is going to pursue this once his foot heals.   Objective Vitals:   09/06/18 1644 09/06/18 2046 09/07/18 0551 09/07/18 1024  BP: 109/66 127/76 107/70 117/79  Pulse: 86 83 80 83  Resp: 17 18 18 18   Temp: 98.4 F (36.9 C) 98.5 F (36.9 C) 98.1 F (36.7 C) 98.3 F (36.8 C)  TempSrc: Oral  Oral Oral  SpO2: 100% 99% 100% 100%  Weight:  48.3 kg    Height:       Physical Exam General: Well developed male, sitting up in bed in NAD Heart: RRR, no murmurs, rubs or gallops. Lungs: CTA bilaterally without wheezing, rhonchi or rales Abdomen: Soft, non-distended, non-tender. + BS Extremities: L BKA, new R BKA. No peripheral edema Dialysis Access: LUE AVF + thrill  Additional Objective Labs: Basic Metabolic Panel: Recent Labs  Lab 09/03/18 0543 09/04/18 0713 09/05/18 0737  NA 133* 136 133*  K 3.7 3.9 3.3*  CL 95* 96* 96*  CO2 25 27 25   GLUCOSE 220* 146* 251*  BUN 43* 19 25*  CREATININE 8.13* 5.30* 5.11*  CALCIUM 8.6* 8.6* 7.7*  PHOS  --   --  3.3   Liver Function Tests: Recent Labs  Lab 09/05/18 0737  ALBUMIN 1.8*   No results for input(s): LIPASE, AMYLASE in the last 168 hours. CBC: Recent Labs  Lab 09/01/18 0434 09/03/18 0543 09/05/18 0738  WBC 10.9* 7.7 4.6  NEUTROABS  --  5.1 3.3  HGB 7.3* 8.8* 8.5*  HCT 24.0* 28.0* 27.7*  MCV 93.8 94.0 95.8  PLT 205 260 262   Blood Culture    Component Value Date/Time   SDES BLOOD RIGHT ANTECUBITAL 08/31/2018 1400   SPECREQUEST AEROBIC BOTTLE ONLY Blood Culture adequate volume  08/31/2018 1400   CULT  08/31/2018 1400    NO GROWTH 5 DAYS Performed at [facility], [place] [address] [place], [place] [address]    REPTSTATUS 09/05/2018 FINAL 08/31/2018 1400    Cardiac Enzymes: No results for input(s): CKTOTAL, CKMB, CKMBINDEX, TROPONINI in the last 168 hours. CBG: Recent Labs  Lab 09/06/18 1127 09/06/18 1642 09/06/18 2047 09/07/18 0258 09/07/18 0718  GLUCAP 179* 344* 303* 161* 266*   Iron Studies: No results for input(s): IRON, TIBC, TRANSFERRIN, FERRITIN in the last 72 hours. @lablastinr3 @ Studies/Results: No results found. Medications: . sodium chloride    . methocarbamol (ROBAXIN) IV     . bictegravir-emtricitabine-tenofovir AF  1 tablet Oral Daily  . Chlorhexidine Gluconate Cloth  6 each Topical Q0600  . darbepoetin (ARANESP) injection - DIALYSIS  200 mcg Intravenous Q Tue-HD  . docusate sodium  100 mg Oral BID  . doxercalciferol  2 mcg Intravenous Q T,Th,Sa-HD  . feeding supplement (PRO-STAT SUGAR FREE 64)  30 mL Oral BID  . heparin  5,000 Units Subcutaneous Q8H  . insulin aspart  0-5 Units Subcutaneous QHS  . insulin aspart  0-9 Units Subcutaneous TID WC  . insulin glargine  10 Units Subcutaneous Daily  . lidocaine (PF)  10 mL Infiltration Once  . midodrine  10 mg  Oral Once per day on Tue Thu Sat  . multivitamin  1 tablet Oral QHS  . vancomycin  500 mg Intravenous Q T,Th,Sa-HD    Dialysis Orders: TTS at [facility] 4hr, 400/A1.5, EDW 52kg, 2K/2Ca, Linear Na, LUE AVF, heparin 2000 bolus - Hectoral 91mcg IV q HD - Just finished course of Venofer - Mircera 1109mcg IV q 2 weeks (last given 4/7)  Assessment/Plan: 1.s/p R BKA due toRight ankle abscess.History of osteomyelitis in 06/2018. S/p I&D in ED. Per infectious disease notes, MRI confirmed osteomyelitis. S/p BKA on 4/24. Blood culture +MRSA. Continuevancomycinper primary. 2. ESRD:TTS schedule. No evidence of volume overload on exam, BP soft/stable. K+ 3.3, will use 3K+ bath with HD  tomorrow. Interested in kidney transplant but not a candidate in [place] due to immigration status. Patient plans to pursue this option in [place]. Now 3 days post-op, will resume heparin with HD.  3.Hypotension/Volume: No evidence of volume overload on exam. BP chronically low during HD. Midodrine pre-HD on outpatient med list but has not been taking as he is unable to afford the medication. Now significantly below EDW, will need lowered at discharge.  4. Anemia:Hgb8.5. Mircera 150 mcg last dose 4/7. Given aranesp on 4/21.Transfuse PRN. 5. Secondary hyperparathyroidism:Calcium 7.7, Corrected calcium 9.5. Hectorol restarted.  6. Nutrition:Albumin 1.8. Continue pro-stat supplement  7. T2DM:per primary 8. HIV:per primary/infectious disease  Anice Paganini, PA-C 09/07/2018, 11:35 AM  [facility] Pager: [phone]  Pt seen, examined and agree w A/P as above.  [facility] 09/07/2018, 12:12 PM

## 2018-09-07 NOTE — Progress Notes (Signed)
Renal Navigator notified Education officer, museum at [place] to update her on patient's medical status and potential ongoing social needs, which she appreciated. Renal Navigator ensured that patient has transportation arranged prior to hospitalization that will continue to meet his needs post discharge. OP HD clinic Social Worker states patient get to and from OP HD clinic via [place], so this will continue post discharge. She states plans to follow up with patient/family for ongoing support. Renal Navigator will contact OP HD Clinic Manager and secretary to notify of discharge once patient is discharged from the hospital to assist with smooth transition from hospital back to OP HD clinic.  Alphonzo Cruise Renal Navigator [phone]

## 2018-09-08 NOTE — Discharge Summary (Signed)
[facility] Discharge Summary   Patient: Alvin Daniels TGY:[id number]   PCP: [name], MD DOB: 1971/03/02   Date of admission: 08/29/2018   Date of discharge: 09/07/2018     Discharge Diagnoses:   Principal Problem:   MRSA bacteremia Active Problems:   Diabetes mellitus with ESRD (end-stage renal disease) ([place])   Chronic diarrhea   ESRD on dialysis ([place])   HIV disease ([place])   Charcot foot due to diabetes mellitus ([place])   Abscess of ankle   Abscess of foot  Admitted From: home Disposition:  home  Recommendations for Outpatient Follow-up:  1. Please follow up with PCP in 1 week   Follow-up [place]. Go on 09/16/2018.   Why:  appt is scheduled at 9:30am Patient will need to call SCAT [phone] on 4/29 to arrange transportation (can only schedule 7 days out).  Contact information: [place] [address] [place], [facility] up.   Specialty:  Home Health Services Why:  physical therapy and occupational therapy will work with you in the home to assist you with new skills  Contact information: [place] [address] [phone]        [facility]             . Schedule an appointment as soon as possible for a visit in 2 week(s).   Contact information: [address] [place] [address]         Diet recommendation: renal diet  Activity: The patient is advised to gradually reintroduce usual activities.  Discharge Condition: good  Code Status: full code  History of present illness: As per the H and P dictated on admission, "[name] is a 48 y.o. male with medical history significant of ESRD due to uncontrolled DM2, HIV, HCV, chronic diarrhea, s/p L BKA.  Patient was recently admitted in Feb this year with sepsis due to MRSA bacteremia, belived to  be due to R foot source, no osteo on MRI but question of abscess at that time.  Today patient presents to the ED with c/o swelling of R ankle for past 15 days or so.  Hasnt seen doctor about this until today.  Had fever at home about 2 days ago.  Appetitie normal.  Has TTP of R ankle, missed thurs dialysis but went today."  Hospital Course:  Summary of his active problems in the hospital is as following. 1.  MRSA bacteremia Right ankle abscess with Charcot's joint. Osteomyelitis of right foot.  S/P BKA Presents with drainage from right foot ulcer, bedside I&D was performed in the ER.  Cultures were sent out which grew MRSA. Blood cultures were performed which also grew MRSA on admission. Repeat cultures so far negative. ID consulted. Echocardiogram-TTE negative for any significant vegetation or regurgitation.  Will not require TEE Started on IV vancomycin with HD. Initial CT scan was suggestive of no evidence of septic joint although an MRI is positive for osteomyelitis of distal fibula, talus, calcaneus, cuboid which is new since his last MRI 2 months ago. Orthopedic Dr. Sharol Given consulted, BKA, tolerated very well Resting right ABI indicates noncompressible arteries. Patient will continue vancomycin with HD for 2 weeks post BKA.  ID signed off  2. ESRD on HD. Anemia of chronic kidney disease Appreciate nephrology assistance. Hemodialysis on TTS. Hemoglobin currently stable. Management of  anemia per nephrology.  3.  HIV. CD4 count dropped from 300-200. Current recommendation is to continue Biktarvy.  4. Chronic hypotension. Patient is on midodrine which I will continue. Blood pressure currently high 90s SBP. Asymptomatic.  5. Type 2 Diabetes Mellitus, uncontroled with renal and neuropathic complication last hemoglobin A1c was 9.0 in 06/18/2018. Continue home regimen  6. Increased nutrient needsrelated to wound healing Body mass index is 20.8 kg/m.  Nutrition Problem:  Increased nutrient needs Etiology: wound healing Nutrition Interventions: Interventions: Prostat   Pain control  - [place] Controlled Substance Reporting System database was reviewed. - Patient was instructed, not to drive, operate heavy machinery, perform activities at heights, swimming or participation in water activities or provide baby sitting services while on Pain, Sleep and Anxiety Medications; until his outpatient Physician has advised to do so again.  - Also recommended to not to take more than prescribed Pain, Sleep and Anxiety Medications.  Patient was seen by physical therapy, who recommended CIR, pt wanted to home, which was arranged by case manager. On the day of the discharge the patient's vitals were stable , and no other acute medical condition were reported by patient. the patient was felt safe to be discharge at home with home health.  Consultants: Infectious disease, orthopedic Dr. Sharol Given, nephrology Procedures: ABI, BKA  DISCHARGE MEDICATION: Allergies as of 09/07/2018   No Known Allergies     Medication List    TAKE these medications   acetaminophen 325 MG tablet Commonly known as:  TYLENOL Take 2 tablets (650 mg total) by mouth every 6 (six) hours as needed for mild pain.   bictegravir-emtricitabine-tenofovir AF 50-200-25 MG Tabs tablet Commonly known as:  Biktarvy Take 1 tablet by mouth daily.   docusate sodium 100 MG capsule Commonly known as:  COLACE Take 1 capsule (100 mg total) by mouth 2 (two) times daily as needed for mild constipation.   feeding supplement (NEPRO CARB STEADY) Liqd Take 237 mLs by mouth 2 (two) times daily between meals.   feeding supplement (PRO-STAT SUGAR FREE 64) Liqd Take 30 mLs by mouth 2 (two) times daily.   HYDROcodone-acetaminophen 5-325 MG tablet Commonly known as:  NORCO/VICODIN Take 1 tablet by mouth every 6 (six) hours as needed for up to 5 days for moderate pain or severe pain.   insulin NPH-regular Human  (70-30) 100 UNIT/ML injection Inject 10-15 Units into the skin See admin instructions. Inject 15 units subcutaneous in the a.m. and 10 units subcutaneous in the p.m. with meals   midodrine 10 MG tablet Commonly known as:  PROAMATINE Take 1 tablet (10 mg total) by mouth Every Tuesday,Thursday,and Saturday with dialysis.   vancomycin  IVPB Inject 500 mg into the vein Every Tuesday,Thursday,and Saturday with dialysis for 10 days. Indication:  Osteomyelitis/Bacteremia Last Day of Therapy:  09/18/2018 Labs - Sunday/Monday:  CBC/D, BMP, and vancomycin trough. Labs - Thursday:  BMP and vancomycin trough Labs - Every other week:  ESR and CRP      No Known Allergies Discharge Instructions    Diet renal 60/[id number]   Complete by:  As directed    Discharge instructions   Complete by:  As directed    It is important that you read the given instructions as well as go over your medication list with RN to help you understand your care after this hospitalization.  Discharge Instructions: Please follow-up with PCP in 1-2 weeks  Please request your primary care physician to go over all Hospital Tests and Procedure/Radiological  results at the follow up. Please get all Hospital records sent to your PCP by signing hospital release before you go home.   Do not drive, operating heavy machinery, perform activities at heights, swimming or participation in water activities or provide baby sitting services because you are on Pain, Sleep and Anxiety Medications; until you have been seen by Primary Care Physician or a Neurologist and advised to do so again. Do not take more than prescribed Pain, Sleep and Anxiety Medications. You were cared for by a hospitalist during your hospital stay. If you have any questions about your discharge medications or the care you received while you were in the hospital after you are discharged, you can call the unit @UNIT @ you were admitted to and ask to speak with the hospitalist  on call if the hospitalist that took care of you is not available.  Once you are discharged, your primary care physician will handle any further medical issues. Please note that NO REFILLS for any discharge medications will be authorized once you are discharged, as it is imperative that you return to your primary care physician (or establish a relationship with a primary care physician if you do not have one) for your aftercare needs so that they can reassess your need for medications and monitor your lab values. You Must read complete instructions/literature along with all the possible adverse reactions/side effects for all the Medicines you take and that have been prescribed to you. Take any new Medicines after you have completely understood and accept all the possible adverse reactions/side effects. Wear Seat belts while driving. If you have smoked or chewed Tobacco in the last 2 yrs please stop smoking and/or stop any Recreational drug use.  If you drink alcohol, please stop the use and do not drive, operating heavy machinery, perform activities at heights, swimming or participation in water activities or provide baby sitting services under influence.   Increase activity slowly   Complete by:  As directed      Discharge Exam: Filed Weights   09/05/18 2228 09/06/18 0114 09/06/18 2046  Weight: 48.3 kg 48.3 kg 48.3 kg   Vitals:   09/07/18 0551 09/07/18 1024  BP: 107/70 117/79  Pulse: 80 83  Resp: 18 18  Temp: 98.1 F (36.7 C) 98.3 F (36.8 C)  SpO2: 100% 100%   General: Appear in mild distress, no Rash; Oral Mucosa moist. Cardiovascular: S1 and S2 Present, no Murmur, no JVD Respiratory: Bilateral Air entry present and Clear to Auscultation, no Crackles, no wheezes Abdomen: Bowel Sound present, Soft and no tenderness Extremities: bilateral BKA Neurology: Grossly no focal neuro deficit.  The results of significant diagnostics from this hospitalization (including imaging, microbiology,  ancillary and laboratory) are listed below for reference.    Significant Diagnostic Studies: Ct Ankle Right W Contrast  Result Date: 08/29/2018 CLINICAL DATA:  Ankle swelling and pain EXAM: CT OF THE RIGHT ANKLE WITH CONTRAST TECHNIQUE: Multidetector CT imaging of the right ankle was performed following the standard protocol during bolus administration of intravenous contrast. CONTRAST:  128m OMNIPAQUE IOHEXOL 300 MG/ML  SOLN COMPARISON:  06/18/2018, 06/19/2018 FINDINGS: Bones/Joint/Cartilage The distal tibia and fibula are within normal limits. Chronically fragmented calcaneus is noted with significant bony resorption although the appearance is similar to that seen on prior plain film examination. Some lucency is noted in the distal talus although no definitive fracture is seen. The remainder of the tarsal bones and visualized metatarsals appear within normal limits. Ligaments Suboptimally assessed by CT. Muscles  and Tendons No discrete muscular abnormality is noted. Soft tissues There is an elongated fluid collection identified laterally which measures approximately 5.9 by 3.1 cm in greatest dimension. This extends for approximately 8 cm in craniocaudad projection. Multiple foci of air are noted within consistent with abscess. Similar appearing fluid collection is noted interposed between the fragments of the calcaneus also consistent with abscess. Some areas of bony lucency are noted in the fragments of the calcaneus. Underlying osteomyelitis would be difficult to exclude. Smaller fluid collections are noted along the medial aspect of the talus measuring less than 1 cm. Additionally at least 2 small fluid collections are noted along the plantar aspect of the tarsal bones each measuring just over 1 cm in greatest dimension. IMPRESSION: Multifocal abscesses surrounding the ankle joint with the largest collections lying laterally adjacent to the distal fibula and lateral aspect of the calcaneal fragments as  well as a second large collection with peripheral enhancement interposed between the fragments of the calcaneus. Communication with the tibiotalar and talofibular joints is not appreciated on this exam. Smaller fluid collections with enhancing margins representing smaller abscesses are noted along the plantar aspect of the foot. Underlying osteomyelitis involving the fragments of the calcaneus would be difficult to exclude given some degree of resorption. Electronically Signed   By: Inez Catalina M.D.   On: 08/29/2018 23:14   Mr Foot Right Wo Contrast  Result Date: 08/31/2018 CLINICAL DATA:  Chronic right foot infection. EXAM: MRI OF THE RIGHT FOOT WITHOUT CONTRAST TECHNIQUE: Multiplanar, multisequence MR imaging of the right foot was performed. No intravenous contrast was administered. COMPARISON:  MRI right foot dated June 19, 2018. Right foot x-rays dated June 18, 2018. FINDINGS: Bones/Joint/Cartilage Chronic, comminuted fracture of the calcaneus. There is new prominent marrow edema within the distal fibula, lateral talus, the calcaneal fragments, and the proximal cuboid. There is associated decreased T1 marrow signal of these bones with erosive changes of the talus and calcaneal fragments. Persistent subtalar joint effusion extending into the plantar aspect of the heel through the distracted chronic calcaneal fracture. Unchanged small tibiotalar joint effusion. Ligaments Chronically torn calcaneofibular ligament. The anterior and posterior tibiofibular, anterior and posterior talofibular, and deltoid ligaments are intact. Muscles and Tendons Increasing fluid in the peroneal tendon sheaths, as well as the posterior tibialis and flexor digitorum longus tendon sheaths. The fluid collection deep to the abductor digiti minimi tendon along the fifth metatarsal has decreased in size. There is a new 4.3 cm fluid collection in the plantar aspect of the fourth intermetatarsal space. New 1.5 cm fluid collection  along the medial aspect of the distal second metatarsal increased T2 signal within the intrinsic muscles of the forefoot, nonspecific. Soft tissues New ill-defined, curvilinear soft tissue fluid collection along the lateral ankle overlying the peroneal tendons containing a few foci of air, measuring at least 6.7 cm. There is a small draining sinus tract from this fluid collection to the skin surface over the lateral malleolus (series 6, image 7). Diffuse soft tissue swelling about the ankle and dorsal foot. IMPRESSION: 1. New osteomyelitis of the distal fibula, talus, calcaneus, and cuboid. Probable subtalar septic arthritis. Possible tibiotalar septic arthritis. 2. New ill-defined, curvilinear gas and fluid collection along the lateral ankle overlying the peroneal tendons, concerning for abscess, with small draining sinus tract to the lateral malleolar skin surface. 3. Progressive peroneal tenosynovitis, likely infectious in etiology. 4. New fluid collections in the plantar aspect of the fourth intermetatarsal space and along the distal second metatarsal,  also concerning for abscesses. 5. Unchanged chronic, comminuted fracture of the calcaneus. Electronically Signed   By: Titus Dubin M.D.   On: 08/31/2018 21:33   Dg Chest Port 1 View  Result Date: 08/27/2018 CLINICAL DATA:  Fever. EXAM: PORTABLE CHEST 1 VIEW COMPARISON:  Radiographs of June 16, 2018. FINDINGS: The heart size and mediastinal contours are within normal limits. Both lungs are clear. Old left clavicular fracture is noted. IMPRESSION: No active disease. Electronically Signed   By: Marijo Conception M.D.   On: 08/27/2018 08:05   Vas [place] Burnard Bunting With/wo Tbi  Result Date: 09/02/2018 LOWER EXTREMITY DOPPLER STUDY Indications: Ulceration.  Performing Technologist: Abram Sander RVS  Examination Guidelines: A complete evaluation includes at minimum, Doppler waveform signals and systolic blood pressure reading at the level of bilateral brachial,  anterior tibial, and posterior tibial arteries, when vessel segments are accessible. Bilateral testing is considered an integral part of a complete examination. Photoelectric Plethysmograph (PPG) waveforms and toe systolic pressure readings are included as required and additional duplex testing as needed. Limited examinations for reoccurring indications may be performed as noted.  ABI Findings: +--------+------------------+-----+----------+--------+  Right    Rt Pressure (mmHg) Index Waveform   Comment   +--------+------------------+-----+----------+--------+  Brachial 115                      triphasic            +--------+------------------+-----+----------+--------+  PTA      171                1.49  monophasic           +--------+------------------+-----+----------+--------+  DP       158                1.37  triphasic            +--------+------------------+-----+----------+--------+ +--------+------------------+-----+--------+-----------------------------------+  Left     Lt Pressure (mmHg) Index Waveform Comment                              +--------+------------------+-----+--------+-----------------------------------+  Brachial                                   unable to obtain due to upper                                                    extremity restriction                +--------+------------------+-----+--------+-----------------------------------+  PTA                                        unable to obtain due to aka          +--------+------------------+-----+--------+-----------------------------------+  DP                                         unable to obtain due to aka          +--------+------------------+-----+--------+-----------------------------------+ +-------+-----------+-----------+------------+------------+  ABI/TBI Today's ABI Today's TBI  Previous ABI Previous TBI  +-------+-----------+-----------+------------+------------+  Right   1.49                                                +-------+-----------+-----------+------------+------------+  Summary: Right: Resting right ankle-brachial index indicates noncompressible right lower extremity arteries.  *See table(s) above for measurements and observations.  Electronically signed by Curt Jews MD on 09/02/2018 at 4:01:57 PM.   Final     Microbiology: Recent Results (from the past 240 hour(s))  Blood culture (routine x 2)     Status: Abnormal   Collection Time: 08/29/18 10:10 PM  Result Value Ref Range Status   Specimen Description BLOOD RIGHT WRIST  Final   Special Requests   Final    BOTTLES DRAWN AEROBIC ONLY Blood Culture results may not be optimal due to an inadequate volume of blood received in culture bottles   Culture  Setup Time   Final    GRAM POSITIVE COCCI IN CLUSTERS AEROBIC BOTTLE ONLY CRITICAL RESULT CALLED TO, READ BACK BY AND VERIFIED WITH: PHARMD PIERCE, D 1723 [id number] FCP    Culture (A)  Final    STAPHYLOCOCCUS AUREUS SUSCEPTIBILITIES PERFORMED ON PREVIOUS CULTURE WITHIN THE LAST 5 DAYS. Performed at [facility], [place] [address], [place], [place] [address]    Report Status 09/01/2018 FINAL  Final  Blood Culture ID Panel (Reflexed)     Status: Abnormal   Collection Time: 08/29/18 10:10 PM  Result Value Ref Range Status   Enterococcus species NOT DETECTED NOT DETECTED Final   Listeria monocytogenes NOT DETECTED NOT DETECTED Final   Staphylococcus species DETECTED (A) NOT DETECTED Final    Comment: CRITICAL RESULT CALLED TO, READ BACK BY AND VERIFIED WITH: PHARMD PIERCE, D 1723 [id number] FCP    Staphylococcus aureus (BCID) DETECTED (A) NOT DETECTED Final    Comment: Methicillin (oxacillin)-resistant Staphylococcus aureus (MRSA). MRSA is predictably resistant to beta-lactam antibiotics (except ceftaroline). Preferred therapy is vancomycin unless clinically contraindicated. Patient requires contact precautions if  hospitalized. CRITICAL RESULT CALLED TO, READ BACK BY AND VERIFIED WITH: PHARMD  PIERCE, D 1723 [id number] FCP    Methicillin resistance DETECTED (A) NOT DETECTED Final    Comment: CRITICAL RESULT CALLED TO, READ BACK BY AND VERIFIED WITH: PHARMD PIERCE, D 1723 [id number] FCP    Streptococcus species NOT DETECTED NOT DETECTED Final   Streptococcus agalactiae NOT DETECTED NOT DETECTED Final   Streptococcus pneumoniae NOT DETECTED NOT DETECTED Final   Streptococcus pyogenes NOT DETECTED NOT DETECTED Final   Acinetobacter baumannii NOT DETECTED NOT DETECTED Final   Enterobacteriaceae species NOT DETECTED NOT DETECTED Final   Enterobacter cloacae complex NOT DETECTED NOT DETECTED Final   Escherichia coli NOT DETECTED NOT DETECTED Final   Klebsiella oxytoca NOT DETECTED NOT DETECTED Final   Klebsiella pneumoniae NOT DETECTED NOT DETECTED Final   Proteus species NOT DETECTED NOT DETECTED Final   Serratia marcescens NOT DETECTED NOT DETECTED Final   Haemophilus influenzae NOT DETECTED NOT DETECTED Final   Neisseria meningitidis NOT DETECTED NOT DETECTED Final   Pseudomonas aeruginosa NOT DETECTED NOT DETECTED Final   Candida albicans NOT DETECTED NOT DETECTED Final   Candida glabrata NOT DETECTED NOT DETECTED Final   Candida krusei NOT DETECTED NOT DETECTED Final   Candida parapsilosis NOT DETECTED NOT DETECTED Final   Candida tropicalis NOT DETECTED NOT DETECTED Final  Comment: Performed at [facility], [place] [address] [place], [place] [address]  Blood culture (routine x 2)     Status: Abnormal   Collection Time: 08/29/18 10:16 PM  Result Value Ref Range Status   Specimen Description BLOOD RIGHT HAND  Final   Special Requests   Final    BOTTLES DRAWN AEROBIC ONLY Blood Culture results may not be optimal due to an inadequate volume of blood received in culture bottles   Culture  Setup Time   Final    GRAM POSITIVE COCCI AEROBIC BOTTLE ONLY CRITICAL RESULT CALLED TO, READ BACK BY AND VERIFIED WITH: Marland Mcalpine [id number] MLM Performed at [facility], [place] [address], [place], [place] [address]    Culture METHICILLIN RESISTANT STAPHYLOCOCCUS AUREUS (A)  Final   Report Status 09/01/2018 FINAL  Final   Organism ID, Bacteria METHICILLIN RESISTANT STAPHYLOCOCCUS AUREUS  Final      Susceptibility   Methicillin resistant staphylococcus aureus - MIC*    CIPROFLOXACIN <=0.5 SENSITIVE Sensitive     ERYTHROMYCIN >=8 RESISTANT Resistant     GENTAMICIN <=0.5 SENSITIVE Sensitive     OXACILLIN >=4 RESISTANT Resistant     TETRACYCLINE >=16 RESISTANT Resistant     VANCOMYCIN 1 SENSITIVE Sensitive     TRIMETH/SULFA <=10 SENSITIVE Sensitive     CLINDAMYCIN <=0.25 SENSITIVE Sensitive     RIFAMPIN <=0.5 SENSITIVE Sensitive     Inducible Clindamycin NEGATIVE Sensitive     * METHICILLIN RESISTANT STAPHYLOCOCCUS AUREUS  Blood Culture ID Panel (Reflexed)     Status: Abnormal   Collection Time: 08/29/18 10:16 PM  Result Value Ref Range Status   Enterococcus species NOT DETECTED NOT DETECTED Final   Listeria monocytogenes NOT DETECTED NOT DETECTED Final   Staphylococcus species DETECTED (A) NOT DETECTED Final    Comment: CRITICAL RESULT CALLED TO, READ BACK BY AND VERIFIED WITH: PHARMD J [place] [phone] MLM    Staphylococcus aureus (BCID) DETECTED (A) NOT DETECTED Final    Comment: Methicillin (oxacillin)-resistant Staphylococcus aureus (MRSA). MRSA is predictably resistant to beta-lactam antibiotics (except ceftaroline). Preferred therapy is vancomycin unless clinically contraindicated. Patient requires contact precautions if  hospitalized. CRITICAL RESULT CALLED TO, READ BACK BY AND VERIFIED WITH: PHARMD J [place] [id number] MLM    Methicillin resistance DETECTED (A) NOT DETECTED Final    Comment: CRITICAL RESULT CALLED TO, READ BACK BY AND VERIFIED WITH: PHARMD J [place] [id number] MLM    Streptococcus species NOT DETECTED NOT DETECTED Final   Streptococcus agalactiae NOT DETECTED NOT DETECTED Final   Streptococcus pneumoniae NOT DETECTED NOT  DETECTED Final   Streptococcus pyogenes NOT DETECTED NOT DETECTED Final   Acinetobacter baumannii NOT DETECTED NOT DETECTED Final   Enterobacteriaceae species NOT DETECTED NOT DETECTED Final   Enterobacter cloacae complex NOT DETECTED NOT DETECTED Final   Escherichia coli NOT DETECTED NOT DETECTED Final   Klebsiella oxytoca NOT DETECTED NOT DETECTED Final   Klebsiella pneumoniae NOT DETECTED NOT DETECTED Final   Proteus species NOT DETECTED NOT DETECTED Final   Serratia marcescens NOT DETECTED NOT DETECTED Final   Haemophilus influenzae NOT DETECTED NOT DETECTED Final   Neisseria meningitidis NOT DETECTED NOT DETECTED Final   Pseudomonas aeruginosa NOT DETECTED NOT DETECTED Final   Candida albicans NOT DETECTED NOT DETECTED Final   Candida glabrata NOT DETECTED NOT DETECTED Final   Candida krusei NOT DETECTED NOT DETECTED Final   Candida parapsilosis NOT DETECTED NOT DETECTED Final   Candida tropicalis NOT  DETECTED NOT DETECTED Final    Comment: Performed at [facility], [place] [address], [place], [place] [address]  Wound or Superficial Culture     Status: None   Collection Time: 08/30/18 12:37 AM  Result Value Ref Range Status   Specimen Description ABSCESS  Final   Special Requests RIGHT FOOT  Final   Gram Stain   Final    FEW WBC PRESENT, PREDOMINANTLY PMN NO ORGANISMS SEEN Performed at [facility], [place] [address] [place], [place] [address]    Culture FEW METHICILLIN RESISTANT STAPHYLOCOCCUS AUREUS  Final   Report Status 09/01/2018 FINAL  Final   Organism ID, Bacteria METHICILLIN RESISTANT STAPHYLOCOCCUS AUREUS  Final      Susceptibility   Methicillin resistant staphylococcus aureus - MIC*    CIPROFLOXACIN <=0.5 SENSITIVE Sensitive     ERYTHROMYCIN >=8 RESISTANT Resistant     GENTAMICIN <=0.5 SENSITIVE Sensitive     OXACILLIN >=4 RESISTANT Resistant     TETRACYCLINE >=16 RESISTANT Resistant     VANCOMYCIN 1 SENSITIVE Sensitive     TRIMETH/SULFA <=10 SENSITIVE  Sensitive     CLINDAMYCIN <=0.25 SENSITIVE Sensitive     RIFAMPIN <=0.5 SENSITIVE Sensitive     Inducible Clindamycin NEGATIVE Sensitive     * FEW METHICILLIN RESISTANT STAPHYLOCOCCUS AUREUS  Culture, blood (routine x 2)     Status: None   Collection Time: 08/31/18  1:30 PM  Result Value Ref Range Status   Specimen Description BLOOD RIGHT ARM  Final   Special Requests AEROBIC BOTTLE ONLY Blood Culture adequate volume  Final   Culture   Final    NO GROWTH 5 DAYS Performed at [facility], [place] [address], [place], [place] [address]    Report Status 09/05/2018 FINAL  Final  Culture, blood (routine x 2)     Status: None   Collection Time: 08/31/18  2:00 PM  Result Value Ref Range Status   Specimen Description BLOOD RIGHT ANTECUBITAL  Final   Special Requests AEROBIC BOTTLE ONLY Blood Culture adequate volume  Final   Culture   Final    NO GROWTH 5 DAYS Performed at [facility], [place] [address], [place], [place] [address]    Report Status 09/05/2018 FINAL  Final  Surgical pcr screen     Status: None   Collection Time: 09/04/18  8:59 AM  Result Value Ref Range Status   MRSA, PCR NEGATIVE NEGATIVE Final   Staphylococcus aureus NEGATIVE NEGATIVE Final    Comment: (NOTE) The Xpert SA Assay (FDA approved for NASAL specimens in patients 2 years of age and older), is one component of a comprehensive surveillance program. It is not intended to diagnose infection nor to guide or monitor treatment. Performed at [facility], [place] [address] [place], [place] [address]      Labs: CBC: Recent Labs  Lab 09/03/18 0543 09/05/18 0738  WBC 7.7 4.6  NEUTROABS 5.1 3.3  HGB 8.8* 8.5*  HCT 28.0* 27.7*  MCV 94.0 95.8  PLT 260 888   Basic Metabolic Panel: Recent Labs  Lab 09/03/18 0543 09/04/18 0713 09/05/18 0737  NA 133* 136 133*  K 3.7 3.9 3.3*  CL 95* 96* 96*  CO2 25 27 25   GLUCOSE 220* 146* 251*  BUN 43* 19 25*  CREATININE 8.13* 5.30* 5.11*  CALCIUM 8.6*  8.6* 7.7*  PHOS  --   --  3.3   Liver Function Tests: Recent Labs  Lab 09/05/18 0737  ALBUMIN 1.8*   No results for input(s): LIPASE, AMYLASE in the last 168 hours. No results for input(s): AMMONIA in the last 168 hours. Cardiac Enzymes: No results for input(s): CKTOTAL, CKMB, CKMBINDEX, TROPONINI in the last 168 hours. BNP (last 3 results) No results for input(s): BNP in the last 8760 hours. CBG: Recent Labs  Lab 09/06/18 1642 09/06/18 2047 09/07/18 0258 09/07/18 0718 09/07/18 1213  GLUCAP 344* 303* 161* 266* 290*   Time spent: 35 minutes  Signed:  Berle Mull  [facility] 09/07/2018

## 2018-09-10 ENCOUNTER — Ambulatory Visit (INDEPENDENT_AMBULATORY_CARE_PROVIDER_SITE_OTHER): Payer: Self-pay | Admitting: Orthopedic Surgery

## 2018-09-10 ENCOUNTER — Telehealth: Payer: Self-pay | Admitting: Family Medicine

## 2018-09-10 NOTE — Telephone Encounter (Signed)
keca with [facility] called to notify that patient is scheduled to begin visits tomorrow please follow up.

## 2018-09-10 NOTE — Telephone Encounter (Signed)
Will route to PCP for review. 

## 2018-09-11 ENCOUNTER — Telehealth: Payer: Self-pay | Admitting: Orthopedic Surgery

## 2018-09-11 NOTE — Telephone Encounter (Signed)
Follow up   Alvin Daniels is calling from [place], requesting a glucometer and strips for the pt be sent to [facility] on [address]. Also the pt has been experiencing some depression and low blood pressures but caregiver states that's normal for him. A med list was faxed to [place] per her request.Please f/u

## 2018-09-11 NOTE — Telephone Encounter (Signed)
Cara with [facility] called in said she needs verbal orders for nursing for 1 time a week for 3 weeks and also would like for you to let her know what would you like her to do as far as wound care after removal. [phone]

## 2018-09-14 ENCOUNTER — Other Ambulatory Visit: Payer: Self-pay

## 2018-09-14 ENCOUNTER — Ambulatory Visit (INDEPENDENT_AMBULATORY_CARE_PROVIDER_SITE_OTHER): Payer: Self-pay | Admitting: Physician Assistant

## 2018-09-14 ENCOUNTER — Telehealth: Payer: Self-pay | Admitting: Orthopedic Surgery

## 2018-09-14 ENCOUNTER — Encounter: Payer: Self-pay | Admitting: Orthopedic Surgery

## 2018-09-14 ENCOUNTER — Telehealth: Payer: Self-pay | Admitting: Pediatric Intensive Care

## 2018-09-14 VITALS — Ht 60.0 in | Wt 106.0 lb

## 2018-09-14 DIAGNOSIS — E1122 Type 2 diabetes mellitus with diabetic chronic kidney disease: Secondary | ICD-10-CM

## 2018-09-14 DIAGNOSIS — N186 End stage renal disease: Secondary | ICD-10-CM

## 2018-09-14 DIAGNOSIS — L97418 Non-pressure chronic ulcer of right heel and midfoot with other specified severity: Secondary | ICD-10-CM

## 2018-09-14 DIAGNOSIS — E43 Unspecified severe protein-calorie malnutrition: Secondary | ICD-10-CM

## 2018-09-14 DIAGNOSIS — Z89511 Acquired absence of right leg below knee: Secondary | ICD-10-CM

## 2018-09-14 DIAGNOSIS — Z89512 Acquired absence of left leg below knee: Secondary | ICD-10-CM

## 2018-09-14 DIAGNOSIS — E10621 Type 1 diabetes mellitus with foot ulcer: Secondary | ICD-10-CM

## 2018-09-14 MED ORDER — TRUE METRIX METER DEVI: 1.0000 | Freq: Three times a day (TID) | 0 refills | Status: DC

## 2018-09-14 MED ORDER — GLUCOSE BLOOD VI STRP: 1.0000 | ORAL_STRIP | Freq: Three times a day (TID) | 12 refills | Status: DC

## 2018-09-14 MED ORDER — TRUEPLUS LANCETS 28G MISC: 1.0000 | Freq: Three times a day (TID) | 11 refills | Status: DC

## 2018-09-14 NOTE — Telephone Encounter (Signed)
Via interpreter Pola Corn from [facility]- client states that he was recently discharged from hospital after amputation. He is recovering at home with care from PT and home health. He also has a caregiver, GG. Client states that he was supposed to pick up "two medicines from pharmacy" but was told by the nurse that they were too expensive. Caregiver sent text to CN during call and CN verified that medication are NEPHRO and pro-STAT sugar free 64 feeding supplements. Client has follow up with [facility] on Wednesday. Client also stated that PT told him he needed to be checking his blood sugar. CN will check with PCP. Lisette Abu RN BSN CNP [phone]

## 2018-09-14 NOTE — Progress Notes (Signed)
Office Visit Note   Patient: Alvin Daniels           Date of Birth: [birth year]           MRN: [id number] Visit Date: 09/14/2018              Requested by: [name], MD [place], [place] [address] PCP: [name], MD  Chief Complaint  Patient presents with  . Right Leg - Routine Post Op    09/04/18 right BKA  No drainage in canister [employer] prosthetic Photo obtained.       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 He reports no problems and pain under good control since his discharge. He comes in today with family who act as Spanish interpretor.   Assessment & Plan: Visit Diagnoses:  1. Acquired absence of right leg below knee ([place])   2. Acquired absence of left leg below knee ([name])   3. 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 Diabetes mellitus with ESRD (end-stage renal disease) ([place])   5. Severe protein-calorie malnutrition ([place])     Plan: Prevena VAC dressing was removed. Dry dressing to the area and Ace wrap and apply stump shrinker once obtained directly over the incisional area. Patient was given prescription for stump shrinker stockings from [facility].  Follow up in 1 week.   Follow-Up Instructions: Return in about 1 week (around 09/21/2018).   Ortho Exam  Patient is alert, oriented, no adenopathy, well-dressed, normal affect, normal respiratory effort. The right transtibial amputation site incision is intact and without signs of infection or cellulitis.  Good knee extension and flexion. Staples intact.   Imaging: No results found.   Labs: Lab Results  Component Value Date   HGBA1C 9.0 (H) 06/18/2018   HGBA1C 13.4 (H) 11/19/2017   HGBA1C 5.0 05/24/2016   ESRSEDRATE 126 (H) 08/29/2018   ESRSEDRATE 132 (H) 08/15/2016   ESRSEDRATE 84 (H) 06/10/2014   CRP 29.8  (H) 08/29/2018   CRP 28.3 (H) 08/15/2016   CRP 17.8 (H) 04/18/2014   REPTSTATUS 09/05/2018 FINAL 08/31/2018   GRAMSTAIN  08/30/2018    FEW WBC PRESENT, PREDOMINANTLY PMN NO ORGANISMS SEEN Performed at [facility] [address] [place], [place] [address]    CULT  08/31/2018    NO GROWTH 5 DAYS Performed at [facility] [address] [place], [place] [address]    LABORGA METHICILLIN RESISTANT STAPHYLOCOCCUS AUREUS 08/30/2018     Lab Results  Component Value Date   ALBUMIN 1.8 (L) 09/05/2018   ALBUMIN 2.3 (L) 08/29/2018   ALBUMIN 2.5 (L) 08/27/2018   PREALBUMIN 9.4 (L) 08/29/2018    Body mass index is 20.7 kg/m.  Orders:  No orders of the defined types were placed in this encounter.  No orders of the defined types were placed in this encounter.    Procedures: No procedures performed  Clinical Data: No additional findings.  ROS:  All other systems negative, except as noted in the HPI. Review of Systems  Objective: Vital Signs: Ht 5' (1.524 m)   Wt 106 lb (48.1 kg)   BMI 20.70 kg/m   Specialty Comments:  No specialty comments available.  PMFS History: Patient Active Problem List   Diagnosis Date Noted  . Abscess of foot   . Abscess of ankle  08/29/2018  . Charcot foot due to diabetes mellitus ([place]) 07/23/2018  . MRSA bacteremia   . Diabetic foot ulcer ([place]) 11/19/2017  . Femur fracture, left ([place]) 08/16/2016  . HIV (human immunodeficiency virus infection) ([place]) 08/16/2016  . GERD (gastroesophageal reflux disease) 05/24/2016  . Burn 04/10/2016  . Pressure ulcer of BKA stump, stage 2 ([place]) 11/22/2014  . Chronic hepatitis C without hepatic coma ([place]) 11/22/2014  . Acquired absence of left leg below knee ([place]) 06/17/2014  . HIV disease ([place])   . Poor dentition 10/25/2013  . ESRD on dialysis ([place]) 10/25/2013  . Vision changes 06/21/2013  . Orthostatic headache 04/15/2013  . Anemia of chronic renal failure 06/19/2012  . Metabolic bone  disease 01/75/1025  . Chronic diarrhea 06/17/2012  . Severe protein-calorie malnutrition ([place]) 06/17/2012  . Normocytic anemia 06/17/2012  . Polyneuropathy in diabetes(357.2) 06/27/2010  . Diabetes mellitus with ESRD (end-stage renal disease) ([place]) 01/03/2010   Past Medical History:  Diagnosis Date  . AIDS ([place]) 11/22/2014  . Chronic diarrhea   . Chronic hepatitis C without hepatic coma ([place]) 11/22/2014  . 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 Diabetic neuropathy ([place])   . ESRD (end stage renal disease) on dialysis [facility])    "TTS; don't remember street name" (05/03/2014)  . Hepatitis C   . HIV INFECTION 06/27/2010   Qualifier: Diagnosis of  By: Nickola Major CMA ( [place]), Geni Bers    . Hypotension 06/02/2012  . Metabolic bone disease 12/15/2776  . MRSA infection   . Normocytic anemia 06/17/2012  . Pancreatitis   . Pressure ulcer of BKA stump, stage 2 ([place]) 11/22/2014  . Renal disorder   . Severe protein-calorie malnutrition ([place]) 06/17/2012  . Uncontrolled diabetes mellitus with complications ([place]) 2/42/3536   Annotation: uncontrolled Qualifier: Diagnosis of  By: Nickola Major CMA ( AAMA), Geni Bers      Family History  Problem Relation Age of Onset  . Diabetes Mother   . Diabetes Father     Past Surgical History:  Procedure Laterality Date  . AMPUTATION Left 04/20/2014   Procedure: 3rd toe amputation, 4th Toe Amputation,  5th Toe Amputation;  Surgeon: Newt Minion, MD;  Location: [facility];  Service: Orthopedics;  Laterality: Left;  . AMPUTATION Left 05/02/2014   Procedure: Midfoot Amputation;  Surgeon: Newt Minion, MD;  Location: [facility];  Service: Orthopedics;  Laterality: Left;  . AMPUTATION Left 06/17/2014   Procedure: AMPUTATION BELOW KNEE;  Surgeon: Newt Minion, MD;  Location: [facility];  Service: Orthopedics;  Laterality: Left;  . AMPUTATION Right 09/04/2018   Procedure: RIGHT BELOW KNEE AMPUTATION;  Surgeon: Newt Minion, MD;  Location: [facility];  Service: Orthopedics;   Laterality: Right;  RIGHT BELOW KNEE AMPUTATION  . AV FISTULA PLACEMENT Left   . AV FISTULA PLACEMENT Left 05/10/2016   Procedure: Creation Left Arm Brachiocephalic Arteriovenous Fistula and Ligation of Radiocephalic Fistula;  Surgeon: Angelia Mould, MD;  Location: [facility];  Service: Vascular;  Laterality: Left;  . FEMUR IM NAIL Left 08/17/2016   Procedure: INTRAMEDULLARY (IM) RETROGRADE FEMORAL NAILING;  Surgeon: Marybelle Killings, MD;  Location: [facility];  Service: Orthopedics;  Laterality: Left;  . FOOT AMPUTATION THROUGH ANKLE Left 12/'21/2015   midfoot  . IR GENERIC HISTORICAL Left 05/01/2016   IR THROMBECTOMY AV FISTULA W/THROMBOLYSIS/PTA INC/SHUNT/IMG LEFT 05/01/2016 [name], MD [facility]  . IR GENERIC HISTORICAL  05/01/2016   IR US GUIDE VASC ACCESS LEFT 05/01/2016 [name], MD [facility]  .  IR GENERIC HISTORICAL  05/07/2016   IR FLUORO GUIDE CV LINE RIGHT 05/07/2016 Corrie Mckusick, DO [facility]  . IR GENERIC HISTORICAL  05/07/2016   IR US GUIDE VASC ACCESS RIGHT 05/07/2016 Corrie Mckusick, DO [facility]  . IR GENERIC HISTORICAL  05/22/2016   IR US GUIDE VASC ACCESS RIGHT 05/22/2016 Greggory Keen, MD [facility]  . IR GENERIC HISTORICAL  05/22/2016   IR FLUORO GUIDE CV LINE RIGHT 05/22/2016 Greggory Keen, MD [facility]  . IR REMOVAL TUN CV CATH W/O FL  08/21/2016  . PERIPHERAL VASCULAR CATHETERIZATION Left 05/09/2016   Procedure: A/V Fistulagram;  Surgeon: Angelia Mould, MD;  Location: [facility];  Service: Cardiovascular;  Laterality: Left;  arm  . TEE WITHOUT CARDIOVERSION N/A 06/22/2018   Procedure: TRANSESOPHAGEAL ECHOCARDIOGRAM (TEE);  Surgeon: Jerline Pain, MD;  Location: [facility] END[facility]COPY;  Service: Cardiovascular;  Laterality: N/A;   Social History   Occupational History  . Not on file  Tobacco Use  . Smoking status: Never Smoker  . Smokeless tobacco: Never Used  Substance and Sexual Activity  . Alcohol use: No  . Drug use: No  .  Sexual activity: Never

## 2018-09-14 NOTE — Telephone Encounter (Signed)
Cara/[facility]/RN called needs verbal ordrer  1 week for 3 week and wound care [phone]

## 2018-09-14 NOTE — Telephone Encounter (Signed)
Meter has been sent to requested pharmacy. Will set patient up for a televisit for other concerns.

## 2018-09-14 NOTE — Telephone Encounter (Signed)
Eric called requesting verbal orders for pt for 2 times a week for 3 weeks  Randall Hiss # [phone]

## 2018-09-15 ENCOUNTER — Telehealth: Payer: Self-pay

## 2018-09-15 ENCOUNTER — Other Ambulatory Visit: Payer: Self-pay

## 2018-09-15 MED ORDER — GLUCOSE BLOOD VI STRP: 1.0000 | ORAL_STRIP | Freq: Three times a day (TID) | 12 refills | Status: DC

## 2018-09-15 MED ORDER — TRUE METRIX METER DEVI: 1.0000 | Freq: Three times a day (TID) | 0 refills | Status: DC

## 2018-09-15 MED ORDER — TRUEPLUS LANCETS 28G MISC: 1.0000 | Freq: Three times a day (TID) | 11 refills | Status: DC

## 2018-09-15 NOTE — Telephone Encounter (Signed)
This has been done see previous message.

## 2018-09-15 NOTE — Telephone Encounter (Signed)
Called and sw HHN to advise that the pt was in the office yesterday and that he is to apply shrinker no dressing required he was given an order to pick this up yesterday. She states that [name] will go out this Friday for DM education and blood sugar checks and then once more next week to make sure ok with shrinker application and if anything additional needed at that point will call to advise. She also took the verbal ok for physical therapy. Will call with any ither questions.

## 2018-09-15 NOTE — Telephone Encounter (Signed)
Call received from Lisette Abu, [place] requesting patient's order for glucometer be sent to [facility] as he has no insurance.  Message sent to Doloris Hall, CMA with this request

## 2018-09-15 NOTE — Telephone Encounter (Signed)
done

## 2018-09-16 ENCOUNTER — Inpatient Hospital Stay: Payer: Self-pay | Admitting: Family Medicine

## 2018-09-21 ENCOUNTER — Telehealth: Payer: Self-pay | Admitting: Orthopedic Surgery

## 2018-09-21 NOTE — Telephone Encounter (Signed)
Alvin Daniels with [facility] called for verbal orders for the pt.  Twice a week for three weeks.  Call back # [phone]

## 2018-09-21 NOTE — Telephone Encounter (Signed)
Pt is s/p a right BKA. Called and advised verbal ok for orders below. LM ON VM to call with questions.

## 2018-09-28 ENCOUNTER — Encounter: Payer: Self-pay | Admitting: Family Medicine

## 2018-09-28 ENCOUNTER — Encounter: Payer: Self-pay | Admitting: Orthopedic Surgery

## 2018-09-28 ENCOUNTER — Ambulatory Visit: Payer: Self-pay | Attending: Family Medicine | Admitting: Family Medicine

## 2018-09-28 ENCOUNTER — Other Ambulatory Visit: Payer: Self-pay

## 2018-09-28 ENCOUNTER — Ambulatory Visit (INDEPENDENT_AMBULATORY_CARE_PROVIDER_SITE_OTHER): Payer: Self-pay | Admitting: Orthopedic Surgery

## 2018-09-28 VITALS — Ht 60.0 in | Wt 106.0 lb

## 2018-09-28 VITALS — BP 161/85 | HR 93 | Temp 97.5°F

## 2018-09-28 DIAGNOSIS — B20 Human immunodeficiency virus [HIV] disease: Secondary | ICD-10-CM

## 2018-09-28 DIAGNOSIS — Z89511 Acquired absence of right leg below knee: Secondary | ICD-10-CM

## 2018-09-28 DIAGNOSIS — Z89512 Acquired absence of left leg below knee: Secondary | ICD-10-CM

## 2018-09-28 DIAGNOSIS — E10621 Type 1 diabetes mellitus with foot ulcer: Secondary | ICD-10-CM

## 2018-09-28 DIAGNOSIS — N186 End stage renal disease: Secondary | ICD-10-CM

## 2018-09-28 DIAGNOSIS — L97418 Non-pressure chronic ulcer of right heel and midfoot with other specified severity: Secondary | ICD-10-CM

## 2018-09-28 DIAGNOSIS — E1122 Type 2 diabetes mellitus with diabetic chronic kidney disease: Secondary | ICD-10-CM

## 2018-09-28 LAB — GLUCOSE, POCT (MANUAL RESULT ENTRY): POC Glucose: 367 mg/dl — AB (ref 70–99)

## 2018-09-28 LAB — POCT GLYCOSYLATED HEMOGLOBIN (HGB A1C): HbA1c, POC (controlled diabetic range): 6.8 % (ref 0.0–7.0)

## 2018-09-28 MED ORDER — "INSULIN SYRINGE-NEEDLE U-100 31G X 15/64"" 0.5 ML MISC": 1.0000 | Freq: Two times a day (BID) | 5 refills | Status: DC

## 2018-09-28 MED ORDER — INSULIN NPH ISOPHANE & REGULAR (70-30) 100 UNIT/ML ~~LOC~~ SUSP: 15.0000 [IU] | Freq: Two times a day (BID) | SUBCUTANEOUS | 6 refills | Status: DC

## 2018-09-28 NOTE — Progress Notes (Signed)
Office Visit Note   Patient: Alvin Daniels           Date of Birth: [birth year]           MRN: [id number] Visit Date: 09/28/2018              Requested by: [name], MD [place], [place] [address] PCP: [name], MD  Chief Complaint  Patient presents with  . Right Leg - Routine Post Op    09/04/18 right BKA       HPI: The patient is a 48 year old gentleman who is seen for postoperative follow-up following right transtibial amputation on 09/04/2018.  He reports no problems with the healing.  He is wearing a medical shrinker stocking directly in contact with the incision.  He has been working with [facility] clinic for his previous prosthetic.  He reports no pain over the residual limb. He is seen with the assistance of a Spanish interpreter today. Assessment & Plan: Visit Diagnoses:  1. Acquired absence of right leg below knee ([place])   2. Acquired absence of left leg below knee ([name])   3. 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 Diabetes mellitus with ESRD (end-stage renal disease) ([place])     Plan: Staples were removed today.  The patient should apply his shrinker stocking directly over the residual limb daily after cleaning with soap and water. He was given a prescription for a K3 prosthesis to be fabricated by [facility].  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  He needs to be able to change speeds while walking in public places and also be able to walk on uneven surfaces such as grass, gravel, curbs, ramps, stairs and bleachers.  Follow-Up Instructions: Return in about 3 weeks (around 10/19/2018).   Ortho Exam  Patient is alert, oriented, no adenopathy, well-dressed, normal affect, normal respiratory effort. The right transtibial amputation site is  healing well.  Staples are to be removed today.  There are no signs of cellulitis or infection.  There is very little residual edema.  He is wearing a shrinker stocking directly over the incision.  He has full knee extension and good flexion.  He is a functional prosthetic user on the left lower extremity already and feels he is ready for prosthesis on the right. Imaging: No results found.   Labs: Lab Results  Component Value Date   HGBA1C 9.0 (H) 06/18/2018   HGBA1C 13.4 (H) 11/19/2017   HGBA1C 5.0 05/24/2016   ESRSEDRATE 126 (H) 08/29/2018   ESRSEDRATE 132 (H) 08/15/2016   ESRSEDRATE 84 (H) 06/10/2014   CRP 29.8 (H) 08/29/2018   CRP 28.3 (H) 08/15/2016   CRP 17.8 (H) 04/18/2014   REPTSTATUS 09/05/2018 FINAL 08/31/2018   GRAMSTAIN  08/30/2018    FEW WBC PRESENT, PREDOMINANTLY PMN NO ORGANISMS SEEN Performed at [facility] [address] [place], [place] [address]    CULT  08/31/2018    NO GROWTH 5 DAYS Performed at [facility] [address], [place],  [address]    LABORGA METHICILLIN RESISTANT STAPHYLOCOCCUS AUREUS 08/30/2018     Lab Results  Component Value Date   ALBUMIN 1.8 (L) 09/05/2018   ALBUMIN 2.3 (L) 08/29/2018   ALBUMIN 2.5 (L) 08/27/2018   PREALBUMIN 9.4 (L) 08/29/2018  Body mass index is 20.7 kg/m.  Orders:  No orders of the defined types were placed in this encounter.  No orders of the defined types were placed in this encounter.    Procedures: No procedures performed  Clinical Data: No additional findings.  ROS:  All other systems negative, except as noted in the HPI. Review of Systems  Objective: Vital Signs: Ht 5' (1.524 m)   Wt 106 lb (48.1 kg)   BMI 20.70 kg/m   Specialty Comments:  No specialty comments available.  PMFS History: Patient Active Problem List   Diagnosis Date Noted  . Abscess of foot   . Abscess of ankle 08/29/2018  . Charcot foot due to diabetes mellitus ([place]) 07/23/2018  . MRSA  bacteremia   . Diabetic foot ulcer ([place]) 11/19/2017  . Femur fracture, left ([place]) 08/16/2016  . HIV (human immunodeficiency virus infection) ([place]) 08/16/2016  . GERD (gastroesophageal reflux disease) 05/24/2016  . Burn 04/10/2016  . Pressure ulcer of BKA stump, stage 2 ([place]) 11/22/2014  . Chronic hepatitis C without hepatic coma ([place]) 11/22/2014  . Acquired absence of left leg below knee ([place]) 06/17/2014  . HIV disease ([place])   . Poor dentition 10/25/2013  . ESRD on dialysis ([place]) 10/25/2013  . Vision changes 06/21/2013  . Orthostatic headache 04/15/2013  . Anemia of chronic renal failure 06/19/2012  . Metabolic bone disease 78/24/2353  . Chronic diarrhea 06/17/2012  . Severe protein-calorie malnutrition ([place]) 06/17/2012  . Normocytic anemia 06/17/2012  . Polyneuropathy in diabetes(357.2) 06/27/2010  . Diabetes mellitus with ESRD (end-stage renal disease) ([place]) 01/03/2010   Past Medical History:  Diagnosis Date  . AIDS ([place]) 11/22/2014  . Chronic diarrhea   . Chronic hepatitis C without hepatic coma ([place]) 11/22/2014  . 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 Diabetic neuropathy ([place])   . ESRD (end stage renal disease) on dialysis [facility])    "TTS; don't remember street name" (05/03/2014)  . Hepatitis C   . HIV INFECTION 06/27/2010   Qualifier: Diagnosis of  By: Nickola Major CMA ( [place]), Geni Bers    . Hypotension 06/02/2012  . Metabolic bone disease 10/11/4429  . MRSA infection   . Normocytic anemia 06/17/2012  . Pancreatitis   . Pressure ulcer of BKA stump, stage 2 ([place]) 11/22/2014  . Renal disorder   . Severe protein-calorie malnutrition ([place]) 06/17/2012  . Uncontrolled diabetes mellitus with complications ([place]) 5/40/0867   Annotation: uncontrolled Qualifier: Diagnosis of  By: Nickola Major CMA ( AAMA), Geni Bers      Family History  Problem Relation Age of Onset  . Diabetes Mother   . Diabetes Father     Past Surgical History:  Procedure Laterality Date  . AMPUTATION Left  04/20/2014   Procedure: 3rd toe amputation, 4th Toe Amputation,  5th Toe Amputation;  Surgeon: Newt Minion, MD;  Location: [facility];  Service: Orthopedics;  Laterality: Left;  . AMPUTATION Left 05/02/2014   Procedure: Midfoot Amputation;  Surgeon: Newt Minion, MD;  Location: [facility];  Service: Orthopedics;  Laterality: Left;  . AMPUTATION Left 06/17/2014   Procedure: AMPUTATION BELOW KNEE;  Surgeon: Newt Minion, MD;  Location: [facility];  Service: Orthopedics;  Laterality: Left;  . AMPUTATION Right 09/04/2018   Procedure: RIGHT BELOW KNEE AMPUTATION;  Surgeon: Newt Minion, MD;  Location: [facility];  Service: Orthopedics;  Laterality: Right;  RIGHT BELOW KNEE AMPUTATION  . AV FISTULA PLACEMENT Left   . AV FISTULA PLACEMENT Left 05/10/2016  Procedure: Creation Left Arm Brachiocephalic Arteriovenous Fistula and Ligation of Radiocephalic Fistula;  Surgeon: Angelia Mould, MD;  Location: [facility];  Service: Vascular;  Laterality: Left;  . FEMUR IM NAIL Left 08/17/2016   Procedure: INTRAMEDULLARY (IM) RETROGRADE FEMORAL NAILING;  Surgeon: Marybelle Killings, MD;  Location: [facility];  Service: Orthopedics;  Laterality: Left;  . FOOT AMPUTATION THROUGH ANKLE Left 12/'21/2015   midfoot  . IR GENERIC HISTORICAL Left 05/01/2016   IR THROMBECTOMY AV FISTULA W/THROMBOLYSIS/PTA INC/SHUNT/IMG LEFT 05/01/2016 [name], MD [facility]  . IR GENERIC HISTORICAL  05/01/2016   IR US GUIDE VASC ACCESS LEFT 05/01/2016 [name], MD [facility]  . IR GENERIC HISTORICAL  05/07/2016   IR FLUORO GUIDE CV LINE RIGHT 05/07/2016 Corrie Mckusick, DO [facility]  . IR GENERIC HISTORICAL  05/07/2016   IR US GUIDE VASC ACCESS RIGHT 05/07/2016 Corrie Mckusick, DO [facility]  . IR GENERIC HISTORICAL  05/22/2016   IR US GUIDE VASC ACCESS RIGHT 05/22/2016 Greggory Keen, MD [facility]  . IR GENERIC HISTORICAL  05/22/2016   IR FLUORO GUIDE CV LINE RIGHT 05/22/2016 Greggory Keen, MD [facility]  . IR REMOVAL TUN CV CATH W/O FL   08/21/2016  . PERIPHERAL VASCULAR CATHETERIZATION Left 05/09/2016   Procedure: A/V Fistulagram;  Surgeon: Angelia Mould, MD;  Location: [place] [facility];  Service: Cardiovascular;  Laterality: Left;  arm  . TEE WITHOUT CARDIOVERSION N/A 06/22/2018   Procedure: TRANSESOPHAGEAL ECHOCARDIOGRAM (TEE);  Surgeon: Jerline Pain, MD;  Location: [facility] ENDOSCOPY;  Service: Cardiovascular;  Laterality: N/A;   Social History   Occupational History  . Not on file  Tobacco Use  . Smoking status: Never Smoker  . Smokeless tobacco: Never Used  Substance and Sexual Activity  . Alcohol use: No  . Drug use: No  . Sexual activity: Never

## 2018-09-28 NOTE — Progress Notes (Signed)
Subjective:  Patient ID: Alvin Daniels, male    DOB: [birth year]  Age: 48 y.o. MRN: [id number]  CC: Diabetes   HPI Alvin Daniels is a 48 year old male with a history of type 2 diabetes mellitus ( A1c 6.8), left BKA (in 06/2014) HIV 1 (HIV quant < 20 in 08/2018), hepatitis C, end-stage renal disease on hemodialysis (Tuesday Thursday and Saturday) with recent right BKA in 08/2018. He had his visit with his orthopedic Dr. Sharol Given earlier today at which time staples from his right stump were removed.  He is currently awaiting prosthesis from [employer]. He reports doing well and has no concerns today.  With regards to his diabetes mellitus he denies hypoglycemia and is compliant with 15 units of NPH twice daily.  He denies hypoglycemic episodes. His hemodialysis sessions are going well.  His last visit with infectious disease was on 07/2018 and he is doing well on his antiretroviral therapy. He was prescribed a nutritional supplement on discharge from the hospital which he was unable to obtain due to lack of medical coverage and inability to afford this out of pocket.  Past Medical History:  Diagnosis Date  . AIDS ([place]) 11/22/2014  . Chronic diarrhea   . Chronic hepatitis C without hepatic coma ([place]) 11/22/2014  . 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 Diabetic neuropathy ([place])   . ESRD (end stage renal disease) on dialysis [facility])    "TTS; don't remember street name" (05/03/2014)  . Hepatitis C   . HIV INFECTION 06/27/2010   Qualifier: Diagnosis of  By: Nickola Major CMA ( [place]), Geni Bers    . Hypotension 06/02/2012  . Metabolic bone disease 07/19/298  . MRSA infection   . Normocytic anemia 06/17/2012  . Pancreatitis   . Pressure ulcer of BKA stump, stage 2 ([place]) 11/22/2014  . Renal disorder   . Severe protein-calorie malnutrition ([place]) 06/17/2012  . Uncontrolled diabetes mellitus with complications ([place]) 02/02/3006   Annotation: uncontrolled Qualifier: Diagnosis of  By: Nickola Major  CMA Deborra Medina), Geni Bers      Past Surgical History:  Procedure Laterality Date  . AMPUTATION Left 04/20/2014   Procedure: 3rd toe amputation, 4th Toe Amputation,  5th Toe Amputation;  Surgeon: Newt Minion, MD;  Location: [place];  Service: Orthopedics;  Laterality: Left;  . AMPUTATION Left 05/02/2014   Procedure: Midfoot Amputation;  Surgeon: Newt Minion, MD;  Location: [place];  Service: Orthopedics;  Laterality: Left;  . AMPUTATION Left 06/17/2014   Procedure: AMPUTATION BELOW KNEE;  Surgeon: Newt Minion, MD;  Location: [place];  Service: Orthopedics;  Laterality: Left;  . AMPUTATION Right 09/04/2018   Procedure: RIGHT BELOW KNEE AMPUTATION;  Surgeon: Newt Minion, MD;  Location: [place];  Service: Orthopedics;  Laterality: Right;  RIGHT BELOW KNEE AMPUTATION  . AV FISTULA PLACEMENT Left   . AV FISTULA PLACEMENT Left 05/10/2016   Procedure: Creation Left Arm Brachiocephalic Arteriovenous Fistula and Ligation of Radiocephalic Fistula;  Surgeon: Angelia Mould, MD;  Location: [place];  Service: Vascular;  Laterality: Left;  . FEMUR IM NAIL Left 08/17/2016   Procedure: INTRAMEDULLARY (IM) RETROGRADE FEMORAL NAILING;  Surgeon: Marybelle Killings, MD;  Location: [place];  Service: Orthopedics;  Laterality: Left;  . FOOT AMPUTATION THROUGH ANKLE Left 12/'21/2015   midfoot  . IR GENERIC HISTORICAL Left 05/01/2016   IR THROMBECTOMY AV FISTULA W/THROMBOLYSIS/PTA INC/SHUNT/IMG LEFT 05/01/2016 [name], MD [facility]  . IR GENERIC HISTORICAL  05/01/2016   IR  US GUIDE VASC ACCESS LEFT 05/01/2016 [name], MD [facility]  . IR GENERIC HISTORICAL  05/07/2016   IR FLUORO GUIDE CV LINE RIGHT 05/07/2016 Corrie Mckusick, DO [facility]  . IR GENERIC HISTORICAL  05/07/2016   IR US GUIDE VASC ACCESS RIGHT 05/07/2016 Corrie Mckusick, DO [facility]  . IR GENERIC HISTORICAL  05/22/2016   IR US GUIDE VASC ACCESS RIGHT 05/22/2016 Greggory Keen, MD [facility]  . IR GENERIC HISTORICAL  05/22/2016   IR  FLUORO GUIDE CV LINE RIGHT 05/22/2016 Greggory Keen, MD [facility]  . IR REMOVAL TUN CV CATH W/O FL  08/21/2016  . PERIPHERAL VASCULAR CATHETERIZATION Left 05/09/2016   Procedure: A/V Fistulagram;  Surgeon: Angelia Mould, MD;  Location: [place] CV LAB;  Service: Cardiovascular;  Laterality: Left;  arm  . TEE WITHOUT CARDIOVERSION N/A 06/22/2018   Procedure: TRANSESOPHAGEAL ECHOCARDIOGRAM (TEE);  Surgeon: Jerline Pain, MD;  Location: [facility] END[facility]COPY;  Service: Cardiovascular;  Laterality: N/A;    Family History  Problem Relation Age of Onset  . Diabetes Mother   . Diabetes Father     No Known Allergies  Outpatient Medications Prior to Visit  Medication Sig Dispense Refill  . acetaminophen (TYLENOL) 325 MG tablet Take 2 tablets (650 mg total) by mouth every 6 (six) hours as needed for mild pain.    . Amino Acids-Protein Hydrolys (FEEDING SUPPLEMENT, PRO-STAT SUGAR FREE 64,) LIQD Take 30 mLs by mouth 2 (two) times daily. 887 mL 0  . bictegravir-emtricitabine-tenofovir AF (BIKTARVY) 50-200-25 MG TABS tablet Take 1 tablet by mouth daily. 30 tablet 11  . Blood Glucose Monitoring Suppl (TRUE METRIX METER) DEVI 1 each by Does not apply route 3 (three) times daily. 1 Device 0  . docusate sodium (COLACE) 100 MG capsule Take 1 capsule (100 mg total) by mouth 2 (two) times daily as needed for mild constipation. 10 capsule 0  . glucose blood (TRUE METRIX BLOOD GLUCOSE TEST) test strip 1 each by Other route 3 (three) times daily. 100 each 12  . midodrine (PROAMATINE) 10 MG tablet Take 1 tablet (10 mg total) by mouth Every Tuesday,Thursday,and Saturday with dialysis. 15 tablet 0  . Nutritional Supplements (FEEDING SUPPLEMENT, NEPRO CARB STEADY,) LIQD Take 237 mLs by mouth 2 (two) times daily between meals. 60 Can 0  . TRUEplus Lancets 28G MISC 1 each by Does not apply route 3 (three) times daily. 100 each 11  . insulin NPH-regular Human (70-30) 100 UNIT/ML injection Inject 10-15 Units into the  skin See admin instructions. Inject 15 units subcutaneous in the a.m. and 10 units subcutaneous in the p.m. with meals      No facility-administered medications prior to visit.      ROS Review of Systems  Constitutional: Negative for activity change and appetite change.  HENT: Negative for sinus pressure and sore throat.   Eyes: Negative for visual disturbance.  Respiratory: Negative for cough, chest tightness and shortness of breath.   Cardiovascular: Negative for chest pain and leg swelling.  Gastrointestinal: Negative for abdominal distention, abdominal pain, constipation and diarrhea.  Endocrine: Negative.   Genitourinary: Negative for dysuria.  Musculoskeletal: Negative for joint swelling and myalgias.  Skin: Negative for rash.  Allergic/Immunologic: Negative.   Neurological: Negative for weakness, light-headedness and numbness.  Psychiatric/Behavioral: Negative for dysphoric mood and suicidal ideas.    Objective:  BP (!) 161/85   Pulse 93   Temp (!) 97.5 F (36.4 C) (Oral)   SpO2 100%   BP/Weight 09/28/2018  9/74/1638 08/15/3644  Systolic BP 803 - -  Diastolic BP 85 - -  Wt. (Lbs) - 106 106  BMI - 20.7 20.7      Physical Exam Constitutional:      Appearance: He is well-developed.  Cardiovascular:     Rate and Rhythm: Normal rate.     Heart sounds: Normal heart sounds. No murmur.  Pulmonary:     Effort: Pulmonary effort is normal.     Breath sounds: Normal breath sounds. No wheezing or rales.  Chest:     Chest wall: No tenderness.  Abdominal:     General: Bowel sounds are normal. There is no distension.     Palpations: Abdomen is soft. There is no mass.     Tenderness: There is no abdominal tenderness.  Musculoskeletal:     Comments: Bilateral BKA Left prosthesis in place  Neurological:     Mental Status: He is alert and oriented to person, place, and time.  Psychiatric:        Mood and Affect: Mood normal.     CMP Latest Ref Rng & Units 09/05/2018  09/04/2018 09/03/2018  Glucose 70 - 99 mg/dL 251(H) 146(H) 220(H)  BUN 6 - 20 mg/dL 25(H) 19 43(H)  Creatinine 0.61 - 1.24 mg/dL 5.11(H) 5.30(H) 8.13(H)  Sodium 135 - 145 mmol/L 133(L) 136 133(L)  Potassium 3.5 - 5.1 mmol/L 3.3(L) 3.9 3.7  Chloride 98 - 111 mmol/L 96(L) 96(L) 95(L)  CO2 22 - 32 mmol/L 25 27 25   Calcium 8.9 - 10.3 mg/dL 7.7(L) 8.6(L) 8.6(L)  Total Protein 6.5 - 8.1 g/dL - - -  Total Bilirubin 0.3 - 1.2 mg/dL - - -  Alkaline Phos 38 - 126 U/L - - -  AST 15 - 41 U/L - - -  ALT 0 - 44 U/L - - -    Lipid Panel     Component Value Date/Time   CHOL 81 06/13/2014 1406   TRIG 67 06/13/2014 1406   HDL 30 (L) 06/13/2014 1406   CHOLHDL 2.7 06/13/2014 1406   VLDL 13 06/13/2014 1406   LDLCALC 38 06/13/2014 1406    CBC    Component Value Date/Time   WBC 4.6 09/05/2018 0738   RBC 2.89 (L) 09/05/2018 0738   HGB 8.5 (L) 09/05/2018 0738   HCT 27.7 (L) 09/05/2018 0738   PLT 262 09/05/2018 0738   MCV 95.8 09/05/2018 0738   MCH 29.4 09/05/2018 0738   MCHC 30.7 09/05/2018 0738   RDW 15.7 (H) 09/05/2018 0738   LYMPHSABS 0.8 09/05/2018 0738   MONOABS 0.2 09/05/2018 0738   EOSABS 0.1 09/05/2018 0738   BASOSABS 0.0 09/05/2018 0738    Lab Results  Component Value Date   HGBA1C 6.8 09/28/2018    Lab Results  Component Value Date   CD4TCELL 12 (L) 08/31/2018   CD4TABS 100 (L) 08/31/2018   Lab Results  Component Value Date   HIV1RNAQUANT <20 08/31/2018    Assessment & Plan:   1. Diabetes mellitus with ESRD (end-stage renal disease) ([place]) Controlled with A1c of 6.8 Continue current regimen Diabetic diet, lifestyle modifications - POCT glucose (manual entry) - POCT glycosylated hemoglobin (Hb A1C) - insulin NPH-regular Human (70-30) 100 UNIT/ML injection; Inject 15 Units into the skin 2 (two) times daily with a meal.  Dispense: 30 mL; Refill: 6 - Insulin Syringe-Needle U-100 (BD INSULIN SYRINGE ULTRAFINE) 31G X 15/64" 0.5 ML MISC; 1 each by Does not apply route 2  (two) times daily. Please dispense needles as  well.  Dispense: 60 each; Refill: 5  2. HIV infection, unspecified symptom status ([place]) Undetectable viral load Continue with antiretroviral therapy  3. S/P bilateral BKA (below knee amputation) ([place]) Doing well with left prosthesis Right stump is healing well-followed by orthopedics Awaiting right prosthesis.   Meds ordered this encounter  Medications  . insulin NPH-regular Human (70-30) 100 UNIT/ML injection    Sig: Inject 15 Units into the skin 2 (two) times daily with a meal.    Dispense:  30 mL    Refill:  6  . Insulin Syringe-Needle U-100 (BD INSULIN SYRINGE ULTRAFINE) 31G X 15/64" 0.5 ML MISC    Sig: 1 each by Does not apply route 2 (two) times daily. Please dispense needles as well.    Dispense:  60 each    Refill:  5    Follow-up: Return in about 3 months (around 12/29/2018) for medical conditions.       [name], MD, FAAFP. [facility] [place], [place]   09/28/2018, 2:52 PM

## 2018-10-13 ENCOUNTER — Other Ambulatory Visit: Payer: Self-pay | Admitting: Internal Medicine

## 2018-10-13 ENCOUNTER — Other Ambulatory Visit: Payer: Self-pay

## 2018-10-13 DIAGNOSIS — B20 Human immunodeficiency virus [HIV] disease: Secondary | ICD-10-CM

## 2018-10-13 MED ORDER — BICTEGRAVIR-EMTRICITAB-TENOFOV 50-200-25 MG PO TABS: 1.0000 | ORAL_TABLET | Freq: Every day | ORAL | 3 refills | Status: DC

## 2018-10-19 ENCOUNTER — Other Ambulatory Visit: Payer: Self-pay

## 2018-10-19 ENCOUNTER — Ambulatory Visit (INDEPENDENT_AMBULATORY_CARE_PROVIDER_SITE_OTHER): Payer: Self-pay | Admitting: Orthopedic Surgery

## 2018-10-19 ENCOUNTER — Encounter: Payer: Self-pay | Admitting: Orthopedic Surgery

## 2018-10-19 VITALS — Ht 60.0 in | Wt 106.0 lb

## 2018-10-19 DIAGNOSIS — Z89512 Acquired absence of left leg below knee: Secondary | ICD-10-CM

## 2018-10-19 DIAGNOSIS — Z89511 Acquired absence of right leg below knee: Secondary | ICD-10-CM

## 2018-10-19 NOTE — Progress Notes (Signed)
Office Visit Note   Patient: Alvin Daniels           Date of Birth: Sep 20, 1970           MRN: [id number] Visit Date: 10/19/2018              Requested by: [name], MD [place], [place] [id number] PCP: [name], MD  Chief Complaint  Patient presents with  . Right Leg - Routine Post Op    09/04/2018 BKA right      HPI: Patient is a 48 year old gentleman who is 6 weeks status post right transtibial amputation.  He is also status post a left transtibial amputation and is currently wearing a prosthesis on the left.  Patient is currently working with [employer].  Assessment & Plan: Visit Diagnoses:  1. Acquired absence of right leg below knee ([place])   2. Acquired absence of left leg below knee [facility])     Plan: Patient will follow-up with [facility] for prosthetic fitting his leg is well consolidated.  Follow-Up Instructions: Return in about 2 months (around 12/19/2018).   Ortho Exam  Patient is alert, oriented, no adenopathy, well-dressed, normal affect, normal respiratory effort. Examination patient has a good fitting prosthesis on the left.  Right transtibial amputation is consolidated well there is no open wounds no drainage no cellulitis no signs of infection.  Patient is ready to be fit for his prosthesis at this time.  Imaging: No results found. No images are attached to the encounter.  Labs: Lab Results  Component Value Date   HGBA1C 6.8 09/28/2018   HGBA1C 9.0 (H) 06/18/2018   HGBA1C 13.4 (H) 11/19/2017   ESRSEDRATE 126 (H) 08/29/2018   ESRSEDRATE 132 (H) 08/15/2016   ESRSEDRATE 84 (H) 06/10/2014   CRP 29.8 (H) 08/29/2018   CRP 28.3 (H) 08/15/2016   CRP 17.8 (H) 04/18/2014   REPTSTATUS 09/05/2018 FINAL 08/31/2018   GRAMSTAIN  08/30/2018    FEW WBC PRESENT, PREDOMINANTLY PMN NO ORGANISMS SEEN Performed at [facility] [address] [place], [place] [address]    CULT  08/31/2018    NO GROWTH 5 DAYS Performed at [facility] [address], [place], [place] [address]    LABORGA METHICILLIN RESISTANT STAPHYLOCOCCUS AUREUS 08/30/2018     Lab Results  Component Value Date   ALBUMIN 1.8 (L) 09/05/2018   ALBUMIN 2.3 (L) 08/29/2018   ALBUMIN 2.5 (L) 08/27/2018   PREALBUMIN 9.4 (L) 08/29/2018    Body mass index is 20.7 kg/m.  Orders:  No orders of the defined types were placed in this encounter.  No orders of the defined types were placed in this encounter.    Procedures: No procedures performed  Clinical Data: No additional findings.  ROS:  All other systems negative, except as noted in the HPI. Review of Systems  Objective: Vital Signs: Ht 5' (1.524 m)   Wt 106 lb (48.1 kg)   BMI 20.70 kg/m   Specialty Comments:  No specialty comments available.  PMFS History: Patient Active Problem List   Diagnosis Date Noted  . Abscess of foot   . Abscess of ankle 08/29/2018  . Charcot foot due to diabetes mellitus ([place]) 07/23/2018  . MRSA bacteremia   . Diabetic foot ulcer ([place]) 11/19/2017  . Femur fracture, left ([place]) 08/16/2016  . HIV (human immunodeficiency virus infection) ([place]) 08/16/2016  . GERD (gastroesophageal reflux disease) 05/24/2016  . Burn 04/10/2016  . Pressure ulcer of BKA stump,  stage 2 ([place]) 11/22/2014  . Chronic hepatitis C without hepatic coma ([place]) 11/22/2014  . S/P bilateral BKA (below knee amputation) ([place]) 06/17/2014  . HIV disease ([place])   . Poor dentition 10/25/2013  . ESRD on dialysis ([place]) 10/25/2013  . Vision changes 06/21/2013  . Orthostatic headache 04/15/2013  . Anemia of chronic renal failure 06/19/2012  . Metabolic bone disease 24/58/0998  . Chronic diarrhea 06/17/2012  . Severe protein-calorie malnutrition ([place]) 06/17/2012  . Normocytic anemia 06/17/2012  . Polyneuropathy in diabetes(357.2) 06/27/2010  . Diabetes mellitus with ESRD (end-stage renal disease) ([place]) 01/03/2010   Past Medical History:  Diagnosis Date  . AIDS ([place]) 11/22/2014   . Chronic diarrhea   . Chronic hepatitis C without hepatic coma ([place]) 11/22/2014  . 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 Diabetic neuropathy ([place])   . ESRD (end stage renal disease) on dialysis [facility])    "TTS; don't remember street name" (05/03/2014)  . Hepatitis C   . HIV INFECTION 06/27/2010   Qualifier: Diagnosis of  By: Nickola Major CMA ( [place]), Geni Bers    . Hypotension 06/02/2012  . Metabolic bone disease 07/13/8248  . MRSA infection   . Normocytic anemia 06/17/2012  . Pancreatitis   . Pressure ulcer of BKA stump, stage 2 ([place]) 11/22/2014  . Renal disorder   . Severe protein-calorie malnutrition ([place]) 06/17/2012  . Uncontrolled diabetes mellitus with complications ([place]) 5/39/7673   Annotation: uncontrolled Qualifier: Diagnosis of  By: Nickola Major CMA ( AAMA), Geni Bers      Family History  Problem Relation Age of Onset  . Diabetes Mother   . Diabetes Father     Past Surgical History:  Procedure Laterality Date  . AMPUTATION Left 04/20/2014   Procedure: 3rd toe amputation, 4th Toe Amputation,  5th Toe Amputation;  Surgeon: Newt Minion, MD;  Location: [place];  Service: Orthopedics;  Laterality: Left;  . AMPUTATION Left 05/02/2014   Procedure: Midfoot Amputation;  Surgeon: Newt Minion, MD;  Location: [place];  Service: Orthopedics;  Laterality: Left;  . AMPUTATION Left 06/17/2014   Procedure: AMPUTATION BELOW KNEE;  Surgeon: Newt Minion, MD;  Location: [place];  Service: Orthopedics;  Laterality: Left;  . AMPUTATION Right 09/04/2018   Procedure: RIGHT BELOW KNEE AMPUTATION;  Surgeon: Newt Minion, MD;  Location: [place];  Service: Orthopedics;  Laterality: Right;  RIGHT BELOW KNEE AMPUTATION  . AV FISTULA PLACEMENT Left   . AV FISTULA PLACEMENT Left 05/10/2016   Procedure: Creation Left Arm Brachiocephalic Arteriovenous Fistula and Ligation of Radiocephalic Fistula;  Surgeon: Angelia Mould, MD;  Location: [place];  Service: Vascular;  Laterality: Left;  . FEMUR IM NAIL  Left 08/17/2016   Procedure: INTRAMEDULLARY (IM) RETROGRADE FEMORAL NAILING;  Surgeon: Marybelle Killings, MD;  Location: [place];  Service: Orthopedics;  Laterality: Left;  . FOOT AMPUTATION THROUGH ANKLE Left 12/'21/2015   midfoot  . IR GENERIC HISTORICAL Left 05/01/2016   IR THROMBECTOMY AV FISTULA W/THROMBOLYSIS/PTA INC/SHUNT/IMG LEFT 05/01/2016 [name], MD [facility]  . IR GENERIC HISTORICAL  05/01/2016   IR US GUIDE VASC ACCESS LEFT 05/01/2016 [name], MD [facility]  . IR GENERIC HISTORICAL  05/07/2016   IR FLUORO GUIDE CV LINE RIGHT 05/07/2016 Corrie Mckusick, DO [facility]  . IR GENERIC HISTORICAL  05/07/2016   IR US GUIDE VASC ACCESS RIGHT 05/07/2016 Corrie Mckusick, DO [facility]  . IR GENERIC HISTORICAL  05/22/2016   IR US GUIDE VASC ACCESS RIGHT 05/22/2016  Greggory Keen, MD [facility]  . IR GENERIC HISTORICAL  05/22/2016   IR FLUORO GUIDE CV LINE RIGHT 05/22/2016 Greggory Keen, MD [facility]  . IR REMOVAL TUN CV CATH W/O FL  08/21/2016  . PERIPHERAL VASCULAR CATHETERIZATION Left 05/09/2016   Procedure: A/V Fistulagram;  Surgeon: Angelia Mould, MD;  Location: [place] CV LAB;  Service: Cardiovascular;  Laterality: Left;  arm  . TEE WITHOUT CARDIOVERSION N/A 06/22/2018   Procedure: TRANSESOPHAGEAL ECHOCARDIOGRAM (TEE);  Surgeon: Jerline Pain, MD;  Location: [facility] ENDOSCOPY;  Service: Cardiovascular;  Laterality: N/A;   Social History   Occupational History  . Not on file  Tobacco Use  . Smoking status: Never Smoker  . Smokeless tobacco: Never Used  Substance and Sexual Activity  . Alcohol use: No  . Drug use: No  . Sexual activity: Never

## 2018-10-22 ENCOUNTER — Encounter: Payer: Self-pay | Admitting: Physician Assistant

## 2018-10-22 ENCOUNTER — Ambulatory Visit (INDEPENDENT_AMBULATORY_CARE_PROVIDER_SITE_OTHER): Payer: Self-pay | Admitting: Orthopedic Surgery

## 2018-10-22 ENCOUNTER — Other Ambulatory Visit: Payer: Self-pay

## 2018-10-22 VITALS — Ht 60.0 in | Wt 106.0 lb

## 2018-10-22 DIAGNOSIS — Z89512 Acquired absence of left leg below knee: Secondary | ICD-10-CM

## 2018-10-22 DIAGNOSIS — Z89511 Acquired absence of right leg below knee: Secondary | ICD-10-CM

## 2018-10-22 NOTE — Progress Notes (Signed)
Office Visit Note   Patient: Alvin Daniels           Date of Birth: January 10, 1971           MRN: [id number] Visit Date: 10/22/2018              Requested by: [name], MD [place],  [place] [address] PCP: [name], MD  Chief Complaint  Patient presents with  . Right Leg - Routine Post Op    09/04/18 right BKA      HPI: Patient is a 48 year old gentleman who is seen in follow-up approximately 6 weeks status post right transtibial amputation.  Patient states that he fell out of the bus and landed on the residual limb on the right.  Patient complains of pain and swelling.  Assessment & Plan: Visit Diagnoses:  1. Acquired absence of right leg below knee ([place])   2. Acquired absence of left leg below knee [facility])     Plan: Patient will follow-up in 4 weeks at which time anticipate he can be fit for a prosthesis recommend that he wear his stump shrinker around-the-clock to decrease swelling.  Patient will follow-up with [facility] for prosthetic fitting.  Follow-Up Instructions: No follow-ups on file.   Ortho Exam  Patient is alert, oriented, no adenopathy, well-dressed, normal affect, normal respiratory effort. Examination there is no open wounds no cellulitis no bruising the residual limb does have some swelling patient is not wearing his compressive shrinker.  Imaging: No results found. No images are attached to the encounter.  Labs: Lab Results  Component Value Date   HGBA1C 6.8 09/28/2018   HGBA1C 9.0 (H) 06/18/2018   HGBA1C 13.4 (H) 11/19/2017   ESRSEDRATE 126 (H) 08/29/2018   ESRSEDRATE 132 (H) 08/15/2016   ESRSEDRATE 84 (H) 06/10/2014   CRP 29.8 (H) 08/29/2018   CRP 28.3 (H) 08/15/2016   CRP 17.8 (H) 04/18/2014   REPTSTATUS 09/05/2018 FINAL 08/31/2018   GRAMSTAIN  08/30/2018    FEW WBC PRESENT, PREDOMINANTLY PMN NO ORGANISMS SEEN Performed at [facility] [address], [place], [place] [address]    CULT  08/31/2018     NO GROWTH 5 DAYS Performed at [facility] [address], [place], [place] [address]    LABORGA METHICILLIN RESISTANT STAPHYLOCOCCUS AUREUS 08/30/2018     Lab Results  Component Value Date   ALBUMIN 1.8 (L) 09/05/2018   ALBUMIN 2.3 (L) 08/29/2018   ALBUMIN 2.5 (L) 08/27/2018   PREALBUMIN 9.4 (L) 08/29/2018    Body mass index is 20.7 kg/m.  Orders:  No orders of the defined types were placed in this encounter.  No orders of the defined types were placed in this encounter.    Procedures: No procedures performed  Clinical Data: No additional findings.  ROS:  All other systems negative, except as noted in the HPI. Review of Systems  Objective: Vital Signs: Ht 5' (1.524 m)   Wt 106 lb (48.1 kg)   BMI 20.70 kg/m   Specialty Comments:  No specialty comments available.  PMFS History: Patient Active Problem List   Diagnosis Date Noted  . Abscess of foot   . Abscess of ankle 08/29/2018  . Charcot foot due to diabetes mellitus ([place]) 07/23/2018  . MRSA bacteremia   . Diabetic foot ulcer ([place]) 11/19/2017  . Femur fracture, left ([place]) 08/16/2016  . HIV (human immunodeficiency virus infection) ([place]) 08/16/2016  . GERD (gastroesophageal reflux disease) 05/24/2016  . Burn 04/10/2016  .  Pressure ulcer of BKA stump, stage 2 ([place]) 11/22/2014  . Chronic hepatitis C without hepatic coma ([place]) 11/22/2014  . S/P bilateral BKA (below knee amputation) ([place]) 06/17/2014  . HIV disease ([place])   . Poor dentition 10/25/2013  . ESRD on dialysis ([place]) 10/25/2013  . Vision changes 06/21/2013  . Orthostatic headache 04/15/2013  . Anemia of chronic renal failure 06/19/2012  . Metabolic bone disease 69/67/8938  . Chronic diarrhea 06/17/2012  . Severe protein-calorie malnutrition ([place]) 06/17/2012  . Normocytic anemia 06/17/2012  . Polyneuropathy in diabetes(357.2) 06/27/2010  . Diabetes mellitus with ESRD (end-stage renal disease) ([place]) 01/03/2010   Past Medical History:   Diagnosis Date  . AIDS ([place]) 11/22/2014  . Chronic diarrhea   . Chronic hepatitis C without hepatic coma ([place]) 11/22/2014  . 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 Diabetic neuropathy ([place])   . ESRD (end stage renal disease) on dialysis [facility])    "TTS; don't remember street name" (05/03/2014)  . Hepatitis C   . HIV INFECTION 06/27/2010   Qualifier: Diagnosis of  By: Nickola Major CMA ( [place]), Geni Bers    . Hypotension 06/02/2012  . Metabolic bone disease 1/0/1751  . MRSA infection   . Normocytic anemia 06/17/2012  . Pancreatitis   . Pressure ulcer of BKA stump, stage 2 ([place]) 11/22/2014  . Renal disorder   . Severe protein-calorie malnutrition ([place]) 06/17/2012  . Uncontrolled diabetes mellitus with complications ([place]) 0/25/8527   Annotation: uncontrolled Qualifier: Diagnosis of  By: Nickola Major CMA ( AAMA), Geni Bers      Family History  Problem Relation Age of Onset  . Diabetes Mother   . Diabetes Father     Past Surgical History:  Procedure Laterality Date  . AMPUTATION Left 04/20/2014   Procedure: 3rd toe amputation, 4th Toe Amputation,  5th Toe Amputation;  Surgeon: Newt Minion, MD;  Location: [facility];  Service: Orthopedics;  Laterality: Left;  . AMPUTATION Left 05/02/2014   Procedure: Midfoot Amputation;  Surgeon: Newt Minion, MD;  Location: [place];  Service: Orthopedics;  Laterality: Left;  . AMPUTATION Left 06/17/2014   Procedure: AMPUTATION BELOW KNEE;  Surgeon: Newt Minion, MD;  Location: [facility];  Service: Orthopedics;  Laterality: Left;  . AMPUTATION Right 09/04/2018   Procedure: RIGHT BELOW KNEE AMPUTATION;  Surgeon: Newt Minion, MD;  Location: [facility];  Service: Orthopedics;  Laterality: Right;  RIGHT BELOW KNEE AMPUTATION  . AV FISTULA PLACEMENT Left   . AV FISTULA PLACEMENT Left 05/10/2016   Procedure: Creation Left Arm Brachiocephalic Arteriovenous Fistula and Ligation of Radiocephalic Fistula;  Surgeon: Angelia Mould, MD;  Location: [facility];  Service:  Vascular;  Laterality: Left;  . FEMUR IM NAIL Left 08/17/2016   Procedure: INTRAMEDULLARY (IM) RETROGRADE FEMORAL NAILING;  Surgeon: Marybelle Killings, MD;  Location: [facility];  Service: Orthopedics;  Laterality: Left;  . FOOT AMPUTATION THROUGH ANKLE Left 12/'21/2015   midfoot  . IR GENERIC HISTORICAL Left 05/01/2016   IR THROMBECTOMY AV FISTULA W/THROMBOLYSIS/PTA INC/SHUNT/IMG LEFT 05/01/2016 [name], MD [facility]  . IR GENERIC HISTORICAL  05/01/2016   IR US GUIDE VASC ACCESS LEFT 05/01/2016 [name], MD [facility]  . IR GENERIC HISTORICAL  05/07/2016   IR FLUORO GUIDE CV LINE RIGHT 05/07/2016 Corrie Mckusick, DO [facility]  . IR GENERIC HISTORICAL  05/07/2016   IR US GUIDE VASC ACCESS RIGHT 05/07/2016 Corrie Mckusick, DO [facility]  . IR GENERIC HISTORICAL  05/22/2016   IR US  GUIDE VASC ACCESS RIGHT 05/22/2016 Greggory Keen, MD [facility]  . IR GENERIC HISTORICAL  05/22/2016   IR FLUORO GUIDE CV LINE RIGHT 05/22/2016 Greggory Keen, MD [facility]  . IR REMOVAL TUN CV CATH W/O FL  08/21/2016  . PERIPHERAL VASCULAR CATHETERIZATION Left 05/09/2016   Procedure: A/V Fistulagram;  Surgeon: Angelia Mould, MD;  Location: [facility];  Service: Cardiovascular;  Laterality: Left;  arm  . TEE WITHOUT CARDIOVERSION N/A 06/22/2018   Procedure: TRANSESOPHAGEAL ECHOCARDIOGRAM (TEE);  Surgeon: Jerline Pain, MD;  Location: [facility] ENDOSCOPY;  Service: Cardiovascular;  Laterality: N/A;   Social History   Occupational History  . Not on file  Tobacco Use  . Smoking status: Never Smoker  . Smokeless tobacco: Never Used  Substance and Sexual Activity  . Alcohol use: No  . Drug use: No  . Sexual activity: Never

## 2018-11-11 ENCOUNTER — Telehealth: Payer: Self-pay | Admitting: *Deleted

## 2018-11-11 NOTE — Telephone Encounter (Signed)
Patient requesting a letter in Spanish/English stating his Hepatitis C has been cured.  He is requesting this for his new aide helping him at the home. Patient is asking to pick this up when it is ready, hopes to be able to get it today if possible. Landis Gandy, RN

## 2018-11-12 ENCOUNTER — Encounter: Payer: Self-pay | Admitting: Infectious Disease

## 2018-11-12 ENCOUNTER — Telehealth: Payer: Self-pay | Admitting: Infectious Disease

## 2018-11-12 NOTE — Telephone Encounter (Signed)
complete

## 2018-11-12 NOTE — Telephone Encounter (Signed)
letter

## 2018-11-12 NOTE — Telephone Encounter (Signed)
RN notified Audelia Hives, he will notify patient for pick up.

## 2018-11-17 ENCOUNTER — Ambulatory Visit: Payer: Self-pay

## 2018-11-17 ENCOUNTER — Other Ambulatory Visit: Payer: Self-pay

## 2018-11-17 DIAGNOSIS — B20 Human immunodeficiency virus [HIV] disease: Secondary | ICD-10-CM

## 2018-11-18 LAB — T-HELPER CELL (CD4) - (RCID CLINIC ONLY)
CD4 % Helper T Cell: 21 % — ABNORMAL LOW (ref 33–65)
CD4 T Cell Abs: 286 /uL — ABNORMAL LOW (ref 400–1790)

## 2018-11-19 ENCOUNTER — Ambulatory Visit: Payer: Self-pay | Admitting: Orthopedic Surgery

## 2018-11-24 ENCOUNTER — Emergency Department (HOSPITAL_COMMUNITY)
Admission: EM | Admit: 2018-11-24 | Discharge: 2018-11-24 | Disposition: A | Discharge status: Home / Self Care | Payer: Self-pay | Attending: Emergency Medicine | Admitting: Emergency Medicine

## 2018-11-24 ENCOUNTER — Other Ambulatory Visit: Payer: Self-pay

## 2018-11-24 DIAGNOSIS — B20 Human immunodeficiency virus [HIV] disease: Secondary | ICD-10-CM | POA: Insufficient documentation

## 2018-11-24 DIAGNOSIS — E1140 Type 2 diabetes mellitus with diabetic neuropathy, unspecified: Secondary | ICD-10-CM | POA: Insufficient documentation

## 2018-11-24 DIAGNOSIS — Z794 Long term (current) use of insulin: Secondary | ICD-10-CM | POA: Insufficient documentation

## 2018-11-24 DIAGNOSIS — N186 End stage renal disease: Secondary | ICD-10-CM | POA: Insufficient documentation

## 2018-11-24 DIAGNOSIS — Z992 Dependence on renal dialysis: Secondary | ICD-10-CM | POA: Insufficient documentation

## 2018-11-24 DIAGNOSIS — I953 Hypotension of hemodialysis: Secondary | ICD-10-CM | POA: Insufficient documentation

## 2018-11-24 DIAGNOSIS — M542 Cervicalgia: Secondary | ICD-10-CM | POA: Insufficient documentation

## 2018-11-24 DIAGNOSIS — M79601 Pain in right arm: Secondary | ICD-10-CM | POA: Insufficient documentation

## 2018-11-24 DIAGNOSIS — E1122 Type 2 diabetes mellitus with diabetic chronic kidney disease: Secondary | ICD-10-CM | POA: Insufficient documentation

## 2018-11-24 DIAGNOSIS — M79602 Pain in left arm: Secondary | ICD-10-CM | POA: Insufficient documentation

## 2018-11-24 LAB — COMPREHENSIVE METABOLIC PANEL
ALT: 24 U/L (ref 0–44)
AST: 35 U/L (ref 15–41)
Albumin: 3.3 g/dL — ABNORMAL LOW (ref 3.5–5.0)
Alkaline Phosphatase: 117 U/L (ref 38–126)
Anion gap: 13 (ref 5–15)
BUN: 25 mg/dL — ABNORMAL HIGH (ref 6–20)
CO2: 28 mmol/L (ref 22–32)
Calcium: 8.3 mg/dL — ABNORMAL LOW (ref 8.9–10.3)
Chloride: 98 mmol/L (ref 98–111)
Creatinine, Ser: 7.97 mg/dL — ABNORMAL HIGH (ref 0.61–1.24)
GFR calc Af Amer: 8 mL/min — ABNORMAL LOW (ref >60)
GFR calc non Af Amer: 7 mL/min — ABNORMAL LOW (ref >60)
Glucose, Bld: 121 mg/dL — ABNORMAL HIGH (ref 70–99)
Potassium: 3.7 mmol/L (ref 3.5–5.1)
Sodium: 139 mmol/L (ref 135–145)
Total Bilirubin: 0.5 mg/dL (ref 0.3–1.2)
Total Protein: 9 g/dL — ABNORMAL HIGH (ref 6.5–8.1)

## 2018-11-24 LAB — CBC WITH DIFFERENTIAL/PLATELET
Abs Immature Granulocytes: 0.04 10*3/uL (ref 0.00–0.07)
Basophils Absolute: 0.1 10*3/uL (ref 0.0–0.1)
Basophils Relative: 1 %
Eosinophils Absolute: 0.5 10*3/uL (ref 0.0–0.5)
Eosinophils Relative: 5 %
HCT: 32.8 % — ABNORMAL LOW (ref 39.0–52.0)
Hemoglobin: 10.8 g/dL — ABNORMAL LOW (ref 13.0–17.0)
Immature Granulocytes: 1 %
Lymphocytes Relative: 19 %
Lymphs Abs: 1.7 10*3/uL (ref 0.7–4.0)
MCH: 31.5 pg (ref 26.0–34.0)
MCHC: 32.9 g/dL (ref 30.0–36.0)
MCV: 95.6 fL (ref 80.0–100.0)
Monocytes Absolute: 0.6 10*3/uL (ref 0.1–1.0)
Monocytes Relative: 7 %
Neutro Abs: 5.9 10*3/uL (ref 1.7–7.7)
Neutrophils Relative %: 67 %
Platelets: 176 10*3/uL (ref 150–400)
RBC: 3.43 MIL/uL — ABNORMAL LOW (ref 4.22–5.81)
RDW: 14.4 % (ref 11.5–15.5)
WBC: 8.7 10*3/uL (ref 4.0–10.5)
nRBC: 0 % (ref 0.0–0.2)

## 2018-11-24 MED ORDER — SODIUM CHLORIDE 0.9 % IV BOLUS
500.0000 mL | Freq: Once | INTRAVENOUS | Status: AC
  Administered 2018-11-24: 500 mL via INTRAVENOUS

## 2018-11-24 MED ORDER — ACETAMINOPHEN 500 MG PO TABS
1000.0000 mg | ORAL_TABLET | Freq: Once | ORAL | Status: AC
  Administered 2018-11-24: 17:00:00 1000 mg via ORAL
  Filled 2018-11-24: qty 2

## 2018-11-24 MED ORDER — ACETAMINOPHEN 325 MG PO TABS: 650.0000 mg | ORAL_TABLET | Freq: Four times a day (QID) | ORAL | Status: DC | PRN

## 2018-11-24 NOTE — ED Provider Notes (Signed)
[facility] EMERGENCY DEPARTMENT Provider Note   CSN: [id number] Arrival date & time: 11/24/18  1626    History   Chief Complaint Chief Complaint  Patient presents with  . Hypotension    HPI Alvin Daniels is a 48 y.o. male.     HPI Patient presents after developing lightheadedness, after dialysis. Patient acknowledges multiple medical issues, but states that he was in his usual state of health until dialysis today. He had a normal length of session, completing it. Subsequently he went to get lunch, was lightheaded, developed some neck pain, bilateral arm pain Patient found to be hypotensive in the field, though he denies other conditions including lightheadedness, syncope, chest pain, dyspnea. Here, patient is awake alert, providing the history. He denies other pain, states that he is taken no medication for the neck and bilateral arm pain yet. He denies cough, fever, dyspnea. Past Medical History:  Diagnosis Date  . AIDS ([place]) 11/22/2014  . Chronic diarrhea   . Chronic hepatitis C without hepatic coma ([place]) 11/22/2014  . 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 Diabetic neuropathy ([place])   . ESRD (end stage renal disease) on dialysis [facility])    "TTS; don't remember street name" (05/03/2014)  . Hepatitis C   . HIV INFECTION 06/27/2010   Qualifier: Diagnosis of  By: Nickola Major CMA ( [place]), Geni Bers    . Hypotension 06/02/2012  . Metabolic bone disease 07/13/9922  . MRSA infection   . Normocytic anemia 06/17/2012  . Pancreatitis   . Pressure ulcer of BKA stump, stage 2 ([place]) 11/22/2014  . Renal disorder   . Severe protein-calorie malnutrition ([place]) 06/17/2012  . Uncontrolled diabetes mellitus with complications ([place]) 2/68/3419   Annotation: uncontrolled Qualifier: Diagnosis of  By: Nickola Major CMA Deborra Medina), Jacqueline      Patient Active Problem List   Diagnosis Date Noted  . Abscess of foot   . Abscess of ankle 08/29/2018  . Charcot foot due  to diabetes mellitus ([place]) 07/23/2018  . MRSA bacteremia   . Diabetic foot ulcer ([place]) 11/19/2017  . Femur fracture, left ([place]) 08/16/2016  . HIV (human immunodeficiency virus infection) ([place]) 08/16/2016  . GERD (gastroesophageal reflux disease) 05/24/2016  . Burn 04/10/2016  . Pressure ulcer of BKA stump, stage 2 ([place]) 11/22/2014  . Chronic hepatitis C without hepatic coma ([place]) 11/22/2014  . S/P bilateral BKA (below knee amputation) ([place]) 06/17/2014  . HIV disease ([place])   . Poor dentition 10/25/2013  . ESRD on dialysis ([place]) 10/25/2013  . Vision changes 06/21/2013  . Orthostatic headache 04/15/2013  . Anemia of chronic renal failure 06/19/2012  . Metabolic bone disease 62/22/9798  . Chronic diarrhea 06/17/2012  . Severe protein-calorie malnutrition ([place]) 06/17/2012  . Normocytic anemia 06/17/2012  . Polyneuropathy in diabetes(357.2) 06/27/2010  . Diabetes mellitus with ESRD (end-stage renal disease) ([place]) 01/03/2010    Past Surgical History:  Procedure Laterality Date  . AMPUTATION Left 04/20/2014   Procedure: 3rd toe amputation, 4th Toe Amputation,  5th Toe Amputation;  Surgeon: Newt Minion, MD;  Location: [facility];  Service: Orthopedics;  Laterality: Left;  . AMPUTATION Left 05/02/2014   Procedure: Midfoot Amputation;  Surgeon: Newt Minion, MD;  Location: [facility];  Service: Orthopedics;  Laterality: Left;  . AMPUTATION Left 06/17/2014   Procedure: AMPUTATION BELOW KNEE;  Surgeon: Newt Minion, MD;  Location: [facility];  Service: Orthopedics;  Laterality: Left;  . AMPUTATION Right 09/04/2018   Procedure: RIGHT BELOW KNEE  AMPUTATION;  Surgeon: Newt Minion, MD;  Location: [facility];  Service: Orthopedics;  Laterality: Right;  RIGHT BELOW KNEE AMPUTATION  . AV FISTULA PLACEMENT Left   . AV FISTULA PLACEMENT Left 05/10/2016   Procedure: Creation Left Arm Brachiocephalic Arteriovenous Fistula and Ligation of Radiocephalic Fistula;  Surgeon: Angelia Mould, MD;  Location: [facility];   Service: Vascular;  Laterality: Left;  . FEMUR IM NAIL Left 08/17/2016   Procedure: INTRAMEDULLARY (IM) RETROGRADE FEMORAL NAILING;  Surgeon: Marybelle Killings, MD;  Location: [facility];  Service: Orthopedics;  Laterality: Left;  . FOOT AMPUTATION THROUGH ANKLE Left 12/'21/2015   midfoot  . IR GENERIC HISTORICAL Left 05/01/2016   IR THROMBECTOMY AV FISTULA W/THROMBOLYSIS/PTA INC/SHUNT/IMG LEFT 05/01/2016 [name], MD [facility]  . IR GENERIC HISTORICAL  05/01/2016   IR US GUIDE VASC ACCESS LEFT 05/01/2016 [name], MD [facility]  . IR GENERIC HISTORICAL  05/07/2016   IR FLUORO GUIDE CV LINE RIGHT 05/07/2016 Corrie Mckusick, DO [facility]  . IR GENERIC HISTORICAL  05/07/2016   IR US GUIDE VASC ACCESS RIGHT 05/07/2016 Corrie Mckusick, DO [facility]  . IR GENERIC HISTORICAL  05/22/2016   IR US GUIDE VASC ACCESS RIGHT 05/22/2016 Greggory Keen, MD [facility]  . IR GENERIC HISTORICAL  05/22/2016   IR FLUORO GUIDE CV LINE RIGHT 05/22/2016 Greggory Keen, MD [facility]  . IR REMOVAL TUN CV CATH W/O FL  08/21/2016  . PERIPHERAL VASCULAR CATHETERIZATION Left 05/09/2016   Procedure: A/V Fistulagram;  Surgeon: Angelia Mould, MD;  Location: [facility];  Service: Cardiovascular;  Laterality: Left;  arm  . TEE WITHOUT CARDIOVERSION N/A 06/22/2018   Procedure: TRANSESOPHAGEAL ECHOCARDIOGRAM (TEE);  Surgeon: Jerline Pain, MD;  Location: [facility] ENDOSCOPY;  Service: Cardiovascular;  Laterality: N/A;        Home Medications    Prior to Admission medications   Medication Sig Start Date End Date Taking? Authorizing Provider  acetaminophen (TYLENOL) 325 MG tablet Take 2 tablets (650 mg total) by mouth every 6 (six) hours as needed for mild pain. 06/23/18   Mariel Aloe, MD  Amino Acids-Protein Hydrolys (FEEDING SUPPLEMENT, PRO-STAT SUGAR FREE 64,) LIQD Take 30 mLs by mouth 2 (two) times daily. 09/07/18   Lavina Hamman, MD  bictegravir-emtricitabine-tenofovir AF (BIKTARVY) 50-200-25  MG TABS tablet Take 1 tablet by mouth daily. 10/13/18   Thayer Headings, MD  Blood Glucose Monitoring Suppl (TRUE METRIX METER) DEVI 1 each by Does not apply route 3 (three) times daily. 09/15/18   [name], MD  docusate sodium (COLACE) 100 MG capsule Take 1 capsule (100 mg total) by mouth 2 (two) times daily as needed for mild constipation. 09/07/18   Lavina Hamman, MD  glucose blood (TRUE METRIX BLOOD GLUCOSE TEST) test strip 1 each by Other route 3 (three) times daily. 09/15/18   [name], MD  insulin NPH-regular Human (70-30) 100 UNIT/ML injection Inject 15 Units into the skin 2 (two) times daily with a meal. 09/28/18   [name], MD  Insulin Syringe-Needle U-100 (BD INSULIN SYRINGE ULTRAFINE) 31G X 15/64" 0.5 ML MISC 1 each by Does not apply route 2 (two) times daily. Please dispense needles as well. 09/28/18   [name], MD  midodrine (PROAMATINE) 10 MG tablet Take 1 tablet (10 mg total) by mouth Every Tuesday,Thursday,and Saturday with dialysis. 09/08/18   Lavina Hamman, MD  Nutritional Supplements (FEEDING SUPPLEMENT, NEPRO CARB STEADY,) LIQD Take 237 mLs by mouth 2 (  two) times daily between meals. 09/07/18   Lavina Hamman, MD  TRUEplus Lancets 28G MISC 1 each by Does not apply route 3 (three) times daily. 09/15/18   [name], MD    Family History Family History  Problem Relation Age of Onset  . Diabetes Mother   . Diabetes Father     Social History Social History   Tobacco Use  . Smoking status: Never Smoker  . Smokeless tobacco: Never Used  Substance Use Topics  . Alcohol use: No  . Drug use: No     Allergies   Patient has no known allergies.   Review of Systems Review of Systems  Constitutional:       Per HPI, otherwise negative  HENT:       Per HPI, otherwise negative  Respiratory:       Per HPI, otherwise negative  Cardiovascular:       Per HPI, otherwise negative  Gastrointestinal: Negative for vomiting.  Endocrine:       Negative  aside from HPI  Genitourinary:       Neg aside from HPI   Musculoskeletal:       Prior bilateral lower extremity amputations  Skin: Negative.   Allergic/Immunologic: Positive for immunocompromised state.  Neurological: Negative for syncope.     Physical Exam Updated Vital Signs BP 116/67   Pulse (!) 102   Temp 98.2 F (36.8 C) (Oral)   Resp 14   SpO2 100%   Physical Exam Vitals signs and nursing note reviewed.  Constitutional:      General: He is not in acute distress.    Appearance: He is well-developed. He is not toxic-appearing.     Comments: Thin adult male awake and alert speaking easily, in no apparent discomfort.  HENT:     Head: Normocephalic and atraumatic.  Eyes:     Conjunctiva/sclera: Conjunctivae normal.  Cardiovascular:     Rate and Rhythm: Regular rhythm. Tachycardia present.  Pulmonary:     Effort: Pulmonary effort is normal. No respiratory distress.     Breath sounds: No stridor.  Abdominal:     General: There is no distension.  Musculoskeletal:     Comments: Bilateral lower extremity amputations.  Skin:    General: Skin is warm and dry.  Neurological:     Mental Status: He is alert and oriented to person, place, and time.      ED Treatments / Results  Labs (all labs ordered are listed, but only abnormal results are displayed) Labs Reviewed  COMPREHENSIVE METABOLIC PANEL - Abnormal; Notable for the following components:      Result Value   Glucose, Bld 121 (*)    BUN 25 (*)    Creatinine, Ser 7.97 (*)    Calcium 8.3 (*)    Total Protein 9.0 (*)    Albumin 3.3 (*)    GFR calc non Af Amer 7 (*)    GFR calc Af Amer 8 (*)    All other components within normal limits  CBC WITH DIFFERENTIAL/PLATELET - Abnormal; Notable for the following components:   RBC 3.43 (*)    Hemoglobin 10.8 (*)    HCT 32.8 (*)    All other components within normal limits    EKG EKG Interpretation  Date/Time:  Tuesday November 24 2018 16:34:19 EDT Ventricular  Rate:  100 PR Interval:    QRS Duration: 80 QT Interval:  365 QTC Calculation: 471 R Axis:   42 Text Interpretation:  Sinus tachycardia  Artifact Early repolarization pattern Abnormal ECG Confirmed by Carmin Muskrat [phone]) on 11/24/2018 4:46:58 PM   Radiology No results found.  Procedures Procedures (including critical care time)  Medications Ordered in ED Medications  sodium chloride 0.9 % bolus 500 mL (0 mLs Intravenous Stopped 11/24/18 1810)  acetaminophen (TYLENOL) tablet 1,000 mg (1,000 mg Oral Given 11/24/18 1703)     Initial Impression / Assessment and Plan / ED Course  I have reviewed the triage vital signs and the nursing notes.  Pertinent labs & imaging results that were available during my care of the patient were reviewed by me and considered in my medical decision making (see chart for details).    Patient found to be hypotensive on arrival, 75/50. Patient received fluid bolus, and blood pressure has improved, 103/66.    6:52 PM In no distress, awake, alert. Patient aware of all findings including reassuring labs. Pressure has remained unremarkable, some suspicion for postdialysis fluctuations. Without other alarming findings he is appropriate for discharge. Patient requested that I help with his hope for a consideration of new dialysis access placement. I have forwarded a message to his primary care physician as he has no nephrologist listed.   Final Clinical Impressions(s) / ED Diagnoses   Final diagnoses:  Hemodialysis-associated hypotension  Bilateral arm pain     Carmin Muskrat, MD 11/24/18 2337

## 2018-11-24 NOTE — ED Notes (Signed)
Patient verbalizes understanding of discharge instructions. Opportunity for questioning and answers were provided. Armband removed by staff, pt discharged from ED via wheelchair to home.  

## 2018-11-24 NOTE — ED Triage Notes (Addendum)
Pt went to dialysis earlier today abt 1030. Pt went to [facility] after dialysis and experienced hypotension 66/40 per ems. Pt also co neck pain that he doesn't normally get after dialysis. EMS gave 259ml of fluid.

## 2018-11-24 NOTE — Discharge Instructions (Signed)
Le envi un mensaje a su mdico para que lo ayude a Glass blower/designer un nuevo acceso de dilisis. Por favor discuta esto con ella.  La evaluacin de esta noche fue tranquilizadora. Pero, regrese aqu si se Naval architect.

## 2018-11-25 LAB — HIV-1 RNA QUANT-NO REFLEX-BLD
HIV 1 RNA Quant: <20 copies/mL
HIV-1 RNA Quant, Log: <1.3 Log copies/mL

## 2018-11-25 LAB — CBC WITH DIFFERENTIAL/PLATELET
Absolute Monocytes: 551 cells/uL (ref 200–950)
Basophils Absolute: 58 cells/uL (ref 0–200)
Basophils Relative: 1 %
Eosinophils Absolute: 325 cells/uL (ref 15–500)
Eosinophils Relative: 5.6 %
HCT: 32.9 % — ABNORMAL LOW (ref 38.5–50.0)
Hemoglobin: 11.4 g/dL — ABNORMAL LOW (ref 13.2–17.1)
Lymphs Abs: 1409 cells/uL (ref 850–3900)
MCH: 31.1 pg (ref 27.0–33.0)
MCHC: 34.7 g/dL (ref 32.0–36.0)
MCV: 89.9 fL (ref 80.0–100.0)
MPV: 10.1 fL (ref 7.5–12.5)
Monocytes Relative: 9.5 %
Neutro Abs: 3457 cells/uL (ref 1500–7800)
Neutrophils Relative %: 59.6 %
Platelets: 193 10*3/uL (ref 140–400)
RBC: 3.66 10*6/uL — ABNORMAL LOW (ref 4.20–5.80)
RDW: 16.2 % — ABNORMAL HIGH (ref 11.0–15.0)
Total Lymphocyte: 24.3 %
WBC: 5.8 10*3/uL (ref 3.8–10.8)

## 2018-11-25 LAB — RPR: RPR Ser Ql: NONREACTIVE

## 2018-11-27 ENCOUNTER — Telehealth: Payer: Self-pay | Admitting: Family Medicine

## 2018-11-27 NOTE — Telephone Encounter (Signed)
I received a message from the ED Physician stating this patient is requesting a new Hemodialysis vascular access site. Could you please reach out to his Dialysis Center so they as well as his Nephrologist is aware and can address this? Thanks.

## 2018-12-17 ENCOUNTER — Encounter: Payer: Self-pay | Admitting: Infectious Disease

## 2018-12-24 ENCOUNTER — Ambulatory Visit: Payer: Self-pay | Admitting: Orthopedic Surgery

## 2018-12-28 ENCOUNTER — Ambulatory Visit: Payer: Self-pay | Admitting: Infectious Disease

## 2018-12-28 ENCOUNTER — Ambulatory Visit: Payer: Self-pay | Admitting: Orthopedic Surgery

## 2019-01-25 ENCOUNTER — Other Ambulatory Visit: Payer: Self-pay | Admitting: Internal Medicine

## 2019-01-25 DIAGNOSIS — B20 Human immunodeficiency virus [HIV] disease: Secondary | ICD-10-CM

## 2019-01-27 ENCOUNTER — Encounter: Payer: Self-pay | Admitting: Infectious Disease

## 2019-01-27 ENCOUNTER — Other Ambulatory Visit: Payer: Self-pay

## 2019-01-27 ENCOUNTER — Ambulatory Visit: Payer: Self-pay

## 2019-01-27 ENCOUNTER — Ambulatory Visit (INDEPENDENT_AMBULATORY_CARE_PROVIDER_SITE_OTHER): Payer: Self-pay | Admitting: Infectious Disease

## 2019-01-27 VITALS — BP 97/64 | HR 99 | Temp 98.3°F

## 2019-01-27 DIAGNOSIS — N186 End stage renal disease: Secondary | ICD-10-CM

## 2019-01-27 DIAGNOSIS — L89892 Pressure ulcer of other site, stage 2: Secondary | ICD-10-CM

## 2019-01-27 DIAGNOSIS — B20 Human immunodeficiency virus [HIV] disease: Secondary | ICD-10-CM

## 2019-01-27 DIAGNOSIS — R7881 Bacteremia: Secondary | ICD-10-CM

## 2019-01-27 DIAGNOSIS — L97418 Non-pressure chronic ulcer of right heel and midfoot with other specified severity: Secondary | ICD-10-CM

## 2019-01-27 DIAGNOSIS — E10621 Type 1 diabetes mellitus with foot ulcer: Secondary | ICD-10-CM

## 2019-01-27 DIAGNOSIS — Z23 Encounter for immunization: Secondary | ICD-10-CM

## 2019-01-27 DIAGNOSIS — Z992 Dependence on renal dialysis: Secondary | ICD-10-CM

## 2019-01-27 DIAGNOSIS — B9562 Methicillin resistant Staphylococcus aureus infection as the cause of diseases classified elsewhere: Secondary | ICD-10-CM

## 2019-01-27 DIAGNOSIS — L02419 Cutaneous abscess of limb, unspecified: Secondary | ICD-10-CM

## 2019-01-27 DIAGNOSIS — Z89512 Acquired absence of left leg below knee: Secondary | ICD-10-CM

## 2019-01-27 DIAGNOSIS — T8789 Other complications of amputation stump: Secondary | ICD-10-CM

## 2019-01-27 DIAGNOSIS — Z89511 Acquired absence of right leg below knee: Secondary | ICD-10-CM

## 2019-01-27 NOTE — Progress Notes (Signed)
Chief complaint: followup for HIV Subjective:    Patient ID: Alvin Daniels, male    DOB: 10-Nov-1970, 48 y.o.   MRN: [id number]  HPI  48 year old with HIV, HCV coinfection, DM, with ESRD on hemodialysis, Diabetic foot infection with gangrene requiring BKA now sp fall with femur fracture.  He continues to be highly adherent to his ARV and is with undetectable viral load.  He has subsequently been changed to [employer].  He was admitted to the hospital service in February with MRSA bacteremia.  He did not have a dialysis catheter in place and his fistula did not appear overtly infected.  He underwent transesophageal echocardiogram which was negative for endocarditis.  His blood cultures cleared.  He had MRI of his foot which did not show evidence of osteomyelitis he was seen by Meridee Score with orthopedics and is been followed closely by him afterwards in clinic.  He was discharged to complete 2 weeks of vancomycin intravenously with dialysis.  There was a hold-up in him getting antibiotics with dialysis for at least 1 or 2 sessions which greatly disappoints me.  He then in the interim had recurrence of his bacteremia after a 6-week course of therapy.  MRI of the foot then in April showed osteomyelitis of the distal fibula talus and calcaneus and cuboid bones.  Patient underwent of the knee amputation on the 24th.  Had a 2D echocardiogram but no transesophageal echocardiogram which I find fairly surprising in a patient had recurrent bacteremias.  Regardless he then finished a course of IV antibiotics that was at least 2 weeks post clearance of blood cultures and post surgery.  He seems to be doing well since then.  Follow-up labs for his HIV reveals viral load to be undetectable.  He is in good spirits today and comes in showing me his amputation site which is well-healed.  He is renewed his HIV medication assistance program.  Past Medical History:  Diagnosis Date  . AIDS ([place])  11/22/2014  . Chronic diarrhea   . Chronic hepatitis C without hepatic coma ([place]) 11/22/2014  . 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 Diabetic neuropathy ([place])   . ESRD (end stage renal disease) on dialysis [facility])    "TTS; don't remember street name" (05/03/2014)  . Hepatitis C   . HIV INFECTION 06/27/2010   Qualifier: Diagnosis of  By: Nickola Major CMA ( [place]), Geni Bers    . Hypotension 06/02/2012  . Metabolic bone disease [id number]  . MRSA infection   . Normocytic anemia 06/17/2012  . Pancreatitis   . Pressure ulcer of BKA stump, stage 2 ([place]) 11/22/2014  . Renal disorder   . Severe protein-calorie malnutrition ([place]) 06/17/2012  . Uncontrolled diabetes mellitus with complications ([place]) 0000000   Annotation: uncontrolled Qualifier: Diagnosis of  By: Nickola Major CMA Deborra Medina), Geni Bers      Past Surgical History:  Procedure Laterality Date  . AMPUTATION Left 04/20/2014   Procedure: 3rd toe amputation, 4th Toe Amputation,  5th Toe Amputation;  Surgeon: Newt Minion, MD;  Location: [place];  Service: Orthopedics;  Laterality: Left;  . AMPUTATION Left 05/02/2014   Procedure: Midfoot Amputation;  Surgeon: Newt Minion, MD;  Location: [place];  Service: Orthopedics;  Laterality: Left;  . AMPUTATION Left 06/17/2014   Procedure: AMPUTATION BELOW KNEE;  Surgeon: Newt Minion, MD;  Location: [place];  Service: Orthopedics;  Laterality: Left;  . AMPUTATION Right 09/04/2018   Procedure: RIGHT BELOW KNEE AMPUTATION;  Surgeon:  Newt Minion, MD;  Location: [place];  Service: Orthopedics;  Laterality: Right;  RIGHT BELOW KNEE AMPUTATION  . AV FISTULA PLACEMENT Left   . AV FISTULA PLACEMENT Left 05/10/2016   Procedure: Creation Left Arm Brachiocephalic Arteriovenous Fistula and Ligation of Radiocephalic Fistula;  Surgeon: Angelia Mould, MD;  Location: [place];  Service: Vascular;  Laterality: Left;  . FEMUR IM NAIL Left 08/17/2016   Procedure: INTRAMEDULLARY (IM) RETROGRADE FEMORAL NAILING;  Surgeon:  Marybelle Killings, MD;  Location: [place];  Service: Orthopedics;  Laterality: Left;  . FOOT AMPUTATION THROUGH ANKLE Left 12/'21/2015   midfoot  . IR GENERIC HISTORICAL Left 05/01/2016   IR THROMBECTOMY AV FISTULA W/THROMBOLYSIS/PTA INC/SHUNT/IMG LEFT 05/01/2016 [name], MD [facility]  . IR GENERIC HISTORICAL  05/01/2016   IR US GUIDE VASC ACCESS LEFT 05/01/2016 [name], MD [facility]  . IR GENERIC HISTORICAL  05/07/2016   IR FLUORO GUIDE CV LINE RIGHT 05/07/2016 Corrie Mckusick, DO [facility]  . IR GENERIC HISTORICAL  05/07/2016   IR US GUIDE VASC ACCESS RIGHT 05/07/2016 Corrie Mckusick, DO [facility]  . IR GENERIC HISTORICAL  05/22/2016   IR US GUIDE VASC ACCESS RIGHT 05/22/2016 Greggory Keen, MD [facility]  . IR GENERIC HISTORICAL  05/22/2016   IR FLUORO GUIDE CV LINE RIGHT 05/22/2016 Greggory Keen, MD [facility]  . IR REMOVAL TUN CV CATH W/O FL  08/21/2016  . PERIPHERAL VASCULAR CATHETERIZATION Left 05/09/2016   Procedure: A/V Fistulagram;  Surgeon: Angelia Mould, MD;  Location: [place] CV LAB;  Service: Cardiovascular;  Laterality: Left;  arm  . TEE WITHOUT CARDIOVERSION N/A 06/22/2018   Procedure: TRANSESOPHAGEAL ECHOCARDIOGRAM (TEE);  Surgeon: Jerline Pain, MD;  Location: [facility] ENDOSCOPY;  Service: Cardiovascular;  Laterality: N/A;    Family History  Problem Relation Age of Onset  . Diabetes Mother   . Diabetes Father       Social History   Socioeconomic History  . Marital status: Single    Spouse name: Not on file  . Number of children: Not on file  . Years of education: Not on file  . Highest education level: Not on file  Occupational History  . Not on file  Social Needs  . Financial resource strain: Not on file  . Food insecurity    Worry: Not on file    Inability: Not on file  . Transportation needs    Medical: Not on file    Non-medical: Not on file  Tobacco Use  . Smoking status: Never Smoker  . Smokeless tobacco: Never Used   Substance and Sexual Activity  . Alcohol use: No  . Drug use: No  . Sexual activity: Never  Lifestyle  . Physical activity    Days per week: Not on file    Minutes per session: Not on file  . Stress: Not on file  Relationships  . Social Herbalist on phone: Not on file    Gets together: Not on file    Attends religious service: Not on file    Active member of club or organization: Not on file    Attends meetings of clubs or organizations: Not on file    Relationship status: Not on file  Other Topics Concern  . Not on file  Social History Narrative   ** Merged History Encounter **        No Known Allergies   Current Outpatient Medications:  .  acetaminophen (TYLENOL) 325  MG tablet, Take 2 tablets (650 mg total) by mouth every 6 (six) hours as needed for mild pain., Disp:  , Rfl:  .  Amino Acids-Protein Hydrolys (FEEDING SUPPLEMENT, PRO-STAT SUGAR FREE 64,) LIQD, Take 30 mLs by mouth 2 (two) times daily., Disp: 887 mL, Rfl: 0 .  BIKTARVY 50-200-25 MG TABS tablet, TAKE 1 TABLET BY MOUTH DAILY, Disp: 30 tablet, Rfl: 3 .  Blood Glucose Monitoring Suppl (TRUE METRIX METER) DEVI, 1 each by Does not apply route 3 (three) times daily., Disp: 1 Device, Rfl: 0 .  docusate sodium (COLACE) 100 MG capsule, Take 1 capsule (100 mg total) by mouth 2 (two) times daily as needed for mild constipation., Disp: 10 capsule, Rfl: 0 .  glucose blood (TRUE METRIX BLOOD GLUCOSE TEST) test strip, 1 each by Other route 3 (three) times daily., Disp: 100 each, Rfl: 12 .  insulin NPH-regular Human (70-30) 100 UNIT/ML injection, Inject 15 Units into the skin 2 (two) times daily with a meal., Disp: 30 mL, Rfl: 6 .  Insulin Syringe-Needle U-100 (BD INSULIN SYRINGE ULTRAFINE) 31G X 15/64" 0.5 ML MISC, 1 each by Does not apply route 2 (two) times daily. Please dispense needles as well., Disp: 60 each, Rfl: 5 .  midodrine (PROAMATINE) 10 MG tablet, Take 1 tablet (10 mg total) by mouth Every  Tuesday,Thursday,and Saturday with dialysis., Disp: 15 tablet, Rfl: 0 .  Nutritional Supplements (FEEDING SUPPLEMENT, NEPRO CARB STEADY,) LIQD, Take 237 mLs by mouth 2 (two) times daily between meals., Disp: 60 Can, Rfl: 0 .  TRUEplus Lancets 28G MISC, 1 each by Does not apply route 3 (three) times daily., Disp: 100 each, Rfl: 11    Review of Systems  Constitutional: Negative for activity change, appetite change, chills, diaphoresis, fatigue, fever and unexpected weight change.  HENT: Negative for congestion, rhinorrhea, sinus pressure, sneezing, sore throat and trouble swallowing.   Eyes: Negative for photophobia and visual disturbance.  Respiratory: Negative for cough, chest tightness, shortness of breath, wheezing and stridor.   Cardiovascular: Negative for chest pain, palpitations and leg swelling.  Gastrointestinal: Negative for abdominal distention, abdominal pain, anal bleeding, blood in stool, constipation, diarrhea, nausea and vomiting.  Genitourinary: Negative for difficulty urinating, dysuria, flank pain and hematuria.  Musculoskeletal: Negative for arthralgias, back pain, gait problem, joint swelling and myalgias.  Skin: Positive for wound. Negative for color change, pallor and rash.  Neurological: Negative for dizziness, tremors, weakness and light-headedness.  Hematological: Negative for adenopathy. Does not bruise/bleed easily.  Psychiatric/Behavioral: Negative for agitation, behavioral problems, confusion, decreased concentration, dysphoric mood and sleep disturbance.       Objective:   Physical Exam  Constitutional: He is oriented to person, place, and time.  HENT:  Head: Normocephalic and atraumatic.  Eyes: Conjunctivae and EOM are normal.  Neck: Normal range of motion. Neck supple.  Cardiovascular: Normal rate and regular rhythm.  Pulmonary/Chest: Effort normal. No respiratory distress. He has no wheezes.  Abdominal: Soft. He exhibits no distension.   Musculoskeletal: Normal range of motion.        General: No tenderness or edema.  Neurological: He is alert and oriented to person, place, and time.  Skin: Skin is warm.  Psychiatric: He has a normal mood and affect. His behavior is normal. Judgment and thought content normal.    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   CHronic hepatitis C without coma:cured   I spent greater than 25 minutes with the patient including greater than 50% of time in face to face counsel of the patient regarding all the above issues and in coordination of his care via telephonic Spanish translation.

## 2019-02-19 DIAGNOSIS — T782XXS Anaphylactic shock, unspecified, sequela: Secondary | ICD-10-CM | POA: Insufficient documentation

## 2019-05-18 ENCOUNTER — Other Ambulatory Visit: Payer: Self-pay

## 2019-06-07 ENCOUNTER — Other Ambulatory Visit: Payer: Self-pay | Admitting: *Deleted

## 2019-06-07 DIAGNOSIS — B20 Human immunodeficiency virus [HIV] disease: Secondary | ICD-10-CM

## 2019-06-07 MED ORDER — BIKTARVY 50-200-25 MG PO TABS: 1.0000 | ORAL_TABLET | Freq: Every day | ORAL | 5 refills | Status: DC

## 2019-06-09 ENCOUNTER — Ambulatory Visit (INDEPENDENT_AMBULATORY_CARE_PROVIDER_SITE_OTHER): Payer: Self-pay | Admitting: Infectious Disease

## 2019-06-09 ENCOUNTER — Other Ambulatory Visit: Payer: Self-pay

## 2019-06-09 ENCOUNTER — Encounter: Payer: Self-pay | Admitting: Infectious Disease

## 2019-06-09 VITALS — BP 158/78 | HR 88 | Temp 97.8°F | Resp 12 | Wt 137.0 lb

## 2019-06-09 DIAGNOSIS — Z89512 Acquired absence of left leg below knee: Secondary | ICD-10-CM

## 2019-06-09 DIAGNOSIS — M545 Low back pain, unspecified: Secondary | ICD-10-CM

## 2019-06-09 DIAGNOSIS — Z23 Encounter for immunization: Secondary | ICD-10-CM

## 2019-06-09 DIAGNOSIS — B9562 Methicillin resistant Staphylococcus aureus infection as the cause of diseases classified elsewhere: Secondary | ICD-10-CM

## 2019-06-09 DIAGNOSIS — B20 Human immunodeficiency virus [HIV] disease: Secondary | ICD-10-CM

## 2019-06-09 DIAGNOSIS — E1122 Type 2 diabetes mellitus with diabetic chronic kidney disease: Secondary | ICD-10-CM

## 2019-06-09 DIAGNOSIS — Z89511 Acquired absence of right leg below knee: Secondary | ICD-10-CM

## 2019-06-09 DIAGNOSIS — N186 End stage renal disease: Secondary | ICD-10-CM

## 2019-06-09 DIAGNOSIS — M549 Dorsalgia, unspecified: Secondary | ICD-10-CM

## 2019-06-09 DIAGNOSIS — R7881 Bacteremia: Secondary | ICD-10-CM

## 2019-06-09 DIAGNOSIS — T879 Unspecified complications of amputation stump: Secondary | ICD-10-CM

## 2019-06-09 DIAGNOSIS — Z992 Dependence on renal dialysis: Secondary | ICD-10-CM

## 2019-06-09 HISTORY — DX: Unspecified complications of amputation stump: T87.9

## 2019-06-09 HISTORY — DX: Dorsalgia, unspecified: M54.9

## 2019-06-09 MED ORDER — BIKTARVY 50-200-25 MG PO TABS: 1.0000 | ORAL_TABLET | Freq: Every day | ORAL | 11 refills | Status: DC

## 2019-06-09 NOTE — Progress Notes (Signed)
Chief complaint: Having problems with swelling in his right BKA site especially when it does not fit properly in his prosthesis.  He also is complaining of back pain during dialysis Subjective:    Patient ID: Alvin Daniels, male    DOB: Nov 02, 1970, 49 y.o.   MRN: [id number]  HPI  49 year old with HIV, HCV coinfection, DM, with ESRD on hemodialysis, Diabetic foot infections with gangrene requiring BKA now sp bilateral BKA's.  See my notes about his recent MRSA bacteremia.  He seems to be doing relatively well but has some complaints today.  When I asked him about his prosthesis and BKA sites he says that the new one on the right swells frequently particularly when it does not fit well with the prosthesis which she has a hard time getting the stump into.  He does not have fevers chills or other systemic symptoms and there is no wound visible.  He also is complaining of back pain that happens while he is being dialyzed but stops as soon as dialysis ceases.  He has no back pain whatsoever at other times.  He is highly adherent to his Biktarvy   .  Past Medical History:  Diagnosis Date  . AIDS ([place]) 11/22/2014  . Chronic diarrhea   . Chronic hepatitis C without hepatic coma ([place]) 11/22/2014  . 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 Diabetic neuropathy ([place])   . ESRD (end stage renal disease) on dialysis [facility]    "TTS; don't remember street name" (05/03/2014)  . Hepatitis C   . HIV INFECTION 06/27/2010   Qualifier: Diagnosis of  By: Nickola Major CMA ( [place]), Geni Bers    . Hypotension 06/02/2012  . Metabolic bone disease [id number]  . MRSA infection   . Normocytic anemia 06/17/2012  . Pancreatitis   . Pressure ulcer of BKA stump, stage 2 ([place]) 11/22/2014  . Renal disorder   . Severe protein-calorie malnutrition ([place]) 06/17/2012  . Uncontrolled diabetes mellitus with complications ([place]) 0000000   Annotation: uncontrolled Qualifier: Diagnosis of  By: Nickola Major CMA Deborra Medina), Geni Bers      Past Surgical History:  Procedure Laterality Date  . AMPUTATION Left 04/20/2014   Procedure: 3rd toe amputation, 4th Toe Amputation,  5th Toe Amputation;  Surgeon: Newt Minion, MD;  Location: [place];  Service: Orthopedics;  Laterality: Left;  . AMPUTATION Left 05/02/2014   Procedure: Midfoot Amputation;  Surgeon: Newt Minion, MD;  Location: [place];  Service: Orthopedics;  Laterality: Left;  . AMPUTATION Left 06/17/2014   Procedure: AMPUTATION BELOW KNEE;  Surgeon: Newt Minion, MD;  Location: [place];  Service: Orthopedics;  Laterality: Left;  . AMPUTATION Right 09/04/2018   Procedure: RIGHT BELOW KNEE AMPUTATION;  Surgeon: Newt Minion, MD;  Location: [place];  Service: Orthopedics;  Laterality: Right;  RIGHT BELOW KNEE AMPUTATION  . AV FISTULA PLACEMENT Left   . AV FISTULA PLACEMENT Left 05/10/2016   Procedure: Creation Left Arm Brachiocephalic Arteriovenous Fistula and Ligation of Radiocephalic Fistula;  Surgeon: Angelia Mould, MD;  Location: [place];  Service: Vascular;  Laterality: Left;  . FEMUR IM NAIL Left 08/17/2016   Procedure: INTRAMEDULLARY (IM) RETROGRADE FEMORAL NAILING;  Surgeon: Marybelle Killings, MD;  Location: [place];  Service: Orthopedics;  Laterality: Left;  . FOOT AMPUTATION THROUGH ANKLE Left 12/'21/2015   midfoot  . IR GENERIC HISTORICAL Left 05/01/2016   IR THROMBECTOMY AV FISTULA W/THROMBOLYSIS/PTA INC/SHUNT/IMG LEFT 05/01/2016 [name], MD [facility]  .  IR GENERIC HISTORICAL  05/01/2016   IR US GUIDE VASC ACCESS LEFT 05/01/2016 [name], MD [facility]  . IR GENERIC HISTORICAL  05/07/2016   IR FLUORO GUIDE CV LINE RIGHT 05/07/2016 Corrie Mckusick, DO [facility]  . IR GENERIC HISTORICAL  05/07/2016   IR US GUIDE VASC ACCESS RIGHT 05/07/2016 Corrie Mckusick, DO [facility]  . IR GENERIC HISTORICAL  05/22/2016   IR US GUIDE VASC ACCESS RIGHT 05/22/2016 Greggory Keen, MD [facility]  . IR GENERIC HISTORICAL  05/22/2016   IR FLUORO  GUIDE CV LINE RIGHT 05/22/2016 Greggory Keen, MD [facility]  . IR REMOVAL TUN CV CATH W/O FL  08/21/2016  . PERIPHERAL VASCULAR CATHETERIZATION Left 05/09/2016   Procedure: A/V Fistulagram;  Surgeon: Angelia Mould, MD;  Location: [place] CV LAB;  Service: Cardiovascular;  Laterality: Left;  arm  . TEE WITHOUT CARDIOVERSION N/A 06/22/2018   Procedure: TRANSESOPHAGEAL ECHOCARDIOGRAM (TEE);  Surgeon: Jerline Pain, MD;  Location: [facility] ENDOSCOPY;  Service: Cardiovascular;  Laterality: N/A;    Family History  Problem Relation Age of Onset  . Diabetes Mother   . Diabetes Father       Social History   Socioeconomic History  . Marital status: Single    Spouse name: Not on file  . Number of children: Not on file  . Years of education: Not on file  . Highest education level: Not on file  Occupational History  . Not on file  Tobacco Use  . Smoking status: Never Smoker  . Smokeless tobacco: Never Used  Substance and Sexual Activity  . Alcohol use: No  . Drug use: No  . Sexual activity: Never  Other Topics Concern  . Not on file  Social History Narrative   ** Merged History Encounter **       Social Determinants of Health   Financial Resource Strain:   . Difficulty of Paying Living Expenses: Not on file  Food Insecurity:   . Worried About Charity fundraiser in the Last Year: Not on file  . Ran Out of Food in the Last Year: Not on file  Transportation Needs:   . Lack of Transportation (Medical): Not on file  . Lack of Transportation (Non-Medical): Not on file  Physical Activity:   . Days of Exercise per Week: Not on file  . Minutes of Exercise per Session: Not on file  Stress:   . Feeling of Stress : Not on file  Social Connections:   . Frequency of Communication with Friends and Family: Not on file  . Frequency of Social Gatherings with Friends and Family: Not on file  . Attends Religious Services: Not on file  . Active Member of Clubs or Organizations: Not on  file  . Attends Archivist Meetings: Not on file  . Marital Status: Not on file    No Known Allergies   Current Outpatient Medications:  .  acetaminophen (TYLENOL) 325 MG tablet, Take 2 tablets (650 mg total) by mouth every 6 (six) hours as needed for mild pain., Disp:  , Rfl:  .  bictegravir-emtricitabine-tenofovir AF (BIKTARVY) 50-200-25 MG TABS tablet, Take 1 tablet by mouth daily., Disp: 30 tablet, Rfl: 5 .  insulin NPH-regular Human (70-30) 100 UNIT/ML injection, Inject 15 Units into the skin 2 (two) times daily with a meal., Disp: 30 mL, Rfl: 6 .  Insulin Syringe-Needle U-100 (BD INSULIN SYRINGE ULTRAFINE) 31G X 15/64" 0.5 ML MISC, 1 each by Does not apply route  2 (two) times daily. Please dispense needles as well., Disp: 60 each, Rfl: 5 .  midodrine (PROAMATINE) 10 MG tablet, Take 1 tablet (10 mg total) by mouth Every Tuesday,Thursday,and Saturday with dialysis., Disp: 15 tablet, Rfl: 0 .  Nutritional Supplements (FEEDING SUPPLEMENT, NEPRO CARB STEADY,) LIQD, Take 237 mLs by mouth 2 (two) times daily between meals., Disp: 60 Can, Rfl: 0 .  TRUEplus Lancets 28G MISC, 1 each by Does not apply route 3 (three) times daily., Disp: 100 each, Rfl: 11 .  Amino Acids-Protein Hydrolys (FEEDING SUPPLEMENT, PRO-STAT SUGAR FREE 64,) LIQD, Take 30 mLs by mouth 2 (two) times daily. (Patient not taking: Reported on 06/09/2019), Disp: 887 mL, Rfl: 0 .  Blood Glucose Monitoring Suppl (TRUE METRIX METER) DEVI, 1 each by Does not apply route 3 (three) times daily. (Patient not taking: Reported on 06/09/2019), Disp: 1 Device, Rfl: 0 .  docusate sodium (COLACE) 100 MG capsule, Take 1 capsule (100 mg total) by mouth 2 (two) times daily as needed for mild constipation. (Patient not taking: Reported on 06/09/2019), Disp: 10 capsule, Rfl: 0 .  glucose blood (TRUE METRIX BLOOD GLUCOSE TEST) test strip, 1 each by Other route 3 (three) times daily., Disp: 100 each, Rfl: 12    Review of Systems   Constitutional: Negative for activity change, appetite change, chills, diaphoresis, fatigue, fever and unexpected weight change.  HENT: Negative for congestion, rhinorrhea, sinus pressure, sneezing, sore throat and trouble swallowing.   Eyes: Negative for photophobia and visual disturbance.  Respiratory: Negative for cough, chest tightness, shortness of breath, wheezing and stridor.   Cardiovascular: Negative for chest pain, palpitations and leg swelling.  Gastrointestinal: Negative for abdominal distention, abdominal pain, anal bleeding, blood in stool, constipation, diarrhea, nausea and vomiting.  Genitourinary: Negative for difficulty urinating, dysuria, flank pain and hematuria.  Musculoskeletal: Positive for back pain and myalgias. Negative for arthralgias, gait problem and joint swelling.  Skin: Negative for color change, pallor, rash and wound.  Neurological: Negative for dizziness, tremors, weakness and light-headedness.  Hematological: Negative for adenopathy. Does not bruise/bleed easily.  Psychiatric/Behavioral: Negative for agitation, behavioral problems, confusion, decreased concentration, dysphoric mood and sleep disturbance.       Objective:   Physical Exam  Constitutional: He is oriented to person, place, and time.  HENT:  Head: Normocephalic and atraumatic.  Eyes: Conjunctivae and EOM are normal.  Cardiovascular: Normal rate and regular rhythm.  Pulmonary/Chest: Effort normal. No respiratory distress. He has no wheezes.  Abdominal: Soft. He exhibits no distension.  Musculoskeletal:        General: No tenderness or edema. Normal range of motion.     Cervical back: Normal range of motion and neck supple.  Neurological: He is alert and oriented to person, place, and time.  Skin: Skin is warm.  Psychiatric: He has a normal mood and affect. His behavior is normal. Judgment and thought content normal.    BKA site is clean dry and intact see picture January 27, 2019:     Right BKA site today June 09, 2019:           Assessment & Plan:   Swelling and pain at his right BKA site: Likely mainly due to poor fit here but given his history of recurrent bacteremias and infection I am going to err on the side of being aggressive and get an MRI of the site and refer him to Dr. Sharol Given certainly he needs to be better fitted at minimum for the prosthesis.  MRSA  bacteremia: Resolved  Back pain: Not sure why he is having this while dialysis is having given the fact he is not having it any other time I do not see a reason to pursue work-up for infection in the spine  HIV/AIDS: Continue Biktarvy and HMA P is renewed we will bring him back to clinic in July for repeat labs and renewal of medication assistance plan.   CHronic hepatitis C without coma:cured

## 2019-06-10 LAB — T-HELPER CELL (CD4) - (RCID CLINIC ONLY)
CD4 % Helper T Cell: 20 % — ABNORMAL LOW (ref 33–65)
CD4 T Cell Abs: 324 /uL — ABNORMAL LOW (ref 400–1790)

## 2019-06-12 LAB — CBC WITH DIFFERENTIAL/PLATELET
Absolute Monocytes: 561 cells/uL (ref 200–950)
Basophils Absolute: 71 cells/uL (ref 0–200)
Basophils Relative: 1.2 %
Eosinophils Absolute: 596 cells/uL — ABNORMAL HIGH (ref 15–500)
Eosinophils Relative: 10.1 %
HCT: 35.4 % — ABNORMAL LOW (ref 38.5–50.0)
Hemoglobin: 11.8 g/dL — ABNORMAL LOW (ref 13.2–17.1)
Lymphs Abs: 1410 cells/uL (ref 850–3900)
MCH: 32.8 pg (ref 27.0–33.0)
MCHC: 33.3 g/dL (ref 32.0–36.0)
MCV: 98.3 fL (ref 80.0–100.0)
MPV: 10 fL (ref 7.5–12.5)
Monocytes Relative: 9.5 %
Neutro Abs: 3263 cells/uL (ref 1500–7800)
Neutrophils Relative %: 55.3 %
Platelets: 168 10*3/uL (ref 140–400)
RBC: 3.6 10*6/uL — ABNORMAL LOW (ref 4.20–5.80)
RDW: 14.4 % (ref 11.0–15.0)
Total Lymphocyte: 23.9 %
WBC: 5.9 10*3/uL (ref 3.8–10.8)

## 2019-06-12 LAB — C-REACTIVE PROTEIN: CRP: 11.3 mg/L — ABNORMAL HIGH (ref <8.0)

## 2019-06-12 LAB — SEDIMENTATION RATE: Sed Rate: 39 mm/h — ABNORMAL HIGH (ref 0–15)

## 2019-06-12 LAB — RPR: RPR Ser Ql: NONREACTIVE

## 2019-06-12 LAB — HIV-1 RNA QUANT-NO REFLEX-BLD
HIV 1 RNA Quant: <20 copies/mL
HIV-1 RNA Quant, Log: <1.3 Log copies/mL

## 2019-06-15 ENCOUNTER — Ambulatory Visit (HOSPITAL_COMMUNITY): Admission: RE | Admit: 2019-06-15 | Payer: Self-pay | Source: Ambulatory Visit

## 2019-06-15 ENCOUNTER — Ambulatory Visit: Payer: Self-pay | Admitting: Orthopedic Surgery

## 2019-06-21 ENCOUNTER — Emergency Department (HOSPITAL_COMMUNITY): Payer: Self-pay

## 2019-06-21 ENCOUNTER — Other Ambulatory Visit: Payer: Self-pay

## 2019-06-21 ENCOUNTER — Emergency Department (HOSPITAL_COMMUNITY)
Admission: EM | Admit: 2019-06-21 | Discharge: 2019-06-21 | Disposition: A | Discharge status: Home / Self Care | Payer: Self-pay | Attending: Emergency Medicine | Admitting: Emergency Medicine

## 2019-06-21 ENCOUNTER — Encounter (HOSPITAL_COMMUNITY): Payer: Self-pay | Admitting: Emergency Medicine

## 2019-06-21 DIAGNOSIS — N186 End stage renal disease: Secondary | ICD-10-CM | POA: Insufficient documentation

## 2019-06-21 DIAGNOSIS — Z992 Dependence on renal dialysis: Secondary | ICD-10-CM | POA: Insufficient documentation

## 2019-06-21 DIAGNOSIS — E1122 Type 2 diabetes mellitus with diabetic chronic kidney disease: Secondary | ICD-10-CM | POA: Insufficient documentation

## 2019-06-21 DIAGNOSIS — R519 Headache, unspecified: Secondary | ICD-10-CM | POA: Insufficient documentation

## 2019-06-21 DIAGNOSIS — Z21 Asymptomatic human immunodeficiency virus [HIV] infection status: Secondary | ICD-10-CM | POA: Insufficient documentation

## 2019-06-21 DIAGNOSIS — Z794 Long term (current) use of insulin: Secondary | ICD-10-CM | POA: Insufficient documentation

## 2019-06-21 DIAGNOSIS — Z79899 Other long term (current) drug therapy: Secondary | ICD-10-CM | POA: Insufficient documentation

## 2019-06-21 LAB — BASIC METABOLIC PANEL
Anion gap: 13 (ref 5–15)
BUN: 34 mg/dL — ABNORMAL HIGH (ref 6–20)
CO2: 27 mmol/L (ref 22–32)
Calcium: 8 mg/dL — ABNORMAL LOW (ref 8.9–10.3)
Chloride: 94 mmol/L — ABNORMAL LOW (ref 98–111)
Creatinine, Ser: 7.22 mg/dL — ABNORMAL HIGH (ref 0.61–1.24)
GFR calc Af Amer: 9 mL/min — ABNORMAL LOW (ref >60)
GFR calc non Af Amer: 8 mL/min — ABNORMAL LOW (ref >60)
Glucose, Bld: 110 mg/dL — ABNORMAL HIGH (ref 70–99)
Potassium: 4.9 mmol/L (ref 3.5–5.1)
Sodium: 134 mmol/L — ABNORMAL LOW (ref 135–145)

## 2019-06-21 LAB — CBC WITH DIFFERENTIAL/PLATELET
Abs Immature Granulocytes: 0.01 10*3/uL (ref 0.00–0.07)
Basophils Absolute: 0.1 10*3/uL (ref 0.0–0.1)
Basophils Relative: 1 %
Eosinophils Absolute: 0.6 10*3/uL — ABNORMAL HIGH (ref 0.0–0.5)
Eosinophils Relative: 9 %
HCT: 36.7 % — ABNORMAL LOW (ref 39.0–52.0)
Hemoglobin: 12.5 g/dL — ABNORMAL LOW (ref 13.0–17.0)
Immature Granulocytes: 0 %
Lymphocytes Relative: 20 %
Lymphs Abs: 1.2 10*3/uL (ref 0.7–4.0)
MCH: 33.2 pg (ref 26.0–34.0)
MCHC: 34.1 g/dL (ref 30.0–36.0)
MCV: 97.6 fL (ref 80.0–100.0)
Monocytes Absolute: 0.5 10*3/uL (ref 0.1–1.0)
Monocytes Relative: 8 %
Neutro Abs: 3.8 10*3/uL (ref 1.7–7.7)
Neutrophils Relative %: 62 %
Platelets: 198 10*3/uL (ref 150–400)
RBC: 3.76 MIL/uL — ABNORMAL LOW (ref 4.22–5.81)
RDW: 13.8 % (ref 11.5–15.5)
WBC: 6.1 10*3/uL (ref 4.0–10.5)
nRBC: 0 % (ref 0.0–0.2)

## 2019-06-21 LAB — SEDIMENTATION RATE: Sed Rate: 46 mm/hr — ABNORMAL HIGH (ref 0–16)

## 2019-06-21 LAB — C-REACTIVE PROTEIN: CRP: <0.5 mg/dL (ref <1.0)

## 2019-06-21 MED ORDER — ONDANSETRON 4 MG PO TBDP: 4.0000 mg | ORAL_TABLET | Freq: Three times a day (TID) | ORAL | 0 refills | Status: DC | PRN

## 2019-06-21 MED ORDER — ONDANSETRON 4 MG PO TBDP
4.0000 mg | ORAL_TABLET | Freq: Once | ORAL | Status: AC
  Administered 2019-06-21: 4 mg via ORAL
  Filled 2019-06-21: qty 1

## 2019-06-21 NOTE — ED Notes (Signed)
RN attempted to complete visual acuity screening w/ use of interpreter. PT reported blurry vision in both eyes and stated he couldn't read any of the letters.

## 2019-06-21 NOTE — ED Triage Notes (Signed)
Pt here from dialysis where he received 2 hr and 27 mins of treatment. Pt co HA and N/V x2 weeks. Pt only threw up today bc dialysis gave pt orak meds on an empty stomach. Pt co  Blurred vision in the rt eye that is chronic and uses eye drops that help. HX. Bilateral BKA.

## 2019-06-21 NOTE — ED Notes (Signed)
Pt given crackers and water. 

## 2019-06-21 NOTE — ED Notes (Signed)
Pt given water for fluid/po challenge

## 2019-06-21 NOTE — Discharge Instructions (Addendum)
We recommend going to your regularly scheduled dialysis appointment on Wednesday. Follow up with your primary care provider or neurology regarding your headaches. You should also see an ophthalmologist.  Call to make an appointment. Should any symptoms, such as shortness of breath, swelling, weakness, or other issues arise, call your nephrologist or dialysis center, or return to the ED.A

## 2019-06-21 NOTE — Progress Notes (Signed)
Renal Navigator received call from ED PA stating patient came from HD after 2.5 hours of treatment. PA states patient is cleared for discharged and asked if patient needs to return to HD. Navigator discussed with Renal PA/D. Zeyfang and it was determined that patient can wait until next regularly scheduled appointment on Wednesday, 06/23/19 for next HD treatment.  Alphonzo Cruise, [place] Renal Navigator [phone]

## 2019-06-21 NOTE — ED Provider Notes (Signed)
[facility] EMERGENCY DEPARTMENT Provider Note   CSN: [id number] Arrival date & time: 06/21/19  [id number]     History Chief Complaint  Patient presents with  . Headache    Alvin Daniels is a 49 y.o. male.  The history is provided by the patient. A language interpreter was used ([place]).        Alvin Daniels is a 49 y.o. male, with a history of HIV, hepatitis C, ESRD on dialysis, DM, presenting to the ED with headache intermittent for the last week.  Pain is to the right side of the head and around the eye, lasts for several minutes at a time, mostly present when he walks from the inside to the outside, 5/10, nonradiating.  He also states he will occasionally have blurry vision in the right eye.  He thinks some of his symptoms have to do with going from a warm environment into a cold environment. He was 2.5 hours into his dialysis session today, was given some Tylenol for headache, and then vomited.  He states this occurred because he did not have anything on his stomach.   Denies fever/chills, vision loss, dizziness, trauma/falls, nausea, other vomiting, diarrhea, abdominal pain, chest pain, shortness of breath, cough, other neuro deficits, or any other complaints.  HIV labs were last checked June 09, 2019.  HIV quant undetectable, CD4 count 324.  Past Medical History:  Diagnosis Date  . AIDS ([place]) 11/22/2014  . Back pain 06/09/2019  . BKA stump complication ([place]) [id number]  . Chronic diarrhea   . Chronic hepatitis C without hepatic coma ([place]) 11/22/2014  . 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 Diabetic neuropathy ([place])   . ESRD (end stage renal disease) on dialysis [facility])    "TTS; don't remember street name" (05/03/2014)  . Hepatitis C   . HIV INFECTION 06/27/2010   Qualifier: Diagnosis of  By: Nickola Major CMA ( [place]), Geni Bers    . Hypotension 06/02/2012  . Metabolic bone disease [id number]  . MRSA infection   . Normocytic anemia  06/17/2012  . Pancreatitis   . Pressure ulcer of BKA stump, stage 2 ([place]) 11/22/2014  . Renal disorder   . Severe protein-calorie malnutrition ([place]) 06/17/2012  . Uncontrolled diabetes mellitus with complications ([place]) 0000000   Annotation: uncontrolled Qualifier: Diagnosis of  By: Nickola Major CMA Deborra Medina), Jacqueline      Patient Active Problem List   Diagnosis Date Noted  . BKA stump complication ([place]) [id number]  . Back pain 06/09/2019  . Abscess of foot   . Abscess of ankle 08/29/2018  . Charcot foot due to diabetes mellitus ([place]) 07/23/2018  . MRSA bacteremia   . Diabetic foot ulcer ([place]) 11/19/2017  . Femur fracture, left ([employer]) 08/16/2016  . HIV (human immunodeficiency virus infection) ([place]) 08/16/2016  . GERD (gastroesophageal reflux disease) 05/24/2016  . Burn 04/10/2016  . Pressure ulcer of BKA stump, stage 2 ([place]) 11/22/2014  . Chronic hepatitis C without hepatic coma ([place]) 11/22/2014  . S/P bilateral BKA (below knee amputation) ([place]) 06/17/2014  . HIV disease ([place])   . Poor dentition 10/25/2013  . ESRD on dialysis ([place]) 10/25/2013  . Vision changes 06/21/2013  . Orthostatic headache 04/15/2013  . Anemia of chronic renal failure 06/19/2012  . Metabolic bone disease [id number]  . Chronic diarrhea 06/17/2012  . Severe protein-calorie malnutrition ([place]) 06/17/2012  . Normocytic anemia 06/17/2012  . Polyneuropathy in diabetes(357.2) 06/27/2010  . Diabetes mellitus with ESRD (end-stage renal disease) ([place]) 01/03/2010  Past Surgical History:  Procedure Laterality Date  . AMPUTATION Left 04/20/2014   Procedure: 3rd toe amputation, 4th Toe Amputation,  5th Toe Amputation;  Surgeon: Newt Minion, MD;  Location: [place];  Service: Orthopedics;  Laterality: Left;  . AMPUTATION Left 05/02/2014   Procedure: Midfoot Amputation;  Surgeon: Newt Minion, MD;  Location: [place];  Service: Orthopedics;  Laterality: Left;  . AMPUTATION Left 06/17/2014   Procedure: AMPUTATION BELOW KNEE;   Surgeon: Newt Minion, MD;  Location: [place];  Service: Orthopedics;  Laterality: Left;  . AMPUTATION Right 09/04/2018   Procedure: RIGHT BELOW KNEE AMPUTATION;  Surgeon: Newt Minion, MD;  Location: [place];  Service: Orthopedics;  Laterality: Right;  RIGHT BELOW KNEE AMPUTATION  . AV FISTULA PLACEMENT Left   . AV FISTULA PLACEMENT Left 05/10/2016   Procedure: Creation Left Arm Brachiocephalic Arteriovenous Fistula and Ligation of Radiocephalic Fistula;  Surgeon: Angelia Mould, MD;  Location: [place];  Service: Vascular;  Laterality: Left;  . FEMUR IM NAIL Left 08/17/2016   Procedure: INTRAMEDULLARY (IM) RETROGRADE FEMORAL NAILING;  Surgeon: Marybelle Killings, MD;  Location: [place];  Service: Orthopedics;  Laterality: Left;  . FOOT AMPUTATION THROUGH ANKLE Left 12/'21/2015   midfoot  . IR GENERIC HISTORICAL Left 05/01/2016   IR THROMBECTOMY AV FISTULA W/THROMBOLYSIS/PTA INC/SHUNT/IMG LEFT 05/01/2016 [name], MD [facility]  . IR GENERIC HISTORICAL  05/01/2016   IR US GUIDE VASC ACCESS LEFT 05/01/2016 [name], MD [facility]  . IR GENERIC HISTORICAL  05/07/2016   IR FLUORO GUIDE CV LINE RIGHT 05/07/2016 Corrie Mckusick, DO [facility]  . IR GENERIC HISTORICAL  05/07/2016   IR US GUIDE VASC ACCESS RIGHT 05/07/2016 Corrie Mckusick, DO [facility]  . IR GENERIC HISTORICAL  05/22/2016   IR US GUIDE VASC ACCESS RIGHT 05/22/2016 Greggory Keen, MD [facility]  . IR GENERIC HISTORICAL  05/22/2016   IR FLUORO GUIDE CV LINE RIGHT 05/22/2016 Greggory Keen, MD [facility]  . IR REMOVAL TUN CV CATH W/O FL  08/21/2016  . PERIPHERAL VASCULAR CATHETERIZATION Left 05/09/2016   Procedure: A/V Fistulagram;  Surgeon: Angelia Mould, MD;  Location: [place] CV LAB;  Service: Cardiovascular;  Laterality: Left;  arm  . TEE WITHOUT CARDIOVERSION N/A 06/22/2018   Procedure: TRANSESOPHAGEAL ECHOCARDIOGRAM (TEE);  Surgeon: Jerline Pain, MD;  Location: [facility] ENDOSCOPY;  Service: Cardiovascular;   Laterality: N/A;       Family History  Problem Relation Age of Onset  . Diabetes Mother   . Diabetes Father     Social History   Tobacco Use  . Smoking status: Never Smoker  . Smokeless tobacco: Never Used  Substance Use Topics  . Alcohol use: No  . Drug use: No    Home Medications Prior to Admission medications   Medication Sig Start Date End Date Taking? Authorizing Provider  acetaminophen (TYLENOL) 325 MG tablet Take 2 tablets (650 mg total) by mouth every 6 (six) hours as needed for mild pain. 11/24/18   Carmin Muskrat, MD  Amino Acids-Protein Hydrolys (FEEDING SUPPLEMENT, PRO-STAT SUGAR FREE 64,) LIQD Take 30 mLs by mouth 2 (two) times daily. Patient not taking: Reported on 06/09/2019 09/07/18   Lavina Hamman, MD  bictegravir-emtricitabine-tenofovir AF (BIKTARVY) 50-200-25 MG TABS tablet Take 1 tablet by mouth daily. 06/09/19   Truman Hayward, MD  Blood Glucose Monitoring Suppl (TRUE METRIX METER) DEVI 1 each by Does not apply route 3 (three) times daily. Patient not taking:  Reported on 06/09/2019 09/15/18   [name], MD  docusate sodium (COLACE) 100 MG capsule Take 1 capsule (100 mg total) by mouth 2 (two) times daily as needed for mild constipation. Patient not taking: Reported on 06/09/2019 09/07/18   Lavina Hamman, MD  glucose blood (TRUE METRIX BLOOD GLUCOSE TEST) test strip 1 each by Other route 3 (three) times daily. 09/15/18   [name], MD  insulin NPH-regular Human (70-30) 100 UNIT/ML injection Inject 15 Units into the skin 2 (two) times daily with a meal. 09/28/18   [name], MD  Insulin Syringe-Needle U-100 (BD INSULIN SYRINGE ULTRAFINE) 31G X 15/64" 0.5 ML MISC 1 each by Does not apply route 2 (two) times daily. Please dispense needles as well. 09/28/18   [name], MD  midodrine (PROAMATINE) 10 MG tablet Take 1 tablet (10 mg total) by mouth Every Tuesday,Thursday,and Saturday with dialysis. 09/08/18   Lavina Hamman, MD  Nutritional  Supplements (FEEDING SUPPLEMENT, NEPRO CARB STEADY,) LIQD Take 237 mLs by mouth 2 (two) times daily between meals. 09/07/18   Lavina Hamman, MD  ondansetron (ZOFRAN ODT) 4 MG disintegrating tablet Take 1 tablet (4 mg total) by mouth every 8 (eight) hours as needed for nausea or vomiting. 06/21/19   Trystin Terhune C, PA-C  TRUEplus Lancets 28G MISC 1 each by Does not apply route 3 (three) times daily. 09/15/18   [name], MD    Allergies    Patient has no known allergies.  Review of Systems   Review of Systems  Constitutional: Negative for chills, diaphoresis and fever.  HENT: Negative for facial swelling.   Eyes: Positive for visual disturbance.  Respiratory: Negative for cough and shortness of breath.   Cardiovascular: Negative for chest pain.  Gastrointestinal: Positive for vomiting. Negative for abdominal pain, diarrhea and nausea.  Musculoskeletal: Negative for back pain and neck pain.  Neurological: Positive for headaches. Negative for dizziness.  All other systems reviewed and are negative.   Physical Exam Updated Vital Signs BP 121/75   Pulse 94   Temp 98.1 F (36.7 C) (Oral)   Resp 20   SpO2 100%   Physical Exam Vitals and nursing note reviewed.  Constitutional:      General: He is not in acute distress.    Appearance: He is well-developed. He is not diaphoretic.  HENT:     Head: Normocephalic and atraumatic.     Mouth/Throat:     Mouth: Mucous membranes are moist.     Pharynx: Oropharynx is clear.  Eyes:     Conjunctiva/sclera: Conjunctivae normal.     Comments: No contact lenses in place.  EOMs are intact and in sync.  No pain with EOMs.  No noted eye or lid ptosis.  Pupils are PERRL.    Visual Acuity  Right Eye Distance: 10/80 Left Eye Distance: 10/80 Bilateral Distance: 10/80  Right Eye Near:   Left Eye Near:    Bilateral Near:       Cardiovascular:     Rate and Rhythm: Normal rate and regular rhythm.     Pulses: Normal pulses.          Radial  pulses are 2+ on the right side and 2+ on the left side.     Heart sounds: Normal heart sounds.     Comments: Tactile temperature in the upper extremities appropriate and equal bilaterally. Pulmonary:     Effort: Pulmonary effort is normal. No respiratory distress.     Breath sounds: Normal  breath sounds.  Abdominal:     Palpations: Abdomen is soft.     Tenderness: There is no abdominal tenderness. There is no guarding.  Musculoskeletal:     Cervical back: Neck supple.     Right Lower Extremity: Right leg is amputated below knee.     Left Lower Extremity: Left leg is amputated below knee.  Lymphadenopathy:     Cervical: No cervical adenopathy.  Skin:    General: Skin is warm and dry.  Neurological:     Mental Status: He is alert and oriented to person, place, and time.     Comments: No noted acute cognitive deficit. Sensation grossly intact to light touch in the extremities (tested in the upper legs in the lower extremities).   Grip strengths equal bilaterally.   Strength 5/5 in all extremities.  No acute gait disturbance. Coordination intact.  Cranial nerves III-XII grossly intact.  Handles oral secretions without noted difficulty.  No noted phonation or speech deficit. No facial droop.   Psychiatric:        Mood and Affect: Mood and affect normal.        Speech: Speech normal.        Behavior: Behavior normal.     ED Results / Procedures / Treatments   Labs (all labs ordered are listed, but only abnormal results are displayed) Labs Reviewed  BASIC METABOLIC PANEL - Abnormal; Notable for the following components:      Result Value   Sodium 134 (*)    Chloride 94 (*)    Glucose, Bld 110 (*)    BUN 34 (*)    Creatinine, Ser 7.22 (*)    Calcium 8.0 (*)    GFR calc non Af Amer 8 (*)    GFR calc Af Amer 9 (*)    All other components within normal limits  CBC WITH DIFFERENTIAL/PLATELET - Abnormal; Notable for the following components:   RBC 3.76 (*)    Hemoglobin 12.5  (*)    HCT 36.7 (*)    Eosinophils Absolute 0.6 (*)    All other components within normal limits  SEDIMENTATION RATE - Abnormal; Notable for the following components:   Sed Rate 46 (*)    All other components within normal limits  C-REACTIVE PROTEIN    EKG None  Radiology CT Head Wo Contrast  Result Date: 06/21/2019 CLINICAL DATA:  Headache with blurred vision EXAM: CT HEAD WITHOUT CONTRAST TECHNIQUE: Contiguous axial images were obtained from the base of the skull through the vertex without intravenous contrast. COMPARISON:  Sep 27, 2013 FINDINGS: Brain: Ventricles and sulci are normal in size and configuration. There is no intracranial mass, hemorrhage, extra-axial fluid collection, or midline shift. Brain parenchyma appears unremarkable. No acute infarct evident. Vascular: No hyperdense vessel. There is calcification in each carotid siphon region. Skull: Bony calvarium appears intact. Sinuses/Orbits: There is mucosal thickening in several ethmoid air cells. Other paranasal sinuses are clear. There is leftward deviation of the mid nasal septum. Orbits appear symmetric bilaterally. Other: Mastoid air cells are clear. IMPRESSION: Brain parenchyma appears unremarkable. No acute infarct evident. No mass or hemorrhage. There are foci of arterial vascular calcification. Mucosal thickening noted in several ethmoid air cells. There is nasal septal deviation. Electronically Signed   By: Lowella Grip III M.D.   On: 06/21/2019 12:44    Procedures Procedures (including critical care time)  Medications Ordered in ED Medications  ondansetron (ZOFRAN-ODT) disintegrating tablet 4 mg (4 mg Oral Given 06/21/19 1152)  ED Course  I have reviewed the triage vital signs and the nursing notes.  Pertinent labs & imaging results that were available during my care of the patient were reviewed by me and considered in my medical decision making (see chart for details).  Clinical Course as of Jun 20 1505    Mon Jun 21, 2019  1052 Attempted to call dialysis coordinator.    [SJ]  1300 Grossly stable from value of 39, 12 days ago.  Sed Rate(!): 46 [SJ]  1305 Improved from 11.3, 12 days ago.  CRP: <0.5 [SJ]  1401 Spoke with Terri Piedra, Renal Navigator. She also spoke with Renal PA, Shanon Brow, while I was on the phone.  We discussed the patient's presentation, his complaints, and plan of care.  They if patient is in no distress and has no current complaints, he can go to his next scheduled dialysis appointment on Wednesday.  Should anything change, he should call his dialysis center tomorrow.   [SJ]    Clinical Course User Index [SJ] Quanisha Drewry, Helane Gunther, PA-C   MDM Rules/Calculators/A&P                      Patient presents with complaint of intermittent headache for the last week.  Patient is nontoxic appearing, afebrile, not tachycardic, not tachypneic, not hypotensive, maintains excellent SPO2 on room air, and is in no apparent distress.  No focal neurologic deficits noted on exam.  No red flag symptoms.  Patient actually had no complaint of headache during his time in the ED. Sedimentation rate grossly stable.  CRP improved.  No acute abnormalities on CT. We will have him follow-up with PCP or neurology for his headaches.  I also recommended he follow-up with ophthalmology.  Findings and plan of care discussed with Delphina Cahill, MD.    Vitals:   06/21/19 1215 06/21/19 1230 06/21/19 1331 06/21/19 1333  BP: 103/64 103/67 121/60   Pulse:      Resp: 12 (!) 24  17  Temp:      TempSrc:      SpO2:          Final Clinical Impression(s) / ED Diagnoses Final diagnoses:  Recurrent headache    Rx / DC Orders ED Discharge Orders         Ordered    ondansetron (ZOFRAN ODT) 4 MG disintegrating tablet  Every 8 hours PRN     06/21/19 1337           Lorayne Bender, PA-C 06/21/19 1514    Davonna Belling, MD 06/21/19 1534

## 2019-08-24 DIAGNOSIS — Z888 Allergy status to other drugs, medicaments and biological substances status: Secondary | ICD-10-CM | POA: Insufficient documentation

## 2019-09-21 ENCOUNTER — Encounter: Payer: Self-pay | Admitting: General Surgery

## 2019-09-21 ENCOUNTER — Ambulatory Visit: Payer: Self-pay | Attending: General Surgery | Admitting: General Surgery

## 2019-09-21 ENCOUNTER — Other Ambulatory Visit: Payer: Self-pay

## 2019-09-21 VITALS — BP 140/82 | HR 84 | Temp 98.1°F | Resp 16 | Wt 125.0 lb

## 2019-09-21 DIAGNOSIS — Z8614 Personal history of Methicillin resistant Staphylococcus aureus infection: Secondary | ICD-10-CM | POA: Insufficient documentation

## 2019-09-21 DIAGNOSIS — A4902 Methicillin resistant Staphylococcus aureus infection, unspecified site: Secondary | ICD-10-CM

## 2019-09-21 DIAGNOSIS — L089 Local infection of the skin and subcutaneous tissue, unspecified: Secondary | ICD-10-CM

## 2019-09-21 NOTE — Progress Notes (Signed)
Acute Office Visit  Subjective:    Patient ID: Alvin Daniels, male    DOB: Sep 25, 1970, 49 y.o.   MRN: [id number]  No chief complaint on file.   HPI Patient is in today for infections around the face and scalp.  History of MRSA infecitons.  Here for evaluation of infection of left frontal scalp  Past Medical History:  Diagnosis Date  . AIDS ([place]) 11/22/2014  . Back pain 06/09/2019  . BKA stump complication ([place]) 9/92/4268  . Chronic diarrhea   . Chronic hepatitis C without hepatic coma ([employer]) 11/22/2014  . 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 Diabetic neuropathy ([place])   . ESRD (end stage renal disease) on dialysis [facility])    "TTS; don't remember street name" (05/03/2014)  . Hepatitis C   . HIV INFECTION 06/27/2010   Qualifier: Diagnosis of  By: Nickola Major CMA ( [place]), Geni Bers    . Hypotension 06/02/2012  . Metabolic bone disease 07/14/1960  . MRSA infection   . Normocytic anemia 06/17/2012  . Pancreatitis   . Pressure ulcer of BKA stump, stage 2 ([place]) 11/22/2014  . Renal disorder   . Severe protein-calorie malnutrition ([place]) 06/17/2012  . Uncontrolled diabetes mellitus with complications ([place]) 2/29/7989   Annotation: uncontrolled Qualifier: Diagnosis of  By: Nickola Major CMA Deborra Medina), Geni Bers      Past Surgical History:  Procedure Laterality Date  . AMPUTATION Left 04/20/2014   Procedure: 3rd toe amputation, 4th Toe Amputation,  5th Toe Amputation;  Surgeon: Newt Minion, MD;  Location: [place];  Service: Orthopedics;  Laterality: Left;  . AMPUTATION Left 05/02/2014   Procedure: Midfoot Amputation;  Surgeon: Newt Minion, MD;  Location: [place];  Service: Orthopedics;  Laterality: Left;  . AMPUTATION Left 06/17/2014   Procedure: AMPUTATION BELOW KNEE;  Surgeon: Newt Minion, MD;  Location: [place];  Service: Orthopedics;  Laterality: Left;  . AMPUTATION Right 09/04/2018   Procedure: RIGHT BELOW KNEE AMPUTATION;  Surgeon: Newt Minion, MD;  Location: [place];  Service:  Orthopedics;  Laterality: Right;  RIGHT BELOW KNEE AMPUTATION  . AV FISTULA PLACEMENT Left   . AV FISTULA PLACEMENT Left 05/10/2016   Procedure: Creation Left Arm Brachiocephalic Arteriovenous Fistula and Ligation of Radiocephalic Fistula;  Surgeon: Angelia Mould, MD;  Location: [place];  Service: Vascular;  Laterality: Left;  . FEMUR IM NAIL Left 08/17/2016   Procedure: INTRAMEDULLARY (IM) RETROGRADE FEMORAL NAILING;  Surgeon: Marybelle Killings, MD;  Location: [place];  Service: Orthopedics;  Laterality: Left;  . FOOT AMPUTATION THROUGH ANKLE Left 12/'21/2015   midfoot  . IR GENERIC HISTORICAL Left 05/01/2016   IR THROMBECTOMY AV FISTULA W/THROMBOLYSIS/PTA INC/SHUNT/IMG LEFT 05/01/2016 [name], MD [facility]  . IR GENERIC HISTORICAL  05/01/2016   IR US GUIDE VASC ACCESS LEFT 05/01/2016 [name], MD [facility]  . IR GENERIC HISTORICAL  05/07/2016   IR FLUORO GUIDE CV LINE RIGHT 05/07/2016 Corrie Mckusick, DO [facility]  . IR GENERIC HISTORICAL  05/07/2016   IR US GUIDE VASC ACCESS RIGHT 05/07/2016 Corrie Mckusick, DO [facility]  . IR GENERIC HISTORICAL  05/22/2016   IR US GUIDE VASC ACCESS RIGHT 05/22/2016 Greggory Keen, MD [facility]  . IR GENERIC HISTORICAL  05/22/2016   IR FLUORO GUIDE CV LINE RIGHT 05/22/2016 Greggory Keen, MD [facility]  . IR REMOVAL TUN CV CATH W/O FL  08/21/2016  . PERIPHERAL VASCULAR CATHETERIZATION Left 05/09/2016   Procedure: A/V Fistulagram;  Surgeon:  Angelia Mould, MD;  Location: [place] CV LAB;  Service: Cardiovascular;  Laterality: Left;  arm  . TEE WITHOUT CARDIOVERSION N/A 06/22/2018   Procedure: TRANSESOPHAGEAL ECHOCARDIOGRAM (TEE);  Surgeon: Jerline Pain, MD;  Location: [facility] ENDOSCOPY;  Service: Cardiovascular;  Laterality: N/A;    Family History  Problem Relation Age of Onset  . Diabetes Mother   . Diabetes Father     Social History   Socioeconomic History  . Marital status: Single    Spouse name: Not on file  .  Number of children: Not on file  . Years of education: Not on file  . Highest education level: Not on file  Occupational History  . Not on file  Tobacco Use  . Smoking status: Never Smoker  . Smokeless tobacco: Never Used  Substance and Sexual Activity  . Alcohol use: No  . Drug use: No  . Sexual activity: Never  Other Topics Concern  . Not on file  Social History Narrative   ** Merged History Encounter **       Social Determinants of Health   Financial Resource Strain:   . Difficulty of Paying Living Expenses:   Food Insecurity:   . Worried About Charity fundraiser in the Last Year:   . Arboriculturist in the Last Year:   Transportation Needs:   . Film/video editor (Medical):   Marland Kitchen Lack of Transportation (Non-Medical):   Physical Activity:   . Days of Exercise per Week:   . Minutes of Exercise per Session:   Stress:   . Feeling of Stress :   Social Connections:   . Frequency of Communication with Friends and Family:   . Frequency of Social Gatherings with Friends and Family:   . Attends Religious Services:   . Active Member of Clubs or Organizations:   . Attends Archivist Meetings:   Marland Kitchen Marital Status:   Intimate Partner Violence:   . Fear of Current or Ex-Partner:   . Emotionally Abused:   Marland Kitchen Physically Abused:   . Sexually Abused:     Outpatient Medications Prior to Visit  Medication Sig Dispense Refill  . acetaminophen (TYLENOL) 325 MG tablet Take 2 tablets (650 mg total) by mouth every 6 (six) hours as needed for mild pain.    . Amino Acids-Protein Hydrolys (FEEDING SUPPLEMENT, PRO-STAT SUGAR FREE 64,) LIQD Take 30 mLs by mouth 2 (two) times daily. (Patient not taking: Reported on 06/09/2019) 887 mL 0  . bictegravir-emtricitabine-tenofovir AF (BIKTARVY) 50-200-25 MG TABS tablet Take 1 tablet by mouth daily. 30 tablet 11  . Blood Glucose Monitoring Suppl (TRUE METRIX METER) DEVI 1 each by Does not apply route 3 (three) times daily. (Patient not  taking: Reported on 06/09/2019) 1 Device 0  . docusate sodium (COLACE) 100 MG capsule Take 1 capsule (100 mg total) by mouth 2 (two) times daily as needed for mild constipation. (Patient not taking: Reported on 06/09/2019) 10 capsule 0  . glucose blood (TRUE METRIX BLOOD GLUCOSE TEST) test strip 1 each by Other route 3 (three) times daily. 100 each 12  . insulin NPH-regular Human (70-30) 100 UNIT/ML injection Inject 15 Units into the skin 2 (two) times daily with a meal. 30 mL 6  . Insulin Syringe-Needle U-100 (BD INSULIN SYRINGE ULTRAFINE) 31G X 15/64" 0.5 ML MISC 1 each by Does not apply route 2 (two) times daily. Please dispense needles as well. 60 each 5  . midodrine (PROAMATINE) 10 MG tablet  Take 1 tablet (10 mg total) by mouth Every Tuesday,Thursday,and Saturday with dialysis. 15 tablet 0  . Nutritional Supplements (FEEDING SUPPLEMENT, NEPRO CARB STEADY,) LIQD Take 237 mLs by mouth 2 (two) times daily between meals. 60 Can 0  . ondansetron (ZOFRAN ODT) 4 MG disintegrating tablet Take 1 tablet (4 mg total) by mouth every 8 (eight) hours as needed for nausea or vomiting. 20 tablet 0  . TRUEplus Lancets 28G MISC 1 each by Does not apply route 3 (three) times daily. 100 each 11   No facility-administered medications prior to visit.    No Known Allergies  Review of Systems  Eyes: Positive for pain, discharge (watery discharge with dialysis) and itching.  Genitourinary:       Patient on dialysis  Musculoskeletal:       Bilateral LE BKAs       Objective:    Physical Exam HENT:     Head:           There were no vitals taken for this visit. Wt Readings from Last 3 Encounters:  06/09/19 137 lb (62.1 kg)  10/22/18 106 lb (48.1 kg)  10/19/18 106 lb (48.1 kg)    Health Maintenance Due  Topic Date Due  . PNEUMOCOCCAL POLYSACCHARIDE VACCINE AGE 36-64 HIGH RISK  Never done  . OPHTHALMOLOGY EXAM  Never done  . URINE MICROALBUMIN  Never done  . COVID-19 Vaccine (1) Never done  .  FOOT EXAM  12/12/2016  . HEMOGLOBIN A1C  03/31/2019    There are no preventive care reminders to display for this patient.   Lab Results  Component Value Date   TSH 2.021 10/25/2013   Lab Results  Component Value Date   WBC 6.1 06/21/2019   HGB 12.5 (L) 06/21/2019   HCT 36.7 (L) 06/21/2019   MCV 97.6 06/21/2019   PLT 198 06/21/2019   Lab Results  Component Value Date   NA 134 (L) 06/21/2019   K 4.9 06/21/2019   CO2 27 06/21/2019   GLUCOSE 110 (H) 06/21/2019   BUN 34 (H) 06/21/2019   CREATININE 7.22 (H) 06/21/2019   BILITOT 0.5 11/24/2018   ALKPHOS 117 11/24/2018   AST 35 11/24/2018   ALT 24 11/24/2018   PROT 9.0 (H) 11/24/2018   ALBUMIN 3.3 (L) 11/24/2018   CALCIUM 8.0 (L) 06/21/2019   ANIONGAP 13 06/21/2019   GFR 10.90 (LL) 07/10/2011   Lab Results  Component Value Date   CHOL 81 06/13/2014   Lab Results  Component Value Date   HDL 30 (L) 06/13/2014   Lab Results  Component Value Date   LDLCALC 38 06/13/2014   Lab Results  Component Value Date   TRIG 67 06/13/2014   Lab Results  Component Value Date   CHOLHDL 2.7 06/13/2014   Lab Results  Component Value Date   HGBA1C 6.8 09/28/2018       Assessment & Plan:   Problem List Items Addressed This Visit    None       No orders of the defined types were placed in this encounter.    Judeth Horn, MD

## 2019-09-21 NOTE — Progress Notes (Signed)
Here to check on his head rash on his head

## 2019-09-26 ENCOUNTER — Encounter (HOSPITAL_COMMUNITY): Payer: Self-pay | Admitting: Obstetrics and Gynecology

## 2019-09-26 ENCOUNTER — Emergency Department (HOSPITAL_COMMUNITY): Payer: Self-pay

## 2019-09-26 ENCOUNTER — Emergency Department (HOSPITAL_COMMUNITY)
Admission: EM | Admit: 2019-09-26 | Discharge: 2019-09-26 | Disposition: A | Discharge status: Home / Self Care | Payer: Self-pay | Attending: Emergency Medicine | Admitting: Emergency Medicine

## 2019-09-26 ENCOUNTER — Other Ambulatory Visit: Payer: Self-pay

## 2019-09-26 DIAGNOSIS — B20 Human immunodeficiency virus [HIV] disease: Secondary | ICD-10-CM | POA: Insufficient documentation

## 2019-09-26 DIAGNOSIS — Z79899 Other long term (current) drug therapy: Secondary | ICD-10-CM | POA: Insufficient documentation

## 2019-09-26 DIAGNOSIS — E1122 Type 2 diabetes mellitus with diabetic chronic kidney disease: Secondary | ICD-10-CM | POA: Insufficient documentation

## 2019-09-26 DIAGNOSIS — Z992 Dependence on renal dialysis: Secondary | ICD-10-CM | POA: Insufficient documentation

## 2019-09-26 DIAGNOSIS — R1032 Left lower quadrant pain: Secondary | ICD-10-CM | POA: Insufficient documentation

## 2019-09-26 DIAGNOSIS — R109 Unspecified abdominal pain: Secondary | ICD-10-CM

## 2019-09-26 DIAGNOSIS — Z794 Long term (current) use of insulin: Secondary | ICD-10-CM | POA: Insufficient documentation

## 2019-09-26 DIAGNOSIS — N186 End stage renal disease: Secondary | ICD-10-CM | POA: Insufficient documentation

## 2019-09-26 LAB — CBC WITH DIFFERENTIAL/PLATELET
Abs Immature Granulocytes: 0.03 10*3/uL (ref 0.00–0.07)
Basophils Absolute: 0 10*3/uL (ref 0.0–0.1)
Basophils Relative: 0 %
Eosinophils Absolute: 0.1 10*3/uL (ref 0.0–0.5)
Eosinophils Relative: 1 %
HCT: 35.2 % — ABNORMAL LOW (ref 39.0–52.0)
Hemoglobin: 11.6 g/dL — ABNORMAL LOW (ref 13.0–17.0)
Immature Granulocytes: 0 %
Lymphocytes Relative: 14 %
Lymphs Abs: 1.1 10*3/uL (ref 0.7–4.0)
MCH: 34.7 pg — ABNORMAL HIGH (ref 26.0–34.0)
MCHC: 33 g/dL (ref 30.0–36.0)
MCV: 105.4 fL — ABNORMAL HIGH (ref 80.0–100.0)
Monocytes Absolute: 0.9 10*3/uL (ref 0.1–1.0)
Monocytes Relative: 11 %
Neutro Abs: 5.6 10*3/uL (ref 1.7–7.7)
Neutrophils Relative %: 74 %
Platelets: 180 10*3/uL (ref 150–400)
RBC: 3.34 MIL/uL — ABNORMAL LOW (ref 4.22–5.81)
RDW: 14.4 % (ref 11.5–15.5)
WBC: 7.7 10*3/uL (ref 4.0–10.5)
nRBC: 0 % (ref 0.0–0.2)

## 2019-09-26 MED ORDER — HYDROMORPHONE HCL 1 MG/ML IJ SOLN
0.5000 mg | Freq: Once | INTRAMUSCULAR | Status: AC
  Administered 2019-09-26: 0.5 mg via INTRAVENOUS
  Filled 2019-09-26: qty 1

## 2019-09-26 MED ORDER — SODIUM CHLORIDE 0.9 % IV BOLUS
250.0000 mL | Freq: Once | INTRAVENOUS | Status: AC
  Administered 2019-09-26: 250 mL via INTRAVENOUS

## 2019-09-26 MED ORDER — DIAZEPAM 5 MG PO TABS
5.0000 mg | ORAL_TABLET | Freq: Once | ORAL | Status: AC
  Administered 2019-09-26: 5 mg via ORAL
  Filled 2019-09-26: qty 1

## 2019-09-26 MED ORDER — METAXALONE 800 MG PO TABS: 800.0000 mg | ORAL_TABLET | Freq: Three times a day (TID) | ORAL | 0 refills | Status: DC

## 2019-09-26 MED ORDER — OXYCODONE-ACETAMINOPHEN 5-325 MG PO TABS
1.0000 | ORAL_TABLET | Freq: Once | ORAL | Status: AC
  Administered 2019-09-26: 1 via ORAL
  Filled 2019-09-26: qty 1

## 2019-09-26 MED ORDER — HYDROCODONE-ACETAMINOPHEN 5-325 MG PO TABS: 1.0000 | ORAL_TABLET | ORAL | 0 refills | Status: DC | PRN

## 2019-09-26 NOTE — ED Triage Notes (Signed)
Patient reports he has been having left sided flank pain x3 days. Patient is a dialysis pt and last had dialysis on Friday. Patient denies chest pain or SOB. Endorses hx of diabetes.

## 2019-09-26 NOTE — ED Provider Notes (Signed)
Received the patient in signout from Dr. Zenia Resides.  Briefly the patient is a 49 year old male with a chief complaints of left flank pain.  CT scan performed with out a concerning cause.  However the patient became hypotensive here thought to be likely due to narcotic administration.  Plan for IV fluid bolus and observation for improvement.  Patient's blood pressure has improved as is been here.  No symptoms with this.  Is requesting discharge home at this time.   Deno Etienne, DO 09/26/19 2237

## 2019-09-26 NOTE — ED Notes (Signed)
Rainbow tubes sent to lab in save labels. Pink tube at bedside.

## 2019-09-26 NOTE — ED Notes (Signed)
Patient given crackers and water

## 2019-09-26 NOTE — ED Provider Notes (Addendum)
[facility] DEPT Provider Note   CSN: [id number] Arrival date & time: 09/26/19  1812     History Chief Complaint  Patient presents with  . Flank Pain  . Dialysis Patient    Alvin Daniels is a 49 y.o. male.  49 year old male with history of end-stage renal disease on hemodialysis along with AIDS who presents with sudden onset of left-sided flank pain which began yesterday.  Denies any fever or chills.  Does not make urine.  Pain is worse with certain positions.  No radicular symptoms.  Denies any rashes.  No prior history of same.        Past Medical History:  Diagnosis Date  . AIDS ([place]) 11/22/2014  . Back pain 06/09/2019  . BKA stump complication ([place]) 9/73/5329  . Chronic diarrhea   . Chronic hepatitis C without hepatic coma ([place]) 11/22/2014  . 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 Diabetic neuropathy ([place])   . ESRD (end stage renal disease) on dialysis [facility]    "TTS; don't remember street name" (05/03/2014)  . Hepatitis C   . HIV INFECTION 06/27/2010   Qualifier: Diagnosis of  By: Nickola Major CMA ( [place]), Geni Bers    . Hypotension 06/02/2012  . Metabolic bone disease 01/13/4267  . MRSA infection   . Normocytic anemia 06/17/2012  . Pancreatitis   . Pressure ulcer of BKA stump, stage 2 ([place]) 11/22/2014  . Renal disorder   . Severe protein-calorie malnutrition ([place]) 06/17/2012  . Uncontrolled diabetes mellitus with complications ([place]) 3/41/9622   Annotation: uncontrolled Qualifier: Diagnosis of  By: Nickola Major CMA Deborra Medina), Jacqueline      Patient Active Problem List   Diagnosis Date Noted  . Hx MRSA infection 09/21/2019  . BKA stump complication ([place]) 29/79/8921  . Back pain 06/09/2019  . Abscess of foot   . Abscess of ankle 08/29/2018  . Charcot foot due to diabetes mellitus ([place]) 07/23/2018  . MRSA bacteremia   . Diabetic foot ulcer ([place]) 11/19/2017  . Femur fracture, left ([place]) 08/16/2016  . HIV (human  immunodeficiency virus infection) ([place]) 08/16/2016  . GERD (gastroesophageal reflux disease) 05/24/2016  . Burn 04/10/2016  . Pressure ulcer of BKA stump, stage 2 ([place]) 11/22/2014  . Chronic hepatitis C without hepatic coma ([place]) 11/22/2014  . S/P bilateral BKA (below knee amputation) ([place]) 06/17/2014  . HIV disease ([place])   . Poor dentition 10/25/2013  . ESRD on dialysis ([place]) 10/25/2013  . Vision changes 06/21/2013  . Orthostatic headache 04/15/2013  . Anemia of chronic renal failure 06/19/2012  . Metabolic bone disease 19/41/7408  . Chronic diarrhea 06/17/2012  . Severe protein-calorie malnutrition ([place]) 06/17/2012  . Normocytic anemia 06/17/2012  . Polyneuropathy in diabetes(357.2) 06/27/2010  . Diabetes mellitus with ESRD (end-stage renal disease) ([place]) 01/03/2010    Past Surgical History:  Procedure Laterality Date  . AMPUTATION Left 04/20/2014   Procedure: 3rd toe amputation, 4th Toe Amputation,  5th Toe Amputation;  Surgeon: Newt Minion, MD;  Location: [place];  Service: Orthopedics;  Laterality: Left;  . AMPUTATION Left 05/02/2014   Procedure: Midfoot Amputation;  Surgeon: Newt Minion, MD;  Location: [place];  Service: Orthopedics;  Laterality: Left;  . AMPUTATION Left 06/17/2014   Procedure: AMPUTATION BELOW KNEE;  Surgeon: Newt Minion, MD;  Location: [place];  Service: Orthopedics;  Laterality: Left;  . AMPUTATION Right 09/04/2018   Procedure: RIGHT BELOW KNEE AMPUTATION;  Surgeon: Newt Minion, MD;  Location: [place];  Service: Orthopedics;  Laterality: Right;  RIGHT BELOW KNEE AMPUTATION  . AV FISTULA PLACEMENT Left   . AV FISTULA PLACEMENT Left 05/10/2016   Procedure: Creation Left Arm Brachiocephalic Arteriovenous Fistula and Ligation of Radiocephalic Fistula;  Surgeon: Angelia Mould, MD;  Location: [place];  Service: Vascular;  Laterality: Left;  . FEMUR IM NAIL Left 08/17/2016   Procedure: INTRAMEDULLARY (IM) RETROGRADE FEMORAL NAILING;  Surgeon: Marybelle Killings, MD;   Location: [place];  Service: Orthopedics;  Laterality: Left;  . FOOT AMPUTATION THROUGH ANKLE Left 12/'21/2015   midfoot  . IR GENERIC HISTORICAL Left 05/01/2016   IR THROMBECTOMY AV FISTULA W/THROMBOLYSIS/PTA INC/SHUNT/IMG LEFT 05/01/2016 [name], MD [facility]  . IR GENERIC HISTORICAL  05/01/2016   IR US GUIDE VASC ACCESS LEFT 05/01/2016 [name], MD [facility]  . IR GENERIC HISTORICAL  05/07/2016   IR FLUORO GUIDE CV LINE RIGHT 05/07/2016 Corrie Mckusick, DO [facility]  . IR GENERIC HISTORICAL  05/07/2016   IR US GUIDE VASC ACCESS RIGHT 05/07/2016 Corrie Mckusick, DO [facility]  . IR GENERIC HISTORICAL  05/22/2016   IR US GUIDE VASC ACCESS RIGHT 05/22/2016 Greggory Keen, MD [facility]  . IR GENERIC HISTORICAL  05/22/2016   IR FLUORO GUIDE CV LINE RIGHT 05/22/2016 Greggory Keen, MD [facility]  . IR REMOVAL TUN CV CATH W/O FL  08/21/2016  . PERIPHERAL VASCULAR CATHETERIZATION Left 05/09/2016   Procedure: A/V Fistulagram;  Surgeon: Angelia Mould, MD;  Location: [place] CV LAB;  Service: Cardiovascular;  Laterality: Left;  arm  . TEE WITHOUT CARDIOVERSION N/A 06/22/2018   Procedure: TRANSESOPHAGEAL ECHOCARDIOGRAM (TEE);  Surgeon: Jerline Pain, MD;  Location: [facility] ENDOSCOPY;  Service: Cardiovascular;  Laterality: N/A;       Family History  Problem Relation Age of Onset  . Diabetes Mother   . Diabetes Father     Social History   Tobacco Use  . Smoking status: Never Smoker  . Smokeless tobacco: Never Used  Substance Use Topics  . Alcohol use: No  . Drug use: No    Home Medications Prior to Admission medications   Medication Sig Start Date End Date Taking? Authorizing Provider  acetaminophen (TYLENOL) 325 MG tablet Take 2 tablets (650 mg total) by mouth every 6 (six) hours as needed for mild pain. 11/24/18   Carmin Muskrat, MD  Amino Acids-Protein Hydrolys (FEEDING SUPPLEMENT, PRO-STAT SUGAR FREE 64,) LIQD Take 30 mLs by mouth 2 (two) times  daily. Patient not taking: Reported on 06/09/2019 09/07/18   Lavina Hamman, MD  bictegravir-emtricitabine-tenofovir AF (BIKTARVY) 50-200-25 MG TABS tablet Take 1 tablet by mouth daily. 06/09/19   Truman Hayward, MD  Blood Glucose Monitoring Suppl (TRUE METRIX METER) DEVI 1 each by Does not apply route 3 (three) times daily. Patient not taking: Reported on 06/09/2019 09/15/18   [name], MD  docusate sodium (COLACE) 100 MG capsule Take 1 capsule (100 mg total) by mouth 2 (two) times daily as needed for mild constipation. Patient not taking: Reported on 06/09/2019 09/07/18   Lavina Hamman, MD  glucose blood (TRUE METRIX BLOOD GLUCOSE TEST) test strip 1 each by Other route 3 (three) times daily. 09/15/18   [name], MD  insulin NPH-regular Human (70-30) 100 UNIT/ML injection Inject 15 Units into the skin 2 (two) times daily with a meal. 09/28/18   [name], MD  Insulin Syringe-Needle U-100 (BD INSULIN SYRINGE ULTRAFINE) 31G X 15/64" 0.5 ML MISC 1 each by Does not  apply route 2 (two) times daily. Please dispense needles as well. 09/28/18   [name], MD  midodrine (PROAMATINE) 10 MG tablet Take 1 tablet (10 mg total) by mouth Every Tuesday,Thursday,and Saturday with dialysis. 09/08/18   Lavina Hamman, MD  Nutritional Supplements (FEEDING SUPPLEMENT, NEPRO CARB STEADY,) LIQD Take 237 mLs by mouth 2 (two) times daily between meals. 09/07/18   Lavina Hamman, MD  ondansetron (ZOFRAN ODT) 4 MG disintegrating tablet Take 1 tablet (4 mg total) by mouth every 8 (eight) hours as needed for nausea or vomiting. 06/21/19   Joy, Shawn C, PA-C  TRUEplus Lancets 28G MISC 1 each by Does not apply route 3 (three) times daily. 09/15/18   [name], MD    Allergies    Patient has no known allergies.  Review of Systems   Review of Systems  All other systems reviewed and are negative.   Physical Exam Updated Vital Signs BP 110/68   Pulse 90   Temp 98.2 F (36.8 C) (Oral)   Resp 15    SpO2 97%   Physical Exam Vitals and nursing note reviewed.  Constitutional:      General: He is not in acute distress.    Appearance: Normal appearance. He is well-developed. He is not toxic-appearing.  HENT:     Head: Normocephalic and atraumatic.  Eyes:     General: Lids are normal.     Conjunctiva/sclera: Conjunctivae normal.     Pupils: Pupils are equal, round, and reactive to light.  Neck:     Thyroid: No thyroid mass.     Trachea: No tracheal deviation.  Cardiovascular:     Rate and Rhythm: Normal rate and regular rhythm.     Heart sounds: Normal heart sounds. No murmur. No gallop.   Pulmonary:     Effort: Pulmonary effort is normal. No respiratory distress.     Breath sounds: Normal breath sounds. No stridor. No decreased breath sounds, wheezing, rhonchi or rales.  Abdominal:     General: Bowel sounds are normal. There is no distension.     Palpations: Abdomen is soft.     Tenderness: There is no abdominal tenderness. There is no rebound.  Musculoskeletal:        General: Normal range of motion.     Cervical back: Normal range of motion and neck supple.     Lumbar back: Tenderness present.       Back:  Skin:    General: Skin is warm and dry.     Findings: No abrasion or rash.  Neurological:     Mental Status: He is alert and oriented to person, place, and time.     GCS: GCS eye subscore is 4. GCS verbal subscore is 5. GCS motor subscore is 6.     Cranial Nerves: No cranial nerve deficit.     Sensory: No sensory deficit.  Psychiatric:        Speech: Speech normal.        Behavior: Behavior normal.     ED Results / Procedures / Treatments   Labs (all labs ordered are listed, but only abnormal results are displayed) Labs Reviewed  URINALYSIS, ROUTINE W REFLEX MICROSCOPIC    EKG None  Radiology No results found.  Procedures Procedures (including critical care time)  Medications Ordered in ED Medications  HYDROmorphone (DILAUDID) injection 0.5 mg  (has no administration in time range)    ED Course  I have reviewed the triage vital signs and the nursing  notes.  Pertinent labs & imaging results that were available during my care of the patient were reviewed by me and considered in my medical decision making (see chart for details).    MDM Rules/Calculators/A&P                      Patient medicated for pain with some improvement.  Renal CT does not show any intraureteral stones.  No hydronephrosis.  Does not make urine at this time.  Will discharge home  9:55 PM Prior to discharge patient's blood pressure noted and likely as a response to the medications he had received.  When he first presented he did have a slightly low blood pressure but that resolved without intervention.  Was given saline 250 cc of saline here.  He is due for dialysis tomorrow so will be very gentle with fluids.  We will continue to monitor here.  Patient denies being dizzy or lightheaded.  Have discussed with Dr. Tyrone Nine and will also sign out to APP [facility].   CRITICAL CARE Performed by: Leota Jacobsen Total critical care time: 45 minutes Critical care time was exclusive of separately billable procedures and treating other patients. Critical care was necessary to treat or prevent imminent or life-threatening deterioration. Critical care was time spent personally by me on the following activities: development of treatment plan with patient and/or surrogate as well as nursing, discussions with consultants, evaluation of patient's response to treatment, examination of patient, obtaining history from patient or surrogate, ordering and performing treatments and interventions, ordering and review of laboratory studies, ordering and review of radiographic studies, pulse oximetry and re-evaluation of patient's condition.  Final Clinical Impression(s) / ED Diagnoses Final diagnoses:  None    Rx / DC Orders ED Discharge Orders    None       Lacretia Leigh,  MD 09/26/19 2023    Lacretia Leigh, MD 09/26/19 2157

## 2019-09-26 NOTE — ED Notes (Signed)
Dr. Zenia Resides made aware of patient's low BP of 72/48.

## 2019-09-26 NOTE — Discharge Instructions (Addendum)
Return for worsening pain, fever 

## 2019-09-27 ENCOUNTER — Emergency Department (HOSPITAL_COMMUNITY): Payer: HRSA Program

## 2019-09-27 ENCOUNTER — Encounter (HOSPITAL_COMMUNITY): Payer: Self-pay

## 2019-09-27 ENCOUNTER — Inpatient Hospital Stay (HOSPITAL_COMMUNITY)
Admission: EM | Admit: 2019-09-27 | Discharge: 2019-10-02 | DRG: 974 | Disposition: A | Discharge status: Home Health | Payer: HRSA Program | Attending: Internal Medicine | Admitting: Internal Medicine

## 2019-09-27 DIAGNOSIS — L989 Disorder of the skin and subcutaneous tissue, unspecified: Secondary | ICD-10-CM

## 2019-09-27 DIAGNOSIS — Y9289 Other specified places as the place of occurrence of the external cause: Secondary | ICD-10-CM

## 2019-09-27 DIAGNOSIS — E1022 Type 1 diabetes mellitus with diabetic chronic kidney disease: Secondary | ICD-10-CM | POA: Diagnosis present

## 2019-09-27 DIAGNOSIS — U071 COVID-19: Secondary | ICD-10-CM | POA: Diagnosis present

## 2019-09-27 DIAGNOSIS — R109 Unspecified abdominal pain: Secondary | ICD-10-CM

## 2019-09-27 DIAGNOSIS — R0789 Other chest pain: Secondary | ICD-10-CM | POA: Diagnosis present

## 2019-09-27 DIAGNOSIS — Z89511 Acquired absence of right leg below knee: Secondary | ICD-10-CM | POA: Diagnosis not present

## 2019-09-27 DIAGNOSIS — E1040 Type 1 diabetes mellitus with diabetic neuropathy, unspecified: Secondary | ICD-10-CM | POA: Diagnosis present

## 2019-09-27 DIAGNOSIS — N186 End stage renal disease: Secondary | ICD-10-CM | POA: Diagnosis present

## 2019-09-27 DIAGNOSIS — B20 Human immunodeficiency virus [HIV] disease: Secondary | ICD-10-CM | POA: Diagnosis present

## 2019-09-27 DIAGNOSIS — R079 Chest pain, unspecified: Secondary | ICD-10-CM | POA: Diagnosis present

## 2019-09-27 DIAGNOSIS — Z79899 Other long term (current) drug therapy: Secondary | ICD-10-CM

## 2019-09-27 DIAGNOSIS — I953 Hypotension of hemodialysis: Secondary | ICD-10-CM

## 2019-09-27 DIAGNOSIS — Z794 Long term (current) use of insulin: Secondary | ICD-10-CM

## 2019-09-27 DIAGNOSIS — E1122 Type 2 diabetes mellitus with diabetic chronic kidney disease: Secondary | ICD-10-CM | POA: Diagnosis present

## 2019-09-27 DIAGNOSIS — Z89512 Acquired absence of left leg below knee: Secondary | ICD-10-CM | POA: Diagnosis not present

## 2019-09-27 DIAGNOSIS — W010XXA Fall on same level from slipping, tripping and stumbling without subsequent striking against object, initial encounter: Secondary | ICD-10-CM | POA: Diagnosis present

## 2019-09-27 DIAGNOSIS — Z8614 Personal history of Methicillin resistant Staphylococcus aureus infection: Secondary | ICD-10-CM | POA: Diagnosis not present

## 2019-09-27 DIAGNOSIS — D631 Anemia in chronic kidney disease: Secondary | ICD-10-CM | POA: Diagnosis present

## 2019-09-27 DIAGNOSIS — B182 Chronic viral hepatitis C: Secondary | ICD-10-CM | POA: Diagnosis present

## 2019-09-27 DIAGNOSIS — S42109A Fracture of unspecified part of scapula, unspecified shoulder, initial encounter for closed fracture: Secondary | ICD-10-CM | POA: Diagnosis present

## 2019-09-27 DIAGNOSIS — Z992 Dependence on renal dialysis: Secondary | ICD-10-CM

## 2019-09-27 DIAGNOSIS — Z21 Asymptomatic human immunodeficiency virus [HIV] infection status: Secondary | ICD-10-CM | POA: Diagnosis present

## 2019-09-27 DIAGNOSIS — I9589 Other hypotension: Secondary | ICD-10-CM | POA: Diagnosis present

## 2019-09-27 DIAGNOSIS — S42101A Fracture of unspecified part of scapula, right shoulder, initial encounter for closed fracture: Secondary | ICD-10-CM

## 2019-09-27 DIAGNOSIS — D6959 Other secondary thrombocytopenia: Secondary | ICD-10-CM | POA: Diagnosis present

## 2019-09-27 DIAGNOSIS — H5702 Anisocoria: Secondary | ICD-10-CM

## 2019-09-27 DIAGNOSIS — J1282 Pneumonia due to coronavirus disease 2019: Secondary | ICD-10-CM

## 2019-09-27 LAB — CBC
HCT: 31 % — ABNORMAL LOW (ref 39.0–52.0)
Hemoglobin: 10.3 g/dL — ABNORMAL LOW (ref 13.0–17.0)
MCH: 34.2 pg — ABNORMAL HIGH (ref 26.0–34.0)
MCHC: 33.2 g/dL (ref 30.0–36.0)
MCV: 103 fL — ABNORMAL HIGH (ref 80.0–100.0)
Platelets: 143 10*3/uL — ABNORMAL LOW (ref 150–400)
RBC: 3.01 MIL/uL — ABNORMAL LOW (ref 4.22–5.81)
RDW: 14 % (ref 11.5–15.5)
WBC: 5 10*3/uL (ref 4.0–10.5)
nRBC: 0 % (ref 0.0–0.2)

## 2019-09-27 LAB — TROPONIN I (HIGH SENSITIVITY): Troponin I (High Sensitivity): 17 ng/L (ref <18)

## 2019-09-27 LAB — BASIC METABOLIC PANEL
Anion gap: 18 — ABNORMAL HIGH (ref 5–15)
BUN: 22 mg/dL — ABNORMAL HIGH (ref 6–20)
CO2: 28 mmol/L (ref 22–32)
Calcium: 8.2 mg/dL — ABNORMAL LOW (ref 8.9–10.3)
Chloride: 95 mmol/L — ABNORMAL LOW (ref 98–111)
Creatinine, Ser: 7.16 mg/dL — ABNORMAL HIGH (ref 0.61–1.24)
GFR calc Af Amer: 10 mL/min — ABNORMAL LOW (ref >60)
GFR calc non Af Amer: 8 mL/min — ABNORMAL LOW (ref >60)
Glucose, Bld: 123 mg/dL — ABNORMAL HIGH (ref 70–99)
Potassium: 3.8 mmol/L (ref 3.5–5.1)
Sodium: 141 mmol/L (ref 135–145)

## 2019-09-27 LAB — PROTIME-INR
INR: 1.1 (ref 0.8–1.2)
Prothrombin Time: 13.5 seconds (ref 11.4–15.2)

## 2019-09-27 LAB — LACTIC ACID, PLASMA: Lactic Acid, Venous: 1.6 mmol/L (ref 0.5–1.9)

## 2019-09-27 LAB — SARS CORONAVIRUS 2 BY RT PCR (HOSPITAL ORDER, PERFORMED IN ~~LOC~~ HOSPITAL LAB): SARS Coronavirus 2: POSITIVE — AB

## 2019-09-27 LAB — APTT: aPTT: 42 seconds — ABNORMAL HIGH (ref 24–36)

## 2019-09-27 MED ORDER — MORPHINE SULFATE (PF) 4 MG/ML IV SOLN
4.0000 mg | Freq: Once | INTRAVENOUS | Status: AC
  Administered 2019-09-27: 4 mg via INTRAVENOUS
  Filled 2019-09-27: qty 1

## 2019-09-27 MED ORDER — SODIUM CHLORIDE 0.9 % IV SOLN
500.0000 mg | INTRAVENOUS | Status: DC
  Administered 2019-09-28: 500 mg via INTRAVENOUS
  Filled 2019-09-27 (×2): qty 500

## 2019-09-27 MED ORDER — SODIUM CHLORIDE 0.9 % IV SOLN
1.0000 g | INTRAVENOUS | Status: DC
  Administered 2019-09-28: 1 g via INTRAVENOUS
  Filled 2019-09-27: qty 10

## 2019-09-27 MED ORDER — IOHEXOL 350 MG/ML SOLN
80.0000 mL | Freq: Once | INTRAVENOUS | Status: AC | PRN
  Administered 2019-09-27: 80 mL via INTRAVENOUS

## 2019-09-27 MED ORDER — TETRACAINE HCL 0.5 % OP SOLN
2.0000 [drp] | Freq: Once | OPHTHALMIC | Status: AC
  Administered 2019-09-28: 2 [drp] via OPHTHALMIC
  Filled 2019-09-27: qty 4

## 2019-09-27 MED ORDER — SODIUM CHLORIDE 0.9 % IV BOLUS
500.0000 mL | Freq: Once | INTRAVENOUS | Status: AC
  Administered 2019-09-28: 500 mL via INTRAVENOUS

## 2019-09-27 MED ORDER — ONDANSETRON HCL 4 MG/2ML IJ SOLN
4.0000 mg | Freq: Once | INTRAMUSCULAR | Status: AC
  Administered 2019-09-27: 4 mg via INTRAVENOUS
  Filled 2019-09-27: qty 2

## 2019-09-27 MED ORDER — SODIUM CHLORIDE 0.9 % IV BOLUS
500.0000 mL | Freq: Once | INTRAVENOUS | Status: AC
  Administered 2019-09-27: 500 mL via INTRAVENOUS

## 2019-09-27 NOTE — Progress Notes (Signed)
Called by ER provider, Dr. Lajean Saver, regarding right scapular fracture.  Apparently the patient is not the best historian, and is not exactly clear when or how he broke his scapula, but does report that he had a massage which made it feel better.  His CAT scan demonstrates a comminuted but minimally displaced scapular fracture.  Recommend sling management, nonweightbearing right upper extremity, okay to use fingers wrist and hand, follow-up with me in the office in approximately 2 weeks, okay to visit by telehealth if he is still Covid positive under quarantine at that point.  Johnny Bridge, MD

## 2019-09-27 NOTE — ED Notes (Addendum)
Called the translator to ask pt to read visual acuity board pt said he could not see any letters good enough to read.

## 2019-09-27 NOTE — ED Provider Notes (Addendum)
[facility]ERGENCY DEPARTMENT Provider Note   CSN: [id number] Arrival date & time: 09/27/19  1532     History Chief Complaint  Patient presents with  . Chest Pain  . Back Pain    Alvin Daniels is a 49 y.o. male.  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 Symptoms acute onset, dull, moderate, constant, persistent, non radiating.  Pt feels pain in both chest and back. Denies hx same pain. Denies trauma, strain or fall. No palpitations. No syncope. Symptoms at rest, no relation to position, activity or exertion. Not pleuritic. No leg swelling, remote hx bil amputations. No hx dvt or pe. Denies hx cad. Occasional non prod cough. No sore throat or runny nose. No known covid exposure. Denies fever or chills. Pt also c/o sore spot, which he calls 'pimple' to anterior scalp for past 1-2 months - no recent change - states since there it causes a sharp pain to shoot from there down to left eye. No acute, abrupt or recent worsening of these symptoms. States during that 1-2 months vision has seemed blurry. Denies eye pain/redness. Denies prior problems with same eye. Interpreter line used.   The history is provided by the patient. A language interpreter was used.  Chest Pain Associated symptoms: back pain and cough   Associated symptoms: no abdominal pain, no fever, no nausea, no numbness, no palpitations, no shortness of breath and no vomiting   Back Pain Associated symptoms: chest pain   Associated symptoms: no abdominal pain, no fever and no numbness        Past Medical History:  Diagnosis Date  . AIDS ([place]) 11/22/2014  . Back pain 06/09/2019  . BKA stump complication ([place]) 2/84/1324  . Chronic diarrhea   . Chronic hepatitis C without hepatic coma ([place]) 11/22/2014  . 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 Diabetic neuropathy ([place])   . ESRD (end stage renal disease) on dialysis [facility])    "TTS; don't remember street name" (05/03/2014)  . Hepatitis C   . HIV INFECTION 06/27/2010   Qualifier: Diagnosis of  By: Nickola Major CMA ( [place]), Geni Bers    . Hypotension 06/02/2012  . Metabolic bone disease 4/0/1027  . MRSA infection   . Normocytic anemia 06/17/2012  . Pancreatitis   . Pressure ulcer of BKA stump, stage 2 ([place]) 11/22/2014  . Renal disorder   . Severe protein-calorie malnutrition ([place]) 06/17/2012  . Uncontrolled diabetes mellitus with complications ([place]) 2/53/6644   Annotation: uncontrolled Qualifier: Diagnosis of  By: Nickola Major CMA Deborra Medina), Jacqueline      Patient Active Problem List   Diagnosis Date Noted  . Hx MRSA infection 09/21/2019  . BKA stump complication ([place]) 03/47/4259  . Back pain 06/09/2019  . Abscess of foot   . Abscess of ankle 08/29/2018  . Charcot foot due to diabetes mellitus ([place]) 07/23/2018  . MRSA bacteremia   . Diabetic foot ulcer ([place]) 11/19/2017  . Femur fracture, left ([place]) 08/16/2016  . HIV (human immunodeficiency virus infection) ([place]) 08/16/2016  . GERD (gastroesophageal reflux disease) 05/24/2016  . Burn 04/10/2016  . Pressure ulcer of BKA stump, stage 2 ([place]) 11/22/2014  . Chronic hepatitis C without hepatic coma ([place]) 11/22/2014  . S/P bilateral BKA (below knee amputation) ([place]) 06/17/2014  . HIV disease ([place])   . Poor dentition 10/25/2013  . ESRD on dialysis ([place]) 10/25/2013  . Vision changes 06/21/2013  .  Orthostatic headache 04/15/2013  . Anemia of chronic renal failure 06/19/2012  . Metabolic bone disease 19/37/9024  . Chronic diarrhea 06/17/2012  . Severe protein-calorie malnutrition ([place]) 06/17/2012  . Normocytic anemia 06/17/2012  . Polyneuropathy in diabetes(357.2) 06/27/2010  . Diabetes mellitus with ESRD (end-stage renal disease) ([place]) 01/03/2010    Past Surgical History:  Procedure Laterality Date  . AMPUTATION Left 04/20/2014   Procedure: 3rd toe amputation, 4th Toe Amputation,  5th Toe Amputation;  Surgeon:  Newt Minion, MD;  Location: [facility];  Service: Orthopedics;  Laterality: Left;  . AMPUTATION Left 05/02/2014   Procedure: Midfoot Amputation;  Surgeon: Newt Minion, MD;  Location: [facility];  Service: Orthopedics;  Laterality: Left;  . AMPUTATION Left 06/17/2014   Procedure: AMPUTATION BELOW KNEE;  Surgeon: Newt Minion, MD;  Location: [facility];  Service: Orthopedics;  Laterality: Left;  . AMPUTATION Right 09/04/2018   Procedure: RIGHT BELOW KNEE AMPUTATION;  Surgeon: Newt Minion, MD;  Location: [place];  Service: Orthopedics;  Laterality: Right;  RIGHT BELOW KNEE AMPUTATION  . AV FISTULA PLACEMENT Left   . AV FISTULA PLACEMENT Left 05/10/2016   Procedure: Creation Left Arm Brachiocephalic Arteriovenous Fistula and Ligation of Radiocephalic Fistula;  Surgeon: Angelia Mould, MD;  Location: [facility];  Service: Vascular;  Laterality: Left;  . FEMUR IM NAIL Left 08/17/2016   Procedure: INTRAMEDULLARY (IM) RETROGRADE FEMORAL NAILING;  Surgeon: Marybelle Killings, MD;  Location: [place] [facility];  Service: Orthopedics;  Laterality: Left;  . FOOT AMPUTATION THROUGH ANKLE Left 12/'21/2015   midfoot  . IR GENERIC HISTORICAL Left 05/01/2016   IR THROMBECTOMY AV FISTULA W/THROMBOLYSIS/PTA INC/SHUNT/IMG LEFT 05/01/2016 [name], MD [facility]  . IR GENERIC HISTORICAL  05/01/2016   IR US GUIDE VASC ACCESS LEFT 05/01/2016 [name], MD [facility]  . IR GENERIC HISTORICAL  05/07/2016   IR FLUORO GUIDE CV LINE RIGHT 05/07/2016 Corrie Mckusick, DO [facility]  . IR GENERIC HISTORICAL  05/07/2016   IR US GUIDE VASC ACCESS RIGHT 05/07/2016 Corrie Mckusick, DO [facility]  . IR GENERIC HISTORICAL  05/22/2016   IR US GUIDE VASC ACCESS RIGHT 05/22/2016 Greggory Keen, MD [facility]  . IR GENERIC HISTORICAL  05/22/2016   IR FLUORO GUIDE CV LINE RIGHT 05/22/2016 Greggory Keen, MD [facility]  . IR REMOVAL TUN CV CATH W/O FL  08/21/2016  . PERIPHERAL VASCULAR CATHETERIZATION Left 05/09/2016   Procedure: A/V  Fistulagram;  Surgeon: Angelia Mould, MD;  Location: [facility];  Service: Cardiovascular;  Laterality: Left;  arm  . TEE WITHOUT CARDIOVERSION N/A 06/22/2018   Procedure: TRANSESOPHAGEAL ECHOCARDIOGRAM (TEE);  Surgeon: Jerline Pain, MD;  Location: [facility];  Service: Cardiovascular;  Laterality: N/A;       Family History  Problem Relation Age of Onset  . Diabetes Mother   . Diabetes Father     Social History   Tobacco Use  . Smoking status: Never Smoker  . Smokeless tobacco: Never Used  Substance Use Topics  . Alcohol use: No  . Drug use: No    Home Medications Prior to Admission medications   Medication Sig Start Date End Date Taking? Authorizing Provider  acetaminophen (TYLENOL) 325 MG tablet Take 2 tablets (650 mg total) by mouth every 6 (six) hours as needed for mild pain. Patient not taking: Reported on 09/26/2019 11/24/18   Carmin Muskrat, MD  Amino Acids-Protein Hydrolys (FEEDING SUPPLEMENT, PRO-STAT SUGAR FREE 64,) LIQD Take 30 mLs by mouth 2 (two)  times daily. Patient not taking: Reported on 06/09/2019 09/07/18   Lavina Hamman, MD  bictegravir-emtricitabine-tenofovir AF (BIKTARVY) 50-200-25 MG TABS tablet Take 1 tablet by mouth daily. 06/09/19   Truman Hayward, MD  Blood Glucose Monitoring Suppl (TRUE METRIX METER) DEVI 1 each by Does not apply route 3 (three) times daily. Patient not taking: Reported on 06/09/2019 09/15/18   [name], MD  docusate sodium (COLACE) 100 MG capsule Take 1 capsule (100 mg total) by mouth 2 (two) times daily as needed for mild constipation. Patient not taking: Reported on 06/09/2019 09/07/18   Lavina Hamman, MD  glucose blood (TRUE METRIX BLOOD GLUCOSE TEST) test strip 1 each by Other route 3 (three) times daily. 09/15/18   [name], MD  HYDROcodone-acetaminophen (NORCO/VICODIN) 5-325 MG tablet Take 1-2 tablets by mouth every 4 (four) hours as needed. 09/26/19   Lacretia Leigh, MD  insulin NPH-regular Human  (70-30) 100 UNIT/ML injection Inject 15 Units into the skin 2 (two) times daily with a meal. 09/28/18   [name], MD  Insulin Syringe-Needle U-100 (BD INSULIN SYRINGE ULTRAFINE) 31G X 15/64" 0.5 ML MISC 1 each by Does not apply route 2 (two) times daily. Please dispense needles as well. 09/28/18   [name], MD  metaxalone (SKELAXIN) 800 MG tablet Take 1 tablet (800 mg total) by mouth 3 (three) times daily. 09/26/19   Lacretia Leigh, MD  midodrine (PROAMATINE) 10 MG tablet Take 1 tablet (10 mg total) by mouth Every Tuesday,Thursday,and Saturday with dialysis. 09/08/18   Lavina Hamman, MD  Nutritional Supplements (FEEDING SUPPLEMENT, NEPRO CARB STEADY,) LIQD Take 237 mLs by mouth 2 (two) times daily between meals. Patient not taking: Reported on 09/26/2019 09/07/18   Lavina Hamman, MD  ondansetron (ZOFRAN ODT) 4 MG disintegrating tablet Take 1 tablet (4 mg total) by mouth every 8 (eight) hours as needed for nausea or vomiting. 06/21/19   Joy, Shawn C, PA-C  TRUEplus Lancets 28G MISC 1 each by Does not apply route 3 (three) times daily. 09/15/18   [name], MD    Allergies    Patient has no known allergies.  Review of Systems   Review of Systems  Constitutional: Negative for chills and fever.  HENT: Negative for sore throat.   Eyes: Negative for pain, discharge and redness.  Respiratory: Positive for cough. Negative for shortness of breath.   Cardiovascular: Positive for chest pain. Negative for palpitations and leg swelling.  Gastrointestinal: Negative for abdominal pain, nausea and vomiting.  Genitourinary: Negative for flank pain.  Musculoskeletal: Positive for back pain. Negative for neck pain.  Skin:       +scalp lesion - see hpi.   Neurological: Negative for speech difficulty and numbness.  Hematological: Does not bruise/bleed easily.  Psychiatric/Behavioral: Negative for confusion.    Physical Exam Updated Vital Signs BP (!) 85/52 (BP Location: Right Arm)   Pulse  94   Temp 98.3 F (36.8 C) (Oral)   Resp 13   SpO2 94%   Physical Exam Vitals and nursing note reviewed.  Constitutional:      Appearance: Normal appearance. He is well-developed.  HENT:     Head: Atraumatic.     Nose: Nose normal.     Mouth/Throat:     Mouth: Mucous membranes are moist.     Pharynx: Oropharynx is clear.  Eyes:     General: No scleral icterus.    Conjunctiva/sclera: Conjunctivae normal.     Comments: Left pupil 4-5 mm,  not reactive. Right 3 mm reactive. Eomi. Conj normal. Normal eom without pain. Patient able to count fingers with each eye.  IOP checked left eye w tonopen, reading of 22, then 19.   Neck:     Vascular: No carotid bruit.     Trachea: No tracheal deviation.     Comments: No stiffness or rigidity.  Cardiovascular:     Rate and Rhythm: Normal rate and regular rhythm.     Pulses: Normal pulses.     Heart sounds: Normal heart sounds. No murmur. No friction rub. No gallop.   Pulmonary:     Effort: Pulmonary effort is normal. No accessory muscle usage or respiratory distress.     Breath sounds: Normal breath sounds.     Comments: Upper chest tenderness. Normal chest wall movement. No crepitus.  Chest:     Chest wall: Tenderness present.  Abdominal:     General: Bowel sounds are normal. There is no distension.     Palpations: Abdomen is soft.     Tenderness: There is no abdominal tenderness. There is no guarding.  Genitourinary:    Comments: No cva tenderness. Musculoskeletal:        General: No swelling or tenderness.     Cervical back: Normal range of motion and neck supple. No rigidity.     Comments: bil amputee. Stumps without sign of infection. CTLS spine, non tender, aligned, no step off. Tenderness across bil trapezius muscle, and bil scapula.   Lymphadenopathy:     Cervical: No cervical adenopathy.  Skin:    General: Skin is warm and dry.     Findings: No rash.     Comments: Approximately 8 mm diameter, scaly/scabbed anterior scalp  lesion on left. No purulent drainage. No vesicles. No cellulitis.   Neurological:     Mental Status: He is alert.     Comments: Alert, speech clear. Motor fxn intact bil, stre 5/5. sens grossly intact bil  Psychiatric:        Mood and Affect: Mood normal.           ED Results / Procedures / Treatments   Labs (all labs ordered are listed, but only abnormal results are displayed) Results for orders placed or performed during the hospital encounter of 09/27/19  SARS Coronavirus 2 by RT PCR (hospital order, performed in [facility] hospital lab) Nasopharyngeal Nasopharyngeal Swab   Specimen: Nasopharyngeal Swab  Result Value Ref Range   SARS Coronavirus 2 POSITIVE (A) NEGATIVE  Basic metabolic panel  Result Value Ref Range   Sodium 141 135 - 145 mmol/L   Potassium 3.8 3.5 - 5.1 mmol/L   Chloride 95 (L) 98 - 111 mmol/L   CO2 28 22 - 32 mmol/L   Glucose, Bld 123 (H) 70 - 99 mg/dL   BUN 22 (H) 6 - 20 mg/dL   Creatinine, Ser 7.16 (H) 0.61 - 1.24 mg/dL   Calcium 8.2 (L) 8.9 - 10.3 mg/dL   GFR calc non Af Amer 8 (L) >60 mL/min   GFR calc Af Amer 10 (L) >60 mL/min   Anion gap 18 (H) 5 - 15  CBC  Result Value Ref Range   WBC 5.0 4.0 - 10.5 K/uL   RBC 3.01 (L) 4.22 - 5.81 MIL/uL   Hemoglobin 10.3 (L) 13.0 - 17.0 g/dL   HCT 31.0 (L) 39.0 - 52.0 %   MCV 103.0 (H) 80.0 - 100.0 fL   MCH 34.2 (H) 26.0 - 34.0 pg   MCHC 33.2  30.0 - 36.0 g/dL   RDW 14.0 11.5 - 15.5 %   Platelets 143 (L) 150 - 400 K/uL   nRBC 0.0 0.0 - 0.2 %  Lactic acid, plasma  Result Value Ref Range   Lactic Acid, Venous 1.6 0.5 - 1.9 mmol/L  APTT  Result Value Ref Range   aPTT 42 (H) 24 - 36 seconds  Protime-INR  Result Value Ref Range   Prothrombin Time 13.5 11.4 - 15.2 seconds   INR 1.1 0.8 - 1.2  Troponin I (High Sensitivity)  Result Value Ref Range   Troponin I (High Sensitivity) 17 <18 ng/L   DG Chest Portable 1 View  Result Date: 09/27/2019 CLINICAL DATA:  Chest pain EXAM: PORTABLE CHEST 1 VIEW  COMPARISON:  August 27, 2018 FINDINGS: The heart size and mediastinal contours are within normal limits. No new consolidation or edema. No pleural effusion or pneumothorax. No new osseous abnormality. IMPRESSION: No acute process in the chest. Electronically Signed   By: Macy Mis M.D.   On: 09/27/2019 16:26   CT Renal Stone Study  Result Date: 09/26/2019 CLINICAL DATA:  Left flank pain for 3 days, on dialysis EXAM: CT ABDOMEN AND PELVIS WITHOUT CONTRAST TECHNIQUE: Multidetector CT imaging of the abdomen and pelvis was performed following the standard protocol without IV contrast. COMPARISON:  CT 04/01/2016 FINDINGS: Lower chest: Atelectatic changes present in the lung bases. Normal heart size. No pericardial effusion. Coronary artery calcifications are present. Hepatobiliary: No focal liver abnormality is seen within the limitations of this unenhanced CT. No gallstones, gallbladder wall thickening, or biliary dilatation. Pancreas: Unremarkable. No pancreatic ductal dilatation or surrounding inflammatory changes. Spleen: Normal in size without focal abnormality. Adrenals/Urinary Tract: Normal adrenal glands. Redemonstration of extensive calcification near the corticomedullary junctions which could reflect some medullary nephrocalcinosis versus urolithiasis. No frank hydronephrosis is seen however there are several hyperattenuating foci along the course of the distal ureter, larger distal calcification measuring up to 3 (5/83, 2/67). Thinner more elongated 2 mm calculus is seen just proximal (2/64, 5/85). No right ureteral calculi. Urinary bladder is largely decompressed at the time of exam and therefore poorly evaluated by CT imaging. No gross bladder abnormality. Stomach/Bowel: Distal esophagus, stomach and duodenal sweep are unremarkable. No small bowel wall thickening or dilatation. No evidence of obstruction. A normal appendix is visualized. Much of the colon is fluid-filled without edematous mural  thickening or distension. Vascular/Lymphatic: Atherosclerotic calcifications throughout the abdominal aorta and branch vessels. No aneurysm or ectasia. No enlarged abdominopelvic lymph nodes. Reproductive: The prostate and seminal vesicles are unremarkable. Other: No abdominopelvic free fluid or free gas. No bowel containing hernias. Musculoskeletal: Some focal increased attenuate about the vertebral body endplates may reflect early changes of renal osteodystrophy. No acute osseous abnormality or suspicious osseous lesion. IMPRESSION: 1. There at least 2 small calculi seen within the distal left ureter measuring up to 3 mm with only mild ureterectasis but no frank hydronephrosis. 2. More diffuse calcification about the corticomedullary junctions could reflect additional nonobstructing nephrolithiasis or medullary nephrocalcinosis. 3. Much of the colon is fluid-filled without edematous mural thickening or distension. Findings are nonspecific but can be seen with diarrheal illness. Correlate with patient's symptoms 4. Coronary artery calcifications. 5. Aortic Atherosclerosis (ICD10-I70.0). 6. 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 Electronically Signed   By: Lovena Le M.D.   On: 09/26/2019 19:53    EKG EKG Interpretation  Date/Time:  Monday Sep 27 2019 15:52:40 EDT Ventricular Rate:  91 PR Interval:  128 QRS Duration:  78 QT Interval:  358 QTC Calculation: 440 R Axis:   84 Text Interpretation: Normal sinus rhythm Nonspecific ST abnormality Confirmed by Lajean Saver [id number]) on 09/27/2019 4:22:59 PM   Radiology CT HEAD WO CONTRAST  Result Date: 09/27/2019 CLINICAL DATA:  Headache EXAM: CT HEAD WITHOUT CONTRAST TECHNIQUE: Contiguous axial images were obtained from the base of the skull through the vertex without intravenous contrast. COMPARISON:  CT brain 06/21/2019 FINDINGS: Brain: No evidence of acute infarction, hemorrhage, hydrocephalus, extra-axial collection or mass lesion/mass effect.  Vascular: No hyperdense vessel or unexpected calcification. Skull: Normal. Negative for fracture or focal lesion. Sinuses/Orbits: Mucosal thickening in the ethmoid sinuses. Other: None. IMPRESSION: Negative non contrasted CT appearance of the brain.  Sinus disease. Electronically Signed   By: Donavan Foil M.D.   On: 09/27/2019 19:17   DG Chest Portable 1 View  Result Date: 09/27/2019 CLINICAL DATA:  Chest pain EXAM: PORTABLE CHEST 1 VIEW COMPARISON:  August 27, 2018 FINDINGS: The heart size and mediastinal contours are within normal limits. No new consolidation or edema. No pleural effusion or pneumothorax. No new osseous abnormality. IMPRESSION: No acute process in the chest. Electronically Signed   By: Macy Mis M.D.   On: 09/27/2019 16:26   CT Renal Stone Study  Result Date: 09/26/2019 CLINICAL DATA:  Left flank pain for 3 days, on dialysis EXAM: CT ABDOMEN AND PELVIS WITHOUT CONTRAST TECHNIQUE: Multidetector CT imaging of the abdomen and pelvis was performed following the standard protocol without IV contrast. COMPARISON:  CT 04/01/2016 FINDINGS: Lower chest: Atelectatic changes present in the lung bases. Normal heart size. No pericardial effusion. Coronary artery calcifications are present. Hepatobiliary: No focal liver abnormality is seen within the limitations of this unenhanced CT. No gallstones, gallbladder wall thickening, or biliary dilatation. Pancreas: Unremarkable. No pancreatic ductal dilatation or surrounding inflammatory changes. Spleen: Normal in size without focal abnormality. Adrenals/Urinary Tract: Normal adrenal glands. Redemonstration of extensive calcification near the corticomedullary junctions which could reflect some medullary nephrocalcinosis versus urolithiasis. No frank hydronephrosis is seen however there are several hyperattenuating foci along the course of the distal ureter, larger distal calcification measuring up to 3 (5/83, 2/67). Thinner more elongated 2 mm calculus  is seen just proximal (2/64, 5/85). No right ureteral calculi. Urinary bladder is largely decompressed at the time of exam and therefore poorly evaluated by CT imaging. No gross bladder abnormality. Stomach/Bowel: Distal esophagus, stomach and duodenal sweep are unremarkable. No small bowel wall thickening or dilatation. No evidence of obstruction. A normal appendix is visualized. Much of the colon is fluid-filled without edematous mural thickening or distension. Vascular/Lymphatic: Atherosclerotic calcifications throughout the abdominal aorta and branch vessels. No aneurysm or ectasia. No enlarged abdominopelvic lymph nodes. Reproductive: The prostate and seminal vesicles are unremarkable. Other: No abdominopelvic free fluid or free gas. No bowel containing hernias. Musculoskeletal: Some focal increased attenuate about the vertebral body endplates may reflect early changes of renal osteodystrophy. No acute osseous abnormality or suspicious osseous lesion. IMPRESSION: 1. There at least 2 small calculi seen within the distal left ureter measuring up to 3 mm with only mild ureterectasis but no frank hydronephrosis. 2. More diffuse calcification about the corticomedullary junctions could reflect additional nonobstructing nephrolithiasis or medullary nephrocalcinosis. 3. Much of the colon is fluid-filled without edematous mural thickening or distension. Findings are nonspecific but can be seen with diarrheal illness. Correlate with patient's symptoms 4. Coronary artery calcifications. 5. Aortic Atherosclerosis (ICD10-I70.0). 6. 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 Electronically Signed   By: March Rummage  [facility] M.D.   On: 09/26/2019 19:53   CT Angio Chest/Abd/Pel for Dissection W and/or W/WO  Result Date: 09/27/2019 CLINICAL DATA:  Left flank pain, hypotensive EXAM: CT ANGIOGRAPHY CHEST, ABDOMEN AND PELVIS TECHNIQUE: Non-contrast CT of the chest was initially obtained. Multidetector CT imaging through the chest,  abdomen and pelvis was performed using the standard protocol during bolus administration of intravenous contrast. Multiplanar reconstructed images and MIPs were obtained and reviewed to evaluate the vascular anatomy. CONTRAST:  59mL OMNIPAQUE IOHEXOL 350 MG/ML SOLN COMPARISON:  CT renal colic 29/92/4268 FINDINGS: CTA CHEST FINDINGS Cardiovascular: Noncontrast CT of the chest reveals no abnormal hyperdense mural thickening of the thoracic aorta to suggest intramural hematoma. Postcontrast administration there is satisfactory arterial phase enhancement. The aortic root and ascending aorta is suboptimally assessed given cardiac pulsation artifact. The aorta is normal caliber. No convincing features of dissection flap, minimal aortic injury, or other acute luminal abnormality of the aorta is seen. No periaortic stranding or hemorrhage. Minimal plaque seen within the aortic arch. The left vertebral artery arises directly from the aortic arch. Remaining proximal great vessels are unremarkable aside from minimal calcific plaque in the right subclavian artery. Central pulmonary arteries are normal caliber. Satisfactory pulmonary arterial opacification no central, lobar or segmental filling defects are identified. Normal heart size. No pericardial effusion. Mediastinum/Nodes: Calcified lymph nodes are present in the right hilar region. May reflect sequela of remote granulomatous disease. No worrisome mediastinal, hilar or axillary adenopathy. No mediastinal fluid or gas. Normal thyroid gland and thoracic inlet. No acute abnormality of the trachea or esophagus. Lungs/Pleura: Scattered multifocal areas of mixed ground-glass and consolidation throughout the lungs with a basilar and peripheral predominance on a background of more dependent atelectatic change. No pneumothorax or effusion. No convincing features of edema. No conspicuous pulmonary nodules or masses. Musculoskeletal: There is a highly comminuted fracture of the  right scapula with separate fracture lines extending throughout the supraspinous and infraspinous fossa and with extension through the medial and lateral borders as well as into the scapular spine near the base of the coracoid which is incompletely imaged on this exam. There is extensive overlying contusive change of the musculature of the posterior shoulder. There is a remote posttraumatic deformity of the distal left clavicle. No other acute or suspicious osseous lesions. Review of the MIP images confirms the above findings. CTA ABDOMEN AND PELVIS FINDINGS VASCULAR Aorta: Atherosclerotic plaque within the normal caliber aorta. No significant stenosis. No evidence of aneurysm, dissection or vasculitis. Celiac: Widely patent ostium. Calcified and noncalcified plaque within the splenic artery with minimal multifocal plaque narrowing but no other significant stenosis. No evidence of aneurysm, dissection or vasculitis. SMA: Normally opacified without evidence of aneurysm, dissection, vasculitis or significant stenosis. Renals: Paired renal arteries bilaterally. Renal arteries are patent without evidence of aneurysm, dissection, vasculitis or fibromuscular dysplasia. IMA: Mild narrowing at the IMA ostium. More distal vessel is normally opacified without significant stenosis. No evidence of aneurysm, dissection or vasculitis. Inflow: Plaque seen within the right common and internal iliac artery without significant flow-limiting stenosis. No evidence of aneurysm, dissection or vasculitis. Plaque seen throughout the proximal outflow vessels as well without significant stenosis. Veins: No obvious venous abnormality within the limitations of this arterial phase study. Review of the MIP images confirms the above findings. NON-VASCULAR Hepatobiliary: No focal liver abnormality is seen. No visible gallstones or gallbladder wall thickening. No pericholecystic fluid. Mild extrahepatic biliary ductal dilatation with the common  bile duct measuring up to 9 mm. No  visible intraductal gallstones. Pancreas: Unremarkable. No pancreatic ductal dilatation or surrounding inflammatory changes. Spleen: Normal in size without focal abnormality. Adrenals/Urinary Tract: Normal adrenal glands. Redemonstration of the calcification in the corticomedullary junctions, possibly involving the papilla. Few do appear to reflect collecting system calculi however. Additionally, there is redemonstration of the punctate calcifications in the distal ureter which do not appear to have significantly changed from prior the more distal calcification measures up to 3 mm (7/267) while the more proximal calculus measures 2 mm (7/259). There is a lack of significant urinary tract dilatation which could suggest these to be nonobstructive calculi. No concerning renal masses. No distal calcifications of the right urinary collecting system. Urinary bladder is largely decompressed at the time of exam and therefore poorly evaluated by CT imaging. Mild perivesicular hazy stranding. No other gross bladder abnormality. Stomach/Bowel: Stomach is within normal limits. Appendix appears normal. Question some mild diffuse bowel wall thickening but without frank distention or features of obstruction. Much of the colon also appears slightly thickened and fluid-filled. Appendix is fluid-filled but otherwise unremarkable without significant periappendiceal stranding. Lymphatic: No suspicious or enlarged lymph nodes in the included lymphatic chains. Reproductive: The prostate and seminal vesicles are unremarkable. Other: No abdominopelvic free air or fluid. Musculoskeletal: No suspicious osseous lesions. Increased attenuation towards the vertebral body endplates may suggest some degree of renal osteodystrophy. Review of the MIP images confirms the above findings. IMPRESSION: 1. No evidence of acute aortic syndrome or other acute luminal abnormality of the aorta. 2. Highly comminuted fracture  of the right scapula with separate fracture lines extending throughout the supraspinous and infraspinous fossa as well as into the scapular spine near the base of the coracoid which is incompletely imaged on this exam. Consider further evaluation with dedicated shoulder CT. 3. Scattered multifocal areas of mixed ground-glass and consolidation throughout the lungs with a basilar and peripheral predominance on a background of more dependent atelectatic change. Findings are favored to represent multifocal pneumonia or aspiration. Potential etiologies include atypical viral such as COVID-19. 4. Mild extrahepatic biliary ductal dilatation with the common bile duct measuring up to 9 mm. No visible intraductal gallstones. Recommend correlation with LFTs and if there is concern for biliary obstruction, right upper quadrant ultrasound could be obtained. 5. Redemonstration of the calcification in the corticomedullary junctions, possibly involving the papilla. While several of these may reflect nonobstructive calculi. 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 6. Punctate calculi again seen in the distal left ureter without significant migration from the comparison. There is a lack of significant urinary tract dilatation which could suggest these to be only partially obstructive. 7. Some mild diffuse large and small bowel thickening with a fluid-filled colon. Findings are nonspecific, and may represent a diarrheal illness/enterocolitis. These results were called by telephone at the time of interpretation on 09/27/2019 at 9:25 pm to provider [facility] , who verbally acknowledged these results. Electronically Signed   By: Lovena Le M.D.   On: 09/27/2019 21:25    Procedures Procedures (including critical care time)  Medications Ordered in ED Medications  sodium chloride 0.9 % bolus 500 mL (has no administration in  time range)    ED Course  I have reviewed the triage vital signs and the nursing notes.  Pertinent labs & imaging results that were available during my care of the patient were reviewed by me and considered in my medical decision making (see chart for details).  MDM Rules/Calculators/A&P                      Iv ns. Continuous pulse ox and monitor. Stat labs.   Initial bp low, ns bolus. Pt just had dialysis today ?whether excess fluid taken off. Recheck bp 106/62. Pt denies faintness or dizziness. Denies fever or chills.   Reviewed nursing notes and prior charts for additional history. Ct renal stone results noted from yesterday. Pt denies abd or flank pain today. No fever/chills.   Initial labs reviewed/interpreted by me - chem normal. Wbc normal.   CXR reviewed/interpreted by me - no pna.   covid swab sent.   Await ct imaging.   CT head reviewed/interpreted by me - no acute hem.   BP is improved.   Pt requests pain med. Morphine iv.   Recheck pain improved.   CT chest reviewed/interpreted by me - no dissection. Note made of comminuted scapula fx. Also c/w viral/covid pna.  Will cover w dose iv abx pending labs/cxs.   Pt initiated denied injury/trauma - now, via 2nd interpreter, he does note a fall a couple weeks ago.  Also re-questioned about eyes - indicates at some point in past was told pupils not equal. Pt denies any abrupt or acute change in vision.   Additional labs reviewed/interpreted by me - covid test is positive.   bp initially improved, now soft/low. Pt noted to be on midodrine.   Ivf.   Medicine service consulted for admission. Discussed with Dr Hal Hope - will admit.   Orthopedics consulted - Dr Thea Alken will see.    Chastin Brem was evaluated in Emergency Department on 09/27/2019 for the symptoms described in the history of present illness. He was evaluated in the context of the global COVID-19 pandemic, which necessitated consideration that  the patient might be at risk for infection with the SARS-CoV-2 virus that causes COVID-19. Institutional protocols and algorithms that pertain to the evaluation of patients at risk for COVID-19 are in a state of rapid change based on information released by regulatory bodies including the CDC and federal and state organizations. These policies and algorithms were followed during the patient's care in the ED.  CRITICAL CARE RE: hypotension, ESRD/HD, scapula fx/recent fall.  Performed by: Mirna Mires Total critical care time: 130 minutes Critical care time was exclusive of separately billable procedures and treating other patients. Critical care was necessary to treat or prevent imminent or life-threatening deterioration. Critical care was time spent personally by me on the following activities: development of treatment plan with patient and/or surrogate as well as nursing, discussions with consultants, evaluation of patient's response to treatment, examination of patient, obtaining history from patient or surrogate, ordering and performing treatments and interventions, ordering and review of laboratory studies, ordering and review of radiographic studies, pulse oximetry and re-evaluation of patient's condition.   Final Clinical Impression(s) / ED Diagnoses Final diagnoses:  None    Rx / DC Orders ED Discharge Orders    None            Lajean Saver, MD 09/27/19 2332

## 2019-09-27 NOTE — ED Triage Notes (Signed)
Pt reports chest pain that radiates to his upper back that started after dialysis today, pt received full treatment. Hypotensive in triage 74/45, 81/51. Pt a.o, spanish interpreter used for triage

## 2019-09-27 NOTE — ED Notes (Signed)
Pt states he does not produce urine anymore and cannot provide sample

## 2019-09-28 ENCOUNTER — Ambulatory Visit: Payer: Self-pay | Admitting: Family

## 2019-09-28 ENCOUNTER — Encounter (HOSPITAL_COMMUNITY): Payer: Self-pay | Admitting: Internal Medicine

## 2019-09-28 ENCOUNTER — Inpatient Hospital Stay (HOSPITAL_COMMUNITY): Payer: HRSA Program

## 2019-09-28 DIAGNOSIS — S42109A Fracture of unspecified part of scapula, unspecified shoulder, initial encounter for closed fracture: Secondary | ICD-10-CM | POA: Diagnosis present

## 2019-09-28 DIAGNOSIS — S42101A Fracture of unspecified part of scapula, right shoulder, initial encounter for closed fracture: Secondary | ICD-10-CM

## 2019-09-28 DIAGNOSIS — B20 Human immunodeficiency virus [HIV] disease: Secondary | ICD-10-CM

## 2019-09-28 DIAGNOSIS — U071 COVID-19: Principal | ICD-10-CM

## 2019-09-28 DIAGNOSIS — R079 Chest pain, unspecified: Secondary | ICD-10-CM

## 2019-09-28 DIAGNOSIS — N186 End stage renal disease: Secondary | ICD-10-CM

## 2019-09-28 DIAGNOSIS — E1122 Type 2 diabetes mellitus with diabetic chronic kidney disease: Secondary | ICD-10-CM

## 2019-09-28 LAB — CBC WITH DIFFERENTIAL/PLATELET
Abs Immature Granulocytes: 0.01 10*3/uL (ref 0.00–0.07)
Basophils Absolute: 0 10*3/uL (ref 0.0–0.1)
Basophils Relative: 0 %
Eosinophils Absolute: 0 10*3/uL (ref 0.0–0.5)
Eosinophils Relative: 1 %
HCT: 28.3 % — ABNORMAL LOW (ref 39.0–52.0)
Hemoglobin: 9.2 g/dL — ABNORMAL LOW (ref 13.0–17.0)
Immature Granulocytes: 0 %
Lymphocytes Relative: 20 %
Lymphs Abs: 1 10*3/uL (ref 0.7–4.0)
MCH: 33.9 pg (ref 26.0–34.0)
MCHC: 32.5 g/dL (ref 30.0–36.0)
MCV: 104.4 fL — ABNORMAL HIGH (ref 80.0–100.0)
Monocytes Absolute: 0.4 10*3/uL (ref 0.1–1.0)
Monocytes Relative: 8 %
Neutro Abs: 3.5 10*3/uL (ref 1.7–7.7)
Neutrophils Relative %: 71 %
Platelets: 115 10*3/uL — ABNORMAL LOW (ref 150–400)
RBC: 2.71 MIL/uL — ABNORMAL LOW (ref 4.22–5.81)
RDW: 14.2 % (ref 11.5–15.5)
WBC: 5 10*3/uL (ref 4.0–10.5)
nRBC: 0 % (ref 0.0–0.2)

## 2019-09-28 LAB — TROPONIN I (HIGH SENSITIVITY)
Troponin I (High Sensitivity): 19 ng/L — ABNORMAL HIGH
Troponin I (High Sensitivity): 24 ng/L — ABNORMAL HIGH (ref <18)
Troponin I (High Sensitivity): 28 ng/L — ABNORMAL HIGH (ref <18)

## 2019-09-28 LAB — HEPATIC FUNCTION PANEL
ALT: 52 U/L — ABNORMAL HIGH (ref 0–44)
AST: 109 U/L — ABNORMAL HIGH (ref 15–41)
Albumin: 2.5 g/dL — ABNORMAL LOW (ref 3.5–5.0)
Alkaline Phosphatase: 156 U/L — ABNORMAL HIGH (ref 38–126)
Bilirubin, Direct: 0.2 mg/dL (ref 0.0–0.2)
Indirect Bilirubin: 1.1 mg/dL — ABNORMAL HIGH (ref 0.3–0.9)
Total Bilirubin: 1.3 mg/dL — ABNORMAL HIGH (ref 0.3–1.2)
Total Protein: 6.9 g/dL (ref 6.5–8.1)

## 2019-09-28 LAB — GLUCOSE, CAPILLARY
Glucose-Capillary: 134 mg/dL — ABNORMAL HIGH (ref 70–99)
Glucose-Capillary: 60 mg/dL — ABNORMAL LOW (ref 70–99)
Glucose-Capillary: 196 mg/dL — ABNORMAL HIGH (ref 70–99)
Glucose-Capillary: 46 mg/dL — ABNORMAL LOW (ref 70–99)
Glucose-Capillary: 105 mg/dL — ABNORMAL HIGH (ref 70–99)
Glucose-Capillary: 109 mg/dL — ABNORMAL HIGH (ref 70–99)

## 2019-09-28 LAB — PROCALCITONIN
Procalcitonin: 0.29 ng/mL
Procalcitonin: 0.36 ng/mL

## 2019-09-28 LAB — BASIC METABOLIC PANEL
Anion gap: 17 — ABNORMAL HIGH (ref 5–15)
BUN: 36 mg/dL — ABNORMAL HIGH (ref 6–20)
CO2: 25 mmol/L (ref 22–32)
Calcium: 7.8 mg/dL — ABNORMAL LOW (ref 8.9–10.3)
Chloride: 98 mmol/L (ref 98–111)
Creatinine, Ser: 9.38 mg/dL — ABNORMAL HIGH (ref 0.61–1.24)
GFR calc Af Amer: 7 mL/min — ABNORMAL LOW (ref >60)
GFR calc non Af Amer: 6 mL/min — ABNORMAL LOW (ref >60)
Glucose, Bld: 147 mg/dL — ABNORMAL HIGH (ref 70–99)
Potassium: 4.5 mmol/L (ref 3.5–5.1)
Sodium: 140 mmol/L (ref 135–145)

## 2019-09-28 LAB — CBC
HCT: 29.1 % — ABNORMAL LOW (ref 39.0–52.0)
Hemoglobin: 9.4 g/dL — ABNORMAL LOW (ref 13.0–17.0)
MCH: 33.8 pg (ref 26.0–34.0)
MCHC: 32.3 g/dL (ref 30.0–36.0)
MCV: 104.7 fL — ABNORMAL HIGH (ref 80.0–100.0)
Platelets: 125 10*3/uL — ABNORMAL LOW (ref 150–400)
RBC: 2.78 MIL/uL — ABNORMAL LOW (ref 4.22–5.81)
RDW: 14.3 % (ref 11.5–15.5)
WBC: 3.7 10*3/uL — ABNORMAL LOW (ref 4.0–10.5)
nRBC: 0 % (ref 0.0–0.2)

## 2019-09-28 LAB — TRIGLYCERIDES: Triglycerides: 193 mg/dL — ABNORMAL HIGH (ref <150)

## 2019-09-28 LAB — CREATININE, SERUM
Creatinine, Ser: 8.81 mg/dL — ABNORMAL HIGH (ref 0.61–1.24)
GFR calc Af Amer: 7 mL/min — ABNORMAL LOW (ref >60)
GFR calc non Af Amer: 6 mL/min — ABNORMAL LOW (ref >60)

## 2019-09-28 LAB — D-DIMER, QUANTITATIVE: D-Dimer, Quant: 1.54 ug/mL-FEU — ABNORMAL HIGH (ref 0.00–0.50)

## 2019-09-28 LAB — LACTIC ACID, PLASMA
Lactic Acid, Venous: 0.8 mmol/L (ref 0.5–1.9)
Lactic Acid, Venous: 1 mmol/L (ref 0.5–1.9)

## 2019-09-28 LAB — C-REACTIVE PROTEIN
CRP: 8.6 mg/dL — ABNORMAL HIGH (ref <1.0)
CRP: 9.2 mg/dL — ABNORMAL HIGH (ref <1.0)

## 2019-09-28 LAB — FIBRINOGEN: Fibrinogen: 447 mg/dL (ref 210–475)

## 2019-09-28 LAB — LACTATE DEHYDROGENASE: LDH: 173 U/L (ref 98–192)

## 2019-09-28 LAB — FERRITIN: Ferritin: 4051 ng/mL — ABNORMAL HIGH (ref 24–336)

## 2019-09-28 MED ORDER — HYDROCODONE-ACETAMINOPHEN 5-325 MG PO TABS
1.0000 | ORAL_TABLET | ORAL | Status: DC | PRN
  Administered 2019-09-29 – 2019-09-30 (×3): 2 via ORAL
  Administered 2019-10-01: 1 via ORAL
  Filled 2019-09-28: qty 1
  Filled 2019-09-28 (×3): qty 2

## 2019-09-28 MED ORDER — MIDODRINE HCL 5 MG PO TABS
5.0000 mg | ORAL_TABLET | Freq: Three times a day (TID) | ORAL | Status: DC
  Administered 2019-09-28 – 2019-10-02 (×12): 5 mg via ORAL
  Filled 2019-09-28 (×12): qty 1

## 2019-09-28 MED ORDER — DOXERCALCIFEROL 4 MCG/2ML IV SOLN
7.0000 ug | INTRAVENOUS | Status: DC
  Administered 2019-10-01: 7 ug via INTRAVENOUS

## 2019-09-28 MED ORDER — MIDODRINE HCL 5 MG PO TABS: 5.0000 mg | ORAL_TABLET | Freq: Three times a day (TID) | ORAL | Status: DC

## 2019-09-28 MED ORDER — CHLORHEXIDINE GLUCONATE CLOTH 2 % EX PADS
6.0000 | MEDICATED_PAD | Freq: Every day | CUTANEOUS | Status: DC
  Administered 2019-09-29: 6 via TOPICAL

## 2019-09-28 MED ORDER — INSULIN ASPART PROT & ASPART (70-30 MIX) 100 UNIT/ML ~~LOC~~ SUSP
10.0000 [IU] | Freq: Every day | SUBCUTANEOUS | Status: DC
  Filled 2019-09-28: qty 10

## 2019-09-28 MED ORDER — INSULIN ASPART 100 UNIT/ML ~~LOC~~ SOLN
0.0000 [IU] | Freq: Three times a day (TID) | SUBCUTANEOUS | Status: DC
  Administered 2019-09-29: 1 [IU] via SUBCUTANEOUS

## 2019-09-28 MED ORDER — INSULIN ASPART PROT & ASPART (70-30 MIX) 100 UNIT/ML ~~LOC~~ SUSP
15.0000 [IU] | Freq: Two times a day (BID) | SUBCUTANEOUS | Status: DC
  Administered 2019-09-28: 15 [IU] via SUBCUTANEOUS
  Filled 2019-09-28: qty 10

## 2019-09-28 MED ORDER — SODIUM CHLORIDE 0.9 % IV SOLN
100.0000 mg | Freq: Every day | INTRAVENOUS | Status: AC
  Administered 2019-09-29 – 2019-10-02 (×4): 100 mg via INTRAVENOUS
  Filled 2019-09-28 (×4): qty 20

## 2019-09-28 MED ORDER — MIDODRINE HCL 5 MG PO TABS: 10.0000 mg | ORAL_TABLET | ORAL | Status: DC

## 2019-09-28 MED ORDER — SODIUM CHLORIDE 0.9 % IV SOLN
200.0000 mg | Freq: Once | INTRAVENOUS | Status: AC
  Administered 2019-09-28: 200 mg via INTRAVENOUS
  Filled 2019-09-28: qty 40

## 2019-09-28 MED ORDER — DEXTROSE 50 % IV SOLN
INTRAVENOUS | Status: AC
  Administered 2019-09-28: 50 mL
  Filled 2019-09-28: qty 50

## 2019-09-28 MED ORDER — BICTEGRAVIR-EMTRICITAB-TENOFOV 50-200-25 MG PO TABS
1.0000 | ORAL_TABLET | Freq: Every day | ORAL | Status: DC
  Administered 2019-09-28 – 2019-10-02 (×5): 1 via ORAL
  Filled 2019-09-28 (×5): qty 1

## 2019-09-28 MED ORDER — ONDANSETRON HCL 4 MG/2ML IJ SOLN
4.0000 mg | Freq: Four times a day (QID) | INTRAMUSCULAR | Status: DC | PRN
  Administered 2019-09-30: 4 mg via INTRAVENOUS
  Filled 2019-09-28: qty 2

## 2019-09-28 MED ORDER — ACETAMINOPHEN 325 MG PO TABS
650.0000 mg | ORAL_TABLET | Freq: Four times a day (QID) | ORAL | Status: DC | PRN
  Administered 2019-09-30 – 2019-10-01 (×2): 650 mg via ORAL
  Filled 2019-09-28 (×2): qty 2

## 2019-09-28 MED ORDER — ONDANSETRON HCL 4 MG PO TABS: 4.0000 mg | ORAL_TABLET | Freq: Four times a day (QID) | ORAL | Status: DC | PRN

## 2019-09-28 MED ORDER — HEPARIN SODIUM (PORCINE) 5000 UNIT/ML IJ SOLN
5000.0000 [IU] | Freq: Three times a day (TID) | INTRAMUSCULAR | Status: DC
  Administered 2019-09-28 – 2019-10-02 (×11): 5000 [IU] via SUBCUTANEOUS
  Filled 2019-09-28 (×11): qty 1

## 2019-09-28 MED ORDER — SODIUM CHLORIDE 0.9 % IV SOLN: 2.0000 g | INTRAVENOUS | Status: DC

## 2019-09-28 MED ORDER — INSULIN ASPART PROT & ASPART (70-30 MIX) 100 UNIT/ML ~~LOC~~ SUSP
15.0000 [IU] | Freq: Every day | SUBCUTANEOUS | Status: DC
  Administered 2019-09-29: 15 [IU] via SUBCUTANEOUS

## 2019-09-28 NOTE — Progress Notes (Signed)
Pt arrived to 5W from the ED ~0300.  Alert and oriented.  Pt was able to scoot over onto the unit bed himself.  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   A wheelchair was also brought up from the ED.   Pt speaks very little [place], [place] translator was utilized.  Pt has a fistula in the left arm, pink armband applied.  PIV access x2 in right forearm and right AC.  VSS stable and in no acute distress.  Pt still complained of some chest pain and posterior neck pains.  Skin intact on assessment.  Pt was placed on the wall monitor for telemetry.   MRSA swab collected (hx).  We informed the pt on when and how to call for assistance.

## 2019-09-28 NOTE — Progress Notes (Signed)
Hypoglycemic Event  CBG: 46  Treatment: 4 oz juice/soda and D50 50 mL (25 gm)  Symptoms: None  Follow-up CBG: 196 Time: 13:15   Possible Reasons for Event: Inadequate meal intake  Comments/MD notified: MD. Ghimire notify, pt is asymptomatic.     Alvin Daniels A [employer]

## 2019-09-28 NOTE — Progress Notes (Signed)
PROGRESS NOTE                                                                                                                                                                                                             Patient Demographics:    Alvin Daniels, is a 49 y.o. male, DOB - 14-Sep-1970, ERD:[id number]  Outpatient Primary MD for the patient is Camillia Herter, NP   Admit date - 09/27/2019   LOS - 1  Chief Complaint  Patient presents with  . Chest Pain  . Back Pain       Brief Narrative: Patient is a 49 y.o. male with PMHx of ESRD on HD MWF, HIV on antiretrovirals, bilateral BKA, DM-2, HTN-who sustained a mechanical fall approximately a week prior to this hospitalization-presented to the ED with right-sided chest pain-he was found to have right scapular fracture, and also found to have COVID-19 pneumonia.  See below for further details.  Significant Events: 5/17>> admit to [facility] for right scapula fracture/Covid pneumonia.  COVID-19 medications: Steroids: 5/17>> Remdesivir: 5/17>>  Antibiotics: Rocephin: 5/17>> 5/18 Zithromax: 5/17>> 5/18  Microbiology data: 5/17: Blood culture>> no growth  Significant studies: 5/17: CT angiogram chest/abdomen/pelvis>> highly comminuted right scapular fracture, scattered multi focal groundglass opacities, mild extrahepatic biliary ductal dilatation.  No evidence of acute aortic syndrome.  DVT prophylaxis: SQ heparin  Procedures: None  Consults: Nephrology    Subjective:    Alvin Daniels today has no major complaints-continues to have some pain in his right chest.   Assessment  & Plan :    Covid 19 Viral pneumonia: Not hypoxic-CRP significantly elevated-some cough but otherwise really not that symptomatic.  Given risk factors for severe disease-reasonable to continue IV remdesivir-no role for steroids given lack of hypoxemia.  Doubt bacterial  infection-procalcitonin not significantly elevated-we will go and stop IV Rocephin/Zithromax  Fever: afebrile/febrile Prone/Incentive Spirometry: encouraged incentive spirometry use 3-4/hour. O2 requirements:  SpO2: 93 %   COVID-19 Labs: Recent Labs    09/28/19 0037 09/28/19 0641  DDIMER  --  1.54*  FERRITIN 4,051*  --   LDH 173  --   CRP 8.6* 9.2*    No results found for: BNP  Recent Labs  Lab 09/28/19 0037 09/28/19 0641  PROCALCITON 0.29 0.36    Lab Results  Component Value Date   SARSCOV2NAA POSITIVE (A)  09/27/2019    Right scapular fracture: Following a mechanical fall-evaluated by orthopedics-Dr. Landau-recommendations are for sling management, nonweightbearing to right upper extremity-okay to use wrist/hands.  Follow with orthopedics in 2 weeks.  PT/OT consult pending.  ESRD on HD MWF: Nephrology consulted.  HIV: Continue antiretrovirals  Hypotension: Etiology uncertain-no indication of sepsis or blood loss-probably related to?  Narcotics administered in the ED-we will start scheduled midodrine.  Thrombocytopenia: Appears to be mild-likely secondary to COVID-19-follow.  Extrahepatic biliary dilatation: Has transaminitis which could be from COVID-19-alk phos/bilirubin not significantly elevated-await abdominal ultrasound.  Anemia: Secondary to chronic disease-ESRD/HIV-stable for follow-up.  No indication of blood loss.  History of bilateral BKA: Ambulates with prosthesis  Insulin-dependent DM-2 (A1c on 09/28/2018 6.8): CBGs stable-continue insulin 70/30 15 units twice daily.  Follow and adjust.   Recent Labs    09/28/19 0804  GLUCAP 134*    ABG:    Component Value Date/Time   PHART 7.460 (H) 03/03/2016 1439   PCO2ART 36.9 03/03/2016 1439   PO2ART 34.0 (LL) 03/03/2016 1439   HCO3 26.2 03/03/2016 1439   TCO2 30 05/28/2016 1414   TCO2 30 05/28/2016 1414   O2SAT 69.0 03/03/2016 1439    Vent Settings: N/A  Condition -Stable  Family Communication   : Spoke with patient's friend-Hermina ([phone])  Code Status :  Full Code  Diet :  Diet Order            Diet renal/carb modified with fluid restriction Diet-HS Snack? Nothing; Fluid restriction: 1200 mL Fluid; Room service appropriate? Yes; Fluid consistency: Thin  Diet effective now               Disposition Plan  :   Status is: Inpatient  Remains inpatient appropriate because:Inpatient level of care appropriate due to severity of illness   Dispo: The patient is from: Home              Anticipated d/c is to: Home              Anticipated d/c date is: 3 days              Patient currently is not medically stable to d/c.  Barriers to discharge: Complete 5 days of IV Remdesivir  Antimicorbials  :    Anti-infectives (From admission, onward)   Start     Dose/Rate Route Frequency Ordered Stop   09/29/19 1000  remdesivir 100 mg in sodium chloride 0.9 % 100 mL IVPB     100 mg 200 mL/hr over 30 Minutes Intravenous Daily 09/28/19 0147 10/03/19 0959   09/28/19 2345  cefTRIAXone (ROCEPHIN) 2 g in sodium chloride 0.9 % 100 mL IVPB     2 g 200 mL/hr over 30 Minutes Intravenous Every 24 hours 09/28/19 0145     09/28/19 1000  bictegravir-emtricitabine-tenofovir AF (BIKTARVY) 50-200-25 MG per tablet 1 tablet     1 tablet Oral Daily 09/28/19 0145     09/28/19 0200  remdesivir 200 mg in sodium chloride 0.9% 250 mL IVPB     200 mg 580 mL/hr over 30 Minutes Intravenous Once 09/28/19 0147 09/28/19 0400   09/27/19 2345  azithromycin (ZITHROMAX) 500 mg in sodium chloride 0.9 % 250 mL IVPB     500 mg 250 mL/hr over 60 Minutes Intravenous Every 24 hours 09/27/19 2331     09/27/19 2345  cefTRIAXone (ROCEPHIN) 1 g in sodium chloride 0.9 % 100 mL IVPB  Status:  Discontinued     1 g 200 mL/hr  over 30 Minutes Intravenous Every 24 hours 09/27/19 2331 09/28/19 0145      Inpatient Medications  Scheduled Meds: . bictegravir-emtricitabine-tenofovir AF  1 tablet Oral Daily  . [START ON  09/29/2019] doxercalciferol  7 mcg Intravenous Q M,W,F-HD  . heparin  5,000 Units Subcutaneous Q8H  . insulin aspart  0-6 Units Subcutaneous TID WC  . insulin aspart protamine- aspart  15 Units Subcutaneous BID WC  . midodrine  5 mg Oral TID AC   Continuous Infusions: . azithromycin Stopped (09/28/19 0159)  . cefTRIAXone (ROCEPHIN)  IV    . [START ON 09/29/2019] remdesivir 100 mg in NS 100 mL     PRN Meds:.HYDROcodone-acetaminophen, ondansetron **OR** ondansetron (ZOFRAN) IV   Time Spent in minutes  25  See all Orders from today for further details   Oren Binet M.D on 09/28/2019 at 11:44 AM  To page go to [email or web address] - use universal password  [facility] -  Office  [phone]    Objective:   Vitals:   09/28/19 0146 09/28/19 0200 09/28/19 0300 09/28/19 0400  BP: 99/61  122/63 (!) 103/59  Pulse: 92  99 91  Resp: 19  13 13   Temp:  98.4 F (36.9 C) 98.4 F (36.9 C) 99.9 F (37.7 C)  TempSrc:  Oral Oral Axillary  SpO2: 96%  94% 93%  Weight:   52.2 kg   Height:        Wt Readings from Last 3 Encounters:  09/28/19 52.2 kg  09/21/19 56.7 kg  06/09/19 62.1 kg     Intake/Output Summary (Last 24 hours) at 09/28/2019 1144 Last data filed at 09/28/2019 0600 Gross per 24 hour  Intake 1576.76 ml  Output --  Net 1576.76 ml     Physical Exam Gen Exam:Alert awake-not in any distress HEENT:atraumatic, normocephalic Chest: B/L clear to auscultation anteriorly CVS:S1S2 regular Abdomen:soft non tender, non distended Extremities: Bilateral BKA-stump unremarkable. Neurology: Non focal Skin: no rash   Data Review:    CBC Recent Labs  Lab 09/26/19 2048 09/27/19 1607 09/28/19 0417 09/28/19 0628  WBC 7.7 5.0 3.7* 5.0  HGB 11.6* 10.3* 9.4* 9.2*  HCT 35.2* 31.0* 29.1* 28.3*  PLT 180 143* 125* 115*  MCV 105.4* 103.0* 104.7* 104.4*  MCH 34.7* 34.2* 33.8 33.9  MCHC 33.0 33.2 32.3 32.5  RDW 14.4 14.0 14.3 14.2  LYMPHSABS 1.1  --   --  1.0  MONOABS 0.9   --   --  0.4  EOSABS 0.1  --   --  0.0  BASOSABS 0.0  --   --  0.0    Chemistries  Recent Labs  Lab 09/27/19 1607 09/28/19 0417 09/28/19 0628  NA 141  --  140  K 3.8  --  4.5  CL 95*  --  98  CO2 28  --  25  GLUCOSE 123*  --  147*  BUN 22*  --  36*  CREATININE 7.16* 8.81* 9.38*  CALCIUM 8.2*  --  7.8*  AST  --   --  109*  ALT  --   --  52*  ALKPHOS  --   --  156*  BILITOT  --   --  1.3*   ------------------------------------------------------------------------------------------------------------------ Recent Labs    09/28/19 0037  TRIG 193*    Lab Results  Component Value Date   HGBA1C 6.8 09/28/2018   ------------------------------------------------------------------------------------------------------------------ No results for input(s): TSH, T4TOTAL, T3FREE, THYROIDAB in the last 72 hours.  Invalid input(s): FREET3 ------------------------------------------------------------------------------------------------------------------ Recent Labs  09/28/19 0037  FERRITIN 4,051*    Coagulation profile Recent Labs  Lab 09/27/19 1608  INR 1.1    Recent Labs    09/28/19 0641  DDIMER 1.54*    Cardiac Enzymes No results for input(s): CKMB, TROPONINI, MYOGLOBIN in the last 168 hours.  Invalid input(s): CK ------------------------------------------------------------------------------------------------------------------ No results found for: BNP  Micro Results Recent Results (from the past 240 hour(s))  Blood Culture (routine x 2)     Status: None (Preliminary result)   Collection Time: 09/27/19  4:45 PM   Specimen: BLOOD  Result Value Ref Range Status   Specimen Description BLOOD RIGHT ANTECUBITAL  Final   Special Requests   Final    BOTTLES DRAWN AEROBIC AND ANAEROBIC Blood Culture results may not be optimal due to an inadequate volume of blood received in culture bottles   Culture   Final    NO GROWTH < 24 HOURS Performed at [facility], [place] [address] [place], [place] [address]    Report Status PENDING  Incomplete  Blood Culture (routine x 2)     Status: None (Preliminary result)   Collection Time: 09/27/19  4:50 PM   Specimen: BLOOD RIGHT HAND  Result Value Ref Range Status   Specimen Description BLOOD RIGHT HAND  Final   Special Requests   Final    BOTTLES DRAWN AEROBIC AND ANAEROBIC Blood Culture adequate volume   Culture   Final    NO GROWTH < 24 HOURS Performed at [facility], [place] [address] [place], [place] [address]    Report Status PENDING  Incomplete  SARS Coronavirus 2 by RT PCR (hospital order, performed in [facility] hospital lab) Nasopharyngeal Nasopharyngeal Swab     Status: Abnormal   Collection Time: 09/27/19  9:17 PM   Specimen: Nasopharyngeal Swab  Result Value Ref Range Status   SARS Coronavirus 2 POSITIVE (A) NEGATIVE Final    Comment: RESULT CALLED TO, READ BACK BY AND VERIFIED WITH: B BECK RN 09/27/19 2255 JDW (NOTE) SARS-CoV-2 target nucleic acids are DETECTED SARS-CoV-2 RNA is generally detectable in upper respiratory specimens  during the acute phase of infection.  Positive results are indicative  of the presence of the identified virus, but do not rule out bacterial infection or co-infection with other pathogens not detected by the test.  Clinical correlation with patient history and  other diagnostic information is necessary to determine patient infection status.  The expected result is negative. Fact Sheet for Patients:   StrictlyIdeas.no  Fact Sheet for Healthcare Providers:   BankingDealers.co.za   This test is not yet approved or cleared by the [place] FDA and  has been authorized for detection and/or diagnosis of SARS-CoV-2 by FDA under an Emergency Use Authorization (EUA).  This EUA will remain in effect (meaning this test can be used)  for the duration of  the COVID-19 declaration under Section 564(b)(1) of the Act,  21 U.S.C. section 360-bbb-3(b)(1), unless the authorization is terminated or revoked sooner. Performed at [facility], [place] [address], [place], [place] [address]     Radiology Reports CT HEAD WO CONTRAST  Result Date: 09/27/2019 CLINICAL DATA:  Headache EXAM: CT HEAD WITHOUT CONTRAST TECHNIQUE: Contiguous axial images were obtained from the base of the skull through the vertex without intravenous contrast. COMPARISON:  CT brain 06/21/2019 FINDINGS: Brain: No evidence of acute infarction, hemorrhage, hydrocephalus, extra-axial collection or mass lesion/mass effect. Vascular: No hyperdense vessel or unexpected calcification. Skull: Normal. Negative for  fracture or focal lesion. Sinuses/Orbits: Mucosal thickening in the ethmoid sinuses. Other: None. IMPRESSION: Negative non contrasted CT appearance of the brain.  Sinus disease. Electronically Signed   By: Donavan Foil M.D.   On: 09/27/2019 19:17   US Abdomen Complete  Result Date: 09/28/2019 CLINICAL DATA:  Acute generalized abdominal pain. EXAM: ABDOMEN ULTRASOUND COMPLETE COMPARISON:  Sep 26, 2019.  April 20, 2014. FINDINGS: Gallbladder: No gallstones or wall thickening visualized. No sonographic Murphy sign noted by sonographer. Common bile duct: Diameter: 13 mm which is unchanged compared to prior exam. No common bile duct stone is noted. Liver: No focal lesion identified. Within normal limits in parenchymal echogenicity. Portal vein is patent on color Doppler imaging with normal direction of blood flow towards the liver. IVC: No abnormality visualized. Pancreas: Visualized portion unremarkable. Spleen: Size and appearance within normal limits. Right Kidney: Length: 8.9 cm. 9 mm nonobstructive calculus seen in midpole of right kidney. Echogenicity within normal limits. No mass or hydronephrosis visualized. Left Kidney: Length: 9.7 cm. 1.4 cm nonobstructive calculus seen in lower pole. Echogenicity within normal limits. No mass or  hydronephrosis visualized. Abdominal aorta: No aneurysm visualized. Other findings: None. IMPRESSION: Bilateral nonobstructive nephrolithiasis. No hydronephrosis or renal obstruction is noted. Stable dilatation of common bile duct which is unchanged compared to prior exams. No definite evidence of calculus is noted. Electronically Signed   By: Marijo Conception M.D.   On: 09/28/2019 09:20   DG Chest Portable 1 View  Result Date: 09/27/2019 CLINICAL DATA:  Chest pain EXAM: PORTABLE CHEST 1 VIEW COMPARISON:  August 27, 2018 FINDINGS: The heart size and mediastinal contours are within normal limits. No new consolidation or edema. No pleural effusion or pneumothorax. No new osseous abnormality. IMPRESSION: No acute process in the chest. Electronically Signed   By: Macy Mis M.D.   On: 09/27/2019 16:26   CT Renal Stone Study  Result Date: 09/26/2019 CLINICAL DATA:  Left flank pain for 3 days, on dialysis EXAM: CT ABDOMEN AND PELVIS WITHOUT CONTRAST TECHNIQUE: Multidetector CT imaging of the abdomen and pelvis was performed following the standard protocol without IV contrast. COMPARISON:  CT 04/01/2016 FINDINGS: Lower chest: Atelectatic changes present in the lung bases. Normal heart size. No pericardial effusion. Coronary artery calcifications are present. Hepatobiliary: No focal liver abnormality is seen within the limitations of this unenhanced CT. No gallstones, gallbladder wall thickening, or biliary dilatation. Pancreas: Unremarkable. No pancreatic ductal dilatation or surrounding inflammatory changes. Spleen: Normal in size without focal abnormality. Adrenals/Urinary Tract: Normal adrenal glands. Redemonstration of extensive calcification near the corticomedullary junctions which could reflect some medullary nephrocalcinosis versus urolithiasis. No frank hydronephrosis is seen however there are several hyperattenuating foci along the course of the distal ureter, larger distal calcification measuring up to  3 (5/83, 2/67). Thinner more elongated 2 mm calculus is seen just proximal (2/64, 5/85). No right ureteral calculi. Urinary bladder is largely decompressed at the time of exam and therefore poorly evaluated by CT imaging. No gross bladder abnormality. Stomach/Bowel: Distal esophagus, stomach and duodenal sweep are unremarkable. No small bowel wall thickening or dilatation. No evidence of obstruction. A normal appendix is visualized. Much of the colon is fluid-filled without edematous mural thickening or distension. Vascular/Lymphatic: Atherosclerotic calcifications throughout the abdominal aorta and branch vessels. No aneurysm or ectasia. No enlarged abdominopelvic lymph nodes. Reproductive: The prostate and seminal vesicles are unremarkable. Other: No abdominopelvic free fluid or free gas. No bowel containing hernias. Musculoskeletal: Some focal increased attenuate about the vertebral body  endplates may reflect early changes of renal osteodystrophy. No acute osseous abnormality or suspicious osseous lesion. IMPRESSION: 1. There at least 2 small calculi seen within the distal left ureter measuring up to 3 mm with only mild ureterectasis but no frank hydronephrosis. 2. More diffuse calcification about the corticomedullary junctions could reflect additional nonobstructing nephrolithiasis or medullary nephrocalcinosis. 3. Much of the colon is fluid-filled without edematous mural thickening or distension. Findings are nonspecific but can be seen with diarrheal illness. Correlate with patient's symptoms 4. Coronary artery calcifications. 5. Aortic Atherosclerosis (ICD10-I70.0). 6. 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 Electronically Signed   By: Lovena Le M.D.   On: 09/26/2019 19:53   CT Angio Chest/Abd/Pel for Dissection W and/or W/WO  Result Date: 09/27/2019 CLINICAL DATA:  Left flank pain, hypotensive EXAM: CT ANGIOGRAPHY CHEST, ABDOMEN AND PELVIS TECHNIQUE: Non-contrast CT of the chest was initially  obtained. Multidetector CT imaging through the chest, abdomen and pelvis was performed using the standard protocol during bolus administration of intravenous contrast. Multiplanar reconstructed images and MIPs were obtained and reviewed to evaluate the vascular anatomy. CONTRAST:  59m OMNIPAQUE IOHEXOL 350 MG/ML SOLN COMPARISON:  CT renal colic [id number]: CTA CHEST FINDINGS Cardiovascular: Noncontrast CT of the chest reveals no abnormal hyperdense mural thickening of the thoracic aorta to suggest intramural hematoma. Postcontrast administration there is satisfactory arterial phase enhancement. The aortic root and ascending aorta is suboptimally assessed given cardiac pulsation artifact. The aorta is normal caliber. No convincing features of dissection flap, minimal aortic injury, or other acute luminal abnormality of the aorta is seen. No periaortic stranding or hemorrhage. Minimal plaque seen within the aortic arch. The left vertebral artery arises directly from the aortic arch. Remaining proximal great vessels are unremarkable aside from minimal calcific plaque in the right subclavian artery. Central pulmonary arteries are normal caliber. Satisfactory pulmonary arterial opacification no central, lobar or segmental filling defects are identified. Normal heart size. No pericardial effusion. Mediastinum/Nodes: Calcified lymph nodes are present in the right hilar region. May reflect sequela of remote granulomatous disease. No worrisome mediastinal, hilar or axillary adenopathy. No mediastinal fluid or gas. Normal thyroid gland and thoracic inlet. No acute abnormality of the trachea or esophagus. Lungs/Pleura: Scattered multifocal areas of mixed ground-glass and consolidation throughout the lungs with a basilar and peripheral predominance on a background of more dependent atelectatic change. No pneumothorax or effusion. No convincing features of edema. No conspicuous pulmonary nodules or masses.  Musculoskeletal: There is a highly comminuted fracture of the right scapula with separate fracture lines extending throughout the supraspinous and infraspinous fossa and with extension through the medial and lateral borders as well as into the scapular spine near the base of the coracoid which is incompletely imaged on this exam. There is extensive overlying contusive change of the musculature of the posterior shoulder. There is a remote posttraumatic deformity of the distal left clavicle. No other acute or suspicious osseous lesions. Review of the MIP images confirms the above findings. CTA ABDOMEN AND PELVIS FINDINGS VASCULAR Aorta: Atherosclerotic plaque within the normal caliber aorta. No significant stenosis. No evidence of aneurysm, dissection or vasculitis. Celiac: Widely patent ostium. Calcified and noncalcified plaque within the splenic artery with minimal multifocal plaque narrowing but no other significant stenosis. No evidence of aneurysm, dissection or vasculitis. SMA: Normally opacified without evidence of aneurysm, dissection, vasculitis or significant stenosis. Renals: Paired renal arteries bilaterally. Renal arteries are patent without evidence of aneurysm, dissection, vasculitis or fibromuscular dysplasia. IMA: Mild narrowing at the IMA  ostium. More distal vessel is normally opacified without significant stenosis. No evidence of aneurysm, dissection or vasculitis. Inflow: Plaque seen within the right common and internal iliac artery without significant flow-limiting stenosis. No evidence of aneurysm, dissection or vasculitis. Plaque seen throughout the proximal outflow vessels as well without significant stenosis. Veins: No obvious venous abnormality within the limitations of this arterial phase study. Review of the MIP images confirms the above findings. NON-VASCULAR Hepatobiliary: No focal liver abnormality is seen. No visible gallstones or gallbladder wall thickening. No pericholecystic fluid.  Mild extrahepatic biliary ductal dilatation with the common bile duct measuring up to 9 mm. No visible intraductal gallstones. Pancreas: Unremarkable. No pancreatic ductal dilatation or surrounding inflammatory changes. Spleen: Normal in size without focal abnormality. Adrenals/Urinary Tract: Normal adrenal glands. Redemonstration of the calcification in the corticomedullary junctions, possibly involving the papilla. Few do appear to reflect collecting system calculi however. Additionally, there is redemonstration of the punctate calcifications in the distal ureter which do not appear to have significantly changed from prior the more distal calcification measures up to 3 mm (7/267) while the more proximal calculus measures 2 mm (7/259). There is a lack of significant urinary tract dilatation which could suggest these to be nonobstructive calculi. No concerning renal masses. No distal calcifications of the right urinary collecting system. Urinary bladder is largely decompressed at the time of exam and therefore poorly evaluated by CT imaging. Mild perivesicular hazy stranding. No other gross bladder abnormality. Stomach/Bowel: Stomach is within normal limits. Appendix appears normal. Question some mild diffuse bowel wall thickening but without frank distention or features of obstruction. Much of the colon also appears slightly thickened and fluid-filled. Appendix is fluid-filled but otherwise unremarkable without significant periappendiceal stranding. Lymphatic: No suspicious or enlarged lymph nodes in the included lymphatic chains. Reproductive: The prostate and seminal vesicles are unremarkable. Other: No abdominopelvic free air or fluid. Musculoskeletal: No suspicious osseous lesions. Increased attenuation towards the vertebral body endplates may suggest some degree of renal osteodystrophy. Review of the MIP images confirms the above findings. IMPRESSION: 1. No evidence of acute aortic syndrome or other acute  luminal abnormality of the aorta. 2. Highly comminuted fracture of the right scapula with separate fracture lines extending throughout the supraspinous and infraspinous fossa as well as into the scapular spine near the base of the coracoid which is incompletely imaged on this exam. Consider further evaluation with dedicated shoulder CT. 3. Scattered multifocal areas of mixed ground-glass and consolidation throughout the lungs with a basilar and peripheral predominance on a background of more dependent atelectatic change. Findings are favored to represent multifocal pneumonia or aspiration. Potential etiologies include atypical viral such as COVID-19. 4. Mild extrahepatic biliary ductal dilatation with the common bile duct measuring up to 9 mm. No visible intraductal gallstones. Recommend correlation with LFTs and if there is concern for biliary obstruction, right upper quadrant ultrasound could be obtained. 5. Redemonstration of the calcification in the corticomedullary junctions, possibly involving the papilla. While several of these may reflect nonobstructive calculi. 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 6. Punctate calculi again seen in the distal left ureter without significant migration from the comparison. There is a lack of significant urinary tract dilatation which could suggest these to be only partially obstructive. 7. Some mild diffuse large and small bowel thickening with a fluid-filled colon. Findings are nonspecific, and may represent a diarrheal illness/enterocolitis. These results were called by telephone at the time of interpretation  on 09/27/2019 at 9:25 pm to provider [facility] , who verbally acknowledged these results. Electronically Signed   By: Lovena Le M.D.   On: 09/27/2019 21:25

## 2019-09-28 NOTE — Consult Note (Signed)
Renal Service Consult Note [facility]  Alvin Daniels 09/28/2019 Sol Blazing Requesting Physician:  Dr Sloan Leiter, Chauncey Cruel.   Reason for Consult:  ESRD pt w/  HPI: The patient is a 49 y.o. year-old w/ hx HIV, ESRD and IDDM presenting to ED yest w/ back and chest pain. In ED BP's were low, pt was post - HD. CT abd done on prior ED visit 5/16 showed renal stone. Labs from 5/17 yest were okay for esrd pt. CXR neg.  CT chest done which showed comminuted scapular fracture, also findings c/w viral PNA.  Covid came back +.  Pt was admitted. Asked to see for ESRD.    Pt w/o any cough, SOB or CP.  Some R back/ shoulder pain. No HD problems recently. Discussed his covid status for a long time through interpreter and tried to explain that he could still have Covid infection and not "feel sick".    ROS   no joint pain   no HA  no blurry vision  no rash  no diarrhea  no nausea/ vomiting   Past Medical History  Past Medical History:  Diagnosis Date  . AIDS ([place]) 11/22/2014  . Back pain 06/09/2019  . BKA stump complication ([place]) 06/06/5807  . Chronic diarrhea   . Chronic hepatitis C without hepatic coma ([place]) 11/22/2014  . 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 Diabetic neuropathy ([place])   . ESRD (end stage renal disease) on dialysis [facility])    "TTS; don't remember street name" (05/03/2014)  . Hepatitis C   . HIV INFECTION 06/27/2010   Qualifier: Diagnosis of  By: Nickola Major CMA ( [place]), Geni Bers    . Hypotension 06/02/2012  . Metabolic bone disease 01/18/3381  . MRSA infection   . Normocytic anemia 06/17/2012  . Pancreatitis   . Pressure ulcer of BKA stump, stage 2 ([place]) 11/22/2014  . Renal disorder   . Severe protein-calorie malnutrition ([place]) 06/17/2012  . Uncontrolled diabetes mellitus with complications ([place]) 09/15/3974   Annotation: uncontrolled Qualifier: Diagnosis of  By: Nickola Major CMA Deborra Medina), Geni Bers     Past Surgical History  Past Surgical History:   Procedure Laterality Date  . AMPUTATION Left 04/20/2014   Procedure: 3rd toe amputation, 4th Toe Amputation,  5th Toe Amputation;  Surgeon: Newt Minion, MD;  Location: [place];  Service: Orthopedics;  Laterality: Left;  . AMPUTATION Left 05/02/2014   Procedure: Midfoot Amputation;  Surgeon: Newt Minion, MD;  Location: [place];  Service: Orthopedics;  Laterality: Left;  . AMPUTATION Left 06/17/2014   Procedure: AMPUTATION BELOW KNEE;  Surgeon: Newt Minion, MD;  Location: [place];  Service: Orthopedics;  Laterality: Left;  . AMPUTATION Right 09/04/2018   Procedure: RIGHT BELOW KNEE AMPUTATION;  Surgeon: Newt Minion, MD;  Location: [place];  Service: Orthopedics;  Laterality: Right;  RIGHT BELOW KNEE AMPUTATION  . AV FISTULA PLACEMENT Left   . AV FISTULA PLACEMENT Left 05/10/2016   Procedure: Creation Left Arm Brachiocephalic Arteriovenous Fistula and Ligation of Radiocephalic Fistula;  Surgeon: Angelia Mould, MD;  Location: [place];  Service: Vascular;  Laterality: Left;  . FEMUR IM NAIL Left 08/17/2016   Procedure: INTRAMEDULLARY (IM) RETROGRADE FEMORAL NAILING;  Surgeon: Marybelle Killings, MD;  Location: [place];  Service: Orthopedics;  Laterality: Left;  . FOOT AMPUTATION THROUGH ANKLE Left 12/'21/2015   midfoot  . IR GENERIC HISTORICAL Left 05/01/2016   IR THROMBECTOMY AV FISTULA W/THROMBOLYSIS/PTA INC/SHUNT/IMG LEFT 05/01/2016 [name],  MD [facility]  . IR GENERIC HISTORICAL  05/01/2016   IR US GUIDE VASC ACCESS LEFT 05/01/2016 [name], MD [facility]  . IR GENERIC HISTORICAL  05/07/2016   IR FLUORO GUIDE CV LINE RIGHT 05/07/2016 Corrie Mckusick, DO [facility]  . IR GENERIC HISTORICAL  05/07/2016   IR US GUIDE VASC ACCESS RIGHT 05/07/2016 Corrie Mckusick, DO [facility]  . IR GENERIC HISTORICAL  05/22/2016   IR US GUIDE VASC ACCESS RIGHT 05/22/2016 Greggory Keen, MD [facility]  . IR GENERIC HISTORICAL  05/22/2016   IR FLUORO GUIDE CV LINE RIGHT 05/22/2016 Greggory Keen, MD  [facility]  . IR REMOVAL TUN CV CATH W/O FL  08/21/2016  . PERIPHERAL VASCULAR CATHETERIZATION Left 05/09/2016   Procedure: A/V Fistulagram;  Surgeon: Angelia Mould, MD;  Location: [name] [facility];  Service: Cardiovascular;  Laterality: Left;  arm  . TEE WITHOUT CARDIOVERSION N/A 06/22/2018   Procedure: TRANSESOPHAGEAL ECHOCARDIOGRAM (TEE);  Surgeon: Jerline Pain, MD;  Location: [facility] ENDOSCOPY;  Service: Cardiovascular;  Laterality: N/A;   Family History  Family History  Problem Relation Age of Onset  . Diabetes Mother   . Diabetes Father    Social History  reports that he has never smoked. He has never used smokeless tobacco. He reports that he does not drink alcohol or use drugs. Allergies No Known Allergies Home medications Prior to Admission medications   Medication Sig Start Date End Date Taking? Authorizing Provider  acetaminophen (TYLENOL) 325 MG tablet Take 2 tablets (650 mg total) by mouth every 6 (six) hours as needed for mild pain. Patient not taking: Reported on 09/26/2019 11/24/18   Carmin Muskrat, MD  Amino Acids-Protein Hydrolys (FEEDING SUPPLEMENT, PRO-STAT SUGAR FREE 64,) LIQD Take 30 mLs by mouth 2 (two) times daily. Patient not taking: Reported on 06/09/2019 09/07/18   Lavina Hamman, MD  bictegravir-emtricitabine-tenofovir AF (BIKTARVY) 50-200-25 MG TABS tablet Take 1 tablet by mouth daily. 06/09/19   Truman Hayward, MD  Blood Glucose Monitoring Suppl (TRUE METRIX METER) DEVI 1 each by Does not apply route 3 (three) times daily. Patient not taking: Reported on 06/09/2019 09/15/18   [name], MD  docusate sodium (COLACE) 100 MG capsule Take 1 capsule (100 mg total) by mouth 2 (two) times daily as needed for mild constipation. Patient not taking: Reported on 06/09/2019 09/07/18   Lavina Hamman, MD  glucose blood (TRUE METRIX BLOOD GLUCOSE TEST) test strip 1 each by Other route 3 (three) times daily. 09/15/18   [name], MD   HYDROcodone-acetaminophen (NORCO/VICODIN) 5-325 MG tablet Take 1-2 tablets by mouth every 4 (four) hours as needed. 09/26/19   Lacretia Leigh, MD  insulin NPH-regular Human (70-30) 100 UNIT/ML injection Inject 15 Units into the skin 2 (two) times daily with a meal. 09/28/18   [name], MD  Insulin Syringe-Needle U-100 (BD INSULIN SYRINGE ULTRAFINE) 31G X 15/64" 0.5 ML MISC 1 each by Does not apply route 2 (two) times daily. Please dispense needles as well. 09/28/18   [name], MD  metaxalone (SKELAXIN) 800 MG tablet Take 1 tablet (800 mg total) by mouth 3 (three) times daily. 09/26/19   Lacretia Leigh, MD  midodrine (PROAMATINE) 10 MG tablet Take 1 tablet (10 mg total) by mouth Every Tuesday,Thursday,and Saturday with dialysis. 09/08/18   Lavina Hamman, MD  Nutritional Supplements (FEEDING SUPPLEMENT, NEPRO CARB STEADY,) LIQD Take 237 mLs by mouth 2 (two) times daily between meals. Patient not taking: Reported on 09/26/2019  09/07/18   Lavina Hamman, MD  ondansetron (ZOFRAN ODT) 4 MG disintegrating tablet Take 1 tablet (4 mg total) by mouth every 8 (eight) hours as needed for nausea or vomiting. 06/21/19   Joy, Shawn C, PA-C  TRUEplus Lancets 28G MISC 1 each by Does not apply route 3 (three) times daily. 09/15/18   [name], MD     Vitals:   09/28/19 0146 09/28/19 0200 09/28/19 0300 09/28/19 0400  BP: 99/61  122/63 (!) 103/59  Pulse: 92  99 91  Resp: 19  13 13   Temp:  98.4 F (36.9 C) 98.4 F (36.9 C) 99.9 F (37.7 C)  TempSrc:  Oral Oral Axillary  SpO2: 96%  94% 93%  Weight:   52.2 kg   Height:       Exam Gen alert, no distress, calm No rash, cyanosis or gangrene Sclera anicteric, throat clear  No jvd or bruits Chest clear bilat to bases no rales, wheezing or bronchial BS RRR no MRG Abd soft ntnd no mass or ascites +bs GU normal male MS no joint effusions or deformity Ext R BKA no edema, no LLE edema Neuro is alert, Ox 3 , nf LUE AVF+bruit   Home meds:  -  biktarvy 1 qd  - insulin  70-30 15u bid  - vicodin prn/ skelaxin 800 tid/ zofran prn  - midodrine 10mg  preHD TTS  - prn's/ vitamins/ supplements    OP HD: MWF [facility]   4h  54.5kg  400/1.5  2/2 bath  LUE AVF  Hep 2000    hect 7  mirc 60 q 2 last 4/14  full HD 5/17   Assessment/ Plan: 1. COVID +infection - w/ + CT changes and low grade fevers. Pt not real symptomatic. Started on IV remdesivir and IV abx for bact PNA. Per primary.  2. ESRD - on HD MWF. Had full HD Monday. HD tomorrow.  3. BP/volume - 2kg under dry wt, no vol excess on exam. Chronic hypotension on midodrine pre HD. Min UF w/ HD wed 4. HIV - on ART 5. DM - on insulin 6. Anemia ckd - Hb 9.2, last esa in early April. Restart if needed. Check Fe stores.  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 binder      Kelly Splinter  MD 09/28/2019, 10:33 AM  Recent Labs  Lab 09/28/19 0417 09/28/19 0628  WBC 3.7* 5.0  HGB 9.4* 9.2*   Recent Labs  Lab 09/27/19 1607 09/27/19 1607 09/28/19 0417 09/28/19 0628  K 3.8  --   --  4.5  BUN 22*  --   --  36*  CREATININE 7.16*   < > 8.81* 9.38*  CALCIUM 8.2*  --   --  7.8*   < > = values in this interval not displayed.

## 2019-09-28 NOTE — Progress Notes (Signed)
Orthopedic Tech Progress Note Patient Details:  Alvin Daniels 06/22/1970 [id number] Unable to be used right ow due to IV in arm Ortho Devices Type of Ortho Device: Arm sling Ortho Device/Splint Location: RUE Ortho Device/Splint Interventions: Application   Post Interventions Patient Tolerated: Unable to use device properly Instructions Provided: Adjustment of device   Alvin Daniels E Alvin Daniels 09/28/2019, 1:24 AM

## 2019-09-28 NOTE — Progress Notes (Signed)
Orthopedic Tech Progress Note Patient Details:  Alvin Daniels [birth year] [id number]  Ortho Devices Type of Ortho Device: Arm sling Ortho Device/Splint Location: delivered to rn. the sling was not in the room. Ortho Device/Splint Interventions: Application   Post Interventions Patient Tolerated: Unable to use device properly Instructions Provided: Adjustment of device   Karolee Stamps 09/28/2019, 9:18 PM

## 2019-09-28 NOTE — Progress Notes (Signed)
Renal Navigator notified OP HD clinic/[facility] of patient's COVID positive test. Navigator will follow closely. Patient will need to receive HD in isolation for 21 days (or two negative tests). [facility] will treat him on a MWF 3rd shift schedule, which Navigator will confirm closer to patient's discharge from the hospital.  Alphonzo Cruise,  Renal Navigator [phone]

## 2019-09-28 NOTE — H&P (Signed)
History and Physical    Alvin Daniels UTM:[id number] DOB: [birth year] DOA: 09/27/2019  PCP: Camillia Herter, NP  Patient coming from: Home.  Spanish interpreter used.  Chief Complaint: Right-sided chest pain.  HPI: Alvin Daniels is a 49 y.o. male with history of ESRD on hemodialysis Monday Wednesday and Friday started experiencing worsening pain in the right sided chest after dialysis and was brought to the ER.  Patient states he had a fall after he tripped on the walk he uses for walking about a week ago since then he has been hurting on his right shoulder which hurts on deep breathing and moving his right upper extremity.  Pain has been progressively worsening so he decided to come to the ER for the dialysis.  Denies any productive cough abdominal pain nausea vomiting or diarrhea or fever chills.  ED Course: In the ER patient was hypotensive patient does have chronic hypotension on midodrine.  Temperature was around 99.9 patient was given fluid bolus of 5 cc from the blood pressure improved labs were showing normal WBC count hemoglobin 10.3 platelets 143 potassium 3.8 lactic acid 1.6 Covid test was positive since patient had persistent pain CT dissection study was done which shows bilateral infiltrates concerning for pneumonia in addition also showed right scapular fracture.  Also showed biliary ductal dilatation.  Abdomen appears benign on exam.  Labs show CRP of 8.6 ferritin 4000 procalcitonin 0.29.  Blood cultures were obtained and patient was started on empiric antibiotics for possible sepsis since patient has immunocompromise state including HIV and previous history of MRSA bacteremia and started on remdesivir for Covid infection.  Review of Systems: As per HPI, rest all negative.   Past Medical History:  Diagnosis Date  . AIDS ([place]) 11/22/2014  . Back pain 06/09/2019  . BKA stump complication ([place]) 5/68/1275  . Chronic diarrhea   . Chronic hepatitis C without hepatic  coma ([place]) 11/22/2014  . 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 Diabetic neuropathy ([place])   . ESRD (end stage renal disease) on dialysis [facility])    "TTS; don't remember street name" (05/03/2014)  . Hepatitis C   . HIV INFECTION 06/27/2010   Qualifier: Diagnosis of  By: Nickola Major CMA ( [place]), Geni Bers    . Hypotension 06/02/2012  . Metabolic bone disease 05/20/15  . MRSA infection   . Normocytic anemia 06/17/2012  . Pancreatitis   . Pressure ulcer of BKA stump, stage 2 ([place]) 11/22/2014  . Renal disorder   . Severe protein-calorie malnutrition ([place]) 06/17/2012  . Uncontrolled diabetes mellitus with complications ([place]) 4/94/4967   Annotation: uncontrolled Qualifier: Diagnosis of  By: Nickola Major CMA Deborra Medina), Geni Bers      Past Surgical History:  Procedure Laterality Date  . AMPUTATION Left 04/20/2014   Procedure: 3rd toe amputation, 4th Toe Amputation,  5th Toe Amputation;  Surgeon: Newt Minion, MD;  Location: [place];  Service: Orthopedics;  Laterality: Left;  . AMPUTATION Left 05/02/2014   Procedure: Midfoot Amputation;  Surgeon: Newt Minion, MD;  Location: [place];  Service: Orthopedics;  Laterality: Left;  . AMPUTATION Left 06/17/2014   Procedure: AMPUTATION BELOW KNEE;  Surgeon: Newt Minion, MD;  Location: [place];  Service: Orthopedics;  Laterality: Left;  . AMPUTATION Right 09/04/2018   Procedure: RIGHT BELOW KNEE AMPUTATION;  Surgeon: Newt Minion, MD;  Location: [place];  Service: Orthopedics;  Laterality: Right;  RIGHT BELOW KNEE AMPUTATION  . AV FISTULA PLACEMENT Left   .  AV FISTULA PLACEMENT Left 05/10/2016   Procedure: Creation Left Arm Brachiocephalic Arteriovenous Fistula and Ligation of Radiocephalic Fistula;  Surgeon: Angelia Mould, MD;  Location: [place];  Service: Vascular;  Laterality: Left;  . FEMUR IM NAIL Left 08/17/2016   Procedure: INTRAMEDULLARY (IM) RETROGRADE FEMORAL NAILING;  Surgeon: Marybelle Killings, MD;  Location: [place];  Service: Orthopedics;  Laterality:  Left;  . FOOT AMPUTATION THROUGH ANKLE Left 12/'21/2015   midfoot  . IR GENERIC HISTORICAL Left 05/01/2016   IR THROMBECTOMY AV FISTULA W/THROMBOLYSIS/PTA INC/SHUNT/IMG LEFT 05/01/2016 [name], MD [facility]  . IR GENERIC HISTORICAL  05/01/2016   IR US GUIDE VASC ACCESS LEFT 05/01/2016 [name], MD [facility]  . IR GENERIC HISTORICAL  05/07/2016   IR FLUORO GUIDE CV LINE RIGHT 05/07/2016 Corrie Mckusick, DO [facility]  . IR GENERIC HISTORICAL  05/07/2016   IR US GUIDE VASC ACCESS RIGHT 05/07/2016 Corrie Mckusick, DO [facility]  . IR GENERIC HISTORICAL  05/22/2016   IR US GUIDE VASC ACCESS RIGHT 05/22/2016 Greggory Keen, MD [facility]  . IR GENERIC HISTORICAL  05/22/2016   IR FLUORO GUIDE CV LINE RIGHT 05/22/2016 Greggory Keen, MD [facility]  . IR REMOVAL TUN CV CATH W/O FL  08/21/2016  . PERIPHERAL VASCULAR CATHETERIZATION Left 05/09/2016   Procedure: A/V Fistulagram;  Surgeon: Angelia Mould, MD;  Location: [place] [facility];  Service: Cardiovascular;  Laterality: Left;  arm  . TEE WITHOUT CARDIOVERSION N/A 06/22/2018   Procedure: TRANSESOPHAGEAL ECHOCARDIOGRAM (TEE);  Surgeon: Jerline Pain, MD;  Location: [facility];  Service: Cardiovascular;  Laterality: N/A;     reports that he has never smoked. He has never used smokeless tobacco. He reports that he does not drink alcohol or use drugs.  No Known Allergies  Family History  Problem Relation Age of Onset  . Diabetes Mother   . Diabetes Father     Prior to Admission medications   Medication Sig Start Date End Date Taking? Authorizing Provider  acetaminophen (TYLENOL) 325 MG tablet Take 2 tablets (650 mg total) by mouth every 6 (six) hours as needed for mild pain. Patient not taking: Reported on 09/26/2019 11/24/18   Carmin Muskrat, MD  Amino Acids-Protein Hydrolys (FEEDING SUPPLEMENT, PRO-STAT SUGAR FREE 64,) LIQD Take 30 mLs by mouth 2 (two) times daily. Patient not taking: Reported on 06/09/2019  09/07/18   Lavina Hamman, MD  bictegravir-emtricitabine-tenofovir AF (BIKTARVY) 50-200-25 MG TABS tablet Take 1 tablet by mouth daily. 06/09/19   Truman Hayward, MD  Blood Glucose Monitoring Suppl (TRUE METRIX METER) DEVI 1 each by Does not apply route 3 (three) times daily. Patient not taking: Reported on 06/09/2019 09/15/18   [name], MD  docusate sodium (COLACE) 100 MG capsule Take 1 capsule (100 mg total) by mouth 2 (two) times daily as needed for mild constipation. Patient not taking: Reported on 06/09/2019 09/07/18   Lavina Hamman, MD  glucose blood (TRUE METRIX BLOOD GLUCOSE TEST) test strip 1 each by Other route 3 (three) times daily. 09/15/18   [name], MD  HYDROcodone-acetaminophen (NORCO/VICODIN) 5-325 MG tablet Take 1-2 tablets by mouth every 4 (four) hours as needed. 09/26/19   Lacretia Leigh, MD  insulin NPH-regular Human (70-30) 100 UNIT/ML injection Inject 15 Units into the skin 2 (two) times daily with a meal. 09/28/18   [name], MD  Insulin Syringe-Needle U-100 (BD INSULIN SYRINGE ULTRAFINE) 31G X 15/64" 0.5 ML MISC 1 each by Does not apply  route 2 (two) times daily. Please dispense needles as well. 09/28/18   [name], MD  metaxalone (SKELAXIN) 800 MG tablet Take 1 tablet (800 mg total) by mouth 3 (three) times daily. 09/26/19   Lacretia Leigh, MD  midodrine (PROAMATINE) 10 MG tablet Take 1 tablet (10 mg total) by mouth Every Tuesday,Thursday,and Saturday with dialysis. 09/08/18   Lavina Hamman, MD  Nutritional Supplements (FEEDING SUPPLEMENT, NEPRO CARB STEADY,) LIQD Take 237 mLs by mouth 2 (two) times daily between meals. Patient not taking: Reported on 09/26/2019 09/07/18   Lavina Hamman, MD  ondansetron (ZOFRAN ODT) 4 MG disintegrating tablet Take 1 tablet (4 mg total) by mouth every 8 (eight) hours as needed for nausea or vomiting. 06/21/19   Joy, Shawn C, PA-C  TRUEplus Lancets 28G MISC 1 each by Does not apply route 3 (three) times daily. 09/15/18    [name], MD    Physical Exam: Constitutional: Moderately built and nourished. Vitals:   09/27/19 2330 09/27/19 2345 09/28/19 0000 09/28/19 0030  BP: 96/62 102/61 (!) 82/56 (!) 97/57  Pulse: 92 92 86 87  Resp: 20 12 12 15   Temp:      TempSrc:      SpO2: 97% 95% 95% 93%  Weight:      Height:       Eyes: Anicteric no pallor. ENMT: No discharge from the ears eyes nose or mouth. Neck: No mass felt.  No neck rigidity. Respiratory: No rhonchi or crepitations. Cardiovascular: S1-S2 heard. Abdomen: Soft nontender bowel sounds present. Musculoskeletal: Bilateral BKA.  Pain on moving right upper extremity. Skin: No obvious skin rash. Neurologic: Alert awake oriented time place and person.  Moves all extremities. Psychiatric: Appears normal.   Labs on Admission: I have personally reviewed following labs and imaging studies  CBC: Recent Labs  Lab 09/26/19 2048 09/27/19 1607  WBC 7.7 5.0  NEUTROABS 5.6  --   HGB 11.6* 10.3*  HCT 35.2* 31.0*  MCV 105.4* 103.0*  PLT 180 109*   Basic Metabolic Panel: Recent Labs  Lab 09/27/19 1607  NA 141  K 3.8  CL 95*  CO2 28  GLUCOSE 123*  BUN 22*  CREATININE 7.16*  CALCIUM 8.2*   GFR: Estimated Creatinine Clearance: 8.9 mL/min (A) (by C-G formula based on SCr of 7.16 mg/dL (H)). Liver Function Tests: No results for input(s): AST, ALT, ALKPHOS, BILITOT, PROT, ALBUMIN in the last 168 hours. No results for input(s): LIPASE, AMYLASE in the last 168 hours. No results for input(s): AMMONIA in the last 168 hours. Coagulation Profile: Recent Labs  Lab 09/27/19 1608  INR 1.1   Cardiac Enzymes: No results for input(s): CKTOTAL, CKMB, CKMBINDEX, TROPONINI in the last 168 hours. BNP (last 3 results) No results for input(s): PROBNP in the last 8760 hours. HbA1C: No results for input(s): HGBA1C in the last 72 hours. CBG: No results for input(s): GLUCAP in the last 168 hours. Lipid Profile: No results for input(s): CHOL, HDL,  LDLCALC, TRIG, CHOLHDL, LDLDIRECT in the last 72 hours. Thyroid Function Tests: No results for input(s): TSH, T4TOTAL, FREET4, T3FREE, THYROIDAB in the last 72 hours. Anemia Panel: No results for input(s): VITAMINB12, FOLATE, FERRITIN, TIBC, IRON, RETICCTPCT in the last 72 hours. Urine analysis:    Component Value Date/Time   COLORURINE YELLOW 07/04/2010 1840   APPEARANCEUR TURBID (A) 07/04/2010 1840   LABSPEC 1.013 07/04/2010 1840   PHURINE 7.0 07/04/2010 1840   GLUCOSEU NEG mg/dL 07/04/2010 1840   HGBUR TRACE (A) 03/13/2010 1824  BILIRUBINUR NEG 07/04/2010 1840   KETONESUR NEG mg/dL 07/04/2010 1840   PROTEINUR > 300 mg/dL (A) 07/04/2010 1840   UROBILINOGEN 0.2 07/04/2010 1840   NITRITE NEG 07/04/2010 1840   LEUKOCYTESUR LARGE (A) 07/04/2010 1840   Sepsis Labs: @LABRCNTIP (procalcitonin:4,lacticidven:4) ) Recent Results (from the past 240 hour(s))  SARS Coronavirus 2 by RT PCR (hospital order, performed in [place] hospital lab) Nasopharyngeal Nasopharyngeal Swab     Status: Abnormal   Collection Time: 09/27/19  9:17 PM   Specimen: Nasopharyngeal Swab  Result Value Ref Range Status   SARS Coronavirus 2 POSITIVE (A) NEGATIVE Final    Comment: RESULT CALLED TO, READ BACK BY AND VERIFIED WITH: B BECK RN 09/27/19 2255 JDW (NOTE) SARS-CoV-2 target nucleic acids are DETECTED SARS-CoV-2 RNA is generally detectable in upper respiratory specimens  during the acute phase of infection.  Positive results are indicative  of the presence of the identified virus, but do not rule out bacterial infection or co-infection with other pathogens not detected by the test.  Clinical correlation with patient history and  other diagnostic information is necessary to determine patient infection status.  The expected result is negative. Fact Sheet for Patients:   StrictlyIdeas.no  Fact Sheet for Healthcare Providers:   BankingDealers.co.za   This test  is not yet approved or cleared by the [place] FDA and  has been authorized for detection and/or diagnosis of SARS-CoV-2 by FDA under an Emergency Use Authorization (EUA).  This EUA will remain in effect (meaning this test can be used)  for the duration of  the COVID-19 declaration under Section 564(b)(1) of the Act, 21 U.S.C. section 360-bbb-3(b)(1), unless the authorization is terminated or revoked sooner. Performed at [facility], [place] [address] [place], [place] [address]      Radiological Exams on Admission: CT HEAD WO CONTRAST  Result Date: 09/27/2019 CLINICAL DATA:  Headache EXAM: CT HEAD WITHOUT CONTRAST TECHNIQUE: Contiguous axial images were obtained from the base of the skull through the vertex without intravenous contrast. COMPARISON:  CT brain 06/21/2019 FINDINGS: Brain: No evidence of acute infarction, hemorrhage, hydrocephalus, extra-axial collection or mass lesion/mass effect. Vascular: No hyperdense vessel or unexpected calcification. Skull: Normal. Negative for fracture or focal lesion. Sinuses/Orbits: Mucosal thickening in the ethmoid sinuses. Other: None. IMPRESSION: Negative non contrasted CT appearance of the brain.  Sinus disease. Electronically Signed   By: Donavan Foil M.D.   On: 09/27/2019 19:17   DG Chest Portable 1 View  Result Date: 09/27/2019 CLINICAL DATA:  Chest pain EXAM: PORTABLE CHEST 1 VIEW COMPARISON:  August 27, 2018 FINDINGS: The heart size and mediastinal contours are within normal limits. No new consolidation or edema. No pleural effusion or pneumothorax. No new osseous abnormality. IMPRESSION: No acute process in the chest. Electronically Signed   By: Macy Mis M.D.   On: 09/27/2019 16:26   CT Renal Stone Study  Result Date: 09/26/2019 CLINICAL DATA:  Left flank pain for 3 days, on dialysis EXAM: CT ABDOMEN AND PELVIS WITHOUT CONTRAST TECHNIQUE: Multidetector CT imaging of the abdomen and pelvis was performed following the standard  protocol without IV contrast. COMPARISON:  CT 04/01/2016 FINDINGS: Lower chest: Atelectatic changes present in the lung bases. Normal heart size. No pericardial effusion. Coronary artery calcifications are present. Hepatobiliary: No focal liver abnormality is seen within the limitations of this unenhanced CT. No gallstones, gallbladder wall thickening, or biliary dilatation. Pancreas: Unremarkable. No pancreatic ductal dilatation or surrounding inflammatory changes. Spleen: Normal in size without focal  abnormality. Adrenals/Urinary Tract: Normal adrenal glands. Redemonstration of extensive calcification near the corticomedullary junctions which could reflect some medullary nephrocalcinosis versus urolithiasis. No frank hydronephrosis is seen however there are several hyperattenuating foci along the course of the distal ureter, larger distal calcification measuring up to 3 (5/83, 2/67). Thinner more elongated 2 mm calculus is seen just proximal (2/64, 5/85). No right ureteral calculi. Urinary bladder is largely decompressed at the time of exam and therefore poorly evaluated by CT imaging. No gross bladder abnormality. Stomach/Bowel: Distal esophagus, stomach and duodenal sweep are unremarkable. No small bowel wall thickening or dilatation. No evidence of obstruction. A normal appendix is visualized. Much of the colon is fluid-filled without edematous mural thickening or distension. Vascular/Lymphatic: Atherosclerotic calcifications throughout the abdominal aorta and branch vessels. No aneurysm or ectasia. No enlarged abdominopelvic lymph nodes. Reproductive: The prostate and seminal vesicles are unremarkable. Other: No abdominopelvic free fluid or free gas. No bowel containing hernias. Musculoskeletal: Some focal increased attenuate about the vertebral body endplates may reflect early changes of renal osteodystrophy. No acute osseous abnormality or suspicious osseous lesion. IMPRESSION: 1. There at least 2 small  calculi seen within the distal left ureter measuring up to 3 mm with only mild ureterectasis but no frank hydronephrosis. 2. More diffuse calcification about the corticomedullary junctions could reflect additional nonobstructing nephrolithiasis or medullary nephrocalcinosis. 3. Much of the colon is fluid-filled without edematous mural thickening or distension. Findings are nonspecific but can be seen with diarrheal illness. Correlate with patient's symptoms 4. Coronary artery calcifications. 5. Aortic Atherosclerosis (ICD10-I70.0). 6. 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 Electronically Signed   By: Lovena Le M.D.   On: 09/26/2019 19:53   CT Angio Chest/Abd/Pel for Dissection W and/or W/WO  Result Date: 09/27/2019 CLINICAL DATA:  Left flank pain, hypotensive EXAM: CT ANGIOGRAPHY CHEST, ABDOMEN AND PELVIS TECHNIQUE: Non-contrast CT of the chest was initially obtained. Multidetector CT imaging through the chest, abdomen and pelvis was performed using the standard protocol during bolus administration of intravenous contrast. Multiplanar reconstructed images and MIPs were obtained and reviewed to evaluate the vascular anatomy. CONTRAST:  56mL OMNIPAQUE IOHEXOL 350 MG/ML SOLN COMPARISON:  CT renal colic 71/10/2692 FINDINGS: CTA CHEST FINDINGS Cardiovascular: Noncontrast CT of the chest reveals no abnormal hyperdense mural thickening of the thoracic aorta to suggest intramural hematoma. Postcontrast administration there is satisfactory arterial phase enhancement. The aortic root and ascending aorta is suboptimally assessed given cardiac pulsation artifact. The aorta is normal caliber. No convincing features of dissection flap, minimal aortic injury, or other acute luminal abnormality of the aorta is seen. No periaortic stranding or hemorrhage. Minimal plaque seen within the aortic arch. The left vertebral artery arises directly from the aortic arch. Remaining proximal great vessels are unremarkable aside  from minimal calcific plaque in the right subclavian artery. Central pulmonary arteries are normal caliber. Satisfactory pulmonary arterial opacification no central, lobar or segmental filling defects are identified. Normal heart size. No pericardial effusion. Mediastinum/Nodes: Calcified lymph nodes are present in the right hilar region. May reflect sequela of remote granulomatous disease. No worrisome mediastinal, hilar or axillary adenopathy. No mediastinal fluid or gas. Normal thyroid gland and thoracic inlet. No acute abnormality of the trachea or esophagus. Lungs/Pleura: Scattered multifocal areas of mixed ground-glass and consolidation throughout the lungs with a basilar and peripheral predominance on a background of more dependent atelectatic change. No pneumothorax or effusion. No convincing features of edema. No conspicuous pulmonary nodules or masses. Musculoskeletal: There is a highly comminuted fracture of the  right scapula with separate fracture lines extending throughout the supraspinous and infraspinous fossa and with extension through the medial and lateral borders as well as into the scapular spine near the base of the coracoid which is incompletely imaged on this exam. There is extensive overlying contusive change of the musculature of the posterior shoulder. There is a remote posttraumatic deformity of the distal left clavicle. No other acute or suspicious osseous lesions. Review of the MIP images confirms the above findings. CTA ABDOMEN AND PELVIS FINDINGS VASCULAR Aorta: Atherosclerotic plaque within the normal caliber aorta. No significant stenosis. No evidence of aneurysm, dissection or vasculitis. Celiac: Widely patent ostium. Calcified and noncalcified plaque within the splenic artery with minimal multifocal plaque narrowing but no other significant stenosis. No evidence of aneurysm, dissection or vasculitis. SMA: Normally opacified without evidence of aneurysm, dissection, vasculitis or  significant stenosis. Renals: Paired renal arteries bilaterally. Renal arteries are patent without evidence of aneurysm, dissection, vasculitis or fibromuscular dysplasia. IMA: Mild narrowing at the IMA ostium. More distal vessel is normally opacified without significant stenosis. No evidence of aneurysm, dissection or vasculitis. Inflow: Plaque seen within the right common and internal iliac artery without significant flow-limiting stenosis. No evidence of aneurysm, dissection or vasculitis. Plaque seen throughout the proximal outflow vessels as well without significant stenosis. Veins: No obvious venous abnormality within the limitations of this arterial phase study. Review of the MIP images confirms the above findings. NON-VASCULAR Hepatobiliary: No focal liver abnormality is seen. No visible gallstones or gallbladder wall thickening. No pericholecystic fluid. Mild extrahepatic biliary ductal dilatation with the common bile duct measuring up to 9 mm. No visible intraductal gallstones. Pancreas: Unremarkable. No pancreatic ductal dilatation or surrounding inflammatory changes. Spleen: Normal in size without focal abnormality. Adrenals/Urinary Tract: Normal adrenal glands. Redemonstration of the calcification in the corticomedullary junctions, possibly involving the papilla. Few do appear to reflect collecting system calculi however. Additionally, there is redemonstration of the punctate calcifications in the distal ureter which do not appear to have significantly changed from prior the more distal calcification measures up to 3 mm (7/267) while the more proximal calculus measures 2 mm (7/259). There is a lack of significant urinary tract dilatation which could suggest these to be nonobstructive calculi. No concerning renal masses. No distal calcifications of the right urinary collecting system. Urinary bladder is largely decompressed at the time of exam and therefore poorly evaluated by CT imaging. Mild  perivesicular hazy stranding. No other gross bladder abnormality. Stomach/Bowel: Stomach is within normal limits. Appendix appears normal. Question some mild diffuse bowel wall thickening but without frank distention or features of obstruction. Much of the colon also appears slightly thickened and fluid-filled. Appendix is fluid-filled but otherwise unremarkable without significant periappendiceal stranding. Lymphatic: No suspicious or enlarged lymph nodes in the included lymphatic chains. Reproductive: The prostate and seminal vesicles are unremarkable. Other: No abdominopelvic free air or fluid. Musculoskeletal: No suspicious osseous lesions. Increased attenuation towards the vertebral body endplates may suggest some degree of renal osteodystrophy. Review of the MIP images confirms the above findings. IMPRESSION: 1. No evidence of acute aortic syndrome or other acute luminal abnormality of the aorta. 2. Highly comminuted fracture of the right scapula with separate fracture lines extending throughout the supraspinous and infraspinous fossa as well as into the scapular spine near the base of the coracoid which is incompletely imaged on this exam. Consider further evaluation with dedicated shoulder CT. 3. Scattered multifocal areas of mixed ground-glass and consolidation throughout the lungs with a basilar and  peripheral predominance on a background of more dependent atelectatic change. Findings are favored to represent multifocal pneumonia or aspiration. Potential etiologies include atypical viral such as COVID-19. 4. Mild extrahepatic biliary ductal dilatation with the common bile duct measuring up to 9 mm. No visible intraductal gallstones. Recommend correlation with LFTs and if there is concern for biliary obstruction, right upper quadrant ultrasound could be obtained. 5. Redemonstration of the calcification in the corticomedullary junctions, possibly involving the papilla. While several of these may reflect  nonobstructive calculi. 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 6. Punctate calculi again seen in the distal left ureter without significant migration from the comparison. There is a lack of significant urinary tract dilatation which could suggest these to be only partially obstructive. 7. Some mild diffuse large and small bowel thickening with a fluid-filled colon. Findings are nonspecific, and may represent a diarrheal illness/enterocolitis. These results were called by telephone at the time of interpretation on 09/27/2019 at 9:25 pm to provider [facility] , who verbally acknowledged these results. Electronically Signed   By: Lovena Le M.D.   On: 09/27/2019 21:25    EKG: Independently reviewed.  Normal sinus rhythm with nonspecific ST changes.  Assessment/Plan Principal Problem:   Chest pain Active Problems:   Diabetes mellitus with ESRD (end-stage renal disease) ([place])   ESRD on dialysis ([place])   HIV (human immunodeficiency virus infection) ([place])   Scapular fracture   COVID-19 virus infection    1. Chest pain/right scapular fracture -patient's pain is mostly from right scapula fracture.  Increased on moving his right upper extremity.  Dr. Waylan Rocher was consulted requested sling placement and follow-up with him as outpatient. 2. Pneumonia with history of HIV and COVID-19 being positive at this time started patient on remdesivir patient is nonhypoxic.  Follow inflammatory markers and respiratory status.  Since patient is immunocompromise state with hypotension and previous MRSA bacteremia antibiotics have been started empirically.  Follow cultures. 3. Hypotension is chronic but given the immunocompromise state and we are treating empirically with antibiotic and to be make sure patient is septic.  Follow procalcitonin lactic acid levels. 4. Biliary ductal dilatation seen in the CAT scan for  which I ordered LFTs.  Abdomen appears benign.  Will order a sonogram of the abdomen. 5. HIV last CD4 count in January 2021 was more than 300.  Being followed by Dr. Drucilla Schmidt.  Continue antiretroviral. 6. Diabetes mellitus type 1 on NovoLog 70/30 15 units twice daily.  Follow CBGs closely. 7. Thrombocytopenia appears to be new.  Likely from infectious process.  Follow CBC. 8. Anemia likely from renal disease.  Follow CBC. 9. Bilateral BKA.  Since patient has Covid infection with the hypotensive episodes with pneumonia and previous history of MRSA bacteremia and HIV status immunocompromise status will need close monitoring for any further worsening in inpatient status.   DVT prophylaxis: Heparin.  No other patient has thrombocytopenia. Code Status: Full code. Family Communication: Discussed with patient. Disposition Plan: To be determined. Consults called: None. Admission status: Inpatient.   Rise Patience MD [facility] Pager [phone].  If 7PM-7AM, please contact night-coverage [email or web address] Password [email or web address]  09/28/2019, 1:46 AM

## 2019-09-29 LAB — GLUCOSE, CAPILLARY
Glucose-Capillary: 79 mg/dL (ref 70–99)
Glucose-Capillary: 109 mg/dL — ABNORMAL HIGH (ref 70–99)
Glucose-Capillary: 155 mg/dL — ABNORMAL HIGH (ref 70–99)
Glucose-Capillary: 33 mg/dL — CL (ref 70–99)
Glucose-Capillary: 142 mg/dL — ABNORMAL HIGH (ref 70–99)

## 2019-09-29 LAB — D-DIMER, QUANTITATIVE: D-Dimer, Quant: 1.84 ug/mL-FEU — ABNORMAL HIGH (ref 0.00–0.50)

## 2019-09-29 LAB — CBC
HCT: 30.9 % — ABNORMAL LOW (ref 39.0–52.0)
Hemoglobin: 10.4 g/dL — ABNORMAL LOW (ref 13.0–17.0)
MCH: 34.4 pg — ABNORMAL HIGH (ref 26.0–34.0)
MCHC: 33.7 g/dL (ref 30.0–36.0)
MCV: 102.3 fL — ABNORMAL HIGH (ref 80.0–100.0)
Platelets: 139 10*3/uL — ABNORMAL LOW (ref 150–400)
RBC: 3.02 MIL/uL — ABNORMAL LOW (ref 4.22–5.81)
RDW: 14.4 % (ref 11.5–15.5)
WBC: 4 10*3/uL (ref 4.0–10.5)
nRBC: 0 % (ref 0.0–0.2)

## 2019-09-29 LAB — COMPREHENSIVE METABOLIC PANEL
ALT: 62 U/L — ABNORMAL HIGH (ref 0–44)
AST: 87 U/L — ABNORMAL HIGH (ref 15–41)
Albumin: 2.7 g/dL — ABNORMAL LOW (ref 3.5–5.0)
Alkaline Phosphatase: 182 U/L — ABNORMAL HIGH (ref 38–126)
Anion gap: 18 — ABNORMAL HIGH (ref 5–15)
BUN: 19 mg/dL (ref 6–20)
CO2: 25 mmol/L (ref 22–32)
Calcium: 8.2 mg/dL — ABNORMAL LOW (ref 8.9–10.3)
Chloride: 95 mmol/L — ABNORMAL LOW (ref 98–111)
Creatinine, Ser: 5.54 mg/dL — ABNORMAL HIGH (ref 0.61–1.24)
GFR calc Af Amer: 13 mL/min — ABNORMAL LOW (ref >60)
GFR calc non Af Amer: 11 mL/min — ABNORMAL LOW (ref >60)
Glucose, Bld: 108 mg/dL — ABNORMAL HIGH (ref 70–99)
Potassium: 3.5 mmol/L (ref 3.5–5.1)
Sodium: 138 mmol/L (ref 135–145)
Total Bilirubin: 0.8 mg/dL (ref 0.3–1.2)
Total Protein: 7.5 g/dL (ref 6.5–8.1)

## 2019-09-29 LAB — PHOSPHORUS: Phosphorus: 3.8 mg/dL (ref 2.5–4.6)

## 2019-09-29 LAB — C-REACTIVE PROTEIN: CRP: 12.9 mg/dL — ABNORMAL HIGH (ref <1.0)

## 2019-09-29 LAB — FERRITIN: Ferritin: >7500 ng/mL — ABNORMAL HIGH (ref 24–336)

## 2019-09-29 MED ORDER — POLYVINYL ALCOHOL 1.4 % OP SOLN
1.0000 [drp] | OPHTHALMIC | Status: DC | PRN
  Filled 2019-09-29: qty 15

## 2019-09-29 MED ORDER — MIDODRINE HCL 5 MG PO TABS
ORAL_TABLET | ORAL | Status: AC
  Administered 2019-09-29: 10 mg via ORAL
  Filled 2019-09-29: qty 1

## 2019-09-29 MED ORDER — ACETAMINOPHEN 325 MG PO TABS
ORAL_TABLET | ORAL | Status: AC
  Administered 2019-09-29: 650 mg via ORAL
  Filled 2019-09-29: qty 2

## 2019-09-29 MED ORDER — MIDODRINE HCL 5 MG PO TABS
ORAL_TABLET | ORAL | Status: AC
  Filled 2019-09-29: qty 1

## 2019-09-29 MED ORDER — DOXERCALCIFEROL 4 MCG/2ML IV SOLN
INTRAVENOUS | Status: AC
  Administered 2019-09-29: 7 ug via INTRAVENOUS
  Filled 2019-09-29: qty 4

## 2019-09-29 MED ORDER — HEPARIN SODIUM (PORCINE) 1000 UNIT/ML DIALYSIS: 3000.0000 [IU] | Freq: Once | INTRAMUSCULAR | Status: DC

## 2019-09-29 NOTE — Evaluation (Signed)
Physical Therapy Evaluation Patient Details Name: Alvin Daniels MRN: [id number] DOB: 1971-05-04 Today's Date: 09/29/2019   History of Present Illness  49 y.o. male admitted on 09/27/19 for R sided chest pain.  Pt dx with R scapular fx (NWB in sling) and incidentally COVID 19 PNA, and extrahepetic billiary dialation.  Pt with significant PMH of ESRD on HD MWF, anemia, bil BKA with bil prostheses, HIV, DM, hypotension, back pain, Hepatitis C, left femur IM nail.  Clinical Impression  Limited bed level eval due to pt reporting feeling weak post HD today.  He was able to move about the bed without difficulty, however, I had to reinforce NWB R UE.  This will be difficult to comply as he uses his RW most for mobility.  He reports he uses it more for balance and does not press down hard on it.  I educated him to keep it as light as possible to help his scapula heal.  Tele interpreter used throughout visit.  He reports he will get up and walk with me tomorrow.  VSS on RA throughout session.   PT to follow acutely for deficits listed below.      Follow Up Recommendations Home health PT    Equipment Recommendations  None recommended by PT    Recommendations for Other Services   NA    Precautions / Restrictions Precautions Precautions: Fall Precaution Comments: fell a week PTA sustaining the scapular fx Required Braces or Orthoses: Sling Restrictions RUE Weight Bearing: Non weight bearing      Mobility  Bed Mobility Overal bed mobility: Modified Independent             General bed mobility comments: Pt able to reposition in bed with use of railings.   Transfers                 General transfer comment: Pt declined OOB due to feeling weak after HD today            Pertinent Vitals/Pain Pain Assessment: Faces Faces Pain Scale: Hurts even more Pain Location: R shoulder and left eye/orbit Pain Descriptors / Indicators: Grimacing;Guarding Pain Intervention(s):  Limited activity within patient's tolerance;Monitored during session;Repositioned    Home Living Family/patient expects to be discharged to:: Private residence Living Arrangements: Alone Available Help at Discharge: Friend(s);Available PRN/intermittently Type of Home: Apartment Home Access: Level entry     Home Layout: One level Home Equipment: Walker - 2 wheels;Shower seat;Wheelchair - manual      Prior Function Level of Independence: Independent with assistive device(s)         Comments: uses RW for gait more than WC for mobility.  Takes the bus to HD        Extremity/Trunk Assessment   Upper Extremity Assessment Upper Extremity Assessment: RUE deficits/detail RUE Deficits / Details: Pt with difficulty with shoulder elevation (assisting with his left hand)     Lower Extremity Assessment Lower Extremity Assessment: RLE deficits/detail;LLE deficits/detail RLE Deficits / Details: bil LE BKA LLE Deficits / Details: bil LE BKA    Cervical / Trunk Assessment Cervical / Trunk Assessment: Other exceptions Cervical / Trunk Exceptions: h/o chronic back pain  Communication   Communication: Prefers language other than English  Cognition Arousal/Alertness: Awake/alert Behavior During Therapy: WFL for tasks assessed/performed Overall Cognitive Status: Within Functional Limits for tasks assessed  General Comments General comments (skin integrity, edema, etc.): Pt on RA with all VSS per monitor        Assessment/Plan    PT Assessment Patient needs continued PT services  PT Problem List Decreased strength;Decreased range of motion;Decreased activity tolerance;Decreased balance;Decreased mobility;Decreased knowledge of use of DME;Decreased knowledge of precautions;Pain       PT Treatment Interventions DME instruction;Gait training;Functional mobility training;Therapeutic activities;Therapeutic exercise;Balance  training;Patient/family education;Wheelchair mobility training    PT Goals (Current goals can be found in the Care Plan section)  Acute Rehab PT Goals Patient Stated Goal: to go home PT Goal Formulation: With patient Time For Goal Achievement: 10/13/19 Potential to Achieve Goals: Good    Frequency Min 3X/week           AM-PAC PT "6 Clicks" Mobility  Outcome Measure Help needed turning from your back to your side while in a flat bed without using bedrails?: A Little Help needed moving from lying on your back to sitting on the side of a flat bed without using bedrails?: A Little Help needed moving to and from a bed to a chair (including a wheelchair)?: A Little Help needed standing up from a chair using your arms (e.g., wheelchair or bedside chair)?: A Little Help needed to walk in hospital room?: A Little Help needed climbing 3-5 steps with a railing? : Total 6 Click Score: 16    End of Session Equipment Utilized During Treatment: (video interpreter) Activity Tolerance: Patient limited by fatigue Patient left: in bed;with call bell/phone within reach Nurse Communication: Mobility status;Other (comment)(concern re: L eye/orbit pain only during HD) PT Visit Diagnosis: Muscle weakness (generalized) (M62.81);Difficulty in walking, not elsewhere classified (R26.2);Pain Pain - Right/Left: Right Pain - part of body: Shoulder    Time: 1025-8527 PT Time Calculation (min) (ACUTE ONLY): 22 min   Charges:       Verdene Lennert, PT, DPT  Acute Rehabilitation [phone] pager #[phone] office     PT Evaluation $PT Eval Moderate Complexity: 1 Mod         09/29/2019, 5:00 PM

## 2019-09-29 NOTE — Progress Notes (Signed)
The ortho tech brought up a sling for the pt this evening.  Utilizing the translator, I explained to the pt the reasoning for the application of the sling, but he declined wearing it.  The pt stated that he "was not hurting right now," and therefore "did not need it."  Sling is currently on sink counter.  The pt also questioned his COVID status.  Via the interpretor, I reiterated to the pt that individuals can become infected/infectious for the coronavirus without having symptoms.  The pt then wanted to know why he was even in the hospital at this point.  I explained that when he initially arrived we were concerned about his chest pain, but that we also had to consider his COVID positive status and fractured scapula.  The pt would like to speak with the provider tomorrow concerning his plan of care given that he "feels fine."  All other questions answered.

## 2019-09-29 NOTE — Progress Notes (Signed)
OT Cancellation Note  Patient Details Name: Alvin Daniels MRN: [id number] DOB: April 22, 1971   Cancelled Treatment:    Reason Eval/Treat Not Completed: Patient at procedure or test/ unavailable.  Patient off floor at HD.  Will follow up later today if time allows.  August Luz, OTR/L   Phylliss Bob 09/29/2019, 10:02 AM

## 2019-09-29 NOTE — Progress Notes (Signed)
Pt said that his right arm was hurting him some this morning.  He agreed to wear the sling.  Sling is currently on the pt.

## 2019-09-29 NOTE — Progress Notes (Signed)
PROGRESS NOTE                                                                                                                                                                                                             Patient Demographics:    Alvin Daniels, is a 49 y.o. male, DOB - 1970/07/21, YPP:[id number]  Outpatient Primary MD for the patient is Camillia Herter, NP   Admit date - 09/27/2019   LOS - 2  Chief Complaint  Patient presents with  . Chest Pain  . Back Pain       Brief Narrative: Patient is a 49 y.o. male with PMHx of ESRD on HD MWF, HIV on antiretrovirals, bilateral BKA, DM-2, HTN-who sustained a mechanical fall approximately a week prior to this hospitalization-presented to the ED with right-sided chest pain-he was found to have right scapular fracture, and also found to have COVID-19 pneumonia.  See below for further details.  Significant Events: 5/17>> admit to [facility] for right scapula fracture/Covid pneumonia.  COVID-19 medications: Steroids: 5/17>> Remdesivir: 5/17>>  Antibiotics: Rocephin: 5/17>> 5/18 Zithromax: 5/17>> 5/18  Microbiology data: 5/17: Blood culture>> no growth  Significant studies: 5/17: CT angiogram chest/abdomen/pelvis>> highly comminuted right scapular fracture, scattered multi focal groundglass opacities, mild extrahepatic biliary ductal dilatation.  No evidence of acute aortic syndrome.  DVT prophylaxis: SQ heparin  Procedures: None  Consults: Nephrology    Subjective:   Seen earlier this morning at hemodialysis-some mild cough but denies any shortness of breath.   Assessment  & Plan :    Covid 19 Viral pneumonia: Stable-not hypoxic-CRP still significantly elevated (could be scapular fracture)-continue IV remdesivir-no role for steroids as not hypoxic.  Given risk factors for severe disease-continue with remdesivir for now.    Fever:  afebrile/febrile Prone/Incentive Spirometry: encouraged incentive spirometry use 3-4/hour. O2 requirements:  SpO2: 99 %   COVID-19 Labs: Recent Labs    09/28/19 0037 09/28/19 0641  DDIMER  --  1.54*  FERRITIN 4,051*  --   LDH 173  --   CRP 8.6* 9.2*    No results found for: BNP  Recent Labs  Lab 09/28/19 0037 09/28/19 0641  PROCALCITON 0.29 0.36    Lab Results  Component Value Date   SARSCOV2NAA POSITIVE (A) 09/27/2019    Right scapular fracture: Following a mechanical fall-evaluated by orthopedics-Dr. Landau-recommendations  are for sling management, nonweightbearing to right upper extremity-okay to use wrist/hands.  Follow with orthopedics in 2 weeks.  PT/OT consult pending.  ESRD on HD MWF: Nephrology following and directing care.  HIV: Continue antiretrovirals  Hypotension: Etiology uncertain-no indication of sepsis or blood loss-probably related to?  Narcotics administered in the ED-also seems to be a chronic issue-on midodrine on days of dialysis-continue with midodrine.  He is asymptomatic.  No further work-up required.  Thrombocytopenia: Appears to be mild-likely secondary to COVID-19-follow.  Extrahepatic biliary dilatation: Has transaminitis which could be from COVID-19-alk phos/bilirubin not significantly elevated-RUQ ultrasound without any major abnormalities.  Suspect stable for outpatient follow-up and monitoring.  Anemia: Secondary to chronic disease-ESRD/HIV-stable for follow-up.  No indication of blood loss.  History of bilateral BKA: Ambulates with prosthesis  Insulin-dependent DM-2 (A1c on 09/28/2018 6.8): CBGs stable-continue insulin 70/30 15 units twice daily.  Follow and adjust.   Recent Labs    09/28/19 2100 09/29/19 0628 09/29/19 1145  GLUCAP 109* 79 109*    ABG:    Component Value Date/Time   PHART 7.460 (H) 03/03/2016 1439   PCO2ART 36.9 03/03/2016 1439   PO2ART 34.0 (LL) 03/03/2016 1439   HCO3 26.2 03/03/2016 1439   TCO2 30  05/28/2016 1414   TCO2 30 05/28/2016 1414   O2SAT 69.0 03/03/2016 1439    Vent Settings: N/A  Condition -Stable  Family Communication  : Spoke with patient's friend-Hermina ([phone]) on 5/18-we will update over the next few days  Code Status :  Full Code  Diet :  Diet Order            Diet renal/carb modified with fluid restriction Diet-HS Snack? Nothing; Fluid restriction: 1200 mL Fluid; Room service appropriate? Yes; Fluid consistency: Thin  Diet effective now               Disposition Plan  :   Status is: Inpatient  Remains inpatient appropriate because:Inpatient level of care appropriate due to severity of illness   Dispo: The patient is from: Home              Anticipated d/c is to: Home              Anticipated d/c date is: 3 days              Patient currently is not medically stable to d/c.  Barriers to discharge: Complete 5 days of IV Remdesivir  Antimicorbials  :    Anti-infectives (From admission, onward)   Start     Dose/Rate Route Frequency Ordered Stop   09/29/19 1000  remdesivir 100 mg in sodium chloride 0.9 % 100 mL IVPB     100 mg 200 mL/hr over 30 Minutes Intravenous Daily 09/28/19 0147 10/03/19 0959   09/28/19 2345  cefTRIAXone (ROCEPHIN) 2 g in sodium chloride 0.9 % 100 mL IVPB  Status:  Discontinued     2 g 200 mL/hr over 30 Minutes Intravenous Every 24 hours 09/28/19 0145 09/28/19 1200   09/28/19 1000  bictegravir-emtricitabine-tenofovir AF (BIKTARVY) 50-200-25 MG per tablet 1 tablet     1 tablet Oral Daily 09/28/19 0145     09/28/19 0200  remdesivir 200 mg in sodium chloride 0.9% 250 mL IVPB     200 mg 580 mL/hr over 30 Minutes Intravenous Once 09/28/19 0147 09/28/19 0400   09/27/19 2345  azithromycin (ZITHROMAX) 500 mg in sodium chloride 0.9 % 250 mL IVPB  Status:  Discontinued     500 mg 250  mL/hr over 60 Minutes Intravenous Every 24 hours 09/27/19 2331 09/28/19 1200   09/27/19 2345  cefTRIAXone (ROCEPHIN) 1 g in sodium chloride 0.9 %  100 mL IVPB  Status:  Discontinued     1 g 200 mL/hr over 30 Minutes Intravenous Every 24 hours 09/27/19 2331 09/28/19 0145      Inpatient Medications  Scheduled Meds: . bictegravir-emtricitabine-tenofovir AF  1 tablet Oral Daily  . Chlorhexidine Gluconate Cloth  6 each Topical Q0600  . doxercalciferol  7 mcg Intravenous Q M,W,F-HD  . [START ON 09/30/2019] heparin  3,000 Units Dialysis Once in dialysis  . heparin  5,000 Units Subcutaneous Q8H  . insulin aspart  0-6 Units Subcutaneous TID WC  . insulin aspart protamine- aspart  10 Units Subcutaneous Q breakfast  . insulin aspart protamine- aspart  15 Units Subcutaneous Q supper  . midodrine      . midodrine  5 mg Oral TID AC   Continuous Infusions: . remdesivir 100 mg in NS 100 mL 100 mg (09/29/19 1142)   PRN Meds:.acetaminophen, HYDROcodone-acetaminophen, ondansetron **OR** ondansetron (ZOFRAN) IV   Time Spent in minutes  25  See all Orders from today for further details   Oren Binet M.D on 09/29/2019 at 12:05 PM  To page go to [email or web address] - use universal password  [facility] -  Office  [phone]    Objective:   Vitals:   09/29/19 0930 09/29/19 1000 09/29/19 1030 09/29/19 1100  BP: 116/61 (!) 86/57 127/69 105/63  Pulse: 73 73 75 81  Resp:    15  Temp:    98.4 F (36.9 C)  TempSrc:    Oral  SpO2:    99%  Weight:    52.4 kg  Height:        Wt Readings from Last 3 Encounters:  09/29/19 52.4 kg  09/21/19 56.7 kg  06/09/19 62.1 kg     Intake/Output Summary (Last 24 hours) at 09/29/2019 1205 Last data filed at 09/29/2019 0000 Gross per 24 hour  Intake 3 ml  Output --  Net 3 ml     Physical Exam Gen Exam:Alert awake-not in any distress HEENT:atraumatic, normocephalic Chest: B/L clear to auscultation anteriorly CVS:S1S2 regular Abdomen:soft non tender, non distended Extremities: Bilateral BKA-stumps appear unremarkable. Neurology: Non focal Skin: no rash   Data Review:     CBC Recent Labs  Lab 09/26/19 2048 09/27/19 1607 09/28/19 0417 09/28/19 0628  WBC 7.7 5.0 3.7* 5.0  HGB 11.6* 10.3* 9.4* 9.2*  HCT 35.2* 31.0* 29.1* 28.3*  PLT 180 143* 125* 115*  MCV 105.4* 103.0* 104.7* 104.4*  MCH 34.7* 34.2* 33.8 33.9  MCHC 33.0 33.2 32.3 32.5  RDW 14.4 14.0 14.3 14.2  LYMPHSABS 1.1  --   --  1.0  MONOABS 0.9  --   --  0.4  EOSABS 0.1  --   --  0.0  BASOSABS 0.0  --   --  0.0    Chemistries  Recent Labs  Lab 09/27/19 1607 09/28/19 0417 09/28/19 0628  NA 141  --  140  K 3.8  --  4.5  CL 95*  --  98  CO2 28  --  25  GLUCOSE 123*  --  147*  BUN 22*  --  36*  CREATININE 7.16* 8.81* 9.38*  CALCIUM 8.2*  --  7.8*  AST  --   --  109*  ALT  --   --  52*  ALKPHOS  --   --  156*  BILITOT  --   --  1.3*   ------------------------------------------------------------------------------------------------------------------ Recent Labs    09/28/19 0037  TRIG 193*    Lab Results  Component Value Date   HGBA1C 6.8 09/28/2018   ------------------------------------------------------------------------------------------------------------------ No results for input(s): TSH, T4TOTAL, T3FREE, THYROIDAB in the last 72 hours.  Invalid input(s): FREET3 ------------------------------------------------------------------------------------------------------------------ Recent Labs    09/28/19 0037  FERRITIN 4,051*    Coagulation profile Recent Labs  Lab 09/27/19 1608  INR 1.1    Recent Labs    09/28/19 0641  DDIMER 1.54*    Cardiac Enzymes No results for input(s): CKMB, TROPONINI, MYOGLOBIN in the last 168 hours.  Invalid input(s): CK ------------------------------------------------------------------------------------------------------------------ No results found for: BNP  Micro Results Recent Results (from the past 240 hour(s))  Blood Culture (routine x 2)     Status: None (Preliminary result)   Collection Time: 09/27/19  4:45 PM    Specimen: BLOOD  Result Value Ref Range Status   Specimen Description BLOOD RIGHT ANTECUBITAL  Final   Special Requests   Final    BOTTLES DRAWN AEROBIC AND ANAEROBIC Blood Culture results may not be optimal due to an inadequate volume of blood received in culture bottles   Culture   Final    NO GROWTH 2 DAYS Performed at [facility] [address], [place], [place] [address]    Report Status PENDING  Incomplete  Blood Culture (routine x 2)     Status: None (Preliminary result)   Collection Time: 09/27/19  4:50 PM   Specimen: BLOOD RIGHT HAND  Result Value Ref Range Status   Specimen Description BLOOD RIGHT HAND  Final   Special Requests   Final    BOTTLES DRAWN AEROBIC AND ANAEROBIC Blood Culture adequate volume   Culture   Final    NO GROWTH 2 DAYS Performed at [facility], [place] [address] [place], [place] [address]    Report Status PENDING  Incomplete  SARS Coronavirus 2 by RT PCR (hospital order, performed in [facility] hospital lab) Nasopharyngeal Nasopharyngeal Swab     Status: Abnormal   Collection Time: 09/27/19  9:17 PM   Specimen: Nasopharyngeal Swab  Result Value Ref Range Status   SARS Coronavirus 2 POSITIVE (A) NEGATIVE Final    Comment: RESULT CALLED TO, READ BACK BY AND VERIFIED WITH: B BECK RN 09/27/19 2255 JDW (NOTE) SARS-CoV-2 target nucleic acids are DETECTED SARS-CoV-2 RNA is generally detectable in upper respiratory specimens  during the acute phase of infection.  Positive results are indicative  of the presence of the identified virus, but do not rule out bacterial infection or co-infection with other pathogens not detected by the test.  Clinical correlation with patient history and  other diagnostic information is necessary to determine patient infection status.  The expected result is negative. Fact Sheet for Patients:   StrictlyIdeas.no  Fact Sheet for Healthcare Providers:    BankingDealers.co.za   This test is not yet approved or cleared by the [place] FDA and  has been authorized for detection and/or diagnosis of SARS-CoV-2 by FDA under an Emergency Use Authorization (EUA).  This EUA will remain in effect (meaning this test can be used)  for the duration of  the COVID-19 declaration under Section 564(b)(1) of the Act, 21 U.S.C. section 360-bbb-3(b)(1), unless the authorization is terminated or revoked sooner. Performed at [facility], [place] [address], [place], [place] [address]     Radiology Reports CT HEAD WO CONTRAST  Result Date:  09/27/2019 CLINICAL DATA:  Headache EXAM: CT HEAD WITHOUT CONTRAST TECHNIQUE: Contiguous axial images were obtained from the base of the skull through the vertex without intravenous contrast. COMPARISON:  CT brain 06/21/2019 FINDINGS: Brain: No evidence of acute infarction, hemorrhage, hydrocephalus, extra-axial collection or mass lesion/mass effect. Vascular: No hyperdense vessel or unexpected calcification. Skull: Normal. Negative for fracture or focal lesion. Sinuses/Orbits: Mucosal thickening in the ethmoid sinuses. Other: None. IMPRESSION: Negative non contrasted CT appearance of the brain.  Sinus disease. Electronically Signed   By: Donavan Foil M.D.   On: 09/27/2019 19:17   US Abdomen Complete  Result Date: 09/28/2019 CLINICAL DATA:  Acute generalized abdominal pain. EXAM: ABDOMEN ULTRASOUND COMPLETE COMPARISON:  Sep 26, 2019.  April 20, 2014. FINDINGS: Gallbladder: No gallstones or wall thickening visualized. No sonographic Murphy sign noted by sonographer. Common bile duct: Diameter: 13 mm which is unchanged compared to prior exam. No common bile duct stone is noted. Liver: No focal lesion identified. Within normal limits in parenchymal echogenicity. Portal vein is patent on color Doppler imaging with normal direction of blood flow towards the liver. IVC: No abnormality visualized. Pancreas:  Visualized portion unremarkable. Spleen: Size and appearance within normal limits. Right Kidney: Length: 8.9 cm. 9 mm nonobstructive calculus seen in midpole of right kidney. Echogenicity within normal limits. No mass or hydronephrosis visualized. Left Kidney: Length: 9.7 cm. 1.4 cm nonobstructive calculus seen in lower pole. Echogenicity within normal limits. No mass or hydronephrosis visualized. Abdominal aorta: No aneurysm visualized. Other findings: None. IMPRESSION: Bilateral nonobstructive nephrolithiasis. No hydronephrosis or renal obstruction is noted. Stable dilatation of common bile duct which is unchanged compared to prior exams. No definite evidence of calculus is noted. Electronically Signed   By: Marijo Conception M.D.   On: 09/28/2019 09:20   DG Chest Portable 1 View  Result Date: 09/27/2019 CLINICAL DATA:  Chest pain EXAM: PORTABLE CHEST 1 VIEW COMPARISON:  August 27, 2018 FINDINGS: The heart size and mediastinal contours are within normal limits. No new consolidation or edema. No pleural effusion or pneumothorax. No new osseous abnormality. IMPRESSION: No acute process in the chest. Electronically Signed   By: Macy Mis M.D.   On: 09/27/2019 16:26   CT Renal Stone Study  Result Date: 09/26/2019 CLINICAL DATA:  Left flank pain for 3 days, on dialysis EXAM: CT ABDOMEN AND PELVIS WITHOUT CONTRAST TECHNIQUE: Multidetector CT imaging of the abdomen and pelvis was performed following the standard protocol without IV contrast. COMPARISON:  CT 04/01/2016 FINDINGS: Lower chest: Atelectatic changes present in the lung bases. Normal heart size. No pericardial effusion. Coronary artery calcifications are present. Hepatobiliary: No focal liver abnormality is seen within the limitations of this unenhanced CT. No gallstones, gallbladder wall thickening, or biliary dilatation. Pancreas: Unremarkable. No pancreatic ductal dilatation or surrounding inflammatory changes. Spleen: Normal in size without focal  abnormality. Adrenals/Urinary Tract: Normal adrenal glands. Redemonstration of extensive calcification near the corticomedullary junctions which could reflect some medullary nephrocalcinosis versus urolithiasis. No frank hydronephrosis is seen however there are several hyperattenuating foci along the course of the distal ureter, larger distal calcification measuring up to 3 (5/83, 2/67). Thinner more elongated 2 mm calculus is seen just proximal (2/64, 5/85). No right ureteral calculi. Urinary bladder is largely decompressed at the time of exam and therefore poorly evaluated by CT imaging. No gross bladder abnormality. Stomach/Bowel: Distal esophagus, stomach and duodenal sweep are unremarkable. No small bowel wall thickening or dilatation. No evidence of obstruction. A normal appendix is visualized. Much  of the colon is fluid-filled without edematous mural thickening or distension. Vascular/Lymphatic: Atherosclerotic calcifications throughout the abdominal aorta and branch vessels. No aneurysm or ectasia. No enlarged abdominopelvic lymph nodes. Reproductive: The prostate and seminal vesicles are unremarkable. Other: No abdominopelvic free fluid or free gas. No bowel containing hernias. Musculoskeletal: Some focal increased attenuate about the vertebral body endplates may reflect early changes of renal osteodystrophy. No acute osseous abnormality or suspicious osseous lesion. IMPRESSION: 1. There at least 2 small calculi seen within the distal left ureter measuring up to 3 mm with only mild ureterectasis but no frank hydronephrosis. 2. More diffuse calcification about the corticomedullary junctions could reflect additional nonobstructing nephrolithiasis or medullary nephrocalcinosis. 3. Much of the colon is fluid-filled without edematous mural thickening or distension. Findings are nonspecific but can be seen with diarrheal illness. Correlate with patient's symptoms 4. Coronary artery calcifications. 5. Aortic  Atherosclerosis (ICD10-I70.0). 6. 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 Electronically Signed   By: Lovena Le M.D.   On: 09/26/2019 19:53   CT Angio Chest/Abd/Pel for Dissection W and/or W/WO  Result Date: 09/27/2019 CLINICAL DATA:  Left flank pain, hypotensive EXAM: CT ANGIOGRAPHY CHEST, ABDOMEN AND PELVIS TECHNIQUE: Non-contrast CT of the chest was initially obtained. Multidetector CT imaging through the chest, abdomen and pelvis was performed using the standard protocol during bolus administration of intravenous contrast. Multiplanar reconstructed images and MIPs were obtained and reviewed to evaluate the vascular anatomy. CONTRAST:  66m OMNIPAQUE IOHEXOL 350 MG/ML SOLN COMPARISON:  CT renal colic 070/[id number]/0093FINDINGS: CTA CHEST FINDINGS Cardiovascular: Noncontrast CT of the chest reveals no abnormal hyperdense mural thickening of the thoracic aorta to suggest intramural hematoma. Postcontrast administration there is satisfactory arterial phase enhancement. The aortic root and ascending aorta is suboptimally assessed given cardiac pulsation artifact. The aorta is normal caliber. No convincing features of dissection flap, minimal aortic injury, or other acute luminal abnormality of the aorta is seen. No periaortic stranding or hemorrhage. Minimal plaque seen within the aortic arch. The left vertebral artery arises directly from the aortic arch. Remaining proximal great vessels are unremarkable aside from minimal calcific plaque in the right subclavian artery. Central pulmonary arteries are normal caliber. Satisfactory pulmonary arterial opacification no central, lobar or segmental filling defects are identified. Normal heart size. No pericardial effusion. Mediastinum/Nodes: Calcified lymph nodes are present in the right hilar region. May reflect sequela of remote granulomatous disease. No worrisome mediastinal, hilar or axillary adenopathy. No mediastinal fluid or gas. Normal thyroid gland and  thoracic inlet. No acute abnormality of the trachea or esophagus. Lungs/Pleura: Scattered multifocal areas of mixed ground-glass and consolidation throughout the lungs with a basilar and peripheral predominance on a background of more dependent atelectatic change. No pneumothorax or effusion. No convincing features of edema. No conspicuous pulmonary nodules or masses. Musculoskeletal: There is a highly comminuted fracture of the right scapula with separate fracture lines extending throughout the supraspinous and infraspinous fossa and with extension through the medial and lateral borders as well as into the scapular spine near the base of the coracoid which is incompletely imaged on this exam. There is extensive overlying contusive change of the musculature of the posterior shoulder. There is a remote posttraumatic deformity of the distal left clavicle. No other acute or suspicious osseous lesions. Review of the MIP images confirms the above findings. CTA ABDOMEN AND PELVIS FINDINGS VASCULAR Aorta: Atherosclerotic plaque within the normal caliber aorta. No significant stenosis. No evidence of aneurysm, dissection or vasculitis. Celiac: Widely patent ostium. Calcified and  noncalcified plaque within the splenic artery with minimal multifocal plaque narrowing but no other significant stenosis. No evidence of aneurysm, dissection or vasculitis. SMA: Normally opacified without evidence of aneurysm, dissection, vasculitis or significant stenosis. Renals: Paired renal arteries bilaterally. Renal arteries are patent without evidence of aneurysm, dissection, vasculitis or fibromuscular dysplasia. IMA: Mild narrowing at the IMA ostium. More distal vessel is normally opacified without significant stenosis. No evidence of aneurysm, dissection or vasculitis. Inflow: Plaque seen within the right common and internal iliac artery without significant flow-limiting stenosis. No evidence of aneurysm, dissection or vasculitis. Plaque  seen throughout the proximal outflow vessels as well without significant stenosis. Veins: No obvious venous abnormality within the limitations of this arterial phase study. Review of the MIP images confirms the above findings. NON-VASCULAR Hepatobiliary: No focal liver abnormality is seen. No visible gallstones or gallbladder wall thickening. No pericholecystic fluid. Mild extrahepatic biliary ductal dilatation with the common bile duct measuring up to 9 mm. No visible intraductal gallstones. Pancreas: Unremarkable. No pancreatic ductal dilatation or surrounding inflammatory changes. Spleen: Normal in size without focal abnormality. Adrenals/Urinary Tract: Normal adrenal glands. Redemonstration of the calcification in the corticomedullary junctions, possibly involving the papilla. Few do appear to reflect collecting system calculi however. Additionally, there is redemonstration of the punctate calcifications in the distal ureter which do not appear to have significantly changed from prior the more distal calcification measures up to 3 mm (7/267) while the more proximal calculus measures 2 mm (7/259). There is a lack of significant urinary tract dilatation which could suggest these to be nonobstructive calculi. No concerning renal masses. No distal calcifications of the right urinary collecting system. Urinary bladder is largely decompressed at the time of exam and therefore poorly evaluated by CT imaging. Mild perivesicular hazy stranding. No other gross bladder abnormality. Stomach/Bowel: Stomach is within normal limits. Appendix appears normal. Question some mild diffuse bowel wall thickening but without frank distention or features of obstruction. Much of the colon also appears slightly thickened and fluid-filled. Appendix is fluid-filled but otherwise unremarkable without significant periappendiceal stranding. Lymphatic: No suspicious or enlarged lymph nodes in the included lymphatic chains. Reproductive: The  prostate and seminal vesicles are unremarkable. Other: No abdominopelvic free air or fluid. Musculoskeletal: No suspicious osseous lesions. Increased attenuation towards the vertebral body endplates may suggest some degree of renal osteodystrophy. Review of the MIP images confirms the above findings. IMPRESSION: 1. No evidence of acute aortic syndrome or other acute luminal abnormality of the aorta. 2. Highly comminuted fracture of the right scapula with separate fracture lines extending throughout the supraspinous and infraspinous fossa as well as into the scapular spine near the base of the coracoid which is incompletely imaged on this exam. Consider further evaluation with dedicated shoulder CT. 3. Scattered multifocal areas of mixed ground-glass and consolidation throughout the lungs with a basilar and peripheral predominance on a background of more dependent atelectatic change. Findings are favored to represent multifocal pneumonia or aspiration. Potential etiologies include atypical viral such as COVID-19. 4. Mild extrahepatic biliary ductal dilatation with the common bile duct measuring up to 9 mm. No visible intraductal gallstones. Recommend correlation with LFTs and if there is concern for biliary obstruction, right upper quadrant ultrasound could be obtained. 5. Redemonstration of the calcification in the corticomedullary junctions, possibly involving the papilla. While several of these may reflect nonobstructive calculi. 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 6. Punctate calculi again seen in the distal  left ureter without significant migration from the comparison. There is a lack of significant urinary tract dilatation which could suggest these to be only partially obstructive. 7. Some mild diffuse large and small bowel thickening with a fluid-filled colon. Findings are nonspecific, and may  represent a diarrheal illness/enterocolitis. These results were called by telephone at the time of interpretation on 09/27/2019 at 9:25 pm to provider [facility] , who verbally acknowledged these results. Electronically Signed   By: Lovena Le M.D.   On: 09/27/2019 21:25

## 2019-09-29 NOTE — Progress Notes (Signed)
[facility] Progress Note  Subjective:  Patient not examined today directly given COVID-19 + status, utilizing data taken from chart +/- discussions w/ providers and staff.    Vitals:   09/29/19 0930 09/29/19 1000 09/29/19 1030 09/29/19 1100  BP: 116/61 (!) 86/57 127/69 105/63  Pulse: 73 73 75 81  Resp:    15  Temp:    98.4 F (36.9 C)  TempSrc:    Oral  SpO2:    99%  Weight:    52.4 kg  Height:        Exam: Gen alert, no distress, calm No jvd or bruits Chest clear bilat to bases RRR no MRG Abd soft ntnd no mass or ascites +bs Ext R BKA no edema, no LLE edema Neuro is alert, Ox 3 , nf LUE AVF+bruit   Home meds:  - biktarvy 1 qd  - insulin  70-30 15u bid  - vicodin prn/ skelaxin 800 tid/ zofran prn  - midodrine 10mg  preHD TTS  - prn's/ vitamins/ supplements    OP HD: MWF [facility]   4h  54.5kg  400/1.5  2/2 bath  LUE AVF  Hep 2000    hect 7  mirc 60 q 2 last 4/14  full HD 5/17   Assessment/ Plan: 1. COVID +PNA - w/ + CT changes and low grade fevers. Pt not sig symptomatic. Started on IV remdesivir for high risk of deterioration and IV abx for bact PNA.  2. ESRD - on HD MWF. HD today.   3. BP/volume - 2kg under dry wt, no vol excess on exam. Chronic hypotension on midodrine pre HD at home, getting 5 tid here for soft BP's. Min UF w HD.   4. HIV - on ART 5. DM - on insulin 6. Anemia ckd - Hb 9.2, last esa in early April. Restart if needed. Check Fe stores.  7. MBD ckd - Ca ok, cont IV hect, can't afford binder, using samples last was fosrenol 1 gm tid. Check phos.        Alvin Daniels 09/29/2019, 2:32 PM   Recent Labs  Lab 09/28/19 0628 09/29/19 1257  K 4.5 3.5  BUN 36* 19  CREATININE 9.38* 5.54*  CALCIUM 7.8* 8.2*  HGB 9.2* 10.4*   Inpatient medications: . bictegravir-emtricitabine-tenofovir AF  1 tablet Oral Daily  . Chlorhexidine Gluconate Cloth  6 each Topical Q0600  . doxercalciferol  7 mcg Intravenous Q M,W,F-HD  . [START ON  09/30/2019] heparin  3,000 Units Dialysis Once in dialysis  . heparin  5,000 Units Subcutaneous Q8H  . insulin aspart  0-6 Units Subcutaneous TID WC  . insulin aspart protamine- aspart  10 Units Subcutaneous Q breakfast  . insulin aspart protamine- aspart  15 Units Subcutaneous Q supper  . midodrine      . midodrine  5 mg Oral TID AC   . remdesivir 100 mg in NS 100 mL 100 mg (09/29/19 1142)   acetaminophen, HYDROcodone-acetaminophen, ondansetron **OR** ondansetron (ZOFRAN) IV, polyvinyl alcohol

## 2019-09-29 NOTE — Progress Notes (Signed)
Hypoglycemic Event  CBG: 33 Treatment: 8 oz juice/soda  Symptoms: None  Follow-up CBG: Time:2311  CBG Result:142  Possible Reasons for Event: Inadequate meal intake  Comments/MD notified:na    Alvin Daniels

## 2019-09-30 LAB — MRSA PCR SCREENING: MRSA by PCR: POSITIVE — AB

## 2019-09-30 LAB — COMPREHENSIVE METABOLIC PANEL
ALT: 57 U/L — ABNORMAL HIGH (ref 0–44)
AST: 85 U/L — ABNORMAL HIGH (ref 15–41)
Albumin: 2.7 g/dL — ABNORMAL LOW (ref 3.5–5.0)
Alkaline Phosphatase: 186 U/L — ABNORMAL HIGH (ref 38–126)
Anion gap: 16 — ABNORMAL HIGH (ref 5–15)
BUN: 33 mg/dL — ABNORMAL HIGH (ref 6–20)
CO2: 25 mmol/L (ref 22–32)
Calcium: 8.5 mg/dL — ABNORMAL LOW (ref 8.9–10.3)
Chloride: 95 mmol/L — ABNORMAL LOW (ref 98–111)
Creatinine, Ser: 8.6 mg/dL — ABNORMAL HIGH (ref 0.61–1.24)
GFR calc Af Amer: 8 mL/min — ABNORMAL LOW (ref >60)
GFR calc non Af Amer: 7 mL/min — ABNORMAL LOW (ref >60)
Glucose, Bld: 145 mg/dL — ABNORMAL HIGH (ref 70–99)
Potassium: 4 mmol/L (ref 3.5–5.1)
Sodium: 136 mmol/L (ref 135–145)
Total Bilirubin: 0.6 mg/dL (ref 0.3–1.2)
Total Protein: 7.9 g/dL (ref 6.5–8.1)

## 2019-09-30 LAB — D-DIMER, QUANTITATIVE: D-Dimer, Quant: 1.4 ug/mL-FEU — ABNORMAL HIGH (ref 0.00–0.50)

## 2019-09-30 LAB — CBC
HCT: 31.7 % — ABNORMAL LOW (ref 39.0–52.0)
Hemoglobin: 10.6 g/dL — ABNORMAL LOW (ref 13.0–17.0)
MCH: 34 pg (ref 26.0–34.0)
MCHC: 33.4 g/dL (ref 30.0–36.0)
MCV: 101.6 fL — ABNORMAL HIGH (ref 80.0–100.0)
Platelets: 147 10*3/uL — ABNORMAL LOW (ref 150–400)
RBC: 3.12 MIL/uL — ABNORMAL LOW (ref 4.22–5.81)
RDW: 14.4 % (ref 11.5–15.5)
WBC: 4.1 10*3/uL (ref 4.0–10.5)
nRBC: 0 % (ref 0.0–0.2)

## 2019-09-30 LAB — IRON AND TIBC
Iron: 156 ug/dL (ref 45–182)
Saturation Ratios: 84 % — ABNORMAL HIGH (ref 17.9–39.5)
TIBC: 186 ug/dL — ABNORMAL LOW (ref 250–450)
UIBC: 30 ug/dL

## 2019-09-30 LAB — GLUCOSE, CAPILLARY
Glucose-Capillary: 28 mg/dL — CL (ref 70–99)
Glucose-Capillary: 139 mg/dL — ABNORMAL HIGH (ref 70–99)
Glucose-Capillary: 122 mg/dL — ABNORMAL HIGH (ref 70–99)
Glucose-Capillary: 123 mg/dL — ABNORMAL HIGH (ref 70–99)
Glucose-Capillary: 155 mg/dL — ABNORMAL HIGH (ref 70–99)

## 2019-09-30 LAB — C-REACTIVE PROTEIN: CRP: 12 mg/dL — ABNORMAL HIGH (ref <1.0)

## 2019-09-30 LAB — FERRITIN: Ferritin: >7500 ng/mL — ABNORMAL HIGH (ref 24–336)

## 2019-09-30 MED ORDER — ALUM & MAG HYDROXIDE-SIMETH 200-200-20 MG/5ML PO SUSP
15.0000 mL | Freq: Once | ORAL | Status: AC | PRN
  Administered 2019-09-30: 15 mL via ORAL
  Filled 2019-09-30: qty 30

## 2019-09-30 MED ORDER — CHLORHEXIDINE GLUCONATE CLOTH 2 % EX PADS
6.0000 | MEDICATED_PAD | Freq: Every day | CUTANEOUS | Status: DC
  Administered 2019-09-30 – 2019-10-02 (×3): 6 via TOPICAL

## 2019-09-30 MED ORDER — INSULIN GLARGINE 100 UNIT/ML ~~LOC~~ SOLN
12.0000 [IU] | Freq: Every day | SUBCUTANEOUS | Status: DC
  Administered 2019-09-30 – 2019-10-02 (×3): 12 [IU] via SUBCUTANEOUS
  Filled 2019-09-30 (×3): qty 0.12

## 2019-09-30 MED ORDER — FAMOTIDINE 20 MG PO TABS
20.0000 mg | ORAL_TABLET | Freq: Every day | ORAL | Status: DC
  Administered 2019-09-30 – 2019-10-02 (×3): 20 mg via ORAL
  Filled 2019-09-30 (×3): qty 1

## 2019-09-30 MED ORDER — MUPIROCIN 2 % EX OINT
1.0000 "application " | TOPICAL_OINTMENT | Freq: Two times a day (BID) | CUTANEOUS | Status: DC
  Administered 2019-09-30 – 2019-10-02 (×6): 1 via NASAL
  Filled 2019-09-30 (×2): qty 22

## 2019-09-30 MED ORDER — ALUM & MAG HYDROXIDE-SIMETH 200-200-20 MG/5ML PO SUSP: 30.0000 mL | ORAL | Status: DC | PRN

## 2019-09-30 NOTE — Progress Notes (Signed)
Occupational Therapy Evaluation Patient Details Name: Alvin Daniels MRN: [id number] DOB: 03/14/1971 Today's Date: 09/30/2019    History of Present Illness 49 y.o. male admitted on 09/27/19 for R sided chest pain.  Pt dx with R scapular fx (NWB in sling) and incidentally COVID 19 PNA, and extrahepetic billiary dialation.  Pt with significant PMH of ESRD on HD MWF, anemia, bil BKA with bil prostheses, HIV, DM, hypotension, back pain, Hepatitis C, left femur IM nail.   Clinical Impression   Patient lives at home and is typically independent with assistive devices.  He gets help from others with IALDs like cooking/cleaning.  Patient is primarily Alvin Daniels speaking and needs reinforcement that things are going to be trickier at home as he cannot put weight through his R UE.  Patient demonstrates good bed mobility and seated balance, though standing and transfers not evaled today as patient was nauseous.  Will follow with OT acutely to practice transfers, donning/doffing prosthesis and sling, and others ADLs while maintaining weight bearing orders.    Follow Up Recommendations  Home health OT    Equipment Recommendations  3 in 1 bedside commode    Recommendations for Other Services       Precautions / Restrictions Precautions Precautions: Fall Precaution Comments: fell a week PTA sustaining the scapular fx Restrictions Weight Bearing Restrictions: Yes RUE Weight Bearing: Non weight bearing      Mobility Bed Mobility Overal bed mobility: Modified Independent             General bed mobility comments: Pt able to reposition in bed with use of railings.   Transfers                 General transfer comment: Declined OOB    Balance                                           ADL either performed or assessed with clinical judgement   ADL Overall ADL's : Needs assistance/impaired Eating/Feeding: Set up;Sitting   Grooming: Set up;Sitting   Upper  Body Bathing: Minimal assistance;Sitting   Lower Body Bathing: Minimal assistance;Sitting/lateral leans   Upper Body Dressing : Minimal assistance;Sitting   Lower Body Dressing: Sitting/lateral leans;Minimal assistance                 General ADL Comments: Patient declining to attempt transfers today as he was feeling nauseous andhad headache     Vision         Perception     Praxis      Pertinent Vitals/Pain Pain Assessment: Faces Faces Pain Scale: Hurts little more Pain Location: R shoulder. Headache Pain Descriptors / Indicators: Dull;Discomfort Pain Intervention(s): Limited activity within patient's tolerance;Monitored during session     Hand Dominance Right   Extremity/Trunk Assessment Upper Extremity Assessment Upper Extremity Assessment: Overall WFL for tasks assessed;RUE deficits/detail RUE Deficits / Details: NWB. Good mobility with elbow/hand/wrist. Difficulty with shoulder elevation RUE Coordination: decreased gross motor   Lower Extremity Assessment RLE Deficits / Details: bil LE BKA LLE Deficits / Details: bil LE BKA       Communication Communication Communication: Prefers language other than English   Cognition Arousal/Alertness: Awake/alert Behavior During Therapy: WFL for tasks assessed/performed Overall Cognitive Status: Within Functional Limits for tasks assessed  General Comments: Patient had misunderstood many questions and did not answer them correctly, but have been due to language barrier/interpretor    General Comments       Exercises     Shoulder Instructions      Home Living Family/patient expects to be discharged to:: Private residence Living Arrangements: Alone Available Help at Discharge: Friend(s);Available PRN/intermittently Type of Home: Apartment Home Access: Level entry     Home Layout: One level     Bathroom Shower/Tub: Astronomer  Accessibility: Yes   Home Equipment: Environmental consultant - 2 wheels;Shower seat;Wheelchair - manual          Prior Functioning/Environment Level of Independence: Independent with assistive device(s)        Comments: Patient gets assistance from friends with IALDs like cooking/cleaning. uses RW for gait more than WC for mobility.  Takes the bus to HD        OT Problem List: Decreased range of motion;Impaired UE functional use;Pain      OT Treatment/Interventions: Self-care/ADL training;Therapeutic exercise;Therapeutic activities;Patient/family education    OT Goals(Current goals can be found in the care plan section) Acute Rehab OT Goals Patient Stated Goal: to go home OT Goal Formulation: With patient Time For Goal Achievement: 10/14/19 Potential to Achieve Goals: Good  OT Frequency: Min 2X/week   Barriers to D/C: Decreased caregiver support          Co-evaluation              AM-PAC OT "6 Clicks" Daily Activity     Outcome Measure Help from another person eating meals?: A Little Help from another person taking care of personal grooming?: A Little Help from another person toileting, which includes using toliet, bedpan, or urinal?: A Little Help from another person bathing (including washing, rinsing, drying)?: A Little Help from another person to put on and taking off regular upper body clothing?: A Little Help from another person to put on and taking off regular lower body clothing?: A Little 6 Click Score: 18   End of Session Nurse Communication: Mobility status;Other (comment)(nausea meds)  Activity Tolerance: Patient limited by pain;Treatment limited secondary to medical complications (Comment)(nauseous) Patient left: in bed;with call bell/phone within reach  OT Visit Diagnosis: History of falling (Z91.81);Pain Pain - Right/Left: Right Pain - part of body: Shoulder                Time: 4098-1191 OT Time Calculation (min): 24 min Charges:  OT General Charges $OT  Visit: 1 Visit OT Evaluation $OT Eval Moderate Complexity: 1 Mod OT Treatments $Self Care/Home Management : 8-22 mins  August Luz, OTR/L   Phylliss Bob 09/30/2019, 4:45 PM

## 2019-09-30 NOTE — Progress Notes (Signed)
Alvin Daniels Progress Note  Subjective:  Pt seen in room, no c/o today. Wants to know when he is going home.   Vitals:   09/29/19 1446 09/29/19 2212 09/30/19 0403 09/30/19 0405  BP: 105/78 (!) 117/52 118/67   Pulse: 81 80 79   Resp: 16 18 16    Temp: 99 F (37.2 C) 98.2 F (36.8 C) 97.8 F (36.6 C)   TempSrc: Oral Oral Oral   SpO2: 97% 98% 100%   Weight:    52.1 kg  Height:        Exam: Gen alert, no distress No jvd  Chest clear bilat RRR no MRG Abd soft ntnd no mass or ascites +bs Ext R BKA no edema, no LLE edema Neuro is alert, Ox 3 , nf LUE AVF+bruit   Home meds:  - biktarvy 1 qd  - insulin  70-30 15u bid  - vicodin prn/ skelaxin 800 tid/ zofran prn  - midodrine 10mg  preHD TTS  - prn's/ vitamins/ supplements    OP HD: MWF [facility]   4h  54.5kg  400/1.5  2/2 bath  LUE AVF  Hep 2000    hect 7  mirc 60 q 2 last 4/14  full HD 5/17   Assessment/ Plan: 1. COVID +PNA - w/ + CT changes and low grade fevers. Pt not sig symptomatic. Started on IV remdesivir for high risk of deterioration and IV abx for possible bact PNA.  2. ESRD - on HD MWF. HD Friday.  3. BP/volume - 2kg under dry wt, no vol excess on exam. Chronic hypotension on midodrine pre HD at home, getting 5 tid here for soft BP's. Min UF w HD.  Keep even on HD Friday 4. HIV - on ART 5. DM - on insulin 6. Anemia ckd - Hb 9.2, last esa in early April. Restart if needed. Tsat today is 84%, no need for IV Fe.  7. MBD ckd - Ca ok, cont IV hect. Using samples for binders at home, last medication was fosrenol 1 gm tid. Phos good 3.8    Rob Tharun Cappella 09/30/2019, 10:24 AM   Recent Labs  Lab 09/29/19 1257 09/29/19 1443 09/30/19 0616  K 3.5  --  4.0  BUN 19  --  33*  CREATININE 5.54*  --  8.60*  CALCIUM 8.2*  --  8.5*  PHOS  --  3.8  --   HGB 10.4*  --  10.6*   Inpatient medications: . bictegravir-emtricitabine-tenofovir AF  1 tablet Oral Daily  . Chlorhexidine Gluconate Cloth  6 each  Topical Q0600  . doxercalciferol  7 mcg Intravenous Q M,W,F-HD  . heparin  3,000 Units Dialysis Once in dialysis  . heparin  5,000 Units Subcutaneous Q8H  . insulin aspart  0-6 Units Subcutaneous TID WC  . insulin glargine  12 Units Subcutaneous Daily  . midodrine  5 mg Oral TID AC  . mupirocin ointment  1 application Nasal BID   . remdesivir 100 mg in NS 100 mL 100 mg (09/30/19 1001)   acetaminophen, HYDROcodone-acetaminophen, ondansetron **OR** ondansetron (ZOFRAN) IV, polyvinyl alcohol

## 2019-09-30 NOTE — Progress Notes (Signed)
Pt c/o abd pain 4/10. Gave Tylenol. Then c/o feeling nauseous, gave Zofran. Pt still c/o abd discomfort, mostly when eating. Made MD aware. Orders given for Pepcid.

## 2019-09-30 NOTE — Progress Notes (Addendum)
PROGRESS NOTE                                                                                                                                                                                                             Patient Demographics:    Alvin Daniels, is a 49 y.o. male, DOB - 02/15/1971, RFX:[id number]  Outpatient Primary MD for the patient is Camillia Herter, NP   Admit date - 09/27/2019   LOS - 3  Chief Complaint  Patient presents with  . Chest Pain  . Back Pain       Brief Narrative: Patient is a 49 y.o. male with PMHx of ESRD on HD MWF, HIV on antiretrovirals, bilateral BKA, DM-2, HTN-who sustained a mechanical fall approximately a week prior to this hospitalization-presented to the ED with right-sided chest pain-he was found to have right scapular fracture, and also found to have COVID-19 pneumonia.  See below for further details.  Significant Events: 5/17>> admit to [facility] for right scapula fracture/Covid pneumonia.  COVID-19 medications: Remdesivir: 5/17>>  Antibiotics: Rocephin: 5/17>> 5/18 Zithromax: 5/17>> 5/18  Microbiology data: 5/17: Blood culture>> no growth  Significant studies: 5/17: CT angiogram chest/abdomen/pelvis>> highly comminuted right scapular fracture, scattered multi focal groundglass opacities, mild extrahepatic biliary ductal dilatation.  No evidence of acute aortic syndrome.  DVT prophylaxis: SQ heparin  Procedures: None  Consults: Nephrology    Subjective:   Seen earlier this morning at hemodialysis-some mild cough but denies any shortness of breath.   Assessment  & Plan :    Covid 19 Viral pneumonia: Stable-not hypoxic-CRP still significantly elevated (could be scapular fracture)-continue IV remdesivir-no role for steroids as not hypoxic.  Given risk factors for severe disease-continue with remdesivir for now.    Fever: afebrile/febrile Prone/Incentive  Spirometry: encouraged incentive spirometry use 3-4/hour. O2 requirements:  SpO2: 100 %   COVID-19 Labs: Recent Labs    09/28/19 0037 09/28/19 0037 09/28/19 0641 09/29/19 1257 09/30/19 0616  DDIMER  --   --  1.54* 1.84* 1.40*  FERRITIN 4,051*  --   --  >7,500* >7,500*  LDH 173  --   --   --   --   CRP 8.6*   < > 9.2* 12.9* 12.0*   < > = values in this interval not displayed.    No results found for: BNP  Recent Labs  Lab 09/28/19 0037 09/28/19 0641  PROCALCITON 0.29 0.36    Lab Results  Component Value Date   SARSCOV2NAA POSITIVE (A) 09/27/2019    Right scapular fracture: Following a mechanical fall-evaluated by orthopedics-Dr. Landau-recommendations are for sling management, nonweightbearing to right upper extremity-okay to use wrist/hands.  Follow with orthopedics in 2 weeks.  PT/OT consult appreciated-recommendations are for home health.  ESRD on HD MWF: Nephrology following and directing care.  HIV: Continue antiretrovirals  Hypotension: Etiology uncertain-no indication of sepsis or blood loss-probably related to?  Narcotics administered in the ED-also seems to be a chronic issue-on midodrine on days of dialysis-continue with midodrine.  He is asymptomatic.  No further work-up required.  Thrombocytopenia: Appears to be mild-likely secondary to COVID-19-follow.  Extrahepatic biliary dilatation: Has transaminitis which could be from COVID-19-alk phos/bilirubin not significantly elevated-RUQ ultrasound without any major abnormalities.  Suspect stable for outpatient follow-up and monitoring.  Anemia: Secondary to chronic disease-ESRD/HIV-stable for follow-up.  No indication of blood loss.  History of bilateral BKA: Ambulates with prosthesis  Insulin-dependent DM-2 (A1c on 09/28/2018 6.8): Hypoglycemic episode today-stop insulin 70/30-we will transition to Lantus and SSI and see how he does.  Recent Labs    09/29/19 2310 09/30/19 0744 09/30/19 1130  GLUCAP 142*  139* 122*    ABG:    Component Value Date/Time   PHART 7.460 (H) 03/03/2016 1439   PCO2ART 36.9 03/03/2016 1439   PO2ART 34.0 (LL) 03/03/2016 1439   HCO3 26.2 03/03/2016 1439   TCO2 30 05/28/2016 1414   TCO2 30 05/28/2016 1414   O2SAT 69.0 03/03/2016 1439    Vent Settings: N/A  Condition -Stable  Family Communication  : Spoke with patient's friend-Hermina ([phone]) on 5/18-we will update over the next few days  Code Status :  Full Code  Diet :  Diet Order            Diet renal/carb modified with fluid restriction Diet-HS Snack? Nothing; Fluid restriction: 1200 mL Fluid; Room service appropriate? Yes; Fluid consistency: Thin  Diet effective now               Disposition Plan  :   Status is: Inpatient  Remains inpatient appropriate because:Inpatient level of care appropriate due to severity of illness   Dispo: The patient is from: Home              Anticipated d/c is to: Home              Anticipated d/c date is: 3 days              Patient currently is not medically stable to d/c.  Barriers to discharge: Complete 5 days of IV Remdesivir  Antimicorbials  :    Anti-infectives (From admission, onward)   Start     Dose/Rate Route Frequency Ordered Stop   09/29/19 1000  remdesivir 100 mg in sodium chloride 0.9 % 100 mL IVPB     100 mg 200 mL/hr over 30 Minutes Intravenous Daily 09/28/19 0147 10/03/19 0959   09/28/19 2345  cefTRIAXone (ROCEPHIN) 2 g in sodium chloride 0.9 % 100 mL IVPB  Status:  Discontinued     2 g 200 mL/hr over 30 Minutes Intravenous Every 24 hours 09/28/19 0145 09/28/19 1200   09/28/19 1000  bictegravir-emtricitabine-tenofovir AF (BIKTARVY) 50-200-25 MG per tablet 1 tablet     1 tablet Oral Daily 09/28/19 0145     09/28/19 0200  remdesivir 200 mg in sodium chloride 0.9% 250  mL IVPB     200 mg 580 mL/hr over 30 Minutes Intravenous Once 09/28/19 0147 09/28/19 0400   09/27/19 2345  azithromycin (ZITHROMAX) 500 mg in sodium chloride 0.9 % 250  mL IVPB  Status:  Discontinued     500 mg 250 mL/hr over 60 Minutes Intravenous Every 24 hours 09/27/19 2331 09/28/19 1200   09/27/19 2345  cefTRIAXone (ROCEPHIN) 1 g in sodium chloride 0.9 % 100 mL IVPB  Status:  Discontinued     1 g 200 mL/hr over 30 Minutes Intravenous Every 24 hours 09/27/19 2331 09/28/19 0145      Inpatient Medications  Scheduled Meds: . bictegravir-emtricitabine-tenofovir AF  1 tablet Oral Daily  . Chlorhexidine Gluconate Cloth  6 each Topical Q0600  . doxercalciferol  7 mcg Intravenous Q M,W,F-HD  . heparin  3,000 Units Dialysis Once in dialysis  . heparin  5,000 Units Subcutaneous Q8H  . insulin aspart  0-6 Units Subcutaneous TID WC  . insulin glargine  12 Units Subcutaneous Daily  . midodrine  5 mg Oral TID AC  . mupirocin ointment  1 application Nasal BID   Continuous Infusions: . remdesivir 100 mg in NS 100 mL 100 mg (09/30/19 1001)   PRN Meds:.acetaminophen, HYDROcodone-acetaminophen, ondansetron **OR** ondansetron (ZOFRAN) IV, polyvinyl alcohol   Time Spent in minutes  25  See all Orders from today for further details   Oren Binet M.D on 09/30/2019 at 2:00 PM  To page go to [email or web address] - use universal password  [facility] -  Office  [phone]    Objective:   Vitals:   09/29/19 1446 09/29/19 2212 09/30/19 0403 09/30/19 0405  BP: 105/78 (!) 117/52 118/67   Pulse: 81 80 79   Resp: 16 18 16    Temp: 99 F (37.2 C) 98.2 F (36.8 C) 97.8 F (36.6 C)   TempSrc: Oral Oral Oral   SpO2: 97% 98% 100%   Weight:    52.1 kg  Height:        Wt Readings from Last 3 Encounters:  09/30/19 52.1 kg  09/21/19 56.7 kg  06/09/19 62.1 kg     Intake/Output Summary (Last 24 hours) at 09/30/2019 1400 Last data filed at 09/29/2019 1500 Gross per 24 hour  Intake 100 ml  Output --  Net 100 ml     Physical Exam Gen Exam:Alert awake-not in any distress HEENT:atraumatic, normocephalic Chest: B/L clear to auscultation  anteriorly CVS:S1S2 regular Abdomen:soft non tender, non distended Extremities: Bilateral BKA-stumps benign appearance. Neurology: Non focal Skin: no rash  Data Review:    CBC Recent Labs  Lab 09/26/19 2048 09/26/19 2048 09/27/19 1607 09/28/19 0417 09/28/19 0628 09/29/19 1257 09/30/19 0616  WBC 7.7   < > 5.0 3.7* 5.0 4.0 4.1  HGB 11.6*   < > 10.3* 9.4* 9.2* 10.4* 10.6*  HCT 35.2*   < > 31.0* 29.1* 28.3* 30.9* 31.7*  PLT 180   < > 143* 125* 115* 139* 147*  MCV 105.4*   < > 103.0* 104.7* 104.4* 102.3* 101.6*  MCH 34.7*   < > 34.2* 33.8 33.9 34.4* 34.0  MCHC 33.0   < > 33.2 32.3 32.5 33.7 33.4  RDW 14.4   < > 14.0 14.3 14.2 14.4 14.4  LYMPHSABS 1.1  --   --   --  1.0  --   --   MONOABS 0.9  --   --   --  0.4  --   --   EOSABS 0.1  --   --   --  0.0  --   --   BASOSABS 0.0  --   --   --  0.0  --   --    < > = values in this interval not displayed.    Chemistries  Recent Labs  Lab 09/27/19 1607 09/28/19 0417 09/28/19 0628 09/29/19 1257 09/30/19 0616  NA 141  --  140 138 136  K 3.8  --  4.5 3.5 4.0  CL 95*  --  98 95* 95*  CO2 28  --  25 25 25   GLUCOSE 123*  --  147* 108* 145*  BUN 22*  --  36* 19 33*  CREATININE 7.16* 8.81* 9.38* 5.54* 8.60*  CALCIUM 8.2*  --  7.8* 8.2* 8.5*  AST  --   --  109* 87* 85*  ALT  --   --  52* 62* 57*  ALKPHOS  --   --  156* 182* 186*  BILITOT  --   --  1.3* 0.8 0.6   ------------------------------------------------------------------------------------------------------------------ Recent Labs    09/28/19 0037  TRIG 193*    Lab Results  Component Value Date   HGBA1C 6.8 09/28/2018   ------------------------------------------------------------------------------------------------------------------ No results for input(s): TSH, T4TOTAL, T3FREE, THYROIDAB in the last 72 hours.  Invalid input(s): FREET3 ------------------------------------------------------------------------------------------------------------------ Recent Labs     09/29/19 1257 09/30/19 0616  FERRITIN >7,500* >7,500*  TIBC  --  186*  IRON  --  156    Coagulation profile Recent Labs  Lab 09/27/19 1608  INR 1.1    Recent Labs    09/29/19 1257 09/30/19 0616  DDIMER 1.84* 1.40*    Cardiac Enzymes No results for input(s): CKMB, TROPONINI, MYOGLOBIN in the last 168 hours.  Invalid input(s): CK ------------------------------------------------------------------------------------------------------------------ No results found for: BNP  Micro Results Recent Results (from the past 240 hour(s))  Blood Culture (routine x 2)     Status: None (Preliminary result)   Collection Time: 09/27/19  4:45 PM   Specimen: BLOOD  Result Value Ref Range Status   Specimen Description BLOOD RIGHT ANTECUBITAL  Final   Special Requests   Final    BOTTLES DRAWN AEROBIC AND ANAEROBIC Blood Culture results may not be optimal due to an inadequate volume of blood received in culture bottles   Culture   Final    NO GROWTH 3 DAYS Performed at [facility], [place] [address], [place], [place] [address]    Report Status PENDING  Incomplete  Blood Culture (routine x 2)     Status: None (Preliminary result)   Collection Time: 09/27/19  4:50 PM   Specimen: BLOOD RIGHT HAND  Result Value Ref Range Status   Specimen Description BLOOD RIGHT HAND  Final   Special Requests   Final    BOTTLES DRAWN AEROBIC AND ANAEROBIC Blood Culture adequate volume   Culture   Final    NO GROWTH 3 DAYS Performed at [facility], [place] [address], [place], [place] [address]    Report Status PENDING  Incomplete  SARS Coronavirus 2 by RT PCR (hospital order, performed in [facility] hospital lab) Nasopharyngeal Nasopharyngeal Swab     Status: Abnormal   Collection Time: 09/27/19  9:17 PM   Specimen: Nasopharyngeal Swab  Result Value Ref Range Status   SARS Coronavirus 2 POSITIVE (A) NEGATIVE Final    Comment: RESULT CALLED TO, READ BACK BY AND VERIFIED WITH: B BECK RN  09/27/19 2255 JDW (NOTE) SARS-CoV-2 target nucleic acids are DETECTED SARS-CoV-2 RNA is generally detectable in  upper respiratory specimens  during the acute phase of infection.  Positive results are indicative  of the presence of the identified virus, but do not rule out bacterial infection or co-infection with other pathogens not detected by the test.  Clinical correlation with patient history and  other diagnostic information is necessary to determine patient infection status.  The expected result is negative. Fact Sheet for Patients:   StrictlyIdeas.no  Fact Sheet for Healthcare Providers:   BankingDealers.co.za   This test is not yet approved or cleared by the [place] FDA and  has been authorized for detection and/or diagnosis of SARS-CoV-2 by FDA under an Emergency Use Authorization (EUA).  This EUA will remain in effect (meaning this test can be used)  for the duration of  the COVID-19 declaration under Section 564(b)(1) of the Act, 21 U.S.C. section 360-bbb-3(b)(1), unless the authorization is terminated or revoked sooner. Performed at [facility], [place] [address] [place], [place] [address]   MRSA PCR Screening     Status: Abnormal   Collection Time: 09/29/19 11:30 PM  Result Value Ref Range Status   MRSA by PCR POSITIVE (A) NEGATIVE Final    Comment:        The GeneXpert MRSA Assay (FDA approved for NASAL specimens only), is one component of a comprehensive MRSA colonization surveillance program. It is not intended to diagnose MRSA infection nor to guide or monitor treatment for MRSA infections. RESULT CALLED TO, READ BACK BY AND VERIFIED WITH: H PENG RRN 09/30/19 0231 JDW Performed at [facility], [address] [place], [place] [address]     Radiology Reports CT HEAD WO CONTRAST  Result Date: 09/27/2019 CLINICAL DATA:  Headache EXAM: CT HEAD WITHOUT CONTRAST TECHNIQUE: Contiguous axial images  were obtained from the base of the skull through the vertex without intravenous contrast. COMPARISON:  CT brain 06/21/2019 FINDINGS: Brain: No evidence of acute infarction, hemorrhage, hydrocephalus, extra-axial collection or mass lesion/mass effect. Vascular: No hyperdense vessel or unexpected calcification. Skull: Normal. Negative for fracture or focal lesion. Sinuses/Orbits: Mucosal thickening in the ethmoid sinuses. Other: None. IMPRESSION: Negative non contrasted CT appearance of the brain.  Sinus disease. Electronically Signed   By: Donavan Foil M.D.   On: 09/27/2019 19:17   US Abdomen Complete  Result Date: 09/28/2019 CLINICAL DATA:  Acute generalized abdominal pain. EXAM: ABDOMEN ULTRASOUND COMPLETE COMPARISON:  Sep 26, 2019.  April 20, 2014. FINDINGS: Gallbladder: No gallstones or wall thickening visualized. No sonographic Murphy sign noted by sonographer. Common bile duct: Diameter: 13 mm which is unchanged compared to prior exam. No common bile duct stone is noted. Liver: No focal lesion identified. Within normal limits in parenchymal echogenicity. Portal vein is patent on color Doppler imaging with normal direction of blood flow towards the liver. IVC: No abnormality visualized. Pancreas: Visualized portion unremarkable. Spleen: Size and appearance within normal limits. Right Kidney: Length: 8.9 cm. 9 mm nonobstructive calculus seen in midpole of right kidney. Echogenicity within normal limits. No mass or hydronephrosis visualized. Left Kidney: Length: 9.7 cm. 1.4 cm nonobstructive calculus seen in lower pole. Echogenicity within normal limits. No mass or hydronephrosis visualized. Abdominal aorta: No aneurysm visualized. Other findings: None. IMPRESSION: Bilateral nonobstructive nephrolithiasis. No hydronephrosis or renal obstruction is noted. Stable dilatation of common bile duct which is unchanged compared to prior exams. No definite evidence of calculus is noted. Electronically Signed   By:  Marijo Conception M.D.   On: 09/28/2019 09:20   DG Chest  Portable 1 View  Result Date: 09/27/2019 CLINICAL DATA:  Chest pain EXAM: PORTABLE CHEST 1 VIEW COMPARISON:  August 27, 2018 FINDINGS: The heart size and mediastinal contours are within normal limits. No new consolidation or edema. No pleural effusion or pneumothorax. No new osseous abnormality. IMPRESSION: No acute process in the chest. Electronically Signed   By: Macy Mis M.D.   On: 09/27/2019 16:26   CT Renal Stone Study  Result Date: 09/26/2019 CLINICAL DATA:  Left flank pain for 3 days, on dialysis EXAM: CT ABDOMEN AND PELVIS WITHOUT CONTRAST TECHNIQUE: Multidetector CT imaging of the abdomen and pelvis was performed following the standard protocol without IV contrast. COMPARISON:  CT 04/01/2016 FINDINGS: Lower chest: Atelectatic changes present in the lung bases. Normal heart size. No pericardial effusion. Coronary artery calcifications are present. Hepatobiliary: No focal liver abnormality is seen within the limitations of this unenhanced CT. No gallstones, gallbladder wall thickening, or biliary dilatation. Pancreas: Unremarkable. No pancreatic ductal dilatation or surrounding inflammatory changes. Spleen: Normal in size without focal abnormality. Adrenals/Urinary Tract: Normal adrenal glands. Redemonstration of extensive calcification near the corticomedullary junctions which could reflect some medullary nephrocalcinosis versus urolithiasis. No frank hydronephrosis is seen however there are several hyperattenuating foci along the course of the distal ureter, larger distal calcification measuring up to 3 (5/83, 2/67). Thinner more elongated 2 mm calculus is seen just proximal (2/64, 5/85). No right ureteral calculi. Urinary bladder is largely decompressed at the time of exam and therefore poorly evaluated by CT imaging. No gross bladder abnormality. Stomach/Bowel: Distal esophagus, stomach and duodenal sweep are unremarkable. No small bowel  wall thickening or dilatation. No evidence of obstruction. A normal appendix is visualized. Much of the colon is fluid-filled without edematous mural thickening or distension. Vascular/Lymphatic: Atherosclerotic calcifications throughout the abdominal aorta and branch vessels. No aneurysm or ectasia. No enlarged abdominopelvic lymph nodes. Reproductive: The prostate and seminal vesicles are unremarkable. Other: No abdominopelvic free fluid or free gas. No bowel containing hernias. Musculoskeletal: Some focal increased attenuate about the vertebral body endplates may reflect early changes of renal osteodystrophy. No acute osseous abnormality or suspicious osseous lesion. IMPRESSION: 1. There at least 2 small calculi seen within the distal left ureter measuring up to 3 mm with only mild ureterectasis but no frank hydronephrosis. 2. More diffuse calcification about the corticomedullary junctions could reflect additional nonobstructing nephrolithiasis or medullary nephrocalcinosis. 3. Much of the colon is fluid-filled without edematous mural thickening or distension. Findings are nonspecific but can be seen with diarrheal illness. Correlate with patient's symptoms 4. Coronary artery calcifications. 5. Aortic Atherosclerosis (ICD10-I70.0). 6. 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 Electronically Signed   By: Lovena Le M.D.   On: 09/26/2019 19:53   CT Angio Chest/Abd/Pel for Dissection W and/or W/WO  Result Date: 09/27/2019 CLINICAL DATA:  Left flank pain, hypotensive EXAM: CT ANGIOGRAPHY CHEST, ABDOMEN AND PELVIS TECHNIQUE: Non-contrast CT of the chest was initially obtained. Multidetector CT imaging through the chest, abdomen and pelvis was performed using the standard protocol during bolus administration of intravenous contrast. Multiplanar reconstructed images and MIPs were obtained and reviewed to evaluate the vascular anatomy. CONTRAST:  54m OMNIPAQUE IOHEXOL 350 MG/ML SOLN COMPARISON:  CT renal  colic 053/[id number]/4403FINDINGS: CTA CHEST FINDINGS Cardiovascular: Noncontrast CT of the chest reveals no abnormal hyperdense mural thickening of the thoracic aorta to suggest intramural hematoma. Postcontrast administration there is satisfactory arterial phase enhancement. The aortic root and ascending aorta is suboptimally assessed given cardiac pulsation artifact. The aorta is normal  caliber. No convincing features of dissection flap, minimal aortic injury, or other acute luminal abnormality of the aorta is seen. No periaortic stranding or hemorrhage. Minimal plaque seen within the aortic arch. The left vertebral artery arises directly from the aortic arch. Remaining proximal great vessels are unremarkable aside from minimal calcific plaque in the right subclavian artery. Central pulmonary arteries are normal caliber. Satisfactory pulmonary arterial opacification no central, lobar or segmental filling defects are identified. Normal heart size. No pericardial effusion. Mediastinum/Nodes: Calcified lymph nodes are present in the right hilar region. May reflect sequela of remote granulomatous disease. No worrisome mediastinal, hilar or axillary adenopathy. No mediastinal fluid or gas. Normal thyroid gland and thoracic inlet. No acute abnormality of the trachea or esophagus. Lungs/Pleura: Scattered multifocal areas of mixed ground-glass and consolidation throughout the lungs with a basilar and peripheral predominance on a background of more dependent atelectatic change. No pneumothorax or effusion. No convincing features of edema. No conspicuous pulmonary nodules or masses. Musculoskeletal: There is a highly comminuted fracture of the right scapula with separate fracture lines extending throughout the supraspinous and infraspinous fossa and with extension through the medial and lateral borders as well as into the scapular spine near the base of the coracoid which is incompletely imaged on this exam. There is extensive  overlying contusive change of the musculature of the posterior shoulder. There is a remote posttraumatic deformity of the distal left clavicle. No other acute or suspicious osseous lesions. Review of the MIP images confirms the above findings. CTA ABDOMEN AND PELVIS FINDINGS VASCULAR Aorta: Atherosclerotic plaque within the normal caliber aorta. No significant stenosis. No evidence of aneurysm, dissection or vasculitis. Celiac: Widely patent ostium. Calcified and noncalcified plaque within the splenic artery with minimal multifocal plaque narrowing but no other significant stenosis. No evidence of aneurysm, dissection or vasculitis. SMA: Normally opacified without evidence of aneurysm, dissection, vasculitis or significant stenosis. Renals: Paired renal arteries bilaterally. Renal arteries are patent without evidence of aneurysm, dissection, vasculitis or fibromuscular dysplasia. IMA: Mild narrowing at the IMA ostium. More distal vessel is normally opacified without significant stenosis. No evidence of aneurysm, dissection or vasculitis. Inflow: Plaque seen within the right common and internal iliac artery without significant flow-limiting stenosis. No evidence of aneurysm, dissection or vasculitis. Plaque seen throughout the proximal outflow vessels as well without significant stenosis. Veins: No obvious venous abnormality within the limitations of this arterial phase study. Review of the MIP images confirms the above findings. NON-VASCULAR Hepatobiliary: No focal liver abnormality is seen. No visible gallstones or gallbladder wall thickening. No pericholecystic fluid. Mild extrahepatic biliary ductal dilatation with the common bile duct measuring up to 9 mm. No visible intraductal gallstones. Pancreas: Unremarkable. No pancreatic ductal dilatation or surrounding inflammatory changes. Spleen: Normal in size without focal abnormality. Adrenals/Urinary Tract: Normal adrenal glands. Redemonstration of the  calcification in the corticomedullary junctions, possibly involving the papilla. Few do appear to reflect collecting system calculi however. Additionally, there is redemonstration of the punctate calcifications in the distal ureter which do not appear to have significantly changed from prior the more distal calcification measures up to 3 mm (7/267) while the more proximal calculus measures 2 mm (7/259). There is a lack of significant urinary tract dilatation which could suggest these to be nonobstructive calculi. No concerning renal masses. No distal calcifications of the right urinary collecting system. Urinary bladder is largely decompressed at the time of exam and therefore poorly evaluated by CT imaging. Mild perivesicular hazy stranding. No other gross bladder abnormality.  Stomach/Bowel: Stomach is within normal limits. Appendix appears normal. Question some mild diffuse bowel wall thickening but without frank distention or features of obstruction. Much of the colon also appears slightly thickened and fluid-filled. Appendix is fluid-filled but otherwise unremarkable without significant periappendiceal stranding. Lymphatic: No suspicious or enlarged lymph nodes in the included lymphatic chains. Reproductive: The prostate and seminal vesicles are unremarkable. Other: No abdominopelvic free air or fluid. Musculoskeletal: No suspicious osseous lesions. Increased attenuation towards the vertebral body endplates may suggest some degree of renal osteodystrophy. Review of the MIP images confirms the above findings. IMPRESSION: 1. No evidence of acute aortic syndrome or other acute luminal abnormality of the aorta. 2. Highly comminuted fracture of the right scapula with separate fracture lines extending throughout the supraspinous and infraspinous fossa as well as into the scapular spine near the base of the coracoid which is incompletely imaged on this exam. Consider further evaluation with dedicated shoulder CT. 3.  Scattered multifocal areas of mixed ground-glass and consolidation throughout the lungs with a basilar and peripheral predominance on a background of more dependent atelectatic change. Findings are favored to represent multifocal pneumonia or aspiration. Potential etiologies include atypical viral such as COVID-19. 4. Mild extrahepatic biliary ductal dilatation with the common bile duct measuring up to 9 mm. No visible intraductal gallstones. Recommend correlation with LFTs and if there is concern for biliary obstruction, right upper quadrant ultrasound could be obtained. 5. Redemonstration of the calcification in the corticomedullary junctions, possibly involving the papilla. While several of these may reflect nonobstructive calculi. 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 6. Punctate calculi again seen in the distal left ureter without significant migration from the comparison. There is a lack of significant urinary tract dilatation which could suggest these to be only partially obstructive. 7. Some mild diffuse large and small bowel thickening with a fluid-filled colon. Findings are nonspecific, and may represent a diarrheal illness/enterocolitis. These results were called by telephone at the time of interpretation on 09/27/2019 at 9:25 pm to provider [facility] , who verbally acknowledged these results. Electronically Signed   By: Lovena Le M.D.   On: 09/27/2019 21:25

## 2019-10-01 LAB — CBC
HCT: 31.2 % — ABNORMAL LOW (ref 39.0–52.0)
Hemoglobin: 10.4 g/dL — ABNORMAL LOW (ref 13.0–17.0)
MCH: 33.8 pg (ref 26.0–34.0)
MCHC: 33.3 g/dL (ref 30.0–36.0)
MCV: 101.3 fL — ABNORMAL HIGH (ref 80.0–100.0)
Platelets: 149 10*3/uL — ABNORMAL LOW (ref 150–400)
RBC: 3.08 MIL/uL — ABNORMAL LOW (ref 4.22–5.81)
RDW: 14.3 % (ref 11.5–15.5)
WBC: 5.4 10*3/uL (ref 4.0–10.5)
nRBC: 0 % (ref 0.0–0.2)

## 2019-10-01 LAB — COMPREHENSIVE METABOLIC PANEL
ALT: 49 U/L — ABNORMAL HIGH (ref 0–44)
AST: 72 U/L — ABNORMAL HIGH (ref 15–41)
Albumin: 2.7 g/dL — ABNORMAL LOW (ref 3.5–5.0)
Alkaline Phosphatase: 184 U/L — ABNORMAL HIGH (ref 38–126)
Anion gap: 19 — ABNORMAL HIGH (ref 5–15)
BUN: 54 mg/dL — ABNORMAL HIGH (ref 6–20)
CO2: 23 mmol/L (ref 22–32)
Calcium: 8.6 mg/dL — ABNORMAL LOW (ref 8.9–10.3)
Chloride: 95 mmol/L — ABNORMAL LOW (ref 98–111)
Creatinine, Ser: 11.4 mg/dL — ABNORMAL HIGH (ref 0.61–1.24)
GFR calc Af Amer: 5 mL/min — ABNORMAL LOW (ref >60)
GFR calc non Af Amer: 5 mL/min — ABNORMAL LOW (ref >60)
Glucose, Bld: 101 mg/dL — ABNORMAL HIGH (ref 70–99)
Potassium: 4 mmol/L (ref 3.5–5.1)
Sodium: 137 mmol/L (ref 135–145)
Total Bilirubin: 1.6 mg/dL — ABNORMAL HIGH (ref 0.3–1.2)
Total Protein: 7.7 g/dL (ref 6.5–8.1)

## 2019-10-01 LAB — C-REACTIVE PROTEIN: CRP: 12.2 mg/dL — ABNORMAL HIGH (ref <1.0)

## 2019-10-01 LAB — D-DIMER, QUANTITATIVE: D-Dimer, Quant: 0.99 ug/mL-FEU — ABNORMAL HIGH (ref 0.00–0.50)

## 2019-10-01 LAB — GLUCOSE, CAPILLARY
Glucose-Capillary: 93 mg/dL (ref 70–99)
Glucose-Capillary: 103 mg/dL — ABNORMAL HIGH (ref 70–99)
Glucose-Capillary: 105 mg/dL — ABNORMAL HIGH (ref 70–99)

## 2019-10-01 LAB — FERRITIN: Ferritin: 6541 ng/mL — ABNORMAL HIGH (ref 24–336)

## 2019-10-01 MED ORDER — HEPARIN SODIUM (PORCINE) 1000 UNIT/ML IJ SOLN
INTRAMUSCULAR | Status: AC
  Administered 2019-10-01: 2000 [IU]
  Filled 2019-10-01: qty 2

## 2019-10-01 MED ORDER — DOXERCALCIFEROL 4 MCG/2ML IV SOLN
INTRAVENOUS | Status: AC
  Filled 2019-10-01: qty 4

## 2019-10-01 MED ORDER — ALUM & MAG HYDROXIDE-SIMETH 200-200-20 MG/5ML PO SUSP
30.0000 mL | ORAL | Status: DC | PRN
  Filled 2019-10-01: qty 30

## 2019-10-01 MED ORDER — CHLORHEXIDINE GLUCONATE CLOTH 2 % EX PADS
6.0000 | MEDICATED_PAD | Freq: Every day | CUTANEOUS | Status: DC
  Administered 2019-10-01 – 2019-10-02 (×2): 6 via TOPICAL

## 2019-10-01 MED ORDER — MIDODRINE HCL 5 MG PO TABS
ORAL_TABLET | ORAL | Status: AC
  Administered 2019-10-01: 5 mg via ORAL
  Filled 2019-10-01: qty 1

## 2019-10-01 NOTE — Progress Notes (Signed)
[facility] Progress Note  Subjective:   Patient not examined today directly given COVID-19 + status, utilizing data taken from chart +/- discussions w/ providers and staff.    Vitals:   10/01/19 1300 10/01/19 1330 10/01/19 1400 10/01/19 1430  BP: 97/61 110/62 118/67 119/69  Pulse: 71 76 81 76  Resp:      Temp:      TempSrc:      SpO2:      Weight:      Height:        Exam:  Patient not examined today directly given COVID-19 + status, utilizing data taken from chart +/- discussions w/ providers and staff.     Home meds:  - biktarvy 1 qd  - insulin  70-30 15u bid  - vicodin prn/ skelaxin 800 tid/ zofran prn  - midodrine 10mg  preHD TTS  - prn's/ vitamins/ supplements    OP HD: MWF [facility]   4h  54.5kg  400/1.5  2/2 bath  LUE AVF  Hep 2000    hect 7  mirc 60 q 2 last 4/14  full HD 5/17   Assessment/ Plan: 1. COVID +PNA - w/ + CT changes and low grade fevers. Pt not sig symptomatic. Started on IV remdesivir for high risk of deterioration and IV abx for possible bact PNA.  2. ESRD - on HD MWF. HD today on schedule.  3. BP/volume - 2kg under dry wt, no vol excess on exam. Chronic hypotension on midodrine pre HD at home, getting 5 tid here for soft BP's. 0-1 L UF today.  4. HIV - on ART 5. DM - on insulin 6. Anemia ckd - Hb 9.2, last esa in early April. Restart if needed. Tsat today is 84%, no need for IV Fe.  7. MBD ckd - Ca ok, cont IV hect. Using samples for binders at home, last medication was fosrenol 1 gm tid. Phos good 3.8    Alvin Daniels 10/01/2019, 2:35 PM   Recent Labs  Lab 09/29/19 1257 09/29/19 1443 09/30/19 0616 10/01/19 0400  K   < >  --  4.0 4.0  BUN   < >  --  33* 54*  CREATININE   < >  --  8.60* 11.40*  CALCIUM   < >  --  8.5* 8.6*  PHOS  --  3.8  --   --   HGB   < >  --  10.6* 10.4*   < > = values in this interval not displayed.   Inpatient medications: . bictegravir-emtricitabine-tenofovir AF  1 tablet Oral Daily  .  Chlorhexidine Gluconate Cloth  6 each Topical Q0600  . Chlorhexidine Gluconate Cloth  6 each Topical Q0600  . doxercalciferol      . doxercalciferol  7 mcg Intravenous Q M,W,F-HD  . famotidine  20 mg Oral Daily  . heparin  3,000 Units Dialysis Once in dialysis  . heparin  5,000 Units Subcutaneous Q8H  . insulin aspart  0-6 Units Subcutaneous TID WC  . insulin glargine  12 Units Subcutaneous Daily  . midodrine  5 mg Oral TID AC  . mupirocin ointment  1 application Nasal BID   . remdesivir 100 mg in NS 100 mL 100 mg (10/01/19 0847)   acetaminophen, alum & mag hydroxide-simeth, HYDROcodone-acetaminophen, ondansetron **OR** ondansetron (ZOFRAN) IV, polyvinyl alcohol

## 2019-10-01 NOTE — Progress Notes (Signed)
Pt HD tx terminated at 3hrs instead of ordered 4 hours per pt request. Pt reports pain in right eye with prolonged tx.  Dr. Jonnie Finner notified, no new orders.

## 2019-10-01 NOTE — Progress Notes (Signed)
PT Cancellation Note  Patient Details Name: Brevin Mcfadden MRN: [id number] DOB: January 25, 1971   Cancelled Treatment:    Reason Eval/Treat Not Completed: Patient at procedure or test/unavailable.  Pt is at HD.  PT will try back later today and/or Monday 10/04/19 as time allows.  Thanks,  Verdene Lennert, PT, DPT  Acute Rehabilitation [phone] pager #[phone] office       Wells Guiles B Kayela Humphres 10/01/2019, 1:18 PM

## 2019-10-01 NOTE — Plan of Care (Signed)
  Problem: Clinical Measurements: Goal: Respiratory complications will improve Outcome: Progressing   

## 2019-10-01 NOTE — Progress Notes (Signed)
Renal Navigator attempted to contact patient to ensure that he is aware of his OP HD isolation shift schedule. Navigator called patient with a Administrator, sports from [employer], but he answered and stated that he was tired and to call back later. Patient then disconnected the phone.  Navigator has notified patient's OP HD clinic/[facility] of patient's potential for discharge tomorrow, Saturday, 10/02/19 per Primary, and [facility] staff have contacted a family member who reports she will provide transportation for patient.  Alphonzo Cruise, [place] Renal Navigator [phone]

## 2019-10-01 NOTE — Care Management (Signed)
CM working remote.  CM has attempted to reach pt multiple times via phone - however no answer.  CM attempting to arrange [facility] as ordered. TOC will continue to reach out to pt to provide discharge planning

## 2019-10-01 NOTE — Progress Notes (Signed)
PROGRESS NOTE                                                                                                                                                                                                             Patient Demographics:    Alvin Daniels, is a 49 y.o. male, DOB - 10-30-1970, HOZ:[id number]  Outpatient Primary MD for the patient is Camillia Herter, NP   Admit date - 09/27/2019   LOS - 4  Chief Complaint  Patient presents with  . Chest Pain  . Back Pain       Brief Narrative: Patient is a 49 y.o. male with PMHx of ESRD on HD MWF, HIV on antiretrovirals, bilateral BKA, DM-2, HTN-who sustained a mechanical fall approximately a week prior to this hospitalization-presented to the ED with right-sided chest pain-he was found to have right scapular fracture, and also found to have COVID-19 pneumonia.  See below for further details.  Significant Events: 5/17>> admit to [facility] for right scapula fracture/Covid pneumonia.  COVID-19 medications: Remdesivir: 5/17>>  Antibiotics: Rocephin: 5/17>> 5/18 Zithromax: 5/17>> 5/18  Microbiology data: 5/17: Blood culture>> no growth  Significant studies: 5/17: CT angiogram chest/abdomen/pelvis>> highly comminuted right scapular fracture, scattered multi focal groundglass opacities, mild extrahepatic biliary ductal dilatation.  No evidence of acute aortic syndrome.  DVT prophylaxis: SQ heparin  Procedures: None  Consults: Nephrology    Subjective:   Anxious to go home-mild cough but really no other complaints.   Assessment  & Plan :    Covid 19 Viral pneumonia: Stable-not hypoxic-CRP still significantly elevated (could be scapular fracture)-continue IV remdesivir-no role for steroids as not hypoxic.  Given risk factors for severe disease-continue with remdesivir for now.    Fever: afebrile/febrile Prone/Incentive Spirometry: encouraged incentive  spirometry use 3-4/hour. O2 requirements:  SpO2: 95 %   COVID-19 Labs: Recent Labs    09/29/19 1257 09/30/19 0616 10/01/19 0400  DDIMER 1.84* 1.40* 0.99*  FERRITIN >7,500* >7,500* 6,541*  CRP 12.9* 12.0* 12.2*    No results found for: BNP  Recent Labs  Lab 09/28/19 0037 09/28/19 0641  PROCALCITON 0.29 0.36    Lab Results  Component Value Date   SARSCOV2NAA POSITIVE (A) 09/27/2019    Right scapular fracture: Following a mechanical fall-evaluated by orthopedics-Dr. Landau-recommendations are for sling management, nonweightbearing to right upper extremity-okay to use  wrist/hands.  Follow with orthopedics in 2 weeks.  PT/OT consult appreciated-recommendations are for home health.  ESRD on HD MWF: Nephrology following and directing care.  Per patient report-he always gets eye pain with hemodialysis.  HIV: Continue antiretrovirals  Hypotension: Etiology uncertain-no indication of sepsis or blood loss-probably related to?  Narcotics administered in the ED-also seems to be a chronic issue-on midodrine on days of dialysis-continue with midodrine.  He is asymptomatic.  No further work-up required.  Thrombocytopenia: Appears to be mild-likely secondary to COVID-19-follow.  Extrahepatic biliary dilatation: Has transaminitis which could be from COVID-19-alk phos/bilirubin not significantly elevated-RUQ ultrasound without any major abnormalities.  Suspect stable for outpatient follow-up and monitoring.  Anemia: Secondary to chronic disease-ESRD/HIV-stable for follow-up.  No indication of blood loss.  History of bilateral BKA: Ambulates with prosthesis  Insulin-dependent DM-2 (A1c on 09/28/2018 6.8): Hypoglycemic episode today-stop insulin 70/30-we will transition to Lantus and SSI and see how he does.  Recent Labs    09/30/19 1613 09/30/19 2038 10/01/19 0812  GLUCAP 123* 155* 93    ABG:    Component Value Date/Time   PHART 7.460 (H) 03/03/2016 1439   PCO2ART 36.9 03/03/2016  1439   PO2ART 34.0 (LL) 03/03/2016 1439   HCO3 26.2 03/03/2016 1439   TCO2 30 05/28/2016 1414   TCO2 30 05/28/2016 1414   O2SAT 69.0 03/03/2016 1439    Vent Settings: N/A  Condition -Stable  Family Communication  : Spoke with patient's friend-Hermina ([phone]) on 5/18-we will update over the next few days  Code Status :  Full Code  Diet :  Diet Order            Diet renal/carb modified with fluid restriction Diet-HS Snack? Nothing; Fluid restriction: 1200 mL Fluid; Room service appropriate? Yes; Fluid consistency: Thin  Diet effective now               Disposition Plan  :   Status is: Inpatient  Remains inpatient appropriate because:Inpatient level of care appropriate due to severity of illness   Dispo: The patient is from: Home              Anticipated d/c is to: Home              Anticipated d/c date is: 3 days              Patient currently is not medically stable to d/c.  Tentative discharge on 5/22  Barriers to discharge: Complete 5 days of IV Remdesivir  Antimicorbials  :    Anti-infectives (From admission, onward)   Start     Dose/Rate Route Frequency Ordered Stop   09/29/19 1000  remdesivir 100 mg in sodium chloride 0.9 % 100 mL IVPB     100 mg 200 mL/hr over 30 Minutes Intravenous Daily 09/28/19 0147 10/03/19 0959   09/28/19 2345  cefTRIAXone (ROCEPHIN) 2 g in sodium chloride 0.9 % 100 mL IVPB  Status:  Discontinued     2 g 200 mL/hr over 30 Minutes Intravenous Every 24 hours 09/28/19 0145 09/28/19 1200   09/28/19 1000  bictegravir-emtricitabine-tenofovir AF (BIKTARVY) 50-200-25 MG per tablet 1 tablet     1 tablet Oral Daily 09/28/19 0145     09/28/19 0200  remdesivir 200 mg in sodium chloride 0.9% 250 mL IVPB     200 mg 580 mL/hr over 30 Minutes Intravenous Once 09/28/19 0147 09/28/19 0400   09/27/19 2345  azithromycin (ZITHROMAX) 500 mg in sodium chloride 0.9 % 250 mL IVPB  Status:  Discontinued     500 mg 250 mL/hr over 60 Minutes Intravenous  Every 24 hours 09/27/19 2331 09/28/19 1200   09/27/19 2345  cefTRIAXone (ROCEPHIN) 1 g in sodium chloride 0.9 % 100 mL IVPB  Status:  Discontinued     1 g 200 mL/hr over 30 Minutes Intravenous Every 24 hours 09/27/19 2331 09/28/19 0145      Inpatient Medications  Scheduled Meds: . bictegravir-emtricitabine-tenofovir AF  1 tablet Oral Daily  . Chlorhexidine Gluconate Cloth  6 each Topical Q0600  . Chlorhexidine Gluconate Cloth  6 each Topical Q0600  . doxercalciferol  7 mcg Intravenous Q M,W,F-HD  . famotidine  20 mg Oral Daily  . heparin  3,000 Units Dialysis Once in dialysis  . heparin  5,000 Units Subcutaneous Q8H  . insulin aspart  0-6 Units Subcutaneous TID WC  . insulin glargine  12 Units Subcutaneous Daily  . midodrine  5 mg Oral TID AC  . mupirocin ointment  1 application Nasal BID   Continuous Infusions: . remdesivir 100 mg in NS 100 mL 100 mg (10/01/19 0847)   PRN Meds:.acetaminophen, alum & mag hydroxide-simeth, HYDROcodone-acetaminophen, ondansetron **OR** ondansetron (ZOFRAN) IV, polyvinyl alcohol   Time Spent in minutes  25  See all Orders from today for further details   Oren Binet M.D on 10/01/2019 at 3:18 PM  To page go to [email or web address] - use universal password  [facility] -  Office  [phone]    Objective:   Vitals:   10/01/19 1330 10/01/19 1400 10/01/19 1430 10/01/19 1439  BP: 110/62 118/67 119/69 131/74  Pulse: 76 81 76 79  Resp:    16  Temp:    98.7 F (37.1 C)  TempSrc:    Oral  SpO2:    95%  Weight:    52 kg  Height:        Wt Readings from Last 3 Encounters:  10/01/19 52 kg  09/21/19 56.7 kg  06/09/19 62.1 kg     Intake/Output Summary (Last 24 hours) at 10/01/2019 1518 Last data filed at 10/01/2019 1439 Gross per 24 hour  Intake 100 ml  Output -50 ml  Net 150 ml     Physical Exam Gen Exam:Alert awake-not in any distress HEENT:atraumatic, normocephalic Chest: B/L clear to auscultation anteriorly CVS:S1S2  regular Abdomen:soft non tender, non distended Extremities: Bilateral BKA-stumps benign appearance. Neurology: Non focal Skin: no rash  Data Review:    CBC Recent Labs  Lab 09/26/19 2048 09/27/19 1607 09/28/19 0417 09/28/19 0628 09/29/19 1257 09/30/19 0616 10/01/19 0400  WBC 7.7   < > 3.7* 5.0 4.0 4.1 5.4  HGB 11.6*   < > 9.4* 9.2* 10.4* 10.6* 10.4*  HCT 35.2*   < > 29.1* 28.3* 30.9* 31.7* 31.2*  PLT 180   < > 125* 115* 139* 147* 149*  MCV 105.4*   < > 104.7* 104.4* 102.3* 101.6* 101.3*  MCH 34.7*   < > 33.8 33.9 34.4* 34.0 33.8  MCHC 33.0   < > 32.3 32.5 33.7 33.4 33.3  RDW 14.4   < > 14.3 14.2 14.4 14.4 14.3  LYMPHSABS 1.1  --   --  1.0  --   --   --   MONOABS 0.9  --   --  0.4  --   --   --   EOSABS 0.1  --   --  0.0  --   --   --   BASOSABS 0.0  --   --  0.0  --   --   --    < > = values in this interval not displayed.    Chemistries  Recent Labs  Lab 09/27/19 1607 09/27/19 1607 09/28/19 0417 09/28/19 0628 09/29/19 1257 09/30/19 0616 10/01/19 0400  NA 141  --   --  140 138 136 137  K 3.8  --   --  4.5 3.5 4.0 4.0  CL 95*  --   --  98 95* 95* 95*  CO2 28  --   --  _0 GLUCOSE 123*  --   --  147* 108* 145* 101*  BUN 22*  --   --  36* 19 33* 54*  CREATININE 7.16*   < > 8.81* 9.38* 5.54* 8.60* 11.40*  CALCIUM 8.2*  --   --  7.8* 8.2* 8.5* 8.6*  AST  --   --   --  109* 87* 85* 72*  ALT  --   --   --  52* 62* 57* 49*  ALKPHOS  --   --   --  156* 182* 186* 184*  BILITOT  --   --   --  1.3* 0.8 0.6 1.6*   < > = values in this interval not displayed.   ------------------------------------------------------------------------------------------------------------------ No results for input(s): CHOL, HDL, LDLCALC, TRIG, CHOLHDL, LDLDIRECT in the last 72 hours.  Lab Results  Component Value Date   HGBA1C 6.8 09/28/2018   ------------------------------------------------------------------------------------------------------------------ No results for  input(s): TSH, T4TOTAL, T3FREE, THYROIDAB in the last 72 hours.  Invalid input(s): FREET3 ------------------------------------------------------------------------------------------------------------------ Recent Labs    09/30/19 0616 10/01/19 0400  FERRITIN >7,500* 6,541*  TIBC 186*  --   IRON 156  --     Coagulation profile Recent Labs  Lab 09/27/19 1608  INR 1.1    Recent Labs    09/30/19 0616 10/01/19 0400  DDIMER 1.40* 0.99*    Cardiac Enzymes No results for input(s): CKMB, TROPONINI, MYOGLOBIN in the last 168 hours.  Invalid input(s): CK ------------------------------------------------------------------------------------------------------------------ No results found for: BNP  Micro Results Recent Results (from the past 240 hour(s))  Blood Culture (routine x 2)     Status: None (Preliminary result)   Collection Time: 09/27/19  4:45 PM   Specimen: BLOOD  Result Value Ref Range Status   Specimen Description BLOOD RIGHT ANTECUBITAL  Final   Special Requests   Final    BOTTLES DRAWN AEROBIC AND ANAEROBIC Blood Culture results may not be optimal due to an inadequate volume of blood received in culture bottles   Culture   Final    NO GROWTH 4 DAYS Performed at [facility], [place] [address], [place], [place] [address]    Report Status PENDING  Incomplete  Blood Culture (routine x 2)     Status: None (Preliminary result)   Collection Time: 09/27/19  4:50 PM   Specimen: BLOOD RIGHT HAND  Result Value Ref Range Status   Specimen Description BLOOD RIGHT HAND  Final   Special Requests   Final    BOTTLES DRAWN AEROBIC AND ANAEROBIC Blood Culture adequate volume   Culture   Final    NO GROWTH 4 DAYS Performed at [facility], [place] [address], [place], [name] [address]    Report Status PENDING  Incomplete  SARS Coronavirus 2 by RT PCR (hospital order, performed in [facility] hospital lab) Nasopharyngeal Nasopharyngeal Swab     Status: Abnormal    Collection Time: 09/27/19  9:17 PM  Specimen: Nasopharyngeal Swab  Result Value Ref Range Status   SARS Coronavirus 2 POSITIVE (A) NEGATIVE Final    Comment: RESULT CALLED TO, READ BACK BY AND VERIFIED WITH: B BECK RN 09/27/19 2255 JDW (NOTE) SARS-CoV-2 target nucleic acids are DETECTED SARS-CoV-2 RNA is generally detectable in upper respiratory specimens  during the acute phase of infection.  Positive results are indicative  of the presence of the identified virus, but do not rule out bacterial infection or co-infection with other pathogens not detected by the test.  Clinical correlation with patient history and  other diagnostic information is necessary to determine patient infection status.  The expected result is negative. Fact Sheet for Patients:   StrictlyIdeas.no  Fact Sheet for Healthcare Providers:   BankingDealers.co.za   This test is not yet approved or cleared by the [place] FDA and  has been authorized for detection and/or diagnosis of SARS-CoV-2 by FDA under an Emergency Use Authorization (EUA).  This EUA will remain in effect (meaning this test can be used)  for the duration of  the COVID-19 declaration under Section 564(b)(1) of the Act, 21 U.S.C. section 360-bbb-3(b)(1), unless the authorization is terminated or revoked sooner. Performed at [facility], [place] [address], [place], [place] [address]   MRSA PCR Screening     Status: Abnormal   Collection Time: 09/29/19 11:30 PM  Result Value Ref Range Status   MRSA by PCR POSITIVE (A) NEGATIVE Final    Comment:        The GeneXpert MRSA Assay (FDA approved for NASAL specimens only), is one component of a comprehensive MRSA colonization surveillance program. It is not intended to diagnose MRSA infection nor to guide or monitor treatment for MRSA infections. RESULT CALLED TO, READ BACK BY AND VERIFIED WITH: H PENG RRN 09/30/19 0231 JDW Performed at [facility], [address], [place], [place] [address]     Radiology Reports CT HEAD WO CONTRAST  Result Date: 09/27/2019 CLINICAL DATA:  Headache EXAM: CT HEAD WITHOUT CONTRAST TECHNIQUE: Contiguous axial images were obtained from the base of the skull through the vertex without intravenous contrast. COMPARISON:  CT brain 06/21/2019 FINDINGS: Brain: No evidence of acute infarction, hemorrhage, hydrocephalus, extra-axial collection or mass lesion/mass effect. Vascular: No hyperdense vessel or unexpected calcification. Skull: Normal. Negative for fracture or focal lesion. Sinuses/Orbits: Mucosal thickening in the ethmoid sinuses. Other: None. IMPRESSION: Negative non contrasted CT appearance of the brain.  Sinus disease. Electronically Signed   By: Donavan Foil M.D.   On: 09/27/2019 19:17   US Abdomen Complete  Result Date: 09/28/2019 CLINICAL DATA:  Acute generalized abdominal pain. EXAM: ABDOMEN ULTRASOUND COMPLETE COMPARISON:  Sep 26, 2019.  April 20, 2014. FINDINGS: Gallbladder: No gallstones or wall thickening visualized. No sonographic Murphy sign noted by sonographer. Common bile duct: Diameter: 13 mm which is unchanged compared to prior exam. No common bile duct stone is noted. Liver: No focal lesion identified. Within normal limits in parenchymal echogenicity. Portal vein is patent on color Doppler imaging with normal direction of blood flow towards the liver. IVC: No abnormality visualized. Pancreas: Visualized portion unremarkable. Spleen: Size and appearance within normal limits. Right Kidney: Length: 8.9 cm. 9 mm nonobstructive calculus seen in midpole of right kidney. Echogenicity within normal limits. No mass or hydronephrosis visualized. Left Kidney: Length: 9.7 cm. 1.4 cm nonobstructive calculus seen in lower pole. Echogenicity within normal limits. No mass or hydronephrosis visualized. Abdominal aorta: No aneurysm visualized. Other findings: None. IMPRESSION:  Bilateral nonobstructive  nephrolithiasis. No hydronephrosis or renal obstruction is noted. Stable dilatation of common bile duct which is unchanged compared to prior exams. No definite evidence of calculus is noted. Electronically Signed   By: Marijo Conception M.D.   On: 09/28/2019 09:20   DG Chest Portable 1 View  Result Date: 09/27/2019 CLINICAL DATA:  Chest pain EXAM: PORTABLE CHEST 1 VIEW COMPARISON:  August 27, 2018 FINDINGS: The heart size and mediastinal contours are within normal limits. No new consolidation or edema. No pleural effusion or pneumothorax. No new osseous abnormality. IMPRESSION: No acute process in the chest. Electronically Signed   By: Macy Mis M.D.   On: 09/27/2019 16:26   CT Renal Stone Study  Result Date: 09/26/2019 CLINICAL DATA:  Left flank pain for 3 days, on dialysis EXAM: CT ABDOMEN AND PELVIS WITHOUT CONTRAST TECHNIQUE: Multidetector CT imaging of the abdomen and pelvis was performed following the standard protocol without IV contrast. COMPARISON:  CT 04/01/2016 FINDINGS: Lower chest: Atelectatic changes present in the lung bases. Normal heart size. No pericardial effusion. Coronary artery calcifications are present. Hepatobiliary: No focal liver abnormality is seen within the limitations of this unenhanced CT. No gallstones, gallbladder wall thickening, or biliary dilatation. Pancreas: Unremarkable. No pancreatic ductal dilatation or surrounding inflammatory changes. Spleen: Normal in size without focal abnormality. Adrenals/Urinary Tract: Normal adrenal glands. Redemonstration of extensive calcification near the corticomedullary junctions which could reflect some medullary nephrocalcinosis versus urolithiasis. No frank hydronephrosis is seen however there are several hyperattenuating foci along the course of the distal ureter, larger distal calcification measuring up to 3 (5/83, 2/67). Thinner more elongated 2 mm calculus is seen just proximal (2/64, 5/85). No right ureteral calculi. Urinary  bladder is largely decompressed at the time of exam and therefore poorly evaluated by CT imaging. No gross bladder abnormality. Stomach/Bowel: Distal esophagus, stomach and duodenal sweep are unremarkable. No small bowel wall thickening or dilatation. No evidence of obstruction. A normal appendix is visualized. Much of the colon is fluid-filled without edematous mural thickening or distension. Vascular/Lymphatic: Atherosclerotic calcifications throughout the abdominal aorta and branch vessels. No aneurysm or ectasia. No enlarged abdominopelvic lymph nodes. Reproductive: The prostate and seminal vesicles are unremarkable. Other: No abdominopelvic free fluid or free gas. No bowel containing hernias. Musculoskeletal: Some focal increased attenuate about the vertebral body endplates may reflect early changes of renal osteodystrophy. No acute osseous abnormality or suspicious osseous lesion. IMPRESSION: 1. There at least 2 small calculi seen within the distal left ureter measuring up to 3 mm with only mild ureterectasis but no frank hydronephrosis. 2. More diffuse calcification about the corticomedullary junctions could reflect additional nonobstructing nephrolithiasis or medullary nephrocalcinosis. 3. Much of the colon is fluid-filled without edematous mural thickening or distension. Findings are nonspecific but can be seen with diarrheal illness. Correlate with patient's symptoms 4. Coronary artery calcifications. 5. Aortic Atherosclerosis (ICD10-I70.0). 6. 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 Electronically Signed   By: Lovena Le M.D.   On: 09/26/2019 19:53   CT Angio Chest/Abd/Pel for Dissection W and/or W/WO  Result Date: 09/27/2019 CLINICAL DATA:  Left flank pain, hypotensive EXAM: CT ANGIOGRAPHY CHEST, ABDOMEN AND PELVIS TECHNIQUE: Non-contrast CT of the chest was initially obtained. Multidetector CT imaging through the chest, abdomen and pelvis was performed using the standard protocol during  bolus administration of intravenous contrast. Multiplanar reconstructed images and MIPs were obtained and reviewed to evaluate the vascular anatomy. CONTRAST:  38m OMNIPAQUE IOHEXOL 350 MG/ML SOLN COMPARISON:  CT renal colic  09/26/2019 FINDINGS: CTA CHEST FINDINGS Cardiovascular: Noncontrast CT of the chest reveals no abnormal hyperdense mural thickening of the thoracic aorta to suggest intramural hematoma. Postcontrast administration there is satisfactory arterial phase enhancement. The aortic root and ascending aorta is suboptimally assessed given cardiac pulsation artifact. The aorta is normal caliber. No convincing features of dissection flap, minimal aortic injury, or other acute luminal abnormality of the aorta is seen. No periaortic stranding or hemorrhage. Minimal plaque seen within the aortic arch. The left vertebral artery arises directly from the aortic arch. Remaining proximal great vessels are unremarkable aside from minimal calcific plaque in the right subclavian artery. Central pulmonary arteries are normal caliber. Satisfactory pulmonary arterial opacification no central, lobar or segmental filling defects are identified. Normal heart size. No pericardial effusion. Mediastinum/Nodes: Calcified lymph nodes are present in the right hilar region. May reflect sequela of remote granulomatous disease. No worrisome mediastinal, hilar or axillary adenopathy. No mediastinal fluid or gas. Normal thyroid gland and thoracic inlet. No acute abnormality of the trachea or esophagus. Lungs/Pleura: Scattered multifocal areas of mixed ground-glass and consolidation throughout the lungs with a basilar and peripheral predominance on a background of more dependent atelectatic change. No pneumothorax or effusion. No convincing features of edema. No conspicuous pulmonary nodules or masses. Musculoskeletal: There is a highly comminuted fracture of the right scapula with separate fracture lines extending throughout the  supraspinous and infraspinous fossa and with extension through the medial and lateral borders as well as into the scapular spine near the base of the coracoid which is incompletely imaged on this exam. There is extensive overlying contusive change of the musculature of the posterior shoulder. There is a remote posttraumatic deformity of the distal left clavicle. No other acute or suspicious osseous lesions. Review of the MIP images confirms the above findings. CTA ABDOMEN AND PELVIS FINDINGS VASCULAR Aorta: Atherosclerotic plaque within the normal caliber aorta. No significant stenosis. No evidence of aneurysm, dissection or vasculitis. Celiac: Widely patent ostium. Calcified and noncalcified plaque within the splenic artery with minimal multifocal plaque narrowing but no other significant stenosis. No evidence of aneurysm, dissection or vasculitis. SMA: Normally opacified without evidence of aneurysm, dissection, vasculitis or significant stenosis. Renals: Paired renal arteries bilaterally. Renal arteries are patent without evidence of aneurysm, dissection, vasculitis or fibromuscular dysplasia. IMA: Mild narrowing at the IMA ostium. More distal vessel is normally opacified without significant stenosis. No evidence of aneurysm, dissection or vasculitis. Inflow: Plaque seen within the right common and internal iliac artery without significant flow-limiting stenosis. No evidence of aneurysm, dissection or vasculitis. Plaque seen throughout the proximal outflow vessels as well without significant stenosis. Veins: No obvious venous abnormality within the limitations of this arterial phase study. Review of the MIP images confirms the above findings. NON-VASCULAR Hepatobiliary: No focal liver abnormality is seen. No visible gallstones or gallbladder wall thickening. No pericholecystic fluid. Mild extrahepatic biliary ductal dilatation with the common bile duct measuring up to 9 mm. No visible intraductal gallstones.  Pancreas: Unremarkable. No pancreatic ductal dilatation or surrounding inflammatory changes. Spleen: Normal in size without focal abnormality. Adrenals/Urinary Tract: Normal adrenal glands. Redemonstration of the calcification in the corticomedullary junctions, possibly involving the papilla. Few do appear to reflect collecting system calculi however. Additionally, there is redemonstration of the punctate calcifications in the distal ureter which do not appear to have significantly changed from prior the more distal calcification measures up to 3 mm (7/267) while the more proximal calculus measures 2 mm (7/259). There is a lack of significant  urinary tract dilatation which could suggest these to be nonobstructive calculi. No concerning renal masses. No distal calcifications of the right urinary collecting system. Urinary bladder is largely decompressed at the time of exam and therefore poorly evaluated by CT imaging. Mild perivesicular hazy stranding. No other gross bladder abnormality. Stomach/Bowel: Stomach is within normal limits. Appendix appears normal. Question some mild diffuse bowel wall thickening but without frank distention or features of obstruction. Much of the colon also appears slightly thickened and fluid-filled. Appendix is fluid-filled but otherwise unremarkable without significant periappendiceal stranding. Lymphatic: No suspicious or enlarged lymph nodes in the included lymphatic chains. Reproductive: The prostate and seminal vesicles are unremarkable. Other: No abdominopelvic free air or fluid. Musculoskeletal: No suspicious osseous lesions. Increased attenuation towards the vertebral body endplates may suggest some degree of renal osteodystrophy. Review of the MIP images confirms the above findings. IMPRESSION: 1. No evidence of acute aortic syndrome or other acute luminal abnormality of the aorta. 2. Highly comminuted fracture of the right scapula with separate fracture lines extending  throughout the supraspinous and infraspinous fossa as well as into the scapular spine near the base of the coracoid which is incompletely imaged on this exam. Consider further evaluation with dedicated shoulder CT. 3. Scattered multifocal areas of mixed ground-glass and consolidation throughout the lungs with a basilar and peripheral predominance on a background of more dependent atelectatic change. Findings are favored to represent multifocal pneumonia or aspiration. Potential etiologies include atypical viral such as COVID-19. 4. Mild extrahepatic biliary ductal dilatation with the common bile duct measuring up to 9 mm. No visible intraductal gallstones. Recommend correlation with LFTs and if there is concern for biliary obstruction, right upper quadrant ultrasound could be obtained. 5. Redemonstration of the calcification in the corticomedullary junctions, possibly involving the papilla. While several of these may reflect nonobstructive calculi. 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 6. Punctate calculi again seen in the distal left ureter without significant migration from the comparison. There is a lack of significant urinary tract dilatation which could suggest these to be only partially obstructive. 7. Some mild diffuse large and small bowel thickening with a fluid-filled colon. Findings are nonspecific, and may represent a diarrheal illness/enterocolitis. These results were called by telephone at the time of interpretation on 09/27/2019 at 9:25 pm to provider [facility] , who verbally acknowledged these results. Electronically Signed   By: Lovena Le M.D.   On: 09/27/2019 21:25

## 2019-10-02 DIAGNOSIS — Z992 Dependence on renal dialysis: Secondary | ICD-10-CM

## 2019-10-02 LAB — COMPREHENSIVE METABOLIC PANEL
ALT: 38 U/L (ref 0–44)
AST: 55 U/L — ABNORMAL HIGH (ref 15–41)
Albumin: 2.7 g/dL — ABNORMAL LOW (ref 3.5–5.0)
Alkaline Phosphatase: 161 U/L — ABNORMAL HIGH (ref 38–126)
Anion gap: 19 — ABNORMAL HIGH (ref 5–15)
BUN: 39 mg/dL — ABNORMAL HIGH (ref 6–20)
CO2: 23 mmol/L (ref 22–32)
Calcium: 8.9 mg/dL (ref 8.9–10.3)
Chloride: 97 mmol/L — ABNORMAL LOW (ref 98–111)
Creatinine, Ser: 8.68 mg/dL — ABNORMAL HIGH (ref 0.61–1.24)
GFR calc Af Amer: 8 mL/min — ABNORMAL LOW (ref >60)
GFR calc non Af Amer: 6 mL/min — ABNORMAL LOW (ref >60)
Glucose, Bld: 146 mg/dL — ABNORMAL HIGH (ref 70–99)
Potassium: 4.2 mmol/L (ref 3.5–5.1)
Sodium: 139 mmol/L (ref 135–145)
Total Bilirubin: 1.5 mg/dL — ABNORMAL HIGH (ref 0.3–1.2)
Total Protein: 7.7 g/dL (ref 6.5–8.1)

## 2019-10-02 LAB — CULTURE, BLOOD (ROUTINE X 2)
Culture: NO GROWTH
Culture: NO GROWTH
Special Requests: ADEQUATE

## 2019-10-02 LAB — CBC
HCT: 33.7 % — ABNORMAL LOW (ref 39.0–52.0)
Hemoglobin: 11 g/dL — ABNORMAL LOW (ref 13.0–17.0)
MCH: 33.5 pg (ref 26.0–34.0)
MCHC: 32.6 g/dL (ref 30.0–36.0)
MCV: 102.7 fL — ABNORMAL HIGH (ref 80.0–100.0)
Platelets: 174 10*3/uL (ref 150–400)
RBC: 3.28 MIL/uL — ABNORMAL LOW (ref 4.22–5.81)
RDW: 14.2 % (ref 11.5–15.5)
WBC: 4.8 10*3/uL (ref 4.0–10.5)
nRBC: 0 % (ref 0.0–0.2)

## 2019-10-02 LAB — FERRITIN: Ferritin: 6277 ng/mL — ABNORMAL HIGH (ref 24–336)

## 2019-10-02 LAB — GLUCOSE, CAPILLARY: Glucose-Capillary: 142 mg/dL — ABNORMAL HIGH (ref 70–99)

## 2019-10-02 LAB — C-REACTIVE PROTEIN: CRP: 11.5 mg/dL — ABNORMAL HIGH (ref <1.0)

## 2019-10-02 LAB — D-DIMER, QUANTITATIVE: D-Dimer, Quant: 0.94 ug/mL-FEU — ABNORMAL HIGH (ref 0.00–0.50)

## 2019-10-02 NOTE — Discharge Summary (Signed)
Alvin Daniels DXA:[id number] DOB: Aug 12, 1970 DOA: 09/27/2019  PCP: Camillia Herter, NP  Admit date: 09/27/2019  Discharge date: 10/02/2019  Admitted From: Home   Disposition:  Home   Recommendations for Outpatient Follow-up:   Follow up with PCP in 1-2 weeks  PCP Please obtain BMP/CBC, 2 view CXR in 1week,  (see Discharge instructions)   PCP Please follow up on the following pending results: Review CT results in detail, needs outpatient orthopedic follow-up in 7 to 10 days.  Also needs outpatient GI follow-up in 2 to 3 weeks.   Home Health: PT,PT   Equipment/Devices: None  Consultations: Ortho Discharge Condition: Stable    CODE STATUS: Full    Diet Recommendation: Heart Healthy Low Carb    Chief Complaint  Patient presents with   Chest Pain   Back Pain     Brief history of present illness from the day of admission and additional interim summary    Patient is a 49 y.o. male with PMHx of ESRD on HD MWF, HIV on antiretrovirals, bilateral BKA, DM-2, HTN-who sustained a mechanical fall approximately a week prior to this hospitalization-presented to the ED with right-sided chest pain-he was found to have right scapular fracture, and also found to have COVID-19 pneumonia.  See below for further details.  Significant Events: 5/17>> admit to [facility] for right scapula fracture/Covid pneumonia.  COVID-19 medications: Remdesivir: 5/17>>  Antibiotics: Rocephin: 5/17>> 5/18 Zithromax: 5/17>> 5/18  Microbiology data: 5/17: Blood culture>> no growth  Significant studies: 5/17: CT angiogram chest/abdomen/pelvis>> highly comminuted right scapular fracture, scattered multi focal groundglass opacities, mild extrahepatic biliary ductal dilatation.  No evidence of acute aortic syndrome.                                                 Hospital Course    Covid 19 Viral pneumonia: Stable-not hypoxic-CRP still significantly elevated (could be scapular fracture)- he had mild disease and had no hypoxia was treated with IV remdesivir only finishing course of treatment today, completely asymptomatic on room air and will be discharged home after 2 days remdesivir.  SpO2: 100 %  Recent Labs  Lab 09/27/19 2117 09/28/19 0037 09/28/19 0037 09/28/19 0641 09/29/19 1257 09/30/19 0616 10/01/19 0400 10/02/19 0717  CRP  --  8.6*   < > 9.2* 12.9* 12.0* 12.2* 11.5*  DDIMER  --   --   --  1.54* 1.84* 1.40* 0.99* 0.94*  FERRITIN  --  4,051*  --   --  >7,500* >7,500* 6,541* 6,277*  PROCALCITON  --  0.29  --  0.36  --   --   --   --   SARSCOV2NAA POSITIVE*  --   --   --   --   --   --   --    < > = values in this interval not displayed.    Right scapular fracture: Following a  mechanical fall-evaluated by orthopedics-Dr. Landau-recommendations are for sling management, nonweightbearing to right upper extremity-okay to use wrist/hands.  Follow with orthopedics in 2 weeks.  PT/OT consult appreciated-recommendations are for home health.  ESRD on HD MWF: Nephrology following and directing care.  Per patient report-he always gets eye pain with hemodialysis.  HIV: Continue antiretrovirals  Hypotension: Etiology uncertain-no indication of sepsis or blood loss-probably related to?  Narcotics administered in the ED-also seems to be a chronic issue-on midodrine on days of dialysis-continue with midodrine.  He is asymptomatic.  No further work-up required.  Thrombocytopenia: Appears to be mild-likely secondary to COVID-19-follow.  Extrahepatic biliary dilatation: Has transaminitis which could be from COVID-19-alk phos/bilirubin not significantly elevated-RUQ ultrasound without any major abnormalities.  Suspect stable for outpatient follow-up and monitoring.  Request PCP to arrange for one-time  outpatient GI follow-up.  Anemia: Secondary to chronic disease-ESRD/HIV-stable for follow-up.  No indication of blood loss.  History of bilateral BKA: Ambulates with prosthesis  Insulin-dependent DM-2 (A1c on 09/28/2018 6.8):  Continue home regimen and follow with PCP.    Discharge diagnosis     Principal Problem:   Chest pain Active Problems:   Diabetes mellitus with ESRD (end-stage renal disease) ([facility])   ESRD on dialysis ([place])   HIV (human immunodeficiency virus infection) ([place])   Scapular fracture   COVID-19 virus infection    Discharge instructions    Discharge Instructions    Diet - low sodium heart healthy   Complete by: As directed    Discharge instructions   Complete by: As directed    Follow with Primary MD Camillia Herter, NP in 7 days   Get CBC, CMP, 2 view Chest X ray -  checked next visit within 1 week by Primary MD   Activity: Wear sling in the affected Right arm/shoulder, nonweightbearing to right upper extremity-okay to use wrist/hands.    Disposition Home   Diet: Heart Healthy Low Carb  Special Instructions: If you have smoked or chewed Tobacco  in the last 2 yrs please stop smoking, stop any regular Alcohol  and or any Recreational drug use.  On your next visit with your primary care physician please Get Medicines reviewed and adjusted.  Please request your Prim.MD to go over all Hospital Tests and Procedure/Radiological results at the follow up, please get all Hospital records sent to your Prim MD by signing hospital release before you go home.  If you experience worsening of your admission symptoms, develop shortness of breath, life threatening emergency, suicidal or homicidal thoughts you must seek medical attention immediately by calling 911 or calling your MD immediately  if symptoms less severe.  You Must read complete instructions/literature along with all the possible adverse reactions/side effects for all the Medicines you take and that  have been prescribed to you. Take any new Medicines after you have completely understood and accpet all the possible adverse reactions/side effects.   Increase activity slowly   Complete by: As directed    MyChart COVID-19 home monitoring program   Complete by: Oct 02, 2019    Is the patient willing to use the [facility] for home monitoring?: Yes   Temperature monitoring   Complete by: Oct 02, 2019    After how many days would you like to receive a notification of this patient's flowsheet entries?: 1      Discharge Medications   Allergies as of 10/02/2019   No Known Allergies     Medication List    STOP  taking these medications   acetaminophen 325 MG tablet Commonly known as: TYLENOL   docusate sodium 100 MG capsule Commonly known as: COLACE   feeding supplement (NEPRO CARB STEADY) Liqd   feeding supplement (PRO-STAT SUGAR FREE 64) Liqd   ondansetron 4 MG disintegrating tablet Commonly known as: Zofran ODT     TAKE these medications   Biktarvy 50-200-25 MG Tabs tablet Generic drug: bictegravir-emtricitabine-tenofovir AF Take 1 tablet by mouth daily.   glucose blood test strip Commonly known as: True Metrix Blood Glucose Test 1 each by Other route 3 (three) times daily.   HYDROcodone-acetaminophen 5-325 MG tablet Commonly known as: NORCO/VICODIN Take 1-2 tablets by mouth every 4 (four) hours as needed.   insulin NPH-regular Human (70-30) 100 UNIT/ML injection Inject 15 Units into the skin 2 (two) times daily with a meal.   Insulin Syringe-Needle U-100 31G X 15/64" 0.5 ML Misc Commonly known as: BD Insulin Syringe Ultrafine 1 each by Does not apply route 2 (two) times daily. Please dispense needles as well.   metaxalone 800 MG tablet Commonly known as: SKELAXIN Take 1 tablet (800 mg total) by mouth 3 (three) times daily.   midodrine 10 MG tablet Commonly known as: PROAMATINE Take 1 tablet (10 mg total) by mouth Every Tuesday,Thursday,and Saturday with  dialysis.   True Metrix Meter Devi 1 each by Does not apply route 3 (three) times daily.   TRUEplus Lancets 28G Misc 1 each by Does not apply route 3 (three) times daily.       Follow-up Information    Marchia Bond, MD. Schedule an appointment as soon as possible for a visit in 3 week(s).   Specialty: Orthopedic Surgery Contact information: [address] [place] [address] [phone]        [facility] Follow up.   Specialty: Home Health Services Why: For home physical therapy. They will call you in 1-2 days to set up your first home appointment.  Contact information: [place] [address] [phone]        Camillia Herter, NP. Schedule an appointment as soon as possible for a visit in 1 week(s).   Specialty: Nurse Practitioner Contact information: [address] [place] [address] [phone]           Major procedures and Radiology Reports - PLEASE review detailed and final reports thoroughly  -        CT HEAD WO CONTRAST  Result Date: 09/27/2019 CLINICAL DATA:  Headache EXAM: CT HEAD WITHOUT CONTRAST TECHNIQUE: Contiguous axial images were obtained from the base of the skull through the vertex without intravenous contrast. COMPARISON:  CT brain 06/21/2019 FINDINGS: Brain: No evidence of acute infarction, hemorrhage, hydrocephalus, extra-axial collection or mass lesion/mass effect. Vascular: No hyperdense vessel or unexpected calcification. Skull: Normal. Negative for fracture or focal lesion. Sinuses/Orbits: Mucosal thickening in the ethmoid sinuses. Other: None. IMPRESSION: Negative non contrasted CT appearance of the brain.  Sinus disease. Electronically Signed   By: Donavan Foil M.D.   On: 09/27/2019 19:17   US Abdomen Complete  Result Date: 09/28/2019 CLINICAL DATA:  Acute generalized abdominal pain. EXAM: ABDOMEN ULTRASOUND COMPLETE COMPARISON:  Sep 26, 2019.  April 20, 2014. FINDINGS: Gallbladder: No gallstones or wall thickening visualized. No sonographic Murphy sign noted by sonographer. Common bile duct: Diameter: 13 mm which is unchanged compared to prior exam. No common bile duct stone is noted. Liver: No focal lesion identified.  Within normal limits in parenchymal echogenicity. Portal vein is patent on color Doppler imaging with normal direction of blood flow towards the liver. IVC: No abnormality visualized. Pancreas: Visualized portion unremarkable. Spleen: Size and appearance within normal limits. Right Kidney: Length: 8.9 cm. 9 mm nonobstructive calculus seen in midpole of right kidney. Echogenicity within normal limits. No mass or hydronephrosis visualized. Left Kidney: Length: 9.7 cm. 1.4 cm nonobstructive calculus seen in lower pole. Echogenicity within normal limits. No mass or hydronephrosis visualized. Abdominal aorta: No aneurysm visualized. Other findings: None. IMPRESSION: Bilateral nonobstructive nephrolithiasis. No hydronephrosis or renal obstruction is noted. Stable dilatation of common bile duct which is unchanged compared to prior exams. No definite evidence of calculus is noted. Electronically Signed   By: Marijo Conception M.D.   On: 09/28/2019 09:20   DG Chest Portable 1 View  Result Date: 09/27/2019 CLINICAL DATA:  Chest pain EXAM: PORTABLE CHEST 1 VIEW COMPARISON:  August 27, 2018 FINDINGS: The heart size and mediastinal contours are within normal limits. No new consolidation or edema. No pleural effusion or pneumothorax. No new osseous abnormality. IMPRESSION: No acute process in the chest. Electronically Signed   By: Macy Mis M.D.   On: 09/27/2019 16:26   CT Renal Stone Study  Result Date: 09/26/2019 CLINICAL DATA:  Left flank pain for 3 days, on dialysis EXAM: CT ABDOMEN AND PELVIS WITHOUT CONTRAST TECHNIQUE: Multidetector CT imaging of the abdomen and pelvis was performed following the standard protocol without IV contrast. COMPARISON:   CT 04/01/2016 FINDINGS: Lower chest: Atelectatic changes present in the lung bases. Normal heart size. No pericardial effusion. Coronary artery calcifications are present. Hepatobiliary: No focal liver abnormality is seen within the limitations of this unenhanced CT. No gallstones, gallbladder wall thickening, or biliary dilatation. Pancreas: Unremarkable. No pancreatic ductal dilatation or surrounding inflammatory changes. Spleen: Normal in size without focal abnormality. Adrenals/Urinary Tract: Normal adrenal glands. Redemonstration of extensive calcification near the corticomedullary junctions which could reflect some medullary nephrocalcinosis versus urolithiasis. No frank hydronephrosis is seen however there are several hyperattenuating foci along the course of the distal ureter, larger distal calcification measuring up to 3 (5/83, 2/67). Thinner more elongated 2 mm calculus is seen just proximal (2/64, 5/85). No right ureteral calculi. Urinary bladder is largely decompressed at the time of exam and therefore poorly evaluated by CT imaging. No gross bladder abnormality. Stomach/Bowel: Distal esophagus, stomach and duodenal sweep are unremarkable. No small bowel wall thickening or dilatation. No evidence of obstruction. A normal appendix is visualized. Much of the colon is fluid-filled without edematous mural thickening or distension. Vascular/Lymphatic: Atherosclerotic calcifications throughout the abdominal aorta and branch vessels. No aneurysm or ectasia. No enlarged abdominopelvic lymph nodes. Reproductive: The prostate and seminal vesicles are unremarkable. Other: No abdominopelvic free fluid or free gas. No bowel containing hernias. Musculoskeletal: Some focal increased attenuate about the vertebral body endplates may reflect early changes of renal osteodystrophy. No acute osseous abnormality or suspicious osseous lesion. IMPRESSION: 1. There at least 2 small calculi seen within the distal left ureter  measuring up to 3 mm with only mild ureterectasis but no frank hydronephrosis. 2. More diffuse calcification about the corticomedullary junctions could reflect additional nonobstructing nephrolithiasis or medullary nephrocalcinosis. 3. Much of the colon is fluid-filled without edematous mural thickening or distension. Findings are nonspecific but can be seen with diarrheal illness. Correlate with patient's symptoms 4. Coronary artery calcifications. 5. Aortic Atherosclerosis (ICD10-I70.0). 6. 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 Electronically Signed   By: March Rummage  [facility] M.D.   On: 09/26/2019 19:53   CT Angio Chest/Abd/Pel for Dissection W and/or W/WO  Result Date: 09/27/2019 CLINICAL DATA:  Left flank pain, hypotensive EXAM: CT ANGIOGRAPHY CHEST, ABDOMEN AND PELVIS TECHNIQUE: Non-contrast CT of the chest was initially obtained. Multidetector CT imaging through the chest, abdomen and pelvis was performed using the standard protocol during bolus administration of intravenous contrast. Multiplanar reconstructed images and MIPs were obtained and reviewed to evaluate the vascular anatomy. CONTRAST:  65m OMNIPAQUE IOHEXOL 350 MG/ML SOLN COMPARISON:  CT renal colic 090/30/0923FINDINGS: CTA CHEST FINDINGS Cardiovascular: Noncontrast CT of the chest reveals no abnormal hyperdense mural thickening of the thoracic aorta to suggest intramural hematoma. Postcontrast administration there is satisfactory arterial phase enhancement. The aortic root and ascending aorta is suboptimally assessed given cardiac pulsation artifact. The aorta is normal caliber. No convincing features of dissection flap, minimal aortic injury, or other acute luminal abnormality of the aorta is seen. No periaortic stranding or hemorrhage. Minimal plaque seen within the aortic arch. The left vertebral artery arises directly from the aortic arch. Remaining proximal great vessels are unremarkable aside from minimal calcific plaque in the right  subclavian artery. Central pulmonary arteries are normal caliber. Satisfactory pulmonary arterial opacification no central, lobar or segmental filling defects are identified. Normal heart size. No pericardial effusion. Mediastinum/Nodes: Calcified lymph nodes are present in the right hilar region. May reflect sequela of remote granulomatous disease. No worrisome mediastinal, hilar or axillary adenopathy. No mediastinal fluid or gas. Normal thyroid gland and thoracic inlet. No acute abnormality of the trachea or esophagus. Lungs/Pleura: Scattered multifocal areas of mixed ground-glass and consolidation throughout the lungs with a basilar and peripheral predominance on a background of more dependent atelectatic change. No pneumothorax or effusion. No convincing features of edema. No conspicuous pulmonary nodules or masses. Musculoskeletal: There is a highly comminuted fracture of the right scapula with separate fracture lines extending throughout the supraspinous and infraspinous fossa and with extension through the medial and lateral borders as well as into the scapular spine near the base of the coracoid which is incompletely imaged on this exam. There is extensive overlying contusive change of the musculature of the posterior shoulder. There is a remote posttraumatic deformity of the distal left clavicle. No other acute or suspicious osseous lesions. Review of the MIP images confirms the above findings. CTA ABDOMEN AND PELVIS FINDINGS VASCULAR Aorta: Atherosclerotic plaque within the normal caliber aorta. No significant stenosis. No evidence of aneurysm, dissection or vasculitis. Celiac: Widely patent ostium. Calcified and noncalcified plaque within the splenic artery with minimal multifocal plaque narrowing but no other significant stenosis. No evidence of aneurysm, dissection or vasculitis. SMA: Normally opacified without evidence of aneurysm, dissection, vasculitis or significant stenosis. Renals: Paired renal  arteries bilaterally. Renal arteries are patent without evidence of aneurysm, dissection, vasculitis or fibromuscular dysplasia. IMA: Mild narrowing at the IMA ostium. More distal vessel is normally opacified without significant stenosis. No evidence of aneurysm, dissection or vasculitis. Inflow: Plaque seen within the right common and internal iliac artery without significant flow-limiting stenosis. No evidence of aneurysm, dissection or vasculitis. Plaque seen throughout the proximal outflow vessels as well without significant stenosis. Veins: No obvious venous abnormality within the limitations of this arterial phase study. Review of the MIP images confirms the above findings. NON-VASCULAR Hepatobiliary: No focal liver abnormality is seen. No visible gallstones or gallbladder wall thickening. No pericholecystic fluid. Mild extrahepatic biliary ductal dilatation with the common bile duct measuring up to 9 mm. No visible  intraductal gallstones. Pancreas: Unremarkable. No pancreatic ductal dilatation or surrounding inflammatory changes. Spleen: Normal in size without focal abnormality. Adrenals/Urinary Tract: Normal adrenal glands. Redemonstration of the calcification in the corticomedullary junctions, possibly involving the papilla. Few do appear to reflect collecting system calculi however. Additionally, there is redemonstration of the punctate calcifications in the distal ureter which do not appear to have significantly changed from prior the more distal calcification measures up to 3 mm (7/267) while the more proximal calculus measures 2 mm (7/259). There is a lack of significant urinary tract dilatation which could suggest these to be nonobstructive calculi. No concerning renal masses. No distal calcifications of the right urinary collecting system. Urinary bladder is largely decompressed at the time of exam and therefore poorly evaluated by CT imaging. Mild perivesicular hazy stranding. No other gross bladder  abnormality. Stomach/Bowel: Stomach is within normal limits. Appendix appears normal. Question some mild diffuse bowel wall thickening but without frank distention or features of obstruction. Much of the colon also appears slightly thickened and fluid-filled. Appendix is fluid-filled but otherwise unremarkable without significant periappendiceal stranding. Lymphatic: No suspicious or enlarged lymph nodes in the included lymphatic chains. Reproductive: The prostate and seminal vesicles are unremarkable. Other: No abdominopelvic free air or fluid. Musculoskeletal: No suspicious osseous lesions. Increased attenuation towards the vertebral body endplates may suggest some degree of renal osteodystrophy. Review of the MIP images confirms the above findings. IMPRESSION: 1. No evidence of acute aortic syndrome or other acute luminal abnormality of the aorta. 2. Highly comminuted fracture of the right scapula with separate fracture lines extending throughout the supraspinous and infraspinous fossa as well as into the scapular spine near the base of the coracoid which is incompletely imaged on this exam. Consider further evaluation with dedicated shoulder CT. 3. Scattered multifocal areas of mixed ground-glass and consolidation throughout the lungs with a basilar and peripheral predominance on a background of more dependent atelectatic change. Findings are favored to represent multifocal pneumonia or aspiration. Potential etiologies include atypical viral such as COVID-19. 4. Mild extrahepatic biliary ductal dilatation with the common bile duct measuring up to 9 mm. No visible intraductal gallstones. Recommend correlation with LFTs and if there is concern for biliary obstruction, right upper quadrant ultrasound could be obtained. 5. Redemonstration of the calcification in the corticomedullary junctions, possibly involving the papilla. While several of these may reflect nonobstructive calculi. Alternative etiologies for this  calcification would include medullary nephrocalcinosis or potential chronic sequela from prior papillary necrosis. 6. Punctate calculi again seen in the distal left ureter without significant migration from the comparison. There is a lack of significant urinary tract dilatation which could suggest these to be only partially obstructive. 7. Some mild diffuse large and small bowel thickening with a fluid-filled colon. Findings are nonspecific, and may represent a diarrheal illness/enterocolitis. These results were called by telephone at the time of interpretation on 09/27/2019 at 9:25 pm to provider [facility] , who verbally acknowledged these results. Electronically Signed   By: Lovena Le M.D.   On: 09/27/2019 21:25    Micro Results     Recent Results (from the past 240 hour(s))  Blood Culture (routine x 2)     Status: None   Collection Time: 09/27/19  4:45 PM   Specimen: BLOOD  Result Value Ref Range Status   Specimen Description BLOOD RIGHT ANTECUBITAL  Final   Special Requests   Final    BOTTLES DRAWN AEROBIC AND ANAEROBIC Blood Culture results may not be optimal due to  an inadequate volume of blood received in culture bottles   Culture   Final    NO GROWTH 5 DAYS Performed at [facility], [place] [address] [place], [place] [address]    Report Status 10/02/2019 FINAL  Final  Blood Culture (routine x 2)     Status: None   Collection Time: 09/27/19  4:50 PM   Specimen: BLOOD RIGHT HAND  Result Value Ref Range Status   Specimen Description BLOOD RIGHT HAND  Final   Special Requests   Final    BOTTLES DRAWN AEROBIC AND ANAEROBIC Blood Culture adequate volume   Culture   Final    NO GROWTH 5 DAYS Performed at [facility], [place] [address] [place], [place] [address]    Report Status 10/02/2019 FINAL  Final  SARS Coronavirus 2 by RT PCR (hospital order, performed in [facility] hospital lab) Nasopharyngeal Nasopharyngeal Swab     Status: Abnormal   Collection Time: 09/27/19   9:17 PM   Specimen: Nasopharyngeal Swab  Result Value Ref Range Status   SARS Coronavirus 2 POSITIVE (A) NEGATIVE Final    Comment: RESULT CALLED TO, READ BACK BY AND VERIFIED WITH: B BECK RN 09/27/19 2255 JDW (NOTE) SARS-CoV-2 target nucleic acids are DETECTED SARS-CoV-2 RNA is generally detectable in upper respiratory specimens  during the acute phase of infection.  Positive results are indicative  of the presence of the identified virus, but do not rule out bacterial infection or co-infection with other pathogens not detected by the test.  Clinical correlation with patient history and  other diagnostic information is necessary to determine patient infection status.  The expected result is negative. Fact Sheet for Patients:   StrictlyIdeas.no  Fact Sheet for Healthcare Providers:   BankingDealers.co.za   This test is not yet approved or cleared by the [place] FDA and  has been authorized for detection and/or diagnosis of SARS-CoV-2 by FDA under an Emergency Use Authorization (EUA).  This EUA will remain in effect (meaning this test can be used)  for the duration of  the COVID-19 declaration under Section 564(b)(1) of the Act, 21 U.S.C. section 360-bbb-3(b)(1), unless the authorization is terminated or revoked sooner. Performed at [facility], [place] [address], [place], [place] [address]   MRSA PCR Screening     Status: Abnormal   Collection Time: 09/29/19 11:30 PM  Result Value Ref Range Status   MRSA by PCR POSITIVE (A) NEGATIVE Final    Comment:        The GeneXpert MRSA Assay (FDA approved for NASAL specimens only), is one component of a comprehensive MRSA colonization surveillance program. It is not intended to diagnose MRSA infection nor to guide or monitor treatment for MRSA infections. RESULT CALLED TO, READ BACK BY AND VERIFIED WITH: H PENG RRN 09/30/19 0231 JDW Performed at [facility], [address] [place], [place] [address]     Today   Subjective    Zaiah Ashford today has no headache,no chest abdominal pain,no new weakness tingling or numbness, feels much better wants to go home today.    Objective   Blood pressure 131/79, pulse 87, temperature 98.6 F (37 C), temperature source Oral, resp. rate 20, height 5' (1.524 m), weight 52.4 kg, SpO2 100 %.   Intake/Output Summary (Last 24 hours) at 10/02/2019 1035 Last data filed at 10/01/2019 2225 Gross per 24 hour  Intake 50 ml  Output -50 ml  Net 100 ml  Exam  Awake Alert, No new F.N deficits, Normal affect [place].AT,PERRAL Supple Neck,No JVD, No cervical lymphadenopathy appriciated.  Symmetrical Chest wall movement, Good air movement bilaterally, CTAB RRR,No Gallops,Rubs or new Murmurs, No Parasternal Heave +ve B.Sounds, Abd Soft, Non tender, No organomegaly appriciated, No rebound -guarding or rigidity. No Cyanosis, Clubbing or edema, No new Rash or bruise   Data Review   CBC w Diff:  Lab Results  Component Value Date   WBC 4.8 10/02/2019   HGB 11.0 (L) 10/02/2019   HCT 33.7 (L) 10/02/2019   PLT 174 10/02/2019   LYMPHOPCT 20 09/28/2019   MONOPCT 8 09/28/2019   EOSPCT 1 09/28/2019   BASOPCT 0 09/28/2019    CMP:  Lab Results  Component Value Date   NA 139 10/02/2019   K 4.2 10/02/2019   CL 97 (L) 10/02/2019   CO2 23 10/02/2019   BUN 39 (H) 10/02/2019   CREATININE 8.68 (H) 10/02/2019   CREATININE 6.19 (H) 05/15/2017   PROT 7.7 10/02/2019   ALBUMIN 2.7 (L) 10/02/2019   BILITOT 1.5 (H) 10/02/2019   ALKPHOS 161 (H) 10/02/2019   AST 55 (H) 10/02/2019   ALT 38 10/02/2019  .   Total Time in preparing paper work, data evaluation and todays exam - 37 minutes  Lala Lund M.D on 10/02/2019 at 10:35 AM  [facility]   Office  [phone]

## 2019-10-02 NOTE — Discharge Instructions (Signed)
Follow with Primary MD Camillia Herter, NP in 7 days   Get CBC, CMP, 2 view Chest X ray -  checked next visit within 1 week by Primary MD   Activity: Wear sling in the affected Right arm/shoulder, nonweightbearing to right upper extremity-okay to use wrist/hands.   Disposition Home   Diet: Heart Healthy Low Carb  Special Instructions: If you have smoked or chewed Tobacco  in the last 2 yrs please stop smoking, stop any regular Alcohol  and or any Recreational drug use.  On your next visit with your primary care physician please Get Medicines reviewed and adjusted.  Please request your Prim.MD to go over all Hospital Tests and Procedure/Radiological results at the follow up, please get all Hospital records sent to your Prim MD by signing hospital release before you go home.  If you experience worsening of your admission symptoms, develop shortness of breath, life threatening emergency, suicidal or homicidal thoughts you must seek medical attention immediately by calling 911 or calling your MD immediately  if symptoms less severe.  You Must read complete instructions/literature along with all the possible adverse reactions/side effects for all the Medicines you take and that have been prescribed to you. Take any new Medicines after you have completely understood and accpet all the possible adverse reactions/side effects.       Person Under Monitoring Name: Alvin Daniels  Location: [place] [address]   Infection Prevention Recommendations for Individuals Confirmed to have, or Being Evaluated for, 2019 Novel Coronavirus (COVID-19) Infection Who Receive Care at Home  Individuals who are confirmed to have, or are being evaluated for, COVID-19 should follow the prevention steps below until a healthcare provider or local or state health department says they can return to normal activities.  Stay home except to get medical care You should restrict activities  outside your home, except for getting medical care. Do not go to work, school, or public areas, and do not use public transportation or taxis.  Call ahead before visiting your doctor Before your medical appointment, call the healthcare provider and tell them that you have, or are being evaluated for, COVID-19 infection. This will help the healthcare provider's office take steps to keep other people from getting infected. Ask your healthcare provider to call the local or state health department.  Monitor your symptoms Seek prompt medical attention if your illness is worsening (e.g., difficulty breathing). Before going to your medical appointment, call the healthcare provider and tell them that you have, or are being evaluated for, COVID-19 infection. Ask your healthcare provider to call the local or state health department.  Wear a facemask You should wear a facemask that covers your nose and mouth when you are in the same room with other people and when you visit a healthcare provider. People who live with or visit you should also wear a facemask while they are in the same room with you.  Separate yourself from other people in your home As much as possible, you should stay in a different room from other people in your home. Also, you should use a separate bathroom, if available.  Avoid sharing household items You should not share dishes, drinking glasses, cups, eating utensils, towels, bedding, or other items with other people in your home. After using these items, you should wash them thoroughly with soap and water.  Cover your coughs and sneezes Cover your mouth and nose with a tissue when you cough or sneeze, or you can  cough or sneeze into your sleeve. Throw used tissues in a lined trash can, and immediately wash your hands with soap and water for at least 20 seconds or use an alcohol-based hand rub.  Wash your [facility] your hands often and thoroughly with soap and water for at  least 20 seconds. You can use an alcohol-based hand sanitizer if soap and water are not available and if your hands are not visibly dirty. Avoid touching your eyes, nose, and mouth with unwashed hands.   Prevention Steps for Caregivers and Household Members of Individuals Confirmed to have, or Being Evaluated for, COVID-19 Infection Being Cared for in the Home  If you live with, or provide care at home for, a person confirmed to have, or being evaluated for, COVID-19 infection please follow these guidelines to prevent infection:  Follow healthcare provider's instructions Make sure that you understand and can help the patient follow any healthcare provider instructions for all care.  Provide for the patient's basic needs You should help the patient with basic needs in the home and provide support for getting groceries, prescriptions, and other personal needs.  Monitor the patient's symptoms If they are getting sicker, call his or her medical provider and tell them that the patient has, or is being evaluated for, COVID-19 infection. This will help the healthcare provider's office take steps to keep other people from getting infected. Ask the healthcare provider to call the local or state health department.  Limit the number of people who have contact with the patient  If possible, have only one caregiver for the patient.  Other household members should stay in another home or place of residence. If this is not possible, they should stay  in another room, or be separated from the patient as much as possible. Use a separate bathroom, if available.  Restrict visitors who do not have an essential need to be in the home.  Keep older adults, very young children, and other sick people away from the patient Keep older adults, very young children, and those who have compromised immune systems or chronic health conditions away from the patient. This includes people with chronic heart, lung, or  kidney conditions, diabetes, and cancer.  Ensure good ventilation Make sure that shared spaces in the home have good air flow, such as from an air conditioner or an opened window, weather permitting.  Wash your hands often  Wash your hands often and thoroughly with soap and water for at least 20 seconds. You can use an alcohol based hand sanitizer if soap and water are not available and if your hands are not visibly dirty.  Avoid touching your eyes, nose, and mouth with unwashed hands.  Use disposable paper towels to dry your hands. If not available, use dedicated cloth towels and replace them when they become wet.  Wear a facemask and gloves  Wear a disposable facemask at all times in the room and gloves when you touch or have contact with the patient's blood, body fluids, and/or secretions or excretions, such as sweat, saliva, sputum, nasal mucus, vomit, urine, or feces.  Ensure the mask fits over your nose and mouth tightly, and do not touch it during use.  Throw out disposable facemasks and gloves after using them. Do not reuse.  Wash your hands immediately after removing your facemask and gloves.  If your personal clothing becomes contaminated, carefully remove clothing and launder. Wash your hands after handling contaminated clothing.  Place all used disposable facemasks, gloves, and  other waste in a lined container before disposing them with other household waste.  Remove gloves and wash your hands immediately after handling these items.  Do not share dishes, glasses, or other household items with the patient  Avoid sharing household items. You should not share dishes, drinking glasses, cups, eating utensils, towels, bedding, or other items with a patient who is confirmed to have, or being evaluated for, COVID-19 infection.  After the person uses these items, you should wash them thoroughly with soap and water.  Wash laundry thoroughly  Immediately remove and wash clothes  or bedding that have blood, body fluids, and/or secretions or excretions, such as sweat, saliva, sputum, nasal mucus, vomit, urine, or feces, on them.  Wear gloves when handling laundry from the patient.  Read and follow directions on labels of laundry or clothing items and detergent. In general, wash and dry with the warmest temperatures recommended on the label.  Clean all areas the individual has used often  Clean all touchable surfaces, such as counters, tabletops, doorknobs, bathroom fixtures, toilets, phones, keyboards, tablets, and bedside tables, every day. Also, clean any surfaces that may have blood, body fluids, and/or secretions or excretions on them.  Wear gloves when cleaning surfaces the patient has come in contact with.  Use a diluted bleach solution (e.g., dilute bleach with 1 part bleach and 10 parts water) or a household disinfectant with a label that says EPA-registered for coronaviruses. To make a bleach solution at home, add 1 tablespoon of bleach to 1 quart (4 cups) of water. For a larger supply, add  cup of bleach to 1 gallon (16 cups) of water.  Read labels of cleaning products and follow recommendations provided on product labels. Labels contain instructions for safe and effective use of the cleaning product including precautions you should take when applying the product, such as wearing gloves or eye protection and making sure you have good ventilation during use of the product.  Remove gloves and wash hands immediately after cleaning.  Monitor yourself for signs and symptoms of illness Caregivers and household members are considered close contacts, should monitor their health, and will be asked to limit movement outside of the home to the extent possible. Follow the monitoring steps for close contacts listed on the symptom monitoring form.   ? If you have additional questions, contact your local health department or call the epidemiologist on call at  [phone] (available 24/7). ? This guidance is subject to change. For the most up-to-date guidance from [facility], please refer to their website: [email or web address]

## 2019-10-02 NOTE — Progress Notes (Signed)
Patient instructed on Spanish AVS with stratus interpreter in [place].  Instucted on medication regimen, follow up appointments and discharge education regarding activity.  All patients questions answered.  Patient dressed and all belongings ready for discharge.  Awaiting ride.

## 2019-10-02 NOTE — Plan of Care (Signed)

## 2019-10-02 NOTE — Progress Notes (Signed)
Ambulated patient appox 40-50 feet on room air and oxygen saturation was 96-98% entire time.

## 2019-10-02 NOTE — Progress Notes (Signed)
Patient being taken out via personal wheelchair at this time to family waiting in car.  No s.s of distress noted denies any pain.

## 2019-10-02 NOTE — TOC Transition Note (Signed)
Transition of Care [facility]) - CM/SW Discharge Note   Patient Details  Name: Sayyid Harewood MRN: [id number] Date of Birth: October 14, 1970  [facility]) CM/SW Contact:  Carles Collet, RN Phone Number: 10/02/2019, 9:08 AM   Clinical Narrative:    Notified [facility] of DC to initiate [facility] services.           Patient Goals and CMS Choice        Discharge Placement                       Discharge Plan and Services                                     Social Determinants of Health (SDOH) Interventions     Readmission Risk Interventions Readmission Risk Prevention Plan 09/03/2018  PCP or Specialist Appt within 3-5 Days Complete  Some recent data might be hidden

## 2019-10-21 ENCOUNTER — Encounter: Payer: Self-pay | Admitting: Internal Medicine

## 2019-11-12 ENCOUNTER — Other Ambulatory Visit: Payer: Self-pay | Admitting: Family Medicine

## 2019-11-12 DIAGNOSIS — E1122 Type 2 diabetes mellitus with diabetic chronic kidney disease: Secondary | ICD-10-CM

## 2019-11-12 NOTE — Telephone Encounter (Signed)
Requested medication (s) are due for refill today: no  Requested medication (s) are on the active medication list: yes  Last refill:  08/04/2019  Future visit scheduled: no  Notes to clinic: review for refill Patient doesn't have a pcp listed    Requested Prescriptions  Pending Prescriptions Disp Refills   HUMULIN 70/30 (70-30) 100 UNIT/ML injection [Pharmacy Med Name: HUMULIN 70/30 VIAL (70-30) 100 SUS] 20 mL 6    Sig: INJECT 15 UNITS INTO THE SKIN 2 (TWO) TIMES DAILY WITH A MEAL.      Endocrinology:  Diabetes - Insulins Failed - 11/12/2019 11:27 AM      Failed - HBA1C is between 0 and 7.9 and within 180 days    HbA1c, POC (controlled diabetic range)  Date Value Ref Range Status  09/28/2018 6.8 0.0 - 7.0 % Final          Passed - Valid encounter within last 6 months    Recent Outpatient Visits           1 month ago Hx MRSA infection   [place], Jeneen Rinks, MD   1 year ago Diabetes mellitus with ESRD (end-stage renal disease) ([place])   [place], Charlane Ferretti, MD   1 year ago Diabetes mellitus with ESRD (end-stage renal disease) ([place])   [place] Kar[facility] Plumber B, MD   3 years ago Diabetes mellitus with ESRD (end-stage renal disease) ([place])   [name], Charlane Ferretti, MD   3 years ago Diabetes mellitus with ESRD (end-stage renal disease) [facility])   [facility] [name], MD

## 2019-12-01 ENCOUNTER — Other Ambulatory Visit: Payer: Self-pay

## 2019-12-01 ENCOUNTER — Telehealth: Payer: Self-pay | Admitting: Orthopedic Surgery

## 2019-12-01 ENCOUNTER — Emergency Department (HOSPITAL_COMMUNITY): Payer: Self-pay

## 2019-12-01 ENCOUNTER — Encounter (HOSPITAL_COMMUNITY): Payer: Self-pay | Admitting: Emergency Medicine

## 2019-12-01 DIAGNOSIS — B182 Chronic viral hepatitis C: Secondary | ICD-10-CM | POA: Diagnosis present

## 2019-12-01 DIAGNOSIS — T8743 Infection of amputation stump, right lower extremity: Secondary | ICD-10-CM | POA: Diagnosis present

## 2019-12-01 DIAGNOSIS — Z79899 Other long term (current) drug therapy: Secondary | ICD-10-CM

## 2019-12-01 DIAGNOSIS — Z794 Long term (current) use of insulin: Secondary | ICD-10-CM

## 2019-12-01 DIAGNOSIS — E1140 Type 2 diabetes mellitus with diabetic neuropathy, unspecified: Secondary | ICD-10-CM | POA: Diagnosis present

## 2019-12-01 DIAGNOSIS — N186 End stage renal disease: Secondary | ICD-10-CM | POA: Diagnosis present

## 2019-12-01 DIAGNOSIS — D631 Anemia in chronic kidney disease: Secondary | ICD-10-CM | POA: Diagnosis present

## 2019-12-01 DIAGNOSIS — N2581 Secondary hyperparathyroidism of renal origin: Secondary | ICD-10-CM | POA: Diagnosis present

## 2019-12-01 DIAGNOSIS — E1122 Type 2 diabetes mellitus with diabetic chronic kidney disease: Secondary | ICD-10-CM | POA: Diagnosis present

## 2019-12-01 DIAGNOSIS — B1920 Unspecified viral hepatitis C without hepatic coma: Secondary | ICD-10-CM | POA: Diagnosis present

## 2019-12-01 DIAGNOSIS — Z992 Dependence on renal dialysis: Secondary | ICD-10-CM

## 2019-12-01 DIAGNOSIS — Z89512 Acquired absence of left leg below knee: Secondary | ICD-10-CM

## 2019-12-01 DIAGNOSIS — Z20822 Contact with and (suspected) exposure to covid-19: Secondary | ICD-10-CM | POA: Diagnosis present

## 2019-12-01 DIAGNOSIS — Z8614 Personal history of Methicillin resistant Staphylococcus aureus infection: Secondary | ICD-10-CM

## 2019-12-01 DIAGNOSIS — Y835 Amputation of limb(s) as the cause of abnormal reaction of the patient, or of later complication, without mention of misadventure at the time of the procedure: Secondary | ICD-10-CM | POA: Diagnosis present

## 2019-12-01 DIAGNOSIS — E1151 Type 2 diabetes mellitus with diabetic peripheral angiopathy without gangrene: Secondary | ICD-10-CM | POA: Diagnosis present

## 2019-12-01 DIAGNOSIS — Z89511 Acquired absence of right leg below knee: Secondary | ICD-10-CM

## 2019-12-01 DIAGNOSIS — Z79891 Long term (current) use of opiate analgesic: Secondary | ICD-10-CM

## 2019-12-01 DIAGNOSIS — L03115 Cellulitis of right lower limb: Principal | ICD-10-CM | POA: Diagnosis present

## 2019-12-01 DIAGNOSIS — B20 Human immunodeficiency virus [HIV] disease: Secondary | ICD-10-CM | POA: Diagnosis present

## 2019-12-01 DIAGNOSIS — Z833 Family history of diabetes mellitus: Secondary | ICD-10-CM

## 2019-12-01 NOTE — ED Triage Notes (Signed)
Patient c/o pain to right leg knee to hip x3 days. Hx right BKA. Ambulatory with walker. Wound to leg.

## 2019-12-01 NOTE — Telephone Encounter (Signed)
Larissa from [facility] called requesting a call back. Amedeo Gory is asking if Dr. Sharol Given is the doctor who order patient's prosthetic leg. Please give Amedeo Gory a call at [phone].

## 2019-12-01 NOTE — Telephone Encounter (Signed)
I called and lm to advise that yes Dr. Sharol Given would be the doctor to write order for prosthetic to call with any other questions.

## 2019-12-02 ENCOUNTER — Inpatient Hospital Stay (HOSPITAL_COMMUNITY)
Admission: EM | Admit: 2019-12-02 | Discharge: 2019-12-03 | DRG: 602 | Disposition: A | Discharge status: Home / Self Care | Payer: Self-pay | Attending: Family Medicine | Admitting: Family Medicine

## 2019-12-02 ENCOUNTER — Emergency Department (HOSPITAL_COMMUNITY): Payer: Self-pay

## 2019-12-02 ENCOUNTER — Encounter (HOSPITAL_COMMUNITY): Payer: Self-pay | Admitting: Emergency Medicine

## 2019-12-02 DIAGNOSIS — Z21 Asymptomatic human immunodeficiency virus [HIV] infection status: Secondary | ICD-10-CM | POA: Diagnosis present

## 2019-12-02 DIAGNOSIS — B20 Human immunodeficiency virus [HIV] disease: Secondary | ICD-10-CM | POA: Diagnosis present

## 2019-12-02 DIAGNOSIS — Z89511 Acquired absence of right leg below knee: Secondary | ICD-10-CM

## 2019-12-02 DIAGNOSIS — Z794 Long term (current) use of insulin: Secondary | ICD-10-CM

## 2019-12-02 DIAGNOSIS — T8743 Infection of amputation stump, right lower extremity: Secondary | ICD-10-CM

## 2019-12-02 DIAGNOSIS — N186 End stage renal disease: Secondary | ICD-10-CM

## 2019-12-02 DIAGNOSIS — L03115 Cellulitis of right lower limb: Secondary | ICD-10-CM | POA: Diagnosis present

## 2019-12-02 DIAGNOSIS — Z89512 Acquired absence of left leg below knee: Secondary | ICD-10-CM

## 2019-12-02 DIAGNOSIS — T8740 Infection of amputation stump, unspecified extremity: Secondary | ICD-10-CM | POA: Diagnosis present

## 2019-12-02 DIAGNOSIS — Z992 Dependence on renal dialysis: Secondary | ICD-10-CM

## 2019-12-02 DIAGNOSIS — E1122 Type 2 diabetes mellitus with diabetic chronic kidney disease: Secondary | ICD-10-CM | POA: Diagnosis present

## 2019-12-02 LAB — CBC WITH DIFFERENTIAL/PLATELET
Abs Immature Granulocytes: 0.03 10*3/uL (ref 0.00–0.07)
Basophils Absolute: 0.1 10*3/uL (ref 0.0–0.1)
Basophils Relative: 1 %
Eosinophils Absolute: 0.4 10*3/uL (ref 0.0–0.5)
Eosinophils Relative: 4 %
HCT: 35.5 % — ABNORMAL LOW (ref 39.0–52.0)
Hemoglobin: 11.9 g/dL — ABNORMAL LOW (ref 13.0–17.0)
Immature Granulocytes: 0 %
Lymphocytes Relative: 17 %
Lymphs Abs: 1.7 10*3/uL (ref 0.7–4.0)
MCH: 34.5 pg — ABNORMAL HIGH (ref 26.0–34.0)
MCHC: 33.5 g/dL (ref 30.0–36.0)
MCV: 102.9 fL — ABNORMAL HIGH (ref 80.0–100.0)
Monocytes Absolute: 0.8 10*3/uL (ref 0.1–1.0)
Monocytes Relative: 8 %
Neutro Abs: 7.2 10*3/uL (ref 1.7–7.7)
Neutrophils Relative %: 70 %
Platelets: 173 10*3/uL (ref 150–400)
RBC: 3.45 MIL/uL — ABNORMAL LOW (ref 4.22–5.81)
RDW: 13.1 % (ref 11.5–15.5)
WBC: 10.2 10*3/uL (ref 4.0–10.5)
nRBC: 0 % (ref 0.0–0.2)

## 2019-12-02 LAB — CBG MONITORING, ED
Glucose-Capillary: 189 mg/dL — ABNORMAL HIGH (ref 70–99)
Glucose-Capillary: 168 mg/dL — ABNORMAL HIGH (ref 70–99)
Glucose-Capillary: 225 mg/dL — ABNORMAL HIGH (ref 70–99)
Glucose-Capillary: 253 mg/dL — ABNORMAL HIGH (ref 70–99)

## 2019-12-02 LAB — BASIC METABOLIC PANEL
Anion gap: 13 (ref 5–15)
BUN: 17 mg/dL (ref 6–20)
CO2: 32 mmol/L (ref 22–32)
Calcium: 9 mg/dL (ref 8.9–10.3)
Chloride: 94 mmol/L — ABNORMAL LOW (ref 98–111)
Creatinine, Ser: 6.26 mg/dL — ABNORMAL HIGH (ref 0.61–1.24)
GFR calc Af Amer: 11 mL/min — ABNORMAL LOW (ref >60)
GFR calc non Af Amer: 10 mL/min — ABNORMAL LOW (ref >60)
Glucose, Bld: 192 mg/dL — ABNORMAL HIGH (ref 70–99)
Potassium: 3.8 mmol/L (ref 3.5–5.1)
Sodium: 139 mmol/L (ref 135–145)

## 2019-12-02 LAB — HEMOGLOBIN A1C
Hgb A1c MFr Bld: 6.9 % — ABNORMAL HIGH (ref 4.8–5.6)
Mean Plasma Glucose: 151.33 mg/dL

## 2019-12-02 LAB — LACTIC ACID, PLASMA: Lactic Acid, Venous: 0.8 mmol/L (ref 0.5–1.9)

## 2019-12-02 LAB — GLUCOSE, CAPILLARY: Glucose-Capillary: 170 mg/dL — ABNORMAL HIGH (ref 70–99)

## 2019-12-02 LAB — SARS CORONAVIRUS 2 BY RT PCR (HOSPITAL ORDER, PERFORMED IN ~~LOC~~ HOSPITAL LAB): SARS Coronavirus 2: NEGATIVE

## 2019-12-02 MED ORDER — BICTEGRAVIR-EMTRICITAB-TENOFOV 50-200-25 MG PO TABS
1.0000 | ORAL_TABLET | Freq: Every day | ORAL | Status: DC
  Administered 2019-12-03: 1 via ORAL
  Filled 2019-12-02: qty 1

## 2019-12-02 MED ORDER — ACETAMINOPHEN 500 MG PO TABS
500.0000 mg | ORAL_TABLET | Freq: Four times a day (QID) | ORAL | Status: DC | PRN
  Administered 2019-12-02 – 2019-12-03 (×2): 500 mg via ORAL
  Filled 2019-12-02 (×2): qty 1

## 2019-12-02 MED ORDER — DOXERCALCIFEROL 4 MCG/2ML IV SOLN: 5.0000 ug | INTRAVENOUS | Status: DC

## 2019-12-02 MED ORDER — SODIUM CHLORIDE 0.9 % IV SOLN: 1.0000 g | INTRAVENOUS | Status: DC

## 2019-12-02 MED ORDER — OXYCODONE-ACETAMINOPHEN 5-325 MG PO TABS: 1.0000 | ORAL_TABLET | Freq: Once | ORAL | Status: DC

## 2019-12-02 MED ORDER — HYDROCODONE-ACETAMINOPHEN 5-325 MG PO TABS
1.0000 | ORAL_TABLET | ORAL | Status: DC | PRN
  Administered 2019-12-03 (×2): 2 via ORAL
  Filled 2019-12-02 (×2): qty 2

## 2019-12-02 MED ORDER — SENNOSIDES-DOCUSATE SODIUM 8.6-50 MG PO TABS: 1.0000 | ORAL_TABLET | Freq: Every evening | ORAL | Status: DC | PRN

## 2019-12-02 MED ORDER — VANCOMYCIN HCL IN DEXTROSE 1-5 GM/200ML-% IV SOLN
1000.0000 mg | Freq: Once | INTRAVENOUS | Status: AC
  Administered 2019-12-02: 1000 mg via INTRAVENOUS
  Filled 2019-12-02: qty 200

## 2019-12-02 MED ORDER — INSULIN ASPART 100 UNIT/ML ~~LOC~~ SOLN
0.0000 [IU] | Freq: Three times a day (TID) | SUBCUTANEOUS | Status: DC
  Administered 2019-12-02: 1 [IU] via SUBCUTANEOUS
  Administered 2019-12-02: 2 [IU] via SUBCUTANEOUS
  Filled 2019-12-02: qty 0.06

## 2019-12-02 MED ORDER — HYDROMORPHONE HCL 1 MG/ML IJ SOLN: 0.5000 mg | INTRAMUSCULAR | Status: DC | PRN

## 2019-12-02 MED ORDER — CHLORHEXIDINE GLUCONATE CLOTH 2 % EX PADS: 6.0000 | MEDICATED_PAD | Freq: Every day | CUTANEOUS | Status: DC

## 2019-12-02 MED ORDER — LACTATED RINGERS IV BOLUS
500.0000 mL | Freq: Once | INTRAVENOUS | Status: AC
  Administered 2019-12-02: 500 mL via INTRAVENOUS

## 2019-12-02 MED ORDER — ACETAMINOPHEN 650 MG RE SUPP: 650.0000 mg | Freq: Four times a day (QID) | RECTAL | Status: DC | PRN

## 2019-12-02 MED ORDER — INSULIN ASPART 100 UNIT/ML ~~LOC~~ SOLN
0.0000 [IU] | Freq: Every day | SUBCUTANEOUS | Status: DC
  Filled 2019-12-02: qty 0.05

## 2019-12-02 MED ORDER — VANCOMYCIN HCL 500 MG/100ML IV SOLN: 500.0000 mg | Freq: Once | INTRAVENOUS | Status: DC

## 2019-12-02 MED ORDER — HEPARIN SODIUM (PORCINE) 5000 UNIT/ML IJ SOLN
5000.0000 [IU] | Freq: Three times a day (TID) | INTRAMUSCULAR | Status: DC
  Administered 2019-12-02 – 2019-12-03 (×2): 5000 [IU] via SUBCUTANEOUS
  Filled 2019-12-02 (×2): qty 1

## 2019-12-02 MED ORDER — SODIUM CHLORIDE 0.9 % IV SOLN
2.0000 g | Freq: Once | INTRAVENOUS | Status: AC
  Administered 2019-12-02: 2 g via INTRAVENOUS
  Filled 2019-12-02: qty 2

## 2019-12-02 NOTE — ED Provider Notes (Signed)
[facility] DEPT Provider Note   CSN: [id number] Arrival date & time: 12/01/19  1949     History Chief Complaint  Patient presents with  . Leg Pain    Alvin Daniels is a 49 y.o. male with a history of HIV, ESRD on HD, osteomyelitis s/p bilateral BKAs, and diabetes mellitus type 2 who presents to the emergency department with a chief complaint of right leg pain.  The patient reports that he fell about a month ago and hit his right leg.  He denies that he was having any complaints from the fall until earlier this week when he began to have pain in his right thigh that extends to his right stump.  He is unable to characterize the pain.  No known aggravating or alleviating factors.  He reports that he took a couple of antibiotic pills left over from a previous prescription for approximately 3 days for his symptoms, but ran out of the medication.  He reports that yesterday that he noticed a wound on his right lower leg that has been draining purulent discharge.  Reports that he has change the bandage twice since he first noticed the wound.  He denies fever, chills, redness, chest pain, cough, shortness of breath, abdominal pain, nausea, vomiting, diarrhea, or rash.  He has been been compliant with his home dialysis.  He reports that he was having bilateral hip pain earlier during dialysis today, but this is since resolved.  Per chart review, patient is followed by infectious disease and was seen by Dr. Drucilla Schmidt in January 2021 when he was having pain and swelling to his right stump.  He was noted to have back pain during dialysis that resolves as soon as dialysis ceases.  He had an MRI of the right BKA site ordered due to his history of recurrent bacteremias and infection and was referred back to Dr. Sharol Given for a refitting of his prosthesis.  He has been taking naproxen for his pain at home with good improvement.  The history is provided by the patient and medical  records. A language interpreter was used ([place]).       Past Medical History:  Diagnosis Date  . AIDS ([place]) 11/22/2014  . Back pain 06/09/2019  . BKA stump complication ([place]) 2/54/2706  . Chronic diarrhea   . Chronic hepatitis C without hepatic coma ([place]) 11/22/2014  . 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 Diabetic neuropathy ([place])   . ESRD (end stage renal disease) on dialysis [facility])    "TTS; don't remember street name" (05/03/2014)  . Hepatitis C   . HIV INFECTION 06/27/2010   Qualifier: Diagnosis of  By: Nickola Major CMA ( [place]), Geni Bers    . Hypotension 06/02/2012  . Metabolic bone disease 06/15/7626  . MRSA infection   . Normocytic anemia 06/17/2012  . Pancreatitis   . Pressure ulcer of BKA stump, stage 2 ([place]) 11/22/2014  . Renal disorder   . Severe protein-calorie malnutrition ([place]) 06/17/2012  . Uncontrolled diabetes mellitus with complications ([place]) 07/26/1759   Annotation: uncontrolled Qualifier: Diagnosis of  By: Nickola Major CMA Deborra Medina), Jacqueline      Patient Active Problem List   Diagnosis Date Noted  . Amputation stump infection ([place]) 12/02/2019  . Scapular fracture 09/28/2019  . COVID-19 virus infection 09/28/2019  . Chest pain 09/27/2019  . Hx MRSA infection 09/21/2019  . BKA stump complication ([place]) 60/73/7106  . Back pain 06/09/2019  . Abscess of foot   .  Abscess of ankle 08/29/2018  . Charcot foot due to diabetes mellitus ([place]) 07/23/2018  . MRSA bacteremia   . Diabetic foot ulcer ([place]) 11/19/2017  . Femur fracture, left ([place]) 08/16/2016  . HIV (human immunodeficiency virus infection) ([place]) 08/16/2016  . GERD (gastroesophageal reflux disease) 05/24/2016  . Burn 04/10/2016  . Pressure ulcer of BKA stump, stage 2 ([place]) 11/22/2014  . Chronic hepatitis C without hepatic coma ([place]) 11/22/2014  . S/P bilateral BKA (below knee amputation) ([place]) 06/17/2014  . HIV disease ([place])   . Poor dentition 10/25/2013  . ESRD on dialysis ([place]) 10/25/2013  . Vision  changes 06/21/2013  . Orthostatic headache 04/15/2013  . Anemia of chronic renal failure 06/19/2012  . Metabolic bone disease 50/01/3817  . Chronic diarrhea 06/17/2012  . Severe protein-calorie malnutrition ([place]) 06/17/2012  . Normocytic anemia 06/17/2012  . Polyneuropathy in diabetes(357.2) 06/27/2010  . Diabetes mellitus with ESRD (end-stage renal disease) ([place]) 01/03/2010    Past Surgical History:  Procedure Laterality Date  . AMPUTATION Left 04/20/2014   Procedure: 3rd toe amputation, 4th Toe Amputation,  5th Toe Amputation;  Surgeon: Newt Minion, MD;  Location: [facility];  Service: Orthopedics;  Laterality: Left;  . AMPUTATION Left 05/02/2014   Procedure: Midfoot Amputation;  Surgeon: Newt Minion, MD;  Location: [facility];  Service: Orthopedics;  Laterality: Left;  . AMPUTATION Left 06/17/2014   Procedure: AMPUTATION BELOW KNEE;  Surgeon: Newt Minion, MD;  Location: [facility];  Service: Orthopedics;  Laterality: Left;  . AMPUTATION Right 09/04/2018   Procedure: RIGHT BELOW KNEE AMPUTATION;  Surgeon: Newt Minion, MD;  Location: [facility];  Service: Orthopedics;  Laterality: Right;  RIGHT BELOW KNEE AMPUTATION  . AV FISTULA PLACEMENT Left   . AV FISTULA PLACEMENT Left 05/10/2016   Procedure: Creation Left Arm Brachiocephalic Arteriovenous Fistula and Ligation of Radiocephalic Fistula;  Surgeon: Angelia Mould, MD;  Location: [facility];  Service: Vascular;  Laterality: Left;  . FEMUR IM NAIL Left 08/17/2016   Procedure: INTRAMEDULLARY (IM) RETROGRADE FEMORAL NAILING;  Surgeon: Marybelle Killings, MD;  Location: [facility];  Service: Orthopedics;  Laterality: Left;  . FOOT AMPUTATION THROUGH ANKLE Left 12/'21/2015   midfoot  . IR GENERIC HISTORICAL Left 05/01/2016   IR THROMBECTOMY AV FISTULA W/THROMBOLYSIS/PTA INC/SHUNT/IMG LEFT 05/01/2016 [name], MD [facility]  . IR GENERIC HISTORICAL  05/01/2016   IR US GUIDE VASC ACCESS LEFT 05/01/2016 [name], MD [facility]  . IR GENERIC  HISTORICAL  05/07/2016   IR FLUORO GUIDE CV LINE RIGHT 05/07/2016 Corrie Mckusick, DO [facility]  . IR GENERIC HISTORICAL  05/07/2016   IR US GUIDE VASC ACCESS RIGHT 05/07/2016 Corrie Mckusick, DO [facility]  . IR GENERIC HISTORICAL  05/22/2016   IR US GUIDE VASC ACCESS RIGHT 05/22/2016 Greggory Keen, MD [facility]  . IR GENERIC HISTORICAL  05/22/2016   IR FLUORO GUIDE CV LINE RIGHT 05/22/2016 Greggory Keen, MD [facility]  . IR REMOVAL TUN CV CATH W/O FL  08/21/2016  . PERIPHERAL VASCULAR CATHETERIZATION Left 05/09/2016   Procedure: A/V Fistulagram;  Surgeon: Angelia Mould, MD;  Location: [facility];  Service: Cardiovascular;  Laterality: Left;  arm  . TEE WITHOUT CARDIOVERSION N/A 06/22/2018   Procedure: TRANSESOPHAGEAL ECHOCARDIOGRAM (TEE);  Surgeon: Jerline Pain, MD;  Location: [facility] ENDOSCOPY;  Service: Cardiovascular;  Laterality: N/A;       Family History  Problem Relation Age of Onset  . Diabetes Mother   . Diabetes Father  Social History   Tobacco Use  . Smoking status: Never Smoker  . Smokeless tobacco: Never Used  Vaping Use  . Vaping Use: Never used  Substance Use Topics  . Alcohol use: No  . Drug use: No    Home Medications Prior to Admission medications   Medication Sig Start Date End Date Taking? Authorizing Provider  bictegravir-emtricitabine-tenofovir AF (BIKTARVY) 50-200-25 MG TABS tablet Take 1 tablet by mouth daily. Patient not taking: Reported on 12/02/2019 06/09/19   Tommy Medal, Lavell Islam, MD  Blood Glucose Monitoring Suppl (TRUE METRIX METER) DEVI 1 each by Does not apply route 3 (three) times daily. 09/15/18   [name], MD  glucose blood (TRUE METRIX BLOOD GLUCOSE TEST) test strip 1 each by Other route 3 (three) times daily. 09/15/18   [name], MD  HYDROcodone-acetaminophen (NORCO/VICODIN) 5-325 MG tablet Take 1-2 tablets by mouth every 4 (four) hours as needed. Patient not taking: Reported on 12/02/2019 09/26/19   Lacretia Leigh, MD  insulin NPH-regular Human (70-30) 100 UNIT/ML injection Inject 15 Units into the skin 2 (two) times daily with a meal. Patient not taking: Reported on 12/02/2019 09/28/18   [name], MD  Insulin Syringe-Needle U-100 (BD INSULIN SYRINGE ULTRAFINE) 31G X 15/64" 0.5 ML MISC 1 each by Does not apply route 2 (two) times daily. Please dispense needles as well. 09/28/18   [name], MD  metaxalone (SKELAXIN) 800 MG tablet Take 1 tablet (800 mg total) by mouth 3 (three) times daily. Patient not taking: Reported on 12/02/2019 09/26/19   Lacretia Leigh, MD  midodrine (PROAMATINE) 10 MG tablet Take 1 tablet (10 mg total) by mouth Every Tuesday,Thursday,and Saturday with dialysis. Patient not taking: Reported on 12/02/2019 09/08/18   Lavina Hamman, MD  TRUEplus Lancets 28G MISC 1 each by Does not apply route 3 (three) times daily. 09/15/18   [name], MD    Allergies    Patient has no known allergies.  Review of Systems   Review of Systems  Constitutional: Negative for appetite change, chills and fever.  Respiratory: Negative for shortness of breath.   Cardiovascular: Negative for chest pain.  Gastrointestinal: Negative for abdominal pain, diarrhea, nausea and vomiting.  Genitourinary: Negative for dysuria.  Musculoskeletal: Positive for arthralgias, joint swelling and myalgias. Negative for back pain.  Skin: Positive for wound. Negative for rash.  Allergic/Immunologic: Negative for immunocompromised state.  Neurological: Negative for dizziness, syncope, weakness and headaches.  Psychiatric/Behavioral: Negative for confusion.    Physical Exam Updated Vital Signs BP 113/69   Pulse 87   Temp 99.1 F (37.3 C)   Resp 18   SpO2 94%   Physical Exam Vitals and nursing note reviewed.  Constitutional:      Appearance: He is well-developed. He is not ill-appearing.     Comments: No acute distress  HENT:     Head: Normocephalic.  Eyes:     Conjunctiva/sclera:  Conjunctivae normal.  Cardiovascular:     Rate and Rhythm: Normal rate and regular rhythm.     Pulses: Normal pulses.     Heart sounds: No murmur heard.  No gallop.   Pulmonary:     Effort: Pulmonary effort is normal. No respiratory distress.     Breath sounds: No stridor. No wheezing, rhonchi or rales.  Chest:     Chest wall: No tenderness.  Abdominal:     General: There is no distension.     Palpations: Abdomen is soft.  Musculoskeletal:     Cervical back:  Neck supple.     Comments: Bilateral BKA's  Right stump with no wound dehiscence over surgical incision.  There is mild swelling of the right stump compared to the left.  A small amount of purulent drainage is noted from a wound to the right lower leg with surrounding warmth, but no overt erythema.  The area feels boggy and warm to the touch.  No evidence of wet or dry gangrene.  There is surrounding scaling of the skin. full active and passive range of motion of the right knee.  Right thigh is nontender.  Right hip is nontender.  Left hip is nontender.  Spine is nontender.  Skin:    General: Skin is warm and dry.     Comments: Skin is warm to the touch  Neurological:     Mental Status: He is alert.  Psychiatric:        Behavior: Behavior normal.        ED Results / Procedures / Treatments   Labs (all labs ordered are listed, but only abnormal results are displayed) Labs Reviewed  CBC WITH DIFFERENTIAL/PLATELET - Abnormal; Notable for the following components:      Result Value   RBC 3.45 (*)    Hemoglobin 11.9 (*)    HCT 35.5 (*)    MCV 102.9 (*)    MCH 34.5 (*)    All other components within normal limits  BASIC METABOLIC PANEL - Abnormal; Notable for the following components:   Chloride 94 (*)    Glucose, Bld 192 (*)    Creatinine, Ser 6.26 (*)    GFR calc non Af Amer 10 (*)    GFR calc Af Amer 11 (*)    All other components within normal limits  CULTURE, BLOOD (ROUTINE X 2)  CULTURE, BLOOD (ROUTINE X 2)   SARS CORONAVIRUS 2 BY RT PCR (HOSPITAL ORDER, [facility])  LACTIC ACID, PLASMA    EKG None  Radiology DG Knee 2 Views Right  Result Date: 12/02/2019 CLINICAL DATA:  Right leg wound. EXAM: RIGHT KNEE - 1-2 VIEW COMPARISON:  12/01/2019.  MRI 10/05/2010. FINDINGS: Prior BKA. Medial compartment degenerative change. No acute bony abnormality identified. No bony erosions noted. Peripheral vascular calcification. No radiopaque foreign body noted. IMPRESSION: 1. Prior BKA. Mild degenerative changes medial compartment. No acute or focal bony abnormality is identified. 2.  Peripheral vascular disease.  No radiopaque foreign body noted. Electronically Signed   By: Marcello Moores  Register   On: 12/02/2019 04:48   MR KNEE RIGHT WO CONTRAST  Result Date: 12/02/2019 CLINICAL DATA:  Pain and swelling. EXAM: MRI OF THE RIGHT KNEE WITHOUT CONTRAST TECHNIQUE: Multiplanar, multisequence MR imaging of the knee was performed. No intravenous contrast was administered. COMPARISON:  Radiographs 12/02/2019 FINDINGS: Evidence of prior below-knee amputation. The osteotomy site of the tibia and fibula appear normal. No findings suspicious for osteomyelitis. Fluid noted around the distal aspect of the tibial stump is likely adventitial bursa formation. Diffuse subcutaneous soft tissue swelling/edema/fluid mainly along the lateral and posterior aspect of the knee and tibia suggesting cellulitis. There is also moderate edema like signal abnormality in the upper calf musculature suggesting nonspecific myositis. No evidence of pyomyositis. There is also fatty atrophy of the muscles. No MR findings suspicious for septic arthritis at the knee joint. Small joint effusion. Normal appearing articular cartilage. No findings for internal derangement. IMPRESSION: 1. Prior below-knee amputation. No findings suspicious for osteomyelitis involving the tibia or fibula or septic arthritis at  the knee joint. 2. Diffuse  subcutaneous soft tissue swelling/edema/fluid mainly along the lateral and posterior aspect of the knee and tibia suggesting cellulitis. 3. Moderate edema like signal abnormality in the upper calf musculature suggesting nonspecific myositis. No evidence of pyomyositis. 4. Small fluid collection around the tibial stump could be adventitial bursa formation. Electronically Signed   By: Marijo Sanes M.D.   On: 12/02/2019 07:27   DG Hip Unilat  With Pelvis 2-3 Views Right  Result Date: 12/01/2019 CLINICAL DATA:  Pain EXAM: RIGHT FEMUR 1 VIEW; DG HIP (WITH OR WITHOUT PELVIS) 2-3V RIGHT COMPARISON:  None. FINDINGS: Single view radiograph of the pelvis and two view radiograph of the right hip demonstrate normal alignment. No fracture or dislocation. No destructive osseous lesion. The femoral head is normally developed and well seated within the right acetabulum. Intramedullary rod noted within the visualized proximal left femoral shaft. The limited evaluation of the left hip is otherwise unremarkable. Advanced vascular calcifications are seen within the pelvis and medial thighs. IMPRESSION: Negative. Electronically Signed   By: Fidela Salisbury MD   On: 12/01/2019 21:10   DG FEMUR 1V RIGHT  Result Date: 12/01/2019 CLINICAL DATA:  Pain EXAM: RIGHT FEMUR 1 VIEW; DG HIP (WITH OR WITHOUT PELVIS) 2-3V RIGHT COMPARISON:  None. FINDINGS: Single view radiograph of the pelvis and two view radiograph of the right hip demonstrate normal alignment. No fracture or dislocation. No destructive osseous lesion. The femoral head is normally developed and well seated within the right acetabulum. Intramedullary rod noted within the visualized proximal left femoral shaft. The limited evaluation of the left hip is otherwise unremarkable. Advanced vascular calcifications are seen within the pelvis and medial thighs. IMPRESSION: Negative. Electronically Signed   By: Fidela Salisbury MD   On: 12/01/2019 21:10    Procedures Procedures  (including critical care time)  Medications Ordered in ED Medications  oxyCODONE-acetaminophen (PERCOCET/ROXICET) 5-325 MG per tablet 1 tablet (1 tablet Oral Not Given 12/02/19 0528)  vancomycin (VANCOCIN) IVPB 1000 mg/200 mL premix (1,000 mg Intravenous New Bag/Given 12/02/19 0650)  vancomycin (VANCOREADY) IVPB 500 mg/100 mL (has no administration in time range)  ceFEPIme (MAXIPIME) 1 g in sodium chloride 0.9 % 100 mL IVPB (has no administration in time range)  ceFEPIme (MAXIPIME) 2 g in sodium chloride 0.9 % 100 mL IVPB (0 g Intravenous Stopped 12/02/19 0557)    ED Course  I have reviewed the triage vital signs and the nursing notes.  Pertinent labs & imaging results that were available during my care of the patient were reviewed by me and considered in my medical decision making (see chart for details).    MDM Rules/Calculators/A&P                          49 year old male with a history of HIV, ESRD on HD, osteomyelitis s/p bilateral BKAs, and diabetes mellitus type 2 who presents to the emergency department with right leg pain and a new wound on his leg that he first noticed yesterday.  It has been draining purulent discharge since yesterday.  Apparently, the patient had some leftover antibiotics from a previous prescription that he has taken 3 tablets of over the last few days for his leg pain.  He cannot recall the name of the antibiotic.  Per chart review, patient has been having intermittent pain and swelling of the right leg for many months.  He has a history of recurrent bacteremias and infection.  Bilateral BKA  secondary to gangrene.  He was seen by Dr. Drucilla Schmidt back in January for similar symptoms, but no wound at that time.  An MRI of the right right knee and stump was ordered as ID was erring on the side of aggressive treatment given his history of recurrent infections, but the patient did not follow-up and have the study performed.  The patient then was seen and admitted after a  fall, which he states that he hit his right leg, in May 2021 when he was also found to have COVID-19.  Although the patient states that his leg pain only began 3 days ago, he later states that has been going on for several weeks since his fall in May.    The patient was discussed with Dr. Randal Buba, attending physician.  Oral temp is 99.1 in the ER.  WBC is 10.2, up from the patient's baseline of around 4.0-5.0.  Lactate is normal.  Will obtain blood cultures x2.  We will start the patient on cefepime and vancomycin.  I am concerned that given the patient's recurrent episodes of waxing and waning pain that he could have acute vs subacute osteomyelitis with a new developing purulent cellulitis versus myositis or acute osteomyelitis.  Will order MRI of the right knee.  Patient's creatinine is elevated, but there are no low electrolyte abnormalities.  He reports compliance with dialysis.  No electrolyte derangements.  Consulted the hospitalist team as the patient will require admission for further work-up and evaluation.  Spoke with Dr. Olevia Bowens who will accept the patient for admission, but the patient will need to be transferred to [facility] since he is a hemodialysis patient.  The patient appears reasonably stabilized for admission considering the current resources, flow, and capabilities available in the ED at this time, and I doubt any other [facility] requiring further screening and/or treatment in the ED prior to admission.  Final Clinical Impression(s) / ED Diagnoses Final diagnoses:  Infection of right below knee amputation [facility])    Rx / DC Orders ED Discharge Orders    None       Joanne Gavel, PA-C 12/02/19 0748    Palumbo, April, MD 12/02/19 2307

## 2019-12-02 NOTE — ED Notes (Signed)
Patient transported to MRI 

## 2019-12-02 NOTE — ED Notes (Signed)
Floor coverage messaged regarding pts blood pressure.

## 2019-12-02 NOTE — Progress Notes (Signed)
A consult was received from an ED physician for Vancomycin & Cefepime per pharmacy dosing.  The patient's profile has been reviewed for ht/wt/allergies/indication/available labs.   A one time order has been placed for Cefepime 2gm & Vancomycin 1gm IV.  Further antibiotics/pharmacy consults should be ordered by admitting physician if indicated.                       Thank you, Netta Cedars PharmD 12/02/2019  5:21 AM

## 2019-12-02 NOTE — ED Provider Notes (Signed)
Spoke with Dr. Olevia Bowens, who accepted the patient for admission    Shann Merrick, MD 12/02/19 5369

## 2019-12-02 NOTE — Progress Notes (Signed)
Pharmacy Antibiotic Note  Alvin Daniels is a 49 y.o. male admitted on 12/02/2019 with wound infection.  Pharmacy has been consulted for vanc/cefepime dosing. Note patient is s/p bilateral BKAs, currently receives HD on MWF per notes, and with hx HIV. Note pt weight of 52kg 2 months ago per chart  Plan:  Vancomycin 1g x 1 then will schedule vanc 500mg  IV x 1 to be given tomorrow evening post HD session, further dosing to be determined  Cefepime 2g x 1 then start 1g IV q24 per HD dosing recommendations     Temp (24hrs), Avg:99.1 F (37.3 C), Min:99.1 F (37.3 C), Max:99.1 F (37.3 C)  Recent Labs  Lab 12/02/19 0343 12/02/19 0344  WBC 10.2  --   CREATININE 6.26*  --   LATICACIDVEN  --  0.8    CrCl cannot be calculated (Unknown ideal weight.).    No Known Allergies    Thank you for allowing pharmacy to be a part of this patient's care.  Kara Mead 12/02/2019 7:38 AM

## 2019-12-02 NOTE — Consult Note (Signed)
Alvin Daniels  INPATIENT CONSULTATION  Reason for Consultation: ESRD on HD Requesting Provider: Dr. Neysa Bonito  HPI: Alvin Daniels is an 49 y.o. male with ESRD on HD MWF, HIV, anemia, secondary hyperparathyroidism, h/o R BKA who is seen for evaluation and management of ESRD and assoc conditions.   Had full HD yesterday.  Then presented to [facility] ED for R stump pain.  T 99.1, WBC 10.2.  Negative lactate.  Tx vanc + cefepime.  He says he got antibiotics at HD but I don't see any on med list right now.  Imaging c/w cellulitis and myositis.  He is admitted for therapy > primary d/w ID and plans vancomycin and if responds d/c on that.    Spoke to pt with interpreter.  No issues with HD yesterday.  Leg feeling some better. Asking when he will be transferred and how long stay.  PMH: Past Medical History:  Diagnosis Date  . AIDS ([place]) 11/22/2014  . Back pain 06/09/2019  . BKA stump complication ([place]) 1/95/0932  . Chronic diarrhea   . Chronic hepatitis C without hepatic coma ([place]) 11/22/2014  . 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 Diabetic neuropathy ([place])   . ESRD (end stage renal disease) on dialysis [facility]    "TTS; don't remember street name" (05/03/2014)  . Hepatitis C   . HIV INFECTION 06/27/2010   Qualifier: Diagnosis of  By: Nickola Major CMA ( [place]), Geni Bers    . Hypotension 06/02/2012  . Metabolic bone disease 10/18/1243  . MRSA infection   . Normocytic anemia 06/17/2012  . Pancreatitis   . Pressure ulcer of BKA stump, stage 2 ([place]) 11/22/2014  . Renal disorder   . Severe protein-calorie malnutrition ([place]) 06/17/2012  . Uncontrolled diabetes mellitus with complications ([place]) 12/20/9831   Annotation: uncontrolled Qualifier: Diagnosis of  By: Nickola Major CMA Deborra Medina), Geni Bers     PSH: Past Surgical History:  Procedure Laterality Date  . AMPUTATION Left 04/20/2014   Procedure: 3rd toe amputation, 4th Toe Amputation,  5th Toe Amputation;  Surgeon: Newt Minion, MD;   Location: [place];  Service: Orthopedics;  Laterality: Left;  . AMPUTATION Left 05/02/2014   Procedure: Midfoot Amputation;  Surgeon: Newt Minion, MD;  Location: [place];  Service: Orthopedics;  Laterality: Left;  . AMPUTATION Left 06/17/2014   Procedure: AMPUTATION BELOW KNEE;  Surgeon: Newt Minion, MD;  Location: [place];  Service: Orthopedics;  Laterality: Left;  . AMPUTATION Right 09/04/2018   Procedure: RIGHT BELOW KNEE AMPUTATION;  Surgeon: Newt Minion, MD;  Location: [place];  Service: Orthopedics;  Laterality: Right;  RIGHT BELOW KNEE AMPUTATION  . AV FISTULA PLACEMENT Left   . AV FISTULA PLACEMENT Left 05/10/2016   Procedure: Creation Left Arm Brachiocephalic Arteriovenous Fistula and Ligation of Radiocephalic Fistula;  Surgeon: Angelia Mould, MD;  Location: [place];  Service: Vascular;  Laterality: Left;  . FEMUR IM NAIL Left 08/17/2016   Procedure: INTRAMEDULLARY (IM) RETROGRADE FEMORAL NAILING;  Surgeon: Marybelle Killings, MD;  Location: [place];  Service: Orthopedics;  Laterality: Left;  . FOOT AMPUTATION THROUGH ANKLE Left 12/'21/2015   midfoot  . IR GENERIC HISTORICAL Left 05/01/2016   IR THROMBECTOMY AV FISTULA W/THROMBOLYSIS/PTA INC/SHUNT/IMG LEFT 05/01/2016 [name], MD [facility]  . IR GENERIC HISTORICAL  05/01/2016   IR US GUIDE VASC ACCESS LEFT 05/01/2016 [name], MD [facility]  . IR GENERIC HISTORICAL  05/07/2016   IR FLUORO GUIDE CV LINE RIGHT 05/07/2016 York Cerise  Wagner, DO [facility]  . IR GENERIC HISTORICAL  05/07/2016   IR US GUIDE VASC ACCESS RIGHT 05/07/2016 Corrie Mckusick, DO [facility]  . IR GENERIC HISTORICAL  05/22/2016   IR US GUIDE VASC ACCESS RIGHT 05/22/2016 Greggory Keen, MD [facility]  . IR GENERIC HISTORICAL  05/22/2016   IR FLUORO GUIDE CV LINE RIGHT 05/22/2016 Greggory Keen, MD [facility]  . IR REMOVAL TUN CV CATH W/O FL  08/21/2016  . PERIPHERAL VASCULAR CATHETERIZATION Left 05/09/2016   Procedure: A/V Fistulagram;  Surgeon:  Angelia Mould, MD;  Location: [place] [facility];  Service: Cardiovascular;  Laterality: Left;  arm  . TEE WITHOUT CARDIOVERSION N/A 06/22/2018   Procedure: TRANSESOPHAGEAL ECHOCARDIOGRAM (TEE);  Surgeon: Jerline Pain, MD;  Location: [facility] ENDOSCOPY;  Service: Cardiovascular;  Laterality: N/A;    Past Medical History:  Diagnosis Date  . AIDS ([place]) 11/22/2014  . Back pain 06/09/2019  . BKA stump complication ([place]) 7/68/1157  . Chronic diarrhea   . Chronic hepatitis C without hepatic coma ([name]) 11/22/2014  . 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 Diabetic neuropathy ([place])   . ESRD (end stage renal disease) on dialysis [facility])    "TTS; don't remember street name" (05/03/2014)  . Hepatitis C   . HIV INFECTION 06/27/2010   Qualifier: Diagnosis of  By: Nickola Major CMA ( [place]), Geni Bers    . Hypotension 06/02/2012  . Metabolic bone disease 06/18/2033  . MRSA infection   . Normocytic anemia 06/17/2012  . Pancreatitis   . Pressure ulcer of BKA stump, stage 2 ([place]) 11/22/2014  . Renal disorder   . Severe protein-calorie malnutrition ([place]) 06/17/2012  . Uncontrolled diabetes mellitus with complications ([place]) 5/97/4163   Annotation: uncontrolled Qualifier: Diagnosis of  By: Nickola Major CMA ( AAMA), Geni Bers      Medications:  I have reviewed the patient's current medications.  (Not in a hospital admission)   ALLERGIES:  No Known Allergies  FAM HX: Family History  Problem Relation Age of Onset  . Diabetes Mother   . Diabetes Father     Social History:   reports that he has never smoked. He has never used smokeless tobacco. He reports that he does not drink alcohol and does not use drugs.  ROS: 12 system ROS per HPI above  Blood pressure 104/70, pulse 78, temperature 99.1 F (37.3 C), resp. rate 17, SpO2 98 %. PHYSICAL EXAM: Gen: chronically ill, nontoxic  Eyes: anicteric ENT: MMM Neck: supple CV:  RRR Abd:  Soft, thin Lungs: clear GU: no foley Extr: R BKA stump  with ulceration laterally and some scaling skin changes, some warmth, no drainage Neuro: nonfocal   Results for orders placed or performed during the hospital encounter of 12/02/19 (from the past 48 hour(s))  CBC with Differential     Status: Abnormal   Collection Time: 12/02/19  3:43 AM  Result Value Ref Range   WBC 10.2 4.0 - 10.5 K/uL   RBC 3.45 (L) 4.22 - 5.81 MIL/uL   Hemoglobin 11.9 (L) 13.0 - 17.0 g/dL   HCT 35.5 (L) 39 - 52 %   MCV 102.9 (H) 80.0 - 100.0 fL   MCH 34.5 (H) 26.0 - 34.0 pg   MCHC 33.5 30.0 - 36.0 g/dL   RDW 13.1 11.5 - 15.5 %   Platelets 173 150 - 400 K/uL   nRBC 0.0 0.0 - 0.2 %   Neutrophils Relative % 70 %   Neutro Abs 7.2 1.7 -  7.7 K/uL   Lymphocytes Relative 17 %   Lymphs Abs 1.7 0.7 - 4.0 K/uL   Monocytes Relative 8 %   Monocytes Absolute 0.8 0 - 1 K/uL   Eosinophils Relative 4 %   Eosinophils Absolute 0.4 0 - 0 K/uL   Basophils Relative 1 %   Basophils Absolute 0.1 0 - 0 K/uL   Immature Granulocytes 0 %   Abs Immature Granulocytes 0.03 0.00 - 0.07 K/uL    Comment: Performed at [facility], [place] [address], [place], [place] [address]  Blood culture (routine x 2)     Status: None (Preliminary result)   Collection Time: 12/02/19  3:43 AM   Specimen: BLOOD RIGHT FOREARM  Result Value Ref Range   Specimen Description      BLOOD RIGHT FOREARM Performed at [facility], [place] [address], [place], [place] [address]    Special Requests      BOTTLES DRAWN AEROBIC AND ANAEROBIC Blood Culture adequate volume Performed at [place] [address], [place], [place] [address]    Culture PENDING    Report Status PENDING   Basic metabolic panel     Status: Abnormal   Collection Time: 12/02/19  3:43 AM  Result Value Ref Range   Sodium 139 135 - 145 mmol/L   Potassium 3.8 3.5 - 5.1 mmol/L   Chloride 94 (L) 98 - 111 mmol/L   CO2 32 22 - 32 mmol/L   Glucose, Bld 192 (H) 70 - 99 mg/dL    Comment: Glucose reference  range applies only to samples taken after fasting for at least 8 hours.   BUN 17 6 - 20 mg/dL   Creatinine, Ser 6.26 (H) 0.61 - 1.24 mg/dL   Calcium 9.0 8.9 - 10.3 mg/dL   GFR calc non Af Amer 10 (L) >60 mL/min   GFR calc Af Amer 11 (L) >60 mL/min   Anion gap 13 5 - 15    Comment: Performed at [facility], [place] [address] [place], [place] [address]  Hemoglobin A1c     Status: Abnormal   Collection Time: 12/02/19  3:43 AM  Result Value Ref Range   Hgb A1c MFr Bld 6.9 (H) 4.8 - 5.6 %    Comment: (NOTE) Pre diabetes:          5.7%-6.4%  Diabetes:              >6.4%  Glycemic control for   <7.0% adults with diabetes    Mean Plasma Glucose 151.33 mg/dL    Comment: Performed at [place] [address] [place], [place] [address]  Lactic acid, plasma     Status: None   Collection Time: 12/02/19  3:44 AM  Result Value Ref Range   Lactic Acid, Venous 0.8 0.5 - 1.9 mmol/L    Comment: Performed at [facility], [place] [address], [place], [place] [address]  SARS Coronavirus 2 by RT PCR (hospital order, performed in [facility] hospital lab) Nasopharyngeal Nasopharyngeal Swab     Status: None   Collection Time: 12/02/19  6:53 AM   Specimen: Nasopharyngeal Swab  Result Value Ref Range   SARS Coronavirus 2 NEGATIVE NEGATIVE    Comment: (NOTE) SARS-CoV-2 target nucleic acids are NOT DETECTED.  The SARS-CoV-2 RNA is generally detectable in upper and lower respiratory specimens during the acute phase of infection. The lowest concentration of SARS-CoV-2 viral copies this assay can detect is 250  copies / mL. A negative result does not preclude SARS-CoV-2 infection and should not be used as the sole basis for treatment or other patient management decisions.  A negative result may occur with improper specimen collection / handling, submission of specimen other than nasopharyngeal swab, presence of viral mutation(s) within the areas targeted by this  assay, and inadequate number of viral copies (<250 copies / mL). A negative result must be combined with clinical observations, patient history, and epidemiological information.  Fact Sheet for Patients:   StrictlyIdeas.no  Fact Sheet for Healthcare Providers: BankingDealers.co.za  This test is not yet approved or  cleared by the [place] FDA and has been authorized for detection and/or diagnosis of SARS-CoV-2 by FDA under an Emergency Use Authorization (EUA).  This EUA will remain in effect (meaning this test can be used) for the duration of the COVID-19 declaration under Section 564(b)(1) of the Act, 21 U.S.C. section 360bbb-3(b)(1), unless the authorization is terminated or revoked sooner.  Performed at [facility], [place] [address] [place], [place] [address]   CBG monitoring, ED     Status: Abnormal   Collection Time: 12/02/19 11:20 AM  Result Value Ref Range   Glucose-Capillary 189 (H) 70 - 99 mg/dL    Comment: Glucose reference range applies only to samples taken after fasting for at least 8 hours.  CBG monitoring, ED     Status: Abnormal   Collection Time: 12/02/19 12:08 PM  Result Value Ref Range   Glucose-Capillary 168 (H) 70 - 99 mg/dL    Comment: Glucose reference range applies only to samples taken after fasting for at least 8 hours.    DG Knee 2 Views Right  Result Date: 12/02/2019 CLINICAL DATA:  Right leg wound. EXAM: RIGHT KNEE - 1-2 VIEW COMPARISON:  12/01/2019.  MRI 10/05/2010. FINDINGS: Prior BKA. Medial compartment degenerative change. No acute bony abnormality identified. No bony erosions noted. Peripheral vascular calcification. No radiopaque foreign body noted. IMPRESSION: 1. Prior BKA. Mild degenerative changes medial compartment. No acute or focal bony abnormality is identified. 2.  Peripheral vascular disease.  No radiopaque foreign body noted. Electronically Signed   By: Marcello Moores   Register   On: 12/02/2019 04:48   MR KNEE RIGHT WO CONTRAST  Result Date: 12/02/2019 CLINICAL DATA:  Pain and swelling. EXAM: MRI OF THE RIGHT KNEE WITHOUT CONTRAST TECHNIQUE: Multiplanar, multisequence MR imaging of the knee was performed. No intravenous contrast was administered. COMPARISON:  Radiographs 12/02/2019 FINDINGS: Evidence of prior below-knee amputation. The osteotomy site of the tibia and fibula appear normal. No findings suspicious for osteomyelitis. Fluid noted around the distal aspect of the tibial stump is likely adventitial bursa formation. Diffuse subcutaneous soft tissue swelling/edema/fluid mainly along the lateral and posterior aspect of the knee and tibia suggesting cellulitis. There is also moderate edema like signal abnormality in the upper calf musculature suggesting nonspecific myositis. No evidence of pyomyositis. There is also fatty atrophy of the muscles. No MR findings suspicious for septic arthritis at the knee joint. Small joint effusion. Normal appearing articular cartilage. No findings for internal derangement. IMPRESSION: 1. Prior below-knee amputation. No findings suspicious for osteomyelitis involving the tibia or fibula or septic arthritis at the knee joint. 2. Diffuse subcutaneous soft tissue swelling/edema/fluid mainly along the lateral and posterior aspect of the knee and tibia suggesting cellulitis. 3. Moderate edema like signal abnormality in the upper calf musculature suggesting nonspecific myositis. No evidence of pyomyositis. 4. Small fluid collection around the tibial stump could be adventitial  bursa formation. Electronically Signed   By: Marijo Sanes M.D.   On: 12/02/2019 07:27   DG Hip Unilat  With Pelvis 2-3 Views Right  Result Date: 12/01/2019 CLINICAL DATA:  Pain EXAM: RIGHT FEMUR 1 VIEW; DG HIP (WITH OR WITHOUT PELVIS) 2-3V RIGHT COMPARISON:  None. FINDINGS: Single view radiograph of the pelvis and two view radiograph of the right hip demonstrate normal  alignment. No fracture or dislocation. No destructive osseous lesion. The femoral head is normally developed and well seated within the right acetabulum. Intramedullary rod noted within the visualized proximal left femoral shaft. The limited evaluation of the left hip is otherwise unremarkable. Advanced vascular calcifications are seen within the pelvis and medial thighs. IMPRESSION: Negative. Electronically Signed   By: Fidela Salisbury MD   On: 12/01/2019 21:10   DG FEMUR 1V RIGHT  Result Date: 12/01/2019 CLINICAL DATA:  Pain EXAM: RIGHT FEMUR 1 VIEW; DG HIP (WITH OR WITHOUT PELVIS) 2-3V RIGHT COMPARISON:  None. FINDINGS: Single view radiograph of the pelvis and two view radiograph of the right hip demonstrate normal alignment. No fracture or dislocation. No destructive osseous lesion. The femoral head is normally developed and well seated within the right acetabulum. Intramedullary rod noted within the visualized proximal left femoral shaft. The limited evaluation of the left hip is otherwise unremarkable. Advanced vascular calcifications are seen within the pelvis and medial thighs. IMPRESSION: Negative. Electronically Signed   By: Fidela Salisbury MD   On: 12/01/2019 21:10    Assessment/Plan **Cellulitis:  rec'd cefepime, vanc; plan vancomycin for course if responding.   **ESRD on HD:  Full tx yesterday, will plan HD at [facility] tomorrow; transferring for such.  UF to EDW, 4hr per usual but 3K with K < 4.   **Anemia of CKD:  Hb 10, not due for ESA. No iron with infection.   **BMM: cont outpt hectorol, binders.    **DM: per primary  **HIV: on biktarvy. Follows with Dr. Drucilla Schmidt.   Justin Mend 12/02/2019, 4:14 PM

## 2019-12-02 NOTE — H&P (Addendum)
History and Physical        Hospital Admission Note Date: 12/02/2019  Patient name: Alvin Daniels Medical record number: [id number] Date of birth: Mar 04, 1971 Age: 49 y.o. Gender: male  PCP: Patient, No Pcp Per  Patient coming from: Home At baseline, ambulates: With prosthesis  Chief Complaint    Chief Complaint  Patient presents with  . Leg Pain      HPI:  History obtained via Spanish interpreter and prior notes  This is a very pleasant 49 year old Spanish-speaking male with past medical history of HIV follows with ID, ESRD on HD (MWF), COVID-19 2 months ago, osteomyelitis s/p bilateral BKA's with prosthesis, type 2 diabetes who presented to the ED with chief complaint of right leg pain x3 days.  Reported a fall about a month ago and subsequently hit his right leg, initially without any complaints however several days ago began having pain in his right thigh standing to right stump.  Took some unknown antibiotic pills left over from a prior prescription at home but ran out of this medication and noticed a wound on his right stump with draining purulent discharge.  Otherwise, denied any fever, chills, night sweats, nausea or vomiting, chest pain or palpitations or any other symptoms.  States that his pain is well controlled currently.  Of note, per ED physician the patient follows with Dr. Drucilla Schmidt, ID and was seen in January 2021 when he had swelling of his right stump.  Was noted to have back pain during dialysis that resolved with stopping dialysis.  Had an MRI of his right BKA due to history of recurrent bacteremia and refer back to Dr. Sharol Given for refitting of his prosthesis.  Pain has been controlled with naproxen at home with the exception of his presenting symptoms.  ED Course: T 99.1 F, otherwise vitals unremarkable.  Notable labs include glucose 192, creatinine 6.26 (had  dialysis yesterday), WBC 10.2, Hb 11.9, lactic acid negative.  He was started on vancomycin and cefepime and MRI of the right knee was ordered which was negative for osteomyelitis or septic arthritis at the knee joint but did show diffuse subcu swelling/edema along the lateral aspect of the knee and tibia suggesting cellulitis with moderate edema in the upper calf musculature suggesting nonspecific myositis.  Vitals:   12/02/19 0650 12/02/19 0720  BP: (!) 144/106 113/69  Pulse: 89 87  Resp:  18  Temp:    SpO2: 96% 94%     Review of Systems:  Review of Systems  Constitutional: Negative for chills and fever.  Respiratory: Negative for cough and shortness of breath.   Cardiovascular: Negative for chest pain and palpitations.  Gastrointestinal: Negative for nausea and vomiting.  Genitourinary: Negative.   Musculoskeletal:       Right lower extremity pain and warmth  Neurological: Negative.  Negative for weakness.  All other systems reviewed and are negative.   Medical/Social/Family History   Past Medical History: Past Medical History:  Diagnosis Date  . AIDS ([place]) 11/22/2014  . Back pain 06/09/2019  . BKA stump complication ([place]) 2/72/5366  . Chronic diarrhea   . Chronic hepatitis C without hepatic coma ([place]) 11/22/2014  . 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  Diabetic neuropathy ([place])   . ESRD (end stage renal disease) on dialysis [facility])    "TTS; don't remember street name" (05/03/2014)  . Hepatitis C   . HIV INFECTION 06/27/2010   Qualifier: Diagnosis of  By: Nickola Major CMA ( [place]), Geni Bers    . Hypotension 06/02/2012  . Metabolic bone disease 09/17/3218  . MRSA infection   . Normocytic anemia 06/17/2012  . Pancreatitis   . Pressure ulcer of BKA stump, stage 2 ([place]) 11/22/2014  . Renal disorder   . Severe protein-calorie malnutrition ([place]) 06/17/2012  . Uncontrolled diabetes mellitus with complications ([place]) 2/54/2706   Annotation: uncontrolled Qualifier: Diagnosis of   By: Nickola Major CMA Deborra Medina), Geni Bers      Past Surgical History:  Procedure Laterality Date  . AMPUTATION Left 04/20/2014   Procedure: 3rd toe amputation, 4th Toe Amputation,  5th Toe Amputation;  Surgeon: Newt Minion, MD;  Location: [place];  Service: Orthopedics;  Laterality: Left;  . AMPUTATION Left 05/02/2014   Procedure: Midfoot Amputation;  Surgeon: Newt Minion, MD;  Location: [place];  Service: Orthopedics;  Laterality: Left;  . AMPUTATION Left 06/17/2014   Procedure: AMPUTATION BELOW KNEE;  Surgeon: Newt Minion, MD;  Location: [place];  Service: Orthopedics;  Laterality: Left;  . AMPUTATION Right 09/04/2018   Procedure: RIGHT BELOW KNEE AMPUTATION;  Surgeon: Newt Minion, MD;  Location: [place];  Service: Orthopedics;  Laterality: Right;  RIGHT BELOW KNEE AMPUTATION  . AV FISTULA PLACEMENT Left   . AV FISTULA PLACEMENT Left 05/10/2016   Procedure: Creation Left Arm Brachiocephalic Arteriovenous Fistula and Ligation of Radiocephalic Fistula;  Surgeon: Angelia Mould, MD;  Location: [place];  Service: Vascular;  Laterality: Left;  . FEMUR IM NAIL Left 08/17/2016   Procedure: INTRAMEDULLARY (IM) RETROGRADE FEMORAL NAILING;  Surgeon: Marybelle Killings, MD;  Location: [place];  Service: Orthopedics;  Laterality: Left;  . FOOT AMPUTATION THROUGH ANKLE Left 12/'21/2015   midfoot  . IR GENERIC HISTORICAL Left 05/01/2016   IR THROMBECTOMY AV FISTULA W/THROMBOLYSIS/PTA INC/SHUNT/IMG LEFT 05/01/2016 [name], MD [facility]  . IR GENERIC HISTORICAL  05/01/2016   IR US GUIDE VASC ACCESS LEFT 05/01/2016 [name], MD [facility]  . IR GENERIC HISTORICAL  05/07/2016   IR FLUORO GUIDE CV LINE RIGHT 05/07/2016 Corrie Mckusick, DO [facility]  . IR GENERIC HISTORICAL  05/07/2016   IR US GUIDE VASC ACCESS RIGHT 05/07/2016 Corrie Mckusick, DO [facility]  . IR GENERIC HISTORICAL  05/22/2016   IR US GUIDE VASC ACCESS RIGHT 05/22/2016 Greggory Keen, MD [facility]  . IR GENERIC HISTORICAL   05/22/2016   IR FLUORO GUIDE CV LINE RIGHT 05/22/2016 Greggory Keen, MD [facility]  . IR REMOVAL TUN CV CATH W/O FL  08/21/2016  . PERIPHERAL VASCULAR CATHETERIZATION Left 05/09/2016   Procedure: A/V Fistulagram;  Surgeon: Angelia Mould, MD;  Location: [place] [facility];  Service: Cardiovascular;  Laterality: Left;  arm  . TEE WITHOUT CARDIOVERSION N/A 06/22/2018   Procedure: TRANSESOPHAGEAL ECHOCARDIOGRAM (TEE);  Surgeon: Jerline Pain, MD;  Location: [facility];  Service: Cardiovascular;  Laterality: N/A;    Medications: Prior to Admission medications   Medication Sig Start Date End Date Taking? Authorizing Provider  bictegravir-emtricitabine-tenofovir AF (BIKTARVY) 50-200-25 MG TABS tablet Take 1 tablet by mouth daily. Patient not taking: Reported on 12/02/2019 06/09/19   Tommy Medal, Lavell Islam, MD  Blood Glucose Monitoring Suppl (TRUE METRIX METER) DEVI 1 each by Does not apply route 3 (  three) times daily. 09/15/18   [name], MD  glucose blood (TRUE METRIX BLOOD GLUCOSE TEST) test strip 1 each by Other route 3 (three) times daily. 09/15/18   [name], MD  HYDROcodone-acetaminophen (NORCO/VICODIN) 5-325 MG tablet Take 1-2 tablets by mouth every 4 (four) hours as needed. Patient not taking: Reported on 12/02/2019 09/26/19   Lacretia Leigh, MD  insulin NPH-regular Human (70-30) 100 UNIT/ML injection Inject 15 Units into the skin 2 (two) times daily with a meal. Patient not taking: Reported on 12/02/2019 09/28/18   [name], MD  Insulin Syringe-Needle U-100 (BD INSULIN SYRINGE ULTRAFINE) 31G X 15/64" 0.5 ML MISC 1 each by Does not apply route 2 (two) times daily. Please dispense needles as well. 09/28/18   [name], MD  metaxalone (SKELAXIN) 800 MG tablet Take 1 tablet (800 mg total) by mouth 3 (three) times daily. Patient not taking: Reported on 12/02/2019 09/26/19   Lacretia Leigh, MD  midodrine (PROAMATINE) 10 MG tablet Take 1 tablet (10 mg total) by mouth Every  Tuesday,Thursday,and Saturday with dialysis. Patient not taking: Reported on 12/02/2019 09/08/18   Lavina Hamman, MD  TRUEplus Lancets 28G MISC 1 each by Does not apply route 3 (three) times daily. 09/15/18   [name], MD    Allergies:  No Known Allergies  Social History:  reports that he has never smoked. He has never used smokeless tobacco. He reports that he does not drink alcohol and does not use drugs.  Family History: Family History  Problem Relation Age of Onset  . Diabetes Mother   . Diabetes Father      Objective   Physical Exam: Blood pressure 113/69, pulse 87, temperature 99.1 F (37.3 C), resp. rate 18, SpO2 94 %.  Physical Exam Vitals and nursing note reviewed.  Constitutional:      Appearance: Normal appearance.  HENT:     Head: Normocephalic and atraumatic.  Eyes:     Conjunctiva/sclera: Conjunctivae normal.  Cardiovascular:     Rate and Rhythm: Normal rate and regular rhythm.  Pulmonary:     Effort: Pulmonary effort is normal.     Breath sounds: Normal breath sounds.  Abdominal:     General: Abdomen is flat.     Palpations: Abdomen is soft.  Musculoskeletal:     Comments: S/p bilateral BKA Right stump with wound without purulent discharge expressed. Warmth to right BKA  Skin:    Coloration: Skin is not jaundiced or pale.  Neurological:     Mental Status: He is alert. Mental status is at baseline.  Psychiatric:        Mood and Affect: Mood normal.        Behavior: Behavior normal.     LABS on Admission: I have personally reviewed all the labs and imaging below    Basic Metabolic Panel: Recent Labs  Lab 12/02/19 0343  NA 139  K 3.8  CL 94*  CO2 32  GLUCOSE 192*  BUN 17  CREATININE 6.26*  CALCIUM 9.0   Liver Function Tests: No results for input(s): AST, ALT, ALKPHOS, BILITOT, PROT, ALBUMIN in the last 168 hours. No results for input(s): LIPASE, AMYLASE in the last 168 hours. No results for input(s): AMMONIA in the last 168  hours. CBC: Recent Labs  Lab 12/02/19 0343  WBC 10.2  NEUTROABS 7.2  HGB 11.9*  HCT 35.5*  MCV 102.9*  PLT 173   Cardiac Enzymes: No results for input(s): CKTOTAL, CKMB, CKMBINDEX, TROPONINI in the last 168 hours.  BNP: Invalid input(s): POCBNP CBG: No results for input(s): GLUCAP in the last 168 hours.  Radiological Exams on Admission:  DG Knee 2 Views Right  Result Date: 12/02/2019 CLINICAL DATA:  Right leg wound. EXAM: RIGHT KNEE - 1-2 VIEW COMPARISON:  12/01/2019.  MRI 10/05/2010. FINDINGS: Prior BKA. Medial compartment degenerative change. No acute bony abnormality identified. No bony erosions noted. Peripheral vascular calcification. No radiopaque foreign body noted. IMPRESSION: 1. Prior BKA. Mild degenerative changes medial compartment. No acute or focal bony abnormality is identified. 2.  Peripheral vascular disease.  No radiopaque foreign body noted. Electronically Signed   By: Marcello Moores  Register   On: 12/02/2019 04:48   MR KNEE RIGHT WO CONTRAST  Result Date: 12/02/2019 CLINICAL DATA:  Pain and swelling. EXAM: MRI OF THE RIGHT KNEE WITHOUT CONTRAST TECHNIQUE: Multiplanar, multisequence MR imaging of the knee was performed. No intravenous contrast was administered. COMPARISON:  Radiographs 12/02/2019 FINDINGS: Evidence of prior below-knee amputation. The osteotomy site of the tibia and fibula appear normal. No findings suspicious for osteomyelitis. Fluid noted around the distal aspect of the tibial stump is likely adventitial bursa formation. Diffuse subcutaneous soft tissue swelling/edema/fluid mainly along the lateral and posterior aspect of the knee and tibia suggesting cellulitis. There is also moderate edema like signal abnormality in the upper calf musculature suggesting nonspecific myositis. No evidence of pyomyositis. There is also fatty atrophy of the muscles. No MR findings suspicious for septic arthritis at the knee joint. Small joint effusion. Normal appearing articular  cartilage. No findings for internal derangement. IMPRESSION: 1. Prior below-knee amputation. No findings suspicious for osteomyelitis involving the tibia or fibula or septic arthritis at the knee joint. 2. Diffuse subcutaneous soft tissue swelling/edema/fluid mainly along the lateral and posterior aspect of the knee and tibia suggesting cellulitis. 3. Moderate edema like signal abnormality in the upper calf musculature suggesting nonspecific myositis. No evidence of pyomyositis. 4. Small fluid collection around the tibial stump could be adventitial bursa formation. Electronically Signed   By: Marijo Sanes M.D.   On: 12/02/2019 07:27   DG Hip Unilat  With Pelvis 2-3 Views Right  Result Date: 12/01/2019 CLINICAL DATA:  Pain EXAM: RIGHT FEMUR 1 VIEW; DG HIP (WITH OR WITHOUT PELVIS) 2-3V RIGHT COMPARISON:  None. FINDINGS: Single view radiograph of the pelvis and two view radiograph of the right hip demonstrate normal alignment. No fracture or dislocation. No destructive osseous lesion. The femoral head is normally developed and well seated within the right acetabulum. Intramedullary rod noted within the visualized proximal left femoral shaft. The limited evaluation of the left hip is otherwise unremarkable. Advanced vascular calcifications are seen within the pelvis and medial thighs. IMPRESSION: Negative. Electronically Signed   By: Fidela Salisbury MD   On: 12/01/2019 21:10   DG FEMUR 1V RIGHT  Result Date: 12/01/2019 CLINICAL DATA:  Pain EXAM: RIGHT FEMUR 1 VIEW; DG HIP (WITH OR WITHOUT PELVIS) 2-3V RIGHT COMPARISON:  None. FINDINGS: Single view radiograph of the pelvis and two view radiograph of the right hip demonstrate normal alignment. No fracture or dislocation. No destructive osseous lesion. The femoral head is normally developed and well seated within the right acetabulum. Intramedullary rod noted within the visualized proximal left femoral shaft. The limited evaluation of the left hip is otherwise  unremarkable. Advanced vascular calcifications are seen within the pelvis and medial thighs. IMPRESSION: Negative. Electronically Signed   By: Fidela Salisbury MD   On: 12/01/2019 21:10      EKG: Not done in ED  A & P   Active Problems:   Amputation stump infection ([place])   1. Right BKA cellulitis and wound infection with history of bilateral BKA a. Not septic b. MRI right knee without osteomyelitis c. Per Dr. Sharol Given, Ortho-no surgical intervention d. Discussed with Dr. Drucilla Schmidt, patient's HIV ID outpatient doc, okay to treat with vancomycin monotherapy and monitor and send out on vancomycin with HD if he responds  2. ESRD on HD MWF a. Last treatment was yesterday b. Creatinine 6.26 today c. Nephrology consulted, transfer to [facility] for HD tomorrow d. Renal diet  3. Diabetes a. HA1C b. Sliding-scale c. Carb controlled diet  4. HIV a. Follows with Dr. Drucilla Schmidt b. Continue Biktarvy   DVT prophylaxis: Heparin   Code Status: Prior  Diet: Renal/carb control Family Communication: Admission, patients condition and plan of care including tests being ordered have been discussed with the patient who indicates understanding and agrees with the plan and Code Status.   Disposition Plan: The appropriate patient status for this patient is INPATIENT. Inpatient status is judged to be reasonable and necessary in order to provide the required intensity of service to ensure the patient's safety. The patient's presenting symptoms, physical exam findings, and initial radiographic and laboratory data in the context of their chronic comorbidities is felt to place them at high risk for further clinical deterioration. Furthermore, it is not anticipated that the patient will be medically stable for discharge from the hospital within 2 midnights of admission. The following factors support the patient status of inpatient.   " The patient's presenting symptoms include right leg pain and warmth. " The  worrisome physical exam findings include right lower extremity warmth with wound. " The initial radiographic and laboratory data are worrisome because of edema and cellulitis on MRI. " The chronic co-morbidities include ESRD, diabetes, MRSA bacteremia in the past.   * I certify that at the point of admission it is my clinical judgment that the patient will require inpatient hospital care spanning beyond 2 midnights from the point of admission due to high intensity of service, high risk for further deterioration and high frequency of surveillance required.*   Status is: Inpatient  Remains inpatient appropriate because:IV treatments appropriate due to intensity of illness or inability to take PO   Dispo: The patient is from: Home              Anticipated d/c is to: Home              Anticipated d/c date is: 2 days              Patient currently is not medically stable to d/c.     Consultants  . Nephrology . Discussed with Dr. Sharol Given, Ortho . Discussed with Dr. Drucilla Schmidt, ID  Procedures  . None  Time Spent on Admission: 76 minutes    Harold Hedge, DO [facility] Pager [phone] 12/02/2019, 9:18 AM

## 2019-12-03 DIAGNOSIS — B20 Human immunodeficiency virus [HIV] disease: Secondary | ICD-10-CM

## 2019-12-03 DIAGNOSIS — L03115 Cellulitis of right lower limb: Principal | ICD-10-CM

## 2019-12-03 DIAGNOSIS — T8740 Infection of amputation stump, unspecified extremity: Secondary | ICD-10-CM

## 2019-12-03 LAB — GLUCOSE, CAPILLARY
Glucose-Capillary: 172 mg/dL — ABNORMAL HIGH (ref 70–99)
Glucose-Capillary: 114 mg/dL — ABNORMAL HIGH (ref 70–99)

## 2019-12-03 LAB — CBC
HCT: 33.4 % — ABNORMAL LOW (ref 39.0–52.0)
Hemoglobin: 11.2 g/dL — ABNORMAL LOW (ref 13.0–17.0)
MCH: 34.1 pg — ABNORMAL HIGH (ref 26.0–34.0)
MCHC: 33.5 g/dL (ref 30.0–36.0)
MCV: 101.8 fL — ABNORMAL HIGH (ref 80.0–100.0)
Platelets: 172 10*3/uL (ref 150–400)
RBC: 3.28 MIL/uL — ABNORMAL LOW (ref 4.22–5.81)
RDW: 12.9 % (ref 11.5–15.5)
WBC: 9.6 10*3/uL (ref 4.0–10.5)
nRBC: 0 % (ref 0.0–0.2)

## 2019-12-03 LAB — BASIC METABOLIC PANEL
Anion gap: 15 (ref 5–15)
BUN: 24 mg/dL — ABNORMAL HIGH (ref 6–20)
CO2: 28 mmol/L (ref 22–32)
Calcium: 8.9 mg/dL (ref 8.9–10.3)
Chloride: 93 mmol/L — ABNORMAL LOW (ref 98–111)
Creatinine, Ser: 9.06 mg/dL — ABNORMAL HIGH (ref 0.61–1.24)
GFR calc Af Amer: 7 mL/min — ABNORMAL LOW (ref >60)
GFR calc non Af Amer: 6 mL/min — ABNORMAL LOW (ref >60)
Glucose, Bld: 179 mg/dL — ABNORMAL HIGH (ref 70–99)
Potassium: 4 mmol/L (ref 3.5–5.1)
Sodium: 136 mmol/L (ref 135–145)

## 2019-12-03 LAB — MRSA PCR SCREENING: MRSA by PCR: POSITIVE — AB

## 2019-12-03 MED ORDER — ACETAMINOPHEN 325 MG PO TABS
ORAL_TABLET | ORAL | Status: AC
  Filled 2019-12-03: qty 2

## 2019-12-03 MED ORDER — CHLORHEXIDINE GLUCONATE CLOTH 2 % EX PADS
6.0000 | MEDICATED_PAD | Freq: Every day | CUTANEOUS | Status: DC
  Administered 2019-12-03: 6 via TOPICAL

## 2019-12-03 MED ORDER — MUPIROCIN 2 % EX OINT
1.0000 "application " | TOPICAL_OINTMENT | Freq: Two times a day (BID) | CUTANEOUS | Status: DC
  Administered 2019-12-03: 1 via NASAL
  Filled 2019-12-03: qty 22

## 2019-12-03 MED ORDER — VANCOMYCIN HCL 500 MG/100ML IV SOLN
500.0000 mg | Freq: Once | INTRAVENOUS | Status: AC
  Administered 2019-12-03: 500 mg via INTRAVENOUS
  Filled 2019-12-03 (×2): qty 100

## 2019-12-03 MED ORDER — ALBUMIN HUMAN 25 % IV SOLN: 25.0000 g | Freq: Once | INTRAVENOUS | Status: AC

## 2019-12-03 MED ORDER — VANCOMYCIN HCL IN DEXTROSE 500-5 MG/100ML-% IV SOLN: 500.0000 mg | INTRAVENOUS | Status: DC

## 2019-12-03 MED ORDER — CEPHALEXIN 500 MG PO CAPS: 500.0000 mg | ORAL_CAPSULE | Freq: Two times a day (BID) | ORAL | 0 refills | Status: DC

## 2019-12-03 MED ORDER — DOXERCALCIFEROL 4 MCG/2ML IV SOLN
INTRAVENOUS | Status: AC
  Filled 2019-12-03: qty 2

## 2019-12-03 MED ORDER — ALBUMIN HUMAN 25 % IV SOLN
INTRAVENOUS | Status: AC
  Administered 2019-12-03: 25 g via INTRAVENOUS
  Filled 2019-12-03: qty 100

## 2019-12-03 NOTE — Progress Notes (Signed)
Pharmacy Antibiotic Note  Alvin Daniels is a 49 y.o. male admitted on 12/02/2019 with wound infection.  Pharmacy has been consulted for vanc dosing. Note patient is s/p bilateral BKAs, currently receives HD on MWF per notes, and with hx HIV. Note pt weight of 52kg 2 months ago per chart  Plan:  Vancomycin 1g x 1 then will schedule vanc 500mg  IV x 1 to be given today post HD session, patient to continue vancomycin 500mg  IV after each dialysis starting 7/26 if still here.   Cefepime d/c'd at suggestion of ID  Weight: 51.7 kg (113 lb 15.7 oz)  Temp (24hrs), Avg:98.5 F (36.9 C), Min:98 F (36.7 C), Max:98.9 F (37.2 C)  Recent Labs  Lab 12/02/19 0343 12/02/19 0344 12/03/19 0448  WBC 10.2  --  9.6  CREATININE 6.26*  --  9.06*  LATICACIDVEN  --  0.8  --     Estimated Creatinine Clearance: 7 mL/min (A) (by C-G formula based on SCr of 9.06 mg/dL (H)).    No Known Allergies    Alvin Daniels A. Levada Dy, PharmD, BCPS, FNKF Clinical Pharmacist [place] Please utilize Amion for appropriate phone number to reach the unit pharmacist ([place])   12/03/2019 10:45 AM

## 2019-12-03 NOTE — Progress Notes (Signed)
New Admission Note:   Arrival Method: transfer from [facility] long via [place] Orientation: Alert & oriented x4 Telemetry: 5M05, CCMD notified Assessment: to be completed Skin: refer to flowsheet IV: RFA, saline locked Pain: 8/10, medication igiven Tubes: None Safety Measures: Safety Fall Prevention Plan has been discussed  Admission: to be completed 5 [place] Orientation: Patient has been orientated to the room, unit and staff.   Family: none at bedside  Orders to be reviewed and implemented. Will continue to monitor the patient. Call light has been placed within reach and bed alarm has been activated.

## 2019-12-03 NOTE — Discharge Summary (Signed)
Physician Discharge Summary  Alvin Daniels KDT:[id number] DOB: [birth year] DOA: 12/02/2019  PCP: Patient, No Pcp Per  Admit date: 12/02/2019 Discharge date: 12/03/2019  Admitted From: Home Disposition: Home  Recommendations for Outpatient Follow-up:  1. Follow up with PCP in 1-2 weeks 2. Please obtain BMP/CBC in one week 3. Please follow up with your PCP on the following pending results: Unresulted Labs (From admission, onward) Comment          Start     Ordered   Signed and Held  CBC  Once,   R        Signed and [place]: None Equipment/Devices: None  Discharge Condition: Stable CODE STATUS: Full code Diet recommendation: Renal  Subjective: Seen and examined in dialysis unit.  He has no complaints.  Denied any pain in the right stump.  Ready to go home.  This is a very pleasant 50 year old Spanish-speaking male with past medical history of HIV follows with ID, ESRD on HD (MWF), COVID-19 2 months ago, osteomyelitis s/p bilateral BKA's with prosthesis, type 2 diabetes who presented to the ED with chief complaint of right leg pain x3 days.  Reported a fall about a month ago and subsequently hit his right leg, initially without any complaints however several days ago began having pain in his right thigh standing to right stump.  Took some unknown antibiotic pills left over from a prior prescription at home but ran out of this medication and noticed a wound on his right stump with draining purulent discharge.  Otherwise, denied any fever, chills, night sweats, nausea or vomiting, chest pain or palpitations or any other symptoms.  States that his pain is well controlled currently.  Of note, per ED physician the patient follows with Dr. Drucilla Schmidt, ID and was seen in January 2021 when he had swelling of his right stump.  Was noted to have back pain during dialysis that resolved with stopping dialysis.  Had an MRI of his right BKA due to history of recurrent  bacteremia and refer back to Dr. Sharol Given for refitting of his prosthesis.  Pain has been controlled with naproxen at home with the exception of his presenting symptoms.  ED Course: T 99.1 F, otherwise vitals unremarkable.  Notable labs include glucose 192, creatinine 6.26 (had dialysis yesterday), WBC 10.2, Hb 11.9, lactic acid negative.  He was started on vancomycin and cefepime and MRI of the right knee was ordered which was negative for osteomyelitis or septic arthritis at the knee joint but did show diffuse subcu swelling/edema along the lateral aspect of the knee and tibia suggesting cellulitis with moderate edema in the upper calf musculature suggesting nonspecific myositis.  Brief/Interim Summary: Patient initially presented to [facility] emergency department with right lower extremity pain.  MRI was done which ruled out any osteomyelitis but it showed some signs of cellulitis.  Due to requirement of hemodialysis, he was transferred/admitted at [facility].  I saw this gentleman this morning in dialysis unit.  He had no complaint.  Denied any pain.  On examination of the right stump, he had superficial shallow ulcer with no bleeding or any drainage, it was nontender and no warmth.  He did not have any signs of cellulitis.  He had dark brown to skin so he might be having minimal cellulitis which is not appreciable either.  Admitting hospitalist had discussed his case with his regular ID physician Dr. Tommy Medal who had recommended  continuing vancomycin and discharging on vancomycin if he shows improvement.  I personally discussed the case with Dr. Drucilla Schmidt today and took patient's wounds picture as well.  He does not have any purulent discharge and has none to minimal cellulitis signs.  After discussion with him, he agrees that patient can be discharged on Keflex.  Patient also agreeable with this plan.  He was also seen by nurse practitioner of orthopedic/Dr. Sharol Given.  She also did not appreciate any signs  of cellulitis or abscess.  He will follow up with his regular physicians as outpatient and is being discharged in stable condition.  He received his regular dialysis today.  Discharge Diagnoses:  Active Problems:   Diabetes mellitus with ESRD (end-stage renal disease) ([place])   Cellulitis of right lower extremity   HIV disease ([place])   S/P bilateral BKA (below knee amputation) ([place])   HIV (human immunodeficiency virus infection) ([place])   Amputation stump infection ([place])    Discharge Instructions   Allergies as of 12/03/2019   No Known Allergies     Medication List    STOP taking these medications   HYDROcodone-acetaminophen 5-325 MG tablet Commonly known as: NORCO/VICODIN   insulin NPH-regular Human (70-30) 100 UNIT/ML injection   metaxalone 800 MG tablet Commonly known as: SKELAXIN     TAKE these medications   Biktarvy 50-200-25 MG Tabs tablet Generic drug: bictegravir-emtricitabine-tenofovir AF Take 1 tablet by mouth daily.   cephALEXin 500 MG capsule Commonly known as: KEFLEX Take 1 capsule (500 mg total) by mouth 2 (two) times daily for 7 days.   glucose blood test strip Commonly known as: True Metrix Blood Glucose Test 1 each by Other route 3 (three) times daily.   Insulin Syringe-Needle U-100 31G X 15/64" 0.5 ML Misc Commonly known as: BD Insulin Syringe Ultrafine 1 each by Does not apply route 2 (two) times daily. Please dispense needles as well.   midodrine 10 MG tablet Commonly known as: PROAMATINE Take 1 tablet (10 mg total) by mouth Every Tuesday,Thursday,and Saturday with dialysis.   True Metrix Meter Devi 1 each by Does not apply route 3 (three) times daily.   TRUEplus Lancets 28G Misc 1 each by Does not apply route 3 (three) times daily.       No Known Allergies  Consultations: Orthopedics and ID and nephrology   Procedures/Studies: DG Knee 2 Views Right  Result Date: 12/02/2019 CLINICAL DATA:  Right leg wound. EXAM: RIGHT KNEE - 1-2 VIEW  COMPARISON:  12/01/2019.  MRI 10/05/2010. FINDINGS: Prior BKA. Medial compartment degenerative change. No acute bony abnormality identified. No bony erosions noted. Peripheral vascular calcification. No radiopaque foreign body noted. IMPRESSION: 1. Prior BKA. Mild degenerative changes medial compartment. No acute or focal bony abnormality is identified. 2.  Peripheral vascular disease.  No radiopaque foreign body noted. Electronically Signed   By: Marcello Moores  Register   On: 12/02/2019 04:48   MR KNEE RIGHT WO CONTRAST  Result Date: 12/02/2019 CLINICAL DATA:  Pain and swelling. EXAM: MRI OF THE RIGHT KNEE WITHOUT CONTRAST TECHNIQUE: Multiplanar, multisequence MR imaging of the knee was performed. No intravenous contrast was administered. COMPARISON:  Radiographs 12/02/2019 FINDINGS: Evidence of prior below-knee amputation. The osteotomy site of the tibia and fibula appear normal. No findings suspicious for osteomyelitis. Fluid noted around the distal aspect of the tibial stump is likely adventitial bursa formation. Diffuse subcutaneous soft tissue swelling/edema/fluid mainly along the lateral and posterior aspect of the knee and tibia suggesting cellulitis. There is also moderate edema  like signal abnormality in the upper calf musculature suggesting nonspecific myositis. No evidence of pyomyositis. There is also fatty atrophy of the muscles. No MR findings suspicious for septic arthritis at the knee joint. Small joint effusion. Normal appearing articular cartilage. No findings for internal derangement. IMPRESSION: 1. Prior below-knee amputation. No findings suspicious for osteomyelitis involving the tibia or fibula or septic arthritis at the knee joint. 2. Diffuse subcutaneous soft tissue swelling/edema/fluid mainly along the lateral and posterior aspect of the knee and tibia suggesting cellulitis. 3. Moderate edema like signal abnormality in the upper calf musculature suggesting nonspecific myositis. No evidence of  pyomyositis. 4. Small fluid collection around the tibial stump could be adventitial bursa formation. Electronically Signed   By: Marijo Sanes M.D.   On: 12/02/2019 07:27   DG Hip Unilat  With Pelvis 2-3 Views Right  Result Date: 12/01/2019 CLINICAL DATA:  Pain EXAM: RIGHT FEMUR 1 VIEW; DG HIP (WITH OR WITHOUT PELVIS) 2-3V RIGHT COMPARISON:  None. FINDINGS: Single view radiograph of the pelvis and two view radiograph of the right hip demonstrate normal alignment. No fracture or dislocation. No destructive osseous lesion. The femoral head is normally developed and well seated within the right acetabulum. Intramedullary rod noted within the visualized proximal left femoral shaft. The limited evaluation of the left hip is otherwise unremarkable. Advanced vascular calcifications are seen within the pelvis and medial thighs. IMPRESSION: Negative. Electronically Signed   By: Fidela Salisbury MD   On: 12/01/2019 21:10   DG FEMUR 1V RIGHT  Result Date: 12/01/2019 CLINICAL DATA:  Pain EXAM: RIGHT FEMUR 1 VIEW; DG HIP (WITH OR WITHOUT PELVIS) 2-3V RIGHT COMPARISON:  None. FINDINGS: Single view radiograph of the pelvis and two view radiograph of the right hip demonstrate normal alignment. No fracture or dislocation. No destructive osseous lesion. The femoral head is normally developed and well seated within the right acetabulum. Intramedullary rod noted within the visualized proximal left femoral shaft. The limited evaluation of the left hip is otherwise unremarkable. Advanced vascular calcifications are seen within the pelvis and medial thighs. IMPRESSION: Negative. Electronically Signed   By: Fidela Salisbury MD   On: 12/01/2019 21:10      Discharge Exam: Vitals:   12/03/19 1000 12/03/19 1030  BP: (!) 97/54 (!) 93/57  Pulse: 80 87  Resp: 17 20  Temp:    SpO2:     Vitals:   12/03/19 0900 12/03/19 0930 12/03/19 1000 12/03/19 1030  BP: (!) 76/59 (!) 99/59 (!) 97/54 (!) 93/57  Pulse: 60 79 80 87  Resp: 15  14 17 20   Temp:      TempSrc:      SpO2:      Weight:        General: Pt is alert, awake, not in acute distress Cardiovascular: RRR, S1/S2 +, no rubs, no gallops Respiratory: CTA bilaterally, no wheezing, no rhonchi Abdominal: Soft, NT, ND, bowel sounds + Extremities: Bilateral BKA.  Superficial shallow ulcer at right stump with no drainage.  No warmth and no tenderness.    The results of significant diagnostics from this hospitalization (including imaging, microbiology, ancillary and laboratory) are listed below for reference.     Microbiology: Recent Results (from the past 240 hour(s))  Blood culture (routine x 2)     Status: None (Preliminary result)   Collection Time: 12/02/19  3:43 AM   Specimen: BLOOD RIGHT FOREARM  Result Value Ref Range Status   Specimen Description   Final    BLOOD RIGHT  FOREARM Performed at [facility], [place] [address], [place], [place] [address]    Special Requests   Final    BOTTLES DRAWN AEROBIC AND ANAEROBIC Blood Culture adequate volume Performed at [facility] [address], [place], [place] [address]    Culture   Final    NO GROWTH 1 DAY Performed at [facility], [place] [address], [place], [place] [address]    Report Status PENDING  Incomplete  Blood culture (routine x 2)     Status: None (Preliminary result)   Collection Time: 12/02/19  3:44 AM   Specimen: BLOOD LEFT HAND  Result Value Ref Range Status   Specimen Description   Final    BLOOD LEFT HAND Performed at [facility] [address], [place], [place] [address]    Special Requests   Final    BOTTLES DRAWN AEROBIC AND ANAEROBIC Blood Culture adequate volume Performed at [facility] [address] [place], [place] [address]    Culture   Final    NO GROWTH 1 DAY Performed at [facility], [place] [address], [place], [place] [address]    Report Status PENDING  Incomplete  SARS Coronavirus 2 by RT  PCR (hospital order, performed in [facility] hospital lab) Nasopharyngeal Nasopharyngeal Swab     Status: None   Collection Time: 12/02/19  6:53 AM   Specimen: Nasopharyngeal Swab  Result Value Ref Range Status   SARS Coronavirus 2 NEGATIVE NEGATIVE Final    Comment: (NOTE) SARS-CoV-2 target nucleic acids are NOT DETECTED.  The SARS-CoV-2 RNA is generally detectable in upper and lower respiratory specimens during the acute phase of infection. The lowest concentration of SARS-CoV-2 viral copies this assay can detect is 250 copies / mL. A negative result does not preclude SARS-CoV-2 infection and should not be used as the sole basis for treatment or other patient management decisions.  A negative result may occur with improper specimen collection / handling, submission of specimen other than nasopharyngeal swab, presence of viral mutation(s) within the areas targeted by this assay, and inadequate number of viral copies (<250 copies / mL). A negative result must be combined with clinical observations, patient history, and epidemiological information.  Fact Sheet for Patients:   StrictlyIdeas.no  Fact Sheet for Healthcare Providers: BankingDealers.co.za  This test is not yet approved or  cleared by the [place] FDA and has been authorized for detection and/or diagnosis of SARS-CoV-2 by FDA under an Emergency Use Authorization (EUA).  This EUA will remain in effect (meaning this test can be used) for the duration of the COVID-19 declaration under Section 564(b)(1) of the Act, 21 U.S.C. section 360bbb-3(b)(1), unless the authorization is terminated or revoked sooner.  Performed at [facility], [place] [address], [place], [place] [address]   MRSA PCR Screening     Status: Abnormal   Collection Time: 12/02/19 10:57 PM   Specimen: Nasopharyngeal  Result Value Ref Range Status   MRSA by PCR POSITIVE (A) NEGATIVE Final     Comment:        The GeneXpert MRSA Assay (FDA approved for NASAL specimens only), is one component of a comprehensive MRSA colonization surveillance program. It is not intended to diagnose MRSA infection nor to guide or monitor treatment for MRSA infections. RESULT CALLED TO, READ BACK BY AND VERIFIED WITHPhil Dopp RN 12/03/19 0056 JDW Performed at [facility], [place] [address], [place], [place]  [address]      Labs: BNP (last 3 results) No results for input(s): BNP in the last 8760 hours. Basic Metabolic Panel: Recent Labs  Lab 12/02/19 0343 12/03/19 0448  NA 139 136  K 3.8 4.0  CL 94* 93*  CO2 32 28  GLUCOSE 192* 179*  BUN 17 24*  CREATININE 6.26* 9.06*  CALCIUM 9.0 8.9   Liver Function Tests: No results for input(s): AST, ALT, ALKPHOS, BILITOT, PROT, ALBUMIN in the last 168 hours. No results for input(s): LIPASE, AMYLASE in the last 168 hours. No results for input(s): AMMONIA in the last 168 hours. CBC: Recent Labs  Lab 12/02/19 0343 12/03/19 0448  WBC 10.2 9.6  NEUTROABS 7.2  --   HGB 11.9* 11.2*  HCT 35.5* 33.4*  MCV 102.9* 101.8*  PLT 173 172   Cardiac Enzymes: No results for input(s): CKTOTAL, CKMB, CKMBINDEX, TROPONINI in the last 168 hours. BNP: Invalid input(s): POCBNP CBG: Recent Labs  Lab 12/02/19 1208 12/02/19 1743 12/02/19 1810 12/02/19 2254 12/03/19 0635  GLUCAP 168* 253* 225* 170* 172*   D-Dimer No results for input(s): DDIMER in the last 72 hours. Hgb A1c Recent Labs    12/02/19 0343  HGBA1C 6.9*   Lipid Profile No results for input(s): CHOL, HDL, LDLCALC, TRIG, CHOLHDL, LDLDIRECT in the last 72 hours. Thyroid function studies No results for input(s): TSH, T4TOTAL, T3FREE, THYROIDAB in the last 72 hours.  Invalid input(s): FREET3 Anemia work up No results for input(s): VITAMINB12, FOLATE, FERRITIN, TIBC, IRON, RETICCTPCT in the last 72 hours. Urinalysis    Component Value Date/Time   COLORURINE YELLOW  07/04/2010 1840   APPEARANCEUR TURBID (A) 07/04/2010 1840   LABSPEC 1.013 07/04/2010 1840   PHURINE 7.0 07/04/2010 1840   GLUCOSEU NEG mg/dL 07/04/2010 1840   HGBUR TRACE (A) 03/13/2010 1824   BILIRUBINUR NEG 07/04/2010 1840   KETONESUR NEG mg/dL 07/04/2010 1840   PROTEINUR > 300 mg/dL (A) 07/04/2010 1840   UROBILINOGEN 0.2 07/04/2010 1840   NITRITE NEG 07/04/2010 1840   LEUKOCYTESUR LARGE (A) 07/04/2010 1840   Sepsis Labs Invalid input(s): PROCALCITONIN,  WBC,  LACTICIDVEN Microbiology Recent Results (from the past 240 hour(s))  Blood culture (routine x 2)     Status: None (Preliminary result)   Collection Time: 12/02/19  3:43 AM   Specimen: BLOOD RIGHT FOREARM  Result Value Ref Range Status   Specimen Description   Final    BLOOD RIGHT FOREARM Performed at [facility], [place] [address], [place], [place] [address]    Special Requests   Final    BOTTLES DRAWN AEROBIC AND ANAEROBIC Blood Culture adequate volume Performed at [facility] [address], [place], [place] [address]    Culture   Final    NO GROWTH 1 DAY Performed at [facility], [place] [address], [place], [place] [address]    Report Status PENDING  Incomplete  Blood culture (routine x 2)     Status: None (Preliminary result)   Collection Time: 12/02/19  3:44 AM   Specimen: BLOOD LEFT HAND  Result Value Ref Range Status   Specimen Description   Final    BLOOD LEFT HAND Performed at [facility] [address], [place], [place] [address]    Special Requests   Final    BOTTLES DRAWN AEROBIC AND ANAEROBIC Blood Culture adequate volume Performed at [facility] [address], [place], [place] [address]    Culture   Final  NO GROWTH 1 DAY Performed at [facility], [place] [address], [place], [place] [address]    Report Status PENDING  Incomplete  SARS Coronavirus 2 by RT PCR (hospital order, performed in [facility] hospital lab)  Nasopharyngeal Nasopharyngeal Swab     Status: None   Collection Time: 12/02/19  6:53 AM   Specimen: Nasopharyngeal Swab  Result Value Ref Range Status   SARS Coronavirus 2 NEGATIVE NEGATIVE Final    Comment: (NOTE) SARS-CoV-2 target nucleic acids are NOT DETECTED.  The SARS-CoV-2 RNA is generally detectable in upper and lower respiratory specimens during the acute phase of infection. The lowest concentration of SARS-CoV-2 viral copies this assay can detect is 250 copies / mL. A negative result does not preclude SARS-CoV-2 infection and should not be used as the sole basis for treatment or other patient management decisions.  A negative result may occur with improper specimen collection / handling, submission of specimen other than nasopharyngeal swab, presence of viral mutation(s) within the areas targeted by this assay, and inadequate number of viral copies (<250 copies / mL). A negative result must be combined with clinical observations, patient history, and epidemiological information.  Fact Sheet for Patients:   StrictlyIdeas.no  Fact Sheet for Healthcare Providers: BankingDealers.co.za  This test is not yet approved or  cleared by the [place] FDA and has been authorized for detection and/or diagnosis of SARS-CoV-2 by FDA under an Emergency Use Authorization (EUA).  This EUA will remain in effect (meaning this test can be used) for the duration of the COVID-19 declaration under Section 564(b)(1) of the Act, 21 U.S.C. section 360bbb-3(b)(1), unless the authorization is terminated or revoked sooner.  Performed at [facility], [place] [address], [place], [place] [address]   MRSA PCR Screening     Status: Abnormal   Collection Time: 12/02/19 10:57 PM   Specimen: Nasopharyngeal  Result Value Ref Range Status   MRSA by PCR POSITIVE (A) NEGATIVE Final    Comment:        The GeneXpert MRSA Assay  (FDA approved for NASAL specimens only), is one component of a comprehensive MRSA colonization surveillance program. It is not intended to diagnose MRSA infection nor to guide or monitor treatment for MRSA infections. RESULT CALLED TO, READ BACK BY AND VERIFIED WITHPhil Dopp RN 12/03/19 0056 JDW Performed at [facility], [place] [address], [place], [place] [address]      Time coordinating discharge: Over 30 minutes  SIGNED:   Darliss Cheney, MD  [facility] 12/03/2019, 10:42 AM  If 7PM-7AM, please contact night-coverage [email or web address]

## 2019-12-03 NOTE — Progress Notes (Signed)
Alvin Daniels to be D/C'd Home per MD order.  Discussed with the patient and all questions fully answered.  VSS, Skin clean, dry and intact without evidence of skin break down, no evidence of skin tears noted. IV catheter discontinued intact. Site without signs and symptoms of complications. Dressing and pressure applied.  An After Visit Summary was printed and given to the patient. Patient received prescription.  D/c education completed with patient/family including follow up instructions, medication list, d/c activities limitations if indicated, with other d/c instructions as indicated by MD - patient able to verbalize understanding, all questions fully answered. Translator used for all teaching.   Patient instructed to return to ED, call 911, or call MD for any changes in condition.   Patient escorted via [place], and D/C home via private auto.  Alvin Daniels 12/03/2019 4:14 PM

## 2019-12-03 NOTE — Progress Notes (Signed)
Orthopedic Tech Progress Note Patient Details:  Alvin Daniels 1970-11-06 [id number] Ordered outside vendor brace Patient ID: Alvin Daniels, male   DOB: Dec 06, 1970, 49 y.o.   MRN: [id number]   [name] 12/03/2019, 8:37 AM

## 2019-12-03 NOTE — Progress Notes (Signed)
Patient off unit in dialysis.

## 2019-12-03 NOTE — TOC Transition Note (Signed)
Transition of Care [facility]) - CM/SW Discharge Note   Patient Details  Name: Alvin Daniels MRN: [id number] Date of Birth: 1971/02/14  Transition of Care [facility]) CM/SW Contact:  Bartholomew Crews, RN Phone Number: [phone] 12/03/2019, 3:53 PM   Clinical Narrative:     Patient transitioning home today. Hospital follow up appointment scheduled at [facility] for 02/14/2020 at 8:50. Patient to pick up antibiotic prescription at [facility]. Unable to verify out of pocket cost with pharmacy. Nurse advised patient of where to pick up prescription. No further TOC needs identified.   Final next level of care: Home/Self Care Barriers to Discharge: No Barriers Identified   Patient Goals and CMS Choice        Discharge Placement                       Discharge Plan and Services                                     Social Determinants of Health (SDOH) Interventions     Readmission Risk Interventions Readmission Risk Prevention Plan 09/03/2018  PCP or Specialist Appt within 3-5 Days Complete  Some recent data might be hidden

## 2019-12-03 NOTE — Procedures (Signed)
I was present at this dialysis session, have reviewed the session itself and made  appropriate changes  Tolerating well.  Says leg pain gone.  For D/C today  **Cellulitis:  rec'd cefepime, vanc; plan vancomycin 0.5g qtx now --> rec 6 doses outpt given quite severe.    **ESRD on HD:  HD today then ok to d/c.  Generally MWF, no issues with tx today.   **Anemia of CKD:  Hb 10, not due for ESA. No iron with infection.   **BMM: cont outpt hectorol, binders.    **DM: per primary  **HIV: on biktarvy. Follows with Dr. Drucilla Schmidt.    Jannifer Hick MD [place] pager [phone]   12/03/2019, 10:48 AM

## 2019-12-03 NOTE — Progress Notes (Signed)
This is a pleasant 49 year old gentleman seen in dialysis today.  Last seen by Dr. Sharol Given in our office in June 2020 he is status post transtibial amputation in April 2020.  At his visit in June he had sustained a fall and was to follow-up in 4 weeks.  Patient's family did contact the office recently and asked about getting a prosthetic.  Patient was admitted to [facility] long hospital and transferred to [facility] for dialysis apparently he had a fall about a month ago and hit his right amputation stump had increasing pain and presented to the emergency room Has been empirically started on cefepime and vancomycin.  States he does not have any pain in the stump this morning.  Dressing was removed examination of the stump demonstrates no cellulitis nontender to palpation he does have some lateral sided resolving skin breakdown.  There is no drainage no foul odor    Patient should be wearing his medicated shrinker against his skin rather than a dressing.  I will order some of these to be delivered to his room by [facility] these should be changed daily may cleanse the stump with antibacterial soap and water okay to shower.  Should follow-up in 1 week at our office after discharge

## 2019-12-03 NOTE — Discharge Instructions (Signed)

## 2019-12-04 ENCOUNTER — Encounter (HOSPITAL_COMMUNITY): Payer: Self-pay

## 2019-12-04 ENCOUNTER — Inpatient Hospital Stay (HOSPITAL_COMMUNITY)
Admission: EM | Admit: 2019-12-04 | Discharge: 2019-12-10 | DRG: 564 | Disposition: A | Discharge status: Home / Self Care | Payer: Self-pay | Attending: Internal Medicine | Admitting: Internal Medicine

## 2019-12-04 DIAGNOSIS — R7881 Bacteremia: Secondary | ICD-10-CM

## 2019-12-04 DIAGNOSIS — T8743 Infection of amputation stump, right lower extremity: Principal | ICD-10-CM | POA: Diagnosis present

## 2019-12-04 DIAGNOSIS — D631 Anemia in chronic kidney disease: Secondary | ICD-10-CM | POA: Diagnosis present

## 2019-12-04 DIAGNOSIS — Z8614 Personal history of Methicillin resistant Staphylococcus aureus infection: Secondary | ICD-10-CM

## 2019-12-04 DIAGNOSIS — Z21 Asymptomatic human immunodeficiency virus [HIV] infection status: Secondary | ICD-10-CM | POA: Diagnosis present

## 2019-12-04 DIAGNOSIS — E1122 Type 2 diabetes mellitus with diabetic chronic kidney disease: Secondary | ICD-10-CM | POA: Diagnosis present

## 2019-12-04 DIAGNOSIS — B9562 Methicillin resistant Staphylococcus aureus infection as the cause of diseases classified elsewhere: Secondary | ICD-10-CM | POA: Diagnosis present

## 2019-12-04 DIAGNOSIS — E1165 Type 2 diabetes mellitus with hyperglycemia: Secondary | ICD-10-CM | POA: Diagnosis not present

## 2019-12-04 DIAGNOSIS — Z794 Long term (current) use of insulin: Secondary | ICD-10-CM

## 2019-12-04 DIAGNOSIS — B182 Chronic viral hepatitis C: Secondary | ICD-10-CM | POA: Diagnosis present

## 2019-12-04 DIAGNOSIS — Z79899 Other long term (current) drug therapy: Secondary | ICD-10-CM

## 2019-12-04 DIAGNOSIS — E11649 Type 2 diabetes mellitus with hypoglycemia without coma: Secondary | ICD-10-CM | POA: Diagnosis not present

## 2019-12-04 DIAGNOSIS — B20 Human immunodeficiency virus [HIV] disease: Secondary | ICD-10-CM | POA: Diagnosis present

## 2019-12-04 DIAGNOSIS — N186 End stage renal disease: Secondary | ICD-10-CM

## 2019-12-04 DIAGNOSIS — L03115 Cellulitis of right lower limb: Secondary | ICD-10-CM | POA: Diagnosis present

## 2019-12-04 DIAGNOSIS — Z833 Family history of diabetes mellitus: Secondary | ICD-10-CM

## 2019-12-04 DIAGNOSIS — Z89511 Acquired absence of right leg below knee: Secondary | ICD-10-CM

## 2019-12-04 DIAGNOSIS — E1140 Type 2 diabetes mellitus with diabetic neuropathy, unspecified: Secondary | ICD-10-CM | POA: Diagnosis present

## 2019-12-04 DIAGNOSIS — I9589 Other hypotension: Secondary | ICD-10-CM | POA: Diagnosis present

## 2019-12-04 DIAGNOSIS — Z89512 Acquired absence of left leg below knee: Secondary | ICD-10-CM

## 2019-12-04 DIAGNOSIS — Y835 Amputation of limb(s) as the cause of abnormal reaction of the patient, or of later complication, without mention of misadventure at the time of the procedure: Secondary | ICD-10-CM | POA: Diagnosis present

## 2019-12-04 DIAGNOSIS — N2581 Secondary hyperparathyroidism of renal origin: Secondary | ICD-10-CM | POA: Diagnosis present

## 2019-12-04 DIAGNOSIS — I120 Hypertensive chronic kidney disease with stage 5 chronic kidney disease or end stage renal disease: Secondary | ICD-10-CM | POA: Diagnosis present

## 2019-12-04 DIAGNOSIS — Z992 Dependence on renal dialysis: Secondary | ICD-10-CM

## 2019-12-04 DIAGNOSIS — Z20822 Contact with and (suspected) exposure to covid-19: Secondary | ICD-10-CM | POA: Diagnosis present

## 2019-12-04 DIAGNOSIS — Z539 Procedure and treatment not carried out, unspecified reason: Secondary | ICD-10-CM | POA: Diagnosis not present

## 2019-12-04 DIAGNOSIS — T8740 Infection of amputation stump, unspecified extremity: Secondary | ICD-10-CM | POA: Diagnosis present

## 2019-12-04 LAB — BLOOD CULTURE ID PANEL (REFLEXED)

## 2019-12-04 LAB — BASIC METABOLIC PANEL
Anion gap: 15 (ref 5–15)
BUN: 27 mg/dL — ABNORMAL HIGH (ref 6–20)
CO2: 26 mmol/L (ref 22–32)
Calcium: 9.6 mg/dL (ref 8.9–10.3)
Chloride: 93 mmol/L — ABNORMAL LOW (ref 98–111)
Creatinine, Ser: 6.96 mg/dL — ABNORMAL HIGH (ref 0.61–1.24)
GFR calc Af Amer: 10 mL/min — ABNORMAL LOW (ref >60)
GFR calc non Af Amer: 8 mL/min — ABNORMAL LOW (ref >60)
Glucose, Bld: 189 mg/dL — ABNORMAL HIGH (ref 70–99)
Potassium: 3.7 mmol/L (ref 3.5–5.1)
Sodium: 134 mmol/L — ABNORMAL LOW (ref 135–145)

## 2019-12-04 LAB — CBC
HCT: 33.9 % — ABNORMAL LOW (ref 39.0–52.0)
Hemoglobin: 10.9 g/dL — ABNORMAL LOW (ref 13.0–17.0)
MCH: 33 pg (ref 26.0–34.0)
MCHC: 32.2 g/dL (ref 30.0–36.0)
MCV: 102.7 fL — ABNORMAL HIGH (ref 80.0–100.0)
Platelets: 208 10*3/uL (ref 150–400)
RBC: 3.3 MIL/uL — ABNORMAL LOW (ref 4.22–5.81)
RDW: 13 % (ref 11.5–15.5)
WBC: 10.3 10*3/uL (ref 4.0–10.5)
nRBC: 0 % (ref 0.0–0.2)

## 2019-12-04 LAB — SARS CORONAVIRUS 2 BY RT PCR (HOSPITAL ORDER, PERFORMED IN ~~LOC~~ HOSPITAL LAB): SARS Coronavirus 2: NEGATIVE

## 2019-12-04 LAB — CBG MONITORING, ED: Glucose-Capillary: 230 mg/dL — ABNORMAL HIGH (ref 70–99)

## 2019-12-04 LAB — LACTIC ACID, PLASMA: Lactic Acid, Venous: 0.7 mmol/L (ref 0.5–1.9)

## 2019-12-04 MED ORDER — INSULIN NPH (HUMAN) (ISOPHANE) 100 UNIT/ML ~~LOC~~ SUSP
7.0000 [IU] | Freq: Two times a day (BID) | SUBCUTANEOUS | Status: DC
  Administered 2019-12-05 (×2): 7 [IU] via SUBCUTANEOUS
  Filled 2019-12-04: qty 10

## 2019-12-04 MED ORDER — ONDANSETRON HCL 4 MG/2ML IJ SOLN: 4.0000 mg | Freq: Four times a day (QID) | INTRAMUSCULAR | Status: DC | PRN

## 2019-12-04 MED ORDER — ALTEPLASE 2 MG IJ SOLR: 2.0000 mg | Freq: Once | INTRAMUSCULAR | Status: DC | PRN

## 2019-12-04 MED ORDER — SODIUM CHLORIDE 0.9 % IV SOLN: 100.0000 mL | INTRAVENOUS | Status: DC | PRN

## 2019-12-04 MED ORDER — INSULIN ASPART 100 UNIT/ML ~~LOC~~ SOLN: 0.0000 [IU] | Freq: Three times a day (TID) | SUBCUTANEOUS | Status: DC

## 2019-12-04 MED ORDER — HEPARIN SODIUM (PORCINE) 1000 UNIT/ML DIALYSIS: 2000.0000 [IU] | Freq: Once | INTRAMUSCULAR | Status: AC

## 2019-12-04 MED ORDER — VANCOMYCIN HCL 500 MG/100ML IV SOLN
500.0000 mg | INTRAVENOUS | Status: DC
  Administered 2019-12-10: 500 mg via INTRAVENOUS
  Filled 2019-12-04 (×3): qty 100

## 2019-12-04 MED ORDER — ACETAMINOPHEN 325 MG PO TABS
650.0000 mg | ORAL_TABLET | Freq: Four times a day (QID) | ORAL | Status: DC | PRN
  Administered 2019-12-06 – 2019-12-09 (×3): 650 mg via ORAL
  Filled 2019-12-04 (×2): qty 2

## 2019-12-04 MED ORDER — ONDANSETRON HCL 4 MG PO TABS: 4.0000 mg | ORAL_TABLET | Freq: Four times a day (QID) | ORAL | Status: DC | PRN

## 2019-12-04 MED ORDER — ACETAMINOPHEN 650 MG RE SUPP: 650.0000 mg | Freq: Four times a day (QID) | RECTAL | Status: DC | PRN

## 2019-12-04 MED ORDER — PENTAFLUOROPROP-TETRAFLUOROETH EX AERO: 1.0000 "application " | INHALATION_SPRAY | CUTANEOUS | Status: DC | PRN

## 2019-12-04 MED ORDER — HEPARIN SODIUM (PORCINE) 1000 UNIT/ML DIALYSIS: 1000.0000 [IU] | INTRAMUSCULAR | Status: DC | PRN

## 2019-12-04 MED ORDER — LIDOCAINE HCL (PF) 1 % IJ SOLN: 5.0000 mL | INTRAMUSCULAR | Status: DC | PRN

## 2019-12-04 MED ORDER — HEPARIN SODIUM (PORCINE) 1000 UNIT/ML DIALYSIS: 2000.0000 [IU] | INTRAMUSCULAR | Status: DC | PRN

## 2019-12-04 MED ORDER — LIDOCAINE-PRILOCAINE 2.5-2.5 % EX CREA: 1.0000 "application " | TOPICAL_CREAM | CUTANEOUS | Status: DC | PRN

## 2019-12-04 MED ORDER — BICTEGRAVIR-EMTRICITAB-TENOFOV 50-200-25 MG PO TABS
1.0000 | ORAL_TABLET | Freq: Every day | ORAL | Status: DC
  Administered 2019-12-05 – 2019-12-10 (×6): 1 via ORAL
  Filled 2019-12-04 (×6): qty 1

## 2019-12-04 MED ORDER — INSULIN ASPART 100 UNIT/ML ~~LOC~~ SOLN
0.0000 [IU] | Freq: Three times a day (TID) | SUBCUTANEOUS | Status: DC
  Administered 2019-12-05 (×2): 2 [IU] via SUBCUTANEOUS
  Administered 2019-12-06: 3 [IU] via SUBCUTANEOUS
  Administered 2019-12-07: 1 [IU] via SUBCUTANEOUS
  Administered 2019-12-07: 2 [IU] via SUBCUTANEOUS
  Administered 2019-12-07: 1 [IU] via SUBCUTANEOUS
  Administered 2019-12-09: 2 [IU] via SUBCUTANEOUS
  Administered 2019-12-09: 1 [IU] via SUBCUTANEOUS

## 2019-12-04 MED ORDER — HEPARIN SODIUM (PORCINE) 5000 UNIT/ML IJ SOLN
5000.0000 [IU] | Freq: Three times a day (TID) | INTRAMUSCULAR | Status: DC
  Administered 2019-12-05 – 2019-12-10 (×10): 5000 [IU] via SUBCUTANEOUS
  Filled 2019-12-04 (×10): qty 1

## 2019-12-04 NOTE — ED Notes (Signed)
Attempted to call report x 1  

## 2019-12-04 NOTE — ED Provider Notes (Signed)
[facility] EMERGENCY DEPARTMENT Provider Note   CSN: [id number] Arrival date & time: 12/04/19  1404     History Chief Complaint  Patient presents with  . no complaint    Alvin Daniels is a 49 y.o. male.  Pt presents to the ED today with a + blood culture.  Pt was admitted from 7/22-23 for an infection in his right BKA stump.  Pt had an MRI while here which showed no osteomyelitis or septic arthritis.  The pt had vancomycin while in the hospital.  The hospitalist received a call from microbiology which showed MRSA growing in 1 of his blood culture bottles.  She spoke with Dr. Tommy Medal who recommended readmission for IV vancomycin with the possibility of getting his HD catheter out.  The hospitalist was finally able to get ahold of a family member of patient and told him to come back.  Pt denies any fevers.  He feels ok.  He gets dialysis MWF and received it on the 23rd while he was here in the hospital. Pt speaks [place] only.  Information is obtained via an interpreter.         Past Medical History:  Diagnosis Date  . AIDS ([place]) 11/22/2014  . Back pain 06/09/2019  . BKA stump complication ([place]) 09/20/2583  . Chronic diarrhea   . Chronic hepatitis C without hepatic coma ([place]) 11/22/2014  . 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 Diabetic neuropathy ([place])   . ESRD (end stage renal disease) on dialysis [facility])    "TTS; don't remember street name" (05/03/2014)  . Hepatitis C   . HIV INFECTION 06/27/2010   Qualifier: Diagnosis of  By: Nickola Major CMA ( [place]), Geni Bers    . Hypotension 06/02/2012  . Metabolic bone disease 06/19/7822  . MRSA infection   . Normocytic anemia 06/17/2012  . Pancreatitis   . Pressure ulcer of BKA stump, stage 2 ([place]) 11/22/2014  . Renal disorder   . Severe protein-calorie malnutrition ([place]) 06/17/2012  . Uncontrolled diabetes mellitus with complications ([place]) 2/35/3614   Annotation: uncontrolled Qualifier: Diagnosis of  By:  Nickola Major CMA Deborra Medina), Jacqueline      Patient Active Problem List   Diagnosis Date Noted  . Amputation stump infection ([place]) 12/02/2019  . Scapular fracture 09/28/2019  . COVID-19 virus infection 09/28/2019  . Chest pain 09/27/2019  . Hx MRSA infection 09/21/2019  . BKA stump complication ([place]) 43/15/4008  . Back pain 06/09/2019  . Abscess of foot   . Abscess of ankle 08/29/2018  . Charcot foot due to diabetes mellitus ([place]) 07/23/2018  . MRSA bacteremia   . Diabetic foot ulcer ([place]) 11/19/2017  . Femur fracture, left ([place]) 08/16/2016  . HIV (human immunodeficiency virus infection) ([place]) 08/16/2016  . GERD (gastroesophageal reflux disease) 05/24/2016  . Burn 04/10/2016  . Pressure ulcer of BKA stump, stage 2 ([place]) 11/22/2014  . Chronic hepatitis C without hepatic coma ([place]) 11/22/2014  . S/P bilateral BKA (below knee amputation) ([place]) 06/17/2014  . HIV disease ([place])   . Poor dentition 10/25/2013  . ESRD on dialysis ([place]) 10/25/2013  . Vision changes 06/21/2013  . Orthostatic headache 04/15/2013  . Anemia of chronic renal failure 06/19/2012  . Metabolic bone disease 67/61/9509  . Chronic diarrhea 06/17/2012  . Severe protein-calorie malnutrition ([place]) 06/17/2012  . Normocytic anemia 06/17/2012  . Cellulitis of right lower extremity 10/19/2010  . Polyneuropathy in diabetes(357.2) 06/27/2010  . Diabetes mellitus with ESRD (end-stage renal disease) ([place])  01/03/2010    Past Surgical History:  Procedure Laterality Date  . AMPUTATION Left 04/20/2014   Procedure: 3rd toe amputation, 4th Toe Amputation,  5th Toe Amputation;  Surgeon: Newt Minion, MD;  Location: [facility];  Service: Orthopedics;  Laterality: Left;  . AMPUTATION Left 05/02/2014   Procedure: Midfoot Amputation;  Surgeon: Newt Minion, MD;  Location: [facility];  Service: Orthopedics;  Laterality: Left;  . AMPUTATION Left 06/17/2014   Procedure: AMPUTATION BELOW KNEE;  Surgeon: Newt Minion, MD;  Location: [facility];  Service:  Orthopedics;  Laterality: Left;  . AMPUTATION Right 09/04/2018   Procedure: RIGHT BELOW KNEE AMPUTATION;  Surgeon: Newt Minion, MD;  Location: [facility];  Service: Orthopedics;  Laterality: Right;  RIGHT BELOW KNEE AMPUTATION  . AV FISTULA PLACEMENT Left   . AV FISTULA PLACEMENT Left 05/10/2016   Procedure: Creation Left Arm Brachiocephalic Arteriovenous Fistula and Ligation of Radiocephalic Fistula;  Surgeon: Angelia Mould, MD;  Location: [facility];  Service: Vascular;  Laterality: Left;  . FEMUR IM NAIL Left 08/17/2016   Procedure: INTRAMEDULLARY (IM) RETROGRADE FEMORAL NAILING;  Surgeon: Marybelle Killings, MD;  Location: [facility];  Service: Orthopedics;  Laterality: Left;  . FOOT AMPUTATION THROUGH ANKLE Left 12/'21/2015   midfoot  . IR GENERIC HISTORICAL Left 05/01/2016   IR THROMBECTOMY AV FISTULA W/THROMBOLYSIS/PTA INC/SHUNT/IMG LEFT 05/01/2016 [name], MD [facility]  . IR GENERIC HISTORICAL  05/01/2016   IR US GUIDE VASC ACCESS LEFT 05/01/2016 [name], MD [facility]  . IR GENERIC HISTORICAL  05/07/2016   IR FLUORO GUIDE CV LINE RIGHT 05/07/2016 Corrie Mckusick, DO [facility]  . IR GENERIC HISTORICAL  05/07/2016   IR US GUIDE VASC ACCESS RIGHT 05/07/2016 Corrie Mckusick, DO [facility]  . IR GENERIC HISTORICAL  05/22/2016   IR US GUIDE VASC ACCESS RIGHT 05/22/2016 Greggory Keen, MD [facility]  . IR GENERIC HISTORICAL  05/22/2016   IR FLUORO GUIDE CV LINE RIGHT 05/22/2016 Greggory Keen, MD [facility]  . IR REMOVAL TUN CV CATH W/O FL  08/21/2016  . PERIPHERAL VASCULAR CATHETERIZATION Left 05/09/2016   Procedure: A/V Fistulagram;  Surgeon: Angelia Mould, MD;  Location: [facility] CV LAB;  Service: Cardiovascular;  Laterality: Left;  arm  . TEE WITHOUT CARDIOVERSION N/A 06/22/2018   Procedure: TRANSESOPHAGEAL ECHOCARDIOGRAM (TEE);  Surgeon: Jerline Pain, MD;  Location: [facility] ENDOSCOPY;  Service: Cardiovascular;  Laterality: N/A;       Family History  Problem  Relation Age of Onset  . Diabetes Mother   . Diabetes Father     Social History   Tobacco Use  . Smoking status: Never Smoker  . Smokeless tobacco: Never Used  Vaping Use  . Vaping Use: Never used  Substance Use Topics  . Alcohol use: No  . Drug use: No    Home Medications Prior to Admission medications   Medication Sig Start Date End Date Taking? Authorizing Provider  bictegravir-emtricitabine-tenofovir AF (BIKTARVY) 50-200-25 MG TABS tablet Take 1 tablet by mouth daily. 06/09/19  Yes Tommy Medal, Lavell Islam, MD  cephALEXin (KEFLEX) 500 MG capsule Take 1 capsule (500 mg total) by mouth 2 (two) times daily for 7 days. 12/03/19 12/10/19 Yes Pahwani, Einar Grad, MD  insulin NPH-regular Human (HUMULIN 70/30) (70-30) 100 UNIT/ML injection Inject 15 Units into the skin 2 (two) times daily before a meal.    Yes [provider]  Blood Glucose Monitoring Suppl (TRUE METRIX METER) DEVI 1 each by Does not apply  route 3 (three) times daily. 09/15/18   [name], MD  glucose blood (TRUE METRIX BLOOD GLUCOSE TEST) test strip 1 each by Other route 3 (three) times daily. 09/15/18   [name], MD  Insulin Syringe-Needle U-100 (BD INSULIN SYRINGE ULTRAFINE) 31G X 15/64" 0.5 ML MISC 1 each by Does not apply route 2 (two) times daily. Please dispense needles as well. 09/28/18   [name], MD  midodrine (PROAMATINE) 10 MG tablet Take 1 tablet (10 mg total) by mouth Every Tuesday,Thursday,and Saturday with dialysis. Patient not taking: Reported on 12/04/2019 09/08/18   Lavina Hamman, MD  TRUEplus Lancets 28G MISC 1 each by Does not apply route 3 (three) times daily. 09/15/18   [name], MD    Allergies    Patient has no known allergies.  Review of Systems   Review of Systems  All other systems reviewed and are negative.   Physical Exam Updated Vital Signs BP 122/72 (BP Location: Right Arm)   Pulse 88   Temp 98.3 F (36.8 C) (Oral)   Resp 16   SpO2 99%   Physical Exam Vitals  and nursing note reviewed.  Constitutional:      Appearance: Normal appearance.  HENT:     Head: Normocephalic and atraumatic.     Right Ear: External ear normal.     Left Ear: External ear normal.     Nose: Nose normal.     Mouth/Throat:     Mouth: Mucous membranes are moist.     Pharynx: Oropharynx is clear.  Eyes:     Extraocular Movements: Extraocular movements intact.     Conjunctiva/sclera: Conjunctivae normal.     Pupils: Pupils are equal, round, and reactive to light.  Cardiovascular:     Rate and Rhythm: Normal rate and regular rhythm.     Pulses: Normal pulses.     Heart sounds: Normal heart sounds.  Pulmonary:     Effort: Pulmonary effort is normal.     Breath sounds: Normal breath sounds.  Abdominal:     General: Abdomen is flat. Bowel sounds are normal.     Palpations: Abdomen is soft.  Musculoskeletal:     Cervical back: Normal range of motion and neck supple.     Comments: Bilateral bka.  Right with some mild redness.  Skin:    General: Skin is warm.     Capillary Refill: Capillary refill takes less than 2 seconds.  Neurological:     Mental Status: He is alert.     ED Results / Procedures / Treatments   Labs (all labs ordered are listed, but only abnormal results are displayed) Labs Reviewed  CBC - Abnormal; Notable for the following components:      Result Value   RBC 3.30 (*)    Hemoglobin 10.9 (*)    HCT 33.9 (*)    MCV 102.7 (*)    All other components within normal limits  BASIC METABOLIC PANEL - Abnormal; Notable for the following components:   Sodium 134 (*)    Chloride 93 (*)    Glucose, Bld 189 (*)    BUN 27 (*)    Creatinine, Ser 6.96 (*)    GFR calc non Af Amer 8 (*)    GFR calc Af Amer 10 (*)    All other components within normal limits  CULTURE, BLOOD (ROUTINE X 2)  CULTURE, BLOOD (ROUTINE X 2)  LACTIC ACID, PLASMA  LACTIC ACID, PLASMA    EKG None  Radiology  No results found.  Procedures Procedures (including critical  care time)  Medications Ordered in ED Medications  vancomycin (VANCOREADY) IVPB 500 mg/100 mL (has no administration in time range)    ED Course  I have reviewed the triage vital signs and the nursing notes.  Pertinent labs & imaging results that were available during my care of the patient were reviewed by me and considered in my medical decision making (see chart for details).    MDM Rules/Calculators/A&P                          Blood cultures repeated.  Pt started back on Vancomycin per pharmacy consult.  Pt d/w Dr. Alcario Drought (triad) for admission.   Final Clinical Impression(s) / ED Diagnoses Final diagnoses:  Bacteremia  ESRD on hemodialysis [facility])    Rx / DC Orders ED Discharge Orders    None       Isla Pence, MD 12/04/19 2004

## 2019-12-04 NOTE — Progress Notes (Signed)
Pharmacy Antibiotic Note  Alvin Daniels is a 49 y.o. male admitted on 12/04/2019 with MRSA bacteremia.  Pharmacy has been consulted for vancomycin dosing.  Discharged 7/23 (cellulitis admission), called to come back d/t BCx resulting positive from 7/22.  Was on vancomycin with last dose of 500 mg IV on 7/23 after iHD.  ESRD - HD MWF   Plan: Continue vancomycin 500 mg IV q HD (target vancomycin level pre-HD 15-25 mcg/mL) Monitor HD schedule, repeat Cx and ID plans Vancomycin random as needed    Temp (24hrs), Avg:98.3 F (36.8 C), Min:98.3 F (36.8 C), Max:98.3 F (36.8 C)  Recent Labs  Lab 12/02/19 0343 12/02/19 0344 12/03/19 0448 12/04/19 1521  WBC 10.2  --  9.6 10.3  CREATININE 6.26*  --  9.06* 6.96*  LATICACIDVEN  --  0.8  --   --     Estimated Creatinine Clearance: 9.1 mL/min (A) (by C-G formula based on SCr of 6.96 mg/dL (H)).    No Known Allergies  Bertis Ruddy, PharmD Clinical Pharmacist ED Pharmacist Phone # [phone] 12/04/2019 7:10 PM

## 2019-12-04 NOTE — H&P (Signed)
History and Physical    Alvin Daniels FGH:[id number] DOB: Oct 19, 1970 DOA: 12/04/2019  PCP: Patient, No Pcp Per  Patient coming from: Home  I have personally briefly reviewed patient's old medical records in [place]  Chief Complaint: No complaint  HPI: Alvin Daniels is a 49 y.o. male with medical history significant of DM2, HIV on HAART, ESRD on MWF dialysis (last on 23rd during hospital stay), BLE amputations.  Pt was just admitted from 7/22-7/23 with cellulitis of his R BKA stump.  Pt was put on vancomycin during admission.  Cellulitis was significantly improved, Dr. Sharol Given felt it didn't need surgery, and pt discharged on 7/23 but on Keflex as outpt.  However, since discharge yesterday, his BCx came back today 1 out of 2 bottles growing MRSA!  Pt was therefore contacted and told to come back to ED so we could get him back on vancomycin.  Pt feels fine and has no specific complaints, is actually not quite clear on why he is here.   ED Course: WBC 10.3k, no other SIRS.   Review of Systems: As per HPI, otherwise all review of systems negative.  Past Medical History:  Diagnosis Date  . AIDS ([place]) 11/22/2014  . Back pain 06/09/2019  . BKA stump complication ([place]) 6/78/9381  . Chronic diarrhea   . Chronic hepatitis C without hepatic coma ([place]) 11/22/2014  . 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 Diabetic neuropathy ([place])   . ESRD (end stage renal disease) on dialysis [facility])    "TTS; don't remember street name" (05/03/2014)  . Hepatitis C   . HIV INFECTION 06/27/2010   Qualifier: Diagnosis of  By: Nickola Major CMA ( [place]), Geni Bers    . Hypotension 06/02/2012  . Metabolic bone disease 0/05/7508  . MRSA infection   . Normocytic anemia 06/17/2012  . Pancreatitis   . Pressure ulcer of BKA stump, stage 2 ([place]) 11/22/2014  . Renal disorder   . Severe protein-calorie malnutrition ([place]) 06/17/2012  . Uncontrolled diabetes mellitus with complications ([place])  2/58/5277   Annotation: uncontrolled Qualifier: Diagnosis of  By: Nickola Major CMA Deborra Medina), Geni Bers      Past Surgical History:  Procedure Laterality Date  . AMPUTATION Left 04/20/2014   Procedure: 3rd toe amputation, 4th Toe Amputation,  5th Toe Amputation;  Surgeon: Newt Minion, MD;  Location: [place];  Service: Orthopedics;  Laterality: Left;  . AMPUTATION Left 05/02/2014   Procedure: Midfoot Amputation;  Surgeon: Newt Minion, MD;  Location: [place];  Service: Orthopedics;  Laterality: Left;  . AMPUTATION Left 06/17/2014   Procedure: AMPUTATION BELOW KNEE;  Surgeon: Newt Minion, MD;  Location: [place];  Service: Orthopedics;  Laterality: Left;  . AMPUTATION Right 09/04/2018   Procedure: RIGHT BELOW KNEE AMPUTATION;  Surgeon: Newt Minion, MD;  Location: [place];  Service: Orthopedics;  Laterality: Right;  RIGHT BELOW KNEE AMPUTATION  . AV FISTULA PLACEMENT Left   . AV FISTULA PLACEMENT Left 05/10/2016   Procedure: Creation Left Arm Brachiocephalic Arteriovenous Fistula and Ligation of Radiocephalic Fistula;  Surgeon: Angelia Mould, MD;  Location: [place];  Service: Vascular;  Laterality: Left;  . FEMUR IM NAIL Left 08/17/2016   Procedure: INTRAMEDULLARY (IM) RETROGRADE FEMORAL NAILING;  Surgeon: Marybelle Killings, MD;  Location: [place];  Service: Orthopedics;  Laterality: Left;  . FOOT AMPUTATION THROUGH ANKLE Left 12/'21/2015   midfoot  . IR GENERIC HISTORICAL Left 05/01/2016   IR THROMBECTOMY AV FISTULA W/THROMBOLYSIS/PTA INC/SHUNT/IMG LEFT  05/01/2016 [name], MD [facility]  . IR GENERIC HISTORICAL  05/01/2016   IR US GUIDE VASC ACCESS LEFT 05/01/2016 [name], MD [facility]  . IR GENERIC HISTORICAL  05/07/2016   IR FLUORO GUIDE CV LINE RIGHT 05/07/2016 Corrie Mckusick, DO [facility]  . IR GENERIC HISTORICAL  05/07/2016   IR US GUIDE VASC ACCESS RIGHT 05/07/2016 Corrie Mckusick, DO [facility]  . IR GENERIC HISTORICAL  05/22/2016   IR US GUIDE VASC ACCESS RIGHT 05/22/2016  Greggory Keen, MD [facility]  . IR GENERIC HISTORICAL  05/22/2016   IR FLUORO GUIDE CV LINE RIGHT 05/22/2016 Greggory Keen, MD [facility]  . IR REMOVAL TUN CV CATH W/O FL  08/21/2016  . PERIPHERAL VASCULAR CATHETERIZATION Left 05/09/2016   Procedure: A/V Fistulagram;  Surgeon: Angelia Mould, MD;  Location: [place] CV LAB;  Service: Cardiovascular;  Laterality: Left;  arm  . TEE WITHOUT CARDIOVERSION N/A 06/22/2018   Procedure: TRANSESOPHAGEAL ECHOCARDIOGRAM (TEE);  Surgeon: Jerline Pain, MD;  Location: [facility] ENDOSCOPY;  Service: Cardiovascular;  Laterality: N/A;     reports that he has never smoked. He has never used smokeless tobacco. He reports that he does not drink alcohol and does not use drugs.  No Known Allergies  Family History  Problem Relation Age of Onset  . Diabetes Mother   . Diabetes Father      Prior to Admission medications   Medication Sig Start Date End Date Taking? Authorizing Provider  bictegravir-emtricitabine-tenofovir AF (BIKTARVY) 50-200-25 MG TABS tablet Take 1 tablet by mouth daily. 06/09/19  Yes Tommy Medal, Lavell Islam, MD  cephALEXin (KEFLEX) 500 MG capsule Take 1 capsule (500 mg total) by mouth 2 (two) times daily for 7 days. 12/03/19 12/10/19 Yes Pahwani, Einar Grad, MD  insulin NPH-regular Human (HUMULIN 70/30) (70-30) 100 UNIT/ML injection Inject 15 Units into the skin 2 (two) times daily before a meal.    Yes [provider]  Blood Glucose Monitoring Suppl (TRUE METRIX METER) DEVI 1 each by Does not apply route 3 (three) times daily. 09/15/18   [name], MD  glucose blood (TRUE METRIX BLOOD GLUCOSE TEST) test strip 1 each by Other route 3 (three) times daily. 09/15/18   [name], MD  Insulin Syringe-Needle U-100 (BD INSULIN SYRINGE ULTRAFINE) 31G X 15/64" 0.5 ML MISC 1 each by Does not apply route 2 (two) times daily. Please dispense needles as well. 09/28/18   [name], MD  TRUEplus Lancets 28G MISC 1 each by Does not apply  route 3 (three) times daily. 09/15/18   [name], MD    Physical Exam: Vitals:   12/04/19 1945 12/04/19 2000 12/04/19 2015 12/04/19 2030  BP: (!) 144/85 (!) 136/79 115/70 104/65  Pulse: 100 94 89 94  Resp: 16 (!) 24 14 13   Temp:      TempSrc:      SpO2: 99% 99% 97% 97%    Constitutional: NAD, calm, comfortable Eyes: PERRL, lids and conjunctivae normal ENMT: Mucous membranes are moist. Posterior pharynx clear of any exudate or lesions.Normal dentition.  Neck: normal, supple, no masses, no thyromegaly Respiratory: clear to auscultation bilaterally, no wheezing, no crackles. Normal respiratory effort. No accessory muscle use.  Cardiovascular: Regular rate and rhythm, no murmurs / rubs / gallops. No extremity edema. 2+ pedal pulses. No carotid bruits.  Abdomen: no tenderness, no masses palpated. No hepatosplenomegaly. Bowel sounds positive.  Musculoskeletal: no clubbing / cyanosis. No joint deformity upper and lower extremities. Good ROM, no contractures.  Normal muscle tone.  Skin: no rashes, lesions, ulcers. No induration Neurologic: CN 2-12 grossly intact. Sensation intact, DTR normal. Strength 5/5 in all 4.  Psychiatric: Normal judgment and insight. Alert and oriented x 3. Normal mood.    Labs on Admission: I have personally reviewed following labs and imaging studies  CBC: Recent Labs  Lab 12/02/19 0343 12/03/19 0448 12/04/19 1521  WBC 10.2 9.6 10.3  NEUTROABS 7.2  --   --   HGB 11.9* 11.2* 10.9*  HCT 35.5* 33.4* 33.9*  MCV 102.9* 101.8* 102.7*  PLT 173 172 093   Basic Metabolic Panel: Recent Labs  Lab 12/02/19 0343 12/03/19 0448 12/04/19 1521  NA 139 136 134*  K 3.8 4.0 3.7  CL 94* 93* 93*  CO2 32 28 26  GLUCOSE 192* 179* 189*  BUN 17 24* 27*  CREATININE 6.26* 9.06* 6.96*  CALCIUM 9.0 8.9 9.6   GFR: Estimated Creatinine Clearance: 9.1 mL/min (A) (by C-G formula based on SCr of 6.96 mg/dL (H)). Liver Function Tests: No results for input(s): AST, ALT,  ALKPHOS, BILITOT, PROT, ALBUMIN in the last 168 hours. No results for input(s): LIPASE, AMYLASE in the last 168 hours. No results for input(s): AMMONIA in the last 168 hours. Coagulation Profile: No results for input(s): INR, PROTIME in the last 168 hours. Cardiac Enzymes: No results for input(s): CKTOTAL, CKMB, CKMBINDEX, TROPONINI in the last 168 hours. BNP (last 3 results) No results for input(s): PROBNP in the last 8760 hours. HbA1C: Recent Labs    12/02/19 0343  HGBA1C 6.9*   CBG: Recent Labs  Lab 12/02/19 1743 12/02/19 1810 12/02/19 2254 12/03/19 0635 12/03/19 1156  GLUCAP 253* 225* 170* 172* 114*   Lipid Profile: No results for input(s): CHOL, HDL, LDLCALC, TRIG, CHOLHDL, LDLDIRECT in the last 72 hours. Thyroid Function Tests: No results for input(s): TSH, T4TOTAL, FREET4, T3FREE, THYROIDAB in the last 72 hours. Anemia Panel: No results for input(s): VITAMINB12, FOLATE, FERRITIN, TIBC, IRON, RETICCTPCT in the last 72 hours. Urine analysis:    Component Value Date/Time   COLORURINE YELLOW 07/04/2010 1840   APPEARANCEUR TURBID (A) 07/04/2010 1840   LABSPEC 1.013 07/04/2010 1840   PHURINE 7.0 07/04/2010 1840   GLUCOSEU NEG mg/dL 07/04/2010 1840   HGBUR TRACE (A) 03/13/2010 1824   BILIRUBINUR NEG 07/04/2010 1840   KETONESUR NEG mg/dL 07/04/2010 1840   PROTEINUR > 300 mg/dL (A) 07/04/2010 1840   UROBILINOGEN 0.2 07/04/2010 1840   NITRITE NEG 07/04/2010 1840   LEUKOCYTESUR LARGE (A) 07/04/2010 1840    Radiological Exams on Admission: No results found.  EKG: Independently reviewed.  Assessment/Plan Principal Problem:   MRSA bacteremia Active Problems:   Diabetes mellitus with ESRD (end-stage renal disease) ([place])   ESRD on dialysis ([place])   HIV disease ([place])   Amputation stump infection ([place])    1. MRSA bacteremia - 1. Presumably from the amputation stump infection (which is now looking much better) thanks to 2 days of vanc in the hospital. 2. Putting pt  back on Vanc, stopping keflex 3. Admitting overnight so we can set up outpt Vanc (presumably with dialysis). 2. ESRD - 1. Call nephro in AM if pt not discharged tomorrow for routine IP dialysis MWF 2. Doesn't take midodrine anymore apparently even with dialysis. 1. Will defer need for midodrine or not with dialysis to nephrology. 3. DM2 - 1. Takes 15u BID of 70/30 2. Will put on 7u BID NPH 3. And sensitive SSI AC 4. HIV - 1. Follows with ID  clinic 2. Cont Biktarvay 3. Seems to be well controlled: Last CD4 count was >300 and undetectable viral load in Jan, has been undetectable for quite some time.  DVT prophylaxis: Heparin [place] Code Status: Full Family Communication: No family in room Disposition Plan: Home after outpt vanc set up, presumably with dialysis (possibly tomorrow?) Consults called: None Admission status: Place in obs   Odetta Forness, [place] [facility]  How to contact the [facility] Attending or Consulting provider [place] or covering provider during after hours [place], for this patient?  1. Check the care team in [facility] and look for a) attending/consulting TR[facility] provider listed and b) the [facility] team listed 2. Log into [email or web address]  Amion Physician Scheduling and messaging for groups and whole hospitals  On call and physician scheduling software for group practices, residents, hospitalists and other medical providers for call, clinic, rotation and shift schedules. OnCall [employer] is a hospital-wide system for scheduling doctors and paging doctors on call. EasyPlot is for scientific plotting and data analysis.  [email or web address]  and use [facility]'s universal password to access. If you do not have the password, please contact the hospital operator.  3. Locate the [facility] provider you are looking for under [facility] and page to a number that you can be directly reached. 4. If you still have difficulty reaching the provider, please page the [facility] (Director on Call) for the  Hospitalists listed on amion for assistance.  12/04/2019, 8:49 PM

## 2019-12-04 NOTE — ED Triage Notes (Addendum)
Pt arrives to ED stating he was called and told to come to the ED. Pt does not know who called him, or what the reason for. Pt seen yesterday and discharged. Pt states that it may have had something to w/ a blood clot in his leg. Pt has bilat BKA. Pt is dialysis pt and receives dialysis MWF, received full treatment.

## 2019-12-04 NOTE — Care Plan (Addendum)
Received a call from microbiology few minutes earlier that patient is going MRSA on one of his bottles of anaerobic blood culture.  Discussed case with ID on-call Dr. Drucilla Schmidt who recommends patient to be readmitted to ED for IV vancomycin and possibly getting his HD catheter out.  Initially called patient's phone number listed in the chart [phone] but was unable to get a hold of him.  Left a voicemail.  I then called his friend listed in the chart with the name of Hermina at [phone] and was unsuccessful.  After calling several numbers, I was finally able to talk to his cousin Jerrik Housholder at [phone] and informed him about patient's positive blood culture report and strongly advised him to reach out to the patient and ask him to come to the emergency department for admission for IV antibiotics.  For ED physician, please admit under hospitalist service and inform Dr. Drucilla Schmidt about patient's admission.  Please note that this note was documented on 12/04/2019 at 8:47 AM.  For some reason, the time and date of this note is showing from yesterday and I am unable to addend that.

## 2019-12-05 DIAGNOSIS — E1129 Type 2 diabetes mellitus with other diabetic kidney complication: Secondary | ICD-10-CM

## 2019-12-05 DIAGNOSIS — T8743 Infection of amputation stump, right lower extremity: Principal | ICD-10-CM

## 2019-12-05 DIAGNOSIS — L03115 Cellulitis of right lower limb: Secondary | ICD-10-CM

## 2019-12-05 LAB — CBC
HCT: 32.6 % — ABNORMAL LOW (ref 39.0–52.0)
Hemoglobin: 10.9 g/dL — ABNORMAL LOW (ref 13.0–17.0)
MCH: 33.9 pg (ref 26.0–34.0)
MCHC: 33.4 g/dL (ref 30.0–36.0)
MCV: 101.2 fL — ABNORMAL HIGH (ref 80.0–100.0)
Platelets: 219 10*3/uL (ref 150–400)
RBC: 3.22 MIL/uL — ABNORMAL LOW (ref 4.22–5.81)
RDW: 13.1 % (ref 11.5–15.5)
WBC: 9.1 10*3/uL (ref 4.0–10.5)
nRBC: 0 % (ref 0.0–0.2)

## 2019-12-05 LAB — CULTURE, BLOOD (ROUTINE X 2): Special Requests: ADEQUATE

## 2019-12-05 LAB — BASIC METABOLIC PANEL
Anion gap: 17 — ABNORMAL HIGH (ref 5–15)
BUN: 31 mg/dL — ABNORMAL HIGH (ref 6–20)
CO2: 22 mmol/L (ref 22–32)
Calcium: 9.3 mg/dL (ref 8.9–10.3)
Chloride: 95 mmol/L — ABNORMAL LOW (ref 98–111)
Creatinine, Ser: 8.26 mg/dL — ABNORMAL HIGH (ref 0.61–1.24)
GFR calc Af Amer: 8 mL/min — ABNORMAL LOW (ref >60)
GFR calc non Af Amer: 7 mL/min — ABNORMAL LOW (ref >60)
Glucose, Bld: 245 mg/dL — ABNORMAL HIGH (ref 70–99)
Potassium: 3.8 mmol/L (ref 3.5–5.1)
Sodium: 134 mmol/L — ABNORMAL LOW (ref 135–145)

## 2019-12-05 LAB — VANCOMYCIN, RANDOM: Vancomycin Rm: 18

## 2019-12-05 LAB — GLUCOSE, CAPILLARY
Glucose-Capillary: 177 mg/dL — ABNORMAL HIGH (ref 70–99)
Glucose-Capillary: 195 mg/dL — ABNORMAL HIGH (ref 70–99)
Glucose-Capillary: 234 mg/dL — ABNORMAL HIGH (ref 70–99)
Glucose-Capillary: 271 mg/dL — ABNORMAL HIGH (ref 70–99)
Glucose-Capillary: 68 mg/dL — ABNORMAL LOW (ref 70–99)
Glucose-Capillary: 70 mg/dL (ref 70–99)

## 2019-12-05 MED ORDER — LINEZOLID 600 MG PO TABS
600.0000 mg | ORAL_TABLET | Freq: Two times a day (BID) | ORAL | Status: DC
  Administered 2019-12-05: 600 mg via ORAL
  Filled 2019-12-05 (×2): qty 1

## 2019-12-05 NOTE — Progress Notes (Addendum)
Subjective: Recently DC 7/22 -11/2319 right BKA stump cellulitis put on vancomycin, Dr. Sharol Given evaluated= did not need surgery ,discharged on p.o. Keflex, now readmitted 1 of 2 bottles growing MRSA.Marland Kitchen  Last dialysis on schedule Friday 7/23 using left upper arm AV fistula.  Denies any pain erythema pustular discharge from this AV fistula.  He does not have any recent PermCath lines.  Interpretation through phone line We are reconsult again for ESRD issues and HD.    Objective Vital signs in last 24 hours: Vitals:   12/04/19 2235 12/05/19 0235 12/05/19 0635 12/05/19 1041  BP: 113/71 113/78 112/70 110/73  Pulse: 86 80 78 77  Resp: 18 18 18 16   Temp: 98.8 F (37.1 C) 98 F (36.7 C) 98 F (36.7 C) 98.2 F (36.8 C)  TempSrc: Oral Oral Oral Oral  SpO2: 97% 98% 99% 98%  Weight: 52.9 kg      Weight change:   Physical Exam: General: Alert, well nourished chronically ill but nontoxic-appearing male Heart: RRR, no rub or murmur appreciated Lungs: CTA nonlabored Abdomen: Bowel sounds normal active, soft nontender nondistended Extremities: Right BKA stump ulceration wrapped with bloody discharge noted on dressing, left BKA stump no edema  Dialysis Access: Left upper arm AV fistula 2 aneurysms minimal thinning but no discharge or tenderness expressed, no ulcers appreciated Neurological= alert oriented, no acute focal deficits appreciated  OP HD: MWF NW 4h 50.0 kg  edw  400/ 2/2 bath LUE AVF Hep 2000  hect 35mcg    Holding  Mircera 60 last on   10/27/19  With hgb 11.3  On 12/01/19   Problem/Plan: 1. MRSA bacteremia questionable antibiotics and etiology work-up with ID and admit team 2. Cellulitis of right BKA= antibiotics per ID and admit team 3. ESRD -HD Monday Wednesday Friday schedule 4. Anemia -Hgb 10.9, follow-up ESA for scheduling, no iron with his current infection 5. Secondary hyperparathyroidism -continue binders and Hectorol 6. HTN/volume -BP stable appears euvolemic,  history of low BPs asymptomatic and (noted as an outpatient has worked for okay systolic blood pressure in the 70s on hemodialysis as prior ordered by Dr. Lorrene Reid) 7. HIV: on biktarvy. Follows with Dr. Drucilla Schmidt. 8.  DM type II per admit team  Ernest Haber, PA-C [place] [phone] 12/05/2019,12:26 PM  LOS: 0 days   Labs: Basic Metabolic Panel: Recent Labs  Lab 12/03/19 0448 12/04/19 1521 12/05/19 0042  NA 136 134* 134*  K 4.0 3.7 3.8  CL 93* 93* 95*  CO2 28 26 22   GLUCOSE 179* 189* 245*  BUN 24* 27* 31*  CREATININE 9.06* 6.96* 8.26*  CALCIUM 8.9 9.6 9.3   Liver Function Tests: No results for input(s): AST, ALT, ALKPHOS, BILITOT, PROT, ALBUMIN in the last 168 hours. No results for input(s): LIPASE, AMYLASE in the last 168 hours. No results for input(s): AMMONIA in the last 168 hours. CBC: Recent Labs  Lab 12/02/19 0343 12/02/19 0343 12/03/19 0448 12/04/19 1521 12/05/19 0042  WBC 10.2   < > 9.6 10.3 9.1  NEUTROABS 7.2  --   --   --   --   HGB 11.9*   < > 11.2* 10.9* 10.9*  HCT 35.5*   < > 33.4* 33.9* 32.6*  MCV 102.9*  --  101.8* 102.7* 101.2*  PLT 173   < > 172 208 219   < > = values in this interval not displayed.   Cardiac Enzymes: No results for input(s): CKTOTAL, CKMB, CKMBINDEX, TROPONINI in the last 168 hours.  CBG: Recent Labs  Lab 12/04/19 2159 12/05/19 0039 12/05/19 0651 12/05/19 0735 12/05/19 1114  GLUCAP 230* 234* 68* 70 177*    Studies/Results: No results found. Medications: . sodium chloride    . sodium chloride    . [START ON 12/06/2019] vancomycin     . bictegravir-emtricitabine-tenofovir AF  1 tablet Oral Daily  . heparin  2,000 Units Dialysis Once in dialysis  . heparin  5,000 Units Subcutaneous Q8H  . insulin aspart  0-9 Units Subcutaneous TID WC  . insulin NPH Human  7 Units Subcutaneous BID AC & HS

## 2019-12-05 NOTE — Progress Notes (Signed)
Pharmacy Antibiotic Note  Alvin Daniels is a 49 y.o. male admitted on 12/04/2019 with MRSA bacteremia.  Pt is on ESRD on HD - MWF. Patient has been receiving the appropriate dose and frequency of vancomycin for a dialysis patient. A vancomycin level was checked today. While waiting for this value to result, the patient was given Zyvox x1 as a backup incase the patient's vancomycin level was subtherapeutic. Incidentally, the vancomycin level returned at 18 mcg/mL (therapeutic). Zyvox was discontinued.  Plan: Continue vancomycin 500 mg IV q HD Mon/Wed/Fri  Target vancomycin level pre-HD 15-25 mcg/mL)    Weight: 52.9 kg (116 lb 10 oz)  Temp (24hrs), Avg:98.3 F (36.8 C), Min:98 F (36.7 C), Max:98.8 F (37.1 C)  Recent Labs  Lab 12/02/19 0343 12/02/19 0344 12/03/19 0448 12/04/19 1521 12/04/19 1925 12/05/19 0042 12/05/19 1237  WBC 10.2  --  9.6 10.3  --  9.1  --   CREATININE 6.26*  --  9.06* 6.96*  --  8.26*  --   LATICACIDVEN  --  0.8  --   --  0.7  --   --   VANCORANDOM  --   --   --   --   --   --  18    Estimated Creatinine Clearance: 7.7 mL/min (A) (by C-G formula based on SCr of 8.26 mg/dL (H)).    No Known Allergies  Harvel Quale 12/05/2019 1:55 PM

## 2019-12-05 NOTE — Progress Notes (Signed)
CBG - 68.  Patient is asymptomatic.  Via interpreter, it was explained that patient needed to eat or drink something with carbohydrates to bring his blood sugar within normal range.  Patient states it is not necessary and he will wait till breakfast.  Will continue to monitor patient.  Earleen Reaper RN

## 2019-12-05 NOTE — Progress Notes (Signed)
PROGRESS NOTE    Alvin Daniels  IRJ:[id number] DOB: [birth year] DOA: 12/04/2019 PCP: Patient, No Pcp Per   Brief Narrative:  Alvin Daniels is a 49 y.o. male with medical history significant of DM2, HIV on HAART, ESRD on MWF dialysis (last on 23rd during hospital stay), BLE amputations. Pt was just admitted from 7/22-7/23 with cellulitis of his R BKA stump.  Pt was put on vancomycin during admission.  Cellulitis was significantly improved, Dr. Sharol Given felt it didn't need surgery, and pt discharged on 7/23 on Keflex as outpt per ID recommendations as his cellulitis was none to minimal and nonpurulent. However, since discharge yesterday, his one of the 2 bottles of BCx came back positive with MRSA.  Patient was called to come back to the ED for further evaluation.  Patient asymptomatic.   Assessment & Plan:   Principal Problem:   MRSA bacteremia Active Problems:   Diabetes mellitus with ESRD (end-stage renal disease) ([place])   ESRD on dialysis ([place])   HIV disease ([place])   Amputation stump infection ([place])  MRSA bacteremia/right stump cellulitis: Source unclear.  Patient is symptomatic.  Will order transthoracic echo.  Antibiotics per ID.  ESRD: Nephrology on board.  Management per them.  Type 2 diabetes mellitus: Takes 15 units of Novolin and twice daily at home however here he is only on 7 twice daily along with SSI.  Had hypoglycemia this morning but hyperglycemia now.  Continue current regimen watch and manage accordingly.  HIV: Follows ID clinic.  Management per ID.  DVT prophylaxis: heparin injection 5,000 Units Start: 12/04/19 2200   Code Status: Full Code  Family Communication: None present at bedside.  Plan of care discussed with patient in length and he verbalized understanding and agreed with it.  Status is: Inpatient  Remains inpatient appropriate because:Inpatient level of care appropriate due to severity of illness   Dispo: The patient is from: Home               Anticipated d/c is to: Home              Anticipated d/c date is: 2 days              Patient currently is not medically stable to d/c.        Estimated body mass index is 22.78 kg/m as calculated from the following:   Height as of 09/27/19: 5' (1.524 m).   Weight as of this encounter: 52.9 kg.  Pressure Injury 08/21/16 (Active)  08/21/16 2020  Location: [place]  Location Orientation: Right  Staging:   Wound Description (Comments):   Present on Admission:      Nutritional status:               Consultants:   ID and nephrology  Procedures:   None  Antimicrobials:  Anti-infectives (From admission, onward)   Start     Dose/Rate Route Frequency Ordered Stop   12/06/19 1200  vancomycin (VANCOREADY) IVPB 500 mg/100 mL     Discontinue     500 mg 100 mL/hr over 60 Minutes Intravenous Every M-W-F (Hemodialysis) 12/04/19 1912     12/05/19 1315  linezolid (ZYVOX) tablet 600 mg  Status:  Discontinued        600 mg Oral Every 12 hours 12/05/19 1231 12/05/19 1336   12/05/19 1000  bictegravir-emtricitabine-tenofovir AF (BIKTARVY) 50-200-25 MG per tablet 1 tablet     Discontinue     1 tablet Oral Daily 12/04/19 2040  Subjective: Seen and examined.  Interpreter used.  He has no complaints.  Objective: Vitals:   12/04/19 2235 12/05/19 0235 12/05/19 0635 12/05/19 1041  BP: 113/71 113/78 112/70 110/73  Pulse: 86 80 78 77  Resp: 18 18 18 16   Temp: 98.8 F (37.1 C) 98 F (36.7 C) 98 F (36.7 C) 98.2 F (36.8 C)  TempSrc: Oral Oral Oral Oral  SpO2: 97% 98% 99% 98%  Weight: 52.9 kg       Intake/Output Summary (Last 24 hours) at 12/05/2019 1349 Last data filed at 12/05/2019 1000 Gross per 24 hour  Intake 240 ml  Output 0 ml  Net 240 ml   Filed Weights   12/04/19 2235  Weight: 52.9 kg    Examination:  General exam: Appears calm and comfortable  Respiratory system: Clear to auscultation. Respiratory effort normal. Cardiovascular system: S1 &  S2 heard, RRR. No JVD, murmurs, rubs, gallops or clicks. No pedal edema. Gastrointestinal system: Abdomen is nondistended, soft and nontender. No organomegaly or masses felt. Normal bowel sounds heard. Central nervous system: Alert and oriented. No focal neurological deficits. Extremities: Bilateral BKA.  2 superficial ulcers at the right stump with slight discharge. Psychiatry: Judgement and insight appear normal. Mood & affect appropriate.        Data Reviewed: I have personally reviewed following labs and imaging studies  CBC: Recent Labs  Lab 12/02/19 0343 12/03/19 0448 12/04/19 1521 12/05/19 0042  WBC 10.2 9.6 10.3 9.1  NEUTROABS 7.2  --   --   --   HGB 11.9* 11.2* 10.9* 10.9*  HCT 35.5* 33.4* 33.9* 32.6*  MCV 102.9* 101.8* 102.7* 101.2*  PLT 173 172 208 481   Basic Metabolic Panel: Recent Labs  Lab 12/02/19 0343 12/03/19 0448 12/04/19 1521 12/05/19 0042  NA 139 136 134* 134*  K 3.8 4.0 3.7 3.8  CL 94* 93* 93* 95*  CO2 32 28 26 22   GLUCOSE 192* 179* 189* 245*  BUN 17 24* 27* 31*  CREATININE 6.26* 9.06* 6.96* 8.26*  CALCIUM 9.0 8.9 9.6 9.3   GFR: Estimated Creatinine Clearance: 7.7 mL/min (A) (by C-G formula based on SCr of 8.26 mg/dL (H)). Liver Function Tests: No results for input(s): AST, ALT, ALKPHOS, BILITOT, PROT, ALBUMIN in the last 168 hours. No results for input(s): LIPASE, AMYLASE in the last 168 hours. No results for input(s): AMMONIA in the last 168 hours. Coagulation Profile: No results for input(s): INR, PROTIME in the last 168 hours. Cardiac Enzymes: No results for input(s): CKTOTAL, CKMB, CKMBINDEX, TROPONINI in the last 168 hours. BNP (last 3 results) No results for input(s): PROBNP in the last 8760 hours. HbA1C: No results for input(s): HGBA1C in the last 72 hours. CBG: Recent Labs  Lab 12/04/19 2159 12/05/19 0039 12/05/19 0651 12/05/19 0735 12/05/19 1114  GLUCAP 230* 234* 68* 70 177*   Lipid Profile: No results for input(s):  CHOL, HDL, LDLCALC, TRIG, CHOLHDL, LDLDIRECT in the last 72 hours. Thyroid Function Tests: No results for input(s): TSH, T4TOTAL, FREET4, T3FREE, THYROIDAB in the last 72 hours. Anemia Panel: No results for input(s): VITAMINB12, FOLATE, FERRITIN, TIBC, IRON, RETICCTPCT in the last 72 hours. Sepsis Labs: Recent Labs  Lab 12/02/19 0344 12/04/19 1925  LATICACIDVEN 0.8 0.7    Recent Results (from the past 240 hour(s))  Blood culture (routine x 2)     Status: None (Preliminary result)   Collection Time: 12/02/19  3:43 AM   Specimen: BLOOD RIGHT FOREARM  Result Value Ref Range Status  Specimen Description   Final    BLOOD RIGHT FOREARM Performed at [facility], [place] [address], [place], [place] [address]    Special Requests   Final    BOTTLES DRAWN AEROBIC AND ANAEROBIC Blood Culture adequate volume Performed at [facility] [address], [place], [place] [address]    Culture   Final    NO GROWTH 3 DAYS Performed at [facility], [place] [address] [place], [place] [address]    Report Status PENDING  Incomplete  Blood culture (routine x 2)     Status: Abnormal   Collection Time: 12/02/19  3:44 AM   Specimen: BLOOD LEFT HAND  Result Value Ref Range Status   Specimen Description   Final    BLOOD LEFT HAND Performed at [facility] [address] [place], [place] [address]    Special Requests   Final    BOTTLES DRAWN AEROBIC AND ANAEROBIC Blood Culture adequate volume Performed at [facility] [address] [place], [place] [address]    Culture  Setup Time   Final    GRAM POSITIVE COCCI IN CLUSTERS ANAEROBIC BOTTLE ONLY CRITICAL RESULT CALLED TO, READ BACK BY AND VERIFIED WITH: DR Doristine Bosworth Zyiah [place] [id number] AT 412 AM BY CM Performed at [facility], [place] [address], [place], [place] [address]    Culture METHICILLIN RESISTANT STAPHYLOCOCCUS AUREUS (A)  Final   Report Status 12/05/2019 FINAL  Final    Organism ID, Bacteria METHICILLIN RESISTANT STAPHYLOCOCCUS AUREUS  Final      Susceptibility   Methicillin resistant staphylococcus aureus - MIC*    CIPROFLOXACIN <=0.5 SENSITIVE Sensitive     ERYTHROMYCIN >=8 RESISTANT Resistant     GENTAMICIN <=0.5 SENSITIVE Sensitive     OXACILLIN >=4 RESISTANT Resistant     TETRACYCLINE >=16 RESISTANT Resistant     VANCOMYCIN <=0.5 SENSITIVE Sensitive     TRIMETH/SULFA <=10 SENSITIVE Sensitive     CLINDAMYCIN <=0.25 SENSITIVE Sensitive     RIFAMPIN <=0.5 SENSITIVE Sensitive     Inducible Clindamycin NEGATIVE Sensitive     * METHICILLIN RESISTANT STAPHYLOCOCCUS AUREUS  Blood Culture ID Panel (Reflexed)     Status: Abnormal   Collection Time: 12/02/19  3:44 AM  Result Value Ref Range Status   Enterococcus species NOT DETECTED NOT DETECTED Final   Listeria monocytogenes NOT DETECTED NOT DETECTED Final   Staphylococcus species DETECTED (A) NOT DETECTED Final    Comment: CRITICAL RESULT CALLED TO, READ BACK BY AND VERIFIED WITH: DR Doristine Bosworth R [id number] AT 835 AM BY CM    Staphylococcus aureus (BCID) DETECTED (A) NOT DETECTED Final    Comment: Methicillin (oxacillin)-resistant Staphylococcus aureus (MRSA). MRSA is predictably resistant to beta-lactam antibiotics (except ceftaroline). Preferred therapy is vancomycin unless clinically contraindicated. Patient requires contact precautions if  hospitalized. CRITICAL RESULT CALLED TO, READ BACK BY AND VERIFIED WITH: DR Loann Quill [id number] AT 835 BY CM    Methicillin resistance DETECTED (A) NOT DETECTED Final    Comment: CRITICAL RESULT CALLED TO, READ BACK BY AND VERIFIED WITH: DR Doristine Bosworth R [id number] AT 835 AM BY CM    Streptococcus species NOT DETECTED NOT DETECTED Final   Streptococcus agalactiae NOT DETECTED NOT DETECTED Final   Streptococcus pneumoniae NOT DETECTED NOT DETECTED Final   Streptococcus pyogenes NOT DETECTED NOT DETECTED Final   Acinetobacter baumannii NOT DETECTED NOT DETECTED Final    Enterobacteriaceae species NOT DETECTED NOT DETECTED Final   Enterobacter cloacae complex  NOT DETECTED NOT DETECTED Final   Escherichia coli NOT DETECTED NOT DETECTED Final   Klebsiella oxytoca NOT DETECTED NOT DETECTED Final   Klebsiella pneumoniae NOT DETECTED NOT DETECTED Final   Proteus species NOT DETECTED NOT DETECTED Final   Serratia marcescens NOT DETECTED NOT DETECTED Final   Haemophilus influenzae NOT DETECTED NOT DETECTED Final   Neisseria meningitidis NOT DETECTED NOT DETECTED Final   Pseudomonas aeruginosa NOT DETECTED NOT DETECTED Final   Candida albicans NOT DETECTED NOT DETECTED Final   Candida glabrata NOT DETECTED NOT DETECTED Final   Candida krusei NOT DETECTED NOT DETECTED Final   Candida parapsilosis NOT DETECTED NOT DETECTED Final   Candida tropicalis NOT DETECTED NOT DETECTED Final    Comment: Performed at [facility], [place] [address], [place], [place] [address]  SARS Coronavirus 2 by RT PCR (hospital order, performed in [facility] hospital lab) Nasopharyngeal Nasopharyngeal Swab     Status: None   Collection Time: 12/02/19  6:53 AM   Specimen: Nasopharyngeal Swab  Result Value Ref Range Status   SARS Coronavirus 2 NEGATIVE NEGATIVE Final    Comment: (NOTE) SARS-CoV-2 target nucleic acids are NOT DETECTED.  The SARS-CoV-2 RNA is generally detectable in upper and lower respiratory specimens during the acute phase of infection. The lowest concentration of SARS-CoV-2 viral copies this assay can detect is 250 copies / mL. A negative result does not preclude SARS-CoV-2 infection and should not be used as the sole basis for treatment or other patient management decisions.  A negative result may occur with improper specimen collection / handling, submission of specimen other than nasopharyngeal swab, presence of viral mutation(s) within the areas targeted by this assay, and inadequate number of viral copies (<250 copies / mL). A negative result must be combined  with clinical observations, patient history, and epidemiological information.  Fact Sheet for Patients:   StrictlyIdeas.no  Fact Sheet for Healthcare Providers: BankingDealers.co.za  This test is not yet approved or  cleared by the [place] FDA and has been authorized for detection and/or diagnosis of SARS-CoV-2 by FDA under an Emergency Use Authorization (EUA).  This EUA will remain in effect (meaning this test can be used) for the duration of the COVID-19 declaration under Section 564(b)(1) of the Act, 21 U.S.C. section 360bbb-3(b)(1), unless the authorization is terminated or revoked sooner.  Performed at [facility], [place] [address], [place], [place] [address]   MRSA PCR Screening     Status: Abnormal   Collection Time: 12/02/19 10:57 PM   Specimen: Nasopharyngeal  Result Value Ref Range Status   MRSA by PCR POSITIVE (A) NEGATIVE Final    Comment:        The GeneXpert MRSA Assay (FDA approved for NASAL specimens only), is one component of a comprehensive MRSA colonization surveillance program. It is not intended to diagnose MRSA infection nor to guide or monitor treatment for MRSA infections. RESULT CALLED TO, READ BACK BY AND VERIFIED WITHPhil Dopp RN 12/03/19 0056 JDW Performed at [facility], [place] [address] [place], [place] [address]   Culture, blood (routine x 2)     Status: None (Preliminary result)   Collection Time: 12/04/19  7:25 PM   Specimen: BLOOD RIGHT HAND  Result Value Ref Range Status   Specimen Description BLOOD RIGHT HAND  Final   Special Requests   Final    BOTTLES DRAWN AEROBIC AND ANAEROBIC Blood Culture adequate volume   Culture   Final  NO GROWTH < 12 HOURS Performed at [facility] [address] [place], [place] [address]    Report Status PENDING  Incomplete  Culture, blood (routine x 2)     Status: None (Preliminary result)   Collection Time:  12/04/19  7:30 PM   Specimen: BLOOD  Result Value Ref Range Status   Specimen Description BLOOD RIGHT ANTECUBITAL  Final   Special Requests   Final    BOTTLES DRAWN AEROBIC AND ANAEROBIC Blood Culture adequate volume   Culture   Final    NO GROWTH < 12 HOURS Performed at [facility], [place] [address] [place], [place] [address]    Report Status PENDING  Incomplete  SARS Coronavirus 2 by RT PCR (hospital order, performed in [facility] hospital lab) Nasopharyngeal Nasopharyngeal Swab     Status: None   Collection Time: 12/04/19  8:50 PM   Specimen: Nasopharyngeal Swab  Result Value Ref Range Status   SARS Coronavirus 2 NEGATIVE NEGATIVE Final    Comment: (NOTE) SARS-CoV-2 target nucleic acids are NOT DETECTED.  The SARS-CoV-2 RNA is generally detectable in upper and lower respiratory specimens during the acute phase of infection. The lowest concentration of SARS-CoV-2 viral copies this assay can detect is 250 copies / mL. A negative result does not preclude SARS-CoV-2 infection and should not be used as the sole basis for treatment or other patient management decisions.  A negative result may occur with improper specimen collection / handling, submission of specimen other than nasopharyngeal swab, presence of viral mutation(s) within the areas targeted by this assay, and inadequate number of viral copies (<250 copies / mL). A negative result must be combined with clinical observations, patient history, and epidemiological information.  Fact Sheet for Patients:   StrictlyIdeas.no  Fact Sheet for Healthcare Providers: BankingDealers.co.za  This test is not yet approved or  cleared by the [place] FDA and has been authorized for detection and/or diagnosis of SARS-CoV-2 by FDA under an Emergency Use Authorization (EUA).  This EUA will remain in effect (meaning this test can be used) for the duration of the COVID-19  declaration under Section 564(b)(1) of the Act, 21 U.S.C. section 360bbb-3(b)(1), unless the authorization is terminated or revoked sooner.  Performed at [facility], [place] [address] [place], [place] [address]       Radiology Studies: No results found.  Scheduled Meds: . bictegravir-emtricitabine-tenofovir AF  1 tablet Oral Daily  . heparin  2,000 Units Dialysis Once in dialysis  . heparin  5,000 Units Subcutaneous Q8H  . insulin aspart  0-9 Units Subcutaneous TID WC  . insulin NPH Human  7 Units Subcutaneous BID AC & HS   Continuous Infusions: . sodium chloride    . sodium chloride    . [START ON 12/06/2019] vancomycin       LOS: 0 days   Time spent: 35 minutes   Darliss Cheney, MD [facility]  12/05/2019, 1:49 PM   To contact the attending provider between 7A-7P or the covering provider during after hours 7P-7A, please log into the web site [email or web address].

## 2019-12-05 NOTE — Consult Note (Signed)
Date of Admission:  12/04/2019          Reason for Consult: MRSA bacteremia   Referring Provider: Dr Deretha Emory   Assessment:  1. MRSA bacteremia (recurrent) likely now from wound on stump 2. History of bilateral BKA's 1 site with a wound (note he has had problems with fit of prosthesis in the past) 3. End-stage renal disease on hemodialysis via fistula 4. HIV disease well-controlled on Biktarvy   Plan:  1. Start Zyvox 600 orally twice daily while we sort out what happened with vancomycin 2. Transthoracic echocardiogram and if this is unrevealing transesophageal echocardiogram 3. Vancomycin will be resumed once we can sort out the dosing. 4. Check HIV viral load 5. Continue BIKTARVY  Principal Problem:   MRSA bacteremia Active Problems:   Diabetes mellitus with ESRD (end-stage renal disease) ([place])   ESRD on dialysis ([place])   HIV disease ([place])   Amputation stump infection ([place])   Scheduled Meds:  bictegravir-emtricitabine-tenofovir AF  1 tablet Oral Daily   heparin  2,000 Units Dialysis Once in dialysis   heparin  5,000 Units Subcutaneous Q8H   insulin aspart  0-9 Units Subcutaneous TID WC   insulin NPH Human  7 Units Subcutaneous BID AC & HS   linezolid  600 mg Oral Q12H   Continuous Infusions:  sodium chloride     sodium chloride     [START ON 12/06/2019] vancomycin     PRN Meds:.sodium chloride, sodium chloride, acetaminophen **OR** acetaminophen, alteplase, heparin, heparin, lidocaine (PF), lidocaine-prilocaine, ondansetron **OR** ondansetron (ZOFRAN) IV, pentafluoroprop-tetrafluoroeth  HPI: Alvin Daniels is a 49 y.o. male Hispanic man living with HIV that has been perfectly controlled on Biktarvy, with comorbid end-stage renal disease on hemodialysis.  He has had a history of bilateral below the knee amputations and prior MRSA bacteremias.  He was admitted last week with drainage and pain from 2 wounds at his BKA site on the right.  Initially  I was called and told that he had purulent drainage and they were treating him with vancomycin.  Later I was called back and told that he did not have purulence and pictures were shown to me.  His blood cultures that time had not grown any organisms.  I had initially recommended giving him vancomycin with dialysis in particular given his history of MRSA bacteremia as an initial story that he had purulence.  The 2nd hospitalist wanted to strain him home on therapy for nonpurulent cellulitis and so Keflex was chosen.  In the interim his blood cultures turned positive the 1 of 2 sites.  Note we always treat MRSA as a pathogen whether is in 1 of 2 sites are both.  He certainly has a history of MRSA bacteremias and this should be treated as such.  I was under the understanding that he had a hemodialysis line but he clearly does not he has a fistula and it does not appear infected.  His wound of the stump site is not terribly purulent at this point in time and he no longer has pain there which he was having before.  He does need to have his valves evaluated with a transthoracic echocardiogram and transesophageal echocardiogram if the former is unrevealing.  He tells me that he did not receive vancomycin on Friday as was written in the note by pharmacy.  He is adamant that this is the case.  Therefore I am starting him on oral Zyvox and checking a vancomycin level.  I have discussed  with Ysidro Evert friends with the pharmacy as well.  Patient says he is renewed his HIV medication assistance program and he has been taking his Biktarvy faithfully.  Review of Systems: Review of Systems  Constitutional: Negative for chills, diaphoresis, fever, malaise/fatigue and weight loss.  HENT: Negative for congestion, hearing loss, sore throat and tinnitus.   Eyes: Negative for blurred vision and double vision.  Respiratory: Negative for cough, sputum production, shortness of breath and wheezing.     Cardiovascular: Negative for chest pain, palpitations and leg swelling.  Gastrointestinal: Negative for abdominal pain, blood in stool, constipation, diarrhea, heartburn, melena, nausea and vomiting.  Genitourinary: Negative for dysuria, flank pain and hematuria.  Musculoskeletal: Negative for back pain, falls, joint pain and myalgias.  Skin: Negative for itching and rash.  Neurological: Negative for dizziness, sensory change, focal weakness, loss of consciousness, weakness and headaches.  Endo/Heme/Allergies: Does not bruise/bleed easily.  Psychiatric/Behavioral: Negative for depression, memory loss and suicidal ideas. The patient is not nervous/anxious.     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) 06/17/2012   Uncontrolled diabetes mellitus with complications ([place]) 12/28/5629   Annotation: uncontrolled Qualifier: Diagnosis of  By: Nickola Major CMA ( AAMA), Jacqueline      Social History   Tobacco Use   Smoking status: Never Smoker   Smokeless tobacco: Never Used  Vaping Use   Vaping Use: Never used  Substance Use Topics   Alcohol use: No   Drug use: No    Family History  Problem Relation Age of Onset   Diabetes Mother    Diabetes Father    No Known  Allergies  OBJECTIVE: Blood pressure 110/73, pulse 77, temperature 98.2 F (36.8 C), temperature source Oral, resp. rate 16, weight 52.9 kg, SpO2 98 %.  Physical Exam Constitutional:      General: He is not in acute distress.    Appearance: Normal appearance. He is well-developed. He is not ill-appearing or diaphoretic.  HENT:     Head: Normocephalic and atraumatic.     Right Ear: Hearing and external ear normal.     Left Ear: Hearing and external ear normal.     Nose: No nasal deformity or rhinorrhea.  Eyes:     General: No scleral icterus.    Conjunctiva/sclera: Conjunctivae normal.     Right eye: Right conjunctiva is not injected.     Left eye: Left conjunctiva is not injected.     Pupils: Pupils are equal, round, and reactive to light.  Neck:     Vascular: No JVD.  Cardiovascular:     Rate and Rhythm: Normal rate and regular rhythm.     Heart sounds: Normal heart sounds, S1 normal and S2 normal.  No murmur heard.  No friction rub.  Pulmonary:     Effort: Pulmonary effort is normal. No respiratory distress.     Breath sounds: No stridor. No rhonchi.  Abdominal:     General: Bowel sounds are normal. There is no distension.     Palpations: Abdomen is soft. There is no mass.     Tenderness: There is no abdominal tenderness.  Musculoskeletal:     Right shoulder: Normal.     Left shoulder: Normal.     Cervical back: Normal range of motion and neck supple.     Right hip: Normal.     Left hip: Normal.     Right knee: Normal.     Left knee: Normal.  Lymphadenopathy:     Head:     Right side of head: No submandibular, preauricular or posterior auricular adenopathy.     Left side of head: No submandibular, preauricular or posterior auricular adenopathy.     Cervical: No cervical adenopathy.     Right cervical: No superficial or deep cervical adenopathy.    Left cervical: No superficial or deep cervical adenopathy.  Skin:    General: Skin is warm and dry.     Coloration:  Skin is not pale.     Findings: No abrasion, bruising, ecchymosis, erythema, lesion or rash.     Nails: There is no clubbing.  Neurological:     General: No focal deficit present.     Mental Status: He is alert and oriented to person, place, and time.     Sensory: No sensory deficit.     Coordination: Coordination normal.     Gait: Gait normal.  Psychiatric:        Attention and Perception: He is attentive.        Mood and Affect: Mood normal.        Speech: Speech normal.        Behavior: Behavior normal. Behavior is cooperative.        Thought Content: Thought content normal.        Judgment: Judgment normal.    Right B AKA site with wound see picture December 05, 2019:        Lab Results Lab Results  Component Value Date   WBC 9.1 12/05/2019   HGB 10.9 (L) 12/05/2019   HCT 32.6 (L) 12/05/2019   MCV 101.2 (H) 12/05/2019   PLT 219 12/05/2019    Lab Results  Component Value Date   CREATININE 8.26 (H) 12/05/2019   BUN 31 (H) 12/05/2019   NA 134 (L) 12/05/2019   K 3.8 12/05/2019   CL 95 (L) 12/05/2019   CO2 22 12/05/2019    Lab Results  Component Value Date   ALT 38 10/02/2019   AST 55 (H) 10/02/2019   ALKPHOS 161 (H) 10/02/2019   BILITOT 1.5 (H) 10/02/2019     Microbiology: Recent Results (from the past 240 hour(s))  Blood culture (routine x 2)     Status: None (Preliminary result)   Collection Time: 12/02/19  3:43 AM   Specimen: BLOOD RIGHT FOREARM  Result Value Ref Range Status   Specimen Description   Final    BLOOD RIGHT FOREARM Performed at [facility], [address], [place], [place] [address]    Special Requests   Final    BOTTLES DRAWN AEROBIC AND ANAEROBIC Blood Culture adequate volume Performed at [facility] [address] [place], [place] [address]    Culture  Final    NO GROWTH 3 DAYS Performed at [facility], [place] [address] [place], [place] [address]    Report Status PENDING  Incomplete  Blood  culture (routine x 2)     Status: Abnormal   Collection Time: 12/02/19  3:44 AM   Specimen: BLOOD LEFT HAND  Result Value Ref Range Status   Specimen Description   Final    BLOOD LEFT HAND Performed at [facility] [address] [place], [place] [address]    Special Requests   Final    BOTTLES DRAWN AEROBIC AND ANAEROBIC Blood Culture adequate volume Performed at [facility] [address], [place], [place] [address]    Culture  Setup Time   Final    GRAM POSITIVE COCCI IN CLUSTERS ANAEROBIC BOTTLE ONLY CRITICAL RESULT CALLED TO, READ BACK BY AND VERIFIED WITH: DR Doristine Bosworth RAVI [id number] AT 292 AM BY CM Performed at [facility], [place] [address], [place],  [address]    Culture METHICILLIN RESISTANT STAPHYLOCOCCUS AUREUS (A)  Final   Report Status 12/05/2019 FINAL  Final   Organism ID, Bacteria METHICILLIN RESISTANT STAPHYLOCOCCUS AUREUS  Final      Susceptibility   Methicillin resistant staphylococcus aureus - MIC*    CIPROFLOXACIN <=0.5 SENSITIVE Sensitive     ERYTHROMYCIN >=8 RESISTANT Resistant     GENTAMICIN <=0.5 SENSITIVE Sensitive     OXACILLIN >=4 RESISTANT Resistant     TETRACYCLINE >=16 RESISTANT Resistant     VANCOMYCIN <=0.5 SENSITIVE Sensitive     TRIMETH/SULFA <=10 SENSITIVE Sensitive     CLINDAMYCIN <=0.25 SENSITIVE Sensitive     RIFAMPIN <=0.5 SENSITIVE Sensitive     Inducible Clindamycin NEGATIVE Sensitive     * METHICILLIN RESISTANT STAPHYLOCOCCUS AUREUS  Blood Culture ID Panel (Reflexed)     Status: Abnormal   Collection Time: 12/02/19  3:44 AM  Result Value Ref Range Status   Enterococcus species NOT DETECTED NOT DETECTED Final   Listeria monocytogenes NOT DETECTED NOT DETECTED Final   Staphylococcus species DETECTED (A) NOT DETECTED Final    Comment: CRITICAL RESULT CALLED TO, READ BACK BY AND VERIFIED WITH: DR Doristine Bosworth R [id number] AT 835 AM BY CM    Staphylococcus aureus (BCID) DETECTED (A) NOT DETECTED  Final    Comment: Methicillin (oxacillin)-resistant Staphylococcus aureus (MRSA). MRSA is predictably resistant to beta-lactam antibiotics (except ceftaroline). Preferred therapy is vancomycin unless clinically contraindicated. Patient requires contact precautions if  hospitalized. CRITICAL RESULT CALLED TO, READ BACK BY AND VERIFIED WITH: DR Loann Quill [id number] AT 835 BY CM    Methicillin resistance DETECTED (A) NOT DETECTED Final    Comment: CRITICAL RESULT CALLED TO, READ BACK BY AND VERIFIED WITH: DR Doristine Bosworth R [id number] AT 835 AM BY CM    Streptococcus species NOT DETECTED NOT DETECTED Final   Streptococcus agalactiae NOT DETECTED NOT DETECTED Final   Streptococcus pneumoniae NOT DETECTED NOT DETECTED Final   Streptococcus pyogenes NOT DETECTED NOT DETECTED Final   Acinetobacter baumannii NOT DETECTED NOT DETECTED Final   Enterobacteriaceae species NOT DETECTED NOT DETECTED Final   Enterobacter cloacae complex NOT DETECTED NOT DETECTED Final   Escherichia coli NOT DETECTED NOT DETECTED Final   Klebsiella oxytoca NOT DETECTED NOT DETECTED Final   Klebsiella pneumoniae NOT DETECTED NOT DETECTED Final   Proteus species NOT DETECTED NOT DETECTED Final   Serratia marcescens NOT DETECTED NOT DETECTED Final   Haemophilus influenzae NOT DETECTED NOT DETECTED Final   Neisseria meningitidis  NOT DETECTED NOT DETECTED Final   Pseudomonas aeruginosa NOT DETECTED NOT DETECTED Final   Candida albicans NOT DETECTED NOT DETECTED Final   Candida glabrata NOT DETECTED NOT DETECTED Final   Candida krusei NOT DETECTED NOT DETECTED Final   Candida parapsilosis NOT DETECTED NOT DETECTED Final   Candida tropicalis NOT DETECTED NOT DETECTED Final    Comment: Performed at [facility], [place] [address], [place], [place] [address]  SARS Coronavirus 2 by RT PCR (hospital order, performed in [facility] hospital lab) Nasopharyngeal Nasopharyngeal Swab     Status: None   Collection Time: 12/02/19  6:53 AM    Specimen: Nasopharyngeal Swab  Result Value Ref Range Status   SARS Coronavirus 2 NEGATIVE NEGATIVE Final    Comment: (NOTE) SARS-CoV-2 target nucleic acids are NOT DETECTED.  The SARS-CoV-2 RNA is generally detectable in upper and lower respiratory specimens during the acute phase of infection. The lowest concentration of SARS-CoV-2 viral copies this assay can detect is 250 copies / mL. A negative result does not preclude SARS-CoV-2 infection and should not be used as the sole basis for treatment or other patient management decisions.  A negative result may occur with improper specimen collection / handling, submission of specimen other than nasopharyngeal swab, presence of viral mutation(s) within the areas targeted by this assay, and inadequate number of viral copies (<250 copies / mL). A negative result must be combined with clinical observations, patient history, and epidemiological information.  Fact Sheet for Patients:   StrictlyIdeas.no  Fact Sheet for Healthcare Providers: BankingDealers.co.za  This test is not yet approved or  cleared by the [place] FDA and has been authorized for detection and/or diagnosis of SARS-CoV-2 by FDA under an Emergency Use Authorization (EUA).  This EUA will remain in effect (meaning this test can be used) for the duration of the COVID-19 declaration under Section 564(b)(1) of the Act, 21 U.S.C. section 360bbb-3(b)(1), unless the authorization is terminated or revoked sooner.  Performed at [facility], [place] [address] [place], [place] [address]   MRSA PCR Screening     Status: Abnormal   Collection Time: 12/02/19 10:57 PM   Specimen: Nasopharyngeal  Result Value Ref Range Status   MRSA by PCR POSITIVE (A) NEGATIVE Final    Comment:        The GeneXpert MRSA Assay (FDA approved for NASAL specimens only), is one component of a comprehensive MRSA  colonization surveillance program. It is not intended to diagnose MRSA infection nor to guide or monitor treatment for MRSA infections. RESULT CALLED TO, READ BACK BY AND VERIFIED WITHPhil Dopp RN 12/03/19 0056 JDW Performed at [facility], [place] [address], [place], [place] [address]   Culture, blood (routine x 2)     Status: None (Preliminary result)   Collection Time: 12/04/19  7:25 PM   Specimen: BLOOD RIGHT HAND  Result Value Ref Range Status   Specimen Description BLOOD RIGHT HAND  Final   Special Requests   Final    BOTTLES DRAWN AEROBIC AND ANAEROBIC Blood Culture adequate volume   Culture   Final    NO GROWTH < 12 HOURS Performed at [facility], [place] [address] [place],  [address]    Report Status PENDING  Incomplete  Culture, blood (routine x 2)     Status: None (Preliminary result)   Collection Time: 12/04/19  7:30 PM   Specimen: BLOOD  Result Value Ref Range Status   Specimen  Description BLOOD RIGHT ANTECUBITAL  Final   Special Requests   Final    BOTTLES DRAWN AEROBIC AND ANAEROBIC Blood Culture adequate volume   Culture   Final    NO GROWTH < 12 HOURS Performed at [facility], [address], [place], [place] [address]    Report Status PENDING  Incomplete  SARS Coronavirus 2 by RT PCR (hospital order, performed in [facility] hospital lab) Nasopharyngeal Nasopharyngeal Swab     Status: None   Collection Time: 12/04/19  8:50 PM   Specimen: Nasopharyngeal Swab  Result Value Ref Range Status   SARS Coronavirus 2 NEGATIVE NEGATIVE Final    Comment: (NOTE) SARS-CoV-2 target nucleic acids are NOT DETECTED.  The SARS-CoV-2 RNA is generally detectable in upper and lower respiratory specimens during the acute phase of infection. The lowest concentration of SARS-CoV-2 viral copies this assay can detect is 250 copies / mL. A negative result does not preclude SARS-CoV-2 infection and should not be used as the sole basis for treatment or  other patient management decisions.  A negative result may occur with improper specimen collection / handling, submission of specimen other than nasopharyngeal swab, presence of viral mutation(s) within the areas targeted by this assay, and inadequate number of viral copies (<250 copies / mL). A negative result must be combined with clinical observations, patient history, and epidemiological information.  Fact Sheet for Patients:   StrictlyIdeas.no  Fact Sheet for Healthcare Providers: BankingDealers.co.za  This test is not yet approved or  cleared by the [place] FDA and has been authorized for detection and/or diagnosis of SARS-CoV-2 by FDA under an Emergency Use Authorization (EUA).  This EUA will remain in effect (meaning this test can be used) for the duration of the COVID-19 declaration under Section 564(b)(1) of the Act, 21 U.S.C. section 360bbb-3(b)(1), unless the authorization is terminated or revoked sooner.  Performed at [facility], [place] [address] [place], [place] [address]     Alcide Evener, [facility] [phone] pager  12/05/2019, 1:25 PM

## 2019-12-05 NOTE — Progress Notes (Signed)
New Admission Note:   Arrival Method: Via Wheelchair from the ED Mental Orientation:  A & O x 4 Telemetry:  N/A Assessment: Completed Skin:  Refused full skin assessment - dressing to right stump - was able to view skin - several scabbed areas IV:  Right arm NSL Pain: c/o right chest pain 5/10 - chronic in nature Tubes:  None Safety Measures: Safety Fall Prevention Plan has been given, discussed and signed Admission: Completed 5 MW Orientation: Patient has been orientated to the room, unit and staff.  Family:  None at bedside  Patient speaks Spanish only - Interpreter through [phone] was utilized with all contact with patient.  Patient is currently very irate.  He states that he was told to come to the emergency room immediately.  That it was an emergency that he come so he could have IV antibiotics.  Upon review of the charge, patient was discharged Friday, 12/03/2019.  He was discharged on Ancef for cellulitis to right stump.  B/C came back with one bottle positive for MRSA and Ancef does not cover this.  Per MD note and pharmacy note, patient to have IV Vancomycin with OP dialysis on MWF.  He was admitted so that this could be arranged with the OP facility and hopefully be discharged home Sunday.  Patient is irate because he states he was told that he had to come to the hospital so he could get the antibiotic now!  Not wait till Monday.  Also, if it was to be done OP, he could have stayed at home.  [facility] made aware that patient was irate.  I went back to explain to the patient that if we were unable to get the ABX arranged for Monday, he would be in the hospital and be able to get the Vanc while here in HD on Monday.  If we are able to get everything arranged on Sunday then he could be discharged and get it at the OP dialysis center on Monday.  Patient still continues to be irate but willing to stay still Sunday morning to see if it can be arranged.  He is also refusing  full skin assessment.  Unable to do admission history due to patient being irate.  Clothes, cell phone, and left leg prosthesis are at the bedside.  He is refusing bed alarm.  He gets up to the bathroom with walker and prosthesis.    Orders have been reviewed and implemented. Will continue to monitor the patient. Call light has been placed within reach and bed alarm has been activated.   Earleen Reaper RN- BC, [facility] Phone number: [phone]

## 2019-12-06 ENCOUNTER — Inpatient Hospital Stay (HOSPITAL_COMMUNITY): Payer: Self-pay

## 2019-12-06 ENCOUNTER — Other Ambulatory Visit: Payer: Self-pay

## 2019-12-06 ENCOUNTER — Encounter (HOSPITAL_COMMUNITY): Payer: Self-pay | Admitting: Family Medicine

## 2019-12-06 DIAGNOSIS — R7881 Bacteremia: Secondary | ICD-10-CM

## 2019-12-06 LAB — RENAL FUNCTION PANEL
Albumin: 2.5 g/dL — ABNORMAL LOW (ref 3.5–5.0)
Anion gap: 17 — ABNORMAL HIGH (ref 5–15)
BUN: 49 mg/dL — ABNORMAL HIGH (ref 6–20)
CO2: 21 mmol/L — ABNORMAL LOW (ref 22–32)
Calcium: 9.3 mg/dL (ref 8.9–10.3)
Chloride: 96 mmol/L — ABNORMAL LOW (ref 98–111)
Creatinine, Ser: 11.85 mg/dL — ABNORMAL HIGH (ref 0.61–1.24)
GFR calc Af Amer: 5 mL/min — ABNORMAL LOW (ref >60)
GFR calc non Af Amer: 4 mL/min — ABNORMAL LOW (ref >60)
Glucose, Bld: 183 mg/dL — ABNORMAL HIGH (ref 70–99)
Phosphorus: 7 mg/dL — ABNORMAL HIGH (ref 2.5–4.6)
Potassium: 4.4 mmol/L (ref 3.5–5.1)
Sodium: 134 mmol/L — ABNORMAL LOW (ref 135–145)

## 2019-12-06 LAB — CBC WITH DIFFERENTIAL/PLATELET
Abs Immature Granulocytes: 0.05 10*3/uL (ref 0.00–0.07)
Basophils Absolute: 0.1 10*3/uL (ref 0.0–0.1)
Basophils Relative: 1 %
Eosinophils Absolute: 0.6 10*3/uL — ABNORMAL HIGH (ref 0.0–0.5)
Eosinophils Relative: 7 %
HCT: 32.9 % — ABNORMAL LOW (ref 39.0–52.0)
Hemoglobin: 11.1 g/dL — ABNORMAL LOW (ref 13.0–17.0)
Immature Granulocytes: 1 %
Lymphocytes Relative: 24 %
Lymphs Abs: 1.9 10*3/uL (ref 0.7–4.0)
MCH: 33.7 pg (ref 26.0–34.0)
MCHC: 33.7 g/dL (ref 30.0–36.0)
MCV: 100 fL (ref 80.0–100.0)
Monocytes Absolute: 0.8 10*3/uL (ref 0.1–1.0)
Monocytes Relative: 10 %
Neutro Abs: 4.6 10*3/uL (ref 1.7–7.7)
Neutrophils Relative %: 57 %
Platelets: 240 10*3/uL (ref 150–400)
RBC: 3.29 MIL/uL — ABNORMAL LOW (ref 4.22–5.81)
RDW: 13 % (ref 11.5–15.5)
WBC: 8 10*3/uL (ref 4.0–10.5)
nRBC: 0 % (ref 0.0–0.2)

## 2019-12-06 LAB — GLUCOSE, CAPILLARY
Glucose-Capillary: 176 mg/dL — ABNORMAL HIGH (ref 70–99)
Glucose-Capillary: 113 mg/dL — ABNORMAL HIGH (ref 70–99)
Glucose-Capillary: 230 mg/dL — ABNORMAL HIGH (ref 70–99)
Glucose-Capillary: 154 mg/dL — ABNORMAL HIGH (ref 70–99)

## 2019-12-06 LAB — ECHOCARDIOGRAM LIMITED: Weight: 1767.21 oz

## 2019-12-06 MED ORDER — INSULIN NPH (HUMAN) (ISOPHANE) 100 UNIT/ML ~~LOC~~ SUSP
10.0000 [IU] | Freq: Two times a day (BID) | SUBCUTANEOUS | Status: DC
  Administered 2019-12-06 – 2019-12-07 (×2): 10 [IU] via SUBCUTANEOUS
  Filled 2019-12-06: qty 10

## 2019-12-06 MED ORDER — HEPARIN SODIUM (PORCINE) 1000 UNIT/ML IJ SOLN
INTRAMUSCULAR | Status: AC
  Administered 2019-12-06: 2000 [IU] via INTRAVENOUS_CENTRAL
  Filled 2019-12-06: qty 2

## 2019-12-06 MED ORDER — VANCOMYCIN HCL IN DEXTROSE 500-5 MG/100ML-% IV SOLN
INTRAVENOUS | Status: AC
  Administered 2019-12-06: 500 mg
  Filled 2019-12-06: qty 100

## 2019-12-06 NOTE — Progress Notes (Signed)
Subjective: No new complaints, he is still bothered that he did not receive antibiotics and I had to explain this to him again that he had active antibiotics in his system when we checked a vancomycin level yesterday  Antibiotics:  Anti-infectives (From admission, onward)   Start     Dose/Rate Route Frequency Ordered Stop   12/06/19 1200  vancomycin (VANCOREADY) IVPB 500 mg/100 mL     Discontinue     500 mg 100 mL/hr over 60 Minutes Intravenous Every M-W-F (Hemodialysis) 12/04/19 1912     12/06/19 1033  vancomycin (VANCOCIN) 500-5 MG/100ML-% IVPB       Note to Pharmacy: Murriel Hopper   : cabinet override      12/06/19 1033 12/06/19 1052   12/05/19 1315  linezolid (ZYVOX) tablet 600 mg  Status:  Discontinued        600 mg Oral Every 12 hours 12/05/19 1231 12/05/19 1336   12/05/19 1000  bictegravir-emtricitabine-tenofovir AF (BIKTARVY) 50-200-25 MG per tablet 1 tablet     Discontinue     1 tablet Oral Daily 12/04/19 2040        Medications: Scheduled Meds:  bictegravir-emtricitabine-tenofovir AF  1 tablet Oral Daily   heparin  5,000 Units Subcutaneous Q8H   insulin aspart  0-9 Units Subcutaneous TID WC   insulin NPH Human  10 Units Subcutaneous BID AC & HS   Continuous Infusions:  vancomycin     PRN Meds:.acetaminophen **OR** acetaminophen, ondansetron **OR** ondansetron (ZOFRAN) IV    Objective: Weight change:   Intake/Output Summary (Last 24 hours) at 12/06/2019 1133 Last data filed at 12/06/2019 0600 Gross per 24 hour  Intake 360 ml  Output 0 ml  Net 360 ml   Blood pressure (!) 82/52, pulse 73, temperature 98.4 F (36.9 C), temperature source Oral, resp. rate 19, weight 51.2 kg, SpO2 98 %. Temp:  [98 F (36.7 C)-98.8 F (37.1 C)] 98.4 F (36.9 C) (07/26 0800) Pulse Rate:  [73-87] 73 (07/26 1000) Resp:  [18-19] 19 (07/26 0800) BP: (66-136)/(46-86) 82/52 (07/26 1000) SpO2:  [94 %-98 %] 98 % (07/26 0800) Weight:  [51.2 kg] 51.2 kg (07/26  0800)  Physical Exam: General: Alert and awake, oriented x3, not in any acute distress. 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 C-Reactive Protein No results for input(s): CRP in the last 72 hours.  Micro Results: Recent Results (from the past 720 hour(s))  Blood culture (routine x 2)     Status: None (Preliminary result)   Collection Time: 12/02/19  3:43 AM   Specimen: BLOOD RIGHT FOREARM  Result Value Ref Range Status   Specimen Description   Final    BLOOD RIGHT FOREARM Performed at [facility], [address], [place], [place] [address]    Special Requests   Final    BOTTLES DRAWN AEROBIC AND ANAEROBIC Blood Culture  adequate volume Performed at [facility] [address] [place], [place] [address]    Culture   Final    NO GROWTH 4 DAYS Performed at [facility], [place] [address] [place], [place] [address]    Report Status PENDING  Incomplete  Blood culture (routine x 2)     Status: Abnormal   Collection Time: 12/02/19  3:44 AM   Specimen: BLOOD LEFT HAND  Result Value Ref Range Status   Specimen Description   Final    BLOOD LEFT HAND Performed at [facility] [address], [place], [place] [address]    Special Requests   Final    BOTTLES DRAWN AEROBIC AND ANAEROBIC Blood Culture adequate volume Performed at [facility] [address], [place], [place] [address]    Culture  Setup Time   Final    GRAM  POSITIVE COCCI IN CLUSTERS ANAEROBIC BOTTLE ONLY CRITICAL RESULT CALLED TO, READ BACK BY AND VERIFIED WITH: DR Doristine Bosworth RAVI [id number] AT 308 AM BY CM Performed at [facility], [place] [address], [place], [place] [address]    Culture METHICILLIN RESISTANT STAPHYLOCOCCUS AUREUS (A)  Final   Report Status 12/05/2019 FINAL  Final   Organism ID, Bacteria METHICILLIN RESISTANT STAPHYLOCOCCUS AUREUS  Final      Susceptibility   Methicillin resistant staphylococcus aureus - MIC*    CIPROFLOXACIN <=0.5 SENSITIVE Sensitive     ERYTHROMYCIN >=8 RESISTANT Resistant     GENTAMICIN <=0.5 SENSITIVE Sensitive     OXACILLIN >=4 RESISTANT Resistant     TETRACYCLINE >=16 RESISTANT Resistant     VANCOMYCIN <=0.5 SENSITIVE Sensitive     TRIMETH/SULFA <=10 SENSITIVE Sensitive     CLINDAMYCIN <=0.25 SENSITIVE Sensitive     RIFAMPIN <=0.5 SENSITIVE Sensitive     Inducible Clindamycin NEGATIVE Sensitive     * METHICILLIN RESISTANT STAPHYLOCOCCUS AUREUS  Blood Culture ID Panel (Reflexed)     Status: Abnormal   Collection Time: 12/02/19  3:44 AM  Result Value Ref Range Status   Enterococcus species NOT DETECTED NOT DETECTED Final   Listeria monocytogenes NOT DETECTED NOT DETECTED Final   Staphylococcus species DETECTED (A) NOT DETECTED Final    Comment: CRITICAL RESULT CALLED TO, READ BACK BY AND VERIFIED WITH: DR Doristine Bosworth R [id number] AT 835 AM BY CM    Staphylococcus aureus (BCID) DETECTED (A) NOT DETECTED Final    Comment: Methicillin (oxacillin)-resistant Staphylococcus aureus (MRSA). MRSA is predictably resistant to beta-lactam antibiotics (except ceftaroline). Preferred therapy is vancomycin unless clinically contraindicated. Patient requires contact precautions if  hospitalized. CRITICAL RESULT CALLED TO, READ BACK BY AND VERIFIED WITH: DR Loann Quill [id number] AT 835 BY CM    Methicillin resistance DETECTED (A) NOT DETECTED Final    Comment: CRITICAL RESULT CALLED TO, READ BACK BY AND VERIFIED WITH: DR  Doristine Bosworth R [id number] AT 835 AM BY CM    Streptococcus species NOT DETECTED NOT DETECTED Final   Streptococcus agalactiae NOT DETECTED NOT DETECTED Final   Streptococcus pneumoniae NOT DETECTED NOT DETECTED Final   Streptococcus pyogenes NOT DETECTED NOT DETECTED Final   Acinetobacter baumannii NOT DETECTED NOT DETECTED Final   Enterobacteriaceae species NOT DETECTED NOT DETECTED Final   Enterobacter cloacae complex NOT DETECTED NOT DETECTED Final   Escherichia coli NOT DETECTED NOT DETECTED Final   Klebsiella oxytoca NOT DETECTED NOT DETECTED Final   Klebsiella pneumoniae NOT DETECTED NOT DETECTED Final   Proteus species NOT DETECTED NOT DETECTED Final  Serratia marcescens NOT DETECTED NOT DETECTED Final   Haemophilus influenzae NOT DETECTED NOT DETECTED Final   Neisseria meningitidis NOT DETECTED NOT DETECTED Final   Pseudomonas aeruginosa NOT DETECTED NOT DETECTED Final   Candida albicans NOT DETECTED NOT DETECTED Final   Candida glabrata NOT DETECTED NOT DETECTED Final   Candida krusei NOT DETECTED NOT DETECTED Final   Candida parapsilosis NOT DETECTED NOT DETECTED Final   Candida tropicalis NOT DETECTED NOT DETECTED Final    Comment: Performed at [facility], [place] [address], [place], [place] [address]  SARS Coronavirus 2 by RT PCR (hospital order, performed in [facility] hospital lab) Nasopharyngeal Nasopharyngeal Swab     Status: None   Collection Time: 12/02/19  6:53 AM   Specimen: Nasopharyngeal Swab  Result Value Ref Range Status   SARS Coronavirus 2 NEGATIVE NEGATIVE Final    Comment: (NOTE) SARS-CoV-2 target nucleic acids are NOT DETECTED.  The SARS-CoV-2 RNA is generally detectable in upper and lower respiratory specimens during the acute phase of infection. The lowest concentration of SARS-CoV-2 viral copies this assay can detect is 250 copies / mL. A negative result does not preclude SARS-CoV-2 infection and should not be used as the sole basis for treatment or  other patient management decisions.  A negative result may occur with improper specimen collection / handling, submission of specimen other than nasopharyngeal swab, presence of viral mutation(s) within the areas targeted by this assay, and inadequate number of viral copies (<250 copies / mL). A negative result must be combined with clinical observations, patient history, and epidemiological information.  Fact Sheet for Patients:   StrictlyIdeas.no  Fact Sheet for Healthcare Providers: BankingDealers.co.za  This test is not yet approved or  cleared by the [place] FDA and has been authorized for detection and/or diagnosis of SARS-CoV-2 by FDA under an Emergency Use Authorization (EUA).  This EUA will remain in effect (meaning this test can be used) for the duration of the COVID-19 declaration under Section 564(b)(1) of the Act, 21 U.S.C. section 360bbb-3(b)(1), unless the authorization is terminated or revoked sooner.  Performed at [facility], [place] [address], [place], [place] [address]   MRSA PCR Screening     Status: Abnormal   Collection Time: 12/02/19 10:57 PM   Specimen: Nasopharyngeal  Result Value Ref Range Status   MRSA by PCR POSITIVE (A) NEGATIVE Final    Comment:        The GeneXpert MRSA Assay (FDA approved for NASAL specimens only), is one component of a comprehensive MRSA colonization surveillance program. It is not intended to diagnose MRSA infection nor to guide or monitor treatment for MRSA infections. RESULT CALLED TO, READ BACK BY AND VERIFIED WITHPhil Dopp RN 12/03/19 0056 JDW Performed at [name], [place] [address], [place], [place] [address]   Culture, blood (routine x 2)     Status: None (Preliminary result)   Collection Time: 12/04/19  7:25 PM   Specimen: BLOOD RIGHT HAND  Result Value Ref Range Status   Specimen Description BLOOD RIGHT HAND  Final   Special  Requests   Final    BOTTLES DRAWN AEROBIC AND ANAEROBIC Blood Culture adequate volume   Culture   Final    NO GROWTH 2 DAYS Performed at [facility], [place] [address], [place], [place] [address]    Report Status PENDING  Incomplete  Culture, blood (routine x 2)     Status: None (Preliminary result)  Collection Time: 12/04/19  7:30 PM   Specimen: BLOOD  Result Value Ref Range Status   Specimen Description BLOOD RIGHT ANTECUBITAL  Final   Special Requests   Final    BOTTLES DRAWN AEROBIC AND ANAEROBIC Blood Culture adequate volume   Culture   Final    NO GROWTH 2 DAYS Performed at [facility], [address] [place], [place] [address]    Report Status PENDING  Incomplete  SARS Coronavirus 2 by RT PCR (hospital order, performed in [facility] hospital lab) Nasopharyngeal Nasopharyngeal Swab     Status: None   Collection Time: 12/04/19  8:50 PM   Specimen: Nasopharyngeal Swab  Result Value Ref Range Status   SARS Coronavirus 2 NEGATIVE NEGATIVE Final    Comment: (NOTE) SARS-CoV-2 target nucleic acids are NOT DETECTED.  The SARS-CoV-2 RNA is generally detectable in upper and lower respiratory specimens during the acute phase of infection. The lowest concentration of SARS-CoV-2 viral copies this assay can detect is 250 copies / mL. A negative result does not preclude SARS-CoV-2 infection and should not be used as the sole basis for treatment or other patient management decisions.  A negative result may occur with improper specimen collection / handling, submission of specimen other than nasopharyngeal swab, presence of viral mutation(s) within the areas targeted by this assay, and inadequate number of viral copies (<250 copies / mL). A negative result must be combined with clinical observations, patient history, and epidemiological information.  Fact Sheet for Patients:   StrictlyIdeas.no  Fact Sheet for Healthcare  Providers: BankingDealers.co.za  This test is not yet approved or  cleared by the [place] FDA and has been authorized for detection and/or diagnosis of SARS-CoV-2 by FDA under an Emergency Use Authorization (EUA).  This EUA will remain in effect (meaning this test can be used) for the duration of the COVID-19 declaration under Section 564(b)(1) of the Act, 21 U.S.C. section 360bbb-3(b)(1), unless the authorization is terminated or revoked sooner.  Performed at [facility], [place] [address], [place], [place] [address]     Studies/Results: No results found.    Assessment/Plan:  INTERVAL HISTORY: we are discovering that Medicare wont pay for his antibiotics with HD because he has been on emergency coverage for HD x several years? This is very very troubling to me   Principal Problem:   MRSA bacteremia Active Problems:   Diabetes mellitus with ESRD (end-stage renal disease) ([place])   Cellulitis of right lower extremity   ESRD on dialysis ([place])   HIV disease ([place])   HIV (human immunodeficiency virus infection) ([place])   Amputation stump infection ([place])    Alvin Daniels is a 49 y.o. male with well-controlled HIV but comorbid end-stage renal disease on hemodialysis having been on dialysis for several years.  He has had several Staph aureus bacteremia has some of which have been associated with severe infections and his lower extremities and he undergone bilateral below the knee amputations.  He was admitted with pain at his right BKA site and has some ulcerated lesions there that are currently not draining any material.  He was treated with vancomycin while an inpatient and discharged on Keflex until one of his blood cultures came back positive for MRSA.  #1 MRSA bacteremia with multiple recurrences:  My intention was to treat him with vancomycin with hemodialysis under the presumption that he would receive the same care as other Medicare  covered dialysis patients and will be able to receive antibiotics  with dialysis.  He has been treated several times in the past and I hope it is not been the case that I thought he was being treated as an outpatient and he never was.?  He needs a transthoracic echocardiogram and if this is unrevealing a transesophageal echocardiogram.  #2 HIV: continue Biktarvy   LOS: 1 day   Alcide Evener 12/06/2019, 11:33 AM

## 2019-12-06 NOTE — Progress Notes (Signed)
PROGRESS NOTE    Alvin Daniels  PJK:[id number] DOB: Nov 10, 1970 DOA: 12/04/2019 PCP: Patient, No Pcp Per   Brief Narrative:  Alvin Daniels is a 49 y.o. male with medical history significant of DM2, HIV on HAART, ESRD on MWF dialysis (last on 23rd during hospital stay), BLE amputations. Pt was just admitted from 7/22-7/23 with cellulitis of his R BKA stump.  Pt was put on vancomycin during admission.  Cellulitis was significantly improved, Dr. Sharol Given felt it didn't need surgery, and pt discharged on 7/23 on Keflex as outpt per ID recommendations as his cellulitis was none to minimal and nonpurulent. However, since discharge yesterday, his one of the 2 bottles of BCx came back positive with MRSA.  Patient was called to come back to the ED for further evaluation.  Patient asymptomatic.   Assessment & Plan:   Principal Problem:   MRSA bacteremia Active Problems:   Diabetes mellitus with ESRD (end-stage renal disease) ([place])   Cellulitis of right lower extremity   ESRD on dialysis ([place])   HIV disease ([place])   HIV (human immunodeficiency virus infection) ([place])   Amputation stump infection ([place])  MRSA bacteremia/right stump cellulitis: Source likely right stump cellulitis.  Patient is asymptomatic.  Remains on IV vancomycin per ID recommendations.  Pending transthoracic echo.  If negative, will need TEE.  ESRD: Nephrology on board.  Management per them.  Type 2 diabetes mellitus: Takes 15 units of Novolin and twice daily at home however here he is only on 7 twice daily along with SSI.  Slightly hyperglycemic.  Increase Novolin to 10 units twice daily and continue SSI.  HIV: Follows ID clinic.  Management per ID.  DVT prophylaxis: heparin injection 5,000 Units Start: 12/04/19 2200   Code Status: Full Code  Family Communication: None present at bedside.  Plan of care discussed with patient in length and he verbalized understanding and agreed with it.  Status is:  Inpatient  Remains inpatient appropriate because:Inpatient level of care appropriate due to severity of illness   Dispo: The patient is from: Home              Anticipated d/c is to: Home              Anticipated d/c date is: 2 days              Patient currently is not medically stable to d/c.        Estimated body mass index is 22.04 kg/m as calculated from the following:   Height as of 09/27/19: 5' (1.524 m).   Weight as of this encounter: 51.2 kg.      Nutritional status:               Consultants:   ID and nephrology  Procedures:   None  Antimicrobials:  Anti-infectives (From admission, onward)   Start     Dose/Rate Route Frequency Ordered Stop   12/06/19 1200  vancomycin (VANCOREADY) IVPB 500 mg/100 mL     Discontinue     500 mg 100 mL/hr over 60 Minutes Intravenous Every M-W-F (Hemodialysis) 12/04/19 1912     12/06/19 1033  vancomycin (VANCOCIN) 500-5 MG/100ML-% IVPB       Note to Pharmacy: Murriel Hopper   : cabinet override      12/06/19 1033 12/06/19 2244   12/05/19 1315  linezolid (ZYVOX) tablet 600 mg  Status:  Discontinued        600 mg Oral Every 12 hours 12/05/19 1231 12/05/19 1336  12/05/19 1000  bictegravir-emtricitabine-tenofovir AF (BIKTARVY) 50-200-25 MG per tablet 1 tablet     Discontinue     1 tablet Oral Daily 12/04/19 2040           Subjective: Seen and examined in dialysis unit.  He has no complaints.  Interpreter used.  Objective: Vitals:   12/06/19 0915 12/06/19 0930 12/06/19 0945 12/06/19 1000  BP: (!) 66/50 (!) 66/46 (!) 70/47 (!) 82/52  Pulse: 79 84 86 73  Resp:      Temp:      TempSrc:      SpO2:      Weight:        Intake/Output Summary (Last 24 hours) at 12/06/2019 1049 Last data filed at 12/06/2019 0600 Gross per 24 hour  Intake 360 ml  Output 0 ml  Net 360 ml   Filed Weights   12/04/19 2235 12/06/19 0800  Weight: 52.9 kg 51.2 kg    Examination:  General exam: Appears calm and comfortable   Respiratory system: Clear to auscultation. Respiratory effort normal. Cardiovascular system: S1 & S2 heard, RRR. No JVD, murmurs, rubs, gallops or clicks. No pedal edema. Gastrointestinal system: Abdomen is nondistended, soft and nontender. No organomegaly or masses felt. Normal bowel sounds heard. Central nervous system: Alert and oriented. No focal neurological deficits. Extremities: Bilateral BKA Skin: Superficial ulcers at the right stump.  No drainage.  No tenderness or warmth. Psychiatry: Judgement and insight appear normal. Mood & affect appropriate.         Data Reviewed: I have personally reviewed following labs and imaging studies  CBC: Recent Labs  Lab 12/02/19 0343 12/03/19 0448 12/04/19 1521 12/05/19 0042 12/06/19 0230  WBC 10.2 9.6 10.3 9.1 8.0  NEUTROABS 7.2  --   --   --  4.6  HGB 11.9* 11.2* 10.9* 10.9* 11.1*  HCT 35.5* 33.4* 33.9* 32.6* 32.9*  MCV 102.9* 101.8* 102.7* 101.2* 100.0  PLT 173 172 208 219 914   Basic Metabolic Panel: Recent Labs  Lab 12/02/19 0343 12/03/19 0448 12/04/19 1521 12/05/19 0042 12/06/19 0759  NA 139 136 134* 134* 134*  K 3.8 4.0 3.7 3.8 4.4  CL 94* 93* 93* 95* 96*  CO2 32 28 26 22  21*  GLUCOSE 192* 179* 189* 245* 183*  BUN 17 24* 27* 31* 49*  CREATININE 6.26* 9.06* 6.96* 8.26* 11.85*  CALCIUM 9.0 8.9 9.6 9.3 9.3  PHOS  --   --   --   --  7.0*   GFR: Estimated Creatinine Clearance: 5.3 mL/min (A) (by C-G formula based on SCr of 11.85 mg/dL (H)). Liver Function Tests: Recent Labs  Lab 12/06/19 0759  ALBUMIN 2.5*   No results for input(s): LIPASE, AMYLASE in the last 168 hours. No results for input(s): AMMONIA in the last 168 hours. Coagulation Profile: No results for input(s): INR, PROTIME in the last 168 hours. Cardiac Enzymes: No results for input(s): CKTOTAL, CKMB, CKMBINDEX, TROPONINI in the last 168 hours. BNP (last 3 results) No results for input(s): PROBNP in the last 8760 hours. HbA1C: No results for  input(s): HGBA1C in the last 72 hours. CBG: Recent Labs  Lab 12/05/19 0735 12/05/19 1114 12/05/19 1628 12/05/19 2145 12/06/19 0636  GLUCAP 70 177* 195* 271* 176*   Lipid Profile: No results for input(s): CHOL, HDL, LDLCALC, TRIG, CHOLHDL, LDLDIRECT in the last 72 hours. Thyroid Function Tests: No results for input(s): TSH, T4TOTAL, FREET4, T3FREE, THYROIDAB in the last 72 hours. Anemia Panel: No results for input(s): VITAMINB12, FOLATE,  FERRITIN, TIBC, IRON, RETICCTPCT in the last 72 hours. Sepsis Labs: Recent Labs  Lab 12/02/19 0344 12/04/19 1925  LATICACIDVEN 0.8 0.7    Recent Results (from the past 240 hour(s))  Blood culture (routine x 2)     Status: None (Preliminary result)   Collection Time: 12/02/19  3:43 AM   Specimen: BLOOD RIGHT FOREARM  Result Value Ref Range Status   Specimen Description   Final    BLOOD RIGHT FOREARM Performed at [facility] [address] [place], [place] [address]    Special Requests   Final    BOTTLES DRAWN AEROBIC AND ANAEROBIC Blood Culture adequate volume Performed at [facility] [address], [place], [place] [address]    Culture   Final    NO GROWTH 4 DAYS Performed at [facility], [place] [address], [place], [place] [address]    Report Status PENDING  Incomplete  Blood culture (routine x 2)     Status: Abnormal   Collection Time: 12/02/19  3:44 AM   Specimen: BLOOD LEFT HAND  Result Value Ref Range Status   Specimen Description   Final    BLOOD LEFT HAND Performed at [facility] [address], [place], [place] [address]    Special Requests   Final    BOTTLES DRAWN AEROBIC AND ANAEROBIC Blood Culture adequate volume Performed at [facility] [address], [place], [place] [address]    Culture  Setup Time   Final    GRAM POSITIVE COCCI IN CLUSTERS ANAEROBIC BOTTLE ONLY CRITICAL RESULT CALLED TO, READ BACK BY AND VERIFIED WITH: DR Doristine Bosworth Lacharles Altschuler  [id number] AT 505 AM BY CM Performed at [facility], [place] [address], [place], [place] [address]    Culture METHICILLIN RESISTANT STAPHYLOCOCCUS AUREUS (A)  Final   Report Status 12/05/2019 FINAL  Final   Organism ID, Bacteria METHICILLIN RESISTANT STAPHYLOCOCCUS AUREUS  Final      Susceptibility   Methicillin resistant staphylococcus aureus - MIC*    CIPROFLOXACIN <=0.5 SENSITIVE Sensitive     ERYTHROMYCIN >=8 RESISTANT Resistant     GENTAMICIN <=0.5 SENSITIVE Sensitive     OXACILLIN >=4 RESISTANT Resistant     TETRACYCLINE >=16 RESISTANT Resistant     VANCOMYCIN <=0.5 SENSITIVE Sensitive     TRIMETH/SULFA <=10 SENSITIVE Sensitive     CLINDAMYCIN <=0.25 SENSITIVE Sensitive     RIFAMPIN <=0.5 SENSITIVE Sensitive     Inducible Clindamycin NEGATIVE Sensitive     * METHICILLIN RESISTANT STAPHYLOCOCCUS AUREUS  Blood Culture ID Panel (Reflexed)     Status: Abnormal   Collection Time: 12/02/19  3:44 AM  Result Value Ref Range Status   Enterococcus species NOT DETECTED NOT DETECTED Final   Listeria monocytogenes NOT DETECTED NOT DETECTED Final   Staphylococcus species DETECTED (A) NOT DETECTED Final    Comment: CRITICAL RESULT CALLED TO, READ BACK BY AND VERIFIED WITH: DR Doristine Bosworth R [id number] AT 835 AM BY CM    Staphylococcus aureus (BCID) DETECTED (A) NOT DETECTED Final    Comment: Methicillin (oxacillin)-resistant Staphylococcus aureus (MRSA). MRSA is predictably resistant to beta-lactam antibiotics (except ceftaroline). Preferred therapy is vancomycin unless clinically contraindicated. Patient requires contact precautions if  hospitalized. CRITICAL RESULT CALLED TO, READ BACK BY AND VERIFIED WITH: DR Loann Quill [id number] AT 835 BY CM    Methicillin resistance DETECTED (A) NOT DETECTED Final    Comment: CRITICAL RESULT CALLED TO, READ BACK BY AND VERIFIED WITH: DR  Odin R [id number] AT 5 AM BY CM    Streptococcus species NOT DETECTED NOT DETECTED Final   Streptococcus agalactiae NOT  DETECTED NOT DETECTED Final   Streptococcus pneumoniae NOT DETECTED NOT DETECTED Final   Streptococcus pyogenes NOT DETECTED NOT DETECTED Final   Acinetobacter baumannii NOT DETECTED NOT DETECTED Final   Enterobacteriaceae species NOT DETECTED NOT DETECTED Final   Enterobacter cloacae complex NOT DETECTED NOT DETECTED Final   Escherichia coli NOT DETECTED NOT DETECTED Final   Klebsiella oxytoca NOT DETECTED NOT DETECTED Final   Klebsiella pneumoniae NOT DETECTED NOT DETECTED Final   Proteus species NOT DETECTED NOT DETECTED Final   Serratia marcescens NOT DETECTED NOT DETECTED Final   Haemophilus influenzae NOT DETECTED NOT DETECTED Final   Neisseria meningitidis NOT DETECTED NOT DETECTED Final   Pseudomonas aeruginosa NOT DETECTED NOT DETECTED Final   Candida albicans NOT DETECTED NOT DETECTED Final   Candida glabrata NOT DETECTED NOT DETECTED Final   Candida krusei NOT DETECTED NOT DETECTED Final   Candida parapsilosis NOT DETECTED NOT DETECTED Final   Candida tropicalis NOT DETECTED NOT DETECTED Final    Comment: Performed at [facility], [place] [address], [place], [place] [address]  SARS Coronavirus 2 by RT PCR (hospital order, performed in [facility] hospital lab) Nasopharyngeal Nasopharyngeal Swab     Status: None   Collection Time: 12/02/19  6:53 AM   Specimen: Nasopharyngeal Swab  Result Value Ref Range Status   SARS Coronavirus 2 NEGATIVE NEGATIVE Final    Comment: (NOTE) SARS-CoV-2 target nucleic acids are NOT DETECTED.  The SARS-CoV-2 RNA is generally detectable in upper and lower respiratory specimens during the acute phase of infection. The lowest concentration of SARS-CoV-2 viral copies this assay can detect is 250 copies / mL. A negative result does not preclude SARS-CoV-2 infection and should not be used as the sole basis for treatment or other patient management decisions.  A negative result may occur with improper specimen collection / handling, submission of  specimen other than nasopharyngeal swab, presence of viral mutation(s) within the areas targeted by this assay, and inadequate number of viral copies (<250 copies / mL). A negative result must be combined with clinical observations, patient history, and epidemiological information.  Fact Sheet for Patients:   StrictlyIdeas.no  Fact Sheet for Healthcare Providers: BankingDealers.co.za  This test is not yet approved or  cleared by the [place] FDA and has been authorized for detection and/or diagnosis of SARS-CoV-2 by FDA under an Emergency Use Authorization (EUA).  This EUA will remain in effect (meaning this test can be used) for the duration of the COVID-19 declaration under Section 564(b)(1) of the Act, 21 U.S.C. section 360bbb-3(b)(1), unless the authorization is terminated or revoked sooner.  Performed at [facility], [place] [address] [place], [place] [address]   MRSA PCR Screening     Status: Abnormal   Collection Time: 12/02/19 10:57 PM   Specimen: Nasopharyngeal  Result Value Ref Range Status   MRSA by PCR POSITIVE (A) NEGATIVE Final    Comment:        The GeneXpert MRSA Assay (FDA approved for NASAL specimens only), is one component of a comprehensive MRSA colonization surveillance program. It is not intended to diagnose MRSA infection nor to guide or monitor treatment for MRSA infections. RESULT CALLED TO, READ BACK BY AND VERIFIED WITHPhil Dopp RN 12/03/19 0056 JDW Performed at [facility], [place] [address], [place], [place] [address]  Culture, blood (routine x 2)     Status: None (Preliminary result)   Collection Time: 12/04/19  7:25 PM   Specimen: BLOOD RIGHT HAND  Result Value Ref Range Status   Specimen Description BLOOD RIGHT HAND  Final   Special Requests   Final    BOTTLES DRAWN AEROBIC AND ANAEROBIC Blood Culture adequate volume   Culture   Final    NO GROWTH 2  DAYS Performed at [facility], [address] [place], [place] [address]    Report Status PENDING  Incomplete  Culture, blood (routine x 2)     Status: None (Preliminary result)   Collection Time: 12/04/19  7:30 PM   Specimen: BLOOD  Result Value Ref Range Status   Specimen Description BLOOD RIGHT ANTECUBITAL  Final   Special Requests   Final    BOTTLES DRAWN AEROBIC AND ANAEROBIC Blood Culture adequate volume   Culture   Final    NO GROWTH 2 DAYS Performed at [facility], [place] [address], [place], [place] [address]    Report Status PENDING  Incomplete  SARS Coronavirus 2 by RT PCR (hospital order, performed in [facility] hospital lab) Nasopharyngeal Nasopharyngeal Swab     Status: None   Collection Time: 12/04/19  8:50 PM   Specimen: Nasopharyngeal Swab  Result Value Ref Range Status   SARS Coronavirus 2 NEGATIVE NEGATIVE Final    Comment: (NOTE) SARS-CoV-2 target nucleic acids are NOT DETECTED.  The SARS-CoV-2 RNA is generally detectable in upper and lower respiratory specimens during the acute phase of infection. The lowest concentration of SARS-CoV-2 viral copies this assay can detect is 250 copies / mL. A negative result does not preclude SARS-CoV-2 infection and should not be used as the sole basis for treatment or other patient management decisions.  A negative result may occur with improper specimen collection / handling, submission of specimen other than nasopharyngeal swab, presence of viral mutation(s) within the areas targeted by this assay, and inadequate number of viral copies (<250 copies / mL). A negative result must be combined with clinical observations, patient history, and epidemiological information.  Fact Sheet for Patients:   StrictlyIdeas.no  Fact Sheet for Healthcare Providers: BankingDealers.co.za  This test is not yet approved or  cleared by the [place] FDA and has been authorized  for detection and/or diagnosis of SARS-CoV-2 by FDA under an Emergency Use Authorization (EUA).  This EUA will remain in effect (meaning this test can be used) for the duration of the COVID-19 declaration under Section 564(b)(1) of the Act, 21 U.S.C. section 360bbb-3(b)(1), unless the authorization is terminated or revoked sooner.  Performed at [facility], [place] [address] [place], [place] [address]       Radiology Studies: No results found.  Scheduled Meds:  vancomycin       bictegravir-emtricitabine-tenofovir AF  1 tablet Oral Daily   heparin  5,000 Units Subcutaneous Q8H   insulin aspart  0-9 Units Subcutaneous TID WC   insulin NPH Human  10 Units Subcutaneous BID AC & HS   Continuous Infusions:  vancomycin       LOS: 1 day   Time spent: 30 minutes   Darliss Cheney, MD [facility]  12/06/2019, 10:49 AM   To contact the attending provider between 7A-7P or the covering provider during after hours 7P-7A, please log into the web site [email or web address].

## 2019-12-06 NOTE — Procedures (Signed)
I was present at this dialysis session. I have reviewed the session itself and made appropriate changes.   3K, 1.5L UF goal. No c/o at this tme.    RCID following for MRSA bacteremia.    Hb stable. K 4.4.  P 7.   Filed Weights   12/04/19 2235 12/06/19 0800  Weight: 52.9 kg 51.2 kg    Recent Labs  Lab 12/06/19 0759  NA 134*  K 4.4  CL 96*  CO2 21*  GLUCOSE 183*  BUN 49*  CREATININE 11.85*  CALCIUM 9.3  PHOS 7.0*    Recent Labs  Lab 12/02/19 0343 12/03/19 0448 12/04/19 1521 12/05/19 0042 12/06/19 0230  WBC 10.2   < > 10.3 9.1 8.0  NEUTROABS 7.2  --   --   --  4.6  HGB 11.9*   < > 10.9* 10.9* 11.1*  HCT 35.5*   < > 33.9* 32.6* 32.9*  MCV 102.9*   < > 102.7* 101.2* 100.0  PLT 173   < > 208 219 240   < > = values in this interval not displayed.    Scheduled Meds: . bictegravir-emtricitabine-tenofovir AF  1 tablet Oral Daily  . heparin  5,000 Units Subcutaneous Q8H  . insulin aspart  0-9 Units Subcutaneous TID WC  . insulin NPH Human  10 Units Subcutaneous BID AC & HS   Continuous Infusions: . vancomycin     PRN Meds:.acetaminophen **OR** acetaminophen, ondansetron **OR** ondansetron (ZOFRAN) IV   Pearson Grippe  MD 12/06/2019, 9:30 AM

## 2019-12-06 NOTE — Plan of Care (Signed)
  Problem: Nutrition: Goal: Adequate nutrition will be maintained Outcome: Progressing   

## 2019-12-06 NOTE — Progress Notes (Signed)
Echocardiogram 2D Echocardiogram has been performed.  Alvin Daniels 12/06/2019, 3:19 PM   Patient is wondering about a pain he is having on the right side of his chest (near nipple area), said he has never had any pain there before and it's not very bad but he's wondering why he's having it now.

## 2019-12-07 ENCOUNTER — Encounter (HOSPITAL_COMMUNITY): Payer: Self-pay | Admitting: Family Medicine

## 2019-12-07 LAB — HIV-1 RNA QUANT-NO REFLEX-BLD
HIV 1 RNA Quant: <20 copies/mL
LOG10 HIV-1 RNA: UNDETERMINED log10copy/mL

## 2019-12-07 LAB — GLUCOSE, CAPILLARY
Glucose-Capillary: 122 mg/dL — ABNORMAL HIGH (ref 70–99)
Glucose-Capillary: 182 mg/dL — ABNORMAL HIGH (ref 70–99)
Glucose-Capillary: 145 mg/dL — ABNORMAL HIGH (ref 70–99)
Glucose-Capillary: 219 mg/dL — ABNORMAL HIGH (ref 70–99)

## 2019-12-07 LAB — CULTURE, BLOOD (ROUTINE X 2)
Culture: NO GROWTH
Special Requests: ADEQUATE

## 2019-12-07 MED ORDER — INSULIN NPH (HUMAN) (ISOPHANE) 100 UNIT/ML ~~LOC~~ SUSP
12.0000 [IU] | Freq: Two times a day (BID) | SUBCUTANEOUS | Status: DC
  Administered 2019-12-07 – 2019-12-10 (×5): 12 [IU] via SUBCUTANEOUS
  Filled 2019-12-07: qty 10

## 2019-12-07 MED ORDER — CHLORHEXIDINE GLUCONATE CLOTH 2 % EX PADS: 6.0000 | MEDICATED_PAD | Freq: Every day | CUTANEOUS | Status: DC

## 2019-12-07 NOTE — Plan of Care (Signed)
  Problem: Health Behavior/Discharge Planning: Goal: Ability to manage health-related needs will improve Outcome: Completed/Met   Problem: Clinical Measurements: Goal: Ability to maintain clinical measurements within normal limits will improve Outcome: Completed/Met   Problem: Safety: Goal: Ability to remain free from injury will improve Outcome: Completed/Met   Problem: Health Behavior/Discharge Planning: Goal: Ability to manage health-related needs will improve Outcome: Completed/Met   Problem: Nutritional: Goal: Ability to make healthy dietary choices will improve Outcome: Completed/Met   Problem: Clinical Measurements: Goal: Complications related to the disease process, condition or treatment will be avoided or minimized Outcome: Completed/Met   Problem: Education: Goal: Knowledge of disease and its progression will improve Outcome: Completed/Met   Problem: Fluid Volume: Goal: Compliance with measures to maintain balanced fluid volume will improve Outcome: Completed/Met   Problem: Health Behavior/Discharge Planning: Goal: Ability to manage health-related needs will improve Outcome: Completed/Met   Problem: Nutritional: Goal: Ability to make healthy dietary choices will improve Outcome: Completed/Met   Problem: Clinical Measurements: Goal: Complications related to the disease process, condition or treatment will be avoided or minimized Outcome: Completed/Met

## 2019-12-07 NOTE — Progress Notes (Signed)
[name] Progress Note   Subjective:   Patient seen and examined at bedside with tele-interpreter. Concerned about how long he has been here and wants to go back to work soon.  Agreed to stay for TEE and HD tomorrow with plans to d/c after.  Denies SOB, CP, n/v/d, abdominal pain, and stump pain.    Objective Vitals:   12/06/19 1648 12/06/19 2124 12/07/19 0446 12/07/19 0915  BP: (!) 160/121 (!) 84/53 (!) 94/60 (!) 96/61  Pulse: 80 83 84 82  Resp: 16 14 16 18   Temp: 98.7 F (37.1 C) 98.8 F (37.1 C) 98 F (36.7 C) 98 F (36.7 C)  TempSrc: Oral Oral Oral Oral  SpO2: 99% 96% 100% 100%  Weight:       Physical Exam General:chronically ill appearing male in NAD Heart:RRR, no mrg Lungs:CTAB Abdomen:soft, NTND Extremities: b/l BKA, no LE edema, ulcer on lateral aspect of R BKA Dialysis Access: LU AVF +B   Filed Weights   12/04/19 2235 12/06/19 0800 12/06/19 1207  Weight: 52.9 kg 51.2 kg 50.1 kg    Intake/Output Summary (Last 24 hours) at 12/07/2019 0945 Last data filed at 12/07/2019 0900 Gross per 24 hour  Intake 510 ml  Output 1058 ml  Net -548 ml    Additional Objective Labs: Basic Metabolic Panel: Recent Labs  Lab 12/04/19 1521 12/05/19 0042 12/06/19 0759  NA 134* 134* 134*  K 3.7 3.8 4.4  CL 93* 95* 96*  CO2 26 22 21*  GLUCOSE 189* 245* 183*  BUN 27* 31* 49*  CREATININE 6.96* 8.26* 11.85*  CALCIUM 9.6 9.3 9.3  PHOS  --   --  7.0*   Liver Function Tests: Recent Labs  Lab 12/06/19 0759  ALBUMIN 2.5*   CBC: Recent Labs  Lab 12/02/19 0343 12/02/19 0343 12/03/19 0448 12/03/19 0448 12/04/19 1521 12/05/19 0042 12/06/19 0230  WBC 10.2   < > 9.6   < > 10.3 9.1 8.0  NEUTROABS 7.2  --   --   --   --   --  4.6  HGB 11.9*   < > 11.2*   < > 10.9* 10.9* 11.1*  HCT 35.5*   < > 33.4*   < > 33.9* 32.6* 32.9*  MCV 102.9*  --  101.8*  --  102.7* 101.2* 100.0  PLT 173   < > 172   < > 208 219 240   < > = values in this interval not displayed.    Blood Culture    Component Value Date/Time   SDES BLOOD RIGHT ANTECUBITAL 12/04/2019 1930   SPECREQUEST  12/04/2019 1930    BOTTLES DRAWN AEROBIC AND ANAEROBIC Blood Culture adequate volume   CULT  12/04/2019 1930    NO GROWTH 2 DAYS Performed at [facility], [place] [address] [place], [place] [address]    REPTSTATUS PENDING 12/04/2019 1930    CBG: Recent Labs  Lab 12/06/19 0636 12/06/19 1319 12/06/19 1648 12/06/19 2124 12/07/19 0641  GLUCAP 176* 113* 230* 154* 122*   Studies/Results: ECHOCARDIOGRAM LIMITED  Result Date: 12/06/2019    ECHOCARDIOGRAM LIMITED REPORT   Patient Name:   VASIL JUHASZ Date of Exam: 12/06/2019 Medical Rec #:  [id number]              Height:       60.0 in Accession #:    [id number]             Weight:       110.4 lb  Date of Birth:  1970/06/01               BSA:          1.450 m Patient Age:    69 years               BP:           87/69 mmHg Patient Gender: M                      HR:           89 bpm. Exam Location:  Inpatient Procedure: 2D Echo and Color Doppler Indications:    Bacteremia R78.81  History:        Patient has prior history of Echocardiogram examinations, most                 recent 09/01/2019. Risk Factors:Diabetes.  Sonographer:    Raquel Sarna Senior RDCS Referring Phys: [id number] RAVI PAHWANI IMPRESSIONS  1. Left ventricular ejection fraction, by estimation, is 55 to 60%. The left ventricle has normal function.  2. Right ventricular systolic function is normal. The right ventricular size is normal. Tricuspid regurgitation signal is inadequate for assessing PA pressure.  3. The mitral valve is grossly normal. No evidence of mitral valve regurgitation. No evidence of mitral stenosis.  4. The aortic valve is tricuspid. Aortic valve regurgitation is not visualized. No aortic stenosis is present.  5. The inferior vena cava is normal in size with greater than 50% respiratory variability, suggesting right atrial pressure of 3 mmHg. Comparison(s): No  significant change from prior study. Conclusion(s)/Recommendation(s): No evidence of valvular vegetations on this transthoracic echocardiogram. Would recommend a transesophageal echocardiogram to exclude infective endocarditis if clinically indicated. FINDINGS  Left Ventricle: Left ventricular ejection fraction, by estimation, is 55 to 60%. The left ventricle has normal function. Right Ventricle: The right ventricular size is normal. No increase in right ventricular wall thickness. Right ventricular systolic function is normal. Tricuspid regurgitation signal is inadequate for assessing PA pressure. Pericardium: Trivial pericardial effusion is present. Mitral Valve: The mitral valve is grossly normal. No evidence of mitral valve stenosis. There is no evidence of mitral valve vegetation. Tricuspid Valve: The tricuspid valve is grossly normal. Tricuspid valve regurgitation is not demonstrated. No evidence of tricuspid stenosis. There is no evidence of tricuspid valve vegetation. Aortic Valve: The aortic valve is tricuspid. Aortic valve regurgitation is not visualized. No aortic stenosis is present. There is no evidence of aortic valve vegetation. Pulmonic Valve: The pulmonic valve was grossly normal. Pulmonic valve regurgitation is not visualized. No evidence of pulmonic stenosis. There is no evidence of pulmonic valve vegetation. Venous: The inferior vena cava is normal in size with greater than 50% respiratory variability, suggesting right atrial pressure of 3 mmHg. Eleonore Chiquito MD Electronically signed by Eleonore Chiquito MD Signature Date/Time: 12/06/2019/5:22:00 PM    Final     Medications: . vancomycin     . bictegravir-emtricitabine-tenofovir AF  1 tablet Oral Daily  . heparin  5,000 Units Subcutaneous Q8H  . insulin aspart  0-9 Units Subcutaneous TID WC  . insulin NPH Human  10 Units Subcutaneous BID AC & HS    Dialysis Orders: MWF NW 4h 50.0 kg  edw  400/ 2/2 bath LUE AVF Hep 2000  hect  92mcg    Holding  Mircera 60 last on   10/27/19  With hgb 11.3  On 12/01/19   Assessment/Plan: 1. MRSA bacteremia - recurrent bacteremia. Etiology  unclear. ID following. Plan for Vanc with HD. Plan for TEE tomorrow. To have Vanc 2-6 wks pending TEE.    2. ESRD - on HD MWF.  Orders written for HD tomorrow per regular schedule.  3. Anemia of CKD- Hgb 11.1. No indication for ESA at this time.  4. Secondary hyperparathyroidism - CCa 10.5, decrease VDRA, use 2Ca bath. Phos high.  Continue binders.  5. Hypotension/volume - Chronic hypotension, asymptomatic.  Not on midodrine due to cost. Has tolerated as OP to run on HD with SBP 70s per Dr. Lorrene Reid standing order. Does not appear volume overloaded. 6. Nutrition - Renal diet w/fluid restrictions.  7. R BKA cellulitis - recent admit 7/22-7/23. D/c on Keflex.  Per surgery did not require surgery. On Vanc 8. DMT2 per admit 9. HIV - per ID  Jen Mow, PA-C [facility] 12/07/2019,9:45 AM  LOS: 2 days

## 2019-12-07 NOTE — Plan of Care (Signed)
  Problem: Nutritional: Goal: Ability to make healthy dietary choices will improve Outcome: Progressing   Problem: Fluid Volume: Goal: Compliance with measures to maintain balanced fluid volume will improve Outcome: Progressing

## 2019-12-07 NOTE — Progress Notes (Signed)
Subjective: Alvin Daniels is worried about time missed from work and when Alvin Daniels is going to leave the hospital to be able to pay his bills  Antibiotics:  Anti-infectives (From admission, onward)   Start     Dose/Rate Route Frequency Ordered Stop   12/06/19 1200  vancomycin (VANCOREADY) IVPB 500 mg/100 mL     Discontinue     500 mg 100 mL/hr over 60 Minutes Intravenous Every M-W-F (Hemodialysis) 12/04/19 1912     12/06/19 1033  vancomycin (VANCOCIN) 500-5 MG/100ML-% IVPB       Note to Pharmacy: Murriel Hopper   : cabinet override      12/06/19 1033 12/06/19 1246   12/05/19 1315  linezolid (ZYVOX) tablet 600 mg  Status:  Discontinued        600 mg Oral Every 12 hours 12/05/19 1231 12/05/19 1336   12/05/19 1000  bictegravir-emtricitabine-tenofovir AF (BIKTARVY) 50-200-25 MG per tablet 1 tablet     Discontinue     1 tablet Oral Daily 12/04/19 2040        Medications: Scheduled Meds: . bictegravir-emtricitabine-tenofovir AF  1 tablet Oral Daily  . Chlorhexidine Gluconate Cloth  6 each Topical Q0600  . heparin  5,000 Units Subcutaneous Q8H  . insulin aspart  0-9 Units Subcutaneous TID WC  . insulin NPH Human  12 Units Subcutaneous BID AC & HS   Continuous Infusions: . vancomycin     PRN Meds:.acetaminophen **OR** acetaminophen, ondansetron **OR** ondansetron (ZOFRAN) IV    Objective: Weight change:   Intake/Output Summary (Last 24 hours) at 12/07/2019 1800 Last data filed at 12/07/2019 1325 Gross per 24 hour  Intake 480 ml  Output 0 ml  Net 480 ml   Blood pressure (!) 96/61, pulse 82, temperature 98 F (36.7 C), temperature source Oral, resp. rate 18, weight 50.1 kg, SpO2 100 %. Temp:  [98 F (36.7 C)-98.8 F (37.1 C)] 98 F (36.7 C) (07/27 0915) Pulse Rate:  [82-84] 82 (07/27 0915) Resp:  [14-18] 18 (07/27 0915) BP: (84-96)/(53-61) 96/61 (07/27 0915) SpO2:  [96 %-100 %] 100 % (07/27 0915)  Physical Exam: General: Alert and awake, oriented x3, not in any acute  distress. 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 C-Reactive Protein No results for input(s): CRP in the last 72 hours.  Micro Results: Recent Results (from the past 720 hour(s))  Blood culture (routine x 2)     Status: None   Collection Time: 12/02/19  3:43 AM   Specimen: BLOOD RIGHT FOREARM  Result Value Ref Range Status   Specimen Description   Final    BLOOD RIGHT FOREARM Performed at [facility], [address], [place], [place] [address]    Special Requests   Final    BOTTLES DRAWN AEROBIC AND ANAEROBIC Blood Culture adequate volume Performed at [facility] [address] [place],  [place] [address]    Culture   Final    NO GROWTH 5 DAYS Performed at [facility], [place] [address] [place], [place] [address]    Report Status 12/07/2019 FINAL  Final  Blood culture (routine x 2)     Status: Abnormal   Collection Time: 12/02/19  3:44 AM   Specimen: BLOOD LEFT HAND  Result Value Ref Range Status   Specimen Description   Final    BLOOD LEFT HAND Performed at [facility] [address], [place], [place] [address]    Special Requests   Final    BOTTLES DRAWN AEROBIC AND ANAEROBIC Blood Culture adequate volume Performed at [facility] [address] [place], [place] [address]    Culture  Setup Time   Final    GRAM POSITIVE COCCI IN CLUSTERS ANAEROBIC BOTTLE ONLY CRITICAL RESULT CALLED TO, READ BACK BY AND  VERIFIED WITH: DR Doristine [place] RAVI [id number] AT 299 AM BY CM Performed at [facility], [place] [address], [place], [place] [address]    Culture METHICILLIN RESISTANT STAPHYLOCOCCUS AUREUS (A)  Final   Report Status 12/05/2019 FINAL  Final   Organism ID, Bacteria METHICILLIN RESISTANT STAPHYLOCOCCUS AUREUS  Final      Susceptibility   Methicillin resistant staphylococcus aureus - MIC*    CIPROFLOXACIN <=0.5 SENSITIVE Sensitive     ERYTHROMYCIN >=8 RESISTANT Resistant     GENTAMICIN <=0.5 SENSITIVE Sensitive     OXACILLIN >=4 RESISTANT Resistant     TETRACYCLINE >=16 RESISTANT Resistant     VANCOMYCIN <=0.5 SENSITIVE Sensitive     TRIMETH/SULFA <=10 SENSITIVE Sensitive     CLINDAMYCIN <=0.25 SENSITIVE Sensitive     RIFAMPIN <=0.5 SENSITIVE Sensitive     Inducible Clindamycin NEGATIVE Sensitive     * METHICILLIN RESISTANT STAPHYLOCOCCUS AUREUS  Blood Culture ID Panel (Reflexed)     Status: Abnormal   Collection Time: 12/02/19  3:44 AM  Result Value Ref Range Status   Enterococcus species NOT DETECTED NOT DETECTED Final   Listeria monocytogenes NOT DETECTED NOT DETECTED Final   Staphylococcus species DETECTED (A) NOT DETECTED Final    Comment: CRITICAL RESULT CALLED TO, READ BACK BY AND VERIFIED WITH: DR Doristine [place] R [id number] AT 835 AM BY CM    Staphylococcus aureus (BCID) DETECTED (A) NOT DETECTED Final    Comment: Methicillin (oxacillin)-resistant Staphylococcus aureus (MRSA). MRSA is predictably resistant to beta-lactam antibiotics (except ceftaroline). Preferred therapy is vancomycin unless clinically contraindicated. Patient requires contact precautions if  hospitalized. CRITICAL RESULT CALLED TO, READ BACK BY AND VERIFIED WITH: DR Loann Quill [id number] AT 835 BY CM    Methicillin resistance DETECTED (A) NOT DETECTED Final    Comment: CRITICAL RESULT CALLED TO, READ BACK BY AND VERIFIED WITH: DR Doristine [place] R [id number] AT 835 AM BY CM    Streptococcus species NOT DETECTED NOT DETECTED Final    Streptococcus agalactiae NOT DETECTED NOT DETECTED Final   Streptococcus pneumoniae NOT DETECTED NOT DETECTED Final   Streptococcus pyogenes NOT DETECTED NOT DETECTED Final   Acinetobacter baumannii NOT DETECTED NOT DETECTED Final   Enterobacteriaceae species NOT DETECTED NOT DETECTED Final   Enterobacter cloacae complex NOT DETECTED NOT DETECTED Final   Escherichia coli NOT DETECTED NOT DETECTED Final   Klebsiella oxytoca NOT DETECTED NOT DETECTED Final   Klebsiella pneumoniae NOT DETECTED NOT DETECTED Final   Proteus species NOT DETECTED NOT DETECTED Final   Serratia marcescens NOT DETECTED NOT DETECTED Final   Haemophilus influenzae  NOT DETECTED NOT DETECTED Final   Neisseria meningitidis NOT DETECTED NOT DETECTED Final   Pseudomonas aeruginosa NOT DETECTED NOT DETECTED Final   Candida albicans NOT DETECTED NOT DETECTED Final   Candida glabrata NOT DETECTED NOT DETECTED Final   Candida krusei NOT DETECTED NOT DETECTED Final   Candida parapsilosis NOT DETECTED NOT DETECTED Final   Candida tropicalis NOT DETECTED NOT DETECTED Final    Comment: Performed at [facility], [place] [address] [place], [place] [address]  SARS Coronavirus 2 by RT PCR (hospital order, performed in [facility] hospital lab) Nasopharyngeal Nasopharyngeal Swab     Status: None   Collection Time: 12/02/19  6:53 AM   Specimen: Nasopharyngeal Swab  Result Value Ref Range Status   SARS Coronavirus 2 NEGATIVE NEGATIVE Final    Comment: (NOTE) SARS-CoV-2 target nucleic acids are NOT DETECTED.  The SARS-CoV-2 RNA is generally detectable in upper and lower respiratory specimens during the acute phase of infection. The lowest concentration of SARS-CoV-2 viral copies this assay can detect is 250 copies / mL. A negative result does not preclude SARS-CoV-2 infection and should not be used as the sole basis for treatment or other patient management decisions.  A negative result may occur with improper specimen  collection / handling, submission of specimen other than nasopharyngeal swab, presence of viral mutation(s) within the areas targeted by this assay, and inadequate number of viral copies (<250 copies / mL). A negative result must be combined with clinical observations, patient history, and epidemiological information.  Fact Sheet for Patients:   StrictlyIdeas.no  Fact Sheet for Healthcare Providers: BankingDealers.co.za  This test is not yet approved or  cleared by the [place] FDA and has been authorized for detection and/or diagnosis of SARS-CoV-2 by FDA under an Emergency Use Authorization (EUA).  This EUA will remain in effect (meaning this test can be used) for the duration of the COVID-19 declaration under Section 564(b)(1) of the Act, 21 U.S.C. section 360bbb-3(b)(1), unless the authorization is terminated or revoked sooner.  Performed at [facility], [place] [address] [place], [place] [address]   MRSA PCR Screening     Status: Abnormal   Collection Time: 12/02/19 10:57 PM   Specimen: Nasopharyngeal  Result Value Ref Range Status   MRSA by PCR POSITIVE (A) NEGATIVE Final    Comment:        The GeneXpert MRSA Assay (FDA approved for NASAL specimens only), is one component of a comprehensive MRSA colonization surveillance program. It is not intended to diagnose MRSA infection nor to guide or monitor treatment for MRSA infections. RESULT CALLED TO, READ BACK BY AND VERIFIED WITHPhil Dopp RN 12/03/19 0056 JDW Performed at [facility], [place] [address] [place], [place] [address]   Culture, blood (routine x 2)     Status: None (Preliminary result)   Collection Time: 12/04/19  7:25 PM   Specimen: BLOOD RIGHT HAND  Result Value Ref Range Status   Specimen Description BLOOD RIGHT HAND  Final   Special Requests   Final    BOTTLES DRAWN AEROBIC AND ANAEROBIC Blood Culture adequate volume    Culture   Final    NO GROWTH 3 DAYS Performed at [facility], [place] [address], [place], [place] [address]    Report Status PENDING  Incomplete  Culture, blood (routine x 2)     Status: None (Preliminary result)   Collection Time: 12/04/19  7:30 PM   Specimen: BLOOD  Result Value Ref Range Status   Specimen Description BLOOD RIGHT ANTECUBITAL  Final   Special Requests   Final    BOTTLES DRAWN AEROBIC AND ANAEROBIC Blood Culture adequate volume   Culture   Final    NO GROWTH 3 DAYS Performed at [facility], [address], [place], [place] [address]    Report Status PENDING  Incomplete  SARS Coronavirus 2 by RT PCR (hospital order, performed in [facility] hospital lab) Nasopharyngeal Nasopharyngeal Swab     Status: None   Collection Time: 12/04/19  8:50 PM   Specimen: Nasopharyngeal Swab  Result Value Ref Range Status   SARS Coronavirus 2 NEGATIVE NEGATIVE Final    Comment: (NOTE) SARS-CoV-2 target nucleic acids are NOT DETECTED.  The SARS-CoV-2 RNA is generally detectable in upper and lower respiratory specimens during the acute phase of infection. The lowest concentration of SARS-CoV-2 viral copies this assay can detect is 250 copies / mL. A negative result does not preclude SARS-CoV-2 infection and should not be used as the sole basis for treatment or other patient management decisions.  A negative result may occur with improper specimen collection / handling, submission of specimen other than nasopharyngeal swab, presence of viral mutation(s) within the areas targeted by this assay, and inadequate number of viral copies (<250 copies / mL). A negative result must be combined with clinical observations, patient history, and epidemiological information.  Fact Sheet for Patients:   StrictlyIdeas.no  Fact Sheet for Healthcare Providers: BankingDealers.co.za  This test is not yet approved or  cleared by the [place] FDA and has been authorized for detection and/or diagnosis of SARS-CoV-2 by FDA under an Emergency Use Authorization (EUA).  This EUA will remain in effect (meaning this test can be used) for the duration of the COVID-19 declaration under Section 564(b)(1) of the Act, 21 U.S.C. section 360bbb-3(b)(1), unless the authorization is terminated or revoked sooner.  Performed at [facility], [place] [address], [place], [place] [address]     Studies/Results: ECHOCARDIOGRAM LIMITED  Result Date: 12/06/2019    ECHOCARDIOGRAM LIMITED REPORT   Patient Name:   Alvin Daniels Date of Exam: 12/06/2019 Medical Rec #:  [id number]              Height:       60.0 in Accession #:    [id number]             Weight:       110.4 lb Date of Birth:  1970/10/04               BSA:          1.450 m Patient Age:    49 years               BP:           87/69 mmHg Patient Gender: M                      HR:           89 bpm. Exam Location:  Inpatient Procedure: 2D Echo and Color Doppler Indications:    Bacteremia R78.81  History:        Patient has prior history of Echocardiogram examinations, most                 recent 09/01/2019. Risk Factors:Diabetes.  Sonographer:    Raquel Sarna Senior RDCS Referring Phys: [id number] RAVI PAHWANI IMPRESSIONS  1. Left ventricular  ejection fraction, by estimation, is 55 to 60%. The left ventricle has normal function.  2. Right ventricular systolic function is normal. The right ventricular size is normal. Tricuspid regurgitation signal is inadequate for assessing PA pressure.  3. The mitral valve is grossly normal. No evidence of mitral valve regurgitation. No evidence of mitral stenosis.  4. The aortic valve is tricuspid. Aortic valve regurgitation is not visualized. No aortic stenosis is present.  5. The inferior vena cava is normal in size with greater than 50% respiratory variability, suggesting right atrial pressure of 3 mmHg. Comparison(s): No significant change from prior study.  Conclusion(s)/Recommendation(s): No evidence of valvular vegetations on this transthoracic echocardiogram. Would recommend a transesophageal echocardiogram to exclude infective endocarditis if clinically indicated. FINDINGS  Left Ventricle: Left ventricular ejection fraction, by estimation, is 55 to 60%. The left ventricle has normal function. Right Ventricle: The right ventricular size is normal. No increase in right ventricular wall thickness. Right ventricular systolic function is normal. Tricuspid regurgitation signal is inadequate for assessing PA pressure. Pericardium: Trivial pericardial effusion is present. Mitral Valve: The mitral valve is grossly normal. No evidence of mitral valve stenosis. There is no evidence of mitral valve vegetation. Tricuspid Valve: The tricuspid valve is grossly normal. Tricuspid valve regurgitation is not demonstrated. No evidence of tricuspid stenosis. There is no evidence of tricuspid valve vegetation. Aortic Valve: The aortic valve is tricuspid. Aortic valve regurgitation is not visualized. No aortic stenosis is present. There is no evidence of aortic valve vegetation. Pulmonic Valve: The pulmonic valve was grossly normal. Pulmonic valve regurgitation is not visualized. No evidence of pulmonic stenosis. There is no evidence of pulmonic valve vegetation. Venous: The inferior vena cava is normal in size with greater than 50% respiratory variability, suggesting right atrial pressure of 3 mmHg. Eleonore Chiquito MD Electronically signed by Eleonore Chiquito MD Signature Date/Time: 12/06/2019/5:22:00 PM    Final       Assessment/Plan:  INTERVAL HISTORY: TTE negative   Principal Problem:   MRSA bacteremia Active Problems:   Diabetes mellitus with ESRD (end-stage renal disease) ([place])   Cellulitis of right lower extremity   ESRD on hemodialysis ([place])   HIV disease ([place])   HIV (human immunodeficiency virus infection) ([place])   Bacteremia   Amputation stump infection  ([place])    Alvin Daniels is a 49 y.o. male with well-controlled HIV but comorbid end-stage renal disease on hemodialysis having been on dialysis for several years.  Alvin Daniels has had several Staph aureus bacteremia has some of which have been associated with severe infections and his lower extremities and Alvin Daniels undergone bilateral below the knee amputations.  Alvin Daniels was admitted with pain at his right BKA site and has some ulcerated lesions there that are currently not draining any material.  Alvin Daniels was treated with vancomycin while an inpatient and discharged on Keflex until one of his blood cultures came back positive for MRSA.  #1 MRSA bacteremia with multiple recurrences:  TTE negative, Alvin Daniels will get TEE in am  If TEE negative can treat him with completion of  2 weeks of vancomycin with HD or if it is positive Alvin Daniels gets 6 weeks  #2 HIV: continue Biktarvy  I spent greater than 435 minutes with the patient including greater than 50% of time in face to face counsel of the patient using telephonic translator re the nature of MRSA bacteremias and endocarditis.   LOS: 2 days   Alcide Evener 12/07/2019, 6:00 PM

## 2019-12-07 NOTE — Progress Notes (Signed)
PROGRESS NOTE    Lochlan Grygiel  MKL:[id number] DOB: [birth year] DOA: 12/04/2019 PCP: Patient, No Pcp Per   Brief Narrative:  Pao Haffey is a 49 y.o. male with medical history significant of DM2, HIV on HAART, ESRD on MWF dialysis (last on 23rd during hospital stay), BLE amputations. Pt was just admitted from 7/22-7/23 with cellulitis of his R BKA stump.  Pt was put on vancomycin during admission.  Cellulitis was significantly improved, Dr. Sharol Given felt it didn't need surgery, and pt discharged on 7/23 on Keflex as outpt per ID recommendations as his cellulitis was none to minimal and nonpurulent. However, since discharge yesterday, his one of the 2 bottles of BCx came back positive with MRSA.  Patient was called to come back to the ED for further evaluation.  Patient asymptomatic.   Assessment & Plan:   Principal Problem:   MRSA bacteremia Active Problems:   Diabetes mellitus with ESRD (end-stage renal disease) ([place])   Cellulitis of right lower extremity   ESRD on hemodialysis ([name])   HIV disease ([place])   HIV (human immunodeficiency virus infection) ([place])   Bacteremia   Amputation stump infection ([place])  MRSA bacteremia/right stump cellulitis: Source likely right stump cellulitis.  Patient is asymptomatic.  Remains on IV vancomycin per ID recommendations.  Transthoracic echo negative for any vegetations.  Scheduled to have TEE tomorrow.  ESRD: Nephrology on board.  Management per them.  Type 2 diabetes mellitus: Takes 15 units of Novolin and twice daily at home however here he is only on 7 twice daily along with SSI.  Still slightly hyperglycemic.  Increase Novolin to 12 units twice daily and continue SSI.  HIV: Follows ID clinic.  Management per ID.  DVT prophylaxis: heparin injection 5,000 Units Start: 12/04/19 2200   Code Status: Full Code  Family Communication: None present at bedside.  Plan of care discussed with patient in length and he verbalized understanding  and agreed with it.  Status is: Inpatient  Remains inpatient appropriate because:Inpatient level of care appropriate due to severity of illness   Dispo: The patient is from: Home              Anticipated d/c is to: Home              Anticipated d/c date is: 12/08/2019              Patient currently is not medically stable to d/c.        Estimated body mass index is 21.57 kg/m as calculated from the following:   Height as of 09/27/19: 5' (1.524 m).   Weight as of this encounter: 50.1 kg.      Nutritional status:               Consultants:   ID and nephrology  Procedures:   None  Antimicrobials:  Anti-infectives (From admission, onward)   Start     Dose/Rate Route Frequency Ordered Stop   12/06/19 1200  vancomycin (VANCOREADY) IVPB 500 mg/100 mL     Discontinue     500 mg 100 mL/hr over 60 Minutes Intravenous Every M-W-F (Hemodialysis) 12/04/19 1912     12/06/19 1033  vancomycin (VANCOCIN) 500-5 MG/100ML-% IVPB       Note to Pharmacy: Murriel Hopper   : cabinet override      12/06/19 1033 12/06/19 1246   12/05/19 1315  linezolid (ZYVOX) tablet 600 mg  Status:  Discontinued        600 mg Oral Every  12 hours 12/05/19 1231 12/05/19 1336   12/05/19 1000  bictegravir-emtricitabine-tenofovir AF (BIKTARVY) 50-200-25 MG per tablet 1 tablet     Discontinue     1 tablet Oral Daily 12/04/19 2040           Subjective: Seen and examined.  No complaints.  Objective: Vitals:   12/06/19 1648 12/06/19 2124 12/07/19 0446 12/07/19 0915  BP: (!) 160/121 (!) 84/53 (!) 94/60 (!) 96/61  Pulse: 80 83 84 82  Resp: 16 14 16 18   Temp: 98.7 F (37.1 C) 98.8 F (37.1 C) 98 F (36.7 C) 98 F (36.7 C)  TempSrc: Oral Oral Oral Oral  SpO2: 99% 96% 100% 100%  Weight:        Intake/Output Summary (Last 24 hours) at 12/07/2019 1229 Last data filed at 12/07/2019 0900 Gross per 24 hour  Intake 510 ml  Output 1 ml  Net 509 ml   Filed Weights   12/04/19 2235 12/06/19  0800 12/06/19 1207  Weight: 52.9 kg 51.2 kg 50.1 kg    Examination:  General exam: Appears calm and comfortable  Respiratory system: Clear to auscultation. Respiratory effort normal. Cardiovascular system: S1 & S2 heard, RRR. No JVD, murmurs, rubs, gallops or clicks. No pedal edema. Gastrointestinal system: Abdomen is nondistended, soft and nontender. No organomegaly or masses felt. Normal bowel sounds heard. Central nervous system: Alert and oriented. No focal neurological deficits. Extremities: Symmetric 5 x 5 power. Skin: Mild cellulitis and ulcer at right stump. Psychiatry: Judgement and insight appear normal. Mood & affect appropriate.          Data Reviewed: I have personally reviewed following labs and imaging studies  CBC: Recent Labs  Lab 12/02/19 0343 12/03/19 0448 12/04/19 1521 12/05/19 0042 12/06/19 0230  WBC 10.2 9.6 10.3 9.1 8.0  NEUTROABS 7.2  --   --   --  4.6  HGB 11.9* 11.2* 10.9* 10.9* 11.1*  HCT 35.5* 33.4* 33.9* 32.6* 32.9*  MCV 102.9* 101.8* 102.7* 101.2* 100.0  PLT 173 172 208 219 956   Basic Metabolic Panel: Recent Labs  Lab 12/02/19 0343 12/03/19 0448 12/04/19 1521 12/05/19 0042 12/06/19 0759  NA 139 136 134* 134* 134*  K 3.8 4.0 3.7 3.8 4.4  CL 94* 93* 93* 95* 96*  CO2 32 28 26 22  21*  GLUCOSE 192* 179* 189* 245* 183*  BUN 17 24* 27* 31* 49*  CREATININE 6.26* 9.06* 6.96* 8.26* 11.85*  CALCIUM 9.0 8.9 9.6 9.3 9.3  PHOS  --   --   --   --  7.0*   GFR: Estimated Creatinine Clearance: 5.3 mL/min (A) (by C-G formula based on SCr of 11.85 mg/dL (H)). Liver Function Tests: Recent Labs  Lab 12/06/19 0759  ALBUMIN 2.5*   No results for input(s): LIPASE, AMYLASE in the last 168 hours. No results for input(s): AMMONIA in the last 168 hours. Coagulation Profile: No results for input(s): INR, PROTIME in the last 168 hours. Cardiac Enzymes: No results for input(s): CKTOTAL, CKMB, CKMBINDEX, TROPONINI in the last 168 hours. BNP (last 3  results) No results for input(s): PROBNP in the last 8760 hours. HbA1C: No results for input(s): HGBA1C in the last 72 hours. CBG: Recent Labs  Lab 12/06/19 1319 12/06/19 1648 12/06/19 2124 12/07/19 0641 12/07/19 1203  GLUCAP 113* 230* 154* 122* 182*   Lipid Profile: No results for input(s): CHOL, HDL, LDLCALC, TRIG, CHOLHDL, LDLDIRECT in the last 72 hours. Thyroid Function Tests: No results for input(s): TSH, T4TOTAL, FREET4, T3FREE, THYROIDAB  in the last 72 hours. Anemia Panel: No results for input(s): VITAMINB12, FOLATE, FERRITIN, TIBC, IRON, RETICCTPCT in the last 72 hours. Sepsis Labs: Recent Labs  Lab 12/02/19 0344 12/04/19 1925  LATICACIDVEN 0.8 0.7    Recent Results (from the past 240 hour(s))  Blood culture (routine x 2)     Status: None (Preliminary result)   Collection Time: 12/02/19  3:43 AM   Specimen: BLOOD RIGHT FOREARM  Result Value Ref Range Status   Specimen Description   Final    BLOOD RIGHT FOREARM Performed at [facility] [address] [place], [place] [address]    Special Requests   Final    BOTTLES DRAWN AEROBIC AND ANAEROBIC Blood Culture adequate volume Performed at [facility] [address], [place], [place] [address]    Culture   Final    NO GROWTH 4 DAYS Performed at [facility], [place] [address], [place], [place] [address]    Report Status PENDING  Incomplete  Blood culture (routine x 2)     Status: Abnormal   Collection Time: 12/02/19  3:44 AM   Specimen: BLOOD LEFT HAND  Result Value Ref Range Status   Specimen Description   Final    BLOOD LEFT HAND Performed at [facility] [address], [place], [place] [address]    Special Requests   Final    BOTTLES DRAWN AEROBIC AND ANAEROBIC Blood Culture adequate volume Performed at [facility] [address], [place], [place] [address]    Culture  Setup Time   Final    GRAM POSITIVE COCCI IN  CLUSTERS ANAEROBIC BOTTLE ONLY CRITICAL RESULT CALLED TO, READ BACK BY AND VERIFIED WITH: DR Doristine Bosworth Marylu Dudenhoeffer [id number] AT 700 AM BY CM Performed at [facility], [place] [address] [place], [place] [address]    Culture METHICILLIN RESISTANT STAPHYLOCOCCUS AUREUS (A)  Final   Report Status 12/05/2019 FINAL  Final   Organism ID, Bacteria METHICILLIN RESISTANT STAPHYLOCOCCUS AUREUS  Final      Susceptibility   Methicillin resistant staphylococcus aureus - MIC*    CIPROFLOXACIN <=0.5 SENSITIVE Sensitive     ERYTHROMYCIN >=8 RESISTANT Resistant     GENTAMICIN <=0.5 SENSITIVE Sensitive     OXACILLIN >=4 RESISTANT Resistant     TETRACYCLINE >=16 RESISTANT Resistant     VANCOMYCIN <=0.5 SENSITIVE Sensitive     TRIMETH/SULFA <=10 SENSITIVE Sensitive     CLINDAMYCIN <=0.25 SENSITIVE Sensitive     RIFAMPIN <=0.5 SENSITIVE Sensitive     Inducible Clindamycin NEGATIVE Sensitive     * METHICILLIN RESISTANT STAPHYLOCOCCUS AUREUS  Blood Culture ID Panel (Reflexed)     Status: Abnormal   Collection Time: 12/02/19  3:44 AM  Result Value Ref Range Status   Enterococcus species NOT DETECTED NOT DETECTED Final   Listeria monocytogenes NOT DETECTED NOT DETECTED Final   Staphylococcus species DETECTED (A) NOT DETECTED Final    Comment: CRITICAL RESULT CALLED TO, READ BACK BY AND VERIFIED WITH: DR Doristine Bosworth R [id number] AT 835 AM BY CM    Staphylococcus aureus (BCID) DETECTED (A) NOT DETECTED Final    Comment: Methicillin (oxacillin)-resistant Staphylococcus aureus (MRSA). MRSA is predictably resistant to beta-lactam antibiotics (except ceftaroline). Preferred therapy is vancomycin unless clinically contraindicated. Patient requires contact precautions if  hospitalized. CRITICAL RESULT CALLED TO, READ BACK BY AND VERIFIED WITH: DR Loann Quill [id number] AT 835 BY CM    Methicillin resistance DETECTED (A) NOT DETECTED Final  Comment: CRITICAL RESULT CALLED TO, READ BACK BY AND VERIFIED WITH: DR Doristine Bosworth R [id number] AT  13 AM BY CM    Streptococcus species NOT DETECTED NOT DETECTED Final   Streptococcus agalactiae NOT DETECTED NOT DETECTED Final   Streptococcus pneumoniae NOT DETECTED NOT DETECTED Final   Streptococcus pyogenes NOT DETECTED NOT DETECTED Final   Acinetobacter baumannii NOT DETECTED NOT DETECTED Final   Enterobacteriaceae species NOT DETECTED NOT DETECTED Final   Enterobacter cloacae complex NOT DETECTED NOT DETECTED Final   Escherichia coli NOT DETECTED NOT DETECTED Final   Klebsiella oxytoca NOT DETECTED NOT DETECTED Final   Klebsiella pneumoniae NOT DETECTED NOT DETECTED Final   Proteus species NOT DETECTED NOT DETECTED Final   Serratia marcescens NOT DETECTED NOT DETECTED Final   Haemophilus influenzae NOT DETECTED NOT DETECTED Final   Neisseria meningitidis NOT DETECTED NOT DETECTED Final   Pseudomonas aeruginosa NOT DETECTED NOT DETECTED Final   Candida albicans NOT DETECTED NOT DETECTED Final   Candida glabrata NOT DETECTED NOT DETECTED Final   Candida krusei NOT DETECTED NOT DETECTED Final   Candida parapsilosis NOT DETECTED NOT DETECTED Final   Candida tropicalis NOT DETECTED NOT DETECTED Final    Comment: Performed at [facility], [place] [address], [place], [place] [address]  SARS Coronavirus 2 by RT PCR (hospital order, performed in [facility] hospital lab) Nasopharyngeal Nasopharyngeal Swab     Status: None   Collection Time: 12/02/19  6:53 AM   Specimen: Nasopharyngeal Swab  Result Value Ref Range Status   SARS Coronavirus 2 NEGATIVE NEGATIVE Final    Comment: (NOTE) SARS-CoV-2 target nucleic acids are NOT DETECTED.  The SARS-CoV-2 RNA is generally detectable in upper and lower respiratory specimens during the acute phase of infection. The lowest concentration of SARS-CoV-2 viral copies this assay can detect is 250 copies / mL. A negative result does not preclude SARS-CoV-2 infection and should not be used as the sole basis for treatment or other patient  management decisions.  A negative result may occur with improper specimen collection / handling, submission of specimen other than nasopharyngeal swab, presence of viral mutation(s) within the areas targeted by this assay, and inadequate number of viral copies (<250 copies / mL). A negative result must be combined with clinical observations, patient history, and epidemiological information.  Fact Sheet for Patients:   StrictlyIdeas.no  Fact Sheet for Healthcare Providers: BankingDealers.co.za  This test is not yet approved or  cleared by the [place] FDA and has been authorized for detection and/or diagnosis of SARS-CoV-2 by FDA under an Emergency Use Authorization (EUA).  This EUA will remain in effect (meaning this test can be used) for the duration of the COVID-19 declaration under Section 564(b)(1) of the Act, 21 U.S.C. section 360bbb-3(b)(1), unless the authorization is terminated or revoked sooner.  Performed at [facility], [place] [address], [place], [place] [address]   MRSA PCR Screening     Status: Abnormal   Collection Time: 12/02/19 10:57 PM   Specimen: Nasopharyngeal  Result Value Ref Range Status   MRSA by PCR POSITIVE (A) NEGATIVE Final    Comment:        The GeneXpert MRSA Assay (FDA approved for NASAL specimens only), is one component of a comprehensive MRSA colonization surveillance program. It is not intended to diagnose MRSA infection nor to guide or monitor treatment for MRSA infections. RESULT CALLED TO, READ BACK BY AND VERIFIED WITHPhil Dopp RN 12/03/19 0056 JDW Performed at [facility], [place] [address] [place], [place] [address]   Culture, blood (routine x 2)     Status: None (Preliminary result)   Collection Time: 12/04/19  7:25 PM   Specimen: BLOOD RIGHT HAND  Result Value Ref Range Status   Specimen Description BLOOD RIGHT HAND  Final   Special Requests   Final     BOTTLES DRAWN AEROBIC AND ANAEROBIC Blood Culture adequate volume   Culture   Final    NO GROWTH 2 DAYS Performed at [facility], [place] [address] [place], [place] [address]    Report Status PENDING  Incomplete  Culture, blood (routine x 2)     Status: None (Preliminary result)   Collection Time: 12/04/19  7:30 PM   Specimen: BLOOD  Result Value Ref Range Status   Specimen Description BLOOD RIGHT ANTECUBITAL  Final   Special Requests   Final    BOTTLES DRAWN AEROBIC AND ANAEROBIC Blood Culture adequate volume   Culture   Final    NO GROWTH 2 DAYS Performed at [facility], [place] [address] [place], [place] [address]    Report Status PENDING  Incomplete  SARS Coronavirus 2 by RT PCR (hospital order, performed in [facility] hospital lab) Nasopharyngeal Nasopharyngeal Swab     Status: None   Collection Time: 12/04/19  8:50 PM   Specimen: Nasopharyngeal Swab  Result Value Ref Range Status   SARS Coronavirus 2 NEGATIVE NEGATIVE Final    Comment: (NOTE) SARS-CoV-2 target nucleic acids are NOT DETECTED.  The SARS-CoV-2 RNA is generally detectable in upper and lower respiratory specimens during the acute phase of infection. The lowest concentration of SARS-CoV-2 viral copies this assay can detect is 250 copies / mL. A negative result does not preclude SARS-CoV-2 infection and should not be used as the sole basis for treatment or other patient management decisions.  A negative result may occur with improper specimen collection / handling, submission of specimen other than nasopharyngeal swab, presence of viral mutation(s) within the areas targeted by this assay, and inadequate number of viral copies (<250 copies / mL). A negative result must be combined with clinical observations, patient history, and epidemiological information.  Fact Sheet for Patients:   StrictlyIdeas.no  Fact Sheet for Healthcare  Providers: BankingDealers.co.za  This test is not yet approved or  cleared by the [place] FDA and has been authorized for detection and/or diagnosis of SARS-CoV-2 by FDA under an Emergency Use Authorization (EUA).  This EUA will remain in effect (meaning this test can be used) for the duration of the COVID-19 declaration under Section 564(b)(1) of the Act, 21 U.S.C. section 360bbb-3(b)(1), unless the authorization is terminated or revoked sooner.  Performed at [facility], [place] [address], [place], [place] [address]       Radiology Studies: ECHOCARDIOGRAM LIMITED  Result Date: 12/06/2019    ECHOCARDIOGRAM LIMITED REPORT   Patient Name:   LONGINO TREFZ Date of Exam: 12/06/2019 Medical Rec #:  [id number]              Height:       60.0 in Accession #:    [id number]             Weight:       110.4 lb Date of Birth:  1970/08/12               BSA:          1.450 m Patient Age:  49 years               BP:           87/69 mmHg Patient Gender: M                      HR:           89 bpm. Exam Location:  Inpatient Procedure: 2D Echo and Color Doppler Indications:    Bacteremia R78.81  History:        Patient has prior history of Echocardiogram examinations, most                 recent 09/01/2019. Risk Factors:Diabetes.  Sonographer:    Raquel Sarna Senior RDCS Referring Phys: [id number] [place] IMPRESSIONS  1. Left ventricular ejection fraction, by estimation, is 55 to 60%. The left ventricle has normal function.  2. Right ventricular systolic function is normal. The right ventricular size is normal. Tricuspid regurgitation signal is inadequate for assessing PA pressure.  3. The mitral valve is grossly normal. No evidence of mitral valve regurgitation. No evidence of mitral stenosis.  4. The aortic valve is tricuspid. Aortic valve regurgitation is not visualized. No aortic stenosis is present.  5. The inferior vena cava is normal in size with greater than 50% respiratory  variability, suggesting right atrial pressure of 3 mmHg. Comparison(s): No significant change from prior study. Conclusion(s)/Recommendation(s): No evidence of valvular vegetations on this transthoracic echocardiogram. Would recommend a transesophageal echocardiogram to exclude infective endocarditis if clinically indicated. FINDINGS  Left Ventricle: Left ventricular ejection fraction, by estimation, is 55 to 60%. The left ventricle has normal function. Right Ventricle: The right ventricular size is normal. No increase in right ventricular wall thickness. Right ventricular systolic function is normal. Tricuspid regurgitation signal is inadequate for assessing PA pressure. Pericardium: Trivial pericardial effusion is present. Mitral Valve: The mitral valve is grossly normal. No evidence of mitral valve stenosis. There is no evidence of mitral valve vegetation. Tricuspid Valve: The tricuspid valve is grossly normal. Tricuspid valve regurgitation is not demonstrated. No evidence of tricuspid stenosis. There is no evidence of tricuspid valve vegetation. Aortic Valve: The aortic valve is tricuspid. Aortic valve regurgitation is not visualized. No aortic stenosis is present. There is no evidence of aortic valve vegetation. Pulmonic Valve: The pulmonic valve was grossly normal. Pulmonic valve regurgitation is not visualized. No evidence of pulmonic stenosis. There is no evidence of pulmonic valve vegetation. Venous: The inferior vena cava is normal in size with greater than 50% respiratory variability, suggesting right atrial pressure of 3 mmHg. Eleonore Chiquito MD Electronically signed by Eleonore Chiquito MD Signature Date/Time: 12/06/2019/5:22:00 PM    Final     Scheduled Meds: . bictegravir-emtricitabine-tenofovir AF  1 tablet Oral Daily  . Chlorhexidine Gluconate Cloth  6 each Topical Q0600  . heparin  5,000 Units Subcutaneous Q8H  . insulin aspart  0-9 Units Subcutaneous TID WC  . insulin NPH Human  10 Units  Subcutaneous BID AC & HS   Continuous Infusions: . vancomycin       LOS: 2 days   Time spent: 28 minutes   Darliss Cheney, MD [facility]  12/07/2019, 12:29 PM   To contact the attending provider between 7A-7P or the covering provider during after hours 7P-7A, please log into the web site [email or web address].
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
## 2019-12-07 NOTE — Progress Notes (Signed)
    [facility] has been requested to perform a transesophageal echocardiogram on Alvin Daniels for bacteremia.  After careful review of history and examination, the risks and benefits of transesophageal echocardiogram have been explained through the use of [employer] Interpreter, interpreter # [phone] including risks of esophageal damage, perforation (1:10,000 risk), bleeding, pharyngeal hematoma as well as other potential complications associated with conscious sedation including aspiration, arrhythmia, respiratory failure and death. Alternatives to treatment were discussed, questions were answered. Patient is willing to proceed.   Kathyrn Drown, NP  12/07/2019 3:57 PM

## 2019-12-07 NOTE — Plan of Care (Signed)
  Problem: Clinical Measurements: Goal: Diagnostic test results will improve Outcome: Completed/Met Goal: Cardiovascular complication will be avoided Outcome: Completed/Met   Problem: Activity: Goal: Risk for activity intolerance will decrease Outcome: Completed/Met   Problem: Nutrition: Goal: Adequate nutrition will be maintained Outcome: Completed/Met   Problem: Coping: Goal: Level of anxiety will decrease Outcome: Completed/Met   Problem: Elimination: Goal: Will not experience complications related to bowel motility Outcome: Completed/Met Goal: Will not experience complications related to urinary retention Outcome: Completed/Met   Problem: Pain Managment: Goal: General experience of comfort will improve Outcome: Completed/Met   Problem: Skin Integrity: Goal: Risk for impaired skin integrity will decrease Outcome: Completed/Met   Problem: Education: Goal: Knowledge of disease and its progression will improve Outcome: Completed/Met   Problem: Fluid Volume: Goal: Compliance with measures to maintain balanced fluid volume will improve Outcome: Completed/Met

## 2019-12-07 NOTE — Progress Notes (Signed)
Renal Navigator spoke with [place] Worker to ask if patient is able to receive Vancomycin during OP HD, noting patient's ER Medicaid status. Per Producer, television/film/video, they are agreeable to patient returning to the clinic for IV Vancomycin during HD. Navigator informed CM/W. Estelle Grumbles and Pharmacy.  Alphonzo Cruise, [place] Renal Navigator [phone]

## 2019-12-08 ENCOUNTER — Other Ambulatory Visit (HOSPITAL_COMMUNITY): Payer: Self-pay

## 2019-12-08 ENCOUNTER — Encounter (HOSPITAL_COMMUNITY)
Admission: EM | Disposition: A | Discharge status: Home / Self Care | Payer: Self-pay | Source: Home / Self Care | Attending: Family Medicine

## 2019-12-08 LAB — BASIC METABOLIC PANEL
Anion gap: 18 — ABNORMAL HIGH (ref 5–15)
BUN: 43 mg/dL — ABNORMAL HIGH (ref 6–20)
CO2: 22 mmol/L (ref 22–32)
Calcium: 9.9 mg/dL (ref 8.9–10.3)
Chloride: 99 mmol/L (ref 98–111)
Creatinine, Ser: 10.15 mg/dL — ABNORMAL HIGH (ref 0.61–1.24)
GFR calc Af Amer: 6 mL/min — ABNORMAL LOW (ref >60)
GFR calc non Af Amer: 5 mL/min — ABNORMAL LOW (ref >60)
Glucose, Bld: 109 mg/dL — ABNORMAL HIGH (ref 70–99)
Potassium: 4.7 mmol/L (ref 3.5–5.1)
Sodium: 139 mmol/L (ref 135–145)

## 2019-12-08 LAB — CBC
HCT: 34.2 % — ABNORMAL LOW (ref 39.0–52.0)
Hemoglobin: 11.3 g/dL — ABNORMAL LOW (ref 13.0–17.0)
MCH: 33.7 pg (ref 26.0–34.0)
MCHC: 33 g/dL (ref 30.0–36.0)
MCV: 102.1 fL — ABNORMAL HIGH (ref 80.0–100.0)
Platelets: 294 10*3/uL (ref 150–400)
RBC: 3.35 MIL/uL — ABNORMAL LOW (ref 4.22–5.81)
RDW: 13.2 % (ref 11.5–15.5)
WBC: 7.7 10*3/uL (ref 4.0–10.5)
nRBC: 0 % (ref 0.0–0.2)

## 2019-12-08 LAB — GLUCOSE, CAPILLARY
Glucose-Capillary: 106 mg/dL — ABNORMAL HIGH (ref 70–99)
Glucose-Capillary: 84 mg/dL (ref 70–99)
Glucose-Capillary: 119 mg/dL — ABNORMAL HIGH (ref 70–99)
Glucose-Capillary: 167 mg/dL — ABNORMAL HIGH (ref 70–99)

## 2019-12-08 LAB — LACTIC ACID, PLASMA
Lactic Acid, Venous: 0.9 mmol/L (ref 0.5–1.9)
Lactic Acid, Venous: 2.1 mmol/L (ref 0.5–1.9)

## 2019-12-08 SURGERY — ECHOCARDIOGRAM, TRANSESOPHAGEAL
Anesthesia: Moderate Sedation

## 2019-12-08 MED ORDER — ACETAMINOPHEN 325 MG PO TABS
ORAL_TABLET | ORAL | Status: AC
  Filled 2019-12-08: qty 2

## 2019-12-08 MED ORDER — VANCOMYCIN HCL IN DEXTROSE 500-5 MG/100ML-% IV SOLN
INTRAVENOUS | Status: AC
  Administered 2019-12-08: 500 mg
  Filled 2019-12-08: qty 100

## 2019-12-08 MED ORDER — MIDODRINE HCL 5 MG PO TABS
ORAL_TABLET | ORAL | Status: AC
  Administered 2019-12-08: 10 mg
  Filled 2019-12-08: qty 2

## 2019-12-08 MED ORDER — MIDODRINE HCL 5 MG PO TABS
5.0000 mg | ORAL_TABLET | Freq: Three times a day (TID) | ORAL | Status: DC
  Administered 2019-12-08 – 2019-12-09 (×2): 5 mg via ORAL
  Filled 2019-12-08 (×3): qty 1

## 2019-12-08 NOTE — Progress Notes (Signed)
[facility] Progress Note   Subjective:   Patient seen and examined at bedside with tele interpretor service.  Procedure cancelled this AM due to hypotension.  He is now unsure if he wants to proceed with TTE.  States he feels fine and does not understand why the TEE can not be completed with his current BP.  Nurse and myself attempted to explain and he is not understanding. Requesting to speak with ID/primary.  Nurse notified.  Denied SOB, CP, n/v/d, weakness, dizziness and fatigue.    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 Dialysis Access: LU AVF +b   Filed Weights   12/06/19 0800 12/06/19 1207 12/07/19 2209  Weight: 51.2 kg 50.1 kg 51.7 kg    Intake/Output Summary (Last 24 hours) at 12/08/2019 1104 Last data filed at 12/08/2019 0800 Gross per 24 hour  Intake 720 ml  Output 0 ml  Net 720 ml    Additional Objective Labs: Basic Metabolic Panel: Recent Labs  Lab 12/04/19 1521 12/05/19 0042 12/06/19 0759  NA 134* 134* 134*  K 3.7 3.8 4.4  CL 93* 95* 96*  CO2 26 22 21*  GLUCOSE 189* 245* 183*  BUN 27* 31* 49*  CREATININE 6.96* 8.26* 11.85*  CALCIUM 9.6 9.3 9.3  PHOS  --   --  7.0*   Liver Function Tests: Recent Labs  Lab 12/06/19 0759  ALBUMIN 2.5*  CBC: Recent Labs  Lab 12/02/19 0343 12/02/19 0343 12/03/19 0448 12/03/19 0448 12/04/19 1521 12/05/19 0042 12/06/19 0230  WBC 10.2   < > 9.6   < > 10.3 9.1 8.0  NEUTROABS 7.2  --   --   --   --   --  4.6  HGB 11.9*   < > 11.2*   < > 10.9* 10.9* 11.1*   HCT 35.5*   < > 33.4*   < > 33.9* 32.6* 32.9*  MCV 102.9*  --  101.8*  --  102.7* 101.2* 100.0  PLT 173   < > 172   < > 208 219 240   < > = values in this interval not displayed.   CBG: Recent Labs  Lab 12/07/19 0641 12/07/19 1203 12/07/19 1612 12/07/19 2120 12/08/19 0640  GLUCAP 122* 182* 145* 219* 106*   Studies/Results: ECHOCARDIOGRAM LIMITED  Result Date: 12/06/2019    ECHOCARDIOGRAM LIMITED REPORT   Patient Name:   Alvin Daniels Date of Exam: 12/06/2019 Medical Rec #:  [id number]              Height:       60.0 in Accession #:    [id number]             Weight:       110.4 lb Date of Birth:  03-23-1971               BSA:          1.450 m Patient Age:    49 years  BP:           87/69 mmHg Patient Gender: M                      HR:           89 bpm. Exam Location:  Inpatient Procedure: 2D Echo and Color Doppler Indications:    Bacteremia R78.81  History:        Patient has prior history of Echocardiogram examinations, most                 recent 09/01/2019. Risk Factors:Diabetes.  Sonographer:    Raquel Sarna Senior RDCS Referring Phys: [id number] RAVI PAHWANI IMPRESSIONS  1. Left ventricular ejection fraction, by estimation, is 55 to 60%. The left ventricle has normal function.  2. Right ventricular systolic function is normal. The right ventricular size is normal. Tricuspid regurgitation signal is inadequate for assessing PA pressure.  3. The mitral valve is grossly normal. No evidence of mitral valve regurgitation. No evidence of mitral stenosis.  4. The aortic valve is tricuspid. Aortic valve regurgitation is not visualized. No aortic stenosis is present.  5. The inferior vena cava is normal in size with greater than 50% respiratory variability, suggesting right atrial pressure of 3 mmHg. Comparison(s): No significant change from prior study. Conclusion(s)/Recommendation(s): No evidence of valvular vegetations on this transthoracic echocardiogram. Would recommend a transesophageal  echocardiogram to exclude infective endocarditis if clinically indicated. FINDINGS  Left Ventricle: Left ventricular ejection fraction, by estimation, is 55 to 60%. The left ventricle has normal function. Right Ventricle: The right ventricular size is normal. No increase in right ventricular wall thickness. Right ventricular systolic function is normal. Tricuspid regurgitation signal is inadequate for assessing PA pressure. Pericardium: Trivial pericardial effusion is present. Mitral Valve: The mitral valve is grossly normal. No evidence of mitral valve stenosis. There is no evidence of mitral valve vegetation. Tricuspid Valve: The tricuspid valve is grossly normal. Tricuspid valve regurgitation is not demonstrated. No evidence of tricuspid stenosis. There is no evidence of tricuspid valve vegetation. Aortic Valve: The aortic valve is tricuspid. Aortic valve regurgitation is not visualized. No aortic stenosis is present. There is no evidence of aortic valve vegetation. Pulmonic Valve: The pulmonic valve was grossly normal. Pulmonic valve regurgitation is not visualized. No evidence of pulmonic stenosis. There is no evidence of pulmonic valve vegetation. Venous: The inferior vena cava is normal in size with greater than 50% respiratory variability, suggesting right atrial pressure of 3 mmHg. Eleonore Chiquito MD Electronically signed by Eleonore Chiquito MD Signature Date/Time: 12/06/2019/5:22:00 PM    Final     Medications: . vancomycin     . bictegravir-emtricitabine-tenofovir AF  1 tablet Oral Daily  . heparin  5,000 Units Subcutaneous Q8H  . insulin aspart  0-9 Units Subcutaneous TID WC  . insulin NPH Human  12 Units Subcutaneous BID AC & HS  . midodrine  5 mg Oral TID WC    Dialysis Orders: MWF NW 4h 50.0 kg edw 400/ 2/2 bath LUE AVF Hep 2000  hect19mcg  Holding Mircera 60 last on 10/27/19 With hgb 11.3 On 12/01/19   Assessment/Plan: 1. MRSA bacteremia - recurrent bacteremia. Etiology  unclear. ID following. Plan for Vanc with HD. TEE cancelled and rescheduled for tomorrow d/t hypotension. To have Vanc 2-6 wks pending TEE.   Per ID. 2. ESRD - on HD MWF.  HD today per regular schedule. K 4.4. 3. Anemia of CKD- Hgb 11.1. No indication for ESA at this  time.  4. Secondary hyperparathyroidism - CCa 10.5, VDRA decreased, use 2Ca bath. Phos high.  Continue binders. Rechecking labs today. 5. Hypotension/volume - Chronic hypotension, asymptomatic.  Not on midodrine due to cost. Has tolerated as OP to run on HD with SBP 70s per Dr. Lorrene Reid standing order. Does not appear volume overloaded. 6. Nutrition - Renal diet w/fluid restrictions.  7. R BKA cellulitis - recent admit 7/22-7/23. D/c on Keflex.  Per surgery did not require surgery. On Vanc 8. DMT2 per admit 9. HIV - per ID  Jen Mow, PA-C [facility] 12/08/2019,11:04 AM  LOS: 3 days

## 2019-12-08 NOTE — Progress Notes (Signed)
PROGRESS NOTE    Alvin Daniels  ELF:[id number] DOB: Apr 19, 1971 DOA: 12/04/2019 PCP: Alvin Daniels, No Pcp Per   Brief Narrative: 49 year old with past medical history significant for diabetes type 2, HIV on HAART, ESRD on M WF dialysis, bilateral below-knee amputation.  Alvin Daniels was just admitted from 7/20 20278/23 with cellulitis of his right BKA stump.  Alvin Daniels received vancomycin during admission.  His cellulitis was significantly improved, Dr. Sharol Given felt he did not need surgery.  Alvin Daniels was discharged on 7/23 on Keflex.  Since discharge 1 out of 2 bottles of blood cultures  came back with positive for MRSA.  Alvin Daniels was informed of the result and he presented for admission.    Assessment & Plan:   Principal Problem:   MRSA bacteremia Active Problems:   Diabetes mellitus with ESRD (end-stage renal disease) (Alvin Daniels)   Cellulitis of right lower extremity   ESRD on hemodialysis ([name])   HIV disease (Alvin Daniels)   HIV (human immunodeficiency virus infection) (Alvin Daniels)   Bacteremia   Amputation stump infection (Alvin Daniels)  1-MRSA bacteremia/right stump cellulitis: Continue with IV vancomycin per ID recommendation. Transthoracic echo negative for vegetation. Alvin Daniels was supposed to have TEE today, but due to low blood pressure test was canceled. Started on midodrine for hypotension.  Awaiting recommendation from cardiology for TEE ID recommendation if TEE is negative Alvin Daniels can receive 2 weeks of IV vancomycin with hemodialysis, if TEE is positive he will need 6 weeks of IV antibiotics  2-ESRD on hemodialysis For hemodialysis today  3-Diabetes type 2: Continue with Novolin.  4-HIV: Follow-up with ID.  Continue with Biktarvi.  5-Hypotension:  lactic acid normal, CBC, globin stable Start midodrine  Estimated body mass index is 22.26 kg/m as calculated from the following:   Height as of 09/27/19: 5' (1.524 m).   Weight as of this encounter: 51.7 kg.   DVT prophylaxis: Heparin Code Status: Full  code Family Communication: Discussed with Alvin Daniels Disposition Plan:  Status is: Inpatient  Remains inpatient appropriate because:Ongoing diagnostic testing needed not appropriate for outpatient work up   Dispo: The Alvin Daniels is from: Home              Anticipated d/c is to: Home              Anticipated d/c date is: 2 days              Alvin Daniels currently is not medically stable to d/c.  Waiting TEE        Consultants:   ID  Nephrology   Procedures:   HD  TEE  Antimicrobials:  Vancomycin   Subjective: He denies chest pain, dizziness  Objective: Vitals:   12/08/19 1221 12/08/19 1226 12/08/19 1235 12/08/19 1300  BP: (!) 76/34 (!) 85/39 (!) 83/38 91/72  Pulse: (!) 107 79 77 (!) 113  Resp: 16 15    Temp: 97.9 F (36.6 C)     TempSrc: Oral     SpO2: 99%     Weight: 51.7 kg       Intake/Output Summary (Last 24 hours) at 12/08/2019 1419 Last data filed at 12/08/2019 0800 Gross per 24 hour  Intake 600 ml  Output 0 ml  Net 600 ml   Filed Weights   12/06/19 1207 12/07/19 2209 12/08/19 1221  Weight: 50.1 kg 51.7 kg 51.7 kg    Examination:  General exam: Appears calm and comfortable  Respiratory system: Clear to auscultation. Respiratory effort normal. Cardiovascular system: S1 & S2 heard, RRR. No JVD, murmurs, rubs,  gallops or clicks. No pedal edema. Gastrointestinal system: Abdomen is nondistended, soft and nontender. No organomegaly or masses felt. Normal bowel sounds heard. Central nervous system: Alert and oriented. No focal neurological deficits. Extremities: BL BKA   Data Reviewed: I have personally reviewed following labs and imaging studies  CBC: Recent Labs  Lab 12/02/19 0343 12/02/19 0343 12/03/19 0448 12/04/19 1521 12/05/19 0042 12/06/19 0230 12/08/19 1102  WBC 10.2   < > 9.6 10.3 9.1 8.0 7.7  NEUTROABS 7.2  --   --   --   --  4.6  --   HGB 11.9*   < > 11.2* 10.9* 10.9* 11.1* 11.3*  HCT 35.5*   < > 33.4* 33.9* 32.6* 32.9* 34.2*  MCV  102.9*   < > 101.8* 102.7* 101.2* 100.0 102.1*  PLT 173   < > 172 208 219 240 294   < > = values in this interval not displayed.   Basic Metabolic Panel: Recent Labs  Lab 12/03/19 0448 12/04/19 1521 12/05/19 0042 12/06/19 0759 12/08/19 1102  NA 136 134* 134* 134* 139  K 4.0 3.7 3.8 4.4 4.7  CL 93* 93* 95* 96* 99  CO2 28 26 22  21* 22  GLUCOSE 179* 189* 245* 183* 109*  BUN 24* 27* 31* 49* 43*  CREATININE 9.06* 6.96* 8.26* 11.85* 10.15*  CALCIUM 8.9 9.6 9.3 9.3 9.9  PHOS  --   --   --  7.0*  --    GFR: Estimated Creatinine Clearance: 6.2 mL/min (A) (by C-G formula based on SCr of 10.15 mg/dL (H)). Liver Function Tests: Recent Labs  Lab 12/06/19 0759  ALBUMIN 2.5*   No results for input(s): LIPASE, AMYLASE in the last 168 hours. No results for input(s): AMMONIA in the last 168 hours. Coagulation Profile: No results for input(s): INR, PROTIME in the last 168 hours. Cardiac Enzymes: No results for input(s): CKTOTAL, CKMB, CKMBINDEX, TROPONINI in the last 168 hours. BNP (last 3 results) No results for input(s): PROBNP in the last 8760 hours. HbA1C: No results for input(s): HGBA1C in the last 72 hours. CBG: Recent Labs  Lab 12/07/19 1203 12/07/19 1612 12/07/19 2120 12/08/19 0640 12/08/19 1130  GLUCAP 182* 145* 219* 106* 84   Lipid Profile: No results for input(s): CHOL, HDL, LDLCALC, TRIG, CHOLHDL, LDLDIRECT in the last 72 hours. Thyroid Function Tests: No results for input(s): TSH, T4TOTAL, FREET4, T3FREE, THYROIDAB in the last 72 hours. Anemia Panel: No results for input(s): VITAMINB12, FOLATE, FERRITIN, TIBC, IRON, RETICCTPCT in the last 72 hours. Sepsis Labs: Recent Labs  Lab 12/02/19 0344 12/04/19 1925 12/08/19 1102  LATICACIDVEN 0.8 0.7 0.9    Recent Results (from the past 240 hour(s))  Blood culture (routine x 2)     Status: None   Collection Time: 12/02/19  3:43 AM   Specimen: BLOOD RIGHT FOREARM  Result Value Ref Range Status   Specimen  Description   Final    BLOOD RIGHT FOREARM Performed at [facility] [address] [place], [place] [address]    Special Requests   Final    BOTTLES DRAWN AEROBIC AND ANAEROBIC Blood Culture adequate volume Performed at [facility] [address], [place], [place] [address]    Culture   Final    NO GROWTH 5 DAYS Performed at [facility], [place] [address], [place],  [address]    Report Status 12/07/2019 FINAL  Final  Blood culture (routine x 2)     Status: Abnormal  Collection Time: 12/02/19  3:44 AM   Specimen: BLOOD LEFT HAND  Result Value Ref Range Status   Specimen Description   Final    BLOOD LEFT HAND Performed at [facility] [address] [place], [place] [address]    Special Requests   Final    BOTTLES DRAWN AEROBIC AND ANAEROBIC Blood Culture adequate volume Performed at [facility] [address] [place], [place] [address]    Culture  Setup Time   Final    GRAM POSITIVE COCCI IN CLUSTERS ANAEROBIC BOTTLE ONLY CRITICAL RESULT CALLED TO, READ BACK BY AND VERIFIED WITH: DR Doristine Bosworth RAVI [id number] AT 546 AM BY CM Performed at [facility], [place] [address] [place], [place] [address]    Culture METHICILLIN RESISTANT STAPHYLOCOCCUS AUREUS (A)  Final   Report Status 12/05/2019 FINAL  Final   Organism ID, Bacteria METHICILLIN RESISTANT STAPHYLOCOCCUS AUREUS  Final      Susceptibility   Methicillin resistant staphylococcus aureus - MIC*    CIPROFLOXACIN <=0.5 SENSITIVE Sensitive     ERYTHROMYCIN >=8 RESISTANT Resistant     GENTAMICIN <=0.5 SENSITIVE Sensitive     OXACILLIN >=4 RESISTANT Resistant     TETRACYCLINE >=16 RESISTANT Resistant     VANCOMYCIN <=0.5 SENSITIVE Sensitive     TRIMETH/SULFA <=10 SENSITIVE Sensitive     CLINDAMYCIN <=0.25 SENSITIVE Sensitive     RIFAMPIN <=0.5 SENSITIVE Sensitive     Inducible Clindamycin NEGATIVE Sensitive     * METHICILLIN RESISTANT  STAPHYLOCOCCUS AUREUS  Blood Culture ID Panel (Reflexed)     Status: Abnormal   Collection Time: 12/02/19  3:44 AM  Result Value Ref Range Status   Enterococcus species NOT DETECTED NOT DETECTED Final   Listeria monocytogenes NOT DETECTED NOT DETECTED Final   Staphylococcus species DETECTED (A) NOT DETECTED Final    Comment: CRITICAL RESULT CALLED TO, READ BACK BY AND VERIFIED WITH: DR Doristine Bosworth R [id number] AT 835 AM BY CM    Staphylococcus aureus (BCID) DETECTED (A) NOT DETECTED Final    Comment: Methicillin (oxacillin)-resistant Staphylococcus aureus (MRSA). MRSA is predictably resistant to beta-lactam antibiotics (except ceftaroline). Preferred therapy is vancomycin unless clinically contraindicated. Alvin Daniels requires contact precautions if  hospitalized. CRITICAL RESULT CALLED TO, READ BACK BY AND VERIFIED WITH: DR Loann Quill [id number] AT 835 BY CM    Methicillin resistance DETECTED (A) NOT DETECTED Final    Comment: CRITICAL RESULT CALLED TO, READ BACK BY AND VERIFIED WITH: DR Doristine Bosworth R [id number] AT 85 AM BY CM    Streptococcus species NOT DETECTED NOT DETECTED Final   Streptococcus agalactiae NOT DETECTED NOT DETECTED Final   Streptococcus pneumoniae NOT DETECTED NOT DETECTED Final   Streptococcus pyogenes NOT DETECTED NOT DETECTED Final   Acinetobacter baumannii NOT DETECTED NOT DETECTED Final   Enterobacteriaceae species NOT DETECTED NOT DETECTED Final   Enterobacter cloacae complex NOT DETECTED NOT DETECTED Final   Escherichia coli NOT DETECTED NOT DETECTED Final   Klebsiella oxytoca NOT DETECTED NOT DETECTED Final   Klebsiella pneumoniae NOT DETECTED NOT DETECTED Final   Proteus species NOT DETECTED NOT DETECTED Final   Serratia marcescens NOT DETECTED NOT DETECTED Final   Haemophilus influenzae NOT DETECTED NOT DETECTED Final   Neisseria meningitidis NOT DETECTED NOT DETECTED Final   Pseudomonas aeruginosa NOT DETECTED NOT DETECTED Final   Candida albicans NOT DETECTED NOT DETECTED  Final   Candida glabrata NOT DETECTED NOT DETECTED Final   Candida krusei NOT DETECTED NOT DETECTED Final  Candida parapsilosis NOT DETECTED NOT DETECTED Final   Candida tropicalis NOT DETECTED NOT DETECTED Final    Comment: Performed at [facility], [place] [address] [place], [place] [address]  SARS Coronavirus 2 by RT PCR (hospital order, performed in [facility] hospital lab) Nasopharyngeal Nasopharyngeal Swab     Status: None   Collection Time: 12/02/19  6:53 AM   Specimen: Nasopharyngeal Swab  Result Value Ref Range Status   SARS Coronavirus 2 NEGATIVE NEGATIVE Final    Comment: (NOTE) SARS-CoV-2 target nucleic acids are NOT DETECTED.  The SARS-CoV-2 RNA is generally detectable in upper and lower respiratory specimens during the acute phase of infection. The lowest concentration of SARS-CoV-2 viral copies this assay can detect is 250 copies / mL. A negative result does not preclude SARS-CoV-2 infection and should not be used as the sole basis for treatment or other Alvin Daniels management decisions.  A negative result may occur with improper specimen collection / handling, submission of specimen other than nasopharyngeal swab, presence of viral mutation(s) within the areas targeted by this assay, and inadequate number of viral copies (<250 copies / mL). A negative result must be combined with clinical observations, Alvin Daniels history, and epidemiological information.  Fact Sheet for Patients:   StrictlyIdeas.no  Fact Sheet for Healthcare Providers: BankingDealers.co.za  This test is not yet approved or  cleared by the [place] FDA and has been authorized for detection and/or diagnosis of SARS-CoV-2 by FDA under an Emergency Use Authorization (EUA).  This EUA will remain in effect (meaning this test can be used) for the duration of the COVID-19 declaration under Section 564(b)(1) of the Act, 21 U.S.C. section 360bbb-3(b)(1),  unless the authorization is terminated or revoked sooner.  Performed at [facility], [place] [address], [place], [place] [address]   MRSA PCR Screening     Status: Abnormal   Collection Time: 12/02/19 10:57 PM   Specimen: Nasopharyngeal  Result Value Ref Range Status   MRSA by PCR POSITIVE (A) NEGATIVE Final    Comment:        The GeneXpert MRSA Assay (FDA approved for NASAL specimens only), is one component of a comprehensive MRSA colonization surveillance program. It is not intended to diagnose MRSA infection nor to guide or monitor treatment for MRSA infections. RESULT CALLED TO, READ BACK BY AND VERIFIED WITHPhil Dopp RN 12/03/19 0056 JDW Performed at [facility], [place] [address], [place], Alvin Daniels [address]   Culture, blood (routine x 2)     Status: None (Preliminary result)   Collection Time: 12/04/19  7:25 PM   Specimen: BLOOD RIGHT HAND  Result Value Ref Range Status   Specimen Description BLOOD RIGHT HAND  Final   Special Requests   Final    BOTTLES DRAWN AEROBIC AND ANAEROBIC Blood Culture adequate volume   Culture   Final    NO GROWTH 3 DAYS Performed at [facility], [place] [address], [place], [place] [address]    Report Status PENDING  Incomplete  Culture, blood (routine x 2)     Status: None (Preliminary result)   Collection Time: 12/04/19  7:30 PM   Specimen: BLOOD  Result Value Ref Range Status   Specimen Description BLOOD RIGHT ANTECUBITAL  Final   Special Requests   Final    BOTTLES DRAWN AEROBIC AND ANAEROBIC Blood Culture adequate volume   Culture   Final    NO GROWTH 3 DAYS Performed at [facility], [address] [place], [place] [address]    Report Status PENDING  Incomplete  SARS Coronavirus 2 by RT PCR (hospital order, performed in [facility] hospital lab) Nasopharyngeal Nasopharyngeal Swab     Status: None   Collection Time: 12/04/19  8:50 PM   Specimen: Nasopharyngeal Swab  Result Value Ref Range  Status   SARS Coronavirus 2 NEGATIVE NEGATIVE Final    Comment: (NOTE) SARS-CoV-2 target nucleic acids are NOT DETECTED.  The SARS-CoV-2 RNA is generally detectable in upper and lower respiratory specimens during the acute phase of infection. The lowest concentration of SARS-CoV-2 viral copies this assay can detect is 250 copies / mL. A negative result does not preclude SARS-CoV-2 infection and should not be used as the sole basis for treatment or other Alvin Daniels management decisions.  A negative result may occur with improper specimen collection / handling, submission of specimen other than nasopharyngeal swab, presence of viral mutation(s) within the areas targeted by this assay, and inadequate number of viral copies (<250 copies / mL). A negative result must be combined with clinical observations, Alvin Daniels history, and epidemiological information.  Fact Sheet for Patients:   StrictlyIdeas.no  Fact Sheet for Healthcare Providers: BankingDealers.co.za  This test is not yet approved or  cleared by the [place] FDA and has been authorized for detection and/or diagnosis of SARS-CoV-2 by FDA under an Emergency Use Authorization (EUA).  This EUA will remain in effect (meaning this test can be used) for the duration of the COVID-19 declaration under Section 564(b)(1) of the Act, 21 U.S.C. section 360bbb-3(b)(1), unless the authorization is terminated or revoked sooner.  Performed at [facility], [place] [address] [place], [place] [address]          Radiology Studies: ECHOCARDIOGRAM LIMITED  Result Date: 12/06/2019    ECHOCARDIOGRAM LIMITED REPORT   Alvin Daniels Name:   KENN REKOWSKI Date of Exam: 12/06/2019 Medical Rec #:  [id number]              Height:       60.0 in Accession #:    [id number]             Weight:       110.4 lb Date of Birth:  December 08, 1970               BSA:          1.450 m Alvin Daniels Age:    72 years                BP:           87/69 mmHg Alvin Daniels Gender: M                      HR:           89 bpm. Exam Location:  Inpatient Procedure: 2D Echo and Color Doppler Indications:    Bacteremia R78.81  History:        Alvin Daniels has prior history of Echocardiogram examinations, most                 recent 09/01/2019. Risk Factors:Diabetes.  Sonographer:    Raquel Sarna Senior RDCS Referring Phys: [id number] RAVI PAHWANI IMPRESSIONS  1. Left ventricular ejection fraction, by estimation, is 55 to 60%. The left ventricle has normal function.  2. Right ventricular systolic function is normal. The right ventricular size is normal. Tricuspid regurgitation signal is inadequate for assessing PA pressure.  3. The mitral valve is grossly normal. No  evidence of mitral valve regurgitation. No evidence of mitral stenosis.  4. The aortic valve is tricuspid. Aortic valve regurgitation is not visualized. No aortic stenosis is present.  5. The inferior vena cava is normal in size with greater than 50% respiratory variability, suggesting right atrial pressure of 3 mmHg. Comparison(s): No significant change from prior study. Conclusion(s)/Recommendation(s): No evidence of valvular vegetations on this transthoracic echocardiogram. Would recommend a transesophageal echocardiogram to exclude infective endocarditis if clinically indicated. FINDINGS  Left Ventricle: Left ventricular ejection fraction, by estimation, is 55 to 60%. The left ventricle has normal function. Right Ventricle: The right ventricular size is normal. No increase in right ventricular wall thickness. Right ventricular systolic function is normal. Tricuspid regurgitation signal is inadequate for assessing PA pressure. Pericardium: Trivial pericardial effusion is present. Mitral Valve: The mitral valve is grossly normal. No evidence of mitral valve stenosis. There is no evidence of mitral valve vegetation. Tricuspid Valve: The tricuspid valve is grossly normal. Tricuspid valve regurgitation is not  demonstrated. No evidence of tricuspid stenosis. There is no evidence of tricuspid valve vegetation. Aortic Valve: The aortic valve is tricuspid. Aortic valve regurgitation is not visualized. No aortic stenosis is present. There is no evidence of aortic valve vegetation. Pulmonic Valve: The pulmonic valve was grossly normal. Pulmonic valve regurgitation is not visualized. No evidence of pulmonic stenosis. There is no evidence of pulmonic valve vegetation. Venous: The inferior vena cava is normal in size with greater than 50% respiratory variability, suggesting right atrial pressure of 3 mmHg. Eleonore Chiquito MD Electronically signed by Eleonore Chiquito MD Signature Date/Time: 12/06/2019/5:22:00 PM    Final         Scheduled Meds: . acetaminophen      . bictegravir-emtricitabine-tenofovir AF  1 tablet Oral Daily  . heparin  5,000 Units Subcutaneous Q8H  . insulin aspart  0-9 Units Subcutaneous TID WC  . insulin NPH Human  12 Units Subcutaneous BID AC & HS  . midodrine  5 mg Oral TID WC  . vancomycin       Continuous Infusions: . vancomycin       LOS: 3 days    Time spent: 35 minutes    Danford Tat A Jennylee Uehara, MD [facility]   If 7PM-7AM, please contact night-coverage [email or web address]  12/08/2019, 2:19 PM

## 2019-12-08 NOTE — Procedures (Signed)
I was present at this dialysis session. I have reviewed the session itself and made appropriate changes.   qbf 400, uf goal 2000, 91/72, hr 113. lue avf Wants to go home.   Filed Weights   12/06/19 1207 12/07/19 2209 12/08/19 1221  Weight: 50.1 kg 51.7 kg 51.7 kg    Recent Labs  Lab 12/06/19 0759 12/06/19 0759 12/08/19 1102  NA 134*   < > 139  K 4.4   < > 4.7  CL 96*   < > 99  CO2 21*   < > 22  GLUCOSE 183*   < > 109*  BUN 49*   < > 43*  CREATININE 11.85*   < > 10.15*  CALCIUM 9.3   < > 9.9  PHOS 7.0*  --   --    < > = values in this interval not displayed.    Recent Labs  Lab 12/02/19 0343 12/03/19 0448 12/05/19 0042 12/06/19 0230 12/08/19 1102  WBC 10.2   < > 9.1 8.0 7.7  NEUTROABS 7.2  --   --  4.6  --   HGB 11.9*   < > 10.9* 11.1* 11.3*  HCT 35.5*   < > 32.6* 32.9* 34.2*  MCV 102.9*   < > 101.2* 100.0 102.1*  PLT 173   < > 219 240 294   < > = values in this interval not displayed.    Scheduled Meds: . acetaminophen      . bictegravir-emtricitabine-tenofovir AF  1 tablet Oral Daily  . heparin  5,000 Units Subcutaneous Q8H  . insulin aspart  0-9 Units Subcutaneous TID WC  . insulin NPH Human  12 Units Subcutaneous BID AC & HS  . midodrine  5 mg Oral TID WC  . vancomycin       Continuous Infusions: . vancomycin     PRN Meds:.acetaminophen **OR** acetaminophen, ondansetron **OR** ondansetron (ZOFRAN) IV   Gean Quint, MD [facility] 12/08/2019, 1:52 PM

## 2019-12-08 NOTE — Progress Notes (Signed)
Pharmacy Antibiotic Note  Alvin Daniels is a 49 y.o. male admitted on 12/04/2019 with MRSA bacteremia.  Pt is on ESRD on HD - MWF.   Discharged 7/23 (cellulitis admission), called to come back d/t BCx resulting positive from 7/22.  Was on vancomycin with last    dose of 500 mg IV on 7/23 after iHD.  ESRD - HD MWF.  A vancomycin level was checked 12/05/19. While waiting for this value to result, the patient was given Zyvox x1 on 7/25 as a backup incase the patient's vancomycin level was subtherapeutic. Incidentally, the vancomycin level returned at 18 mcg/mL which was therapeutic.  Continued Vancomycin 500mg  IV qHD- MWF. Zyvox was discontinued.  LA 0.9, WBC wnl. Afebrile TTE negative, to  get TEE today.   Plan: Continue vancomycin 500 mg IV q HD Mon/Wed/Fri  Target vancomycin level pre-HD 15-25 mcg/mL)    Weight: 51.7 kg (113 lb 15.7 oz)  Temp (24hrs), Avg:98.1 F (36.7 C), Min:97.8 F (36.6 C), Max:98.6 F (37 C)  Recent Labs  Lab 12/02/19 0343 12/02/19 0344 12/03/19 0448 12/04/19 1521 12/04/19 1925 12/05/19 0042 12/05/19 1237 12/06/19 0230 12/06/19 0759 12/08/19 1102  WBC   < >  --  9.6 10.3  --  9.1  --  8.0  --  7.7  CREATININE   < >  --  9.06* 6.96*  --  8.26*  --   --  11.85* 10.15*  LATICACIDVEN  --  0.8  --   --  0.7  --   --   --   --  0.9  VANCORANDOM  --   --   --   --   --   --  18  --   --   --    < > = values in this interval not displayed.    Estimated Creatinine Clearance: 6.2 mL/min (A) (by C-G formula based on SCr of 10.15 mg/dL (H)).    No Known Allergies   Antimicrobials: Vanc 7/22 >> Zyvoxx 7/25 x1  Micropbiology 7/22 BCx: MRSA (1/4 anaerobic bottle) 7/24 BCx: no growth to date x 3 days   Nicole Cella, [place] Clinical Pharmacist [phone] Please check AMION for all [place] phone numbers After 10:00 PM, call [place] [phone] 12/08/2019 3:46 PM

## 2019-12-09 ENCOUNTER — Other Ambulatory Visit (HOSPITAL_COMMUNITY): Payer: Self-pay

## 2019-12-09 ENCOUNTER — Encounter (HOSPITAL_COMMUNITY): Payer: Self-pay | Admitting: Certified Registered Nurse Anesthetist

## 2019-12-09 ENCOUNTER — Encounter (HOSPITAL_COMMUNITY)
Admission: EM | Disposition: A | Discharge status: Home / Self Care | Payer: Self-pay | Source: Home / Self Care | Attending: Family Medicine

## 2019-12-09 LAB — CULTURE, BLOOD (ROUTINE X 2)
Culture: NO GROWTH
Culture: NO GROWTH
Special Requests: ADEQUATE
Special Requests: ADEQUATE

## 2019-12-09 LAB — GLUCOSE, CAPILLARY
Glucose-Capillary: 129 mg/dL — ABNORMAL HIGH (ref 70–99)
Glucose-Capillary: 103 mg/dL — ABNORMAL HIGH (ref 70–99)
Glucose-Capillary: 167 mg/dL — ABNORMAL HIGH (ref 70–99)
Glucose-Capillary: 112 mg/dL — ABNORMAL HIGH (ref 70–99)

## 2019-12-09 LAB — CORTISOL-AM, BLOOD: Cortisol - AM: 15.5 ug/dL (ref 6.7–22.6)

## 2019-12-09 LAB — CBC
HCT: 35.1 % — ABNORMAL LOW (ref 39.0–52.0)
Hemoglobin: 11.5 g/dL — ABNORMAL LOW (ref 13.0–17.0)
MCH: 33.7 pg (ref 26.0–34.0)
MCHC: 32.8 g/dL (ref 30.0–36.0)
MCV: 102.9 fL — ABNORMAL HIGH (ref 80.0–100.0)
Platelets: 317 10*3/uL (ref 150–400)
RBC: 3.41 MIL/uL — ABNORMAL LOW (ref 4.22–5.81)
RDW: 13.2 % (ref 11.5–15.5)
WBC: 9.2 10*3/uL (ref 4.0–10.5)
nRBC: 0 % (ref 0.0–0.2)

## 2019-12-09 SURGERY — CANCELLED PROCEDURE

## 2019-12-09 MED ORDER — SODIUM CHLORIDE 0.9 % IV BOLUS
250.0000 mL | Freq: Once | INTRAVENOUS | Status: AC
  Administered 2019-12-09: 250 mL via INTRAVENOUS

## 2019-12-09 MED ORDER — CHLORHEXIDINE GLUCONATE CLOTH 2 % EX PADS
6.0000 | MEDICATED_PAD | Freq: Every day | CUTANEOUS | Status: DC
  Administered 2019-12-09 – 2019-12-10 (×2): 6 via TOPICAL

## 2019-12-09 MED ORDER — SODIUM CHLORIDE 0.9 % IV SOLN: INTRAVENOUS | Status: DC

## 2019-12-09 MED ORDER — MIDODRINE HCL 5 MG PO TABS
10.0000 mg | ORAL_TABLET | Freq: Three times a day (TID) | ORAL | Status: DC
  Administered 2019-12-09 – 2019-12-10 (×4): 10 mg via ORAL
  Filled 2019-12-09 (×4): qty 2

## 2019-12-09 NOTE — Progress Notes (Signed)
[facility] Progress Note   Subjective:   Patient seen and examined at bedside.  Reports feeling well.  Denies SOB, CP, palpitations, dizziness, weakness and fatigue.  Plan to have TEE this AM but has been cancelled again due to hypotension.    Objective Vitals:   12/09/19 0126 12/09/19 0533 12/09/19 0900 12/09/19 0915  BP: (!) 72/48 (!) 72/45 (!) 66/47 (!) 66/47  Pulse: 88 91 92 92  Resp: 16 18 18 18   Temp: 98.1 F (36.7 C) 97.8 F (36.6 C) 98.4 F (36.9 C) 98.4 F (36.9 C)  TempSrc: Oral Oral Oral Oral  SpO2: 97% 96%  97%  Weight:       Physical Exam General:Chronically ill appearing male in NAD Heart:RRR Lungs:CTAB Abdomen:soft, NTND Extremities:b/l BKA, ulcer on R stump - clean and dry Dialysis Access: LU AVF +b   Filed Weights   12/08/19 1221 12/08/19 1626 12/08/19 2122  Weight: 51.7 kg 49.9 kg 50.7 kg    Intake/Output Summary (Last 24 hours) at 12/09/2019 1044 Last data filed at 12/09/2019 0925 Gross per 24 hour  Intake 580 ml  Output 1000 ml  Net -420 ml    Additional Objective Labs: Basic Metabolic Panel: Recent Labs  Lab 12/05/19 0042 12/06/19 0759 12/08/19 1102  NA 134* 134* 139  K 3.8 4.4 4.7  CL 95* 96* 99  CO2 22 21* 22  GLUCOSE 245* 183* 109*  BUN 31* 49* 43*  CREATININE 8.26* 11.85* 10.15*  CALCIUM 9.3 9.3 9.9  PHOS  --  7.0*  --    Liver Function Tests: Recent Labs  Lab 12/06/19 0759  ALBUMIN 2.5*   CBC: Recent Labs  Lab 12/04/19 1521 12/04/19 1521 12/05/19 0042 12/05/19 0042 12/06/19 0230 12/08/19 1102 12/09/19 0806  WBC 10.3   < > 9.1   < > 8.0 7.7 9.2  NEUTROABS  --   --   --   --  4.6  --   --   HGB 10.9*   < > 10.9*   < > 11.1* 11.3* 11.5*  HCT 33.9*   < > 32.6*   < > 32.9* 34.2* 35.1*  MCV 102.7*  --  101.2*  --  100.0 102.1* 102.9*  PLT 208   < > 219   < > 240 294 317   < > = values in this interval not displayed.   CBG: Recent Labs  Lab 12/08/19 0640 12/08/19 1130 12/08/19 1708  12/08/19 2105 12/09/19 0704  GLUCAP 106* 84 119* 167* 129*    Medications: . sodium chloride 20 mL/hr at 12/09/19 0827  . vancomycin     . bictegravir-emtricitabine-tenofovir AF  1 tablet Oral Daily  . heparin  5,000 Units Subcutaneous Q8H  . insulin aspart  0-9 Units Subcutaneous TID WC  . insulin NPH Human  12 Units Subcutaneous BID AC & HS  . midodrine  5 mg Oral TID WC    Dialysis Orders: MWF NW 4h 50.0 kg edw 400/ 2/2 bath LUE AVF Hep 2000  hect35mcg  Holding Mircera 60 last on 10/27/19 With hgb 11.3 On 12/01/19  Assessment/Plan: 1.MRSA bacteremia- recurrent bacteremia. Etiology unclear. ID following. Plan for Vanc with HD. TEE cancelled and rescheduled again d/t hypotension. To have Vanc 2-6 wks with dialysis pending TEE.Per ID. 2. ESRD- on HD MWF. Next HD 7/30.  Can be completed at OP if no longer admitted. K 4.4. 3. Anemiaof CKD-Hgb 11.5. No indication for ESA at this time. 4. Secondary hyperparathyroidism- Last CCa 10.5,  VDRA decreased, use 2Ca bath. Phos high. Continue binders.Marland Kitchen 5.Hypotension/volume- Chronic hypotension, asymptomatic.  Started on midodrine.  Will not be able to continue midodrine as outpatient unless some sort of patient payment assistance is provided.  We have attempted to prescribe in the past but it is expensive and he can not afford it and in the past was not covered by emergency medicaid.  Tolerates as OP to run on HD with SBP 70s per Dr. Lorrene Reid standing order. Does not appear volume overloaded.  Minimal UF as tolerated.  6. Nutrition- Renal diet w/fluid restrictions.  7.R BKA cellulitis- recent admit 7/22-7/23. D/c on Keflex. Per surgery did not require surgical intervention. On Vanc 8.DMT2per admit 9. HIV - per ID   Jen Mow, PA-C [facility] 12/09/2019,10:44 AM  LOS: 4 days

## 2019-12-09 NOTE — Progress Notes (Signed)
Pt's TEE today cancelled per Dr. Stanford Breed and Dr. Kerin Perna due to low blood pressure. Pt's RN [phone]) Maggie Font. made aware. Cardiac scheduler Daleen Snook made aware and pt will be rescheduled for  12/10/19. Jobe Igo, RN

## 2019-12-09 NOTE — Progress Notes (Signed)
PT Cancellation Note  Patient Details Name: Marguerite Jarboe MRN: [id number] DOB: 1970/12/26   Cancelled Treatment:    Reason Eval/Treat Not Completed: Medical issues which prohibited therapy. Patient has hypotension and RN asked that I hold PT evaluation for today. Will re-attempt tomorrow.    Tannia Contino 12/09/2019, 12:30 PM

## 2019-12-09 NOTE — Progress Notes (Signed)
PROGRESS NOTE    Alvin Daniels  LKG:[id number] DOB: Sep 30, 1970 DOA: 12/04/2019 PCP: Patient, No Pcp Per   Brief Narrative: 49 year old with past medical history significant for diabetes type 2, HIV on HAART, ESRD on M WF dialysis, bilateral below-knee amputation.  Patient was just admitted from 7/20 20278/23 with cellulitis of his right BKA stump.  Patient received vancomycin during admission.  His cellulitis was significantly improved, Dr. Sharol Given felt he did not need surgery.  Patient was discharged on 7/23 on Keflex.  Since discharge 1 out of 2 bottles of blood cultures  came back with positive for MRSA.  Patient was informed of the result and he presented for admission.    Assessment & Plan:   Principal Problem:   MRSA bacteremia Active Problems:   Diabetes mellitus with ESRD (end-stage renal disease) ([place])   Cellulitis of right lower extremity   ESRD on hemodialysis ([name])   HIV disease ([place])   HIV (human immunodeficiency virus infection) ([place])   Bacteremia   Amputation stump infection ([place])  1-MRSA bacteremia/right stump cellulitis: -Continue with IV vancomycin per ID recommendation. -Transthoracic echo negative for vegetation. -Patient was supposed to have TEE - but due to low blood pressure test was canceled twice.  -Started on midodrine for hypotension.  - Discussed with ID and nephrology due to chronic low blood pressure and risk for TEE will proceed with 6 weeks treatment of IV vancomycin with HD>    2-ESRD on hemodialysis -Had HD 7/28  3-Diabetes type 2: Continue with Novolin.  4-HIV: Follow-up with ID.  Continue with Biktarvi.  5-Hypotension: chronic Lactic acid normal, CBC, globin stable BP lower today after HD yesterday.  Discussed with nephrology plan to increase midodrine to 10 mg.  CM to help with medication assistance.   Estimated body mass index is 21.83 kg/m as calculated from the following:   Height as of 09/27/19: 5' (1.524 m).   Weight as of  this encounter: 50.7 kg.   DVT prophylaxis: Heparin Code Status: Full code Family Communication: Discussed with patient Disposition Plan:  Status is: Inpatient  Remains inpatient appropriate because:Ongoing diagnostic testing needed not appropriate for outpatient work up   Dispo: The patient is from: Home              Anticipated d/c is to: Home              Anticipated d/c date is: 2 days              Patient currently is not medically stable to d/c.  Plan to discharge 7/30 if BP stable.         Consultants:   ID  Nephrology   Procedures:   HD  TEE  Antimicrobials:  Vancomycin   Subjective: He is alert, conversant , denies dizziness.  Objective: Vitals:   12/08/19 2122 12/09/19 0116 12/09/19 0126 12/09/19 0533  BP: (!) 79/56 (!) 134/104 (!) 72/48 (!) 72/45  Pulse: 89 90 88 91  Resp: 16 16 16 18   Temp: 98.4 F (36.9 C) 98.1 F (36.7 C) 98.1 F (36.7 C) 97.8 F (36.6 C)  TempSrc: Oral Oral Oral Oral  SpO2: 98% 97% 97% 96%  Weight: 50.7 kg       Intake/Output Summary (Last 24 hours) at 12/09/2019 0859 Last data filed at 12/09/2019 0600 Gross per 24 hour  Intake 340 ml  Output 1000 ml  Net -660 ml   Filed Weights   12/08/19 1221 12/08/19 1626 12/08/19 2122  Weight: 51.7  kg 49.9 kg 50.7 kg    Examination:  General exam: NAD Respiratory system: CTA Cardiovascular system: S 1, S 2 RRR Gastrointestinal system: BS present, soft, nt Central nervous system: Alert and conversant Extremities: BL BKA   Data Reviewed: I have personally reviewed following labs and imaging studies  CBC: Recent Labs  Lab 12/04/19 1521 12/05/19 0042 12/06/19 0230 12/08/19 1102 12/09/19 0806  WBC 10.3 9.1 8.0 7.7 9.2  NEUTROABS  --   --  4.6  --   --   HGB 10.9* 10.9* 11.1* 11.3* 11.5*  HCT 33.9* 32.6* 32.9* 34.2* 35.1*  MCV 102.7* 101.2* 100.0 102.1* 102.9*  PLT 208 219 240 294 798   Basic Metabolic Panel: Recent Labs  Lab 12/03/19 0448 12/04/19 1521  12/05/19 0042 12/06/19 0759 12/08/19 1102  NA 136 134* 134* 134* 139  K 4.0 3.7 3.8 4.4 4.7  CL 93* 93* 95* 96* 99  CO2 28 26 22  21* 22  GLUCOSE 179* 189* 245* 183* 109*  BUN 24* 27* 31* 49* 43*  CREATININE 9.06* 6.96* 8.26* 11.85* 10.15*  CALCIUM 8.9 9.6 9.3 9.3 9.9  PHOS  --   --   --  7.0*  --    GFR: Estimated Creatinine Clearance: 6.2 mL/min (A) (by C-G formula based on SCr of 10.15 mg/dL (H)). Liver Function Tests: Recent Labs  Lab 12/06/19 0759  ALBUMIN 2.5*   No results for input(s): LIPASE, AMYLASE in the last 168 hours. No results for input(s): AMMONIA in the last 168 hours. Coagulation Profile: No results for input(s): INR, PROTIME in the last 168 hours. Cardiac Enzymes: No results for input(s): CKTOTAL, CKMB, CKMBINDEX, TROPONINI in the last 168 hours. BNP (last 3 results) No results for input(s): PROBNP in the last 8760 hours. HbA1C: No results for input(s): HGBA1C in the last 72 hours. CBG: Recent Labs  Lab 12/08/19 0640 12/08/19 1130 12/08/19 1708 12/08/19 2105 12/09/19 0704  GLUCAP 106* 84 119* 167* 129*   Lipid Profile: No results for input(s): CHOL, HDL, LDLCALC, TRIG, CHOLHDL, LDLDIRECT in the last 72 hours. Thyroid Function Tests: No results for input(s): TSH, T4TOTAL, FREET4, T3FREE, THYROIDAB in the last 72 hours. Anemia Panel: No results for input(s): VITAMINB12, FOLATE, FERRITIN, TIBC, IRON, RETICCTPCT in the last 72 hours. Sepsis Labs: Recent Labs  Lab 12/04/19 1925 12/08/19 1102 12/08/19 1733  LATICACIDVEN 0.7 0.9 2.1*    Recent Results (from the past 240 hour(s))  Blood culture (routine x 2)     Status: None   Collection Time: 12/02/19  3:43 AM   Specimen: BLOOD RIGHT FOREARM  Result Value Ref Range Status   Specimen Description   Final    BLOOD RIGHT FOREARM Performed at [facility] [address] [place], [place] [address]    Special Requests   Final    BOTTLES DRAWN AEROBIC AND ANAEROBIC Blood Culture adequate  volume Performed at [facility] [address] [place], [place] [address]    Culture   Final    NO GROWTH 5 DAYS Performed at [facility], [place] [address], [place], [place] [address]    Report Status 12/07/2019 FINAL  Final  Blood culture (routine x 2)     Status: Abnormal   Collection Time: 12/02/19  3:44 AM   Specimen: BLOOD LEFT HAND  Result Value Ref Range Status   Specimen Description   Final    BLOOD LEFT HAND Performed at [facility] [address]  [place], [place] [address]    Special Requests   Final    BOTTLES DRAWN AEROBIC AND ANAEROBIC Blood Culture adequate volume Performed at [facility] [address], [place], [place] [address]    Culture  Setup Time   Final    GRAM POSITIVE COCCI IN CLUSTERS ANAEROBIC BOTTLE ONLY CRITICAL RESULT CALLED TO, READ BACK BY AND VERIFIED WITH: DR Doristine Bosworth RAVI [id number] AT 130 AM BY CM Performed at [facility], [place] [address], [place], [place] [address]    Culture METHICILLIN RESISTANT STAPHYLOCOCCUS AUREUS (A)  Final   Report Status 12/05/2019 FINAL  Final   Organism ID, Bacteria METHICILLIN RESISTANT STAPHYLOCOCCUS AUREUS  Final      Susceptibility   Methicillin resistant staphylococcus aureus - MIC*    CIPROFLOXACIN <=0.5 SENSITIVE Sensitive     ERYTHROMYCIN >=8 RESISTANT Resistant     GENTAMICIN <=0.5 SENSITIVE Sensitive     OXACILLIN >=4 RESISTANT Resistant     TETRACYCLINE >=16 RESISTANT Resistant     VANCOMYCIN <=0.5 SENSITIVE Sensitive     TRIMETH/SULFA <=10 SENSITIVE Sensitive     CLINDAMYCIN <=0.25 SENSITIVE Sensitive     RIFAMPIN <=0.5 SENSITIVE Sensitive     Inducible Clindamycin NEGATIVE Sensitive     * METHICILLIN RESISTANT STAPHYLOCOCCUS AUREUS  Blood Culture ID Panel (Reflexed)     Status: Abnormal   Collection Time: 12/02/19  3:44 AM  Result Value Ref Range Status   Enterococcus species NOT DETECTED NOT DETECTED Final   Listeria  monocytogenes NOT DETECTED NOT DETECTED Final   Staphylococcus species DETECTED (A) NOT DETECTED Final    Comment: CRITICAL RESULT CALLED TO, READ BACK BY AND VERIFIED WITH: DR Doristine Bosworth R [id number] AT 835 AM BY CM    Staphylococcus aureus (BCID) DETECTED (A) NOT DETECTED Final    Comment: Methicillin (oxacillin)-resistant Staphylococcus aureus (MRSA). MRSA is predictably resistant to beta-lactam antibiotics (except ceftaroline). Preferred therapy is vancomycin unless clinically contraindicated. Patient requires contact precautions if  hospitalized. CRITICAL RESULT CALLED TO, READ BACK BY AND VERIFIED WITH: DR Loann Quill [id number] AT 835 BY CM    Methicillin resistance DETECTED (A) NOT DETECTED Final    Comment: CRITICAL RESULT CALLED TO, READ BACK BY AND VERIFIED WITH: DR Doristine Bosworth R [id number] AT 835 AM BY CM    Streptococcus species NOT DETECTED NOT DETECTED Final   Streptococcus agalactiae NOT DETECTED NOT DETECTED Final   Streptococcus pneumoniae NOT DETECTED NOT DETECTED Final   Streptococcus pyogenes NOT DETECTED NOT DETECTED Final   Acinetobacter baumannii NOT DETECTED NOT DETECTED Final   Enterobacteriaceae species NOT DETECTED NOT DETECTED Final   Enterobacter cloacae complex NOT DETECTED NOT DETECTED Final   Escherichia coli NOT DETECTED NOT DETECTED Final   Klebsiella oxytoca NOT DETECTED NOT DETECTED Final   Klebsiella pneumoniae NOT DETECTED NOT DETECTED Final   Proteus species NOT DETECTED NOT DETECTED Final   Serratia marcescens NOT DETECTED NOT DETECTED Final   Haemophilus influenzae NOT DETECTED NOT DETECTED Final   Neisseria meningitidis NOT DETECTED NOT DETECTED Final   Pseudomonas aeruginosa NOT DETECTED NOT DETECTED Final   Candida albicans NOT DETECTED NOT DETECTED Final   Candida glabrata NOT DETECTED NOT DETECTED Final   Candida krusei NOT DETECTED NOT DETECTED Final   Candida parapsilosis NOT DETECTED NOT DETECTED Final   Candida tropicalis NOT DETECTED NOT DETECTED Final     Comment: Performed at [facility], [address], [place], [place] [address]  SARS Coronavirus 2 by RT PCR (hospital  order, performed in [facility] hospital lab) Nasopharyngeal Nasopharyngeal Swab     Status: None   Collection Time: 12/02/19  6:53 AM   Specimen: Nasopharyngeal Swab  Result Value Ref Range Status   SARS Coronavirus 2 NEGATIVE NEGATIVE Final    Comment: (NOTE) SARS-CoV-2 target nucleic acids are NOT DETECTED.  The SARS-CoV-2 RNA is generally detectable in upper and lower respiratory specimens during the acute phase of infection. The lowest concentration of SARS-CoV-2 viral copies this assay can detect is 250 copies / mL. A negative result does not preclude SARS-CoV-2 infection and should not be used as the sole basis for treatment or other patient management decisions.  A negative result may occur with improper specimen collection / handling, submission of specimen other than nasopharyngeal swab, presence of viral mutation(s) within the areas targeted by this assay, and inadequate number of viral copies (<250 copies / mL). A negative result must be combined with clinical observations, patient history, and epidemiological information.  Fact Sheet for Patients:   StrictlyIdeas.no  Fact Sheet for Healthcare Providers: BankingDealers.co.za  This test is not yet approved or  cleared by the [place] FDA and has been authorized for detection and/or diagnosis of SARS-CoV-2 by FDA under an Emergency Use Authorization (EUA).  This EUA will remain in effect (meaning this test can be used) for the duration of the COVID-19 declaration under Section 564(b)(1) of the Act, 21 U.S.C. section 360bbb-3(b)(1), unless the authorization is terminated or revoked sooner.  Performed at [facility], [place] [address], [place], [place] [address]   MRSA PCR Screening     Status: Abnormal   Collection Time:  12/02/19 10:57 PM   Specimen: Nasopharyngeal  Result Value Ref Range Status   MRSA by PCR POSITIVE (A) NEGATIVE Final    Comment:        The GeneXpert MRSA Assay (FDA approved for NASAL specimens only), is one component of a comprehensive MRSA colonization surveillance program. It is not intended to diagnose MRSA infection nor to guide or monitor treatment for MRSA infections. RESULT CALLED TO, READ BACK BY AND VERIFIED WITHPhil Dopp RN 12/03/19 0056 JDW Performed at [facility], [place] [address], [place], [place] [address]   Culture, blood (routine x 2)     Status: None (Preliminary result)   Collection Time: 12/04/19  7:25 PM   Specimen: BLOOD RIGHT HAND  Result Value Ref Range Status   Specimen Description BLOOD RIGHT HAND  Final   Special Requests   Final    BOTTLES DRAWN AEROBIC AND ANAEROBIC Blood Culture adequate volume   Culture   Final    NO GROWTH 3 DAYS Performed at [facility], [place] [address], [place], [place] [address]    Report Status PENDING  Incomplete  Culture, blood (routine x 2)     Status: None (Preliminary result)   Collection Time: 12/04/19  7:30 PM   Specimen: BLOOD  Result Value Ref Range Status   Specimen Description BLOOD RIGHT ANTECUBITAL  Final   Special Requests   Final    BOTTLES DRAWN AEROBIC AND ANAEROBIC Blood Culture adequate volume   Culture   Final    NO GROWTH 3 DAYS Performed at [facility], [place] [address], [place], [place] [address]    Report Status PENDING  Incomplete  SARS Coronavirus 2 by RT PCR (hospital order, performed in [facility] hospital lab) Nasopharyngeal Nasopharyngeal Swab     Status: None   Collection  Time: 12/04/19  8:50 PM   Specimen: Nasopharyngeal Swab  Result Value Ref Range Status   SARS Coronavirus 2 NEGATIVE NEGATIVE Final    Comment: (NOTE) SARS-CoV-2 target nucleic acids are NOT DETECTED.  The SARS-CoV-2 RNA is generally detectable in upper and lower respiratory specimens during  the acute phase of infection. The lowest concentration of SARS-CoV-2 viral copies this assay can detect is 250 copies / mL. A negative result does not preclude SARS-CoV-2 infection and should not be used as the sole basis for treatment or other patient management decisions.  A negative result may occur with improper specimen collection / handling, submission of specimen other than nasopharyngeal swab, presence of viral mutation(s) within the areas targeted by this assay, and inadequate number of viral copies (<250 copies / mL). A negative result must be combined with clinical observations, patient history, and epidemiological information.  Fact Sheet for Patients:   StrictlyIdeas.no  Fact Sheet for Healthcare Providers: BankingDealers.co.za  This test is not yet approved or  cleared by the [place] FDA and has been authorized for detection and/or diagnosis of SARS-CoV-2 by FDA under an Emergency Use Authorization (EUA).  This EUA will remain in effect (meaning this test can be used) for the duration of the COVID-19 declaration under Section 564(b)(1) of the Act, 21 U.S.C. section 360bbb-3(b)(1), unless the authorization is terminated or revoked sooner.  Performed at [facility], [place] [address], [place], [place] [address]          Radiology Studies: No results found.      Scheduled Meds: . bictegravir-emtricitabine-tenofovir AF  1 tablet Oral Daily  . heparin  5,000 Units Subcutaneous Q8H  . insulin aspart  0-9 Units Subcutaneous TID WC  . insulin NPH Human  12 Units Subcutaneous BID AC & HS  . midodrine  5 mg Oral TID WC   Continuous Infusions: . sodium chloride 20 mL/hr at 12/09/19 0827  . vancomycin       LOS: 4 days    Time spent: 35 minutes    Embrie Mikkelsen A Dewane Timson, MD [facility]   If 7PM-7AM, please contact night-coverage [email or web address]  12/09/2019, 8:59 AM

## 2019-12-09 NOTE — TOC Progression Note (Signed)
Transition of Care [facility]) - Progression Note    Patient Details  Name: Alvin Daniels MRN: [id number] Date of Birth: 13-Mar-1971  Transition [facility]) CM/SW Contact  Bartholomew Crews, RN Phone Number: [phone] 12/09/2019, 4:50 PM  Clinical Narrative:     [name] with MD about patient need for midodrine, but patient has not been able to afford in the past. [facility] pharmacy has prescription assistance program for $10/month - Will match patient for first month for $3. Spoke with patient at the bedside using Language Line interpreter [name]). Patient agreeable to cost. Advised of hospital f/u appointment at [facility] 8/19 - patient verbalized understanding. Patient advised that IV antibiotic to be provided at dialysis center. TOC following for transition needs.   Expected Discharge Plan: Home/Self Care Barriers to Discharge: Continued Medical Work up  Expected Discharge Plan and Services Expected Discharge Plan: Home/Self Care   Discharge Planning Services: CM Consult Post Acute Care Choice: NA Living arrangements for the past 2 months: Single Family Home Expected Discharge Date: 12/08/19               DME Arranged: N/A DME Agency: NA       HH Arranged: NA HH Agency: NA         Social Determinants of Health (SDOH) Interventions    Readmission Risk Interventions Readmission Risk Prevention Plan 09/03/2018  PCP or Specialist Appt within 3-5 Days Complete  Some recent data might be hidden

## 2019-12-10 ENCOUNTER — Encounter (HOSPITAL_COMMUNITY): Payer: Self-pay | Admitting: Certified Registered Nurse Anesthetist

## 2019-12-10 ENCOUNTER — Encounter (HOSPITAL_COMMUNITY): Payer: Self-pay | Admitting: Family Medicine

## 2019-12-10 LAB — RENAL FUNCTION PANEL
Albumin: 3.3 g/dL — ABNORMAL LOW (ref 3.5–5.0)
Anion gap: 17 — ABNORMAL HIGH (ref 5–15)
BUN: 52 mg/dL — ABNORMAL HIGH (ref 6–20)
CO2: 21 mmol/L — ABNORMAL LOW (ref 22–32)
Calcium: 9.9 mg/dL (ref 8.9–10.3)
Chloride: 97 mmol/L — ABNORMAL LOW (ref 98–111)
Creatinine, Ser: 10.62 mg/dL — ABNORMAL HIGH (ref 0.61–1.24)
GFR calc Af Amer: 6 mL/min — ABNORMAL LOW (ref >60)
GFR calc non Af Amer: 5 mL/min — ABNORMAL LOW (ref >60)
Glucose, Bld: 155 mg/dL — ABNORMAL HIGH (ref 70–99)
Phosphorus: 7.3 mg/dL — ABNORMAL HIGH (ref 2.5–4.6)
Potassium: 4.7 mmol/L (ref 3.5–5.1)
Sodium: 135 mmol/L (ref 135–145)

## 2019-12-10 LAB — GLUCOSE, CAPILLARY
Glucose-Capillary: 108 mg/dL — ABNORMAL HIGH (ref 70–99)
Glucose-Capillary: 119 mg/dL — ABNORMAL HIGH (ref 70–99)

## 2019-12-10 MED ORDER — LIDOCAINE-PRILOCAINE 2.5-2.5 % EX CREA: 1.0000 "application " | TOPICAL_CREAM | CUTANEOUS | Status: DC | PRN

## 2019-12-10 MED ORDER — PENTAFLUOROPROP-TETRAFLUOROETH EX AERO: 1.0000 "application " | INHALATION_SPRAY | CUTANEOUS | Status: DC | PRN

## 2019-12-10 MED ORDER — ALTEPLASE 2 MG IJ SOLR: 2.0000 mg | Freq: Once | INTRAMUSCULAR | Status: DC | PRN

## 2019-12-10 MED ORDER — VANCOMYCIN HCL 500 MG/100ML IV SOLN: 500.0000 mg | INTRAVENOUS | 0 refills | Status: DC

## 2019-12-10 MED ORDER — HEPARIN SODIUM (PORCINE) 1000 UNIT/ML DIALYSIS: 1000.0000 [IU] | INTRAMUSCULAR | Status: DC | PRN

## 2019-12-10 MED ORDER — LIDOCAINE HCL (PF) 1 % IJ SOLN: 5.0000 mL | INTRAMUSCULAR | Status: DC | PRN

## 2019-12-10 MED ORDER — MIDODRINE HCL 10 MG PO TABS: 10.0000 mg | ORAL_TABLET | Freq: Three times a day (TID) | ORAL | 3 refills | Status: DC

## 2019-12-10 MED ORDER — SODIUM CHLORIDE 0.9 % IV SOLN: 100.0000 mL | INTRAVENOUS | Status: DC | PRN

## 2019-12-10 MED ORDER — HEPARIN SODIUM (PORCINE) 1000 UNIT/ML DIALYSIS: 20.0000 [IU]/kg | INTRAMUSCULAR | Status: DC | PRN

## 2019-12-10 NOTE — Progress Notes (Signed)
      INFECTIOUS DISEASE ATTENDING ADDENDUM:   Date: 12/10/2019  Patient name: Alvin Daniels  Medical record number: [id number]  Date of birth: Sep 23, 1970    Discussed the case with Dr. Tyrell Antonio The patient has had blood pressures that been fairly low and there is been anxiety about them going even lower during a transesophageal echocardiogram.  It would be nice if he could get access to long-term midodrine which he has been unable to afford.  We will plan on treating him with 6 weeks of vancomycin with hemodialysis.  The fact that he only grew the MRSA from 1 out of 2 blood cultures makes me less anxious about need for transesophageal echocardiogram.   Marsden Tuckey has an appointment on 12/28/2019 with Terri Piedra NP at [facility] for Infectious Disease is located in the [facility] at  [place] in [place].  [address], which is located to the left of the elevators.  Phone: [phone]  Fax: [phone]  [email or web address]    Rhina Brackett [place] 12/10/2019, 4:13 PM

## 2019-12-10 NOTE — Evaluation (Signed)
Physical Therapy Evaluation Patient Details Name: Alvin Daniels MRN: [id number] DOB: 09/03/1970 Today's Date: 12/10/2019   History of Present Illness  49 y.o. male with medical history significant of DM2, HIV on HAART, ESRD on MWF dialysis (last on 23rd during hospital stay), BLE amputations. Pt was just admitted from 7/22-7/23 with cellulitis of his R BKA residual limb.  Pt was put on vancomycin during admission.  Cellulitis was significantly improved, Dr. Sharol Given felt it didn't need surgery, and pt discharged on 7/23 on Keflex as outpt per ID recommendations as his cellulitis was none to minimal and nonpurulent. Found to be positive with MRSA, readmitted 7/24.  Clinical Impression   Pt presents with [facility] strength, balance, and mobility this day, pt mobilizing well with single LLE prosthesis. Per pt, he feels he is moving at baseline, and will have assist of close friend for ADLs and errands as needed. PT with no follow up recommendations at this time. PT to sign off, please reconsult if needed.     Follow Up Recommendations No PT follow up    Equipment Recommendations  None recommended by PT    Recommendations for Other Services       Precautions / Restrictions Precautions Precautions: Fall Precaution Comments: L prosthesis in room, R prosthesis at home and not being used for wound to heal Restrictions Weight Bearing Restrictions: No      Mobility  Bed Mobility Overal bed mobility: Independent Bed Mobility: Supine to Sit;Sit to Supine     Supine to sit: Independent Sit to supine: Independent   General bed mobility comments: no assist, performs in [employer] time  Transfers Overall transfer level: Needs assistance Equipment used: Rolling walker (2 wheeled) Transfers: Sit to/from Stand Sit to Stand: Modified independent (Device/Increase time)         General transfer comment: for safety only, no physical assist  Ambulation/Gait Ambulation/Gait assistance:  Supervision;Modified independent (Device/Increase time) Gait Distance (Feet): 25 Feet Assistive device: Rolling walker (2 wheeled) Gait Pattern/deviations: Step-to pattern Gait velocity: WFL   General Gait Details: Hop-to gait, WFL speed and steadiness during gait. Good use of RW for mobility  Stairs            Wheelchair Mobility    Modified Rankin (Stroke Patients Only)       Balance Overall balance assessment: Needs assistance   Sitting balance-Leahy Scale: Normal     Standing balance support: Bilateral upper extremity supported;During functional activity Standing balance-Leahy Scale: Poor Standing balance comment: reliant on RW for standing balance, unable to use R prosthesis right now                             Pertinent Vitals/Pain Pain Assessment: No/denies pain    Home Living Family/patient expects to be discharged to:: Private residence Living Arrangements: Alone Available Help at Discharge: Family;Available PRN/intermittently (close friend who has been helping him for 10 years) Type of Home: Apartment Home Access: Level entry     Home Layout: One level Home Equipment: Walker - 2 wheels;Shower seat;Wheelchair - manual      Prior Function Level of Independence: Independent with assistive device(s)         Comments: pt reports walking with RW at home, using w/c for longer distances. Pt takes bus to HD, and friends take him to the grocery store/to other errands     Hand Dominance   Dominant Hand: Right    Extremity/Trunk Assessment   Upper Extremity Assessment  Upper Extremity Assessment: Overall WFL for tasks assessed    Lower Extremity Assessment Lower Extremity Assessment: Overall WFL for tasks assessed;RLE deficits/detail;LLE deficits/detail RLE Deficits / Details: lateral residual limb sore, healing. BKA LLE Deficits / Details: BKA    Cervical / Trunk Assessment Cervical / Trunk Assessment: Normal  Communication    Communication: Prefers language other than English (Spanish - Dalia #[id number])  Cognition Arousal/Alertness: Awake/alert Behavior During Therapy: WFL for tasks assessed/performed Overall Cognitive Status: Within Functional Limits for tasks assessed                                        General Comments      Exercises     Assessment/Plan    PT Assessment Patent does not need any further PT services  PT Problem List         PT Treatment Interventions      PT Goals (Current goals can be found in the Care Plan section)  Acute Rehab PT Goals Patient Stated Goal: go home today PT Goal Formulation: With patient Time For Goal Achievement: 12/10/19 Potential to Achieve Goals: Good    Frequency     Barriers to discharge        Co-evaluation               AM-PAC PT "6 Clicks" Mobility  Outcome Measure Help needed turning from your back to your side while in a flat bed without using bedrails?: None Help needed moving from lying on your back to sitting on the side of a flat bed without using bedrails?: None Help needed moving to and from a bed to a chair (including a wheelchair)?: None Help needed standing up from a chair using your arms (e.g., wheelchair or bedside chair)?: None Help needed to walk in hospital room?: None Help needed climbing 3-5 steps with a railing? : A Lot 6 Click Score: 22    End of Session   Activity Tolerance: Patient tolerated treatment well Patient left: in bed;with call bell/phone within reach   PT Visit Diagnosis: Other abnormalities of gait and mobility (R26.89)    Time: 1040-1055 PT Time Calculation (min) (ACUTE ONLY): 15 min   Charges:   PT Evaluation $PT Eval Low Complexity: 1 Low         Benetta Maclaren E, PT Acute Rehabilitation Services Pager [phone]  Office [phone]  Amare Bail D Chantal Worthey 12/10/2019, 11:06 AM

## 2019-12-10 NOTE — Progress Notes (Signed)
[facility] Progress Note   Dialysis Orders: MWF NW 4h 50.0 kg edw 400/ 2/2 bath LUE AVF Hep 2000  hect56mcg  Holding Mircera 60 last on 10/27/19 With hgb 11.3 On 12/01/19  Assessment/Plan: 1.MRSA bacteremia- recurrent bacteremia. Etiology unclear. ID following. Plan for Vanc with HD.TEE cancelled andd/t hypotension.Plan 6 week course of IV Vanc 2. ESRD- on HD MWF.Next HD 7/30.  Can be completed at OP if no longer admitted. K 4.4. 3. Anemiaof CKD-Hgb 11.5. No indication for ESA at this time. 4. Secondary hyperparathyroidism- Last CCa 10.5, VDRAdecreased, use 2Ca bath. Phos high. Continue binders.Marland Kitchen 5.Hypotension/volume- Chronic hypotension, asymptomatic.  Started on midodrine.  Appreciate CM assistance in procuring affordable midodrine  Tolerates as OP to run on HD with SBP 70s per Dr. Lorrene Reid standing order. Does not appear volume overloaded.  Minimal UF as tolerated.  6. Nutrition- Renal diet w/fluid restrictions.  7.R BKA cellulitis- recent admit 7/22-7/23. D/c on Keflex. Per surgery did not require surgical intervention. On Vanc 8.DMT2per admit 9. HIV - per ID  Myriam Jacobson, PA-C [facility] Beeper [phone] 12/10/2019,8:16 AM  LOS: 5 days   Subjective:   Wants to go home.  Ate part of breakfast.   Objective Vitals:   12/09/19 1400 12/09/19 1647 12/09/19 2141 12/10/19 0456  BP: 112/65 (!) 89/57 (!) 93/61 (!) 93/60  Pulse:  86 81 78  Resp:  18 18 18   Temp:  98.9 F (37.2 C) 98.3 F (36.8 C) 98.3 F (36.8 C)  TempSrc:  Oral  Oral  SpO2:  97% 98% 99%  Weight:       Physical Exam General: NAD watching TV Heart: RRR Lungs: no rales Abdomen: soft NT Extremities: no LLE BKA edema - hard scabbed area on right lateral knee Dialysis Access: lefty upper AVF + bruit   Additional Objective Labs: Basic Metabolic Panel: Recent Labs  Lab 12/05/19 0042 12/06/19 0759 12/08/19 1102  NA 134* 134* 139  K  3.8 4.4 4.7  CL 95* 96* 99  CO2 22 21* 22  GLUCOSE 245* 183* 109*  BUN 31* 49* 43*  CREATININE 8.26* 11.85* 10.15*  CALCIUM 9.3 9.3 9.9  PHOS  --  7.0*  --    Liver Function Tests: Recent Labs  Lab 12/06/19 0759  ALBUMIN 2.5*   No results for input(s): LIPASE, AMYLASE in the last 168 hours. CBC: Recent Labs  Lab 12/04/19 1521 12/04/19 1521 12/05/19 0042 12/05/19 0042 12/06/19 0230 12/08/19 1102 12/09/19 0806  WBC 10.3   < > 9.1   < > 8.0 7.7 9.2  NEUTROABS  --   --   --   --  4.6  --   --   HGB 10.9*   < > 10.9*   < > 11.1* 11.3* 11.5*  HCT 33.9*   < > 32.6*   < > 32.9* 34.2* 35.1*  MCV 102.7*  --  101.2*  --  100.0 102.1* 102.9*  PLT 208   < > 219   < > 240 294 317   < > = values in this interval not displayed.   Blood Culture    Component Value Date/Time   SDES BLOOD RIGHT ANTECUBITAL 12/04/2019 1930   SPECREQUEST  12/04/2019 1930    BOTTLES DRAWN AEROBIC AND ANAEROBIC Blood Culture adequate volume   CULT  12/04/2019 1930    NO GROWTH 5 DAYS Performed at [facility], [place] [address], [place], [place] [address]    REPTSTATUS 12/09/2019 FINAL  12/04/2019 1930    Cardiac Enzymes: No results for input(s): CKTOTAL, CKMB, CKMBINDEX, TROPONINI in the last 168 hours. CBG: Recent Labs  Lab 12/09/19 0704 12/09/19 1110 12/09/19 1647 12/09/19 2138 12/10/19 0646  GLUCAP 129* 103* 167* 112* 108*   Iron Studies: No results for input(s): IRON, TIBC, TRANSFERRIN, FERRITIN in the last 72 hours. Lab Results  Component Value Date   INR 1.1 09/27/2019   INR 1.27 08/16/2016   INR 1.03 05/28/2016   Studies/Results: No results found. Medications: . sodium chloride 20 mL/hr at 12/09/19 0827  . vancomycin     . bictegravir-emtricitabine-tenofovir AF  1 tablet Oral Daily  . Chlorhexidine Gluconate Cloth  6 each Topical Q0600  . heparin  5,000 Units Subcutaneous Q8H  . insulin aspart  0-9 Units Subcutaneous TID WC  . insulin NPH Human  12 Units Subcutaneous BID  AC & HS  . midodrine  10 mg Oral TID WC

## 2019-12-10 NOTE — Discharge Summary (Signed)
Physician Discharge Summary  Alvin Daniels LKG:[id number] DOB: Aug 18, 1970 DOA: 12/04/2019  PCP: Patient, No Pcp Per  Admit date: 12/04/2019 Discharge date: 12/10/2019  Admitted From: Home  Disposition: Home   Recommendations for Outpatient Follow-up:  1. Follow up with PCP in 1-2 weeks 2. Please obtain BMP/CBC in one week 3. Need 6 weeks of IV vancomycin with HD>  4. Follow up with ID.   Home Health: none  Discharge Condition: Stable.  CODE STATUS: Full code Diet recommendation: Renal Diet   Brief/Interim Summary: 49 year old with past medical history significant for diabetes type 2, HIV on HAART, ESRD on M WF dialysis, bilateral below-knee amputation.  Patient was just admitted from 7/20 20278/23 with cellulitis of his right BKA stump.  Patient received vancomycin during admission.  His cellulitis was significantly improved, Dr. Sharol Given felt he did not need surgery.  Patient was discharged on 7/23 on Keflex.  Since discharge 1 out of 2 bottles of blood cultures  came back with positive for MRSA.  Patient was informed of the result and he presented for admission.   1-MRSA bacteremia/right stump cellulitis: -Continue with IV vancomycin per ID recommendation. -Transthoracic echo negative for vegetation. -Patient was supposed to have TEE - but due to low blood pressure test was canceled twice.  -Started on midodrine for hypotension.  - Discussed with ID and nephrology due to chronic low blood pressure and risk for TEE will proceed with 6 weeks treatment of IV vancomycin with HD>    2-ESRD on hemodialysis -Had HD 7/28  3-Diabetes type 2: Continue with Novolin.  4-HIV: Follow-up with ID.  Continue with Biktarvi.  5-Hypotension: chronic Lactic acid normal, CBC, globin stable BP lower today after HD yesterday.  Discussed with nephrology plan to increase midodrine to 10 mg.  CM to help with medication assistance.  BP better on 10 mg of midodrine.   Discharge Diagnoses:   Principal Problem:   MRSA bacteremia Active Problems:   Diabetes mellitus with ESRD (end-stage renal disease) ([place])   Cellulitis of right lower extremity   ESRD on hemodialysis ([place])   HIV disease ([place])   HIV (human immunodeficiency virus infection) ([place])   Bacteremia   Amputation stump infection [facility])    Discharge Instructions  Discharge Instructions    Diet - low sodium heart healthy   Complete by: As directed    Discharge wound care:   Complete by: As directed    See instructions   Increase activity slowly   Complete by: As directed      Allergies as of 12/10/2019   No Known Allergies     Medication List    STOP taking these medications   cephALEXin 500 MG capsule Commonly known as: KEFLEX     TAKE these medications   Biktarvy 50-200-25 MG Tabs tablet Generic drug: bictegravir-emtricitabine-tenofovir AF Take 1 tablet by mouth daily.   glucose blood test strip Commonly known as: True Metrix Blood Glucose Test 1 each by Other route 3 (three) times daily.   HumuLIN 70/30 (70-30) 100 UNIT/ML injection Generic drug: insulin NPH-regular Human Inject 15 Units into the skin 2 (two) times daily before a meal.   Insulin Syringe-Needle U-100 31G X 15/64" 0.5 ML Misc Commonly known as: BD Insulin Syringe Ultrafine 1 each by Does not apply route 2 (two) times daily. Please dispense needles as well.   midodrine 10 MG tablet Commonly known as: PROAMATINE Take 1 tablet (10 mg total) by mouth 3 (three) times daily with meals.   True  Metrix Meter Devi 1 each by Does not apply route 3 (three) times daily.   TRUEplus Lancets 28G Misc 1 each by Does not apply route 3 (three) times daily.   vancomycin 500 MG/100ML IVPB Commonly known as: VANCOREADY Inject 100 mLs (500 mg total) into the vein every Monday, Wednesday, and Friday with hemodialysis.            Discharge Care Instructions  (From admission, onward)         Start     Ordered   12/10/19 0000   Discharge wound care:       Comments: See instructions   12/10/19 1023          No Known Allergies  Consultations:  Nephrology    Procedures/Studies: DG Knee 2 Views Right  Result Date: 12/02/2019 CLINICAL DATA:  Right leg wound. EXAM: RIGHT KNEE - 1-2 VIEW COMPARISON:  12/01/2019.  MRI 10/05/2010. FINDINGS: Prior BKA. Medial compartment degenerative change. No acute bony abnormality identified. No bony erosions noted. Peripheral vascular calcification. No radiopaque foreign body noted. IMPRESSION: 1. Prior BKA. Mild degenerative changes medial compartment. No acute or focal bony abnormality is identified. 2.  Peripheral vascular disease.  No radiopaque foreign body noted. Electronically Signed   By: Marcello Moores  Register   On: 12/02/2019 04:48   MR KNEE RIGHT WO CONTRAST  Result Date: 12/02/2019 CLINICAL DATA:  Pain and swelling. EXAM: MRI OF THE RIGHT KNEE WITHOUT CONTRAST TECHNIQUE: Multiplanar, multisequence MR imaging of the knee was performed. No intravenous contrast was administered. COMPARISON:  Radiographs 12/02/2019 FINDINGS: Evidence of prior below-knee amputation. The osteotomy site of the tibia and fibula appear normal. No findings suspicious for osteomyelitis. Fluid noted around the distal aspect of the tibial stump is likely adventitial bursa formation. Diffuse subcutaneous soft tissue swelling/edema/fluid mainly along the lateral and posterior aspect of the knee and tibia suggesting cellulitis. There is also moderate edema like signal abnormality in the upper calf musculature suggesting nonspecific myositis. No evidence of pyomyositis. There is also fatty atrophy of the muscles. No MR findings suspicious for septic arthritis at the knee joint. Small joint effusion. Normal appearing articular cartilage. No findings for internal derangement. IMPRESSION: 1. Prior below-knee amputation. No findings suspicious for osteomyelitis involving the tibia or fibula or septic arthritis at the knee  joint. 2. Diffuse subcutaneous soft tissue swelling/edema/fluid mainly along the lateral and posterior aspect of the knee and tibia suggesting cellulitis. 3. Moderate edema like signal abnormality in the upper calf musculature suggesting nonspecific myositis. No evidence of pyomyositis. 4. Small fluid collection around the tibial stump could be adventitial bursa formation. Electronically Signed   By: Marijo Sanes M.D.   On: 12/02/2019 07:27   DG Hip Unilat  With Pelvis 2-3 Views Right  Result Date: 12/01/2019 CLINICAL DATA:  Pain EXAM: RIGHT FEMUR 1 VIEW; DG HIP (WITH OR WITHOUT PELVIS) 2-3V RIGHT COMPARISON:  None. FINDINGS: Single view radiograph of the pelvis and two view radiograph of the right hip demonstrate normal alignment. No fracture or dislocation. No destructive osseous lesion. The femoral head is normally developed and well seated within the right acetabulum. Intramedullary rod noted within the visualized proximal left femoral shaft. The limited evaluation of the left hip is otherwise unremarkable. Advanced vascular calcifications are seen within the pelvis and medial thighs. IMPRESSION: Negative. Electronically Signed   By: Fidela Salisbury MD   On: 12/01/2019 21:10   DG FEMUR 1V RIGHT  Result Date: 12/01/2019 CLINICAL DATA:  Pain EXAM: RIGHT FEMUR 1  VIEW; DG HIP (WITH OR WITHOUT PELVIS) 2-3V RIGHT COMPARISON:  None. FINDINGS: Single view radiograph of the pelvis and two view radiograph of the right hip demonstrate normal alignment. No fracture or dislocation. No destructive osseous lesion. The femoral head is normally developed and well seated within the right acetabulum. Intramedullary rod noted within the visualized proximal left femoral shaft. The limited evaluation of the left hip is otherwise unremarkable. Advanced vascular calcifications are seen within the pelvis and medial thighs. IMPRESSION: Negative. Electronically Signed   By: Fidela Salisbury MD   On: 12/01/2019 21:10    ECHOCARDIOGRAM LIMITED  Result Date: 12/06/2019    ECHOCARDIOGRAM LIMITED REPORT   Patient Name:   Alvin Daniels Date of Exam: 12/06/2019 Medical Rec #:  [id number]              Height:       60.0 in Accession #:    [id number]             Weight:       110.4 lb Date of Birth:  [birth year]               BSA:          1.450 m Patient Age:    49 years               BP:           87/69 mmHg Patient Gender: M                      HR:           89 bpm. Exam Location:  Inpatient Procedure: 2D Echo and Color Doppler Indications:    Bacteremia R78.81  History:        Patient has prior history of Echocardiogram examinations, most                 recent 09/01/2019. Risk Factors:Diabetes.  Sonographer:    Raquel Sarna Senior RDCS Referring Phys: [id number] RAVI PAHWANI IMPRESSIONS  1. Left ventricular ejection fraction, by estimation, is 55 to 60%. The left ventricle has normal function.  2. Right ventricular systolic function is normal. The right ventricular size is normal. Tricuspid regurgitation signal is inadequate for assessing PA pressure.  3. The mitral valve is grossly normal. No evidence of mitral valve regurgitation. No evidence of mitral stenosis.  4. The aortic valve is tricuspid. Aortic valve regurgitation is not visualized. No aortic stenosis is present.  5. The inferior vena cava is normal in size with greater than 50% respiratory variability, suggesting right atrial pressure of 3 mmHg. Comparison(s): No significant change from prior study. Conclusion(s)/Recommendation(s): No evidence of valvular vegetations on this transthoracic echocardiogram. Would recommend a transesophageal echocardiogram to exclude infective endocarditis if clinically indicated. FINDINGS  Left Ventricle: Left ventricular ejection fraction, by estimation, is 55 to 60%. The left ventricle has normal function. Right Ventricle: The right ventricular size is normal. No increase in right ventricular wall thickness. Right ventricular systolic  function is normal. Tricuspid regurgitation signal is inadequate for assessing PA pressure. Pericardium: Trivial pericardial effusion is present. Mitral Valve: The mitral valve is grossly normal. No evidence of mitral valve stenosis. There is no evidence of mitral valve vegetation. Tricuspid Valve: The tricuspid valve is grossly normal. Tricuspid valve regurgitation is not demonstrated. No evidence of tricuspid stenosis. There is no evidence of tricuspid valve vegetation. Aortic Valve: The aortic valve is tricuspid. Aortic valve regurgitation is not visualized. No aortic stenosis  is present. There is no evidence of aortic valve vegetation. Pulmonic Valve: The pulmonic valve was grossly normal. Pulmonic valve regurgitation is not visualized. No evidence of pulmonic stenosis. There is no evidence of pulmonic valve vegetation. Venous: The inferior vena cava is normal in size with greater than 50% respiratory variability, suggesting right atrial pressure of 3 mmHg. Eleonore Chiquito MD Electronically signed by Eleonore Chiquito MD Signature Date/Time: 12/06/2019/5:22:00 PM    Final       Subjective: He is feeling well. He understand he needs 6 weeks of IV vancomycin.  He will follow with wellness clinic to establish care and get midodrine  Discharge Exam: Vitals:   12/10/19 0456 12/10/19 1017  BP: (!) 93/60 (!) 95/60  Pulse: 78 90  Resp: 18 20  Temp: 98.3 F (36.8 C) 98.1 F (36.7 C)  SpO2: 99% 98%     General: Pt is alert, awake, not in acute distress Cardiovascular: RRR, S1/S2 +, no rubs, no gallops Respiratory: CTA bilaterally, no wheezing, no rhonchi Abdominal: Soft, NT, ND, bowel sounds + Extremities: no edema, no cyanosis    The results of significant diagnostics from this hospitalization (including imaging, microbiology, ancillary and laboratory) are listed below for reference.     Microbiology: Recent Results (from the past 240 hour(s))  Blood culture (routine x 2)     Status: None    Collection Time: 12/02/19  3:43 AM   Specimen: BLOOD RIGHT FOREARM  Result Value Ref Range Status   Specimen Description   Final    BLOOD RIGHT FOREARM Performed at [facility], [address] [place], [place] [address]    Special Requests   Final    BOTTLES DRAWN AEROBIC AND ANAEROBIC Blood Culture adequate volume Performed at [facility] [address] [place], [place] [address]    Culture   Final    NO GROWTH 5 DAYS Performed at [facility], [place] [address] [place], [place] [address]    Report Status 12/07/2019 FINAL  Final  Blood culture (routine x 2)     Status: Abnormal   Collection Time: 12/02/19  3:44 AM   Specimen: BLOOD LEFT HAND  Result Value Ref Range Status   Specimen Description   Final    BLOOD LEFT HAND Performed at [facility] [address], [place], [place] [address]    Special Requests   Final    BOTTLES DRAWN AEROBIC AND ANAEROBIC Blood Culture adequate volume Performed at [facility] [address], [place], [place] [address]    Culture  Setup Time   Final    GRAM POSITIVE COCCI IN CLUSTERS ANAEROBIC BOTTLE ONLY CRITICAL RESULT CALLED TO, READ BACK BY AND VERIFIED WITH: DR Doristine Bosworth [id number] [id number] AT 784 AM BY CM Performed at [facility], [place] [address] [place], [place] [address]    Culture METHICILLIN RESISTANT STAPHYLOCOCCUS AUREUS (A)  Final   Report Status 12/05/2019 FINAL  Final   Organism ID, Bacteria METHICILLIN RESISTANT STAPHYLOCOCCUS AUREUS  Final      Susceptibility   Methicillin resistant staphylococcus aureus - MIC*    CIPROFLOXACIN <=0.5 SENSITIVE Sensitive     ERYTHROMYCIN >=8 RESISTANT Resistant     GENTAMICIN <=0.5 SENSITIVE Sensitive     OXACILLIN >=4 RESISTANT Resistant     TETRACYCLINE >=16 RESISTANT Resistant     VANCOMYCIN <=0.5 SENSITIVE Sensitive     TRIMETH/SULFA <=10 SENSITIVE Sensitive     CLINDAMYCIN <=0.25 SENSITIVE Sensitive  RIFAMPIN  <=0.5 SENSITIVE Sensitive     Inducible Clindamycin NEGATIVE Sensitive     * METHICILLIN RESISTANT STAPHYLOCOCCUS AUREUS  Blood Culture ID Panel (Reflexed)     Status: Abnormal   Collection Time: 12/02/19  3:44 AM  Result Value Ref Range Status   Enterococcus species NOT DETECTED NOT DETECTED Final   Listeria monocytogenes NOT DETECTED NOT DETECTED Final   Staphylococcus species DETECTED (A) NOT DETECTED Final    Comment: CRITICAL RESULT CALLED TO, READ BACK BY AND VERIFIED WITH: DR Doristine Bosworth [id number] AT 835 AM BY CM    Staphylococcus aureus (BCID) DETECTED (A) NOT DETECTED Final    Comment: Methicillin (oxacillin)-resistant Staphylococcus aureus (MRSA). MRSA is predictably resistant to beta-lactam antibiotics (except ceftaroline). Preferred therapy is vancomycin unless clinically contraindicated. Patient requires contact precautions if  hospitalized. CRITICAL RESULT CALLED TO, READ BACK BY AND VERIFIED WITH: DR Loann Quill [id number] AT 835 BY CM    Methicillin resistance DETECTED (A) NOT DETECTED Final    Comment: CRITICAL RESULT CALLED TO, READ BACK BY AND VERIFIED WITH: DR Doristine Bosworth R [id number] AT 835 AM BY CM    Streptococcus species NOT DETECTED NOT DETECTED Final   Streptococcus agalactiae NOT DETECTED NOT DETECTED Final   Streptococcus pneumoniae NOT DETECTED NOT DETECTED Final   Streptococcus pyogenes NOT DETECTED NOT DETECTED Final   Acinetobacter baumannii NOT DETECTED NOT DETECTED Final   Enterobacteriaceae species NOT DETECTED NOT DETECTED Final   Enterobacter cloacae complex NOT DETECTED NOT DETECTED Final   Escherichia coli NOT DETECTED NOT DETECTED Final   Klebsiella oxytoca NOT DETECTED NOT DETECTED Final   Klebsiella pneumoniae NOT DETECTED NOT DETECTED Final   Proteus species NOT DETECTED NOT DETECTED Final   Serratia marcescens NOT DETECTED NOT DETECTED Final   Haemophilus influenzae NOT DETECTED NOT DETECTED Final   Neisseria meningitidis NOT DETECTED NOT DETECTED Final    Pseudomonas aeruginosa NOT DETECTED NOT DETECTED Final   Candida albicans NOT DETECTED NOT DETECTED Final   Candida glabrata NOT DETECTED NOT DETECTED Final   Candida krusei NOT DETECTED NOT DETECTED Final   Candida parapsilosis NOT DETECTED NOT DETECTED Final   Candida tropicalis NOT DETECTED NOT DETECTED Final    Comment: Performed at [facility], [address], [place], [place] [address]  SARS Coronavirus 2 by RT PCR (hospital order, performed in [facility] hospital lab) Nasopharyngeal Nasopharyngeal Swab     Status: None   Collection Time: 12/02/19  6:53 AM   Specimen: Nasopharyngeal Swab  Result Value Ref Range Status   SARS Coronavirus 2 NEGATIVE NEGATIVE Final    Comment: (NOTE) SARS-CoV-2 target nucleic acids are NOT DETECTED.  The SARS-CoV-2 RNA is generally detectable in upper and lower respiratory specimens during the acute phase of infection. The lowest concentration of SARS-CoV-2 viral copies this assay can detect is 250 copies / mL. A negative result does not preclude SARS-CoV-2 infection and should not be used as the sole basis for treatment or other patient management decisions.  A negative result may occur with improper specimen collection / handling, submission of specimen other than nasopharyngeal swab, presence of viral mutation(s) within the areas targeted by this assay, and inadequate number of viral copies (<250 copies / mL). A negative result must be combined with clinical observations, patient history, and epidemiological information.  Fact Sheet for Patients:   StrictlyIdeas.no  Fact Sheet for Healthcare Providers: BankingDealers.co.za  This test is not yet approved or  cleared by the [place] FDA and has  been authorized for detection and/or diagnosis of SARS-CoV-2 by FDA under an Emergency Use Authorization (EUA).  This EUA will remain in effect (meaning this test can be used) for the duration  of the COVID-19 declaration under Section 564(b)(1) of the Act, 21 U.S.C. section 360bbb-3(b)(1), unless the authorization is terminated or revoked sooner.  Performed at [facility], [place] [address] [place], [place] [address]   MRSA PCR Screening     Status: Abnormal   Collection Time: 12/02/19 10:57 PM   Specimen: Nasopharyngeal  Result Value Ref Range Status   MRSA by PCR POSITIVE (A) NEGATIVE Final    Comment:        The GeneXpert MRSA Assay (FDA approved for NASAL specimens only), is one component of a comprehensive MRSA colonization surveillance program. It is not intended to diagnose MRSA infection nor to guide or monitor treatment for MRSA infections. RESULT CALLED TO, READ BACK BY AND VERIFIED WITHPhil Dopp RN 12/03/19 0056 JDW Performed at [facility], [place] [address] [place], [place] [address]   Culture, blood (routine x 2)     Status: None   Collection Time: 12/04/19  7:25 PM   Specimen: BLOOD RIGHT HAND  Result Value Ref Range Status   Specimen Description BLOOD RIGHT HAND  Final   Special Requests   Final    BOTTLES DRAWN AEROBIC AND ANAEROBIC Blood Culture adequate volume   Culture   Final    NO GROWTH 5 DAYS Performed at [facility], [place] [address] [place], [place] [address]    Report Status 12/09/2019 FINAL  Final  Culture, blood (routine x 2)     Status: None   Collection Time: 12/04/19  7:30 PM   Specimen: BLOOD  Result Value Ref Range Status   Specimen Description BLOOD RIGHT ANTECUBITAL  Final   Special Requests   Final    BOTTLES DRAWN AEROBIC AND ANAEROBIC Blood Culture adequate volume   Culture   Final    NO GROWTH 5 DAYS Performed at [facility], [place] [address] [place], [place] [address]    Report Status 12/09/2019 FINAL  Final  SARS Coronavirus 2 by RT PCR (hospital order, performed in [facility] hospital lab) Nasopharyngeal Nasopharyngeal Swab     Status: None   Collection Time: 12/04/19  8:50  PM   Specimen: Nasopharyngeal Swab  Result Value Ref Range Status   SARS Coronavirus 2 NEGATIVE NEGATIVE Final    Comment: (NOTE) SARS-CoV-2 target nucleic acids are NOT DETECTED.  The SARS-CoV-2 RNA is generally detectable in upper and lower respiratory specimens during the acute phase of infection. The lowest concentration of SARS-CoV-2 viral copies this assay can detect is 250 copies / mL. A negative result does not preclude SARS-CoV-2 infection and should not be used as the sole basis for treatment or other patient management decisions.  A negative result may occur with improper specimen collection / handling, submission of specimen other than nasopharyngeal swab, presence of viral mutation(s) within the areas targeted by this assay, and inadequate number of viral copies (<250 copies / mL). A negative result must be combined with clinical observations, patient history, and epidemiological information.  Fact Sheet for Patients:   StrictlyIdeas.no  Fact Sheet for Healthcare Providers: BankingDealers.co.za  This test is not yet approved or  cleared by the [place] FDA and has been authorized for detection and/or diagnosis of SARS-CoV-2 by FDA under an Emergency Use Authorization (EUA).  This EUA will  remain in effect (meaning this test can be used) for the duration of the COVID-19 declaration under Section 564(b)(1) of the Act, 21 U.S.C. section 360bbb-3(b)(1), unless the authorization is terminated or revoked sooner.  Performed at [facility], [place] [address], [place], [place] [address]      Labs: BNP (last 3 results) No results for input(s): BNP in the last 8760 hours. Basic Metabolic Panel: Recent Labs  Lab 12/04/19 1521 12/05/19 0042 12/06/19 0759 12/08/19 1102  NA 134* 134* 134* 139  K 3.7 3.8 4.4 4.7  CL 93* 95* 96* 99  CO2 26 22 21* 22  GLUCOSE 189* 245* 183* 109*  BUN 27* 31* 49* 43*  CREATININE  6.96* 8.26* 11.85* 10.15*  CALCIUM 9.6 9.3 9.3 9.9  PHOS  --   --  7.0*  --    Liver Function Tests: Recent Labs  Lab 12/06/19 0759  ALBUMIN 2.5*   No results for input(s): LIPASE, AMYLASE in the last 168 hours. No results for input(s): AMMONIA in the last 168 hours. CBC: Recent Labs  Lab 12/04/19 1521 12/05/19 0042 12/06/19 0230 12/08/19 1102 12/09/19 0806  WBC 10.3 9.1 8.0 7.7 9.2  NEUTROABS  --   --  4.6  --   --   HGB 10.9* 10.9* 11.1* 11.3* 11.5*  HCT 33.9* 32.6* 32.9* 34.2* 35.1*  MCV 102.7* 101.2* 100.0 102.1* 102.9*  PLT 208 219 240 294 317   Cardiac Enzymes: No results for input(s): CKTOTAL, CKMB, CKMBINDEX, TROPONINI in the last 168 hours. BNP: Invalid input(s): POCBNP CBG: Recent Labs  Lab 12/09/19 0704 12/09/19 1110 12/09/19 1647 12/09/19 2138 12/10/19 0646  GLUCAP 129* 103* 167* 112* 108*   D-Dimer No results for input(s): DDIMER in the last 72 hours. Hgb A1c No results for input(s): HGBA1C in the last 72 hours. Lipid Profile No results for input(s): CHOL, HDL, LDLCALC, TRIG, CHOLHDL, LDLDIRECT in the last 72 hours. Thyroid function studies No results for input(s): TSH, T4TOTAL, T3FREE, THYROIDAB in the last 72 hours.  Invalid input(s): FREET3 Anemia work up No results for input(s): VITAMINB12, FOLATE, FERRITIN, TIBC, IRON, RETICCTPCT in the last 72 hours. Urinalysis    Component Value Date/Time   COLORURINE YELLOW 07/04/2010 1840   APPEARANCEUR TURBID (A) 07/04/2010 1840   LABSPEC 1.013 07/04/2010 1840   PHURINE 7.0 07/04/2010 1840   GLUCOSEU NEG mg/dL 07/04/2010 1840   HGBUR TRACE (A) 03/13/2010 1824   BILIRUBINUR NEG 07/04/2010 1840   KETONESUR NEG mg/dL 07/04/2010 1840   PROTEINUR > 300 mg/dL (A) 07/04/2010 1840   UROBILINOGEN 0.2 07/04/2010 1840   NITRITE NEG 07/04/2010 1840   LEUKOCYTESUR LARGE (A) 07/04/2010 1840   Sepsis Labs Invalid input(s): PROCALCITONIN,  WBC,  LACTICIDVEN Microbiology Recent Results (from the past 240  hour(s))  Blood culture (routine x 2)     Status: None   Collection Time: 12/02/19  3:43 AM   Specimen: BLOOD RIGHT FOREARM  Result Value Ref Range Status   Specimen Description   Final    BLOOD RIGHT FOREARM Performed at [facility], [place] [address], [place], [place] [address]    Special Requests   Final    BOTTLES DRAWN AEROBIC AND ANAEROBIC Blood Culture adequate volume Performed at [facility] [address] [place], [place] [address]    Culture   Final    NO GROWTH 5 DAYS Performed at [facility], [place] [address] [place], [place] [address]    Report Status 12/07/2019  FINAL  Final  Blood culture (routine x 2)     Status: Abnormal   Collection Time: 12/02/19  3:44 AM   Specimen: BLOOD LEFT HAND  Result Value Ref Range Status   Specimen Description   Final    BLOOD LEFT HAND Performed at [facility] [address], [place], [place] [address]    Special Requests   Final    BOTTLES DRAWN AEROBIC AND ANAEROBIC Blood Culture adequate volume Performed at [facility] [address], [place], [place] [address]    Culture  Setup Time   Final    GRAM POSITIVE COCCI IN CLUSTERS ANAEROBIC BOTTLE ONLY CRITICAL RESULT CALLED TO, READ BACK BY AND VERIFIED WITH: DR Doristine Bosworth RAVI [id number] AT 440 AM BY CM Performed at [facility], [place] [address], [place], [place] [address]    Culture METHICILLIN RESISTANT STAPHYLOCOCCUS AUREUS (A)  Final   Report Status 12/05/2019 FINAL  Final   Organism ID, Bacteria METHICILLIN RESISTANT STAPHYLOCOCCUS AUREUS  Final      Susceptibility   Methicillin resistant staphylococcus aureus - MIC*    CIPROFLOXACIN <=0.5 SENSITIVE Sensitive     ERYTHROMYCIN >=8 RESISTANT Resistant     GENTAMICIN <=0.5 SENSITIVE Sensitive     OXACILLIN >=4 RESISTANT Resistant     TETRACYCLINE >=16 RESISTANT Resistant     VANCOMYCIN <=0.5 SENSITIVE Sensitive     TRIMETH/SULFA <=10 SENSITIVE Sensitive      CLINDAMYCIN <=0.25 SENSITIVE Sensitive     RIFAMPIN <=0.5 SENSITIVE Sensitive     Inducible Clindamycin NEGATIVE Sensitive     * METHICILLIN RESISTANT STAPHYLOCOCCUS AUREUS  Blood Culture ID Panel (Reflexed)     Status: Abnormal   Collection Time: 12/02/19  3:44 AM  Result Value Ref Range Status   Enterococcus species NOT DETECTED NOT DETECTED Final   Listeria monocytogenes NOT DETECTED NOT DETECTED Final   Staphylococcus species DETECTED (A) NOT DETECTED Final    Comment: CRITICAL RESULT CALLED TO, READ BACK BY AND VERIFIED WITH: DR Doristine Bosworth R [id number] AT 835 AM BY CM    Staphylococcus aureus (BCID) DETECTED (A) NOT DETECTED Final    Comment: Methicillin (oxacillin)-resistant Staphylococcus aureus (MRSA). MRSA is predictably resistant to beta-lactam antibiotics (except ceftaroline). Preferred therapy is vancomycin unless clinically contraindicated. Patient requires contact precautions if  hospitalized. CRITICAL RESULT CALLED TO, READ BACK BY AND VERIFIED WITH: DR Loann Quill [id number] AT 835 BY CM    Methicillin resistance DETECTED (A) NOT DETECTED Final    Comment: CRITICAL RESULT CALLED TO, READ BACK BY AND VERIFIED WITH: DR Doristine Bosworth R [id number] AT 55 AM BY CM    Streptococcus species NOT DETECTED NOT DETECTED Final   Streptococcus agalactiae NOT DETECTED NOT DETECTED Final   Streptococcus pneumoniae NOT DETECTED NOT DETECTED Final   Streptococcus pyogenes NOT DETECTED NOT DETECTED Final   Acinetobacter baumannii NOT DETECTED NOT DETECTED Final   Enterobacteriaceae species NOT DETECTED NOT DETECTED Final   Enterobacter cloacae complex NOT DETECTED NOT DETECTED Final   Escherichia coli NOT DETECTED NOT DETECTED Final   Klebsiella oxytoca NOT DETECTED NOT DETECTED Final   Klebsiella pneumoniae NOT DETECTED NOT DETECTED Final   Proteus species NOT DETECTED NOT DETECTED Final   Serratia marcescens NOT DETECTED NOT DETECTED Final   Haemophilus influenzae NOT DETECTED NOT DETECTED Final    Neisseria meningitidis NOT DETECTED NOT DETECTED Final   Pseudomonas aeruginosa NOT DETECTED NOT DETECTED Final   Candida albicans NOT DETECTED NOT DETECTED Final   Candida  glabrata NOT DETECTED NOT DETECTED Final   Candida krusei NOT DETECTED NOT DETECTED Final   Candida parapsilosis NOT DETECTED NOT DETECTED Final   Candida tropicalis NOT DETECTED NOT DETECTED Final    Comment: Performed at [facility], [place] [address], [place], [place] [address]  SARS Coronavirus 2 by RT PCR (hospital order, performed in [facility] hospital lab) Nasopharyngeal Nasopharyngeal Swab     Status: None   Collection Time: 12/02/19  6:53 AM   Specimen: Nasopharyngeal Swab  Result Value Ref Range Status   SARS Coronavirus 2 NEGATIVE NEGATIVE Final    Comment: (NOTE) SARS-CoV-2 target nucleic acids are NOT DETECTED.  The SARS-CoV-2 RNA is generally detectable in upper and lower respiratory specimens during the acute phase of infection. The lowest concentration of SARS-CoV-2 viral copies this assay can detect is 250 copies / mL. A negative result does not preclude SARS-CoV-2 infection and should not be used as the sole basis for treatment or other patient management decisions.  A negative result may occur with improper specimen collection / handling, submission of specimen other than nasopharyngeal swab, presence of viral mutation(s) within the areas targeted by this assay, and inadequate number of viral copies (<250 copies / mL). A negative result must be combined with clinical observations, patient history, and epidemiological information.  Fact Sheet for Patients:   StrictlyIdeas.no  Fact Sheet for Healthcare Providers: BankingDealers.co.za  This test is not yet approved or  cleared by the [place] FDA and has been authorized for detection and/or diagnosis of SARS-CoV-2 by FDA under an Emergency Use Authorization (EUA).  This EUA will remain in  effect (meaning this test can be used) for the duration of the COVID-19 declaration under Section 564(b)(1) of the Act, 21 U.S.C. section 360bbb-3(b)(1), unless the authorization is terminated or revoked sooner.  Performed at [facility], [place] [address] [place], [place] [address]   MRSA PCR Screening     Status: Abnormal   Collection Time: 12/02/19 10:57 PM   Specimen: Nasopharyngeal  Result Value Ref Range Status   MRSA by PCR POSITIVE (A) NEGATIVE Final    Comment:        The GeneXpert MRSA Assay (FDA approved for NASAL specimens only), is one component of a comprehensive MRSA colonization surveillance program. It is not intended to diagnose MRSA infection nor to guide or monitor treatment for MRSA infections. RESULT CALLED TO, READ BACK BY AND VERIFIED WITHPhil Dopp RN 12/03/19 0056 JDW Performed at [facility], [place] [address], [place], [place] [address]   Culture, blood (routine x 2)     Status: None   Collection Time: 12/04/19  7:25 PM   Specimen: BLOOD RIGHT HAND  Result Value Ref Range Status   Specimen Description BLOOD RIGHT HAND  Final   Special Requests   Final    BOTTLES DRAWN AEROBIC AND ANAEROBIC Blood Culture adequate volume   Culture   Final    NO GROWTH 5 DAYS Performed at [facility], [place] [address] [place], [place] [address]    Report Status 12/09/2019 FINAL  Final  Culture, blood (routine x 2)     Status: None   Collection Time: 12/04/19  7:30 PM   Specimen: BLOOD  Result Value Ref Range Status   Specimen Description BLOOD RIGHT ANTECUBITAL  Final   Special Requests   Final    BOTTLES DRAWN AEROBIC AND ANAEROBIC Blood Culture adequate volume   Culture   Final  NO GROWTH 5 DAYS Performed at [facility], [place] [address] [place], [place] [address]    Report Status 12/09/2019 FINAL  Final  SARS Coronavirus 2 by RT PCR (hospital order, performed in [facility] hospital lab) Nasopharyngeal Nasopharyngeal  Swab     Status: None   Collection Time: 12/04/19  8:50 PM   Specimen: Nasopharyngeal Swab  Result Value Ref Range Status   SARS Coronavirus 2 NEGATIVE NEGATIVE Final    Comment: (NOTE) SARS-CoV-2 target nucleic acids are NOT DETECTED.  The SARS-CoV-2 RNA is generally detectable in upper and lower respiratory specimens during the acute phase of infection. The lowest concentration of SARS-CoV-2 viral copies this assay can detect is 250 copies / mL. A negative result does not preclude SARS-CoV-2 infection and should not be used as the sole basis for treatment or other patient management decisions.  A negative result may occur with improper specimen collection / handling, submission of specimen other than nasopharyngeal swab, presence of viral mutation(s) within the areas targeted by this assay, and inadequate number of viral copies (<250 copies / mL). A negative result must be combined with clinical observations, patient history, and epidemiological information.  Fact Sheet for Patients:   StrictlyIdeas.no  Fact Sheet for Healthcare Providers: BankingDealers.co.za  This test is not yet approved or  cleared by the [place] FDA and has been authorized for detection and/or diagnosis of SARS-CoV-2 by FDA under an Emergency Use Authorization (EUA).  This EUA will remain in effect (meaning this test can be used) for the duration of the COVID-19 declaration under Section 564(b)(1) of the Act, 21 U.S.C. section 360bbb-3(b)(1), unless the authorization is terminated or revoked sooner.  Performed at [facility], [place] [address], [place],  [address]      Time coordinating discharge: 40 minutes  SIGNED:   Elmarie Shiley, MD  [facility]

## 2019-12-10 NOTE — Progress Notes (Signed)
DISCHARGE NOTE HOME Morton Dolinsky to be discharged Home per MD order. Discussed prescriptions and follow up appointments with the patient via interpreter. Prescriptions given to patient; medication list explained in detail. Patient verbalized understanding.  IV catheter discontinued intact. Site without signs and symptoms of complications. Dressing and pressure applied. Pt denies pain at the site currently. No complaints noted.   An After Visit Summary (AVS) was printed and given to the patient. Patient escorted via wheelchair, and discharged home via private auto.  Orville Govern, RN3

## 2019-12-13 DIAGNOSIS — L03113 Cellulitis of right upper limb: Secondary | ICD-10-CM | POA: Insufficient documentation

## 2019-12-13 SURGERY — ECHOCARDIOGRAM, TRANSESOPHAGEAL
Anesthesia: Monitor Anesthesia Care

## 2019-12-28 ENCOUNTER — Ambulatory Visit (INDEPENDENT_AMBULATORY_CARE_PROVIDER_SITE_OTHER): Payer: Self-pay | Admitting: Family

## 2019-12-28 ENCOUNTER — Encounter: Payer: Self-pay | Admitting: Family

## 2019-12-28 ENCOUNTER — Telehealth: Payer: Self-pay | Admitting: *Deleted

## 2019-12-28 ENCOUNTER — Ambulatory Visit: Payer: Self-pay

## 2019-12-28 ENCOUNTER — Other Ambulatory Visit: Payer: Self-pay

## 2019-12-28 VITALS — BP 91/55 | HR 92 | Temp 98.1°F | Wt 118.0 lb

## 2019-12-28 DIAGNOSIS — B20 Human immunodeficiency virus [HIV] disease: Secondary | ICD-10-CM

## 2019-12-28 DIAGNOSIS — R7881 Bacteremia: Secondary | ICD-10-CM

## 2019-12-28 DIAGNOSIS — T8740 Infection of amputation stump, unspecified extremity: Secondary | ICD-10-CM

## 2019-12-28 DIAGNOSIS — B9562 Methicillin resistant Staphylococcus aureus infection as the cause of diseases classified elsewhere: Secondary | ICD-10-CM

## 2019-12-28 NOTE — Telephone Encounter (Signed)
Per Alvin Piedra, NP, relayed verbal orders with new stop date of 9/4 for vancomycin after dialysis to Alvin Daniels, nurse at [facility]. Order repeated and verified.  Patient relayed to Alvin Daniels that he doesn't think he gets vancomycin after each session. Per Alvin Amsler, RN asked for confirmation that patient has been receiving vancomycin as ordered after each dialysis session, but Alvin Daniels was unable to open patient's chart until he is on the schedule for dialysis session. She will be able to access this information tomorrow and asked for this RN to call her then. Alvin Daniels, Alvin Schwab, RN

## 2019-12-28 NOTE — Assessment & Plan Note (Addendum)
Mr. Neal is feeling much better since leaving the hospital and continues to receive vancomycin with dialysis with possible missed dose per his recollection.  Source of infection likely being right stump infection.  TTE was without evidence of endocarditis and unable to complete TEE due to hypotension.  We will contact dialysis center to ensure continued vancomycin through end date of 9/4.  Plan for follow-up in 3 weeks or sooner if needed.

## 2019-12-28 NOTE — Assessment & Plan Note (Signed)
Alvin Daniels is a 49 year old male with well-controlled HIV disease with good adherence and tolerance to his ART regimen of Biktarvy.  Most recent viral load remains undetectable and he has no signs/symptoms of opportunistic infection or progressive HIV disease.  Continue current dose of Biktarvy.  Financial assistance renewed today.  Continue routine follow-up with Dr. Tommy Medal.

## 2019-12-28 NOTE — Progress Notes (Signed)
Subjective:    Patient ID: Alvin Daniels, male    DOB: July 02, 1970, 49 y.o.   MRN: [id number]  Chief Complaint  Patient presents with  . HIV Positive/AIDS  . Bacteremia     HPI:  Alvin Daniels is a 49 y.o. male with ESRD on hemodialysis and HIV disease who was recently seen in the hospital for MRSA bacteremia. Source of infection appeared to be his right BKA stump with previous concern for wound related to poorly fitting prosthesis. Blood cultures cleared quickly and TTE was negative for endocarditis. TEE was scheduled and ended up being deferred with recommendation for treatment being 6 weeks of vancomycin with dialysis. He was previously seen in the office on 06/09/19 with good adherence and tolerance of his ART regimen of Biktarvy. Here today for hospitalization follow up. Alvin Daniels preferred primary language is Spans[place] and a medical interpreter is present via tablet.   Alvin Daniels has been receiving his vancomycin with dialysis on most days and believes he may have missed a day but is not sure. Overall feeling well today with no new concerns/complaints. Feeling much better since leaving the hospital. Wound on his right BKA has dried and is continuing to heal. Denies pain, fever, or chills. Continues to take his Biktarvy daily with no adverse side effects or missed doses. Has no problems obtaining his medication from the pharmacy.   No Known Allergies    Outpatient Medications Prior to Visit  Medication Sig Dispense Refill  . bictegravir-emtricitabine-tenofovir AF (BIKTARVY) 50-200-25 MG TABS tablet Take 1 tablet by mouth daily. 30 tablet 11  . Blood Glucose Monitoring Suppl (TRUE METRIX METER) DEVI 1 each by Does not apply route 3 (three) times daily. 1 Device 0  . glucose blood (TRUE METRIX BLOOD GLUCOSE TEST) test strip 1 each by Other route 3 (three) times daily. 100 each 12  . midodrine (PROAMATINE) 10 MG tablet Take 1 tablet (10 mg total) by mouth 3 (three)  times daily with meals. 90 tablet 3  . vancomycin (VANCOREADY) 500 MG/100ML IVPB Inject 100 mLs (500 mg total) into the vein every Monday, Wednesday, and Friday with hemodialysis. 4000 mL 0  . insulin NPH-regular Human (HUMULIN 70/30) (70-30) 100 UNIT/ML injection Inject 15 Units into the skin 2 (two) times daily before a meal.     . Insulin Syringe-Needle U-100 (BD INSULIN SYRINGE ULTRAFINE) 31G X 15/64" 0.5 ML MISC 1 each by Does not apply route 2 (two) times daily. Please dispense needles as well. 60 each 5  . TRUEplus Lancets 28G MISC 1 each by Does not apply route 3 (three) times daily. 100 each 11   No facility-administered medications prior to visit.     Past Medical History:  Diagnosis Date  . AIDS ([place]) 11/22/2014  . Back pain 06/09/2019  . BKA stump complication ([place]) 2/50/5397  . Chronic diarrhea   . Chronic hepatitis C without hepatic coma ([place]) 11/22/2014  . 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 Diabetic neuropathy ([place])   . ESRD (end stage renal disease) on dialysis [facility])    "TTS; don't remember street name" (05/03/2014)  . Hepatitis C   . HIV INFECTION 06/27/2010   Qualifier: Diagnosis of  By: Nickola Major CMA ( [place]), Geni Bers    . Hypotension 06/02/2012  . Metabolic bone disease 10/17/3417  . MRSA infection   . Normocytic anemia 06/17/2012  . Pancreatitis   . Pressure ulcer of BKA stump, stage 2 ([place]) 11/22/2014  . Renal disorder   .  Severe protein-calorie malnutrition ([place]) 06/17/2012  . Uncontrolled diabetes mellitus with complications ([place]) 2/99/2426   Annotation: uncontrolled Qualifier: Diagnosis of  By: Nickola Major CMA Deborra Medina), Geni Bers       Past Surgical History:  Procedure Laterality Date  . AMPUTATION Left 04/20/2014   Procedure: 3rd toe amputation, 4th Toe Amputation,  5th Toe Amputation;  Surgeon: Newt Minion, MD;  Location: [facility];  Service: Orthopedics;  Laterality: Left;  . AMPUTATION Left 05/02/2014   Procedure: Midfoot Amputation;  Surgeon:  Newt Minion, MD;  Location: [facility];  Service: Orthopedics;  Laterality: Left;  . AMPUTATION Left 06/17/2014   Procedure: AMPUTATION BELOW KNEE;  Surgeon: Newt Minion, MD;  Location: [facility];  Service: Orthopedics;  Laterality: Left;  . AMPUTATION Right 09/04/2018   Procedure: RIGHT BELOW KNEE AMPUTATION;  Surgeon: Newt Minion, MD;  Location: [facility];  Service: Orthopedics;  Laterality: Right;  RIGHT BELOW KNEE AMPUTATION  . AV FISTULA PLACEMENT Left   . AV FISTULA PLACEMENT Left 05/10/2016   Procedure: Creation Left Arm Brachiocephalic Arteriovenous Fistula and Ligation of Radiocephalic Fistula;  Surgeon: Angelia Mould, MD;  Location: [facility];  Service: Vascular;  Laterality: Left;  . FEMUR IM NAIL Left 08/17/2016   Procedure: INTRAMEDULLARY (IM) RETROGRADE FEMORAL NAILING;  Surgeon: Marybelle Killings, MD;  Location: [facility];  Service: Orthopedics;  Laterality: Left;  . FOOT AMPUTATION THROUGH ANKLE Left 12/'21/2015   midfoot  . IR GENERIC HISTORICAL Left 05/01/2016   IR THROMBECTOMY AV FISTULA W/THROMBOLYSIS/PTA INC/SHUNT/IMG LEFT 05/01/2016 [name], MD [facility]  . IR GENERIC HISTORICAL  05/01/2016   IR US GUIDE VASC ACCESS LEFT 05/01/2016 [name], MD [facility]  . IR GENERIC HISTORICAL  05/07/2016   IR FLUORO GUIDE CV LINE RIGHT 05/07/2016 Corrie Mckusick, DO [facility]  . IR GENERIC HISTORICAL  05/07/2016   IR US GUIDE VASC ACCESS RIGHT 05/07/2016 Corrie Mckusick, DO [facility]  . IR GENERIC HISTORICAL  05/22/2016   IR US GUIDE VASC ACCESS RIGHT 05/22/2016 Greggory Keen, MD [facility]  . IR GENERIC HISTORICAL  05/22/2016   IR FLUORO GUIDE CV LINE RIGHT 05/22/2016 Greggory Keen, MD [facility]  . IR REMOVAL TUN CV CATH W/O FL  08/21/2016  . PERIPHERAL VASCULAR CATHETERIZATION Left 05/09/2016   Procedure: A/V Fistulagram;  Surgeon: Angelia Mould, MD;  Location: [facility];  Service: Cardiovascular;  Laterality: Left;  arm  . TEE WITHOUT CARDIOVERSION N/A  06/22/2018   Procedure: TRANSESOPHAGEAL ECHOCARDIOGRAM (TEE);  Surgeon: Jerline Pain, MD;  Location: [facility] ENDOSCOPY;  Service: Cardiovascular;  Laterality: N/A;       Review of Systems  Constitutional: Negative for appetite change, chills, fatigue, fever and unexpected weight change.  Eyes: Negative for visual disturbance.  Respiratory: Negative for cough, chest tightness, shortness of breath and wheezing.   Cardiovascular: Negative for chest pain and leg swelling.  Gastrointestinal: Negative for abdominal pain, constipation, diarrhea, nausea and vomiting.  Genitourinary: Negative for dysuria, flank pain, frequency, genital sores, hematuria and urgency.  Skin: Negative for rash.  Allergic/Immunologic: Negative for immunocompromised state.  Neurological: Negative for dizziness and headaches.      Objective:    BP (!) 91/55   Pulse 92   Temp 98.1 F (36.7 C) (Oral)   Wt 118 lb (53.5 kg) Comment: fully clothed with jacket and artificial limbs/shoes  SpO2 100%   BMI 23.05 kg/m  Nursing note and vital signs reviewed.  Physical Exam Constitutional:  General: He is not in acute distress.    Appearance: He is well-developed.  Eyes:     Conjunctiva/sclera: Conjunctivae normal.  Cardiovascular:     Rate and Rhythm: Normal rate and regular rhythm.     Heart sounds: Normal heart sounds. No murmur heard.  No friction rub. No gallop.   Pulmonary:     Effort: Pulmonary effort is normal. No respiratory distress.     Breath sounds: Normal breath sounds. No wheezing or rales.  Chest:     Chest wall: No tenderness.  Abdominal:     General: Bowel sounds are normal.     Palpations: Abdomen is soft.     Tenderness: There is no abdominal tenderness.  Musculoskeletal:     Cervical back: Neck supple.  Lymphadenopathy:     Cervical: No cervical adenopathy.  Skin:    General: Skin is warm and dry.     Findings: No rash.  Neurological:     Mental Status: He is alert and oriented to  person, place, and time.  Psychiatric:        Behavior: Behavior normal.        Thought Content: Thought content normal.        Judgment: Judgment normal.      Depression screen [facility] 2/9 12/28/2019 07/28/2018 11/19/2017 01/09/2017 06/07/2015  Decreased Interest 0 0 0 0 0  Down, Depressed, Hopeless 0 0 0 0 0  PHQ - 2 Score 0 0 0 0 0  Some recent data might be hidden       Assessment & Plan:    Patient Active Problem List   Diagnosis Date Noted  . Amputation stump infection ([place]) 12/02/2019  . Scapular fracture 09/28/2019  . COVID-19 virus infection 09/28/2019  . Chest pain 09/27/2019  . Hx MRSA infection 09/21/2019  . BKA stump complication ([place]) 49/70/2637  . Back pain 06/09/2019  . Abscess of foot   . Abscess of ankle 08/29/2018  . Charcot foot due to diabetes mellitus ([place]) 07/23/2018  . MRSA bacteremia   . Bacteremia 06/18/2018  . Diabetic foot ulcer ([place]) 11/19/2017  . Femur fracture, left ([place]) 08/16/2016  . HIV (human immunodeficiency virus infection) ([place]) 08/16/2016  . GERD (gastroesophageal reflux disease) 05/24/2016  . Burn 04/10/2016  . Pressure ulcer of BKA stump, stage 2 ([place]) 11/22/2014  . Chronic hepatitis C without hepatic coma ([place]) 11/22/2014  . S/P bilateral BKA (below knee amputation) ([place]) 06/17/2014  . HIV disease ([place])   . Poor dentition 10/25/2013  . ESRD on hemodialysis ([place]) 10/25/2013  . Vision changes 06/21/2013  . Orthostatic headache 04/15/2013  . Anemia of chronic renal failure 06/19/2012  . Metabolic bone disease 85/88/5027  . Chronic diarrhea 06/17/2012  . Severe protein-calorie malnutrition ([place]) 06/17/2012  . Normocytic anemia 06/17/2012  . Cellulitis of right lower extremity 10/19/2010  . Polyneuropathy in diabetes(357.2) 06/27/2010  . Diabetes mellitus with ESRD (end-stage renal disease) ([place]) 01/03/2010     Problem List Items Addressed This Visit      Other   HIV disease ([place]) (Chronic)    Mr. Busk is a 49 year old male  with well-controlled HIV disease with good adherence and tolerance to his ART regimen of Biktarvy.  Most recent viral load remains undetectable and he has no signs/symptoms of opportunistic infection or progressive HIV disease.  Continue current dose of Biktarvy.  Financial assistance renewed today.  Continue routine follow-up with Dr. Tommy Medal.      MRSA bacteremia    Mr. Vandiver  is feeling much better since leaving the hospital and continues to receive vancomycin with dialysis with possible missed dose per his recollection.  Source of infection likely being right stump infection.  TTE was without evidence of endocarditis and unable to complete TEE due to hypotension.  We will contact dialysis center to ensure continued vancomycin through end date of 9/4.  Plan for follow-up in 3 weeks or sooner if needed.      Amputation stump infection ([place])    Mr. Depaola's stump is healing well with no current drainage and is now dry.  Able to wear his prosthetic without problems.  Continue basic wound care and continue to monitor.          I am having Federick Molstad maintain his TRUEplus Lancets 28G, True Metrix Meter, glucose blood, Insulin Syringe-Needle U-100, Biktarvy, insulin NPH-regular Human, midodrine, and vancomycin.   No orders of the defined types were placed in this encounter.    Follow-up: Return in about 3 weeks (around 01/18/2020), or if symptoms worsen or fail to improve.   Terri Piedra, MSN, FNP-C Nurse Practitioner [facility] [place] number: [phone]

## 2019-12-28 NOTE — Assessment & Plan Note (Signed)
Alvin Daniels is healing well with no current drainage and is now dry.  Able to wear his prosthetic without problems.  Continue basic wound care and continue to monitor.

## 2019-12-28 NOTE — Telephone Encounter (Signed)
Next time I am going to send him out on oral antibiotics this is a travesty that he is treated so poorly if he is not getting IV abx

## 2019-12-28 NOTE — Patient Instructions (Signed)
Nice to see you.  We will continue your vancomycin through 9/4 and make sure the dialysis center is aware.  Continue to take your [place] daily as prescribed.   Plan for follow up around 9/4 with Dr. Tommy Medal for routine HIV care and end of MRSA bacteremia treatment.  Have a great day and stay safe!

## 2019-12-29 ENCOUNTER — Encounter: Payer: Self-pay | Admitting: Infectious Disease

## 2019-12-30 ENCOUNTER — Ambulatory Visit: Payer: Self-pay | Attending: Physician Assistant | Admitting: Physician Assistant

## 2020-01-12 ENCOUNTER — Ambulatory Visit: Payer: Self-pay | Admitting: Infectious Disease

## 2020-02-03 ENCOUNTER — Telehealth: Payer: Self-pay

## 2020-02-03 NOTE — Telephone Encounter (Signed)
Patient requesting referral for Opthalmology states he has had blurry vision and pain in eyes. For "awhile" states its getting worse with dialysis. Will forward message to MD to advise on refill. Will call Audelia Hives once referral is entered.  [place]

## 2020-02-08 ENCOUNTER — Other Ambulatory Visit: Payer: Self-pay | Admitting: Infectious Disease

## 2020-02-08 DIAGNOSIS — H538 Other visual disturbances: Secondary | ICD-10-CM

## 2020-02-08 NOTE — Progress Notes (Signed)
l °

## 2020-02-08 NOTE — Telephone Encounter (Signed)
Referral put in. I am going to be out of office and NOT checking epic consistently for next 2 weeks. Dr. [place] Bali is covering my inbox (unless there are any IT issues)

## 2020-02-10 ENCOUNTER — Telehealth: Payer: Self-pay | Admitting: General Practice

## 2020-02-10 ENCOUNTER — Telehealth: Payer: Self-pay

## 2020-02-10 ENCOUNTER — Other Ambulatory Visit: Payer: Self-pay | Admitting: Family Medicine

## 2020-02-10 DIAGNOSIS — E1122 Type 2 diabetes mellitus with diabetic chronic kidney disease: Secondary | ICD-10-CM

## 2020-02-10 MED ORDER — "INSULIN SYRINGE-NEEDLE U-100 31G X 15/64"" 0.5 ML MISC": 1.0000 | Freq: Two times a day (BID) | 5 refills | Status: DC

## 2020-02-10 MED ORDER — HUMULIN 70/30 (70-30) 100 UNIT/ML ~~LOC~~ SUSP: 15.0000 [IU] | Freq: Two times a day (BID) | SUBCUTANEOUS | 0 refills | Status: DC

## 2020-02-10 NOTE — Telephone Encounter (Signed)
Rx sent to our pharmacy.

## 2020-02-10 NOTE — Telephone Encounter (Signed)
Nurse from [facility] called saying that patient reached out to them and needs a refill on his insulin. Nurse would like to know if patient can get some insulin until he comes in to his appt on 10/14. Patient had to cancel the appt for 10/4 because he has dialysis.

## 2020-02-10 NOTE — Telephone Encounter (Signed)
Patient reached out to [facility], [facility] research requesting assistance on getting refills on Insulin. States that PCP will not refill until he is seen. Was scheduled to see PCP on 10/4, but unable to keep visit due to dialysis.  Rescheduled for 10/14.  Called PCP office regarding refill. Office will contact provider to advise on refill.

## 2020-02-14 ENCOUNTER — Ambulatory Visit: Payer: Self-pay | Admitting: Nurse Practitioner

## 2020-02-24 ENCOUNTER — Ambulatory Visit: Payer: Self-pay | Admitting: Family Medicine

## 2020-02-27 ENCOUNTER — Other Ambulatory Visit: Payer: Self-pay

## 2020-02-27 ENCOUNTER — Emergency Department (HOSPITAL_COMMUNITY): Payer: Self-pay

## 2020-02-27 ENCOUNTER — Emergency Department (HOSPITAL_COMMUNITY)
Admission: EM | Admit: 2020-02-27 | Discharge: 2020-02-27 | Disposition: A | Discharge status: Home / Self Care | Payer: Self-pay | Attending: Emergency Medicine | Admitting: Emergency Medicine

## 2020-02-27 DIAGNOSIS — E1140 Type 2 diabetes mellitus with diabetic neuropathy, unspecified: Secondary | ICD-10-CM | POA: Insufficient documentation

## 2020-02-27 DIAGNOSIS — Z20822 Contact with and (suspected) exposure to covid-19: Secondary | ICD-10-CM | POA: Insufficient documentation

## 2020-02-27 DIAGNOSIS — Z794 Long term (current) use of insulin: Secondary | ICD-10-CM | POA: Insufficient documentation

## 2020-02-27 DIAGNOSIS — H9201 Otalgia, right ear: Secondary | ICD-10-CM | POA: Insufficient documentation

## 2020-02-27 DIAGNOSIS — N186 End stage renal disease: Secondary | ICD-10-CM | POA: Insufficient documentation

## 2020-02-27 DIAGNOSIS — R059 Cough, unspecified: Secondary | ICD-10-CM | POA: Insufficient documentation

## 2020-02-27 DIAGNOSIS — Z8616 Personal history of COVID-19: Secondary | ICD-10-CM | POA: Insufficient documentation

## 2020-02-27 DIAGNOSIS — E1122 Type 2 diabetes mellitus with diabetic chronic kidney disease: Secondary | ICD-10-CM | POA: Insufficient documentation

## 2020-02-27 DIAGNOSIS — E1142 Type 2 diabetes mellitus with diabetic polyneuropathy: Secondary | ICD-10-CM | POA: Insufficient documentation

## 2020-02-27 DIAGNOSIS — Z992 Dependence on renal dialysis: Secondary | ICD-10-CM | POA: Insufficient documentation

## 2020-02-27 DIAGNOSIS — R079 Chest pain, unspecified: Secondary | ICD-10-CM | POA: Insufficient documentation

## 2020-02-27 DIAGNOSIS — R070 Pain in throat: Secondary | ICD-10-CM | POA: Insufficient documentation

## 2020-02-27 DIAGNOSIS — J029 Acute pharyngitis, unspecified: Secondary | ICD-10-CM

## 2020-02-27 DIAGNOSIS — J40 Bronchitis, not specified as acute or chronic: Secondary | ICD-10-CM

## 2020-02-27 DIAGNOSIS — E11621 Type 2 diabetes mellitus with foot ulcer: Secondary | ICD-10-CM | POA: Insufficient documentation

## 2020-02-27 DIAGNOSIS — L539 Erythematous condition, unspecified: Secondary | ICD-10-CM | POA: Insufficient documentation

## 2020-02-27 DIAGNOSIS — L97509 Non-pressure chronic ulcer of other part of unspecified foot with unspecified severity: Secondary | ICD-10-CM | POA: Insufficient documentation

## 2020-02-27 LAB — CBC
HCT: 29.2 % — ABNORMAL LOW (ref 39.0–52.0)
Hemoglobin: 10.2 g/dL — ABNORMAL LOW (ref 13.0–17.0)
MCH: 34.2 pg — ABNORMAL HIGH (ref 26.0–34.0)
MCHC: 34.9 g/dL (ref 30.0–36.0)
MCV: 98 fL (ref 80.0–100.0)
Platelets: 160 10*3/uL (ref 150–400)
RBC: 2.98 MIL/uL — ABNORMAL LOW (ref 4.22–5.81)
RDW: 12.6 % (ref 11.5–15.5)
WBC: 12 10*3/uL — ABNORMAL HIGH (ref 4.0–10.5)
nRBC: 0 % (ref 0.0–0.2)

## 2020-02-27 LAB — RESPIRATORY PANEL BY RT PCR (FLU A&B, COVID)
Influenza A by PCR: NEGATIVE
Influenza B by PCR: NEGATIVE
SARS Coronavirus 2 by RT PCR: NEGATIVE

## 2020-02-27 LAB — BASIC METABOLIC PANEL
Anion gap: 17 — ABNORMAL HIGH (ref 5–15)
BUN: 37 mg/dL — ABNORMAL HIGH (ref 6–20)
CO2: 25 mmol/L (ref 22–32)
Calcium: 9.4 mg/dL (ref 8.9–10.3)
Chloride: 94 mmol/L — ABNORMAL LOW (ref 98–111)
Creatinine, Ser: 9.54 mg/dL — ABNORMAL HIGH (ref 0.61–1.24)
GFR, Estimated: 6 mL/min — ABNORMAL LOW (ref >60)
Glucose, Bld: 145 mg/dL — ABNORMAL HIGH (ref 70–99)
Potassium: 4.5 mmol/L (ref 3.5–5.1)
Sodium: 136 mmol/L (ref 135–145)

## 2020-02-27 LAB — TROPONIN I (HIGH SENSITIVITY): Troponin I (High Sensitivity): 7 ng/L (ref <18)

## 2020-02-27 LAB — GROUP A STREP BY PCR: Group A Strep by PCR: NOT DETECTED

## 2020-02-27 MED ORDER — ACETAMINOPHEN 500 MG PO TABS
1000.0000 mg | ORAL_TABLET | Freq: Once | ORAL | Status: AC
  Administered 2020-02-27: 1000 mg via ORAL
  Filled 2020-02-27: qty 2

## 2020-02-27 MED ORDER — AMOXICILLIN 500 MG PO CAPS: 500.0000 mg | ORAL_CAPSULE | Freq: Every day | ORAL | 0 refills | Status: DC

## 2020-02-27 MED ORDER — AMOXICILLIN 500 MG PO CAPS
500.0000 mg | ORAL_CAPSULE | Freq: Once | ORAL | Status: AC
  Administered 2020-02-27: 500 mg via ORAL
  Filled 2020-02-27: qty 1

## 2020-02-27 NOTE — Discharge Instructions (Addendum)
It was our pleasure to provide your ER care today - we hope that you feel better.  Take antibiotic as prescribed (on dialysis days, take the dose after dialysis).   Drink adequate fluids. Use throat lozenges as need for symptom relief.   Follow up with primary care doctor in 1 week if symptoms fail to improve/resolve.  Return to ER if worse, new symptoms, trouble breathing, unable to swallow, recurrent/persistent chest pain, high fevers, or other concern.

## 2020-02-27 NOTE — ED Provider Notes (Signed)
[facility] DEPT Provider Note   CSN: [id number] Arrival date & time: 02/27/20  1104     History Chief Complaint  Patient presents with  . Chest Pain    Alvin Daniels is a 49 y.o. male.  Patient with hx hiv, c/o non prod cough, sore throat, right ear pain, and right chest soreness/pain for the past 3-4 days. Symptoms acute onset, moderate, constant, persistent, dull, non radiating. Chest pain worse w certain movements, cough, and palpation. No sob. No nv or diaphoresis. Symptoms occur at rest. Not pleuritic. Hx bil leg amputations, no swelling or pain of stumps or arms. No abd pain or nvd. No known covid exposure, and did previously have covid several months ago.   The history is provided by the patient. A language interpreter was used.  Chest Pain Associated symptoms: cough   Associated symptoms: no abdominal pain, no back pain, no fever, no headache, no shortness of breath and no vomiting        Past Medical History:  Diagnosis Date  . AIDS ([place]) 11/22/2014  . Back pain 06/09/2019  . BKA stump complication ([place]) 11/11/6376  . Chronic diarrhea   . Chronic hepatitis C without hepatic coma ([place]) 11/22/2014  . 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 Diabetic neuropathy ([place])   . ESRD (end stage renal disease) on dialysis [facility])    "TTS; don't remember street name" (05/03/2014)  . Hepatitis C   . HIV INFECTION 06/27/2010   Qualifier: Diagnosis of  By: Nickola Major CMA ( [place]), Geni Bers    . Hypotension 06/02/2012  . Metabolic bone disease 09/17/8500  . MRSA infection   . Normocytic anemia 06/17/2012  . Pancreatitis   . Pressure ulcer of BKA stump, stage 2 ([place]) 11/22/2014  . Renal disorder   . Severe protein-calorie malnutrition ([place]) 06/17/2012  . Uncontrolled diabetes mellitus with complications ([place]) 7/74/1287   Annotation: uncontrolled Qualifier: Diagnosis of  By: Nickola Major CMA Deborra Medina), Jacqueline      Patient Active Problem List     Diagnosis Date Noted  . Amputation stump infection ([place]) 12/02/2019  . Scapular fracture 09/28/2019  . COVID-19 virus infection 09/28/2019  . Chest pain 09/27/2019  . Hx MRSA infection 09/21/2019  . BKA stump complication ([place]) 86/76/7209  . Back pain 06/09/2019  . Abscess of foot   . Abscess of ankle 08/29/2018  . Charcot foot due to diabetes mellitus ([place]) 07/23/2018  . MRSA bacteremia   . Bacteremia 06/18/2018  . Diabetic foot ulcer ([place]) 11/19/2017  . Femur fracture, left ([place]) 08/16/2016  . HIV (human immunodeficiency virus infection) ([place]) 08/16/2016  . GERD (gastroesophageal reflux disease) 05/24/2016  . Burn 04/10/2016  . Pressure ulcer of BKA stump, stage 2 ([place]) 11/22/2014  . Chronic hepatitis C without hepatic coma ([place]) 11/22/2014  . S/P bilateral BKA (below knee amputation) ([place]) 06/17/2014  . HIV disease ([place])   . Poor dentition 10/25/2013  . ESRD on hemodialysis ([place]) 10/25/2013  . Vision changes 06/21/2013  . Orthostatic headache 04/15/2013  . Anemia of chronic renal failure 06/19/2012  . Metabolic bone disease 47/01/6282  . Chronic diarrhea 06/17/2012  . Severe protein-calorie malnutrition ([place]) 06/17/2012  . Normocytic anemia 06/17/2012  . Cellulitis of right lower extremity 10/19/2010  . Polyneuropathy in diabetes(357.2) 06/27/2010  . Diabetes mellitus with ESRD (end-stage renal disease) ([place]) 01/03/2010    Past Surgical History:  Procedure Laterality Date  . AMPUTATION Left 04/20/2014   Procedure: 3rd toe amputation, 4th  Toe Amputation,  5th Toe Amputation;  Surgeon: Newt Minion, MD;  Location: [facility];  Service: Orthopedics;  Laterality: Left;  . AMPUTATION Left 05/02/2014   Procedure: Midfoot Amputation;  Surgeon: Newt Minion, MD;  Location: [facility];  Service: Orthopedics;  Laterality: Left;  . AMPUTATION Left 06/17/2014   Procedure: AMPUTATION BELOW KNEE;  Surgeon: Newt Minion, MD;  Location: [facility];  Service: Orthopedics;  Laterality: Left;  .  AMPUTATION Right 09/04/2018   Procedure: RIGHT BELOW KNEE AMPUTATION;  Surgeon: Newt Minion, MD;  Location: [facility];  Service: Orthopedics;  Laterality: Right;  RIGHT BELOW KNEE AMPUTATION  . AV FISTULA PLACEMENT Left   . AV FISTULA PLACEMENT Left 05/10/2016   Procedure: Creation Left Arm Brachiocephalic Arteriovenous Fistula and Ligation of Radiocephalic Fistula;  Surgeon: Angelia Mould, MD;  Location: [facility];  Service: Vascular;  Laterality: Left;  . FEMUR IM NAIL Left 08/17/2016   Procedure: INTRAMEDULLARY (IM) RETROGRADE FEMORAL NAILING;  Surgeon: Marybelle Killings, MD;  Location: [facility];  Service: Orthopedics;  Laterality: Left;  . FOOT AMPUTATION THROUGH ANKLE Left 12/'21/2015   midfoot  . IR GENERIC HISTORICAL Left 05/01/2016   IR THROMBECTOMY AV FISTULA W/THROMBOLYSIS/PTA INC/SHUNT/IMG LEFT 05/01/2016 [name], MD [facility]  . IR GENERIC HISTORICAL  05/01/2016   IR US GUIDE VASC ACCESS LEFT 05/01/2016 [name], MD [facility]  . IR GENERIC HISTORICAL  05/07/2016   IR FLUORO GUIDE CV LINE RIGHT 05/07/2016 Corrie Mckusick, DO [facility]  . IR GENERIC HISTORICAL  05/07/2016   IR US GUIDE VASC ACCESS RIGHT 05/07/2016 Corrie Mckusick, DO [facility]  . IR GENERIC HISTORICAL  05/22/2016   IR US GUIDE VASC ACCESS RIGHT 05/22/2016 Greggory Keen, MD [facility]  . IR GENERIC HISTORICAL  05/22/2016   IR FLUORO GUIDE CV LINE RIGHT 05/22/2016 Greggory Keen, MD [facility]  . IR REMOVAL TUN CV CATH W/O FL  08/21/2016  . PERIPHERAL VASCULAR CATHETERIZATION Left 05/09/2016   Procedure: A/V Fistulagram;  Surgeon: Angelia Mould, MD;  Location: [facility] CV LAB;  Service: Cardiovascular;  Laterality: Left;  arm  . TEE WITHOUT CARDIOVERSION N/A 06/22/2018   Procedure: TRANSESOPHAGEAL ECHOCARDIOGRAM (TEE);  Surgeon: Jerline Pain, MD;  Location: [facility] ENDOSCOPY;  Service: Cardiovascular;  Laterality: N/A;       Family History  Problem Relation Age of Onset  . Diabetes Mother    . Diabetes Father     Social History   Tobacco Use  . Smoking status: Never Smoker  . Smokeless tobacco: Never Used  Vaping Use  . Vaping Use: Never used  Substance Use Topics  . Alcohol use: No  . Drug use: No    Home Medications Prior to Admission medications   Medication Sig Start Date End Date Taking? Authorizing Provider  bictegravir-emtricitabine-tenofovir AF (BIKTARVY) 50-200-25 MG TABS tablet Take 1 tablet by mouth daily. 06/09/19   Truman Hayward, MD  Blood Glucose Monitoring Suppl (TRUE METRIX METER) DEVI 1 each by Does not apply route 3 (three) times daily. 09/15/18   [name], MD  glucose blood (TRUE METRIX BLOOD GLUCOSE TEST) test strip 1 each by Other route 3 (three) times daily. 09/15/18   [name], MD  insulin NPH-regular Human (HUMULIN 70/30) (70-30) 100 UNIT/ML injection Inject 15 Units into the skin 2 (two) times daily before a meal. 02/10/20   [name], MD  Insulin Syringe-Needle U-100 (BD INSULIN SYRINGE ULTRAFINE) 31G X 15/64" 0.5 ML MISC 1  each by Does not apply route 2 (two) times daily. 02/10/20   [name], MD  midodrine (PROAMATINE) 10 MG tablet Take 1 tablet (10 mg total) by mouth 3 (three) times daily with meals. 12/10/19   Regalado, Belkys A, MD  TRUEplus Lancets 28G MISC 1 each by Does not apply route 3 (three) times daily. 09/15/18   [name], MD  vancomycin (VANCOREADY) 500 MG/100ML IVPB Inject 100 mLs (500 mg total) into the vein every Monday, Wednesday, and Friday with hemodialysis. 12/10/19 06/07/20  Regalado, Cassie Freer, MD    Allergies    Patient has no known allergies.  Review of Systems   Review of Systems  Constitutional: Negative for fever.  HENT: Positive for sore throat.   Eyes: Negative for redness.  Respiratory: Positive for cough. Negative for shortness of breath.   Cardiovascular: Positive for chest pain.  Gastrointestinal: Negative for abdominal pain, diarrhea and vomiting.  Genitourinary: Negative  for dysuria and flank pain.  Musculoskeletal: Negative for back pain, neck pain and neck stiffness.  Skin: Negative for rash.  Neurological: Negative for headaches.  Hematological: Does not bruise/bleed easily.  Psychiatric/Behavioral: Negative for confusion.    Physical Exam Updated Vital Signs BP 105/68 (BP Location: Right Arm)   Pulse 88   Temp 98.1 F (36.7 C) (Oral)   Resp 18   SpO2 100%   Physical Exam Vitals and nursing note reviewed.  Constitutional:      Appearance: Normal appearance. He is well-developed.  HENT:     Head: Atraumatic.     Right Ear: Tympanic membrane and ear canal normal.     Left Ear: Tympanic membrane and ear canal normal.     Nose: Nose normal.     Mouth/Throat:     Mouth: Mucous membranes are moist.     Pharynx: Oropharynx is clear. Posterior oropharyngeal erythema present.     Comments: No asymmetric swelling or abscess. No trouble swallowing or handling secretions. No oral thrush noted.  Eyes:     General: No scleral icterus.    Conjunctiva/sclera: Conjunctivae normal.     Pupils: Pupils are equal, round, and reactive to light.  Neck:     Trachea: No tracheal deviation.     Comments: No stiffness or rigidity.  Cardiovascular:     Rate and Rhythm: Normal rate and regular rhythm.     Pulses: Normal pulses.     Heart sounds: Normal heart sounds. No murmur heard.  No friction rub. No gallop.   Pulmonary:     Effort: Pulmonary effort is normal. No accessory muscle usage or respiratory distress.     Breath sounds: Normal breath sounds. No stridor. No wheezing.     Comments: Non prod cough. Upper resp congestion.  Chest:     Chest wall: Tenderness present.  Abdominal:     General: Bowel sounds are normal. There is no distension.     Palpations: Abdomen is soft.     Tenderness: There is no abdominal tenderness. There is no guarding.  Genitourinary:    Comments: No cva tenderness. Musculoskeletal:        General: No swelling or tenderness.      Cervical back: Normal range of motion and neck supple. No rigidity.     Comments: bil lower extremity amputee. No swelling/pain or stumps.   Skin:    General: Skin is warm and dry.     Findings: No rash.  Neurological:     Mental Status: He is alert.  Comments: Alert, speech clear.   Psychiatric:        Mood and Affect: Mood normal.     ED Results / Procedures / Treatments   Labs (all labs ordered are listed, but only abnormal results are displayed) Results for orders placed or performed during the hospital encounter of 02/27/20  Respiratory Panel by RT PCR (Flu A&B, Covid) - Nasopharyngeal Swab   Specimen: Nasopharyngeal Swab  Result Value Ref Range   SARS Coronavirus 2 by RT PCR NEGATIVE NEGATIVE   Influenza A by PCR NEGATIVE NEGATIVE   Influenza B by PCR NEGATIVE NEGATIVE  Basic metabolic panel  Result Value Ref Range   Sodium 136 135 - 145 mmol/L   Potassium 4.5 3.5 - 5.1 mmol/L   Chloride 94 (L) 98 - 111 mmol/L   CO2 25 22 - 32 mmol/L   Glucose, Bld 145 (H) 70 - 99 mg/dL   BUN 37 (H) 6 - 20 mg/dL   Creatinine, Ser 9.54 (H) 0.61 - 1.24 mg/dL   Calcium 9.4 8.9 - 10.3 mg/dL   GFR, Estimated 6 (L) >60 mL/min   Anion gap 17 (H) 5 - 15  CBC  Result Value Ref Range   WBC 12.0 (H) 4.0 - 10.5 K/uL   RBC 2.98 (L) 4.22 - 5.81 MIL/uL   Hemoglobin 10.2 (L) 13.0 - 17.0 g/dL   HCT 29.2 (L) 39 - 52 %   MCV 98.0 80.0 - 100.0 fL   MCH 34.2 (H) 26.0 - 34.0 pg   MCHC 34.9 30.0 - 36.0 g/dL   RDW 12.6 11.5 - 15.5 %   Platelets 160 150 - 400 K/uL   nRBC 0.0 0.0 - 0.2 %  Troponin I (High Sensitivity)  Result Value Ref Range   Troponin I (High Sensitivity) 7 <18 ng/L   DG Chest 2 View  Result Date: 02/27/2020 CLINICAL DATA:  Chest pain EXAM: CHEST - 2 VIEW COMPARISON:  09/27/2019 FINDINGS: The heart size and mediastinal contours are within normal limits. Both lungs are clear. Nonacute fracture deformity of the mid left clavicle. IMPRESSION: No acute abnormality of the lungs.  Electronically Signed   By: Eddie Candle M.D.   On: 02/27/2020 12:53    EKG EKG Interpretation  Date/Time:  Sunday February 27 2020 11:47:59 EDT Ventricular Rate:  91 PR Interval:    QRS Duration: 86 QT Interval:  356 QTC Calculation: 438 R Axis:   60 Text Interpretation: Sinus rhythm Partial missing lead(s): V2 V3 V4 V5 V6 12 Lead; [place] No significant change since last tracing Confirmed by Dorie Rank [phone]) on 02/27/2020 12:00:26 PM   Radiology DG Chest 2 View  Result Date: 02/27/2020 CLINICAL DATA:  Chest pain EXAM: CHEST - 2 VIEW COMPARISON:  09/27/2019 FINDINGS: The heart size and mediastinal contours are within normal limits. Both lungs are clear. Nonacute fracture deformity of the mid left clavicle. IMPRESSION: No acute abnormality of the lungs. Electronically Signed   By: Eddie Candle M.D.   On: 02/27/2020 12:53    Procedures Procedures (including critical care time)  Medications Ordered in ED Medications  acetaminophen (TYLENOL) tablet 1,000 mg (has no administration in time range)    ED Course  I have reviewed the triage vital signs and the nursing notes.  Pertinent labs & imaging results that were available during my care of the patient were reviewed by me and considered in my medical decision making (see chart for details).    MDM Rules/Calculators/A&P  Labs sent. Imaging ordered. Acetaminophen po. Wall-e device used for interpreter.   Reviewed nursing notes and prior charts for additional history.   CXR reviewed/interpreted by me - no pna.   Labs reviewed/interpreted by me - k normal. covid neg.   Will give abx rx.   Return precautions provided.      Final Clinical Impression(s) / ED Diagnoses Final diagnoses:  None    Rx / DC Orders ED Discharge Orders    None       Lajean Saver, MD 02/27/20 1623

## 2020-02-27 NOTE — ED Triage Notes (Signed)
Pt arrived via walk in, c/o right sided chest pain x2 weeks. States worsening with deep breathing and coughing. Denies any SOB or other sx. Denies any other sx. Denies any sick contacts.

## 2020-02-28 ENCOUNTER — Other Ambulatory Visit: Payer: Self-pay

## 2020-02-28 ENCOUNTER — Encounter (HOSPITAL_COMMUNITY): Payer: Self-pay

## 2020-02-28 ENCOUNTER — Inpatient Hospital Stay (HOSPITAL_COMMUNITY)
Admission: EM | Admit: 2020-02-28 | Discharge: 2020-03-02 | DRG: 152 | Disposition: A | Discharge status: Home / Self Care | Payer: Self-pay | Attending: Pulmonary Disease | Admitting: Pulmonary Disease

## 2020-02-28 ENCOUNTER — Emergency Department (HOSPITAL_COMMUNITY): Payer: Self-pay

## 2020-02-28 DIAGNOSIS — N186 End stage renal disease: Secondary | ICD-10-CM | POA: Diagnosis present

## 2020-02-28 DIAGNOSIS — Z9114 Patient's other noncompliance with medication regimen: Secondary | ICD-10-CM

## 2020-02-28 DIAGNOSIS — R1310 Dysphagia, unspecified: Secondary | ICD-10-CM | POA: Diagnosis not present

## 2020-02-28 DIAGNOSIS — E1122 Type 2 diabetes mellitus with diabetic chronic kidney disease: Secondary | ICD-10-CM | POA: Diagnosis present

## 2020-02-28 DIAGNOSIS — Z20822 Contact with and (suspected) exposure to covid-19: Secondary | ICD-10-CM | POA: Diagnosis present

## 2020-02-28 DIAGNOSIS — N2581 Secondary hyperparathyroidism of renal origin: Secondary | ICD-10-CM | POA: Diagnosis present

## 2020-02-28 DIAGNOSIS — R509 Fever, unspecified: Secondary | ICD-10-CM

## 2020-02-28 DIAGNOSIS — A419 Sepsis, unspecified organism: Secondary | ICD-10-CM | POA: Diagnosis present

## 2020-02-28 DIAGNOSIS — Z79899 Other long term (current) drug therapy: Secondary | ICD-10-CM

## 2020-02-28 DIAGNOSIS — K219 Gastro-esophageal reflux disease without esophagitis: Secondary | ICD-10-CM | POA: Diagnosis present

## 2020-02-28 DIAGNOSIS — Z794 Long term (current) use of insulin: Secondary | ICD-10-CM

## 2020-02-28 DIAGNOSIS — Z89511 Acquired absence of right leg below knee: Secondary | ICD-10-CM

## 2020-02-28 DIAGNOSIS — I120 Hypertensive chronic kidney disease with stage 5 chronic kidney disease or end stage renal disease: Secondary | ICD-10-CM | POA: Diagnosis present

## 2020-02-28 DIAGNOSIS — Z992 Dependence on renal dialysis: Secondary | ICD-10-CM

## 2020-02-28 DIAGNOSIS — M898X9 Other specified disorders of bone, unspecified site: Secondary | ICD-10-CM | POA: Diagnosis present

## 2020-02-28 DIAGNOSIS — I959 Hypotension, unspecified: Secondary | ICD-10-CM | POA: Diagnosis present

## 2020-02-28 DIAGNOSIS — D631 Anemia in chronic kidney disease: Secondary | ICD-10-CM | POA: Diagnosis present

## 2020-02-28 DIAGNOSIS — J069 Acute upper respiratory infection, unspecified: Principal | ICD-10-CM | POA: Diagnosis present

## 2020-02-28 DIAGNOSIS — E1140 Type 2 diabetes mellitus with diabetic neuropathy, unspecified: Secondary | ICD-10-CM | POA: Diagnosis present

## 2020-02-28 DIAGNOSIS — Z89512 Acquired absence of left leg below knee: Secondary | ICD-10-CM

## 2020-02-28 DIAGNOSIS — R079 Chest pain, unspecified: Secondary | ICD-10-CM

## 2020-02-28 DIAGNOSIS — B182 Chronic viral hepatitis C: Secondary | ICD-10-CM | POA: Diagnosis present

## 2020-02-28 DIAGNOSIS — B20 Human immunodeficiency virus [HIV] disease: Secondary | ICD-10-CM | POA: Diagnosis present

## 2020-02-28 DIAGNOSIS — Z833 Family history of diabetes mellitus: Secondary | ICD-10-CM

## 2020-02-28 LAB — COMPREHENSIVE METABOLIC PANEL
ALT: 9 U/L (ref 0–44)
AST: 15 U/L (ref 15–41)
Albumin: 2.9 g/dL — ABNORMAL LOW (ref 3.5–5.0)
Alkaline Phosphatase: 72 U/L (ref 38–126)
Anion gap: 20 — ABNORMAL HIGH (ref 5–15)
BUN: 14 mg/dL (ref 6–20)
CO2: 26 mmol/L (ref 22–32)
Calcium: 8.3 mg/dL — ABNORMAL LOW (ref 8.9–10.3)
Chloride: 91 mmol/L — ABNORMAL LOW (ref 98–111)
Creatinine, Ser: 5.52 mg/dL — ABNORMAL HIGH (ref 0.61–1.24)
GFR, Estimated: 11 mL/min — ABNORMAL LOW (ref >60)
Glucose, Bld: 167 mg/dL — ABNORMAL HIGH (ref 70–99)
Potassium: 3.7 mmol/L (ref 3.5–5.1)
Sodium: 137 mmol/L (ref 135–145)
Total Bilirubin: 1.5 mg/dL — ABNORMAL HIGH (ref 0.3–1.2)
Total Protein: 7.8 g/dL (ref 6.5–8.1)

## 2020-02-28 LAB — CBC WITH DIFFERENTIAL/PLATELET
Abs Immature Granulocytes: 0 10*3/uL (ref 0.00–0.07)
Basophils Absolute: 0.1 10*3/uL (ref 0.0–0.1)
Basophils Relative: 1 %
Eosinophils Absolute: 0 10*3/uL (ref 0.0–0.5)
Eosinophils Relative: 0 %
HCT: 28.8 % — ABNORMAL LOW (ref 39.0–52.0)
Hemoglobin: 9.5 g/dL — ABNORMAL LOW (ref 13.0–17.0)
Lymphocytes Relative: 4 %
Lymphs Abs: 0.5 10*3/uL — ABNORMAL LOW (ref 0.7–4.0)
MCH: 33.3 pg (ref 26.0–34.0)
MCHC: 33 g/dL (ref 30.0–36.0)
MCV: 101.1 fL — ABNORMAL HIGH (ref 80.0–100.0)
Monocytes Absolute: 0.3 10*3/uL (ref 0.1–1.0)
Monocytes Relative: 2 %
Neutro Abs: 11.7 10*3/uL — ABNORMAL HIGH (ref 1.7–7.7)
Neutrophils Relative %: 93 %
Platelets: 197 10*3/uL (ref 150–400)
RBC: 2.85 MIL/uL — ABNORMAL LOW (ref 4.22–5.81)
RDW: 13 % (ref 11.5–15.5)
WBC: 12.6 10*3/uL — ABNORMAL HIGH (ref 4.0–10.5)
nRBC: 0 % (ref 0.0–0.2)
nRBC: 0 /100 WBC

## 2020-02-28 LAB — RESPIRATORY PANEL BY RT PCR (FLU A&B, COVID)
Influenza A by PCR: NEGATIVE
Influenza B by PCR: NEGATIVE
SARS Coronavirus 2 by RT PCR: NEGATIVE

## 2020-02-28 LAB — LACTIC ACID, PLASMA
Lactic Acid, Venous: 1 mmol/L (ref 0.5–1.9)
Lactic Acid, Venous: 1 mmol/L (ref 0.5–1.9)

## 2020-02-28 LAB — PROTIME-INR
INR: 1.2 (ref 0.8–1.2)
Prothrombin Time: 14.6 seconds (ref 11.4–15.2)

## 2020-02-28 LAB — APTT: aPTT: 45 seconds — ABNORMAL HIGH (ref 24–36)

## 2020-02-28 MED ORDER — IOHEXOL 350 MG/ML SOLN
62.0000 mL | Freq: Once | INTRAVENOUS | Status: AC | PRN
  Administered 2020-02-28: 62 mL via INTRAVENOUS

## 2020-02-28 MED ORDER — SODIUM CHLORIDE 0.9 % IV BOLUS
500.0000 mL | Freq: Once | INTRAVENOUS | Status: AC
  Administered 2020-02-28: 500 mL via INTRAVENOUS

## 2020-02-28 MED ORDER — VANCOMYCIN VARIABLE DOSE PER UNSTABLE RENAL FUNCTION (PHARMACIST DOSING): Status: DC

## 2020-02-28 MED ORDER — METRONIDAZOLE IN NACL 5-0.79 MG/ML-% IV SOLN
500.0000 mg | Freq: Once | INTRAVENOUS | Status: AC
  Administered 2020-02-28: 500 mg via INTRAVENOUS
  Filled 2020-02-28: qty 100

## 2020-02-28 MED ORDER — MIDODRINE HCL 5 MG PO TABS
10.0000 mg | ORAL_TABLET | Freq: Three times a day (TID) | ORAL | Status: DC
  Administered 2020-02-28: 10 mg via ORAL
  Filled 2020-02-28: qty 2

## 2020-02-28 MED ORDER — ALBUTEROL SULFATE (2.5 MG/3ML) 0.083% IN NEBU: 2.5000 mg | INHALATION_SOLUTION | RESPIRATORY_TRACT | Status: DC | PRN

## 2020-02-28 MED ORDER — VANCOMYCIN HCL IN DEXTROSE 1-5 GM/200ML-% IV SOLN: 1000.0000 mg | Freq: Once | INTRAVENOUS | Status: DC

## 2020-02-28 MED ORDER — DOCUSATE SODIUM 100 MG PO CAPS: 100.0000 mg | ORAL_CAPSULE | Freq: Two times a day (BID) | ORAL | Status: DC | PRN

## 2020-02-28 MED ORDER — INSULIN ASPART 100 UNIT/ML ~~LOC~~ SOLN
1.0000 [IU] | Freq: Three times a day (TID) | SUBCUTANEOUS | Status: DC
  Administered 2020-02-29: 1 [IU] via SUBCUTANEOUS
  Administered 2020-02-29 (×3): 2 [IU] via SUBCUTANEOUS
  Administered 2020-03-01 – 2020-03-02 (×3): 1 [IU] via SUBCUTANEOUS

## 2020-02-28 MED ORDER — MIDODRINE HCL 5 MG PO TABS
10.0000 mg | ORAL_TABLET | Freq: Three times a day (TID) | ORAL | Status: DC
  Administered 2020-02-29 – 2020-03-02 (×7): 10 mg via ORAL
  Filled 2020-02-28 (×6): qty 2

## 2020-02-28 MED ORDER — HEPARIN SODIUM (PORCINE) 5000 UNIT/ML IJ SOLN
5000.0000 [IU] | Freq: Three times a day (TID) | INTRAMUSCULAR | Status: DC
  Administered 2020-02-28 – 2020-03-02 (×8): 5000 [IU] via SUBCUTANEOUS
  Filled 2020-02-28 (×6): qty 1

## 2020-02-28 MED ORDER — BICTEGRAVIR-EMTRICITAB-TENOFOV 50-200-25 MG PO TABS
1.0000 | ORAL_TABLET | Freq: Every day | ORAL | Status: DC
  Administered 2020-02-29 – 2020-03-02 (×3): 1 via ORAL
  Filled 2020-02-28 (×3): qty 1

## 2020-02-28 MED ORDER — SODIUM CHLORIDE 0.9 % IV SOLN
2.0000 g | Freq: Once | INTRAVENOUS | Status: AC
  Administered 2020-02-28: 2 g via INTRAVENOUS
  Filled 2020-02-28: qty 2

## 2020-02-28 MED ORDER — POLYETHYLENE GLYCOL 3350 17 G PO PACK: 17.0000 g | PACK | Freq: Every day | ORAL | Status: DC | PRN

## 2020-02-28 MED ORDER — SODIUM CHLORIDE 0.9 % IV BOLUS
1000.0000 mL | Freq: Once | INTRAVENOUS | Status: AC
  Administered 2020-02-28: 1000 mL via INTRAVENOUS

## 2020-02-28 MED ORDER — VANCOMYCIN HCL 1250 MG/250ML IV SOLN
1250.0000 mg | Freq: Once | INTRAVENOUS | Status: AC
  Administered 2020-02-28: 1250 mg via INTRAVENOUS
  Filled 2020-02-28: qty 250

## 2020-02-28 MED ORDER — ACETAMINOPHEN 500 MG PO TABS
1000.0000 mg | ORAL_TABLET | Freq: Once | ORAL | Status: AC
  Administered 2020-02-28: 1000 mg via ORAL
  Filled 2020-02-28: qty 2

## 2020-02-28 MED ORDER — ONDANSETRON HCL 4 MG/2ML IJ SOLN: 4.0000 mg | Freq: Four times a day (QID) | INTRAMUSCULAR | Status: DC | PRN

## 2020-02-28 MED ORDER — SODIUM CHLORIDE 0.9 % IV SOLN
1.0000 g | INTRAVENOUS | Status: DC
  Administered 2020-02-29: 1 g via INTRAVENOUS
  Filled 2020-02-28 (×3): qty 1

## 2020-02-28 NOTE — H&P (Signed)
NAMELaurie Lovejoy, MRN:  [id number], DOB:  13-Feb-1971, LOS: 0 ADMISSION DATE:  02/28/2020, CONSULTATION DATE:  02/28/2020 REFERRING MD:  Gareth Morgan MD CHIEF COMPLAINT:  Sore throat  Brief History   49 yo male with PMH significant for HIV, DM2, ESRD with HD MWF, BLE amputation admitted with hypotension.  History of present illness   History obtained using interpreter.  Patient hospitalized in July for MRSA bacteremia, ECHO at that time without vegetation, and discharged on 7/24 with 6 weeks of IV Vancomycin.    Patient presented to ED on 10/17 with complaints of sore throat, cough, and chest pain x 4 days.  He received Amoxicillin and discharged home.  Today, he went to HD where he developed chills and generalized feeling unwell. EMS was called and patient was noted to be tachycardic and febrile at 102. He was transported to [facility] ED.  He received 500 ml NS bolus by EMS.  Patient was noted to be persistently hypotensive in the ED and [facility] consulted to evaluate for admission. On arrival to evaluate patient in the ED, he is alert and oriented, no focal deficits on exam, his only complaints are sore throat and cough.  He denies any nausea/vomiting/abdominal pain. No evidence of cellulitis to bilateral stumps, no recent rash.  Patient was unable to state his dry weight or how much fluid removed at HD today.  Labs notable for mildly elevated wbc: 12, hgb 9.5, plt 197, BG 167.  Lactate 1.    Past Medical History  HIV DM, Type II ESRD on HD MWF --LUE access BLE amputations  Significant Hospital Events   10/18: Admit to [facility]  Consults:  10/18: PCCM  Procedures:    Significant Diagnostic Tests:  CTA Chest 10/18: No evidence of pulmonary embolism, small amount of atelectasis to lower lobes.  EKG 10/18: No ST elevation  Micro Data:  Group A Strep 02/27/2020: Not detected COVID/Influenza 02/27/2020: Negative Blood Cx: 10/18: Pending  Antimicrobials:  Vanc 10/18  >> Cefepime 10/18 >>  Interim history/subjective:  New admit  Objective   Blood pressure (!) 67/38, pulse 86, temperature (!) 100.6 F (38.1 C), temperature source Oral, resp. rate (!) 22, height 5' (1.524 m), weight 59 kg, SpO2 96 %.        Intake/Output Summary (Last 24 hours) at 02/28/2020 2234 Last data filed at 02/28/2020 2219 Gross per 24 hour  Intake 2950 ml  Output --  Net 2950 ml   Filed Weights   02/28/20 1602  Weight: 59 kg    Examination: General: Spanish speaking adult male resting in bed without distress HENT: Normocephalic/Atraumatic Lungs: Symmetric expansion, clear lung fields Cardiovascular: Warm and well perfused in upper extremities, Palpable radial pulses, BLE amputations Abdomen: non-distended, non tender Extremities: BLE amputation. LUE access site Neuro: Alert and oriented, no focal deficits.   Resolved Hospital Problem list     Assessment & Plan:  Hypotension -- Etiology: Suspect multifactorial with missed midodrine doses today, HD and possible infection -- Resume home Midodrine: 10 mg TID -- Blood Cx pending, Strep pending, continue empiric abx: Vanc/Cefepime -- + 2.9 L I/O today. Hold further IVF resuscitation  -- Consider repeat ECHO -- Goal MAP > 55. He is showing no evidence of mal perfusion on exam and lactate normal.   DM, type II -- SSI   HIV --Continue home Biktarvy  ESRD -- Notify nephrology of his admission in am, no indication for emergent HD tonight Best practice:  Diet: As tolerated Pain/Anxiety/Delirium  protocol (if indicated): n/a VAP protocol (if indicated): n/a DVT prophylaxis: heparin subq GI prophylaxis: n/a Glucose control: SSI Mobility: Ambulate with assistance Code Status: Discussed with patient, FULL CODE.  Disposition: Admit to ICU with expected length of stay > 2 nights.   Labs   CBC: Recent Labs  Lab 02/27/20 1203 02/28/20 1722  WBC 12.0* 12.6*  NEUTROABS  --  11.7*  HGB 10.2* 9.5*  HCT 29.2*  28.8*  MCV 98.0 101.1*  PLT 160 681    Basic Metabolic Panel: Recent Labs  Lab 02/27/20 1203 02/28/20 1722  NA 136 137  K 4.5 3.7  CL 94* 91*  CO2 25 26  GLUCOSE 145* 167*  BUN 37* 14  CREATININE 9.54* 5.52*  CALCIUM 9.4 8.3*   GFR: Estimated Creatinine Clearance: 11.4 mL/min (A) (by C-G formula based on SCr of 5.52 mg/dL (H)). Recent Labs  Lab 02/27/20 1203 02/28/20 1722 02/28/20 2127  WBC 12.0* 12.6*  --   LATICACIDVEN  --  1.0 1.0    Liver Function Tests: Recent Labs  Lab 02/28/20 1722  AST 15  ALT 9  ALKPHOS 72  BILITOT 1.5*  PROT 7.8  ALBUMIN 2.9*   No results for input(s): LIPASE, AMYLASE in the last 168 hours. No results for input(s): AMMONIA in the last 168 hours.  ABG    Component Value Date/Time   PHART 7.460 (H) 03/03/2016 1439   PCO2ART 36.9 03/03/2016 1439   PO2ART 34.0 (LL) 03/03/2016 1439   HCO3 26.2 03/03/2016 1439   TCO2 30 05/28/2016 1414   TCO2 30 05/28/2016 1414   O2SAT 69.0 03/03/2016 1439     Coagulation Profile: Recent Labs  Lab 02/28/20 1722  INR 1.2    Cardiac Enzymes: No results for input(s): CKTOTAL, CKMB, CKMBINDEX, TROPONINI in the last 168 hours.  HbA1C: HbA1c, POC (controlled diabetic range)  Date/Time Value Ref Range Status  09/28/2018 02:21 PM 6.8 0.0 - 7.0 % Final   Hgb A1c MFr Bld  Date/Time Value Ref Range Status  12/02/2019 03:43 AM 6.9 (H) 4.8 - 5.6 % Final    Comment:    (NOTE) Pre diabetes:          5.7%-6.4%  Diabetes:              >6.4%  Glycemic control for   <7.0% adults with diabetes   06/18/2018 06:47 AM 9.0 (H) 4.8 - 5.6 % Final    Comment:    (NOTE) Pre diabetes:          5.7%-6.4% Diabetes:              >6.4% Glycemic control for   <7.0% adults with diabetes     CBG: No results for input(s): GLUCAP in the last 168 hours.  Review of Systems:   Review of Systems  Constitutional: Positive for chills and fever.  HENT: Positive for sore throat.   Respiratory: Positive for  cough.   Gastrointestinal: Negative.   Musculoskeletal: Negative.   Skin: Negative.   Neurological: Negative.   Endo/Heme/Allergies: Negative.     Past Medical History  He,  has a past medical history of AIDS ([place]) (11/22/2014), Back pain (06/09/2019), BKA stump complication ([place]) (2/75/1700), Chronic diarrhea, Chronic hepatitis C without hepatic coma ([place]) (11/22/2014), 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[place]), Diabetic neuropathy ([place]), ESRD (end stage renal disease) on dialysis ([place]), Hepatitis C, HIV INFECTION (06/27/2010), Hypotension (1/74/9449), Metabolic bone disease (10/18/5914), MRSA infection, Normocytic anemia (06/17/2012), Pancreatitis, Pressure ulcer of BKA stump, stage 2 (  [place]) (11/22/2014), Renal disorder, Severe protein-calorie malnutrition ([place]) (06/17/2012), and Uncontrolled diabetes mellitus with complications ([place]) (9/92/4268).   Surgical History    Past Surgical History:  Procedure Laterality Date  . AMPUTATION Left 04/20/2014   Procedure: 3rd toe amputation, 4th Toe Amputation,  5th Toe Amputation;  Surgeon: Newt Minion, MD;  Location: [place];  Service: Orthopedics;  Laterality: Left;  . AMPUTATION Left 05/02/2014   Procedure: Midfoot Amputation;  Surgeon: Newt Minion, MD;  Location: [place];  Service: Orthopedics;  Laterality: Left;  . AMPUTATION Left 06/17/2014   Procedure: AMPUTATION BELOW KNEE;  Surgeon: Newt Minion, MD;  Location: [place];  Service: Orthopedics;  Laterality: Left;  . AMPUTATION Right 09/04/2018   Procedure: RIGHT BELOW KNEE AMPUTATION;  Surgeon: Newt Minion, MD;  Location: [place];  Service: Orthopedics;  Laterality: Right;  RIGHT BELOW KNEE AMPUTATION  . AV FISTULA PLACEMENT Left   . AV FISTULA PLACEMENT Left 05/10/2016   Procedure: Creation Left Arm Brachiocephalic Arteriovenous Fistula and Ligation of Radiocephalic Fistula;  Surgeon: Angelia Mould, MD;  Location: [place];  Service: Vascular;  Laterality: Left;  . FEMUR IM NAIL Left 08/17/2016   Procedure:  INTRAMEDULLARY (IM) RETROGRADE FEMORAL NAILING;  Surgeon: Marybelle Killings, MD;  Location: [place];  Service: Orthopedics;  Laterality: Left;  . FOOT AMPUTATION THROUGH ANKLE Left 12/'21/2015   midfoot  . IR GENERIC HISTORICAL Left 05/01/2016   IR THROMBECTOMY AV FISTULA W/THROMBOLYSIS/PTA INC/SHUNT/IMG LEFT 05/01/2016 [name], MD [facility]  . IR GENERIC HISTORICAL  05/01/2016   IR US GUIDE VASC ACCESS LEFT 05/01/2016 [name], MD [facility]  . IR GENERIC HISTORICAL  05/07/2016   IR FLUORO GUIDE CV LINE RIGHT 05/07/2016 Corrie Mckusick, DO [facility]  . IR GENERIC HISTORICAL  05/07/2016   IR US GUIDE VASC ACCESS RIGHT 05/07/2016 Corrie Mckusick, DO [facility]  . IR GENERIC HISTORICAL  05/22/2016   IR US GUIDE VASC ACCESS RIGHT 05/22/2016 Greggory Keen, MD [facility]  . IR GENERIC HISTORICAL  05/22/2016   IR FLUORO GUIDE CV LINE RIGHT 05/22/2016 Greggory Keen, MD [facility]  . IR REMOVAL TUN CV CATH W/O FL  08/21/2016  . PERIPHERAL VASCULAR CATHETERIZATION Left 05/09/2016   Procedure: A/V Fistulagram;  Surgeon: Angelia Mould, MD;  Location: [place] [facility];  Service: Cardiovascular;  Laterality: Left;  arm  . TEE WITHOUT CARDIOVERSION N/A 06/22/2018   Procedure: TRANSESOPHAGEAL ECHOCARDIOGRAM (TEE);  Surgeon: Jerline Pain, MD;  Location: [facility] ENDOSCOPY;  Service: Cardiovascular;  Laterality: N/A;     Social History   reports that he has never smoked. He has never used smokeless tobacco. He reports that he does not drink alcohol and does not use drugs.   Family History   His family history includes Diabetes in his father and mother.   Allergies No Known Allergies   Home Medications  Prior to Admission medications   Medication Sig Start Date End Date Taking? Authorizing Provider  amoxicillin (AMOXIL) 500 MG capsule Take 1 capsule (500 mg total) by mouth daily. 02/28/20   Lajean Saver, MD  bictegravir-emtricitabine-tenofovir AF (BIKTARVY) 50-200-25 MG TABS  tablet Take 1 tablet by mouth daily. 06/09/19   Truman Hayward, MD  Blood Glucose Monitoring Suppl (TRUE METRIX METER) DEVI 1 each by Does not apply route 3 (three) times daily. 09/15/18   [name], MD  glucose blood (TRUE METRIX BLOOD GLUCOSE TEST) test strip 1 each by Other route 3 (three) times  daily. 09/15/18   [name], MD  insulin NPH-regular Human (HUMULIN 70/30) (70-30) 100 UNIT/ML injection Inject 15 Units into the skin 2 (two) times daily before a meal. 02/10/20   [name], MD  Insulin Syringe-Needle U-100 (BD INSULIN SYRINGE ULTRAFINE) 31G X 15/64" 0.5 ML MISC 1 each by Does not apply route 2 (two) times daily. 02/10/20   [name], MD  midodrine (PROAMATINE) 10 MG tablet Take 1 tablet (10 mg total) by mouth 3 (three) times daily with meals. 12/10/19   Regalado, Belkys A, MD  TRUEplus Lancets 28G MISC 1 each by Does not apply route 3 (three) times daily. 09/15/18   [name], MD  vancomycin (VANCOREADY) 500 MG/100ML IVPB Inject 100 mLs (500 mg total) into the vein every Monday, Wednesday, and Friday with hemodialysis. 12/10/19 06/07/20  Regalado, Cassie Freer, MD     Critical care time: 48

## 2020-02-28 NOTE — ED Provider Notes (Addendum)
[place] [facility] EMERGENCY DEPARTMENT Provider Note   CSN: [id number] Arrival date & time: 02/28/20  1504     History Chief Complaint  Patient presents with  . Chest Pain  . Fever    Alvin Daniels is a 49 y.o. male.  HPI      49 year old male with history of diabetes type 2, HIV on HAART, ESRD on dialysis Monday Wednesday Friday, bilateral BKA's, admission in July with concern for MRSA bacteremia right stump cellulitis who presents with concern for cough, chills and fever.  While he was hospitalized he was unable to have a TEE due to low blood pressure and risk, but decision was made to proceed with 6 weeks of treatment with IV vancomycin with dialysis.  4 days ago, he developed right-sided chest pain worse with deep breaths, cough.  He was evaluated yesterday in the [facility] ED and had a negative chest x-ray, negative Covid testing, and was given p.o. antibiotics.  He completed dialysis today, but describes severe chills and continued feeling of being unwell, and EMS was called and brought him to the emergency department.  His blood pressures were in the 63Z systolic on arrival with tachycardia and a fever of 102.  BP improved with fluids.  Pt reports his baseline blood pressures are pretty low. (63s)  Spanish interpreter used  Past Medical History:  Diagnosis Date  . AIDS ([place]) 11/22/2014  . Back pain 06/09/2019  . BKA stump complication ([place]) 8/58/8502  . Chronic diarrhea   . Chronic hepatitis C without hepatic coma ([place]) 11/22/2014  . 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 Diabetic neuropathy ([place])   . ESRD (end stage renal disease) on dialysis [facility])    "TTS; don't remember street name" (05/03/2014)  . Hepatitis C   . HIV INFECTION 06/27/2010   Qualifier: Diagnosis of  By: Nickola Major CMA ( [place]), Geni Bers    . Hypotension 06/02/2012  . Metabolic bone disease 11/17/4126  . MRSA infection   . Normocytic anemia 06/17/2012  . Pancreatitis   .  Pressure ulcer of BKA stump, stage 2 ([place]) 11/22/2014  . Renal disorder   . Severe protein-calorie malnutrition ([place]) 06/17/2012  . Uncontrolled diabetes mellitus with complications ([place]) 7/86/7672   Annotation: uncontrolled Qualifier: Diagnosis of  By: Nickola Major CMA Deborra Medina), Jacqueline      Patient Active Problem List   Diagnosis Date Noted  . Sepsis ([place]) 02/28/2020  . Amputation stump infection ([place]) 12/02/2019  . Scapular fracture 09/28/2019  . COVID-19 virus infection 09/28/2019  . Chest pain 09/27/2019  . Hx MRSA infection 09/21/2019  . BKA stump complication ([place]) 09/47/0962  . Back pain 06/09/2019  . Abscess of foot   . Abscess of ankle 08/29/2018  . Charcot foot due to diabetes mellitus ([place]) 07/23/2018  . MRSA bacteremia   . Bacteremia 06/18/2018  . Diabetic foot ulcer ([place]) 11/19/2017  . Femur fracture, left ([place]) 08/16/2016  . HIV (human immunodeficiency virus infection) ([place]) 08/16/2016  . GERD (gastroesophageal reflux disease) 05/24/2016  . Burn 04/10/2016  . Pressure ulcer of BKA stump, stage 2 ([place]) 11/22/2014  . Chronic hepatitis C without hepatic coma ([place]) 11/22/2014  . S/P bilateral BKA (below knee amputation) ([place]) 06/17/2014  . HIV disease ([place])   . Poor dentition 10/25/2013  . ESRD on hemodialysis ([place]) 10/25/2013  . Vision changes 06/21/2013  . Orthostatic headache 04/15/2013  . Anemia of chronic renal failure 06/19/2012  . Metabolic bone disease 83/66/2947  . Chronic  diarrhea 06/17/2012  . Severe protein-calorie malnutrition ([place]) 06/17/2012  . Normocytic anemia 06/17/2012  . Hypotension 06/02/2012  . Cellulitis of right lower extremity 10/19/2010  . Polyneuropathy in diabetes(357.2) 06/27/2010  . Diabetes mellitus with ESRD (end-stage renal disease) ([place]) 01/03/2010    Past Surgical History:  Procedure Laterality Date  . AMPUTATION Left 04/20/2014   Procedure: 3rd toe amputation, 4th Toe Amputation,  5th Toe Amputation;  Surgeon: Newt Minion,  MD;  Location: [place];  Service: Orthopedics;  Laterality: Left;  . AMPUTATION Left 05/02/2014   Procedure: Midfoot Amputation;  Surgeon: Newt Minion, MD;  Location: [place] [facility];  Service: Orthopedics;  Laterality: Left;  . AMPUTATION Left 06/17/2014   Procedure: AMPUTATION BELOW KNEE;  Surgeon: Newt Minion, MD;  Location: [place];  Service: Orthopedics;  Laterality: Left;  . AMPUTATION Right 09/04/2018   Procedure: RIGHT BELOW KNEE AMPUTATION;  Surgeon: Newt Minion, MD;  Location: [place];  Service: Orthopedics;  Laterality: Right;  RIGHT BELOW KNEE AMPUTATION  . AV FISTULA PLACEMENT Left   . AV FISTULA PLACEMENT Left 05/10/2016   Procedure: Creation Left Arm Brachiocephalic Arteriovenous Fistula and Ligation of Radiocephalic Fistula;  Surgeon: Angelia Mould, MD;  Location: [place];  Service: Vascular;  Laterality: Left;  . FEMUR IM NAIL Left 08/17/2016   Procedure: INTRAMEDULLARY (IM) RETROGRADE FEMORAL NAILING;  Surgeon: Marybelle Killings, MD;  Location: [place];  Service: Orthopedics;  Laterality: Left;  . FOOT AMPUTATION THROUGH ANKLE Left 12/'21/2015   midfoot  . IR GENERIC HISTORICAL Left 05/01/2016   IR THROMBECTOMY AV FISTULA W/THROMBOLYSIS/PTA INC/SHUNT/IMG LEFT 05/01/2016 [name], MD [facility]  . IR GENERIC HISTORICAL  05/01/2016   IR US GUIDE VASC ACCESS LEFT 05/01/2016 [name], MD [facility]  . IR GENERIC HISTORICAL  05/07/2016   IR FLUORO GUIDE CV LINE RIGHT 05/07/2016 Corrie Mckusick, DO [facility]  . IR GENERIC HISTORICAL  05/07/2016   IR US GUIDE VASC ACCESS RIGHT 05/07/2016 Corrie Mckusick, DO [facility]  . IR GENERIC HISTORICAL  05/22/2016   IR US GUIDE VASC ACCESS RIGHT 05/22/2016 Greggory Keen, MD [facility]  . IR GENERIC HISTORICAL  05/22/2016   IR FLUORO GUIDE CV LINE RIGHT 05/22/2016 Greggory Keen, MD [facility]  . IR REMOVAL TUN CV CATH W/O FL  08/21/2016  . PERIPHERAL VASCULAR CATHETERIZATION Left 05/09/2016   Procedure: A/V Fistulagram;  Surgeon:  Angelia Mould, MD;  Location: [place] CV LAB;  Service: Cardiovascular;  Laterality: Left;  arm  . TEE WITHOUT CARDIOVERSION N/A 06/22/2018   Procedure: TRANSESOPHAGEAL ECHOCARDIOGRAM (TEE);  Surgeon: Jerline Pain, MD;  Location: [facility] ENDOSCOPY;  Service: Cardiovascular;  Laterality: N/A;       Family History  Problem Relation Age of Onset  . Diabetes Mother   . Diabetes Father     Social History   Tobacco Use  . Smoking status: Never Smoker  . Smokeless tobacco: Never Used  Vaping Use  . Vaping Use: Never used  Substance Use Topics  . Alcohol use: No  . Drug use: No    Home Medications Prior to Admission medications   Medication Sig Start Date End Date Taking? Authorizing Provider  bictegravir-emtricitabine-tenofovir AF (BIKTARVY) 50-200-25 MG TABS tablet Take 1 tablet by mouth daily. 06/09/19  Yes Tommy Medal, Lavell Islam, MD  Blood Glucose Monitoring Suppl (TRUE METRIX METER) DEVI 1 each by Does not apply route 3 (three) times daily. 09/15/18  Yes [name], MD  glucose blood (TRUE  METRIX BLOOD GLUCOSE TEST) test strip 1 each by Other route 3 (three) times daily. 09/15/18  Yes [name], MD  insulin NPH-regular Human (HUMULIN 70/30) (70-30) 100 UNIT/ML injection Inject 15 Units into the skin 2 (two) times daily before a meal. Patient taking differently: Inject 9 Units into the skin 2 (two) times daily before a meal.  02/10/20  Yes Newlin, Enobong, MD  Insulin Syringe-Needle U-100 (BD INSULIN SYRINGE ULTRAFINE) 31G X 15/64" 0.5 ML MISC 1 each by Does not apply route 2 (two) times daily. 02/10/20  Yes [name], MD  midodrine (PROAMATINE) 10 MG tablet Take 1 tablet (10 mg total) by mouth 3 (three) times daily with meals. 12/10/19  Yes Regalado, Belkys A, MD  TRUEplus Lancets 28G MISC 1 each by Does not apply route 3 (three) times daily. 09/15/18  Yes [name], MD    Allergies    Patient has no known allergies.  Review of Systems   Review of Systems    Constitutional: Positive for appetite change, chills, fatigue and fever.  HENT: Positive for sore throat.   Eyes: Negative for visual disturbance.  Respiratory: Positive for cough. Negative for shortness of breath.   Cardiovascular: Positive for chest pain.  Gastrointestinal: Negative for abdominal pain, nausea and vomiting.  Genitourinary: Negative for difficulty urinating (does not make urine).  Musculoskeletal: Negative for back pain and neck stiffness.  Skin: Negative for rash.  Neurological: Negative for syncope and headaches.    Physical Exam Updated Vital Signs BP (!) 88/57   Pulse 91   Temp (!) 100.6 F (38.1 C) (Oral)   Resp (!) 22   Ht 5' (1.524 m)   Wt 59 kg   SpO2 99%   BMI 25.39 kg/m   Physical Exam Vitals and nursing note reviewed.  Constitutional:      General: He is not in acute distress.    Appearance: He is well-developed. He is not diaphoretic.  HENT:     Head: Normocephalic and atraumatic.     Comments: Midline uvula, no erythema or exudate to oropharynx Eyes:     Conjunctiva/sclera: Conjunctivae normal.  Cardiovascular:     Rate and Rhythm: Regular rhythm. Tachycardia present.     Heart sounds: Normal heart sounds. No murmur heard.  No friction rub. No gallop.   Pulmonary:     Effort: Pulmonary effort is normal. No respiratory distress.     Breath sounds: Normal breath sounds. No wheezing or rales.  Abdominal:     General: There is no distension.     Palpations: Abdomen is soft.     Tenderness: There is no abdominal tenderness. There is no guarding.  Musculoskeletal:     Cervical back: Normal range of motion.     Comments: Bilateral BKA, no erythema  Skin:    General: Skin is warm and dry.  Neurological:     Mental Status: He is alert and oriented to person, place, and time.     ED Results / Procedures / Treatments   Labs (all labs ordered are listed, but only abnormal results are displayed) Labs Reviewed  COMPREHENSIVE METABOLIC  PANEL - Abnormal; Notable for the following components:      Result Value   Chloride 91 (*)    Glucose, Bld 167 (*)    Creatinine, Ser 5.52 (*)    Calcium 8.3 (*)    Albumin 2.9 (*)    Total Bilirubin 1.5 (*)    GFR, Estimated 11 (*)    Anion  gap 20 (*)    All other components within normal limits  CBC WITH DIFFERENTIAL/PLATELET - Abnormal; Notable for the following components:   WBC 12.6 (*)    RBC 2.85 (*)    Hemoglobin 9.5 (*)    HCT 28.8 (*)    MCV 101.1 (*)    Neutro Abs 11.7 (*)    Lymphs Abs 0.5 (*)    All other components within normal limits  APTT - Abnormal; Notable for the following components:   aPTT 45 (*)    All other components within normal limits  RESPIRATORY PANEL BY RT PCR (FLU A&B, COVID)  CULTURE, BLOOD (ROUTINE X 2)  CULTURE, BLOOD (ROUTINE X 2)  CULTURE, RESPIRATORY  GROUP A STREP BY PCR  LACTIC ACID, PLASMA  LACTIC ACID, PLASMA  PROTIME-INR  CBC  CREATININE, SERUM  PROCALCITONIN  CORTISOL  URINALYSIS, ROUTINE W REFLEX MICROSCOPIC  CBC  BASIC METABOLIC PANEL    EKG EKG Interpretation  Date/Time:  Monday February 28 2020 15:58:20 EDT Ventricular Rate:  110 PR Interval:    QRS Duration: 130 QT Interval:  340 QTC Calculation: 460 R Axis:   96 Text Interpretation: Sinus tachycardia Artifact in V5-no significant change in comparison to prior ECG Confirmed by Gareth Morgan [phone]) on 02/28/2020 7:02:58 PM   Radiology DG Chest 2 View  Result Date: 02/27/2020 CLINICAL DATA:  Chest pain EXAM: CHEST - 2 VIEW COMPARISON:  09/27/2019 FINDINGS: The heart size and mediastinal contours are within normal limits. Both lungs are clear. Nonacute fracture deformity of the mid left clavicle. IMPRESSION: No acute abnormality of the lungs. Electronically Signed   By: Eddie Candle M.D.   On: 02/27/2020 12:53   CT Angio Chest PE W and/or Wo Contrast  Result Date: 02/28/2020 CLINICAL DATA:  Bacteremia, possible septic emboli EXAM: CT ANGIOGRAPHY CHEST WITH  CONTRAST TECHNIQUE: Multidetector CT imaging of the chest was performed using the standard protocol during bolus administration of intravenous contrast. Multiplanar CT image reconstructions and MIPs were obtained to evaluate the vascular anatomy. CONTRAST:  80mL OMNIPAQUE IOHEXOL 350 MG/ML SOLN COMPARISON:  Chest x-ray from the previous day, 09/27/2019. FINDINGS: Cardiovascular: Thoracic aorta and its branches are well visualized and within normal limits. No aneurysmal dilatation or dissection is noted. Mild atherosclerotic calcifications are seen. No cardiac enlargement is seen. Coronary calcifications are noted. The pulmonary artery shows a normal branching pattern. No focal filling defect to suggest pulmonary embolism is noted. Mediastinum/Nodes: Thoracic inlet is within normal limits. No sizable hilar or mediastinal adenopathy is noted. Calcified lymph nodes are seen consistent with prior granulomatous disease. The esophagus as visualized is within normal limits. Lungs/Pleura: The lungs are well aerated bilaterally. Some changes of mucous plugging in the lower lobe bronchial tree is noted on the right. Some associated atelectatic changes are noted. Similar changes are noted in the left lower lobe. No findings to suggest septic emboli are seen. No pneumothorax or sizable effusion is noted. Upper Abdomen: Visualized upper abdomen is unremarkable. Stable calcifications along the corticomedullary junction in the kidneys are seen similar to prior CT examination. Musculoskeletal: Mild degenerative change of the thoracic spine is noted. No acute bony abnormality is noted. Review of the MIP images confirms the above findings. IMPRESSION: No evidence of pulmonary emboli. No findings to suggest septic emboli Changes of prior granulomatous disease. Mucous plugging in the lower lobes bilaterally with associated atelectatic changes. 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 Aortic  Atherosclerosis (ICD10-I70.0). Electronically Signed   By: Elta Guadeloupe  Lukens M.D.   On: 02/28/2020 17:50    Procedures .Critical Care Performed by: Gareth Morgan, MD Authorized by: Gareth Morgan, MD   Critical care provider statement:    Critical care time (minutes):  45   Critical care was time spent personally by me on the following activities:  Evaluation of patient's response to treatment, examination of patient, ordering and performing treatments and interventions, ordering and review of laboratory studies, ordering and review of radiographic studies, pulse oximetry, re-evaluation of patient's condition, obtaining history from patient or surrogate and review of old charts   (including critical care time)  Medications Ordered in ED Medications  ceFEPIme (MAXIPIME) 1 g in sodium chloride 0.9 % 100 mL IVPB (has no administration in time range)  midodrine (PROAMATINE) tablet 10 mg (10 mg Oral Given 02/28/20 2001)  docusate sodium (COLACE) capsule 100 mg (has no administration in time range)  polyethylene glycol (MIRALAX / GLYCOLAX) packet 17 g (has no administration in time range)  heparin injection 5,000 Units (5,000 Units Subcutaneous Given 02/28/20 2324)  ondansetron (ZOFRAN) injection 4 mg (has no administration in time range)  albuterol (PROVENTIL) (2.5 MG/3ML) 0.083% nebulizer solution 2.5 mg (has no administration in time range)  insulin aspart (novoLOG) injection 1-3 Units (has no administration in time range)  bictegravir-emtricitabine-tenofovir AF (BIKTARVY) 50-200-25 MG per tablet 1 tablet (has no administration in time range)  midodrine (PROAMATINE) tablet 10 mg (has no administration in time range)  vancomycin variable dose per unstable renal function (pharmacist dosing) (has no administration in time range)  ceFEPIme (MAXIPIME) 2 g in sodium chloride 0.9 % 100 mL IVPB (0 g Intravenous Stopped 02/28/20 1759)  metroNIDAZOLE (FLAGYL) IVPB 500 mg (0 mg Intravenous Stopped  02/28/20 1833)  acetaminophen (TYLENOL) tablet 1,000 mg (1,000 mg Oral Given 02/28/20 1639)  vancomycin (VANCOREADY) IVPB 1250 mg/250 mL (0 mg Intravenous Stopped 02/28/20 1948)  iohexol (OMNIPAQUE) 350 MG/ML injection 62 mL (62 mLs Intravenous Contrast Given 02/28/20 1734)  sodium chloride 0.9 % bolus 1,000 mL (0 mLs Intravenous Stopped 02/28/20 1955)  sodium chloride 0.9 % bolus 1,000 mL (0 mLs Intravenous Stopped 02/28/20 2031)  sodium chloride 0.9 % bolus 1,000 mL (0 mLs Intravenous Stopped 02/28/20 2219)  sodium chloride 0.9 % bolus 500 mL (500 mLs Intravenous New Bag/Given 02/28/20 2333)    ED Course  I have reviewed the triage vital signs and the nursing notes.  Pertinent labs & imaging results that were available during my care of the patient were reviewed by me and considered in my medical decision making (see chart for details).    MDM Rules/Calculators/A&P                          49 year old male with history of diabetes type 2, HIV on HAART, ESRD on dialysis Monday Wednesday Friday, bilateral BKA's, admission in July with concern for MRSA bacteremia right stump cellulitis who presents with concern for cough, chills and fever.  CT shows no findings to suggest PE, septic emboli or clear pneumonia.  Bilateral mucus plugging.  Labs show leukocytosis, no other significant abnormalities.  Lactate is within normal limits.  Initial blood pressure normal after fluids with EMS, however decreased to the 40G systolic while in the emergency department.  His baseline blood pressures are 86P systolic per patient, and noted to be that on prior outpatient visits.  Given dialysis status, initially normal blood pressures and lactic acid, did not order 30 cc/kg of normal saline--- however, did  order 1 L as blood pressures decreased.  Given history of MRSA bacteremia and fever of unclear source, possible viral etiology or possible recurrent bacteremia, will admit for continued care.  He was given  vancomycin, cefepime and Flagyl.  Sepsis reassessment completed. Continues to have normal mentation and normal lactic acid however blood pressures persistently low despite continued fluid resuscitation of greater than 30cc/kg and midodrine 10mg  (he had his morning dose but not afternoon.)  He had dialysis today and has not shown any hypoxia or respiratory symptoms with fluid resuscitation.  Consulted PCCM who evaluated and will admit the patient.    Final Clinical Impression(s) / ED Diagnoses Final diagnoses:  Sepsis without acute organ dysfunction, due to unspecified organism ([place])  Fever, unspecified fever cause  Right-sided chest pain    Rx / DC Orders ED Discharge Orders    None        Gareth Morgan, MD 02/29/20 0004

## 2020-02-28 NOTE — ED Triage Notes (Signed)
Pt to 35 via EMS. Pt has had cough, sore throat and right sided chest pain x 4 days. Was seen yesterday at [facility] and prescribed antibiotic for PNA. Pt denies shortness of breath, has chest pain. EMS reports temp 102.8, BP=92/54, was 100/ after 598mL NS.Pt had dialysis today without difficulty. Pt is spanish speaking.

## 2020-02-28 NOTE — Progress Notes (Signed)
Pharmacy Antibiotic Note  Alvin Daniels is a 49 y.o. male admitted on 02/28/2020 with sepsis.  Pharmacy has been consulted for vancomycin and cefepime dosing. Pt is also on flagyl per primary team. Pt WBC 12.0.  Pt has history of HIV and MRSA bacteremia (09/2019) and reports sx of cough and non pleuritic chest pain.   Plan: Vancomycin 1250mg  IV x1 loading dose Vancomycin variable dosing (f/u inpatient dialysis schedule) Cefepime 2g IV load and 1g IV q24h thereafter  Flagyl per primary Monitor CBC, clinical progress, micro data, and renal function     No data recorded.  Recent Labs  Lab 02/27/20 1203  WBC 12.0*  CREATININE 9.54*    CrCl cannot be calculated (Unknown ideal weight.).    No Known Allergies  Antimicrobials this admission: Cefepime 10/18> vanc 10/18>   Microbiology results: 10/17 resp panel neg 10/17 strep neg 10/18 BCx ordered   Thank you for allowing pharmacy to be a part of this patient's care.  Carolin Guernsey  PGY1 pharmacy resident 02/28/2020 3:48 PM

## 2020-02-28 NOTE — ED Notes (Signed)
MDs at bedside conversing with pt via translation service.

## 2020-02-28 NOTE — Progress Notes (Signed)
[place] Progress Note Patient Name: Alvin Daniels DOB: Jul 16, 1970 MRN: [id number]   Date of Service  02/28/2020  HPI/Events of Note  ED doc discussed- ICU admit evaluation. 49 yr old  Male with hx of ESRD on HD, got HD today, hx of MRSA bacteremia in July, now with viral syndrome, CTPA neg for PNA or PE. LA normal. Sepsis with no definitive source, normal LA,  fluid unresponsive.  On room air, MAP still < 60. Got midodrine. Follow up LA pending. Hx of DM.  eICU Interventions  - notified NP Samantha . Team is going to ED to evaluate.      Intervention Category Intermediate Interventions: Communication with other healthcare providers and/or family  Elmer Sow 02/28/2020, 9:50 PM

## 2020-02-29 ENCOUNTER — Inpatient Hospital Stay (HOSPITAL_COMMUNITY): Payer: Self-pay

## 2020-02-29 DIAGNOSIS — R6521 Severe sepsis with septic shock: Secondary | ICD-10-CM

## 2020-02-29 DIAGNOSIS — I9589 Other hypotension: Secondary | ICD-10-CM

## 2020-02-29 DIAGNOSIS — A419 Sepsis, unspecified organism: Secondary | ICD-10-CM

## 2020-02-29 LAB — CBC
HCT: 26.5 % — ABNORMAL LOW (ref 39.0–52.0)
HCT: 29.6 % — ABNORMAL LOW (ref 39.0–52.0)
Hemoglobin: 8.7 g/dL — ABNORMAL LOW (ref 13.0–17.0)
Hemoglobin: 9.6 g/dL — ABNORMAL LOW (ref 13.0–17.0)
MCH: 34.2 pg — ABNORMAL HIGH (ref 26.0–34.0)
MCH: 34.3 pg — ABNORMAL HIGH (ref 26.0–34.0)
MCHC: 32.4 g/dL (ref 30.0–36.0)
MCHC: 32.8 g/dL (ref 30.0–36.0)
MCV: 104.3 fL — ABNORMAL HIGH (ref 80.0–100.0)
MCV: 105.3 fL — ABNORMAL HIGH (ref 80.0–100.0)
Platelets: 189 10*3/uL (ref 150–400)
Platelets: 198 10*3/uL (ref 150–400)
RBC: 2.54 MIL/uL — ABNORMAL LOW (ref 4.22–5.81)
RBC: 2.81 MIL/uL — ABNORMAL LOW (ref 4.22–5.81)
RDW: 13.1 % (ref 11.5–15.5)
RDW: 13.4 % (ref 11.5–15.5)
WBC: 10.5 10*3/uL (ref 4.0–10.5)
WBC: 12.1 10*3/uL — ABNORMAL HIGH (ref 4.0–10.5)
nRBC: 0 % (ref 0.0–0.2)
nRBC: 0 % (ref 0.0–0.2)

## 2020-02-29 LAB — BASIC METABOLIC PANEL
Anion gap: 20 — ABNORMAL HIGH (ref 5–15)
BUN: 25 mg/dL — ABNORMAL HIGH (ref 6–20)
CO2: 18 mmol/L — ABNORMAL LOW (ref 22–32)
Calcium: 8.3 mg/dL — ABNORMAL LOW (ref 8.9–10.3)
Chloride: 100 mmol/L (ref 98–111)
Creatinine, Ser: 6.44 mg/dL — ABNORMAL HIGH (ref 0.61–1.24)
GFR, Estimated: 9 mL/min — ABNORMAL LOW (ref >60)
Glucose, Bld: 191 mg/dL — ABNORMAL HIGH (ref 70–99)
Potassium: 4.1 mmol/L (ref 3.5–5.1)
Sodium: 138 mmol/L (ref 135–145)

## 2020-02-29 LAB — GLUCOSE, CAPILLARY
Glucose-Capillary: 177 mg/dL — ABNORMAL HIGH (ref 70–99)
Glucose-Capillary: 144 mg/dL — ABNORMAL HIGH (ref 70–99)

## 2020-02-29 LAB — CBG MONITORING, ED
Glucose-Capillary: 173 mg/dL — ABNORMAL HIGH (ref 70–99)
Glucose-Capillary: 167 mg/dL — ABNORMAL HIGH (ref 70–99)

## 2020-02-29 LAB — ECHOCARDIOGRAM COMPLETE
Area-P 1/2: 3.65 cm2
Height: 60 in
S' Lateral: 2.89 cm
Weight: 2080 oz

## 2020-02-29 LAB — CORTISOL: Cortisol, Plasma: 14.9 ug/dL

## 2020-02-29 LAB — PROCALCITONIN: Procalcitonin: 1.41 ng/mL

## 2020-02-29 LAB — MRSA PCR SCREENING: MRSA by PCR: NEGATIVE

## 2020-02-29 LAB — CREATININE, SERUM
Creatinine, Ser: 5.81 mg/dL — ABNORMAL HIGH (ref 0.61–1.24)
GFR, Estimated: 10 mL/min — ABNORMAL LOW (ref >60)

## 2020-02-29 LAB — GROUP A STREP BY PCR: Group A Strep by PCR: NOT DETECTED

## 2020-02-29 MED ORDER — CHLORHEXIDINE GLUCONATE CLOTH 2 % EX PADS
6.0000 | MEDICATED_PAD | Freq: Every day | CUTANEOUS | Status: DC
  Administered 2020-03-01: 6 via TOPICAL

## 2020-02-29 MED ORDER — ACETAMINOPHEN 325 MG PO TABS
650.0000 mg | ORAL_TABLET | Freq: Four times a day (QID) | ORAL | Status: DC | PRN
  Administered 2020-02-29 – 2020-03-01 (×3): 650 mg via ORAL
  Filled 2020-02-29 (×2): qty 2

## 2020-02-29 MED ORDER — SODIUM CHLORIDE 0.9 % IV SOLN
INTRAVENOUS | Status: DC | PRN
  Administered 2020-02-29: 1000 mL via INTRAVENOUS

## 2020-02-29 MED ORDER — VANCOMYCIN HCL IN DEXTROSE 500-5 MG/100ML-% IV SOLN: 500.0000 mg | INTRAVENOUS | Status: DC

## 2020-02-29 MED ORDER — CHOLESTYRAMINE LIGHT 4 G PO PACK
4.0000 g | PACK | Freq: Three times a day (TID) | ORAL | Status: DC
  Administered 2020-02-29 – 2020-03-02 (×4): 4 g via ORAL
  Filled 2020-02-29 (×8): qty 1

## 2020-02-29 MED ORDER — LACTATED RINGERS IV BOLUS
500.0000 mL | Freq: Once | INTRAVENOUS | Status: AC
  Administered 2020-02-29: 500 mL via INTRAVENOUS

## 2020-02-29 NOTE — Consult Note (Signed)
[facility] Renal Consultation Note    Indication for Consultation:  Management of ESRD/hemodialysis, anemia, hypertension/volume, and secondary hyperparathyroidism. PCP:  HPI: Alvin Daniels is a 49 y.o. male with ESRD, T2DM, HTN, PAD (s/p B BKA), and HIV who was admitted with sepsis.  Had presented to [facility] ED on 10/17 with c/o cough x R sided CP x 2 weeks. Flu/COVID negative. Stable labs. CXR without pneumonia - discharged home with oral antibiotics. He went to his HD session on 10/18 where he was noted to be hypotensive and febrile with chills. He came back to the ED on 10/18 evening. CP was worse with deep inspiration. He was tachycardic, hypotensive, and febrile. The decision was made to admit with sepsis. BCx drawn and started empirically on Vanc/Cefepime/Flagyl. CTA completed which does not show PE, but does show B mucus plugging. Labs this morning with Na 138, K 4.1, WBC 12.1, Hgb 9.6, LA 1, Pro-calcitonin 1.41. Rapid strep was negative. Flu/COVID testing was negative on 10/17 and 10/18. For his hypotension, he has been given a lot of fluid (looks like 4L per my math) + dose of midodrine.  Today - he is feeling a little better, but still with chest pressure and now with diarrhea.  Dialyzes on MWF schedule at [facility] center. He completed HD yesterday - next due tomorrow. Uses LUE AVF as access - no recent wounds or issues.    Past Medical History:  Diagnosis Date  . AIDS ([place]) 11/22/2014  . Back pain 06/09/2019  . BKA stump complication ([place]) 5/95/6387  . Chronic diarrhea   . Chronic hepatitis C without hepatic coma ([place]) 11/22/2014  . 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 Diabetic neuropathy ([place])   . ESRD (end stage renal disease) on dialysis [facility])    "TTS; don't remember street name" (05/03/2014)  . Hepatitis C   . HIV INFECTION 06/27/2010   Qualifier: Diagnosis of  By: Nickola Major CMA ( [place]), Geni Bers    . Hypotension 06/02/2012  . Metabolic  bone disease 09/16/4330  . MRSA infection   . Normocytic anemia 06/17/2012  . Pancreatitis   . Pressure ulcer of BKA stump, stage 2 ([place]) 11/22/2014  . Renal disorder   . Severe protein-calorie malnutrition ([place]) 06/17/2012  . Uncontrolled diabetes mellitus with complications ([place]) 9/51/8841   Annotation: uncontrolled Qualifier: Diagnosis of  By: Nickola Major CMA Deborra Medina), Geni Bers     Past Surgical History:  Procedure Laterality Date  . AMPUTATION Left 04/20/2014   Procedure: 3rd toe amputation, 4th Toe Amputation,  5th Toe Amputation;  Surgeon: Newt Minion, MD;  Location: [place];  Service: Orthopedics;  Laterality: Left;  . AMPUTATION Left 05/02/2014   Procedure: Midfoot Amputation;  Surgeon: Newt Minion, MD;  Location: [place];  Service: Orthopedics;  Laterality: Left;  . AMPUTATION Left 06/17/2014   Procedure: AMPUTATION BELOW KNEE;  Surgeon: Newt Minion, MD;  Location: [place];  Service: Orthopedics;  Laterality: Left;  . AMPUTATION Right 09/04/2018   Procedure: RIGHT BELOW KNEE AMPUTATION;  Surgeon: Newt Minion, MD;  Location: [place];  Service: Orthopedics;  Laterality: Right;  RIGHT BELOW KNEE AMPUTATION  . AV FISTULA PLACEMENT Left   . AV FISTULA PLACEMENT Left 05/10/2016   Procedure: Creation Left Arm Brachiocephalic Arteriovenous Fistula and Ligation of Radiocephalic Fistula;  Surgeon: Angelia Mould, MD;  Location: [place];  Service: Vascular;  Laterality: Left;  . FEMUR IM NAIL Left 08/17/2016   Procedure: INTRAMEDULLARY (IM) RETROGRADE  FEMORAL NAILING;  Surgeon: Marybelle Killings, MD;  Location: [place];  Service: Orthopedics;  Laterality: Left;  . FOOT AMPUTATION THROUGH ANKLE Left 12/'21/2015   midfoot  . IR GENERIC HISTORICAL Left 05/01/2016   IR THROMBECTOMY AV FISTULA W/THROMBOLYSIS/PTA INC/SHUNT/IMG LEFT 05/01/2016 [name], MD [facility]  . IR GENERIC HISTORICAL  05/01/2016   IR US GUIDE VASC ACCESS LEFT 05/01/2016 [name], MD [facility]  . IR GENERIC  HISTORICAL  05/07/2016   IR FLUORO GUIDE CV LINE RIGHT 05/07/2016 Corrie Mckusick, DO [facility]  . IR GENERIC HISTORICAL  05/07/2016   IR US GUIDE VASC ACCESS RIGHT 05/07/2016 Corrie Mckusick, DO [facility]  . IR GENERIC HISTORICAL  05/22/2016   IR US GUIDE VASC ACCESS RIGHT 05/22/2016 Greggory Keen, MD [facility]  . IR GENERIC HISTORICAL  05/22/2016   IR FLUORO GUIDE CV LINE RIGHT 05/22/2016 Greggory Keen, MD [facility]  . IR REMOVAL TUN CV CATH W/O FL  08/21/2016  . PERIPHERAL VASCULAR CATHETERIZATION Left 05/09/2016   Procedure: A/V Fistulagram;  Surgeon: Angelia Mould, MD;  Location: [place] CV LAB;  Service: Cardiovascular;  Laterality: Left;  arm  . TEE WITHOUT CARDIOVERSION N/A 06/22/2018   Procedure: TRANSESOPHAGEAL ECHOCARDIOGRAM (TEE);  Surgeon: Jerline Pain, MD;  Location: [facility];  Service: Cardiovascular;  Laterality: N/A;   Family History  Problem Relation Age of Onset  . Diabetes Mother   . Diabetes Father    Social History:  reports that he has never smoked. He has never used smokeless tobacco. He reports that he does not drink alcohol and does not use drugs.  ROS: As per HPI otherwise negative.  Physical Exam: Vitals:   02/29/20 0755 02/29/20 0916 02/29/20 1130 02/29/20 1200  BP: (!) 93/56 (!) 89/60 104/60 108/62  Pulse: 87 89 88 95  Resp: 20 (!) 28 19 (!) 21  Temp:      TempSrc:      SpO2: 96% 97% 96% 100%  Weight:      Height:         General: Chronically ill man, NAD. Head: Normocephalic, atraumatic, sclera non-icteric, mucus membranes are moist. Neck: Supple without lymphadenopathy/masses.  Lungs: Reduced air movement in B bases, rhonchi in mid lungs Heart: RRR with normal S1, S2. No murmurs, rubs, or gallops appreciated. Abdomen: Soft, non-tender, non-distended with normoactive bowel sounds.  Musculoskeletal:  Strength and tone appear normal for age. Lower extremities: B BKA, no stump edema. Neuro: Alert and oriented X 3. Moves all  extremities spontaneously. Psych:  Responds to questions appropriately with a normal affect. Dialysis Access: LUE AVF + bruit  No Known Allergies Prior to Admission medications   Medication Sig Start Date End Date Taking? Authorizing Provider  bictegravir-emtricitabine-tenofovir AF (BIKTARVY) 50-200-25 MG TABS tablet Take 1 tablet by mouth daily. 06/09/19  Yes Tommy Medal, Lavell Islam, MD  Blood Glucose Monitoring Suppl (TRUE METRIX METER) DEVI 1 each by Does not apply route 3 (three) times daily. 09/15/18  Yes Newlin, Enobong, MD  glucose blood (TRUE METRIX BLOOD GLUCOSE TEST) test strip 1 each by Other route 3 (three) times daily. 09/15/18  Yes [name], MD  insulin NPH-regular Human (HUMULIN 70/30) (70-30) 100 UNIT/ML injection Inject 15 Units into the skin 2 (two) times daily before a meal. Patient taking differently: Inject 9 Units into the skin 2 (two) times daily before a meal.  02/10/20  Yes Newlin, Enobong, MD  Insulin Syringe-Needle U-100 (BD INSULIN SYRINGE ULTRAFINE) 31G X 15/64" 0.5  ML MISC 1 each by Does not apply route 2 (two) times daily. 02/10/20  Yes [name], MD  midodrine (PROAMATINE) 10 MG tablet Take 1 tablet (10 mg total) by mouth 3 (three) times daily with meals. 12/10/19  Yes Regalado, Belkys A, MD  TRUEplus Lancets 28G MISC 1 each by Does not apply route 3 (three) times daily. 09/15/18  Yes [name], MD   Current Facility-Administered Medications  Medication Dose Route Frequency Provider Last Rate Last Admin  . albuterol (PROVENTIL) (2.5 MG/3ML) 0.083% nebulizer solution 2.5 mg  2.5 mg Nebulization Q2H PRN Audria Nine, DO      . bictegravir-emtricitabine-tenofovir AF (BIKTARVY) 50-200-25 MG per tablet 1 tablet  1 tablet Oral Daily Audria Nine, DO   1 tablet at 02/29/20 0905  . ceFEPIme (MAXIPIME) 1 g in sodium chloride 0.9 % 100 mL IVPB  1 g Intravenous Q24H Audria Nine, DO      . docusate sodium (COLACE) capsule 100 mg  100 mg Oral BID PRN  Audria Nine, DO      . heparin injection 5,000 Units  5,000 Units Subcutaneous Q8H Audria Nine, DO   5,000 Units at 02/29/20 0630  . insulin aspart (novoLOG) injection 1-3 Units  1-3 Units Subcutaneous TID WC & HS Audria Nine, DO   2 Units at 02/29/20 0906  . midodrine (PROAMATINE) tablet 10 mg  10 mg Oral TID WC Audria Nine, DO   10 mg at 02/29/20 0906  . ondansetron (ZOFRAN) injection 4 mg  4 mg Intravenous Q6H PRN Audria Nine, DO      . polyethylene glycol (MIRALAX / GLYCOLAX) packet 17 g  17 g Oral Daily PRN Audria Nine, DO       Current Outpatient Medications  Medication Sig Dispense Refill  . bictegravir-emtricitabine-tenofovir AF (BIKTARVY) 50-200-25 MG TABS tablet Take 1 tablet by mouth daily. 30 tablet 11  . Blood Glucose Monitoring Suppl (TRUE METRIX METER) DEVI 1 each by Does not apply route 3 (three) times daily. 1 Device 0  . glucose blood (TRUE METRIX BLOOD GLUCOSE TEST) test strip 1 each by Other route 3 (three) times daily. 100 each 12  . insulin NPH-regular Human (HUMULIN 70/30) (70-30) 100 UNIT/ML injection Inject 15 Units into the skin 2 (two) times daily before a meal. (Patient taking differently: Inject 9 Units into the skin 2 (two) times daily before a meal. ) 10 mL 0  . Insulin Syringe-Needle U-100 (BD INSULIN SYRINGE ULTRAFINE) 31G X 15/64" 0.5 ML MISC 1 each by Does not apply route 2 (two) times daily. 100 each 5  . midodrine (PROAMATINE) 10 MG tablet Take 1 tablet (10 mg total) by mouth 3 (three) times daily with meals. 90 tablet 3  . TRUEplus Lancets 28G MISC 1 each by Does not apply route 3 (three) times daily. 100 each 11   Labs: Basic Metabolic Panel: Recent Labs  Lab 02/27/20 1203 02/27/20 1203 02/28/20 1722 02/28/20 2330 02/29/20 0443  NA 136  --  137  --  138  K 4.5  --  3.7  --  4.1  CL 94*  --  91*  --  100  CO2 25  --  26  --  18*  GLUCOSE 145*  --  167*  --  191*  BUN 37*  --  14  --  25*  CREATININE 9.54*   < >  5.52* 5.81* 6.44*  CALCIUM 9.4  --  8.3*  --  8.3*   < > = values  in this interval not displayed.   Liver Function Tests: Recent Labs  Lab 02/28/20 1722  AST 15  ALT 9  ALKPHOS 72  BILITOT 1.5*  PROT 7.8  ALBUMIN 2.9*   CBC: Recent Labs  Lab 02/27/20 1203 02/27/20 1203 02/28/20 1722 02/28/20 2330 02/29/20 0443  WBC 12.0*   < > 12.6* 10.5 12.1*  NEUTROABS  --   --  11.7*  --   --   HGB 10.2*   < > 9.5* 8.7* 9.6*  HCT 29.2*   < > 28.8* 26.5* 29.6*  MCV 98.0  --  101.1* 104.3* 105.3*  PLT 160   < > 197 189 198   < > = values in this interval not displayed.   Studies/Results: CT Angio Chest PE W and/or Wo Contrast  Result Date: 02/28/2020 CLINICAL DATA:  Bacteremia, possible septic emboli EXAM: CT ANGIOGRAPHY CHEST WITH CONTRAST TECHNIQUE: Multidetector CT imaging of the chest was performed using the standard protocol during bolus administration of intravenous contrast. Multiplanar CT image reconstructions and MIPs were obtained to evaluate the vascular anatomy. CONTRAST:  78mL OMNIPAQUE IOHEXOL 350 MG/ML SOLN COMPARISON:  Chest x-ray from the previous day, 09/27/2019. FINDINGS: Cardiovascular: Thoracic aorta and its branches are well visualized and within normal limits. No aneurysmal dilatation or dissection is noted. Mild atherosclerotic calcifications are seen. No cardiac enlargement is seen. Coronary calcifications are noted. The pulmonary artery shows a normal branching pattern. No focal filling defect to suggest pulmonary embolism is noted. Mediastinum/Nodes: Thoracic inlet is within normal limits. No sizable hilar or mediastinal adenopathy is noted. Calcified lymph nodes are seen consistent with prior granulomatous disease. The esophagus as visualized is within normal limits. Lungs/Pleura: The lungs are well aerated bilaterally. Some changes of mucous plugging in the lower lobe bronchial tree is noted on the right. Some associated atelectatic changes are noted. Similar changes  are noted in the left lower lobe. No findings to suggest septic emboli are seen. No pneumothorax or sizable effusion is noted. Upper Abdomen: Visualized upper abdomen is unremarkable. Stable calcifications along the corticomedullary junction in the kidneys are seen similar to prior CT examination. Musculoskeletal: Mild degenerative change of the thoracic spine is noted. No acute bony abnormality is noted. Review of the MIP images confirms the above findings. IMPRESSION: No evidence of pulmonary emboli. No findings to suggest septic emboli Changes of prior granulomatous disease. Mucous plugging in the lower lobes bilaterally with associated atelectatic changes. 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 Aortic Atherosclerosis (ICD10-I70.0). Electronically Signed   By: Inez Catalina M.D.   On: 02/28/2020 17:50   Dialysis Orders: MWF at [facility] -> last HD 10/18 4hr, 400/A1.5, EDW 48kg, 2K/2Ca, AVF, heparin 2000 bolus - Hectoral 55mcg IV q HD - Mircera 56mcg IV q 2 weeks (last 10/15)  Assessment/Plan: 1.  Sepsis: Unclear source - viral v. bacteremia. No pneumonia. No obvious skin wounds. Hx MRSA bacteremia in 11/2019. BCx pending. On Vanc/Cefepime/Flagyl. Has been given ~4L fluids so far -- pls do not give any more fluids d/t ESRD status. 2.  ESRD: Will plan on HD tomorrow - per usual MWF schedule.  3.  Hypotension/volume: Chronically hypotensive, although acutely worse. No excess volume on exam - unclear if most recent weight is correct. 4.  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 5.  Metabolic bone disease: CorrCa ok, Phos pending. 6. T2DM 7.  HIV  Veneta Penton, Hershal Coria 02/29/2020, 12:44 PM  [facility]

## 2020-02-29 NOTE — Progress Notes (Signed)
[place] Progress Note Patient Name: Alvin Daniels DOB: [birth year] MRN: [id number]   Date of Service  02/29/2020  HPI/Events of Note  Diarrhea- Request for Imodium.   eICU Interventions  Plan: 1. Stool for C. Difficile PCR. 2. Enteric precautions.  4. Questran 4 gm PO TID.     Intervention Category Major Interventions: Other:  Lysle Dingwall 02/29/2020, 10:29 PM

## 2020-02-29 NOTE — Progress Notes (Signed)
  Echocardiogram 2D Echocardiogram has been performed.  Kimberlee Shoun G Srinidhi Landers 02/29/2020, 1:27 PM

## 2020-02-29 NOTE — Progress Notes (Signed)
Pharmacy Antibiotic Note  Alvin Daniels is a 49 y.o. male admitted on 02/28/2020 with sepsis.  Pharmacy has been consulted for vancomycin and cefepime dosing. Pt with Tmax 100.9 and WBC is elevated at 12.1. Pt has a history of MRSA bacteremia and recently completed a course of vancomycin.   Plan: Vancomycin 500mg  with each HD Continue cefepime 1gm IV Q24H F/u renal plans, C&S, clinical status and pre-HD vanc level PRN  Height: 5' (152.4 cm) Weight: 59 kg (130 lb) IBW/kg (Calculated) : 50  Temp (24hrs), Avg:100.1 F (37.8 C), Min:98.8 F (37.1 C), Max:100.9 F (38.3 C)  Recent Labs  Lab 02/27/20 1203 02/28/20 1722 02/28/20 2127 02/28/20 2330 02/29/20 0443  WBC 12.0* 12.6*  --  10.5 12.1*  CREATININE 9.54* 5.52*  --  5.81* 6.44*  LATICACIDVEN  --  1.0 1.0  --   --     Estimated Creatinine Clearance: 9.8 mL/min (A) (by C-G formula based on SCr of 6.44 mg/dL (H)).    No Known Allergies  Antimicrobials this admission: Cefepime 10/18> vanc 10/18>   Dose adjustments this admission: N/A  Microbiology results: Pending  Thank you for allowing pharmacy to be a part of this patient's care.  Acy Orsak, Rande Lawman 02/29/2020 8:17 AM

## 2020-02-29 NOTE — Progress Notes (Addendum)
Patient arrive from ED to 4N.  Interpretor Graciela paged and at bedside to translate.  Assessment completed via translation and all questions answer.    Patient arrived with 2 prosthetic leg, cell phone, jacket and pants (on patient).

## 2020-02-29 NOTE — Progress Notes (Signed)
NAMEJayvon Daniels, MRN:  [id number], DOB:  1970-09-25, LOS: 1 ADMISSION DATE:  02/28/2020, CONSULTATION DATE:  02/28/2020 REFERRING MD:  Gareth Morgan MD CHIEF COMPLAINT:  Sore throat  Brief History   49 yo male with PMH significant for HIV, DM2, ESRD with HD MWF, BLE amputation admitted with hypotension.  History of present illness   History obtained using interpreter.  Patient hospitalized in July for MRSA bacteremia, ECHO at that time without vegetation, and discharged on 7/24 with 6 weeks of IV Vancomycin.    Patient presented to ED on 10/17 with complaints of sore throat, cough, and chest pain x 4 days.  He received Amoxicillin and discharged home.  Today, he went to HD where he developed chills and generalized feeling unwell. EMS was called and patient was noted to be tachycardic and febrile at 102. He was transported to [facility] ED.  He received 500 ml NS bolus by EMS.  Patient was noted to be persistently hypotensive in the ED and [facility] consulted to evaluate for admission. On arrival to evaluate patient in the ED, he is alert and oriented, no focal deficits on exam, his only complaints are sore throat and cough.  He denies any nausea/vomiting/abdominal pain. No evidence of cellulitis to bilateral stumps, no recent rash.  Patient was unable to state his dry weight or how much fluid removed at HD today.  Labs notable for mildly elevated wbc: 12, hgb 9.5, plt 197, BG 167.  Lactate 1.    Past Medical History  HIV DM, Type II ESRD on HD MWF --LUE access BLE amputations  Significant Hospital Events   10/18: Admit to [facility]  Consults:  10/18: PCCM  Procedures:    Significant Diagnostic Tests:  CTA Chest 10/18: No evidence of pulmonary embolism, small amount of atelectasis to lower lobes.  EKG 10/18: No ST elevation  Micro Data:  Group A Strep 02/27/2020: Not detected COVID/Influenza 02/27/2020: Negative Blood Cx: 10/18: Pending  Antimicrobials:  Vanc 10/18  >> Cefepime 10/18 >>  Interim history/subjective:  Admitted overnight. Remains in the ED. SBP ranging in the 80s to low-90s. Good mentation. Tmax 100.9  Objective   Blood pressure (!) 87/56, pulse 86, temperature (!) 100.6 F (38.1 C), temperature source Oral, resp. rate 10, height 5' (1.524 m), weight 59 kg, SpO2 93 %.        Intake/Output Summary (Last 24 hours) at 02/29/2020 0704 Last data filed at 02/28/2020 2219 Gross per 24 hour  Intake 2950 ml  Output --  Net 2950 ml   Filed Weights   02/28/20 1602  Weight: 59 kg   Physical Exam: General: 49-year-appearing male laying in stretcher, no acute distress HENT: [place], AT, OP clear, MMM Eyes: EOMI, no scleral icterus Respiratory: Clear to auscultation bilaterally.  No crackles, wheezing or rales Cardiovascular: RRR, -M/R/G, no JVD GI: BS+, soft, nontender Extremities:Bilateral LE amputation. LUE access Neuro: AAO x4, CNII-XII grossly intact  Resolved Hospital Problem list     Assessment & Plan:  Hypotension - persistent -- Etiology: Suspect multifactorial with missed midodrine doses today, HD and possible infection -- Repeat 500cc LR bolus -- Resume home Midodrine: 10 mg TID -- Blood Cx pending, Strep neg -- DC Vanc. Continue Cefepime -- Echocardiogram ordered -- Goal MAP > 55. He is showing no evidence of mal perfusion on exam and lactate normal.   DM, type II -- SSI   HIV --Continue home [place]  ESRD -- Will consult Nephrology. Dialysis per consult team Best  practice:  Diet: As tolerated Pain/Anxiety/Delirium protocol (if indicated): n/a VAP protocol (if indicated): n/a DVT prophylaxis: heparin subq GI prophylaxis: n/a Glucose control: SSI Mobility: Ambulate with assistance Code Status: Discussed with patient, FULL CODE.  Disposition: Admit to ICU with expected length of stay > 2 nights.   Labs   CBC: Recent Labs  Lab 02/27/20 1203 02/28/20 1722 02/28/20 2330 02/29/20 0443  WBC 12.0* 12.6* 10.5  12.1*  NEUTROABS  --  11.7*  --   --   HGB 10.2* 9.5* 8.7* 9.6*  HCT 29.2* 28.8* 26.5* 29.6*  MCV 98.0 101.1* 104.3* 105.3*  PLT 160 197 189 768    Basic Metabolic Panel: Recent Labs  Lab 02/27/20 1203 02/28/20 1722 02/28/20 2330 02/29/20 0443  NA 136 137  --  138  K 4.5 3.7  --  4.1  CL 94* 91*  --  100  CO2 25 26  --  18*  GLUCOSE 145* 167*  --  191*  BUN 37* 14  --  25*  CREATININE 9.54* 5.52* 5.81* 6.44*  CALCIUM 9.4 8.3*  --  8.3*   GFR: Estimated Creatinine Clearance: 9.8 mL/min (A) (by C-G formula based on SCr of 6.44 mg/dL (H)). Recent Labs  Lab 02/27/20 1203 02/28/20 1722 02/28/20 2127 02/28/20 2330 02/29/20 0443  PROCALCITON  --   --   --  1.41  --   WBC 12.0* 12.6*  --  10.5 12.1*  LATICACIDVEN  --  1.0 1.0  --   --     Liver Function Tests: Recent Labs  Lab 02/28/20 1722  AST 15  ALT 9  ALKPHOS 72  BILITOT 1.5*  PROT 7.8  ALBUMIN 2.9*   No results for input(s): LIPASE, AMYLASE in the last 168 hours. No results for input(s): AMMONIA in the last 168 hours.  ABG    Component Value Date/Time   PHART 7.460 (H) 03/03/2016 1439   PCO2ART 36.9 03/03/2016 1439   PO2ART 34.0 (LL) 03/03/2016 1439   HCO3 26.2 03/03/2016 1439   TCO2 30 05/28/2016 1414   TCO2 30 05/28/2016 1414   O2SAT 69.0 03/03/2016 1439     Coagulation Profile: Recent Labs  Lab 02/28/20 1722  INR 1.2    Cardiac Enzymes: No results for input(s): CKTOTAL, CKMB, CKMBINDEX, TROPONINI in the last 168 hours.  HbA1C: HbA1c, POC (controlled diabetic range)  Date/Time Value Ref Range Status  09/28/2018 02:21 PM 6.8 0.0 - 7.0 % Final   Hgb A1c MFr Bld  Date/Time Value Ref Range Status  12/02/2019 03:43 AM 6.9 (H) 4.8 - 5.6 % Final    Comment:    (NOTE) Pre diabetes:          5.7%-6.4%  Diabetes:              >6.4%  Glycemic control for   <7.0% adults with diabetes   06/18/2018 06:47 AM 9.0 (H) 4.8 - 5.6 % Final    Comment:    (NOTE) Pre diabetes:           5.7%-6.4% Diabetes:              >6.4% Glycemic control for   <7.0% adults with diabetes     CBG: No results for input(s): GLUCAP in the last 168 hours.  The patient is critically ill with multiple organ systems failure and requires high complexity decision making for assessment and support, frequent evaluation and titration of therapies, application of advanced monitoring technologies and extensive interpretation of multiple databases.  Independent Critical Care Time: 35 Minutes.   Rodman Pickle, M.D. [facility] Pulmonary/Critical Care Medicine 02/29/2020 7:05 AM   Please see Amion for pager number to reach on-call Pulmonary and Critical Care Team.

## 2020-03-01 DIAGNOSIS — N186 End stage renal disease: Secondary | ICD-10-CM

## 2020-03-01 DIAGNOSIS — J069 Acute upper respiratory infection, unspecified: Principal | ICD-10-CM

## 2020-03-01 LAB — C DIFFICILE QUICK SCREEN W PCR REFLEX
C Diff antigen: NEGATIVE
C Diff interpretation: NOT DETECTED
C Diff toxin: NEGATIVE

## 2020-03-01 LAB — RENAL FUNCTION PANEL
Albumin: 2.3 g/dL — ABNORMAL LOW (ref 3.5–5.0)
Anion gap: 20 — ABNORMAL HIGH (ref 5–15)
BUN: 45 mg/dL — ABNORMAL HIGH (ref 6–20)
CO2: 16 mmol/L — ABNORMAL LOW (ref 22–32)
Calcium: 9.1 mg/dL (ref 8.9–10.3)
Chloride: 99 mmol/L (ref 98–111)
Creatinine, Ser: 10.27 mg/dL — ABNORMAL HIGH (ref 0.61–1.24)
GFR, Estimated: 5 mL/min — ABNORMAL LOW (ref >60)
Glucose, Bld: 107 mg/dL — ABNORMAL HIGH (ref 70–99)
Phosphorus: 6.7 mg/dL — ABNORMAL HIGH (ref 2.5–4.6)
Potassium: 3.9 mmol/L (ref 3.5–5.1)
Sodium: 135 mmol/L (ref 135–145)

## 2020-03-01 LAB — GLUCOSE, CAPILLARY
Glucose-Capillary: 147 mg/dL — ABNORMAL HIGH (ref 70–99)
Glucose-Capillary: 138 mg/dL — ABNORMAL HIGH (ref 70–99)
Glucose-Capillary: 111 mg/dL — ABNORMAL HIGH (ref 70–99)
Glucose-Capillary: 90 mg/dL (ref 70–99)

## 2020-03-01 LAB — CBC
HCT: 27.4 % — ABNORMAL LOW (ref 39.0–52.0)
Hemoglobin: 9.2 g/dL — ABNORMAL LOW (ref 13.0–17.0)
MCH: 33.6 pg (ref 26.0–34.0)
MCHC: 33.6 g/dL (ref 30.0–36.0)
MCV: 100 fL (ref 80.0–100.0)
Platelets: 226 10*3/uL (ref 150–400)
RBC: 2.74 MIL/uL — ABNORMAL LOW (ref 4.22–5.81)
RDW: 13.5 % (ref 11.5–15.5)
WBC: 11.1 10*3/uL — ABNORMAL HIGH (ref 4.0–10.5)
nRBC: 0 % (ref 0.0–0.2)

## 2020-03-01 MED ORDER — SODIUM CHLORIDE 0.9 % IV SOLN: 100.0000 mL | INTRAVENOUS | Status: DC | PRN

## 2020-03-01 MED ORDER — VANCOMYCIN HCL IN DEXTROSE 500-5 MG/100ML-% IV SOLN
500.0000 mg | INTRAVENOUS | Status: DC
  Filled 2020-03-01: qty 100

## 2020-03-01 MED ORDER — ALBUMIN HUMAN 25 % IV SOLN
INTRAVENOUS | Status: AC
  Administered 2020-03-01: 25 g via INTRAVENOUS
  Filled 2020-03-01: qty 100

## 2020-03-01 MED ORDER — ALBUMIN HUMAN 25 % IV SOLN: 25.0000 g | Freq: Once | INTRAVENOUS | Status: AC

## 2020-03-01 MED ORDER — PHENOL 1.4 % MT LIQD: 1.0000 | OROMUCOSAL | Status: DC | PRN

## 2020-03-01 MED ORDER — LIDOCAINE HCL (PF) 1 % IJ SOLN: 5.0000 mL | INTRAMUSCULAR | Status: DC | PRN

## 2020-03-01 MED ORDER — LIDOCAINE-PRILOCAINE 2.5-2.5 % EX CREA: 1.0000 "application " | TOPICAL_CREAM | CUTANEOUS | Status: DC | PRN

## 2020-03-01 MED ORDER — MIDODRINE HCL 5 MG PO TABS
ORAL_TABLET | ORAL | Status: AC
  Filled 2020-03-01: qty 2

## 2020-03-01 MED ORDER — PENTAFLUOROPROP-TETRAFLUOROETH EX AERO: 1.0000 "application " | INHALATION_SPRAY | CUTANEOUS | Status: DC | PRN

## 2020-03-01 NOTE — Progress Notes (Signed)
[facility] Progress Note   Subjective:  Seen in room. C/o sore throat and diarrhea. No dyspnea. BP good this morning. For HD later today.  Objective Vitals:   03/01/20 0600 03/01/20 0700 03/01/20 0800 03/01/20 0900  BP: (!) 88/52 (!) 88/53 (!) 92/56 102/66  Pulse: 76 75 84 88  Resp:      Temp:   98.1 F (36.7 C)   TempSrc:   Oral   SpO2: 98% 97% 97% 98%  Weight:      Height:       Physical Exam General: Well appearing man, NAD. Room air. Heart: RRR; no murmur Lungs: Reduced air movement in B bases, CTA upper lobes Abdomen: soft, non-tender Extremities: B BKA, no stump edema Dialysis Access: LUE AVF + bruit  Additional Objective Labs: Basic Metabolic Panel: Recent Labs  Lab 02/27/20 1203 02/27/20 1203 02/28/20 1722 02/28/20 2330 02/29/20 0443  NA 136  --  137  --  138  K 4.5  --  3.7  --  4.1  CL 94*  --  91*  --  100  CO2 25  --  26  --  18*  GLUCOSE 145*  --  167*  --  191*  BUN 37*  --  14  --  25*  CREATININE 9.54*   < > 5.52* 5.81* 6.44*  CALCIUM 9.4  --  8.3*  --  8.3*   < > = values in this interval not displayed.   Liver Function Tests: Recent Labs  Lab 02/28/20 1722  AST 15  ALT 9  ALKPHOS 72  BILITOT 1.5*  PROT 7.8  ALBUMIN 2.9*   CBC: Recent Labs  Lab 02/27/20 1203 02/27/20 1203 02/28/20 1722 02/28/20 2330 02/29/20 0443  WBC 12.0*   < > 12.6* 10.5 12.1*  NEUTROABS  --   --  11.7*  --   --   HGB 10.2*   < > 9.5* 8.7* 9.6*  HCT 29.2*   < > 28.8* 26.5* 29.6*  MCV 98.0  --  101.1* 104.3* 105.3*  PLT 160   < > 197 189 198   < > = values in this interval not displayed.   Blood Culture    Component Value Date/Time   SDES BLOOD RIGHT WRIST 02/28/2020 1720   SPECREQUEST  02/28/2020 1720    BOTTLES DRAWN AEROBIC AND ANAEROBIC Blood Culture results may not be optimal due to an inadequate volume of blood received in culture bottles   CULT  02/28/2020 1720    NO GROWTH < 24 HOURS Performed at [facility], [place] [address], [place], [place] [address]    REPTSTATUS PENDING 02/28/2020 1720   Studies/Results: CT Angio Chest PE W and/or Wo Contrast  Result Date: 02/28/2020 CLINICAL DATA:  Bacteremia, possible septic emboli EXAM: CT ANGIOGRAPHY CHEST WITH CONTRAST TECHNIQUE: Multidetector CT imaging of the chest was performed using the standard protocol during bolus administration of intravenous contrast. Multiplanar CT image reconstructions and MIPs were obtained to evaluate the vascular anatomy. CONTRAST:  19mL OMNIPAQUE IOHEXOL 350 MG/ML SOLN COMPARISON:  Chest x-ray from the previous day, 09/27/2019. FINDINGS: Cardiovascular: Thoracic aorta and its branches are well visualized and within normal limits. No aneurysmal dilatation or dissection is noted. Mild atherosclerotic calcifications are seen. No cardiac enlargement is seen. Coronary calcifications are noted. The pulmonary artery shows a normal branching pattern. No focal filling defect to suggest pulmonary embolism is noted. Mediastinum/Nodes: Thoracic inlet is within normal limits. No sizable hilar or  mediastinal adenopathy is noted. Calcified lymph nodes are seen consistent with prior granulomatous disease. The esophagus as visualized is within normal limits. Lungs/Pleura: The lungs are well aerated bilaterally. Some changes of mucous plugging in the lower lobe bronchial tree is noted on the right. Some associated atelectatic changes are noted. Similar changes are noted in the left lower lobe. No findings to suggest septic emboli are seen. No pneumothorax or sizable effusion is noted. Upper Abdomen: Visualized upper abdomen is unremarkable. Stable calcifications along the corticomedullary junction in the kidneys are seen similar to prior CT examination. Musculoskeletal: Mild degenerative change of the thoracic spine is noted. No acute bony abnormality is noted. Review of the MIP images confirms the above findings. IMPRESSION: No evidence of pulmonary emboli. No  findings to suggest septic emboli Changes of prior granulomatous disease. Mucous plugging in the lower lobes bilaterally with associated atelectatic changes. 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 Aortic Atherosclerosis (ICD10-I70.0). Electronically Signed   By: Inez Catalina M.D.   On: 02/28/2020 17:50   ECHOCARDIOGRAM COMPLETE  Result Date: 02/29/2020    ECHOCARDIOGRAM REPORT   Patient Name:   Alvin Daniels Date of Exam: 02/29/2020 Medical Rec #:  [id number]              Height:       60.0 in Accession #:    [id number]             Weight:       130.0 lb Date of Birth:  04-Feb-1971               BSA:          1.554 m Patient Age:    49 years               BP:           93/56 mmHg Patient Gender: M                      HR:           93 bpm. Exam Location:  Inpatient Procedure: 2D Echo, Cardiac Doppler and Color Doppler Indications:    Hypotension 101751  History:        Patient has prior history of Echocardiogram examinations. Risk                 Factors:Hypertension and Diabetes. ESRD.  Sonographer:    Jonelle Sidle Dance Referring Phys: [id number] CHI JANE ELLISON IMPRESSIONS  1. Left ventricular ejection fraction, by estimation, is 60 to 65%. The left ventricle has normal function. The left ventricle has no regional wall motion abnormalities. Left ventricular diastolic parameters were normal.  2. Right ventricular systolic function is normal. The right ventricular size is normal.  3. The mitral valve is grossly normal. No evidence of mitral valve regurgitation.  4. The aortic valve is grossly normal. Aortic valve regurgitation is not visualized.  5. The inferior vena cava is normal in size with <50% respiratory variability, suggesting right atrial pressure of 8 mmHg. Comparison(s): A prior study was performed on 12/06/19. Compared with prior, LV is more robust. FINDINGS  Left Ventricle: Left ventricular ejection fraction, by estimation, is 60 to 65%. The left ventricle has  normal function. The left ventricle has no regional wall motion abnormalities. The left ventricular internal cavity size was normal in size. There is  no left ventricular hypertrophy. Left ventricular diastolic parameters were normal. Right Ventricle: The right ventricular size is normal.  No increase in right ventricular wall thickness. Right ventricular systolic function is normal. Left Atrium: Left atrial size was normal in size. Right Atrium: Right atrial size was normal in size. Pericardium: There is no evidence of pericardial effusion. Mitral Valve: The mitral valve is grossly normal. No evidence of mitral valve regurgitation. Tricuspid Valve: The tricuspid valve is grossly normal. Tricuspid valve regurgitation is trivial. Aortic Valve: The aortic valve is grossly normal. Aortic valve regurgitation is not visualized. Pulmonic Valve: The pulmonic valve was not well visualized. Pulmonic valve regurgitation is not visualized. Aorta: The aortic root is normal in size and structure and the ascending aorta was not well visualized. Venous: The pulmonary veins were not well visualized. The inferior vena cava is normal in size with less than 50% respiratory variability, suggesting right atrial pressure of 8 mmHg. IAS/Shunts: The atrial septum is grossly normal.  LEFT VENTRICLE PLAX 2D LVIDd:         4.60 cm  Diastology LVIDs:         2.89 cm  LV e' medial:    7.62 cm/s LV PW:         1.21 cm  LV E/e' medial:  13.1 LV IVS:        0.76 cm  LV e' lateral:   10.00 cm/s LVOT diam:     2.00 cm  LV E/e' lateral: 10.0 LV SV:         67 LV SV Index:   43 LVOT Area:     3.14 cm  RIGHT VENTRICLE             IVC RV Basal diam:  1.96 cm     IVC diam: 1.51 cm RV S prime:     12.10 cm/s TAPSE (M-mode): 2.1 cm LEFT ATRIUM             Index       RIGHT ATRIUM           Index LA diam:        3.20 cm 2.06 cm/m  RA Area:     12.60 cm LA Vol (A2C):   35.1 ml 22.58 ml/m RA Volume:   31.20 ml  20.07 ml/m LA Vol (A4C):   31.3 ml 20.14  ml/m LA Biplane Vol: 33.4 ml 21.49 ml/m  AORTIC VALVE LVOT Vmax:   94.80 cm/s LVOT Vmean:  72.600 cm/s LVOT VTI:    0.214 m  AORTA Ao Root diam: 3.30 cm Ao Asc diam:  3.00 cm MITRAL VALVE MV Area (PHT): 3.65 cm     SHUNTS MV Decel Time: 208 msec     Systemic VTI:  0.21 m MV E velocity: 100.00 cm/s  Systemic Diam: 2.00 cm MV A velocity: 83.40 cm/s MV E/A ratio:  1.20 Rudean Haskell MD Electronically signed by Rudean Haskell MD Signature Date/Time: 02/29/2020/3:28:11 PM    Final    Medications: . sodium chloride 10 mL/hr at 02/29/20 2000  . ceFEPime (MAXIPIME) IV Stopped (02/29/20 1631)   . bictegravir-emtricitabine-tenofovir AF  1 tablet Oral Daily  . Chlorhexidine Gluconate Cloth  6 each Topical Q0600  . cholestyramine light  4 g Oral TID  . heparin  5,000 Units Subcutaneous Q8H  . insulin aspart  1-3 Units Subcutaneous TID WC & HS  . midodrine  10 mg Oral TID WC    Dialysis Orders: MWF at [facility] -> last HD 10/18 4hr, 400/A1.5, EDW 48kg, 2K/2Ca, AVF, heparin 2000 bolus - Hectoral 43mcg IV q HD - Mircera  23mcg IV q 2 weeks (last 10/15)  Assessment/Plan: 1.  Sepsis: Unclear source - possibly viral. No pneumonia. No obvious skin wounds. Hx MRSA bacteremia in 11/2019. BCx NGTD. Initially given Vanc/Cefepime/Flagyl -> now Cefepime only. Has been given ~4L fluids so far -- pls do not give any more fluids d/t ESRD status. + diarrhea, C.diff testing pending. 2.  ESRD: Plan is for HD today - per usual MWF schedule. 3.  Hypotension/volume: Chronically hypotensive, although acutely worse. No excess volume on exam - looks like his dry weight needs to be raised on d/c. Echo with EF 60-65%, grossly normal valves. 4.  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 5.  Metabolic bone disease: CorrCa ok, Phos pending. 6. T2DM 7.  HIV 8. Dispo: Possibly home after HD today, fine from renal standpoint.  Veneta Penton, PA-C 03/01/2020, 10:14 AM  [employer]

## 2020-03-01 NOTE — Progress Notes (Signed)
Pt connected to portable monitor, transferred to dialysis unit accompanied by transport. Tolerated well. No s/s of distress at this time.

## 2020-03-01 NOTE — Progress Notes (Signed)
NAMEJareb Daniels, MRN:  [id number], DOB:  1970-12-26, LOS: 2 ADMISSION DATE:  02/28/2020, CONSULTATION DATE:  02/28/2020 REFERRING MD:  Gareth Morgan MD CHIEF COMPLAINT:  Sore throat  Brief History   49 yo male with PMH significant for HIV, DM2, ESRD with HD MWF, BLE amputation admitted with hypotension.  History of present illness   History obtained using interpreter.  Patient hospitalized in July for MRSA bacteremia, ECHO at that time without vegetation, and discharged on 7/24 with 6 weeks of IV Vancomycin.    Patient presented to ED on 10/17 with complaints of sore throat, cough, and chest pain x 4 days.  He received Amoxicillin and discharged home.  Today, he went to HD where he developed chills and generalized feeling unwell. EMS was called and patient was noted to be tachycardic and febrile at 102. He was transported to [facility] ED.  He received 500 ml NS bolus by EMS.  Patient was noted to be persistently hypotensive in the ED and [facility] consulted to evaluate for admission. On arrival to evaluate patient in the ED, he is alert and oriented, no focal deficits on exam, his only complaints are sore throat and cough.  He denies any nausea/vomiting/abdominal pain. No evidence of cellulitis to bilateral stumps, no recent rash.  Patient was unable to state his dry weight or how much fluid removed at HD today.  Labs notable for mildly elevated wbc: 12, hgb 9.5, plt 197, BG 167.  Lactate 1.    Past Medical History  HIV DM, Type II ESRD on HD MWF LUE access BLE amputations  Significant Hospital Events   10/18: Admit to [facility]  Consults:  10/18: PCCM  Procedures:    Significant Diagnostic Tests:  CTA Chest 10/18: No evidence of pulmonary embolism, small amount of atelectasis to lower lobes.  EKG 10/18: No ST elevation Echo 10/19 > LVEF 60-65%, otherwise non-acute.   Micro Data:  Group A Strep 02/27/2020: Not detected COVID/Influenza 02/27/2020: Negative Blood Cx: 10/18:  >>>  Antimicrobials:  Vanc 10/18 >> Cefepime 10/18 >>  Interim history/subjective:  Feeling much better today. Still complains of throat and bilateral cheek/tooth pain.   Objective   Blood pressure 102/66, pulse 88, temperature 98.1 F (36.7 C), temperature source Oral, resp. rate 19, height 5' (1.524 m), weight 52.4 kg, SpO2 98 %.        Intake/Output Summary (Last 24 hours) at 03/01/2020 0914 Last data filed at 02/29/2020 2000 Gross per 24 hour  Intake 173.96 ml  Output --  Net 173.96 ml   Filed Weights   02/28/20 1602 02/29/20 1418  Weight: 59 kg 52.4 kg   Physical Exam: General: Middle aged male resting comfortably in bed.  HENT: [place], AT, OP clear, MMM. Bilateral cheeks tender.  Respiratory: No distress, clear bilateral Cardiovascular: RRR, no MRG GI: Soft, non-tender, non-distended Extremities: Bilateral LE amputation.  Neuro: AAO x4, CNII-XII grossly intact  Resolved Hospital Problem list     Assessment & Plan:  Hypotension -  Suspect multifactorial with missed midodrine doses today, HD and possible infection. Complains of throat/cheek/tooth pain. Strep negative.  -- continue home Midodrine: 10 mg TID -- Blood Cx pending, C. Diff pending. -- OK to continue cefepime for now, low threshold to narrow after 48 hours total. -- Vancomycin off. -- Echocardiogram ordered. -- Goal MAP > 55.  -- Will see how he does after HD today.   DM, type II -- SSI   HIV --Continue home Biktarvy  ESRD --  Nephrology following  Best practice:  Diet: As tolerated Pain/Anxiety/Delirium protocol (if indicated): n/a VAP protocol (if indicated): n/a DVT prophylaxis: heparin subq GI prophylaxis: n/a Glucose control: SSI Mobility: Bedrest Code Status: Discussed with patient, FULL CODE.  Disposition: Transfer out to floor. Will ask TRH to assume care.  Labs   CBC: Recent Labs  Lab 02/27/20 1203 02/28/20 1722 02/28/20 2330 02/29/20 0443  WBC 12.0* 12.6* 10.5 12.1*   NEUTROABS  --  11.7*  --   --   HGB 10.2* 9.5* 8.7* 9.6*  HCT 29.2* 28.8* 26.5* 29.6*  MCV 98.0 101.1* 104.3* 105.3*  PLT 160 197 189 161    Basic Metabolic Panel: Recent Labs  Lab 02/27/20 1203 02/28/20 1722 02/28/20 2330 02/29/20 0443  NA 136 137  --  138  K 4.5 3.7  --  4.1  CL 94* 91*  --  100  CO2 25 26  --  18*  GLUCOSE 145* 167*  --  191*  BUN 37* 14  --  25*  CREATININE 9.54* 5.52* 5.81* 6.44*  CALCIUM 9.4 8.3*  --  8.3*   GFR: Estimated Creatinine Clearance: 9.8 mL/min (A) (by C-G formula based on SCr of 6.44 mg/dL (H)). Recent Labs  Lab 02/27/20 1203 02/28/20 1722 02/28/20 2127 02/28/20 2330 02/29/20 0443  PROCALCITON  --   --   --  1.41  --   WBC 12.0* 12.6*  --  10.5 12.1*  LATICACIDVEN  --  1.0 1.0  --   --     Liver Function Tests: Recent Labs  Lab 02/28/20 1722  AST 15  ALT 9  ALKPHOS 72  BILITOT 1.5*  PROT 7.8  ALBUMIN 2.9*   No results for input(s): LIPASE, AMYLASE in the last 168 hours. No results for input(s): AMMONIA in the last 168 hours.  ABG    Component Value Date/Time   PHART 7.460 (H) 03/03/2016 1439   PCO2ART 36.9 03/03/2016 1439   PO2ART 34.0 (LL) 03/03/2016 1439   HCO3 26.2 03/03/2016 1439   TCO2 30 05/28/2016 1414   TCO2 30 05/28/2016 1414   O2SAT 69.0 03/03/2016 1439     Coagulation Profile: Recent Labs  Lab 02/28/20 1722  INR 1.2    Cardiac Enzymes: No results for input(s): CKTOTAL, CKMB, CKMBINDEX, TROPONINI in the last 168 hours.  HbA1C: HbA1c, POC (controlled diabetic range)  Date/Time Value Ref Range Status  09/28/2018 02:21 PM 6.8 0.0 - 7.0 % Final   Hgb A1c MFr Bld  Date/Time Value Ref Range Status  12/02/2019 03:43 AM 6.9 (H) 4.8 - 5.6 % Final    Comment:    (NOTE) Pre diabetes:          5.7%-6.4%  Diabetes:              >6.4%  Glycemic control for   <7.0% adults with diabetes   06/18/2018 06:47 AM 9.0 (H) 4.8 - 5.6 % Final    Comment:    (NOTE) Pre diabetes:           5.7%-6.4% Diabetes:              >6.4% Glycemic control for   <7.0% adults with diabetes     CBG: Recent Labs  Lab 02/29/20 0801 02/29/20 1210 02/29/20 1708 02/29/20 2103 03/01/20 0807  GLUCAP 173* 167* 177* 144* 147*     Alvin Daniels, AGACNP-BC [facility]  See Amion for personal pager PCCM on call pager [phone]  03/01/2020 9:31 AM

## 2020-03-02 LAB — GLUCOSE, CAPILLARY: Glucose-Capillary: 133 mg/dL — ABNORMAL HIGH (ref 70–99)

## 2020-03-02 NOTE — Plan of Care (Signed)
  Problem: Clinical Measurements: Goal: Diagnostic test results will improve Outcome: Completed/Met

## 2020-03-02 NOTE — Plan of Care (Signed)
  Problem: Clinical Measurements: Goal: Ability to maintain clinical measurements within normal limits will improve Outcome: Adequate for Discharge Goal: Will remain free from infection Outcome: Adequate for Discharge Goal: Respiratory complications will improve Outcome: Adequate for Discharge Goal: Cardiovascular complication will be avoided Outcome: Adequate for Discharge   

## 2020-03-02 NOTE — Progress Notes (Signed)
New Admission Note:  Arrival Method: Bed/transfer from 4N ICU Mental Orientation: Alert and oriented x 4. Spanish speaking Telemetry: Box 16 NSR Assessment: Completed Skin: Warm and dry. Bilateral BKA with prosthesis IV: NSL Pain: Denies Tubes: N/A Safety Measures: Safety Fall Prevention Plan initiated.  Admission: Completed 5 M  Orientation: Patient has been orientated to the room, unit and the staff. Welcome booklet given.  Family: None  Orders have been reviewed and implemented. Will continue to monitor the patient. Call light has been placed within reach and bed alarm has been activated.   Sima Matas BSN, RN  Phone Number: [phone]

## 2020-03-02 NOTE — Progress Notes (Signed)
Patient refused 1200pm blood sugar check, patient was educated. RN will continue to monitor this patient

## 2020-03-02 NOTE — Progress Notes (Signed)
[facility] Progress Note   Subjective:  Seen in room. Throat pain and diarrhea improving. For discharge today.   Objective Vitals:   03/01/20 1931 03/01/20 2029 03/02/20 0532 03/02/20 0758  BP: 123/67 117/60 (!) 101/55 (!) 90/58  Pulse: 84 88 80 93  Resp: 16 18 18 16   Temp: 98.5 F (36.9 C) 98.2 F (36.8 C) 98.4 F (36.9 C) 98.6 F (37 C)  TempSrc: Oral Oral  Oral  SpO2: 100% 100% 99% 100%  Weight:      Height:       Physical Exam General: Well appearing man, NAD. Room air. Heart: RRR; no murmur Lungs: Reduced air movement in B bases, CTA upper lobes Abdomen: soft, non-tender Extremities: B BKA, no stump edema Dialysis Access: LUE AVF + bruit  Additional Objective Labs: Basic Metabolic Panel: Recent Labs  Lab 02/28/20 1722 02/28/20 1722 02/28/20 2330 02/29/20 0443 03/01/20 1644  NA 137  --   --  138 135  K 3.7  --   --  4.1 3.9  CL 91*  --   --  100 99  CO2 26  --   --  18* 16*  GLUCOSE 167*  --   --  191* 107*  BUN 14  --   --  25* 45*  CREATININE 5.52*   < > 5.81* 6.44* 10.27*  CALCIUM 8.3*  --   --  8.3* 9.1  PHOS  --   --   --   --  6.7*   < > = values in this interval not displayed.   Liver Function Tests: Recent Labs  Lab 02/28/20 1722 03/01/20 1644  AST 15  --   ALT 9  --   ALKPHOS 72  --   BILITOT 1.5*  --   PROT 7.8  --   ALBUMIN 2.9* 2.3*   CBC: Recent Labs  Lab 02/27/20 1203 02/27/20 1203 02/28/20 1722 02/28/20 1722 02/28/20 2330 02/29/20 0443 03/01/20 1644  WBC 12.0*   < > 12.6*   < > 10.5 12.1* 11.1*  NEUTROABS  --   --  11.7*  --   --   --   --   HGB 10.2*   < > 9.5*   < > 8.7* 9.6* 9.2*  HCT 29.2*   < > 28.8*   < > 26.5* 29.6* 27.4*  MCV 98.0  --  101.1*  --  104.3* 105.3* 100.0  PLT 160   < > 197   < > 189 198 226   < > = values in this interval not displayed.   CBG: Recent Labs  Lab 03/01/20 0807 03/01/20 1234 03/01/20 1557 03/01/20 2030 03/02/20 0653  GLUCAP 147* 138* 111* 90 133*    Studies/Results: ECHOCARDIOGRAM COMPLETE  Result Date: 02/29/2020    ECHOCARDIOGRAM REPORT   Patient Name:   Alvin Daniels Date of Exam: 02/29/2020 Medical Rec #:  [id number]              Height:       60.0 in Accession #:    [id number]             Weight:       130.0 lb Date of Birth:  June 17, 1970               BSA:          1.554 m Patient Age:    49 years  BP:           93/56 mmHg Patient Gender: M                      HR:           93 bpm. Exam Location:  Inpatient Procedure: 2D Echo, Cardiac Doppler and Color Doppler Indications:    Hypotension [id number]  History:        Patient has prior history of Echocardiogram examinations. Risk                 Factors:Hypertension and Diabetes. ESRD.  Sonographer:    Jonelle Sidle Dance Referring Phys: [id number] CHI JANE ELLISON IMPRESSIONS  1. Left ventricular ejection fraction, by estimation, is 60 to 65%. The left ventricle has normal function. The left ventricle has no regional wall motion abnormalities. Left ventricular diastolic parameters were normal.  2. Right ventricular systolic function is normal. The right ventricular size is normal.  3. The mitral valve is grossly normal. No evidence of mitral valve regurgitation.  4. The aortic valve is grossly normal. Aortic valve regurgitation is not visualized.  5. The inferior vena cava is normal in size with <50% respiratory variability, suggesting right atrial pressure of 8 mmHg. Comparison(s): A prior study was performed on 12/06/19. Compared with prior, LV is more robust. FINDINGS  Left Ventricle: Left ventricular ejection fraction, by estimation, is 60 to 65%. The left ventricle has normal function. The left ventricle has no regional wall motion abnormalities. The left ventricular internal cavity size was normal in size. There is  no left ventricular hypertrophy. Left ventricular diastolic parameters were normal. Right Ventricle: The right ventricular size is normal. No increase in right ventricular  wall thickness. Right ventricular systolic function is normal. Left Atrium: Left atrial size was normal in size. Right Atrium: Right atrial size was normal in size. Pericardium: There is no evidence of pericardial effusion. Mitral Valve: The mitral valve is grossly normal. No evidence of mitral valve regurgitation. Tricuspid Valve: The tricuspid valve is grossly normal. Tricuspid valve regurgitation is trivial. Aortic Valve: The aortic valve is grossly normal. Aortic valve regurgitation is not visualized. Pulmonic Valve: The pulmonic valve was not well visualized. Pulmonic valve regurgitation is not visualized. Aorta: The aortic root is normal in size and structure and the ascending aorta was not well visualized. Venous: The pulmonary veins were not well visualized. The inferior vena cava is normal in size with less than 50% respiratory variability, suggesting right atrial pressure of 8 mmHg. IAS/Shunts: The atrial septum is grossly normal.  LEFT VENTRICLE PLAX 2D LVIDd:         4.60 cm  Diastology LVIDs:         2.89 cm  LV e' medial:    7.62 cm/s LV PW:         1.21 cm  LV E/e' medial:  13.1 LV IVS:        0.76 cm  LV e' lateral:   10.00 cm/s LVOT diam:     2.00 cm  LV E/e' lateral: 10.0 LV SV:         67 LV SV Index:   43 LVOT Area:     3.14 cm  RIGHT VENTRICLE             IVC RV Basal diam:  1.96 cm     IVC diam: 1.51 cm RV S prime:     12.10 cm/s TAPSE (M-mode): 2.1 cm LEFT ATRIUM  Index       RIGHT ATRIUM           Index LA diam:        3.20 cm 2.06 cm/m  RA Area:     12.60 cm LA Vol (A2C):   35.1 ml 22.58 ml/m RA Volume:   31.20 ml  20.07 ml/m LA Vol (A4C):   31.3 ml 20.14 ml/m LA Biplane Vol: 33.4 ml 21.49 ml/m  AORTIC VALVE LVOT Vmax:   94.80 cm/s LVOT Vmean:  72.600 cm/s LVOT VTI:    0.214 m  AORTA Ao Root diam: 3.30 cm Ao Asc diam:  3.00 cm MITRAL VALVE MV Area (PHT): 3.65 cm     SHUNTS MV Decel Time: 208 msec     Systemic VTI:  0.21 m MV E velocity: 100.00 cm/s  Systemic Diam: 2.00 cm  MV A velocity: 83.40 cm/s MV E/A ratio:  1.20 Rudean Haskell MD Electronically signed by Rudean Haskell MD Signature Date/Time: 02/29/2020/3:28:11 PM    Final    Medications: . sodium chloride 10 mL/hr at 02/29/20 2000   . bictegravir-emtricitabine-tenofovir AF  1 tablet Oral Daily  . Chlorhexidine Gluconate Cloth  6 each Topical Q0600  . cholestyramine light  4 g Oral TID  . heparin  5,000 Units Subcutaneous Q8H  . insulin aspart  1-3 Units Subcutaneous TID WC & HS  . midodrine  10 mg Oral TID WC    Dialysis Orders: MWF at [facility] -> last HD 10/18 4hr, 400/A1.5, EDW 48kg, 2K/2Ca, AVF, heparin 2000 bolus - Hectoral 61mcg IV q HD - Mircera 66mcg IV q 2 weeks (last 10/15)  Assessment/Plan: 1. Sepsis:Unclear source - possibly viral. No pneumonia. No obvious skin wounds. Hx MRSA bacteremia in 11/2019. BCx NGTD. Initially given Vanc/Cefepime/Flagyl -> now Cefepime only. Has been given ~4L fluids so far -- pls do not give any more fluids d/t ESRD status. + diarrhea, C.diff testing pending. 2. ESRD:Continue HD per MWF schedule. 3. Hypotension/volume:Chronically hypotensive, although acutely worse. No excess volume on exam - looks like his dry weight needs to be raised on d/c. Echo with EF 60-65%, grossly normal valves. 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 5. Metabolic bone disease:CorrCa ok, Phos high. 6. T2DM 7. HIV 8. Dispo: D/c home today.  Veneta Penton, PA-C 03/02/2020, 11:14 AM  [employer]

## 2020-03-02 NOTE — Progress Notes (Signed)
Se habl con el paciente/cuidador sobre las instrucciones para el alta (incluyendo medicamentos) y se le proporcion una copia 

## 2020-03-02 NOTE — Discharge Summary (Signed)
Physician Discharge Summary       Patient ID: Alvin Daniels MRN: [id number] DOB/AGE: December 15, 1970 49 y.o.  Admit date: 02/28/2020 Discharge date: 03/02/2020  Discharge Diagnoses:  Active Problems:   Hypotension   Sepsis ([place])   History of Present Illness:  Patient presented to ED on 10/17 with complaints of sore throat, cough, and chest pain x 4 days.  He received Amoxicillin and discharged home.  Today, he went to HD where he developed chills and generalized feeling unwell. EMS was called and patient was noted to be tachycardic and febrile at 102. He was transported to [facility] ED.  He received 500 ml NS bolus by EMS.  Patient was noted to be persistently hypotensive in the ED and [facility] consulted to evaluate for admission. On arrival to evaluate patient in the ED, he is alert and oriented, no focal deficits on exam, his only complaints are sore throat and cough.  He denies any nausea/vomiting/abdominal pain. No evidence of cellulitis to bilateral stumps, no recent rash.  Patient was unable to state his dry weight or how much fluid removed at HD today.  Labs notable for mildly elevated wbc: 12, hgb 9.5, plt 197, BG 167.  Lactate 1.    Hospital Course:   He was hypotensive and was admitted to the ICU. There was concern for sepsis vs hypovolemia s/p hemodialysis in the setting of chronic hypotension. Notably he had missed doses of midodrine. Fortunately he never required vasopressors, sepsis ruled out. Felt to be viral URI.  Antibiotics stopped. He underwent HD 10/20 without difficulty after being restarted on midodrine.    Discharge Plan by active problems   Viral URI with odynophagia: improved - antibiotics discontinued  ESRD: - Resume normal hemodialysis schedule  Hypotension: chronic - continue home midodrine dosing.   DM - no changes to home insulin  HIV - Continue [facility] tests/ studies   CTA Chest 10/18: No evidence of pulmonary embolism,  small amount of atelectasis to lower lobes.  EKG 10/18: No ST elevation Echo 10/19 > LVEF 60-65%, otherwise non-acute.   Group A Strep swab 02/27/2020: Not detected COVID/Influenza 02/27/2020: Negative Blood Cx: 10/18: > No growth at 3 days, final results still pending.  C. Diff negative   Consults  Nephrology  Discharge Exam: BP (!) 90/58 (BP Location: Left Arm)   Pulse 93   Temp 98.6 F (37 C) (Oral)   Resp 16   Ht 5' (1.524 m)   Wt 52.4 kg   SpO2 100%   BMI 22.56 kg/m   General:  Middle aged male in NAD Neuro:  Alert, oriented, non-focal HEENT:  [place]/AT, No JVD noted, PERRL Cardiovascular:  RRR, no MRG Lungs:  Clear Abdomen:  Soft, non-distended Musculoskeletal:  Bilateral LE amputations, remote.  Skin:  Intact, MMM  Labs at discharge Lab Results  Component Value Date   CREATININE 10.27 (H) 03/01/2020   BUN 45 (H) 03/01/2020   NA 135 03/01/2020   K 3.9 03/01/2020   CL 99 03/01/2020   CO2 16 (L) 03/01/2020   Lab Results  Component Value Date   WBC 11.1 (H) 03/01/2020   HGB 9.2 (L) 03/01/2020   HCT 27.4 (L) 03/01/2020   MCV 100.0 03/01/2020   PLT 226 03/01/2020   Lab Results  Component Value Date   ALT 9 02/28/2020   AST 15 02/28/2020   ALKPHOS 72 02/28/2020   BILITOT 1.5 (H) 02/28/2020   Lab Results  Component Value Date  INR 1.2 02/28/2020   INR 1.1 09/27/2019   INR 1.27 08/16/2016    Current radiology studies ECHOCARDIOGRAM COMPLETE  Result Date: 02/29/2020    ECHOCARDIOGRAM REPORT   Patient Name:   Alvin Daniels Date of Exam: 02/29/2020 Medical Rec #:  [id number]              Height:       60.0 in Accession #:    [id number]             Weight:       130.0 lb Date of Birth:  1970/10/04               BSA:          1.554 m Patient Age:    82 years               BP:           93/56 mmHg Patient Gender: M                      HR:           93 bpm. Exam Location:  Inpatient Procedure: 2D Echo, Cardiac Doppler and Color Doppler Indications:     Hypotension [id number]  History:        Patient has prior history of Echocardiogram examinations. Risk                 Factors:Hypertension and Diabetes. ESRD.  Sonographer:    Jonelle Sidle Dance Referring Phys: [id number] CHI JANE ELLISON IMPRESSIONS  1. Left ventricular ejection fraction, by estimation, is 60 to 65%. The left ventricle has normal function. The left ventricle has no regional wall motion abnormalities. Left ventricular diastolic parameters were normal.  2. Right ventricular systolic function is normal. The right ventricular size is normal.  3. The mitral valve is grossly normal. No evidence of mitral valve regurgitation.  4. The aortic valve is grossly normal. Aortic valve regurgitation is not visualized.  5. The inferior vena cava is normal in size with <50% respiratory variability, suggesting right atrial pressure of 8 mmHg. Comparison(s): A prior study was performed on 12/06/19. Compared with prior, LV is more robust. FINDINGS  Left Ventricle: Left ventricular ejection fraction, by estimation, is 60 to 65%. The left ventricle has normal function. The left ventricle has no regional wall motion abnormalities. The left ventricular internal cavity size was normal in size. There is  no left ventricular hypertrophy. Left ventricular diastolic parameters were normal. Right Ventricle: The right ventricular size is normal. No increase in right ventricular wall thickness. Right ventricular systolic function is normal. Left Atrium: Left atrial size was normal in size. Right Atrium: Right atrial size was normal in size. Pericardium: There is no evidence of pericardial effusion. Mitral Valve: The mitral valve is grossly normal. No evidence of mitral valve regurgitation. Tricuspid Valve: The tricuspid valve is grossly normal. Tricuspid valve regurgitation is trivial. Aortic Valve: The aortic valve is grossly normal. Aortic valve regurgitation is not visualized. Pulmonic Valve: The pulmonic valve was not well visualized.  Pulmonic valve regurgitation is not visualized. Aorta: The aortic root is normal in size and structure and the ascending aorta was not well visualized. Venous: The pulmonary veins were not well visualized. The inferior vena cava is normal in size with less than 50% respiratory variability, suggesting right atrial pressure of 8 mmHg. IAS/Shunts: The atrial septum is grossly normal.  LEFT VENTRICLE PLAX 2D LVIDd:  4.60 cm  Diastology LVIDs:         2.89 cm  LV e' medial:    7.62 cm/s LV PW:         1.21 cm  LV E/e' medial:  13.1 LV IVS:        0.76 cm  LV e' lateral:   10.00 cm/s LVOT diam:     2.00 cm  LV E/e' lateral: 10.0 LV SV:         67 LV SV Index:   43 LVOT Area:     3.14 cm  RIGHT VENTRICLE             IVC RV Basal diam:  1.96 cm     IVC diam: 1.51 cm RV S prime:     12.10 cm/s TAPSE (M-mode): 2.1 cm LEFT ATRIUM             Index       RIGHT ATRIUM           Index LA diam:        3.20 cm 2.06 cm/m  RA Area:     12.60 cm LA Vol (A2C):   35.1 ml 22.58 ml/m RA Volume:   31.20 ml  20.07 ml/m LA Vol (A4C):   31.3 ml 20.14 ml/m LA Biplane Vol: 33.4 ml 21.49 ml/m  AORTIC VALVE LVOT Vmax:   94.80 cm/s LVOT Vmean:  72.600 cm/s LVOT VTI:    0.214 m  AORTA Ao Root diam: 3.30 cm Ao Asc diam:  3.00 cm MITRAL VALVE MV Area (PHT): 3.65 cm     SHUNTS MV Decel Time: 208 msec     Systemic VTI:  0.21 m MV E velocity: 100.00 cm/s  Systemic Diam: 2.00 cm MV A velocity: 83.40 cm/s MV E/A ratio:  1.20 Rudean Haskell MD Electronically signed by Rudean Haskell MD Signature Date/Time: 02/29/2020/3:28:11 PM    Final     Disposition:  Home   Allergies as of 03/02/2020   No Known Allergies     Medication List    TAKE these medications   Biktarvy 50-200-25 MG Tabs tablet Generic drug: bictegravir-emtricitabine-tenofovir AF Take 1 tablet by mouth daily.   glucose blood test strip Commonly known as: True Metrix Blood Glucose Test 1 each by Other route 3 (three) times daily.   HumuLIN 70/30  (70-30) 100 UNIT/ML injection Generic drug: insulin NPH-regular Human Inject 15 Units into the skin 2 (two) times daily before a meal. What changed: how much to take   Insulin Syringe-Needle U-100 31G X 15/64" 0.5 ML Misc Commonly known as: BD Insulin Syringe Ultrafine 1 each by Does not apply route 2 (two) times daily.   midodrine 10 MG tablet Commonly known as: PROAMATINE Take 1 tablet (10 mg total) by mouth 3 (three) times daily with meals.   True Metrix Meter Devi 1 each by Does not apply route 3 (three) times daily.   TRUEplus Lancets 28G Misc 1 each by Does not apply route 3 (three) times daily.        Discharged Condition: good  38 minutes of time have been dedicated to discharge assessment, planning and discharge instructions.   Signed:  Georgann Housekeeper, AGACNP-BC [place]  See Amion for personal pager PCCM on call pager [phone]  03/02/2020 11:02 AM

## 2020-03-04 LAB — CULTURE, BLOOD (ROUTINE X 2)
Culture: NO GROWTH
Culture: NO GROWTH

## 2020-03-30 ENCOUNTER — Other Ambulatory Visit (HOSPITAL_COMMUNITY): Payer: Self-pay | Admitting: Nephrology

## 2020-03-30 DIAGNOSIS — G4452 New daily persistent headache (NDPH): Secondary | ICD-10-CM

## 2020-04-10 ENCOUNTER — Other Ambulatory Visit: Payer: Self-pay | Admitting: Family Medicine

## 2020-04-10 NOTE — Telephone Encounter (Signed)
Interpreter called stating that the pt is completely out of this medication. Please advise.

## 2020-04-10 NOTE — Telephone Encounter (Signed)
Requested medication (s) are due for refill today: yes   Requested medication (s) are on the active medication list: yes   Last refill: 02/22/2020  Future visit scheduled: no  Notes to clinic:  overdue for follow up appt VM left for patient    Requested Prescriptions  Pending Prescriptions Disp Refills   NOVOLIN 70/30 (70-30) 100 UNIT/ML injection [Pharmacy Med Name: NOVOLIN 70/30 100 UNITS/ML (70-30) 100 SUS] 10 mL 0    Sig: INJECT 15 UNITS INTO THE SKIN 2 (TWO) TIMES DAILY BEFORE A MEAL.      Endocrinology:  Diabetes - Insulins Failed - 04/10/2020  1:25 PM      Failed - Valid encounter within last 6 months    Recent Outpatient Visits           6 months ago Hx MRSA infection   [place], Jeneen Rinks, MD   1 year ago Diabetes mellitus with ESRD (end-stage renal disease) ([place])   [place], Charlane Ferretti, MD   1 year ago Diabetes mellitus with ESRD (end-stage renal disease) ([place])   [place], [place]   3 years ago Diabetes mellitus with ESRD (end-stage renal disease) ([place])   [place], Charlane Ferretti, MD   4 years ago Diabetes mellitus with ESRD (end-stage renal disease) ([place])   [place], Charlane Ferretti, MD              Passed - HBA1C is between 0 and 7.9 and within 180 days    HbA1c, POC (controlled diabetic range)  Date Value Ref Range Status  09/28/2018 6.8 0.0 - 7.0 % Final   Hgb A1c MFr Bld  Date Value Ref Range Status  12/02/2019 6.9 (H) 4.8 - 5.6 % Final    Comment:    (NOTE) Pre diabetes:          5.7%-6.4%  Diabetes:              >6.4%  Glycemic control for   <7.0% adults with diabetes

## 2020-04-13 ENCOUNTER — Ambulatory Visit (HOSPITAL_COMMUNITY): Payer: Self-pay

## 2020-04-13 ENCOUNTER — Encounter (HOSPITAL_COMMUNITY): Payer: Self-pay

## 2020-05-02 ENCOUNTER — Ambulatory Visit: Payer: Self-pay | Admitting: Internal Medicine

## 2020-05-24 DIAGNOSIS — Z20822 Contact with and (suspected) exposure to covid-19: Secondary | ICD-10-CM | POA: Insufficient documentation

## 2020-05-25 ENCOUNTER — Emergency Department (HOSPITAL_COMMUNITY)
Admission: EM | Admit: 2020-05-25 | Discharge: 2020-05-25 | Disposition: A | Discharge status: Home / Self Care | Payer: HRSA Program | Attending: Emergency Medicine | Admitting: Emergency Medicine

## 2020-05-25 ENCOUNTER — Encounter (HOSPITAL_COMMUNITY): Payer: Self-pay | Admitting: Emergency Medicine

## 2020-05-25 DIAGNOSIS — E1122 Type 2 diabetes mellitus with diabetic chronic kidney disease: Secondary | ICD-10-CM | POA: Diagnosis not present

## 2020-05-25 DIAGNOSIS — L739 Follicular disorder, unspecified: Secondary | ICD-10-CM | POA: Insufficient documentation

## 2020-05-25 DIAGNOSIS — N186 End stage renal disease: Secondary | ICD-10-CM | POA: Insufficient documentation

## 2020-05-25 DIAGNOSIS — Z992 Dependence on renal dialysis: Secondary | ICD-10-CM | POA: Insufficient documentation

## 2020-05-25 DIAGNOSIS — J340 Abscess, furuncle and carbuncle of nose: Secondary | ICD-10-CM | POA: Diagnosis present

## 2020-05-25 DIAGNOSIS — U071 COVID-19: Secondary | ICD-10-CM | POA: Insufficient documentation

## 2020-05-25 DIAGNOSIS — Z794 Long term (current) use of insulin: Secondary | ICD-10-CM | POA: Insufficient documentation

## 2020-05-25 MED ORDER — SULFAMETHOXAZOLE-TRIMETHOPRIM 800-160 MG PO TABS: 1.0000 | ORAL_TABLET | Freq: Two times a day (BID) | ORAL | 0 refills | Status: AC

## 2020-05-25 NOTE — ED Triage Notes (Addendum)
Per EMS, patient from grocery store, c/o back pain and nose pain. States sore on nose x1 week. Dialysis M/W/F. Ambulatory with walker.   States Covid+ yesterday at dialysis.

## 2020-05-25 NOTE — ED Provider Notes (Signed)
Alvin Daniels DEPT Provider Note   CSN: [id number] Arrival date & time: 05/25/20  1124     History Chief Complaint  Patient presents with   Back Pain   Covid Positive    Alvin Daniels is a 50 y.o. male.  50 year old male with prior medical history as detailed below presents for evaluation.  Patient arrives by EMS.  Patient is primarily Spanish-speaking.  Language interpretation was utilized for my evaluation.  Hecomplains of a sore to the end of his nose.  This sore has been there for the last 2 weeks.  He is applied topical antibiotics without significant improvement.  Patient denies other complaint.  He does request COVID swab.  He reports that his dialysis nurses yesterday advised him to get tested for COVID.  He is otherwise denying shortness of breath, cough, fever, or other complaint.    The history is provided by the patient and medical records.  Illness Location:  Sore on tip of nose Severity:  Mild Onset quality:  Gradual Duration:  2 weeks Timing:  Constant Progression:  Worsening Chronicity:  New Associated symptoms: no chest pain, no fever, no headaches and no shortness of breath        Past Medical History:  Diagnosis Date   AIDS ([place]) 11/22/2014   Back pain 06/09/2019   BKA stump complication ([name]) 09/15/3974   Chronic diarrhea    Chronic hepatitis C without hepatic coma ([place]) 11/22/2014   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[place])    Right   Diabetic neuropathy ([employer])    ESRD (end stage renal disease) on dialysis ([place])    "TTS; don't remember street name" (05/03/2014)   Hepatitis C    HIV INFECTION 06/27/2010   Qualifier: Diagnosis of  By: Nickola Major CMA ( AAMA), Jacqueline     Hypotension 7/34/1937   Metabolic bone disease 9/0/2409   MRSA infection    Normocytic anemia 06/17/2012   Pancreatitis    Pressure ulcer of BKA stump, stage 2 ([place]) 11/22/2014   Renal disorder    Severe protein-calorie  malnutrition ([place]) 06/17/2012   Uncontrolled diabetes mellitus with complications ([place]) 7/35/3299   Annotation: uncontrolled Qualifier: Diagnosis of  By: Nickola Major CMA Deborra Medina), Jacqueline      Patient Active Problem List   Diagnosis Date Noted   Sepsis ([place]) 02/28/2020   Amputation stump infection ([place]) 12/02/2019   Scapular fracture 09/28/2019   COVID-19 virus infection 09/28/2019   Chest pain 09/27/2019   Hx MRSA infection 09/21/2019   BKA stump complication ([place]) 24/26/8341   Back pain 06/09/2019   Abscess of foot    Abscess of ankle 08/29/2018   Charcot foot due to diabetes mellitus ([place]) 07/23/2018   MRSA bacteremia    Bacteremia 06/18/2018   Diabetic foot ulcer ([place]) 11/19/2017   Femur fracture, left ([place]) 08/16/2016   HIV (human immunodeficiency virus infection) ([place]) 08/16/2016   GERD (gastroesophageal reflux disease) 05/24/2016   Burn 04/10/2016   Pressure ulcer of BKA stump, stage 2 ([place]) 11/22/2014   Chronic hepatitis C without hepatic coma ([place]) 11/22/2014   S/P bilateral BKA (below knee amputation) ([place]) 06/17/2014   HIV disease ([place])    Poor dentition 10/25/2013   ESRD on hemodialysis ([place]) 10/25/2013   Vision changes 06/21/2013   Orthostatic headache 04/15/2013   Anemia of chronic renal failure 96/22/2979   Metabolic bone disease 89/21/1941   Chronic diarrhea 06/17/2012   Severe protein-calorie malnutrition ([name]) 06/17/2012   Normocytic anemia 06/17/2012   Hypotension 06/02/2012  Cellulitis of right lower extremity 10/19/2010   Polyneuropathy in diabetes(357.2) 06/27/2010   Diabetes mellitus with ESRD (end-stage renal disease) ([place]) 01/03/2010    Past Surgical History:  Procedure Laterality Date   AMPUTATION Left 04/20/2014   Procedure: 3rd toe amputation, 4th Toe Amputation,  5th Toe Amputation;  Surgeon: Newt Minion, MD;  Location: [place];  Service: Orthopedics;  Laterality: Left;   AMPUTATION Left 05/02/2014    Procedure: Midfoot Amputation;  Surgeon: Newt Minion, MD;  Location: [place];  Service: Orthopedics;  Laterality: Left;   AMPUTATION Left 06/17/2014   Procedure: AMPUTATION BELOW KNEE;  Surgeon: Newt Minion, MD;  Location: [place];  Service: Orthopedics;  Laterality: Left;   AMPUTATION Right 09/04/2018   Procedure: RIGHT BELOW KNEE AMPUTATION;  Surgeon: Newt Minion, MD;  Location: [place];  Service: Orthopedics;  Laterality: Right;  RIGHT BELOW KNEE AMPUTATION   AV FISTULA PLACEMENT Left    AV FISTULA PLACEMENT Left 05/10/2016   Procedure: Creation Left Arm Brachiocephalic Arteriovenous Fistula and Ligation of Radiocephalic Fistula;  Surgeon: Angelia Mould, MD;  Location: [place];  Service: Vascular;  Laterality: Left;   FEMUR IM NAIL Left 08/17/2016   Procedure: INTRAMEDULLARY (IM) RETROGRADE FEMORAL NAILING;  Surgeon: Marybelle Killings, MD;  Location: [place];  Service: Orthopedics;  Laterality: Left;   FOOT AMPUTATION THROUGH ANKLE Left 12/'21/2015   midfoot   IR GENERIC HISTORICAL Left 05/01/2016   IR THROMBECTOMY AV FISTULA W/THROMBOLYSIS/PTA INC/SHUNT/IMG LEFT 05/01/2016 [name], MD [facility]   IR GENERIC HISTORICAL  05/01/2016   IR US GUIDE VASC ACCESS LEFT 05/01/2016 [name], MD [facility]   IR GENERIC HISTORICAL  05/07/2016   IR FLUORO GUIDE CV LINE RIGHT 05/07/2016 Corrie Mckusick, DO [facility]   IR GENERIC HISTORICAL  05/07/2016   IR US GUIDE VASC ACCESS RIGHT 05/07/2016 Corrie Mckusick, DO [facility]   IR GENERIC HISTORICAL  05/22/2016   IR US GUIDE VASC ACCESS RIGHT 05/22/2016 Greggory Keen, MD [facility]   IR GENERIC HISTORICAL  05/22/2016   IR FLUORO GUIDE CV LINE RIGHT 05/22/2016 Greggory Keen, MD [facility]   IR REMOVAL TUN CV CATH W/O [facility]  08/21/2016   PERIPHERAL VASCULAR CATHETERIZATION Left 05/09/2016   Procedure: A/V Fistulagram;  Surgeon: Angelia Mould, MD;  Location: [place] CV [facility];  Service: Cardiovascular;  Laterality:  Left;  arm   TEE WITHOUT CARDIOVERSION N/A 06/22/2018   Procedure: TRANSESOPHAGEAL ECHOCARDIOGRAM (TEE);  Surgeon: Jerline Pain, MD;  Location: [facility];  Service: Cardiovascular;  Laterality: N/A;       Family History  Problem Relation Age of Onset   Diabetes Mother    Diabetes Father     Social History   Tobacco Use   Smoking status: Never Smoker   Smokeless tobacco: Never Used  Vaping Use   Vaping Use: Never used  Substance Use Topics   Alcohol use: No   Drug use: No    Home Medications Prior to Admission medications   Medication Sig Start Date End Date Taking? Authorizing Provider  bictegravir-emtricitabine-tenofovir AF (BIKTARVY) 50-200-25 MG TABS tablet Take 1 tablet by mouth daily. 06/09/19   Truman Hayward, MD  Blood Glucose Monitoring Suppl (TRUE METRIX METER) DEVI 1 each by Does not apply route 3 (three) times daily. 09/15/18   [name], MD  glucose blood (TRUE METRIX BLOOD GLUCOSE TEST) test strip 1 each by Other route 3 (three) times daily. 09/15/18   Newlin, Charlane Ferretti,  MD  insulin NPH-regular Human (HUMULIN 70/30) (70-30) 100 UNIT/ML injection Inject 15 Units into the skin 2 (two) times daily before a meal. Patient taking differently: Inject 9 Units into the skin 2 (two) times daily before a meal.  02/10/20   [name], MD  Insulin Syringe-Needle U-100 (BD INSULIN SYRINGE ULTRAFINE) 31G X 15/64" 0.5 ML MISC 1 each by Does not apply route 2 (two) times daily. 02/10/20   [name], MD  midodrine (PROAMATINE) 10 MG tablet Take 1 tablet (10 mg total) by mouth 3 (three) times daily with meals. 12/10/19   Regalado, Belkys A, MD  TRUEplus Lancets 28G MISC 1 each by Does not apply route 3 (three) times daily. 09/15/18   [name], MD    Allergies    Patient has no known allergies.  Review of Systems   Review of Systems  Constitutional: Negative for fever.  Respiratory: Negative for shortness of breath.   Cardiovascular: Negative for  chest pain.  Neurological: Negative for headaches.  All other systems reviewed and are negative.   Physical Exam Updated Vital Signs BP 100/81    Pulse 88    Temp 98.4 F (36.9 C) (Oral)    Resp 18    SpO2 100%   Physical Exam Vitals and nursing note reviewed.  Constitutional:      General: He is not in acute distress.    Appearance: He is well-developed and well-nourished.  HENT:     Head: Normocephalic and atraumatic.     Nose:     Comments: Mild erythema to the tip of the nose with minimal purulence produced with compression of the area.    Mouth/Throat:     Mouth: Oropharynx is clear and moist.  Eyes:     Extraocular Movements: EOM normal.     Conjunctiva/sclera: Conjunctivae normal.     Pupils: Pupils are equal, round, and reactive to light.  Cardiovascular:     Rate and Rhythm: Normal rate and regular rhythm.     Heart sounds: Normal heart sounds.  Pulmonary:     Effort: Pulmonary effort is normal. No respiratory distress.     Breath sounds: Normal breath sounds.  Abdominal:     General: There is no distension.     Palpations: Abdomen is soft.     Tenderness: There is no abdominal tenderness.  Musculoskeletal:        General: No deformity or edema. Normal range of motion.     Cervical back: Normal range of motion and neck supple.  Skin:    General: Skin is warm and dry.  Neurological:     Mental Status: He is alert and oriented to person, place, and time.  Psychiatric:        Mood and Affect: Mood and affect normal.     ED Results / Procedures / Treatments   Labs (all labs ordered are listed, but only abnormal results are displayed) Labs Reviewed  SARS CORONAVIRUS 2 (TAT 6-24 HRS)    EKG None  Radiology No results found.  Procedures Procedures (including critical care time)  Medications Ordered in ED Medications - No data to display  ED Course  I have reviewed the triage vital signs and the nursing notes.  Pertinent labs & imaging results  that were available during my care of the patient were reviewed by me and considered in my medical decision making (see chart for details).    MDM Rules/Calculators/A&P  MDM  Screen complete  Alvin Daniels was evaluated in Emergency Department on 05/25/2020 for the symptoms described in the history of present illness. He was evaluated in the context of the global COVID-19 pandemic, which necessitated consideration that the patient might be at risk for infection with the SARS-CoV-2 virus that causes COVID-19. Institutional protocols and algorithms that pertain to the evaluation of patients at risk for COVID-19 are in a state of rapid change based on information released by regulatory bodies including the CDC and federal and state organizations. These policies and algorithms were followed during the patient's care in the ED.  Patient is presenting primarily for evaluation of nasal sore.  His exam is suggestive of small abscess at the tip of the nose.  Small amount of purulence was produced with compression of the area.  No indication for I&D.  Patient is requesting COVID screening.  He is without symptoms suggestive of active COVID infection.  Patient is agreeable to outpatient course of antibiotics for the infection on his nose.  Patient has dialysis scheduled for tomorrow.  He understands need for close follow-up.  Strict return precautions given and understood.  Final Clinical Impression(s) / ED Diagnoses Final diagnoses:  Folliculitis    Rx / DC Orders ED Discharge Orders         Ordered    sulfamethoxazole-trimethoprim (BACTRIM DS) 800-160 MG tablet  2 times daily        05/25/20 1726           Valarie Merino, MD 05/25/20 1727

## 2020-05-25 NOTE — ED Notes (Addendum)
[facility] interpreter used to review discharge paperwork with pt: Alvin Daniels [id number]-  Pt with questions regarding his dialysis schedule, to which this RN explained that pt will have to contact dialysis center directly to adjust scheduled dialysis times. Pt with no further questions.  Ambulatory at time of discharge.

## 2020-05-25 NOTE — Discharge Instructions (Addendum)
Please return for any problem.   You Covid test results will be available through [facility] within 24 hours.

## 2020-05-25 NOTE — ED Notes (Signed)
Pt ambulatory from triage with walker.

## 2020-05-26 LAB — SARS CORONAVIRUS 2 (TAT 6-24 HRS): SARS Coronavirus 2: POSITIVE — AB

## 2020-06-28 ENCOUNTER — Ambulatory Visit (HOSPITAL_COMMUNITY)
Admission: RE | Admit: 2020-06-28 | Discharge: 2020-06-28 | Disposition: A | Discharge status: Home / Self Care | Payer: Self-pay | Source: Ambulatory Visit | Attending: Nephrology | Admitting: Nephrology

## 2020-06-28 ENCOUNTER — Other Ambulatory Visit: Payer: Self-pay

## 2020-06-28 ENCOUNTER — Encounter (HOSPITAL_COMMUNITY): Payer: Self-pay

## 2020-06-28 DIAGNOSIS — G4452 New daily persistent headache (NDPH): Secondary | ICD-10-CM | POA: Insufficient documentation

## 2020-06-28 MED ORDER — IOHEXOL 300 MG/ML  SOLN
75.0000 mL | Freq: Once | INTRAMUSCULAR | Status: AC | PRN
  Administered 2020-06-28: 75 mL via INTRAVENOUS

## 2020-06-30 ENCOUNTER — Other Ambulatory Visit: Payer: Self-pay | Admitting: Infectious Disease

## 2020-06-30 DIAGNOSIS — B20 Human immunodeficiency virus [HIV] disease: Secondary | ICD-10-CM

## 2020-07-14 ENCOUNTER — Encounter: Payer: Self-pay | Admitting: Infectious Disease

## 2020-08-09 ENCOUNTER — Ambulatory Visit: Payer: Self-pay

## 2020-08-09 ENCOUNTER — Other Ambulatory Visit: Payer: Self-pay

## 2020-08-09 ENCOUNTER — Encounter: Payer: Self-pay | Admitting: Infectious Disease

## 2020-08-09 ENCOUNTER — Ambulatory Visit (INDEPENDENT_AMBULATORY_CARE_PROVIDER_SITE_OTHER): Payer: Self-pay | Admitting: Infectious Disease

## 2020-08-09 VITALS — BP 111/74 | HR 92 | Temp 97.9°F | Wt 116.0 lb

## 2020-08-09 DIAGNOSIS — E1169 Type 2 diabetes mellitus with other specified complication: Secondary | ICD-10-CM

## 2020-08-09 DIAGNOSIS — T8740 Infection of amputation stump, unspecified extremity: Secondary | ICD-10-CM

## 2020-08-09 DIAGNOSIS — Z992 Dependence on renal dialysis: Secondary | ICD-10-CM

## 2020-08-09 DIAGNOSIS — Z89512 Acquired absence of left leg below knee: Secondary | ICD-10-CM

## 2020-08-09 DIAGNOSIS — N186 End stage renal disease: Secondary | ICD-10-CM

## 2020-08-09 DIAGNOSIS — R1031 Right lower quadrant pain: Secondary | ICD-10-CM

## 2020-08-09 DIAGNOSIS — Z8614 Personal history of Methicillin resistant Staphylococcus aureus infection: Secondary | ICD-10-CM

## 2020-08-09 DIAGNOSIS — E1122 Type 2 diabetes mellitus with diabetic chronic kidney disease: Secondary | ICD-10-CM

## 2020-08-09 DIAGNOSIS — B20 Human immunodeficiency virus [HIV] disease: Secondary | ICD-10-CM

## 2020-08-09 DIAGNOSIS — Z89511 Acquired absence of right leg below knee: Secondary | ICD-10-CM

## 2020-08-09 NOTE — Progress Notes (Signed)
Subjective:  Chief complaint: followup for HIV on medications   Patient ID: Alvin Daniels, male    DOB: 04-09-1971, 50 y.o.   MRN: [id number]  HPI   Note care provided with in person Spanish translator  50 year old Hispanic man living with HIV that has been perfectly controlled on Biktarvy. He has had bilateral BKA'S with problems this summer with MRSA bacteremia due to stump infection that was debrided. He could not get TEE due to low blood pressure.  He does have chronically low BP but has not been able to afford midodrine which he says he did once receive from [facility] at no cost.  His amputation sites are doing well.   Past Medical History:  Diagnosis Date  . AIDS ([place]) 11/22/2014  . Back pain 06/09/2019  . BKA stump complication ([place]) [id number]  . Chronic diarrhea   . Chronic hepatitis C without hepatic coma ([place]) 11/22/2014  . 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 Diabetic neuropathy ([place])   . ESRD (end stage renal disease) on dialysis [facility])    "TTS; don't remember street name" (05/03/2014)  . Hepatitis C   . HIV INFECTION 06/27/2010   Qualifier: Diagnosis of  By: Nickola Major CMA ( [place]), Geni Bers    . Hypotension 06/02/2012  . Metabolic bone disease [id number]  . MRSA infection   . Normocytic anemia 06/17/2012  . Pancreatitis   . Pressure ulcer of BKA stump, stage 2 ([place]) 11/22/2014  . Renal disorder   . Renal insufficiency   . Severe protein-calorie malnutrition ([place]) 06/17/2012  . Uncontrolled diabetes mellitus with complications ([place]) 0000000   Annotation: uncontrolled Qualifier: Diagnosis of  By: Nickola Major CMA Deborra Medina), Geni Bers      Past Surgical History:  Procedure Laterality Date  . AMPUTATION Left 04/20/2014   Procedure: 3rd toe amputation, 4th Toe Amputation,  5th Toe Amputation;  Surgeon: Newt Minion, MD;  Location: [place];  Service: Orthopedics;  Laterality: Left;  . AMPUTATION Left 05/02/2014   Procedure: Midfoot Amputation;   Surgeon: Newt Minion, MD;  Location: [place];  Service: Orthopedics;  Laterality: Left;  . AMPUTATION Left 06/17/2014   Procedure: AMPUTATION BELOW KNEE;  Surgeon: Newt Minion, MD;  Location: [place];  Service: Orthopedics;  Laterality: Left;  . AMPUTATION Right 09/04/2018   Procedure: RIGHT BELOW KNEE AMPUTATION;  Surgeon: Newt Minion, MD;  Location: [place];  Service: Orthopedics;  Laterality: Right;  RIGHT BELOW KNEE AMPUTATION  . AV FISTULA PLACEMENT Left   . AV FISTULA PLACEMENT Left 05/10/2016   Procedure: Creation Left Arm Brachiocephalic Arteriovenous Fistula and Ligation of Radiocephalic Fistula;  Surgeon: Angelia Mould, MD;  Location: [place];  Service: Vascular;  Laterality: Left;  . FEMUR IM NAIL Left 08/17/2016   Procedure: INTRAMEDULLARY (IM) RETROGRADE FEMORAL NAILING;  Surgeon: Marybelle Killings, MD;  Location: [place];  Service: Orthopedics;  Laterality: Left;  . FOOT AMPUTATION THROUGH ANKLE Left 12/'21/2015   midfoot  . IR GENERIC HISTORICAL Left 05/01/2016   IR THROMBECTOMY AV FISTULA W/THROMBOLYSIS/PTA INC/SHUNT/IMG LEFT 05/01/2016 [name], MD [facility]  . IR GENERIC HISTORICAL  05/01/2016   IR US GUIDE VASC ACCESS LEFT 05/01/2016 [name], MD [facility]  . IR GENERIC HISTORICAL  05/07/2016   IR FLUORO GUIDE CV LINE RIGHT 05/07/2016 Corrie Mckusick, DO [facility]  . IR GENERIC HISTORICAL  05/07/2016   IR US GUIDE VASC ACCESS RIGHT 05/07/2016 Corrie Mckusick, DO [facility]  .  IR GENERIC HISTORICAL  05/22/2016   IR US GUIDE VASC ACCESS RIGHT 05/22/2016 Greggory Keen, MD [facility]  . IR GENERIC HISTORICAL  05/22/2016   IR FLUORO GUIDE CV LINE RIGHT 05/22/2016 Greggory Keen, MD [facility]  . IR REMOVAL TUN CV CATH W/O [place]  08/21/2016  . PERIPHERAL VASCULAR CATHETERIZATION Left 05/09/2016   Procedure: A/V Fistulagram;  Surgeon: Angelia Mould, MD;  Location: [place] CV [facility];  Service: Cardiovascular;  Laterality: Left;  arm  . TEE WITHOUT  CARDIOVERSION N/A 06/22/2018   Procedure: TRANSESOPHAGEAL ECHOCARDIOGRAM (TEE);  Surgeon: Jerline Pain, MD;  Location: [facility] ENDOSCOPY;  Service: Cardiovascular;  Laterality: N/A;    Family History  Problem Relation Age of Onset  . Diabetes Mother   . Diabetes Father       Social History   Socioeconomic History  . Marital status: Single    Spouse name: Not on file  . Number of children: Not on file  . Years of education: Not on file  . Highest education level: Not on file  Occupational History  . Not on file  Tobacco Use  . Smoking status: Never Smoker  . Smokeless tobacco: Never Used  Vaping Use  . Vaping Use: Never used  Substance and Sexual Activity  . Alcohol use: No  . Drug use: No  . Sexual activity: Not Currently    Comment: offered condoms  Other Topics Concern  . Not on file  Social History Narrative   ** Merged History Encounter **       Social Determinants of Health   Financial Resource Strain: Not on file  Food Insecurity: Not on file  Transportation Needs: Not on file  Physical Activity: Not on file  Stress: Not on file  Social Connections: Not on file    No Known Allergies   Current Outpatient Medications:  .  BIKTARVY 50-200-25 MG TABS tablet, TAKE 1 TABLET BY MOUTH DAILY, Disp: 30 tablet, Rfl: 1 .  Blood Glucose Monitoring Suppl (TRUE METRIX METER) DEVI, 1 each by Does not apply route 3 (three) times daily., Disp: 1 Device, Rfl: 0 .  glucose blood (TRUE METRIX BLOOD GLUCOSE TEST) test strip, 1 each by Other route 3 (three) times daily., Disp: 100 each, Rfl: 12 .  insulin NPH-regular Human (HUMULIN 70/30) (70-30) 100 UNIT/ML injection, Inject 15 Units into the skin 2 (two) times daily before a meal. (Patient taking differently: Inject 9 Units into the skin 2 (two) times daily before a meal.), Disp: 10 mL, Rfl: 0 .  Insulin Syringe-Needle U-100 (BD INSULIN SYRINGE ULTRAFINE) 31G X 15/64" 0.5 ML MISC, 1 each by Does not apply route 2 (two) times  daily., Disp: 100 each, Rfl: 5 .  Riboflavin (VITAMIN B2 PO), Take by mouth., Disp: , Rfl:  .  TRUEplus Lancets 28G MISC, 1 each by Does not apply route 3 (three) times daily., Disp: 100 each, Rfl: 11 .  midodrine (PROAMATINE) 10 MG tablet, Take 1 tablet (10 mg total) by mouth 3 (three) times daily with meals. (Patient not taking: Reported on 08/09/2020), Disp: 90 tablet, Rfl: 3\  Review of Systems  Constitutional: Negative for activity change, appetite change, chills, diaphoresis, fatigue, fever and unexpected weight change.  HENT: Negative for congestion, rhinorrhea, sinus pressure, sneezing, sore throat and trouble swallowing.   Eyes: Negative for photophobia and visual disturbance.  Respiratory: Negative for cough, chest tightness, shortness of breath, wheezing and stridor.   Cardiovascular: Negative for chest pain, palpitations and leg swelling.  Gastrointestinal: Negative for abdominal distention, abdominal pain, anal bleeding, blood in stool, constipation, diarrhea, nausea and vomiting.  Musculoskeletal: Negative for arthralgias, back pain, gait problem, joint swelling and myalgias.  Skin: Negative for color change, pallor, rash and wound.  Neurological: Negative for dizziness, tremors, weakness and light-headedness.  Hematological: Negative for adenopathy. Does not bruise/bleed easily.  Psychiatric/Behavioral: Negative for agitation, behavioral problems, confusion, decreased concentration, dysphoric mood and sleep disturbance.       Objective:   Physical Exam Constitutional:      Appearance: He is well-developed.  HENT:     Head: Normocephalic and atraumatic.  Eyes:     Conjunctiva/sclera: Conjunctivae normal.  Cardiovascular:     Rate and Rhythm: Normal rate and regular rhythm.  Pulmonary:     Effort: Pulmonary effort is normal. No respiratory distress.     Breath sounds: No wheezing.  Abdominal:     General: There is no distension.     Palpations: Abdomen is soft.   Musculoskeletal:        General: No tenderness. Normal range of motion.     Cervical back: Normal range of motion and neck supple.  Skin:    General: Skin is warm and dry.     Coloration: Skin is not pale.     Findings: No erythema or rash.  Neurological:     General: No focal deficit present.     Mental Status: He is alert and oriented to person, place, and time.  Psychiatric:        Mood and Affect: Mood normal.        Behavior: Behavior normal.        Thought Content: Thought content normal.        Judgment: Judgment normal.    BKA sites 08/09/2020:  Right     Left           Assessment & Plan:  HIV disease: check labs today and continue Biktarvy, rtc in July  ESRD on HD; continues on HD  Stump infection: appears cured   MRSA bacteremia: not recurred since summer   Low bp: I touched base with Nicoletta Dress and the patient apparently had a Blue Card from [employer] and he will need to see [facility] again to obtain meds that way.  I spent greater than 40 minutes with the patient including greater than 50% of time in face to face counsel of the patient re his HIV, his low bp his stump infection and in coordination of his care.

## 2020-08-10 ENCOUNTER — Telehealth: Payer: Self-pay

## 2020-08-10 LAB — T-HELPER CELL (CD4) - (RCID CLINIC ONLY)
CD4 % Helper T Cell: 24 % — ABNORMAL LOW (ref 33–65)
CD4 T Cell Abs: 363 /uL — ABNORMAL LOW (ref 400–1790)

## 2020-08-10 NOTE — Telephone Encounter (Signed)
RN spoke with Audelia Hives, he will attempt to follow up with patient and get him reconnected with [facility] for assistance with midodrine.   Beryle Flock, RN

## 2020-08-10 NOTE — Telephone Encounter (Signed)
Attempted to call patient with [employer], no answer.   Need to convey to patient that he needs to re-establish care with [facility] in order to apply for assistance for midodrine. Patient has been unable to obtain this medication from the pharmacy. Patient will need to re-apply for [facility] Card at [place] and see if he still qualifies.   Beryle Flock, RN

## 2020-08-11 LAB — CBC WITH DIFFERENTIAL/PLATELET
Absolute Monocytes: 388 cells/uL (ref 200–950)
Basophils Absolute: 57 cells/uL (ref 0–200)
Basophils Relative: 1 %
Eosinophils Absolute: 177 cells/uL (ref 15–500)
Eosinophils Relative: 3.1 %
HCT: 35.5 % — ABNORMAL LOW (ref 38.5–50.0)
Hemoglobin: 11.7 g/dL — ABNORMAL LOW (ref 13.2–17.1)
Lymphs Abs: 1579 cells/uL (ref 850–3900)
MCH: 32.8 pg (ref 27.0–33.0)
MCHC: 33 g/dL (ref 32.0–36.0)
MCV: 99.4 fL (ref 80.0–100.0)
MPV: 9.6 fL (ref 7.5–12.5)
Monocytes Relative: 6.8 %
Neutro Abs: 3500 cells/uL (ref 1500–7800)
Neutrophils Relative %: 61.4 %
Platelets: 235 10*3/uL (ref 140–400)
RBC: 3.57 10*6/uL — ABNORMAL LOW (ref 4.20–5.80)
RDW: 15.7 % — ABNORMAL HIGH (ref 11.0–15.0)
Total Lymphocyte: 27.7 %
WBC: 5.7 10*3/uL (ref 3.8–10.8)

## 2020-08-11 LAB — RPR: RPR Ser Ql: NONREACTIVE

## 2020-08-11 LAB — HIV-1 RNA QUANT-NO REFLEX-BLD
HIV 1 RNA Quant: <20 Copies/mL — ABNORMAL HIGH
HIV-1 RNA Quant, Log: <1.3 Log cps/mL — ABNORMAL HIGH

## 2020-08-17 NOTE — Telephone Encounter (Signed)
Audelia Hives was able to get in touch with the patient who states he has had difficulty applying for the orange card due to not being able to provide all of the paperwork that is asked of him. Patient tells Audelia Hives he is wiling to try applying again. Audelia Hives states he will work with patient to set up appointment with [facility] to begin application process.   Beryle Flock, RN

## 2020-08-17 NOTE — Telephone Encounter (Signed)
Alvin Daniels was successful in setting up appointment with [name] for the patient. Earliest available appointment time is 10/06/20.   Beryle Flock, RN

## 2020-08-24 ENCOUNTER — Other Ambulatory Visit: Payer: Self-pay

## 2020-08-24 ENCOUNTER — Other Ambulatory Visit: Payer: Self-pay | Admitting: Infectious Disease

## 2020-08-24 DIAGNOSIS — B20 Human immunodeficiency virus [HIV] disease: Secondary | ICD-10-CM

## 2020-08-24 MED ORDER — BIKTARVY 50-200-25 MG PO TABS: 1.0000 | ORAL_TABLET | Freq: Every day | ORAL | 5 refills | Status: DC

## 2020-10-04 ENCOUNTER — Other Ambulatory Visit: Payer: Self-pay

## 2020-10-06 ENCOUNTER — Ambulatory Visit: Payer: Self-pay | Admitting: Internal Medicine

## 2020-12-08 ENCOUNTER — Inpatient Hospital Stay (HOSPITAL_COMMUNITY)
Admission: EM | Admit: 2020-12-08 | Discharge: 2020-12-09 | DRG: 314 | Disposition: A | Discharge status: Home / Self Care | Payer: Self-pay | Attending: Family Medicine | Admitting: Family Medicine

## 2020-12-08 ENCOUNTER — Encounter (HOSPITAL_COMMUNITY): Payer: Self-pay

## 2020-12-08 ENCOUNTER — Emergency Department (HOSPITAL_COMMUNITY): Payer: Self-pay

## 2020-12-08 ENCOUNTER — Other Ambulatory Visit: Payer: Self-pay

## 2020-12-08 DIAGNOSIS — E1169 Type 2 diabetes mellitus with other specified complication: Secondary | ICD-10-CM | POA: Diagnosis present

## 2020-12-08 DIAGNOSIS — Z833 Family history of diabetes mellitus: Secondary | ICD-10-CM

## 2020-12-08 DIAGNOSIS — Z8614 Personal history of Methicillin resistant Staphylococcus aureus infection: Secondary | ICD-10-CM

## 2020-12-08 DIAGNOSIS — K529 Noninfective gastroenteritis and colitis, unspecified: Secondary | ICD-10-CM | POA: Diagnosis present

## 2020-12-08 DIAGNOSIS — A419 Sepsis, unspecified organism: Secondary | ICD-10-CM | POA: Diagnosis present

## 2020-12-08 DIAGNOSIS — Z992 Dependence on renal dialysis: Secondary | ICD-10-CM

## 2020-12-08 DIAGNOSIS — L89899 Pressure ulcer of other site, unspecified stage: Secondary | ICD-10-CM | POA: Diagnosis present

## 2020-12-08 DIAGNOSIS — Z89512 Acquired absence of left leg below knee: Secondary | ICD-10-CM

## 2020-12-08 DIAGNOSIS — B182 Chronic viral hepatitis C: Secondary | ICD-10-CM | POA: Diagnosis present

## 2020-12-08 DIAGNOSIS — E1122 Type 2 diabetes mellitus with diabetic chronic kidney disease: Secondary | ICD-10-CM | POA: Diagnosis present

## 2020-12-08 DIAGNOSIS — Z20822 Contact with and (suspected) exposure to covid-19: Secondary | ICD-10-CM | POA: Diagnosis present

## 2020-12-08 DIAGNOSIS — D631 Anemia in chronic kidney disease: Secondary | ICD-10-CM | POA: Diagnosis present

## 2020-12-08 DIAGNOSIS — Z89511 Acquired absence of right leg below knee: Secondary | ICD-10-CM

## 2020-12-08 DIAGNOSIS — B20 Human immunodeficiency virus [HIV] disease: Secondary | ICD-10-CM | POA: Diagnosis present

## 2020-12-08 DIAGNOSIS — T8789 Other complications of amputation stump: Secondary | ICD-10-CM | POA: Diagnosis present

## 2020-12-08 DIAGNOSIS — D649 Anemia, unspecified: Secondary | ICD-10-CM | POA: Diagnosis present

## 2020-12-08 DIAGNOSIS — E861 Hypovolemia: Secondary | ICD-10-CM | POA: Diagnosis present

## 2020-12-08 DIAGNOSIS — Z21 Asymptomatic human immunodeficiency virus [HIV] infection status: Secondary | ICD-10-CM | POA: Diagnosis present

## 2020-12-08 DIAGNOSIS — N186 End stage renal disease: Secondary | ICD-10-CM

## 2020-12-08 DIAGNOSIS — T8119XA Other postprocedural shock, initial encounter: Secondary | ICD-10-CM | POA: Diagnosis present

## 2020-12-08 DIAGNOSIS — I9589 Other hypotension: Principal | ICD-10-CM | POA: Diagnosis present

## 2020-12-08 DIAGNOSIS — Y841 Kidney dialysis as the cause of abnormal reaction of the patient, or of later complication, without mention of misadventure at the time of the procedure: Secondary | ICD-10-CM | POA: Diagnosis present

## 2020-12-08 DIAGNOSIS — Z794 Long term (current) use of insulin: Secondary | ICD-10-CM

## 2020-12-08 DIAGNOSIS — R579 Shock, unspecified: Secondary | ICD-10-CM | POA: Diagnosis present

## 2020-12-08 DIAGNOSIS — L89892 Pressure ulcer of other site, stage 2: Secondary | ICD-10-CM | POA: Diagnosis present

## 2020-12-08 DIAGNOSIS — L98499 Non-pressure chronic ulcer of skin of other sites with unspecified severity: Secondary | ICD-10-CM | POA: Diagnosis present

## 2020-12-08 DIAGNOSIS — E1140 Type 2 diabetes mellitus with diabetic neuropathy, unspecified: Secondary | ICD-10-CM | POA: Diagnosis present

## 2020-12-08 DIAGNOSIS — M542 Cervicalgia: Secondary | ICD-10-CM | POA: Diagnosis present

## 2020-12-08 LAB — COMPREHENSIVE METABOLIC PANEL
ALT: 11 U/L (ref 0–44)
AST: 36 U/L (ref 15–41)
Albumin: 3.7 g/dL (ref 3.5–5.0)
Alkaline Phosphatase: 74 U/L (ref 38–126)
Anion gap: 14 (ref 5–15)
BUN: 13 mg/dL (ref 6–20)
CO2: 33 mmol/L — ABNORMAL HIGH (ref 22–32)
Calcium: 8.6 mg/dL — ABNORMAL LOW (ref 8.9–10.3)
Chloride: 93 mmol/L — ABNORMAL LOW (ref 98–111)
Creatinine, Ser: 3.99 mg/dL — ABNORMAL HIGH (ref 0.61–1.24)
GFR, Estimated: 17 mL/min — ABNORMAL LOW (ref >60)
Glucose, Bld: 57 mg/dL — ABNORMAL LOW (ref 70–99)
Potassium: 3.9 mmol/L (ref 3.5–5.1)
Sodium: 140 mmol/L (ref 135–145)
Total Bilirubin: 1.4 mg/dL — ABNORMAL HIGH (ref 0.3–1.2)
Total Protein: 9.4 g/dL — ABNORMAL HIGH (ref 6.5–8.1)

## 2020-12-08 LAB — LACTIC ACID, PLASMA: Lactic Acid, Venous: 0.8 mmol/L (ref 0.5–1.9)

## 2020-12-08 LAB — CBC WITH DIFFERENTIAL/PLATELET
Abs Immature Granulocytes: 0.06 10*3/uL (ref 0.00–0.07)
Basophils Absolute: 0.1 10*3/uL (ref 0.0–0.1)
Basophils Relative: 0 %
Eosinophils Absolute: 0.1 10*3/uL (ref 0.0–0.5)
Eosinophils Relative: 0 %
HCT: 27.8 % — ABNORMAL LOW (ref 39.0–52.0)
Hemoglobin: 9.3 g/dL — ABNORMAL LOW (ref 13.0–17.0)
Immature Granulocytes: 0 %
Lymphocytes Relative: 5 %
Lymphs Abs: 0.7 10*3/uL (ref 0.7–4.0)
MCH: 33.6 pg (ref 26.0–34.0)
MCHC: 33.5 g/dL (ref 30.0–36.0)
MCV: 100.4 fL — ABNORMAL HIGH (ref 80.0–100.0)
Monocytes Absolute: 0.9 10*3/uL (ref 0.1–1.0)
Monocytes Relative: 7 %
Neutro Abs: 12.2 10*3/uL — ABNORMAL HIGH (ref 1.7–7.7)
Neutrophils Relative %: 88 %
Platelets: 235 10*3/uL (ref 150–400)
RBC: 2.77 MIL/uL — ABNORMAL LOW (ref 4.22–5.81)
RDW: 14.4 % (ref 11.5–15.5)
WBC: 14 10*3/uL — ABNORMAL HIGH (ref 4.0–10.5)
nRBC: 0 % (ref 0.0–0.2)

## 2020-12-08 LAB — RESP PANEL BY RT-PCR (FLU A&B, COVID) ARPGX2
Influenza A by PCR: NEGATIVE
Influenza B by PCR: NEGATIVE
SARS Coronavirus 2 by RT PCR: NEGATIVE

## 2020-12-08 LAB — CBG MONITORING, ED: Glucose-Capillary: 112 mg/dL — ABNORMAL HIGH (ref 70–99)

## 2020-12-08 LAB — TROPONIN I (HIGH SENSITIVITY)
Troponin I (High Sensitivity): 7 ng/L (ref <18)
Troponin I (High Sensitivity): 9 ng/L (ref <18)

## 2020-12-08 LAB — BRAIN NATRIURETIC PEPTIDE: B Natriuretic Peptide: 63.1 pg/mL (ref 0.0–100.0)

## 2020-12-08 MED ORDER — MIDODRINE HCL 5 MG PO TABS
10.0000 mg | ORAL_TABLET | Freq: Three times a day (TID) | ORAL | Status: DC
  Administered 2020-12-08 – 2020-12-09 (×2): 10 mg via ORAL
  Filled 2020-12-08: qty 2
  Filled 2020-12-08 (×2): qty 20
  Filled 2020-12-08 (×3): qty 2

## 2020-12-08 MED ORDER — VANCOMYCIN HCL IN DEXTROSE 1-5 GM/200ML-% IV SOLN: 1000.0000 mg | Freq: Once | INTRAVENOUS | Status: DC

## 2020-12-08 MED ORDER — HEPARIN SODIUM (PORCINE) 5000 UNIT/ML IJ SOLN: 5000.0000 [IU] | Freq: Three times a day (TID) | INTRAMUSCULAR | Status: DC

## 2020-12-08 MED ORDER — BICTEGRAVIR-EMTRICITAB-TENOFOV 50-200-25 MG PO TABS
1.0000 | ORAL_TABLET | Freq: Every day | ORAL | Status: DC
  Filled 2020-12-08: qty 1

## 2020-12-08 MED ORDER — ACETAMINOPHEN 500 MG PO TABS
1000.0000 mg | ORAL_TABLET | Freq: Once | ORAL | Status: AC
  Administered 2020-12-08: 1000 mg via ORAL
  Filled 2020-12-08: qty 2

## 2020-12-08 MED ORDER — SODIUM CHLORIDE 0.9 % IV BOLUS
250.0000 mL | Freq: Once | INTRAVENOUS | Status: AC
  Administered 2020-12-08: 250 mL via INTRAVENOUS

## 2020-12-08 MED ORDER — SODIUM CHLORIDE 0.9% IV SOLUTION: Freq: Once | INTRAVENOUS | Status: DC

## 2020-12-08 MED ORDER — LIDOCAINE-EPINEPHRINE (PF) 2 %-1:200000 IJ SOLN
10.0000 mL | Freq: Once | INTRAMUSCULAR | Status: DC
  Filled 2020-12-08: qty 20

## 2020-12-08 MED ORDER — METRONIDAZOLE 500 MG/100ML IV SOLN: 500.0000 mg | Freq: Three times a day (TID) | INTRAVENOUS | Status: DC

## 2020-12-08 MED ORDER — DIPHENHYDRAMINE HCL 50 MG/ML IJ SOLN
25.0000 mg | Freq: Once | INTRAMUSCULAR | Status: AC
  Administered 2020-12-08: 25 mg via INTRAVENOUS
  Filled 2020-12-08: qty 1

## 2020-12-08 MED ORDER — LACTATED RINGERS IV BOLUS
250.0000 mL | Freq: Once | INTRAVENOUS | Status: AC
  Administered 2020-12-08: 250 mL via INTRAVENOUS

## 2020-12-08 MED ORDER — METOCLOPRAMIDE HCL 5 MG/ML IJ SOLN
10.0000 mg | Freq: Once | INTRAMUSCULAR | Status: AC
  Administered 2020-12-08: 10 mg via INTRAVENOUS
  Filled 2020-12-08: qty 2

## 2020-12-08 MED ORDER — MIDODRINE HCL 5 MG PO TABS: 10.0000 mg | ORAL_TABLET | Freq: Three times a day (TID) | ORAL | Status: DC

## 2020-12-08 MED ORDER — INSULIN DETEMIR 100 UNIT/ML ~~LOC~~ SOLN
5.0000 [IU] | Freq: Two times a day (BID) | SUBCUTANEOUS | Status: DC
  Administered 2020-12-09: 5 [IU] via SUBCUTANEOUS
  Filled 2020-12-08 (×3): qty 0.05

## 2020-12-08 MED ORDER — ACETAMINOPHEN 325 MG PO TABS: 650.0000 mg | ORAL_TABLET | Freq: Four times a day (QID) | ORAL | Status: DC | PRN

## 2020-12-08 MED ORDER — SODIUM CHLORIDE 0.9 % IV SOLN
2.0000 g | Freq: Once | INTRAVENOUS | Status: AC
  Administered 2020-12-09: 2 g via INTRAVENOUS
  Filled 2020-12-08: qty 2

## 2020-12-08 MED ORDER — NOREPINEPHRINE 4 MG/250ML-% IV SOLN
0.0000 ug/min | INTRAVENOUS | Status: DC
  Administered 2020-12-08: 4 ug/min via INTRAVENOUS
  Filled 2020-12-08: qty 250

## 2020-12-08 MED ORDER — LACTATED RINGERS IV BOLUS
1000.0000 mL | Freq: Once | INTRAVENOUS | Status: AC
  Administered 2020-12-08: 1000 mL via INTRAVENOUS

## 2020-12-08 MED ORDER — INSULIN ASPART 100 UNIT/ML IJ SOLN
0.0000 [IU] | Freq: Three times a day (TID) | INTRAMUSCULAR | Status: DC
  Filled 2020-12-08: qty 0.06

## 2020-12-08 MED ORDER — SODIUM CHLORIDE 0.9 % IV SOLN: 2.0000 g | Freq: Once | INTRAVENOUS | Status: DC

## 2020-12-08 MED ORDER — ACETAMINOPHEN 650 MG RE SUPP: 650.0000 mg | Freq: Four times a day (QID) | RECTAL | Status: DC | PRN

## 2020-12-08 NOTE — ED Provider Notes (Signed)
[facility] DEPT Provider Note   CSN: [id number] Arrival date & time: 12/08/20  1345     History Chief Complaint  Patient presents with   Weakness   Back Pain   Hip Pain   Headache    Alvin Daniels is a 50 y.o. male.  HPI   50 year old male with a history of ESRD on HD MWF, HIV/AIDS (last CD4 count 363 08/09/2020) presenting to the emergency department after dialysis today with a viral syndrome.  The history is provided with the aid of a Spanish language interpreter.  The patient endorses a headache, some neck discomfort, generalized myalgias after full dialysis session earlier this morning.  He states that some of his aches are similar to aches that he experiences postdialysis.  His systolic blood pressure status post dialysis was around 90 this morning. He states that his blood pressures are normally soft and he previously been prescribed midodrine for soft blood pressures but stopped taking it due to his inability to afford the medication several months ago.  He states that he endorses some lightheadedness with ambulation.   He denies any fevers, neck rigidity.  He endorses bilateral hip pain.  He also complains of a lesion on his scalp that has been painful and raised.  He states that he has been scratching at it.   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[place])    Right   Diabetic neuropathy ([place])    ESRD (end stage renal disease) on dialysis ([place])    "TTS; don't remember street name" (05/03/2014)   Hepatitis C    HIV INFECTION 06/27/2010   Qualifier: Diagnosis of  By: Nickola Major CMA ( AAMA), Jacqueline     Hypotension [id number]   Metabolic bone disease [id number]   MRSA infection    Normocytic anemia 06/17/2012   Pancreatitis    Pressure ulcer of  BKA stump, stage 2 ([place]) 11/22/2014   Renal disorder    Renal insufficiency    Severe protein-calorie malnutrition ([place]) 06/17/2012   Uncontrolled diabetes mellitus with complications ([place]) 0000000   Annotation: uncontrolled Qualifier: Diagnosis of  By: Nickola Major CMA Deborra Medina), Jacqueline      Patient Active Problem List   Diagnosis Date Noted   Shock ([place]) 12/08/2020   Sepsis ([place]) 02/28/2020   Amputation stump infection ([place]) 12/02/2019   Scapular fracture 09/28/2019   Chest pain 09/27/2019   Hx MRSA infection 09/21/2019   BKA stump complication ([place]) [id number]   Back pain 06/09/2019   Abscess of foot    Abscess of ankle 08/29/2018   Charcot foot due to diabetes mellitus ([place]) 07/23/2018   MRSA bacteremia    Bacteremia 06/18/2018   Diabetic foot ulcer ([place]) 11/19/2017   Femur fracture, left ([place]) 08/16/2016   HIV (human immunodeficiency virus infection) ([place]) 08/16/2016   GERD (gastroesophageal reflux disease) 05/24/2016   Burn 04/10/2016   Pressure ulcer of BKA stump, stage 2 ([place]) 11/22/2014   Chronic hepatitis C without hepatic coma ([place]) 11/22/2014   S/P bilateral BKA (below knee amputation) ([place]) 06/17/2014   HIV disease ([place])    Poor dentition 10/25/2013   ESRD on hemodialysis ([place]) 10/25/2013   Vision changes 06/21/2013   Orthostatic headache 04/15/2013   Anemia of chronic renal failure [id number]   Metabolic bone  disease 06/18/2012   Chronic diarrhea 06/17/2012   Severe protein-calorie malnutrition ([place]) 06/17/2012   Normocytic anemia 06/17/2012   Low blood pressure 06/02/2012   Cellulitis of right lower extremity 10/19/2010   Polyneuropathy in diabetes(357.2) 06/27/2010   Diabetes mellitus with ESRD (end-stage renal disease) ([place]) 01/03/2010    Past Surgical History:  Procedure Laterality Date   AMPUTATION Left 04/20/2014   Procedure: 3rd toe amputation, 4th Toe Amputation,  5th Toe Amputation;  Surgeon: Newt Minion, MD;  Location: [facility];  Service:  Orthopedics;  Laterality: Left;   AMPUTATION Left 05/02/2014   Procedure: Midfoot Amputation;  Surgeon: Newt Minion, MD;  Location: [facility];  Service: Orthopedics;  Laterality: Left;   AMPUTATION Left 06/17/2014   Procedure: AMPUTATION BELOW KNEE;  Surgeon: Newt Minion, MD;  Location: [facility];  Service: Orthopedics;  Laterality: Left;   AMPUTATION Right 09/04/2018   Procedure: RIGHT BELOW KNEE AMPUTATION;  Surgeon: Newt Minion, MD;  Location: [facility];  Service: Orthopedics;  Laterality: Right;  RIGHT BELOW KNEE AMPUTATION   AV FISTULA PLACEMENT Left    AV FISTULA PLACEMENT Left 05/10/2016   Procedure: Creation Left Arm Brachiocephalic Arteriovenous Fistula and Ligation of Radiocephalic Fistula;  Surgeon: Angelia Mould, MD;  Location: [facility];  Service: Vascular;  Laterality: Left;   FEMUR IM NAIL Left 08/17/2016   Procedure: INTRAMEDULLARY (IM) RETROGRADE FEMORAL NAILING;  Surgeon: Marybelle Killings, MD;  Location: [facility];  Service: Orthopedics;  Laterality: Left;   FOOT AMPUTATION THROUGH ANKLE Left 12/'21/2015   midfoot   IR GENERIC HISTORICAL Left 05/01/2016   IR THROMBECTOMY AV FISTULA W/THROMBOLYSIS/PTA INC/SHUNT/IMG LEFT 05/01/2016 [name], MD [facility]   IR GENERIC HISTORICAL  05/01/2016   IR US GUIDE VASC ACCESS LEFT 05/01/2016 [name], MD [facility]   IR GENERIC HISTORICAL  05/07/2016   IR FLUORO GUIDE CV LINE RIGHT 05/07/2016 Corrie Mckusick, DO [facility]   IR GENERIC HISTORICAL  05/07/2016   IR US GUIDE VASC ACCESS RIGHT 05/07/2016 Corrie Mckusick, DO [facility]   IR GENERIC HISTORICAL  05/22/2016   IR US GUIDE VASC ACCESS RIGHT 05/22/2016 Greggory Keen, MD [facility]   IR GENERIC HISTORICAL  05/22/2016   IR FLUORO GUIDE CV LINE RIGHT 05/22/2016 Greggory Keen, MD [facility]   IR REMOVAL TUN CV CATH W/O [facility]  08/21/2016   PERIPHERAL VASCULAR CATHETERIZATION Left 05/09/2016   Procedure: A/V Fistulagram;  Surgeon: Angelia Mould, MD;  Location: [facility]  CV LAB;  Service: Cardiovascular;  Laterality: Left;  arm   TEE WITHOUT CARDIOVERSION N/A 06/22/2018   Procedure: TRANSESOPHAGEAL ECHOCARDIOGRAM (TEE);  Surgeon: Jerline Pain, MD;  Location: [facility] ENDOSCOPY;  Service: Cardiovascular;  Laterality: N/A;       Family History  Problem Relation Age of Onset   Diabetes Mother    Diabetes Father     Social History   Tobacco Use   Smoking status: Never   Smokeless tobacco: Never  Vaping Use   Vaping Use: Never used  Substance Use Topics   Alcohol use: No   Drug use: No    Home Medications Prior to Admission medications   Medication Sig Start Date End Date Taking? Authorizing Provider  bictegravir-emtricitabine-tenofovir AF (BIKTARVY) 50-200-25 MG TABS tablet Take 1 tablet by mouth daily. 08/24/20  Yes Tommy Medal, Lavell Islam, MD  insulin NPH-regular Human (70-30) 100 UNIT/ML injection INJECT 15 UNITS INTO THE SKIN 2 (TWO) TIMES DAILY BEFORE A MEAL. Patient taking differently: Inject  12 Units into the skin 2 (two) times daily before a meal. 02/10/20 02/09/21 Yes Newlin, Enobong, MD  Blood Glucose Monitoring Suppl (TRUE METRIX METER) DEVI 1 each by Does not apply route 3 (three) times daily. 09/15/18   [name], MD  glucose blood (TRUE METRIX BLOOD GLUCOSE TEST) test strip 1 each by Other route 3 (three) times daily. 09/15/18   [name], MD  Insulin Syringe-Needle U-100 (BD INSULIN SYRINGE ULTRAFINE) 31G X 15/64" 0.5 ML MISC 1 each by Does not apply route 2 (two) times daily. 02/10/20   [name], MD  midodrine (PROAMATINE) 10 MG tablet Take 1 tablet (10 mg total) by mouth 3 (three) times daily with meals. Patient not taking: No sig reported 12/10/19   Regalado, Belkys A, MD  TRUEplus Lancets 28G MISC 1 each by Does not apply route 3 (three) times daily. 09/15/18   [name], MD    Allergies    Patient has no known allergies.  Review of Systems   Review of Systems  Constitutional:  Positive for fatigue. Negative for chills  and fever.  HENT:  Negative for ear pain and sore throat.   Eyes:  Negative for pain and visual disturbance.  Respiratory:  Negative for cough and shortness of breath.   Cardiovascular:  Negative for chest pain and palpitations.  Gastrointestinal:  Negative for abdominal pain, diarrhea, nausea and vomiting.  Genitourinary:  Negative for flank pain.  Musculoskeletal:  Positive for back pain, myalgias and neck pain. Negative for arthralgias and neck stiffness.  Skin:  Negative for color change and rash.  Neurological:  Positive for headaches. Negative for seizures and syncope.  All other systems reviewed and are negative.  Physical Exam Updated Vital Signs BP 118/70   Pulse 97   Temp 99 F (37.2 C) (Oral)   Resp 16   Ht 5' (1.524 m)   Wt 52 kg   SpO2 95%   BMI 22.39 kg/m   Physical Exam Vitals and nursing note reviewed.  Constitutional:      General: He is not in acute distress.    Appearance: He is well-developed. He is not ill-appearing or diaphoretic.  HENT:     Head: Normocephalic and atraumatic.     Comments: Raised cystic lesion along the top of the patient's scalp with ulcerated excoriation present.  Solid mass underneath the skin palpated, no fluctuance or erythema Eyes:     Conjunctiva/sclera: Conjunctivae normal.  Cardiovascular:     Rate and Rhythm: Normal rate and regular rhythm.     Heart sounds: No murmur heard. Pulmonary:     Effort: Pulmonary effort is normal. No respiratory distress.     Breath sounds: Normal breath sounds.  Abdominal:     Palpations: Abdomen is soft.     Tenderness: There is no abdominal tenderness.  Musculoskeletal:     Cervical back: Neck supple.     Comments: Bilateral BKA's, some skin breakdown on the lateral aspect of the knee bilaterally at an area of high friction with his prosthesis.  Skin:    General: Skin is warm and dry.  Neurological:     General: No focal deficit present.     Mental Status: He is alert and oriented to  person, place, and time.     GCS: GCS eye subscore is 4. GCS verbal subscore is 5. GCS motor subscore is 6.     Cranial Nerves: Cranial nerves are intact. No cranial nerve deficit, dysarthria or facial asymmetry.  Sensory: Sensation is intact. No sensory deficit.     Motor: Motor function is intact. No weakness.     Coordination: Coordination is intact.     Gait: Gait normal.    ED Results / Procedures / Treatments   Labs (all labs ordered are listed, but only abnormal results are displayed) Labs Reviewed  COMPREHENSIVE METABOLIC PANEL - Abnormal; Notable for the following components:      Result Value   Chloride 93 (*)    CO2 33 (*)    Glucose, Bld 57 (*)    Creatinine, Ser 3.99 (*)    Calcium 8.6 (*)    Total Protein 9.4 (*)    Total Bilirubin 1.4 (*)    GFR, Estimated 17 (*)    All other components within normal limits  CBC WITH DIFFERENTIAL/PLATELET - Abnormal; Notable for the following components:   WBC 14.0 (*)    RBC 2.77 (*)    Hemoglobin 9.3 (*)    HCT 27.8 (*)    MCV 100.4 (*)    Neutro Abs 12.2 (*)    All other components within normal limits  CBG MONITORING, ED - Abnormal; Notable for the following components:   Glucose-Capillary 112 (*)    All other components within normal limits  RESP PANEL BY RT-PCR (FLU A&B, COVID) [id number]  CULTURE, BLOOD (ROUTINE X 2)  CULTURE, BLOOD (ROUTINE X 2)  BRAIN NATRIURETIC PEPTIDE  LACTIC ACID, PLASMA  LACTIC ACID, PLASMA  HEMOGLOBIN [id number]  BASIC METABOLIC PANEL  CBC  PHOSPHORUS  MAGNESIUM  OCCULT BLOOD X 1 CARD TO LAB, STOOL  PROCALCITONIN  TYPE AND SCREEN  TROPONIN I (HIGH SENSITIVITY)  TROPONIN I (HIGH SENSITIVITY)    EKG EKG Interpretation  Date/Time:  Friday December 08 2020 17:45:40 EDT Ventricular Rate:  96 PR Interval:  130 QRS Duration: 93 QT Interval:  366 QTC Calculation: 463 R Axis:   -10 Text Interpretation: Sinus rhythm RSR' in V1 or V2, probably normal variant Minimal ST depression, diffuse leads  Confirmed by Regan Lemming ([id number]) on 12/08/2020 6:51:29 PM  Radiology DG Chest Portable 1 View  Result Date: 12/08/2020 CLINICAL DATA:  hypotension, somnolence EXAM: PORTABLE CHEST 1 VIEW COMPARISON:  02/27/2020 FINDINGS: Heart and mediastinal contours are within normal limits. No focal opacities or effusions. No acute bony abnormality. IMPRESSION: No active disease. Electronically Signed   By: Rolm Baptise M.D.   On: 12/08/2020 18:10    Procedures .Critical Care  Date/Time: 12/09/2020 12:03 PM Performed by: Regan Lemming, MD Authorized by: Regan Lemming, MD   Critical care provider statement:    Critical care time (minutes):  52   Critical care was necessary to treat or prevent imminent or life-threatening deterioration of the following conditions:  Circulatory failure   Critical care was time spent personally by me on the following activities:  Discussions with consultants, evaluation of patient's response to treatment, examination of patient, ordering and performing treatments and interventions, ordering and review of laboratory studies, ordering and review of radiographic studies, pulse oximetry, re-evaluation of patient's condition, obtaining history from patient or surrogate and review of old charts   Care discussed with: admitting provider     Medications Ordered in ED Medications  norepinephrine (LEVOPHED) '4mg'$  in 252m premix infusion (0 mcg/min Intravenous Stopped 12/09/20 0003)  midodrine (PROAMATINE) tablet 10 mg (10 mg Oral Given 12/08/20 2238)  bictegravir-emtricitabine-tenofovir AF (BIKTARVY) 50-200-25 MG per tablet 1 tablet (has no administration in time range)  insulin aspart (novoLOG) injection 0-6 Units (has no  administration in time range)  insulin detemir (LEVEMIR) injection 5 Units (has no administration in time range)  heparin injection 5,000 Units (has no administration in time range)  acetaminophen (TYLENOL) tablet 650 mg (has no administration in time range)    Or   acetaminophen (TYLENOL) suppository 650 mg (has no administration in time range)  ceFEPIme (MAXIPIME) 2 g in sodium chloride 0.9 % 100 mL IVPB (has no administration in time range)  metroNIDAZOLE (FLAGYL) IVPB 500 mg (has no administration in time range)  vancomycin (VANCOCIN) IVPB 1000 mg/200 mL premix (has no administration in time range)  0.9 %  sodium chloride infusion (Manually program via Guardrails IV Fluids) (has no administration in time range)  sodium chloride 0.9 % bolus 250 mL (0 mLs Intravenous Stopped 12/08/20 1535)  acetaminophen (TYLENOL) tablet 1,000 mg (1,000 mg Oral Given 12/08/20 1528)  metoCLOPramide (REGLAN) injection 10 mg (10 mg Intravenous Given 12/08/20 1532)  diphenhydrAMINE (BENADRYL) injection 25 mg (25 mg Intravenous Given 12/08/20 1530)  lactated ringers bolus 250 mL (0 mLs Intravenous Stopped 12/08/20 1615)  lactated ringers bolus 1,000 mL (0 mLs Intravenous Stopped 12/08/20 1852)    ED Course  I have reviewed the triage vital signs and the nursing notes.  Pertinent labs & imaging results that were available during my care of the patient were reviewed by me and considered in my medical decision making (see chart for details).    MDM Rules/Calculators/A&P                           50 year old male with a medical history significant for ESRD on HD MWF, HIV, DM who presents with a viral syndrome with headache and fatigue.  He has been complaining of a nodule on his scalp that has been itching and painful and hard.  He denies any fever, chills, cough, chest pain, shortness of breath, abdominal pain, nausea or vomiting or diarrhea.  He denies any dysuria as he does not make urine.  He has had problems with hypotension in the past and had previously been on midodrine but states that he has not taken the medication in several months due to an inability to afford it.  He presented hypotensive after dialysis today.  On arrival, the patient was hypotensive BP 60/39,  mentating well, maps in the low 50s.  He was immediately resuscitated with 250 cc IV fluids with subsequent improvement in his systolic blood pressure to the 90s.  He became hypotensive again and was administered additional IV fluids to a total of 1.5 L which equates to 30 cc/kg of his body weight.  Given persistent hypotension, he was started on low-dose Levophed at 3-4 MCG per minute.  Labs reviewed and EKG reviewed.  EKG with sinus rhythm.  Chest x-ray performed negative for acute cardiac or pulmonary disease.  CMP notable for bicarbonate 33, BUN 13, blood glucose 57 with subsequent improvement on recheck without intervention.  CBC with a nonspecific leukocytosis to 14, hemoglobin 9.3, lactic acid normal, troponin and BNP checked and normal.  COVID-19 and influenza PCR negative.  The patient's headache was treated with Reglan, Benadryl, acetaminophen in the ED with complete resolution.  I have very low suspicion for bacterial meningitis or encephalitis at this time.  I do not see any other indication for antibiotic treatment as the patient endorses no infectious symptoms.  He has some skin breakdown along an ulcerative lesion along his head but does not appear overly infected and  appears chronic.  He has some skin breakdown along his lateral aspects of his knees where his prostheses have been irritating the skin.  I also see no clear evidence for cellulitis here.  The patient's abdomen is soft, nontender, nondistended and he did denies any infectious GI symptoms.  The patient did not meet SIRS criteria and has no infectious symptoms at this time.  I do not think antibiotics are warranted.  He has no meningismus on physical exam.  He has not altered, GCS 15, AAOx3, neurologically intact.  I spoke with the on-call critical care physician given the patient's Levophed requirement who felt that the patient's hypotension was likely due to poor vascular tone, recommended restarting midodrine and weaning the patient  off of Levophed.  I spoke to hospitalist medicine for admission who agreed with the plan.  The patient was administered 10 mg of midodrine and 1 hour later was successfully weaned off of Levophed with no further episodes of hypotension.  The patient was then admitted to the hospitalist service in guarded condition. Final Clinical Impression(s) / ED Diagnoses Final diagnoses:  Shock [facility])    Rx / DC Orders ED Discharge Orders     None        Regan Lemming, MD 12/09/20 1211

## 2020-12-08 NOTE — ED Triage Notes (Signed)
Pt reports having a headache, bilateral leg, back, neck, and bilateral hip pain that started yesterday around 5pm. Pt is bilateral leg amputee. Hx DM2, dialysis pt. Pt states he did received dialysis treatment today.

## 2020-12-08 NOTE — H&P (Addendum)
History and Physical    Alvin Daniels [id number] DOB: [birth year] DOA: 12/08/2020  PCP: Patient, No Pcp Per (Inactive)   Patient coming from: Home  Chief Complaint: Headache, fatigue   HPI: Alvin Daniels is a 50 y.o. male with medical history significant for ESRD on hemodialysis MWF, HIV, insulin-dependent diabetes mellitus, and chronic hypertension, now presenting to the emergency department with headache and fatigue.  Patient reports that he developed a nodule on his scalp a few days ago that was causing some pain, but then developed more of a generalized headache yesterday that was mild, he did not eat or drink anything today, completed dialysis, and had worsening in his headache as well as fatigue after dialysis which prompted his presentation to the ED.  He denies any fevers, chills, cough, shortness of breath, abdominal pain, nausea, vomiting, or diarrhea.  He does not urinate.  He has been unable to afford midodrine.  Notably, he was admitted 1 year ago with MRSA bacteremia, was unable to undergo TEE due to low blood pressures, and was treated with 6 weeks of vancomycin with HD.  ED Course: Upon arrival to the ED, patient is found to be afebrile, saturating well on room air, and with blood pressure 60/39.  EKG features sinus rhythm and chest x-ray negative for acute cardiopulmonary disease.  Chemistry panel notable for bicarbonate 33, BUN 13, glucose 57.  CBC features a leukocytosis to 14,000 and hemoglobin 9.3.  Lactic acid, troponin, and BNP are normal.  COVID and influenza PCR negative.  Patient was given 2.5 L of IV fluids, Reglan, Benadryl, and acetaminophen in the ED.  He remained hypotensive and was started on Levophed.  PCCM recommended administering midodrine, weaning Levophed as tolerated, and admitting to the hospitalist service.  Review of Systems:  All other systems reviewed and apart from HPI, are negative.  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[place])    Right   Diabetic neuropathy ([place])    ESRD (end stage renal disease) on dialysis ([place])    "TTS; don't remember street name" (05/03/2014)   Hepatitis C    HIV INFECTION 06/27/2010   Qualifier: Diagnosis of  By: Nickola Major CMA ( AAMA), Jacqueline     Hypotension [id number]   Metabolic bone disease [id number]   MRSA infection    Normocytic anemia 06/17/2012   Pancreatitis    Pressure ulcer of BKA stump, stage 2 ([place]) 11/22/2014   Renal disorder    Renal insufficiency    Severe protein-calorie malnutrition ([place]) 06/17/2012   Uncontrolled diabetes mellitus with complications ([place]) 0000000   Annotation: uncontrolled Qualifier: Diagnosis of  By: Nickola Major CMA Deborra Medina), Geni Bers      Past Surgical History:  Procedure Laterality Date   AMPUTATION Left 04/20/2014   Procedure: 3rd toe amputation, 4th Toe Amputation,  5th Toe Amputation;  Surgeon: Newt Minion, MD;  Location: [place];  Service: Orthopedics;  Laterality: Left;   AMPUTATION Left 05/02/2014   Procedure: Midfoot Amputation;  Surgeon: Newt Minion, MD;  Location: [place];  Service: Orthopedics;  Laterality: Left;   AMPUTATION Left 06/17/2014   Procedure: AMPUTATION BELOW KNEE;  Surgeon: Newt Minion, MD;  Location: [place];  Service: Orthopedics;  Laterality: Left;   AMPUTATION Right 09/04/2018   Procedure: RIGHT BELOW KNEE  AMPUTATION;  Surgeon: Newt Minion, MD;  Location: [place];  Service: Orthopedics;  Laterality: Right;  RIGHT BELOW KNEE AMPUTATION   AV FISTULA PLACEMENT Left    AV FISTULA PLACEMENT Left 05/10/2016   Procedure: Creation Left Arm Brachiocephalic Arteriovenous Fistula and Ligation of Radiocephalic Fistula;  Surgeon: Angelia Mould, MD;  Location: [place];  Service: Vascular;  Laterality: Left;   FEMUR IM NAIL Left 08/17/2016    Procedure: INTRAMEDULLARY (IM) RETROGRADE FEMORAL NAILING;  Surgeon: Marybelle Killings, MD;  Location: [place];  Service: Orthopedics;  Laterality: Left;   FOOT AMPUTATION THROUGH ANKLE Left 12/'21/2015   midfoot   IR GENERIC HISTORICAL Left 05/01/2016   IR THROMBECTOMY AV FISTULA W/THROMBOLYSIS/PTA INC/SHUNT/IMG LEFT 05/01/2016 [name], MD [facility]   IR GENERIC HISTORICAL  05/01/2016   IR US GUIDE VASC ACCESS LEFT 05/01/2016 [name], MD [facility]   IR GENERIC HISTORICAL  05/07/2016   IR FLUORO GUIDE CV LINE RIGHT 05/07/2016 Corrie Mckusick, DO [facility]   IR GENERIC HISTORICAL  05/07/2016   IR US GUIDE VASC ACCESS RIGHT 05/07/2016 Corrie Mckusick, DO [facility]   IR GENERIC HISTORICAL  05/22/2016   IR US GUIDE VASC ACCESS RIGHT 05/22/2016 Greggory Keen, MD [facility]   IR GENERIC HISTORICAL  05/22/2016   IR FLUORO GUIDE CV LINE RIGHT 05/22/2016 Greggory Keen, MD [facility]   IR REMOVAL TUN CV CATH W/O [facility]  08/21/2016   PERIPHERAL VASCULAR CATHETERIZATION Left 05/09/2016   Procedure: A/V Fistulagram;  Surgeon: Angelia Mould, MD;  Location: [place] [facility];  Service: Cardiovascular;  Laterality: Left;  arm   TEE WITHOUT CARDIOVERSION N/A 06/22/2018   Procedure: TRANSESOPHAGEAL ECHOCARDIOGRAM (TEE);  Surgeon: Jerline Pain, MD;  Location: [facility];  Service: Cardiovascular;  Laterality: N/A;    Social History:   reports that he has never smoked. He has never used smokeless tobacco. He reports that he does not drink alcohol and does not use drugs.  No Known Allergies  Family History  Problem Relation Age of Onset   Diabetes Mother    Diabetes Father      Prior to Admission medications   Medication Sig Start Date End Date Taking? Authorizing Provider  bictegravir-emtricitabine-tenofovir AF (BIKTARVY) 50-200-25 MG TABS tablet Take 1 tablet by mouth daily. 08/24/20  Yes Tommy Medal, Lavell Islam, MD  insulin NPH-regular Human (70-30) 100 UNIT/ML injection  INJECT 15 UNITS INTO THE SKIN 2 (TWO) TIMES DAILY BEFORE A MEAL. Patient taking differently: Inject 12 Units into the skin 2 (two) times daily before a meal. 02/10/20 02/09/21 Yes Newlin, Enobong, MD  Blood Glucose Monitoring Suppl (TRUE METRIX METER) DEVI 1 each by Does not apply route 3 (three) times daily. 09/15/18   [name], MD  glucose blood (TRUE METRIX BLOOD GLUCOSE TEST) test strip 1 each by Other route 3 (three) times daily. 09/15/18   [name], MD  Insulin Syringe-Needle U-100 (BD INSULIN SYRINGE ULTRAFINE) 31G X 15/64" 0.5 ML MISC 1 each by Does not apply route 2 (two) times daily. 02/10/20   [name], MD  midodrine (PROAMATINE) 10 MG tablet Take 1 tablet (10 mg total) by mouth 3 (three) times daily with meals. Patient not taking: No sig reported 12/10/19   Regalado, Belkys A, MD  TRUEplus Lancets 28G MISC 1 each by Does not apply route 3 (three) times daily. 09/15/18   [name], MD    Physical Exam: Vitals:   12/08/20 2250 12/08/20 2330 12/08/20 2345 12/09/20  0000  BP: 111/65 (!) 142/74 118/70 118/70  Pulse: 97 (!) 105 98 97  Resp: (!) 0 (!) 6 (!) 0 16  Temp:      TempSrc:      SpO2: 94% 100% 94% 95%  Weight:      Height:        Constitutional: NAD, calm  Eyes: PERTLA, lids and conjunctivae normal ENMT: Mucous membranes are moist. Posterior pharynx clear of any exudate or lesions.   Neck:  supple, no masses  Respiratory: no wheezing, no crackles. No accessory muscle use.  Cardiovascular: S1 & S2 heard, regular rate and rhythm. No extremity edema.   Abdomen: No distension, no tenderness, soft. Bowel sounds active.  Musculoskeletal: no clubbing / cyanosis. No joint deformity upper and lower extremities.   Skin: b/l BKA stump ulcerations over bony prominences; nodule on scalp with crust. Warm, dry, well-perfused. Neurologic: CN 2-12 grossly intact. Sensation intact. Moving all extremities.  Psychiatric: Alert and oriented to person, place, and situation.  Very pleasant and cooperative.    Labs and Imaging on Admission: I have personally reviewed following labs and imaging studies  CBC: Recent Labs  Lab 12/08/20 1434  WBC 14.0*  NEUTROABS 12.2*  HGB 9.3*  HCT 27.8*  MCV 100.4*  PLT [id number]   Basic Metabolic Panel: Recent Labs  Lab 12/08/20 1434  NA 140  K 3.9  CL 93*  CO2 33*  GLUCOSE 57*  BUN 13  CREATININE 3.99*  CALCIUM 8.6*   GFR: Estimated Creatinine Clearance: 15.7 mL/min (A) (by C-G formula based on SCr of 3.99 mg/dL (H)). Liver Function Tests: Recent Labs  Lab 12/08/20 1434  AST 36  ALT 11  ALKPHOS 74  BILITOT 1.4*  PROT 9.4*  ALBUMIN 3.7   No results for input(s): LIPASE, AMYLASE in the last 168 hours. No results for input(s): AMMONIA in the last 168 hours. Coagulation Profile: No results for input(s): INR, PROTIME in the last 168 hours. Cardiac Enzymes: No results for input(s): CKTOTAL, CKMB, CKMBINDEX, TROPONINI in the last 168 hours. BNP (last 3 results) No results for input(s): PROBNP in the last 8760 hours. HbA1C: No results for input(s): HGBA1C in the last 72 hours. CBG: Recent Labs  Lab 12/08/20 2015  GLUCAP 112*   Lipid Profile: No results for input(s): CHOL, HDL, LDLCALC, TRIG, CHOLHDL, LDLDIRECT in the last 72 hours. Thyroid Function Tests: No results for input(s): TSH, T4TOTAL, FREET4, T3FREE, THYROIDAB in the last 72 hours. Anemia Panel: No results for input(s): VITAMINB12, FOLATE, FERRITIN, TIBC, IRON, RETICCTPCT in the last 72 hours. Urine analysis:    Component Value Date/Time   COLORURINE YELLOW 07/04/2010 1840   APPEARANCEUR TURBID (A) 07/04/2010 1840   LABSPEC 1.013 07/04/2010 1840   PHURINE 7.0 07/04/2010 1840   GLUCOSEU NEG mg/dL 07/04/2010 1840   HGBUR TRACE (A) 03/13/2010 1824   BILIRUBINUR NEG 07/04/2010 1840   KETONESUR NEG mg/dL 07/04/2010 1840   PROTEINUR > 300 mg/dL (A) 07/04/2010 1840   UROBILINOGEN 0.2 07/04/2010 1840   NITRITE NEG 07/04/2010 1840    LEUKOCYTESUR LARGE (A) 07/04/2010 1840   Sepsis Labs: '@LABRCNTIP'$ (procalcitonin:4,lacticidven:4) ) Recent Results (from the past 240 hour(s))  Resp Panel by RT-PCR (Flu A&B, Covid) Nasopharyngeal Swab     Status: None   Collection Time: 12/08/20  2:45 PM   Specimen: Nasopharyngeal Swab; Nasopharyngeal(NP) swabs in vial transport medium  Result Value Ref Range Status   SARS Coronavirus 2 by RT PCR NEGATIVE NEGATIVE Final    Comment: (NOTE) SARS-CoV-2  target nucleic acids are NOT DETECTED.  The SARS-CoV-2 RNA is generally detectable in upper respiratory specimens during the acute phase of infection. The lowest concentration of SARS-CoV-2 viral copies this assay can detect is 138 copies/mL. A negative result does not preclude SARS-Cov-2 infection and should not be used as the sole basis for treatment or other patient management decisions. A negative result may occur with  improper specimen collection/handling, submission of specimen other than nasopharyngeal swab, presence of viral mutation(s) within the areas targeted by this assay, and inadequate number of viral copies(<138 copies/mL). A negative result must be combined with clinical observations, patient history, and epidemiological information. The expected result is Negative.  Fact Sheet for Patients:  EntrepreneurPulse.com.au  Fact Sheet for Healthcare Providers:  IncredibleEmployment.be  This test is no t yet approved or cleared by the [place] FDA and  has been authorized for detection and/or diagnosis of SARS-CoV-2 by FDA under an Emergency Use Authorization (EUA). This EUA will remain  in effect (meaning this test can be used) for the duration of the COVID-19 declaration under Section 564(b)(1) of the Act, 21 U.S.C.section 360bbb-3(b)(1), unless the authorization is terminated  or revoked sooner.       Influenza A by PCR NEGATIVE NEGATIVE Final   Influenza B by PCR NEGATIVE  NEGATIVE Final    Comment: (NOTE) The Xpert Xpress SARS-CoV-2/FLU/RSV plus assay is intended as an aid in the diagnosis of influenza from Nasopharyngeal swab specimens and should not be used as a sole basis for treatment. Nasal washings and aspirates are unacceptable for Xpert Xpress SARS-CoV-2/FLU/RSV testing.  Fact Sheet for Patients: EntrepreneurPulse.com.au  Fact Sheet for Healthcare Providers: IncredibleEmployment.be  This test is not yet approved or cleared by the [place] FDA and has been authorized for detection and/or diagnosis of SARS-CoV-2 by FDA under an Emergency Use Authorization (EUA). This EUA will remain in effect (meaning this test can be used) for the duration of the COVID-19 declaration under Section 564(b)(1) of the Act, 21 U.S.C. section 360bbb-3(b)(1), unless the authorization is terminated or revoked.  Performed at [facility], [place] [address] [place], [place] [address]      Radiological Exams on Admission: DG Chest Portable 1 View  Result Date: 12/08/2020 CLINICAL DATA:  hypotension, somnolence EXAM: PORTABLE CHEST 1 VIEW COMPARISON:  02/27/2020 FINDINGS: Heart and mediastinal contours are within normal limits. No focal opacities or effusions. No acute bony abnormality. IMPRESSION: No active disease. Electronically Signed   By: Rolm Baptise M.D.   On: 12/08/2020 18:10    EKG: Independently reviewed. Sinus rhythm.   Assessment/Plan   1. Shock  - Presents with headache and fatigue after HD and was found to have BP 60/39  - He has chronic hypotension and is prescribed midodrine but unable to afford, reports not eating/drinking today, and this may simply be related to that but he also has WBC 14k, drop in Hgb despite hypovolemia, and hx or MRSA bacteremia  - There is no meningismus, abdominal exam benign, does not urinate, no respiratory s/s, no apparent cellulitis or rash, no overt bleeding  - He  was given 2.5 liters IVF, was started on Levophed, given 10 mg midodrine, and now off Levophed  - Plan to check FOBT, blood cultures, and procalcitonin, cover with broad-spectrum antibiotics for now, trend H&H, continue midodrine   ADDENDUM: Patient was off Levophed for approximately 1 hr before systolic pressures returned to 60s and 70s. He does not urinate and has already had 2.5 liters  IVF and midodrine. Levophed was resumed via PIV. Discussed the situation with Dr. Ayesha Rumpf of critical care again who accepts patient to ICU at [facility], appreciate his assistance.    2. ESRD  - Completed HD 12/08/20 - Received 2.5 liters IVF in ED  - Renally-dose medications, restrict fluids, monitor chemistries and volume status    3. Insulin-dependent DM  - A1c was 6.9% one yr ago  - Continue CBG checks and insulin    4. HIV  - CD4 363 and VL <20 in March 2022  - Continue Biktarvy    5. Pressure ulcers  - Pressure ulcers at b/l BKA stumps do not appear acutely infected  - Wound care    6. Anemia  - Hgb 9.3 on admission, down from 11.7 in March  - No overt bleeding, check FOBT in light of undifferentiated hypotension, trend H&H    DVT prophylaxis: sq heparin  Code Status: Full  Level of Care: Level of care: Progressive Family Communication: None present   Disposition Plan:  Patient is from: home  Anticipated d/c is to: Home  Anticipated d/c date is: 12/12/20  Patient currently: pending cultures, stable BP  Consults called: None  Admission status: Inpatient   Video Translator: Mickel Baas [id number]   Vianne Bulls, MD [facility]  12/09/2020, 12:25 AM

## 2020-12-08 NOTE — ED Provider Notes (Signed)
Emergency Medicine Provider Triage Evaluation Note  Alvin Daniels , a 50 y.o. male  was evaluated in triage.  Pt complains of I was asked to see the patient urgently due to hypertension.  Patient has a past medical history of end-stage renal disease, AIDS defining illness, metabolic bone disease, status post bilateral BKA's, diabetes.  He is pending a renal transplant in 2 months in [place].  Patient had a full dialysis session this morning.  He states that his blood pressures are normally soft and his systolic pressure was about 90 this morning.  Upon arrival he was found to have a very soft blood pressure here.  He is not complaining of any symptoms of near syncope or chest pain.  He complains of pain in his neck and all the way down the back which is worse when he tries to sit up straight or turn his neck to the right.  He does have a headache but denies fever or visual changes.  He also has sharp pain in the right anterior chest wall with deep breathing.  Review of Systems  Positive: Neck  and back pain Negative: fever  Physical Exam  BP (!) 60/39 (BP Location: Right Arm)   Pulse 94   Temp 99 F (37.2 C) (Oral)   Resp 16   SpO2 95%  Gen:   Awake, no distress , appears chronically ill Resp:  Normal effort  MSK:   Moves extremities without difficulty  Other:  Large ulcerated nodule on the patient's scalp, bilateral ulcers over the head of the fibula suggestive of ill fitting prosthesis.  Tenderness to palpation along the cervical thoracic and lumbar paraspinals.  Audible thrill in left upper extremity dialysis access site. Medical Decision Making  Medically screening exam initiated at 2:36 PM.  Appropriate orders placed.  Alvin Daniels was informed that the remainder of the evaluation will be completed by another provider, this initial triage assessment does not replace that evaluation, and the importance of remaining in the ED until their evaluation is  complete.  Patient here with hypotension.  His complaint is of pain in his back neck and chest.  I have ordered basic labs, a small fluid bolus as the patient is end-stage renal disease.  He has been reasonably medically screened for oncoming provider.   Alvin Mail, PA-C 12/08/20 1443    Alvin Dusky, MD 12/08/20 [phone]

## 2020-12-09 DIAGNOSIS — R579 Shock, unspecified: Secondary | ICD-10-CM

## 2020-12-09 LAB — CBC
HCT: 31 % — ABNORMAL LOW (ref 39.0–52.0)
Hemoglobin: 10.3 g/dL — ABNORMAL LOW (ref 13.0–17.0)
MCH: 33.6 pg (ref 26.0–34.0)
MCHC: 33.2 g/dL (ref 30.0–36.0)
MCV: 101 fL — ABNORMAL HIGH (ref 80.0–100.0)
Platelets: 261 10*3/uL (ref 150–400)
RBC: 3.07 MIL/uL — ABNORMAL LOW (ref 4.22–5.81)
RDW: 14.5 % (ref 11.5–15.5)
WBC: 13.1 10*3/uL — ABNORMAL HIGH (ref 4.0–10.5)
nRBC: 0 % (ref 0.0–0.2)

## 2020-12-09 LAB — BASIC METABOLIC PANEL
Anion gap: 12 (ref 5–15)
BUN: 23 mg/dL — ABNORMAL HIGH (ref 6–20)
CO2: 30 mmol/L (ref 22–32)
Calcium: 8.5 mg/dL — ABNORMAL LOW (ref 8.9–10.3)
Chloride: 96 mmol/L — ABNORMAL LOW (ref 98–111)
Creatinine, Ser: 6.19 mg/dL — ABNORMAL HIGH (ref 0.61–1.24)
GFR, Estimated: 10 mL/min — ABNORMAL LOW (ref >60)
Glucose, Bld: 104 mg/dL — ABNORMAL HIGH (ref 70–99)
Potassium: 3.9 mmol/L (ref 3.5–5.1)
Sodium: 138 mmol/L (ref 135–145)

## 2020-12-09 LAB — PHOSPHORUS: Phosphorus: 4.4 mg/dL (ref 2.5–4.6)

## 2020-12-09 LAB — TYPE AND SCREEN
ABO/RH(D): O POS
Antibody Screen: NEGATIVE

## 2020-12-09 LAB — GLUCOSE, CAPILLARY: Glucose-Capillary: 104 mg/dL — ABNORMAL HIGH (ref 70–99)

## 2020-12-09 LAB — CBG MONITORING, ED: Glucose-Capillary: 69 mg/dL — ABNORMAL LOW (ref 70–99)

## 2020-12-09 LAB — MAGNESIUM: Magnesium: 2.4 mg/dL (ref 1.7–2.4)

## 2020-12-09 MED ORDER — VANCOMYCIN HCL IN DEXTROSE 500-5 MG/100ML-% IV SOLN: 500.0000 mg | INTRAVENOUS | Status: DC

## 2020-12-09 MED ORDER — SODIUM CHLORIDE 0.9 % IV SOLN
1.0000 g | INTRAVENOUS | Status: DC
  Filled 2020-12-09: qty 1

## 2020-12-09 MED ORDER — VANCOMYCIN VARIABLE DOSE PER UNSTABLE RENAL FUNCTION (PHARMACIST DOSING): Status: DC

## 2020-12-09 MED ORDER — NOREPINEPHRINE 4 MG/250ML-% IV SOLN
2.0000 ug/min | INTRAVENOUS | Status: DC
  Administered 2020-12-09: 2 ug/min via INTRAVENOUS

## 2020-12-09 MED ORDER — SODIUM CHLORIDE 0.9 % IV SOLN: 250.0000 mL | INTRAVENOUS | Status: DC

## 2020-12-09 MED ORDER — CHLORHEXIDINE GLUCONATE CLOTH 2 % EX PADS
6.0000 | MEDICATED_PAD | Freq: Every day | CUTANEOUS | Status: DC
  Administered 2020-12-09: 6 via TOPICAL

## 2020-12-09 MED ORDER — SODIUM CHLORIDE 0.9 % IV BOLUS
1000.0000 mL | Freq: Once | INTRAVENOUS | Status: AC
  Administered 2020-12-09: 1000 mL via INTRAVENOUS

## 2020-12-09 MED ORDER — CHLORHEXIDINE GLUCONATE CLOTH 2 % EX PADS: 6.0000 | MEDICATED_PAD | Freq: Every day | CUTANEOUS | Status: DC

## 2020-12-09 MED ORDER — VANCOMYCIN HCL IN DEXTROSE 1-5 GM/200ML-% IV SOLN
1000.0000 mg | Freq: Once | INTRAVENOUS | Status: DC
  Filled 2020-12-09: qty 200

## 2020-12-09 MED ORDER — SODIUM CHLORIDE 0.9 % IV SOLN: 2.0000 g | INTRAVENOUS | Status: DC

## 2020-12-09 NOTE — Progress Notes (Addendum)
Patient was seen this morning and events overnight reviewed.  He is a 50 year old Hispanic male with a past medical history significant for but not limited to ESRD on hemodialysis, Monday Wednesday Friday who was last dialyzed yesterday, history of HIV and AIDS, insulin-dependent diabetes mellitus type 2, chronic hypotension, history of bilateral BKA's, diabetic nephropathy, history of recent MRSA infection and bacteremia a year ago, severe protein calorie malnutrition, as well as other comorbidities who was admitted for shock and persistent hypotension.  He was found to have a blood pressure of 60/39 and presented with fatigue and headache after hemodialysis.  He has been on midodrine in the past but has not been compliant with her given affordability. Patient was given 2 and half liter boluses and placed on Levophed given his Hypotension.  Levophed was attempted to be weaned however he would immediately drop his blood pressures. Critical care was consulted for further evaluation and felt that his BP's are chronically low so they opted to discontinue Levophed and was given another 1 L bolus. Care was transferred to Dr. Melvyn Novas as patient was persistently hypotensive with systolics in the Q000111Q and diastolics in the Q000111Q to [id number]. Because of his persistent hypotension critical care Dr. Lake Bells will be taking over for observation in the ICU at [facility].  Appreciate the Critical Care Team help and TRH we will sign off at this time and pick the patient up back onto our service once he is stabilized.

## 2020-12-09 NOTE — ED Notes (Signed)
ED TO INPATIENT HANDOFF REPORT  Name/Age/Gender Alvin Daniels 50 y.o. male  Code Status    Code Status Orders  (From admission, onward)         Start     Ordered   12/08/20 2347  Full code  Continuous        12/08/20 2349        Code Status History    Date Active Date Inactive Code Status Order ID Comments User Context   02/28/2020 2257 03/02/2020 1721 Full Code [id number]  Audria Nine, DO ED   12/04/2019 2035 12/11/2019 0044 Full Code [id number]  Etta Quill, DO ED   12/02/2019 1710 12/03/2019 2203 Full Code [id number]  Harold Hedge, MD ED   09/28/2019 0146 10/02/2019 1805 Full Code [id number]  Rise Patience, MD ED   08/30/2018 0007 09/07/2018 2024 Full Code [id number]  Etta Quill, DO ED   06/17/2018 0819 06/23/2018 2026 Full Code [id number]  Caren Griffins, MD ED   08/16/2016 0135 08/22/2016 1907 Full Code [id number]  Ivor Costa, MD ED   05/24/2016 0512 05/25/2016 1630 Full Code [id number]  Ivor Costa, MD ED   05/08/2016 1339 05/10/2016 2137 Full Code [id number]  Tonette Bihari, MD Inpatient   04/02/2016 0304 04/04/2016 1800 Full Code [id number]  Edwin Dada, MD Inpatient   03/03/2016 1734 03/07/2016 1828 Full Code [id number]  Brand Males, MD ED   06/17/2014 1819 06/20/2014 1602 Full Code [id number]  Newt Minion, MD Inpatient   05/02/2014 2023 05/04/2014 0049 Full Code [id number]  Newt Minion, MD Inpatient   04/18/2014 1626 04/24/2014 1643 Full Code [id number]  Leone Haven, MD Inpatient   04/15/2013 1319 04/22/2013 1920 Full Code [id number]  Modena Jansky, MD Inpatient   06/26/2012 0306 06/27/2012 2025 Full Code [id number]  Reyne Dumas, MD Inpatient   06/02/2012 2347 06/03/2012 1804 Full Code [id number]  Soyla Dryer, RN Inpatient      Home/SNF/Other Home  Chief Complaint Shock [facility]) [R57.9]  Level of Care/Admitting Diagnosis ED Disposition    ED Disposition  Admit   Condition  --   Comment  Hospital Area: [place] [[id number]]  Level of Care: ICU [6]  May admit patient to [facility] or [facility] if equivalent level of care is available:: No  Covid Evaluation: Confirmed COVID Negative  Diagnosis: Shock [facility]) [id number]  Admitting Physician: Vianne Bulls [id number]  Attending Physician: Vianne Bulls [id number]  Estimated length of stay: past midnight tomorrow  Certification:: I certify this patient will need inpatient services for at least 2 midnights  Bed request comments: admit to ICU per Dr. Ayesha Rumpf PCCM verbal order         Medical History Past Medical History:  Diagnosis Date  . AIDS ([place]) 11/22/2014  . Back pain 06/09/2019  . BKA stump complication ([place]) [id number]  . Chronic diarrhea   . Chronic hepatitis C without hepatic coma ([place]) 11/22/2014  . 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 Diabetic neuropathy ([place])   . ESRD (end stage renal disease) on dialysis [facility])    "TTS; don't remember street name" (05/03/2014)  . Hepatitis C   . HIV INFECTION 06/27/2010   Qualifier: Diagnosis of  By: Nickola Major CMA ( [place]), Geni Bers    . Hypotension 06/02/2012  . Metabolic bone disease [id number]  . MRSA infection   . Normocytic anemia 06/17/2012  . Pancreatitis   . Pressure ulcer of BKA stump, stage  2 ([place]) 11/22/2014  . Renal disorder   . Renal insufficiency   . Severe protein-calorie malnutrition ([place]) 06/17/2012  . Uncontrolled diabetes mellitus with complications ([place]) 0000000   Annotation: uncontrolled Qualifier: Diagnosis of  By: Nickola Major CMA ( AAMA), Jacqueline      Allergies No Known Allergies  IV Location/Drains/Wounds Patient Lines/Drains/Airways Status    Active Line/Drains/Airways    Name Placement date Placement time Site Days   Peripheral IV 12/08/20 20 G Right Antecubital 12/08/20  1434  Antecubital  1   Fistula / Graft Left Forearm Arteriovenous fistula --  --  Forearm  --   Fistula / Graft Left Upper arm Arteriovenous fistula --  --  Upper arm  --    Fistula / Graft Left Forearm Arteriovenous fistula --  --  Forearm  --   Fistula / Graft Left Forearm Arteriovenous fistula 05/10/16  1343  Forearm  1674   Fistula / Graft Left Upper arm Arteriovenous fistula --  --  Upper arm  --   Wound / Incision (Open or Dehisced) 12/02/19 Incision - Open Thigh Anterior;Distal;Right cellulitis 12/02/19  2250  Thigh  373          Labs/Imaging Results for orders placed or performed during the hospital encounter of 12/08/20 (from the past 48 hour(s))  Comprehensive metabolic panel     Status: Abnormal   Collection Time: 12/08/20  2:34 PM  Result Value Ref Range   Sodium 140 135 - 145 mmol/L   Potassium 3.9 3.5 - 5.1 mmol/L   Chloride 93 (L) 98 - 111 mmol/L   CO2 33 (H) 22 - 32 mmol/L   Glucose, Bld 57 (L) 70 - 99 mg/dL    Comment: Glucose reference range applies only to samples taken after fasting for at least 8 hours.   BUN 13 6 - 20 mg/dL   Creatinine, Ser 3.99 (H) 0.61 - 1.24 mg/dL   Calcium 8.6 (L) 8.9 - 10.3 mg/dL   Total Protein 9.4 (H) 6.5 - 8.1 g/dL   Albumin 3.7 3.5 - 5.0 g/dL   AST 36 15 - 41 U/L   ALT 11 0 - 44 U/L   Alkaline Phosphatase 74 38 - 126 U/L   Total Bilirubin 1.4 (H) 0.3 - 1.2 mg/dL   GFR, Estimated 17 (L) >60 mL/min    Comment: (NOTE) Calculated using the CKD-EPI Creatinine Equation (2021)    Anion gap 14 5 - 15    Comment: Performed at [facility], [place] [address], [place], [place] [address]  CBC with Differential     Status: Abnormal   Collection Time: 12/08/20  2:34 PM  Result Value Ref Range   WBC 14.0 (H) 4.0 - 10.5 K/uL   RBC 2.77 (L) 4.22 - 5.81 MIL/uL   Hemoglobin 9.3 (L) 13.0 - 17.0 g/dL   HCT 27.8 (L) 39.0 - 52.0 %   MCV 100.4 (H) 80.0 - 100.0 fL   MCH 33.6 26.0 - 34.0 pg   MCHC 33.5 30.0 - 36.0 g/dL   RDW 14.4 11.5 - 15.5 %   Platelets 235 150 - 400 K/uL   nRBC 0.0 0.0 - 0.2 %   Neutrophils Relative % 88 %   Neutro Abs 12.2 (H) 1.7 - 7.7 K/uL   Lymphocytes Relative 5 %   Lymphs  Abs 0.7 0.7 - 4.0 K/uL   Monocytes Relative 7 %   Monocytes Absolute 0.9 0.1 - 1.0 K/uL   Eosinophils Relative 0 %   Eosinophils  Absolute 0.1 0.0 - 0.5 K/uL   Basophils Relative 0 %   Basophils Absolute 0.1 0.0 - 0.1 K/uL   Immature Granulocytes 0 %   Abs Immature Granulocytes 0.06 0.00 - 0.07 K/uL    Comment: Performed at [facility], [place] [address], [place], [place] [address]  Resp Panel by RT-PCR (Flu A&B, Covid) Nasopharyngeal Swab     Status: None   Collection Time: 12/08/20  2:45 PM   Specimen: Nasopharyngeal Swab; Nasopharyngeal(NP) swabs in vial transport medium  Result Value Ref Range   SARS Coronavirus 2 by RT PCR NEGATIVE NEGATIVE    Comment: (NOTE) SARS-CoV-2 target nucleic acids are NOT DETECTED.  The SARS-CoV-2 RNA is generally detectable in upper respiratory specimens during the acute phase of infection. The lowest concentration of SARS-CoV-2 viral copies this assay can detect is 138 copies/mL. A negative result does not preclude SARS-Cov-2 infection and should not be used as the sole basis for treatment or other patient management decisions. A negative result may occur with  improper specimen collection/handling, submission of specimen other than nasopharyngeal swab, presence of viral mutation(s) within the areas targeted by this assay, and inadequate number of viral copies(<138 copies/mL). A negative result must be combined with clinical observations, patient history, and epidemiological information. The expected result is Negative.  Fact Sheet for Patients:  EntrepreneurPulse.com.au  Fact Sheet for Healthcare Providers:  IncredibleEmployment.be  This test is no t yet approved or cleared by the [place] FDA and  has been authorized for detection and/or diagnosis of SARS-CoV-2 by FDA under an Emergency Use Authorization (EUA). This EUA will remain  in effect (meaning this test can be used) for the  duration of the COVID-19 declaration under Section 564(b)(1) of the Act, 21 U.S.C.section 360bbb-3(b)(1), unless the authorization is terminated  or revoked sooner.       Influenza A by PCR NEGATIVE NEGATIVE   Influenza B by PCR NEGATIVE NEGATIVE    Comment: (NOTE) The Xpert Xpress SARS-CoV-2/FLU/RSV plus assay is intended as an aid in the diagnosis of influenza from Nasopharyngeal swab specimens and should not be used as a sole basis for treatment. Nasal washings and aspirates are unacceptable for Xpert Xpress SARS-CoV-2/FLU/RSV testing.  Fact Sheet for Patients: EntrepreneurPulse.com.au  Fact Sheet for Healthcare Providers: IncredibleEmployment.be  This test is not yet approved or cleared by the [place] FDA and has been authorized for detection and/or diagnosis of SARS-CoV-2 by FDA under an Emergency Use Authorization (EUA). This EUA will remain in effect (meaning this test can be used) for the duration of the COVID-19 declaration under Section 564(b)(1) of the Act, 21 U.S.C. section 360bbb-3(b)(1), unless the authorization is terminated or revoked.  Performed at [facility], [place] [address], [place], [place] [address]   Troponin I (High Sensitivity)     Status: None   Collection Time: 12/08/20  6:48 PM  Result Value Ref Range   Troponin I (High Sensitivity) 7 <18 ng/L    Comment: (NOTE) Elevated high sensitivity troponin I (hsTnI) values and significant  changes across serial measurements may suggest ACS but many other  chronic and acute conditions are known to elevate hsTnI results.  Refer to the "Links" section for chest pain algorithms and additional  guidance. Performed at [facility], [place] [address], [place], [place] [address]   Brain natriuretic peptide     Status: None   Collection Time: 12/08/20  6:48 PM  Result Value Ref Range   B Natriuretic  Peptide 63.1 0.0 - 100.0 pg/mL     Comment: Performed at [facility], [place] [address], [place], [place] [address]  POC CBG, ED     Status: Abnormal   Collection Time: 12/08/20  8:15 PM  Result Value Ref Range   Glucose-Capillary 112 (H) 70 - 99 mg/dL    Comment: Glucose reference range applies only to samples taken after fasting for at least 8 hours.  Troponin I (High Sensitivity)     Status: None   Collection Time: 12/08/20  8:57 PM  Result Value Ref Range   Troponin I (High Sensitivity) 9 <18 ng/L    Comment: (NOTE) Elevated high sensitivity troponin I (hsTnI) values and significant  changes across serial measurements may suggest ACS but many other  chronic and acute conditions are known to elevate hsTnI results.  Refer to the "Links" section for chest pain algorithms and additional  guidance. Performed at [facility], [place] [address] [place], [place] [address]   Lactic acid, plasma     Status: None   Collection Time: 12/08/20 10:36 PM  Result Value Ref Range   Lactic Acid, Venous 0.8 0.5 - 1.9 mmol/L    Comment: Performed at [facility], [place] [address] [place], [place] [address]  Type and screen     Status: None (Preliminary result)   Collection Time: 12/09/20  2:19 AM  Result Value Ref Range   ABO/RH(D) PENDING    Antibody Screen PENDING    Sample Expiration      12/12/2020,2359 Performed at [facility], [place] [address] [place], [place] [address]    DG Chest Portable 1 View  Result Date: 12/08/2020 CLINICAL DATA:  hypotension, somnolence EXAM: PORTABLE CHEST 1 VIEW COMPARISON:  02/27/2020 FINDINGS: Heart and mediastinal contours are within normal limits. No focal opacities or effusions. No acute bony abnormality. IMPRESSION: No active disease. Electronically Signed   By: Rolm Baptise M.D.   On: 12/08/2020 18:10    Pending Labs Unresulted Labs (From admission, onward)    Start     Ordered   12/09/20 0500  Hemoglobin A1c   Tomorrow morning,   R       Comments: To assess prior glycemic control    12/08/20 2349   12/09/20 [id number]  Basic metabolic panel  Daily,   R      12/08/20 2349   12/09/20 0500  CBC  Daily,   R      12/08/20 2349   12/09/20 0500  Phosphorus  Tomorrow morning,   R        12/08/20 2349   12/09/20 0500  Magnesium  Tomorrow morning,   R        12/08/20 2349   12/09/20 0123  Hemoglobin and hematocrit, blood  ONCE - STAT,   STAT        12/09/20 0123   12/08/20 2350  Procalcitonin - Baseline  ONCE - STAT,   STAT        12/08/20 2349   12/08/20 2349  Occult blood card to lab, stool RN will collect  Once,   STAT       Question:  Specimen to be collected by:  Answer:  RN will collect   12/08/20 2349   12/08/20 2346  Culture, blood (x 2)  BLOOD CULTURE X 2,   STAT     Comments: INITIATE ANTIBIOTICS WITHIN 1 HOUR AFTER BLOOD CULTURES DRAWN.  If unable to obtain blood cultures,  call MD immediately regarding antibiotic instructions.   Question:  Patient immune status  Answer:  Immunocompromised   12/08/20 2349   12/08/20 2208  Lactic acid, plasma  (Undifferentiated presentation (screening labs and basic nursing orders))  Now then every 2 hours,   STAT      12/08/20 2208          Vitals/Pain Today's Vitals   12/09/20 0230 12/09/20 0235 12/09/20 0300 12/09/20 0305  BP: 139/69 138/72 139/71 135/71  Pulse: 98 99 100 99  Resp: '14 13 14 14  '$ Temp:      TempSrc:      SpO2: 98% 97% 97% 97%  Weight:      Height:      PainSc:        Isolation Precautions No active isolations  Medications Medications  midodrine (PROAMATINE) tablet 10 mg (10 mg Oral Given 12/08/20 2238)  bictegravir-emtricitabine-tenofovir AF (BIKTARVY) 50-200-25 MG per tablet 1 tablet (has no administration in time range)  insulin aspart (novoLOG) injection 0-6 Units (has no administration in time range)  insulin detemir (LEVEMIR) injection 5 Units (5 Units Subcutaneous Given 12/09/20 0101)  heparin injection 5,000 Units (has  no administration in time range)  acetaminophen (TYLENOL) tablet 650 mg (has no administration in time range)    Or  acetaminophen (TYLENOL) suppository 650 mg (has no administration in time range)  metroNIDAZOLE (FLAGYL) IVPB 500 mg (has no administration in time range)  vancomycin (VANCOCIN) IVPB 1000 mg/200 mL premix (has no administration in time range)  0.9 %  sodium chloride infusion (Manually program via Guardrails IV Fluids) (has no administration in time range)  ceFEPIme (MAXIPIME) 2 g in sodium chloride 0.9 % 100 mL IVPB (has no administration in time range)  vancomycin (VANCOCIN) IVPB 500 mg/100 ml premix (has no administration in time range)  0.9 %  sodium chloride infusion (has no administration in time range)  norepinephrine (LEVOPHED) '4mg'$  in 243m premix infusion (2 mcg/min Intravenous New Bag/Given 12/09/20 0121)  sodium chloride 0.9 % bolus 250 mL (0 mLs Intravenous Stopped 12/08/20 1535)  acetaminophen (TYLENOL) tablet 1,000 mg (1,000 mg Oral Given 12/08/20 1528)  metoCLOPramide (REGLAN) injection 10 mg (10 mg Intravenous Given 12/08/20 1532)  diphenhydrAMINE (BENADRYL) injection 25 mg (25 mg Intravenous Given 12/08/20 1530)  lactated ringers bolus 250 mL (0 mLs Intravenous Stopped 12/08/20 1615)  lactated ringers bolus 1,000 mL (0 mLs Intravenous Stopped 12/08/20 1852)  ceFEPIme (MAXIPIME) 2 g in sodium chloride 0.9 % 100 mL IVPB (2 g Intravenous New Bag/Given 12/09/20 0105)    Mobility walks with device (bilateral amputee with personal prosthetics)

## 2020-12-09 NOTE — Discharge Instructions (Signed)
Vaya a hemodialysis el Lunes por su cita normal.

## 2020-12-09 NOTE — Progress Notes (Signed)
Patient ambulating up and down halls, Sp02 95%- no subjective or objective noted. Blood pressure taken prior after after ambulation- WDL.   MD notified.

## 2020-12-09 NOTE — ED Notes (Signed)
Report given to Shanon Brow, RN.  Carelink package printed and ready.

## 2020-12-09 NOTE — Discharge Summary (Signed)
Physician Discharge Summary         Patient ID: Alvin Daniels MRN: [id number] DOB/AGE: 1970-05-23 50 y.o.  Admit date: 12/08/2020 Discharge date: 12/09/2020  Discharge Diagnoses:    Neck pain> resolved Chronic hypotension ESRD  Discharge summary    This is a pleasant 50 y/o male admitted from [facility].  Yesterday after dialysis he had neck and back pain so he went to [facility] for evaluation.  There he was noted to be hypotensive, but denied chest pain or near syncope or dizziness. He was given fluids, but his blood pressure was low so levophed was started.  He was given fluid again (1 L NS) this morning and now his blood pressure is normal.  He has not required levophed here. He denies fever, chills cough, bleeding, nausea, vomiting, or other symptoms. He says that he stopped taking midodrine about 6 months ago. He says that he goes to dialysis 3 days a week where his blood pressure will be low, but he feels OK. He now says he feels completely normal.  Specifically he says he no longer has neck or back pain.  He notes that he will get neck and back pain from time to time after dialysis.  It has now resolved.  On physical exam in the [facility] ICU on 7/30 he had normal blood pressure on all readings without levophed.  He ambulated in our hallway on room air at a fast pace without difficulty.  He requested to go home.    Discharge Plan by Active Problems    Chronic hypotension: prescribe midodrine '10mg'$  po tid with meals, will ask transitions of care to help him with this ESRD: resume dialysis on MWF Neck pain, resolved: f/u with hemodialysis on Monday HIV: resume home medications  Significant Hospital tests/ studies  Lactic acid: normal Blood cultures: pending  Procedures    Culture data/antimicrobials   Blood cultures 7/29 >   Consults  PCCM    Discharge Exam: BP (!) 109/58   Pulse 89   Temp 97.9 F (36.6 C) (Oral)   Resp 13   Ht 5' (1.524 m)   Wt 52 kg   SpO2  98%   BMI 22.39 kg/m   General:  Resting comfortably in bed, no distress HENT: NCAT OP clear PULM: CTA B, normal effort CV: RRR, no mgr, left arm fistula normal in appearance GI: BS+, soft, nontender MSK: s/p bilateral BKA Neuro: awake, alert, no distress, MAEW  Labs at discharge   Lab Results  Component Value Date   CREATININE 6.19 (H) 12/09/2020   BUN 23 (H) 12/09/2020   NA 138 12/09/2020   K 3.9 12/09/2020   CL 96 (L) 12/09/2020   CO2 30 12/09/2020   Lab Results  Component Value Date   WBC 13.1 (H) 12/09/2020   HGB 10.3 (L) 12/09/2020   HCT 31.0 (L) 12/09/2020   MCV 101.0 (H) 12/09/2020   PLT 261 12/09/2020   Lab Results  Component Value Date   ALT 11 12/08/2020   AST 36 12/08/2020   ALKPHOS 74 12/08/2020   BILITOT 1.4 (H) 12/08/2020   Lab Results  Component Value Date   INR 1.2 02/28/2020   INR 1.1 09/27/2019   INR 1.27 08/16/2016    Current radiological studies    DG Chest Portable 1 View  Result Date: 12/08/2020 CLINICAL DATA:  hypotension, somnolence EXAM: PORTABLE CHEST 1 VIEW COMPARISON:  02/27/2020 FINDINGS: Heart and mediastinal contours are within normal limits. No focal opacities or effusions.  No acute bony abnormality. IMPRESSION: No active disease. Electronically Signed   By: Rolm Baptise M.D.   On: 12/08/2020 18:10    Disposition:     Discharge home Keep appointment with dialysis on Monday 8/1   Allergies as of 12/09/2020   No Known Allergies      Medication List     TAKE these medications    Biktarvy 50-200-25 MG Tabs tablet Generic drug: bictegravir-emtricitabine-tenofovir AF Take 1 tablet by mouth daily.   glucose blood test strip Commonly known as: True Metrix Blood Glucose Test 1 each by Other route 3 (three) times daily.   Insulin Syringe-Needle U-100 31G X 15/64" 0.5 ML Misc Commonly known as: BD Insulin Syringe Ultrafine 1 each by Does not apply route 2 (two) times daily.   midodrine 10 MG tablet Commonly known  as: PROAMATINE Take 1 tablet (10 mg total) by mouth 3 (three) times daily with meals.   NovoLIN 70/30 (70-30) 100 UNIT/ML injection Generic drug: insulin NPH-regular Human INJECT 15 UNITS INTO THE SKIN 2 (TWO) TIMES DAILY BEFORE A MEAL. What changed:  how much to take how to take this when to take this   True Metrix Meter Devi 1 each by Does not apply route 3 (three) times daily.   TRUEplus Lancets 28G Misc 1 each by Does not apply route 3 (three) times daily.         Follow-up appointment   Dialysis on 8/1  Discharge Condition:    good  Physician Statement:   > 30 minutes spend on this  Signed: Roselie Awkward 12/09/2020, 1:56 PM

## 2020-12-09 NOTE — Progress Notes (Signed)
Pharmacy Antibiotic Note  Alvin Daniels is a 50 y.o. male admitted on 12/08/2020 with with medical history significant for ESRD on hemodialysis MWF, HIV, insulin-dependent diabetes mellitus, and chronic hypertension, now presenting to the emergency department with headache and fatigue.  Pharmacy has been consulted for vancomycin and cefepime dosing.  Plan: Cefepime 2gm IV x 1 then 2gm after each HD session Vancomycin 1gm x 1 then '500mg'$  after each HD session  Height: 5' (152.4 cm) Weight: 52 kg (114 lb 10.2 oz) IBW/kg (Calculated) : 50  Temp (24hrs), Avg:99 F (37.2 C), Min:99 F (37.2 C), Max:99 F (37.2 C)  Recent Labs  Lab 12/08/20 1434 12/08/20 2236  WBC 14.0*  --   CREATININE 3.99*  --   LATICACIDVEN  --  0.8    Estimated Creatinine Clearance: 15.7 mL/min (A) (by C-G formula based on SCr of 3.99 mg/dL (H)).    No Known Allergies  Antimicrobials this admission: 7/30 vanc >> 7/30 cefepime >>  Dose adjustments this admission:   Microbiology results: 7/29 BCx:  7/29 UCx:    Thank you for allowing pharmacy to be a part of this patient's care.  Dolly Rias RPh 12/09/2020, 1:04 AM

## 2020-12-09 NOTE — ED Notes (Addendum)
The pt stated via interpreter that he would like to leave.  Attempted to explain that he is very sick and leaving could result in poor outcome. Secure chat sent to Dr. Alfredia Ferguson.  Pt is also in need of a central line as anytime levophed is stopped BP drops to [id number] systolic per off going RN.  Pt has not been able to receive any other IV medications ordered d/t inability to stop levophed.

## 2020-12-09 NOTE — Progress Notes (Signed)
Provided pt with a coupon for Midodrine for $32.49 for 45 days. He reports that he talked to his friend and she is going to pay for the medication.

## 2020-12-09 NOTE — Progress Notes (Addendum)
Received call from RN. Pt is ready to be D/C. He has been in the hospital multiple times, because he doesn't take his BP med. Met with pt. He reports that he lives with a family that are not related to him. He reports that he takes the bus to go to his HD appointments. He has a friend that takes him to his doctors appointments. He reports that he doesn't work and he can't afford his medications. He reports that he doesn't have any income and he doesn't have any financial support. He reports that he has a friend that was helping him with his meds, but she went back to [place]. He reports that he gets his Midodrine med through the mail, but reports that he hasn't been taking his BP meds, because he can't afford it. Pt is not sure about the name of the medication that he receives in the mail. He reports that he only takes 1 pill qd and he gets this medication in the mail. He reports that he can't get help with his meds because he doesn't have an orange card. He reports that Medicaid doesn't pay for his meds. Pt has emergency Medicaid. Provided pt with information about the [place] [facility]. Explained the importance of having a PCP and he needs to f/u with a physician after being D/C from the hospital. Encouraged pt to call the clinic on Monday morning and schedule an appointment and they can also assist him with his meds. He verbalized understanding. We are not able to assist pt with his prescriptions through the [facility] program, because we assisted him last year (12/10/19-12/18/19). Contacted Oretha Caprice, supervisor on call to see if she can override and we can assist pt with his prescription. Durene Romans authorized the override, but she is not able to do the override in [employer]. Leone Brand, CM, leadership on call. She reports that she is not able to override, we can only help the pt for 1 week, and he needs to talk to the SW at the HD center. Abigail Butts, CM contacted [facility] and they are going to  provide pt with a 3 day supply of Midodrine.

## 2020-12-09 NOTE — ED Notes (Signed)
Levo stopped per Dr. Melvyn Novas at [phone].

## 2020-12-09 NOTE — Progress Notes (Signed)
Patient received to unit without complications. Given CHG bath and placed on monitor- BP within normal range. Patient ambulating in room to bathroom without distress.   CM , [name], consulted for help with patient medication barriers.

## 2020-12-09 NOTE — Consult Note (Addendum)
NAME:  Alvin Daniels, MRN:  [id number], DOB:  [birth year], LOS: 1 ADMISSION DATE:  12/08/2020, CONSULTATION DATE:  7/30 REFERRING MD:  Triad, CHIEF COMPLAINT:  HA, positional neck pain/ low bp   History of Present Illness:  50 y.o. male with medical history significant for ESRD on hemodialysis MWF, HIV, insulin-dependent diabetes mellitus, and chronic hypertension,  presentd to the emergency department with headache and fatigue.  Patient reports that he developed a nodule on his scalp a few days ago that was causing some pain, but then developed more of a generalized headache yesterday that was mild, he did not eat or drink anything today, completed dialysis, and had worsening in his headache as well as fatigue after dialysis which prompted his presentation to the ED.  He denied any fevers, chills, cough, shortness of breath, abdominal pain, nausea, vomiting, or diarrhea.  He does not urinate.  He has been unable to afford midodrine.  Notably, he was admitted 1 year ago with MRSA bacteremia, was unable to undergo TEE due to low blood pressures, and was treated with 6 weeks of vancomycin with HD.   ED Course: Upon arrival to the ED, patient is found to be afebrile, saturating well on room air, and with blood pressure 60/39.  EKG features sinus rhythm and chest x-ray negative for acute cardiopulmonary disease.  Chemistry panel notable for bicarbonate 33, BUN 13, glucose 57.  CBC features a leukocytosis to 14,000 and hemoglobin 9.3.  Lactic acid, troponin, and BNP are normal.  COVID and influenza PCR negative.  Patient was given 2.5 L of IV fluids, Reglan, Benadryl, and acetaminophen in the ED.  He remained hypotensive and was started on Levophed.    Pertinent  Medical History  AIDS   Back pain  BKA stump complication   Chronic diarrhea Chronic hepatitis C without hepatic coma  Chronic osteomyelitis of foot  Diabetic neuropathy ESRD (end stage renal disease) on dialysis  Hypotension    Metabolic bone disease  MRSA infection Normocytic anemia Pancreatitis Pressure ulcer of BKA stump, stage 2 ([name])  Severe protein-calorie malnutrition ( Uncontrolled diabetes mellitus with complications   Significant Hospital Events: Including procedures, antibiotic start and stop dates in addition to other pertinent events     Interim History / Subjective:  On arrival talking not stop on cell phone and did not stop during my bedside evaluation, does not appear to be in any distress - thru interpreter answers all questions even yes/ no with several minute answers  very rapid speech frustrating the interpreter on the internet connection stating several times "everybody tells me something different" but verbalizes no symptoms at all and is hungry as hasn't eaten since 7/28 pm   Objective   Blood pressure 124/73, pulse 95, temperature 98.3 F (36.8 C), temperature source Oral, resp. rate (!) 21, height 5' (1.524 m), weight 52 kg, SpO2 100 %.        Intake/Output Summary (Last 24 hours) at 12/09/2020 0853 Last data filed at 12/09/2020 0740 Gross per 24 hour  Intake 103.72 ml  Output --  Net 103.72 ml   Filed Weights   12/08/20 1714  Weight: 52 kg    Examination: Tmax  Note at rest 02 sats  100% on RA  General appearance:    chronically ill hispanic male very verbose/ nad   HENT:  nickle sized ulceration top of scalp, mild induration/ no fluctuence or celllulitic changes around it  Lungs: clear to A and P  Cardiovascular: RRR note he  is not tachycardic  Abdomen: soft/ benign Extremities: s/p bilateral BKA with stump ulcerations Neuro: appears intact     I personally reviewed images and agree with radiology impression as follows:  CXR:   12/08/20 portable No active disease.  Assessment & Plan:  1)  acute on chronic hypotension with poor baroceptor responsiveness (note absence of tachycardia)  - no signs of sepsis or shock here. >> recurrent problem already rec midodrine  but can't afford it ? Needs case management help?   2) skin ulcer top of head will need f/u  - he says just present a few days but looks chronic to me.  Doubt source of sepsis, same for stump ulcerations  3) no evidence of sepsis or MI or PE   4) positional neck pain now completely resolved "never had it before" per interpreter.  5) ESRF s/p  HD 7/29 with nl electrolytes appears relatively dry so rec  >>> one more liter of NS, off levophed, watch clinically for mentation rather that treat a specific number, and recheck cbc along with cortisol level in 1 hour    ? need for ICU at this point, will check back p lunch ? D/c as observation status  >>> strongly rec triad have spanish speaking doctors attend if possible because working thru an interpreter was extremely challenging (for the interpreter) today.         Labs   CBC: Recent Labs  Lab 12/08/20 1434 12/09/20 0540  WBC 14.0* 13.1*  NEUTROABS 12.2*  --   HGB 9.3* 10.3*  HCT 27.8* 31.0*  MCV 100.4* 101.0*  PLT 235 0000000    Basic Metabolic Panel: Recent Labs  Lab 12/08/20 1434 12/09/20 0540  NA 140 138  K 3.9 3.9  CL 93* 96*  CO2 33* 30  GLUCOSE 57* 104*  BUN 13 23*  CREATININE 3.99* 6.19*  CALCIUM 8.6* 8.5*  MG  --  2.4  PHOS  --  4.4   GFR: Estimated Creatinine Clearance: 10.1 mL/min (A) (by C-G formula based on SCr of 6.19 mg/dL (H)). Recent Labs  Lab 12/08/20 1434 12/08/20 2236 12/09/20 0540  WBC 14.0*  --  13.1*  LATICACIDVEN  --  0.8  --     Liver Function Tests: Recent Labs  Lab 12/08/20 1434  AST 36  ALT 11  ALKPHOS 74  BILITOT 1.4*  PROT 9.4*  ALBUMIN 3.7   No results for input(s): LIPASE, AMYLASE in the last 168 hours. No results for input(s): AMMONIA in the last 168 hours.  ABG    Component Value Date/Time   PHART 7.460 (H) 03/03/2016 1439   PCO2ART 36.9 03/03/2016 1439   PO2ART 34.0 (LL) 03/03/2016 1439   HCO3 26.2 03/03/2016 1439   TCO2 30 05/28/2016 1414   TCO2 30  05/28/2016 1414   O2SAT 69.0 03/03/2016 1439     Coagulation Profile: No results for input(s): INR, PROTIME in the last 168 hours.  Cardiac Enzymes: No results for input(s): CKTOTAL, CKMB, CKMBINDEX, TROPONINI in the last 168 hours.  HbA1C: HbA1c, POC (controlled diabetic range)  Date/Time Value Ref Range Status  09/28/2018 02:21 PM 6.8 0.0 - 7.0 % Final   Hgb A1c MFr Bld  Date/Time Value Ref Range Status  12/02/2019 03:43 AM 6.9 (H) 4.8 - 5.6 % Final    Comment:    (NOTE) Pre diabetes:          5.7%-6.4%  Diabetes:              >  6.4%  Glycemic control for   <7.0% adults with diabetes   06/18/2018 06:47 AM 9.0 (H) 4.8 - 5.6 % Final    Comment:    (NOTE) Pre diabetes:          5.7%-6.4% Diabetes:              >6.4% Glycemic control for   <7.0% adults with diabetes     CBG: Recent Labs  Lab 12/08/20 2015  GLUCAP 112*    Review of Systems:     Past Medical History:  He,  has a past medical history of AIDS ([place]) (11/22/2014), Back pain (06/09/2019), BKA stump complication ([place]) ([id number]), Chronic diarrhea, Chronic hepatitis C without hepatic coma ([place]) (11/22/2014), 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[place]), Diabetic neuropathy ([place]), ESRD (end stage renal disease) on dialysis ([place]), Hepatitis C, HIV INFECTION (06/27/2010), Hypotension (123XX123), Metabolic bone disease ([id number]), MRSA infection, Normocytic anemia (06/17/2012), Pancreatitis, Pressure ulcer of BKA stump, stage 2 ([place]) (11/22/2014), Renal disorder, Renal insufficiency, Severe protein-calorie malnutrition ([place]) (06/17/2012), and Uncontrolled diabetes mellitus with complications ([place]) (0000000).   Surgical History:   Past Surgical History:  Procedure Laterality Date   AMPUTATION Left 04/20/2014   Procedure: 3rd toe amputation, 4th Toe Amputation,  5th Toe Amputation;  Surgeon: Newt Minion, MD;  Location: [facility];  Service: Orthopedics;  Laterality: Left;   AMPUTATION Left 05/02/2014   Procedure: Midfoot  Amputation;  Surgeon: Newt Minion, MD;  Location: [facility];  Service: Orthopedics;  Laterality: Left;   AMPUTATION Left 06/17/2014   Procedure: AMPUTATION BELOW KNEE;  Surgeon: Newt Minion, MD;  Location: [facility];  Service: Orthopedics;  Laterality: Left;   AMPUTATION Right 09/04/2018   Procedure: RIGHT BELOW KNEE AMPUTATION;  Surgeon: Newt Minion, MD;  Location: [facility];  Service: Orthopedics;  Laterality: Right;  RIGHT BELOW KNEE AMPUTATION   AV FISTULA PLACEMENT Left    AV FISTULA PLACEMENT Left 05/10/2016   Procedure: Creation Left Arm Brachiocephalic Arteriovenous Fistula and Ligation of Radiocephalic Fistula;  Surgeon: Angelia Mould, MD;  Location: [facility];  Service: Vascular;  Laterality: Left;   FEMUR IM NAIL Left 08/17/2016   Procedure: INTRAMEDULLARY (IM) RETROGRADE FEMORAL NAILING;  Surgeon: Marybelle Killings, MD;  Location: [facility];  Service: Orthopedics;  Laterality: Left;   FOOT AMPUTATION THROUGH ANKLE Left 12/'21/2015   midfoot   IR GENERIC HISTORICAL Left 05/01/2016   IR THROMBECTOMY AV FISTULA W/THROMBOLYSIS/PTA INC/SHUNT/IMG LEFT 05/01/2016 [name], MD [facility]   IR GENERIC HISTORICAL  05/01/2016   IR US GUIDE VASC ACCESS LEFT 05/01/2016 [name], MD [facility]   IR GENERIC HISTORICAL  05/07/2016   IR FLUORO GUIDE CV LINE RIGHT 05/07/2016 Corrie Mckusick, DO [facility]   IR GENERIC HISTORICAL  05/07/2016   IR US GUIDE VASC ACCESS RIGHT 05/07/2016 Corrie Mckusick, DO [facility]   IR GENERIC HISTORICAL  05/22/2016   IR US GUIDE VASC ACCESS RIGHT 05/22/2016 Greggory Keen, MD [facility]   IR GENERIC HISTORICAL  05/22/2016   IR FLUORO GUIDE CV LINE RIGHT 05/22/2016 Greggory Keen, MD [facility]   IR REMOVAL TUN CV CATH W/O [facility]  08/21/2016   PERIPHERAL VASCULAR CATHETERIZATION Left 05/09/2016   Procedure: A/V Fistulagram;  Surgeon: Angelia Mould, MD;  Location: [facility];  Service: Cardiovascular;  Laterality: Left;  arm   TEE WITHOUT  CARDIOVERSION N/A 06/22/2018   Procedure: TRANSESOPHAGEAL ECHOCARDIOGRAM (TEE);  Surgeon: Jerline Pain, MD;  Location: [facility] ENDOSCOPY;  Service: Cardiovascular;  Laterality: N/A;     Social History:   reports that he has never smoked. He has never used smokeless tobacco. He reports that he does not drink alcohol and does not use drugs.   Family History:  His family history includes Diabetes in his father and mother.   Allergies No Known Allergies   Home Medications  Prior to Admission medications   Medication Sig Start Date End Date Taking? Authorizing Provider  bictegravir-emtricitabine-tenofovir AF (BIKTARVY) 50-200-25 MG TABS tablet Take 1 tablet by mouth daily. 08/24/20  Yes Tommy Medal, Lavell Islam, MD  insulin NPH-regular Human (70-30) 100 UNIT/ML injection INJECT 15 UNITS INTO THE SKIN 2 (TWO) TIMES DAILY BEFORE A MEAL. Patient taking differently: Inject 12 Units into the skin 2 (two) times daily before a meal. 02/10/20 02/09/21 Yes Newlin, Enobong, MD  Blood Glucose Monitoring Suppl (TRUE METRIX METER) DEVI 1 each by Does not apply route 3 (three) times daily. 09/15/18   [name], MD  glucose blood (TRUE METRIX BLOOD GLUCOSE TEST) test strip 1 each by Other route 3 (three) times daily. 09/15/18   [name], MD  Insulin Syringe-Needle U-100 (BD INSULIN SYRINGE ULTRAFINE) 31G X 15/64" 0.5 ML MISC 1 each by Does not apply route 2 (two) times daily. 02/10/20   [name], MD  midodrine (PROAMATINE) 10 MG tablet Take 1 tablet (10 mg total) by mouth 3 (three) times daily with meals. Patient not taking: No sig reported 12/10/19   Regalado, Belkys A, MD  TRUEplus Lancets 28G MISC 1 each by Does not apply route 3 (three) times daily. 09/15/18   [name], MD       The patient was reported to be in shock and requires high complexity decision making for assessment and support, frequent evaluation and titration of therapies, application of advanced monitoring technologies and extensive  interpretation of multiple databases. Critical Care Time devoted to patient care services described in this note is 60 minutes.   Christinia Gully, MD Pulmonary and [place] Cell [phone]   After 7:00 pm call Elink  [phone]    Christinia Gully, MD Pulmonary and [place] [phone]   After 7:00 pm call Elink  [phone]

## 2020-12-09 NOTE — Progress Notes (Signed)
[facility] PCCM  S: Admitted from [facility]. Yesterday after dialysis he had neck and back pain so he went to [facility] for evaluation.  There he was noted to be hypotensive, but denied chest pain or near syncope or dizziness. He was given fluids, but his blood pressure was low so levophed was started.  He was given fluid again (1 L NS) this morning and now his blood pressure is normal.  He has not required levophed here. He denies fever, chills cough, bleeding, nausea, vomiting, or other symptoms. He says that he stopped taking midodrine about 6 months ago. He says that he goes to dialysis 3 days a week where his blood pressure will be low, but he feels OK. He now says he feels completely normal.  Specifically he says he no longer has neck or back pain.  He notes that he will get neck and back pain from time to time after dialysis.  It has now resolved.  He now wants to go home.   We asked the nephrology team to check his records from dialysis, there he has blood pressures on his last three visits of 64/35, 65/40 and 63/35.  Past Medical History:  Diagnosis Date   AIDS ([place]) 11/22/2014   Back pain 06/09/2019   BKA stump complication ([place]) [id number]   Chronic diarrhea    Chronic hepatitis C without hepatic coma ([place]) 11/22/2014   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[place])    Right   Diabetic neuropathy ([place])    ESRD (end stage renal disease) on dialysis ([place])    "TTS; don't remember street name" (05/03/2014)   Hepatitis C    HIV INFECTION 06/27/2010   Qualifier: Diagnosis of  By: Nickola Major CMA ( AAMA), Jacqueline     Hypotension [id number]   Metabolic bone disease [id number]   MRSA infection    Normocytic anemia 06/17/2012   Pancreatitis    Pressure ulcer of BKA stump, stage 2 ([place]) 11/22/2014   Renal disorder    Renal insufficiency    Severe protein-calorie malnutrition ([place]) 06/17/2012   Uncontrolled diabetes mellitus with complications ([place]) 0000000   Annotation: uncontrolled Qualifier: Diagnosis of  By:  Nickola Major CMA ( AAMA), Jacqueline     Vitals:   12/09/20 1045 12/09/20 1100 12/09/20 1300 12/09/20 1301  BP: (!) 87/38 (!) 100/50 106/67   Pulse: 87 88 89 89  Resp: (!) '7 15 13 13  '$ Temp:   97.9 F (36.6 C)   TempSrc:   Oral   SpO2: 97% 98% 98% 98%  Weight:      Height:       General:  Resting comfortably in bed, no distress HENT: NCAT OP clear PULM: CTA B, normal effort CV: RRR, no mgr, left arm fistula normal in appearance GI: BS+, soft, nontender MSK: s/p bilateral BKA Neuro: awake, alert, no distress, MAEW  CBC    Component Value Date/Time   WBC 13.1 (H) 12/09/2020 0540   RBC 3.07 (L) 12/09/2020 0540   HGB 10.3 (L) 12/09/2020 0540   HCT 31.0 (L) 12/09/2020 0540   PLT 261 12/09/2020 0540   MCV 101.0 (H) 12/09/2020 0540   MCH 33.6 12/09/2020 0540   MCHC 33.2 12/09/2020 0540   RDW 14.5 12/09/2020 0540   LYMPHSABS 0.7 12/08/2020 1434   MONOABS 0.9 12/08/2020 1434   EOSABS 0.1 12/08/2020 1434   BASOSABS 0.1 12/08/2020 1434   BMET    Component Value Date/Time   NA 138 12/09/2020 0540   K 3.9 12/09/2020 0540  CL 96 (L) 12/09/2020 0540   CO2 30 12/09/2020 0540   GLUCOSE 104 (H) 12/09/2020 0540   BUN 23 (H) 12/09/2020 0540   CREATININE 6.19 (H) 12/09/2020 0540   CREATININE 6.19 (H) 05/15/2017 1531   CALCIUM 8.5 (L) 12/09/2020 0540   CALCIUM 8.5 03/12/2010 1250   GFRNONAA 10 (L) 12/09/2020 0540   GFRNONAA 10 (L) 05/15/2017 1531   GFRAA 6 (L) 12/10/2019 1502   GFRAA 11 (L) 05/15/2017 1531   CXR personally reviewed: normal, no infiltrate  Lactic acid normal  Impression: Chronic hypotension Inability to obtain midodrine No evidence of sepsis No evidence of bleeding No evidence of other life threatening illness ESRD Bilateral amputee Transient neck and back pain> resolved  Plan: Ambulate in hallway  If able to ambulate, d/c home and resume dialysis on Monday Will work with social work to get financial assistance for midodrine APAP prn neck pain (now  resolved)  Roselie Awkward, MD [place] PCCM Pager: [phone] Cell: [phone] After 7:00 pm call Elink  [phone]

## 2020-12-11 LAB — HEMOGLOBIN A1C
Hgb A1c MFr Bld: 8.2 % — ABNORMAL HIGH (ref 4.8–5.6)
Mean Plasma Glucose: 189 mg/dL

## 2020-12-13 ENCOUNTER — Ambulatory Visit: Payer: Self-pay | Admitting: Infectious Disease

## 2020-12-14 LAB — CULTURE, BLOOD (ROUTINE X 2)
Culture: NO GROWTH
Culture: NO GROWTH
Special Requests: ADEQUATE

## 2021-01-03 ENCOUNTER — Ambulatory Visit (INDEPENDENT_AMBULATORY_CARE_PROVIDER_SITE_OTHER): Payer: Self-pay | Admitting: Infectious Disease

## 2021-01-03 ENCOUNTER — Other Ambulatory Visit: Payer: Self-pay

## 2021-01-03 ENCOUNTER — Encounter: Payer: Self-pay | Admitting: Infectious Disease

## 2021-01-03 VITALS — BP 77/47 | HR 97 | Temp 98.4°F | Wt 113.8 lb

## 2021-01-03 DIAGNOSIS — T879 Unspecified complications of amputation stump: Secondary | ICD-10-CM

## 2021-01-03 DIAGNOSIS — I9589 Other hypotension: Secondary | ICD-10-CM

## 2021-01-03 DIAGNOSIS — Z8614 Personal history of Methicillin resistant Staphylococcus aureus infection: Secondary | ICD-10-CM

## 2021-01-03 DIAGNOSIS — M545 Low back pain, unspecified: Secondary | ICD-10-CM

## 2021-01-03 DIAGNOSIS — E1122 Type 2 diabetes mellitus with diabetic chronic kidney disease: Secondary | ICD-10-CM

## 2021-01-03 DIAGNOSIS — B20 Human immunodeficiency virus [HIV] disease: Secondary | ICD-10-CM

## 2021-01-03 DIAGNOSIS — Z992 Dependence on renal dialysis: Secondary | ICD-10-CM

## 2021-01-03 DIAGNOSIS — R7881 Bacteremia: Secondary | ICD-10-CM

## 2021-01-03 DIAGNOSIS — T8740 Infection of amputation stump, unspecified extremity: Secondary | ICD-10-CM

## 2021-01-03 DIAGNOSIS — N186 End stage renal disease: Secondary | ICD-10-CM

## 2021-01-03 HISTORY — DX: Low back pain, unspecified: M54.50

## 2021-01-03 MED ORDER — BIKTARVY 50-200-25 MG PO TABS: 1.0000 | ORAL_TABLET | Freq: Every day | ORAL | 11 refills | Status: DC

## 2021-01-03 NOTE — Progress Notes (Signed)
Subjective:  Chief complaint: Low back pain fatigue and problems with low blood pressure as well as new skin breakdown bilaterally on his BKA sites   Patient ID: Alvin Daniels, male    DOB: Feb 09, 1971, 50 y.o.   MRN: [id number]  HPI   Note care provided with in person Spanish translator  50year old Hispanic man living with HIV that has been perfectly controlled on Biktarvy. He has had bilateral BKA'S with problems tin July 2021 with MRSA bacteremia due to stump infection that was debrided. He could not get TEE due to low blood pressure.   Is continue to have trouble with chronic hypotension and inability to afford midodrine.  Unfortunately Medicare for him is always a indefinite temporary partial coverage option that only covers his dialysis and does not cover any of his medications.  He was admitted to hospital in July 2022 with severe low back pain and hypotension that required fluids and Levophed.  He was being transferred to [facility] for further treatment when apparently his blood pressure was now in the normal range.  He was then discharged to [facility] with midodrine 10 mg 3 times a day with meals.  He was able to get this medication with a coupon for less than the $300 that it was going to cost him but not with an inconsequential cost to him.  He tells me that his nephrologist has been telling him only to use the midodrine on hemodialysis days which I do not think is correct he should take it all the time as far as I am concerned.  Indicates he came to clinic today for routine visit.  He is complaining of profound fatigue at times with dialysis and says on Monday he had a hard time getting through all of hemodialysis.  He also is again having low back pain that was more severe on Monday during dialysis than it has been in the last 2 days.  He had hemodialysis today and apparently according to him had a normal blood pressure at the end of dialysis.  Today in the clinic  his blood pressure is in the 70s over 40s.  He also again has low back pain.  He shows me also areas of concern on his bilateral B KA sites where he has skin breakdown due to improperly fitting prosthetic limbs.  He tells me that he was sent to a place to get new prostheses but he could not afford the price of these prostheses again because they are undoubtedly not covered by any insurance or Medicare because he did not provided with this.  Attempted to have him admitted directly to the hospital as he does not appear systemically unwell despite his blood pressure but understandably the hospitalist felt to be safer for him to go through the emergency department.      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[place])    Right   Diabetic neuropathy ([place])    ESRD (end stage renal disease) on dialysis ([place])    "TTS; don't remember street name" (05/03/2014)   Hepatitis C    HIV INFECTION 06/27/2010   Qualifier: Diagnosis of  By: Nickola Major CMA ( AAMA), Jacqueline     Hypotension 06/02/2012  Metabolic bone disease [id number]   MRSA infection    Normocytic anemia 06/17/2012   Pancreatitis    Pressure ulcer of BKA stump, stage 2 ([place]) 11/22/2014   Renal disorder    Renal insufficiency    Severe protein-calorie malnutrition ([place]) 06/17/2012   Uncontrolled diabetes mellitus with complications ([place]) 0000000   Annotation: uncontrolled Qualifier: Diagnosis of  By: Nickola Major CMA Deborra Medina), Geni Bers      Past Surgical History:  Procedure Laterality Date   AMPUTATION Left 04/20/2014   Procedure: 3rd toe amputation, 4th Toe Amputation,  5th Toe Amputation;  Surgeon: Newt Minion, MD;  Location: [place];  Service: Orthopedics;  Laterality: Left;   AMPUTATION Left  05/02/2014   Procedure: Midfoot Amputation;  Surgeon: Newt Minion, MD;  Location: [place];  Service: Orthopedics;  Laterality: Left;   AMPUTATION Left 06/17/2014   Procedure: AMPUTATION BELOW KNEE;  Surgeon: Newt Minion, MD;  Location: [place];  Service: Orthopedics;  Laterality: Left;   AMPUTATION Right 09/04/2018   Procedure: RIGHT BELOW KNEE AMPUTATION;  Surgeon: Newt Minion, MD;  Location: [place];  Service: Orthopedics;  Laterality: Right;  RIGHT BELOW KNEE AMPUTATION   AV FISTULA PLACEMENT Left    AV FISTULA PLACEMENT Left 05/10/2016   Procedure: Creation Left Arm Brachiocephalic Arteriovenous Fistula and Ligation of Radiocephalic Fistula;  Surgeon: Angelia Mould, MD;  Location: [place];  Service: Vascular;  Laterality: Left;   FEMUR IM NAIL Left 08/17/2016   Procedure: INTRAMEDULLARY (IM) RETROGRADE FEMORAL NAILING;  Surgeon: Marybelle Killings, MD;  Location: [place];  Service: Orthopedics;  Laterality: Left;   FOOT AMPUTATION THROUGH ANKLE Left 12/'21/2015   midfoot   IR GENERIC HISTORICAL Left 05/01/2016   IR THROMBECTOMY AV FISTULA W/THROMBOLYSIS/PTA INC/SHUNT/IMG LEFT 05/01/2016 [name], MD [facility]   IR GENERIC HISTORICAL  05/01/2016   IR US GUIDE VASC ACCESS LEFT 05/01/2016 [name], MD [facility]   IR GENERIC HISTORICAL  05/07/2016   IR FLUORO GUIDE CV LINE RIGHT 05/07/2016 Corrie Mckusick, DO [facility]   IR GENERIC HISTORICAL  05/07/2016   IR US GUIDE VASC ACCESS RIGHT 05/07/2016 Corrie Mckusick, DO [facility]   IR GENERIC HISTORICAL  05/22/2016   IR US GUIDE VASC ACCESS RIGHT 05/22/2016 Greggory Keen, MD [facility]   IR GENERIC HISTORICAL  05/22/2016   IR FLUORO GUIDE CV LINE RIGHT 05/22/2016 Greggory Keen, MD [facility]   IR REMOVAL TUN CV CATH W/O [facility]  08/21/2016   PERIPHERAL VASCULAR CATHETERIZATION Left 05/09/2016   Procedure: A/V Fistulagram;  Surgeon: Angelia Mould, MD;  Location: [place] CV LAB;  Service: Cardiovascular;  Laterality:  Left;  arm   TEE WITHOUT CARDIOVERSION N/A 06/22/2018   Procedure: TRANSESOPHAGEAL ECHOCARDIOGRAM (TEE);  Surgeon: Jerline Pain, MD;  Location: [facility] ENDOSCOPY;  Service: Cardiovascular;  Laterality: N/A;    Family History  Problem Relation Age of Onset   Diabetes Mother    Diabetes Father       Social History   Socioeconomic History   Marital status: Single    Spouse name: Not on file   Number of children: Not on file   Years of education: Not on file   Highest education level: Not on file  Occupational History   Not on file  Tobacco Use   Smoking status: Never   Smokeless tobacco: Never  Vaping Use   Vaping Use: Never used  Substance and Sexual Activity   Alcohol use: No   Drug  use: No   Sexual activity: Not Currently    Comment: offered condoms  Other Topics Concern   Not on file  Social History Narrative   ** Merged History Encounter **       Social Determinants of Health   Financial Resource Strain: Not on file  Food Insecurity: Not on file  Transportation Needs: Not on file  Physical Activity: Not on file  Stress: Not on file  Social Connections: Not on file    No Known Allergies   Current Outpatient Medications:    bictegravir-emtricitabine-tenofovir AF (BIKTARVY) 50-200-25 MG TABS tablet, Take 1 tablet by mouth daily., Disp: 30 tablet, Rfl: 11   Blood Glucose Monitoring Suppl (TRUE METRIX METER) DEVI, 1 each by Does not apply route 3 (three) times daily., Disp: 1 Device, Rfl: 0   glucose blood (TRUE METRIX BLOOD GLUCOSE TEST) test strip, 1 each by Other route 3 (three) times daily., Disp: 100 each, Rfl: 12   insulin NPH-regular Human (70-30) 100 UNIT/ML injection, INJECT 15 UNITS INTO THE SKIN 2 (TWO) TIMES DAILY BEFORE A MEAL., Disp: 10 mL, Rfl: 0   Insulin Syringe-Needle U-100 (BD INSULIN SYRINGE ULTRAFINE) 31G X 15/64" 0.5 ML MISC, 1 each by Does not apply route 2 (two) times daily., Disp: 100 each, Rfl: 5   midodrine (PROAMATINE) 10 MG tablet, Take 1  tablet (10 mg total) by mouth 3 (three) times daily with meals., Disp: 90 tablet, Rfl: 3   TRUEplus Lancets 28G MISC, 1 each by Does not apply route 3 (three) times daily., Disp: 100 each, Rfl: 11\  Review of Systems     Objective:   Physical Exam Constitutional:      Appearance: He is well-developed.  HENT:     Head: Normocephalic and atraumatic.  Eyes:     General:        Right eye: No discharge.        Left eye: No discharge.     Conjunctiva/sclera: Conjunctivae normal.  Cardiovascular:     Rate and Rhythm: Normal rate and regular rhythm.     Heart sounds: No murmur heard.   No gallop.  Pulmonary:     Effort: Pulmonary effort is normal. No respiratory distress.     Breath sounds: No stridor. No wheezing or rhonchi.  Abdominal:     General: There is no distension.     Palpations: Abdomen is soft.  Musculoskeletal:        General: No tenderness. Normal range of motion.     Cervical back: Normal range of motion and neck supple.  Skin:    General: Skin is warm and dry.     Coloration: Skin is not pale.     Findings: No erythema or rash.  Neurological:     General: No focal deficit present.     Mental Status: He is alert and oriented to person, place, and time.  Psychiatric:        Mood and Affect: Mood normal.        Behavior: Behavior normal.        Thought Content: Thought content normal.        Judgment: Judgment normal.   BKA sites 08/09/2020:  Right     Left     August 24th 2022:    Left:        Assessment & Plan:   Hypotension: He has had problem with this chronically and one of the large issues is that he cannot afford the midodrine due  to lack of coverage for it and he also cannot work right now so he cannot afford to pay out-of-pocket.  I have concerns as to whether he could have a systemic infection such as a bloodstream infection as has has had in the past or smoldering infection at his BKA sites where he is appearing to have near exposed  bone with bone palpable on the right and left where the skin is eroding.  He also has low back pain and certainly could have undiagnosed discitis as his back was not aggressively image when he was in the hospital.  Regardless he needs some fluid resuscitation and also a work-up for his low blood pressure.  We need a viable plan for him to get midodrine chronically or perhaps it should be considered whether or not he should change to a different form of dialysis such as peritoneal dialysis if it will prevent him from having such low blood pressures  Is getting blood work done in our clinic and we will set get a set of blood cultures a part of this.  He tells me that he is going go home and then call 911 and come back to the emergency department.  Certainly if he comes in we would be quite happy to see him and help direct work-up.  Skin ulcers at BKA sites due to poorly fitting prostheses:  Am concerned that this could be progressing to a more deep infection though it is neither site is overtly purulent.  He has been applying a topical powder that he has been buying from [place] to treat the 1 on the right side.  He would like to see Dr. Sharol Given again and certainly if he is admitted I would recommend consulting Dr. Sharol Given to take a look at him and his BKA sites.  I would also recommend getting plain films and then MRIs of both of these sites.    If there is some way that someone could pay for him to get new to new prosthetic limbs that fit better for him I think would be cost effective given he is so prone to being readmitted to the hospital and surely this is a more costly endeavor to keep having him admitted over and over again  HIV disease: He has been perfectly controlled historically.  I am rechecking a viral load today I am  I am rechecking his CD4 count I am going to continue his BIKTARVY and he is renewing HMAP  Low back pain: Have concerns that he could have an undiagnosed discitis or  vertebral osteomyelitis and I would recommend imaging his spine  ESRD on HD: with problem that he has going hypotension  I spent 53 minutes with the patient including face to face counseling of the patient past translator regarding his hypotension his skin breakdown at his BKA sites his low back pain his problems affording his midodrine reviewing his recent hospitalization at [facility] with [facility] including notes from Dr. Lake Bells in review of medical records before and during the visit and in coordination of his care with ID pharmacy and with the admitting hospitalist MD whom I tried to admit the patient to directly.

## 2021-01-04 LAB — T-HELPER CELL (CD4) - (RCID CLINIC ONLY)
CD4 % Helper T Cell: 22 % — ABNORMAL LOW (ref 33–65)
CD4 T Cell Abs: 370 /uL — ABNORMAL LOW (ref 400–1790)

## 2021-01-09 LAB — CBC WITH DIFFERENTIAL/PLATELET
Absolute Monocytes: 986 cells/uL — ABNORMAL HIGH (ref 200–950)
Basophils Absolute: 78 cells/uL (ref 0–200)
Basophils Relative: 0.7 %
Eosinophils Absolute: 123 cells/uL (ref 15–500)
Eosinophils Relative: 1.1 %
HCT: 29.1 % — ABNORMAL LOW (ref 38.5–50.0)
Hemoglobin: 9.9 g/dL — ABNORMAL LOW (ref 13.2–17.1)
Lymphs Abs: 1826 cells/uL (ref 850–3900)
MCH: 33.4 pg — ABNORMAL HIGH (ref 27.0–33.0)
MCHC: 34 g/dL (ref 32.0–36.0)
MCV: 98.3 fL (ref 80.0–100.0)
MPV: 10 fL (ref 7.5–12.5)
Monocytes Relative: 8.8 %
Neutro Abs: 8187 cells/uL — ABNORMAL HIGH (ref 1500–7800)
Neutrophils Relative %: 73.1 %
Platelets: 243 10*3/uL (ref 140–400)
RBC: 2.96 10*6/uL — ABNORMAL LOW (ref 4.20–5.80)
RDW: 13.9 % (ref 11.0–15.0)
Total Lymphocyte: 16.3 %
WBC: 11.2 10*3/uL — ABNORMAL HIGH (ref 3.8–10.8)

## 2021-01-09 LAB — CULTURE, BLOOD (SINGLE)
MICRO NUMBER:: 12286706
MICRO NUMBER:: 12286707
Result:: NO GROWTH
Result:: NO GROWTH
SPECIMEN QUALITY:: ADEQUATE
SPECIMEN QUALITY:: ADEQUATE

## 2021-01-09 LAB — HIV-1 RNA QUANT-NO REFLEX-BLD
HIV 1 RNA Quant: 28 Copies/mL — ABNORMAL HIGH
HIV-1 RNA Quant, Log: 1.44 Log cps/mL — ABNORMAL HIGH

## 2021-01-09 LAB — LIPID PANEL
Cholesterol: 160 mg/dL (ref <200)
HDL: 31 mg/dL — ABNORMAL LOW (ref >=40)
LDL Cholesterol (Calc): 88 mg/dL (calc)
Non-HDL Cholesterol (Calc): 129 mg/dL (calc) (ref <130)
Total CHOL/HDL Ratio: 5.2 (calc) — ABNORMAL HIGH (ref <5.0)
Triglycerides: 337 mg/dL — ABNORMAL HIGH (ref <150)

## 2021-01-09 LAB — RPR: RPR Ser Ql: NONREACTIVE

## 2021-01-10 ENCOUNTER — Ambulatory Visit (INDEPENDENT_AMBULATORY_CARE_PROVIDER_SITE_OTHER): Payer: Self-pay | Admitting: Family Medicine

## 2021-01-10 ENCOUNTER — Other Ambulatory Visit: Payer: Self-pay

## 2021-01-10 ENCOUNTER — Encounter: Payer: Self-pay | Admitting: Family Medicine

## 2021-01-10 VITALS — BP 96/57 | HR 87 | Temp 98.1°F | Resp 16 | Ht 59.0 in | Wt 110.0 lb

## 2021-01-10 DIAGNOSIS — Z7689 Persons encountering health services in other specified circumstances: Secondary | ICD-10-CM

## 2021-01-10 DIAGNOSIS — I959 Hypotension, unspecified: Secondary | ICD-10-CM

## 2021-01-10 DIAGNOSIS — Z794 Long term (current) use of insulin: Secondary | ICD-10-CM

## 2021-01-10 DIAGNOSIS — E1169 Type 2 diabetes mellitus with other specified complication: Secondary | ICD-10-CM

## 2021-01-10 DIAGNOSIS — B20 Human immunodeficiency virus [HIV] disease: Secondary | ICD-10-CM

## 2021-01-10 DIAGNOSIS — Z789 Other specified health status: Secondary | ICD-10-CM

## 2021-01-10 MED ORDER — MIDODRINE HCL 10 MG PO TABS
10.0000 mg | ORAL_TABLET | Freq: Three times a day (TID) | ORAL | 3 refills | Status: DC
  Filled 2021-01-10: qty 90, 30d supply, fill #0
  Filled 2021-03-01: qty 90, 30d supply, fill #1
  Filled 2021-04-10: qty 90, 30d supply, fill #2
  Filled 2021-05-23: qty 90, 30d supply, fill #0

## 2021-01-10 MED ORDER — INSULIN NPH ISOPHANE & REGULAR (70-30) 100 UNIT/ML ~~LOC~~ SUSP
SUBCUTANEOUS | 1 refills | Status: DC
  Filled 2021-01-10: qty 10, 30d supply, fill #0
  Filled 2021-03-01: qty 10, 33d supply, fill #1
  Filled 2021-04-10: qty 10, 30d supply, fill #1
  Filled 2021-05-23: qty 10, 28d supply, fill #0
  Filled 2021-06-28: qty 10, 28d supply, fill #1
  Filled 2021-08-13: qty 10, 28d supply, fill #2
  Filled 2021-09-13: qty 10, 28d supply, fill #3

## 2021-01-10 NOTE — Progress Notes (Signed)
New Patient Office Visit  Subjective:  Patient ID: Alvin Daniels, male    DOB: 1971/04/10  Age: 50 y.o. MRN: [id number]  CC:  Chief Complaint  Patient presents with   Establish Care   Diabetes    HPI Alvin Daniels presents for to establish care. Patient reports that he is being seen by ID to manage his HIV infection but needs a PCP to manage his diabetes and other common problems. Patient has been out of his meds. Patient has had issues with low blood pressure at dialysis and also was told that he needed that management also. 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[place])    Right   Diabetic neuropathy ([place])    ESRD (end stage renal disease) on dialysis ([place])    "TTS; don't remember street name" (05/03/2014)   Hepatitis C    HIV INFECTION 06/27/2010   Qualifier: Diagnosis of  By: Nickola Major CMA ( AAMA), Jacqueline     Hypotension [id number]   Metabolic bone disease [id number]   MRSA infection    Normocytic anemia 06/17/2012   Pancreatitis    Pressure ulcer of BKA stump, stage 2 ([place]) 11/22/2014   Renal disorder    Renal insufficiency    Severe protein-calorie malnutrition ([place]) 06/17/2012   Uncontrolled diabetes mellitus with complications ([place]) 0000000   Annotation: uncontrolled Qualifier: Diagnosis of  By: Nickola Major CMA Deborra Medina), Geni Bers      Past Surgical History:  Procedure Laterality Date   AMPUTATION Left 04/20/2014   Procedure: 3rd toe amputation, 4th Toe Amputation,  5th Toe Amputation;  Surgeon: Newt Minion, MD;  Location: [place];  Service: Orthopedics;  Laterality: Left;   AMPUTATION Left 05/02/2014   Procedure: Midfoot Amputation;  Surgeon: Newt Minion, MD;  Location: [place];   Service: Orthopedics;  Laterality: Left;   AMPUTATION Left 06/17/2014   Procedure: AMPUTATION BELOW KNEE;  Surgeon: Newt Minion, MD;  Location: [place];  Service: Orthopedics;  Laterality: Left;   AMPUTATION Right 09/04/2018   Procedure: RIGHT BELOW KNEE AMPUTATION;  Surgeon: Newt Minion, MD;  Location: [place];  Service: Orthopedics;  Laterality: Right;  RIGHT BELOW KNEE AMPUTATION   AV FISTULA PLACEMENT Left    AV FISTULA PLACEMENT Left 05/10/2016   Procedure: Creation Left Arm Brachiocephalic Arteriovenous Fistula and Ligation of Radiocephalic Fistula;  Surgeon: Angelia Mould, MD;  Location: [place];  Service: Vascular;  Laterality: Left;   FEMUR IM NAIL Left 08/17/2016   Procedure: INTRAMEDULLARY (IM) RETROGRADE FEMORAL NAILING;  Surgeon: Marybelle Killings, MD;  Location: [place];  Service: Orthopedics;  Laterality: Left;   FOOT AMPUTATION THROUGH ANKLE Left 12/'21/2015   midfoot   IR GENERIC HISTORICAL Left 05/01/2016   IR THROMBECTOMY AV FISTULA W/THROMBOLYSIS/PTA INC/SHUNT/IMG LEFT 05/01/2016 [name], MD [facility]   IR GENERIC HISTORICAL  05/01/2016   IR US GUIDE VASC ACCESS LEFT 05/01/2016 [name], MD [facility]   IR GENERIC HISTORICAL  05/07/2016   IR FLUORO GUIDE CV LINE RIGHT 05/07/2016 Corrie Mckusick, DO [facility]   IR GENERIC HISTORICAL  05/07/2016   IR US GUIDE VASC ACCESS RIGHT 05/07/2016 Corrie Mckusick, DO [facility]   IR GENERIC HISTORICAL  05/22/2016   IR US GUIDE VASC ACCESS RIGHT 05/22/2016 Greggory Keen, MD [facility]   IR GENERIC HISTORICAL  05/22/2016   IR FLUORO GUIDE CV LINE RIGHT 05/22/2016 Greggory Keen, MD [facility]   IR REMOVAL TUN CV CATH W/O [facility]  08/21/2016   PERIPHERAL VASCULAR CATHETERIZATION Left 05/09/2016   Procedure: A/V Fistulagram;  Surgeon: Angelia Mould, MD;  Location: [place] CV LAB;  Service: Cardiovascular;  Laterality: Left;  arm   TEE WITHOUT CARDIOVERSION N/A 06/22/2018   Procedure: TRANSESOPHAGEAL  ECHOCARDIOGRAM (TEE);  Surgeon: Jerline Pain, MD;  Location: [facility] ENDOSCOPY;  Service: Cardiovascular;  Laterality: N/A;    Family History  Problem Relation Age of Onset   Diabetes Mother    Diabetes Father     Social History   Socioeconomic History   Marital status: Single    Spouse name: Not on file   Number of children: Not on file   Years of education: Not on file   Highest education level: Not on file  Occupational History   Not on file  Tobacco Use   Smoking status: Never   Smokeless tobacco: Never  Vaping Use   Vaping Use: Never used  Substance and Sexual Activity   Alcohol use: No   Drug use: No   Sexual activity: Not Currently    Comment: offered condoms  Other Topics Concern   Not on file  Social History Narrative   ** Merged History Encounter **       Social Determinants of Health   Financial Resource Strain: Not on file  Food Insecurity: Not on file  Transportation Needs: Not on file  Physical Activity: Not on file  Stress: Not on file  Social Connections: Not on file  Intimate Partner Violence: Not on file    ROS Review of Systems  All other systems reviewed and are negative.  Objective:   Today's Vitals: BP (!) 96/57 (BP Location: Right Arm, Patient Position: Sitting, Cuff Size: Normal)   Pulse 87   Temp 98.1 F (36.7 C) (Oral)   Resp 16   Ht '4\' 11"'$  (1.499 m)   Wt 110 lb (49.9 kg)   SpO2 98%   BMI 22.22 kg/m   Physical Exam Vitals and nursing note reviewed.  Constitutional:      General: He is not in acute distress. Cardiovascular:     Rate and Rhythm: Normal rate and regular rhythm.  Pulmonary:     Effort: Pulmonary effort is normal.     Breath sounds: Normal breath sounds.  Abdominal:     Palpations: Abdomen is soft.     Tenderness: There is no abdominal tenderness.  Musculoskeletal:     Right Lower Extremity: Right leg is amputated below knee.     Left Lower Extremity: Left leg is amputated below knee.  Neurological:      General: No focal deficit present.     Mental Status: He is alert and oriented to person, place, and time.    Assessment & Plan:   1. Type 2 diabetes mellitus with other specified complication, with long-term current use of insulin ([name]) Meds refilled - compliance discussed. Will monitor  2. Hypotension, unspecified hypotension type Meds refilled  3. HIV infection, unspecified symptom status ([place]) Management as per consultant  4. Language barrier affecting health care Spanish interpretor  5. Encounter to establish care     Outpatient Encounter Medications as of 01/10/2021  Medication Sig   Blood Glucose Monitoring Suppl (TRUE METRIX METER)  DEVI 1 each by Does not apply route 3 (three) times daily.   glucose blood (TRUE METRIX BLOOD GLUCOSE TEST) test strip 1 each by Other route 3 (three) times daily.   Insulin Syringe-Needle U-100 (BD INSULIN SYRINGE ULTRAFINE) 31G X 15/64" 0.5 ML MISC 1 each by Does not apply route 2 (two) times daily.   TRUEplus Lancets 28G MISC 1 each by Does not apply route 3 (three) times daily.   [DISCONTINUED] insulin NPH-regular Human (70-30) 100 UNIT/ML injection INJECT 15 UNITS INTO THE SKIN 2 (TWO) TIMES DAILY BEFORE A MEAL.   bictegravir-emtricitabine-tenofovir AF (BIKTARVY) 50-200-25 MG TABS tablet Take 1 tablet by mouth daily. (Patient not taking: Reported on 01/10/2021)   insulin NPH-regular Human (70-30) 100 UNIT/ML injection INJECT 15 UNITS INTO THE SKIN 2 (TWO) TIMES DAILY BEFORE A MEAL.   midodrine (PROAMATINE) 10 MG tablet Take 1 tablet (10 mg total) by mouth 3 (three) times daily with meals.   [DISCONTINUED] midodrine (PROAMATINE) 10 MG tablet Take 1 tablet (10 mg total) by mouth 3 (three) times daily with meals. (Patient not taking: Reported on 01/10/2021)   No facility-administered encounter medications on file as of 01/10/2021.    Follow-up: Return in about 6 months (around 07/10/2021) for follow up, chronic med issues.   Becky Sax,  MD

## 2021-01-10 NOTE — Progress Notes (Signed)
Patient is here to est-care. Patient has HIV medication that he need refill and has not had in over 2 years.   Patient not able to give me urine albumin because of diallyls

## 2021-01-12 ENCOUNTER — Other Ambulatory Visit: Payer: Self-pay

## 2021-01-17 ENCOUNTER — Encounter: Payer: Self-pay | Admitting: Infectious Disease

## 2021-02-07 ENCOUNTER — Ambulatory Visit: Payer: Self-pay | Admitting: Infectious Disease

## 2021-02-28 ENCOUNTER — Encounter: Payer: Self-pay | Admitting: Infectious Disease

## 2021-02-28 ENCOUNTER — Ambulatory Visit (INDEPENDENT_AMBULATORY_CARE_PROVIDER_SITE_OTHER): Payer: Self-pay | Admitting: Infectious Disease

## 2021-02-28 ENCOUNTER — Other Ambulatory Visit: Payer: Self-pay

## 2021-02-28 VITALS — BP 115/75 | HR 96 | Temp 98.3°F | Ht 62.99 in | Wt 112.0 lb

## 2021-02-28 DIAGNOSIS — L89899 Pressure ulcer of other site, unspecified stage: Secondary | ICD-10-CM

## 2021-02-28 DIAGNOSIS — N185 Chronic kidney disease, stage 5: Secondary | ICD-10-CM

## 2021-02-28 DIAGNOSIS — E1122 Type 2 diabetes mellitus with diabetic chronic kidney disease: Secondary | ICD-10-CM

## 2021-02-28 DIAGNOSIS — N186 End stage renal disease: Secondary | ICD-10-CM

## 2021-02-28 DIAGNOSIS — Z89512 Acquired absence of left leg below knee: Secondary | ICD-10-CM

## 2021-02-28 DIAGNOSIS — R7881 Bacteremia: Secondary | ICD-10-CM

## 2021-02-28 DIAGNOSIS — B9562 Methicillin resistant Staphylococcus aureus infection as the cause of diseases classified elsewhere: Secondary | ICD-10-CM

## 2021-02-28 DIAGNOSIS — Z89511 Acquired absence of right leg below knee: Secondary | ICD-10-CM

## 2021-02-28 DIAGNOSIS — I9589 Other hypotension: Secondary | ICD-10-CM

## 2021-02-28 DIAGNOSIS — D631 Anemia in chronic kidney disease: Secondary | ICD-10-CM

## 2021-02-28 DIAGNOSIS — B20 Human immunodeficiency virus [HIV] disease: Secondary | ICD-10-CM

## 2021-02-28 DIAGNOSIS — T8789 Other complications of amputation stump: Secondary | ICD-10-CM

## 2021-02-28 MED ORDER — BIKTARVY 50-200-25 MG PO TABS: 1.0000 | ORAL_TABLET | Freq: Every day | ORAL | 11 refills | Status: DC

## 2021-02-28 NOTE — Progress Notes (Signed)
Subjective:  Chief complaint: Follow-up for HIV disease as well as low blood pressure and ulcers around his BKA sites  Patient ID: Alvin Daniels, male    DOB: Jan 04, 1971, 50 y.o.   MRN: [id number]  HPI   Note care provided with in person Spanish translator  50 year old Hispanic man living with HIV that has been perfectly controlled on Biktarvy. He has had bilateral BKA'S with problems tin July 2021 with MRSA bacteremia due to stump infection that was debrided. He could not get TEE due to low blood pressure.   He was continuing to have trouble with chronic hypotension and inability to afford midodrine.  Unfortunately Medicare for him is always a indefinite temporary partial coverage option that only covers his dialysis and does not cover any of his medications.  He was admitted to hospital in July 2022 with severe low back pain and hypotension that required fluids and Levophed.  He was being transferred to [facility] for further treatment when apparently his blood pressure was now in the normal range.  He was then discharged to [facility] with midodrine 10 mg 3 times a day with meals.  He was able to get this medication with a coupon for less than the $300 that it was going to cost him but not with an inconsequential cost to him.  He tells me that his nephrologist has been telling him only to use the midodrine on hemodialysis days which I do not think is correct he should take it all the time as far as I am concerned.  Indicates he came to clinic today for routine visit.  He is complaining of profound fatigue at times with dialysis and says on Monday he had a hard time getting through all of hemodialysis.  He also is again having low back pain that was more severe on Monday during dialysis than it has been in the last 2 days.  He had hemodialysis prior to last visit and apparently according to him had a normal blood pressure at the end of dialysis.  When he arrived for the clinic  his blood pressure is in the 70s over 40s.  He also again had low back pain.  He had skin breakdown around the AKA site where the prosthesis had been rubbing.  I attempted to have him come through the emergency department to be admitted but he refused we did obtain blood cultures which did not grow any organism.  His HIV was well controlled and we checked labs at that visit.  Continues to be highly adherent to [facility] he says he is also is now taking the to drain consistently.  He said that he was given a prescription for this at a clinic between here in [facility] and told that it was going to cost $150 but they was able to get it for $58.    He has been applying some penicillin and some another ointment that he has received from [place] directly to the wounds which he says are improving.       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   Diabetic neuropathy ([name])    ESRD (end stage renal disease) on dialysis ([place])    "  TTS; don't remember street name" (05/03/2014)   Hepatitis C    HIV INFECTION 06/27/2010   Qualifier: Diagnosis of  By: Nickola Major CMA ( AAMA), Jacqueline     Hypotension [id number]   Metabolic bone disease [id number]   MRSA infection    Normocytic anemia 06/17/2012   Pancreatitis    Pressure ulcer of BKA stump, stage 2 ([place]) 11/22/2014   Renal disorder    Renal insufficiency    Severe protein-calorie malnutrition ([place]) 06/17/2012   Uncontrolled diabetes mellitus with complications ([place]) 0000000   Annotation: uncontrolled Qualifier: Diagnosis of  By: Nickola Major CMA Deborra Medina), Geni Bers      Past Surgical History:  Procedure Laterality Date   AMPUTATION Left 04/20/2014   Procedure: 3rd toe amputation, 4th Toe  Amputation,  5th Toe Amputation;  Surgeon: Newt Minion, MD;  Location: [place];  Service: Orthopedics;  Laterality: Left;   AMPUTATION Left 05/02/2014   Procedure: Midfoot Amputation;  Surgeon: Newt Minion, MD;  Location: [place];  Service: Orthopedics;  Laterality: Left;   AMPUTATION Left 06/17/2014   Procedure: AMPUTATION BELOW KNEE;  Surgeon: Newt Minion, MD;  Location: [place];  Service: Orthopedics;  Laterality: Left;   AMPUTATION Right 09/04/2018   Procedure: RIGHT BELOW KNEE AMPUTATION;  Surgeon: Newt Minion, MD;  Location: [place];  Service: Orthopedics;  Laterality: Right;  RIGHT BELOW KNEE AMPUTATION   AV FISTULA PLACEMENT Left    AV FISTULA PLACEMENT Left 05/10/2016   Procedure: Creation Left Arm Brachiocephalic Arteriovenous Fistula and Ligation of Radiocephalic Fistula;  Surgeon: Angelia Mould, MD;  Location: [place];  Service: Vascular;  Laterality: Left;   FEMUR IM NAIL Left 08/17/2016   Procedure: INTRAMEDULLARY (IM) RETROGRADE FEMORAL NAILING;  Surgeon: Marybelle Killings, MD;  Location: [place];  Service: Orthopedics;  Laterality: Left;   FOOT AMPUTATION THROUGH ANKLE Left 12/'21/2015   midfoot   IR GENERIC HISTORICAL Left 05/01/2016   IR THROMBECTOMY AV FISTULA W/THROMBOLYSIS/PTA INC/SHUNT/IMG LEFT 05/01/2016 [name], MD [facility]   IR GENERIC HISTORICAL  05/01/2016   IR US GUIDE VASC ACCESS LEFT 05/01/2016 [name], MD [facility]   IR GENERIC HISTORICAL  05/07/2016   IR FLUORO GUIDE CV LINE RIGHT 05/07/2016 Corrie Mckusick, DO [facility]   IR GENERIC HISTORICAL  05/07/2016   IR US GUIDE VASC ACCESS RIGHT 05/07/2016 Corrie Mckusick, DO [facility]   IR GENERIC HISTORICAL  05/22/2016   IR US GUIDE VASC ACCESS RIGHT 05/22/2016 Greggory Keen, MD [facility]   IR GENERIC HISTORICAL  05/22/2016   IR FLUORO GUIDE CV LINE RIGHT 05/22/2016 Greggory Keen, MD [facility]   IR REMOVAL TUN CV CATH W/O [facility]  08/21/2016   PERIPHERAL VASCULAR CATHETERIZATION Left 05/09/2016    Procedure: A/V Fistulagram;  Surgeon: Angelia Mould, MD;  Location: [place] CV LAB;  Service: Cardiovascular;  Laterality: Left;  arm   TEE WITHOUT CARDIOVERSION N/A 06/22/2018   Procedure: TRANSESOPHAGEAL ECHOCARDIOGRAM (TEE);  Surgeon: Jerline Pain, MD;  Location: [facility] ENDOSCOPY;  Service: Cardiovascular;  Laterality: N/A;    Family History  Problem Relation Age of Onset   Diabetes Mother    Diabetes Father       Social History   Socioeconomic History   Marital status: Single    Spouse name: Not on file   Number of children: Not on file   Years of education: Not on file   Highest education level: Not on file  Occupational History   Not on  file  Tobacco Use   Smoking status: Never   Smokeless tobacco: Never  Vaping Use   Vaping Use: Never used  Substance and Sexual Activity   Alcohol use: No   Drug use: No   Sexual activity: Not Currently    Comment: offered condoms  Other Topics Concern   Not on file  Social History Narrative   ** Merged History Encounter **       Social Determinants of Health   Financial Resource Strain: Not on file  Food Insecurity: Not on file  Transportation Needs: Not on file  Physical Activity: Not on file  Stress: Not on file  Social Connections: Not on file    No Known Allergies   Current Outpatient Medications:    bictegravir-emtricitabine-tenofovir AF (BIKTARVY) 50-200-25 MG TABS tablet, Take 1 tablet by mouth daily. (Patient not taking: Reported on 01/10/2021), Disp: 30 tablet, Rfl: 11   Blood Glucose Monitoring Suppl (TRUE METRIX METER) DEVI, 1 each by Does not apply route 3 (three) times daily., Disp: 1 Device, Rfl: 0   glucose blood (TRUE METRIX BLOOD GLUCOSE TEST) test strip, 1 each by Other route 3 (three) times daily., Disp: 100 each, Rfl: 12   insulin NPH-regular Human (70-30) 100 UNIT/ML injection, INJECT 15 UNITS INTO THE SKIN 2 (TWO) TIMES DAILY BEFORE A MEAL., Disp: 30 mL, Rfl: 1   Insulin Syringe-Needle  U-100 (BD INSULIN SYRINGE ULTRAFINE) 31G X 15/64" 0.5 ML MISC, 1 each by Does not apply route 2 (two) times daily., Disp: 100 each, Rfl: 5   midodrine (PROAMATINE) 10 MG tablet, Take 1 tablet (10 mg total) by mouth 3 (three) times daily with meals., Disp: 90 tablet, Rfl: 3   TRUEplus Lancets 28G MISC, 1 each by Does not apply route 3 (three) times daily., Disp: 100 each, Rfl: 11\  Review of Systems  Constitutional:  Negative for activity change, appetite change, chills, diaphoresis, fatigue, fever and unexpected weight change.  HENT:  Negative for congestion, rhinorrhea, sinus pressure, sneezing, sore throat and trouble swallowing.   Eyes:  Negative for photophobia and visual disturbance.  Respiratory:  Negative for cough, chest tightness, shortness of breath, wheezing and stridor.   Cardiovascular:  Negative for chest pain, palpitations and leg swelling.  Gastrointestinal:  Negative for abdominal distention, abdominal pain, anal bleeding, blood in stool, constipation, diarrhea, nausea and vomiting.  Genitourinary:  Negative for difficulty urinating, dysuria, flank pain and hematuria.  Musculoskeletal:  Negative for arthralgias, back pain, gait problem, joint swelling and myalgias.  Skin:  Positive for wound. Negative for color change, pallor and rash.  Neurological:  Negative for dizziness, tremors, weakness and light-headedness.  Hematological:  Negative for adenopathy. Does not bruise/bleed easily.  Psychiatric/Behavioral:  Negative for agitation, behavioral problems, confusion, decreased concentration, dysphoric mood and sleep disturbance.       Objective:   Physical Exam Constitutional:      Appearance: He is well-developed.  HENT:     Head: Normocephalic and atraumatic.  Eyes:     Conjunctiva/sclera: Conjunctivae normal.  Cardiovascular:     Rate and Rhythm: Normal rate and regular rhythm.  Pulmonary:     Effort: Pulmonary effort is normal. No respiratory distress.     Breath  sounds: No wheezing.  Abdominal:     General: There is no distension.     Palpations: Abdomen is soft.  Musculoskeletal:        General: No tenderness. Normal range of motion.     Cervical back: Normal range of  motion and neck supple.  Skin:    General: Skin is warm and dry.     Coloration: Skin is not pale.     Findings: No erythema or rash.  Neurological:     General: No focal deficit present.     Mental Status: He is alert and oriented to person, place, and time.  Psychiatric:        Mood and Affect: Mood normal.        Behavior: Behavior normal.        Thought Content: Thought content normal.        Judgment: Judgment normal.      Right BKA site August 24th 2022:    02/26/2021:       Left: 01/03/2021:    02/28/2021:        Assessment & Plan:    HIV disease:  I have reviewed his viral load from January 03, 2021 which is 28 copies, essentially undetectable and CD4 count from same date which was 370.  I am continue his BIKTARVY prescription  Lab Results  Component Value Date   HIV1RNAQUANT 28 (H) 01/03/2021    Lab Results  Component Value Date   CD4TABS 370 (L) 01/03/2021   CD4TABS 363 (L) 08/09/2020   CD4TABS 324 (L) 06/09/2019    Ulcerations around BKA sites: He tells me that he is not able to afford the new prostheses that have been recommended to him that he tells me cost $40,000.  He is continuing topical therapy and I certainly hope that he has some success on concerned that the underlying issue of ill fitting prostheses will ultimately cause these ulcers to reemerge.  Fortunately do not appear acutely overtly infected.  End-stage renal disease on hemodialysis on Monday Wednesday and Friday.  Hypotension chronically this is improved with midodrine I have referred him again to the internal medicine clinic at [facility] to hopefully allow him to take advantage of their pharmacies lower cost of medications for an indigent  patient such as himself.  Vaccine counseling would recommend that he get a` bivalent COVID 19 vaccine and he appears to have had influenza vaccine recently.

## 2021-03-01 ENCOUNTER — Other Ambulatory Visit (HOSPITAL_COMMUNITY): Payer: Self-pay | Admitting: Nephrology

## 2021-03-01 ENCOUNTER — Other Ambulatory Visit: Payer: Self-pay

## 2021-03-01 DIAGNOSIS — N186 End stage renal disease: Secondary | ICD-10-CM

## 2021-03-05 ENCOUNTER — Other Ambulatory Visit: Payer: Self-pay | Admitting: Radiology

## 2021-03-05 ENCOUNTER — Other Ambulatory Visit (HOSPITAL_COMMUNITY): Payer: Self-pay | Admitting: Physician Assistant

## 2021-03-06 ENCOUNTER — Ambulatory Visit (HOSPITAL_COMMUNITY): Admission: RE | Admit: 2021-03-06 | Payer: Self-pay | Source: Ambulatory Visit

## 2021-04-10 ENCOUNTER — Other Ambulatory Visit: Payer: Self-pay

## 2021-05-23 ENCOUNTER — Other Ambulatory Visit: Payer: Self-pay

## 2021-05-24 ENCOUNTER — Other Ambulatory Visit: Payer: Self-pay

## 2021-06-06 ENCOUNTER — Encounter: Payer: Self-pay | Admitting: Infectious Disease

## 2021-06-06 ENCOUNTER — Other Ambulatory Visit: Payer: Self-pay

## 2021-06-06 ENCOUNTER — Ambulatory Visit (INDEPENDENT_AMBULATORY_CARE_PROVIDER_SITE_OTHER): Payer: Self-pay | Admitting: Infectious Disease

## 2021-06-06 ENCOUNTER — Ambulatory Visit: Payer: Self-pay

## 2021-06-06 VITALS — BP 131/80 | HR 95 | Temp 98.7°F | Wt 117.0 lb

## 2021-06-06 DIAGNOSIS — Z23 Encounter for immunization: Secondary | ICD-10-CM

## 2021-06-06 DIAGNOSIS — I9589 Other hypotension: Secondary | ICD-10-CM

## 2021-06-06 DIAGNOSIS — E1122 Type 2 diabetes mellitus with diabetic chronic kidney disease: Secondary | ICD-10-CM

## 2021-06-06 DIAGNOSIS — L89899 Pressure ulcer of other site, unspecified stage: Secondary | ICD-10-CM

## 2021-06-06 DIAGNOSIS — Z992 Dependence on renal dialysis: Secondary | ICD-10-CM

## 2021-06-06 DIAGNOSIS — B20 Human immunodeficiency virus [HIV] disease: Secondary | ICD-10-CM

## 2021-06-06 DIAGNOSIS — Z89511 Acquired absence of right leg below knee: Secondary | ICD-10-CM

## 2021-06-06 DIAGNOSIS — T8740 Infection of amputation stump, unspecified extremity: Secondary | ICD-10-CM

## 2021-06-06 DIAGNOSIS — Z89512 Acquired absence of left leg below knee: Secondary | ICD-10-CM

## 2021-06-06 DIAGNOSIS — N186 End stage renal disease: Secondary | ICD-10-CM

## 2021-06-06 MED ORDER — BIKTARVY 50-200-25 MG PO TABS: 1.0000 | ORAL_TABLET | Freq: Every day | ORAL | 11 refills | Status: DC

## 2021-06-06 MED ORDER — MIDODRINE HCL 10 MG PO TABS
10.0000 mg | ORAL_TABLET | Freq: Three times a day (TID) | ORAL | 11 refills | Status: DC
  Filled 2021-06-06 – 2021-06-28 (×2): qty 90, 30d supply, fill #0
  Filled 2021-08-13: qty 90, 30d supply, fill #1
  Filled 2021-09-13: qty 90, 30d supply, fill #2
  Filled 2021-10-24: qty 90, 30d supply, fill #3
  Filled 2021-11-19: qty 90, 30d supply, fill #4
  Filled 2021-12-27: qty 90, 30d supply, fill #5

## 2021-06-06 NOTE — Progress Notes (Signed)
Subjective:  Chief complaint: Follow-up for HIV disease on medications  Patient ID: Alvin Daniels, male    DOB: Jan 21, 1971, 51 y.o.   MRN: [id number]  HPI   Note care provided with in person Spanish translator  51 year old Hispanic man living with HIV that has been perfectly controlled on Biktarvy. He has had bilateral BKA'S with problems tin July 2021 with MRSA bacteremia due to stump infection that was debrided. He could not get TEE due to low blood pressure.   He was continuing to have trouble with chronic hypotension having difficulty obtaining midodrine due to price.  He is currently on midodrine 10 mg 3 times daily but only taking it on dialysis days due to a miscommunication about how he could take the pills and how many pills he had.  He continues to have some ulcers bilaterally at his BKA sites due to poor fitting prosthesis and inability to afford new prosthesis.  He says these areas have improved with time now that he has been placing bandages on each of them to try to prevent further skin breakdown.  He also continues to apply topical antibiotic that he purchases the [place].     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   Diabetic neuropathy ([place])    ESRD (end stage renal disease) on dialysis ([place])    "TTS; don't remember street name" (05/03/2014)   Hepatitis C    HIV INFECTION 06/27/2010   Qualifier: Diagnosis of  By: Nickola Major CMA ( AAMA), Jacqueline     Hypotension 05/15/7251   Metabolic bone disease 10/16/4401   MRSA infection    Normocytic anemia 06/17/2012   Pancreatitis    Pressure ulcer of BKA stump, stage 2 ([place]) 11/22/2014   Renal disorder    Renal insufficiency    Severe protein-calorie  malnutrition ([place]) 06/17/2012   Uncontrolled diabetes mellitus with complications 4/74/2595   Annotation: uncontrolled Qualifier: Diagnosis of  By: Nickola Major CMA Deborra Medina), Geni Bers      Past Surgical History:  Procedure Laterality Date   AMPUTATION Left 04/20/2014   Procedure: 3rd toe amputation, 4th Toe Amputation,  5th Toe Amputation;  Surgeon: Newt Minion, MD;  Location: [place];  Service: Orthopedics;  Laterality: Left;   AMPUTATION Left 05/02/2014   Procedure: Midfoot Amputation;  Surgeon: Newt Minion, MD;  Location: [place];  Service: Orthopedics;  Laterality: Left;   AMPUTATION Left 06/17/2014   Procedure: AMPUTATION BELOW KNEE;  Surgeon: Newt Minion, MD;  Location: [place];  Service: Orthopedics;  Laterality: Left;   AMPUTATION Right 09/04/2018   Procedure: RIGHT BELOW KNEE AMPUTATION;  Surgeon: Newt Minion, MD;  Location: [place];  Service: Orthopedics;  Laterality: Right;  RIGHT BELOW KNEE AMPUTATION   AV FISTULA PLACEMENT Left    AV FISTULA PLACEMENT Left 05/10/2016   Procedure: Creation Left Arm Brachiocephalic Arteriovenous Fistula and Ligation of Radiocephalic Fistula;  Surgeon: Angelia Mould, MD;  Location: [place];  Service: Vascular;  Laterality: Left;   FEMUR IM NAIL Left 08/17/2016   Procedure: INTRAMEDULLARY (IM) RETROGRADE FEMORAL NAILING;  Surgeon: Marybelle Killings, MD;  Location: [place];  Service: Orthopedics;  Laterality: Left;   FOOT AMPUTATION THROUGH ANKLE  Left 12/'21/2015   midfoot   IR GENERIC HISTORICAL Left 05/01/2016   IR THROMBECTOMY AV FISTULA W/THROMBOLYSIS/PTA INC/SHUNT/IMG LEFT 05/01/2016 [name], MD [facility]   IR GENERIC HISTORICAL  05/01/2016   IR US GUIDE VASC ACCESS LEFT 05/01/2016 [name], MD [facility]   IR GENERIC HISTORICAL  05/07/2016   IR FLUORO GUIDE CV LINE RIGHT 05/07/2016 Corrie Mckusick, DO [facility]   IR GENERIC HISTORICAL  05/07/2016   IR US GUIDE VASC ACCESS RIGHT 05/07/2016 Corrie Mckusick, DO [facility]   IR  GENERIC HISTORICAL  05/22/2016   IR US GUIDE VASC ACCESS RIGHT 05/22/2016 Greggory Keen, MD [facility]   IR GENERIC HISTORICAL  05/22/2016   IR FLUORO GUIDE CV LINE RIGHT 05/22/2016 Greggory Keen, MD [facility]   IR REMOVAL TUN CV CATH W/O [facility]  08/21/2016   PERIPHERAL VASCULAR CATHETERIZATION Left 05/09/2016   Procedure: A/V Fistulagram;  Surgeon: Angelia Mould, MD;  Location: [place] CV LAB;  Service: Cardiovascular;  Laterality: Left;  arm   TEE WITHOUT CARDIOVERSION N/A 06/22/2018   Procedure: TRANSESOPHAGEAL ECHOCARDIOGRAM (TEE);  Surgeon: Jerline Pain, MD;  Location: [facility] ENDOSCOPY;  Service: Cardiovascular;  Laterality: N/A;    Family History  Problem Relation Age of Onset   Diabetes Mother    Diabetes Father       Social History   Socioeconomic History   Marital status: Single    Spouse name: Not on file   Number of children: Not on file   Years of education: Not on file   Highest education level: Not on file  Occupational History   Not on file  Tobacco Use   Smoking status: Never   Smokeless tobacco: Never  Vaping Use   Vaping Use: Never used  Substance and Sexual Activity   Alcohol use: No   Drug use: No   Sexual activity: Not Currently    Comment: declined condoms  Other Topics Concern   Not on file  Social History Narrative   ** Merged History Encounter **       Social Determinants of Health   Financial Resource Strain: Not on file  Food Insecurity: Not on file  Transportation Needs: Not on file  Physical Activity: Not on file  Stress: Not on file  Social Connections: Not on file    No Known Allergies   Current Outpatient Medications:    bictegravir-emtricitabine-tenofovir AF (BIKTARVY) 50-200-25 MG TABS tablet, Take 1 tablet by mouth daily., Disp: 30 tablet, Rfl: 11   Blood Glucose Monitoring Suppl (TRUE METRIX METER) DEVI, 1 each by Does not apply route 3 (three) times daily., Disp: 1 Device, Rfl: 0   glucose blood (TRUE METRIX BLOOD  GLUCOSE TEST) test strip, 1 each by Other route 3 (three) times daily., Disp: 100 each, Rfl: 12   insulin NPH-regular Human (70-30) 100 UNIT/ML injection, INJECT 15 UNITS INTO THE SKIN 2 (TWO) TIMES DAILY BEFORE A MEAL., Disp: 30 mL, Rfl: 1   Insulin Syringe-Needle U-100 (BD INSULIN SYRINGE ULTRAFINE) 31G X 15/64" 0.5 ML MISC, 1 each by Does not apply route 2 (two) times daily., Disp: 100 each, Rfl: 5   midodrine (PROAMATINE) 10 MG tablet, Take 1 tablet (10 mg total) by mouth 3 (three) times daily with meals., Disp: 90 tablet, Rfl: 3   TRUEplus Lancets 28G MISC, 1 each by Does not apply route 3 (three) times daily., Disp: 100 each, Rfl: 11\  Review of Systems  Constitutional:  Negative for activity change, appetite  change, chills, diaphoresis, fatigue, fever and unexpected weight change.  HENT:  Negative for congestion, hearing loss, rhinorrhea, sinus pressure, sneezing, sore throat, tinnitus and trouble swallowing.   Eyes:  Negative for photophobia and visual disturbance.  Respiratory:  Negative for cough, chest tightness, shortness of breath, wheezing and stridor.   Cardiovascular:  Negative for chest pain, palpitations and leg swelling.  Gastrointestinal:  Negative for abdominal distention, abdominal pain, anal bleeding, blood in stool, constipation, diarrhea, nausea and vomiting.  Genitourinary:  Negative for difficulty urinating, dysuria, flank pain and hematuria.  Musculoskeletal:  Negative for arthralgias, back pain, gait problem, joint swelling and myalgias.  Skin:  Negative for color change, pallor, rash and wound.  Neurological:  Negative for dizziness, tremors, weakness, light-headedness and headaches.  Hematological:  Negative for adenopathy. Does not bruise/bleed easily.  Psychiatric/Behavioral:  Negative for agitation, behavioral problems, confusion, decreased concentration, dysphoric mood, sleep disturbance and suicidal ideas. The patient is not nervous/anxious.       Objective:    Physical Exam Constitutional:      Appearance: He is well-developed.  HENT:     Head: Normocephalic and atraumatic.  Eyes:     Conjunctiva/sclera: Conjunctivae normal.  Cardiovascular:     Rate and Rhythm: Normal rate and regular rhythm.  Pulmonary:     Effort: Pulmonary effort is normal. No respiratory distress.     Breath sounds: No wheezing.  Abdominal:     General: There is no distension.     Palpations: Abdomen is soft.  Musculoskeletal:        General: No tenderness. Normal range of motion.     Cervical back: Normal range of motion and neck supple.  Skin:    General: Skin is warm.  Neurological:     General: No focal deficit present.     Mental Status: He is alert and oriented to person, place, and time.  Psychiatric:        Mood and Affect: Mood normal.        Behavior: Behavior normal.        Thought Content: Thought content normal.        Judgment: Judgment normal.      Right BKA site August 24th 2022:    02/26/2021:    06/06/2021:      Left: 01/03/2021:    02/28/2021:    06/04/2021:       Assessment & Plan:    HIV disease:  I am checking a viral load and a CD4 count CBC  Lab Results  Component Value Date   HIV1RNAQUANT 28 (H) 01/03/2021    Lab Results  Component Value Date   CD4TABS 370 (L) 01/03/2021   CD4TABS 363 (L) 08/09/2020   CD4TABS 324 (L) 06/09/2019    Ulceration from BKA sites: Again cannot afford new prosthesis:  He is continue to try to protect the site with bandages.  End-stage renal disease on hemodialysis Monday Wednesday Friday and prefers to come to our clinic after dialysis on dialysis days  Fortunately do not appear acutely overtly infected.  End-stage renal disease on hemodialysis on Monday Wednesday and Friday.  Hypotension:  I sent new prescription for midodrine 10 mg 3 times daily to the pharmacy [facility].  Pharmacy student from [facility] explained to him how to take it each day 3 times  daily.  Vaccine counseling would recommend that he get a` bivalent COVID 19 vaccine and he appears to have had influenza vaccine recently.

## 2021-06-07 LAB — T-HELPER CELL (CD4) - (RCID CLINIC ONLY)
CD4 % Helper T Cell: 23 % — ABNORMAL LOW (ref 33–65)
CD4 T Cell Abs: 375 /uL — ABNORMAL LOW (ref 400–1790)

## 2021-06-09 LAB — COMPLETE METABOLIC PANEL WITH GFR
AG Ratio: 0.9 (calc) — ABNORMAL LOW (ref 1.0–2.5)
ALT: 13 U/L (ref 9–46)
AST: 27 U/L (ref 10–35)
Albumin: 4.4 g/dL (ref 3.6–5.1)
Alkaline phosphatase (APISO): 105 U/L (ref 35–144)
BUN/Creatinine Ratio: 3 (calc) — ABNORMAL LOW (ref 6–22)
BUN: 17 mg/dL (ref 7–25)
CO2: 36 mmol/L — ABNORMAL HIGH (ref 20–32)
Calcium: 8.8 mg/dL (ref 8.6–10.3)
Chloride: 91 mmol/L — ABNORMAL LOW (ref 98–110)
Creat: 4.88 mg/dL — ABNORMAL HIGH (ref 0.70–1.30)
Globulin: 4.8 g/dL (calc) — ABNORMAL HIGH (ref 1.9–3.7)
Glucose, Bld: 291 mg/dL — ABNORMAL HIGH (ref 65–99)
Potassium: 4.4 mmol/L (ref 3.5–5.3)
Sodium: 136 mmol/L (ref 135–146)
Total Bilirubin: 0.4 mg/dL (ref 0.2–1.2)
Total Protein: 9.2 g/dL — ABNORMAL HIGH (ref 6.1–8.1)
eGFR: 14 mL/min/{1.73_m2} — ABNORMAL LOW (ref >=60)

## 2021-06-09 LAB — CBC WITH DIFFERENTIAL/PLATELET
Absolute Monocytes: 506 cells/uL (ref 200–950)
Basophils Absolute: 66 cells/uL (ref 0–200)
Basophils Relative: 0.8 %
Eosinophils Absolute: 465 cells/uL (ref 15–500)
Eosinophils Relative: 5.6 %
HCT: 32.5 % — ABNORMAL LOW (ref 38.5–50.0)
Hemoglobin: 11.1 g/dL — ABNORMAL LOW (ref 13.2–17.1)
Lymphs Abs: 1702 cells/uL (ref 850–3900)
MCH: 33.8 pg — ABNORMAL HIGH (ref 27.0–33.0)
MCHC: 34.2 g/dL (ref 32.0–36.0)
MCV: 99.1 fL (ref 80.0–100.0)
MPV: 9.5 fL (ref 7.5–12.5)
Monocytes Relative: 6.1 %
Neutro Abs: 5561 cells/uL (ref 1500–7800)
Neutrophils Relative %: 67 %
Platelets: 278 10*3/uL (ref 140–400)
RBC: 3.28 10*6/uL — ABNORMAL LOW (ref 4.20–5.80)
RDW: 13.7 % (ref 11.0–15.0)
Total Lymphocyte: 20.5 %
WBC: 8.3 10*3/uL (ref 3.8–10.8)

## 2021-06-09 LAB — HIV-1 RNA QUANT-NO REFLEX-BLD
HIV 1 RNA Quant: NOT DETECTED Copies/mL
HIV-1 RNA Quant, Log: NOT DETECTED Log cps/mL

## 2021-06-09 LAB — LIPID PANEL
Cholesterol: 145 mg/dL (ref <200)
HDL: 36 mg/dL — ABNORMAL LOW (ref >=40)
LDL Cholesterol (Calc): 68 mg/dL (calc)
Non-HDL Cholesterol (Calc): 109 mg/dL (calc) (ref <130)
Total CHOL/HDL Ratio: 4 (calc) (ref <5.0)
Triglycerides: 337 mg/dL — ABNORMAL HIGH (ref <150)

## 2021-06-09 LAB — RPR: RPR Ser Ql: NONREACTIVE

## 2021-06-28 ENCOUNTER — Other Ambulatory Visit: Payer: Self-pay

## 2021-06-29 ENCOUNTER — Other Ambulatory Visit: Payer: Self-pay

## 2021-07-02 ENCOUNTER — Ambulatory Visit (INDEPENDENT_AMBULATORY_CARE_PROVIDER_SITE_OTHER): Payer: Self-pay | Admitting: Orthopedic Surgery

## 2021-07-02 ENCOUNTER — Other Ambulatory Visit: Payer: Self-pay

## 2021-07-02 DIAGNOSIS — Z89512 Acquired absence of left leg below knee: Secondary | ICD-10-CM

## 2021-07-02 DIAGNOSIS — Z89511 Acquired absence of right leg below knee: Secondary | ICD-10-CM

## 2021-07-03 ENCOUNTER — Encounter: Payer: Self-pay | Admitting: Orthopedic Surgery

## 2021-07-03 NOTE — Progress Notes (Signed)
Office Visit Note   Patient: Alvin Daniels           Date of Birth: November 24, 1970           MRN: [id number] Visit Date: 07/02/2021              Requested by: Dorna Mai, [address] [place],  [place] [address] PCP: Dorna Mai, MD  Chief Complaint  Patient presents with   Right Leg - Follow-up    Right BKA   Left Leg - Follow-up    Left BKA      HPI: Patient is a 51 year old gentleman with bilateral transtibial amputations.  Patient has been subsiding into his socket he has torn liners and ulcers over the fibular head bilaterally.  Assessment & Plan: Visit Diagnoses:  1. S/P bilateral below knee amputation [facility])     Plan: Patient was provided a prescription for new socket liner materials and supplies.  For both transtibial amputations.  Follow-Up Instructions: Return if symptoms worsen or fail to improve.   Ortho Exam  Patient is alert, oriented, no adenopathy, well-dressed, normal affect, normal respiratory effort. Examination patient has ulceration over the fibular head bilaterally from subsiding into his socket.  The liners are torn.  Patient also has an ulceration over the distal pole of the patella on the left.  Patient is an existing bilateral transtibial  amputee.  Patient's current comorbidities are not expected to impact the ability to function with the prescribed prosthesis. Patient verbally communicates a strong desire to use a prosthesis. Patient currently requires mobility aids to ambulate without a prosthesis.  Expects not to use mobility aids with a new prosthesis.  Patient is a K3 level ambulator that spends a lot of time walking around on uneven terrain over obstacles, up and down stairs, and ambulates with a variable cadence.     Imaging: No results found.    Labs: Lab Results  Component Value Date   HGBA1C 8.2 (H) 12/09/2020   HGBA1C 6.9 (H) 12/02/2019   HGBA1C 6.8 09/28/2018   ESRSEDRATE 46 (H) 06/21/2019    ESRSEDRATE 39 (H) 06/09/2019   ESRSEDRATE 126 (H) 08/29/2018   CRP 11.5 (H) 10/02/2019   CRP 12.2 (H) 10/01/2019   CRP 12.0 (H) 09/30/2019   REPTSTATUS 12/14/2020 FINAL 12/09/2020   GRAMSTAIN  08/30/2018    FEW WBC PRESENT, PREDOMINANTLY PMN NO ORGANISMS SEEN Performed at [facility] [address], [place], [place] [address]    CULT  12/09/2020    NO GROWTH 5 DAYS Performed at [facility] [address] [place], [place] [address]    LABORGA METHICILLIN RESISTANT STAPHYLOCOCCUS AUREUS 12/02/2019     Lab Results  Component Value Date   ALBUMIN 3.7 12/08/2020   ALBUMIN 2.3 (L) 03/01/2020   ALBUMIN 2.9 (L) 02/28/2020   PREALBUMIN 9.4 (L) 08/29/2018    Lab Results  Component Value Date   MG 2.4 12/09/2020   MG 2.2 08/16/2016   MG 2.0 05/28/2016   Lab Results  Component Value Date   VD25OH 26 (L) 10/25/2013    Lab Results  Component Value Date   PREALBUMIN 9.4 (L) 08/29/2018   CBC EXTENDED Latest Ref Rng & Units 06/06/2021 01/03/2021 12/09/2020  WBC 3.8 - 10.8 Thousand/uL 8.3 11.2(H) 13.1(H)  RBC 4.20 - 5.80 Million/uL 3.28(L) 2.96(L) 3.07(L)  HGB 13.2 - 17.1 g/dL 11.1(L) 9.9(L) 10.3(L)  HCT 38.5 - 50.0 % 32.5(L) 29.1(L) 31.0(L)  PLT 140 - 400 Thousand/uL  278 243 261  NEUTROABS 1,500 - 7,800 cells/uL 5,561 8,187(H) -  LYMPHSABS 850 - 3,900 cells/uL 1,702 1,826 -     There is no height or weight on file to calculate BMI.  Orders:  No orders of the defined types were placed in this encounter.  No orders of the defined types were placed in this encounter.    Procedures: No procedures performed  Clinical Data: No additional findings.  ROS:  All other systems negative, except as noted in the HPI. Review of Systems  Objective: Vital Signs: There were no vitals taken for this visit.  Specialty Comments:  No specialty comments available.  PMFS History: Patient Active Problem List   Diagnosis Date Noted   Acute bilateral low back pain  01/03/2021   Shock ([place]) 12/08/2020   Sepsis ([place]) 02/28/2020   Amputation stump infection ([place]) 12/02/2019   Scapular fracture 09/28/2019   Chest pain 09/27/2019   Hx MRSA infection 09/21/2019   BKA stump complication ([place]) 14/43/1540   Back pain 06/09/2019   Abscess of foot    Abscess of ankle 08/29/2018   Charcot foot due to diabetes mellitus ([place]) 07/23/2018   MRSA bacteremia    Bacteremia 06/18/2018   Diabetic foot ulcer ([place]) 11/19/2017   Femur fracture, left ([place]) 08/16/2016   HIV (human immunodeficiency virus infection) ([place]) 08/16/2016   GERD (gastroesophageal reflux disease) 05/24/2016   Burn 04/10/2016   Pressure ulcer of BKA stump ([place]) 11/22/2014   Chronic hepatitis C without hepatic coma ([place]) 11/22/2014   S/P bilateral BKA (below knee amputation) ([place]) 06/17/2014   HIV disease ([place])    Poor dentition 10/25/2013   ESRD on hemodialysis ([place]) 10/25/2013   Vision changes 06/21/2013   Orthostatic headache 04/15/2013   Anemia of chronic renal failure 08/67/6195   Metabolic bone disease 09/32/6712   Chronic diarrhea 06/17/2012   Severe protein-calorie malnutrition ([place]) 06/17/2012   Normocytic anemia 06/17/2012   Hypotension 06/02/2012   Cellulitis of right lower extremity 10/19/2010   Polyneuropathy in diabetes(357.2) 06/27/2010   Diabetes mellitus with ESRD (end-stage renal disease) ([place]) 01/03/2010   Past Medical History:  Diagnosis Date   Acute bilateral low back pain 01/03/2021   AIDS ([place]) 11/22/2014   Back pain 06/09/2019   BKA stump complication ([place]) 4/58/0998   Chronic diarrhea    Chronic hepatitis C without hepatic coma ([place]) 11/22/2014   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[place])    Right   Diabetic neuropathy ([name])    ESRD (end stage renal disease) on dialysis ([place])    "TTS; don't remember street name" (05/03/2014)   Hepatitis C    HIV INFECTION 06/27/2010   Qualifier: Diagnosis of  By: Nickola Major CMA ( AAMA), Jacqueline     Hypotension 3/38/2505    Metabolic bone disease 07/20/7671   MRSA infection    Normocytic anemia 06/17/2012   Pancreatitis    Pressure ulcer of BKA stump, stage 2 ([place]) 11/22/2014   Renal disorder    Renal insufficiency    Severe protein-calorie malnutrition ([place]) 06/17/2012   Uncontrolled diabetes mellitus with complications 08/29/3788   Annotation: uncontrolled Qualifier: Diagnosis of  By: Nickola Major CMA ( AAMA), Jacqueline      Family History  Problem Relation Age of Onset   Diabetes Mother    Diabetes Father     Past Surgical History:  Procedure Laterality Date   AMPUTATION Left 04/20/2014   Procedure: 3rd toe amputation, 4th Toe Amputation,  5th Toe Amputation;  Surgeon: Newt Minion, MD;  Location: [place];  Service: Orthopedics;  Laterality: Left;   AMPUTATION Left 05/02/2014   Procedure: Midfoot Amputation;  Surgeon: Newt Minion, MD;  Location: [place];  Service: Orthopedics;  Laterality: Left;   AMPUTATION Left 06/17/2014   Procedure: AMPUTATION BELOW KNEE;  Surgeon: Newt Minion, MD;  Location: [place];  Service: Orthopedics;  Laterality: Left;   AMPUTATION Right 09/04/2018   Procedure: RIGHT BELOW KNEE AMPUTATION;  Surgeon: Newt Minion, MD;  Location: [place];  Service: Orthopedics;  Laterality: Right;  RIGHT BELOW KNEE AMPUTATION   AV FISTULA PLACEMENT Left    AV FISTULA PLACEMENT Left 05/10/2016   Procedure: Creation Left Arm Brachiocephalic Arteriovenous Fistula and Ligation of Radiocephalic Fistula;  Surgeon: Angelia Mould, MD;  Location: [place];  Service: Vascular;  Laterality: Left;   FEMUR IM NAIL Left 08/17/2016   Procedure: INTRAMEDULLARY (IM) RETROGRADE FEMORAL NAILING;  Surgeon: Marybelle Killings, MD;  Location: [facility] [place];  Service: Orthopedics;  Laterality: Left;   FOOT AMPUTATION THROUGH ANKLE Left 12/'21/2015   midfoot   IR GENERIC HISTORICAL Left 05/01/2016   IR THROMBECTOMY AV FISTULA W/THROMBOLYSIS/PTA INC/SHUNT/IMG LEFT 05/01/2016 [name], MD [facility]   IR GENERIC HISTORICAL   05/01/2016   IR US GUIDE VASC ACCESS LEFT 05/01/2016 [name], MD [facility]   IR GENERIC HISTORICAL  05/07/2016   IR FLUORO GUIDE CV LINE RIGHT 05/07/2016 Corrie Mckusick, DO [facility]   IR GENERIC HISTORICAL  05/07/2016   IR US GUIDE VASC ACCESS RIGHT 05/07/2016 Corrie Mckusick, DO [facility]   IR GENERIC HISTORICAL  05/22/2016   IR US GUIDE VASC ACCESS RIGHT 05/22/2016 Greggory Keen, MD [facility]   IR GENERIC HISTORICAL  05/22/2016   IR FLUORO GUIDE CV LINE RIGHT 05/22/2016 Greggory Keen, MD [facility]   IR REMOVAL TUN CV CATH W/O [facility]  08/21/2016   PERIPHERAL VASCULAR CATHETERIZATION Left 05/09/2016   Procedure: A/V Fistulagram;  Surgeon: Angelia Mould, MD;  Location: [place] CV LAB;  Service: Cardiovascular;  Laterality: Left;  arm   TEE WITHOUT CARDIOVERSION N/A 06/22/2018   Procedure: TRANSESOPHAGEAL ECHOCARDIOGRAM (TEE);  Surgeon: Jerline Pain, MD;  Location: [facility] ENDOSCOPY;  Service: Cardiovascular;  Laterality: N/A;   Social History   Occupational History   Not on file  Tobacco Use   Smoking status: Never   Smokeless tobacco: Never  Vaping Use   Vaping Use: Never used  Substance and Sexual Activity   Alcohol use: No   Drug use: No   Sexual activity: Not Currently    Comment: declined condoms

## 2021-07-11 ENCOUNTER — Ambulatory Visit: Payer: Self-pay | Admitting: Family Medicine

## 2021-07-16 ENCOUNTER — Encounter: Payer: Self-pay | Admitting: Family Medicine

## 2021-07-16 ENCOUNTER — Other Ambulatory Visit: Payer: Self-pay

## 2021-07-16 ENCOUNTER — Ambulatory Visit (INDEPENDENT_AMBULATORY_CARE_PROVIDER_SITE_OTHER): Payer: Self-pay | Admitting: Family Medicine

## 2021-07-16 VITALS — BP 106/70 | HR 88 | Temp 98.7°F | Resp 16 | Wt 114.4 lb

## 2021-07-16 DIAGNOSIS — E1169 Type 2 diabetes mellitus with other specified complication: Secondary | ICD-10-CM

## 2021-07-16 DIAGNOSIS — H5712 Ocular pain, left eye: Secondary | ICD-10-CM

## 2021-07-16 DIAGNOSIS — Z794 Long term (current) use of insulin: Secondary | ICD-10-CM

## 2021-07-16 NOTE — Progress Notes (Signed)
Patient is here for follow-up chronic issues DMII ? ?Patient said that everything has been going well except for pain in his left eye when he does dialysis ? ?  ?

## 2021-07-17 LAB — POCT GLYCOSYLATED HEMOGLOBIN (HGB A1C): Hemoglobin A1C: 7.4 % — AB (ref 4.0–5.6)

## 2021-07-17 NOTE — Progress Notes (Signed)
? ?New Patient Office Visit ? ?Subjective:  ?Patient ID: Alvin Daniels, male    DOB: 1971-05-06  Age: 51 y.o. MRN: [id number] ? ?CC:  ?Chief Complaint  ?Patient presents with  ? Follow-up  ? Diabetes  ? ? ?HPI ?Alvin Daniels presents for follow up of diabetes. Patient also reports that he has been having increasing left eye pain that worsens during dialysis. He has had to stop dialysis early sometimes 2/2 sx. He reports some visual changes when the pain is severe. This visit was aided by an interpretor.  ? ?Past Medical History:  ?Diagnosis Date  ? Acute bilateral low back pain 01/03/2021  ? AIDS ([place]) 11/22/2014  ? Back pain 06/09/2019  ? BKA stump complication ([place]) 8/52/7782  ? Chronic diarrhea   ? Chronic hepatitis C without hepatic coma ([place]) 11/22/2014  ? 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[place])   ? Right  ? Diabetic neuropathy ([place])   ? ESRD (end stage renal disease) on dialysis [facility])   ? "TTS; don't remember street name" (05/03/2014)  ? Hepatitis C   ? HIV INFECTION 06/27/2010  ? Qualifier: Diagnosis of  By: Nickola Major CMA Deborra Medina), Geni Bers    ? Hypotension 06/02/2012  ? Metabolic bone disease 08/13/3534  ? MRSA infection   ? Normocytic anemia 06/17/2012  ? Pancreatitis   ? Pressure ulcer of BKA stump, stage 2 ([place]) 11/22/2014  ? Renal disorder   ? Renal insufficiency   ? Severe protein-calorie malnutrition ([place]) 06/17/2012  ? Uncontrolled diabetes mellitus with complications 1/44/3154  ? Annotation: uncontrolled Qualifier: Diagnosis of  By: Nickola Major CMA ( [place]), Geni Bers    ? ? ?Past Surgical History:  ?Procedure Laterality Date  ? AMPUTATION Left 04/20/2014  ? Procedure: 3rd toe amputation, 4th Toe Amputation,  5th Toe Amputation;  Surgeon: Newt Minion, MD;  Location: [place];  Service: Orthopedics;  Laterality: Left;  ? AMPUTATION Left 05/02/2014  ? Procedure: Midfoot Amputation;  Surgeon: Newt Minion, MD;  Location: [place];  Service: Orthopedics;  Laterality: Left;  ? AMPUTATION Left 06/17/2014  ?  Procedure: AMPUTATION BELOW KNEE;  Surgeon: Newt Minion, MD;  Location: [place];  Service: Orthopedics;  Laterality: Left;  ? AMPUTATION Right 09/04/2018  ? Procedure: RIGHT BELOW KNEE AMPUTATION;  Surgeon: Newt Minion, MD;  Location: [facility];  Service: Orthopedics;  Laterality: Right;  RIGHT BELOW KNEE AMPUTATION  ? AV FISTULA PLACEMENT Left   ? AV FISTULA PLACEMENT Left 05/10/2016  ? Procedure: Creation Left Arm Brachiocephalic Arteriovenous Fistula and Ligation of Radiocephalic Fistula;  Surgeon: Angelia Mould, MD;  Location: [facility];  Service: Vascular;  Laterality: Left;  ? FEMUR IM NAIL Left 08/17/2016  ? Procedure: INTRAMEDULLARY (IM) RETROGRADE FEMORAL NAILING;  Surgeon: Marybelle Killings, MD;  Location: [place];  Service: Orthopedics;  Laterality: Left;  ? FOOT AMPUTATION THROUGH ANKLE Left 12/'21/2015  ? midfoot  ? IR GENERIC HISTORICAL Left 05/01/2016  ? IR THROMBECTOMY AV FISTULA W/THROMBOLYSIS/PTA INC/SHUNT/IMG LEFT 05/01/2016 [name], MD [facility]  ? IR GENERIC HISTORICAL  05/01/2016  ? IR US GUIDE VASC ACCESS LEFT 05/01/2016 [name], MD [facility]  ? IR GENERIC HISTORICAL  05/07/2016  ? IR FLUORO GUIDE CV LINE RIGHT 05/07/2016 Corrie Mckusick, DO [facility]  ? IR GENERIC HISTORICAL  05/07/2016  ? IR US GUIDE VASC ACCESS RIGHT 05/07/2016 Corrie Mckusick, DO [facility]  ? IR GENERIC HISTORICAL  05/22/2016  ? IR US GUIDE VASC ACCESS RIGHT 05/22/2016 Greggory Keen, MD [facility]  ? IR GENERIC HISTORICAL  05/22/2016  ? IR FLUORO GUIDE CV LINE RIGHT 05/22/2016 Greggory Keen, MD [facility]  ? IR REMOVAL TUN CV CATH W/O [place]  08/21/2016  ? PERIPHERAL VASCULAR CATHETERIZATION Left 05/09/2016  ? Procedure: A/V Fistulagram;  Surgeon: Angelia Mould, MD;  Location: [place] CV LAB;  Service: Cardiovascular;  Laterality: Left;  arm  ? TEE WITHOUT CARDIOVERSION N/A 06/22/2018  ? Procedure: TRANSESOPHAGEAL ECHOCARDIOGRAM (TEE);  Surgeon: Jerline Pain, MD;  Location: [facility] ENDOSCOPY;   Service: Cardiovascular;  Laterality: N/A;  ? ? ?Family History  ?Problem Relation Age of Onset  ? Diabetes Mother   ? Diabetes Father   ? ? ?Social History  ? ?Socioeconomic History  ? Marital status: Single  ?  Spouse name: Not on file  ? Number of children: Not on file  ? Years of education: Not on file  ? Highest education level: Not on file  ?Occupational History  ? Not on file  ?Tobacco Use  ? Smoking status: Never  ? Smokeless tobacco: Never  ?Vaping Use  ? Vaping Use: Never used  ?Substance and Sexual Activity  ? Alcohol use: No  ? Drug use: No  ? Sexual activity: Not Currently  ?  Comment: declined condoms  ?Other Topics Concern  ? Not on file  ?Social History Narrative  ? ** Merged History Encounter **  ?    ? ?Social Determinants of Health  ? ?Financial Resource Strain: Not on file  ?Food Insecurity: Not on file  ?Transportation Needs: Not on file  ?Physical Activity: Not on file  ?Stress: Not on file  ?Social Connections: Not on file  ?Intimate Partner Violence: Not on file  ? ? ?ROS ?Review of Systems  ?Eyes:  Positive for pain and visual disturbance.  ?All other systems reviewed and are negative. ? ?Objective:  ? ?Today's Vitals: BP 106/70   Pulse 88   Temp 98.7 ?F (37.1 ?C) (Oral)   Resp 16   Wt 114 lb 6.4 oz (51.9 kg)   SpO2 98%   BMI 20.27 kg/m?  ? ?Physical Exam ?Vitals and nursing note reviewed.  ?Constitutional:   ?   General: He is not in acute distress. ?Cardiovascular:  ?   Rate and Rhythm: Normal rate and regular rhythm.  ?Pulmonary:  ?   Effort: Pulmonary effort is normal.  ?   Breath sounds: Normal breath sounds.  ?Abdominal:  ?   Palpations: Abdomen is soft.  ?   Tenderness: There is no abdominal tenderness.  ?Musculoskeletal:  ?   Right Lower Extremity: Right leg is amputated below knee.  ?   Left Lower Extremity: Left leg is amputated below knee.  ?Neurological:  ?   General: No focal deficit present.  ?   Mental Status: He is alert and oriented to person, place, and time.   ? ? ?Assessment & Plan:  ? ?1. Type 2 diabetes mellitus with other specified complication, with long-term current use of insulin ([place]) ?Continue present management and monitor ? ?- POCT glycosylated hemoglobin (Hb A1C) ?- Ambulatory referral to Ophthalmology ? ?2. Left eye pain ?Referral to specialist for further eval/mgt ? ?- Ambulatory referral to Ophthalmology ? ? ? ?Outpatient Encounter Medications as of 07/16/2021  ?Medication Sig  ? bictegravir-emtricitabine-tenofovir AF (BIKTARVY) 50-200-25 MG TABS tablet Take 1 tablet by mouth daily.  ? Blood Glucose Monitoring Suppl (TRUE METRIX METER) DEVI 1 each by Does not apply route 3 (three) times daily.  ? glucose  blood (TRUE METRIX BLOOD GLUCOSE TEST) test strip 1 each by Other route 3 (three) times daily.  ? insulin NPH-regular Human (70-30) 100 UNIT/ML injection INJECT 15 UNITS INTO THE SKIN 2 (TWO) TIMES DAILY BEFORE A MEAL.  ? Insulin Syringe-Needle U-100 (BD INSULIN SYRINGE ULTRAFINE) 31G X 15/64" 0.5 ML MISC 1 each by Does not apply route 2 (two) times daily.  ? midodrine (PROAMATINE) 10 MG tablet Take 1 tablet (10 mg total) by mouth 3 (three) times daily with meals.  ? TRUEplus Lancets 28G MISC 1 each by Does not apply route 3 (three) times daily.  ? ?No facility-administered encounter medications on file as of 07/16/2021.  ? ? ?Follow-up: Return in about 4 months (around 11/15/2021) for follow up.  ? ?Becky Sax, MD ? ?

## 2021-08-13 ENCOUNTER — Other Ambulatory Visit: Payer: Self-pay

## 2021-08-15 ENCOUNTER — Other Ambulatory Visit: Payer: Self-pay

## 2021-08-22 ENCOUNTER — Other Ambulatory Visit (HOSPITAL_COMMUNITY): Payer: Self-pay

## 2021-08-22 ENCOUNTER — Other Ambulatory Visit: Payer: Self-pay

## 2021-09-13 ENCOUNTER — Other Ambulatory Visit: Payer: Self-pay

## 2021-09-19 ENCOUNTER — Other Ambulatory Visit: Payer: Self-pay

## 2021-09-25 ENCOUNTER — Encounter: Payer: Self-pay | Admitting: Internal Medicine

## 2021-10-24 ENCOUNTER — Other Ambulatory Visit: Payer: Self-pay

## 2021-10-24 ENCOUNTER — Other Ambulatory Visit: Payer: Self-pay | Admitting: Family Medicine

## 2021-10-24 MED ORDER — HUMULIN 70/30 (70-30) 100 UNIT/ML ~~LOC~~ SUSP
SUBCUTANEOUS | 1 refills | Status: DC
  Filled 2021-10-24: qty 10, 31d supply, fill #0
  Filled 2021-11-19: qty 10, 31d supply, fill #1
  Filled 2021-12-27: qty 10, 31d supply, fill #2

## 2021-10-24 NOTE — Telephone Encounter (Signed)
Requested Prescriptions  Pending Prescriptions Disp Refills  . insulin NPH-regular Human (HUMULIN 70/30) (70-30) 100 UNIT/ML injection 30 mL 1    Sig: INJECT 15 UNITS INTO THE SKIN 2 (TWO) TIMES DAILY BEFORE A MEAL.     Endocrinology:  Diabetes - Insulins Passed - 10/24/2021  2:15 PM      Passed - HBA1C is between 0 and 7.9 and within 180 days    Hemoglobin A1C  Date Value Ref Range Status  07/16/2021 7.4 (A) 4.0 - 5.6 % Final   HbA1c, POC (controlled diabetic range)  Date Value Ref Range Status  09/28/2018 6.8 0.0 - 7.0 % Final   Hgb A1c MFr Bld  Date Value Ref Range Status  12/09/2020 8.2 (H) 4.8 - 5.6 % Final    Comment:    (NOTE)         Prediabetes: 5.7 - 6.4         Diabetes: >6.4         Glycemic control for adults with diabetes: <7.0          Passed - Valid encounter within last 6 months    Recent Outpatient Visits          3 months ago Type 2 diabetes mellitus with other specified complication, with long-term current use of insulin ([place])   Primary Care at [facility], Clyde Canterbury, MD   9 months ago Type 2 diabetes mellitus with other specified complication, with long-term current use of insulin [facility])   Primary Care at [facility], MD   2 years ago Hx MRSA infection   [place], Jeneen Rinks, MD   3 years ago Diabetes mellitus with ESRD (end-stage renal disease) ([place])   [place], Charlane Ferretti, MD   3 years ago Diabetes mellitus with ESRD (end-stage renal disease) [facility])   [place], Deborah B, MD      Future Appointments            In 4 weeks Dorna Mai, MD Primary Care at [facility]   In 2 months Tommy Medal, Lavell Islam, MD [facility]

## 2021-11-19 ENCOUNTER — Other Ambulatory Visit: Payer: Self-pay

## 2021-11-21 ENCOUNTER — Ambulatory Visit (INDEPENDENT_AMBULATORY_CARE_PROVIDER_SITE_OTHER): Payer: Self-pay | Admitting: Family Medicine

## 2021-11-21 ENCOUNTER — Other Ambulatory Visit: Payer: Self-pay

## 2021-11-21 ENCOUNTER — Encounter: Payer: Self-pay | Admitting: Family Medicine

## 2021-11-21 VITALS — BP 126/78 | HR 87 | Temp 98.1°F | Resp 16 | Wt 114.0 lb

## 2021-11-21 DIAGNOSIS — Z89512 Acquired absence of left leg below knee: Secondary | ICD-10-CM

## 2021-11-21 DIAGNOSIS — E1169 Type 2 diabetes mellitus with other specified complication: Secondary | ICD-10-CM

## 2021-11-21 DIAGNOSIS — B20 Human immunodeficiency virus [HIV] disease: Secondary | ICD-10-CM

## 2021-11-21 DIAGNOSIS — Z794 Long term (current) use of insulin: Secondary | ICD-10-CM

## 2021-11-21 DIAGNOSIS — Z789 Other specified health status: Secondary | ICD-10-CM

## 2021-11-21 DIAGNOSIS — Z89511 Acquired absence of right leg below knee: Secondary | ICD-10-CM

## 2021-11-21 LAB — POCT GLYCOSYLATED HEMOGLOBIN (HGB A1C): Hemoglobin A1C: 10 % — AB (ref 4.0–5.6)

## 2021-11-21 NOTE — Progress Notes (Signed)
Patient is here for f/u DM.Patient is concern about his declining eye vision.

## 2021-11-23 NOTE — Progress Notes (Signed)
Established Patient Office Visit  Subjective    Patient ID: Alvin Daniels, male    DOB: Oct 02, 1970  Age: 51 y.o. MRN: [id number]  CC:  Chief Complaint  Patient presents with   Follow-up    HPI Alvin Daniels presents for routine follow up of diabetes. This visit was aided by an interpreter.    Outpatient Encounter Medications as of 11/21/2021  Medication Sig   bictegravir-emtricitabine-tenofovir AF (BIKTARVY) 50-200-25 MG TABS tablet Take 1 tablet by mouth daily.   Blood Glucose Monitoring Suppl (TRUE METRIX METER) DEVI 1 each by Does not apply route 3 (three) times daily.   FOSRENOL 500 MG chewable tablet Chew 1,000 mg by mouth 3 (three) times daily.   glucose blood (TRUE METRIX BLOOD GLUCOSE TEST) test strip 1 each by Other route 3 (three) times daily.   insulin NPH-regular Human (HUMULIN 70/30) (70-30) 100 UNIT/ML injection INJECT 15 UNITS INTO THE SKIN 2 (TWO) TIMES DAILY BEFORE A MEAL.   Insulin Syringe-Needle U-100 (BD INSULIN SYRINGE ULTRAFINE) 31G X 15/64" 0.5 ML MISC 1 each by Does not apply route 2 (two) times daily.   iron sucrose in sodium chloride 0.9 % 100 mL Iron Sucrose (Venofer)   lanthanum (FOSRENOL) 500 MG chewable tablet Chew by mouth.   LOPERAMIDE HCL PO Take by mouth.   Methoxy PEG-Epoetin Beta (MIRCERA IJ) Mircera   midodrine (PROAMATINE) 10 MG tablet Take 1 tablet (10 mg total) by mouth 3 (three) times daily with meals.   TRUEplus Lancets 28G MISC 1 each by Does not apply route 3 (three) times daily.   No facility-administered encounter medications on file as of 11/21/2021.    Past Medical History:  Diagnosis Date   Acute bilateral low back pain 01/03/2021   AIDS ([place]) 11/22/2014   Back pain 06/09/2019   BKA stump complication ([place]) 12/24/4816   Chronic diarrhea    Chronic hepatitis C without hepatic coma ([place]) 11/22/2014   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[place])    Right   Diabetic neuropathy ([place])    ESRD (end stage renal disease) on  dialysis ([place])    "TTS; don't remember street name" (05/03/2014)   Hepatitis C    HIV INFECTION 06/27/2010   Qualifier: Diagnosis of  By: Nickola Major CMA ( AAMA), Jacqueline     Hypotension 5/63/1497   Metabolic bone disease 0/06/6376   MRSA infection    Normocytic anemia 06/17/2012   Pancreatitis    Pressure ulcer of BKA stump, stage 2 ([name]) 11/22/2014   Renal disorder    Renal insufficiency    Severe protein-calorie malnutrition ([place]) 06/17/2012   Uncontrolled diabetes mellitus with complications 5/88/5027   Annotation: uncontrolled Qualifier: Diagnosis of  By: Nickola Major CMA Deborra Medina), Geni Bers      Past Surgical History:  Procedure Laterality Date   AMPUTATION Left 04/20/2014   Procedure: 3rd toe amputation, 4th Toe Amputation,  5th Toe Amputation;  Surgeon: Newt Minion, MD;  Location: [facility];  Service: Orthopedics;  Laterality: Left;   AMPUTATION Left 05/02/2014   Procedure: Midfoot Amputation;  Surgeon: Newt Minion, MD;  Location: [facility];  Service: Orthopedics;  Laterality: Left;   AMPUTATION Left 06/17/2014   Procedure: AMPUTATION BELOW KNEE;  Surgeon: Newt Minion, MD;  Location: [facility];  Service: Orthopedics;  Laterality: Left;   AMPUTATION Right 09/04/2018   Procedure: RIGHT BELOW KNEE AMPUTATION;  Surgeon: Newt Minion, MD;  Location: [facility];  Service: Orthopedics;  Laterality: Right;  RIGHT BELOW KNEE AMPUTATION  AV FISTULA PLACEMENT Left    AV FISTULA PLACEMENT Left 05/10/2016   Procedure: Creation Left Arm Brachiocephalic Arteriovenous Fistula and Ligation of Radiocephalic Fistula;  Surgeon: Angelia Mould, MD;  Location: [facility];  Service: Vascular;  Laterality: Left;   FEMUR IM NAIL Left 08/17/2016   Procedure: INTRAMEDULLARY (IM) RETROGRADE FEMORAL NAILING;  Surgeon: Marybelle Killings, MD;  Location: [facility];  Service: Orthopedics;  Laterality: Left;   FOOT AMPUTATION THROUGH ANKLE Left 12/'21/2015   midfoot   IR GENERIC HISTORICAL Left 05/01/2016   IR THROMBECTOMY AV FISTULA  W/THROMBOLYSIS/PTA INC/SHUNT/IMG LEFT 05/01/2016 [name], MD [facility]   IR GENERIC HISTORICAL  05/01/2016   IR US GUIDE VASC ACCESS LEFT 05/01/2016 [name], MD [facility]   IR GENERIC HISTORICAL  05/07/2016   IR FLUORO GUIDE CV LINE RIGHT 05/07/2016 Corrie Mckusick, DO [facility]   IR GENERIC HISTORICAL  05/07/2016   IR US GUIDE VASC ACCESS RIGHT 05/07/2016 Corrie Mckusick, DO [facility]   IR GENERIC HISTORICAL  05/22/2016   IR US GUIDE VASC ACCESS RIGHT 05/22/2016 Greggory Keen, MD [facility]   IR GENERIC HISTORICAL  05/22/2016   IR FLUORO GUIDE CV LINE RIGHT 05/22/2016 Greggory Keen, MD [facility]   IR REMOVAL TUN CV CATH W/O [facility]  08/21/2016   PERIPHERAL VASCULAR CATHETERIZATION Left 05/09/2016   Procedure: A/V Fistulagram;  Surgeon: Angelia Mould, MD;  Location: [facility];  Service: Cardiovascular;  Laterality: Left;  arm   TEE WITHOUT CARDIOVERSION N/A 06/22/2018   Procedure: TRANSESOPHAGEAL ECHOCARDIOGRAM (TEE);  Surgeon: Jerline Pain, MD;  Location: [facility] ENDOSCOPY;  Service: Cardiovascular;  Laterality: N/A;    Family History  Problem Relation Age of Onset   Diabetes Mother    Diabetes Father     Social History   Socioeconomic History   Marital status: Single    Spouse name: Not on file   Number of children: Not on file   Years of education: Not on file   Highest education level: Not on file  Occupational History   Not on file  Tobacco Use   Smoking status: Never   Smokeless tobacco: Never  Vaping Use   Vaping Use: Never used  Substance and Sexual Activity   Alcohol use: No   Drug use: No   Sexual activity: Not Currently    Comment: declined condoms  Other Topics Concern   Not on file  Social History Narrative   ** Merged History Encounter **       Social Determinants of Health   Financial Resource Strain: Not on file  Food Insecurity: Not on file  Transportation Needs: Not on file  Physical Activity: Not on file   Stress: Not on file  Social Connections: Not on file  Intimate Partner Violence: Not on file    Review of Systems  All other systems reviewed and are negative.       Objective    BP 126/78   Pulse 87   Temp 98.1 F (36.7 C) (Oral)   Resp 16   Wt 114 lb (51.7 kg)   SpO2 98%   BMI 20.20 kg/m   Physical Exam Vitals and nursing note reviewed.  Constitutional:      General: He is not in acute distress. Cardiovascular:     Rate and Rhythm: Normal rate and regular rhythm.  Pulmonary:     Effort: Pulmonary effort is normal.     Breath sounds: Normal breath sounds.  Abdominal:  Palpations: Abdomen is soft.     Tenderness: There is no abdominal tenderness.  Musculoskeletal:     Right Lower Extremity: Right leg is amputated below knee.     Left Lower Extremity: Left leg is amputated below knee.  Neurological:     General: No focal deficit present.     Mental Status: He is alert and oriented to person, place, and time.         Assessment & Plan:   1. Type 2 diabetes mellitus with other specified complication, with long-term current use of insulin ([name]) Increasing A1c and not near goal. Patient defers referral for further eval/mgt. Continue and monitor - POCT glycosylated hemoglobin (Hb A1C)  2. HIV infection, unspecified symptom status ([place]) Management per consultant  3. S/P bilateral BKA (below knee amputation) ([name]) stable  4. Language barrier affecting health care     Return in about 4 months (around 03/24/2022) for follow up.   Becky Sax, MD

## 2021-12-26 NOTE — Progress Notes (Signed)
S:    Alvin Daniels is a 51 y.o. male who presents for diabetes evaluation, education, and management.  PMH is significant for ESRD (dialysis M,W,F), T2DM, hypotension, HIV, and s/p bilateral BKA.  Patient was referred and last seen by Primary Care Provider, Dr. Dorna Mai, on 11/21/21.   At last visit, A1c increasing and not near goal. Pt deferred referral for further eval/management.   Today, patient arrives in good spirits and presents with an interpretor. He seems a bit concerned with previous care he has received with [facility] leading to a bit of hesitancy and mild combative speech during the visit.  Throughout the visit, the patient's story proceeded to change with medications he was prescribed and previous visits he has had. There was confusion from his last visit with Dr. Redmond Pulling. He had believed that a new insulin therapy was started at that time. Explained to the pt that he had been referred to pharmacy for management of his insulin dosing. He states he does not feel that his current insulin therapy is working for him. Confirmed he was still using the Humulin at home. The pt reiterated throughout the visit when asked about his diet, home BG levels, and medication adherence that he has been "a diabetic for over 20 years."  Family/Social History:  -FH: DM -Tobacco: denies -Alcohol: denies  Current diabetes medications include: Humulin 70/30 15 units BID   Patient reports taking Humulin 70/30 12 units BID. He has not been taking his BG levels at home in quite some time.  Do you feel that your medications are working for you? no Have you been experiencing any side effects to the medications prescribed? no Do you have any problems obtaining medications due to transportation or finances? no Insurance coverage: none  Patient reports hypoglycemic events if he does not eat after taking his insulin. He does not take BG levels at home.   Patient denies nocturia (nighttime  urination).  Patient denies neuropathy (nerve pain). Patient reports visual changes. Sometimes   Patient reported dietary habits: Eats eggs, beans, sandwiches, fish. He denies eating sugary snacks and drinks  O:  Lab Results  Component Value Date   HGBA1C 10.0 (A) 11/21/2021   There were no vitals filed for this visit.  Lipid Panel     Component Value Date/Time   CHOL 145 06/06/2021 1452   TRIG 337 (H) 06/06/2021 1452   HDL 36 (L) 06/06/2021 1452   CHOLHDL 4.0 06/06/2021 1452   VLDL 13 06/13/2014 1406   LDLCALC 68 06/06/2021 1452    Clinical Atherosclerotic Cardiovascular Disease (ASCVD): No  The 10-year ASCVD risk score (Arnett DK, et al., 2019) is: 6.4%   Values used to calculate the score:     Age: 37 years     Sex: Male     Is Non-Hispanic African American: No     Diabetic: Yes     Tobacco smoker: No     Systolic Blood Pressure: 956 mmHg     Is BP treated: No     HDL Cholesterol: 36 mg/dL     Total Cholesterol: 145 mg/dL   A/P: Diabetes longstanding currently uncontrolled. Patient is able to verbalize appropriate hypoglycemia management plan. Medication adherence appears inadequate at times. Control is suboptimal due to some confusion with the regimen. -Discontinued Humulin 70/30 and initiated Basaglar 16 units once daily. -Patient educated on purpose, proper use, and potential adverse effects of insulin.  -Extensively discussed pathophysiology of diabetes, recommended lifestyle interventions, dietary effects  on blood sugar control.  -Counseled on s/sx of and management of hypoglycemia.  -Next A1c anticipated in October.  -Sent in test strips and a meter to the pharmacy and encouraged the patient to take his BG readings daily and return with a BG log. Explained how his CKD diagnosis could make his A1c readings not as reliable. Will attempt to add statin therapy at next visit.   ASCVD risk - primary prevention in patient with diabetes. Last LDL is 68 at goal of <70   mg/dL. ASCVD risk factors include T2DM and 10-year ASCVD risk score of 6.4. Moderate intensity statin indicated.  -Given the complexity of his diabetes disease state, priority given diabetes management.   Written patient instructions provided. Patient verbalized understanding of treatment plan.  Total time in face to face counseling 50 minutes.    Follow-up:  Pharmacist 9/21. PCP clinic visit in 11/15.   Lind Guest, PharmD PGY-1 [facility] Pharmacy Resident 12/27/2021 11:53 AM

## 2021-12-27 ENCOUNTER — Ambulatory Visit: Payer: Self-pay | Attending: Family Medicine | Admitting: Pharmacist

## 2021-12-27 ENCOUNTER — Other Ambulatory Visit: Payer: Self-pay

## 2021-12-27 DIAGNOSIS — N186 End stage renal disease: Secondary | ICD-10-CM

## 2021-12-27 DIAGNOSIS — E1122 Type 2 diabetes mellitus with diabetic chronic kidney disease: Secondary | ICD-10-CM

## 2021-12-27 MED ORDER — TRUE METRIX METER W/DEVICE KIT
1.0000 | PACK | Freq: Three times a day (TID) | 0 refills | Status: DC
  Filled 2021-12-27: qty 1, 30d supply, fill #0

## 2021-12-27 MED ORDER — TRUE METRIX BLOOD GLUCOSE TEST VI STRP
1.0000 | ORAL_STRIP | Freq: Three times a day (TID) | 2 refills | Status: DC
  Filled 2021-12-27: qty 100, 34d supply, fill #0
  Filled 2022-05-14: qty 100, 34d supply, fill #1

## 2021-12-27 MED ORDER — TRUEPLUS LANCETS 28G MISC
1.0000 | Freq: Three times a day (TID) | 2 refills | Status: DC
  Filled 2021-12-27: qty 100, 33d supply, fill #0
  Filled 2022-05-14: qty 100, 33d supply, fill #1

## 2021-12-27 MED ORDER — BASAGLAR KWIKPEN 100 UNIT/ML ~~LOC~~ SOPN
16.0000 [IU] | PEN_INJECTOR | Freq: Every day | SUBCUTANEOUS | 2 refills | Status: DC
  Filled 2021-12-27: qty 6, 37d supply, fill #0
  Filled 2022-02-14: qty 6, 37d supply, fill #1
  Filled 2022-05-14: qty 6, 37d supply, fill #2

## 2022-01-02 ENCOUNTER — Ambulatory Visit (INDEPENDENT_AMBULATORY_CARE_PROVIDER_SITE_OTHER): Payer: Self-pay | Admitting: Infectious Disease

## 2022-01-02 ENCOUNTER — Other Ambulatory Visit: Payer: Self-pay

## 2022-01-02 ENCOUNTER — Encounter: Payer: Self-pay | Admitting: Infectious Disease

## 2022-01-02 VITALS — BP 121/77 | HR 97 | Temp 97.8°F | Ht 62.99 in | Wt 111.0 lb

## 2022-01-02 DIAGNOSIS — E1122 Type 2 diabetes mellitus with diabetic chronic kidney disease: Secondary | ICD-10-CM

## 2022-01-02 DIAGNOSIS — I9589 Other hypotension: Secondary | ICD-10-CM

## 2022-01-02 DIAGNOSIS — N186 End stage renal disease: Secondary | ICD-10-CM

## 2022-01-02 DIAGNOSIS — B20 Human immunodeficiency virus [HIV] disease: Secondary | ICD-10-CM

## 2022-01-02 DIAGNOSIS — Z89512 Acquired absence of left leg below knee: Secondary | ICD-10-CM

## 2022-01-02 DIAGNOSIS — Z113 Encounter for screening for infections with a predominantly sexual mode of transmission: Secondary | ICD-10-CM

## 2022-01-02 DIAGNOSIS — Z89511 Acquired absence of right leg below knee: Secondary | ICD-10-CM

## 2022-01-02 MED ORDER — PITAVASTATIN MAGNESIUM 4 MG PO TABS
4.0000 mg | ORAL_TABLET | Freq: Every day | ORAL | 11 refills | Status: DC
Start: 2022-01-02 — End: 2022-05-29

## 2022-01-02 MED ORDER — MIDODRINE HCL 10 MG PO TABS
10.0000 mg | ORAL_TABLET | Freq: Three times a day (TID) | ORAL | 11 refills | Status: DC
  Filled 2022-01-02: qty 90, 30d supply, fill #0

## 2022-01-02 MED ORDER — MIDODRINE HCL 10 MG PO TABS
10.0000 mg | ORAL_TABLET | Freq: Three times a day (TID) | ORAL | 11 refills | Status: DC
Start: 2022-01-02 — End: 2023-01-27
  Filled 2022-01-02 – 2022-06-18 (×2): qty 90, 30d supply, fill #0
  Filled 2022-09-17: qty 90, 30d supply, fill #1
  Filled 2022-11-18 – 2022-11-25 (×2): qty 90, 30d supply, fill #2

## 2022-01-02 MED ORDER — BIKTARVY 50-200-25 MG PO TABS: 1.0000 | ORAL_TABLET | Freq: Every day | ORAL | 11 refills | Status: DC

## 2022-01-02 NOTE — Progress Notes (Signed)
Subjective:  Chief complaint: Follow-up for HIV disease on medications  Patient ID: Alvin Daniels, male    DOB: February 28, 1971, 51 y.o.   MRN: [id number]  HPI   Note care provided with in person Spanish translator  51 year old Hispanic man living with HIV that has been perfectly controlled on Biktarvy. He has had bilateral BKA'S with problems tin July 2021 with MRSA bacteremia due to stump infection that was debrided. He could not get TEE due to low blood pressure.   He was continuing to have trouble with chronic hypotension having difficulty obtaining midodrine due to price.  I last saw him he was on midodrine 10 mg 3 times daily but only taking it on dialysis days due to a miscommunication about how he could take the pills and how many pills he had.  He continuedto have some ulcers bilaterally at his BKA sites due to poor fitting prosthesis and inability to afford new prosthesis.  He was applying a topical antibiotic that he purchased in [place].  Since the last time he seen Dr. Sharol Given in follow-up who provided him with new socket liner materials and supplies. Thankfully he even has new prostheses bilaterally his wounds have now healed up which is greatly reassuring.  He establish care with [facility] who are prescribing him most of his medications besides the BIKTARVY and the midodrine.  However says he has found another doctor on ID 5 who speaks Spanish and who is helping him out better he is complaining about the cost of multiple medications though many of them would be covered by the HIV medication assistance program (zero copays) and/or [place]  In those lines I also prescribed midodrine and the dispensing seems to indicate that he is receiving it but he claims he is only receiving 6 beats per week and so he is only taking it with dialysis.  His blood pressure is certainly better today.     Past Medical History:  Diagnosis Date    Acute bilateral low back pain 01/03/2021   AIDS ([place]) 11/22/2014   Back pain 06/09/2019   BKA stump complication ([place]) 12/05/3662   Chronic diarrhea    Chronic hepatitis C without hepatic coma ([place]) 11/22/2014   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[place])    Right   Diabetic neuropathy ([place])    ESRD (end stage renal disease) on dialysis ([place])    "TTS; don't remember street name" (05/03/2014)   Hepatitis C    HIV INFECTION 06/27/2010   Qualifier: Diagnosis of  By: Nickola Major CMA ( AAMA), Jacqueline     Hypotension 08/13/4740   Metabolic bone disease 09/18/5636   MRSA infection    Normocytic anemia 06/17/2012   Pancreatitis    Pressure ulcer of BKA stump, stage 2 ([place]) 11/22/2014   Renal disorder    Renal insufficiency    Severe protein-calorie malnutrition ([place]) 06/17/2012   Uncontrolled diabetes mellitus with complications 7/56/4332   Annotation: uncontrolled Qualifier: Diagnosis of  By: Nickola Major CMA Deborra Medina), Geni Bers      Past Surgical History:  Procedure Laterality Date   AMPUTATION Left 04/20/2014   Procedure: 3rd toe amputation, 4th Toe Amputation,  5th Toe Amputation;  Surgeon: Newt Minion, MD;  Location: [facility];  Service: Orthopedics;  Laterality: Left;   AMPUTATION Left 05/02/2014   Procedure: Midfoot Amputation;  Surgeon: Newt Minion, MD;  Location: [facility];  Service: Orthopedics;  Laterality: Left;   AMPUTATION Left 06/17/2014   Procedure: AMPUTATION  BELOW KNEE;  Surgeon: Newt Minion, MD;  Location: [facility];  Service: Orthopedics;  Laterality: Left;   AMPUTATION Right 09/04/2018   Procedure: RIGHT BELOW KNEE AMPUTATION;  Surgeon: Newt Minion, MD;  Location: [facility];  Service: Orthopedics;  Laterality: Right;  RIGHT BELOW KNEE AMPUTATION   AV FISTULA PLACEMENT Left    AV FISTULA PLACEMENT Left 05/10/2016   Procedure: Creation Left Arm Brachiocephalic Arteriovenous Fistula and Ligation of Radiocephalic Fistula;  Surgeon: Angelia Mould, MD;  Location: [facility];  Service: Vascular;   Laterality: Left;   FEMUR IM NAIL Left 08/17/2016   Procedure: INTRAMEDULLARY (IM) RETROGRADE FEMORAL NAILING;  Surgeon: Marybelle Killings, MD;  Location: [facility];  Service: Orthopedics;  Laterality: Left;   FOOT AMPUTATION THROUGH ANKLE Left 12/'21/2015   midfoot   IR GENERIC HISTORICAL Left 05/01/2016   IR THROMBECTOMY AV FISTULA W/THROMBOLYSIS/PTA INC/SHUNT/IMG LEFT 05/01/2016 [name], MD [facility]   IR GENERIC HISTORICAL  05/01/2016   IR US GUIDE VASC ACCESS LEFT 05/01/2016 [name], MD [facility]   IR GENERIC HISTORICAL  05/07/2016   IR FLUORO GUIDE CV LINE RIGHT 05/07/2016 Corrie Mckusick, DO [facility]   IR GENERIC HISTORICAL  05/07/2016   IR US GUIDE VASC ACCESS RIGHT 05/07/2016 Corrie Mckusick, DO [facility]   IR GENERIC HISTORICAL  05/22/2016   IR US GUIDE VASC ACCESS RIGHT 05/22/2016 Greggory Keen, MD [facility]   IR GENERIC HISTORICAL  05/22/2016   IR FLUORO GUIDE CV LINE RIGHT 05/22/2016 Greggory Keen, MD [facility]   IR REMOVAL TUN CV CATH W/O [facility]  08/21/2016   PERIPHERAL VASCULAR CATHETERIZATION Left 05/09/2016   Procedure: A/V Fistulagram;  Surgeon: Angelia Mould, MD;  Location: [place] [facility];  Service: Cardiovascular;  Laterality: Left;  arm   TEE WITHOUT CARDIOVERSION N/A 06/22/2018   Procedure: TRANSESOPHAGEAL ECHOCARDIOGRAM (TEE);  Surgeon: Jerline Pain, MD;  Location: [facility] ENDOSCOPY;  Service: Cardiovascular;  Laterality: N/A;    Family History  Problem Relation Age of Onset   Diabetes Mother    Diabetes Father       Social History   Socioeconomic History   Marital status: Single    Spouse name: Not on file   Number of children: Not on file   Years of education: Not on file   Highest education level: Not on file  Occupational History   Not on file  Tobacco Use   Smoking status: Never   Smokeless tobacco: Never  Vaping Use   Vaping Use: Never used  Substance and Sexual Activity   Alcohol use: No   Drug use: No   Sexual  activity: Not Currently    Comment: declined condoms  Other Topics Concern   Not on file  Social History Narrative   ** Merged History Encounter **       Social Determinants of Health   Financial Resource Strain: Not on file  Food Insecurity: Not on file  Transportation Needs: Not on file  Physical Activity: Not on file  Stress: Not on file  Social Connections: Not on file    No Known Allergies   Current Outpatient Medications:    bictegravir-emtricitabine-tenofovir AF (BIKTARVY) 50-200-25 MG TABS tablet, Take 1 tablet by mouth daily., Disp: 30 tablet, Rfl: 11   Blood Glucose Monitoring Suppl (TRUE METRIX METER) w/Device KIT, Use kit to check glucose 3 (three) times daily., Disp: 1 kit, Rfl: 0   FOSRENOL 500 MG chewable tablet, Chew 1,000 mg by mouth  3 (three) times daily., Disp: , Rfl:    glucose blood (TRUE METRIX BLOOD GLUCOSE TEST) test strip, use 1 test strip to check glucose 3 (three) times daily., Disp: 100 each, Rfl: 2   Insulin Glargine (BASAGLAR KWIKPEN) 100 UNIT/ML, Inject 16 Units into the skin daily., Disp: 6 mL, Rfl: 2   Insulin Syringe-Needle U-100 (BD INSULIN SYRINGE ULTRAFINE) 31G X 15/64" 0.5 ML MISC, 1 each by Does not apply route 2 (two) times daily., Disp: 100 each, Rfl: 5   iron sucrose in sodium chloride 0.9 % 100 mL, Iron Sucrose (Venofer), Disp: , Rfl:    lanthanum (FOSRENOL) 500 MG chewable tablet, Chew by mouth., Disp: , Rfl:    LOPERAMIDE HCL PO, Take by mouth., Disp: , Rfl:    Methoxy PEG-Epoetin Beta (MIRCERA IJ), Mircera, Disp: , Rfl:    midodrine (PROAMATINE) 10 MG tablet, Take 1 tablet (10 mg total) by mouth 3 (three) times daily with meals., Disp: 90 tablet, Rfl: 11   TRUEplus Lancets 28G MISC, Use to test 3 (three) times daily., Disp: 100 each, Rfl: 2\  Review of Systems  Constitutional:  Negative for activity change, appetite change, chills, diaphoresis, fatigue, fever and unexpected weight change.  HENT:  Negative for congestion, rhinorrhea,  sinus pressure, sneezing, sore throat and trouble swallowing.   Eyes:  Negative for photophobia and visual disturbance.  Respiratory:  Negative for cough, chest tightness, shortness of breath, wheezing and stridor.   Cardiovascular:  Negative for chest pain, palpitations and leg swelling.  Gastrointestinal:  Negative for abdominal distention, abdominal pain, anal bleeding, blood in stool, constipation, diarrhea, nausea and vomiting.  Genitourinary:  Negative for difficulty urinating, dysuria, flank pain and hematuria.  Musculoskeletal:  Negative for arthralgias, back pain, gait problem, joint swelling and myalgias.  Skin:  Negative for color change, pallor, rash and wound.  Neurological:  Negative for dizziness, tremors, weakness and light-headedness.  Hematological:  Negative for adenopathy. Does not bruise/bleed easily.  Psychiatric/Behavioral:  Negative for agitation, behavioral problems, confusion, decreased concentration, dysphoric mood and sleep disturbance.        Objective:   Physical Exam Constitutional:      Appearance: He is well-developed.  HENT:     Head: Normocephalic and atraumatic.  Eyes:     Conjunctiva/sclera: Conjunctivae normal.  Cardiovascular:     Rate and Rhythm: Normal rate and regular rhythm.  Pulmonary:     Effort: Pulmonary effort is normal. No respiratory distress.     Breath sounds: No wheezing.  Abdominal:     General: There is no distension.     Palpations: Abdomen is soft.  Musculoskeletal:        General: No tenderness. Normal range of motion.     Cervical back: Normal range of motion and neck supple.  Skin:    General: Skin is warm and dry.     Coloration: Skin is not pale.     Findings: No erythema or rash.  Neurological:     General: No focal deficit present.     Mental Status: He is alert and oriented to person, place, and time.  Psychiatric:        Mood and Affect: Mood normal.        Behavior: Behavior normal.        Thought Content:  Thought content normal.        Judgment: Judgment normal.        Assessment & Plan:    HIV disease:  I am rechecking a  viral load and CD4 count CBC  I am continuing his Biktarvy prescription  Lab Results  Component Value Date   HIV1RNAQUANT Not Detected 06/06/2021    Lab Results  Component Value Date   CD4TABS 375 (L) 06/06/2021   CD4TABS 370 (L) 01/03/2021   CD4TABS 363 (L) 08/09/2020    Bilateral skin ulceration at BKA sites these have healed now been fitted with a prosthesis   Hypotension: I sent midodrine and again but he again says that the pharmacist they will only give him 6 tablets I will send a new prescription with instructions for a month supply  End Stage renal disease on hemodialysis  Diabetes mellitus was seeing [facility]. Note there ARE TYPES of Insulin on the [facility] formulary that covered at 0 co-pay but he does not seem to believe me.  He at this time wants to pursue his primary care with the physician on ID 5.  I am certainly comfortable with that ultimately I may consider having him see Terri Piedra who is comfortable rx some insulin (I am not)  Hyperlipidemia: I have sent in pitavastatin to mail order pharmacy

## 2022-01-03 LAB — T-HELPER CELLS (CD4) COUNT (NOT AT ARMC)
CD4 % Helper T Cell: 24 % — ABNORMAL LOW (ref 33–65)
CD4 T Cell Abs: 288 /uL — ABNORMAL LOW (ref 400–1790)

## 2022-01-06 LAB — LIPID PANEL
Cholesterol: 167 mg/dL (ref <200)
HDL: 37 mg/dL — ABNORMAL LOW (ref >=40)
LDL Cholesterol (Calc): 93 mg/dL (calc)
Non-HDL Cholesterol (Calc): 130 mg/dL (calc) — ABNORMAL HIGH (ref <130)
Total CHOL/HDL Ratio: 4.5 (calc) (ref <5.0)
Triglycerides: 259 mg/dL — ABNORMAL HIGH (ref <150)

## 2022-01-06 LAB — SYPHILIS: RPR W/REFLEX TO RPR TITER AND TREPONEMAL ANTIBODIES, TRADITIONAL SCREENING AND DIAGNOSIS ALGORITHM: RPR Ser Ql: NONREACTIVE

## 2022-01-06 LAB — HIV-1 RNA QUANT-NO REFLEX-BLD
HIV 1 RNA Quant: NOT DETECTED Copies/mL
HIV-1 RNA Quant, Log: NOT DETECTED Log cps/mL

## 2022-01-06 LAB — CBC WITH DIFFERENTIAL/PLATELET
Absolute Monocytes: 495 {cells}/uL (ref 200–950)
Basophils Absolute: 99 {cells}/uL (ref 0–200)
Basophils Relative: 1 %
Eosinophils Absolute: 554 {cells}/uL — ABNORMAL HIGH (ref 15–500)
Eosinophils Relative: 5.6 %
HCT: 35.2 % — ABNORMAL LOW (ref 38.5–50.0)
Hemoglobin: 11.9 g/dL — ABNORMAL LOW (ref 13.2–17.1)
Lymphs Abs: 1198 {cells}/uL (ref 850–3900)
MCH: 33 pg (ref 27.0–33.0)
MCHC: 33.8 g/dL (ref 32.0–36.0)
MCV: 97.5 fL (ref 80.0–100.0)
MPV: 10.8 fL (ref 7.5–12.5)
Monocytes Relative: 5 %
Neutro Abs: 7554 {cells}/uL (ref 1500–7800)
Neutrophils Relative %: 76.3 %
Platelets: 154 Thousand/uL (ref 140–400)
RBC: 3.61 Million/uL — ABNORMAL LOW (ref 4.20–5.80)
RDW: 13.6 % (ref 11.0–15.0)
Total Lymphocyte: 12.1 %
WBC: 9.9 Thousand/uL (ref 3.8–10.8)

## 2022-01-30 NOTE — Progress Notes (Deleted)
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 PMH is significant for ESRD (dialysis M,W,F), T2DM, hypotension, HIV, and s/p bilateral BKA.   Patient was referred and last seen by Primary Care Provider, Dr. Dorna Mai, on 11/21/21. At last visit with PCP, A1c increasing and not near goal. However, with his CKD diagnosis, his elevated A1c is not quite an accurate measure of DM control. Pt deferred referral for further evaluation and management.    At last visit with the pharmacy team, the patient's story continued to change with medications he was prescribed and previous visits he has had. There was confusion from his last visit with Dr. Redmond Pulling. He had believed that a new insulin therapy was started at that time, however, it was explained to the pt that he had been referred to pharmacy for management of his insulin dosing. He did not feel that his current insulin regimen was working for him, though he does not take home BG levels. The pt reiterated throughout the visit when asked about his diet, home BG levels, and medication adherence that he had been "a diabetic for over 20 years." Emphasized for the patient to return today with home BG readings.  Today, patient arrives in *** good spirits and presents without *** any assistance. ***  Family/Social History:  -FH: DM -Tobacco: denies -Alcohol: denies  Current diabetes medications include: Basaglar 16 units into the skin once daily  Current hyperlipidemia medication: pitavastatin 4 mg once daily  Patient reports adherence to taking all medications as prescribed.  *** Patient denies adherence with medications, reports missing *** medications *** times per week, on average.  Do you feel that your medications are working for you? {YES NO:22349} Have you been experiencing any side effects to the medications prescribed? {YES NO:22349} Do you have any  problems obtaining medications due to transportation or finances? {YES [id number] Insurance coverage: none  Patient {Actions; denies-reports:120008} hypoglycemic events.  Reported home fasting blood sugars: ***  Reported 2 hour post-meal/random blood sugars: ***.  Patient denies nocturia (nighttime urination).  Patient denies neuropathy (nerve pain). Patient reports visual changes. Sometimes    Patient reported dietary habits: Eats eggs, beans, sandwiches, fish. He denies eating sugary snacks and drinks   O:   Lab Results  Component Value Date   HGBA1C 10.0 (A) 11/21/2021    Lipid Panel     Component Value Date/Time   CHOL 167 01/02/2022 0301   TRIG 259 (H) 01/02/2022 0301   HDL 37 (L) 01/02/2022 0301   CHOLHDL 4.5 01/02/2022 0301   VLDL 13 06/13/2014 1406   LDLCALC 93 01/02/2022 0301    Clinical Atherosclerotic Cardiovascular Disease (ASCVD): No  The 10-year ASCVD risk score (Arnett DK, et al., 2019) is: 7%   Values used to calculate the score:     Age: 27 years     Sex: Male     Is Non-Hispanic African American: No     Diabetic: Yes     Tobacco smoker: No     Systolic Blood Pressure: 967 mmHg     Is BP treated: No     HDL Cholesterol: 37 mg/dL     Total Cholesterol: 167 mg/dL    A/P: Diabetes longstanding *** currently ***. Patient is *** able to verbalize appropriate hypoglycemia management plan. Medication adherence appears ***. Control is suboptimal due to ***. -{Meds adjust:18428} basal insulin *** (insulin ***). Patient will  continue to titrate 1 unit every *** days if fasting blood sugar > 100mg /dl until fasting blood sugars reach goal or next visit.  -{Meds adjust:18428} rapid insulin *** (insulin ***) to ***.  -Patient educated on purpose, proper use, and potential adverse effects of ***.  -Extensively discussed pathophysiology of diabetes, recommended lifestyle interventions, dietary effects on blood sugar control.  -Counseled on s/sx of and management  of hypoglycemia.  -Next A1c anticipated in October.   ASCVD risk - primary prevention in patient with diabetes. Last LDL is not at goal of <70 mg/dL. ASCVD risk factors include DM and 10-year ASCVD risk score of 7. moderate intensity statin indicated.  -Continued pitavastatin 4 mg.   Written patient instructions provided. Patient verbalized understanding of treatment plan.  Total time in face to face counseling *** minutes.    Follow-up:  Pharmacist ***. PCP clinic visit in November.  Patient seen with ID in January.   Maryan Puls, PharmD PGY-1 [facility] Pharmacy Resident

## 2022-01-31 ENCOUNTER — Ambulatory Visit: Payer: Self-pay | Admitting: Pharmacist

## 2022-01-31 ENCOUNTER — Other Ambulatory Visit: Payer: Self-pay

## 2022-02-06 ENCOUNTER — Other Ambulatory Visit (HOSPITAL_COMMUNITY): Payer: Self-pay

## 2022-02-06 ENCOUNTER — Other Ambulatory Visit: Payer: Self-pay

## 2022-02-11 ENCOUNTER — Other Ambulatory Visit: Payer: Self-pay

## 2022-02-11 MED ORDER — MIDODRINE HCL 10 MG PO TABS
ORAL_TABLET | ORAL | 3 refills | Status: DC
  Filled 2022-02-11: qty 60, 30d supply, fill #0

## 2022-02-14 ENCOUNTER — Other Ambulatory Visit: Payer: Self-pay

## 2022-02-18 ENCOUNTER — Other Ambulatory Visit: Payer: Self-pay

## 2022-02-25 ENCOUNTER — Other Ambulatory Visit: Payer: Self-pay

## 2022-02-25 MED ORDER — HUMULIN 70/30 (70-30) 100 UNIT/ML ~~LOC~~ SUSP
SUBCUTANEOUS | 1 refills | Status: DC
  Filled 2022-02-25: qty 10, 31d supply, fill #0

## 2022-02-25 MED ORDER — MIDODRINE HCL 10 MG PO TABS
10.0000 mg | ORAL_TABLET | ORAL | 3 refills | Status: DC
  Filled 2022-02-25: qty 14, 32d supply, fill #0

## 2022-03-04 ENCOUNTER — Other Ambulatory Visit: Payer: Self-pay

## 2022-03-27 ENCOUNTER — Other Ambulatory Visit: Payer: Self-pay

## 2022-03-27 ENCOUNTER — Encounter: Payer: Self-pay | Admitting: Family Medicine

## 2022-03-27 ENCOUNTER — Ambulatory Visit (INDEPENDENT_AMBULATORY_CARE_PROVIDER_SITE_OTHER): Payer: Self-pay | Admitting: Family Medicine

## 2022-03-27 VITALS — BP 136/78 | HR 92 | Temp 98.1°F | Resp 16 | Wt 113.0 lb

## 2022-03-27 DIAGNOSIS — Z789 Other specified health status: Secondary | ICD-10-CM

## 2022-03-27 DIAGNOSIS — E1169 Type 2 diabetes mellitus with other specified complication: Secondary | ICD-10-CM

## 2022-03-27 DIAGNOSIS — Z794 Long term (current) use of insulin: Secondary | ICD-10-CM

## 2022-03-27 DIAGNOSIS — Z89511 Acquired absence of right leg below knee: Secondary | ICD-10-CM

## 2022-03-27 DIAGNOSIS — Z89512 Acquired absence of left leg below knee: Secondary | ICD-10-CM

## 2022-03-27 DIAGNOSIS — I9589 Other hypotension: Secondary | ICD-10-CM

## 2022-03-27 DIAGNOSIS — B20 Human immunodeficiency virus [HIV] disease: Secondary | ICD-10-CM

## 2022-03-27 LAB — POCT GLYCOSYLATED HEMOGLOBIN (HGB A1C): Hemoglobin A1C: 10.2 % — AB (ref 4.0–5.6)

## 2022-03-27 MED ORDER — MIDODRINE HCL 10 MG PO TABS
10.0000 mg | ORAL_TABLET | ORAL | 3 refills | Status: DC
  Filled 2022-03-27: qty 30, 35d supply, fill #0
  Filled 2022-05-14: qty 30, 35d supply, fill #1

## 2022-03-27 MED ORDER — HUMULIN 70/30 (70-30) 100 UNIT/ML ~~LOC~~ SUSP
15.0000 [IU] | Freq: Two times a day (BID) | SUBCUTANEOUS | 5 refills | Status: DC
  Filled 2022-03-27: qty 10, 31d supply, fill #0
  Filled 2022-05-14 – 2022-05-15 (×2): qty 10, 31d supply, fill #1
  Filled 2022-06-17: qty 10, 31d supply, fill #2

## 2022-03-27 MED ORDER — MIDODRINE HCL 10 MG PO TABS
ORAL_TABLET | ORAL | 5 refills | Status: DC
  Filled 2022-03-27: qty 60, fill #0
  Filled 2022-07-15: qty 30, 35d supply, fill #0

## 2022-03-27 NOTE — Progress Notes (Unsigned)
Patient is here for their 4 month follow-up Patient has no concerns today Care gaps have been discussed with patient

## 2022-03-28 ENCOUNTER — Encounter: Payer: Self-pay | Admitting: Family Medicine

## 2022-03-28 NOTE — Progress Notes (Signed)
Established Patient Office Visit  Subjective    Patient ID: Alvin Daniels, male    DOB: [birth year]  Age: 51 y.o. MRN: [id number]  CC:  Chief Complaint  Patient presents with   Follow-up    HPI Alvin Daniels presents for routine follow up of chronic med issues. Patient denies acute complaints.  This visit was aided by an interpreter.    Outpatient Encounter Medications as of 03/27/2022  Medication Sig   bictegravir-emtricitabine-tenofovir AF (BIKTARVY) 50-200-25 MG TABS tablet Take 1 tablet by mouth daily.   Blood Glucose Monitoring Suppl (TRUE METRIX METER) w/Device KIT Use kit to check glucose 3 (three) times daily.   glucose blood (TRUE METRIX BLOOD GLUCOSE TEST) test strip use 1 test strip to check glucose 3 (three) times daily.   Insulin Glargine (BASAGLAR KWIKPEN) 100 UNIT/ML Inject 16 Units into the skin daily. (Patient taking differently: Inject 6 Units into the skin daily.)   Insulin Syringe-Needle U-100 (BD INSULIN SYRINGE ULTRAFINE) 31G X 15/64" 0.5 ML MISC 1 each by Does not apply route 2 (two) times daily.   midodrine (PROAMATINE) 10 MG tablet Take 1 tablet (10 mg total) by mouth 3 (three) times daily with meals. In Spanish   midodrine (PROAMATINE) 10 MG tablet Take 1 tablet by mouth three times a week as directed Take 1 tablet 30 minutes prior to HD and 1 tab during treatment.   midodrine (PROAMATINE) 10 MG tablet Take 1 tablet by mouth three times a week as directed, then take 1 tablet 30 minutes prior to HD and 1 tab during treatment   Pitavastatin Magnesium 4 MG TABS Take 4 mg by mouth daily.   TRUEplus Lancets 28G MISC Use to test 3 (three) times daily.   [DISCONTINUED] insulin NPH-regular Human (HUMULIN 70/30) (70-30) 100 UNIT/ML injection Inject 15 unit subcutaneously twice a day as directed before meals   [DISCONTINUED] midodrine (PROAMATINE) 10 MG tablet Take 1 tablet by mouth three times a week as directed Take 1 tablet 30 minutes prior to HD and  1 tab during treatment   FOSRENOL 500 MG chewable tablet Chew 1,000 mg by mouth 3 (three) times daily. (Patient not taking: Reported on 01/02/2022)   insulin NPH-regular Human (HUMULIN 70/30) (70-30) 100 UNIT/ML injection Inject 15 Units into the skin 2 (two) times daily before a meal.   iron sucrose in sodium chloride 0.9 % 100 mL Iron Sucrose (Venofer) (Patient not taking: Reported on 01/02/2022)   lanthanum (FOSRENOL) 500 MG chewable tablet Chew by mouth. (Patient not taking: Reported on 01/02/2022)   LOPERAMIDE HCL PO Take by mouth. (Patient not taking: Reported on 01/02/2022)   Methoxy PEG-Epoetin Beta (MIRCERA IJ) Mircera (Patient not taking: Reported on 01/02/2022)   midodrine (PROAMATINE) 10 MG tablet Take 1 tablet by mouth three times a week as directed Take 1 tablet 30 minutes prior to HD and 1 tab during treatment   No facility-administered encounter medications on file as of 03/27/2022.    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[place])    Right   Diabetic neuropathy ([place])    ESRD (end stage renal disease) on dialysis ([place])    "TTS; don't remember street name" (05/03/2014)   Hepatitis C    HIV INFECTION  06/27/2010   Qualifier: Diagnosis of  By: Nickola Major CMA ( AAMA), Jacqueline     Hypotension 4/85/4627   Metabolic bone disease 0/07/5007   MRSA infection    Normocytic anemia 06/17/2012   Pancreatitis    Pressure ulcer of BKA stump, stage 2 ([place]) 11/22/2014   Renal disorder    Renal insufficiency    Severe protein-calorie malnutrition ([place]) 06/17/2012   Uncontrolled diabetes mellitus with complications 3/81/8299   Annotation: uncontrolled Qualifier: Diagnosis of  By: Nickola Major CMA Deborra Medina), Geni Bers      Past Surgical History:  Procedure  Laterality Date   AMPUTATION Left 04/20/2014   Procedure: 3rd toe amputation, 4th Toe Amputation,  5th Toe Amputation;  Surgeon: Newt Minion, MD;  Location: [place];  Service: Orthopedics;  Laterality: Left;   AMPUTATION Left 05/02/2014   Procedure: Midfoot Amputation;  Surgeon: Newt Minion, MD;  Location: [place];  Service: Orthopedics;  Laterality: Left;   AMPUTATION Left 06/17/2014   Procedure: AMPUTATION BELOW KNEE;  Surgeon: Newt Minion, MD;  Location: [place];  Service: Orthopedics;  Laterality: Left;   AMPUTATION Right 09/04/2018   Procedure: RIGHT BELOW KNEE AMPUTATION;  Surgeon: Newt Minion, MD;  Location: [place];  Service: Orthopedics;  Laterality: Right;  RIGHT BELOW KNEE AMPUTATION   AV FISTULA PLACEMENT Left    AV FISTULA PLACEMENT Left 05/10/2016   Procedure: Creation Left Arm Brachiocephalic Arteriovenous Fistula and Ligation of Radiocephalic Fistula;  Surgeon: Angelia Mould, MD;  Location: [place];  Service: Vascular;  Laterality: Left;   FEMUR IM NAIL Left 08/17/2016   Procedure: INTRAMEDULLARY (IM) RETROGRADE FEMORAL NAILING;  Surgeon: Marybelle Killings, MD;  Location: [place];  Service: Orthopedics;  Laterality: Left;   FOOT AMPUTATION THROUGH ANKLE Left 12/'21/2015   midfoot   IR GENERIC HISTORICAL Left 05/01/2016   IR THROMBECTOMY AV FISTULA W/THROMBOLYSIS/PTA INC/SHUNT/IMG LEFT 05/01/2016 [name], MD [facility]   IR GENERIC HISTORICAL  05/01/2016   IR US GUIDE VASC ACCESS LEFT 05/01/2016 [name], MD [facility]   IR GENERIC HISTORICAL  05/07/2016   IR FLUORO GUIDE CV LINE RIGHT 05/07/2016 Corrie Mckusick, DO [facility]   IR GENERIC HISTORICAL  05/07/2016   IR US GUIDE VASC ACCESS RIGHT 05/07/2016 Corrie Mckusick, DO [facility]   IR GENERIC HISTORICAL  05/22/2016   IR US GUIDE VASC ACCESS RIGHT 05/22/2016 Greggory Keen, MD [facility]   IR GENERIC HISTORICAL  05/22/2016   IR FLUORO GUIDE CV LINE RIGHT 05/22/2016 Greggory Keen, MD [facility]   IR REMOVAL  TUN CV CATH W/O [facility]  08/21/2016   PERIPHERAL VASCULAR CATHETERIZATION Left 05/09/2016   Procedure: A/V Fistulagram;  Surgeon: Angelia Mould, MD;  Location: [place] [facility];  Service: Cardiovascular;  Laterality: Left;  arm   TEE WITHOUT CARDIOVERSION N/A 06/22/2018   Procedure: TRANSESOPHAGEAL ECHOCARDIOGRAM (TEE);  Surgeon: Jerline Pain, MD;  Location[facility];  Service: Cardiovascular;  Laterality: N/A;    Family History  Problem Relation Age of Onset   Diabetes Mother    Diabetes Father     Social History   Socioeconomic History   Marital status: Single    Spouse name: Not on file   Number of children: Not on file   Years of education: Not on file   Highest education level: Not on file  Occupational History   Not on file  Tobacco Use   Smoking status: Never   Smokeless tobacco: Never  Vaping Use  Vaping Use: Never used  Substance and Sexual Activity   Alcohol use: No   Drug use: No   Sexual activity: Not Currently    Comment: declined condoms  Other Topics Concern   Not on file  Social History Narrative   ** Merged History Encounter **       Social Determinants of Health   Financial Resource Strain: Not on file  Food Insecurity: Not on file  Transportation Needs: Not on file  Physical Activity: Not on file  Stress: Not on file  Social Connections: Not on file  Intimate Partner Violence: Not on file    Review of Systems  All other systems reviewed and are negative.       Objective    BP 136/78   Pulse 92   Temp 98.1 F (36.7 C) (Oral)   Resp 16   Wt 113 lb (51.3 kg)   SpO2 95%   BMI 20.02 kg/m   Physical Exam Vitals and nursing note reviewed.  Constitutional:      General: He is not in acute distress. Cardiovascular:     Rate and Rhythm: Normal rate and regular rhythm.  Pulmonary:     Effort: Pulmonary effort is normal.     Breath sounds: Normal breath sounds.  Abdominal:     Palpations: Abdomen is soft.     Tenderness:  There is no abdominal tenderness.  Musculoskeletal:     Right Lower Extremity: Right leg is amputated below knee.     Left Lower Extremity: Left leg is amputated below knee.  Neurological:     General: No focal deficit present.     Mental Status: He is alert and oriented to person, place, and time.         Assessment & Plan:   1. Type 2 diabetes mellitus with other specified complication, with long-term current use of insulin ([place]) Patient reports some difficulty getting insulin and been on reduced dosing. A1c is above goal.  - POCT glycosylated hemoglobin (Hb A1C)  2. HIV infection, unspecified symptom status ([place]) Management as per consultant  3. Other specified hypotension Meds referred  4. S/P bilateral BKA (below knee amputation) ([place])   5. Language barrier to communication    Return in about 3 months (around 06/27/2022) for follow up.   Becky Sax, MD

## 2022-04-02 ENCOUNTER — Other Ambulatory Visit: Payer: Self-pay

## 2022-05-14 ENCOUNTER — Other Ambulatory Visit: Payer: Self-pay

## 2022-05-15 ENCOUNTER — Other Ambulatory Visit: Payer: Self-pay

## 2022-05-29 ENCOUNTER — Encounter: Payer: Self-pay | Admitting: Infectious Disease

## 2022-05-29 ENCOUNTER — Ambulatory Visit (INDEPENDENT_AMBULATORY_CARE_PROVIDER_SITE_OTHER): Payer: Self-pay | Admitting: Infectious Disease

## 2022-05-29 ENCOUNTER — Ambulatory Visit: Payer: Self-pay

## 2022-05-29 ENCOUNTER — Other Ambulatory Visit: Payer: Self-pay

## 2022-05-29 VITALS — BP 111/63 | HR 92 | Temp 98.3°F | Ht 62.99 in | Wt 110.0 lb

## 2022-05-29 DIAGNOSIS — I9589 Other hypotension: Secondary | ICD-10-CM

## 2022-05-29 DIAGNOSIS — B20 Human immunodeficiency virus [HIV] disease: Secondary | ICD-10-CM

## 2022-05-29 DIAGNOSIS — E1122 Type 2 diabetes mellitus with diabetic chronic kidney disease: Secondary | ICD-10-CM

## 2022-05-29 DIAGNOSIS — Z7185 Encounter for immunization safety counseling: Secondary | ICD-10-CM

## 2022-05-29 DIAGNOSIS — N186 End stage renal disease: Secondary | ICD-10-CM

## 2022-05-29 HISTORY — DX: Encounter for immunization safety counseling: Z71.85

## 2022-05-29 MED ORDER — BIKTARVY 50-200-25 MG PO TABS: 1.0000 | ORAL_TABLET | Freq: Every day | ORAL | 11 refills | Status: DC

## 2022-05-29 MED ORDER — PITAVASTATIN MAGNESIUM 4 MG PO TABS: 4.0000 mg | ORAL_TABLET | Freq: Every day | ORAL | 11 refills | Status: DC

## 2022-05-29 NOTE — Progress Notes (Signed)
Subjective:  Chief complaint: Follow-up for HIV disease on medications  Patient ID: Alvin Daniels, male    DOB: 1970-09-23, 52 y.o.   MRN: [id number]  HPI   Note care provided with in person Spanish translator  52 year old Hispanic man living with HIV that has been perfectly controlled on Biktarvy. He has had bilateral BKA'S with problems tin July 2021 with MRSA bacteremia due to stump infection that was debrided. He could not get TEE due to low blood pressure.   He was continuing to have trouble with chronic hypotension having difficulty obtaining midodrine due to price.  I last saw him he was on midodrine 10 mg 3 times daily but only taking it on dialysis days due to a miscommunication about how he could take the pills and how many pills he had.  He continuedto have some ulcers bilaterally at his BKA sites due to poor fitting prosthesis and inability to afford new prosthesis.  He was applying a topical antibiotic that he purchased in [place].  Since the last time he seen Dr. Sharol Given in follow-up who provided him with new socket liner materials and supplies. Thankfully he even has new prostheses bilaterally his wounds have now healed up which is greatly reassuring.  He establish care with [facility] who are prescribing him most of his medications besides the BIKTARVY and the midodrine.  However says he has found another doctor on [facility] [address] who speaks Spanish and who is helping him out better he is complaining about the cost of multiple medications though many of them would be covered by the HIV medication assistance program (zero copays) and/or [place]  In those lines I also prescribed midodrine and the dispensing seems to indicate that he is receiving it but he claims he is only receiving 6 beats per week and so he is only taking it with dialysis.  [address] that is part of [place] and he has been able to get insulin andIs now seeing a primary care  physician his other oral med patients for $15 a month.  His BIKTARVY and statin are being shipped from [facility] out of [place].  Says he would like to file a complaint against the dialysis center where he goes to be says he gets up at 4:30 in the morning to take a bus service that gets him to the dialysis center at 67 and yet he is not seen until after many other people are seen together after he does.     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   Diabetic neuropathy ([place])    ESRD (end stage renal disease) on dialysis ([place])    "TTS; don't remember street name" (05/03/2014)   Hepatitis C    HIV INFECTION 06/27/2010   Qualifier: Diagnosis of  By: Nickola Major CMA ( AAMA), Jacqueline     Hypotension 4/31/5400   Metabolic bone disease 12/16/7617   MRSA infection    Normocytic anemia 06/17/2012   Pancreatitis    Pressure ulcer of BKA stump, stage 2 ([place]) 11/22/2014   Renal disorder    Renal insufficiency    Severe protein-calorie malnutrition ([place]) 06/17/2012   Uncontrolled diabetes mellitus with complications 09/18/3265  Annotation: uncontrolled Qualifier: Diagnosis of  By: Nickola Major CMA Deborra Medina), Geni Bers      Past Surgical History:  Procedure Laterality Date   AMPUTATION Left 04/20/2014   Procedure: 3rd toe amputation, 4th Toe Amputation,  5th Toe Amputation;  Surgeon: Newt Minion, MD;  Location: [facility];  Service: Orthopedics;  Laterality: Left;   AMPUTATION Left 05/02/2014   Procedure: Midfoot Amputation;  Surgeon: Newt Minion, MD;  Location: [place];  Service: Orthopedics;  Laterality: Left;   AMPUTATION Left 06/17/2014   Procedure: AMPUTATION BELOW KNEE;  Surgeon: Newt Minion, MD;  Location: [place];  Service: Orthopedics;   Laterality: Left;   AMPUTATION Right 09/04/2018   Procedure: RIGHT BELOW KNEE AMPUTATION;  Surgeon: Newt Minion, MD;  Location: [facility];  Service: Orthopedics;  Laterality: Right;  RIGHT BELOW KNEE AMPUTATION   AV FISTULA PLACEMENT Left    AV FISTULA PLACEMENT Left 05/10/2016   Procedure: Creation Left Arm Brachiocephalic Arteriovenous Fistula and Ligation of Radiocephalic Fistula;  Surgeon: Angelia Mould, MD;  Location: [place];  Service: Vascular;  Laterality: Left;   FEMUR IM NAIL Left 08/17/2016   Procedure: INTRAMEDULLARY (IM) RETROGRADE FEMORAL NAILING;  Surgeon: Marybelle Killings, MD;  Location: [place];  Service: Orthopedics;  Laterality: Left;   FOOT AMPUTATION THROUGH ANKLE Left 12/'21/2015   midfoot   IR GENERIC HISTORICAL Left 05/01/2016   IR THROMBECTOMY AV FISTULA W/THROMBOLYSIS/PTA INC/SHUNT/IMG LEFT 05/01/2016 [name], MD [facility]   IR GENERIC HISTORICAL  05/01/2016   IR US GUIDE VASC ACCESS LEFT 05/01/2016 [name], MD [facility]   IR GENERIC HISTORICAL  05/07/2016   IR FLUORO GUIDE CV LINE RIGHT 05/07/2016 Corrie Mckusick, DO [facility]   IR GENERIC HISTORICAL  05/07/2016   IR US GUIDE VASC ACCESS RIGHT 05/07/2016 Corrie Mckusick, DO [facility]   IR GENERIC HISTORICAL  05/22/2016   IR US GUIDE VASC ACCESS RIGHT 05/22/2016 Greggory Keen, MD [facility]   IR GENERIC HISTORICAL  05/22/2016   IR FLUORO GUIDE CV LINE RIGHT 05/22/2016 Greggory Keen, MD [facility]   IR REMOVAL TUN CV CATH W/O [facility]  08/21/2016   PERIPHERAL VASCULAR CATHETERIZATION Left 05/09/2016   Procedure: A/V Fistulagram;  Surgeon: Angelia Mould, MD;  Location: [place] CV LAB;  Service: Cardiovascular;  Laterality: Left;  arm   TEE WITHOUT CARDIOVERSION N/A 06/22/2018   Procedure: TRANSESOPHAGEAL ECHOCARDIOGRAM (TEE);  Surgeon: Jerline Pain, MD;  Location[facility] ENDOSCOPY;  Service: Cardiovascular;  Laterality: N/A;    Family History  Problem Relation Age of Onset   Diabetes  Mother    Diabetes Father       Social History   Socioeconomic History   Marital status: Single    Spouse name: Not on file   Number of children: Not on file   Years of education: Not on file   Highest education level: Not on file  Occupational History   Not on file  Tobacco Use   Smoking status: Never   Smokeless tobacco: Never  Vaping Use   Vaping Use: Never used  Substance and Sexual Activity   Alcohol use: No   Drug use: No   Sexual activity: Not Currently    Comment: declined condoms  Other Topics Concern   Not on file  Social History Narrative   ** Merged History Encounter **       Social Determinants of Health   Financial Resource Strain: Not on file  Food Insecurity: Not on  file  Transportation Needs: Not on file  Physical Activity: Not on file  Stress: Not on file  Social Connections: Not on file    No Known Allergies   Current Outpatient Medications:    bictegravir-emtricitabine-tenofovir AF (BIKTARVY) 50-200-25 MG TABS tablet, Take 1 tablet by mouth daily., Disp: 30 tablet, Rfl: 11   Blood Glucose Monitoring Suppl (TRUE METRIX METER) w/Device KIT, Use kit to check glucose 3 (three) times daily., Disp: 1 kit, Rfl: 0   FOSRENOL 500 MG chewable tablet, Chew 1,000 mg by mouth 3 (three) times daily. (Patient not taking: Reported on 01/02/2022), Disp: , Rfl:    glucose blood (TRUE METRIX BLOOD GLUCOSE TEST) test strip, use 1 test strip to check glucose 3 (three) times daily., Disp: 100 each, Rfl: 2   Insulin Glargine (BASAGLAR KWIKPEN) 100 UNIT/ML, Inject 16 Units into the skin daily. (Patient taking differently: Inject 6 Units into the skin daily.), Disp: 6 mL, Rfl: 2   insulin NPH-regular Human (HUMULIN 70/30) (70-30) 100 UNIT/ML injection, Inject 15 Units into the skin 2 (two) times daily before a meal., Disp: 10 mL, Rfl: 5   Insulin Syringe-Needle U-100 (BD INSULIN SYRINGE ULTRAFINE) 31G X 15/64" 0.5 ML MISC, 1 each by Does not apply route 2 (two) times  daily., Disp: 100 each, Rfl: 5   iron sucrose in sodium chloride 0.9 % 100 mL, Iron Sucrose (Venofer) (Patient not taking: Reported on 01/02/2022), Disp: , Rfl:    lanthanum (FOSRENOL) 500 MG chewable tablet, Chew by mouth. (Patient not taking: Reported on 01/02/2022), Disp: , Rfl:    LOPERAMIDE HCL PO, Take by mouth. (Patient not taking: Reported on 01/02/2022), Disp: , Rfl:    Methoxy PEG-Epoetin Beta (MIRCERA IJ), Mircera (Patient not taking: Reported on 01/02/2022), Disp: , Rfl:    midodrine (PROAMATINE) 10 MG tablet, Take 1 tablet (10 mg total) by mouth 3 (three) times daily with meals. In Spanish, Disp: 90 tablet, Rfl: 11   midodrine (PROAMATINE) 10 MG tablet, Take 1 tablet by mouth three times a week as directed Take 1 tablet 30 minutes prior to HD and 1 tab during treatment., Disp: 60 tablet, Rfl: 3   midodrine (PROAMATINE) 10 MG tablet, Take 1 tablet by mouth three times a week as directed, then take 1 tablet 30 minutes prior to HD and 1 tab during treatment, Disp: 60 tablet, Rfl: 3   midodrine (PROAMATINE) 10 MG tablet, Take 1 tablet by mouth three times a week as directed Take 1 tablet 30 minutes prior to HD and 1 tab during treatment, Disp: 60 tablet, Rfl: 5   Pitavastatin Magnesium 4 MG TABS, Take 4 mg by mouth daily., Disp: 30 tablet, Rfl: 11   TRUEplus Lancets 28G MISC, Use to test 3 (three) times daily., Disp: 100 each, Rfl: 2\  Review of Systems  Constitutional:  Negative for activity change, appetite change, chills, diaphoresis, fatigue, fever and unexpected weight change.  HENT:  Negative for congestion, rhinorrhea, sinus pressure, sneezing, sore throat and trouble swallowing.   Eyes:  Negative for photophobia and visual disturbance.  Respiratory:  Negative for cough, chest tightness, shortness of breath, wheezing and stridor.   Cardiovascular:  Negative for chest pain, palpitations and leg swelling.  Gastrointestinal:  Negative for abdominal distention, abdominal pain, anal  bleeding, blood in stool, constipation, diarrhea, nausea and vomiting.  Genitourinary:  Negative for difficulty urinating, dysuria, flank pain and hematuria.  Musculoskeletal:  Negative for arthralgias, back pain, gait problem, joint swelling and myalgias.  Skin:  Negative for color change, pallor, rash and wound.  Neurological:  Negative for dizziness, tremors, weakness and light-headedness.  Hematological:  Negative for adenopathy. Does not bruise/bleed easily.  Psychiatric/Behavioral:  Negative for agitation, behavioral problems, confusion, decreased concentration, dysphoric mood and sleep disturbance.        Objective:   Physical Exam Constitutional:      Appearance: He is well-developed.  HENT:     Head: Normocephalic and atraumatic.  Eyes:     Conjunctiva/sclera: Conjunctivae normal.  Cardiovascular:     Rate and Rhythm: Normal rate and regular rhythm.  Pulmonary:     Effort: Pulmonary effort is normal. No respiratory distress.     Breath sounds: No wheezing.  Abdominal:     General: There is no distension.     Palpations: Abdomen is soft.  Musculoskeletal:        General: No tenderness. Normal range of motion.     Cervical back: Normal range of motion and neck supple.  Skin:    General: Skin is warm and dry.     Coloration: Skin is not pale.     Findings: No erythema or rash.  Neurological:     General: No focal deficit present.     Mental Status: He is alert and oriented to person, place, and time.  Psychiatric:        Mood and Affect: Mood normal.        Behavior: Behavior normal.        Thought Content: Thought content normal.        Judgment: Judgment normal.        Assessment & Plan:   HIV disease:  I will add order HIV viral load CD4 count CBC with differential CMP, RPR GC and chlamydia and I will continue  Jader Pendergrass's Biktarvy prescription  Bilateral skin ulcerations at BKA sites  resolved  Hypotension stage renal disease on hemodialysis  going to dialysis but has complaints about working hard to get there very early and not being seen for a while sure if this is a scheduling issue or communication problem or if he really is being ignored when he gets there  Diabetes mellitus:  Hyperlipidemia: recheck lipid panel and he may need a more potent statin such as crestor  Lipid Panel     Component Value Date/Time   CHOL 167 01/02/2022 0301   TRIG 259 (H) 01/02/2022 0301   HDL 37 (L) 01/02/2022 0301   CHOLHDL 4.5 01/02/2022 0301   VLDL 13 06/13/2014 1406   [place] 93 01/02/2022 0301     Vaccine counseling: Updated COVID-19 vaccine is recommended but he refused  IDDM: on insulin thru PCP

## 2022-05-30 LAB — T-HELPER CELLS (CD4) COUNT (NOT AT ARMC)
CD4 % Helper T Cell: 26 % — ABNORMAL LOW (ref 33–65)
CD4 T Cell Abs: 322 /uL — ABNORMAL LOW (ref 400–1790)

## 2022-06-01 LAB — RPR: RPR Ser Ql: NONREACTIVE

## 2022-06-01 LAB — CBC WITH DIFFERENTIAL/PLATELET
Absolute Monocytes: 456 cells/uL (ref 200–950)
Basophils Absolute: 91 cells/uL (ref 0–200)
Basophils Relative: 1.2 %
Eosinophils Absolute: 403 cells/uL (ref 15–500)
Eosinophils Relative: 5.3 %
HCT: 31.6 % — ABNORMAL LOW (ref 38.5–50.0)
Hemoglobin: 10.6 g/dL — ABNORMAL LOW (ref 13.2–17.1)
Lymphs Abs: 1406 cells/uL (ref 850–3900)
MCH: 32.7 pg (ref 27.0–33.0)
MCHC: 33.5 g/dL (ref 32.0–36.0)
MCV: 97.5 fL (ref 80.0–100.0)
MPV: 10 fL (ref 7.5–12.5)
Monocytes Relative: 6 %
Neutro Abs: 5244 cells/uL (ref 1500–7800)
Neutrophils Relative %: 69 %
Platelets: 257 10*3/uL (ref 140–400)
RBC: 3.24 10*6/uL — ABNORMAL LOW (ref 4.20–5.80)
RDW: 12.8 % (ref 11.0–15.0)
Total Lymphocyte: 18.5 %
WBC: 7.6 10*3/uL (ref 3.8–10.8)

## 2022-06-01 LAB — HIV-1 RNA QUANT-NO REFLEX-BLD
HIV 1 RNA Quant: NOT DETECTED Copies/mL
HIV-1 RNA Quant, Log: NOT DETECTED Log cps/mL

## 2022-06-01 LAB — LIPID PANEL
Cholesterol: 121 mg/dL (ref <200)
HDL: 39 mg/dL — ABNORMAL LOW (ref >=40)
LDL Cholesterol (Calc): 56 mg/dL (calc)
Non-HDL Cholesterol (Calc): 82 mg/dL (calc) (ref <130)
Total CHOL/HDL Ratio: 3.1 (calc) (ref <5.0)
Triglycerides: 182 mg/dL — ABNORMAL HIGH (ref <150)

## 2022-06-17 ENCOUNTER — Other Ambulatory Visit: Payer: Self-pay

## 2022-06-18 ENCOUNTER — Other Ambulatory Visit: Payer: Self-pay

## 2022-06-26 ENCOUNTER — Encounter: Payer: Self-pay | Admitting: Family Medicine

## 2022-06-26 ENCOUNTER — Other Ambulatory Visit: Payer: Self-pay

## 2022-06-26 ENCOUNTER — Ambulatory Visit (INDEPENDENT_AMBULATORY_CARE_PROVIDER_SITE_OTHER): Payer: Self-pay | Admitting: Family Medicine

## 2022-06-26 VITALS — BP 172/92 | HR 88 | Temp 98.1°F | Resp 16 | Wt 110.0 lb

## 2022-06-26 DIAGNOSIS — Z789 Other specified health status: Secondary | ICD-10-CM

## 2022-06-26 DIAGNOSIS — Z21 Asymptomatic human immunodeficiency virus [HIV] infection status: Secondary | ICD-10-CM

## 2022-06-26 DIAGNOSIS — Z794 Long term (current) use of insulin: Secondary | ICD-10-CM

## 2022-06-26 DIAGNOSIS — E1169 Type 2 diabetes mellitus with other specified complication: Secondary | ICD-10-CM

## 2022-06-26 DIAGNOSIS — B20 Human immunodeficiency virus [HIV] disease: Secondary | ICD-10-CM

## 2022-06-26 LAB — POCT GLYCOSYLATED HEMOGLOBIN (HGB A1C): Hemoglobin A1C: 10 % — AB (ref 4.0–5.6)

## 2022-06-26 MED ORDER — HUMULIN 70/30 (70-30) 100 UNIT/ML ~~LOC~~ SUSP
SUBCUTANEOUS | 5 refills | Status: DC
  Filled 2022-06-26: qty 20, fill #0
  Filled 2022-07-15: qty 20, 50d supply, fill #0
  Filled 2022-09-16 (×2): qty 20, 50d supply, fill #1

## 2022-06-26 NOTE — Progress Notes (Unsigned)
Patient is requesting that he get back 70/30 insulin. Patient said that 70/30 help with his eye sight and he feels  much better overall .

## 2022-06-27 ENCOUNTER — Encounter: Payer: Self-pay | Admitting: Family Medicine

## 2022-06-27 NOTE — Progress Notes (Signed)
Established Patient Office Visit  Subjective    Patient ID: Alvin Daniels, male    DOB: 1970/10/22  Age: 52 y.o. MRN: [id number]  CC:  Chief Complaint  Patient presents with   Follow-up   Diabetes    HPI Alvin Daniels presents for follow up of diabetes. He reports that he desires to be back on the 70/30 as he generally feels better on that and believes he is also better controlled. This visit was aided by an interpreter.    Outpatient Encounter Medications as of 06/26/2022  Medication Sig   bictegravir-emtricitabine-tenofovir AF (BIKTARVY) 50-200-25 MG TABS tablet Take 1 tablet by mouth daily.   Blood Glucose Monitoring Suppl (TRUE METRIX METER) w/Device KIT Use kit to check glucose 3 (three) times daily.   FOSRENOL 500 MG chewable tablet Chew 1,000 mg by mouth 3 (three) times daily.   glucose blood (TRUE METRIX BLOOD GLUCOSE TEST) test strip use 1 test strip to check glucose 3 (three) times daily.   Insulin Syringe-Needle U-100 (BD INSULIN SYRINGE ULTRAFINE) 31G X 15/64" 0.5 ML MISC 1 each by Does not apply route 2 (two) times daily.   midodrine (PROAMATINE) 10 MG tablet Take 1 tablet (10 mg total) by mouth 3 (three) times daily with meals. In Spanish   midodrine (PROAMATINE) 10 MG tablet Take 1 tablet by mouth three times a week as directed Take 1 tablet 30 minutes prior to HD and 1 tab during treatment.   midodrine (PROAMATINE) 10 MG tablet Take 1 tablet by mouth three times a week as directed, then take 1 tablet 30 minutes prior to HD and 1 tab during treatment   midodrine (PROAMATINE) 10 MG tablet Take 1 tablet by mouth three times a week as directed Take 1 tablet 30 minutes prior to HD and 1 tab during treatment   Pitavastatin Magnesium 4 MG TABS Take 4 mg by mouth daily.   TRUEplus Lancets 28G MISC Use to test 3 (three) times daily.   [DISCONTINUED] insulin NPH-regular Human (HUMULIN 70/30) (70-30) 100 UNIT/ML injection Inject 15 Units into the skin 2 (two)  times daily before a meal.   Insulin Glargine (BASAGLAR KWIKPEN) 100 UNIT/ML Inject 16 Units into the skin daily. (Patient not taking: Reported on 05/29/2022)   insulin NPH-regular Human (HUMULIN 70/30) (70-30) 100 UNIT/ML injection Inject 20 units subcutaneously twice daily with meals   iron sucrose in sodium chloride 0.9 % 100 mL Iron Sucrose (Venofer) (Patient not taking: Reported on 01/02/2022)   lanthanum (FOSRENOL) 500 MG chewable tablet Chew by mouth. (Patient not taking: Reported on 01/02/2022)   LOPERAMIDE HCL PO Take by mouth. (Patient not taking: Reported on 01/02/2022)   Methoxy PEG-Epoetin Beta (MIRCERA IJ) Mircera (Patient not taking: Reported on 01/02/2022)   [DISCONTINUED] bictegravir-emtricitabine-tenofovir AF (BIKTARVY) 50-200-25 MG TABS tablet Take 1 tablet by mouth daily.   No facility-administered encounter medications on file as of 06/26/2022.    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   Diabetic neuropathy ([place])    ESRD (end stage renal disease) on dialysis ([place])    "TTS; don't remember street name" (05/03/2014)   Hepatitis C    HIV INFECTION 06/27/2010   Qualifier: Diagnosis of  By: Nickola Major  CMA ( AAMA), Jacqueline     Hypotension 0000000   Metabolic bone disease [id number]   MRSA infection    Normocytic anemia 06/17/2012   Pancreatitis    Pressure ulcer of BKA stump, stage 2 ([place]) 11/22/2014   Renal disorder    Renal insufficiency    Severe protein-calorie malnutrition ([place]) 06/17/2012   Uncontrolled diabetes mellitus with complications [id number]   Annotation: uncontrolled Qualifier: Diagnosis of  By: Nickola Major CMA Deborra Medina), Jacqueline     Vaccine counseling 05/29/2022    Past Surgical History:   Procedure Laterality Date   AMPUTATION Left 04/20/2014   Procedure: 3rd toe amputation, 4th Toe Amputation,  5th Toe Amputation;  Surgeon: Newt Minion, MD;  Location: [place];  Service: Orthopedics;  Laterality: Left;   AMPUTATION Left 05/02/2014   Procedure: Midfoot Amputation;  Surgeon: Newt Minion, MD;  Location: [place];  Service: Orthopedics;  Laterality: Left;   AMPUTATION Left 06/17/2014   Procedure: AMPUTATION BELOW KNEE;  Surgeon: Newt Minion, MD;  Location: [place];  Service: Orthopedics;  Laterality: Left;   AMPUTATION Right 09/04/2018   Procedure: RIGHT BELOW KNEE AMPUTATION;  Surgeon: Newt Minion, MD;  Location: [place];  Service: Orthopedics;  Laterality: Right;  RIGHT BELOW KNEE AMPUTATION   AV FISTULA PLACEMENT Left    AV FISTULA PLACEMENT Left 05/10/2016   Procedure: Creation Left Arm Brachiocephalic Arteriovenous Fistula and Ligation of Radiocephalic Fistula;  Surgeon: Angelia Mould, MD;  Location: [place];  Service: Vascular;  Laterality: Left;   FEMUR IM NAIL Left 08/17/2016   Procedure: INTRAMEDULLARY (IM) RETROGRADE FEMORAL NAILING;  Surgeon: Marybelle Killings, MD;  Location: [place];  Service: Orthopedics;  Laterality: Left;   FOOT AMPUTATION THROUGH ANKLE Left 12/'21/2015   midfoot   IR GENERIC HISTORICAL Left 05/01/2016   IR THROMBECTOMY AV FISTULA W/THROMBOLYSIS/PTA INC/SHUNT/IMG LEFT 05/01/2016 [name], MD [facility]   IR GENERIC HISTORICAL  05/01/2016   IR US GUIDE VASC ACCESS LEFT 05/01/2016 [name], MD [facility]   IR GENERIC HISTORICAL  05/07/2016   IR FLUORO GUIDE CV LINE RIGHT 05/07/2016 Corrie Mckusick, DO [facility]   IR GENERIC HISTORICAL  05/07/2016   IR US GUIDE VASC ACCESS RIGHT 05/07/2016 Corrie Mckusick, DO [facility]   IR GENERIC HISTORICAL  05/22/2016   IR US GUIDE VASC ACCESS RIGHT 05/22/2016 Greggory Keen, MD [facility]   IR GENERIC HISTORICAL  05/22/2016   IR FLUORO GUIDE CV LINE RIGHT 05/22/2016 Greggory Keen, MD [facility]    IR REMOVAL TUN CV CATH W/O [facility]  08/21/2016   PERIPHERAL VASCULAR CATHETERIZATION Left 05/09/2016   Procedure: A/V Fistulagram;  Surgeon: Angelia Mould, MD;  Location: [place] CV LAB;  Service: Cardiovascular;  Laterality: Left;  arm   TEE WITHOUT CARDIOVERSION N/A 06/22/2018   Procedure: TRANSESOPHAGEAL ECHOCARDIOGRAM (TEE);  Surgeon: Jerline Pain, MD;  Location: [facility] ENDOSCOPY;  Service: Cardiovascular;  Laterality: N/A;    Family History  Problem Relation Age of Onset   Diabetes Mother    Diabetes Father     Social History   Socioeconomic History   Marital status: Single    Spouse name: Not on file   Number of children: Not on file   Years of education: Not on file   Highest education level: Not on file  Occupational History   Not on file  Tobacco Use   Smoking status: Never   Smokeless tobacco: Never  Vaping Use   Vaping Use: Never  used  Substance and Sexual Activity   Alcohol use: No   Drug use: No   Sexual activity: Not Currently    Comment: declined condoms  Other Topics Concern   Not on file  Social History Narrative   ** Merged History Encounter **       Social Determinants of Health   Financial Resource Strain: Not on file  Food Insecurity: Not on file  Transportation Needs: Not on file  Physical Activity: Not on file  Stress: Not on file  Social Connections: Not on file  Intimate Partner Violence: Not on file    Review of Systems  All other systems reviewed and are negative.       Objective    BP (!) 172/92   Pulse 88   Temp 98.1 F (36.7 C) (Oral)   Resp 16   Wt 110 lb (49.9 kg)   SpO2 98%   BMI 19.49 kg/m   Physical Exam Vitals and nursing note reviewed.  Constitutional:      General: He is not in acute distress. Cardiovascular:     Rate and Rhythm: Normal rate and regular rhythm.  Pulmonary:     Effort: Pulmonary effort is normal.     Breath sounds: Normal breath sounds.  Abdominal:     Palpations: Abdomen is  soft.     Tenderness: There is no abdominal tenderness.  Musculoskeletal:     Comments: Utilizing walker     Right Lower Extremity: Right leg is amputated below knee.     Left Lower Extremity: Left leg is amputated below knee.  Neurological:     General: No focal deficit present.     Mental Status: He is alert and oriented to person, place, and time.         Assessment & Plan:   1. Type 2 diabetes mellitus with other specified complication, with long-term current use of insulin ([name]) Elevated A1c. Will refill 70/30 insulin and increase to 20 units BID and monitor - POCT glycosylated hemoglobin (Hb A1C)  2. HIV infection, unspecified symptom status ([place]) Management as per consultant.  3. Language barrier to communication     Return in about 3 months (around 09/24/2022) for follow up.   Becky Sax, MD

## 2022-07-15 ENCOUNTER — Other Ambulatory Visit: Payer: Self-pay

## 2022-07-16 ENCOUNTER — Other Ambulatory Visit: Payer: Self-pay

## 2022-08-01 ENCOUNTER — Emergency Department (HOSPITAL_COMMUNITY): Payer: Self-pay

## 2022-08-01 ENCOUNTER — Encounter (HOSPITAL_COMMUNITY): Payer: Self-pay

## 2022-08-01 ENCOUNTER — Other Ambulatory Visit: Payer: Self-pay

## 2022-08-01 ENCOUNTER — Inpatient Hospital Stay (HOSPITAL_COMMUNITY)
Admission: EM | Admit: 2022-08-01 | Discharge: 2022-08-03 | DRG: 640 | Disposition: A | Discharge status: Home / Self Care | Payer: Self-pay | Source: Ambulatory Visit | Attending: Internal Medicine | Admitting: Internal Medicine

## 2022-08-01 DIAGNOSIS — R739 Hyperglycemia, unspecified: Secondary | ICD-10-CM

## 2022-08-01 DIAGNOSIS — Z79899 Other long term (current) drug therapy: Secondary | ICD-10-CM

## 2022-08-01 DIAGNOSIS — Z89511 Acquired absence of right leg below knee: Secondary | ICD-10-CM

## 2022-08-01 DIAGNOSIS — Z89512 Acquired absence of left leg below knee: Secondary | ICD-10-CM

## 2022-08-01 DIAGNOSIS — Z833 Family history of diabetes mellitus: Secondary | ICD-10-CM

## 2022-08-01 DIAGNOSIS — E113291 Type 2 diabetes mellitus with mild nonproliferative diabetic retinopathy without macular edema, right eye: Secondary | ICD-10-CM | POA: Diagnosis present

## 2022-08-01 DIAGNOSIS — Z603 Acculturation difficulty: Secondary | ICD-10-CM | POA: Diagnosis present

## 2022-08-01 DIAGNOSIS — H4052X0 Glaucoma secondary to other eye disorders, left eye, stage unspecified: Secondary | ICD-10-CM | POA: Diagnosis present

## 2022-08-01 DIAGNOSIS — B182 Chronic viral hepatitis C: Secondary | ICD-10-CM | POA: Diagnosis present

## 2022-08-01 DIAGNOSIS — N2581 Secondary hyperparathyroidism of renal origin: Secondary | ICD-10-CM | POA: Diagnosis present

## 2022-08-01 DIAGNOSIS — M898X9 Other specified disorders of bone, unspecified site: Secondary | ICD-10-CM | POA: Diagnosis present

## 2022-08-01 DIAGNOSIS — E1122 Type 2 diabetes mellitus with diabetic chronic kidney disease: Secondary | ICD-10-CM | POA: Diagnosis present

## 2022-08-01 DIAGNOSIS — E1165 Type 2 diabetes mellitus with hyperglycemia: Secondary | ICD-10-CM | POA: Diagnosis present

## 2022-08-01 DIAGNOSIS — H5712 Ocular pain, left eye: Principal | ICD-10-CM

## 2022-08-01 DIAGNOSIS — H5462 Unqualified visual loss, left eye, normal vision right eye: Secondary | ICD-10-CM | POA: Diagnosis present

## 2022-08-01 DIAGNOSIS — N186 End stage renal disease: Secondary | ICD-10-CM | POA: Diagnosis present

## 2022-08-01 DIAGNOSIS — D631 Anemia in chronic kidney disease: Secondary | ICD-10-CM | POA: Diagnosis present

## 2022-08-01 DIAGNOSIS — B20 Human immunodeficiency virus [HIV] disease: Secondary | ICD-10-CM | POA: Diagnosis present

## 2022-08-01 DIAGNOSIS — E875 Hyperkalemia: Principal | ICD-10-CM | POA: Diagnosis present

## 2022-08-01 DIAGNOSIS — I9589 Other hypotension: Secondary | ICD-10-CM | POA: Diagnosis present

## 2022-08-01 DIAGNOSIS — H4050X0 Glaucoma secondary to other eye disorders, unspecified eye, stage unspecified: Secondary | ICD-10-CM

## 2022-08-01 DIAGNOSIS — E1140 Type 2 diabetes mellitus with diabetic neuropathy, unspecified: Secondary | ICD-10-CM | POA: Diagnosis present

## 2022-08-01 DIAGNOSIS — H18422 Band keratopathy, left eye: Secondary | ICD-10-CM | POA: Diagnosis present

## 2022-08-01 DIAGNOSIS — Z794 Long term (current) use of insulin: Secondary | ICD-10-CM

## 2022-08-01 DIAGNOSIS — Z992 Dependence on renal dialysis: Secondary | ICD-10-CM

## 2022-08-01 LAB — SEDIMENTATION RATE: Sed Rate: 52 mm/hr — ABNORMAL HIGH (ref 0–16)

## 2022-08-01 LAB — BASIC METABOLIC PANEL
Anion gap: 13 (ref 5–15)
BUN: 35 mg/dL — ABNORMAL HIGH (ref 6–20)
CO2: 26 mmol/L (ref 22–32)
Calcium: 8.9 mg/dL (ref 8.9–10.3)
Chloride: 88 mmol/L — ABNORMAL LOW (ref 98–111)
Creatinine, Ser: 9.05 mg/dL — ABNORMAL HIGH (ref 0.61–1.24)
GFR, Estimated: 6 mL/min — ABNORMAL LOW (ref >60)
Glucose, Bld: 588 mg/dL (ref 70–99)
Potassium: 6.5 mmol/L (ref 3.5–5.1)
Sodium: 127 mmol/L — ABNORMAL LOW (ref 135–145)

## 2022-08-01 LAB — CBC WITH DIFFERENTIAL/PLATELET
Abs Immature Granulocytes: 0.02 10*3/uL (ref 0.00–0.07)
Basophils Absolute: 0.1 10*3/uL (ref 0.0–0.1)
Basophils Relative: 1 %
Eosinophils Absolute: 0.5 10*3/uL (ref 0.0–0.5)
Eosinophils Relative: 6 %
HCT: 36.9 % — ABNORMAL LOW (ref 39.0–52.0)
Hemoglobin: 12.6 g/dL — ABNORMAL LOW (ref 13.0–17.0)
Immature Granulocytes: 0 %
Lymphocytes Relative: 16 %
Lymphs Abs: 1.3 10*3/uL (ref 0.7–4.0)
MCH: 32.9 pg (ref 26.0–34.0)
MCHC: 34.1 g/dL (ref 30.0–36.0)
MCV: 96.3 fL (ref 80.0–100.0)
Monocytes Absolute: 0.5 10*3/uL (ref 0.1–1.0)
Monocytes Relative: 7 %
Neutro Abs: 5.5 10*3/uL (ref 1.7–7.7)
Neutrophils Relative %: 70 %
Platelets: 193 10*3/uL (ref 150–400)
RBC: 3.83 MIL/uL — ABNORMAL LOW (ref 4.22–5.81)
RDW: 12.5 % (ref 11.5–15.5)
WBC: 7.9 10*3/uL (ref 4.0–10.5)
nRBC: 0 % (ref 0.0–0.2)

## 2022-08-01 LAB — CBG MONITORING, ED: Glucose-Capillary: 512 mg/dL (ref 70–99)

## 2022-08-01 MED ORDER — INSULIN ASPART 100 UNIT/ML IV SOLN
5.0000 [IU] | Freq: Once | INTRAVENOUS | Status: AC
  Administered 2022-08-01: 5 [IU] via INTRAVENOUS

## 2022-08-01 MED ORDER — SODIUM ZIRCONIUM CYCLOSILICATE 10 G PO PACK
10.0000 g | PACK | Freq: Once | ORAL | Status: AC
  Administered 2022-08-01: 10 g via ORAL
  Filled 2022-08-01: qty 1

## 2022-08-01 MED ORDER — INSULIN ASPART 100 UNIT/ML IJ SOLN
0.0000 [IU] | Freq: Every day | INTRAMUSCULAR | Status: DC
  Administered 2022-08-01: 5 [IU] via SUBCUTANEOUS

## 2022-08-01 MED ORDER — LANTHANUM CARBONATE 500 MG PO CHEW
1000.0000 mg | CHEWABLE_TABLET | Freq: Three times a day (TID) | ORAL | Status: DC
  Administered 2022-08-02 – 2022-08-03 (×4): 1000 mg via ORAL
  Filled 2022-08-01 (×5): qty 2

## 2022-08-01 MED ORDER — TETRACAINE HCL 0.5 % OP SOLN
2.0000 [drp] | Freq: Once | OPHTHALMIC | Status: AC
  Administered 2022-08-01: 2 [drp] via OPHTHALMIC
  Filled 2022-08-01: qty 4

## 2022-08-01 MED ORDER — INSULIN ASPART 100 UNIT/ML IJ SOLN
0.0000 [IU] | Freq: Three times a day (TID) | INTRAMUSCULAR | Status: DC
  Administered 2022-08-02: 5 [IU] via SUBCUTANEOUS
  Administered 2022-08-02: 11 [IU] via SUBCUTANEOUS

## 2022-08-01 MED ORDER — HEPARIN SODIUM (PORCINE) 5000 UNIT/ML IJ SOLN
5000.0000 [IU] | Freq: Three times a day (TID) | INTRAMUSCULAR | Status: DC
  Administered 2022-08-01 – 2022-08-03 (×4): 5000 [IU] via SUBCUTANEOUS
  Filled 2022-08-01 (×4): qty 1

## 2022-08-01 MED ORDER — MIDODRINE HCL 5 MG PO TABS
10.0000 mg | ORAL_TABLET | ORAL | Status: DC
  Administered 2022-08-02: 10 mg via ORAL
  Filled 2022-08-01: qty 2

## 2022-08-01 MED ORDER — BICTEGRAVIR-EMTRICITAB-TENOFOV 50-200-25 MG PO TABS
1.0000 | ORAL_TABLET | Freq: Every day | ORAL | Status: DC
  Administered 2022-08-02 – 2022-08-03 (×2): 1 via ORAL
  Filled 2022-08-01 (×2): qty 1

## 2022-08-01 MED ORDER — ALBUTEROL SULFATE (2.5 MG/3ML) 0.083% IN NEBU
10.0000 mg | INHALATION_SOLUTION | Freq: Once | RESPIRATORY_TRACT | Status: AC
  Administered 2022-08-01: 10 mg via RESPIRATORY_TRACT
  Filled 2022-08-01: qty 12

## 2022-08-01 MED ORDER — CALCIUM GLUCONATE-NACL 1-0.675 GM/50ML-% IV SOLN
1.0000 g | Freq: Once | INTRAVENOUS | Status: AC
  Administered 2022-08-01: 1000 mg via INTRAVENOUS
  Filled 2022-08-01: qty 50

## 2022-08-01 MED ORDER — PRAVASTATIN SODIUM 40 MG PO TABS
80.0000 mg | ORAL_TABLET | Freq: Every day | ORAL | Status: DC
  Administered 2022-08-02: 80 mg via ORAL
  Filled 2022-08-01: qty 2

## 2022-08-01 MED ORDER — PITAVASTATIN MAGNESIUM 4 MG PO TABS: 4.0000 mg | ORAL_TABLET | Freq: Every day | ORAL | Status: DC

## 2022-08-01 NOTE — ED Notes (Signed)
ED TO INPATIENT HANDOFF REPORT  ED Nurse Name and Phone #: Jonelle Sidle [phone]  S Name/Age/Gender Alvin Daniels 52 y.o. male Room/Bed: 011C/011C  Code Status   Code Status: Full Code  Home/SNF/Other Home Patient oriented to: self, place, time, and situation Is this baseline? Yes   Triage Complete: Triage complete  Chief Complaint Hyperkalemia [E87.5]  Triage Note Triage done using spanish Ipad interpreter: Pt reports left eye pain that started about a week ago associated with a headache that has worsened over the last 3 days. He presents with his left eye closed and reports blurry vision. Fritz Pickerel, [place] opened his eye and his left eye appears cloudy with a horizontal band across his sclera. Denies injury.  He was sent here by urgent car to rule out temporal arteritis.    Allergies No Known Allergies  Level of Care/Admitting Diagnosis ED Disposition     ED Disposition  Admit   Condition  --   Comment  Hospital Area: [facility] [[id number]]  Level of Care: Telemetry Medical [104]  May place patient in observation at [facility] or [place] if equivalent level of care is available:: No  Covid Evaluation: Asymptomatic - no recent exposure (last 10 days) testing not required  Diagnosis: Hyperkalemia [id number]  Admitting Physician: Kayleen Memos [id number]  Attending Physician: Kayleen Memos [id number]          B Medical/Surgery History Past Medical History:  Diagnosis Date   Acute bilateral low back pain 01/03/2021   AIDS ([place]) 11/22/2014   Back pain 06/09/2019   BKA stump complication ([place]) [id number]   Chronic diarrhea    Chronic hepatitis C without hepatic coma ([place]) 11/22/2014   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   Diabetic neuropathy ([place])    ESRD (end stage renal disease) on dialysis ([place])    "TTS; don't remember street name" (05/03/2014)   Hepatitis C    HIV INFECTION 06/27/2010   Qualifier: Diagnosis of  By: Nickola Major  CMA ( AAMA), Jacqueline     Hypotension 0000000   Metabolic bone disease [id number]   MRSA infection    Normocytic anemia 06/17/2012   Pancreatitis    Pressure ulcer of BKA stump, stage 2 ([place]) 11/22/2014   Renal disorder    Renal insufficiency    Severe protein-calorie malnutrition ([place]) 06/17/2012   Uncontrolled diabetes mellitus with complications [id number]   Annotation: uncontrolled Qualifier: Diagnosis of  By: Nickola Major CMA Deborra Medina), Jacqueline     Vaccine counseling 05/29/2022   Past Surgical History:  Procedure Laterality Date   AMPUTATION Left 04/20/2014   Procedure: 3rd toe amputation, 4th Toe Amputation,  5th Toe Amputation;  Surgeon: Newt Minion, MD;  Location: [place];  Service: Orthopedics;  Laterality: Left;   AMPUTATION Left 05/02/2014   Procedure: Midfoot Amputation;  Surgeon: Newt Minion, MD;  Location: [place];  Service: Orthopedics;  Laterality: Left;   AMPUTATION Left 06/17/2014   Procedure: AMPUTATION BELOW KNEE;  Surgeon: Newt Minion, MD;  Location: [facility];  Service: Orthopedics;  Laterality: Left;   AMPUTATION Right 09/04/2018   Procedure: RIGHT BELOW KNEE AMPUTATION;  Surgeon: Newt Minion, MD;  Location: [facility];  Service: Orthopedics;  Laterality: Right;  RIGHT BELOW KNEE AMPUTATION   AV FISTULA PLACEMENT Left    AV FISTULA PLACEMENT Left 05/10/2016   Procedure: Creation Left Arm Brachiocephalic Arteriovenous Fistula and Ligation of Radiocephalic Fistula;  Surgeon: Angelia Mould, MD;  Location: [facility];  Service: Vascular;  Laterality: Left;   FEMUR IM NAIL Left 08/17/2016   Procedure: INTRAMEDULLARY (IM) RETROGRADE FEMORAL NAILING;  Surgeon: Marybelle Killings, MD;  Location: [facility];  Service: Orthopedics;  Laterality: Left;   FOOT AMPUTATION THROUGH ANKLE Left 12/'21/2015   midfoot   IR GENERIC HISTORICAL Left 05/01/2016   IR THROMBECTOMY AV FISTULA W/THROMBOLYSIS/PTA INC/SHUNT/IMG LEFT 05/01/2016 [name], MD [facility]   IR GENERIC HISTORICAL   05/01/2016   IR US GUIDE VASC ACCESS LEFT 05/01/2016 [name], MD [facility]   IR GENERIC HISTORICAL  05/07/2016   IR FLUORO GUIDE CV LINE RIGHT 05/07/2016 Corrie Mckusick, DO [facility]   IR GENERIC HISTORICAL  05/07/2016   IR US GUIDE VASC ACCESS RIGHT 05/07/2016 Corrie Mckusick, DO [facility]   IR GENERIC HISTORICAL  05/22/2016   IR US GUIDE VASC ACCESS RIGHT 05/22/2016 Greggory Keen, MD [facility]   IR GENERIC HISTORICAL  05/22/2016   IR FLUORO GUIDE CV LINE RIGHT 05/22/2016 Greggory Keen, MD [facility]   IR REMOVAL TUN CV CATH W/O [facility]  08/21/2016   PERIPHERAL VASCULAR CATHETERIZATION Left 05/09/2016   Procedure: A/V Fistulagram;  Surgeon: Angelia Mould, MD;  Location: [facility] CV [facility];  Service: Cardiovascular;  Laterality: Left;  arm   TEE WITHOUT CARDIOVERSION N/A 06/22/2018   Procedure: TRANSESOPHAGEAL ECHOCARDIOGRAM (TEE);  Surgeon: Jerline Pain, MD;  Location: [facility] ENDOS[facility];  Service: Cardiovascular;  Laterality: N/A;     A IV Location/Drains/Wounds Patient Lines/Drains/Airways Status     Active Line/Drains/Airways     Name Placement date Placement time Site Days   Fistula / Graft Left Upper arm Arteriovenous fistula --  --  Upper arm  --   Wound / Incision (Open or Dehisced) 12/09/20 Other (Comment) Other (Comment) Upper;Anterior bump ( open but scabbed over) 12/09/20  1316  Other (Comment)  600            Intake/Output Last 24 hours No intake or output data in the 24 hours ending 08/01/22 2324  Labs/Imaging Results for orders placed or performed during the hospital encounter of 08/01/22 (from the past 48 hour(s))  Basic metabolic panel     Status: Abnormal   Collection Time: 08/01/22  6:07 PM  Result Value Ref Range   Sodium 127 (L) 135 - 145 mmol/L   Potassium 6.5 (HH) 3.5 - 5.1 mmol/L    Comment: CRITICAL RESULT CALLED TO, READ BACK BY AND VERIFIED WITH J [facility] 1922 08/01/2022 WBOND   Chloride 88 (L) 98 - 111 mmol/L   CO2 26 22 - 32  mmol/L   Glucose, Bld 588 (HH) 70 - 99 mg/dL    Comment: CRITICAL RESULT CALLED TO, READ BACK BY AND VERIFIED [place] 08/01/2022 WBOND Glucose reference range applies only to samples taken after fasting for at least 8 hours.    BUN 35 (H) 6 - 20 mg/dL   Creatinine, Ser 9.05 (H) 0.61 - 1.24 mg/dL   Calcium 8.9 8.9 - 10.3 mg/dL   GFR, Estimated 6 (L) >60 mL/min    Comment: (NOTE) Calculated using the CKD-EPI Creatinine Equation (2021)    Anion gap 13 5 - 15    Comment: Performed at [facility] [address] [place], [place] [address]  CBC with Differential     Status: Abnormal   Collection Time: 08/01/22  6:07 PM  Result Value Ref Range   WBC 7.9 4.0 - 10.5 K/uL   RBC 3.83 (L) 4.22 -  5.81 MIL/uL   Hemoglobin 12.6 (L) 13.0 - 17.0 g/dL   HCT 36.9 (L) 39.0 - 52.0 %   MCV 96.3 80.0 - 100.0 fL   MCH 32.9 26.0 - 34.0 pg   MCHC 34.1 30.0 - 36.0 g/dL   RDW 12.5 11.5 - 15.5 %   Platelets 193 150 - 400 K/uL   nRBC 0.0 0.0 - 0.2 %   Neutrophils Relative % 70 %   Neutro Abs 5.5 1.7 - 7.7 K/uL   Lymphocytes Relative 16 %   Lymphs Abs 1.3 0.7 - 4.0 K/uL   Monocytes Relative 7 %   Monocytes Absolute 0.5 0.1 - 1.0 K/uL   Eosinophils Relative 6 %   Eosinophils Absolute 0.5 0.0 - 0.5 K/uL   Basophils Relative 1 %   Basophils Absolute 0.1 0.0 - 0.1 K/uL   Immature Granulocytes 0 %   Abs Immature Granulocytes 0.02 0.00 - 0.07 K/uL    Comment: Performed at [facility] [address], [place], [place] [address]  Sedimentation rate     Status: Abnormal   Collection Time: 08/01/22  6:07 PM  Result Value Ref Range   Sed Rate 52 (H) 0 - 16 mm/hr    Comment: Performed at [facility] [address] [place], [place] [address]  CBG monitoring, ED     Status: Abnormal   Collection Time: 08/01/22  9:05 PM  Result Value Ref Range   Glucose-Capillary 512 (HH) 70 - 99 mg/dL    Comment: Glucose reference range applies only to samples taken after fasting for at  least 8 hours.   Comment 1 Document in Chart    DG Chest Portable 1 View  Result Date: 08/01/2022 CLINICAL DATA:  End-stage renal disease EXAM: PORTABLE CHEST 1 VIEW COMPARISON:  12/08/2020 FINDINGS: The heart size and mediastinal contours are within normal limits. Both lungs are clear. minimal scarring or atelectasis left base. The visualized skeletal structures are unremarkable. IMPRESSION: No active disease. Minimal scarring or atelectasis at the left base. Electronically Signed   By: Donavan Foil M.D.   On: 08/01/2022 21:13    Pending Labs Unresulted Labs (From admission, onward)     Start     Ordered   08/02/22 0500  CBC  Tomorrow morning,   R        08/01/22 2307   08/02/22 0500  Comprehensive metabolic panel  Tomorrow morning,   R        08/01/22 2307   08/02/22 0500  Magnesium  Tomorrow morning,   R        08/01/22 2307   08/02/22 0500  Phosphorus  Tomorrow morning,   R        08/01/22 2307   08/01/22 2307  C-reactive protein  Add-on,   AD        08/01/22 2307            Vitals/Pain Today's Vitals   08/01/22 2200 08/01/22 2206 08/01/22 2231 08/01/22 2240  BP: 131/73   98/78  Pulse: 82  88 83  Resp: (!) 22  16 (!) 22  Temp:  98.2 F (36.8 C)    TempSrc:  Oral    SpO2: 97%   100%  Weight:      Height:      PainSc:        Isolation Precautions No active isolations  Medications Medications  tetracaine (PONTOCAINE) 0.5 % ophthalmic solution 2 drop (has no administration in time range)  sodium zirconium cyclosilicate (LOKELMA) packet 10 g (has no administration in time range)  insulin aspart (novoLOG) injection 5 Units (has no administration in time range)  calcium gluconate 1 g/ 50 mL sodium chloride IVPB (has no administration in time range)  heparin injection 5,000 Units (has no administration in time range)  bictegravir-emtricitabine-tenofovir AF (BIKTARVY) 50-200-25 MG per tablet 1 tablet (has no administration in time range)  lanthanum (FOSRENOL)  chewable tablet 1,000 mg (has no administration in time range)  midodrine (PROAMATINE) tablet 10 mg (has no administration in time range)  insulin aspart (novoLOG) injection 0-15 Units (has no administration in time range)  insulin aspart (novoLOG) injection 0-5 Units (has no administration in time range)  pravastatin (PRAVACHOL) tablet 80 mg (has no administration in time range)  albuterol (PROVENTIL) (2.5 MG/3ML) 0.083% nebulizer solution 10 mg (10 mg Nebulization Given 08/01/22 2230)    Mobility walks with device     Focused Assessments    R Recommendations: See Admitting Provider Note  Report given to:   Additional Notes: a/ox4. Bilateral AKA w/ prosthetics and walker. MWF Dialysis. Speaks spanish    Ill give the meds once we get IV access. Still working on getting access

## 2022-08-01 NOTE — ED Triage Notes (Signed)
Triage done using spanish Ipad interpreter: Pt reports left eye pain that started about a week ago associated with a headache that has worsened over the last 3 days. He presents with his left eye closed and reports blurry vision. Alvin Daniels, [place] opened his eye and his left eye appears cloudy with a horizontal band across his sclera. Denies injury.  He was sent here by urgent car to rule out temporal arteritis.

## 2022-08-01 NOTE — ED Provider Triage Note (Signed)
Emergency Medicine Provider Triage Evaluation Note  Terrill Makovec , a 52 y.o. male  was evaluated in triage.  Pt complains of left-sided eye pain.  Patient states his vision is blurry.  Left eye appears cloudy with a horizontal band across the iris.  He was evaluated in urgent care who was concerned about possible giant cell arteritis.  Patient a headache which is been ongoing for the past 3 days or more, and has worsened in severity.  He denies any injury to his head or eye.  Review of Systems  Positive: As above Negative: As above  Physical Exam  There were no vitals taken for this visit. Gen:   Awake, no distress   Resp:  Normal effort  MSK:   Moves extremities without difficulty  Other:    Medical Decision Making  Medically screening exam initiated at 5:57 PM.  Appropriate orders placed.  Izac Margrave was informed that the remainder of the evaluation will be completed by another provider, this initial triage assessment does not replace that evaluation, and the importance of remaining in the ED until their evaluation is complete.     Dorothyann Peng, PA-C 08/01/22 1810

## 2022-08-01 NOTE — ED Provider Notes (Signed)
[facility] Provider Note   CSN: [id number] Arrival date & time: 08/01/22  1752     History  Chief Complaint  Patient presents with   Eye Pain    Alvin Daniels is a 52 y.o. male. Patient speaks Spanish and Child psychotherapist was used.  Eye Pain  Patient presents with left-sided eye pain.  Has had 4 days now per patient however reviewing notes it appears that he has had this for years.  Discussed with patient and family member.  Reportedly has seen ophthalmology for this.  It was one of the doctors on [employer].  States that they wanted surgery so he did not go blind with no surgery was done.  Patient has not been able to see out of that eye much for a while now.  Pain in the left side of his head also.  History of same with this.  Reviewing notes has had CTs to evaluate in the past.  Seen at urgent care and sent here to rule out temporal arteritis.  However is a dialysis patient and diabetic was dialyzed yesterday and is due for dialysis tomorrow.    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   Diabetic neuropathy ([place])    ESRD (end stage renal disease) on dialysis ([place])    "TTS; don't remember street name" (05/03/2014)   Hepatitis C    HIV INFECTION 06/27/2010   Qualifier: Diagnosis of  By: Nickola Major CMA ( AAMA), Jacqueline     Hypotension 0000000   Metabolic bone disease [id number]   MRSA infection    Normocytic anemia 06/17/2012   Pancreatitis    Pressure ulcer of BKA stump, stage 2 ([place]) 11/22/2014   Renal disorder    Renal insufficiency    Severe protein-calorie malnutrition ([place]) 06/17/2012   Uncontrolled diabetes mellitus with complications [id number]    Annotation: uncontrolled Qualifier: Diagnosis of  By: Nickola Major CMA ( AAMA), Jacqueline     Vaccine counseling 05/29/2022    Home Medications Prior to Admission medications   Medication Sig Start Date End Date Taking? Authorizing Provider  bictegravir-emtricitabine-tenofovir AF (BIKTARVY) 50-200-25 MG TABS tablet Take 1 tablet by mouth daily. 05/29/22   Truman Hayward, MD  Blood Glucose Monitoring Suppl (TRUE METRIX METER) w/Device KIT Use kit to check glucose 3 (three) times daily. 12/27/21   [name], MD  FOSRENOL 500 MG chewable tablet Chew 1,000 mg by mouth 3 (three) times daily. 05/30/21   [provider]  glucose blood (TRUE METRIX BLOOD GLUCOSE TEST) test strip use 1 test strip to check glucose 3 (three) times daily. 12/27/21   [name], MD  Insulin Glargine (BASAGLAR KWIKPEN) 100 UNIT/ML Inject 16 Units into the skin daily. Patient not taking: Reported on 05/29/2022 12/27/21   [name], MD  insulin NPH-regular Human (HUMULIN 70/30) (70-30) 100 UNIT/ML injection Inject 20 units subcutaneously twice daily with meals 06/26/22   Dorna Mai, MD  Insulin Syringe-Needle U-100 (BD INSULIN SYRINGE ULTRAFINE) 31G X 15/64" 0.5 ML MISC 1 each by Does not apply route 2 (two) times daily. 02/10/20   [name], MD  iron sucrose in sodium chloride 0.9 % 100 mL Iron Sucrose (Venofer) Patient  not taking: Reported on 01/02/2022 10/31/21 10/30/22  [provider]  lanthanum (FOSRENOL) 500 MG chewable tablet Chew by mouth. Patient not taking: Reported on 01/02/2022 10/15/21   [provider]  LOPERAMIDE HCL PO Take by mouth. Patient not taking: Reported on 01/02/2022 09/03/21 09/02/22  [provider]  Methoxy PEG-Epoetin Beta (MIRCERA IJ) Mircera Patient not taking: Reported on 01/02/2022 11/14/21 11/13/22  [provider]  midodrine (PROAMATINE) 10 MG tablet Take 1 tablet (10 mg total) by mouth 3 (three) times daily with meals. In Spanish  01/02/22   Tommy Medal, Lavell Islam, MD  midodrine (PROAMATINE) 10 MG tablet Take 1 tablet by mouth three times a week as directed Take 1 tablet 30 minutes prior to HD and 1 tab during treatment. 02/25/22     midodrine (PROAMATINE) 10 MG tablet Take 1 tablet by mouth three times a week as directed, then take 1 tablet 30 minutes prior to HD and 1 tab during treatment 03/27/22     midodrine (PROAMATINE) 10 MG tablet Take 1 tablet by mouth three times a week as directed Take 1 tablet 30 minutes prior to HD and 1 tab during treatment 03/27/22   Dorna Mai, MD  Pitavastatin Magnesium 4 MG TABS Take 4 mg by mouth daily. 05/29/22   Truman Hayward, MD  TRUEplus Lancets 28G MISC Use to test 3 (three) times daily. 12/27/21   [name], MD  bictegravir-emtricitabine-tenofovir AF (BIKTARVY) 50-200-25 MG TABS tablet Take 1 tablet by mouth daily. 01/02/22   Truman Hayward, MD      Allergies    Patient has no known allergies.    Review of Systems   Review of Systems  Eyes:  Positive for pain.    Physical Exam Updated Vital Signs BP 131/73   Pulse 88   Temp 98.2 F (36.8 C) (Oral)   Resp 16   Ht 5\' 2"  (1.575 m)   Wt 49.9 kg   SpO2 97%   BMI 20.12 kg/m  Physical Exam Vitals and nursing note reviewed.  Eyes:     Comments: Left eye held shut.  There is cloudiness on the left cornea.  There is a horizontal line also across that is approximately 2 to 3 mm wide.  Pupil is somewhat dilated.  Decreased reactive nodes.  Lens appears very cloudy.  Eye movements intact.  Intraocular pressure between 30 and 45.  Cardiovascular:     Rate and Rhythm: Regular rhythm.  Musculoskeletal:        General: No tenderness.     Cervical back: Neck supple.  Skin:    General: Skin is warm.     Capillary Refill: Capillary refill takes less than 2 seconds.  Neurological:     Mental Status: He is alert and oriented to person, place, and time. Mental status is at baseline.     ED Results / Procedures /  Treatments   Labs (all labs ordered are listed, but only abnormal results are displayed) Labs Reviewed  BASIC METABOLIC PANEL - Abnormal; Notable for the following components:      Result Value   Sodium 127 (*)    Potassium 6.5 (*)    Chloride 88 (*)    Glucose, Bld 588 (*)    BUN 35 (*)    Creatinine, Ser 9.05 (*)    GFR, Estimated 6 (*)    All other components within normal limits  CBC WITH DIFFERENTIAL/PLATELET - Abnormal; Notable for the following components:  RBC 3.83 (*)    Hemoglobin 12.6 (*)    HCT 36.9 (*)    All other components within normal limits  SEDIMENTATION RATE - Abnormal; Notable for the following components:   Sed Rate 52 (*)    All other components within normal limits  CBG MONITORING, ED - Abnormal; Notable for the following components:   Glucose-Capillary 512 (*)    All other components within normal limits  C-REACTIVE PROTEIN    EKG EKG Interpretation  Date/Time:  Thursday August 01 2022 21:14:07 EDT Ventricular Rate:  90 PR Interval:  141 QRS Duration: 105 QT Interval:  361 QTC Calculation: 442 R Axis:   185 Text Interpretation: Sinus rhythm Right axis deviation T waves more peaked Reconfirmed by Davonna Belling [phone]) on 08/01/2022 10:51:21 PM  Radiology DG Chest Portable 1 View  Result Date: 08/01/2022 CLINICAL DATA:  End-stage renal disease EXAM: PORTABLE CHEST 1 VIEW COMPARISON:  12/08/2020 FINDINGS: The heart size and mediastinal contours are within normal limits. Both lungs are clear. minimal scarring or atelectasis left base. The visualized skeletal structures are unremarkable. IMPRESSION: No active disease. Minimal scarring or atelectasis at the left base. Electronically Signed   By: Donavan Foil M.D.   On: 08/01/2022 21:13    Procedures Procedures    Medications Ordered in ED Medications  tetracaine (PONTOCAINE) 0.5 % ophthalmic solution 2 drop (has no administration in time range)  sodium zirconium cyclosilicate (LOKELMA)  packet 10 g (has no administration in time range)  insulin aspart (novoLOG) injection 5 Units (has no administration in time range)  calcium gluconate 1 g/ 50 mL sodium chloride IVPB (has no administration in time range)  albuterol (PROVENTIL) (2.5 MG/3ML) 0.083% nebulizer solution 10 mg (10 mg Nebulization Given 08/01/22 2230)    ED Course/ Medical Decision Making/ A&P                             Medical Decision Making Amount and/or Complexity of Data Reviewed Radiology: ordered.  Risk OTC drugs. Prescription drug management.   Patient with left eye pain.  Also left temple pain.  Has had similar pain in the past reviewing notes.  Sent to rule out temporal arteritis.  However does have history of similar pains for what appears to be years now.Marland Kitchen  He is young to have giant cell arteritis also.  However does have decreased vision in the eye that appears to be chronic.  Reportedly has not been able to use that much for a while now.  Has seen ophthalmology in the past and reportedly needed a surgery that he not have.  Differential diagnosis includes glaucoma, cataracts.  I reviewed previous CT scan and PCP notes.  Discussed with Dr. [name] from ophthalmology.  Does not need to be seen acutely.  Already severely decreased vision.  Can follow-up as an outpatient with him.  Also discussed with Dr. Hollie Salk from nephrology.  Is hyperglycemic but also hyperkalemic with potassium of 6.5.  Chest x-ray reassuring but EKG does show peaked T waves.  I think he needs urgent dialysis but not emergent overnight.  Discussed with her and will temporize with [facility] tonight and another dose both for morning.  Will also give albuterol, and insulin and calcium  CRITICAL CARE Performed by: Davonna Belling Total critical care time: 30 minutes Critical care time was exclusive of separately billable procedures and treating other patients. Critical care was necessary to treat or prevent imminent or life-threatening  deterioration. Critical care was time spent personally by me on the following activities: development of treatment plan with patient and/or surrogate as well as nursing, discussions with consultants, evaluation of patient's response to treatment, examination of patient, obtaining history from patient or surrogate, ordering and performing treatments and interventions, ordering and review of laboratory studies, ordering and review of radiographic studies, pulse oximetry and re-evaluation of patient's condition.          Final Clinical Impression(s) / ED Diagnoses Final diagnoses:  Pain of left eye  Hyperglycemia  End stage renal disease on dialysis [facility])    Rx / DC Orders ED Discharge Orders     None         Davonna Belling, MD 08/01/22 2254

## 2022-08-01 NOTE — H&P (Addendum)
History and Physical  Alvin Daniels [id number] DOB: 03-16-1971 DOA: 08/01/2022  Referring physician: Dr. Alvino Chapel, [place]. PCP: Dorna Mai, MD  Outpatient Specialists: Nephrology, infectious disease, orthopedic surgery. Patient coming from: Home through urgent care.  Chief Complaint: Left eye pain since, sent from urgent care to rule out temporal arteritis.  Interview performed using Spanish interpreter York Cerise [phone].  HPI: Alvin Daniels is a 52 y.o. male with medical history significant for type 2 diabetes, HIV, ESRD on HD MWF, last hemodialysis was on Wednesday, who presented to [facility] ED sent from urgent care to rule out left temporal arteritis due to left eye pain, left temporal artery pain, and blurry vision.  Reportedly, the patient has been seen by ophthalmology for the same complaint outpatient.    EDP discussed the case with ophthalmology, Dr. [name].  Per ophthalmology the patient does not need to be seen acutely, can follow-up as an outpatient with them.    EDP also discussed the case with nephrology due to severe hyperkalemia with a potassium of 6.5 and EKG abnormality.  No need for emergent hemodialysis overnight per nephrology, okay to treat medically.  His hyperkalemia was treated with Lokelma, IV insulin, IV calcium gluconate, IV D50, and albuterol.  TRH, hospitalist service, was asked to admit for hyperkalemia.  Serum potassium 6.5, serum glucose 588, BUN 35, creatinine 9.92,  ED Course: Tmax 98.9.  BP 100/45, pulse 109, respiratory 18, saturation 97% on room air.  Lab studies remarkable for hemoglobin 11.3.  Serum potassium 6.5, serum glucose 588.  Review of Systems: Review of systems as noted in the HPI. All other systems reviewed and are negative.   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[place])    Right   Diabetic neuropathy ([place])    ESRD (end stage renal disease) on dialysis ([place])    "TTS; don't remember street name" (05/03/2014)   Hepatitis C    HIV INFECTION 06/27/2010   Qualifier: Diagnosis of  By: Nickola Major CMA ( AAMA), Jacqueline     Hypotension 0000000   Metabolic bone disease [id number]   MRSA infection    Normocytic anemia 06/17/2012   Pancreatitis    Pressure ulcer of BKA stump, stage 2 ([place]) 11/22/2014   Renal disorder    Renal insufficiency    Severe protein-calorie malnutrition ([place]) 06/17/2012   Uncontrolled diabetes mellitus with complications [id number]   Annotation: uncontrolled Qualifier: Diagnosis of  By: Nickola Major CMA Deborra Medina), Jacqueline     Vaccine counseling 05/29/2022   Past Surgical History:  Procedure Laterality Date   AMPUTATION Left 04/20/2014   Procedure: 3rd toe amputation, 4th Toe Amputation,  5th Toe Amputation;  Surgeon: Newt Minion, MD;  Location: [place];  Service: Orthopedics;  Laterality: Left;   AMPUTATION Left 05/02/2014   Procedure: Midfoot Amputation;  Surgeon: Newt Minion, MD;  Location: [place];  Service: Orthopedics;  Laterality: Left;   AMPUTATION Left 06/17/2014   Procedure: AMPUTATION BELOW KNEE;  Surgeon: Newt Minion, MD;  Location: [place];  Service: Orthopedics;  Laterality: Left;   AMPUTATION Right 09/04/2018   Procedure: RIGHT BELOW KNEE AMPUTATION;  Surgeon: Newt Minion, MD;  Location: [place];  Service: Orthopedics;  Laterality: Right;  RIGHT BELOW KNEE AMPUTATION  AV FISTULA PLACEMENT Left    AV FISTULA PLACEMENT Left 05/10/2016   Procedure: Creation Left Arm Brachiocephalic Arteriovenous Fistula and Ligation of Radiocephalic Fistula;  Surgeon: Angelia Mould, MD;  Location: [name];  Service: Vascular;  Laterality: Left;   FEMUR IM NAIL Left 08/17/2016   Procedure: INTRAMEDULLARY (IM) RETROGRADE FEMORAL NAILING;   Surgeon: Marybelle Killings, MD;  Location: [place];  Service: Orthopedics;  Laterality: Left;   FOOT AMPUTATION THROUGH ANKLE Left 12/'21/2015   midfoot   IR GENERIC HISTORICAL Left 05/01/2016   IR THROMBECTOMY AV FISTULA W/THROMBOLYSIS/PTA INC/SHUNT/IMG LEFT 05/01/2016 [name], MD [facility]   IR GENERIC HISTORICAL  05/01/2016   IR US GUIDE VASC ACCESS LEFT 05/01/2016 [name], MD [facility]   IR GENERIC HISTORICAL  05/07/2016   IR FLUORO GUIDE CV LINE RIGHT 05/07/2016 Corrie Mckusick, DO [facility]   IR GENERIC HISTORICAL  05/07/2016   IR US GUIDE VASC ACCESS RIGHT 05/07/2016 Corrie Mckusick, DO [facility]   IR GENERIC HISTORICAL  05/22/2016   IR US GUIDE VASC ACCESS RIGHT 05/22/2016 Greggory Keen, MD [facility]   IR GENERIC HISTORICAL  05/22/2016   IR FLUORO GUIDE CV LINE RIGHT 05/22/2016 Greggory Keen, MD [facility]   IR REMOVAL TUN CV CATH W/O [facility]  08/21/2016   PERIPHERAL VASCULAR CATHETERIZATION Left 05/09/2016   Procedure: A/V Fistulagram;  Surgeon: Angelia Mould, MD;  Location: [place] CV [facility];  Service: Cardiovascular;  Laterality: Left;  arm   TEE WITHOUT CARDIOVERSION N/A 06/22/2018   Procedure: TRANSESOPHAGEAL ECHOCARDIOGRAM (TEE);  Surgeon: Jerline Pain, MD;  Location: [facility] ENDOSCOPY;  Service: Cardiovascular;  Laterality: N/A;    Social History:  reports that he has never smoked. He has never used smokeless tobacco. He reports that he does not drink alcohol and does not use drugs.   No Known Allergies  Family History  Problem Relation Age of Onset   Diabetes Mother    Diabetes Father       Prior to Admission medications   Medication Sig Start Date End Date Taking? Authorizing Provider  bictegravir-emtricitabine-tenofovir AF (BIKTARVY) 50-200-25 MG TABS tablet Take 1 tablet by mouth daily. 05/29/22   Truman Hayward, MD  Blood Glucose Monitoring Suppl (TRUE METRIX METER) w/Device KIT Use kit to check glucose 3 (three) times daily. 12/27/21    [name], MD  FOSRENOL 500 MG chewable tablet Chew 1,000 mg by mouth 3 (three) times daily. 05/30/21   [provider]  glucose blood (TRUE METRIX BLOOD GLUCOSE TEST) test strip use 1 test strip to check glucose 3 (three) times daily. 12/27/21   [name], MD  Insulin Glargine (BASAGLAR KWIKPEN) 100 UNIT/ML Inject 16 Units into the skin daily. Patient not taking: Reported on 05/29/2022 12/27/21   [name], MD  insulin NPH-regular Human (HUMULIN 70/30) (70-30) 100 UNIT/ML injection Inject 20 units subcutaneously twice daily with meals 06/26/22   Dorna Mai, MD  Insulin Syringe-Needle U-100 (BD INSULIN SYRINGE ULTRAFINE) 31G X 15/64" 0.5 ML MISC 1 each by Does not apply route 2 (two) times daily. 02/10/20   [name], MD  iron sucrose in sodium chloride 0.9 % 100 mL Iron Sucrose (Venofer) Patient not taking: Reported on 01/02/2022 10/31/21 10/30/22  [provider]  lanthanum (FOSRENOL) 500 MG chewable tablet Chew by mouth. Patient not taking: Reported on 01/02/2022 10/15/21   [provider]  LOPERAMIDE HCL PO Take by mouth. Patient not taking: Reported on 01/02/2022 09/03/21 09/02/22  [provider]  Methoxy PEG-Epoetin Beta (MIRCERA IJ) Mircera Patient not taking: Reported on 01/02/2022 11/14/21 11/13/22  [provider]  midodrine (PROAMATINE) 10 MG tablet Take 1 tablet (10 mg total) by mouth 3 (three) times daily with meals. In Spanish 01/02/22   Tommy Medal, Lavell Islam, MD  midodrine (PROAMATINE) 10 MG tablet Take 1 tablet by mouth three times a week as directed Take 1 tablet 30 minutes prior to HD and 1 tab during treatment. 02/25/22     midodrine (PROAMATINE) 10 MG tablet Take 1 tablet by mouth three times a week as directed, then take 1 tablet 30 minutes prior to HD and 1 tab during treatment 03/27/22     midodrine (PROAMATINE) 10 MG tablet Take 1 tablet by mouth three times a week as directed Take 1 tablet 30 minutes prior to HD and 1 tab  during treatment 03/27/22   Dorna Mai, MD  Pitavastatin Magnesium 4 MG TABS Take 4 mg by mouth daily. 05/29/22   Truman Hayward, MD  TRUEplus Lancets 28G MISC Use to test 3 (three) times daily. 12/27/21   [name], MD  bictegravir-emtricitabine-tenofovir AF (BIKTARVY) 50-200-25 MG TABS tablet Take 1 tablet by mouth daily. 01/02/22   Truman Hayward, MD    Physical Exam: BP 98/78   Pulse 83   Temp 98.2 F (36.8 C) (Oral)   Resp (!) 22   Ht 5\' 2"  (1.575 m)   Wt 49.9 kg   SpO2 100%   BMI 20.12 kg/m   General: 52 y.o. year-old male well developed well nourished in no acute distress.  Alert and oriented x3. Cardiovascular: Regular rate and rhythm with no rubs or gallops.  No thyromegaly or JVD noted.  No lower extremity edema.  Respiratory: Clear to auscultation with no wheezes or rales. Good inspiratory effort. Abdomen: Soft nontender nondistended with normal bowel sounds x4 quadrants. Muskuloskeletal: Bilateral below the knee amputation. Neuro: CN II-XII intact, strength, sensation, reflexes Skin: No ulcerative lesions noted or rashes Psychiatry: Judgement and insight appear normal. Mood is appropriate for condition and setting          Labs on Admission:  Basic Metabolic Panel: Recent Labs  Lab 08/01/22 1807  NA 127*  K 6.5*  CL 88*  CO2 26  GLUCOSE 588*  BUN 35*  CREATININE 9.05*  CALCIUM 8.9   Liver Function Tests: No results for input(s): "AST", "ALT", "ALKPHOS", "BILITOT", "PROT", "ALBUMIN" in the last 168 hours. No results for input(s): "LIPASE", "AMYLASE" in the last 168 hours. No results for input(s): "AMMONIA" in the last 168 hours. CBC: Recent Labs  Lab 08/01/22 1807  WBC 7.9  NEUTROABS 5.5  HGB 12.6*  HCT 36.9*  MCV 96.3  PLT 193   Cardiac Enzymes: No results for input(s): "CKTOTAL", "CKMB", "CKMBINDEX", "TROPONINI" in the last 168 hours.  BNP (last 3 results) No results for input(s): "BNP" in the last 8760 hours.  ProBNP  (last 3 results) No results for input(s): "PROBNP" in the last 8760 hours.  CBG: Recent Labs  Lab 08/01/22 2105  GLUCAP 512*    Radiological Exams on Admission: DG Chest Portable 1 View  Result Date: 08/01/2022 CLINICAL DATA:  End-stage renal disease EXAM: PORTABLE CHEST 1 VIEW COMPARISON:  12/08/2020 FINDINGS: The heart size and mediastinal contours are within normal limits. Both lungs are clear. minimal scarring or atelectasis left base. The visualized skeletal structures are unremarkable. IMPRESSION: No active disease. Minimal scarring or atelectasis at the left base. Electronically  Signed   By: Donavan Foil M.D.   On: 08/01/2022 21:13    EKG: I independently viewed the EKG done and my findings are as followed: Sinus rhythm rate of 90.  Nonspecific ST-T changes.  QTc 442.  Assessment/Plan Present on Admission:  Hyperkalemia  Principal Problem:   Hyperkalemia  Severe hyperkalemia in the setting of ESRD on HD MWF Presented with serum potassium of 6.5.  Peaked T waves on twelve-lead EKG Last hemodialysis was on Wednesday, 07/31/2022. IV insulin, IV D50, calcium gluconate, Lokelma Repeat potassium level Plan hemodialysis on 08/02/2022.  Left eye pain, left temporal artery pain, blurry vision, concern for possible left temporal arteritis/giant cell arteritis Sent to the ED from urgent care to rule out left temporal arteritis Age >50 Follow CRP and sed rate Follow left temporal artery Doppler ultrasound We will treat for now with high-dose steroid until left temporal arthritis/giant cell arteritis is ruled out, IV steroids Solu-Medrol 500 mg daily for 3 days followed by 40 mg daily of oral prednisone. Neurology consulted to assist with the management. EDP discussed the case with ophthalmology, recommended outpatient follow-up regarding blurry vision.  Chronic hypotension on midodrine PTA Resume home midodrine Maintain MAP greater than 65 Closely monitor vital signs.  Type 2  diabetes with hyperglycemia Presented with serum glucose of 588 Last hemoglobin A1c 10.0 on 06/26/2022. Started renal dialysis, carb modified-diet Semglee 10 mg nightly Insulin sliding scale.  HIV Resume home regimen  ESRD on HD MWF Planned hemodialysis on Friday, 08/02/2022 Appreciate nephrology's assistance    DVT prophylaxis: Subcu heparin 3 times daily  Code Status: Full code  Family Communication: None at bedside.  Disposition Plan: Admitted to telemetry medical unit  Consults called: Nephrology, ophthalmology, neurology.  Admission status: Observation status.   Status is: Observation    Kayleen Memos MD [facility] Pager [phone]  If 7PM-7AM, please contact night-coverage [email or web address] Password [facility]  08/01/2022, 11:04 PM

## 2022-08-02 ENCOUNTER — Observation Stay (HOSPITAL_COMMUNITY): Payer: Self-pay

## 2022-08-02 ENCOUNTER — Other Ambulatory Visit: Payer: Self-pay

## 2022-08-02 DIAGNOSIS — H5712 Ocular pain, left eye: Secondary | ICD-10-CM

## 2022-08-02 LAB — PHOSPHORUS
Phosphorus: 3.6 mg/dL (ref 2.5–4.6)
Phosphorus: 3.1 mg/dL (ref 2.5–4.6)

## 2022-08-02 LAB — BASIC METABOLIC PANEL
Anion gap: 16 — ABNORMAL HIGH (ref 5–15)
BUN: 43 mg/dL — ABNORMAL HIGH (ref 6–20)
CO2: 23 mmol/L (ref 22–32)
Calcium: 9.3 mg/dL (ref 8.9–10.3)
Chloride: 92 mmol/L — ABNORMAL LOW (ref 98–111)
Creatinine, Ser: 10.84 mg/dL — ABNORMAL HIGH (ref 0.61–1.24)
GFR, Estimated: 5 mL/min — ABNORMAL LOW (ref >60)
Glucose, Bld: 313 mg/dL — ABNORMAL HIGH (ref 70–99)
Potassium: 5.4 mmol/L — ABNORMAL HIGH (ref 3.5–5.1)
Sodium: 131 mmol/L — ABNORMAL LOW (ref 135–145)

## 2022-08-02 LAB — CBC
HCT: 34.3 % — ABNORMAL LOW (ref 39.0–52.0)
HCT: 36.7 % — ABNORMAL LOW (ref 39.0–52.0)
Hemoglobin: 11.3 g/dL — ABNORMAL LOW (ref 13.0–17.0)
Hemoglobin: 12.2 g/dL — ABNORMAL LOW (ref 13.0–17.0)
MCH: 32.3 pg (ref 26.0–34.0)
MCH: 32.4 pg (ref 26.0–34.0)
MCHC: 32.9 g/dL (ref 30.0–36.0)
MCHC: 33.2 g/dL (ref 30.0–36.0)
MCV: 97.3 fL (ref 80.0–100.0)
MCV: 98 fL (ref 80.0–100.0)
Platelets: 179 10*3/uL (ref 150–400)
Platelets: 213 10*3/uL (ref 150–400)
RBC: 3.5 MIL/uL — ABNORMAL LOW (ref 4.22–5.81)
RBC: 3.77 MIL/uL — ABNORMAL LOW (ref 4.22–5.81)
RDW: 12.7 % (ref 11.5–15.5)
RDW: 12.9 % (ref 11.5–15.5)
WBC: 11 10*3/uL — ABNORMAL HIGH (ref 4.0–10.5)
WBC: 8.7 10*3/uL (ref 4.0–10.5)
nRBC: 0 % (ref 0.0–0.2)
nRBC: 0 % (ref 0.0–0.2)

## 2022-08-02 LAB — COMPREHENSIVE METABOLIC PANEL
ALT: 10 U/L (ref 0–44)
AST: 22 U/L (ref 15–41)
Albumin: 3.6 g/dL (ref 3.5–5.0)
Alkaline Phosphatase: 88 U/L (ref 38–126)
Anion gap: 20 — ABNORMAL HIGH (ref 5–15)
BUN: 36 mg/dL — ABNORMAL HIGH (ref 6–20)
CO2: 24 mmol/L (ref 22–32)
Calcium: 8.9 mg/dL (ref 8.9–10.3)
Chloride: 86 mmol/L — ABNORMAL LOW (ref 98–111)
Creatinine, Ser: 9.92 mg/dL — ABNORMAL HIGH (ref 0.61–1.24)
GFR, Estimated: 6 mL/min — ABNORMAL LOW (ref >60)
Glucose, Bld: 521 mg/dL (ref 70–99)
Potassium: 4.7 mmol/L (ref 3.5–5.1)
Sodium: 130 mmol/L — ABNORMAL LOW (ref 135–145)
Total Bilirubin: 0.9 mg/dL (ref 0.3–1.2)
Total Protein: 8.1 g/dL (ref 6.5–8.1)

## 2022-08-02 LAB — MRSA NEXT GEN BY PCR, NASAL: MRSA by PCR Next Gen: DETECTED — AB

## 2022-08-02 LAB — HEPATITIS B SURFACE ANTIGEN: Hepatitis B Surface Ag: NONREACTIVE

## 2022-08-02 LAB — GLUCOSE, CAPILLARY
Glucose-Capillary: 123 mg/dL — ABNORMAL HIGH (ref 70–99)
Glucose-Capillary: 239 mg/dL — ABNORMAL HIGH (ref 70–99)
Glucose-Capillary: 264 mg/dL — ABNORMAL HIGH (ref 70–99)
Glucose-Capillary: 349 mg/dL — ABNORMAL HIGH (ref 70–99)
Glucose-Capillary: 494 mg/dL — ABNORMAL HIGH (ref 70–99)

## 2022-08-02 LAB — MAGNESIUM
Magnesium: 2.9 mg/dL — ABNORMAL HIGH (ref 1.7–2.4)
Magnesium: 3 mg/dL — ABNORMAL HIGH (ref 1.7–2.4)

## 2022-08-02 LAB — SEDIMENTATION RATE: Sed Rate: 41 mm/hr — ABNORMAL HIGH (ref 0–16)

## 2022-08-02 LAB — C-REACTIVE PROTEIN: CRP: <0.5 mg/dL (ref <1.0)

## 2022-08-02 LAB — BETA-HYDROXYBUTYRIC ACID: Beta-Hydroxybutyric Acid: 0.18 mmol/L (ref 0.05–0.27)

## 2022-08-02 MED ORDER — INSULIN GLARGINE-YFGN 100 UNIT/ML ~~LOC~~ SOLN
10.0000 [IU] | Freq: Every day | SUBCUTANEOUS | Status: DC
  Administered 2022-08-02: 10 [IU] via SUBCUTANEOUS
  Filled 2022-08-02 (×3): qty 0.1

## 2022-08-02 MED ORDER — OXYCODONE HCL 5 MG PO TABS
5.0000 mg | ORAL_TABLET | Freq: Four times a day (QID) | ORAL | Status: DC | PRN
  Administered 2022-08-02 – 2022-08-03 (×3): 5 mg via ORAL
  Filled 2022-08-02 (×3): qty 1

## 2022-08-02 MED ORDER — INSULIN GLARGINE-YFGN 100 UNIT/ML ~~LOC~~ SOLN
16.0000 [IU] | Freq: Every day | SUBCUTANEOUS | Status: DC
  Administered 2022-08-02 – 2022-08-03 (×2): 16 [IU] via SUBCUTANEOUS
  Filled 2022-08-02 (×2): qty 0.16

## 2022-08-02 MED ORDER — INSULIN ASPART 100 UNIT/ML IJ SOLN
5.0000 [IU] | Freq: Three times a day (TID) | INTRAMUSCULAR | Status: DC
  Administered 2022-08-02 – 2022-08-03 (×3): 5 [IU] via SUBCUTANEOUS

## 2022-08-02 MED ORDER — HEPARIN SODIUM (PORCINE) 1000 UNIT/ML IJ SOLN
INTRAMUSCULAR | Status: AC
  Administered 2022-08-02: 2000 [IU] via INTRAVENOUS
  Filled 2022-08-02: qty 2

## 2022-08-02 MED ORDER — ALBUMIN HUMAN 25 % IV SOLN: 25.0000 g | Freq: Once | INTRAVENOUS | Status: AC

## 2022-08-02 MED ORDER — INSULIN ASPART 100 UNIT/ML IJ SOLN
5.0000 [IU] | Freq: Once | INTRAMUSCULAR | Status: AC
  Administered 2022-08-02: 5 [IU] via SUBCUTANEOUS

## 2022-08-02 MED ORDER — INSULIN ASPART 100 UNIT/ML IJ SOLN
0.0000 [IU] | INTRAMUSCULAR | Status: DC
  Administered 2022-08-02: 5 [IU] via SUBCUTANEOUS
  Administered 2022-08-03: 2 [IU] via SUBCUTANEOUS

## 2022-08-02 MED ORDER — ALBUMIN HUMAN 25 % IV SOLN
INTRAVENOUS | Status: AC
  Administered 2022-08-02: 25 g via INTRAVENOUS
  Filled 2022-08-02: qty 100

## 2022-08-02 MED ORDER — METHYLPREDNISOLONE SODIUM SUCC 125 MG IJ SOLR: 60.0000 mg | Freq: Every day | INTRAMUSCULAR | Status: DC

## 2022-08-02 MED ORDER — ACETAMINOPHEN 325 MG PO TABS: 650.0000 mg | ORAL_TABLET | Freq: Four times a day (QID) | ORAL | Status: DC | PRN

## 2022-08-02 MED ORDER — TIMOLOL MALEATE 0.5 % OP SOLN
1.0000 [drp] | Freq: Two times a day (BID) | OPHTHALMIC | Status: DC
  Administered 2022-08-02 – 2022-08-03 (×2): 1 [drp] via OPHTHALMIC
  Filled 2022-08-02 (×2): qty 5

## 2022-08-02 MED ORDER — DORZOLAMIDE HCL 2 % OP SOLN
1.0000 [drp] | Freq: Two times a day (BID) | OPHTHALMIC | Status: DC
  Administered 2022-08-02 – 2022-08-03 (×2): 1 [drp] via OPHTHALMIC
  Filled 2022-08-02: qty 10

## 2022-08-02 MED ORDER — MIDODRINE HCL 5 MG PO TABS
20.0000 mg | ORAL_TABLET | ORAL | Status: AC
  Administered 2022-08-02: 20 mg via ORAL
  Filled 2022-08-02: qty 4

## 2022-08-02 MED ORDER — ERYTHROMYCIN 5 MG/GM OP OINT
TOPICAL_OINTMENT | Freq: Two times a day (BID) | OPHTHALMIC | Status: DC
  Filled 2022-08-02: qty 3.5

## 2022-08-02 MED ORDER — LIVING WELL WITH DIABETES BOOK - IN SPANISH
Freq: Once | Status: AC
  Filled 2022-08-02: qty 1

## 2022-08-02 MED ORDER — PREDNISONE 20 MG PO TABS: 40.0000 mg | ORAL_TABLET | Freq: Every day | ORAL | Status: DC

## 2022-08-02 MED ORDER — BRIMONIDINE TARTRATE 0.2 % OP SOLN
1.0000 [drp] | Freq: Three times a day (TID) | OPHTHALMIC | Status: DC
  Administered 2022-08-02 – 2022-08-03 (×2): 1 [drp] via OPHTHALMIC
  Filled 2022-08-02: qty 5

## 2022-08-02 MED ORDER — PREDNISOLONE ACETATE 1 % OP SUSP
1.0000 [drp] | Freq: Three times a day (TID) | OPHTHALMIC | Status: DC
  Administered 2022-08-02 – 2022-08-03 (×2): 1 [drp] via OPHTHALMIC
  Filled 2022-08-02: qty 5

## 2022-08-02 MED ORDER — SODIUM CHLORIDE 0.9 % IV SOLN
500.0000 mg | Freq: Every day | INTRAVENOUS | Status: DC
  Administered 2022-08-02: 500 mg via INTRAVENOUS
  Filled 2022-08-02: qty 4

## 2022-08-02 MED ORDER — HYDROMORPHONE HCL 1 MG/ML IJ SOLN: 0.5000 mg | INTRAMUSCULAR | Status: DC | PRN

## 2022-08-02 MED ORDER — PANTOPRAZOLE SODIUM 40 MG IV SOLR
40.0000 mg | Freq: Every day | INTRAVENOUS | Status: DC
  Administered 2022-08-02 – 2022-08-03 (×2): 40 mg via INTRAVENOUS
  Filled 2022-08-02 (×2): qty 10

## 2022-08-02 MED ORDER — CHLORHEXIDINE GLUCONATE CLOTH 2 % EX PADS: 6.0000 | MEDICATED_PAD | Freq: Every day | CUTANEOUS | Status: DC

## 2022-08-02 MED ORDER — CALCITRIOL 0.5 MCG PO CAPS
1.0000 ug | ORAL_CAPSULE | ORAL | Status: DC
  Administered 2022-08-02: 1 ug via ORAL
  Filled 2022-08-02: qty 2

## 2022-08-02 NOTE — Consult Note (Signed)
Chief Complaint  Patient presents with   Eye Pain  : Eyepain       Ophthalmology HPI: This is a 52 y.o.  male with an unclear past ocular history that presents with 10 days of left eye pain. He describes sharp stabbing pain as well as left sided headache and eye ache.  No changes in vision in the left eye over the last 10 days. They eyelid is swelling and closing shut.  The left eye is tender to touch.    2 years ago he went to an eye doctor and was told he needed an operation to save the eye. He never followed up.  He lost vision in the left eye 2 years ago and it has not changed since. He presented to the ED because his left eye ached. He was found to be hyperkalemia in setting of ESRD and admiteed to the hospital.   Patient is unclear on ocular history. He states his right eye is fine and has no problems.       Past Ocular History:  Vision loss 2 years ago    Last Eye Exam: 2 years ago    Primary Eye Care: [facility] in [place]. Does not remember where or which doctor he saw.    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[place])    Right   Diabetic neuropathy ([place])    ESRD (end stage renal disease) on dialysis ([place])    "TTS; don't remember street name" (05/03/2014)   Hepatitis C    HIV INFECTION 06/27/2010   Qualifier: Diagnosis of  By: Nickola Major CMA ( AAMA), Jacqueline     Hypotension 0000000   Metabolic bone disease 99991111   MRSA infection    Normocytic anemia 06/17/2012   Pancreatitis    Pressure ulcer of BKA stump, stage 2 ([place]) 11/22/2014   Renal disorder    Renal insufficiency    Severe protein-calorie malnutrition ([place]) 06/17/2012   Uncontrolled diabetes mellitus with complications [id number]    Annotation: uncontrolled Qualifier: Diagnosis of  By: Nickola Major CMA Deborra Medina), Jacqueline     Vaccine counseling 05/29/2022     Past Surgical History:  Procedure Laterality Date   AMPUTATION Left 04/20/2014   Procedure: 3rd toe amputation, 4th Toe Amputation,  5th Toe Amputation;  Surgeon: Newt Minion, MD;  Location: [place];  Service: Orthopedics;  Laterality: Left;   AMPUTATION Left 05/02/2014   Procedure: Midfoot Amputation;  Surgeon: Newt Minion, MD;  Location: [place];  Service: Orthopedics;  Laterality: Left;   AMPUTATION Left 06/17/2014   Procedure: AMPUTATION BELOW KNEE;  Surgeon: Newt Minion, MD;  Location: [place];  Service: Orthopedics;  Laterality: Left;   AMPUTATION Right 09/04/2018   Procedure: RIGHT BELOW KNEE AMPUTATION;  Surgeon: Newt Minion, MD;  Location: [place];  Service: Orthopedics;  Laterality: Right;  RIGHT BELOW KNEE AMPUTATION   AV FISTULA PLACEMENT Left    AV FISTULA PLACEMENT Left 05/10/2016   Procedure: Creation Left Arm Brachiocephalic Arteriovenous Fistula and Ligation of Radiocephalic Fistula;  Surgeon: Angelia Mould, MD;  Location: [place];  Service: Vascular;  Laterality: Left;   FEMUR IM NAIL Left 08/17/2016   Procedure: INTRAMEDULLARY (IM) RETROGRADE FEMORAL NAILING;  Surgeon:  Marybelle Killings, MD;  Location: [place];  Service: Orthopedics;  Laterality: Left;   FOOT AMPUTATION THROUGH ANKLE Left 12/'21/2015   midfoot   IR GENERIC HISTORICAL Left 05/01/2016   IR THROMBECTOMY AV FISTULA W/THROMBOLYSIS/PTA INC/SHUNT/IMG LEFT 05/01/2016 [name], MD [facility]   IR GENERIC HISTORICAL  05/01/2016   IR US GUIDE VASC ACCESS LEFT 05/01/2016 [name], MD [facility]   IR GENERIC HISTORICAL  05/07/2016   IR FLUORO GUIDE CV LINE RIGHT 05/07/2016 Corrie Mckusick, DO [facility]   IR GENERIC HISTORICAL  05/07/2016   IR US GUIDE VASC ACCESS RIGHT 05/07/2016 Corrie Mckusick, DO [facility]   IR GENERIC HISTORICAL  05/22/2016   IR US GUIDE VASC ACCESS RIGHT  05/22/2016 Greggory Keen, MD [facility]   IR GENERIC HISTORICAL  05/22/2016   IR FLUORO GUIDE CV LINE RIGHT 05/22/2016 Greggory Keen, MD [facility]   IR REMOVAL TUN CV CATH W/O [facility]  08/21/2016   PERIPHERAL VASCULAR CATHETERIZATION Left 05/09/2016   Procedure: A/V Fistulagram;  Surgeon: Angelia Mould, MD;  Location: [place] [facility];  Service: Cardiovascular;  Laterality: Left;  arm   TEE WITHOUT CARDIOVERSION N/A 06/22/2018   Procedure: TRANSESOPHAGEAL ECHOCARDIOGRAM (TEE);  Surgeon: Jerline Pain, MD;  Location[facility];  Service: Cardiovascular;  Laterality: N/A;     Social History   Socioeconomic History   Marital status: Single    Spouse name: Not on file   Number of children: Not on file   Years of education: Not on file   Highest education level: Not on file  Occupational History   Not on file  Tobacco Use   Smoking status: Never   Smokeless tobacco: Never  Vaping Use   Vaping Use: Never used  Substance and Sexual Activity   Alcohol use: No   Drug use: No   Sexual activity: Not Currently    Comment: declined condoms  Other Topics Concern   Not on file  Social History Narrative   ** Merged History Encounter **       Social Determinants of Health   Financial Resource Strain: Not on file  Food Insecurity: Not on file  Transportation Needs: Not on file  Physical Activity: Not on file  Stress: Not on file  Social Connections: Not on file  Intimate Partner Violence: Not on file     No Known Allergies   No current facility-administered medications on file prior to encounter.   Current Outpatient Medications on File Prior to Encounter  Medication Sig Dispense Refill   bictegravir-emtricitabine-tenofovir AF (BIKTARVY) 50-200-25 MG TABS tablet Take 1 tablet by mouth daily. 30 tablet 11   insulin NPH-regular Human (HUMULIN 70/30) (70-30) 100 UNIT/ML injection Inject 20 units subcutaneously twice daily with meals 20 mL 5   midodrine (PROAMATINE) 10  MG tablet Take 1 tablet by mouth three times a week as directed, then take 1 tablet 30 minutes prior to HD and 1 tab during treatment (Patient taking differently: Take 10 mg by mouth 3 (three) times a week. On dialysis days) 60 tablet 3   Blood Glucose Monitoring Suppl (TRUE METRIX METER) w/Device KIT Use kit to check glucose 3 (three) times daily. 1 kit 0   glucose blood (TRUE METRIX BLOOD GLUCOSE TEST) test strip use 1 test strip to check glucose 3 (three) times daily. 100 each 2   Insulin Glargine (BASAGLAR KWIKPEN) 100 UNIT/ML Inject 16 Units into the skin daily. (Patient not taking: Reported on 05/29/2022) 6 mL 2  Insulin Syringe-Needle U-100 (BD INSULIN SYRINGE ULTRAFINE) 31G X 15/64" 0.5 ML MISC 1 each by Does not apply route 2 (two) times daily. 100 each 5   midodrine (PROAMATINE) 10 MG tablet Take 1 tablet (10 mg total) by mouth 3 (three) times daily with meals. In Spanish 90 tablet 11   midodrine (PROAMATINE) 10 MG tablet Take 1 tablet by mouth three times a week as directed Take 1 tablet 30 minutes prior to HD and 1 tab during treatment. 60 tablet 3   midodrine (PROAMATINE) 10 MG tablet Take 1 tablet by mouth three times a week as directed Take 1 tablet 30 minutes prior to HD and 1 tab during treatment 60 tablet 5   Pitavastatin Magnesium 4 MG TABS Take 4 mg by mouth daily. (Patient not taking: Reported on 08/01/2022) 30 tablet 11   TRUEplus Lancets 28G MISC Use to test 3 (three) times daily. 100 each 2   [DISCONTINUED] bictegravir-emtricitabine-tenofovir AF (BIKTARVY) 50-200-25 MG TABS tablet Take 1 tablet by mouth daily. 30 tablet 11     ROS    Exam:  General: Awake, Alert, Oriented *3  Vision (near): without correction   OD: 20/60 at near   OS: NLP   Confrontational Field:   Full to count fingers OD   Extraocular Motility:  Full ductions and versions, both eyes   External:   Normal Symmetry, V1-3 intact. Trace edema OS.     Pupils  OD: 28mm to 44mm reactive without  afferent pupillary defect (APD)  OS: 5 fixed, unreactive. +APD by reverse.   IOP(tonopen)  OD: 21  OS: 45   Bedside  Exam:  Lids/Lashes  OD: Normal Lids and lashes, No lesion or injury  OS: Trace lid edema  Conjucntiva/Sclera  OD: White and quiet  OS: 1+ injection  Cornea  OD: Clear without abrasion or defect  OS: Band keratopathy 3.72mm wide, limbus to limbus, no frank corneal edema   Anterior Chamber  OD: Deep and quiet  OS: No Cell, no hyphema   Iris  OD: Normal iris architecture  OS: Rubeosis, fixed dilated.    Lens  OD: 1+ NSC   OS:  Mature, white out cataract  Anterior Vitreous  OD: Clear, without cell  OS: No View    POSTERIOR POLE EXAM (Dialated with phenylephrine and tropicamide.Dilation may last up to 24 hours)  View:   OD: 20/20 view without opacities  OS: No View   Vitreous:   OD: Clear, no cell  OSNo view   Disc:   OD: flat, sharp margin, with appropriate color  OS: No View   C:D Ratio:   OD: 0.2   OS:  No View   Macula  OD: Flat, with appropriate light reflex  OS: No View   Vessels  OD: Normal vasculature  OS: No View   Periphery  OD: DBH, MA  OS: No View      Assessment and Plan:   This is 52 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 CRVO underlying etiology.  - IOP Elevated.   Start Brimondine TID OS, Dorzolamide TID OS, Timolol BID OS - Band Keratopathy  Start Erythromycin ophthalmic ointment BID for comfort - Blind Painful eye  Start Prednisone TID OS.   Mild Nonproliferative diabetic retinopathy  OD - Not visually signficant. BP control, glucose control, no smoking   Follow up in 1 week sooner as outpatient.     Julian Reil, M.D.  [facility] [address] [place], [place] [address] [phone]

## 2022-08-02 NOTE — Progress Notes (Addendum)
PROGRESS NOTE Alvin Daniels  [id number] DOB: 16-Aug-1970 DOA: 08/01/2022 PCP: Dorna Mai, MD  Brief Narrative/Hospital Course: 52 y.o.m w/  T2DM,HIV,ESRD on HD MWF, last hemodialysis was on Wednesday,presented to [facility] ED sent from urgent care to rule out left temporal arteritis due to left eye pain, left temporal artery pain, and blurry vision.  Reportedly, the patient has been seen by ophthalmology for the same complaint outpatient.   EDP discussed the case with ophthalmology, Dr. Leda Roys ophthalmology the patient does not need to be seen acutely, can follow-up as an outpatient with them.   EDP also discussed the case with nephrology due to severe hyperkalemia with a potassium of 6.5 and EKG abnormality.  No need for emergent hemodialysis overnight per nephrology, okay to treat medically.  His hyperkalemia was treated with Lokelma, IV insulin, IV calcium gluconate, IV D50, and albuterol. And admission was requested for further management  IN ed: Tmax 98.9.  BP 100/45, pulse 109, respiratory 18, saturation 97% on room air.  Lab studies remarkable for hemoglobin 11.3.  Serum potassium 6.5, serum glucose 588.   Subjective: Seen and examined, Overnight vitals/labs/events reviewed  C/o pain on left temporal area and difficulty vision Overnight blood sugar running high Used interpreter Sees patient states  HE SEES "little bit not too much" with left eye for about 2 years but it is severe since  last Friday due to pain-so came to ED. Vision has been poor in left eye able to see shadow and able to see fingers when held infront of left eye but cannot count.     Assessment and Plan: Principal Problem:   Hyperkalemia   Severe hyperkalemia status post temporizing measures with improvement , going for HD this morning  ESRD and HD MWF nephro on board going for HD today Anemia of chronic renal disease hemoglobin at 11 g.  Monitor  Left eye pain Left eye visual deficit : ?left temporal  artery pain, blurry vision, concern for possible left temporal arteritis/giant cell arteritis: Patient was sent to ED from urgent care to rule out left temporal arteritis Sent to the ED from urgent care to rule out left temporal arteritis.  Patient reports he has been having left eye pain and decreased vision for last 2 years but worse with pain and decreasing vision since last Friday. He was recently referred to Ophthalmo by his PCP Dr Barnetta Hammersmith 07/17/22. ED had discussed with the ophthalmology, on exam left eye pupil is dilated at very diminished vision barely able to see hand in front/able to perceive light, tender left temporal area,?Corneal deposit.  I called Dr [name] office w/ ophthalmology to evaluate him- eft msg to his office with call back no provided. CRP neg, esr ordered. Left temporal artery Doppler ultrasound  as belwo. patient has been started on empiric high-dose Solu-Medrol:In ED Neurology as consulted. Consulted Vascular for Temp art biopsy. "Calcification noted of the bilateral temporal arteries, left > right. Wall thickness measures 0.7 mm at the distal left temporal artery and 0.5 mm at the distal right temporal artery. The left temporal artery exhibits poor  compressibility in the setting  of calcification "  ADDENDUM AT 2:15PMOphthalmo Office did not call me back- SO I reached out to Dr [name] again, this time on his cell phone- consulted him and made him aware of his left eye-he will be coming to see him  Chronic hypotension on midodrine Type 2 diabetes mellitus with uncontrolled hyperglycemia: Blood sugar running in the 500, now down to 340s, on  SSI 0 to 15 units, add long-acting insulin 16 u, 5 u premeal and cont ssi.Hba1c ordered. He does have elevated anion gap but with ESRD not unexpected-and bicarb is normal- I will check BHOB to rule out DKA- cbg coming donwn in mid 200s. 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 Patient status is:  admitted as observation but remains hospitalized for ongoing  because of left temporal area pain left eye decreased vision Level of care: Telemetry Medical   Dispo: The patient is from: home            Anticipated disposition: TBD Objective: Vitals last 24 hrs: Vitals:   08/02/22 0028 08/02/22 0257 08/02/22 0622 08/02/22 0848  BP: (!) 100/45 (!) 102/36 (!) 144/65 (!) 146/64  Pulse: (!) 109 96 94 98  Resp: 18  18   Temp: 98.9 F (37.2 C) 98 F (36.7 C) 98.2 F (36.8 C) 98.2 F (36.8 C)  TempSrc: Oral Oral Oral Oral  SpO2: 97%  97% 98%  Weight:      Height:       Weight change:   Physical Examination: General exam: alert awake, pleasant older than stated age HEENT:Oral mucosa moist, Ear/Nose WNL grossly Respiratory system: bilaterally clear BS, no use of accessory muscle Cardiovascular system: S1 & S2 +, No JVD. Gastrointestinal system: Abdomen soft,NT,ND, BS+ Nervous System:Alert, awake, moving extremities.  Able to perceive light in the left eye, left eye is shut and and needed to be passively opened. Extremities: b/l BKA OLD,distal peripheral pulses palpable.  Skin: No rashes,no icterus. MSK: Normal muscle bulk,tone, power  Medications reviewed:  Scheduled Meds:  bictegravir-emtricitabine-tenofovir AF  1 tablet Oral Daily   Chlorhexidine Gluconate Cloth  6 each Topical Q0600   heparin  5,000 Units Subcutaneous Q8H   insulin aspart  0-15 Units Subcutaneous TID WC   insulin aspart  0-5 Units Subcutaneous QHS   insulin glargine-yfgn  10 Units Subcutaneous QHS   lanthanum  1,000 mg Oral TID    midodrine  10 mg Oral Once per day on Mon Wed Fri   pantoprazole (PROTONIX) IV  40 mg Intravenous Daily   pravastatin  80 mg Oral q1800   [START ON 08/05/2022] predniSONE  40 mg Oral Q breakfast  Continuous Infusions:  methylPREDNISolone (SOLU-MEDROL) injection Stopped (08/02/22 0500)    Diet Order             Diet renal/carb modified with fluid restriction Diet-HS Snack? Nothing; Fluid restriction: 1200 mL Fluid; Room service appropriate? Yes; Fluid consistency: Thin  Diet effective now                   Intake/Output Summary (Last 24 hours) at 08/02/2022 1012 Last data filed at 08/02/2022 0500 Gross per 24 hour  Intake 538 ml  Output --  Net 538 ml   Net IO Since Admission: 538 mL [08/02/22 1012]  Wt Readings from Last 3 Encounters:  08/01/22 49.9 kg  06/26/22 49.9 kg  05/29/22 49.9 kg     Unresulted Labs (From admission, onward)  Start     Ordered   08/02/22 0833  Hepatitis B surface antigen  (New Admission Hemo Labs (Hepatitis B))  Once,   R        08/02/22 0833   08/02/22 0833  Hepatitis B surface antibody,quantitative  (New Admission Hemo Labs (Hepatitis B))  Once,   R        08/02/22 [id number]   08/02/22 [id number]  Basic metabolic panel  Once,   STAT        08/02/22 0830          Data Reviewed: I have personally reviewed following labs and imaging studies CBC: Recent Labs  Lab 08/01/22 1807 08/01/22 2353  WBC 7.9 8.7  NEUTROABS 5.5  --   HGB 12.6* 11.3*  HCT 36.9* 34.3*  MCV 96.3 98.0  PLT 193 0000000   Basic Metabolic Panel: Recent Labs  Lab 08/01/22 1807 08/01/22 2353  NA 127* 130*  K 6.5* 4.7  CL 88* 86*  CO2 26 24  GLUCOSE 588* 521*  BUN 35* 36*  CREATININE 9.05* 9.92*  CALCIUM 8.9 8.9  MG  --  2.9*  PHOS  --  3.6  GFR: Estimated Creatinine Clearance: 6.2 mL/min (A) (by C-G formula based on SCr of 9.92 mg/dL (H)). Liver Function Tests: Recent Labs  Lab 08/01/22 2353  AST 22  ALT 10  ALKPHOS 88  BILITOT 0.9  PROT 8.1  ALBUMIN 3.6    Recent Labs  Lab 08/01/22 2105 08/02/22 0030 08/02/22 0750  GLUCAP 512* 494* 349*  No results found for this or any previous visit (from the past 240 hour(s)).  Antimicrobials: Anti-infectives (From admission, onward)    Start     Dose/Rate Route Frequency Ordered Stop   08/02/22 1000  bictegravir-emtricitabine-tenofovir AF (BIKTARVY) 50-200-25 MG per tablet 1 tablet        1 tablet Oral Daily 08/01/22 2305       Culture/Microbiology    Component Value Date/Time   SDES  12/09/2020 0055    BLOOD Performed at [facility] [place] [address], [place], [place] [address]    SPECREQUEST  12/09/2020 0055    BOTTLES DRAWN AEROBIC AND ANAEROBIC Blood Culture results may not be optimal due to an inadequate volume of blood received in culture bottles Performed at [facility], [place] [address] [place], [place] [address]    CULT  12/09/2020 0055    NO GROWTH 5 DAYS Performed at [place] [address] [place], [place] [address]    REPTSTATUS 12/14/2020 FINAL 12/09/2020 0055  Radiology Studies: DG Chest Portable 1 View  Result Date: 08/01/2022 CLINICAL DATA:  End-stage renal disease EXAM: PORTABLE CHEST 1 VIEW COMPARISON:  12/08/2020 FINDINGS: The heart size and mediastinal contours are within normal limits. Both lungs are clear. minimal scarring or atelectasis left base. The visualized skeletal structures are unremarkable. IMPRESSION: No active disease. Minimal scarring or atelectasis at the left base. Electronically Signed   By: Donavan Foil M.D.   On: 08/01/2022 21:13     LOS: 0 days   Antonieta Pert, MD [facility]  08/02/2022, 10:12 AM

## 2022-08-02 NOTE — Progress Notes (Signed)
TRH night cross cover note:   I was notified by RN that patient's CBG resolved upon arriving on the floor is found to be 521.  Per chart review, blood sugar in the ED was as high as 588, with CMP showed no evidence of anion gap metabolic acidosis.  As this is an end-stage renal disease patient on hemodialysis, will be somewhat conservative with additional insulin to prevent ensuing development of hypoglycemia.  I subsequent placed order for an additional 5 units of subcu NovoLog along with a repeat CBG check to occur approximately an hour after he gets this dose of novolog.    Babs Bertin, DO Hospitalist

## 2022-08-02 NOTE — Plan of Care (Signed)
Consent for hemodialysis obtained with interpreter services. [employer] [phone]

## 2022-08-02 NOTE — Procedures (Signed)
   I was present at this dialysis session, have reviewed the session itself and made  appropriate changes Kelly Splinter MD [place] pager [phone]   08/02/2022, 3:42 PM

## 2022-08-02 NOTE — Procedures (Signed)
HD Note: Hepatitis results from [facility] records Some information was entered later than the data was gathered due to patient care needs. The stated time with the data is accurate..  Received patient in bed to unit.  Alert and oriented.  Informed consent signed and in chart.   Ammended order of 1K bath be used for the first 45 minutes and 2K bath be used for for the rest of the treatment.  The patient's BP values fluctuated and the UF had to be turned off frequently.  Albumin given x2, see MAR  TX duration: 3.5  Patient tolerated well.  Transported back to the room  Alert, without acute distress.  Hand-off given to patient's nurse.   Access used: Upper left arm Access issues: no issues  Total UF removed: 1500 ml    [facility]

## 2022-08-02 NOTE — Consult Note (Addendum)
Hospital Consult    Reason for Consult:  L eye pain concern for temporal arteritis Requesting Physician:  [facility] MRN #:  [id number]  History of Present Illness: This is a 52 y.o. male with past medical history significant for type 2 diabetes mellitus, end-stage renal disease on hemodialysis, history of bilateral below the knee amputations, who reported to [facility] ED with L eye swelling and pain.  A medical translator was used during today's visit.  He reports L eye pain starting 2 weeks ago.  Eye is now swollen shut and he also has crusty drainage.  He is also having blurry vision.  He is being seen in consultation for evaluation for possible temporal arteritis.  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   Diabetic neuropathy ([place])    ESRD (end stage renal disease) on dialysis ([place])    "TTS; don't remember street name" (05/03/2014)   Hepatitis C    HIV INFECTION 06/27/2010   Qualifier: Diagnosis of  By: Nickola Major CMA ( AAMA), Jacqueline     Hypotension 0000000   Metabolic bone disease [id number]   MRSA infection    Normocytic anemia 06/17/2012   Pancreatitis    Pressure ulcer of BKA stump, stage 2 ([place]) 11/22/2014   Renal disorder    Renal insufficiency    Severe protein-calorie malnutrition ([place]) 06/17/2012   Uncontrolled diabetes mellitus with complications [id number]   Annotation: uncontrolled Qualifier: Diagnosis of  By: Nickola Major CMA Deborra Medina), Jacqueline     Vaccine counseling 05/29/2022    Past Surgical History:  Procedure Laterality Date   AMPUTATION Left 04/20/2014   Procedure: 3rd toe amputation, 4th Toe Amputation,  5th Toe Amputation;  Surgeon: Newt Minion, MD;  Location: [place];  Service: Orthopedics;  Laterality:  Left;   AMPUTATION Left 05/02/2014   Procedure: Midfoot Amputation;  Surgeon: Newt Minion, MD;  Location: [place];  Service: Orthopedics;  Laterality: Left;   AMPUTATION Left 06/17/2014   Procedure: AMPUTATION BELOW KNEE;  Surgeon: Newt Minion, MD;  Location: [place];  Service: Orthopedics;  Laterality: Left;   AMPUTATION Right 09/04/2018   Procedure: RIGHT BELOW KNEE AMPUTATION;  Surgeon: Newt Minion, MD;  Location: [place];  Service: Orthopedics;  Laterality: Right;  RIGHT BELOW KNEE AMPUTATION   AV FISTULA PLACEMENT Left    AV FISTULA PLACEMENT Left 05/10/2016   Procedure: Creation Left Arm Brachiocephalic Arteriovenous Fistula and Ligation of Radiocephalic Fistula;  Surgeon: Angelia Mould, MD;  Location: [place];  Service: Vascular;  Laterality: Left;   FEMUR IM NAIL Left 08/17/2016   Procedure: INTRAMEDULLARY (IM) RETROGRADE FEMORAL NAILING;  Surgeon: Marybelle Killings, MD;  Location: [place];  Service: Orthopedics;  Laterality: Left;   FOOT AMPUTATION THROUGH ANKLE Left 12/'21/2015   midfoot   IR GENERIC HISTORICAL Left 05/01/2016   IR THROMBECTOMY AV FISTULA W/THROMBOLYSIS/PTA INC/SHUNT/IMG LEFT 05/01/2016 [name], MD [facility]   IR GENERIC HISTORICAL  05/01/2016   IR US GUIDE VASC ACCESS LEFT 05/01/2016 [name], MD [facility]   IR GENERIC HISTORICAL  05/07/2016   IR FLUORO GUIDE CV LINE RIGHT 05/07/2016 Corrie Mckusick, DO [facility]   IR GENERIC HISTORICAL  05/07/2016   IR US GUIDE  VASC ACCESS RIGHT 05/07/2016 Corrie Mckusick, DO [facility]   IR GENERIC HISTORICAL  05/22/2016   IR US GUIDE VASC ACCESS RIGHT 05/22/2016 Greggory Keen, MD [facility]   IR GENERIC HISTORICAL  05/22/2016   IR FLUORO GUIDE CV LINE RIGHT 05/22/2016 Greggory Keen, MD [facility]   IR REMOVAL TUN CV CATH W/O [facility]  08/21/2016   PERIPHERAL VASCULAR CATHETERIZATION Left 05/09/2016   Procedure: A/V Fistulagram;  Surgeon: Angelia Mould, MD;  Location: [place] CV [facility];  Service:  Cardiovascular;  Laterality: Left;  arm   TEE WITHOUT CARDIOVERSION N/A 06/22/2018   Procedure: TRANSESOPHAGEAL ECHOCARDIOGRAM (TEE);  Surgeon: Jerline Pain, MD;  Location: [facility] ENDOSCOPY;  Service: Cardiovascular;  Laterality: N/A;    No Known Allergies  Prior to Admission medications   Medication Sig Start Date End Date Taking? Authorizing Provider  bictegravir-emtricitabine-tenofovir AF (BIKTARVY) 50-200-25 MG TABS tablet Take 1 tablet by mouth daily. 05/29/22  Yes Tommy Medal, Lavell Islam, MD  insulin NPH-regular Human (HUMULIN 70/30) (70-30) 100 UNIT/ML injection Inject 20 units subcutaneously twice daily with meals 06/26/22  Yes Dorna Mai, MD  midodrine (PROAMATINE) 10 MG tablet Take 1 tablet by mouth three times a week as directed, then take 1 tablet 30 minutes prior to HD and 1 tab during treatment Patient taking differently: Take 10 mg by mouth 3 (three) times a week. On dialysis days 03/27/22  Yes   Blood Glucose Monitoring Suppl (TRUE METRIX METER) w/Device KIT Use kit to check glucose 3 (three) times daily. 12/27/21   [name], MD  glucose blood (TRUE METRIX BLOOD GLUCOSE TEST) test strip use 1 test strip to check glucose 3 (three) times daily. 12/27/21   [name], MD  Insulin Glargine (BASAGLAR KWIKPEN) 100 UNIT/ML Inject 16 Units into the skin daily. Patient not taking: Reported on 05/29/2022 12/27/21   [name], MD  Insulin Syringe-Needle U-100 (BD INSULIN SYRINGE ULTRAFINE) 31G X 15/64" 0.5 ML MISC 1 each by Does not apply route 2 (two) times daily. 02/10/20   [name], MD  midodrine (PROAMATINE) 10 MG tablet Take 1 tablet (10 mg total) by mouth 3 (three) times daily with meals. In Spanish 01/02/22   Tommy Medal, Lavell Islam, MD  midodrine (PROAMATINE) 10 MG tablet Take 1 tablet by mouth three times a week as directed Take 1 tablet 30 minutes prior to HD and 1 tab during treatment. 02/25/22     midodrine (PROAMATINE) 10 MG tablet Take 1 tablet by mouth three times  a week as directed Take 1 tablet 30 minutes prior to HD and 1 tab during treatment 03/27/22   Dorna Mai, MD  Pitavastatin Magnesium 4 MG TABS Take 4 mg by mouth daily. Patient not taking: Reported on 08/01/2022 05/29/22   Tommy Medal, Lavell Islam, MD  TRUEplus Lancets 28G MISC Use to test 3 (three) times daily. 12/27/21   [name], MD  bictegravir-emtricitabine-tenofovir AF (BIKTARVY) 50-200-25 MG TABS tablet Take 1 tablet by mouth daily. 01/02/22   Truman Hayward, MD    Social History   Socioeconomic History   Marital status: Single    Spouse name: Not on file   Number of children: Not on file   Years of education: Not on file   Highest education level: Not on file  Occupational History   Not on file  Tobacco Use   Smoking status: Never   Smokeless tobacco: Never  Vaping Use   Vaping Use: Never used  Substance and Sexual Activity  Alcohol use: No   Drug use: No   Sexual activity: Not Currently    Comment: declined condoms  Other Topics Concern   Not on file  Social History Narrative   ** Merged History Encounter **       Social Determinants of Health   Financial Resource Strain: Not on file  Food Insecurity: Not on file  Transportation Needs: Not on file  Physical Activity: Not on file  Stress: Not on file  Social Connections: Not on file  Intimate Partner Violence: Not on file     Family History  Problem Relation Age of Onset   Diabetes Mother    Diabetes Father     ROS: Otherwise negative unless mentioned in HPI  Physical Examination  Vitals:   08/02/22 0848 08/02/22 1322  BP: (!) 146/64 (!) 168/92  Pulse: 98 92  Resp:  15  Temp: 98.2 F (36.8 C)   SpO2: 98% 98%   Body mass index is 20.12 kg/m.  General:  WDWN in NAD Gait: Not observed HENT: WNL, normocephalic Pulmonary: normal non-labored breathing Cardiac: regular Abdomen: soft, NT/ND, no masses Skin: without rashes Vascular Exam/Pulses: B BKA well healed Extremities: L AVF  with palpable thrill; aneurysmal degeneration but no overlying ulcers Musculoskeletal: no muscle wasting or atrophy  Neurologic: A&O X 3 Psychiatric:  The pt has Normal affect. Lymph:  Unremarkable  CBC    Component Value Date/Time   WBC 8.7 08/01/2022 2353   RBC 3.50 (L) 08/01/2022 2353   HGB 11.3 (L) 08/01/2022 2353   HCT 34.3 (L) 08/01/2022 2353   PLT 179 08/01/2022 2353   MCV 98.0 08/01/2022 2353   MCH 32.3 08/01/2022 2353   MCHC 32.9 08/01/2022 2353   RDW 12.7 08/01/2022 2353   LYMPHSABS 1.3 08/01/2022 1807   MONOABS 0.5 08/01/2022 1807   EOSABS 0.5 08/01/2022 1807   BASOSABS 0.1 08/01/2022 1807    BMET    Component Value Date/Time   NA 131 (L) 08/02/2022 0952   K 5.4 (H) 08/02/2022 0952   CL 92 (L) 08/02/2022 0952   CO2 23 08/02/2022 0952   GLUCOSE 313 (H) 08/02/2022 0952   BUN 43 (H) 08/02/2022 0952   CREATININE 10.84 (H) 08/02/2022 0952   CREATININE 4.88 (H) 06/06/2021 1452   CALCIUM 9.3 08/02/2022 0952   CALCIUM 8.5 03/12/2010 1250   GFRNONAA 5 (L) 08/02/2022 0952   GFRNONAA 10 (L) 05/15/2017 1531   GFRAA 6 (L) 12/10/2019 1502   GFRAA 11 (L) 05/15/2017 1531    COAGS: Lab Results  Component Value Date   INR 1.2 02/28/2020   INR 1.1 09/27/2019   INR 1.27 08/16/2016      ASSESSMENT/PLAN: This is a 52 y.o. male with L eye pain, swelling, and thick drainage with blurry vision  - 2 weeks of L eye pain.  Eye is now swollen shut with thick drainage - Vascular has been asked to perform temporal artery biopsy to rule out giant cell arteritis;  She is currently on steroids.  Based on physical exam, differential should include infectious etiology.  Less likely giant cell arteritis given symptom profile.  On call vascular surgeon Dr Virl Cagey will evaluate the patient later today and will discuss utility of temporal artery biopsy.     Dagoberto Ligas PA-C [facility] [phone]   VASCULAR STAFF ADDENDUM: I have independently interviewed  and examined the patient. I agree with the above.  Pts presentation and symptoms are inconsistent with temporal arteritis. I do  not think biopsy would provide significant benefit.  Appreciate ophthalmology consultation as the pain is described as being in the eye, radiating to the temple with swelling and drainage.   Cassandria Santee, MD [facility] Phone Number: [phone] 08/02/2022 11:35 PM

## 2022-08-02 NOTE — Progress Notes (Signed)
Spanish remote interpretor used by the name Minna Merritts [phone].

## 2022-08-02 NOTE — Consult Note (Signed)
Renal Service Consult Note [facility]  Alvin Daniels 08/02/2022 Sol Blazing, MD Requesting Physician: Dr Lupita Leash  Reason for Consult: ESRD pt w/ L eye pains HPI: The patient is a 52 y.o. year-old w/ PMH as below who presented to ED w/ L eye pain and L sided headache for about 3 days. Also blurry vision L eye. Sent from UC to ED to r/o temporal arteritis. In ED VVS and BP 100/45, 97% sats on RA. Hb 11.3, K+ 6.5. Pt was given temporizing measures for high K+ and this am K+ is 4.7. Pt was admitted and we are asked to see for dialysis.   Pt seen in room. C/o L eye pain, asking for pain medication. No HD issues. No SOB, cough or CP, no abd pain.    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[place])    Right   Diabetic neuropathy ([place])    ESRD (end stage renal disease) on dialysis ([place])    "TTS; don't remember street name" (05/03/2014)   Hepatitis C    HIV INFECTION 06/27/2010   Qualifier: Diagnosis of  By: Nickola Major CMA ( AAMA), Jacqueline     Hypotension 0000000   Metabolic bone disease [id number]   MRSA infection    Normocytic anemia 06/17/2012   Pancreatitis    Pressure ulcer of BKA stump, stage 2 ([place]) 11/22/2014   Renal disorder    Renal insufficiency    Severe protein-calorie malnutrition ([place]) 06/17/2012   Uncontrolled diabetes mellitus with complications [id number]   Annotation: uncontrolled Qualifier: Diagnosis of  By: Nickola Major CMA Deborra Medina), Jacqueline     Vaccine counseling 05/29/2022   Past Surgical History  Past Surgical History:  Procedure  Laterality Date   AMPUTATION Left 04/20/2014   Procedure: 3rd toe amputation, 4th Toe Amputation,  5th Toe Amputation;  Surgeon: Newt Minion, MD;  Location: [place];  Service: Orthopedics;  Laterality: Left;   AMPUTATION Left 05/02/2014   Procedure: Midfoot Amputation;  Surgeon: Newt Minion, MD;  Location: [place];  Service: Orthopedics;  Laterality: Left;   AMPUTATION Left 06/17/2014   Procedure: AMPUTATION BELOW KNEE;  Surgeon: Newt Minion, MD;  Location: [place];  Service: Orthopedics;  Laterality: Left;   AMPUTATION Right 09/04/2018   Procedure: RIGHT BELOW KNEE AMPUTATION;  Surgeon: Newt Minion, MD;  Location: [place];  Service: Orthopedics;  Laterality: Right;  RIGHT BELOW KNEE AMPUTATION   AV FISTULA PLACEMENT Left    AV FISTULA PLACEMENT Left 05/10/2016   Procedure: Creation Left Arm Brachiocephalic Arteriovenous Fistula and Ligation of Radiocephalic Fistula;  Surgeon: Angelia Mould, MD;  Location: [place];  Service: Vascular;  Laterality: Left;   FEMUR IM NAIL Left 08/17/2016   Procedure: INTRAMEDULLARY (IM) RETROGRADE FEMORAL NAILING;  Surgeon: Marybelle Killings, MD;  Location: [place];  Service: Orthopedics;  Laterality: Left;   FOOT AMPUTATION THROUGH ANKLE Left 12/'21/2015   midfoot   IR GENERIC HISTORICAL Left 05/01/2016   IR THROMBECTOMY AV FISTULA W/THROMBOLYSIS/PTA INC/SHUNT/IMG LEFT 05/01/2016 [name], MD [facility]   IR  GENERIC HISTORICAL  05/01/2016   IR US GUIDE VASC ACCESS LEFT 05/01/2016 [name], MD [facility]   IR GENERIC HISTORICAL  05/07/2016   IR FLUORO GUIDE CV LINE RIGHT 05/07/2016 Corrie Mckusick, DO [facility]   IR GENERIC HISTORICAL  05/07/2016   IR US GUIDE VASC ACCESS RIGHT 05/07/2016 Corrie Mckusick, DO [facility]   IR GENERIC HISTORICAL  05/22/2016   IR US GUIDE VASC ACCESS RIGHT 05/22/2016 Greggory Keen, MD [facility]   IR GENERIC HISTORICAL  05/22/2016   IR FLUORO GUIDE CV LINE RIGHT 05/22/2016 Greggory Keen, MD [facility]   IR REMOVAL  TUN CV CATH W/O [facility]  08/21/2016   PERIPHERAL VASCULAR CATHETERIZATION Left 05/09/2016   Procedure: A/V Fistulagram;  Surgeon: Angelia Mould, MD;  Location: [place] CV [facility];  Service: Cardiovascular;  Laterality: Left;  arm   TEE WITHOUT CARDIOVERSION N/A 06/22/2018   Procedure: TRANSESOPHAGEAL ECHOCARDIOGRAM (TEE);  Surgeon: Jerline Pain, MD;  Location[facility] END[facility];  Service: Cardiovascular;  Laterality: N/A;   Family History  Family History  Problem Relation Age of Onset   Diabetes Mother    Diabetes Father    Social History  reports that he has never smoked. He has never used smokeless tobacco. He reports that he does not drink alcohol and does not use drugs. Allergies No Known Allergies Home medications Prior to Admission medications   Medication Sig Start Date End Date Taking? Authorizing Provider  bictegravir-emtricitabine-tenofovir AF (BIKTARVY) 50-200-25 MG TABS tablet Take 1 tablet by mouth daily. 05/29/22  Yes Tommy Medal, Lavell Islam, MD  insulin NPH-regular Human (HUMULIN 70/30) (70-30) 100 UNIT/ML injection Inject 20 units subcutaneously twice daily with meals 06/26/22  Yes Dorna Mai, MD  midodrine (PROAMATINE) 10 MG tablet Take 1 tablet by mouth three times a week as directed, then take 1 tablet 30 minutes prior to HD and 1 tab during treatment Patient taking differently: Take 10 mg by mouth 3 (three) times a week. On dialysis days 03/27/22  Yes   Blood Glucose Monitoring Suppl (TRUE METRIX METER) w/Device KIT Use kit to check glucose 3 (three) times daily. 12/27/21   [name], MD  glucose blood (TRUE METRIX BLOOD GLUCOSE TEST) test strip use 1 test strip to check glucose 3 (three) times daily. 12/27/21   [name], MD  Insulin Glargine (BASAGLAR KWIKPEN) 100 UNIT/ML Inject 16 Units into the skin daily. Patient not taking: Reported on 05/29/2022 12/27/21   [name], MD  Insulin Syringe-Needle U-100 (BD INSULIN SYRINGE ULTRAFINE) 31G X 15/64" 0.5 ML MISC  1 each by Does not apply route 2 (two) times daily. 02/10/20   [name], MD  midodrine (PROAMATINE) 10 MG tablet Take 1 tablet (10 mg total) by mouth 3 (three) times daily with meals. In Spanish 01/02/22   Tommy Medal, Lavell Islam, MD  midodrine (PROAMATINE) 10 MG tablet Take 1 tablet by mouth three times a week as directed Take 1 tablet 30 minutes prior to HD and 1 tab during treatment. 02/25/22     midodrine (PROAMATINE) 10 MG tablet Take 1 tablet by mouth three times a week as directed Take 1 tablet 30 minutes prior to HD and 1 tab during treatment 03/27/22   Dorna Mai, MD  Pitavastatin Magnesium 4 MG TABS Take 4 mg by mouth daily. Patient not taking: Reported on 08/01/2022 05/29/22   Tommy Medal, Lavell Islam, MD  TRUEplus Lancets 28G MISC Use to test 3 (three) times daily. 12/27/21   [name], MD  bictegravir-emtricitabine-tenofovir AF (BIKTARVY) 50-200-25 MG TABS tablet Take 1 tablet by mouth daily. 01/02/22   Truman Hayward, MD     Vitals:   08/01/22 2240 08/02/22 0028 08/02/22 0257 08/02/22 0622  BP: 98/78 (!) 100/45 (!) 102/36 (!) 144/65  Pulse: 83 (!) 109 96 94  Resp: (!) 22 18  18   Temp:  98.9 F (37.2 C) 98 F (36.7 C) 98.2 F (36.8 C)  TempSrc:  Oral Oral Oral  SpO2: 100% 97%  97%  Weight:      Height:       Exam Gen alert, no distress  L eye closed, no erythema or drainage noted No rash, cyanosis or gangrene Sclera anicteric, throat clear  No jvd or bruits Chest clear bilat to bases, no rales/ wheezing RRR no MRG Abd soft ntnd no mass or ascites +bs GU defer MS no joint effusions, bilat LE amputee Ext no LE edema, no wounds or ulcers Neuro is alert, Ox 3 , nf    LUA AVF+Bruit      Home meds include - insulin glargine/ 70-30, midodrine 10mg  tiw pre hd and/or 10mg  tid, biktarvy, prns/ vits/ supps     OP HD: MWF NW 4h   400/1.5 46.5kg   2/2 bath   AVF  Heparin 2000  - last HD 3/20, post wt 50.8kg   - rocaltrol 1.0 mcg po tiw - no esa, last Hb  12.6    CXR - IMPRESSION: No active disease. Minimal scarring or atelectasis at the left base.    Na 131 K+ 5.4   CO2 23 BUN 43  creat 9.05    Assessment/ Plan: L eye pain - w/u in progress, per pmd Hyperkalemia - temporized in the ED last night. To get HD today.  ESRD - on HD MWF. HD today.  BP/ volume - BP's low-normal, not on HTN meds at home. Volume okay on exam, no excess.  Anemia esrd - Hb 11-12 here, not on esa at OP unit. Follow.  MBD ckd - CCa and phos are in range. Not sure if taking a binder.  HIV - per pmd      Kelly Splinter  MD [facility] 08/02/2022, 8:26 AM  Recent Labs  Lab 08/01/22 1807 08/01/22 2353  HGB 12.6* 11.3*  ALBUMIN  --  3.6  CALCIUM 8.9 8.9  PHOS  --  3.6  CREATININE 9.05* 9.92*  K 6.5* 4.7   Inpatient medications:  bictegravir-emtricitabine-tenofovir AF  1 tablet Oral Daily   heparin  5,000 Units Subcutaneous Q8H   insulin aspart  0-15 Units Subcutaneous TID WC   insulin aspart  0-5 Units Subcutaneous QHS   insulin glargine-yfgn  10 Units Subcutaneous QHS   lanthanum  1,000 mg Oral TID   midodrine  10 mg Oral Once per day on Mon Wed Fri   pantoprazole (PROTONIX) IV  40 mg Intravenous Daily   pravastatin  80 mg Oral q1800   [START ON 08/05/2022] predniSONE  40 mg Oral Q breakfast    methylPREDNISolone (SOLU-MEDROL) injection Stopped (08/02/22 0500)   acetaminophen, HYDROmorphone (DILAUDID) injection, oxyCODONE

## 2022-08-02 NOTE — Hospital Course (Addendum)
52 y.o.m w/  T2DM,HIV,ESRD on HD MWF, last hemodialysis was on Wednesday,presented to [facility] ED sent from urgent care to rule out left temporal arteritis due to left eye pain, left temporal artery pain, and blurry vision.  Reportedly, the patient has been seen by ophthalmology for the same complaint outpatient.   EDP discussed the case with ophthalmology, Dr. Leda Roys ophthalmology the patient does not need to be seen acutely, can follow-up as an outpatient with them.   EDP also discussed the case with nephrology due to severe hyperkalemia with a potassium of 6.5 and EKG abnormality.  No need for emergent hemodialysis overnight per nephrology, okay to treat medically.  His hyperkalemia was treated with Lokelma, IV insulin, IV calcium gluconate, IV D50, and albuterol. And admission was requested for further management  IN ed: Tmax 98.9.  BP 100/45, pulse 109, respiratory 18, saturation 97% on room air.  Lab studies remarkable for hemoglobin 11.3.  Serum potassium 6.5, serum glucose 588. Patient was subsequently admitted was seen by vascular surgery-and ophthalmology felt that patient's eye pain was due to glaucoma low suspicion for giant cell arteritis, he already lost the vision in the left eye 2 years ago and it has not changed since.  He has has a Pred Forte on board CRP negative, patient was started on glaucoma regimen with different drops following which his eye pain has resolved.  He would like to be discharged today-and he will feel better at home does not want to go  further workup and he will follow-up with his ophthalmology Dr Alvin Daniels as OP

## 2022-08-02 NOTE — Progress Notes (Signed)
Patient diagnosis, treatment,medications and plan of care discussed. InterpreterShanon Brow [phone].

## 2022-08-02 NOTE — Inpatient Diabetes Management (Addendum)
Inpatient Diabetes Program Recommendations  AACE/ADA: New Consensus Statement on Inpatient Glycemic Control (2015)  Target Ranges:  Prepandial:   less than 140 mg/dL      Peak postprandial:   less than 180 mg/dL (1-2 hours)      Critically ill patients:  140 - 180 mg/dL   Lab Results  Component Value Date   GLUCAP 349 (H) 08/02/2022   HGBA1C 10.0 (A) 06/26/2022    Review of Glycemic Control  Latest Reference Range & Units 08/01/22 21:05 08/02/22 00:30 08/02/22 07:50  Glucose-Capillary 70 - 99 mg/dL 512 (HH) 494 (H) 349 (H)  (HH): Data is critically high (H): Data is abnormally high  Diabetes history: DM2 Outpatient Diabetes medications: 70/30 20 units BID Current orders for Inpatient glycemic control: Semglee 10 units QD, Novolog 0-15 units TID and 0-5 units QHS, Solumedrol QD  ESRD-HD MWF  Inpatient Diabetes Program Recommendations:    Semglee 20 units QD Novolog 3 units TID with meals if he consumes at least 50%  Addendum@12 :44:  Spoke with patient at bedside with [employer] interpreter Adriel # [phone].   He has recently changed clinics for PCP and ran out of insulin before his appointment with the new PCP.  He States he administers 15 units of 70/30 BID.  He does not drink beverages with sugar unless he is hypoglycemic.  Reviewed patient's current A1c of 10% (average BG of 240 mg/dL). Explained what a A1c is and what it measures. Also reviewed goal A1c with patient, importance of good glucose control @ home, and blood sugar goals.  He states he was able to obtain insulin through his new PCP in early March and he has plenty at home.  Pays a total of $20/month for all of his medications.  Ordered LWWD in [place].     Will continue to follow while inpatient.  Thank you, Alvin Dixon, MSN, [facility] Diabetes Coordinator Inpatient Diabetes Program [phone] (team pager from 8a-5p)

## 2022-08-03 ENCOUNTER — Other Ambulatory Visit (HOSPITAL_COMMUNITY): Payer: Self-pay

## 2022-08-03 DIAGNOSIS — H4050X0 Glaucoma secondary to other eye disorders, unspecified eye, stage unspecified: Secondary | ICD-10-CM

## 2022-08-03 LAB — GLUCOSE, CAPILLARY
Glucose-Capillary: 164 mg/dL — ABNORMAL HIGH (ref 70–99)
Glucose-Capillary: 176 mg/dL — ABNORMAL HIGH (ref 70–99)
Glucose-Capillary: 292 mg/dL — ABNORMAL HIGH (ref 70–99)
Glucose-Capillary: 88 mg/dL (ref 70–99)

## 2022-08-03 LAB — HEMOGLOBIN A1C
Hgb A1c MFr Bld: 10.7 % — ABNORMAL HIGH (ref 4.8–5.6)
Mean Plasma Glucose: 260 mg/dL

## 2022-08-03 LAB — HEPATITIS B SURFACE ANTIBODY, QUANTITATIVE: Hep B S AB Quant (Post): 322.8 m[IU]/mL (ref >9.9)

## 2022-08-03 MED ORDER — MUPIROCIN 2 % EX OINT
1.0000 | TOPICAL_OINTMENT | Freq: Two times a day (BID) | CUTANEOUS | 0 refills | Status: DC
  Filled 2022-08-03: qty 22, 11d supply, fill #0

## 2022-08-03 MED ORDER — CHLORHEXIDINE GLUCONATE CLOTH 2 % EX PADS
6.0000 | MEDICATED_PAD | Freq: Every day | CUTANEOUS | Status: DC
  Administered 2022-08-03: 6 via TOPICAL

## 2022-08-03 MED ORDER — DORZOLAMIDE HCL 2 % OP SOLN
1.0000 [drp] | Freq: Two times a day (BID) | OPHTHALMIC | 0 refills | Status: AC
  Filled 2022-08-03 – 2022-09-02 (×2): qty 10, 100d supply, fill #0

## 2022-08-03 MED ORDER — PREDNISOLONE ACETATE 1 % OP SUSP
1.0000 [drp] | Freq: Three times a day (TID) | OPHTHALMIC | 0 refills | Status: DC
  Filled 2022-08-03 – 2022-09-02 (×2): qty 5, 34d supply, fill #0

## 2022-08-03 MED ORDER — TIMOLOL MALEATE 0.5 % OP SOLN
1.0000 [drp] | Freq: Two times a day (BID) | OPHTHALMIC | 0 refills | Status: AC
  Filled 2022-08-03: qty 3, 30d supply, fill #0
  Filled 2022-09-02: qty 5, 50d supply, fill #0

## 2022-08-03 MED ORDER — BRIMONIDINE TARTRATE 0.2 % OP SOLN
1.0000 [drp] | Freq: Three times a day (TID) | OPHTHALMIC | 0 refills | Status: AC
  Filled 2022-08-03: qty 4.5, 30d supply, fill #0
  Filled 2022-09-02: qty 10, 66d supply, fill #0

## 2022-08-03 MED ORDER — INSULIN ASPART 100 UNIT/ML IJ SOLN
0.0000 [IU] | Freq: Three times a day (TID) | INTRAMUSCULAR | Status: DC
  Administered 2022-08-03: 8 [IU] via SUBCUTANEOUS

## 2022-08-03 MED ORDER — ERYTHROMYCIN 5 MG/GM OP OINT
TOPICAL_OINTMENT | Freq: Two times a day (BID) | OPHTHALMIC | 0 refills | Status: DC
  Filled 2022-08-03: qty 3.5, fill #0

## 2022-08-03 MED ORDER — MUPIROCIN 2 % EX OINT
1.0000 | TOPICAL_OINTMENT | Freq: Two times a day (BID) | CUTANEOUS | Status: DC
  Administered 2022-08-03: 1 via NASAL
  Filled 2022-08-03: qty 22

## 2022-08-03 NOTE — Plan of Care (Signed)
  Problem: Nutritional: Goal: Maintenance of adequate nutrition will improve Outcome: Progressing   Problem: Tissue Perfusion: Goal: Adequacy of tissue perfusion will improve Outcome: Progressing   Problem: Clinical Measurements: Goal: Respiratory complications will improve Outcome: Progressing Goal: Cardiovascular complication will be avoided Outcome: Progressing   Problem: Nutrition: Goal: Adequate nutrition will be maintained Outcome: Progressing   Problem: Coping: Goal: Level of anxiety will decrease Outcome: Progressing

## 2022-08-03 NOTE — Progress Notes (Signed)
Subjective: Seen in his room, states that eye pain pain is resolved with meds, said tolerated dialysis yesterday  Objective Vital signs in last 24 hours: Vitals:   08/02/22 1820 08/02/22 2110 08/03/22 0415 08/03/22 0940  BP: (!) 89/77 (!) 152/60 (!) 125/52 (!) 141/63  Pulse: 100 94 73 80  Resp: 18 16 18 17   Temp: 98.4 F (36.9 C) 98.6 F (37 C) 97.8 F (36.6 C) 97.6 F (36.4 C)  TempSrc: Oral Oral Oral Oral  SpO2: 100% 99% 96% 100%  Weight:      Height:       Weight change:   Physical Exam: General: Alert adult male chronically ill-appearing, smiling pleasant today NAD Heart: RRR no MRG Lungs: CTA nonlabored breathing room air Abdomen: NABS soft nontender nondistended Extremities: Bilateral dialysis BKA no edema  Access: Left UE AV fistula positive bruit  H ome meds include - insulin glargine/ 70-30, midodrine 10mg  tiw pre hd and/or 10mg  tid, biktarvy, prns/ vits/ supps     OP HD: MWF NW 4h   400/1.5 46.5kg   2/2 bath   AVF  Heparin 2000  - last HD 3/20, post wt 50.8kg   - rocaltrol 1.0 mcg po tiw - no esa, last Hb 12.6  Problem/Plan: Left eye 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 Roys ophthalmology, started admitted yesterday with improvement pain today patient asked about going home ESRD -HD yesterday tolerated MWF schedule Hyperkalemia improved with ED treatment and also HD yesterday Anemia -Hgb 12.2 no ESA needed follow-up per outpatient protocol Secondary hyperparathyroidism -calcium /phosphorus at goal continue vitamin D as outpatient /lanthanum carbonate binder listed follow-up outpatient trend HTN/volume -BPs stable, no excess volume okay by exam, no EDW change/continue midodrine as outpatient also  Ernest Haber, PA-C [facility] Beeper [phone] 08/03/2022,11:51 AM  LOS: 1 day   Labs: Basic Metabolic Panel: Recent Labs  Lab 08/01/22 1807 08/01/22 2353 08/02/22 0950 08/02/22 0952  NA 127* 130*  --  131*  K 6.5* 4.7  --  5.4*   CL 88* 86*  --  92*  CO2 26 24  --  23  GLUCOSE 588* 521*  --  313*  BUN 35* 36*  --  43*  CREATININE 9.05* 9.92*  --  10.84*  CALCIUM 8.9 8.9  --  9.3  PHOS  --  3.6 3.1  --    Liver Function Tests: Recent Labs  Lab 08/01/22 2353  AST 22  ALT 10  ALKPHOS 88  BILITOT 0.9  PROT 8.1  ALBUMIN 3.6   No results for input(s): "LIPASE", "AMYLASE" in the last 168 hours. No results for input(s): "AMMONIA" in the last 168 hours. CBC: Recent Labs  Lab 08/01/22 1807 08/01/22 2353 08/02/22 1330  WBC 7.9 8.7 11.0*  NEUTROABS 5.5  --   --   HGB 12.6* 11.3* 12.2*  HCT 36.9* 34.3* 36.7*  MCV 96.3 98.0 97.3  PLT 193 179 213   Cardiac Enzymes: No results for input(s): "CKTOTAL", "CKMB", "CKMBINDEX", "TROPONINI" in the last 168 hours. CBG: Recent Labs  Lab 08/02/22 2020 08/03/22 0025 08/03/22 0412 08/03/22 0729 08/03/22 1134  GLUCAP 264* 164* 176* 292* 88    Studies/Results: TEMPORAL ARTERY  Result Date: 08/02/2022  TEMPORAL ARTERY REPORT Patient Name:  IZAIA ARONSON  Date of Exam:   08/02/2022 Medical Rec #: [id number]               Accession #:    [id number] Date of Birth: April 17, 1971  Patient Gender: M Patient Age:   52 years Exam Location:  [facility] Procedure:      VAS [place] TEMPORAL ARTERY BILATERAL Referring Phys: Archie Patten HALL --------------------------------------------------------------------------------  Indications: Blurred vision. Left eye and temporal pain x two weeks. High Risk Factors: Age > 50 yrs. ESRD on dialysis and diabetes mellitus.  Comparison Study: No prior studies. Performing Technologist: Darlin Coco RDMS, RVT  Examination Guidelines: Patient in reclined position. 2D, color and spectral doppler sampling in the temporal artery along the hairline and temple in the longitudinal plane. 2D images along the hairline and temple in the transverse plane. Exam is bilateral.  Calcification noted of the bilateral temporal arteries, left > right.  Wall thickness measures 0.7 mm at the distal left temporal artery and 0.5 mm at the distal right temporal artery. The left temporal artery exhibits poor compressibility in the setting of calcification. The bilateral parietal and frontal arteries appear within normal limits.   Preliminary    DG Chest Portable 1 View  Result Date: 08/01/2022 CLINICAL DATA:  End-stage renal disease EXAM: PORTABLE CHEST 1 VIEW COMPARISON:  12/08/2020 FINDINGS: The heart size and mediastinal contours are within normal limits. Both lungs are clear. minimal scarring or atelectasis left base. The visualized skeletal structures are unremarkable. IMPRESSION: No active disease. Minimal scarring or atelectasis at the left base. Electronically Signed   By: Donavan Foil M.D.   On: 08/01/2022 21:13   Medications:   bictegravir-emtricitabine-tenofovir AF  1 tablet Oral Daily   brimonidine  1 drop Left Eye TID   Chlorhexidine Gluconate Cloth  6 each Topical Q0600   Chlorhexidine Gluconate Cloth  6 each Topical Q0600   dorzolamide  1 drop Left Eye BID   erythromycin   Left Eye BID   heparin  5,000 Units Subcutaneous Q8H   insulin aspart  0-15 Units Subcutaneous TID WC   insulin aspart  5 Units Subcutaneous TID WC   insulin glargine-yfgn  16 Units Subcutaneous Daily   lanthanum  1,000 mg Oral TID   midodrine  10 mg Oral Once per day on Mon Wed Fri   mupirocin ointment  1 Application Nasal BID   pantoprazole (PROTONIX) IV  40 mg Intravenous Daily   pravastatin  80 mg Oral q1800   prednisoLONE acetate  1 drop Left Eye TID   timolol  1 drop Left Eye BID

## 2022-08-03 NOTE — Progress Notes (Signed)
This patient was a source of a blood borne pathogen exposure, patient made aware (interpreter used) labs drawn.

## 2022-08-03 NOTE — Discharge Summary (Signed)
Physician Discharge Summary  Alvin Daniels [id number] DOB: 1970-06-15 DOA: 08/01/2022  PCP: Dorna Mai, MD  Admit date: 08/01/2022 Discharge date: 08/03/2022 Recommendations for Outpatient Follow-up:  Follow up with PCP in 1 weeks-call for appointment Please obtain BMP/CBC in one week Dr [name] in 3-5  days- please call office.  Discharge Dispo: home Discharge Condition: Stable Code Status:   Code Status: Full Code Diet recommendation:  Diet Order             Diet renal/carb modified with fluid restriction Diet-HS Snack? Nothing; Fluid restriction: 1200 mL Fluid; Room service appropriate? Yes; Fluid consistency: Thin  Diet effective now                    Brief/Interim Summary: 52 y.o.m w/  T2DM,HIV,ESRD on HD MWF, last hemodialysis was on Wednesday,presented to [facility] ED sent from urgent care to rule out left temporal arteritis due to left eye pain, left temporal artery pain, and blurry vision.  Reportedly, the patient has been seen by ophthalmology for the same complaint outpatient.   EDP discussed the case with ophthalmology, Dr. Leda Roys ophthalmology the patient does not need to be seen acutely, can follow-up as an outpatient with them.   EDP also discussed the case with nephrology due to severe hyperkalemia with a potassium of 6.5 and EKG abnormality.  No need for emergent hemodialysis overnight per nephrology, okay to treat medically.  His hyperkalemia was treated with Lokelma, IV insulin, IV calcium gluconate, IV D50, and albuterol. And admission was requested for further management  IN ed: Tmax 98.9.  BP 100/45, pulse 109, respiratory 18, saturation 97% on room air.  Lab studies remarkable for hemoglobin 11.3.  Serum potassium 6.5, serum glucose 588. Patient was subsequently admitted was seen by vascular surgery-and ophthalmology felt that patient's eye pain was due to glaucoma low suspicion for giant cell arteritis, he already lost the vision in the left eye 2  years ago and it has not changed since.  He has has a Pred Forte on board CRP negative, patient was started on glaucoma regimen with different drops following which his eye pain has resolved.  He would like to be discharged today-and he will feel better at home does not want to go  further workup and he will follow-up with his ophthalmology Dr [name] as OP   Discharge Diagnoses:  Principal Problem:   Hyperkalemia Active Problems:   Neovascular glaucoma  PROGRESS NOTE Alvin Daniels  [id number] DOB: Oct 21, 1970 DOA: 08/01/2022 PCP: Dorna Mai, MD  Brief Narrative/Hospital Course: 52 y.o.m w/  T2DM,HIV,ESRD on HD MWF, last hemodialysis was on Wednesday,presented to [facility] ED sent from urgent care to rule out left temporal arteritis due to left eye pain, left temporal artery pain, and blurry vision.  Reportedly, the patient has been seen by ophthalmology for the same complaint outpatient.   EDP discussed the case with ophthalmology, Dr. Leda Roys ophthalmology the patient does not need to be seen acutely, can follow-up as an outpatient with them.   EDP also discussed the case with nephrology due to severe hyperkalemia with a potassium of 6.5 and EKG abnormality.  No need for emergent hemodialysis overnight per nephrology, okay to treat medically.  His hyperkalemia was treated with Lokelma, IV insulin, IV calcium gluconate, IV D50, and albuterol. And admission was requested for further management  IN ed: Tmax 98.9.  BP 100/45, pulse 109, respiratory 18, saturation 97% on room air.  Lab studies remarkable for hemoglobin 11.3.  Serum potassium 6.5,  serum glucose 588. Patient was subsequently admitted was seen by vascular surgery-and ophthalmology felt that patient's eye pain was due to glaucoma low suspicion for giant cell arteritis, he already lost the vision in the left eye 2 years ago and it has not changed since.  He has has a Pred Forte on board CRP negative, patient was started on glaucoma  regimen with different drops following which his eye pain has resolved.  He would like to be discharged today-and he will feel better at home does not want to go  further workup and he will follow-up with his ophthalmology Dr [name] as OP   Subjective: Seen and examined, Overnight vitals/labs/events reviewed  C/o pain on left temporal area and difficulty vision Overnight blood sugar running high Used interpreter Sees patient states  HE SEES "little bit not too much" with left eye for about 2 years but it is severe since  last Friday due to pain-so came to ED. Vision has been poor in left eye able to see shadow and able to see fingers when held infront of left eye but cannot count.     Assessment and Plan: Principal Problem:   Hyperkalemia Active Problems:   Neovascular glaucoma   Severe hyperkalemia status post temporizing measures with improvement and underwent HD for elevated hyperkalemia 3/22 ESRD and HD MWF s/p HD 3/22 Anemia of chronic renal disease hemoglobin stable  Blind Painful eye on left: Initially seen to ED due to concern for left temporal arteritis but on subsequent history, examination and further evaluation by vascular surgery, after using- Low suspicion for giant cell arteritis, CRP negative, ESR at 41 but has been higher than that upto 126 3 yrs ago.I had discussed w/ [name] and Dr Malachy Chamber- they did not feel he needs biopsy-, placed on glaucoma regimen with different eyedrops following which his left eye pain has resolved.  Will follow-up with pathology next week, he will be discharged on current eyedrops also provided with current supplies for his eyedrops and sent prescription to [facility] advised.  I discussed with Dr. [name] and okay for discharge home on 3/23  Chronic hypotension on midodrine Type 2 diabetes mellitus with uncontrolled hyperglycemia: Blood sugar running in the 500 in the setting of steroid use, now stabilizing in 160s due to, he will resume his home  insulin 70/30 upon discharge, not in DKA beta-hydroxybutyrate was negative advised to check-ACHS blood sugar at home Recent Labs  Lab 08/01/22 2353 08/02/22 0030 08/02/22 1816 08/02/22 2020 08/03/22 0025 08/03/22 0412 08/03/22 0729  GLUCAP  --    < > 123* 264* 164* 176* 292*  HGBA1C 10.7*  --   --   --   --   --   --    < > = values in this interval not displayed.    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   Consults: Vascular surgery, nephrology, ophthalmology Subjective: Alert awake oriented, no more pain on the left eye, requesting for discharge home  Discharge Exam: Vitals:   08/03/22 0415 08/03/22 0940  BP: (!) 125/52 (!) 141/63  Pulse: 73 80  Resp: 18 17  Temp: 97.8 F (36.6 C) 97.6 F (36.4 C)  SpO2: 96% 100%   General: Pt is alert, awake, not in acute distress Cardiovascular: RRR, S1/S2 +, no rubs, no gallops  Respiratory: CTA bilaterally, no wheezing, no rhonchi Abdominal: Soft, NT, ND, bowel sounds + Extremities: no edema, no cyanosis  Discharge Instructions  Discharge Instructions     Discharge instructions   Complete by: As directed    Please follow-up with ophthalmology Dr. Dr. [name] in few days for ongoing management   Increase activity slowly   Complete by: As directed       Allergies as of 08/03/2022   No Known Allergies      Medication List     STOP taking these medications    Basaglar KwikPen 100 UNIT/ML       TAKE these medications    Biktarvy 50-200-25 MG Tabs tablet Generic drug: bictegravir-emtricitabine-tenofovir AF Take 1 tablet by mouth daily.   brimonidine 0.2 % ophthalmic solution Commonly known as: ALPHAGAN Place 1 drop into the left eye 3 (three) times daily.   dorzolamide 2 % ophthalmic solution Commonly known as: TRUSOPT Place 1 drop into the left  eye 2 (two) times daily.   erythromycin ophthalmic ointment Place into the left eye 2 (two) times daily.   HumuLIN 70/30 (70-30) 100 UNIT/ML injection Generic drug: insulin NPH-regular Human Inject 20 units subcutaneously twice daily with meals   Insulin Syringe-Needle U-100 31G X 15/64" 0.5 ML Misc Commonly known as: BD Insulin Syringe Ultrafine 1 each by Does not apply route 2 (two) times daily.   midodrine 10 MG tablet Commonly known as: PROAMATINE Take 1 tablet (10 mg total) by mouth 3 (three) times daily with meals. In Spanish What changed: Another medication with the same name was removed. Continue taking this medication, and follow the directions you see here.   mupirocin ointment 2 % Commonly known as: BACTROBAN Place 1 Application into the nose 2 (two) times daily.   Pitavastatin Magnesium 4 MG Tabs Take 4 mg by mouth daily.   prednisoLONE acetate 1 % ophthalmic suspension Commonly known as: PRED FORTE Place 1 drop into the left eye 3 (three) times daily.   timolol 0.5 % ophthalmic solution Commonly known as: TIMOPTIC Place 1 drop into the left eye 2 (two) times daily.   True Metrix Blood Glucose Test test strip Generic drug: glucose blood use 1 test strip to check glucose 3 (three) times daily.   True Metrix Meter w/Device Kit Use kit to check glucose 3 (three) times daily.   TRUEplus Lancets 28G Misc Use to test 3 (three) times daily.        Follow-up Information     Dorna Mai, MD Follow up in 1 week(s).   Specialty: Family Medicine Contact information: [place] [address] [phone]         Awanda Mink, MD Follow up in 5 day(s).   Specialty: Ophthalmology Contact information: [place] [address] [phone]                No Known Allergies  The results of significant diagnostics from this hospitalization (including imaging, microbiology, ancillary and  laboratory) are listed below for reference.    Microbiology: Recent Results (from the past 240 hour(s))  MRSA Next Gen by PCR, Nasal     Status: Abnormal   Collection Time: 08/02/22  9:04 PM   Specimen: Nasal Mucosa; Nasal Swab  Result Value Ref Range Status   MRSA by PCR Next Gen DETECTED (A) NOT DETECTED Final    Comment: RESULT CALLED TO, READ BACK BY AND VERIFIED WITH: K. GENGLER RN 08/02/22 @ 2249 BY AB (  NOTE) The GeneXpert MRSA Assay (FDA approved for NASAL specimens only), is one component of a comprehensive MRSA colonization surveillance program. It is not intended to diagnose MRSA infection nor to guide or monitor treatment for MRSA infections. Test performance is not FDA approved in patients less than 42 years old. Performed at [facility], [place] [address] [place], [place] [address]     Procedures/Studies: TEMPORAL ARTERY  Result Date: 08/02/2022  TEMPORAL ARTERY REPORT Patient Name:  Alvin Daniels  Date of Exam:   08/02/2022 Medical Rec #: [id number]               Accession #:    [id number] Date of Birth: 29-Apr-1971                Patient Gender: M Patient Age:   52 years Exam Location:  [facility] Procedure:      VAS [place] TEMPORAL ARTERY BILATERAL Referring Phys: Irene Pap --------------------------------------------------------------------------------  Indications: Blurred vision. Left eye and temporal pain x two weeks. High Risk Factors: Age > 50 yrs. ESRD on dialysis and diabetes mellitus.  Comparison Study: No prior studies. Performing Technologist: Darlin Coco RDMS, RVT  Examination Guidelines: Patient in reclined position. 2D, color and spectral doppler sampling in the temporal artery along the hairline and temple in the longitudinal plane. 2D images along the hairline and temple in the transverse plane. Exam is bilateral.  Calcification noted of the bilateral temporal arteries, left > right. Wall thickness measures 0.7 mm at the distal left temporal  artery and 0.5 mm at the distal right temporal artery. The left temporal artery exhibits poor compressibility in the setting of calcification. The bilateral parietal and frontal arteries appear within normal limits.   Preliminary    DG Chest Portable 1 View  Result Date: 08/01/2022 CLINICAL DATA:  End-stage renal disease EXAM: PORTABLE CHEST 1 VIEW COMPARISON:  12/08/2020 FINDINGS: The heart size and mediastinal contours are within normal limits. Both lungs are clear. minimal scarring or atelectasis left base. The visualized skeletal structures are unremarkable. IMPRESSION: No active disease. Minimal scarring or atelectasis at the left base. Electronically Signed   By: Donavan Foil M.D.   On: 08/01/2022 21:13    Labs: BNP (last 3 results) No results for input(s): "BNP" in the last 8760 hours. Basic Metabolic Panel: Recent Labs  Lab 08/01/22 1807 08/01/22 2353 08/02/22 0950 08/02/22 0952  NA 127* 130*  --  131*  K 6.5* 4.7  --  5.4*  CL 88* 86*  --  92*  CO2 26 24  --  23  GLUCOSE 588* 521*  --  313*  BUN 35* 36*  --  43*  CREATININE 9.05* 9.92*  --  10.84*  CALCIUM 8.9 8.9  --  9.3  MG  --  2.9* 3.0*  --   PHOS  --  3.6 3.1  --    Liver Function Tests: Recent Labs  Lab 08/01/22 2353  AST 22  ALT 10  ALKPHOS 88  BILITOT 0.9  PROT 8.1  ALBUMIN 3.6   No results for input(s): "LIPASE", "AMYLASE" in the last 168 hours. No results for input(s): "AMMONIA" in the last 168 hours. CBC: Recent Labs  Lab 08/01/22 1807 08/01/22 2353 08/02/22 1330  WBC 7.9 8.7 11.0*  NEUTROABS 5.5  --   --   HGB 12.6* 11.3* 12.2*  HCT 36.9* 34.3* 36.7*  MCV 96.3 98.0 97.3  PLT 193 179 213   Recent Labs  Lab 08/02/22 1816  08/02/22 2020 08/03/22 0025 08/03/22 0412 08/03/22 0729  GLUCAP 123* 264* 164* 176* 292*   Recent Labs    08/01/22 2353  HGBA1C 10.7*  Anemia work up No results for input(s): "VITAMINB12", "FOLATE", "FERRITIN", "TIBC", "IRON", "RETICCTPCT" in the last 72  hours. Urinalysis    Component Value Date/Time   COLORURINE YELLOW 07/04/2010 1840   APPEARANCEUR TURBID (A) 07/04/2010 1840   LABSPEC 1.013 07/04/2010 1840   PHURINE 7.0 07/04/2010 1840   GLUCOSEU NEG mg/dL 07/04/2010 1840   HGBUR TRACE (A) 03/13/2010 1824   BILIRUBINUR NEG 07/04/2010 1840   KETONESUR NEG mg/dL 07/04/2010 1840   PROTEINUR > 300 mg/dL (A) 07/04/2010 1840   UROBILINOGEN 0.2 07/04/2010 1840   NITRITE NEG 07/04/2010 1840   LEUKOCYTESUR LARGE (A) 07/04/2010 1840   Sepsis Labs Recent Labs  Lab 08/01/22 1807 08/01/22 2353 08/02/22 1330  WBC 7.9 8.7 11.0*   Microbiology Recent Results (from the past 240 hour(s))  MRSA Next Gen by PCR, Nasal     Status: Abnormal   Collection Time: 08/02/22  9:04 PM   Specimen: Nasal Mucosa; Nasal Swab  Result Value Ref Range Status   MRSA by PCR Next Gen DETECTED (A) NOT DETECTED Final    Comment: RESULT CALLED TO, READ BACK BY AND VERIFIED WITH: K. GENGLER RN 08/02/22 @ [id number] BY AB (NOTE) The GeneXpert MRSA Assay (FDA approved for NASAL specimens only), is one component of a comprehensive MRSA colonization surveillance program. It is not intended to diagnose MRSA infection nor to guide or monitor treatment for MRSA infections. Test performance is not FDA approved in patients less than 60 years old. Performed at [facility], [place] [address], [place], [place] [address]    Time coordinating discharge: 35 minutes  SIGNED: Antonieta Pert, MD  [facility] 08/03/2022, 10:46 AM  If 7PM-7AM, please contact night-coverage [email or web address]

## 2022-08-05 ENCOUNTER — Telehealth: Payer: Self-pay

## 2022-08-05 DIAGNOSIS — E1139 Type 2 diabetes mellitus with other diabetic ophthalmic complication: Secondary | ICD-10-CM | POA: Insufficient documentation

## 2022-08-05 NOTE — Transitions of Care (Post Inpatient/ED Visit) (Signed)
   08/05/2022  Name: Alvin Daniels MRN: [id number] DOB: 01-29-1971  Today's TOC FU Call Status: Today's TOC FU Call Status:: Successful TOC FU Call Competed TOC FU Call Complete Date: 08/05/22  Transition Care Management Follow-up Telephone Call Date of Discharge: 08/03/22 Discharge Facility: [facility]) Type of Discharge: Inpatient Admission Primary Inpatient Discharge Diagnosis:: left eye pain How have you been since you were released from the hospital?: Better Any questions or concerns?: No  Items Reviewed: Did you receive and understand the discharge instructions provided?: Yes Medications obtained and verified?: Yes (Medications Reviewed) Any new allergies since your discharge?: No Dietary orders reviewed?: Yes  Home Care and Equipment/Supplies: [place] Ordered?: No Any new equipment or medical supplies ordered?: No  Functional Questionnaire: Do you need assistance with bathing/showering or dressing?: No Do you need assistance with meal preparation?: No Do you need assistance with eating?: No Do you have difficulty maintaining continence: No Do you need assistance with getting out of bed/getting out of a chair/moving?: No Do you have difficulty managing or taking your medications?: No  Follow up appointments reviewed: PCP Follow-up appointment confirmed?: Yes Date of PCP follow-up appointment?: 08/14/22 Follow-up Provider: Dr. Dorna Mai Specialist [facility] Follow-up appointment confirmed?: No Reason Specialist Follow-Up Not Confirmed: Patient has Specialist Provider Number and will Call for Appointment Alvin Daniels, Alvin Rhymes, Alvin Daniels) Do you need transportation to your follow-up appointment?: No Do you understand care options if your condition(s) worsen?: Yes-patient verbalized understanding  Norton Blizzard, [place] ([place])  [facility] [phone]

## 2022-08-05 NOTE — Progress Notes (Signed)
Late Note Entry  Pt d/c to home on Saturday. Contacted [facility] to advise clinic of pt's d/c and that pt should resume care today.   Melven Sartorius Renal Navigator [phone]

## 2022-08-05 NOTE — Transitions of Care (Post Inpatient/ED Visit) (Signed)
   08/05/2022  Name: Alvin Daniels MRN: [id number] DOB: 05-01-1971  Today's TOC FU Call Status: Today's TOC FU Call Status:: Unsuccessful Call (2nd Attempt) TOC FU Call Complete Date: 08/05/22  Attempted to reach the patient regarding the most recent Inpatient/ED visit.  Follow Up Plan: Additional outreach attempts will be made to reach the patient to complete the Transitions of Care (Post Inpatient/ED visit) call.   Signature Juanda Crumble, [place] Direct Dial [phone]

## 2022-08-06 NOTE — Transitions of Care (Post Inpatient/ED Visit) (Signed)
   08/06/2022  Name: Alvin Daniels MRN: [id number] DOB: 01-May-1971  Today's TOC FU Call Status: Today's TOC FU Call Status:: Unsuccessful Call (2nd Attempt) Unsuccessful Call (2nd Attempt) Date: 08/06/22 [facility] FU Call Complete Date: 08/05/22  Attempted to reach the patient regarding the most recent Inpatient/ED visit.  Follow Up Plan: Additional outreach attempts will be made to reach the patient to complete the Transitions of Care (Post Inpatient/ED visit) call.   Signature Juanda Crumble, [place] Direct Dial [phone]

## 2022-08-11 ENCOUNTER — Emergency Department (HOSPITAL_COMMUNITY): Payer: Self-pay

## 2022-08-11 ENCOUNTER — Emergency Department (HOSPITAL_COMMUNITY)
Admission: EM | Admit: 2022-08-11 | Discharge: 2022-08-12 | Disposition: A | Discharge status: Home / Self Care | Payer: Self-pay | Attending: Emergency Medicine | Admitting: Emergency Medicine

## 2022-08-11 DIAGNOSIS — N186 End stage renal disease: Secondary | ICD-10-CM | POA: Insufficient documentation

## 2022-08-11 DIAGNOSIS — H538 Other visual disturbances: Secondary | ICD-10-CM | POA: Insufficient documentation

## 2022-08-11 DIAGNOSIS — Z992 Dependence on renal dialysis: Secondary | ICD-10-CM | POA: Insufficient documentation

## 2022-08-11 DIAGNOSIS — Z794 Long term (current) use of insulin: Secondary | ICD-10-CM | POA: Insufficient documentation

## 2022-08-11 DIAGNOSIS — R519 Headache, unspecified: Secondary | ICD-10-CM | POA: Insufficient documentation

## 2022-08-11 DIAGNOSIS — L989 Disorder of the skin and subcutaneous tissue, unspecified: Secondary | ICD-10-CM | POA: Insufficient documentation

## 2022-08-11 LAB — COMPREHENSIVE METABOLIC PANEL
ALT: 13 U/L (ref 0–44)
AST: 18 U/L (ref 15–41)
Albumin: 3.6 g/dL (ref 3.5–5.0)
Alkaline Phosphatase: 117 U/L (ref 38–126)
Anion gap: 16 — ABNORMAL HIGH (ref 5–15)
BUN: 41 mg/dL — ABNORMAL HIGH (ref 6–20)
CO2: 23 mmol/L (ref 22–32)
Calcium: 8.6 mg/dL — ABNORMAL LOW (ref 8.9–10.3)
Chloride: 84 mmol/L — ABNORMAL LOW (ref 98–111)
Creatinine, Ser: 9.73 mg/dL — ABNORMAL HIGH (ref 0.61–1.24)
GFR, Estimated: 6 mL/min — ABNORMAL LOW (ref >60)
Glucose, Bld: 484 mg/dL — ABNORMAL HIGH (ref 70–99)
Potassium: 5 mmol/L (ref 3.5–5.1)
Sodium: 123 mmol/L — ABNORMAL LOW (ref 135–145)
Total Bilirubin: 1 mg/dL (ref 0.3–1.2)
Total Protein: 7.7 g/dL (ref 6.5–8.1)

## 2022-08-11 LAB — CBC WITH DIFFERENTIAL/PLATELET
Abs Immature Granulocytes: 0.04 10*3/uL (ref 0.00–0.07)
Basophils Absolute: 0 10*3/uL (ref 0.0–0.1)
Basophils Relative: 0 %
Eosinophils Absolute: 0.2 10*3/uL (ref 0.0–0.5)
Eosinophils Relative: 2 %
HCT: 31.4 % — ABNORMAL LOW (ref 39.0–52.0)
Hemoglobin: 11 g/dL — ABNORMAL LOW (ref 13.0–17.0)
Immature Granulocytes: 0 %
Lymphocytes Relative: 11 %
Lymphs Abs: 1.2 10*3/uL (ref 0.7–4.0)
MCH: 32.4 pg (ref 26.0–34.0)
MCHC: 35 g/dL (ref 30.0–36.0)
MCV: 92.4 fL (ref 80.0–100.0)
Monocytes Absolute: 0.7 10*3/uL (ref 0.1–1.0)
Monocytes Relative: 7 %
Neutro Abs: 8.5 10*3/uL — ABNORMAL HIGH (ref 1.7–7.7)
Neutrophils Relative %: 80 %
Platelets: 201 10*3/uL (ref 150–400)
RBC: 3.4 MIL/uL — ABNORMAL LOW (ref 4.22–5.81)
RDW: 12.2 % (ref 11.5–15.5)
WBC: 10.6 10*3/uL — ABNORMAL HIGH (ref 4.0–10.5)
nRBC: 0 % (ref 0.0–0.2)

## 2022-08-11 LAB — CBG MONITORING, ED: Glucose-Capillary: 444 mg/dL — ABNORMAL HIGH (ref 70–99)

## 2022-08-11 MED ORDER — DIPHENHYDRAMINE HCL 25 MG PO CAPS
25.0000 mg | ORAL_CAPSULE | Freq: Once | ORAL | Status: AC
  Administered 2022-08-11: 25 mg via ORAL
  Filled 2022-08-11: qty 1

## 2022-08-11 MED ORDER — INSULIN ASPART 100 UNIT/ML IJ SOLN
8.0000 [IU] | Freq: Once | INTRAMUSCULAR | Status: AC
  Administered 2022-08-11: 8 [IU] via SUBCUTANEOUS

## 2022-08-11 MED ORDER — SODIUM CHLORIDE 0.9 % IV BOLUS
500.0000 mL | Freq: Once | INTRAVENOUS | Status: AC
  Administered 2022-08-11: 500 mL via INTRAVENOUS

## 2022-08-11 MED ORDER — METOCLOPRAMIDE HCL 5 MG/ML IJ SOLN
10.0000 mg | Freq: Once | INTRAMUSCULAR | Status: AC
  Administered 2022-08-11: 10 mg via INTRAVENOUS
  Filled 2022-08-11: qty 2

## 2022-08-11 NOTE — ED Triage Notes (Signed)
Patient reports frontal headache onset 3 days ago , denies head injury , he also has a skin scalp abscess with drainage onset last week , denies fever .

## 2022-08-11 NOTE — ED Provider Triage Note (Signed)
Emergency Medicine Provider Triage Evaluation Note  Alvin Daniels , a 52 y.o. male  was evaluated in triage.  Pt complains of concerns for headache x 4 days. Headache has been constant. Denies drainage from the area. Gets dialysis M, W, F. Was recently admitted to the hospital. Denies fever.   Review of Systems  Positive:  Negative:   Physical Exam  BP (!) 154/80 (BP Location: Right Arm)   Pulse 81   Temp 98.3 F (36.8 C) (Oral)   Resp 16   SpO2 100%  Gen:   Awake, no distress   Resp:  Normal effort  MSK:   Moves extremities without difficulty  Other:  Thrill noted to left AV site. Palpable mass noted to top of scalp with dried blood noted to the area. No fluctuance noted. No surrounding erythema. Grip stength 5/5 bilaterally. Hazy pupil noted on left (family notes has been present for 4 months due to his diabetes and no surgery scheduled)  Medical Decision Making  Medically screening exam initiated at 8:38 PM.  Appropriate orders placed.  [name] was informed that the remainder of the evaluation will be completed by another provider, this initial triage assessment does not replace that evaluation, and the importance of remaining in the ED until their evaluation is complete.  8:44 PM - Discussed with RN that patient is in need of a room immediately. RN aware and working on room placement.    [name] A, PA-C 08/11/22 2044

## 2022-08-12 ENCOUNTER — Other Ambulatory Visit: Payer: Self-pay

## 2022-08-12 ENCOUNTER — Encounter (HOSPITAL_COMMUNITY): Payer: Self-pay

## 2022-08-12 LAB — CBG MONITORING, ED: Glucose-Capillary: 329 mg/dL — ABNORMAL HIGH (ref 70–99)

## 2022-08-12 MED ORDER — HUMULIN 70/30 (70-30) 100 UNIT/ML ~~LOC~~ SUSP
SUBCUTANEOUS | 5 refills | Status: DC
  Filled 2022-08-12: qty 10, 30d supply, fill #0

## 2022-08-12 MED ORDER — ACETAMINOPHEN 500 MG PO TABS
1000.0000 mg | ORAL_TABLET | ORAL | Status: AC
  Administered 2022-08-12: 1000 mg via ORAL
  Filled 2022-08-12: qty 2

## 2022-08-12 NOTE — ED Provider Notes (Signed)
[facility] Provider Note   CSN: [id number] Arrival date & time: 08/11/22  2014     History  Chief Complaint  Patient presents with   Headache / Scalp Skin Abscess    Alvin Daniels is a 53 y.o. male.  HPI Patient is a 52 year old male with a past medical history significant for ESRD on dialysis Monday Wednesday Friday states he had a full dialysis session on Friday.  Patient states that he was here in the emergency department for a headache for the past 4 days.  He states that it is a frontal headache he states that it is achy constant does not seem to be positional worse with any particular movement.  Denies any fevers chills nausea vomiting lightheadedness or dizziness.  However he did have 1 episode of emesis here in the emergency room.  2 days ago he noticed a lump on the top of his head that grew quite rapidly and then began draining some.  He has not had any fevers he has not had a history of similar issues in the past.  He has had some issues with left eye vision loss which has been present for 2 years or more.  He denies any new issues of his eyes.  Specifically denies any blurry vision double vision or vision loss.     Home Medications Prior to Admission medications   Medication Sig Start Date End Date Taking? Authorizing Provider  bictegravir-emtricitabine-tenofovir AF (BIKTARVY) 50-200-25 MG TABS tablet Take 1 tablet by mouth daily. 05/29/22   Truman Hayward, MD  Blood Glucose Monitoring Suppl (TRUE METRIX METER) w/Device KIT Use kit to check glucose 3 (three) times daily. 12/27/21   [name], MD  brimonidine (ALPHAGAN) 0.2 % ophthalmic solution Place 1 drop into the left eye 3 (three) times daily. 08/03/22 09/02/22  Antonieta Pert, MD  dorzolamide (TRUSOPT) 2 % ophthalmic solution Place 1 drop into the left eye 2 (two) times daily. 08/03/22 09/02/22  Antonieta Pert, MD  erythromycin ophthalmic ointment Place into the  left eye 2 (two) times daily. 08/03/22   Antonieta Pert, MD  glucose blood (TRUE METRIX BLOOD GLUCOSE TEST) test strip use 1 test strip to check glucose 3 (three) times daily. 12/27/21   [name], MD  insulin NPH-regular Human (HUMULIN 70/30) (70-30) 100 UNIT/ML injection Inject 20 units subcutaneously twice daily with meals 06/26/22   Dorna Mai, MD  Insulin Syringe-Needle U-100 (BD INSULIN SYRINGE ULTRAFINE) 31G X 15/64" 0.5 ML MISC 1 each by Does not apply route 2 (two) times daily. 02/10/20   [name], MD  midodrine (PROAMATINE) 10 MG tablet Take 1 tablet (10 mg total) by mouth 3 (three) times daily with meals. In Spanish 01/02/22   Tommy Medal, Lavell Islam, MD  mupirocin ointment (BACTROBAN) 2 % Place 1 Application into the nose 2 (two) times daily. 08/03/22   Antonieta Pert, MD  Pitavastatin Magnesium 4 MG TABS Take 4 mg by mouth daily. Patient not taking: Reported on 08/01/2022 05/29/22   Tommy Medal, Lavell Islam, MD  prednisoLONE acetate (PRED FORTE) 1 % ophthalmic suspension Place 1 drop into the left eye 3 (three) times daily. 08/03/22   Antonieta Pert, MD  timolol (TIMOPTIC) 0.5 % ophthalmic solution Place 1 drop into the left eye 2 (two) times daily. 08/03/22 09/02/22  Antonieta Pert, MD  TRUEplus Lancets 28G MISC Use to test 3 (three) times daily. 12/27/21   [name], MD  bictegravir-emtricitabine-tenofovir AF (BIKTARVY) [id number]  MG TABS tablet Take 1 tablet by mouth daily. 01/02/22   Truman Hayward, MD      Allergies    Patient has no known allergies.    Review of Systems   Review of Systems  Physical Exam Updated Vital Signs BP 122/85   Pulse 81   Temp 98.2 F (36.8 C) (Oral)   Resp 16   Ht 5\' 2"  (1.575 m)   Wt 49.8 kg   SpO2 100%   BMI 20.08 kg/m  Physical Exam Vitals and nursing note reviewed.  Constitutional:      General: He is not in acute distress. HENT:     Head: Normocephalic and atraumatic.     Nose: Nose normal.  Eyes:     General: No scleral  icterus. Cardiovascular:     Rate and Rhythm: Normal rate and regular rhythm.     Pulses: Normal pulses.     Heart sounds: Normal heart sounds.  Pulmonary:     Effort: Pulmonary effort is normal. No respiratory distress.     Breath sounds: No wheezing.  Abdominal:     Palpations: Abdomen is soft.     Tenderness: There is no abdominal tenderness. There is no guarding or rebound.  Musculoskeletal:     Cervical back: Normal range of motion.     Comments: Bilateral BKA  Skin:    General: Skin is warm and dry.     Capillary Refill: Capillary refill takes less than 2 seconds.  Neurological:     Mental Status: He is alert. Mental status is at baseline.     Comments: Bilateral grip strength symmetric, smile symmetric, speaking articulately via translator--per translator  Psychiatric:        Mood and Affect: Mood normal.        Behavior: Behavior normal.     ED Results / Procedures / Treatments   Labs (all labs ordered are listed, but only abnormal results are displayed) Labs Reviewed  COMPREHENSIVE METABOLIC PANEL - Abnormal; Notable for the following components:      Result Value   Sodium 123 (*)    Chloride 84 (*)    Glucose, Bld 484 (*)    BUN 41 (*)    Creatinine, Ser 9.73 (*)    Calcium 8.6 (*)    GFR, Estimated 6 (*)    Anion gap 16 (*)    All other components within normal limits  CBC WITH DIFFERENTIAL/PLATELET - Abnormal; Notable for the following components:   WBC 10.6 (*)    RBC 3.40 (*)    Hemoglobin 11.0 (*)    HCT 31.4 (*)    Neutro Abs 8.5 (*)    All other components within normal limits  CBG MONITORING, ED - Abnormal; Notable for the following components:   Glucose-Capillary 444 (*)    All other components within normal limits  CBG MONITORING, ED - Abnormal; Notable for the following components:   Glucose-Capillary 329 (*)    All other components within normal limits    EKG None  Radiology CT Head Wo Contrast  Result Date: 08/11/2022 CLINICAL DATA:   Headache EXAM: CT HEAD WITHOUT CONTRAST TECHNIQUE: Contiguous axial images were obtained from the base of the skull through the vertex without intravenous contrast. RADIATION DOSE REDUCTION: This exam was performed according to the departmental dose-optimization program which includes automated exposure control, adjustment of the mA and/or kV according to patient size and/or use of iterative reconstruction technique. COMPARISON:  06/28/2020 FINDINGS: Brain: There is  no mass, hemorrhage or extra-axial collection. The size and configuration of the ventricles and extra-axial CSF spaces are normal. The brain parenchyma is normal, without acute or chronic infarction. Vascular: No abnormal hyperdensity of the major intracranial arteries or dural venous sinuses. No intracranial atherosclerosis. Skull: Heterogeneous, predominantly low density lesion of the superior right parietal scalp measures 12 x 8 mm and is new since 06/28/2020. The underlying calvarium is normal. Sinuses/Orbits: No fluid levels or advanced mucosal thickening of the visualized paranasal sinuses. No mastoid or middle ear effusion. The orbits are normal. IMPRESSION: 1. No acute intracranial abnormality. 2. Right parietal scalp skin lesion, likely draining abscess/furuncle. Electronically Signed   By: Ulyses Jarred M.D.   On: 08/11/2022 22:45    Procedures Procedures    Medications Ordered in ED Medications  sodium chloride 0.9 % bolus 500 mL (0 mLs Intravenous Stopped 08/12/22 0002)  insulin aspart (novoLOG) injection 8 Units (8 Units Subcutaneous Given 08/11/22 2311)  metoCLOPramide (REGLAN) injection 10 mg (10 mg Intravenous Given 08/11/22 2313)  diphenhydrAMINE (BENADRYL) capsule 25 mg (25 mg Oral Given 08/11/22 2312)  acetaminophen (TYLENOL) tablet 1,000 mg (1,000 mg Oral Given 08/12/22 0112)    ED Course/ Medical Decision Making/ A&P                             Medical Decision Making Risk OTC drugs. Prescription drug  management.   Patient is a 52 year old male with a past medical history significant for ESRD on dialysis Monday Wednesday Friday states he had a full dialysis session on Friday.  Patient states that he was here in the emergency department for a headache for the past 4 days.  He states that it is a frontal headache he states that it is achy constant does not seem to be positional worse with any particular movement.  Denies any fevers chills nausea vomiting lightheadedness or dizziness.  However he did have 1 episode of emesis here in the emergency room.  2 days ago he noticed a lump on the top of his head that grew quite rapidly and then began draining some.  He has not had any fevers he has not had a history of similar issues in the past.  He has had some issues with left eye vision loss which has been present for 2 years or more.  He denies any new issues of his eyes.  Specifically denies any blurry vision double vision or vision loss.  Physical exam unremarkable.  Patient is chronically ill-appearing.  Vital signs within normal limits  Patient does have headache that has improved here with a Reglan and Benadryl headache cocktail plus half a liter of normal saline and a dose of Tylenol.  Given 8 units of insulin subcu and blood sugar improved from 44-3 29.  Sodium corrects to nearly normal with accounting for hyperglycemia.  CBC with mild nonspecific leukocytosis and mild anemia.  Patient states that he is scheduled for dialysis later today.  IMPRESSION:  1. No acute intracranial abnormality.  2. Right parietal scalp skin lesion, likely draining  abscess/furuncle.      Electronically Signed    By: Ulyses Jarred M.D.    On: 08/11/2022 22:45   CT head without acute abnormal finding.  He does have a skin abscess/furuncle present.  He will follow-up outpatient to further evaluate this.  This seems to be separate from his headache pain which is more frontotemporal.  Head CT is without any  hemorrhage or any other acute abnormal finding.  He is neurologically intact.  He appears somewhat dehydrated on exam and feels improved after gentle hydration here.  Will discharge home with outpatient follow-up.  Return precautions discussed.  Final Clinical Impression(s) / ED Diagnoses Final diagnoses:  Acute nonintractable headache, unspecified headache type  Scalp lesion    Rx / DC Orders ED Discharge Orders     None         Tedd Sias, [place] 08/12/22 0630    Maudie Flakes, MD 08/12/22 0700

## 2022-08-12 NOTE — Discharge Instructions (Signed)
Please follow-up with your primary care doctor regarding the boil/abscess on your scalp.  Warm compresses this area can help.  Tylenol 1000 mg every 6 hours

## 2022-08-14 ENCOUNTER — Ambulatory Visit: Payer: Self-pay | Admitting: Family Medicine

## 2022-08-14 ENCOUNTER — Inpatient Hospital Stay: Payer: Self-pay | Admitting: Family Medicine

## 2022-08-19 ENCOUNTER — Other Ambulatory Visit: Payer: Self-pay

## 2022-08-21 ENCOUNTER — Other Ambulatory Visit: Payer: Self-pay

## 2022-08-21 ENCOUNTER — Emergency Department (HOSPITAL_COMMUNITY)
Admission: EM | Admit: 2022-08-21 | Discharge: 2022-08-22 | Disposition: A | Discharge status: Home / Self Care | Payer: Self-pay | Attending: Emergency Medicine | Admitting: Emergency Medicine

## 2022-08-21 DIAGNOSIS — L0291 Cutaneous abscess, unspecified: Secondary | ICD-10-CM

## 2022-08-21 DIAGNOSIS — E1122 Type 2 diabetes mellitus with diabetic chronic kidney disease: Secondary | ICD-10-CM | POA: Insufficient documentation

## 2022-08-21 DIAGNOSIS — Z992 Dependence on renal dialysis: Secondary | ICD-10-CM | POA: Insufficient documentation

## 2022-08-21 DIAGNOSIS — N186 End stage renal disease: Secondary | ICD-10-CM | POA: Insufficient documentation

## 2022-08-21 DIAGNOSIS — Z794 Long term (current) use of insulin: Secondary | ICD-10-CM | POA: Insufficient documentation

## 2022-08-21 DIAGNOSIS — Z79899 Other long term (current) drug therapy: Secondary | ICD-10-CM | POA: Insufficient documentation

## 2022-08-21 DIAGNOSIS — L02811 Cutaneous abscess of head [any part, except face]: Secondary | ICD-10-CM | POA: Insufficient documentation

## 2022-08-21 DIAGNOSIS — Z21 Asymptomatic human immunodeficiency virus [HIV] infection status: Secondary | ICD-10-CM | POA: Insufficient documentation

## 2022-08-21 DIAGNOSIS — I120 Hypertensive chronic kidney disease with stage 5 chronic kidney disease or end stage renal disease: Secondary | ICD-10-CM | POA: Insufficient documentation

## 2022-08-21 DIAGNOSIS — R519 Headache, unspecified: Secondary | ICD-10-CM

## 2022-08-21 LAB — CBC WITH DIFFERENTIAL/PLATELET
Abs Immature Granulocytes: 0.02 10*3/uL (ref 0.00–0.07)
Basophils Absolute: 0.1 10*3/uL (ref 0.0–0.1)
Basophils Relative: 1 %
Eosinophils Absolute: 0.3 10*3/uL (ref 0.0–0.5)
Eosinophils Relative: 4 %
HCT: 36.4 % — ABNORMAL LOW (ref 39.0–52.0)
Hemoglobin: 11.6 g/dL — ABNORMAL LOW (ref 13.0–17.0)
Immature Granulocytes: 0 %
Lymphocytes Relative: 21 %
Lymphs Abs: 1.6 10*3/uL (ref 0.7–4.0)
MCH: 32.5 pg (ref 26.0–34.0)
MCHC: 31.9 g/dL (ref 30.0–36.0)
MCV: 102 fL — ABNORMAL HIGH (ref 80.0–100.0)
Monocytes Absolute: 0.6 10*3/uL (ref 0.1–1.0)
Monocytes Relative: 8 %
Neutro Abs: 5.3 10*3/uL (ref 1.7–7.7)
Neutrophils Relative %: 66 %
Platelets: 294 10*3/uL (ref 150–400)
RBC: 3.57 MIL/uL — ABNORMAL LOW (ref 4.22–5.81)
RDW: 14.2 % (ref 11.5–15.5)
WBC: 7.9 10*3/uL (ref 4.0–10.5)
nRBC: 0 % (ref 0.0–0.2)

## 2022-08-21 LAB — BASIC METABOLIC PANEL
Anion gap: 11 (ref 5–15)
BUN: 13 mg/dL (ref 6–20)
CO2: 31 mmol/L (ref 22–32)
Calcium: 8.7 mg/dL — ABNORMAL LOW (ref 8.9–10.3)
Chloride: 97 mmol/L — ABNORMAL LOW (ref 98–111)
Creatinine, Ser: 5.04 mg/dL — ABNORMAL HIGH (ref 0.61–1.24)
GFR, Estimated: 13 mL/min — ABNORMAL LOW (ref >60)
Glucose, Bld: 243 mg/dL — ABNORMAL HIGH (ref 70–99)
Potassium: 4.6 mmol/L (ref 3.5–5.1)
Sodium: 139 mmol/L (ref 135–145)

## 2022-08-21 NOTE — ED Provider Triage Note (Signed)
Emergency Medicine Provider Triage Evaluation Note  Alvin Daniels , a 52 y.o. male  was evaluated in triage.  Pt complains of headache x 1 month.  Seen recently for same complaint.  Unremarkable CT head.  Has abscess on top of scalp.  No fever.  Review of Systems  Positive: headache Negative: fever  Physical Exam  BP 111/70 (BP Location: Right Arm)   Pulse 88   Temp 98.5 F (36.9 C) (Oral)   Resp 16   SpO2 98%  Gen:   Awake, no distress   Resp:  Normal effort  MSK:   Moves extremities without difficulty  Other:    Medical Decision Making  Medically screening exam initiated at 7:28 PM.  Appropriate orders placed.  Alvin Daniels was informed that the remainder of the evaluation will be completed by another provider, this initial triage assessment does not replace that evaluation, and the importance of remaining in the ED until their evaluation is complete.  Labs CT head on 3/31 unremarkable for intracranial abnormalities.   Mannie Stabile, [place] 08/21/22 1929

## 2022-08-21 NOTE — ED Triage Notes (Signed)
C/o headache x 1 month with vomiting and light sensitivity.   Abscess noted to top of scalp.  Seen on 3/31 for same.  Denies fever.

## 2022-08-22 ENCOUNTER — Encounter (HOSPITAL_COMMUNITY): Payer: Self-pay

## 2022-08-22 MED ORDER — METOCLOPRAMIDE HCL 5 MG/ML IJ SOLN
10.0000 mg | Freq: Once | INTRAMUSCULAR | Status: AC
  Administered 2022-08-22: 10 mg via INTRAVENOUS
  Filled 2022-08-22: qty 2

## 2022-08-22 MED ORDER — LIDOCAINE HCL (PF) 1 % IJ SOLN
5.0000 mL | Freq: Once | INTRAMUSCULAR | Status: AC
  Administered 2022-08-22: 5 mL
  Filled 2022-08-22: qty 30

## 2022-08-22 MED ORDER — SODIUM CHLORIDE 0.9 % IV BOLUS
500.0000 mL | Freq: Once | INTRAVENOUS | Status: AC
  Administered 2022-08-22: 500 mL via INTRAVENOUS

## 2022-08-22 MED ORDER — DIPHENHYDRAMINE HCL 25 MG PO CAPS
25.0000 mg | ORAL_CAPSULE | Freq: Once | ORAL | Status: AC
  Administered 2022-08-22: 25 mg via ORAL
  Filled 2022-08-22: qty 1

## 2022-08-22 MED ORDER — DOXYCYCLINE HYCLATE 100 MG PO CAPS: 100.0000 mg | ORAL_CAPSULE | Freq: Two times a day (BID) | ORAL | 0 refills | Status: DC

## 2022-08-22 NOTE — ED Notes (Signed)
I called patient for vitals and no one responded 

## 2022-08-22 NOTE — ED Notes (Signed)
I&D tray at bedside.

## 2022-08-22 NOTE — Discharge Instructions (Signed)
You have an abscess on your head, which I have drained, I would like you to start placing warm compresses over the area twice a day, and then rinse out the wound and apply new dressings 2.  Please start the antibiotics as prescribed to you.  Follow-up with your primary care doctor in 5 days time for reassessment  Headaches please follow-up with neurology for further evaluation call their office for further evaluation  Come back to the emergency department if you develop chest pain, shortness of breath, severe abdominal pain, uncontrolled nausea, vomiting, diarrhea.

## 2022-08-22 NOTE — ED Provider Notes (Signed)
[facility]   CSN: [id number] Arrival date & time: 08/21/22  [id number]     History  Chief Complaint  Patient presents with   Headache    Alvin Daniels is a 52 y.o. male.  HPI   Patient with medical history including end-stage renal disease dialysis Monday Wednesday Friday, HIV, diabetes, hypertension, bilateral above-the-knee amputation, presenting with complaints of a headache.  Patient states headaches been going on for last week, states headaches are intermittent, states he feels a pressure like his station the front of his head, no associated change in vision, paresthesias or weakness in the upper extremities, no associate nausea or vomiting, states headache came on gradually, not the worst headache of his life, states it feels like his headache he had a few days ago.  States he has been compliant with his dialysis treatments, has not missed any sessions, does note that he has a abscess on his head, states has been taking penicillin every other day for last 8 days, states has not gotten better.  Reviewed patient's chart seen on March 31, for acute headache, lab work imaging were obtained both which were unremarkable, given migraine cocktail and headache had improved, noted abscess at that time, does not appear it was lanced or started on antibiotics.   Home Medications Prior to Admission medications   Medication Sig Start Date End Date Taking? Authorizing Provider  doxycycline (VIBRAMYCIN) 100 MG capsule Take 1 capsule (100 mg total) by mouth 2 (two) times daily. 08/22/22  Yes Carroll SageFaulkner, Lanaiya Lantry J, PA-C  bictegravir-emtricitabine-tenofovir AF (BIKTARVY) 50-200-25 MG TABS tablet Take 1 tablet by mouth daily. 05/29/22   Randall HissVan Dam, Cornelius N, MD  Blood Glucose Monitoring Suppl (TRUE METRIX METER) w/Device KIT Use kit to check glucose 3 (three) times daily. 12/27/21   Hoy RegisterNewlin, Enobong, MD  brimonidine (ALPHAGAN) 0.2 %  ophthalmic solution Place 1 drop into the left eye 3 (three) times daily. 08/03/22 09/02/22  Lanae BoastKc, Ramesh, MD  dorzolamide (TRUSOPT) 2 % ophthalmic solution Place 1 drop into the left eye 2 (two) times daily. 08/03/22 09/02/22  Lanae BoastKc, Ramesh, MD  erythromycin ophthalmic ointment Place into the left eye 2 (two) times daily. 08/03/22   Lanae BoastKc, Ramesh, MD  glucose blood (TRUE METRIX BLOOD GLUCOSE TEST) test strip use 1 test strip to check glucose 3 (three) times daily. 12/27/21   Hoy RegisterNewlin, Enobong, MD  insulin NPH-regular Human (HUMULIN 70/30) (70-30) 100 UNIT/ML injection Inject 20 units subcutaneously twice daily with meals 06/26/22   Georganna SkeansWilson, Amelia, MD  insulin NPH-regular Human (HUMULIN 70/30) (70-30) 100 UNIT/ML injection Inject 15 unit subcutaneously twice a day as directed before meals 08/12/22     Insulin Syringe-Needle U-100 (BD INSULIN SYRINGE ULTRAFINE) 31G X 15/64" 0.5 ML MISC 1 each by Does not apply route 2 (two) times daily. 02/10/20   Hoy RegisterNewlin, Enobong, MD  midodrine (PROAMATINE) 10 MG tablet Take 1 tablet (10 mg total) by mouth 3 (three) times daily with meals. In Spanish 01/02/22   Daiva EvesVan Dam, Lisette Grinderornelius N, MD  mupirocin ointment (BACTROBAN) 2 % Place 1 Application into the nose 2 (two) times daily. 08/03/22   Lanae BoastKc, Ramesh, MD  Pitavastatin Magnesium 4 MG TABS Take 4 mg by mouth daily. Patient not taking: Reported on 08/01/2022 05/29/22   Daiva EvesVan Dam, Lisette Grinderornelius N, MD  prednisoLONE acetate (PRED FORTE) 1 % ophthalmic suspension Place 1 drop into the left eye 3 (three) times daily. 08/03/22   Lanae BoastKc, Ramesh, MD  timolol (TIMOPTIC)  0.5 % ophthalmic solution Place 1 drop into the left eye 2 (two) times daily. 08/03/22 09/02/22  Lanae Boast, MD  TRUEplus Lancets 28G MISC Use to test 3 (three) times daily. 12/27/21   Hoy Register, MD  bictegravir-emtricitabine-tenofovir AF (BIKTARVY) 50-200-25 MG TABS tablet Take 1 tablet by mouth daily. 01/02/22   Randall Hiss, MD      Allergies    Patient has no known allergies.     Review of Systems   Review of Systems  Constitutional:  Negative for chills and fever.  Respiratory:  Negative for shortness of breath.   Cardiovascular:  Negative for chest pain.  Gastrointestinal:  Negative for abdominal pain.  Skin:  Positive for wound.  Neurological:  Positive for headaches.    Physical Exam Updated Vital Signs BP 132/83   Pulse 79   Temp 97.9 F (36.6 C)   Resp 16   SpO2 98%  Physical Exam Vitals and nursing note reviewed.  Constitutional:      General: He is not in acute distress.    Appearance: He is not ill-appearing.  HENT:     Head: Normocephalic and atraumatic.     Comments: Patient has a noted abscess on the right parietal lobe, about the size of a golf ball, erythema, with a scab over on top, no drainage or discharge, fluctuance present.  No other gross deformities present please see picture for full detail    Nose: No congestion.     Mouth/Throat:     Mouth: Mucous membranes are moist.     Pharynx: Oropharynx is clear.  Eyes:     Conjunctiva/sclera: Conjunctivae normal.     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  Cardiovascular:     Rate and Rhythm: Normal rate and regular rhythm.     Pulses: Normal pulses.     Heart sounds: No murmur heard.    No friction rub. No gallop.  Pulmonary:     Effort: No respiratory distress.     Breath sounds: No wheezing, rhonchi or rales.  Musculoskeletal:     Comments: Bilateral above-the-knee amputee  Skin:    General: Skin is warm and dry.  Neurological:     Mental Status: He is alert.     Comments: No facial asymmetry no difficulty with word finding following two-step commands no unilateral weakness present  Psychiatric:        Mood and Affect: Mood normal.     ED Results / Procedures / Treatments   Labs (all labs ordered are listed, but only abnormal results are displayed) Labs Reviewed  CBC WITH DIFFERENTIAL/PLATELET - Abnormal; Notable  for the following components:      Result Value   RBC 3.57 (*)    Hemoglobin 11.6 (*)    HCT 36.4 (*)    MCV 102.0 (*)    All other components within normal limits  BASIC METABOLIC PANEL - Abnormal; Notable for the following components:   Chloride 97 (*)    Glucose, Bld 243 (*)    Creatinine, Ser 5.04 (*)    Calcium 8.7 (*)    GFR, Estimated 13 (*)    All other components within normal limits    EKG None  Radiology No results found.  Procedures .Marland KitchenIncision and Drainage  Date/Time: 08/22/2022 4:20 AM  Performed by: Carroll Sage, PA-C Authorized by: Carroll Sage, PA-C   Consent:    Consent obtained:  Verbal  Consent given by:  Patient   Risks discussed:  Bleeding, incomplete drainage, pain, damage to other organs and infection   Alternatives discussed:  No treatment Location:    Type:  Abscess   Size:  2cm   Location:  Head   Head location:  Scalp Pre-procedure details:    Skin preparation:  Povidone-iodine Sedation:    Sedation type:  None Anesthesia:    Anesthesia method:  Local infiltration   Local anesthetic:  Lidocaine 1% w/o epi Procedure type:    Complexity:  Simple Procedure details:    Ultrasound guidance: no     Needle aspiration: no     Incision types:  Single straight   Incision depth:  Subcutaneous   Wound management:  Probed and deloculated and irrigated with saline   Drainage:  Bloody, purulent and serous   Drainage amount:  Moderate   Wound treatment:  Wound left open Post-procedure details:    Procedure completion:  Tolerated well, no immediate complications     Medications Ordered in ED Medications  metoCLOPramide (REGLAN) injection 10 mg (10 mg Intravenous Given 08/22/22 0314)  diphenhydrAMINE (BENADRYL) capsule 25 mg (25 mg Oral Given 08/22/22 0315)  sodium chloride 0.9 % bolus 500 mL (0 mLs Intravenous Stopped 08/22/22 0425)  lidocaine (PF) (XYLOCAINE) 1 % injection 5 mL (5 mLs Infiltration Given 08/22/22 0315)    ED  Course/ Medical Decision Making/ A&P                             Medical Decision Making Risk Prescription drug management.   This patient presents to the ED for concern of headache, this involves an extensive number of treatment options, and is a complaint that carries with it a high risk of complications and morbidity.  The differential diagnosis includes intracranial bleed, CVA, meningitis    Additional history obtained:  Additional history obtained from N/A External records from outside source obtained and reviewed including recent ER notes   Co morbidities that complicate the patient evaluation  Dialysis  Social Determinants of Health:  None English speaking    Lab Tests:  I Ordered, and personally interpreted labs.  The pertinent results include: CBC shows normocytic anemia hemoglobin 11.6, BMP shows glucose of 243, creatinine 5, calcium 8.7, creatinine 13   Imaging Studies ordered:  I ordered imaging studies including N/A I independently visualized and interpreted imaging which showed N/A I agree with the radiologist interpretation   Cardiac Monitoring:  The patient was maintained on a cardiac monitor.  I personally viewed and interpreted the cardiac monitored which showed an underlying rhythm of: N/A   Medicines ordered and prescription drug management:  I ordered medication including migraine cocktail I have reviewed the patients home medicines and have made adjustments as needed  Critical Interventions:  N/A   Reevaluation:  Presents with headache, triage obtain lab work which I personally reviewed is unremarkable, patient a benign physical exam, I suspect headache is likely multifactorial migraine as well as headache from the large abscess on his head, will provide a migraine cocktail and I&D abscess.  Reassessed the patient resting comfortably, states he feels much better, has no more complaints about his headache, and is agreement discharge at  this time.   Consultations Obtained:  N/A   Test Considered:  CT head-deferred as my suspicion for intracranial bleed is low at this time, not on anticoag's, no recent head trauma, recent had CT head performed 12 days  ago which was negative.    Rule out Low suspicion for CVA she has no focal deficit present my exam.  Low suspicion for dissection of the vertebral or carotid artery as presentation atypical of etiology.  Low suspicion for meningitis as she has no meningeal sign present.     Dispostion and problem list  After consideration of the diagnostic results and the patients response to treatment, I feel that the patent would benefit from discharge.  Headache-suspect multifactorial, possible migraine as well as tension-like headache from the large abscess on his scalp, will have him follow-up with neurology for further evaluation Abscess-I&D, will start antibiotics, follow-up with his PCP for reassessment.  Strict return precautions.            Final Clinical Impression(s) / ED Diagnoses Final diagnoses:  Bad headache  Abscess    Rx / DC Orders ED Discharge Orders          Ordered    doxycycline (VIBRAMYCIN) 100 MG capsule  2 times daily        08/22/22 0520              Carroll Sage, PA-C 08/22/22 1610    Gilda Crease, MD 08/22/22 [phone]

## 2022-08-29 ENCOUNTER — Other Ambulatory Visit: Payer: Self-pay

## 2022-09-02 ENCOUNTER — Other Ambulatory Visit: Payer: Self-pay

## 2022-09-03 ENCOUNTER — Other Ambulatory Visit: Payer: Self-pay

## 2022-09-16 ENCOUNTER — Other Ambulatory Visit: Payer: Self-pay

## 2022-09-17 ENCOUNTER — Other Ambulatory Visit: Payer: Self-pay

## 2022-09-24 ENCOUNTER — Ambulatory Visit: Payer: Self-pay | Admitting: Family Medicine

## 2022-09-25 ENCOUNTER — Ambulatory Visit (INDEPENDENT_AMBULATORY_CARE_PROVIDER_SITE_OTHER): Payer: Self-pay | Admitting: Family Medicine

## 2022-09-25 ENCOUNTER — Encounter: Payer: Self-pay | Admitting: Family Medicine

## 2022-09-25 VITALS — BP 150/86 | HR 77 | Temp 98.1°F | Resp 16 | Wt 111.1 lb

## 2022-09-25 DIAGNOSIS — I10 Essential (primary) hypertension: Secondary | ICD-10-CM

## 2022-09-25 DIAGNOSIS — N186 End stage renal disease: Secondary | ICD-10-CM

## 2022-09-25 DIAGNOSIS — Z794 Long term (current) use of insulin: Secondary | ICD-10-CM

## 2022-09-25 DIAGNOSIS — Z789 Other specified health status: Secondary | ICD-10-CM

## 2022-09-25 DIAGNOSIS — E1122 Type 2 diabetes mellitus with diabetic chronic kidney disease: Secondary | ICD-10-CM

## 2022-09-25 DIAGNOSIS — B20 Human immunodeficiency virus [HIV] disease: Secondary | ICD-10-CM

## 2022-09-25 MED ORDER — HUMULIN 70/30 (70-30) 100 UNIT/ML ~~LOC~~ SUSP: SUBCUTANEOUS | 5 refills | Status: DC

## 2022-09-25 MED ORDER — TRUE METRIX BLOOD GLUCOSE TEST VI STRP: 1.0000 | ORAL_STRIP | Freq: Three times a day (TID) | 2 refills | Status: DC

## 2022-09-25 MED ORDER — "INSULIN SYRINGE-NEEDLE U-100 31G X 15/64"" 0.5 ML MISC": 1.0000 | Freq: Two times a day (BID) | 5 refills | Status: DC

## 2022-10-01 ENCOUNTER — Encounter: Payer: Self-pay | Admitting: Family Medicine

## 2022-10-01 NOTE — Progress Notes (Signed)
Established Patient Office Visit  Subjective    Patient ID: Alvin Daniels, male    DOB: 30-Mar-1971  Age: 52 y.o. MRN: [id number]  CC: No chief complaint on file.   HPI Alvin Daniels presents for routine follow up of chronic med issues. Patient denies acute complaints or concerns. This visit was aided by an interpreter.    Outpatient Encounter Medications as of 09/25/2022  Medication Sig   bictegravir-emtricitabine-tenofovir AF (BIKTARVY) 50-200-25 MG TABS tablet Take 1 tablet by mouth daily.   Blood Glucose Monitoring Suppl (TRUE METRIX METER) w/Device KIT Use kit to check glucose 3 (three) times daily.   brimonidine (ALPHAGAN) 0.2 % ophthalmic solution Place 1 drop into the left eye 3 (three) times daily.   dorzolamide (TRUSOPT) 2 % ophthalmic solution Place 1 drop into the left eye 2 (two) times daily.   doxycycline (VIBRAMYCIN) 100 MG capsule Take 1 capsule (100 mg total) by mouth 2 (two) times daily.   erythromycin ophthalmic ointment Place into the left eye 2 (two) times daily.   glucose blood (TRUE METRIX BLOOD GLUCOSE TEST) test strip use 1 test strip to check glucose 3 (three) times daily.   insulin NPH-regular Human (HUMULIN 70/30) (70-30) 100 UNIT/ML injection Inject 20 units subcutaneously twice daily with meals   insulin NPH-regular Human (HUMULIN 70/30) (70-30) 100 UNIT/ML injection Inject 15 unit subcutaneously twice a day as directed before meals   Insulin Syringe-Needle U-100 (BD INSULIN SYRINGE ULTRAFINE) 31G X 15/64" 0.5 ML MISC 1 each by Does not apply route 2 (two) times daily.   midodrine (PROAMATINE) 10 MG tablet Take 1 tablet (10 mg total) by mouth 3 (three) times daily with meals. In Spanish   mupirocin ointment (BACTROBAN) 2 % Place 1 Application into the nose 2 (two) times daily.   Pitavastatin Magnesium 4 MG TABS Take 4 mg by mouth daily. (Patient not taking: Reported on 08/01/2022)   prednisoLONE acetate (PRED FORTE) 1 %  ophthalmic suspension Place 1 drop into the left eye 3 (three) times daily.   timolol (TIMOPTIC) 0.5 % ophthalmic solution Place 1 drop into the left eye 2 (two) times daily.   TRUEplus Lancets 28G MISC Use to test 3 (three) times daily.   [DISCONTINUED] bictegravir-emtricitabine-tenofovir AF (BIKTARVY) 50-200-25 MG TABS tablet Take 1 tablet by mouth daily.   [DISCONTINUED] glucose blood (TRUE METRIX BLOOD GLUCOSE TEST) test strip use 1 test strip to check glucose 3 (three) times daily.   [DISCONTINUED] insulin NPH-regular Human (HUMULIN 70/30) (70-30) 100 UNIT/ML injection Inject 20 units subcutaneously twice daily with meals   [DISCONTINUED] insulin NPH-regular Human (HUMULIN 70/30) (70-30) 100 UNIT/ML injection Inject 15 unit subcutaneously twice a day as directed before meals   [DISCONTINUED] Insulin Syringe-Needle U-100 (BD INSULIN SYRINGE ULTRAFINE) 31G X 15/64" 0.5 ML MISC 1 each by Does not apply route 2 (two) times daily.   No facility-administered encounter medications on file as of 09/25/2022.    Past Medical History:  Diagnosis Date   Acute bilateral low back pain 01/03/2021   AIDS ([name]) 11/22/2014   Back pain 06/09/2019   BKA stump complication ([name]) 06/09/2019   Chronic diarrhea    Chronic hepatitis C without hepatic coma ([name]) 11/22/2014   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[name])    Right   Diabetic neuropathy ([name])    ESRD (end stage renal disease) on dialysis ([name])    "TTS; don't remember street name" (05/03/2014)   Hepatitis C    HIV INFECTION 06/27/2010   Qualifier:  Diagnosis of  By: Jennye Moccasin CMA ( AAMA), Jacqueline     Hypotension 06/02/2012   Metabolic bone disease 06/18/2012   MRSA infection    Normocytic anemia 06/17/2012   Pancreatitis    Pressure ulcer of BKA stump, stage 2 ([name]) 11/22/2014   Renal disorder    Renal insufficiency    Severe protein-calorie malnutrition ([name]) 06/17/2012   Uncontrolled diabetes mellitus with complications 06/27/2010    Annotation: uncontrolled Qualifier: Diagnosis of  By: Jennye Moccasin CMA Duncan Dull), Jacqueline     Vaccine counseling 05/29/2022    Past Surgical History:  Procedure Laterality Date   AMPUTATION Left 04/20/2014   Procedure: 3rd toe amputation, 4th Toe Amputation,  5th Toe Amputation;  Surgeon: Nadara Mustard, MD;  Location: [facility] OR;  Service: Orthopedics;  Laterality: Left;   AMPUTATION Left 05/02/2014   Procedure: Midfoot Amputation;  Surgeon: Nadara Mustard, MD;  Location[facility] OR;  Service: Orthopedics;  Laterality: Left;   AMPUTATION Left 06/17/2014   Procedure: AMPUTATION BELOW KNEE;  Surgeon: Nadara Mustard, MD;  Location: [facility] OR;  Service: Orthopedics;  Laterality: Left;   AMPUTATION Right 09/04/2018   Procedure: RIGHT BELOW KNEE AMPUTATION;  Surgeon: Nadara Mustard, MD;  Location[facility] OR;  Service: Orthopedics;  Laterality: Right;  RIGHT BELOW KNEE AMPUTATION   AV FISTULA PLACEMENT Left    AV FISTULA PLACEMENT Left 05/10/2016   Procedure: Creation Left Arm Brachiocephalic Arteriovenous Fistula and Ligation of Radiocephalic Fistula;  Surgeon: Chuck Hint, MD;  Location: [facility] OR;  Service: Vascular;  Laterality: Left;   FEMUR IM NAIL Left 08/17/2016   Procedure: INTRAMEDULLARY (IM) RETROGRADE FEMORAL NAILING;  Surgeon: Eldred Manges, MD;  Location: [facility] OR;  Service: Orthopedics;  Laterality: Left;   FOOT AMPUTATION THROUGH ANKLE Left 12/'21/2015   midfoot   IR GENERIC HISTORICAL Left 05/01/2016   IR THROMBECTOMY AV FISTULA W/THROMBOLYSIS/PTA INC/SHUNT/IMG LEFT 05/01/2016 Oley Balm, MD [facility]   IR GENERIC HISTORICAL  05/01/2016   IR US GUIDE VASC ACCESS LEFT 05/01/2016 Oley Balm, MD [facility]   IR GENERIC HISTORICAL  05/07/2016   IR FLUORO GUIDE CV LINE RIGHT 05/07/2016 Gilmer Mor, DO [facility]   IR GENERIC HISTORICAL  05/07/2016   IR US GUIDE VASC ACCESS RIGHT 05/07/2016 Gilmer Mor, DO [facility]   IR GENERIC HISTORICAL  05/22/2016   IR US GUIDE VASC ACCESS RIGHT  05/22/2016 Berdine Dance, MD [facility]   IR GENERIC HISTORICAL  05/22/2016   IR FLUORO GUIDE CV LINE RIGHT 05/22/2016 Berdine Dance, MD [facility]   IR REMOVAL TUN CV CATH W/O [facility]  08/21/2016   PERIPHERAL VASCULAR CATHETERIZATION Left 05/09/2016   Procedure: A/V Fistulagram;  Surgeon: Chuck Hint, MD;  [facility] INVAS[facility] CV LAB;  Service: Cardiovascular;  Laterality: Left;  arm   TEE WITHOUT CARDIOVERSION N/A 06/22/2018   Procedure: TRANSESOPHAGEAL ECHOCARDIOGRAM (TEE);  Surgeon: Jake Bathe, MD;  [facility] END[facility];  Service: Cardiovascular;  Laterality: N/A;    Family History  Problem Relation Age of Onset   Diabetes Mother    Diabetes Father     Social History   Socioeconomic History   Marital status: Single    Spouse name: Not on file   Number of children: Not on file   Years of education: Not on file   Highest education level: Not on file  Occupational History   Not on file  Tobacco Use   Smoking status: Never   Smokeless tobacco: Never  Vaping Use  Vaping Use: Never used  Substance and Sexual Activity   Alcohol use: No   Drug use: No   Sexual activity: Not Currently    Comment: declined condoms  Other Topics Concern   Not on file  Social History Narrative   ** Merged History Encounter **       Social Determinants of Health   Financial Resource Strain: Not on file  Food Insecurity: Not on file  Transportation Needs: Not on file  Physical Activity: Not on file  Stress: Not on file  Social Connections: Not on file  Intimate Partner Violence: Not on file    Review of Systems  All other systems reviewed and are negative.       Objective    BP (!) 150/86   Pulse 77   Temp 98.1 F (36.7 C) (Oral)   Resp 16   Wt 111 lb 1.6 oz (50.4 kg) Comment: 2 legs  SpO2 97%   BMI 20.32 kg/m   Physical Exam Vitals and nursing note reviewed.  Constitutional:      General: He is not in acute distress. Cardiovascular:     Rate and  Rhythm: Normal rate and regular rhythm.  Pulmonary:     Effort: Pulmonary effort is normal.     Breath sounds: Normal breath sounds.  Abdominal:     Palpations: Abdomen is soft.     Tenderness: There is no abdominal tenderness.  Musculoskeletal:     Comments: Utilizing walker     Right Lower Extremity: Right leg is amputated below knee.     Left Lower Extremity: Left leg is amputated below knee.  Neurological:     General: No focal deficit present.     Mental Status: He is alert and oriented to person, place, and time.         Assessment & Plan:   1. 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 Discussed compliance. Continue  - glucose blood (TRUE METRIX BLOOD GLUCOSE TEST) test strip; use 1 test strip to check glucose 3 (three) times daily.  Dispense: 100 each; Refill: 2 - Insulin Syringe-Needle U-100 (BD INSULIN SYRINGE ULTRAFINE) 31G X 15/64" 0.5 ML MISC; 1 each by Does not apply route 2 (two) times daily.  Dispense: 100 each; Refill: 5  2. Essential hypertension Slightly elevated readings. Continue   3. HIV infection, unspecified symptom status ([name]) Management as per consultant  4. Language barrier to communication     No follow-ups on file.   Tommie Raymond, MD

## 2022-11-01 ENCOUNTER — Other Ambulatory Visit: Payer: Self-pay | Admitting: Family Medicine

## 2022-11-01 ENCOUNTER — Other Ambulatory Visit (HOSPITAL_COMMUNITY): Payer: Self-pay

## 2022-11-01 MED ORDER — NOVOLOG 70/30 FLEXPEN RELION (70-30) 100 UNIT/ML ~~LOC~~ SUPN: 20.0000 [IU] | PEN_INJECTOR | Freq: Two times a day (BID) | SUBCUTANEOUS | 11 refills | Status: DC

## 2022-11-01 MED ORDER — INSULIN PEN NEEDLE 31G X 5 MM MISC: 5 refills | Status: DC

## 2022-11-01 NOTE — Telephone Encounter (Signed)
Medication Refill - Medication: brimonidine (ALPHAGAN) 0.2 % ophthalmic solution , dorzolamide (TRUSOPT) 2 % ophthalmic solution  erythromycin ophthalmic ointment , prednisoLONE acetate (PRED FORTE) 1 % ophthalmic suspension   Has the patient contacted their pharmacy? Yes.    (Agent: If yes, when and what did the pharmacy advise?)  Preferred Pharmacy (with phone number or street name):  [facility]  [address], [address] [place] [address]  Phone: [phone] Fax: [phone]  Hours: M-F 7:30a-6:00p   Has the patient been seen for an appointment in the last year OR does the patient have an upcoming appointment? Yes.    Agent: Please be advised that RX refills may take up to 3 business days. We ask that you follow-up with your pharmacy.

## 2022-11-04 NOTE — Telephone Encounter (Signed)
Requested medications are due for refill today.  unsure  Requested medications are on the active medications list.  yes  Last refill. 08/03/2022 for all 4  Future visit scheduled.   yes  Notes to clinic.  Rx's signed by Lanae Boast.    Requested Prescriptions  Pending Prescriptions Disp Refills   brimonidine (ALPHAGAN) 0.2 % ophthalmic solution 10 mL 0    Sig: Place 1 drop into the left eye 3 (three) times daily.     Ophthalmology:  Glaucoma Passed - 11/01/2022  4:02 PM      Passed - Valid encounter within last 12 months    Recent Outpatient Visits           1 month ago Essential hypertension   [place] [facility], Lauris Poag, MD   4 months ago Type 2 diabetes mellitus with other specified complication, with long-term current use of insulin [facility])   [place] [facility] at [facility], Lauris Poag, MD   7 months ago Type 2 diabetes mellitus with other specified complication, with long-term current use of insulin [facility])   [place] [facility] at [facility], Lauris Poag, MD   10 months ago Diabetes mellitus with ESRD (end-stage renal disease) [facility])   [place] [facility] [place], Jeannett Senior L, RPH-CPP   11 months ago Type 2 diabetes mellitus with other specified complication, with long-term current use of insulin [facility])   [place] [facility] at [facility], MD       Future Appointments             In 1 month Daiva Eves, Lisette Grinder, MD [facility] for Infectious Disease, [facility]   In 2 months Georganna Skeans, MD [facility]             dorzolamide (TRUSOPT) 2 % ophthalmic solution 10 mL 0    Sig: Place 1 drop into the left eye 2 (two) times daily.     Ophthalmology:  Glaucoma 3 Failed - 11/01/2022  4:02 PM      Failed - Cr in normal range and within 360 days    Creat  Date Value Ref Range Status  06/06/2021 4.88 (H) 0.70 - 1.30 mg/dL Final     Comment:    Verified by repeat analysis. .    Creatinine, Ser  Date Value Ref Range Status  08/21/2022 5.04 (H) 0.61 - 1.24 mg/dL Final   Creatinine, Urine  Date Value Ref Range Status  12/04/2009 42.0 mg/dL Final         Failed - eGFR is 30 or above and within 360 days    GFR, Est African American  Date Value Ref Range Status  05/15/2017 11 (L) > OR = 60 mL/min/1.60m2 Final   GFR calc Af Amer  Date Value Ref Range Status  12/10/2019 6 (L) >60 mL/min Final   GFR, Est Non African American  Date Value Ref Range Status  05/15/2017 10 (L) > OR = 60 mL/min/1.27m2 Final   GFR, Estimated  Date Value Ref Range Status  08/21/2022 13 (L) >60 mL/min Final    Comment:    (NOTE) Calculated using the CKD-EPI Creatinine Equation (2021)    GFR  Date Value Ref Range Status  07/10/2011 10.90 (LL) >60.00 mL/min Final   eGFR  Date Value Ref Range Status  06/06/2021 14 (L) > OR = 60 mL/min/1.38m2 Final  Comment:    The eGFR is based on the CKD-EPI 2021 equation. To calculate  the new eGFR from a previous Creatinine or Cystatin C result, go to https://www.kidney.org/professionals/ kdoqi/gfr%5Fcalculator          Passed - Valid encounter within last 12 months    Recent Outpatient Visits           1 month ago Essential hypertension   [place] [facility], Lauris Poag, MD   4 months ago Type 2 diabetes mellitus with other specified complication, with long-term current use of insulin [facility])   [place] [facility], Lauris Poag, MD   7 months ago Type 2 diabetes mellitus with other specified complication, with long-term current use of insulin [facility])   [place] [facility], Lauris Poag, MD   10 months ago Diabetes mellitus with ESRD (end-stage renal disease) [facility])   [place] [facility] [place], Jeannett Senior L, RPH-CPP   11 months ago Type 2 diabetes mellitus with other specified  complication, with long-term current use of insulin [name])   [place] [facility], MD       Future Appointments             In 1 month Daiva Eves, Lisette Grinder, MD [facility] for Infectious Disease, [facility]   In 2 months Georganna Skeans, MD [facility] at [facility]             erythromycin ophthalmic ointment 3.5 g 0    Sig: Place into the left eye 2 (two) times daily.     Off-Protocol Failed - 11/01/2022  4:02 PM      Failed - Medication not assigned to a protocol, review manually.      Passed - Valid encounter within last 12 months    Recent Outpatient Visits           1 month ago Essential hypertension   [place] [facility], Lauris Poag, MD   4 months ago Type 2 diabetes mellitus with other specified complication, with long-term current use of insulin [facility])   [place] [facility] at [facility], Lauris Poag, MD   7 months ago Type 2 diabetes mellitus with other specified complication, with long-term current use of insulin [facility])   [place] [facility] at [facility], Lauris Poag, MD   10 months ago Diabetes mellitus with ESRD (end-stage renal disease) [facility])   [place] [facility] [place], Jeannett Senior L, RPH-CPP   11 months ago Type 2 diabetes mellitus with other specified complication, with long-term current use of insulin [facility])    Primary Care at [facility], MD       Future Appointments             In 1 month Daiva Eves, Lisette Grinder, MD [facility] for [facility], [facility]   In 2 months Georganna Skeans, MD [facility] at [facility]             prednisoLONE acetate (PRED FORTE) 1 % ophthalmic suspension 5 mL 0    Sig: Place 1 drop into the left eye 3 (three) times daily.     Off-Protocol Failed - 11/01/2022  4:02 PM      Failed - Medication not assigned to a protocol,  review manually.  Passed - Valid encounter within last 12 months    Recent Outpatient Visits           1 month ago Essential hypertension   [place] [facility] at [facility], Lauris Poag, MD   4 months ago Type 2 diabetes mellitus with other specified complication, with long-term current use of insulin [facility])   [place] [facility] at [facility], Lauris Poag, MD   7 months ago Type 2 diabetes mellitus with other specified complication, with long-term current use of insulin [facility])   [place] [facility] at [facility], Lauris Poag, MD   10 months ago Diabetes mellitus with ESRD (end-stage renal disease) [facility])   [place] [facility] [place], Cornelius Moras, RPH-CPP   11 months ago Type 2 diabetes mellitus with other specified complication, with long-term current use of insulin [facility])   [place] [facility] at [facility], MD       Future Appointments             In 1 month Daiva Eves, Lisette Grinder, MD [facility] for Infectious Disease, [facility]   In 2 months Georganna Skeans, MD [facility] at [facility]

## 2022-11-08 ENCOUNTER — Other Ambulatory Visit: Payer: Self-pay

## 2022-11-18 ENCOUNTER — Other Ambulatory Visit: Payer: Self-pay

## 2022-11-18 ENCOUNTER — Other Ambulatory Visit: Payer: Self-pay | Admitting: Family Medicine

## 2022-11-18 NOTE — Telephone Encounter (Signed)
Humulin 70/30 with 5 refills prescribed 10/01/2022 by Georganna Skeans, MD.

## 2022-11-19 NOTE — Telephone Encounter (Signed)
Requested medication (s) are due for refill today: na   Requested medication (s) are on the active medication list: yes   Last refill:  09/25/22 15 mo 5 refills for humulin 70/30 20 units  Future visit scheduled: yes in 2 months   Notes to clinic:   please clarify if PCP wants humulin 70/30 20 units twice daily with meals and / or 15 units humulin 70/30 15 units twice daily with meals.?     Requested Prescriptions  Pending Prescriptions Disp Refills   insulin NPH-regular Human (HUMULIN 70/30) (70-30) 100 UNIT/ML injection 20 mL 5    Sig: Inject 20 units subcutaneously twice daily with meals     Endocrinology:  Diabetes - Insulins Failed - 11/18/2022  3:23 PM      Failed - HBA1C is between 0 and 7.9 and within 180 days    HbA1c, POC (controlled diabetic range)  Date Value Ref Range Status  09/28/2018 6.8 0.0 - 7.0 % Final   Hgb A1c MFr Bld  Date Value Ref Range Status  08/01/2022 10.7 (H) 4.8 - 5.6 % Final    Comment:    (NOTE)         Prediabetes: 5.7 - 6.4         Diabetes: >6.4         Glycemic control for adults with diabetes: <7.0          Passed - Valid encounter within last 6 months    Recent Outpatient Visits           1 month ago Essential hypertension   [place] [facility], Lauris Poag, MD   4 months ago Type 2 diabetes mellitus with other specified complication, with long-term current use of insulin ([name])   [place] [facility] at [facility], Lauris Poag, MD   7 months ago Type 2 diabetes mellitus with other specified complication, with long-term current use of insulin [facility])   [place] [facility] at [facility], Lauris Poag, MD   10 months ago Diabetes mellitus with ESRD (end-stage renal disease) [facility])   [place] [facility] [place], [place] L, RPH-CPP   12 months ago Type 2 diabetes mellitus with other specified complication, with long-term current use of insulin [facility])   [place]  [facility], MD       Future Appointments             In 4 weeks Daiva Eves, Lisette Grinder, MD [facility] for [facility]ious Disease, [facility]   In 2 months Georganna Skeans, MD [facility] at [facility]

## 2022-11-19 NOTE — Telephone Encounter (Signed)
Humulin 70/30 with 5 refills prescribed 10/01/2022 by Amelia Wilson, MD.  

## 2022-11-21 ENCOUNTER — Other Ambulatory Visit: Payer: Self-pay

## 2022-11-25 ENCOUNTER — Other Ambulatory Visit: Payer: Self-pay

## 2022-11-25 ENCOUNTER — Other Ambulatory Visit: Payer: Self-pay | Admitting: Family Medicine

## 2022-11-28 ENCOUNTER — Other Ambulatory Visit: Payer: Self-pay

## 2022-12-09 ENCOUNTER — Other Ambulatory Visit: Payer: Self-pay

## 2022-12-09 MED ORDER — ERYTHROMYCIN 5 MG/GM OP OINT
TOPICAL_OINTMENT | OPHTHALMIC | 0 refills | Status: AC
  Filled 2022-12-09: qty 3.5, 10d supply, fill #0

## 2022-12-11 ENCOUNTER — Other Ambulatory Visit: Payer: Self-pay

## 2022-12-11 ENCOUNTER — Encounter (HOSPITAL_COMMUNITY): Payer: Self-pay | Admitting: Emergency Medicine

## 2022-12-11 ENCOUNTER — Emergency Department (HOSPITAL_COMMUNITY)
Admission: EM | Admit: 2022-12-11 | Discharge: 2022-12-11 | Disposition: A | Discharge status: Home / Self Care | Payer: Self-pay | Attending: Emergency Medicine | Admitting: Emergency Medicine

## 2022-12-11 ENCOUNTER — Emergency Department (HOSPITAL_COMMUNITY): Payer: Self-pay

## 2022-12-11 DIAGNOSIS — N186 End stage renal disease: Secondary | ICD-10-CM | POA: Insufficient documentation

## 2022-12-11 DIAGNOSIS — E876 Hypokalemia: Secondary | ICD-10-CM | POA: Insufficient documentation

## 2022-12-11 DIAGNOSIS — R739 Hyperglycemia, unspecified: Secondary | ICD-10-CM | POA: Insufficient documentation

## 2022-12-11 DIAGNOSIS — Z992 Dependence on renal dialysis: Secondary | ICD-10-CM | POA: Insufficient documentation

## 2022-12-11 DIAGNOSIS — H16002 Unspecified corneal ulcer, left eye: Secondary | ICD-10-CM | POA: Insufficient documentation

## 2022-12-11 DIAGNOSIS — Z794 Long term (current) use of insulin: Secondary | ICD-10-CM | POA: Insufficient documentation

## 2022-12-11 LAB — CBC WITH DIFFERENTIAL/PLATELET
Abs Immature Granulocytes: 0.01 10*3/uL (ref 0.00–0.07)
Basophils Absolute: 0.1 10*3/uL (ref 0.0–0.1)
Basophils Relative: 1 %
Eosinophils Absolute: 0.6 10*3/uL — ABNORMAL HIGH (ref 0.0–0.5)
Eosinophils Relative: 9 %
HCT: 33.4 % — ABNORMAL LOW (ref 39.0–52.0)
Hemoglobin: 11.3 g/dL — ABNORMAL LOW (ref 13.0–17.0)
Immature Granulocytes: 0 %
Lymphocytes Relative: 25 %
Lymphs Abs: 1.6 10*3/uL (ref 0.7–4.0)
MCH: 33 pg (ref 26.0–34.0)
MCHC: 33.8 g/dL (ref 30.0–36.0)
MCV: 97.7 fL (ref 80.0–100.0)
Monocytes Absolute: 0.5 10*3/uL (ref 0.1–1.0)
Monocytes Relative: 8 %
Neutro Abs: 3.6 10*3/uL (ref 1.7–7.7)
Neutrophils Relative %: 57 %
Platelets: 208 10*3/uL (ref 150–400)
RBC: 3.42 MIL/uL — ABNORMAL LOW (ref 4.22–5.81)
RDW: 12.5 % (ref 11.5–15.5)
WBC: 6.4 10*3/uL (ref 4.0–10.5)
nRBC: 0 % (ref 0.0–0.2)

## 2022-12-11 LAB — COMPREHENSIVE METABOLIC PANEL
ALT: 23 U/L (ref 0–44)
AST: 34 U/L (ref 15–41)
Albumin: 3.5 g/dL (ref 3.5–5.0)
Alkaline Phosphatase: 130 U/L — ABNORMAL HIGH (ref 38–126)
Anion gap: 14 (ref 5–15)
BUN: 18 mg/dL (ref 6–20)
CO2: 27 mmol/L (ref 22–32)
Calcium: 8.5 mg/dL — ABNORMAL LOW (ref 8.9–10.3)
Chloride: 90 mmol/L — ABNORMAL LOW (ref 98–111)
Creatinine, Ser: 5.92 mg/dL — ABNORMAL HIGH (ref 0.61–1.24)
GFR, Estimated: 11 mL/min — ABNORMAL LOW (ref >60)
Glucose, Bld: 443 mg/dL — ABNORMAL HIGH (ref 70–99)
Potassium: 3.9 mmol/L (ref 3.5–5.1)
Sodium: 131 mmol/L — ABNORMAL LOW (ref 135–145)
Total Bilirubin: 0.5 mg/dL (ref 0.3–1.2)
Total Protein: 8.6 g/dL — ABNORMAL HIGH (ref 6.5–8.1)

## 2022-12-11 LAB — I-STAT CHEM 8, ED
BUN: 17 mg/dL (ref 6–20)
Calcium, Ion: 1.04 mmol/L — ABNORMAL LOW (ref 1.15–1.40)
Chloride: 89 mmol/L — ABNORMAL LOW (ref 98–111)
Creatinine, Ser: 6.6 mg/dL — ABNORMAL HIGH (ref 0.61–1.24)
Glucose, Bld: 451 mg/dL — ABNORMAL HIGH (ref 70–99)
HCT: 36 % — ABNORMAL LOW (ref 39.0–52.0)
Hemoglobin: 12.2 g/dL — ABNORMAL LOW (ref 13.0–17.0)
Potassium: 4 mmol/L (ref 3.5–5.1)
Sodium: 132 mmol/L — ABNORMAL LOW (ref 135–145)
TCO2: 28 mmol/L (ref 22–32)

## 2022-12-11 LAB — CBG MONITORING, ED
Glucose-Capillary: 406 mg/dL — ABNORMAL HIGH (ref 70–99)
Glucose-Capillary: 355 mg/dL — ABNORMAL HIGH (ref 70–99)

## 2022-12-11 MED ORDER — FLUORESCEIN SODIUM 1 MG OP STRP
1.0000 | ORAL_STRIP | Freq: Once | OPHTHALMIC | Status: AC
  Administered 2022-12-11: 1 via OPHTHALMIC
  Filled 2022-12-11: qty 1

## 2022-12-11 MED ORDER — TETRACAINE HCL 0.5 % OP SOLN
2.0000 [drp] | Freq: Once | OPHTHALMIC | Status: AC
  Administered 2022-12-11: 2 [drp] via OPHTHALMIC
  Filled 2022-12-11: qty 4

## 2022-12-11 MED ORDER — DIPHENHYDRAMINE HCL 50 MG/ML IJ SOLN
25.0000 mg | Freq: Once | INTRAMUSCULAR | Status: AC
  Administered 2022-12-11: 25 mg via INTRAVENOUS
  Filled 2022-12-11: qty 1

## 2022-12-11 MED ORDER — METOCLOPRAMIDE HCL 5 MG/ML IJ SOLN
10.0000 mg | Freq: Once | INTRAMUSCULAR | Status: AC
  Administered 2022-12-11: 10 mg via INTRAVENOUS
  Filled 2022-12-11: qty 2

## 2022-12-11 MED ORDER — GATIFLOXACIN 0.5 % OP SOLN
1.0000 [drp] | Freq: Four times a day (QID) | OPHTHALMIC | Status: DC
  Administered 2022-12-11: 1 [drp] via OPHTHALMIC
  Filled 2022-12-11: qty 2.5

## 2022-12-11 MED ORDER — MORPHINE SULFATE (PF) 4 MG/ML IV SOLN
4.0000 mg | Freq: Once | INTRAVENOUS | Status: AC
  Administered 2022-12-11: 4 mg via INTRAVENOUS
  Filled 2022-12-11: qty 1

## 2022-12-11 MED ORDER — INSULIN ASPART 100 UNIT/ML IJ SOLN
5.0000 [IU] | Freq: Once | INTRAMUSCULAR | Status: AC
  Administered 2022-12-11: 5 [IU] via SUBCUTANEOUS
  Filled 2022-12-11: qty 0.05

## 2022-12-11 NOTE — ED Triage Notes (Signed)
Patient coming to ED for evaluation of L eye pain, bloody drainage, and visual changes.  Reports symptoms started one week ago.  No reports of fever or injury.  Eye pain is causing headache and "shooting pain through my head."  Swelling noted to eyelids.  Eye movement is painful.  Pt is dialysis patient.  Goes MWF.  No reports of missed appointments

## 2022-12-11 NOTE — ED Notes (Addendum)
Visual acuity exam per MD order. Per pt- able to see the shape of the poster on the door, but unable to read any of the letters on the poster with neither left eye, right eye, or both eyes. RN advised. [employer]

## 2022-12-11 NOTE — Discharge Instructions (Addendum)
Please use gatifloxacin 4 times daily for a week  You need to call Dr. Benard Rink office and they are expecting you between 8 and 10 AM tomorrow  Please take your insulin for your diabetes  You need to follow-up with your primary care doctor  Return to ER if you have severe pain and uncontrolled bleeding from the left eye

## 2022-12-11 NOTE — ED Notes (Signed)
POC CBG [id number] in triage.  Pt reports he has not taken his insulin today

## 2022-12-11 NOTE — ED Provider Notes (Signed)
[facility] EMERGENCY DEPARTMENT AT [facility] Provider Note   CSN: [id number] Arrival date & time: 12/11/22  1952     History  Chief Complaint  Patient presents with   Eye Problem    Alvin Daniels is a 52 y.o. male hx of glaucoma, here with L eye bleeding. Patient has been having left eye pain and bleeding for the last week. Patient was admitted to the hospital in March and saw Dr. Genia Del from ophthalmology and was diagnosed with neurovascular glauoma, band keratopathy. Patient was placed on erythromycin and brimondine, dorzaolamide, and timolol. Patient has been blind in left eye.  Patient also has elevated blood sugar of 451 and has not been taking his insulin.  The history is provided by the patient and a caregiver.       Home Medications Prior to Admission medications   Medication Sig Start Date End Date Taking? Authorizing Provider  bictegravir-emtricitabine-tenofovir AF (BIKTARVY) 50-200-25 MG TABS tablet Take 1 tablet by mouth daily. 05/29/22   Randall Hiss, MD  Blood Glucose Monitoring Suppl (TRUE METRIX METER) w/Device KIT Use kit to check glucose 3 (three) times daily. 12/27/21   Hoy Register, MD  dorzolamide (TRUSOPT) 2 % ophthalmic solution Place 1 drop into the left eye 2 (two) times daily. 08/03/22 12/12/22  Lanae Boast, MD  doxycycline (VIBRAMYCIN) 100 MG capsule Take 1 capsule (100 mg total) by mouth 2 (two) times daily. 08/22/22   Carroll Sage, PA-C  erythromycin ophthalmic ointment Place into the left eye 2 (two) times daily. 08/03/22   Lanae Boast, MD  erythromycin ophthalmic ointment Apply a small amount into affected (left eyelid) eye twice a day for 10 days 12/09/22 12/19/22    glucose blood (TRUE METRIX BLOOD GLUCOSE TEST) test strip use 1 test strip to check glucose 3 (three) times daily. 09/25/22   Georganna Skeans, MD  insulin aspart protamine - aspart (NOVOLOG 70/30 FLEXPEN) (70-30) 100 UNIT/ML FlexPen Inject 20 Units into  the skin 2 (two) times daily. 11/01/22   Georganna Skeans, MD  insulin NPH-regular Human (HUMULIN 70/30) (70-30) 100 UNIT/ML injection Inject 20 units subcutaneously twice daily with meals 09/25/22   Georganna Skeans, MD  insulin NPH-regular Human (HUMULIN 70/30) (70-30) 100 UNIT/ML injection Inject 15 unit subcutaneously twice a day as directed before meals 09/25/22   Georganna Skeans, MD  Insulin Pen Needle 31G X 5 MM MISC Utilize as needed to inject insulin sq as recommended (BID) 11/01/22   Georganna Skeans, MD  Insulin Syringe-Needle U-100 (BD INSULIN SYRINGE ULTRAFINE) 31G X 15/64" 0.5 ML MISC 1 each by Does not apply route 2 (two) times daily. 09/25/22   Georganna Skeans, MD  midodrine (PROAMATINE) 10 MG tablet Take 1 tablet (10 mg total) by mouth 3 (three) times daily with meals. 01/02/22   Randall Hiss, MD  mupirocin ointment (BACTROBAN) 2 % Place 1 Application into the nose 2 (two) times daily. 08/03/22   Lanae Boast, MD  Pitavastatin Magnesium 4 MG TABS Take 4 mg by mouth daily. Patient not taking: Reported on 08/01/2022 05/29/22   Daiva Eves, Lisette Grinder, MD  prednisoLONE acetate (PRED FORTE) 1 % ophthalmic suspension Place 1 drop into the left eye 3 (three) times daily. 08/03/22   Lanae Boast, MD  TRUEplus Lancets 28G MISC Use to test 3 (three) times daily. 12/27/21   Hoy Register, MD  bictegravir-emtricitabine-tenofovir AF (BIKTARVY) 50-200-25 MG TABS tablet Take 1 tablet by mouth daily. 01/02/22   Daiva Eves, Remi Haggard  N, MD      Allergies    Patient has no known allergies.    Review of Systems   Review of Systems  All other systems reviewed and are negative.   Physical Exam Updated Vital Signs BP 96/61   Pulse 78   Temp 98.8 F (37.1 C) (Oral)   Resp 18   Ht 5\' 2"  (1.575 m)   Wt 49 kg   SpO2 98%   BMI 19.75 kg/m  Physical Exam Vitals and nursing note reviewed.  Constitutional:      Comments: Chronically ill-appearing  HENT:     Head: Normocephalic.  Eyes:      Comments: Patient  has a large central corneal ulcer on the left side.  Patient had some purulent drainage mixed with blood.  Extraocular movements are intact.  Patient is unable to finger count on the left side  Cardiovascular:     Rate and Rhythm: Normal rate and regular rhythm.     Pulses: Normal pulses.     Heart sounds: Normal heart sounds.  Pulmonary:     Effort: Pulmonary effort is normal.     Breath sounds: Normal breath sounds.  Abdominal:     General: Abdomen is flat.     Palpations: Abdomen is soft.  Musculoskeletal:     Cervical back: Normal range of motion.     Comments: Bilateral AKA   Skin:    General: Skin is warm.  Neurological:     General: No focal deficit present.     Mental Status: He is alert and oriented to person, place, and time.  Psychiatric:        Mood and Affect: Mood normal.        Behavior: Behavior normal.     ED Results / Procedures / Treatments   Labs (all labs ordered are listed, but only abnormal results are displayed) Labs Reviewed  CBC WITH DIFFERENTIAL/PLATELET - Abnormal; Notable for the following components:      Result Value   RBC 3.42 (*)    Hemoglobin 11.3 (*)    HCT 33.4 (*)    Eosinophils Absolute 0.6 (*)    All other components within normal limits  COMPREHENSIVE METABOLIC PANEL - Abnormal; Notable for the following components:   Sodium 131 (*)    Chloride 90 (*)    Glucose, Bld 443 (*)    Creatinine, Ser 5.92 (*)    Calcium 8.5 (*)    Total Protein 8.6 (*)    Alkaline Phosphatase 130 (*)    GFR, Estimated 11 (*)    All other components within normal limits  CBG MONITORING, ED - Abnormal; Notable for the following components:   Glucose-Capillary 406 (*)    All other components within normal limits  I-STAT CHEM 8, ED - Abnormal; Notable for the following components:   Sodium 132 (*)    Chloride 89 (*)    Creatinine, Ser 6.60 (*)    Glucose, Bld 451 (*)    Calcium, Ion 1.04 (*)    Hemoglobin 12.2 (*)    HCT 36.0 (*)    All other  components within normal limits  CBG MONITORING, ED    EKG None  Radiology CT Orbits Wo Contrast  Result Date: 12/11/2022 CLINICAL DATA:  Left periorbital cellulitis, eye pain, headache EXAM: CT ORBITS WITHOUT CONTRAST TECHNIQUE: Multidetector CT imaging of the orbits was performed using the standard protocol without intravenous contrast. Multiplanar CT image reconstructions were also generated. RADIATION DOSE REDUCTION: This  exam was performed according to the departmental dose-optimization program which includes automated exposure control, adjustment of the mA and/or kV according to patient size and/or use of iterative reconstruction technique. COMPARISON:  06/11/2022 FINDINGS: Orbits: Postsurgical changes from left intra-ocular left lens implant. There is minimal retinal calcification along the inferior aspect the left lobe, of uncertain etiology. Right globe is unremarkable. There are no inflammatory changes within either orbit. Unenhanced imaging of the optic nerves and extraocular muscles appear unremarkable. Visible paranasal sinuses: Clear. Soft tissues: Unremarkable. Diffuse atherosclerosis consistent with history of end-stage renal disease. Osseous: There are no acute or destructive bony abnormalities. Limited intracranial: No acute or significant finding. IMPRESSION: 1. Postsurgical changes left globe. 2. No acute abnormality within either orbit. No acute bony abnormalities. Electronically Signed   By: Sharlet Salina M.D.   On: 12/11/2022 22:21   CT HEAD WO CONTRAST ( )  Result Date: 12/11/2022 CLINICAL DATA:  Headache, left eye pain, visual changes EXAM: CT HEAD WITHOUT CONTRAST TECHNIQUE: Contiguous axial images were obtained from the base of the skull through the vertex without intravenous contrast. RADIATION DOSE REDUCTION: This exam was performed according to the departmental dose-optimization program which includes automated exposure control, adjustment of the mA and/or kV according  to patient size and/or use of iterative reconstruction technique. COMPARISON:  08/11/2022 FINDINGS: Brain: No acute infarct or hemorrhage. Lateral ventricles and midline structures are unremarkable. No acute extra-axial fluid collections. No mass effect. Vascular: Stable atherosclerosis.  No hyperdense vessel. Skull: Scalp edema at the parietal convexity could reflect contusion. No underlying fracture. No acute bony abnormalities. Sinuses/Orbits: No acute finding. Other: None. IMPRESSION: 1. Scalp edema at the parietal convexity, please correlate with any history of trauma. No underlying fracture. 2. No acute intracranial process. Electronically Signed   By: Sharlet Salina M.D.   On: 12/11/2022 22:16    Procedures Procedures    Medications Ordered in ED Medications  gatifloxacin (ZYMAXID) 0.5 % ophthalmic drops 1 drop (has no administration in time range)  tetracaine (PONTOCAINE) 0.5 % ophthalmic solution 2 drop (2 drops Right Eye Given by Other 12/11/22 2141)  insulin aspart (novoLOG) injection 5 Units (5 Units Subcutaneous Given 12/11/22 2150)  morphine (PF) 4 MG/ML injection 4 mg (4 mg Intravenous Given 12/11/22 2142)  metoCLOPramide (REGLAN) injection 10 mg (10 mg Intravenous Given 12/11/22 2142)  diphenhydrAMINE (BENADRYL) injection 25 mg (25 mg Intravenous Given 12/11/22 2142)  insulin aspart (novoLOG) injection 5 Units (5 Units Subcutaneous Given 12/11/22 2150)  fluorescein ophthalmic strip 1 strip (1 strip Left Eye Given by Other 12/11/22 2234)    ED Course/ Medical Decision Making/ A&P                                 Medical Decision Making Alvin Daniels is a 52 y.o. male history of dialysis and last dialysis was today here presenting with headache and blurry vision.  Patient had some bleeding from the left eye for the last week or so.  Patient is also hyperglycemic.  He has a large corneal ulcer on the left side which is likely causing his symptoms.  He is blind on the  left eye for 2 years already.  Plan to get CT head and CT orbits to rule out orbital cellulitis.  Will also get blood work and give insulin and pain medicine and migraine cocktail  10:44 PM Reviewed patient's labs and creatinine is 5.9 which is baseline.  Potassium  is 3.9.  His blood sugar was 451 and came down to 355.  His CT head and CT orbits showed chronic changes with no orbital cellulitis.  I discussed case with Dr. Clelia Croft who is covering for Dr. Genia Del.  He states that patient can be started on moxifloxacin drops and follow-up with ophthalmology tomorrow   Problems Addressed: Corneal ulcer of left eye: acute illness or injury ESRD (end stage renal disease) on dialysis [facility]): acute illness or injury Hyperglycemia: acute illness or injury  Amount and/or Complexity of Data Reviewed Labs: ordered. Decision-making details documented in ED Course. Radiology: ordered and independent interpretation performed. Decision-making details documented in ED Course.  Risk Prescription drug management.    Final Clinical Impression(s) / ED Diagnoses Final diagnoses:  None    Rx / DC Orders ED Discharge Orders     None         Charlynne Pander, MD 12/11/22 2246

## 2022-12-12 ENCOUNTER — Encounter (HOSPITAL_COMMUNITY): Payer: Self-pay | Admitting: *Deleted

## 2022-12-12 ENCOUNTER — Other Ambulatory Visit: Payer: Self-pay

## 2022-12-12 ENCOUNTER — Emergency Department (HOSPITAL_COMMUNITY)
Admission: EM | Admit: 2022-12-12 | Discharge: 2022-12-12 | Disposition: A | Discharge status: Home / Self Care | Payer: Self-pay | Attending: Emergency Medicine | Admitting: Emergency Medicine

## 2022-12-12 DIAGNOSIS — H16012 Central corneal ulcer, left eye: Secondary | ICD-10-CM | POA: Insufficient documentation

## 2022-12-12 DIAGNOSIS — Z992 Dependence on renal dialysis: Secondary | ICD-10-CM | POA: Insufficient documentation

## 2022-12-12 DIAGNOSIS — Z21 Asymptomatic human immunodeficiency virus [HIV] infection status: Secondary | ICD-10-CM | POA: Insufficient documentation

## 2022-12-12 DIAGNOSIS — H16002 Unspecified corneal ulcer, left eye: Secondary | ICD-10-CM

## 2022-12-12 DIAGNOSIS — E1122 Type 2 diabetes mellitus with diabetic chronic kidney disease: Secondary | ICD-10-CM | POA: Insufficient documentation

## 2022-12-12 DIAGNOSIS — Z794 Long term (current) use of insulin: Secondary | ICD-10-CM | POA: Insufficient documentation

## 2022-12-12 DIAGNOSIS — N186 End stage renal disease: Secondary | ICD-10-CM | POA: Insufficient documentation

## 2022-12-12 DIAGNOSIS — I120 Hypertensive chronic kidney disease with stage 5 chronic kidney disease or end stage renal disease: Secondary | ICD-10-CM | POA: Insufficient documentation

## 2022-12-12 LAB — BASIC METABOLIC PANEL
Anion gap: 15 (ref 5–15)
BUN: 28 mg/dL — ABNORMAL HIGH (ref 6–20)
CO2: 29 mmol/L (ref 22–32)
Calcium: 9.3 mg/dL (ref 8.9–10.3)
Chloride: 90 mmol/L — ABNORMAL LOW (ref 98–111)
Creatinine, Ser: 7.64 mg/dL — ABNORMAL HIGH (ref 0.61–1.24)
GFR, Estimated: 8 mL/min — ABNORMAL LOW (ref >60)
Glucose, Bld: 227 mg/dL — ABNORMAL HIGH (ref 70–99)
Potassium: 4.6 mmol/L (ref 3.5–5.1)
Sodium: 134 mmol/L — ABNORMAL LOW (ref 135–145)

## 2022-12-12 LAB — CBC WITH DIFFERENTIAL/PLATELET
Abs Immature Granulocytes: 0.02 10*3/uL (ref 0.00–0.07)
Basophils Absolute: 0.1 10*3/uL (ref 0.0–0.1)
Basophils Relative: 1 %
Eosinophils Absolute: 0.5 10*3/uL (ref 0.0–0.5)
Eosinophils Relative: 6 %
HCT: 34.5 % — ABNORMAL LOW (ref 39.0–52.0)
Hemoglobin: 11.5 g/dL — ABNORMAL LOW (ref 13.0–17.0)
Immature Granulocytes: 0 %
Lymphocytes Relative: 15 %
Lymphs Abs: 1.2 10*3/uL (ref 0.7–4.0)
MCH: 32.9 pg (ref 26.0–34.0)
MCHC: 33.3 g/dL (ref 30.0–36.0)
MCV: 98.6 fL (ref 80.0–100.0)
Monocytes Absolute: 0.6 10*3/uL (ref 0.1–1.0)
Monocytes Relative: 8 %
Neutro Abs: 5.7 10*3/uL (ref 1.7–7.7)
Neutrophils Relative %: 70 %
Platelets: 196 10*3/uL (ref 150–400)
RBC: 3.5 MIL/uL — ABNORMAL LOW (ref 4.22–5.81)
RDW: 12.5 % (ref 11.5–15.5)
WBC: 8.1 10*3/uL (ref 4.0–10.5)
nRBC: 0 % (ref 0.0–0.2)

## 2022-12-12 LAB — CBG MONITORING, ED: Glucose-Capillary: 230 mg/dL — ABNORMAL HIGH (ref 70–99)

## 2022-12-12 MED ORDER — MORPHINE SULFATE (PF) 4 MG/ML IV SOLN
4.0000 mg | Freq: Once | INTRAVENOUS | Status: AC
  Administered 2022-12-12: 4 mg via INTRAMUSCULAR
  Filled 2022-12-12: qty 1

## 2022-12-12 NOTE — Discharge Instructions (Signed)
Your eye has a very bad infection and it is not going to be able to be treated with antibiotics.  The eye doctor has recommended that your IV removed.  Otherwise the infection can spread and you will continue to have pain.  The only facility that does this type of surgery in the area would be [facility].  Please return to our emergency department so that we can facilitate transfer to [facility] or present directly to [facility] emergency department if you change your mind about getting the eye surgery done.

## 2022-12-12 NOTE — Consult Note (Signed)
CC:  Chief Complaint  Patient presents with   Eye Drainage    HPI: Alvin Daniels is a 52 y.o. male w/ POH of corneal ulcer here as bounce back as was unable to get transportation to follow-up at [facility]. Pain and redness. See ED notes for more detail.  ROS: See HPI  PMH: Past Medical History:  Diagnosis Date   Acute bilateral low back pain 01/03/2021   AIDS ([name]) 11/22/2014   Back pain 06/09/2019   BKA stump complication ([name]) 06/09/2019   Chronic diarrhea    Chronic hepatitis C without hepatic coma ([name]) 11/22/2014   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[name])    Right   Diabetic neuropathy ([name])    ESRD (end stage renal disease) on dialysis ([name])    "TTS; don't remember street name" (05/03/2014)   Hepatitis C    HIV INFECTION 06/27/2010   Qualifier: Diagnosis of  By: Jennye Moccasin CMA ( AAMA), Jacqueline     Hypotension 06/02/2012   Metabolic bone disease 06/18/2012   MRSA infection    Normocytic anemia 06/17/2012   Pancreatitis    Pressure ulcer of BKA stump, stage 2 ([name]) 11/22/2014   Renal disorder    Renal insufficiency    Severe protein-calorie malnutrition ([name]) 06/17/2012   Uncontrolled diabetes mellitus with complications 06/27/2010   Annotation: uncontrolled Qualifier: Diagnosis of  By: Jennye Moccasin CMA ( AAMA), Jacqueline     Vaccine counseling 05/29/2022    PSH: Past Surgical History:  Procedure Laterality Date   AMPUTATION Left 04/20/2014   Procedure: 3rd toe amputation, 4th Toe Amputation,  5th Toe Amputation;  Surgeon: Nadara Mustard, MD;  Location: [facility] OR;  Service: Orthopedics;  Laterality: Left;   AMPUTATION Left 05/02/2014   Procedure: Midfoot Amputation;  Surgeon: Nadara Mustard, MD;  Location: [facility] OR;  Service: Orthopedics;  Laterality: Left;   AMPUTATION Left 06/17/2014   Procedure: AMPUTATION BELOW KNEE;  Surgeon: Nadara Mustard, MD;  Location: [facility] OR;  Service: Orthopedics;  Laterality: Left;   AMPUTATION Right 09/04/2018   Procedure: RIGHT  BELOW KNEE AMPUTATION;  Surgeon: Nadara Mustard, MD;  Location: [facility] OR;  Service: Orthopedics;  Laterality: Right;  RIGHT BELOW KNEE AMPUTATION   AV FISTULA PLACEMENT Left    AV FISTULA PLACEMENT Left 05/10/2016   Procedure: Creation Left Arm Brachiocephalic Arteriovenous Fistula and Ligation of Radiocephalic Fistula;  Surgeon: Chuck Hint, MD;  Location: [facility] OR;  Service: Vascular;  Laterality: Left;   FEMUR IM NAIL Left 08/17/2016   Procedure: INTRAMEDULLARY (IM) RETROGRADE FEMORAL NAILING;  Surgeon: Eldred Manges, MD;  Location: [facility] OR;  Service: Orthopedics;  Laterality: Left;   FOOT AMPUTATION THROUGH ANKLE Left 12/'21/2015   midfoot   IR GENERIC HISTORICAL Left 05/01/2016   IR THROMBECTOMY AV FISTULA W/THROMBOLYSIS/PTA INC/SHUNT/IMG LEFT 05/01/2016 Oley Balm, MD [facility]-INTER[facility] RAD   IR GENERIC HISTORICAL  05/01/2016   IR US GUIDE VASC ACCESS LEFT 05/01/2016 Oley Balm, MD [facility]   IR GENERIC HISTORICAL  05/07/2016   IR FLUORO GUIDE CV LINE RIGHT 05/07/2016 Gilmer Mor, DO [facility]   IR GENERIC HISTORICAL  05/07/2016   IR US GUIDE VASC ACCESS RIGHT 05/07/2016 Gilmer Mor, DO [facility]   IR GENERIC HISTORICAL  05/22/2016   IR US GUIDE VASC ACCESS RIGHT 05/22/2016 Berdine Dance, MD [facility]   IR GENERIC HISTORICAL  05/22/2016   IR FLUORO GUIDE CV LINE RIGHT 05/22/2016 Berdine Dance, MD [facility]   IR REMOVAL TUN CV CATH W/O FL  08/21/2016   PERIPHERAL VASCULAR CATHETERIZATION Left 05/09/2016   Procedure: A/V Fistulagram;  Surgeon: Chuck Hint, MD;  Location: [facility] INVASIVE CV LAB;  Service: Cardiovascular;  Laterality: Left;  arm   TEE WITHOUT CARDIOVERSION N/A 06/22/2018   Procedure: TRANSESOPHAGEAL ECHOCARDIOGRAM (TEE);  Surgeon: Jake Bathe, MD;  Location: [facility] ENDOSCOPY;  Service: Cardiovascular;  Laterality: N/A;    Meds: No current facility-administered medications on file prior to encounter.   Current Outpatient Medications on File Prior to  Encounter  Medication Sig Dispense Refill   bictegravir-emtricitabine-tenofovir AF (BIKTARVY) 50-200-25 MG TABS tablet Take 1 tablet by mouth daily. 30 tablet 11   Blood Glucose Monitoring Suppl (TRUE METRIX METER) w/Device KIT Use kit to check glucose 3 (three) times daily. 1 kit 0   dorzolamide (TRUSOPT) 2 % ophthalmic solution Place 1 drop into the left eye 2 (two) times daily. 10 mL 0   doxycycline (VIBRAMYCIN) 100 MG capsule Take 1 capsule (100 mg total) by mouth 2 (two) times daily. 20 capsule 0   erythromycin ophthalmic ointment Place into the left eye 2 (two) times daily. 3.5 g 0   erythromycin ophthalmic ointment Apply a small amount into affected (left eyelid) eye twice a day for 10 days 3.5 g 0   glucose blood (TRUE METRIX BLOOD GLUCOSE TEST) test strip use 1 test strip to check glucose 3 (three) times daily. 100 each 2   insulin aspart protamine - aspart (NOVOLOG 70/30 FLEXPEN) (70-30) 100 UNIT/ML FlexPen Inject 20 Units into the skin 2 (two) times daily. 15 mL 11   insulin NPH-regular Human (HUMULIN 70/30) (70-30) 100 UNIT/ML injection Inject 20 units subcutaneously twice daily with meals 20 mL 5   insulin NPH-regular Human (HUMULIN 70/30) (70-30) 100 UNIT/ML injection Inject 15 unit subcutaneously twice a day as directed before meals 10 mL 5   Insulin Pen Needle 31G X 5 MM MISC Utilize as needed to inject insulin sq as recommended (BID) 100 each 5   Insulin Syringe-Needle U-100 (BD INSULIN SYRINGE ULTRAFINE) 31G X 15/64" 0.5 ML MISC 1 each by Does not apply route 2 (two) times daily. 100 each 5   midodrine (PROAMATINE) 10 MG tablet Take 1 tablet (10 mg total) by mouth 3 (three) times daily with meals. 90 tablet 11   mupirocin ointment (BACTROBAN) 2 % Place 1 Application into the nose 2 (two) times daily. 22 g 0   Pitavastatin Magnesium 4 MG TABS Take 4 mg by mouth daily. (Patient not taking: Reported on 08/01/2022) 30 tablet 11   prednisoLONE acetate (PRED FORTE) 1 % ophthalmic  suspension Place 1 drop into the left eye 3 (three) times daily. 5 mL 0   TRUEplus Lancets 28G MISC Use to test 3 (three) times daily. 100 each 2   [DISCONTINUED] bictegravir-emtricitabine-tenofovir AF (BIKTARVY) 50-200-25 MG TABS tablet Take 1 tablet by mouth daily. 30 tablet 11    SH: Social History   Socioeconomic History   Marital status: Single    Spouse name: Not on file   Number of children: Not on file   Years of education: Not on file   Highest education level: Not on file  Occupational History   Not on file  Tobacco Use   Smoking status: Never   Smokeless tobacco: Never  Vaping Use   Vaping status: Never Used  Substance and Sexual Activity   Alcohol use: No   Drug use: No   Sexual activity: Not Currently    Comment: declined condoms  Other Topics  Concern   Not on file  Social History Narrative   ** Merged History Encounter **       Social Determinants of Health   Financial Resource Strain: Not on file  Food Insecurity: Not on file  Transportation Needs: Not on file  Physical Activity: Not on file  Stress: Not on file  Social Connections: Not on file    FH: Family History  Problem Relation Age of Onset   Diabetes Mother    Diabetes Father     Exam:  Zenaida Niece: OD: 20/400 eq OS: NLP  CVF: OD: full OS: NLP  EOM: OD: full d/v OS: full d/v  Pupils: OD: 3 minimally to non reactive OS: no view - likely APD by reverse  IOP: by [place] OD:23  OS:15/16 at limbus/peripheral cornea - appears soft and potentially over restimating  External: OD: no periorbital edema, no proptosis OS: 1+ lid edema, no proptosis  [place]:   Pen Light Exam: L/L: OD: WNL OS: mild lid edema  C/S: OD: white and quiet OS: 1-2+ injection  K: OD: clear, no abnormal staining OS: corneal ulcer w/ possible descemetocele vs ruipture w/ prolapsed uveal tissue  A/C: OD: shallow and quiet appearing by pen light OS: shallow AC, hyphema in AC, ? ruptured  I: OD:  round and regular OS: no view of pupil  L: OD: NSC OS: no view  DFE: deferred - suspect narrow angle OD, not indicated OS   A/P:   1. Corneal Ulcer c/b Descemetocele vs Rupture w/ Uveal Prolapse OS: - Pt with NLP vision and pain - will need enucleation. - Do not feel admission for topic antibiotics alone is sufficient unless coordinated disposition for enucleation of left eye - Recommend consider transfer to [facility] for further management and enucleation  Asjia Berrios T. Sherryll Burger, MD [facility] [phone]

## 2022-12-12 NOTE — ED Provider Notes (Signed)
[place] EMERGENCY DEPARTMENT AT [facility] Provider Note   CSN: [id number] Arrival date & time: 12/12/22  1334     History  Chief Complaint  Patient presents with   Eye Drainage    Alvin Daniels is a 52 y.o. male.  Patient is a 52 year old male with a history of HIV, diabetes, end-stage renal disease on dialysis and hypertension who presents with pain to his left eye.  He has known blindness in the eye.  He was admitted in March of this year and was found to have bad headaches around his right eye.  Temporal arteritis was excluded.  He was seen by ophthalmology and diagnosed with neovascular glaucoma and being keratopathy.  He was started on eyedrops.  He says he ran out of the eyedrops and it has been hurting worse over the last week.  He has had some drainage from the eye which is different from his baseline.  He denies any fevers.  He was seen in the ED last night and had CT of the orbits which was unremarkable.  Dr. Sherryll Burger with ophthalmology was consulted and the plan was to start him on moxifloxacin drops and follow-up with ophthalmology this morning.  He said he did not have transportation and the pain was intense and he came back here for pain control.  He did complete dialysis yesterday.  History is obtained through a video language line.       Home Medications Prior to Admission medications   Medication Sig Start Date End Date Taking? Authorizing Provider  bictegravir-emtricitabine-tenofovir AF (BIKTARVY) 50-200-25 MG TABS tablet Take 1 tablet by mouth daily. 05/29/22   Randall Hiss, MD  Blood Glucose Monitoring Suppl (TRUE METRIX METER) w/Device KIT Use kit to check glucose 3 (three) times daily. 12/27/21   Hoy Register, MD  dorzolamide (TRUSOPT) 2 % ophthalmic solution Place 1 drop into the left eye 2 (two) times daily. 08/03/22 12/12/22  Lanae Boast, MD  doxycycline (VIBRAMYCIN) 100 MG capsule Take 1 capsule (100 mg total) by mouth 2 (two)  times daily. 08/22/22   Carroll Sage, PA-C  erythromycin ophthalmic ointment Place into the left eye 2 (two) times daily. 08/03/22   Lanae Boast, MD  erythromycin ophthalmic ointment Apply a small amount into affected (left eyelid) eye twice a day for 10 days 12/09/22 12/19/22    glucose blood (TRUE METRIX BLOOD GLUCOSE TEST) test strip use 1 test strip to check glucose 3 (three) times daily. 09/25/22   Georganna Skeans, MD  insulin aspart protamine - aspart (NOVOLOG 70/30 FLEXPEN) (70-30) 100 UNIT/ML FlexPen Inject 20 Units into the skin 2 (two) times daily. 11/01/22   Georganna Skeans, MD  insulin NPH-regular Human (HUMULIN 70/30) (70-30) 100 UNIT/ML injection Inject 20 units subcutaneously twice daily with meals 09/25/22   Georganna Skeans, MD  insulin NPH-regular Human (HUMULIN 70/30) (70-30) 100 UNIT/ML injection Inject 15 unit subcutaneously twice a day as directed before meals 09/25/22   Georganna Skeans, MD  Insulin Pen Needle 31G X 5 MM MISC Utilize as needed to inject insulin sq as recommended (BID) 11/01/22   Georganna Skeans, MD  Insulin Syringe-Needle U-100 (BD INSULIN SYRINGE ULTRAFINE) 31G X 15/64" 0.5 ML MISC 1 each by Does not apply route 2 (two) times daily. 09/25/22   Georganna Skeans, MD  midodrine (PROAMATINE) 10 MG tablet Take 1 tablet (10 mg total) by mouth 3 (three) times daily with meals. 01/02/22   Randall Hiss, MD  mupirocin ointment (  BACTROBAN) 2 % Place 1 Application into the nose 2 (two) times daily. 08/03/22   Lanae Boast, MD  Pitavastatin Magnesium 4 MG TABS Take 4 mg by mouth daily. Patient not taking: Reported on 08/01/2022 05/29/22   Daiva Eves, Lisette Grinder, MD  prednisoLONE acetate (PRED FORTE) 1 % ophthalmic suspension Place 1 drop into the left eye 3 (three) times daily. 08/03/22   Lanae Boast, MD  TRUEplus Lancets 28G MISC Use to test 3 (three) times daily. 12/27/21   Hoy Register, MD  bictegravir-emtricitabine-tenofovir AF (BIKTARVY) 50-200-25 MG TABS tablet Take 1 tablet by  mouth daily. 01/02/22   Randall Hiss, MD      Allergies    Patient has no known allergies.    Review of Systems   Review of Systems  Eyes:  Positive for pain, discharge and redness.  Gastrointestinal:  Negative for nausea and vomiting.  Skin:  Negative for wound.  Neurological:  Positive for headaches.  Psychiatric/Behavioral:  Negative for confusion.   All other systems reviewed and are negative.   Physical Exam Updated Vital Signs BP (!) 158/87 (BP Location: Right Arm)   Pulse 87   Temp (!) 97.5 F (36.4 C) (Oral)   Resp 18   Ht 5\' 2"  (1.575 m)   SpO2 100%   BMI 19.75 kg/m  Physical Exam Constitutional:      Appearance: He is well-developed.  HENT:     Head: Normocephalic and atraumatic.  Eyes:     Comments: Left eye is injected with purulent drainage.  There is a large ulcerated wound in the central cornea.  Cardiovascular:     Rate and Rhythm: Normal rate and regular rhythm.     Heart sounds: Normal heart sounds.  Pulmonary:     Effort: Pulmonary effort is normal. No respiratory distress.     Breath sounds: Normal breath sounds. No wheezing or rales.  Chest:     Chest wall: No tenderness.  Abdominal:     General: Bowel sounds are normal.     Palpations: Abdomen is soft.     Tenderness: There is no abdominal tenderness. There is no guarding or rebound.  Musculoskeletal:        General: Normal range of motion.     Cervical back: Normal range of motion and neck supple.     Comments: Bilateral lower leg amputations  Lymphadenopathy:     Cervical: No cervical adenopathy.  Skin:    General: Skin is warm and dry.     Findings: No rash.  Neurological:     Mental Status: He is alert and oriented to person, place, and time.     ED Results / Procedures / Treatments   Labs (all labs ordered are listed, but only abnormal results are displayed) Labs Reviewed  BASIC METABOLIC PANEL - Abnormal; Notable for the following components:      Result Value    Sodium 134 (*)    Chloride 90 (*)    Glucose, Bld 227 (*)    BUN 28 (*)    Creatinine, Ser 7.64 (*)    GFR, Estimated 8 (*)    All other components within normal limits  CBC WITH DIFFERENTIAL/PLATELET - Abnormal; Notable for the following components:   RBC 3.50 (*)    Hemoglobin 11.5 (*)    HCT 34.5 (*)    All other components within normal limits  CBG MONITORING, ED - Abnormal; Notable for the following components:   Glucose-Capillary 230 (*)  All other components within normal limits    EKG None  Radiology CT Orbits Wo Contrast  Result Date: 12/11/2022 CLINICAL DATA:  Left periorbital cellulitis, eye pain, headache EXAM: CT ORBITS WITHOUT CONTRAST TECHNIQUE: Multidetector CT imaging of the orbits was performed using the standard protocol without intravenous contrast. Multiplanar CT image reconstructions were also generated. RADIATION DOSE REDUCTION: This exam was performed according to the departmental dose-optimization program which includes automated exposure control, adjustment of the mA and/or kV according to patient size and/or use of iterative reconstruction technique. COMPARISON:  06/11/2022 FINDINGS: Orbits: Postsurgical changes from left intra-ocular left lens implant. There is minimal retinal calcification along the inferior aspect the left lobe, of uncertain etiology. Right globe is unremarkable. There are no inflammatory changes within either orbit. Unenhanced imaging of the optic nerves and extraocular muscles appear unremarkable. Visible paranasal sinuses: Clear. Soft tissues: Unremarkable. Diffuse atherosclerosis consistent with history of end-stage renal disease. Osseous: There are no acute or destructive bony abnormalities. Limited intracranial: No acute or significant finding. IMPRESSION: 1. Postsurgical changes left globe. 2. No acute abnormality within either orbit. No acute bony abnormalities. Electronically Signed   By: Sharlet Salina M.D.   On: 12/11/2022 22:21    CT HEAD WO CONTRAST ( )  Result Date: 12/11/2022 CLINICAL DATA:  Headache, left eye pain, visual changes EXAM: CT HEAD WITHOUT CONTRAST TECHNIQUE: Contiguous axial images were obtained from the base of the skull through the vertex without intravenous contrast. RADIATION DOSE REDUCTION: This exam was performed according to the departmental dose-optimization program which includes automated exposure control, adjustment of the mA and/or kV according to patient size and/or use of iterative reconstruction technique. COMPARISON:  08/11/2022 FINDINGS: Brain: No acute infarct or hemorrhage. Lateral ventricles and midline structures are unremarkable. No acute extra-axial fluid collections. No mass effect. Vascular: Stable atherosclerosis.  No hyperdense vessel. Skull: Scalp edema at the parietal convexity could reflect contusion. No underlying fracture. No acute bony abnormalities. Sinuses/Orbits: No acute finding. Other: None. IMPRESSION: 1. Scalp edema at the parietal convexity, please correlate with any history of trauma. No underlying fracture. 2. No acute intracranial process. Electronically Signed   By: Sharlet Salina M.D.   On: 12/11/2022 22:16    Procedures Procedures    Medications Ordered in ED Medications  morphine (PF) 4 MG/ML injection 4 mg (4 mg Intramuscular Given 12/12/22 1626)    ED Course/ Medical Decision Making/ A&P                                 Medical Decision Making Amount and/or Complexity of Data Reviewed Labs: ordered.  Risk Prescription drug management.   Patient is a 52 year old male who presents today after he was seen yesterday with a large corneal ulcer of his left eye.  He was supposed to follow-up with ophthalmology this morning but said that he did not have transportation.  His eye appears to be worsening.  Dr. Sherryll Burger with ophthalmology came and evaluated the patient.  He feels that there is erosion of the cornea and that the eye cannot be salvaged.  He  recommends enucleation of the eye which can only be done at [facility].  I explained this to the patient via a Spanish interpreter.  It was recommended that we transfer the patient to [facility] to have this done.  I do not feel that patient will be able to arrange this on his own as an outpatient given that he was  not able to get transportation just to her local ophthalmologist this morning.  I advised him that it would be best if we tried to get him admitted directly to [facility] to have the surgery.  At this point he is refusing and says that he wants to be discharged to go home and talk it over with his family.  He will continue using the moxifloxacin drops that he was given last night.  I advised him that the infection can continue to spread and he will continue having pain in the eye.  He was advised to return to the emergency room either here so that we can arrange for transfer or directly to [facility] if he was able to do that.  Final Clinical Impression(s) / ED Diagnoses Final diagnoses:  Corneal erosion of left eye    Rx / DC Orders ED Discharge Orders     None         Rolan Bucco, MD 12/12/22 1745

## 2022-12-12 NOTE — ED Triage Notes (Signed)
Pt returns due to eye pain, seen and treated last night, feels it is not any better. Pain worse goping to the top of his head. Left eye pain and drainage.

## 2022-12-16 ENCOUNTER — Other Ambulatory Visit: Payer: Self-pay

## 2022-12-18 ENCOUNTER — Other Ambulatory Visit: Payer: Self-pay

## 2022-12-18 ENCOUNTER — Ambulatory Visit: Payer: Self-pay

## 2022-12-18 ENCOUNTER — Encounter: Payer: Self-pay | Admitting: Infectious Disease

## 2022-12-18 ENCOUNTER — Ambulatory Visit (INDEPENDENT_AMBULATORY_CARE_PROVIDER_SITE_OTHER): Payer: Self-pay | Admitting: Infectious Disease

## 2022-12-18 VITALS — BP 124/77 | HR 90 | Temp 97.7°F | Ht 63.0 in | Wt 109.0 lb

## 2022-12-18 DIAGNOSIS — N186 End stage renal disease: Secondary | ICD-10-CM

## 2022-12-18 DIAGNOSIS — H16002 Unspecified corneal ulcer, left eye: Secondary | ICD-10-CM

## 2022-12-18 DIAGNOSIS — H16009 Unspecified corneal ulcer, unspecified eye: Secondary | ICD-10-CM

## 2022-12-18 DIAGNOSIS — H18732 Descemetocele, left eye: Secondary | ICD-10-CM

## 2022-12-18 DIAGNOSIS — Z992 Dependence on renal dialysis: Secondary | ICD-10-CM

## 2022-12-18 DIAGNOSIS — E1122 Type 2 diabetes mellitus with diabetic chronic kidney disease: Secondary | ICD-10-CM

## 2022-12-18 DIAGNOSIS — B20 Human immunodeficiency virus [HIV] disease: Secondary | ICD-10-CM

## 2022-12-18 HISTORY — DX: Unspecified corneal ulcer, unspecified eye: H16.009

## 2022-12-18 HISTORY — DX: Descemetocele, left eye: H18.732

## 2022-12-18 MED ORDER — BIKTARVY 50-200-25 MG PO TABS
1.0000 | ORAL_TABLET | Freq: Every day | ORAL | 11 refills | Status: DC
Start: 2022-12-18 — End: 2023-04-16

## 2022-12-18 MED ORDER — PITAVASTATIN MAGNESIUM 4 MG PO TABS: 4.0000 mg | ORAL_TABLET | Freq: Every day | ORAL | 11 refills | Status: DC

## 2022-12-18 NOTE — Progress Notes (Signed)
Subjective:  Chief complaint follow-up for HIV disease on medications  Patient ID: Alvin Daniels, male    DOB: May 20, 1970, 52 y.o.   MRN: [id number]  HPI   Note care provided with in person Spanish translator  52 year old Hispanic man living with HIV that has been perfectly controlled on Biktarvy. He has had bilateral BKA'S with problems tin July 2021 with MRSA bacteremia due to stump infection that was debrided. He could not get TEE due to low blood pressure.   Since I last saw him he was seen in the ER and found to have a corneal ulcer with Descemetocele v  rupture.  He was seen by ophthalmology twice after he came back to the ER having been unable to arrange transportation to be seen in outpatient ophthalmology world.  Ophthalmology recommended transfer to [facility] for enucleation of his left eye.  The patient unfortunately refused and decided to go home to think about it further.  He tells me that he went home and used some antibiotics that he got from [place] and that he stopped having eye pain and drainage to the eye.  I offered refer him to ophthalmology but he is adamant he does not want a 1 taking his eyeball out  His prostheses are fitting well he still is frustrated with dialysis not being very accommodating to him given his obstacles of public transportation and difficulty with the timing and getting to dialysis.         Past Medical History:  Diagnosis Date   Acute bilateral low back pain 01/03/2021   AIDS ([name]) 11/22/2014   Back pain 06/09/2019   BKA stump complication ([name]) 06/09/2019   Chronic diarrhea    Chronic hepatitis C without hepatic coma ([name]) 11/22/2014   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[name])    Right   Diabetic neuropathy ([name])    ESRD (end stage renal disease) on dialysis ([name])    "TTS; don't remember street name" (05/03/2014)   Hepatitis C    HIV INFECTION 06/27/2010   Qualifier: Diagnosis of  By: Jennye Moccasin CMA (  AAMA), Jacqueline     Hypotension 06/02/2012   Metabolic bone disease 06/18/2012   MRSA infection    Normocytic anemia 06/17/2012   Pancreatitis    Pressure ulcer of BKA stump, stage 2 ([name]) 11/22/2014   Renal disorder    Renal insufficiency    Severe protein-calorie malnutrition ([name]) 06/17/2012   Uncontrolled diabetes mellitus with complications 06/27/2010   Annotation: uncontrolled Qualifier: Diagnosis of  By: Jennye Moccasin CMA Duncan Dull), Jacqueline     Vaccine counseling 05/29/2022    Past Surgical History:  Procedure Laterality Date   AMPUTATION Left 04/20/2014   Procedure: 3rd toe amputation, 4th Toe Amputation,  5th Toe Amputation;  Surgeon: Nadara Mustard, MD;  Location: [facility] OR;  Service: Orthopedics;  Laterality: Left;   AMPUTATION Left 05/02/2014   Procedure: Midfoot Amputation;  Surgeon: Nadara Mustard, MD;  Location: [facility] OR;  Service: Orthopedics;  Laterality: Left;   AMPUTATION Left 06/17/2014   Procedure: AMPUTATION BELOW KNEE;  Surgeon: Nadara Mustard, MD;  Location: [facility] OR;  Service: Orthopedics;  Laterality: Left;   AMPUTATION Right 09/04/2018   Procedure: RIGHT BELOW KNEE AMPUTATION;  Surgeon: Nadara Mustard, MD;  Location: [facility] OR;  Service: Orthopedics;  Laterality: Right;  RIGHT BELOW KNEE AMPUTATION   AV FISTULA PLACEMENT Left    AV FISTULA PLACEMENT Left 05/10/2016   Procedure: Creation Left Arm Brachiocephalic Arteriovenous  Fistula and Ligation of Radiocephalic Fistula;  Surgeon: Chuck Hint, MD;  Location: [facility] OR;  Service: Vascular;  Laterality: Left;   FEMUR IM NAIL Left 08/17/2016   Procedure: INTRAMEDULLARY (IM) RETROGRADE FEMORAL NAILING;  Surgeon: Eldred Manges, MD;  Location: [facility] OR;  Service: Orthopedics;  Laterality: Left;   FOOT AMPUTATION THROUGH ANKLE Left 12/'21/2015   midfoot   IR GENERIC HISTORICAL Left 05/01/2016   IR THROMBECTOMY AV FISTULA W/THROMBOLYSIS/PTA INC/SHUNT/IMG LEFT 05/01/2016 Oley Balm, MD [facility]-INTERV RAD   IR GENERIC HISTORICAL   05/01/2016   IR US GUIDE VASC ACCESS LEFT 05/01/2016 Oley Balm, MD [facility]   IR GENERIC HISTORICAL  05/07/2016   IR FLUORO GUIDE CV LINE RIGHT 05/07/2016 Gilmer Mor, DO [facility]   IR GENERIC HISTORICAL  05/07/2016   IR US GUIDE VASC ACCESS RIGHT 05/07/2016 Gilmer Mor, DO [facility]   IR GENERIC HISTORICAL  05/22/2016   IR US GUIDE VASC ACCESS RIGHT 05/22/2016 Berdine Dance, MD [facility]   IR GENERIC HISTORICAL  05/22/2016   IR FLUORO GUIDE CV LINE RIGHT 05/22/2016 Berdine Dance, MD [facility]   IR REMOVAL TUN CV CATH W/O [facility]  08/21/2016   PERIPHERAL VASCULAR CATHETERIZATION Left 05/09/2016   Procedure: A/V Fistulagram;  Surgeon: Chuck Hint, MD;  Location: [facility] INVASIVE CV LAB;  Service: Cardiovascular;  Laterality: Left;  arm   TEE WITHOUT CARDIOVERSION N/A 06/22/2018   Procedure: TRANSESOPHAGEAL ECHOCARDIOGRAM (TEE);  Surgeon: Jake Bathe, MD;  Location: [facility] ENDOSCOPY;  Service: Cardiovascular;  Laterality: N/A;    Family History  Problem Relation Age of Onset   Diabetes Mother    Diabetes Father       Social History   Socioeconomic History   Marital status: Single    Spouse name: Not on file   Number of children: Not on file   Years of education: Not on file   Highest education level: Not on file  Occupational History   Not on file  Tobacco Use   Smoking status: Never   Smokeless tobacco: Never  Vaping Use   Vaping status: Never Used  Substance and Sexual Activity   Alcohol use: No   Drug use: No   Sexual activity: Not Currently    Comment: declined condoms  Other Topics Concern   Not on file  Social History Narrative   ** Merged History Encounter **       Social Determinants of Health   Financial Resource Strain: Not on file  Food Insecurity: Not on file  Transportation Needs: Not on file  Physical Activity: Not on file  Stress: Not on file  Social Connections: Not on file    No Known Allergies   Current Outpatient  Medications:    bictegravir-emtricitabine-tenofovir AF (BIKTARVY) 50-200-25 MG TABS tablet, Take 1 tablet by mouth daily., Disp: 30 tablet, Rfl: 11   Blood Glucose Monitoring Suppl (TRUE METRIX METER) w/Device KIT, Use kit to check glucose 3 (three) times daily., Disp: 1 kit, Rfl: 0   doxycycline (VIBRAMYCIN) 100 MG capsule, Take 1 capsule (100 mg total) by mouth 2 (two) times daily., Disp: 20 capsule, Rfl: 0   erythromycin ophthalmic ointment, Place into the left eye 2 (two) times daily., Disp: 3.5 g, Rfl: 0   erythromycin ophthalmic ointment, Apply a small amount into affected (left eyelid) eye twice a day for 10 days, Disp: 3.5 g, Rfl: 0   glucose blood (TRUE METRIX BLOOD GLUCOSE TEST) test strip, use 1 test strip to check  glucose 3 (three) times daily., Disp: 100 each, Rfl: 2   insulin aspart protamine - aspart (NOVOLOG 70/30 FLEXPEN) (70-30) 100 UNIT/ML FlexPen, Inject 20 Units into the skin 2 (two) times daily., Disp: 15 mL, Rfl: 11   insulin NPH-regular Human (HUMULIN 70/30) (70-30) 100 UNIT/ML injection, Inject 20 units subcutaneously twice daily with meals, Disp: 20 mL, Rfl: 5   insulin NPH-regular Human (HUMULIN 70/30) (70-30) 100 UNIT/ML injection, Inject 15 unit subcutaneously twice a day as directed before meals, Disp: 10 mL, Rfl: 5   Insulin Pen Needle 31G X 5 MM MISC, Utilize as needed to inject insulin sq as recommended (BID), Disp: 100 each, Rfl: 5   Insulin Syringe-Needle U-100 (BD INSULIN SYRINGE ULTRAFINE) 31G X 15/64" 0.5 ML MISC, 1 each by Does not apply route 2 (two) times daily., Disp: 100 each, Rfl: 5   midodrine (PROAMATINE) 10 MG tablet, Take 1 tablet (10 mg total) by mouth 3 (three) times daily with meals., Disp: 90 tablet, Rfl: 11   mupirocin ointment (BACTROBAN) 2 %, Place 1 Application into the nose 2 (two) times daily., Disp: 22 g, Rfl: 0   Pitavastatin Magnesium 4 MG TABS, Take 4 mg by mouth daily. (Patient not taking: Reported on 08/01/2022), Disp: 30 tablet, Rfl: 11    prednisoLONE acetate (PRED FORTE) 1 % ophthalmic suspension, Place 1 drop into the left eye 3 (three) times daily., Disp: 5 mL, Rfl: 0   TRUEplus Lancets 28G MISC, Use to test 3 (three) times daily., Disp: 100 each, Rfl: 2\  Review of Systems  Constitutional:  Negative for activity change, appetite change, chills, diaphoresis, fatigue, fever and unexpected weight change.  HENT:  Negative for congestion, rhinorrhea, sinus pressure, sneezing, sore throat and trouble swallowing.   Eyes:  Negative for photophobia and visual disturbance.  Respiratory:  Negative for cough, chest tightness, shortness of breath, wheezing and stridor.   Cardiovascular:  Negative for chest pain, palpitations and leg swelling.  Gastrointestinal:  Negative for abdominal distention, abdominal pain, anal bleeding, blood in stool, constipation, diarrhea, nausea and vomiting.  Genitourinary:  Negative for difficulty urinating, dysuria, flank pain and hematuria.  Musculoskeletal:  Negative for arthralgias, back pain, gait problem, joint swelling and myalgias.  Skin:  Negative for color change, pallor, rash and wound.  Neurological:  Negative for dizziness, tremors, weakness and light-headedness.  Hematological:  Negative for adenopathy. Does not bruise/bleed easily.  Psychiatric/Behavioral:  Negative for agitation, behavioral problems, confusion, decreased concentration, dysphoric mood and sleep disturbance.        Objective:   Physical Exam Constitutional:      Appearance: He is well-developed.  HENT:     Head: Normocephalic and atraumatic.  Eyes:     Conjunctiva/sclera: Conjunctivae normal.  Cardiovascular:     Rate and Rhythm: Normal rate and regular rhythm.  Pulmonary:     Effort: Pulmonary effort is normal. No respiratory distress.     Breath sounds: No wheezing.  Abdominal:     General: There is no distension.     Palpations: Abdomen is soft.  Musculoskeletal:        General: No tenderness. Normal range of  motion.     Cervical back: Normal range of motion and neck supple.  Skin:    General: Skin is warm and dry.     Coloration: Skin is not pale.     Findings: No erythema or rash.  Neurological:     General: No focal deficit present.     Mental Status: He  is alert and oriented to person, place, and time.  Psychiatric:        Mood and Affect: Mood normal.        Behavior: Behavior normal.        Thought Content: Thought content normal.        Judgment: Judgment normal.    Left eye December 18, 2022:      Assessment & Plan:   Corneal ulcer with Descemetocele v rupture in need of enucleation:  He does not want to be seen by ophthalmology. Hopefully infection of this site does not become a larger problem  HIV disease:  I will add order HIV viral load CD4 count, RPR  and I will continue  Nakul Hernandez Hernandez's Biktarvy prescription    Hyperlipidemia;   Diabetes mellitus:he is on insulin  Hyperlipidemia: recheck lipid panel and will continue pitavastatin  Lipid Panel     Component Value Date/Time   CHOL 121 05/29/2022 0225   TRIG 182 (H) 05/29/2022 0225   HDL 39 (L) 05/29/2022 0225   CHOLHDL 3.1 05/29/2022 0225   VLDL 13 06/13/2014 1406   LDLCALC 56 05/29/2022 0225

## 2023-01-14 ENCOUNTER — Telehealth: Payer: Self-pay | Admitting: Family Medicine

## 2023-01-14 NOTE — Telephone Encounter (Signed)
[facility] is calling in because Novolog isn't available and the insurance's preferred replacement is Humolog Mix 75/25. Pharmacist of requesting a prescription for the Humolog pens be sent over to the Pharmacy [facility] [phone] @ [address] - [place], [place] - [address]

## 2023-01-17 ENCOUNTER — Other Ambulatory Visit: Payer: Self-pay | Admitting: Family Medicine

## 2023-01-17 MED ORDER — INSULIN LISPRO PROT & LISPRO (75-25 MIX) 100 UNIT/ML KWIKPEN: 20.0000 [IU] | PEN_INJECTOR | Freq: Two times a day (BID) | SUBCUTANEOUS | 11 refills | Status: DC

## 2023-01-21 ENCOUNTER — Other Ambulatory Visit: Payer: Self-pay

## 2023-01-27 ENCOUNTER — Other Ambulatory Visit: Payer: Self-pay

## 2023-01-27 MED ORDER — MIDODRINE HCL 10 MG PO TABS
10.0000 mg | ORAL_TABLET | ORAL | 3 refills | Status: DC
  Filled 2023-01-27: qty 60, 70d supply, fill #0

## 2023-01-27 MED ORDER — LOPERAMIDE HCL 2 MG PO TABS
2.0000 mg | ORAL_TABLET | ORAL | 0 refills | Status: DC
  Filled 2023-01-27: qty 24, 6d supply, fill #0

## 2023-01-29 ENCOUNTER — Ambulatory Visit (INDEPENDENT_AMBULATORY_CARE_PROVIDER_SITE_OTHER): Payer: Self-pay | Admitting: Family Medicine

## 2023-01-29 ENCOUNTER — Encounter: Payer: Self-pay | Admitting: Family Medicine

## 2023-01-29 VITALS — BP 120/78 | HR 91 | Temp 98.0°F | Resp 16 | Wt 106.0 lb

## 2023-01-29 DIAGNOSIS — E1122 Type 2 diabetes mellitus with diabetic chronic kidney disease: Secondary | ICD-10-CM

## 2023-01-29 DIAGNOSIS — Z794 Long term (current) use of insulin: Secondary | ICD-10-CM

## 2023-01-29 DIAGNOSIS — N186 End stage renal disease: Secondary | ICD-10-CM

## 2023-01-29 DIAGNOSIS — Z21 Asymptomatic human immunodeficiency virus [HIV] infection status: Secondary | ICD-10-CM

## 2023-01-29 DIAGNOSIS — I10 Essential (primary) hypertension: Secondary | ICD-10-CM

## 2023-01-29 DIAGNOSIS — Z23 Encounter for immunization: Secondary | ICD-10-CM

## 2023-01-29 DIAGNOSIS — B20 Human immunodeficiency virus [HIV] disease: Secondary | ICD-10-CM

## 2023-01-29 LAB — POCT GLYCOSYLATED HEMOGLOBIN (HGB A1C): Hemoglobin A1C: 8.5 % — AB (ref 4.0–5.6)

## 2023-01-29 MED ORDER — INSULIN SYRINGES (DISPOSABLE) U-100 0.5 ML MISC: 5 refills | Status: DC

## 2023-01-29 MED ORDER — INSULIN ASPART PROT & ASPART (70-30 MIX) 100 UNIT/ML ~~LOC~~ SUSP: 20.0000 [IU] | Freq: Two times a day (BID) | SUBCUTANEOUS | 11 refills | Status: DC

## 2023-01-30 ENCOUNTER — Ambulatory Visit: Payer: Self-pay | Admitting: Orthopedic Surgery

## 2023-01-30 DIAGNOSIS — Z89511 Acquired absence of right leg below knee: Secondary | ICD-10-CM

## 2023-01-30 DIAGNOSIS — Z89512 Acquired absence of left leg below knee: Secondary | ICD-10-CM

## 2023-02-03 ENCOUNTER — Encounter: Payer: Self-pay | Admitting: Orthopedic Surgery

## 2023-02-03 ENCOUNTER — Other Ambulatory Visit: Payer: Self-pay

## 2023-02-03 ENCOUNTER — Encounter: Payer: Self-pay | Admitting: Family Medicine

## 2023-02-03 NOTE — Progress Notes (Signed)
Established Patient Office Visit  Subjective    Patient ID: Alvin Daniels, male    DOB: [birth year]  Age: 52 y.o. MRN: [id number]  CC:  Chief Complaint  Patient presents with   Medical Management of Chronic Issues    HPI Alvin Daniels presents for follow up of chronic med issues. Patient denies acute complaints. This visit was aided by an interpreter.   Outpatient Encounter Medications as of 01/29/2023  Medication Sig   bictegravir-emtricitabine-tenofovir AF (BIKTARVY) 50-200-25 MG TABS tablet Take 1 tablet by mouth daily.   Blood Glucose Monitoring Suppl (TRUE METRIX METER) w/Device KIT Use kit to check glucose 3 (three) times daily.   doxycycline (VIBRAMYCIN) 100 MG capsule Take 1 capsule (100 mg total) by mouth 2 (two) times daily.   erythromycin ophthalmic ointment Place into the left eye 2 (two) times daily.   glucose blood (TRUE METRIX BLOOD GLUCOSE TEST) test strip use 1 test strip to check glucose 3 (three) times daily.   insulin aspart protamine - aspart (NOVOLOG 70/30 FLEXPEN) (70-30) 100 UNIT/ML FlexPen Inject 20 Units into the skin 2 (two) times daily.   insulin aspart protamine- aspart (NOVOLOG MIX 70/30) (70-30) 100 UNIT/ML injection Inject 0.2 mLs (20 Units total) into the skin 2 (two) times daily with a meal.   Insulin Lispro Prot & Lispro (HUMALOG MIX 75/25 KWIKPEN) (75-25) 100 UNIT/ML Kwikpen Inject 20 Units into the skin 2 (two) times daily.   insulin NPH-regular Human (HUMULIN 70/30) (70-30) 100 UNIT/ML injection Inject 20 units subcutaneously twice daily with meals   insulin NPH-regular Human (HUMULIN 70/30) (70-30) 100 UNIT/ML injection Inject 15 unit subcutaneously twice a day as directed before meals   Insulin Pen Needle 31G X 5 MM MISC Utilize as needed to inject insulin sq as recommended (BID)   Insulin Syringe-Needle U-100 (BD INSULIN SYRINGE ULTRAFINE) 31G X 15/64" 0.5 ML MISC 1 each by Does not apply route 2 (two) times  daily.   Insulin Syringes, Disposable, U-100 0.5 ML MISC Utilize  as needed to inject insulin   iron sucrose in sodium chloride 0.9 % 100 mL Iron Sucrose (Venofer)   loperamide (IMODIUM A-D) 2 MG tablet Take 1 tablet by mouth as directed for diarrhea   midodrine (PROAMATINE) 10 MG tablet Take 1 tablet by mouth three times a week as directed. Take 1 tablet 30 minutes prior to HD and 1 tab during treatment.   mupirocin ointment (BACTROBAN) 2 % Place 1 Application into the nose 2 (two) times daily.   Pitavastatin Magnesium 4 MG TABS Take 1 tablet (4 mg total) by mouth daily.   prednisoLONE acetate (PRED FORTE) 1 % ophthalmic suspension Place 1 drop into the left eye 3 (three) times daily.   TRUEplus Lancets 28G MISC Use to test 3 (three) times daily.   [DISCONTINUED] bictegravir-emtricitabine-tenofovir AF (BIKTARVY) 50-200-25 MG TABS tablet Take 1 tablet by mouth daily.   No facility-administered encounter medications on file as of 01/29/2023.    Past Medical History:  Diagnosis Date   Acute bilateral low back pain 01/03/2021   AIDS ([name]) 11/22/2014   Back pain 06/09/2019   BKA stump complication ([name]) 06/09/2019   Chronic diarrhea    Chronic hepatitis C without hepatic coma ([name]) 11/22/2014   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[name])    Right   Corneal ulcer 12/18/2022   Descemetocele of left eye 12/18/2022   Diabetic neuropathy ([name])    ESRD (end stage renal disease) on dialysis ([name])    "TTS;  don't remember street name" (05/03/2014)   Hepatitis C    HIV INFECTION 06/27/2010   Qualifier: Diagnosis of  By: Jennye Moccasin CMA ( AAMA), Jacqueline     Hypotension 06/02/2012   Metabolic bone disease 06/18/2012   MRSA infection    Normocytic anemia 06/17/2012   Pancreatitis    Pressure ulcer of BKA stump, stage 2 ([name]) 11/22/2014   Renal disorder    Renal insufficiency    Severe protein-calorie malnutrition ([name]) 06/17/2012   Uncontrolled diabetes mellitus with complications 06/27/2010    Annotation: uncontrolled Qualifier: Diagnosis of  By: Jennye Moccasin CMA Duncan Dull), Jacqueline     Vaccine counseling 05/29/2022    Past Surgical History:  Procedure Laterality Date   AMPUTATION Left 04/20/2014   Procedure: 3rd toe amputation, 4th Toe Amputation,  5th Toe Amputation;  Surgeon: Nadara Mustard, MD;  [facility];  Service: Orthopedics;  Laterality: Left;   AMPUTATION Left 05/02/2014   Procedure: Midfoot Amputation;  Surgeon: Nadara Mustard, MD;  Location: [facility] OR;  Service: Orthopedics;  Laterality: Left;   AMPUTATION Left 06/17/2014   Procedure: AMPUTATION BELOW KNEE;  Surgeon: Nadara Mustard, MD;  Location: [facility] OR;  Service: Orthopedics;  Laterality: Left;   AMPUTATION Right 09/04/2018   Procedure: RIGHT BELOW KNEE AMPUTATION;  Surgeon: Nadara Mustard, MD;  Location: [facility] OR;  Service: Orthopedics;  Laterality: Right;  RIGHT BELOW KNEE AMPUTATION   AV FISTULA PLACEMENT Left    AV FISTULA PLACEMENT Left 05/10/2016   Procedure: Creation Left Arm Brachiocephalic Arteriovenous Fistula and Ligation of Radiocephalic Fistula;  Surgeon: Chuck Hint, MD;  Location: [facility] OR;  Service: Vascular;  Laterality: Left;   FEMUR IM NAIL Left 08/17/2016   Procedure: INTRAMEDULLARY (IM) RETROGRADE FEMORAL NAILING;  Surgeon: Eldred Manges, MD;  [facility]: [facility] OR;  Service: Orthopedics;  Laterality: Left;   FOOT AMPUTATION THROUGH ANKLE Left 12/'21/2015   midfoot   IR GENERIC HISTORICAL Left 05/01/2016   IR THROMBECTOMY AV FISTULA W/THROMBOLYSIS/PTA INC/SHUNT/IMG LEFT 05/01/2016 Oley Balm, MD [facility]   IR GENERIC HISTORICAL  05/01/2016   IR US GUIDE VASC ACCESS LEFT 05/01/2016 Oley Balm, MD [facility]   IR GENERIC HISTORICAL  05/07/2016   IR FLUORO GUIDE CV LINE RIGHT 05/07/2016 Gilmer Mor, DO [facility]   IR GENERIC HISTORICAL  05/07/2016   IR US GUIDE VASC ACCESS RIGHT 05/07/2016 Gilmer Mor, DO [facility]   IR GENERIC HISTORICAL  05/22/2016   IR US GUIDE VASC ACCESS RIGHT  05/22/2016 Berdine Dance, MD [facility]   IR GENERIC HISTORICAL  05/22/2016   IR FLUORO GUIDE CV LINE RIGHT 05/22/2016 Berdine Dance, MD [facility]   IR REMOVAL TUN CV CATH W/O [facility]  08/21/2016   PERIPHERAL VASCULAR CATHETERIZATION Left 05/09/2016   Procedure: A/V Fistulagram;  Surgeon: Chuck Hint, MD;  Location: [facility] INVASIVE CV LAB;  Service: Cardiovascular;  Laterality: Left;  arm   TEE WITHOUT CARDIOVERSION N/A 06/22/2018   Procedure: TRANSESOPHAGEAL ECHOCARDIOGRAM (TEE);  Surgeon: Jake Bathe, MD;  Location: [facility] ENDOSCOPY;  Service: Cardiovascular;  Laterality: N/A;    Family History  Problem Relation Age of Onset   Diabetes Mother    Diabetes Father     Social History   Socioeconomic History   Marital status: Single    Spouse name: Not on file   Number of children: Not on file   Years of education: Not on file   Highest education level: Not on file  Occupational History   Not  on file  Tobacco Use   Smoking status: Never   Smokeless tobacco: Never  Vaping Use   Vaping status: Never Used  Substance and Sexual Activity   Alcohol use: No   Drug use: No   Sexual activity: Not Currently    Comment: declined condoms  Other Topics Concern   Not on file  Social History Narrative   ** Merged History Encounter **       Social Determinants of Health   Financial Resource Strain: Low Risk  (01/29/2023)   Overall Financial Resource Strain (CARDIA)    Difficulty of Paying Living Expenses: Not very hard  Food Insecurity: No Food Insecurity (01/29/2023)   Hunger Vital Sign    Worried About Running Out of Food in the Last Year: Never true    Ran Out of Food in the Last Year: Never true  Transportation Needs: Unmet Transportation Needs (01/29/2023)   PRAPARE - Transportation    Lack of Transportation (Medical): Yes    Lack of Transportation (Non-Medical): Yes  Physical Activity: Inactive (01/29/2023)   Exercise Vital Sign    Days of Exercise per Week: 0 days     Minutes of Exercise per Session: 0 min  Stress: No Stress Concern Present (01/29/2023)   [employer] of Occupational Health - Occupational Stress Questionnaire    Feeling of Stress : Not at all  Social Connections: Moderately Isolated (01/29/2023)   Social Connection and Isolation Panel [NHANES]    Frequency of Communication with Friends and Family: More than three times a week    Frequency of Social Gatherings with Friends and Family: More than three times a week    Attends Religious Services: More than 4 times per year    Active Member of [employer] or Organizations: No    Attends Banker Meetings: Never    Marital Status: Never married  Intimate Partner Violence: Not At Risk (01/29/2023)   Humiliation, Afraid, Rape, and Kick questionnaire    Fear of Current or Ex-Partner: No    Emotionally Abused: No    Physically Abused: No    Sexually Abused: No    Review of Systems  All other systems reviewed and are negative.       Objective    BP 120/78   Pulse 91   Temp 98 F (36.7 C) (Oral)   Resp 16   Wt 106 lb (48.1 kg)   SpO2 97%   BMI 18.78 kg/m   Physical Exam Vitals and nursing note reviewed.  Constitutional:      General: He is not in acute distress. Cardiovascular:     Rate and Rhythm: Normal rate and regular rhythm.  Pulmonary:     Effort: Pulmonary effort is normal.     Breath sounds: Normal breath sounds.  Abdominal:     Palpations: Abdomen is soft.     Tenderness: There is no abdominal tenderness.  Musculoskeletal:     Comments: Utilizing walker     Right Lower Extremity: Right leg is amputated below knee.     Left Lower Extremity: Left leg is amputated below knee.  Neurological:     General: No focal deficit present.     Mental Status: He is alert and oriented to person, place, and time.         Assessment & Plan:   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) -     POCT glycosylated hemoglobin (Hb A1C)  Encounter  for long-term (current) use of insulin ([name])  Essential hypertension  HIV infection, unspecified symptom status ([name])  Encounter for immunization -     Flu vaccine trivalent PF, 6mos and older(Flulaval,Afluria,Fluarix,Fluzone)  Other orders -     Insulin Aspart Prot & Aspart; Inject 0.2 mLs (20 Units total) into the skin 2 (two) times daily with a meal.  Dispense: 10 mL; Refill: 11 -     Insulin Syringes (Disposable); Utilize  as needed to inject insulin  Dispense: 100 each; Refill: 5   Improved A1c but yet above goal. Continue .   No follow-ups on file.   Tommie Raymond, MD
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
## 2023-02-17 ENCOUNTER — Other Ambulatory Visit: Payer: Self-pay

## 2023-02-17 MED ORDER — MIDODRINE HCL 10 MG PO TABS
10.0000 mg | ORAL_TABLET | ORAL | 3 refills | Status: DC
  Filled 2023-02-17: qty 60, 70d supply, fill #0

## 2023-04-02 ENCOUNTER — Other Ambulatory Visit: Payer: Self-pay

## 2023-04-02 MED ORDER — MIDODRINE HCL 10 MG PO TABS
ORAL_TABLET | ORAL | 3 refills | Status: DC
  Filled 2023-04-02: qty 60, 90d supply, fill #0

## 2023-04-07 ENCOUNTER — Other Ambulatory Visit: Payer: Self-pay

## 2023-04-07 MED ORDER — MIDODRINE HCL 10 MG PO TABS
10.0000 mg | ORAL_TABLET | ORAL | 0 refills | Status: DC
  Filled 2023-04-07: qty 40, 93d supply, fill #0

## 2023-04-08 ENCOUNTER — Other Ambulatory Visit: Payer: Self-pay

## 2023-04-08 MED ORDER — MIDODRINE HCL 10 MG PO TABS
10.0000 mg | ORAL_TABLET | ORAL | 3 refills | Status: DC
  Filled 2023-04-08: qty 36, 84d supply, fill #0

## 2023-04-09 ENCOUNTER — Other Ambulatory Visit (HOSPITAL_COMMUNITY): Payer: Self-pay | Admitting: Nephrology

## 2023-04-09 ENCOUNTER — Other Ambulatory Visit: Payer: Self-pay

## 2023-04-09 DIAGNOSIS — N186 End stage renal disease: Secondary | ICD-10-CM

## 2023-04-16 ENCOUNTER — Other Ambulatory Visit: Payer: Self-pay

## 2023-04-16 ENCOUNTER — Other Ambulatory Visit: Payer: Self-pay | Admitting: Radiology

## 2023-04-16 ENCOUNTER — Encounter: Payer: Self-pay | Admitting: Infectious Disease

## 2023-04-16 ENCOUNTER — Ambulatory Visit (INDEPENDENT_AMBULATORY_CARE_PROVIDER_SITE_OTHER): Payer: Self-pay | Admitting: Infectious Disease

## 2023-04-16 VITALS — BP 115/80 | HR 91 | Temp 97.9°F | Wt 108.0 lb

## 2023-04-16 DIAGNOSIS — Z992 Dependence on renal dialysis: Secondary | ICD-10-CM

## 2023-04-16 DIAGNOSIS — I959 Hypotension, unspecified: Secondary | ICD-10-CM

## 2023-04-16 DIAGNOSIS — N186 End stage renal disease: Secondary | ICD-10-CM

## 2023-04-16 DIAGNOSIS — E1122 Type 2 diabetes mellitus with diabetic chronic kidney disease: Secondary | ICD-10-CM

## 2023-04-16 DIAGNOSIS — Z89512 Acquired absence of left leg below knee: Secondary | ICD-10-CM

## 2023-04-16 DIAGNOSIS — Z7185 Encounter for immunization safety counseling: Secondary | ICD-10-CM

## 2023-04-16 DIAGNOSIS — B20 Human immunodeficiency virus [HIV] disease: Secondary | ICD-10-CM

## 2023-04-16 DIAGNOSIS — Z89511 Acquired absence of right leg below knee: Secondary | ICD-10-CM

## 2023-04-16 DIAGNOSIS — E785 Hyperlipidemia, unspecified: Secondary | ICD-10-CM

## 2023-04-16 MED ORDER — BIKTARVY 50-200-25 MG PO TABS
1.0000 | ORAL_TABLET | Freq: Every day | ORAL | 11 refills | Status: DC
Start: 2023-04-16 — End: 2024-03-03

## 2023-04-16 MED ORDER — MIDODRINE HCL 10 MG PO TABS
10.0000 mg | ORAL_TABLET | ORAL | 11 refills | Status: DC
  Filled 2023-04-16: qty 30, 35d supply, fill #0
  Filled 2023-05-23: qty 30, 35d supply, fill #1
  Filled 2023-06-17: qty 30, 35d supply, fill #2
  Filled 2023-07-21: qty 30, 35d supply, fill #3

## 2023-04-16 MED ORDER — PITAVASTATIN MAGNESIUM 4 MG PO TABS: 4.0000 mg | ORAL_TABLET | Freq: Every day | ORAL | 11 refills | Status: DC

## 2023-04-16 NOTE — Progress Notes (Signed)
Subjective:    Chief complaint: follow-up for HIV disease on medications   Patient ID: Alvin Daniels, male    DOB: Apr 03, 1971, 52 y.o.   MRN: [id number]  HPI  Discussed the use of AI scribe software for clinical note transcription with the patient, who gave verbal consent to proceed.  History of Present Illness   The patient, with a history of bilateral lower extremity amputations and HIV, presents for a follow-up visit. They report that their condition has been stable, if not better. They confirm that they are using the correct prostheses for their legs. Previously, they had wounds related to the prostheses, but these have now completely healed. The patient is currently on Biktarvy for HIV management, and their most recent blood work showed that the HIV was undetectable and their CD4 count was 381, indicating good immune health.  The patient also has diabetes, for which they are on insulin. They report needing a medication to elevate their blood pressure, especially on days when they have dialysis. They have been having trouble getting this medication from the pharmacy. The patient is also on pitavastatin 4mg  for cholesterol management.  The patient declined both the flu and COVID vaccines, despite the doctor's recommendation due to their higher risk status with diabetes and dialysis.       Past Medical History:  Diagnosis Date   Acute bilateral low back pain 01/03/2021   AIDS ([name]) 11/22/2014   Back pain 06/09/2019   BKA stump complication ([name]) 06/09/2019   Chronic diarrhea    Chronic hepatitis C without hepatic coma ([name]) 11/22/2014   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[name])    Right   Corneal ulcer 12/18/2022   Descemetocele of left eye 12/18/2022   Diabetic neuropathy ([name])    ESRD (end stage renal disease) on dialysis ([name])    "TTS; don't remember street name" (05/03/2014)   Hepatitis C    HIV INFECTION 06/27/2010   Qualifier: Diagnosis of  By: Jennye Moccasin CMA  ( AAMA), Jacqueline     Hypotension 06/02/2012   Metabolic bone disease 06/18/2012   MRSA infection    Normocytic anemia 06/17/2012   Pancreatitis    Pressure ulcer of BKA stump, stage 2 ([name]) 11/22/2014   Renal disorder    Renal insufficiency    Severe protein-calorie malnutrition ([name]) 06/17/2012   Uncontrolled diabetes mellitus with complications 06/27/2010   Annotation: uncontrolled Qualifier: Diagnosis of  By: Jennye Moccasin CMA Duncan Dull), Jacqueline     Vaccine counseling 05/29/2022    Past Surgical History:  Procedure Laterality Date   AMPUTATION Left 04/20/2014   Procedure: 3rd toe amputation, 4th Toe Amputation,  5th Toe Amputation;  Surgeon: Nadara Mustard, MD;  Location: [facility] OR;  Service: Orthopedics;  Laterality: Left;   AMPUTATION Left 05/02/2014   Procedure: Midfoot Amputation;  Surgeon: Nadara Mustard, MD;  Location: [facility] OR;  Service: Orthopedics;  Laterality: Left;   AMPUTATION Left 06/17/2014   Procedure: AMPUTATION BELOW KNEE;  Surgeon: Nadara Mustard, MD;  Location: [facility] OR;  Service: Orthopedics;  Laterality: Left;   AMPUTATION Right 09/04/2018   Procedure: RIGHT BELOW KNEE AMPUTATION;  Surgeon: Nadara Mustard, MD;  Location: [facility] OR;  Service: Orthopedics;  Laterality: Right;  RIGHT BELOW KNEE AMPUTATION   AV FISTULA PLACEMENT Left    AV FISTULA PLACEMENT Left 05/10/2016   Procedure: Creation Left Arm Brachiocephalic Arteriovenous Fistula and Ligation of Radiocephalic Fistula;  Surgeon: Chuck Hint, MD;  Location: [facility] OR;  Service: Vascular;  Laterality:  Left;   FEMUR IM NAIL Left 08/17/2016   Procedure: INTRAMEDULLARY (IM) RETROGRADE FEMORAL NAILING;  Surgeon: Eldred Manges, MD;  Location: [facility] OR;  Service: Orthopedics;  Laterality: Left;   FOOT AMPUTATION THROUGH ANKLE Left 12/'21/2015   midfoot   IR GENERIC HISTORICAL Left 05/01/2016   IR THROMBECTOMY AV FISTULA W/THROMBOLYSIS/PTA INC/SHUNT/IMG LEFT 05/01/2016 Oley Balm, MD [facility]   IR GENERIC HISTORICAL   05/01/2016   IR US GUIDE VASC ACCESS LEFT 05/01/2016 Oley Balm, MD [facility]   IR GENERIC HISTORICAL  05/07/2016   IR FLUORO GUIDE CV LINE RIGHT 05/07/2016 Gilmer Mor, DO [facility]   IR GENERIC HISTORICAL  05/07/2016   IR US GUIDE VASC ACCESS RIGHT 05/07/2016 Gilmer Mor, DO [facility]   IR GENERIC HISTORICAL  05/22/2016   IR US GUIDE VASC ACCESS RIGHT 05/22/2016 Berdine Dance, MD [facility]   IR GENERIC HISTORICAL  05/22/2016   IR FLUORO GUIDE CV LINE RIGHT 05/22/2016 Berdine Dance, MD [facility]   IR REMOVAL TUN CV CATH W/O [facility]  08/21/2016   PERIPHERAL VASCULAR CATHETERIZATION Left 05/09/2016   Procedure: A/V Fistulagram;  Surgeon: Chuck Hint, MD;  Location: [facility] INVASIVE CV LAB;  Service: Cardiovascular;  Laterality: Left;  arm   TEE WITHOUT CARDIOVERSION N/A 06/22/2018   Procedure: TRANSESOPHAGEAL ECHOCARDIOGRAM (TEE);  Surgeon: Jake Bathe, MD;  Location: [facility] ENDOSCOPY;  Service: Cardiovascular;  Laterality: N/A;    Family History  Problem Relation Age of Onset   Diabetes Mother    Diabetes Father       Social History   Socioeconomic History   Marital status: Single    Spouse name: Not on file   Number of children: Not on file   Years of education: Not on file   Highest education level: Not on file  Occupational History   Not on file  Tobacco Use   Smoking status: Never   Smokeless tobacco: Never  Vaping Use   Vaping status: Never Used  Substance and Sexual Activity   Alcohol use: No   Drug use: No   Sexual activity: Not Currently    Comment: declined condoms  Other Topics Concern   Not on file  Social History Narrative   ** Merged History Encounter **       Social Determinants of Health   Financial Resource Strain: Low Risk  (01/29/2023)   Overall Financial Resource Strain (CARDIA)    Difficulty of Paying Living Expenses: Not very hard  Food Insecurity: No Food Insecurity (01/29/2023)   Hunger Vital Sign    Worried About  Running Out of Food in the Last Year: Never true    Ran Out of Food in the Last Year: Never true  Transportation Needs: Unmet Transportation Needs (01/29/2023)   PRAPARE - Transportation    Lack of Transportation (Medical): Yes    Lack of Transportation (Non-Medical): Yes  Physical Activity: Inactive (01/29/2023)   Exercise Vital Sign    Days of Exercise per Week: 0 days    Minutes of Exercise per Session: 0 min  Stress: No Stress Concern Present (01/29/2023)   [employer] of Occupational Health - Occupational Stress Questionnaire    Feeling of Stress : Not at all  Social Connections: Moderately Isolated (01/29/2023)   Social Connection and Isolation Panel [NHANES]    Frequency of Communication with Friends and Family: More than three times a week    Frequency of Social Gatherings with Friends and Family: More than three times a week  Attends Religious Services: More than 4 times per year    Active Member of Clubs or Organizations: No    Attends Banker Meetings: Never    Marital Status: Never married    No Known Allergies   Current Outpatient Medications:    bictegravir-emtricitabine-tenofovir AF (BIKTARVY) 50-200-25 MG TABS tablet, Take 1 tablet by mouth daily., Disp: 30 tablet, Rfl: 11   Blood Glucose Monitoring Suppl (TRUE METRIX METER) w/Device KIT, Use kit to check glucose 3 (three) times daily., Disp: 1 kit, Rfl: 0   doxycycline (VIBRAMYCIN) 100 MG capsule, Take 1 capsule (100 mg total) by mouth 2 (two) times daily., Disp: 20 capsule, Rfl: 0   erythromycin ophthalmic ointment, Place into the left eye 2 (two) times daily., Disp: 3.5 g, Rfl: 0   glucose blood (TRUE METRIX BLOOD GLUCOSE TEST) test strip, use 1 test strip to check glucose 3 (three) times daily., Disp: 100 each, Rfl: 2   insulin aspart protamine - aspart (NOVOLOG 70/30 FLEXPEN) (70-30) 100 UNIT/ML FlexPen, Inject 20 Units into the skin 2 (two) times daily., Disp: 15 mL, Rfl: 11   insulin aspart  protamine- aspart (NOVOLOG MIX 70/30) (70-30) 100 UNIT/ML injection, Inject 0.2 mLs (20 Units total) into the skin 2 (two) times daily with a meal., Disp: 10 mL, Rfl: 11   Insulin Lispro Prot & Lispro (HUMALOG MIX 75/25 KWIKPEN) (75-25) 100 UNIT/ML Kwikpen, Inject 20 Units into the skin 2 (two) times daily., Disp: 15 mL, Rfl: 11   insulin NPH-regular Human (HUMULIN 70/30) (70-30) 100 UNIT/ML injection, Inject 20 units subcutaneously twice daily with meals, Disp: 20 mL, Rfl: 5   insulin NPH-regular Human (HUMULIN 70/30) (70-30) 100 UNIT/ML injection, Inject 15 unit subcutaneously twice a day as directed before meals, Disp: 10 mL, Rfl: 5   Insulin Pen Needle 31G X 5 MM MISC, Utilize as needed to inject insulin sq as recommended (BID), Disp: 100 each, Rfl: 5   Insulin Syringe-Needle U-100 (BD INSULIN SYRINGE ULTRAFINE) 31G X 15/64" 0.5 ML MISC, 1 each by Does not apply route 2 (two) times daily., Disp: 100 each, Rfl: 5   Insulin Syringes, Disposable, U-100 0.5 ML MISC, Utilize  as needed to inject insulin, Disp: 100 each, Rfl: 5   iron sucrose in sodium chloride 0.9 % 100 mL, Iron Sucrose (Venofer), Disp: , Rfl:    loperamide (IMODIUM A-D) 2 MG tablet, Take 1 tablet by mouth as directed for diarrhea, Disp: 24 tablet, Rfl: 0   midodrine (PROAMATINE) 10 MG tablet, Take 1 tablet by mouth three times a week as directed. Take 1 tablet 30 minutes prior to HD and 1 tab during treatment., Disp: 60 tablet, Rfl: 3   midodrine (PROAMATINE) 10 MG tablet, Take 1 tablet by mouth three times a week as directed. Take 1 tablet 30 minutes prior to HD and 1 tablet during treatment, Disp: 60 tablet, Rfl: 3   midodrine (PROAMATINE) 10 MG tablet, Take 1 tablet by mouth three times a week as directed Take 1 tablet 30 minutes prior to HD and 1 tab during treatment, Disp: 60 tablet, Rfl: 3   midodrine (PROAMATINE) 10 MG tablet, Take 1 tablet (10 mg total) by mouth every Monday, Wednesday, and Friday before hemodialysis., Disp: 60  tablet, Rfl: 0   midodrine (PROAMATINE) 10 MG tablet, Take 1 tablet (10 mg total) by mouth 3 (three) times a week. Take 1 tablet 30 minutes prior to HD and 1 tablet during treatment, Disp: 60 tablet, Rfl: 3  mupirocin ointment (BACTROBAN) 2 %, Place 1 Application into the nose 2 (two) times daily., Disp: 22 g, Rfl: 0   Pitavastatin Magnesium 4 MG TABS, Take 1 tablet (4 mg total) by mouth daily., Disp: 30 tablet, Rfl: 11   prednisoLONE acetate (PRED FORTE) 1 % ophthalmic suspension, Place 1 drop into the left eye 3 (three) times daily., Disp: 5 mL, Rfl: 0   TRUEplus Lancets 28G MISC, Use to test 3 (three) times daily., Disp: 100 each, Rfl: 2    Review of Systems  Constitutional:  Negative for activity change, appetite change, chills, diaphoresis, fatigue, fever and unexpected weight change.  HENT:  Negative for congestion, rhinorrhea, sinus pressure, sneezing, sore throat and trouble swallowing.   Eyes:  Negative for photophobia and visual disturbance.  Respiratory:  Negative for cough, chest tightness, shortness of breath, wheezing and stridor.   Cardiovascular:  Negative for chest pain, palpitations and leg swelling.  Gastrointestinal:  Negative for abdominal distention, abdominal pain, anal bleeding, blood in stool, constipation, diarrhea, nausea and vomiting.  Genitourinary:  Negative for difficulty urinating, dysuria, flank pain and hematuria.  Musculoskeletal:  Negative for arthralgias, back pain, gait problem, joint swelling and myalgias.  Skin:  Negative for color change, pallor, rash and wound.  Neurological:  Negative for dizziness, tremors, weakness and light-headedness.  Hematological:  Negative for adenopathy. Does not bruise/bleed easily.  Psychiatric/Behavioral:  Negative for agitation, behavioral problems, confusion, decreased concentration, dysphoric mood and sleep disturbance.        Objective:   Physical Exam Constitutional:      Appearance: He is well-developed.   HENT:     Head: Normocephalic and atraumatic.  Eyes:     Conjunctiva/sclera: Conjunctivae normal.  Cardiovascular:     Rate and Rhythm: Normal rate and regular rhythm.  Pulmonary:     Effort: Pulmonary effort is normal. No respiratory distress.     Breath sounds: No wheezing.  Abdominal:     General: There is no distension.     Palpations: Abdomen is soft.  Musculoskeletal:     Cervical back: Normal range of motion and neck supple.  Skin:    General: Skin is warm and dry.     Coloration: Skin is not pale.     Findings: No erythema or rash.  Neurological:     General: No focal deficit present.     Mental Status: He is alert and oriented to person, place, and time.  Psychiatric:        Mood and Affect: Mood normal.        Behavior: Behavior normal.        Thought Content: Thought content normal.        Judgment: Judgment normal.    BKA sites 04/16/2023:            Assessment & Plan:   Assessment and Plan    Bilateral Lower Extremity Amputations Prostheses are fitting well. Previous wounds have healed completely. No signs of deep infection. -Continue current prosthetic use.  HIV Well controlled on Biktarvy. Last CD4 count was 381, indicating a healthy immune system. -Continue Biktarvy. -Check HIV status and basic blood work today.  Hypotension esp on HD days Patient requires medication to elevate blood pressure, especially on dialysis days. -Resubmitted prescription for midodrine to be taken on dialysis days (Monday, Wednesday, Friday).  Hyperlipidemia Patient is on pitavastatin 4mg  for cholesterol management. -Continue pitavastatin 4mg . -check lipid panel  Diabetes Managed with insulin. -Continue current insulin regimen.  General Health Maintenance -  Declined  COVID-19 vaccine -Return for follow-up in six months.

## 2023-04-17 ENCOUNTER — Other Ambulatory Visit (HOSPITAL_COMMUNITY): Payer: Self-pay | Admitting: Nephrology

## 2023-04-17 ENCOUNTER — Encounter (HOSPITAL_COMMUNITY): Payer: Self-pay

## 2023-04-17 ENCOUNTER — Ambulatory Visit (HOSPITAL_COMMUNITY)
Admission: RE | Admit: 2023-04-17 | Discharge: 2023-04-17 | Disposition: A | Discharge status: Home / Self Care | Payer: Self-pay | Source: Ambulatory Visit | Attending: Nephrology | Admitting: Nephrology

## 2023-04-17 ENCOUNTER — Other Ambulatory Visit: Payer: Self-pay

## 2023-04-17 DIAGNOSIS — N186 End stage renal disease: Secondary | ICD-10-CM

## 2023-04-17 DIAGNOSIS — Z89611 Acquired absence of right leg above knee: Secondary | ICD-10-CM | POA: Insufficient documentation

## 2023-04-17 DIAGNOSIS — E1122 Type 2 diabetes mellitus with diabetic chronic kidney disease: Secondary | ICD-10-CM | POA: Insufficient documentation

## 2023-04-17 DIAGNOSIS — Z89612 Acquired absence of left leg above knee: Secondary | ICD-10-CM | POA: Insufficient documentation

## 2023-04-17 DIAGNOSIS — T82858A Stenosis of vascular prosthetic devices, implants and grafts, initial encounter: Secondary | ICD-10-CM | POA: Insufficient documentation

## 2023-04-17 DIAGNOSIS — Z992 Dependence on renal dialysis: Secondary | ICD-10-CM | POA: Insufficient documentation

## 2023-04-17 DIAGNOSIS — Z794 Long term (current) use of insulin: Secondary | ICD-10-CM | POA: Insufficient documentation

## 2023-04-17 DIAGNOSIS — Y832 Surgical operation with anastomosis, bypass or graft as the cause of abnormal reaction of the patient, or of later complication, without mention of misadventure at the time of the procedure: Secondary | ICD-10-CM | POA: Insufficient documentation

## 2023-04-17 HISTORY — PX: IR AV DIALY SHUNT INTRO NEEDLE/INTRACATH INITIAL W/PTA/IMG LEFT: IMG6103

## 2023-04-17 HISTORY — PX: IR US GUIDE VASC ACCESS LEFT: IMG2389

## 2023-04-17 LAB — T-HELPER CELLS (CD4) COUNT (NOT AT ARMC)
CD4 % Helper T Cell: 25 % — ABNORMAL LOW (ref 33–65)
CD4 T Cell Abs: 315 /uL — ABNORMAL LOW (ref 400–1790)

## 2023-04-17 LAB — GLUCOSE, CAPILLARY: Glucose-Capillary: 186 mg/dL — ABNORMAL HIGH (ref 70–99)

## 2023-04-17 MED ORDER — IOHEXOL 300 MG/ML  SOLN
100.0000 mL | Freq: Once | INTRAMUSCULAR | Status: AC | PRN
  Administered 2023-04-17: 15 mL via INTRAVENOUS

## 2023-04-17 MED ORDER — MIDAZOLAM HCL 2 MG/2ML IJ SOLN
INTRAMUSCULAR | Status: AC
Start: 2023-04-17 — End: ?
  Filled 2023-04-17: qty 2

## 2023-04-17 MED ORDER — IOHEXOL 300 MG/ML  SOLN
100.0000 mL | Freq: Once | INTRAMUSCULAR | Status: AC | PRN
Start: 2023-04-17 — End: 2023-04-17
  Administered 2023-04-17: 35 mL via INTRAVENOUS

## 2023-04-17 MED ORDER — FENTANYL CITRATE (PF) 100 MCG/2ML IJ SOLN
INTRAMUSCULAR | Status: AC
  Filled 2023-04-17: qty 2

## 2023-04-17 MED ORDER — FENTANYL CITRATE (PF) 100 MCG/2ML IJ SOLN
INTRAMUSCULAR | Status: AC | PRN
Start: 2023-04-17 — End: 2023-04-17
  Administered 2023-04-17 (×2): 25 ug via INTRAVENOUS

## 2023-04-17 MED ORDER — LIDOCAINE HCL 1 % IJ SOLN
INTRAMUSCULAR | Status: AC
Start: 2023-04-17 — End: ?
  Filled 2023-04-17: qty 20

## 2023-04-17 MED ORDER — MIDAZOLAM HCL 2 MG/2ML IJ SOLN
INTRAMUSCULAR | Status: AC | PRN
  Administered 2023-04-17 (×2): .5 mg via INTRAVENOUS

## 2023-04-17 MED ORDER — SODIUM CHLORIDE 0.9% FLUSH: 10.0000 mL | Freq: Two times a day (BID) | INTRAVENOUS | Status: DC

## 2023-04-17 NOTE — H&P (Signed)
Chief Complaint: Patient was seen in consultation today for left upper arm dialysis fistula evaluation and possible intervention.  at the request of Maxie Barb  Referring Physician(s): Maxie Barb  Supervising Physician: Gilmer Mor  Patient Status: [facility] - Out-pt   Full Code status per patient.  History of Present Illness: Alvin Daniels is a 52 y.o. male   Here today for a left upper arm dialysis fistula and possible intervention for low access flow. The fistula was utilized yesterday. Patient was able to complete dialysis during the last use. He is unaware of any current issues. Patient is not sure of the age of the fistula. Interpretor present.   Past Medical History:  Diagnosis Date   Acute bilateral low back pain 01/03/2021   AIDS ([name]) 11/22/2014   Back pain 06/09/2019   BKA stump complication ([name]) 06/09/2019   Chronic diarrhea    Chronic hepatitis C without hepatic coma ([name]) 11/22/2014   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[name])    Right   Corneal ulcer 12/18/2022   Descemetocele of left eye 12/18/2022   Diabetic neuropathy ([name])    ESRD (end stage renal disease) on dialysis ([name])    "TTS; don't remember street name" (05/03/2014)   Hepatitis C    HIV INFECTION 06/27/2010   Qualifier: Diagnosis of  By: Jennye Moccasin CMA ( AAMA), Jacqueline     Hypotension 06/02/2012   Metabolic bone disease 06/18/2012   MRSA infection    Normocytic anemia 06/17/2012   Pancreatitis    Pressure ulcer of BKA stump, stage 2 ([name]) 11/22/2014   Renal disorder    Renal insufficiency    Severe protein-calorie malnutrition ([name]) 06/17/2012   Uncontrolled diabetes mellitus with complications 06/27/2010   Annotation: uncontrolled Qualifier: Diagnosis of  By: Jennye Moccasin CMA Duncan Dull), Jacqueline     Vaccine counseling 05/29/2022    Past Surgical History:  Procedure Laterality Date   AMPUTATION Left 04/20/2014   Procedure: 3rd toe amputation, 4th Toe  Amputation,  5th Toe Amputation;  Surgeon: Nadara Mustard, MD;  Location: [facility] OR;  Service: Orthopedics;  Laterality: Left;   AMPUTATION Left 05/02/2014   Procedure: Midfoot Amputation;  Surgeon: Nadara Mustard, MD;  Location: [facility] OR;  Service: Orthopedics;  Laterality: Left;   AMPUTATION Left 06/17/2014   Procedure: AMPUTATION BELOW KNEE;  Surgeon: Nadara Mustard, MD;  Location: [facility] OR;  Service: Orthopedics;  Laterality: Left;   AMPUTATION Right 09/04/2018   Procedure: RIGHT BELOW KNEE AMPUTATION;  Surgeon: Nadara Mustard, MD;  Location: [facility] OR;  Service: Orthopedics;  Laterality: Right;  RIGHT BELOW KNEE AMPUTATION   AV FISTULA PLACEMENT Left    AV FISTULA PLACEMENT Left 05/10/2016   Procedure: Creation Left Arm Brachiocephalic Arteriovenous Fistula and Ligation of Radiocephalic Fistula;  Surgeon: Chuck Hint, MD;  Location: [facility] OR;  Service: Vascular;  Laterality: Left;   FEMUR IM NAIL Left 08/17/2016   Procedure: INTRAMEDULLARY (IM) RETROGRADE FEMORAL NAILING;  Surgeon: Eldred Manges, MD;  Location: [facility] OR;  Service: Orthopedics;  Laterality: Left;   FOOT AMPUTATION THROUGH ANKLE Left 12/'21/2015   midfoot   IR GENERIC HISTORICAL Left 05/01/2016   IR THROMBECTOMY AV FISTULA W/THROMBOLYSIS/PTA INC/SHUNT/IMG LEFT 05/01/2016 Oley Balm, MD [facility]INTER[facility]   IR GENERIC HISTORICAL  05/01/2016   IR US GUIDE VASC ACCESS LEFT 05/01/2016 Oley Balm, MD [facility]   IR GENERIC HISTORICAL  05/07/2016   IR FLUORO GUIDE CV LINE RIGHT 05/07/2016 Gilmer Mor, DO [facility]   IR GENERIC  HISTORICAL  05/07/2016   IR US GUIDE VASC ACCESS RIGHT 05/07/2016 Gilmer Mor, DO [facility]   IR GENERIC HISTORICAL  05/22/2016   IR US GUIDE VASC ACCESS RIGHT 05/22/2016 Berdine Dance, MD [facility]   IR GENERIC HISTORICAL  05/22/2016   IR FLUORO GUIDE CV LINE RIGHT 05/22/2016 Berdine Dance, MD [facility]   IR REMOVAL TUN CV CATH W/O [facility]  08/21/2016   PERIPHERAL VASCULAR CATHETERIZATION Left 05/09/2016    Procedure: A/V Fistulagram;  Surgeon: Chuck Hint, MD;  Location: [facility] INVASIVE CV LAB;  Service: Cardiovascular;  Laterality: Left;  arm   TEE WITHOUT CARDIOVERSION N/A 06/22/2018   Procedure: TRANSESOPHAGEAL ECHOCARDIOGRAM (TEE);  Surgeon: Jake Bathe, MD;  Location: [facility] ENDOSCOPY;  Service: Cardiovascular;  Laterality: N/A;    Allergies: Patient has no known allergies.  Medications: Prior to Admission medications   Medication Sig Start Date End Date Taking? Authorizing Provider  bictegravir-emtricitabine-tenofovir AF (BIKTARVY) 50-200-25 MG TABS tablet Take 1 tablet by mouth daily. 04/16/23  Yes Daiva Eves, Lisette Grinder, MD  insulin aspart protamine- aspart (NOVOLOG MIX 70/30) (70-30) 100 UNIT/ML injection Inject 0.2 mLs (20 Units total) into the skin 2 (two) times daily with a meal. 01/29/23  Yes Georganna Skeans, MD  loperamide (IMODIUM A-D) 2 MG tablet Take 1 tablet by mouth as directed for diarrhea 01/27/23  Yes   Blood Glucose Monitoring Suppl (TRUE METRIX METER) w/Device KIT Use kit to check glucose 3 (three) times daily. 12/27/21   Hoy Register, MD  doxycycline (VIBRAMYCIN) 100 MG capsule Take 1 capsule (100 mg total) by mouth 2 (two) times daily. 08/22/22   Carroll Sage, PA-C  erythromycin ophthalmic ointment Place into the left eye 2 (two) times daily. 08/03/22   Lanae Boast, MD  glucose blood (TRUE METRIX BLOOD GLUCOSE TEST) test strip use 1 test strip to check glucose 3 (three) times daily. 09/25/22   Georganna Skeans, MD  insulin aspart protamine - aspart (NOVOLOG 70/30 FLEXPEN) (70-30) 100 UNIT/ML FlexPen Inject 20 Units into the skin 2 (two) times daily. 11/01/22   Georganna Skeans, MD  Insulin Lispro Prot & Lispro (HUMALOG MIX 75/25 KWIKPEN) (75-25) 100 UNIT/ML Kwikpen Inject 20 Units into the skin 2 (two) times daily. 01/17/23   Georganna Skeans, MD  insulin NPH-regular Human (HUMULIN 70/30) (70-30) 100 UNIT/ML injection Inject 20 units subcutaneously twice daily with meals  09/25/22   Georganna Skeans, MD  insulin NPH-regular Human (HUMULIN 70/30) (70-30) 100 UNIT/ML injection Inject 15 unit subcutaneously twice a day as directed before meals 09/25/22   Georganna Skeans, MD  Insulin Pen Needle 31G X 5 MM MISC Utilize as needed to inject insulin sq as recommended (BID) 11/01/22   Georganna Skeans, MD  Insulin Syringe-Needle U-100 (BD INSULIN SYRINGE ULTRAFINE) 31G X 15/64" 0.5 ML MISC 1 each by Does not apply route 2 (two) times daily. 09/25/22   Georganna Skeans, MD  Insulin Syringes, Disposable, U-100 0.5 ML MISC Utilize  as needed to inject insulin 01/29/23   Georganna Skeans, MD  iron sucrose in sodium chloride 0.9 % 100 mL Iron Sucrose (Venofer) 01/08/23 01/07/24  [provider]  midodrine (PROAMATINE) 10 MG tablet Take 1 tablet by mouth three times a week as directed. Take 1 tablet 30 minutes prior to HD and 1 tablet during treatment 02/17/23     midodrine (PROAMATINE) 10 MG tablet Take 1 tablet by mouth three times a week as directed Take 1 tablet 30 minutes prior to HD and 1 tab during  treatment 04/02/23     midodrine (PROAMATINE) 10 MG tablet Take 1 tablet (10 mg total) by mouth every Monday, Wednesday, and Friday before hemodialysis. 04/02/23   Lenny Pastel, PA-C  midodrine (PROAMATINE) 10 MG tablet Take 1 tablet (10 mg total) by mouth 3 (three) times a week. Take 1 tablet 30 minutes prior to HD and 1 tablet during treatment 04/09/23     midodrine (PROAMATINE) 10 MG tablet Take 1 tablet by mouth three times a week as directed. Take 1 tablet 30 minutes prior to HD and 1 tab during treatment. 04/16/23   Randall Hiss, MD  mupirocin ointment (BACTROBAN) 2 % Place 1 Application into the nose 2 (two) times daily. 08/03/22   Lanae Boast, MD  Pitavastatin Magnesium 4 MG TABS Take 1 tablet (4 mg total) by mouth daily. 04/16/23   Randall Hiss, MD  prednisoLONE acetate (PRED FORTE) 1 % ophthalmic suspension Place 1 drop into the left eye 3 (three) times daily. 08/03/22    Lanae Boast, MD  TRUEplus Lancets 28G MISC Use to test 3 (three) times daily. 12/27/21   Hoy Register, MD  bictegravir-emtricitabine-tenofovir AF (BIKTARVY) 50-200-25 MG TABS tablet Take 1 tablet by mouth daily. 01/02/22   Randall Hiss, MD     Family History  Problem Relation Age of Onset   Diabetes Mother    Diabetes Father     Social History   Socioeconomic History   Marital status: Single    Spouse name: Not on file   Number of children: Not on file   Years of education: Not on file   Highest education level: Not on file  Occupational History   Not on file  Tobacco Use   Smoking status: Never   Smokeless tobacco: Never  Vaping Use   Vaping status: Never Used  Substance and Sexual Activity   Alcohol use: No   Drug use: No   Sexual activity: Not Currently    Comment: declined condoms  Other Topics Concern   Not on file  Social History Narrative   ** Merged History Encounter **       Social Determinants of Health   Financial Resource Strain: Low Risk  (01/29/2023)   Overall Financial Resource Strain (CARDIA)    Difficulty of Paying Living Expenses: Not very hard  Food Insecurity: No Food Insecurity (01/29/2023)   Hunger Vital Sign    Worried About Running Out of Food in the Last Year: Never true    Ran Out of Food in the Last Year: Never true  Transportation Needs: Unmet Transportation Needs (01/29/2023)   PRAPARE - Transportation    Lack of Transportation (Medical): Yes    Lack of Transportation (Non-Medical): Yes  Physical Activity: Inactive (01/29/2023)   Exercise Vital Sign    Days of Exercise per Week: 0 days    Minutes of Exercise per Session: 0 min  Stress: No Stress Concern Present (01/29/2023)   [employer] of Occupational Health - Occupational Stress Questionnaire    Feeling of Stress : Not at all  Social Connections: Moderately Isolated (01/29/2023)   Social Connection and Isolation Panel [NHANES]    Frequency of Communication with  Friends and Family: More than three times a week    Frequency of Social Gatherings with Friends and Family: More than three times a week    Attends Religious Services: More than 4 times per year    Active Member of Clubs or Organizations: No    Attends  Club or Organization Meetings: Never    Marital Status: Never married    Review of Systems: A 12 point ROS discussed and pertinent positives are indicated in the HPI above.  All other systems are negative.  Review of Systems  Constitutional:  Negative for activity change and fever.  Respiratory:  Negative for cough and shortness of breath.   Cardiovascular:  Negative for chest pain.  Gastrointestinal:  Negative for abdominal pain.  Psychiatric/Behavioral:  Negative for behavioral problems, confusion and decreased concentration.     Vital Signs: BP 106/66   Pulse 79   Temp 97.7 F (36.5 C)   Ht 3' 5.73" (1.06 m)   SpO2 99%   BMI 43.60 kg/m     Physical Exam HENT:     Head: Normocephalic.     Mouth/Throat:     Mouth: Mucous membranes are moist.  Cardiovascular:     Rate and Rhythm: Normal rate and regular rhythm.     Heart sounds: No murmur heard. Pulmonary:     Effort: Pulmonary effort is normal.     Breath sounds: No wheezing.  Musculoskeletal:     Comments: Bilateral above the knee amputations.   Skin:    General: Skin is warm and dry.  Neurological:     Mental Status: He is alert and oriented to person, place, and time.  Psychiatric:        Mood and Affect: Mood normal.        Behavior: Behavior normal.     Imaging: No results found.  Labs:  CBC: Recent Labs    08/21/22 1929 12/11/22 2116 12/11/22 2127 12/12/22 1625 04/16/23 1357  WBC 7.9 6.4  --  8.1 6.1  HGB 11.6* 11.3* 12.2* 11.5* 12.0*  HCT 36.4* 33.4* 36.0* 34.5* 36.2*  PLT 294 208  --  196 202    COAGS: No results for input(s): "INR", "APTT" in the last 8760 hours.  BMP: Recent Labs    08/11/22 2047 08/21/22 1929 12/11/22 2116  12/11/22 2127 12/12/22 1625  NA 123* 139 131* 132* 134*  K 5.0 4.6 3.9 4.0 4.6  CL 84* 97* 90* 89* 90*  CO2 23 31 27   --  29  GLUCOSE 484* 243* 443* 451* 227*  BUN 41* 13 18 17  28*  CALCIUM 8.6* 8.7* 8.5*  --  9.3  CREATININE 9.73* 5.04* 5.92* 6.60* 7.64*  GFRNONAA 6* 13* 11*  --  8*    LIVER FUNCTION TESTS: Recent Labs    08/01/22 2353 08/11/22 2047 12/11/22 2116  BILITOT 0.9 1.0 0.5  AST 22 18 34  ALT 10 13 23   ALKPHOS 88 117 130*  PROT 8.1 7.7 8.6*  ALBUMIN 3.6 3.6 3.5    TUMOR MARKERS: No results for input(s): "AFPTM", "CEA", "CA199", "CHROMGRNA" in the last 8760 hours.  Assessment and Plan:  Patient is scheduled for a left arm dialysis fistula evaluation and possible intervention. Possible tunneled dialysis catheter placement if needed.   Risks and benefits discussed with the patient including, but not limited to bleeding, infection, vascular injury, pulmonary embolism, need for tunneled HD catheter placement or even death.  All of the patient's questions were answered, patient is agreeable to proceed. Consent signed and in chart.  Thank you for this interesting consult.  I greatly enjoyed meeting Kaliq Victorino Daniels and look forward to participating in their care.  A copy of this report was sent to the requesting provider on this date.  Electronically Signed: Robet Leu, PA-C 04/17/2023,  12:26 PM   I spent a total of 30 Minutes   in face to face in clinical consultation, greater than 50% of which was counseling/coordinating care for left arm dialysis fistula evaluation and possible intervention.

## 2023-04-17 NOTE — Procedures (Signed)
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 Loreta Ave, DO

## 2023-04-18 LAB — LIPID PANEL
Cholesterol: 166 mg/dL (ref <200)
HDL: 40 mg/dL (ref >=40)
LDL Cholesterol (Calc): 90 mg/dL
Non-HDL Cholesterol (Calc): 126 mg/dL (ref <130)
Total CHOL/HDL Ratio: 4.2 (calc) (ref <5.0)
Triglycerides: 277 mg/dL — ABNORMAL HIGH (ref <150)

## 2023-04-18 LAB — CBC WITH DIFFERENTIAL/PLATELET
Absolute Lymphocytes: 1379 {cells}/uL (ref 850–3900)
Absolute Monocytes: 555 {cells}/uL (ref 200–950)
Basophils Absolute: 92 {cells}/uL (ref 0–200)
Basophils Relative: 1.5 %
Eosinophils Absolute: 464 {cells}/uL (ref 15–500)
Eosinophils Relative: 7.6 %
HCT: 36.2 % — ABNORMAL LOW (ref 38.5–50.0)
Hemoglobin: 12 g/dL — ABNORMAL LOW (ref 13.2–17.1)
MCH: 34 pg — ABNORMAL HIGH (ref 27.0–33.0)
MCHC: 33.1 g/dL (ref 32.0–36.0)
MCV: 102.5 fL — ABNORMAL HIGH (ref 80.0–100.0)
MPV: 10.6 fL (ref 7.5–12.5)
Monocytes Relative: 9.1 %
Neutro Abs: 3611 {cells}/uL (ref 1500–7800)
Neutrophils Relative %: 59.2 %
Platelets: 202 10*3/uL (ref 140–400)
RBC: 3.53 10*6/uL — ABNORMAL LOW (ref 4.20–5.80)
RDW: 13.1 % (ref 11.0–15.0)
Total Lymphocyte: 22.6 %
WBC: 6.1 10*3/uL (ref 3.8–10.8)

## 2023-04-18 LAB — RPR: RPR Ser Ql: NONREACTIVE

## 2023-04-18 LAB — HIV-1 RNA QUANT-NO REFLEX-BLD
HIV 1 RNA Quant: 27 {copies}/mL — ABNORMAL HIGH
HIV-1 RNA Quant, Log: 1.42 {Log_copies}/mL — ABNORMAL HIGH

## 2023-04-23 ENCOUNTER — Ambulatory Visit: Payer: Self-pay | Admitting: Infectious Disease

## 2023-05-23 ENCOUNTER — Other Ambulatory Visit: Payer: Self-pay

## 2023-05-26 ENCOUNTER — Other Ambulatory Visit: Payer: Self-pay

## 2023-06-04 ENCOUNTER — Ambulatory Visit: Payer: Self-pay | Admitting: Family Medicine

## 2023-06-04 ENCOUNTER — Telehealth: Payer: Self-pay | Admitting: Family Medicine

## 2023-06-04 NOTE — Telephone Encounter (Signed)
Called pt and left vm to call office back to reschedule missed appt

## 2023-06-17 ENCOUNTER — Other Ambulatory Visit: Payer: Self-pay

## 2023-06-18 ENCOUNTER — Other Ambulatory Visit: Payer: Self-pay

## 2023-07-17 ENCOUNTER — Other Ambulatory Visit: Payer: Self-pay | Admitting: Family Medicine

## 2023-07-17 ENCOUNTER — Ambulatory Visit: Payer: Self-pay | Admitting: Family Medicine

## 2023-07-17 ENCOUNTER — Encounter: Payer: Self-pay | Admitting: Family Medicine

## 2023-07-17 VITALS — BP 100/66 | HR 82 | Temp 98.3°F | Resp 16 | Ht 59.5 in | Wt 112.4 lb

## 2023-07-17 DIAGNOSIS — E1122 Type 2 diabetes mellitus with diabetic chronic kidney disease: Secondary | ICD-10-CM

## 2023-07-17 DIAGNOSIS — Z21 Asymptomatic human immunodeficiency virus [HIV] infection status: Secondary | ICD-10-CM

## 2023-07-17 DIAGNOSIS — N186 End stage renal disease: Secondary | ICD-10-CM

## 2023-07-17 DIAGNOSIS — Z789 Other specified health status: Secondary | ICD-10-CM

## 2023-07-17 DIAGNOSIS — Z794 Long term (current) use of insulin: Secondary | ICD-10-CM

## 2023-07-17 DIAGNOSIS — I10 Essential (primary) hypertension: Secondary | ICD-10-CM

## 2023-07-17 LAB — POCT GLYCOSYLATED HEMOGLOBIN (HGB A1C): Hemoglobin A1C: 10 % — AB (ref 4.0–5.6)

## 2023-07-17 MED ORDER — INSULIN PEN NEEDLE 31G X 5 MM MISC: 11 refills | Status: DC

## 2023-07-17 MED ORDER — TRUEPLUS LANCETS 28G MISC: 1.0000 | Freq: Three times a day (TID) | 2 refills | Status: DC

## 2023-07-17 MED ORDER — TRUE METRIX BLOOD GLUCOSE TEST VI STRP: 1.0000 | ORAL_STRIP | Freq: Three times a day (TID) | 2 refills | Status: DC

## 2023-07-17 MED ORDER — INSULIN LISPRO PROT & LISPRO (75-25 MIX) 100 UNIT/ML KWIKPEN: 20.0000 [IU] | PEN_INJECTOR | Freq: Two times a day (BID) | SUBCUTANEOUS | 11 refills | Status: DC

## 2023-07-17 MED ORDER — NOVOLOG 70/30 FLEXPEN RELION (70-30) 100 UNIT/ML ~~LOC~~ SUPN: 20.0000 [IU] | PEN_INJECTOR | Freq: Two times a day (BID) | SUBCUTANEOUS | 11 refills | Status: DC

## 2023-07-17 NOTE — Telephone Encounter (Unsigned)
 Copied from CRM [id number]. Topic: Clinical - Medication Refill >> Jul 17, 2023 10:05 AM Martha Clan wrote: Most Recent Primary Care Visit:  Provider: Georganna Skeans  Department: [facility]  Visit Type: OFFICE VISIT  Date: 01/29/2023  Medication: Insulin Pen Needle 31G X 5 MM MISC [[id number]]  Has the patient contacted their pharmacy? Yes (Agent: If no, request that the patient contact the pharmacy for the refill. If patient does not wish to contact the pharmacy document the reason why and proceed with request.) (Agent: If yes, when and what did the pharmacy advise?)  Is this the correct pharmacy for this prescription? Yes If no, delete pharmacy and type the correct one.  This is the patient's preferred pharmacy:  [facility] [phone] @ [address] [place], [place] - [address] [place] [address] Phone: [phone] Fax: [phone]   Has the prescription been filled recently? No  Is the patient out of the medication? Yes  Has the patient been seen for an appointment in the last year OR does the patient have an upcoming appointment? Yes  Can we respond through MyChart? No  Agent: Please be advised that Rx refills may take up to 3 business days. We ask that you follow-up with your pharmacy.

## 2023-07-17 NOTE — Telephone Encounter (Unsigned)
 Copied from CRM [id number]. Topic: Clinical - Medication Refill >> Jul 17, 2023 10:03 AM Martha Clan wrote: Most Recent Primary Care Visit:  Provider: Georganna Skeans  Department: [facility]  Visit Type: OFFICE VISIT  Date: 01/29/2023  Medication: Insulin Pen Needle 31G X 5 MM MISC [[id number]]  Has the patient contacted their pharmacy? Yes (Agent: If no, request that the patient contact the pharmacy for the refill. If patient does not wish to contact the pharmacy document the reason why and proceed with request.) (Agent: If yes, when and what did the pharmacy advise?)  Is this the correct pharmacy for this prescription? Yes If no, delete pharmacy and type the correct one.  This is the patient's preferred pharmacy:  [facility] [phone] @ [address] [place], [place] - [address] [place] [address] Phone: [phone] Fax: [phone]   Has the prescription been filled recently? No  Is the patient out of the medication? Yes  Has the patient been seen for an appointment in the last year OR does the patient have an upcoming appointment? Yes  Can we respond through MyChart? No  Agent: Please be advised that Rx refills may take up to 3 business days. We ask that you follow-up with your pharmacy.

## 2023-07-21 ENCOUNTER — Other Ambulatory Visit: Payer: Self-pay

## 2023-07-21 ENCOUNTER — Encounter: Payer: Self-pay | Admitting: Family Medicine

## 2023-07-21 NOTE — Progress Notes (Signed)
 Established Patient Office Visit  Subjective    Patient ID: Alvin Daniels, male    DOB: 08/12/1970  Age: 53 y.o. MRN: [id number]  CC:  Chief Complaint  Patient presents with   Medication Refill    HPI Alvin Daniels presents for follow up of chronic med issues including diabetes and hypertension. Patient denies acute complaints this visit was aided by an interpreter.   Outpatient Encounter Medications as of 07/17/2023  Medication Sig   bictegravir-emtricitabine-tenofovir AF (BIKTARVY) 50-200-25 MG TABS tablet Take 1 tablet by mouth daily.   Blood Glucose Monitoring Suppl (TRUE METRIX METER) w/Device KIT Use kit to check glucose 3 (three) times daily.   doxycycline (VIBRAMYCIN) 100 MG capsule Take 1 capsule (100 mg total) by mouth 2 (two) times daily.   erythromycin ophthalmic ointment Place into the left eye 2 (two) times daily.   insulin aspart protamine- aspart (NOVOLOG MIX 70/30) (70-30) 100 UNIT/ML injection Inject 0.2 mLs (20 Units total) into the skin 2 (two) times daily with a meal.   insulin NPH-regular Human (HUMULIN 70/30) (70-30) 100 UNIT/ML injection Inject 20 units subcutaneously twice daily with meals   insulin NPH-regular Human (HUMULIN 70/30) (70-30) 100 UNIT/ML injection Inject 15 unit subcutaneously twice a day as directed before meals   Insulin Syringe-Needle U-100 (BD INSULIN SYRINGE ULTRAFINE) 31G X 15/64" 0.5 ML MISC 1 each by Does not apply route 2 (two) times daily.   Insulin Syringes, Disposable, U-100 0.5 ML MISC Utilize  as needed to inject insulin   iron sucrose in sodium chloride 0.9 % 100 mL Iron Sucrose (Venofer)   loperamide (IMODIUM A-D) 2 MG tablet Take 1 tablet by mouth as directed for diarrhea   midodrine (PROAMATINE) 10 MG tablet Take 1 tablet by mouth three times a week as directed. Take 1 tablet 30 minutes prior to HD and 1 tablet during treatment   midodrine (PROAMATINE) 10 MG tablet Take 1 tablet by mouth three  times a week as directed Take 1 tablet 30 minutes prior to HD and 1 tab during treatment   midodrine (PROAMATINE) 10 MG tablet Take 1 tablet (10 mg total) by mouth every Monday, Wednesday, and Friday before hemodialysis.   midodrine (PROAMATINE) 10 MG tablet Take 1 tablet (10 mg total) by mouth 3 (three) times a week. Take 1 tablet 30 minutes prior to HD and 1 tablet during treatment   midodrine (PROAMATINE) 10 MG tablet Take 1 tablet by mouth three times a week as directed. Take 1 tablet 30 minutes prior to HD and 1 tab during treatment.   mupirocin ointment (BACTROBAN) 2 % Place 1 Application into the nose 2 (two) times daily.   Pitavastatin Magnesium 4 MG TABS Take 1 tablet (4 mg total) by mouth daily.   prednisoLONE acetate (PRED FORTE) 1 % ophthalmic suspension Place 1 drop into the left eye 3 (three) times daily.   [DISCONTINUED] glucose blood (TRUE METRIX BLOOD GLUCOSE TEST) test strip use 1 test strip to check glucose 3 (three) times daily.   [DISCONTINUED] insulin aspart protamine - aspart (NOVOLOG 70/30 FLEXPEN) (70-30) 100 UNIT/ML FlexPen Inject 20 Units into the skin 2 (two) times daily.   [DISCONTINUED] Insulin Lispro Prot & Lispro (HUMALOG MIX 75/25 KWIKPEN) (75-25) 100 UNIT/ML Kwikpen Inject 20 Units into the skin 2 (two) times daily.   [DISCONTINUED] Insulin Pen Needle 31G X 5 MM MISC Utilize as needed to inject insulin sq as recommended (BID)   [DISCONTINUED] TRUEplus Lancets 28G MISC Use to test  3 (three) times daily.   glucose blood (TRUE METRIX BLOOD GLUCOSE TEST) test strip use 1 test strip to check glucose 3 (three) times daily.   insulin aspart protamine - aspart (NOVOLOG 70/30 FLEXPEN) (70-30) 100 UNIT/ML FlexPen Inject 20 Units into the skin 2 (two) times daily.   Insulin Lispro Prot & Lispro (HUMALOG MIX 75/25 KWIKPEN) (75-25) 100 UNIT/ML Kwikpen Inject 20 Units into the skin 2 (two) times daily.   Insulin Pen Needle 31G X 5 MM MISC Utilize as needed to inject insulin sq as  recommended (BID)   TRUEplus Lancets 28G MISC Use to test 3 (three) times daily.   [DISCONTINUED] bictegravir-emtricitabine-tenofovir AF (BIKTARVY) 50-200-25 MG TABS tablet Take 1 tablet by mouth daily.   No facility-administered encounter medications on file as of 07/17/2023.    Past Medical History:  Diagnosis Date   Acute bilateral low back pain 01/03/2021   AIDS ([name]) 11/22/2014   Back pain 06/09/2019   BKA stump complication ([name]) 06/09/2019   Chronic diarrhea    Chronic hepatitis C without hepatic coma ([name]) 11/22/2014   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[name])    Right   Corneal ulcer 12/18/2022   Descemetocele of left eye 12/18/2022   Diabetic neuropathy ([name])    ESRD (end stage renal disease) on dialysis ([name])    "TTS; don't remember street name" (05/03/2014)   Hepatitis C    HIV INFECTION 06/27/2010   Qualifier: Diagnosis of  By: Jennye Moccasin CMA ( AAMA), Jacqueline     Hypotension 06/02/2012   Metabolic bone disease 06/18/2012   MRSA infection    Normocytic anemia 06/17/2012   Pancreatitis    Pressure ulcer of BKA stump, stage 2 ([name]) 11/22/2014   Renal disorder    Renal insufficiency    Severe protein-calorie malnutrition ([name]) 06/17/2012   Uncontrolled diabetes mellitus with complications 06/27/2010   Annotation: uncontrolled Qualifier: Diagnosis of  By: Jennye Moccasin CMA Duncan Dull), Jacqueline     Vaccine counseling 05/29/2022    Past Surgical History:  Procedure Laterality Date   AMPUTATION Left 04/20/2014   Procedure: 3rd toe amputation, 4th Toe Amputation,  5th Toe Amputation;  Surgeon: Nadara Mustard, MD;  Location: [facility] OR;  Service: Orthopedics;  Laterality: Left;   AMPUTATION Left 05/02/2014   Procedure: Midfoot Amputation;  Surgeon: Nadara Mustard, MD;  Location: [facility] OR;  Service: Orthopedics;  Laterality: Left;   AMPUTATION Left 06/17/2014   Procedure: AMPUTATION BELOW KNEE;  Surgeon: Nadara Mustard, MD;  Location: [facility] OR;  Service: Orthopedics;  Laterality: Left;    AMPUTATION Right 09/04/2018   Procedure: RIGHT BELOW KNEE AMPUTATION;  Surgeon: Nadara Mustard, MD;  Location: [facility] OR;  Service: Orthopedics;  Laterality: Right;  RIGHT BELOW KNEE AMPUTATION   AV FISTULA PLACEMENT Left    AV FISTULA PLACEMENT Left 05/10/2016   Procedure: Creation Left Arm Brachiocephalic Arteriovenous Fistula and Ligation of Radiocephalic Fistula;  Surgeon: Chuck Hint, MD;  Location: [facility] OR;  Service: Vascular;  Laterality: Left;   FEMUR IM NAIL Left 08/17/2016   Procedure: INTRAMEDULLARY (IM) RETROGRADE FEMORAL NAILING;  Surgeon: Eldred Manges, MD;  Location: [facility] OR;  Service: Orthopedics;  Laterality: Left;   FOOT AMPUTATION THROUGH ANKLE Left 12/'21/2015   midfoot   IR AV DIALY SHUNT INTRO NEEDLE/INTRACATH INITIAL W/PTA/IMG LEFT  04/17/2023   IR GENERIC HISTORICAL Left 05/01/2016   IR THROMBECTOMY AV FISTULA W/THROMBOLYSIS/PTA INC/SHUNT/IMG LEFT 05/01/2016 Oley Balm, MD [facility]   IR GENERIC HISTORICAL  05/01/2016   IR  US GUIDE VASC ACCESS LEFT 05/01/2016 Oley Balm, MD [facility]   IR GENERIC HISTORICAL  05/07/2016   IR FLUORO GUIDE CV LINE RIGHT 05/07/2016 Gilmer Mor, DO [facility]   IR GENERIC HISTORICAL  05/07/2016   IR US GUIDE VASC ACCESS RIGHT 05/07/2016 Gilmer Mor, DO [facility]   IR GENERIC HISTORICAL  05/22/2016   IR US GUIDE VASC ACCESS RIGHT 05/22/2016 Berdine Dance, MD [facility]   IR GENERIC HISTORICAL  05/22/2016   IR FLUORO GUIDE CV LINE RIGHT 05/22/2016 Berdine Dance, MD [facility]   IR REMOVAL TUN CV CATH W/O FL  08/21/2016   IR US GUIDE VASC ACCESS LEFT  04/17/2023   PERIPHERAL VASCULAR CATHETERIZATION Left 05/09/2016   Procedure: A/V Fistulagram;  Surgeon: Chuck Hint, MD;  Location: [facility] INVASIVE CV LAB;  Service: Cardiovascular;  Laterality: Left;  arm   TEE WITHOUT CARDIOVERSION N/A 06/22/2018   Procedure: TRANSESOPHAGEAL ECHOCARDIOGRAM (TEE);  Surgeon: Jake Bathe, MD;  Location: [facility]COPY;  Service:  Cardiovascular;  Laterality: N/A;    Family History  Problem Relation Age of Onset   Diabetes Mother    Diabetes Father     Social History   Socioeconomic History   Marital status: Single    Spouse name: Not on file   Number of children: Not on file   Years of education: Not on file   Highest education level: Not on file  Occupational History   Not on file  Tobacco Use   Smoking status: Never   Smokeless tobacco: Never  Vaping Use   Vaping status: Never Used  Substance and Sexual Activity   Alcohol use: No   Drug use: No   Sexual activity: Not Currently    Comment: declined condoms  Other Topics Concern   Not on file  Social History Narrative   ** Merged History Encounter **       Social Drivers of Health   Financial Resource Strain: Low Risk  (01/29/2023)   Overall Financial Resource Strain (CARDIA)    Difficulty of Paying Living Expenses: Not very hard  Food Insecurity: No Food Insecurity (01/29/2023)   Hunger Vital Sign    Worried About Running Out of Food in the Last Year: Never true    Ran Out of Food in the Last Year: Never true  Transportation Needs: Unmet Transportation Needs (01/29/2023)   PRAPARE - Transportation    Lack of Transportation (Medical): Yes    Lack of Transportation (Non-Medical): Yes  Physical Activity: Inactive (01/29/2023)   Exercise Vital Sign    Days of Exercise per Week: 0 days    Minutes of Exercise per Session: 0 min  Stress: No Stress Concern Present (01/29/2023)   [employer] of Occupational Health - Occupational Stress Questionnaire    Feeling of Stress : Not at all  Social Connections: Moderately Isolated (01/29/2023)   Social Connection and Isolation Panel [NHANES]    Frequency of Communication with Friends and Family: More than three times a week    Frequency of Social Gatherings with Friends and Family: More than three times a week    Attends Religious Services: More than 4 times per year    Active Member of [employer] or  Organizations: No    Attends Banker Meetings: Never    Marital Status: Never married  Intimate Partner Violence: Not At Risk (01/29/2023)   Humiliation, Afraid, Rape, and Kick questionnaire    Fear of Current or Ex-Partner: No    Emotionally  Abused: No    Physically Abused: No    Sexually Abused: No    Review of Systems  All other systems reviewed and are negative.       Objective    BP 100/66   Pulse 82   Temp 98.3 F (36.8 C) (Oral)   Resp 16   Ht 4' 11.5" (1.511 m)   Wt 112 lb 6.4 oz (51 kg)   SpO2 96%   BMI 22.32 kg/m   Physical Exam Vitals and nursing note reviewed.  Constitutional:      General: He is not in acute distress. Cardiovascular:     Rate and Rhythm: Normal rate and regular rhythm.  Pulmonary:     Effort: Pulmonary effort is normal.     Breath sounds: Normal breath sounds.  Abdominal:     Palpations: Abdomen is soft.     Tenderness: There is no abdominal tenderness.  Musculoskeletal:     Comments: Utilizing walker     Right Lower Extremity: Right leg is amputated below knee.     Left Lower Extremity: Left leg is amputated below knee.  Neurological:     General: No focal deficit present.     Mental Status: He is alert and oriented to person, place, and time.         Assessment & Plan:   1. 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  - TRUEplus Lancets 28G MISC; Use to test 3 (three) times daily.  Dispense: 100 each; Refill: 2 - glucose blood (TRUE METRIX BLOOD GLUCOSE TEST) test strip; use 1 test strip to check glucose 3 (three) times daily.  Dispense: 100 each; Refill: 2 - HgB A1c  2. Encounter for long-term (current) use of insulin ([name])   3. Essential hypertension Appears stable.   4. HIV infection, unspecified symptom status ([name]) Management as per consultant  5. Language barrier to communication      Return in about 6 months (around 01/17/2024) for  follow up, chronic med issues.   Tommie Raymond, MD

## 2023-08-06 ENCOUNTER — Other Ambulatory Visit: Payer: Self-pay

## 2023-08-06 MED ORDER — MIDODRINE HCL 10 MG PO TABS
10.0000 mg | ORAL_TABLET | Freq: Three times a day (TID) | ORAL | 3 refills | Status: DC
  Filled 2023-08-06: qty 270, 90d supply, fill #0
  Filled 2023-08-18: qty 90, 30d supply, fill #0

## 2023-08-13 ENCOUNTER — Ambulatory Visit: Payer: Self-pay | Admitting: Family Medicine

## 2023-08-18 ENCOUNTER — Other Ambulatory Visit: Payer: Self-pay

## 2023-08-19 ENCOUNTER — Other Ambulatory Visit: Payer: Self-pay

## 2023-08-29 ENCOUNTER — Encounter (HOSPITAL_COMMUNITY): Payer: Self-pay | Admitting: *Deleted

## 2023-08-29 ENCOUNTER — Observation Stay (HOSPITAL_COMMUNITY)
Admission: EM | Admit: 2023-08-29 | Discharge: 2023-08-30 | Disposition: A | Discharge status: Home / Self Care | Payer: Self-pay | Attending: Student | Admitting: Student

## 2023-08-29 ENCOUNTER — Other Ambulatory Visit: Payer: Self-pay

## 2023-08-29 DIAGNOSIS — N186 End stage renal disease: Secondary | ICD-10-CM

## 2023-08-29 DIAGNOSIS — Z79899 Other long term (current) drug therapy: Secondary | ICD-10-CM | POA: Insufficient documentation

## 2023-08-29 DIAGNOSIS — T82898A Other specified complication of vascular prosthetic devices, implants and grafts, initial encounter: Secondary | ICD-10-CM | POA: Diagnosis present

## 2023-08-29 DIAGNOSIS — Z21 Asymptomatic human immunodeficiency virus [HIV] infection status: Secondary | ICD-10-CM | POA: Diagnosis present

## 2023-08-29 DIAGNOSIS — E1165 Type 2 diabetes mellitus with hyperglycemia: Secondary | ICD-10-CM | POA: Insufficient documentation

## 2023-08-29 DIAGNOSIS — Z794 Long term (current) use of insulin: Secondary | ICD-10-CM | POA: Insufficient documentation

## 2023-08-29 DIAGNOSIS — T82868A Thrombosis of vascular prosthetic devices, implants and grafts, initial encounter: Principal | ICD-10-CM | POA: Insufficient documentation

## 2023-08-29 DIAGNOSIS — Z992 Dependence on renal dialysis: Secondary | ICD-10-CM | POA: Insufficient documentation

## 2023-08-29 DIAGNOSIS — Z89511 Acquired absence of right leg below knee: Secondary | ICD-10-CM | POA: Insufficient documentation

## 2023-08-29 DIAGNOSIS — B20 Human immunodeficiency virus [HIV] disease: Secondary | ICD-10-CM | POA: Insufficient documentation

## 2023-08-29 DIAGNOSIS — Z89512 Acquired absence of left leg below knee: Secondary | ICD-10-CM | POA: Insufficient documentation

## 2023-08-29 DIAGNOSIS — E1151 Type 2 diabetes mellitus with diabetic peripheral angiopathy without gangrene: Principal | ICD-10-CM | POA: Diagnosis present

## 2023-08-29 DIAGNOSIS — E1122 Type 2 diabetes mellitus with diabetic chronic kidney disease: Secondary | ICD-10-CM | POA: Insufficient documentation

## 2023-08-29 LAB — CBC
HCT: 36.6 % — ABNORMAL LOW (ref 39.0–52.0)
Hemoglobin: 12.1 g/dL — ABNORMAL LOW (ref 13.0–17.0)
MCH: 33.7 pg (ref 26.0–34.0)
MCHC: 33.1 g/dL (ref 30.0–36.0)
MCV: 101.9 fL — ABNORMAL HIGH (ref 80.0–100.0)
Platelets: 210 10*3/uL (ref 150–400)
RBC: 3.59 MIL/uL — ABNORMAL LOW (ref 4.22–5.81)
RDW: 14.3 % (ref 11.5–15.5)
WBC: 7 10*3/uL (ref 4.0–10.5)
nRBC: 0 % (ref 0.0–0.2)

## 2023-08-29 LAB — BASIC METABOLIC PANEL WITH GFR
Anion gap: 18 — ABNORMAL HIGH (ref 5–15)
BUN: 57 mg/dL — ABNORMAL HIGH (ref 6–20)
CO2: 18 mmol/L — ABNORMAL LOW (ref 22–32)
Calcium: 9 mg/dL (ref 8.9–10.3)
Chloride: 96 mmol/L — ABNORMAL LOW (ref 98–111)
Creatinine, Ser: 12.88 mg/dL — ABNORMAL HIGH (ref 0.61–1.24)
GFR, Estimated: 4 mL/min — ABNORMAL LOW (ref >60)
Glucose, Bld: 370 mg/dL — ABNORMAL HIGH (ref 70–99)
Potassium: 4.8 mmol/L (ref 3.5–5.1)
Sodium: 132 mmol/L — ABNORMAL LOW (ref 135–145)

## 2023-08-29 NOTE — ED Triage Notes (Signed)
 Pt gets HD MWF and had a full treatment on Wednesday but today when he went to HD center the left upper arm graft did not work so he was advised to come to ED.  No CP or sob.

## 2023-08-29 NOTE — ED Provider Notes (Signed)
 [address] EMERGENCY DEPARTMENT AT [facility] Provider Note   CSN: [id number] Arrival date & time: 08/29/23  1811     History  Chief Complaint  Patient presents with   Needs Dialysis   Vascular Access Problem    HD graft did not function    Alvin Daniels is a 53 y.o. male.  53 yo M w/ h/o ESRD on dialysis in LUE graft. Usually gets on M/W/F, most recently on Wednesday. Today the graft didn't work so they sent him here. No sob, cp. No other associated symptoms.         Home Medications Prior to Admission medications   Medication Sig Start Date End Date Taking? Authorizing Provider  bictegravir-emtricitabine -tenofovir  AF (BIKTARVY ) 50-200-25 MG TABS tablet Take 1 tablet by mouth daily. 04/16/23   [name], MD  Blood Glucose Monitoring Suppl (TRUE METRIX METER) w/Device KIT Use kit to check glucose 3 (three) times daily. 12/27/21   Newlin, Enobong, MD  doxycycline  (VIBRAMYCIN ) 100 MG capsule Take 1 capsule (100 mg total) by mouth 2 (two) times daily. 08/22/22   Volney Grumbles, PA-C  erythromycin  ophthalmic ointment Place into the left eye 2 (two) times daily. 08/03/22   Lesa Rape, MD  glucose blood (TRUE METRIX BLOOD GLUCOSE TEST) test strip use 1 test strip to check glucose 3 (three) times daily. 07/17/23   [name], MD  insulin  aspart protamine  - aspart (NOVOLOG  70/30 FLEXPEN) (70-30) 100 UNIT/ML FlexPen Inject 20 Units into the skin 2 (two) times daily. 07/17/23   [name], MD  insulin  aspart protamine - aspart (NOVOLOG  MIX 70/30) (70-30) 100 UNIT/ML injection Inject 0.2 mLs (20 Units total) into the skin 2 (two) times daily with a meal. 01/29/23   [name], MD  Insulin  Lispro Prot & Lispro (HUMALOG  MIX 75/25 KWIKPEN) (75-25) 100 UNIT/ML Kwikpen Inject 20 Units into the skin 2 (two) times daily. 07/17/23   [name], MD  insulin  NPH-regular Human (HUMULIN  70/30) (70-30) 100 UNIT/ML injection Inject 20 units subcutaneously  twice daily with meals 09/25/22   [name], MD  insulin  NPH-regular Human (HUMULIN  70/30) (70-30) 100 UNIT/ML injection Inject 15 unit subcutaneously twice a day as directed before meals 09/25/22   [name], MD  Insulin  Pen Needle 31G X 5 MM MISC Utilize as needed to inject insulin  sq as recommended (BID) 07/17/23   [name], MD  Insulin  Syringe-Needle U-100 (BD INSULIN  SYRINGE ULTRAFINE) 31G X 15/64" 0.5 ML MISC 1 each by Does not apply route 2 (two) times daily. 09/25/22   [name], MD  Insulin  Syringes, Disposable, U-100 0.5 ML MISC Utilize  as needed to inject insulin  01/29/23   [name], MD  iron  sucrose in sodium chloride  0.9 % 100 mL Iron  Sucrose (Venofer) 01/08/23 01/07/24  [provider]  loperamide  (IMODIUM  A-D) 2 MG tablet Take 1 tablet by mouth as directed for diarrhea 01/27/23     midodrine  (PROAMATINE ) 10 MG tablet Take 1 tablet by mouth three times a week as directed. Take 1 tablet 30 minutes prior to HD and 1 tablet during treatment 02/17/23     midodrine  (PROAMATINE ) 10 MG tablet Take 1 tablet by mouth three times a week as directed Take 1 tablet 30 minutes prior to HD and 1 tab during treatment 04/02/23     midodrine  (PROAMATINE ) 10 MG tablet Take 1 tablet (10 mg total) by mouth every Monday, Wednesday, and Friday before hemodialysis. 04/02/23   Charlynne Coombes, PA-C  midodrine  (PROAMATINE )  10 MG tablet Take 1 tablet (10 mg total) by mouth 3 (three) times a week. Take 1 tablet 30 minutes prior to HD and 1 tablet during treatment 04/09/23     midodrine  (PROAMATINE ) 10 MG tablet Take 1 tablet by mouth three times a week as directed. Take 1 tablet 30 minutes prior to HD and 1 tab during treatment. 04/16/23   [name], MD  midodrine  (PROAMATINE ) 10 MG tablet Take 1 tablet (10 mg total) by mouth 3 (three) times daily as needed for low blood pressure. SBP</=100. may take 30 min prior to dialysis and mid treatment as needed 08/06/23     mupirocin   ointment (BACTROBAN ) 2 % Place 1 Application into the nose 2 (two) times daily. 08/03/22   Lesa Rape, MD  Pitavastatin  Magnesium  4 MG TABS Take 1 tablet (4 mg total) by mouth daily. 04/16/23   [name], MD  prednisoLONE  acetate (PRED FORTE ) 1 % ophthalmic suspension Place 1 drop into the left eye 3 (three) times daily. 08/03/22   Lesa Rape, MD  TRUEplus Lancets 28G MISC Use to test 3 (three) times daily. 07/17/23   [name], MD  bictegravir-emtricitabine -tenofovir  AF (BIKTARVY ) 50-200-25 MG TABS tablet Take 1 tablet by mouth daily. 01/02/22   [name], MD      Allergies    Patient has no known allergies.    Review of Systems   Review of Systems  Physical Exam Updated Vital Signs BP (!) 150/84   Pulse 80   Temp 98.2 F (36.8 C) (Oral)   Resp 16   SpO2 100%  Physical Exam Vitals and nursing note reviewed.  Constitutional:      Appearance: He is well-developed.  HENT:     Head: Normocephalic and atraumatic.  Cardiovascular:     Rate and Rhythm: Normal rate.     Comments: No bruit or thrill in LUE graft. Hemostatic, mildly swollen, but unsure on baseline.  Pulmonary:     Effort: Pulmonary effort is normal. No respiratory distress.  Abdominal:     General: There is no distension.  Musculoskeletal:        General: Normal range of motion.     Cervical back: Normal range of motion.  Neurological:     Mental Status: He is alert.     ED Results / Procedures / Treatments   Labs (all labs ordered are listed, but only abnormal results are displayed) Labs Reviewed  CBC - Abnormal; Notable for the following components:      Result Value   RBC 3.59 (*)    Hemoglobin 12.1 (*)    HCT 36.6 (*)    MCV 101.9 (*)    All other components within normal limits  BASIC METABOLIC PANEL WITH GFR - Abnormal; Notable for the following components:   Sodium 132 (*)    Chloride 96 (*)    CO2 18 (*)    Glucose, Bld 370 (*)    BUN 57 (*)    Creatinine, Ser 12.88 (*)     GFR, Estimated 4 (*)    Anion gap 18 (*)    All other components within normal limits    EKG None  Radiology No results found.  Procedures Procedures    Medications Ordered in ED Medications - No data to display  ED Course/ Medical Decision Making/ A&P  Medical Decision Making Amount and/or Complexity of Data Reviewed Labs: ordered.  Risk Decision regarding hospitalization.   No dialysis today. Suspect thrombosed graft. No indication for emergent dialysis. D/w Dr. Edgardo Goodwill with vascular who said that usually nephrology handles these and calls them if they are needed. D/w Dr. Irene Mannheim who recommended calling back after US . Can't get US  until 0800. Will d/w [facility] for admit pending US , appropriate consultations and dialysis.  D/w Dr. Brice Campi for same.   Final Clinical Impression(s) / ED Diagnoses Final diagnoses:  None    Rx / DC Orders ED Discharge Orders     None         Dennis Hegeman, Reymundo Caulk, MD 08/30/23 [phone]

## 2023-08-29 NOTE — ED Provider Notes (Incomplete)
 [place] [facility] Provider Note   CSN: [id number] Arrival date & time: 08/29/23  1811     History {Add pertinent medical, surgical, social history, OB history to HPI:1} Chief Complaint  Patient presents with  . Needs Dialysis  . Vascular Access Problem    HD graft did not function    Alvin Daniels is a 53 y.o. male.  53 yo M w/ h/o ESRD on dialysis in LUE graft. Usually gets on M/W/F, most recently on Wednesday. Today the graft didn't work so they sent him here. No sob, cp. No other associated symptoms.         Home Medications Prior to Admission medications   Medication Sig Start Date End Date Taking? Authorizing Provider  bictegravir-emtricitabine -tenofovir  AF (BIKTARVY ) 50-200-25 MG TABS tablet Take 1 tablet by mouth daily. 04/16/23   [name], MD  Blood Glucose Monitoring Suppl (TRUE METRIX METER) w/Device KIT Use kit to check glucose 3 (three) times daily. 12/27/21   Newlin, Enobong, MD  doxycycline  (VIBRAMYCIN ) 100 MG capsule Take 1 capsule (100 mg total) by mouth 2 (two) times daily. 08/22/22   Volney Grumbles, PA-C  erythromycin  ophthalmic ointment Place into the left eye 2 (two) times daily. 08/03/22   Lesa Rape, MD  glucose blood (TRUE METRIX BLOOD GLUCOSE TEST) test strip use 1 test strip to check glucose 3 (three) times daily. 07/17/23   [name], MD  insulin  aspart protamine  - aspart (NOVOLOG  70/30 FLEXPEN) (70-30) 100 UNIT/ML FlexPen Inject 20 Units into the skin 2 (two) times daily. 07/17/23   [name], MD  insulin  aspart protamine - aspart (NOVOLOG  MIX 70/30) (70-30) 100 UNIT/ML injection Inject 0.2 mLs (20 Units total) into the skin 2 (two) times daily with a meal. 01/29/23   [name], MD  Insulin  Lispro Prot & Lispro (HUMALOG  MIX 75/25 KWIKPEN) (75-25) 100 UNIT/ML Kwikpen Inject 20 Units into the skin 2 (two) times daily. 07/17/23   [name], MD  insulin  NPH-regular Human  (HUMULIN  70/30) (70-30) 100 UNIT/ML injection Inject 20 units subcutaneously twice daily with meals 09/25/22   [name], MD  insulin  NPH-regular Human (HUMULIN  70/30) (70-30) 100 UNIT/ML injection Inject 15 unit subcutaneously twice a day as directed before meals 09/25/22   [name], MD  Insulin  Pen Needle 31G X 5 MM MISC Utilize as needed to inject insulin  sq as recommended (BID) 07/17/23   [name], MD  Insulin  Syringe-Needle U-100 (BD INSULIN  SYRINGE ULTRAFINE) 31G X 15/64" 0.5 ML MISC 1 each by Does not apply route 2 (two) times daily. 09/25/22   [name], MD  Insulin  Syringes, Disposable, U-100 0.5 ML MISC Utilize  as needed to inject insulin  01/29/23   [name], MD  iron  sucrose in sodium chloride  0.9 % 100 mL Iron  Sucrose (Venofer) 01/08/23 01/07/24  [provider]  loperamide  (IMODIUM  A-D) 2 MG tablet Take 1 tablet by mouth as directed for diarrhea 01/27/23     midodrine  (PROAMATINE ) 10 MG tablet Take 1 tablet by mouth three times a week as directed. Take 1 tablet 30 minutes prior to HD and 1 tablet during treatment 02/17/23     midodrine  (PROAMATINE ) 10 MG tablet Take 1 tablet by mouth three times a week as directed Take 1 tablet 30 minutes prior to HD and 1 tab during treatment 04/02/23     midodrine  (PROAMATINE ) 10 MG tablet Take 1 tablet (10 mg total) by mouth every Monday, Wednesday, and Friday before hemodialysis.  04/02/23   Charlynne Coombes, PA-C  midodrine  (PROAMATINE ) 10 MG tablet Take 1 tablet (10 mg total) by mouth 3 (three) times a week. Take 1 tablet 30 minutes prior to HD and 1 tablet during treatment 04/09/23     midodrine  (PROAMATINE ) 10 MG tablet Take 1 tablet by mouth three times a week as directed. Take 1 tablet 30 minutes prior to HD and 1 tab during treatment. 04/16/23   [name], MD  midodrine  (PROAMATINE ) 10 MG tablet Take 1 tablet (10 mg total) by mouth 3 (three) times daily as needed for low blood pressure. SBP</=100. may take 30 min  prior to dialysis and mid treatment as needed 08/06/23     mupirocin  ointment (BACTROBAN ) 2 % Place 1 Application into the nose 2 (two) times daily. 08/03/22   Lesa Rape, MD  Pitavastatin  Magnesium  4 MG TABS Take 1 tablet (4 mg total) by mouth daily. 04/16/23   [name], MD  prednisoLONE  acetate (PRED FORTE ) 1 % ophthalmic suspension Place 1 drop into the left eye 3 (three) times daily. 08/03/22   Lesa Rape, MD  TRUEplus Lancets 28G MISC Use to test 3 (three) times daily. 07/17/23   [name], MD  bictegravir-emtricitabine -tenofovir  AF (BIKTARVY ) 50-200-25 MG TABS tablet Take 1 tablet by mouth daily. 01/02/22   [name], MD      Allergies    Patient has no known allergies.    Review of Systems   Review of Systems  Physical Exam Updated Vital Signs BP (!) 150/84   Pulse 80   Temp 98.2 F (36.8 C) (Oral)   Resp 16   SpO2 100%  Physical Exam Vitals and nursing note reviewed.  Constitutional:      Appearance: He is well-developed.  HENT:     Head: Normocephalic and atraumatic.  Cardiovascular:     Rate and Rhythm: Normal rate.     Comments: No bruit or thrill in LUE graft. Hemostatic, mildly swollen, but unsure on baseline.  Pulmonary:     Effort: Pulmonary effort is normal. No respiratory distress.  Abdominal:     General: There is no distension.  Musculoskeletal:        General: Normal range of motion.     Cervical back: Normal range of motion.  Neurological:     Mental Status: He is alert.     ED Results / Procedures / Treatments   Labs (all labs ordered are listed, but only abnormal results are displayed) Labs Reviewed  CBC - Abnormal; Notable for the following components:      Result Value   RBC 3.59 (*)    Hemoglobin 12.1 (*)    HCT 36.6 (*)    MCV 101.9 (*)    All other components within normal limits  BASIC METABOLIC PANEL WITH GFR - Abnormal; Notable for the following components:   Sodium 132 (*)    Chloride 96 (*)    CO2 18 (*)     Glucose, Bld 370 (*)    BUN 57 (*)    Creatinine, Ser 12.88 (*)    GFR, Estimated 4 (*)    Anion gap 18 (*)    All other components within normal limits    EKG None  Radiology No results found.  Procedures Procedures  {Document cardiac monitor, telemetry assessment procedure when appropriate:1}  Medications Ordered in ED Medications - No data to display  ED Course/ Medical Decision Making/ A&P   {   Click  here for ABCD2, HEART and other calculatorsREFRESH Note before signing :1}                              Medical Decision Making Amount and/or Complexity of Data Reviewed Labs: ordered.   ***  {Document critical care time when appropriate:1} {Document review of labs and clinical decision tools ie heart score, Chads2Vasc2 etc:1}  {Document your independent review of radiology images, and any outside records:1} {Document your discussion with family members, caretakers, and with consultants:1} {Document social determinants of health affecting pt's care:1} {Document your decision making why or why not admission, treatments were needed:1} Final Clinical Impression(s) / ED Diagnoses Final diagnoses:  None    Rx / DC Orders ED Discharge Orders     None

## 2023-08-30 ENCOUNTER — Encounter (HOSPITAL_COMMUNITY): Payer: Self-pay | Admitting: Family Medicine

## 2023-08-30 ENCOUNTER — Observation Stay (HOSPITAL_BASED_OUTPATIENT_CLINIC_OR_DEPARTMENT_OTHER): Payer: Self-pay

## 2023-08-30 DIAGNOSIS — E1151 Type 2 diabetes mellitus with diabetic peripheral angiopathy without gangrene: Secondary | ICD-10-CM

## 2023-08-30 DIAGNOSIS — T82510A Breakdown (mechanical) of surgically created arteriovenous fistula, initial encounter: Secondary | ICD-10-CM

## 2023-08-30 DIAGNOSIS — Z21 Asymptomatic human immunodeficiency virus [HIV] infection status: Secondary | ICD-10-CM

## 2023-08-30 DIAGNOSIS — Z992 Dependence on renal dialysis: Secondary | ICD-10-CM

## 2023-08-30 DIAGNOSIS — T82898A Other specified complication of vascular prosthetic devices, implants and grafts, initial encounter: Secondary | ICD-10-CM | POA: Diagnosis present

## 2023-08-30 DIAGNOSIS — Z794 Long term (current) use of insulin: Secondary | ICD-10-CM

## 2023-08-30 DIAGNOSIS — N186 End stage renal disease: Secondary | ICD-10-CM

## 2023-08-30 LAB — BASIC METABOLIC PANEL WITH GFR
Anion gap: 19 — ABNORMAL HIGH (ref 5–15)
BUN: 64 mg/dL — ABNORMAL HIGH (ref 6–20)
CO2: 17 mmol/L — ABNORMAL LOW (ref 22–32)
Calcium: 8.7 mg/dL — ABNORMAL LOW (ref 8.9–10.3)
Chloride: 97 mmol/L — ABNORMAL LOW (ref 98–111)
Creatinine, Ser: 13.34 mg/dL — ABNORMAL HIGH (ref 0.61–1.24)
GFR, Estimated: 4 mL/min — ABNORMAL LOW (ref >60)
Glucose, Bld: 308 mg/dL — ABNORMAL HIGH (ref 70–99)
Potassium: 4.8 mmol/L (ref 3.5–5.1)
Sodium: 133 mmol/L — ABNORMAL LOW (ref 135–145)

## 2023-08-30 LAB — CBC
HCT: 36.6 % — ABNORMAL LOW (ref 39.0–52.0)
Hemoglobin: 12.3 g/dL — ABNORMAL LOW (ref 13.0–17.0)
MCH: 34.1 pg — ABNORMAL HIGH (ref 26.0–34.0)
MCHC: 33.6 g/dL (ref 30.0–36.0)
MCV: 101.4 fL — ABNORMAL HIGH (ref 80.0–100.0)
Platelets: 213 10*3/uL (ref 150–400)
RBC: 3.61 MIL/uL — ABNORMAL LOW (ref 4.22–5.81)
RDW: 14.3 % (ref 11.5–15.5)
WBC: 7.2 10*3/uL (ref 4.0–10.5)
nRBC: 0 % (ref 0.0–0.2)

## 2023-08-30 LAB — CBG MONITORING, ED: Glucose-Capillary: 273 mg/dL — ABNORMAL HIGH (ref 70–99)

## 2023-08-30 LAB — GLUCOSE, CAPILLARY
Glucose-Capillary: 168 mg/dL — ABNORMAL HIGH (ref 70–99)
Glucose-Capillary: 193 mg/dL — ABNORMAL HIGH (ref 70–99)

## 2023-08-30 MED ORDER — INSULIN ASPART 100 UNIT/ML IJ SOLN
0.0000 [IU] | INTRAMUSCULAR | Status: DC
  Administered 2023-08-30: 1 [IU] via SUBCUTANEOUS
  Administered 2023-08-30: 3 [IU] via SUBCUTANEOUS

## 2023-08-30 MED ORDER — INSULIN ASPART 100 UNIT/ML IJ SOLN: 0.0000 [IU] | Freq: Three times a day (TID) | INTRAMUSCULAR | Status: DC

## 2023-08-30 MED ORDER — PROCHLORPERAZINE EDISYLATE 10 MG/2ML IJ SOLN: 5.0000 mg | Freq: Four times a day (QID) | INTRAMUSCULAR | Status: DC | PRN

## 2023-08-30 MED ORDER — PITAVASTATIN MAGNESIUM 4 MG PO TABS: 4.0000 mg | ORAL_TABLET | Freq: Every day | ORAL | Status: DC

## 2023-08-30 MED ORDER — BICTEGRAVIR-EMTRICITAB-TENOFOV 50-200-25 MG PO TABS: 1.0000 | ORAL_TABLET | Freq: Every day | ORAL | Status: DC

## 2023-08-30 MED ORDER — SENNA 8.6 MG PO TABS: 1.0000 | ORAL_TABLET | Freq: Every day | ORAL | Status: DC | PRN

## 2023-08-30 MED ORDER — HEPARIN SODIUM (PORCINE) 5000 UNIT/ML IJ SOLN
5000.0000 [IU] | Freq: Three times a day (TID) | INTRAMUSCULAR | Status: DC
  Administered 2023-08-30: 5000 [IU] via SUBCUTANEOUS
  Filled 2023-08-30: qty 1

## 2023-08-30 MED ORDER — ACETAMINOPHEN 325 MG PO TABS: 650.0000 mg | ORAL_TABLET | Freq: Four times a day (QID) | ORAL | Status: DC | PRN

## 2023-08-30 MED ORDER — MIDODRINE HCL 5 MG PO TABS: 10.0000 mg | ORAL_TABLET | ORAL | Status: DC

## 2023-08-30 MED ORDER — BICTEGRAVIR-EMTRICITAB-TENOFOV 50-200-25 MG PO TABS
1.0000 | ORAL_TABLET | Freq: Every day | ORAL | Status: DC
  Administered 2023-08-30: 1 via ORAL
  Filled 2023-08-30 (×2): qty 1

## 2023-08-30 MED ORDER — INSULIN GLARGINE-YFGN 100 UNIT/ML ~~LOC~~ SOLN
15.0000 [IU] | Freq: Every day | SUBCUTANEOUS | Status: DC
  Administered 2023-08-30: 15 [IU] via SUBCUTANEOUS
  Filled 2023-08-30: qty 0.15

## 2023-08-30 MED ORDER — ACETAMINOPHEN 650 MG RE SUPP: 650.0000 mg | Freq: Four times a day (QID) | RECTAL | Status: DC | PRN

## 2023-08-30 NOTE — Progress Notes (Signed)
 VASCULAR LAB    Left upper extremity duplex of AVF has been performed.  See CV proc for preliminary results.   Magin Balbi, RVT 08/30/2023, 9:25 AM

## 2023-08-30 NOTE — Progress Notes (Signed)
 Asked to see pt for clotted L arm AVF. Pt seen in room. We had called [facility] who agreed to put him on schedule for declot/ etc, on Monday as inpatient. Then we contacted [facility] and they offered him a spot on Monday  4/21 as outpatient at 7:30 am. Pt should come to hospital at 6:30 am. Needs a driver for going home after.  Pt is stable clinically w/o vol overload and K+ is wnl. Pt is okay for discharge from renal standpoint.   Larry Poag  MD  [facility] 08/30/2023, 2:08 PM  Recent Labs  Lab 08/29/23 1933 08/30/23 0338  HGB 12.1* 12.3*  CALCIUM  9.0 8.7*  CREATININE 12.88* 13.34*  K 4.8 4.8    Inpatient medications:  [START ON 08/31/2023] bictegravir-emtricitabine -tenofovir  AF  1 tablet Oral q1800   heparin   5,000 Units Subcutaneous Q8H   insulin  aspart  0-6 Units Subcutaneous TID WC   insulin  glargine-yfgn  15 Units Subcutaneous Daily    acetaminophen  **OR** acetaminophen , prochlorperazine , senna

## 2023-08-30 NOTE — Plan of Care (Signed)

## 2023-08-30 NOTE — H&P (Signed)
 History and Physical    Donterrius Shella Daniels GNF:[id number] DOB: [birth year] DOA: 08/29/2023  PCP: [name], MD   Patient coming from: Home   Chief Complaint: Dialysis access not working   HPI: Alvin Daniels is a 53 y.o. male with medical history significant for insulin -dependent diabetes mellitus, HIV, bilateral BKA, and ESRD on hemodialysis who presents for a problem with his fistula.  Patient had LUE brachiocephalic arteriovenous fistula creation and ligation of radiocephalic fistula in 2017.   He reports completing dialysis without incident on 08/27/2023 but he was unable to be dialyzed as schedule yesterday due to malfunction in his access.  He was sent to the ED for evaluation and management of this.  He denies any pain or new swelling or redness surrounding the access, denies shortness of breath, and denies swelling, chest pain, fever, or chills.  ED Course: Upon arrival to the ED, patient is found to be afebrile and saturating well on room air with normal RR, normal HR, and stable BP.  Labs are most notable for glucose 270, bicarbonate 18, BUN 57, normal WBC, and hemoglobin 12.1.  ED physician discussed the case with vascular surgery (Dr. Edgardo Goodwill) who indicated that nephrology typically handles these issues and will consult vascular surgery if they need to.  Nephrology (Dr. Irene Mannheim) was consulted by the ED physician and advised obtaining vascular ultrasound and then notifying them when it is completed.  Review of Systems:  All other systems reviewed and apart from HPI, are negative.  Past Medical History:  Diagnosis Date   Acute bilateral low back pain 01/03/2021   AIDS ([name]) 11/22/2014   Back pain 06/09/2019   BKA stump complication ([name]) 06/09/2019   Chronic diarrhea    Chronic hepatitis C without hepatic coma ([name]) 11/22/2014   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[name])    Right   Corneal ulcer 12/18/2022   Descemetocele of left eye 12/18/2022    Diabetic neuropathy ([name])    ESRD (end stage renal disease) on dialysis ([name])    "TTS; don't remember street name" (05/03/2014)   Hepatitis C    HIV INFECTION 06/27/2010   Qualifier: Diagnosis of  By: Oretha Birch CMA ( AAMA), Jacqueline     Hypotension 06/02/2012   Metabolic bone disease 06/18/2012   MRSA infection    Normocytic anemia 06/17/2012   Pancreatitis    Pressure ulcer of BKA stump, stage 2 ([name]) 11/22/2014   Renal disorder    Renal insufficiency    Severe protein-calorie malnutrition ([name]) 06/17/2012   Uncontrolled diabetes mellitus with complications 06/27/2010   Annotation: uncontrolled Qualifier: Diagnosis of  By: Oretha Birch CMA Nonie Beady), Jacqueline     Vaccine counseling 05/29/2022    Past Surgical History:  Procedure Laterality Date   AMPUTATION Left 04/20/2014   Procedure: 3rd toe amputation, 4th Toe Amputation,  5th Toe Amputation;  Surgeon: Debhora Titus Ford, MD;  Location: [facility] OR;  Service: Orthopedics;  Laterality: Left;   AMPUTATION Left 05/02/2014   Procedure: Midfoot Amputation;  Surgeon: Jestine Bicknell Ford, MD;  Location[facility] OR;  Service: Orthopedics;  Laterality: Left;   AMPUTATION Left 06/17/2014   Procedure: AMPUTATION BELOW KNEE;  Surgeon: Nikos Anglemyer Ford, MD;  Location: [facility] OR;  Service: Orthopedics;  Laterality: Left;   AMPUTATION Right 09/04/2018   Procedure: RIGHT BELOW KNEE AMPUTATION;  Surgeon: Fidel Caggiano Ford, MD;  Location: [facility] OR;  Service: Orthopedics;  Laterality: Right;  RIGHT BELOW KNEE AMPUTATION   AV FISTULA PLACEMENT Left    AV FISTULA PLACEMENT  Left 05/10/2016   Procedure: Creation Left Arm Brachiocephalic Arteriovenous Fistula and Ligation of Radiocephalic Fistula;  Surgeon: [name], MD;  Location: [facility] OR;  Service: Vascular;  Laterality: Left;   FEMUR IM NAIL Left 08/17/2016   Procedure: INTRAMEDULLARY (IM) RETROGRADE FEMORAL NAILING;  Surgeon: Adah Acron, MD;  Location: [facility] OR;  Service: Orthopedics;  Laterality: Left;   FOOT AMPUTATION  THROUGH ANKLE Left 12/'21/2015   midfoot   IR AV DIALY SHUNT INTRO NEEDLE/INTRACATH INITIAL W/PTA/IMG LEFT  04/17/2023   IR GENERIC HISTORICAL Left 05/01/2016   IR THROMBECTOMY AV FISTULA W/THROMBOLYSIS/PTA INC/SHUNT/IMG LEFT 05/01/2016 [name], MD [facility]   IR GENERIC HISTORICAL  05/01/2016   IR US  GUIDE VASC ACCESS LEFT 05/01/2016 [name], MD [facility]   IR GENERIC HISTORICAL  05/07/2016   IR FLUORO GUIDE CV LINE RIGHT 05/07/2016 [name], DO [facility]   IR GENERIC HISTORICAL  05/07/2016   IR US  GUIDE VASC ACCESS RIGHT 05/07/2016 [name], DO [facility]   IR GENERIC HISTORICAL  05/22/2016   IR US  GUIDE VASC ACCESS RIGHT 05/22/2016 [name], MD [facility]   IR GENERIC HISTORICAL  05/22/2016   IR FLUORO GUIDE CV LINE RIGHT 05/22/2016 [name], MD [facility]   IR REMOVAL TUN CV CATH W/O FL  08/21/2016   IR US  GUIDE VASC ACCESS LEFT  04/17/2023   PERIPHERAL VASCULAR CATHETERIZATION Left 05/09/2016   Procedure: A/V Fistulagram;  Surgeon: [name], MD;  Location: [facility] INVASIVE CV LAB;  Service: Cardiovascular;  Laterality: Left;  arm   TEE WITHOUT CARDIOVERSION N/A 06/22/2018   Procedure: TRANSESOPHAGEAL ECHOCARDIOGRAM (TEE);  Surgeon: [name], MD;  Location: [facility] ENDOSCOPY;  Service: Cardiovascular;  Laterality: N/A;    Social History:   reports that he has never smoked. He has never used smokeless tobacco. He reports that he does not drink alcohol  and does not use drugs.  No Known Allergies  Family History  Problem Relation Age of Onset   Diabetes Mother    Diabetes Father      Prior to Admission medications   Medication Sig Start Date End Date Taking? Authorizing Provider  bictegravir-emtricitabine -tenofovir  AF (BIKTARVY ) 50-200-25 MG TABS tablet Take 1 tablet by mouth daily. 04/16/23   [name], MD  insulin  aspart protamine  - aspart (NOVOLOG  70/30 FLEXPEN) (70-30) 100 UNIT/ML FlexPen Inject 20 Units into  the skin 2 (two) times daily. 07/17/23   [name], MD  Insulin  Lispro Prot & Lispro (HUMALOG  MIX 75/25 KWIKPEN) (75-25) 100 UNIT/ML Kwikpen Inject 20 Units into the skin 2 (two) times daily. 07/17/23   [name], MD  insulin  NPH-regular Human (HUMULIN  70/30) (70-30) 100 UNIT/ML injection Inject 20 units subcutaneously twice daily with meals 09/25/22   [name], MD  loperamide  (IMODIUM  A-D) 2 MG tablet Take 1 tablet by mouth as directed for diarrhea 01/27/23     midodrine  (PROAMATINE ) 10 MG tablet Take 1 tablet by mouth three times a week as directed Take 1 tablet 30 minutes prior to HD and 1 tab during treatment 04/02/23     midodrine  (PROAMATINE ) 10 MG tablet Take 1 tablet by mouth three times a week as directed. Take 1 tablet 30 minutes prior to HD and 1 tab during treatment. Patient not taking: Reported on 08/30/2023 04/16/23   Ernie Heal, Jerelyn Money, MD  midodrine  (PROAMATINE ) 10 MG tablet Take 1 tablet (10 mg total) by mouth 3 (three) times daily as needed for low blood pressure. SBP</=100.  may take 30 min prior to dialysis and mid treatment as needed 08/06/23     mupirocin  ointment (BACTROBAN ) 2 % Place 1 Application into the nose 2 (two) times daily. 08/03/22   Lesa Rape, MD  Pitavastatin  Magnesium  4 MG TABS Take 1 tablet (4 mg total) by mouth daily. 04/16/23   [name], MD  bictegravir-emtricitabine -tenofovir  AF (BIKTARVY ) 50-200-25 MG TABS tablet Take 1 tablet by mouth daily. 01/02/22   [name], MD    Physical Exam: Vitals:   08/29/23 2124 08/29/23 2255 08/30/23 0030 08/30/23 0054  BP: 121/74 (!) 150/84 131/82   Pulse: 80 80 68   Resp: 18 16 16    Temp: 98.2 F (36.8 C)   98.3 F (36.8 C)  TempSrc: Oral   Oral  SpO2: 99% 100% 99%     Constitutional: NAD, calm  Eyes: Left corneal opacity. Conjunctivae normal ENMT: Mucous membranes are moist. Posterior pharynx clear of any exudate or lesions.   Neck: supple, no masses  Respiratory: no wheezing, no  crackles. No accessory muscle use.  Cardiovascular: S1 & S2 heard, regular rate and rhythm. No extremity edema.   Abdomen: No distension, no tenderness, soft. Bowel sounds active.  Musculoskeletal: no clubbing / cyanosis. S/p b/l BKA.   Skin: no significant rashes, lesions, ulcers. Warm, dry, well-perfused. Neurologic: CN 2-12 grossly intact. Moving all extremities. Alert and oriented.  Psychiatric: Pleasant. Cooperative.    Labs and Imaging on Admission: I have personally reviewed following labs and imaging studies  CBC: Recent Labs  Lab 08/29/23 1933 08/30/23 0338  WBC 7.0 7.2  HGB 12.1* 12.3*  HCT 36.6* 36.6*  MCV 101.9* 101.4*  PLT 210 213   Basic Metabolic Panel: Recent Labs  Lab 08/29/23 1933 08/30/23 0338  NA 132* 133*  K 4.8 4.8  CL 96* 97*  CO2 18* 17*  GLUCOSE 370* 308*  BUN 57* 64*  CREATININE 12.88* 13.34*  CALCIUM  9.0 8.7*   GFR: CrCl cannot be calculated (Unknown ideal weight.). Liver Function Tests: No results for input(s): "AST", "ALT", "ALKPHOS", "BILITOT", "PROT", "ALBUMIN " in the last 168 hours. No results for input(s): "LIPASE", "AMYLASE" in the last 168 hours. No results for input(s): "AMMONIA" in the last 168 hours. Coagulation Profile: No results for input(s): "INR", "PROTIME" in the last 168 hours. Cardiac Enzymes: No results for input(s): "CKTOTAL", "CKMB", "CKMBINDEX", "TROPONINI" in the last 168 hours. BNP (last 3 results) No results for input(s): "PROBNP" in the last 8760 hours. HbA1C: No results for input(s): "HGBA1C" in the last 72 hours. CBG: No results for input(s): "GLUCAP" in the last 168 hours. Lipid Profile: No results for input(s): "CHOL", "HDL", "LDLCALC", "TRIG", "CHOLHDL", "LDLDIRECT" in the last 72 hours. Thyroid Function Tests: No results for input(s): "TSH", "T4TOTAL", "FREET4", "T3FREE", "THYROIDAB" in the last 72 hours. Anemia Panel: No results for input(s): "VITAMINB12", "FOLATE", "FERRITIN", "TIBC", "IRON ",  "RETICCTPCT" in the last 72 hours. Urine analysis:    Component Value Date/Time   COLORURINE YELLOW 07/04/2010 1840   APPEARANCEUR TURBID (A) 07/04/2010 1840   LABSPEC 1.013 07/04/2010 1840   PHURINE 7.0 07/04/2010 1840   GLUCOSEU NEG mg/dL 16/02/9603 5409   HGBUR TRACE (A) 03/13/2010 1824   BILIRUBINUR NEG 07/04/2010 1840   KETONESUR NEG mg/dL 81/19/1478 2956   PROTEINUR > 300 mg/dL (A) 21/30/8657 8469   UROBILINOGEN 0.2 07/04/2010 1840   NITRITE NEG 07/04/2010 1840   LEUKOCYTESUR LARGE (A) 07/04/2010 1840   Sepsis Labs: @LABRCNTIP (procalcitonin:4,lacticidven:4) )No results found for this or  any previous visit (from the past 240 hours).   Radiological Exams on Admission: No results found.   Assessment/Plan   1. Problem with dialysis access; ESRD  - No apparent indication for urgent HD    - Check vascular US  and then contact nephrology for next steps    2. HIV  - CD4 was 315 and VL 27 in December 2024  - Continue Biktarvy     3. Insulin -dependent DM  - A1c was 10.0% in March 2025  - Check CBGs, use Semglee  and sliding-scale Novolog  for now     DVT prophylaxis: sq heparin   Code Status: Full  Level of Care: Level of care: Med-Surg Family Communication: None present Disposition Plan:  Patient is from: home  Anticipated d/c is to: home  Anticipated d/c date is: 4/20 or 09/01/23  Patient currently: Pending US , then plan per nephrology  Consults called: ED physician discussed with nephrology and vascular surgery  Admission status: Observation     Walton Guppy, MD [facility]  08/30/2023, 4:20 AM

## 2023-08-30 NOTE — Discharge Summary (Signed)
 Physician Discharge Summary  Alvin Daniels ZOX:[id number] DOB: 01-Mar-1971 DOA: 08/29/2023  PCP: [name], MD  Admit date: 08/29/2023 Discharge date: 08/30/23  Admitted From: Home Disposition: Home Recommendations for Outpatient Follow-up:  Outpatient follow-up with [facility] on Monday, 4/21 at 7:30 AM for dialysis.  Per nephrology, patient to present to hospital at 6:30 AM.  Check CMP and CBC Please follow up on the following pending results: None  Home Health: No need identified Equipment/Devices: No need identified  Discharge Condition: Stable CODE STATUS: Full code   Hospital course 53 year old M with PMH of ESRD on HD MWF, IDDM-2, HIV and bilateral BKA presenting with dialysis fistula malfunction.  Last dialysis on 4/16.  Unable to dialyze on 4/18.  Venous Doppler showed arteriovenous fistula thrombus.  Seen by nephrology who has arranged outpatient follow-up with [facility] on Monday,/20 05/2023 at 7:30 AM for dialysis access issue.  Per nephrology, patient to present to the hospital at 6:30 AM.  No emergent need for dialysis.  He was cleared for discharge by nephrology.  See individual problem list below for more.   Problems addressed during this hospitalization ESRD on HD MWF-last HD on 4/16.  Not able to do HD on 4/18 due to problem with fistula. AV fistula thrombus -Management as above.   HIV: CD4 was 315 and VL 27 in December 2024  -Continue home Biktarvy      Poorly controlled IDDM-2 with hyperglycemia: A1c 10.0% on 07/17/2023.  Continue home insulin .   Bilateral BKA: Uses prosthesis.           Time spent 35 minutes  Vital signs Vitals:   08/30/23 0054 08/30/23 0529 08/30/23 0530 08/30/23 0600  BP:   122/80 107/75  Pulse:   74 75  Temp: 98.3 F (36.8 C) 98.1 F (36.7 C)    Resp:   18   SpO2:   99% 100%  TempSrc: Oral Oral       Discharge exam  GENERAL: No apparent distress.  Nontoxic. HEENT:  MMM.  Vision and hearing grossly intact.  NECK: Supple.  No apparent JVD.  RESP:  No IWOB.  Fair aeration bilaterally.  Soft bruit over AV fistula CVS:  RRR. Heart sounds normal.  ABD/GI/GU: BS+. Abd soft, NTND.  MSK/EXT: Bilateral BKA. SKIN: no apparent skin lesion or wound NEURO: Awake, alert and oriented appropriately.  No apparent focal neuro deficit. PSYCH: Calm. Normal affect.   Discharge Instructions Discharge Instructions     Diet - low sodium heart healthy   Complete by: As directed    Diet renal/carb modified with fluid restriction Diet-HS Snack? Nothing; Fluid restriction: 1200 mL Fluid; Room service appropriate? Yes; Fluid consistency:   Diet Carb Modified   Complete by: As directed    Diet renal/carb modified with fluid restriction Diet-HS Snack? Nothing; Fluid restriction: 1200 mL Fluid; Room service appropriate? Yes; Fluid consistency:   Discharge instructions   Complete by: As directed    It has been a pleasure taking care of you!  You were hospitalized due to problem with dialysis fistula likely from thrombus (blood clot).  Follow instruction by nephrology to address this outpatient.    Take care,   Increase activity slowly   Complete by: As directed       Allergies as of 08/30/2023   No Known Allergies      Medication List     TAKE these medications    Biktarvy  50-200-25 MG Tabs tablet Generic drug: bictegravir-emtricitabine -tenofovir  AF Take  1 tablet by mouth daily.   NovoLOG  70/30 FlexPen (70-30) 100 UNIT/ML FlexPen Generic drug: insulin  aspart protamine  - aspart Inject 20 Units into the skin 2 (two) times daily. What changed: how much to take        Consultations: Nephrology  Procedures/Studies:   VAS US  DUPLEX DIALYSIS ACCESS (AVF, AVG) Result Date: 08/30/2023 DIALYSIS ACCESS Patient Name:  Alvin Daniels [facility]  Date of Exam:   08/30/2023 Medical Rec #: [id number]                         Accession #:    [id number] Date of  Birth: Mar 13, 1971                          Patient Gender: M Patient Age:   15 years Exam Location:  [facility] Procedure:      VAS US  DUPLEX DIALYSIS ACCESS (AVF, AVG) Referring Phys: TIMOTHY OPYD --------------------------------------------------------------------------------  Reason for Exam: Unable to dialyze through AVF/AVG, no palpable thrill. Access Site: Left Upper Extremity. Access Type: Brachial-cephalic AVF. History: History of ligated radiocephalic fistula 86/57/84. Diabetes. HIV.          Bilateral BKA. Comparison Study: No prior study on file Performing Technologist: Carleene Chase RVS  Examination Guidelines: A complete evaluation includes B-mode imaging, spectral Doppler, color Doppler, and power Doppler as needed of all accessible portions of each vessel. Unilateral testing is considered an integral part of a complete examination. Limited examinations for reoccurring indications may be performed as noted.  Findings:  +------------+----------+-------------+----------+--------+ OUTFLOW VEINPSV (cm/s)Diameter (cm)Depth (cm)Describe +------------+----------+-------------+----------+--------+ Shoulder                                     occluded +------------+----------+-------------+----------+--------+ Prox UA                                      occluded +------------+----------+-------------+----------+--------+ Mid UA                                       occluded +------------+----------+-------------+----------+--------+ Dist UA                                      occluded +------------+----------+-------------+----------+--------+ AC Fossa                                     occluded +------------+----------+-------------+----------+--------+   Summary: Arteriovenous fistula-Thrombus noted. *See table(s) above for measurements and observations.     --------------------------------------------------------------------------------   Preliminary         The results of significant diagnostics from this hospitalization (including imaging, microbiology, ancillary and laboratory) are listed below for reference.     Microbiology: No results found for this or any previous visit (from the past 240 hours).   Labs:  CBC: Recent Labs  Lab 08/29/23 1933 08/30/23 0338  WBC 7.0 7.2  HGB 12.1* 12.3*  HCT 36.6* 36.6*  MCV 101.9* 101.4*  PLT 210 213   BMP &GFR Recent Labs  Lab 08/29/23 1933 08/30/23 0338  NA 132* 133*  K 4.8 4.8  CL 96* 97*  CO2 18* 17*  GLUCOSE 370* 308*  BUN 57* 64*  CREATININE 12.88* 13.34*  CALCIUM  9.0 8.7*   CrCl cannot be calculated (Unknown ideal weight.). Liver & Pancreas: No results for input(s): "AST", "ALT", "ALKPHOS", "BILITOT", "PROT", "ALBUMIN " in the last 168 hours. No results for input(s): "LIPASE", "AMYLASE" in the last 168 hours. No results for input(s): "AMMONIA" in the last 168 hours. Diabetic: No results for input(s): "HGBA1C" in the last 72 hours. Recent Labs  Lab 08/30/23 0439 08/30/23 0951 08/30/23 1146  GLUCAP 273* 168* 193*   Cardiac Enzymes: No results for input(s): "CKTOTAL", "CKMB", "CKMBINDEX", "TROPONINI" in the last 168 hours. No results for input(s): "PROBNP" in the last 8760 hours. Coagulation Profile: No results for input(s): "INR", "PROTIME" in the last 168 hours. Thyroid Function Tests: No results for input(s): "TSH", "T4TOTAL", "FREET4", "T3FREE", "THYROIDAB" in the last 72 hours. Lipid Profile: No results for input(s): "CHOL", "HDL", "LDLCALC", "TRIG", "CHOLHDL", "LDLDIRECT" in the last 72 hours. Anemia Panel: No results for input(s): "VITAMINB12", "FOLATE", "FERRITIN", "TIBC", "IRON ", "RETICCTPCT" in the last 72 hours. Urine analysis:    Component Value Date/Time   COLORURINE YELLOW 07/04/2010 1840   APPEARANCEUR TURBID (A) 07/04/2010 1840   LABSPEC 1.013 07/04/2010 1840   PHURINE 7.0 07/04/2010 1840   GLUCOSEU NEG mg/dL 19/14/7829 5621   HGBUR  TRACE (A) 03/13/2010 1824   BILIRUBINUR NEG 07/04/2010 1840   KETONESUR NEG mg/dL 30/86/5784 6962   PROTEINUR > 300 mg/dL (A) 95/28/4132 4401   UROBILINOGEN 0.2 07/04/2010 1840   NITRITE NEG 07/04/2010 1840   LEUKOCYTESUR LARGE (A) 07/04/2010 1840   Sepsis Labs: Invalid input(s): "PROCALCITONIN", "LACTICIDVEN"   SIGNED:  [name] Finger, MD  [facility] 08/30/2023, 3:59 PM

## 2023-08-30 NOTE — Progress Notes (Signed)
 PROGRESS NOTE  Alvin Daniels ZOX:[id number] DOB: 1971/05/08   PCP: [name], MD  Patient is from: Home.  DOA: 08/29/2023 LOS: 0  Chief complaints Chief Complaint  Patient presents with   Needs Dialysis   Vascular Access Problem    HD graft did not function     Brief Narrative / Interim history: 53 year old M with PMH of ESRD on HD MWF, IDDM-2, HIV and bilateral BKA presenting with dialysis fistula malfunction.  Last dialysis on 4/16.  Unable to dialyze on 4/18.  Venous Doppler showed arteriovenous fistula thrombus.  Subjective: Seen and examined earlier this morning with the help of video interpreter with ID number [id number].  No major events overnight of this morning.  No complaints.   Objective: Vitals:   08/30/23 0054 08/30/23 0529 08/30/23 0530 08/30/23 0600  BP:   122/80 107/75  Pulse:   74 75  Resp:   18   Temp: 98.3 F (36.8 C) 98.1 F (36.7 C)    TempSrc: Oral Oral    SpO2:   99% 100%    Examination:  GENERAL: No apparent distress.  Nontoxic. HEENT: MMM.  Vision and hearing grossly intact.  NECK: Supple.  No apparent JVD.  RESP:  No IWOB.  Fair aeration bilaterally.  Soft bruit over AV fistula CVS:  RRR. Heart sounds normal.  ABD/GI/GU: BS+. Abd soft, NTND.  MSK/EXT: Bilateral BKA. SKIN: no apparent skin lesion or wound NEURO: Awake, alert and oriented appropriately.  No apparent focal neuro deficit. PSYCH: Calm. Normal affect.   Consultants:  Nephrology  Procedures: None  Microbiology summarized: None  Assessment and plan: ESRD on HD MWF-last HD on 4/16.  Not able to do HD on 4/18 due to problem with fistula. AV fistula thrombus -No indication for emergent dialysis. -Nephrology on board.   HIV: CD4 was 315 and VL 27 in December 2024  -Continue Biktarvy      Poorly controlled IDDM-2 with hyperglycemia: A1c 10.0% on 07/17/2023.  Uses NovoLog  20 units twice daily at home Recent Labs  Lab 08/30/23 0439 08/30/23 0951   GLUCAP 273* 168*  -Continue Semglee  15 units daily -SSI very sensitive.  Change to ACHS  Bilateral BKA: Uses prosthesis.   There is no height or weight on file to calculate BMI.          DVT prophylaxis:  heparin  injection 5,000 Units Start: 08/30/23 0600  Code Status: Full code Family Communication: None at bedside Level of care: Med-Surg Status is: Observation The patient will require care spanning > 2 midnights and should be moved to inpatient because: Dialysis access malfunction   Final disposition: Home   35 minutes with more than 50% spent in reviewing records, counseling patient/family and coordinating care.   Sch Meds:  Scheduled Meds:  bictegravir-emtricitabine -tenofovir  AF  1 tablet Oral Daily   heparin   5,000 Units Subcutaneous Q8H   insulin  aspart  0-6 Units Subcutaneous Q4H   insulin  glargine-yfgn  15 Units Subcutaneous Daily   Continuous Infusions: PRN Meds:.acetaminophen  **OR** acetaminophen , prochlorperazine , senna  Antimicrobials: Anti-infectives (From admission, onward)    Start     Dose/Rate Route Frequency Ordered Stop   08/30/23 1000  bictegravir-emtricitabine -tenofovir  AF (BIKTARVY ) 50-200-25 MG per tablet 1 tablet        1 tablet Oral Daily 08/30/23 0326          I have personally reviewed the following labs and images: CBC: Recent Labs  Lab 08/29/23 1933 08/30/23 0338  WBC 7.0 7.2  HGB 12.1* 12.3*  HCT 36.6* 36.6*  MCV 101.9* 101.4*  PLT 210 213   BMP &GFR Recent Labs  Lab 08/29/23 1933 08/30/23 0338  NA 132* 133*  K 4.8 4.8  CL 96* 97*  CO2 18* 17*  GLUCOSE 370* 308*  BUN 57* 64*  CREATININE 12.88* 13.34*  CALCIUM  9.0 8.7*   CrCl cannot be calculated (Unknown ideal weight.). Liver & Pancreas: No results for input(s): "AST", "ALT", "ALKPHOS", "BILITOT", "PROT", "ALBUMIN " in the last 168 hours. No results for input(s): "LIPASE", "AMYLASE" in the last 168 hours. No results for input(s): "AMMONIA" in the last 168  hours. Diabetic: No results for input(s): "HGBA1C" in the last 72 hours. Recent Labs  Lab 08/30/23 0439 08/30/23 0951  GLUCAP 273* 168*   Cardiac Enzymes: No results for input(s): "CKTOTAL", "CKMB", "CKMBINDEX", "TROPONINI" in the last 168 hours. No results for input(s): "PROBNP" in the last 8760 hours. Coagulation Profile: No results for input(s): "INR", "PROTIME" in the last 168 hours. Thyroid Function Tests: No results for input(s): "TSH", "T4TOTAL", "FREET4", "T3FREE", "THYROIDAB" in the last 72 hours. Lipid Profile: No results for input(s): "CHOL", "HDL", "LDLCALC", "TRIG", "CHOLHDL", "LDLDIRECT" in the last 72 hours. Anemia Panel: No results for input(s): "VITAMINB12", "FOLATE", "FERRITIN", "TIBC", "IRON ", "RETICCTPCT" in the last 72 hours. Urine analysis:    Component Value Date/Time   COLORURINE YELLOW 07/04/2010 1840   APPEARANCEUR TURBID (A) 07/04/2010 1840   LABSPEC 1.013 07/04/2010 1840   PHURINE 7.0 07/04/2010 1840   GLUCOSEU NEG mg/dL 16/02/9603 5409   HGBUR TRACE (A) 03/13/2010 1824   BILIRUBINUR NEG 07/04/2010 1840   KETONESUR NEG mg/dL 81/19/1478 2956   PROTEINUR > 300 mg/dL (A) 21/30/8657 8469   UROBILINOGEN 0.2 07/04/2010 1840   NITRITE NEG 07/04/2010 1840   LEUKOCYTESUR LARGE (A) 07/04/2010 1840   Sepsis Labs: Invalid input(s): "PROCALCITONIN", "LACTICIDVEN"  Microbiology: No results found for this or any previous visit (from the past 240 hours).  Radiology Studies: VAS US  DUPLEX DIALYSIS ACCESS (AVF, AVG) Result Date: 08/30/2023 DIALYSIS ACCESS Patient Name:  Alvin Daniels [facility]  Date of Exam:   08/30/2023 Medical Rec #: [id number]                         Accession #:    [id number] Date of Birth: Jan 11, 1971                          Patient Gender: M Patient Age:   53 years Exam Location:  [facility] Procedure:      VAS US  DUPLEX DIALYSIS ACCESS (AVF, AVG) Referring Phys: TIMOTHY OPYD  --------------------------------------------------------------------------------  Reason for Exam: Unable to dialyze through AVF/AVG, no palpable thrill. Access Site: Left Upper Extremity. Access Type: Brachial-cephalic AVF. History: History of ligated radiocephalic fistula 36/64/40. Diabetes. HIV.          Bilateral BKA. Comparison Study: No prior study on file Performing Technologist: Carleene Chase RVS  Examination Guidelines: A complete evaluation includes B-mode imaging, spectral Doppler, color Doppler, and power Doppler as needed of all accessible portions of each vessel. Unilateral testing is considered an integral part of a complete examination. Limited examinations for reoccurring indications may be performed as noted.  Findings:  +------------+----------+-------------+----------+--------+ OUTFLOW VEINPSV (cm/s)Diameter (cm)Depth (cm)Describe +------------+----------+-------------+----------+--------+ Shoulder  occluded +------------+----------+-------------+----------+--------+ Prox UA                                      occluded +------------+----------+-------------+----------+--------+ Mid UA                                       occluded +------------+----------+-------------+----------+--------+ Dist UA                                      occluded +------------+----------+-------------+----------+--------+ AC Fossa                                     occluded +------------+----------+-------------+----------+--------+   Summary: Arteriovenous fistula-Thrombus noted. *See table(s) above for measurements and observations.     --------------------------------------------------------------------------------   Preliminary       Gowri Suchan T. Nareg Breighner [facility]  If 7PM-7AM, please contact night-coverage [email or web address] 08/30/2023, 12:27 PM

## 2023-09-01 ENCOUNTER — Ambulatory Visit (HOSPITAL_COMMUNITY)
Admission: AD | Admit: 2023-09-01 | Discharge: 2023-09-01 | Disposition: A | Discharge status: Home / Self Care | Payer: Self-pay | Source: Ambulatory Visit | Attending: Nephrology | Admitting: Nephrology

## 2023-09-01 ENCOUNTER — Other Ambulatory Visit: Payer: Self-pay

## 2023-09-01 ENCOUNTER — Encounter (HOSPITAL_COMMUNITY): Payer: Self-pay | Admitting: Nephrology

## 2023-09-01 ENCOUNTER — Encounter (HOSPITAL_COMMUNITY)
Admission: AD | Disposition: A | Discharge status: Home / Self Care | Payer: Self-pay | Source: Ambulatory Visit | Attending: Nephrology

## 2023-09-01 DIAGNOSIS — Z794 Long term (current) use of insulin: Secondary | ICD-10-CM | POA: Insufficient documentation

## 2023-09-01 DIAGNOSIS — Z992 Dependence on renal dialysis: Secondary | ICD-10-CM | POA: Insufficient documentation

## 2023-09-01 DIAGNOSIS — Z89511 Acquired absence of right leg below knee: Secondary | ICD-10-CM | POA: Insufficient documentation

## 2023-09-01 DIAGNOSIS — D631 Anemia in chronic kidney disease: Secondary | ICD-10-CM | POA: Insufficient documentation

## 2023-09-01 DIAGNOSIS — N250 Renal osteodystrophy: Secondary | ICD-10-CM | POA: Insufficient documentation

## 2023-09-01 DIAGNOSIS — Z21 Asymptomatic human immunodeficiency virus [HIV] infection status: Secondary | ICD-10-CM | POA: Insufficient documentation

## 2023-09-01 DIAGNOSIS — N186 End stage renal disease: Secondary | ICD-10-CM | POA: Insufficient documentation

## 2023-09-01 DIAGNOSIS — Z833 Family history of diabetes mellitus: Secondary | ICD-10-CM | POA: Insufficient documentation

## 2023-09-01 DIAGNOSIS — I120 Hypertensive chronic kidney disease with stage 5 chronic kidney disease or end stage renal disease: Secondary | ICD-10-CM | POA: Insufficient documentation

## 2023-09-01 DIAGNOSIS — Z89512 Acquired absence of left leg below knee: Secondary | ICD-10-CM | POA: Insufficient documentation

## 2023-09-01 DIAGNOSIS — E1122 Type 2 diabetes mellitus with diabetic chronic kidney disease: Secondary | ICD-10-CM | POA: Insufficient documentation

## 2023-09-01 DIAGNOSIS — Z79620 Long term (current) use of immunosuppressive biologic: Secondary | ICD-10-CM | POA: Insufficient documentation

## 2023-09-01 HISTORY — PX: DIALYSIS/PERMA CATHETER INSERTION: CATH118288

## 2023-09-01 LAB — POCT I-STAT, CHEM 8
BUN: 93 mg/dL — ABNORMAL HIGH (ref 6–20)
Calcium, Ion: 1.02 mmol/L — ABNORMAL LOW (ref 1.15–1.40)
Chloride: 106 mmol/L (ref 98–111)
Creatinine, Ser: 17.7 mg/dL — ABNORMAL HIGH (ref 0.61–1.24)
Glucose, Bld: 199 mg/dL — ABNORMAL HIGH (ref 70–99)
HCT: 35 % — ABNORMAL LOW (ref 39.0–52.0)
Hemoglobin: 11.9 g/dL — ABNORMAL LOW (ref 13.0–17.0)
Potassium: 4.4 mmol/L (ref 3.5–5.1)
Sodium: 138 mmol/L (ref 135–145)
TCO2: 15 mmol/L — ABNORMAL LOW (ref 22–32)

## 2023-09-01 LAB — GLUCOSE, CAPILLARY: Glucose-Capillary: 195 mg/dL — ABNORMAL HIGH (ref 70–99)

## 2023-09-01 SURGERY — DIALYSIS/PERMA CATHETER INSERTION
Anesthesia: LOCAL

## 2023-09-01 MED ORDER — MIDAZOLAM HCL 2 MG/2ML IJ SOLN
INTRAMUSCULAR | Status: DC | PRN
  Administered 2023-09-01: 1 mg via INTRAVENOUS

## 2023-09-01 MED ORDER — FENTANYL CITRATE (PF) 100 MCG/2ML IJ SOLN
INTRAMUSCULAR | Status: AC
  Filled 2023-09-01: qty 2

## 2023-09-01 MED ORDER — LIDOCAINE HCL (PF) 1 % IJ SOLN
INTRAMUSCULAR | Status: DC | PRN
  Administered 2023-09-01: 10 mL via SUBCUTANEOUS
  Administered 2023-09-01: 2 mL via SUBCUTANEOUS

## 2023-09-01 MED ORDER — MIDAZOLAM HCL 2 MG/2ML IJ SOLN
INTRAMUSCULAR | Status: AC
  Filled 2023-09-01: qty 2

## 2023-09-01 MED ORDER — HEPARIN (PORCINE) IN NACL 1000-0.9 UT/500ML-% IV SOLN
INTRAVENOUS | Status: DC | PRN
  Administered 2023-09-01 (×2): 500 mL

## 2023-09-01 MED ORDER — FENTANYL CITRATE (PF) 100 MCG/2ML IJ SOLN
INTRAMUSCULAR | Status: DC | PRN
  Administered 2023-09-01: 50 ug via INTRAVENOUS

## 2023-09-01 MED ORDER — HEPARIN SODIUM (PORCINE) 1000 UNIT/ML IJ SOLN
INTRAMUSCULAR | Status: DC | PRN
  Administered 2023-09-01 (×2): 1900 [IU] via INTRAVENOUS

## 2023-09-01 MED ORDER — LIDOCAINE HCL (PF) 1 % IJ SOLN
INTRAMUSCULAR | Status: AC
  Filled 2023-09-01: qty 30

## 2023-09-01 SURGICAL SUPPLY — 7 items
BAG SNAP BAND KOVER 36X36 (MISCELLANEOUS) IMPLANT
CATH PALINDROME-P 23 W/VT (CATHETERS) IMPLANT
COVER DOME SNAP 22 D (MISCELLANEOUS) IMPLANT
GLIDEWIRE STIFF .35X180X3 HYDR (WIRE) IMPLANT
SHEATH PROBE COVER 6X72 (BAG) IMPLANT
TRAY PV CATH (CUSTOM PROCEDURE TRAY) IMPLANT
WIRE MICRO SET SILHO 5FR 7 (SHEATH) IMPLANT

## 2023-09-01 NOTE — Op Note (Signed)
 Patient presents for tunneled catheter insertion given 2 large aneurysms loaded with thrombus and a 53 year old left brachiocephalic fistula which is thrombosed.  The decision was made to place a RIJ tunneled dialysis catheter.  Summary:  1) Successful placement of a new 23 cm cuff to tip hemodialysis catheter (Palindrome) in the right internal jugular vein with the tip in the right atrium.  2) Recommendations to the dialysis unit TO NOT USE HEPARIN  FOR THE FIRST 24 HOURS.  Description of procedure: The right neck, chest and the catheter were prepped and draped in the usual sterile fashion.  Local anesthesia was provided by injecting lidocaine  1% at the neck site overlying the desired venotomy site. Using real time ultrasound guidance, I was able to cannulate the RIJ and advance the guidewire easily into the central circulation. The wire was then manipulated and advanced into the abdominal IVC. I then blunt dissected the tissue around the 5 Fr stylet until it was freely mobile. Then, after injecting local anesthesia into the desired track and exit site I made a small incision at the exit site and tunneled a 23 cm cuff to tip Palindrome dialysis catheter, pulling it out at the venotomy site. Sequential dilation with 12, 14, and finally the peelaway sheath were done with real time fluoroscopic guidance ensuring that we were straight always lined with the wire.   Then, I inserted the catheter over the wire and through the sheath. After removing the peelaway sheath, I adjusted the catheter until the tip of the catheter was positioned in the right atrium of the heart. The cuff of the catheter was positioned in the subcutaneous tunnel with the cuff approximately 2 cm from the exit site.   Both limbs of the catheter were aspirated and flushed with excellent flow noted. Both limbs of the catheter were locked with heparin  and sterile caps placed. The hub of the catheter was sutured to the chest wall with 2-0 nylon  suture.   The neck incision was closed with a vertical mattress 3-0 plain gut sutures and a purse-string 3-0 plain gut was placed at the tunnel exit site. 2-0 loose nylon sutures secured the hub to the chest wall.                         Sterile dressings were placed, and the patient returned to recovery in stable condition.  Sedation: 1  mg Versed , 50  mcg Fentanyl . Sedation time: 14 minutes  Contrast 0 mL  Monitoring: Because of the patient's comorbid conditions and sedation during the procedure, continuous EKG monitoring and O2 saturation monitoring was performed throughout the procedure by the RN. There were no abnormal arrhythmias encountered.  Complications: None.  Diagnoses:   N18.6 End stage renal disease Z99.2 Dependence on renal dialysis  Procedures Coding:  36558 Tunneled catheter insertion [id number] Ultrasound guidance 16109  Fluoroscopy guidance for catheter insertion. U0454 Contrast  Recommendations: Remove the suture in 3 weeks. 2.   Report any blood flow problems to [facility].  Discharge: The patient was discharged home in stable condition. The patient was given education regarding the care of the catheter and specific instructions in case of any problems.

## 2023-09-01 NOTE — H&P (Addendum)
 Chief Complaint: Decreased flows  Interval H&P   The patient has presented today for tunneled catheter insertion to initiate dialysis.  Various methods of treatment have been discussed with the patient.  After consideration of risk, benefits and other options for treatment, the patient has consented to a tunneled catheter insertion.   Risks  of bleeding, pain, infection, nonhealing wound, lung and carotid artery injury were explained to the patient.  The patient's history has been reviewed and the patient has been examined, no changes in status.  Stable for tunneled catheter insertion.  I have reviewed the patient's chart and labs.  Questions were answered to the patient's satisfaction.  Assessment/Plan: ESRD dialyzing at [facility] with last treatment on April 16 for only 2 hours.  Patient has a history of signing off early and usually is always off the machine in well under 3 hrs. 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  Will place a tunneled catheter and refer to [facility] for a new access.  This current access is 53 years old.   Renal osteodystrophy - continue binders per home regimen. Anemia - managed with ESA's and IV iron  at dialysis center. HTN - resume home regimen.   HPI: Alvin Daniels is an 53 y.o. male with a history of HIV, BKA, diabetes, ESRD being referred for a thrombectomy of a left brachiocephalic fistula which is 53 years old, last intervention by [facility]IR April 17, 2023.   ROS Per HPI.  Chemistry and CBC: Creat  Date/Time Value Ref Range Status  06/06/2021 02:52 PM 4.88 (H) 0.70 - 1.30 mg/dL Final    Comment:    Verified by repeat analysis. Aaron Aas   05/15/2017 03:31 PM 6.19 (H) 0.60 - 1.35 mg/dL Final  16/02/9603 54:09 PM 4.44 (H) 0.60 - 1.35  mg/dL Final  81/19/1478 29:56 PM 4.38 (H) 0.60 - 1.35 mg/dL Final  21/30/8657 84:69 PM 5.39 (H) 0.50 - 1.35 mg/dL Final  62/95/2841 32:44 PM 6.78 (H) 0.50 - 1.35 mg/dL Final  05/15/7251 66:44 PM 7.31 (H) 0.50 - 1.35 mg/dL Final    Comment:    Result repeated and verified. No visible hemolysis.  03/29/2011 03:03 PM 6.40 (H) 0.50 - 1.35 mg/dL Final  03/47/4259 56:38 AM 5.45 (H) 0.50 - 1.35 mg/dL Final    Comment:    Result repeated and verified.   Creatinine, Ser  Date/Time Value Ref Range Status  08/30/2023 03:38 AM 13.34 (H) 0.61 - 1.24 mg/dL Final  75/64/3329 51:88 PM 12.88 (H) 0.61 - 1.24 mg/dL Final  41/66/0630 16:01 PM 7.64 (H) 0.61 - 1.24 mg/dL Final  09/32/3557 32:20 PM 6.60 (H) 0.61 - 1.24 mg/dL Final  25/42/7062 37:62 PM 5.92 (H) 0.61 - 1.24 mg/dL Final  83/15/1761 60:73 PM 5.04 (H) 0.61 - 1.24 mg/dL Final  71/10/2692 85:46 PM 9.73 (H) 0.61 - 1.24 mg/dL Final  27/07/5007 38:18 AM 10.84 (H) 0.61 - 1.24 mg/dL Final  29/93/7169 67:89 PM 9.92 (H) 0.61 - 1.24 mg/dL Final  38/02/1750 02:58 PM 9.05 (H) 0.61 - 1.24 mg/dL Final  52/77/8242 35:36 AM 6.19 (H) 0.61 - 1.24 mg/dL Final    Comment:    DELTA CHECK NOTED  12/08/2020 02:34 PM 3.99 (H) 0.61 - 1.24 mg/dL Final  14/43/1540 08:67 PM 10.27 (H) 0.61 - 1.24 mg/dL Final  61/95/0932  04:43 AM 6.44 (H) 0.61 - 1.24 mg/dL Final  16/02/9603 54:09 PM 5.81 (H) 0.61 - 1.24 mg/dL Final  81/19/1478 29:56 PM 5.52 (H) 0.61 - 1.24 mg/dL Final  21/30/8657 84:69 PM 9.54 (H) 0.61 - 1.24 mg/dL Final  62/95/2841 32:44 PM 10.62 (H) 0.61 - 1.24 mg/dL Final  05/15/7251 66:44 AM 10.15 (H) 0.61 - 1.24 mg/dL Final  03/47/4259 56:38 AM 11.85 (H) 0.61 - 1.24 mg/dL Final  75/64/3329 51:88 AM 8.26 (H) 0.61 - 1.24 mg/dL Final  41/66/0630 16:01 PM 6.96 (H) 0.61 - 1.24 mg/dL Final  09/32/3557 32:20 AM 9.06 (H) 0.61 - 1.24 mg/dL Final  25/42/7062 37:62 AM 6.26 (H) 0.61 - 1.24 mg/dL Final  83/15/1761 60:73 AM 8.68 (H) 0.61 - 1.24 mg/dL Final  71/10/2692 85:46  AM 11.40 (H) 0.61 - 1.24 mg/dL Final  27/07/5007 38:18 AM 8.60 (H) 0.61 - 1.24 mg/dL Final    Comment:    DELTA CHECK NOTED  09/29/2019 12:57 PM 5.54 (H) 0.61 - 1.24 mg/dL Final    Comment:    DELTA CHECK NOTED DIALYSIS   09/28/2019 06:28 AM 9.38 (H) 0.61 - 1.24 mg/dL Final  29/93/7169 67:89 AM 8.81 (H) 0.61 - 1.24 mg/dL Final  38/02/1750 02:58 PM 7.16 (H) 0.61 - 1.24 mg/dL Final  52/77/8242 35:36 AM 7.22 (H) 0.61 - 1.24 mg/dL Final  14/43/1540 08:67 PM 7.97 (H) 0.61 - 1.24 mg/dL Final  61/95/0932 67:12 AM 5.11 (H) 0.61 - 1.24 mg/dL Final  45/80/9983 38:25 AM 5.30 (H) 0.61 - 1.24 mg/dL Final    Comment:    DELTA CHECK NOTED DIALYSIS   09/03/2018 05:43 AM 8.13 (H) 0.61 - 1.24 mg/dL Final  05/39/7673 41:93 AM 11.70 (H) 0.61 - 1.24 mg/dL Final  79/06/4095 35:32 AM 9.89 (H) 0.61 - 1.24 mg/dL Final  99/24/2683 41:96 AM 8.01 (H) 0.61 - 1.24 mg/dL Final  22/29/7989 21:19 PM 7.59 (H) 0.61 - 1.24 mg/dL Final  41/74/0814 48:18 AM 10.89 (H) 0.61 - 1.24 mg/dL Final  56/31/4970 26:37 AM 11.36 (H) 0.61 - 1.24 mg/dL Final  85/88/5027 74:12 AM 8.95 (H) 0.61 - 1.24 mg/dL Final  87/86/7672 09:47 AM 10.79 (H) 0.61 - 1.24 mg/dL Final  09/62/8366 29:47 PM 8.80 (H) 0.61 - 1.24 mg/dL Final  65/46/5035 46:56 AM 7.64 (H) 0.61 - 1.24 mg/dL Final  81/27/5170 01:74 AM 10.67 (H) 0.61 - 1.24 mg/dL Final  94/49/6759 16:38 PM 7.43 (H) 0.61 - 1.24 mg/dL Final  46/65/9935 70:17 PM 4.06 (H) 0.61 - 1.24 mg/dL Final  79/39/0300 92:33 AM 6.93 (H) 0.61 - 1.24 mg/dL Final  00/76/2263 33:54 AM 4.68 (H) 0.61 - 1.24 mg/dL Final    Comment:    DELTA CHECK NOTED  08/20/2016 02:50 AM 8.41 (H) 0.61 - 1.24 mg/dL Final   Recent Labs  Lab 08/29/23 1933 08/30/23 0338  NA 132* 133*  K 4.8 4.8  CL 96* 97*  CO2 18* 17*  GLUCOSE 370* 308*  BUN 57* 64*  CREATININE 12.88* 13.34*  CALCIUM  9.0 8.7*   Recent Labs  Lab 08/29/23 1933 08/30/23 0338  WBC 7.0 7.2  HGB 12.1* 12.3*  HCT 36.6* 36.6*  MCV 101.9* 101.4*  PLT  210 213   Liver Function Tests: No results for input(s): "AST", "ALT", "ALKPHOS", "BILITOT", "PROT", "ALBUMIN " in the last 168 hours. No results for input(s): "LIPASE", "AMYLASE" in the last 168 hours. No results for input(s): "AMMONIA" in the last 168 hours. Cardiac Enzymes: No results for input(s): "CKTOTAL", "CKMB", "CKMBINDEX", "TROPONINI" in the last 168  hours. Iron  Studies: No results for input(s): "IRON ", "TIBC", "TRANSFERRIN", "FERRITIN" in the last 72 hours. PT/INR: @LABRCNTIP (inr:5)  Xrays/Other Studies: ) Results for orders placed or performed during the hospital encounter of 09/01/23 (from the past 48 hours)  Glucose, capillary     Status: Abnormal   Collection Time: 09/01/23  7:06 AM  Result Value Ref Range   Glucose-Capillary 195 (H) 70 - 99 mg/dL    Comment: Glucose reference range applies only to samples taken after fasting for at least 8 hours.   VAS US  DUPLEX DIALYSIS ACCESS (AVF, AVG) Result Date: 08/31/2023 DIALYSIS ACCESS Patient Name:  Alvin Daniels  Date of Exam:   08/30/2023 Medical Rec #: [id number]                         Accession #:    [id number] Date of Birth: 11-23-1970                          Patient Gender: M Patient Age:   53 years Exam Location:  [facility] Procedure:      VAS US  DUPLEX DIALYSIS ACCESS (AVF, AVG) Referring Phys: TIMOTHY OPYD --------------------------------------------------------------------------------  Reason for Exam: Unable to dialyze through AVF/AVG, no palpable thrill. Access Site: Left Upper Extremity. Access Type: Brachial-cephalic AVF. History: History of ligated radiocephalic fistula 82/95/62. Diabetes. HIV.          Bilateral BKA. Comparison Study: No prior study on file Performing Technologist: Carleene Chase RVS  Examination Guidelines: A complete evaluation includes B-mode imaging, spectral Doppler, color Doppler, and power Doppler as needed of all accessible portions of each vessel. Unilateral testing is  considered an integral part of a complete examination. Limited examinations for reoccurring indications may be performed as noted.  Findings:  +------------+----------+-------------+----------+--------+ OUTFLOW VEINPSV (cm/s)Diameter (cm)Depth (cm)Describe +------------+----------+-------------+----------+--------+ Shoulder                                     occluded +------------+----------+-------------+----------+--------+ Prox UA                                      occluded +------------+----------+-------------+----------+--------+ Mid UA                                       occluded +------------+----------+-------------+----------+--------+ Dist UA                                      occluded +------------+----------+-------------+----------+--------+ AC Fossa                                     occluded +------------+----------+-------------+----------+--------+   Summary: Arteriovenous fistula-Thrombus noted. *See table(s) above for measurements and observations.  Diagnosing physician: Runell Countryman Electronically signed by Runell Countryman on 08/31/2023 at 3:46:59 PM.    --------------------------------------------------------------------------------   Final     PMH:   Past Medical History:  Diagnosis Date   Acute bilateral low back pain 01/03/2021   AIDS ([name]) 11/22/2014   Back pain 06/09/2019   BKA stump complication ([name]) 06/09/2019  Chronic diarrhea    Chronic hepatitis C without hepatic coma ([name]) 11/22/2014   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[name])    Right   Corneal ulcer 12/18/2022   Descemetocele of left eye 12/18/2022   Diabetic neuropathy ([name])    ESRD (end stage renal disease) on dialysis ([name])    "TTS; don't remember street name" (05/03/2014)   Hepatitis C    HIV INFECTION 06/27/2010   Qualifier: Diagnosis of  By: Oretha Birch CMA ( AAMA), Jacqueline     Hypotension 06/02/2012   Metabolic bone disease 06/18/2012   MRSA infection     Normocytic anemia 06/17/2012   Pancreatitis    Pressure ulcer of BKA stump, stage 2 ([name]) 11/22/2014   Renal disorder    Renal insufficiency    Severe protein-calorie malnutrition ([name]) 06/17/2012   Uncontrolled diabetes mellitus with complications 06/27/2010   Annotation: uncontrolled Qualifier: Diagnosis of  By: Oretha Birch CMA ( AAMA), Jacqueline     Vaccine counseling 05/29/2022    PSH:   Past Surgical History:  Procedure Laterality Date   AMPUTATION Left 04/20/2014   Procedure: 3rd toe amputation, 4th Toe Amputation,  5th Toe Amputation;  Surgeon: [name], MD;  Location: [facility] OR;  Service: Orthopedics;  Laterality: Left;   AMPUTATION Left 05/02/2014   Procedure: Midfoot Amputation;  Surgeon: [name], MD;  Location: [facility] OR;  Service: Orthopedics;  Laterality: Left;   AMPUTATION Left 06/17/2014   Procedure: AMPUTATION BELOW KNEE;  Surgeon: [name], MD;  Location: [facility] OR;  Service: Orthopedics;  Laterality: Left;   AMPUTATION Right 09/04/2018   Procedure: RIGHT BELOW KNEE AMPUTATION;  Surgeon: [name], MD;  Location: [facility] OR;  Service: Orthopedics;  Laterality: Right;  RIGHT BELOW KNEE AMPUTATION   AV FISTULA PLACEMENT Left    AV FISTULA PLACEMENT Left 05/10/2016   Procedure: Creation Left Arm Brachiocephalic Arteriovenous Fistula and Ligation of Radiocephalic Fistula;  Surgeon: [name], MD;  Location: [facility] OR;  Service: Vascular;  Laterality: Left;   FEMUR IM NAIL Left 08/17/2016   Procedure: INTRAMEDULLARY (IM) RETROGRADE FEMORAL NAILING;  Surgeon: Adah Acron, MD;  Location: [facility] OR;  Service: Orthopedics;  Laterality: Left;   FOOT AMPUTATION THROUGH ANKLE Left 12/'21/2015   midfoot   IR AV DIALY SHUNT INTRO NEEDLE/INTRACATH INITIAL W/PTA/IMG LEFT  04/17/2023   IR GENERIC HISTORICAL Left 05/01/2016   IR THROMBECTOMY AV FISTULA W/THROMBOLYSIS/PTA INC/SHUNT/IMG LEFT 05/01/2016 [name], MD [facility]   IR GENERIC HISTORICAL  05/01/2016   IR US  GUIDE  VASC ACCESS LEFT 05/01/2016 [name], MD [facility]   IR GENERIC HISTORICAL  05/07/2016   IR FLUORO GUIDE CV LINE RIGHT 05/07/2016 [name], DO [facility]   IR GENERIC HISTORICAL  05/07/2016   IR US  GUIDE VASC ACCESS RIGHT 05/07/2016 [name], DO [facility]   IR GENERIC HISTORICAL  05/22/2016   IR US  GUIDE VASC ACCESS RIGHT 05/22/2016 [name], MD [facility]   IR GENERIC HISTORICAL  05/22/2016   IR FLUORO GUIDE CV LINE RIGHT 05/22/2016 [name], MD [facility]   IR REMOVAL TUN CV CATH W/O [facility]  08/21/2016   IR US  GUIDE VASC ACCESS LEFT  04/17/2023   PERIPHERAL VASCULAR CATHETERIZATION Left 05/09/2016   Procedure: A/V Fistulagram;  Surgeon: [name], MD;  Location: [facility] INVASIVE CV LAB;  Service: Cardiovascular;  Laterality: Left;  arm   TEE WITHOUT CARDIOVERSION N/A 06/22/2018   Procedure: TRANSESOPHAGEAL ECHOCARDIOGRAM (TEE);  Surgeon: [name], MD;  Location: [facility] ENDOSCOPY;  Service: Cardiovascular;  Laterality: N/A;    Allergies: No Known Allergies  Medications:   Prior to Admission medications   Medication Sig Start Date End Date Taking? Authorizing Provider  bictegravir-emtricitabine -tenofovir  AF (BIKTARVY ) 50-200-25 MG TABS tablet Take 1 tablet by mouth daily. 04/16/23   [name], MD  insulin  aspart protamine  - aspart (NOVOLOG  70/30 FLEXPEN) (70-30) 100 UNIT/ML FlexPen Inject 20 Units into the skin 2 (two) times daily. Patient taking differently: Inject 15 Units into the skin 2 (two) times daily. 07/17/23   [name], MD  bictegravir-emtricitabine -tenofovir  AF (BIKTARVY ) 50-200-25 MG TABS tablet Take 1 tablet by mouth daily. 01/02/22   [name], MD    Discontinued Meds:  There are no discontinued medications.  Social History:  reports that he has never smoked. He has never used smokeless tobacco. He reports that he does not drink alcohol  and does not use drugs.  Family History:   Family History  Problem  Relation Age of Onset   Diabetes Mother    Diabetes Father     Blood pressure (!) 174/93, pulse 88, temperature 97.6 F (36.4 C), resp. rate 12, SpO2 100%. GEN: NAD, A&Ox3, NCAT HEENT: No conjunctival pallor, EOMI NECK: Supple, no thyromegaly LUNGS: CTA B/L no rales, rhonchi or wheezing CV: RRR, No M/R/G ABD: SNDNT +BS  EXT: BKA ACCESS: lt BCF large aneurysms (2) loaded with thrombus, no bruit        Ambrea Hegler, Alveda Aures, MD 09/01/2023, 7:35 AM

## 2023-09-01 NOTE — Discharge Instructions (Signed)

## 2023-09-02 ENCOUNTER — Telehealth: Payer: Self-pay

## 2023-09-02 NOTE — Transitions of Care (Post Inpatient/ED Visit) (Signed)
   09/02/2023  Name: Alvin Daniels MRN: [id number] DOB: 01-10-1971  Today's TOC FU Call Status: Today's TOC FU Call Status:: Unsuccessful Call (1st Attempt) Unsuccessful Call (1st Attempt) Date: 09/02/23  Attempted to reach the patient regarding the most recent Inpatient/ED visit.  Follow Up Plan: Additional outreach attempts will be made to reach the patient to complete the Transitions of Care (Post Inpatient/ED visit) call.   Signature  Burnett Carson, RN

## 2023-09-03 ENCOUNTER — Telehealth: Payer: Self-pay

## 2023-09-03 NOTE — Transitions of Care (Post Inpatient/ED Visit) (Signed)
 09/03/2023  Name: Alvin Daniels MRN: [id number] DOB: 07/22/1970  Today's TOC FU Call Status: Today's TOC FU Call Status:: Successful TOC FU Call Completed Unsuccessful Call (1st Attempt) Date: 09/02/23 [facility] FU Call Complete Date: 09/03/23 Patient's Name and Date of Birth confirmed.  Transition Care Management Follow-up Telephone Call Date of Discharge: 08/30/23 Discharge Facility: [facility]) Type of Discharge: Inpatient Admission Primary Inpatient Discharge Diagnosis:: problem with dialysis access.  returned to hospital 09/01/2023 for dialysis permacath insertion How have you been since you were released from the hospital?: Better Any questions or concerns?: Yes Patient Questions/Concerns:: His concerns were regarding the dialysis center. He said that they have " changed rules" regarding arrival times.  He also spoke about a staff member that his has problems with.  I instructed him to discuss these concerns with the dialysis center staff and/or his nephrologist.  per Epic , he is uninsured. He told me he has insurance for dialysis.  I told him that I will ask our financial counselor to contact him about his insurance situation as he may need to apply for CAFA/ [place] if he does not qualify for Medicaid Patient Questions/Concerns Addressed: Other: (noted above- patient will need to speak to dialysis center staff and/or nephrologist.  I sent message to Josselin Speas, financial counselor requesting she contact the patient.)  Items Reviewed: Did you receive and understand the discharge instructions provided?: Yes Medications obtained,verified, and reconciled?: Yes (Medications Reviewed) (He has both medications and did not have any questions about the meds. He also said he has a glucometer.) Any new allergies since your discharge?: No Dietary orders reviewed?: Yes Type of Diet Ordered:: Renal/carb modified with 1200 ml/day fluid restriction.  - He said he knows what he is  supposed to eat. Do you have support at home?: Yes People in Home [RPT]: alone Name of Support/Comfort Primary Source: He said he has help if needed  Medications Reviewed Today: Medications Reviewed Today     Reviewed by Burnett Carson, RN (Case Manager) on 09/03/23 at 1104  Med List Status: <None>   Medication Order Taking? Sig Documenting Provider Last Dose Status Informant  Discontinued 05/29/22 1351 (Reorder)   bictegravir-emtricitabine -tenofovir  AF (BIKTARVY ) 50-200-25 MG TABS tablet [id number] No Take 1 tablet by mouth daily. Ernie Heal, Jerelyn Money, MD 08/29/2023 Morning Active Self, Pharmacy Records  insulin  aspart protamine  - aspart (NOVOLOG  70/30 FLEXPEN) (70-30) 100 UNIT/ML FlexPen [id number] No Inject 20 Units into the skin 2 (two) times daily.  Patient taking differently: Inject 15 Units into the skin 2 (two) times daily.   [name], MD 08/29/2023 Morning Active Self, Pharmacy Records  Med List Note Tonja Fray 08/30/23 [id number]): Dialysis Mon-Weds-Fri; [facility].             Home Care and Equipment/Supplies: Were Home Health Services Ordered?: No Any new equipment or medical supplies ordered?: No  Functional Questionnaire: Do you need assistance with bathing/showering or dressing?: No Do you need assistance with meal preparation?: No Do you need assistance with eating?: No Do you have difficulty maintaining continence: No Do you need assistance with getting out of bed/getting out of a chair/moving?: Yes (has BLE prosthetics and also has a walker.) Do you have difficulty managing or taking your medications?: No  Follow up appointments reviewed: PCP Follow-up appointment confirmed?: Yes Date of PCP follow-up appointment?: 01/14/24 (He did not want to schedule an appointment to be seen sooner) Follow-up Provider: Dr. Elvan Hamel [facility] Follow-up appointment confirmed?:  Yes Date of Specialist follow-up appointment?:  10/15/23 Follow-Up Specialty Provider:: [facility].       Attends dialysis MWF Do you need transportation to your follow-up appointment?: No Do you understand care options if your condition(s) worsen?: Yes-patient verbalized understanding    SIGNATURE Burnett Carson, RN

## 2023-09-04 NOTE — Telephone Encounter (Signed)
 Spoke to pt, explained to apply for medicaid first and after he receives denial he can apply for charity program. He was referred to DSS @ [facility] to apply for medicaid and after he receives a denial to speak with Constantino Demark for him to apply for charity care.

## 2023-09-05 ENCOUNTER — Other Ambulatory Visit: Payer: Self-pay

## 2023-09-05 MED ORDER — MIDODRINE HCL 10 MG PO TABS
10.0000 mg | ORAL_TABLET | Freq: Three times a day (TID) | ORAL | 3 refills | Status: DC | PRN
  Filled 2023-09-05: qty 270, 90d supply, fill #0
  Filled 2023-11-19: qty 270, 90d supply, fill #1

## 2023-09-15 ENCOUNTER — Other Ambulatory Visit: Payer: Self-pay

## 2023-10-01 NOTE — Progress Notes (Signed)
 The 10-year ASCVD risk score (Arnett DK, et al., 2019) is: 10%   Values used to calculate the score:     Age: 53 years     Sex: Male     Is Non-Hispanic African American: No     Diabetic: Yes     Tobacco smoker: No     Systolic Blood Pressure: 148 mmHg     Is BP treated: No     HDL Cholesterol: 40 mg/dL     Total Cholesterol: 166 mg/dL  No current statin therapy, next appointment note updated.   Alysiah Suppa, BSN, RN

## 2023-10-08 ENCOUNTER — Emergency Department (HOSPITAL_COMMUNITY): Payer: Self-pay

## 2023-10-08 ENCOUNTER — Other Ambulatory Visit: Payer: Self-pay

## 2023-10-08 ENCOUNTER — Inpatient Hospital Stay (HOSPITAL_COMMUNITY)
Admission: EM | Admit: 2023-10-08 | Discharge: 2023-10-18 | DRG: 255 | Disposition: A | Discharge status: Home / Self Care | Payer: Self-pay | Attending: Internal Medicine | Admitting: Internal Medicine

## 2023-10-08 DIAGNOSIS — M545 Low back pain, unspecified: Secondary | ICD-10-CM | POA: Diagnosis present

## 2023-10-08 DIAGNOSIS — R6520 Severe sepsis without septic shock: Secondary | ICD-10-CM | POA: Diagnosis present

## 2023-10-08 DIAGNOSIS — Z21 Asymptomatic human immunodeficiency virus [HIV] infection status: Secondary | ICD-10-CM | POA: Diagnosis present

## 2023-10-08 DIAGNOSIS — Z79899 Other long term (current) drug therapy: Secondary | ICD-10-CM

## 2023-10-08 DIAGNOSIS — E872 Acidosis, unspecified: Secondary | ICD-10-CM | POA: Diagnosis present

## 2023-10-08 DIAGNOSIS — Y838 Other surgical procedures as the cause of abnormal reaction of the patient, or of later complication, without mention of misadventure at the time of the procedure: Secondary | ICD-10-CM | POA: Diagnosis present

## 2023-10-08 DIAGNOSIS — E871 Hypo-osmolality and hyponatremia: Secondary | ICD-10-CM | POA: Diagnosis present

## 2023-10-08 DIAGNOSIS — R748 Abnormal levels of other serum enzymes: Secondary | ICD-10-CM | POA: Diagnosis present

## 2023-10-08 DIAGNOSIS — A4102 Sepsis due to Methicillin resistant Staphylococcus aureus: Secondary | ICD-10-CM | POA: Diagnosis present

## 2023-10-08 DIAGNOSIS — Z833 Family history of diabetes mellitus: Secondary | ICD-10-CM

## 2023-10-08 DIAGNOSIS — A419 Sepsis, unspecified organism: Secondary | ICD-10-CM | POA: Diagnosis present

## 2023-10-08 DIAGNOSIS — E1152 Type 2 diabetes mellitus with diabetic peripheral angiopathy with gangrene: Secondary | ICD-10-CM | POA: Diagnosis present

## 2023-10-08 DIAGNOSIS — I330 Acute and subacute infective endocarditis: Secondary | ICD-10-CM

## 2023-10-08 DIAGNOSIS — E1140 Type 2 diabetes mellitus with diabetic neuropathy, unspecified: Secondary | ICD-10-CM | POA: Diagnosis present

## 2023-10-08 DIAGNOSIS — I472 Ventricular tachycardia, unspecified: Secondary | ICD-10-CM | POA: Diagnosis present

## 2023-10-08 DIAGNOSIS — E1169 Type 2 diabetes mellitus with other specified complication: Secondary | ICD-10-CM | POA: Diagnosis present

## 2023-10-08 DIAGNOSIS — Z992 Dependence on renal dialysis: Secondary | ICD-10-CM

## 2023-10-08 DIAGNOSIS — E1165 Type 2 diabetes mellitus with hyperglycemia: Secondary | ICD-10-CM | POA: Diagnosis present

## 2023-10-08 DIAGNOSIS — N2581 Secondary hyperparathyroidism of renal origin: Secondary | ICD-10-CM | POA: Diagnosis present

## 2023-10-08 DIAGNOSIS — Z603 Acculturation difficulty: Secondary | ICD-10-CM | POA: Diagnosis present

## 2023-10-08 DIAGNOSIS — B1920 Unspecified viral hepatitis C without hepatic coma: Secondary | ICD-10-CM | POA: Diagnosis present

## 2023-10-08 DIAGNOSIS — D631 Anemia in chronic kidney disease: Secondary | ICD-10-CM | POA: Diagnosis present

## 2023-10-08 DIAGNOSIS — D696 Thrombocytopenia, unspecified: Secondary | ICD-10-CM | POA: Diagnosis present

## 2023-10-08 DIAGNOSIS — B954 Other streptococcus as the cause of diseases classified elsewhere: Secondary | ICD-10-CM | POA: Diagnosis present

## 2023-10-08 DIAGNOSIS — M25551 Pain in right hip: Secondary | ICD-10-CM | POA: Diagnosis present

## 2023-10-08 DIAGNOSIS — M869 Osteomyelitis, unspecified: Principal | ICD-10-CM

## 2023-10-08 DIAGNOSIS — E876 Hypokalemia: Secondary | ICD-10-CM | POA: Diagnosis present

## 2023-10-08 DIAGNOSIS — T80211A Bloodstream infection due to central venous catheter, initial encounter: Principal | ICD-10-CM | POA: Diagnosis present

## 2023-10-08 DIAGNOSIS — I9589 Other hypotension: Secondary | ICD-10-CM | POA: Diagnosis present

## 2023-10-08 DIAGNOSIS — K821 Hydrops of gallbladder: Secondary | ICD-10-CM | POA: Diagnosis present

## 2023-10-08 DIAGNOSIS — Z89511 Acquired absence of right leg below knee: Secondary | ICD-10-CM

## 2023-10-08 DIAGNOSIS — K838 Other specified diseases of biliary tract: Secondary | ICD-10-CM

## 2023-10-08 DIAGNOSIS — Z89512 Acquired absence of left leg below knee: Secondary | ICD-10-CM

## 2023-10-08 DIAGNOSIS — N186 End stage renal disease: Secondary | ICD-10-CM | POA: Diagnosis present

## 2023-10-08 DIAGNOSIS — M25512 Pain in left shoulder: Secondary | ICD-10-CM | POA: Diagnosis not present

## 2023-10-08 DIAGNOSIS — B182 Chronic viral hepatitis C: Secondary | ICD-10-CM | POA: Diagnosis present

## 2023-10-08 DIAGNOSIS — Z794 Long term (current) use of insulin: Secondary | ICD-10-CM

## 2023-10-08 DIAGNOSIS — E1122 Type 2 diabetes mellitus with diabetic chronic kidney disease: Secondary | ICD-10-CM | POA: Diagnosis present

## 2023-10-08 DIAGNOSIS — B9620 Unspecified Escherichia coli [E. coli] as the cause of diseases classified elsewhere: Secondary | ICD-10-CM | POA: Diagnosis present

## 2023-10-08 DIAGNOSIS — E1142 Type 2 diabetes mellitus with diabetic polyneuropathy: Secondary | ICD-10-CM | POA: Diagnosis present

## 2023-10-08 LAB — CBC WITH DIFFERENTIAL/PLATELET
Abs Immature Granulocytes: 0.1 10*3/uL — ABNORMAL HIGH (ref 0.00–0.07)
Basophils Absolute: 0.1 10*3/uL (ref 0.0–0.1)
Basophils Relative: 0 %
Eosinophils Absolute: 0.1 10*3/uL (ref 0.0–0.5)
Eosinophils Relative: 1 %
HCT: 31.2 % — ABNORMAL LOW (ref 39.0–52.0)
Hemoglobin: 10.6 g/dL — ABNORMAL LOW (ref 13.0–17.0)
Immature Granulocytes: 1 %
Lymphocytes Relative: 6 %
Lymphs Abs: 0.8 10*3/uL (ref 0.7–4.0)
MCH: 33 pg (ref 26.0–34.0)
MCHC: 34 g/dL (ref 30.0–36.0)
MCV: 97.2 fL (ref 80.0–100.0)
Monocytes Absolute: 0.6 10*3/uL (ref 0.1–1.0)
Monocytes Relative: 5 %
Neutro Abs: 10.7 10*3/uL — ABNORMAL HIGH (ref 1.7–7.7)
Neutrophils Relative %: 87 %
Platelets: 87 10*3/uL — ABNORMAL LOW (ref 150–400)
RBC: 3.21 MIL/uL — ABNORMAL LOW (ref 4.22–5.81)
RDW: 13.8 % (ref 11.5–15.5)
Smear Review: NORMAL
WBC: 12.2 10*3/uL — ABNORMAL HIGH (ref 4.0–10.5)
nRBC: 0 % (ref 0.0–0.2)

## 2023-10-08 LAB — COMPREHENSIVE METABOLIC PANEL WITH GFR
ALT: 34 U/L (ref 0–44)
AST: 52 U/L — ABNORMAL HIGH (ref 15–41)
Albumin: 2.7 g/dL — ABNORMAL LOW (ref 3.5–5.0)
Alkaline Phosphatase: 261 U/L — ABNORMAL HIGH (ref 38–126)
Anion gap: 15 (ref 5–15)
BUN: 28 mg/dL — ABNORMAL HIGH (ref 6–20)
CO2: 26 mmol/L (ref 22–32)
Calcium: 8.9 mg/dL (ref 8.9–10.3)
Chloride: 95 mmol/L — ABNORMAL LOW (ref 98–111)
Creatinine, Ser: 6.93 mg/dL — ABNORMAL HIGH (ref 0.61–1.24)
GFR, Estimated: 9 mL/min — ABNORMAL LOW (ref >60)
Glucose, Bld: 79 mg/dL (ref 70–99)
Potassium: 3.1 mmol/L — ABNORMAL LOW (ref 3.5–5.1)
Sodium: 136 mmol/L (ref 135–145)
Total Bilirubin: 1.4 mg/dL — ABNORMAL HIGH (ref 0.0–1.2)
Total Protein: 7.9 g/dL (ref 6.5–8.1)

## 2023-10-08 LAB — LACTIC ACID, PLASMA: Lactic Acid, Venous: 1.3 mmol/L (ref 0.5–1.9)

## 2023-10-08 LAB — MAGNESIUM: Magnesium: 2.1 mg/dL (ref 1.7–2.4)

## 2023-10-08 LAB — APTT: aPTT: 39 s — ABNORMAL HIGH (ref 24–36)

## 2023-10-08 LAB — TROPONIN I (HIGH SENSITIVITY)
Troponin I (High Sensitivity): 53 ng/L — ABNORMAL HIGH (ref <18)
Troponin I (High Sensitivity): 54 ng/L — ABNORMAL HIGH (ref <18)

## 2023-10-08 LAB — PROTIME-INR
INR: 1.2 (ref 0.8–1.2)
Prothrombin Time: 15 s (ref 11.4–15.2)

## 2023-10-08 LAB — LIPASE, BLOOD: Lipase: 32 U/L (ref 11–51)

## 2023-10-08 LAB — I-STAT CG4 LACTIC ACID, ED: Lactic Acid, Venous: 0.9 mmol/L (ref 0.5–1.9)

## 2023-10-08 MED ORDER — ONDANSETRON HCL 4 MG PO TABS
4.0000 mg | ORAL_TABLET | Freq: Four times a day (QID) | ORAL | Status: DC | PRN
  Administered 2023-10-12: 4 mg via ORAL
  Filled 2023-10-08: qty 1

## 2023-10-08 MED ORDER — HEPARIN SODIUM (PORCINE) 5000 UNIT/ML IJ SOLN
5000.0000 [IU] | Freq: Three times a day (TID) | INTRAMUSCULAR | Status: DC
  Administered 2023-10-09 – 2023-10-18 (×25): 5000 [IU] via SUBCUTANEOUS
  Filled 2023-10-08 (×24): qty 1

## 2023-10-08 MED ORDER — SODIUM CHLORIDE 0.9 % IV BOLUS
1000.0000 mL | Freq: Once | INTRAVENOUS | Status: AC
  Administered 2023-10-08: 1000 mL via INTRAVENOUS

## 2023-10-08 MED ORDER — LACTATED RINGERS IV BOLUS: 1000.0000 mL | Freq: Once | INTRAVENOUS | Status: DC

## 2023-10-08 MED ORDER — PIPERACILLIN-TAZOBACTAM IN DEX 2-0.25 GM/50ML IV SOLN
2.2500 g | Freq: Once | INTRAVENOUS | Status: AC
  Administered 2023-10-08: 2.25 g via INTRAVENOUS
  Filled 2023-10-08: qty 50

## 2023-10-08 MED ORDER — SENNOSIDES-DOCUSATE SODIUM 8.6-50 MG PO TABS: 1.0000 | ORAL_TABLET | Freq: Every evening | ORAL | Status: DC | PRN

## 2023-10-08 MED ORDER — ONDANSETRON HCL 4 MG/2ML IJ SOLN
4.0000 mg | Freq: Four times a day (QID) | INTRAMUSCULAR | Status: DC | PRN
  Administered 2023-10-11 – 2023-10-12 (×2): 4 mg via INTRAVENOUS
  Filled 2023-10-08 (×2): qty 2

## 2023-10-08 MED ORDER — VANCOMYCIN HCL IN DEXTROSE 1-5 GM/200ML-% IV SOLN
1000.0000 mg | Freq: Once | INTRAVENOUS | Status: AC
  Administered 2023-10-08: 1000 mg via INTRAVENOUS
  Filled 2023-10-08: qty 200

## 2023-10-08 MED ORDER — AMIODARONE IV BOLUS ONLY 150 MG/100ML
150.0000 mg | Freq: Once | INTRAVENOUS | Status: DC
  Filled 2023-10-08: qty 100

## 2023-10-08 MED ORDER — INSULIN ASPART 100 UNIT/ML IJ SOLN
0.0000 [IU] | INTRAMUSCULAR | Status: DC
  Administered 2023-10-09: 1 [IU] via SUBCUTANEOUS
  Administered 2023-10-10: 3 [IU] via SUBCUTANEOUS
  Administered 2023-10-10: 2 [IU] via SUBCUTANEOUS
  Administered 2023-10-11 (×2): 1 [IU] via SUBCUTANEOUS

## 2023-10-08 MED ORDER — ACETAMINOPHEN 650 MG RE SUPP: 650.0000 mg | Freq: Four times a day (QID) | RECTAL | Status: DC | PRN

## 2023-10-08 MED ORDER — ACETAMINOPHEN 325 MG PO TABS
650.0000 mg | ORAL_TABLET | Freq: Four times a day (QID) | ORAL | Status: DC | PRN
  Administered 2023-10-11 – 2023-10-13 (×2): 650 mg via ORAL
  Filled 2023-10-08 (×2): qty 2

## 2023-10-08 NOTE — ED Triage Notes (Signed)
 Pt having NVD for 1 week. Has dialysis MWF and has not missed dialysis. Pain is LLQ and RLQ feels like cramping.

## 2023-10-08 NOTE — Progress Notes (Signed)
 ED Pharmacy Antibiotic Sign Off An antibiotic consult was received from an ED provider for vancomycin  and zosyn  per pharmacy dosing for wound infection. A chart review was completed to assess appropriateness.  The following one time order(s) were placed per pharmacy consult:  zosyn  2.25g x 1 dose vancomycin  1000 mg x 1 dose  Further antibiotic and/or antibiotic pharmacy consults should be ordered by the admitting provider if indicated.   Thank you for allowing pharmacy to be a part of this patient's care.   Dionicio Fray, PharmD, BCPS 10/08/2023 7:59 PM ED Clinical Pharmacist -  [phone]

## 2023-10-08 NOTE — ED Notes (Signed)
 PA Bauer notified and aware of low patient BP.  New order placed.

## 2023-10-08 NOTE — ED Provider Triage Note (Signed)
 Emergency Medicine Provider Triage Evaluation Note  Alvin Daniels , a 53 y.o. male  was evaluated in triage.  Pt complains of RLQ abd pain and R flank pain X 1 week.  Review of Systems  Positive: R flank pain Negative: N/v  Physical Exam  BP (!) 78/52 (BP Location: Right Arm)   Pulse 80   Temp 98 F (36.7 C)   Resp 17   Ht 4\' 11"  (1.499 m)   Wt 51 kg   SpO2 96%   BMI 22.71 kg/m  Gen:   Awake, no distress   Resp:  Normal effort  MSK:   Moves extremities without difficulty  Other:  RLQ tenderness, no gaurding  Medical Decision Making  Medically screening exam initiated at 4:32 PM.  Appropriate orders placed.  Bosco Hernandez-Hernandez was informed that the remainder of the evaluation will be completed by another provider, this initial triage assessment does not replace that evaluation, and the importance of remaining in the ED until their evaluation is complete.     Aimee Houseman, [place] 10/08/23 [phone]

## 2023-10-08 NOTE — ED Provider Notes (Signed)
 [place] EMERGENCY DEPARTMENT AT [facility] Provider Note   CSN: [id number] Arrival date & time: 10/08/23  1615     History  Chief Complaint  Patient presents with   Abdominal Pain    Alvin Daniels is a 53 y.o. male.  The history is provided by the patient. The history is limited by a language barrier. A language interpreter was used.  Abdominal Pain Patient is a 53 year old Spanish-speaking male reporting to the ED for nausea/vomiting/diarrhea x 1 week, noting improvement but 2 days ago noticed lower abdominal pain that has been progressively worse, described as "cramping"..  Medical history of hepatitis C, MRSA, HIV, hemodialysis patient, Monday Wednesday Friday and has not missed any treatments at this time.  Continued diarrhea however vomiting has stopped and he is currently tolerating p.o. at this time.  Notes that he also has had a wound on his left ring finger x 1 month after "starting dialysis."  Says that it has become increasingly more painful and is now having decreased sensation over the finger.  Reported a fever on the first day of his nausea/vomiting/diarrhea but has not had any since.   Denies headache, vision changes, chest pain, shortness breath, hematochezia, melena.       Home Medications Prior to Admission medications   Medication Sig Start Date End Date Taking? Authorizing Provider  bictegravir-emtricitabine -tenofovir  AF (BIKTARVY ) 50-200-25 MG TABS tablet Take 1 tablet by mouth daily. 04/16/23   [name], MD  insulin  aspart protamine  - aspart (NOVOLOG  70/30 FLEXPEN) (70-30) 100 UNIT/ML FlexPen Inject 20 Units into the skin 2 (two) times daily. Patient taking differently: Inject 15 Units into the skin 2 (two) times daily. 07/17/23   [name], MD  midodrine  (PROAMATINE ) 10 MG tablet Take 1 tablet (10 mg total) by mouth 3 (three) times daily as needed for low blood pressure, SBP</=100. may take 30 min prior to dialysis  and mid treatment as needed. 09/05/23     bictegravir-emtricitabine -tenofovir  AF (BIKTARVY ) 50-200-25 MG TABS tablet Take 1 tablet by mouth daily. 01/02/22   [name], MD      Allergies    Patient has no known allergies.    Review of Systems   Review of Systems  Gastrointestinal:  Positive for abdominal pain.  Musculoskeletal:  Positive for arthralgias and myalgias.  All other systems reviewed and are negative.   Physical Exam Updated Vital Signs BP (!) 85/55   Pulse 85   Temp 99.5 F (37.5 C) (Oral)   Resp 17   Ht 4\' 11"  (1.499 m)   Wt 51 kg   SpO2 92%   BMI 22.71 kg/m  Physical Exam Vitals and nursing note reviewed.  Constitutional:      General: He is not in acute distress.    Appearance: Normal appearance. He is not ill-appearing or diaphoretic.  HENT:     Head: Normocephalic and atraumatic.  Eyes:     General:        Right eye: No discharge.        Left eye: No discharge.     Extraocular Movements: Extraocular movements intact.     Conjunctiva/sclera: Conjunctivae normal.  Cardiovascular:     Rate and Rhythm: Normal rate and regular rhythm.     Pulses: Normal pulses.     Heart sounds: Normal heart sounds. No murmur heard.    No friction rub. No gallop.  Pulmonary:     Effort: Pulmonary effort is normal. No respiratory distress.  Breath sounds: Normal breath sounds. No stridor. No wheezing, rhonchi or rales.  Chest:     Chest wall: No tenderness.  Abdominal:     General: Abdomen is flat.     Palpations: Abdomen is soft.     Tenderness: There is abdominal tenderness in the right lower quadrant and left lower quadrant. There is no right CVA tenderness, left CVA tenderness, guarding or rebound. Negative signs include Murphy's sign, Rovsing's sign and McBurney's sign.  Musculoskeletal:     Cervical back: Normal range of motion. No rigidity or tenderness.     Comments: Status post bilateral BKA  Skin:    General: Skin is warm and dry.      Findings: Erythema present.     Comments: Foul-smelling, skin lesion noted to the left middle finger with pictures noted in chart  Neurological:     General: No focal deficit present.     Mental Status: He is alert and oriented to person, place, and time. Mental status is at baseline.     Cranial Nerves: No cranial nerve deficit.  Psychiatric:        Mood and Affect: Mood normal.     ED Results / Procedures / Treatments   Labs (all labs ordered are listed, but only abnormal results are displayed) Labs Reviewed  CBC WITH DIFFERENTIAL/PLATELET - Abnormal; Notable for the following components:      Result Value   WBC 12.2 (*)    RBC 3.21 (*)    Hemoglobin 10.6 (*)    HCT 31.2 (*)    Platelets 87 (*)    Neutro Abs 10.7 (*)    Abs Immature Granulocytes 0.10 (*)    All other components within normal limits  COMPREHENSIVE METABOLIC PANEL WITH GFR - Abnormal; Notable for the following components:   Potassium 3.1 (*)    Chloride 95 (*)    BUN 28 (*)    Creatinine, Ser 6.93 (*)    Albumin  2.7 (*)    AST 52 (*)    Alkaline Phosphatase 261 (*)    Total Bilirubin 1.4 (*)    GFR, Estimated 9 (*)    All other components within normal limits  APTT - Abnormal; Notable for the following components:   aPTT 39 (*)    All other components within normal limits  TROPONIN I (HIGH SENSITIVITY) - Abnormal; Notable for the following components:   Troponin I (High Sensitivity) 53 (*)    All other components within normal limits  CULTURE, BLOOD (ROUTINE X 2)  CULTURE, BLOOD (ROUTINE X 2)  LIPASE, BLOOD  LACTIC ACID, PLASMA  MAGNESIUM   PROTIME-INR  URINALYSIS, ROUTINE W REFLEX MICROSCOPIC  I-STAT CG4 LACTIC ACID, ED  TROPONIN I (HIGH SENSITIVITY)    EKG None  Radiology US  Abdomen Limited RUQ (LIVER/GB) Result Date: 10/08/2023 CLINICAL DATA:  Epigastric pain. EXAM: ULTRASOUND ABDOMEN LIMITED RIGHT UPPER QUADRANT COMPARISON:  10/08/2023, 09/28/2019 at. FINDINGS: Gallbladder: Echogenic  material is present in the dependent portion of the gallbladder. There is mild gallbladder wall thickening at 4.6 mm. No sonographic Murphy sign noted by sonographer. Common bile duct: Diameter: 14.8 mm Liver: No focal lesion identified. Within normal limits in parenchymal echogenicity. Portal vein is patent on color Doppler imaging with normal direction of blood flow towards the liver. Other: Shadowing calcifications in the right kidney are noted, compatible with no vascular calcifications. Increased renal parenchymal echogenicity is noted. IMPRESSION: 1. Echogenic material in the gallbladder, possible sludge with mild gallbladder wall thickening. No sonographic Abigail Abler  sign is see to suggest acute cholecystitis. 2. Distended common bile duct measuring 14.8 mm, minimally increased from 2021 and may be chronic. 3. Increased renal echogenicity on the right, suggesting medical renal disease. Electronically Signed   By: Wyvonnia Heimlich M.D.   On: 10/08/2023 20:41   CT ABDOMEN PELVIS WO CONTRAST Result Date: 10/08/2023 CLINICAL DATA:  LLQ abdominal pain RLQ abdominal pain EXAM: CT ABDOMEN AND PELVIS WITHOUT CONTRAST TECHNIQUE: Multidetector CT imaging of the abdomen and pelvis was performed following the standard protocol without IV contrast. RADIATION DOSE REDUCTION: This exam was performed according to the departmental dose-optimization program which includes automated exposure control, adjustment of the mA and/or kV according to patient size and/or use of iterative reconstruction technique. COMPARISON:  CT abdomen pelvis 09/27/2019, ultrasound abdomen 09/28/2019 FINDINGS: Lower chest: No acute abnormality. Similar-appearing bilateral lower lobe atelectasis versus scarring. Hepatobiliary: Liver is enlarged measuring up to 20 cm. No focal liver abnormality. No gallstones, gallbladder wall thickening, or pericholecystic fluid. Common bile duct dilatation measuring up to 14 mm. Hydropic gallbladder. No definite  intrahepatic biliary ductal dilatation. Pancreas: No focal lesion. Normal pancreatic contour. No surrounding inflammatory changes. No main pancreatic ductal dilatation. Spleen: Normal in size without focal abnormality. Adrenals/Urinary Tract: No adrenal nodule bilaterally. No nephrolithiasis and no hydronephrosis. No definite contour-deforming renal mass. No ureterolithiasis or hydroureter. The urinary bladder is unremarkable. Stomach/Bowel: Stomach is within normal limits. No evidence of bowel wall thickening or dilatation. Prominent rectosigmoid wall likely due to under distension. Under distended bowel. Appendix appears normal. Vascular/Lymphatic: No abdominal aorta or iliac aneurysm. Moderate to severe atherosclerotic plaque of the aorta and its branches. No abdominal, pelvic, or inguinal lymphadenopathy. Reproductive: Prostate is unremarkable. Other: No intraperitoneal free fluid. No intraperitoneal free gas. No organized fluid collection. Musculoskeletal: No abdominal wall hernia or abnormality. No suspicious lytic or blastic osseous lesions. No acute displaced fracture. Multilevel degenerative changes of the spine. IMPRESSION: 1. Hydropic gallbladder with common bile duct dilatation-limited evaluation on this noncontrast study. Correlate with liver function tests and consider right upper quadrant ultrasound. 2. Hepatomegaly. 3. Limited evaluation on this noncontrast study. Electronically Signed   By: Morgane  Naveau M.D.   On: 10/08/2023 19:23   DG Hand Complete Left Result Date: 10/08/2023 CLINICAL DATA:  L ring finger infection / pain EXAM: LEFT HAND - COMPLETE 3+ VIEW COMPARISON:  None Available. FINDINGS: Osteopenia. Extensive peripheral vascular atherosclerosis. Bony cortical destruction of the fourth digit involving the distal phalanx and the distal segment of the middle phalanx. Soft tissue swelling fourth phalanx. IMPRESSION: Soft tissue swelling throughout the fourth phalanx. Bony cortical  destruction involving the fourth mid and distal phalanges, most consistent with osteomyelitis. Electronically Signed   By: Rance Burrows M.D.   On: 10/08/2023 18:49    Procedures .Critical Care  Performed by: Hayes Lipps, PA-C Authorized by: Hayes Lipps, PA-C   Critical care provider statement:    Critical care time (minutes):  63   Critical care time was exclusive of:  Separately billable procedures and treating other patients   Critical care was necessary to treat or prevent imminent or life-threatening deterioration of the following conditions:  Circulatory failure and sepsis   Critical care was time spent personally by me on the following activities:  Development of treatment plan with patient or surrogate, discussions with consultants, discussions with primary provider, evaluation of patient's response to treatment, examination of patient, obtaining history from patient or surrogate, review of old charts, re-evaluation of patient's condition, pulse oximetry,  ordering and review of radiographic studies, ordering and review of laboratory studies and ordering and performing treatments and interventions   I assumed direction of critical care for this patient from another provider in my specialty: yes     Care discussed with: admitting provider       Medications Ordered in ED Medications  vancomycin  (VANCOCIN ) IVPB 1000 mg/200 mL premix (1,000 mg Intravenous New Bag/Given 10/08/23 2214)  sodium chloride  0.9 % bolus 1,000 mL (0 mLs Intravenous Stopped 10/08/23 1807)  sodium chloride  0.9 % bolus 1,000 mL (0 mLs Intravenous Stopped 10/08/23 1841)  piperacillin -tazobactam (ZOSYN ) IVPB 2.25 g (2.25 g Intravenous New Bag/Given 10/08/23 2136)    ED Course/ Medical Decision Making/ A&P Clinical Course as of 10/08/23 2227  Wed Oct 08, 2023  2012 Spoke with Dr. [name] with hand surgery, who said that this finger appears needing amputation and that he can permanent scheduled for surgery  tomorrow.  To ensure that he is n.p.o. by 12 AM .   [CB]  2045 Spoke with cardiology, who said that the episode suspicious for V. tach were indeed not V. tach and no need for amnio at this time. [CB]    Clinical Course User Index [CB] Hayes Lipps, PA-C                                 Medical Decision Making  This patient is a 53 year old male who presents to the ED for concern of right lower quadrant left lower quadrant abdominal pain, bilateral hip pain and low back pain x 2 days, having previously had nausea/vomiting/diarrhea x 1 week.  Also noting a left ring finger that has had a wound x 1 month.  Completed hemodialysis today.  On physical exam, patient is in no acute distress, afebrile, alert and orient x 4, speaking in full sentences, nontachypneic, nontachycardic.  Patient's blood pressure was noted to be low and patient was given 2 L of fluid.  Despite low pressures, patient was smiling and talking and in no acute distress.  On exam, patient had mild lower abdominal tenderness to palpation and mild tenderness over hips, however did not show any pain, only having reported it when palpating.  No wounds noted to hips bilaterally, sacrum, lumbar spine.  Patient noted to have a infected wound on left ring finger, picture noted in chart.  Status post BKA bilaterally.  Unremarkable exam otherwise.  Code sepsis called after initial labs showing elevated white count, low blood pressure and source of infection. X-ray noted osteomyelitis of the left finger.  Patient was given Zosyn  and vancomycin  per pharmacy.  Noted to also have had 2 episodes of V. tach, also consulted with attending Dr. Carylon Claude who requested that we talk to cardiology and possibly order amiodarone.  Cardiology was consulted and they stated this was artifact and not V. tach.  Amiodarone was canceled at that time.  Dr. Kerby Pearson was contacted with hand surgery, who noted that he will see the patient tomorrow for surgery with likely  amputation to follow, to have him be n.p.o. at midnight tonight.  Patient care was transferred over to Dr. Yvonne Hering, hospitalist.  Differential diagnoses prior to evaluation: The emergent differential diagnosis includes, but is not limited to, diverticulitis, pancreatitis, enteritis, cholecystitis, cholangitis, osteomyelitis, gangrene, necrotizing fasciitis. This is not an exhaustive differential.   Past Medical History / Co-morbidities / Social History: Type 2 diabetes, end-stage renal disease, on hemodialysis Monday  Wednesday Friday, HIV, status post BKA bilaterally, hepatitis C, AIDS,   Additional history: Chart reviewed. 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  Cleared by nephrology and was to be followed up in outpatient setting.   Lab Tests/Imaging studies: I personally interpreted labs/imaging and the pertinent results include:    CBC notes an elevated white count of 12.2 and anemia of 10.6 hemoglobin also noted to have platelets of 87 CMP notes a mild hypokalemia 3.1 Otherwise at baseline per previous. Blood cultures pending, APTT noted to be 39 Troponin noted to be 53 with delta pending Lactic acid unremarkable Magnesium  unremarkable X-ray of left hand shows bone cortical structure on the fourth mid and distal phalanges most consistent with osteomyelitis CT scan shows hydropic gallbladder with common bile duct dilation, hepatomegaly Ultrasound right upper quadrant shows blood and gallbladder wall thickening with negative Murphy sign.  Bile duct is distended at 14.8 mm minimally increased from 2021 noted to be possibly chronic.  And increased renal echogenicity of kidney on the right  I agree with the radiologist interpretation.  Cardiac monitoring: EKG obtained and interpreted by myself and attending physician which shows: Sinus rhythm      Medications: I ordered medication including NS, vancomycin , Zosyn .  I have reviewed the patients home medicines and have made adjustments as needed.  Critical Interventions: Provided Zosyn  and vancomycin   Social Determinants of Health:  Spanish-speaking requiring interpreter  Disposition: After consideration of the diagnostic results and the patients response to treatment, I feel that the patient would benefit from admission, patient care transferred to Dr. Yvonne Hering.    Final Clinical Impression(s) / ED Diagnoses Final diagnoses:  Osteomyelitis of left hand, unspecified type ([name])  Sepsis, due to unspecified organism, unspecified whether acute organ dysfunction present [facility])    Rx / DC Orders ED Discharge Orders     None         Hayes Lipps, PA-C 10/08/23 2228    Flonnie Humphrey, DO 10/09/23 1637

## 2023-10-08 NOTE — ED Notes (Signed)
 Phlebotomy at bedside collecting lactic plasma

## 2023-10-08 NOTE — ED Notes (Signed)
 Phlebotomy to collect lactic plasma

## 2023-10-08 NOTE — ED Notes (Signed)
 X-ray at bedside

## 2023-10-08 NOTE — H&P (Signed)
 History and Physical  Alvin Daniels ZOX:[id number] DOB: [birth year] DOA: 10/08/2023  PCP: [name], MD   Chief Complaint: Nausea, vomiting, abdominal pain.  HPI: Alvin Daniels is a 53 y.o. male with medical history significant for ESRD on HD MWF, uncontrolled type 2 diabetes, HIV, chronic hepatitis C, and s/p bilateral BKA who presented to the ED for evaluation of nausea/vomiting and cramping abdominal pain.  ED Course: Initial vitals show temp 98.1, RR 14, HR 81, BP 96/56, SpO2 97% on room air.  Initial labs significant for WBC 12.2, Hgb 10.6, platelet 87, sodium 136, K+ 3.1, stable kidney function, albumin  2.7, AST/ALT 52/34, alk phos 261, bilirubin 1.4, normal lipase, INR and lactic acid, mag 2.1, troponin 53.  EKG shows sinus rhythm. Left hand x-ray shows soft tissue swelling with bony cortical destruction involving the fourth mid and distal phalanx consistent with osteomyelitis.  CT A/P without contrast shows mild CBD dilation and hepatomegaly. RUQ U/S shows gallbladder sludge with mild gallbladder wall thickening but no evidence of acute cholecystitis and minimally increased common bile duct.  Sepsis protocol was activated and patient received IV NS 2 L x 2, IV vancomycin  and IV Zosyn .  Hand surgery was consulted for admission.  Patient thought to have V. tach episodes for cardiology consulted and on the evaluation of the EKG, this was thought to be artifact.  TRH was consulted for admission.  Review of Systems: Please see HPI for pertinent positives and negatives. A complete 10 system review of systems are otherwise negative.  Past Medical History:  Diagnosis Date   Acute bilateral low back pain 01/03/2021   AIDS ([name]) 11/22/2014   Back pain 06/09/2019   BKA stump complication ([name]) 06/09/2019   Chronic diarrhea    Chronic hepatitis C without hepatic coma ([name]) 11/22/2014   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[name])    Right   Corneal ulcer 12/18/2022    Descemetocele of left eye 12/18/2022   Diabetic neuropathy ([name])    ESRD (end stage renal disease) on dialysis ([name])    "TTS; don't remember street name" (05/03/2014)   Hepatitis C    HIV INFECTION 06/27/2010   Qualifier: Diagnosis of  By: Oretha Birch CMA ( AAMA), Jacqueline     Hypotension 06/02/2012   Metabolic bone disease 06/18/2012   MRSA infection    Normocytic anemia 06/17/2012   Pancreatitis    Pressure ulcer of BKA stump, stage 2 ([name]) 11/22/2014   Renal disorder    Renal insufficiency    Severe protein-calorie malnutrition ([name]) 06/17/2012   Uncontrolled diabetes mellitus with complications 06/27/2010   Annotation: uncontrolled Qualifier: Diagnosis of  By: Oretha Birch CMA Nonie Beady), Jacqueline     Vaccine counseling 05/29/2022   Past Surgical History:  Procedure Laterality Date   AMPUTATION Left 04/20/2014   Procedure: 3rd toe amputation, 4th Toe Amputation,  5th Toe Amputation;  Surgeon: [name], MD;  Location: [facility] OR;  Service: Orthopedics;  Laterality: Left;   AMPUTATION Left 05/02/2014   Procedure: Midfoot Amputation;  Surgeon: [name], MD;  Location: [facility] OR;  Service: Orthopedics;  Laterality: Left;   AMPUTATION Left 06/17/2014   Procedure: AMPUTATION BELOW KNEE;  Surgeon: [name], MD;  Location: [facility] OR;  Service: Orthopedics;  Laterality: Left;   AMPUTATION Right 09/04/2018   Procedure: RIGHT BELOW KNEE AMPUTATION;  Surgeon: [name], MD;  Location: [facility] OR;  Service: Orthopedics;  Laterality: Right;  RIGHT BELOW KNEE AMPUTATION   AV FISTULA PLACEMENT Left    AV  FISTULA PLACEMENT Left 05/10/2016   Procedure: Creation Left Arm Brachiocephalic Arteriovenous Fistula and Ligation of Radiocephalic Fistula;  Surgeon: [name], MD;  Location: [facility] OR;  Service: Vascular;  Laterality: Left;   DIALYSIS/PERMA CATHETER INSERTION N/A 09/01/2023   Procedure: DIALYSIS/PERMA CATHETER INSERTION;  Surgeon: Patrick Boor, MD;  Location: [facility] INVASIVE CV LAB;   Service: Cardiovascular;  Laterality: N/A;   FEMUR IM NAIL Left 08/17/2016   Procedure: INTRAMEDULLARY (IM) RETROGRADE FEMORAL NAILING;  Surgeon: Adah Acron, MD;  Location: [facility] OR;  Service: Orthopedics;  Laterality: Left;   FOOT AMPUTATION THROUGH ANKLE Left 12/'21/2015   midfoot   IR AV DIALY SHUNT INTRO NEEDLE/INTRACATH INITIAL W/PTA/IMG LEFT  04/17/2023   IR GENERIC HISTORICAL Left 05/01/2016   IR THROMBECTOMY AV FISTULA W/THROMBOLYSIS/PTA INC/SHUNT/IMG LEFT 05/01/2016 [name], MD [facility]   IR GENERIC HISTORICAL  05/01/2016   IR US  GUIDE VASC ACCESS LEFT 05/01/2016 [name], MD [facility]   IR GENERIC HISTORICAL  05/07/2016   IR FLUORO GUIDE CV LINE RIGHT 05/07/2016 [name], DO [facility]   IR GENERIC HISTORICAL  05/07/2016   IR US  GUIDE VASC ACCESS RIGHT 05/07/2016 [name], DO [facility]   IR GENERIC HISTORICAL  05/22/2016   IR US  GUIDE VASC ACCESS RIGHT 05/22/2016 [name], MD [facility]   IR GENERIC HISTORICAL  05/22/2016   IR FLUORO GUIDE CV LINE RIGHT 05/22/2016 [name], MD [facility]   IR REMOVAL TUN CV CATH W/O [facility]  08/21/2016   IR US  GUIDE VASC ACCESS LEFT  04/17/2023   PERIPHERAL VASCULAR CATHETERIZATION Left 05/09/2016   Procedure: A/V Fistulagram;  Surgeon: [name], MD;  Location: [facility] INVASIVE CV LAB;  Service: Cardiovascular;  Laterality: Left;  arm   TEE WITHOUT CARDIOVERSION N/A 06/22/2018   Procedure: TRANSESOPHAGEAL ECHOCARDIOGRAM (TEE);  Surgeon: [name], MD;  Location: [facility];  Service: Cardiovascular;  Laterality: N/A;   Social History:  reports that he has never smoked. He has never used smokeless tobacco. He reports that he does not drink alcohol  and does not use drugs.  No Known Allergies  Family History  Problem Relation Age of Onset   Diabetes Mother    Diabetes Father      Prior to Admission medications   Medication Sig Start Date End Date Taking? Authorizing Provider   bictegravir-emtricitabine -tenofovir  AF (BIKTARVY ) 50-200-25 MG TABS tablet Take 1 tablet by mouth daily. 04/16/23   [name], MD  insulin  aspart protamine  - aspart (NOVOLOG  70/30 FLEXPEN) (70-30) 100 UNIT/ML FlexPen Inject 20 Units into the skin 2 (two) times daily. Patient taking differently: Inject 15 Units into the skin 2 (two) times daily. 07/17/23   [name], MD  midodrine  (PROAMATINE ) 10 MG tablet Take 1 tablet (10 mg total) by mouth 3 (three) times daily as needed for low blood pressure, SBP</=100. may take 30 min prior to dialysis and mid treatment as needed. 09/05/23     bictegravir-emtricitabine -tenofovir  AF (BIKTARVY ) 50-200-25 MG TABS tablet Take 1 tablet by mouth daily. 01/02/22   [name], MD    Physical Exam: BP 95/66   Pulse 95   Temp 99.5 F (37.5 C) (Oral)   Resp 15   Ht 4\' 11"  (1.499 m)   Wt 51 kg   SpO2 93%   BMI 22.71 kg/m  General: Pleasant, well-appearing *** laying in bed. No acute distress. HEENT: [place]/AT. Anicteric sclera CV: RRR. No murmurs, rubs, or gallops. No LE edema Pulmonary:  Lungs CTAB. Normal effort. No wheezing or rales. Abdominal: Soft, nontender, nondistended. Normal bowel sounds. Extremities: Palpable radial and DP pulses. Normal ROM. Skin: Warm and dry. No obvious rash or lesions. Neuro: A&Ox3. Moves all extremities. Normal sensation to light touch. No focal deficit. Psych: Normal mood and affect          Labs on Admission:  Basic Metabolic Panel: Recent Labs  Lab 10/08/23 1634 10/08/23 1947  NA 136  --   K 3.1*  --   CL 95*  --   CO2 26  --   GLUCOSE 79  --   BUN 28*  --   CREATININE 6.93*  --   CALCIUM  8.9  --   MG  --  2.1   Liver Function Tests: Recent Labs  Lab 10/08/23 1634  AST 52*  ALT 34  ALKPHOS 261*  BILITOT 1.4*  PROT 7.9  ALBUMIN  2.7*   Recent Labs  Lab 10/08/23 1634  LIPASE 32   No results for input(s): "AMMONIA" in the last 168 hours. CBC: Recent Labs  Lab 10/08/23 1634   WBC 12.2*  NEUTROABS 10.7*  HGB 10.6*  HCT 31.2*  MCV 97.2  PLT 87*   Cardiac Enzymes: No results for input(s): "CKTOTAL", "CKMB", "CKMBINDEX", "TROPONINI" in the last 168 hours. BNP (last 3 results) No results for input(s): "BNP" in the last 8760 hours.  ProBNP (last 3 results) No results for input(s): "PROBNP" in the last 8760 hours.  CBG: No results for input(s): "GLUCAP" in the last 168 hours.  Radiological Exams on Admission: US  Abdomen Limited RUQ (LIVER/GB) Result Date: 10/08/2023 CLINICAL DATA:  Epigastric pain. EXAM: ULTRASOUND ABDOMEN LIMITED RIGHT UPPER QUADRANT COMPARISON:  10/08/2023, 09/28/2019 at. FINDINGS: Gallbladder: Echogenic material is present in the dependent portion of the gallbladder. There is mild gallbladder wall thickening at 4.6 mm. No sonographic Murphy sign noted by sonographer. Common bile duct: Diameter: 14.8 mm Liver: No focal lesion identified. Within normal limits in parenchymal echogenicity. Portal vein is patent on color Doppler imaging with normal direction of blood flow towards the liver. Other: Shadowing calcifications in the right kidney are noted, compatible with no vascular calcifications. Increased renal parenchymal echogenicity is noted. IMPRESSION: 1. Echogenic material in the gallbladder, possible sludge with mild gallbladder wall thickening. No sonographic Abigail Abler sign is see to suggest acute cholecystitis. 2. Distended common bile duct measuring 14.8 mm, minimally increased from 2021 and may be chronic. 3. Increased renal echogenicity on the right, suggesting medical renal disease. Electronically Signed   By: Wyvonnia Heimlich M.D.   On: 10/08/2023 20:41   CT ABDOMEN PELVIS WO CONTRAST Result Date: 10/08/2023 CLINICAL DATA:  LLQ abdominal pain RLQ abdominal pain EXAM: CT ABDOMEN AND PELVIS WITHOUT CONTRAST TECHNIQUE: Multidetector CT imaging of the abdomen and pelvis was performed following the standard protocol without IV contrast. RADIATION DOSE  REDUCTION: This exam was performed according to the departmental dose-optimization program which includes automated exposure control, adjustment of the mA and/or kV according to patient size and/or use of iterative reconstruction technique. COMPARISON:  CT abdomen pelvis 09/27/2019, ultrasound abdomen 09/28/2019 FINDINGS: Lower chest: No acute abnormality. Similar-appearing bilateral lower lobe atelectasis versus scarring. Hepatobiliary: Liver is enlarged measuring up to 20 cm. No focal liver abnormality. No gallstones, gallbladder wall thickening, or pericholecystic fluid. Common bile duct dilatation measuring up to 14 mm. Hydropic gallbladder. No definite intrahepatic biliary ductal dilatation. Pancreas: No focal lesion. Normal pancreatic contour. No surrounding inflammatory changes. No main pancreatic ductal dilatation. Spleen: Normal in  size without focal abnormality. Adrenals/Urinary Tract: No adrenal nodule bilaterally. No nephrolithiasis and no hydronephrosis. No definite contour-deforming renal mass. No ureterolithiasis or hydroureter. The urinary bladder is unremarkable. Stomach/Bowel: Stomach is within normal limits. No evidence of bowel wall thickening or dilatation. Prominent rectosigmoid wall likely due to under distension. Under distended bowel. Appendix appears normal. Vascular/Lymphatic: No abdominal aorta or iliac aneurysm. Moderate to severe atherosclerotic plaque of the aorta and its branches. No abdominal, pelvic, or inguinal lymphadenopathy. Reproductive: Prostate is unremarkable. Other: No intraperitoneal free fluid. No intraperitoneal free gas. No organized fluid collection. Musculoskeletal: No abdominal wall hernia or abnormality. No suspicious lytic or blastic osseous lesions. No acute displaced fracture. Multilevel degenerative changes of the spine. IMPRESSION: 1. Hydropic gallbladder with common bile duct dilatation-limited evaluation on this noncontrast study. Correlate with liver  function tests and consider right upper quadrant ultrasound. 2. Hepatomegaly. 3. Limited evaluation on this noncontrast study. Electronically Signed   By: Morgane  Naveau M.D.   On: 10/08/2023 19:23   DG Hand Complete Left Result Date: 10/08/2023 CLINICAL DATA:  L ring finger infection / pain EXAM: LEFT HAND - COMPLETE 3+ VIEW COMPARISON:  None Available. FINDINGS: Osteopenia. Extensive peripheral vascular atherosclerosis. Bony cortical destruction of the fourth digit involving the distal phalanx and the distal segment of the middle phalanx. Soft tissue swelling fourth phalanx. IMPRESSION: Soft tissue swelling throughout the fourth phalanx. Bony cortical destruction involving the fourth mid and distal phalanges, most consistent with osteomyelitis. Electronically Signed   By: Rance Burrows M.D.   On: 10/08/2023 18:49   Assessment/Plan Alvin Daniels is a 53 y.o. male with medical history significant for ESRD on HD MWF, uncontrolled type 2 diabetes, HIV, chronic hepatitis C, and s/p bilateral BKA who presented to the ED for evaluation of nausea/vomiting and cramping abdominal pain and admitted for sepsis secondary to left fourth finger osteomyelitis.  # Sepsis # Osteomyelitis of the left fourth finger  # Abdominal pain ***  #ESRD on HD  # Type 2 diabetes with hyperglycemia - Last A1c 10.0% on 07/17/2023  # HIV  # Bilateral BKA - Uses prosthetics  #***  DVT prophylaxis: Lovenox      Code Status: Prior  Consults called: Hand surgery  Family Communication: ***  Severity of Illness: The appropriate patient status for this patient is INPATIENT. Inpatient status is judged to be reasonable and necessary in order to provide the required intensity of service to ensure the patient's safety. The patient's presenting symptoms, physical exam findings, and initial radiographic and laboratory data in the context of their chronic comorbidities is felt to place them at high risk for  further clinical deterioration. Furthermore, it is not anticipated that the patient will be medically stable for discharge from the hospital within 2 midnights of admission.   * I certify that at the point of admission it is my clinical judgment that the patient will require inpatient hospital care spanning beyond 2 midnights from the point of admission due to high intensity of service, high risk for further deterioration and high frequency of surveillance required.*  Level of care: Telemetry Medical   This record has been created using Conservation officer, historic buildings. Errors have been sought and corrected, but may not always be located. Such creation errors do not reflect on the standard of care.   Vita Grip, MD 10/08/2023, 9:57 PM [facility] Pager: [phone] Isaiah 41:10   If 7PM-7AM, please contact night-coverage [email or web address] Password [email or web address]

## 2023-10-08 NOTE — Sepsis Progress Note (Signed)
 Elink monitoring for the code sepsis protocol.

## 2023-10-09 ENCOUNTER — Other Ambulatory Visit: Payer: Self-pay

## 2023-10-09 ENCOUNTER — Inpatient Hospital Stay (HOSPITAL_COMMUNITY): Payer: Self-pay | Admitting: Certified Registered"

## 2023-10-09 ENCOUNTER — Encounter (HOSPITAL_COMMUNITY)
Admission: EM | Disposition: A | Discharge status: Home / Self Care | Payer: Self-pay | Source: Home / Self Care | Attending: Internal Medicine

## 2023-10-09 ENCOUNTER — Inpatient Hospital Stay (HOSPITAL_COMMUNITY): Payer: Self-pay

## 2023-10-09 ENCOUNTER — Encounter (HOSPITAL_COMMUNITY): Payer: Self-pay | Admitting: Student

## 2023-10-09 DIAGNOSIS — I96 Gangrene, not elsewhere classified: Secondary | ICD-10-CM

## 2023-10-09 DIAGNOSIS — K838 Other specified diseases of biliary tract: Secondary | ICD-10-CM

## 2023-10-09 DIAGNOSIS — N186 End stage renal disease: Secondary | ICD-10-CM

## 2023-10-09 DIAGNOSIS — M869 Osteomyelitis, unspecified: Secondary | ICD-10-CM

## 2023-10-09 DIAGNOSIS — R6520 Severe sepsis without septic shock: Secondary | ICD-10-CM

## 2023-10-09 DIAGNOSIS — E1169 Type 2 diabetes mellitus with other specified complication: Secondary | ICD-10-CM

## 2023-10-09 DIAGNOSIS — Z992 Dependence on renal dialysis: Secondary | ICD-10-CM

## 2023-10-09 DIAGNOSIS — B9562 Methicillin resistant Staphylococcus aureus infection as the cause of diseases classified elsewhere: Secondary | ICD-10-CM

## 2023-10-09 DIAGNOSIS — Z794 Long term (current) use of insulin: Secondary | ICD-10-CM

## 2023-10-09 DIAGNOSIS — A4102 Sepsis due to Methicillin resistant Staphylococcus aureus: Secondary | ICD-10-CM

## 2023-10-09 DIAGNOSIS — R7881 Bacteremia: Secondary | ICD-10-CM

## 2023-10-09 DIAGNOSIS — B20 Human immunodeficiency virus [HIV] disease: Secondary | ICD-10-CM

## 2023-10-09 DIAGNOSIS — E1151 Type 2 diabetes mellitus with diabetic peripheral angiopathy without gangrene: Secondary | ICD-10-CM

## 2023-10-09 HISTORY — PX: AMPUTATION FINGER: SHX6594

## 2023-10-09 LAB — BLOOD CULTURE ID PANEL (REFLEXED) - BCID2

## 2023-10-09 LAB — RENAL FUNCTION PANEL
Albumin: 2.3 g/dL — ABNORMAL LOW (ref 3.5–5.0)
Anion gap: 12 (ref 5–15)
BUN: 33 mg/dL — ABNORMAL HIGH (ref 6–20)
CO2: 21 mmol/L — ABNORMAL LOW (ref 22–32)
Calcium: 8.5 mg/dL — ABNORMAL LOW (ref 8.9–10.3)
Chloride: 101 mmol/L (ref 98–111)
Creatinine, Ser: 7.78 mg/dL — ABNORMAL HIGH (ref 0.61–1.24)
GFR, Estimated: 8 mL/min — ABNORMAL LOW (ref >60)
Glucose, Bld: 149 mg/dL — ABNORMAL HIGH (ref 70–99)
Phosphorus: 4.5 mg/dL (ref 2.5–4.6)
Potassium: 3.8 mmol/L (ref 3.5–5.1)
Sodium: 134 mmol/L — ABNORMAL LOW (ref 135–145)

## 2023-10-09 LAB — GLUCOSE, CAPILLARY
Glucose-Capillary: 100 mg/dL — ABNORMAL HIGH (ref 70–99)
Glucose-Capillary: 131 mg/dL — ABNORMAL HIGH (ref 70–99)
Glucose-Capillary: 139 mg/dL — ABNORMAL HIGH (ref 70–99)
Glucose-Capillary: 140 mg/dL — ABNORMAL HIGH (ref 70–99)
Glucose-Capillary: 141 mg/dL — ABNORMAL HIGH (ref 70–99)
Glucose-Capillary: 142 mg/dL — ABNORMAL HIGH (ref 70–99)
Glucose-Capillary: 152 mg/dL — ABNORMAL HIGH (ref 70–99)

## 2023-10-09 LAB — SURGICAL PCR SCREEN
MRSA, PCR: POSITIVE — AB
Staphylococcus aureus: POSITIVE — AB

## 2023-10-09 LAB — CBC
HCT: 28.9 % — ABNORMAL LOW (ref 39.0–52.0)
Hemoglobin: 9.5 g/dL — ABNORMAL LOW (ref 13.0–17.0)
MCH: 32.5 pg (ref 26.0–34.0)
MCHC: 32.9 g/dL (ref 30.0–36.0)
MCV: 99 fL (ref 80.0–100.0)
Platelets: 74 10*3/uL — ABNORMAL LOW (ref 150–400)
RBC: 2.92 MIL/uL — ABNORMAL LOW (ref 4.22–5.81)
RDW: 14.3 % (ref 11.5–15.5)
WBC: 12.2 10*3/uL — ABNORMAL HIGH (ref 4.0–10.5)
nRBC: 0 % (ref 0.0–0.2)

## 2023-10-09 LAB — T-HELPER CELLS (CD4) COUNT (NOT AT ARMC)
CD4 % Helper T Cell: 27 % — ABNORMAL LOW (ref 33–65)
CD4 T Cell Abs: 254 /uL — ABNORMAL LOW (ref 400–1790)

## 2023-10-09 LAB — HEPATITIS B SURFACE ANTIGEN: Hepatitis B Surface Ag: NONREACTIVE

## 2023-10-09 SURGERY — AMPUTATION, FINGER
Anesthesia: Monitor Anesthesia Care | Laterality: Left

## 2023-10-09 MED ORDER — SODIUM CHLORIDE 0.9 % IV SOLN
2.0000 g | INTRAVENOUS | Status: DC
  Administered 2023-10-09 – 2023-10-10 (×2): 2 g via INTRAVENOUS
  Filled 2023-10-09 (×2): qty 20

## 2023-10-09 MED ORDER — PROPOFOL 10 MG/ML IV BOLUS
INTRAVENOUS | Status: AC
  Filled 2023-10-09: qty 20

## 2023-10-09 MED ORDER — 0.9 % SODIUM CHLORIDE (POUR BTL) OPTIME
TOPICAL | Status: DC | PRN
  Administered 2023-10-09: 1000 mL

## 2023-10-09 MED ORDER — SODIUM CHLORIDE 0.9 % IV SOLN
INTRAVENOUS | Status: DC | PRN
Start: 2023-10-09 — End: 2023-10-09

## 2023-10-09 MED ORDER — FENTANYL CITRATE (PF) 250 MCG/5ML IJ SOLN
INTRAMUSCULAR | Status: AC
  Filled 2023-10-09: qty 5

## 2023-10-09 MED ORDER — ONDANSETRON HCL 4 MG/2ML IJ SOLN: 4.0000 mg | Freq: Once | INTRAMUSCULAR | Status: DC | PRN

## 2023-10-09 MED ORDER — ALBUMIN HUMAN 5 % IV SOLN
12.5000 g | Freq: Once | INTRAVENOUS | Status: AC
  Administered 2023-10-09: 12.5 g via INTRAVENOUS

## 2023-10-09 MED ORDER — CHLORHEXIDINE GLUCONATE 0.12 % MT SOLN
OROMUCOSAL | Status: AC
  Administered 2023-10-09: 15 mL
  Filled 2023-10-09: qty 15

## 2023-10-09 MED ORDER — ALBUMIN HUMAN 5 % IV SOLN
INTRAVENOUS | Status: AC
  Filled 2023-10-09: qty 250

## 2023-10-09 MED ORDER — PROPOFOL 1000 MG/100ML IV EMUL
INTRAVENOUS | Status: AC
  Filled 2023-10-09: qty 200

## 2023-10-09 MED ORDER — INSULIN ASPART 100 UNIT/ML IJ SOLN: 0.0000 [IU] | INTRAMUSCULAR | Status: DC | PRN

## 2023-10-09 MED ORDER — FENTANYL CITRATE (PF) 250 MCG/5ML IJ SOLN
INTRAMUSCULAR | Status: DC | PRN
  Administered 2023-10-09: 50 ug via INTRAVENOUS

## 2023-10-09 MED ORDER — MIDAZOLAM HCL 2 MG/2ML IJ SOLN
INTRAMUSCULAR | Status: AC
Start: 2023-10-09 — End: ?
  Filled 2023-10-09: qty 2

## 2023-10-09 MED ORDER — MUPIROCIN 2 % EX OINT
1.0000 | TOPICAL_OINTMENT | Freq: Two times a day (BID) | CUTANEOUS | Status: AC
Start: 2023-10-09 — End: 2023-10-13
  Administered 2023-10-09 – 2023-10-13 (×10): 1 via NASAL
  Filled 2023-10-09 (×2): qty 22

## 2023-10-09 MED ORDER — OXYCODONE HCL 5 MG PO TABS: 5.0000 mg | ORAL_TABLET | Freq: Once | ORAL | Status: DC | PRN

## 2023-10-09 MED ORDER — ALBUMIN HUMAN 25 % IV SOLN: 25.0000 g | INTRAVENOUS | Status: AC | PRN

## 2023-10-09 MED ORDER — POTASSIUM CHLORIDE CRYS ER 20 MEQ PO TBCR
40.0000 meq | EXTENDED_RELEASE_TABLET | Freq: Once | ORAL | Status: AC
  Administered 2023-10-09: 40 meq via ORAL
  Filled 2023-10-09: qty 2

## 2023-10-09 MED ORDER — HYDROMORPHONE HCL 1 MG/ML IJ SOLN
0.5000 mg | Freq: Four times a day (QID) | INTRAMUSCULAR | Status: DC | PRN
  Administered 2023-10-09: 0.5 mg via INTRAVENOUS
  Filled 2023-10-09: qty 0.5

## 2023-10-09 MED ORDER — PHENYLEPHRINE HCL-NACL 20-0.9 MG/250ML-% IV SOLN
INTRAVENOUS | Status: DC | PRN
  Administered 2023-10-09: 50 ug/min via INTRAVENOUS

## 2023-10-09 MED ORDER — CHLORHEXIDINE GLUCONATE CLOTH 2 % EX PADS
6.0000 | MEDICATED_PAD | Freq: Every day | CUTANEOUS | Status: DC
  Administered 2023-10-10 – 2023-10-16 (×7): 6 via TOPICAL

## 2023-10-09 MED ORDER — PHENYLEPHRINE 80 MCG/ML (10ML) SYRINGE FOR IV PUSH (FOR BLOOD PRESSURE SUPPORT)
PREFILLED_SYRINGE | INTRAVENOUS | Status: AC
  Filled 2023-10-09: qty 10

## 2023-10-09 MED ORDER — LIDOCAINE 5 % EX PTCH
1.0000 | MEDICATED_PATCH | CUTANEOUS | Status: DC
  Administered 2023-10-09 – 2023-10-18 (×9): 1 via TRANSDERMAL
  Filled 2023-10-09 (×11): qty 1

## 2023-10-09 MED ORDER — BICTEGRAVIR-EMTRICITAB-TENOFOV 50-200-25 MG PO TABS
1.0000 | ORAL_TABLET | Freq: Every day | ORAL | Status: DC
  Administered 2023-10-09 – 2023-10-18 (×10): 1 via ORAL
  Filled 2023-10-09 (×10): qty 1

## 2023-10-09 MED ORDER — BUPIVACAINE HCL (PF) 0.25 % IJ SOLN
INTRAMUSCULAR | Status: AC
Start: 2023-10-09 — End: ?
  Filled 2023-10-09: qty 30

## 2023-10-09 MED ORDER — VANCOMYCIN HCL 500 MG IV SOLR
500.0000 mg | INTRAVENOUS | Status: DC
  Administered 2023-10-10: 500 mg via INTRAVENOUS
  Filled 2023-10-09: qty 10

## 2023-10-09 MED ORDER — PROPOFOL 500 MG/50ML IV EMUL
INTRAVENOUS | Status: DC | PRN
  Administered 2023-10-09: 65 ug/kg/min via INTRAVENOUS

## 2023-10-09 MED ORDER — HYDROMORPHONE HCL 1 MG/ML IJ SOLN: 0.2500 mg | INTRAMUSCULAR | Status: DC | PRN

## 2023-10-09 MED ORDER — PHENYLEPHRINE 80 MCG/ML (10ML) SYRINGE FOR IV PUSH (FOR BLOOD PRESSURE SUPPORT)
PREFILLED_SYRINGE | INTRAVENOUS | Status: DC | PRN
  Administered 2023-10-09 (×2): 120 ug via INTRAVENOUS

## 2023-10-09 MED ORDER — CHLORHEXIDINE GLUCONATE CLOTH 2 % EX PADS
6.0000 | MEDICATED_PAD | Freq: Every day | CUTANEOUS | Status: DC
  Administered 2023-10-09 – 2023-10-11 (×3): 6 via TOPICAL

## 2023-10-09 MED ORDER — MIDAZOLAM HCL 2 MG/2ML IJ SOLN
INTRAMUSCULAR | Status: DC | PRN
  Administered 2023-10-09: 2 mg via INTRAVENOUS

## 2023-10-09 MED ORDER — MIDODRINE HCL 5 MG PO TABS: 10.0000 mg | ORAL_TABLET | Freq: Three times a day (TID) | ORAL | Status: DC | PRN

## 2023-10-09 MED ORDER — BUPIVACAINE HCL (PF) 0.25 % IJ SOLN
INTRAMUSCULAR | Status: DC | PRN
  Administered 2023-10-09: 15 mL

## 2023-10-09 MED ORDER — HYDROMORPHONE HCL 1 MG/ML IJ SOLN
0.5000 mg | INTRAMUSCULAR | Status: DC | PRN
  Administered 2023-10-09 – 2023-10-17 (×19): 0.5 mg via INTRAVENOUS
  Filled 2023-10-09 (×20): qty 0.5

## 2023-10-09 MED ORDER — OXYCODONE HCL 5 MG/5ML PO SOLN: 5.0000 mg | Freq: Once | ORAL | Status: DC | PRN

## 2023-10-09 MED ORDER — MIDODRINE HCL 5 MG PO TABS
10.0000 mg | ORAL_TABLET | Freq: Three times a day (TID) | ORAL | Status: DC
  Administered 2023-10-09 – 2023-10-18 (×26): 10 mg via ORAL
  Filled 2023-10-09 (×24): qty 2

## 2023-10-09 SURGICAL SUPPLY — 41 items
BAG COUNTER SPONGE SURGICOUNT (BAG) ×1 IMPLANT
BNDG COHESIVE 1X5 TAN STRL LF (GAUZE/BANDAGES/DRESSINGS) ×1 IMPLANT
BNDG ELASTIC 2X5.8 VLCR STR LF (GAUZE/BANDAGES/DRESSINGS) ×1 IMPLANT
BNDG ELASTIC 3INX 5YD STR LF (GAUZE/BANDAGES/DRESSINGS) IMPLANT
BNDG ELASTIC 4X5.8 VLCR STR LF (GAUZE/BANDAGES/DRESSINGS) IMPLANT
BNDG GAUZE DERMACEA FLUFF 4 (GAUZE/BANDAGES/DRESSINGS) ×1 IMPLANT
CORD BIPOLAR FORCEPS 12FT (ELECTRODE) ×1 IMPLANT
COVER SURGICAL LIGHT HANDLE (MISCELLANEOUS) ×1 IMPLANT
CUFF TOURN SGL QUICK 18X4 (TOURNIQUET CUFF) ×1 IMPLANT
CUFF TRNQT CYL 24X4X16.5-23 (TOURNIQUET CUFF) IMPLANT
DRAIN PENROSE 0.25X18 (DRAIN) IMPLANT
DRAPE SURG 17X23 STRL (DRAPES) ×1 IMPLANT
DRSG ADAPTIC 3X8 NADH LF (GAUZE/BANDAGES/DRESSINGS) ×1 IMPLANT
DRSG XEROFORM 1X8 (GAUZE/BANDAGES/DRESSINGS) IMPLANT
GAUZE SPONGE 2X2 8PLY STRL LF (GAUZE/BANDAGES/DRESSINGS) IMPLANT
GAUZE SPONGE 4X4 12PLY STRL (GAUZE/BANDAGES/DRESSINGS) IMPLANT
GAUZE STRETCH 2X75IN STRL (MISCELLANEOUS) IMPLANT
GLOVE BIO SURGEON STRL SZ7 (GLOVE) ×1 IMPLANT
GLOVE BIOGEL PI IND STRL 7.0 (GLOVE) ×1 IMPLANT
GOWN STRL REUS W/ TWL LRG LVL3 (GOWN DISPOSABLE) ×2 IMPLANT
GOWN STRL REUS W/ TWL XL LVL3 (GOWN DISPOSABLE) ×1 IMPLANT
KIT BASIN OR (CUSTOM PROCEDURE TRAY) ×1 IMPLANT
KIT TURNOVER KIT B (KITS) ×1 IMPLANT
MANIFOLD NEPTUNE II (INSTRUMENTS) ×1 IMPLANT
NDL HYPO 25GX1X1/2 BEV (NEEDLE) IMPLANT
NEEDLE HYPO 25GX1X1/2 BEV (NEEDLE) IMPLANT
NS IRRIG 1000ML POUR BTL (IV SOLUTION) ×1 IMPLANT
PACK ORTHO EXTREMITY (CUSTOM PROCEDURE TRAY) ×1 IMPLANT
PAD ARMBOARD POSITIONER FOAM (MISCELLANEOUS) ×2 IMPLANT
PAD CAST 4YDX4 CTTN HI CHSV (CAST SUPPLIES) IMPLANT
PADDING CAST COTTON 2X4 NS (CAST SUPPLIES) IMPLANT
SOAP 2 % CHG 4 OZ (WOUND CARE) ×1 IMPLANT
SPECIMEN JAR SMALL (MISCELLANEOUS) ×1 IMPLANT
SUT ETHILON 4 0 PS 2 18 (SUTURE) IMPLANT
SUT MERSILENE 4 0 P 3 (SUTURE) IMPLANT
SUT PROLENE 4 0 PS 2 18 (SUTURE) IMPLANT
SYR CONTROL 10ML LL (SYRINGE) IMPLANT
TOWEL GREEN STERILE (TOWEL DISPOSABLE) ×1 IMPLANT
TOWEL GREEN STERILE FF (TOWEL DISPOSABLE) ×1 IMPLANT
TUBE CONNECTING 12X1/4 (SUCTIONS) IMPLANT
WATER STERILE IRR 1000ML POUR (IV SOLUTION) ×1 IMPLANT

## 2023-10-09 NOTE — Progress Notes (Signed)
 Pharmacy Antibiotic Note  Alvin Daniels is a 53 y.o. male admitted on 10/08/2023 with sepsis secondary to left fourth finger osteomyelitis.  Pharmacy has been consulted for vancomycin  dosing.  Plan: Vancomycin  1g IV given in ED 5/28 ~22:00. Continue with 500mg  IV after each HD (MWF) Check vancomycin  levels as needed Follow up cultures as available, clinical progress, length of tx  Height: 4\' 11"  (149.9 cm) Weight: 51 kg (112 lb 7 oz) IBW/kg (Calculated) : 47.7  Temp (24hrs), Avg:98.9 F (37.2 C), Min:98 F (36.7 C), Max:99.8 F (37.7 C)  Recent Labs  Lab 10/08/23 1634 10/08/23 1818 10/08/23 2001  WBC 12.2*  --   --   CREATININE 6.93*  --   --   LATICACIDVEN  --  1.3 0.9    Estimated Creatinine Clearance: 8.4 mL/min (A) (by C-G formula based on SCr of 6.93 mg/dL (H)).    No Known Allergies  Antimicrobials this admission: 5/28 Zosyn  x 1 5/28 Vanc >> 5/29 Ceftriaxone  >>  Dose adjustments this admission:  Microbiology results: 5/28 BCx:  Thank you for allowing pharmacy to be a part of this patient's care.  Armanda Bern, PharmD, BCPS 10/09/2023 2:08 AM

## 2023-10-09 NOTE — Interval H&P Note (Signed)
 History and Physical Interval Note:  10/09/2023 12:39 PM  Alvin Daniels  has presented today for surgery, with the diagnosis of Left Ring finger osteomyelitis.  The various methods of treatment have been discussed with the patient and family. After consideration of risks, benefits and other options for treatment, the patient has consented to  Procedure(s) with comments: AMPUTATION, FINGER (Left) - Left Ring Finger amputation as a surgical intervention.  The patient's history has been reviewed, patient examined, no change in status, stable for surgery.  I have reviewed the patient's chart and labs.  Questions were answered to the patient's satisfaction.     [name]

## 2023-10-09 NOTE — Consult Note (Signed)
 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 Lspine xray negative but sx only of a week  Cxr right lung nodular opacity ?septic emboli  Tte  If the bone/joint imaging is suggestive of involvement and bcx clearance is achieved can avoid tee  Left avf doesn't appear grossly infected but some tenderness ?focal involvement less likely, maybe shoulder referred pain, but will ask nephrology to comment on  Hiv controlled on biktarvy ; sees rcid Lab Results  Component Value Date   HIV1RNAQUANT 27 (H) 04/16/2023   Lab Results  Component Value Date   CD4TCELL 25 (L) 04/16/2023   CD4TABS 315 (L) 04/16/2023      PLAN:  Mri lumbar spine/left shoulder Discussed with nephrology about line holiday and dr Zana Hesselbach will dialyze then remove line Repeat blood cx the day after line removal Tte If repeat bcx positive and no obvious mri explanation will consider tee Continue vancomycin   Maintain contact isolation Continue biktarvy  F/u rcid on discharge Discussed with primary team    Principal Problem:   Sepsis ([name]) Active Problems:   Hypokalemia   Osteomyelitis of left hand ([name])   Biliary sludge  determined by ultrasound    bictegravir-emtricitabine -tenofovir  AF  1 tablet Oral Daily   Chlorhexidine  Gluconate Cloth  6 each Topical Q0600   heparin   5,000 Units Subcutaneous Q8H   insulin  aspart  0-6 Units Subcutaneous Q4H   lidocaine   1 patch Transdermal Q24H   mupirocin  ointment  1 Application Nasal BID     HPI: Alvin Daniels is a 53 y.o. male with previous medical history of diabetes complicated by diabetic neuropathy and previous amputations, end-stage renal disease on hemodialysis, and well-controlled HIV disease presenting to the ED with nausea, vomiting, and diarrhea.  [name] presented with 1 week history of nausea, vomiting, and diarrhea with development of progressively worsening abdominal pain.  Also noted to have a wound on his left fourth finger which has become increasingly more painful with decreased sensation.  Afebrile with leukocytosis of 12,200.  Left hand x-ray with soft tissue swelling throughout the fourth phalanx and bony cortical destruction involving the fourth mid and distal phalange consistent with osteomyelitis.  CT abdomen/pelvis with hydropic gallbladder with common bile duct dilation.  Ultrasound abdomen with echogenic material in the gallbladder possibly sludge with no sonographic Murphy sign.  Chest x-ray today with new patchy/nodular densities clustered in the right lower lung.  Currently receives dialysis through right internal jugular tunneled catheter.  Received broad-spectrum antibiotics with vancomycin  and piperacillin -tazobactam.  Blood cultures now positive for MRSA.  [name] is known to the ID service and was last seen by Dr. Ernie Heal  on 04/16/2023 with well-controlled virus and good adherence and tolerance to Biktarvy .  Viral load was undetectable with CD4 count 315.  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   Diabetic neuropathy ([name])    ESRD (end stage renal disease) on dialysis ([name])    "TTS; don't remember street name" (05/03/2014)   Hepatitis C    HIV INFECTION 06/27/2010   Qualifier: Diagnosis of  By: Oretha Birch CMA ( AAMA), Jacqueline     Hypotension 06/02/2012   Metabolic bone disease 06/18/2012   MRSA infection    Normocytic anemia 06/17/2012   Pancreatitis    Pressure ulcer of BKA stump, stage 2 ([name]) 11/22/2014   Renal disorder    Renal insufficiency    Severe protein-calorie malnutrition ([name]) 06/17/2012   Uncontrolled diabetes mellitus with complications 06/27/2010   Annotation: uncontrolled Qualifier: Diagnosis of  By: Oretha Birch CMA Nonie Beady), Jacqueline     Vaccine counseling 05/29/2022    Social History   Tobacco Use   Smoking status: Never   Smokeless tobacco: Never  Vaping Use   Vaping status: Never Used  Substance Use Topics   Alcohol  use: No   Drug use: No    Family History  Problem Relation Age of Onset   Diabetes Mother    Diabetes Father     No Known Allergies  OBJECTIVE: Blood pressure 95/74, pulse 80, temperature 98.2 F (36.8 C), temperature source Oral, resp. rate 17, height 4\' 11"  (1.499 m), weight 51 kg, SpO2 96%.  Physical Exam General/constitutional: no distress, pleasant, conversant HEENT: Normocephalic, PER, Conj Clear, EOMI, Oropharynx clear Neck supple CV: rrr no mrg Lungs: clear to auscultation, normal respiratory effort Abd: Soft, Nontender Ext: no edema Skin: scarring plaque changes right chest/shoulder with linear scabbing  noted; right tunneled cath sites nontender and no purulence Neuro: nonfocal MSK: bilateral bka stump no ulceration; tender on palpation left groin and l-spine and left shoulder; relatively intact rom left shoulder/left leg; left avf site no warmth/erythema/fluctuance but slightly tender   Lab Results Lab Results  Component Value Date   WBC 12.2 (H) 10/09/2023   HGB 9.5 (L) 10/09/2023   HCT 28.9 (L) 10/09/2023   MCV 99.0 10/09/2023   PLT 74 (L) 10/09/2023    Lab Results  Component Value Date   CREATININE 7.78 (H) 10/09/2023   BUN 33 (H) 10/09/2023   NA 134 (L) 10/09/2023   K 3.8 10/09/2023   CL 101 10/09/2023   CO2 21 (L) 10/09/2023    Lab Results  Component Value Date   ALT 34 10/08/2023   AST 52 (H) 10/08/2023   ALKPHOS 261 (H) 10/08/2023   BILITOT 1.4 (H) 10/08/2023     Microbiology: Recent Results (from the past 240 hours)  Blood Cultures x 2 sites     Status: None (Preliminary result)   Collection Time: 10/08/23  7:38 PM   Specimen: BLOOD RIGHT HAND  Result Value Ref Range Status  Specimen Description BLOOD RIGHT HAND  Final   Special Requests   Final    BOTTLES DRAWN AEROBIC AND ANAEROBIC Blood Culture adequate volume   Culture  Setup Time   Final    GRAM POSITIVE COCCI IN BOTH AEROBIC AND ANAEROBIC BOTTLES CRITICAL VALUE NOTED.  VALUE IS CONSISTENT WITH PREVIOUSLY REPORTED AND CALLED VALUE. Performed at [facility], [address] [place], [place] [address]    Culture GRAM POSITIVE COCCI  Final   Report Status PENDING  Incomplete  Blood Cultures x 2 sites     Status: None (Preliminary result)   Collection Time: 10/08/23  7:43 PM   Specimen: BLOOD RIGHT FOREARM  Result Value Ref Range Status   Specimen Description BLOOD RIGHT FOREARM  Final   Special Requests   Final    BOTTLES DRAWN AEROBIC AND ANAEROBIC Blood Culture adequate volume   Culture  Setup Time   Final    GRAM POSITIVE COCCI IN CLUSTERS IN BOTH AEROBIC AND ANAEROBIC BOTTLES CRITICAL  RESULT CALLED TO, READ BACK BY AND VERIFIED WITH: Carmie Chough on 052925 @0900  by SM Performed at [facility], [address] [place], [place] [address]    Culture GRAM POSITIVE COCCI IN CLUSTERS  Final   Report Status PENDING  Incomplete  Blood Culture ID Panel (Reflexed)     Status: Abnormal   Collection Time: 10/08/23  7:43 PM  Result Value Ref Range Status   Enterococcus faecalis NOT DETECTED NOT DETECTED Final   Enterococcus Faecium NOT DETECTED NOT DETECTED Final   Listeria monocytogenes NOT DETECTED NOT DETECTED Final   Staphylococcus species DETECTED (A) NOT DETECTED Final    Comment: CRITICAL RESULT CALLED TO, READ BACK BY AND VERIFIED WITH: Carmie Chough on 052925 @0900  by SM    Staphylococcus aureus (BCID) DETECTED (A) NOT DETECTED Final    Comment: Methicillin (oxacillin)-resistant Staphylococcus aureus (MRSA). MRSA is predictably resistant to beta-lactam antibiotics (except ceftaroline). Preferred therapy is vancomycin  unless clinically contraindicated. Patient requires contact precautions if  hospitalized. CRITICAL RESULT CALLED TO, READ BACK BY AND VERIFIED WITH: Carmie Chough on [phone] @0900  by SM    Staphylococcus epidermidis NOT DETECTED NOT DETECTED Final   Staphylococcus lugdunensis NOT DETECTED NOT DETECTED Final   Streptococcus species NOT DETECTED NOT DETECTED Final   Streptococcus agalactiae NOT DETECTED NOT DETECTED Final   Streptococcus pneumoniae NOT DETECTED NOT DETECTED Final   Streptococcus pyogenes NOT DETECTED NOT DETECTED Final   A.calcoaceticus-baumannii NOT DETECTED NOT DETECTED Final   Bacteroides fragilis NOT DETECTED NOT DETECTED Final   Enterobacterales NOT DETECTED NOT DETECTED Final   Enterobacter cloacae complex NOT DETECTED NOT DETECTED Final   Escherichia coli NOT DETECTED NOT DETECTED Final   Klebsiella aerogenes NOT DETECTED NOT DETECTED Final   Klebsiella oxytoca NOT DETECTED NOT DETECTED Final   Klebsiella pneumoniae NOT DETECTED  NOT DETECTED Final   Proteus species NOT DETECTED NOT DETECTED Final   Salmonella species NOT DETECTED NOT DETECTED Final   Serratia marcescens NOT DETECTED NOT DETECTED Final   Haemophilus influenzae NOT DETECTED NOT DETECTED Final   Neisseria meningitidis NOT DETECTED NOT DETECTED Final   Pseudomonas aeruginosa NOT DETECTED NOT DETECTED Final   Stenotrophomonas maltophilia NOT DETECTED NOT DETECTED Final   Candida albicans NOT DETECTED NOT DETECTED Final   Candida auris NOT DETECTED NOT DETECTED Final   Candida glabrata NOT DETECTED NOT DETECTED Final   Candida krusei NOT DETECTED NOT DETECTED Final   Candida parapsilosis NOT DETECTED NOT DETECTED  Final   Candida tropicalis NOT DETECTED NOT DETECTED Final   Cryptococcus neoformans/gattii NOT DETECTED NOT DETECTED Final   Meth resistant mecA/C and MREJ DETECTED (A) NOT DETECTED Final    Comment: CRITICAL RESULT CALLED TO, READ BACK BY AND VERIFIED WITH: Carmie Chough on [id number] @0900  by SM Performed at [facility], [address] [place], [place] [address]   Surgical pcr screen     Status: Abnormal   Collection Time: 10/09/23  2:22 AM   Specimen: Nasal Mucosa; Nasal Swab  Result Value Ref Range Status   MRSA, PCR POSITIVE (A) NEGATIVE Final    Comment: RESULT CALLED TO, READ BACK BY AND VERIFIED WITH: P BROWN RN 10/09/2023 @ 0526 BY AB    Staphylococcus aureus POSITIVE (A) NEGATIVE Final    Comment: (NOTE) The Xpert SA Assay (FDA approved for NASAL specimens in patients 21 years of age and older), is one component of a comprehensive surveillance program. It is not intended to diagnose infection nor to guide or monitor treatment. Performed at [facility], [address], [place], [place] [address]      Imaging: Reviewed     10/08/23 ct abd pelv without contrast 1. Hydropic gallbladder with common bile duct dilatation-limited evaluation on this noncontrast study. Correlate with liver function tests and  consider right upper quadrant ultrasound. 2. Hepatomegaly. 3. Limited evaluation on this noncontrast study.   5/29 lspine xry No acute abnormality noted.   5/29 cxr New patchy/nodular densities clustered in the right lower lung. Imaging features are nonspecific and could be infectious/inflammatory. Follow-up chest radiograph recommended to ensure resolution.  5/29 ruq us  1. Echogenic material in the gallbladder, possible sludge with mild gallbladder wall thickening. No sonographic Abigail Abler sign is see to suggest acute cholecystitis. 2. Distended common bile duct measuring 14.8 mm, minimally increased from 2021 and may be chronic. 3. Increased renal echogenicity on the right, suggesting medical renal disease.  Jamesetta Mcbride, MD [facility] [phone]  pager   [phone] cell 10/09/2023, 12:28 PM

## 2023-10-09 NOTE — H&P (View-Only) (Signed)
 HAND SURGERY CONSULTATION  REQUESTING PHYSICIAN: Lesa Rape, MD   Chief Complaint: Left ring finger pain  HPI: Alvin Daniels is a 53 y.o. male with a history of ESRD on dialysis, bilateral BKA, uncontrolled diabetes, HIV, chronic hepatitis C who presents with swelling, erythema, pain, and drainage from his left ring finger.  He denies any injury to this finger.  The left ring finger erythema and drainage has been present for several weeks now.  Patient is a fairly poor historian regarding this issue.  He has been admitted to the medicine service for this issue as well as nausea, vomiting, and severe abdominal pain.   Past Medical History:  Diagnosis Date   Acute bilateral low back pain 01/03/2021   AIDS ([name]) 11/22/2014   Back pain 06/09/2019   BKA stump complication ([name]) 06/09/2019   Chronic diarrhea    Chronic hepatitis C without hepatic coma ([name]) 11/22/2014   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[name])    Right   Corneal ulcer 12/18/2022   Descemetocele of left eye 12/18/2022   Diabetic neuropathy ([name])    ESRD (end stage renal disease) on dialysis ([name])    "TTS; don't remember street name" (05/03/2014)   Hepatitis C    HIV INFECTION 06/27/2010   Qualifier: Diagnosis of  By: Oretha Birch CMA ( AAMA), Jacqueline     Hypotension 06/02/2012   Metabolic bone disease 06/18/2012   MRSA infection    Normocytic anemia 06/17/2012   Pancreatitis    Pressure ulcer of BKA stump, stage 2 ([name]) 11/22/2014   Severe protein-calorie malnutrition ([name]) 06/17/2012   Uncontrolled diabetes mellitus with complications 06/27/2010   Annotation: uncontrolled Qualifier: Diagnosis of  By: Oretha Birch CMA Nonie Beady), Jacqueline     Vaccine counseling 05/29/2022   Past Surgical History:  Procedure Laterality Date   AMPUTATION Left 04/20/2014   Procedure: 3rd toe amputation, 4th Toe Amputation,  5th Toe Amputation;  Surgeon: [name], MD;  Location: [facility] OR;  Service: Orthopedics;   Laterality: Left;   AMPUTATION Left 05/02/2014   Procedure: Midfoot Amputation;  Surgeon: [name], MD;  Location: [facility] OR;  Service: Orthopedics;  Laterality: Left;   AMPUTATION Left 06/17/2014   Procedure: AMPUTATION BELOW KNEE;  Surgeon: [name], MD;  Location: [facility] OR;  Service: Orthopedics;  Laterality: Left;   AMPUTATION Right 09/04/2018   Procedure: RIGHT BELOW KNEE AMPUTATION;  Surgeon: [name], MD;  Location: [facility] OR;  Service: Orthopedics;  Laterality: Right;  RIGHT BELOW KNEE AMPUTATION   AV FISTULA PLACEMENT Left    AV FISTULA PLACEMENT Left 05/10/2016   Procedure: Creation Left Arm Brachiocephalic Arteriovenous Fistula and Ligation of Radiocephalic Fistula;  Surgeon: [name], MD;  Location: [facility] OR;  Service: Vascular;  Laterality: Left;   DIALYSIS/PERMA CATHETER INSERTION N/A 09/01/2023   Procedure: DIALYSIS/PERMA CATHETER INSERTION;  Surgeon: Patrick Boor, MD;  Location: [facility] INVASIVE CV LAB;  Service: Cardiovascular;  Laterality: N/A;   FEMUR IM NAIL Left 08/17/2016   Procedure: INTRAMEDULLARY (IM) RETROGRADE FEMORAL NAILING;  Surgeon: Adah Acron, MD;  Location: [facility] OR;  Service: Orthopedics;  Laterality: Left;   FOOT AMPUTATION THROUGH ANKLE Left 12/'21/2015   midfoot   IR AV DIALY SHUNT INTRO NEEDLE/INTRACATH INITIAL W/PTA/IMG LEFT  04/17/2023   IR GENERIC HISTORICAL Left 05/01/2016   IR THROMBECTOMY AV FISTULA W/THROMBOLYSIS/PTA INC/SHUNT/IMG LEFT 05/01/2016 [name], MD [facility]   IR GENERIC HISTORICAL  05/01/2016   IR US  GUIDE VASC ACCESS LEFT 05/01/2016 [name],  MD [facility]   IR GENERIC HISTORICAL  05/07/2016   IR FLUORO GUIDE CV LINE RIGHT 05/07/2016 [name], DO [facility]   IR GENERIC HISTORICAL  05/07/2016   IR US  GUIDE VASC ACCESS RIGHT 05/07/2016 [name], DO [facility]   IR GENERIC HISTORICAL  05/22/2016   IR US  GUIDE VASC ACCESS RIGHT 05/22/2016 [name], MD [facility]   IR GENERIC HISTORICAL   05/22/2016   IR FLUORO GUIDE CV LINE RIGHT 05/22/2016 [name], MD [facility]   IR REMOVAL TUN CV CATH W/O [facility]  08/21/2016   IR US  GUIDE VASC ACCESS LEFT  04/17/2023   PERIPHERAL VASCULAR CATHETERIZATION Left 05/09/2016   Procedure: A/V Fistulagram;  Surgeon: [name], MD;  Location: [facility] INVASIVE CV LAB;  Service: Cardiovascular;  Laterality: Left;  arm   TEE WITHOUT CARDIOVERSION N/A 06/22/2018   Procedure: TRANSESOPHAGEAL ECHOCARDIOGRAM (TEE);  Surgeon: [name], MD;  Location: [facility] ENDOSCOPY;  Service: Cardiovascular;  Laterality: N/A;   Social History   Socioeconomic History   Marital status: Single    Spouse name: Not on file   Number of children: Not on file   Years of education: Not on file   Highest education level: Not on file  Occupational History   Not on file  Tobacco Use   Smoking status: Never   Smokeless tobacco: Never  Vaping Use   Vaping status: Never Used  Substance and Sexual Activity   Alcohol  use: No   Drug use: No   Sexual activity: Not Currently    Comment: declined condoms  Other Topics Concern   Not on file  Social History Narrative   ** Merged History Encounter **       Social Drivers of Health   Financial Resource Strain: Low Risk  (01/29/2023)   Overall Financial Resource Strain (CARDIA)    Difficulty of Paying Living Expenses: Not very hard  Food Insecurity: No Food Insecurity (10/08/2023)   Hunger Vital Sign    Worried About Running Out of Food in the Last Year: Never true    Ran Out of Food in the Last Year: Never true  Transportation Needs: No Transportation Needs (10/08/2023)   PRAPARE - Administrator, Civil Service (Medical): No    Lack of Transportation (Non-Medical): No  Physical Activity: Inactive (01/29/2023)   Exercise Vital Sign    Days of Exercise per Week: 0 days    Minutes of Exercise per Session: 0 min  Stress: No Stress Concern Present (01/29/2023)   [employer] of Occupational Health -  Occupational Stress Questionnaire    Feeling of Stress : Not at all  Social Connections: Moderately Isolated (10/08/2023)   Social Connection and Isolation Panel [NHANES]    Frequency of Communication with Friends and Family: More than three times a week    Frequency of Social Gatherings with Friends and Family: More than three times a week    Attends Religious Services: More than 4 times per year    Active Member of [employer] or Organizations: No    Attends Engineer, structural: Never    Marital Status: Never married   Family History  Problem Relation Age of Onset   Diabetes Mother    Diabetes Father    - negative except otherwise stated in the family history section No Known Allergies Prior to Admission medications   Medication Sig Start Date End Date Taking? Authorizing Provider  bictegravir-emtricitabine -tenofovir  AF (BIKTARVY ) 50-200-25 MG TABS tablet Take  1 tablet by mouth daily. 04/16/23   [name], MD  insulin  aspart protamine  - aspart (NOVOLOG  70/30 FLEXPEN) (70-30) 100 UNIT/ML FlexPen Inject 20 Units into the skin 2 (two) times daily. Patient taking differently: Inject 15 Units into the skin 2 (two) times daily. 07/17/23   [name], MD  midodrine  (PROAMATINE ) 10 MG tablet Take 1 tablet (10 mg total) by mouth 3 (three) times daily as needed for low blood pressure, SBP</=100. may take 30 min prior to dialysis and mid treatment as needed. 09/05/23     bictegravir-emtricitabine -tenofovir  AF (BIKTARVY ) 50-200-25 MG TABS tablet Take 1 tablet by mouth daily. 01/02/22   [name], MD   DG Lumbar Spine 2-3 Views Result Date: 10/09/2023 CLINICAL DATA:  Low back pain radiating into the legs. EXAM: LUMBAR SPINE - 3 VIEW COMPARISON:  None Available. FINDINGS: Five lumbar type vertebral bodies are well visualized. Vertebral body height is well maintained. No anterolisthesis is seen. Extensive arterial calcifications are noted. No acute soft tissue abnormality is  seen. IMPRESSION: No acute abnormality noted. Electronically Signed   By: Violeta Grey M.D.   On: 10/09/2023 09:40   DG CHEST PORT 1 VIEW Result Date: 10/09/2023 CLINICAL DATA:  Dyspnea. EXAM: PORTABLE CHEST 1 VIEW COMPARISON:  08/01/2022 FINDINGS: The cardiopericardial silhouette is within normal limits for size. New patchy/nodular densities are seen clustered in the right lower lung. Stable streaky opacity at the left base consistent with chronic atelectasis or scarring. No pulmonary edema or pleural effusion. Right IJ central line tip overlies the right atrium. Telemetry leads overlie the chest. IMPRESSION: New patchy/nodular densities clustered in the right lower lung. Imaging features are nonspecific and could be infectious/inflammatory. Follow-up chest radiograph recommended to ensure resolution. Electronically Signed   By: Donnal Fusi M.D.   On: 10/09/2023 08:05   US  Abdomen Limited RUQ (LIVER/GB) Result Date: 10/08/2023 CLINICAL DATA:  Epigastric pain. EXAM: ULTRASOUND ABDOMEN LIMITED RIGHT UPPER QUADRANT COMPARISON:  10/08/2023, 09/28/2019 at. FINDINGS: Gallbladder: Echogenic material is present in the dependent portion of the gallbladder. There is mild gallbladder wall thickening at 4.6 mm. No sonographic Murphy sign noted by sonographer. Common bile duct: Diameter: 14.8 mm Liver: No focal lesion identified. Within normal limits in parenchymal echogenicity. Portal vein is patent on color Doppler imaging with normal direction of blood flow towards the liver. Other: Shadowing calcifications in the right kidney are noted, compatible with no vascular calcifications. Increased renal parenchymal echogenicity is noted. IMPRESSION: 1. Echogenic material in the gallbladder, possible sludge with mild gallbladder wall thickening. No sonographic Abigail Abler sign is see to suggest acute cholecystitis. 2. Distended common bile duct measuring 14.8 mm, minimally increased from 2021 and may be chronic. 3. Increased  renal echogenicity on the right, suggesting medical renal disease. Electronically Signed   By: Wyvonnia Heimlich M.D.   On: 10/08/2023 20:41   CT ABDOMEN PELVIS WO CONTRAST Result Date: 10/08/2023 CLINICAL DATA:  LLQ abdominal pain RLQ abdominal pain EXAM: CT ABDOMEN AND PELVIS WITHOUT CONTRAST TECHNIQUE: Multidetector CT imaging of the abdomen and pelvis was performed following the standard protocol without IV contrast. RADIATION DOSE REDUCTION: This exam was performed according to the departmental dose-optimization program which includes automated exposure control, adjustment of the mA and/or kV according to patient size and/or use of iterative reconstruction technique. COMPARISON:  CT abdomen pelvis 09/27/2019, ultrasound abdomen 09/28/2019 FINDINGS: Lower chest: No acute abnormality. Similar-appearing bilateral lower lobe atelectasis versus scarring. Hepatobiliary: Liver is enlarged measuring up to 20 cm. No  focal liver abnormality. No gallstones, gallbladder wall thickening, or pericholecystic fluid. Common bile duct dilatation measuring up to 14 mm. Hydropic gallbladder. No definite intrahepatic biliary ductal dilatation. Pancreas: No focal lesion. Normal pancreatic contour. No surrounding inflammatory changes. No main pancreatic ductal dilatation. Spleen: Normal in size without focal abnormality. Adrenals/Urinary Tract: No adrenal nodule bilaterally. No nephrolithiasis and no hydronephrosis. No definite contour-deforming renal mass. No ureterolithiasis or hydroureter. The urinary bladder is unremarkable. Stomach/Bowel: Stomach is within normal limits. No evidence of bowel wall thickening or dilatation. Prominent rectosigmoid wall likely due to under distension. Under distended bowel. Appendix appears normal. Vascular/Lymphatic: No abdominal aorta or iliac aneurysm. Moderate to severe atherosclerotic plaque of the aorta and its branches. No abdominal, pelvic, or inguinal lymphadenopathy. Reproductive: Prostate  is unremarkable. Other: No intraperitoneal free fluid. No intraperitoneal free gas. No organized fluid collection. Musculoskeletal: No abdominal wall hernia or abnormality. No suspicious lytic or blastic osseous lesions. No acute displaced fracture. Multilevel degenerative changes of the spine. IMPRESSION: 1. Hydropic gallbladder with common bile duct dilatation-limited evaluation on this noncontrast study. Correlate with liver function tests and consider right upper quadrant ultrasound. 2. Hepatomegaly. 3. Limited evaluation on this noncontrast study. Electronically Signed   By: Morgane  Naveau M.D.   On: 10/08/2023 19:23   DG Hand Complete Left Result Date: 10/08/2023 CLINICAL DATA:  L ring finger infection / pain EXAM: LEFT HAND - COMPLETE 3+ VIEW COMPARISON:  None Available. FINDINGS: Osteopenia. Extensive peripheral vascular atherosclerosis. Bony cortical destruction of the fourth digit involving the distal phalanx and the distal segment of the middle phalanx. Soft tissue swelling fourth phalanx. IMPRESSION: Soft tissue swelling throughout the fourth phalanx. Bony cortical destruction involving the fourth mid and distal phalanges, most consistent with osteomyelitis. Electronically Signed   By: Rance Burrows M.D.   On: 10/08/2023 18:49   - Positive ROS: All other systems have been reviewed and were otherwise negative with the exception of those mentioned in the HPI and as above.  Physical Exam: General: No acute distress, resting comfortably Cardiovascular: BUE warm and well perfused, normal rate Respiratory: Normal WOB on RA Skin: Warm and dry Neurologic: Sensation intact distally Psychiatric: Patient is at baseline mood and affect  Left upper Extremity  Welling and erythema of the ring finger starting just distal to the PIP joint.  There is purulent drainage from the fingertip with obvious bony destruction with exposed distal phalanx.  The the wound is quite foul-smelling.  He has no other  wounds in this hand.  He has limited active range of motion of his fingers at baseline.  He has subjective intact sensation in the median, ulnar, radial nerve distributions.  All fingers, with the exception of the ring finger, are warm and well-perfused with brisk capillary refill.   Assessment: 53 year old male with above complex past medical history with left ring finger infection.  X-rays in the ER suggest osteomyelitis of the ring finger middle and distal phalanges.  Plan: I reviewed the nature of this infection with the patient at length.  I have recommended amputation of the ring finger, likely at the PIP joint, for source control.  Given patient's history of uncontrolled diabetes and end-stage renal disease, I do not think that he would be able to heal any wounds from irrigation and debridement for attempted salvage of this finger.  We reviewed the nature of the surgery at length.  We discussed that the risks of surgery include bleeding, persistent infection, sensitivity at the amputation site, delayed wound healing,  need for additional surgery.  We reviewed the expected postoperative course and recovery process.  Questions were encouraged and answered.  Informed consent was signed.  Thank you for the consult and the opportunity to see Alvin Daniels, M.D. [facility] 12:33 PM

## 2023-10-09 NOTE — Anesthesia Preprocedure Evaluation (Addendum)
 Anesthesia Evaluation  Patient identified by MRN, date of birth, ID band Patient awake    Reviewed: Allergy & Precautions, NPO status , Patient's Chart, lab work & pertinent test results, reviewed documented beta blocker date and time   History of Anesthesia Complications Negative for: history of anesthetic complications  Airway Mallampati: II  TM Distance: >3 FB Neck ROM: Full    Dental  (+) Dental Advisory Given, Poor Dentition,    Pulmonary neg pulmonary ROS, shortness of breath and with exertion   Pulmonary exam normal breath sounds clear to auscultation       Cardiovascular + Peripheral Vascular Disease  negative cardio ROS Normal cardiovascular exam Rhythm:Regular Rate:Normal     Neuro/Psych  Headaches Diabetic polyneuropathy  Neuromuscular disease negative neurological ROS  negative psych ROS   GI/Hepatic ,GERD  Medicated,,(+) Hepatitis -, C  Endo/Other  diabetes, Poorly Controlled, Type 2, Insulin  Dependent    Renal/GU ESRF and DialysisRenal diseaseLast dialysis yesterday  negative genitourinary   Musculoskeletal negative musculoskeletal ROS (+) Arthritis , Osteoarthritis,  Osteomyelitis left ring finger S/P left BKA   Abdominal   Peds  Hematology  (+) Blood dyscrasia, anemia , HIV  Anesthesia Other Findings Day of surgery medications reviewed with the patient.  Reproductive/Obstetrics                             Anesthesia Physical Anesthesia Plan  ASA: 4  Anesthesia Plan: General   Post-op Pain Management: Minimal or no pain anticipated   Induction: Intravenous  PONV Risk Score and Plan: 2 and Treatment may vary due to age or medical condition, Propofol  infusion and Ondansetron   Airway Management Planned: LMA  Additional Equipment: None  Intra-op Plan:   Post-operative Plan: Extubation in OR  Informed Consent: I have reviewed the patients History and Physical,  chart, labs and discussed the procedure including the risks, benefits and alternatives for the proposed anesthesia with the patient or authorized representative who has indicated his/her understanding and acceptance.     Dental advisory given and Interpreter used for interview  Plan Discussed with: CRNA  Anesthesia Plan Comments:         Anesthesia Quick Evaluation

## 2023-10-09 NOTE — Consult Note (Signed)
 HAND SURGERY CONSULTATION  REQUESTING PHYSICIAN: Lesa Rape, MD   Chief Complaint: Left ring finger pain  HPI: Alvin Daniels is a 53 y.o. male with a history of ESRD on dialysis, bilateral BKA, uncontrolled diabetes, HIV, chronic hepatitis C who presents with swelling, erythema, pain, and drainage from his left ring finger.  He denies any injury to this finger.  The left ring finger erythema and drainage has been present for several weeks now.  Patient is a fairly poor historian regarding this issue.  He has been admitted to the medicine service for this issue as well as nausea, vomiting, and severe abdominal pain.   Past Medical History:  Diagnosis Date   Acute bilateral low back pain 01/03/2021   AIDS ([name]) 11/22/2014   Back pain 06/09/2019   BKA stump complication ([name]) 06/09/2019   Chronic diarrhea    Chronic hepatitis C without hepatic coma ([name]) 11/22/2014   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[name])    Right   Corneal ulcer 12/18/2022   Descemetocele of left eye 12/18/2022   Diabetic neuropathy ([name])    ESRD (end stage renal disease) on dialysis ([name])    "TTS; don't remember street name" (05/03/2014)   Hepatitis C    HIV INFECTION 06/27/2010   Qualifier: Diagnosis of  By: Oretha Birch CMA ( AAMA), Jacqueline     Hypotension 06/02/2012   Metabolic bone disease 06/18/2012   MRSA infection    Normocytic anemia 06/17/2012   Pancreatitis    Pressure ulcer of BKA stump, stage 2 ([name]) 11/22/2014   Severe protein-calorie malnutrition ([name]) 06/17/2012   Uncontrolled diabetes mellitus with complications 06/27/2010   Annotation: uncontrolled Qualifier: Diagnosis of  By: Oretha Birch CMA Nonie Beady), Jacqueline     Vaccine counseling 05/29/2022   Past Surgical History:  Procedure Laterality Date   AMPUTATION Left 04/20/2014   Procedure: 3rd toe amputation, 4th Toe Amputation,  5th Toe Amputation;  Surgeon: [name], MD;  Location: [facility] OR;  Service: Orthopedics;   Laterality: Left;   AMPUTATION Left 05/02/2014   Procedure: Midfoot Amputation;  Surgeon: [name], MD;  Location: [facility] OR;  Service: Orthopedics;  Laterality: Left;   AMPUTATION Left 06/17/2014   Procedure: AMPUTATION BELOW KNEE;  Surgeon: [name], MD;  Location: [facility] OR;  Service: Orthopedics;  Laterality: Left;   AMPUTATION Right 09/04/2018   Procedure: RIGHT BELOW KNEE AMPUTATION;  Surgeon: [name], MD;  Location: [facility] OR;  Service: Orthopedics;  Laterality: Right;  RIGHT BELOW KNEE AMPUTATION   AV FISTULA PLACEMENT Left    AV FISTULA PLACEMENT Left 05/10/2016   Procedure: Creation Left Arm Brachiocephalic Arteriovenous Fistula and Ligation of Radiocephalic Fistula;  Surgeon: [name], MD;  Location: [facility] OR;  Service: Vascular;  Laterality: Left;   DIALYSIS/PERMA CATHETER INSERTION N/A 09/01/2023   Procedure: DIALYSIS/PERMA CATHETER INSERTION;  Surgeon: Patrick Boor, MD;  Location: [facility] INVASIVE CV LAB;  Service: Cardiovascular;  Laterality: N/A;   FEMUR IM NAIL Left 08/17/2016   Procedure: INTRAMEDULLARY (IM) RETROGRADE FEMORAL NAILING;  Surgeon: Adah Acron, MD;  Location: [facility] OR;  Service: Orthopedics;  Laterality: Left;   FOOT AMPUTATION THROUGH ANKLE Left 12/'21/2015   midfoot   IR AV DIALY SHUNT INTRO NEEDLE/INTRACATH INITIAL W/PTA/IMG LEFT  04/17/2023   IR GENERIC HISTORICAL Left 05/01/2016   IR THROMBECTOMY AV FISTULA W/THROMBOLYSIS/PTA INC/SHUNT/IMG LEFT 05/01/2016 [name], MD [facility]   IR GENERIC HISTORICAL  05/01/2016   IR US  GUIDE VASC ACCESS LEFT 05/01/2016 [name],  MD [facility]   IR GENERIC HISTORICAL  05/07/2016   IR FLUORO GUIDE CV LINE RIGHT 05/07/2016 [name], DO [facility]   IR GENERIC HISTORICAL  05/07/2016   IR US  GUIDE VASC ACCESS RIGHT 05/07/2016 [name], DO [facility]   IR GENERIC HISTORICAL  05/22/2016   IR US  GUIDE VASC ACCESS RIGHT 05/22/2016 [name], MD [facility]   IR GENERIC HISTORICAL   05/22/2016   IR FLUORO GUIDE CV LINE RIGHT 05/22/2016 [name], MD [facility]   IR REMOVAL TUN CV CATH W/O [facility]  08/21/2016   IR US  GUIDE VASC ACCESS LEFT  04/17/2023   PERIPHERAL VASCULAR CATHETERIZATION Left 05/09/2016   Procedure: A/V Fistulagram;  Surgeon: [name], MD;  Location: [facility] INVASIVE CV LAB;  Service: Cardiovascular;  Laterality: Left;  arm   TEE WITHOUT CARDIOVERSION N/A 06/22/2018   Procedure: TRANSESOPHAGEAL ECHOCARDIOGRAM (TEE);  Surgeon: [name], MD;  Location: [facility] ENDOSCOPY;  Service: Cardiovascular;  Laterality: N/A;   Social History   Socioeconomic History   Marital status: Single    Spouse name: Not on file   Number of children: Not on file   Years of education: Not on file   Highest education level: Not on file  Occupational History   Not on file  Tobacco Use   Smoking status: Never   Smokeless tobacco: Never  Vaping Use   Vaping status: Never Used  Substance and Sexual Activity   Alcohol  use: No   Drug use: No   Sexual activity: Not Currently    Comment: declined condoms  Other Topics Concern   Not on file  Social History Narrative   ** Merged History Encounter **       Social Drivers of Health   Financial Resource Strain: Low Risk  (01/29/2023)   Overall Financial Resource Strain (CARDIA)    Difficulty of Paying Living Expenses: Not very hard  Food Insecurity: No Food Insecurity (10/08/2023)   Hunger Vital Sign    Worried About Running Out of Food in the Last Year: Never true    Ran Out of Food in the Last Year: Never true  Transportation Needs: No Transportation Needs (10/08/2023)   PRAPARE - Administrator, Civil Service (Medical): No    Lack of Transportation (Non-Medical): No  Physical Activity: Inactive (01/29/2023)   Exercise Vital Sign    Days of Exercise per Week: 0 days    Minutes of Exercise per Session: 0 min  Stress: No Stress Concern Present (01/29/2023)   [employer] of Occupational Health -  Occupational Stress Questionnaire    Feeling of Stress : Not at all  Social Connections: Moderately Isolated (10/08/2023)   Social Connection and Isolation Panel [NHANES]    Frequency of Communication with Friends and Family: More than three times a week    Frequency of Social Gatherings with Friends and Family: More than three times a week    Attends Religious Services: More than 4 times per year    Active Member of [employer] or Organizations: No    Attends Engineer, structural: Never    Marital Status: Never married   Family History  Problem Relation Age of Onset   Diabetes Mother    Diabetes Father    - negative except otherwise stated in the family history section No Known Allergies Prior to Admission medications   Medication Sig Start Date End Date Taking? Authorizing Provider  bictegravir-emtricitabine -tenofovir  AF (BIKTARVY ) 50-200-25 MG TABS tablet Take  1 tablet by mouth daily. 04/16/23   [name], MD  insulin  aspart protamine  - aspart (NOVOLOG  70/30 FLEXPEN) (70-30) 100 UNIT/ML FlexPen Inject 20 Units into the skin 2 (two) times daily. Patient taking differently: Inject 15 Units into the skin 2 (two) times daily. 07/17/23   [name], MD  midodrine  (PROAMATINE ) 10 MG tablet Take 1 tablet (10 mg total) by mouth 3 (three) times daily as needed for low blood pressure, SBP</=100. may take 30 min prior to dialysis and mid treatment as needed. 09/05/23     bictegravir-emtricitabine -tenofovir  AF (BIKTARVY ) 50-200-25 MG TABS tablet Take 1 tablet by mouth daily. 01/02/22   [name], MD   DG Lumbar Spine 2-3 Views Result Date: 10/09/2023 CLINICAL DATA:  Low back pain radiating into the legs. EXAM: LUMBAR SPINE - 3 VIEW COMPARISON:  None Available. FINDINGS: Five lumbar type vertebral bodies are well visualized. Vertebral body height is well maintained. No anterolisthesis is seen. Extensive arterial calcifications are noted. No acute soft tissue abnormality is  seen. IMPRESSION: No acute abnormality noted. Electronically Signed   By: Violeta Grey M.D.   On: 10/09/2023 09:40   DG CHEST PORT 1 VIEW Result Date: 10/09/2023 CLINICAL DATA:  Dyspnea. EXAM: PORTABLE CHEST 1 VIEW COMPARISON:  08/01/2022 FINDINGS: The cardiopericardial silhouette is within normal limits for size. New patchy/nodular densities are seen clustered in the right lower lung. Stable streaky opacity at the left base consistent with chronic atelectasis or scarring. No pulmonary edema or pleural effusion. Right IJ central line tip overlies the right atrium. Telemetry leads overlie the chest. IMPRESSION: New patchy/nodular densities clustered in the right lower lung. Imaging features are nonspecific and could be infectious/inflammatory. Follow-up chest radiograph recommended to ensure resolution. Electronically Signed   By: Donnal Fusi M.D.   On: 10/09/2023 08:05   US  Abdomen Limited RUQ (LIVER/GB) Result Date: 10/08/2023 CLINICAL DATA:  Epigastric pain. EXAM: ULTRASOUND ABDOMEN LIMITED RIGHT UPPER QUADRANT COMPARISON:  10/08/2023, 09/28/2019 at. FINDINGS: Gallbladder: Echogenic material is present in the dependent portion of the gallbladder. There is mild gallbladder wall thickening at 4.6 mm. No sonographic Murphy sign noted by sonographer. Common bile duct: Diameter: 14.8 mm Liver: No focal lesion identified. Within normal limits in parenchymal echogenicity. Portal vein is patent on color Doppler imaging with normal direction of blood flow towards the liver. Other: Shadowing calcifications in the right kidney are noted, compatible with no vascular calcifications. Increased renal parenchymal echogenicity is noted. IMPRESSION: 1. Echogenic material in the gallbladder, possible sludge with mild gallbladder wall thickening. No sonographic Abigail Abler sign is see to suggest acute cholecystitis. 2. Distended common bile duct measuring 14.8 mm, minimally increased from 2021 and may be chronic. 3. Increased  renal echogenicity on the right, suggesting medical renal disease. Electronically Signed   By: Wyvonnia Heimlich M.D.   On: 10/08/2023 20:41   CT ABDOMEN PELVIS WO CONTRAST Result Date: 10/08/2023 CLINICAL DATA:  LLQ abdominal pain RLQ abdominal pain EXAM: CT ABDOMEN AND PELVIS WITHOUT CONTRAST TECHNIQUE: Multidetector CT imaging of the abdomen and pelvis was performed following the standard protocol without IV contrast. RADIATION DOSE REDUCTION: This exam was performed according to the departmental dose-optimization program which includes automated exposure control, adjustment of the mA and/or kV according to patient size and/or use of iterative reconstruction technique. COMPARISON:  CT abdomen pelvis 09/27/2019, ultrasound abdomen 09/28/2019 FINDINGS: Lower chest: No acute abnormality. Similar-appearing bilateral lower lobe atelectasis versus scarring. Hepatobiliary: Liver is enlarged measuring up to 20 cm. No  focal liver abnormality. No gallstones, gallbladder wall thickening, or pericholecystic fluid. Common bile duct dilatation measuring up to 14 mm. Hydropic gallbladder. No definite intrahepatic biliary ductal dilatation. Pancreas: No focal lesion. Normal pancreatic contour. No surrounding inflammatory changes. No main pancreatic ductal dilatation. Spleen: Normal in size without focal abnormality. Adrenals/Urinary Tract: No adrenal nodule bilaterally. No nephrolithiasis and no hydronephrosis. No definite contour-deforming renal mass. No ureterolithiasis or hydroureter. The urinary bladder is unremarkable. Stomach/Bowel: Stomach is within normal limits. No evidence of bowel wall thickening or dilatation. Prominent rectosigmoid wall likely due to under distension. Under distended bowel. Appendix appears normal. Vascular/Lymphatic: No abdominal aorta or iliac aneurysm. Moderate to severe atherosclerotic plaque of the aorta and its branches. No abdominal, pelvic, or inguinal lymphadenopathy. Reproductive: Prostate  is unremarkable. Other: No intraperitoneal free fluid. No intraperitoneal free gas. No organized fluid collection. Musculoskeletal: No abdominal wall hernia or abnormality. No suspicious lytic or blastic osseous lesions. No acute displaced fracture. Multilevel degenerative changes of the spine. IMPRESSION: 1. Hydropic gallbladder with common bile duct dilatation-limited evaluation on this noncontrast study. Correlate with liver function tests and consider right upper quadrant ultrasound. 2. Hepatomegaly. 3. Limited evaluation on this noncontrast study. Electronically Signed   By: Morgane  Naveau M.D.   On: 10/08/2023 19:23   DG Hand Complete Left Result Date: 10/08/2023 CLINICAL DATA:  L ring finger infection / pain EXAM: LEFT HAND - COMPLETE 3+ VIEW COMPARISON:  None Available. FINDINGS: Osteopenia. Extensive peripheral vascular atherosclerosis. Bony cortical destruction of the fourth digit involving the distal phalanx and the distal segment of the middle phalanx. Soft tissue swelling fourth phalanx. IMPRESSION: Soft tissue swelling throughout the fourth phalanx. Bony cortical destruction involving the fourth mid and distal phalanges, most consistent with osteomyelitis. Electronically Signed   By: Rance Burrows M.D.   On: 10/08/2023 18:49   - Positive ROS: All other systems have been reviewed and were otherwise negative with the exception of those mentioned in the HPI and as above.  Physical Exam: General: No acute distress, resting comfortably Cardiovascular: BUE warm and well perfused, normal rate Respiratory: Normal WOB on RA Skin: Warm and dry Neurologic: Sensation intact distally Psychiatric: Patient is at baseline mood and affect  Left upper Extremity  Welling and erythema of the ring finger starting just distal to the PIP joint.  There is purulent drainage from the fingertip with obvious bony destruction with exposed distal phalanx.  The the wound is quite foul-smelling.  He has no other  wounds in this hand.  He has limited active range of motion of his fingers at baseline.  He has subjective intact sensation in the median, ulnar, radial nerve distributions.  All fingers, with the exception of the ring finger, are warm and well-perfused with brisk capillary refill.   Assessment: 53 year old male with above complex past medical history with left ring finger infection.  X-rays in the ER suggest osteomyelitis of the ring finger middle and distal phalanges.  Plan: I reviewed the nature of this infection with the patient at length.  I have recommended amputation of the ring finger, likely at the PIP joint, for source control.  Given patient's history of uncontrolled diabetes and end-stage renal disease, I do not think that he would be able to heal any wounds from irrigation and debridement for attempted salvage of this finger.  We reviewed the nature of the surgery at length.  We discussed that the risks of surgery include bleeding, persistent infection, sensitivity at the amputation site, delayed wound healing,  need for additional surgery.  We reviewed the expected postoperative course and recovery process.  Questions were encouraged and answered.  Informed consent was signed.  Thank you for the consult and the opportunity to see Alvin Daniels, M.D. [facility] 12:33 PM

## 2023-10-09 NOTE — Progress Notes (Signed)
 PHARMACY - PHYSICIAN COMMUNICATION CRITICAL VALUE ALERT - BLOOD CULTURE IDENTIFICATION (BCID)  Alvin Daniels is an 53 y.o. male who presented to [facility] on 10/08/2023 with a chief complaint of fever, back pain, arm swelling and osteomyelitis of left fourth finger.   Assessment:  53 year old male - ESRD on HD and dialyzes through HD catheter. Admitted with left fourth finger osteomyelitis. Notable history of MRSA bacteremia in 2020/2021. Now with MRSA in 4/4 blood cultures.   Name of physician (or Provider) Contacted: Kc Vu ( ID Team)   Current antibiotics: Vanc/ceftriaxone   Changes to prescribed antibiotics recommended:  Continue Vanc. Consider D/C Ceftriaxone   Results for orders placed or performed during the hospital encounter of 10/08/23  Blood Culture ID Panel (Reflexed) (Collected: 10/08/2023  7:43 PM)  Result Value Ref Range   Enterococcus faecalis NOT DETECTED NOT DETECTED   Enterococcus Faecium NOT DETECTED NOT DETECTED   Listeria monocytogenes NOT DETECTED NOT DETECTED   Staphylococcus species DETECTED (A) NOT DETECTED   Staphylococcus aureus (BCID) DETECTED (A) NOT DETECTED   Staphylococcus epidermidis NOT DETECTED NOT DETECTED   Staphylococcus lugdunensis NOT DETECTED NOT DETECTED   Streptococcus species NOT DETECTED NOT DETECTED   Streptococcus agalactiae NOT DETECTED NOT DETECTED   Streptococcus pneumoniae NOT DETECTED NOT DETECTED   Streptococcus pyogenes NOT DETECTED NOT DETECTED   A.calcoaceticus-baumannii NOT DETECTED NOT DETECTED   Bacteroides fragilis NOT DETECTED NOT DETECTED   Enterobacterales NOT DETECTED NOT DETECTED   Enterobacter cloacae complex NOT DETECTED NOT DETECTED   Escherichia coli NOT DETECTED NOT DETECTED   Klebsiella aerogenes NOT DETECTED NOT DETECTED   Klebsiella oxytoca NOT DETECTED NOT DETECTED   Klebsiella pneumoniae NOT DETECTED NOT DETECTED   Proteus species NOT DETECTED NOT DETECTED   Salmonella species NOT  DETECTED NOT DETECTED   Serratia marcescens NOT DETECTED NOT DETECTED   Haemophilus influenzae NOT DETECTED NOT DETECTED   Neisseria meningitidis NOT DETECTED NOT DETECTED   Pseudomonas aeruginosa NOT DETECTED NOT DETECTED   Stenotrophomonas maltophilia NOT DETECTED NOT DETECTED   Candida albicans NOT DETECTED NOT DETECTED   Candida auris NOT DETECTED NOT DETECTED   Candida glabrata NOT DETECTED NOT DETECTED   Candida krusei NOT DETECTED NOT DETECTED   Candida parapsilosis NOT DETECTED NOT DETECTED   Candida tropicalis NOT DETECTED NOT DETECTED   Cryptococcus neoformans/gattii NOT DETECTED NOT DETECTED   Meth resistant mecA/C and MREJ DETECTED (A) NOT DETECTED    Denson Flake, PharmD, BCPS, BCIDP Infectious Diseases Clinical Pharmacist Phone: [phone] 10/09/2023  9:08 AM

## 2023-10-09 NOTE — Progress Notes (Signed)
 PROGRESS NOTE Alvin Daniels  RUE:[id number] DOB: February 28, 1971 DOA: 10/08/2023 PCP: [name], MD  Brief Narrative/Hospital Course: a 52 y.o. male with medical history significant for ESRD on HD MWF, uncontrolled type 2 diabetes, HIV, chronic hepatitis C, and s/p bilateral BKA who presented to the ED for evaluation of nausea/vomiting and cramping abdominal pain for a week feeling feverish since Saturday, having pain in his low back afebrile and lower abdomen and admitted for sepsis secondary to left fourth finger osteomyelitis. In the ED hypotensive 78/52, not hypoxic.  Labs showed hypokalemia 3.1, normal lactic acid 1.3> 0.9, leukocytosis 12 anemia 10.6 thrombocytopenia 87 MRSA PCR positive , elevated but flat troponin 53> 54 Blood culture sent,, Started on vancomycin  and ceftriaxone   Subjective: Seen examined C/o low back pain left shoulder pain and lt finger pain Blood pressure has been soft 84-1 03 systolic, afebrile overnight, saturating well on room air Labs this morning stable electrolytes bicarb 29 potassium 3.8  Assessment and plan:  Severe Sepsis POA 2/2 Osteomyelitis of the left fourth finger MRSA bacteremia 4/4 likely form above Chronic Hypotension Back pain/shoulder pain: Having 1 month of worsening L 4th finger. MRSa pcr + X-ray lt hand-osteomyelitis of the fourth mid and distal phalanges BP still soft, lactic acid normal. Hand surgery following, plan for amputation continue vancomycin  and ceftriaxone  follow-up culture data trend labs ID on board-adjust antibiotics.  X-ray of the low back no acute finding continue pain control Suspect he will need further advanced imaging soon.  He is able to move lower extremities with Nephrology aware he will need HD catheter removal along with line holiday  Chronic hypotension Continues to have low BP with SBP in the 90s to 100s, unclear baseline SBP cont prn midodrine   Elevated alk phos Gallbladder sludge: ALP  261, mildly elevated AST to 62 and bilirubin of 1.4 RUQ U/S shows some gallbladder sludge but no acute cholecystitis, CBD minimally increased from 2021 No RUQ pain.  Trend labs Will consider GI consult if no improvement   ESRD on HD MWF Anemia of chronic disease Mild hypokalemia Metabolic bone disease: Reports compliance with his HD schedule, had HD 5/28. Next HD due Friday- see line issues #1.   Type 2 diabetes with hyperglycemiaL Last A1c 10.0% on 07/17/2023, PTA on NovoLog  70/30 15 units twice daily at home Cont ssi q4hr for now Recent Labs  Lab 10/09/23 0004 10/09/23 0403  GLUCAP 100* 139*   HIV Last CD4 count 315, viral load 27 on 04/16/23. repeat lasb pending Continue Biktarvy    Bilateral BKA Uses prosthetics   DVT prophylaxis: heparin  injection 5,000 Units Start: 10/09/23 0030 Code Status:   Code Status: Full Code Family Communication: plan of care discussed with patient at bedside. Patient status is: Remains hospitalized because of severity of illness Level of care: Telemetry Medical   Dispo: The patient is from: home            Anticipated disposition: TBD Objective: Vitals last 24 hrs: Vitals:   10/08/23 2200 10/08/23 2247 10/09/23 0400 10/09/23 0827  BP: (!) 85/55 (!) 103/57 (!) 84/47 95/74  Pulse: 85 95 81 80  Resp: 17 17 17    Temp:  99.8 F (37.7 C) 98.8 F (37.1 C) 98.2 F (36.8 C)  TempSrc:   Oral Oral  SpO2: 92% 91% 93% 96%  Weight:      Height:        Physical Examination: General exam: alert awake, older than stated age HEENT:Oral mucosa moist, Ear/Nose WNL grossly Respiratory system:  Bilaterally clear BS, no use of accessory muscle, HD cath+ Cardiovascular system: S1 & S2 +. Gastrointestinal system: Abdomen soft,  NT,ND,BS+ Nervous System: Alert, awake, following commands.  Moving bilateral amputated legs Extremities: LE edema neg, warm extremities Skin: No rashes,warm. MSK: Normal muscle bulk/tone.   Data Reviewed: I have personally  reviewed following labs and imaging studies ( see epic result tab) CBC: Recent Labs  Lab 10/08/23 1634 10/09/23 0631  WBC 12.2* 12.2*  NEUTROABS 10.7*  --   HGB 10.6* 9.5*  HCT 31.2* 28.9*  MCV 97.2 99.0  PLT 87* 74*   CMP: Recent Labs  Lab 10/08/23 1634 10/08/23 1947 10/09/23 0631  NA 136  --  134*  K 3.1*  --  3.8  CL 95*  --  101  CO2 26  --  21*  GLUCOSE 79  --  149*  BUN 28*  --  33*  CREATININE 6.93*  --  7.78*  CALCIUM  8.9  --  8.5*  MG  --  2.1  --   PHOS  --   --  4.5   GFR: Estimated Creatinine Clearance: 7.5 mL/min (A) (by C-G formula based on SCr of 7.78 mg/dL (H)). Recent Labs  Lab 10/08/23 1634 10/09/23 0631  AST 52*  --   ALT 34  --   ALKPHOS 261*  --   BILITOT 1.4*  --   PROT 7.9  --   ALBUMIN  2.7* 2.3*    Recent Labs  Lab 10/08/23 1634  LIPASE 32   No results for input(s): "AMMONIA" in the last 168 hours. Coagulation Profile:  Recent Labs  Lab 10/08/23 1947  INR 1.2   Unresulted Labs (From admission, onward)     Start     Ordered   10/10/23 0500  Comprehensive metabolic panel with GFR  Daily,   R      10/09/23 0833   10/10/23 0500  CBC  Daily,   R      10/09/23 0833   10/09/23 0833  C Difficile Quick Screen w PCR reflex  (C Difficile quick screen w PCR reflex panel )  Once, for 24 hours,   TIMED       References:    CDiff Information Tool   10/09/23 0832   10/09/23 0500  T-helper cells (CD4) count (not at [facility])  Tomorrow morning,   R        10/09/23 0206   10/09/23 0500  HIV-1 RNA quant-no reflex-bld  Tomorrow morning,   R        10/09/23 0206   10/08/23 1634  Urinalysis, Routine w reflex microscopic -Urine, Clean Catch  (ED Abdominal Pain)  Once,   URGENT       Question:  Specimen Source  Answer:  Urine, Clean Catch   10/08/23 1633           Antimicrobials/Microbiology: Anti-infectives (From admission, onward)    Start     Dose/Rate Route Frequency Ordered Stop   10/10/23 1200  vancomycin  (VANCOCIN ) 500 mg in sodium  chloride 0.9 % 100 mL IVPB        500 mg 110 mL/hr over 60 Minutes Intravenous Every M-W-F (Hemodialysis) 10/09/23 0207     10/09/23 1000  bictegravir-emtricitabine -tenofovir  AF (BIKTARVY ) 50-200-25 MG per tablet 1 tablet        1 tablet Oral Daily 10/09/23 0028     10/09/23 1000  cefTRIAXone  (ROCEPHIN ) 2 g in sodium chloride  0.9 % 100 mL IVPB  2 g 200 mL/hr over 30 Minutes Intravenous Every 24 hours 10/09/23 0156     10/08/23 2000  vancomycin  (VANCOCIN ) IVPB 1000 mg/200 mL premix        1,000 mg 200 mL/hr over 60 Minutes Intravenous  Once 10/08/23 1958 10/08/23 2314   10/08/23 2000  piperacillin -tazobactam (ZOSYN ) IVPB 2.25 g        2.25 g 100 mL/hr over 30 Minutes Intravenous Once 10/08/23 1958 10/09/23 0851         Component Value Date/Time   SDES BLOOD RIGHT FOREARM 10/08/2023 1943   SPECREQUEST  10/08/2023 1943    BOTTLES DRAWN AEROBIC AND ANAEROBIC Blood Culture adequate volume   CULT GRAM POSITIVE COCCI IN CLUSTERS 10/08/2023 1943   REPTSTATUS PENDING 10/08/2023 1943    Procedures: Procedure(s) (LRB): AMPUTATION, FINGER (Left) Medications reviewed:  Scheduled Meds:  bictegravir-emtricitabine -tenofovir  AF  1 tablet Oral Daily   Chlorhexidine  Gluconate Cloth  6 each Topical Q0600   heparin   5,000 Units Subcutaneous Q8H   insulin  aspart  0-6 Units Subcutaneous Q4H   lidocaine   1 patch Transdermal Q24H   mupirocin  ointment  1 Application Nasal BID   Continuous Infusions:  cefTRIAXone  (ROCEPHIN )  IV 2 g (10/09/23 0950)   [START ON 10/10/2023] vancomycin  (VANCOCIN ) 500 mg in sodium chloride  0.9 % 100 mL IVPB      Lesa Rape, MD [facility] 10/09/2023, 10:23 AM

## 2023-10-09 NOTE — Hospital Course (Addendum)
 a 53 y.o. male with medical history significant for ESRD on HD MWF, uncontrolled type 2 diabetes, HIV, chronic hepatitis C, and s/p bilateral BKA who presented to the ED for evaluation of nausea/vomiting and cramping abdominal pain for a week feeling feverish since Saturday, having pain in his low back afebrile and lower abdomen and admitted for sepsis secondary to left fourth finger osteomyelitis. In the ED hypotensive 78/52, not hypoxic.  Labs showed hypokalemia 3.1, normal lactic acid 1.3> 0.9, leukocytosis 12 anemia 10.6 thrombocytopenia 87 MRSA PCR positive , elevated but flat troponin 53> 54 Blood culture sent,, Started on vancomycin  and ceftriaxone  Blood cx 4/4 MRSA- antibiotics changed to vanco alone. 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 6/1> echo showed vegetation on the tip of the catheter, HD catheter subsequently went by IR  Subjective: Patient seen and examined Still complains of some right hip pain Overnight events stable-no fever BP remains soft 80s-96 systolic Cbg ok 190s Npo for HD line today  Assessment and plan:  Severe Sepsis POA 2/2 Osteomyelitis of the left fourth finger HD catheter team visitation on TTE MRSA bacteremia 4/4 likely form above vs HD line infection Chronic Hypotension Back pain/shoulder pain-Mri shoulder , L spine negative Right Hip pain: 1 month of worsening L 4th finger-xray w/ osteomyelitis of the fourth mid and distal phalanges- Intra-op cx w/ e coli, and streps 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 BP soft chronically in 70s to 80s, lactic acid normal-patient now on midodrine  3 times daily MRSA sepsis/bacteremia source HD cath versus left finger. ID ortho nephrology following 6/1> echo showed vegetation on the tip of the catheter, HD catheter removed  and for new HD line  6/4 Continue contact isolation, continue vancomycin . TEE planned 6/3 then HD line. Follow-up MRI of right hip   Chronic hypotension: BP remains soft continue on midodrine   TID, PTA on prn  Elevated alk phos Gallbladder sludge: ALP 261, mildly elevated AST to 62 and bilirubin of 1.4.RUQ U/S shows some gallbladder sludge but no acute cholecystitis, CBD minimally increased from 2021 No RUQ pain.  Check labs intermittently    ESRD on HD MWF Anemia of chronic disease Mild hypokalemia Metabolic bone disease: Nephrology following continue HD as #1- HD cath out 6/1->with line holiday plan for new HD catheter  6/3.    Type 2 diabetes with hyperglycemia: Last A1c 10.0% on 07/17/2023, PTA on NovoLog  70/30 at 15 units bid- cont at lower dose 5 units BID, and  SSI ACHS Recent Labs  Lab 10/13/23 1152 10/13/23 1630 10/13/23 2222 10/13/23 2225 10/14/23 0711  GLUCAP 140* 150* 187* 188* 198*   HIV: Last CD4 count 315, viral load 27 on 04/16/23 now < 20, CD4 254 from 315. Continue Biktarvy  per ID  Thrombocytopenia, chronic: Monitor platelet counts.   Bilateral BKA: Cont b/l prosthetics

## 2023-10-09 NOTE — Anesthesia Postprocedure Evaluation (Signed)
 Anesthesia Post Note  Patient: Alvin Daniels  Procedure(s) Performed: AMPUTATION, FINGER (Left)     Patient location during evaluation: PACU Anesthesia Type: General Level of consciousness: awake and alert and oriented Pain management: pain level controlled Vital Signs Assessment: post-procedure vital signs reviewed and stable Respiratory status: spontaneous breathing, nonlabored ventilation and respiratory function stable Cardiovascular status: blood pressure returned to baseline and stable Postop Assessment: no apparent nausea or vomiting Anesthetic complications: no   No notable events documented.  Last Vitals:  Vitals:   10/09/23 1500 10/09/23 1505  BP: (!) 77/32 (!) 84/37  Pulse: 70 70  Resp: 14 15  Temp:    SpO2: 98% 94%    Last Pain:  Vitals:   10/09/23 1445  TempSrc:   PainSc: Asleep                 Nijah Orlich A.

## 2023-10-09 NOTE — Op Note (Addendum)
   Date of Surgery: 10/09/2023  INDICATIONS: Patient is a 53 y.o.-year-old male with a complex medical history including ESRD on HD, uncontrolled DM2, HIV, chronic hep C, status post bilateral BKA who is admitted with a left ring finger infection with radiographic evidence of osteomyelitis involving distal and middle phalanges.  Risks, benefits, and alternatives to surgery were again discussed with the patient in the preoperative area. The patient wishes to proceed with surgery.  Informed consent was signed after our discussion.   PREOPERATIVE DIAGNOSIS:  Left ring finger infection with P2 and P3 osteomyelitis  POSTOPERATIVE DIAGNOSIS: Same.  PROCEDURE:  Left ring finger amputation through head of proximal phalanx   SURGEON: Auburn Blaze, M.D.  ASSIST: None  ANESTHESIA:  Local + MAC  IV FLUIDS AND URINE: See anesthesia.  ESTIMATED BLOOD LOSS: 10 mL.  IMPLANTS: * No implants in log *   DRAINS: None  COMPLICATIONS: None noted  DESCRIPTION OF PROCEDURE: The patient was met in the preoperative holding area where the surgical site was marked and the consent form was signed.  The patient was then taken to the operating room and transferred to the operating table.  All bony prominences were well padded.   Monitored anesthesia  was induced.  The operative extremity was prepped and draped in the usual and sterile fashion.  A formal time-out was performed to confirm that this was the correct patient, surgery, side, and site.   Following formal timeout, a digital block was performed using 15 cc of 0.25% plain Marcaine .  A fishmouth type incision was designed around the level of the PIP joint as this seemed to be the demarcation point between the healthy appearing tissue and swelling/erythema.  Quarter inch Penrose drain was wrapped around the base of the finger, pulled taut, and clamped with a hemostat.  The skin was incised.  The dorsal extensor apparatus was divided.  The radial and ulnar  digital neurovascular bundles were identified and transected.  The flexor sheath was entered.  The flexor tendons were pulled and transected.  The finger was disarticulated at the PIP joint.  There was significant darkened discoloration of the entire middle phalanx with very soft, infected appearing bone.    Aerobic and anaerobic culture swabs  were taken from the removed, infected finger.A traction neurectomy was performed for the radial and ulnar digital nerves.  The digital arteries were coagulated with bipolar electrocautery.  The head of the proximal phalanx was excised using a rondure to allow for tension-free closure of the skin flaps.  The wound was thoroughly irrigated with copious sterile saline.  The skin was then closed using a 4-0 nylon suture in simple fashion.  The finger was then dressed with Xeroform, 4 x 4's, Kling wrap, and loose 1 inch Coban.  The Penrose drain was removed.  The patient was reversed from sedation.  They were transferred from the operating table to the postoperative bed.  All counts were correct x 2 at the end of the procedure.  The patient was then taken to the PACU in stable condition.   POSTOPERATIVE PLAN: Will remain admitted to the medicine service.  We will follow-up intraoperative cultures, however, patient has been on antibiotics since admission yesterday.  Can take a look at the wound at some point over the weekend.   Auburn Blaze, MD 1:43 PM

## 2023-10-09 NOTE — Plan of Care (Signed)
  Problem: Clinical Measurements: Goal: Diagnostic test results will improve Outcome: Progressing   Problem: Elimination: Goal: Will not experience complications related to bowel motility Outcome: Progressing   Problem: Pain Managment: Goal: General experience of comfort will improve and/or be controlled Outcome: Progressing   Problem: Safety: Goal: Ability to remain free from injury will improve Outcome: Progressing

## 2023-10-09 NOTE — Consult Note (Signed)
 Renal Service Consult Note [facility]  Alvin Daniels 10/09/2023 [name], MD Requesting Physician: [name]  Reason for Consult: ESRD pt w/ fevers, back pain, finger infection HPI: The patient is a 53 y.o. year-old w/ PMH as below who presented yesterday to ED c/o N/V for 1 wk, has not missed HD. HD MWF. Cramping pain in bilat lower abdomen. Also diarrhea. Also L ring finger wound x 1 month and worsening pain. In ED BP 91/55, HR 85, RR 14, temp 99.8.  93% sats on RA. Pt rec'd IV zosyn  and vanc in the ED. Xrays showed osteo of 4th L finger. CXR w/o edema. K+  3.8, bun 33, creat 7.8, Hb 9.5, wbc 12K.  Hand surgery consulted and pt was admitted. Finger amputation is planned for today. Blood cx's from 5/28 today are coming back +4/4 for MRSA. We are asked to see for ESRD.     Pt seen in room. No new c/o's, hx as above.    ROS - denies CP, no joint pain, no HA, no blurry vision, no rash, no diarrhea, no nausea/ vomiting  PMH: AIDS Chronic hepatitis C Diabetes ESRD on HD  Chronic hypotension MRSA infection Pancreatitis  Past Surgical History  Past Surgical History:  Procedure Laterality Date   AMPUTATION Left 04/20/2014   Procedure: 3rd toe amputation, 4th Toe Amputation,  5th Toe Amputation;  Surgeon: [name], MD;  Location: [facility] OR;  Service: Orthopedics;  Laterality: Left;   AMPUTATION Left 05/02/2014   Procedure: Midfoot Amputation;  Surgeon: [name], MD;  Location: [facility] OR;  Service: Orthopedics;  Laterality: Left;   AMPUTATION Left 06/17/2014   Procedure: AMPUTATION BELOW KNEE;  Surgeon: [name], MD;  Location: [facility] OR;  Service: Orthopedics;  Laterality: Left;   AMPUTATION Right 09/04/2018   Procedure: RIGHT BELOW KNEE AMPUTATION;  Surgeon: [name], MD;  Location: [facility] OR;  Service: Orthopedics;  Laterality: Right;  RIGHT BELOW KNEE AMPUTATION   AV FISTULA PLACEMENT Left    AV FISTULA PLACEMENT Left 05/10/2016   Procedure:  Creation Left Arm Brachiocephalic Arteriovenous Fistula and Ligation of Radiocephalic Fistula;  Surgeon: [name], MD;  Location: [facility] OR;  Service: Vascular;  Laterality: Left;   DIALYSIS/PERMA CATHETER INSERTION N/A 09/01/2023   Procedure: DIALYSIS/PERMA CATHETER INSERTION;  Surgeon: Patrick Boor, MD;  Location: [facility] INVASIVE CV LAB;  Service: Cardiovascular;  Laterality: N/A;   FEMUR IM NAIL Left 08/17/2016   Procedure: INTRAMEDULLARY (IM) RETROGRADE FEMORAL NAILING;  Surgeon: Adah Acron, MD;  Location: [facility] OR;  Service: Orthopedics;  Laterality: Left;   FOOT AMPUTATION THROUGH ANKLE Left 12/'21/2015   midfoot   IR AV DIALY SHUNT INTRO NEEDLE/INTRACATH INITIAL W/PTA/IMG LEFT  04/17/2023   IR GENERIC HISTORICAL Left 05/01/2016   IR THROMBECTOMY AV FISTULA W/THROMBOLYSIS/PTA INC/SHUNT/IMG LEFT 05/01/2016 [name], MD [facility]   IR GENERIC HISTORICAL  05/01/2016   IR US  GUIDE VASC ACCESS LEFT 05/01/2016 [name], MD [facility]   IR GENERIC HISTORICAL  05/07/2016   IR FLUORO GUIDE CV LINE RIGHT 05/07/2016 [name], DO [facility]   IR GENERIC HISTORICAL  05/07/2016   IR US  GUIDE VASC ACCESS RIGHT 05/07/2016 [name], DO [facility]   IR GENERIC HISTORICAL  05/22/2016   IR US  GUIDE VASC ACCESS RIGHT 05/22/2016 [name], MD [facility]   IR GENERIC HISTORICAL  05/22/2016   IR FLUORO GUIDE CV LINE RIGHT 05/22/2016 [name], MD [facility]   IR  REMOVAL TUN CV CATH W/O FL  08/21/2016   IR US  GUIDE VASC ACCESS LEFT  04/17/2023   PERIPHERAL VASCULAR CATHETERIZATION Left 05/09/2016   Procedure: A/V Fistulagram;  Surgeon: [name], MD;  Location: [facility] INVASIVE CV LAB;  Service: Cardiovascular;  Laterality: Left;  arm   TEE WITHOUT CARDIOVERSION N/A 06/22/2018   Procedure: TRANSESOPHAGEAL ECHOCARDIOGRAM (TEE);  Surgeon: [name], MD;  Location: [facility] ENDOSCOPY;  Service: Cardiovascular;  Laterality: N/A;   Family History  Family History   Problem Relation Age of Onset   Diabetes Mother    Diabetes Father    Social History  reports that he has never smoked. He has never used smokeless tobacco. He reports that he does not drink alcohol  and does not use drugs. Allergies No Known Allergies Home medications Prior to Admission medications   Medication Sig Start Date End Date Taking? Authorizing Provider  bictegravir-emtricitabine -tenofovir  AF (BIKTARVY ) 50-200-25 MG TABS tablet Take 1 tablet by mouth daily. 04/16/23   [name], MD  insulin  aspart protamine  - aspart (NOVOLOG  70/30 FLEXPEN) (70-30) 100 UNIT/ML FlexPen Inject 20 Units into the skin 2 (two) times daily. Patient taking differently: Inject 15 Units into the skin 2 (two) times daily. 07/17/23   [name], MD  midodrine  (PROAMATINE ) 10 MG tablet Take 1 tablet (10 mg total) by mouth 3 (three) times daily as needed for low blood pressure, SBP</=100. may take 30 min prior to dialysis and mid treatment as needed. 09/05/23     bictegravir-emtricitabine -tenofovir  AF (BIKTARVY ) 50-200-25 MG TABS tablet Take 1 tablet by mouth daily. 01/02/22   [name], MD     Vitals:   10/08/23 2200 10/08/23 2247 10/09/23 0400 10/09/23 0827  BP: (!) 85/55 (!) 103/57 (!) 84/47 95/74  Pulse: 85 95 81 80  Resp: 17 17 17    Temp:  99.8 F (37.7 C) 98.8 F (37.1 C) 98.2 F (36.8 C)  TempSrc:   Oral Oral  SpO2: 92% 91% 93% 96%  Weight:      Height:       Exam Gen alert, no distress No rash, cyanosis or gangrene Sclera anicteric, throat clear  No jvd or bruits Chest clear bilat to bases, no rales/ wheezing RRR no MRG Abd soft ntnd no mass or ascites +bs GU nl male MS no joint effusions or deformity Ext 1+  LE edema, no other edema Neuro is alert, Ox 3 , nf    TDC intact      Renal-related home meds: Midodrine  10 mg tid as prn BP < 100 Others: biktarvy , insulin  70/30   OP HD: [facility] MWF 3.5h  B400  45.5kg   TDC   Heparin  2000 Last OP HD 5/28, post wt  45.7kg Very small wt gains, always makes it to dry wt    Assessment/ Plan: MRSA bacteremia: getting IV abx, and finger amputation. Needs line holiday as well. Will consult IR for [facility] removal tomorrow after HD.  ESRD: on HD MWF. HD tomorrow.  Chronic hypotension: BP's at OP HD as typically low in the 80's-90s or lower during the sessions.  Volume: wt's are up, UF 2-2.5 L next HD, on RA Anemia of esrd: Hb 9-11, follow.  Secondary hyperparathyroidism: CCa and phos in range, follow.    Larry Poag  MD [facility] 10/09/2023, 10:23 AM  Recent Labs  Lab 10/08/23 1634 10/09/23 0631  HGB 10.6* 9.5*  ALBUMIN  2.7* 2.3*  CALCIUM  8.9 8.5*  PHOS  --  4.5  CREATININE 6.93* 7.78*  K 3.1* 3.8   Inpatient medications:  bictegravir-emtricitabine -tenofovir  AF  1 tablet Oral Daily   Chlorhexidine  Gluconate Cloth  6 each Topical Q0600   heparin   5,000 Units Subcutaneous Q8H   insulin  aspart  0-6 Units Subcutaneous Q4H   lidocaine   1 patch Transdermal Q24H   mupirocin  ointment  1 Application Nasal BID    cefTRIAXone  (ROCEPHIN )  IV 2 g (10/09/23 0950)   [START ON 10/10/2023] vancomycin  (VANCOCIN ) 500 mg in sodium chloride  0.9 % 100 mL IVPB     acetaminophen  **OR** acetaminophen , HYDROmorphone  (DILAUDID ) injection, midodrine , ondansetron  **OR** ondansetron  (ZOFRAN ) IV, senna-docusate

## 2023-10-09 NOTE — Addendum Note (Signed)
 Addendum  created 10/09/23 1522 by Tura Gaines, MD   Clinical Note Signed

## 2023-10-09 NOTE — Progress Notes (Signed)
 PIV to right hand occluded. Removed in pre-op by this RN. PIV was not charted in flowsheet under IV assessment. Documenting removal at 1143, catheter bent severely but intact. Pt tolerated well. No bleeding at site.   Pia Brew, RN

## 2023-10-09 NOTE — Transfer of Care (Signed)
 Immediate Anesthesia Transfer of Care Note  Patient: Alvin Daniels  Procedure(s) Performed: AMPUTATION, FINGER (Left)  Patient Location: PACU  Anesthesia Type:MAC  Level of Consciousness: awake, alert , and oriented  Airway & Oxygen Therapy: Patient Spontanous Breathing and Patient connected to face mask oxygen  Post-op Assessment: Report given to RN and Post -op Vital signs reviewed and stable  Post vital signs: Reviewed and stable  Last Vitals:  Vitals Value Taken Time  BP 60/40 10/09/23 1420  Temp 36.4 C 10/09/23 1355  Pulse 62 10/09/23 1422  Resp 17 10/09/23 1422  SpO2 99 % 10/09/23 1422  Vitals shown include unfiled device data.  Last Pain:  Vitals:   10/09/23 1350  TempSrc:   PainSc: Asleep         Complications: No notable events documented.

## 2023-10-10 ENCOUNTER — Inpatient Hospital Stay (HOSPITAL_COMMUNITY): Payer: Self-pay

## 2023-10-10 ENCOUNTER — Encounter (HOSPITAL_COMMUNITY): Payer: Self-pay | Admitting: Orthopedic Surgery

## 2023-10-10 LAB — RENAL FUNCTION PANEL
Albumin: 2.6 g/dL — ABNORMAL LOW (ref 3.5–5.0)
Anion gap: 19 — ABNORMAL HIGH (ref 5–15)
BUN: 15 mg/dL (ref 6–20)
CO2: 21 mmol/L — ABNORMAL LOW (ref 22–32)
Calcium: 8.8 mg/dL — ABNORMAL LOW (ref 8.9–10.3)
Chloride: 95 mmol/L — ABNORMAL LOW (ref 98–111)
Creatinine, Ser: 3.77 mg/dL — ABNORMAL HIGH (ref 0.61–1.24)
GFR, Estimated: 18 mL/min — ABNORMAL LOW (ref >60)
Glucose, Bld: 131 mg/dL — ABNORMAL HIGH (ref 70–99)
Phosphorus: 2.1 mg/dL — ABNORMAL LOW (ref 2.5–4.6)
Potassium: 3.7 mmol/L (ref 3.5–5.1)
Sodium: 135 mmol/L (ref 135–145)

## 2023-10-10 LAB — COMPREHENSIVE METABOLIC PANEL WITH GFR
ALT: 22 U/L (ref 0–44)
AST: 31 U/L (ref 15–41)
Albumin: 2.3 g/dL — ABNORMAL LOW (ref 3.5–5.0)
Alkaline Phosphatase: 315 U/L — ABNORMAL HIGH (ref 38–126)
Anion gap: 16 — ABNORMAL HIGH (ref 5–15)
BUN: 50 mg/dL — ABNORMAL HIGH (ref 6–20)
CO2: 15 mmol/L — ABNORMAL LOW (ref 22–32)
Calcium: 8.5 mg/dL — ABNORMAL LOW (ref 8.9–10.3)
Chloride: 101 mmol/L (ref 98–111)
Creatinine, Ser: 9.92 mg/dL — ABNORMAL HIGH (ref 0.61–1.24)
GFR, Estimated: 6 mL/min — ABNORMAL LOW (ref >60)
Glucose, Bld: 231 mg/dL — ABNORMAL HIGH (ref 70–99)
Potassium: 4.2 mmol/L (ref 3.5–5.1)
Sodium: 132 mmol/L — ABNORMAL LOW (ref 135–145)
Total Bilirubin: 1.3 mg/dL — ABNORMAL HIGH (ref 0.0–1.2)
Total Protein: 6.7 g/dL (ref 6.5–8.1)

## 2023-10-10 LAB — HIV-1 RNA QUANT-NO REFLEX-BLD
HIV 1 RNA Quant: <20 {copies}/mL
LOG10 HIV-1 RNA: UNDETERMINED {Log_copies}/mL

## 2023-10-10 LAB — CBC
HCT: 27.9 % — ABNORMAL LOW (ref 39.0–52.0)
HCT: 28.7 % — ABNORMAL LOW (ref 39.0–52.0)
Hemoglobin: 9.2 g/dL — ABNORMAL LOW (ref 13.0–17.0)
Hemoglobin: 9.7 g/dL — ABNORMAL LOW (ref 13.0–17.0)
MCH: 33.6 pg (ref 26.0–34.0)
MCH: 33.6 pg (ref 26.0–34.0)
MCHC: 33 g/dL (ref 30.0–36.0)
MCHC: 33.8 g/dL (ref 30.0–36.0)
MCV: 101.8 fL — ABNORMAL HIGH (ref 80.0–100.0)
MCV: 99.3 fL (ref 80.0–100.0)
Platelets: 68 10*3/uL — ABNORMAL LOW (ref 150–400)
Platelets: 78 10*3/uL — ABNORMAL LOW (ref 150–400)
RBC: 2.74 MIL/uL — ABNORMAL LOW (ref 4.22–5.81)
RBC: 2.89 MIL/uL — ABNORMAL LOW (ref 4.22–5.81)
RDW: 15.2 % (ref 11.5–15.5)
RDW: 14.8 % (ref 11.5–15.5)
WBC: 13.3 10*3/uL — ABNORMAL HIGH (ref 4.0–10.5)
WBC: 14.2 10*3/uL — ABNORMAL HIGH (ref 4.0–10.5)
nRBC: 0 % (ref 0.0–0.2)
nRBC: 0 % (ref 0.0–0.2)

## 2023-10-10 LAB — GLUCOSE, CAPILLARY
Glucose-Capillary: 117 mg/dL — ABNORMAL HIGH (ref 70–99)
Glucose-Capillary: 133 mg/dL — ABNORMAL HIGH (ref 70–99)
Glucose-Capillary: 149 mg/dL — ABNORMAL HIGH (ref 70–99)
Glucose-Capillary: 232 mg/dL — ABNORMAL HIGH (ref 70–99)
Glucose-Capillary: 242 mg/dL — ABNORMAL HIGH (ref 70–99)

## 2023-10-10 MED ORDER — MIDODRINE HCL 5 MG PO TABS
ORAL_TABLET | ORAL | Status: AC
  Filled 2023-10-10: qty 2

## 2023-10-10 MED ORDER — GADOBUTROL 1 MMOL/ML IV SOLN
5.0000 mL | Freq: Once | INTRAVENOUS | Status: AC | PRN
  Administered 2023-10-10: 5 mL via INTRAVENOUS

## 2023-10-10 MED ORDER — HEPARIN SODIUM (PORCINE) 1000 UNIT/ML DIALYSIS: 2000.0000 [IU] | Freq: Once | INTRAMUSCULAR | Status: DC

## 2023-10-10 NOTE — Progress Notes (Signed)
  [facility] Progress Note   Subjective:  Seen in [facility]. UF goal 3L. Tolerating well. No c/os. To remove catheter for line holiday after dialysis.   Objective Vitals:   10/10/23 0807 10/10/23 0815 10/10/23 0830 10/10/23 0900  BP: 92/60 94/61 98/62  (S) (!) 88/54  Pulse:  79 78 80  Resp: 18 16 19 17   Temp:      TempSrc:      SpO2:  100% 100% 100%  Weight:      Height:          Additional Objective Labs: Basic Metabolic Panel: Recent Labs  Lab 10/08/23 1634 10/09/23 0631 10/10/23 0628  NA 136 134* 132*  K 3.1* 3.8 4.2  CL 95* 101 101  CO2 26 21* 15*  GLUCOSE 79 149* 231*  BUN 28* 33* 50*  CREATININE 6.93* 7.78* 9.92*  CALCIUM  8.9 8.5* 8.5*  PHOS  --  4.5  --    CBC: Recent Labs  Lab 10/08/23 1634 10/09/23 0631  WBC 12.2* 12.2*  NEUTROABS 10.7*  --   HGB 10.6* 9.5*  HCT 31.2* 28.9*  MCV 97.2 99.0  PLT 87* 74*   Blood Culture    Component Value Date/Time   SDES TISSUE 10/09/2023 1329   SPECREQUEST LEFT FINGER ABSCESS 10/09/2023 1329   CULT  10/09/2023 1329    CULTURE REINCUBATED FOR BETTER GROWTH Performed at [facility], [address] [place], [place] [address]    REPTSTATUS PENDING 10/09/2023 1329     Physical Exam General: Alert, nad  Heart: RRR Lungs: Clear Abdomen: non-tender  Extremities: BKA;  Dialysis Access: TDC in use on HD  Medications:  albumin  human     cefTRIAXone  (ROCEPHIN )  IV Stopped (10/09/23 1020)   vancomycin  (VANCOCIN ) 500 mg in sodium chloride  0.9 % 100 mL IVPB      bictegravir-emtricitabine -tenofovir  AF  1 tablet Oral Daily   Chlorhexidine  Gluconate Cloth  6 each Topical Q0600   Chlorhexidine  Gluconate Cloth  6 each Topical Q0600   [START ON 10/11/2023] heparin   2,000 Units Dialysis Once in dialysis   heparin   5,000 Units Subcutaneous Q8H   insulin  aspart  0-6 Units Subcutaneous Q4H   lidocaine   1 patch Transdermal Q24H   midodrine   10 mg Oral TID   mupirocin  ointment  1 Application Nasal BID     Dialysis Orders:  NW MWF 3.5h  B400  45.5kg   TDC   Heparin  2000 Last OP HD 5/28, post wt 45.7kg Very small wt gains, always makes it to dry wt  Assessment/Plan: MRSA bacteremia.  IV Vancomycin  started. Has TDC  also w finger necrosis. ID consulted. Needs line holiday. IR consulted to remove [facility] after HD today.  Finger infection. Osteo on imaging. 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  ESRD: on HD MWF. HD tomorrow.  Chronic hypotension: BP's at OP HD as typically low in the 80's-90s or lower during the sessions.  Volume: wt's are up, UF 2-2.5 L next HD, on RA Anemia of esrd: Hb 9-11, follow.  Secondary hyperparathyroidism: Calcium /Phos acceptable. Continue home meds.  HIV. On Bictarvy.   Elona Hal PA-C [facility] 10/10/2023,9:07 AM

## 2023-10-10 NOTE — Progress Notes (Signed)
 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 Lspine xray negative but sx only of a week   Cxr right lung nodular opacity ?septic emboli   Tte   If the bone/joint imaging is suggestive of involvement and bcx clearance is achieved can avoid tee   Left avf doesn't appear grossly infected but some tenderness ?focal involvement less likely, maybe shoulder referred pain, but will ask nephrology to comment on   Hiv controlled on biktarvy ; sees rcid Recent Labs       Lab Results  Component Value Date    HIV1RNAQUANT 27 (H) 04/16/2023      Recent Labs       Lab Results  Component Value Date    CD4TCELL 25 (L) 04/16/2023    CD4TABS 315 (L) 04/16/2023         ----------- 5/30 Pending tte Mri left shoulder/lumbar spine no sign of infection S/p left 4th finger amputation at proximal phalanx (from mri finding, this appear to be grossly clean margin); cx gpc and gnr but I do not suspect we have to treat further for the finger  Will focus treatment on mrsa Pending dialysis today and line removal  PLAN:   Continue vancomycin  Await Tte F/u repeat bcx (to be done when dialysis line removed I think with the mri finding negative, suspect he'll need tee which can be arranged next week Maintain contact isolation Continue biktarvy  F/u rcid on discharge to be  arranged Discussed with primary team  Principal Problem:   Sepsis ([name]) Active Problems:   Hypokalemia   Osteomyelitis of left hand ([name])   Biliary sludge determined by ultrasound   No Known Allergies  Scheduled Meds:  bictegravir-emtricitabine -tenofovir  AF  1 tablet Oral Daily   Chlorhexidine  Gluconate Cloth  6 each Topical Q0600   Chlorhexidine  Gluconate Cloth  6 each Topical Q0600   heparin   5,000 Units Subcutaneous Q8H   insulin  aspart  0-6 Units Subcutaneous Q4H   lidocaine   1 patch Transdermal Q24H   midodrine   10 mg Oral TID   mupirocin  ointment  1 Application Nasal BID   Continuous Infusions:  albumin  human     cefTRIAXone  (ROCEPHIN )  IV 2 g (10/10/23 1248)   vancomycin  (VANCOCIN ) 500 mg in sodium chloride  0.9 % 100 mL IVPB 500 mg (10/10/23 1341)   PRN Meds:.acetaminophen  **OR** acetaminophen , albumin  human, HYDROmorphone  (DILAUDID ) injection, ondansetron  **OR** ondansetron  (ZOFRAN ) IV, senna-docusate   SUBJECTIVE: Mild pain left shoulder, no complaint left groin today S/p left 4th finger amputation at the middle of proximal phalanx  Review of Systems: ROS All other ROS was negative, except mentioned above     OBJECTIVE: Vitals:   10/10/23 1130 10/10/23 1135 10/10/23 1138 10/10/23 1418  BP: (S) (!) 75/45 (!) 80/53 (!) 92/57 (!) 93/52  Pulse: 85  81 (!) 59  Resp: 19 16  19 15  Temp:  (!) 97.5 F (36.4 C) (!) 97.5 F (36.4 C) 99.1 F (37.3 C)  TempSrc:    Oral  SpO2: 100%  100% 98%  Weight:   47.5 kg   Height:       Body mass index is 21.15 kg/m.  Physical Exam General/constitutional: no distress, pleasant HEENT: Normocephalic, left eye cornea/conj scarring/eye lid proptosis (chronic) Neck supple CV: rrr no mrg Lungs: clear to auscultation, normal respiratory effort Abd: Soft, Nontender Ext: no edema Skin: plaque/atrophic scar right chest with 2 linear scabbing ulcer no sign of cellulitis; the iHD cath is within this atrophic scarring skin  bed Neuro: nonfocal MSK: bilateral bka stump no ulceration/tenderness; the left shoulder and bilateral hip joint relative intact active rom with mild tenderness only on left hip/shoulder joint   Central line presence: right tunneled chest hd cath site no purulence/redness   Lab Results Lab Results  Component Value Date   WBC 14.2 (H) 10/10/2023   HGB 9.7 (L) 10/10/2023   HCT 28.7 (L) 10/10/2023   MCV 99.3 10/10/2023   PLT 78 (L) 10/10/2023    Lab Results  Component Value Date   CREATININE 3.77 (H) 10/10/2023   BUN 15 10/10/2023   NA 135 10/10/2023   K 3.7 10/10/2023   CL 95 (L) 10/10/2023   CO2 21 (L) 10/10/2023    Lab Results  Component Value Date   ALT 22 10/10/2023   AST 31 10/10/2023   ALKPHOS 315 (H) 10/10/2023   BILITOT 1.3 (H) 10/10/2023      Microbiology: Recent Results (from the past 240 hours)  Blood Cultures x 2 sites     Status: Abnormal (Preliminary result)   Collection Time: 10/08/23  7:38 PM   Specimen: BLOOD RIGHT HAND  Result Value Ref Range Status   Specimen Description BLOOD RIGHT HAND  Final   Special Requests   Final    BOTTLES DRAWN AEROBIC AND ANAEROBIC Blood Culture adequate volume   Culture  Setup Time   Final    GRAM POSITIVE COCCI IN BOTH AEROBIC AND ANAEROBIC BOTTLES CRITICAL VALUE NOTED.  VALUE IS CONSISTENT WITH PREVIOUSLY REPORTED AND CALLED VALUE. Performed at [facility], [address], [place], [place] [address]    Culture STAPHYLOCOCCUS AUREUS (A)  Final   Report Status PENDING  Incomplete  Blood Cultures x 2 sites     Status: Abnormal (Preliminary result)   Collection Time: 10/08/23  7:43 PM   Specimen: BLOOD RIGHT FOREARM  Result Value Ref Range Status   Specimen Description BLOOD RIGHT FOREARM  Final   Special Requests   Final    BOTTLES DRAWN AEROBIC AND ANAEROBIC Blood Culture adequate volume   Culture  Setup Time   Final    GRAM POSITIVE COCCI IN CLUSTERS IN BOTH AEROBIC AND ANAEROBIC BOTTLES CRITICAL  RESULT CALLED TO, READ BACK BY AND VERIFIED WITH: Carmie Chough on [id number] @0900  by SM    Culture (A)  Final    STAPHYLOCOCCUS AUREUS SUSCEPTIBILITIES TO FOLLOW Performed at [facility], [address], [place], [place] [address]    Report Status PENDING  Incomplete  Blood Culture ID Panel (Reflexed)     Status: Abnormal   Collection Time: 10/08/23  7:43 PM  Result Value Ref Range Status   Enterococcus faecalis NOT DETECTED NOT DETECTED Final   Enterococcus Faecium NOT DETECTED NOT DETECTED Final   Listeria monocytogenes NOT DETECTED NOT DETECTED Final   Staphylococcus species DETECTED (A)  NOT DETECTED Final    Comment: CRITICAL RESULT CALLED TO, READ BACK BY AND VERIFIED WITH: Carmie Chough on 052925 @0900  by SM    Staphylococcus aureus (BCID) DETECTED (A) NOT DETECTED Final    Comment: Methicillin (oxacillin)-resistant Staphylococcus aureus (MRSA). MRSA is predictably resistant to beta-lactam antibiotics (except ceftaroline). Preferred therapy is vancomycin  unless clinically contraindicated. Patient requires contact precautions if  hospitalized. CRITICAL RESULT CALLED TO, READ BACK BY AND VERIFIED WITH: Carmie Chough on [phone] @0900  by SM    Staphylococcus epidermidis NOT DETECTED NOT DETECTED Final   Staphylococcus lugdunensis NOT DETECTED NOT DETECTED Final   Streptococcus species NOT DETECTED NOT DETECTED Final   Streptococcus agalactiae NOT DETECTED NOT DETECTED Final   Streptococcus pneumoniae NOT DETECTED NOT DETECTED Final   Streptococcus pyogenes NOT DETECTED NOT DETECTED Final   A.calcoaceticus-baumannii NOT DETECTED NOT DETECTED Final   Bacteroides fragilis NOT DETECTED NOT DETECTED Final   Enterobacterales NOT DETECTED NOT DETECTED Final   Enterobacter cloacae complex NOT DETECTED NOT DETECTED Final   Escherichia coli NOT DETECTED NOT DETECTED Final   Klebsiella aerogenes NOT DETECTED NOT DETECTED Final   Klebsiella oxytoca NOT DETECTED NOT DETECTED Final    Klebsiella pneumoniae NOT DETECTED NOT DETECTED Final   Proteus species NOT DETECTED NOT DETECTED Final   Salmonella species NOT DETECTED NOT DETECTED Final   Serratia marcescens NOT DETECTED NOT DETECTED Final   Haemophilus influenzae NOT DETECTED NOT DETECTED Final   Neisseria meningitidis NOT DETECTED NOT DETECTED Final   Pseudomonas aeruginosa NOT DETECTED NOT DETECTED Final   Stenotrophomonas maltophilia NOT DETECTED NOT DETECTED Final   Candida albicans NOT DETECTED NOT DETECTED Final   Candida auris NOT DETECTED NOT DETECTED Final   Candida glabrata NOT DETECTED NOT DETECTED Final   Candida krusei NOT DETECTED NOT DETECTED Final   Candida parapsilosis NOT DETECTED NOT DETECTED Final   Candida tropicalis NOT DETECTED NOT DETECTED Final   Cryptococcus neoformans/gattii NOT DETECTED NOT DETECTED Final   Meth resistant mecA/C and MREJ DETECTED (A) NOT DETECTED Final    Comment: CRITICAL RESULT CALLED TO, READ BACK BY AND VERIFIED WITH: Carmie Chough on 052925 @0900  by SM Performed at [facility], [address], [place], [place] [address]   Surgical pcr screen     Status: Abnormal   Collection Time: 10/09/23  2:22 AM   Specimen: Nasal Mucosa; Nasal Swab  Result Value Ref Range Status   MRSA, PCR POSITIVE (A) NEGATIVE Final    Comment: RESULT CALLED TO, READ BACK BY AND VERIFIED WITH: P BROWN RN 10/09/2023 @ 0526 BY AB    Staphylococcus aureus POSITIVE (A) NEGATIVE Final    Comment: (NOTE) The Xpert SA Assay (FDA approved for NASAL specimens in patients 67 years of age and older), is one component of a comprehensive surveillance program. It is not intended to diagnose infection nor to guide or monitor treatment. Performed at [facility], [address], [place], [place] [address]   Aerobic/Anaerobic Culture w Gram Stain (surgical/deep wound)     Status: None (Preliminary result)   Collection Time: 10/09/23  1:29 PM   Specimen: Finger, Left; Tissue  Result Value  Ref Range Status   Specimen Description TISSUE  Final   Special Requests LEFT FINGER ABSCESS  Final   Gram Stain   Final    NO WBC SEEN ABUNDANT GRAM POSITIVE COCCI ABUNDANT GRAM NEGATIVE RODS    Culture   Final    RARE GRAM NEGATIVE RODS IDENTIFICATION AND  SUSCEPTIBILITIES TO FOLLOW CULTURE REINCUBATED FOR BETTER GROWTH Performed at [facility], [address] [place], [place] [address]    Report Status PENDING  Incomplete     Serology:   Imaging: If present, new imagings (plain films, ct scans, and mri) have been personally visualized and interpreted; radiology reports have been reviewed. Decision making incorporated into the Impression / Recommendations.  5/30 mri left shoulder 1. No specific findings of septic arthritis or osteomyelitis. 2. Mild supraspinatus tendinopathy. 3. Trace subacromial subdeltoid bursitis. 4. Mild degenerative chondral thinning in the glenohumeral joint.  5/30 mri lumbar spine Normal mri lumbar spine     Jamesetta Mcbride, MD [facility] [phone] pager    10/10/2023, 2:36 PM

## 2023-10-10 NOTE — Plan of Care (Signed)
  Problem: Education: Goal: Knowledge of General Education information will improve Description: Including pain rating scale, medication(s)/side effects and non-pharmacologic comfort measures Outcome: Progressing   Problem: Pain Managment: Goal: General experience of comfort will improve and/or be controlled Outcome: Progressing   Problem: Skin Integrity: Goal: Risk for impaired skin integrity will decrease Outcome: Progressing

## 2023-10-10 NOTE — Plan of Care (Signed)

## 2023-10-10 NOTE — Progress Notes (Signed)
   10/10/23 1138  Vitals  Temp (!) 97.5 F (36.4 C)  Pulse Rate 81  Resp 19  BP (!) 92/57  SpO2 100 %  O2 Device Room Air  Weight 47.5 kg  Type of Weight Post-Dialysis  Oxygen Therapy  Patient Activity (if Appropriate) In bed  Pulse Oximetry Type Continuous  Oximetry Probe Site Changed No  Post Treatment  Dialyzer Clearance Lightly streaked  Hemodialysis Intake (mL) 0 mL  Liters Processed 81.1  Fluid Removed (mL) 1900 mL  Tolerated HD Treatment Yes   Received patient in bed to unit.  Alert and oriented.  Informed consent signed and in chart.   TX duration:3.25hrs  Patient tolerated well.  Transported back to the room  Alert, without acute distress.  Hand-off given to patient's nurse.   Access used: [facility] Access issues: none  Total UF removed: 1.9L  Medication(s) given: midodrine    Na'Shaminy T [facility]

## 2023-10-10 NOTE — Progress Notes (Signed)
 PROGRESS NOTE Alvin Daniels  ZOX:[id number] DOB: 12/20/1970 DOA: 10/08/2023 PCP: [name], MD  Brief Narrative/Hospital Course: a 53 y.o. male with medical history significant for ESRD on HD MWF, uncontrolled type 2 diabetes, HIV, chronic hepatitis C, and s/p bilateral BKA who presented to the ED for evaluation of nausea/vomiting and cramping abdominal pain for a week feeling feverish since Saturday, having pain in his low back afebrile and lower abdomen and admitted for sepsis secondary to left fourth finger osteomyelitis. In the ED hypotensive 78/52, not hypoxic.  Labs showed hypokalemia 3.1, normal lactic acid 1.3> 0.9, leukocytosis 12 anemia 10.6 thrombocytopenia 87 MRSA PCR positive , elevated but flat troponin 53> 54 Blood culture sent,, Started on vancomycin  and ceftriaxone   Subjective: Seen and examined in dialysis Resting comfortably  Minimal pain in the left shoulder, no more back pain  overnight afebrile BP remains soft, getting dialysis today  Assessment and plan:  Severe Sepsis POA 2/2 Osteomyelitis of the left fourth finger MRSA bacteremia 4/4 likely form above Chronic Hypotension Back pain/shoulder pain: 1 month of worsening L 4th finger-xray w/ osteomyelitis of the fourth mid and distal phalanges 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 BP soft chronically in 70s to 80s, lactic acid normal. MRSA sepsis/bacteremia source HD cath versus left finger. ID on board-continue vancomycin -follow-up culture data blood and IntraOp.  MRI left shoulder pending . MRI L spine>normal. Nephrology aware he will need HD catheter removal -post HD- IR is consulted for line holiday  Chronic hypotension Continues to have low BP with SBP in the 80-90s> changed midodrine  to 3 times daily from prn.  Elevated alk phos Gallbladder sludge: ALP 261, mildly elevated AST to 62 and bilirubin of 1.4 RUQ U/S shows some gallbladder sludge but no acute cholecystitis, CBD  minimally increased from 2021 No RUQ pain.  Trend labs-AST ALT remains normal improved.  Alk phos high, trend    ESRD on HD MWF Anemia of chronic disease Mild hypokalemia Metabolic bone disease: Reports compliance with his HD schedule, had HD 5/28. For HD 5/30    Type 2 diabetes with hyperglycemiaL Last A1c 10.0% on 07/17/2023, PTA on NovoLog  70/30 15 units twice daily at home Cont ssi q4hr for now Recent Labs  Lab 10/09/23 1351 10/09/23 1621 10/09/23 2040 10/09/23 2359 10/10/23 0455  GLUCAP 142* 141* 140* 149* 242*   HIV Last CD4 count 315, viral load 27 on 04/16/23. repeat lasb pending Continue Biktarvy    Bilateral BKA Uses prosthetics   DVT prophylaxis: heparin  injection 5,000 Units Start: 10/09/23 0030 Code Status:   Code Status: Full Code Family Communication: plan of care discussed with patient at bedside. Patient status is: Remains hospitalized because of severity of illness Level of care: Telemetry Medical   Dispo: The patient is from: home            Anticipated disposition: TBD Objective: Vitals last 24 hrs: Vitals:   10/10/23 0830 10/10/23 0900 10/10/23 0915 10/10/23 0930  BP: 98/62 (S) (!) 88/54 93/63 (S) (!) 79/51  Pulse: 78 80 75 75  Resp: 19 17 20 15   Temp:      TempSrc:      SpO2: 100% 100% 100% 100%  Weight:      Height:        Physical Examination: General exam: alert awake HEENT:Oral mucosa moist, Ear/Nose WNL grossly Respiratory system: Bilaterally clear BS,no use of accessory muscle. HD cath+ on chest Cardiovascular system: S1 & S2 +, No JVD. Gastrointestinal system: Abdomen soft,NT,ND, BS+ Nervous  System: Alert, awake, moving all extremities,and following commands. Extremities: b/l BKA-moving well , mildly tender left shoulder Skin: No rashes,no icterus. MSK: Normal muscle bulk,tone, power   Data Reviewed: I have personally reviewed following labs and imaging studies ( see epic result tab) CBC: Recent Labs  Lab 10/08/23 1634  10/09/23 0631  WBC 12.2* 12.2*  NEUTROABS 10.7*  --   HGB 10.6* 9.5*  HCT 31.2* 28.9*  MCV 97.2 99.0  PLT 87* 74*   CMP: Recent Labs  Lab 10/08/23 1634 10/08/23 1947 10/09/23 0631 10/10/23 0628  NA 136  --  134* 132*  K 3.1*  --  3.8 4.2  CL 95*  --  101 101  CO2 26  --  21* 15*  GLUCOSE 79  --  149* 231*  BUN 28*  --  33* 50*  CREATININE 6.93*  --  7.78* 9.92*  CALCIUM  8.9  --  8.5* 8.5*  MG  --  2.1  --   --   PHOS  --   --  4.5  --    GFR: Estimated Creatinine Clearance: 5.9 mL/min (A) (by C-G formula based on SCr of 9.92 mg/dL (H)). Recent Labs  Lab 10/08/23 1634 10/09/23 0631 10/10/23 0628  AST 52*  --  31  ALT 34  --  22  ALKPHOS 261*  --  315*  BILITOT 1.4*  --  1.3*  PROT 7.9  --  6.7  ALBUMIN  2.7* 2.3* 2.3*    Recent Labs  Lab 10/08/23 1634  LIPASE 32   No results for input(s): "AMMONIA" in the last 168 hours. Coagulation Profile:  Recent Labs  Lab 10/08/23 1947  INR 1.2   Unresulted Labs (From admission, onward)     Start     Ordered   10/10/23 0704  Renal function panel  Once,   R        10/10/23 0704   10/10/23 0704  CBC  Once,   R        10/10/23 0704   10/10/23 0500  Comprehensive metabolic panel with GFR  Daily,   R      10/09/23 0833   10/10/23 0500  CBC  Daily,   R      10/09/23 0833   10/09/23 2000  Hepatitis B surface antibody,quantitative  (New Admission Hemo Labs (Hepatitis B))  Once,   R        10/09/23 1629   10/09/23 0833  C Difficile Quick Screen w PCR reflex  (C Difficile quick screen w PCR reflex panel )  Once, for 24 hours,   TIMED       References:    CDiff Information Tool   10/09/23 0832   10/09/23 0500  HIV-1 RNA quant-no reflex-bld  Tomorrow morning,   R        10/09/23 0206   10/08/23 1634  Urinalysis, Routine w reflex microscopic -Urine, Clean Catch  (ED Abdominal Pain)  Once,   URGENT       Question:  Specimen Source  Answer:  Urine, Clean Catch   10/08/23 1633            Antimicrobials/Microbiology: Anti-infectives (From admission, onward)    Start     Dose/Rate Route Frequency Ordered Stop   10/10/23 1200  vancomycin  (VANCOCIN ) 500 mg in sodium chloride  0.9 % 100 mL IVPB        500 mg 110 mL/hr over 60 Minutes Intravenous Every M-W-F (Hemodialysis) 10/09/23 0207  10/09/23 1000  bictegravir-emtricitabine -tenofovir  AF (BIKTARVY ) 50-200-25 MG per tablet 1 tablet        1 tablet Oral Daily 10/09/23 0028     10/09/23 1000  cefTRIAXone  (ROCEPHIN ) 2 g in sodium chloride  0.9 % 100 mL IVPB        2 g 200 mL/hr over 30 Minutes Intravenous Every 24 hours 10/09/23 0156     10/08/23 2000  vancomycin  (VANCOCIN ) IVPB 1000 mg/200 mL premix        1,000 mg 200 mL/hr over 60 Minutes Intravenous  Once 10/08/23 1958 10/08/23 2314   10/08/23 2000  piperacillin -tazobactam (ZOSYN ) IVPB 2.25 g        2.25 g 100 mL/hr over 30 Minutes Intravenous Once 10/08/23 1958 10/09/23 0851         Component Value Date/Time   SDES TISSUE 10/09/2023 1329   SPECREQUEST LEFT FINGER ABSCESS 10/09/2023 1329   CULT  10/09/2023 1329    CULTURE REINCUBATED FOR BETTER GROWTH Performed at [facility], [address], [place], [place] [address]    REPTSTATUS PENDING 10/09/2023 1329    Procedures: Procedure(s) (LRB): AMPUTATION, FINGER (Left) Medications reviewed:  Scheduled Meds:  bictegravir-emtricitabine -tenofovir  AF  1 tablet Oral Daily   Chlorhexidine  Gluconate Cloth  6 each Topical Q0600   Chlorhexidine  Gluconate Cloth  6 each Topical Q0600   [START ON 10/11/2023] heparin   2,000 Units Dialysis Once in dialysis   heparin   5,000 Units Subcutaneous Q8H   insulin  aspart  0-6 Units Subcutaneous Q4H   lidocaine   1 patch Transdermal Q24H   midodrine   10 mg Oral TID   mupirocin  ointment  1 Application Nasal BID   Continuous Infusions:  albumin  human     cefTRIAXone  (ROCEPHIN )  IV Stopped (10/09/23 1020)   vancomycin  (VANCOCIN ) 500 mg in sodium chloride  0.9 % 100 mL IVPB       Lesa Rape, MD [facility] 10/10/2023, 9:35 AM

## 2023-10-11 LAB — CULTURE, BLOOD (ROUTINE X 2)
Special Requests: ADEQUATE
Special Requests: ADEQUATE

## 2023-10-11 LAB — CBC
HCT: 31.7 % — ABNORMAL LOW (ref 39.0–52.0)
Hemoglobin: 10 g/dL — ABNORMAL LOW (ref 13.0–17.0)
MCH: 32.8 pg (ref 26.0–34.0)
MCHC: 31.5 g/dL (ref 30.0–36.0)
MCV: 103.9 fL — ABNORMAL HIGH (ref 80.0–100.0)
Platelets: 75 10*3/uL — ABNORMAL LOW (ref 150–400)
RBC: 3.05 MIL/uL — ABNORMAL LOW (ref 4.22–5.81)
RDW: 15.5 % (ref 11.5–15.5)
WBC: 12.6 10*3/uL — ABNORMAL HIGH (ref 4.0–10.5)
nRBC: 0 % (ref 0.0–0.2)

## 2023-10-11 LAB — COMPREHENSIVE METABOLIC PANEL WITH GFR
ALT: 20 U/L (ref 0–44)
AST: 31 U/L (ref 15–41)
Albumin: 2.2 g/dL — ABNORMAL LOW (ref 3.5–5.0)
Alkaline Phosphatase: 411 U/L — ABNORMAL HIGH (ref 38–126)
Anion gap: 17 — ABNORMAL HIGH (ref 5–15)
BUN: 35 mg/dL — ABNORMAL HIGH (ref 6–20)
CO2: 20 mmol/L — ABNORMAL LOW (ref 22–32)
Calcium: 9 mg/dL (ref 8.9–10.3)
Chloride: 95 mmol/L — ABNORMAL LOW (ref 98–111)
Creatinine, Ser: 6.43 mg/dL — ABNORMAL HIGH (ref 0.61–1.24)
GFR, Estimated: 10 mL/min — ABNORMAL LOW (ref >60)
Glucose, Bld: 200 mg/dL — ABNORMAL HIGH (ref 70–99)
Potassium: 4.4 mmol/L (ref 3.5–5.1)
Sodium: 132 mmol/L — ABNORMAL LOW (ref 135–145)
Total Bilirubin: 1.5 mg/dL — ABNORMAL HIGH (ref 0.0–1.2)
Total Protein: 6.7 g/dL (ref 6.5–8.1)

## 2023-10-11 LAB — GLUCOSE, CAPILLARY
Glucose-Capillary: 166 mg/dL — ABNORMAL HIGH (ref 70–99)
Glucose-Capillary: 182 mg/dL — ABNORMAL HIGH (ref 70–99)
Glucose-Capillary: 199 mg/dL — ABNORMAL HIGH (ref 70–99)
Glucose-Capillary: 228 mg/dL — ABNORMAL HIGH (ref 70–99)
Glucose-Capillary: 240 mg/dL — ABNORMAL HIGH (ref 70–99)
Glucose-Capillary: 257 mg/dL — ABNORMAL HIGH (ref 70–99)

## 2023-10-11 LAB — HEPATITIS B SURFACE ANTIBODY, QUANTITATIVE: Hep B S AB Quant (Post): 275 m[IU]/mL

## 2023-10-11 LAB — VANCOMYCIN, RANDOM: Vancomycin Rm: 21 ug/mL

## 2023-10-11 MED ORDER — INSULIN ASPART 100 UNIT/ML IJ SOLN
0.0000 [IU] | Freq: Every day | INTRAMUSCULAR | Status: DC
  Administered 2023-10-11 – 2023-10-14 (×2): 2 [IU] via SUBCUTANEOUS
  Administered 2023-10-15: 3 [IU] via SUBCUTANEOUS

## 2023-10-11 MED ORDER — HYDROXYZINE HCL 10 MG PO TABS
10.0000 mg | ORAL_TABLET | Freq: Three times a day (TID) | ORAL | Status: DC | PRN
  Administered 2023-10-11 – 2023-10-15 (×5): 10 mg via ORAL
  Filled 2023-10-11 (×5): qty 1

## 2023-10-11 MED ORDER — INSULIN ASPART PROT & ASPART (70-30 MIX) 100 UNIT/ML PEN: 5.0000 [IU] | PEN_INJECTOR | Freq: Two times a day (BID) | SUBCUTANEOUS | Status: DC

## 2023-10-11 MED ORDER — INSULIN ASPART 100 UNIT/ML IJ SOLN
0.0000 [IU] | Freq: Three times a day (TID) | INTRAMUSCULAR | Status: DC
  Administered 2023-10-11: 5 [IU] via SUBCUTANEOUS
  Administered 2023-10-11: 3 [IU] via SUBCUTANEOUS
  Administered 2023-10-12 – 2023-10-13 (×4): 2 [IU] via SUBCUTANEOUS
  Administered 2023-10-13: 1 [IU] via SUBCUTANEOUS
  Administered 2023-10-14 – 2023-10-15 (×4): 2 [IU] via SUBCUTANEOUS
  Administered 2023-10-15: 3 [IU] via SUBCUTANEOUS
  Administered 2023-10-16: 2 [IU] via SUBCUTANEOUS
  Administered 2023-10-16: 1 [IU] via SUBCUTANEOUS
  Administered 2023-10-17 (×2): 2 [IU] via SUBCUTANEOUS
  Administered 2023-10-18: 1 [IU] via SUBCUTANEOUS

## 2023-10-11 MED ORDER — INSULIN ASPART PROT & ASPART (70-30 MIX) 100 UNIT/ML ~~LOC~~ SUSP
5.0000 [IU] | Freq: Two times a day (BID) | SUBCUTANEOUS | Status: DC
  Administered 2023-10-11 – 2023-10-15 (×6): 5 [IU] via SUBCUTANEOUS
  Filled 2023-10-11: qty 10

## 2023-10-11 MED ORDER — INSULIN ASPART PROT & ASPART (70-30 MIX) 100 UNIT/ML ~~LOC~~ SUSP
5.0000 [IU] | Freq: Two times a day (BID) | SUBCUTANEOUS | Status: DC
  Filled 2023-10-11: qty 10

## 2023-10-11 NOTE — Plan of Care (Signed)
 Pt still c/o of left shoulder pain.   Problem: Education: Goal: Knowledge of General Education information will improve Description: Including pain rating scale, medication(s)/side effects and non-pharmacologic comfort measures Outcome: Progressing   Problem: Health Behavior/Discharge Planning: Goal: Ability to manage health-related needs will improve Outcome: Progressing   Problem: Clinical Measurements: Goal: Ability to maintain clinical measurements within normal limits will improve Outcome: Progressing Goal: Will remain free from infection Outcome: Progressing Goal: Diagnostic test results will improve Outcome: Progressing Goal: Respiratory complications will improve Outcome: Progressing Goal: Cardiovascular complication will be avoided Outcome: Progressing   Problem: Activity: Goal: Risk for activity intolerance will decrease Outcome: Progressing   Problem: Nutrition: Goal: Adequate nutrition will be maintained Outcome: Progressing   Problem: Coping: Goal: Level of anxiety will decrease Outcome: Progressing   Problem: Elimination: Goal: Will not experience complications related to bowel motility Outcome: Progressing Goal: Will not experience complications related to urinary retention Outcome: Progressing   Problem: Pain Managment: Goal: General experience of comfort will improve and/or be controlled Outcome: Progressing   Problem: Safety: Goal: Ability to remain free from injury will improve Outcome: Progressing   Problem: Skin Integrity: Goal: Risk for impaired skin integrity will decrease Outcome: Progressing   Problem: Education: Goal: Ability to describe self-care measures that may prevent or decrease complications (Diabetes Survival Skills Education) will improve Outcome: Progressing Goal: Individualized Educational Video(s) Outcome: Progressing   Problem: Coping: Goal: Ability to adjust to condition or change in health will improve Outcome:  Progressing   Problem: Fluid Volume: Goal: Ability to maintain a balanced intake and output will improve Outcome: Progressing   Problem: Health Behavior/Discharge Planning: Goal: Ability to identify and utilize available resources and services will improve Outcome: Progressing Goal: Ability to manage health-related needs will improve Outcome: Progressing   Problem: Metabolic: Goal: Ability to maintain appropriate glucose levels will improve Outcome: Progressing   Problem: Nutritional: Goal: Maintenance of adequate nutrition will improve Outcome: Progressing Goal: Progress toward achieving an optimal weight will improve Outcome: Progressing   Problem: Skin Integrity: Goal: Risk for impaired skin integrity will decrease Outcome: Progressing   Problem: Tissue Perfusion: Goal: Adequacy of tissue perfusion will improve Outcome: Progressing

## 2023-10-11 NOTE — Plan of Care (Signed)
  Problem: Education: Goal: Knowledge of General Education information will improve Description: Including pain rating scale, medication(s)/side effects and non-pharmacologic comfort measures Outcome: Progressing   Problem: Health Behavior/Discharge Planning: Goal: Ability to manage health-related needs will improve Outcome: Progressing   Problem: Clinical Measurements: Goal: Ability to maintain clinical measurements within normal limits will improve Outcome: Not Progressing Goal: Will remain free from infection Outcome: Progressing Goal: Diagnostic test results will improve Outcome: Progressing Goal: Respiratory complications will improve Outcome: Progressing Goal: Cardiovascular complication will be avoided Outcome: Progressing   Problem: Activity: Goal: Risk for activity intolerance will decrease Outcome: Progressing   Problem: Nutrition: Goal: Adequate nutrition will be maintained Outcome: Progressing   Problem: Coping: Goal: Level of anxiety will decrease Outcome: Progressing   Problem: Elimination: Goal: Will not experience complications related to bowel motility Outcome: Progressing Goal: Will not experience complications related to urinary retention Outcome: Progressing   Problem: Pain Managment: Goal: General experience of comfort will improve and/or be controlled Outcome: Progressing   Problem: Safety: Goal: Ability to remain free from injury will improve Outcome: Progressing   Problem: Skin Integrity: Goal: Risk for impaired skin integrity will decrease Outcome: Progressing   Problem: Coping: Goal: Ability to adjust to condition or change in health will improve Outcome: Progressing   Problem: Fluid Volume: Goal: Ability to maintain a balanced intake and output will improve Outcome: Progressing   Problem: Health Behavior/Discharge Planning: Goal: Ability to identify and utilize available resources and services will improve Outcome:  Progressing Goal: Ability to manage health-related needs will improve Outcome: Progressing   Problem: Metabolic: Goal: Ability to maintain appropriate glucose levels will improve Outcome: Progressing   Problem: Nutritional: Goal: Maintenance of adequate nutrition will improve Outcome: Progressing Goal: Progress toward achieving an optimal weight will improve Outcome: Progressing   Problem: Skin Integrity: Goal: Risk for impaired skin integrity will decrease Outcome: Progressing   Problem: Tissue Perfusion: Goal: Adequacy of tissue perfusion will improve Outcome: Progressing

## 2023-10-11 NOTE — Progress Notes (Signed)
 [facility] Progress Note   Subjective:   Seen in room. Video interpreter used. No issues with dialysis yesterday. Waiting for cathter removal.  He is complaining of L shoulder pain/arm pain.    Objective Vitals:   10/10/23 1138 10/10/23 1418 10/10/23 2100 10/11/23 0437  BP: (!) 92/57 (!) 93/52 (!) 111/59 101/88  Pulse: 81 (!) 59 82 77  Resp: 19 15 18 18   Temp: (!) 97.5 F (36.4 C) 99.1 F (37.3 C) 98.5 F (36.9 C) 98.1 F (36.7 C)  TempSrc:  Oral Oral Oral  SpO2: 100% 98% 100% 98%  Weight: 47.5 kg     Height:          Additional Objective Labs: Basic Metabolic Panel: Recent Labs  Lab 10/09/23 0631 10/10/23 0628 10/10/23 1218 10/11/23 0627  NA 134* 132* 135 132*  K 3.8 4.2 3.7 4.4  CL 101 101 95* 95*  CO2 21* 15* 21* 20*  GLUCOSE 149* 231* 131* 200*  BUN 33* 50* 15 35*  CREATININE 7.78* 9.92* 3.77* 6.43*  CALCIUM  8.5* 8.5* 8.8* 9.0  PHOS 4.5  --  2.1*  --    CBC: Recent Labs  Lab 10/08/23 1634 10/09/23 0631 10/10/23 0628 10/10/23 1218 10/11/23 0627  WBC 12.2* 12.2* 13.3* 14.2* 12.6*  NEUTROABS 10.7*  --   --   --   --   HGB 10.6* 9.5* 9.2* 9.7* 10.0*  HCT 31.2* 28.9* 27.9* 28.7* 31.7*  MCV 97.2 99.0 101.8* 99.3 103.9*  PLT 87* 74* 68* 78* 75*   Blood Culture    Component Value Date/Time   SDES TISSUE 10/09/2023 1329   SPECREQUEST LEFT FINGER ABSCESS 10/09/2023 1329   CULT  10/09/2023 1329    RARE ESCHERICHIA COLI STREPTOCOCCUS ANGINOSIS CULTURE REINCUBATED FOR BETTER GROWTH HOLDING FOR POSSIBLE ANAEROBE Performed at [facility], [address] [place], [place] [address]    REPTSTATUS PENDING 10/09/2023 1329     Physical Exam General: Alert, nad  Heart: RRR Lungs: Clear Abdomen: non-tender  Extremities: BKA;  Dialysis Access: R chest TDC in place   Medications:  vancomycin  (VANCOCIN ) 500 mg in sodium chloride  0.9 % 100 mL IVPB 500 mg (10/10/23 1341)    bictegravir-emtricitabine -tenofovir  AF  1 tablet Oral  Daily   Chlorhexidine  Gluconate Cloth  6 each Topical Q0600   Chlorhexidine  Gluconate Cloth  6 each Topical Q0600   heparin   5,000 Units Subcutaneous Q8H   insulin  aspart  0-5 Units Subcutaneous QHS   insulin  aspart  0-9 Units Subcutaneous TID WC   insulin  aspart protamine - aspart  5 Units Subcutaneous BID WC   lidocaine   1 patch Transdermal Q24H   midodrine   10 mg Oral TID   mupirocin  ointment  1 Application Nasal BID    Dialysis Orders:  NW MWF 3.5h  B400  45.5kg   TDC   Heparin  2000 Last OP HD 5/28, post wt 45.7kg Very small wt gains, always makes it to dry wt  Assessment/Plan: MRSA bacteremia.  IV Vancomycin  started. Has TDC also w finger necrosis. ID consulted. Needs line holiday. IR has been consulted to remove TDC.  Finger infection. Osteo on imaging. 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  ESRD: on HD MWF.  Continue on schedule.  Chronic hypotension: BP's at OP HD as typically low in the 80's-90s or lower during the sessions.  Volume Tolerated 1.9L UF on 5/30. UF to EDW as able.  Anemia of esrd: Hb 9-11, follow.  Secondary hyperparathyroidism: Calcium /Phos acceptable. Continue home  meds.  HIV. On Bictarvy.   Alvin Hal PA-C [facility] 10/11/2023,11:36 AM

## 2023-10-11 NOTE — Progress Notes (Signed)
 Pharmacy Antibiotic Note  Alvin Daniels is a 53 y.o. male admitted on 10/08/2023 with sepsis secondary to left fourth finger osteomyelitis and MRSA bacteremia.  Pharmacy has been consulted for vancomycin  dosing.  Today is abx d#4.  Patient received Vancomycin  loading dose 5/28 and maintenance dose 5/30 following HD session.  Random Vanc level this AM 21 mcg/ml, within goal of 15-25 mcg/ml.  Plan: Continue with Vancomycin  500mg  IV after each HD (MWF) Will follow-up HD schedule given plans for line holiday. Check vancomycin  levels as needed   Height: 4\' 11"  (149.9 cm) Weight: 47.5 kg (104 lb 11.5 oz) IBW/kg (Calculated) : 47.7  Temp (24hrs), Avg:98.1 F (36.7 C), Min:97.5 F (36.4 C), Max:99.1 F (37.3 C)  Recent Labs  Lab 10/08/23 1634 10/08/23 1818 10/08/23 2001 10/09/23 0631 10/10/23 0628 10/10/23 1218 10/11/23 0627  WBC 12.2*  --   --  12.2* 13.3* 14.2* 12.6*  CREATININE 6.93*  --   --  7.78* 9.92* 3.77* 6.43*  LATICACIDVEN  --  1.3 0.9  --   --   --   --   VANCORANDOM  --   --   --   --   --   --  21    Estimated Creatinine Clearance: 9 mL/min (A) (by C-G formula based on SCr of 6.43 mg/dL (H)).    No Known Allergies  Antimicrobials this admission: 5/28 Zosyn  x 1 5/28 Vanc >> 5/29 Ceftriaxone  >> 5/30  Dose adjustments this admission:  Microbiology results: 5/28 BCx: MRSA 5/29 L finger abscess: abd GPC, GNR  Thank you for allowing pharmacy to be a part of this patient's care.  [employer], Pharm.D., BCPS Clinical Pharmacist  **Pharmacist phone directory can be found on [email or web address] listed under [facility].  10/11/2023 9:03 AM

## 2023-10-11 NOTE — Progress Notes (Signed)
 PROGRESS NOTE Alvin Daniels  KVQ:[id number] DOB: 1970-11-01 DOA: 10/08/2023 PCP: [name], MD  Brief Narrative/Hospital Course: a 53 y.o. male with medical history significant for ESRD on HD MWF, uncontrolled type 2 diabetes, HIV, chronic hepatitis C, and s/p bilateral BKA who presented to the ED for evaluation of nausea/vomiting and cramping abdominal pain for a week feeling feverish since Saturday, having pain in his low back afebrile and lower abdomen and admitted for sepsis secondary to left fourth finger osteomyelitis. In the ED hypotensive 78/52, not hypoxic.  Labs showed hypokalemia 3.1, normal lactic acid 1.3> 0.9, leukocytosis 12 anemia 10.6 thrombocytopenia 87 MRSA PCR positive , elevated but flat troponin 53> 54 Blood culture sent,, Started on vancomycin  and ceftriaxone  Blood cx 4/4 MRSA- antibiotics changed to vanco alone. 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  Subjective: Seen and examined, Overnight vitals/labs/events reviewed  BP remains soft.  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 BP soft chronically in 70s to 80s, lactic acid normal. MRSA sepsis/bacteremia source HD cath versus left finger. ID ortho nephrology following Post HD 5/3- Line holiday planned IR consulted. TDC still in place Echocardiogram pending will likely need TEE. Continue contact isolation  Chronic hypotension Continues to have low BP with SBP in the 80-90s> changed midodrine  to 3 times daily from prn.  Elevated alk phos Gallbladder sludge: ALP 261, mildly elevated AST to 62 and  bilirubin of 1.4 RUQ U/S shows some gallbladder sludge but no acute cholecystitis, CBD minimally increased from 2021 No RUQ pain.  Trend labs-AST ALT remains normal improved.  Alk phos high, trend    ESRD on HD MWF Anemia of chronic disease Mild hypokalemia Metabolic bone disease: Nephrology following continue HD as #1 with plan for line holiday  For HD 5/30    Type 2 diabetes with hyperglycemiaL Last A1c 10.0% on 07/17/2023, PTA on NovoLog  70/30 15 units twice daily at home> on SSI ACHS Recent Labs  Lab 10/10/23 1520 10/10/23 2101 10/11/23 0008 10/11/23 0439 10/11/23 0719  GLUCAP 133* 232* 166* 182* 199*   HIV Last CD4 count 315, viral load 27 on 04/16/23 now < 20, CD4 254 from 315. Continue Biktarvy  per ID  Thrombocytopenia, chronic: Monitor platelet counts.   Bilateral BKA Uses prosthetics   DVT prophylaxis: heparin  injection 5,000 Units Start: 10/09/23 0030 Code Status:   Code Status: Full Code Family Communication: plan of care discussed with patient at bedside. Patient status is: Remains hospitalized because of severity of illness Level of care: Telemetry Medical   Dispo: The patient is from: home            Anticipated disposition: TBD Objective: Vitals last 24 hrs: Vitals:   10/10/23 1138 10/10/23 1418 10/10/23 2100 10/11/23 0437  BP: (!) 92/57 (!) 93/52 (!) 111/59 101/88  Pulse: 81 (!) 59 82 77  Resp: 19 15 18 18   Temp: (!) 97.5 F (36.4 C) 99.1 F (37.3 C) 98.5 F (36.9 C) 98.1 F (36.7 C)  TempSrc:  Oral Oral Oral  SpO2: 100% 98% 100% 98%  Weight: 47.5 kg     Height:        Physical  Examination: General exam: alert awake, oriented at baseline, older than stated age HEENT:Oral mucosa moist, Ear/Nose WNL grossly Respiratory system: Bilaterally clear BS,no use of accessory muscle, RT CHEST TDC+ W/ DRESSING Cardiovascular system: S1 & S2 +, No JVD. Gastrointestinal system: Abdomen soft,NT,ND, BS+ Nervous System: Alert, awake, moving all  extremities,and following commands. Extremities: B/L bka Skin: No rashes,no icterus. MSK: Normal muscle bulk,tone, power   Data Reviewed: I have personally reviewed following labs and imaging studies ( see epic result tab) CBC: Recent Labs  Lab 10/08/23 1634 10/09/23 0631 10/10/23 0628 10/10/23 1218 10/11/23 0627  WBC 12.2* 12.2* 13.3* 14.2* 12.6*  NEUTROABS 10.7*  --   --   --   --   HGB 10.6* 9.5* 9.2* 9.7* 10.0*  HCT 31.2* 28.9* 27.9* 28.7* 31.7*  MCV 97.2 99.0 101.8* 99.3 103.9*  PLT 87* 74* 68* 78* 75*   CMP: Recent Labs  Lab 10/08/23 1634 10/08/23 1947 10/09/23 0631 10/10/23 0628 10/10/23 1218 10/11/23 0627  NA 136  --  134* 132* 135 132*  K 3.1*  --  3.8 4.2 3.7 4.4  CL 95*  --  101 101 95* 95*  CO2 26  --  21* 15* 21* 20*  GLUCOSE 79  --  149* 231* 131* 200*  BUN 28*  --  33* 50* 15 35*  CREATININE 6.93*  --  7.78* 9.92* 3.77* 6.43*  CALCIUM  8.9  --  8.5* 8.5* 8.8* 9.0  MG  --  2.1  --   --   --   --   PHOS  --   --  4.5  --  2.1*  --    GFR: Estimated Creatinine Clearance: 9 mL/min (A) (by C-G formula based on SCr of 6.43 mg/dL (H)). Recent Labs  Lab 10/08/23 1634 10/09/23 0631 10/10/23 0628 10/10/23 1218 10/11/23 0627  AST 52*  --  31  --  31  ALT 34  --  22  --  20  ALKPHOS 261*  --  315*  --  411*  BILITOT 1.4*  --  1.3*  --  1.5*  PROT 7.9  --  6.7  --  6.7  ALBUMIN  2.7* 2.3* 2.3* 2.6* 2.2*    Recent Labs  Lab 10/08/23 1634  LIPASE 32   No results for input(s): "AMMONIA" in the last 168 hours. Coagulation Profile:  Recent Labs  Lab 10/08/23 1947  INR 1.2   Unresulted Labs (From admission, onward)     Start     Ordered   10/10/23 0500  Comprehensive metabolic panel with GFR  Daily,   R      10/09/23 0833   10/10/23 0500  CBC  Daily,   R      10/09/23 1610   10/08/23 1634  Urinalysis, Routine w reflex microscopic -Urine, Clean Catch  (ED Abdominal Pain)  Once,   URGENT       Question:  Specimen Source  Answer:  Urine, Clean Catch    10/08/23 1633           Antimicrobials/Microbiology: Anti-infectives (From admission, onward)    Start     Dose/Rate Route Frequency Ordered Stop   10/10/23 1200  vancomycin  (VANCOCIN ) 500 mg in sodium chloride  0.9 % 100 mL IVPB        500 mg 110 mL/hr over 60 Minutes Intravenous Every M-W-F (Hemodialysis) 10/09/23 0207     10/09/23 1000  bictegravir-emtricitabine -tenofovir  AF (BIKTARVY ) 50-200-25 MG per tablet 1 tablet  1 tablet Oral Daily 10/09/23 0028     10/09/23 1000  cefTRIAXone  (ROCEPHIN ) 2 g in sodium chloride  0.9 % 100 mL IVPB  Status:  Discontinued        2 g 200 mL/hr over 30 Minutes Intravenous Every 24 hours 10/09/23 0156 10/10/23 1557   10/08/23 2000  vancomycin  (VANCOCIN ) IVPB 1000 mg/200 mL premix        1,000 mg 200 mL/hr over 60 Minutes Intravenous  Once 10/08/23 1958 10/08/23 2314   10/08/23 2000  piperacillin -tazobactam (ZOSYN ) IVPB 2.25 g        2.25 g 100 mL/hr over 30 Minutes Intravenous Once 10/08/23 1958 10/09/23 0851         Component Value Date/Time   SDES TISSUE 10/09/2023 1329   SPECREQUEST LEFT FINGER ABSCESS 10/09/2023 1329   CULT  10/09/2023 1329    RARE ESCHERICHIA COLI STREPTOCOCCUS ANGINOSIS CULTURE REINCUBATED FOR BETTER GROWTH Performed at [facility], [address], [place], [place] [address]    REPTSTATUS PENDING 10/09/2023 1329    Procedures: Procedure(s) (LRB): AMPUTATION, FINGER (Left) Medications reviewed:  Scheduled Meds:  bictegravir-emtricitabine -tenofovir  AF  1 tablet Oral Daily   Chlorhexidine  Gluconate Cloth  6 each Topical Q0600   Chlorhexidine  Gluconate Cloth  6 each Topical Q0600   heparin   5,000 Units Subcutaneous Q8H   insulin  aspart  0-5 Units Subcutaneous QHS   insulin  aspart  0-9 Units Subcutaneous TID WC   lidocaine   1 patch Transdermal Q24H   midodrine   10 mg Oral TID   mupirocin  ointment  1 Application Nasal BID   Continuous Infusions:  vancomycin  (VANCOCIN ) 500 mg in sodium chloride  0.9  % 100 mL IVPB 500 mg (10/10/23 1341)    Lesa Rape, MD [facility] 10/11/2023, 9:35 AM

## 2023-10-12 ENCOUNTER — Inpatient Hospital Stay (HOSPITAL_COMMUNITY): Payer: Self-pay

## 2023-10-12 DIAGNOSIS — I38 Endocarditis, valve unspecified: Secondary | ICD-10-CM

## 2023-10-12 HISTORY — PX: IR REMOVAL TUN CV CATH W/O FL: IMG2289

## 2023-10-12 LAB — CBC
HCT: 32.1 % — ABNORMAL LOW (ref 39.0–52.0)
Hemoglobin: 11.1 g/dL — ABNORMAL LOW (ref 13.0–17.0)
MCH: 33.3 pg (ref 26.0–34.0)
MCHC: 34.6 g/dL (ref 30.0–36.0)
MCV: 96.4 fL (ref 80.0–100.0)
Platelets: 83 10*3/uL — ABNORMAL LOW (ref 150–400)
RBC: 3.33 MIL/uL — ABNORMAL LOW (ref 4.22–5.81)
RDW: 14.6 % (ref 11.5–15.5)
WBC: 11.9 10*3/uL — ABNORMAL HIGH (ref 4.0–10.5)
nRBC: 0 % (ref 0.0–0.2)

## 2023-10-12 LAB — ECHOCARDIOGRAM COMPLETE
AR max vel: 2.64 cm2
AV Area VTI: 2.54 cm2
AV Area mean vel: 2.36 cm2
AV Mean grad: 3 mmHg
AV Peak grad: 5.4 mmHg
Ao pk vel: 1.16 m/s
Area-P 1/2: 2.69 cm2
Calc EF: 64 %
Height: 59 in
S' Lateral: 2.45 cm
Single Plane A2C EF: 69.4 %
Single Plane A4C EF: 58.1 %
Weight: 1675.5 [oz_av]

## 2023-10-12 LAB — COMPREHENSIVE METABOLIC PANEL WITH GFR
ALT: 21 U/L (ref 0–44)
AST: 33 U/L (ref 15–41)
Albumin: 2.2 g/dL — ABNORMAL LOW (ref 3.5–5.0)
Alkaline Phosphatase: 488 U/L — ABNORMAL HIGH (ref 38–126)
Anion gap: 12 (ref 5–15)
BUN: 58 mg/dL — ABNORMAL HIGH (ref 6–20)
CO2: 22 mmol/L (ref 22–32)
Calcium: 9.1 mg/dL (ref 8.9–10.3)
Chloride: 94 mmol/L — ABNORMAL LOW (ref 98–111)
Creatinine, Ser: 8 mg/dL — ABNORMAL HIGH (ref 0.61–1.24)
GFR, Estimated: 7 mL/min — ABNORMAL LOW (ref >60)
Glucose, Bld: 226 mg/dL — ABNORMAL HIGH (ref 70–99)
Potassium: 3.9 mmol/L (ref 3.5–5.1)
Sodium: 128 mmol/L — ABNORMAL LOW (ref 135–145)
Total Bilirubin: 1.4 mg/dL — ABNORMAL HIGH (ref 0.0–1.2)
Total Protein: 6.9 g/dL (ref 6.5–8.1)

## 2023-10-12 LAB — GLUCOSE, CAPILLARY
Glucose-Capillary: 191 mg/dL — ABNORMAL HIGH (ref 70–99)
Glucose-Capillary: 214 mg/dL — ABNORMAL HIGH (ref 70–99)
Glucose-Capillary: 175 mg/dL — ABNORMAL HIGH (ref 70–99)
Glucose-Capillary: 155 mg/dL — ABNORMAL HIGH (ref 70–99)

## 2023-10-12 MED ORDER — LIDOCAINE HCL 1 % IJ SOLN
INTRAMUSCULAR | Status: AC
  Filled 2023-10-12: qty 20

## 2023-10-12 MED ORDER — VANCOMYCIN VARIABLE DOSE PER UNSTABLE RENAL FUNCTION (PHARMACIST DOSING): Status: DC

## 2023-10-12 MED ORDER — LIDOCAINE HCL (PF) 1 % IJ SOLN
10.0000 mL | Freq: Once | INTRAMUSCULAR | Status: DC
  Filled 2023-10-12: qty 10

## 2023-10-12 NOTE — Progress Notes (Signed)
 PROGRESS NOTE Alvin Daniels  ZOX:[id number] DOB: 05-03-1971 DOA: 10/08/2023 PCP: [name], MD  Brief Narrative/Hospital Course: a 53 y.o. male with medical history significant for ESRD on HD MWF, uncontrolled type 2 diabetes, HIV, chronic hepatitis C, and s/p bilateral BKA who presented to the ED for evaluation of nausea/vomiting and cramping abdominal pain for a week feeling feverish since Saturday, having pain in his low back afebrile and lower abdomen and admitted for sepsis secondary to left fourth finger osteomyelitis. In the ED hypotensive 78/52, not hypoxic.  Labs showed hypokalemia 3.1, normal lactic acid 1.3> 0.9, leukocytosis 12 anemia 10.6 thrombocytopenia 87 MRSA PCR positive , elevated but flat troponin 53> 54 Blood culture sent,, Started on vancomycin  and ceftriaxone  Blood cx 4/4 MRSA- antibiotics changed to vanco alone. 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 BP soft chronically in 70s to 80s, lactic acid normal. MRSA sepsis/bacteremia source HD cath versus left finger. ID ortho nephrology following Post HD 5/3- Line holiday planned IR consulted. TDC still in place-patient refused to go down on Friday to remove HD catheter- Messaged IR today who will plan to reattempt Monday. Echocardiogram pending will likely need TEE-defer to ID. Continue contact isolation  Chronic  hypotension Continues to have low BP with SBP in the 80-90s> Cont midodrine   TID, PTA on prn  Elevated alk phos Gallbladder sludge: ALP 261, mildly elevated AST to 62 and bilirubin of 1.4 RUQ U/S shows some gallbladder sludge but no acute cholecystitis, CBD minimally increased from 2021 No RUQ pain.  Trend labs-AST ALT remains normal improved.  Alk phos high, trend    ESRD on HD MWF Anemia of chronic disease Mild hypokalemia Metabolic bone disease: Nephrology following continue HD as #1 with plan for line holiday post HD. HD done Friday.   Type 2 diabetes with hyperglycemia: Last A1c 10.0% on 07/17/2023, PTA on NovoLog  70/30 at 15 units bid- cont at lower dose cont SSI ACHS Recent Labs  Lab 10/10/23 1520 10/10/23 2101 10/11/23 0008 10/11/23 0439 10/11/23 0719  GLUCAP 133* 232* 166* 182* 199*   HIV: Last CD4 count 315, viral load 27 on 04/16/23 now < 20, CD4 254 from 315. Continue Biktarvy  per ID  Thrombocytopenia, chronic: Monitor platelet counts.   Bilateral BKA Cont b/l prosthetics   Discussed plan of care with IR nephrology and ID.  DVT prophylaxis: heparin  injection 5,000 Units Start: 10/09/23 0030 Code Status:   Code Status: Full Code Family Communication: plan of care discussed with patient at bedside. Patient status is: Remains hospitalized because of severity of illness Level of care: Telemetry Medical   Dispo: The patient is from: home            Anticipated disposition: TBD Objective: Vitals last 24 hrs: Vitals:   10/11/23 1556 10/11/23 1940 10/12/23 0442 10/12/23 0751  BP: 97/67 116/72 106/68 109/70  Pulse: 81 75 76   Resp: 18 18 18 10   Temp: (!) 97.5 F (36.4 C) 97.7 F (36.5 C)  98.1 F (36.7 C) 98.3 F (36.8 C)  TempSrc: Oral Oral Oral Oral  SpO2: 100% 99% 92% 94%  Weight:      Height:        Physical Examination: General exam: alert awake, oriented  HEENT:Oral mucosa moist, Ear/Nose WNL grossly Respiratory system: Bilaterally clear BS,no use  of accessory muscle Cardiovascular system: S1 & S2 +, No JVD. Hd cathere rt chest w/ dressing intact Gastrointestinal system: Abdomen soft,NT,ND, BS+ Nervous System: Alert, awake, moving all extremities,and following commands. Extremities: b/l bka Skin: No rashes,no icterus. MSK: Normal muscle bulk,tone, power  Data Reviewed: I have personally reviewed following labs and imaging studies ( see epic result tab) CBC: Recent Labs  Lab 10/08/23 1634 10/09/23 0631 10/10/23 1610 10/10/23 1218 10/11/23 0627 10/12/23 0608  WBC 12.2* 12.2* 13.3* 14.2* 12.6* 11.9*  NEUTROABS 10.7*  --   --   --   --   --   HGB 10.6* 9.5* 9.2* 9.7* 10.0* 11.1*  HCT 31.2* 28.9* 27.9* 28.7* 31.7* 32.1*  MCV 97.2 99.0 101.8* 99.3 103.9* 96.4  PLT 87* 74* 68* 78* 75* 83*   CMP: Recent Labs  Lab 10/08/23 1947 10/09/23 0631 10/10/23 0628 10/10/23 1218 10/11/23 0627 10/12/23 0608  NA  --  134* 132* 135 132* 128*  K  --  3.8 4.2 3.7 4.4 3.9  CL  --  101 101 95* 95* 94*  CO2  --  21* 15* 21* 20* 22  GLUCOSE  --  149* 231* 131* 200* 226*  BUN  --  33* 50* 15 35* 58*  CREATININE  --  7.78* 9.92* 3.77* 6.43* 8.00*  CALCIUM   --  8.5* 8.5* 8.8* 9.0 9.1  MG 2.1  --   --   --   --   --   PHOS  --  4.5  --  2.1*  --   --    GFR: Estimated Creatinine Clearance: 7.3 mL/min (A) (by C-G formula based on SCr of 8 mg/dL (H)). Recent Labs  Lab 10/08/23 1634 10/09/23 0631 10/10/23 9604 10/10/23 1218 10/11/23 0627 10/12/23 0608  AST 52*  --  31  --  31 33  ALT 34  --  22  --  20 21  ALKPHOS 261*  --  315*  --  411* 488*  BILITOT 1.4*  --  1.3*  --  1.5* 1.4*  PROT 7.9  --  6.7  --  6.7 6.9  ALBUMIN  2.7* 2.3* 2.3* 2.6* 2.2* 2.2*    Recent Labs  Lab 10/08/23 1634  LIPASE 32   No results for input(s): "AMMONIA" in the last 168 hours. Coagulation Profile:  Recent Labs  Lab 10/08/23 1947  INR 1.2   Unresulted Labs (From admission, onward)     Start     Ordered   10/10/23 0500  Comprehensive metabolic  panel with GFR  Daily,   R      10/09/23 0833   10/10/23 0500  CBC  Daily,   R      10/09/23 0833   10/08/23 1634  Urinalysis, Routine w reflex microscopic -Urine, Clean Catch  (ED Abdominal Pain)  Once,   URGENT       Question:  Specimen Source  Answer:  Urine, Clean Catch   10/08/23 1633           Antimicrobials/Microbiology: Anti-infectives (From admission, onward)    Start     Dose/Rate Route Frequency Ordered Stop   10/12/23 0758  vancomycin  variable dose  per unstable renal function (pharmacist dosing)         Does not apply See admin instructions 10/12/23 0758     10/10/23 1200  vancomycin  (VANCOCIN ) 500 mg in sodium chloride  0.9 % 100 mL IVPB  Status:  Discontinued        500 mg 110 mL/hr over 60 Minutes Intravenous Every M-W-F (Hemodialysis) 10/09/23 0207 10/12/23 0758   10/09/23 1000  bictegravir-emtricitabine -tenofovir  AF (BIKTARVY ) 50-200-25 MG per tablet 1 tablet        1 tablet Oral Daily 10/09/23 0028     10/09/23 1000  cefTRIAXone  (ROCEPHIN ) 2 g in sodium chloride  0.9 % 100 mL IVPB  Status:  Discontinued        2 g 200 mL/hr over 30 Minutes Intravenous Every 24 hours 10/09/23 0156 10/10/23 1557   10/08/23 2000  vancomycin  (VANCOCIN ) IVPB 1000 mg/200 mL premix        1,000 mg 200 mL/hr over 60 Minutes Intravenous  Once 10/08/23 1958 10/08/23 2314   10/08/23 2000  piperacillin -tazobactam (ZOSYN ) IVPB 2.25 g        2.25 g 100 mL/hr over 30 Minutes Intravenous Once 10/08/23 1958 10/09/23 0851         Component Value Date/Time   SDES TISSUE 10/09/2023 1329   SPECREQUEST LEFT FINGER ABSCESS 10/09/2023 1329   CULT  10/09/2023 1329    RARE ESCHERICHIA COLI STREPTOCOCCUS ANGINOSIS CONFIRMATION OF SUSCEPTIBILITIES IN PROGRESS HOLDING FOR POSSIBLE ANAEROBE Performed at [facility], [address], [place], [place] [address]    REPTSTATUS PENDING 10/09/2023 1329    Procedures: Procedure(s) (LRB): AMPUTATION, FINGER (Left) Medications reviewed:  Scheduled  Meds:  bictegravir-emtricitabine -tenofovir  AF  1 tablet Oral Daily   Chlorhexidine  Gluconate Cloth  6 each Topical Q0600   Chlorhexidine  Gluconate Cloth  6 each Topical Q0600   heparin   5,000 Units Subcutaneous Q8H   insulin  aspart  0-5 Units Subcutaneous QHS   insulin  aspart  0-9 Units Subcutaneous TID WC   insulin  aspart protamine - aspart  5 Units Subcutaneous BID WC   lidocaine   1 patch Transdermal Q24H   midodrine   10 mg Oral TID   mupirocin  ointment  1 Application Nasal BID   vancomycin  variable dose per unstable renal function (pharmacist dosing)   Does not apply See admin instructions   Continuous Infusions:    Lesa Rape, MD [facility] 10/12/2023, 10:23 AM

## 2023-10-12 NOTE — Progress Notes (Signed)
 Pharmacy Antibiotic Note  Alvin Daniels is a 53 y.o. male admitted on 10/08/2023 with sepsis secondary to left fourth finger osteomyelitis and MRSA bacteremia.    Noted plans for HD line removal and line holiday.  HD may be off MWF schedule given need to line holiday.  Will discontinue standing Vanc order.  Pharmacy will continue to follow and dose Vanc intermittently following HD sessions until routine schedule resumed.  [employer], Pharm.D., BCPS Clinical Pharmacist  **Pharmacist phone directory can be found on [email or web address] listed under [facility].  10/12/2023 7:57 AM

## 2023-10-12 NOTE — Plan of Care (Signed)

## 2023-10-12 NOTE — Procedures (Signed)
 Successful removal of right IJ tunneled HD catheter.   After obtaining consent and performing a time-out, the right upper chest was prepped and draped in the normal sterile fashion. The heparin  was removed from both ports. 1% lidocaine  was used for local anesthesia. Using gentle blunt dissection and mild manual traction the cuff of the catheter was exposed and the catheter was removed in its entirety. Pressure was held until hemostasis was obtained. A sterile dressing was applied. The patient tolerated the procedure well with no immediate complications.   Brendaly Townsel, AGACNP-BC 10/12/2023, 12:34 PM

## 2023-10-12 NOTE — Plan of Care (Signed)
   Problem: Education: Goal: Knowledge of General Education information will improve Description Including pain rating scale, medication(s)/side effects and non-pharmacologic comfort measures Outcome: Progressing

## 2023-10-12 NOTE — Progress Notes (Signed)
 [facility] Progress Note   Subjective:   Seen in room. Video interpreter used.  Waiting for cathter removal. Has various complaints. L arm pain better but now c/o back pain. Wants cathter to come out. Denies f/c, chest pain, sob, n/v.   Objective Vitals:   10/11/23 1556 10/11/23 1940 10/12/23 0442 10/12/23 0751  BP: 97/67 116/72 106/68 109/70  Pulse: 81 75 76   Resp: 18 18 18 10   Temp: (!) 97.5 F (36.4 C) 97.7 F (36.5 C) 98.1 F (36.7 C) 98.3 F (36.8 C)  TempSrc: Oral Oral Oral Oral  SpO2: 100% 99% 92% 94%  Weight:      Height:         Additional Objective Labs: Basic Metabolic Panel: Recent Labs  Lab 10/09/23 0631 10/10/23 0628 10/10/23 1218 10/11/23 0627 10/12/23 0608  NA 134*   < > 135 132* 128*  K 3.8   < > 3.7 4.4 3.9  CL 101   < > 95* 95* 94*  CO2 21*   < > 21* 20* 22  GLUCOSE 149*   < > 131* 200* 226*  BUN 33*   < > 15 35* 58*  CREATININE 7.78*   < > 3.77* 6.43* 8.00*  CALCIUM  8.5*   < > 8.8* 9.0 9.1  PHOS 4.5  --  2.1*  --   --    < > = values in this interval not displayed.   CBC: Recent Labs  Lab 10/08/23 1634 10/09/23 0631 10/10/23 1610 10/10/23 1218 10/11/23 0627 10/12/23 0608  WBC 12.2* 12.2* 13.3* 14.2* 12.6* 11.9*  NEUTROABS 10.7*  --   --   --   --   --   HGB 10.6* 9.5* 9.2* 9.7* 10.0* 11.1*  HCT 31.2* 28.9* 27.9* 28.7* 31.7* 32.1*  MCV 97.2 99.0 101.8* 99.3 103.9* 96.4  PLT 87* 74* 68* 78* 75* 83*   Blood Culture    Component Value Date/Time   SDES TISSUE 10/09/2023 1329   SPECREQUEST LEFT FINGER ABSCESS 10/09/2023 1329   CULT  10/09/2023 1329    RARE ESCHERICHIA COLI STREPTOCOCCUS ANGINOSIS CONFIRMATION OF SUSCEPTIBILITIES IN PROGRESS HOLDING FOR POSSIBLE ANAEROBE Performed at [facility], [address], [place], [place] [address]    REPTSTATUS PENDING 10/09/2023 1329     Physical Exam General: Alert, nad  Heart: RRR Lungs: Clear Abdomen: non-tender  Extremities: BKA; no stump edema   Dialysis Access: R chest TDC in place   Medications:    bictegravir-emtricitabine -tenofovir  AF  1 tablet Oral Daily   Chlorhexidine  Gluconate Cloth  6 each Topical Q0600   Chlorhexidine  Gluconate Cloth  6 each Topical Q0600   heparin   5,000 Units Subcutaneous Q8H   insulin  aspart  0-5 Units Subcutaneous QHS   insulin  aspart  0-9 Units Subcutaneous TID WC   insulin  aspart protamine - aspart  5 Units Subcutaneous BID WC   lidocaine   1 patch Transdermal Q24H   midodrine   10 mg Oral TID   mupirocin  ointment  1 Application Nasal BID   vancomycin  variable dose per unstable renal function (pharmacist dosing)   Does not apply See admin instructions    Dialysis Orders:  [facility] MWF 3.5h  B400  45.5kg   TDC   Heparin  2000 Last OP HD 5/28, post wt 45.7kg Very small wt gains, always makes it to dry wt  Assessment/Plan: MRSA bacteremia.  IV Vancomycin  started. Had [facility] in place,  also w finger necrosis. ID consulted. Needs line holiday. IR has been  consulted to remove TDC. We spoke with IR today, they are aware of consult.  Finger infection. Osteo on imaging. 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  ESRD: on HD MWF.  Last HD 5/30. Plan for line holiday. Resume HD after line holiday.  Chronic hypotension: BP's at OP HD as typically low in the 80's-90s or lower during the sessions.  Volume Tolerated 1.9L UF on 5/30. UF to EDW as able.  Anemia of esrd: Hgb 10-11, follow.  Secondary hyperparathyroidism: Calcium /Phos acceptable. Continue home meds.  HIV. On Bictarvy.   Elona Hal PA-C [facility] 10/12/2023,10:16 AM

## 2023-10-12 NOTE — Progress Notes (Signed)
*  PRELIMINARY RESULTS* Echocardiogram 2D Echocardiogram has been performed.  Alvin Daniels 10/12/2023, 11:23 AM

## 2023-10-13 ENCOUNTER — Inpatient Hospital Stay (HOSPITAL_COMMUNITY): Payer: Self-pay

## 2023-10-13 LAB — COMPREHENSIVE METABOLIC PANEL WITH GFR
ALT: 18 U/L (ref 0–44)
AST: 27 U/L (ref 15–41)
Albumin: 2.1 g/dL — ABNORMAL LOW (ref 3.5–5.0)
Alkaline Phosphatase: 432 U/L — ABNORMAL HIGH (ref 38–126)
Anion gap: 19 — ABNORMAL HIGH (ref 5–15)
BUN: 79 mg/dL — ABNORMAL HIGH (ref 6–20)
CO2: 17 mmol/L — ABNORMAL LOW (ref 22–32)
Calcium: 9.1 mg/dL (ref 8.9–10.3)
Chloride: 93 mmol/L — ABNORMAL LOW (ref 98–111)
Creatinine, Ser: 9.92 mg/dL — ABNORMAL HIGH (ref 0.61–1.24)
GFR, Estimated: 6 mL/min — ABNORMAL LOW (ref >60)
Glucose, Bld: 198 mg/dL — ABNORMAL HIGH (ref 70–99)
Potassium: 4.5 mmol/L (ref 3.5–5.1)
Sodium: 129 mmol/L — ABNORMAL LOW (ref 135–145)
Total Bilirubin: 1.2 mg/dL (ref 0.0–1.2)
Total Protein: 6.8 g/dL (ref 6.5–8.1)

## 2023-10-13 LAB — GLUCOSE, CAPILLARY
Glucose-Capillary: 174 mg/dL — ABNORMAL HIGH (ref 70–99)
Glucose-Capillary: 140 mg/dL — ABNORMAL HIGH (ref 70–99)
Glucose-Capillary: 150 mg/dL — ABNORMAL HIGH (ref 70–99)
Glucose-Capillary: 187 mg/dL — ABNORMAL HIGH (ref 70–99)
Glucose-Capillary: 188 mg/dL — ABNORMAL HIGH (ref 70–99)

## 2023-10-13 LAB — CBC
HCT: 32.7 % — ABNORMAL LOW (ref 39.0–52.0)
Hemoglobin: 11.1 g/dL — ABNORMAL LOW (ref 13.0–17.0)
MCH: 32.8 pg (ref 26.0–34.0)
MCHC: 33.9 g/dL (ref 30.0–36.0)
MCV: 96.7 fL (ref 80.0–100.0)
Platelets: 91 10*3/uL — ABNORMAL LOW (ref 150–400)
RBC: 3.38 MIL/uL — ABNORMAL LOW (ref 4.22–5.81)
RDW: 14.9 % (ref 11.5–15.5)
WBC: 13.8 10*3/uL — ABNORMAL HIGH (ref 4.0–10.5)
nRBC: 0 % (ref 0.0–0.2)

## 2023-10-13 LAB — AEROBIC/ANAEROBIC CULTURE W GRAM STAIN (SURGICAL/DEEP WOUND): Gram Stain: NONE SEEN

## 2023-10-13 MED ORDER — HYDROCODONE-ACETAMINOPHEN 5-325 MG PO TABS
1.0000 | ORAL_TABLET | Freq: Four times a day (QID) | ORAL | Status: DC | PRN
  Administered 2023-10-13 – 2023-10-18 (×11): 1 via ORAL
  Filled 2023-10-13 (×12): qty 1

## 2023-10-13 MED ORDER — CHLORHEXIDINE GLUCONATE CLOTH 2 % EX PADS: 6.0000 | MEDICATED_PAD | Freq: Every day | CUTANEOUS | Status: DC

## 2023-10-13 MED ORDER — SODIUM CHLORIDE 0.9% FLUSH: 3.0000 mL | INTRAVENOUS | Status: DC | PRN

## 2023-10-13 MED ORDER — SODIUM CHLORIDE 0.9% FLUSH
3.0000 mL | Freq: Two times a day (BID) | INTRAVENOUS | Status: DC
  Administered 2023-10-13 – 2023-10-14 (×3): 3 mL via INTRAVENOUS

## 2023-10-13 NOTE — Progress Notes (Signed)
 [facility]  Date of Admission:  10/08/2023     Reason for Follow Up: Sepsis [facility])  Total days of antibiotics 6         ASSESSMENT:  Alvin Daniels is POD #4 from left middle finger amputation in the setting of MRSA bacteremia. Obtain blood cultures for clearance of bacteremia. C/o of right sided hip pain and will obtain imaging to rule out possibility of infection. TTE without vegetation and preserved valve function. TEE ordered to rule out endocarditis given MRSA bacteremia. Source of infection unclear although AV fistula appears okay at this point. Continue current dose of vancomycin . Continue post-operative wound care per Orthopedics and dialysis per Nephrology. Continue Biktarvy  for ART. Contact precautions for MRSA. Remaining medical and supportive care per Internal Medicine.  PLAN:  Continue current dose of vancomycin  Continue Biktarvy  for ART Post-operative wound care per Orthopedics.  Image right hip for any evidence of infection.  TEE to check for endocarditis. Blood cultures for clearance of bacteremia. Dialysis per Nephrology. Contact precautions for MRSA Remaining medical and supportive care per Internal Medicine.   Principal Problem:   Sepsis ([name]) Active Problems:   Hypokalemia   Osteomyelitis of left hand ([name])   Biliary sludge determined by ultrasound    bictegravir-emtricitabine -tenofovir  AF  1 tablet Oral Daily   Chlorhexidine  Gluconate Cloth  6 each Topical Q0600   heparin   5,000 Units Subcutaneous Q8H   insulin  aspart  0-5 Units Subcutaneous QHS   insulin  aspart  0-9 Units Subcutaneous TID WC   insulin  aspart protamine - aspart  5 Units Subcutaneous BID WC   lidocaine   1 patch Transdermal Q24H   lidocaine  (PF)  10 mL Infiltration Once   midodrine   10 mg Oral TID   mupirocin  ointment  1 Application Nasal BID   vancomycin  variable dose per unstable renal function (pharmacist dosing)   Does not apply See admin instructions     SUBJECTIVE:  Afebrile overnight with no acute events. Having pain in right hip. Tolerating antibiotics with no adverse side effects.   No Known Allergies   Review of Systems: Review of Systems  Constitutional:  Negative for chills, fever and weight loss.  Respiratory:  Negative for cough, shortness of breath and wheezing.   Cardiovascular:  Negative for chest pain and leg swelling.  Gastrointestinal:  Negative for abdominal pain, constipation, diarrhea, nausea and vomiting.  Musculoskeletal:        Positive for right hip pain  Skin:  Negative for rash.      OBJECTIVE: Vitals:   10/12/23 0751 10/12/23 1930 10/13/23 0419 10/13/23 0744  BP: 109/70 (!) 95/55 100/65 116/70  Pulse:  89 83 78  Resp: 10 16 16 18   Temp: 98.3 F (36.8 C) 98.5 F (36.9 C) 98.3 F (36.8 C) (!) 97.5 F (36.4 C)  TempSrc: Oral Oral    SpO2: 94% 94% 96% 98%  Weight:      Height:       Body mass index is 21.15 kg/m.  Physical Exam Constitutional:      General: He is not in acute distress.    Appearance: He is well-developed.     Comments: Seated on the side of the bed eating lunch; pleasant.   Cardiovascular:     Rate and Rhythm: Normal rate and regular rhythm.     Heart sounds: Normal heart sounds.  Pulmonary:     Effort: Pulmonary effort is normal.     Breath sounds: Normal breath sounds.  Skin:    General: Skin is warm and dry.  Neurological:     Mental Status: He is alert.     Lab Results Lab Results  Component Value Date   WBC 13.8 (H) 10/13/2023   HGB 11.1 (L) 10/13/2023   HCT 32.7 (L) 10/13/2023   MCV 96.7 10/13/2023   PLT 91 (L) 10/13/2023    Lab Results  Component Value Date   CREATININE 9.92 (H) 10/13/2023   BUN 79 (H) 10/13/2023   NA 129 (L) 10/13/2023   K 4.5 10/13/2023   CL 93 (L) 10/13/2023   CO2 17 (L) 10/13/2023    Lab Results  Component Value Date   ALT 18 10/13/2023   AST 27 10/13/2023   ALKPHOS 432 (H) 10/13/2023   BILITOT 1.2 10/13/2023      Microbiology: Recent Results (from the past 240 hours)  Blood Cultures x 2 sites     Status: Abnormal   Collection Time: 10/08/23  7:38 PM   Specimen: BLOOD RIGHT HAND  Result Value Ref Range Status   Specimen Description BLOOD RIGHT HAND  Final   Special Requests   Final    BOTTLES DRAWN AEROBIC AND ANAEROBIC Blood Culture adequate volume   Culture  Setup Time   Final    GRAM POSITIVE COCCI IN BOTH AEROBIC AND ANAEROBIC BOTTLES CRITICAL VALUE NOTED.  VALUE IS CONSISTENT WITH PREVIOUSLY REPORTED AND CALLED VALUE.    Culture (A)  Final    STAPHYLOCOCCUS AUREUS SUSCEPTIBILITIES PERFORMED ON PREVIOUS CULTURE WITHIN THE LAST 5 DAYS. Performed at [facility], [address], [place], [place] [address]    Report Status 10/11/2023 FINAL  Final  Blood Cultures x 2 sites     Status: Abnormal   Collection Time: 10/08/23  7:43 PM   Specimen: BLOOD RIGHT FOREARM  Result Value Ref Range Status   Specimen Description BLOOD RIGHT FOREARM  Final   Special Requests   Final    BOTTLES DRAWN AEROBIC AND ANAEROBIC Blood Culture adequate volume   Culture  Setup Time   Final    GRAM POSITIVE COCCI IN CLUSTERS IN BOTH AEROBIC AND ANAEROBIC BOTTLES CRITICAL RESULT CALLED TO, READ BACK BY AND VERIFIED WITH: Carmie Chough on [id number] @0900  by SM Performed at [facility], [address], [place], [place] [address]    Culture METHICILLIN RESISTANT STAPHYLOCOCCUS AUREUS (A)  Final   Report Status 10/11/2023 FINAL  Final   Organism ID, Bacteria METHICILLIN RESISTANT STAPHYLOCOCCUS AUREUS  Final      Susceptibility   Methicillin resistant staphylococcus aureus - MIC*    CIPROFLOXACIN  <=0.5 SENSITIVE Sensitive     ERYTHROMYCIN  >=8 RESISTANT Resistant     GENTAMICIN <=0.5 SENSITIVE Sensitive     OXACILLIN >=4 RESISTANT Resistant     TETRACYCLINE >=16 RESISTANT Resistant     VANCOMYCIN  1 SENSITIVE Sensitive     TRIMETH /SULFA  <=10 SENSITIVE Sensitive     CLINDAMYCIN <=0.25 SENSITIVE  Sensitive     RIFAMPIN <=0.5 SENSITIVE Sensitive     Inducible Clindamycin NEGATIVE Sensitive     LINEZOLID  2 SENSITIVE Sensitive     * METHICILLIN RESISTANT STAPHYLOCOCCUS AUREUS  Blood Culture ID Panel (Reflexed)     Status: Abnormal   Collection Time: 10/08/23  7:43 PM  Result Value Ref Range Status   Enterococcus faecalis NOT DETECTED NOT DETECTED Final   Enterococcus Faecium NOT DETECTED NOT DETECTED Final   Listeria monocytogenes NOT DETECTED NOT DETECTED Final   Staphylococcus species DETECTED (A)  NOT DETECTED Final    Comment: CRITICAL RESULT CALLED TO, READ BACK BY AND VERIFIED WITH: Carmie Chough on [id number] @0900  by SM    Staphylococcus aureus (BCID) DETECTED (A) NOT DETECTED Final    Comment: Methicillin (oxacillin)-resistant Staphylococcus aureus (MRSA). MRSA is predictably resistant to beta-lactam antibiotics (except ceftaroline). Preferred therapy is vancomycin  unless clinically contraindicated. Patient requires contact precautions if  hospitalized. CRITICAL RESULT CALLED TO, READ BACK BY AND VERIFIED WITH: Carmie Chough on [phone] @0900  by SM    Staphylococcus epidermidis NOT DETECTED NOT DETECTED Final   Staphylococcus lugdunensis NOT DETECTED NOT DETECTED Final   Streptococcus species NOT DETECTED NOT DETECTED Final   Streptococcus agalactiae NOT DETECTED NOT DETECTED Final   Streptococcus pneumoniae NOT DETECTED NOT DETECTED Final   Streptococcus pyogenes NOT DETECTED NOT DETECTED Final   A.calcoaceticus-baumannii NOT DETECTED NOT DETECTED Final   Bacteroides fragilis NOT DETECTED NOT DETECTED Final   Enterobacterales NOT DETECTED NOT DETECTED Final   Enterobacter cloacae complex NOT DETECTED NOT DETECTED Final   Escherichia coli NOT DETECTED NOT DETECTED Final   Klebsiella aerogenes NOT DETECTED NOT DETECTED Final   Klebsiella oxytoca NOT DETECTED NOT DETECTED Final   Klebsiella pneumoniae NOT DETECTED NOT DETECTED Final   Proteus species NOT DETECTED NOT DETECTED  Final   Salmonella species NOT DETECTED NOT DETECTED Final   Serratia marcescens NOT DETECTED NOT DETECTED Final   Haemophilus influenzae NOT DETECTED NOT DETECTED Final   Neisseria meningitidis NOT DETECTED NOT DETECTED Final   Pseudomonas aeruginosa NOT DETECTED NOT DETECTED Final   Stenotrophomonas maltophilia NOT DETECTED NOT DETECTED Final   Candida albicans NOT DETECTED NOT DETECTED Final   Candida auris NOT DETECTED NOT DETECTED Final   Candida glabrata NOT DETECTED NOT DETECTED Final   Candida krusei NOT DETECTED NOT DETECTED Final   Candida parapsilosis NOT DETECTED NOT DETECTED Final   Candida tropicalis NOT DETECTED NOT DETECTED Final   Cryptococcus neoformans/gattii NOT DETECTED NOT DETECTED Final   Meth resistant mecA/C and MREJ DETECTED (A) NOT DETECTED Final    Comment: CRITICAL RESULT CALLED TO, READ BACK BY AND VERIFIED WITH: Carmie Chough on [id number] @0900  by SM Performed at [facility], [address], [place], [place] [address]   Surgical pcr screen     Status: Abnormal   Collection Time: 10/09/23  2:22 AM   Specimen: Nasal Mucosa; Nasal Swab  Result Value Ref Range Status   MRSA, PCR POSITIVE (A) NEGATIVE Final    Comment: RESULT CALLED TO, READ BACK BY AND VERIFIED WITH: P BROWN RN 10/09/2023 @ 0526 BY AB    Staphylococcus aureus POSITIVE (A) NEGATIVE Final    Comment: (NOTE) The Xpert SA Assay (FDA approved for NASAL specimens in patients 29 years of age and older), is one component of a comprehensive surveillance program. It is not intended to diagnose infection nor to guide or monitor treatment. Performed at [name], [address], [place], [place] [address]   Aerobic/Anaerobic Culture w Gram Stain (surgical/deep wound)     Status: None   Collection Time: 10/09/23  1:29 PM   Specimen: Finger, Left; Tissue  Result Value Ref Range Status   Specimen Description TISSUE  Final   Special Requests LEFT FINGER ABSCESS  Final   Gram Stain    Final    NO WBC SEEN ABUNDANT GRAM POSITIVE COCCI ABUNDANT GRAM NEGATIVE RODS    Culture   Final    RARE ESCHERICHIA COLI RARE STREPTOCOCCUS ANGINOSIS FEW BACTEROIDES  FRAGILIS BETA LACTAMASE POSITIVE Performed at [facility], [address], [place], [place] [address]    Report Status 10/13/2023 FINAL  Final   Organism ID, Bacteria ESCHERICHIA COLI  Final   Organism ID, Bacteria STREPTOCOCCUS ANGINOSIS  Final      Susceptibility   Escherichia coli - MIC*    AMPICILLIN  >=32 RESISTANT Resistant     CEFEPIME  <=0.12 SENSITIVE Sensitive     CEFTAZIDIME <=1 SENSITIVE Sensitive     CEFTRIAXONE  <=0.25 SENSITIVE Sensitive     CIPROFLOXACIN  <=0.25 SENSITIVE Sensitive     GENTAMICIN 8 INTERMEDIATE Intermediate     IMIPENEM <=0.25 SENSITIVE Sensitive     TRIMETH /SULFA  >=320 RESISTANT Resistant     AMPICILLIN /SULBACTAM >=32 RESISTANT Resistant     PIP/TAZO >=128 RESISTANT Resistant ug/mL    * RARE ESCHERICHIA COLI   Streptococcus anginosis - MIC*    PENICILLIN <=0.06 SENSITIVE Sensitive     CEFTRIAXONE  0.25 SENSITIVE Sensitive     ERYTHROMYCIN  <=0.12 SENSITIVE Sensitive     LEVOFLOXACIN  <=0.25 SENSITIVE Sensitive     VANCOMYCIN  0.5 SENSITIVE Sensitive     * RARE STREPTOCOCCUS ANGINOSIS     Marlan Silva, NP [facility]  10/13/2023  2:09 PM

## 2023-10-13 NOTE — Plan of Care (Signed)

## 2023-10-13 NOTE — Progress Notes (Signed)
  [facility] Progress Note   Subjective:   Pt seen in room, reports feeling well. He denies SOB, CP, dizziness, nausea. Was concerned about [facility] coming out and not being able to receive HD- explained that we will have a new catheter placed tomorrow.   Objective Vitals:   10/12/23 0751 10/12/23 1930 10/13/23 0419 10/13/23 0744  BP: 109/70 (!) 95/55 100/65 116/70  Pulse:  89 83 78  Resp: 10 16 16 18   Temp: 98.3 F (36.8 C) 98.5 F (36.9 C) 98.3 F (36.8 C) (!) 97.5 F (36.4 C)  TempSrc: Oral Oral    SpO2: 94% 94% 96% 98%  Weight:      Height:       Physical Exam General: Alert male in NAD Heart: RRR, no murmurs, rubs or gallops Lungs: CTA bilaterally, respirations unlabored on RA Abdomen: Soft, non-distended, +BS Extremities: B/l BKA, no edema noted Dialysis Access:  Bandage over prior [facility] site  Additional Objective Labs: Basic Metabolic Panel: Recent Labs  Lab 10/09/23 0631 10/10/23 4098 10/10/23 1218 10/11/23 0627 10/12/23 0608 10/13/23 0646  NA 134*   < > 135 132* 128* 129*  K 3.8   < > 3.7 4.4 3.9 4.5  CL 101   < > 95* 95* 94* 93*  CO2 21*   < > 21* 20* 22 17*  GLUCOSE 149*   < > 131* 200* 226* 198*  BUN 33*   < > 15 35* 58* 79*  CREATININE 7.78*   < > 3.77* 6.43* 8.00* 9.92*  CALCIUM  8.5*   < > 8.8* 9.0 9.1 9.1  PHOS 4.5  --  2.1*  --   --   --    < > = values in this interval not displayed.   Liver Function Tests: Recent Labs  Lab 10/11/23 0627 10/12/23 0608 10/13/23 0646  AST 31 33 27  ALT 20 21 18   ALKPHOS 411* 488* 432*  BILITOT 1.5* 1.4* 1.2  PROT 6.7 6.9 6.8  ALBUMIN  2.2* 2.2* 2.1*   Recent Labs  Lab 10/08/23 1634  LIPASE 32   CBC: Recent Labs  Lab 10/08/23 1634 10/09/23 0631 10/10/23 0628 10/10/23 1218 10/11/23 0627 10/12/23 0608 10/13/23 0646  WBC 12.2*   < > 13.3* 14.2* 12.6* 11.9* 13.8*  NEUTROABS 10.7*  --   --   --   --   --   --   HGB 10.6*   < > 9.2* 9.7* 10.0* 11.1* 11.1*  HCT 31.2*   < > 27.9* 28.7*  31.7* 32.1* 32.7*  MCV 97.2   < > 101.8* 99.3 103.9* 96.4 96.7  PLT 87*   < > 68* 78* 75* 83* 91*   < > = values in this interval not displayed.   Blood Culture    Component Value Date/Time   SDES TISSUE 10/09/2023 1329   SPECREQUEST LEFT FINGER ABSCESS 10/09/2023 1329   CULT  10/09/2023 1329    RARE ESCHERICHIA COLI RARE STREPTOCOCCUS ANGINOSIS FEW BACTEROIDES FRAGILIS BETA LACTAMASE POSITIVE Performed at [facility], [address], [place], [place] [address]    REPTSTATUS 10/13/2023 FINAL 10/09/2023 1329    Cardiac Enzymes: No results for input(s): "CKTOTAL", "CKMB", "CKMBINDEX", "TROPONINI" in the last 168 hours. CBG: Recent Labs  Lab 10/12/23 0541 10/12/23 1140 10/12/23 1616 10/12/23 2109 10/13/23 0630  GLUCAP 191* 214* 175* 155* 174*   Iron  Studies: No results for input(s): "IRON ", "TIBC", "TRANSFERRIN", "FERRITIN" in the last 72 hours. @lablastinr3 @ Studies/Results: IR Removal Tun Cv  Cath W/O FL Result Date: 10/12/2023 INDICATION: Patient with a history of end-stage renal disease receiving hemodialysis via a right IJ tunnel dialysis catheter placed by nephrology September 01, 2023. Patient now with bacteremia. Interventional radiology asked to remove line for a line holiday. EXAM: REMOVAL TUNNELED CENTRAL VENOUS CATHETER MEDICATIONS: 1% lidocaine  8 mL ANESTHESIA/SEDATION: None FLUOROSCOPY: None COMPLICATIONS: None immediate. PROCEDURE: Informed written consent was obtained from the patient after a thorough discussion of the procedural risks, benefits and alternatives. All questions were addressed. Maximal Sterile Barrier Technique was utilized including caps, mask, sterile gowns, sterile gloves, sterile drape, hand hygiene and skin antiseptic. A timeout was performed prior to the initiation of the procedure. The patient's right chest and catheter was prepped and draped in a normal sterile fashion. Heparin  was removed from both ports of catheter. 1% lidocaine  was used for  local anesthesia. Using gentle blunt dissection and mild manual traction the cuff of the catheter was exposed and the catheter was removed in it's entirety. Pressure was held till hemostasis was obtained. A sterile dressing was applied. The patient tolerated the procedure well with no immediate complications. IMPRESSION: Successful catheter removal as described above. Procedure performed by Jetta Morrow, NP Electronically Signed   By: Elene Griffes M.D.   On: 10/12/2023 19:05   ECHOCARDIOGRAM COMPLETE Result Date: 10/12/2023    ECHOCARDIOGRAM REPORT   Patient Name:   Alvin Daniels Date of Exam: 10/12/2023 Medical Rec #:  [id number]                        Height:       59.0 in Accession #:    [id number]                       Weight:       104.7 lb Date of Birth:  [birth year]                         BSA:          1.401 m Patient Age:    53 years                         BP:           106/68 mmHg Patient Gender: M                                HR:           76 bpm. Exam Location:  Inpatient Procedure: 2D Echo, Cardiac Doppler and Color Doppler (Both Spectral and Color            Flow Doppler were utilized during procedure). Indications:    Endocarditis  History:        Patient has prior history of Echocardiogram examinations, most                 recent 02/29/2020.  Sonographer:    Andrena Bang Referring Phys: [id number] TRUNG T VU IMPRESSIONS  1. Left ventricular ejection fraction, by estimation, is 60 to 65%. The left ventricle has normal function. The left ventricle has no regional wall motion abnormalities. Left ventricular diastolic parameters are indeterminate.  2. Right ventricular systolic function is normal. The right ventricular size is normal. Tricuspid regurgitation signal is inadequate for assessing PA pressure.  3. Catheter noted in the right  atrium with semi mobile densities attached concerning for possible vegetations.  4. The mitral valve is normal in structure. No evidence of mitral valve  regurgitation. No evidence of mitral stenosis.  5. The aortic valve is tricuspid. Aortic valve regurgitation is not visualized. No aortic stenosis is present.  6. The inferior vena cava is normal in size with greater than 50% respiratory variability, suggesting right atrial pressure of 3 mmHg. FINDINGS  Left Ventricle: Left ventricular ejection fraction, by estimation, is 60 to 65%. The left ventricle has normal function. The left ventricle has no regional wall motion abnormalities. The left ventricular internal cavity size was normal in size. There is  no left ventricular hypertrophy. Left ventricular diastolic parameters are indeterminate. Right Ventricle: The right ventricular size is normal. Right vetricular wall thickness was not well visualized. Right ventricular systolic function is normal. Tricuspid regurgitation signal is inadequate for assessing PA pressure. Left Atrium: Left atrial size was normal in size. Right Atrium: Catheter noted in the right atrium with semi mobile densities attached concerning for possible vegetations. Right atrial size was normal in size. Pericardium: Incidental finding of hepatic cyst. There is no evidence of pericardial effusion. Mitral Valve: The mitral valve is normal in structure. No evidence of mitral valve regurgitation. No evidence of mitral valve stenosis. Tricuspid Valve: The tricuspid valve is normal in structure. Tricuspid valve regurgitation is not demonstrated. No evidence of tricuspid stenosis. Aortic Valve: The aortic valve is tricuspid. Aortic valve regurgitation is not visualized. No aortic stenosis is present. Aortic valve mean gradient measures 3.0 mmHg. Aortic valve peak gradient measures 5.4 mmHg. Aortic valve area, by VTI measures 2.54 cm. Pulmonic Valve: The pulmonic valve was not well visualized. Pulmonic valve regurgitation is not visualized. No evidence of pulmonic stenosis. Aorta: The aortic root and ascending aorta are structurally normal, with no  evidence of dilitation. Venous: The inferior vena cava is normal in size with greater than 50% respiratory variability, suggesting right atrial pressure of 3 mmHg. IAS/Shunts: No atrial level shunt detected by color flow Doppler.  LEFT VENTRICLE PLAX 2D LVIDd:         3.57 cm     Diastology LVIDs:         2.45 cm     LV e' medial:    5.00 cm/s LV PW:         1.00 cm     LV E/e' medial:  15.5 LV IVS:        1.03 cm     LV e' lateral:   8.16 cm/s LVOT diam:     2.00 cm     LV E/e' lateral: 9.5 LV SV:         55 LV SV Index:   39 LVOT Area:     3.14 cm  LV Volumes (MOD) LV vol d, MOD A2C: 55.3 ml LV vol d, MOD A4C: 52.7 ml LV vol s, MOD A2C: 16.9 ml LV vol s, MOD A4C: 22.1 ml LV SV MOD A2C:     38.4 ml LV SV MOD A4C:     52.7 ml LV SV MOD BP:      34.7 ml RIGHT VENTRICLE RV S prime:     11.50 cm/s TAPSE (M-mode): 1.8 cm LEFT ATRIUM           Index LA diam:      2.45 cm 1.75 cm/m LA Vol (A2C): 25.1 ml 17.92 ml/m LA Vol (A4C): 17.2 ml 12.28 ml/m  AORTIC VALVE AV Area (Vmax):  2.64 cm AV Area (Vmean):   2.36 cm AV Area (VTI):     2.54 cm AV Vmax:           116.00 cm/s AV Vmean:          72.500 cm/s AV VTI:            0.218 m AV Peak Grad:      5.4 mmHg AV Mean Grad:      3.0 mmHg LVOT Vmax:         97.40 cm/s LVOT Vmean:        54.400 cm/s LVOT VTI:          0.176 m LVOT/AV VTI ratio: 0.81  AORTA Ao Root diam: 3.10 cm Ao Asc diam:  3.40 cm MITRAL VALVE MV Area (PHT): 2.69 cm    SHUNTS MV Decel Time: 282 msec    Systemic VTI:  0.18 m MV E velocity: 77.40 cm/s  Systemic Diam: 2.00 cm MV A velocity: 69.80 cm/s MV E/A ratio:  1.11 Armida Lander MD Electronically signed by Armida Lander MD Signature Date/Time: 10/12/2023/12:38:19 PM    Final    Medications:   bictegravir-emtricitabine -tenofovir  AF  1 tablet Oral Daily   Chlorhexidine  Gluconate Cloth  6 each Topical Q0600   Chlorhexidine  Gluconate Cloth  6 each Topical Q0600   heparin   5,000 Units Subcutaneous Q8H   insulin  aspart  0-5 Units Subcutaneous QHS    insulin  aspart  0-9 Units Subcutaneous TID WC   insulin  aspart protamine - aspart  5 Units Subcutaneous BID WC   lidocaine   1 patch Transdermal Q24H   lidocaine  (PF)  10 mL Infiltration Once   midodrine   10 mg Oral TID   mupirocin  ointment  1 Application Nasal BID   vancomycin  variable dose per unstable renal function (pharmacist dosing)   Does not apply See admin instructions    Dialysis Orders: [facility] MWF 3.5h  B400  45.5kg   TDC   Heparin  2000 Last OP HD 5/28, post wt 45.7kg Very small wt gains, always makes it to dry wt  Assessment/Plan: MRSA bacteremia.  IV Vancomycin  started. Had [facility] in place,  also w finger necrosis. ID consulted. Needs line holiday. IR removed TDC for line holiday yesterday.  Finger infection. Osteo on imaging. 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  ESRD: on HD MWF.  Last HD 5/30. HD cath removed yesterday, tentatively plan to replace tomorrow afternoon (48 hours), with HD after Chronic hypotension: BP's at OP HD as typically low in the 80's-90s or lower during the sessions.  Volume Tolerated 1.9L UF on 5/30. UF to EDW as able.  Anemia of esrd: Hgb 10-11, follow.  Secondary hyperparathyroidism: Calcium /Phos acceptable. Continue home meds.  HIV. On Bictarvy.     Ramona Burner, PA-C 10/13/2023, 10:39 AM  [facility] Pager: [phone]

## 2023-10-13 NOTE — Progress Notes (Signed)
 PROGRESS NOTE Alvin Daniels  MVH:[id number] DOB: 09-25-1970 DOA: 10/08/2023 PCP: [name], MD  Brief Narrative/Hospital Course: a 53 y.o. male with medical history significant for ESRD on HD MWF, uncontrolled type 2 diabetes, HIV, chronic hepatitis C, and s/p bilateral BKA who presented to the ED for evaluation of nausea/vomiting and cramping abdominal pain for a week feeling feverish since Saturday, having pain in his low back afebrile and lower abdomen and admitted for sepsis secondary to left fourth finger osteomyelitis. In the ED hypotensive 78/52, not hypoxic.  Labs showed hypokalemia 3.1, normal lactic acid 1.3> 0.9, leukocytosis 12 anemia 10.6 thrombocytopenia 87 MRSA PCR positive , elevated but flat troponin 53> 54 Blood culture sent,, Started on vancomycin  and ceftriaxone  Blood cx 4/4 MRSA- antibiotics changed to vanco alone. 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 6/1> echo showed vegetation on the tip of the catheter, HD catheter subsequently went by IR  Subjective: Patient seen and examined Overnight afebrile BP stable on room air Labs with mild leukocytosis, chronic thrombocytopenia metabolic acidosis and azotemia Complains of some pain on the back no new complaints  Assessment and plan:  Severe Sepsis POA 2/2 Osteomyelitis of the left fourth finger HD catheter team visitation on TTE MRSA bacteremia 4/4 likely form above vs HD line infection Chronic Hypotension Back pain/shoulder pain-Mri shoulder , L spine negative: 1 month of worsening L 4th finger-xray w/ osteomyelitis of the fourth mid and distal phalanges- Intra-op cx w/ e coli, and streps 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 BP soft chronically in 70s to 80s, lactic acid normal. MRSA sepsis/bacteremia source HD cath versus left finger.ID ortho nephrology following 6/1> echo showed vegetation on the tip of the catheter, HD catheter subsequently went by IR Planning to  have a catheter for HD 6/3 Continue contact isolation, continue vancomycin .  Chronic hypotension: Continues to have low BP with SBP in the 80-90s> Cont midodrine   TID, PTA on prn  Elevated alk phos Gallbladder sludge: ALP 261, mildly elevated AST to 62 and bilirubin of 1.4 RUQ U/S shows some gallbladder sludge but no acute cholecystitis, CBD minimally increased from 2021 No RUQ pain.  Trend labs-AST ALT remains normal improved.  Alk phos high, trend    ESRD on HD MWF Anemia of chronic disease Mild hypokalemia Metabolic bone disease: Nephrology following continue HD as #1-with line holiday, HD catheter removed 6/1.    Type 2 diabetes with hyperglycemia: Last A1c 10.0% on 07/17/2023, PTA on NovoLog  70/30 at 15 units bid- cont at lower dose 5 units BID, and  SSI ACHS Recent Labs  Lab 10/12/23 0541 10/12/23 1140 10/12/23 1616 10/12/23 2109 10/13/23 0630  GLUCAP 191* 214* 175* 155* 174*   HIV: Last CD4 count 315, viral load 27 on 04/16/23 now < 20, CD4 254 from 315. Continue Biktarvy  per ID  Thrombocytopenia, chronic: Monitor platelet counts.   Bilateral BKA: Cont b/l prosthetics   Discussed plan of care with IR nephrology and ID.  DVT prophylaxis: heparin  injection 5,000 Units Start: 10/09/23 0030 Code Status:   Code Status: Full Code Family Communication: plan of care discussed with patient at bedside. Patient status is: Remains hospitalized because of severity of illness Level of care: Telemetry Medical   Dispo: The patient is from: home            Anticipated disposition: TBD Objective: Vitals last 24 hrs: Vitals:   10/12/23 0751 10/12/23 1930 10/13/23 0419 10/13/23 0744  BP: 109/70 (!) 95/55 100/65 116/70  Pulse:  89  83 78  Resp: 10 16 16 18   Temp: 98.3 F (36.8 C) 98.5 F (36.9 C) 98.3 F (36.8 C) (!) 97.5 F (36.4 C)  TempSrc: Oral Oral    SpO2: 94% 94% 96% 98%  Weight:      Height:        Physical Examination: General exam: alert awake,  oriented HEENT:Oral mucosa moist, Ear/Nose WNL grossly Respiratory system: Bilaterally clear BS,no use of accessory muscle Cardiovascular system: S1 & S2 +, No JVD. Gastrointestinal system: Abdomen soft,NT,ND, BS+ Nervous System: Alert, awake, moving all extremities,and following commands. Extremities: b/l bka Skin: No rashes,no icterus. MSK: Normal muscle bulk,tone, power  HD catheter site with dressing in place.  Data Reviewed: I have personally reviewed following labs and imaging studies ( see epic result tab) CBC: Recent Labs  Lab 10/08/23 1634 10/09/23 0631 10/10/23 1478 10/10/23 1218 10/11/23 0627 10/12/23 0608 10/13/23 0646  WBC 12.2*   < > 13.3* 14.2* 12.6* 11.9* 13.8*  NEUTROABS 10.7*  --   --   --   --   --   --   HGB 10.6*   < > 9.2* 9.7* 10.0* 11.1* 11.1*  HCT 31.2*   < > 27.9* 28.7* 31.7* 32.1* 32.7*  MCV 97.2   < > 101.8* 99.3 103.9* 96.4 96.7  PLT 87*   < > 68* 78* 75* 83* 91*   < > = values in this interval not displayed.   CMP: Recent Labs  Lab 10/08/23 1947 10/09/23 0631 10/10/23 2956 10/10/23 1218 10/11/23 0627 10/12/23 0608 10/13/23 0646  NA  --  134* 132* 135 132* 128* 129*  K  --  3.8 4.2 3.7 4.4 3.9 4.5  CL  --  101 101 95* 95* 94* 93*  CO2  --  21* 15* 21* 20* 22 17*  GLUCOSE  --  149* 231* 131* 200* 226* 198*  BUN  --  33* 50* 15 35* 58* 79*  CREATININE  --  7.78* 9.92* 3.77* 6.43* 8.00* 9.92*  CALCIUM   --  8.5* 8.5* 8.8* 9.0 9.1 9.1  MG 2.1  --   --   --   --   --   --   PHOS  --  4.5  --  2.1*  --   --   --    GFR: Estimated Creatinine Clearance: 5.9 mL/min (A) (by C-G formula based on SCr of 9.92 mg/dL (H)). Recent Labs  Lab 10/08/23 1634 10/09/23 0631 10/10/23 2130 10/10/23 1218 10/11/23 0627 10/12/23 0608 10/13/23 0646  AST 52*  --  31  --  31 33 27  ALT 34  --  22  --  20 21 18   ALKPHOS 261*  --  315*  --  411* 488* 432*  BILITOT 1.4*  --  1.3*  --  1.5* 1.4* 1.2  PROT 7.9  --  6.7  --  6.7 6.9 6.8  ALBUMIN  2.7*   < >  2.3* 2.6* 2.2* 2.2* 2.1*   < > = values in this interval not displayed.    Recent Labs  Lab 10/08/23 1634  LIPASE 32   No results for input(s): "AMMONIA" in the last 168 hours. Coagulation Profile:  Recent Labs  Lab 10/08/23 1947  INR 1.2   Unresulted Labs (From admission, onward)     Start     Ordered   10/14/23 0500  Vancomycin , random  Tomorrow morning,   R       Question:  Specimen collection method  Answer:  Lab=Lab collect   10/13/23 0828   10/10/23 0500  Comprehensive metabolic panel with GFR  Daily,   R      10/09/23 0833   10/10/23 0500  CBC  Daily,   R      10/09/23 4098   10/08/23 1634  Urinalysis, Routine w reflex microscopic -Urine, Clean Catch  (ED Abdominal Pain)  Once,   URGENT       Question:  Specimen Source  Answer:  Urine, Clean Catch   10/08/23 1633   Signed and Held  Renal function panel  Once,   R       Question:  Specimen collection method  Answer:  Lab=Lab collect   Signed and Held   Signed and Held  CBC  Once,   R       Question:  Specimen collection method  Answer:  Lab=Lab collect   Signed and Held           Antimicrobials/Microbiology: Anti-infectives (From admission, onward)    Start     Dose/Rate Route Frequency Ordered Stop   10/12/23 0758  vancomycin  variable dose per unstable renal function (pharmacist dosing)         Does not apply See admin instructions 10/12/23 0758     10/10/23 1200  vancomycin  (VANCOCIN ) 500 mg in sodium chloride  0.9 % 100 mL IVPB  Status:  Discontinued        500 mg 110 mL/hr over 60 Minutes Intravenous Every M-W-F (Hemodialysis) 10/09/23 0207 10/12/23 0758   10/09/23 1000  bictegravir-emtricitabine -tenofovir  AF (BIKTARVY ) 50-200-25 MG per tablet 1 tablet        1 tablet Oral Daily 10/09/23 0028     10/09/23 1000  cefTRIAXone  (ROCEPHIN ) 2 g in sodium chloride  0.9 % 100 mL IVPB  Status:  Discontinued        2 g 200 mL/hr over 30 Minutes Intravenous Every 24 hours 10/09/23 0156 10/10/23 1557   10/08/23 2000   vancomycin  (VANCOCIN ) IVPB 1000 mg/200 mL premix        1,000 mg 200 mL/hr over 60 Minutes Intravenous  Once 10/08/23 1958 10/08/23 2314   10/08/23 2000  piperacillin -tazobactam (ZOSYN ) IVPB 2.25 g        2.25 g 100 mL/hr over 30 Minutes Intravenous Once 10/08/23 1958 10/09/23 0851         Component Value Date/Time   SDES TISSUE 10/09/2023 1329   SPECREQUEST LEFT FINGER ABSCESS 10/09/2023 1329   CULT  10/09/2023 1329    RARE ESCHERICHIA COLI RARE STREPTOCOCCUS ANGINOSIS FEW BACTEROIDES FRAGILIS BETA LACTAMASE POSITIVE Performed at [facility], [address] [place], [place] [address]    REPTSTATUS 10/13/2023 FINAL 10/09/2023 1329    Procedures: Procedure(s) (LRB): AMPUTATION, FINGER (Left) Medications reviewed:  Scheduled Meds:  bictegravir-emtricitabine -tenofovir  AF  1 tablet Oral Daily   Chlorhexidine  Gluconate Cloth  6 each Topical Q0600   Chlorhexidine  Gluconate Cloth  6 each Topical Q0600   Chlorhexidine  Gluconate Cloth  6 each Topical Q0600   heparin   5,000 Units Subcutaneous Q8H   insulin  aspart  0-5 Units Subcutaneous QHS   insulin  aspart  0-9 Units Subcutaneous TID WC   insulin  aspart protamine - aspart  5 Units Subcutaneous BID WC   lidocaine   1 patch Transdermal Q24H   lidocaine  (PF)  10 mL Infiltration Once   midodrine   10 mg Oral TID   mupirocin  ointment  1 Application Nasal BID   vancomycin  variable dose per unstable renal function (pharmacist dosing)  Does not apply See admin instructions   Continuous Infusions:    Lesa Rape, MD [facility] 10/13/2023, 11:22 AM

## 2023-10-13 NOTE — Progress Notes (Signed)
 Transition of Care [facility]) - Inpatient Brief Assessment   Patient Details  Name: Alvin Daniels MRN: [id number] Date of Birth: 03-30-1971  [facility] CM/SW Contact:    Jannine Meo, RN Phone Number: 10/13/2023, 1:47 PM   Clinical Narrative:  Patient from home with c/o abdominal pain with n/v. Patient had dialysis catheter removed on 6/1 due to infection. Patient to have new dialysis access placed on 6/2. Dialysis days are MWF.  Transition of Care Asessment: Insurance and Status: (P) Selfpay (No insurance listed) Patient has primary care physician: (P) Yes Home environment has been reviewed: (P) Home Prior level of function:: (P) Independent Prior/Current Home Services: (P) No current home services (Used [facility] in 2020 under charity) Social Drivers of Health Review: (P) SDOH reviewed no interventions necessary Readmission risk has been reviewed: (P) Yes Transition of care needs: (P) no transition of care needs at this time

## 2023-10-13 NOTE — Progress Notes (Signed)
   Translator ID [id number]  [facility] has been requested to perform a transesophageal echocardiogram on Alvin Daniels for evaluation after being found with MRSA bacteremia.     The patient does NOT have any absolute or relative contraindications to a Transesophageal Echocardiogram (TEE).  The patient has: No other conditions that may impact this procedure.    After careful review of history and examination, the risks and benefits of transesophageal echocardiogram have been explained including risks of esophageal damage, perforation (1:10,000 risk), bleeding, pharyngeal hematoma as well as other potential complications associated with conscious sedation including aspiration, arrhythmia, respiratory failure and death. Alternatives to treatment were discussed, questions were answered. Patient is willing to proceed.   Coby Darting, PA-C  10/13/2023 3:12 PM

## 2023-10-13 NOTE — Progress Notes (Signed)
 Patient resting comfortably.  Pain in the hand is well controlled.   Left Hand: Amputation site through the ring finger proximal phalanx is clean, dry, and well approximated.  There is minimal swelling.  There is no erythema.  There is no drainage.  The surgical site is non tender to palpation.  He has full and painless ROM of the wrist and remaining fingers.  All fingers are warm and well perfused w/ BCR.  53 yo M POD 4 s/p right ring finger amputation through PIP joint/proximal phalangeal head for abscess with associated osteomyelitis involving P2 and P3.  - The amputation site looks great; no concern for persistent/residual infection - Intraoperative cultures growing E.coli and S.anginosis; abx choice per ID recommendations - Dressing changed today.  Can begin washing hand with warm, soapy water starting on Thursday - No plans for additional surgery  Marilyn Shropshire, M.D. [facility]

## 2023-10-14 ENCOUNTER — Other Ambulatory Visit (HOSPITAL_COMMUNITY): Payer: Self-pay

## 2023-10-14 ENCOUNTER — Inpatient Hospital Stay (HOSPITAL_COMMUNITY): Payer: Self-pay

## 2023-10-14 HISTORY — PX: IR FLUORO GUIDE CV LINE RIGHT: IMG2283

## 2023-10-14 HISTORY — PX: IR US GUIDE VASC ACCESS RIGHT: IMG2390

## 2023-10-14 LAB — VANCOMYCIN, RANDOM: Vancomycin Rm: 17 ug/mL

## 2023-10-14 LAB — COMPREHENSIVE METABOLIC PANEL WITH GFR
ALT: 16 U/L (ref 0–44)
AST: 23 U/L (ref 15–41)
Albumin: 2.2 g/dL — ABNORMAL LOW (ref 3.5–5.0)
Alkaline Phosphatase: 409 U/L — ABNORMAL HIGH (ref 38–126)
Anion gap: 22 — ABNORMAL HIGH (ref 5–15)
BUN: 98 mg/dL — ABNORMAL HIGH (ref 6–20)
CO2: 15 mmol/L — ABNORMAL LOW (ref 22–32)
Calcium: 9.2 mg/dL (ref 8.9–10.3)
Chloride: 92 mmol/L — ABNORMAL LOW (ref 98–111)
Creatinine, Ser: 11.91 mg/dL — ABNORMAL HIGH (ref 0.61–1.24)
GFR, Estimated: 5 mL/min — ABNORMAL LOW (ref >60)
Glucose, Bld: 202 mg/dL — ABNORMAL HIGH (ref 70–99)
Potassium: 5.1 mmol/L (ref 3.5–5.1)
Sodium: 129 mmol/L — ABNORMAL LOW (ref 135–145)
Total Bilirubin: 1.4 mg/dL — ABNORMAL HIGH (ref 0.0–1.2)
Total Protein: 7.3 g/dL (ref 6.5–8.1)

## 2023-10-14 LAB — GLUCOSE, CAPILLARY
Glucose-Capillary: 198 mg/dL — ABNORMAL HIGH (ref 70–99)
Glucose-Capillary: 188 mg/dL — ABNORMAL HIGH (ref 70–99)
Glucose-Capillary: 95 mg/dL (ref 70–99)
Glucose-Capillary: 228 mg/dL — ABNORMAL HIGH (ref 70–99)

## 2023-10-14 LAB — CBC
HCT: 32.9 % — ABNORMAL LOW (ref 39.0–52.0)
Hemoglobin: 11 g/dL — ABNORMAL LOW (ref 13.0–17.0)
MCH: 32 pg (ref 26.0–34.0)
MCHC: 33.4 g/dL (ref 30.0–36.0)
MCV: 95.6 fL (ref 80.0–100.0)
Platelets: 110 10*3/uL — ABNORMAL LOW (ref 150–400)
RBC: 3.44 MIL/uL — ABNORMAL LOW (ref 4.22–5.81)
RDW: 14.8 % (ref 11.5–15.5)
WBC: 14.3 10*3/uL — ABNORMAL HIGH (ref 4.0–10.5)
nRBC: 0 % (ref 0.0–0.2)

## 2023-10-14 MED ORDER — VANCOMYCIN HCL 500 MG/100ML IV SOLN
INTRAVENOUS | Status: AC
Start: 2023-10-14 — End: ?
  Filled 2023-10-14: qty 100

## 2023-10-14 MED ORDER — FENTANYL CITRATE (PF) 100 MCG/2ML IJ SOLN
INTRAMUSCULAR | Status: AC | PRN
  Administered 2023-10-14 (×2): 25 ug via INTRAVENOUS

## 2023-10-14 MED ORDER — CHLORHEXIDINE GLUCONATE 4 % EX SOLN
CUTANEOUS | Status: AC
Start: 2023-10-14 — End: ?
  Filled 2023-10-14: qty 15

## 2023-10-14 MED ORDER — HEPARIN SODIUM (PORCINE) 1000 UNIT/ML IJ SOLN
INTRAMUSCULAR | Status: AC
  Filled 2023-10-14: qty 10

## 2023-10-14 MED ORDER — VANCOMYCIN HCL 500 MG/100ML IV SOLN
500.0000 mg | INTRAVENOUS | Status: AC
  Administered 2023-10-14: 500 mg via INTRAVENOUS

## 2023-10-14 MED ORDER — LIDOCAINE-EPINEPHRINE 1 %-1:100000 IJ SOLN
INTRAMUSCULAR | Status: AC
  Filled 2023-10-14: qty 1

## 2023-10-14 MED ORDER — CEFAZOLIN SODIUM-DEXTROSE 2-4 GM/100ML-% IV SOLN
INTRAVENOUS | Status: AC | PRN
  Administered 2023-10-14: 2 g via INTRAVENOUS

## 2023-10-14 MED ORDER — FENTANYL CITRATE (PF) 100 MCG/2ML IJ SOLN
INTRAMUSCULAR | Status: AC
Start: 2023-10-14 — End: ?
  Filled 2023-10-14: qty 2

## 2023-10-14 MED ORDER — CEFAZOLIN SODIUM-DEXTROSE 2-4 GM/100ML-% IV SOLN
INTRAVENOUS | Status: AC
  Filled 2023-10-14: qty 100

## 2023-10-14 MED ORDER — MIDAZOLAM HCL 2 MG/2ML IJ SOLN
INTRAMUSCULAR | Status: AC | PRN
  Administered 2023-10-14 (×2): .5 mg via INTRAVENOUS

## 2023-10-14 MED ORDER — MIDAZOLAM HCL 2 MG/2ML IJ SOLN
INTRAMUSCULAR | Status: AC
Start: 2023-10-14 — End: ?
  Filled 2023-10-14: qty 2

## 2023-10-14 NOTE — Plan of Care (Signed)
  Problem: Education: Goal: Knowledge of General Education information will improve Description: Including pain rating scale, medication(s)/side effects and non-pharmacologic comfort measures Outcome: Progressing   Problem: Health Behavior/Discharge Planning: Goal: Ability to manage health-related needs will improve Outcome: Progressing   Problem: Clinical Measurements: Goal: Ability to maintain clinical measurements within normal limits will improve Outcome: Progressing Goal: Will remain free from infection Outcome: Progressing Goal: Diagnostic test results will improve Outcome: Progressing Goal: Respiratory complications will improve Outcome: Progressing Goal: Cardiovascular complication will be avoided Outcome: Progressing   Problem: Activity: Goal: Risk for activity intolerance will decrease Outcome: Progressing   Problem: Nutrition: Goal: Adequate nutrition will be maintained Outcome: Progressing   Problem: Coping: Goal: Level of anxiety will decrease Outcome: Progressing   Problem: Elimination: Goal: Will not experience complications related to bowel motility Outcome: Progressing Goal: Will not experience complications related to urinary retention Outcome: Progressing   Problem: Pain Managment: Goal: General experience of comfort will improve and/or be controlled Outcome: Progressing   Problem: Skin Integrity: Goal: Risk for impaired skin integrity will decrease Outcome: Progressing   Problem: Education: Goal: Ability to describe self-care measures that may prevent or decrease complications (Diabetes Survival Skills Education) will improve Outcome: Progressing Goal: Individualized Educational Video(s) Outcome: Progressing   Problem: Coping: Goal: Ability to adjust to condition or change in health will improve Outcome: Progressing   Problem: Fluid Volume: Goal: Ability to maintain a balanced intake and output will improve Outcome: Progressing    Problem: Health Behavior/Discharge Planning: Goal: Ability to identify and utilize available resources and services will improve Outcome: Progressing Goal: Ability to manage health-related needs will improve Outcome: Progressing

## 2023-10-14 NOTE — Consult Note (Signed)
 Chief Complaint: ESRD on dialysis - IR consulted for tunneled hemodialysis catheter  Referring Provider(s): Chucky Craver, MD  Supervising Physician: Creasie Doctor  Patient Status: Al[facility] - In-pt  History of Present Illness: Alvin Daniels is a 53 y.o. male with pmhx ESRD on HD MWF, uncontrolled type 2 diabetes, HIV, chronic hepatitis C, and s/p bilateral BKA. Initially presented to ED 10/08/23 for abdominal pain and nausea/vomiting along with fevers/chills. Upon evaluation, found to have swelling pain and drainage of left 4th finger requiring amputation with orthopedics on 5/29.  Pt has had prior right sided tunneled HD cath and reports compliance with dialysis prior. Had tunneled line pulled 10/12/23 secondary to MRSA bacteremia for line holiday. New blood cultures drawn 10/13/23 with no growth so far. Nephrology and ID cleared patient for re-placement of tunneled dialysis catheter for ongoing hemodialysis need.   Evaluation today performed with spanish interpreter on iPad at bedside.   Patient is Full Code  Past Medical History:  Diagnosis Date   Acute bilateral low back pain 01/03/2021   AIDS ([name]) 11/22/2014   Back pain 06/09/2019   BKA stump complication ([name]) 06/09/2019   Chronic diarrhea    Chronic hepatitis C without hepatic coma ([name]) 11/22/2014   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[name])    Right   Corneal ulcer 12/18/2022   Descemetocele of left eye 12/18/2022   Diabetic neuropathy ([name])    ESRD (end stage renal disease) on dialysis ([name])    "TTS; don't remember street name" (05/03/2014)   Hepatitis C    HIV INFECTION 06/27/2010   Qualifier: Diagnosis of  By: Oretha Birch CMA ( AAMA), Jacqueline     Hypotension 06/02/2012   Metabolic bone disease 06/18/2012   MRSA infection    Normocytic anemia 06/17/2012   Pancreatitis    Pressure ulcer of BKA stump, stage 2 ([name]) 11/22/2014   Severe protein-calorie malnutrition ([name]) 06/17/2012   Uncontrolled diabetes  mellitus with complications 06/27/2010   Annotation: uncontrolled Qualifier: Diagnosis of  By: Oretha Birch CMA Nonie Beady), Jacqueline     Vaccine counseling 05/29/2022    Past Surgical History:  Procedure Laterality Date   AMPUTATION Left 04/20/2014   Procedure: 3rd toe amputation, 4th Toe Amputation,  5th Toe Amputation;  Surgeon: [name], MD;  Location: [facility] OR;  Service: Orthopedics;  Laterality: Left;   AMPUTATION Left 05/02/2014   Procedure: Midfoot Amputation;  Surgeon: [name], MD;  Location: [facility] OR;  Service: Orthopedics;  Laterality: Left;   AMPUTATION Left 06/17/2014   Procedure: AMPUTATION BELOW KNEE;  Surgeon: [name], MD;  Location: [facility] OR;  Service: Orthopedics;  Laterality: Left;   AMPUTATION Right 09/04/2018   Procedure: RIGHT BELOW KNEE AMPUTATION;  Surgeon: [name], MD;  Location: [facility] OR;  Service: Orthopedics;  Laterality: Right;  RIGHT BELOW KNEE AMPUTATION   AMPUTATION FINGER Left 10/09/2023   Procedure: AMPUTATION, FINGER;  Surgeon: Marilyn Shropshire, MD;  Location: [facility] OR;  Service: Orthopedics;  Laterality: Left;  Left Ring Finger amputation   AV FISTULA PLACEMENT Left    AV FISTULA PLACEMENT Left 05/10/2016   Procedure: Creation Left Arm Brachiocephalic Arteriovenous Fistula and Ligation of Radiocephalic Fistula;  Surgeon: [name], MD;  Location: [facility] OR;  Service: Vascular;  Laterality: Left;   DIALYSIS/PERMA CATHETER INSERTION N/A 09/01/2023   Procedure: DIALYSIS/PERMA CATHETER INSERTION;  Surgeon: Patrick Boor, MD;  Location: [facility] INVASIVE CV LAB;  Service: Cardiovascular;  Laterality: N/A;   FEMUR IM NAIL Left 08/17/2016  Procedure: INTRAMEDULLARY (IM) RETROGRADE FEMORAL NAILING;  Surgeon: Adah Acron, MD;  Location: [facility] OR;  Service: Orthopedics;  Laterality: Left;   FOOT AMPUTATION THROUGH ANKLE Left 12/'21/2015   midfoot   IR AV DIALY SHUNT INTRO NEEDLE/INTRACATH INITIAL W/PTA/IMG LEFT  04/17/2023   IR GENERIC HISTORICAL Left 05/01/2016   IR  THROMBECTOMY AV FISTULA W/THROMBOLYSIS/PTA INC/SHUNT/IMG LEFT 05/01/2016 [name], MD [facility]-INTER[facility] RAD   IR GENERIC HISTORICAL  05/01/2016   IR US  GUIDE VASC ACCESS LEFT 05/01/2016 [name], MD [facility]   IR GENERIC HISTORICAL  05/07/2016   IR FLUORO GUIDE CV LINE RIGHT 05/07/2016 [name], DO [facility]   IR GENERIC HISTORICAL  05/07/2016   IR US  GUIDE VASC ACCESS RIGHT 05/07/2016 [name], DO [facility]   IR GENERIC HISTORICAL  05/22/2016   IR US  GUIDE VASC ACCESS RIGHT 05/22/2016 [name], MD [facility]   IR GENERIC HISTORICAL  05/22/2016   IR FLUORO GUIDE CV LINE RIGHT 05/22/2016 [name], MD [facility]   IR REMOVAL TUN CV CATH W/O FL  08/21/2016   IR REMOVAL TUN CV CATH W/O FL  10/12/2023   IR US  GUIDE VASC ACCESS LEFT  04/17/2023   PERIPHERAL VASCULAR CATHETERIZATION Left 05/09/2016   Procedure: A/V Fistulagram;  Surgeon: [name], MD;  Location: [facility] INVASIVE CV LAB;  Service: Cardiovascular;  Laterality: Left;  arm   TEE WITHOUT CARDIOVERSION N/A 06/22/2018   Procedure: TRANSESOPHAGEAL ECHOCARDIOGRAM (TEE);  Surgeon: [name], MD;  Location: [facility] ENDOSCOPY;  Service: Cardiovascular;  Laterality: N/A;    Allergies: Patient has no known allergies.  Medications: Prior to Admission medications   Medication Sig Start Date End Date Taking? Authorizing Provider  bictegravir-emtricitabine -tenofovir  AF (BIKTARVY ) 50-200-25 MG TABS tablet Take 1 tablet by mouth daily. 04/16/23  Yes Ernie Heal, Jerelyn Money, MD  insulin  aspart protamine  - aspart (NOVOLOG  70/30 FLEXPEN) (70-30) 100 UNIT/ML FlexPen Inject 20 Units into the skin 2 (two) times daily. Patient taking differently: Inject 15 Units into the skin 2 (two) times daily. 07/17/23  Yes [name], MD  midodrine  (PROAMATINE ) 10 MG tablet Take 1 tablet (10 mg total) by mouth 3 (three) times daily as needed for low blood pressure, SBP</=100. may take 30 min prior to dialysis and mid treatment as  needed. 09/05/23  Yes   bictegravir-emtricitabine -tenofovir  AF (BIKTARVY ) 50-200-25 MG TABS tablet Take 1 tablet by mouth daily. 01/02/22   [name], MD     Family History  Problem Relation Age of Onset   Diabetes Mother    Diabetes Father     Social History   Socioeconomic History   Marital status: Single    Spouse name: Not on file   Number of children: Not on file   Years of education: Not on file   Highest education level: Not on file  Occupational History   Not on file  Tobacco Use   Smoking status: Never   Smokeless tobacco: Never  Vaping Use   Vaping status: Never Used  Substance and Sexual Activity   Alcohol  use: No   Drug use: No   Sexual activity: Not Currently    Comment: declined condoms  Other Topics Concern   Not on file  Social History Narrative   ** Merged History Encounter **       Social Drivers of Health   Financial Resource Strain: Low Risk  (01/29/2023)   Overall Financial Resource Strain (CARDIA)    Difficulty of Paying Living Expenses:  Not very hard  Food Insecurity: No Food Insecurity (10/08/2023)   Hunger Vital Sign    Worried About Running Out of Food in the Last Year: Never true    Ran Out of Food in the Last Year: Never true  Transportation Needs: No Transportation Needs (10/08/2023)   PRAPARE - Administrator, Civil Service (Medical): No    Lack of Transportation (Non-Medical): No  Physical Activity: Inactive (01/29/2023)   Exercise Vital Sign    Days of Exercise per Week: 0 days    Minutes of Exercise per Session: 0 min  Stress: No Stress Concern Present (01/29/2023)   [employer] of Occupational Health - Occupational Stress Questionnaire    Feeling of Stress : Not at all  Social Connections: Moderately Isolated (10/08/2023)   Social Connection and Isolation Panel [NHANES]    Frequency of Communication with Friends and Family: More than three times a week    Frequency of Social Gatherings with Friends and  Family: More than three times a week    Attends Religious Services: More than 4 times per year    Active Member of [employer] or Organizations: No    Attends Banker Meetings: Never    Marital Status: Never married     Review of Systems: A 12 point ROS discussed and pertinent positives are indicated in the HPI above.  All other systems are negative.   Vital Signs: BP (!) 96/40 (BP Location: Right Arm)   Pulse 79   Temp 97.6 F (36.4 C) (Oral)   Resp 18   Ht 4\' 11"  (1.499 m)   Wt 104 lb 11.5 oz (47.5 kg)   SpO2 98%   BMI 21.15 kg/m   Advance Care Plan: The advanced care place/surrogate decision maker was discussed at the time of visit and the patient did not wish to discuss or was not able to name a surrogate decision maker or provide an advance care plan.  Physical Exam Vitals and nursing note reviewed.  Constitutional:      General: He is not in acute distress. HENT:     Mouth/Throat:     Mouth: Mucous membranes are moist.     Pharynx: Oropharynx is clear.  Cardiovascular:     Rate and Rhythm: Normal rate and regular rhythm.  Pulmonary:     Effort: Pulmonary effort is normal.     Breath sounds: Normal breath sounds.  Abdominal:     Palpations: Abdomen is soft.     Tenderness: There is no abdominal tenderness.  Musculoskeletal:     Comments: Bil BKA noted  Skin:    General: Skin is warm and dry.  Neurological:     Mental Status: He is alert and oriented to person, place, and time. Mental status is at baseline.      Imaging: MR HIP RIGHT WO CONTRAST Result Date: 10/14/2023 CLINICAL DATA:  Soft tissue infection suspected, right hip. MRSA bacteremia. EXAM: MR OF THE RIGHT HIP WITHOUT CONTRAST TECHNIQUE: Multiplanar, multisequence MR imaging was performed. No intravenous contrast was administered. COMPARISON:  Lumbar MRI 10/10/2023, abdominopelvic CT 10/08/2023 and radiographs of the right hip 12/01/2019. FINDINGS: Technical note: Despite efforts by the  technologist and patient, mild motion artifact is present on today's exam and could not be eliminated. This reduces exam sensitivity and specificity. Bones: There is no evidence of acute fracture, dislocation or femoral head osteonecrosis. No evidence of osteomyelitis. There is an intramedullary nail within the left femoral diaphysis. The visualized sacroiliac joints  and symphysis pubis appear normal. Articular cartilage and labrum Articular cartilage: No focal chondral defect or subchondral signal abnormality identified. Labrum: There is no gross labral tear or paralabral abnormality. Joint or bursal effusion Joint effusion: No significant hip joint effusion. Bursae: No focal periarticular fluid collection. Muscles and tendons Muscles and tendons: Generalized soft tissue edema throughout the pelvis with mild involvement of the gluteus musculature bilaterally. No focal fluid collections are identified. There is chronic atrophy of the right piriformis muscle. The gluteus, common hamstring and iliopsoas tendons are intact. Other findings Miscellaneous: Generalized subcutaneous edema throughout the pelvis without focal fluid collection. There is also generalized soft tissue edema throughout the pelvis with a small amount of pelvic ascites. The distal colon is fluid-filled with mild wall thickening. IMPRESSION: 1. Nonspecific generalized soft tissue edema throughout the pelvis with mild involvement of the gluteus musculature bilaterally. No focal fluid collections are identified. 2. No evidence of osteomyelitis or septic arthritis. 3. No acute osseous findings. 4. Fluid-filled distal colon with mild wall thickening, possibly reflecting colitis. Electronically Signed   By: Elmon Hagedorn M.D.   On: 10/14/2023 10:21   IR Removal Tun Cv Cath W/O FL Result Date: 10/12/2023 INDICATION: Patient with a history of end-stage renal disease receiving hemodialysis via a right IJ tunnel dialysis catheter placed by nephrology  September 01, 2023. Patient now with bacteremia. Interventional radiology asked to remove line for a line holiday. EXAM: REMOVAL TUNNELED CENTRAL VENOUS CATHETER MEDICATIONS: 1% lidocaine  8 mL ANESTHESIA/SEDATION: None FLUOROSCOPY: None COMPLICATIONS: None immediate. PROCEDURE: Informed written consent was obtained from the patient after a thorough discussion of the procedural risks, benefits and alternatives. All questions were addressed. Maximal Sterile Barrier Technique was utilized including caps, mask, sterile gowns, sterile gloves, sterile drape, hand hygiene and skin antiseptic. A timeout was performed prior to the initiation of the procedure. The patient's right chest and catheter was prepped and draped in a normal sterile fashion. Heparin  was removed from both ports of catheter. 1% lidocaine  was used for local anesthesia. Using gentle blunt dissection and mild manual traction the cuff of the catheter was exposed and the catheter was removed in it's entirety. Pressure was held till hemostasis was obtained. A sterile dressing was applied. The patient tolerated the procedure well with no immediate complications. IMPRESSION: Successful catheter removal as described above. Procedure performed by Jetta Morrow, NP Electronically Signed   By: Elene Griffes M.D.   On: 10/12/2023 19:05   ECHOCARDIOGRAM COMPLETE Result Date: 10/12/2023    ECHOCARDIOGRAM REPORT   Patient Name:   Alvin CHARTERS Date of Exam: 10/12/2023 Medical Rec #:  [id number]                        Height:       59.0 in Accession #:    [id number]                       Weight:       104.7 lb Date of Birth:  Jul 02, 1970                         BSA:          1.401 m Patient Age:    52 years                         BP:  106/68 mmHg Patient Gender: M                                HR:           76 bpm. Exam Location:  Inpatient Procedure: 2D Echo, Cardiac Doppler and Color Doppler (Both Spectral and Color            Flow Doppler were  utilized during procedure). Indications:    Endocarditis  History:        Patient has prior history of Echocardiogram examinations, most                 recent 02/29/2020.  Sonographer:    Andrena Bang Referring Phys: [id number] TRUNG T VU IMPRESSIONS  1. Left ventricular ejection fraction, by estimation, is 60 to 65%. The left ventricle has normal function. The left ventricle has no regional wall motion abnormalities. Left ventricular diastolic parameters are indeterminate.  2. Right ventricular systolic function is normal. The right ventricular size is normal. Tricuspid regurgitation signal is inadequate for assessing PA pressure.  3. Catheter noted in the right atrium with semi mobile densities attached concerning for possible vegetations.  4. The mitral valve is normal in structure. No evidence of mitral valve regurgitation. No evidence of mitral stenosis.  5. The aortic valve is tricuspid. Aortic valve regurgitation is not visualized. No aortic stenosis is present.  6. The inferior vena cava is normal in size with greater than 50% respiratory variability, suggesting right atrial pressure of 3 mmHg. FINDINGS  Left Ventricle: Left ventricular ejection fraction, by estimation, is 60 to 65%. The left ventricle has normal function. The left ventricle has no regional wall motion abnormalities. The left ventricular internal cavity size was normal in size. There is  no left ventricular hypertrophy. Left ventricular diastolic parameters are indeterminate. Right Ventricle: The right ventricular size is normal. Right vetricular wall thickness was not well visualized. Right ventricular systolic function is normal. Tricuspid regurgitation signal is inadequate for assessing PA pressure. Left Atrium: Left atrial size was normal in size. Right Atrium: Catheter noted in the right atrium with semi mobile densities attached concerning for possible vegetations. Right atrial size was normal in size. Pericardium: Incidental finding of  hepatic cyst. There is no evidence of pericardial effusion. Mitral Valve: The mitral valve is normal in structure. No evidence of mitral valve regurgitation. No evidence of mitral valve stenosis. Tricuspid Valve: The tricuspid valve is normal in structure. Tricuspid valve regurgitation is not demonstrated. No evidence of tricuspid stenosis. Aortic Valve: The aortic valve is tricuspid. Aortic valve regurgitation is not visualized. No aortic stenosis is present. Aortic valve mean gradient measures 3.0 mmHg. Aortic valve peak gradient measures 5.4 mmHg. Aortic valve area, by VTI measures 2.54 cm. Pulmonic Valve: The pulmonic valve was not well visualized. Pulmonic valve regurgitation is not visualized. No evidence of pulmonic stenosis. Aorta: The aortic root and ascending aorta are structurally normal, with no evidence of dilitation. Venous: The inferior vena cava is normal in size with greater than 50% respiratory variability, suggesting right atrial pressure of 3 mmHg. IAS/Shunts: No atrial level shunt detected by color flow Doppler.  LEFT VENTRICLE PLAX 2D LVIDd:         3.57 cm     Diastology LVIDs:         2.45 cm     LV e' medial:    5.00 cm/s LV PW:  1.00 cm     LV E/e' medial:  15.5 LV IVS:        1.03 cm     LV e' lateral:   8.16 cm/s LVOT diam:     2.00 cm     LV E/e' lateral: 9.5 LV SV:         55 LV SV Index:   39 LVOT Area:     3.14 cm  LV Volumes (MOD) LV vol d, MOD A2C: 55.3 ml LV vol d, MOD A4C: 52.7 ml LV vol s, MOD A2C: 16.9 ml LV vol s, MOD A4C: 22.1 ml LV SV MOD A2C:     38.4 ml LV SV MOD A4C:     52.7 ml LV SV MOD BP:      34.7 ml RIGHT VENTRICLE RV S prime:     11.50 cm/s TAPSE (M-mode): 1.8 cm LEFT ATRIUM           Index LA diam:      2.45 cm 1.75 cm/m LA Vol (A2C): 25.1 ml 17.92 ml/m LA Vol (A4C): 17.2 ml 12.28 ml/m  AORTIC VALVE AV Area (Vmax):    2.64 cm AV Area (Vmean):   2.36 cm AV Area (VTI):     2.54 cm AV Vmax:           116.00 cm/s AV Vmean:          72.500 cm/s AV VTI:             0.218 m AV Peak Grad:      5.4 mmHg AV Mean Grad:      3.0 mmHg LVOT Vmax:         97.40 cm/s LVOT Vmean:        54.400 cm/s LVOT VTI:          0.176 m LVOT/AV VTI ratio: 0.81  AORTA Ao Root diam: 3.10 cm Ao Asc diam:  3.40 cm MITRAL VALVE MV Area (PHT): 2.69 cm    SHUNTS MV Decel Time: 282 msec    Systemic VTI:  0.18 m MV E velocity: 77.40 cm/s  Systemic Diam: 2.00 cm MV A velocity: 69.80 cm/s MV E/A ratio:  1.11 Armida Lander MD Electronically signed by Armida Lander MD Signature Date/Time: 10/12/2023/12:38:19 PM    Final    MR Shoulder Left W Wo Contrast Result Date: 10/10/2023 CLINICAL DATA:  MRSA bacteremia left shoulder tenderness EXAM: MRI OF THE LEFT SHOULDER WITHOUT AND WITH CONTRAST TECHNIQUE: Multiplanar, multisequence MR imaging of the left shoulder was performed before and after the administration of intravenous contrast. CONTRAST:  5mL GADAVIST  GADOBUTROL  1 MMOL/ML IV SOLN COMPARISON:  01/23/2017 radiographs FINDINGS: Rotator cuff:  A mild supraspinatus tendinopathy. Muscles:  Unremarkable Biceps long head:  Unremarkable Acromioclavicular Joint: No significant arthropathy. Type II acromion. Trace subacromial subdeltoid bursitis. Glenohumeral Joint: Mild degenerative chondral thinning. No joint effusion or substantial synovitis. Labrum:  Grossly unremarkable Bones: No significant extra-articular osseous abnormalities identified. Other: No supplemental non-categorized findings. IMPRESSION: 1. No specific findings of septic arthritis or osteomyelitis. 2. Mild supraspinatus tendinopathy. 3. Trace subacromial subdeltoid bursitis. 4. Mild degenerative chondral thinning in the glenohumeral joint. Electronically Signed   By: Freida Jes M.D.   On: 10/10/2023 11:51   MR LUMBAR SPINE WO CONTRAST Result Date: 10/10/2023 CLINICAL DATA:  MRSA bacteremia and lumbar spine tenderness EXAM: MRI LUMBAR SPINE WITHOUT CONTRAST TECHNIQUE: Multiplanar, multisequence MR imaging of the lumbar spine was  performed. No intravenous contrast was administered. COMPARISON:  None Available.  FINDINGS: Segmentation:  Standard Alignment:  Normal Vertebrae:  No fracture, evidence of discitis, or bone lesion. Conus medullaris and cauda equina: Conus extends to the L1 level. Conus and cauda equina appear normal. Paraspinal and other soft tissues: Negative Disc levels: L1-L2: Normal disc space and facet joints. No spinal canal stenosis. No neural foraminal stenosis. L2-L3: Normal disc space and facet joints. No spinal canal stenosis. No neural foraminal stenosis. L3-L4: Normal disc space and facet joints. No spinal canal stenosis. No neural foraminal stenosis. L4-L5: Normal disc space and facet joints. No spinal canal stenosis. No neural foraminal stenosis. L5-S1: Normal disc space and facet joints. No spinal canal stenosis. No neural foraminal stenosis. Visualized sacrum: Normal. IMPRESSION: Normal MRI of the lumbar spine. Electronically Signed   By: Juanetta Nordmann M.D.   On: 10/10/2023 02:38   DG Lumbar Spine 2-3 Views Result Date: 10/09/2023 CLINICAL DATA:  Low back pain radiating into the legs. EXAM: LUMBAR SPINE - 3 VIEW COMPARISON:  None Available. FINDINGS: Five lumbar type vertebral bodies are well visualized. Vertebral body height is well maintained. No anterolisthesis is seen. Extensive arterial calcifications are noted. No acute soft tissue abnormality is seen. IMPRESSION: No acute abnormality noted. Electronically Signed   By: Violeta Grey M.D.   On: 10/09/2023 09:40   DG CHEST PORT 1 VIEW Result Date: 10/09/2023 CLINICAL DATA:  Dyspnea. EXAM: PORTABLE CHEST 1 VIEW COMPARISON:  08/01/2022 FINDINGS: The cardiopericardial silhouette is within normal limits for size. New patchy/nodular densities are seen clustered in the right lower lung. Stable streaky opacity at the left base consistent with chronic atelectasis or scarring. No pulmonary edema or pleural effusion. Right IJ central line tip overlies the right  atrium. Telemetry leads overlie the chest. IMPRESSION: New patchy/nodular densities clustered in the right lower lung. Imaging features are nonspecific and could be infectious/inflammatory. Follow-up chest radiograph recommended to ensure resolution. Electronically Signed   By: Donnal Fusi M.D.   On: 10/09/2023 08:05   US  Abdomen Limited RUQ (LIVER/GB) Result Date: 10/08/2023 CLINICAL DATA:  Epigastric pain. EXAM: ULTRASOUND ABDOMEN LIMITED RIGHT UPPER QUADRANT COMPARISON:  10/08/2023, 09/28/2019 at. FINDINGS: Gallbladder: Echogenic material is present in the dependent portion of the gallbladder. There is mild gallbladder wall thickening at 4.6 mm. No sonographic Murphy sign noted by sonographer. Common bile duct: Diameter: 14.8 mm Liver: No focal lesion identified. Within normal limits in parenchymal echogenicity. Portal vein is patent on color Doppler imaging with normal direction of blood flow towards the liver. Other: Shadowing calcifications in the right kidney are noted, compatible with no vascular calcifications. Increased renal parenchymal echogenicity is noted. IMPRESSION: 1. Echogenic material in the gallbladder, possible sludge with mild gallbladder wall thickening. No sonographic Abigail Abler sign is see to suggest acute cholecystitis. 2. Distended common bile duct measuring 14.8 mm, minimally increased from 2021 and may be chronic. 3. Increased renal echogenicity on the right, suggesting medical renal disease. Electronically Signed   By: Wyvonnia Heimlich M.D.   On: 10/08/2023 20:41   CT ABDOMEN PELVIS WO CONTRAST Result Date: 10/08/2023 CLINICAL DATA:  LLQ abdominal pain RLQ abdominal pain EXAM: CT ABDOMEN AND PELVIS WITHOUT CONTRAST TECHNIQUE: Multidetector CT imaging of the abdomen and pelvis was performed following the standard protocol without IV contrast. RADIATION DOSE REDUCTION: This exam was performed according to the departmental dose-optimization program which includes automated exposure  control, adjustment of the mA and/or kV according to patient size and/or use of iterative reconstruction technique. COMPARISON:  CT abdomen pelvis 09/27/2019, ultrasound abdomen 09/28/2019  FINDINGS: Lower chest: No acute abnormality. Similar-appearing bilateral lower lobe atelectasis versus scarring. Hepatobiliary: Liver is enlarged measuring up to 20 cm. No focal liver abnormality. No gallstones, gallbladder wall thickening, or pericholecystic fluid. Common bile duct dilatation measuring up to 14 mm. Hydropic gallbladder. No definite intrahepatic biliary ductal dilatation. Pancreas: No focal lesion. Normal pancreatic contour. No surrounding inflammatory changes. No main pancreatic ductal dilatation. Spleen: Normal in size without focal abnormality. Adrenals/Urinary Tract: No adrenal nodule bilaterally. No nephrolithiasis and no hydronephrosis. No definite contour-deforming renal mass. No ureterolithiasis or hydroureter. The urinary bladder is unremarkable. Stomach/Bowel: Stomach is within normal limits. No evidence of bowel wall thickening or dilatation. Prominent rectosigmoid wall likely due to under distension. Under distended bowel. Appendix appears normal. Vascular/Lymphatic: No abdominal aorta or iliac aneurysm. Moderate to severe atherosclerotic plaque of the aorta and its branches. No abdominal, pelvic, or inguinal lymphadenopathy. Reproductive: Prostate is unremarkable. Other: No intraperitoneal free fluid. No intraperitoneal free gas. No organized fluid collection. Musculoskeletal: No abdominal wall hernia or abnormality. No suspicious lytic or blastic osseous lesions. No acute displaced fracture. Multilevel degenerative changes of the spine. IMPRESSION: 1. Hydropic gallbladder with common bile duct dilatation-limited evaluation on this noncontrast study. Correlate with liver function tests and consider right upper quadrant ultrasound. 2. Hepatomegaly. 3. Limited evaluation on this noncontrast study.  Electronically Signed   By: Morgane  Naveau M.D.   On: 10/08/2023 19:23   DG Hand Complete Left Result Date: 10/08/2023 CLINICAL DATA:  L ring finger infection / pain EXAM: LEFT HAND - COMPLETE 3+ VIEW COMPARISON:  None Available. FINDINGS: Osteopenia. Extensive peripheral vascular atherosclerosis. Bony cortical destruction of the fourth digit involving the distal phalanx and the distal segment of the middle phalanx. Soft tissue swelling fourth phalanx. IMPRESSION: Soft tissue swelling throughout the fourth phalanx. Bony cortical destruction involving the fourth mid and distal phalanges, most consistent with osteomyelitis. Electronically Signed   By: Rance Burrows M.D.   On: 10/08/2023 18:49    Labs:  CBC: Recent Labs    10/11/23 0627 10/12/23 0608 10/13/23 0646 10/14/23 0822  WBC 12.6* 11.9* 13.8* 14.3*  HGB 10.0* 11.1* 11.1* 11.0*  HCT 31.7* 32.1* 32.7* 32.9*  PLT 75* 83* 91* 110*    COAGS: Recent Labs    10/08/23 1947  INR 1.2  APTT 39*    BMP: Recent Labs    10/11/23 0627 10/12/23 0608 10/13/23 0646 10/14/23 0822  NA 132* 128* 129* 129*  K 4.4 3.9 4.5 5.1  CL 95* 94* 93* 92*  CO2 20* 22 17* 15*  GLUCOSE 200* 226* 198* 202*  BUN 35* 58* 79* 98*  CALCIUM  9.0 9.1 9.1 9.2  CREATININE 6.43* 8.00* 9.92* 11.91*  GFRNONAA 10* 7* 6* 5*    LIVER FUNCTION TESTS: Recent Labs    10/11/23 0627 10/12/23 0608 10/13/23 0646 10/14/23 0822  BILITOT 1.5* 1.4* 1.2 1.4*  AST 31 33 27 23  ALT 20 21 18 16   ALKPHOS 411* 488* 432* 409*  PROT 6.7 6.9 6.8 7.3  ALBUMIN  2.2* 2.2* 2.1* 2.2*    TUMOR MARKERS: No results for input(s): "AFPTM", "CEA", "CA199", "CHROMGRNA" in the last 8760 hours.  Assessment and Plan:  Alvin Daniels is a 53 y.o. male with pmhx ESRD on HD MWF, uncontrolled type 2 diabetes, HIV, chronic hepatitis C, and s/p bilateral BKA. Initially presented to ED 10/08/23 for abdominal pain and nausea/vomiting along with fevers/chills. Upon  evaluation, found to have swelling pain and drainage of left 4th finger requiring amputation with  orthopedics on 5/29.  Pt has had prior right sided tunneled HD cath and reports compliance with dialysis prior. Had tunneled line pulled 10/12/23 secondary to MRSA bacteremia for line holiday. New blood cultures drawn 10/13/23 with no growth so far. Nephrology and ID cleared patient for re-placement of tunneled dialysis catheter for ongoing hemodialysis need.   Evaluation today performed with spanish interpreter on iPad at bedside.   Risks and benefits discussed with the patient including, but not limited to bleeding, infection, vascular injury, pneumothorax which may require chest tube placement, air embolism or even death. All of the patient's questions were answered, patient is agreeable to proceed. Consent signed and in chart.   Thank you for allowing our service to participate in Alvin Daniels 's care.  Electronically Signed: Nicolasa Barrett, PA-C   10/14/2023, 11:02 AM      I spent a total of 40 Minutes    in face to face in clinical consultation, greater than 50% of which was counseling/coordinating care for tunneled hemodialysis catheter placement.

## 2023-10-14 NOTE — Progress Notes (Signed)
   10/14/23 1735  Vitals  Temp 97.6 F (36.4 C)  Pulse Rate 83  Resp 17  BP 126/81  SpO2 97 %  O2 Device Room Air  Weight 47 kg  Type of Weight Post-Dialysis  Oxygen Therapy  Patient Activity (if Appropriate) In bed  Pulse Oximetry Type Continuous  Post Treatment  Dialyzer Clearance Clear  Hemodialysis Intake (mL) 0 mL  Liters Processed 84  Fluid Removed (mL) 1000 mL  Tolerated HD Treatment Yes   Received patient in bed to unit.  Alert and oriented.  Informed consent signed and in chart.   TX duration:3.5HRS  Patient tolerated well.  Transported back to the room  Alert, without acute distress.  Hand-off given to patient's nurse.   Access used: [facility] Access issues: SKIN BREAKDOWN AROUND SITE  Total UF removed: 1L Medication(s) given: VANC    [facility]

## 2023-10-14 NOTE — Progress Notes (Signed)
 [facility] Progress Note   Subjective:   Seen in room with video interpreter. He reports he is having hip and back pain today. Denies SOB, CP, dizziness. Discussed plan for TEE and new TDC today, he is agreeable.   Objective Vitals:   10/13/23 1559 10/14/23 0039 10/14/23 0530 10/14/23 0802  BP: (!) 84/53 (!) 91/49 (!) 80/55 (!) 96/40  Pulse: 76 78  79  Resp: 14 15 18    Temp: (!) 97.3 F (36.3 C) 98.5 F (36.9 C) 98.3 F (36.8 C) 97.6 F (36.4 C)  TempSrc: Oral  Oral Oral  SpO2: 99% 97% 98% 98%  Weight:      Height:       Physical Exam  General: Alert male in NAD Heart: RRR, no murmurs, rubs or gallops Lungs: CTA bilaterally, respirations unlabored on RA Abdomen: Soft, non-distended, +BS Extremities: B/l BKA, no edema noted Dialysis Access:  Bandage over prior [facility] site  Additional Objective Labs: Basic Metabolic Panel: Recent Labs  Lab 10/09/23 0631 10/10/23 5784 10/10/23 1218 10/11/23 0627 10/12/23 0608 10/13/23 0646  NA 134*   < > 135 132* 128* 129*  K 3.8   < > 3.7 4.4 3.9 4.5  CL 101   < > 95* 95* 94* 93*  CO2 21*   < > 21* 20* 22 17*  GLUCOSE 149*   < > 131* 200* 226* 198*  BUN 33*   < > 15 35* 58* 79*  CREATININE 7.78*   < > 3.77* 6.43* 8.00* 9.92*  CALCIUM  8.5*   < > 8.8* 9.0 9.1 9.1  PHOS 4.5  --  2.1*  --   --   --    < > = values in this interval not displayed.   Liver Function Tests: Recent Labs  Lab 10/11/23 0627 10/12/23 0608 10/13/23 0646  AST 31 33 27  ALT 20 21 18   ALKPHOS 411* 488* 432*  BILITOT 1.5* 1.4* 1.2  PROT 6.7 6.9 6.8  ALBUMIN  2.2* 2.2* 2.1*   Recent Labs  Lab 10/08/23 1634  LIPASE 32   CBC: Recent Labs  Lab 10/08/23 1634 10/09/23 0631 10/10/23 1218 10/11/23 0627 10/12/23 0608 10/13/23 0646 10/14/23 0822  WBC 12.2*   < > 14.2* 12.6* 11.9* 13.8* 14.3*  NEUTROABS 10.7*  --   --   --   --   --   --   HGB 10.6*   < > 9.7* 10.0* 11.1* 11.1* 11.0*  HCT 31.2*   < > 28.7* 31.7* 32.1* 32.7* 32.9*   MCV 97.2   < > 99.3 103.9* 96.4 96.7 95.6  PLT 87*   < > 78* 75* 83* 91* 110*   < > = values in this interval not displayed.   Blood Culture    Component Value Date/Time   SDES BLOOD RIGHT HAND 10/13/2023 1352   SPECREQUEST  10/13/2023 1352    BOTTLES DRAWN AEROBIC AND ANAEROBIC Blood Culture adequate volume   CULT  10/13/2023 1352    NO GROWTH < 24 HOURS Performed at [facility], [address], [place], [place] [address]    REPTSTATUS PENDING 10/13/2023 1352    Cardiac Enzymes: No results for input(s): "CKTOTAL", "CKMB", "CKMBINDEX", "TROPONINI" in the last 168 hours. CBG: Recent Labs  Lab 10/13/23 1152 10/13/23 1630 10/13/23 2222 10/13/23 2225 10/14/23 0711  GLUCAP 140* 150* 187* 188* 198*   Iron  Studies: No results for input(s): "IRON ", "TIBC", "TRANSFERRIN", "FERRITIN" in the last 72 hours. @lablastinr3 @ Studies/Results: IR  Removal Tun Cv Cath W/O FL Result Date: 10/12/2023 INDICATION: Patient with a history of end-stage renal disease receiving hemodialysis via a right IJ tunnel dialysis catheter placed by nephrology September 01, 2023. Patient now with bacteremia. Interventional radiology asked to remove line for a line holiday. EXAM: REMOVAL TUNNELED CENTRAL VENOUS CATHETER MEDICATIONS: 1% lidocaine  8 mL ANESTHESIA/SEDATION: None FLUOROSCOPY: None COMPLICATIONS: None immediate. PROCEDURE: Informed written consent was obtained from the patient after a thorough discussion of the procedural risks, benefits and alternatives. All questions were addressed. Maximal Sterile Barrier Technique was utilized including caps, mask, sterile gowns, sterile gloves, sterile drape, hand hygiene and skin antiseptic. A timeout was performed prior to the initiation of the procedure. The patient's right chest and catheter was prepped and draped in a normal sterile fashion. Heparin  was removed from both ports of catheter. 1% lidocaine  was used for local anesthesia. Using gentle blunt dissection  and mild manual traction the cuff of the catheter was exposed and the catheter was removed in it's entirety. Pressure was held till hemostasis was obtained. A sterile dressing was applied. The patient tolerated the procedure well with no immediate complications. IMPRESSION: Successful catheter removal as described above. Procedure performed by Jetta Morrow, NP Electronically Signed   By: Elene Griffes M.D.   On: 10/12/2023 19:05   ECHOCARDIOGRAM COMPLETE Result Date: 10/12/2023    ECHOCARDIOGRAM REPORT   Patient Name:   Alvin Daniels Date of Exam: 10/12/2023 Medical Rec #:  [id number]                        Height:       59.0 in Accession #:    [id number]                       Weight:       104.7 lb Date of Birth:  [birth year]                         BSA:          1.401 m Patient Age:    53 years                         BP:           106/68 mmHg Patient Gender: M                                HR:           76 bpm. Exam Location:  Inpatient Procedure: 2D Echo, Cardiac Doppler and Color Doppler (Both Spectral and Color            Flow Doppler were utilized during procedure). Indications:    Endocarditis  History:        Patient has prior history of Echocardiogram examinations, most                 recent 02/29/2020.  Sonographer:    Andrena Bang Referring Phys: [id number] TRUNG T VU IMPRESSIONS  1. Left ventricular ejection fraction, by estimation, is 60 to 65%. The left ventricle has normal function. The left ventricle has no regional wall motion abnormalities. Left ventricular diastolic parameters are indeterminate.  2. Right ventricular systolic function is normal. The right ventricular size is normal. Tricuspid regurgitation signal is inadequate for assessing PA pressure.  3. Catheter noted  in the right atrium with semi mobile densities attached concerning for possible vegetations.  4. The mitral valve is normal in structure. No evidence of mitral valve regurgitation. No evidence of mitral stenosis.   5. The aortic valve is tricuspid. Aortic valve regurgitation is not visualized. No aortic stenosis is present.  6. The inferior vena cava is normal in size with greater than 50% respiratory variability, suggesting right atrial pressure of 3 mmHg. FINDINGS  Left Ventricle: Left ventricular ejection fraction, by estimation, is 60 to 65%. The left ventricle has normal function. The left ventricle has no regional wall motion abnormalities. The left ventricular internal cavity size was normal in size. There is  no left ventricular hypertrophy. Left ventricular diastolic parameters are indeterminate. Right Ventricle: The right ventricular size is normal. Right vetricular wall thickness was not well visualized. Right ventricular systolic function is normal. Tricuspid regurgitation signal is inadequate for assessing PA pressure. Left Atrium: Left atrial size was normal in size. Right Atrium: Catheter noted in the right atrium with semi mobile densities attached concerning for possible vegetations. Right atrial size was normal in size. Pericardium: Incidental finding of hepatic cyst. There is no evidence of pericardial effusion. Mitral Valve: The mitral valve is normal in structure. No evidence of mitral valve regurgitation. No evidence of mitral valve stenosis. Tricuspid Valve: The tricuspid valve is normal in structure. Tricuspid valve regurgitation is not demonstrated. No evidence of tricuspid stenosis. Aortic Valve: The aortic valve is tricuspid. Aortic valve regurgitation is not visualized. No aortic stenosis is present. Aortic valve mean gradient measures 3.0 mmHg. Aortic valve peak gradient measures 5.4 mmHg. Aortic valve area, by VTI measures 2.54 cm. Pulmonic Valve: The pulmonic valve was not well visualized. Pulmonic valve regurgitation is not visualized. No evidence of pulmonic stenosis. Aorta: The aortic root and ascending aorta are structurally normal, with no evidence of dilitation. Venous: The inferior vena  cava is normal in size with greater than 50% respiratory variability, suggesting right atrial pressure of 3 mmHg. IAS/Shunts: No atrial level shunt detected by color flow Doppler.  LEFT VENTRICLE PLAX 2D LVIDd:         3.57 cm     Diastology LVIDs:         2.45 cm     LV e' medial:    5.00 cm/s LV PW:         1.00 cm     LV E/e' medial:  15.5 LV IVS:        1.03 cm     LV e' lateral:   8.16 cm/s LVOT diam:     2.00 cm     LV E/e' lateral: 9.5 LV SV:         55 LV SV Index:   39 LVOT Area:     3.14 cm  LV Volumes (MOD) LV vol d, MOD A2C: 55.3 ml LV vol d, MOD A4C: 52.7 ml LV vol s, MOD A2C: 16.9 ml LV vol s, MOD A4C: 22.1 ml LV SV MOD A2C:     38.4 ml LV SV MOD A4C:     52.7 ml LV SV MOD BP:      34.7 ml RIGHT VENTRICLE RV S prime:     11.50 cm/s TAPSE (M-mode): 1.8 cm LEFT ATRIUM           Index LA diam:      2.45 cm 1.75 cm/m LA Vol (A2C): 25.1 ml 17.92 ml/m LA Vol (A4C): 17.2 ml 12.28 ml/m  AORTIC VALVE  AV Area (Vmax):    2.64 cm AV Area (Vmean):   2.36 cm AV Area (VTI):     2.54 cm AV Vmax:           116.00 cm/s AV Vmean:          72.500 cm/s AV VTI:            0.218 m AV Peak Grad:      5.4 mmHg AV Mean Grad:      3.0 mmHg LVOT Vmax:         97.40 cm/s LVOT Vmean:        54.400 cm/s LVOT VTI:          0.176 m LVOT/AV VTI ratio: 0.81  AORTA Ao Root diam: 3.10 cm Ao Asc diam:  3.40 cm MITRAL VALVE MV Area (PHT): 2.69 cm    SHUNTS MV Decel Time: 282 msec    Systemic VTI:  0.18 m MV E velocity: 77.40 cm/s  Systemic Diam: 2.00 cm MV A velocity: 69.80 cm/s MV E/A ratio:  1.11 Armida Lander MD Electronically signed by Armida Lander MD Signature Date/Time: 10/12/2023/12:38:19 PM    Final    Medications:   bictegravir-emtricitabine -tenofovir  AF  1 tablet Oral Daily   Chlorhexidine  Gluconate Cloth  6 each Topical Q0600   heparin   5,000 Units Subcutaneous Q8H   insulin  aspart  0-5 Units Subcutaneous QHS   insulin  aspart  0-9 Units Subcutaneous TID WC   insulin  aspart protamine - aspart  5 Units  Subcutaneous BID WC   lidocaine   1 patch Transdermal Q24H   lidocaine  (PF)  10 mL Infiltration Once   midodrine   10 mg Oral TID   sodium chloride  flush  3-10 mL Intravenous Q12H   vancomycin  variable dose per unstable renal function (pharmacist dosing)   Does not apply See admin instructions    Dialysis Orders:  [facility] MWF 3.5h  B400  45.5kg   TDC   Heparin  2000 Last OP HD 5/28, post wt 45.7kg Very small wt gains, always makes it to dry wt  Assessment/Plan: MRSA bacteremia.  IV Vancomycin  started. Had [facility] in place,  also w finger necrosis. ID consulted. Needs line holiday. IR removed TDC for line holiday 6/1, plan to replace today Finger infection. Osteo on imaging. 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  ESRD: on HD MWF.  Last HD 5/30. HD cath removed 6/1, planning to replace today with HD liekly this evening Chronic hypotension: BP's at OP HD as typically low in the 80's-90s or lower during the sessions.  Volume Tolerated 1.9L UF on 5/30. Appears euvolemic, 2kg over EDW today.  Anemia of esrd: Hgb 10-11, follow.  Secondary hyperparathyroidism: Calcium /Phos acceptable. Continue home meds.  HIV. On Bictarvy.   Ramona Burner, PA-C 10/14/2023, 10:06 AM  [facility] Pager: [phone]

## 2023-10-14 NOTE — Procedures (Signed)
 Interventional Radiology Procedure Note  Procedure: Tunneled hemodialysis catheter placement  Findings: Please refer to procedural dictation for full description. 19 cm tunneled hemodialysis catheter placement.  Complications: None immediate  Estimated Blood Loss: <5 mL  Recommendations: Catheter ready for immediate use.   Creasie Doctor, MD

## 2023-10-14 NOTE — Progress Notes (Signed)
 Pharmacy Antibiotic Note  Alvin Daniels is a 53 y.o. male admitted on 10/08/2023 with MRSA bacteremia  Pharmacy has been consulted for Vancomycin  dosing.  The patient is ESRD-MWF however last HD 5/30 and TDC removed 6/1 after TTE noted possible vegetations on catheter tip. TDC placed today, 6/3, with plans for HD.  A vancomycin  random level this AM was 17 mcg/ml - will resumed dosing with HD today.   Plan: - Vancomycin  500 mg IV x 1 dose today with HD - communicated dose with HD-RN - No standing Vancomycin  for now - will f/u HD plans/schedule - Will continue to follow HD schedule/duration, culture results, LOT, and antibiotic de-escalation plans   Height: 4\' 11"  (149.9 cm) Weight: 47.2 kg (104 lb 0.9 oz) IBW/kg (Calculated) : 47.7  Temp (24hrs), Avg:97.8 F (36.6 C), Min:97.3 F (36.3 C), Max:98.5 F (36.9 C)  Recent Labs  Lab 10/08/23 1818 10/08/23 2001 10/09/23 0631 10/10/23 1218 10/11/23 0627 10/12/23 0608 10/13/23 0646 10/14/23 0822  WBC  --   --    < > 14.2* 12.6* 11.9* 13.8* 14.3*  CREATININE  --   --    < > 3.77* 6.43* 8.00* 9.92* 11.91*  LATICACIDVEN 1.3 0.9  --   --   --   --   --   --   VANCORANDOM  --   --   --   --  21  --   --  17   < > = values in this interval not displayed.    Estimated Creatinine Clearance: 4.8 mL/min (A) (by C-G formula based on SCr of 11.91 mg/dL (H)).    No Known Allergies  Antimicrobials this admission: Vancomycin  5/28 >> Zosyn  5/28 x 1 CRO 5/29 >> 5/30  Dose adjustments this admission: 5/31 VR 21 mcg/ml 6/3 VR 17 mcg/ml  Microbiology results: 5/28 BCx >> MRSA 5/29 L-finger abscess >> E.coli + strep anginosus + B frag (not treating, s/p amputation) 6/2 BCx >> ng<24h  Thank you for allowing pharmacy to be a part of this patient's care.  Garland Junk, PharmD, BCPS, BCIDP Infectious Diseases Clinical Pharmacist 10/14/2023 4:05 PM   **Pharmacist phone directory can now be found on [email or web address] ([email or web address]).   Listed under [facility].

## 2023-10-14 NOTE — Addendum Note (Signed)
 Addendum  created 10/14/23 1659 by Tura Gaines, MD   SmartForm saved

## 2023-10-14 NOTE — H&P (View-Only) (Signed)
 PROGRESS NOTE Alvin Daniels  QIO:[id number] DOB: 1970/11/15 DOA: 10/08/2023 PCP: [name], MD  Brief Narrative/Hospital Course: a 53 y.o. male with medical history significant for ESRD on HD MWF, uncontrolled type 2 diabetes, HIV, chronic hepatitis C, and s/p bilateral BKA who presented to the ED for evaluation of nausea/vomiting and cramping abdominal pain for a week feeling feverish since Saturday, having pain in his low back afebrile and lower abdomen and admitted for sepsis secondary to left fourth finger osteomyelitis. In the ED hypotensive 78/52, not hypoxic.  Labs showed hypokalemia 3.1, normal lactic acid 1.3> 0.9, leukocytosis 12 anemia 10.6 thrombocytopenia 87 MRSA PCR positive , elevated but flat troponin 53> 54 Blood culture sent,, Started on vancomycin  and ceftriaxone  Blood cx 4/4 MRSA- antibiotics changed to vanco alone. 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 6/1> echo showed vegetation on the tip of the catheter, HD catheter subsequently went by IR  Subjective: Patient seen and examined Still complains of some right hip pain Overnight events stable-no fever BP remains soft 80s-96 systolic Cbg ok 190s Npo for HD line today  Assessment and plan:  Severe Sepsis POA 2/2 Osteomyelitis of the left fourth finger HD catheter team visitation on TTE MRSA bacteremia 4/4 likely form above vs HD line infection Chronic Hypotension Back pain/shoulder pain-Mri shoulder , L spine negative Right Hip pain: 1 month of worsening L 4th finger-xray w/ osteomyelitis of the fourth mid and distal phalanges- Intra-op cx w/ e coli, and streps 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 BP soft chronically in 70s to 80s, lactic acid normal-patient now on midodrine  3 times daily MRSA sepsis/bacteremia source HD cath  versus left finger. ID ortho nephrology following 6/1> echo showed vegetation on the tip of the catheter, HD catheter removed  and for new HD line 6/4 Continue contact isolation, continue vancomycin . TEE planned 6/3 then HD line. Follow-up MRI of right hip   Chronic hypotension: BP remains soft continue on midodrine   TID, PTA on prn  Elevated alk phos Gallbladder sludge: ALP 261, mildly elevated AST to 62 and bilirubin of 1.4.RUQ U/S shows some gallbladder sludge but no acute cholecystitis, CBD minimally increased from 2021 No RUQ pain.  Check labs intermittently    ESRD on HD MWF Anemia of chronic disease Mild hypokalemia Metabolic bone disease: Nephrology following continue HD as #1- HD cath out 6/1->with line holiday plan for new HD catheter  6/3.    Type 2 diabetes with hyperglycemia: Last A1c 10.0% on 07/17/2023, PTA on NovoLog  70/30 at 15 units bid- cont at lower dose 5 units BID, and  SSI ACHS Recent Labs  Lab 10/13/23 1152 10/13/23 1630 10/13/23 2222 10/13/23 2225 10/14/23 0711  GLUCAP 140* 150* 187* 188* 198*   HIV: Last CD4 count 315, viral load 27 on 04/16/23 now < 20, CD4 254 from 315. Continue Biktarvy  per ID  Thrombocytopenia, chronic: Monitor platelet counts.   Bilateral BKA: Cont b/l prosthetics   Discussed plan of care with IR nephrology and ID.  DVT prophylaxis: heparin  injection 5,000 Units Start: 10/09/23 0030 Code Status:   Code Status: Full Code Family Communication: plan of care discussed with patient at bedside. Patient status is: Remains hospitalized because of severity of illness Level of care: Telemetry Medical   Dispo: The patient is from: home  Anticipated disposition: TBD Objective: Vitals last 24 hrs: Vitals:   10/13/23 1559 10/14/23 0039 10/14/23 0530 10/14/23 0802  BP: (!) 84/53 (!) 91/49 (!) 80/55 (!) 96/40  Pulse: 76 78  79  Resp: 14 15 18    Temp: (!) 97.3 F (36.3 C) 98.5 F (36.9 C) 98.3 F (36.8 C) 97.6 F (36.4  C)  TempSrc: Oral  Oral Oral  SpO2: 99% 97% 98% 98%  Weight:      Height:        Physical Examination: General exam: alert awake, oriented at baseline, older than stated age HEENT:Oral mucosa moist, Ear/Nose WNL grossly Respiratory system: Bilaterally clear BS,no use of accessory muscle Cardiovascular system: S1 & S2 +, No JVD. Chest wall dressing+ Gastrointestinal system: Abdomen soft,NT,ND, BS+ Nervous System: Alert, awake, moving all extremities,and following commands. Extremities: b/l BKA, Skin: No rashes,no icterus. MSK: Normal muscle bulk,tone, power   Data Reviewed: I have personally reviewed following labs and imaging studies ( see epic result tab) CBC: Recent Labs  Lab 10/08/23 1634 10/09/23 0631 10/10/23 1218 10/11/23 0627 10/12/23 0608 10/13/23 0646 10/14/23 0822  WBC 12.2*   < > 14.2* 12.6* 11.9* 13.8* 14.3*  NEUTROABS 10.7*  --   --   --   --   --   --   HGB 10.6*   < > 9.7* 10.0* 11.1* 11.1* 11.0*  HCT 31.2*   < > 28.7* 31.7* 32.1* 32.7* 32.9*  MCV 97.2   < > 99.3 103.9* 96.4 96.7 95.6  PLT 87*   < > 78* 75* 83* 91* 110*   < > = values in this interval not displayed.   CMP: Recent Labs  Lab 10/08/23 1947 10/09/23 1610 10/10/23 9604 10/10/23 1218 10/11/23 5409 10/12/23 8119 10/13/23 0646 10/14/23 0822  NA  --  134*   < > 135 132* 128* 129* 129*  K  --  3.8   < > 3.7 4.4 3.9 4.5 5.1  CL  --  101   < > 95* 95* 94* 93* 92*  CO2  --  21*   < > 21* 20* 22 17* 15*  GLUCOSE  --  149*   < > 131* 200* 226* 198* 202*  BUN  --  33*   < > 15 35* 58* 79* 98*  CREATININE  --  7.78*   < > 3.77* 6.43* 8.00* 9.92* 11.91*  CALCIUM   --  8.5*   < > 8.8* 9.0 9.1 9.1 9.2  MG 2.1  --   --   --   --   --   --   --   PHOS  --  4.5  --  2.1*  --   --   --   --    < > = values in this interval not displayed.   GFR: Estimated Creatinine Clearance: 4.9 mL/min (A) (by C-G formula based on SCr of 11.91 mg/dL (H)). Recent Labs  Lab 10/10/23 0628 10/10/23 1218  10/11/23 0627 10/12/23 0608 10/13/23 0646 10/14/23 0822  AST 31  --  31 33 27 23  ALT 22  --  20 21 18 16   ALKPHOS 315*  --  411* 488* 432* 409*  BILITOT 1.3*  --  1.5* 1.4* 1.2 1.4*  PROT 6.7  --  6.7 6.9 6.8 7.3  ALBUMIN  2.3* 2.6* 2.2* 2.2* 2.1* 2.2*    Recent Labs  Lab 10/08/23 1634  LIPASE 32   No results for input(s): "AMMONIA" in the last 168 hours.  Coagulation Profile:  Recent Labs  Lab 10/08/23 1947  INR 1.2   Unresulted Labs (From admission, onward)     Start     Ordered   10/08/23 1634  Urinalysis, Routine w reflex microscopic -Urine, Clean Catch  (ED Abdominal Pain)  Once,   URGENT       Question:  Specimen Source  Answer:  Urine, Clean Catch   10/08/23 1633   Signed and Held  Renal function panel  Once,   R       Question:  Specimen collection method  Answer:  Lab=Lab collect   Signed and Held   Signed and Held  CBC  Once,   R       Question:  Specimen collection method  Answer:  Lab=Lab collect   Signed and Held           Antimicrobials/Microbiology: Anti-infectives (From admission, onward)    Start     Dose/Rate Route Frequency Ordered Stop   10/12/23 0758  vancomycin  variable dose per unstable renal function (pharmacist dosing)         Does not apply See admin instructions 10/12/23 0758     10/10/23 1200  vancomycin  (VANCOCIN ) 500 mg in sodium chloride  0.9 % 100 mL IVPB  Status:  Discontinued        500 mg 110 mL/hr over 60 Minutes Intravenous Every M-W-F (Hemodialysis) 10/09/23 0207 10/12/23 0758   10/09/23 1000  bictegravir-emtricitabine -tenofovir  AF (BIKTARVY ) 50-200-25 MG per tablet 1 tablet        1 tablet Oral Daily 10/09/23 0028     10/09/23 1000  cefTRIAXone  (ROCEPHIN ) 2 g in sodium chloride  0.9 % 100 mL IVPB  Status:  Discontinued        2 g 200 mL/hr over 30 Minutes Intravenous Every 24 hours 10/09/23 0156 10/10/23 1557   10/08/23 2000  vancomycin  (VANCOCIN ) IVPB 1000 mg/200 mL premix        1,000 mg 200 mL/hr over 60 Minutes  Intravenous  Once 10/08/23 1958 10/08/23 2314   10/08/23 2000  piperacillin -tazobactam (ZOSYN ) IVPB 2.25 g        2.25 g 100 mL/hr over 30 Minutes Intravenous Once 10/08/23 1958 10/09/23 0851         Component Value Date/Time   SDES BLOOD RIGHT HAND 10/13/2023 1352   SPECREQUEST  10/13/2023 1352    BOTTLES DRAWN AEROBIC AND ANAEROBIC Blood Culture adequate volume   CULT  10/13/2023 1352    NO GROWTH < 24 HOURS Performed at [facility], [address], [place], [place] [address]    REPTSTATUS PENDING 10/13/2023 1352    Procedures: Procedure(s) (LRB): AMPUTATION, FINGER (Left) Medications reviewed:  Scheduled Meds:  bictegravir-emtricitabine -tenofovir  AF  1 tablet Oral Daily   Chlorhexidine  Gluconate Cloth  6 each Topical Q0600   heparin   5,000 Units Subcutaneous Q8H   insulin  aspart  0-5 Units Subcutaneous QHS   insulin  aspart  0-9 Units Subcutaneous TID WC   insulin  aspart protamine - aspart  5 Units Subcutaneous BID WC   lidocaine   1 patch Transdermal Q24H   lidocaine  (PF)  10 mL Infiltration Once   midodrine   10 mg Oral TID   sodium chloride  flush  3-10 mL Intravenous Q12H   vancomycin  variable dose per unstable renal function (pharmacist dosing)   Does not apply See admin instructions   Continuous Infusions:    Lesa Rape, MD [facility] 10/14/2023, 11:00 AM

## 2023-10-14 NOTE — Progress Notes (Addendum)
 PROGRESS NOTE Alvin Daniels  QIO:[id number] DOB: 1970/11/15 DOA: 10/08/2023 PCP: Alvin Abo, MD  Brief Narrative/Hospital Course: a 53 y.o. male with medical history significant for ESRD on HD MWF, uncontrolled type 2 diabetes, HIV, chronic hepatitis C, and s/p bilateral BKA who presented to the ED for evaluation of nausea/vomiting and cramping abdominal pain for a week feeling feverish since Saturday, having pain in his low back afebrile and lower abdomen and admitted for sepsis secondary to left fourth finger osteomyelitis. In the ED hypotensive 78/52, not hypoxic.  Labs showed hypokalemia 3.1, normal lactic acid 1.3> 0.9, leukocytosis 12 anemia 10.6 thrombocytopenia 87 MRSA PCR positive , elevated but flat troponin 53> 54 Blood culture sent,, Started on vancomycin  and ceftriaxone  Blood cx 4/4 MRSA- antibiotics changed to vanco alone. 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 6/1> echo showed vegetation on the tip of the catheter, HD catheter subsequently went by IR  Subjective: Patient seen and examined Still complains of some right hip pain Overnight events stable-no fever BP remains soft 80s-96 systolic Cbg ok 190s Npo for HD line today  Assessment and plan:  Severe Sepsis POA 2/2 Osteomyelitis of the left fourth finger HD catheter team visitation on TTE MRSA bacteremia 4/4 likely form above vs HD line infection Chronic Hypotension Back pain/shoulder pain-Mri shoulder , L spine negative Right Hip pain: 1 month of worsening L 4th finger-xray w/ osteomyelitis of the fourth mid and distal phalanges- Intra-op cx w/ e coli, and streps 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 BP soft chronically in 70s to 80s, lactic acid normal-patient now on midodrine  3 times daily MRSA sepsis/bacteremia source HD cath  versus left finger. ID ortho nephrology following 6/1> echo showed vegetation on the tip of the catheter, HD catheter removed  and for new HD line 6/4 Continue contact isolation, continue vancomycin . TEE planned 6/3 then HD line. Follow-up MRI of right hip   Chronic hypotension: BP remains soft continue on midodrine   TID, PTA on prn  Elevated alk phos Gallbladder sludge: ALP 261, mildly elevated AST to 62 and bilirubin of 1.4.RUQ U/S shows some gallbladder sludge but no acute cholecystitis, CBD minimally increased from 2021 No RUQ pain.  Check labs intermittently    ESRD on HD MWF Anemia of chronic disease Mild hypokalemia Metabolic bone disease: Nephrology following continue HD as #1- HD cath out 6/1->with line holiday plan for new HD catheter  6/3.    Type 2 diabetes with hyperglycemia: Last A1c 10.0% on 07/17/2023, PTA on NovoLog  70/30 at 15 units bid- cont at lower dose 5 units BID, and  SSI ACHS Recent Labs  Lab 10/13/23 1152 10/13/23 1630 10/13/23 2222 10/13/23 2225 10/14/23 0711  GLUCAP 140* 150* 187* 188* 198*   HIV: Last CD4 count 315, viral load 27 on 04/16/23 now < 20, CD4 254 from 315. Continue Biktarvy  per ID  Thrombocytopenia, chronic: Monitor platelet counts.   Bilateral BKA: Cont b/l prosthetics   Discussed plan of care with IR nephrology and ID.  DVT prophylaxis: heparin  injection 5,000 Units Start: 10/09/23 0030 Code Status:   Code Status: Full Code Family Communication: plan of care discussed with patient at bedside. Patient status is: Remains hospitalized because of severity of illness Level of care: Telemetry Medical   Dispo: The patient is from: home  Anticipated disposition: TBD Objective: Vitals last 24 hrs: Vitals:   10/13/23 1559 10/14/23 0039 10/14/23 0530 10/14/23 0802  BP: (!) 84/53 (!) 91/49 (!) 80/55 (!) 96/40  Pulse: 76 78  79  Resp: 14 15 18    Temp: (!) 97.3 F (36.3 C) 98.5 F (36.9 C) 98.3 F (36.8 C) 97.6 F (36.4  C)  TempSrc: Oral  Oral Oral  SpO2: 99% 97% 98% 98%  Weight:      Height:        Physical Examination: General exam: alert awake, oriented at baseline, older than stated age HEENT:Oral mucosa moist, Ear/Nose WNL grossly Respiratory system: Bilaterally clear BS,no use of accessory muscle Cardiovascular system: S1 & S2 +, No JVD. Chest wall dressing+ Gastrointestinal system: Abdomen soft,NT,ND, BS+ Nervous System: Alert, awake, moving all extremities,and following commands. Extremities: b/l BKA, Skin: No rashes,no icterus. MSK: Normal muscle bulk,tone, power   Data Reviewed: I have personally reviewed following labs and imaging studies ( see epic result tab) CBC: Recent Labs  Lab 10/08/23 1634 10/09/23 0631 10/10/23 1218 10/11/23 0627 10/12/23 0608 10/13/23 0646 10/14/23 0822  WBC 12.2*   < > 14.2* 12.6* 11.9* 13.8* 14.3*  NEUTROABS 10.7*  --   --   --   --   --   --   HGB 10.6*   < > 9.7* 10.0* 11.1* 11.1* 11.0*  HCT 31.2*   < > 28.7* 31.7* 32.1* 32.7* 32.9*  MCV 97.2   < > 99.3 103.9* 96.4 96.7 95.6  PLT 87*   < > 78* 75* 83* 91* 110*   < > = values in this interval not displayed.   CMP: Recent Labs  Lab 10/08/23 1947 10/09/23 1610 10/10/23 9604 10/10/23 1218 10/11/23 5409 10/12/23 8119 10/13/23 0646 10/14/23 0822  NA  --  134*   < > 135 132* 128* 129* 129*  K  --  3.8   < > 3.7 4.4 3.9 4.5 5.1  CL  --  101   < > 95* 95* 94* 93* 92*  CO2  --  21*   < > 21* 20* 22 17* 15*  GLUCOSE  --  149*   < > 131* 200* 226* 198* 202*  BUN  --  33*   < > 15 35* 58* 79* 98*  CREATININE  --  7.78*   < > 3.77* 6.43* 8.00* 9.92* 11.91*  CALCIUM   --  8.5*   < > 8.8* 9.0 9.1 9.1 9.2  MG 2.1  --   --   --   --   --   --   --   PHOS  --  4.5  --  2.1*  --   --   --   --    < > = values in this interval not displayed.   GFR: Estimated Creatinine Clearance: 4.9 mL/min (A) (by C-G formula based on SCr of 11.91 mg/dL (H)). Recent Labs  Lab 10/10/23 0628 10/10/23 1218  10/11/23 0627 10/12/23 0608 10/13/23 0646 10/14/23 0822  AST 31  --  31 33 27 23  ALT 22  --  20 21 18 16   ALKPHOS 315*  --  411* 488* 432* 409*  BILITOT 1.3*  --  1.5* 1.4* 1.2 1.4*  PROT 6.7  --  6.7 6.9 6.8 7.3  ALBUMIN  2.3* 2.6* 2.2* 2.2* 2.1* 2.2*    Recent Labs  Lab 10/08/23 1634  LIPASE 32   No results for input(s): "AMMONIA" in the last 168 hours.  Coagulation Profile:  Recent Labs  Lab 10/08/23 1947  INR 1.2   Unresulted Labs (From admission, onward)     Start     Ordered   10/08/23 1634  Urinalysis, Routine w reflex microscopic -Urine, Clean Catch  (ED Abdominal Pain)  Once,   URGENT       Question:  Specimen Source  Answer:  Urine, Clean Catch   10/08/23 1633   Signed and Held  Renal function panel  Once,   R       Question:  Specimen collection method  Answer:  Lab=Lab collect   Signed and Held   Signed and Held  CBC  Once,   R       Question:  Specimen collection method  Answer:  Lab=Lab collect   Signed and Held           Antimicrobials/Microbiology: Anti-infectives (From admission, onward)    Start     Dose/Rate Route Frequency Ordered Stop   10/12/23 0758  vancomycin  variable dose per unstable renal function (pharmacist dosing)         Does not apply See admin instructions 10/12/23 0758     10/10/23 1200  vancomycin  (VANCOCIN ) 500 mg in sodium chloride  0.9 % 100 mL IVPB  Status:  Discontinued        500 mg 110 mL/hr over 60 Minutes Intravenous Every M-W-F (Hemodialysis) 10/09/23 0207 10/12/23 0758   10/09/23 1000  bictegravir-emtricitabine -tenofovir  AF (BIKTARVY ) 50-200-25 MG per tablet 1 tablet        1 tablet Oral Daily 10/09/23 0028     10/09/23 1000  cefTRIAXone  (ROCEPHIN ) 2 g in sodium chloride  0.9 % 100 mL IVPB  Status:  Discontinued        2 g 200 mL/hr over 30 Minutes Intravenous Every 24 hours 10/09/23 0156 10/10/23 1557   10/08/23 2000  vancomycin  (VANCOCIN ) IVPB 1000 mg/200 mL premix        1,000 mg 200 mL/hr over 60 Minutes  Intravenous  Once 10/08/23 1958 10/08/23 2314   10/08/23 2000  piperacillin -tazobactam (ZOSYN ) IVPB 2.25 g        2.25 g 100 mL/hr over 30 Minutes Intravenous Once 10/08/23 1958 10/09/23 0851         Component Value Date/Time   SDES BLOOD RIGHT HAND 10/13/2023 1352   SPECREQUEST  10/13/2023 1352    BOTTLES DRAWN AEROBIC AND ANAEROBIC Blood Culture adequate volume   CULT  10/13/2023 1352    NO GROWTH < 24 HOURS Performed at [facility], [address], [place], [place] [address]    REPTSTATUS PENDING 10/13/2023 1352    Procedures: Procedure(s) (LRB): AMPUTATION, FINGER (Left) Medications reviewed:  Scheduled Meds:  bictegravir-emtricitabine -tenofovir  AF  1 tablet Oral Daily   Chlorhexidine  Gluconate Cloth  6 each Topical Q0600   heparin   5,000 Units Subcutaneous Q8H   insulin  aspart  0-5 Units Subcutaneous QHS   insulin  aspart  0-9 Units Subcutaneous TID WC   insulin  aspart protamine - aspart  5 Units Subcutaneous BID WC   lidocaine   1 patch Transdermal Q24H   lidocaine  (PF)  10 mL Infiltration Once   midodrine   10 mg Oral TID   sodium chloride  flush  3-10 mL Intravenous Q12H   vancomycin  variable dose per unstable renal function (pharmacist dosing)   Does not apply See admin instructions   Continuous Infusions:    Lesa Rape, MD [facility] 10/14/2023, 11:00 AM

## 2023-10-15 ENCOUNTER — Ambulatory Visit: Payer: Self-pay | Admitting: Infectious Disease

## 2023-10-15 ENCOUNTER — Encounter (HOSPITAL_COMMUNITY): Payer: Self-pay | Admitting: Cardiology

## 2023-10-15 ENCOUNTER — Inpatient Hospital Stay (HOSPITAL_COMMUNITY): Payer: Self-pay

## 2023-10-15 ENCOUNTER — Encounter (HOSPITAL_COMMUNITY)
Admission: EM | Disposition: A | Discharge status: Home / Self Care | Payer: Self-pay | Source: Home / Self Care | Attending: Internal Medicine

## 2023-10-15 DIAGNOSIS — Z992 Dependence on renal dialysis: Secondary | ICD-10-CM

## 2023-10-15 DIAGNOSIS — M86142 Other acute osteomyelitis, left hand: Secondary | ICD-10-CM

## 2023-10-15 DIAGNOSIS — R7881 Bacteremia: Secondary | ICD-10-CM

## 2023-10-15 DIAGNOSIS — N186 End stage renal disease: Secondary | ICD-10-CM

## 2023-10-15 DIAGNOSIS — I330 Acute and subacute infective endocarditis: Secondary | ICD-10-CM

## 2023-10-15 DIAGNOSIS — E1122 Type 2 diabetes mellitus with diabetic chronic kidney disease: Secondary | ICD-10-CM

## 2023-10-15 DIAGNOSIS — I38 Endocarditis, valve unspecified: Secondary | ICD-10-CM

## 2023-10-15 HISTORY — PX: TRANSESOPHAGEAL ECHOCARDIOGRAM (CATH LAB): EP1270

## 2023-10-15 LAB — CBC
HCT: 31.6 % — ABNORMAL LOW (ref 39.0–52.0)
Hemoglobin: 10.7 g/dL — ABNORMAL LOW (ref 13.0–17.0)
MCH: 33.2 pg (ref 26.0–34.0)
MCHC: 33.9 g/dL (ref 30.0–36.0)
MCV: 98.1 fL (ref 80.0–100.0)
Platelets: 129 10*3/uL — ABNORMAL LOW (ref 150–400)
RBC: 3.22 MIL/uL — ABNORMAL LOW (ref 4.22–5.81)
RDW: 15 % (ref 11.5–15.5)
WBC: 10.8 10*3/uL — ABNORMAL HIGH (ref 4.0–10.5)
nRBC: 0 % (ref 0.0–0.2)

## 2023-10-15 LAB — RENAL FUNCTION PANEL
Albumin: 2.3 g/dL — ABNORMAL LOW (ref 3.5–5.0)
Anion gap: 21 — ABNORMAL HIGH (ref 5–15)
BUN: 44 mg/dL — ABNORMAL HIGH (ref 6–20)
CO2: 21 mmol/L — ABNORMAL LOW (ref 22–32)
Calcium: 8.6 mg/dL — ABNORMAL LOW (ref 8.9–10.3)
Chloride: 93 mmol/L — ABNORMAL LOW (ref 98–111)
Creatinine, Ser: 7.24 mg/dL — ABNORMAL HIGH (ref 0.61–1.24)
GFR, Estimated: 8 mL/min — ABNORMAL LOW (ref >60)
Glucose, Bld: 177 mg/dL — ABNORMAL HIGH (ref 70–99)
Phosphorus: 5.7 mg/dL — ABNORMAL HIGH (ref 2.5–4.6)
Potassium: 4.2 mmol/L (ref 3.5–5.1)
Sodium: 135 mmol/L (ref 135–145)

## 2023-10-15 LAB — GLUCOSE, CAPILLARY
Glucose-Capillary: 206 mg/dL — ABNORMAL HIGH (ref 70–99)
Glucose-Capillary: 179 mg/dL — ABNORMAL HIGH (ref 70–99)
Glucose-Capillary: 185 mg/dL — ABNORMAL HIGH (ref 70–99)
Glucose-Capillary: 255 mg/dL — ABNORMAL HIGH (ref 70–99)

## 2023-10-15 LAB — ECHO TEE

## 2023-10-15 SURGERY — TRANSESOPHAGEAL ECHOCARDIOGRAM (TEE) (CATHLAB)
Anesthesia: Monitor Anesthesia Care

## 2023-10-15 MED ORDER — GABAPENTIN 100 MG PO CAPS
100.0000 mg | ORAL_CAPSULE | Freq: Every day | ORAL | Status: DC
  Administered 2023-10-15 – 2023-10-17 (×3): 100 mg via ORAL
  Filled 2023-10-15 (×3): qty 1

## 2023-10-15 MED ORDER — ALTEPLASE 2 MG IJ SOLR: 2.0000 mg | Freq: Once | INTRAMUSCULAR | Status: DC | PRN

## 2023-10-15 MED ORDER — CHLORHEXIDINE GLUCONATE CLOTH 2 % EX PADS
6.0000 | MEDICATED_PAD | Freq: Every day | CUTANEOUS | Status: DC
  Administered 2023-10-16 – 2023-10-18 (×2): 6 via TOPICAL

## 2023-10-15 MED ORDER — HEPARIN SODIUM (PORCINE) 1000 UNIT/ML DIALYSIS
1000.0000 [IU] | INTRAMUSCULAR | Status: DC | PRN
  Administered 2023-10-16: 3200 [IU]

## 2023-10-15 MED ORDER — PHENYLEPHRINE HCL (PRESSORS) 10 MG/ML IV SOLN
INTRAVENOUS | Status: DC | PRN
  Administered 2023-10-15: 80 ug via INTRAVENOUS

## 2023-10-15 MED ORDER — ANTICOAGULANT SODIUM CITRATE 4% (200MG/5ML) IV SOLN
5.0000 mL | Status: DC | PRN
Start: 2023-10-15 — End: 2023-10-16

## 2023-10-15 MED ORDER — PENTAFLUOROPROP-TETRAFLUOROETH EX AERO
1.0000 | INHALATION_SPRAY | CUTANEOUS | Status: DC | PRN
Start: 2023-10-15 — End: 2023-10-16

## 2023-10-15 MED ORDER — VANCOMYCIN HCL 500 MG/100ML IV SOLN
500.0000 mg | INTRAVENOUS | Status: DC
  Administered 2023-10-16: 500 mg via INTRAVENOUS
  Filled 2023-10-15 (×2): qty 100

## 2023-10-15 MED ORDER — LIDOCAINE 2% (20 MG/ML) 5 ML SYRINGE
INTRAMUSCULAR | Status: DC | PRN
  Administered 2023-10-15: 50 mg via INTRAVENOUS

## 2023-10-15 MED ORDER — NEPRO/CARBSTEADY PO LIQD: 237.0000 mL | ORAL | Status: DC | PRN

## 2023-10-15 MED ORDER — LIDOCAINE-PRILOCAINE 2.5-2.5 % EX CREA
1.0000 | TOPICAL_CREAM | CUTANEOUS | Status: DC | PRN
Start: 2023-10-15 — End: 2023-10-16

## 2023-10-15 MED ORDER — PROPOFOL 10 MG/ML IV BOLUS
INTRAVENOUS | Status: DC | PRN
  Administered 2023-10-15: 30 mg via INTRAVENOUS
  Administered 2023-10-15: 40 mg via INTRAVENOUS
  Administered 2023-10-15: 10 mg via INTRAVENOUS
  Administered 2023-10-15: 20 mg via INTRAVENOUS

## 2023-10-15 MED ORDER — GADOBUTROL 1 MMOL/ML IV SOLN
4.0000 mL | Freq: Once | INTRAVENOUS | Status: AC | PRN
  Administered 2023-10-15: 4 mL via INTRAVENOUS

## 2023-10-15 MED ORDER — SODIUM CHLORIDE 0.9 % IV SOLN: INTRAVENOUS | Status: DC | PRN

## 2023-10-15 MED ORDER — LIDOCAINE HCL (PF) 1 % IJ SOLN: 5.0000 mL | INTRAMUSCULAR | Status: DC | PRN

## 2023-10-15 NOTE — Progress Notes (Signed)
  Echocardiogram Echocardiogram Transesophageal has been performed.  Alvin Daniels 10/15/2023, 1:48 PM

## 2023-10-15 NOTE — Progress Notes (Signed)
 PROGRESS NOTE    Alvin Daniels  ZOX:[id number] DOB: [birth year] DOA: 10/08/2023 PCP: [name], MD    Brief Narrative:   Alvin Daniels is a 53 y.o. Spanish-speaking male with past medical history significant for ESRD on HD MWF, type 2 diabetes mellitus poorly controlled, HIV, chronic hepatitis C, PAD s/p bilateral BKA who presented to [facility] ED on 10/08/2023 with complaints of nausea/vomiting, abdominal pain, fever.  Additionally complaining of low back, hip pain, swelling to his left fourth finger in which she is developed a wound over the last 4 weeks.  In the ED, temperature 98.1 F,  RR 14, HR 81, BP 96/56, SpO2 97% on room air.  Initial labs significant for WBC 12.2, Hgb 10.6, platelet 87, sodium 136, K+ 3.1, stable kidney function, albumin  2.7, AST/ALT 52/34, alk phos 261, bilirubin 1.4, normal lipase, INR and lactic acid, mag 2.1, troponin 53.  EKG shows sinus rhythm. Left hand x-ray shows soft tissue swelling with bony cortical destruction involving the fourth mid and distal phalanx consistent with osteomyelitis. CT A/P without contrast shows mild CBD dilation and hepatomegaly. RUQ U/S shows gallbladder sludge with mild gallbladder wall thickening but no evidence of acute cholecystitis and minimally increased common bile duct.  Sepsis protocol was activated and patient received IV NS 2 L x 2, IV vancomycin  and IV Zosyn . Hand surgery was consulted.  TRH consulted for admission for further evaluation and management of sepsis secondary to osteomyelitis left fourth finger.  Assessment & Plan:   Severe sepsis, POA Patient presenting with worsening pain/edema to left fourth finger with wound.  Left hand x-ray with soft tissue swelling, bony cortical destruction fourth mid/distal phalanx consistent with osteomyelitis.  Also positive blood cultures with MRSA. -- Continue treatment as below  Osteomyelitis left fourth finger Patient with 1 month history of  worsening left fourth finger pain/edema with development of wound.  Left hand x-ray with soft tissue swelling with bony cortical destruction involving the fourth mid/distal phalanx consistent with osteomyelitis.  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 [name]. -- Throat culture with E. coli, Streptococcus anginosus -- Continues on antibiotics with vancomycin  per infectious disease -- Okay to start washing hand with warm, soapy water on 10/16/2023, no plans for additional surgery -- Outpatient follow-up with orthopedic hand surgery  MRSA septicemia Concern for right posterior atrial wall vegetation/mass Suspect etiology likely secondary to HD catheter.  TDC removed for line holiday on 6/1, replaced on 6/3.  TTE with LVEF 60 to 65%, no LV RWMA, RV systolic function normal, catheter right atrium with semimobile densities concerning for possible vegetation, IVC normal in size.  TEE 6/4 with findings of a partial Lee mobile mass posterior right atrial wall, may be vegetation/infection from wire old HD catheter transiting impinged RA wall, new HD catheter with no vegetation and does not impinge on the vegetation, cannot rule out tumor. -- Infectious disease following, appreciate assistance -- Blood cultures x 2: 5/28: + MRSA -- Blood cultures x 2: 6/2: now growth x 2 days -- Vancomycin , pharmacy consulted for dosing/monitoring --Contact precautions -- Will need repeat TEE after trial of antibiotics to ensure resolution per cardiology  Chronic hypotension: -- Midodrine  10 mg p.o. 3 times daily   Elevated alk phos Gallbladder sludge: ALP 261, mildly elevated AST to 62 and bilirubin of 1.4. RUQ U/S shows some gallbladder sludge but no acute cholecystitis, CBD minimally increased from 2021. Denies RUQ pain.      ESRD on  HD MWF Anemia of chronic disease Mild hypokalemia Metabolic bone disease: TDC removed on 6/1, replaced on 6/3. --  Nephrology following continued HD while inpatient, appreciate assistance    Type 2 diabetes with hyperglycemia: Hemoglobin A1c 10.0% on 07/17/2023.  PTA on NovoLog  70/30 at 15 units bid -- NovoLog  70/30 5 units [place] BID -- Sensitive SSI for coverage -- CBGs qAC/HS  HIV: Last CD4 count 315, viral load 27 on 04/16/23 now < 20, CD4 254 from 315.  -- Continue Biktarvy  per ID   Thrombocytopenia, chronic: -- Plt 87>>53>91>110, stable   Hx Bilateral BKA: -- Cont b/l prosthetics    DVT prophylaxis: heparin  injection 5,000 Units Start: 10/09/23 0030    Code Status: Full Code Family Communication: No family present bedside this morning  Disposition Plan:  Level of care: Telemetry Medical Status is: Inpatient Remains inpatient appropriate because: IV antibiotics    Consultants:  Infectious disease Interventional radiology Nephrology  Procedures:  [facility] removal 6/1 TDC replacement 6/3  Antimicrobials:  Vancomycin  5/28>> Zosyn  5/28 - 5/28  ceftriaxone  5/29 - 5/30  cefazolin  6/3 - 6/3   Subjective: Patient seen examined bedside, lying in bed.  Aided in interpretation with video interpreter Desiderio Floras [phone].  Patient complaining of right-sided lateral hip pain, not sickly change since admission.  MRI right hip without contrast unrevealing.  Remains on IV vancomycin .  Plan TEE today.  No other Spenser questions, concerns or complaints at this time.  Denies headache, no dizziness, no chest pain, no palpitations, no shortness of breath, no abdominal pain, no fever/chills/night sweats, no nausea cefonicid diarrhea.  No acute events overnight per nurse.  Objective: Vitals:   10/15/23 1313 10/15/23 1320 10/15/23 1330 10/15/23 1420  BP: (!) 85/56 (!) 88/60 (!) 89/53 114/76  Pulse: 75 77 76 83  Resp: 20 11 14    Temp:    98.2 F (36.8 C)  TempSrc:      SpO2:    99%  Weight:      Height:        Intake/Output Summary (Last 24 hours) at 10/15/2023 1457 Last data filed at 10/15/2023 1307 Gross  per 24 hour  Intake 143.46 ml  Output 1000 ml  Net -856.54 ml   Filed Weights   10/14/23 1346 10/14/23 1351 10/14/23 1735  Weight: 47.2 kg 47.2 kg 47 kg    Examination:  Physical Exam: GEN: NAD, alert and oriented x 3, chronically ill appearance, appears older than stated age HEENT: NCAT, PERRL, EOMI, sclera clear, poor dentition PULM: CTAB w/o wheezes/crackles, normal respiratory effort, room air CV: RRR w/o M/G/R, TDC noted to right chest GI: abd soft, NTND, + BS MSK: Noted bilateral BKA, moves all extremities independently, left hand with dressing in place, noted left fourth finger amputation, dressing clean/dry/intact NEURO: No focal neurological deficit PSYCH: normal mood/affect Integumentary: Bilateral BKA and left hand as above, otherwise no other concerning rashes/lesions/wounds noted exposed skin surfaces    Data Reviewed: I have personally reviewed following labs and imaging studies  CBC: Recent Labs  Lab 10/08/23 1634 10/09/23 0631 10/10/23 1218 10/11/23 0627 10/12/23 0608 10/13/23 0646 10/14/23 0822  WBC 12.2*   < > 14.2* 12.6* 11.9* 13.8* 14.3*  NEUTROABS 10.7*  --   --   --   --   --   --   HGB 10.6*   < > 9.7* 10.0* 11.1* 11.1* 11.0*  HCT 31.2*   < > 28.7* 31.7* 32.1* 32.7* 32.9*  MCV 97.2   < > 99.3 103.9*  96.4 96.7 95.6  PLT 87*   < > 78* 75* 83* 91* 110*   < > = values in this interval not displayed.   Basic Metabolic Panel: Recent Labs  Lab 10/08/23 1947 10/09/23 0631 10/10/23 0454 10/10/23 1218 10/11/23 0981 10/12/23 1914 10/13/23 0646 10/14/23 0822  NA  --  134*   < > 135 132* 128* 129* 129*  K  --  3.8   < > 3.7 4.4 3.9 4.5 5.1  CL  --  101   < > 95* 95* 94* 93* 92*  CO2  --  21*   < > 21* 20* 22 17* 15*  GLUCOSE  --  149*   < > 131* 200* 226* 198* 202*  BUN  --  33*   < > 15 35* 58* 79* 98*  CREATININE  --  7.78*   < > 3.77* 6.43* 8.00* 9.92* 11.91*  CALCIUM   --  8.5*   < > 8.8* 9.0 9.1 9.1 9.2  MG 2.1  --   --   --   --   --   --    --   PHOS  --  4.5  --  2.1*  --   --   --   --    < > = values in this interval not displayed.   GFR: Estimated Creatinine Clearance: 4.8 mL/min (A) (by C-G formula based on SCr of 11.91 mg/dL (H)). Liver Function Tests: Recent Labs  Lab 10/10/23 7829 10/10/23 1218 10/11/23 0627 10/12/23 0608 10/13/23 0646 10/14/23 0822  AST 31  --  31 33 27 23  ALT 22  --  20 21 18 16   ALKPHOS 315*  --  411* 488* 432* 409*  BILITOT 1.3*  --  1.5* 1.4* 1.2 1.4*  PROT 6.7  --  6.7 6.9 6.8 7.3  ALBUMIN  2.3* 2.6* 2.2* 2.2* 2.1* 2.2*   Recent Labs  Lab 10/08/23 1634  LIPASE 32   No results for input(s): "AMMONIA" in the last 168 hours. Coagulation Profile: Recent Labs  Lab 10/08/23 1947  INR 1.2   Cardiac Enzymes: No results for input(s): "CKTOTAL", "CKMB", "CKMBINDEX", "TROPONINI" in the last 168 hours. BNP (last 3 results) No results for input(s): "PROBNP" in the last 8760 hours. HbA1C: No results for input(s): "HGBA1C" in the last 72 hours. CBG: Recent Labs  Lab 10/14/23 1210 10/14/23 1821 10/14/23 2210 10/15/23 0630 10/15/23 1141  GLUCAP 188* 95 228* 206* 179*   Lipid Profile: No results for input(s): "CHOL", "HDL", "LDLCALC", "TRIG", "CHOLHDL", "LDLDIRECT" in the last 72 hours. Thyroid Function Tests: No results for input(s): "TSH", "T4TOTAL", "FREET4", "T3FREE", "THYROIDAB" in the last 72 hours. Anemia Panel: No results for input(s): "VITAMINB12", "FOLATE", "FERRITIN", "TIBC", "IRON ", "RETICCTPCT" in the last 72 hours. Sepsis Labs: Recent Labs  Lab 10/08/23 1818 10/08/23 2001  LATICACIDVEN 1.3 0.9    Recent Results (from the past 240 hours)  Blood Cultures x 2 sites     Status: Abnormal   Collection Time: 10/08/23  7:38 PM   Specimen: BLOOD RIGHT HAND  Result Value Ref Range Status   Specimen Description BLOOD RIGHT HAND  Final   Special Requests   Final    BOTTLES DRAWN AEROBIC AND ANAEROBIC Blood Culture adequate volume   Culture  Setup Time   Final     GRAM POSITIVE COCCI IN BOTH AEROBIC AND ANAEROBIC BOTTLES CRITICAL VALUE NOTED.  VALUE IS CONSISTENT WITH PREVIOUSLY REPORTED AND CALLED VALUE.    Culture (A)  Final    STAPHYLOCOCCUS AUREUS SUSCEPTIBILITIES PERFORMED ON PREVIOUS CULTURE WITHIN THE LAST 5 DAYS. Performed at [facility], [address] [place], [place] [address]    Report Status 10/11/2023 FINAL  Final  Blood Cultures x 2 sites     Status: Abnormal   Collection Time: 10/08/23  7:43 PM   Specimen: BLOOD RIGHT FOREARM  Result Value Ref Range Status   Specimen Description BLOOD RIGHT FOREARM  Final   Special Requests   Final    BOTTLES DRAWN AEROBIC AND ANAEROBIC Blood Culture adequate volume   Culture  Setup Time   Final    GRAM POSITIVE COCCI IN CLUSTERS IN BOTH AEROBIC AND ANAEROBIC BOTTLES CRITICAL RESULT CALLED TO, READ BACK BY AND VERIFIED WITH: Carmie Chough on [id number] @0900  by SM Performed at [facility], [address] [place], [place] [address]    Culture METHICILLIN RESISTANT STAPHYLOCOCCUS AUREUS (A)  Final   Report Status 10/11/2023 FINAL  Final   Organism ID, Bacteria METHICILLIN RESISTANT STAPHYLOCOCCUS AUREUS  Final      Susceptibility   Methicillin resistant staphylococcus aureus - MIC*    CIPROFLOXACIN  <=0.5 SENSITIVE Sensitive     ERYTHROMYCIN  >=8 RESISTANT Resistant     GENTAMICIN <=0.5 SENSITIVE Sensitive     OXACILLIN >=4 RESISTANT Resistant     TETRACYCLINE >=16 RESISTANT Resistant     VANCOMYCIN  1 SENSITIVE Sensitive     TRIMETH /SULFA  <=10 SENSITIVE Sensitive     CLINDAMYCIN <=0.25 SENSITIVE Sensitive     RIFAMPIN <=0.5 SENSITIVE Sensitive     Inducible Clindamycin NEGATIVE Sensitive     LINEZOLID  2 SENSITIVE Sensitive     * METHICILLIN RESISTANT STAPHYLOCOCCUS AUREUS  Blood Culture ID Panel (Reflexed)     Status: Abnormal   Collection Time: 10/08/23  7:43 PM  Result Value Ref Range Status   Enterococcus faecalis NOT DETECTED NOT DETECTED Final   Enterococcus Faecium NOT  DETECTED NOT DETECTED Final   Listeria monocytogenes NOT DETECTED NOT DETECTED Final   Staphylococcus species DETECTED (A) NOT DETECTED Final    Comment: CRITICAL RESULT CALLED TO, READ BACK BY AND VERIFIED WITH: Carmie Chough on [id number] @0900  by SM    Staphylococcus aureus (BCID) DETECTED (A) NOT DETECTED Final    Comment: Methicillin (oxacillin)-resistant Staphylococcus aureus (MRSA). MRSA is predictably resistant to beta-lactam antibiotics (except ceftaroline). Preferred therapy is vancomycin  unless clinically contraindicated. Patient requires contact precautions if  hospitalized. CRITICAL RESULT CALLED TO, READ BACK BY AND VERIFIED WITH: Carmie Chough on [phone] @0900  by SM    Staphylococcus epidermidis NOT DETECTED NOT DETECTED Final   Staphylococcus lugdunensis NOT DETECTED NOT DETECTED Final   Streptococcus species NOT DETECTED NOT DETECTED Final   Streptococcus agalactiae NOT DETECTED NOT DETECTED Final   Streptococcus pneumoniae NOT DETECTED NOT DETECTED Final   Streptococcus pyogenes NOT DETECTED NOT DETECTED Final   A.calcoaceticus-baumannii NOT DETECTED NOT DETECTED Final   Bacteroides fragilis NOT DETECTED NOT DETECTED Final   Enterobacterales NOT DETECTED NOT DETECTED Final   Enterobacter cloacae complex NOT DETECTED NOT DETECTED Final   Escherichia coli NOT DETECTED NOT DETECTED Final   Klebsiella aerogenes NOT DETECTED NOT DETECTED Final   Klebsiella oxytoca NOT DETECTED NOT DETECTED Final   Klebsiella pneumoniae NOT DETECTED NOT DETECTED Final   Proteus species NOT DETECTED NOT DETECTED Final   Salmonella species NOT DETECTED NOT DETECTED Final   Serratia marcescens NOT DETECTED NOT DETECTED Final   Haemophilus influenzae NOT DETECTED NOT DETECTED Final   Neisseria  meningitidis NOT DETECTED NOT DETECTED Final   Pseudomonas aeruginosa NOT DETECTED NOT DETECTED Final   Stenotrophomonas maltophilia NOT DETECTED NOT DETECTED Final   Candida albicans NOT DETECTED NOT DETECTED  Final   Candida auris NOT DETECTED NOT DETECTED Final   Candida glabrata NOT DETECTED NOT DETECTED Final   Candida krusei NOT DETECTED NOT DETECTED Final   Candida parapsilosis NOT DETECTED NOT DETECTED Final   Candida tropicalis NOT DETECTED NOT DETECTED Final   Cryptococcus neoformans/gattii NOT DETECTED NOT DETECTED Final   Meth resistant mecA/C and MREJ DETECTED (A) NOT DETECTED Final    Comment: CRITICAL RESULT CALLED TO, READ BACK BY AND VERIFIED WITH: Carmie Chough on [id number] @0900  by SM Performed at [facility], [address], [place], [place] [address]   Surgical pcr screen     Status: Abnormal   Collection Time: 10/09/23  2:22 AM   Specimen: Nasal Mucosa; Nasal Swab  Result Value Ref Range Status   MRSA, PCR POSITIVE (A) NEGATIVE Final    Comment: RESULT CALLED TO, READ BACK BY AND VERIFIED WITH: P BROWN RN 10/09/2023 @ 0526 BY AB    Staphylococcus aureus POSITIVE (A) NEGATIVE Final    Comment: (NOTE) The Xpert SA Assay (FDA approved for NASAL specimens in patients 36 years of age and older), is one component of a comprehensive surveillance program. It is not intended to diagnose infection nor to guide or monitor treatment. Performed at [facility], [address], [place], [place] [address]   Aerobic/Anaerobic Culture w Gram Stain (surgical/deep wound)     Status: None   Collection Time: 10/09/23  1:29 PM   Specimen: Finger, Left; Tissue  Result Value Ref Range Status   Specimen Description TISSUE  Final   Special Requests LEFT FINGER ABSCESS  Final   Gram Stain   Final    NO WBC SEEN ABUNDANT GRAM POSITIVE COCCI ABUNDANT GRAM NEGATIVE RODS    Culture   Final    RARE ESCHERICHIA COLI RARE STREPTOCOCCUS ANGINOSIS FEW BACTEROIDES FRAGILIS BETA LACTAMASE POSITIVE Performed at [facility], [address], [place], [place] [address]    Report Status 10/13/2023 FINAL  Final   Organism ID, Bacteria ESCHERICHIA COLI  Final   Organism ID, Bacteria  STREPTOCOCCUS ANGINOSIS  Final      Susceptibility   Escherichia coli - MIC*    AMPICILLIN  >=32 RESISTANT Resistant     CEFEPIME  <=0.12 SENSITIVE Sensitive     CEFTAZIDIME <=1 SENSITIVE Sensitive     CEFTRIAXONE  <=0.25 SENSITIVE Sensitive     CIPROFLOXACIN  <=0.25 SENSITIVE Sensitive     GENTAMICIN 8 INTERMEDIATE Intermediate     IMIPENEM <=0.25 SENSITIVE Sensitive     TRIMETH /SULFA  >=320 RESISTANT Resistant     AMPICILLIN /SULBACTAM >=32 RESISTANT Resistant     PIP/TAZO >=128 RESISTANT Resistant ug/mL    * RARE ESCHERICHIA COLI   Streptococcus anginosis - MIC*    PENICILLIN <=0.06 SENSITIVE Sensitive     CEFTRIAXONE  0.25 SENSITIVE Sensitive     ERYTHROMYCIN  <=0.12 SENSITIVE Sensitive     LEVOFLOXACIN  <=0.25 SENSITIVE Sensitive     VANCOMYCIN  0.5 SENSITIVE Sensitive     * RARE STREPTOCOCCUS ANGINOSIS  Culture, blood (Routine X 2) w Reflex to ID Panel     Status: None (Preliminary result)   Collection Time: 10/13/23  1:51 PM   Specimen: BLOOD RIGHT HAND  Result Value Ref Range Status   Specimen Description BLOOD RIGHT HAND  Final   Special Requests  Final    BOTTLES DRAWN AEROBIC AND ANAEROBIC Blood Culture results may not be optimal due to an inadequate volume of blood received in culture bottles   Culture   Final    NO GROWTH 2 DAYS Performed at [facility], [address], [place], [place] [address]    Report Status PENDING  Incomplete  Culture, blood (Routine X 2) w Reflex to ID Panel     Status: None (Preliminary result)   Collection Time: 10/13/23  1:52 PM   Specimen: BLOOD RIGHT HAND  Result Value Ref Range Status   Specimen Description BLOOD RIGHT HAND  Final   Special Requests   Final    BOTTLES DRAWN AEROBIC AND ANAEROBIC Blood Culture adequate volume   Culture   Final    NO GROWTH 2 DAYS Performed at [facility], [address] [place], [place] [address]    Report Status PENDING  Incomplete         Radiology Studies: EP STUDY Result Date:  10/15/2023 See surgical note for result.  IR Fluoro Guide CV Line Right Result Date: 10/14/2023 INDICATION: 53 year old male with history of end-stage renal disease on hemodialysis require replacement of tunneled hemodialysis catheter after line holiday for bacteremia. EXAM: TUNNELED CENTRAL VENOUS HEMODIALYSIS CATHETER PLACEMENT WITH ULTRASOUND AND FLUOROSCOPIC GUIDANCE MEDICATIONS: Ancef  2 gm IV . The antibiotic was given in an appropriate time interval prior to skin puncture. ANESTHESIA/SEDATION: Moderate (conscious) sedation was employed during this procedure. A total of Versed  1 mg and Fentanyl  50 mcg was administered intravenously. Moderate Sedation Time: 12 minutes. The patient's level of consciousness and vital signs were monitored continuously by radiology nursing throughout the procedure under my direct supervision. FLUOROSCOPY TIME:  Four mGy, reference air kerma COMPLICATIONS: None immediate. PROCEDURE: Informed written consent was obtained from the patient after a discussion of the risks, benefits, and alternatives to treatment. Questions regarding the procedure were encouraged and answered. The right neck and chest were prepped with chlorhexidine  in a sterile fashion, and a sterile drape was applied covering the operative field. Maximum barrier sterile technique with sterile gowns and gloves were used for the procedure. A timeout was performed prior to the initiation of the procedure. After creating a small venotomy incision, a 21 gauge micropuncture kit was utilized to access the internal jugular vein. Real-time ultrasound guidance was utilized for vascular access including the acquisition of a permanent ultrasound image documenting patency of the accessed vessel. A Rosen wire was advanced to the level of the IVC and the micropuncture sheath was exchanged for an 8 Fr dilator. A 14.5 French tunneled hemodialysis catheter measuring 19 cm from tip to cuff was tunneled in a retrograde fashion from the  anterior chest wall to the venotomy incision. Serial dilation was then performed an a peel-away sheath was placed. The catheter was then placed through the peel-away sheath with the catheter tip ultimately positioned within the right atrium. Final catheter positioning was confirmed and documented with a spot radiographic image. The catheter aspirates and flushes normally. The catheter was flushed with appropriate volume heparin  dwells. The catheter exit site was secured with a 0-Silk retention suture. The venotomy incision was closed with Dermabond. Sterile dressings were applied. The patient tolerated the procedure well without immediate post procedural complication. IMPRESSION: Successful placement of 19 cm tip to cuff tunneled hemodialysis catheter via the right internal jugular vein with catheter tip terminating within the right atrium. The catheter is ready for immediate use. Creasie Doctor, MD [facility] Radiology Electronically Signed   By: Creasie Doctor M.D.   On: 10/14/2023 14:52   IR US  Guide Vasc Access Right Result Date: 10/14/2023 INDICATION: 53 year old male with history of end-stage renal disease on hemodialysis require replacement of tunneled hemodialysis catheter after line holiday for bacteremia. EXAM: TUNNELED CENTRAL VENOUS HEMODIALYSIS CATHETER PLACEMENT WITH ULTRASOUND AND FLUOROSCOPIC GUIDANCE MEDICATIONS: Ancef  2 gm IV . The antibiotic was given in an appropriate time interval prior to skin puncture. ANESTHESIA/SEDATION: Moderate (conscious) sedation was employed during this procedure. A total of Versed  1 mg and Fentanyl  50 mcg was administered intravenously. Moderate Sedation Time: 12 minutes. The patient's level of consciousness and vital signs were monitored continuously by radiology nursing throughout the procedure under my direct supervision. FLUOROSCOPY TIME:  Four mGy, reference air kerma COMPLICATIONS: None immediate. PROCEDURE:  Informed written consent was obtained from the patient after a discussion of the risks, benefits, and alternatives to treatment. Questions regarding the procedure were encouraged and answered. The right neck and chest were prepped with chlorhexidine  in a sterile fashion, and a sterile drape was applied covering the operative field. Maximum barrier sterile technique with sterile gowns and gloves were used for the procedure. A timeout was performed prior to the initiation of the procedure. After creating a small venotomy incision, a 21 gauge micropuncture kit was utilized to access the internal jugular vein. Real-time ultrasound guidance was utilized for vascular access including the acquisition of a permanent ultrasound image documenting patency of the accessed vessel. A Rosen wire was advanced to the level of the IVC and the micropuncture sheath was exchanged for an 8 Fr dilator. A 14.5 French tunneled hemodialysis catheter measuring 19 cm from tip to cuff was tunneled in a retrograde fashion from the anterior chest wall to the venotomy incision. Serial dilation was then performed an a peel-away sheath was placed. The catheter was then placed through the peel-away sheath with the catheter tip ultimately positioned within the right atrium. Final catheter positioning was confirmed and documented with a spot radiographic image. The catheter aspirates and flushes normally. The catheter was flushed with appropriate volume heparin  dwells. The catheter exit site was secured with a 0-Silk retention suture. The venotomy incision was closed with Dermabond. Sterile dressings were applied. The patient tolerated the procedure well without immediate post procedural complication. IMPRESSION: Successful placement of 19 cm tip to cuff tunneled hemodialysis catheter via the right internal jugular vein with catheter tip terminating within the right atrium. The catheter is ready for immediate use. Creasie Doctor, MD [facility] Radiology Electronically Signed   By: Creasie Doctor M.D.   On: 10/14/2023 14:52   MR HIP RIGHT WO CONTRAST Result Date: 10/14/2023 CLINICAL DATA:  Soft tissue infection suspected, right hip. MRSA bacteremia. EXAM: MR OF THE RIGHT HIP WITHOUT CONTRAST TECHNIQUE: Multiplanar, multisequence MR imaging was performed. No intravenous contrast was administered. COMPARISON:  Lumbar MRI 10/10/2023, abdominopelvic CT 10/08/2023 and radiographs of the right hip 12/01/2019. FINDINGS: Technical note: Despite efforts by the technologist and patient, mild motion artifact is present on today's exam and could not be eliminated. This reduces exam sensitivity and specificity. Bones: There is no evidence of acute fracture, dislocation or femoral head osteonecrosis. No evidence of osteomyelitis. There is an intramedullary nail within the left femoral diaphysis. The visualized sacroiliac joints and symphysis pubis appear normal. Articular cartilage and labrum Articular cartilage: No focal chondral defect or subchondral signal abnormality identified. Labrum: There is no gross  labral tear or paralabral abnormality. Joint or bursal effusion Joint effusion: No significant hip joint effusion. Bursae: No focal periarticular fluid collection. Muscles and tendons Muscles and tendons: Generalized soft tissue edema throughout the pelvis with mild involvement of the gluteus musculature bilaterally. No focal fluid collections are identified. There is chronic atrophy of the right piriformis muscle. The gluteus, common hamstring and iliopsoas tendons are intact. Other findings Miscellaneous: Generalized subcutaneous edema throughout the pelvis without focal fluid collection. There is also generalized soft tissue edema throughout the pelvis with a small amount of pelvic ascites. The distal colon is fluid-filled with mild wall thickening. IMPRESSION: 1. Nonspecific generalized soft tissue edema  throughout the pelvis with mild involvement of the gluteus musculature bilaterally. No focal fluid collections are identified. 2. No evidence of osteomyelitis or septic arthritis. 3. No acute osseous findings. 4. Fluid-filled distal colon with mild wall thickening, possibly reflecting colitis. Electronically Signed   By: Elmon Hagedorn M.D.   On: 10/14/2023 10:21        Scheduled Meds:  bictegravir-emtricitabine -tenofovir  AF  1 tablet Oral Daily   Chlorhexidine  Gluconate Cloth  6 each Topical Q0600   Chlorhexidine  Gluconate Cloth  6 each Topical Q0600   gabapentin   100 mg Oral QHS   heparin   5,000 Units Subcutaneous Q8H   insulin  aspart  0-5 Units Subcutaneous QHS   insulin  aspart  0-9 Units Subcutaneous TID WC   insulin  aspart protamine - aspart  5 Units Subcutaneous BID WC   lidocaine   1 patch Transdermal Q24H   lidocaine  (PF)  10 mL Infiltration Once   midodrine   10 mg Oral TID   vancomycin  variable dose per unstable renal function (pharmacist dosing)   Does not apply See admin instructions   Continuous Infusions:  anticoagulant sodium citrate     [START ON 10/16/2023] vancomycin        LOS: 7 days    Time spent: 50 minutes spent on 10/15/2023 caring for this patient face-to-face including chart review, ordering labs/tests, documenting, discussion with nursing staff, consultants, updating family and interview/physical exam    Rema Care [place], DO [facility] Available via Epic secure chat 7am-7pm After these hours, please refer to coverage provider listed on [email or web address] 10/15/2023, 2:57 PM

## 2023-10-15 NOTE — Anesthesia Preprocedure Evaluation (Signed)
 Anesthesia Evaluation  Patient identified by MRN, date of birth, ID band Patient awake    Reviewed: Allergy & Precautions, NPO status , Patient's Chart, lab work & pertinent test results  History of Anesthesia Complications Negative for: history of anesthetic complications  Airway Mallampati: III  TM Distance: >3 FB Neck ROM: Full    Dental  (+) Dental Advisory Given, Teeth Intact   Pulmonary shortness of breath, neg COPD   breath sounds clear to auscultation       Cardiovascular + Peripheral Vascular Disease   Rhythm:Regular     Neuro/Psych  Headaches  Neuromuscular disease  negative psych ROS   GI/Hepatic ,GERD  ,,(+) Hepatitis -  Endo/Other  diabetes, Type 2, Insulin  Dependent  Lab Results      Component                Value               Date                      HGBA1C                   10.0 (A)            07/17/2023             Renal/GU ESRF and DialysisRenal diseaseLab Results      Component                Value               Date                      NA                       129 (L)             10/14/2023                K                        5.1                 10/14/2023                CO2                      15 (L)              10/14/2023                GLUCOSE                  202 (H)             10/14/2023                BUN                      98 (H)              10/14/2023                CREATININE               11.91 (H)           10/14/2023  CALCIUM                   9.2                 10/14/2023                GFR                      10.90 (LL)          07/10/2011                EGFR                     14 (L)              06/06/2021                GFRNONAA                 5 (L)               10/14/2023                Musculoskeletal  (+) Arthritis ,    Abdominal   Peds  Hematology  (+) Blood dyscrasia, anemia , HIVLab Results      Component                Value                Date                      WBC                      14.3 (H)            10/14/2023                HGB                      11.0 (L)            10/14/2023                HCT                      32.9 (L)            10/14/2023                MCV                      95.6                10/14/2023                PLT                      110 (L)             10/14/2023              Anesthesia Other Findings   Reproductive/Obstetrics                             Anesthesia Physical Anesthesia Plan  ASA: 4  Anesthesia Plan: MAC   Post-op Pain Management: Minimal or no pain anticipated   Induction: Intravenous  PONV  Risk Score and Plan: 1 and Propofol  infusion and Treatment may vary due to age or medical condition  Airway Management Planned: Nasal Cannula, Natural Airway and Simple Face Mask  Additional Equipment: None  Intra-op Plan:   Post-operative Plan:   Informed Consent: I have reviewed the patients History and Physical, chart, labs and discussed the procedure including the risks, benefits and alternatives for the proposed anesthesia with the patient or authorized representative who has indicated his/her understanding and acceptance.     Dental advisory given and Interpreter used for interview  Plan Discussed with: CRNA  Anesthesia Plan Comments:        Anesthesia Quick Evaluation

## 2023-10-15 NOTE — CV Procedure (Signed)
 Procedure: TEE  Sedation: Per anesthesiology  Findings: Please see echo section for full report.    There is a partially mobile mass on the posterior right atrial wall.  This may be a vegetation/infection from where the old HD catheter transiently impinged on the RA wall (now removed and replaced).  New HD catheter has no vegetation and does not impinge on the vegetation. Cannot rule out tumor, etc.  Think he will need repeat study after trial of antibiotic treatment to ensure resolution.   I will review the prior TTE as well.   Peder Bourdon 10/15/2023 1:07 PM

## 2023-10-15 NOTE — Progress Notes (Signed)
 [facility] Progress Note   Subjective:   Pt reporting pain in his lower back/R hip today. Denies SOB, dizziness. Had new TDC placed and HD yesterday.   Objective Vitals:   10/14/23 1735 10/14/23 2005 10/15/23 0458 10/15/23 0832  BP: 126/81 110/63 99/75 (!) 91/59  Pulse: 83 74 83 88  Resp: 17   18  Temp: 97.6 F (36.4 C) 97.7 F (36.5 C) 98.4 F (36.9 C) 97.8 F (36.6 C)  TempSrc:  Oral Oral Oral  SpO2: 97% 99% 100% 97%  Weight: 47 kg     Height:       Physical Exam General: Alert male in NAD Heart: RRR, no murmurs, rubs or gallops Lungs: CTA bilaterally, respirations unlabored on RA Abdomen: Soft, non-distended, +BS Extremities: B/l BKA, no edema noted Dialysis Access:  TDC bandaged, appears to have small skin tear under edge of bandage but no bleeding/drainage from [facility] exit site  Additional Objective Labs: Basic Metabolic Panel: Recent Labs  Lab 10/09/23 0631 10/10/23 1610 10/10/23 1218 10/11/23 0627 10/12/23 9604 10/13/23 0646 10/14/23 0822  NA 134*   < > 135   < > 128* 129* 129*  K 3.8   < > 3.7   < > 3.9 4.5 5.1  CL 101   < > 95*   < > 94* 93* 92*  CO2 21*   < > 21*   < > 22 17* 15*  GLUCOSE 149*   < > 131*   < > 226* 198* 202*  BUN 33*   < > 15   < > 58* 79* 98*  CREATININE 7.78*   < > 3.77*   < > 8.00* 9.92* 11.91*  CALCIUM  8.5*   < > 8.8*   < > 9.1 9.1 9.2  PHOS 4.5  --  2.1*  --   --   --   --    < > = values in this interval not displayed.   Liver Function Tests: Recent Labs  Lab 10/12/23 0608 10/13/23 0646 10/14/23 0822  AST 33 27 23  ALT 21 18 16   ALKPHOS 488* 432* 409*  BILITOT 1.4* 1.2 1.4*  PROT 6.9 6.8 7.3  ALBUMIN  2.2* 2.1* 2.2*   Recent Labs  Lab 10/08/23 1634  LIPASE 32   CBC: Recent Labs  Lab 10/08/23 1634 10/09/23 0631 10/10/23 1218 10/11/23 0627 10/12/23 0608 10/13/23 0646 10/14/23 0822  WBC 12.2*   < > 14.2* 12.6* 11.9* 13.8* 14.3*  NEUTROABS 10.7*  --   --   --   --   --   --   HGB 10.6*   < >  9.7* 10.0* 11.1* 11.1* 11.0*  HCT 31.2*   < > 28.7* 31.7* 32.1* 32.7* 32.9*  MCV 97.2   < > 99.3 103.9* 96.4 96.7 95.6  PLT 87*   < > 78* 75* 83* 91* 110*   < > = values in this interval not displayed.   Blood Culture    Component Value Date/Time   SDES BLOOD RIGHT HAND 10/13/2023 1352   SPECREQUEST  10/13/2023 1352    BOTTLES DRAWN AEROBIC AND ANAEROBIC Blood Culture adequate volume   CULT  10/13/2023 1352    NO GROWTH < 24 HOURS Performed at [facility], [address], [place], [place] [address]    REPTSTATUS PENDING 10/13/2023 1352    Cardiac Enzymes: No results for input(s): "CKTOTAL", "CKMB", "CKMBINDEX", "TROPONINI" in the last 168 hours. CBG: Recent Labs  Lab 10/14/23 [phone]  10/14/23 1210 10/14/23 1821 10/14/23 2210 10/15/23 0630  GLUCAP 198* 188* 95 228* 206*   Iron  Studies: No results for input(s): "IRON ", "TIBC", "TRANSFERRIN", "FERRITIN" in the last 72 hours. @lablastinr3 @ Studies/Results: IR Fluoro Guide CV Line Right Result Date: 10/14/2023 INDICATION: 53 year old male with history of end-stage renal disease on hemodialysis require replacement of tunneled hemodialysis catheter after line holiday for bacteremia. EXAM: TUNNELED CENTRAL VENOUS HEMODIALYSIS CATHETER PLACEMENT WITH ULTRASOUND AND FLUOROSCOPIC GUIDANCE MEDICATIONS: Ancef  2 gm IV . The antibiotic was given in an appropriate time interval prior to skin puncture. ANESTHESIA/SEDATION: Moderate (conscious) sedation was employed during this procedure. A total of Versed  1 mg and Fentanyl  50 mcg was administered intravenously. Moderate Sedation Time: 12 minutes. The patient's level of consciousness and vital signs were monitored continuously by radiology nursing throughout the procedure under my direct supervision. FLUOROSCOPY TIME:  Four mGy, reference air kerma COMPLICATIONS: None immediate. PROCEDURE: Informed written consent was obtained from the patient after a discussion of the risks, benefits, and  alternatives to treatment. Questions regarding the procedure were encouraged and answered. The right neck and chest were prepped with chlorhexidine  in a sterile fashion, and a sterile drape was applied covering the operative field. Maximum barrier sterile technique with sterile gowns and gloves were used for the procedure. A timeout was performed prior to the initiation of the procedure. After creating a small venotomy incision, a 21 gauge micropuncture kit was utilized to access the internal jugular vein. Real-time ultrasound guidance was utilized for vascular access including the acquisition of a permanent ultrasound image documenting patency of the accessed vessel. A Rosen wire was advanced to the level of the IVC and the micropuncture sheath was exchanged for an 8 Fr dilator. A 14.5 French tunneled hemodialysis catheter measuring 19 cm from tip to cuff was tunneled in a retrograde fashion from the anterior chest wall to the venotomy incision. Serial dilation was then performed an a peel-away sheath was placed. The catheter was then placed through the peel-away sheath with the catheter tip ultimately positioned within the right atrium. Final catheter positioning was confirmed and documented with a spot radiographic image. The catheter aspirates and flushes normally. The catheter was flushed with appropriate volume heparin  dwells. The catheter exit site was secured with a 0-Silk retention suture. The venotomy incision was closed with Dermabond. Sterile dressings were applied. The patient tolerated the procedure well without immediate post procedural complication. IMPRESSION: Successful placement of 19 cm tip to cuff tunneled hemodialysis catheter via the right internal jugular vein with catheter tip terminating within the right atrium. The catheter is ready for immediate use. Creasie Doctor, MD [facility] Rad[facility] Electronically Signed   By: Creasie Doctor M.D.   On:  10/14/2023 14:52   IR US  Guide Vasc Access Right Result Date: 10/14/2023 INDICATION: 53 year old male with history of end-stage renal disease on hemodialysis require replacement of tunneled hemodialysis catheter after line holiday for bacteremia. EXAM: TUNNELED CENTRAL VENOUS HEMODIALYSIS CATHETER PLACEMENT WITH ULTRASOUND AND FLUOROSCOPIC GUIDANCE MEDICATIONS: Ancef  2 gm IV . The antibiotic was given in an appropriate time interval prior to skin puncture. ANESTHESIA/SEDATION: Moderate (conscious) sedation was employed during this procedure. A total of Versed  1 mg and Fentanyl  50 mcg was administered intravenously. Moderate Sedation Time: 12 minutes. The patient's level of consciousness and vital signs were monitored continuously by radiology nursing throughout the procedure under my direct supervision. FLUOROSCOPY TIME:  Four mGy, reference air kerma COMPLICATIONS: None immediate. PROCEDURE: Informed written consent was obtained  from the patient after a discussion of the risks, benefits, and alternatives to treatment. Questions regarding the procedure were encouraged and answered. The right neck and chest were prepped with chlorhexidine  in a sterile fashion, and a sterile drape was applied covering the operative field. Maximum barrier sterile technique with sterile gowns and gloves were used for the procedure. A timeout was performed prior to the initiation of the procedure. After creating a small venotomy incision, a 21 gauge micropuncture kit was utilized to access the internal jugular vein. Real-time ultrasound guidance was utilized for vascular access including the acquisition of a permanent ultrasound image documenting patency of the accessed vessel. A Rosen wire was advanced to the level of the IVC and the micropuncture sheath was exchanged for an 8 Fr dilator. A 14.5 French tunneled hemodialysis catheter measuring 19 cm from tip to cuff was tunneled in a retrograde fashion from the anterior chest wall to  the venotomy incision. Serial dilation was then performed an a peel-away sheath was placed. The catheter was then placed through the peel-away sheath with the catheter tip ultimately positioned within the right atrium. Final catheter positioning was confirmed and documented with a spot radiographic image. The catheter aspirates and flushes normally. The catheter was flushed with appropriate volume heparin  dwells. The catheter exit site was secured with a 0-Silk retention suture. The venotomy incision was closed with Dermabond. Sterile dressings were applied. The patient tolerated the procedure well without immediate post procedural complication. IMPRESSION: Successful placement of 19 cm tip to cuff tunneled hemodialysis catheter via the right internal jugular vein with catheter tip terminating within the right atrium. The catheter is ready for immediate use. Creasie Doctor, MD [facility] Radiology Electronically Signed   By: Creasie Doctor M.D.   On: 10/14/2023 14:52   MR HIP RIGHT WO CONTRAST Result Date: 10/14/2023 CLINICAL DATA:  Soft tissue infection suspected, right hip. MRSA bacteremia. EXAM: MR OF THE RIGHT HIP WITHOUT CONTRAST TECHNIQUE: Multiplanar, multisequence MR imaging was performed. No intravenous contrast was administered. COMPARISON:  Lumbar MRI 10/10/2023, abdominopelvic CT 10/08/2023 and radiographs of the right hip 12/01/2019. FINDINGS: Technical note: Despite efforts by the technologist and patient, mild motion artifact is present on today's exam and could not be eliminated. This reduces exam sensitivity and specificity. Bones: There is no evidence of acute fracture, dislocation or femoral head osteonecrosis. No evidence of osteomyelitis. There is an intramedullary nail within the left femoral diaphysis. The visualized sacroiliac joints and symphysis pubis appear normal. Articular cartilage and labrum Articular cartilage: No focal chondral defect  or subchondral signal abnormality identified. Labrum: There is no gross labral tear or paralabral abnormality. Joint or bursal effusion Joint effusion: No significant hip joint effusion. Bursae: No focal periarticular fluid collection. Muscles and tendons Muscles and tendons: Generalized soft tissue edema throughout the pelvis with mild involvement of the gluteus musculature bilaterally. No focal fluid collections are identified. There is chronic atrophy of the right piriformis muscle. The gluteus, common hamstring and iliopsoas tendons are intact. Other findings Miscellaneous: Generalized subcutaneous edema throughout the pelvis without focal fluid collection. There is also generalized soft tissue edema throughout the pelvis with a small amount of pelvic ascites. The distal colon is fluid-filled with mild wall thickening. IMPRESSION: 1. Nonspecific generalized soft tissue edema throughout the pelvis with mild involvement of the gluteus musculature bilaterally. No focal fluid collections are identified. 2. No evidence of osteomyelitis or septic arthritis. 3. No acute osseous findings. 4. Fluid-filled distal colon with mild  wall thickening, possibly reflecting colitis. Electronically Signed   By: Elmon Hagedorn M.D.   On: 10/14/2023 10:21   Medications:   bictegravir-emtricitabine -tenofovir  AF  1 tablet Oral Daily   Chlorhexidine  Gluconate Cloth  6 each Topical Q0600   heparin   5,000 Units Subcutaneous Q8H   insulin  aspart  0-5 Units Subcutaneous QHS   insulin  aspart  0-9 Units Subcutaneous TID WC   insulin  aspart protamine - aspart  5 Units Subcutaneous BID WC   lidocaine   1 patch Transdermal Q24H   lidocaine  (PF)  10 mL Infiltration Once   midodrine   10 mg Oral TID   sodium chloride  flush  3-10 mL Intravenous Q12H   vancomycin  variable dose per unstable renal function (pharmacist dosing)   Does not apply See admin instructions    Dialysis Orders: [facility] M[facility] 3.5h  B400  45.5kg   TDC   Heparin   2000 Last OP HD 5/28, post wt 45.7kg Very small wt gains, always makes it to dry wt  Assessment/Plan: MRSA bacteremia.  IV Vancomycin  started. Had [facility] in place,  also w finger necrosis. ID consulted. R removed TDC for line holiday 6/1, replaced 6/3. Repeat Bcx negative so far  Finger infection. Osteo on imaging. 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  ESRD: on HD MWF.  Last HD 5/30. Off schedule due to line holiday, last HD Tuesday. Will plan for HD Thursday and resume TTS schedule later this week.  Chronic hypotension: BP's at OP HD as typically low in the 80's-90s or lower during the sessions.  Volume Appears euvolemic, tolerating UF with midodrine  Anemia of esrd: Hgb 10-11, follow.  Secondary hyperparathyroidism: Calcium /Phos acceptable. Continue home meds.  HIV. On Bictarvy.   Ramona Burner, PA-C 10/15/2023, 9:41 AM  [facility] Pager: [phone]

## 2023-10-15 NOTE — Transfer of Care (Signed)
 Immediate Anesthesia Transfer of Care Note  Patient: Alvin Daniels  Procedure(s) Performed: TRANSESOPHAGEAL ECHOCARDIOGRAM  Patient Location: PACU  Anesthesia Type:MAC  Level of Consciousness: drowsy  Airway & Oxygen Therapy: Patient Spontanous Breathing  Post-op Assessment: Report given to RN and Post -op Vital signs reviewed and stable  Post vital signs: Reviewed and stable  Last Vitals:  Vitals Value Taken Time  BP 80/51 10/15/23 1303  Temp 36.7 C 10/15/23 1303  Pulse 79 10/15/23 1303  Resp 7 10/15/23 1303  SpO2 97 % 10/15/23 1303    Last Pain:  Vitals:   10/15/23 1303  TempSrc: Temporal  PainSc: 0-No pain      Patients Stated Pain Goal: 2 (10/12/23 1848)  Complications: No notable events documented.

## 2023-10-15 NOTE — Progress Notes (Signed)
 Pharmacy Antibiotic Note  Alvin Daniels is a 53 y.o. male admitted on 10/08/2023 with MRSA bacteremia and L finger OM. The patient is on HD-MWF with [facility] removed 6/1 after TTE noted possible vegetations on catheter tip. TDC replaced 6/3. Pharmacy has been consulted for Vancomycin  dosing.  Last HD 6/3, gave 500mg  post HD.  Plan: Vanc 500mg  TTS post HD  Follow HD schedule, culture results, LOT, and de-escalation plans   Height: 4\' 11"  (149.9 cm) Weight: 47 kg (103 lb 9.9 oz) IBW/kg (Calculated) : 47.7  Temp (24hrs), Avg:97.8 F (36.6 C), Min:97.5 F (36.4 C), Max:98.4 F (36.9 C)  Recent Labs  Lab 10/08/23 1818 10/08/23 2001 10/09/23 0631 10/10/23 1218 10/11/23 0627 10/12/23 0608 10/13/23 0646 10/14/23 0822  WBC  --   --    < > 14.2* 12.6* 11.9* 13.8* 14.3*  CREATININE  --   --    < > 3.77* 6.43* 8.00* 9.92* 11.91*  LATICACIDVEN 1.3 0.9  --   --   --   --   --   --   VANCORANDOM  --   --   --   --  21  --   --  17   < > = values in this interval not displayed.    Estimated Creatinine Clearance: 4.8 mL/min (A) (by C-G formula based on SCr of 11.91 mg/dL (H)).    No Known Allergies  Antimicrobials this admission: Vancomycin  5/28 >> Zosyn  5/28 x 1 CRO 5/29 >> 5/30  Dose adjustments this admission: 5/31 VR 21 mcg/ml 6/3 VR 17 mcg/ml  Microbiology results: 5/28 BCx >> MRSA 5/29 L-finger abscess >> E.coli + strep anginosus + B frag (not treating, s/p amputation) 6/2 Bcx ngtd  Thank you for allowing pharmacy to be a part of this patient's care.  Dorene Gang, PharmD, BCPS, BCCP Clinical Pharmacist  Please check AMION for all [facility] Pharmacy phone numbers After 10:00 PM, call Main Pharmacy [phone]

## 2023-10-15 NOTE — Progress Notes (Signed)
 [facility]    Date of Admission:  10/08/2023   Total days of antibiotics 8   ID: Alvin Daniels is a 53 y.o. male with T2DM, ESRD on HD, well controlled HIV disease, chronic hep C, PAD s/p bilateral bka now with MRSA complicated bacteremia and concern for endocarditis. 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  Underwent TEE: There is a partially mobile mass on the posterior right atrial wall.   Medications:   bictegravir-emtricitabine -tenofovir  AF  1 tablet Oral Daily   Chlorhexidine  Gluconate Cloth  6 each Topical Q0600   Chlorhexidine  Gluconate Cloth  6 each Topical Q0600   gabapentin   100 mg Oral QHS   heparin   5,000 Units Subcutaneous Q8H   insulin  aspart  0-5 Units Subcutaneous QHS   insulin  aspart  0-9 Units Subcutaneous TID WC   insulin  aspart protamine - aspart  5 Units Subcutaneous BID WC   lidocaine   1 patch Transdermal Q24H   lidocaine  (PF)  10 mL Infiltration Once   midodrine   10 mg Oral TID   vancomycin  variable dose per unstable renal function (pharmacist dosing)   Does not apply See admin instructions    Objective: Vital signs in last 24 hours: Temp:  [97.7 F (36.5 C)-98.4 F (36.9 C)] 98.2 F (36.8 C) (06/04 1420) Pulse Rate:  [73-88] 83 (06/04 1420) Resp:  [7-20] 14 (06/04 1330) BP: (80-114)/(51-76) 114/76 (06/04 1420) SpO2:  [96 %-100 %] 99 % (06/04 1420) Physical Exam  Constitutional: He is oriented to person, place, and time. He appears well-developed and well-nourished. No distress.  HENT:  Mouth/Throat: Oropharynx is clear and moist. No oropharyngeal exudate.  Cardiovascular: Normal rate, regular rhythm and normal heart sounds. Exam  reveals no gallop and no friction rub.  No murmur heard.  Pulmonary/Chest: Effort normal and breath sounds normal. No respiratory distress. He has no wheezes.  Abdominal: Soft. Bowel sounds are normal. He exhibits no distension. There is no tenderness.  Ext: + TTP on right SI joint. Left hand wrapped from 4th finger amputation Neurological: He is alert and oriented to person, place, and time.  Skin: Skin is warm and dry. No rash noted. No erythema.  Psychiatric: He has a normal mood and affect. His behavior is normal.    Lab Results Recent Labs    10/14/23 0822 10/15/23 1548  WBC 14.3* 10.8*  HGB 11.0* 10.7*  HCT 32.9* 31.6*  NA 129* 135  K 5.1 4.2  CL 92* 93*  CO2 15* 21*  BUN 98* 44*  CREATININE 11.91* 7.24*   Liver Panel Recent Labs    10/13/23 0646 10/14/23 0822 10/15/23 1548  PROT 6.8 7.3  --   ALBUMIN  2.1* 2.2* 2.3*  AST 27 23  --   ALT 18 16  --   ALKPHOS 432* 409*  --   BILITOT 1.2 1.4*  --     Microbiology: Blood cx 6/2 NGTD Studies/Results: EP STUDY Result Date: 10/15/2023 See surgical note for result.  IR Fluoro Guide CV Line Right Result Date: 10/14/2023 INDICATION: 53 year old male with history of end-stage renal disease on hemodialysis require replacement of tunneled hemodialysis catheter after line holiday for bacteremia. EXAM: TUNNELED CENTRAL VENOUS HEMODIALYSIS CATHETER PLACEMENT WITH  ULTRASOUND AND FLUOROSCOPIC GUIDANCE MEDICATIONS: Ancef  2 gm IV . The antibiotic was given in an appropriate time interval prior to skin puncture. ANESTHESIA/SEDATION: Moderate (conscious) sedation was employed during this procedure. A total of Versed  1 mg and Fentanyl  50 mcg was administered intravenously. Moderate Sedation Time: 12 minutes. The patient's level of consciousness and vital signs were monitored continuously by radiology nursing throughout the procedure under my direct supervision. FLUOROSCOPY TIME:  Four mGy, reference air kerma COMPLICATIONS: None immediate.  PROCEDURE: Informed written consent was obtained from the patient after a discussion of the risks, benefits, and alternatives to treatment. Questions regarding the procedure were encouraged and answered. The right neck and chest were prepped with chlorhexidine  in a sterile fashion, and a sterile drape was applied covering the operative field. Maximum barrier sterile technique with sterile gowns and gloves were used for the procedure. A timeout was performed prior to the initiation of the procedure. After creating a small venotomy incision, a 21 gauge micropuncture kit was utilized to access the internal jugular vein. Real-time ultrasound guidance was utilized for vascular access including the acquisition of a permanent ultrasound image documenting patency of the accessed vessel. A Rosen wire was advanced to the level of the IVC and the micropuncture sheath was exchanged for an 8 Fr dilator. A 14.5 French tunneled hemodialysis catheter measuring 19 cm from tip to cuff was tunneled in a retrograde fashion from the anterior chest wall to the venotomy incision. Serial dilation was then performed an a peel-away sheath was placed. The catheter was then placed through the peel-away sheath with the catheter tip ultimately positioned within the right atrium. Final catheter positioning was confirmed and documented with a spot radiographic image. The catheter aspirates and flushes normally. The catheter was flushed with appropriate volume heparin  dwells. The catheter exit site was secured with a 0-Silk retention suture. The venotomy incision was closed with Dermabond. Sterile dressings were applied. The patient tolerated the procedure well without immediate post procedural complication. IMPRESSION: Successful placement of 19 cm tip to cuff tunneled hemodialysis catheter via the right internal jugular vein with catheter tip terminating within the right atrium. The catheter is ready for immediate use. Creasie Doctor, MD [facility] Radiology Electronically Signed   By: Creasie Doctor M.D.   On: 10/14/2023 14:52   IR US  Guide Vasc Access Right Result Date: 10/14/2023 INDICATION: 53 year old male with history of end-stage renal disease on hemodialysis require replacement of tunneled hemodialysis catheter after line holiday for bacteremia. EXAM: TUNNELED CENTRAL VENOUS HEMODIALYSIS CATHETER PLACEMENT WITH ULTRASOUND AND FLUOROSCOPIC GUIDANCE MEDICATIONS: Ancef  2 gm IV . The antibiotic was given in an appropriate time interval prior to skin puncture. ANESTHESIA/SEDATION: Moderate (conscious) sedation was employed during this procedure. A total of Versed  1 mg and Fentanyl  50 mcg was administered intravenously. Moderate Sedation Time: 12 minutes. The patient's level of consciousness and vital signs were monitored continuously by radiology nursing throughout the procedure under my direct supervision. FLUOROSCOPY TIME:  Four mGy, reference air kerma COMPLICATIONS: None immediate. PROCEDURE: Informed written consent was obtained from the patient after a discussion of the risks, benefits, and alternatives to treatment. Questions regarding the procedure were encouraged and answered. The right neck and chest were prepped with chlorhexidine  in a sterile fashion, and a sterile drape was applied covering the operative field. Maximum barrier sterile technique with sterile gowns and gloves were used for the procedure. A timeout was performed prior to the initiation of the procedure. After creating a  small venotomy incision, a 21 gauge micropuncture kit was utilized to access the internal jugular vein. Real-time ultrasound guidance was utilized for vascular access including the acquisition of a permanent ultrasound image documenting patency of the accessed vessel. A Rosen wire was advanced to the level of the IVC and the micropuncture sheath was exchanged for an 8 Fr dilator. A 14.5 French tunneled  hemodialysis catheter measuring 19 cm from tip to cuff was tunneled in a retrograde fashion from the anterior chest wall to the venotomy incision. Serial dilation was then performed an a peel-away sheath was placed. The catheter was then placed through the peel-away sheath with the catheter tip ultimately positioned within the right atrium. Final catheter positioning was confirmed and documented with a spot radiographic image. The catheter aspirates and flushes normally. The catheter was flushed with appropriate volume heparin  dwells. The catheter exit site was secured with a 0-Silk retention suture. The venotomy incision was closed with Dermabond. Sterile dressings were applied. The patient tolerated the procedure well without immediate post procedural complication. IMPRESSION: Successful placement of 19 cm tip to cuff tunneled hemodialysis catheter via the right internal jugular vein with catheter tip terminating within the right atrium. The catheter is ready for immediate use. Creasie Doctor, MD [facility] Radiology Electronically Signed   By: Creasie Doctor M.D.   On: 10/14/2023 14:52   MR HIP RIGHT WO CONTRAST Result Date: 10/14/2023 CLINICAL DATA:  Soft tissue infection suspected, right hip. MRSA bacteremia. EXAM: MR OF THE RIGHT HIP WITHOUT CONTRAST TECHNIQUE: Multiplanar, multisequence MR imaging was performed. No intravenous contrast was administered. COMPARISON:  Lumbar MRI 10/10/2023, abdominopelvic CT 10/08/2023 and radiographs of the right hip 12/01/2019. FINDINGS: Technical note: Despite efforts by the technologist and patient, mild motion artifact is present on today's exam and could not be eliminated. This reduces exam sensitivity and specificity. Bones: There is no evidence of acute fracture, dislocation or femoral head osteonecrosis. No evidence of osteomyelitis. There is an intramedullary nail within the left femoral diaphysis. The visualized  sacroiliac joints and symphysis pubis appear normal. Articular cartilage and labrum Articular cartilage: No focal chondral defect or subchondral signal abnormality identified. Labrum: There is no gross labral tear or paralabral abnormality. Joint or bursal effusion Joint effusion: No significant hip joint effusion. Bursae: No focal periarticular fluid collection. Muscles and tendons Muscles and tendons: Generalized soft tissue edema throughout the pelvis with mild involvement of the gluteus musculature bilaterally. No focal fluid collections are identified. There is chronic atrophy of the right piriformis muscle. The gluteus, common hamstring and iliopsoas tendons are intact. Other findings Miscellaneous: Generalized subcutaneous edema throughout the pelvis without focal fluid collection. There is also generalized soft tissue edema throughout the pelvis with a small amount of pelvic ascites. The distal colon is fluid-filled with mild wall thickening. IMPRESSION: 1. Nonspecific generalized soft tissue edema throughout the pelvis with mild involvement of the gluteus musculature bilaterally. No focal fluid collections are identified. 2. No evidence of osteomyelitis or septic arthritis. 3. No acute osseous findings. 4. Fluid-filled distal colon with mild wall thickening, possibly reflecting colitis. Electronically Signed   By: Elmon Hagedorn M.D.   On: 10/14/2023 10:21     Assessment/Plan: MRSA bacteremia = in work up for endocarditis - TEE found There is a partially mobile mass on the posterior right atrial wall possibly associated with prior HD catheter. Cards recommend treatment for endocarditis with repeat TEE at end of treatment to see there is resolution  - will  plan to treat for 6 wks  - other nidus of infection maybe right SI septic arthritis.recommend to get mri of sacrum/pelvis  Osteomeylitis of 4th digit left hand = now have source control since amputation  Hiv disease = continue on biktarvy   daily  Esrd on hd = will renally dose cefazolin   Continue on contact precautions for mrsa  evaluation of this patient requires complex antimicrobial therapy evaluation and counseling and isolation needs for disease transmission risk assessment and mitigation.     [facility] Pager: [phone]  10/15/2023, 5:43 PM

## 2023-10-15 NOTE — Plan of Care (Signed)
  Problem: Pain Managment: Goal: General experience of comfort will improve and/or be controlled Outcome: Progressing   Problem: Safety: Goal: Ability to remain free from injury will improve Outcome: Progressing

## 2023-10-15 NOTE — Interval H&P Note (Signed)
 History and Physical Interval Note:  10/15/2023 12:48 PM  Alvin Daniels  has presented today for surgery, with the diagnosis of bacteremia.  The various methods of treatment have been discussed with the patient and family. After consideration of risks, benefits and other options for treatment, the patient has consented to  Procedure(s): TRANSESOPHAGEAL ECHOCARDIOGRAM (N/A) as a surgical intervention.  The patient's history has been reviewed, patient examined, no change in status, stable for surgery.  I have reviewed the patient's chart and labs.  Questions were answered to the patient's satisfaction.     Blossom Crume [employer]

## 2023-10-16 LAB — GLUCOSE, CAPILLARY
Glucose-Capillary: 191 mg/dL — ABNORMAL HIGH (ref 70–99)
Glucose-Capillary: 138 mg/dL — ABNORMAL HIGH (ref 70–99)
Glucose-Capillary: 165 mg/dL — ABNORMAL HIGH (ref 70–99)
Glucose-Capillary: 66 mg/dL — ABNORMAL LOW (ref 70–99)
Glucose-Capillary: 72 mg/dL (ref 70–99)

## 2023-10-16 LAB — CBC
HCT: 32.9 % — ABNORMAL LOW (ref 39.0–52.0)
Hemoglobin: 11 g/dL — ABNORMAL LOW (ref 13.0–17.0)
MCH: 32.4 pg (ref 26.0–34.0)
MCHC: 33.4 g/dL (ref 30.0–36.0)
MCV: 97.1 fL (ref 80.0–100.0)
Platelets: 156 10*3/uL (ref 150–400)
RBC: 3.39 MIL/uL — ABNORMAL LOW (ref 4.22–5.81)
RDW: 15.1 % (ref 11.5–15.5)
WBC: 10.7 10*3/uL — ABNORMAL HIGH (ref 4.0–10.5)
nRBC: 0 % (ref 0.0–0.2)

## 2023-10-16 LAB — RENAL FUNCTION PANEL
Albumin: 2.2 g/dL — ABNORMAL LOW (ref 3.5–5.0)
Anion gap: 18 — ABNORMAL HIGH (ref 5–15)
BUN: 51 mg/dL — ABNORMAL HIGH (ref 6–20)
CO2: 19 mmol/L — ABNORMAL LOW (ref 22–32)
Calcium: 8.6 mg/dL — ABNORMAL LOW (ref 8.9–10.3)
Chloride: 95 mmol/L — ABNORMAL LOW (ref 98–111)
Creatinine, Ser: 8.38 mg/dL — ABNORMAL HIGH (ref 0.61–1.24)
GFR, Estimated: 7 mL/min — ABNORMAL LOW (ref >60)
Glucose, Bld: 212 mg/dL — ABNORMAL HIGH (ref 70–99)
Phosphorus: 6.8 mg/dL — ABNORMAL HIGH (ref 2.5–4.6)
Potassium: 4.2 mmol/L (ref 3.5–5.1)
Sodium: 132 mmol/L — ABNORMAL LOW (ref 135–145)

## 2023-10-16 MED ORDER — CHLORHEXIDINE GLUCONATE CLOTH 2 % EX PADS
6.0000 | MEDICATED_PAD | Freq: Every day | CUTANEOUS | Status: DC
  Administered 2023-10-17: 6 via TOPICAL

## 2023-10-16 MED ORDER — VANCOMYCIN IV (FOR PTA / DISCHARGE USE ONLY): 500.0000 mg | INTRAVENOUS | 0 refills | Status: AC

## 2023-10-16 MED ORDER — MIDODRINE HCL 5 MG PO TABS
ORAL_TABLET | ORAL | Status: AC
  Filled 2023-10-16: qty 1

## 2023-10-16 MED ORDER — INSULIN ASPART PROT & ASPART (70-30 MIX) 100 UNIT/ML ~~LOC~~ SUSP
8.0000 [IU] | Freq: Two times a day (BID) | SUBCUTANEOUS | Status: DC
  Administered 2023-10-16: 8 [IU] via SUBCUTANEOUS
  Filled 2023-10-16 (×2): qty 10

## 2023-10-16 MED ORDER — ALBUMIN HUMAN 25 % IV SOLN
25.0000 g | Freq: Once | INTRAVENOUS | Status: AC
  Administered 2023-10-16: 25 g via INTRAVENOUS

## 2023-10-16 MED ORDER — VANCOMYCIN HCL 500 MG/100ML IV SOLN: 500.0000 mg | INTRAVENOUS | Status: DC

## 2023-10-16 MED ORDER — HEPARIN SODIUM (PORCINE) 1000 UNIT/ML IJ SOLN
INTRAMUSCULAR | Status: AC
  Filled 2023-10-16: qty 4

## 2023-10-16 MED ORDER — VANCOMYCIN HCL 500 MG/100ML IV SOLN
INTRAVENOUS | Status: AC
  Filled 2023-10-16: qty 100

## 2023-10-16 NOTE — Progress Notes (Signed)
 [facility] Progress Note   Subjective:   Seen on HD. Reports he is still having a lot of pain in his hip/back. Otherwise no concerns. Denies SOB, CP, dizziness.   Objective Vitals:   10/16/23 0931 10/16/23 1002 10/16/23 1031 10/16/23 1102  BP: (!) 89/56 115/71 99/67 94/66   Pulse: (!) 51 80 81 78  Resp: 17 10 13  (!) 8  Temp:      TempSrc:      SpO2: (!) 34% (!) 87% (!) 88% 100%  Weight:      Height:       Physical Exam  General: Alert male in NAD Heart: RRR, no murmurs, rubs or gallops Lungs: CTA bilaterally, respirations unlabored on RA Abdomen: Soft, non-distended, +BS Extremities: B/l BKA, no edema noted Dialysis Access:  [facility] accessed, appears to have small skin tear under edge of bandage but no bleeding/drainage from [facility] exit site  Additional Objective Labs: Basic Metabolic Panel: Recent Labs  Lab 10/10/23 1218 10/11/23 0627 10/14/23 0822 10/15/23 1548 10/16/23 0607  NA 135   < > 129* 135 132*  K 3.7   < > 5.1 4.2 4.2  CL 95*   < > 92* 93* 95*  CO2 21*   < > 15* 21* 19*  GLUCOSE 131*   < > 202* 177* 212*  BUN 15   < > 98* 44* 51*  CREATININE 3.77*   < > 11.91* 7.24* 8.38*  CALCIUM  8.8*   < > 9.2 8.6* 8.6*  PHOS 2.1*  --   --  5.7* 6.8*   < > = values in this interval not displayed.   Liver Function Tests: Recent Labs  Lab 10/12/23 0608 10/13/23 0646 10/14/23 0822 10/15/23 1548 10/16/23 0607  AST 33 27 23  --   --   ALT 21 18 16   --   --   ALKPHOS 488* 432* 409*  --   --   BILITOT 1.4* 1.2 1.4*  --   --   PROT 6.9 6.8 7.3  --   --   ALBUMIN  2.2* 2.1* 2.2* 2.3* 2.2*   No results for input(s): "LIPASE", "AMYLASE" in the last 168 hours. CBC: Recent Labs  Lab 10/12/23 0608 10/13/23 0646 10/14/23 0822 10/15/23 1548 10/16/23 0607  WBC 11.9* 13.8* 14.3* 10.8* 10.7*  HGB 11.1* 11.1* 11.0* 10.7* 11.0*  HCT 32.1* 32.7* 32.9* 31.6* 32.9*  MCV 96.4 96.7 95.6 98.1 97.1  PLT 83* 91* 110* 129* 156   Blood Culture    Component Value  Date/Time   SDES BLOOD RIGHT HAND 10/13/2023 1352   SPECREQUEST  10/13/2023 1352    BOTTLES DRAWN AEROBIC AND ANAEROBIC Blood Culture adequate volume   CULT  10/13/2023 1352    NO GROWTH 2 DAYS Performed at [facility], [address] [place], [place] [address]    REPTSTATUS PENDING 10/13/2023 1352    Cardiac Enzymes: No results for input(s): "CKTOTAL", "CKMB", "CKMBINDEX", "TROPONINI" in the last 168 hours. CBG: Recent Labs  Lab 10/15/23 0630 10/15/23 1141 10/15/23 1619 10/15/23 2234 10/16/23 0639  GLUCAP 206* 179* 185* 255* 191*   Iron  Studies: No results for input(s): "IRON ", "TIBC", "TRANSFERRIN", "FERRITIN" in the last 72 hours. @lablastinr3 @ Studies/Results: MR SACRUM SI JOINTS W WO CONTRAST Result Date: 10/15/2023 CLINICAL DATA:  Lower back pain. Infection or inflammation suspected. MRSA bacteremia. EXAM: MRI LUMBAR SPINE WITHOUT AND WITH CONTRAST TECHNIQUE: Multiplanar and multiecho pulse sequences of the lumbar spine were obtained without and with intravenous contrast. CONTRAST:  4mL GADAVIST  GADOBUTROL  1 MMOL/ML IV SOLN COMPARISON:  MRI right hip 10/13/2023, CT abdomen and pelvis 10/08/2023 FINDINGS: Despite efforts by the technologist and patient, mild-to-moderate motion artifact is present on today's exam and could not be eliminated. This reduces exam sensitivity and specificity. Bones/joints/cartilage: Within the limitations of motion artifact, abnormal sacroiliac joint fluid or subchondral erosion is seen. No marrow edema or cortical erosion is seen within the visualized portions of the sacrum, ischium, ilium, pubis, or proximal femurs. Ligaments: No ligament tear is seen. Muscles and tendons: There is mild to moderate fluid deep to the bilateral iliacus muscles. No cortical erosion or edema is seen within the adjacent iliac bones. No tendon tear is identified. There is again generalized edema within the bilateral gluteus minimus greater than gluteus medius and maximus  muscles. Soft tissues: There is again diffuse mild to moderate wall thickening of the visualized rectum and sigmoid colon. Trace pelvic ascites again noted, layering dependently. IMPRESSION: 1. Mild-to-moderate fluid deep to the bilateral iliacus muscles. No cortical erosion or edema is seen within the adjacent iliac bones. This is nonspecific and may represent mild inflammatory change. 2. No evidence of osteomyelitis or sacroiliitis. 3. Redemonstration of nonspecific generalized edema within the bilateral gluteus minimus greater than gluteus medius and maximus muscles. 4. Again diffuse mild to moderate wall thickening of the visualized rectum and sigmoid colon. This is nonspecific and again may be due to colitis. Electronically Signed   By: Bertina Broccoli M.D.   On: 10/15/2023 22:21   ECHO TEE Result Date: 10/15/2023    TRANSESOPHOGEAL ECHO REPORT   Patient Name:   FAVIAN KITTLESON Date of Exam: 10/15/2023 Medical Rec #:  [id number]                        Height:       59.0 in Accession #:    [id number]                       Weight:       103.6 lb Date of Birth:  [birth year]                         BSA:          1.395 m Patient Age:    53 years                         BP:           103/70 mmHg Patient Gender: M                                HR:           82 bpm. Exam Location:  Inpatient Procedure: Transesophageal Echo, Color Doppler and Cardiac Doppler (Both            Spectral and Color Flow Doppler were utilized during procedure). Indications:     Endocarditis  History:         Patient has prior history of Echocardiogram examinations, most                  recent 10/12/2023. End stage renal disease. HIV. Hepatitis C.;                  Risk Factors:Diabetes.  Sonographer:     Dione Franks RDCS  Referring Phys:  [id number] Leala Prince Diagnosing Phys: Alen Husbands McleanMD PROCEDURE: After discussion of the risks and benefits of a TEE, an informed consent was obtained from the patient. The transesophogeal  probe was passed without difficulty through the esophogus of the patient. Imaged were obtained with the patient in a left lateral decubitus position. Sedation performed by different physician. The patient was monitored while under deep sedation. Anesthestetic sedation was provided intravenously by Anesthesiology: 100mg  of Propofol , 50mg  of Lidocaine . The patient developed no complications during the procedure.  IMPRESSIONS  1. Left ventricular ejection fraction, by estimation, is 60 to 65%. The left ventricle has normal function. The left ventricle has no regional wall motion abnormalities. There is mild concentric left ventricular hypertrophy.  2. Right ventricular systolic function is normal. The right ventricular size is normal.  3. No left atrial/left atrial appendage thrombus was detected.  4. There is a 1.8 x 1.2 mass on the posterior right atrial wall, mass is partially mobile. This may be a vegetation from where the old HD catheter impinged on the RA wall (now removed and replaced). The new HD catheter has no vegetation and does not impinge on the mass. Cannot rule out tumor, etc. Think he will need repeat study after trial of antibiotic treatment to ensure resolution.  5. No PFO/ASD by color doppler.  6. The aortic valve is tricuspid. Aortic valve regurgitation is not visualized. No aortic stenosis is present. No aortic valve vegetation.  7. The mitral valve is normal in structure. No evidence of mitral valve regurgitation. No evidence of mitral stenosis. No mitral valve vegetation.  8. No tricuspid valve vegetation. FINDINGS  Left Ventricle: Left ventricular ejection fraction, by estimation, is 60 to 65%. The left ventricle has normal function. The left ventricle has no regional wall motion abnormalities. The left ventricular internal cavity size was normal in size. There is  mild concentric left ventricular hypertrophy. Right Ventricle: The right ventricular size is normal. No increase in right  ventricular wall thickness. Right ventricular systolic function is normal. Left Atrium: Left atrial size was normal in size. No left atrial/left atrial appendage thrombus was detected. Right Atrium: Right atrial size was normal in size. Pericardium: There is no evidence of pericardial effusion. Mitral Valve: The mitral valve is normal in structure. No evidence of mitral valve regurgitation. No evidence of mitral valve stenosis. Tricuspid Valve: The tricuspid valve is normal in structure. Tricuspid valve regurgitation is not demonstrated. Aortic Valve: The aortic valve is tricuspid. Aortic valve regurgitation is not visualized. No aortic stenosis is present. Pulmonic Valve: The pulmonic valve was normal in structure. Pulmonic valve regurgitation is not visualized. Aorta: The aortic root and ascending aorta are structurally normal, with no evidence of dilitation. IAS/Shunts: No PFO/ASD by color doppler. [employer] Electronically signed by Archer Bear Signature Date/Time: 10/15/2023/8:26:38 PM    Final    EP STUDY Result Date: 10/15/2023 See surgical note for result.  IR Fluoro Guide CV Line Right Result Date: 10/14/2023 INDICATION: 53 year old male with history of end-stage renal disease on hemodialysis require replacement of tunneled hemodialysis catheter after line holiday for bacteremia. EXAM: TUNNELED CENTRAL VENOUS HEMODIALYSIS CATHETER PLACEMENT WITH ULTRASOUND AND FLUOROSCOPIC GUIDANCE MEDICATIONS: Ancef  2 gm IV . The antibiotic was given in an appropriate time interval prior to skin puncture. ANESTHESIA/SEDATION: Moderate (conscious) sedation was employed during this procedure. A total of Versed  1 mg and Fentanyl  50 mcg was administered intravenously. Moderate Sedation Time: 12 minutes. The patient's level of consciousness and vital signs were  monitored continuously by radiology nursing throughout the procedure under my direct supervision. FLUOROSCOPY TIME:  Four mGy, reference air kerma  COMPLICATIONS: None immediate. PROCEDURE: Informed written consent was obtained from the patient after a discussion of the risks, benefits, and alternatives to treatment. Questions regarding the procedure were encouraged and answered. The right neck and chest were prepped with chlorhexidine  in a sterile fashion, and a sterile drape was applied covering the operative field. Maximum barrier sterile technique with sterile gowns and gloves were used for the procedure. A timeout was performed prior to the initiation of the procedure. After creating a small venotomy incision, a 21 gauge micropuncture kit was utilized to access the internal jugular vein. Real-time ultrasound guidance was utilized for vascular access including the acquisition of a permanent ultrasound image documenting patency of the accessed vessel. A Rosen wire was advanced to the level of the IVC and the micropuncture sheath was exchanged for an 8 Fr dilator. A 14.5 French tunneled hemodialysis catheter measuring 19 cm from tip to cuff was tunneled in a retrograde fashion from the anterior chest wall to the venotomy incision. Serial dilation was then performed an a peel-away sheath was placed. The catheter was then placed through the peel-away sheath with the catheter tip ultimately positioned within the right atrium. Final catheter positioning was confirmed and documented with a spot radiographic image. The catheter aspirates and flushes normally. The catheter was flushed with appropriate volume heparin  dwells. The catheter exit site was secured with a 0-Silk retention suture. The venotomy incision was closed with Dermabond. Sterile dressings were applied. The patient tolerated the procedure well without immediate post procedural complication. IMPRESSION: Successful placement of 19 cm tip to cuff tunneled hemodialysis catheter via the right internal jugular vein with catheter tip terminating within the right atrium. The catheter is ready for immediate  use. Creasie Doctor, MD [facility] Electronically Signed   By: Creasie Doctor M.D.   On: 10/14/2023 14:52   IR US  Guide Vasc Access Right Result Date: 10/14/2023 INDICATION: 53 year old male with history of end-stage renal disease on hemodialysis require replacement of tunneled hemodialysis catheter after line holiday for bacteremia. EXAM: TUNNELED CENTRAL VENOUS HEMODIALYSIS CATHETER PLACEMENT WITH ULTRASOUND AND FLUOROSCOPIC GUIDANCE MEDICATIONS: Ancef  2 gm IV . The antibiotic was given in an appropriate time interval prior to skin puncture. ANESTHESIA/SEDATION: Moderate (conscious) sedation was employed during this procedure. A total of Versed  1 mg and Fentanyl  50 mcg was administered intravenously. Moderate Sedation Time: 12 minutes. The patient's level of consciousness and vital signs were monitored continuously by radiology nursing throughout the procedure under my direct supervision. FLUOROSCOPY TIME:  Four mGy, reference air kerma COMPLICATIONS: None immediate. PROCEDURE: Informed written consent was obtained from the patient after a discussion of the risks, benefits, and alternatives to treatment. Questions regarding the procedure were encouraged and answered. The right neck and chest were prepped with chlorhexidine  in a sterile fashion, and a sterile drape was applied covering the operative field. Maximum barrier sterile technique with sterile gowns and gloves were used for the procedure. A timeout was performed prior to the initiation of the procedure. After creating a small venotomy incision, a 21 gauge micropuncture kit was utilized to access the internal jugular vein. Real-time ultrasound guidance was utilized for vascular access including the acquisition of a permanent ultrasound image documenting patency of the accessed vessel. A Rosen wire was advanced to the level of the IVC and the micropuncture sheath was exchanged for an 8 Fr  dilator. A  14.5 French tunneled hemodialysis catheter measuring 19 cm from tip to cuff was tunneled in a retrograde fashion from the anterior chest wall to the venotomy incision. Serial dilation was then performed an a peel-away sheath was placed. The catheter was then placed through the peel-away sheath with the catheter tip ultimately positioned within the right atrium. Final catheter positioning was confirmed and documented with a spot radiographic image. The catheter aspirates and flushes normally. The catheter was flushed with appropriate volume heparin  dwells. The catheter exit site was secured with a 0-Silk retention suture. The venotomy incision was closed with Dermabond. Sterile dressings were applied. The patient tolerated the procedure well without immediate post procedural complication. IMPRESSION: Successful placement of 19 cm tip to cuff tunneled hemodialysis catheter via the right internal jugular vein with catheter tip terminating within the right atrium. The catheter is ready for immediate use. Creasie Doctor, MD [facility] Radiology Electronically Signed   By: Creasie Doctor M.D.   On: 10/14/2023 14:52   Medications:  anticoagulant sodium citrate     vancomycin  500 mg (10/16/23 1059)    bictegravir-emtricitabine -tenofovir  AF  1 tablet Oral Daily   Chlorhexidine  Gluconate Cloth  6 each Topical Q0600   gabapentin   100 mg Oral QHS   heparin   5,000 Units Subcutaneous Q8H   insulin  aspart  0-5 Units Subcutaneous QHS   insulin  aspart  0-9 Units Subcutaneous TID WC   insulin  aspart protamine - aspart  8 Units Subcutaneous BID WC   lidocaine   1 patch Transdermal Q24H   lidocaine  (PF)  10 mL Infiltration Once   midodrine   10 mg Oral TID   vancomycin  variable dose per unstable renal function (pharmacist dosing)   Does not apply See admin instructions    Dialysis Orders: [facility] MWF 3.5h  B400  45.5kg   TDC   Heparin  2000 Last OP HD 5/28, post wt 45.7kg Very  small wt gains, always makes it to dry wt  Assessment/Plan: MRSA bacteremia.  IV Vancomycin  started. Had [facility] in place,  also w finger necrosis. ID consulted. R removed TDC for line holiday 6/1, replaced 6/3. Repeat Bcx negative so far  Finger infection. Osteo on imaging. 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  ESRD: on HD MWF.  Last HD 5/30. Off schedule due to line holiday, last HD Tuesday. Will plan for HD Thursday/Saturday then resume MWF next week Chronic hypotension: BP's at OP HD as typically low in the 80's-90s or lower during the sessions.  Volume Appears euvolemic, tolerating UF with midodrine  Anemia of esrd: Hgb 10-11, follow.  Secondary hyperparathyroidism: Calcium /Phos acceptable. Continue home meds.  HIV. On Bictarvy.     Ramona Burner, PA-C 10/16/2023, 11:04 AM  [facility] Pager: [phone]

## 2023-10-16 NOTE — Progress Notes (Signed)
 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 TEE revealed partial mobile mass on posterior atrial wall, possible from thrombus of prior hd catheter.  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 We iwll see back in the ID clinic in 6 wk  Julia Alkhatib B. Levern Reader MD MPH [facility] [phone]

## 2023-10-16 NOTE — Progress Notes (Signed)
 This RN receiving pt from [facility], RN @ 1540 on [address] [place].

## 2023-10-16 NOTE — Progress Notes (Signed)
 Received patient in bed.Alert and oriented x 4.Consent verified.  Access used: Right Hd catheter that worked well.Dressing change done today.Noticed burn would like around the catheter site,some were bleeding. New dressing applied.  Duration of treatment : 3.5 hours.  Uf goal :1.9  Medicine given: Midodrine  10 mg.                           Albumin  25 g.= 120 cc.                           Vancomycin   500 mg= 100 cc.  Hand off to the patient's nurse ,with stable condition via transporter.

## 2023-10-16 NOTE — Plan of Care (Signed)
   Problem: Metabolic: Goal: Ability to maintain appropriate glucose levels will improve Outcome: Progressing   Problem: Nutritional: Goal: Maintenance of adequate nutrition will improve Outcome: Progressing Goal: Progress toward achieving an optimal weight will improve Outcome: Progressing

## 2023-10-16 NOTE — Progress Notes (Signed)
 PROGRESS NOTE    Alvin Daniels  QIH:[id number] DOB: 01-16-1971 DOA: 10/08/2023 PCP: [name], MD    Brief Narrative:   Alvin Daniels is a 53 y.o. Spanish-speaking male with past medical history significant for ESRD on HD MWF, type 2 diabetes mellitus poorly controlled, HIV, chronic hepatitis C, PAD s/p bilateral BKA who presented to [facility] ED on 10/08/2023 with complaints of nausea/vomiting, abdominal pain, fever.  Additionally complaining of low back, hip pain, swelling to his left fourth finger in which she is developed a wound over the last 4 weeks.  In the ED, temperature 98.1 F,  RR 14, HR 81, BP 96/56, SpO2 97% on room air.  Initial labs significant for WBC 12.2, Hgb 10.6, platelet 87, sodium 136, K+ 3.1, stable kidney function, albumin  2.7, AST/ALT 52/34, alk phos 261, bilirubin 1.4, normal lipase, INR and lactic acid, mag 2.1, troponin 53.  EKG shows sinus rhythm. Left hand x-ray shows soft tissue swelling with bony cortical destruction involving the fourth mid and distal phalanx consistent with osteomyelitis. CT A/P without contrast shows mild CBD dilation and hepatomegaly. RUQ U/S shows gallbladder sludge with mild gallbladder wall thickening but no evidence of acute cholecystitis and minimally increased common bile duct.  Sepsis protocol was activated and patient received IV NS 2 L x 2, IV vancomycin  and IV Zosyn . Hand surgery was consulted.  TRH consulted for admission for further evaluation and management of sepsis secondary to osteomyelitis left fourth finger.  Assessment & Plan:   Severe sepsis, POA Patient presenting with worsening pain/edema to left fourth finger with wound.  Left hand x-ray with soft tissue swelling, bony cortical destruction fourth mid/distal phalanx consistent with osteomyelitis.  Also positive blood cultures with MRSA. -- Continue treatment as below  Osteomyelitis left fourth finger Patient with 1 month history of  worsening left fourth finger pain/edema with development of wound.  Left hand x-ray with soft tissue swelling with bony cortical destruction involving the fourth mid/distal phalanx consistent with osteomyelitis.  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 [name]. -- Operative culture with E. coli, Streptococcus anginosus -- Continues on antibiotics with vancomycin  per infectious disease -- Okay to start washing hand with warm, soapy water on 10/16/2023, no plans for additional surgery -- Outpatient follow-up with orthopedic hand surgery  MRSA septicemia Concern for right posterior atrial wall vegetation/mass Suspect etiology likely secondary to HD catheter.  TDC removed for line holiday on 6/1, replaced on 6/3.  TTE with LVEF 60 to 65%, no LV RWMA, RV systolic function normal, catheter right atrium with semimobile densities concerning for possible vegetation, IVC normal in size.  TEE 6/4 with findings of a partial Lee mobile mass posterior right atrial wall, may be vegetation/infection from wire old HD catheter transiting impinged RA wall, new HD catheter with no vegetation and does not impinge on the vegetation, cannot rule out tumor. -- Infectious disease following, appreciate assistance -- Blood cultures x 2: 5/28: + MRSA -- Blood cultures x 2: 6/2: now growth x 2 days -- Vancomycin , pharmacy consulted for dosing/monitoring -- Contact precautions -- ID plans continue vancomycin  with HD through November 24, 2023 with outpatient follow-up with ID -- Will need repeat TEE after trial of antibiotics to ensure resolution per cardiology  Right hip pain MR right hip with generalized soft tissue edema throughout the pelvis with mild involvement of the gluteus musculature no focal fluid collections, no evidence of osteomyelitis or septic arthritis, no acute osseous findings.  MR SI joint with mild-moderate fluid deep to the bilateral iliac us   muscles, no cortical erosion or edema with the adjacent iliac bones, no evidence of osteomyelitis or sacroiliitis, nonspecific generalized edema within the bilateral gluteus minimus greater than gluteus medius and maximus muscles.   Chronic hypotension: -- Midodrine  10 mg p.o. 3 times daily   Elevated alk phos Gallbladder sludge: ALP 261, mildly elevated AST to 62 and bilirubin of 1.4. RUQ U/S shows some gallbladder sludge but no acute cholecystitis, CBD minimally increased from 2021. Denies RUQ pain.      ESRD on HD MWF Anemia of chronic disease Mild hypokalemia Metabolic bone disease: TDC removed on 6/1, replaced on 6/3. -- Nephrology following continued HD while inpatient, appreciate assistance    Type 2 diabetes with hyperglycemia: Hemoglobin A1c 10.0% on 07/17/2023.  PTA on NovoLog  70/30 at 15 units bid -- NovoLog  70/30 5 units  BID -- Sensitive SSI for coverage -- CBGs qAC/HS  HIV: Last CD4 count 315, viral load 27 on 04/16/23 now < 20, CD4 254 from 315.  -- Continue Biktarvy  per ID   Thrombocytopenia, chronic: -- Plt 87>>53>91>110, stable   Hx Bilateral BKA: -- Cont b/l prosthetics    DVT prophylaxis: heparin  injection 5,000 Units Start: 10/09/23 0030    Code Status: Full Code Family Communication: No family present bedside this morning  Disposition Plan:  Level of care: Telemetry Medical Status is: Inpatient Remains inpatient appropriate because: IV antibiotics    Consultants:  Infectious disease Interventional radiology Nephrology  Procedures:  [facility] removal 6/1 TDC replacement 6/3  Antimicrobials:  Vancomycin  5/28>> Zosyn  5/28 - 5/28  ceftriaxone  5/29 - 5/30  cefazolin  6/3 - 6/3   Subjective: Patient seen examined bedside, currently in HD.  Complaining of continued right hip pain.  Has undergone MRI of his right hip and SI joint with no significant findings.  Discussed with infectious disease, Dr. Levern Reader; will be placing finalize recommendations to  continue vancomycin  with HD through November 24, 2023.  No other questions, concerns or complaints at this time.  Denies headache, no dizziness, no chest pain, no palpitations, no shortness of breath, no abdominal pain, no fever/chills/night sweats, no nausea cefonicid diarrhea.  No acute events overnight per nursing staff.  Objective: Vitals:   10/16/23 1136 10/16/23 1137 10/16/23 1152 10/16/23 1153  BP: 99/71 99/71 (!) 85/63 102/80  Pulse:  81 77 82  Resp:  (!) 4 11 (!) 30  Temp:   98 F (36.7 C) 98 F (36.7 C)  TempSrc:    Oral  SpO2:  100% 100% 100%  Weight:      Height:        Intake/Output Summary (Last 24 hours) at 10/16/2023 1222 Last data filed at 10/16/2023 1153 Gross per 24 hour  Intake 100 ml  Output 1900 ml  Net -1800 ml   Filed Weights   10/14/23 1351 10/14/23 1735 10/16/23 0753  Weight: 47.2 kg 47 kg 47.4 kg    Examination:  Physical Exam: GEN: NAD, alert and oriented x 3, chronically ill appearance, appears older than stated age HEENT: NCAT, PERRL, EOMI, sclera clear, poor dentition PULM: CTAB w/o wheezes/crackles, normal respiratory effort, room air CV: RRR w/o M/G/R, TDC noted to right chest GI: abd soft, NTND, + BS MSK: Noted bilateral BKA, moves all extremities independently, left hand with dressing in place, noted left fourth finger amputation, dressing clean/dry/intact NEURO: No focal neurological deficit PSYCH: normal mood/affect Integumentary: Bilateral BKA and left hand as above, otherwise no  other concerning rashes/lesions/wounds noted exposed skin surfaces    Data Reviewed: I have personally reviewed following labs and imaging studies  CBC: Recent Labs  Lab 10/12/23 0608 10/13/23 0646 10/14/23 0822 10/15/23 1548 10/16/23 0607  WBC 11.9* 13.8* 14.3* 10.8* 10.7*  HGB 11.1* 11.1* 11.0* 10.7* 11.0*  HCT 32.1* 32.7* 32.9* 31.6* 32.9*  MCV 96.4 96.7 95.6 98.1 97.1  PLT 83* 91* 110* 129* 156   Basic Metabolic Panel: Recent Labs  Lab  10/10/23 1218 10/11/23 0627 10/12/23 0608 10/13/23 0646 10/14/23 0822 10/15/23 1548 10/16/23 0607  NA 135   < > 128* 129* 129* 135 132*  K 3.7   < > 3.9 4.5 5.1 4.2 4.2  CL 95*   < > 94* 93* 92* 93* 95*  CO2 21*   < > 22 17* 15* 21* 19*  GLUCOSE 131*   < > 226* 198* 202* 177* 212*  BUN 15   < > 58* 79* 98* 44* 51*  CREATININE 3.77*   < > 8.00* 9.92* 11.91* 7.24* 8.38*  CALCIUM  8.8*   < > 9.1 9.1 9.2 8.6* 8.6*  PHOS 2.1*  --   --   --   --  5.7* 6.8*   < > = values in this interval not displayed.   GFR: Estimated Creatinine Clearance: 6.9 mL/min (A) (by C-G formula based on SCr of 8.38 mg/dL (H)). Liver Function Tests: Recent Labs  Lab 10/10/23 0628 10/10/23 1218 10/11/23 0627 10/12/23 0608 10/13/23 0646 10/14/23 0822 10/15/23 1548 10/16/23 0607  AST 31  --  31 33 27 23  --   --   ALT 22  --  20 21 18 16   --   --   ALKPHOS 315*  --  411* 488* 432* 409*  --   --   BILITOT 1.3*  --  1.5* 1.4* 1.2 1.4*  --   --   PROT 6.7  --  6.7 6.9 6.8 7.3  --   --   ALBUMIN  2.3*   < > 2.2* 2.2* 2.1* 2.2* 2.3* 2.2*   < > = values in this interval not displayed.   No results for input(s): "LIPASE", "AMYLASE" in the last 168 hours.  No results for input(s): "AMMONIA" in the last 168 hours. Coagulation Profile: No results for input(s): "INR", "PROTIME" in the last 168 hours.  Cardiac Enzymes: No results for input(s): "CKTOTAL", "CKMB", "CKMBINDEX", "TROPONINI" in the last 168 hours. BNP (last 3 results) No results for input(s): "PROBNP" in the last 8760 hours. HbA1C: No results for input(s): "HGBA1C" in the last 72 hours. CBG: Recent Labs  Lab 10/15/23 0630 10/15/23 1141 10/15/23 1619 10/15/23 2234 10/16/23 0639  GLUCAP 206* 179* 185* 255* 191*   Lipid Profile: No results for input(s): "CHOL", "HDL", "LDLCALC", "TRIG", "CHOLHDL", "LDLDIRECT" in the last 72 hours. Thyroid Function Tests: No results for input(s): "TSH", "T4TOTAL", "FREET4", "T3FREE", "THYROIDAB" in the last  72 hours. Anemia Panel: No results for input(s): "VITAMINB12", "FOLATE", "FERRITIN", "TIBC", "IRON ", "RETICCTPCT" in the last 72 hours. Sepsis Labs: No results for input(s): "PROCALCITON", "LATICACIDVEN" in the last 168 hours.   Recent Results (from the past 240 hours)  Blood Cultures x 2 sites     Status: Abnormal   Collection Time: 10/08/23  7:38 PM   Specimen: BLOOD RIGHT HAND  Result Value Ref Range Status   Specimen Description BLOOD RIGHT HAND  Final   Special Requests   Final    BOTTLES DRAWN AEROBIC AND ANAEROBIC Blood Culture  adequate volume   Culture  Setup Time   Final    GRAM POSITIVE COCCI IN BOTH AEROBIC AND ANAEROBIC BOTTLES CRITICAL VALUE NOTED.  VALUE IS CONSISTENT WITH PREVIOUSLY REPORTED AND CALLED VALUE.    Culture (A)  Final    STAPHYLOCOCCUS AUREUS SUSCEPTIBILITIES PERFORMED ON PREVIOUS CULTURE WITHIN THE LAST 5 DAYS. Performed at [facility], [address], [place], [place] [address]    Report Status 10/11/2023 FINAL  Final  Blood Cultures x 2 sites     Status: Abnormal   Collection Time: 10/08/23  7:43 PM   Specimen: BLOOD RIGHT FOREARM  Result Value Ref Range Status   Specimen Description BLOOD RIGHT FOREARM  Final   Special Requests   Final    BOTTLES DRAWN AEROBIC AND ANAEROBIC Blood Culture adequate volume   Culture  Setup Time   Final    GRAM POSITIVE COCCI IN CLUSTERS IN BOTH AEROBIC AND ANAEROBIC BOTTLES CRITICAL RESULT CALLED TO, READ BACK BY AND VERIFIED WITH: Carmie Chough on [id number] @0900  by SM Performed at [facility], [address] [place], [place] [address]    Culture METHICILLIN RESISTANT STAPHYLOCOCCUS AUREUS (A)  Final   Report Status 10/11/2023 FINAL  Final   Organism ID, Bacteria METHICILLIN RESISTANT STAPHYLOCOCCUS AUREUS  Final      Susceptibility   Methicillin resistant staphylococcus aureus - MIC*    CIPROFLOXACIN  <=0.5 SENSITIVE Sensitive     ERYTHROMYCIN  >=8 RESISTANT Resistant     GENTAMICIN <=0.5 SENSITIVE  Sensitive     OXACILLIN >=4 RESISTANT Resistant     TETRACYCLINE >=16 RESISTANT Resistant     VANCOMYCIN  1 SENSITIVE Sensitive     TRIMETH /SULFA  <=10 SENSITIVE Sensitive     CLINDAMYCIN <=0.25 SENSITIVE Sensitive     RIFAMPIN <=0.5 SENSITIVE Sensitive     Inducible Clindamycin NEGATIVE Sensitive     LINEZOLID  2 SENSITIVE Sensitive     * METHICILLIN RESISTANT STAPHYLOCOCCUS AUREUS  Blood Culture ID Panel (Reflexed)     Status: Abnormal   Collection Time: 10/08/23  7:43 PM  Result Value Ref Range Status   Enterococcus faecalis NOT DETECTED NOT DETECTED Final   Enterococcus Faecium NOT DETECTED NOT DETECTED Final   Listeria monocytogenes NOT DETECTED NOT DETECTED Final   Staphylococcus species DETECTED (A) NOT DETECTED Final    Comment: CRITICAL RESULT CALLED TO, READ BACK BY AND VERIFIED WITH: Carmie Chough on [id number] @0900  by SM    Staphylococcus aureus (BCID) DETECTED (A) NOT DETECTED Final    Comment: Methicillin (oxacillin)-resistant Staphylococcus aureus (MRSA). MRSA is predictably resistant to beta-lactam antibiotics (except ceftaroline). Preferred therapy is vancomycin  unless clinically contraindicated. Patient requires contact precautions if  hospitalized. CRITICAL RESULT CALLED TO, READ BACK BY AND VERIFIED WITH: Carmie Chough on [phone] @0900  by SM    Staphylococcus epidermidis NOT DETECTED NOT DETECTED Final   Staphylococcus lugdunensis NOT DETECTED NOT DETECTED Final   Streptococcus species NOT DETECTED NOT DETECTED Final   Streptococcus agalactiae NOT DETECTED NOT DETECTED Final   Streptococcus pneumoniae NOT DETECTED NOT DETECTED Final   Streptococcus pyogenes NOT DETECTED NOT DETECTED Final   A.calcoaceticus-baumannii NOT DETECTED NOT DETECTED Final   Bacteroides fragilis NOT DETECTED NOT DETECTED Final   Enterobacterales NOT DETECTED NOT DETECTED Final   Enterobacter cloacae complex NOT DETECTED NOT DETECTED Final   Escherichia coli NOT DETECTED NOT DETECTED Final    Klebsiella aerogenes NOT DETECTED NOT DETECTED Final   Klebsiella oxytoca NOT DETECTED NOT DETECTED Final   Klebsiella pneumoniae NOT  DETECTED NOT DETECTED Final   Proteus species NOT DETECTED NOT DETECTED Final   Salmonella species NOT DETECTED NOT DETECTED Final   Serratia marcescens NOT DETECTED NOT DETECTED Final   Haemophilus influenzae NOT DETECTED NOT DETECTED Final   Neisseria meningitidis NOT DETECTED NOT DETECTED Final   Pseudomonas aeruginosa NOT DETECTED NOT DETECTED Final   Stenotrophomonas maltophilia NOT DETECTED NOT DETECTED Final   Candida albicans NOT DETECTED NOT DETECTED Final   Candida auris NOT DETECTED NOT DETECTED Final   Candida glabrata NOT DETECTED NOT DETECTED Final   Candida krusei NOT DETECTED NOT DETECTED Final   Candida parapsilosis NOT DETECTED NOT DETECTED Final   Candida tropicalis NOT DETECTED NOT DETECTED Final   Cryptococcus neoformans/gattii NOT DETECTED NOT DETECTED Final   Meth resistant mecA/C and MREJ DETECTED (A) NOT DETECTED Final    Comment: CRITICAL RESULT CALLED TO, READ BACK BY AND VERIFIED WITH: Carmie Chough on [id number] @0900  by SM Performed at [facility], [address], [place], [place] [address]   Surgical pcr screen     Status: Abnormal   Collection Time: 10/09/23  2:22 AM   Specimen: Nasal Mucosa; Nasal Swab  Result Value Ref Range Status   MRSA, PCR POSITIVE (A) NEGATIVE Final    Comment: RESULT CALLED TO, READ BACK BY AND VERIFIED WITH: P BROWN RN 10/09/2023 @ 0526 BY AB    Staphylococcus aureus POSITIVE (A) NEGATIVE Final    Comment: (NOTE) The Xpert SA Assay (FDA approved for NASAL specimens in patients 56 years of age and older), is one component of a comprehensive surveillance program. It is not intended to diagnose infection nor to guide or monitor treatment. Performed at [facility], [address] [place], [place] [address]   Aerobic/Anaerobic Culture w Gram Stain (surgical/deep wound)     Status: None    Collection Time: 10/09/23  1:29 PM   Specimen: Finger, Left; Tissue  Result Value Ref Range Status   Specimen Description TISSUE  Final   Special Requests LEFT FINGER ABSCESS  Final   Gram Stain   Final    NO WBC SEEN ABUNDANT GRAM POSITIVE COCCI ABUNDANT GRAM NEGATIVE RODS    Culture   Final    RARE ESCHERICHIA COLI RARE STREPTOCOCCUS ANGINOSIS FEW BACTEROIDES FRAGILIS BETA LACTAMASE POSITIVE Performed at [facility], [address] [place], [place] [address]    Report Status 10/13/2023 FINAL  Final   Organism ID, Bacteria ESCHERICHIA COLI  Final   Organism ID, Bacteria STREPTOCOCCUS ANGINOSIS  Final      Susceptibility   Escherichia coli - MIC*    AMPICILLIN  >=32 RESISTANT Resistant     CEFEPIME  <=0.12 SENSITIVE Sensitive     CEFTAZIDIME <=1 SENSITIVE Sensitive     CEFTRIAXONE  <=0.25 SENSITIVE Sensitive     CIPROFLOXACIN  <=0.25 SENSITIVE Sensitive     GENTAMICIN 8 INTERMEDIATE Intermediate     IMIPENEM <=0.25 SENSITIVE Sensitive     TRIMETH /SULFA  >=320 RESISTANT Resistant     AMPICILLIN /SULBACTAM >=32 RESISTANT Resistant     PIP/TAZO >=128 RESISTANT Resistant ug/mL    * RARE ESCHERICHIA COLI   Streptococcus anginosis - MIC*    PENICILLIN <=0.06 SENSITIVE Sensitive     CEFTRIAXONE  0.25 SENSITIVE Sensitive     ERYTHROMYCIN  <=0.12 SENSITIVE Sensitive     LEVOFLOXACIN  <=0.25 SENSITIVE Sensitive     VANCOMYCIN  0.5 SENSITIVE Sensitive     * RARE STREPTOCOCCUS ANGINOSIS  Culture, blood (Routine X 2) w Reflex to ID Panel  Status: None (Preliminary result)   Collection Time: 10/13/23  1:51 PM   Specimen: BLOOD RIGHT HAND  Result Value Ref Range Status   Specimen Description BLOOD RIGHT HAND  Final   Special Requests   Final    BOTTLES DRAWN AEROBIC AND ANAEROBIC Blood Culture results may not be optimal due to an inadequate volume of blood received in culture bottles   Culture   Final    NO GROWTH 2 DAYS Performed at [facility], [address]  [place], [place] [address]    Report Status PENDING  Incomplete  Culture, blood (Routine X 2) w Reflex to ID Panel     Status: None (Preliminary result)   Collection Time: 10/13/23  1:52 PM   Specimen: BLOOD RIGHT HAND  Result Value Ref Range Status   Specimen Description BLOOD RIGHT HAND  Final   Special Requests   Final    BOTTLES DRAWN AEROBIC AND ANAEROBIC Blood Culture adequate volume   Culture   Final    NO GROWTH 2 DAYS Performed at [facility], [address] [place], [place] [address]    Report Status PENDING  Incomplete         Radiology Studies: MR SACRUM SI JOINTS W WO CONTRAST Result Date: 10/15/2023 CLINICAL DATA:  Lower back pain. Infection or inflammation suspected. MRSA bacteremia. EXAM: MRI LUMBAR SPINE WITHOUT AND WITH CONTRAST TECHNIQUE: Multiplanar and multiecho pulse sequences of the lumbar spine were obtained without and with intravenous contrast. CONTRAST:  4mL GADAVIST  GADOBUTROL  1 MMOL/ML IV SOLN COMPARISON:  MRI right hip 10/13/2023, CT abdomen and pelvis 10/08/2023 FINDINGS: Despite efforts by the technologist and patient, mild-to-moderate motion artifact is present on today's exam and could not be eliminated. This reduces exam sensitivity and specificity. Bones/joints/cartilage: Within the limitations of motion artifact, abnormal sacroiliac joint fluid or subchondral erosion is seen. No marrow edema or cortical erosion is seen within the visualized portions of the sacrum, ischium, ilium, pubis, or proximal femurs. Ligaments: No ligament tear is seen. Muscles and tendons: There is mild to moderate fluid deep to the bilateral iliacus muscles. No cortical erosion or edema is seen within the adjacent iliac bones. No tendon tear is identified. There is again generalized edema within the bilateral gluteus minimus greater than gluteus medius and maximus muscles. Soft tissues: There is again diffuse mild to moderate wall thickening of the visualized rectum and sigmoid  colon. Trace pelvic ascites again noted, layering dependently. IMPRESSION: 1. Mild-to-moderate fluid deep to the bilateral iliacus muscles. No cortical erosion or edema is seen within the adjacent iliac bones. This is nonspecific and may represent mild inflammatory change. 2. No evidence of osteomyelitis or sacroiliitis. 3. Redemonstration of nonspecific generalized edema within the bilateral gluteus minimus greater than gluteus medius and maximus muscles. 4. Again diffuse mild to moderate wall thickening of the visualized rectum and sigmoid colon. This is nonspecific and again may be due to colitis. Electronically Signed   By: Bertina Broccoli M.D.   On: 10/15/2023 22:21   ECHO TEE Result Date: 10/15/2023    TRANSESOPHOGEAL ECHO REPORT   Patient Name:   CRUZE ZINGARO Date of Exam: 10/15/2023 Medical Rec #:  [id number]                        Height:       59.0 in Accession #:    [id number]  Weight:       103.6 lb Date of Birth:  1970-09-13                         BSA:          1.395 m Patient Age:    52 years                         BP:           103/70 mmHg Patient Gender: M                                HR:           82 bpm. Exam Location:  Inpatient Procedure: Transesophageal Echo, Color Doppler and Cardiac Doppler (Both            Spectral and Color Flow Doppler were utilized during procedure). Indications:     Endocarditis  History:         Patient has prior history of Echocardiogram examinations, most                  recent 10/12/2023. End stage renal disease. HIV. Hepatitis C.;                  Risk Factors:Diabetes.  Sonographer:     Dione Franks RDCS Referring Phys:  [id number] Leala Prince Diagnosing Phys: Archer Bear PROCEDURE: After discussion of the risks and benefits of a TEE, an informed consent was obtained from the patient. The transesophogeal probe was passed without difficulty through the esophogus of the patient. Imaged were obtained with the patient in a  left lateral decubitus position. Sedation performed by different physician. The patient was monitored while under deep sedation. Anesthestetic sedation was provided intravenously by Anesthesiology: 100mg  of Propofol , 50mg  of Lidocaine . The patient developed no complications during the procedure.  IMPRESSIONS  1. Left ventricular ejection fraction, by estimation, is 60 to 65%. The left ventricle has normal function. The left ventricle has no regional wall motion abnormalities. There is mild concentric left ventricular hypertrophy.  2. Right ventricular systolic function is normal. The right ventricular size is normal.  3. No left atrial/left atrial appendage thrombus was detected.  4. There is a 1.8 x 1.2 mass on the posterior right atrial wall, mass is partially mobile. This may be a vegetation from where the old HD catheter impinged on the RA wall (now removed and replaced). The new HD catheter has no vegetation and does not impinge on the mass. Cannot rule out tumor, etc. Think he will need repeat study after trial of antibiotic treatment to ensure resolution.  5. No PFO/ASD by color doppler.  6. The aortic valve is tricuspid. Aortic valve regurgitation is not visualized. No aortic stenosis is present. No aortic valve vegetation.  7. The mitral valve is normal in structure. No evidence of mitral valve regurgitation. No evidence of mitral stenosis. No mitral valve vegetation.  8. No tricuspid valve vegetation. FINDINGS  Left Ventricle: Left ventricular ejection fraction, by estimation, is 60 to 65%. The left ventricle has normal function. The left ventricle has no regional wall motion abnormalities. The left ventricular internal cavity size was normal in size. There is  mild concentric left ventricular hypertrophy. Right Ventricle: The right ventricular size is normal. No increase in right ventricular wall thickness. Right ventricular systolic function is normal.  Left Atrium: Left atrial size was normal in size. No  left atrial/left atrial appendage thrombus was detected. Right Atrium: Right atrial size was normal in size. Pericardium: There is no evidence of pericardial effusion. Mitral Valve: The mitral valve is normal in structure. No evidence of mitral valve regurgitation. No evidence of mitral valve stenosis. Tricuspid Valve: The tricuspid valve is normal in structure. Tricuspid valve regurgitation is not demonstrated. Aortic Valve: The aortic valve is tricuspid. Aortic valve regurgitation is not visualized. No aortic stenosis is present. Pulmonic Valve: The pulmonic valve was normal in structure. Pulmonic valve regurgitation is not visualized. Aorta: The aortic root and ascending aorta are structurally normal, with no evidence of dilitation. IAS/Shunts: No PFO/ASD by color doppler. Dalton [employer] Electronically signed by Archer Bear Signature Date/Time: 10/15/2023/8:26:38 PM    Final    EP STUDY Result Date: 10/15/2023 See surgical note for result.       Scheduled Meds:  bictegravir-emtricitabine -tenofovir  AF  1 tablet Oral Daily   Chlorhexidine  Gluconate Cloth  6 each Topical Q0600   gabapentin   100 mg Oral QHS   heparin   5,000 Units Subcutaneous Q8H   insulin  aspart  0-5 Units Subcutaneous QHS   insulin  aspart  0-9 Units Subcutaneous TID WC   insulin  aspart protamine - aspart  8 Units Subcutaneous BID WC   lidocaine   1 patch Transdermal Q24H   lidocaine  (PF)  10 mL Infiltration Once   midodrine   10 mg Oral TID   vancomycin  variable dose per unstable renal function (pharmacist dosing)   Does not apply See admin instructions   Continuous Infusions:  anticoagulant sodium citrate     vancomycin  500 mg (10/16/23 1059)     LOS: 8 days    Time spent: 50 minutes spent on 10/16/2023 caring for this patient face-to-face including chart review, ordering labs/tests, documenting, discussion with nursing staff, consultants, updating family and interview/physical exam    [facility]a [facility] [place], DO [facility] Available via Epic secure chat 7am-7pm After these hours, please refer to coverage provider listed on [email or web address] 10/16/2023, 12:22 PM

## 2023-10-16 NOTE — Progress Notes (Signed)
 PHARMACY CONSULT NOTE FOR:  OUTPATIENT  PARENTERAL ANTIBIOTIC THERAPY (OPAT)  Informational as the patient will receive antibiotics with hemodialysis outpatient  Indication: MRSA bacteremia/IE Regimen: Vancomycin  500 mg/HD-MWF End date: 11/24/23 (6 weeks from neg BCx 6/2)  IV antibiotic discharge orders are pended. To discharging provider:  please sign these orders via discharge navigator,  Select New Orders & click on the button choice - Manage This Unsigned Work.    Thank you for allowing pharmacy to be a part of this patient's care.  Garland Junk, PharmD, BCPS, BCIDP Infectious Diseases Clinical Pharmacist 10/16/2023 2:24 PM   **Pharmacist phone directory can now be found on [email or web address] ([email or web address]).  Listed under [facility].

## 2023-10-17 ENCOUNTER — Other Ambulatory Visit (HOSPITAL_COMMUNITY): Payer: Self-pay

## 2023-10-17 LAB — RENAL FUNCTION PANEL
Albumin: 2.5 g/dL — ABNORMAL LOW (ref 3.5–5.0)
Anion gap: 15 (ref 5–15)
BUN: 28 mg/dL — ABNORMAL HIGH (ref 6–20)
CO2: 23 mmol/L (ref 22–32)
Calcium: 9 mg/dL (ref 8.9–10.3)
Chloride: 95 mmol/L — ABNORMAL LOW (ref 98–111)
Creatinine, Ser: 6.05 mg/dL — ABNORMAL HIGH (ref 0.61–1.24)
GFR, Estimated: 10 mL/min — ABNORMAL LOW (ref >60)
Glucose, Bld: 207 mg/dL — ABNORMAL HIGH (ref 70–99)
Phosphorus: 6.3 mg/dL — ABNORMAL HIGH (ref 2.5–4.6)
Potassium: 4.5 mmol/L (ref 3.5–5.1)
Sodium: 133 mmol/L — ABNORMAL LOW (ref 135–145)

## 2023-10-17 LAB — GLUCOSE, CAPILLARY
Glucose-Capillary: 174 mg/dL — ABNORMAL HIGH (ref 70–99)
Glucose-Capillary: 200 mg/dL — ABNORMAL HIGH (ref 70–99)
Glucose-Capillary: 220 mg/dL — ABNORMAL HIGH (ref 70–99)
Glucose-Capillary: 169 mg/dL — ABNORMAL HIGH (ref 70–99)
Glucose-Capillary: 128 mg/dL — ABNORMAL HIGH (ref 70–99)
Glucose-Capillary: 118 mg/dL — ABNORMAL HIGH (ref 70–99)

## 2023-10-17 LAB — CBC
HCT: 31.7 % — ABNORMAL LOW (ref 39.0–52.0)
Hemoglobin: 10.2 g/dL — ABNORMAL LOW (ref 13.0–17.0)
MCH: 31.9 pg (ref 26.0–34.0)
MCHC: 32.2 g/dL (ref 30.0–36.0)
MCV: 99.1 fL (ref 80.0–100.0)
Platelets: 169 10*3/uL (ref 150–400)
RBC: 3.2 MIL/uL — ABNORMAL LOW (ref 4.22–5.81)
RDW: 15.4 % (ref 11.5–15.5)
WBC: 11.3 10*3/uL — ABNORMAL HIGH (ref 4.0–10.5)
nRBC: 0 % (ref 0.0–0.2)

## 2023-10-17 MED ORDER — GABAPENTIN 100 MG PO CAPS
100.0000 mg | ORAL_CAPSULE | Freq: Every day | ORAL | 0 refills | Status: AC
  Filled 2023-10-17: qty 30, 30d supply, fill #0

## 2023-10-17 MED ORDER — HEPARIN SODIUM (PORCINE) 1000 UNIT/ML IJ SOLN
INTRAMUSCULAR | Status: AC
  Filled 2023-10-17: qty 4

## 2023-10-17 MED ORDER — HEPARIN SODIUM (PORCINE) 1000 UNIT/ML IJ SOLN
3200.0000 [IU] | Freq: Once | INTRAMUSCULAR | Status: AC
  Administered 2023-10-17: 3200 [IU]

## 2023-10-17 MED ORDER — MIDODRINE HCL 5 MG PO TABS
ORAL_TABLET | ORAL | Status: AC
  Filled 2023-10-17: qty 1

## 2023-10-17 MED ORDER — HYDROCODONE-ACETAMINOPHEN 5-325 MG PO TABS
1.0000 | ORAL_TABLET | Freq: Four times a day (QID) | ORAL | 0 refills | Status: DC | PRN
Start: 2023-10-17 — End: 2024-03-03
  Filled 2023-10-17: qty 30, 8d supply, fill #0

## 2023-10-17 MED ORDER — INSULIN ASPART PROT & ASPART (70-30 MIX) 100 UNIT/ML ~~LOC~~ SUSP
10.0000 [IU] | Freq: Two times a day (BID) | SUBCUTANEOUS | Status: DC
  Administered 2023-10-17 – 2023-10-18 (×2): 10 [IU] via SUBCUTANEOUS

## 2023-10-17 NOTE — Progress Notes (Signed)
 OT Screen Note  Patient Details Name: Alvin Daniels MRN: [id number] DOB: 10-19-1970   Cancelled Treatment:    Reason Eval/Treat Not Completed: OT screened, no needs identified, will sign off Per PT, patient is back at his baseline with no acute OT needs. OT will sign off at this time. Please re-consult if further acute needs arise.   Mollie Anger E. Urijah Raynor, OTR/L Acute Rehabilitation Services [phone]   [name] 10/17/2023, 12:53 PM

## 2023-10-17 NOTE — Progress Notes (Addendum)
 Blood Glucose 66 meal and milk given 2237 Blood Glucose 72 80% of meal eaten 2310 Blood Glucose 174 0000

## 2023-10-17 NOTE — Progress Notes (Signed)
 [facility] Progress Note   Subjective:   Patient reportedly refused HD this AM, when we talked to him with interpreter he is agreeable.  Will try to get him on for second shift. Denies SOB. He is worried about ongoing leg pain, primary service is aware. Otherwise no concerns.   Objective Vitals:   10/16/23 2240 10/16/23 2322 10/17/23 0552 10/17/23 0830  BP: (!) 85/54 131/79 123/73 (!) 121/45  Pulse: 90  75 84  Resp:    15  Temp: 98.5 F (36.9 C)  98.3 F (36.8 C) 98.1 F (36.7 C)  TempSrc: Oral  Oral Oral  SpO2: 100%  100% 95%  Weight:      Height:       Physical Exam General: Alert male in NAD Heart: RRR, no murmurs, rubs or gallops Lungs: CTA bilaterally, respirations unlabored on RA Abdomen: Soft, non-distended, +BS Extremities: B/l BKA, no edema noted Dialysis Access:  [facility] accessed, appears to have small skin tear under edge of bandage but no bleeding/drainage from [facility] exit site  Additional Objective Labs: Basic Metabolic Panel: Recent Labs  Lab 10/15/23 1548 10/16/23 0607 10/17/23 0656  NA 135 132* 133*  K 4.2 4.2 4.5  CL 93* 95* 95*  CO2 21* 19* 23  GLUCOSE 177* 212* 207*  BUN 44* 51* 28*  CREATININE 7.24* 8.38* 6.05*  CALCIUM  8.6* 8.6* 9.0  PHOS 5.7* 6.8* 6.3*   Liver Function Tests: Recent Labs  Lab 10/12/23 0608 10/13/23 0646 10/14/23 0822 10/15/23 1548 10/16/23 0607 10/17/23 0656  AST 33 27 23  --   --   --   ALT 21 18 16   --   --   --   ALKPHOS 488* 432* 409*  --   --   --   BILITOT 1.4* 1.2 1.4*  --   --   --   PROT 6.9 6.8 7.3  --   --   --   ALBUMIN  2.2* 2.1* 2.2* 2.3* 2.2* 2.5*   No results for input(s): "LIPASE", "AMYLASE" in the last 168 hours. CBC: Recent Labs  Lab 10/13/23 0646 10/14/23 0822 10/15/23 1548 10/16/23 0607 10/17/23 0656  WBC 13.8* 14.3* 10.8* 10.7* 11.3*  HGB 11.1* 11.0* 10.7* 11.0* 10.2*  HCT 32.7* 32.9* 31.6* 32.9* 31.7*  MCV 96.7 95.6 98.1 97.1 99.1  PLT 91* 110* 129* 156 169   Blood  Culture    Component Value Date/Time   SDES BLOOD RIGHT HAND 10/13/2023 1352   SPECREQUEST  10/13/2023 1352    BOTTLES DRAWN AEROBIC AND ANAEROBIC Blood Culture adequate volume   CULT  10/13/2023 1352    NO GROWTH 4 DAYS Performed at [facility], [address] [place], [place] [address]    REPTSTATUS PENDING 10/13/2023 1352    Cardiac Enzymes: No results for input(s): "CKTOTAL", "CKMB", "CKMBINDEX", "TROPONINI" in the last 168 hours. CBG: Recent Labs  Lab 10/16/23 2318 10/17/23 0103 10/17/23 0547 10/17/23 0607 10/17/23 1121  GLUCAP 72 174* 220* 200* 169*   Iron  Studies: No results for input(s): "IRON ", "TIBC", "TRANSFERRIN", "FERRITIN" in the last 72 hours. @lablastinr3 @ Studies/Results: MR SACRUM SI JOINTS W WO CONTRAST Result Date: 10/15/2023 CLINICAL DATA:  Lower back pain. Infection or inflammation suspected. MRSA bacteremia. EXAM: MRI LUMBAR SPINE WITHOUT AND WITH CONTRAST TECHNIQUE: Multiplanar and multiecho pulse sequences of the lumbar spine were obtained without and with intravenous contrast. CONTRAST:  4mL GADAVIST  GADOBUTROL  1 MMOL/ML IV SOLN COMPARISON:  MRI right hip 10/13/2023, CT abdomen and pelvis 10/08/2023  FINDINGS: Despite efforts by the technologist and patient, mild-to-moderate motion artifact is present on today's exam and could not be eliminated. This reduces exam sensitivity and specificity. Bones/joints/cartilage: Within the limitations of motion artifact, abnormal sacroiliac joint fluid or subchondral erosion is seen. No marrow edema or cortical erosion is seen within the visualized portions of the sacrum, ischium, ilium, pubis, or proximal femurs. Ligaments: No ligament tear is seen. Muscles and tendons: There is mild to moderate fluid deep to the bilateral iliacus muscles. No cortical erosion or edema is seen within the adjacent iliac bones. No tendon tear is identified. There is again generalized edema within the bilateral gluteus minimus greater than  gluteus medius and maximus muscles. Soft tissues: There is again diffuse mild to moderate wall thickening of the visualized rectum and sigmoid colon. Trace pelvic ascites again noted, layering dependently. IMPRESSION: 1. Mild-to-moderate fluid deep to the bilateral iliacus muscles. No cortical erosion or edema is seen within the adjacent iliac bones. This is nonspecific and may represent mild inflammatory change. 2. No evidence of osteomyelitis or sacroiliitis. 3. Redemonstration of nonspecific generalized edema within the bilateral gluteus minimus greater than gluteus medius and maximus muscles. 4. Again diffuse mild to moderate wall thickening of the visualized rectum and sigmoid colon. This is nonspecific and again may be due to colitis. Electronically Signed   By: Bertina Broccoli M.D.   On: 10/15/2023 22:21   ECHO TEE Result Date: 10/15/2023    TRANSESOPHOGEAL ECHO REPORT   Patient Name:   LENNIE VASCO Date of Exam: 10/15/2023 Medical Rec #:  [id number]                        Height:       59.0 in Accession #:    [id number]                       Weight:       103.6 lb Date of Birth:  18-Oct-1970                         BSA:          1.395 m Patient Age:    53 years                         BP:           103/70 mmHg Patient Gender: M                                HR:           82 bpm. Exam Location:  Inpatient Procedure: Transesophageal Echo, Color Doppler and Cardiac Doppler (Both            Spectral and Color Flow Doppler were utilized during procedure). Indications:     Endocarditis  History:         Patient has prior history of Echocardiogram examinations, most                  recent 10/12/2023. End stage renal disease. HIV. Hepatitis C.;                  Risk Factors:Diabetes.  Sonographer:     Dione Franks RDCS Referring Phys:  [id number] Leala Prince Diagnosing Phys: Archer Bear PROCEDURE: After discussion of the risks and benefits  of a TEE, an informed consent was obtained from the  patient. The transesophogeal probe was passed without difficulty through the esophogus of the patient. Imaged were obtained with the patient in a left lateral decubitus position. Sedation performed by different physician. The patient was monitored while under deep sedation. Anesthestetic sedation was provided intravenously by Anesthesiology: 100mg  of Propofol , 50mg  of Lidocaine . The patient developed no complications during the procedure.  IMPRESSIONS  1. Left ventricular ejection fraction, by estimation, is 60 to 65%. The left ventricle has normal function. The left ventricle has no regional wall motion abnormalities. There is mild concentric left ventricular hypertrophy.  2. Right ventricular systolic function is normal. The right ventricular size is normal.  3. No left atrial/left atrial appendage thrombus was detected.  4. There is a 1.8 x 1.2 mass on the posterior right atrial wall, mass is partially mobile. This may be a vegetation from where the old HD catheter impinged on the RA wall (now removed and replaced). The new HD catheter has no vegetation and does not impinge on the mass. Cannot rule out tumor, etc. Think he will need repeat study after trial of antibiotic treatment to ensure resolution.  5. No PFO/ASD by color doppler.  6. The aortic valve is tricuspid. Aortic valve regurgitation is not visualized. No aortic stenosis is present. No aortic valve vegetation.  7. The mitral valve is normal in structure. No evidence of mitral valve regurgitation. No evidence of mitral stenosis. No mitral valve vegetation.  8. No tricuspid valve vegetation. FINDINGS  Left Ventricle: Left ventricular ejection fraction, by estimation, is 60 to 65%. The left ventricle has normal function. The left ventricle has no regional wall motion abnormalities. The left ventricular internal cavity size was normal in size. There is  mild concentric left ventricular hypertrophy. Right Ventricle: The right ventricular size is normal. No  increase in right ventricular wall thickness. Right ventricular systolic function is normal. Left Atrium: Left atrial size was normal in size. No left atrial/left atrial appendage thrombus was detected. Right Atrium: Right atrial size was normal in size. Pericardium: There is no evidence of pericardial effusion. Mitral Valve: The mitral valve is normal in structure. No evidence of mitral valve regurgitation. No evidence of mitral valve stenosis. Tricuspid Valve: The tricuspid valve is normal in structure. Tricuspid valve regurgitation is not demonstrated. Aortic Valve: The aortic valve is tricuspid. Aortic valve regurgitation is not visualized. No aortic stenosis is present. Pulmonic Valve: The pulmonic valve was normal in structure. Pulmonic valve regurgitation is not visualized. Aorta: The aortic root and ascending aorta are structurally normal, with no evidence of dilitation. IAS/Shunts: No PFO/ASD by color doppler. [employer] Electronically signed by Archer Bear Signature Date/Time: 10/15/2023/8:26:38 PM    Final    EP STUDY Result Date: 10/15/2023 See surgical note for result.  Medications:  vancomycin  Stopped (10/16/23 1227)   [START ON 10/20/2023] vancomycin       bictegravir-emtricitabine -tenofovir  AF  1 tablet Oral Daily   Chlorhexidine  Gluconate Cloth  6 each Topical Q0600   Chlorhexidine  Gluconate Cloth  6 each Topical Q0600   gabapentin   100 mg Oral QHS   heparin   5,000 Units Subcutaneous Q8H   heparin  sodium (porcine)  3,200 Units Intracatheter Once   insulin  aspart  0-5 Units Subcutaneous QHS   insulin  aspart  0-9 Units Subcutaneous TID WC   insulin  aspart protamine - aspart  10 Units Subcutaneous BID WC   lidocaine   1 patch Transdermal Q24H   lidocaine  (PF)  10 mL  Infiltration Once   midodrine   10 mg Oral TID    Dialysis Orders: [facility] MWF 3.5h  B400  45.5kg   TDC   Heparin  2000 Last OP HD 5/28, post wt 45.7kg Very small wt gains, always makes it to dry wt     Assessment/Plan: MRSA bacteremia.  IV Vancomycin  started. Had [facility] in place,  also w finger necrosis. ID consulted. R removed TDC for line holiday 6/1, replaced 6/3. Repeat Bcx negative so far  Finger infection. Osteo on imaging. 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  ESRD: on HD MWF.  Last HD 5/30. Off schedule due to line holiday, resume MWF schedule today, pt is now agreeable Chronic hypotension: BP's at OP HD as typically low in the 80's-90s or lower during the sessions.  On midodrine  with HD Volume Appears euvolemic, tolerating UF with midodrine  Anemia of esrd: Hgb 10-11, follow.  Secondary hyperparathyroidism: Calcium /Phos acceptable. Continue home meds.  HIV. On Bictarvy.   Ramona Burner, PA-C 10/17/2023, 12:07 PM  [facility] Pager: [phone]

## 2023-10-17 NOTE — Evaluation (Signed)
 Physical Therapy Evaluation and d/c  Patient Details Name: Alvin Daniels MRN: [id number] DOB: Oct 22, 1970 Today's Date: 10/17/2023  History of Present Illness  53 yo male presents to [facility] 5/28 with n/v/d x1 week, L and RLQ pain. 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 Also with gallbladder sludge, no acute cholecystitis. S/p HD cath removal 6/1, replacement 6/3. TEE 6/4 shows partially mobile mass on the posterior right atrial wall. MR R hip and SIJ shows edema, no acute osseous findings. PMH includes ESRD on HD MWF, hepC, MRSA, HIV, bilat BKA with prostheses, DMII.  Clinical Impression   Pt presents with R groin and SI area pain, otherwise pt endorses being at baseline level of strength, balance, and mobility. Pt ambulatory around the room at mod I level, no physical assist for any aspect of mobility. Pt states he has no equipment or PT needs at this time, PT to sign off.       If plan is discharge home, recommend the following:     Can travel by private vehicle        Equipment Recommendations None recommended by PT  Recommendations for Other Services       Functional Status Assessment Patient has not had a recent decline in their functional status     Precautions / Restrictions Precautions Precautions: Fall Recall of Precautions/Restrictions: Intact Precaution/Restrictions Comments: bilat BKA, bilat prostheses x13 years per pt report Restrictions Weight Bearing Restrictions Per Provider Order: No      Mobility  Bed Mobility Overal bed mobility: Needs Assistance Bed Mobility: Supine to Sit, Sit to Supine     Supine to sit: Modified independent (Device/Increase time) Sit to supine: Modified independent (Device/Increase time)        Transfers Overall transfer level: Modified independent Equipment used: Rolling walker (2 wheels)               General transfer comment: slow to rise, no physical assist or cuing needed     Ambulation/Gait Ambulation/Gait assistance: Modified independent (Device/Increase time) Gait Distance (Feet): 30 Feet Assistive device: Rolling walker (2 wheels) Gait Pattern/deviations: Step-through pattern, Decreased stride length, Trunk flexed, Antalgic Gait velocity: decr     General Gait Details: antalgic gait and forward flexed trunk due to R groin and SIJ area pain, otherwise mod I for use of RW  Stairs            Wheelchair Mobility     Tilt Bed    Modified Rankin (Stroke Patients Only)       Balance Overall balance assessment: Modified Independent                                           Pertinent Vitals/Pain Pain Assessment Pain Assessment: Faces Faces Pain Scale: Hurts little more Pain Location: R groin and SIJ area, TTP Pain Descriptors / Indicators: Sore, Discomfort Pain Intervention(s): Monitored during session, Limited activity within patient's tolerance, Repositioned    Home Living Family/patient expects to be discharged to:: Private residence Living Arrangements: Alone Available Help at Discharge: Family;Available PRN/intermittently;Friend(s) Type of Home: Apartment Home Access: Level entry       Home Layout: One level Home Equipment: Agricultural consultant (2 wheels);Shower seat;Wheelchair - manual      Prior Function Prior Level of Function : Independent/Modified Independent  Extremity/Trunk Assessment   Upper Extremity Assessment Upper Extremity Assessment: Defer to OT evaluation    Lower Extremity Assessment Lower Extremity Assessment: Overall WFL for tasks assessed (bilat BKA, full ROM, no active wounds noted on residual limbs)    Cervical / Trunk Assessment Cervical / Trunk Assessment: Normal  Communication   Communication Communication: Impaired Factors Affecting Communication: Non - English speaking, interpreter not available (interpreter on Lupe Salk [phone])    Cognition  Arousal: Alert Behavior During Therapy: WFL for tasks assessed/performed   PT - Cognitive impairments: No apparent impairments                         Following commands: Intact       Cueing Cueing Techniques: Verbal cues     General Comments General comments (skin integrity, edema, etc.): HR 84 bpm post-gait    Exercises     Assessment/Plan    PT Assessment Patient does not need any further PT services  PT Problem List         PT Treatment Interventions      PT Goals (Current goals can be found in the Care Plan section)  Acute Rehab PT Goals Patient Stated Goal: home PT Goal Formulation: All assessment and education complete, DC therapy Time For Goal Achievement: 10/17/23 Potential to Achieve Goals: Good    Frequency       Co-evaluation               AM-PAC PT "6 Clicks" Mobility  Outcome Measure Help needed turning from your back to your side while in a flat bed without using bedrails?: None Help needed moving from lying on your back to sitting on the side of a flat bed without using bedrails?: None Help needed moving to and from a bed to a chair (including a wheelchair)?: None Help needed standing up from a chair using your arms (e.g., wheelchair or bedside chair)?: None Help needed to walk in hospital room?: None Help needed climbing 3-5 steps with a railing? : A Little 6 Click Score: 23    End of Session   Activity Tolerance: Patient tolerated treatment well Patient left: in bed;with call bell/phone within reach;with bed alarm set Nurse Communication: Mobility status PT Visit Diagnosis: Other abnormalities of gait and mobility (R26.89)    Time: 9629-5284 PT Time Calculation (min) (ACUTE ONLY): 20 min   Charges:   PT Evaluation $PT Eval Low Complexity: 1 Low   PT General Charges $$ ACUTE PT VISIT: 1 Visit         Alvin Daniels, PT DPT Acute Rehabilitation Services Secure Chat Preferred  Office [phone]   Alvin Daniels 10/17/2023, 12:35 PM

## 2023-10-17 NOTE — Discharge Summary (Signed)
 Physician Discharge Summary  Alvin Daniels VWU:[id number] DOB: [birth year] DOA: 10/08/2023  PCP: [name], MD  Admit date: 10/08/2023 Discharge date: 10/17/2023  Admitted From: Home Disposition: Home  Recommendations for Outpatient Follow-up:  Follow up with PCP in 1-2 weeks Outpatient follow-up with orthopedics, Dr. [name] Outpatient follow-up with infectious disease as scheduled Continue vancomycin  with hemodialysis with stop date November 24, 2023 Will need repeat TEE following completion of antibiotics  Home Health: No needs identified by therapy Equipment/Devices: None  Discharge Condition: Stable CODE STATUS: Full code Diet recommendation: Renal/consistent carbohydrate diet with 1200 mL fluid restriction  History of present illness:  Alvin Daniels is a 53 y.o. Spanish-speaking male with past medical history significant for ESRD on HD MWF, type 2 diabetes mellitus poorly controlled, HIV, chronic hepatitis C, PAD s/p bilateral BKA who presented to [facility] ED on 10/08/2023 with complaints of nausea/vomiting, abdominal pain, fever.  Additionally complaining of low back, hip pain, swelling to his left fourth finger in which she is developed a wound over the last 4 weeks.   In the ED, temperature 98.1 F,  RR 14, HR 81, BP 96/56, SpO2 97% on room air.  Initial labs significant for WBC 12.2, Hgb 10.6, platelet 87, sodium 136, K+ 3.1, stable kidney function, albumin  2.7, AST/ALT 52/34, alk phos 261, bilirubin 1.4, normal lipase, INR and lactic acid, mag 2.1, troponin 53.  EKG shows sinus rhythm. Left hand x-ray shows soft tissue swelling with bony cortical destruction involving the fourth mid and distal phalanx consistent with osteomyelitis. CT A/P without contrast shows mild CBD dilation and hepatomegaly. RUQ U/S shows gallbladder sludge with mild gallbladder wall thickening but no evidence of acute cholecystitis and minimally increased common bile duct.   Sepsis protocol was activated and patient received IV NS 2 L x 2, IV vancomycin  and IV Zosyn . Hand surgery was consulted.  TRH consulted for admission for further evaluation and management of sepsis secondary to osteomyelitis left fourth finger.  Hospital course:  Severe sepsis, POA Patient presenting with worsening pain/edema to left fourth finger with wound.  Left hand x-ray with soft tissue swelling, bony cortical destruction fourth mid/distal phalanx consistent with osteomyelitis.  Also positive blood cultures with MRSA.   Osteomyelitis left fourth finger Patient with 1 month history of worsening left fourth finger pain/edema with development of wound.  Left hand x-ray with soft tissue swelling with bony cortical destruction involving the fourth mid/distal phalanx consistent with osteomyelitis.  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 [name]. Operative culture with E. coli, Streptococcus anginosus. Continues on antibiotics with vancomycin  per infectious disease Wash hand with warm, soapy water. Outpatient follow-up with orthopedic hand surgery   MRSA septicemia Concern for right posterior atrial wall vegetation/mass Suspect etiology likely secondary to HD catheter.  TDC removed for line holiday on 6/1, replaced on 6/3.  TTE with LVEF 60 to 65%, no LV RWMA, RV systolic function normal, catheter right atrium with semimobile densities concerning for possible vegetation, IVC normal in size.  TEE 6/4 with findings of a partial Lee mobile mass posterior right atrial wall, may be vegetation/infection from wire old HD catheter transiting impinged RA wall, new HD catheter with no vegetation and does not impinge on the vegetation, cannot rule out tumor.  Repeat blood cultures 10/13/2023 with no growth. ID plans to  continue vancomycin  with HD through November 24, 2023 with outpatient follow-up. Will need repeat TEE after trial of antibiotics to  ensure resolution per  cardiology.   Right hip pain MR right hip with generalized soft tissue edema throughout the pelvis with mild involvement of the gluteus musculature no focal fluid collections, no evidence of osteomyelitis or septic arthritis, no acute osseous findings.  MR SI joint with mild-moderate fluid deep to the bilateral iliac us  muscles, no cortical erosion or edema with the adjacent iliac bones, no evidence of osteomyelitis or sacroiliitis, nonspecific generalized edema within the bilateral gluteus minimus greater than gluteus medius and maximus muscles.    Chronic hypotension: Midodrine  10 mg p.o. 3 times daily   Elevated alk phos Gallbladder sludge: ALP 261, mildly elevated AST to 62 and bilirubin of 1.4. RUQ U/S shows some gallbladder sludge but no acute cholecystitis, CBD minimally increased from 2021. Denies RUQ pain.      ESRD on HD MWF Anemia of chronic disease Mild hypokalemia Metabolic bone disease: TDC removed on 6/1, replaced on 6/3. Nephrology followed for continued HD while inpatient.    Type 2 diabetes with hyperglycemia: Hemoglobin A1c 10.0% on 07/17/2023.  PTA on NovoLog  70/30 at 15 units bid   HIV: Last CD4 count 315, viral load 27 on 04/16/23 now < 20, CD4 254 from 315.  Continue Biktarvy  per ID   Thrombocytopenia, chronic: Plt 87>>53>91>110, stable   Hx Bilateral BKA: Cont b/l prosthetics seen by PT with no recommendations/needs on discharge.  Discharge Diagnoses:  Principal Problem:   Sepsis ([name]) Active Problems:   Hypokalemia   Osteomyelitis of left hand ([name])   Biliary sludge determined by ultrasound   Acute bacterial endocarditis    Discharge Instructions  Discharge Instructions     Call MD for:  difficulty breathing, headache or visual disturbances   Complete by: As directed    Call MD for:  extreme fatigue   Complete by: As directed    Call MD for:  persistant dizziness or light-headedness   Complete by: As directed    Call MD  for:  persistant nausea and vomiting   Complete by: As directed    Call MD for:  severe uncontrolled pain   Complete by: As directed    Call MD for:  temperature >100.4   Complete by: As directed    Diet - low sodium heart healthy   Complete by: As directed    Home infusion instructions   Complete by: As directed    To be given at the patient's HD center   Instructions: Flushing of vascular access device: 0.9% NaCl pre/post medication administration and prn patency; Heparin  100 u/ml, 5ml for implanted ports and Heparin  10u/ml, 5ml for all other central venous catheters.   Increase activity slowly   Complete by: As directed    No wound care   Complete by: As directed       Allergies as of 10/17/2023   No Known Allergies      Medication List     TAKE these medications    Biktarvy  50-200-25 MG Tabs tablet Generic drug: bictegravir-emtricitabine -tenofovir  AF Take 1 tablet by mouth daily.   gabapentin  100 MG capsule Commonly known as: NEURONTIN  Take 1 capsule (100 mg total) by mouth at bedtime.   HYDROcodone -acetaminophen  5-325 MG tablet Commonly known as: NORCO/VICODIN Take 1 tablet by mouth every 6 (six) hours as needed for severe pain (pain score 7-10) or moderate pain (pain score 4-6).   midodrine  10 MG tablet Commonly known as: PROAMATINE  Take 1 tablet (10 mg total) by mouth 3 (three) times daily as needed for low blood pressure, SBP</=100. may  take 30 min prior to dialysis and mid treatment as needed.   NovoLOG  70/30 FlexPen (70-30) 100 UNIT/ML FlexPen Generic drug: insulin  aspart protamine  - aspart Inject 20 Units into the skin 2 (two) times daily. What changed: how much to take   vancomycin  IVPB Inject 500 mg into the vein every Monday, Wednesday, and Friday with hemodialysis. Indication:  MRSA endocarditis Last Day of Therapy:  11/24/23 Labs - Once Weekly:  CBC/D, BMP, and vancomycin  trough, ESR and CRP Method of administration: Per hemodialysis protocol - to be  given at the HD center               Home Infusion Instuctions  (From admission, onward)           Start     Ordered   10/16/23 0000  Home infusion instructions       Comments: To be given at the patient's HD center  Question:  Instructions  Answer:  Flushing of vascular access device: 0.9% NaCl pre/post medication administration and prn patency; Heparin  100 u/ml, 5ml for implanted ports and Heparin  10u/ml, 5ml for all other central venous catheters.   10/16/23 1424            Follow-up Information     [name], MD. Schedule an appointment as soon as possible for a visit in 1 week(s).   Specialty: Family Medicine Contact information: [address] [place] [address] [phone]         Alvin Shropshire, MD. Schedule an appointment as soon as possible for a visit in 2 week(s).   Specialty: Orthopedic Surgery Contact information: [address] [place]  [address] [phone]         Alvin Redman, MD. Schedule an appointment as soon as possible for a visit.   Specialty: Infectious Diseases Contact information: [address] [place] [address] [phone]                No Known Allergies  Consultations: Infectious disease Orthopedic hand surgery, Dr. Kerby Pearson Interventional radiology Nephrology   Procedures/Studies: MR SACRUM SI JOINTS W WO CONTRAST Result Date: 10/15/2023 CLINICAL DATA:  Lower back pain. Infection or inflammation suspected. MRSA bacteremia. EXAM: MRI LUMBAR SPINE WITHOUT AND WITH CONTRAST TECHNIQUE: Multiplanar and multiecho pulse sequences of the lumbar spine were obtained without and with intravenous contrast. CONTRAST:  4mL GADAVIST  GADOBUTROL  1 MMOL/ML IV SOLN COMPARISON:  MRI right hip 10/13/2023, CT abdomen and pelvis 10/08/2023 FINDINGS: Despite efforts by the technologist and patient, mild-to-moderate motion artifact is present on today's exam and could  not be eliminated. This reduces exam sensitivity and specificity. Bones/joints/cartilage: Within the limitations of motion artifact, abnormal sacroiliac joint fluid or subchondral erosion is seen. No marrow edema or cortical erosion is seen within the visualized portions of the sacrum, ischium, ilium, pubis, or proximal femurs. Ligaments: No ligament tear is seen. Muscles and tendons: There is mild to moderate fluid deep to the bilateral iliacus muscles. No cortical erosion or edema is seen within the adjacent iliac bones. No tendon tear is identified. There is again generalized edema within the bilateral gluteus minimus greater than gluteus medius and maximus muscles. Soft tissues: There is again diffuse mild to moderate wall thickening of the visualized rectum and sigmoid colon. Trace pelvic ascites again noted, layering dependently. IMPRESSION: 1. Mild-to-moderate fluid deep to the bilateral iliacus muscles. No cortical erosion or edema is seen within the adjacent iliac bones. This is nonspecific and may represent mild  inflammatory change. 2. No evidence of osteomyelitis or sacroiliitis. 3. Redemonstration of nonspecific generalized edema within the bilateral gluteus minimus greater than gluteus medius and maximus muscles. 4. Again diffuse mild to moderate wall thickening of the visualized rectum and sigmoid colon. This is nonspecific and again may be due to colitis. Electronically Signed   By: Bertina Broccoli M.D.   On: 10/15/2023 22:21   ECHO TEE Result Date: 10/15/2023    TRANSESOPHOGEAL ECHO REPORT   Patient Name:   Alvin Daniels Date of Exam: 10/15/2023 Medical Rec #:  [id number]                        Height:       59.0 in Accession #:    [id number]                       Weight:       103.6 lb Date of Birth:  [birth year]                         BSA:          1.395 m Patient Age:    52 years                         BP:           103/70 mmHg Patient Gender: M                                HR:            82 bpm. Exam Location:  Inpatient Procedure: Transesophageal Echo, Color Doppler and Cardiac Doppler (Both            Spectral and Color Flow Doppler were utilized during procedure). Indications:     Endocarditis  History:         Patient has prior history of Echocardiogram examinations, most                  recent 10/12/2023. End stage renal disease. HIV. Hepatitis C.;                  Risk Factors:Diabetes.  Sonographer:     Dione Franks RDCS Referring Phys:  [id number] Leala Prince Diagnosing Phys: Alvin Daniels PROCEDURE: After discussion of the risks and benefits of a TEE, an informed consent was obtained from the patient. The transesophogeal probe was passed without difficulty through the esophogus of the patient. Imaged were obtained with the patient in a left lateral decubitus position. Sedation performed by different physician. The patient was monitored while under deep sedation. Anesthestetic sedation was provided intravenously by Anesthesiology: 100mg  of Propofol , 50mg  of Lidocaine . The patient developed no complications during the procedure.  IMPRESSIONS  1. Left ventricular ejection fraction, by estimation, is 60 to 65%. The left ventricle has normal function. The left ventricle has no regional wall motion abnormalities. There is mild concentric left ventricular hypertrophy.  2. Right ventricular systolic function is normal. The right ventricular size is normal.  3. No left atrial/left atrial appendage thrombus was detected.  4. There is a 1.8 x 1.2 mass on the posterior right atrial wall, mass is partially mobile. This may be a vegetation from where the old HD catheter impinged on the RA wall (now removed and replaced). The new HD catheter  has no vegetation and does not impinge on the mass. Cannot rule out tumor, etc. Think he will need repeat study after trial of antibiotic treatment to ensure resolution.  5. No PFO/ASD by color doppler.  6. The aortic valve is tricuspid. Aortic valve  regurgitation is not visualized. No aortic stenosis is present. No aortic valve vegetation.  7. The mitral valve is normal in structure. No evidence of mitral valve regurgitation. No evidence of mitral stenosis. No mitral valve vegetation.  8. No tricuspid valve vegetation. FINDINGS  Left Ventricle: Left ventricular ejection fraction, by estimation, is 60 to 65%. The left ventricle has normal function. The left ventricle has no regional wall motion abnormalities. The left ventricular internal cavity size was normal in size. There is  mild concentric left ventricular hypertrophy. Right Ventricle: The right ventricular size is normal. No increase in right ventricular wall thickness. Right ventricular systolic function is normal. Left Atrium: Left atrial size was normal in size. No left atrial/left atrial appendage thrombus was detected. Right Atrium: Right atrial size was normal in size. Pericardium: There is no evidence of pericardial effusion. Mitral Valve: The mitral valve is normal in structure. No evidence of mitral valve regurgitation. No evidence of mitral valve stenosis. Tricuspid Valve: The tricuspid valve is normal in structure. Tricuspid valve regurgitation is not demonstrated. Aortic Valve: The aortic valve is tricuspid. Aortic valve regurgitation is not visualized. No aortic stenosis is present. Pulmonic Valve: The pulmonic valve was normal in structure. Pulmonic valve regurgitation is not visualized. Aorta: The aortic root and ascending aorta are structurally normal, with no evidence of dilitation. IAS/Shunts: No PFO/ASD by color doppler. Alvin Daniels Electronically signed by Alvin Daniels Signature Date/Time: 10/15/2023/8:26:38 PM    Final    EP STUDY Result Date: 10/15/2023 See surgical note for result.  IR Fluoro Guide CV Line Right Result Date: 10/14/2023 INDICATION: 53 year old male with history of end-stage renal disease on hemodialysis require replacement of tunneled hemodialysis catheter  after line holiday for bacteremia. EXAM: TUNNELED CENTRAL VENOUS HEMODIALYSIS CATHETER PLACEMENT WITH ULTRASOUND AND FLUOROSCOPIC GUIDANCE MEDICATIONS: Ancef  2 gm IV . The antibiotic was given in an appropriate time interval prior to skin puncture. ANESTHESIA/SEDATION: Moderate (conscious) sedation was employed during this procedure. A total of Versed  1 mg and Fentanyl  50 mcg was administered intravenously. Moderate Sedation Time: 12 minutes. The patient's level of consciousness and vital signs were monitored continuously by radiology nursing throughout the procedure under my direct supervision. FLUOROSCOPY TIME:  Four mGy, reference air kerma COMPLICATIONS: None immediate. PROCEDURE: Informed written consent was obtained from the patient after a discussion of the risks, benefits, and alternatives to treatment. Questions regarding the procedure were encouraged and answered. The right neck and chest were prepped with chlorhexidine  in a sterile fashion, and a sterile drape was applied covering the operative field. Maximum barrier sterile technique with sterile gowns and gloves were used for the procedure. A timeout was performed prior to the initiation of the procedure. After creating a small venotomy incision, a 21 gauge micropuncture kit was utilized to access the internal jugular vein. Real-time ultrasound guidance was utilized for vascular access including the acquisition of a permanent ultrasound image documenting patency of the accessed vessel. A Rosen wire was advanced to the level of the IVC and the micropuncture sheath was exchanged for an 8 Fr dilator. A 14.5 French tunneled hemodialysis catheter measuring 19 cm from tip to cuff was tunneled in a retrograde fashion from the anterior chest wall to the venotomy incision. Serial  dilation was then performed an a peel-away sheath was placed. The catheter was then placed through the peel-away sheath with the catheter tip ultimately positioned within the right  atrium. Final catheter positioning was confirmed and documented with a spot radiographic image. The catheter aspirates and flushes normally. The catheter was flushed with appropriate volume heparin  dwells. The catheter exit site was secured with a 0-Silk retention suture. The venotomy incision was closed with Dermabond. Sterile dressings were applied. The patient tolerated the procedure well without immediate post procedural complication. IMPRESSION: Successful placement of 19 cm tip to cuff tunneled hemodialysis catheter via the right internal jugular vein with catheter tip terminating within the right atrium. The catheter is ready for immediate use. Creasie Doctor, MD [facility] Radiology Electronically Signed   By: Creasie Doctor M.D.   On: 10/14/2023 14:52   IR US  Guide Vasc Access Right Result Date: 10/14/2023 INDICATION: 53 year old male with history of end-stage renal disease on hemodialysis require replacement of tunneled hemodialysis catheter after line holiday for bacteremia. EXAM: TUNNELED CENTRAL VENOUS HEMODIALYSIS CATHETER PLACEMENT WITH ULTRASOUND AND FLUOROSCOPIC GUIDANCE MEDICATIONS: Ancef  2 gm IV . The antibiotic was given in an appropriate time interval prior to skin puncture. ANESTHESIA/SEDATION: Moderate (conscious) sedation was employed during this procedure. A total of Versed  1 mg and Fentanyl  50 mcg was administered intravenously. Moderate Sedation Time: 12 minutes. The patient's level of consciousness and vital signs were monitored continuously by radiology nursing throughout the procedure under my direct supervision. FLUOROSCOPY TIME:  Four mGy, reference air kerma COMPLICATIONS: None immediate. PROCEDURE: Informed written consent was obtained from the patient after a discussion of the risks, benefits, and alternatives to treatment. Questions regarding the procedure were encouraged and answered. The right neck and chest were prepped with  chlorhexidine  in a sterile fashion, and a sterile drape was applied covering the operative field. Maximum barrier sterile technique with sterile gowns and gloves were used for the procedure. A timeout was performed prior to the initiation of the procedure. After creating a small venotomy incision, a 21 gauge micropuncture kit was utilized to access the internal jugular vein. Real-time ultrasound guidance was utilized for vascular access including the acquisition of a permanent ultrasound image documenting patency of the accessed vessel. A Rosen wire was advanced to the level of the IVC and the micropuncture sheath was exchanged for an 8 Fr dilator. A 14.5 French tunneled hemodialysis catheter measuring 19 cm from tip to cuff was tunneled in a retrograde fashion from the anterior chest wall to the venotomy incision. Serial dilation was then performed an a peel-away sheath was placed. The catheter was then placed through the peel-away sheath with the catheter tip ultimately positioned within the right atrium. Final catheter positioning was confirmed and documented with a spot radiographic image. The catheter aspirates and flushes normally. The catheter was flushed with appropriate volume heparin  dwells. The catheter exit site was secured with a 0-Silk retention suture. The venotomy incision was closed with Dermabond. Sterile dressings were applied. The patient tolerated the procedure well without immediate post procedural complication. IMPRESSION: Successful placement of 19 cm tip to cuff tunneled hemodialysis catheter via the right internal jugular vein with catheter tip terminating within the right atrium. The catheter is ready for immediate use. Creasie Doctor, MD [facility] Radiology Electronically Signed   By: Creasie Doctor M.D.   On: 10/14/2023 14:52   MR HIP RIGHT WO CONTRAST Result Date: 10/14/2023 CLINICAL DATA:  Soft tissue  infection suspected, right hip.  MRSA bacteremia. EXAM: MR OF THE RIGHT HIP WITHOUT CONTRAST TECHNIQUE: Multiplanar, multisequence MR imaging was performed. No intravenous contrast was administered. COMPARISON:  Lumbar MRI 10/10/2023, abdominopelvic CT 10/08/2023 and radiographs of the right hip 12/01/2019. FINDINGS: Technical note: Despite efforts by the technologist and patient, mild motion artifact is present on today's exam and could not be eliminated. This reduces exam sensitivity and specificity. Bones: There is no evidence of acute fracture, dislocation or femoral head osteonecrosis. No evidence of osteomyelitis. There is an intramedullary nail within the left femoral diaphysis. The visualized sacroiliac joints and symphysis pubis appear normal. Articular cartilage and labrum Articular cartilage: No focal chondral defect or subchondral signal abnormality identified. Labrum: There is no gross labral tear or paralabral abnormality. Joint or bursal effusion Joint effusion: No significant hip joint effusion. Bursae: No focal periarticular fluid collection. Muscles and tendons Muscles and tendons: Generalized soft tissue edema throughout the pelvis with mild involvement of the gluteus musculature bilaterally. No focal fluid collections are identified. There is chronic atrophy of the right piriformis muscle. The gluteus, common hamstring and iliopsoas tendons are intact. Other findings Miscellaneous: Generalized subcutaneous edema throughout the pelvis without focal fluid collection. There is also generalized soft tissue edema throughout the pelvis with a small amount of pelvic ascites. The distal colon is fluid-filled with mild wall thickening. IMPRESSION: 1. Nonspecific generalized soft tissue edema throughout the pelvis with mild involvement of the gluteus musculature bilaterally. No focal fluid collections are identified. 2. No evidence of osteomyelitis or septic arthritis. 3. No acute osseous findings. 4. Fluid-filled distal colon with mild  wall thickening, possibly reflecting colitis. Electronically Signed   By: Elmon Hagedorn M.D.   On: 10/14/2023 10:21   IR Removal Tun Cv Cath W/O FL Result Date: 10/12/2023 INDICATION: Patient with a history of end-stage renal disease receiving hemodialysis via a right IJ tunnel dialysis catheter placed by nephrology September 01, 2023. Patient now with bacteremia. Interventional radiology asked to remove line for a line holiday. EXAM: REMOVAL TUNNELED CENTRAL VENOUS CATHETER MEDICATIONS: 1% lidocaine  8 mL ANESTHESIA/SEDATION: None FLUOROSCOPY: None COMPLICATIONS: None immediate. PROCEDURE: Informed written consent was obtained from the patient after a thorough discussion of the procedural risks, benefits and alternatives. All questions were addressed. Maximal Sterile Barrier Technique was utilized including caps, mask, sterile gowns, sterile gloves, sterile drape, hand hygiene and skin antiseptic. A timeout was performed prior to the initiation of the procedure. The patient's right chest and catheter was prepped and draped in a normal sterile fashion. Heparin  was removed from both ports of catheter. 1% lidocaine  was used for local anesthesia. Using gentle blunt dissection and mild manual traction the cuff of the catheter was exposed and the catheter was removed in it's entirety. Pressure was held till hemostasis was obtained. A sterile dressing was applied. The patient tolerated the procedure well with no immediate complications. IMPRESSION: Successful catheter removal as described above. Procedure performed by Jetta Morrow, NP Electronically Signed   By: Elene Griffes M.D.   On: 10/12/2023 19:05   ECHOCARDIOGRAM COMPLETE Result Date: 10/12/2023    ECHOCARDIOGRAM REPORT   Patient Name:   Alvin Daniels Date of Exam: 10/12/2023 Medical Rec #:  [id number]                        Height:       59.0 in Accession #:    [id number]  Weight:       104.7 lb Date of Birth:  1971-03-19                          BSA:          1.401 m Patient Age:    52 years                         BP:           106/68 mmHg Patient Gender: M                                HR:           76 bpm. Exam Location:  Inpatient Procedure: 2D Echo, Cardiac Doppler and Color Doppler (Both Spectral and Color            Flow Doppler were utilized during procedure). Indications:    Endocarditis  History:        Patient has prior history of Echocardiogram examinations, most                 recent 02/29/2020.  Sonographer:    Andrena Bang Referring Phys: [id number] Alvin Daniels IMPRESSIONS  1. Left ventricular ejection fraction, by estimation, is 60 to 65%. The left ventricle has normal function. The left ventricle has no regional wall motion abnormalities. Left ventricular diastolic parameters are indeterminate.  2. Right ventricular systolic function is normal. The right ventricular size is normal. Tricuspid regurgitation signal is inadequate for assessing PA pressure.  3. Catheter noted in the right atrium with semi mobile densities attached concerning for possible vegetations.  4. The mitral valve is normal in structure. No evidence of mitral valve regurgitation. No evidence of mitral stenosis.  5. The aortic valve is tricuspid. Aortic valve regurgitation is not visualized. No aortic stenosis is present.  6. The inferior vena cava is normal in size with greater than 50% respiratory variability, suggesting right atrial pressure of 3 mmHg. FINDINGS  Left Ventricle: Left ventricular ejection fraction, by estimation, is 60 to 65%. The left ventricle has normal function. The left ventricle has no regional wall motion abnormalities. The left ventricular internal cavity size was normal in size. There is  no left ventricular hypertrophy. Left ventricular diastolic parameters are indeterminate. Right Ventricle: The right ventricular size is normal. Right vetricular wall thickness was not well visualized. Right ventricular systolic function is normal.  Tricuspid regurgitation signal is inadequate for assessing PA pressure. Left Atrium: Left atrial size was normal in size. Right Atrium: Catheter noted in the right atrium with semi mobile densities attached concerning for possible vegetations. Right atrial size was normal in size. Pericardium: Incidental finding of hepatic cyst. There is no evidence of pericardial effusion. Mitral Valve: The mitral valve is normal in structure. No evidence of mitral valve regurgitation. No evidence of mitral valve stenosis. Tricuspid Valve: The tricuspid valve is normal in structure. Tricuspid valve regurgitation is not demonstrated. No evidence of tricuspid stenosis. Aortic Valve: The aortic valve is tricuspid. Aortic valve regurgitation is not visualized. No aortic stenosis is present. Aortic valve mean gradient measures 3.0 mmHg. Aortic valve peak gradient measures 5.4 mmHg. Aortic valve area, by VTI measures 2.54 cm. Pulmonic Valve: The pulmonic valve was not well visualized. Pulmonic valve regurgitation is not visualized. No evidence of pulmonic stenosis. Aorta: The aortic root and ascending  aorta are structurally normal, with no evidence of dilitation. Venous: The inferior vena cava is normal in size with greater than 50% respiratory variability, suggesting right atrial pressure of 3 mmHg. IAS/Shunts: No atrial level shunt detected by color flow Doppler.  LEFT VENTRICLE PLAX 2D LVIDd:         3.57 cm     Diastology LVIDs:         2.45 cm     LV e' medial:    5.00 cm/s LV PW:         1.00 cm     LV E/e' medial:  15.5 LV IVS:        1.03 cm     LV e' lateral:   8.16 cm/s LVOT diam:     2.00 cm     LV E/e' lateral: 9.5 LV SV:         55 LV SV Index:   39 LVOT Area:     3.14 cm  LV Volumes (MOD) LV vol d, MOD A2C: 55.3 ml LV vol d, MOD A4C: 52.7 ml LV vol s, MOD A2C: 16.9 ml LV vol s, MOD A4C: 22.1 ml LV SV MOD A2C:     38.4 ml LV SV MOD A4C:     52.7 ml LV SV MOD BP:      34.7 ml RIGHT VENTRICLE RV S prime:     11.50 cm/s TAPSE  (M-mode): 1.8 cm LEFT ATRIUM           Index LA diam:      2.45 cm 1.75 cm/m LA Vol (A2C): 25.1 ml 17.92 ml/m LA Vol (A4C): 17.2 ml 12.28 ml/m  AORTIC VALVE AV Area (Vmax):    2.64 cm AV Area (Vmean):   2.36 cm AV Area (VTI):     2.54 cm AV Vmax:           116.00 cm/s AV Vmean:          72.500 cm/s AV VTI:            0.218 m AV Peak Grad:      5.4 mmHg AV Mean Grad:      3.0 mmHg LVOT Vmax:         97.40 cm/s LVOT Vmean:        54.400 cm/s LVOT VTI:          0.176 m LVOT/AV VTI ratio: 0.81  AORTA Ao Root diam: 3.10 cm Ao Asc diam:  3.40 cm MITRAL VALVE MV Area (PHT): 2.69 cm    SHUNTS MV Decel Time: 282 msec    Systemic VTI:  0.18 m MV E velocity: 77.40 cm/s  Systemic Diam: 2.00 cm MV A velocity: 69.80 cm/s MV E/A ratio:  1.11 Armida Lander MD Electronically signed by Armida Lander MD Signature Date/Time: 10/12/2023/12:38:19 PM    Final    MR Shoulder Left W Wo Contrast Result Date: 10/10/2023 CLINICAL DATA:  MRSA bacteremia left shoulder tenderness EXAM: MRI OF THE LEFT SHOULDER WITHOUT AND WITH CONTRAST TECHNIQUE: Multiplanar, multisequence MR imaging of the left shoulder was performed before and after the administration of intravenous contrast. CONTRAST:  5mL GADAVIST  GADOBUTROL  1 MMOL/ML IV SOLN COMPARISON:  01/23/2017 radiographs FINDINGS: Rotator cuff:  A mild supraspinatus tendinopathy. Muscles:  Unremarkable Biceps long head:  Unremarkable Acromioclavicular Joint: No significant arthropathy. Type II acromion. Trace subacromial subdeltoid bursitis. Glenohumeral Joint: Mild degenerative chondral thinning. No joint effusion or substantial synovitis. Labrum:  Grossly unremarkable Bones: No significant extra-articular osseous  abnormalities identified. Other: No supplemental non-categorized findings. IMPRESSION: 1. No specific findings of septic arthritis or osteomyelitis. 2. Mild supraspinatus tendinopathy. 3. Trace subacromial subdeltoid bursitis. 4. Mild degenerative chondral thinning in the  glenohumeral joint. Electronically Signed   By: Freida Jes M.D.   On: 10/10/2023 11:51   MR LUMBAR SPINE WO CONTRAST Result Date: 10/10/2023 CLINICAL DATA:  MRSA bacteremia and lumbar spine tenderness EXAM: MRI LUMBAR SPINE WITHOUT CONTRAST TECHNIQUE: Multiplanar, multisequence MR imaging of the lumbar spine was performed. No intravenous contrast was administered. COMPARISON:  None Available. FINDINGS: Segmentation:  Standard Alignment:  Normal Vertebrae:  No fracture, evidence of discitis, or bone lesion. Conus medullaris and cauda equina: Conus extends to the L1 level. Conus and cauda equina appear normal. Paraspinal and other soft tissues: Negative Disc levels: L1-L2: Normal disc space and facet joints. No spinal canal stenosis. No neural foraminal stenosis. L2-L3: Normal disc space and facet joints. No spinal canal stenosis. No neural foraminal stenosis. L3-L4: Normal disc space and facet joints. No spinal canal stenosis. No neural foraminal stenosis. L4-L5: Normal disc space and facet joints. No spinal canal stenosis. No neural foraminal stenosis. L5-S1: Normal disc space and facet joints. No spinal canal stenosis. No neural foraminal stenosis. Visualized sacrum: Normal. IMPRESSION: Normal MRI of the lumbar spine. Electronically Signed   By: Juanetta Nordmann M.D.   On: 10/10/2023 02:38   DG Lumbar Spine 2-3 Views Result Date: 10/09/2023 CLINICAL DATA:  Low back pain radiating into the legs. EXAM: LUMBAR SPINE - 3 VIEW COMPARISON:  None Available. FINDINGS: Five lumbar type vertebral bodies are well visualized. Vertebral body height is well maintained. No anterolisthesis is seen. Extensive arterial calcifications are noted. No acute soft tissue abnormality is seen. IMPRESSION: No acute abnormality noted. Electronically Signed   By: Violeta Grey M.D.   On: 10/09/2023 09:40   DG CHEST PORT 1 VIEW Result Date: 10/09/2023 CLINICAL DATA:  Dyspnea. EXAM: PORTABLE CHEST 1 VIEW COMPARISON:  08/01/2022  FINDINGS: The cardiopericardial silhouette is within normal limits for size. New patchy/nodular densities are seen clustered in the right lower lung. Stable streaky opacity at the left base consistent with chronic atelectasis or scarring. No pulmonary edema or pleural effusion. Right IJ central line tip overlies the right atrium. Telemetry leads overlie the chest. IMPRESSION: New patchy/nodular densities clustered in the right lower lung. Imaging features are nonspecific and could be infectious/inflammatory. Follow-up chest radiograph recommended to ensure resolution. Electronically Signed   By: Donnal Fusi M.D.   On: 10/09/2023 08:05   US  Abdomen Limited RUQ (LIVER/GB) Result Date: 10/08/2023 CLINICAL DATA:  Epigastric pain. EXAM: ULTRASOUND ABDOMEN LIMITED RIGHT UPPER QUADRANT COMPARISON:  10/08/2023, 09/28/2019 at. FINDINGS: Gallbladder: Echogenic material is present in the dependent portion of the gallbladder. There is mild gallbladder wall thickening at 4.6 mm. No sonographic Murphy sign noted by sonographer. Common bile duct: Diameter: 14.8 mm Liver: No focal lesion identified. Within normal limits in parenchymal echogenicity. Portal vein is patent on color Doppler imaging with normal direction of blood flow towards the liver. Other: Shadowing calcifications in the right kidney are noted, compatible with no vascular calcifications. Increased renal parenchymal echogenicity is noted. IMPRESSION: 1. Echogenic material in the gallbladder, possible sludge with mild gallbladder wall thickening. No sonographic Abigail Abler sign is see to suggest acute cholecystitis. 2. Distended common bile duct measuring 14.8 mm, minimally increased from 2021 and may be chronic. 3. Increased renal echogenicity on the right, suggesting medical renal disease. Electronically Signed   By: Rice Chamorro  Carolynne Citron M.D.   On: 10/08/2023 20:41   CT ABDOMEN PELVIS WO CONTRAST Result Date: 10/08/2023 CLINICAL DATA:  LLQ abdominal pain RLQ abdominal  pain EXAM: CT ABDOMEN AND PELVIS WITHOUT CONTRAST TECHNIQUE: Multidetector CT imaging of the abdomen and pelvis was performed following the standard protocol without IV contrast. RADIATION DOSE REDUCTION: This exam was performed according to the departmental dose-optimization program which includes automated exposure control, adjustment of the mA and/or kV according to patient size and/or use of iterative reconstruction technique. COMPARISON:  CT abdomen pelvis 09/27/2019, ultrasound abdomen 09/28/2019 FINDINGS: Lower chest: No acute abnormality. Similar-appearing bilateral lower lobe atelectasis versus scarring. Hepatobiliary: Liver is enlarged measuring up to 20 cm. No focal liver abnormality. No gallstones, gallbladder wall thickening, or pericholecystic fluid. Common bile duct dilatation measuring up to 14 mm. Hydropic gallbladder. No definite intrahepatic biliary ductal dilatation. Pancreas: No focal lesion. Normal pancreatic contour. No surrounding inflammatory changes. No main pancreatic ductal dilatation. Spleen: Normal in size without focal abnormality. Adrenals/Urinary Tract: No adrenal nodule bilaterally. No nephrolithiasis and no hydronephrosis. No definite contour-deforming renal mass. No ureterolithiasis or hydroureter. The urinary bladder is unremarkable. Stomach/Bowel: Stomach is within normal limits. No evidence of bowel wall thickening or dilatation. Prominent rectosigmoid wall likely due to under distension. Under distended bowel. Appendix appears normal. Vascular/Lymphatic: No abdominal aorta or iliac aneurysm. Moderate to severe atherosclerotic plaque of the aorta and its branches. No abdominal, pelvic, or inguinal lymphadenopathy. Reproductive: Prostate is unremarkable. Other: No intraperitoneal free fluid. No intraperitoneal free gas. No organized fluid collection. Musculoskeletal: No abdominal wall hernia or abnormality. No suspicious lytic or blastic osseous lesions. No acute displaced  fracture. Multilevel degenerative changes of the spine. IMPRESSION: 1. Hydropic gallbladder with common bile duct dilatation-limited evaluation on this noncontrast study. Correlate with liver function tests and consider right upper quadrant ultrasound. 2. Hepatomegaly. 3. Limited evaluation on this noncontrast study. Electronically Signed   By: Morgane  Naveau M.D.   On: 10/08/2023 19:23   DG Hand Complete Left Result Date: 10/08/2023 CLINICAL DATA:  L ring finger infection / pain EXAM: LEFT HAND - COMPLETE 3+ VIEW COMPARISON:  None Available. FINDINGS: Osteopenia. Extensive peripheral vascular atherosclerosis. Bony cortical destruction of the fourth digit involving the distal phalanx and the distal segment of the middle phalanx. Soft tissue swelling fourth phalanx. IMPRESSION: Soft tissue swelling throughout the fourth phalanx. Bony cortical destruction involving the fourth mid and distal phalanges, most consistent with osteomyelitis. Electronically Signed   By: Rance Burrows M.D.   On: 10/08/2023 18:49     Subjective: Patient seen examined bedside, sitting at edge of bed eating breakfast.  Aided in interpretation with Concha Deed #[id number].  Patient continues to complain of hip pain, had negative imaging with MR hip/SI joints over the last 2 days.  Seen by PT with no needs identified.  Discussed with nephrology, will discharge home today following HD to resume his normal schedule on Monday/Wednesday/Friday.  Discussed with ID, Dr. Levern Reader; plan for continued antibiotics with vancomycin  during HD.  Patient denies headache, no chest pain, no shortness of breath, no abdominal pain, no fever/chills/night sweats.  No acute events overnight per nursing staff.  Discharge Exam: Vitals:   10/17/23 0552 10/17/23 0830  BP: 123/73 (!) 121/45  Pulse: 75 84  Resp:  15  Temp: 98.3 F (36.8 C) 98.1 F (36.7 C)  SpO2: 100% 95%   Vitals:   10/16/23 2240 10/16/23 2322 10/17/23 0552 10/17/23 0830  BP: (!) 85/54 131/79  123/73 (!) 121/45  Pulse: 90  75 84  Resp:    15  Temp: 98.5 F (36.9 C)  98.3 F (36.8 C) 98.1 F (36.7 C)  TempSrc: Oral  Oral Oral  SpO2: 100%  100% 95%  Weight:      Height:        Physical Exam: GEN: NAD, alert and oriented x 3, chronically ill appearance, appears older than stated age HEENT: NCAT, PERRL, EOMI, sclera clear, poor dentition PULM: CTAB w/o wheezes/crackles, normal respiratory effort, room air CV: RRR w/o M/G/R, TDC noted to right chest GI: abd soft, NTND, + BS MSK: Noted bilateral BKA, moves all extremities independently, left hand with dressing in place, noted left fourth finger amputation, dressing clean/dry/intact NEURO: No focal neurological deficit PSYCH: normal mood/affect Integumentary: Bilateral BKA and left hand as above, otherwise no other concerning rashes/lesions/wounds noted exposed skin surfaces    The results of significant diagnostics from this hospitalization (including imaging, microbiology, ancillary and laboratory) are listed below for reference.     Microbiology: Recent Results (from the past 240 hours)  Blood Cultures x 2 sites     Status: Abnormal   Collection Time: 10/08/23  7:38 PM   Specimen: BLOOD RIGHT HAND  Result Value Ref Range Status   Specimen Description BLOOD RIGHT HAND  Final   Special Requests   Final    BOTTLES DRAWN AEROBIC AND ANAEROBIC Blood Culture adequate volume   Culture  Setup Time   Final    GRAM POSITIVE COCCI IN BOTH AEROBIC AND ANAEROBIC BOTTLES CRITICAL VALUE NOTED.  VALUE IS CONSISTENT WITH PREVIOUSLY REPORTED AND CALLED VALUE.    Culture (A)  Final    STAPHYLOCOCCUS AUREUS SUSCEPTIBILITIES PERFORMED ON PREVIOUS CULTURE WITHIN THE LAST 5 DAYS. Performed at [facility], [address], [place], [place] [address]    Report Status 10/11/2023 FINAL  Final  Blood Cultures x 2 sites     Status: Abnormal   Collection Time: 10/08/23  7:43 PM   Specimen: BLOOD RIGHT FOREARM  Result Value Ref  Range Status   Specimen Description BLOOD RIGHT FOREARM  Final   Special Requests   Final    BOTTLES DRAWN AEROBIC AND ANAEROBIC Blood Culture adequate volume   Culture  Setup Time   Final    GRAM POSITIVE COCCI IN CLUSTERS IN BOTH AEROBIC AND ANAEROBIC BOTTLES CRITICAL RESULT CALLED TO, READ BACK BY AND VERIFIED WITH: Carmie Chough on [id number] @0900  by SM Performed at [facility], [address] [place], [place] [address]    Culture METHICILLIN RESISTANT STAPHYLOCOCCUS AUREUS (A)  Final   Report Status 10/11/2023 FINAL  Final   Organism ID, Bacteria METHICILLIN RESISTANT STAPHYLOCOCCUS AUREUS  Final      Susceptibility   Methicillin resistant staphylococcus aureus - MIC*    CIPROFLOXACIN  <=0.5 SENSITIVE Sensitive     ERYTHROMYCIN  >=8 RESISTANT Resistant     GENTAMICIN <=0.5 SENSITIVE Sensitive     OXACILLIN >=4 RESISTANT Resistant     TETRACYCLINE >=16 RESISTANT Resistant     VANCOMYCIN  1 SENSITIVE Sensitive     TRIMETH /SULFA  <=10 SENSITIVE Sensitive     CLINDAMYCIN <=0.25 SENSITIVE Sensitive     RIFAMPIN <=0.5 SENSITIVE Sensitive     Inducible Clindamycin NEGATIVE Sensitive     LINEZOLID  2 SENSITIVE Sensitive     * METHICILLIN RESISTANT STAPHYLOCOCCUS AUREUS  Blood Culture ID Panel (Reflexed)     Status: Abnormal   Collection Time: 10/08/23  7:43 PM  Result Value Ref Range Status   Enterococcus  faecalis NOT DETECTED NOT DETECTED Final   Enterococcus Faecium NOT DETECTED NOT DETECTED Final   Listeria monocytogenes NOT DETECTED NOT DETECTED Final   Staphylococcus species DETECTED (A) NOT DETECTED Final    Comment: CRITICAL RESULT CALLED TO, READ BACK BY AND VERIFIED WITH: Carmie Chough on [id number] @0900  by SM    Staphylococcus aureus (BCID) DETECTED (A) NOT DETECTED Final    Comment: Methicillin (oxacillin)-resistant Staphylococcus aureus (MRSA). MRSA is predictably resistant to beta-lactam antibiotics (except ceftaroline). Preferred therapy is vancomycin  unless clinically  contraindicated. Patient requires contact precautions if  hospitalized. CRITICAL RESULT CALLED TO, READ BACK BY AND VERIFIED WITH: Carmie Chough on [phone] @0900  by SM    Staphylococcus epidermidis NOT DETECTED NOT DETECTED Final   Staphylococcus lugdunensis NOT DETECTED NOT DETECTED Final   Streptococcus species NOT DETECTED NOT DETECTED Final   Streptococcus agalactiae NOT DETECTED NOT DETECTED Final   Streptococcus pneumoniae NOT DETECTED NOT DETECTED Final   Streptococcus pyogenes NOT DETECTED NOT DETECTED Final   A.calcoaceticus-baumannii NOT DETECTED NOT DETECTED Final   Bacteroides fragilis NOT DETECTED NOT DETECTED Final   Enterobacterales NOT DETECTED NOT DETECTED Final   Enterobacter cloacae complex NOT DETECTED NOT DETECTED Final   Escherichia coli NOT DETECTED NOT DETECTED Final   Klebsiella aerogenes NOT DETECTED NOT DETECTED Final   Klebsiella oxytoca NOT DETECTED NOT DETECTED Final   Klebsiella pneumoniae NOT DETECTED NOT DETECTED Final   Proteus species NOT DETECTED NOT DETECTED Final   Salmonella species NOT DETECTED NOT DETECTED Final   Serratia marcescens NOT DETECTED NOT DETECTED Final   Haemophilus influenzae NOT DETECTED NOT DETECTED Final   Neisseria meningitidis NOT DETECTED NOT DETECTED Final   Pseudomonas aeruginosa NOT DETECTED NOT DETECTED Final   Stenotrophomonas maltophilia NOT DETECTED NOT DETECTED Final   Candida albicans NOT DETECTED NOT DETECTED Final   Candida auris NOT DETECTED NOT DETECTED Final   Candida glabrata NOT DETECTED NOT DETECTED Final   Candida krusei NOT DETECTED NOT DETECTED Final   Candida parapsilosis NOT DETECTED NOT DETECTED Final   Candida tropicalis NOT DETECTED NOT DETECTED Final   Cryptococcus neoformans/gattii NOT DETECTED NOT DETECTED Final   Meth resistant mecA/C and MREJ DETECTED (A) NOT DETECTED Final    Comment: CRITICAL RESULT CALLED TO, READ BACK BY AND VERIFIED WITH: Carmie Chough on [id number] @0900  by SM Performed at  [facility], [address], [place], [place] [address]   Surgical pcr screen     Status: Abnormal   Collection Time: 10/09/23  2:22 AM   Specimen: Nasal Mucosa; Nasal Swab  Result Value Ref Range Status   MRSA, PCR POSITIVE (A) NEGATIVE Final    Comment: RESULT CALLED TO, READ BACK BY AND VERIFIED WITH: P BROWN RN 10/09/2023 @ 0526 BY AB    Staphylococcus aureus POSITIVE (A) NEGATIVE Final    Comment: (NOTE) The Xpert SA Assay (FDA approved for NASAL specimens in patients 38 years of age and older), is one component of a comprehensive surveillance program. It is not intended to diagnose infection nor to guide or monitor treatment. Performed at [facility], [address] [place], [place] [address]   Aerobic/Anaerobic Culture w Gram Stain (surgical/deep wound)     Status: None   Collection Time: 10/09/23  1:29 PM   Specimen: Finger, Left; Tissue  Result Value Ref Range Status   Specimen Description TISSUE  Final   Special Requests LEFT FINGER ABSCESS  Final   Gram Stain   Final  NO WBC SEEN ABUNDANT GRAM POSITIVE COCCI ABUNDANT GRAM NEGATIVE RODS    Culture   Final    RARE ESCHERICHIA COLI RARE STREPTOCOCCUS ANGINOSIS FEW BACTEROIDES FRAGILIS BETA LACTAMASE POSITIVE Performed at [facility], [address] [place], [place] [address]    Report Status 10/13/2023 FINAL  Final   Organism ID, Bacteria ESCHERICHIA COLI  Final   Organism ID, Bacteria STREPTOCOCCUS ANGINOSIS  Final      Susceptibility   Escherichia coli - MIC*    AMPICILLIN  >=32 RESISTANT Resistant     CEFEPIME  <=0.12 SENSITIVE Sensitive     CEFTAZIDIME <=1 SENSITIVE Sensitive     CEFTRIAXONE  <=0.25 SENSITIVE Sensitive     CIPROFLOXACIN  <=0.25 SENSITIVE Sensitive     GENTAMICIN 8 INTERMEDIATE Intermediate     IMIPENEM <=0.25 SENSITIVE Sensitive     TRIMETH /SULFA  >=320 RESISTANT Resistant     AMPICILLIN /SULBACTAM >=32 RESISTANT Resistant     PIP/TAZO >=128 RESISTANT Resistant ug/mL     * RARE ESCHERICHIA COLI   Streptococcus anginosis - MIC*    PENICILLIN <=0.06 SENSITIVE Sensitive     CEFTRIAXONE  0.25 SENSITIVE Sensitive     ERYTHROMYCIN  <=0.12 SENSITIVE Sensitive     LEVOFLOXACIN  <=0.25 SENSITIVE Sensitive     VANCOMYCIN  0.5 SENSITIVE Sensitive     * RARE STREPTOCOCCUS ANGINOSIS  Culture, blood (Routine X 2) w Reflex to ID Panel     Status: None (Preliminary result)   Collection Time: 10/13/23  1:51 PM   Specimen: BLOOD RIGHT HAND  Result Value Ref Range Status   Specimen Description BLOOD RIGHT HAND  Final   Special Requests   Final    BOTTLES DRAWN AEROBIC AND ANAEROBIC Blood Culture results may not be optimal due to an inadequate volume of blood received in culture bottles   Culture   Final    NO GROWTH 4 DAYS Performed at [facility], [address] [place], [place] [address]    Report Status PENDING  Incomplete  Culture, blood (Routine X 2) w Reflex to ID Panel     Status: None (Preliminary result)   Collection Time: 10/13/23  1:52 PM   Specimen: BLOOD RIGHT HAND  Result Value Ref Range Status   Specimen Description BLOOD RIGHT HAND  Final   Special Requests   Final    BOTTLES DRAWN AEROBIC AND ANAEROBIC Blood Culture adequate volume   Culture   Final    NO GROWTH 4 DAYS Performed at [facility], [address], [place], [place] [address]    Report Status PENDING  Incomplete     Labs: BNP (last 3 results) No results for input(s): "BNP" in the last 8760 hours. Basic Metabolic Panel: Recent Labs  Lab 10/13/23 0646 10/14/23 0822 10/15/23 1548 10/16/23 0607 10/17/23 0656  NA 129* 129* 135 132* 133*  K 4.5 5.1 4.2 4.2 4.5  CL 93* 92* 93* 95* 95*  CO2 17* 15* 21* 19* 23  GLUCOSE 198* 202* 177* 212* 207*  BUN 79* 98* 44* 51* 28*  CREATININE 9.92* 11.91* 7.24* 8.38* 6.05*  CALCIUM  9.1 9.2 8.6* 8.6* 9.0  PHOS  --   --  5.7* 6.8* 6.3*   Liver Function Tests: Recent Labs  Lab 10/11/23 0627 10/12/23 0608 10/13/23 0646  10/14/23 0822 10/15/23 1548 10/16/23 0607 10/17/23 0656  AST 31 33 27 23  --   --   --   ALT 20 21 18 16   --   --   --  ALKPHOS 411* 488* 432* 409*  --   --   --   BILITOT 1.5* 1.4* 1.2 1.4*  --   --   --   PROT 6.7 6.9 6.8 7.3  --   --   --   ALBUMIN  2.2* 2.2* 2.1* 2.2* 2.3* 2.2* 2.5*   No results for input(s): "LIPASE", "AMYLASE" in the last 168 hours. No results for input(s): "AMMONIA" in the last 168 hours. CBC: Recent Labs  Lab 10/13/23 0646 10/14/23 0822 10/15/23 1548 10/16/23 0607 10/17/23 0656  WBC 13.8* 14.3* 10.8* 10.7* 11.3*  HGB 11.1* 11.0* 10.7* 11.0* 10.2*  HCT 32.7* 32.9* 31.6* 32.9* 31.7*  MCV 96.7 95.6 98.1 97.1 99.1  PLT 91* 110* 129* 156 169   Cardiac Enzymes: No results for input(s): "CKTOTAL", "CKMB", "CKMBINDEX", "TROPONINI" in the last 168 hours. BNP: Invalid input(s): "POCBNP" CBG: Recent Labs  Lab 10/16/23 2318 10/17/23 0103 10/17/23 0547 10/17/23 0607 10/17/23 1121  GLUCAP 72 174* 220* 200* 169*   D-Dimer No results for input(s): "DDIMER" in the last 72 hours. Hgb A1c No results for input(s): "HGBA1C" in the last 72 hours. Lipid Profile No results for input(s): "CHOL", "HDL", "LDLCALC", "TRIG", "CHOLHDL", "LDLDIRECT" in the last 72 hours. Thyroid function studies No results for input(s): "TSH", "T4TOTAL", "T3FREE", "THYROIDAB" in the last 72 hours.  Invalid input(s): "FREET3" Anemia work up No results for input(s): "VITAMINB12", "FOLATE", "FERRITIN", "TIBC", "IRON ", "RETICCTPCT" in the last 72 hours. Urinalysis    Component Value Date/Time   COLORURINE YELLOW 07/04/2010 1840   APPEARANCEUR TURBID (A) 07/04/2010 1840   LABSPEC 1.013 07/04/2010 1840   PHURINE 7.0 07/04/2010 1840   GLUCOSEU NEG mg/dL 72/53/6644 0347   HGBUR TRACE (A) 03/13/2010 1824   BILIRUBINUR NEG 07/04/2010 1840   KETONESUR NEG mg/dL 42/59/5638 7564   PROTEINUR > 300 mg/dL (A) 33/29/5188 4166   UROBILINOGEN 0.2 07/04/2010 1840   NITRITE NEG 07/04/2010 1840    LEUKOCYTESUR LARGE (A) 07/04/2010 1840   Sepsis Labs Recent Labs  Lab 10/14/23 0822 10/15/23 1548 10/16/23 0607 10/17/23 0656  WBC 14.3* 10.8* 10.7* 11.3*   Microbiology Recent Results (from the past 240 hours)  Blood Cultures x 2 sites     Status: Abnormal   Collection Time: 10/08/23  7:38 PM   Specimen: BLOOD RIGHT HAND  Result Value Ref Range Status   Specimen Description BLOOD RIGHT HAND  Final   Special Requests   Final    BOTTLES DRAWN AEROBIC AND ANAEROBIC Blood Culture adequate volume   Culture  Setup Time   Final    GRAM POSITIVE COCCI IN BOTH AEROBIC AND ANAEROBIC BOTTLES CRITICAL VALUE NOTED.  VALUE IS CONSISTENT WITH PREVIOUSLY REPORTED AND CALLED VALUE.    Culture (A)  Final    STAPHYLOCOCCUS AUREUS SUSCEPTIBILITIES PERFORMED ON PREVIOUS CULTURE WITHIN THE LAST 5 DAYS. Performed at [facility], [address], [place], [place] [address]    Report Status 10/11/2023 FINAL  Final  Blood Cultures x 2 sites     Status: Abnormal   Collection Time: 10/08/23  7:43 PM   Specimen: BLOOD RIGHT FOREARM  Result Value Ref Range Status   Specimen Description BLOOD RIGHT FOREARM  Final   Special Requests   Final    BOTTLES DRAWN AEROBIC AND ANAEROBIC Blood Culture adequate volume   Culture  Setup Time   Final    GRAM POSITIVE COCCI IN CLUSTERS IN BOTH AEROBIC AND ANAEROBIC BOTTLES CRITICAL RESULT CALLED TO, READ BACK BY AND VERIFIED WITH:  Carmie Chough on [id number] @0900  by SM Performed at [facility], [address], [place], [place] [address]    Culture METHICILLIN RESISTANT STAPHYLOCOCCUS AUREUS (A)  Final   Report Status 10/11/2023 FINAL  Final   Organism ID, Bacteria METHICILLIN RESISTANT STAPHYLOCOCCUS AUREUS  Final      Susceptibility   Methicillin resistant staphylococcus aureus - MIC*    CIPROFLOXACIN  <=0.5 SENSITIVE Sensitive     ERYTHROMYCIN  >=8 RESISTANT Resistant     GENTAMICIN <=0.5 SENSITIVE Sensitive     OXACILLIN >=4 RESISTANT Resistant      TETRACYCLINE >=16 RESISTANT Resistant     VANCOMYCIN  1 SENSITIVE Sensitive     TRIMETH /SULFA  <=10 SENSITIVE Sensitive     CLINDAMYCIN <=0.25 SENSITIVE Sensitive     RIFAMPIN <=0.5 SENSITIVE Sensitive     Inducible Clindamycin NEGATIVE Sensitive     LINEZOLID  2 SENSITIVE Sensitive     * METHICILLIN RESISTANT STAPHYLOCOCCUS AUREUS  Blood Culture ID Panel (Reflexed)     Status: Abnormal   Collection Time: 10/08/23  7:43 PM  Result Value Ref Range Status   Enterococcus faecalis NOT DETECTED NOT DETECTED Final   Enterococcus Faecium NOT DETECTED NOT DETECTED Final   Listeria monocytogenes NOT DETECTED NOT DETECTED Final   Staphylococcus species DETECTED (A) NOT DETECTED Final    Comment: CRITICAL RESULT CALLED TO, READ BACK BY AND VERIFIED WITH: Carmie Chough on [id number] @0900  by SM    Staphylococcus aureus (BCID) DETECTED (A) NOT DETECTED Final    Comment: Methicillin (oxacillin)-resistant Staphylococcus aureus (MRSA). MRSA is predictably resistant to beta-lactam antibiotics (except ceftaroline). Preferred therapy is vancomycin  unless clinically contraindicated. Patient requires contact precautions if  hospitalized. CRITICAL RESULT CALLED TO, READ BACK BY AND VERIFIED WITH: Carmie Chough on [phone] @0900  by SM    Staphylococcus epidermidis NOT DETECTED NOT DETECTED Final   Staphylococcus lugdunensis NOT DETECTED NOT DETECTED Final   Streptococcus species NOT DETECTED NOT DETECTED Final   Streptococcus agalactiae NOT DETECTED NOT DETECTED Final   Streptococcus pneumoniae NOT DETECTED NOT DETECTED Final   Streptococcus pyogenes NOT DETECTED NOT DETECTED Final   A.calcoaceticus-baumannii NOT DETECTED NOT DETECTED Final   Bacteroides fragilis NOT DETECTED NOT DETECTED Final   Enterobacterales NOT DETECTED NOT DETECTED Final   Enterobacter cloacae complex NOT DETECTED NOT DETECTED Final   Escherichia coli NOT DETECTED NOT DETECTED Final   Klebsiella aerogenes NOT DETECTED NOT DETECTED Final    Klebsiella oxytoca NOT DETECTED NOT DETECTED Final   Klebsiella pneumoniae NOT DETECTED NOT DETECTED Final   Proteus species NOT DETECTED NOT DETECTED Final   Salmonella species NOT DETECTED NOT DETECTED Final   Serratia marcescens NOT DETECTED NOT DETECTED Final   Haemophilus influenzae NOT DETECTED NOT DETECTED Final   Neisseria meningitidis NOT DETECTED NOT DETECTED Final   Pseudomonas aeruginosa NOT DETECTED NOT DETECTED Final   Stenotrophomonas maltophilia NOT DETECTED NOT DETECTED Final   Candida albicans NOT DETECTED NOT DETECTED Final   Candida auris NOT DETECTED NOT DETECTED Final   Candida glabrata NOT DETECTED NOT DETECTED Final   Candida krusei NOT DETECTED NOT DETECTED Final   Candida parapsilosis NOT DETECTED NOT DETECTED Final   Candida tropicalis NOT DETECTED NOT DETECTED Final   Cryptococcus neoformans/gattii NOT DETECTED NOT DETECTED Final   Meth resistant mecA/C and MREJ DETECTED (A) NOT DETECTED Final    Comment: CRITICAL RESULT CALLED TO, READ BACK BY AND VERIFIED WITH: Carmie Chough on [id number] @0900  by SM Performed at [facility], [address],  [place], [place] [address]   Surgical pcr screen     Status: Abnormal   Collection Time: 10/09/23  2:22 AM   Specimen: Nasal Mucosa; Nasal Swab  Result Value Ref Range Status   MRSA, PCR POSITIVE (A) NEGATIVE Final    Comment: RESULT CALLED TO, READ BACK BY AND VERIFIED WITH: P BROWN RN 10/09/2023 @ 0526 BY AB    Staphylococcus aureus POSITIVE (A) NEGATIVE Final    Comment: (NOTE) The Xpert SA Assay (FDA approved for NASAL specimens in patients 78 years of age and older), is one component of a comprehensive surveillance program. It is not intended to diagnose infection nor to guide or monitor treatment. Performed at [facility], [address] [place], [place] [address]   Aerobic/Anaerobic Culture w Gram Stain (surgical/deep wound)     Status: None   Collection Time: 10/09/23  1:29 PM   Specimen:  Finger, Left; Tissue  Result Value Ref Range Status   Specimen Description TISSUE  Final   Special Requests LEFT FINGER ABSCESS  Final   Gram Stain   Final    NO WBC SEEN ABUNDANT GRAM POSITIVE COCCI ABUNDANT GRAM NEGATIVE RODS    Culture   Final    RARE ESCHERICHIA COLI RARE STREPTOCOCCUS ANGINOSIS FEW BACTEROIDES FRAGILIS BETA LACTAMASE POSITIVE Performed at [facility], [address], [place], [place] [address]    Report Status 10/13/2023 FINAL  Final   Organism ID, Bacteria ESCHERICHIA COLI  Final   Organism ID, Bacteria STREPTOCOCCUS ANGINOSIS  Final      Susceptibility   Escherichia coli - MIC*    AMPICILLIN  >=32 RESISTANT Resistant     CEFEPIME  <=0.12 SENSITIVE Sensitive     CEFTAZIDIME <=1 SENSITIVE Sensitive     CEFTRIAXONE  <=0.25 SENSITIVE Sensitive     CIPROFLOXACIN  <=0.25 SENSITIVE Sensitive     GENTAMICIN 8 INTERMEDIATE Intermediate     IMIPENEM <=0.25 SENSITIVE Sensitive     TRIMETH /SULFA  >=320 RESISTANT Resistant     AMPICILLIN /SULBACTAM >=32 RESISTANT Resistant     PIP/TAZO >=128 RESISTANT Resistant ug/mL    * RARE ESCHERICHIA COLI   Streptococcus anginosis - MIC*    PENICILLIN <=0.06 SENSITIVE Sensitive     CEFTRIAXONE  0.25 SENSITIVE Sensitive     ERYTHROMYCIN  <=0.12 SENSITIVE Sensitive     LEVOFLOXACIN  <=0.25 SENSITIVE Sensitive     VANCOMYCIN  0.5 SENSITIVE Sensitive     * RARE STREPTOCOCCUS ANGINOSIS  Culture, blood (Routine X 2) w Reflex to ID Panel     Status: None (Preliminary result)   Collection Time: 10/13/23  1:51 PM   Specimen: BLOOD RIGHT HAND  Result Value Ref Range Status   Specimen Description BLOOD RIGHT HAND  Final   Special Requests   Final    BOTTLES DRAWN AEROBIC AND ANAEROBIC Blood Culture results may not be optimal due to an inadequate volume of blood received in culture bottles   Culture   Final    NO GROWTH 4 DAYS Performed at [facility], [address], [place], [place] [address]    Report Status PENDING   Incomplete  Culture, blood (Routine X 2) w Reflex to ID Panel     Status: None (Preliminary result)   Collection Time: 10/13/23  1:52 PM   Specimen: BLOOD RIGHT HAND  Result Value Ref Range Status   Specimen Description BLOOD RIGHT HAND  Final   Special Requests   Final    BOTTLES DRAWN AEROBIC AND ANAEROBIC Blood Culture adequate volume  Culture   Final    NO GROWTH 4 DAYS Performed at [facility], [address] [place], [place] [address]    Report Status PENDING  Incomplete     Time coordinating discharge: Over 30 minutes  SIGNED:   Rema Care [place], DO  [facility] 10/17/2023, 1:57 PM

## 2023-10-17 NOTE — Plan of Care (Signed)
   Problem: Activity: Goal: Risk for activity intolerance will decrease Outcome: Progressing   Problem: Safety: Goal: Ability to remain free from injury will improve Outcome: Progressing   Problem: Skin Integrity: Goal: Risk for impaired skin integrity will decrease Outcome: Progressing

## 2023-10-17 NOTE — Discharge Planning (Signed)
 [facility] Patient Discharge Orders- [facility] CLINIC: [facility]  Patient's name: Alvin Daniels Admit/DC Dates: 10/08/2023 - 10/17/23  Discharge Diagnoses: MRSA bacteremia   Finger necrosis s/p amputation  Aranesp : Given: no   Date and amount of last dose: N/A  Last Hgb: 10.2 PRBC's Given: no Date/# of units: N/A ESA dose for discharge: same dose IV Iron  dose at discharge: same dose  Heparin  change: no  EDW Change: no New EDW:   [place] Change: no  Access intervention/Change: Yes Details: TDC removed for line holiday 10/12/23 and replaced 10/14/23  Hectorol /Calcitriol  change: no  Discharge Labs: Calcium  9.0 Phosphorus 6.3 Albumin  2.5 K+ 4.5  IV Antibiotics: Yes Details: vancomycin  500mg  IV q HD-stop date November 24, 2023  On Coumadin?: no Last INR: Next INR: Managed By:   OTHER/APPTS/LAB ORDERS:    D/C Meds to be reconciled by nurse after every discharge.  Completed By: Ramona Burner, PA-C 10/17/2023, 1:25 PM  [facility] Pager: [phone]    Reviewed by: MD:______ RN_______

## 2023-10-17 NOTE — Progress Notes (Signed)
   10/17/23 1928  Vitals  Temp 98.7 F (37.1 C)  Pulse Rate 96  Resp 16  BP 105/66  SpO2 98 %  O2 Device Room Air  Weight (S)  47.2 kg (Bed Scale)  Type of Weight Post-Dialysis  Oxygen Therapy  Patient Activity (if Appropriate) In bed  Pulse Oximetry Type Continuous  Post Treatment  Dialyzer Clearance Clear  Hemodialysis Intake (mL) 0 mL  Liters Processed 84  Fluid Removed (mL) 1000 mL  Tolerated HD Treatment Yes  Post-Hemodialysis Comments Pt. tolerated treatment well but had to turn the UF off X2, due to soft BP,but UF goal was obtained. Report call to 5N bedside RN. Admin medication per. order.   Received patient in bed to unit.  Alert and oriented.  Informed consent signed and in chart.   TX duration: 3.5  Patient tolerated well.  Transported back to the room  Alert, without acute distress.  Hand-off given to patient's nurse.   Access used: Yes Access issues: No  Total UF removed: 1000 Medication(s) given: See MAR Post HD VS: See Above Grid Post HD weight: 47.2 kg   [facility]

## 2023-10-17 NOTE — Inpatient Diabetes Management (Signed)
 Inpatient Diabetes Program Recommendations  AACE/ADA: New Consensus Statement on Inpatient Glycemic Control (2015)  Target Ranges:  Prepandial:   less than 140 mg/dL      Peak postprandial:   less than 180 mg/dL (1-2 hours)      Critically ill patients:  140 - 180 mg/dL   Lab Results  Component Value Date   GLUCAP 200 (H) 10/17/2023   HGBA1C 10.0 (A) 07/17/2023    Latest Reference Range & Units 10/16/23 06:39 10/16/23 12:59 10/16/23 15:59 10/16/23 22:27 10/16/23 23:18 10/17/23 01:03 10/17/23 05:47 10/17/23 06:07  Glucose-Capillary 70 - 99 mg/dL 191 (H) 478 (H) 295 (H) 66 (L) 72 174 (H) 220 (H) 200 (H)  (H): Data is abnormally high (L): Data is abnormally low  Diabetes history: DM2 Outpatient Diabetes medications: 70/30 5 units bid  Current orders for Inpatient glycemic control: 70/30 10 units bid, Novolog  0-9 units tid with meals and HS   Inpatient Diabetes Program Recommendations:   Patient had hypoglycemia post Novolog  correction + 70/30 8 units last hs Please consider: -Decrease Novolog  correction to 0-6 units tid, 0-5 units hs -Decrease pm 70/30 insulin  to 8 units  Thank you, Laquisha Northcraft E. Corry Storie, RN, MSN, CDCES  Diabetes Coordinator Inpatient Glycemic Control Team Team Pager [phone] (8am-5pm) 10/17/2023 9:40 AM

## 2023-10-17 NOTE — Progress Notes (Signed)
 Out Patient Arrangements:  Pt is established at [facility] on their MWF shift. Noted pt will need IV vanc w/ HD. The clinic is able to give.   Alvin Daniels HPSS [phone]

## 2023-10-17 NOTE — Progress Notes (Signed)
 Patient declined to go to dialysis due to already having it yesterday. He states that going two days back to back is too much on his body.

## 2023-10-17 NOTE — Progress Notes (Signed)
 Patient now states he is ok with going to dialysis today. RN will reach out to nephrology

## 2023-10-18 LAB — CULTURE, BLOOD (ROUTINE X 2)
Culture: NO GROWTH
Culture: NO GROWTH
Special Requests: ADEQUATE

## 2023-10-18 LAB — GLUCOSE, CAPILLARY: Glucose-Capillary: 132 mg/dL — ABNORMAL HIGH (ref 70–99)

## 2023-10-18 NOTE — Progress Notes (Signed)
 Alvin Daniels is a 53 year old male admitted with sepsis secondary to left fourth finger osteomyelitis s/p amputation, MRSA bacteremia.  Patient is stable for discharge has been set up with outpatient antibiotics to utilize with hemodialysis.  Patient planned discharge home on 10/17/2023, but finished hemodialysis late and did not have a ride home.  Continues to be medically stable for discharge.  For further information see discharge summary dated 10/17/2023.

## 2023-10-18 NOTE — Progress Notes (Signed)
 Pt. Has order for discharge, no transportation at this time MD and [facility] agreeable pt. Can stay over night and will discharge in the morning

## 2023-10-20 ENCOUNTER — Telehealth: Payer: Self-pay | Admitting: *Deleted

## 2023-10-20 NOTE — Transitions of Care (Post Inpatient/ED Visit) (Signed)
   10/20/2023  Name: Alvin Daniels MRN: [id number] DOB: June 05, 1970  Today's TOC FU Call Status: Today's TOC FU Call Status:: Unsuccessful Call (1st Attempt) Unsuccessful Call (1st Attempt) Date: 10/20/23  Attempted to reach the patient regarding the most recent Inpatient/ED visit. Patient is currently in dialysis and unable to take this telephone call.   Follow Up Plan: Additional outreach attempts will be made to reach the patient to complete the Transitions of Care (Post Inpatient/ED visit) call.   Arna Better RN, BSN [place]  [facility] RN Care Manager [phone]

## 2023-10-20 NOTE — Progress Notes (Signed)
 Late Note entry- October 20, 2023  Contacted [facility] this morning to be advised of pt's d/c on Saturday and that pt should resume care today.   Lauraine Polite Renal Navigator [phone]

## 2023-10-21 ENCOUNTER — Telehealth: Payer: Self-pay | Admitting: *Deleted

## 2023-10-21 NOTE — Transitions of Care (Post Inpatient/ED Visit) (Signed)
 10/21/2023  Name: Alvin Daniels MRN: [id number] DOB: 30-Jan-1971  Today's TOC FU Call Status: Today's TOC FU Call Status:: Successful TOC FU Call Completed TOC FU Call Complete Date: 10/21/23 Patient's Name and Date of Birth confirmed.  Transition Care Management Follow-up Telephone Call Date of Discharge: 10/18/23 Discharge Facility: [facility]) Type of Discharge: Inpatient Admission Primary Inpatient Discharge Diagnosis:: Sepsis How have you been since you were released from the hospital?: Better Any questions or concerns?: Yes Patient Questions/Concerns:: Patient continues to have hip pain. Pain improves with pain medication. Patient Questions/Concerns Addressed: Other: (education provided)  Items Reviewed: Did you receive and understand the discharge instructions provided?: Yes Medications obtained,verified, and reconciled?: Yes (Medications Reviewed) Any new allergies since your discharge?: No Dietary orders reviewed?: Yes Type of Diet Ordered:: Renal/consistent carbohydrate diet with 1200 mL fluid restriction Do you have support at home?: Yes People in Home [RPT]: friend(s) Name of Support/Comfort Primary Source: [place]/Friend  Medications Reviewed Today: Medications Reviewed Today     Reviewed by Aura Leeds, RN (Registered Nurse) on 10/21/23 at 1645  Med List Status: <None>   Medication Order Taking? Sig Documenting Provider Last Dose Status Informant    Discontinued 05/29/22 1351 (Reorder)   bictegravir-emtricitabine -tenofovir  AF (BIKTARVY ) 50-200-25 MG TABS tablet [id number] Yes Take 1 tablet by mouth daily. Ernie Heal, Jerelyn Money, MD Taking Active Self, Pharmacy Records  gabapentin  (NEURONTIN ) 100 MG capsule [id number] Yes Take 1 capsule (100 mg total) by mouth at bedtime. [place], Rema Care, DO Taking Active   HYDROcodone -acetaminophen  (NORCO/VICODIN) 5-325 MG tablet [id number] Yes Take 1 tablet by mouth every 6 (six) hours as needed for severe  pain (pain score 7-10) or moderate pain (pain score 4-6). [place], Rema Care, DO Taking Active   insulin  aspart protamine  - aspart (NOVOLOG  70/30 FLEXPEN) (70-30) 100 UNIT/ML FlexPen [id number] Yes Inject 20 Units into the skin 2 (two) times daily.  Patient taking differently: Inject 15 Units into the skin 2 (two) times daily.   [name], MD Taking Active Self, Pharmacy Records           Med Note (Tyrion Glaude A   Tue Oct 21, 2023  4:42 PM) Patient taking 16 units twice daily  midodrine  (PROAMATINE ) 10 MG tablet [id number] Yes Take 1 tablet (10 mg total) by mouth 3 (three) times daily as needed for low blood pressure, SBP</=100. may take 30 min prior to dialysis and mid treatment as needed.  Taking Active Self, Pharmacy Records           Med Note (Derrian Rodak A   Tue Oct 21, 2023  4:41 PM)    vancomycin  IVPB [id number] Yes Inject 500 mg into the vein every Monday, Wednesday, and Friday with hemodialysis. Indication:  MRSA endocarditis Last Day of Therapy:  11/24/23 Labs - Once Weekly:  CBC/D, BMP, and vancomycin  trough, ESR and CRP Method of administration: Per hemodialysis protocol - to be given at the HD center Liane Redman, MD Taking Active   Med List Note Zara Heymann, CPhT 08/30/23 7425): Dialysis Mon-Weds-Fri; [facility].             Home Care and Equipment/Supplies: Were Home Health Services Ordered?: No Any new equipment or medical supplies ordered?: No  Functional Questionnaire: Do you need assistance with bathing/showering or dressing?: No Do you need assistance with meal preparation?: No Do you need assistance with eating?: No Do you have difficulty maintaining continence: No Do you need assistance with getting out of  bed/getting out of a chair/moving?: No Do you have difficulty managing or taking your medications?: No  Follow up appointments reviewed: PCP Follow-up appointment confirmed?: No (Engaged with [facility] Care Guide to schedule  hospital follow up-Patient unable to take available appointment on 10/28/23) MD Provider Line Number:[phone] Given: No Specialist Hospital Follow-up appointment confirmed?: No Reason Specialist Follow-Up Not Confirmed: Patient has Specialist Provider Number and will Call for Appointment Do you need transportation to your follow-up appointment?: No Do you understand care options if your condition(s) worsen?: Yes-patient verbalized understanding  SDOH Interventions Today    Flowsheet Row Most Recent Value  SDOH Interventions   Food Insecurity Interventions Intervention Not Indicated  Housing Interventions Intervention Not Indicated  Transportation Interventions Intervention Not Indicated  Utilities Interventions Intervention Not Indicated       Goals Addressed             This Visit's Progress    [facility] Transitions of Care (TOC) Care Plan       Problems:  Recent Hospitalization for treatment of Sepsis Knowledge Deficit Related to language barrier  Goal:  Over the next 30 days, the patient will not experience hospital readmission  Interventions:  Transitions of Care: Durable Medical Equipment (DME) reviewed with patient/caregiver Doctor Visits  - discussed the importance of doctor visits Post discharge activity limitations prescribed by provider reviewed Post-op wound/incision care reviewed with patient/caregiver Reviewed Signs and symptoms of infection Engaged [facility]BCI Care Guide to assist with scheduling PCP Hospital Follow up visit-patient declined provided appointment on 10/28/23 and will call for an appointment  Medication review, explained that patient would need to see his provider to request refills of pain medication Reviewed and discussed discharge information, advised patient to call and schedule with [facility] Utilized Spanish interpreter [phone] via [facility] [employer]  Patient Self Care Activities:  Attend all scheduled provider appointments Call  provider office for new concerns or questions  Participate in Transition of Care Program/Attend [facility] scheduled calls Take medications as prescribed    Plan:  Telephone follow up appointment with care management team member scheduled for:  10/28/23 at 10:30am        Arna Better RN, BSN [place]  [facility] RN Care Manager [phone]

## 2023-10-21 NOTE — Anesthesia Postprocedure Evaluation (Signed)
 Anesthesia Post Note  Patient: Alvin Daniels  Procedure(s) Performed: TRANSESOPHAGEAL ECHOCARDIOGRAM     Patient location during evaluation: Cath Lab Anesthesia Type: MAC Level of consciousness: awake and alert Pain management: pain level controlled Vital Signs Assessment: post-procedure vital signs reviewed and stable Respiratory status: spontaneous breathing, nonlabored ventilation and respiratory function stable Cardiovascular status: stable and blood pressure returned to baseline Postop Assessment: no apparent nausea or vomiting Anesthetic complications: no   No notable events documented.                Sarh Kirschenbaum

## 2023-10-28 ENCOUNTER — Other Ambulatory Visit: Payer: Self-pay | Admitting: *Deleted

## 2023-10-28 NOTE — Transitions of Care (Post Inpatient/ED Visit) (Signed)
   10/28/2023  Name: Alvin Daniels MRN: [id number] DOB: 05-17-1970  RNCM has a successful outreach with [name] with the assistance of Spanish interpreter via [employer]. Call was dropped early into the conversation. Multiple attempts were made to reconnect with patient. A detailed message was left, including RNCM contact information. RNCM will attempt to reach patient tomorrow.  Arna Better RN, BSN [place]  [facility] RN Care Manager [phone]

## 2023-10-29 ENCOUNTER — Encounter: Payer: Self-pay | Admitting: *Deleted

## 2023-11-10 ENCOUNTER — Other Ambulatory Visit: Payer: Self-pay

## 2023-11-10 ENCOUNTER — Encounter (HOSPITAL_COMMUNITY): Payer: Self-pay | Admitting: *Deleted

## 2023-11-10 ENCOUNTER — Emergency Department (HOSPITAL_COMMUNITY): Payer: Self-pay

## 2023-11-10 ENCOUNTER — Emergency Department (HOSPITAL_COMMUNITY)
Admission: EM | Admit: 2023-11-10 | Discharge: 2023-11-10 | Disposition: A | Discharge status: Home / Self Care | Payer: Self-pay | Attending: Emergency Medicine | Admitting: Emergency Medicine

## 2023-11-10 DIAGNOSIS — Z794 Long term (current) use of insulin: Secondary | ICD-10-CM | POA: Insufficient documentation

## 2023-11-10 DIAGNOSIS — Z21 Asymptomatic human immunodeficiency virus [HIV] infection status: Secondary | ICD-10-CM | POA: Insufficient documentation

## 2023-11-10 DIAGNOSIS — E1122 Type 2 diabetes mellitus with diabetic chronic kidney disease: Secondary | ICD-10-CM | POA: Insufficient documentation

## 2023-11-10 DIAGNOSIS — T829XXA Unspecified complication of cardiac and vascular prosthetic device, implant and graft, initial encounter: Secondary | ICD-10-CM

## 2023-11-10 DIAGNOSIS — N186 End stage renal disease: Secondary | ICD-10-CM | POA: Insufficient documentation

## 2023-11-10 DIAGNOSIS — Z992 Dependence on renal dialysis: Secondary | ICD-10-CM | POA: Insufficient documentation

## 2023-11-10 DIAGNOSIS — T8249XA Other complication of vascular dialysis catheter, initial encounter: Secondary | ICD-10-CM | POA: Insufficient documentation

## 2023-11-10 LAB — CBC WITH DIFFERENTIAL/PLATELET
Abs Immature Granulocytes: 0.03 10*3/uL (ref 0.00–0.07)
Basophils Absolute: 0.1 10*3/uL (ref 0.0–0.1)
Basophils Relative: 2 %
Eosinophils Absolute: 0.2 10*3/uL (ref 0.0–0.5)
Eosinophils Relative: 2 %
HCT: 30.6 % — ABNORMAL LOW (ref 39.0–52.0)
Hemoglobin: 10 g/dL — ABNORMAL LOW (ref 13.0–17.0)
Immature Granulocytes: 0 %
Lymphocytes Relative: 16 %
Lymphs Abs: 1.3 10*3/uL (ref 0.7–4.0)
MCH: 32.2 pg (ref 26.0–34.0)
MCHC: 32.7 g/dL (ref 30.0–36.0)
MCV: 98.4 fL (ref 80.0–100.0)
Monocytes Absolute: 0.5 10*3/uL (ref 0.1–1.0)
Monocytes Relative: 6 %
Neutro Abs: 6.2 10*3/uL (ref 1.7–7.7)
Neutrophils Relative %: 74 %
Platelets: 260 10*3/uL (ref 150–400)
RBC: 3.11 MIL/uL — ABNORMAL LOW (ref 4.22–5.81)
RDW: 14.6 % (ref 11.5–15.5)
WBC: 8.3 10*3/uL (ref 4.0–10.5)
nRBC: 0 % (ref 0.0–0.2)

## 2023-11-10 LAB — BASIC METABOLIC PANEL WITH GFR
Anion gap: 18 — ABNORMAL HIGH (ref 5–15)
BUN: 36 mg/dL — ABNORMAL HIGH (ref 6–20)
CO2: 18 mmol/L — ABNORMAL LOW (ref 22–32)
Calcium: 9.1 mg/dL (ref 8.9–10.3)
Chloride: 94 mmol/L — ABNORMAL LOW (ref 98–111)
Creatinine, Ser: 11.35 mg/dL — ABNORMAL HIGH (ref 0.61–1.24)
GFR, Estimated: 5 mL/min — ABNORMAL LOW (ref >60)
Glucose, Bld: 222 mg/dL — ABNORMAL HIGH (ref 70–99)
Potassium: 3.8 mmol/L (ref 3.5–5.1)
Sodium: 130 mmol/L — ABNORMAL LOW (ref 135–145)

## 2023-11-10 NOTE — Discharge Instructions (Addendum)
 Thank you for letting us  evaluate you today.  Fortunately, you do not need emergent dialysis with your reassuring labs.  Unfortunately, we do not have anybody onsite at this time to replace your catheter.  I have spoken with Dr. Tobie with nephrology who will reach out to you tomorrow to schedule an appointment to have your catheter replaced tomorrow.  Please do not eat after midnight.  Of note, incidental finding of Slight nodularity in the lower right hemithorax, as on 10/09/2023. Please follow up with primary care provider for further imaging  Gracias por permitirnos evaluarlo hoy. Afortunadamente, no necesita dilisis de emergencia, ya que sus resultados de laboratorio son positivos. Lamentablemente, no tenemos personal disponible en este momento para reemplazar su catter. He hablado con el Dr. Tobie, de nefrologa, quien se comunicar con usted maana para programar una cita para el reemplazo del catter. Por favor, no coma despus de la medianoche.  Cabe destacar el hallazgo incidental de una ligera nodularidad en el hemitrax inferior derecho el 29/09/2023. Por favor, consulte con su mdico de cabecera para obtener ms imgenes

## 2023-11-10 NOTE — ED Provider Triage Note (Signed)
 Emergency Medicine Provider Triage Evaluation Note  Alvin Daniels , a 53 y.o. male  was evaluated in triage.  Pt complains of hemodialysis catheter not working right anterior chest.  Patient normally dialyzed Monday Wednesdays and Fridays.  Last dialyzed on Friday.  Patient went to dialysis today catheter was not working he was sent in to have that replaced.  Review of Systems  Positive:  Negative: Chest pain shortness of breath  Physical Exam  BP 100/74   Pulse 88   Temp 97.6 F (36.4 C) (Oral)   Resp 18   Ht 1.499 m (4' 11)   Wt 47.2 kg   SpO2 97%   BMI 21.02 kg/m  Gen: Awake, no distress   Resp: Normal effort lungs clear to auscultation bilaterally.  Dialysis catheter left anterior chest MSK: Bilateral lower extremity amputation Other:  Medical Decision Making  Medically screening exam initiated at 11:48 AM.  Appropriate orders placed.  Alvin Daniels was informed that the remainder of the evaluation will be completed by another provider, this initial triage assessment does not replace that evaluation, and the importance of remaining in the ED until their evaluation is complete.  Patient here for dialysis catheter not working properly.  Normally dialyzed Monday Wednesdays and Fridays.  Was last dialyzed on Friday.  Went there today but it would not work.  Will get chest x-ray CBC basic metabolic panel and EKG.     Arantxa Piercey, MD 11/10/23 1154

## 2023-11-10 NOTE — ED Provider Notes (Signed)
 Alvin Daniels   CSN: [id number] Arrival date & time: 11/10/23  1037     Patient presents with: Right Chest HD Catheter needs to be checked for placement (Pt went to HD today and they did not complete because he can feel the catheter in right chest move with coughing. Pt is alert and in NAD.)   Alvin Daniels is a 53 y.o. male with past medical history of DM, ESRD (on HD), anemia, HIV, bilateral BKA, GERD, MRSA presents to emergency department for evaluation of perma catheter not flushing for dialysis today.  He normally goes to dialysis MWF.  Last dialysis was successful on Friday. Cath placed by Dr. Melia on 09/01/23. No complaints of CP, SHOB   HPI     Prior to Admission medications   Medication Sig Start Date End Date Taking? Authorizing Provider  bictegravir-emtricitabine -tenofovir  AF (BIKTARVY ) 50-200-25 MG TABS tablet Take 1 tablet by mouth daily. 04/16/23   Fleeta Kathie Jomarie LOISE, MD  gabapentin  (NEURONTIN ) 100 MG capsule Take 1 capsule (100 mg total) by mouth at bedtime. 10/17/23 01/15/24  [place], Eric J, DO  HYDROcodone -acetaminophen  (NORCO/VICODIN) 5-325 MG tablet Take 1 tablet by mouth every 6 (six) hours as needed for severe pain (pain score 7-10) or moderate pain (pain score 4-6). 10/17/23   [place], Camellia PARAS, DO  insulin  aspart protamine  - aspart (NOVOLOG  70/30 FLEXPEN) (70-30) 100 UNIT/ML FlexPen Inject 20 Units into the skin 2 (two) times daily. Patient taking differently: Inject 15 Units into the skin 2 (two) times daily. 07/17/23   Tanda Bleacher, MD  midodrine  (PROAMATINE ) 10 MG tablet Take 1 tablet (10 mg total) by mouth 3 (three) times daily as needed for low blood pressure, SBP</=100. may take 30 min prior to dialysis and mid treatment as needed. 09/05/23     vancomycin  IVPB Inject 500 mg into the vein every Monday, Wednesday, and Friday with hemodialysis. Indication:  MRSA endocarditis Last Day of Therapy:   11/24/23 Labs - Once Weekly:  CBC/D, BMP, and vancomycin  trough, ESR and CRP Method of administration: Per hemodialysis protocol - to be given at the HD center 10/17/23 11/24/23  Luiz Channel, MD  bictegravir-emtricitabine -tenofovir  AF (BIKTARVY ) 50-200-25 MG TABS tablet Take 1 tablet by mouth daily. 01/02/22   Fleeta Kathie Jomarie LOISE, MD    Allergies: Patient has no known allergies.    Review of Systems  Constitutional:  Negative for chills, fatigue and fever.  Respiratory:  Negative for cough, chest tightness, shortness of breath and wheezing.   Cardiovascular:  Negative for chest pain and palpitations.  Gastrointestinal:  Negative for abdominal pain, constipation, diarrhea, nausea and vomiting.  Neurological:  Negative for dizziness, seizures, weakness, light-headedness, numbness and headaches.    Updated Vital Signs BP (!) 107/43   Pulse 81   Temp 98.2 F (36.8 C)   Resp 16   Ht 4' 11 (1.499 m)   Wt 47.2 kg   SpO2 99%   BMI 21.02 kg/m   Physical Exam Vitals and nursing Daniels reviewed.  Constitutional:      General: He is not in acute distress.    Appearance: Normal appearance.  HENT:     Head: Normocephalic and atraumatic.   Eyes:     Conjunctiva/sclera: Conjunctivae normal.    Cardiovascular:     Rate and Rhythm: Normal rate.  Pulmonary:     Effort: Pulmonary effort is normal. No respiratory distress.   Musculoskeletal:     Right lower leg:  No edema.     Left lower leg: No edema.     Comments: Permacath to right anterior chest.  Noninfectious appearing with no surrounding erythema nor warmth nor discharge.   Skin:    Coloration: Skin is not jaundiced or pale.   Neurological:     Mental Status: He is alert. Mental status is at baseline.     (all labs ordered are listed, but only abnormal results are displayed) Labs Reviewed  CBC WITH DIFFERENTIAL/PLATELET - Abnormal; Notable for the following components:      Result Value   RBC 3.11 (*)    Hemoglobin 10.0  (*)    HCT 30.6 (*)    All other components within normal limits  BASIC METABOLIC PANEL WITH GFR - Abnormal; Notable for the following components:   Sodium 130 (*)    Chloride 94 (*)    CO2 18 (*)    Glucose, Bld 222 (*)    BUN 36 (*)    Creatinine, Ser 11.35 (*)    GFR, Estimated 5 (*)    Anion gap 18 (*)    All other components within normal limits    EKG: None  Radiology: DG Chest 2 View Result Date: 11/10/2023 CLINICAL DATA:  Malfunctioning hemodialysis catheter.  Chest pain. EXAM: CHEST - 2 VIEW COMPARISON:  10/09/2023 and CT chest 02/28/2020. FINDINGS: Trachea is midline. Heart size normal. Right IJ dialysis catheter tip is at the SVC RA junction. Slight nodularity in the lower right hemithorax, as on 10/09/2023. Linear atelectasis or scarring in both lower lobes. No pleural fluid. IMPRESSION: 1. No acute findings. 2. Slight nodularity in the lower right hemithorax, as on 10/09/2023. Consider nonemergent CT chest without contrast in further evaluation. Electronically Signed   By: Newell Eke M.D.   On: 11/10/2023 13:20     Medications Ordered in the ED - No data to display  Clinical Course as of 11/10/23 1823  Mon Nov 10, 2023  1637 Hemoglobin(!): 10.0 Baseline 10.2-11.1 over past month [LB]    Clinical Course User Index [LB] Minnie Tinnie BRAVO, PA                                 Medical Decision Making Amount and/or Complexity of Data Reviewed Labs:  Decision-making details documented in ED Course.   Patient presents to the ED for concern of catheter not flushing, this involves an extensive number of treatment options, and is a complaint that carries with it a high risk of complications and morbidity.  The differential diagnosis includes catheter obstruction, infection, cellulitis, hematoma, catheter complication   Co morbidities that complicate the patient evaluation  Permacatheter used for HD   Additional history obtained:  Additional history obtained from  Nursing and Outside Medical Records   External records from outside source obtained and reviewed including triage RN Daniels, dialysis Daniels from today   Lab Tests:  I Ordered, and personally interpreted labs.  The pertinent results include:   Sodium 130 CBG 222 Potassium 3.8 Creatinine 11.35 (ESRD on HD) Anion gap 18 Hgb 10   Imaging Studies ordered:  I ordered imaging studies including CXR  I independently visualized and interpreted imaging which showed no acute findings.  Slight nodularity in the right lower hemithorax I agree with the radiologist interpretation   Cardiac Monitoring:  The patient was maintained on a cardiac monitor.  I personally viewed and interpreted the cardiac monitored which showed an  underlying rhythm of: NSR with no ST nor T wave abnormalities     Consultations Obtained:  I requested consultation with nephrology Dr. Tobie,  and discussed lab and imaging findings as well as pertinent plan - they recommend:  NPO at midnight DC and will call him tomorrow to set up appointment for catheter replacement No current requirement for emergent dialysis with reassuring labs   Problem List / ED Course:  HD catheter complication No complaints by patient Chest x-ray negative for fluid.  No pedal edema.  No signs of fluid overload Lab work unremarkable for elevated potassium.  Mildly increased anion gap.  No emergent dialysis required at this time We will have patient follow-up with nephrology tomorrow Also found incidental pulmonary nodule on chest x-ray as found on 10/09/2023.  I discussed this with patient to follow-up with primary care provider for further imaging as well as put it on DC paperwork   Reevaluation:  After the interventions noted above, I reevaluated the patient and found that they have :stayed the same     Dispostion:  After consideration of the diagnostic results and the patients response to treatment, I feel that the patent would  benefit from discharge.  Discussed ED workup, disposition, return to ED precautions with patient who expresses understanding agrees with plan.  All questions answered to their satisfaction.  They are agreeable to plan.  Discharge instructions provided on paperwork  Final diagnoses:  Complication associated with dialysis catheter    ED Discharge Orders     None          Minnie Tinnie BRAVO, PA 11/10/23 1823    Pamella Ozell LABOR, DO 11/15/23 0720

## 2023-11-11 ENCOUNTER — Other Ambulatory Visit (HOSPITAL_COMMUNITY): Payer: Self-pay | Admitting: Nephrology

## 2023-11-11 ENCOUNTER — Ambulatory Visit (HOSPITAL_COMMUNITY)
Admission: RE | Admit: 2023-11-11 | Discharge: 2023-11-11 | Disposition: A | Discharge status: Home / Self Care | Payer: Self-pay | Source: Ambulatory Visit | Attending: Nephrology | Admitting: Nephrology

## 2023-11-11 DIAGNOSIS — T829XXA Unspecified complication of cardiac and vascular prosthetic device, implant and graft, initial encounter: Secondary | ICD-10-CM | POA: Insufficient documentation

## 2023-11-11 DIAGNOSIS — N186 End stage renal disease: Secondary | ICD-10-CM

## 2023-11-11 HISTORY — PX: IR FLUORO GUIDE CV LINE RIGHT: IMG2283

## 2023-11-11 MED ORDER — IOHEXOL 300 MG/ML  SOLN
50.0000 mL | Freq: Once | INTRAMUSCULAR | Status: AC | PRN
  Administered 2023-11-11: 15 mL via INTRAVENOUS

## 2023-11-11 MED ORDER — LIDOCAINE-EPINEPHRINE 1 %-1:100000 IJ SOLN
INTRAMUSCULAR | Status: AC
  Filled 2023-11-11: qty 1

## 2023-11-11 MED ORDER — CHLORHEXIDINE GLUCONATE 4 % EX SOLN
CUTANEOUS | Status: AC
Start: 2023-11-11 — End: 2023-11-11
  Filled 2023-11-11: qty 15

## 2023-11-11 MED ORDER — CEFAZOLIN SODIUM-DEXTROSE 2-4 GM/100ML-% IV SOLN
2.0000 g | Freq: Once | INTRAVENOUS | Status: AC
  Administered 2023-11-11: 2 g via INTRAVENOUS

## 2023-11-11 MED ORDER — CEFAZOLIN SODIUM-DEXTROSE 2-4 GM/100ML-% IV SOLN
INTRAVENOUS | Status: AC
Start: 2023-11-11 — End: 2023-11-11
  Filled 2023-11-11: qty 100

## 2023-11-11 MED ORDER — HEPARIN SODIUM (PORCINE) 1000 UNIT/ML IJ SOLN
INTRAMUSCULAR | Status: AC
  Filled 2023-11-11: qty 10

## 2023-11-11 NOTE — Procedures (Signed)
 Interventional Radiology Procedure Note  Procedure: HD line exchange  Complications: None  Estimated Blood Loss: < 10 mL  Findings: Exchange and upsize RIJ tunneled HD line.  Cordella DELENA Banner, MD

## 2023-11-12 ENCOUNTER — Ambulatory Visit: Payer: Self-pay | Admitting: Infectious Diseases

## 2023-11-19 ENCOUNTER — Other Ambulatory Visit: Payer: Self-pay

## 2023-11-24 ENCOUNTER — Other Ambulatory Visit: Payer: Self-pay

## 2023-11-28 ENCOUNTER — Other Ambulatory Visit: Payer: Self-pay

## 2023-12-14 ENCOUNTER — Emergency Department (HOSPITAL_COMMUNITY): Payer: MEDICAID

## 2023-12-14 ENCOUNTER — Encounter (HOSPITAL_COMMUNITY): Payer: Self-pay

## 2023-12-14 ENCOUNTER — Other Ambulatory Visit: Payer: Self-pay

## 2023-12-14 ENCOUNTER — Inpatient Hospital Stay (HOSPITAL_COMMUNITY): Admission: EM | Admit: 2023-12-14 | Discharge: 2024-03-13 | DRG: 974 | Disposition: E | Payer: MEDICAID

## 2023-12-14 DIAGNOSIS — R64 Cachexia: Secondary | ICD-10-CM | POA: Diagnosis present

## 2023-12-14 DIAGNOSIS — M4646 Discitis, unspecified, lumbar region: Secondary | ICD-10-CM | POA: Diagnosis present

## 2023-12-14 DIAGNOSIS — Z66 Do not resuscitate: Secondary | ICD-10-CM | POA: Diagnosis not present

## 2023-12-14 DIAGNOSIS — G061 Intraspinal abscess and granuloma: Secondary | ICD-10-CM | POA: Diagnosis present

## 2023-12-14 DIAGNOSIS — F419 Anxiety disorder, unspecified: Secondary | ICD-10-CM | POA: Diagnosis present

## 2023-12-14 DIAGNOSIS — G062 Extradural and subdural abscess, unspecified: Secondary | ICD-10-CM

## 2023-12-14 DIAGNOSIS — J690 Pneumonitis due to inhalation of food and vomit: Secondary | ICD-10-CM | POA: Diagnosis not present

## 2023-12-14 DIAGNOSIS — Z5320 Procedure and treatment not carried out because of patient's decision for unspecified reasons: Secondary | ICD-10-CM | POA: Diagnosis present

## 2023-12-14 DIAGNOSIS — Z9911 Dependence on respirator [ventilator] status: Secondary | ICD-10-CM

## 2023-12-14 DIAGNOSIS — I953 Hypotension of hemodialysis: Secondary | ICD-10-CM | POA: Diagnosis not present

## 2023-12-14 DIAGNOSIS — E1151 Type 2 diabetes mellitus with diabetic peripheral angiopathy without gangrene: Secondary | ICD-10-CM | POA: Diagnosis present

## 2023-12-14 DIAGNOSIS — M869 Osteomyelitis, unspecified: Secondary | ICD-10-CM

## 2023-12-14 DIAGNOSIS — Z603 Acculturation difficulty: Secondary | ICD-10-CM | POA: Diagnosis present

## 2023-12-14 DIAGNOSIS — M8608 Acute hematogenous osteomyelitis, other sites: Secondary | ICD-10-CM

## 2023-12-14 DIAGNOSIS — F22 Delusional disorders: Secondary | ICD-10-CM | POA: Diagnosis not present

## 2023-12-14 DIAGNOSIS — E1165 Type 2 diabetes mellitus with hyperglycemia: Secondary | ICD-10-CM | POA: Diagnosis not present

## 2023-12-14 DIAGNOSIS — Z89022 Acquired absence of left finger(s): Secondary | ICD-10-CM

## 2023-12-14 DIAGNOSIS — Z89511 Acquired absence of right leg below knee: Secondary | ICD-10-CM

## 2023-12-14 DIAGNOSIS — Z681 Body mass index (BMI) 19 or less, adult: Secondary | ICD-10-CM

## 2023-12-14 DIAGNOSIS — D709 Neutropenia, unspecified: Secondary | ICD-10-CM | POA: Diagnosis not present

## 2023-12-14 DIAGNOSIS — E1169 Type 2 diabetes mellitus with other specified complication: Secondary | ICD-10-CM | POA: Diagnosis present

## 2023-12-14 DIAGNOSIS — Z833 Family history of diabetes mellitus: Secondary | ICD-10-CM

## 2023-12-14 DIAGNOSIS — B182 Chronic viral hepatitis C: Secondary | ICD-10-CM | POA: Diagnosis present

## 2023-12-14 DIAGNOSIS — J189 Pneumonia, unspecified organism: Secondary | ICD-10-CM | POA: Diagnosis not present

## 2023-12-14 DIAGNOSIS — E876 Hypokalemia: Secondary | ICD-10-CM | POA: Diagnosis present

## 2023-12-14 DIAGNOSIS — M48061 Spinal stenosis, lumbar region without neurogenic claudication: Secondary | ICD-10-CM | POA: Diagnosis present

## 2023-12-14 DIAGNOSIS — Z8614 Personal history of Methicillin resistant Staphylococcus aureus infection: Secondary | ICD-10-CM | POA: Diagnosis present

## 2023-12-14 DIAGNOSIS — Z91199 Patient's noncompliance with other medical treatment and regimen due to unspecified reason: Secondary | ICD-10-CM

## 2023-12-14 DIAGNOSIS — L8990 Pressure ulcer of unspecified site, unspecified stage: Secondary | ICD-10-CM | POA: Diagnosis present

## 2023-12-14 DIAGNOSIS — D65 Disseminated intravascular coagulation [defibrination syndrome]: Secondary | ICD-10-CM | POA: Diagnosis not present

## 2023-12-14 DIAGNOSIS — Z5900 Homelessness unspecified: Secondary | ICD-10-CM

## 2023-12-14 DIAGNOSIS — Z79899 Other long term (current) drug therapy: Secondary | ICD-10-CM

## 2023-12-14 DIAGNOSIS — E43 Unspecified severe protein-calorie malnutrition: Secondary | ICD-10-CM | POA: Diagnosis present

## 2023-12-14 DIAGNOSIS — A4102 Sepsis due to Methicillin resistant Staphylococcus aureus: Principal | ICD-10-CM | POA: Diagnosis present

## 2023-12-14 DIAGNOSIS — R627 Adult failure to thrive: Secondary | ICD-10-CM | POA: Diagnosis present

## 2023-12-14 DIAGNOSIS — M6281 Muscle weakness (generalized): Secondary | ICD-10-CM | POA: Diagnosis present

## 2023-12-14 DIAGNOSIS — Z794 Long term (current) use of insulin: Secondary | ICD-10-CM

## 2023-12-14 DIAGNOSIS — J80 Acute respiratory distress syndrome: Secondary | ICD-10-CM | POA: Diagnosis not present

## 2023-12-14 DIAGNOSIS — N186 End stage renal disease: Secondary | ICD-10-CM | POA: Diagnosis present

## 2023-12-14 DIAGNOSIS — M4620 Osteomyelitis of vertebra, site unspecified: Secondary | ICD-10-CM

## 2023-12-14 DIAGNOSIS — R6521 Severe sepsis with septic shock: Secondary | ICD-10-CM | POA: Diagnosis present

## 2023-12-14 DIAGNOSIS — Z992 Dependence on renal dialysis: Secondary | ICD-10-CM

## 2023-12-14 DIAGNOSIS — I9589 Other hypotension: Secondary | ICD-10-CM | POA: Diagnosis present

## 2023-12-14 DIAGNOSIS — E1140 Type 2 diabetes mellitus with diabetic neuropathy, unspecified: Secondary | ICD-10-CM | POA: Diagnosis present

## 2023-12-14 DIAGNOSIS — E1122 Type 2 diabetes mellitus with diabetic chronic kidney disease: Secondary | ICD-10-CM | POA: Diagnosis present

## 2023-12-14 DIAGNOSIS — M6008 Infective myositis, other site: Secondary | ICD-10-CM | POA: Diagnosis present

## 2023-12-14 DIAGNOSIS — K6812 Psoas muscle abscess: Principal | ICD-10-CM | POA: Diagnosis present

## 2023-12-14 DIAGNOSIS — A419 Sepsis, unspecified organism: Secondary | ICD-10-CM | POA: Diagnosis present

## 2023-12-14 DIAGNOSIS — Z515 Encounter for palliative care: Secondary | ICD-10-CM

## 2023-12-14 DIAGNOSIS — E11649 Type 2 diabetes mellitus with hypoglycemia without coma: Secondary | ICD-10-CM | POA: Diagnosis not present

## 2023-12-14 DIAGNOSIS — R34 Anuria and oliguria: Secondary | ICD-10-CM | POA: Diagnosis not present

## 2023-12-14 DIAGNOSIS — B001 Herpesviral vesicular dermatitis: Secondary | ICD-10-CM | POA: Diagnosis present

## 2023-12-14 DIAGNOSIS — Z21 Asymptomatic human immunodeficiency virus [HIV] infection status: Secondary | ICD-10-CM | POA: Diagnosis present

## 2023-12-14 DIAGNOSIS — R54 Age-related physical debility: Secondary | ICD-10-CM | POA: Diagnosis present

## 2023-12-14 DIAGNOSIS — M4626 Osteomyelitis of vertebra, lumbar region: Secondary | ICD-10-CM | POA: Diagnosis present

## 2023-12-14 DIAGNOSIS — D631 Anemia in chronic kidney disease: Secondary | ICD-10-CM | POA: Diagnosis present

## 2023-12-14 DIAGNOSIS — K921 Melena: Secondary | ICD-10-CM | POA: Diagnosis not present

## 2023-12-14 DIAGNOSIS — I509 Heart failure, unspecified: Secondary | ICD-10-CM | POA: Diagnosis present

## 2023-12-14 DIAGNOSIS — D649 Anemia, unspecified: Secondary | ICD-10-CM | POA: Diagnosis present

## 2023-12-14 DIAGNOSIS — E871 Hypo-osmolality and hyponatremia: Secondary | ICD-10-CM | POA: Diagnosis not present

## 2023-12-14 DIAGNOSIS — Z89512 Acquired absence of left leg below knee: Secondary | ICD-10-CM

## 2023-12-14 DIAGNOSIS — B20 Human immunodeficiency virus [HIV] disease: Secondary | ICD-10-CM | POA: Diagnosis present

## 2023-12-14 DIAGNOSIS — H189 Unspecified disorder of cornea: Secondary | ICD-10-CM | POA: Diagnosis present

## 2023-12-14 DIAGNOSIS — Y95 Nosocomial condition: Secondary | ICD-10-CM | POA: Diagnosis not present

## 2023-12-14 LAB — CBC WITH DIFFERENTIAL/PLATELET
Abs Immature Granulocytes: 0.03 K/uL (ref 0.00–0.07)
Basophils Absolute: 0.1 K/uL (ref 0.0–0.1)
Basophils Relative: 1 %
Eosinophils Absolute: 0.2 K/uL (ref 0.0–0.5)
Eosinophils Relative: 2 %
HCT: 30.9 % — ABNORMAL LOW (ref 39.0–52.0)
Hemoglobin: 10 g/dL — ABNORMAL LOW (ref 13.0–17.0)
Immature Granulocytes: 0 %
Lymphocytes Relative: 15 %
Lymphs Abs: 1.5 K/uL (ref 0.7–4.0)
MCH: 32.3 pg (ref 26.0–34.0)
MCHC: 32.4 g/dL (ref 30.0–36.0)
MCV: 99.7 fL (ref 80.0–100.0)
Monocytes Absolute: 0.6 K/uL (ref 0.1–1.0)
Monocytes Relative: 6 %
Neutro Abs: 7.7 K/uL (ref 1.7–7.7)
Neutrophils Relative %: 76 %
Platelets: 249 K/uL (ref 150–400)
RBC: 3.1 MIL/uL — ABNORMAL LOW (ref 4.22–5.81)
RDW: 15.2 % (ref 11.5–15.5)
WBC: 10.1 K/uL (ref 4.0–10.5)
nRBC: 0 % (ref 0.0–0.2)

## 2023-12-14 LAB — CBG MONITORING, ED: Glucose-Capillary: 81 mg/dL (ref 70–99)

## 2023-12-14 NOTE — ED Provider Notes (Signed)
 [facility]EMERGENCY DEPT [facility] Emergency Department Provider Note MRN:  [id number]  Arrival date & time: 12/16/23     Chief Complaint   Hip Pain   History of Present Illness   Alvin Daniels is a 53 y.o. year-old male presents to the ED with chief complaint of right hip pain.  He is accompanied by his family, who request to interpret.   Patient states that earlier today he had pain in his low back and hip.  He states that he lost consciousness, but his family caught him before he fell.  Family reports that he has had several episodes of LOC recently.  He denies any CP or SOB.  He is on dialysis and was last dialyzed on Friday and is due tomorrow.  He denies fevers or chills.  He states that his only complaint at present is right hip pain.  Recent admission for sepsis and endocarditis.  History provided by patient.   Review of Systems  Pertinent positive and negative review of systems noted in HPI.    Physical Exam   Vitals:   12/15/23 2300 12/16/23 0052  BP: 92/73 (!) 92/56  Pulse:  (!) 105  Resp:  16  Temp:  (!) 97.5 F (36.4 C)  SpO2:  97%    CONSTITUTIONAL:  chronically ill-appearing, NAD NEURO:  Alert and oriented x 3, CN 3-12 grossly intact EYES:  eyes equal and reactive ENT/NECK:  Supple, no stridor  CARDIO:  normal rate, regular rhythm, appears well-perfused  PULM:  No respiratory distress, CTAB GI/GU:  non-distended, no focal tenderness MSK/SPINE:  No gross deformities, no edema, bilateral BKAs, no obvious deformity of the right hip or low back SKIN:  no rash, atraumatic   *Additional and/or pertinent findings included in MDM below  Diagnostic and Interventional Summary    EKG Interpretation Date/Time:  Sunday December 14 2023 23:29:24 EDT Ventricular Rate:  79 PR Interval:  138 QRS Duration:  84 QT Interval:  431 QTC Calculation: 495 R Axis:   92  Text Interpretation: Sinus rhythm Borderline right axis deviation Confirmed by  Kommor, [place] ([id number]) on 12/15/2023 2:59:57 AM       Labs Reviewed  BASIC METABOLIC PANEL WITH GFR - Abnormal; Notable for the following components:      Result Value   Sodium 131 (*)    Chloride 94 (*)    BUN 37 (*)    Creatinine, Ser 8.38 (*)    GFR, Estimated 7 (*)    All other components within normal limits  CBC WITH DIFFERENTIAL/PLATELET - Abnormal; Notable for the following components:   RBC 3.10 (*)    Hemoglobin 10.0 (*)    HCT 30.9 (*)    All other components within normal limits  SEDIMENTATION RATE - Abnormal; Notable for the following components:   Sed Rate 133 (*)    All other components within normal limits  C-REACTIVE PROTEIN - Abnormal; Notable for the following components:   CRP 15.2 (*)    All other components within normal limits  COMPREHENSIVE METABOLIC PANEL WITH GFR - Abnormal; Notable for the following components:   Sodium 134 (*)    Potassium 3.4 (*)    Chloride 93 (*)    Glucose, Bld 60 (*)    BUN 39 (*)    Creatinine, Ser 9.06 (*)    Total Protein 10.3 (*)    Albumin  2.7 (*)    Total Bilirubin 1.8 (*)    GFR, Estimated 6 (*)    Anion gap  18 (*)    All other components within normal limits  CULTURE, BLOOD (ROUTINE X 2)  CULTURE, BLOOD (ROUTINE X 2)  PROTIME-INR  HEPATITIS B SURFACE ANTIGEN  HEPATITIS B SURFACE ANTIBODY, QUANTITATIVE  CBG MONITORING, ED  I-STAT CG4 LACTIC ACID, ED  I-STAT CG4 LACTIC ACID, ED  TROPONIN I (HIGH SENSITIVITY)  TROPONIN I (HIGH SENSITIVITY)    MR SACRUM SI JOINTS W WO CONTRAST  Final Result    DG Hip Unilat W or Wo Pelvis 2-3 Views Right  Final Result      Medications  heparin  injection 5,000 Units (5,000 Units Subcutaneous Given 12/16/23 0113)  Chlorhexidine  Gluconate Cloth 2 % PADS 6 each (has no administration in time range)  bictegravir-emtricitabine -tenofovir  AF (BIKTARVY ) 50-200-25 MG per tablet 1 tablet (1 tablet Oral Given 12/15/23 1420)  gabapentin  (NEURONTIN ) capsule 100 mg (100 mg Oral Given 12/16/23  0115)  midodrine  (PROAMATINE ) tablet 10 mg (has no administration in time range)  acetaminophen  (TYLENOL ) tablet 1,000 mg (1,000 mg Oral Given 12/16/23 0113)  oxyCODONE  (Oxy IR/ROXICODONE ) immediate release tablet 5 mg (5 mg Oral Given 12/15/23 2030)  sodium chloride  flush (NS) 0.9 % injection 10-40 mL (10 mLs Intracatheter Given 12/15/23 1447)  sodium chloride  flush (NS) 0.9 % injection 10-40 mL (has no administration in time range)  methocarbamol  (ROBAXIN ) tablet 500 mg (500 mg Oral Given 12/15/23 1538)  ceFEPIme  (MAXIPIME ) 1 g in sodium chloride  0.9 % 100 mL IVPB (has no administration in time range)  heparin  sodium (porcine) injection 4,000 Units (has no administration in time range)  vancomycin  (VANCOREADY) IVPB 500 mg/100 mL (500 mg Intravenous New Bag/Given 12/16/23 0225)  oxyCODONE -acetaminophen  (PERCOCET/ROXICET) 5-325 MG per tablet 2 tablet (2 tablets Oral Given 12/15/23 0324)  lactated ringers  bolus 1,000 mL (0 mLs Intravenous Stopped 12/15/23 0729)  fentaNYL  (SUBLIMAZE ) injection 50 mcg (50 mcg Intravenous Given 12/15/23 0845)  gadobutrol  (GADAVIST ) 1 MMOL/ML injection 5 mL (5 mLs Intravenous Contrast Given 12/15/23 0930)  ceFEPIme  (MAXIPIME ) 2 g in sodium chloride  0.9 % 100 mL IVPB (0 g Intravenous Stopped 12/15/23 1130)  vancomycin  (VANCOCIN ) IVPB 1000 mg/200 mL premix (1,000 mg Intravenous New Bag/Given 12/15/23 1054)  oxyCODONE -acetaminophen  (PERCOCET/ROXICET) 5-325 MG per tablet 2 tablet (2 tablets Oral Given 12/15/23 1302)     Procedures  /  Critical Care Procedures  ED Course and Medical Decision Making  I have reviewed the triage vital signs, the nursing notes, and pertinent available records from the EMR.  Social Determinants Affecting Complexity of Care: Patient has decreased access to medical care.   ED Course: Clinical Course as of 12/16/23 0257  Mon Dec 15, 2023  0626 PMH endocarditis, HIV+, ESRD on HD MWF, recent sepsis admission. CC R hip/back pain. Cannot ambulate. MR and then  ortho. Abx after speaking w ortho.  [AG]  0831 Will need dialysis today [AG]  1027 Consult ortho placed [AG]  1028 Nephrology for dialysis consulted [AG]  1035 Kocher Criteria: 2 (40% probability- recommend ortho consult) [AG]  1041 MR findings: osteomyelitis at L4-5 with associated paraspinal inflammatory changes and right psoas abscess   [AG]  1054 Ortho will follow PRN, Nephrology to follow [AG]    Clinical Course User Index [AG] Nada Chroman, DO    Medical Decision Making Patient here complaining of right hip pain.  This was recently evaluated during his last hospital stay.  From recent hospitalization/hospitalist note. Right hip pain MR right hip with generalized soft tissue edema throughout the pelvis with mild involvement of the gluteus musculature no  focal fluid collections, no evidence of osteomyelitis or septic arthritis, no acute osseous findings.  MR SI joint with mild-moderate fluid deep to the bilateral iliac us  muscles, no cortical erosion or edema with the adjacent iliac bones, no evidence of osteomyelitis or sacroiliitis, nonspecific generalized edema within the bilateral gluteus minimus greater than gluteus medius and maximus muscles.    Patient was unable to ambulate.  Due to his persistent pain, will add CRP and sed rate.  I discussed case with Dr. Albertina, who agrees with this plan.  CRP and sed rate are elevated, will obtain advanced imaging of the SI joint, due to the mild to moderate fluid collection seen in the past.  Patient had mild hypotension.  He was given a liter of fluid.  He remains afebrile.  Will add on lactic and blood cultures.  Patient signed out to oncoming team, who will continue care.  Anticipate admission.  Amount and/or Complexity of Data Reviewed Labs: ordered. Radiology: ordered.  Risk Prescription drug management. Decision regarding hospitalization.         Consultants:    Treatment and Plan: Signed out to oncoming  team, who will continue care.    Final Clinical Impressions(s) / ED Diagnoses     ICD-10-CM   1. 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 Acute hematogenous osteomyelitis of other site [facility])  M86.08       ED Discharge Orders     None         Discharge Instructions Discussed with and Provided to Patient:   Discharge Instructions   None      Vicky Charleston, PA-C 12/16/23 0257    Albertina Dixon, MD 12/16/23 [phone]

## 2023-12-14 NOTE — ED Triage Notes (Addendum)
 Pt BIB EMS with c/o right hip pain after a ground level fall that happened tonight. Pt denies hitting his hit during fall. Pt does not take blood thinners. Pt is a HD on MWF. Pt has not missed any treatments. Pt has bilateral BKA's.     162/84 HR 82 99% on RA CBG 71

## 2023-12-14 NOTE — ED Notes (Signed)
 This nurse called CCMD to have patient monitored.

## 2023-12-15 ENCOUNTER — Emergency Department (HOSPITAL_COMMUNITY): Payer: MEDICAID

## 2023-12-15 DIAGNOSIS — K6812 Psoas muscle abscess: Principal | ICD-10-CM | POA: Diagnosis present

## 2023-12-15 LAB — COMPREHENSIVE METABOLIC PANEL WITH GFR
ALT: 9 U/L (ref 0–44)
AST: 19 U/L (ref 15–41)
Albumin: 2.7 g/dL — ABNORMAL LOW (ref 3.5–5.0)
Alkaline Phosphatase: 74 U/L (ref 38–126)
Anion gap: 18 — ABNORMAL HIGH (ref 5–15)
BUN: 39 mg/dL — ABNORMAL HIGH (ref 6–20)
CO2: 23 mmol/L (ref 22–32)
Calcium: 9.2 mg/dL (ref 8.9–10.3)
Chloride: 93 mmol/L — ABNORMAL LOW (ref 98–111)
Creatinine, Ser: 9.06 mg/dL — ABNORMAL HIGH (ref 0.61–1.24)
GFR, Estimated: 6 mL/min — ABNORMAL LOW (ref >60)
Glucose, Bld: 60 mg/dL — ABNORMAL LOW (ref 70–99)
Potassium: 3.4 mmol/L — ABNORMAL LOW (ref 3.5–5.1)
Sodium: 134 mmol/L — ABNORMAL LOW (ref 135–145)
Total Bilirubin: 1.8 mg/dL — ABNORMAL HIGH (ref 0.0–1.2)
Total Protein: 10.3 g/dL — ABNORMAL HIGH (ref 6.5–8.1)

## 2023-12-15 LAB — C-REACTIVE PROTEIN: CRP: 15.2 mg/dL — ABNORMAL HIGH (ref <1.0)

## 2023-12-15 LAB — BASIC METABOLIC PANEL WITH GFR
Anion gap: 14 (ref 5–15)
BUN: 37 mg/dL — ABNORMAL HIGH (ref 6–20)
CO2: 23 mmol/L (ref 22–32)
Calcium: 9 mg/dL (ref 8.9–10.3)
Chloride: 94 mmol/L — ABNORMAL LOW (ref 98–111)
Creatinine, Ser: 8.38 mg/dL — ABNORMAL HIGH (ref 0.61–1.24)
GFR, Estimated: 7 mL/min — ABNORMAL LOW (ref >60)
Glucose, Bld: 85 mg/dL (ref 70–99)
Potassium: 3.7 mmol/L (ref 3.5–5.1)
Sodium: 131 mmol/L — ABNORMAL LOW (ref 135–145)

## 2023-12-15 LAB — TROPONIN I (HIGH SENSITIVITY)
Troponin I (High Sensitivity): 14 ng/L
Troponin I (High Sensitivity): 14 ng/L (ref <18)

## 2023-12-15 LAB — I-STAT CG4 LACTIC ACID, ED
Lactic Acid, Venous: 0.9 mmol/L (ref 0.5–1.9)
Lactic Acid, Venous: 1 mmol/L (ref 0.5–1.9)

## 2023-12-15 LAB — PROTIME-INR
INR: 1.1 (ref 0.8–1.2)
Prothrombin Time: 14.6 s (ref 11.4–15.2)

## 2023-12-15 LAB — SEDIMENTATION RATE: Sed Rate: 133 mm/h — ABNORMAL HIGH (ref 0–16)

## 2023-12-15 LAB — HEPATITIS B SURFACE ANTIGEN: Hepatitis B Surface Ag: NONREACTIVE

## 2023-12-15 MED ORDER — VANCOMYCIN VARIABLE DOSE PER UNSTABLE RENAL FUNCTION (PHARMACIST DOSING): Status: DC

## 2023-12-15 MED ORDER — LACTATED RINGERS IV BOLUS
1000.0000 mL | Freq: Once | INTRAVENOUS | Status: AC
  Administered 2023-12-15: 1000 mL via INTRAVENOUS

## 2023-12-15 MED ORDER — GABAPENTIN 100 MG PO CAPS
100.0000 mg | ORAL_CAPSULE | Freq: Three times a day (TID) | ORAL | Status: DC
  Administered 2023-12-15 – 2024-01-08 (×78): 100 mg via ORAL
  Filled 2023-12-15 (×69): qty 1

## 2023-12-15 MED ORDER — SODIUM CHLORIDE 0.9% FLUSH: 10.0000 mL | INTRAVENOUS | Status: DC | PRN

## 2023-12-15 MED ORDER — SODIUM CHLORIDE 0.9 % IV SOLN
2.0000 g | Freq: Once | INTRAVENOUS | Status: AC
  Administered 2023-12-15: 2 g via INTRAVENOUS
  Filled 2023-12-15: qty 12.5

## 2023-12-15 MED ORDER — METHOCARBAMOL 500 MG PO TABS
500.0000 mg | ORAL_TABLET | Freq: Three times a day (TID) | ORAL | Status: DC
  Administered 2023-12-15 – 2024-02-21 (×197): 500 mg via ORAL
  Filled 2023-12-15 (×193): qty 1

## 2023-12-15 MED ORDER — OXYCODONE HCL 5 MG PO TABS
5.0000 mg | ORAL_TABLET | ORAL | Status: DC | PRN
  Administered 2023-12-15 – 2023-12-20 (×13): 5 mg via ORAL
  Filled 2023-12-15 (×11): qty 1

## 2023-12-15 MED ORDER — FENTANYL CITRATE PF 50 MCG/ML IJ SOSY
50.0000 ug | PREFILLED_SYRINGE | Freq: Once | INTRAMUSCULAR | Status: AC
  Administered 2023-12-15: 50 ug via INTRAVENOUS
  Filled 2023-12-15: qty 1

## 2023-12-15 MED ORDER — VANCOMYCIN HCL IN DEXTROSE 1-5 GM/200ML-% IV SOLN
1000.0000 mg | Freq: Once | INTRAVENOUS | Status: AC
  Administered 2023-12-15: 1000 mg via INTRAVENOUS
  Filled 2023-12-15: qty 200

## 2023-12-15 MED ORDER — OXYCODONE-ACETAMINOPHEN 5-325 MG PO TABS
2.0000 | ORAL_TABLET | Freq: Once | ORAL | Status: AC
  Administered 2023-12-15: 2 via ORAL
  Filled 2023-12-15: qty 2

## 2023-12-15 MED ORDER — BICTEGRAVIR-EMTRICITAB-TENOFOV 50-200-25 MG PO TABS
1.0000 | ORAL_TABLET | Freq: Every day | ORAL | Status: DC
  Administered 2023-12-15 – 2024-01-25 (×45): 1 via ORAL
  Filled 2023-12-15 (×44): qty 1

## 2023-12-15 MED ORDER — HEPARIN SODIUM (PORCINE) 1000 UNIT/ML IJ SOLN: 4000.0000 [IU] | Freq: Once | INTRAMUSCULAR | Status: DC

## 2023-12-15 MED ORDER — MIDODRINE HCL 5 MG PO TABS
10.0000 mg | ORAL_TABLET | Freq: Three times a day (TID) | ORAL | Status: DC | PRN
  Administered 2023-12-16 – 2023-12-25 (×9): 10 mg via ORAL
  Filled 2023-12-15 (×8): qty 2

## 2023-12-15 MED ORDER — GADOBUTROL 1 MMOL/ML IV SOLN
5.0000 mL | Freq: Once | INTRAVENOUS | Status: AC | PRN
  Administered 2023-12-15: 5 mL via INTRAVENOUS

## 2023-12-15 MED ORDER — ACETAMINOPHEN 500 MG PO TABS
1000.0000 mg | ORAL_TABLET | Freq: Three times a day (TID) | ORAL | Status: DC
  Administered 2023-12-15 – 2024-01-31 (×122): 1000 mg via ORAL
  Administered 2024-01-31: 975 mg via ORAL
  Administered 2024-02-01 – 2024-02-21 (×57): 1000 mg via ORAL
  Filled 2023-12-15 (×189): qty 2

## 2023-12-15 MED ORDER — HEPARIN SODIUM (PORCINE) 1000 UNIT/ML IJ SOLN
INTRAMUSCULAR | Status: AC
  Filled 2023-12-15: qty 4

## 2023-12-15 MED ORDER — HEPARIN SODIUM (PORCINE) 5000 UNIT/ML IJ SOLN
5000.0000 [IU] | Freq: Three times a day (TID) | INTRAMUSCULAR | Status: DC
  Administered 2023-12-15 – 2024-02-21 (×198): 5000 [IU] via SUBCUTANEOUS
  Filled 2023-12-15 (×192): qty 1

## 2023-12-15 MED ORDER — SODIUM CHLORIDE 0.9 % IV SOLN
1.0000 g | INTRAVENOUS | Status: AC
  Administered 2023-12-16 – 2023-12-22 (×8): 1 g via INTRAVENOUS
  Filled 2023-12-15 (×8): qty 10

## 2023-12-15 MED ORDER — CHLORHEXIDINE GLUCONATE CLOTH 2 % EX PADS
6.0000 | MEDICATED_PAD | Freq: Every day | CUTANEOUS | Status: DC
  Administered 2023-12-16: 6 via TOPICAL

## 2023-12-15 MED ORDER — HYDROCODONE-ACETAMINOPHEN 5-325 MG PO TABS: 1.0000 | ORAL_TABLET | Freq: Four times a day (QID) | ORAL | Status: DC | PRN

## 2023-12-15 MED ORDER — OXYCODONE HCL 5 MG PO TABS
ORAL_TABLET | ORAL | Status: AC
  Filled 2023-12-15: qty 1

## 2023-12-15 MED ORDER — SODIUM CHLORIDE 0.9% FLUSH
10.0000 mL | Freq: Two times a day (BID) | INTRAVENOUS | Status: DC
  Administered 2023-12-15 – 2024-01-01 (×27): 10 mL
  Administered 2024-01-02: 20 mL
  Administered 2024-01-02 – 2024-01-08 (×10): 10 mL

## 2023-12-15 MED ORDER — VANCOMYCIN HCL 500 MG/100ML IV SOLN
500.0000 mg | INTRAVENOUS | Status: DC
  Administered 2023-12-16 – 2023-12-25 (×6): 500 mg via INTRAVENOUS
  Filled 2023-12-15 (×5): qty 100

## 2023-12-15 NOTE — ED Notes (Signed)
 Asked patient if he still makes urine for UA due to dialysis, he advised that he does not.

## 2023-12-15 NOTE — Progress Notes (Signed)
 Pharmacy Antibiotic Note  Alvin Daniels is a 53 y.o. male admitted on 12/14/2023 with osteomyelitis.  Pharmacy has been consulted for vancomycin  and cefepime  dosing. Patient received loading dose of vancomycin  1000mg  in ED as well as 2g dose of cefepime . WBC is WNL and patient is afebrile.   Plan: Give IV vancomycin  500 mg qHD session pending nephrology evaluation. Give IV cefepime  1g Q24H starting tomorrow.  Monitor cultures and s/sx of infection.  Height: 4' 11 (149.9 cm) Weight: 47.6 kg (105 lb) IBW/kg (Calculated) : 47.7  Temp (24hrs), Avg:98 F (36.7 C), Min:97.5 F (36.4 C), Max:98.9 F (37.2 C)  Recent Labs  Lab 12/14/23 2332 12/15/23 0608 12/15/23 0614 12/15/23 0741  WBC 10.1  --   --   --   CREATININE 8.38* 9.06*  --   --   LATICACIDVEN  --   --  0.9 1.0    ESRD on HD, receives HD MWF outpatient.  No Known Allergies  Antimicrobials this admission: Cefepime  8/4 >> Vancomycin  8/4 >>  Dose adjustments this admission: N/A  Microbiology results: Previous MRSA bacteremia May 2025.  8/4 2x Bcx: in process  Thank you for allowing pharmacy to be a part of this patient's care.  Maurilio Patten, PharmD PGY1 Pharmacy Resident [facility] 12/15/2023 1:27 PM

## 2023-12-15 NOTE — ED Provider Notes (Signed)
 Handoff taken from prior provider team.  53 year old male past medical history of HIV, ESRD on hemodialysis Monday Wednesday Friday, endocarditis with recent admission for sepsis who presents emergency department for right hip pain and inability to ambulate.  Plan at handoff: - Follow-up MR imaging - Pending results of MR imaging, consider orthopedic consult and antibiotics based upon orthopedic recommendations     Physical Exam  BP 98/68 (BP Location: Right Arm)   Pulse 75   Temp 98.9 F (37.2 C) (Temporal)   Resp 10   Ht 4' 11 (1.499 m)   Wt 47.6 kg   SpO2 100%   BMI 21.21 kg/m   Physical Exam Vitals reviewed.  Constitutional:      General: He is not in acute distress.    Appearance: He is ill-appearing.     Comments: Chronically ill-appearing  HENT:     Head: Normocephalic and atraumatic.  Cardiovascular:     Rate and Rhythm: Normal rate and regular rhythm.     Pulses:          Radial pulses are 2+ on the right side and 2+ on the left side.     Heart sounds: Normal heart sounds. No murmur heard. Pulmonary:     Effort: Pulmonary effort is normal.     Breath sounds: Normal breath sounds.  Chest:    Abdominal:     General: There is no distension.     Palpations: Abdomen is soft.     Tenderness: There is no abdominal tenderness.  Musculoskeletal:     Comments: Bilateral lower extremities with BKA.  Full active range of motion of hips not limited by pain  No midline cervical, thoracic or lumbar midline tenderness  Right sacral tenderness to palpation without overlying erythema, edema or purulent drainage  Neurological:     Mental Status: He is alert.  Psychiatric:        Behavior: Behavior is cooperative.     Procedures  Procedures  ED Course / MDM   Clinical Course as of 12/15/23 1531  Mon Dec 15, 2023  0626 PMH endocarditis, HIV+, ESRD on HD MWF, recent sepsis admission. CC R hip/back pain. Cannot ambulate. MR and then ortho. Abx after speaking w  ortho.  [AG]  0831 Will need dialysis today [AG]  1027 Consult ortho placed [AG]  1028 Nephrology for dialysis consulted [AG]  1035 Kocher Criteria: 2 (40% probability- recommend ortho consult) [AG]  1041 MR findings: osteomyelitis at L4-5 with associated paraspinal inflammatory changes and right psoas abscess   [AG]  1054 Ortho will follow PRN, Nephrology to follow [AG]    Clinical Course User Index [AG] Nada Chroman, DO   Medical Decision Making Amount and/or Complexity of Data Reviewed Labs: ordered. Radiology: ordered.  Risk Prescription drug management. Decision regarding hospitalization.   On initial evaluation patient is hemodynamically stable, afebrile however appears uncomfortable secondary to right sacral pain.  MR imaging performed with evidence of right psoas abscess and L4/L5 osteomyelitis.  Subutex surgery consulted with concern for septic arthritis due to elevated inflammatory markers with right hip pain however there is no evidence of effusion on x-ray and MR imaging therefore do not believe joint tap is necessary at this time.  Patient was started on broad-spectrum antibiotics with MRSA coverage secondary to known history of MRSA bacteremia.  Patient will be admitted to hospitalist service for dialysis, IV antibiotics as well as further imaging studies.  Patient agrees and understood plan of care at time of admission  Lavanda Bolster DO  Emergency Medicine PGY2       Larkyn Greenberger, DO 12/15/23 1534    Simon Lavonia SAILOR, MD 12/16/23 [phone]

## 2023-12-15 NOTE — H&P (Addendum)
 History and Physical    Patient: Alvin Daniels FMW:[id number] DOB: Sep 01, 1970 DOA: 12/14/2023 DOS: the patient was seen and examined on 12/15/2023 PCP: Pcp, No  Patient coming from: Home  Chief Complaint:  Chief Complaint  Patient presents with   Hip Pain   HPI: Alvin Daniels is a 53 y.o. male with medical history significant of ESRD on HD MWF, type 2 diabetes mellitus poorly controlled, HIV, chronic hepatitis C, PAD s/p bilateral BKA, and recent admission for MRSA septicemia, possible IE, and L middle finger OM s/p amputation per Ortho surgery on 5/29 (cultures grew E. 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  Pt was in [facility] until this past week when he had difficulty walking due to the pain in his R hip. The pain would bother him all day, but he was able to walk most of the day and rest in the evening. Yesterday, the pain was so bad that he was unable to walk at all, so he presented to the ED for further evaluation. Of note, pt was recently admitted for MRSA septicemia and possible IE and was continued on IV vancomycin  w/ HD until 7/19, and d/c recommended repeat TEE to eval for clearance of IE.  In the ED, pt AFVSS. Labs showed K 3.4, Cr 9.06, CRP 15.2, and ESR 133. MRI sacrum showed discitis and osteomyelitis at L4-5 with associated paraspinal inflammatory changes and right psoas Abscess. EDP ordered IV vancomycin /cefepime , and requested medicine admission.   Review of Systems: As mentioned in the history of present illness. All other systems reviewed and are negative. Past Medical History:  Diagnosis Date   Acute bilateral low back pain 01/03/2021   AIDS ([name]) 11/22/2014   Back pain 06/09/2019   BKA stump complication ([name]) 06/09/2019   Chronic diarrhea    Chronic hepatitis C without hepatic coma ([name]) 11/22/2014   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[name])    Right   Corneal ulcer  12/18/2022   Descemetocele of left eye 12/18/2022   Diabetic neuropathy ([name])    ESRD (end stage renal disease) on dialysis ([name])    TTS; don't remember street name (05/03/2014)   Hepatitis C    HIV INFECTION 06/27/2010   Qualifier: Diagnosis of  By: Renae CMA ( AAMA), Jacqueline     Hypotension 06/02/2012   Metabolic bone disease 06/18/2012   MRSA infection    Normocytic anemia 06/17/2012   Pancreatitis    Pressure ulcer of BKA stump, stage 2 ([name]) 11/22/2014   Severe protein-calorie malnutrition ([name]) 06/17/2012   Uncontrolled diabetes mellitus with complications 06/27/2010   Annotation: uncontrolled Qualifier: Diagnosis of  By: Renae CMA ETTER FRUIT), Jacqueline     Vaccine counseling 05/29/2022   Past Surgical History:  Procedure Laterality Date   AMPUTATION Left 04/20/2014   Procedure: 3rd toe amputation, 4th Toe Amputation,  5th Toe Amputation;  Surgeon: Jerona Harden GAILS, MD;  Location: [facility] OR;  Service: Orthopedics;  Laterality: Left;   AMPUTATION Left 05/02/2014   Procedure: Midfoot Amputation;  Surgeon: Jerona Harden GAILS, MD;  Location: [facility] OR;  Service: Orthopedics;  Laterality: Left;   AMPUTATION Left 06/17/2014   Procedure: AMPUTATION BELOW KNEE;  Surgeon: Jerona Harden GAILS, MD;  Location: [facility] OR;  Service: Orthopedics;  Laterality: Left;   AMPUTATION Right 09/04/2018   Procedure: RIGHT BELOW KNEE AMPUTATION;  Surgeon: Harden Jerona GAILS, MD;  Location: [facility] OR;  Service: Orthopedics;  Laterality: Right;  RIGHT  BELOW KNEE AMPUTATION   AMPUTATION FINGER Left 10/09/2023   Procedure: AMPUTATION, FINGER;  Surgeon: Romona Harari, MD;  Location: [facility] OR;  Service: Orthopedics;  Laterality: Left;  Left Ring Finger amputation   AV FISTULA PLACEMENT Left    AV FISTULA PLACEMENT Left 05/10/2016   Procedure: Creation Left Arm Brachiocephalic Arteriovenous Fistula and Ligation of Radiocephalic Fistula;  Surgeon: Lonni GORMAN Blade, MD;  Location: [facility] OR;  Service: Vascular;  Laterality: Left;    DIALYSIS/PERMA CATHETER INSERTION N/A 09/01/2023   Procedure: DIALYSIS/PERMA CATHETER INSERTION;  Surgeon: Melia Lynwood ORN, MD;  Location: [facility] INVASIVE CV LAB;  Service: Cardiovascular;  Laterality: N/A;   FEMUR IM NAIL Left 08/17/2016   Procedure: INTRAMEDULLARY (IM) RETROGRADE FEMORAL NAILING;  Surgeon: Oneil JAYSON Herald, MD;  Location: [facility] OR;  Service: Orthopedics;  Laterality: Left;   FOOT AMPUTATION THROUGH ANKLE Left 12/'21/2015   midfoot   IR AV DIALY SHUNT INTRO NEEDLE/INTRACATH INITIAL W/PTA/IMG LEFT  04/17/2023   IR FLUORO GUIDE CV LINE RIGHT  10/14/2023   IR FLUORO GUIDE CV LINE RIGHT  11/11/2023   IR GENERIC HISTORICAL Left 05/01/2016   IR THROMBECTOMY AV FISTULA W/THROMBOLYSIS/PTA INC/SHUNT/IMG LEFT 05/01/2016 Toribio Faes, MD [facility]   IR GENERIC HISTORICAL  05/01/2016   IR US  GUIDE VASC ACCESS LEFT 05/01/2016 Toribio Faes, MD [facility]   IR GENERIC HISTORICAL  05/07/2016   IR FLUORO GUIDE CV LINE RIGHT 05/07/2016 Ami Bellman, DO [facility]   IR GENERIC HISTORICAL  05/07/2016   IR US  GUIDE VASC ACCESS RIGHT 05/07/2016 Ami Bellman, DO [facility]   IR GENERIC HISTORICAL  05/22/2016   IR US  GUIDE VASC ACCESS RIGHT 05/22/2016 Ozell Specking, MD [facility]   IR GENERIC HISTORICAL  05/22/2016   IR FLUORO GUIDE CV LINE RIGHT 05/22/2016 Ozell Specking, MD [facility]   IR REMOVAL TUN CV CATH W/O FL  08/21/2016   IR REMOVAL TUN CV CATH W/O FL  10/12/2023   IR US  GUIDE VASC ACCESS LEFT  04/17/2023   IR US  GUIDE VASC ACCESS RIGHT  10/14/2023   PERIPHERAL VASCULAR CATHETERIZATION Left 05/09/2016   Procedure: A/V Fistulagram;  Surgeon: Lonni GORMAN Blade, MD;  Location: [facility] INVASIVE CV LAB;  Service: Cardiovascular;  Laterality: Left;  arm   TEE WITHOUT CARDIOVERSION N/A 06/22/2018   Procedure: TRANSESOPHAGEAL ECHOCARDIOGRAM (TEE);  Surgeon: Jeffrie Oneil JAYSON, MD;  Location: [facility] ENDOSCOPY;  Service: Cardiovascular;  Laterality: N/A;   TRANSESOPHAGEAL ECHOCARDIOGRAM (CATH LAB) N/A 10/15/2023    Procedure: TRANSESOPHAGEAL ECHOCARDIOGRAM;  Surgeon: Rolan Ezra GORMAN, MD;  Location: [facility] INVASIVE CV LAB;  Service: Cardiovascular;  Laterality: N/A;   Social History:  reports that he has never smoked. He has never used smokeless tobacco. He reports that he does not drink alcohol  and does not use drugs.  No Known Allergies  Family History  Problem Relation Age of Onset   Diabetes Mother    Diabetes Father     Prior to Admission medications   Medication Sig Start Date End Date Taking? Authorizing Provider  insulin  aspart protamine  - aspart (NOVOLOG  70/30 FLEXPEN) (70-30) 100 UNIT/ML FlexPen Inject 20 Units into the skin 2 (two) times daily. Patient taking differently: Inject 10 Units into the skin 2 (two) times daily. 07/17/23  Yes Tanda Bleacher, MD  ZYPITAMAG  4 MG TABS Take by mouth. 11/26/23  Yes [provider]  bictegravir-emtricitabine -tenofovir  AF (BIKTARVY ) 50-200-25 MG TABS tablet Take 1 tablet by mouth daily. 04/16/23   Fleeta Kathie Jomarie LOISE, MD  gabapentin  (NEURONTIN ) 100  MG capsule Take 1 capsule (100 mg total) by mouth at bedtime. 10/17/23 01/15/24  [place], Eric J, DO  HYDROcodone -acetaminophen  (NORCO/VICODIN) 5-325 MG tablet Take 1 tablet by mouth every 6 (six) hours as needed for severe pain (pain score 7-10) or moderate pain (pain score 4-6). Patient not taking: Reported on 12/15/2023 10/17/23   [place], Camellia PARAS, DO  midodrine  (PROAMATINE ) 10 MG tablet Take 1 tablet (10 mg total) by mouth 3 (three) times daily as needed for low blood pressure, SBP</=100. may take 30 min prior to dialysis and mid treatment as needed. 09/05/23     bictegravir-emtricitabine -tenofovir  AF (BIKTARVY ) 50-200-25 MG TABS tablet Take 1 tablet by mouth daily. 01/02/22   Fleeta Kathie Jomarie LOISE, MD    Physical Exam: Vitals:   12/15/23 1030 12/15/23 1100 12/15/23 1130 12/15/23 1251  BP: 111/71 126/87 118/81 100/81  Pulse:  83 81 82  Resp: 12 12 10 16   Temp:    98 F (36.7 C)  TempSrc:    Oral  SpO2:  100%  100% 100%  Weight:      Height:       General: Alert, oriented x3, resting comfortably in no acute distress Respiratory: Lungs clear to auscultation bilaterally with normal respiratory effort; no w/r/r Cardiovascular: Regular rate and rhythm w/o m/r/g   Data Reviewed:  Lab Results  Component Value Date   WBC 10.1 12/14/2023   HGB 10.0 (L) 12/14/2023   HCT 30.9 (L) 12/14/2023   MCV 99.7 12/14/2023   PLT 249 12/14/2023   Lab Results  Component Value Date   GLUCOSE 60 (L) 12/15/2023   CALCIUM  9.2 12/15/2023   NA 134 (L) 12/15/2023   K 3.4 (L) 12/15/2023   CO2 23 12/15/2023   CL 93 (L) 12/15/2023   BUN 39 (H) 12/15/2023   CREATININE 9.06 (H) 12/15/2023   Lab Results  Component Value Date   ALT 9 12/15/2023   AST 19 12/15/2023   ALKPHOS 74 12/15/2023   BILITOT 1.8 (H) 12/15/2023   Lab Results  Component Value Date   INR 1.1 12/15/2023   INR 1.2 10/08/2023   INR 1.2 02/28/2020    Radiology: MR SACRUM SI JOINTS W WO CONTRAST Result Date: 12/15/2023 CLINICAL DATA:  Septic arthritis suspected, hip, xray done Right hip pain. EXAM: MRI PELVIS WITHOUT AND WITH CONTRAST TECHNIQUE: Multiplanar multisequence MR imaging of the pelvis with attention to the sacroiliac joints was performed both before and after administration of intravenous contrast. CONTRAST:  5mL GADAVIST  GADOBUTROL  1 MMOL/ML IV SOLN COMPARISON:  Right hip radiographs 12/14/2023. MRI of the sacrum 10/15/2023 and MRI of the right hip 10/13/2023. FINDINGS: Bones/Joint/Cartilage No evidence of sacroiliitis, osteomyelitis or acute fracture within the visualized bony pelvis. However, there is new T2 hyperintensity and heterogeneous enhancement associated with the L4-5 disc and inferior endplate of L4, concerning for discitis and osteomyelitis. Paraspinal inflammatory changes are further described below. Mild degenerative changes at the hips and sacroiliac joints without significant joint effusions. Artifact from left femoral  intramedullary nail noted. Ligaments No significant ligamentous abnormalities are identified. Muscles and Tendons Heterogeneous enhancement within the psoas muscles bilaterally with a peripherally enhancing fluid collection on the right measuring up to 2.2 x 1.2 x 4.1 cm, consistent with an abscess. No significant pelvic muscular abnormalities are identified. Soft tissue As above, findings suspicious for discitis and osteomyelitis at L4-5. There is heterogeneous paraspinal enhancement with extension into the psoas musculature as described above. No abscess or other focal fluid collection identified within the  pelvis. IMPRESSION: 1. Findings are consistent with discitis and osteomyelitis at L4-5 with associated paraspinal inflammatory changes and right psoas abscess. Recommend dedicated lumbar MRI without and with contrast. 2. No evidence of sacroiliitis, osteomyelitis or septic arthritis within the pelvis. 3. Mild degenerative changes at the hips and sacroiliac joints. Electronically Signed   By: Elsie Perone M.D.   On: 12/15/2023 10:36   DG Hip Unilat W or Wo Pelvis 2-3 Views Right Result Date: 12/14/2023 CLINICAL DATA:  Right hip pain. EXAM: DG HIP (WITH OR WITHOUT PELVIS) 2-3V RIGHT COMPARISON:  None Available. FINDINGS: There is no evidence of acute hip fracture or dislocation. A radiopaque intramedullary rod and associated fixation screw are seen within the proximal left femoral shaft. There is no evidence of arthropathy or other focal bone abnormality. Marked severity vascular calcification is noted. IMPRESSION: 1. No acute findings. 2. Prior ORIF of the left femur. Electronically Signed   By: Suzen Dials M.D.   On: 12/14/2023 23:29    Assessment and Plan: 37M h/o ESRD on HD MWF, type 2 diabetes mellitus poorly controlled, HIV, chronic hepatitis C, PAD s/p bilateral BKA, and recent admission for MRSA septicemia, possible IE, and L middle finger OM s/p amputation per Ortho surgery on 5/29  (cultures grew E. Coli and Streptococcus anginosus  who p/w R hip pain and found to have R psoas abscess c/b L4-5 OM on MRI.  R psoas abscess L4-5 osteomyelitis -Ortho consulted; apprec eval/recs -Continue IV vancomycin  and IV cefepime  for now; consult ID once blood cultures resulted -Multimodal pain control w/ Tylenol  1g TID, robaxin  500mg  TID, gabapentin  100mg  TID, and oxycodone  5mg  q4h prn -F/u blood cultures   H/o septicemia Valvular vegetation on TEE -F/u TEE while admitted to monitor for IE clearance  ESRD -Renal consulted; apprec eval/recs  HIV -PTA Biktarvy  50-200-25mg  1 tablet daily   Advance Care Planning:   Code Status: Full Code   Consults: ID, renal, and orthopedic surgery  Family Communication: N/A  Severity of Illness: The appropriate patient status for this patient is INPATIENT. Inpatient status is judged to be reasonable and necessary in order to provide the required intensity of service to ensure the patient's safety. The patient's presenting symptoms, physical exam findings, and initial radiographic and laboratory data in the context of their chronic comorbidities is felt to place them at high risk for further clinical deterioration. Furthermore, it is not anticipated that the patient will be medically stable for discharge from the hospital within 2 midnights of admission.   * I certify that at the point of admission it is my clinical judgment that the patient will require inpatient hospital care spanning beyond 2 midnights from the point of admission due to high intensity of service, high risk for further deterioration and high frequency of surveillance required.*   ------- I spent 60 minutes reviewing previous labs/notes, obtaining separate history at the bedside, counseling/discussing the treatment plan outlined above, ordering medications/tests, and performing clinical documentation.  Author: Marsha Ada, MD 12/15/2023 1:08 PM  For on call review  [email or web address].

## 2023-12-15 NOTE — Progress Notes (Signed)
 IVT consult placed for [facility] assessment. Patient arrived from ED with [facility] in place with no dressing. Site open to air. Sutures in place. Site was vigorously cleaned and sterile dressing applied. Skin has irritation noted from previous dressings - patient reports that dressing was removed yesterday due to irritation. Instructed patient to inform RN if current dressing causes irritation - IVT can come re-assess and use alternative dressing. Dialysis on MWF.   Ira Busbin R Khy Pitre, RN

## 2023-12-15 NOTE — ED Notes (Addendum)
 This nurse along with NT attempted to ambulate patient. Prosthetic legs were placed back patient. Patient was sat on the edge of the bed and was stated that his hip still hurt too much to walk. This nurse tried to encourage patient to walk but patient did not feel like he could.

## 2023-12-15 NOTE — ED Notes (Signed)
 MRI called advising patient could not sit still due to pain, once meds ordered I went and gave in MRI.

## 2023-12-15 NOTE — ED Notes (Signed)
 Charge Notified patient coming up

## 2023-12-15 NOTE — Progress Notes (Signed)
 ED Pharmacy Antibiotic Sign Off An antibiotic consult was received from an ED provider for vanocmycin and cefepime  per pharmacy dosing for psoas abscess, osteomyelitis. A chart review was completed to assess appropriateness.   The following one time order(s) were placed:  Cefepime  2G IV x1  Vancomycin  1g IV x1  Further antibiotic and/or antibiotic pharmacy consults should be ordered by the admitting provider if indicated.   Thank you for allowing pharmacy to be a part of this patient's care.   Koren LITTIE Or, [facility]  Clinical Pharmacist 12/15/23 10:50 AM

## 2023-12-15 NOTE — ED Notes (Signed)
 Patient transported to MRI

## 2023-12-15 NOTE — Consult Note (Addendum)
 Renal Service Consult Note [facility]  Alvin Daniels 12/15/2023 Alvin JONETTA Fret, MD Requesting Physician: Dr. Georgina  Reason for Consult: ESRD pt w/ R hip pain HPI: The patient is a 53 y.o. year-old w/ PMH as below who presented to ED c/o R hip pain after a fall at home. Takes HD MWF, has not missed. Has bilat BKA's. In ED BP 106/79, HR 82, RR 14, temp 97.9. 100% on RA. Na 134, K 3.4, bun 39, creat 9.06, alb 2.7. LA 0.9, 1.0. WBC 10K, Hb 10.0. ESR 110, CRP 15.2. MRI of sacrum /joints showed L discitis and osteomyelitis at L4-5 w/ assoc right psoas abscess. Blood cx's sent and IV abx started w/ vanc/ cefepime . Pt is to be admitted. We are asked to see for dialysis.    Pt seen in room. Main c/o is R hip pain. No SOB, LE edema.   Pt admitted in June for necrotic finger w/ assoc MRSA bacteremia. Required TDC line holiday and L ring finger amputation.    ROS - denies CP, no joint pain, no HA, no blurry vision, no rash, no diarrhea, no nausea/ vomiting   Past Medical History  Past Medical History:  Diagnosis Date   Acute bilateral low back pain 01/03/2021   AIDS ([name]) 11/22/2014   Back pain 06/09/2019   BKA stump complication ([name]) 06/09/2019   Chronic diarrhea    Chronic hepatitis C without hepatic coma ([name]) 11/22/2014   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[name])    Right   Corneal ulcer 12/18/2022   Descemetocele of left eye 12/18/2022   Diabetic neuropathy ([name])    ESRD (end stage renal disease) on dialysis ([name])    TTS; don't remember street name (05/03/2014)   Hepatitis C    HIV INFECTION 06/27/2010   Qualifier: Diagnosis of  By: Renae CMA ( AAMA), Jacqueline     Hypotension 06/02/2012   Metabolic bone disease 06/18/2012   MRSA infection    Normocytic anemia 06/17/2012   Pancreatitis    Pressure ulcer of BKA stump, stage 2 ([name]) 11/22/2014   Severe protein-calorie malnutrition ([name]) 06/17/2012   Uncontrolled diabetes mellitus with  complications 06/27/2010   Annotation: uncontrolled Qualifier: Diagnosis of  By: Renae CMA ETTER FRUIT), Jacqueline     Vaccine counseling 05/29/2022   Past Surgical History  Past Surgical History:  Procedure Laterality Date   AMPUTATION Left 04/20/2014   Procedure: 3rd toe amputation, 4th Toe Amputation,  5th Toe Amputation;  Surgeon: Jerona Harden GAILS, MD;  Location: [facility] OR;  Service: Orthopedics;  Laterality: Left;   AMPUTATION Left 05/02/2014   Procedure: Midfoot Amputation;  Surgeon: Jerona Harden GAILS, MD;  Location: [facility] OR;  Service: Orthopedics;  Laterality: Left;   AMPUTATION Left 06/17/2014   Procedure: AMPUTATION BELOW KNEE;  Surgeon: Jerona Harden GAILS, MD;  Location: [facility] OR;  Service: Orthopedics;  Laterality: Left;   AMPUTATION Right 09/04/2018   Procedure: RIGHT BELOW KNEE AMPUTATION;  Surgeon: Harden Jerona GAILS, MD;  Location: [facility] OR;  Service: Orthopedics;  Laterality: Right;  RIGHT BELOW KNEE AMPUTATION   AMPUTATION FINGER Left 10/09/2023   Procedure: AMPUTATION, FINGER;  Surgeon: Romona Harari, MD;  Location: [facility] OR;  Service: Orthopedics;  Laterality: Left;  Left Ring Finger amputation   AV FISTULA PLACEMENT Left    AV FISTULA PLACEMENT Left 05/10/2016   Procedure: Creation Left Arm Brachiocephalic Arteriovenous Fistula and Ligation of Radiocephalic Fistula;  Surgeon: Lonni GORMAN Blade, MD;  Location: [facility] OR;  Service: Vascular;  Laterality: Left;  DIALYSIS/PERMA CATHETER INSERTION N/A 09/01/2023   Procedure: DIALYSIS/PERMA CATHETER INSERTION;  Surgeon: Melia Lynwood ORN, MD;  Location: [facility] INVASIVE CV LAB;  Service: Cardiovascular;  Laterality: N/A;   FEMUR IM NAIL Left 08/17/2016   Procedure: INTRAMEDULLARY (IM) RETROGRADE FEMORAL NAILING;  Surgeon: Oneil JAYSON Herald, MD;  Location: [facility] OR;  Service: Orthopedics;  Laterality: Left;   FOOT AMPUTATION THROUGH ANKLE Left 12/'21/2015   midfoot   IR AV DIALY SHUNT INTRO NEEDLE/INTRACATH INITIAL W/PTA/IMG LEFT  04/17/2023   IR FLUORO GUIDE CV LINE RIGHT   10/14/2023   IR FLUORO GUIDE CV LINE RIGHT  11/11/2023   IR GENERIC HISTORICAL Left 05/01/2016   IR THROMBECTOMY AV FISTULA W/THROMBOLYSIS/PTA INC/SHUNT/IMG LEFT 05/01/2016 Toribio Faes, MD [facility]   IR GENERIC HISTORICAL  05/01/2016   IR US  GUIDE VASC ACCESS LEFT 05/01/2016 Toribio Faes, MD [facility]   IR GENERIC HISTORICAL  05/07/2016   IR FLUORO GUIDE CV LINE RIGHT 05/07/2016 Ami Bellman, DO [facility]   IR GENERIC HISTORICAL  05/07/2016   IR US  GUIDE VASC ACCESS RIGHT 05/07/2016 Ami Bellman, DO [facility]   IR GENERIC HISTORICAL  05/22/2016   IR US  GUIDE VASC ACCESS RIGHT 05/22/2016 Ozell Specking, MD [facility]   IR GENERIC HISTORICAL  05/22/2016   IR FLUORO GUIDE CV LINE RIGHT 05/22/2016 Ozell Specking, MD [facility]   IR REMOVAL TUN CV CATH W/O FL  08/21/2016   IR REMOVAL TUN CV CATH W/O FL  10/12/2023   IR US  GUIDE VASC ACCESS LEFT  04/17/2023   IR US  GUIDE VASC ACCESS RIGHT  10/14/2023   PERIPHERAL VASCULAR CATHETERIZATION Left 05/09/2016   Procedure: A/V Fistulagram;  Surgeon: Lonni GORMAN Blade, MD;  Location: [facility] INVASIVE CV LAB;  Service: Cardiovascular;  Laterality: Left;  arm   TEE WITHOUT CARDIOVERSION N/A 06/22/2018   Procedure: TRANSESOPHAGEAL ECHOCARDIOGRAM (TEE);  Surgeon: Jeffrie Oneil JAYSON, MD;  Location: [facility] ENDOSCOPY;  Service: Cardiovascular;  Laterality: N/A;   TRANSESOPHAGEAL ECHOCARDIOGRAM (CATH LAB) N/A 10/15/2023   Procedure: TRANSESOPHAGEAL ECHOCARDIOGRAM;  Surgeon: Rolan Ezra GORMAN, MD;  Location: [facility] INVASIVE CV LAB;  Service: Cardiovascular;  Laterality: N/A;   Family History  Family History  Problem Relation Age of Onset   Diabetes Mother    Diabetes Father    Social History  reports that he has never smoked. He has never used smokeless tobacco. He reports that he does not drink alcohol  and does not use drugs. Allergies No Known Allergies Home medications Prior to Admission medications   Medication Sig Start Date End Date Taking? Authorizing  Provider  bictegravir-emtricitabine -tenofovir  AF (BIKTARVY ) 50-200-25 MG TABS tablet Take 1 tablet by mouth daily. 04/16/23   Fleeta Kathie Jomarie LOISE, MD  gabapentin  (NEURONTIN ) 100 MG capsule Take 1 capsule (100 mg total) by mouth at bedtime. 10/17/23 01/15/24  [place], Eric J, DO  HYDROcodone -acetaminophen  (NORCO/VICODIN) 5-325 MG tablet Take 1 tablet by mouth every 6 (six) hours as needed for severe pain (pain score 7-10) or moderate pain (pain score 4-6). 10/17/23   [place], Camellia PARAS, DO  insulin  aspart protamine  - aspart (NOVOLOG  70/30 FLEXPEN) (70-30) 100 UNIT/ML FlexPen Inject 20 Units into the skin 2 (two) times daily. Patient taking differently: Inject 15 Units into the skin 2 (two) times daily. 07/17/23   Tanda Bleacher, MD  midodrine  (PROAMATINE ) 10 MG tablet Take 1 tablet (10 mg total) by mouth 3 (three) times daily as needed for low blood pressure, SBP</=100. may take 30 min prior to dialysis and mid treatment as  needed. 09/05/23     bictegravir-emtricitabine -tenofovir  AF (BIKTARVY ) 50-200-25 MG TABS tablet Take 1 tablet by mouth daily. 01/02/22   Fleeta Kathie Jomarie LOISE, MD     Vitals:   12/15/23 1016 12/15/23 1030 12/15/23 1100 12/15/23 1130  BP:  111/71 126/87 118/81  Pulse:   83 81  Resp:  12 12 10   Temp: 97.8 F (36.6 C)     TempSrc: Oral     SpO2:   100% 100%  Weight:      Height:       Exam Gen alert, no distress, 100% on RA No rash, cyanosis or gangrene Sclera anicteric, throat clear  No jvd or bruits Chest clear bilat to bases, no rales/ wheezing RRR no MRG Abd soft ntnd no mass or ascites +bs GU deferred MS bilat LE amputee Ext no LE or UE edema, no other edema Neuro is alert, Ox 3 , nf    RIJ TDC intact  Home bp meds: Midodrine  10mg  tid prn for SBP < 100 Midodrine  10mg  pre HD mwf prn   OP HD: MWF NW 3.5h  B400  45.5kg  TDC  Heparin  2000   Assessment/ Plan: 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 Blood cx's sent, IV vanc/ cefepime  started.  Per pmd.  ESRD: on HD MWF. HD today if possible, otherwise in am BP: chronic low BP on midodrine  10 tid prn and 10mg  pre HD mwf as needed for SBP < 100.  Volume: not a big volume gainer, limits wt gains. 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     Myer Fret  MD [facility] 12/15/2023, 11:56 AM  Recent Labs  Lab 12/14/23 2332 12/15/23 0608  HGB 10.0*  --   ALBUMIN   --  2.7*  CALCIUM  9.0 9.2  CREATININE 8.38* 9.06*  K 3.7 3.4*   Inpatient medications:  heparin   5,000 Units Subcutaneous Q8H

## 2023-12-16 ENCOUNTER — Inpatient Hospital Stay (HOSPITAL_COMMUNITY): Payer: MEDICAID

## 2023-12-16 DIAGNOSIS — E1122 Type 2 diabetes mellitus with diabetic chronic kidney disease: Secondary | ICD-10-CM

## 2023-12-16 DIAGNOSIS — B182 Chronic viral hepatitis C: Secondary | ICD-10-CM

## 2023-12-16 DIAGNOSIS — M8608 Acute hematogenous osteomyelitis, other sites: Secondary | ICD-10-CM

## 2023-12-16 DIAGNOSIS — M4626 Osteomyelitis of vertebra, lumbar region: Secondary | ICD-10-CM | POA: Insufficient documentation

## 2023-12-16 DIAGNOSIS — N186 End stage renal disease: Secondary | ICD-10-CM

## 2023-12-16 LAB — HEPATITIS B SURFACE ANTIBODY, QUANTITATIVE: Hep B S AB Quant (Post): 226 m[IU]/mL

## 2023-12-16 LAB — GLUCOSE, CAPILLARY
Glucose-Capillary: 119 mg/dL — ABNORMAL HIGH (ref 70–99)
Glucose-Capillary: 140 mg/dL — ABNORMAL HIGH (ref 70–99)
Glucose-Capillary: 124 mg/dL — ABNORMAL HIGH (ref 70–99)
Glucose-Capillary: 189 mg/dL — ABNORMAL HIGH (ref 70–99)

## 2023-12-16 MED ORDER — HYDROMORPHONE HCL 1 MG/ML IJ SOLN
2.0000 mg | INTRAMUSCULAR | Status: DC | PRN
  Administered 2023-12-16 – 2023-12-18 (×7): 2 mg via INTRAVENOUS
  Filled 2023-12-16 (×8): qty 2

## 2023-12-16 MED ORDER — HYDROMORPHONE HCL 1 MG/ML IJ SOLN
1.0000 mg | INTRAMUSCULAR | Status: DC | PRN
  Administered 2023-12-16 (×2): 1 mg via INTRAVENOUS
  Filled 2023-12-16 (×2): qty 1

## 2023-12-16 MED ORDER — CHLORHEXIDINE GLUCONATE CLOTH 2 % EX PADS
6.0000 | MEDICATED_PAD | Freq: Every day | CUTANEOUS | Status: DC
  Administered 2023-12-17 – 2023-12-21 (×3): 6 via TOPICAL

## 2023-12-16 MED ORDER — INSULIN ASPART 100 UNIT/ML IJ SOLN
2.0000 [IU] | Freq: Three times a day (TID) | INTRAMUSCULAR | Status: DC
  Administered 2023-12-16 – 2023-12-22 (×7): 2 [IU] via SUBCUTANEOUS

## 2023-12-16 MED ORDER — IOHEXOL 350 MG/ML SOLN
75.0000 mL | Freq: Once | INTRAVENOUS | Status: AC | PRN
  Administered 2023-12-16: 75 mL via INTRAVENOUS

## 2023-12-16 MED ORDER — INSULIN ASPART 100 UNIT/ML IJ SOLN
0.0000 [IU] | Freq: Three times a day (TID) | INTRAMUSCULAR | Status: DC
  Administered 2023-12-21 – 2023-12-28 (×14): 1 [IU] via SUBCUTANEOUS
  Administered 2023-12-28: 2 [IU] via SUBCUTANEOUS
  Administered 2023-12-29: 1 [IU] via SUBCUTANEOUS
  Administered 2023-12-29: 2 [IU] via SUBCUTANEOUS
  Administered 2023-12-31: 1 [IU] via SUBCUTANEOUS
  Administered 2023-12-31: 2 [IU] via SUBCUTANEOUS
  Administered 2023-12-31: 1 [IU] via SUBCUTANEOUS
  Administered 2024-01-02: 2 [IU] via SUBCUTANEOUS
  Administered 2024-01-03 – 2024-01-30 (×10): 1 [IU] via SUBCUTANEOUS

## 2023-12-16 MED ORDER — INSULIN ASPART 100 UNIT/ML IJ SOLN
0.0000 [IU] | Freq: Every day | INTRAMUSCULAR | Status: DC
  Administered 2023-12-24 – 2023-12-28 (×4): 2 [IU] via SUBCUTANEOUS
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
## 2023-12-16 NOTE — Plan of Care (Signed)

## 2023-12-16 NOTE — Progress Notes (Signed)
 [facility] PROGRESS NOTE    Progress Note  Javin Wardrip  FMW:[id number] DOB: [birth year] DOA: 12/14/2023 PCP: Pcp, No     Brief Narrative:   Della Krolikowski is an 53 y.o. 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 MRI showed a right psoas abscess of his 2.2 x 1 x 4.1 cm, consult IR for possible drainage CRP elevated 15, ESR 133 Blood cultures were ordered, ID was consulted. Continue narcotics for analgesics. At home he is usually on midodrine  as blood pressure is borderline he is currently asymptomatic.  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) With a last A1c of 10, blood glucose was 60 on admission. Start him on sliding scale insulin .  Will hold long-acting insulin  for now.  End-stage renal disease on hemodialysis: Renal has been consulted.  HIV: Continue current home regimen.  Severe protein-calorie malnutrition ([name]) Noted.  Normocytic anemia Anemia of chronic renal disease, hemoglobin appears to be stable.  S/P bilateral BKA (below knee amputation) ([name]) Noted.  Chronic hepatitis C without hepatic coma ([name]) Noted.   DVT prophylaxis: lovenox  Family Communication:none Status is: Inpatient Remains inpatient appropriate because: Discitis osteomyelitis and psoas abscess.    Code Status:     Code Status Orders  (From admission, onward)            Start     Ordered   12/15/23 1109  Full code  Continuous       Question:  By:  Answer:  Consent: discussion documented in EHR   12/15/23 1109           Code Status History     Date Active Date Inactive Code Status Order ID Comments User Context   10/08/2023 2333 10/18/2023 1617 Full Code [id number]  Lou Claretta HERO, MD Inpatient   08/30/2023 0327 08/30/2023 1900 Full Code [id number]  Charlton Evalene RAMAN, MD ED   08/01/2022 2305 08/03/2022 1837 Full Code [id number]  Shona Terry SAILOR, DO ED   12/08/2020 2349 12/09/2020 2249 Full Code [id number]  Charlton Evalene RAMAN, MD ED   02/28/2020 2257 03/02/2020 1721 Full Code [id number]  Layman Raisin, DO ED   12/04/2019 2035 12/11/2019 0044 Full Code [id number]  Lonzell Emeline HERO, DO ED   12/02/2019 1710 12/03/2019 2203 Full Code [id number]  Kriste Emeline BRAVO, MD ED   09/28/2019 0146 10/02/2019 1805 Full Code [id number]  Franky Redia SAILOR, MD ED   08/30/2018 0007 09/07/2018 2024 Full Code [id number]  Lonzell Emeline HERO, DO ED   06/17/2018 0819 06/23/2018 2026 Full Code [id number]  Trixie Nilda HERO, MD ED   08/16/2016 0135 08/22/2016 1907 Full Code [id number]  Hilma Rankins, MD ED   05/24/2016 0512 05/25/2016 1630 Full Code [id number]  Hilma Rankins, MD ED   05/08/2016 1339 05/10/2016 2137 Full Code [id number]  Alec Marvia Blackwater, MD Inpatient   04/02/2016 0304 04/04/2016 1800 Full Code [id number]  Jonel Lonni SQUIBB, MD Inpatient   03/03/2016 1734 03/07/2016 1828 Full Code [id number]  Geronimo Amel, MD ED   06/17/2014 1819 06/20/2014 1602 Full  Code [id number]  Harden Jerona GAILS, MD Inpatient   05/02/2014 2023 05/04/2014 0049 Full Code [id number]  Harden Jerona GAILS, MD Inpatient   04/18/2014 1626 04/24/2014 1643 Full Code [id number]  Maribeth Camellia MATSU, MD Inpatient   04/15/2013 1319 04/22/2013 1920 Full Code [id number]  Judeth Trenda BIRCH, MD Inpatient   06/26/2012 0306 06/27/2012 2025 Full Code [id number]  Kara Reaper, MD Inpatient   06/02/2012 2347 06/03/2012 1804 Full Code [id number]  Delores Charlies HERO, RN Inpatient         IV Access:   Peripheral IV   Procedures and diagnostic studies:   MR SACRUM SI JOINTS W WO CONTRAST Result Date: 12/15/2023 CLINICAL DATA:  Septic arthritis suspected, hip, xray done Right hip pain. EXAM: MRI PELVIS WITHOUT AND WITH CONTRAST TECHNIQUE: Multiplanar multisequence MR imaging of the pelvis with attention to the sacroiliac joints was performed both before and after administration of intravenous contrast. CONTRAST:  5mL GADAVIST  GADOBUTROL  1 MMOL/ML IV SOLN COMPARISON:  Right hip radiographs 12/14/2023. MRI of the sacrum 10/15/2023 and MRI of the right hip 10/13/2023. FINDINGS: Bones/Joint/Cartilage No evidence of sacroiliitis, osteomyelitis or acute fracture within the visualized bony pelvis. However, there is new T2 hyperintensity and heterogeneous enhancement associated with the L4-5 disc and inferior endplate of L4, concerning for discitis and osteomyelitis. Paraspinal inflammatory changes are further described below. Mild degenerative changes at the hips and sacroiliac joints without significant joint effusions. Artifact from left femoral intramedullary nail noted. Ligaments No significant ligamentous abnormalities are identified. Muscles and Tendons Heterogeneous enhancement within the psoas muscles bilaterally with a peripherally enhancing fluid collection on the right measuring up to 2.2 x 1.2 x 4.1 cm, consistent with an abscess. No significant pelvic muscular abnormalities are identified. Soft tissue As above, findings suspicious for discitis and osteomyelitis at L4-5. There is heterogeneous paraspinal enhancement with extension into the psoas musculature as described above. No abscess or other focal fluid collection identified within the pelvis. IMPRESSION: 1. Findings are consistent with discitis and osteomyelitis at L4-5 with associated paraspinal inflammatory changes and right psoas abscess. Recommend dedicated lumbar MRI without and with contrast. 2. No  evidence of sacroiliitis, osteomyelitis or septic arthritis within the pelvis. 3. Mild degenerative changes at the hips and sacroiliac joints. Electronically Signed   By: Elsie Perone M.D.   On: 12/15/2023 10:36   DG Hip Unilat W or Wo Pelvis 2-3 Views Right Result Date: 12/14/2023 CLINICAL DATA:  Right hip pain. EXAM: DG HIP (WITH OR WITHOUT PELVIS) 2-3V RIGHT COMPARISON:  None Available. FINDINGS: There is no evidence of acute hip fracture or dislocation. A radiopaque intramedullary rod and associated fixation screw are seen within the proximal left femoral shaft. There is no evidence of arthropathy or other focal bone abnormality. Marked severity vascular calcification is noted. IMPRESSION: 1. No acute findings. 2. Prior ORIF of the left femur. Electronically Signed   By: Suzen Dials M.D.   On: 12/14/2023 23:29     Medical Consultants:   None.   Subjective:    Kenta Alvarez relates he continues to have back pain.  Objective:    Vitals:   12/16/23 0052 12/16/23 0340 12/16/23 0500 12/16/23 0639  BP: (!) 92/56 (!) 82/43 (!) 82/52 (!) 72/60  Pulse: (!) 105 89 82 85  Resp: 16 16    Temp: (!) 97.5 F (36.4 C)     TempSrc: Oral     SpO2: 97% 100%    Weight:      Height:  SpO2: 100 %   Intake/Output Summary (Last 24 hours) at 12/16/2023 0659 Last data filed at 12/16/2023 0225 Gross per 24 hour  Intake 0 ml  Output --  Net 0 ml   Filed Weights   12/14/23 2215  Weight: 47.6 kg    Exam: General exam: In no acute distress. Respiratory system: Good air movement and clear to auscultation. Cardiovascular system: S1 & S2 heard, RRR. No JVD. Gastrointestinal system: Abdomen is nondistended, soft and nontender.  Extremities: No pedal edema. Skin: No rashes, lesions or ulcers Psychiatry: Judgement and insight appear normal. Mood & affect appropriate.    Data Reviewed:    Labs: Basic Metabolic Panel: Recent Labs  Lab 12/14/23 2332  12/15/23 0608  NA 131* 134*  K 3.7 3.4*  CL 94* 93*  CO2 23 23  GLUCOSE 85 60*  BUN 37* 39*  CREATININE 8.38* 9.06*  CALCIUM  9.0 9.2   GFR Estimated Creatinine Clearance: 6.3 mL/min (A) (by C-G formula based on SCr of 9.06 mg/dL (H)). Liver Function Tests: Recent Labs  Lab 12/15/23 0608  AST 19  ALT 9  ALKPHOS 74  BILITOT 1.8*  PROT 10.3*  ALBUMIN  2.7*   No results for input(s): LIPASE, AMYLASE in the last 168 hours. No results for input(s): AMMONIA in the last 168 hours. Coagulation profile Recent Labs  Lab 12/15/23 0608  INR 1.1   COVID-19 Labs  Recent Labs    12/15/23 0451  CRP 15.2*    Lab Results  Component Value Date   SARSCOV2NAA NEGATIVE 12/08/2020   SARSCOV2NAA POSITIVE (A) 05/25/2020   SARSCOV2NAA NEGATIVE 02/28/2020   SARSCOV2NAA NEGATIVE 02/27/2020    CBC: Recent Labs  Lab 12/14/23 2332  WBC 10.1  NEUTROABS 7.7  HGB 10.0*  HCT 30.9*  MCV 99.7  PLT 249   Cardiac Enzymes: No results for input(s): CKTOTAL, CKMB, CKMBINDEX, TROPONINI in the last 168 hours. BNP (last 3 results) No results for input(s): PROBNP in the last 8760 hours. CBG: Recent Labs  Lab 12/14/23 2331  GLUCAP 81   D-Dimer: No results for input(s): DDIMER in the last 72 hours. Hgb A1c: No results for input(s): HGBA1C in the last 72 hours. Lipid Profile: No results for input(s): CHOL, HDL, LDLCALC, TRIG, CHOLHDL, LDLDIRECT in the last 72 hours. Thyroid function studies: No results for input(s): TSH, T4TOTAL, T3FREE, THYROIDAB in the last 72 hours.  Invalid input(s): FREET3 Anemia work up: No results for input(s): VITAMINB12, FOLATE, FERRITIN, TIBC, IRON , RETICCTPCT in the last 72 hours. Sepsis Labs: Recent Labs  Lab 12/14/23 2332 12/15/23 0614 12/15/23 0741  WBC 10.1  --   --   LATICACIDVEN  --  0.9 1.0   Microbiology No results found for this or any previous visit (from the past 240  hours).   Medications:    acetaminophen   1,000 mg Oral TID   bictegravir-emtricitabine -tenofovir  AF  1 tablet Oral Daily   Chlorhexidine  Gluconate Cloth  6 each Topical Q0600   gabapentin   100 mg Oral TID   heparin   5,000 Units Subcutaneous Q8H   heparin  sodium (porcine)  4,000 Units Intracatheter Once   methocarbamol   500 mg Oral TID   sodium chloride  flush  10-40 mL Intracatheter Q12H   Continuous Infusions:  ceFEPime  (MAXIPIME ) IV     vancomycin  500 mg (12/16/23 0225)      LOS: 1 day   Erle Odell Castor  [facility]  12/16/2023, 6:59 AM

## 2023-12-16 NOTE — Progress Notes (Signed)
  [facility] Progress Note   Subjective:  Patient seen and examined in his room. He had HD overnight and they were able to yield 600 mL. BP remains low, but he is on midodrine  for BP support. He voices no complaints today. Next HD 12/17/23  Objective Vitals:   12/16/23 0639 12/16/23 0737 12/16/23 1029 12/16/23 1029  BP: (!) 72/60 (!) 69/38  99/74  Pulse: 85 82  86  Resp:  18    Temp:  98 F (36.7 C) 98.1 F (36.7 C)   TempSrc:   Oral   SpO2:      Weight:      Height:       Exam Gen alert, no distress, 100% on RA No rash, cyanosis or gangrene Sclera anicteric, throat clear  No jvd or bruits Chest clear bilat to bases, no rales/ wheezing RRR no MRG Abd soft ntnd no mass or ascites +bs GU deferred MS bilat LE amputee Ext no LE or UE edema, no other edema Neuro is alert, Ox 3 , nf    RIJ TDC intact  Additional Objective Labs: Basic Metabolic Panel: Recent Labs  Lab 12/14/23 2332 12/15/23 0608  NA 131* 134*  K 3.7 3.4*  CL 94* 93*  CO2 23 23  GLUCOSE 85 60*  BUN 37* 39*  CREATININE 8.38* 9.06*  CALCIUM  9.0 9.2   Liver Function Tests: Recent Labs  Lab 12/15/23 0608  AST 19  ALT 9  ALKPHOS 74  BILITOT 1.8*  PROT 10.3*  ALBUMIN  2.7*   No results for input(s): LIPASE, AMYLASE in the last 168 hours. CBC: Recent Labs  Lab 12/14/23 2332  WBC 10.1  NEUTROABS 7.7  HGB 10.0*  HCT 30.9*  MCV 99.7  PLT 249   CBG: Recent Labs  Lab 12/14/23 2331 12/16/23 0738 12/16/23 1133  GLUCAP 81 119* 140*     Medications:  ceFEPime  (MAXIPIME ) IV     vancomycin  500 mg (12/16/23 0225)    acetaminophen   1,000 mg Oral TID   bictegravir-emtricitabine -tenofovir  AF  1 tablet Oral Daily   Chlorhexidine  Gluconate Cloth  6 each Topical Q0600   gabapentin   100 mg Oral TID   heparin   5,000 Units Subcutaneous Q8H   heparin  sodium (porcine)  4,000 Units Intracatheter Once   insulin  aspart  0-5 Units Subcutaneous QHS   insulin  aspart  0-6 Units  Subcutaneous TID WC   insulin  aspart  2 Units Subcutaneous TID WC   methocarbamol   500 mg Oral TID   sodium chloride  flush  10-40 mL Intracatheter Q12H    Home bp meds: Midodrine  10mg  tid prn for SBP < 100 Midodrine  10mg  pre HD mwf prn    OP HD: MWF [facility] 3.5h  B400  45.5kg  TDC  Heparin  2000     Assessment/ Plan: 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 Blood cx's sent, IV vanc/ cefepime  started. Per pmd.  ESRD: on HD MWF. Had HD overnight and 600 mL Uf'd. Next HD 12/17/23 BP: chronic low BP on midodrine  10 tid prn and 10mg  pre HD mwf as needed for SBP < 100. Can give albumin  during HD for low BP.  Volume: not a big volume gainer, limits wt gains. 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   Belvie Och, NP 12/16/2023, 2:04 PM  [facility]

## 2023-12-16 NOTE — Progress Notes (Signed)
 Pt receives out-pt HD at [facility] [place] on MWF 6:30 am chair time. Will assist as needed.   Randine Mungo Dialysis Navigator [phone]

## 2023-12-16 NOTE — Progress Notes (Signed)
   12/16/23 0000  Vitals  Temp 97.7 F (36.5 C)  Temp Source Oral  BP (!) 84/56  MAP (mmHg) 67  BP Location Right Arm  BP Method Automatic  Patient Position (if appropriate) Lying  Pulse Rate 88  During Treatment Monitoring  Blood Flow Rate (mL/min) 0 mL/min  Arterial Pressure (mmHg) 2.42 mmHg  Venous Pressure (mmHg) -2.63 mmHg  TMP (mmHg) 17.57 mmHg  Ultrafiltration Rate (mL/min) 0 mL/min  Dialysate Flow Rate (mL/min) 0 ml/min  Dialysate Potassium Concentration 3  Dialysate Calcium  Concentration 2.5  Duration of HD Treatment -hour(s) 3.43 hour(s)  Cumulative Fluid Removed (mL) per Treatment  633.73  HD Safety Checks Performed Yes  Intra-Hemodialysis Comments Tx completed  Post Treatment  Dialyzer Clearance Lightly streaked  Liters Processed 72  Fluid Removed (mL) 600 mL  Tolerated HD Treatment Yes  Post-Hemodialysis Comments  (lOW BLOOD PRESSURE WITH EPISODES OF DIARRHEA)  Hemodialysis Catheter Right Internal jugular Double lumen Permanent (Tunneled)  Placement Date/Time: 11/11/23 1428   Serial / Lot #: [id number]  Expiration Date: 04/14/28  Time Out: Correct patient;Correct site;Correct procedure  Maximum sterile barrier precautions: Hand hygiene;Cap;Mask;Sterile gown;Sterile gloves;Large sterile ...  Site Condition No complications  Blue Lumen Status Flushed;Heparin  locked  Red Lumen Status Flushed;Heparin  locked  Catheter fill solution Heparin  1000 units/ml  Catheter fill volume (Arterial) 1.6 cc  Catheter fill volume (Venous) 1.6  Dressing Type Transparent  Dressing Status Clean, Dry, Intact  Dressing Change Due 12/19/23  Post treatment catheter status Capped and Clamped   Uf GOAL DECREASE DUE TO LOW BLOOD PRESSURE.

## 2023-12-16 NOTE — Consult Note (Signed)
 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 coli and Streptococcus anginosus isolated End-stage renal disease on hemodialysis Diabetes mellitus HIV disease which is well-controlled on Biktarvy    Plan:  Continue vancomycin  with cefepime  and I would plan on a 6 to 8-week course that can be given with dialysis. Follow-up blood cultures Check sed rate CRP Continue Biktarvy  Contact precautions for MRSA  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)   Severe protein-calorie malnutrition ([name])   Normocytic anemia   ESRD on hemodialysis ([name])   S/P bilateral BKA (below knee amputation) ([name])   Chronic hepatitis C without hepatic coma ([name])   HIV (human immunodeficiency virus infection) ([name])   Hx MRSA infection   Osteomyelitis of lumbar spine ([name])   Scheduled Meds:  acetaminophen   1,000 mg Oral TID   bictegravir-emtricitabine -tenofovir  AF  1 tablet Oral Daily   [START ON 12/17/2023] Chlorhexidine  Gluconate Cloth  6 each Topical Q0600   gabapentin   100 mg Oral TID   heparin   5,000 Units Subcutaneous Q8H   heparin  sodium (porcine)  4,000 Units Intracatheter Once   insulin  aspart  0-5 Units Subcutaneous QHS   insulin  aspart  0-6 Units Subcutaneous TID WC   insulin  aspart  2 Units Subcutaneous TID WC   methocarbamol   500 mg Oral TID   sodium chloride  flush  10-40 mL Intracatheter Q12H   Continuous Infusions:  ceFEPime  (MAXIPIME ) IV     vancomycin  500 mg (12/16/23 0225)   PRN Meds:.HYDROmorphone  (DILAUDID ) injection, midodrine ,  oxyCODONE , sodium chloride  flush  HPI: Alvin Daniels is a 53 y.o. male patient who I follow in clinic at [facility] for his HIV which is always superbly controlled but who also has comorbid diabetes mellitus peripheral vascular disease end-stage renal disease on hemodialysis and has had multiple bloodstream infections and required bilateral below the knee amputations.  He was recently treated for MRSA bacteremia and endocarditis.  During that hospitalization he also was found of osteomyelitis of his finger and underwent amputation cultures on the finger had grown E. coli as well as a Streptococcus anginosus.  He presented to the hospital due to the fact that he was not able to ambulate properly on his right side with his prosthetic leg with also severe pain shooting down his leg.  Imaging including an MRI of the sacrum and SI joints shows discitis and osteomyelitis in L4-L5 with paraspinal inflammatory changes in the right psoas abscess  CT of the abdomen pelvis also showed same discitis and osteomyelitis in L4 and L5 with surrounding paraspinal inflammatory changes and right psoas abscess with both reads radiology of suggested better evaluation via dedicated lumbar MRI without and with contrast  Radiology been consulted for potential aspiration of the disc space and/or psoas abscess but do not find a safe window to approach this.  The patient has been started on antibiotics in the form of vancomycin  and cefepime .  I will proceed to go ahead and order a dedicated MRI lumbar spine without and with contrast.  Given that we are not going to have cultures  to rely on we are going to have to use empiric therapy and certainly I think vancomycin  and cefepime  is a good regimen that would cover most of the usual suspects and could be given with dialysis to allow to take a 6 to 8-week course of treatment repeat imaging in the interim.  Will continue on BIKTARVY  and as mentioned has always been  highly adherent to his antiretroviral medications.  Will make sure that a sed rate and CRP has been sent  Is on contact precautions due to his history of MRSA.  I have personally spent 80 minutes involved in face-to-face and non-face-to-face activities for this patient on the day of the visit. Professional time spent includes the following activities: Preparing to see the patient (review of tests), Obtaining and/or reviewing separately obtained history (admission/discharge record), Performing a medically appropriate examination and/or evaluation , Ordering medications/tests/procedures, referring and communicating with other health care professionals, Documenting clinical information in the EMR, Independently interpreting results (not separately reported), Communicating results to the patient/family/caregiver, Counseling and educating the patient/family/caregiver and Care coordination (not separately reported).   Evaluation of the patient requires complex antimicrobial therapy evaluation, counseling , isolation needs to reduce disease transmission and risk assessment and mitigation.     Review of Systems: Review of Systems  Constitutional:  Negative for chills, fever, malaise/fatigue and weight loss.  HENT:  Negative for congestion and sore throat.   Eyes:  Negative for blurred vision and photophobia.  Respiratory:  Negative for cough, shortness of breath and wheezing.   Cardiovascular:  Negative for chest pain, palpitations and leg swelling.  Gastrointestinal:  Negative for abdominal pain, blood in stool, constipation, diarrhea, heartburn, melena, nausea and vomiting.  Genitourinary:  Negative for dysuria, flank pain and hematuria.  Musculoskeletal:  Positive for joint pain and myalgias. Negative for back pain and falls.  Skin:  Negative for itching and rash.  Neurological:  Negative for dizziness, focal weakness, loss of consciousness, weakness and headaches.  Endo/Heme/Allergies:  Does not  bruise/bleed easily.  Psychiatric/Behavioral:  Negative for depression and suicidal ideas. The patient does not have insomnia.     Past Medical History:  Diagnosis Date   Acute bilateral low back pain 01/03/2021   AIDS ([name]) 11/22/2014   Back pain 06/09/2019   BKA stump complication ([name]) 06/09/2019   Chronic diarrhea    Chronic hepatitis C without hepatic coma ([name]) 11/22/2014   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[name])    Right   Corneal ulcer 12/18/2022   Descemetocele of left eye 12/18/2022   Diabetic neuropathy ([name])    ESRD (end stage renal disease) on dialysis ([name])    TTS; don't remember street name (05/03/2014)   Hepatitis C    HIV INFECTION 06/27/2010   Qualifier: Diagnosis of  By: Renae CMA ( AAMA), Jacqueline     Hypotension 06/02/2012   Metabolic bone disease 06/18/2012   MRSA infection    Normocytic anemia 06/17/2012   Pancreatitis    Pressure ulcer of BKA stump, stage 2 ([name]) 11/22/2014   Severe protein-calorie malnutrition ([name]) 06/17/2012   Uncontrolled diabetes mellitus with complications 06/27/2010   Annotation: uncontrolled Qualifier: Diagnosis of  By: Renae CMA ( AAMA), Jacqueline     Vaccine counseling 05/29/2022    Social History   Tobacco Use   Smoking status: Never   Smokeless tobacco: Never  Vaping Use   Vaping status: Never Used  Substance Use Topics   Alcohol  use: No   Drug use: No    Family History  Problem Relation Age of Onset   Diabetes Mother    Diabetes Father    No Known Allergies  OBJECTIVE: Blood pressure (!) 81/54, pulse 80, temperature 98 F (36.7 C), resp. rate 20, height 4' 11 (1.499 m), weight 47.6 kg, SpO2 100%.  Physical Exam Constitutional:      Appearance: He is well-developed.  HENT:     Head: Normocephalic and atraumatic.  Eyes:     Conjunctiva/sclera: Conjunctivae normal.  Cardiovascular:     Rate and Rhythm: Normal rate and regular rhythm.  Pulmonary:     Effort: Pulmonary effort is normal. No  respiratory distress.     Breath sounds: No wheezing.  Abdominal:     General: There is no distension.     Palpations: Abdomen is soft.  Musculoskeletal:     Cervical back: Normal range of motion and neck supple.  Skin:    General: Skin is warm and dry.     Coloration: Skin is not pale.     Findings: No erythema or rash.  Neurological:     General: No focal deficit present.     Mental Status: He is alert and oriented to person, place, and time.  Psychiatric:        Mood and Affect: Mood normal.        Behavior: Behavior normal.        Thought Content: Thought content normal.        Judgment: Judgment normal.     BKA sites    Lab Results Lab Results  Component Value Date   WBC 10.1 12/14/2023   HGB 10.0 (L) 12/14/2023   HCT 30.9 (L) 12/14/2023   MCV 99.7 12/14/2023   PLT 249 12/14/2023    Lab Results  Component Value Date   CREATININE 9.06 (H) 12/15/2023   BUN 39 (H) 12/15/2023   NA 134 (L) 12/15/2023   K 3.4 (L) 12/15/2023   CL 93 (L) 12/15/2023   CO2 23 12/15/2023    Lab Results  Component Value Date   ALT 9 12/15/2023   AST 19 12/15/2023   ALKPHOS 74 12/15/2023   BILITOT 1.8 (H) 12/15/2023     Microbiology: Recent Results (from the past 240 hours)  Blood Culture (routine x 2)     Status: None (Preliminary result)   Collection Time: 12/15/23  6:08 AM   Specimen: BLOOD RIGHT ARM  Result Value Ref Range Status   Specimen Description BLOOD RIGHT ARM  Final   Special Requests   Final    BOTTLES DRAWN AEROBIC AND ANAEROBIC Blood Culture adequate volume   Culture   Final    NO GROWTH 1 DAY Performed at [facility], [address] [place], [place] [address]    Report Status PENDING  Incomplete  Blood Culture (routine x 2)     Status: None (Preliminary result)   Collection Time: 12/15/23  6:13 AM   Specimen: BLOOD RIGHT ARM  Result Value Ref Range Status   Specimen Description BLOOD RIGHT ARM  Final   Special Requests   Final    BOTTLES DRAWN  AEROBIC AND ANAEROBIC Blood Culture adequate volume   Culture   Final    NO GROWTH 1 DAY Performed at [facility], [address] [place], [place] [address]    Report Status PENDING  Incomplete    [name], MD [facility] [phone] pager  12/16/2023, 4:30 PM

## 2023-12-17 ENCOUNTER — Inpatient Hospital Stay (HOSPITAL_COMMUNITY): Payer: MEDICAID

## 2023-12-17 DIAGNOSIS — Z89512 Acquired absence of left leg below knee: Secondary | ICD-10-CM

## 2023-12-17 DIAGNOSIS — E43 Unspecified severe protein-calorie malnutrition: Secondary | ICD-10-CM

## 2023-12-17 DIAGNOSIS — M4626 Osteomyelitis of vertebra, lumbar region: Secondary | ICD-10-CM

## 2023-12-17 DIAGNOSIS — Z992 Dependence on renal dialysis: Secondary | ICD-10-CM

## 2023-12-17 DIAGNOSIS — B20 Human immunodeficiency virus [HIV] disease: Secondary | ICD-10-CM

## 2023-12-17 DIAGNOSIS — Z89511 Acquired absence of right leg below knee: Secondary | ICD-10-CM

## 2023-12-17 LAB — CBC
HCT: 28.9 % — ABNORMAL LOW (ref 39.0–52.0)
Hemoglobin: 9 g/dL — ABNORMAL LOW (ref 13.0–17.0)
MCH: 31.5 pg (ref 26.0–34.0)
MCHC: 31.1 g/dL (ref 30.0–36.0)
MCV: 101 fL — ABNORMAL HIGH (ref 80.0–100.0)
Platelets: 249 K/uL (ref 150–400)
RBC: 2.86 MIL/uL — ABNORMAL LOW (ref 4.22–5.81)
RDW: 15.1 % (ref 11.5–15.5)
WBC: 9.1 K/uL (ref 4.0–10.5)
nRBC: 0 % (ref 0.0–0.2)

## 2023-12-17 LAB — GLUCOSE, CAPILLARY
Glucose-Capillary: 145 mg/dL — ABNORMAL HIGH (ref 70–99)
Glucose-Capillary: 113 mg/dL — ABNORMAL HIGH (ref 70–99)
Glucose-Capillary: 133 mg/dL — ABNORMAL HIGH (ref 70–99)
Glucose-Capillary: 149 mg/dL — ABNORMAL HIGH (ref 70–99)

## 2023-12-17 LAB — RENAL FUNCTION PANEL
Albumin: 2.3 g/dL — ABNORMAL LOW (ref 3.5–5.0)
Anion gap: 17 — ABNORMAL HIGH (ref 5–15)
BUN: 29 mg/dL — ABNORMAL HIGH (ref 6–20)
CO2: 22 mmol/L (ref 22–32)
Calcium: 8.8 mg/dL — ABNORMAL LOW (ref 8.9–10.3)
Chloride: 93 mmol/L — ABNORMAL LOW (ref 98–111)
Creatinine, Ser: 6.75 mg/dL — ABNORMAL HIGH (ref 0.61–1.24)
GFR, Estimated: 9 mL/min — ABNORMAL LOW (ref >60)
Glucose, Bld: 161 mg/dL — ABNORMAL HIGH (ref 70–99)
Phosphorus: 7.3 mg/dL — ABNORMAL HIGH (ref 2.5–4.6)
Potassium: 4.1 mmol/L (ref 3.5–5.1)
Sodium: 132 mmol/L — ABNORMAL LOW (ref 135–145)

## 2023-12-17 MED ORDER — HEPARIN SODIUM (PORCINE) 1000 UNIT/ML IJ SOLN
INTRAMUSCULAR | Status: AC
  Filled 2023-12-17: qty 4

## 2023-12-17 MED ORDER — HEPARIN SODIUM (PORCINE) 1000 UNIT/ML IJ SOLN
INTRAMUSCULAR | Status: AC
  Filled 2023-12-17: qty 2

## 2023-12-17 MED ORDER — OXYCODONE HCL 5 MG PO TABS
ORAL_TABLET | ORAL | Status: AC
  Filled 2023-12-17: qty 1

## 2023-12-17 MED ORDER — ALBUMIN HUMAN 25 % IV SOLN
INTRAVENOUS | Status: AC
  Filled 2023-12-17: qty 100

## 2023-12-17 MED ORDER — ALBUMIN HUMAN 25 % IV SOLN
25.0000 g | Freq: Once | INTRAVENOUS | Status: AC
  Administered 2023-12-17: 25 g via INTRAVENOUS

## 2023-12-17 NOTE — Plan of Care (Signed)

## 2023-12-17 NOTE — Hospital Course (Addendum)
 53 y.o. 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 coli and Streptococcus anginosus who presents with hip pain MRI was done that showed a right psoas abscess and L4-L5 discitis and osteomyelitis    Assessment and Plan:   L4-L5 osteomyelitis/discitis with right psoas abscess - Coverage empirically with cefepime  plus vancomycin .  MRI sacrum noting a 2.2 x 1 x 4.1 cm right psoas abscess.  Repeat MRI this morning noting discitis/osteomyelitis at L4-L5, right psoas abscess, and epidural phlegmon.  Also noting small paraspinal abscess anterior to L4-L5 as well as possible small left psoas abscess.  IR consulted for possible drainage however no safe access appreciated.  Not a surgical candidate.  Infectious disease consulted recommending conservative management with IV cefepime  plus vancomycin .  Patient to receive cefepime  plus vancomycin  with HD for total of 8 weeks, end date 02/09/2024.   ESRD on HD MWF - Nephrology following closely.  Next HD tomorrow 8/13.   Chronic hypotension - Likely exacerbated by infection and poor p.o. intake.  Midodrine  on board.  Nephrology following closely during HD.   Uncontrolled diabetes mellitus - Previous A1c 10 suggesting very poor control.  Currently on insulin  sliding scale.   HIV/chronic hep C - ID following.  Continue Biktarvy .   Severe protein calorie malnutrition - Protein supplementation encouraged.   Bilateral BKA with increased hip/leg pain - Contributing to physical debilitation and muscle weakness.  BL prosthetics in room.  Patient complaining of worsening pain with ambulation.  Physical therapy following closely.  Increased patient's pain regimen to include oxycodone  10 mg every  4 hours, lidocaine  patch, gabapentin .  Would like him to attempt to participate in ambulation as it is the limiting factor in in his discharge home.  Would need to ensure patient is able to be ambulatory prior to discharge.  May consider increasing oxycodone  or other medications.  Pain control will be obviously very difficult given infection and location.   Goals of care - Disposition planning with patient, TOC, infectious disease, nephrology.  Infectious disease consulted recommending conservative management with IV cefepime  plus vancomycin .  Patient received cefepime  plus vancomycin  with HD for total of 8 weeks, end date 02/09/2024.  Repeat MRI recommended by infectious disease as well, to be scheduled in the outpatient setting.  Working closely with TOC to ensure this is all set up prior to discharge home.  SNF not an option given patient is not a [place]  citizen.

## 2023-12-17 NOTE — Progress Notes (Signed)
  IR BRIEF PROGRESS NOTE:  IR was requested for right psoas fluid collection drainage. IR attending, Dr. Jennefer, reviewed the request, and asked for a CT A/P w/ CM to better delineate anatomy. CT was completed and reviewed again. Unfortunately, Dr. Jennefer does not identify a safe window to proceed with request of aspiration nor drainage. Order will be removed. Care team informed.   Electronically Signed: Carlin DELENA Griffon, PA-C 12/17/2023, 1:14 PM

## 2023-12-17 NOTE — Progress Notes (Signed)
 Progress Note   Patient: Alvin Daniels FMW:[id number] DOB: 04-19-1971 DOA: 12/14/2023  DOS: the patient was seen and examined on 12/17/2023   Brief hospital course:  53 y.o. 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 coli and Streptococcus anginosus who presents with hip pain MRI was done that showed a right psoas abscess and L4-L5 discitis and osteomyelitis   Assessment and Plan:  L4-L5 osteomyelitis/discitis with right psoas abscess - Coverage empirically with cefepime  plus vancomycin .  MRI noting a 2.2 x 1 x 4.1 cm right psoas abscess.  IR consulted for possible drainage however no safe access appreciated.  Not a surgical candidate.  Infectious disease consulted and following closely.  Continuing with conservative management for the time being.  ESRD on HD MWF - Nephrology following closely.  Dialysis today.  Chronic hypotension - Likely exacerbated by infection and poor p.o. intake.  Midodrine  on board.  Nephrology following closely during HD.  Uncontrolled diabetes mellitus - Previous A1c 10 suggesting very poor control.  Currently on insulin  sliding scale  HIV/chronic hep C - ID following.  Continue home HIV regiment.  Severe protein calorie malnutrition - Protein supplementation encouraged.  Bilateral BKA - Contributing to physical debilitation and muscle weakness.    Subjective: Patient resting comfortably will while receiving HD this morning.  Complaining of feeling hot in his lower back as well as intermittent pain.  Denies any fever, shortness of breath, chest pain, nausea, vomiting, abdominal pain.  Physical Exam:  Vitals:   12/17/23 1000 12/17/23 1030 12/17/23 1100 12/17/23  1130  BP: 114/80 100/63 103/66 104/70  Pulse: 88 90 86 87  Resp: 17 12 (!) 8 (!) 9  Temp:      TempSrc:      SpO2:   (!) 85% 96%  Weight:      Height:        GENERAL:  Alert, pleasant, frail, ill-appearing, disheveled HEENT:  EOMI CARDIOVASCULAR:  RRR, no murmurs appreciated RESPIRATORY:  Clear to auscultation, no wheezing, rales, or rhonchi GASTROINTESTINAL:  Soft, nontender, nondistended EXTREMITIES: BL BKA NEURO:  No new focal deficits appreciated SKIN:  No rashes noted PSYCH:  Appropriate mood and affect     Data Reviewed:  Imaging Studies: CT ABDOMEN PELVIS W CONTRAST Result Date: 12/16/2023 CLINICAL DATA:  Concern for right psoas abscess on recent sacral MRI. Right hip pain. EXAM: CT ABDOMEN AND PELVIS WITH CONTRAST TECHNIQUE: Multidetector CT imaging of the abdomen and pelvis was performed using the standard protocol following bolus administration of intravenous contrast. RADIATION DOSE REDUCTION: This exam was performed according to the departmental dose-optimization program which includes automated exposure control, adjustment of the mA and/or kV according to patient size and/or use of iterative reconstruction technique. CONTRAST:  75mL OMNIPAQUE  IOHEXOL  350 MG/ML SOLN COMPARISON:  Abdominopelvic CT 10/08/2023.  Sacral MRI 12/15/2023. FINDINGS: Technical note: Despite efforts by the technologist and patient, mild motion artifact is present on today's exam and could not be eliminated. This reduces exam sensitivity and specificity. Examination was performed with the patient in the right lateral decubitus position. Lower chest: Patchy scarring at both lung bases, similar to previous CT. Central line extends into the upper right atrium. There is coronary artery atherosclerosis. Hepatobiliary: The liver is normal in density without suspicious focal abnormality. Diffuse intra and extrahepatic biliary dilatation  is again noted, similar to previous CT. The gallbladder is mildly distended  without wall thickening or definite surrounding inflammation. Pancreas: Diffuse atrophy. No pancreatic ductal dilatation or surrounding inflammation identified. Spleen: Normal in size without focal abnormality. Adrenals/Urinary Tract: Both adrenal glands appear normal. Chronic renal cortical thinning and atrophy bilaterally with prominent renal vascular calcifications. No evidence of hydronephrosis or suspicious renal mass. The bladder appears unremarkable for its degree of distention. Stomach/Bowel: No enteric contrast administered. Mild generalized bowel wall thickening without significant distension or focal abnormality. Vascular/Lymphatic: There are no enlarged abdominal or pelvic lymph nodes. Severe aortic and branch vessel atherosclerosis without evidence of aneurysm or large vessel occlusion. Reproductive: The prostate gland and seminal vesicles appear unremarkable. Other: Mild generalized soft tissue edema consistent with anasarca. No ascites, focal extraluminal fluid collection or pneumoperitoneum. Musculoskeletal: As seen on recent pelvic MRI, there are findings of the L4-5 level suspicious for discitis and osteomyelitis. There are surrounding paraspinal inflammatory changes with heterogeneous enhancement in both psoas muscles. There is an asymmetric abscess on the right, measuring up to 3.9 cm in length on coronal image 52/6. Overall findings are similar to the recent MRI. The sacroiliac joints appear normal. Previous left femoral ORIF noted. IMPRESSION: 1. Findings of discitis and osteomyelitis at L4-5 with surrounding paraspinal inflammatory changes and right psoas abscess, similar to recent MRI. Again, this could be further evaluated with lumbar MRI. 2. No other acute findings identified in the abdomen or pelvis. 3. Chronic intra and extrahepatic biliary dilatation, similar to previous CT. 4. Chronic renal cortical thinning and atrophy bilaterally. 5.  Aortic Atherosclerosis (ICD10-I70.0).  Electronically Signed   By: Elsie Perone M.D.   On: 12/16/2023 13:04   MR SACRUM SI JOINTS W WO CONTRAST Result Date: 12/15/2023 CLINICAL DATA:  Septic arthritis suspected, hip, xray done Right hip pain. EXAM: MRI PELVIS WITHOUT AND WITH CONTRAST TECHNIQUE: Multiplanar multisequence MR imaging of the pelvis with attention to the sacroiliac joints was performed both before and after administration of intravenous contrast. CONTRAST:  5mL GADAVIST  GADOBUTROL  1 MMOL/ML IV SOLN COMPARISON:  Right hip radiographs 12/14/2023. MRI of the sacrum 10/15/2023 and MRI of the right hip 10/13/2023. FINDINGS: Bones/Joint/Cartilage No evidence of sacroiliitis, osteomyelitis or acute fracture within the visualized bony pelvis. However, there is new T2 hyperintensity and heterogeneous enhancement associated with the L4-5 disc and inferior endplate of L4, concerning for discitis and osteomyelitis. Paraspinal inflammatory changes are further described below. Mild degenerative changes at the hips and sacroiliac joints without significant joint effusions. Artifact from left femoral intramedullary nail noted. Ligaments No significant ligamentous abnormalities are identified. Muscles and Tendons Heterogeneous enhancement within the psoas muscles bilaterally with a peripherally enhancing fluid collection on the right measuring up to 2.2 x 1.2 x 4.1 cm, consistent with an abscess. No significant pelvic muscular abnormalities are identified. Soft tissue As above, findings suspicious for discitis and osteomyelitis at L4-5. There is heterogeneous paraspinal enhancement with extension into the psoas musculature as described above. No abscess or other focal fluid collection identified within the pelvis. IMPRESSION: 1. Findings are consistent with discitis and osteomyelitis at L4-5 with associated paraspinal inflammatory changes and right psoas abscess. Recommend dedicated lumbar MRI without and with contrast. 2. No evidence of sacroiliitis,  osteomyelitis or septic arthritis within the pelvis. 3. Mild degenerative changes at the hips and sacroiliac joints. Electronically Signed   By: Elsie Perone M.D.   On: 12/15/2023 10:36   DG Hip Unilat W or Wo Pelvis 2-3 Views Right Result Date:  12/14/2023 CLINICAL DATA:  Right hip pain. EXAM: DG HIP (WITH OR WITHOUT PELVIS) 2-3V RIGHT COMPARISON:  None Available. FINDINGS: There is no evidence of acute hip fracture or dislocation. A radiopaque intramedullary rod and associated fixation screw are seen within the proximal left femoral shaft. There is no evidence of arthropathy or other focal bone abnormality. Marked severity vascular calcification is noted. IMPRESSION: 1. No acute findings. 2. Prior ORIF of the left femur. Electronically Signed   By: Suzen Dials M.D.   On: 12/14/2023 23:29    There are no new results to review at this time.  Previous records (including but not limited to H&P, progress notes, nursing notes, TOC management) were reviewed in assessment of this patient.  Labs: CBC: Recent Labs  Lab 12/14/23 2332 12/17/23 0840  WBC 10.1 9.1  NEUTROABS 7.7  --   HGB 10.0* 9.0*  HCT 30.9* 28.9*  MCV 99.7 101.0*  PLT 249 249   Basic Metabolic Panel: Recent Labs  Lab 12/14/23 2332 12/15/23 0608 12/17/23 0840  NA 131* 134* 132*  K 3.7 3.4* 4.1  CL 94* 93* 93*  CO2 23 23 22   GLUCOSE 85 60* 161*  BUN 37* 39* 29*  CREATININE 8.38* 9.06* 6.75*  CALCIUM  9.0 9.2 8.8*  PHOS  --   --  7.3*   Liver Function Tests: Recent Labs  Lab 12/15/23 0608 12/17/23 0840  AST 19  --   ALT 9  --   ALKPHOS 74  --   BILITOT 1.8*  --   PROT 10.3*  --   ALBUMIN  2.7* 2.3*   CBG: Recent Labs  Lab 12/16/23 0738 12/16/23 1133 12/16/23 1625 12/16/23 2145 12/17/23 0734  GLUCAP 119* 140* 124* 189* 145*    Scheduled Meds:  acetaminophen   1,000 mg Oral TID   bictegravir-emtricitabine -tenofovir  AF  1 tablet Oral Daily   Chlorhexidine  Gluconate Cloth  6 each Topical Q0600    gabapentin   100 mg Oral TID   heparin   5,000 Units Subcutaneous Q8H   heparin  sodium (porcine)  4,000 Units Intracatheter Once   insulin  aspart  0-5 Units Subcutaneous QHS   insulin  aspart  0-6 Units Subcutaneous TID WC   insulin  aspart  2 Units Subcutaneous TID WC   methocarbamol   500 mg Oral TID   sodium chloride  flush  10-40 mL Intracatheter Q12H   Continuous Infusions:  ceFEPime  (MAXIPIME ) IV Stopped (12/16/23 2218)   vancomycin  Stopped (12/16/23 0327)   PRN Meds:.HYDROmorphone  (DILAUDID ) injection, midodrine , oxyCODONE , sodium chloride  flush  Family Communication: None at bedside  Disposition: Status is: Inpatient Remains inpatient appropriate because: Discitis, psoas abscess     Time spent: 51 minutes  Length of inpatient stay: 2 days  Author: Carliss LELON Canales, DO 12/17/2023 12:06 PM  For on call review [email or web address].

## 2023-12-17 NOTE — Progress Notes (Signed)
 [facility] Progress Note   Subjective:  Patient seen in HD this morning. His BP was low during treatment, was given IVF and albumin  to help with his BP. He is very sensitive to UF despite taking midodrine  before treatment. He did complain of some pain in his right leg. No dyspnea or CP.   Objective Vitals:   12/17/23 1156 12/17/23 1210 12/17/23 1211 12/17/23 1301  BP: 115/65 (!) 116/57 (!) 107/59 95/70  Pulse: 89 89 91   Resp: 20 17 18    Temp:   97.7 F (36.5 C) 98.1 F (36.7 C)  TempSrc:    Oral  SpO2: 100% 100% 96%   Weight:      Height:       Exam Gen alert, no distress, 100% on RA No rash, cyanosis or gangrene Sclera anicteric, throat clear  No jvd or bruits Chest clear bilat to bases, no rales/ wheezing RRR no MRG Abd soft ntnd no mass or ascites +bs GU deferred MS bilat LE amputee Ext no LE or UE edema, no other edema Neuro is alert, Ox 3 , nf    RIJ TDC intact  Additional Objective Labs: Basic Metabolic Panel: Recent Labs  Lab 12/14/23 2332 12/15/23 0608 12/17/23 0840  NA 131* 134* 132*  K 3.7 3.4* 4.1  CL 94* 93* 93*  CO2 23 23 22   GLUCOSE 85 60* 161*  BUN 37* 39* 29*  CREATININE 8.38* 9.06* 6.75*  CALCIUM  9.0 9.2 8.8*  PHOS  --   --  7.3*   Liver Function Tests: Recent Labs  Lab 12/15/23 0608 12/17/23 0840  AST 19  --   ALT 9  --   ALKPHOS 74  --   BILITOT 1.8*  --   PROT 10.3*  --   ALBUMIN  2.7* 2.3*   No results for input(s): LIPASE, AMYLASE in the last 168 hours. CBC: Recent Labs  Lab 12/14/23 2332 12/17/23 0840  WBC 10.1 9.1  NEUTROABS 7.7  --   HGB 10.0* 9.0*  HCT 30.9* 28.9*  MCV 99.7 101.0*  PLT 249 249   CBG: Recent Labs  Lab 12/16/23 0738 12/16/23 1133 12/16/23 1625 12/16/23 2145 12/17/23 0734  GLUCAP 119* 140* 124* 189* 145*     Medications:  ceFEPime  (MAXIPIME ) IV Stopped (12/16/23 2218)   vancomycin  Stopped (12/16/23 0327)    acetaminophen   1,000 mg Oral TID    bictegravir-emtricitabine -tenofovir  AF  1 tablet Oral Daily   Chlorhexidine  Gluconate Cloth  6 each Topical Q0600   gabapentin   100 mg Oral TID   heparin   5,000 Units Subcutaneous Q8H   heparin  sodium (porcine)  4,000 Units Intracatheter Once   insulin  aspart  0-5 Units Subcutaneous QHS   insulin  aspart  0-6 Units Subcutaneous TID WC   insulin  aspart  2 Units Subcutaneous TID WC   methocarbamol   500 mg Oral TID   sodium chloride  flush  10-40 mL Intracatheter Q12H    Home bp meds: Midodrine  10mg  tid prn for SBP < 100 Midodrine  10mg  pre HD mwf prn    OP HD: MWF NW 3.5h  B400  45.5kg  TDC  Heparin  2000     Assessment/ Plan: 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 Blood cx's sent, IV vanc/ cefepime  started. Infectious disease is on board and they recommend 6-8 week course with repeat imaging.  ESRD: on HD MWF. Had HD overnight and 600 mL Uf'd. Next HD 12/17/23 BP: chronic low BP on midodrine  10 tid prn  and 10mg  pre HD mwf as needed for SBP < 100. Can give albumin  during HD for low BP.  Volume: not a big volume gainer, limits wt gains. 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 HIV: controlled on Biktarvy    Belvie Och, NP 12/17/2023, 1:07 PM  [facility]

## 2023-12-18 LAB — CBC
HCT: 29.7 % — ABNORMAL LOW (ref 39.0–52.0)
Hemoglobin: 9.4 g/dL — ABNORMAL LOW (ref 13.0–17.0)
MCH: 32.3 pg (ref 26.0–34.0)
MCHC: 31.6 g/dL (ref 30.0–36.0)
MCV: 102.1 fL — ABNORMAL HIGH (ref 80.0–100.0)
Platelets: 261 K/uL (ref 150–400)
RBC: 2.91 MIL/uL — ABNORMAL LOW (ref 4.22–5.81)
RDW: 15.2 % (ref 11.5–15.5)
WBC: 10.9 K/uL — ABNORMAL HIGH (ref 4.0–10.5)
nRBC: 0 % (ref 0.0–0.2)

## 2023-12-18 LAB — SEDIMENTATION RATE: Sed Rate: 122 mm/h — ABNORMAL HIGH (ref 0–16)

## 2023-12-18 LAB — C-REACTIVE PROTEIN: CRP: 18.7 mg/dL — ABNORMAL HIGH (ref <1.0)

## 2023-12-18 LAB — BASIC METABOLIC PANEL WITH GFR
Anion gap: 13 (ref 5–15)
BUN: 19 mg/dL (ref 6–20)
CO2: 23 mmol/L (ref 22–32)
Calcium: 9 mg/dL (ref 8.9–10.3)
Chloride: 97 mmol/L — ABNORMAL LOW (ref 98–111)
Creatinine, Ser: 5.01 mg/dL — ABNORMAL HIGH (ref 0.61–1.24)
GFR, Estimated: 13 mL/min — ABNORMAL LOW (ref >60)
Glucose, Bld: 162 mg/dL — ABNORMAL HIGH (ref 70–99)
Potassium: 4.3 mmol/L (ref 3.5–5.1)
Sodium: 133 mmol/L — ABNORMAL LOW (ref 135–145)

## 2023-12-18 LAB — GLUCOSE, CAPILLARY
Glucose-Capillary: 140 mg/dL — ABNORMAL HIGH (ref 70–99)
Glucose-Capillary: 78 mg/dL (ref 70–99)
Glucose-Capillary: 110 mg/dL — ABNORMAL HIGH (ref 70–99)
Glucose-Capillary: 147 mg/dL — ABNORMAL HIGH (ref 70–99)

## 2023-12-18 LAB — MAGNESIUM: Magnesium: 2 mg/dL (ref 1.7–2.4)

## 2023-12-18 MED ORDER — SEVELAMER CARBONATE 800 MG PO TABS
800.0000 mg | ORAL_TABLET | Freq: Three times a day (TID) | ORAL | Status: DC
  Administered 2023-12-21 – 2024-02-21 (×164): 800 mg via ORAL
  Filled 2023-12-18 (×164): qty 1

## 2023-12-18 MED ORDER — CHLORHEXIDINE GLUCONATE CLOTH 2 % EX PADS
6.0000 | MEDICATED_PAD | Freq: Every day | CUTANEOUS | Status: DC
  Administered 2023-12-19 – 2023-12-21 (×3): 6 via TOPICAL

## 2023-12-18 NOTE — Progress Notes (Signed)
 [facility] Progress Note   Subjective:  Patient seen in his room this morning. He reports that he is very hungry, but he has diet ordered. We discussed with his RN and he was going to get him a tray. Patient is scheduled to have MRI with contrast tomorrow morning for imaging of his spine; will schedule HD for afterwards. He reports back pain and right UE pain r/t infection. Not a surgical candidate per ID. He states that he is ready to go home. Last HD 12/18/23 with minimal UF. Next HD 12/19/23 second shift.   Objective Vitals:   12/17/23 1947 12/17/23 2048 12/18/23 0449 12/18/23 0742  BP: 93/76 90/65 127/76 110/68  Pulse: 89  63 63  Resp: 16  18   Temp: 98.7 F (37.1 C)  98.5 F (36.9 C)   TempSrc: Oral  Oral   SpO2: 98%  99% 99%  Weight:      Height:       Exam Gen alert, sitting up in bed, no distress, 100% on RA No rash, cyanosis or gangrene Sclera anicteric, throat clear  No jvd or bruits Chest clear bilat to bases, no rales/ wheezing RRR no MRG Abd soft ntnd no mass or ascites +bs GU deferred MS bilat LE amputee Ext no LE or UE edema, no other edema Neuro is alert, Ox 3 , nf    RIJ TDC intact  Additional Objective Labs: Basic Metabolic Panel: Recent Labs  Lab 12/15/23 0608 12/17/23 0840 12/18/23 0635  NA 134* 132* 133*  K 3.4* 4.1 4.3  CL 93* 93* 97*  CO2 23 22 23   GLUCOSE 60* 161* 162*  BUN 39* 29* 19  CREATININE 9.06* 6.75* 5.01*  CALCIUM  9.2 8.8* 9.0  PHOS  --  7.3*  --    Liver Function Tests: Recent Labs  Lab 12/15/23 0608 12/17/23 0840  AST 19  --   ALT 9  --   ALKPHOS 74  --   BILITOT 1.8*  --   PROT 10.3*  --   ALBUMIN  2.7* 2.3*   No results for input(s): LIPASE, AMYLASE in the last 168 hours. CBC: Recent Labs  Lab 12/14/23 2332 12/17/23 0840 12/18/23 0635  WBC 10.1 9.1 10.9*  NEUTROABS 7.7  --   --   HGB 10.0* 9.0* 9.4*  HCT 30.9* 28.9* 29.7*  MCV 99.7 101.0* 102.1*  PLT 249 249 261   CBG: Recent Labs  Lab  12/17/23 1343 12/17/23 1646 12/17/23 1949 12/18/23 0742 12/18/23 1151  GLUCAP 113* 133* 149* 140* 78     Medications:  ceFEPime  (MAXIPIME ) IV Stopped (12/17/23 2302)   vancomycin  Stopped (12/17/23 1456)    acetaminophen   1,000 mg Oral TID   bictegravir-emtricitabine -tenofovir  AF  1 tablet Oral Daily   Chlorhexidine  Gluconate Cloth  6 each Topical Q0600   gabapentin   100 mg Oral TID   heparin   5,000 Units Subcutaneous Q8H   heparin  sodium (porcine)  4,000 Units Intracatheter Once   insulin  aspart  0-5 Units Subcutaneous QHS   insulin  aspart  0-6 Units Subcutaneous TID WC   insulin  aspart  2 Units Subcutaneous TID WC   methocarbamol   500 mg Oral TID   sodium chloride  flush  10-40 mL Intracatheter Q12H    Home bp meds: Midodrine  10mg  tid prn for SBP < 100 Midodrine  10mg  pre HD mwf prn    OP HD: MWF NW 3.5h  B400  45.5kg  TDC  Heparin  2000 Mircera 100 mcg - last dose 12/01/23  Calcitriol  1.0 mcg (on hold due to high Ca)     Assessment/ Plan: 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 Blood cx's sent, IV vanc/ cefepime  started. Infectious disease is on board and they recommend 6-8 week course with repeat imaging.  ESRD: on HD MWF. Had HD overnight and 600 mL Uf'd. Next HD 12/19/23 BP: chronic low BP on midodrine  10 tid prn and 10mg  pre HD mwf as needed for SBP < 100. Can give albumin  during HD for low BP.  Volume: not a big volume gainer, limits wt gains. 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 HIV: controlled on Biktarvy  BMD: Corrected Ca 10.4 and phos high. Did not see phos binder on his OP med rec. Start sevelamer  TID WC. VDRA on hold due to high Ca.    Belvie Och, NP 12/18/2023, 12:50 PM  [facility]

## 2023-12-18 NOTE — TOC Progression Note (Incomplete)
 Transition of Care [facility]) - Progression Note    Patient Details  Name: Alvin Daniels MRN: [id number] Date of Birth: 01-24-1971  [facility] CM/SW Contact  Roxie KANDICE Stain, RN Phone Number: 12/18/2023, 4:22 PM  Clinical Narrative:    Used spanish translator ID # [phone] and name Meribeth.   Spoke to patient regarding transition needs. Patient states he is not a [place]  citizen and doesn't know why he has a Orthoptist. Patient is agreeable for this RNCM to speak to family and friend. Alyse Spotted, was on the call. Gillermia states patient does live alone and she would be able to assist with the iv  antibiotics at home if needed. Patient goes to dialysis MWF and could possible receive the antibiotics   there. Per Charmaine Piety, NP with nephrology patient can receive the antibiotics at Dialysis Patient takes the bus to dialysis. .  Expected Discharge Plan: Home/Self Care Barriers to Discharge: Continued Medical Work up               Expected Discharge Plan and Services       Living arrangements for the past 2 months: Single Family Home                                       Social Drivers of Health (SDOH) Interventions SDOH Screenings   Food Insecurity: No Food Insecurity (12/15/2023)  Housing: Low Risk  (12/15/2023)  Transportation Needs: No Transportation Needs (12/15/2023)  Utilities: Not At Risk (12/15/2023)  Alcohol  Screen: Low Risk  (07/17/2023)  Depression (PHQ2-9): Low Risk  (07/17/2023)  Financial Resource Strain: Low Risk  (01/29/2023)  Physical Activity: Inactive (01/29/2023)  Social Connections: Moderately Isolated (10/08/2023)  Stress: No Stress Concern Present (01/29/2023)  Tobacco Use: Low Risk  (12/14/2023)  Health Literacy: Adequate Health Literacy (01/29/2023)    Readmission Risk Interventions     No data to display

## 2023-12-18 NOTE — Progress Notes (Signed)
 Progress Note   Patient: Alvin Daniels FMW:[id number] DOB: 1970/11/18 DOA: 12/14/2023  DOS: the patient was seen and examined on 12/18/2023   Brief hospital course:  53 y.o. 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 coli and Streptococcus anginosus who presents with hip pain MRI was done that showed a right psoas abscess and L4-L5 discitis and osteomyelitis    Assessment and Plan:   L4-L5 osteomyelitis/discitis with right psoas abscess - Coverage empirically with cefepime  plus vancomycin .  MRI noting a 2.2 x 1 x 4.1 cm right psoas abscess.  IR consulted for possible drainage however no safe access appreciated.  Not a surgical candidate.  Infectious disease consulted and following closely.  Continuing with conservative management for the time being.  Likely to continue cefepime  plus vancomycin  IV in the outpatient setting or at SNF.   ESRD on HD MWF - Nephrology following closely.  Some hypotension with dialysis yesterday.   Chronic hypotension - Likely exacerbated by infection and poor p.o. intake.  Midodrine  on board.  Nephrology following closely during HD.   Uncontrolled diabetes mellitus - Previous A1c 10 suggesting very poor control.  Currently on insulin  sliding scale.   HIV/chronic hep C - ID following.  Continue Biktarvy .   Severe protein calorie malnutrition - Protein supplementation encouraged.   Bilateral BKA - Contributing to physical debilitation and muscle weakness.  BL prosthetics in room.  Goals of care - Disposition planning with patient, TOC, infectious disease, nephrology.  Subjective: Patient resting comfortably this morning.  States he feels a bit better than yesterday.   No longer hot, states improved pain.  Denies any fever, shortness of breath, chest pain, nausea, vomiting, abdominal pain.  Physical Exam:  Vitals:   12/17/23 1947 12/17/23 2048 12/18/23 0449 12/18/23 0742  BP: 93/76 90/65 127/76 110/68  Pulse: 89  63 63  Resp: 16  18   Temp: 98.7 F (37.1 C)  98.5 F (36.9 C)   TempSrc: Oral  Oral   SpO2: 98%  99% 99%  Weight:      Height:        GENERAL:  Alert, pleasant, frail, ill-appearing, disheveled HEENT:  EOMI CARDIOVASCULAR:  RRR, no murmurs appreciated RESPIRATORY:  Clear to auscultation, no wheezing, rales, or rhonchi GASTROINTESTINAL:  Soft, nontender, nondistended EXTREMITIES: BL BKA NEURO:  No new focal deficits appreciated SKIN:  No rashes noted PSYCH:  Appropriate mood and affect    Data Reviewed:  Imaging Studies: CT ABDOMEN PELVIS W CONTRAST Result Date: 12/16/2023 CLINICAL DATA:  Concern for right psoas abscess on recent sacral MRI. Right hip pain. EXAM: CT ABDOMEN AND PELVIS WITH CONTRAST TECHNIQUE: Multidetector CT imaging of the abdomen and pelvis was performed using the standard protocol following bolus administration of intravenous contrast. RADIATION DOSE REDUCTION: This exam was performed according to the departmental dose-optimization program which includes automated exposure control, adjustment of the mA and/or kV according to patient size and/or use of iterative reconstruction technique. CONTRAST:  75mL OMNIPAQUE  IOHEXOL  350 MG/ML SOLN COMPARISON:  Abdominopelvic CT 10/08/2023.  Sacral MRI 12/15/2023. FINDINGS: Technical note: Despite efforts by the technologist and patient, mild motion artifact is present on today's exam and could not be eliminated. This reduces exam sensitivity and specificity. Examination was performed with the patient in the right lateral decubitus position. Lower chest:  Patchy scarring at both lung bases, similar to previous CT. Central line extends into the upper right atrium. There is coronary  artery atherosclerosis. Hepatobiliary: The liver is normal in density without suspicious focal abnormality. Diffuse intra and extrahepatic biliary dilatation is again noted, similar to previous CT. The gallbladder is mildly distended without wall thickening or definite surrounding inflammation. Pancreas: Diffuse atrophy. No pancreatic ductal dilatation or surrounding inflammation identified. Spleen: Normal in size without focal abnormality. Adrenals/Urinary Tract: Both adrenal glands appear normal. Chronic renal cortical thinning and atrophy bilaterally with prominent renal vascular calcifications. No evidence of hydronephrosis or suspicious renal mass. The bladder appears unremarkable for its degree of distention. Stomach/Bowel: No enteric contrast administered. Mild generalized bowel wall thickening without significant distension or focal abnormality. Vascular/Lymphatic: There are no enlarged abdominal or pelvic lymph nodes. Severe aortic and branch vessel atherosclerosis without evidence of aneurysm or large vessel occlusion. Reproductive: The prostate gland and seminal vesicles appear unremarkable. Other: Mild generalized soft tissue edema consistent with anasarca. No ascites, focal extraluminal fluid collection or pneumoperitoneum. Musculoskeletal: As seen on recent pelvic MRI, there are findings of the L4-5 level suspicious for discitis and osteomyelitis. There are surrounding paraspinal inflammatory changes with heterogeneous enhancement in both psoas muscles. There is an asymmetric abscess on the right, measuring up to 3.9 cm in length on coronal image 52/6. Overall findings are similar to the recent MRI. The sacroiliac joints appear normal. Previous left femoral ORIF noted. IMPRESSION: 1. Findings of discitis and osteomyelitis at L4-5 with surrounding paraspinal inflammatory changes and right psoas abscess, similar to recent MRI. Again, this could be further evaluated with lumbar MRI. 2. No other acute  findings identified in the abdomen or pelvis. 3. Chronic intra and extrahepatic biliary dilatation, similar to previous CT. 4. Chronic renal cortical thinning and atrophy bilaterally. 5.  Aortic Atherosclerosis (ICD10-I70.0). Electronically Signed   By: Elsie Perone M.D.   On: 12/16/2023 13:04   MR SACRUM SI JOINTS W WO CONTRAST Result Date: 12/15/2023 CLINICAL DATA:  Septic arthritis suspected, hip, xray done Right hip pain. EXAM: MRI PELVIS WITHOUT AND WITH CONTRAST TECHNIQUE: Multiplanar multisequence MR imaging of the pelvis with attention to the sacroiliac joints was performed both before and after administration of intravenous contrast. CONTRAST:  5mL GADAVIST  GADOBUTROL  1 MMOL/ML IV SOLN COMPARISON:  Right hip radiographs 12/14/2023. MRI of the sacrum 10/15/2023 and MRI of the right hip 10/13/2023. FINDINGS: Bones/Joint/Cartilage No evidence of sacroiliitis, osteomyelitis or acute fracture within the visualized bony pelvis. However, there is new T2 hyperintensity and heterogeneous enhancement associated with the L4-5 disc and inferior endplate of L4, concerning for discitis and osteomyelitis. Paraspinal inflammatory changes are further described below. Mild degenerative changes at the hips and sacroiliac joints without significant joint effusions. Artifact from left femoral intramedullary nail noted. Ligaments No significant ligamentous abnormalities are identified. Muscles and Tendons Heterogeneous enhancement within the psoas muscles bilaterally with a peripherally enhancing fluid collection on the right measuring up to 2.2 x 1.2 x 4.1 cm, consistent with an abscess. No significant pelvic muscular abnormalities are identified. Soft tissue As above, findings suspicious for discitis and osteomyelitis at L4-5. There is heterogeneous paraspinal enhancement with extension into the psoas musculature as described above. No abscess or other focal fluid collection identified within the pelvis. IMPRESSION: 1.  Findings are consistent with discitis and osteomyelitis at L4-5 with associated paraspinal inflammatory changes and right psoas abscess. Recommend dedicated lumbar MRI without and with contrast. 2. No evidence of sacroiliitis, osteomyelitis or septic arthritis within  the pelvis. 3. Mild degenerative changes at the hips and sacroiliac joints. Electronically Signed   By: Elsie Perone M.D.   On: 12/15/2023 10:36   DG Hip Unilat W or Wo Pelvis 2-3 Views Right Result Date: 12/14/2023 CLINICAL DATA:  Right hip pain. EXAM: DG HIP (WITH OR WITHOUT PELVIS) 2-3V RIGHT COMPARISON:  None Available. FINDINGS: There is no evidence of acute hip fracture or dislocation. A radiopaque intramedullary rod and associated fixation screw are seen within the proximal left femoral shaft. There is no evidence of arthropathy or other focal bone abnormality. Marked severity vascular calcification is noted. IMPRESSION: 1. No acute findings. 2. Prior ORIF of the left femur. Electronically Signed   By: Suzen Dials M.D.   On: 12/14/2023 23:29    There are no new results to review at this time.  Previous records (including but not limited to H&P, progress notes, nursing notes, TOC management) were reviewed in assessment of this patient.  Labs: CBC: Recent Labs  Lab 12/14/23 2332 12/17/23 0840 12/18/23 0635  WBC 10.1 9.1 10.9*  NEUTROABS 7.7  --   --   HGB 10.0* 9.0* 9.4*  HCT 30.9* 28.9* 29.7*  MCV 99.7 101.0* 102.1*  PLT 249 249 261   Basic Metabolic Panel: Recent Labs  Lab 12/14/23 2332 12/15/23 0608 12/17/23 0840 12/18/23 0635  NA 131* 134* 132* 133*  K 3.7 3.4* 4.1 4.3  CL 94* 93* 93* 97*  CO2 23 23 22 23   GLUCOSE 85 60* 161* 162*  BUN 37* 39* 29* 19  CREATININE 8.38* 9.06* 6.75* 5.01*  CALCIUM  9.0 9.2 8.8* 9.0  MG  --   --   --  2.0  PHOS  --   --  7.3*  --    Liver Function Tests: Recent Labs  Lab 12/15/23 0608 12/17/23 0840  AST 19  --   ALT 9  --   ALKPHOS 74  --   BILITOT 1.8*  --    PROT 10.3*  --   ALBUMIN  2.7* 2.3*   CBG: Recent Labs  Lab 12/17/23 0734 12/17/23 1343 12/17/23 1646 12/17/23 1949 12/18/23 0742  GLUCAP 145* 113* 133* 149* 140*    Scheduled Meds:  acetaminophen   1,000 mg Oral TID   bictegravir-emtricitabine -tenofovir  AF  1 tablet Oral Daily   Chlorhexidine  Gluconate Cloth  6 each Topical Q0600   gabapentin   100 mg Oral TID   heparin   5,000 Units Subcutaneous Q8H   heparin  sodium (porcine)  4,000 Units Intracatheter Once   insulin  aspart  0-5 Units Subcutaneous QHS   insulin  aspart  0-6 Units Subcutaneous TID WC   insulin  aspart  2 Units Subcutaneous TID WC   methocarbamol   500 mg Oral TID   sodium chloride  flush  10-40 mL Intracatheter Q12H   Continuous Infusions:  ceFEPime  (MAXIPIME ) IV Stopped (12/17/23 2302)   vancomycin  Stopped (12/17/23 1456)   PRN Meds:.HYDROmorphone  (DILAUDID ) injection, midodrine , oxyCODONE , sodium chloride  flush  Family Communication: None at bedside  Disposition: Status is: Inpatient Remains inpatient appropriate because: Discitis, psoas abscess     Time spent: 38 minutes  Length of inpatient stay: 3 days  Author: Carliss LELON Canales, DO 12/18/2023 11:37 AM  For on call review [email or web address].

## 2023-12-18 NOTE — Progress Notes (Signed)
 Contacted by hospital CSW with inquiry whether pt has insurance or not. Contacted [facility] staff to inquire who pt's HD treatments are billed to. Awaiting response. Will assist as needed.   Randine Mungo Dialysis Navigator [phone]

## 2023-12-18 NOTE — Progress Notes (Incomplete)
 PHARMACY CONSULT NOTE FOR:  OUTPATIENT  PARENTERAL ANTIBIOTIC THERAPY (OPAT)  Informational as the patient will receive antibiotics at the HD center outpatient  Indication: L4-5 discitis/osteo with psoas abscess Regimen: Vancomycin  500 mg/HD-MWF + Cefepime  2g/HD-MWF End date: 02/09/24 (8 weeks from start on 8/4)  IV antibiotic discharge orders are pended. To discharging provider:  please sign these orders via discharge navigator,  Select New Orders & click on the button choice - Manage This Unsigned Work.     Thank you for allowing pharmacy to be a part of this patient's care.  Almarie Lunger, PharmD, BCPS, BCIDP Infectious Diseases Clinical Pharmacist 12/19/2023 1:00 PM   **Pharmacist phone directory can now be found on [email or web address] ([email or web address]).  Listed under [facility].

## 2023-12-18 NOTE — Plan of Care (Signed)

## 2023-12-19 ENCOUNTER — Inpatient Hospital Stay (HOSPITAL_COMMUNITY): Payer: MEDICAID

## 2023-12-19 DIAGNOSIS — G062 Extradural and subdural abscess, unspecified: Secondary | ICD-10-CM

## 2023-12-19 DIAGNOSIS — M869 Osteomyelitis, unspecified: Secondary | ICD-10-CM

## 2023-12-19 DIAGNOSIS — M4620 Osteomyelitis of vertebra, site unspecified: Secondary | ICD-10-CM

## 2023-12-19 LAB — BASIC METABOLIC PANEL WITH GFR
Anion gap: 18 — ABNORMAL HIGH (ref 5–15)
BUN: 30 mg/dL — ABNORMAL HIGH (ref 6–20)
CO2: 19 mmol/L — ABNORMAL LOW (ref 22–32)
Calcium: 9.1 mg/dL (ref 8.9–10.3)
Chloride: 99 mmol/L (ref 98–111)
Creatinine, Ser: 6.48 mg/dL — ABNORMAL HIGH (ref 0.61–1.24)
GFR, Estimated: 10 mL/min — ABNORMAL LOW (ref >60)
Glucose, Bld: 142 mg/dL — ABNORMAL HIGH (ref 70–99)
Potassium: 4.7 mmol/L (ref 3.5–5.1)
Sodium: 136 mmol/L (ref 135–145)

## 2023-12-19 LAB — GLUCOSE, CAPILLARY
Glucose-Capillary: 136 mg/dL — ABNORMAL HIGH (ref 70–99)
Glucose-Capillary: 142 mg/dL — ABNORMAL HIGH (ref 70–99)
Glucose-Capillary: 78 mg/dL (ref 70–99)
Glucose-Capillary: 128 mg/dL — ABNORMAL HIGH (ref 70–99)

## 2023-12-19 LAB — CBC
HCT: 30.8 % — ABNORMAL LOW (ref 39.0–52.0)
Hemoglobin: 9.7 g/dL — ABNORMAL LOW (ref 13.0–17.0)
MCH: 31.6 pg (ref 26.0–34.0)
MCHC: 31.5 g/dL (ref 30.0–36.0)
MCV: 100.3 fL — ABNORMAL HIGH (ref 80.0–100.0)
Platelets: 257 K/uL (ref 150–400)
RBC: 3.07 MIL/uL — ABNORMAL LOW (ref 4.22–5.81)
RDW: 15.1 % (ref 11.5–15.5)
WBC: 8.1 K/uL (ref 4.0–10.5)
nRBC: 0 % (ref 0.0–0.2)

## 2023-12-19 LAB — MAGNESIUM: Magnesium: 2.2 mg/dL (ref 1.7–2.4)

## 2023-12-19 LAB — PHOSPHORUS: Phosphorus: 5.6 mg/dL — ABNORMAL HIGH (ref 2.5–4.6)

## 2023-12-19 MED ORDER — LIDOCAINE HCL (PF) 1 % IJ SOLN
5.0000 mL | INTRAMUSCULAR | Status: DC | PRN
Start: 2023-12-19 — End: 2023-12-19

## 2023-12-19 MED ORDER — MIDODRINE HCL 5 MG PO TABS
ORAL_TABLET | ORAL | Status: AC
  Filled 2023-12-19: qty 2

## 2023-12-19 MED ORDER — LIDOCAINE-PRILOCAINE 2.5-2.5 % EX CREA: 1.0000 | TOPICAL_CREAM | CUTANEOUS | Status: DC | PRN

## 2023-12-19 MED ORDER — ALTEPLASE 2 MG IJ SOLR: 2.0000 mg | Freq: Once | INTRAMUSCULAR | Status: DC | PRN

## 2023-12-19 MED ORDER — HEPARIN SODIUM (PORCINE) 1000 UNIT/ML IJ SOLN
INTRAMUSCULAR | Status: AC
  Filled 2023-12-19: qty 2

## 2023-12-19 MED ORDER — CEFEPIME IV (FOR PTA / DISCHARGE USE ONLY): 2.0000 g | INTRAVENOUS | Status: AC

## 2023-12-19 MED ORDER — HEPARIN SODIUM (PORCINE) 1000 UNIT/ML DIALYSIS
2000.0000 [IU] | INTRAMUSCULAR | Status: DC | PRN
  Administered 2023-12-19: 2000 [IU] via INTRAVENOUS_CENTRAL

## 2023-12-19 MED ORDER — VANCOMYCIN IV (FOR PTA / DISCHARGE USE ONLY): 500.0000 mg | INTRAVENOUS | Status: AC

## 2023-12-19 MED ORDER — ALBUMIN HUMAN 25 % IV SOLN
25.0000 g | Freq: Once | INTRAVENOUS | Status: AC | PRN
  Administered 2023-12-19: 25 g via INTRAVENOUS

## 2023-12-19 MED ORDER — LIDOCAINE 5 % EX PTCH
1.0000 | MEDICATED_PATCH | CUTANEOUS | Status: DC
  Administered 2023-12-19 – 2023-12-31 (×16): 1 via TRANSDERMAL
  Filled 2023-12-19 (×12): qty 1

## 2023-12-19 MED ORDER — ANTICOAGULANT SODIUM CITRATE 4% (200MG/5ML) IV SOLN: 5.0000 mL | Status: DC | PRN

## 2023-12-19 MED ORDER — ALBUMIN HUMAN 25 % IV SOLN
INTRAVENOUS | Status: AC
  Filled 2023-12-19: qty 100

## 2023-12-19 MED ORDER — GADOBUTROL 1 MMOL/ML IV SOLN
5.0000 mL | Freq: Once | INTRAVENOUS | Status: AC | PRN
  Administered 2023-12-19: 5 mL via INTRAVENOUS

## 2023-12-19 MED ORDER — HEPARIN SODIUM (PORCINE) 1000 UNIT/ML IJ SOLN
3200.0000 [IU] | Freq: Once | INTRAMUSCULAR | Status: AC
  Administered 2023-12-19: 3200 [IU]

## 2023-12-19 MED ORDER — HEPARIN SODIUM (PORCINE) 1000 UNIT/ML IJ SOLN
INTRAMUSCULAR | Status: AC
  Filled 2023-12-19: qty 4

## 2023-12-19 MED ORDER — PENTAFLUOROPROP-TETRAFLUOROETH EX AERO
1.0000 | INHALATION_SPRAY | CUTANEOUS | Status: DC | PRN
Start: 2023-12-19 — End: 2023-12-19

## 2023-12-19 MED ORDER — VANCOMYCIN HCL 500 MG/100ML IV SOLN
INTRAVENOUS | Status: AC
  Filled 2023-12-19: qty 100

## 2023-12-19 MED ORDER — HEPARIN SODIUM (PORCINE) 1000 UNIT/ML DIALYSIS: 1000.0000 [IU] | INTRAMUSCULAR | Status: DC | PRN

## 2023-12-19 NOTE — Progress Notes (Signed)
 Progress Note   Patient: Alvin Daniels FMW:[id number] DOB: 05-17-1970 DOA: 12/14/2023  DOS: the patient was seen and examined on 12/19/2023   Brief hospital course:  53 y.o. 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 coli and Streptococcus anginosus who presents with hip pain MRI was done that showed a right psoas abscess and L4-L5 discitis and osteomyelitis    Assessment and Plan:   L4-L5 osteomyelitis/discitis with right psoas abscess - Coverage empirically with cefepime  plus vancomycin .  MRI sacrum noting a 2.2 x 1 x 4.1 cm right psoas abscess.  Repeat MRI this morning noting discitis/osteomyelitis at L4-L5, right psoas abscess, and epidural phlegmon.  Also noting small paraspinal abscess anterior to L4-L5 as well as possible small left psoas abscess.  IR consulted for possible drainage however no safe access appreciated.  Not a surgical candidate.  Infectious disease consulted and following closely.  Continuing with conservative management for the time being.  Likely to continue cefepime  plus vancomycin  IV in the outpatient setting or at SNF.   ESRD on HD MWF - Nephrology following closely.  Dialysis scheduled for later today.   Chronic hypotension - Likely exacerbated by infection and poor p.o. intake.  Midodrine  on board.  Nephrology following closely during HD.   Uncontrolled diabetes mellitus - Previous A1c 10 suggesting very poor control.  Currently on insulin  sliding scale.   HIV/chronic hep C - ID following.  Continue Biktarvy .   Severe protein calorie malnutrition - Protein supplementation encouraged.   Bilateral BKA - Contributing to physical debilitation and muscle weakness.  BL  prosthetics in room.   Goals of care - Disposition planning with patient, TOC, infectious disease, nephrology.  Patient will need ongoing IV cefepime  plus vancomycin  which will need to be conveyed with dialysis center.   anticipated duration of antibiotics will be 8 weeks.  Repeat MRI recommended by infectious disease as well.  Working closely with TOC to ensure this is all set up prior to discharge home.  SNF not an option given patient is not a [place]  citizen.   Subjective: Patient resting comfortably this morning, anticipating dialysis later this morning.  Translator tab used to convey results of MRI, details of patient's infection, duration of antibiotics, goals of care and plan going forward.  Patient was appreciative of the care he is receiving from doctors.  Currently admits his back pain is improved, worse when he lays on his left side versus his right.  Denies any fever, shortness of breath, chest pain, nausea, vomiting, abdominal pain.  Physical Exam:  Vitals:   12/18/23 1607 12/18/23 1927 12/19/23 0433 12/19/23 0755  BP: 128/68 114/77 100/73 114/70  Pulse: 68 75 76 77  Resp:  16 17   Temp: 98 F (36.7 C) 97.9 F (36.6 C) 97.9 F (36.6 C) 98.4 F (36.9 C)  TempSrc:  Oral Oral   SpO2: 97% 95% 97% 98%  Weight:      Height:        GENERAL:  Alert, pleasant, frail, ill-appearing, disheveled HEENT:  EOMI CARDIOVASCULAR:  RRR, no murmurs appreciated RESPIRATORY:  Clear to auscultation, no wheezing, rales, or rhonchi GASTROINTESTINAL:  Soft, nontender, nondistended EXTREMITIES: BL BKA NEURO:  No new focal deficits appreciated SKIN:  No rashes noted PSYCH:  Appropriate mood and affect    Data  Reviewed:  Imaging Studies: MR Lumbar Spine W Wo Contrast Result Date: 12/19/2023 EXAM: MRI LUMBAR SPINE 12/19/2023 09:16:00 AM TECHNIQUE: Multiplanar multisequence MRI of the lumbar spine was performed with and without the administration of intravenous contrast. COMPARISON: MRI lumbar  spine 10/10/2023 and MRI sacrum 12/15/2023. CLINICAL HISTORY: Low back pain, infection suspected. FINDINGS: BONES AND ALIGNMENT: Straightening of the lumbar lordosis. Irregularity of the L4 inferior endplate with height loss of the L4 vertebral body, new since the prior lumbar spine MRI. Vertebral body heights otherwise maintained. SPINAL CORD: The conus terminates normally. SOFT TISSUES: Edema within the bilateral psoas muscles from the level of L3-4 extending into the pelvis to at least the superior aspect of the sacrum. Additional small focus of peripheral enhancing fluid within the left psoas muscle, best seen on series 8 image 21. Remarkable enhancing soft tissue along the posterior aspect of the L4 and L5 vertebral bodies measuring up to 0.4 cm in thickness, concerning for epidural phlegmon. L1-L2: No significant disc herniation. No spinal canal stenosis or neural foraminal narrowing. L2-L3: No significant disc herniation. No spinal canal stenosis or neural foraminal narrowing. L3-L4: No significant disc herniation. No spinal canal stenosis or neural foraminal narrowing. L4-L5: Signal abnormality within the L4-5 disc with adjacent bone marrow signal abnormality and enhancement, suggestive of discitis/osteomyelitis. Trace soft tissue swelling along the anterior aspect of the L4-5 disc with a focal 0.6 x 0.8 x 0.8 cm peripheral enhancing collection, concerning for abscess, seen on series 9 image 27 and series 8 image 15. Additional irregular peripheral enhancing collection within the left psoas, compatible with abscess, measuring 1.3 x 1.3 x 2.5 cm. Prominent disc bulge at L4-5, slightly eccentric to the left, which indents the ventral thecal sac with lateral recess narrowing. Facet arthrosis at L4-5 with moderate spinal canal stenosis at this level. Disc bulge and facet arthrosis also contribute to moderate-to-severe right and moderate left foraminal stenosis at L4-5. Moderate bilateral facet effusions at L4-5  without significant surrounding enhancement. L5-S1: No significant disc herniation. No spinal canal stenosis or neural foraminal narrowing. IMPRESSION: 1. Discitis/osteomyelitis at L4-5 with associated paraspinal inflammatory changes, right psoas abscess, and epidural phlegmon. 2. Additional small paraspinal abscess anterior to the L4-5 disc. Possible small psoas abscess on the left. 3. Moderate spinal canal stenosis at L4-5 due to disc bulge and facet arthrosis, with moderate-to-severe right and moderate left foraminal stenosis. Electronically signed by: Donnice Mania MD 12/19/2023 11:41 AM EDT RP Workstation: [id number]   CT ABDOMEN PELVIS W CONTRAST Result Date: 12/16/2023 CLINICAL DATA:  Concern for right psoas abscess on recent sacral MRI. Right hip pain. EXAM: CT ABDOMEN AND PELVIS WITH CONTRAST TECHNIQUE: Multidetector CT imaging of the abdomen and pelvis was performed using the standard protocol following bolus administration of intravenous contrast. RADIATION DOSE REDUCTION: This exam was performed according to the departmental dose-optimization program which includes automated exposure control, adjustment of the mA and/or kV according to patient size and/or use of iterative reconstruction technique. CONTRAST:  75mL OMNIPAQUE  IOHEXOL  350 MG/ML SOLN COMPARISON:  Abdominopelvic CT 10/08/2023.  Sacral MRI 12/15/2023. FINDINGS: Technical note: Despite efforts by the technologist and patient, mild motion artifact is present on today's exam and could not be eliminated. This reduces exam sensitivity and specificity. Examination was performed with the patient in the right lateral decubitus position. Lower chest: Patchy scarring at both lung bases, similar to previous CT. Central line extends into the upper right atrium. There is coronary artery atherosclerosis. Hepatobiliary: The liver is normal in density without  suspicious focal abnormality. Diffuse intra and extrahepatic biliary dilatation is again noted,  similar to previous CT. The gallbladder is mildly distended without wall thickening or definite surrounding inflammation. Pancreas: Diffuse atrophy. No pancreatic ductal dilatation or surrounding inflammation identified. Spleen: Normal in size without focal abnormality. Adrenals/Urinary Tract: Both adrenal glands appear normal. Chronic renal cortical thinning and atrophy bilaterally with prominent renal vascular calcifications. No evidence of hydronephrosis or suspicious renal mass. The bladder appears unremarkable for its degree of distention. Stomach/Bowel: No enteric contrast administered. Mild generalized bowel wall thickening without significant distension or focal abnormality. Vascular/Lymphatic: There are no enlarged abdominal or pelvic lymph nodes. Severe aortic and branch vessel atherosclerosis without evidence of aneurysm or large vessel occlusion. Reproductive: The prostate gland and seminal vesicles appear unremarkable. Other: Mild generalized soft tissue edema consistent with anasarca. No ascites, focal extraluminal fluid collection or pneumoperitoneum. Musculoskeletal: As seen on recent pelvic MRI, there are findings of the L4-5 level suspicious for discitis and osteomyelitis. There are surrounding paraspinal inflammatory changes with heterogeneous enhancement in both psoas muscles. There is an asymmetric abscess on the right, measuring up to 3.9 cm in length on coronal image 52/6. Overall findings are similar to the recent MRI. The sacroiliac joints appear normal. Previous left femoral ORIF noted. IMPRESSION: 1. Findings of discitis and osteomyelitis at L4-5 with surrounding paraspinal inflammatory changes and right psoas abscess, similar to recent MRI. Again, this could be further evaluated with lumbar MRI. 2. No other acute findings identified in the abdomen or pelvis. 3. Chronic intra and extrahepatic biliary dilatation, similar to previous CT. 4. Chronic renal cortical thinning and atrophy  bilaterally. 5.  Aortic Atherosclerosis (ICD10-I70.0). Electronically Signed   By: Elsie Perone M.D.   On: 12/16/2023 13:04   MR SACRUM SI JOINTS W WO CONTRAST Result Date: 12/15/2023 CLINICAL DATA:  Septic arthritis suspected, hip, xray done Right hip pain. EXAM: MRI PELVIS WITHOUT AND WITH CONTRAST TECHNIQUE: Multiplanar multisequence MR imaging of the pelvis with attention to the sacroiliac joints was performed both before and after administration of intravenous contrast. CONTRAST:  5mL GADAVIST  GADOBUTROL  1 MMOL/ML IV SOLN COMPARISON:  Right hip radiographs 12/14/2023. MRI of the sacrum 10/15/2023 and MRI of the right hip 10/13/2023. FINDINGS: Bones/Joint/Cartilage No evidence of sacroiliitis, osteomyelitis or acute fracture within the visualized bony pelvis. However, there is new T2 hyperintensity and heterogeneous enhancement associated with the L4-5 disc and inferior endplate of L4, concerning for discitis and osteomyelitis. Paraspinal inflammatory changes are further described below. Mild degenerative changes at the hips and sacroiliac joints without significant joint effusions. Artifact from left femoral intramedullary nail noted. Ligaments No significant ligamentous abnormalities are identified. Muscles and Tendons Heterogeneous enhancement within the psoas muscles bilaterally with a peripherally enhancing fluid collection on the right measuring up to 2.2 x 1.2 x 4.1 cm, consistent with an abscess. No significant pelvic muscular abnormalities are identified. Soft tissue As above, findings suspicious for discitis and osteomyelitis at L4-5. There is heterogeneous paraspinal enhancement with extension into the psoas musculature as described above. No abscess or other focal fluid collection identified within the pelvis. IMPRESSION: 1. Findings are consistent with discitis and osteomyelitis at L4-5 with associated paraspinal inflammatory changes and right psoas abscess. Recommend dedicated lumbar MRI  without and with contrast. 2. No evidence of sacroiliitis, osteomyelitis or septic arthritis within the pelvis. 3. Mild degenerative changes at the hips and sacroiliac joints. Electronically Signed   By: Elsie Perone M.D.   On: 12/15/2023 10:36   DG Hip Unilat  W or Wo Pelvis 2-3 Views Right Result Date: 12/14/2023 CLINICAL DATA:  Right hip pain. EXAM: DG HIP (WITH OR WITHOUT PELVIS) 2-3V RIGHT COMPARISON:  None Available. FINDINGS: There is no evidence of acute hip fracture or dislocation. A radiopaque intramedullary rod and associated fixation screw are seen within the proximal left femoral shaft. There is no evidence of arthropathy or other focal bone abnormality. Marked severity vascular calcification is noted. IMPRESSION: 1. No acute findings. 2. Prior ORIF of the left femur. Electronically Signed   By: Suzen Dials M.D.   On: 12/14/2023 23:29    As above  Previous records (including but not limited to H&P, progress notes, nursing notes, TOC management) were reviewed in assessment of this patient.  Labs: CBC: Recent Labs  Lab 12/14/23 2332 12/17/23 0840 12/18/23 0635 12/19/23 0450  WBC 10.1 9.1 10.9* 8.1  NEUTROABS 7.7  --   --   --   HGB 10.0* 9.0* 9.4* 9.7*  HCT 30.9* 28.9* 29.7* 30.8*  MCV 99.7 101.0* 102.1* 100.3*  PLT 249 249 261 257   Basic Metabolic Panel: Recent Labs  Lab 12/14/23 2332 12/15/23 0608 12/17/23 0840 12/18/23 0635 12/19/23 0450  NA 131* 134* 132* 133* 136  K 3.7 3.4* 4.1 4.3 4.7  CL 94* 93* 93* 97* 99  CO2 23 23 22 23  19*  GLUCOSE 85 60* 161* 162* 142*  BUN 37* 39* 29* 19 30*  CREATININE 8.38* 9.06* 6.75* 5.01* 6.48*  CALCIUM  9.0 9.2 8.8* 9.0 9.1  MG  --   --   --  2.0 2.2  PHOS  --   --  7.3*  --  5.6*   Liver Function Tests: Recent Labs  Lab 12/15/23 0608 12/17/23 0840  AST 19  --   ALT 9  --   ALKPHOS 74  --   BILITOT 1.8*  --   PROT 10.3*  --   ALBUMIN  2.7* 2.3*   CBG: Recent Labs  Lab 12/18/23 1151 12/18/23 1608  12/18/23 1929 12/19/23 0754 12/19/23 1140  GLUCAP 78 110* 147* 136* 142*    Scheduled Meds:  acetaminophen   1,000 mg Oral TID   bictegravir-emtricitabine -tenofovir  AF  1 tablet Oral Daily   Chlorhexidine  Gluconate Cloth  6 each Topical Q0600   Chlorhexidine  Gluconate Cloth  6 each Topical Q0600   gabapentin   100 mg Oral TID   heparin   5,000 Units Subcutaneous Q8H   heparin  sodium (porcine)  4,000 Units Intracatheter Once   insulin  aspart  0-5 Units Subcutaneous QHS   insulin  aspart  0-6 Units Subcutaneous TID WC   insulin  aspart  2 Units Subcutaneous TID WC   methocarbamol   500 mg Oral TID   sevelamer  carbonate  800 mg Oral TID WC   sodium chloride  flush  10-40 mL Intracatheter Q12H   Continuous Infusions:  anticoagulant sodium citrate      ceFEPime  (MAXIPIME ) IV 1 g (12/18/23 2102)   vancomycin  Stopped (12/17/23 1456)   PRN Meds:.alteplase , anticoagulant sodium citrate , heparin , heparin , lidocaine  (PF), lidocaine -prilocaine , midodrine , oxyCODONE , pentafluoroprop-tetrafluoroeth, sodium chloride  flush  Family Communication: none at bedside  Disposition: Status is: Inpatient Remains inpatient appropriate because: diskitis and abscess     Time spent: 53 minutes  Length of inpatient stay: 4 days  Author: Carliss LELON Canales, DO 12/19/2023 12:28 PM  For on call review [email or web address].

## 2023-12-19 NOTE — Progress Notes (Addendum)
 Received patient in bed to unit.  Alert and oriented.  Informed consent signed and in chart.   TX duration:3.5 hours  Patient tolerated well.  Transported back to the room  Alert, without acute distress.  Hand-off given to patient's nurse.   Access used: R internal jugular HD Cath Access issues: none  Total UF removed: 1L Medication(s) given: Tylenol , gabapentin , robaxin , Vancomycin , Albumin    12/19/23 1730  Vitals  Temp 98 F (36.7 C)  Temp Source Oral  BP 92/73  MAP (mmHg) 79  Pulse Rate 76  ECG Heart Rate 77  Resp 11  Oxygen Therapy  SpO2 99 %  O2 Device Room Air  During Treatment Monitoring  Duration of HD Treatment -hour(s) 3.5 hour(s)  HD Safety Checks Performed Yes  Intra-Hemodialysis Comments Tx completed  Post Treatment  Dialyzer Clearance Clear  Liters Processed 83.9  Fluid Removed (mL) 1000 mL  Tolerated HD Treatment Yes  Post-Hemodialysis Comments Albumin  and midodrine  given as ordered for BP support  Hemodialysis Catheter Right Internal jugular Double lumen Permanent (Tunneled)  Placement Date/Time: 11/11/23 1428   Serial / Lot #: [id number]  Expiration Date: 04/14/28  Time Out: Correct patient;Correct site;Correct procedure  Maximum sterile barrier precautions: Hand hygiene;Cap;Mask;Sterile gown;Sterile gloves;Large sterile ...  Site Condition No complications  Blue Lumen Status Flushed;Antimicrobial dead end cap;Heparin  locked  Red Lumen Status Flushed;Antimicrobial dead end cap;Heparin  locked  Purple Lumen Status N/A  Catheter fill solution Heparin  1000 units/ml  Catheter fill volume (Arterial) 1.6 cc  Catheter fill volume (Venous) 1.6  Dressing Type Transparent  Dressing Status Antimicrobial disc/dressing in place;Clean, Dry, Intact  Drainage Description None  Dressing Change Due 12/22/23  Post treatment catheter status Capped and Clamped     Camellia Brasil LPN [facility]

## 2023-12-19 NOTE — Progress Notes (Addendum)
 Subjective:  He is still having significant pain in both of his legs that I expect is due to his spinal pathology and his sciatica and he says is not relieved by Tylenol  he says he does not think he will be able to walk due to the pain on his bilateral prostheses   Antibiotics:  Anti-infectives (From admission, onward)    Start     Dose/Rate Route Frequency Ordered Stop   12/19/23 0000  ceFEPime  (MAXIPIME ) IVPB        2 g Intravenous Every M-W-F (Hemodialysis) 12/19/23 1300 02/09/24 2359   12/19/23 0000  vancomycin  IVPB        500 mg Intravenous Every M-W-F (Hemodialysis) 12/19/23 1300 02/09/24 2359   12/16/23 2200  ceFEPIme  (MAXIPIME ) 1 g in sodium chloride  0.9 % 100 mL IVPB        1 g 200 mL/hr over 30 Minutes Intravenous Every 24 hours 12/15/23 1348     12/15/23 2230  vancomycin  (VANCOREADY) IVPB 500 mg/100 mL        500 mg 100 mL/hr over 60 Minutes Intravenous Every M-W-F (Hemodialysis) 12/15/23 2217     12/15/23 1400  bictegravir-emtricitabine -tenofovir  AF (BIKTARVY ) 50-200-25 MG per tablet 1 tablet        1 tablet Oral Daily 12/15/23 1307     12/15/23 1347  vancomycin  variable dose per unstable renal function (pharmacist dosing)  Status:  Discontinued         Does not apply See admin instructions 12/15/23 1348 12/15/23 2218   12/15/23 1100  ceFEPIme  (MAXIPIME ) 2 g in sodium chloride  0.9 % 100 mL IVPB        2 g 200 mL/hr over 30 Minutes Intravenous  Once 12/15/23 1048 12/15/23 1130   12/15/23 1100  vancomycin  (VANCOCIN ) IVPB 1000 mg/200 mL premix        1,000 mg 200 mL/hr over 60 Minutes Intravenous  Once 12/15/23 1048 12/15/23 1154       Medications: Scheduled Meds:  acetaminophen   1,000 mg Oral TID   bictegravir-emtricitabine -tenofovir  AF  1 tablet Oral Daily   Chlorhexidine  Gluconate Cloth  6 each Topical Q0600   Chlorhexidine  Gluconate Cloth  6 each Topical Q0600   gabapentin   100 mg Oral TID   heparin   5,000 Units Subcutaneous Q8H   heparin  sodium  (porcine)  3,200 Units Intracatheter Once   heparin  sodium (porcine)  4,000 Units Intracatheter Once   insulin  aspart  0-5 Units Subcutaneous QHS   insulin  aspart  0-6 Units Subcutaneous TID WC   insulin  aspart  2 Units Subcutaneous TID WC   lidocaine   1 patch Transdermal Q24H   methocarbamol   500 mg Oral TID   sevelamer  carbonate  800 mg Oral TID WC   sodium chloride  flush  10-40 mL Intracatheter Q12H   Continuous Infusions:  anticoagulant sodium citrate      ceFEPime  (MAXIPIME ) IV 1 g (12/18/23 2102)   vancomycin  500 mg (12/19/23 1622)   PRN Meds:.alteplase , anticoagulant sodium citrate , heparin , heparin , lidocaine  (PF), lidocaine -prilocaine , midodrine , oxyCODONE , pentafluoroprop-tetrafluoroeth, sodium chloride  flush    Objective: Weight change:   Intake/Output Summary (Last 24 hours) at 12/19/2023 1631 Last data filed at 12/18/2023 2100 Gross per 24 hour  Intake 240 ml  Output --  Net 240 ml   Blood pressure 113/72, pulse 77, temperature 98.1 F (36.7 C), resp. rate 12, height 4' 11 (1.499 m), weight 43.6 kg, SpO2 99%. Temp:  [97.9 F (36.6 C)-98.4 F (36.9 C)] 98.1  F (36.7 C) (08/08 1343) Pulse Rate:  [70-77] 77 (08/08 1600) Resp:  [10-19] 12 (08/08 1600) BP: (85-124)/(59-77) 113/72 (08/08 1600) SpO2:  [95 %-100 %] 99 % (08/08 1600) Weight:  [43.6 kg] 43.6 kg (08/08 1344)  Physical Exam: Physical Exam Constitutional:      Appearance: He is well-developed.  HENT:     Head: Normocephalic and atraumatic.  Eyes:     Conjunctiva/sclera: Conjunctivae normal.  Cardiovascular:     Rate and Rhythm: Normal rate and regular rhythm.  Pulmonary:     Effort: Pulmonary effort is normal. No respiratory distress.     Breath sounds: No wheezing.  Abdominal:     General: There is no distension.     Palpations: Abdomen is soft.  Musculoskeletal:     Cervical back: Normal range of motion and neck supple.  Skin:    General: Skin is warm and dry.  Neurological:     General: No  focal deficit present.     Mental Status: He is alert and oriented to person, place, and time.  Psychiatric:        Mood and Affect: Mood normal.        Behavior: Behavior normal.        Thought Content: Thought content normal.        Judgment: Judgment normal.      CBC:    BMET Recent Labs    12/18/23 0635 12/19/23 0450  NA 133* 136  K 4.3 4.7  CL 97* 99  CO2 23 19*  GLUCOSE 162* 142*  BUN 19 30*  CREATININE 5.01* 6.48*  CALCIUM  9.0 9.1     Liver Panel  Recent Labs    12/17/23 0840  ALBUMIN  2.3*       Sedimentation Rate Recent Labs    12/18/23 0635  ESRSEDRATE 122*   C-Reactive Protein Recent Labs    12/18/23 0635  CRP 18.7*    Micro Results: Recent Results (from the past 720 hours)  Blood Culture (routine x 2)     Status: None (Preliminary result)   Collection Time: 12/15/23  6:08 AM   Specimen: BLOOD RIGHT ARM  Result Value Ref Range Status   Specimen Description BLOOD RIGHT ARM  Final   Special Requests   Final    BOTTLES DRAWN AEROBIC AND ANAEROBIC Blood Culture adequate volume   Culture   Final    NO GROWTH 4 DAYS Performed at [facility], [address] [place], [place] [address]    Report Status PENDING  Incomplete  Blood Culture (routine x 2)     Status: None (Preliminary result)   Collection Time: 12/15/23  6:13 AM   Specimen: BLOOD RIGHT ARM  Result Value Ref Range Status   Specimen Description BLOOD RIGHT ARM  Final   Special Requests   Final    BOTTLES DRAWN AEROBIC AND ANAEROBIC Blood Culture adequate volume   Culture   Final    NO GROWTH 4 DAYS Performed at [facility], [address], [place], [place] [address]    Report Status PENDING  Incomplete    Studies/Results: MR Lumbar Spine W Wo Contrast Result Date: 12/19/2023 EXAM: MRI LUMBAR SPINE 12/19/2023 09:16:00 AM TECHNIQUE: Multiplanar multisequence MRI of the lumbar spine was performed with and without the administration of intravenous contrast.  COMPARISON: MRI lumbar spine 10/10/2023 and MRI sacrum 12/15/2023. CLINICAL HISTORY: Low back pain, infection suspected. FINDINGS: BONES AND ALIGNMENT: Straightening of the lumbar lordosis. Irregularity of the  L4 inferior endplate with height loss of the L4 vertebral body, new since the prior lumbar spine MRI. Vertebral body heights otherwise maintained. SPINAL CORD: The conus terminates normally. SOFT TISSUES: Edema within the bilateral psoas muscles from the level of L3-4 extending into the pelvis to at least the superior aspect of the sacrum. Additional small focus of peripheral enhancing fluid within the left psoas muscle, best seen on series 8 image 21. Remarkable enhancing soft tissue along the posterior aspect of the L4 and L5 vertebral bodies measuring up to 0.4 cm in thickness, concerning for epidural phlegmon. L1-L2: No significant disc herniation. No spinal canal stenosis or neural foraminal narrowing. L2-L3: No significant disc herniation. No spinal canal stenosis or neural foraminal narrowing. L3-L4: No significant disc herniation. No spinal canal stenosis or neural foraminal narrowing. L4-L5: Signal abnormality within the L4-5 disc with adjacent bone marrow signal abnormality and enhancement, suggestive of discitis/osteomyelitis. Trace soft tissue swelling along the anterior aspect of the L4-5 disc with a focal 0.6 x 0.8 x 0.8 cm peripheral enhancing collection, concerning for abscess, seen on series 9 image 27 and series 8 image 15. Additional irregular peripheral enhancing collection within the left psoas, compatible with abscess, measuring 1.3 x 1.3 x 2.5 cm. Prominent disc bulge at L4-5, slightly eccentric to the left, which indents the ventral thecal sac with lateral recess narrowing. Facet arthrosis at L4-5 with moderate spinal canal stenosis at this level. Disc bulge and facet arthrosis also contribute to moderate-to-severe right and moderate left foraminal stenosis at L4-5. Moderate bilateral  facet effusions at L4-5 without significant surrounding enhancement. L5-S1: No significant disc herniation. No spinal canal stenosis or neural foraminal narrowing. IMPRESSION: 1. Discitis/osteomyelitis at L4-5 with associated paraspinal inflammatory changes, right psoas abscess, and epidural phlegmon. 2. Additional small paraspinal abscess anterior to the L4-5 disc. Possible small psoas abscess on the left. 3. Moderate spinal canal stenosis at L4-5 due to disc bulge and facet arthrosis, with moderate-to-severe right and moderate left foraminal stenosis. 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 male who I managed in the outpatient setting for his HIV disease that is perfectly controlled he has comorbid diabetes mellitus end-stage renal disease on hemodialysis and he was undergone bilateral below the knee amputations and has had recent MRSA endocarditis who has been admitted with L4-L5 discitis osteomyelitis and right psoas abscess.  MRI lumbar spine performed today and shows discitis osteomyelitis at L4-L5 with paraspinal inflammatory changes the right psoas abscess as well as an epidural phlegmon that was not seen on prior imaging there is also small paraspinal abscess anterior to the L4-L5 disc and a small possible psoas abscess in the left with moderate spinal canal stenosis at L4-L5 due to disc bulge and facet  arthrosis.   #1 Lumbar disktis tibial osteomyelitis with psoas abscess paraspinal abscess and epidural phlegmon:  I would suspect the culprit organism is the MRSA that  was in his blood most recently but we do not have a positive blood culture on this admission and IR did not have a window to get a biopsy for culture.  Therefore we will proceed with 8 weeks of vancomycin  and cefepime  that can be given on dialysis days at his dialysis center.  His pain needs to be better controlled and he needs to be able to safely ambulate on his prostheses prior to discharge.  I have arranged for hospital follow-up in my clinic as below  Note given the epidural phlegmon and the psoas abscesses and paraspinal abscesses he does need repeat imaging prior to his stopping antibiotics and we can arrange that when he is in the outpatient world.   #2 HIV disease: Continue Biktarvy  and I will check his labs in my clinic  #3IP: He should remain on contact precautions for MRSA  Azir Wieseler has an appointment on 01/27/24 at 1030AM with Dr.Van Dam at  [facility], which  is located in the [facility] at  [address] in [place].  [address], which is located to the left of the elevators.  Phone: [phone]  Fax: [phone]  [email or web address]  The patient should arrive 30 minutes prior to their appoitment.  I have personally spent 50 minutes involved in face-to-face and non-face-to-face activities for this patient on the day of the visit. Professional time spent includes the following activities: Preparing to see the patient (review of tests), Obtaining and/or reviewing separately obtained history (admission/discharge record), Performing a medically appropriate examination and/or evaluation , Ordering medications/tests/procedures, referring and communicating with other health care professionals, Documenting  clinical information in the EMR, Independently interpreting results (not separately reported), Communicating results to the patient/family/caregiver, Counseling and educating the patient/family/caregiver and Care coordination (not separately reported).   Evaluation of the patient requires complex antimicrobial therapy evaluation, counseling , isolation needs to reduce disease transmission and risk assessment and mitigation.   I will sign off for now please call with further questions.   LOS: 4 days   [name] 12/19/2023, 4:31 PM

## 2023-12-19 NOTE — Progress Notes (Addendum)
 [facility] Progress Note   Subjective:    Seen and examined patient on HD. Informed on low Bps but responded well to Midodrine  and IV Albumin . Now tolerating UFG 1L. BP now is 118/72. He endorses pain in both legs. Discussed with ID earlier. Patient will be on ABXs with HD at discharge.  Objective Vitals:   12/19/23 1500 12/19/23 1530 12/19/23 1600 12/19/23 1630  BP: (!) 85/59 124/70 113/72 118/72  Pulse: 70 75 77 81  Resp: 11 13 12 10   Temp:      TempSrc:      SpO2: 99% 100% 99% 98%  Weight:      Height:       Physical Exam General: Awake, alert, NAD Heart: RRR; No murmurs Lungs: Clear anteriorly Abdomen: Soft and non-tender Extremities: No LE edema Dialysis Access: [facility]   Filed Weights   12/17/23 0820 12/19/23 1343 12/19/23 1344  Weight: 40.3 kg 43.6 kg 43.6 kg    Intake/Output Summary (Last 24 hours) at 12/19/2023 1649 Last data filed at 12/18/2023 2100 Gross per 24 hour  Intake 240 ml  Output --  Net 240 ml    Additional Objective Labs: Basic Metabolic Panel: Recent Labs  Lab 12/17/23 0840 12/18/23 0635 12/19/23 0450  NA 132* 133* 136  K 4.1 4.3 4.7  CL 93* 97* 99  CO2 22 23 19*  GLUCOSE 161* 162* 142*  BUN 29* 19 30*  CREATININE 6.75* 5.01* 6.48*  CALCIUM  8.8* 9.0 9.1  PHOS 7.3*  --  5.6*   Liver Function Tests: Recent Labs  Lab 12/15/23 0608 12/17/23 0840  AST 19  --   ALT 9  --   ALKPHOS 74  --   BILITOT 1.8*  --   PROT 10.3*  --   ALBUMIN  2.7* 2.3*   No results for input(s): LIPASE, AMYLASE in the last 168 hours. CBC: Recent Labs  Lab 12/14/23 2332 12/17/23 0840 12/18/23 0635 12/19/23 0450  WBC 10.1 9.1 10.9* 8.1  NEUTROABS 7.7  --   --   --   HGB 10.0* 9.0* 9.4* 9.7*  HCT 30.9* 28.9* 29.7* 30.8*  MCV 99.7 101.0* 102.1* 100.3*  PLT 249 249 261 257   Blood Culture    Component Value Date/Time   SDES BLOOD RIGHT ARM 12/15/2023 0613   SPECREQUEST  12/15/2023 0613    BOTTLES DRAWN AEROBIC AND ANAEROBIC  Blood Culture adequate volume   CULT  12/15/2023 0613    NO GROWTH 4 DAYS Performed at [facility], [address] [place], [place] [address]    REPTSTATUS PENDING 12/15/2023 [phone]    Cardiac Enzymes: No results for input(s): CKTOTAL, CKMB, CKMBINDEX, TROPONINI in the last 168 hours. CBG: Recent Labs  Lab 12/18/23 1151 12/18/23 1608 12/18/23 1929 12/19/23 0754 12/19/23 1140  GLUCAP 78 110* 147* 136* 142*   Iron  Studies: No results for input(s): IRON , TIBC, TRANSFERRIN, FERRITIN in the last 72 hours. Lab Results  Component Value Date   INR 1.1 12/15/2023   INR 1.2 10/08/2023   INR 1.2 02/28/2020   Studies/Results: MR Lumbar Spine W Wo Contrast Result Date: 12/19/2023 EXAM: MRI LUMBAR SPINE 12/19/2023 09:16:00 AM TECHNIQUE: Multiplanar multisequence MRI of the lumbar spine was performed with and without the administration of intravenous contrast. COMPARISON: MRI lumbar spine 10/10/2023 and MRI sacrum 12/15/2023. CLINICAL HISTORY: Low back pain, infection suspected. FINDINGS: BONES AND ALIGNMENT: Straightening of the lumbar lordosis. Irregularity of the L4 inferior endplate with height loss of the L4  vertebral body, new since the prior lumbar spine MRI. Vertebral body heights otherwise maintained. SPINAL CORD: The conus terminates normally. SOFT TISSUES: Edema within the bilateral psoas muscles from the level of L3-4 extending into the pelvis to at least the superior aspect of the sacrum. Additional small focus of peripheral enhancing fluid within the left psoas muscle, best seen on series 8 image 21. Remarkable enhancing soft tissue along the posterior aspect of the L4 and L5 vertebral bodies measuring up to 0.4 cm in thickness, concerning for epidural phlegmon. L1-L2: No significant disc herniation. No spinal canal stenosis or neural foraminal narrowing. L2-L3: No significant disc herniation. No spinal canal stenosis or neural foraminal narrowing. L3-L4: No  significant disc herniation. No spinal canal stenosis or neural foraminal narrowing. L4-L5: Signal abnormality within the L4-5 disc with adjacent bone marrow signal abnormality and enhancement, suggestive of discitis/osteomyelitis. Trace soft tissue swelling along the anterior aspect of the L4-5 disc with a focal 0.6 x 0.8 x 0.8 cm peripheral enhancing collection, concerning for abscess, seen on series 9 image 27 and series 8 image 15. Additional irregular peripheral enhancing collection within the left psoas, compatible with abscess, measuring 1.3 x 1.3 x 2.5 cm. Prominent disc bulge at L4-5, slightly eccentric to the left, which indents the ventral thecal sac with lateral recess narrowing. Facet arthrosis at L4-5 with moderate spinal canal stenosis at this level. Disc bulge and facet arthrosis also contribute to moderate-to-severe right and moderate left foraminal stenosis at L4-5. Moderate bilateral facet effusions at L4-5 without significant surrounding enhancement. L5-S1: No significant disc herniation. No spinal canal stenosis or neural foraminal narrowing. IMPRESSION: 1. Discitis/osteomyelitis at L4-5 with associated paraspinal inflammatory changes, right psoas abscess, and epidural phlegmon. 2. Additional small paraspinal abscess anterior to the L4-5 disc. Possible small psoas abscess on the left. 3. Moderate spinal canal stenosis at L4-5 due to disc bulge and facet arthrosis, with moderate-to-severe right and moderate left foraminal stenosis. Electronically signed by: Donnice Mania MD 12/19/2023 11:41 AM EDT RP Workstation: [id number]    Medications:  anticoagulant sodium citrate      ceFEPime  (MAXIPIME ) IV 1 g (12/18/23 2102)   vancomycin  500 mg (12/19/23 1622)    acetaminophen   1,000 mg Oral TID   bictegravir-emtricitabine -tenofovir  AF  1 tablet Oral Daily   Chlorhexidine  Gluconate Cloth  6 each Topical Q0600   Chlorhexidine  Gluconate Cloth  6 each Topical Q0600   gabapentin   100 mg Oral TID    heparin   5,000 Units Subcutaneous Q8H   heparin  sodium (porcine)  3,200 Units Intracatheter Once   heparin  sodium (porcine)  4,000 Units Intracatheter Once   insulin  aspart  0-5 Units Subcutaneous QHS   insulin  aspart  0-6 Units Subcutaneous TID WC   insulin  aspart  2 Units Subcutaneous TID WC   lidocaine   1 patch Transdermal Q24H   methocarbamol   500 mg Oral TID   sevelamer  carbonate  800 mg Oral TID WC   sodium chloride  flush  10-40 mL Intracatheter Q12H    Dialysis Orders: [facility]-MWF 3.5h  B400  45.5kg  TDC  Heparin  2000 Mircera 100 mcg - last dose 12/01/23 Calcitriol  1.0 mcg (on hold due to high Ca)  Home bp meds: Midodrine  10mg  tid prn for SBP < 100 Midodrine  10mg  pre HD mwf prn  Assessment/Plan: 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 Blood cx's sent, IV vanc/ cefepime  started. Discussed with ID and pharmacy: plan for IV Cefepime  2GM and Vancomycin  500mg  with HD X 8 weeks end  date 02/09/24.  ESRD: on HD MWF. Had HD overnight and 600 mL Uf'd. On HD. BP: chronic low BP on midodrine  10 tid prn and 10mg  pre HD mwf as needed for SBP < 100. Can give albumin  during HD for low BP.  Volume: not a big volume gainer, limits wt gains. 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 HIV: controlled on Biktarvy  BMD: Corrected Ca 10.4 and phos high. Did not see phos binder on his OP med rec. Start sevelamer  TID WC. VDRA on hold due to high Ca.  Charmaine Piety, NP [facility] 12/19/2023,4:49 PM  LOS: 4 days

## 2023-12-19 NOTE — Plan of Care (Signed)

## 2023-12-20 DIAGNOSIS — G062 Extradural and subdural abscess, unspecified: Secondary | ICD-10-CM

## 2023-12-20 DIAGNOSIS — M4620 Osteomyelitis of vertebra, site unspecified: Secondary | ICD-10-CM

## 2023-12-20 LAB — CULTURE, BLOOD (ROUTINE X 2)
Culture: NO GROWTH
Culture: NO GROWTH
Special Requests: ADEQUATE
Special Requests: ADEQUATE

## 2023-12-20 LAB — GLUCOSE, CAPILLARY
Glucose-Capillary: 95 mg/dL (ref 70–99)
Glucose-Capillary: 102 mg/dL — ABNORMAL HIGH (ref 70–99)
Glucose-Capillary: 113 mg/dL — ABNORMAL HIGH (ref 70–99)
Glucose-Capillary: 175 mg/dL — ABNORMAL HIGH (ref 70–99)

## 2023-12-20 MED ORDER — OXYCODONE HCL 5 MG PO TABS
10.0000 mg | ORAL_TABLET | Freq: Four times a day (QID) | ORAL | Status: DC | PRN
  Administered 2023-12-20 – 2023-12-22 (×6): 10 mg via ORAL
  Filled 2023-12-20 (×6): qty 2

## 2023-12-20 MED ORDER — KETOROLAC TROMETHAMINE 15 MG/ML IJ SOLN
15.0000 mg | Freq: Once | INTRAMUSCULAR | Status: AC
  Administered 2023-12-20: 15 mg via INTRAVENOUS
  Filled 2023-12-20: qty 1

## 2023-12-20 NOTE — Evaluation (Signed)
 Physical Therapy Evaluation Patient Details Name: Alvin Daniels MRN: [id number] DOB: 12-01-1970 Today's Date: 12/20/2023  History of Present Illness  Patient is a 53 y/o male admitted 12/14/23 with hip pain MRI showing R psoas abscess and L4-L5 discitis and osteomyelitis. 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  PMH includes ESRD on HD MWF, hepC, MRSA, HIV, bilat BKA, DMII.  Clinical Impression  Patient presents with decreased mobility due to pain, decreased activity tolerance and generalized weakness.  Previously patient able to manage on his own in his ground floor apartment with friend assist for groceries, transportation, walking with prosthetic legs with RW and has w/c as well.  Currently too painful to attempt to don prostheses to attempt standing (despite attempts to pre-medicate), however, able to move up and sit on EOB on his own.  Feel he will continue to benefit from skilled PT in the acute setting and hopefully not need follow up PT at d/c)      If plan is discharge home, recommend the following: A little help with walking and/or transfers;Help with stairs or ramp for entrance;Assist for transportation   Can travel by private vehicle        Equipment Recommendations None recommended by PT  Recommendations for Other Services       Functional Status Assessment Patient has had a recent decline in their functional status and demonstrates the ability to make significant improvements in function in a reasonable and predictable amount of time.     Precautions / Restrictions Precautions Precautions: Fall Precaution/Restrictions Comments: B BKA; prosthetics in the room      Mobility  Bed Mobility Overal bed mobility: Modified Independent             General bed mobility comments: up to sit on EOB though rail in front unaided and returned to supine on his own    Transfers                    General transfer comment: deferred due to pain    Ambulation/Gait                  Stairs            Wheelchair Mobility     Tilt Bed    Modified Rankin (Stroke Patients Only)       Balance Overall balance assessment: Needs assistance   Sitting balance-Leahy Scale: Good         Standing balance comment: NT today                             Pertinent Vitals/Pain Pain Assessment Pain Assessment: Faces Faces Pain Scale: Hurts even more Pain Location: back (at waist) with mobility Pain Descriptors / Indicators: Grimacing, Guarding, Aching Pain Intervention(s): Monitored during session, Repositioned, Limited activity within patient's tolerance, Patient requesting pain meds-RN notified    Home Living Family/patient expects to be discharged to:: Private residence Living Arrangements: Alone Available Help at Discharge: Friend(s);Available PRN/intermittently Type of Home: Apartment Home Access: Level entry       Home Layout: One level Home Equipment: Agricultural consultant (2 wheels);Shower seat;Wheelchair - manual      Prior Function Prior Level of Function : Independent/Modified Independent                     Extremity/Trunk Assessment   Upper Extremity Assessment Upper Extremity Assessment: Overall [facility]  for tasks assessed (but missing fingers)    Lower Extremity Assessment Lower Extremity Assessment: RLE deficits/detail;LLE deficits/detail RLE Deficits / Details: B BKA, AROM WFL, strength at least 4/5, denies numbness LLE Deficits / Details: B BKA, AROM WFL, strength at least 4/5, denies numbness    Cervical / Trunk Assessment Cervical / Trunk Assessment: Normal  Communication   Communication Communication: No apparent difficulties Factors Affecting Communication: Non - English speaking, interpreter not available (ipad interpreter Jesus)    Cognition Arousal: Alert Behavior During Therapy: WFL for tasks  assessed/performed   PT - Cognitive impairments: No apparent impairments                         Following commands: Intact       Cueing       General Comments General comments (skin integrity, edema, etc.): RN asked to give meds and PT returned later to give time for meds to work. RN had reported giving meds though pt denied and noted medicine cup on computer in the room.  RN alerted and gave meds, though pt stated could not tolerated donning his legs to stand due to pain today, encouraged PT would try again next session to have him pre-medicated.    Exercises     Assessment/Plan    PT Assessment Patient needs continued PT services  PT Problem List Decreased activity tolerance;Decreased mobility;Pain;Decreased safety awareness       PT Treatment Interventions DME instruction;Gait training;Patient/family education;Functional mobility training;Therapeutic activities;Therapeutic exercise;Balance training    PT Goals (Current goals can be found in the Care Plan section)  Acute Rehab PT Goals Patient Stated Goal: to improve pain to walk PT Goal Formulation: With patient Time For Goal Achievement: 01/03/24 Potential to Achieve Goals: Good    Frequency Min 2X/week     Co-evaluation               AM-PAC PT 6 Clicks Mobility  Outcome Measure Help needed turning from your back to your side while in a flat bed without using bedrails?: None   Help needed moving to and from a bed to a chair (including a wheelchair)?: Total Help needed standing up from a chair using your arms (e.g., wheelchair or bedside chair)?: Total Help needed to walk in hospital room?: Total Help needed climbing 3-5 steps with a railing? : Total 6 Click Score: 8    End of Session   Activity Tolerance: Patient limited by pain Patient left: in bed;with call bell/phone within reach   PT Visit Diagnosis: Pain;Other abnormalities of gait and mobility (R26.89) Pain - part of body:  (back)     Time: 8379-8350 PT Time Calculation (min) (ACUTE ONLY): 29 min   Charges:   PT Evaluation $PT Eval Moderate Complexity: 1 Mod   PT General Charges $$ ACUTE PT VISIT: 1 Visit         Micheline Portal, PT Acute Rehabilitation Services Office:[phone] 12/20/2023   Montie Portal 12/20/2023, 5:30 PM

## 2023-12-20 NOTE — Plan of Care (Signed)

## 2023-12-20 NOTE — Progress Notes (Signed)
 Progress Note   Patient: Alvin Daniels FMW:[id number] DOB: 05-Sep-1970 DOA: 12/14/2023  DOS: the patient was seen and examined on 12/20/2023   Brief hospital course:  53 y.o. 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 coli and Streptococcus anginosus who presents with hip pain MRI was done that showed a right psoas abscess and L4-L5 discitis and osteomyelitis    Assessment and Plan:   L4-L5 osteomyelitis/discitis with right psoas abscess - Coverage empirically with cefepime  plus vancomycin .  MRI sacrum noting a 2.2 x 1 x 4.1 cm right psoas abscess.  Repeat MRI this morning noting discitis/osteomyelitis at L4-L5, right psoas abscess, and epidural phlegmon.  Also noting small paraspinal abscess anterior to L4-L5 as well as possible small left psoas abscess.  IR consulted for possible drainage however no safe access appreciated.  Not a surgical candidate.  Infectious disease consulted recommending conservative management with IV cefepime  plus vancomycin .  Patient received cefepime  plus vancomycin  with HD for total of 8 weeks, end date 02/09/2024.   ESRD on HD MWF - Nephrology following closely.  Tolerated dialysis well.  Next HD 8/11.   Chronic hypotension - Likely exacerbated by infection and poor p.o. intake.  Midodrine  on board.  Nephrology following closely during HD.   Uncontrolled diabetes mellitus - Previous A1c 10 suggesting very poor control.  Currently on insulin  sliding scale.   HIV/chronic hep C - ID following.  Continue Biktarvy .   Severe protein calorie malnutrition - Protein supplementation encouraged.   Bilateral BKA - Contributing to physical debilitation and muscle weakness.  BL  prosthetics in room.  Patient complaining of pain with ambulation.  Will order PT eval to assess.  Would need to ensure patient is able to be ambulatory prior to discharge.   Goals of care - Disposition planning with patient, TOC, infectious disease, nephrology.  Infectious disease consulted recommending conservative management with IV cefepime  plus vancomycin .  Patient received cefepime  plus vancomycin  with HD for total of 8 weeks, end date 02/09/2024.  Repeat MRI recommended by infectious disease as well, to be scheduled in the outpatient setting.  Working closely with TOC to ensure this is all set up prior to discharge home.  SNF not an option given patient is not a [place]  citizen.   Subjective: Patient resting comfortably this morning.  Still complains of some pain when attempting to ambulate.  Denies any fever, nausea, vomiting.  Physical Exam:  Vitals:   12/19/23 1932 12/20/23 0011 12/20/23 0357 12/20/23 0800  BP: (!) 139/98 116/72 118/72 100/77  Pulse: 88 83 72 82  Resp: 16 16 16 15   Temp: 97.6 F (36.4 C) 98.3 F (36.8 C) 98.1 F (36.7 C) 98.4 F (36.9 C)  TempSrc: Oral Oral Oral Oral  SpO2: 98% 92% 100% 96%  Weight:      Height:        GENERAL:  Alert, pleasant, frail, cachectic, disheveled HEENT:  EOMI CARDIOVASCULAR:  RRR, no murmurs appreciated RESPIRATORY:  Clear to auscultation, no wheezing, rales, or rhonchi GASTROINTESTINAL:  Soft, nontender, nondistended EXTREMITIES: BL BKA NEURO:  No new focal deficits appreciated SKIN:  No rashes noted PSYCH:  Appropriate mood and affect    Data Reviewed:  Imaging Studies: MR Lumbar Spine W Wo Contrast Result Date: 12/19/2023 EXAM: MRI LUMBAR SPINE 12/19/2023 09:16:00 AM TECHNIQUE:  Multiplanar multisequence MRI of the lumbar spine was performed with and without the administration of intravenous contrast. COMPARISON: MRI lumbar spine 10/10/2023 and MRI sacrum 12/15/2023. CLINICAL HISTORY: Low back pain, infection suspected.  FINDINGS: BONES AND ALIGNMENT: Straightening of the lumbar lordosis. Irregularity of the L4 inferior endplate with height loss of the L4 vertebral body, new since the prior lumbar spine MRI. Vertebral body heights otherwise maintained. SPINAL CORD: The conus terminates normally. SOFT TISSUES: Edema within the bilateral psoas muscles from the level of L3-4 extending into the pelvis to at least the superior aspect of the sacrum. Additional small focus of peripheral enhancing fluid within the left psoas muscle, best seen on series 8 image 21. Remarkable enhancing soft tissue along the posterior aspect of the L4 and L5 vertebral bodies measuring up to 0.4 cm in thickness, concerning for epidural phlegmon. L1-L2: No significant disc herniation. No spinal canal stenosis or neural foraminal narrowing. L2-L3: No significant disc herniation. No spinal canal stenosis or neural foraminal narrowing. L3-L4: No significant disc herniation. No spinal canal stenosis or neural foraminal narrowing. L4-L5: Signal abnormality within the L4-5 disc with adjacent bone marrow signal abnormality and enhancement, suggestive of discitis/osteomyelitis. Trace soft tissue swelling along the anterior aspect of the L4-5 disc with a focal 0.6 x 0.8 x 0.8 cm peripheral enhancing collection, concerning for abscess, seen on series 9 image 27 and series 8 image 15. Additional irregular peripheral enhancing collection within the left psoas, compatible with abscess, measuring 1.3 x 1.3 x 2.5 cm. Prominent disc bulge at L4-5, slightly eccentric to the left, which indents the ventral thecal sac with lateral recess narrowing. Facet arthrosis at L4-5 with moderate spinal canal stenosis at this level. Disc bulge and facet arthrosis also contribute to moderate-to-severe right and moderate left foraminal stenosis at L4-5. Moderate bilateral facet effusions at L4-5 without significant surrounding enhancement. L5-S1: No significant disc herniation. No spinal  canal stenosis or neural foraminal narrowing. IMPRESSION: 1. Discitis/osteomyelitis at L4-5 with associated paraspinal inflammatory changes, right psoas abscess, and epidural phlegmon. 2. Additional small paraspinal abscess anterior to the L4-5 disc. Possible small psoas abscess on the left. 3. Moderate spinal canal stenosis at L4-5 due to disc bulge and facet arthrosis, with moderate-to-severe right and moderate left foraminal stenosis. Electronically signed by: Donnice Mania MD 12/19/2023 11:41 AM EDT RP Workstation: [id number]   CT ABDOMEN PELVIS W CONTRAST Result Date: 12/16/2023 CLINICAL DATA:  Concern for right psoas abscess on recent sacral MRI. Right hip pain. EXAM: CT ABDOMEN AND PELVIS WITH CONTRAST TECHNIQUE: Multidetector CT imaging of the abdomen and pelvis was performed using the standard protocol following bolus administration of intravenous contrast. RADIATION DOSE REDUCTION: This exam was performed according to the departmental dose-optimization program which includes automated exposure control, adjustment of the mA and/or kV according to patient size and/or use of iterative reconstruction technique. CONTRAST:  75mL OMNIPAQUE  IOHEXOL  350 MG/ML SOLN COMPARISON:  Abdominopelvic CT 10/08/2023.  Sacral MRI 12/15/2023. FINDINGS: Technical note: Despite efforts by the technologist and patient, mild motion artifact is present on today's exam and could not be eliminated. This reduces exam sensitivity and specificity. Examination was performed with the patient in the right lateral decubitus position. Lower chest: Patchy scarring at both lung bases, similar to previous CT. Central line extends into the upper right atrium. There is coronary artery atherosclerosis. Hepatobiliary: The liver is normal in density without suspicious focal abnormality. Diffuse intra and extrahepatic biliary dilatation is again noted, similar to previous CT. The gallbladder is mildly distended  without wall thickening or definite  surrounding inflammation. Pancreas: Diffuse atrophy. No pancreatic ductal dilatation or surrounding inflammation identified. Spleen: Normal in size without focal abnormality. Adrenals/Urinary Tract: Both adrenal glands appear normal. Chronic renal cortical thinning and atrophy bilaterally with prominent renal vascular calcifications. No evidence of hydronephrosis or suspicious renal mass. The bladder appears unremarkable for its degree of distention. Stomach/Bowel: No enteric contrast administered. Mild generalized bowel wall thickening without significant distension or focal abnormality. Vascular/Lymphatic: There are no enlarged abdominal or pelvic lymph nodes. Severe aortic and branch vessel atherosclerosis without evidence of aneurysm or large vessel occlusion. Reproductive: The prostate gland and seminal vesicles appear unremarkable. Other: Mild generalized soft tissue edema consistent with anasarca. No ascites, focal extraluminal fluid collection or pneumoperitoneum. Musculoskeletal: As seen on recent pelvic MRI, there are findings of the L4-5 level suspicious for discitis and osteomyelitis. There are surrounding paraspinal inflammatory changes with heterogeneous enhancement in both psoas muscles. There is an asymmetric abscess on the right, measuring up to 3.9 cm in length on coronal image 52/6. Overall findings are similar to the recent MRI. The sacroiliac joints appear normal. Previous left femoral ORIF noted. IMPRESSION: 1. Findings of discitis and osteomyelitis at L4-5 with surrounding paraspinal inflammatory changes and right psoas abscess, similar to recent MRI. Again, this could be further evaluated with lumbar MRI. 2. No other acute findings identified in the abdomen or pelvis. 3. Chronic intra and extrahepatic biliary dilatation, similar to previous CT. 4. Chronic renal cortical thinning and atrophy bilaterally. 5.  Aortic Atherosclerosis (ICD10-I70.0). Electronically Signed   By: Elsie Perone M.D.    On: 12/16/2023 13:04   MR SACRUM SI JOINTS W WO CONTRAST Result Date: 12/15/2023 CLINICAL DATA:  Septic arthritis suspected, hip, xray done Right hip pain. EXAM: MRI PELVIS WITHOUT AND WITH CONTRAST TECHNIQUE: Multiplanar multisequence MR imaging of the pelvis with attention to the sacroiliac joints was performed both before and after administration of intravenous contrast. CONTRAST:  5mL GADAVIST  GADOBUTROL  1 MMOL/ML IV SOLN COMPARISON:  Right hip radiographs 12/14/2023. MRI of the sacrum 10/15/2023 and MRI of the right hip 10/13/2023. FINDINGS: Bones/Joint/Cartilage No evidence of sacroiliitis, osteomyelitis or acute fracture within the visualized bony pelvis. However, there is new T2 hyperintensity and heterogeneous enhancement associated with the L4-5 disc and inferior endplate of L4, concerning for discitis and osteomyelitis. Paraspinal inflammatory changes are further described below. Mild degenerative changes at the hips and sacroiliac joints without significant joint effusions. Artifact from left femoral intramedullary nail noted. Ligaments No significant ligamentous abnormalities are identified. Muscles and Tendons Heterogeneous enhancement within the psoas muscles bilaterally with a peripherally enhancing fluid collection on the right measuring up to 2.2 x 1.2 x 4.1 cm, consistent with an abscess. No significant pelvic muscular abnormalities are identified. Soft tissue As above, findings suspicious for discitis and osteomyelitis at L4-5. There is heterogeneous paraspinal enhancement with extension into the psoas musculature as described above. No abscess or other focal fluid collection identified within the pelvis. IMPRESSION: 1. Findings are consistent with discitis and osteomyelitis at L4-5 with associated paraspinal inflammatory changes and right psoas abscess. Recommend dedicated lumbar MRI without and with contrast. 2. No evidence of sacroiliitis, osteomyelitis or septic arthritis within the  pelvis. 3. Mild degenerative changes at the hips and sacroiliac joints. Electronically Signed   By: Elsie Perone M.D.   On: 12/15/2023 10:36   DG Hip Unilat W or Wo Pelvis 2-3 Views Right Result Date: 12/14/2023 CLINICAL DATA:  Right hip pain. EXAM: DG HIP (WITH OR  WITHOUT PELVIS) 2-3V RIGHT COMPARISON:  None Available. FINDINGS: There is no evidence of acute hip fracture or dislocation. A radiopaque intramedullary rod and associated fixation screw are seen within the proximal left femoral shaft. There is no evidence of arthropathy or other focal bone abnormality. Marked severity vascular calcification is noted. IMPRESSION: 1. No acute findings. 2. Prior ORIF of the left femur. Electronically Signed   By: Suzen Dials M.D.   On: 12/14/2023 23:29    There are no new results to review at this time.  Previous records (including but not limited to H&P, progress notes, nursing notes, TOC management) were reviewed in assessment of this patient.  Labs: CBC: Recent Labs  Lab 12/14/23 2332 12/17/23 0840 12/18/23 0635 12/19/23 0450  WBC 10.1 9.1 10.9* 8.1  NEUTROABS 7.7  --   --   --   HGB 10.0* 9.0* 9.4* 9.7*  HCT 30.9* 28.9* 29.7* 30.8*  MCV 99.7 101.0* 102.1* 100.3*  PLT 249 249 261 257   Basic Metabolic Panel: Recent Labs  Lab 12/14/23 2332 12/15/23 0608 12/17/23 0840 12/18/23 0635 12/19/23 0450  NA 131* 134* 132* 133* 136  K 3.7 3.4* 4.1 4.3 4.7  CL 94* 93* 93* 97* 99  CO2 23 23 22 23  19*  GLUCOSE 85 60* 161* 162* 142*  BUN 37* 39* 29* 19 30*  CREATININE 8.38* 9.06* 6.75* 5.01* 6.48*  CALCIUM  9.0 9.2 8.8* 9.0 9.1  MG  --   --   --  2.0 2.2  PHOS  --   --  7.3*  --  5.6*   Liver Function Tests: Recent Labs  Lab 12/15/23 0608 12/17/23 0840  AST 19  --   ALT 9  --   ALKPHOS 74  --   BILITOT 1.8*  --   PROT 10.3*  --   ALBUMIN  2.7* 2.3*   CBG: Recent Labs  Lab 12/19/23 1140 12/19/23 1801 12/19/23 2102 12/20/23 0803 12/20/23 1204  GLUCAP 142* 78 128* 95  102*    Scheduled Meds:  acetaminophen   1,000 mg Oral TID   bictegravir-emtricitabine -tenofovir  AF  1 tablet Oral Daily   Chlorhexidine  Gluconate Cloth  6 each Topical Q0600   Chlorhexidine  Gluconate Cloth  6 each Topical Q0600   gabapentin   100 mg Oral TID   heparin   5,000 Units Subcutaneous Q8H   heparin  sodium (porcine)  4,000 Units Intracatheter Once   insulin  aspart  0-5 Units Subcutaneous QHS   insulin  aspart  0-6 Units Subcutaneous TID WC   insulin  aspart  2 Units Subcutaneous TID WC   lidocaine   1 patch Transdermal Q24H   methocarbamol   500 mg Oral TID   sevelamer  carbonate  800 mg Oral TID WC   sodium chloride  flush  10-40 mL Intracatheter Q12H   Continuous Infusions:  ceFEPime  (MAXIPIME ) IV 1 g (12/19/23 2148)   vancomycin  Stopped (12/19/23 1723)   PRN Meds:.midodrine , oxyCODONE , sodium chloride  flush  Family Communication: None at bedside  Disposition: Status is: Inpatient Remains inpatient appropriate because: See above     Time spent: 36 minutes  Length of inpatient stay: 5 days  Author: Carliss LELON Canales, DO 12/20/2023 12:49 PM  For on call review [email or web address].

## 2023-12-20 NOTE — Progress Notes (Signed)
 [facility] Progress Note   Subjective:    Seen and examined patient at bedside. Appears comfortable. Tolerated yesterday's hD with net uF 1L.   Objective Vitals:   12/19/23 1932 12/20/23 0011 12/20/23 0357 12/20/23 0800  BP: (!) 139/98 116/72 118/72 100/77  Pulse: 88 83 72 82  Resp: 16 16 16 15   Temp: 97.6 F (36.4 C) 98.3 F (36.8 C) 98.1 F (36.7 C) 98.4 F (36.9 C)  TempSrc: Oral Oral Oral Oral  SpO2: 98% 92% 100% 96%  Weight:      Height:       Physical Exam General: Awake, alert, NAD Heart: RRR; No murmurs Lungs: Clear anteriorly Abdomen: Soft and non-tender Extremities: No LE edema Dialysis Access: [facility]   Filed Weights   12/19/23 1343 12/19/23 1344 12/19/23 1731  Weight: 43.6 kg 43.6 kg 42.2 kg    Intake/Output Summary (Last 24 hours) at 12/20/2023 1125 Last data filed at 12/20/2023 0300 Gross per 24 hour  Intake 506.54 ml  Output 1600 ml  Net -1093.46 ml    Additional Objective Labs: Basic Metabolic Panel: Recent Labs  Lab 12/17/23 0840 12/18/23 0635 12/19/23 0450  NA 132* 133* 136  K 4.1 4.3 4.7  CL 93* 97* 99  CO2 22 23 19*  GLUCOSE 161* 162* 142*  BUN 29* 19 30*  CREATININE 6.75* 5.01* 6.48*  CALCIUM  8.8* 9.0 9.1  PHOS 7.3*  --  5.6*   Liver Function Tests: Recent Labs  Lab 12/15/23 0608 12/17/23 0840  AST 19  --   ALT 9  --   ALKPHOS 74  --   BILITOT 1.8*  --   PROT 10.3*  --   ALBUMIN  2.7* 2.3*   No results for input(s): LIPASE, AMYLASE in the last 168 hours. CBC: Recent Labs  Lab 12/14/23 2332 12/17/23 0840 12/18/23 0635 12/19/23 0450  WBC 10.1 9.1 10.9* 8.1  NEUTROABS 7.7  --   --   --   HGB 10.0* 9.0* 9.4* 9.7*  HCT 30.9* 28.9* 29.7* 30.8*  MCV 99.7 101.0* 102.1* 100.3*  PLT 249 249 261 257   Blood Culture    Component Value Date/Time   SDES BLOOD RIGHT ARM 12/15/2023 0613   SPECREQUEST  12/15/2023 0613    BOTTLES DRAWN AEROBIC AND ANAEROBIC Blood Culture adequate volume   CULT   12/15/2023 0613    NO GROWTH 5 DAYS Performed at [facility], [address], [place], [place] [address]    REPTSTATUS 12/20/2023 FINAL 12/15/2023 9386    Cardiac Enzymes: No results for input(s): CKTOTAL, CKMB, CKMBINDEX, TROPONINI in the last 168 hours. CBG: Recent Labs  Lab 12/19/23 0754 12/19/23 1140 12/19/23 1801 12/19/23 2102 12/20/23 0803  GLUCAP 136* 142* 78 128* 95   Iron  Studies: No results for input(s): IRON , TIBC, TRANSFERRIN, FERRITIN in the last 72 hours. Lab Results  Component Value Date   INR 1.1 12/15/2023   INR 1.2 10/08/2023   INR 1.2 02/28/2020   Studies/Results: MR Lumbar Spine W Wo Contrast Result Date: 12/19/2023 EXAM: MRI LUMBAR SPINE 12/19/2023 09:16:00 AM TECHNIQUE: Multiplanar multisequence MRI of the lumbar spine was performed with and without the administration of intravenous contrast. COMPARISON: MRI lumbar spine 10/10/2023 and MRI sacrum 12/15/2023. CLINICAL HISTORY: Low back pain, infection suspected. FINDINGS: BONES AND ALIGNMENT: Straightening of the lumbar lordosis. Irregularity of the L4 inferior endplate with height loss of the L4 vertebral body, new since the prior lumbar spine MRI. Vertebral body heights otherwise maintained. SPINAL  CORD: The conus terminates normally. SOFT TISSUES: Edema within the bilateral psoas muscles from the level of L3-4 extending into the pelvis to at least the superior aspect of the sacrum. Additional small focus of peripheral enhancing fluid within the left psoas muscle, best seen on series 8 image 21. Remarkable enhancing soft tissue along the posterior aspect of the L4 and L5 vertebral bodies measuring up to 0.4 cm in thickness, concerning for epidural phlegmon. L1-L2: No significant disc herniation. No spinal canal stenosis or neural foraminal narrowing. L2-L3: No significant disc herniation. No spinal canal stenosis or neural foraminal narrowing. L3-L4: No significant disc herniation. No spinal  canal stenosis or neural foraminal narrowing. L4-L5: Signal abnormality within the L4-5 disc with adjacent bone marrow signal abnormality and enhancement, suggestive of discitis/osteomyelitis. Trace soft tissue swelling along the anterior aspect of the L4-5 disc with a focal 0.6 x 0.8 x 0.8 cm peripheral enhancing collection, concerning for abscess, seen on series 9 image 27 and series 8 image 15. Additional irregular peripheral enhancing collection within the left psoas, compatible with abscess, measuring 1.3 x 1.3 x 2.5 cm. Prominent disc bulge at L4-5, slightly eccentric to the left, which indents the ventral thecal sac with lateral recess narrowing. Facet arthrosis at L4-5 with moderate spinal canal stenosis at this level. Disc bulge and facet arthrosis also contribute to moderate-to-severe right and moderate left foraminal stenosis at L4-5. Moderate bilateral facet effusions at L4-5 without significant surrounding enhancement. L5-S1: No significant disc herniation. No spinal canal stenosis or neural foraminal narrowing. IMPRESSION: 1. Discitis/osteomyelitis at L4-5 with associated paraspinal inflammatory changes, right psoas abscess, and epidural phlegmon. 2. Additional small paraspinal abscess anterior to the L4-5 disc. Possible small psoas abscess on the left. 3. Moderate spinal canal stenosis at L4-5 due to disc bulge and facet arthrosis, with moderate-to-severe right and moderate left foraminal stenosis. Electronically signed by: Donnice Mania MD 12/19/2023 11:41 AM EDT RP Workstation: [id number]    Medications:  ceFEPime  (MAXIPIME ) IV 1 g (12/19/23 2148)   vancomycin  Stopped (12/19/23 1723)    acetaminophen   1,000 mg Oral TID   bictegravir-emtricitabine -tenofovir  AF  1 tablet Oral Daily   Chlorhexidine  Gluconate Cloth  6 each Topical Q0600   Chlorhexidine  Gluconate Cloth  6 each Topical Q0600   gabapentin   100 mg Oral TID   heparin   5,000 Units Subcutaneous Q8H   heparin  sodium (porcine)  4,000  Units Intracatheter Once   insulin  aspart  0-5 Units Subcutaneous QHS   insulin  aspart  0-6 Units Subcutaneous TID WC   insulin  aspart  2 Units Subcutaneous TID WC   lidocaine   1 patch Transdermal Q24H   methocarbamol   500 mg Oral TID   sevelamer  carbonate  800 mg Oral TID WC   sodium chloride  flush  10-40 mL Intracatheter Q12H    Dialysis Orders: [facility] 3.5h  B400  45.5kg  TDC  Heparin  2000 Mircera 100 mcg - last dose 12/01/23 Calcitriol  1.0 mcg (on hold due to high Ca)   Home bp meds: Midodrine  10mg  tid prn for SBP < 100 Midodrine  10mg  pre HD mwf prn  Assessment/Plan: 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 Blood cx's sent, IV vanc/ cefepime  started. Discussed with ID and pharmacy: plan for IV Cefepime  2GM and Vancomycin  500mg  with HD X 8 weeks end date 02/09/24.  ESRD: on HD MWF. Next HD 8/11. BP: chronic low BP on midodrine  10 tid prn and 10mg  pre HD mwf as needed for SBP < 100. Can give  albumin  during HD for low BP.  Volume: not a big volume gainer, limits wt gains. 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 HIV: controlled on Biktarvy  BMD: Corrected Ca 10.4 and phos high. Did not see phos binder on his OP med rec. Start sevelamer  TID WC. VDRA on hold due to high Ca.  Charmaine Piety, NP [facility] 12/20/2023,11:25 AM  LOS: 5 days

## 2023-12-20 NOTE — Plan of Care (Signed)
   Problem: Clinical Measurements: Goal: Cardiovascular complication will be avoided Outcome: Progressing   Problem: Activity: Goal: Risk for activity intolerance will decrease Outcome: Progressing

## 2023-12-21 DIAGNOSIS — M4646 Discitis, unspecified, lumbar region: Secondary | ICD-10-CM

## 2023-12-21 LAB — GLUCOSE, CAPILLARY
Glucose-Capillary: 164 mg/dL — ABNORMAL HIGH (ref 70–99)
Glucose-Capillary: 167 mg/dL — ABNORMAL HIGH (ref 70–99)
Glucose-Capillary: 116 mg/dL — ABNORMAL HIGH (ref 70–99)
Glucose-Capillary: 148 mg/dL — ABNORMAL HIGH (ref 70–99)

## 2023-12-21 MED ORDER — CHLORHEXIDINE GLUCONATE CLOTH 2 % EX PADS
6.0000 | MEDICATED_PAD | Freq: Every day | CUTANEOUS | Status: DC
  Administered 2023-12-22 – 2023-12-23 (×4): 6 via TOPICAL

## 2023-12-21 NOTE — Progress Notes (Addendum)
 [facility] Progress Note   Subjective:    Seen and examined patient at bedside. Reports ongoing L hip pain. Next HD 8/11.  Objective Vitals:   12/20/23 1605 12/20/23 2019 12/21/23 0407 12/21/23 0745  BP: 98/67 112/75 121/86 97/73  Pulse: 70 84 78 78  Resp: 20 16 16 18   Temp: 98 F (36.7 C) 97.6 F (36.4 C) 97.6 F (36.4 C) 98.2 F (36.8 C)  TempSrc: Oral Oral Oral Oral  SpO2: 98% 99% 100% 95%  Weight:      Height:       Physical Exam General: Awake, alert, NAD Heart: RRR; No murmurs Lungs: Clear anteriorly Abdomen: Soft and non-tender Extremities: B/l BKAs; No edema b/l stumps Dialysis Access: [facility]   Filed Weights   12/19/23 1343 12/19/23 1344 12/19/23 1731  Weight: 43.6 kg 43.6 kg 42.2 kg   No intake or output data in the 24 hours ending 12/21/23 1114  Additional Objective Labs: Basic Metabolic Panel: Recent Labs  Lab 12/17/23 0840 12/18/23 0635 12/19/23 0450  NA 132* 133* 136  K 4.1 4.3 4.7  CL 93* 97* 99  CO2 22 23 19*  GLUCOSE 161* 162* 142*  BUN 29* 19 30*  CREATININE 6.75* 5.01* 6.48*  CALCIUM  8.8* 9.0 9.1  PHOS 7.3*  --  5.6*   Liver Function Tests: Recent Labs  Lab 12/15/23 0608 12/17/23 0840  AST 19  --   ALT 9  --   ALKPHOS 74  --   BILITOT 1.8*  --   PROT 10.3*  --   ALBUMIN  2.7* 2.3*   No results for input(s): LIPASE, AMYLASE in the last 168 hours. CBC: Recent Labs  Lab 12/14/23 2332 12/17/23 0840 12/18/23 0635 12/19/23 0450  WBC 10.1 9.1 10.9* 8.1  NEUTROABS 7.7  --   --   --   HGB 10.0* 9.0* 9.4* 9.7*  HCT 30.9* 28.9* 29.7* 30.8*  MCV 99.7 101.0* 102.1* 100.3*  PLT 249 249 261 257   Blood Culture    Component Value Date/Time   SDES BLOOD RIGHT ARM 12/15/2023 0613   SPECREQUEST  12/15/2023 0613    BOTTLES DRAWN AEROBIC AND ANAEROBIC Blood Culture adequate volume   CULT  12/15/2023 0613    NO GROWTH 5 DAYS Performed at [facility], [address] [place], [place] [address]     REPTSTATUS 12/20/2023 FINAL 12/15/2023 9386    Cardiac Enzymes: No results for input(s): CKTOTAL, CKMB, CKMBINDEX, TROPONINI in the last 168 hours. CBG: Recent Labs  Lab 12/20/23 0803 12/20/23 1204 12/20/23 1607 12/20/23 2048 12/21/23 0743  GLUCAP 95 102* 113* 175* 164*   Iron  Studies: No results for input(s): IRON , TIBC, TRANSFERRIN, FERRITIN in the last 72 hours. Lab Results  Component Value Date   INR 1.1 12/15/2023   INR 1.2 10/08/2023   INR 1.2 02/28/2020   Studies/Results: No results found.  Medications:  ceFEPime  (MAXIPIME ) IV 1 g (12/20/23 2057)   vancomycin  Stopped (12/19/23 1723)    acetaminophen   1,000 mg Oral TID   bictegravir-emtricitabine -tenofovir  AF  1 tablet Oral Daily   Chlorhexidine  Gluconate Cloth  6 each Topical Q0600   Chlorhexidine  Gluconate Cloth  6 each Topical Q0600   gabapentin   100 mg Oral TID   heparin   5,000 Units Subcutaneous Q8H   heparin  sodium (porcine)  4,000 Units Intracatheter Once   insulin  aspart  0-5 Units Subcutaneous QHS   insulin  aspart  0-6 Units Subcutaneous TID WC   insulin  aspart  2 Units Subcutaneous TID WC   lidocaine   1 patch Transdermal Q24H   methocarbamol   500 mg Oral TID   sevelamer  carbonate  800 mg Oral TID WC   sodium chloride  flush  10-40 mL Intracatheter Q12H    Dialysis Orders: [facility] 3.5h  B400  45.5kg  TDC  Heparin  2000 Mircera 100 mcg - last dose 12/01/23 Calcitriol  1.0 mcg (on hold due to high Ca)   Home bp meds: Midodrine  10mg  tid prn for SBP < 100 Midodrine  10mg  pre HD mwf prn  Assessment/Plan: 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 Blood cx's sent, IV vanc/ cefepime  started. Discussed with ID and pharmacy: plan for IV Cefepime  2GM and Vancomycin  500mg  with HD X 8 weeks end date 02/09/24.  ESRD: on HD MWF. Next HD 8/11. BP: chronic low BP on midodrine  10 tid prn and 10mg  pre HD mwf as needed for SBP < 100. Can give albumin  during HD for low BP.   Volume: not a big volume gainer, limits wt gains. 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 HIV: controlled on Biktarvy  BMD: Corrected Ca 10.4 and phos high. Did not see phos binder on his OP med rec. Start sevelamer  TID WC. VDRA on hold due to high Ca.  Charmaine Piety, NP [facility] 12/21/2023,11:14 AM  LOS: 6 days

## 2023-12-21 NOTE — Progress Notes (Signed)
 Progress Note   Patient: Alvin Daniels FMW:[id number] DOB: 05-28-1970 DOA: 12/14/2023  DOS: the patient was seen and examined on 12/21/2023   Brief hospital course:  53 y.o. 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 coli and Streptococcus anginosus who presents with hip pain MRI was done that showed a right psoas abscess and L4-L5 discitis and osteomyelitis    Assessment and Plan:   L4-L5 osteomyelitis/discitis with right psoas abscess - Coverage empirically with cefepime  plus vancomycin .  MRI sacrum noting a 2.2 x 1 x 4.1 cm right psoas abscess.  Repeat MRI this morning noting discitis/osteomyelitis at L4-L5, right psoas abscess, and epidural phlegmon.  Also noting small paraspinal abscess anterior to L4-L5 as well as possible small left psoas abscess.  IR consulted for possible drainage however no safe access appreciated.  Not a surgical candidate.  Infectious disease consulted recommending conservative management with IV cefepime  plus vancomycin .  Patient received cefepime  plus vancomycin  with HD for total of 8 weeks, end date 02/09/2024.   ESRD on HD MWF - Nephrology following closely.  Tolerated dialysis well.  Next HD 8/11.   Chronic hypotension - Likely exacerbated by infection and poor p.o. intake.  Midodrine  on board.  Nephrology following closely during HD.   Uncontrolled diabetes mellitus - Previous A1c 10 suggesting very poor control.  Currently on insulin  sliding scale.   HIV/chronic hep C - ID following.  Continue Biktarvy .   Severe protein calorie malnutrition - Protein supplementation encouraged.   Bilateral BKA with increased hip/leg pain - Contributing to physical debilitation  and muscle weakness.  BL prosthetics in room.  Patient complaining of worsening pain with ambulation.  Physical therapy following closely.  Increased patient's pain regimen to include oxycodone  10 mg every 4 hours, lidocaine  patch, gabapentin .  Received IV Toradol  x 1.  Patient said that he felt improved today.  Would like him to attempt to participate in ambulation as it is the limiting factor in in his discharge home.  Would need to ensure patient is able to be ambulatory prior to discharge.   Goals of care - Disposition planning with patient, TOC, infectious disease, nephrology.  Infectious disease consulted recommending conservative management with IV cefepime  plus vancomycin .  Patient received cefepime  plus vancomycin  with HD for total of 8 weeks, end date 02/09/2024.  Repeat MRI recommended by infectious disease as well, to be scheduled in the outpatient setting.  Working closely with TOC to ensure this is all set up prior to discharge home.  SNF not an option given patient is not a [place]  citizen.   Subjective: Patient resting comfortably this morning.  Discussion with tablet interpreter.  Patient still complaining of increased pain in his hip and back and sometimes down his leg when attempting to ambulate with his prosthesis.  This is the main complaint that brought him into the hospital.  Increasing his pain medications yesterday did show some improvement.  Does not attempt to ambulate this morning yet.  Stated that his pain is exacerbated by his infection and will not go away totally until his infection is cleared which may be many weeks.  The goal is to find a pain regiment that will help control his symptoms.  Physical Exam:  Vitals:   12/20/23 1605 12/20/23 2019 12/21/23 0407 12/21/23 0745  BP:  98/67 112/75 121/86 97/73  Pulse: 70 84 78 78  Resp: 20 16 16 18   Temp: 98 F (36.7 C) 97.6 F (36.4 C) 97.6 F (36.4 C) 98.2 F (36.8 C)  TempSrc: Oral Oral Oral Oral  SpO2: 98% 99% 100% 95%   Weight:      Height:        GENERAL:  Alert, pleasant, frail, cachectic, disheveled HEENT:  EOMI CARDIOVASCULAR:  RRR, no murmurs appreciated RESPIRATORY:  Clear to auscultation, no wheezing, rales, or rhonchi GASTROINTESTINAL:  Soft, nontender, nondistended EXTREMITIES: BL BKA NEURO:  No new focal deficits appreciated SKIN:  No rashes noted PSYCH:  Appropriate mood and affect    Data Reviewed:  Imaging Studies: MR Lumbar Spine W Wo Contrast Result Date: 12/19/2023 EXAM: MRI LUMBAR SPINE 12/19/2023 09:16:00 AM TECHNIQUE: Multiplanar multisequence MRI of the lumbar spine was performed with and without the administration of intravenous contrast. COMPARISON: MRI lumbar spine 10/10/2023 and MRI sacrum 12/15/2023. CLINICAL HISTORY: Low back pain, infection suspected. FINDINGS: BONES AND ALIGNMENT: Straightening of the lumbar lordosis. Irregularity of the L4 inferior endplate with height loss of the L4 vertebral body, new since the prior lumbar spine MRI. Vertebral body heights otherwise maintained. SPINAL CORD: The conus terminates normally. SOFT TISSUES: Edema within the bilateral psoas muscles from the level of L3-4 extending into the pelvis to at least the superior aspect of the sacrum. Additional small focus of peripheral enhancing fluid within the left psoas muscle, best seen on series 8 image 21. Remarkable enhancing soft tissue along the posterior aspect of the L4 and L5 vertebral bodies measuring up to 0.4 cm in thickness, concerning for epidural phlegmon. L1-L2: No significant disc herniation. No spinal canal stenosis or neural foraminal narrowing. L2-L3: No significant disc herniation. No spinal canal stenosis or neural foraminal narrowing. L3-L4: No significant disc herniation. No spinal canal stenosis or neural foraminal narrowing. L4-L5: Signal abnormality within the L4-5 disc with adjacent bone marrow signal abnormality and enhancement, suggestive of discitis/osteomyelitis. Trace soft  tissue swelling along the anterior aspect of the L4-5 disc with a focal 0.6 x 0.8 x 0.8 cm peripheral enhancing collection, concerning for abscess, seen on series 9 image 27 and series 8 image 15. Additional irregular peripheral enhancing collection within the left psoas, compatible with abscess, measuring 1.3 x 1.3 x 2.5 cm. Prominent disc bulge at L4-5, slightly eccentric to the left, which indents the ventral thecal sac with lateral recess narrowing. Facet arthrosis at L4-5 with moderate spinal canal stenosis at this level. Disc bulge and facet arthrosis also contribute to moderate-to-severe right and moderate left foraminal stenosis at L4-5. Moderate bilateral facet effusions at L4-5 without significant surrounding enhancement. L5-S1: No significant disc herniation. No spinal canal stenosis or neural foraminal narrowing. IMPRESSION: 1. Discitis/osteomyelitis at L4-5 with associated paraspinal inflammatory changes, right psoas abscess, and epidural phlegmon. 2. Additional small paraspinal abscess anterior to the L4-5 disc. Possible small psoas abscess on the left. 3. Moderate spinal canal stenosis at L4-5 due to disc bulge and facet arthrosis, with moderate-to-severe right and moderate left foraminal stenosis. Electronically signed by: Donnice Mania MD 12/19/2023 11:41 AM EDT RP Workstation: [id number]   CT ABDOMEN PELVIS W CONTRAST Result Date: 12/16/2023 CLINICAL DATA:  Concern for right psoas abscess on recent sacral MRI. Right hip pain. EXAM: CT ABDOMEN AND PELVIS WITH CONTRAST TECHNIQUE: Multidetector CT imaging of the abdomen and pelvis was performed using the standard protocol following bolus administration of intravenous contrast. RADIATION DOSE REDUCTION: This exam was performed according  to the departmental dose-optimization program which includes automated exposure control, adjustment of the mA and/or kV according to patient size and/or use of iterative reconstruction technique. CONTRAST:  75mL  OMNIPAQUE  IOHEXOL  350 MG/ML SOLN COMPARISON:  Abdominopelvic CT 10/08/2023.  Sacral MRI 12/15/2023. FINDINGS: Technical note: Despite efforts by the technologist and patient, mild motion artifact is present on today's exam and could not be eliminated. This reduces exam sensitivity and specificity. Examination was performed with the patient in the right lateral decubitus position. Lower chest: Patchy scarring at both lung bases, similar to previous CT. Central line extends into the upper right atrium. There is coronary artery atherosclerosis. Hepatobiliary: The liver is normal in density without suspicious focal abnormality. Diffuse intra and extrahepatic biliary dilatation is again noted, similar to previous CT. The gallbladder is mildly distended without wall thickening or definite surrounding inflammation. Pancreas: Diffuse atrophy. No pancreatic ductal dilatation or surrounding inflammation identified. Spleen: Normal in size without focal abnormality. Adrenals/Urinary Tract: Both adrenal glands appear normal. Chronic renal cortical thinning and atrophy bilaterally with prominent renal vascular calcifications. No evidence of hydronephrosis or suspicious renal mass. The bladder appears unremarkable for its degree of distention. Stomach/Bowel: No enteric contrast administered. Mild generalized bowel wall thickening without significant distension or focal abnormality. Vascular/Lymphatic: There are no enlarged abdominal or pelvic lymph nodes. Severe aortic and branch vessel atherosclerosis without evidence of aneurysm or large vessel occlusion. Reproductive: The prostate gland and seminal vesicles appear unremarkable. Other: Mild generalized soft tissue edema consistent with anasarca. No ascites, focal extraluminal fluid collection or pneumoperitoneum. Musculoskeletal: As seen on recent pelvic MRI, there are findings of the L4-5 level suspicious for discitis and osteomyelitis. There are surrounding paraspinal  inflammatory changes with heterogeneous enhancement in both psoas muscles. There is an asymmetric abscess on the right, measuring up to 3.9 cm in length on coronal image 52/6. Overall findings are similar to the recent MRI. The sacroiliac joints appear normal. Previous left femoral ORIF noted. IMPRESSION: 1. Findings of discitis and osteomyelitis at L4-5 with surrounding paraspinal inflammatory changes and right psoas abscess, similar to recent MRI. Again, this could be further evaluated with lumbar MRI. 2. No other acute findings identified in the abdomen or pelvis. 3. Chronic intra and extrahepatic biliary dilatation, similar to previous CT. 4. Chronic renal cortical thinning and atrophy bilaterally. 5.  Aortic Atherosclerosis (ICD10-I70.0). Electronically Signed   By: Elsie Perone M.D.   On: 12/16/2023 13:04   MR SACRUM SI JOINTS W WO CONTRAST Result Date: 12/15/2023 CLINICAL DATA:  Septic arthritis suspected, hip, xray done Right hip pain. EXAM: MRI PELVIS WITHOUT AND WITH CONTRAST TECHNIQUE: Multiplanar multisequence MR imaging of the pelvis with attention to the sacroiliac joints was performed both before and after administration of intravenous contrast. CONTRAST:  5mL GADAVIST  GADOBUTROL  1 MMOL/ML IV SOLN COMPARISON:  Right hip radiographs 12/14/2023. MRI of the sacrum 10/15/2023 and MRI of the right hip 10/13/2023. FINDINGS: Bones/Joint/Cartilage No evidence of sacroiliitis, osteomyelitis or acute fracture within the visualized bony pelvis. However, there is new T2 hyperintensity and heterogeneous enhancement associated with the L4-5 disc and inferior endplate of L4, concerning for discitis and osteomyelitis. Paraspinal inflammatory changes are further described below. Mild degenerative changes at the hips and sacroiliac joints without significant joint effusions. Artifact from left femoral intramedullary nail noted. Ligaments No significant ligamentous abnormalities are identified. Muscles and Tendons  Heterogeneous enhancement within the psoas muscles bilaterally with a peripherally enhancing fluid collection on the right measuring up to 2.2 x 1.2 x 4.1 cm,  consistent with an abscess. No significant pelvic muscular abnormalities are identified. Soft tissue As above, findings suspicious for discitis and osteomyelitis at L4-5. There is heterogeneous paraspinal enhancement with extension into the psoas musculature as described above. No abscess or other focal fluid collection identified within the pelvis. IMPRESSION: 1. Findings are consistent with discitis and osteomyelitis at L4-5 with associated paraspinal inflammatory changes and right psoas abscess. Recommend dedicated lumbar MRI without and with contrast. 2. No evidence of sacroiliitis, osteomyelitis or septic arthritis within the pelvis. 3. Mild degenerative changes at the hips and sacroiliac joints. Electronically Signed   By: Elsie Perone M.D.   On: 12/15/2023 10:36   DG Hip Unilat W or Wo Pelvis 2-3 Views Right Result Date: 12/14/2023 CLINICAL DATA:  Right hip pain. EXAM: DG HIP (WITH OR WITHOUT PELVIS) 2-3V RIGHT COMPARISON:  None Available. FINDINGS: There is no evidence of acute hip fracture or dislocation. A radiopaque intramedullary rod and associated fixation screw are seen within the proximal left femoral shaft. There is no evidence of arthropathy or other focal bone abnormality. Marked severity vascular calcification is noted. IMPRESSION: 1. No acute findings. 2. Prior ORIF of the left femur. Electronically Signed   By: Suzen Dials M.D.   On: 12/14/2023 23:29    There are no new results to review at this time.  Previous records (including but not limited to H&P, progress notes, nursing notes, TOC management) were reviewed in assessment of this patient.  Labs: CBC: Recent Labs  Lab 12/14/23 2332 12/17/23 0840 12/18/23 0635 12/19/23 0450  WBC 10.1 9.1 10.9* 8.1  NEUTROABS 7.7  --   --   --   HGB 10.0* 9.0* 9.4* 9.7*  HCT  30.9* 28.9* 29.7* 30.8*  MCV 99.7 101.0* 102.1* 100.3*  PLT 249 249 261 257   Basic Metabolic Panel: Recent Labs  Lab 12/14/23 2332 12/15/23 0608 12/17/23 0840 12/18/23 0635 12/19/23 0450  NA 131* 134* 132* 133* 136  K 3.7 3.4* 4.1 4.3 4.7  CL 94* 93* 93* 97* 99  CO2 23 23 22 23  19*  GLUCOSE 85 60* 161* 162* 142*  BUN 37* 39* 29* 19 30*  CREATININE 8.38* 9.06* 6.75* 5.01* 6.48*  CALCIUM  9.0 9.2 8.8* 9.0 9.1  MG  --   --   --  2.0 2.2  PHOS  --   --  7.3*  --  5.6*   Liver Function Tests: Recent Labs  Lab 12/15/23 0608 12/17/23 0840  AST 19  --   ALT 9  --   ALKPHOS 74  --   BILITOT 1.8*  --   PROT 10.3*  --   ALBUMIN  2.7* 2.3*   CBG: Recent Labs  Lab 12/20/23 1204 12/20/23 1607 12/20/23 2048 12/21/23 0743 12/21/23 1201  GLUCAP 102* 113* 175* 164* 167*    Scheduled Meds:  acetaminophen   1,000 mg Oral TID   bictegravir-emtricitabine -tenofovir  AF  1 tablet Oral Daily   [START ON 12/22/2023] Chlorhexidine  Gluconate Cloth  6 each Topical Q0600   gabapentin   100 mg Oral TID   heparin   5,000 Units Subcutaneous Q8H   heparin  sodium (porcine)  4,000 Units Intracatheter Once   insulin  aspart  0-5 Units Subcutaneous QHS   insulin  aspart  0-6 Units Subcutaneous TID WC   insulin  aspart  2 Units Subcutaneous TID WC   lidocaine   1 patch Transdermal Q24H   methocarbamol   500 mg Oral TID   sevelamer  carbonate  800 mg Oral TID WC   sodium chloride   flush  10-40 mL Intracatheter Q12H   Continuous Infusions:  ceFEPime  (MAXIPIME ) IV 1 g (12/20/23 2057)   vancomycin  Stopped (12/19/23 1723)   PRN Meds:.midodrine , oxyCODONE , sodium chloride  flush  Family Communication: None at bedside  Disposition: Status is: Inpatient Remains inpatient appropriate because: See above     Time spent: 38 minutes  Length of inpatient stay: 6 days  Author: Carliss LELON Canales, DO 12/21/2023 2:19 PM  For on call review [email or web address].

## 2023-12-21 NOTE — Plan of Care (Signed)

## 2023-12-22 LAB — CBC WITH DIFFERENTIAL/PLATELET
Abs Immature Granulocytes: 0.01 K/uL (ref 0.00–0.07)
Basophils Absolute: 0.1 K/uL (ref 0.0–0.1)
Basophils Relative: 1 %
Eosinophils Absolute: 0.2 K/uL (ref 0.0–0.5)
Eosinophils Relative: 3 %
HCT: 28.3 % — ABNORMAL LOW (ref 39.0–52.0)
Hemoglobin: 8.9 g/dL — ABNORMAL LOW (ref 13.0–17.0)
Immature Granulocytes: 0 %
Lymphocytes Relative: 16 %
Lymphs Abs: 1.2 K/uL (ref 0.7–4.0)
MCH: 31.7 pg (ref 26.0–34.0)
MCHC: 31.4 g/dL (ref 30.0–36.0)
MCV: 100.7 fL — ABNORMAL HIGH (ref 80.0–100.0)
Monocytes Absolute: 0.5 K/uL (ref 0.1–1.0)
Monocytes Relative: 7 %
Neutro Abs: 5.3 K/uL (ref 1.7–7.7)
Neutrophils Relative %: 73 %
Platelets: 227 K/uL (ref 150–400)
RBC: 2.81 MIL/uL — ABNORMAL LOW (ref 4.22–5.81)
RDW: 14.8 % (ref 11.5–15.5)
WBC: 7.3 K/uL (ref 4.0–10.5)
nRBC: 0 % (ref 0.0–0.2)

## 2023-12-22 LAB — RENAL FUNCTION PANEL
Albumin: 2.3 g/dL — ABNORMAL LOW (ref 3.5–5.0)
Anion gap: 17 — ABNORMAL HIGH (ref 5–15)
BUN: 35 mg/dL — ABNORMAL HIGH (ref 6–20)
CO2: 21 mmol/L — ABNORMAL LOW (ref 22–32)
Calcium: 9.1 mg/dL (ref 8.9–10.3)
Chloride: 89 mmol/L — ABNORMAL LOW (ref 98–111)
Creatinine, Ser: 7.4 mg/dL — ABNORMAL HIGH (ref 0.61–1.24)
GFR, Estimated: 8 mL/min — ABNORMAL LOW (ref >60)
Glucose, Bld: 162 mg/dL — ABNORMAL HIGH (ref 70–99)
Phosphorus: 5.3 mg/dL — ABNORMAL HIGH (ref 2.5–4.6)
Potassium: 4 mmol/L (ref 3.5–5.1)
Sodium: 127 mmol/L — ABNORMAL LOW (ref 135–145)

## 2023-12-22 LAB — GLUCOSE, CAPILLARY
Glucose-Capillary: 159 mg/dL — ABNORMAL HIGH (ref 70–99)
Glucose-Capillary: 113 mg/dL — ABNORMAL HIGH (ref 70–99)
Glucose-Capillary: 166 mg/dL — ABNORMAL HIGH (ref 70–99)
Glucose-Capillary: 50 mg/dL — ABNORMAL LOW (ref 70–99)
Glucose-Capillary: 77 mg/dL (ref 70–99)

## 2023-12-22 LAB — VANCOMYCIN, RANDOM: Vancomycin Rm: 21 ug/mL

## 2023-12-22 MED ORDER — VANCOMYCIN HCL 500 MG/100ML IV SOLN
INTRAVENOUS | Status: AC
  Filled 2023-12-22: qty 100

## 2023-12-22 MED ORDER — OXYCODONE HCL 5 MG PO TABS
10.0000 mg | ORAL_TABLET | ORAL | Status: DC | PRN
  Administered 2023-12-22 – 2023-12-23 (×8): 10 mg via ORAL
  Filled 2023-12-22 (×4): qty 2

## 2023-12-22 MED ORDER — GLUCOSE 4 G PO CHEW
CHEWABLE_TABLET | ORAL | Status: AC
  Administered 2023-12-22 (×2): 4 g
  Filled 2023-12-22: qty 1

## 2023-12-22 MED ORDER — HEPARIN SODIUM (PORCINE) 1000 UNIT/ML DIALYSIS: 2000.0000 [IU] | INTRAMUSCULAR | Status: DC | PRN

## 2023-12-22 MED ORDER — HEPARIN SODIUM (PORCINE) 1000 UNIT/ML DIALYSIS
1000.0000 [IU] | INTRAMUSCULAR | Status: DC | PRN
  Administered 2023-12-22 (×4): 1000 [IU] via INTRAVENOUS_CENTRAL
  Filled 2023-12-22 (×2): qty 1

## 2023-12-22 MED ORDER — HEPARIN SODIUM (PORCINE) 1000 UNIT/ML IJ SOLN
INTRAMUSCULAR | Status: AC
  Filled 2023-12-22: qty 2

## 2023-12-22 MED ORDER — DEXTROSE 5 % IV SOLN: INTRAVENOUS | Status: AC

## 2023-12-22 MED ORDER — MIDODRINE HCL 5 MG PO TABS
ORAL_TABLET | ORAL | Status: AC
  Filled 2023-12-22: qty 2

## 2023-12-22 MED ORDER — ALBUMIN HUMAN 25 % IV SOLN
INTRAVENOUS | Status: AC
  Filled 2023-12-22: qty 100

## 2023-12-22 MED ORDER — ALBUMIN HUMAN 25 % IV SOLN
12.5000 g | Freq: Once | INTRAVENOUS | Status: AC
  Administered 2023-12-22 (×2): 12.5 g via INTRAVENOUS

## 2023-12-22 MED ORDER — HEPARIN SODIUM (PORCINE) 1000 UNIT/ML DIALYSIS
1000.0000 [IU] | INTRAMUSCULAR | Status: DC | PRN
  Administered 2023-12-22 (×2): 1000 [IU]
  Filled 2023-12-22: qty 1

## 2023-12-22 MED ORDER — SODIUM CHLORIDE 0.9 % IV SOLN
2.0000 g | INTRAVENOUS | Status: DC
  Filled 2023-12-22: qty 12.5

## 2023-12-22 MED ORDER — LIDOCAINE HCL (PF) 1 % IJ SOLN: 5.0000 mL | INTRAMUSCULAR | Status: DC | PRN

## 2023-12-22 MED ORDER — ALTEPLASE 2 MG IJ SOLR: 2.0000 mg | Freq: Once | INTRAMUSCULAR | Status: DC | PRN

## 2023-12-22 NOTE — Progress Notes (Signed)
 [facility] Progress Note   Subjective:    Seen in HD this morning. BP is borderline low at baseline. Patient got his midodrine  a bit later this morning. Albumin  order given if needed. Complaints of bilateral pain in his hips. Next HD 8/13  Objective Vitals:   12/22/23 0417 12/22/23 0736 12/22/23 0802 12/22/23 0810  BP: 94/63 108/65 100/66 100/66  Pulse: 82 77 84 82  Resp: 16 19 20 17   Temp: 98.1 F (36.7 C) 98.6 F (37 C) 98 F (36.7 C)   TempSrc: Oral     SpO2: 94% 99% 96%   Weight:   42.5 kg   Height:       Physical Exam General: Awake, alert, NAD Heart: RRR; No murmurs Lungs: Clear anteriorly Abdomen: Soft and non-tender Extremities: B/l BKAs; No edema b/l stumps Dialysis Access: [facility]   Filed Weights   12/19/23 1344 12/19/23 1731 12/22/23 0802  Weight: 43.6 kg 42.2 kg 42.5 kg   No intake or output data in the 24 hours ending 12/22/23 0831  Additional Objective Labs: Basic Metabolic Panel: Recent Labs  Lab 12/17/23 0840 12/18/23 0635 12/19/23 0450  NA 132* 133* 136  K 4.1 4.3 4.7  CL 93* 97* 99  CO2 22 23 19*  GLUCOSE 161* 162* 142*  BUN 29* 19 30*  CREATININE 6.75* 5.01* 6.48*  CALCIUM  8.8* 9.0 9.1  PHOS 7.3*  --  5.6*   Liver Function Tests: Recent Labs  Lab 12/17/23 0840  ALBUMIN  2.3*   No results for input(s): LIPASE, AMYLASE in the last 168 hours. CBC: Recent Labs  Lab 12/17/23 0840 12/18/23 0635 12/19/23 0450  WBC 9.1 10.9* 8.1  HGB 9.0* 9.4* 9.7*  HCT 28.9* 29.7* 30.8*  MCV 101.0* 102.1* 100.3*  PLT 249 261 257     CBG: Recent Labs  Lab 12/21/23 0743 12/21/23 1201 12/21/23 1642 12/21/23 2146 12/22/23 0734  GLUCAP 164* 167* 116* 148* 159*   Iron  Studies: No results for input(s): IRON , TIBC, TRANSFERRIN, FERRITIN in the last 72 hours. Lab Results  Component Value Date   INR 1.1 12/15/2023   INR 1.2 10/08/2023   INR 1.2 02/28/2020    Medications:  ceFEPime  (MAXIPIME ) IV 1 g (12/21/23  2232)   vancomycin  Stopped (12/19/23 1723)    acetaminophen   1,000 mg Oral TID   bictegravir-emtricitabine -tenofovir  AF  1 tablet Oral Daily   Chlorhexidine  Gluconate Cloth  6 each Topical Q0600   gabapentin   100 mg Oral TID   heparin   5,000 Units Subcutaneous Q8H   heparin  sodium (porcine)  4,000 Units Intracatheter Once   insulin  aspart  0-5 Units Subcutaneous QHS   insulin  aspart  0-6 Units Subcutaneous TID WC   insulin  aspart  2 Units Subcutaneous TID WC   lidocaine   1 patch Transdermal Q24H   methocarbamol   500 mg Oral TID   sevelamer  carbonate  800 mg Oral TID WC   sodium chloride  flush  10-40 mL Intracatheter Q12H    Dialysis Orders: [facility] 3.5h  B400  45.5kg  TDC  Heparin  2000 Mircera 100 mcg - last dose 12/01/23 Calcitriol  1.0 mcg (on hold due to high Ca)   Home bp meds: Midodrine  10mg  tid prn for SBP < 100 Midodrine  10mg  pre HD mwf prn  Assessment/Plan: 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 Blood cx's sent, IV vanc/ cefepime  started. Discussed with ID and pharmacy: plan for IV Cefepime  2GM and Vancomycin  500mg  with HD X 8 weeks end date  02/09/24.  ESRD: on HD MWF. Next HD 8/13. BP: chronic low BP on midodrine  10 tid prn and 10mg  pre HD mwf as needed for SBP < 100. Can give albumin  during HD for low BP.  Volume: not a big volume gainer, limits wt gains. 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 HIV: controlled on Biktarvy  BMD: Corrected Ca 10.4 and phos high. Did not see phos binder on his OP med rec. Start sevelamer  TID WC. VDRA on hold due to high Ca.  Belvie Och, NP [facility] 12/22/2023,8:31 AM  LOS: 7 days

## 2023-12-22 NOTE — Progress Notes (Signed)
 Pharmacy Antibiotic Note  Alvin Daniels is a 53 y.o. male admitted on 12/14/2023 with osteomyelitis.  Pharmacy has been consulted for vancomycin  and cefepime  dosing. Patient received loading dose of vancomycin  1000mg  in ED as well as 2g dose of cefepime . WBC is WNL and patient is afebrile.   Vancomycin  pre-dialysis level of 21 this AM (within target range of 15-25 on current regimen). Stop date of 02/09/24 (8 weeks from start date of 8/4).   Plan: Continue IV vancomycin  500 mg every MWF with dialysis  Patient will receive IV cefepime  1g tonight then will change to 2g every MWF with dialysis for ease of administration   Monitor cultures and s/sx of infection.  Height: 4' 11 (149.9 cm) Weight: 42.5 kg (93 lb 11.1 oz) IBW/kg (Calculated) : 47.7  Temp (24hrs), Avg:98.1 F (36.7 C), Min:97.8 F (36.6 C), Max:98.6 F (37 C)  Recent Labs  Lab 12/17/23 0840 12/18/23 0635 12/19/23 0450 12/22/23 0500 12/22/23 0811  WBC 9.1 10.9* 8.1 7.3  --   CREATININE 6.75* 5.01* 6.48*  --  7.40*  VANCORANDOM  --   --   --   --  21    ESRD on HD, receives HD MWF outpatient.  No Known Allergies  Antimicrobials this admission: Cefepime  8/4 >>  Vancomycin  8/4 >>  Dose adjustments this admission: N/A  Microbiology results: Previous MRSA bacteremia May 2025.  8/4 2x Bcx: in process  Thank you for allowing pharmacy to be a part of this patient's care.  Rankin Sams, PharmD, BCPS, BCCCP Clinical Pharmacist

## 2023-12-22 NOTE — Progress Notes (Signed)
 PT Cancellation Note  Patient Details Name: Alvin Daniels MRN: [id number] DOB: 09/17/1970   Cancelled Treatment:    Reason Eval/Treat Not Completed: Patient at procedure or test/unavailable (PT will continue with attempts.)  Randine Essex, PT, MPT  Randine LULLA Essex 12/22/2023, 10:48 AM

## 2023-12-22 NOTE — Progress Notes (Addendum)
 Contacted [facility] to inquire if clinic has iv vanc and iv cefepime  which will be needed with HD at d/c. Will await clinic response. Will assist as needed.   Randine Mungo Dialysis Navigator [phone]  Addendum on 8/12 at 11:34 am: Clinic confirmed yesterday that they are able to provide iv abx at d/c.

## 2023-12-22 NOTE — Procedures (Signed)
 I was present at this dialysis session. I have reviewed the session itself and made appropriate changes.   Vital signs in last 24 hours:  Temp:  [97.8 F (36.6 C)-98.6 F (37 C)] 98 F (36.7 C) (08/11 0802) Pulse Rate:  [54-88] 82 (08/11 0810) Resp:  [16-20] 17 (08/11 0810) BP: (94-134)/(63-86) 100/66 (08/11 0810) SpO2:  [91 %-100 %] 96 % (08/11 0802) Weight:  [42.5 kg] 42.5 kg (08/11 0802) Weight change:  Filed Weights   12/19/23 1344 12/19/23 1731 12/22/23 0802  Weight: 43.6 kg 42.2 kg 42.5 kg    Recent Labs  Lab 12/19/23 0450  NA 136  K 4.7  CL 99  CO2 19*  GLUCOSE 142*  BUN 30*  CREATININE 6.48*  CALCIUM  9.1  PHOS 5.6*    Recent Labs  Lab 12/17/23 0840 12/18/23 0635 12/19/23 0450  WBC 9.1 10.9* 8.1  HGB 9.0* 9.4* 9.7*  HCT 28.9* 29.7* 30.8*  MCV 101.0* 102.1* 100.3*  PLT 249 261 257    Scheduled Meds:  acetaminophen   1,000 mg Oral TID   bictegravir-emtricitabine -tenofovir  AF  1 tablet Oral Daily   Chlorhexidine  Gluconate Cloth  6 each Topical Q0600   gabapentin   100 mg Oral TID   heparin   5,000 Units Subcutaneous Q8H   heparin  sodium (porcine)  4,000 Units Intracatheter Once   insulin  aspart  0-5 Units Subcutaneous QHS   insulin  aspart  0-6 Units Subcutaneous TID WC   insulin  aspart  2 Units Subcutaneous TID WC   lidocaine   1 patch Transdermal Q24H   methocarbamol   500 mg Oral TID   sevelamer  carbonate  800 mg Oral TID WC   sodium chloride  flush  10-40 mL Intracatheter Q12H   Continuous Infusions:  ceFEPime  (MAXIPIME ) IV 1 g (12/21/23 2232)   vancomycin  Stopped (12/19/23 1723)   PRN Meds:.alteplase , heparin , heparin , heparin , lidocaine  (PF), midodrine , oxyCODONE , sodium chloride  flush   Fairy DELENA Sellar,  MD 12/22/2023, 8:30 AM

## 2023-12-22 NOTE — Progress Notes (Signed)
 Hypoglycemic Event  CBG: 50  Treatment: 4 oz juice/soda and glucose tablet  Symptoms: None  Follow-up CBG: Time:2200 CBG Result:77  Possible Reasons for Event: Inadequate meal intake  Comments/MD notified:Dr. Franky Shu CHRISTELLA Ahle

## 2023-12-22 NOTE — Progress Notes (Signed)
   12/22/23 1117  Vitals  Temp 98.2 F (36.8 C)  Pulse Rate 89  Resp 10  BP 110/71  SpO2 100 %  O2 Device Room Air  Weight 42 kg  Type of Weight Post-Dialysis  Post Treatment  Dialyzer Clearance Lightly streaked  Liters Processed 72  Fluid Removed (mL) 500 mL  Tolerated HD Treatment Yes   Received patient in bed to unit.  Alert and oriented.  Informed consent signed and in chart.   TX duration: 3 Hours  Patient tolerated well.  Transported back to the room  Alert, without acute distress.  Hand-off given to patient's nurse.   Access used: Yes Access issues: No  Total UF removed: 500 ml goal obtained Medication(s) given: See MAR Post HD VS: See Above Grid Post HD weight: 42 kg   Zebedee DELENA [facility]

## 2023-12-22 NOTE — Plan of Care (Signed)

## 2023-12-22 NOTE — Progress Notes (Signed)
 Progress Note   Patient: Alvin Daniels FMW:[id number] DOB: Aug 02, 1970 DOA: 12/14/2023  DOS: the patient was seen and examined on 12/22/2023   Brief hospital course:  53 y.o. 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 coli and Streptococcus anginosus who presents with hip pain MRI was done that showed a right psoas abscess and L4-L5 discitis and osteomyelitis    Assessment and Plan:   L4-L5 osteomyelitis/discitis with right psoas abscess - Coverage empirically with cefepime  plus vancomycin .  MRI sacrum noting a 2.2 x 1 x 4.1 cm right psoas abscess.  Repeat MRI this morning noting discitis/osteomyelitis at L4-L5, right psoas abscess, and epidural phlegmon.  Also noting small paraspinal abscess anterior to L4-L5 as well as possible small left psoas abscess.  IR consulted for possible drainage however no safe access appreciated.  Not a surgical candidate.  Infectious disease consulted recommending conservative management with IV cefepime  plus vancomycin .  Patient to receive cefepime  plus vancomycin  with HD for total of 8 weeks, end date 02/09/2024.   ESRD on HD MWF - Nephrology following closely.  HD today.   Chronic hypotension - Likely exacerbated by infection and poor p.o. intake.  Midodrine  on board.  Nephrology following closely during HD.   Uncontrolled diabetes mellitus - Previous A1c 10 suggesting very poor control.  Currently on insulin  sliding scale.   HIV/chronic hep C - ID following.  Continue Biktarvy .   Severe protein calorie malnutrition - Protein supplementation encouraged.   Bilateral BKA with increased hip/leg pain - Contributing to physical debilitation and muscle weakness.  BL  prosthetics in room.  Patient complaining of worsening pain with ambulation.  Physical therapy following closely.  Increased patient's pain regimen to include oxycodone  10 mg every 4 hours, lidocaine  patch, gabapentin .  Received IV Toradol  x 1.  Patient said that he felt improved.  Would like him to attempt to participate in ambulation as it is the limiting factor in in his discharge home.  Would need to ensure patient is able to be ambulatory prior to discharge.   Goals of care - Disposition planning with patient, TOC, infectious disease, nephrology.  Infectious disease consulted recommending conservative management with IV cefepime  plus vancomycin .  Patient received cefepime  plus vancomycin  with HD for total of 8 weeks, end date 02/09/2024.  Repeat MRI recommended by infectious disease as well, to be scheduled in the outpatient setting.  Working closely with TOC to ensure this is all set up prior to discharge home.  SNF not an option given patient is not a [place]  citizen.   Subjective: Patient evaluated during hemodialysis this morning.  Still complaining of hip pain however has not tried to ambulate yesterday afternoon or today so far.  Would like to encourage him to ambulate with PT later today or tomorrow to get a true idea of how much pain he is in.  Limiting factor for his discharge at this time.  Physical Exam:  Vitals:   12/22/23 1030 12/22/23 1100 12/22/23 1115 12/22/23 1117  BP: 110/64 115/74 115/74 110/71  Pulse: 85 88 88 89  Resp: 15 12 10 10   Temp:    98.2 F (36.8 C)  TempSrc:      SpO2: 100% 100% 100% 100%  Weight:    42 kg  Height:  GENERAL:  Alert, pleasant, frail, cachectic, disheveled HEENT:  EOMI CARDIOVASCULAR:  RRR, no murmurs appreciated RESPIRATORY:  Clear to auscultation, no wheezing, rales, or rhonchi GASTROINTESTINAL:  Soft, nontender, nondistended EXTREMITIES: BL BKA NEURO:  No new focal deficits appreciated SKIN:  No rashes noted PSYCH:  Appropriate mood  and affect    Data Reviewed:  Imaging Studies: MR Lumbar Spine W Wo Contrast Result Date: 12/19/2023 EXAM: MRI LUMBAR SPINE 12/19/2023 09:16:00 AM TECHNIQUE: Multiplanar multisequence MRI of the lumbar spine was performed with and without the administration of intravenous contrast. COMPARISON: MRI lumbar spine 10/10/2023 and MRI sacrum 12/15/2023. CLINICAL HISTORY: Low back pain, infection suspected. FINDINGS: BONES AND ALIGNMENT: Straightening of the lumbar lordosis. Irregularity of the L4 inferior endplate with height loss of the L4 vertebral body, new since the prior lumbar spine MRI. Vertebral body heights otherwise maintained. SPINAL CORD: The conus terminates normally. SOFT TISSUES: Edema within the bilateral psoas muscles from the level of L3-4 extending into the pelvis to at least the superior aspect of the sacrum. Additional small focus of peripheral enhancing fluid within the left psoas muscle, best seen on series 8 image 21. Remarkable enhancing soft tissue along the posterior aspect of the L4 and L5 vertebral bodies measuring up to 0.4 cm in thickness, concerning for epidural phlegmon. L1-L2: No significant disc herniation. No spinal canal stenosis or neural foraminal narrowing. L2-L3: No significant disc herniation. No spinal canal stenosis or neural foraminal narrowing. L3-L4: No significant disc herniation. No spinal canal stenosis or neural foraminal narrowing. L4-L5: Signal abnormality within the L4-5 disc with adjacent bone marrow signal abnormality and enhancement, suggestive of discitis/osteomyelitis. Trace soft tissue swelling along the anterior aspect of the L4-5 disc with a focal 0.6 x 0.8 x 0.8 cm peripheral enhancing collection, concerning for abscess, seen on series 9 image 27 and series 8 image 15. Additional irregular peripheral enhancing collection within the left psoas, compatible with abscess, measuring 1.3 x 1.3 x 2.5 cm. Prominent disc bulge at L4-5, slightly eccentric to the  left, which indents the ventral thecal sac with lateral recess narrowing. Facet arthrosis at L4-5 with moderate spinal canal stenosis at this level. Disc bulge and facet arthrosis also contribute to moderate-to-severe right and moderate left foraminal stenosis at L4-5. Moderate bilateral facet effusions at L4-5 without significant surrounding enhancement. L5-S1: No significant disc herniation. No spinal canal stenosis or neural foraminal narrowing. IMPRESSION: 1. Discitis/osteomyelitis at L4-5 with associated paraspinal inflammatory changes, right psoas abscess, and epidural phlegmon. 2. Additional small paraspinal abscess anterior to the L4-5 disc. Possible small psoas abscess on the left. 3. Moderate spinal canal stenosis at L4-5 due to disc bulge and facet arthrosis, with moderate-to-severe right and moderate left foraminal stenosis. Electronically signed by: Donnice Mania MD 12/19/2023 11:41 AM EDT RP Workstation: [id number]   CT ABDOMEN PELVIS W CONTRAST Result Date: 12/16/2023 CLINICAL DATA:  Concern for right psoas abscess on recent sacral MRI. Right hip pain. EXAM: CT ABDOMEN AND PELVIS WITH CONTRAST TECHNIQUE: Multidetector CT imaging of the abdomen and pelvis was performed using the standard protocol following bolus administration of intravenous contrast. RADIATION DOSE REDUCTION: This exam was performed according to the departmental dose-optimization program which includes automated exposure control, adjustment of the mA and/or kV according to patient size and/or use of iterative reconstruction technique. CONTRAST:  75mL OMNIPAQUE  IOHEXOL  350 MG/ML SOLN COMPARISON:  Abdominopelvic CT 10/08/2023.  Sacral MRI 12/15/2023. FINDINGS: Technical note: Despite efforts by the technologist and patient, mild motion artifact is present on today's exam  and could not be eliminated. This reduces exam sensitivity and specificity. Examination was performed with the patient in the right lateral decubitus position. Lower  chest: Patchy scarring at both lung bases, similar to previous CT. Central line extends into the upper right atrium. There is coronary artery atherosclerosis. Hepatobiliary: The liver is normal in density without suspicious focal abnormality. Diffuse intra and extrahepatic biliary dilatation is again noted, similar to previous CT. The gallbladder is mildly distended without wall thickening or definite surrounding inflammation. Pancreas: Diffuse atrophy. No pancreatic ductal dilatation or surrounding inflammation identified. Spleen: Normal in size without focal abnormality. Adrenals/Urinary Tract: Both adrenal glands appear normal. Chronic renal cortical thinning and atrophy bilaterally with prominent renal vascular calcifications. No evidence of hydronephrosis or suspicious renal mass. The bladder appears unremarkable for its degree of distention. Stomach/Bowel: No enteric contrast administered. Mild generalized bowel wall thickening without significant distension or focal abnormality. Vascular/Lymphatic: There are no enlarged abdominal or pelvic lymph nodes. Severe aortic and branch vessel atherosclerosis without evidence of aneurysm or large vessel occlusion. Reproductive: The prostate gland and seminal vesicles appear unremarkable. Other: Mild generalized soft tissue edema consistent with anasarca. No ascites, focal extraluminal fluid collection or pneumoperitoneum. Musculoskeletal: As seen on recent pelvic MRI, there are findings of the L4-5 level suspicious for discitis and osteomyelitis. There are surrounding paraspinal inflammatory changes with heterogeneous enhancement in both psoas muscles. There is an asymmetric abscess on the right, measuring up to 3.9 cm in length on coronal image 52/6. Overall findings are similar to the recent MRI. The sacroiliac joints appear normal. Previous left femoral ORIF noted. IMPRESSION: 1. Findings of discitis and osteomyelitis at L4-5 with surrounding paraspinal inflammatory  changes and right psoas abscess, similar to recent MRI. Again, this could be further evaluated with lumbar MRI. 2. No other acute findings identified in the abdomen or pelvis. 3. Chronic intra and extrahepatic biliary dilatation, similar to previous CT. 4. Chronic renal cortical thinning and atrophy bilaterally. 5.  Aortic Atherosclerosis (ICD10-I70.0). Electronically Signed   By: Elsie Perone M.D.   On: 12/16/2023 13:04   MR SACRUM SI JOINTS W WO CONTRAST Result Date: 12/15/2023 CLINICAL DATA:  Septic arthritis suspected, hip, xray done Right hip pain. EXAM: MRI PELVIS WITHOUT AND WITH CONTRAST TECHNIQUE: Multiplanar multisequence MR imaging of the pelvis with attention to the sacroiliac joints was performed both before and after administration of intravenous contrast. CONTRAST:  5mL GADAVIST  GADOBUTROL  1 MMOL/ML IV SOLN COMPARISON:  Right hip radiographs 12/14/2023. MRI of the sacrum 10/15/2023 and MRI of the right hip 10/13/2023. FINDINGS: Bones/Joint/Cartilage No evidence of sacroiliitis, osteomyelitis or acute fracture within the visualized bony pelvis. However, there is new T2 hyperintensity and heterogeneous enhancement associated with the L4-5 disc and inferior endplate of L4, concerning for discitis and osteomyelitis. Paraspinal inflammatory changes are further described below. Mild degenerative changes at the hips and sacroiliac joints without significant joint effusions. Artifact from left femoral intramedullary nail noted. Ligaments No significant ligamentous abnormalities are identified. Muscles and Tendons Heterogeneous enhancement within the psoas muscles bilaterally with a peripherally enhancing fluid collection on the right measuring up to 2.2 x 1.2 x 4.1 cm, consistent with an abscess. No significant pelvic muscular abnormalities are identified. Soft tissue As above, findings suspicious for discitis and osteomyelitis at L4-5. There is heterogeneous paraspinal enhancement with extension into  the psoas musculature as described above. No abscess or other focal fluid collection identified within the pelvis. IMPRESSION: 1. Findings are consistent with discitis and osteomyelitis at L4-5 with associated  paraspinal inflammatory changes and right psoas abscess. Recommend dedicated lumbar MRI without and with contrast. 2. No evidence of sacroiliitis, osteomyelitis or septic arthritis within the pelvis. 3. Mild degenerative changes at the hips and sacroiliac joints. Electronically Signed   By: Elsie Perone M.D.   On: 12/15/2023 10:36   DG Hip Unilat W or Wo Pelvis 2-3 Views Right Result Date: 12/14/2023 CLINICAL DATA:  Right hip pain. EXAM: DG HIP (WITH OR WITHOUT PELVIS) 2-3V RIGHT COMPARISON:  None Available. FINDINGS: There is no evidence of acute hip fracture or dislocation. A radiopaque intramedullary rod and associated fixation screw are seen within the proximal left femoral shaft. There is no evidence of arthropathy or other focal bone abnormality. Marked severity vascular calcification is noted. IMPRESSION: 1. No acute findings. 2. Prior ORIF of the left femur. Electronically Signed   By: Suzen Dials M.D.   On: 12/14/2023 23:29    There are no new results to review at this time.  Previous records (including but not limited to H&P, progress notes, nursing notes, TOC management) were reviewed in assessment of this patient.  Labs: CBC: Recent Labs  Lab 12/17/23 0840 12/18/23 0635 12/19/23 0450 12/22/23 0500  WBC 9.1 10.9* 8.1 7.3  NEUTROABS  --   --   --  5.3  HGB 9.0* 9.4* 9.7* 8.9*  HCT 28.9* 29.7* 30.8* 28.3*  MCV 101.0* 102.1* 100.3* 100.7*  PLT 249 261 257 227   Basic Metabolic Panel: Recent Labs  Lab 12/17/23 0840 12/18/23 0635 12/19/23 0450 12/22/23 0811  NA 132* 133* 136 127*  K 4.1 4.3 4.7 4.0  CL 93* 97* 99 89*  CO2 22 23 19* 21*  GLUCOSE 161* 162* 142* 162*  BUN 29* 19 30* 35*  CREATININE 6.75* 5.01* 6.48* 7.40*  CALCIUM  8.8* 9.0 9.1 9.1  MG  --  2.0  2.2  --   PHOS 7.3*  --  5.6* 5.3*   Liver Function Tests: Recent Labs  Lab 12/17/23 0840 12/22/23 0811  ALBUMIN  2.3* 2.3*   CBG: Recent Labs  Lab 12/21/23 1201 12/21/23 1642 12/21/23 2146 12/22/23 0734 12/22/23 1157  GLUCAP 167* 116* 148* 159* 113*    Scheduled Meds:  acetaminophen   1,000 mg Oral TID   bictegravir-emtricitabine -tenofovir  AF  1 tablet Oral Daily   Chlorhexidine  Gluconate Cloth  6 each Topical Q0600   gabapentin   100 mg Oral TID   heparin   5,000 Units Subcutaneous Q8H   heparin  sodium (porcine)  4,000 Units Intracatheter Once   insulin  aspart  0-5 Units Subcutaneous QHS   insulin  aspart  0-6 Units Subcutaneous TID WC   insulin  aspart  2 Units Subcutaneous TID WC   lidocaine   1 patch Transdermal Q24H   methocarbamol   500 mg Oral TID   sevelamer  carbonate  800 mg Oral TID WC   sodium chloride  flush  10-40 mL Intracatheter Q12H   Continuous Infusions:  ceFEPime  (MAXIPIME ) IV 1 g (12/21/23 2232)   [START ON 12/24/2023] ceFEPime  (MAXIPIME ) IV     vancomycin  Stopped (12/22/23 1111)   PRN Meds:.midodrine , oxyCODONE , sodium chloride  flush  Family Communication: None at bedside  Disposition: Status is: Inpatient Remains inpatient appropriate because: Discitis     Time spent: 36 minutes  Length of inpatient stay: 7 days  Author: Carliss LELON Canales, DO 12/22/2023 12:46 PM  For on call review [email or web address].

## 2023-12-22 NOTE — Progress Notes (Signed)
 PT Cancellation Note  Patient Details Name: Taegen Delker MRN: [id number] DOB: 1971/05/09   Cancelled Treatment:    Reason Eval/Treat Not Completed: Pain limiting ability to participate (Attempted after pain medication. Patient adamantly refusing any mobility despite encouragement due to pain in the left leg. Will continue with attempts.)  Randine Essex, PT, MPT   Randine LULLA Essex 12/22/2023, 1:26 PM

## 2023-12-23 LAB — GLUCOSE, CAPILLARY
Glucose-Capillary: 113 mg/dL — ABNORMAL HIGH (ref 70–99)
Glucose-Capillary: 147 mg/dL — ABNORMAL HIGH (ref 70–99)
Glucose-Capillary: 161 mg/dL — ABNORMAL HIGH (ref 70–99)
Glucose-Capillary: 184 mg/dL — ABNORMAL HIGH (ref 70–99)
Glucose-Capillary: 88 mg/dL (ref 70–99)

## 2023-12-23 LAB — MAGNESIUM: Magnesium: 2.1 mg/dL (ref 1.7–2.4)

## 2023-12-23 LAB — BASIC METABOLIC PANEL WITH GFR
Anion gap: 13 (ref 5–15)
BUN: 19 mg/dL (ref 6–20)
CO2: 24 mmol/L (ref 22–32)
Calcium: 9.1 mg/dL (ref 8.9–10.3)
Chloride: 93 mmol/L — ABNORMAL LOW (ref 98–111)
Creatinine, Ser: 5.02 mg/dL — ABNORMAL HIGH (ref 0.61–1.24)
GFR, Estimated: 13 mL/min — ABNORMAL LOW (ref >60)
Glucose, Bld: 141 mg/dL — ABNORMAL HIGH (ref 70–99)
Potassium: 3.7 mmol/L (ref 3.5–5.1)
Sodium: 130 mmol/L — ABNORMAL LOW (ref 135–145)

## 2023-12-23 LAB — CBC
HCT: 29.3 % — ABNORMAL LOW (ref 39.0–52.0)
Hemoglobin: 9.4 g/dL — ABNORMAL LOW (ref 13.0–17.0)
MCH: 31.8 pg (ref 26.0–34.0)
MCHC: 32.1 g/dL (ref 30.0–36.0)
MCV: 99 fL (ref 80.0–100.0)
Platelets: 231 K/uL (ref 150–400)
RBC: 2.96 MIL/uL — ABNORMAL LOW (ref 4.22–5.81)
RDW: 14.6 % (ref 11.5–15.5)
WBC: 6.6 K/uL (ref 4.0–10.5)
nRBC: 0 % (ref 0.0–0.2)

## 2023-12-23 MED ORDER — CHLORHEXIDINE GLUCONATE CLOTH 2 % EX PADS
6.0000 | MEDICATED_PAD | Freq: Every day | CUTANEOUS | Status: DC
  Administered 2023-12-24 – 2023-12-26 (×4): 6 via TOPICAL

## 2023-12-23 MED ORDER — OXYCODONE HCL 5 MG PO TABS
15.0000 mg | ORAL_TABLET | Freq: Four times a day (QID) | ORAL | Status: DC | PRN
  Administered 2023-12-23 – 2024-01-01 (×23): 15 mg via ORAL
  Filled 2023-12-23 (×19): qty 3

## 2023-12-23 NOTE — Plan of Care (Signed)

## 2023-12-23 NOTE — Progress Notes (Signed)
 [facility] Progress Note   Subjective:    Seen in his room this morning. BP is borderline low at baseline. Complaints of bilateral pain in his hips related to his infection. He denies any chest pain or dyspnea. He reports that HD went well and they were able to yield 500 mL UF. Next HD 8/13  Objective Vitals:   12/22/23 1536 12/22/23 2133 12/23/23 0432 12/23/23 0754  BP: (!) 89/59 108/73 126/81 93/73  Pulse:  91 79 92  Resp: 19 18 18    Temp: 97.9 F (36.6 C) 98.9 F (37.2 C) 98 F (36.7 C) 98.3 F (36.8 C)  TempSrc:  Oral Oral   SpO2: 92% 100% 100% 98%  Weight:      Height:       Physical Exam General: Awake, alert, NAD Heart: RRR; No murmurs Lungs: Clear anteriorly Abdomen: Soft and non-tender Extremities: B/l BKAs; No edema b/l stumps Dialysis Access: [facility]   Filed Weights   12/19/23 1731 12/22/23 0802 12/22/23 1117  Weight: 42.2 kg 42.5 kg 42 kg    Intake/Output Summary (Last 24 hours) at 12/23/2023 1134 Last data filed at 12/23/2023 0400 Gross per 24 hour  Intake 196.24 ml  Output --  Net 196.24 ml    Additional Objective Labs: Basic Metabolic Panel: Recent Labs  Lab 12/17/23 0840 12/18/23 0635 12/19/23 0450 12/22/23 0811 12/23/23 0702  NA 132*   < > 136 127* 130*  K 4.1   < > 4.7 4.0 3.7  CL 93*   < > 99 89* 93*  CO2 22   < > 19* 21* 24  GLUCOSE 161*   < > 142* 162* 141*  BUN 29*   < > 30* 35* 19  CREATININE 6.75*   < > 6.48* 7.40* 5.02*  CALCIUM  8.8*   < > 9.1 9.1 9.1  PHOS 7.3*  --  5.6* 5.3*  --    < > = values in this interval not displayed.   Liver Function Tests: Recent Labs  Lab 12/17/23 0840 12/22/23 0811  ALBUMIN  2.3* 2.3*   No results for input(s): LIPASE, AMYLASE in the last 168 hours. CBC: Recent Labs  Lab 12/17/23 0840 12/18/23 0635 12/19/23 0450 12/22/23 0500 12/23/23 0702  WBC 9.1 10.9* 8.1 7.3 6.6  NEUTROABS  --   --   --  5.3  --   HGB 9.0* 9.4* 9.7* 8.9* 9.4*  HCT 28.9* 29.7* 30.8* 28.3*  29.3*  MCV 101.0* 102.1* 100.3* 100.7* 99.0  PLT 249 261 257 227 231     CBG: Recent Labs  Lab 12/22/23 1640 12/22/23 2055 12/22/23 2200 12/23/23 0056 12/23/23 0752  GLUCAP 166* 50* 77 88 113*   Medications:  [START ON 12/24/2023] ceFEPime  (MAXIPIME ) IV     dextrose  40 mL/hr at 12/22/23 2305   vancomycin  Stopped (12/22/23 1111)    acetaminophen   1,000 mg Oral TID   bictegravir-emtricitabine -tenofovir  AF  1 tablet Oral Daily   Chlorhexidine  Gluconate Cloth  6 each Topical Q0600   gabapentin   100 mg Oral TID   heparin   5,000 Units Subcutaneous Q8H   heparin  sodium (porcine)  4,000 Units Intracatheter Once   insulin  aspart  0-5 Units Subcutaneous QHS   insulin  aspart  0-6 Units Subcutaneous TID WC   lidocaine   1 patch Transdermal Q24H   methocarbamol   500 mg Oral TID   sevelamer  carbonate  800 mg Oral TID WC   sodium chloride  flush  10-40 mL Intracatheter Q12H  Dialysis Orders: [facility] 3.5h  B400  45.5kg  TDC  Heparin  2000 Mircera 100 mcg - last dose 12/01/23 Calcitriol  1.0 mcg (on hold due to high Ca)   Home bp meds: Midodrine  10mg  tid prn for SBP < 100 Midodrine  10mg  pre HD mwf prn  Assessment/Plan: 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 Blood cx's sent, IV vanc/ cefepime  started. Discussed with ID and pharmacy: plan for IV Cefepime  2GM and Vancomycin  500mg  with HD X 8 weeks end date 02/09/24.  ESRD: on HD MWF. Next HD 8/13. BP: chronic low BP on midodrine  10 tid prn and 10mg  pre HD mwf as needed for SBP < 100. Can give albumin  during HD for low BP.  Volume: not a big volume gainer, limits wt gains. 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 HIV: controlled on Biktarvy  BMD: Corrected Ca 10.3 and phos high. Did not see phos binder on his OP med rec. Start sevelamer  TID WC. VDRA on hold due to high Ca.  Belvie Och, NP [facility] 12/23/2023,11:34 AM  LOS: 8 days

## 2023-12-23 NOTE — Progress Notes (Signed)
 Progress Note   Patient: Alvin Daniels FMW:[id number] DOB: [birth year] DOA: 12/14/2023  DOS: the patient was seen and examined on 12/23/2023   Brief hospital course:  53 y.o. 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 coli and Streptococcus anginosus who presents with hip pain MRI was done that showed a right psoas abscess and L4-L5 discitis and osteomyelitis    Assessment and Plan:   L4-L5 osteomyelitis/discitis with right psoas abscess - Coverage empirically with cefepime  plus vancomycin .  MRI sacrum noting a 2.2 x 1 x 4.1 cm right psoas abscess.  Repeat MRI this morning noting discitis/osteomyelitis at L4-L5, right psoas abscess, and epidural phlegmon.  Also noting small paraspinal abscess anterior to L4-L5 as well as possible small left psoas abscess.  IR consulted for possible drainage however no safe access appreciated.  Not a surgical candidate.  Infectious disease consulted recommending conservative management with IV cefepime  plus vancomycin .  Patient to receive cefepime  plus vancomycin  with HD for total of 8 weeks, end date 02/09/2024.   ESRD on HD MWF - Nephrology following closely.  Next HD tomorrow 8/13.   Chronic hypotension - Likely exacerbated by infection and poor p.o. intake.  Midodrine  on board.  Nephrology following closely during HD.   Uncontrolled diabetes mellitus - Previous A1c 10 suggesting very poor control.  Currently on insulin  sliding scale.   HIV/chronic hep C - ID following.  Continue Biktarvy .   Severe protein calorie malnutrition - Protein supplementation encouraged.   Bilateral BKA with increased hip/leg pain - Contributing to physical debilitation and muscle  weakness.  BL prosthetics in room.  Patient complaining of worsening pain with ambulation.  Physical therapy following closely.  Increased patient's pain regimen to include oxycodone  10 mg every 4 hours, lidocaine  patch, gabapentin .  Would like him to attempt to participate in ambulation as it is the limiting factor in in his discharge home.  Would need to ensure patient is able to be ambulatory prior to discharge.  May consider increasing oxycodone  or other medications.  Pain control will be obviously very difficult given infection and location.   Goals of care - Disposition planning with patient, TOC, infectious disease, nephrology.  Infectious disease consulted recommending conservative management with IV cefepime  plus vancomycin .  Patient received cefepime  plus vancomycin  with HD for total of 8 weeks, end date 02/09/2024.  Repeat MRI recommended by infectious disease as well, to be scheduled in the outpatient setting.  Working closely with TOC to ensure this is all set up prior to discharge home.  SNF not an option given patient is not a [place]  citizen.   Subjective: Patient resting comfortably this morning.  Tab interpreter used at bedside.  Still complaining of hip pain however has not tried to ambulate yesterday afternoon or today so far.  Would like to encourage him to ambulate with his prosthetics with PT  today to get a true idea of how much pain he is in.  Limiting factor for his discharge at this time.  Physical Exam:  Vitals:   12/22/23 1536 12/22/23 2133 12/23/23 0432 12/23/23 0754  BP: (!) 89/59 108/73 126/81 93/73  Pulse:  91 79 92  Resp: 19 18 18    Temp: 97.9 F (36.6 C) 98.9 F (37.2 C) 98 F (36.7 C) 98.3 F (36.8 C)  TempSrc:  Oral Oral   SpO2: 92% 100% 100% 98%  Weight:      Height:        GENERAL:  Alert, pleasant, frail, cachectic, disheveled HEENT:  EOMI CARDIOVASCULAR:  RRR, no murmurs appreciated RESPIRATORY:  Clear to auscultation, no wheezing, rales, or  rhonchi GASTROINTESTINAL:  Soft, nontender, nondistended EXTREMITIES: BL BKA NEURO:  No new focal deficits appreciated SKIN:  No rashes noted PSYCH:  Appropriate mood and affect    Data Reviewed:  Imaging Studies: MR Lumbar Spine W Wo Contrast Result Date: 12/19/2023 EXAM: MRI LUMBAR SPINE 12/19/2023 09:16:00 AM TECHNIQUE: Multiplanar multisequence MRI of the lumbar spine was performed with and without the administration of intravenous contrast. COMPARISON: MRI lumbar spine 10/10/2023 and MRI sacrum 12/15/2023. CLINICAL HISTORY: Low back pain, infection suspected. FINDINGS: BONES AND ALIGNMENT: Straightening of the lumbar lordosis. Irregularity of the L4 inferior endplate with height loss of the L4 vertebral body, new since the prior lumbar spine MRI. Vertebral body heights otherwise maintained. SPINAL CORD: The conus terminates normally. SOFT TISSUES: Edema within the bilateral psoas muscles from the level of L3-4 extending into the pelvis to at least the superior aspect of the sacrum. Additional small focus of peripheral enhancing fluid within the left psoas muscle, best seen on series 8 image 21. Remarkable enhancing soft tissue along the posterior aspect of the L4 and L5 vertebral bodies measuring up to 0.4 cm in thickness, concerning for epidural phlegmon. L1-L2: No significant disc herniation. No spinal canal stenosis or neural foraminal narrowing. L2-L3: No significant disc herniation. No spinal canal stenosis or neural foraminal narrowing. L3-L4: No significant disc herniation. No spinal canal stenosis or neural foraminal narrowing. L4-L5: Signal abnormality within the L4-5 disc with adjacent bone marrow signal abnormality and enhancement, suggestive of discitis/osteomyelitis. Trace soft tissue swelling along the anterior aspect of the L4-5 disc with a focal 0.6 x 0.8 x 0.8 cm peripheral enhancing collection, concerning for abscess, seen on series 9 image 27 and series 8 image 15. Additional  irregular peripheral enhancing collection within the left psoas, compatible with abscess, measuring 1.3 x 1.3 x 2.5 cm. Prominent disc bulge at L4-5, slightly eccentric to the left, which indents the ventral thecal sac with lateral recess narrowing. Facet arthrosis at L4-5 with moderate spinal canal stenosis at this level. Disc bulge and facet arthrosis also contribute to moderate-to-severe right and moderate left foraminal stenosis at L4-5. Moderate bilateral facet effusions at L4-5 without significant surrounding enhancement. L5-S1: No significant disc herniation. No spinal canal stenosis or neural foraminal narrowing. IMPRESSION: 1. Discitis/osteomyelitis at L4-5 with associated paraspinal inflammatory changes, right psoas abscess, and epidural phlegmon. 2. Additional small paraspinal abscess anterior to the L4-5 disc. Possible small psoas abscess on the left. 3. Moderate spinal canal stenosis at L4-5 due to disc bulge and facet arthrosis, with moderate-to-severe right and moderate left foraminal stenosis. Electronically signed by: Donnice Mania MD 12/19/2023 11:41 AM EDT RP Workstation: [id number]   CT ABDOMEN PELVIS W CONTRAST Result Date: 12/16/2023 CLINICAL DATA:  Concern for right psoas abscess on recent sacral MRI. Right hip pain. EXAM: CT ABDOMEN AND PELVIS WITH CONTRAST TECHNIQUE: Multidetector CT imaging of the abdomen and pelvis was performed using the standard protocol following bolus administration of intravenous contrast. RADIATION DOSE REDUCTION: This exam was performed according to the departmental dose-optimization program which includes automated exposure control, adjustment of the mA and/or kV according to patient size and/or use of iterative reconstruction technique. CONTRAST:  75mL OMNIPAQUE  IOHEXOL  350 MG/ML SOLN COMPARISON:  Abdominopelvic  CT 10/08/2023.  Sacral MRI 12/15/2023. FINDINGS: Technical note: Despite efforts by the technologist and patient, mild motion artifact is present on  today's exam and could not be eliminated. This reduces exam sensitivity and specificity. Examination was performed with the patient in the right lateral decubitus position. Lower chest: Patchy scarring at both lung bases, similar to previous CT. Central line extends into the upper right atrium. There is coronary artery atherosclerosis. Hepatobiliary: The liver is normal in density without suspicious focal abnormality. Diffuse intra and extrahepatic biliary dilatation is again noted, similar to previous CT. The gallbladder is mildly distended without wall thickening or definite surrounding inflammation. Pancreas: Diffuse atrophy. No pancreatic ductal dilatation or surrounding inflammation identified. Spleen: Normal in size without focal abnormality. Adrenals/Urinary Tract: Both adrenal glands appear normal. Chronic renal cortical thinning and atrophy bilaterally with prominent renal vascular calcifications. No evidence of hydronephrosis or suspicious renal mass. The bladder appears unremarkable for its degree of distention. Stomach/Bowel: No enteric contrast administered. Mild generalized bowel wall thickening without significant distension or focal abnormality. Vascular/Lymphatic: There are no enlarged abdominal or pelvic lymph nodes. Severe aortic and branch vessel atherosclerosis without evidence of aneurysm or large vessel occlusion. Reproductive: The prostate gland and seminal vesicles appear unremarkable. Other: Mild generalized soft tissue edema consistent with anasarca. No ascites, focal extraluminal fluid collection or pneumoperitoneum. Musculoskeletal: As seen on recent pelvic MRI, there are findings of the L4-5 level suspicious for discitis and osteomyelitis. There are surrounding paraspinal inflammatory changes with heterogeneous enhancement in both psoas muscles. There is an asymmetric abscess on the right, measuring up to 3.9 cm in length on coronal image 52/6. Overall findings are similar to the recent  MRI. The sacroiliac joints appear normal. Previous left femoral ORIF noted. IMPRESSION: 1. Findings of discitis and osteomyelitis at L4-5 with surrounding paraspinal inflammatory changes and right psoas abscess, similar to recent MRI. Again, this could be further evaluated with lumbar MRI. 2. No other acute findings identified in the abdomen or pelvis. 3. Chronic intra and extrahepatic biliary dilatation, similar to previous CT. 4. Chronic renal cortical thinning and atrophy bilaterally. 5.  Aortic Atherosclerosis (ICD10-I70.0). Electronically Signed   By: Elsie Perone M.D.   On: 12/16/2023 13:04   MR SACRUM SI JOINTS W WO CONTRAST Result Date: 12/15/2023 CLINICAL DATA:  Septic arthritis suspected, hip, xray done Right hip pain. EXAM: MRI PELVIS WITHOUT AND WITH CONTRAST TECHNIQUE: Multiplanar multisequence MR imaging of the pelvis with attention to the sacroiliac joints was performed both before and after administration of intravenous contrast. CONTRAST:  5mL GADAVIST  GADOBUTROL  1 MMOL/ML IV SOLN COMPARISON:  Right hip radiographs 12/14/2023. MRI of the sacrum 10/15/2023 and MRI of the right hip 10/13/2023. FINDINGS: Bones/Joint/Cartilage No evidence of sacroiliitis, osteomyelitis or acute fracture within the visualized bony pelvis. However, there is new T2 hyperintensity and heterogeneous enhancement associated with the L4-5 disc and inferior endplate of L4, concerning for discitis and osteomyelitis. Paraspinal inflammatory changes are further described below. Mild degenerative changes at the hips and sacroiliac joints without significant joint effusions. Artifact from left femoral intramedullary nail noted. Ligaments No significant ligamentous abnormalities are identified. Muscles and Tendons Heterogeneous enhancement within the psoas muscles bilaterally with a peripherally enhancing fluid collection on the right measuring up to 2.2 x 1.2 x 4.1 cm, consistent with an abscess. No significant pelvic muscular  abnormalities are identified. Soft tissue As above, findings suspicious for discitis and osteomyelitis at L4-5. There is heterogeneous paraspinal enhancement with extension into the psoas musculature as described above.  No abscess or other focal fluid collection identified within the pelvis. IMPRESSION: 1. Findings are consistent with discitis and osteomyelitis at L4-5 with associated paraspinal inflammatory changes and right psoas abscess. Recommend dedicated lumbar MRI without and with contrast. 2. No evidence of sacroiliitis, osteomyelitis or septic arthritis within the pelvis. 3. Mild degenerative changes at the hips and sacroiliac joints. Electronically Signed   By: Elsie Perone M.D.   On: 12/15/2023 10:36   DG Hip Unilat W or Wo Pelvis 2-3 Views Right Result Date: 12/14/2023 CLINICAL DATA:  Right hip pain. EXAM: DG HIP (WITH OR WITHOUT PELVIS) 2-3V RIGHT COMPARISON:  None Available. FINDINGS: There is no evidence of acute hip fracture or dislocation. A radiopaque intramedullary rod and associated fixation screw are seen within the proximal left femoral shaft. There is no evidence of arthropathy or other focal bone abnormality. Marked severity vascular calcification is noted. IMPRESSION: 1. No acute findings. 2. Prior ORIF of the left femur. Electronically Signed   By: Suzen Dials M.D.   On: 12/14/2023 23:29    There are no new results to review at this time.  Previous records (including but not limited to H&P, progress notes, nursing notes, TOC management) were reviewed in assessment of this patient.  Labs: CBC: Recent Labs  Lab 12/17/23 0840 12/18/23 0635 12/19/23 0450 12/22/23 0500 12/23/23 0702  WBC 9.1 10.9* 8.1 7.3 6.6  NEUTROABS  --   --   --  5.3  --   HGB 9.0* 9.4* 9.7* 8.9* 9.4*  HCT 28.9* 29.7* 30.8* 28.3* 29.3*  MCV 101.0* 102.1* 100.3* 100.7* 99.0  PLT 249 261 257 227 231   Basic Metabolic Panel: Recent Labs  Lab 12/17/23 0840 12/18/23 0635 12/19/23 0450  12/22/23 0811 12/23/23 0702  NA 132* 133* 136 127* 130*  K 4.1 4.3 4.7 4.0 3.7  CL 93* 97* 99 89* 93*  CO2 22 23 19* 21* 24  GLUCOSE 161* 162* 142* 162* 141*  BUN 29* 19 30* 35* 19  CREATININE 6.75* 5.01* 6.48* 7.40* 5.02*  CALCIUM  8.8* 9.0 9.1 9.1 9.1  MG  --  2.0 2.2  --  2.1  PHOS 7.3*  --  5.6* 5.3*  --    Liver Function Tests: Recent Labs  Lab 12/17/23 0840 12/22/23 0811  ALBUMIN  2.3* 2.3*   CBG: Recent Labs  Lab 12/22/23 2055 12/22/23 2200 12/23/23 0056 12/23/23 0752 12/23/23 1142  GLUCAP 50* 77 88 113* 161*    Scheduled Meds:  acetaminophen   1,000 mg Oral TID   bictegravir-emtricitabine -tenofovir  AF  1 tablet Oral Daily   [START ON 12/24/2023] Chlorhexidine  Gluconate Cloth  6 each Topical Q0600   gabapentin   100 mg Oral TID   heparin   5,000 Units Subcutaneous Q8H   heparin  sodium (porcine)  4,000 Units Intracatheter Once   insulin  aspart  0-5 Units Subcutaneous QHS   insulin  aspart  0-6 Units Subcutaneous TID WC   lidocaine   1 patch Transdermal Q24H   methocarbamol   500 mg Oral TID   sevelamer  carbonate  800 mg Oral TID WC   sodium chloride  flush  10-40 mL Intracatheter Q12H   Continuous Infusions:  [START ON 12/24/2023] ceFEPime  (MAXIPIME ) IV     dextrose  40 mL/hr at 12/22/23 2305   vancomycin  Stopped (12/22/23 1111)   PRN Meds:.midodrine , oxyCODONE , sodium chloride  flush  Family Communication: None at bedside  Disposition: Status is: Inpatient Remains inpatient appropriate because: Discitis     Time spent: 38 minutes  Length of inpatient stay: 8  days  Author: Carliss LELON Canales, DO 12/23/2023 2:19 PM  For on call review [email or web address].

## 2023-12-23 NOTE — Progress Notes (Signed)
 Physical Therapy Treatment Patient Details Name: Alvin Daniels MRN: [id number] DOB: 02/24/1971 Today's Date: 12/23/2023   History of Present Illness Patient is a 53 y/o male admitted 12/14/23 with hip pain MRI showing R psoas abscess and L4-L5 discitis and osteomyelitis. 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  PMH includes ESRD on HD MWF, hepC, MRSA, HIV, bilat BKA, DMII.    PT Comments  Continuing work on functional mobility and activity tolerance;  Session focused on initial gait attemtps with bil prostheses, with promising results; Pt performed 3 bouts of amb with RW and chair follow for safety, sitting rest breaks in between bouts; distance limited by hip pain, mostly R hip, that starts at the hip and progresses up the spine;    In consideration of dc planning, he does have a wheelchair -- it stands to reason that if pain during his recovery becomes too much, he can use his wheelchair for mobility; will likely need medical fleeta transportation for HD (currently uses city bus); I'd also like to get a clearer picture of the amount of assistnce he has at home;   Placed OT order per protocol for a closer look at ADLs in consideration of getting home; Spoke with Ollen GAILS., HD unit Director about possibly getting [name] on round 2 for tomorrow, so PT and OT can work with him prior to HD   If plan is discharge home, recommend the following: A little help with walking and/or transfers;Help with stairs or ramp for entrance;Assist for transportation   Can travel by private vehicle        Equipment Recommendations  None recommended by PT    Recommendations for Other Services       Precautions / Restrictions Precautions Precautions: Fall Precaution/Restrictions Comments: B BKA; prosthetics in the room Restrictions Weight Bearing Restrictions Per Provider Order: No     Mobility  Bed Mobility Overal bed  mobility: Modified Independent                  Transfers Overall transfer level: Needs assistance Equipment used: Rolling walker (2 wheels) Transfers: Sit to/from Stand, Bed to chair/wheelchair/BSC Sit to Stand: Contact guard assist   Step pivot transfers: Contact guard assist       General transfer comment: Close guard for safety; Able to power up to stand and take steps with RW bed to chair    Ambulation/Gait Ambulation/Gait assistance: Contact guard assist, +2 safety/equipment Gait Distance (Feet): 10 Feet (x 3 bouts) Assistive device: Rolling walker (2 wheels) Gait Pattern/deviations: Decreased step length - right, Decreased step length - left, Trunk flexed Gait velocity: slwoed     General Gait Details: bilateral prostheses donned; short distance amb, with trunk flexed; 3 bouts with seated rest breaks   Stairs             Wheelchair Mobility     Tilt Bed    Modified Rankin (Stroke Patients Only)       Balance Overall balance assessment: Needs assistance   Sitting balance-Leahy Scale: Good       Standing balance-Leahy Scale: Poor (approaching Fair)                              Communication Communication Communication: No apparent difficulties Factors Affecting Communication: Non - [place] speaking, interpreter not available ([employer] intrpreters, [employer])  Cognition Arousal: Alert Behavior During Therapy: WFL for tasks assessed/performed  PT - Cognitive impairments: No apparent impairments                         Following commands: Intact      Cueing    Exercises      General Comments General comments (skin integrity, edema, etc.): no difficulty donning liners and prostheses today      Pertinent Vitals/Pain Pain Assessment Pain Assessment: Faces Faces Pain Scale: Hurts little more Pain Location: L hip, and it travels up his spine with incr time in standing Pain Descriptors / Indicators: Grimacing,  Guarding, Aching Pain Intervention(s): Premedicated before session, Monitored during session    Home Living                          Prior Function            PT Goals (current goals can now be found in the care plan section) Acute Rehab PT Goals Patient Stated Goal: to improve pain to walk PT Goal Formulation: With patient Time For Goal Achievement: 01/03/24 Potential to Achieve Goals: Good Progress towards PT goals: Progressing toward goals    Frequency    Min 2X/week      PT Plan      Co-evaluation              AM-PAC PT 6 Clicks Mobility   Outcome Measure  Help needed turning from your back to your side while in a flat bed without using bedrails?: None Help needed moving from lying on your back to sitting on the side of a flat bed without using bedrails?: None Help needed moving to and from a bed to a chair (including a wheelchair)?: A Little Help needed standing up from a chair using your arms (e.g., wheelchair or bedside chair)?: A Little Help needed to walk in hospital room?: A Little Help needed climbing 3-5 steps with a railing? : Total 6 Click Score: 18    End of Session Equipment Utilized During Treatment: Gait belt Activity Tolerance: Patient tolerated treatment well Patient left: in bed;with call bell/phone within reach;with bed alarm set (sitting EOB with prostheses on) Nurse Communication: Mobility status PT Visit Diagnosis: Pain;Other abnormalities of gait and mobility (R26.89) Pain - Right/Left: Right Pain - part of body: Hip (and up the spine)     Time: 1206-1248 PT Time Calculation (min) (ACUTE ONLY): 42 min  Charges:    $Gait Training: 23-37 mins $Therapeutic Activity: 8-22 mins PT General Charges $$ ACUTE PT VISIT: 1 Visit                    Silvano Currier, PT  Acute Rehabilitation Services Office [phone] Secure Chat welcomed    Silvano VEAR Currier 12/23/2023, 3:43 PM

## 2023-12-24 LAB — GLUCOSE, CAPILLARY
Glucose-Capillary: 154 mg/dL — ABNORMAL HIGH (ref 70–99)
Glucose-Capillary: 170 mg/dL — ABNORMAL HIGH (ref 70–99)
Glucose-Capillary: 142 mg/dL — ABNORMAL HIGH (ref 70–99)
Glucose-Capillary: 213 mg/dL — ABNORMAL HIGH (ref 70–99)

## 2023-12-24 LAB — CBC
HCT: 27.6 % — ABNORMAL LOW (ref 39.0–52.0)
Hemoglobin: 8.9 g/dL — ABNORMAL LOW (ref 13.0–17.0)
MCH: 31.7 pg (ref 26.0–34.0)
MCHC: 32.2 g/dL (ref 30.0–36.0)
MCV: 98.2 fL (ref 80.0–100.0)
Platelets: 226 K/uL (ref 150–400)
RBC: 2.81 MIL/uL — ABNORMAL LOW (ref 4.22–5.81)
RDW: 14.6 % (ref 11.5–15.5)
WBC: 6.5 K/uL (ref 4.0–10.5)
nRBC: 0 % (ref 0.0–0.2)

## 2023-12-24 LAB — BASIC METABOLIC PANEL WITH GFR
Anion gap: 15 (ref 5–15)
BUN: 27 mg/dL — ABNORMAL HIGH (ref 6–20)
CO2: 24 mmol/L (ref 22–32)
Calcium: 9.3 mg/dL (ref 8.9–10.3)
Chloride: 91 mmol/L — ABNORMAL LOW (ref 98–111)
Creatinine, Ser: 6.35 mg/dL — ABNORMAL HIGH (ref 0.61–1.24)
GFR, Estimated: 10 mL/min — ABNORMAL LOW (ref >60)
Glucose, Bld: 176 mg/dL — ABNORMAL HIGH (ref 70–99)
Potassium: 3.9 mmol/L (ref 3.5–5.1)
Sodium: 130 mmol/L — ABNORMAL LOW (ref 135–145)

## 2023-12-24 LAB — MAGNESIUM: Magnesium: 2.1 mg/dL (ref 1.7–2.4)

## 2023-12-24 LAB — PHOSPHORUS: Phosphorus: 4.9 mg/dL — ABNORMAL HIGH (ref 2.5–4.6)

## 2023-12-24 MED ORDER — MORPHINE SULFATE ER 15 MG PO TBCR
15.0000 mg | EXTENDED_RELEASE_TABLET | Freq: Two times a day (BID) | ORAL | Status: DC
  Administered 2023-12-24 – 2023-12-31 (×14): 15 mg via ORAL
  Filled 2023-12-24 (×14): qty 1

## 2023-12-24 MED ORDER — IPRATROPIUM-ALBUTEROL 0.5-2.5 (3) MG/3ML IN SOLN: 3.0000 mL | RESPIRATORY_TRACT | Status: DC | PRN

## 2023-12-24 MED ORDER — VANCOMYCIN HCL 500 MG/100ML IV SOLN: 500.0000 mg | Freq: Once | INTRAVENOUS | Status: DC

## 2023-12-24 MED ORDER — OXYCODONE HCL 5 MG PO TABS
15.0000 mg | ORAL_TABLET | ORAL | Status: DC
  Administered 2023-12-26 – 2024-01-01 (×6): 15 mg via ORAL
  Filled 2023-12-24 (×6): qty 3

## 2023-12-24 MED ORDER — HYDRALAZINE HCL 20 MG/ML IJ SOLN: 10.0000 mg | INTRAMUSCULAR | Status: DC | PRN

## 2023-12-24 MED ORDER — GLUCAGON HCL RDNA (DIAGNOSTIC) 1 MG IJ SOLR
1.0000 mg | INTRAMUSCULAR | Status: AC | PRN
  Administered 2024-02-12: 1 mg via INTRAVENOUS
  Filled 2023-12-24: qty 1

## 2023-12-24 MED ORDER — SODIUM CHLORIDE 0.9 % IV SOLN: 2.0000 g | Freq: Once | INTRAVENOUS | Status: DC

## 2023-12-24 MED ORDER — VANCOMYCIN HCL IN DEXTROSE 1-5 GM/200ML-% IV SOLN: 1000.0000 mg | Freq: Once | INTRAVENOUS | Status: DC

## 2023-12-24 MED ORDER — METOPROLOL TARTRATE 5 MG/5ML IV SOLN: 5.0000 mg | INTRAVENOUS | Status: DC | PRN

## 2023-12-24 MED ORDER — ONDANSETRON HCL 4 MG/2ML IJ SOLN
4.0000 mg | Freq: Four times a day (QID) | INTRAMUSCULAR | Status: DC | PRN
  Administered 2024-01-27 – 2024-02-12 (×2): 4 mg via INTRAVENOUS
  Filled 2023-12-24 (×2): qty 2

## 2023-12-24 NOTE — TOC Progression Note (Signed)
 Transition of Care [facility] - Progression Note    Patient Details  Name: Alvin Daniels MRN: [id number] Date of Birth: 11-25-1970  [facility] CM/SW Contact  Lauraine FORBES Saa, LCSWA Phone Number: 12/24/2023, 11:55 AM  Clinical Narrative:     11:55 AM TOC consulted [facility] admissions for potential candidacy. Per [facility] admissions, patient is a potential candidate for the [facility] program pending pain management. TOC will continue to follow and be available to assist.  Expected Discharge Plan: Home/Self Care Barriers to Discharge: Continued Medical Work up               Expected Discharge Plan and Services       Living arrangements for the past 2 months: Single Family Home                                       Social Drivers of Health (SDOH) Interventions SDOH Screenings   Food Insecurity: No Food Insecurity (12/15/2023)  Housing: Low Risk  (12/15/2023)  Transportation Needs: No Transportation Needs (12/15/2023)  Utilities: Not At Risk (12/15/2023)  Alcohol  Screen: Low Risk  (07/17/2023)  Depression (PHQ2-9): Low Risk  (07/17/2023)  Financial Resource Strain: Low Risk  (01/29/2023)  Physical Activity: Inactive (01/29/2023)  Social Connections: Moderately Isolated (10/08/2023)  Stress: No Stress Concern Present (01/29/2023)  Tobacco Use: Low Risk  (12/14/2023)  Health Literacy: Adequate Health Literacy (01/29/2023)    Readmission Risk Interventions     No data to display

## 2023-12-24 NOTE — Plan of Care (Signed)

## 2023-12-24 NOTE — Evaluation (Signed)
 Occupational Therapy Evaluation Patient Details Name: Alvin Daniels MRN: [id number] DOB: 05/20/1970 Today's Date: 12/24/2023   History of Present Illness   Patient is a 53 y/o male admitted 12/14/23 with hip pain MRI showing R psoas abscess and L4-L5 discitis and osteomyelitis. 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  PMH includes ESRD on HD MWF, hepC, MRSA, HIV, bilat BKA, DMII.     Clinical Impressions Pt admitted for above, PTA pt lived alone and reports being ind with mobility using RW and bilat parosteitises, ind with Adls. Pt currently limited by L BKA pain, declined mobility efforts secondary to pain severity but did demonstrate his ability to don prosthetic sleeves. Pt also c/o pain in BUEs with ROM, noted to have pain in R serratus and L shoulder joint, which occurred from falls years ago. Pt may benefit from isometric strengthening and stability exercises for UE deficits. Patient would benefit from post acute Home OT services to help maximize functional independence in natural environment      If plan is discharge home, recommend the following:   Assistance with cooking/housework;A little help with bathing/dressing/bathroom     Functional Status Assessment   Patient has had a recent decline in their functional status and demonstrates the ability to make significant improvements in function in a reasonable and predictable amount of time.     Equipment Recommendations   None recommended by OT (pt declined need for additional DME recs)     Recommendations for Other Services         Precautions/Restrictions   Precautions Precautions: Fall Precaution/Restrictions Comments: B BKA; prosthetics in the room Restrictions Weight Bearing Restrictions Per Provider Order: No     Mobility Bed Mobility Overal bed mobility: Modified Independent             General bed mobility  comments: Pt able to progress to EOB without physical assist, rail support.    Transfers                   General transfer comment: Pt declined.      Balance Overall balance assessment: Needs assistance   Sitting balance-Leahy Scale: Good                                     ADL either performed or assessed with clinical judgement   ADL Overall ADL's : Needs assistance/impaired Eating/Feeding: Bed level;Set up   Grooming: Bed level;Set up   Upper Body Bathing: Bed level;Set up   Lower Body Bathing: Set up;Bed level   Upper Body Dressing : Bed level;Set up   Lower Body Dressing: Bed level;Minimal assistance     Toilet Transfer Details (indicate cue type and reason): pt declined transfers           General ADL Comments: Pt donned prosthetic sleeves with setup sitting EOB, declined fully putting on prosthetic.     Vision         Perception         Praxis         Pertinent Vitals/Pain Pain Assessment Pain Assessment: 0-10 Pain Score: 10-Worst pain ever Pain Location: per pt report, initially had trouble understanding the number scale Pain Descriptors / Indicators: Grimacing, Guarding, Aching Pain Intervention(s): Monitored during session, Repositioned, Premedicated before session, Limited activity within patient's tolerance     Extremity/Trunk Assessment Upper Extremity Assessment Upper Extremity Assessment:  Overall [facility] for tasks assessed;RUE deficits/detail;LUE deficits/detail RUE Deficits / Details: pain in seratus muscles vs ribs with AROM LUE Deficits / Details: ROM WFL but pain at glenoid fossa with AROM. missing middle finger LUE: Shoulder pain with ROM   Lower Extremity Assessment LLE Deficits / Details: B BKA, AROM WFL, strength at least 4/5, denies numbness   Cervical / Trunk Assessment Cervical / Trunk Assessment: Normal   Communication Communication Communication: No apparent difficulties Factors Affecting  Communication: Non - English speaking, interpreter not available (in person interpreter used)   Cognition Arousal: Alert Behavior During Therapy: [facility] for tasks assessed/performed Cognition: No apparent impairments                               Following commands: Intact       Cueing  General Comments      went through list of AE that may benefit pt but he did not want any of them.   Exercises     Shoulder Instructions      Home Living Family/patient expects to be discharged to:: Private residence Living Arrangements: Alone Available Help at Discharge: Friend(s);Available PRN/intermittently Type of Home: Apartment Home Access: Level entry     Home Layout: One level     Bathroom Shower/Tub: Chief Strategy Officer: Standard     Home Equipment: Agricultural consultant (2 wheels);Shower seat;Wheelchair - manual          Prior Functioning/Environment Prior Level of Function : Independent/Modified Independent                    OT Problem List: Pain;Impaired balance (sitting and/or standing)   OT Treatment/Interventions: Self-care/ADL training;Therapeutic exercise;Balance training;Therapeutic activities;DME and/or AE instruction      OT Goals(Current goals can be found in the care plan section)   Acute Rehab OT Goals Patient Stated Goal: To have pain go down then return home OT Goal Formulation: With patient Time For Goal Achievement: 01/07/24 Potential to Achieve Goals: Good ADL Goals Pt Will Perform Lower Body Dressing: with modified independence;sit to/from stand Pt Will Transfer to Toilet: with modified independence;ambulating Pt Will Perform Tub/Shower Transfer: Shower transfer;with modified independence;shower seat;ambulating Pt/caregiver will Perform Home Exercise Program: With theraband;Right Upper extremity;Left upper extremity;Independently;With written HEP provided (For L shoulder stability and R sided strengthening.)   OT  Frequency:  Min 2X/week    Co-evaluation              AM-PAC OT 6 Clicks Daily Activity     Outcome Measure Help from another person eating meals?: A Little Help from another person taking care of personal grooming?: A Little Help from another person toileting, which includes using toliet, bedpan, or urinal?: A Little Help from another person bathing (including washing, rinsing, drying)?: A Little Help from another person to put on and taking off regular upper body clothing?: A Little Help from another person to put on and taking off regular lower body clothing?: A Little 6 Click Score: 18   End of Session Nurse Communication: Mobility status  Activity Tolerance: Patient limited by pain Patient left: in bed;with call bell/phone within reach;with bed alarm set  OT Visit Diagnosis: Pain;Other abnormalities of gait and mobility (R26.89);Unsteadiness on feet (R26.81) Pain - Right/Left: Left Pain - part of body:  (BKA)                Time: 9062-8966 OT Time Calculation (min): 56 min  Charges:  OT General Charges $OT Visit: 1 Visit OT Evaluation $OT Eval Low Complexity: 1 Low OT Treatments $Self Care/Home Management : 8-22 mins  12/24/2023  AB, OTR/L  Acute Rehabilitation Services  Office: [phone]   [name] 12/24/2023, 11:47 AM

## 2023-12-24 NOTE — Progress Notes (Signed)
 PROGRESS NOTE    Alvin Daniels  FMW:[id number] DOB: 01-23-1971 DOA: 12/14/2023 PCP: Pcp, No    Brief Narrative:   53 y.o. 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 coli and Streptococcus anginosus who presents with hip pain MRI was done that showed a right psoas abscess and L4-L5 discitis and osteomyelitis.  Evaluated by IR but no safe access and not a surgical candidate.  ID is recommending total 8 weeks of vancomycin  and cefepime  with HD, EOT 02/09/2024    Assessment & Plan:  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)   Severe protein-calorie malnutrition ([name])   Normocytic anemia   ESRD on hemodialysis ([name])   S/P bilateral BKA (below knee amputation) ([name])   Chronic hepatitis C without hepatic coma ([name])   HIV (human immunodeficiency virus infection) ([name])   Hx MRSA infection   Osteomyelitis of lumbar spine ([name])   Epidural abscess   Paraspinal abscess ([name])   Pyogenic inflammation of bone ([name])     L4-L5 osteomyelitis/discitis with right psoas abscess - Coverage empirically with cefepime  plus vancomycin .  MRI sacrum noting a 2.2 x 1 x 4.1 cm right psoas abscess.  Repeat MRI this morning noting discitis/osteomyelitis at L4-L5, right psoas abscess, and epidural phlegmon.  Also noting small paraspinal abscess anterior to L4-L5 as well as possible small left psoas abscess.  IR consulted for possible drainage however no safe access appreciated.  Not a surgical candidate.  Infectious disease consulted recommending conservative management with IV cefepime  plus vancomycin .  Patient to receive cefepime  plus vancomycin  with  HD for total of 8 weeks, end date 02/09/2024.   ESRD on HD MWF - Nephrology following closely.     Chronic hypotension - Likely exacerbated by infection and poor p.o. intake.  Midodrine  on board.  Nephrology following closely during HD.   Uncontrolled diabetes mellitus - Previous A1c 10 suggesting very poor control.  Currently on insulin  sliding scale.   HIV/chronic hep C - ID following.  Continue Biktarvy .   Severe protein calorie malnutrition - Protein supplementation encouraged.   Bilateral BKA with increased hip/leg pain - Contributing to physical debilitation and muscle weakness.  BL prosthetics in room.  Patient complaining of worsening pain with ambulation.  Physical therapy following closely.  Increased patient's pain regimen to include oxycodone  10 mg every 4 hours, lidocaine  patch, gabapentin .  Would like him to attempt to participate in ambulation as it is the limiting factor in in his discharge home.  Would need to ensure patient is able to be ambulatory prior to discharge.  May consider increasing oxycodone  or other medications.  Pain control will be obviously very difficult given infection and location.   Goals of care - Disposition planning with patient, TOC, infectious disease, nephrology.  Infectious disease consulted recommending conservative management with IV cefepime  plus vancomycin .  Patient received cefepime  plus vancomycin  with HD for total of 8 weeks, end date 02/09/2024.  Repeat MRI recommended by infectious disease as well, to be scheduled in the outpatient setting.  Working closely with TOC to ensure this is all set up prior to discharge home.  SNF not an option given patient is not a [place]  citizen.  DVT prophylaxis: heparin  injection 5,000 Units Start: 12/15/23 1400  Code Status: Full Code Family Communication:   Status is: Inpatient Remains inpatient appropriate because: Continue hospital stay for pain control    Subjective:  In person Spanish interpreter  used Physical therapy staff at bedside during my evaluation Patient continues to report of left-sided hip and stump pain.  Very resistant to using wheelchair.  Despite getting pain medication at this time does not necessarily want to use wheelchair as he is afraid that this will induce pain.  Examination:  General exam: Appears calm and comfortable  Respiratory system: Clear to auscultation. Respiratory effort normal. Cardiovascular system: S1 & S2 heard, RRR. No JVD, murmurs, rubs, gallops or clicks. No pedal edema. Gastrointestinal system: Abdomen is nondistended, soft and nontender. No organomegaly or masses felt. Normal bowel sounds heard. Central nervous system: Alert and oriented. No focal neurological deficits. Extremities: Symmetric 5 x 5 power. Skin: No rashes, lesions or ulcers Psychiatry: Judgement and insight appear normal. Mood & affect appropriate.                Diet Orders (From admission, onward)     Start     Ordered   12/16/23 1646  Diet renal/carb modified with fluid restriction Diet-HS Snack? Nothing; Fluid restriction: 1200 mL Fluid; Room service appropriate? Yes; Fluid consistency: Thin  Diet effective now       Question Answer Comment  Diet-HS Snack? Nothing   Fluid restriction: 1200 mL Fluid   Room service appropriate? Yes   Fluid consistency: Thin      12/16/23 1646            Objective: Vitals:   12/23/23 1528 12/23/23 1954 12/24/23 0353 12/24/23 0529  BP: 105/76 128/73 106/69 (!) 95/56  Pulse: 76 88 86 88  Resp:  18 18 18   Temp: 98.3 F (36.8 C) 98 F (36.7 C) 98.2 F (36.8 C) 98.3 F (36.8 C)  TempSrc:  Oral Oral Oral  SpO2: 93% 95% 98% 98%  Weight:      Height:       No intake or output data in the 24 hours ending 12/24/23 1240 Filed Weights   12/19/23 1731 12/22/23 0802 12/22/23 1117  Weight: 42.2 kg 42.5 kg 42 kg    Scheduled Meds:  acetaminophen   1,000 mg Oral TID   bictegravir-emtricitabine -tenofovir  AF  1 tablet  Oral Daily   Chlorhexidine  Gluconate Cloth  6 each Topical Q0600   gabapentin   100 mg Oral TID   heparin   5,000 Units Subcutaneous Q8H   heparin  sodium (porcine)  4,000 Units Intracatheter Once   insulin  aspart  0-5 Units Subcutaneous QHS   insulin  aspart  0-6 Units Subcutaneous TID WC   lidocaine   1 patch Transdermal Q24H   methocarbamol   500 mg Oral TID   morphine   15 mg Oral Q12H   sevelamer  carbonate  800 mg Oral TID WC   sodium chloride  flush  10-40 mL Intracatheter Q12H   Continuous Infusions:  ceFEPime  (MAXIPIME ) IV     vancomycin  Stopped (12/22/23 1111)    Nutritional status     Body mass index is 18.7 kg/m.  Data Reviewed:   CBC: Recent Labs  Lab 12/18/23 0635 12/19/23 0450 12/22/23 0500 12/23/23 0702 12/24/23 0513  WBC 10.9* 8.1 7.3 6.6 6.5  NEUTROABS  --   --  5.3  --   --   HGB 9.4* 9.7* 8.9* 9.4* 8.9*  HCT 29.7* 30.8* 28.3* 29.3* 27.6*  MCV 102.1* 100.3* 100.7* 99.0 98.2  PLT 261 257 227 231 226  Basic Metabolic Panel: Recent Labs  Lab 12/18/23 0635 12/19/23 0450 12/22/23 0811 12/23/23 0702 12/24/23 0513  NA 133* 136 127* 130* 130*  K 4.3 4.7 4.0 3.7 3.9  CL 97* 99 89* 93* 91*  CO2 23 19* 21* 24 24  GLUCOSE 162* 142* 162* 141* 176*  BUN 19 30* 35* 19 27*  CREATININE 5.01* 6.48* 7.40* 5.02* 6.35*  CALCIUM  9.0 9.1 9.1 9.1 9.3  MG 2.0 2.2  --  2.1 2.1  PHOS  --  5.6* 5.3*  --  4.9*   GFR: Estimated Creatinine Clearance: 8 mL/min (A) (by C-G formula based on SCr of 6.35 mg/dL (H)). Liver Function Tests: Recent Labs  Lab 12/22/23 0811  ALBUMIN  2.3*   No results for input(s): LIPASE, AMYLASE in the last 168 hours. No results for input(s): AMMONIA in the last 168 hours. Coagulation Profile: No results for input(s): INR, PROTIME in the last 168 hours. Cardiac Enzymes: No results for input(s): CKTOTAL, CKMB, CKMBINDEX, TROPONINI in the last 168 hours. BNP (last 3 results) No results for input(s): PROBNP in the last  8760 hours. HbA1C: No results for input(s): HGBA1C in the last 72 hours. CBG: Recent Labs  Lab 12/23/23 0752 12/23/23 1142 12/23/23 1528 12/23/23 1953 12/24/23 0838  GLUCAP 113* 161* 147* 184* 154*   Lipid Profile: No results for input(s): CHOL, HDL, LDLCALC, TRIG, CHOLHDL, LDLDIRECT in the last 72 hours. Thyroid Function Tests: No results for input(s): TSH, T4TOTAL, FREET4, T3FREE, THYROIDAB in the last 72 hours. Anemia Panel: No results for input(s): VITAMINB12, FOLATE, FERRITIN, TIBC, IRON , RETICCTPCT in the last 72 hours. Sepsis Labs: No results for input(s): PROCALCITON, LATICACIDVEN in the last 168 hours.  Recent Results (from the past 240 hours)  Blood Culture (routine x 2)     Status: None   Collection Time: 12/15/23  6:08 AM   Specimen: BLOOD RIGHT ARM  Result Value Ref Range Status   Specimen Description BLOOD RIGHT ARM  Final   Special Requests   Final    BOTTLES DRAWN AEROBIC AND ANAEROBIC Blood Culture adequate volume   Culture   Final    NO GROWTH 5 DAYS Performed at [facility], [address], [place], [place] [address]    Report Status 12/20/2023 FINAL  Final  Blood Culture (routine x 2)     Status: None   Collection Time: 12/15/23  6:13 AM   Specimen: BLOOD RIGHT ARM  Result Value Ref Range Status   Specimen Description BLOOD RIGHT ARM  Final   Special Requests   Final    BOTTLES DRAWN AEROBIC AND ANAEROBIC Blood Culture adequate volume   Culture   Final    NO GROWTH 5 DAYS Performed at [facility], [address] [place], [place] [address]    Report Status 12/20/2023 FINAL  Final         Radiology Studies: No results found.         LOS: 9 days   Time spent= 35 mins    Burgess JAYSON Dare, MD [facility]  If 7PM-7AM, please contact night-coverage  12/24/2023, 12:40 PM

## 2023-12-24 NOTE — Hospital Course (Addendum)
 Brief Narrative:   53 y.o. 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 coli and Streptococcus anginosus who presents with hip pain MRI was done that showed a right psoas abscess and L4-L5 discitis and osteomyelitis.  Evaluated by IR but no safe access and not a surgical candidate.  ID is recommending total 8 weeks of vancomycin  and cefepime  with HD, EOT 02/09/2024 Working on pain control and safe disposition.  Assessment & Plan:  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)   Severe protein-calorie malnutrition ([name])   Normocytic anemia   ESRD on hemodialysis ([name])   S/P bilateral BKA (below knee amputation) ([name])   Chronic hepatitis C without hepatic coma ([name])   HIV (human immunodeficiency virus infection) ([name])   Hx MRSA infection   Osteomyelitis of lumbar spine ([name])   Epidural abscess   Paraspinal abscess ([name])   Pyogenic inflammation of bone ([name])     L4-L5 osteomyelitis/discitis with right psoas abscess -MRI sacrum noting a 2.2 x 1 x 4.1 cm right psoas abscess.  Repeat MRI this morning noting discitis/osteomyelitis at L4-L5, right psoas abscess, and epidural phlegmon.  Also noting small paraspinal abscess anterior to L4-L5 as well as possible small left psoas abscess.  IR consulted for possible drainage however no safe access appreciated.  Not a surgical candidate.  -ID recommendations for patient to receive cefepime  and vancomycin  with HD for total of 2 weeks, EOT 02/09/2024   ESRD on HD MWF - Nephrology following closely. Afternoon HD has been requested.    Chronic hypotension -Renal following. Increased Midodrine .     Uncontrolled diabetes mellitus - Previous A1c 10 suggesting very poor control.  Currently on insulin  sliding scale.   HIV/chronic hep C - ID following.  Continue Biktarvy .   Severe protein calorie malnutrition - Protein supplementation encouraged.   Bilateral BKA with increased hip/leg pain - Contributing to physical debilitation and muscle weakness.  BL prosthetics in room.  Patient complaining of worsening pain with ambulation.  He has been advised pain will remain an issue and he will need to participate with therapy as best as he can. Increase in pain meds is resulting in low BP and affecting his HD sessions. Midodrine  does seem to help his BP therefore its scheduled for now.  -Ongoing pain management.    MS Contin  15 mg every 12 hours  oxycodone  15 mg IR every morning before working with therapy  oxycodone  IR 15 mg every 6 as needed. Robaxin  500 mg 3 times daily Gabapentin  100 mg 3 times daily     Goals of care - Disposition planning with patient, TOC, infectious disease, nephrology.  Infectious disease consulted recommending conservative management with IV cefepime  plus vancomycin .  Patient received cefepime  plus vancomycin  with HD for total of 8 weeks, end date 02/09/2024.  Repeat MRI recommended by infectious disease as well, to be scheduled in the outpatient setting.  Working closely with TOC to ensure this is all set up prior to discharge home.  SNF not an option given patient is not a [place]  citizen.  DVT prophylaxis: heparin  injection 5,000 Units Start: 12/15/23 1400    Code Status: Full Code Family Communication:   Status is: Inpatient Remains inpatient appropriate because: Working on safe  disposition   Subjective: Comfortable in HD but continues to report of his lower extremity pain.  I have explained to him again that this pain is chronic and we will not be able to eliminate or make him pain-free.  He has to develop some tolerance to pain.  Increasing pain medicine is at  times limited by his blood pressure and hemodialysis.  Examination:  General exam: Appears calm and comfortable  Respiratory system: Clear to auscultation. Respiratory effort normal. Cardiovascular system: S1 & S2 heard, RRR. No JVD, murmurs, rubs, gallops or clicks. No pedal edema. Gastrointestinal system: Abdomen is nondistended, soft and nontender. No organomegaly or masses felt. Normal bowel sounds heard. Central nervous system: Alert and oriented. No focal neurological deficits. Extremities: b/l bka. Digit amputations noted.  Skin: No rashes, lesions or ulcers Psychiatry: Judgement and insight appear normal. Mood & affect appropriate.

## 2023-12-24 NOTE — Progress Notes (Signed)
 [facility] Progress Note   Subjective:    Seen in his room this morning. BP is borderline low at baseline. Complaints of bilateral pain in his hips related to his infection and he had just received some pain medication before I came in. He denies any chest pain or dyspnea. Next HD 8/14  Objective Vitals:   12/23/23 1528 12/23/23 1954 12/24/23 0353 12/24/23 0529  BP: 105/76 128/73 106/69 (!) 95/56  Pulse: 76 88 86 88  Resp:  18 18 18   Temp: 98.3 F (36.8 C) 98 F (36.7 C) 98.2 F (36.8 C) 98.3 F (36.8 C)  TempSrc:  Oral Oral Oral  SpO2: 93% 95% 98% 98%  Weight:      Height:       Physical Exam General: Awake, alert, NAD Heart: RRR; No murmurs Lungs: Clear anteriorly Abdomen: Soft and non-tender Extremities: B/l BKAs; No edema b/l stumps Dialysis Access: [name]   Filed Weights   12/19/23 1731 12/22/23 0802 12/22/23 1117  Weight: 42.2 kg 42.5 kg 42 kg   No intake or output data in the 24 hours ending 12/24/23 1058   Additional Objective Labs: Basic Metabolic Panel: Recent Labs  Lab 12/19/23 0450 12/22/23 0811 12/23/23 0702 12/24/23 0513  NA 136 127* 130* 130*  K 4.7 4.0 3.7 3.9  CL 99 89* 93* 91*  CO2 19* 21* 24 24  GLUCOSE 142* 162* 141* 176*  BUN 30* 35* 19 27*  CREATININE 6.48* 7.40* 5.02* 6.35*  CALCIUM  9.1 9.1 9.1 9.3  PHOS 5.6* 5.3*  --  4.9*   Liver Function Tests: Recent Labs  Lab 12/22/23 0811  ALBUMIN  2.3*   No results for input(s): LIPASE, AMYLASE in the last 168 hours. CBC: Recent Labs  Lab 12/18/23 0635 12/19/23 0450 12/22/23 0500 12/23/23 0702 12/24/23 0513  WBC 10.9* 8.1 7.3 6.6 6.5  NEUTROABS  --   --  5.3  --   --   HGB 9.4* 9.7* 8.9* 9.4* 8.9*  HCT 29.7* 30.8* 28.3* 29.3* 27.6*  MCV 102.1* 100.3* 100.7* 99.0 98.2  PLT 261 257 227 231 226     CBG: Recent Labs  Lab 12/23/23 0752 12/23/23 1142 12/23/23 1528 12/23/23 1953 12/24/23 0838  GLUCAP 113* 161* 147* 184* 154*   Medications:  ceFEPime   (MAXIPIME ) IV     vancomycin  Stopped (12/22/23 1111)    acetaminophen   1,000 mg Oral TID   bictegravir-emtricitabine -tenofovir  AF  1 tablet Oral Daily   Chlorhexidine  Gluconate Cloth  6 each Topical Q0600   gabapentin   100 mg Oral TID   heparin   5,000 Units Subcutaneous Q8H   heparin  sodium (porcine)  4,000 Units Intracatheter Once   insulin  aspart  0-5 Units Subcutaneous QHS   insulin  aspart  0-6 Units Subcutaneous TID WC   lidocaine   1 patch Transdermal Q24H   methocarbamol   500 mg Oral TID   sevelamer  carbonate  800 mg Oral TID WC   sodium chloride  flush  10-40 mL Intracatheter Q12H    Dialysis Orders: [facility]-MWF 3.5h  B400  45.5kg  TDC  Heparin  2000 Mircera 100 mcg - last dose 12/01/23 Calcitriol  1.0 mcg (on hold due to high Ca)   Home bp meds: Midodrine  10mg  tid prn for SBP < 100 Midodrine  10mg  pre HD mwf prn  Assessment/Plan: 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 Blood cx's sent, IV vanc/ cefepime  started. Discussed with ID and pharmacy: plan for IV Cefepime  2GM and Vancomycin  500mg  with HD X 8  weeks end date 02/09/24.  ESRD: on HD MWF. Due to staffing we had to bump his HD until tomorrow. Patient is okay with that. Labs are okay. Next HD 8/14. BP: chronic low BP on midodrine  10 tid prn and 10mg  pre HD mwf as needed for SBP < 100. Can give albumin  during HD for low BP.  Volume: not a big volume gainer, limits wt gains. 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 HIV: controlled on Biktarvy  BMD: Corrected Ca 10.3 and phos 4.9. Continue sevelamer  TID WC. VDRA on hold due to high Ca.  Belvie Och, NP [facility] 12/24/2023,10:58 AM  LOS: 9 days

## 2023-12-24 NOTE — Progress Notes (Signed)
 Physical Therapy Treatment Patient Details Name: Alvin Daniels MRN: [id number] DOB: 06/12/1970 Today's Date: 12/24/2023   History of Present Illness Patient is a 53 y/o male admitted 12/14/23 with hip pain MRI showing R psoas abscess and L4-L5 discitis and osteomyelitis. 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  PMH includes ESRD on HD MWF, hepC, MRSA, HIV, bilat BKA, DMII.    PT Comments  Continuing work on functional mobility and activity tolerance;  Much of session focused on pain management enough for pt to be able to participate in functional mobility, and today, pt's pain was too much for him to be able to stand up and take steps with RW and prostheses;   Offered to work with wheelchair, which he uses at home when he is hurting too much, but he ultimately declined practicing with wheelchair (and drop-arm BSC with OT);   He needs to be able to safely walk in order to get home (or use wheelchair safely); evaluation of pain management to get to consistent ability to participate in functional mobility activities and ADLs is a focus of PT, and Dr. Caleen was present for the majority of session, as well as Alvin Daniels, TOC RNCM;   Lots of communication among the Team re: options for post-acute rehab -- would be complicated to get to a post-acute rehab venue, and Alvin Daniels is being considered for [facility];   Have arranged with HD for pt to be taken to HD on second round (about 11am) as consistently as possible to allow for PT/OT to work with him before going to dialysis;   Will also try to establish a consistent appointment-type time with Medical Spanish Interpreter as well.   If plan is discharge home, recommend the following: A little help with walking and/or transfers;Help with stairs or ramp for entrance;Assist for transportation   Can travel by private vehicle        Equipment Recommendations  None  recommended by PT    Recommendations for Other Services       Precautions / Restrictions Precautions Precautions: Fall Precaution/Restrictions Comments: B BKA; prosthetics in the room Restrictions Weight Bearing Restrictions Per Provider Order: No     Mobility  Bed Mobility Overal bed mobility: Modified Independent             General bed mobility comments: Pt able to progress to EOB without physical assist, rail support, scoot along EOB to wards foot of bed and back to center    Transfers                   General transfer comment: Pt declined, too much pain    Ambulation/Gait                   Stairs             Wheelchair Mobility     Tilt Bed    Modified Rankin (Stroke Patients Only)       Balance     Sitting balance-Leahy Scale: Good                                      Communication Communication Communication: No apparent difficulties Factors Affecting Communication: Non - English speaking, interpreter not available (in person interpreter used)  Cognition Arousal: Alert Behavior During Therapy: [facility] for tasks assessed/performed  Following commands: Intact      Cueing    Exercises      General Comments General comments (skin integrity, edema, etc.): Dr. Caleen joined us  for teh session, as well as Alvin, [facility] RN; Lots of time in communication with Team considering DC plans; went through list of AE that may benefit pt but he did not want any of them.      Pertinent Vitals/Pain Pain Assessment Pain Assessment: 0-10 Pain Score: 10-Worst pain ever Pain Location: per pt report, initially had trouble understanding the number scale Pain Descriptors / Indicators: Grimacing, Guarding, Aching Pain Intervention(s): Monitored during session    Home Living                          Prior Function            PT Goals (current goals can now be found in the  care plan section) Acute Rehab PT Goals Patient Stated Goal: to improve pain to walk PT Goal Formulation: With patient Time For Goal Achievement: 01/03/24 Potential to Achieve Goals: Good Progress towards PT goals: Progressing toward goals (intermittent progress, depending on the level his pain is managed)    Frequency    Min 2X/week      PT Plan      Co-evaluation              AM-PAC PT 6 Clicks Mobility   Outcome Measure  Help needed turning from your back to your side while in a flat bed without using bedrails?: None Help needed moving from lying on your back to sitting on the side of a flat bed without using bedrails?: None Help needed moving to and from a bed to a chair (including a wheelchair)?: A Little Help needed standing up from a chair using your arms (e.g., wheelchair or bedside chair)?: A Little Help needed to walk in hospital room?: A Little Help needed climbing 3-5 steps with a railing? : Total 6 Click Score: 18    End of Session   Activity Tolerance: Patient tolerated treatment well;Other (comment) (though too much pain to be able to get up to stand adn try wlking) Patient left: in bed;with call bell/phone within reach Nurse Communication: Mobility status PT Visit Diagnosis: Pain;Other abnormalities of gait and mobility (R26.89) Pain - Right/Left: Right Pain - part of body: Hip (and up the spine)     Time: 9056-8969 PT Time Calculation (min) (ACUTE ONLY): 47 min  Charges:    $Therapeutic Activity: 8-22 mins PT General Charges $$ ACUTE PT VISIT: 1 Visit                     Silvano Currier, PT  Acute Rehabilitation Services Office [phone] Secure Chat welcomed    Silvano VEAR Currier 12/24/2023, 1:51 PM

## 2023-12-25 LAB — BASIC METABOLIC PANEL WITH GFR
Anion gap: 14 (ref 5–15)
BUN: 37 mg/dL — ABNORMAL HIGH (ref 6–20)
CO2: 21 mmol/L — ABNORMAL LOW (ref 22–32)
Calcium: 9.2 mg/dL (ref 8.9–10.3)
Chloride: 91 mmol/L — ABNORMAL LOW (ref 98–111)
Creatinine, Ser: 7.56 mg/dL — ABNORMAL HIGH (ref 0.61–1.24)
GFR, Estimated: 8 mL/min — ABNORMAL LOW (ref >60)
Glucose, Bld: 170 mg/dL — ABNORMAL HIGH (ref 70–99)
Potassium: 4.1 mmol/L (ref 3.5–5.1)
Sodium: 126 mmol/L — ABNORMAL LOW (ref 135–145)

## 2023-12-25 LAB — CBC
HCT: 27.2 % — ABNORMAL LOW (ref 39.0–52.0)
Hemoglobin: 8.8 g/dL — ABNORMAL LOW (ref 13.0–17.0)
MCH: 32.1 pg (ref 26.0–34.0)
MCHC: 32.4 g/dL (ref 30.0–36.0)
MCV: 99.3 fL (ref 80.0–100.0)
Platelets: 232 K/uL (ref 150–400)
RBC: 2.74 MIL/uL — ABNORMAL LOW (ref 4.22–5.81)
RDW: 14.8 % (ref 11.5–15.5)
WBC: 6.4 K/uL (ref 4.0–10.5)
nRBC: 0 % (ref 0.0–0.2)

## 2023-12-25 LAB — GLUCOSE, CAPILLARY
Glucose-Capillary: 187 mg/dL — ABNORMAL HIGH (ref 70–99)
Glucose-Capillary: 121 mg/dL — ABNORMAL HIGH (ref 70–99)
Glucose-Capillary: 128 mg/dL — ABNORMAL HIGH (ref 70–99)

## 2023-12-25 LAB — MAGNESIUM: Magnesium: 2.3 mg/dL (ref 1.7–2.4)

## 2023-12-25 MED ORDER — VANCOMYCIN HCL 500 MG/100ML IV SOLN
INTRAVENOUS | Status: AC
Start: 2023-12-25 — End: 2023-12-25
  Filled 2023-12-25: qty 100

## 2023-12-25 MED ORDER — VANCOMYCIN HCL 500 MG/100ML IV SOLN: 500.0000 mg | INTRAVENOUS | Status: DC

## 2023-12-25 MED ORDER — MIDODRINE HCL 5 MG PO TABS
5.0000 mg | ORAL_TABLET | Freq: Three times a day (TID) | ORAL | Status: DC
  Administered 2023-12-25 – 2023-12-26 (×3): 5 mg via ORAL
  Filled 2023-12-25 (×4): qty 1

## 2023-12-25 MED ORDER — SODIUM CHLORIDE 0.9 % IV SOLN
1.0000 g | INTRAVENOUS | Status: DC
  Filled 2023-12-25 (×2): qty 10

## 2023-12-25 MED ORDER — CIPROFLOXACIN HCL 500 MG PO TABS
500.0000 mg | ORAL_TABLET | Freq: Once | ORAL | Status: AC
  Administered 2023-12-25: 500 mg via ORAL
  Filled 2023-12-25: qty 1

## 2023-12-25 NOTE — Progress Notes (Signed)
 OT Cancellation Note  Patient Details Name: Alvin Daniels MRN: [id number] DOB: 05/06/1971   Cancelled Treatment:    Reason Eval/Treat Not Completed: Patient at procedure or test/ unavailable (Pt at HD, OT to follow-up with pt as able)  12/25/2023  AB, OTR/L  Acute Rehabilitation Services  Office: [phone]   [name] 12/25/2023, 8:29 AM

## 2023-12-25 NOTE — Progress Notes (Addendum)
 Pharmacy Antibiotic Note  Alvin Daniels is a 53 y.o. male admitted on 12/14/2023 with osteomyelitis.  Pharmacy has been consulted for vancomycin  and cefepime  dosing. Patient received loading dose of vancomycin  1000mg  in ED as well as 2g dose of cefepime . WBC is WNL and patient is afebrile. Stop date of 02/09/24 (8 weeks from start date of 8/4).    Vancomycin  pre-dialysis level of 21 this AM (within target range of 15-25 on current regimen). HD off schedule today - Vanc given in HD.  Missed Cefepime  with HD today.   Plan: Vanc given with HD today (off schedule). Next HD 8/16 - Will schedule Vanc 500 x1 post HD, Then continue IV vancomycin  500 mg every MWF with dialysis - Monitor schedule and adjust if needed.  Adjust Cefepime  to 1g IV q24h for admission to prevent further doses missed - start this PM.  OK to discharge on 2g QHD-MWF as planned.  Monitor cultures and s/sx of infection. Stop dates 02/09/24.   ADDENDUM (1800) PIV line placed by IV team for cefepime  administration but cefepime  infiltrated so line had to be removed. Discussed with Dr. Caleen and decision made to do cipro  500mg  po tonight and reassess in a.m.   Vito Ralph, PharmD, BCPS Please see amion for complete clinical pharmacist phone list 12/25/2023 6:19 PM  Height: 4' 11 (149.9 cm) Weight: 42 kg (92 lb 9.5 oz) IBW/kg (Calculated) : 47.7  Temp (24hrs), Avg:98.2 F (36.8 C), Min:97.9 F (36.6 C), Max:98.7 F (37.1 C)  Recent Labs  Lab 12/19/23 0450 12/22/23 0500 12/22/23 0811 12/23/23 0702 12/24/23 0513 12/25/23 0615  WBC 8.1 7.3  --  6.6 6.5 6.4  CREATININE 6.48*  --  7.40* 5.02* 6.35* 7.56*  VANCORANDOM  --   --  21  --   --   --     ESRD on HD, receives HD MWF outpatient.  No Known Allergies  Antimicrobials this admission: Cefepime  8/4 >>  Vancomycin  8/4 >>  Dose adjustments this admission: N/A  Microbiology results: Previous MRSA bacteremia May 2025.  8/4 2x Bcx: in  process  Thank you for allowing pharmacy to be a part of this patient's care.  Harlene Boga, PharmD, BCPS, BCCCP Please refer to [facility] for [facility] Pharmacy numbers 1:24 PM, 12/25/2023

## 2023-12-25 NOTE — Plan of Care (Signed)
 Pt alert and oriented.  Left for HD this morning. Bp remained soft throughout shift upon return.  No IV access noted.  IV team consulted and placed PIV to RFA.  PIV infiltrated shortly after.  Unable to give IV cefepime  at this time.  BP now stable. MD notified. Will report to next shift. Problem: Education: Goal: Knowledge of General Education information will improve Description: Including pain rating scale, medication(s)/side effects and non-pharmacologic comfort measures Outcome: Progressing   Problem: Health Behavior/Discharge Planning: Goal: Ability to manage health-related needs will improve Outcome: Progressing   Problem: Clinical Measurements: Goal: Ability to maintain clinical measurements within normal limits will improve Outcome: Progressing Goal: Will remain free from infection Outcome: Progressing Goal: Diagnostic test results will improve Outcome: Progressing Goal: Respiratory complications will improve Outcome: Progressing Goal: Cardiovascular complication will be avoided Outcome: Progressing   Problem: Activity: Goal: Risk for activity intolerance will decrease Outcome: Progressing   Problem: Nutrition: Goal: Adequate nutrition will be maintained Outcome: Progressing   Problem: Coping: Goal: Level of anxiety will decrease Outcome: Progressing   Problem: Elimination: Goal: Will not experience complications related to bowel motility Outcome: Progressing Goal: Will not experience complications related to urinary retention Outcome: Progressing   Problem: Pain Managment: Goal: General experience of comfort will improve and/or be controlled Outcome: Progressing   Problem: Safety: Goal: Ability to remain free from injury will improve Outcome: Progressing   Problem: Skin Integrity: Goal: Risk for impaired skin integrity will decrease Outcome: Progressing   Problem: Education: Goal: Ability to describe self-care measures that may prevent or decrease  complications (Diabetes Survival Skills Education) will improve Outcome: Progressing Goal: Individualized Educational Video(s) Outcome: Progressing   Problem: Coping: Goal: Ability to adjust to condition or change in health will improve Outcome: Progressing   Problem: Fluid Volume: Goal: Ability to maintain a balanced intake and output will improve Outcome: Progressing   Problem: Health Behavior/Discharge Planning: Goal: Ability to identify and utilize available resources and services will improve Outcome: Progressing Goal: Ability to manage health-related needs will improve Outcome: Progressing   Problem: Metabolic: Goal: Ability to maintain appropriate glucose levels will improve Outcome: Progressing   Problem: Nutritional: Goal: Maintenance of adequate nutrition will improve Outcome: Progressing Goal: Progress toward achieving an optimal weight will improve Outcome: Progressing   Problem: Skin Integrity: Goal: Risk for impaired skin integrity will decrease Outcome: Progressing   Problem: Tissue Perfusion: Goal: Adequacy of tissue perfusion will improve Outcome: Progressing

## 2023-12-25 NOTE — Progress Notes (Signed)
   12/25/23 1205  Vitals  Temp 97.9 F (36.6 C)  Pulse Rate 98  Resp 11  BP (!) 92/40  SpO2 100 %  Weight 42 kg  Type of Weight Post-Dialysis  Post Treatment  Dialyzer Clearance Lightly streaked  Hemodialysis Intake (mL) 100 mL  Liters Processed 79  Fluid Removed (mL) 800 mL  Tolerated HD Treatment Yes  Post-Hemodialysis Comments BP SOFT NO ISSUES TOLERATED WELL   Received patient in bed to unit.  Alert and oriented.  Informed consent signed and in chart.   TX duration:3.25HRS  Patient tolerated well.  Transported back to the room  Alert, without acute distress.  Hand-off given to patient's nurse.   Access used: [facility] Access issues: NONE  Total UF removed: Medication(s) given: VANC    [facility]

## 2023-12-25 NOTE — Progress Notes (Signed)
 OT Cancellation Note  Patient Details Name: Alvin Daniels MRN: [id number] DOB: [birth year]   Cancelled Treatment:    Reason Eval/Treat Not Completed: Pain limiting ability to participate (discussed with providing PT, pt was not open to any mobility besides bed level activity after being in too much pain following HD and not having meds to control it. He reports being more open for therapy tomorrow. OT to follow up with pt as able)  12/25/2023  AB, OTR/L  Acute Rehabilitation Services  Office: [phone]   [name] 12/25/2023, 3:06 PM

## 2023-12-25 NOTE — Plan of Care (Addendum)
 06:45 Report given to HD RN. Midodrine  given per order after report to HD RN. PRN oxycodone  given for pain per pt request.   Problem: Education: Goal: Knowledge of General Education information will improve Description: Including pain rating scale, medication(s)/side effects and non-pharmacologic comfort measures Outcome: Progressing   Problem: Health Behavior/Discharge Planning: Goal: Ability to manage health-related needs will improve Outcome: Progressing   Problem: Clinical Measurements: Goal: Ability to maintain clinical measurements within normal limits will improve Outcome: Progressing Goal: Will remain free from infection Outcome: Progressing Goal: Diagnostic test results will improve Outcome: Progressing Goal: Respiratory complications will improve Outcome: Progressing Goal: Cardiovascular complication will be avoided Outcome: Progressing   Problem: Activity: Goal: Risk for activity intolerance will decrease Outcome: Progressing   Problem: Nutrition: Goal: Adequate nutrition will be maintained Outcome: Progressing   Problem: Coping: Goal: Level of anxiety will decrease Outcome: Progressing   Problem: Elimination: Goal: Will not experience complications related to bowel motility Outcome: Progressing Goal: Will not experience complications related to urinary retention Outcome: Progressing   Problem: Pain Managment: Goal: General experience of comfort will improve and/or be controlled Outcome: Progressing   Problem: Safety: Goal: Ability to remain free from injury will improve Outcome: Progressing   Problem: Skin Integrity: Goal: Risk for impaired skin integrity will decrease Outcome: Progressing   Problem: Education: Goal: Ability to describe self-care measures that may prevent or decrease complications (Diabetes Survival Skills Education) will improve Outcome: Progressing Goal: Individualized Educational Video(s) Outcome: Progressing   Problem:  Coping: Goal: Ability to adjust to condition or change in health will improve Outcome: Progressing   Problem: Fluid Volume: Goal: Ability to maintain a balanced intake and output will improve Outcome: Progressing   Problem: Health Behavior/Discharge Planning: Goal: Ability to identify and utilize available resources and services will improve Outcome: Progressing Goal: Ability to manage health-related needs will improve Outcome: Progressing   Problem: Metabolic: Goal: Ability to maintain appropriate glucose levels will improve Outcome: Progressing   Problem: Nutritional: Goal: Maintenance of adequate nutrition will improve Outcome: Progressing Goal: Progress toward achieving an optimal weight will improve Outcome: Progressing   Problem: Skin Integrity: Goal: Risk for impaired skin integrity will decrease Outcome: Progressing   Problem: Tissue Perfusion: Goal: Adequacy of tissue perfusion will improve Outcome: Progressing

## 2023-12-25 NOTE — TOC Progression Note (Signed)
 Transition of Care [facility]) - Progression Note    Patient Details  Name: Alvin Daniels MRN: [id number] Date of Birth: October 24, 1970  [facility]) CM/SW Contact  Lauraine FORBES Saa, LCSWA Phone Number: 12/25/2023, 4:59 PM  Clinical Narrative:     4:59 PM Per [facility] program admissions, [facility] admissions coordinators will continue to follow patient and reassess Monday for patient's progress with therapies and pain management. TOC will also continue to follow and be available to assist.  Expected Discharge Plan: Home/Self Care Barriers to Discharge: Continued Medical Work up               Expected Discharge Plan and Services       Living arrangements for the past 2 months: Single Family Home                                       Social Drivers of Health (SDOH) Interventions SDOH Screenings   Food Insecurity: No Food Insecurity (12/15/2023)  Housing: Low Risk  (12/15/2023)  Transportation Needs: No Transportation Needs (12/15/2023)  Utilities: Not At Risk (12/15/2023)  Alcohol  Screen: Low Risk  (07/17/2023)  Depression (PHQ2-9): Low Risk  (07/17/2023)  Financial Resource Strain: Low Risk  (01/29/2023)  Physical Activity: Inactive (01/29/2023)  Social Connections: Moderately Isolated (10/08/2023)  Stress: No Stress Concern Present (01/29/2023)  Tobacco Use: Low Risk  (12/14/2023)  Health Literacy: Adequate Health Literacy (01/29/2023)    Readmission Risk Interventions     No data to display

## 2023-12-25 NOTE — Progress Notes (Signed)
 Physical Therapy Treatment Patient Details Name: Alvin Daniels MRN: [id number] DOB: 1970-07-20 Today's Date: 12/25/2023   History of Present Illness Patient is a 53 y/o male admitted 12/14/23 with hip pain MRI showing R psoas abscess and L4-L5 discitis and osteomyelitis. 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  PMH includes ESRD on HD MWF, hepC, MRSA, HIV, bilat BKA, DMII.    PT Comments  Patient seen after morning dialysis. He reports continued right leg pain. Patient agreeable to try mobilizing after much encouragement. He required Mod A to achieve long sitting today due to right leg pain. Heavy reliance on upper body to maintain sitting balance, likely due to pain. Encouraged seated repositioning and off loading for skin integrity. Patient declined further mobility but states he would be willing to try again tomorrow. He has decreased safety awareness and asked several times to have rolling walker and bilateral prosthesis in reach so he can try to stand independently if he feels better. Encouraged patient to ask for staff assistance for safety and fall prevention. PT will continue to follow.    If plan is discharge home, recommend the following: A little help with walking and/or transfers;Help with stairs or ramp for entrance;Assist for transportation   Can travel by private vehicle        Equipment Recommendations  None recommended by PT    Recommendations for Other Services       Precautions / Restrictions Precautions Precautions: Fall Recall of Precautions/Restrictions: Impaired Precaution/Restrictions Comments: B BKA; prosthetics in the room Restrictions Weight Bearing Restrictions Per Provider Order: No     Mobility  Bed Mobility Overal bed mobility: Needs Assistance Bed Mobility: Supine to Sit (supine to longsitting)     Supine to sit: Mod assist     General bed mobility comments:  assistance for trunk support. increased time and effort required with leg pain reported with mobility    Transfers                   General transfer comment: patient declined to attempt due to leg pain. education and encouragement for repositioning while sitting using upper body support for skin integrity    Ambulation/Gait                   Stairs             Wheelchair Mobility     Tilt Bed    Modified Rankin (Stroke Patients Only)       Balance Overall balance assessment: Needs assistance Sitting-balance support: Bilateral upper extremity supported Sitting balance-Leahy Scale: Fair Sitting balance - Comments: patient relying heavily on upper body support to maintain long sitting position due to right leg pain                                    Communication Communication Communication: No apparent difficulties Factors Affecting Communication:  (AMN language interpreter used throughout session)  Cognition Arousal: Alert Behavior During Therapy: WFL for tasks assessed/performed   PT - Cognitive impairments: Safety/Judgement                       PT - Cognition Comments: patient with decreased awareness of need for assistance. he asked several times to have the rolling walker and prosthesis left by the bed so that he can get up if he feels better. education  to have staff assistance for safety Following commands: Intact      Cueing Cueing Techniques: Verbal cues, Visual cues  Exercises      General Comments General comments (skin integrity, edema, etc.): patient reports he would be agreeable to try more mobility tomorrow      Pertinent Vitals/Pain Pain Assessment Pain Assessment: 0-10 Pain Score: 8  Pain Location: right leg Pain Descriptors / Indicators: Discomfort Pain Intervention(s): Monitored during session, Limited activity within patient's tolerance, Repositioned    Home Living                           Prior Function            PT Goals (current goals can now be found in the care plan section) Acute Rehab PT Goals Patient Stated Goal: to improve pain to walk PT Goal Formulation: With patient Time For Goal Achievement: 01/03/24 Potential to Achieve Goals: Good Progress towards PT goals: Progressing toward goals    Frequency    Min 2X/week      PT Plan      Co-evaluation              AM-PAC PT 6 Clicks Mobility   Outcome Measure  Help needed turning from your back to your side while in a flat bed without using bedrails?: None Help needed moving from lying on your back to sitting on the side of a flat bed without using bedrails?: A Lot Help needed moving to and from a bed to a chair (including a wheelchair)?: A Little Help needed standing up from a chair using your arms (e.g., wheelchair or bedside chair)?: A Little Help needed to walk in hospital room?: A Little Help needed climbing 3-5 steps with a railing? : Total 6 Click Score: 16    End of Session   Activity Tolerance: Patient limited by pain Patient left: in bed;with call bell/phone within reach;with bed alarm set Nurse Communication: Mobility status PT Visit Diagnosis: Pain;Other abnormalities of gait and mobility (R26.89) Pain - Right/Left: Right Pain - part of body: Hip     Time: 8686-8673 PT Time Calculation (min) (ACUTE ONLY): 13 min  Charges:    $Therapeutic Activity: 8-22 mins PT General Charges $$ ACUTE PT VISIT: 1 Visit                     Randine Essex, PT, MPT    Randine LULLA Essex 12/25/2023, 1:39 PM

## 2023-12-25 NOTE — Progress Notes (Signed)
 PROGRESS NOTE    Alvin Daniels  FMW:[id number] DOB: [birth year] DOA: 12/14/2023 PCP: Pcp, No    Brief Narrative:   53 y.o. 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 coli and Streptococcus anginosus who presents with hip pain MRI was done that showed a right psoas abscess and L4-L5 discitis and osteomyelitis.  Evaluated by IR but no safe access and not a surgical candidate.  ID is recommending total 8 weeks of vancomycin  and cefepime  with HD, EOT 02/09/2024    Assessment & Plan:  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)   Severe protein-calorie malnutrition ([name])   Normocytic anemia   ESRD on hemodialysis ([name])   S/P bilateral BKA (below knee amputation) ([name])   Chronic hepatitis C without hepatic coma ([name])   HIV (human immunodeficiency virus infection) ([name])   Hx MRSA infection   Osteomyelitis of lumbar spine ([name])   Epidural abscess   Paraspinal abscess ([name])   Pyogenic inflammation of bone ([name])     L4-L5 osteomyelitis/discitis with right psoas abscess - Coverage empirically with cefepime  plus vancomycin .  MRI sacrum noting a 2.2 x 1 x 4.1 cm right psoas abscess.  Repeat MRI this morning noting discitis/osteomyelitis at L4-L5, right psoas abscess, and epidural phlegmon.  Also noting small paraspinal abscess anterior to L4-L5 as well as possible small left psoas abscess.  IR consulted for possible drainage however no safe access appreciated.  Not a surgical candidate.  Infectious disease consulted recommending conservative management with IV cefepime  plus vancomycin .  Patient to receive cefepime  plus vancomycin  with  HD for total of 8 weeks, end date 02/09/2024.   ESRD on HD MWF - Nephrology following closely.     Chronic hypotension -Renal following. Change Midodrine  to TID.    Uncontrolled diabetes mellitus - Previous A1c 10 suggesting very poor control.  Currently on insulin  sliding scale.   HIV/chronic hep C - ID following.  Continue Biktarvy .   Severe protein calorie malnutrition - Protein supplementation encouraged.   Bilateral BKA with increased hip/leg pain - Contributing to physical debilitation and muscle weakness.  BL prosthetics in room.  Patient complaining of worsening pain with ambulation.  Physical therapy following closely.  Increased patient's pain regimen to include oxycodone  10 mg every 4 hours, lidocaine  patch, gabapentin .  Would like him to attempt to participate in ambulation as it is the limiting factor in in his discharge home.  Would need to ensure patient is able to be ambulatory prior to discharge.  May consider increasing oxycodone  or other medications.  Pain control will be obviously very difficult given infection and location.   Goals of care - Disposition planning with patient, TOC, infectious disease, nephrology.  Infectious disease consulted recommending conservative management with IV cefepime  plus vancomycin .  Patient received cefepime  plus vancomycin  with HD for total of 8 weeks, end date 02/09/2024.  Repeat MRI recommended by infectious disease as well, to be scheduled in the outpatient setting.  Working closely with TOC to ensure this is all set up prior to discharge home.  SNF not an option given patient is not a [place]  citizen.  DVT prophylaxis: heparin  injection 5,000 Units Start: 12/15/23 1400    Code Status: Full Code Family Communication:   Status  is: Inpatient Remains inpatient appropriate because: Continue hospital stay for pain control. Being considered for [facility] program.     Subjective: Seen on HD, still having hip pain.  Had to delay his pain meds in the  morning due to Low BP.   Examination:  General exam: Appears calm and comfortable  Respiratory system: Clear to auscultation. Respiratory effort normal. Cardiovascular system: S1 & S2 heard, RRR. No JVD, murmurs, rubs, gallops or clicks. No pedal edema. Gastrointestinal system: Abdomen is nondistended, soft and nontender. No organomegaly or masses felt. Normal bowel sounds heard. Central nervous system: Alert and oriented. No focal neurological deficits. Extremities: b/l bka. Digit amputations noted.  Skin: No rashes, lesions or ulcers Psychiatry: Judgement and insight appear normal. Mood & affect appropriate.                Diet Orders (From admission, onward)     Start     Ordered   12/16/23 1646  Diet renal/carb modified with fluid restriction Diet-HS Snack? Nothing; Fluid restriction: 1200 mL Fluid; Room service appropriate? Yes; Fluid consistency: Thin  Diet effective now       Question Answer Comment  Diet-HS Snack? Nothing   Fluid restriction: 1200 mL Fluid   Room service appropriate? Yes   Fluid consistency: Thin      12/16/23 1646            Objective: Vitals:   12/25/23 1200 12/25/23 1203 12/25/23 1204 12/25/23 1205  BP: (!) 87/59 (!) 87/51 (!) 88/48 (!) 92/40  Pulse: 98 (!) 34 98 98  Resp: 16 19 16 11   Temp:   97.9 F (36.6 C) 97.9 F (36.6 C)  TempSrc:      SpO2: 99% (!) 81%  100%  Weight:   41.6 kg 42 kg  Height:        Intake/Output Summary (Last 24 hours) at 12/25/2023 1219 Last data filed at 12/25/2023 1205 Gross per 24 hour  Intake 120 ml  Output 1600 ml  Net -1480 ml   Filed Weights   12/25/23 0824 12/25/23 1204 12/25/23 1205  Weight: 42.4 kg 41.6 kg 42 kg    Scheduled Meds:  acetaminophen   1,000 mg Oral TID   bictegravir-emtricitabine -tenofovir  AF  1 tablet Oral Daily   Chlorhexidine  Gluconate Cloth  6 each Topical Q0600   gabapentin   100 mg Oral TID   heparin   5,000 Units Subcutaneous Q8H   heparin  sodium (porcine)  4,000  Units Intracatheter Once   insulin  aspart  0-5 Units Subcutaneous QHS   insulin  aspart  0-6 Units Subcutaneous TID WC   lidocaine   1 patch Transdermal Q24H   methocarbamol   500 mg Oral TID   midodrine   5 mg Oral TID WC   morphine   15 mg Oral Q12H   oxyCODONE   15 mg Oral Q24H   sevelamer  carbonate  800 mg Oral TID WC   sodium chloride  flush  10-40 mL Intracatheter Q12H   Continuous Infusions:  ceFEPime  (MAXIPIME ) IV Stopped (12/24/23 1200)   ceFEPime  (MAXIPIME ) IV     vancomycin  500 mg (12/25/23 1033)   vancomycin       Nutritional status     Body mass index is 18.7 kg/m.  Data Reviewed:   CBC: Recent Labs  Lab 12/19/23 0450 12/22/23 0500 12/23/23 0702 12/24/23 0513 12/25/23 0615  WBC 8.1 7.3 6.6 6.5 6.4  NEUTROABS  --  5.3  --   --   --   HGB 9.7* 8.9* 9.4* 8.9* 8.8*  HCT  30.8* 28.3* 29.3* 27.6* 27.2*  MCV 100.3* 100.7* 99.0 98.2 99.3  PLT 257 227 231 226 232   Basic Metabolic Panel: Recent Labs  Lab 12/19/23 0450 12/22/23 0811 12/23/23 0702 12/24/23 0513 12/25/23 0615  NA 136 127* 130* 130* 126*  K 4.7 4.0 3.7 3.9 4.1  CL 99 89* 93* 91* 91*  CO2 19* 21* 24 24 21*  GLUCOSE 142* 162* 141* 176* 170*  BUN 30* 35* 19 27* 37*  CREATININE 6.48* 7.40* 5.02* 6.35* 7.56*  CALCIUM  9.1 9.1 9.1 9.3 9.2  MG 2.2  --  2.1 2.1 2.3  PHOS 5.6* 5.3*  --  4.9*  --    GFR: Estimated Creatinine Clearance: 6.7 mL/min (A) (by C-G formula based on SCr of 7.56 mg/dL (H)). Liver Function Tests: Recent Labs  Lab 12/22/23 0811  ALBUMIN  2.3*   No results for input(s): LIPASE, AMYLASE in the last 168 hours. No results for input(s): AMMONIA in the last 168 hours. Coagulation Profile: No results for input(s): INR, PROTIME in the last 168 hours. Cardiac Enzymes: No results for input(s): CKTOTAL, CKMB, CKMBINDEX, TROPONINI in the last 168 hours. BNP (last 3 results) No results for input(s): PROBNP in the last 8760 hours. HbA1C: No results for input(s):  HGBA1C in the last 72 hours. CBG: Recent Labs  Lab 12/24/23 0838 12/24/23 1300 12/24/23 1645 12/24/23 2146 12/25/23 0614  GLUCAP 154* 170* 142* 213* 187*   Lipid Profile: No results for input(s): CHOL, HDL, LDLCALC, TRIG, CHOLHDL, LDLDIRECT in the last 72 hours. Thyroid Function Tests: No results for input(s): TSH, T4TOTAL, FREET4, T3FREE, THYROIDAB in the last 72 hours. Anemia Panel: No results for input(s): VITAMINB12, FOLATE, FERRITIN, TIBC, IRON , RETICCTPCT in the last 72 hours. Sepsis Labs: No results for input(s): PROCALCITON, LATICACIDVEN in the last 168 hours.  No results found for this or any previous visit (from the past 240 hours).       Radiology Studies: No results found.         LOS: 10 days   Time spent= 35 mins    Burgess JAYSON Dare, MD [facility]  If 7PM-7AM, please contact night-coverage  12/25/2023, 12:19 PM

## 2023-12-25 NOTE — Procedures (Signed)
 I was present at this dialysis session. I have reviewed the session itself and made appropriate changes.   Vital signs in last 24 hours:  Temp:  [97.9 F (36.6 C)-98.9 F (37.2 C)] 98.1 F (36.7 C) (08/14 0824) Pulse Rate:  [59-106] 90 (08/14 0900) Resp:  [8-19] 8 (08/14 0900) BP: (84-134)/(58-80) 105/58 (08/14 0900) SpO2:  [94 %-100 %] 96 % (08/14 0900) Weight:  [42.4 kg] 42.4 kg (08/14 0824) Weight change:  Filed Weights   12/22/23 0802 12/22/23 1117 12/25/23 0824  Weight: 42.5 kg 42 kg 42.4 kg    Recent Labs  Lab 12/24/23 0513 12/25/23 0615  NA 130* 126*  K 3.9 4.1  CL 91* 91*  CO2 24 21*  GLUCOSE 176* 170*  BUN 27* 37*  CREATININE 6.35* 7.56*  CALCIUM  9.3 9.2  PHOS 4.9*  --     Recent Labs  Lab 12/22/23 0500 12/23/23 0702 12/24/23 0513 12/25/23 0615  WBC 7.3 6.6 6.5 6.4  NEUTROABS 5.3  --   --   --   HGB 8.9* 9.4* 8.9* 8.8*  HCT 28.3* 29.3* 27.6* 27.2*  MCV 100.7* 99.0 98.2 99.3  PLT 227 231 226 232    Scheduled Meds:  acetaminophen   1,000 mg Oral TID   bictegravir-emtricitabine -tenofovir  AF  1 tablet Oral Daily   Chlorhexidine  Gluconate Cloth  6 each Topical Q0600   gabapentin   100 mg Oral TID   heparin   5,000 Units Subcutaneous Q8H   heparin  sodium (porcine)  4,000 Units Intracatheter Once   insulin  aspart  0-5 Units Subcutaneous QHS   insulin  aspart  0-6 Units Subcutaneous TID WC   lidocaine   1 patch Transdermal Q24H   methocarbamol   500 mg Oral TID   midodrine   5 mg Oral TID WC   morphine   15 mg Oral Q12H   oxyCODONE   15 mg Oral Q24H   sevelamer  carbonate  800 mg Oral TID WC   sodium chloride  flush  10-40 mL Intracatheter Q12H   Continuous Infusions:  ceFEPime  (MAXIPIME ) IV Stopped (12/24/23 1200)   ceFEPime  (MAXIPIME ) IV     vancomycin  Stopped (12/22/23 1111)   vancomycin      PRN Meds:.glucagon  (human recombinant), hydrALAZINE , ipratropium-albuterol , metoprolol  tartrate, ondansetron  (ZOFRAN ) IV, oxyCODONE , sodium chloride  flush   Fairy DELENA Sellar,  MD 12/25/2023, 9:45 AM

## 2023-12-25 NOTE — Progress Notes (Signed)
 [facility] Progress Note   Subjective:    Seen on HD this morning. BP is borderline low at baseline. Complaints of bilateral pain in his hips related to his infection and he is awaiting his pain. He denies any chest pain or dyspnea. Next HD 8/16  Objective Vitals:   12/24/23 1301 12/24/23 1458 12/24/23 2150 12/25/23 0613  BP: (!) 84/63 95/65 134/80 95/61  Pulse: 81 86 90 88  Resp: 18 18 17 19   Temp: 98.9 F (37.2 C) 98.7 F (37.1 C) 97.9 F (36.6 C) 98.3 F (36.8 C)  TempSrc: Oral Oral Oral Oral  SpO2: 98% 94% 94% 97%  Weight:      Height:       Physical Exam General: Awake, alert, NAD Heart: RRR; No murmurs Lungs: Clear anteriorly Abdomen: Soft and non-tender Extremities: B/l BKAs; No edema b/l stumps Dialysis Access: [facility]   Filed Weights   12/19/23 1731 12/22/23 0802 12/22/23 1117  Weight: 42.2 kg 42.5 kg 42 kg    Intake/Output Summary (Last 24 hours) at 12/25/2023 9177 Last data filed at 12/24/2023 1400 Gross per 24 hour  Intake 360 ml  Output --  Net 360 ml     Additional Objective Labs: Basic Metabolic Panel: Recent Labs  Lab 12/19/23 0450 12/22/23 0811 12/23/23 0702 12/24/23 0513 12/25/23 0615  NA 136 127* 130* 130* 126*  K 4.7 4.0 3.7 3.9 4.1  CL 99 89* 93* 91* 91*  CO2 19* 21* 24 24 21*  GLUCOSE 142* 162* 141* 176* 170*  BUN 30* 35* 19 27* 37*  CREATININE 6.48* 7.40* 5.02* 6.35* 7.56*  CALCIUM  9.1 9.1 9.1 9.3 9.2  PHOS 5.6* 5.3*  --  4.9*  --    Liver Function Tests: Recent Labs  Lab 12/22/23 0811  ALBUMIN  2.3*    CBC: Recent Labs  Lab 12/19/23 0450 12/22/23 0500 12/23/23 0702 12/24/23 0513 12/25/23 0615  WBC 8.1 7.3 6.6 6.5 6.4  NEUTROABS  --  5.3  --   --   --   HGB 9.7* 8.9* 9.4* 8.9* 8.8*  HCT 30.8* 28.3* 29.3* 27.6* 27.2*  MCV 100.3* 100.7* 99.0 98.2 99.3  PLT 257 227 231 226 232     CBG: Recent Labs  Lab 12/24/23 0838 12/24/23 1300 12/24/23 1645 12/24/23 2146 12/25/23 0614  GLUCAP 154*  170* 142* 213* 187*   Medications:  ceFEPime  (MAXIPIME ) IV Stopped (12/24/23 1200)   ceFEPime  (MAXIPIME ) IV     vancomycin  Stopped (12/22/23 1111)   vancomycin       acetaminophen   1,000 mg Oral TID   bictegravir-emtricitabine -tenofovir  AF  1 tablet Oral Daily   Chlorhexidine  Gluconate Cloth  6 each Topical Q0600   gabapentin   100 mg Oral TID   heparin   5,000 Units Subcutaneous Q8H   heparin  sodium (porcine)  4,000 Units Intracatheter Once   insulin  aspart  0-5 Units Subcutaneous QHS   insulin  aspart  0-6 Units Subcutaneous TID WC   lidocaine   1 patch Transdermal Q24H   methocarbamol   500 mg Oral TID   midodrine   5 mg Oral TID WC   morphine   15 mg Oral Q12H   oxyCODONE   15 mg Oral Q24H   sevelamer  carbonate  800 mg Oral TID WC   sodium chloride  flush  10-40 mL Intracatheter Q12H    Dialysis Orders: [facility]-MWF 3.5h  B400  45.5kg  TDC  Heparin  2000 Mircera 100 mcg - last dose 12/01/23 Calcitriol  1.0 mcg (on hold due to high Ca)  Home bp meds: Midodrine  10mg  tid prn for SBP < 100 Midodrine  10mg  pre HD mwf prn  Assessment/Plan: 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 Blood cx's sent, IV vanc/ cefepime  started. Discussed with ID and pharmacy: plan for IV Cefepime  2GM and Vancomycin  500mg  with HD X 8 weeks end date 02/09/24.  ESRD: on HD MWF. Due to staffing we had to bump his HD until tomorrow. Patient is okay with that. Labs are okay. Next HD 8/16. BP: chronic low BP on midodrine  10 tid prn and 10mg  pre HD mwf as needed for SBP < 100. Can give albumin  during HD for low BP.  Volume: not a big volume gainer, limits wt gains. 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 HIV: controlled on Biktarvy  BMD: Corrected Ca 10.3 and phos 4.9. Continue sevelamer  TID WC. VDRA on hold due to high Ca.  Belvie Och, NP [facility] 12/25/2023,8:22 AM  LOS: 10 days

## 2023-12-26 LAB — CBC
HCT: 28.9 % — ABNORMAL LOW (ref 39.0–52.0)
Hemoglobin: 9.4 g/dL — ABNORMAL LOW (ref 13.0–17.0)
MCH: 32.3 pg (ref 26.0–34.0)
MCHC: 32.5 g/dL (ref 30.0–36.0)
MCV: 99.3 fL (ref 80.0–100.0)
Platelets: 269 K/uL (ref 150–400)
RBC: 2.91 MIL/uL — ABNORMAL LOW (ref 4.22–5.81)
RDW: 15.1 % (ref 11.5–15.5)
WBC: 7.1 K/uL (ref 4.0–10.5)
nRBC: 0 % (ref 0.0–0.2)

## 2023-12-26 LAB — BASIC METABOLIC PANEL WITH GFR
Anion gap: 13 (ref 5–15)
BUN: 17 mg/dL (ref 6–20)
CO2: 24 mmol/L (ref 22–32)
Calcium: 8.9 mg/dL (ref 8.9–10.3)
Chloride: 94 mmol/L — ABNORMAL LOW (ref 98–111)
Creatinine, Ser: 4.64 mg/dL — ABNORMAL HIGH (ref 0.61–1.24)
GFR, Estimated: 14 mL/min — ABNORMAL LOW (ref >60)
Glucose, Bld: 121 mg/dL — ABNORMAL HIGH (ref 70–99)
Potassium: 4.2 mmol/L (ref 3.5–5.1)
Sodium: 131 mmol/L — ABNORMAL LOW (ref 135–145)

## 2023-12-26 LAB — GLUCOSE, CAPILLARY
Glucose-Capillary: 109 mg/dL — ABNORMAL HIGH (ref 70–99)
Glucose-Capillary: 142 mg/dL — ABNORMAL HIGH (ref 70–99)
Glucose-Capillary: 162 mg/dL — ABNORMAL HIGH (ref 70–99)
Glucose-Capillary: 121 mg/dL — ABNORMAL HIGH (ref 70–99)

## 2023-12-26 LAB — MAGNESIUM: Magnesium: 2 mg/dL (ref 1.7–2.4)

## 2023-12-26 MED ORDER — VANCOMYCIN HCL 500 MG/100ML IV SOLN
500.0000 mg | INTRAVENOUS | Status: DC
  Filled 2023-12-26 (×2): qty 100

## 2023-12-26 MED ORDER — PENTAFLUOROPROP-TETRAFLUOROETH EX AERO: 1.0000 | INHALATION_SPRAY | CUTANEOUS | Status: DC | PRN

## 2023-12-26 MED ORDER — ANTICOAGULANT SODIUM CITRATE 4% (200MG/5ML) IV SOLN: 5.0000 mL | Status: DC | PRN

## 2023-12-26 MED ORDER — HEPARIN SODIUM (PORCINE) 1000 UNIT/ML DIALYSIS
1000.0000 [IU] | INTRAMUSCULAR | Status: DC | PRN
  Administered 2023-12-27: 1000 [IU]

## 2023-12-26 MED ORDER — LIDOCAINE-PRILOCAINE 2.5-2.5 % EX CREA: 1.0000 | TOPICAL_CREAM | CUTANEOUS | Status: DC | PRN

## 2023-12-26 MED ORDER — MIDODRINE HCL 5 MG PO TABS
10.0000 mg | ORAL_TABLET | Freq: Three times a day (TID) | ORAL | Status: DC
  Administered 2023-12-26 – 2024-02-21 (×158): 10 mg via ORAL
  Filled 2023-12-26 (×153): qty 2

## 2023-12-26 MED ORDER — SODIUM CHLORIDE 0.9 % IV SOLN: 2.0000 g | INTRAVENOUS | Status: DC

## 2023-12-26 MED ORDER — CIPROFLOXACIN HCL 500 MG PO TABS
500.0000 mg | ORAL_TABLET | Freq: Once | ORAL | Status: AC
  Administered 2023-12-26: 500 mg via ORAL
  Filled 2023-12-26: qty 1

## 2023-12-26 MED ORDER — VANCOMYCIN HCL 500 MG/100ML IV SOLN
500.0000 mg | INTRAVENOUS | Status: AC
  Administered 2023-12-27: 500 mg via INTRAVENOUS
  Filled 2023-12-26: qty 100

## 2023-12-26 MED ORDER — SODIUM CHLORIDE 0.9 % IV SOLN
2.0000 g | INTRAVENOUS | Status: DC
  Filled 2023-12-26: qty 12.5

## 2023-12-26 MED ORDER — CHLORHEXIDINE GLUCONATE CLOTH 2 % EX PADS
6.0000 | MEDICATED_PAD | Freq: Every day | CUTANEOUS | Status: DC
  Administered 2023-12-27 – 2023-12-28 (×2): 6 via TOPICAL

## 2023-12-26 MED ORDER — ALTEPLASE 2 MG IJ SOLR: 2.0000 mg | Freq: Once | INTRAMUSCULAR | Status: DC | PRN

## 2023-12-26 MED ORDER — HEPARIN SODIUM (PORCINE) 1000 UNIT/ML DIALYSIS: 2000.0000 [IU] | INTRAMUSCULAR | Status: DC | PRN

## 2023-12-26 MED ORDER — LIDOCAINE HCL (PF) 1 % IJ SOLN
5.0000 mL | INTRAMUSCULAR | Status: DC | PRN
Start: 2023-12-26 — End: 2023-12-28

## 2023-12-26 NOTE — Progress Notes (Signed)
 Pharmacy Antibiotic Note  Alvin Daniels is a 53 y.o. male admitted on 12/14/2023 with osteomyelitis.  Pharmacy has been consulted for vancomycin  and cefepime  dosing. Patient received loading dose of vancomycin  1000mg  in ED as well as 2g dose of cefepime . WBC is WNL and patient is afebrile. Stop date of 02/09/24 (8 weeks from start date of 8/4).    Vancomycin  pre-dialysis level of 21 this AM (within target range of 15-25 on current regimen). HD off schedule today - Vanc given in HD.  Missed Cefepime  with HD on 8/14.  Obtained PIV but infiltrated and unable to give Cefepime  1g IV daily.  Gave Cipro  500 po x1 on 8/14.  Discussed with provider - will give another Cipro  dose today to bridge until next HD 8/16.  HD team educated to y-site both Cefepime  and Vancomycin  and give both during HD.   Plan: Next HD 8/16 - Will schedule Vanc 500 x1 post HD, Then continue IV vancomycin  500 mg every MWF with dialysis starting Monday. Cipro  500mg  po x1 today.  Resume Cefepime  with HD: Give 2g QHD x1 8/16, then resume 2g QHD-MWF starting on Monday.  Monitor HD plans in patient for need to adjust timing.  OK to discharge on 2g QHD-MWF as planned.  Monitor cultures and s/sx of infection. Stop dates 02/09/24.   Height: 4' 11 (149.9 cm) Weight: 42 kg (92 lb 9.5 oz) IBW/kg (Calculated) : 47.7  Temp (24hrs), Avg:98.3 F (36.8 C), Min:97.9 F (36.6 C), Max:98.9 F (37.2 C)  Recent Labs  Lab 12/22/23 0500 12/22/23 0811 12/23/23 0702 12/24/23 0513 12/25/23 0615 12/26/23 0445  WBC 7.3  --  6.6 6.5 6.4 7.1  CREATININE  --  7.40* 5.02* 6.35* 7.56* 4.64*  VANCORANDOM  --  21  --   --   --   --     ESRD on HD, receives HD MWF outpatient.  No Known Allergies  Antimicrobials this admission: Cefepime  8/4 >>  Vancomycin  8/4 >>  Dose adjustments this admission: N/A  Microbiology results: Previous MRSA bacteremia May 2025.  8/4 2x Bcx: in process  Thank you for allowing pharmacy to  be a part of this patient's care.  Harlene Boga, PharmD, BCPS, BCCCP Please refer to [facility] for [facility] Pharmacy numbers 10:56 AM, 12/26/2023

## 2023-12-26 NOTE — Progress Notes (Signed)
 [facility] Progress Note   Subjective:    Seen on HD this morning. BP is borderline low at baseline. Complaints of pain in his legs today. His pain medication was increased yesterday. He denies any chest pain or dyspnea. Next HD 8/16  Objective Vitals:   12/25/23 1700 12/25/23 1947 12/26/23 0457 12/26/23 0734  BP: 118/79 94/61 91/66  (!) 94/53  Pulse: 92 93 81 85  Resp:  16 16   Temp: 98.4 F (36.9 C) 98.9 F (37.2 C) 98.2 F (36.8 C) 98.1 F (36.7 C)  TempSrc: Oral Oral Oral Oral  SpO2:  100% 94% 91%  Weight:      Height:       Physical Exam General: Awake, alert, NAD Heart: RRR; No murmurs Lungs: Clear anteriorly Abdomen: Soft and non-tender Extremities: B/l BKAs; No edema b/l stumps Dialysis Access: [facility]   Filed Weights   12/22/23 1117 12/25/23 0824 12/25/23 1205  Weight: 42 kg 42.4 kg 42 kg    Intake/Output Summary (Last 24 hours) at 12/26/2023 1018 Last data filed at 12/25/2023 2126 Gross per 24 hour  Intake 120 ml  Output 800 ml  Net -680 ml     Additional Objective Labs: Basic Metabolic Panel: Recent Labs  Lab 12/22/23 0811 12/23/23 0702 12/24/23 0513 12/25/23 0615 12/26/23 0445  NA 127*   < > 130* 126* 131*  K 4.0   < > 3.9 4.1 4.2  CL 89*   < > 91* 91* 94*  CO2 21*   < > 24 21* 24  GLUCOSE 162*   < > 176* 170* 121*  BUN 35*   < > 27* 37* 17  CREATININE 7.40*   < > 6.35* 7.56* 4.64*  CALCIUM  9.1   < > 9.3 9.2 8.9  PHOS 5.3*  --  4.9*  --   --    < > = values in this interval not displayed.   Liver Function Tests: Recent Labs  Lab 12/22/23 0811  ALBUMIN  2.3*    CBC: Recent Labs  Lab 12/22/23 0500 12/23/23 0702 12/24/23 0513 12/25/23 0615 12/26/23 0445  WBC 7.3 6.6 6.5 6.4 7.1  NEUTROABS 5.3  --   --   --   --   HGB 8.9* 9.4* 8.9* 8.8* 9.4*  HCT 28.3* 29.3* 27.6* 27.2* 28.9*  MCV 100.7* 99.0 98.2 99.3 99.3  PLT 227 231 226 232 269     CBG: Recent Labs  Lab 12/24/23 2146 12/25/23 0614 12/25/23 1717  12/25/23 2025 12/26/23 0735  GLUCAP 213* 187* 121* 128* 109*   Medications:  ceFEPime  (MAXIPIME ) IV     [START ON 12/29/2023] vancomycin      [START ON 12/27/2023] vancomycin       acetaminophen   1,000 mg Oral TID   bictegravir-emtricitabine -tenofovir  AF  1 tablet Oral Daily   Chlorhexidine  Gluconate Cloth  6 each Topical Q0600   gabapentin   100 mg Oral TID   heparin   5,000 Units Subcutaneous Q8H   heparin  sodium (porcine)  4,000 Units Intracatheter Once   insulin  aspart  0-5 Units Subcutaneous QHS   insulin  aspart  0-6 Units Subcutaneous TID WC   lidocaine   1 patch Transdermal Q24H   methocarbamol   500 mg Oral TID   midodrine   10 mg Oral TID WC   morphine   15 mg Oral Q12H   oxyCODONE   15 mg Oral Q24H   sevelamer  carbonate  800 mg Oral TID WC   sodium chloride  flush  10-40 mL Intracatheter Q12H  Dialysis Orders: [facility]-MWF 3.5h  B400  45.5kg  TDC  Heparin  2000 Mircera 100 mcg - last dose 12/01/23 Calcitriol  1.0 mcg (on hold due to high Ca)   Home bp meds: Midodrine  10mg  tid prn for SBP < 100 Midodrine  10mg  pre HD mwf prn  Assessment/Plan: 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 Blood cx's sent, IV vanc/ cefepime  started. Discussed with ID and pharmacy: plan for IV Cefepime  2GM and Vancomycin  500mg  with HD X 8 weeks end date 02/09/24.  ESRD: on HD MWF. Due to staffing we had to bump his HD until tomorrow. Patient is okay with that. Labs are okay. Next HD 8/16. BP: chronic low BP on midodrine  10 tid prn and 10mg  pre HD mwf as needed for SBP < 100. Can give albumin  during HD for low BP.  Volume: not a big volume gainer, limits wt gains. 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 HIV: controlled on Biktarvy  BMD: Corrected Ca 10.3 and phos 4.9. Continue sevelamer  TID WC. VDRA on hold due to high Ca.  Belvie Och, NP  [facility] 12/26/2023,10:18 AM  LOS: 11 days

## 2023-12-26 NOTE — Progress Notes (Signed)
 PROGRESS NOTE    Alvin Daniels  FMW:[id number] DOB: 1971/03/10 DOA: 12/14/2023 PCP: Pcp, No    Brief Narrative:   53 y.o. 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 coli and Streptococcus anginosus who presents with hip pain MRI was done that showed a right psoas abscess and L4-L5 discitis and osteomyelitis.  Evaluated by IR but no safe access and not a surgical candidate.  ID is recommending total 8 weeks of vancomycin  and cefepime  with HD, EOT 02/09/2024    Assessment & Plan:  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)   Severe protein-calorie malnutrition ([name])   Normocytic anemia   ESRD on hemodialysis ([name])   S/P bilateral BKA (below knee amputation) ([name])   Chronic hepatitis C without hepatic coma ([name])   HIV (human immunodeficiency virus infection) ([name])   Hx MRSA infection   Osteomyelitis of lumbar spine ([name])   Epidural abscess   Paraspinal abscess ([name])   Pyogenic inflammation of bone ([name])     L4-L5 osteomyelitis/discitis with right psoas abscess - Coverage empirically with cefepime  plus vancomycin .  MRI sacrum noting a 2.2 x 1 x 4.1 cm right psoas abscess.  Repeat MRI this morning noting discitis/osteomyelitis at L4-L5, right psoas abscess, and epidural phlegmon.  Also noting small paraspinal abscess anterior to L4-L5 as well as possible small left psoas abscess.  IR consulted for possible drainage however no safe access appreciated.  Not a surgical candidate.  Infectious disease consulted recommending conservative management with IV cefepime  plus vancomycin .  Patient to receive cefepime  plus vancomycin  with  HD for total of 8 weeks, end date 02/09/2024.   ESRD on HD MWF - Nephrology following closely.     Chronic hypotension -Renal following. Increased Midodrine .    Uncontrolled diabetes mellitus - Previous A1c 10 suggesting very poor control.  Currently on insulin  sliding scale.   HIV/chronic hep C - ID following.  Continue Biktarvy .   Severe protein calorie malnutrition - Protein supplementation encouraged.   Bilateral BKA with increased hip/leg pain - Contributing to physical debilitation and muscle weakness.  BL prosthetics in room.  Patient complaining of worsening pain with ambulation.  Physical therapy following closely.  Increased patient's pain regimen to include oxycodone  10 mg every 4 hours, lidocaine  patch, gabapentin .  Would like him to attempt to participate in ambulation as it is the limiting factor in in his discharge home.  Would need to ensure patient is able to be ambulatory prior to discharge.  May consider increasing oxycodone  or other medications.  Pain control will be obviously very difficult given infection and location.   Goals of care - Disposition planning with patient, TOC, infectious disease, nephrology.  Infectious disease consulted recommending conservative management with IV cefepime  plus vancomycin .  Patient received cefepime  plus vancomycin  with HD for total of 8 weeks, end date 02/09/2024.  Repeat MRI recommended by infectious disease as well, to be scheduled in the outpatient setting.  Working closely with TOC to ensure this is all set up prior to discharge home.  SNF not an option given patient is not a [place]  citizen.  DVT prophylaxis: heparin  injection 5,000 Units Start: 12/15/23 1400    Code Status: Full Code Family Communication:   Status is: Inpatient  Remains inpatient appropriate because: Continue hospital stay for pain control. Being considered for STAR program.     Subjective: Pain manageable at rest but reporting of significant pain with movement  especially with therapy  Examination:  General exam: Appears calm and comfortable  Respiratory system: Clear to auscultation. Respiratory effort normal. Cardiovascular system: S1 & S2 heard, RRR. No JVD, murmurs, rubs, gallops or clicks. No pedal edema. Gastrointestinal system: Abdomen is nondistended, soft and nontender. No organomegaly or masses felt. Normal bowel sounds heard. Central nervous system: Alert and oriented. No focal neurological deficits. Extremities: b/l bka. Digit amputations noted.  Skin: No rashes, lesions or ulcers Psychiatry: Judgement and insight appear normal. Mood & affect appropriate.                Diet Orders (From admission, onward)     Start     Ordered   12/16/23 1646  Diet renal/carb modified with fluid restriction Diet-HS Snack? Nothing; Fluid restriction: 1200 mL Fluid; Room service appropriate? Yes; Fluid consistency: Thin  Diet effective now       Question Answer Comment  Diet-HS Snack? Nothing   Fluid restriction: 1200 mL Fluid   Room service appropriate? Yes   Fluid consistency: Thin      12/16/23 1646            Objective: Vitals:   12/25/23 1700 12/25/23 1947 12/26/23 0457 12/26/23 0734  BP: 118/79 94/61 91/66  (!) 94/53  Pulse: 92 93 81 85  Resp:  16 16   Temp: 98.4 F (36.9 C) 98.9 F (37.2 C) 98.2 F (36.8 C) 98.1 F (36.7 C)  TempSrc: Oral Oral Oral Oral  SpO2:  100% 94% 91%  Weight:      Height:        Intake/Output Summary (Last 24 hours) at 12/26/2023 1103 Last data filed at 12/25/2023 2126 Gross per 24 hour  Intake 120 ml  Output 800 ml  Net -680 ml   Filed Weights   12/22/23 1117 12/25/23 0824 12/25/23 1205  Weight: 42 kg 42.4 kg 42 kg    Scheduled Meds:  acetaminophen   1,000 mg Oral TID   bictegravir-emtricitabine -tenofovir  AF  1 tablet Oral Daily   Chlorhexidine  Gluconate Cloth  6 each Topical Q0600   ciprofloxacin   500 mg Oral Once   gabapentin   100 mg Oral TID   heparin   5,000 Units  Subcutaneous Q8H   heparin  sodium (porcine)  4,000 Units Intracatheter Once   insulin  aspart  0-5 Units Subcutaneous QHS   insulin  aspart  0-6 Units Subcutaneous TID WC   lidocaine   1 patch Transdermal Q24H   methocarbamol   500 mg Oral TID   midodrine   10 mg Oral TID WC   morphine   15 mg Oral Q12H   oxyCODONE   15 mg Oral Q24H   sevelamer  carbonate  800 mg Oral TID WC   sodium chloride  flush  10-40 mL Intracatheter Q12H   Continuous Infusions:  [START ON 12/27/2023] ceFEPime  (MAXIPIME ) IV     [START ON 12/29/2023] ceFEPime  (MAXIPIME ) IV     [START ON 12/27/2023] vancomycin      [START ON 12/29/2023] vancomycin       Nutritional status     Body mass index is 18.7 kg/m.  Data Reviewed:   CBC: Recent Labs  Lab 12/22/23 0500 12/23/23 0702 12/24/23 0513 12/25/23 0615 12/26/23 0445  WBC 7.3 6.6 6.5 6.4 7.1  NEUTROABS 5.3  --   --   --   --   HGB 8.9*  9.4* 8.9* 8.8* 9.4*  HCT 28.3* 29.3* 27.6* 27.2* 28.9*  MCV 100.7* 99.0 98.2 99.3 99.3  PLT 227 231 226 232 269   Basic Metabolic Panel: Recent Labs  Lab 12/22/23 0811 12/23/23 0702 12/24/23 0513 12/25/23 0615 12/26/23 0445  NA 127* 130* 130* 126* 131*  K 4.0 3.7 3.9 4.1 4.2  CL 89* 93* 91* 91* 94*  CO2 21* 24 24 21* 24  GLUCOSE 162* 141* 176* 170* 121*  BUN 35* 19 27* 37* 17  CREATININE 7.40* 5.02* 6.35* 7.56* 4.64*  CALCIUM  9.1 9.1 9.3 9.2 8.9  MG  --  2.1 2.1 2.3 2.0  PHOS 5.3*  --  4.9*  --   --    GFR: Estimated Creatinine Clearance: 10.9 mL/min (A) (by C-G formula based on SCr of 4.64 mg/dL (H)). Liver Function Tests: Recent Labs  Lab 12/22/23 0811  ALBUMIN  2.3*   No results for input(s): LIPASE, AMYLASE in the last 168 hours. No results for input(s): AMMONIA in the last 168 hours. Coagulation Profile: No results for input(s): INR, PROTIME in the last 168 hours. Cardiac Enzymes: No results for input(s): CKTOTAL, CKMB, CKMBINDEX, TROPONINI in the last 168 hours. BNP (last 3 results) No  results for input(s): PROBNP in the last 8760 hours. HbA1C: No results for input(s): HGBA1C in the last 72 hours. CBG: Recent Labs  Lab 12/24/23 2146 12/25/23 0614 12/25/23 1717 12/25/23 2025 12/26/23 0735  GLUCAP 213* 187* 121* 128* 109*   Lipid Profile: No results for input(s): CHOL, HDL, LDLCALC, TRIG, CHOLHDL, LDLDIRECT in the last 72 hours. Thyroid Function Tests: No results for input(s): TSH, T4TOTAL, FREET4, T3FREE, THYROIDAB in the last 72 hours. Anemia Panel: No results for input(s): VITAMINB12, FOLATE, FERRITIN, TIBC, IRON , RETICCTPCT in the last 72 hours. Sepsis Labs: No results for input(s): PROCALCITON, LATICACIDVEN in the last 168 hours.  No results found for this or any previous visit (from the past 240 hours).       Radiology Studies: No results found.         LOS: 11 days   Time spent= 35 mins    Burgess JAYSON Dare, MD [facility]  If 7PM-7AM, please contact night-coverage  12/26/2023, 11:03 AM

## 2023-12-26 NOTE — Plan of Care (Signed)

## 2023-12-26 NOTE — Plan of Care (Signed)
   Problem: Education: Goal: Knowledge of General Education information will improve Description Including pain rating scale, medication(s)/side effects and non-pharmacologic comfort measures Outcome: Progressing

## 2023-12-26 NOTE — Progress Notes (Signed)
 Occupational Therapy Treatment Patient Details Name: Alvin Daniels MRN: [id number] DOB: May 05, 1971 Today's Date: 12/26/2023   History of present illness Patient is a 53 y/o male admitted 12/14/23 with hip pain MRI showing R psoas abscess and L4-L5 discitis and osteomyelitis. 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  PMH includes ESRD on HD MWF, hepC, MRSA, HIV, bilat BKA, DMII.   OT comments  Pt refused mobility today, c/o too much LLE pain. Educated pt on the benefits of continued mobility and risks of staying bed level. He was open to education on exercise program and verbalized that he will adhere to it when not pain. Pt denied UE pain (although reported some last session) and states that his arms are strong, discussed with pt the benefits of continued UE strengthening as well. During session pt reports times where he has attempted to get OOB but was halted by nursing, educated pt on need for safe mobility and to have nurses notify therapy team to help progress and assess his gait when being open to ambulation. OT to continue to progress pt as able. Patient would benefit from post acute Home OT services to help maximize functional independence in natural environment       If plan is discharge home, recommend the following:  Assistance with cooking/housework;A little help with bathing/dressing/bathroom   Equipment Recommendations  None recommended by OT (Pt declined additional DME)    Recommendations for Other Services      Precautions / Restrictions Precautions Precautions: Fall Recall of Precautions/Restrictions: Impaired Precaution/Restrictions Comments: B BKA; prosthetics in the room Restrictions Weight Bearing Restrictions Per Provider Order: No       Mobility Bed Mobility                    Transfers                         Balance                                            ADL either performed or assessed with clinical judgement   ADL                                         General ADL Comments: Pt declined mobility, stated that his arms are strong as so deferred UE theraband this session. He also denied pain in the UEs which differs from what he mentioned last session. Still think pt may benefit from shoulder stability exercises but will assess needs as he continues to work with therapy.    Extremity/Trunk Assessment              Vision       Restaurant manager, fast food Communication: Impaired Factors Affecting Communication: Non - English speaking, interpreter not available   Cognition Arousal: Alert Behavior During Therapy: WFL for tasks assessed/performed Cognition: No apparent impairments                               Following commands: Intact        Cueing   Cueing Techniques: Verbal cues, Visual cues  Exercises General Exercises - Lower Extremity Short Arc Quad: AROM, Both, 10 reps Hip ABduction/ADduction: AROM, Both, 20 reps Straight Leg Raises: AROM, Both, 15 reps Other Exercises Other Exercises: bed level chair push ups x10    Shoulder Instructions       General Comments      Pertinent Vitals/ Pain       Pain Assessment Pain Assessment: Faces Faces Pain Scale: Hurts even more Pain Location: left leg Pain Descriptors / Indicators: Discomfort, Aching Pain Intervention(s): Monitored during session, Repositioned, Limited activity within patient's tolerance (RN in room at end of session with pain meds)  Home Living                                          Prior Functioning/Environment              Frequency  Min 2X/week        Progress Toward Goals  OT Goals(current goals can now be found in the care plan section)  Progress towards OT goals: Not progressing toward goals - comment (pain limited)  Acute  Rehab OT Goals Patient Stated Goal: To have pain go down then return home OT Goal Formulation: With patient Time For Goal Achievement: 01/07/24 Potential to Achieve Goals: Good  Plan      Co-evaluation                 AM-PAC OT 6 Clicks Daily Activity     Outcome Measure   Help from another person eating meals?: A Little Help from another person taking care of personal grooming?: A Little Help from another person toileting, which includes using toliet, bedpan, or urinal?: A Little Help from another person bathing (including washing, rinsing, drying)?: A Little Help from another person to put on and taking off regular upper body clothing?: A Little Help from another person to put on and taking off regular lower body clothing?: A Little 6 Click Score: 18    End of Session    OT Visit Diagnosis: Pain;Other abnormalities of gait and mobility (R26.89);Unsteadiness on feet (R26.81) Pain - Right/Left: Left Pain - part of body:  (BKA)   Activity Tolerance Patient limited by pain   Patient Left in bed;with call bell/phone within reach;with bed alarm set   Nurse Communication Mobility status        Time: 8354-8293 OT Time Calculation (min): 21 min  Charges: OT General Charges $OT Visit: 1 Visit OT Treatments $Therapeutic Exercise: 8-22 mins  12/26/2023  AB, OTR/L  Acute Rehabilitation Services  Office: [phone]   Alvin Daniels 12/26/2023, 5:14 PM

## 2023-12-27 LAB — BASIC METABOLIC PANEL WITH GFR
Anion gap: 15 (ref 5–15)
BUN: 26 mg/dL — ABNORMAL HIGH (ref 6–20)
CO2: 23 mmol/L (ref 22–32)
Calcium: 9.4 mg/dL (ref 8.9–10.3)
Chloride: 92 mmol/L — ABNORMAL LOW (ref 98–111)
Creatinine, Ser: 6.3 mg/dL — ABNORMAL HIGH (ref 0.61–1.24)
GFR, Estimated: 10 mL/min — ABNORMAL LOW (ref >60)
Glucose, Bld: 144 mg/dL — ABNORMAL HIGH (ref 70–99)
Potassium: 4.7 mmol/L (ref 3.5–5.1)
Sodium: 130 mmol/L — ABNORMAL LOW (ref 135–145)

## 2023-12-27 LAB — GLUCOSE, CAPILLARY
Glucose-Capillary: 118 mg/dL — ABNORMAL HIGH (ref 70–99)
Glucose-Capillary: 218 mg/dL — ABNORMAL HIGH (ref 70–99)

## 2023-12-27 LAB — MAGNESIUM: Magnesium: 2.2 mg/dL (ref 1.7–2.4)

## 2023-12-27 LAB — CBC
HCT: 28.7 % — ABNORMAL LOW (ref 39.0–52.0)
Hemoglobin: 9.2 g/dL — ABNORMAL LOW (ref 13.0–17.0)
MCH: 31.8 pg (ref 26.0–34.0)
MCHC: 32.1 g/dL (ref 30.0–36.0)
MCV: 99.3 fL (ref 80.0–100.0)
Platelets: 329 K/uL (ref 150–400)
RBC: 2.89 MIL/uL — ABNORMAL LOW (ref 4.22–5.81)
RDW: 15.4 % (ref 11.5–15.5)
WBC: 8.3 K/uL (ref 4.0–10.5)
nRBC: 0 % (ref 0.0–0.2)

## 2023-12-27 MED ORDER — SODIUM CHLORIDE 0.9 % IV SOLN
2.0000 g | Freq: Once | INTRAVENOUS | Status: AC
  Administered 2023-12-27: 2 g via INTRAVENOUS

## 2023-12-27 MED ORDER — MIDODRINE HCL 5 MG PO TABS
ORAL_TABLET | ORAL | Status: AC
  Filled 2023-12-27: qty 2

## 2023-12-27 MED ORDER — VANCOMYCIN HCL 500 MG/100ML IV SOLN
INTRAVENOUS | Status: AC
Start: 2023-12-27 — End: 2023-12-27
  Filled 2023-12-27: qty 100

## 2023-12-27 MED ORDER — HEPARIN SODIUM (PORCINE) 1000 UNIT/ML IJ SOLN
INTRAMUSCULAR | Status: AC
  Filled 2023-12-27: qty 4

## 2023-12-27 MED ORDER — DARBEPOETIN ALFA 100 MCG/0.5ML IJ SOSY
100.0000 ug | PREFILLED_SYRINGE | INTRAMUSCULAR | Status: DC
  Administered 2024-01-02 – 2024-01-09 (×2): 100 ug via SUBCUTANEOUS
  Filled 2023-12-27 (×3): qty 0.5

## 2023-12-27 NOTE — Progress Notes (Signed)
 2 Liters placed as pt is sleeping and taking a lot of pain meds, and O2 was dipping into high 80's while asleep.

## 2023-12-27 NOTE — Progress Notes (Signed)
 [facility] Progress Note   Subjective:   Patient seen and examined at bedside during dialysis with interpreter present.  Reports pain in LE, worsened by movement.  States he has not been able to wear his prosthesis.  No other specific complaints.  Tolerating dialysis well so far.    Objective Vitals:   12/27/23 0935 12/27/23 1008 12/27/23 1028 12/27/23 1113  BP: 110/65 116/69 (!) 76/50 120/69  Pulse: 84 88 92 92  Resp: 14 14 13 14   Temp:      TempSrc:      SpO2:  100% 100% 100%  Weight:      Height:       Physical Exam General:chronically ill appearing, thin male in NAD Heart:RRR Lungs:CTAB, nml WOB on RA Abdomen:soft, NTND Extremities:no LE edema, b/l BKA Dialysis Access: [facility] in use   Filed Weights   12/25/23 0824 12/25/23 1205 12/27/23 0846  Weight: 42.4 kg 42 kg (S) 41.6 kg   No intake or output data in the 24 hours ending 12/27/23 1130  Additional Objective Labs: Basic Metabolic Panel: Recent Labs  Lab 12/22/23 0811 12/23/23 0702 12/24/23 0513 12/25/23 0615 12/26/23 0445 12/27/23 0513  NA 127*   < > 130* 126* 131* 130*  K 4.0   < > 3.9 4.1 4.2 4.7  CL 89*   < > 91* 91* 94* 92*  CO2 21*   < > 24 21* 24 23  GLUCOSE 162*   < > 176* 170* 121* 144*  BUN 35*   < > 27* 37* 17 26*  CREATININE 7.40*   < > 6.35* 7.56* 4.64* 6.30*  CALCIUM  9.1   < > 9.3 9.2 8.9 9.4  PHOS 5.3*  --  4.9*  --   --   --    < > = values in this interval not displayed.   Liver Function Tests: Recent Labs  Lab 12/22/23 0811  ALBUMIN  2.3*   CBC: Recent Labs  Lab 12/22/23 0500 12/23/23 0702 12/24/23 0513 12/25/23 0615 12/26/23 0445 12/27/23 0513  WBC 7.3 6.6 6.5 6.4 7.1 8.3  NEUTROABS 5.3  --   --   --   --   --   HGB 8.9* 9.4* 8.9* 8.8* 9.4* 9.2*  HCT 28.3* 29.3* 27.6* 27.2* 28.9* 28.7*  MCV 100.7* 99.0 98.2 99.3 99.3 99.3  PLT 227 231 226 232 269 329   Blood Culture    Component Value Date/Time   SDES BLOOD RIGHT ARM 12/15/2023 0613   SPECREQUEST   12/15/2023 0613    BOTTLES DRAWN AEROBIC AND ANAEROBIC Blood Culture adequate volume   CULT  12/15/2023 0613    NO GROWTH 5 DAYS Performed at [facility], [address], [place], [place] [address]    REPTSTATUS 12/20/2023 FINAL 12/15/2023 9386    Medications:  anticoagulant sodium citrate      ceFEPime  (MAXIPIME ) IV     [START ON 12/29/2023] ceFEPime  (MAXIPIME ) IV     vancomycin      [START ON 12/29/2023] vancomycin       acetaminophen   1,000 mg Oral TID   bictegravir-emtricitabine -tenofovir  AF  1 tablet Oral Daily   Chlorhexidine  Gluconate Cloth  6 each Topical Q0600   gabapentin   100 mg Oral TID   heparin   5,000 Units Subcutaneous Q8H   heparin  sodium (porcine)  4,000 Units Intracatheter Once   insulin  aspart  0-5 Units Subcutaneous QHS   insulin  aspart  0-6 Units Subcutaneous TID WC   lidocaine   1 patch Transdermal Q24H  methocarbamol   500 mg Oral TID   midodrine   10 mg Oral TID WC   morphine   15 mg Oral Q12H   oxyCODONE   15 mg Oral Q24H   sevelamer  carbonate  800 mg Oral TID WC   sodium chloride  flush  10-40 mL Intracatheter Q12H    Dialysis Orders: [facility]-MWF 3.5h  B400  45.5kg  TDC  Heparin  2000 Mircera 100 mcg - last dose 12/01/23 Calcitriol  1.0 mcg (on hold due to high Ca)   Home bp meds: Midodrine  10mg  tid prn for SBP < 100 Midodrine  10mg  pre HD mwf prn   Assessment/Plan: 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 Not a surgical candidate. Blood cx's sent. Plan for IV Cefepime  2GM and Vancomycin  500mg  with HD X 8 weeks end date 02/09/24. Pain management per primary team.   ESRD: on HD MWF. Off schedule today due to staffing issues.  Plan to resume regular schedule on 12/29/23.  BP: chronic low BP on midodrine  10 tid prn and 10mg  pre HD mwf as needed for SBP < 100. Can give albumin  during HD for low BP.  Volume: Does not appear volume overloaded.  UF as tolerated. A little under edw if weights correct.  Anemia of esrd: Hgb 9.2.  Ordered  Aranesp  200mcg qwk while here.  No IV iron  due to infection.  HIV: controlled on Biktarvy  BMD: Corrected Ca 10.3 and phos 4.9. Continue sevelamer  TID WC. VDRA on hold due to high Ca. Nutrition - renal diet w/fluid restrictions.  Manuelita Labella, PA-C [facility] 12/27/2023,11:30 AM  LOS: 12 days

## 2023-12-27 NOTE — Plan of Care (Signed)

## 2023-12-27 NOTE — Progress Notes (Signed)
   12/27/23 1215  Vitals  Temp 98.1 F (36.7 C)  Pulse Rate 97  Resp 16  BP 104/68  SpO2 100 %  O2 Device Room Air  Weight (S)  41 kg (Bed Weight)  Type of Weight Post-Dialysis  Post Treatment  Dialyzer Clearance Clear  Hemodialysis Intake (mL) 0 mL  Liters Processed 83.3  Fluid Removed (mL) 1500 mL  Tolerated HD Treatment Yes  Post-Hemodialysis Comments Pt. tolerated HD treatment without difficulties. Admin Medication per order. Report call to [facility] bedside Nurse   Received patient in bed to unit.  Alert and oriented.  Informed consent signed and in chart.   TX duration:3.5  Patient tolerated well.  Transported back to the room  Alert, without acute distress.  Hand-off given to patient's nurse.   Access used: Yes Access issues: No  Total UF removed: 1500 Medication(s) given: See MAR Post HD VS: See Above Grid Post HD weight: 41.0 kg   Alvin Daniels [facility]

## 2023-12-27 NOTE — Progress Notes (Signed)
 PROGRESS NOTE    Alvin Daniels  FMW:[id number] DOB: 1970/11/15 DOA: 12/14/2023 PCP: Pcp, No    Brief Narrative:   53 y.o. 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 coli and Streptococcus anginosus who presents with hip pain MRI was done that showed a right psoas abscess and L4-L5 discitis and osteomyelitis.  Evaluated by IR but no safe access and not a surgical candidate.  ID is recommending total 8 weeks of vancomycin  and cefepime  with HD, EOT 02/09/2024    Assessment & Plan:  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)   Severe protein-calorie malnutrition ([name])   Normocytic anemia   ESRD on hemodialysis ([name])   S/P bilateral BKA (below knee amputation) ([name])   Chronic hepatitis C without hepatic coma ([name])   HIV (human immunodeficiency virus infection) ([name])   Hx MRSA infection   Osteomyelitis of lumbar spine ([name])   Epidural abscess   Paraspinal abscess ([name])   Pyogenic inflammation of bone ([name])     L4-L5 osteomyelitis/discitis with right psoas abscess - Coverage empirically with cefepime  plus vancomycin .  MRI sacrum noting a 2.2 x 1 x 4.1 cm right psoas abscess.  Repeat MRI this morning noting discitis/osteomyelitis at L4-L5, right psoas abscess, and epidural phlegmon.  Also noting small paraspinal abscess anterior to L4-L5 as well as possible small left psoas abscess.  IR consulted for possible drainage however no safe access appreciated.  Not a surgical candidate.  Infectious disease consulted recommending conservative management with IV cefepime  plus vancomycin .  Patient to receive cefepime  plus vancomycin  with  HD for total of 8 weeks, end date 02/09/2024.   ESRD on HD MWF - Nephrology following closely.     Chronic hypotension -Renal following. Increased Midodrine .    Uncontrolled diabetes mellitus - Previous A1c 10 suggesting very poor control.  Currently on insulin  sliding scale.   HIV/chronic hep C - ID following.  Continue Biktarvy .   Severe protein calorie malnutrition - Protein supplementation encouraged.   Bilateral BKA with increased hip/leg pain - Contributing to physical debilitation and muscle weakness.  BL prosthetics in room.  Patient complaining of worsening pain with ambulation.  Physical therapy following closely.  Increased patient's pain regimen to include oxycodone  10 mg every 4 hours, lidocaine  patch, gabapentin .  Would like him to attempt to participate in ambulation as it is the limiting factor in in his discharge home.  Would need to ensure patient is able to be ambulatory prior to discharge.  May consider increasing oxycodone  or other medications.  Pain control will be obviously very difficult given infection and location.   Goals of care - Disposition planning with patient, TOC, infectious disease, nephrology.  Infectious disease consulted recommending conservative management with IV cefepime  plus vancomycin .  Patient received cefepime  plus vancomycin  with HD for total of 8 weeks, end date 02/09/2024.  Repeat MRI recommended by infectious disease as well, to be scheduled in the outpatient setting.  Working closely with TOC to ensure this is all set up prior to discharge home.  SNF not an option given patient is not a [place]  citizen.  DVT prophylaxis: heparin  injection 5,000 Units Start: 12/15/23 1400    Code Status: Full Code Family Communication:   Status is: Inpatient  Remains inpatient appropriate because: Continue hospital stay for pain control. Being considered for STAR program.     Subjective: Interpreter used [id number] Seen and this patient in HD with nephrology team.   Patient still reporting of his leg and hip pain especially with movement and  putting on his prosthetics.  Examination:  General exam: Appears calm and comfortable  Respiratory system: Clear to auscultation. Respiratory effort normal. Cardiovascular system: S1 & S2 heard, RRR. No JVD, murmurs, rubs, gallops or clicks. No pedal edema. Gastrointestinal system: Abdomen is nondistended, soft and nontender. No organomegaly or masses felt. Normal bowel sounds heard. Central nervous system: Alert and oriented. No focal neurological deficits. Extremities: b/l bka. Digit amputations noted.  Skin: No rashes, lesions or ulcers Psychiatry: Judgement and insight appear normal. Mood & affect appropriate.                Diet Orders (From admission, onward)     Start     Ordered   12/16/23 1646  Diet renal/carb modified with fluid restriction Diet-HS Snack? Nothing; Fluid restriction: 1200 mL Fluid; Room service appropriate? Yes; Fluid consistency: Thin  Diet effective now       Question Answer Comment  Diet-HS Snack? Nothing   Fluid restriction: 1200 mL Fluid   Room service appropriate? Yes   Fluid consistency: Thin      12/16/23 1646            Objective: Vitals:   12/27/23 0935 12/27/23 1008 12/27/23 1028 12/27/23 1113  BP: 110/65 116/69 (!) 76/50 120/69  Pulse: 84 88 92 92  Resp: 14 14 13 14   Temp:      TempSrc:      SpO2:  100% 100% 100%  Weight:      Height:       No intake or output data in the 24 hours ending 12/27/23 1125 Filed Weights   12/25/23 0824 12/25/23 1205 12/27/23 0846  Weight: 42.4 kg 42 kg (S) 41.6 kg    Scheduled Meds:  acetaminophen   1,000 mg Oral TID   bictegravir-emtricitabine -tenofovir  AF  1 tablet Oral Daily   Chlorhexidine  Gluconate Cloth  6 each Topical Q0600   gabapentin   100 mg Oral TID   heparin   5,000 Units Subcutaneous Q8H   heparin  sodium (porcine)  4,000 Units Intracatheter Once   insulin  aspart  0-5 Units Subcutaneous QHS    insulin  aspart  0-6 Units Subcutaneous TID WC   lidocaine   1 patch Transdermal Q24H   methocarbamol   500 mg Oral TID   midodrine   10 mg Oral TID WC   morphine   15 mg Oral Q12H   oxyCODONE   15 mg Oral Q24H   sevelamer  carbonate  800 mg Oral TID WC   sodium chloride  flush  10-40 mL Intracatheter Q12H   Continuous Infusions:  anticoagulant sodium citrate      ceFEPime  (MAXIPIME ) IV     [START ON 12/29/2023] ceFEPime  (MAXIPIME ) IV     vancomycin      [START ON 12/29/2023] vancomycin       Nutritional status     Body mass index is 18.52 kg/m.  Data Reviewed:   CBC: Recent Labs  Lab 12/22/23 0500 12/23/23 0702 12/24/23 0513 12/25/23 0615 12/26/23 0445 12/27/23 0513  WBC 7.3 6.6 6.5 6.4 7.1 8.3  NEUTROABS 5.3  --   --   --   --   --   HGB 8.9* 9.4* 8.9* 8.8* 9.4* 9.2*  HCT 28.3* 29.3* 27.6* 27.2* 28.9* 28.7*  MCV 100.7*  99.0 98.2 99.3 99.3 99.3  PLT 227 231 226 232 269 329   Basic Metabolic Panel: Recent Labs  Lab 12/22/23 0811 12/23/23 0702 12/24/23 0513 12/25/23 0615 12/26/23 0445 12/27/23 0513  NA 127* 130* 130* 126* 131* 130*  K 4.0 3.7 3.9 4.1 4.2 4.7  CL 89* 93* 91* 91* 94* 92*  CO2 21* 24 24 21* 24 23  GLUCOSE 162* 141* 176* 170* 121* 144*  BUN 35* 19 27* 37* 17 26*  CREATININE 7.40* 5.02* 6.35* 7.56* 4.64* 6.30*  CALCIUM  9.1 9.1 9.3 9.2 8.9 9.4  MG  --  2.1 2.1 2.3 2.0 2.2  PHOS 5.3*  --  4.9*  --   --   --    GFR: Estimated Creatinine Clearance: 8 mL/min (A) (by C-G formula based on SCr of 6.3 mg/dL (H)). Liver Function Tests: Recent Labs  Lab 12/22/23 0811  ALBUMIN  2.3*   No results for input(s): LIPASE, AMYLASE in the last 168 hours. No results for input(s): AMMONIA in the last 168 hours. Coagulation Profile: No results for input(s): INR, PROTIME in the last 168 hours. Cardiac Enzymes: No results for input(s): CKTOTAL, CKMB, CKMBINDEX, TROPONINI in the last 168 hours. BNP (last 3 results) No results for input(s): PROBNP in  the last 8760 hours. HbA1C: No results for input(s): HGBA1C in the last 72 hours. CBG: Recent Labs  Lab 12/25/23 2025 12/26/23 0735 12/26/23 1153 12/26/23 1651 12/26/23 2031  GLUCAP 128* 109* 142* 162* 121*   Lipid Profile: No results for input(s): CHOL, HDL, LDLCALC, TRIG, CHOLHDL, LDLDIRECT in the last 72 hours. Thyroid Function Tests: No results for input(s): TSH, T4TOTAL, FREET4, T3FREE, THYROIDAB in the last 72 hours. Anemia Panel: No results for input(s): VITAMINB12, FOLATE, FERRITIN, TIBC, IRON , RETICCTPCT in the last 72 hours. Sepsis Labs: No results for input(s): PROCALCITON, LATICACIDVEN in the last 168 hours.  No results found for this or any previous visit (from the past 240 hours).       Radiology Studies: No results found.         LOS: 12 days   Time spent= 35 mins    Burgess JAYSON Dare, MD [facility]  If 7PM-7AM, please contact night-coverage  12/27/2023, 11:25 AM

## 2023-12-27 NOTE — Progress Notes (Signed)
 Pt took O2 off right after it was placed. Education provided. Encouraged to wear while sleeping. Room air 88%

## 2023-12-28 LAB — BASIC METABOLIC PANEL WITH GFR
Anion gap: 15 (ref 5–15)
BUN: 15 mg/dL (ref 6–20)
CO2: 23 mmol/L (ref 22–32)
Calcium: 9.1 mg/dL (ref 8.9–10.3)
Chloride: 94 mmol/L — ABNORMAL LOW (ref 98–111)
Creatinine, Ser: 4.13 mg/dL — ABNORMAL HIGH (ref 0.61–1.24)
GFR, Estimated: 16 mL/min — ABNORMAL LOW (ref >60)
Glucose, Bld: 160 mg/dL — ABNORMAL HIGH (ref 70–99)
Potassium: 4 mmol/L (ref 3.5–5.1)
Sodium: 132 mmol/L — ABNORMAL LOW (ref 135–145)

## 2023-12-28 LAB — GLUCOSE, CAPILLARY
Glucose-Capillary: 181 mg/dL — ABNORMAL HIGH (ref 70–99)
Glucose-Capillary: 175 mg/dL — ABNORMAL HIGH (ref 70–99)
Glucose-Capillary: 205 mg/dL — ABNORMAL HIGH (ref 70–99)
Glucose-Capillary: 204 mg/dL — ABNORMAL HIGH (ref 70–99)

## 2023-12-28 MED ORDER — CHLORHEXIDINE GLUCONATE CLOTH 2 % EX PADS
6.0000 | MEDICATED_PAD | Freq: Every day | CUTANEOUS | Status: DC
  Administered 2023-12-29 – 2023-12-31 (×3): 6 via TOPICAL

## 2023-12-28 NOTE — Plan of Care (Signed)

## 2023-12-28 NOTE — Progress Notes (Signed)
 PROGRESS NOTE    Alvin Daniels  FMW:[id number] DOB: 01-16-1971 DOA: 12/14/2023 PCP: Pcp, No    Brief Narrative:   53 y.o. 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 coli and Streptococcus anginosus who presents with hip pain MRI was done that showed a right psoas abscess and L4-L5 discitis and osteomyelitis.  Evaluated by IR but no safe access and not a surgical candidate.  ID is recommending total 8 weeks of vancomycin  and cefepime  with HD, EOT 02/09/2024    Assessment & Plan:  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)   Severe protein-calorie malnutrition ([name])   Normocytic anemia   ESRD on hemodialysis ([name])   S/P bilateral BKA (below knee amputation) ([name])   Chronic hepatitis C without hepatic coma ([name])   HIV (human immunodeficiency virus infection) ([name])   Hx MRSA infection   Osteomyelitis of lumbar spine ([name])   Epidural abscess   Paraspinal abscess ([name])   Pyogenic inflammation of bone ([name])     L4-L5 osteomyelitis/discitis with right psoas abscess -MRI sacrum noting a 2.2 x 1 x 4.1 cm right psoas abscess.  Repeat MRI this morning noting discitis/osteomyelitis at L4-L5, right psoas abscess, and epidural phlegmon.  Also noting small paraspinal abscess anterior to L4-L5 as well as possible small left psoas abscess.  IR consulted for possible drainage however no safe access appreciated.  Not a surgical candidate.  -ID recommendations for patient to receive cefepime  and vancomycin  with HD for total of 2 weeks, EOT 02/09/2024   ESRD on HD MWF - Nephrology following closely.     Chronic hypotension -Renal  following. Increased Midodrine .    Uncontrolled diabetes mellitus - Previous A1c 10 suggesting very poor control.  Currently on insulin  sliding scale.   HIV/chronic hep C - ID following.  Continue Biktarvy .   Severe protein calorie malnutrition - Protein supplementation encouraged.   Bilateral BKA with increased hip/leg pain - Contributing to physical debilitation and muscle weakness.  BL prosthetics in room.  Patient complaining of worsening pain with ambulation.   -Ongoing pain management.    MS Contin  15 mg every 12 hours  oxycodone  15 mg IR every morning before working with therapy  oxycodone  IR 15 mg every 6 as needed. Robaxin  500 mg 3 times daily Gabapentin  100 mg 3 times daily     Goals of care - Disposition planning with patient, TOC, infectious disease, nephrology.  Infectious disease consulted recommending conservative management with IV cefepime  plus vancomycin .  Patient received cefepime  plus vancomycin  with HD for total of 8 weeks, end date 02/09/2024.  Repeat MRI recommended by infectious disease as well, to be scheduled in the outpatient setting.  Working closely with TOC to ensure this is all set up prior to discharge home.  SNF not an option given patient is not a [place]  citizen.  DVT prophylaxis: heparin  injection 5,000 Units Start: 12/15/23 1400    Code Status: Full Code Family Communication:   Status is: Inpatient Remains inpatient appropriate because: Continue hospital stay for pain control. Being considered for STAR program.     Subjective: Comfortable when I walked in the room but thereafter still continues to report of his hip and limb pain.  Examination:  General exam: Appears  calm and comfortable  Respiratory system: Clear to auscultation. Respiratory effort normal. Cardiovascular system: S1 & S2 heard, RRR. No JVD, murmurs, rubs, gallops or clicks. No pedal edema. Gastrointestinal system: Abdomen is nondistended, soft and nontender. No organomegaly or  masses felt. Normal bowel sounds heard. Central nervous system: Alert and oriented. No focal neurological deficits. Extremities: b/l bka. Digit amputations noted.  Skin: No rashes, lesions or ulcers Psychiatry: Judgement and insight appear normal. Mood & affect appropriate.                Diet Orders (From admission, onward)     Start     Ordered   12/16/23 1646  Diet renal/carb modified with fluid restriction Diet-HS Snack? Nothing; Fluid restriction: 1200 mL Fluid; Room service appropriate? Yes; Fluid consistency: Thin  Diet effective now       Question Answer Comment  Diet-HS Snack? Nothing   Fluid restriction: 1200 mL Fluid   Room service appropriate? Yes   Fluid consistency: Thin      12/16/23 1646            Objective: Vitals:   12/27/23 2101 12/27/23 2352 12/28/23 0423 12/28/23 0803  BP: 122/72 136/79 (!) 133/91 130/74  Pulse:   89 100  Resp: 18 18  18   Temp: 98.5 F (36.9 C) 99 F (37.2 C) 99 F (37.2 C) 98.9 F (37.2 C)  TempSrc: Oral Oral Oral Oral  SpO2:   99% 99%  Weight:      Height:        Intake/Output Summary (Last 24 hours) at 12/28/2023 1038 Last data filed at 12/28/2023 0900 Gross per 24 hour  Intake 780 ml  Output 1500 ml  Net -720 ml   Filed Weights   12/25/23 1205 12/27/23 0846 12/27/23 1215  Weight: 42 kg (S) 41.6 kg (S) 41 kg    Scheduled Meds:  acetaminophen   1,000 mg Oral TID   bictegravir-emtricitabine -tenofovir  AF  1 tablet Oral Daily   Chlorhexidine  Gluconate Cloth  6 each Topical Q0600   [START ON 01/02/2024] darbepoetin (ARANESP ) injection - DIALYSIS  100 mcg Subcutaneous Q Fri-1800   gabapentin   100 mg Oral TID   heparin   5,000 Units Subcutaneous Q8H   heparin  sodium (porcine)  4,000 Units Intracatheter Once   insulin  aspart  0-5 Units Subcutaneous QHS   insulin  aspart  0-6 Units Subcutaneous TID WC   lidocaine   1 patch Transdermal Q24H   methocarbamol   500 mg Oral TID   midodrine   10 mg Oral TID WC   morphine    15 mg Oral Q12H   oxyCODONE   15 mg Oral Q24H   sevelamer  carbonate  800 mg Oral TID WC   sodium chloride  flush  10-40 mL Intracatheter Q12H   Continuous Infusions:  anticoagulant sodium citrate      [START ON 12/29/2023] ceFEPime  (MAXIPIME ) IV     [START ON 12/29/2023] vancomycin       Nutritional status     Body mass index is 18.26 kg/m.  Data Reviewed:   CBC: Recent Labs  Lab 12/22/23 0500 12/23/23 0702 12/24/23 0513 12/25/23 0615 12/26/23 0445 12/27/23 0513  WBC 7.3 6.6 6.5 6.4 7.1 8.3  NEUTROABS 5.3  --   --   --   --   --   HGB 8.9* 9.4* 8.9* 8.8* 9.4* 9.2*  HCT 28.3* 29.3* 27.6* 27.2* 28.9* 28.7*  MCV 100.7* 99.0 98.2 99.3 99.3 99.3  PLT 227 231 226 232 269 329   Basic Metabolic Panel: Recent  Labs  Lab 12/22/23 0811 12/23/23 0702 12/24/23 0513 12/25/23 0615 12/26/23 0445 12/27/23 0513 12/28/23 0450  NA 127* 130* 130* 126* 131* 130* 132*  K 4.0 3.7 3.9 4.1 4.2 4.7 4.0  CL 89* 93* 91* 91* 94* 92* 94*  CO2 21* 24 24 21* 24 23 23   GLUCOSE 162* 141* 176* 170* 121* 144* 160*  BUN 35* 19 27* 37* 17 26* 15  CREATININE 7.40* 5.02* 6.35* 7.56* 4.64* 6.30* 4.13*  CALCIUM  9.1 9.1 9.3 9.2 8.9 9.4 9.1  MG  --  2.1 2.1 2.3 2.0 2.2  --   PHOS 5.3*  --  4.9*  --   --   --   --    GFR: Estimated Creatinine Clearance: 12 mL/min (A) (by C-G formula based on SCr of 4.13 mg/dL (H)). Liver Function Tests: Recent Labs  Lab 12/22/23 0811  ALBUMIN  2.3*   No results for input(s): LIPASE, AMYLASE in the last 168 hours. No results for input(s): AMMONIA in the last 168 hours. Coagulation Profile: No results for input(s): INR, PROTIME in the last 168 hours. Cardiac Enzymes: No results for input(s): CKTOTAL, CKMB, CKMBINDEX, TROPONINI in the last 168 hours. BNP (last 3 results) No results for input(s): PROBNP in the last 8760 hours. HbA1C: No results for input(s): HGBA1C in the last 72 hours. CBG: Recent Labs  Lab 12/26/23 1651 12/26/23 2031  12/27/23 1606 12/27/23 2148 12/28/23 0811  GLUCAP 162* 121* 118* 218* 181*   Lipid Profile: No results for input(s): CHOL, HDL, LDLCALC, TRIG, CHOLHDL, LDLDIRECT in the last 72 hours. Thyroid Function Tests: No results for input(s): TSH, T4TOTAL, FREET4, T3FREE, THYROIDAB in the last 72 hours. Anemia Panel: No results for input(s): VITAMINB12, FOLATE, FERRITIN, TIBC, IRON , RETICCTPCT in the last 72 hours. Sepsis Labs: No results for input(s): PROCALCITON, LATICACIDVEN in the last 168 hours.  No results found for this or any previous visit (from the past 240 hours).       Radiology Studies: No results found.         LOS: 13 days   Time spent= 35 mins    Burgess JAYSON Dare, MD [facility]  If 7PM-7AM, please contact night-coverage  12/28/2023, 10:38 AM

## 2023-12-28 NOTE — Progress Notes (Signed)
 [facility] Progress Note   Subjective:   Patient seen and examined at bedside.  Complains of pain in leg.  No other complaints.  No acute events overnight.  Tolerated dialysis well yesterday.   Objective Vitals:   12/27/23 2352 12/28/23 0423 12/28/23 0803 12/28/23 1204  BP: 136/79 (!) 133/91 130/74 101/70  Pulse:  89 100 90  Resp: 18  18 18   Temp: 99 F (37.2 C) 99 F (37.2 C) 98.9 F (37.2 C) 98.2 F (36.8 C)  TempSrc: Oral Oral Oral Oral  SpO2:  99% 99% 91%  Weight:      Height:       Physical Exam General:chronically ill appearing male in NAD Heart:RRR Lungs:nml WOB on RA Abdomen:soft, ND Extremities:b/l BKA, no edema Dialysis Access: [facility]   Filed Weights   12/25/23 1205 12/27/23 0846 12/27/23 1215  Weight: 42 kg (S) 41.6 kg (S) 41 kg    Intake/Output Summary (Last 24 hours) at 12/28/2023 1453 Last data filed at 12/28/2023 1300 Gross per 24 hour  Intake 720 ml  Output --  Net 720 ml    Additional Objective Labs: Basic Metabolic Panel: Recent Labs  Lab 12/22/23 0811 12/23/23 0702 12/24/23 0513 12/25/23 0615 12/26/23 0445 12/27/23 0513 12/28/23 0450  NA 127*   < > 130*   < > 131* 130* 132*  K 4.0   < > 3.9   < > 4.2 4.7 4.0  CL 89*   < > 91*   < > 94* 92* 94*  CO2 21*   < > 24   < > 24 23 23   GLUCOSE 162*   < > 176*   < > 121* 144* 160*  BUN 35*   < > 27*   < > 17 26* 15  CREATININE 7.40*   < > 6.35*   < > 4.64* 6.30* 4.13*  CALCIUM  9.1   < > 9.3   < > 8.9 9.4 9.1  PHOS 5.3*  --  4.9*  --   --   --   --    < > = values in this interval not displayed.   Liver Function Tests: Recent Labs  Lab 12/22/23 0811  ALBUMIN  2.3*   CBC: Recent Labs  Lab 12/22/23 0500 12/23/23 0702 12/24/23 0513 12/25/23 0615 12/26/23 0445 12/27/23 0513  WBC 7.3 6.6 6.5 6.4 7.1 8.3  NEUTROABS 5.3  --   --   --   --   --   HGB 8.9* 9.4* 8.9* 8.8* 9.4* 9.2*  HCT 28.3* 29.3* 27.6* 27.2* 28.9* 28.7*  MCV 100.7* 99.0 98.2 99.3 99.3 99.3  PLT 227 231  226 232 269 329   Blood Culture    Component Value Date/Time   SDES BLOOD RIGHT ARM 12/15/2023 0613   SPECREQUEST  12/15/2023 0613    BOTTLES DRAWN AEROBIC AND ANAEROBIC Blood Culture adequate volume   CULT  12/15/2023 0613    NO GROWTH 5 DAYS Performed at [facility], [address] [place], [place] [address]    REPTSTATUS 12/20/2023 FINAL 12/15/2023 9386    Medications:  anticoagulant sodium citrate      [START ON 12/29/2023] ceFEPime  (MAXIPIME ) IV     [START ON 12/29/2023] vancomycin       acetaminophen   1,000 mg Oral TID   bictegravir-emtricitabine -tenofovir  AF  1 tablet Oral Daily   Chlorhexidine  Gluconate Cloth  6 each Topical Q0600   [START ON 01/02/2024] darbepoetin (ARANESP ) injection - DIALYSIS  100 mcg Subcutaneous Q Fri-1800  gabapentin   100 mg Oral TID   heparin   5,000 Units Subcutaneous Q8H   heparin  sodium (porcine)  4,000 Units Intracatheter Once   insulin  aspart  0-5 Units Subcutaneous QHS   insulin  aspart  0-6 Units Subcutaneous TID WC   lidocaine   1 patch Transdermal Q24H   methocarbamol   500 mg Oral TID   midodrine   10 mg Oral TID WC   morphine   15 mg Oral Q12H   oxyCODONE   15 mg Oral Q24H   sevelamer  carbonate  800 mg Oral TID WC   sodium chloride  flush  10-40 mL Intracatheter Q12H    Dialysis Orders: [facility] 3.5h  B400  45.5kg  TDC  Heparin  2000 Mircera 100 mcg - last dose 12/01/23 Calcitriol  1.0 mcg (on hold due to high Ca)   Home bp meds: Midodrine  10mg  tid prn for SBP < 100 Midodrine  10mg  pre HD mwf prn   Assessment/Plan: 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 Not a surgical candidate. Blood cx's sent. Plan for IV Cefepime  2GM and Vancomycin  500mg  with HD X 8 weeks end date 02/09/24. Pain management per primary team.   ESRD: on HD MWF.  Next HD 12/29/23.  BP: chronic low BP on midodrine  10 tid prn and 10mg  pre HD mwf as needed for SBP < 100. Can give albumin  during HD for low BP.  Volume: Does not appear volume  overloaded.   UF as tolerated.  Weight loss.Post weight 41kg. Anemia of esrd: Hgb 9.2.  Ordered Aranesp  200mcg qwk while here.  No IV iron  due to infection.  HIV: controlled on Biktarvy  BMD: Corrected Ca 10.3 and phos 4.9. Continue sevelamer  TID WC. VDRA on hold due to high Ca. Nutrition - renal diet w/fluid restrictions.  Manuelita Labella, PA-C [facility] 12/28/2023,2:53 PM  LOS: 13 days

## 2023-12-29 ENCOUNTER — Other Ambulatory Visit: Payer: Self-pay

## 2023-12-29 LAB — BASIC METABOLIC PANEL WITH GFR
Anion gap: 15 (ref 5–15)
BUN: 25 mg/dL — ABNORMAL HIGH (ref 6–20)
CO2: 23 mmol/L (ref 22–32)
Calcium: 9.5 mg/dL (ref 8.9–10.3)
Chloride: 92 mmol/L — ABNORMAL LOW (ref 98–111)
Creatinine, Ser: 5.68 mg/dL — ABNORMAL HIGH (ref 0.61–1.24)
GFR, Estimated: 11 mL/min — ABNORMAL LOW (ref >60)
Glucose, Bld: 168 mg/dL — ABNORMAL HIGH (ref 70–99)
Potassium: 4.6 mmol/L (ref 3.5–5.1)
Sodium: 130 mmol/L — ABNORMAL LOW (ref 135–145)

## 2023-12-29 LAB — VANCOMYCIN, RANDOM: Vancomycin Rm: 16 ug/mL

## 2023-12-29 LAB — GLUCOSE, CAPILLARY
Glucose-Capillary: 174 mg/dL — ABNORMAL HIGH (ref 70–99)
Glucose-Capillary: 137 mg/dL — ABNORMAL HIGH (ref 70–99)
Glucose-Capillary: 202 mg/dL — ABNORMAL HIGH (ref 70–99)
Glucose-Capillary: 117 mg/dL — ABNORMAL HIGH (ref 70–99)

## 2023-12-29 MED ORDER — SODIUM CHLORIDE 0.9 % IV SOLN
1.0000 g | Freq: Once | INTRAVENOUS | Status: AC
  Administered 2023-12-29: 1 g via INTRAVENOUS
  Filled 2023-12-29: qty 10

## 2023-12-29 NOTE — Progress Notes (Signed)
 PROGRESS NOTE    Alvin Daniels  FMW:[id number] DOB: 1971/05/05 DOA: 12/14/2023 PCP: Pcp, No    Brief Narrative:   53 y.o. 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 coli and Streptococcus anginosus who presents with hip pain MRI was done that showed a right psoas abscess and L4-L5 discitis and osteomyelitis.  Evaluated by IR but no safe access and not a surgical candidate.  ID is recommending total 8 weeks of vancomycin  and cefepime  with HD, EOT 02/09/2024    Assessment & Plan:  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)   Severe protein-calorie malnutrition ([name])   Normocytic anemia   ESRD on hemodialysis ([name])   S/P bilateral BKA (below knee amputation) ([name])   Chronic hepatitis C without hepatic coma ([name])   HIV (human immunodeficiency virus infection) ([name])   Hx MRSA infection   Osteomyelitis of lumbar spine ([name])   Epidural abscess   Paraspinal abscess ([name])   Pyogenic inflammation of bone ([name])     L4-L5 osteomyelitis/discitis with right psoas abscess -MRI sacrum noting a 2.2 x 1 x 4.1 cm right psoas abscess.  Repeat MRI this morning noting discitis/osteomyelitis at L4-L5, right psoas abscess, and epidural phlegmon.  Also noting small paraspinal abscess anterior to L4-L5 as well as possible small left psoas abscess.  IR consulted for possible drainage however no safe access appreciated.  Not a surgical candidate.  -ID recommendations for patient to receive cefepime  and vancomycin  with HD for total of 2 weeks, EOT 02/09/2024   ESRD on HD MWF - Nephrology following closely.     Chronic hypotension -Renal  following. Increased Midodrine .    Uncontrolled diabetes mellitus - Previous A1c 10 suggesting very poor control.  Currently on insulin  sliding scale.   HIV/chronic hep C - ID following.  Continue Biktarvy .   Severe protein calorie malnutrition - Protein supplementation encouraged.   Bilateral BKA with increased hip/leg pain - Contributing to physical debilitation and muscle weakness.  BL prosthetics in room.  Patient complaining of worsening pain with ambulation.   -Ongoing pain management.    MS Contin  15 mg every 12 hours  oxycodone  15 mg IR every morning before working with therapy  oxycodone  IR 15 mg every 6 as needed. Robaxin  500 mg 3 times daily Gabapentin  100 mg 3 times daily     Goals of care - Disposition planning with patient, TOC, infectious disease, nephrology.  Infectious disease consulted recommending conservative management with IV cefepime  plus vancomycin .  Patient received cefepime  plus vancomycin  with HD for total of 8 weeks, end date 02/09/2024.  Repeat MRI recommended by infectious disease as well, to be scheduled in the outpatient setting.  Working closely with TOC to ensure this is all set up prior to discharge home.  SNF not an option given patient is not a [place]  citizen.  DVT prophylaxis: heparin  injection 5,000 Units Start: 12/15/23 1400    Code Status: Full Code Family Communication:   Status is: Inpatient Remains inpatient appropriate because: Continue hospital stay for pain control. Being considered for [facility] program.     Subjective: Interpreter ID [id number]  When I walked in the room he was watching TV and appeared quite comfortable.  When I asked him regarding his  pain he started explaining he has significant pain during my visit and was asking for pain medications.  He continued to question that he does not want to be discharged until his pain has resolved.  I explained to him that some of this pain will be chronic and at this time we are somewhat limited  by his blood pressure to continue to increase his pain medication.  Examination:  General exam: Appears calm and comfortable  Respiratory system: Clear to auscultation. Respiratory effort normal. Cardiovascular system: S1 & S2 heard, RRR. No JVD, murmurs, rubs, gallops or clicks. No pedal edema. Gastrointestinal system: Abdomen is nondistended, soft and nontender. No organomegaly or masses felt. Normal bowel sounds heard. Central nervous system: Alert and oriented. No focal neurological deficits. Extremities: b/l bka. Digit amputations noted.  Skin: No rashes, lesions or ulcers Psychiatry: Judgement and insight appear normal. Mood & affect appropriate.                Diet Orders (From admission, onward)     Start     Ordered   12/16/23 1646  Diet renal/carb modified with fluid restriction Diet-HS Snack? Nothing; Fluid restriction: 1200 mL Fluid; Room service appropriate? Yes; Fluid consistency: Thin  Diet effective now       Question Answer Comment  Diet-HS Snack? Nothing   Fluid restriction: 1200 mL Fluid   Room service appropriate? Yes   Fluid consistency: Thin      12/16/23 1646            Objective: Vitals:   12/28/23 1642 12/28/23 2100 12/29/23 0000 12/29/23 0835  BP: (!) 158/91 107/68 112/70 113/72  Pulse: 93 86 100 95  Resp:  14 12   Temp: 98.1 F (36.7 C) 99.3 F (37.4 C) 98.7 F (37.1 C) 98.4 F (36.9 C)  TempSrc: Oral Axillary Axillary   SpO2: 98% 98% 90% 97%  Weight:      Height:        Intake/Output Summary (Last 24 hours) at 12/29/2023 1156 Last data filed at 12/28/2023 1300 Gross per 24 hour  Intake 240 ml  Output --  Net 240 ml   Filed Weights   12/25/23 1205 12/27/23 0846 12/27/23 1215  Weight: 42 kg (S) 41.6 kg (S) 41 kg    Scheduled Meds:  acetaminophen   1,000 mg Oral TID   bictegravir-emtricitabine -tenofovir  AF  1 tablet Oral Daily   Chlorhexidine  Gluconate Cloth  6 each Topical Q0600   [START ON 01/02/2024] darbepoetin  (ARANESP ) injection - DIALYSIS  100 mcg Subcutaneous Q Fri-1800   gabapentin   100 mg Oral TID   heparin   5,000 Units Subcutaneous Q8H   heparin  sodium (porcine)  4,000 Units Intracatheter Once   insulin  aspart  0-5 Units Subcutaneous QHS   insulin  aspart  0-6 Units Subcutaneous TID WC   lidocaine   1 patch Transdermal Q24H   methocarbamol   500 mg Oral TID   midodrine   10 mg Oral TID WC   morphine   15 mg Oral Q12H   oxyCODONE   15 mg Oral Q24H   sevelamer  carbonate  800 mg Oral TID WC   sodium chloride  flush  10-40 mL Intracatheter Q12H   Continuous Infusions:  ceFEPime  (MAXIPIME ) IV     vancomycin       Nutritional status     Body mass index is 18.26 kg/m.  Data Reviewed:   CBC: Recent Labs  Lab 12/23/23 0702 12/24/23 0513 12/25/23 0615 12/26/23 0445 12/27/23 0513  WBC 6.6 6.5 6.4 7.1 8.3  HGB 9.4* 8.9* 8.8* 9.4* 9.2*  HCT 29.3* 27.6* 27.2* 28.9* 28.7*  MCV 99.0 98.2 99.3 99.3 99.3  PLT 231 226 232 269 329   Basic Metabolic Panel: Recent Labs  Lab 12/23/23 0702 12/24/23 0513 12/25/23 0615 12/26/23 0445 12/27/23 0513 12/28/23 0450 12/29/23 0820  NA 130* 130* 126* 131* 130* 132* 130*  K 3.7 3.9 4.1 4.2 4.7 4.0 4.6  CL 93* 91* 91* 94* 92* 94* 92*  CO2 24 24 21* 24 23 23 23   GLUCOSE 141* 176* 170* 121* 144* 160* 168*  BUN 19 27* 37* 17 26* 15 25*  CREATININE 5.02* 6.35* 7.56* 4.64* 6.30* 4.13* 5.68*  CALCIUM  9.1 9.3 9.2 8.9 9.4 9.1 9.5  MG 2.1 2.1 2.3 2.0 2.2  --   --   PHOS  --  4.9*  --   --   --   --   --    GFR: Estimated Creatinine Clearance: 8.7 mL/min (A) (by C-G formula based on SCr of 5.68 mg/dL (H)). Liver Function Tests: No results for input(s): AST, ALT, ALKPHOS, BILITOT, PROT, ALBUMIN  in the last 168 hours. No results for input(s): LIPASE, AMYLASE in the last 168 hours. No results for input(s): AMMONIA in the last 168 hours. Coagulation Profile: No results for input(s): INR, PROTIME in the last 168 hours. Cardiac  Enzymes: No results for input(s): CKTOTAL, CKMB, CKMBINDEX, TROPONINI in the last 168 hours. BNP (last 3 results) No results for input(s): PROBNP in the last 8760 hours. HbA1C: No results for input(s): HGBA1C in the last 72 hours. CBG: Recent Labs  Lab 12/28/23 0811 12/28/23 1202 12/28/23 1642 12/28/23 2150 12/29/23 0842  GLUCAP 181* 175* 205* 204* 174*   Lipid Profile: No results for input(s): CHOL, HDL, LDLCALC, TRIG, CHOLHDL, LDLDIRECT in the last 72 hours. Thyroid Function Tests: No results for input(s): TSH, T4TOTAL, FREET4, T3FREE, THYROIDAB in the last 72 hours. Anemia Panel: No results for input(s): VITAMINB12, FOLATE, FERRITIN, TIBC, IRON , RETICCTPCT in the last 72 hours. Sepsis Labs: No results for input(s): PROCALCITON, LATICACIDVEN in the last 168 hours.  No results found for this or any previous visit (from the past 240 hours).       Radiology Studies: No results found.         LOS: 14 days   Time spent= 35 mins    Burgess JAYSON Dare, MD [facility]  If 7PM-7AM, please contact night-coverage  12/29/2023, 11:56 AM

## 2023-12-29 NOTE — Progress Notes (Addendum)
 Pt called for pain meds, this RN went in to give them and he was asleep. Called again - gave pain meds

## 2023-12-29 NOTE — Progress Notes (Signed)
 Walked into room to do vitals and pt was 67%.  Applied 2 Liters and it came up to 90 quickly

## 2023-12-29 NOTE — Addendum Note (Signed)
 Encounter addended by: Janice Lynwood BROCKS on: 12/29/2023 10:49 AM  Actions taken: Imaging Exam ended

## 2023-12-29 NOTE — Progress Notes (Addendum)
 Physical Therapy Treatment Patient Details Name: Alvin Daniels MRN: [id number] DOB: 07/15/1970 Today's Date: 12/29/2023   History of Present Illness Patient is a 53 y/o male admitted 12/14/23 with hip pain MRI showing R psoas abscess and L4-L5 discitis and osteomyelitis. 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  PMH includes ESRD on HD MWF, hepC, MRSA, HIV, bilat BKA, DMII.    PT Comments  Therapy goal for today was to get to the dialysis chair. The patient was agreeable to try mobilizing after maximal encouragement. He was able to stand x 3 bouts with CGA using the rolling walker. He is unable to stand fully due to left hip discomfort. Trial of standing with both prosthesis versus only the right prosthesis to see if pain was better without weight bearing on the left leg. Patient reports continued pain despite modifications. He declined to get to the dialysis chair despite encouragement. Slow progress overall. Discussed with Dr Caleen. Recommend to continue PT to maximize independence and decrease caregiver burden.    If plan is discharge home, recommend the following: A little help with walking and/or transfers;Help with stairs or ramp for entrance;Assist for transportation   Can travel by private vehicle        Equipment Recommendations  None recommended by PT    Recommendations for Other Services       Precautions / Restrictions Precautions Precautions: Fall Recall of Precautions/Restrictions: Impaired Precaution/Restrictions Comments: B BKA; prosthetics in the room Restrictions Weight Bearing Restrictions Per Provider Order: No     Mobility  Bed Mobility Overal bed mobility: Needs Assistance Bed Mobility: Sit to Supine       Sit to supine: Min assist   General bed mobility comments: patient long sitting in bed on arrival to room. he is able to scoot towards edge of bed using upper body with  supervsion. prosthesis donned with maximal assistance from therapist. Min A required for return to bed due to pain    Transfers Overall transfer level: Needs assistance Equipment used: Rolling walker (2 wheels) Transfers: Sit to/from Stand Sit to Stand: Contact guard assist           General transfer comment: 3 standing bouts performed. patient relying heavily on upper body support to clear buttocks. cues for increased weight acceptance on RLE for pain control. patient was agreeable to stand once with only the right prosthesis with no decrease in pain reported. he is unable to stand fully as he reports increased left hip pain with hip extension. he declined getting up to the dialysis chair today despite encouragement. he is concerned he will be unable to tolerate sitting upright despite education on the reclining function of the chair.    Ambulation/Gait             Pre-gait activities: patient has difficulty weight shifting with limited standing tolerance. unable to progress walking this date.     Stairs             Wheelchair Mobility     Tilt Bed    Modified Rankin (Stroke Patients Only)       Balance Overall balance assessment: Needs assistance Sitting-balance support: Feet unsupported, Bilateral upper extremity supported Sitting balance-Leahy Scale: Fair     Standing balance support: Bilateral upper extremity supported Standing balance-Leahy Scale: Poor Standing balance comment: heavy reliance on rolling walker for support  Communication Communication Communication: Impaired Factors Affecting Communication:  (AVS langage interpreter used throughout ID #[id number])  Cognition Arousal: Alert Behavior During Therapy: WFL for tasks assessed/performed   PT - Cognitive impairments: Safety/Judgement                       PT - Cognition Comments: patient has limited insight into deficits and importance of  mobilizing with therapist assistance. education provided. he needs encouragement to participate and is not always receptive to therapy instructions Following commands: Intact      Cueing Cueing Techniques: Verbal cues  Exercises      General Comments        Pertinent Vitals/Pain Pain Assessment Pain Assessment: Faces Faces Pain Scale: Hurts even more Pain Location: lower back and left hip Pain Descriptors / Indicators: Discomfort, Sore Pain Intervention(s): RN gave pain meds during session, Premedicated before session, Limited activity within patient's tolerance, Monitored during session, Repositioned    Home Living                          Prior Function            PT Goals (current goals can now be found in the care plan section) Acute Rehab PT Goals Patient Stated Goal: to improve pain to walk PT Goal Formulation: With patient Time For Goal Achievement: 01/03/24 Potential to Achieve Goals: Good Progress towards PT goals: Progressing toward goals    Frequency    Min 2X/week      PT Plan      Co-evaluation              AM-PAC PT 6 Clicks Mobility   Outcome Measure  Help needed turning from your back to your side while in a flat bed without using bedrails?: None Help needed moving from lying on your back to sitting on the side of a flat bed without using bedrails?: A Lot Help needed moving to and from a bed to a chair (including a wheelchair)?: A Little Help needed standing up from a chair using your arms (e.g., wheelchair or bedside chair)?: A Little Help needed to walk in hospital room?: A Little Help needed climbing 3-5 steps with a railing? : Total 6 Click Score: 16    End of Session   Activity Tolerance: Patient limited by pain Patient left: in bed;with call bell/phone within reach;with bed alarm set Nurse Communication: Mobility status PT Visit Diagnosis: Pain;Other abnormalities of gait and mobility (R26.89) Pain - Right/Left:  Right Pain - part of body: Hip     Time: 8952-8884 PT Time Calculation (min) (ACUTE ONLY): 28 min  Charges:    $Therapeutic Activity: 23-37 mins PT General Charges $$ ACUTE PT VISIT: 1 Visit                     Randine Essex, PT, MPT    Randine LULLA Essex 12/29/2023, 12:42 PM

## 2023-12-29 NOTE — Progress Notes (Addendum)
 [facility] Progress Note   Subjective:  Seen in room. No acute events. No new complaints. For dialysis today.    Objective Vitals:   12/28/23 1642 12/28/23 2100 12/29/23 0000 12/29/23 0835  BP: (!) 158/91 107/68 112/70 113/72  Pulse: 93 86 100 95  Resp:  14 12   Temp: 98.1 F (36.7 C) 99.3 F (37.4 C) 98.7 F (37.1 C) 98.4 F (36.9 C)  TempSrc: Oral Axillary Axillary   SpO2: 98% 98% 90% 97%  Weight:      Height:       Physical Exam General: Alert, sitting up in bed, nad Heart: RRR  Lungs: nml WOB on RA Abdomen: soft, ND Extremities: b/l BKA, no edema Dialysis Access: [facility]   Filed Weights   12/25/23 1205 12/27/23 0846 12/27/23 1215  Weight: 42 kg (S) 41.6 kg (S) 41 kg    Intake/Output Summary (Last 24 hours) at 12/29/2023 1103 Last data filed at 12/28/2023 1300 Gross per 24 hour  Intake 240 ml  Output --  Net 240 ml    Additional Objective Labs: Basic Metabolic Panel: Recent Labs  Lab 12/24/23 0513 12/25/23 0615 12/27/23 0513 12/28/23 0450 12/29/23 0820  NA 130*   < > 130* 132* 130*  K 3.9   < > 4.7 4.0 4.6  CL 91*   < > 92* 94* 92*  CO2 24   < > 23 23 23   GLUCOSE 176*   < > 144* 160* 168*  BUN 27*   < > 26* 15 25*  CREATININE 6.35*   < > 6.30* 4.13* 5.68*  CALCIUM  9.3   < > 9.4 9.1 9.5  PHOS 4.9*  --   --   --   --    < > = values in this interval not displayed.   Liver Function Tests: No results for input(s): AST, ALT, ALKPHOS, BILITOT, PROT, ALBUMIN  in the last 168 hours.  CBC: Recent Labs  Lab 12/23/23 0702 12/24/23 0513 12/25/23 0615 12/26/23 0445 12/27/23 0513  WBC 6.6 6.5 6.4 7.1 8.3  HGB 9.4* 8.9* 8.8* 9.4* 9.2*  HCT 29.3* 27.6* 27.2* 28.9* 28.7*  MCV 99.0 98.2 99.3 99.3 99.3  PLT 231 226 232 269 329   Blood Culture    Component Value Date/Time   SDES BLOOD RIGHT ARM 12/15/2023 0613   SPECREQUEST  12/15/2023 0613    BOTTLES DRAWN AEROBIC AND ANAEROBIC Blood Culture adequate volume   CULT   12/15/2023 0613    NO GROWTH 5 DAYS Performed at [facility], [address], [place], [place] [address]    REPTSTATUS 12/20/2023 FINAL 12/15/2023 9386    Medications:  ceFEPime  (MAXIPIME ) IV     vancomycin       acetaminophen   1,000 mg Oral TID   bictegravir-emtricitabine -tenofovir  AF  1 tablet Oral Daily   Chlorhexidine  Gluconate Cloth  6 each Topical Q0600   [START ON 01/02/2024] darbepoetin (ARANESP ) injection - DIALYSIS  100 mcg Subcutaneous Q Fri-1800   gabapentin   100 mg Oral TID   heparin   5,000 Units Subcutaneous Q8H   heparin  sodium (porcine)  4,000 Units Intracatheter Once   insulin  aspart  0-5 Units Subcutaneous QHS   insulin  aspart  0-6 Units Subcutaneous TID WC   lidocaine   1 patch Transdermal Q24H   methocarbamol   500 mg Oral TID   midodrine   10 mg Oral TID WC   morphine   15 mg Oral Q12H   oxyCODONE   15 mg Oral Q24H   sevelamer   carbonate  800 mg Oral TID WC   sodium chloride  flush  10-40 mL Intracatheter Q12H    Dialysis Orders: [facility] 3.5h  B400  45.5kg  TDC  Heparin  2000 Mircera 100 mcg - last dose 12/01/23 Calcitriol  1.0 mcg (on hold due to high Ca)   Home bp meds: Midodrine  10mg  tid prn for SBP < 100 Midodrine  10mg  pre HD mwf prn   Assessment/Plan: 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 Not a surgical candidate. Blood cxs ng. Plan for IV Cefepime  2g and IV Vancomycin  500mg  with HD X 8 weeks end date 02/09/24. Pain management per primary team.   ESRD: on HD MWF.  Next HD 12/29/23.  BP: chronic low BP on midodrine  10 tid prn and 10mg  pre HD mwf as needed for SBP < 100. Can give albumin  during HD for low BP.  Volume: Does not appear volume overloaded.   UF as tolerated.  Weight loss.Post weight 41kg. Anemia of esrd: Hgb 9.2.  Ordered Aranesp  200mcg qwk while here.  No IV iron  due to infection.  HIV: controlled on Biktarvy  BMD: Corrected Ca 10.3 and phos 4.9. Continue sevelamer  TID WC. VDRA on hold due to high Ca. Nutrition  - renal diet w/fluid restrictions. Debility: PT has requested that we schedule him for 2nd shift whenever we can.    Alvin Ronnald Acosta PA-C [facility] 12/29/2023,11:04 AM  Pt seen, examined and agree w assess/plan as above with additions as indicated.  Rob [facility] 12/29/2023, 3:23 PM

## 2023-12-29 NOTE — Plan of Care (Signed)

## 2023-12-29 NOTE — TOC Progression Note (Signed)
 Transition of Care [facility]) - Progression Note    Patient Details  Name: Alvin Daniels MRN: [id number] Date of Birth: 12-26-1970  [facility]) CM/SW Contact  Lauraine FORBES Saa, LCSWA Phone Number: 12/29/2023, 3:26 PM  Clinical Narrative:     3:26 PM Per [facility] admissions, patient has yet to be admitted to program due to current participation and rehab potential. Patient is to remain on [facility] list for possible admission into the program in the future when a slot opens up based on qualifications. Medical team made aware. CSW informed medical team and [facility] program admissions that [facility] and charity HH are patient's current options due to lack of insurance. TOC will continue to follow and be available to assist.  Expected Discharge Plan: Home/Self Care Barriers to Discharge: Continued Medical Work up               Expected Discharge Plan and Services       Living arrangements for the past 2 months: Single Family Home                                       Social Drivers of Health (SDOH) Interventions SDOH Screenings   Food Insecurity: No Food Insecurity (12/15/2023)  Housing: Low Risk  (12/15/2023)  Transportation Needs: No Transportation Needs (12/15/2023)  Utilities: Not At Risk (12/15/2023)  Alcohol  Screen: Low Risk  (07/17/2023)  Depression (PHQ2-9): Low Risk  (07/17/2023)  Financial Resource Strain: Low Risk  (01/29/2023)  Physical Activity: Inactive (01/29/2023)  Social Connections: Moderately Isolated (10/08/2023)  Stress: No Stress Concern Present (01/29/2023)  Tobacco Use: Low Risk  (12/14/2023)  Health Literacy: Adequate Health Literacy (01/29/2023)    Readmission Risk Interventions     No data to display

## 2023-12-29 NOTE — Plan of Care (Signed)
  Problem: Education: Goal: Knowledge of General Education information will improve Description: Including pain rating scale, medication(s)/side effects and non-pharmacologic comfort measures Outcome: Progressing   Problem: Health Behavior/Discharge Planning: Goal: Ability to manage health-related needs will improve Outcome: Progressing   Problem: Clinical Measurements: Goal: Ability to maintain clinical measurements within normal limits will improve Outcome: Progressing Goal: Will remain free from infection Outcome: Progressing Goal: Diagnostic test results will improve Outcome: Progressing Goal: Respiratory complications will improve Outcome: Progressing Goal: Cardiovascular complication will be avoided Outcome: Progressing   Problem: Nutrition: Goal: Adequate nutrition will be maintained Outcome: Progressing   Problem: Coping: Goal: Level of anxiety will decrease Outcome: Progressing   Problem: Elimination: Goal: Will not experience complications related to bowel motility Outcome: Progressing Goal: Will not experience complications related to urinary retention Outcome: Progressing   Problem: Safety: Goal: Ability to remain free from injury will improve Outcome: Progressing   Problem: Skin Integrity: Goal: Risk for impaired skin integrity will decrease Outcome: Progressing   Problem: Education: Goal: Ability to describe self-care measures that may prevent or decrease complications (Diabetes Survival Skills Education) will improve Outcome: Progressing Goal: Individualized Educational Video(s) Outcome: Progressing   Problem: Coping: Goal: Ability to adjust to condition or change in health will improve Outcome: Progressing   Problem: Fluid Volume: Goal: Ability to maintain a balanced intake and output will improve Outcome: Progressing   Problem: Health Behavior/Discharge Planning: Goal: Ability to identify and utilize available resources and services will  improve Outcome: Progressing Goal: Ability to manage health-related needs will improve Outcome: Progressing   Problem: Metabolic: Goal: Ability to maintain appropriate glucose levels will improve Outcome: Progressing   Problem: Nutritional: Goal: Maintenance of adequate nutrition will improve Outcome: Progressing Goal: Progress toward achieving an optimal weight will improve Outcome: Progressing   Problem: Skin Integrity: Goal: Risk for impaired skin integrity will decrease Outcome: Progressing   Problem: Tissue Perfusion: Goal: Adequacy of tissue perfusion will improve Outcome: Progressing   

## 2023-12-30 LAB — CBC
HCT: 27.8 % — ABNORMAL LOW (ref 39.0–52.0)
Hemoglobin: 8.7 g/dL — ABNORMAL LOW (ref 13.0–17.0)
MCH: 31.2 pg (ref 26.0–34.0)
MCHC: 31.3 g/dL (ref 30.0–36.0)
MCV: 99.6 fL (ref 80.0–100.0)
Platelets: 283 K/uL (ref 150–400)
RBC: 2.79 MIL/uL — ABNORMAL LOW (ref 4.22–5.81)
RDW: 15.6 % — ABNORMAL HIGH (ref 11.5–15.5)
WBC: 8.5 K/uL (ref 4.0–10.5)
nRBC: 0 % (ref 0.0–0.2)

## 2023-12-30 LAB — BASIC METABOLIC PANEL WITH GFR
Anion gap: 16 — ABNORMAL HIGH (ref 5–15)
BUN: 35 mg/dL — ABNORMAL HIGH (ref 6–20)
CO2: 22 mmol/L (ref 22–32)
Calcium: 9.4 mg/dL (ref 8.9–10.3)
Chloride: 90 mmol/L — ABNORMAL LOW (ref 98–111)
Creatinine, Ser: 7.05 mg/dL — ABNORMAL HIGH (ref 0.61–1.24)
GFR, Estimated: 9 mL/min — ABNORMAL LOW (ref >60)
Glucose, Bld: 157 mg/dL — ABNORMAL HIGH (ref 70–99)
Potassium: 4.6 mmol/L (ref 3.5–5.1)
Sodium: 128 mmol/L — ABNORMAL LOW (ref 135–145)

## 2023-12-30 LAB — MAGNESIUM: Magnesium: 2.4 mg/dL (ref 1.7–2.4)

## 2023-12-30 LAB — GLUCOSE, CAPILLARY
Glucose-Capillary: 131 mg/dL — ABNORMAL HIGH (ref 70–99)
Glucose-Capillary: 127 mg/dL — ABNORMAL HIGH (ref 70–99)
Glucose-Capillary: 131 mg/dL — ABNORMAL HIGH (ref 70–99)
Glucose-Capillary: 192 mg/dL — ABNORMAL HIGH (ref 70–99)

## 2023-12-30 MED ORDER — ALBUMIN HUMAN 25 % IV SOLN
25.0000 g | Freq: Once | INTRAVENOUS | Status: AC
  Administered 2023-12-30: 25 g via INTRAVENOUS

## 2023-12-30 MED ORDER — ALBUMIN HUMAN 25 % IV SOLN
INTRAVENOUS | Status: AC
  Filled 2023-12-30: qty 100

## 2023-12-30 MED ORDER — SODIUM CHLORIDE 0.9 % IV SOLN
1.0000 g | Freq: Once | INTRAVENOUS | Status: AC
  Administered 2023-12-30: 1 g via INTRAVENOUS
  Filled 2023-12-30: qty 10

## 2023-12-30 MED ORDER — MIDODRINE HCL 5 MG PO TABS
ORAL_TABLET | ORAL | Status: AC
  Filled 2023-12-30: qty 2

## 2023-12-30 MED ORDER — VANCOMYCIN HCL 500 MG/100ML IV SOLN
500.0000 mg | Freq: Once | INTRAVENOUS | Status: AC
  Administered 2023-12-30: 500 mg via INTRAVENOUS
  Filled 2023-12-30: qty 100

## 2023-12-30 NOTE — Progress Notes (Signed)
 Pharmacy Antibiotic Note  Alvin Daniels is a 53 y.o. male admitted on 12/14/2023 with osteomyelitis.  Pharmacy has been consulted for vancomycin  and cefepime  dosing. He is ESRD on MWF HD.  8/18 HD delayed until 8/19.  Received 3 hrs. Received cefepime  1gm IV last night 8/18  Plan: Vanc 500mg  IV x 1 now, then continue 500mg  IV qHD MWF Cefepime  1gm IV x 1 now, then resume 2gm IV qHD MWF Monitor HD schedule/tolerance, clinical progress, vanc levels as indicated Abx end date: 9/29   Height: 4' 11 (149.9 cm) Weight: 42.1 kg (92 lb 13 oz) IBW/kg (Calculated) : 47.7  Temp (24hrs), Avg:98.4 F (36.9 C), Min:98 F (36.7 C), Max:98.7 F (37.1 C)  Recent Labs  Lab 12/24/23 0513 12/25/23 0615 12/26/23 0445 12/27/23 0513 12/28/23 0450 12/29/23 0820 12/30/23 0000 12/30/23 0821  WBC 6.5 6.4 7.1 8.3  --   --  8.5  --   CREATININE 6.35* 7.56* 4.64* 6.30* 4.13* 5.68*  --  7.05*  VANCORANDOM  --   --   --   --   --  16  --   --      Vanc 8/5 >> (9/29) Cefepime  8/5 >> (9/29) Cipro  8/14 >> 8/15 Biktarvy  (continued from PTA)    8/11 VT = 21  8/18 VR = 16, HD delayed until 8/19, BFR 350 x3 hrs  8/4 BCx - negative  [name] Liaw D. Lendell, PharmD, BCPS, BCCCP 12/30/2023, 1:12 PM

## 2023-12-30 NOTE — Progress Notes (Signed)
 Physical Therapy Treatment Patient Details Name: Alvin Daniels MRN: [id number] DOB: 11/23/1970 Today's Date: 12/30/2023   History of Present Illness Patient is a 53 y/o male admitted 12/14/23 with hip pain MRI showing R psoas abscess and L4-L5 discitis and osteomyelitis. 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  PMH includes ESRD on HD MWF, hepC, MRSA, HIV, bilat BKA, DMII.    PT Comments  Continuing work on functional mobility and activity tolerance;  Goal of session was to discuss considerations for DC home, and demonstrate use of wheelchair and drop-arm BSC - so [name] can decide if they would be useful at home;   [name] was engaged in conversation (with Raquel, Medical Spanish Interpreter present to facilitate clear communication); He confirmed that he has a wheelchair and uses it without difficulty when using his prostheses is too painful; Tells us  he would rather be able to walk more, and this PT is in agreement; He also tells us  that using his wheelchair is a workable option for him at home; This PT demonstrated wheelchair to drop-arm BSC lateral scooting transfer, and pt watched and reflected that the technique could be useful because he often hikes his L hip as a position of decreased pain -- this demonstrates that he is making the connection between what we are doing here as a way to do things at home;   By the end of session, [name] was telling us  about exercises he likes to do at home, and he seemed encouraged by the though of getting home;   I plan to reach out to his Prosthetist to possibly get a fit check for his R prosthesis here, or very shortly after DC;   Noteworthy that pt offered to take one of his own BP medications during HD (he had some of his own meds in a pocket of his pullover); Educated on safety with meds -- to NOT take his own meds in the hospital; Notified his HD RN   If plan is  discharge home, recommend the following: A little help with walking and/or transfers;Help with stairs or ramp for entrance;Assist for transportation   Can travel by private vehicle        Equipment Recommendations  BSC/3in1 (drop-arm BSC)    Recommendations for Other Services Other (comment) (TOC -- Will need to know what other supports are possible for him at home)     Precautions / Restrictions Precautions Precautions: Fall Recall of Precautions/Restrictions: Impaired Precaution/Restrictions Comments: B BKA; prosthetics in the room Restrictions Weight Bearing Restrictions Per Provider Order: No     Mobility  Bed Mobility               General bed mobility comments: Pt pulling up to sit occasionally as we spoke; BPs quite soft (see HD procedure note)                                                    Communication Communication Communication: No apparent difficulties;Other (comment) Factors Affecting Communication: Non - English speaking, interpreter not available (Raquel, Medical Spanish Interpreter present and facilitated clear communication)  Cognition Arousal: Alert Behavior During Therapy: WFL for tasks assessed/performed   PT - Cognitive impairments: Safety/Judgement  PT - Cognition Comments: Took time to emphasize safety with medications Following commands: Intact      Cueing Cueing Techniques: Verbal cues (and demonstration)  Exercises      General Comments General comments (skin integrity, edema, etc.): Pt offered to take one of his own BP medications during HD (he had some of his own meds in a pocket of his pullover); Educated on safety with meds -- to NOT take his own meds in the hospital; Notified his HD RN      Pertinent Vitals/Pain Pain Assessment Pain Assessment: Faces Pain Location: lower back and left hip Pain Descriptors / Indicators: Discomfort, Sore Pain Intervention(s): Monitored  during session    Home Living                          Prior Function            PT Goals (current goals can now be found in the care plan section) Acute Rehab PT Goals Patient Stated Goal: to improve pain to walk PT Goal Formulation: With patient Time For Goal Achievement: 01/03/24 Potential to Achieve Goals: Good Progress towards PT goals: Progressing toward goals    Frequency    Min 2X/week      PT Plan      Co-evaluation              AM-PAC PT 6 Clicks Mobility   Outcome Measure  Help needed turning from your back to your side while in a flat bed without using bedrails?: None Help needed moving from lying on your back to sitting on the side of a flat bed without using bedrails?: A Lot Help needed moving to and from a bed to a chair (including a wheelchair)?: A Little Help needed standing up from a chair using your arms (e.g., wheelchair or bedside chair)?: A Little Help needed to walk in hospital room?: A Little Help needed climbing 3-5 steps with a railing? : Total 6 Click Score: 16    End of Session   Activity Tolerance: Patient tolerated treatment well (good participation in conversation) Patient left: in bed;with call bell/phone within reach;with bed alarm set (continuing HD) Nurse Communication: Mobility status PT Visit Diagnosis: Pain;Other abnormalities of gait and mobility (R26.89) Pain - Right/Left: Right Pain - part of body: Hip;Leg     Time: 9094-9049 PT Time Calculation (min) (ACUTE ONLY): 45 min  Charges:     12/30/23 1000  PT General Charges  $$ ACUTE PT VISIT 1 Visit  PT Treatments  $Therapeutic Activity 38-52 mins                            Silvano Currier, PT  Acute Rehabilitation Services Office [phone] Secure Chat welcomed    Silvano VEAR Currier 12/30/2023, 11:12 AM

## 2023-12-30 NOTE — Plan of Care (Signed)
   Problem: Education: Goal: Knowledge of General Education information will improve Description: Including pain rating scale, medication(s)/side effects and non-pharmacologic comfort measures Outcome: Progressing   Problem: Health Behavior/Discharge Planning: Goal: Ability to manage health-related needs will improve Outcome: Progressing   Problem: Clinical Measurements: Goal: Ability to maintain clinical measurements within normal limits will improve Outcome: Progressing Goal: Will remain free from infection Outcome: Progressing Goal: Diagnostic test results will improve Outcome: Progressing Goal: Respiratory complications will improve Outcome: Progressing Goal: Cardiovascular complication will be avoided Outcome: Progressing   Problem: Activity: Goal: Risk for activity intolerance will decrease Outcome: Progressing   Problem: Nutrition: Goal: Adequate nutrition will be maintained Outcome: Progressing   Problem: Coping: Goal: Level of anxiety will decrease Outcome: Progressing   Problem: Elimination: Goal: Will not experience complications related to bowel motility Outcome: Progressing Goal: Will not experience complications related to urinary retention Outcome: Progressing   Problem: Safety: Goal: Ability to remain free from injury will improve Outcome: Progressing   Problem: Skin Integrity: Goal: Risk for impaired skin integrity will decrease Outcome: Progressing   Problem: Education: Goal: Ability to describe self-care measures that may prevent or decrease complications (Diabetes Survival Skills Education) will improve Outcome: Progressing Goal: Individualized Educational Video(s) Outcome: Progressing   Problem: Coping: Goal: Ability to adjust to condition or change in health will improve Outcome: Progressing   Problem: Fluid Volume: Goal: Ability to maintain a balanced intake and output will improve Outcome: Progressing   Problem: Health  Behavior/Discharge Planning: Goal: Ability to identify and utilize available resources and services will improve Outcome: Progressing Goal: Ability to manage health-related needs will improve Outcome: Progressing   Problem: Metabolic: Goal: Ability to maintain appropriate glucose levels will improve Outcome: Progressing   Problem: Nutritional: Goal: Maintenance of adequate nutrition will improve Outcome: Progressing Goal: Progress toward achieving an optimal weight will improve Outcome: Progressing   Problem: Skin Integrity: Goal: Risk for impaired skin integrity will decrease Outcome: Progressing   Problem: Tissue Perfusion: Goal: Adequacy of tissue perfusion will improve Outcome: Progressing

## 2023-12-30 NOTE — Progress Notes (Signed)
   12/30/23 1133  Vitals  Temp 98.1 F (36.7 C)  Pulse Rate 94  Resp 11  BP 113/77  SpO2 94 %  O2 Device Room Air  Post Treatment  Dialyzer Clearance Lightly streaked  Hemodialysis Intake (mL) 100 mL  Liters Processed 63  Fluid Removed (mL) 1000 mL  Tolerated HD Treatment Yes   Received patient in bed to unit.  Alert and oriented.  Informed consent signed and in chart.   TX duration:3HRS  Patient tolerated well.  Transported back to the room  Alert, without acute distress.  Hand-off given to patient's nurse.   Access used: [facility] Access issues: NONE  Total UF removed: 1L Medication(s) given: MIDODRINE , ALBUMIN     [facility]

## 2023-12-30 NOTE — Plan of Care (Signed)

## 2023-12-30 NOTE — Progress Notes (Signed)
 PROGRESS NOTE    Alvin Daniels  FMW:[id number] DOB: 03/01/1971 DOA: 12/14/2023 PCP: Pcp, No    Brief Narrative:   53 y.o. 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 coli and Streptococcus anginosus who presents with hip pain MRI was done that showed a right psoas abscess and L4-L5 discitis and osteomyelitis.  Evaluated by IR but no safe access and not a surgical candidate.  ID is recommending total 8 weeks of vancomycin  and cefepime  with HD, EOT 02/09/2024 Working on pain control and safe disposition.  Assessment & Plan:  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)   Severe protein-calorie malnutrition ([name])   Normocytic anemia   ESRD on hemodialysis ([name])   S/P bilateral BKA (below knee amputation) ([name])   Chronic hepatitis C without hepatic coma ([name])   HIV (human immunodeficiency virus infection) ([name])   Hx MRSA infection   Osteomyelitis of lumbar spine ([name])   Epidural abscess   Paraspinal abscess ([name])   Pyogenic inflammation of bone ([name])     L4-L5 osteomyelitis/discitis with right psoas abscess -MRI sacrum noting a 2.2 x 1 x 4.1 cm right psoas abscess.  Repeat MRI this morning noting discitis/osteomyelitis at L4-L5, right psoas abscess, and epidural phlegmon.  Also noting small paraspinal abscess anterior to L4-L5 as well as possible small left psoas abscess.  IR consulted for possible drainage however no safe access appreciated.  Not a surgical candidate.  -ID recommendations for patient to receive cefepime  and vancomycin  with HD for total of 2 weeks, EOT 02/09/2024   ESRD on HD MWF - Nephrology following  closely. Afternoon HD has been requested.    Chronic hypotension -Renal following. Increased Midodrine .    Uncontrolled diabetes mellitus - Previous A1c 10 suggesting very poor control.  Currently on insulin  sliding scale.   HIV/chronic hep C - ID following.  Continue Biktarvy .   Severe protein calorie malnutrition - Protein supplementation encouraged.   Bilateral BKA with increased hip/leg pain - Contributing to physical debilitation and muscle weakness.  BL prosthetics in room.  Patient complaining of worsening pain with ambulation.  He has been advised pain will remain an issue and he will need to participate with therapy as best as he can. Increase in pain meds is resulting in low BP and affecting his HD sessions. Midodrine  does seem to help his BP therefore its scheduled for now.  -Ongoing pain management.    MS Contin  15 mg every 12 hours  oxycodone  15 mg IR every morning before working with therapy  oxycodone  IR 15 mg every 6 as needed. Robaxin  500 mg 3 times daily Gabapentin  100 mg 3 times daily     Goals of care - Disposition planning with patient, TOC, infectious disease, nephrology.  Infectious disease consulted recommending conservative management with IV cefepime  plus vancomycin .  Patient received cefepime  plus vancomycin  with HD for total of 8 weeks, end date 02/09/2024.  Repeat MRI recommended by infectious disease as well, to be scheduled in the outpatient setting.  Working closely with TOC to ensure this is all set up prior to discharge home.  SNF not an option given patient is not a [place]  citizen.  DVT prophylaxis: heparin  injection 5,000 Units Start: 12/15/23 1400  Code Status: Full Code Family Communication:   Status is: Inpatient Remains inpatient appropriate because: Working on safe disposition   Subjective: Comfortable in HD but continues to report of his lower extremity pain.  I have explained to him again that this pain is chronic and we will not be able to  eliminate or make him pain-free.  He has to develop some tolerance to pain.  Increasing pain medicine is at times limited by his blood pressure and hemodialysis.  Examination:  General exam: Appears calm and comfortable  Respiratory system: Clear to auscultation. Respiratory effort normal. Cardiovascular system: S1 & S2 heard, RRR. No JVD, murmurs, rubs, gallops or clicks. No pedal edema. Gastrointestinal system: Abdomen is nondistended, soft and nontender. No organomegaly or masses felt. Normal bowel sounds heard. Central nervous system: Alert and oriented. No focal neurological deficits. Extremities: b/l bka. Digit amputations noted.  Skin: No rashes, lesions or ulcers Psychiatry: Judgement and insight appear normal. Mood & affect appropriate.                Diet Orders (From admission, onward)     Start     Ordered   12/16/23 1646  Diet renal/carb modified with fluid restriction Diet-HS Snack? Nothing; Fluid restriction: 1200 mL Fluid; Room service appropriate? Yes; Fluid consistency: Thin  Diet effective now       Question Answer Comment  Diet-HS Snack? Nothing   Fluid restriction: 1200 mL Fluid   Room service appropriate? Yes   Fluid consistency: Thin      12/16/23 1646            Objective: Vitals:   12/30/23 1045 12/30/23 1100 12/30/23 1129 12/30/23 1133  BP: (!) 106/24 115/71 (!) 159/101 113/77  Pulse: 84 90 (!) 117 94  Resp: (!) 26 12 (!) 29 11  Temp:    98.1 F (36.7 C)  TempSrc:      SpO2: 94% 98% 90% 94%  Weight:      Height:        Intake/Output Summary (Last 24 hours) at 12/30/2023 1220 Last data filed at 12/30/2023 1133 Gross per 24 hour  Intake 100 ml  Output 1000 ml  Net -900 ml   Filed Weights   12/27/23 0846 12/27/23 1215 12/30/23 0803  Weight: (S) 41.6 kg (S) 41 kg 42.1 kg    Scheduled Meds:  acetaminophen   1,000 mg Oral TID   bictegravir-emtricitabine -tenofovir  AF  1 tablet Oral Daily   Chlorhexidine  Gluconate Cloth  6 each  Topical Q0600   [START ON 01/02/2024] darbepoetin (ARANESP ) injection - DIALYSIS  100 mcg Subcutaneous Q Fri-1800   gabapentin   100 mg Oral TID   heparin   5,000 Units Subcutaneous Q8H   heparin  sodium (porcine)  4,000 Units Intracatheter Once   insulin  aspart  0-5 Units Subcutaneous QHS   insulin  aspart  0-6 Units Subcutaneous TID WC   lidocaine   1 patch Transdermal Q24H   methocarbamol   500 mg Oral TID   midodrine   10 mg Oral TID WC   morphine   15 mg Oral Q12H   oxyCODONE   15 mg Oral Q24H   sevelamer  carbonate  800 mg Oral TID WC   sodium chloride  flush  10-40 mL Intracatheter Q12H   Continuous Infusions:  ceFEPime  (MAXIPIME ) IV     vancomycin       Nutritional status     Body mass index is 18.75 kg/m.  Data Reviewed:   CBC: Recent Labs  Lab 12/24/23 0513 12/25/23 0615 12/26/23 0445 12/27/23  9486 12/30/23 0000  WBC 6.5 6.4 7.1 8.3 8.5  HGB 8.9* 8.8* 9.4* 9.2* 8.7*  HCT 27.6* 27.2* 28.9* 28.7* 27.8*  MCV 98.2 99.3 99.3 99.3 99.6  PLT 226 232 269 329 283   Basic Metabolic Panel: Recent Labs  Lab 12/24/23 0513 12/25/23 0615 12/26/23 0445 12/27/23 0513 12/28/23 0450 12/29/23 0820 12/30/23 0821  NA 130* 126* 131* 130* 132* 130* 128*  K 3.9 4.1 4.2 4.7 4.0 4.6 4.6  CL 91* 91* 94* 92* 94* 92* 90*  CO2 24 21* 24 23 23 23 22   GLUCOSE 176* 170* 121* 144* 160* 168* 157*  BUN 27* 37* 17 26* 15 25* 35*  CREATININE 6.35* 7.56* 4.64* 6.30* 4.13* 5.68* 7.05*  CALCIUM  9.3 9.2 8.9 9.4 9.1 9.5 9.4  MG 2.1 2.3 2.0 2.2  --   --  2.4  PHOS 4.9*  --   --   --   --   --   --    GFR: Estimated Creatinine Clearance: 7.2 mL/min (A) (by C-G formula based on SCr of 7.05 mg/dL (H)). Liver Function Tests: No results for input(s): AST, ALT, ALKPHOS, BILITOT, PROT, ALBUMIN  in the last 168 hours. No results for input(s): LIPASE, AMYLASE in the last 168 hours. No results for input(s): AMMONIA in the last 168 hours. Coagulation Profile: No results for input(s):  INR, PROTIME in the last 168 hours. Cardiac Enzymes: No results for input(s): CKTOTAL, CKMB, CKMBINDEX, TROPONINI in the last 168 hours. BNP (last 3 results) No results for input(s): PROBNP in the last 8760 hours. HbA1C: No results for input(s): HGBA1C in the last 72 hours. CBG: Recent Labs  Lab 12/29/23 0842 12/29/23 1227 12/29/23 1758 12/29/23 2200 12/30/23 0725  GLUCAP 174* 137* 202* 117* 131*   Lipid Profile: No results for input(s): CHOL, HDL, LDLCALC, TRIG, CHOLHDL, LDLDIRECT in the last 72 hours. Thyroid Function Tests: No results for input(s): TSH, T4TOTAL, FREET4, T3FREE, THYROIDAB in the last 72 hours. Anemia Panel: No results for input(s): VITAMINB12, FOLATE, FERRITIN, TIBC, IRON , RETICCTPCT in the last 72 hours. Sepsis Labs: No results for input(s): PROCALCITON, LATICACIDVEN in the last 168 hours.  No results found for this or any previous visit (from the past 240 hours).       Radiology Studies: No results found.         LOS: 15 days   Time spent= 35 mins    Burgess JAYSON Dare, MD [facility]  If 7PM-7AM, please contact night-coverage  12/30/2023, 12:20 PM

## 2023-12-30 NOTE — Progress Notes (Signed)
 [facility] Progress Note   Subjective:  Seen in [facility]. Just finished dialysis - net UF 1L No new complaints. Talking with family members.   Objective Vitals:   12/30/23 1045 12/30/23 1100 12/30/23 1129 12/30/23 1133  BP: (!) 106/24 115/71 (!) 159/101 113/77  Pulse: 84 90 (!) 117 94  Resp: (!) 26 12 (!) 29 11  Temp:    98.1 F (36.7 C)  TempSrc:      SpO2: 94% 98% 90% 94%  Weight:      Height:       Physical Exam General: Alert, sitting up in bed, nad Heart: RRR  Lungs: nml WOB on RA Abdomen: soft, ND Extremities: b/l BKA, no edema Dialysis Access: [facility]   Filed Weights   12/27/23 0846 12/27/23 1215 12/30/23 0803  Weight: (S) 41.6 kg (S) 41 kg 42.1 kg    Intake/Output Summary (Last 24 hours) at 12/30/2023 1144 Last data filed at 12/30/2023 1133 Gross per 24 hour  Intake 100 ml  Output 1000 ml  Net -900 ml    Additional Objective Labs: Basic Metabolic Panel: Recent Labs  Lab 12/24/23 0513 12/25/23 0615 12/28/23 0450 12/29/23 0820 12/30/23 0821  NA 130*   < > 132* 130* 128*  K 3.9   < > 4.0 4.6 4.6  CL 91*   < > 94* 92* 90*  CO2 24   < > 23 23 22   GLUCOSE 176*   < > 160* 168* 157*  BUN 27*   < > 15 25* 35*  CREATININE 6.35*   < > 4.13* 5.68* 7.05*  CALCIUM  9.3   < > 9.1 9.5 9.4  PHOS 4.9*  --   --   --   --    < > = values in this interval not displayed.   Liver Function Tests: No results for input(s): AST, ALT, ALKPHOS, BILITOT, PROT, ALBUMIN  in the last 168 hours.  CBC: Recent Labs  Lab 12/24/23 0513 12/25/23 0615 12/26/23 0445 12/27/23 0513 12/30/23 0000  WBC 6.5 6.4 7.1 8.3 8.5  HGB 8.9* 8.8* 9.4* 9.2* 8.7*  HCT 27.6* 27.2* 28.9* 28.7* 27.8*  MCV 98.2 99.3 99.3 99.3 99.6  PLT 226 232 269 329 283   Blood Culture    Component Value Date/Time   SDES BLOOD RIGHT ARM 12/15/2023 0613   SPECREQUEST  12/15/2023 0613    BOTTLES DRAWN AEROBIC AND ANAEROBIC Blood Culture adequate volume   CULT  12/15/2023 0613    NO  GROWTH 5 DAYS Performed at [facility], [address], [place], [place] [address]    REPTSTATUS 12/20/2023 FINAL 12/15/2023 9386    Medications:  ceFEPime  (MAXIPIME ) IV     vancomycin       acetaminophen   1,000 mg Oral TID   bictegravir-emtricitabine -tenofovir  AF  1 tablet Oral Daily   Chlorhexidine  Gluconate Cloth  6 each Topical Q0600   [START ON 01/02/2024] darbepoetin (ARANESP ) injection - DIALYSIS  100 mcg Subcutaneous Q Fri-1800   gabapentin   100 mg Oral TID   heparin   5,000 Units Subcutaneous Q8H   heparin  sodium (porcine)  4,000 Units Intracatheter Once   insulin  aspart  0-5 Units Subcutaneous QHS   insulin  aspart  0-6 Units Subcutaneous TID WC   lidocaine   1 patch Transdermal Q24H   methocarbamol   500 mg Oral TID   midodrine   10 mg Oral TID WC   morphine   15 mg Oral Q12H   oxyCODONE   15 mg Oral Q24H   sevelamer   carbonate  800 mg Oral TID WC   sodium chloride  flush  10-40 mL Intracatheter Q12H    Dialysis Orders: [facility] 3.5h  B400  45.5kg  TDC  Heparin  2000 Mircera 100 mcg - last dose 12/01/23 Calcitriol  1.0 mcg (on hold due to high Ca)   Home bp meds: Midodrine  10mg  tid prn for SBP < 100 Midodrine  10mg  pre HD mwf prn   Assessment/Plan: 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 Not a surgical candidate. Blood cxs ng. Plan for IV Cefepime  2g and IV Vancomycin  500mg  with HD X 8 weeks end date 02/09/24. Pain management per primary team.   ESRD: on HD MWF. HD off schedule today d/t staffing/inpatient census. Plan for next HD Thurs. Schedule for 2nd shift as able.  BP: chronic low BP on midodrine  10 tid prn and 10mg  pre HD mwf as needed for SBP < 100. Can give albumin  during HD for low BP.  Volume:  UF as tolerated. Has lost weight. Now under EDW.  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  No IV iron  due to infection.  HIV: controlled on Biktarvy  BMD: Corrected Ca 10.3 and phos 4.9. Continue sevelamer  TID WC. VDRA on hold due to high  Ca. Nutrition - renal diet w/fluid restrictions. Debility: PT has requested that we schedule him for 2nd shift whenever we can.    Maisie Ronnald Acosta PA-C  [facility] 12/30/2023,11:44 AM

## 2023-12-31 ENCOUNTER — Inpatient Hospital Stay (HOSPITAL_COMMUNITY): Payer: MEDICAID

## 2023-12-31 LAB — GLUCOSE, CAPILLARY
Glucose-Capillary: 165 mg/dL — ABNORMAL HIGH (ref 70–99)
Glucose-Capillary: 185 mg/dL — ABNORMAL HIGH (ref 70–99)
Glucose-Capillary: 201 mg/dL — ABNORMAL HIGH (ref 70–99)
Glucose-Capillary: 169 mg/dL — ABNORMAL HIGH (ref 70–99)

## 2023-12-31 MED ORDER — SODIUM CHLORIDE 0.9 % IV SOLN
2.0000 g | Freq: Once | INTRAVENOUS | Status: AC
  Administered 2024-01-01: 2 g via INTRAVENOUS
  Filled 2023-12-31: qty 12.5

## 2023-12-31 MED ORDER — CHLORHEXIDINE GLUCONATE CLOTH 2 % EX PADS
6.0000 | MEDICATED_PAD | Freq: Every day | CUTANEOUS | Status: DC
  Administered 2024-01-01 – 2024-01-16 (×16): 6 via TOPICAL

## 2023-12-31 MED ORDER — VANCOMYCIN HCL 500 MG/100ML IV SOLN
500.0000 mg | INTRAVENOUS | Status: AC
  Administered 2024-01-01: 500 mg via INTRAVENOUS
  Filled 2023-12-31: qty 100

## 2023-12-31 MED ORDER — SODIUM CHLORIDE 0.9 % IV SOLN
2.0000 g | INTRAVENOUS | Status: DC
  Administered 2024-01-03 (×2): 2 g via INTRAVENOUS

## 2023-12-31 MED ORDER — VANCOMYCIN HCL 500 MG/100ML IV SOLN
500.0000 mg | INTRAVENOUS | Status: DC
  Administered 2024-01-03: 500 mg via INTRAVENOUS
  Filled 2023-12-31 (×2): qty 100

## 2023-12-31 MED ORDER — GATIFLOXACIN 0.5 % OP SOLN
1.0000 [drp] | Freq: Two times a day (BID) | OPHTHALMIC | Status: DC
  Administered 2023-12-31 – 2024-03-02 (×114): 1 [drp] via OPHTHALMIC
  Filled 2023-12-31 (×5): qty 2.5

## 2023-12-31 MED ORDER — ARTIFICIAL TEARS OPHTHALMIC OINT
TOPICAL_OINTMENT | Freq: Four times a day (QID) | OPHTHALMIC | Status: DC
  Administered 2024-01-04 – 2024-02-19 (×7): 1 via OPHTHALMIC
  Filled 2023-12-31 (×11): qty 3.5

## 2023-12-31 NOTE — Progress Notes (Addendum)
 [facility] Progress Note   Subjective:  Seen and examined in his room this morning. He reports more pain today in his hips and thighs. His hospitalist added MS Contin  24 hr to help with his pain. Patient had possible vasovagal episode early this morning while on BSC. Plan for HD 01/01/24  Objective Vitals:   12/30/23 2100 12/31/23 0308 12/31/23 0452 12/31/23 0812  BP: 112/82 112/65 (!) 128/57 (!) 125/59  Pulse: 95 89 89 88  Resp:  16 20   Temp: 98.5 F (36.9 C) 98.4 F (36.9 C) 98.5 F (36.9 C) 98.6 F (37 C)  TempSrc: Oral Oral Oral Oral  SpO2: 95% 100% 100% 100%  Weight:      Height:       Physical Exam General: Alert, sitting up in bed, nad Heart: RRR  Lungs: nml WOB on RA Abdomen: soft, ND Extremities: b/l BKA, no edema Dialysis Access: [facility]   Filed Weights   12/27/23 0846 12/27/23 1215 12/30/23 0803  Weight: (S) 41.6 kg (S) 41 kg 42.1 kg    Intake/Output Summary (Last 24 hours) at 12/31/2023 1202 Last data filed at 12/30/2023 1437 Gross per 24 hour  Intake 0 ml  Output --  Net 0 ml    Additional Objective Labs: Basic Metabolic Panel: Recent Labs  Lab 12/28/23 0450 12/29/23 0820 12/30/23 0821  NA 132* 130* 128*  K 4.0 4.6 4.6  CL 94* 92* 90*  CO2 23 23 22   GLUCOSE 160* 168* 157*  BUN 15 25* 35*  CREATININE 4.13* 5.68* 7.05*  CALCIUM  9.1 9.5 9.4    CBC: Recent Labs  Lab 12/25/23 0615 12/26/23 0445 12/27/23 0513 12/30/23 0000  WBC 6.4 7.1 8.3 8.5  HGB 8.8* 9.4* 9.2* 8.7*  HCT 27.2* 28.9* 28.7* 27.8*  MCV 99.3 99.3 99.3 99.6  PLT 232 269 329 283    Medications:  ceFEPime  (MAXIPIME ) IV     vancomycin       acetaminophen   1,000 mg Oral TID   bictegravir-emtricitabine -tenofovir  AF  1 tablet Oral Daily   Chlorhexidine  Gluconate Cloth  6 each Topical Q0600   [START ON 01/02/2024] darbepoetin (ARANESP ) injection - DIALYSIS  100 mcg Subcutaneous Q Fri-1800   gabapentin   100 mg Oral TID   heparin   5,000 Units Subcutaneous Q8H    heparin  sodium (porcine)  4,000 Units Intracatheter Once   insulin  aspart  0-5 Units Subcutaneous QHS   insulin  aspart  0-6 Units Subcutaneous TID WC   lidocaine   1 patch Transdermal Q24H   methocarbamol   500 mg Oral TID   midodrine   10 mg Oral TID WC   morphine   15 mg Oral Q12H   oxyCODONE   15 mg Oral Q24H   sevelamer  carbonate  800 mg Oral TID WC   sodium chloride  flush  10-40 mL Intracatheter Q12H    Dialysis Orders: [facility] 3.5h  B400  45.5kg  TDC  Heparin  2000 Mircera 100 mcg - last dose 12/01/23 Calcitriol  1.0 mcg (on hold due to high Ca)   Home bp meds: Midodrine  10mg  tid prn for SBP < 100 Midodrine  10mg  pre HD mwf prn   Assessment/Plan: 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 Not a surgical candidate. Blood cxs ng. Plan for IV Cefepime  2g and IV Vancomycin  500mg  with HD X 8 weeks end date 02/09/24. Pain management per primary team.   ESRD: on HD MWF. Next HD 01/01/24.  BP: chronic low BP on midodrine  10 tid prn and 10mg  pre HD  mwf as needed for SBP < 100. Can give albumin  during HD for low BP.  Volume:  UF as tolerated. Has lost weight. Now under EDW.  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  No IV iron  due to infection.  HIV: controlled on Biktarvy  BMD: Corrected Ca 10.3 and phos 4.9. Continue sevelamer  TID WC. VDRA on hold due to high Ca. Nutrition - renal diet w/fluid restrictions. Debility: PT has requested that we schedule him for 2nd shift whenever we can.    Belvie Och, NP [facility] 12/31/2023,12:02 PM

## 2023-12-31 NOTE — Plan of Care (Signed)

## 2023-12-31 NOTE — Progress Notes (Addendum)
 Pt was assisted to [facility] by this nurse, primary RN. Pt was sitting on BSC. Primary RN turned to clean the bed pad that patient had incontinence on when patient was bearing down to have BM he had syncopal episode/ vagal episode at 0440AM on 12/31/23. Pt landed forward on his arms and belly did not hit his head as primary RN was right there to witness. Pt did continue to report mucho pain in same L side/ leg that has been ongoing throughout the night. On call DR- Segars,MD notified and will come assess the patient at the bedside. Heat applied and Ice packs offered. Patient is getting high dose pain medications scheduled and PRN as needed.  Vitals obtained and within normal limits. No obvious signs of injury noted different from previous assessment.

## 2023-12-31 NOTE — Progress Notes (Signed)
 Physical Therapy Treatment Patient Details Name: Alvin Daniels MRN: [id number] DOB: 1970-08-15 Today's Date: 12/31/2023   History of Present Illness Patient is a 53 y/o male admitted 12/14/23 with hip pain MRI showing R psoas abscess and L4-L5 discitis and osteomyelitis. 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  PMH includes ESRD on HD MWF, hepC, MRSA, HIV, bilat BKA, DMII.    PT Comments  Continuing work on functional mobility and activity tolerance; I contacted Child psychotherapist at [facility], and she ordered a few sizes of socks for his prosthesis to try and get a better fit. The specialized socks will come in to the [facility] on Friday, and if the patient is still here, his prosthetist can try to come in and see him on this coming Monday, 8/25, to check his prostheses' fit; the hope would be that was a better prosthesis fit right and left, it would help with decreasing pain and increasing activity tolerance;    Met up with Raquel, Medical Spanish Interpreter, and [name] to discuss prothesis fit and his mobility concerns. [name] indicated that he really does not want to need the wheelchair; he would rather simply walk with his prosthesis and would like to work more on walking; he also indicated that his legs hurt more when they were in the lower, dependent position; what makes this difficult is that he tends to decline working on walking due to pain with standing;  Once he transferred to his room on 5W., OT arrived as well, and pt agreed to transfer out of bed to the recliner, which was locked directly against the bed, facing the bed. He made this transfer smoothly, and seated scooting transfers appear very much less painful for him; the transfer to the wheelchair would be done much like what he demonstrated quite well today   If plan is discharge home, recommend the following: A little help with  walking and/or transfers;Help with stairs or ramp for entrance;Assist for transportation   Can travel by private vehicle        Equipment Recommendations  BSC/3in1 (drop-arm BSC)    Recommendations for Other Services Other (comment) (TOC -- Will need to know what other supports are possible for him at home)     Precautions / Restrictions Precautions Precautions: Fall Recall of Precautions/Restrictions: Impaired Precaution/Restrictions Comments: B BKA; prosthetics in the room Restrictions Weight Bearing Restrictions Per Provider Order: No     Mobility  Bed Mobility   Bed Mobility: Supine to Sit     Supine to sit: Modified independent (Device/Increase time)     General bed mobility comments: sitting up in bed, without back suport    Transfers Overall transfer level: Needs assistance             Anterior-Posterior transfers: Contact guard assist  Lateral/Scoot Transfers: Contact guard assist General transfer comment: scooted from bed to recliner using a series of lateral and posterior scooting; stated that type of transfer did not make his LEs hurt    Ambulation/Gait                   Stairs             Wheelchair Mobility     Tilt Bed    Modified Rankin (Stroke Patients Only)       Balance     Sitting balance-Leahy Scale: Good  Communication Communication Communication: No apparent difficulties;Other (comment) Factors Affecting Communication: Non - English speaking, interpreter not available (Raquel, Medical Spanish Interpreter present and facilitated clear communication)  Cognition Arousal: Alert Behavior During Therapy: WFL for tasks assessed/performed                             Following commands: Intact      Cueing Cueing Techniques: Verbal cues (and demonstration)  Exercises      General Comments General comments (skin integrity, edema, etc.): Discussed  possibility of getting sock plys from [facility] to get better prosthesis fit      Pertinent Vitals/Pain Pain Assessment Pain Assessment: Faces Pain Location: lower back and left hip Pain Descriptors / Indicators: Discomfort, Sore Pain Intervention(s): Monitored during session    Home Living                          Prior Function            PT Goals (current goals can now be found in the care plan section) Acute Rehab PT Goals Patient Stated Goal: to improve pain to walk PT Goal Formulation: With patient Time For Goal Achievement: 01/03/24 Potential to Achieve Goals: Good Progress towards PT goals: Progressing toward goals    Frequency    Min 2X/week      PT Plan      Co-evaluation              AM-PAC PT 6 Clicks Mobility   Outcome Measure  Help needed turning from your back to your side while in a flat bed without using bedrails?: None Help needed moving from lying on your back to sitting on the side of a flat bed without using bedrails?: A Lot Help needed moving to and from a bed to a chair (including a wheelchair)?: A Little Help needed standing up from a chair using your arms (e.g., wheelchair or bedside chair)?: A Little Help needed to walk in hospital room?: A Little Help needed climbing 3-5 steps with a railing? : Total 6 Click Score: 16    End of Session   Activity Tolerance: Patient tolerated treatment well Patient left: in chair;with call bell/phone within reach;with chair alarm set Nurse Communication: Mobility status PT Visit Diagnosis: Pain;Other abnormalities of gait and mobility (R26.89) Pain - Right/Left: Right Pain - part of body: Hip;Leg     Time: 1335-1405 PT Time Calculation (min) (ACUTE ONLY): 30 min  Charges:    $Therapeutic Activity: 8-22 mins $Self Care/Home Management: 8-22 PT General Charges $$ ACUTE PT VISIT: 1 Visit                     Silvano Currier, PT  Acute Rehabilitation Services Office  [phone] Secure Chat welcomed    Silvano VEAR Currier 12/31/2023, 3:47 PM

## 2023-12-31 NOTE — Progress Notes (Signed)
 Occupational Therapy Treatment Patient Details Name: Alvin Daniels MRN: [id number] DOB: 01-12-1971 Today's Date: 12/31/2023   History of present illness Patient is a 53 y/o male admitted 12/14/23 with hip pain MRI showing R psoas abscess and L4-L5 discitis and osteomyelitis. 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  PMH includes ESRD on HD MWF, hepC, MRSA, HIV, bilat BKA, DMII.   OT comments  Pt more receptive to performing transfers, today pt performed AP transfers and lateral scooting with CGA demonstrating good use of UB strength to complete. He does report trouble with identifying words on his phone screen, assisted pt with enlarging screen images/fonts. OT to continue efforts to progress pt as able, hopeful to establish UE HEP soon if pt is more receptive to reduce risk of UE weakness with his lack of mobility.       If plan is discharge home, recommend the following:  Assistance with cooking/housework;A little help with bathing/dressing/bathroom   Equipment Recommendations  None recommended by OT (Pt declined additional DME)    Recommendations for Other Services      Precautions / Restrictions Precautions Precautions: Fall Recall of Precautions/Restrictions: Impaired Precaution/Restrictions Comments: B BKA; prosthetics in the room Restrictions Weight Bearing Restrictions Per Provider Order: No       Mobility Bed Mobility   Bed Mobility: Supine to Sit     Supine to sit: Modified independent (Device/Increase time)     General bed mobility comments: sitting up in bed, without back suport    Transfers Overall transfer level: Needs assistance             Anterior-Posterior transfers: Contact guard assist  Lateral/Scoot Transfers: Contact guard assist General transfer comment: scooted from bed to recliner using a series of lateral and posterior scooting; stated that type of  transfer did not make his LEs hurt     Balance Overall balance assessment: Needs assistance Sitting-balance support: Feet unsupported, Bilateral upper extremity supported Sitting balance-Leahy Scale: Good                                     ADL either performed or assessed with clinical judgement   ADL                                         General ADL Comments: Pt receptive to transferring in bed, attempted to have pt demonstrate his exercises again but interpreter reports that he drifted off to a different topic surrounding his pain.    Extremity/Trunk Assessment              Vision Baseline Vision/History:  (unsure) Vision Assessment?: Vision impaired- to be further tested in functional context Additional Comments: Pt reports being able to see guests in room without challenge, notes a problem with seeing his cell phone text. Enlarged size of pt apps and texts, he reports improved ability to see text   Perception     Praxis     Communication Communication Communication: No apparent difficulties;Other (comment) Factors Affecting Communication: Non - English speaking, interpreter not available (Raquel, Medical Spanish Interpreter present and facilitated clear communication)   Cognition Arousal: Alert Behavior During Therapy: WFL for tasks assessed/performed Cognition: No apparent impairments  Following commands: Intact        Cueing   Cueing Techniques: Verbal cues, Visual cues  Exercises      Shoulder Instructions       General Comments Discussed possibility of getting sock plys from [facility] to get better prosthesis fit    Pertinent Vitals/ Pain       Pain Assessment Pain Assessment: Faces Faces Pain Scale: Hurts even more Pain Location: lower back and left hip Pain Descriptors / Indicators: Discomfort, Sore Pain Intervention(s): Monitored during session,  Repositioned  Home Living                                          Prior Functioning/Environment              Frequency  Min 2X/week        Progress Toward Goals  OT Goals(current goals can now be found in the care plan section)  Progress towards OT goals: Progressing toward goals (more open to transfers)  Acute Rehab OT Goals Patient Stated Goal: To have pain go down then return home OT Goal Formulation: With patient Time For Goal Achievement: 01/07/24 Potential to Achieve Goals: Good  Plan      Co-evaluation                 AM-PAC OT 6 Clicks Daily Activity     Outcome Measure   Help from another person eating meals?: A Little Help from another person taking care of personal grooming?: A Little Help from another person toileting, which includes using toliet, bedpan, or urinal?: A Little Help from another person bathing (including washing, rinsing, drying)?: A Little Help from another person to put on and taking off regular upper body clothing?: A Little Help from another person to put on and taking off regular lower body clothing?: A Little 6 Click Score: 18    End of Session    OT Visit Diagnosis: Pain;Other abnormalities of gait and mobility (R26.89);Unsteadiness on feet (R26.81) Pain - Right/Left: Left Pain - part of body:  (BKA)   Activity Tolerance Patient tolerated treatment well   Patient Left with call bell/phone within reach;in chair;with chair alarm set   Nurse Communication Mobility status        Time: 8593-8577 OT Time Calculation (min): 16 min  Charges: OT General Charges $OT Visit: 1 Visit OT Treatments $Therapeutic Activity: 8-22 mins  12/31/2023  AB, OTR/L  Acute Rehabilitation Services  Office: [phone]   [name] 12/31/2023, 4:47 PM

## 2023-12-31 NOTE — Consult Note (Signed)
 Reason for consult:  HPI: Alvin Daniels is an 53 y.o. male with extensive PMH most notable for HIV, ESRD, HepC, DM, admitted with a R psoas abscess and L4-L5 myelitis who we are asked to evaluate for blurred vision OD.  With the use of iPad Spanish video interpreter service, the patient reports over this hospital admission his vision OD can become blurry at times.  He denies eye pain or redness.  No flashes or floaters.   Notably the patient lost all useful vision OS (on review of Epic notes) ~2024 thought 2/2 corneal ulcer ?     Past Medical History:  Diagnosis Date   Acute bilateral low back pain 01/03/2021   AIDS ([name]) 11/22/2014   Back pain 06/09/2019   BKA stump complication ([name]) 06/09/2019   Chronic diarrhea    Chronic hepatitis C without hepatic coma ([name]) 11/22/2014   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[name])    Right   Corneal ulcer 12/18/2022   Descemetocele of left eye 12/18/2022   Diabetic neuropathy ([name])    ESRD (end stage renal disease) on dialysis ([name])    TTS; don't remember street name (05/03/2014)   Hepatitis C    HIV INFECTION 06/27/2010   Qualifier: Diagnosis of  By: Renae CMA ( AAMA), Jacqueline     Hypotension 06/02/2012   Metabolic bone disease 06/18/2012   MRSA infection    Normocytic anemia 06/17/2012   Pancreatitis    Pressure ulcer of BKA stump, stage 2 ([name]) 11/22/2014   Severe protein-calorie malnutrition ([name]) 06/17/2012   Uncontrolled diabetes mellitus with complications 06/27/2010   Annotation: uncontrolled Qualifier: Diagnosis of  By: Renae CMA ETTER FRUIT), Jacqueline     Vaccine counseling 05/29/2022   Past Surgical History:  Procedure Laterality Date   AMPUTATION Left 04/20/2014   Procedure: 3rd toe amputation, 4th Toe Amputation,  5th Toe Amputation;  Surgeon: Jerona Harden GAILS, MD;  Location: [facility] OR;  Service: Orthopedics;  Laterality: Left;   AMPUTATION Left 05/02/2014   Procedure: Midfoot Amputation;  Surgeon: Jerona Harden GAILS,  MD;  Location: [facility] OR;  Service: Orthopedics;  Laterality: Left;   AMPUTATION Left 06/17/2014   Procedure: AMPUTATION BELOW KNEE;  Surgeon: Jerona Harden GAILS, MD;  Location: [facility] OR;  Service: Orthopedics;  Laterality: Left;   AMPUTATION Right 09/04/2018   Procedure: RIGHT BELOW KNEE AMPUTATION;  Surgeon: Harden Jerona GAILS, MD;  Location: [facility] OR;  Service: Orthopedics;  Laterality: Right;  RIGHT BELOW KNEE AMPUTATION   AMPUTATION FINGER Left 10/09/2023   Procedure: AMPUTATION, FINGER;  Surgeon: Romona Harari, MD;  Location: [facility] OR;  Service: Orthopedics;  Laterality: Left;  Left Ring Finger amputation   AV FISTULA PLACEMENT Left    AV FISTULA PLACEMENT Left 05/10/2016   Procedure: Creation Left Arm Brachiocephalic Arteriovenous Fistula and Ligation of Radiocephalic Fistula;  Surgeon: Lonni GORMAN Blade, MD;  Location: [facility] OR;  Service: Vascular;  Laterality: Left;   DIALYSIS/PERMA CATHETER INSERTION N/A 09/01/2023   Procedure: DIALYSIS/PERMA CATHETER INSERTION;  Surgeon: Melia Lynwood ORN, MD;  Location: [facility] INVASIVE CV LAB;  Service: Cardiovascular;  Laterality: N/A;   FEMUR IM NAIL Left 08/17/2016   Procedure: INTRAMEDULLARY (IM) RETROGRADE FEMORAL NAILING;  Surgeon: Oneil JAYSON Herald, MD;  Location: [facility] OR;  Service: Orthopedics;  Laterality: Left;   FOOT AMPUTATION THROUGH ANKLE Left 12/'21/2015   midfoot   IR AV DIALY SHUNT INTRO NEEDLE/INTRACATH INITIAL W/PTA/IMG LEFT  04/17/2023   IR FLUORO GUIDE CV LINE RIGHT  10/14/2023   IR FLUORO GUIDE CV LINE  RIGHT  11/11/2023   IR GENERIC HISTORICAL Left 05/01/2016   IR THROMBECTOMY AV FISTULA W/THROMBOLYSIS/PTA INC/SHUNT/IMG LEFT 05/01/2016 Toribio Faes, MD [facility]   IR GENERIC HISTORICAL  05/01/2016   IR US  GUIDE VASC ACCESS LEFT 05/01/2016 Toribio Faes, MD [facility]   IR GENERIC HISTORICAL  05/07/2016   IR FLUORO GUIDE CV LINE RIGHT 05/07/2016 Ami Bellman, DO [facility]   IR GENERIC HISTORICAL  05/07/2016   IR US  GUIDE VASC ACCESS RIGHT 05/07/2016 Ami Bellman, DO [facility]   IR GENERIC HISTORICAL  05/22/2016   IR US  GUIDE VASC ACCESS RIGHT 05/22/2016 Ozell Specking, MD [facility]   IR GENERIC HISTORICAL  05/22/2016   IR FLUORO GUIDE CV LINE RIGHT 05/22/2016 Ozell Specking, MD [facility]   IR REMOVAL TUN CV CATH W/O FL  08/21/2016   IR REMOVAL TUN CV CATH W/O FL  10/12/2023   IR US  GUIDE VASC ACCESS LEFT  04/17/2023   IR US  GUIDE VASC ACCESS RIGHT  10/14/2023   PERIPHERAL VASCULAR CATHETERIZATION Left 05/09/2016   Procedure: A/V Fistulagram;  Surgeon: Lonni GORMAN Blade, MD;  Location: [facility] INVASIVE CV LAB;  Service: Cardiovascular;  Laterality: Left;  arm   TEE WITHOUT CARDIOVERSION N/A 06/22/2018   Procedure: TRANSESOPHAGEAL ECHOCARDIOGRAM (TEE);  Surgeon: Jeffrie Oneil BROCKS, MD;  Location: [facility] ENDOSCOPY;  Service: Cardiovascular;  Laterality: N/A;   TRANSESOPHAGEAL ECHOCARDIOGRAM (CATH LAB) N/A 10/15/2023   Procedure: TRANSESOPHAGEAL ECHOCARDIOGRAM;  Surgeon: Rolan Ezra GORMAN, MD;  Location: [facility] INVASIVE CV LAB;  Service: Cardiovascular;  Laterality: N/A;   Family History  Problem Relation Age of Onset   Diabetes Mother    Diabetes Father    Current Facility-Administered Medications  Medication Dose Route Frequency Provider Last Rate Last Admin   acetaminophen  (TYLENOL ) tablet 1,000 mg  1,000 mg Oral TID Georgina Basket, MD   1,000 mg at 12/31/23 1040   bictegravir-emtricitabine -tenofovir  AF (BIKTARVY ) 50-200-25 MG per tablet 1 tablet  1 tablet Oral Daily Georgina Basket, MD   1 tablet at 12/31/23 1040   [START ON 01/01/2024] ceFEPIme  (MAXIPIME ) 2 g in sodium chloride  0.9 % 100 mL IVPB  2 g Intravenous Once in dialysis Perri DELENA Meliton Mickey., MD       NOREEN ON 01/02/2024] ceFEPIme  (MAXIPIME ) 2 g in sodium chloride  0.9 % 100 mL IVPB  2 g Intravenous Q M,W,F-HD Perri DELENA Meliton Mickey., MD       [START ON 01/01/2024] Chlorhexidine  Gluconate Cloth 2 % PADS 6 each  6 each Topical Q0600 Fritz Belvie DEL, NP       [START ON 01/02/2024] Darbepoetin Alfa   (ARANESP ) injection 100 mcg  100 mcg Subcutaneous Q Fri-1800 Penninger, Manuelita, [place]       gabapentin  (NEURONTIN ) capsule 100 mg  100 mg Oral TID Georgina Basket, MD   100 mg at 12/31/23 1728   glucagon  (human recombinant) (GLUCAGEN) injection 1 mg  1 mg Intravenous PRN Amin, Ankit C, MD       heparin  injection 5,000 Units  5,000 Units Subcutaneous Q8H Georgina Basket, MD   5,000 Units at 12/31/23 1318   heparin  sodium (porcine) injection 4,000 Units  4,000 Units Intracatheter Once Geralynn Charleston, MD       hydrALAZINE  (APRESOLINE ) injection 10 mg  10 mg Intravenous Q4H PRN Amin, Ankit C, MD       insulin  aspart (novoLOG ) injection 0-5 Units  0-5 Units Subcutaneous QHS Odell Celinda Balo, MD   2 Units at 12/28/23 2152   insulin  aspart (novoLOG ) injection  0-6 Units  0-6 Units Subcutaneous TID WC Odell Celinda Balo, MD   2 Units at 12/31/23 1700   ipratropium-albuterol  (DUONEB) 0.5-2.5 (3) MG/3ML nebulizer solution 3 mL  3 mL Nebulization Q4H PRN Amin, Ankit C, MD       lidocaine  (LIDODERM ) 5 % 1 patch  1 patch Transdermal Q24H Arlon Carliss ORN, DO   1 patch at 12/31/23 1319   methocarbamol  (ROBAXIN ) tablet 500 mg  500 mg Oral TID Georgina Basket, MD   500 mg at 12/31/23 1728   metoprolol  tartrate (LOPRESSOR ) injection 5 mg  5 mg Intravenous Q4H PRN Amin, Ankit C, MD       midodrine  (PROAMATINE ) tablet 10 mg  10 mg Oral TID WC Amin, Ankit C, MD   10 mg at 12/31/23 1318   morphine  (MS CONTIN ) 12 hr tablet 15 mg  15 mg Oral Q12H Amin, Ankit C, MD   15 mg at 12/31/23 0126   ondansetron  (ZOFRAN ) injection 4 mg  4 mg Intravenous Q6H PRN Amin, Ankit C, MD       oxyCODONE  (Oxy IR/ROXICODONE ) immediate release tablet 15 mg  15 mg Oral Q6H PRN Amin, Ankit C, MD   15 mg at 12/31/23 1507   oxyCODONE  (Oxy IR/ROXICODONE ) immediate release tablet 15 mg  15 mg Oral Q24H Amin, Ankit C, MD   15 mg at 12/31/23 0816   sevelamer  carbonate (RENVELA ) tablet 800 mg  800 mg Oral TID WC Fritz Belvie DEL, NP   800 mg at  12/31/23 1728   sodium chloride  flush (NS) 0.9 % injection 10-40 mL  10-40 mL Intracatheter Q12H Georgina Basket, MD   10 mL at 12/31/23 1040   sodium chloride  flush (NS) 0.9 % injection 10-40 mL  10-40 mL Intracatheter PRN Georgina Basket, MD       [START ON 01/01/2024] vancomycin  (VANCOREADY) IVPB 500 mg/100 mL  500 mg Intravenous Q T,Th,Sa-HD Perri DELENA Meliton Mickey., MD       NOREEN ON 01/02/2024] vancomycin  (VANCOREADY) IVPB 500 mg/100 mL  500 mg Intravenous Q M,W,F-HD Perri DELENA Meliton Mickey., MD       No Known Allergies Social History   Socioeconomic History   Marital status: Single    Spouse name: Not on file   Number of children: Not on file   Years of education: Not on file   Highest education level: Not on file  Occupational History   Not on file  Tobacco Use   Smoking status: Never   Smokeless tobacco: Never  Vaping Use   Vaping status: Never Used  Substance and Sexual Activity   Alcohol  use: No   Drug use: No   Sexual activity: Not Currently    Comment: declined condoms  Other Topics Concern   Not on file  Social History Narrative   ** Merged History Encounter **       Social Drivers of Health   Financial Resource Strain: Low Risk  (01/29/2023)   Overall Financial Resource Strain (CARDIA)    Difficulty of Paying Living Expenses: Not very hard  Food Insecurity: No Food Insecurity (12/15/2023)   Hunger Vital Sign    Worried About Running Out of Food in the Last Year: Never true    Ran Out of Food in the Last Year: Never true  Transportation Needs: No Transportation Needs (12/15/2023)   PRAPARE - Administrator, Civil Service (Medical): No    Lack of Transportation (Non-Medical): No  Physical Activity: Inactive (01/29/2023)  Exercise Vital Sign    Days of Exercise per Week: 0 days    Minutes of Exercise per Session: 0 min  Stress: No Stress Concern Present (01/29/2023)   [employer] of Occupational Health - Occupational Stress Questionnaire    Feeling of  Stress : Not at all  Social Connections: Moderately Isolated (10/08/2023)   Social Connection and Isolation Panel    Frequency of Communication with Friends and Family: More than three times a week    Frequency of Social Gatherings with Friends and Family: More than three times a week    Attends Religious Services: More than 4 times per year    Active Member of [employer] or Organizations: No    Attends Banker Meetings: Never    Marital Status: Never married  Intimate Partner Violence: Not At Risk (12/15/2023)   Humiliation, Afraid, Rape, and Kick questionnaire    Fear of Current or Ex-Partner: No    Emotionally Abused: No    Physically Abused: No    Sexually Abused: No    Review of systems: Per primary team's progress note from today, reviewed.   Physical Exam:  Blood pressure 122/74, pulse 89, temperature 97.9 F (36.6 C), temperature source Oral, resp. rate 18, height 4' 11 (1.499 m), weight 42.1 kg, SpO2 100%.   [place]  OD HM (guessed multiple times at [facility] but was wrong each time)  OS  NLP  Pupils:   OD round, non reactive to light  IOP (T pen)  OD   15    Motility:  OD full ductions  OS full ductions    Bedside lighted examination:                                 OD                                       External/adnexa: Normal                                      Lids/lashes:        Normal                                      Conjunctiva        White, quiet        Cornea:              small around epithelial defect ~42mm; no infiltrate                   AC:                     Deep, quiet                                Iris:                     Normal        Lens:                  ~85% cortical and PSC cataract  OS                                       External/adnexa: Normal                                      Lids/lashes:        Normal                                      Conjunctiva        White, quiet        Cornea:               opacified; conjunctivalized     Dilated fundus exam: (Neo 2.5; Myd 1%)      OD +red reflex otherwise no view of fundus, vit, etc.                                                    Labs/studies: Results for orders placed or performed during the hospital encounter of 12/14/23 (from the past 48 hours)  Glucose, capillary     Status: Abnormal   Collection Time: 12/29/23 10:00 PM  Result Value Ref Range   Glucose-Capillary 117 (H) 70 - 99 mg/dL    Comment: Glucose reference range applies only to samples taken after fasting for at least 8 hours.  CBC     Status: Abnormal   Collection Time: 12/30/23 12:00 AM  Result Value Ref Range   WBC 8.5 4.0 - 10.5 K/uL   RBC 2.79 (L) 4.22 - 5.81 MIL/uL   Hemoglobin 8.7 (L) 13.0 - 17.0 g/dL   HCT 72.1 (L) 60.9 - 47.9 %   MCV 99.6 80.0 - 100.0 fL   MCH 31.2 26.0 - 34.0 pg   MCHC 31.3 30.0 - 36.0 g/dL   RDW 84.3 (H) 88.4 - 84.4 %   Platelets 283 150 - 400 K/uL   nRBC 0.0 0.0 - 0.2 %    Comment: Performed at [facility], [address] [place], [place] [address]  Glucose, capillary     Status: Abnormal   Collection Time: 12/30/23  7:25 AM  Result Value Ref Range   Glucose-Capillary 131 (H) 70 - 99 mg/dL    Comment: Glucose reference range applies only to samples taken after fasting for at least 8 hours.  Basic metabolic panel with GFR     Status: Abnormal   Collection Time: 12/30/23  8:21 AM  Result Value Ref Range   Sodium 128 (L) 135 - 145 mmol/L   Potassium 4.6 3.5 - 5.1 mmol/L   Chloride 90 (L) 98 - 111 mmol/L   CO2 22 22 - 32 mmol/L   Glucose, Bld 157 (H) 70 - 99 mg/dL    Comment: Glucose reference range applies only to samples taken after fasting for at least 8 hours.   BUN 35 (H) 6 - 20 mg/dL   Creatinine, Ser 2.94 (H) 0.61 - 1.24 mg/dL   Calcium  9.4 8.9 - 10.3 mg/dL   GFR, Estimated 9 (L) >60 mL/min    Comment: (  NOTE) Calculated using the CKD-EPI Creatinine Equation (2021)    Anion gap 16 (H) 5 - 15     Comment: Performed at [facility], [address] [place], [place] [address]  Magnesium      Status: None   Collection Time: 12/30/23  8:21 AM  Result Value Ref Range   Magnesium  2.4 1.7 - 2.4 mg/dL    Comment: Performed at [facility], [address] [place], [place] [address]  Glucose, capillary     Status: Abnormal   Collection Time: 12/30/23 12:18 PM  Result Value Ref Range   Glucose-Capillary 127 (H) 70 - 99 mg/dL    Comment: Glucose reference range applies only to samples taken after fasting for at least 8 hours.  Glucose, capillary     Status: Abnormal   Collection Time: 12/30/23  4:41 PM  Result Value Ref Range   Glucose-Capillary 131 (H) 70 - 99 mg/dL    Comment: Glucose reference range applies only to samples taken after fasting for at least 8 hours.  Glucose, capillary     Status: Abnormal   Collection Time: 12/30/23  8:58 PM  Result Value Ref Range   Glucose-Capillary 192 (H) 70 - 99 mg/dL    Comment: Glucose reference range applies only to samples taken after fasting for at least 8 hours.  Glucose, capillary     Status: Abnormal   Collection Time: 12/31/23  6:25 AM  Result Value Ref Range   Glucose-Capillary 165 (H) 70 - 99 mg/dL    Comment: Glucose reference range applies only to samples taken after fasting for at least 8 hours.  Glucose, capillary     Status: Abnormal   Collection Time: 12/31/23 12:47 PM  Result Value Ref Range   Glucose-Capillary 185 (H) 70 - 99 mg/dL    Comment: Glucose reference range applies only to samples taken after fasting for at least 8 hours.  Glucose, capillary     Status: Abnormal   Collection Time: 12/31/23  6:50 PM  Result Value Ref Range   Glucose-Capillary 201 (H) 70 - 99 mg/dL    Comment: Glucose reference range applies only to samples taken after fasting for at least 8 hours.   MR KNEE LEFT WO CONTRAST Result Date: 12/31/2023 MR KNEE WITHOUT IV CONTRAST COMPARISON: None. CLINICAL HISTORY: Below-the-knee amputation.  Sepsis. PULSE SEQUENCES: Ax PD FS, Sag T2 ACL, Sag PD FS, Cor PD FS & COR T1 FINDINGS: Below-the-knee amputation changes are present. There is an intramedullary rod in the distal femur. There is a healed distal femur fracture. No significant joint effusion is present. The ACL is not well seen. The PCL is intact. MCL and fibular collateral ligaments are unremarkable. Menisci are unremarkable. IMPRESSION: BKA without significant abnormality. Postsurgical changes are seen in distal femur with a healed distal femur fracture. Mild atrophy and myositis is present without drainable collection. Electronically signed by: Norleen Satchel MD 12/31/2023 07:52 PM EDT RP Workstation: [id number]   MR HIP LEFT WO CONTRAST Result Date: 12/31/2023 MR HIP WITHOUT IV CONTRAST LEFT COMPARISON: MRI lumbar spine 12/19/2023 CLINICAL HISTORY: History of bacteremia. Discitis/osteomyelitis. PULSE SEQUENCES: AX T1, Ax T2 FS, Cor T1, COR STIR & SMALL FOV COR PD FS with contrast. FINDINGS: There is mild degenerative arthrosis of the left hip with slight joint space loss and small joint effusion. The reconstructed process. Intramedullary rod is seen in the left femur. There is abnormal marrow signal in the pelvis and sacrum. This is likely related to anemia.  There is fluid identified in the lower lumbar spine which appears to be at the L4-5 level with extension in the psoas muscles. There are likely bilateral psoas abscesses. Findings are consistent with discitis and osteomyelitis with associated psoas abscesses. Relatively stable to slightly progressed when compared with the recent MRI of the lumbar spine. Musculotendinous structures demonstrate mild myositis in bilateral hips which is nonspecific. IMPRESSION: Discitis osteomyelitis of the lower lumbar spine with extension in psoas muscles consistent with psoas muscle abscesses. See MRI lumbar spine for more detail. Repeat L-spine MRI may be beneficial as this appears to have slightly progressed.  There is likely reconversion red marrow in the sacrum and pelvis. Mild degenerative changes are seen in the hips without significant abnormality. Moderate myositis in the muscles which is nonspecific. Follow-up if symptoms progress. Electronically signed by: Norleen Satchel MD 12/31/2023 07:50 PM EDT RP Workstation: [id number]                             Assessment and Plan: Christobal Fargnoli is an 53 y.o. male with extensive PMH most notable for HIK, Hep C, ESRD, DM, admitted with a R psoas abscess and L4-L5 myelitis who we are asked to evaluate for blurred vision OD with:   -- Small round epithelial defect OD.    Etiology not clear but on my bedside exam, which is limited, this looks consistent with neurotrophic keratopathy.      Recommend:       1) Prophy with Moxifloxacin BID OD      2) Oph lubricant ointment QID OD  Will need repeat exam to ensure healing.  I've discussed symptoms that should prompt him to speak up to primary team and nursing staff to which they can alert me.    All of the above information was relayed to the patient via interpreter.      All questions were answered.   Arlyss King 12/31/2023, 8:16 PM  [facility] [phone]

## 2023-12-31 NOTE — Significant Event (Signed)
 Fall note:   Notified by RN of fall. Was using bedside commode, straining. Per RN thinks may have vagaled and had brief syncopal episode. Was able to catch himself with arms by time was near ground. Witnessed, did not hit his head. C/o pain in his left thigh, not acute. And no other sites of pain. On my exam head / C spine are atraumatic. No focal deficits. Able to range his l hip joint, reports subacute pain unchanged, rest of extremities range without pain. No additional imaging needed at this time. Maintain fall precautions.   Dorn Dawson, MD  [facility]

## 2023-12-31 NOTE — Progress Notes (Addendum)
 PROGRESS NOTE    Alvin Daniels  FMW:[id number] DOB: 1971-02-12 DOA: 12/14/2023 PCP: Pcp, No  Chief Complaint  Patient presents with   Hip Pain    Brief Narrative:   53 y.o. 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 coli and Streptococcus anginosus who presents with hip pain MRI was done that showed Lovelace Cerveny right psoas abscess and L4-L5 discitis and osteomyelitis.  Evaluated by IR but no safe access and not Izzabelle Bouley surgical candidate.  ID is recommending total 8 weeks of vancomycin  and cefepime  with HD, EOT 02/09/2024 Working on pain control and safe disposition.   Assessment & Plan:   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)   Severe protein-calorie malnutrition ([name])   Normocytic anemia   ESRD on hemodialysis ([name])   S/P bilateral BKA (below knee amputation) ([name])   Chronic hepatitis C without hepatic coma ([name])   HIV (human immunodeficiency virus infection) ([name])   Hx MRSA infection   Osteomyelitis of lumbar spine ([name])   Epidural abscess   Paraspinal abscess ([name])   Pyogenic inflammation of bone ([name])  L4-L5 osteomyelitis/discitis with right psoas abscess - MRI sacrum showing discitis and osteomyelitis at L4-5 with associated paraspinal inflammatory changes and R psoas abscess -- MRI lumbar spine with L4-5 discitis/osteomyelitis with associated paraspinal inflammatory changes, R psoas abscess, and epidural phlegmon - small paraspinal abscess anterior to the L4-5 disc, possible small psoas abscess on L - moderate spinal canal stenosis at L4-5 due to disc buldge and facet arthrosis with moderate to severe R and moderate L  foraminal stenosis -- IR consulted for possible drainage however no safe access appreciated.  Not Paz Winsett surgical candidate per previous notes.  -ID recommendations for patient to receive cefepime  and vancomycin  with HD for total of 8 weeks, EOT 02/09/2024   Bilateral BKA with increased hip/leg pain - Contributing to physical debilitation and muscle weakness.  BL prosthetics in room.  Patient complaining of worsening pain with ambulation.  He has been advised pain will remain an issue and he will need to participate with therapy as best as he can. Increase in pain meds is resulting in low BP and affecting his HD sessions. Midodrine  does seem to help his BP therefore its scheduled for now.  -- will get MRI of left hip and stump  -Ongoing pain management.    MS Contin  15 mg every 12 hours  oxycodone  15 mg IR every morning before working with therapy  oxycodone  IR 15 mg every 6 as needed. Robaxin  500 mg 3 times daily Gabapentin  100 mg 3 times daily   Possible Syncopal Episode  Fall Fell overnight while using bedside commode See 8/20 overnight note by Dr. 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    Chronic hypotension -Renal following. Increased Midodrine .    Uncontrolled diabetes mellitus - Previous A1c 10 suggesting very poor control.  Currently on insulin  sliding scale.   HIV/chronic hep C - ID following.  Continue Biktarvy .   Severe protein calorie malnutrition - Protein supplementation  encouraged.   Goals of care - Disposition planning with patient, TOC, infectious disease, nephrology.  Infectious disease consulted recommending conservative management with IV cefepime  plus vancomycin .  Plan for patient to receive cefepime  plus vancomycin  with HD for total of 8 weeks, end date  02/09/2024.  Repeat MRI recommended by infectious disease as well, to be scheduled in the outpatient setting.  Working closely with TOC to ensure this is all set up prior to discharge home.  SNF not an option given patient is not [place]  citizen.    DVT prophylaxis: heparin  Code Status: full Family Communication: family over phone Disposition:   Status is: Inpatient Remains inpatient appropriate because: need for continued inpatient care   Consultants:  ID renal  Procedures:  none  Antimicrobials:  Anti-infectives (From admission, onward)    Start     Dose/Rate Route Frequency Ordered Stop   01/02/24 1200  ceFEPIme  (MAXIPIME ) 2 g in sodium chloride  0.9 % 100 mL IVPB        2 g 200 mL/hr over 30 Minutes Intravenous Every M-W-F (Hemodialysis) 12/31/23 1221     01/02/24 1200  vancomycin  (VANCOREADY) IVPB 500 mg/100 mL        500 mg 100 mL/hr over 60 Minutes Intravenous Every M-W-F (Hemodialysis) 12/31/23 1221     01/01/24 1200  vancomycin  (VANCOREADY) IVPB 500 mg/100 mL        500 mg 100 mL/hr over 60 Minutes Intravenous Every T-Th-Sa (Hemodialysis) 12/31/23 1219 01/03/24 1159   01/01/24 1200  ceFEPIme  (MAXIPIME ) 2 g in sodium chloride  0.9 % 100 mL IVPB        2 g 200 mL/hr over 30 Minutes Intravenous Once in dialysis 12/31/23 1219     12/30/23 1400  vancomycin  (VANCOREADY) IVPB 500 mg/100 mL        500 mg 100 mL/hr over 60 Minutes Intravenous  Once 12/30/23 1310 12/30/23 2033   12/30/23 1400  ceFEPIme  (MAXIPIME ) 1 g in sodium chloride  0.9 % 100 mL IVPB        1 g 200 mL/hr over 30 Minutes Intravenous  Once 12/30/23 1310 12/30/23 2005   12/29/23 2230  ceFEPIme  (MAXIPIME ) 1 g in sodium chloride  0.9 % 100 mL IVPB        1 g 200 mL/hr over 30 Minutes Intravenous  Once 12/29/23 2131 12/30/23 0656   12/29/23 1200  vancomycin  (VANCOREADY) IVPB 500 mg/100 mL  Status:  Discontinued        500 mg 100 mL/hr over 60 Minutes Intravenous Every M-W-F (Hemodialysis) 12/25/23 1313 12/26/23  1056   12/29/23 1200  ceFEPIme  (MAXIPIME ) 2 g in sodium chloride  0.9 % 100 mL IVPB  Status:  Discontinued        2 g 200 mL/hr over 30 Minutes Intravenous Every M-W-F (Hemodialysis) 12/26/23 1056 12/31/23 1221   12/29/23 1200  vancomycin  (VANCOREADY) IVPB 500 mg/100 mL  Status:  Discontinued        500 mg 100 mL/hr over 60 Minutes Intravenous Every M-W-F (Hemodialysis) 12/26/23 1056 12/31/23 1221   12/27/23 1400  ceFEPIme  (MAXIPIME ) 2 g in sodium chloride  0.9 % 100 mL IVPB        2 g 200 mL/hr over 30 Minutes Intravenous  Once 12/27/23 1319 12/27/23 1443   12/27/23 1200  vancomycin  (VANCOREADY) IVPB 500 mg/100 mL  Status:  Discontinued        500 mg 100 mL/hr over 60 Minutes Intravenous Every Sat (Hemodialysis) 12/25/23 1313 12/26/23 1056   12/27/23 1200  ceFEPIme  (MAXIPIME ) 2 g in sodium chloride  0.9 % 100 mL IVPB  Status:  Discontinued        2 g 200 mL/hr over 30 Minutes Intravenous Every Sat (Hemodialysis) 12/26/23 1056 12/27/23 1318   12/27/23 1200  vancomycin  (VANCOREADY) IVPB 500 mg/100 mL        500 mg 100 mL/hr over 60 Minutes Intravenous Every Sat (Hemodialysis) 12/26/23 1056 12/27/23 1227   12/26/23 1700  ciprofloxacin  (CIPRO ) tablet 500 mg        500 mg Oral  Once 12/26/23 1056 12/26/23 1704   12/25/23 1915  ciprofloxacin  (CIPRO ) tablet 500 mg        500 mg Oral  Once 12/25/23 1819 12/25/23 1849   12/25/23 1800  ceFEPIme  (MAXIPIME ) 1 g in sodium chloride  0.9 % 100 mL IVPB  Status:  Discontinued        1 g 200 mL/hr over 30 Minutes Intravenous Every 24 hours 12/25/23 1316 12/26/23 1056   12/25/23 1200  ceFEPIme  (MAXIPIME ) 2 g in sodium chloride  0.9 % 100 mL IVPB  Status:  Discontinued        2 g 200 mL/hr over 30 Minutes Intravenous Once in dialysis 12/24/23 1739 12/25/23 1316   12/25/23 1200  vancomycin  (VANCOCIN ) IVPB 1000 mg/200 mL premix  Status:  Discontinued        1,000 mg 200 mL/hr over 60 Minutes Intravenous  Once 12/24/23 1739 12/24/23 1809   12/25/23 1200   vancomycin  (VANCOREADY) IVPB 500 mg/100 mL  Status:  Discontinued        500 mg 100 mL/hr over 60 Minutes Intravenous  Once 12/24/23 1809 12/25/23 1313   12/24/23 1200  ceFEPIme  (MAXIPIME ) 2 g in sodium chloride  0.9 % 100 mL IVPB  Status:  Discontinued        2 g 200 mL/hr over 30 Minutes Intravenous Every M-W-F (Hemodialysis) 12/22/23 0851 12/25/23 1316   12/19/23 0000  ceFEPime  (MAXIPIME ) IVPB        2 g Intravenous Every M-W-F (Hemodialysis) 12/19/23 1300 02/09/24 2359   12/19/23 0000  vancomycin  IVPB        500 mg Intravenous Every M-W-F (Hemodialysis) 12/19/23 1300 02/09/24 2359   12/16/23 2200  ceFEPIme  (MAXIPIME ) 1 g in sodium chloride  0.9 % 100 mL IVPB        1 g 200 mL/hr over 30 Minutes Intravenous Every 24 hours 12/15/23 1348 12/22/23 2203   12/15/23 2230  vancomycin  (VANCOREADY) IVPB 500 mg/100 mL  Status:  Discontinued        500 mg 100 mL/hr over 60 Minutes Intravenous Every M-W-F (Hemodialysis) 12/15/23 2217 12/25/23 1313   12/15/23 1400  bictegravir-emtricitabine -tenofovir  AF (BIKTARVY ) 50-200-25 MG per tablet 1 tablet        1 tablet Oral Daily 12/15/23 1307     12/15/23 1347  vancomycin  variable dose per unstable renal function (pharmacist dosing)  Status:  Discontinued         Does not apply See admin instructions 12/15/23 1348 12/15/23 2218   12/15/23 1100  ceFEPIme  (MAXIPIME ) 2 g in sodium chloride  0.9 % 100 mL IVPB        2 g 200 mL/hr over 30 Minutes Intravenous  Once 12/15/23 1048 12/15/23 1130   12/15/23 1100  vancomycin  (VANCOCIN ) IVPB 1000 mg/200 mL premix        1,000 mg 200 mL/hr over 60 Minutes Intravenous  Once 12/15/23 1048 12/15/23 1154       Subjective: Spanish interpreter used C/o L hip  and stump pain   Objective: Vitals:   12/31/23 0452 12/31/23 0812 12/31/23 1247 12/31/23 1423  BP: (!) 128/57 (!) 125/59 101/74 122/74  Pulse: 89 88 88 89  Resp: 20   18  Temp: 98.5 F (36.9 C) 98.6 F (37 C) 98.5 F (36.9 C) 97.9 F (36.6 C)  TempSrc:  Oral Oral Oral Oral  SpO2: 100% 100% 100%   Weight:      Height:       No intake or output data in the 24 hours ending 12/31/23 1521 Filed Weights   12/27/23 0846 12/27/23 1215 12/30/23 0803  Weight: (S) 41.6 kg (S) 41 kg 42.1 kg    Examination:  General exam: Appears calm, but uncomfortable Respiratory system: unlabored Cardiovascular system: RRR Gastrointestinal system: Abdomen is nondistended, soft and nontender.  Central nervous system: Alert and oriented. No focal neurological deficits. Extremities: bilateral BKA - ttp to left hip and stump    Data Reviewed: I have personally reviewed following labs and imaging studies  CBC: Recent Labs  Lab 12/25/23 0615 12/26/23 0445 12/27/23 0513 12/30/23 0000  WBC 6.4 7.1 8.3 8.5  HGB 8.8* 9.4* 9.2* 8.7*  HCT 27.2* 28.9* 28.7* 27.8*  MCV 99.3 99.3 99.3 99.6  PLT 232 269 329 283    Basic Metabolic Panel: Recent Labs  Lab 12/25/23 0615 12/26/23 0445 12/27/23 0513 12/28/23 0450 12/29/23 0820 12/30/23 0821  NA 126* 131* 130* 132* 130* 128*  K 4.1 4.2 4.7 4.0 4.6 4.6  CL 91* 94* 92* 94* 92* 90*  CO2 21* 24 23 23 23 22   GLUCOSE 170* 121* 144* 160* 168* 157*  BUN 37* 17 26* 15 25* 35*  CREATININE 7.56* 4.64* 6.30* 4.13* 5.68* 7.05*  CALCIUM  9.2 8.9 9.4 9.1 9.5 9.4  MG 2.3 2.0 2.2  --   --  2.4    GFR: Estimated Creatinine Clearance: 7.2 mL/min (Itzia Cunliffe) (by C-G formula based on SCr of 7.05 mg/dL (H)).  Liver Function Tests: No results for input(s): AST, ALT, ALKPHOS, BILITOT, PROT, ALBUMIN  in the last 168 hours.  CBG: Recent Labs  Lab 12/30/23 1218 12/30/23 1641 12/30/23 2058 12/31/23 0625 12/31/23 1247  GLUCAP 127* 131* 192* 165* 185*     No results found for this or any previous visit (from the past 240 hours).       Radiology Studies: No results found.      Scheduled Meds:  acetaminophen   1,000 mg Oral TID   bictegravir-emtricitabine -tenofovir  AF  1 tablet Oral Daily   [START ON  01/01/2024] Chlorhexidine  Gluconate Cloth  6 each Topical Q0600   [START ON 01/02/2024] darbepoetin (ARANESP ) injection - DIALYSIS  100 mcg Subcutaneous Q Fri-1800   gabapentin   100 mg Oral TID   heparin   5,000 Units Subcutaneous Q8H   heparin  sodium (porcine)  4,000 Units Intracatheter Once   insulin  aspart  0-5 Units Subcutaneous QHS   insulin  aspart  0-6 Units Subcutaneous TID WC   lidocaine   1 patch Transdermal Q24H   methocarbamol   500 mg Oral TID   midodrine   10 mg Oral TID WC   morphine   15 mg Oral Q12H   oxyCODONE   15 mg Oral Q24H   sevelamer  carbonate  800 mg Oral TID WC   sodium chloride  flush  10-40 mL Intracatheter Q12H   Continuous Infusions:  [START ON 01/01/2024] ceFEPime  (MAXIPIME ) IV     [START ON 01/02/2024] ceFEPime  (MAXIPIME ) IV     [START ON 01/01/2024] vancomycin      [START  ON 01/02/2024] vancomycin        LOS: 16 days    Time spent: over 30 min     Meliton Monte, MD [facility]   To contact the attending provider between 7A-7P or the covering provider during after hours 7P-7A, please log into the web site [email or web address] and access using universal [place] password for that web site. If you do not have the password, please call the hospital operator.  12/31/2023, 3:21 PM

## 2024-01-01 LAB — GLUCOSE, CAPILLARY
Glucose-Capillary: 138 mg/dL — ABNORMAL HIGH (ref 70–99)
Glucose-Capillary: 190 mg/dL — ABNORMAL HIGH (ref 70–99)
Glucose-Capillary: 109 mg/dL — ABNORMAL HIGH (ref 70–99)
Glucose-Capillary: 166 mg/dL — ABNORMAL HIGH (ref 70–99)

## 2024-01-01 MED ORDER — FENTANYL 25 MCG/HR TD PT72
1.0000 | MEDICATED_PATCH | TRANSDERMAL | Status: DC
  Administered 2024-01-01 – 2024-01-19 (×7): 1 via TRANSDERMAL
  Filled 2024-01-01 (×7): qty 1

## 2024-01-01 MED ORDER — ALBUMIN HUMAN 25 % IV SOLN
25.0000 g | Freq: Once | INTRAVENOUS | Status: AC
  Administered 2024-01-01: 25 g via INTRAVENOUS

## 2024-01-01 MED ORDER — PANTOPRAZOLE SODIUM 40 MG PO TBEC
40.0000 mg | DELAYED_RELEASE_TABLET | Freq: Every day | ORAL | Status: DC
  Administered 2024-01-02 – 2024-01-12 (×11): 40 mg via ORAL
  Filled 2024-01-01 (×11): qty 1

## 2024-01-01 MED ORDER — ALBUMIN HUMAN 25 % IV SOLN
INTRAVENOUS | Status: AC
  Filled 2024-01-01: qty 100

## 2024-01-01 MED ORDER — OXYCODONE HCL 5 MG PO TABS
10.0000 mg | ORAL_TABLET | Freq: Four times a day (QID) | ORAL | Status: DC | PRN
  Administered 2024-01-01 – 2024-02-21 (×74): 10 mg via ORAL
  Filled 2024-01-01 (×72): qty 2

## 2024-01-01 MED ORDER — POLYETHYLENE GLYCOL 3350 17 G PO PACK
17.0000 g | PACK | Freq: Every day | ORAL | Status: DC
  Administered 2024-01-02 – 2024-02-16 (×20): 17 g via ORAL
  Filled 2024-01-01 (×27): qty 1

## 2024-01-01 MED ORDER — LIDOCAINE 5 % EX PTCH
2.0000 | MEDICATED_PATCH | CUTANEOUS | Status: DC
  Administered 2024-01-01 – 2024-02-24 (×53): 2 via TRANSDERMAL
  Filled 2024-01-01 (×56): qty 2

## 2024-01-01 MED ORDER — IBUPROFEN 600 MG PO TABS
600.0000 mg | ORAL_TABLET | Freq: Three times a day (TID) | ORAL | Status: DC
  Administered 2024-01-01 – 2024-01-12 (×29): 600 mg via ORAL
  Filled 2024-01-01 (×30): qty 1

## 2024-01-01 NOTE — Progress Notes (Signed)
 PT Cancellation Note  Patient Details Name: Alvin Daniels MRN: [id number] DOB: 07-12-1970   Cancelled Treatment:    Reason Eval/Treat Not Completed: Patient at procedure or test/unavailable (Pt at HD. Will follow up tomorrow.)   [name] 01/01/2024, 12:03 PM

## 2024-01-01 NOTE — Progress Notes (Addendum)
 PROGRESS NOTE        PATIENT DETAILS Name: Alvin Daniels Age: 53 y.o. Sex: male Date of Birth: 13-Mar-1971 Admit Date: 12/14/2023 Admitting Physician Marsha Ada, MD PCP:Pcp, No  Brief Summary: Patient is a 53 y.o.  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  Significant events: 8/4>> admit to [facility]  Significant studies: 6/4>> TEE: EF 60-65%, 1.8 x 1.2 mass right atrial wall-?vegetation from old HD catheter impinged on RA wall-versus mass/tumor. 8/4>> MRI pelvis: Discitis/osteomyelitis L4-5-right psoas abscess. 8/5>> CT abdomen/pelvis: Discitis/osteomyelitis L4-5 with paraspinal inflammatory changes/right psoas abscess.  8/8>> L4-5 discitis/osteomyelitis with associated paraspinal inflammatory changes, right psoas abscess. 8/20>> MRI left hip: Discitis of the lower lumbar spine with extension into the psoas muscle. 8/20>> BKA-no significant abnormality.  Significant microbiology data: 5/28>> blood culture: MRSA 5/29>> left finger abscess: E. coli/Streptococcus anginosus, Bacteroides fragilis. 8/4>> blood culture: No growth  Procedures: None.  Consults: ID Nephrology Ophthalmology  Subjective: Complains of left hip/left thigh pain.  Objective: Vitals: Blood pressure (!) 141/80, pulse 89, temperature 98.4 F (36.9 C), temperature source Oral, resp. rate 11, height 4' 11 (1.499 m), weight 42.1 kg, SpO2 100%.   Exam: Gen Exam:Alert awake-not in any distress HEENT:atraumatic, normocephalic Chest: B/L clear to auscultation anteriorly CVS:S1S2 regular Abdomen:soft non tender, non distended Extremities: B/L BKA. Neurology: Non focal Skin: no rash  Pertinent  Labs/Radiology:    Latest Ref Rng & Units 12/30/2023   12:00 AM 12/27/2023    5:13 AM 12/26/2023    4:45 AM  CBC  WBC 4.0 - 10.5 K/uL 8.5  8.3  7.1   Hemoglobin 13.0 - 17.0 g/dL 8.7  9.2  9.4   Hematocrit 39.0 - 52.0 % 27.8  28.7  28.9   Platelets 150 - 400 K/uL 283  329  269     Lab Results  Component Value Date   NA 128 (L) 12/30/2023   K 4.6 12/30/2023   CL 90 (L) 12/30/2023   CO2 22 12/30/2023      Assessment/Plan: L4-L5 discitis with surrounding paraspinal inflammatory change and right psoas abscess Recent history of MRSA bacteremia/left fourth finger osteomyelitis May 2025 (strep/Bacteroides/E. coli on bone culture) Likely MRSA-although cultures this admission negative.   Evaluated by ID-recommendations are for vancomycin /cefepime  x 8 weeks-EOT 9/29 Reviewed prior notes-not a surgical candidate-IR unable to drain due to lack of safe window.  Severe low back pain-left hip pain Sciatica physiology due to L4-L5 osteomyelitis-with paraspinal inflammation/right psoas abscess Given ESRD-will avoid morphine -will switch to transdermal fentanyl  Decreased as needed oxycodone  to 10 mg every 6 Continue scheduled Tylenol  Will discuss with nephrology-possibly could use NSAIDs as he is already ESRD. Apply more Lidoderm  patches Continue as needed Robaxin  Given soft blood pressure-narcotics not helping.  Syncope on 8/20 Probably related to soft blood pressure/orthostatic hypotension Telemetry monitoring unremarkable.  Chronic hypotension On midodrine  Narcotics for back pain very not helping. Minimize narcotics as much as possible-avoid further escalation in narcotic dosing.  ESRD on HD MWF Nephrology following.  Normocytic anemia Secondary to ESRD Hb stable Aranesp /iron  defer to nephrology service Follow  PAD-s/p bilateral BKA Normally uses prosthesis Due to severe pain-he has been unable  to ambulate with a prosthesis.  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  Difficult disposition No family-uninsured-history of ESRD with bilateral BKA-currently unable to ambulate with his prostheses-hence unable to be discharged home safely.  Underweight: Estimated body mass index is 18.75 kg/m as calculated from the following:   Height as of this encounter: 4' 11 (1.499 m).   Weight as of this encounter: 42.1 kg.   Code status:   Code Status: Full Code   DVT Prophylaxis: heparin  injection 5,000 Units Start: 12/15/23 1400   Family Communication: None at bedside   Disposition Plan: Status is: Inpatient Remains inpatient appropriate because: Severe of illness   Planned Discharge Destination:Home health   Diet: Diet Order             Diet renal/carb modified with fluid restriction Diet-HS Snack? Nothing; Fluid restriction: 1200 mL Fluid; Room service appropriate? Yes; Fluid consistency: Thin  Diet effective now                     Antimicrobial agents: Anti-infectives (From admission, onward)    Start     Dose/Rate Route Frequency Ordered Stop   01/02/24 1200  ceFEPIme  (MAXIPIME ) 2 g in sodium chloride  0.9 % 100 mL IVPB        2 g 200 mL/hr over 30 Minutes Intravenous Every M-W-F (Hemodialysis) 12/31/23 1221     01/02/24 1200  vancomycin  (VANCOREADY) IVPB 500 mg/100 mL        500 mg 100 mL/hr over 60 Minutes Intravenous Every M-W-F (Hemodialysis) 12/31/23 1221     01/01/24 1200  vancomycin  (VANCOREADY) IVPB 500 mg/100 mL        500 mg 100 mL/hr over 60 Minutes Intravenous Every  T-Th-Sa (Hemodialysis) 12/31/23 1219 01/03/24 1159   01/01/24 1200  ceFEPIme  (MAXIPIME ) 2 g in sodium chloride  0.9 % 100 mL IVPB        2 g 200 mL/hr over 30 Minutes Intravenous Once in dialysis 12/31/23 1219     12/30/23 1400  vancomycin  (VANCOREADY) IVPB 500 mg/100 mL        500 mg 100 mL/hr over 60 Minutes Intravenous  Once 12/30/23 1310 12/30/23 2033   12/30/23 1400  ceFEPIme  (MAXIPIME ) 1 g in sodium chloride  0.9 % 100 mL IVPB        1 g 200 mL/hr over 30 Minutes Intravenous  Once 12/30/23 1310 12/30/23 2005   12/29/23 2230  ceFEPIme  (MAXIPIME ) 1 g in sodium chloride  0.9 % 100 mL IVPB        1 g 200 mL/hr over 30 Minutes Intravenous  Once 12/29/23 2131 12/30/23 0656   12/29/23 1200  vancomycin  (VANCOREADY) IVPB 500 mg/100 mL  Status:  Discontinued        500 mg 100 mL/hr over 60 Minutes Intravenous Every M-W-F (Hemodialysis) 12/25/23 1313 12/26/23 1056   12/29/23 1200  ceFEPIme  (MAXIPIME ) 2 g in sodium chloride  0.9 % 100 mL IVPB  Status:  Discontinued        2 g 200 mL/hr over 30 Minutes Intravenous Every M-W-F (Hemodialysis) 12/26/23  1056 12/31/23 1221   12/29/23 1200  vancomycin  (VANCOREADY) IVPB 500 mg/100 mL  Status:  Discontinued        500 mg 100 mL/hr over 60 Minutes Intravenous Every M-W-F (Hemodialysis) 12/26/23 1056 12/31/23 1221   12/27/23 1400  ceFEPIme  (MAXIPIME ) 2 g in sodium chloride  0.9 % 100 mL IVPB        2 g 200 mL/hr over 30 Minutes Intravenous  Once 12/27/23 1319 12/27/23 1443   12/27/23 1200  vancomycin  (VANCOREADY) IVPB 500 mg/100 mL  Status:  Discontinued        500 mg 100 mL/hr over 60 Minutes Intravenous Every Sat (Hemodialysis) 12/25/23 1313 12/26/23 1056   12/27/23 1200  ceFEPIme  (MAXIPIME ) 2 g in sodium chloride  0.9 % 100 mL IVPB  Status:  Discontinued        2 g 200 mL/hr over 30 Minutes Intravenous Every Sat (Hemodialysis) 12/26/23 1056 12/27/23 1318   12/27/23 1200  vancomycin  (VANCOREADY) IVPB 500 mg/100 mL        500 mg 100 mL/hr over 60 Minutes  Intravenous Every Sat (Hemodialysis) 12/26/23 1056 12/27/23 1227   12/26/23 1700  ciprofloxacin  (CIPRO ) tablet 500 mg        500 mg Oral  Once 12/26/23 1056 12/26/23 1704   12/25/23 1915  ciprofloxacin  (CIPRO ) tablet 500 mg        500 mg Oral  Once 12/25/23 1819 12/25/23 1849   12/25/23 1800  ceFEPIme  (MAXIPIME ) 1 g in sodium chloride  0.9 % 100 mL IVPB  Status:  Discontinued        1 g 200 mL/hr over 30 Minutes Intravenous Every 24 hours 12/25/23 1316 12/26/23 1056   12/25/23 1200  ceFEPIme  (MAXIPIME ) 2 g in sodium chloride  0.9 % 100 mL IVPB  Status:  Discontinued        2 g 200 mL/hr over 30 Minutes Intravenous Once in dialysis 12/24/23 1739 12/25/23 1316   12/25/23 1200  vancomycin  (VANCOCIN ) IVPB 1000 mg/200 mL premix  Status:  Discontinued        1,000 mg 200 mL/hr over 60 Minutes Intravenous  Once 12/24/23 1739 12/24/23 1809   12/25/23 1200  vancomycin  (VANCOREADY) IVPB 500 mg/100 mL  Status:  Discontinued        500 mg 100 mL/hr over 60 Minutes Intravenous  Once 12/24/23 1809 12/25/23 1313   12/24/23 1200  ceFEPIme  (MAXIPIME ) 2 g in sodium chloride  0.9 % 100 mL IVPB  Status:  Discontinued        2 g 200 mL/hr over 30 Minutes Intravenous Every M-W-F (Hemodialysis) 12/22/23 0851 12/25/23 1316   12/19/23 0000  ceFEPime  (MAXIPIME ) IVPB        2 g Intravenous Every M-W-F (Hemodialysis) 12/19/23 1300 02/09/24 2359   12/19/23 0000  vancomycin  IVPB        500 mg Intravenous Every M-W-F (Hemodialysis) 12/19/23 1300 02/09/24 2359   12/16/23 2200  ceFEPIme  (MAXIPIME ) 1 g in sodium chloride  0.9 % 100 mL IVPB        1 g 200 mL/hr over 30 Minutes Intravenous Every 24 hours 12/15/23 1348 12/22/23 2203   12/15/23 2230  vancomycin  (VANCOREADY) IVPB 500 mg/100 mL  Status:  Discontinued        500 mg 100 mL/hr over 60 Minutes Intravenous Every M-W-F (Hemodialysis) 12/15/23 2217 12/25/23 1313   12/15/23 1400  bictegravir-emtricitabine -tenofovir  AF (BIKTARVY ) 50-200-25 MG per tablet 1 tablet        1  tablet Oral Daily 12/15/23 1307  12/15/23 1347  vancomycin  variable dose per unstable renal function (pharmacist dosing)  Status:  Discontinued         Does not apply See admin instructions 12/15/23 1348 12/15/23 2218   12/15/23 1100  ceFEPIme  (MAXIPIME ) 2 g in sodium chloride  0.9 % 100 mL IVPB        2 g 200 mL/hr over 30 Minutes Intravenous  Once 12/15/23 1048 12/15/23 1130   12/15/23 1100  vancomycin  (VANCOCIN ) IVPB 1000 mg/200 mL premix        1,000 mg 200 mL/hr over 60 Minutes Intravenous  Once 12/15/23 1048 12/15/23 1154        MEDICATIONS: Scheduled Meds:  acetaminophen   1,000 mg Oral TID   artificial tears   Right Eye QID   bictegravir-emtricitabine -tenofovir  AF  1 tablet Oral Daily   Chlorhexidine  Gluconate Cloth  6 each Topical Q0600   [START ON 01/02/2024] darbepoetin (ARANESP ) injection - DIALYSIS  100 mcg Subcutaneous Q Fri-1800   gabapentin   100 mg Oral TID   gatifloxacin   1 drop Right Eye BID   heparin   5,000 Units Subcutaneous Q8H   insulin  aspart  0-5 Units Subcutaneous QHS   insulin  aspart  0-6 Units Subcutaneous TID WC   lidocaine   1 patch Transdermal Q24H   methocarbamol   500 mg Oral TID   midodrine   10 mg Oral TID WC   morphine   15 mg Oral Q12H   oxyCODONE   15 mg Oral Q24H   sevelamer  carbonate  800 mg Oral TID WC   sodium chloride  flush  10-40 mL Intracatheter Q12H   Continuous Infusions:  ceFEPime  (MAXIPIME ) IV     [START ON 01/02/2024] ceFEPime  (MAXIPIME ) IV     vancomycin      [START ON 01/02/2024] vancomycin      PRN Meds:.glucagon  (human recombinant), hydrALAZINE , ipratropium-albuterol , metoprolol  tartrate, ondansetron  (ZOFRAN ) IV, oxyCODONE , sodium chloride  flush   I have personally reviewed following labs and imaging studies  LABORATORY DATA: CBC: Recent Labs  Lab 12/26/23 0445 12/27/23 0513 12/30/23 0000  WBC 7.1 8.3 8.5  HGB 9.4* 9.2* 8.7*  HCT 28.9* 28.7* 27.8*  MCV 99.3 99.3 99.6  PLT 269 329 283    Basic Metabolic  Panel: Recent Labs  Lab 12/26/23 0445 12/27/23 0513 12/28/23 0450 12/29/23 0820 12/30/23 0821  NA 131* 130* 132* 130* 128*  K 4.2 4.7 4.0 4.6 4.6  CL 94* 92* 94* 92* 90*  CO2 24 23 23 23 22   GLUCOSE 121* 144* 160* 168* 157*  BUN 17 26* 15 25* 35*  CREATININE 4.64* 6.30* 4.13* 5.68* 7.05*  CALCIUM  8.9 9.4 9.1 9.5 9.4  MG 2.0 2.2  --   --  2.4    GFR: Estimated Creatinine Clearance: 7.2 mL/min (A) (by C-G formula based on SCr of 7.05 mg/dL (H)).  Liver Function Tests: No results for input(s): AST, ALT, ALKPHOS, BILITOT, PROT, ALBUMIN  in the last 168 hours. No results for input(s): LIPASE, AMYLASE in the last 168 hours. No results for input(s): AMMONIA in the last 168 hours.  Coagulation Profile: No results for input(s): INR, PROTIME in the last 168 hours.  Cardiac Enzymes: No results for input(s): CKTOTAL, CKMB, CKMBINDEX, TROPONINI in the last 168 hours.  BNP (last 3 results) No results for input(s): PROBNP in the last 8760 hours.  Lipid Profile: No results for input(s): CHOL, HDL, LDLCALC, TRIG, CHOLHDL, LDLDIRECT in the last 72 hours.  Thyroid Function Tests: No results for input(s): TSH, T4TOTAL, FREET4, T3FREE, THYROIDAB in the last 72 hours.  Anemia Panel: No results for input(s): VITAMINB12, FOLATE, FERRITIN, TIBC, IRON , RETICCTPCT in the last 72 hours.  Urine analysis:    Component Value Date/Time   COLORURINE YELLOW 07/04/2010 1840   APPEARANCEUR TURBID (A) 07/04/2010 1840   LABSPEC 1.013 07/04/2010 1840   PHURINE 7.0 07/04/2010 1840   GLUCOSEU NEG mg/dL 97/77/7987 8159   HGBUR TRACE (A) 03/13/2010 1824   BILIRUBINUR NEG 07/04/2010 1840   KETONESUR NEG mg/dL 97/77/7987 8159   PROTEINUR > 300 mg/dL (A) 97/77/7987 8159   UROBILINOGEN 0.2 07/04/2010 1840   NITRITE NEG 07/04/2010 1840   LEUKOCYTESUR LARGE (A) 07/04/2010 1840    Sepsis Labs: Lactic Acid, Venous    Component Value  Date/Time   LATICACIDVEN 1.0 12/15/2023 0741    MICROBIOLOGY: No results found for this or any previous visit (from the past 240 hours).  RADIOLOGY STUDIES/RESULTS: MR KNEE LEFT WO CONTRAST Result Date: 12/31/2023 MR KNEE WITHOUT IV CONTRAST COMPARISON: None. CLINICAL HISTORY: Below-the-knee amputation. Sepsis. PULSE SEQUENCES: Ax PD FS, Sag T2 ACL, Sag PD FS, Cor PD FS & COR T1 FINDINGS: Below-the-knee amputation changes are present. There is an intramedullary rod in the distal femur. There is a healed distal femur fracture. No significant joint effusion is present. The ACL is not well seen. The PCL is intact. MCL and fibular collateral ligaments are unremarkable. Menisci are unremarkable. IMPRESSION: BKA without significant abnormality. Postsurgical changes are seen in distal femur with a healed distal femur fracture. Mild atrophy and myositis is present without drainable collection. Electronically signed by: Norleen Satchel MD 12/31/2023 07:52 PM EDT RP Workstation: [id number]   MR HIP LEFT WO CONTRAST Result Date: 12/31/2023 MR HIP WITHOUT IV CONTRAST LEFT COMPARISON: MRI lumbar spine 12/19/2023 CLINICAL HISTORY: History of bacteremia. Discitis/osteomyelitis. PULSE SEQUENCES: AX T1, Ax T2 FS, Cor T1, COR STIR & SMALL FOV COR PD FS with contrast. FINDINGS: There is mild degenerative arthrosis of the left hip with slight joint space loss and small joint effusion. The reconstructed process. Intramedullary rod is seen in the left femur. There is abnormal marrow signal in the pelvis and sacrum. This is likely related to anemia. There is fluid identified in the lower lumbar spine which appears to be at the L4-5 level with extension in the psoas muscles. There are likely bilateral psoas abscesses. Findings are consistent with discitis and osteomyelitis with associated psoas abscesses. Relatively stable to slightly progressed when compared with the recent MRI of the lumbar spine. Musculotendinous structures  demonstrate mild myositis in bilateral hips which is nonspecific. IMPRESSION: Discitis osteomyelitis of the lower lumbar spine with extension in psoas muscles consistent with psoas muscle abscesses. See MRI lumbar spine for more detail. Repeat L-spine MRI may be beneficial as this appears to have slightly progressed. There is likely reconversion red marrow in the sacrum and pelvis. Mild degenerative changes are seen in the hips without significant abnormality. Moderate myositis in the muscles which is nonspecific. Follow-up if symptoms progress. Electronically signed by: Norleen Satchel MD 12/31/2023 07:50 PM EDT RP Workstation: [id number]     LOS: 17 days   Donalda Applebaum, MD  [facility]    To contact the attending provider between 7A-7P or the covering provider during after hours 7P-7A, please log into the web site [email or web address] and access using universal [place] password for that web site. If you do not have the password, please call the hospital operator.  01/01/2024, 11:39 AM

## 2024-01-01 NOTE — Progress Notes (Signed)
   01/01/24 1603  Vitals  Temp 98.3 F (36.8 C)  Pulse Rate 94  Resp 19  BP 134/80  SpO2 99 %  O2 Device Room Air  Post Treatment  Dialyzer Clearance Lightly streaked  Hemodialysis Intake (mL) 0 mL  Liters Processed 74  Fluid Removed (mL) 1500 mL  Tolerated HD Treatment Yes   Received patient in bed to unit.  Alert and oriented.  Informed consent signed and in chart.   TX duration:3.5HRS  Patient tolerated well.  Transported back to the room  Alert, without acute distress.  Hand-off given to patient's nurse.   Access used: [facility] Access issues: NONE  Total UF removed: 1.5L Medication(s) given: ALBUMIN , VANC    [facility]

## 2024-01-01 NOTE — Progress Notes (Signed)
 [facility] Progress Note   Subjective:  Seen and examined in his room this morning. He reports continued pain in his back and thighs. We spent time discussing that his pain is related to the infection in his Psoas. We discussed with his hospitalist and his medication was changed to fentanyl  and NSAIDs. Plan for HD 01/01/24  Objective Vitals:   01/01/24 0732 01/01/24 0800 01/01/24 1104 01/01/24 1213  BP:  101/66 (!) 141/80 124/83  Pulse:    90  Resp: 18 11  15   Temp: 98.3 F (36.8 C)  98.4 F (36.9 C) 98.1 F (36.7 C)  TempSrc: Oral  Oral   SpO2:      Weight:      Height:       Physical Exam General: Alert, sitting up in bed, nad Heart: RRR  Lungs: nml WOB on RA Abdomen: soft, ND Extremities: b/l BKA, no edema Dialysis Access: [facility]   Filed Weights   12/27/23 0846 12/27/23 1215 12/30/23 0803  Weight: (S) 41.6 kg (S) 41 kg 42.1 kg   No intake or output data in the 24 hours ending 01/01/24 1216   Additional Objective Labs: Basic Metabolic Panel: Recent Labs  Lab 12/28/23 0450 12/29/23 0820 12/30/23 0821  NA 132* 130* 128*  K 4.0 4.6 4.6  CL 94* 92* 90*  CO2 23 23 22   GLUCOSE 160* 168* 157*  BUN 15 25* 35*  CREATININE 4.13* 5.68* 7.05*  CALCIUM  9.1 9.5 9.4    CBC: Recent Labs  Lab 12/26/23 0445 12/27/23 0513 12/30/23 0000  WBC 7.1 8.3 8.5  HGB 9.4* 9.2* 8.7*  HCT 28.9* 28.7* 27.8*  MCV 99.3 99.3 99.6  PLT 269 329 283    Medications:  ceFEPime  (MAXIPIME ) IV     [START ON 01/02/2024] ceFEPime  (MAXIPIME ) IV     vancomycin      [START ON 01/02/2024] vancomycin       acetaminophen   1,000 mg Oral TID   artificial tears   Right Eye QID   bictegravir-emtricitabine -tenofovir  AF  1 tablet Oral Daily   Chlorhexidine  Gluconate Cloth  6 each Topical Q0600   [START ON 01/02/2024] darbepoetin (ARANESP ) injection - DIALYSIS  100 mcg Subcutaneous Q Fri-1800   fentaNYL   1 patch Transdermal Q72H   gabapentin   100 mg Oral TID   gatifloxacin   1 drop  Right Eye BID   heparin   5,000 Units Subcutaneous Q8H   ibuprofen   600 mg Oral TID   insulin  aspart  0-5 Units Subcutaneous QHS   insulin  aspart  0-6 Units Subcutaneous TID WC   lidocaine   2 patch Transdermal Q24H   methocarbamol   500 mg Oral TID   midodrine   10 mg Oral TID WC   pantoprazole   40 mg Oral Q1200   polyethylene glycol  17 g Oral Daily   sevelamer  carbonate  800 mg Oral TID WC   sodium chloride  flush  10-40 mL Intracatheter Q12H    Dialysis Orders: [facility]-MWF 3.5h  B400  45.5kg  TDC  Heparin  2000 Mircera 100 mcg - last dose 12/01/23 Calcitriol  1.0 mcg (on hold due to high Ca)   Home bp meds: Midodrine  10mg  tid prn for SBP < 100 Midodrine  10mg  pre HD mwf prn   Assessment/Plan: 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 Not a surgical candidate. Blood cxs ng. Plan for IV Cefepime  2g and IV Vancomycin  500mg  with HD X 8 weeks end date 02/09/24. Pain management per primary team.   ESRD: on HD  MWF. Next HD 01/01/24.  BP: chronic low BP on midodrine  10 tid prn and 10mg  pre HD mwf as needed for SBP < 100. Can give albumin  during HD for low BP.  Volume:  UF as tolerated. Has lost weight. Now under EDW. Will need EDW changed at d/c.  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  No IV iron  due to infection.  HIV: controlled on Biktarvy  BMD: Corrected Ca 10.3 and phos 4.9. Continue sevelamer  TID WC. VDRA on hold due to high Ca. Nutrition - renal diet w/fluid restrictions. Debility: PT has requested that we schedule him for 2nd shift whenever we can.    Belvie Och, NP [facility] 01/01/2024,12:16 PM

## 2024-01-01 NOTE — Plan of Care (Signed)

## 2024-01-02 LAB — CBC
HCT: 25.8 % — ABNORMAL LOW (ref 39.0–52.0)
Hemoglobin: 8.4 g/dL — ABNORMAL LOW (ref 13.0–17.0)
MCH: 32.3 pg (ref 26.0–34.0)
MCHC: 32.6 g/dL (ref 30.0–36.0)
MCV: 99.2 fL (ref 80.0–100.0)
Platelets: 234 K/uL (ref 150–400)
RBC: 2.6 MIL/uL — ABNORMAL LOW (ref 4.22–5.81)
RDW: 15.7 % — ABNORMAL HIGH (ref 11.5–15.5)
WBC: 5.8 K/uL (ref 4.0–10.5)
nRBC: 0 % (ref 0.0–0.2)

## 2024-01-02 LAB — RENAL FUNCTION PANEL
Albumin: 2.6 g/dL — ABNORMAL LOW (ref 3.5–5.0)
Anion gap: 14 (ref 5–15)
BUN: 17 mg/dL (ref 6–20)
CO2: 24 mmol/L (ref 22–32)
Calcium: 9 mg/dL (ref 8.9–10.3)
Chloride: 92 mmol/L — ABNORMAL LOW (ref 98–111)
Creatinine, Ser: 3.89 mg/dL — ABNORMAL HIGH (ref 0.61–1.24)
GFR, Estimated: 18 mL/min — ABNORMAL LOW (ref >60)
Glucose, Bld: 173 mg/dL — ABNORMAL HIGH (ref 70–99)
Phosphorus: 3.5 mg/dL (ref 2.5–4.6)
Potassium: 3.5 mmol/L (ref 3.5–5.1)
Sodium: 130 mmol/L — ABNORMAL LOW (ref 135–145)

## 2024-01-02 LAB — GLUCOSE, CAPILLARY
Glucose-Capillary: 137 mg/dL — ABNORMAL HIGH (ref 70–99)
Glucose-Capillary: 159 mg/dL — ABNORMAL HIGH (ref 70–99)
Glucose-Capillary: 215 mg/dL — ABNORMAL HIGH (ref 70–99)
Glucose-Capillary: 111 mg/dL — ABNORMAL HIGH (ref 70–99)
Glucose-Capillary: 54 mg/dL — ABNORMAL LOW (ref 70–99)

## 2024-01-02 MED ORDER — SODIUM CHLORIDE 0.9 % IV SOLN
2.0000 g | Freq: Once | INTRAVENOUS | Status: AC
  Filled 2024-01-02 (×2): qty 12.5

## 2024-01-02 MED ORDER — VANCOMYCIN HCL 500 MG/100ML IV SOLN
500.0000 mg | Freq: Once | INTRAVENOUS | Status: AC
  Filled 2024-01-02: qty 100

## 2024-01-02 NOTE — TOC Progression Note (Signed)
 Transition of Care [facility]) - Progression Note    Patient Details  Name: Alvin Daniels MRN: [id number] Date of Birth: November 28, 1970  Transition of Care [facility]) CM/SW Contact  Inocente GORMAN Kindle, LCSW Phone Number: 01/02/2024, 10:43 AM  Clinical Narrative:    ICM continuing to follow. Discharge barriers remain in place.     Expected Discharge Plan: Home/Self Care Barriers to Discharge: Continued Medical Work up, Inadequate or no insurance, Waiting for outpatient dialysis               Expected Discharge Plan and Services In-house Referral: Clinical Social Work     Living arrangements for the past 2 months: Single Family Home                                       Social Drivers of Health (SDOH) Interventions SDOH Screenings   Food Insecurity: No Food Insecurity (12/15/2023)  Housing: Low Risk  (12/15/2023)  Transportation Needs: No Transportation Needs (12/15/2023)  Utilities: Not At Risk (12/15/2023)  Alcohol  Screen: Low Risk  (07/17/2023)  Depression (PHQ2-9): Low Risk  (07/17/2023)  Financial Resource Strain: Low Risk  (01/29/2023)  Physical Activity: Inactive (01/29/2023)  Social Connections: Moderately Isolated (10/08/2023)  Stress: No Stress Concern Present (01/29/2023)  Tobacco Use: Low Risk  (12/14/2023)  Health Literacy: Adequate Health Literacy (01/29/2023)    Readmission Risk Interventions     No data to display

## 2024-01-02 NOTE — TOC Progression Note (Signed)
 Transition of Care [facility]) - Progression Note    Patient Details  Name: Alvin Daniels MRN: [id number] Date of Birth: 09-May-1971  Transition of Care [facility]) CM/SW Contact  Inocente GORMAN Kindle, LCSW Phone Number: 01/02/2024, 10:42 AM  Clinical Narrative:    Patient transferred to 5W. Multiple barriers remain in place for discharge. Continuing to follow.    Expected Discharge Plan: Home/Self Care Barriers to Discharge: Continued Medical Work up, Inadequate or no insurance, Waiting for outpatient dialysis               Expected Discharge Plan and Services In-house Referral: Clinical Social Work     Living arrangements for the past 2 months: Single Family Home                                       Social Drivers of Health (SDOH) Interventions SDOH Screenings   Food Insecurity: No Food Insecurity (12/15/2023)  Housing: Low Risk  (12/15/2023)  Transportation Needs: No Transportation Needs (12/15/2023)  Utilities: Not At Risk (12/15/2023)  Alcohol  Screen: Low Risk  (07/17/2023)  Depression (PHQ2-9): Low Risk  (07/17/2023)  Financial Resource Strain: Low Risk  (01/29/2023)  Physical Activity: Inactive (01/29/2023)  Social Connections: Moderately Isolated (10/08/2023)  Stress: No Stress Concern Present (01/29/2023)  Tobacco Use: Low Risk  (12/14/2023)  Health Literacy: Adequate Health Literacy (01/29/2023)    Readmission Risk Interventions     No data to display

## 2024-01-02 NOTE — Progress Notes (Signed)
 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  PMH includes ESRD on HD MWF, hepC, MRSA, HIV, bilat BKA, DMII.   OT comments  Pt reported good control of pain initially during OT session, he really wanted to attempt Oob mobility deferred UE HEP for now. Pt was able to rise into standing with CGA + RW but became limited by pain in his L hip, He needed min A to take one hop forward/back. He was comfortable donning both of his prosthetics independently and pt stated that pain is much better than before now that he is able to don L prosthesis when comparing past sessions. OT to continue efforts to progress pt as able.       If plan is discharge home, recommend the following:  Assistance with cooking/housework;A little help with bathing/dressing/bathroom   Equipment Recommendations  None recommended by OT (Pt declined additional DME)    Recommendations for Other Services      Precautions / Restrictions Precautions Precautions: Fall Recall of Precautions/Restrictions: Impaired Precaution/Restrictions Comments: B BKA; prosthetics in the room Restrictions Weight Bearing Restrictions Per Provider Order: No       Mobility Bed Mobility Overal bed mobility: Needs Assistance Bed Mobility: Supine to Sit           General bed mobility comments: Pt supervision to get to EOB    Transfers Overall transfer level: Needs assistance Equipment used: Rolling walker (2 wheels) Transfers: Sit to/from Stand Sit to Stand: Contact guard assist           General transfer comment: CGA STSx2 from EOB, increased time needed to rise. Pt reported too  much pain with L prosthesis on attempting gait, used only R prosthesis during second trial but pt a good min A to assist with balance while performing 1 small hop forward and back. Was not able to fully ambulate secondary to pain.     Balance Overall balance assessment: Needs assistance Sitting-balance support: Feet unsupported, Bilateral upper extremity supported Sitting balance-Leahy Scale: Good     Standing balance support: Bilateral upper extremity supported Standing balance-Leahy Scale: Poor Standing balance comment: heavy reliance on rolling walker for support                           ADL either performed or assessed with clinical judgement   ADL                                         General ADL Comments: Pt reported being open to standing today and initially reported no pain. attempted gait today    Extremity/Trunk Assessment              Vision       Perception     Praxis     Communication Communication Communication: No apparent difficulties;Other (comment) Factors Affecting Communication: Non - English speaking, interpreter not available   Cognition Arousal: Alert Behavior During Therapy: WFL for tasks assessed/performed Cognition: No apparent impairments  Following commands: Intact        Cueing   Cueing Techniques: Verbal cues, Visual cues  Exercises      Shoulder Instructions       General Comments      Pertinent Vitals/ Pain       Pain Assessment Pain Assessment: Faces Faces Pain Scale: Hurts even more Pain Location: L hip Pain Descriptors / Indicators: Discomfort, Sore Pain Intervention(s): Monitored during session, Limited activity within patient's tolerance, Repositioned  Home Living                                          Prior Functioning/Environment              Frequency  Min 2X/week        Progress Toward Goals  OT  Goals(current goals can now be found in the care plan section)  Progress towards OT goals: Progressing toward goals  Acute Rehab OT Goals Patient Stated Goal: To have pain go down then return home OT Goal Formulation: With patient Time For Goal Achievement: 01/07/24 Potential to Achieve Goals: Good  Plan      Co-evaluation                 AM-PAC OT 6 Clicks Daily Activity     Outcome Measure   Help from another person eating meals?: A Little Help from another person taking care of personal grooming?: A Little Help from another person toileting, which includes using toliet, bedpan, or urinal?: A Little Help from another person bathing (including washing, rinsing, drying)?: A Little Help from another person to put on and taking off regular upper body clothing?: A Little Help from another person to put on and taking off regular lower body clothing?: A Little 6 Click Score: 18    End of Session Equipment Utilized During Treatment: Rolling walker (2 wheels);Gait belt  OT Visit Diagnosis: Pain;Other abnormalities of gait and mobility (R26.89);Unsteadiness on feet (R26.81) Pain - Right/Left: Left Pain - part of body: Hip   Activity Tolerance Patient tolerated treatment well   Patient Left in bed;with call bell/phone within reach;with bed alarm set   Nurse Communication Mobility status        Time: 8791-8763 OT Time Calculation (min): 28 min  Charges: OT General Charges $OT Visit: 1 Visit OT Treatments $Therapeutic Activity: 23-37 mins  01/02/2024  AB, OTR/L  Acute Rehabilitation Services  Office: [phone]   [name] 01/02/2024, 5:10 PM

## 2024-01-02 NOTE — Progress Notes (Signed)
 [facility] Progress Note   Subjective:  Seen and examined in his room this morning.He reports better pain control today. He denies any dyspnea or CP today. Last HD we got 1.5L during UF.  Plan for HD 01/03/24  Objective Vitals:   01/02/24 0355 01/02/24 0401 01/02/24 0500 01/02/24 0806  BP:  122/77    Pulse:      Resp:  10 14   Temp: 98.5 F (36.9 C)  97.9 F (36.6 C) 99.6 F (37.6 C)  TempSrc: Oral  Oral Oral  SpO2:   95% 92%  Weight:      Height:       Physical Exam General: Alert, sitting up in bed, nad Heart: RRR  Lungs: nml WOB on RA Abdomen: soft, ND Extremities: b/l BKA, no edema Dialysis Access: [facility]   Filed Weights   12/27/23 0846 12/27/23 1215 12/30/23 0803  Weight: (S) 41.6 kg (S) 41 kg 42.1 kg    Intake/Output Summary (Last 24 hours) at 01/02/2024 1040 Last data filed at 01/01/2024 2006 Gross per 24 hour  Intake 240 ml  Output 1500 ml  Net -1260 ml     Additional Objective Labs: Basic Metabolic Panel: Recent Labs  Lab 12/29/23 0820 12/30/23 0821 01/02/24 0530  NA 130* 128* 130*  K 4.6 4.6 3.5  CL 92* 90* 92*  CO2 23 22 24   GLUCOSE 168* 157* 173*  BUN 25* 35* 17  CREATININE 5.68* 7.05* 3.89*  CALCIUM  9.5 9.4 9.0  PHOS  --   --  3.5    CBC: Recent Labs  Lab 12/27/23 0513 12/30/23 0000 01/02/24 0530  WBC 8.3 8.5 5.8  HGB 9.2* 8.7* 8.4*  HCT 28.7* 27.8* 25.8*  MCV 99.3 99.6 99.2  PLT 329 283 234    Medications:  ceFEPime  (MAXIPIME ) IV     vancomycin       acetaminophen   1,000 mg Oral TID   artificial tears   Right Eye QID   bictegravir-emtricitabine -tenofovir  AF  1 tablet Oral Daily   Chlorhexidine  Gluconate Cloth  6 each Topical Q0600   darbepoetin (ARANESP ) injection - DIALYSIS  100 mcg Subcutaneous Q Fri-1800   fentaNYL   1 patch Transdermal Q72H   gabapentin   100 mg Oral TID   gatifloxacin   1 drop Right Eye BID   heparin   5,000 Units Subcutaneous Q8H   ibuprofen   600 mg Oral TID WC   insulin  aspart  0-5  Units Subcutaneous QHS   insulin  aspart  0-6 Units Subcutaneous TID WC   lidocaine   2 patch Transdermal Q24H   methocarbamol   500 mg Oral TID   midodrine   10 mg Oral TID WC   pantoprazole   40 mg Oral Q1200   polyethylene glycol  17 g Oral Daily   sevelamer  carbonate  800 mg Oral TID WC   sodium chloride  flush  10-40 mL Intracatheter Q12H    Dialysis Orders: [facility] 3.5h  B400  45.5kg  TDC  Heparin  2000 Mircera 100 mcg - last dose 12/01/23 Calcitriol  1.0 mcg (on hold due to high Ca)   Home bp meds: Midodrine  10mg  tid prn for SBP < 100 Midodrine  10mg  pre HD mwf prn   Assessment/Plan: 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 Not a surgical candidate. Blood cxs ng. Plan for IV Cefepime  2g and IV Vancomycin  500mg  with HD X 8 weeks end date 02/09/24. Pain management per primary team.   ESRD: on HD MWF. Next HD 01/02/24.  BP: chronic low BP  on midodrine  10 tid prn and 10mg  pre HD mwf as needed for SBP < 100. Can give albumin  during HD for low BP.  Volume:  UF as tolerated. Has lost weight. Now under EDW. Will need EDW changed at d/c.  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  No IV iron  due to infection.  HIV: controlled on Biktarvy  BMD: Corrected Ca 10.1 and phos 4.9. Continue sevelamer  TID WC. VDRA on hold. Continue to trend Ca Nutrition - renal diet w/fluid restrictions. Debility: PT has requested that we schedule him for 2nd shift whenever we can.    Belvie Och, NP [facility] 01/02/2024,10:40 AM

## 2024-01-02 NOTE — Plan of Care (Signed)

## 2024-01-02 NOTE — Progress Notes (Signed)
 PROGRESS NOTE        PATIENT DETAILS Name: Alvin Daniels Age: 53 y.o. Sex: male Date of Birth: 1970-07-30 Admit Date: 12/14/2023 Admitting Physician Marsha Ada, MD PCP:Pcp, No  Brief Summary: Patient is a 53 y.o.  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  Significant events: 8/4>> admit to [facility]  Significant studies: 6/4>> TEE: EF 60-65%, 1.8 x 1.2 mass right atrial wall-?vegetation from old HD catheter impinged on RA wall-versus mass/tumor. 8/4>> MRI pelvis: Discitis/osteomyelitis L4-5-right psoas abscess. 8/5>> CT abdomen/pelvis: Discitis/osteomyelitis L4-5 with paraspinal inflammatory changes/right psoas abscess.  8/8>> L4-5 discitis/osteomyelitis with associated paraspinal inflammatory changes, right psoas abscess. 8/20>> MRI left hip: Discitis of the lower lumbar spine with extension into the psoas muscle. 8/20>> BKA-no significant abnormality.  Significant microbiology data: 5/28>> blood culture: MRSA 5/29>> left finger abscess: E. coli/Streptococcus anginosus, Bacteroides fragilis. 8/4>> blood culture: No growth  Procedures: None.  Consults: ID Nephrology Ophthalmology  Subjective: Pain issues unchanged.  Apparently was refusing Tylenol -I have counseled him to take all his medications as prescribed.  Objective: Vitals: Blood pressure 112/81, pulse 91, temperature 98.5 F (36.9 C), temperature source Oral, resp. rate 14, height 4' 11 (1.499 m), weight 42.1 kg, SpO2 92%.   Exam: Awake/alert-frail/chronically sick. Chest: Clear to auscultation CVS: S1-S2 regular B/L BKA Nonfocal.    Pertinent Labs/Radiology:    Latest Ref Rng & Units 01/02/2024    5:30 AM  12/30/2023   12:00 AM 12/27/2023    5:13 AM  CBC  WBC 4.0 - 10.5 K/uL 5.8  8.5  8.3   Hemoglobin 13.0 - 17.0 g/dL 8.4  8.7  9.2   Hematocrit 39.0 - 52.0 % 25.8  27.8  28.7   Platelets 150 - 400 K/uL 234  283  329     Lab Results  Component Value Date   NA 130 (L) 01/02/2024   K 3.5 01/02/2024   CL 92 (L) 01/02/2024   CO2 24 01/02/2024      Assessment/Plan: L4-L5 discitis with surrounding paraspinal inflammatory change and right psoas abscess Recent history of MRSA bacteremia/left fourth finger osteomyelitis May 2025 (strep/Bacteroides/E. coli on bone culture) Likely MRSA-although cultures this admission negative.   Evaluated by ID-recommendations are for vancomycin /cefepime  x 8 weeks-EOT 9/29 Reviewed prior notes-not a surgical candidate-IR unable to drain due to lack of safe window.  Severe low back pain-left hip pain Sciatica physiology due to L4-L5 osteomyelitis-with paraspinal inflammation/right psoas abscess Pain essentially unchanged Blood pressure soft-limiting further increase in narcotics Continue transdermal fentanyl /as needed oxycodone -scheduled Tylenol  and scheduled NSAIDs Continue Lidoderm  patches Supportive care  Syncope on 8/20 Probably related to soft blood pressure/orthostatic hypotension Telemetry monitoring unremarkable.  Chronic hypotension On midodrine  Narcotics for back pain very not helping. Minimize narcotics as much as possible-avoid further escalation in narcotic dosing.  ESRD on HD MWF Nephrology following.  Normocytic anemia Secondary to ESRD Hb stable Aranesp /iron  defer to nephrology service Follow  PAD-s/p bilateral BKA Normally uses prosthesis Due to severe pain-he has been unable to ambulate with a prosthesis.  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  Difficult disposition No family-uninsured-history of ESRD with bilateral BKA-currently unable to ambulate with his prostheses-hence unable to be discharged home safely.  Underweight: Estimated body mass index is 18.75 kg/m as calculated from the following:   Height as of this encounter: 4' 11 (1.499 m).   Weight as of this encounter: 42.1 kg.   Code status:   Code Status: Full Code   DVT Prophylaxis: heparin  injection 5,000 Units Start: 12/15/23 1400   Family Communication: None at bedside   Disposition Plan: Status is: Inpatient Remains inpatient appropriate because: Severe of illness   Planned Discharge Destination:Home health   Diet: Diet Order             Diet renal/carb modified with fluid restriction Diet-HS Snack? Nothing; Fluid restriction: 1200 mL Fluid; Room service appropriate? Yes; Fluid consistency: Thin  Diet effective now                     Antimicrobial agents: Anti-infectives (From admission, onward)    Start     Dose/Rate Route Frequency Ordered Stop   01/03/24 1200  vancomycin  (VANCOREADY) IVPB 500 mg/100 mL        500 mg 100 mL/hr over 60 Minutes Intravenous  Once 01/02/24 1052     01/02/24 1200  ceFEPIme  (MAXIPIME ) 2 g in sodium chloride  0.9 % 100 mL IVPB        2 g 200 mL/hr over 30 Minutes Intravenous Every M-W-F (Hemodialysis) 12/31/23 1221     01/02/24 1200  vancomycin  (VANCOREADY) IVPB 500 mg/100 mL        500 mg 100 mL/hr over 60 Minutes Intravenous Every M-W-F (Hemodialysis) 12/31/23 1221     01/01/24 1200  vancomycin  (VANCOREADY) IVPB 500 mg/100 mL        500 mg 100 mL/hr over 60 Minutes Intravenous Every T-Th-Sa (Hemodialysis) 12/31/23  1219 01/01/24 1609   01/01/24 1200  ceFEPIme  (MAXIPIME ) 2 g in sodium chloride  0.9 % 100 mL IVPB        2 g 200 mL/hr over 30 Minutes Intravenous Once in dialysis 12/31/23 1219 01/01/24 1840   12/30/23 1400  vancomycin  (VANCOREADY) IVPB 500 mg/100 mL        500 mg 100 mL/hr over 60 Minutes Intravenous  Once 12/30/23 1310 12/30/23 2033   12/30/23 1400  ceFEPIme  (MAXIPIME ) 1 g in sodium chloride  0.9 % 100 mL IVPB        1 g 200 mL/hr over 30 Minutes Intravenous  Once 12/30/23 1310 12/30/23 2005   12/29/23 2230  ceFEPIme  (MAXIPIME ) 1 g in sodium chloride  0.9 % 100 mL IVPB        1 g 200 mL/hr over 30 Minutes Intravenous  Once 12/29/23 2131 12/30/23 0656   12/29/23 1200  vancomycin  (VANCOREADY) IVPB 500 mg/100 mL  Status:  Discontinued        500 mg 100 mL/hr over 60 Minutes Intravenous Every M-W-F (Hemodialysis) 12/25/23 1313 12/26/23 1056   12/29/23 1200  ceFEPIme  (MAXIPIME ) 2 g in sodium chloride  0.9 % 100 mL IVPB  Status:  Discontinued        2 g 200 mL/hr over 30 Minutes  Intravenous Every M-W-F (Hemodialysis) 12/26/23 1056 12/31/23 1221   12/29/23 1200  vancomycin  (VANCOREADY) IVPB 500 mg/100 mL  Status:  Discontinued        500 mg 100 mL/hr over 60 Minutes Intravenous Every M-W-F (Hemodialysis) 12/26/23 1056 12/31/23 1221   12/27/23 1400  ceFEPIme  (MAXIPIME ) 2 g in sodium chloride  0.9 % 100 mL IVPB        2 g 200 mL/hr over 30 Minutes Intravenous  Once 12/27/23 1319 12/27/23 1443   12/27/23 1200  vancomycin  (VANCOREADY) IVPB 500 mg/100 mL  Status:  Discontinued        500 mg 100 mL/hr over 60 Minutes Intravenous Every Sat (Hemodialysis) 12/25/23 1313 12/26/23 1056   12/27/23 1200  ceFEPIme  (MAXIPIME ) 2 g in sodium chloride  0.9 % 100 mL IVPB  Status:  Discontinued        2 g 200 mL/hr over 30 Minutes Intravenous Every Sat (Hemodialysis) 12/26/23 1056 12/27/23 1318   12/27/23 1200  vancomycin  (VANCOREADY) IVPB 500 mg/100 mL        500 mg 100 mL/hr over 60 Minutes Intravenous Every  Sat (Hemodialysis) 12/26/23 1056 12/27/23 1227   12/26/23 1700  ciprofloxacin  (CIPRO ) tablet 500 mg        500 mg Oral  Once 12/26/23 1056 12/26/23 1704   12/25/23 1915  ciprofloxacin  (CIPRO ) tablet 500 mg        500 mg Oral  Once 12/25/23 1819 12/25/23 1849   12/25/23 1800  ceFEPIme  (MAXIPIME ) 1 g in sodium chloride  0.9 % 100 mL IVPB  Status:  Discontinued        1 g 200 mL/hr over 30 Minutes Intravenous Every 24 hours 12/25/23 1316 12/26/23 1056   12/25/23 1200  ceFEPIme  (MAXIPIME ) 2 g in sodium chloride  0.9 % 100 mL IVPB  Status:  Discontinued        2 g 200 mL/hr over 30 Minutes Intravenous Once in dialysis 12/24/23 1739 12/25/23 1316   12/25/23 1200  vancomycin  (VANCOCIN ) IVPB 1000 mg/200 mL premix  Status:  Discontinued        1,000 mg 200 mL/hr over 60 Minutes Intravenous  Once 12/24/23 1739 12/24/23 1809   12/25/23 1200  vancomycin  (VANCOREADY) IVPB 500 mg/100 mL  Status:  Discontinued        500 mg 100 mL/hr over 60 Minutes Intravenous  Once 12/24/23 1809 12/25/23 1313   12/24/23 1200  ceFEPIme  (MAXIPIME ) 2 g in sodium chloride  0.9 % 100 mL IVPB  Status:  Discontinued        2 g 200 mL/hr over 30 Minutes Intravenous Every M-W-F (Hemodialysis) 12/22/23 0851 12/25/23 1316   12/19/23 0000  ceFEPime  (MAXIPIME ) IVPB        2 g Intravenous Every M-W-F (Hemodialysis) 12/19/23 1300 02/09/24 2359   12/19/23 0000  vancomycin  IVPB        500 mg Intravenous Every M-W-F (Hemodialysis) 12/19/23 1300 02/09/24 2359   12/16/23 2200  ceFEPIme  (MAXIPIME ) 1 g in sodium chloride  0.9 % 100 mL IVPB        1 g 200 mL/hr over 30 Minutes Intravenous Every 24 hours 12/15/23 1348 12/22/23 2203   12/15/23 2230  vancomycin  (VANCOREADY) IVPB 500 mg/100 mL  Status:  Discontinued        500 mg 100 mL/hr over 60 Minutes Intravenous Every M-W-F (Hemodialysis) 12/15/23 2217 12/25/23 1313   12/15/23 1400  bictegravir-emtricitabine -tenofovir  AF (BIKTARVY ) 50-200-25 MG per tablet 1 tablet        1 tablet Oral  Daily  12/15/23 1307     12/15/23 1347  vancomycin  variable dose per unstable renal function (pharmacist dosing)  Status:  Discontinued         Does not apply See admin instructions 12/15/23 1348 12/15/23 2218   12/15/23 1100  ceFEPIme  (MAXIPIME ) 2 g in sodium chloride  0.9 % 100 mL IVPB        2 g 200 mL/hr over 30 Minutes Intravenous  Once 12/15/23 1048 12/15/23 1130   12/15/23 1100  vancomycin  (VANCOCIN ) IVPB 1000 mg/200 mL premix        1,000 mg 200 mL/hr over 60 Minutes Intravenous  Once 12/15/23 1048 12/15/23 1154        MEDICATIONS: Scheduled Meds:  acetaminophen   1,000 mg Oral TID   artificial tears   Right Eye QID   bictegravir-emtricitabine -tenofovir  AF  1 tablet Oral Daily   Chlorhexidine  Gluconate Cloth  6 each Topical Q0600   darbepoetin (ARANESP ) injection - DIALYSIS  100 mcg Subcutaneous Q Fri-1800   fentaNYL   1 patch Transdermal Q72H   gabapentin   100 mg Oral TID   gatifloxacin   1 drop Right Eye BID   heparin   5,000 Units Subcutaneous Q8H   ibuprofen   600 mg Oral TID WC   insulin  aspart  0-5 Units Subcutaneous QHS   insulin  aspart  0-6 Units Subcutaneous TID WC   lidocaine   2 patch Transdermal Q24H   methocarbamol   500 mg Oral TID   midodrine   10 mg Oral TID WC   pantoprazole   40 mg Oral Q1200   polyethylene glycol  17 g Oral Daily   sevelamer  carbonate  800 mg Oral TID WC   sodium chloride  flush  10-40 mL Intracatheter Q12H   Continuous Infusions:  ceFEPime  (MAXIPIME ) IV     vancomycin      [START ON 01/03/2024] vancomycin      PRN Meds:.glucagon  (human recombinant), hydrALAZINE , ipratropium-albuterol , metoprolol  tartrate, ondansetron  (ZOFRAN ) IV, oxyCODONE , sodium chloride  flush   I have personally reviewed following labs and imaging studies  LABORATORY DATA: CBC: Recent Labs  Lab 12/27/23 0513 12/30/23 0000 01/02/24 0530  WBC 8.3 8.5 5.8  HGB 9.2* 8.7* 8.4*  HCT 28.7* 27.8* 25.8*  MCV 99.3 99.6 99.2  PLT 329 283 234    Basic Metabolic Panel: Recent  Labs  Lab 12/27/23 0513 12/28/23 0450 12/29/23 0820 12/30/23 0821 01/02/24 0530  NA 130* 132* 130* 128* 130*  K 4.7 4.0 4.6 4.6 3.5  CL 92* 94* 92* 90* 92*  CO2 23 23 23 22 24   GLUCOSE 144* 160* 168* 157* 173*  BUN 26* 15 25* 35* 17  CREATININE 6.30* 4.13* 5.68* 7.05* 3.89*  CALCIUM  9.4 9.1 9.5 9.4 9.0  MG 2.2  --   --  2.4  --   PHOS  --   --   --   --  3.5    GFR: Estimated Creatinine Clearance: 13.1 mL/min (A) (by C-G formula based on SCr of 3.89 mg/dL (H)).  Liver Function Tests: Recent Labs  Lab 01/02/24 0530  ALBUMIN  2.6*   No results for input(s): LIPASE, AMYLASE in the last 168 hours. No results for input(s): AMMONIA in the last 168 hours.  Coagulation Profile: No results for input(s): INR, PROTIME in the last 168 hours.  Cardiac Enzymes: No results for input(s): CKTOTAL, CKMB, CKMBINDEX, TROPONINI in the last 168 hours.  BNP (last 3 results) No results for input(s): PROBNP in the last 8760 hours.  Lipid Profile: No results for input(s): CHOL, HDL, LDLCALC,  TRIG, CHOLHDL, LDLDIRECT in the last 72 hours.  Thyroid Function Tests: No results for input(s): TSH, T4TOTAL, FREET4, T3FREE, THYROIDAB in the last 72 hours.  Anemia Panel: No results for input(s): VITAMINB12, FOLATE, FERRITIN, TIBC, IRON , RETICCTPCT in the last 72 hours.  Urine analysis:    Component Value Date/Time   COLORURINE YELLOW 07/04/2010 1840   APPEARANCEUR TURBID (A) 07/04/2010 1840   LABSPEC 1.013 07/04/2010 1840   PHURINE 7.0 07/04/2010 1840   GLUCOSEU NEG mg/dL 97/77/7987 8159   HGBUR TRACE (A) 03/13/2010 1824   BILIRUBINUR NEG 07/04/2010 1840   KETONESUR NEG mg/dL 97/77/7987 8159   PROTEINUR > 300 mg/dL (A) 97/77/7987 8159   UROBILINOGEN 0.2 07/04/2010 1840   NITRITE NEG 07/04/2010 1840   LEUKOCYTESUR LARGE (A) 07/04/2010 1840    Sepsis Labs: Lactic Acid, Venous    Component Value Date/Time   LATICACIDVEN 1.0  12/15/2023 0741    MICROBIOLOGY: No results found for this or any previous visit (from the past 240 hours).  RADIOLOGY STUDIES/RESULTS: MR KNEE LEFT WO CONTRAST Result Date: 12/31/2023 MR KNEE WITHOUT IV CONTRAST COMPARISON: None. CLINICAL HISTORY: Below-the-knee amputation. Sepsis. PULSE SEQUENCES: Ax PD FS, Sag T2 ACL, Sag PD FS, Cor PD FS & COR T1 FINDINGS: Below-the-knee amputation changes are present. There is an intramedullary rod in the distal femur. There is a healed distal femur fracture. No significant joint effusion is present. The ACL is not well seen. The PCL is intact. MCL and fibular collateral ligaments are unremarkable. Menisci are unremarkable. IMPRESSION: BKA without significant abnormality. Postsurgical changes are seen in distal femur with a healed distal femur fracture. Mild atrophy and myositis is present without drainable collection. Electronically signed by: Norleen Satchel MD 12/31/2023 07:52 PM EDT RP Workstation: [id number]   MR HIP LEFT WO CONTRAST Result Date: 12/31/2023 MR HIP WITHOUT IV CONTRAST LEFT COMPARISON: MRI lumbar spine 12/19/2023 CLINICAL HISTORY: History of bacteremia. Discitis/osteomyelitis. PULSE SEQUENCES: AX T1, Ax T2 FS, Cor T1, COR STIR & SMALL FOV COR PD FS with contrast. FINDINGS: There is mild degenerative arthrosis of the left hip with slight joint space loss and small joint effusion. The reconstructed process. Intramedullary rod is seen in the left femur. There is abnormal marrow signal in the pelvis and sacrum. This is likely related to anemia. There is fluid identified in the lower lumbar spine which appears to be at the L4-5 level with extension in the psoas muscles. There are likely bilateral psoas abscesses. Findings are consistent with discitis and osteomyelitis with associated psoas abscesses. Relatively stable to slightly progressed when compared with the recent MRI of the lumbar spine. Musculotendinous structures demonstrate mild myositis in  bilateral hips which is nonspecific. IMPRESSION: Discitis osteomyelitis of the lower lumbar spine with extension in psoas muscles consistent with psoas muscle abscesses. See MRI lumbar spine for more detail. Repeat L-spine MRI may be beneficial as this appears to have slightly progressed. There is likely reconversion red marrow in the sacrum and pelvis. Mild degenerative changes are seen in the hips without significant abnormality. Moderate myositis in the muscles which is nonspecific. Follow-up if symptoms progress. Electronically signed by: Norleen Satchel MD 12/31/2023 07:50 PM EDT RP Workstation: [id number]     LOS: 18 days   Donalda Applebaum, MD  [facility]    To contact the attending provider between 7A-7P or the covering provider during after hours 7P-7A, please log into the web site [email or web address] and access using universal [place] password for that web site. If you do not have  the password, please call the hospital operator.  01/02/2024, 11:53 AM

## 2024-01-03 LAB — GLUCOSE, CAPILLARY
Glucose-Capillary: 140 mg/dL — ABNORMAL HIGH (ref 70–99)
Glucose-Capillary: 114 mg/dL — ABNORMAL HIGH (ref 70–99)
Glucose-Capillary: 159 mg/dL — ABNORMAL HIGH (ref 70–99)
Glucose-Capillary: 176 mg/dL — ABNORMAL HIGH (ref 70–99)

## 2024-01-03 MED ORDER — HEPARIN SODIUM (PORCINE) 1000 UNIT/ML DIALYSIS
2000.0000 [IU] | INTRAMUSCULAR | Status: DC | PRN
  Administered 2024-01-03: 2000 [IU] via INTRAVENOUS_CENTRAL

## 2024-01-03 MED ORDER — ANTICOAGULANT SODIUM CITRATE 4% (200MG/5ML) IV SOLN: 5.0000 mL | Status: DC | PRN

## 2024-01-03 MED ORDER — VANCOMYCIN HCL 500 MG/100ML IV SOLN
INTRAVENOUS | Status: AC
Start: 2024-01-03 — End: 2024-01-03
  Filled 2024-01-03: qty 100

## 2024-01-03 MED ORDER — HEPARIN SODIUM (PORCINE) 1000 UNIT/ML DIALYSIS: 1000.0000 [IU] | INTRAMUSCULAR | Status: DC | PRN

## 2024-01-03 MED ORDER — HEPARIN SODIUM (PORCINE) 1000 UNIT/ML IJ SOLN
INTRAMUSCULAR | Status: AC
  Filled 2024-01-03: qty 4

## 2024-01-03 MED ORDER — ALTEPLASE 2 MG IJ SOLR: 2.0000 mg | Freq: Once | INTRAMUSCULAR | Status: DC | PRN

## 2024-01-03 MED ORDER — ALBUMIN HUMAN 25 % IV SOLN
25.0000 g | Freq: Once | INTRAVENOUS | Status: AC
  Administered 2024-01-03: 25 g via INTRAVENOUS

## 2024-01-03 MED ORDER — MIDODRINE HCL 5 MG PO TABS
ORAL_TABLET | ORAL | Status: AC
  Filled 2024-01-03: qty 1

## 2024-01-03 MED ORDER — HEPARIN SODIUM (PORCINE) 1000 UNIT/ML IJ SOLN
3800.0000 [IU] | Freq: Once | INTRAMUSCULAR | Status: AC
  Administered 2024-01-03: 3800 [IU]

## 2024-01-03 MED ORDER — LIDOCAINE-PRILOCAINE 2.5-2.5 % EX CREA: 1.0000 | TOPICAL_CREAM | CUTANEOUS | Status: DC | PRN

## 2024-01-03 MED ORDER — LIDOCAINE HCL (PF) 1 % IJ SOLN: 5.0000 mL | INTRAMUSCULAR | Status: DC | PRN

## 2024-01-03 MED ORDER — HEPARIN SODIUM (PORCINE) 1000 UNIT/ML IJ SOLN
INTRAMUSCULAR | Status: AC
  Filled 2024-01-03: qty 2

## 2024-01-03 MED ORDER — PENTAFLUOROPROP-TETRAFLUOROETH EX AERO: 1.0000 | INHALATION_SPRAY | CUTANEOUS | Status: DC | PRN

## 2024-01-03 NOTE — Progress Notes (Signed)
 [facility] Progress Note   Subjective:  Seen in [facility]. Just getting started with HD. No new events.   Objective Vitals:   01/03/24 0000 01/03/24 0400 01/03/24 0806 01/03/24 0855  BP: (!) 82/71 102/64 94/68 108/60  Pulse:  72 94 90  Resp: 14  19   Temp:  98.6 F (37 C) 97.9 F (36.6 C) 97.6 F (36.4 C)  TempSrc:  Oral Oral   SpO2:    100%  Weight:    40.3 kg  Height:       Physical Exam General: Alert, nad  Heart: RRR  Lungs: nml WOB on RA Abdomen: soft, ND Extremities: b/l BKA, no edema Dialysis Access: [facility]   Filed Weights   12/27/23 1215 12/30/23 0803 01/03/24 0855  Weight: (S) 41 kg 42.1 kg 40.3 kg   No intake or output data in the 24 hours ending 01/03/24 0900    Additional Objective Labs: Basic Metabolic Panel: Recent Labs  Lab 12/29/23 0820 12/30/23 0821 01/02/24 0530  NA 130* 128* 130*  K 4.6 4.6 3.5  CL 92* 90* 92*  CO2 23 22 24   GLUCOSE 168* 157* 173*  BUN 25* 35* 17  CREATININE 5.68* 7.05* 3.89*  CALCIUM  9.5 9.4 9.0  PHOS  --   --  3.5    CBC: Recent Labs  Lab 12/30/23 0000 01/02/24 0530  WBC 8.5 5.8  HGB 8.7* 8.4*  HCT 27.8* 25.8*  MCV 99.6 99.2  PLT 283 234    Medications:  ceFEPime  (MAXIPIME ) IV     ceFEPime  (MAXIPIME ) IV     vancomycin      vancomycin       acetaminophen   1,000 mg Oral TID   artificial tears   Right Eye QID   bictegravir-emtricitabine -tenofovir  AF  1 tablet Oral Daily   Chlorhexidine  Gluconate Cloth  6 each Topical Q0600   darbepoetin (ARANESP ) injection - DIALYSIS  100 mcg Subcutaneous Q Fri-1800   fentaNYL   1 patch Transdermal Q72H   gabapentin   100 mg Oral TID   gatifloxacin   1 drop Right Eye BID   heparin   5,000 Units Subcutaneous Q8H   ibuprofen   600 mg Oral TID WC   insulin  aspart  0-5 Units Subcutaneous QHS   insulin  aspart  0-6 Units Subcutaneous TID WC   lidocaine   2 patch Transdermal Q24H   methocarbamol   500 mg Oral TID   midodrine   10 mg Oral TID WC   pantoprazole   40 mg  Oral Q1200   polyethylene glycol  17 g Oral Daily   sevelamer  carbonate  800 mg Oral TID WC   sodium chloride  flush  10-40 mL Intracatheter Q12H    Dialysis Orders: [facility] 3.5h  B400  45.5kg  TDC  Heparin  2000 Mircera 100 mcg - last dose 12/01/23 Calcitriol  1.0 mcg (on hold due to high Ca)   Home bp meds: Midodrine  10mg  tid prn for SBP < 100 Midodrine  10mg  pre HD mwf prn   Assessment/Plan: 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 Not a surgical candidate. Blood cxs ng. Plan for IV Cefepime  2g and IV Vancomycin  500mg  with HD X 8 weeks end date 02/09/24. Pain management per primary team.   ESRD: on HD MWF.  HD off schedule today. Resume MWF schedule as able  BP: chronic low BP on midodrine  10 tid prn and 10mg  pre HD mwf as needed for SBP < 100. Can give albumin  during HD for low BP.  Volume:  UF as tolerated.  Has lost weight. Now under EDW. Will need EDW changed at d/c.  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  No IV iron  due to infection.  HIV: controlled on Biktarvy  BMD:  Continue sevelamer  TID WC. VDRA on hold. Continue to trend Ca Nutrition - renal diet w/fluid restrictions. Debility: PT has requested that we schedule him for 2nd shift whenever we can.   Maisie Ronnald Acosta PA-C [facility] 01/03/2024,9:00 AM

## 2024-01-03 NOTE — Progress Notes (Signed)
 Received patient in bed.Alert and oriented x 3.Consent verified.  Access used: Right hd catheter that worked well.Dressing on date.  Duration of treatment: 3.5 hours.  Uf goal 1.5 L  Medicines given: Albumin  25 g.                             Heparin  2500 units pre-run dose.                             Midodrine  5 mg.                             Vancomycin  500 mg= 100 ml.                             Maxipime  2g =100 ml.  Hemo comment: Not tolerating prescribed uf.  Hand off to the patient's nurse,back into his room with stable condition via transporter.

## 2024-01-03 NOTE — Progress Notes (Signed)
 PROGRESS NOTE        PATIENT DETAILS Name: Alvin Daniels Age: 53 y.o. Sex: male Date of Birth: December 31, 1970 Admit Date: 12/14/2023 Admitting Physician Marsha Ada, MD PCP:Pcp, No  Brief Summary: Patient is a 53 y.o.  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  Significant events: 8/4>> admit to [facility]  Significant studies: 6/4>> TEE: EF 60-65%, 1.8 x 1.2 mass right atrial wall-?vegetation from old HD catheter impinged on RA wall-versus mass/tumor. 8/4>> MRI pelvis: Discitis/osteomyelitis L4-5-right psoas abscess. 8/5>> CT abdomen/pelvis: Discitis/osteomyelitis L4-5 with paraspinal inflammatory changes/right psoas abscess.  8/8>> MRI LS spine: L4-5 discitis/osteomyelitis with associated paraspinal inflammatory changes, right psoas abscess. 8/20>> MRI left hip: Discitis of the lower lumbar spine with extension into the psoas muscle. 8/20>> BKA-no significant abnormality.  Significant microbiology data: 5/28>> blood culture: MRSA 5/29>> left finger abscess: E. coli/Streptococcus anginosus, Bacteroides fragilis. 8/4>> blood culture: No growth  Procedures: None.  Consults: ID Nephrology Ophthalmology  Subjective: No major issues overnight lying comfortably in bed.  Continues to have some issues with pain.  Claims he ambulated a bit more with PT/OT yesterday-compared to the past several days.  Objective: Vitals: Blood pressure 108/74, pulse 83, temperature 97.6 F (36.4 C), temperature source Oral, resp. rate 19, height 4' 11 (1.499 m), weight 40.3 kg, SpO2 100%.   Exam: Awake/alert Frail/chronically sick Chest: Clear to auscultation B/L BKA  Pertinent Labs/Radiology:    Latest  Ref Rng & Units 01/02/2024    5:30 AM 12/30/2023   12:00 AM 12/27/2023    5:13 AM  CBC  WBC 4.0 - 10.5 K/uL 5.8  8.5  8.3   Hemoglobin 13.0 - 17.0 g/dL 8.4  8.7  9.2   Hematocrit 39.0 - 52.0 % 25.8  27.8  28.7   Platelets 150 - 400 K/uL 234  283  329     Lab Results  Component Value Date   NA 130 (L) 01/02/2024   K 3.5 01/02/2024   CL 92 (L) 01/02/2024   CO2 24 01/02/2024      Assessment/Plan: L4-L5 discitis with surrounding paraspinal inflammatory change and right psoas abscess Recent history of MRSA bacteremia/left fourth finger osteomyelitis May 2025 (strep/Bacteroides/E. coli on bone culture) Likely MRSA-although cultures this admission negative.   Evaluated by ID-recommendations are for vancomycin /cefepime  x 8 weeks-EOT 9/29 Reviewed prior notes-not a surgical candidate-IR unable to drain due to lack of safe window.  Severe low back pain-left hip pain Sciatica physiology due to L4-L5 osteomyelitis-with paraspinal inflammation/right psoas abscess He appears comfortable-but continues to have some pain issues which are now much more stable with scheduled Tylenol /NSAIDs/transdermal fentanyl /Lidoderm  patches-and as needed oxycodone . Patient is well aware that blood pressure limits any further increase in narcotics at this point.  He is already on midodrine -and hemodialysis will be challenging. Repeat MRI sometime in next week  Syncope on 8/20 Probably related to soft blood pressure/orthostatic hypotension Telemetry monitoring unremarkable.  Chronic hypotension On midodrine  Narcotics for back pain very not helping. Minimize narcotics as much as possible-avoid further escalation in narcotic dosing.  ESRD on HD MWF Nephrology following.  Normocytic anemia Secondary to ESRD Hb stable Aranesp /iron  defer to nephrology service  Follow  PAD-s/p bilateral BKA Normally uses prosthesis Due to severe pain-he has been unable to ambulate with a prosthesis.  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  Difficult disposition No family-uninsured-SNF not a option per SW-history of ESRD with bilateral BKA-currently unable to ambulate with his prostheses-hence unable to be discharged home safely.   Normally-prior to this hospitalization was able to ambulate with prosthesis and catch the bus/scat transport to go back and forth from dialysis center.   Underweight: Estimated body mass index is 17.94 kg/m as calculated from the following:   Height as of this encounter: 4' 11 (1.499 m).   Weight as of this encounter: 40.3 kg.   Code status:   Code Status: Full Code   DVT Prophylaxis: heparin  injection 5,000 Units Start: 12/15/23 1400   Family Communication: None at bedside   Disposition Plan: Status is: Inpatient Remains inpatient appropriate because: Severe of illness   Planned Discharge Destination:Home health   Diet: Diet Order             Diet renal/carb modified with fluid restriction Diet-HS Snack? Nothing; Fluid restriction: 1200 mL Fluid; Room service appropriate? Yes; Fluid consistency: Thin  Diet effective now                     Antimicrobial agents: Anti-infectives (From admission, onward)    Start     Dose/Rate Route Frequency Ordered Stop   01/03/24 1200  vancomycin  (VANCOREADY) IVPB 500 mg/100 mL        500 mg 100 mL/hr over 60 Minutes Intravenous  Once 01/02/24 1052     01/03/24 1200  ceFEPIme  (MAXIPIME ) 2 g in sodium chloride  0.9 % 100 mL IVPB        2 g 200 mL/hr over  30 Minutes Intravenous  Once 01/02/24 1212     01/02/24 1200  ceFEPIme  (MAXIPIME ) 2 g in sodium chloride  0.9 % 100 mL IVPB        2 g 200 mL/hr over 30 Minutes Intravenous Every M-W-F (Hemodialysis) 12/31/23 1221     01/02/24 1200  vancomycin  (VANCOREADY) IVPB 500 mg/100 mL        500 mg 100 mL/hr over 60 Minutes Intravenous Every M-W-F (Hemodialysis) 12/31/23 1221     01/01/24 1200  vancomycin  (VANCOREADY) IVPB 500 mg/100 mL        500 mg 100 mL/hr over 60 Minutes Intravenous Every T-Th-Sa (Hemodialysis) 12/31/23 1219 01/01/24 1609   01/01/24 1200  ceFEPIme  (MAXIPIME ) 2 g in sodium chloride  0.9 % 100 mL IVPB        2 g 200 mL/hr over 30 Minutes Intravenous Once in dialysis 12/31/23 1219 01/01/24 1840   12/30/23 1400  vancomycin  (VANCOREADY) IVPB 500 mg/100 mL        500 mg 100 mL/hr over 60 Minutes Intravenous  Once 12/30/23 1310 12/30/23 2033   12/30/23 1400  ceFEPIme  (MAXIPIME ) 1 g in sodium chloride  0.9 % 100 mL IVPB        1 g 200 mL/hr over 30 Minutes Intravenous  Once 12/30/23 1310 12/30/23  2005   12/29/23 2230  ceFEPIme  (MAXIPIME ) 1 g in sodium chloride  0.9 % 100 mL IVPB        1 g 200 mL/hr over 30 Minutes Intravenous  Once 12/29/23 2131 12/30/23 0656   12/29/23 1200  vancomycin  (VANCOREADY) IVPB 500 mg/100 mL  Status:  Discontinued        500 mg 100 mL/hr over 60 Minutes Intravenous Every M-W-F (Hemodialysis) 12/25/23 1313 12/26/23 1056   12/29/23 1200  ceFEPIme  (MAXIPIME ) 2 g in sodium chloride  0.9 % 100 mL IVPB  Status:  Discontinued        2 g 200 mL/hr over 30 Minutes Intravenous Every M-W-F (Hemodialysis) 12/26/23 1056 12/31/23 1221   12/29/23 1200  vancomycin  (VANCOREADY) IVPB 500 mg/100 mL  Status:  Discontinued        500 mg 100 mL/hr over 60 Minutes Intravenous Every M-W-F (Hemodialysis) 12/26/23 1056 12/31/23 1221   12/27/23 1400  ceFEPIme  (MAXIPIME ) 2 g in sodium chloride  0.9 % 100 mL IVPB        2 g 200 mL/hr over 30 Minutes Intravenous  Once 12/27/23 1319  12/27/23 1443   12/27/23 1200  vancomycin  (VANCOREADY) IVPB 500 mg/100 mL  Status:  Discontinued        500 mg 100 mL/hr over 60 Minutes Intravenous Every Sat (Hemodialysis) 12/25/23 1313 12/26/23 1056   12/27/23 1200  ceFEPIme  (MAXIPIME ) 2 g in sodium chloride  0.9 % 100 mL IVPB  Status:  Discontinued        2 g 200 mL/hr over 30 Minutes Intravenous Every Sat (Hemodialysis) 12/26/23 1056 12/27/23 1318   12/27/23 1200  vancomycin  (VANCOREADY) IVPB 500 mg/100 mL        500 mg 100 mL/hr over 60 Minutes Intravenous Every Sat (Hemodialysis) 12/26/23 1056 12/27/23 1227   12/26/23 1700  ciprofloxacin  (CIPRO ) tablet 500 mg        500 mg Oral  Once 12/26/23 1056 12/26/23 1704   12/25/23 1915  ciprofloxacin  (CIPRO ) tablet 500 mg        500 mg Oral  Once 12/25/23 1819 12/25/23 1849   12/25/23 1800  ceFEPIme  (MAXIPIME ) 1 g in sodium chloride  0.9 % 100 mL IVPB  Status:  Discontinued        1 g 200 mL/hr over 30 Minutes Intravenous Every 24 hours 12/25/23 1316 12/26/23 1056   12/25/23 1200  ceFEPIme  (MAXIPIME ) 2 g in sodium chloride  0.9 % 100 mL IVPB  Status:  Discontinued        2 g 200 mL/hr over 30 Minutes Intravenous Once in dialysis 12/24/23 1739 12/25/23 1316   12/25/23 1200  vancomycin  (VANCOCIN ) IVPB 1000 mg/200 mL premix  Status:  Discontinued        1,000 mg 200 mL/hr over 60 Minutes Intravenous  Once 12/24/23 1739 12/24/23 1809   12/25/23 1200  vancomycin  (VANCOREADY) IVPB 500 mg/100 mL  Status:  Discontinued        500 mg 100 mL/hr over 60 Minutes Intravenous  Once 12/24/23 1809 12/25/23 1313   12/24/23 1200  ceFEPIme  (MAXIPIME ) 2 g in sodium chloride  0.9 % 100 mL IVPB  Status:  Discontinued        2 g 200 mL/hr over 30 Minutes Intravenous Every M-W-F (Hemodialysis) 12/22/23 0851 12/25/23 1316   12/19/23 0000  ceFEPime  (MAXIPIME ) IVPB        2 g Intravenous Every M-W-F (Hemodialysis) 12/19/23 1300 02/09/24 2359   12/19/23 0000  vancomycin  IVPB  500 mg Intravenous Every M-W-F  (Hemodialysis) 12/19/23 1300 02/09/24 2359   12/16/23 2200  ceFEPIme  (MAXIPIME ) 1 g in sodium chloride  0.9 % 100 mL IVPB        1 g 200 mL/hr over 30 Minutes Intravenous Every 24 hours 12/15/23 1348 12/22/23 2203   12/15/23 2230  vancomycin  (VANCOREADY) IVPB 500 mg/100 mL  Status:  Discontinued        500 mg 100 mL/hr over 60 Minutes Intravenous Every M-W-F (Hemodialysis) 12/15/23 2217 12/25/23 1313   12/15/23 1400  bictegravir-emtricitabine -tenofovir  AF (BIKTARVY ) 50-200-25 MG per tablet 1 tablet        1 tablet Oral Daily 12/15/23 1307     12/15/23 1347  vancomycin  variable dose per unstable renal function (pharmacist dosing)  Status:  Discontinued         Does not apply See admin instructions 12/15/23 1348 12/15/23 2218   12/15/23 1100  ceFEPIme  (MAXIPIME ) 2 g in sodium chloride  0.9 % 100 mL IVPB        2 g 200 mL/hr over 30 Minutes Intravenous  Once 12/15/23 1048 12/15/23 1130   12/15/23 1100  vancomycin  (VANCOCIN ) IVPB 1000 mg/200 mL premix        1,000 mg 200 mL/hr over 60 Minutes Intravenous  Once 12/15/23 1048 12/15/23 1154        MEDICATIONS: Scheduled Meds:  acetaminophen   1,000 mg Oral TID   artificial tears   Right Eye QID   bictegravir-emtricitabine -tenofovir  AF  1 tablet Oral Daily   Chlorhexidine  Gluconate Cloth  6 each Topical Q0600   darbepoetin (ARANESP ) injection - DIALYSIS  100 mcg Subcutaneous Q Fri-1800   fentaNYL   1 patch Transdermal Q72H   gabapentin   100 mg Oral TID   gatifloxacin   1 drop Right Eye BID   heparin   5,000 Units Subcutaneous Q8H   ibuprofen   600 mg Oral TID WC   insulin  aspart  0-5 Units Subcutaneous QHS   insulin  aspart  0-6 Units Subcutaneous TID WC   lidocaine   2 patch Transdermal Q24H   methocarbamol   500 mg Oral TID   midodrine   10 mg Oral TID WC   pantoprazole   40 mg Oral Q1200   polyethylene glycol  17 g Oral Daily   sevelamer  carbonate  800 mg Oral TID WC   sodium chloride  flush  10-40 mL Intracatheter Q12H   Continuous  Infusions:  anticoagulant sodium citrate      ceFEPime  (MAXIPIME ) IV     ceFEPime  (MAXIPIME ) IV     vancomycin      vancomycin      PRN Meds:.alteplase , anticoagulant sodium citrate , glucagon  (human recombinant), heparin , heparin , hydrALAZINE , ipratropium-albuterol , lidocaine  (PF), lidocaine -prilocaine , metoprolol  tartrate, ondansetron  (ZOFRAN ) IV, oxyCODONE , pentafluoroprop-tetrafluoroeth, sodium chloride  flush   I have personally reviewed following labs and imaging studies  LABORATORY DATA: CBC: Recent Labs  Lab 12/30/23 0000 01/02/24 0530  WBC 8.5 5.8  HGB 8.7* 8.4*  HCT 27.8* 25.8*  MCV 99.6 99.2  PLT 283 234    Basic Metabolic Panel: Recent Labs  Lab 12/28/23 0450 12/29/23 0820 12/30/23 0821 01/02/24 0530  NA 132* 130* 128* 130*  K 4.0 4.6 4.6 3.5  CL 94* 92* 90* 92*  CO2 23 23 22 24   GLUCOSE 160* 168* 157* 173*  BUN 15 25* 35* 17  CREATININE 4.13* 5.68* 7.05* 3.89*  CALCIUM  9.1 9.5 9.4 9.0  MG  --   --  2.4  --   PHOS  --   --   --  3.5  GFR: Estimated Creatinine Clearance: 12.5 mL/min (A) (by C-G formula based on SCr of 3.89 mg/dL (H)).  Liver Function Tests: Recent Labs  Lab 01/02/24 0530  ALBUMIN  2.6*   No results for input(s): LIPASE, AMYLASE in the last 168 hours. No results for input(s): AMMONIA in the last 168 hours.  Coagulation Profile: No results for input(s): INR, PROTIME in the last 168 hours.  Cardiac Enzymes: No results for input(s): CKTOTAL, CKMB, CKMBINDEX, TROPONINI in the last 168 hours.  BNP (last 3 results) No results for input(s): PROBNP in the last 8760 hours.  Lipid Profile: No results for input(s): CHOL, HDL, LDLCALC, TRIG, CHOLHDL, LDLDIRECT in the last 72 hours.  Thyroid Function Tests: No results for input(s): TSH, T4TOTAL, FREET4, T3FREE, THYROIDAB in the last 72 hours.  Anemia Panel: No results for input(s): VITAMINB12, FOLATE, FERRITIN, TIBC, IRON ,  RETICCTPCT in the last 72 hours.  Urine analysis:    Component Value Date/Time   COLORURINE YELLOW 07/04/2010 1840   APPEARANCEUR TURBID (A) 07/04/2010 1840   LABSPEC 1.013 07/04/2010 1840   PHURINE 7.0 07/04/2010 1840   GLUCOSEU NEG mg/dL 97/77/7987 8159   HGBUR TRACE (A) 03/13/2010 1824   BILIRUBINUR NEG 07/04/2010 1840   KETONESUR NEG mg/dL 97/77/7987 8159   PROTEINUR > 300 mg/dL (A) 97/77/7987 8159   UROBILINOGEN 0.2 07/04/2010 1840   NITRITE NEG 07/04/2010 1840   LEUKOCYTESUR LARGE (A) 07/04/2010 1840    Sepsis Labs: Lactic Acid, Venous    Component Value Date/Time   LATICACIDVEN 1.0 12/15/2023 0741    MICROBIOLOGY: No results found for this or any previous visit (from the past 240 hours).  RADIOLOGY STUDIES/RESULTS: No results found.    LOS: 19 days   Donalda Applebaum, MD  [facility]    To contact the attending provider between 7A-7P or the covering provider during after hours 7P-7A, please log into the web site [email or web address] and access using universal [place] password for that web site. If you do not have the password, please call the hospital operator.  01/03/2024, 11:13 AM

## 2024-01-04 LAB — GLUCOSE, CAPILLARY
Glucose-Capillary: 150 mg/dL — ABNORMAL HIGH (ref 70–99)
Glucose-Capillary: 146 mg/dL — ABNORMAL HIGH (ref 70–99)
Glucose-Capillary: 149 mg/dL — ABNORMAL HIGH (ref 70–99)
Glucose-Capillary: 163 mg/dL — ABNORMAL HIGH (ref 70–99)

## 2024-01-04 NOTE — Progress Notes (Signed)
 PROGRESS NOTE        PATIENT DETAILS Name: Alvin Daniels Age: 53 y.o. Sex: male Date of Birth: [birth year] Admit Date: 12/14/2023 Admitting Physician Marsha Ada, MD PCP:Pcp, No  Brief Summary: Patient is a 53 y.o.  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  Significant events: 8/4>> admit to [facility]  Significant studies: 6/4>> TEE: EF 60-65%, 1.8 x 1.2 mass right atrial wall-?vegetation from old HD catheter impinged on RA wall-versus mass/tumor. 8/4>> MRI pelvis: Discitis/osteomyelitis L4-5-right psoas abscess. 8/5>> CT abdomen/pelvis: Discitis/osteomyelitis L4-5 with paraspinal inflammatory changes/right psoas abscess.  8/8>> MRI LS spine: L4-5 discitis/osteomyelitis with associated paraspinal inflammatory changes, right psoas abscess. 8/20>> MRI left hip: Discitis of the lower lumbar spine with extension into the psoas muscle. 8/20>> BKA-no significant abnormality.  Significant microbiology data: 5/28>> blood culture: MRSA 5/29>> left finger abscess: E. coli/Streptococcus anginosus, Bacteroides fragilis. 8/4>> blood culture: No growth  Procedures: None.  Consults: ID Nephrology Ophthalmology  Subjective:  Patient in bed, appears comfortable, denies any headache, no fever, no chest pain or pressure, no shortness of breath , no abdominal pain. No new focal weakness.   Objective: Vitals: Blood pressure 112/67, pulse 79, temperature 98.4 F (36.9 C), temperature source Oral, resp. rate 15, height 4' 11 (1.499 m), weight 38.9 kg, SpO2 100%.   Exam:  Awake Alert, No new F.N deficits, Normal affect [place].AT,PERRAL Supple Neck, No JVD,   Symmetrical Chest wall movement, Good air  movement bilaterally, CTAB RRR,No Gallops, Rubs or new Murmurs,  +ve B.Sounds, Abd Soft, No tenderness,   Bilateral BKA, right IJ HD catheter   Assessment/Plan:  L4-L5 discitis with surrounding paraspinal inflammatory change and right psoas abscess Recent history of MRSA bacteremia/left fourth finger osteomyelitis May 2025 (strep/Bacteroides/E. coli on bone culture) Likely MRSA-although cultures this admission negative.   Evaluated by ID-recommendations are for vancomycin /cefepime  x 8 weeks-EOT 9/29 Reviewed prior notes-not a surgical candidate-IR unable to drain due to lack of safe window.  Severe low back pain-left hip pain Sciatica physiology due to L4-L5 osteomyelitis-with paraspinal inflammation/right psoas abscess He appears comfortable-but continues to have some pain issues which are now much more stable with scheduled Tylenol /NSAIDs/transdermal fentanyl /Lidoderm  patches-and as needed oxycodone . Patient is well aware that blood pressure limits any further increase in narcotics at this point.  He is already on midodrine -and hemodialysis will be challenging. Repeat MRI sometime in next week  Syncope on 8/20 Probably related to soft blood pressure/orthostatic hypotension Telemetry monitoring unremarkable.  Chronic hypotension On midodrine  Narcotics for back pain very not helping. Minimize narcotics as much as possible-avoid further escalation in narcotic dosing.  ESRD on HD MWF Nephrology following.  Normocytic anemia Secondary to ESRD Hb stable Aranesp /iron  defer to nephrology service Follow  PAD-s/p bilateral BKA Normally uses prosthesis Due to severe pain-he has been unable to ambulate with a prosthesis.  DM-2 (A1c 10.0 on 3/6) CBG stable SSI  Recent Labs    01/03/24 1606 01/03/24 2128 01/04/24 0809  GLUCAP 159* 176* 150*     HIV (CD4 254 on 5/29) ART  Chronic HCV Outpatient follow-up with infectious disease  Blurry vision secondary to neurotrophic  keratopathy Appreciate ophthalmology eval 8/20  Continue moxifloxacin eyedrops twice daily OD, ophthalmic lubricant 4 times daily OD  Chronic intrahepatic/extrahepatic biliary dilatation Incidental finding Per radiology-similar to prior CTs.  Difficult disposition No family-uninsured-SNF not a option per SW-history of ESRD with bilateral BKA-currently unable to ambulate with his prostheses-hence unable to be discharged home safely.   Normally-prior to this hospitalization was able to ambulate with prosthesis and catch the bus/scat transport to go back and forth from dialysis center.   Underweight: Estimated body mass index is 17.32 kg/m as calculated from the following:   Height as of this encounter: 4' 11 (1.499 m).   Weight as of this encounter: 38.9 kg.   Code status:   Code Status: Full Code   DVT Prophylaxis: heparin  injection 5,000 Units Start: 12/15/23 1400   Family Communication: None at bedside   Disposition Plan: Status is: Inpatient Remains inpatient appropriate because: Severe of illness   Planned Discharge Destination:Home health   Diet: Diet Order             Diet renal/carb modified with fluid restriction Diet-HS Snack? Nothing; Fluid restriction: 1200 mL Fluid; Room service appropriate? Yes; Fluid consistency: Thin  Diet effective now                   I have personally reviewed following labs and imaging studies  LABORATORY DATA:   Data Review:   Inpatient Medications  Scheduled Meds:  acetaminophen   1,000 mg Oral TID   artificial tears   Right Eye QID   bictegravir-emtricitabine -tenofovir  AF  1 tablet Oral Daily   Chlorhexidine  Gluconate Cloth  6 each Topical Q0600   darbepoetin (ARANESP ) injection - DIALYSIS  100 mcg Subcutaneous Q Fri-1800   fentaNYL   1 patch Transdermal Q72H   gabapentin   100 mg Oral TID   gatifloxacin   1 drop Right Eye BID   heparin   5,000 Units Subcutaneous Q8H   ibuprofen   600 mg Oral TID WC   insulin  aspart   0-5 Units Subcutaneous QHS   insulin  aspart  0-6 Units Subcutaneous TID WC   lidocaine   2 patch Transdermal Q24H   methocarbamol   500 mg Oral TID   midodrine   10 mg Oral TID WC   pantoprazole   40 mg Oral Q1200   polyethylene glycol  17 g Oral Daily   sevelamer  carbonate  800 mg Oral TID WC   sodium chloride  flush  10-40 mL Intracatheter Q12H   Continuous Infusions:  ceFEPime  (MAXIPIME ) IV Stopped (01/03/24 1313)   vancomycin  Stopped (01/03/24 1314)   PRN Meds:.glucagon  (human recombinant), hydrALAZINE , ipratropium-albuterol , metoprolol  tartrate, ondansetron  (ZOFRAN ) IV, oxyCODONE , sodium chloride  flush  DVT Prophylaxis  heparin  injection 5,000 Units Start: 12/15/23 1400     Recent Labs  Lab 12/30/23 0000 01/02/24 0530  WBC 8.5 5.8  HGB 8.7* 8.4*  HCT 27.8* 25.8*  PLT 283 234  MCV 99.6 99.2  MCH 31.2 32.3  MCHC 31.3 32.6  RDW 15.6* 15.7*    Recent Labs  Lab 12/29/23 0820 12/30/23 0821 01/02/24 0530  NA 130* 128* 130*  K 4.6 4.6 3.5  CL 92* 90* 92*  CO2 23 22 24   ANIONGAP 15 16* 14  GLUCOSE 168* 157* 173*  BUN 25* 35* 17  CREATININE 5.68* 7.05* 3.89*  ALBUMIN   --   --  2.6*  MG  --  2.4  --   PHOS  --   --  3.5  CALCIUM  9.5 9.4 9.0      Recent Labs  Lab 12/29/23 0820 12/30/23  9178 01/02/24 0530  MG  --  2.4  --   CALCIUM  9.5 9.4 9.0    --------------------------------------------------------------------------------------------------------------- Lab Results  Component Value Date   CHOL 166 04/16/2023   HDL 40 04/16/2023   LDLCALC 90 04/16/2023   TRIG 277 (H) 04/16/2023   CHOLHDL 4.2 04/16/2023    Lab Results  Component Value Date   HGBA1C 10.0 (A) 07/17/2023      Signature  -   Lavada Stank M.D on 01/04/2024 at 8:46 AM   -  To page go to [email or web address]

## 2024-01-04 NOTE — Progress Notes (Signed)
 [facility] Progress Note   Subjective:  Seen in room. Good spirits this morning. No issues with dialysis.    Objective Vitals:   01/03/24 1959 01/03/24 2318 01/04/24 0412 01/04/24 0800  BP: 109/68 129/80 100/64 112/67  Pulse: 89 86 79 79  Resp:  13 18 15   Temp: 99 F (37.2 C) 98.5 F (36.9 C) 98.2 F (36.8 C) 98.4 F (36.9 C)  TempSrc: Oral Oral Oral Oral  SpO2: 100%     Weight:      Height:       Physical Exam General: Alert, nad  Heart: RRR  Lungs: nml WOB on RA Abdomen: soft, ND Extremities: b/l BKA, no edema Dialysis Access: [facility]   Filed Weights   12/30/23 0803 01/03/24 0855 01/03/24 1240  Weight: 42.1 kg 40.3 kg 38.9 kg    Intake/Output Summary (Last 24 hours) at 01/04/2024 1134 Last data filed at 01/03/2024 1240 Gross per 24 hour  Intake --  Output 1400 ml  Net -1400 ml    Additional Objective Labs: Basic Metabolic Panel: Recent Labs  Lab 12/29/23 0820 12/30/23 0821 01/02/24 0530  NA 130* 128* 130*  K 4.6 4.6 3.5  CL 92* 90* 92*  CO2 23 22 24   GLUCOSE 168* 157* 173*  BUN 25* 35* 17  CREATININE 5.68* 7.05* 3.89*  CALCIUM  9.5 9.4 9.0  PHOS  --   --  3.5    CBC: Recent Labs  Lab 12/30/23 0000 01/02/24 0530  WBC 8.5 5.8  HGB 8.7* 8.4*  HCT 27.8* 25.8*  MCV 99.6 99.2  PLT 283 234    Medications:  ceFEPime  (MAXIPIME ) IV Stopped (01/03/24 1313)   vancomycin  Stopped (01/03/24 1314)    acetaminophen   1,000 mg Oral TID   artificial tears   Right Eye QID   bictegravir-emtricitabine -tenofovir  AF  1 tablet Oral Daily   Chlorhexidine  Gluconate Cloth  6 each Topical Q0600   darbepoetin (ARANESP ) injection - DIALYSIS  100 mcg Subcutaneous Q Fri-1800   fentaNYL   1 patch Transdermal Q72H   gabapentin   100 mg Oral TID   gatifloxacin   1 drop Right Eye BID   heparin   5,000 Units Subcutaneous Q8H   ibuprofen   600 mg Oral TID WC   insulin  aspart  0-5 Units Subcutaneous QHS   insulin  aspart  0-6 Units Subcutaneous TID WC    lidocaine   2 patch Transdermal Q24H   methocarbamol   500 mg Oral TID   midodrine   10 mg Oral TID WC   pantoprazole   40 mg Oral Q1200   polyethylene glycol  17 g Oral Daily   sevelamer  carbonate  800 mg Oral TID WC   sodium chloride  flush  10-40 mL Intracatheter Q12H    Dialysis Orders: [facility]-MWF 3.5h  B400  45.5kg  TDC  Heparin  2000 Mircera 100 mcg - last dose 12/01/23 Calcitriol  1.0 mcg (on hold due to high Ca)   Home bp meds: Midodrine  10mg  tid prn for SBP < 100 Midodrine  10mg  pre HD mwf prn   Assessment/Plan: 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 Not a surgical candidate. Blood cxs ng. Plan for IV Cefepime  2g and IV Vancomycin  500mg  with HD X 8 weeks end date 02/09/24. Pain management per primary team.   ESRD: Usual HD MWF.  Off schedule this week. Continue TTS schedule for now.  BP: chronic low BP on midodrine  10 tid prn and 10mg  pre HD mwf as needed for SBP < 100. Can give albumin  during HD  for low BP.  Volume:  UF as tolerated. Has lost weight. Now under EDW. Will need EDW changed at d/c.  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  No IV iron  due to infection.  HIV: controlled on Biktarvy  BMD:  Continue sevelamer  TID WC. VDRA on hold. Continue to trend Ca Nutrition - renal diet w/fluid restrictions. Debility: PT has requested that we schedule him for 2nd shift whenever we can.   Maisie Ronnald Acosta PA-C [facility] 01/04/2024,11:34 AM

## 2024-01-05 ENCOUNTER — Inpatient Hospital Stay (HOSPITAL_COMMUNITY): Payer: MEDICAID

## 2024-01-05 LAB — CBC WITH DIFFERENTIAL/PLATELET
Abs Immature Granulocytes: 0.03 K/uL (ref 0.00–0.07)
Basophils Absolute: 0.1 K/uL (ref 0.0–0.1)
Basophils Relative: 1 %
Eosinophils Absolute: 0.1 K/uL (ref 0.0–0.5)
Eosinophils Relative: 1 %
HCT: 30.7 % — ABNORMAL LOW (ref 39.0–52.0)
Hemoglobin: 9.8 g/dL — ABNORMAL LOW (ref 13.0–17.0)
Immature Granulocytes: 0 %
Lymphocytes Relative: 20 %
Lymphs Abs: 1.6 K/uL (ref 0.7–4.0)
MCH: 32 pg (ref 26.0–34.0)
MCHC: 31.9 g/dL (ref 30.0–36.0)
MCV: 100.3 fL — ABNORMAL HIGH (ref 80.0–100.0)
Monocytes Absolute: 0.7 K/uL (ref 0.1–1.0)
Monocytes Relative: 8 %
Neutro Abs: 5.8 K/uL (ref 1.7–7.7)
Neutrophils Relative %: 70 %
Platelets: 253 K/uL (ref 150–400)
RBC: 3.06 MIL/uL — ABNORMAL LOW (ref 4.22–5.81)
RDW: 16.1 % — ABNORMAL HIGH (ref 11.5–15.5)
WBC: 8.3 K/uL (ref 4.0–10.5)
nRBC: 0 % (ref 0.0–0.2)

## 2024-01-05 LAB — BASIC METABOLIC PANEL WITH GFR
Anion gap: 15 (ref 5–15)
BUN: 28 mg/dL — ABNORMAL HIGH (ref 6–20)
CO2: 22 mmol/L (ref 22–32)
Calcium: 9.5 mg/dL (ref 8.9–10.3)
Chloride: 93 mmol/L — ABNORMAL LOW (ref 98–111)
Creatinine, Ser: 5.31 mg/dL — ABNORMAL HIGH (ref 0.61–1.24)
GFR, Estimated: 12 mL/min — ABNORMAL LOW (ref >60)
Glucose, Bld: 158 mg/dL — ABNORMAL HIGH (ref 70–99)
Potassium: 4.5 mmol/L (ref 3.5–5.1)
Sodium: 130 mmol/L — ABNORMAL LOW (ref 135–145)

## 2024-01-05 LAB — GLUCOSE, CAPILLARY
Glucose-Capillary: 180 mg/dL — ABNORMAL HIGH (ref 70–99)
Glucose-Capillary: 191 mg/dL — ABNORMAL HIGH (ref 70–99)
Glucose-Capillary: 184 mg/dL — ABNORMAL HIGH (ref 70–99)
Glucose-Capillary: 138 mg/dL — ABNORMAL HIGH (ref 70–99)

## 2024-01-05 LAB — PHOSPHORUS: Phosphorus: 3.9 mg/dL (ref 2.5–4.6)

## 2024-01-05 LAB — SEDIMENTATION RATE: Sed Rate: 104 mm/h — ABNORMAL HIGH (ref 0–16)

## 2024-01-05 LAB — PROCALCITONIN: Procalcitonin: 15.67 ng/mL

## 2024-01-05 LAB — C-REACTIVE PROTEIN: CRP: 16.1 mg/dL — ABNORMAL HIGH (ref <1.0)

## 2024-01-05 MED ORDER — VANCOMYCIN HCL 500 MG/100ML IV SOLN
500.0000 mg | INTRAVENOUS | Status: DC
  Administered 2024-01-06 – 2024-02-07 (×16): 500 mg via INTRAVENOUS
  Filled 2024-01-05 (×15): qty 100

## 2024-01-05 MED ORDER — SODIUM CHLORIDE 0.9 % IV SOLN
2.0000 g | INTRAVENOUS | Status: DC
  Administered 2024-01-06: 2 g via INTRAVENOUS
  Filled 2024-01-05 (×3): qty 12.5

## 2024-01-05 NOTE — Progress Notes (Signed)
 OT Cancellation Note  Patient Details Name: Alvin Daniels MRN: [id number] DOB: February 02, 1971   Cancelled Treatment:    Reason Eval/Treat Not Completed: Patient at procedure or test/ unavailable (Patient off unit in HD. OT to reattempt when schedule permits)  [name] 01/05/2024, 12:44 PM  [name], OTA Acute Rehabilitation Services  Office [phone]

## 2024-01-05 NOTE — Progress Notes (Signed)
 PROGRESS NOTE        PATIENT DETAILS Name: Alvin Daniels Age: 53 y.o. Sex: male Date of Birth: 1970-11-02 Admit Date: 12/14/2023 Admitting Physician Marsha Ada, MD PCP:Pcp, No  Brief Summary: Patient is a 53 y.o.  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  Significant events: 8/4>> admit to [facility]  Significant studies: 6/4>> TEE: EF 60-65%, 1.8 x 1.2 mass right atrial wall-?vegetation from old HD catheter impinged on RA wall-versus mass/tumor. 8/4>> MRI pelvis: Discitis/osteomyelitis L4-5-right psoas abscess. 8/5>> CT abdomen/pelvis: Discitis/osteomyelitis L4-5 with paraspinal inflammatory changes/right psoas abscess.  8/8>> MRI LS spine: L4-5 discitis/osteomyelitis with associated paraspinal inflammatory changes, right psoas abscess. 8/20>> MRI left hip: Discitis of the lower lumbar spine with extension into the psoas muscle. 8/20>> BKA-no significant abnormality.  Significant microbiology data: 5/28>> blood culture: MRSA 5/29>> left finger abscess: E. coli/Streptococcus anginosus, Bacteroides fragilis. 8/4>> blood culture: No growth  Procedures: None.  Consults: ID Nephrology Ophthalmology  Subjective: Patient in bed, appears comfortable, denies any headache, no fever, no chest pain or pressure, no shortness of breath , no abdominal pain. No new focal weakness.    Objective: Vitals: Blood pressure 106/75, pulse 70, temperature 97.7 F (36.5 C), temperature source Oral, resp. rate (!) 9, height 4' 11 (1.499 m), weight 38.9 kg, SpO2 100%.   Exam:  Awake Alert, No new F.N deficits, Normal affect [place].AT,PERRAL Supple Neck, No JVD,   Symmetrical Chest wall movement, Good  air movement bilaterally, CTAB RRR,No Gallops, Rubs or new Murmurs,  +ve B.Sounds, Abd Soft, No tenderness,   Bilateral BKA, right IJ HD catheter   Assessment/Plan:  L4-L5 discitis with surrounding paraspinal inflammatory change and right psoas abscess Recent history of MRSA bacteremia/left fourth finger osteomyelitis May 2025 (strep/Bacteroides/E. coli on bone culture) Likely MRSA-although cultures this admission negative.   Evaluated by ID-recommendations are for vancomycin /cefepime  x 8 weeks-EOT 9/29 Reviewed prior notes-not a surgical candidate-IR unable to drain due to lack of safe window.  Severe low back pain-left hip pain Sciatica physiology due to L4-L5 osteomyelitis-with paraspinal inflammation/right psoas abscess He appears comfortable-but continues to have some pain issues which are now much more stable with scheduled Tylenol /NSAIDs/transdermal fentanyl /Lidoderm  patches-and as needed oxycodone . Patient is well aware that blood pressure limits any further increase in narcotics at this point.  He is already on midodrine -and hemodialysis will be challenging. Repeat MRI on 01/05/2024  Syncope on 8/20 Probably related to soft blood pressure/orthostatic hypotension Telemetry monitoring unremarkable.  Chronic hypotension On midodrine  Narcotics for back pain very not helping. Minimize narcotics as much as possible-avoid further escalation in narcotic dosing.  ESRD on HD MWF Nephrology following.  Normocytic anemia Secondary to ESRD Hb stable Aranesp /iron  defer to nephrology service Follow  PAD-s/p bilateral BKA Normally uses prosthesis Due to severe pain-he has been unable to ambulate with a prosthesis.  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  Difficult disposition No family-uninsured-SNF not a option per SW-history of ESRD with bilateral BKA-currently unable to ambulate with his prostheses-hence unable to be discharged home safely.   Normally-prior to this hospitalization was able to ambulate with prosthesis and catch the bus/scat transport to go back and forth from dialysis center.   Underweight: Estimated body mass index is 17.32 kg/m as calculated from the following:   Height as of this encounter: 4' 11 (1.499 m).   Weight as of this encounter: 38.9 kg.   Code status:   Code Status: Full Code   DVT Prophylaxis: heparin  injection 5,000 Units Start: 12/15/23 1400   Family Communication: None at bedside   Disposition Plan: Status is: Inpatient Remains inpatient appropriate because: Severe of illness   Planned Discharge Destination:Home health   Diet: Diet Order             Diet renal/carb modified with fluid restriction Diet-HS Snack? Nothing; Fluid restriction: 1200 mL Fluid; Room service appropriate? Yes; Fluid consistency: Thin  Diet effective now                   I have personally reviewed following labs and imaging studies  LABORATORY DATA:   Data Review:   Inpatient Medications  Scheduled Meds:  acetaminophen   1,000 mg Oral TID   artificial tears   Right Eye QID   bictegravir-emtricitabine -tenofovir  AF  1 tablet Oral Daily   Chlorhexidine  Gluconate Cloth  6 each Topical Q0600   darbepoetin (ARANESP ) injection - DIALYSIS  100 mcg Subcutaneous Q Fri-1800   fentaNYL   1 patch Transdermal Q72H   gabapentin   100 mg Oral TID   gatifloxacin   1 drop Right Eye BID   heparin   5,000 Units Subcutaneous Q8H   ibuprofen   600 mg Oral TID WC   insulin  aspart   0-5 Units Subcutaneous QHS   insulin  aspart  0-6 Units Subcutaneous TID WC   lidocaine   2 patch Transdermal Q24H   methocarbamol   500 mg Oral TID   midodrine   10 mg Oral TID WC   pantoprazole   40 mg Oral Q1200   polyethylene glycol  17 g Oral Daily   sevelamer  carbonate  800 mg Oral TID WC   sodium chloride  flush  10-40 mL Intracatheter Q12H   Continuous Infusions:  ceFEPime  (MAXIPIME ) IV Stopped (01/03/24 1313)   vancomycin  Stopped (01/03/24 1314)   PRN Meds:.glucagon  (human recombinant), hydrALAZINE , ipratropium-albuterol , metoprolol  tartrate, ondansetron  (ZOFRAN ) IV, oxyCODONE , sodium chloride  flush  DVT Prophylaxis  heparin  injection 5,000 Units Start: 12/15/23 1400     Recent Labs  Lab 12/30/23 0000 01/02/24 0530 01/05/24 0554  WBC 8.5 5.8 8.3  HGB 8.7* 8.4* 9.8*  HCT 27.8* 25.8* 30.7*  PLT 283 234 253  MCV 99.6 99.2 100.3*  MCH 31.2 32.3 32.0  MCHC 31.3 32.6 31.9  RDW 15.6* 15.7* 16.1*  LYMPHSABS  --   --  1.6  MONOABS  --   --  0.7  EOSABS  --   --  0.1  BASOSABS  --   --  0.1    Recent Labs  Lab 12/30/23 0821 01/02/24 0530 01/05/24 0554  NA 128* 130* 130*  K 4.6 3.5 4.5  CL 90* 92* 93*  CO2 22 24 22   ANIONGAP 16* 14 15  GLUCOSE 157* 173* 158*  BUN 35* 17 28*  CREATININE 7.05* 3.89*  5.31*  ALBUMIN   --  2.6*  --   CRP  --   --  16.1*  PROCALCITON  --   --  15.67  MG 2.4  --   --   PHOS  --  3.5 3.9  CALCIUM  9.4 9.0 9.5      Recent Labs  Lab 12/30/23 0821 01/02/24 0530 01/05/24 0554  CRP  --   --  16.1*  PROCALCITON  --   --  15.67  MG 2.4  --   --   CALCIUM  9.4 9.0 9.5    --------------------------------------------------------------------------------------------------------------- Lab Results  Component Value Date   CHOL 166 04/16/2023   HDL 40 04/16/2023   LDLCALC 90 04/16/2023   TRIG 277 (H) 04/16/2023   CHOLHDL 4.2 04/16/2023    Lab Results  Component Value Date   HGBA1C 10.0 (A) 07/17/2023      Signature  -    Lavada Stank M.D on 01/05/2024 at 10:28 AM   -  To page go to [email or web address]

## 2024-01-05 NOTE — Progress Notes (Signed)
 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 Not a surgical candidate. Blood cxs ng. Plan for IV Cefepime  2g and IV Vancomycin  500mg  with HD X 8 weeks end date 02/09/24. Pain management per primary team.   ESRD: Usual HD MWF.  Off schedule but will continue TTS schedule for now while he is in the hospital.  BP: chronic low BP on midodrine  10 tid prn and 10mg  pre HD mwf as needed for SBP < 100. Can give albumin  during HD for low BP.  Volume:  UF as tolerated. Has lost weight. Now under EDW. Will need EDW changed at d/c.  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  No IV iron  due to infection.  HIV: controlled on Biktarvy  BMD:  Continue sevelamer  TID WC. VDRA on hold. Continue to trend Ca; P3.9 8/25 Nutrition - renal diet w/fluid restrictions. Debility: PT has requested that we schedule him for 2nd shift whenever we can.   Subjective:  Seen in room. Good spirits this morning. No issues with dialysis.  Denies fever, chills, shortness of breath, chest pain.  Objective Vitals:   01/05/24 0000 01/05/24 0200 01/05/24 0415 01/05/24 0800  BP: 108/74  97/61 106/75  Pulse:   70   Resp: 11 15 10  (!) 9  Temp:   97.9 F (36.6 C) 97.7 F (36.5 C)  TempSrc:   Oral Oral  SpO2:      Weight:      Height:       Physical Exam General: Alert, nad  Heart: RRR  Lungs: nml WOB on RA Abdomen: soft, ND Extremities: b/l BKA, no edema Dialysis Access: Right IJ [facility]   Filed Weights   12/30/23 0803 01/03/24 0855 01/03/24 1240  Weight: 42.1 kg 40.3 kg 38.9 kg    Intake/Output Summary (Last 24 hours) at 01/05/2024 1122 Last data filed at 01/04/2024 2158 Gross per 24 hour  Intake 605 ml  Output  --  Net 605 ml    Additional Objective Labs: Basic Metabolic Panel: Recent Labs  Lab 12/30/23 0821 01/02/24 0530 01/05/24 0554  NA 128* 130* 130*  K 4.6 3.5 4.5  CL 90* 92* 93*  CO2 22 24 22   GLUCOSE 157* 173* 158*  BUN 35* 17 28*  CREATININE 7.05* 3.89* 5.31*  CALCIUM  9.4 9.0 9.5  PHOS  --  3.5 3.9    CBC: Recent Labs  Lab 12/30/23 0000 01/02/24 0530 01/05/24 0554  WBC 8.5 5.8 8.3  NEUTROABS  --   --  5.8  HGB 8.7* 8.4* 9.8*  HCT 27.8* 25.8* 30.7*  MCV 99.6 99.2 100.3*  PLT 283 234 253    Medications:  ceFEPime  (MAXIPIME ) IV Stopped (01/03/24 1313)   vancomycin  Stopped (01/03/24 1314)    acetaminophen   1,000 mg Oral TID   artificial tears   Right Eye QID   bictegravir-emtricitabine -tenofovir  AF  1 tablet Oral Daily   Chlorhexidine  Gluconate Cloth  6 each Topical Q0600   darbepoetin (ARANESP ) injection - DIALYSIS  100 mcg Subcutaneous Q Fri-1800   fentaNYL   1 patch Transdermal Q72H   gabapentin   100 mg Oral TID   gatifloxacin   1 drop Right  Eye BID   heparin   5,000 Units Subcutaneous Q8H   ibuprofen   600 mg Oral TID WC   insulin  aspart  0-5 Units Subcutaneous QHS   insulin  aspart  0-6 Units Subcutaneous TID WC   lidocaine   2 patch Transdermal Q24H   methocarbamol   500 mg Oral TID   midodrine   10 mg Oral TID WC   pantoprazole   40 mg Oral Q1200   polyethylene glycol  17 g Oral Daily   sevelamer  carbonate  800 mg Oral TID WC   sodium chloride  flush  10-40 mL Intracatheter Q12H

## 2024-01-05 NOTE — TOC Progression Note (Signed)
 Transition of Care [facility]) - Progression Note    Patient Details  Name: Alvin Daniels MRN: [id number] Date of Birth: [birth year]  [facility] of [facility] CM/SW Contact  Inocente GORMAN Kindle, LCSW Phone Number: 01/05/2024, 10:54 AM  Clinical Narrative:    ICM continuing to follow. Discharge barriers remain in place. Leadership aware. Patient has outpatient dialysis set up at [facility] [place] mwf with a 6:30a chair time.     Expected Discharge Plan: Home/Self Care Barriers to Discharge: Continued Medical Work up, Inadequate or no insurance (does not have adequate home support in place)               Expected Discharge Plan and Services In-house Referral: Clinical Social Work     Living arrangements for the past 2 months: Single Family Home                                       Social Drivers of Health (SDOH) Interventions SDOH Screenings   Food Insecurity: No Food Insecurity (12/15/2023)  Housing: Low Risk  (12/15/2023)  Transportation Needs: No Transportation Needs (12/15/2023)  Utilities: Not At Risk (12/15/2023)  Alcohol  Screen: Low Risk  (07/17/2023)  Depression (PHQ2-9): Low Risk  (07/17/2023)  Financial Resource Strain: Low Risk  (01/29/2023)  Physical Activity: Inactive (01/29/2023)  Social Connections: Moderately Isolated (10/08/2023)  Stress: No Stress Concern Present (01/29/2023)  Tobacco Use: Low Risk  (12/14/2023)  Health Literacy: Adequate Health Literacy (01/29/2023)    Readmission Risk Interventions     No data to display

## 2024-01-05 NOTE — Plan of Care (Signed)
  Problem: Education: Goal: Knowledge of General Education information will improve Description Including pain rating scale, medication(s)/side effects and non-pharmacologic comfort measures Outcome: Progressing   Problem: Health Behavior/Discharge Planning: Goal: Ability to manage health-related needs will improve Outcome: Progressing   Problem: Clinical Measurements: Goal: Ability to maintain clinical measurements within normal limits will improve Outcome: Progressing Goal: Diagnostic test results will improve Outcome: Progressing Goal: Respiratory complications will improve Outcome: Progressing Goal: Cardiovascular complication will be avoided Outcome: Progressing   Problem: Activity: Goal: Risk for activity intolerance will decrease Outcome: Progressing   Problem: Nutrition: Goal: Adequate nutrition will be maintained Outcome: Progressing   Problem: Coping: Goal: Level of anxiety will decrease Outcome: Progressing

## 2024-01-05 NOTE — Progress Notes (Signed)
 OT Cancellation Note  Patient Details Name: Alvin Daniels MRN: [id number] DOB: Apr 25, 1971   Cancelled Treatment:    Reason Eval/Treat Not Completed: Pain limiting ability to participate (Patient stating too much pain at this time and asked OT to try later. OT to reattempt as schedule permits)  [name] 01/05/2024, 8:16 AM  [name], OTA Acute Rehabilitation Services  Office [phone]

## 2024-01-06 LAB — GLUCOSE, CAPILLARY
Glucose-Capillary: 79 mg/dL (ref 70–99)
Glucose-Capillary: 98 mg/dL (ref 70–99)
Glucose-Capillary: 115 mg/dL — ABNORMAL HIGH (ref 70–99)
Glucose-Capillary: 125 mg/dL — ABNORMAL HIGH (ref 70–99)

## 2024-01-06 LAB — VANCOMYCIN, RANDOM: Vancomycin Rm: 16 ug/mL

## 2024-01-06 MED ORDER — HEPARIN SODIUM (PORCINE) 1000 UNIT/ML IJ SOLN
INTRAMUSCULAR | Status: AC
  Filled 2024-01-06: qty 4

## 2024-01-06 MED ORDER — MIDODRINE HCL 5 MG PO TABS
ORAL_TABLET | ORAL | Status: AC
  Filled 2024-01-06: qty 2

## 2024-01-06 NOTE — Progress Notes (Signed)
 Pt. Went to dialysis - he is off of the unit at shift change.

## 2024-01-06 NOTE — Progress Notes (Signed)
 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 Not a surgical candidate. Blood cxs ng. Plan for IV Cefepime  2g and IV Vancomycin  500mg  with HD X 8 weeks end date 02/09/24. Pain management per primary team.   ESRD: Usual HD MWF.  Off schedule but will continue TTS schedule for now while he is in the hospital.  Seen on dialysis through a right IJ TDC 1 L net UF as tolerated -> changed to keep even as he appears to be euvolemic and blood pressure is 77/52  BP: chronic low BP on midodrine  10 tid prn and 10mg  pre HD mwf as needed for SBP < 100. Can give albumin  during HD for low BP.  Volume:  UF as tolerated. Has lost weight. Now under EDW. Will need EDW changed at d/c.  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  No IV iron  due to infection.  HIV: controlled on Biktarvy  BMD:  Continue sevelamer  TID WC. VDRA on hold. Continue to trend Ca; P3.9 8/25 Nutrition - renal diet w/fluid restrictions. Debility: PT has requested that we schedule him for 2nd shift whenever we can.   Subjective:  Seen at dialysis. Good spirits this morning; he thinks he is getting better especially lower extremities. No issues with dialysis.  Denies fever, chills, shortness of breath, chest pain.  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 (left BCF was placed 05/10/2016)  Filed Weights   01/03/24 0855 01/03/24 1240 01/06/24 0731  Weight: 40.3 kg 38.9 kg 40.8 kg    Intake/Output Summary (Last 24 hours) at 01/06/2024 0934 Last data filed at 01/05/2024 2247 Gross per 24 hour  Intake 490 ml  Output 0 ml  Net 490 ml    Additional Objective Labs: Basic Metabolic Panel: Recent Labs  Lab 01/02/24 0530 01/05/24 0554  NA 130* 130*  K 3.5 4.5  CL 92* 93*  CO2 24 22  GLUCOSE 173* 158*  BUN 17 28*  CREATININE 3.89* 5.31*  CALCIUM  9.0 9.5  PHOS 3.5 3.9    CBC: Recent Labs  Lab 01/02/24 0530 01/05/24 0554  WBC 5.8 8.3  NEUTROABS  --  5.8  HGB 8.4* 9.8*  HCT 25.8* 30.7*  MCV 99.2 100.3*  PLT 234 253    Medications:  ceFEPime  (MAXIPIME ) IV     vancomycin       acetaminophen   1,000 mg Oral TID   artificial tears   Right Eye QID   bictegravir-emtricitabine -tenofovir  AF  1 tablet Oral Daily   Chlorhexidine  Gluconate Cloth  6 each Topical Q0600   darbepoetin (ARANESP ) injection -  DIALYSIS  100 mcg Subcutaneous Q Fri-1800   fentaNYL   1 patch Transdermal Q72H   gabapentin   100 mg Oral TID   gatifloxacin   1 drop Right Eye BID   heparin   5,000 Units Subcutaneous Q8H   ibuprofen   600 mg Oral TID WC   insulin  aspart  0-5 Units Subcutaneous QHS   insulin  aspart  0-6 Units Subcutaneous TID WC   lidocaine   2 patch Transdermal Q24H   methocarbamol   500 mg Oral TID   midodrine   10 mg Oral TID WC   pantoprazole   40 mg Oral Q1200   polyethylene glycol  17 g Oral Daily   sevelamer  carbonate  800 mg Oral TID WC   sodium chloride  flush  10-40 mL Intracatheter Q12H

## 2024-01-06 NOTE — Plan of Care (Signed)
  Problem: Education: Goal: Knowledge of General Education information will improve Description: Including pain rating scale, medication(s)/side effects and non-pharmacologic comfort measures Outcome: Progressing   Problem: Health Behavior/Discharge Planning: Goal: Ability to manage health-related needs will improve Outcome: Progressing   Problem: Clinical Measurements: Goal: Diagnostic test results will improve Outcome: Progressing   Problem: Activity: Goal: Risk for activity intolerance will decrease Outcome: Progressing   Problem: Nutrition: Goal: Adequate nutrition will be maintained Outcome: Progressing   Problem: Coping: Goal: Level of anxiety will decrease Outcome: Progressing   Problem: Safety: Goal: Ability to remain free from injury will improve Outcome: Progressing   Problem: Skin Integrity: Goal: Risk for impaired skin integrity will decrease Outcome: Progressing   Problem: Health Behavior/Discharge Planning: Goal: Ability to identify and utilize available resources and services will improve Outcome: Progressing   Problem: Metabolic: Goal: Ability to maintain appropriate glucose levels will improve Outcome: Progressing   Problem: Nutritional: Goal: Maintenance of adequate nutrition will improve Outcome: Progressing   Problem: Skin Integrity: Goal: Risk for impaired skin integrity will decrease Outcome: Progressing   Problem: Tissue Perfusion: Goal: Adequacy of tissue perfusion will improve Outcome: Progressing

## 2024-01-06 NOTE — Progress Notes (Signed)
 OT Cancellation Note  Patient Details Name: Alvin Daniels MRN: [id number] DOB: 03-01-1971   Cancelled Treatment:    Reason Eval/Treat Not Completed: Patient at procedure or test/ unavailable (Patient off unit in HD. OT to reattempt later today as schedule permits)  [name] 01/06/2024, 7:16 AM  [name], OTA Acute Rehabilitation Services  Office [phone]

## 2024-01-06 NOTE — Progress Notes (Signed)
 PROGRESS NOTE        PATIENT DETAILS Name: Alvin Daniels Age: 53 y.o. Sex: male Date of Birth: 10/12/1970 Admit Date: 12/14/2023 Admitting Physician Marsha Ada, MD PCP:Pcp, No  Brief Summary: Patient is a 53 y.o.  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  Significant events: 8/4>> admit to [facility]  Significant studies: 6/4>> TEE: EF 60-65%, 1.8 x 1.2 mass right atrial wall-?vegetation from old HD catheter impinged on RA wall-versus mass/tumor. 8/4>> MRI pelvis: Discitis/osteomyelitis L4-5-right psoas abscess. 8/5>> CT abdomen/pelvis: Discitis/osteomyelitis L4-5 with paraspinal inflammatory changes/right psoas abscess.  8/8>> MRI LS spine: L4-5 discitis/osteomyelitis with associated paraspinal inflammatory changes, right psoas abscess. 8/20>> MRI left hip: Discitis of the lower lumbar spine with extension into the psoas muscle. 8/20>> BKA-no significant abnormality.  Significant microbiology data: 5/28>> blood culture: MRSA 5/29>> left finger abscess: E. coli/Streptococcus anginosus, Bacteroides fragilis. 8/4>> blood culture: No growth  Procedures: None.  Consults: ID Nephrology Ophthalmology  Subjective: Patient in bed, appears comfortable, denies any headache, no fever, no chest pain or pressure, no shortness of breath , no abdominal pain. No focal weakness.    Objective: Vitals: Blood pressure (!) 69/59, pulse 77, temperature 97.8 F (36.6 C), temperature source Oral, resp. rate 12, height 4' 11 (1.499 m), weight 40.8 kg, SpO2 96%.   Exam:  Awake Alert, No new F.N deficits, Normal affect [place].AT,PERRAL Supple Neck, No JVD,   Symmetrical Chest wall movement, Good air  movement bilaterally, CTAB RRR,No Gallops, Rubs or new Murmurs,  +ve B.Sounds, Abd Soft, No tenderness,   Bilateral BKA, right IJ HD catheter   Assessment/Plan:  L4-L5 discitis with surrounding paraspinal inflammatory change and right psoas abscess Recent history of MRSA bacteremia/left fourth finger osteomyelitis May 2025 (strep/Bacteroides/E. coli on bone culture) Likely MRSA-although cultures this admission negative.   Evaluated by ID-recommendations are for vancomycin /cefepime  x 8 weeks-EOT 9/29 Reviewed prior notes-not a surgical candidate-IR unable to drain due to lack of safe window. Pain is in better control Repeat MRI on 01/05/2024 noted with progression of disease, will inform ID.  Poor operative candidate.  Syncope on 8/20 Probably related to soft blood pressure/orthostatic hypotension Telemetry monitoring unremarkable.  Chronic hypotension On midodrine  Narcotics for back pain very not helping. Minimize narcotics as much as possible-avoid further escalation in narcotic dosing.  ESRD on HD MWF Nephrology following.  Normocytic anemia Secondary to ESRD Hb stable Aranesp /iron  defer to nephrology service Follow  PAD-s/p bilateral BKA Normally uses prosthesis Due to severe pain-he has been unable to ambulate with a prosthesis.  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  Difficult disposition No family-uninsured-SNF not a option per SW-history of ESRD with bilateral BKA-currently unable to ambulate with his prostheses-hence unable to be discharged home safely.   Normally-prior to this hospitalization was able to ambulate with prosthesis and  catch the bus/scat transport to go back and forth from dialysis center.   DM-2 (A1c  10.0 on 3/6) CBG stable SSI  Recent Labs    01/05/24 1300 01/05/24 1741 01/05/24 2110  GLUCAP 191* 184* 138*        Code status:   Code Status: Full Code   DVT Prophylaxis: heparin  injection 5,000 Units Start: 12/15/23 1400   Family Communication: None at bedside   Disposition Plan: Status is: Inpatient Remains inpatient appropriate because: Severe of illness   Planned Discharge Destination:Home health   Diet: Diet Order             Diet renal/carb modified with fluid restriction Diet-HS Snack? Nothing; Fluid restriction: 1200 mL Fluid; Room service appropriate? Yes; Fluid consistency: Thin  Diet effective now                   I have personally reviewed following labs and imaging studies  LABORATORY DATA:   Data Review:   Inpatient Medications  Scheduled Meds:  acetaminophen   1,000 mg Oral TID   artificial tears   Right Eye QID   bictegravir-emtricitabine -tenofovir  AF  1 tablet Oral Daily   Chlorhexidine  Gluconate Cloth  6 each Topical Q0600   darbepoetin (ARANESP ) injection - DIALYSIS  100 mcg Subcutaneous Q Fri-1800   fentaNYL   1 patch Transdermal Q72H   gabapentin   100 mg Oral TID   gatifloxacin   1 drop Right Eye BID   heparin   5,000 Units Subcutaneous Q8H   ibuprofen   600 mg Oral TID WC   insulin  aspart  0-5 Units Subcutaneous QHS   insulin  aspart  0-6 Units Subcutaneous TID WC   lidocaine   2 patch Transdermal Q24H   methocarbamol   500 mg Oral TID   midodrine   10 mg Oral TID WC   pantoprazole   40 mg Oral Q1200   polyethylene glycol  17 g Oral Daily   sevelamer  carbonate  800 mg Oral TID WC   sodium chloride  flush  10-40 mL Intracatheter Q12H   Continuous Infusions:  ceFEPime  (MAXIPIME ) IV     vancomycin      PRN Meds:.glucagon  (human recombinant), hydrALAZINE , ipratropium-albuterol , metoprolol  tartrate, ondansetron  (ZOFRAN ) IV, oxyCODONE , sodium chloride   flush  DVT Prophylaxis  heparin  injection 5,000 Units Start: 12/15/23 1400     Recent Labs  Lab 01/02/24 0530 01/05/24 0554  WBC 5.8 8.3  HGB 8.4* 9.8*  HCT 25.8* 30.7*  PLT 234 253  MCV 99.2 100.3*  MCH 32.3 32.0  MCHC 32.6 31.9  RDW 15.7* 16.1*  LYMPHSABS  --  1.6  MONOABS  --  0.7  EOSABS  --  0.1  BASOSABS  --  0.1    Recent Labs  Lab 01/02/24 0530 01/05/24 0554  NA 130* 130*  K 3.5 4.5  CL 92* 93*  CO2 24 22  ANIONGAP 14 15  GLUCOSE 173* 158*  BUN 17 28*  CREATININE 3.89* 5.31*  ALBUMIN  2.6*  --   CRP  --  16.1*  PROCALCITON  --  15.67  PHOS 3.5 3.9  CALCIUM  9.0 9.5      Recent Labs  Lab 01/02/24 0530 01/05/24 0554  CRP  --  16.1*  PROCALCITON  --  15.67  CALCIUM  9.0 9.5    --------------------------------------------------------------------------------------------------------------- Lab Results  Component Value Date   CHOL 166 04/16/2023   HDL 40 04/16/2023   LDLCALC 90 04/16/2023   TRIG 277 (H) 04/16/2023   CHOLHDL 4.2 04/16/2023    Lab Results  Component Value Date   HGBA1C 10.0 (A) 07/17/2023      Signature  -  Lavada Stank M.D on 01/06/2024 at 9:19 AM   -  To page go to [email or web address]

## 2024-01-06 NOTE — Progress Notes (Signed)
  Received patient in bed to unit.   Informed consent signed and in chart. yes   TX duration: 3.5 hours     Transported by  Hand-off given to patient's nurse.  yes   Access used:  CVC Access issues:  None dressing changed   Total UF removed: 0 Medication(s) given: Midodrine  10 mg po Post HD VS: stable Post HD weight: 41.3     Valma Rotenberg LPN [facility]

## 2024-01-06 NOTE — Plan of Care (Signed)

## 2024-01-06 NOTE — Progress Notes (Signed)
 Pharmacy Antibiotic Note  Alvin Daniels is a 53 y.o. male admitted on 12/14/2023 with osteomyelitis.  Pharmacy has been consulted for vancomycin  and cefepime  dosing. He is ESRD on MWF HD.  Received 3 hours of HD today at BF rate of 400ml/min. Pre Hd vanc level was 16 wich is acceptable   Plan: Continue Vancomycin  500mg  IV qHD MWF Cefepime   2gm IV qHD MWF Monitor HD schedule/tolerance, clinical progress, vanc levels as indicated Abx end date: 9/29   Height: 4' 11 (149.9 cm) Weight: 41.3 kg (91 lb 0.8 oz) IBW/kg (Calculated) : 47.7  Temp (24hrs), Avg:97.7 F (36.5 C), Min:97.4 F (36.3 C), Max:97.8 F (36.6 C)  Recent Labs  Lab 01/02/24 0530 01/05/24 0554 01/06/24 0747  WBC 5.8 8.3  --   CREATININE 3.89* 5.31*  --   VANCORANDOM  --   --  16     Vanc 8/5 >> (9/29) Cefepime  8/5 >> (9/29) Cipro  8/14 >> 8/15 Biktarvy  (continued from PTA)    8/11 VT = 21  8/18 VR = 16, HD delayed until 8/19, BFR 350 x3 hrs 8/26 VR= 16  8/4 BCx - negative  Lova Urbieta A. Lyle, PharmD, BCPS, FNKF Clinical Pharmacist [place] Please utilize Amion for appropriate phone number to reach the unit pharmacist [facility])

## 2024-01-07 ENCOUNTER — Inpatient Hospital Stay (HOSPITAL_COMMUNITY): Payer: MEDICAID

## 2024-01-07 HISTORY — PX: IR LUMBAR DISC ASPIRATION W/IMG GUIDE: IMG5306

## 2024-01-07 HISTORY — PX: IR FLUORO GUIDED NEEDLE PLC ASPIRATION/INJECTION LOC: IMG2395

## 2024-01-07 LAB — GLUCOSE, CAPILLARY
Glucose-Capillary: 104 mg/dL — ABNORMAL HIGH (ref 70–99)
Glucose-Capillary: 120 mg/dL — ABNORMAL HIGH (ref 70–99)
Glucose-Capillary: 117 mg/dL — ABNORMAL HIGH (ref 70–99)
Glucose-Capillary: 118 mg/dL — ABNORMAL HIGH (ref 70–99)

## 2024-01-07 LAB — PROTIME-INR
INR: 1.1 (ref 0.8–1.2)
Prothrombin Time: 14.8 s (ref 11.4–15.2)

## 2024-01-07 MED ORDER — FENTANYL CITRATE (PF) 100 MCG/2ML IJ SOLN
INTRAMUSCULAR | Status: AC
  Filled 2024-01-07: qty 2

## 2024-01-07 MED ORDER — FENTANYL CITRATE (PF) 100 MCG/2ML IJ SOLN
INTRAMUSCULAR | Status: AC | PRN
  Administered 2024-01-07: 50 ug via INTRAVENOUS

## 2024-01-07 MED ORDER — LIDOCAINE HCL (PF) 1 % IJ SOLN
INTRAMUSCULAR | Status: AC
  Filled 2024-01-07: qty 30

## 2024-01-07 MED ORDER — MIDAZOLAM HCL 2 MG/2ML IJ SOLN
INTRAMUSCULAR | Status: AC | PRN
Start: 2024-01-07 — End: 2024-01-07
  Administered 2024-01-07: .5 mg via INTRAVENOUS

## 2024-01-07 MED ORDER — MIDAZOLAM HCL 2 MG/2ML IJ SOLN
INTRAMUSCULAR | Status: AC
  Filled 2024-01-07: qty 2

## 2024-01-07 MED ORDER — MIDODRINE HCL 5 MG PO TABS
10.0000 mg | ORAL_TABLET | ORAL | Status: DC
  Administered 2024-01-10 – 2024-01-15 (×2): 10 mg via ORAL
  Filled 2024-01-07 (×3): qty 2

## 2024-01-07 NOTE — Plan of Care (Signed)

## 2024-01-07 NOTE — Procedures (Signed)
  Procedure:  Fluoro guided L4/5 disc aspiration 3.87ml bloody fluid Preprocedure diagnosis: The primary encounter diagnosis was Psoas abscess ([name]). A diagnosis of Acute hematogenous osteomyelitis of other site [facility]) was also pertinent to this visit. Postprocedure diagnosis: same EBL:    minimal Complications:   none immediate  See full dictation in [employer].  CHARM Toribio Faes MD Main # [phone] Pager  [phone] Mobile [phone]

## 2024-01-07 NOTE — Plan of Care (Signed)

## 2024-01-07 NOTE — Consult Note (Addendum)
 Chief Complaint: Concern for L4-L5 Osteomyelitis. Request is for L4-L5 disc biopsy   Referring Physician(s): Dr, Alvin Daniels  Supervising Physician: Alvin Daniels  Patient Status: [name] - In-pt  History of Present Illness: Alvin Daniels is a 53 y.o. male Spanish speaking male inpatient. History of PAD, s/p bilateral BKA, ESRD on HD, HIV, Chronic HCV. Presented tot the ED at [facility] with right hip pain. Found to have concern for L4-L5 osteomyelitis with a psoas abscess. Team is requesting a L4-L5 disc aspiration. Case reviewed by IR Attending Dr.  Rover Alvin.  After review of imaging the the psoas abscess is too small for percutaneous drainage. Plan for L4-L5 disc biopsy.  Past Medical History:  Diagnosis Date   Acute bilateral low back pain 01/03/2021   AIDS ([name]) 11/22/2014   Back pain 06/09/2019   BKA stump complication ([name]) 06/09/2019   Chronic diarrhea    Chronic hepatitis C without hepatic coma ([name]) 11/22/2014   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[name])    Right   Corneal ulcer 12/18/2022   Descemetocele of left eye 12/18/2022   Diabetic neuropathy ([name])    ESRD (end stage renal disease) on dialysis ([name])    TTS; don't remember street name (05/03/2014)   Hepatitis C    HIV INFECTION 06/27/2010   Qualifier: Diagnosis of  By: Renae CMA ( AAMA), Jacqueline     Hypotension 06/02/2012   Metabolic bone disease 06/18/2012   MRSA infection    Normocytic anemia 06/17/2012   Pancreatitis    Pressure ulcer of BKA stump, stage 2 ([name]) 11/22/2014   Severe protein-calorie malnutrition ([name]) 06/17/2012   Uncontrolled diabetes mellitus with complications 06/27/2010   Annotation: uncontrolled Qualifier: Diagnosis of  By: Renae CMA ETTER FRUIT), Jacqueline     Vaccine counseling 05/29/2022    Past Surgical History:  Procedure Laterality Date   AMPUTATION Left 04/20/2014   Procedure: 3rd toe amputation, 4th Toe Amputation,  5th Toe Amputation;  Surgeon: Alvin Alvin GAILS,  MD;  Location: [facility] OR;  Service: Orthopedics;  Laterality: Left;   AMPUTATION Left 05/02/2014   Procedure: Midfoot Amputation;  Surgeon: Alvin Alvin GAILS, MD;  Location: [facility] OR;  Service: Orthopedics;  Laterality: Left;   AMPUTATION Left 06/17/2014   Procedure: AMPUTATION BELOW KNEE;  Surgeon: Alvin Alvin GAILS, MD;  Location: [facility] OR;  Service: Orthopedics;  Laterality: Left;   AMPUTATION Right 09/04/2018   Procedure: RIGHT BELOW KNEE AMPUTATION;  Surgeon: Alvin Alvin GAILS, MD;  Location: [facility] OR;  Service: Orthopedics;  Laterality: Right;  RIGHT BELOW KNEE AMPUTATION   AMPUTATION FINGER Left 10/09/2023   Procedure: AMPUTATION, FINGER;  Surgeon: Alvin Harari, MD;  Location: [facility] OR;  Service: Orthopedics;  Laterality: Left;  Left Ring Finger amputation   AV FISTULA PLACEMENT Left    AV FISTULA PLACEMENT Left 05/10/2016   Procedure: Creation Left Arm Brachiocephalic Arteriovenous Fistula and Ligation of Radiocephalic Fistula;  Surgeon: Alvin Daniels Blade, MD;  Location: [facility] OR;  Service: Vascular;  Laterality: Left;   DIALYSIS/PERMA CATHETER INSERTION N/A 09/01/2023   Procedure: DIALYSIS/PERMA CATHETER INSERTION;  Surgeon: Alvin Lynwood ORN, MD;  Location: [facility] INVASIVE CV LAB;  Service: Cardiovascular;  Laterality: N/A;   FEMUR IM NAIL Left 08/17/2016   Procedure: INTRAMEDULLARY (IM) RETROGRADE FEMORAL NAILING;  Surgeon: Alvin JAYSON Herald, MD;  Location: [facility] OR;  Service: Orthopedics;  Laterality: Left;   FOOT AMPUTATION THROUGH ANKLE Left 12/'21/2015   midfoot   IR AV DIALY SHUNT INTRO NEEDLE/INTRACATH INITIAL W/PTA/IMG LEFT  04/17/2023  IR FLUORO GUIDE CV LINE RIGHT  10/14/2023   IR FLUORO GUIDE CV LINE RIGHT  11/11/2023   IR GENERIC HISTORICAL Left 05/01/2016   IR THROMBECTOMY AV FISTULA W/THROMBOLYSIS/PTA INC/SHUNT/IMG LEFT 05/01/2016 Alvin Faes, MD [facility]INTER[facility]   IR GENERIC HISTORICAL  05/01/2016   IR US  GUIDE VASC ACCESS LEFT 05/01/2016 Alvin Faes, MD [facility]   IR GENERIC HISTORICAL  05/07/2016   IR  FLUORO GUIDE CV LINE RIGHT 05/07/2016 Alvin Bellman, DO [facility]   IR GENERIC HISTORICAL  05/07/2016   IR US  GUIDE VASC ACCESS RIGHT 05/07/2016 Alvin Bellman, DO [facility]   IR GENERIC HISTORICAL  05/22/2016   IR US  GUIDE VASC ACCESS RIGHT 05/22/2016 Alvin Specking, MD [facility]   IR GENERIC HISTORICAL  05/22/2016   IR FLUORO GUIDE CV LINE RIGHT 05/22/2016 Alvin Specking, MD [facility]   IR REMOVAL TUN CV CATH W/O FL  08/21/2016   IR REMOVAL TUN CV CATH W/O FL  10/12/2023   IR US  GUIDE VASC ACCESS LEFT  04/17/2023   IR US  GUIDE VASC ACCESS RIGHT  10/14/2023   PERIPHERAL VASCULAR CATHETERIZATION Left 05/09/2016   Procedure: A/V Fistulagram;  Surgeon: Alvin Daniels Blade, MD;  Location: [facility] INVASIVE CV LAB;  Service: Cardiovascular;  Laterality: Left;  arm   TEE WITHOUT CARDIOVERSION N/A 06/22/2018   Procedure: TRANSESOPHAGEAL ECHOCARDIOGRAM (TEE);  Surgeon: Alvin Alvin BROCKS, MD;  Location: [facility] ENDOSCOPY;  Service: Cardiovascular;  Laterality: N/A;   TRANSESOPHAGEAL ECHOCARDIOGRAM (CATH LAB) N/A 10/15/2023   Procedure: TRANSESOPHAGEAL ECHOCARDIOGRAM;  Surgeon: Alvin Ezra GORMAN, MD;  Location: [facility] INVASIVE CV LAB;  Service: Cardiovascular;  Laterality: N/A;    Allergies: Patient has no known allergies.  Medications: Prior to Admission medications   Medication Sig Start Date End Date Taking? Authorizing Provider  bictegravir-emtricitabine -tenofovir  AF (BIKTARVY ) 50-200-25 MG TABS tablet Take 1 tablet by mouth daily. 04/16/23  Yes Alvin Daniels, Jomarie SAILOR, MD  ceFEPime  (MAXIPIME ) IVPB Inject 2 g into the vein every Monday, Wednesday, and Friday with hemodialysis. Indication:   L4-5 discitis/osteo with psoas abscess Last Day of Therapy:  02/09/24 Labs - Once weekly:  CBC/D and BMP, ESR and CRP Method of administration: Per hemodialysis protocol - to be given at HD center 12/19/23 02/09/24 Yes Alvin Daniels, Jomarie SAILOR, MD  insulin  aspart protamine  - aspart (NOVOLOG  70/30 FLEXPEN) (70-30) 100 UNIT/ML FlexPen  Inject 20 Units into the skin 2 (two) times daily. Patient taking differently: Inject 10 Units into the skin 2 (two) times daily. 07/17/23  Yes Tanda Bleacher, MD  vancomycin  IVPB Inject 500 mg into the vein every Monday, Wednesday, and Friday with hemodialysis. Indication:   L4-5 discitis/osteo with psoas abscess Last Day of Therapy:  02/09/24 Labs - Once weekly:  CBC/D, BMP, and vancomycin  trough,  ESR and CRP Method of administration: Per hemodialysis protocol - to be given at HD center 12/19/23 02/09/24 Yes Alvin Daniels, Jomarie SAILOR, MD  ZYPITAMAG  4 MG TABS Take by mouth. 11/26/23  Yes [provider]  gabapentin  (NEURONTIN ) 100 MG capsule Take 1 capsule (100 mg total) by mouth at bedtime. Patient not taking: Reported on 12/16/2023 10/17/23 01/15/24  [place], Eric J, DO  HYDROcodone -acetaminophen  (NORCO/VICODIN) 5-325 MG tablet Take 1 tablet by mouth every 6 (six) hours as needed for severe pain (pain score 7-10) or moderate pain (pain score 4-6). Patient not taking: Reported on 12/15/2023 10/17/23   [place], Camellia PARAS, DO  midodrine  (PROAMATINE ) 10 MG tablet Take 1 tablet (10 mg total) by mouth 3 (three)  times daily as needed for low blood pressure, SBP</=100. may take 30 min prior to dialysis and mid treatment as needed. Patient not taking: Reported on 12/16/2023 09/05/23     bictegravir-emtricitabine -tenofovir  AF (BIKTARVY ) 50-200-25 MG TABS tablet Take 1 tablet by mouth daily. 01/02/22   Alvin Kathie Jomarie LOISE, MD     Family History  Problem Relation Age of Onset   Diabetes Mother    Diabetes Father     Social History   Socioeconomic History   Marital status: Single    Spouse name: Not on file   Number of children: Not on file   Years of education: Not on file   Highest education level: Not on file  Occupational History   Not on file  Tobacco Use   Smoking status: Never   Smokeless tobacco: Never  Vaping Use   Vaping status: Never Used  Substance and Sexual Activity   Alcohol  use: No   Drug  use: No   Sexual activity: Not Currently    Comment: declined condoms  Other Topics Concern   Not on file  Social History Narrative   ** Merged History Encounter **       Social Drivers of Health   Financial Resource Strain: Low Risk  (01/29/2023)   Overall Financial Resource Strain (CARDIA)    Difficulty of Paying Living Expenses: Not very hard  Food Insecurity: No Food Insecurity (12/15/2023)   Hunger Vital Sign    Worried About Running Out of Food in the Last Year: Never true    Ran Out of Food in the Last Year: Never true  Transportation Needs: No Transportation Needs (12/15/2023)   PRAPARE - Administrator, Civil Service (Medical): No    Lack of Transportation (Non-Medical): No  Physical Activity: Inactive (01/29/2023)   Exercise Vital Sign    Days of Exercise per Week: 0 days    Minutes of Exercise per Session: 0 min  Stress: No Stress Concern Present (01/29/2023)   [employer] of Occupational Health - Occupational Stress Questionnaire    Feeling of Stress : Not at all  Social Connections: Moderately Isolated (10/08/2023)   Social Connection and Isolation Panel    Frequency of Communication with Friends and Family: More than three times a week    Frequency of Social Gatherings with Friends and Family: More than three times a week    Attends Religious Services: More than 4 times per year    Active Member of [employer] or Organizations: No    Attends Banker Meetings: Never    Marital Status: Never married    Review of Systems: A 12 point ROS discussed and pertinent positives are indicated in the HPI above.  All other systems are negative.  Review of Systems  Musculoskeletal:  Positive for back pain.    Vital Signs: BP 103/78 (BP Location: Right Arm)   Pulse 94   Temp 97.9 F (36.6 C) (Oral)   Resp 16   Ht 4' 11 (1.499 m)   Wt 91 lb 0.8 oz (41.3 kg)   SpO2 100%   BMI 18.39 kg/m     Physical Exam Vitals and nursing note reviewed.   Constitutional:      General: He is not in acute distress. HENT:     Mouth/Throat:     Mouth: Mucous membranes are moist.     Pharynx: Oropharynx is clear.  Cardiovascular:     Rate and Rhythm: Normal rate and regular rhythm.  Pulmonary:  Effort: Pulmonary effort is normal.     Breath sounds: Normal breath sounds.  Abdominal:     Palpations: Abdomen is soft.     Tenderness: There is no abdominal tenderness.  Musculoskeletal:     Comments: + ttp of left lumbar region near L5 extending into left hip  Skin:    General: Skin is warm and dry.  Neurological:     Mental Status: He is alert and oriented to person, place, and time. Mental status is at baseline.     Imaging: MR LUMBAR SPINE WO CONTRAST Result Date: 01/05/2024 CLINICAL DATA:  Initial evaluation for suspected infection. Lumbar radiculopathy. EXAM: MRI LUMBAR SPINE WITHOUT CONTRAST TECHNIQUE: Multiplanar, multisequence MR imaging of the lumbar spine was performed. No intravenous contrast was administered. COMPARISON:  Prior study from 12/19/2023. FINDINGS: Segmentation: Standard. Lowest well-formed disc space labeled the L5-S1 level. Alignment: Straightening of the normal lumbar lordosis. No interval listhesis. Vertebrae: Persistent and ongoing changes of osteomyelitis discitis at L4-5. Marrow edema within the L4 vertebral body has progressed and worsened, with progressive fluid signal in density within the L4-5 interspace. Associated up to 50% height loss at L4 with 3-4 mm retropulsion, similar. Associated epidural phlegmon/abscess within the right greater than left ventral epidural space at the level of L4-5 appears progressed and worsened. Resultant severe spinal stenosis at this level has progressed and worsened. Associated paraspinous inflammatory changes again noted. Superimposed multiloculated bilateral psoas abscesses again seen, measuring up to 3.1 cm on the left and 3 cm on the right (series 5, images 30, 33). No other new  or distant sites of infection seen elsewhere on this noncontrast examination. Vertebral body height otherwise maintained. Decreased T1 signal intensity noted throughout the visualized bone marrow, nonspecific, but most commonly related to anemia, smoking or obesity. No worrisome osseous lesions. Conus medullaris and cauda equina: Conus extends to the L1 level. Conus and cauda equina appear normal. Paraspinal and other soft tissues: Paraspinous inflammatory changes with bilateral psoas some abscesses as above. Small volume sludge and/or stones noted within the gallbladder. Few scattered subcentimeter T2 hyperintense cyst noted about the visualized kidneys, benign in appearance, no follow-up imaging recommended. Disc levels: L1-2:  Unremarkable. L2-3:  Unremarkable. L3-4: Disc desiccation with minimal disc bulge. Mild facet spurring. No spinal stenosis. Foramina remain patent. L4-5: Changes of osteomyelitis discitis. Associated epidural abscess/phlegmon within the ventral epidural space. Superimposed bilateral facet hypertrophy with small joint effusions. Resultant severe multifactorial spinal stenosis, progressed from prior. Thecal sac measures 5 mm in AP diameter at its most narrow point, previously 7 mm on 12/19/2023. Moderate to severe right with moderate left L4 foraminal stenosis. L5-S1:  Unremarkable. IMPRESSION: 1. Persistent and ongoing changes of osteomyelitis discitis at L4-5. Associated epidural phlegmon/abscess within the ventral epidural space at L4-5 has progressed and worsened, with progressive severe spinal stenosis at this level. 2. Associated multiloculated bilateral psoas abscesses, measuring up to 3.1 cm on the left. 3. No other new or distant sites of infection elsewhere within the lumbar spine. Electronically Signed   By: Morene Hoard M.D.   On: 01/05/2024 19:51   MR KNEE LEFT WO CONTRAST Result Date: 12/31/2023 MR KNEE WITHOUT IV CONTRAST COMPARISON: None. CLINICAL HISTORY:  Below-the-knee amputation. Sepsis. PULSE SEQUENCES: Ax PD FS, Sag T2 ACL, Sag PD FS, Cor PD FS & COR T1 FINDINGS: Below-the-knee amputation changes are present. There is an intramedullary rod in the distal femur. There is a healed distal femur fracture. No significant joint effusion is present. The ACL is  not well seen. The PCL is intact. MCL and fibular collateral ligaments are unremarkable. Menisci are unremarkable. IMPRESSION: BKA without significant abnormality. Postsurgical changes are seen in distal femur with a healed distal femur fracture. Mild atrophy and myositis is present without drainable collection. Electronically signed by: Norleen Satchel MD 12/31/2023 07:52 PM EDT RP Workstation: [id number]   MR HIP LEFT WO CONTRAST Result Date: 12/31/2023 MR HIP WITHOUT IV CONTRAST LEFT COMPARISON: MRI lumbar spine 12/19/2023 CLINICAL HISTORY: History of bacteremia. Discitis/osteomyelitis. PULSE SEQUENCES: AX T1, Ax T2 FS, Cor T1, COR STIR & SMALL FOV COR PD FS with contrast. FINDINGS: There is mild degenerative arthrosis of the left hip with slight joint space loss and small joint effusion. The reconstructed process. Intramedullary rod is seen in the left femur. There is abnormal marrow signal in the pelvis and sacrum. This is likely related to anemia. There is fluid identified in the lower lumbar spine which appears to be at the L4-5 level with extension in the psoas muscles. There are likely bilateral psoas abscesses. Findings are consistent with discitis and osteomyelitis with associated psoas abscesses. Relatively stable to slightly progressed when compared with the recent MRI of the lumbar spine. Musculotendinous structures demonstrate mild myositis in bilateral hips which is nonspecific. IMPRESSION: Discitis osteomyelitis of the lower lumbar spine with extension in psoas muscles consistent with psoas muscle abscesses. See MRI lumbar spine for more detail. Repeat L-spine MRI may be beneficial as this appears  to have slightly progressed. There is likely reconversion red marrow in the sacrum and pelvis. Mild degenerative changes are seen in the hips without significant abnormality. Moderate myositis in the muscles which is nonspecific. Follow-up if symptoms progress. Electronically signed by: Norleen Satchel MD 12/31/2023 07:50 PM EDT RP Workstation: [id number]   MR Lumbar Spine W Wo Contrast Result Date: 12/19/2023 EXAM: MRI LUMBAR SPINE 12/19/2023 09:16:00 AM TECHNIQUE: Multiplanar multisequence MRI of the lumbar spine was performed with and without the administration of intravenous contrast. COMPARISON: MRI lumbar spine 10/10/2023 and MRI sacrum 12/15/2023. CLINICAL HISTORY: Low back pain, infection suspected. FINDINGS: BONES AND ALIGNMENT: Straightening of the lumbar lordosis. Irregularity of the L4 inferior endplate with height loss of the L4 vertebral body, new since the prior lumbar spine MRI. Vertebral body heights otherwise maintained. SPINAL CORD: The conus terminates normally. SOFT TISSUES: Edema within the bilateral psoas muscles from the level of L3-4 extending into the pelvis to at least the superior aspect of the sacrum. Additional small focus of peripheral enhancing fluid within the left psoas muscle, best seen on series 8 image 21. Remarkable enhancing soft tissue along the posterior aspect of the L4 and L5 vertebral bodies measuring up to 0.4 cm in thickness, concerning for epidural phlegmon. L1-L2: No significant disc herniation. No spinal canal stenosis or neural foraminal narrowing. L2-L3: No significant disc herniation. No spinal canal stenosis or neural foraminal narrowing. L3-L4: No significant disc herniation. No spinal canal stenosis or neural foraminal narrowing. L4-L5: Signal abnormality within the L4-5 disc with adjacent bone marrow signal abnormality and enhancement, suggestive of discitis/osteomyelitis. Trace soft tissue swelling along the anterior aspect of the L4-5 disc with a focal 0.6 x  0.8 x 0.8 cm peripheral enhancing collection, concerning for abscess, seen on series 9 image 27 and series 8 image 15. Additional irregular peripheral enhancing collection within the left psoas, compatible with abscess, measuring 1.3 x 1.3 x 2.5 cm. Prominent disc bulge at L4-5, slightly eccentric to the left, which indents the ventral thecal sac with lateral recess narrowing.  Facet arthrosis at L4-5 with moderate spinal canal stenosis at this level. Disc bulge and facet arthrosis also contribute to moderate-to-severe right and moderate left foraminal stenosis at L4-5. Moderate bilateral facet effusions at L4-5 without significant surrounding enhancement. L5-S1: No significant disc herniation. No spinal canal stenosis or neural foraminal narrowing. IMPRESSION: 1. Discitis/osteomyelitis at L4-5 with associated paraspinal inflammatory changes, right psoas abscess, and epidural phlegmon. 2. Additional small paraspinal abscess anterior to the L4-5 disc. Possible small psoas abscess on the left. 3. Moderate spinal canal stenosis at L4-5 due to disc bulge and facet arthrosis, with moderate-to-severe right and moderate left foraminal stenosis. Electronically signed by: Donnice Mania MD 12/19/2023 11:41 AM EDT RP Workstation: [id number]   CT ABDOMEN PELVIS W CONTRAST Result Date: 12/16/2023 CLINICAL DATA:  Concern for right psoas abscess on recent sacral MRI. Right hip pain. EXAM: CT ABDOMEN AND PELVIS WITH CONTRAST TECHNIQUE: Multidetector CT imaging of the abdomen and pelvis was performed using the standard protocol following bolus administration of intravenous contrast. RADIATION DOSE REDUCTION: This exam was performed according to the departmental dose-optimization program which includes automated exposure control, adjustment of the mA and/or kV according to patient size and/or use of iterative reconstruction technique. CONTRAST:  75mL OMNIPAQUE  IOHEXOL  350 MG/ML SOLN COMPARISON:  Abdominopelvic CT 10/08/2023.  Sacral  MRI 12/15/2023. FINDINGS: Technical note: Despite efforts by the technologist and patient, mild motion artifact is present on today's exam and could not be eliminated. This reduces exam sensitivity and specificity. Examination was performed with the patient in the right lateral decubitus position. Lower chest: Patchy scarring at both lung bases, similar to previous CT. Central line extends into the upper right atrium. There is coronary artery atherosclerosis. Hepatobiliary: The liver is normal in density without suspicious focal abnormality. Diffuse intra and extrahepatic biliary dilatation is again noted, similar to previous CT. The gallbladder is mildly distended without wall thickening or definite surrounding inflammation. Pancreas: Diffuse atrophy. No pancreatic ductal dilatation or surrounding inflammation identified. Spleen: Normal in size without focal abnormality. Adrenals/Urinary Tract: Both adrenal glands appear normal. Chronic renal cortical thinning and atrophy bilaterally with prominent renal vascular calcifications. No evidence of hydronephrosis or suspicious renal mass. The bladder appears unremarkable for its degree of distention. Stomach/Bowel: No enteric contrast administered. Mild generalized bowel wall thickening without significant distension or focal abnormality. Vascular/Lymphatic: There are no enlarged abdominal or pelvic lymph nodes. Severe aortic and branch vessel atherosclerosis without evidence of aneurysm or large vessel occlusion. Reproductive: The prostate gland and seminal vesicles appear unremarkable. Other: Mild generalized soft tissue edema consistent with anasarca. No ascites, focal extraluminal fluid collection or pneumoperitoneum. Musculoskeletal: As seen on recent pelvic MRI, there are findings of the L4-5 level suspicious for discitis and osteomyelitis. There are surrounding paraspinal inflammatory changes with heterogeneous enhancement in both psoas muscles. There is an  asymmetric abscess on the right, measuring up to 3.9 cm in length on coronal image 52/6. Overall findings are similar to the recent MRI. The sacroiliac joints appear normal. Previous left femoral ORIF noted. IMPRESSION: 1. Findings of discitis and osteomyelitis at L4-5 with surrounding paraspinal inflammatory changes and right psoas abscess, similar to recent MRI. Again, this could be further evaluated with lumbar MRI. 2. No other acute findings identified in the abdomen or pelvis. 3. Chronic intra and extrahepatic biliary dilatation, similar to previous CT. 4. Chronic renal cortical thinning and atrophy bilaterally. 5.  Aortic Atherosclerosis (ICD10-I70.0). Electronically Signed   By: Elsie Perone M.D.   On: 12/16/2023 13:04   MR  SACRUM SI JOINTS W WO CONTRAST Result Date: 12/15/2023 CLINICAL DATA:  Septic arthritis suspected, hip, xray done Right hip pain. EXAM: MRI PELVIS WITHOUT AND WITH CONTRAST TECHNIQUE: Multiplanar multisequence MR imaging of the pelvis with attention to the sacroiliac joints was performed both before and after administration of intravenous contrast. CONTRAST:  5mL GADAVIST  GADOBUTROL  1 MMOL/ML IV SOLN COMPARISON:  Right hip radiographs 12/14/2023. MRI of the sacrum 10/15/2023 and MRI of the right hip 10/13/2023. FINDINGS: Bones/Joint/Cartilage No evidence of sacroiliitis, osteomyelitis or acute fracture within the visualized bony pelvis. However, there is new T2 hyperintensity and heterogeneous enhancement associated with the L4-5 disc and inferior endplate of L4, concerning for discitis and osteomyelitis. Paraspinal inflammatory changes are further described below. Mild degenerative changes at the hips and sacroiliac joints without significant joint effusions. Artifact from left femoral intramedullary nail noted. Ligaments No significant ligamentous abnormalities are identified. Muscles and Tendons Heterogeneous enhancement within the psoas muscles bilaterally with a peripherally  enhancing fluid collection on the right measuring up to 2.2 x 1.2 x 4.1 cm, consistent with an abscess. No significant pelvic muscular abnormalities are identified. Soft tissue As above, findings suspicious for discitis and osteomyelitis at L4-5. There is heterogeneous paraspinal enhancement with extension into the psoas musculature as described above. No abscess or other focal fluid collection identified within the pelvis. IMPRESSION: 1. Findings are consistent with discitis and osteomyelitis at L4-5 with associated paraspinal inflammatory changes and right psoas abscess. Recommend dedicated lumbar MRI without and with contrast. 2. No evidence of sacroiliitis, osteomyelitis or septic arthritis within the pelvis. 3. Mild degenerative changes at the hips and sacroiliac joints. Electronically Signed   By: Elsie Perone M.D.   On: 12/15/2023 10:36   DG Hip Unilat W or Wo Pelvis 2-3 Views Right Result Date: 12/14/2023 CLINICAL DATA:  Right hip pain. EXAM: DG HIP (WITH OR WITHOUT PELVIS) 2-3V RIGHT COMPARISON:  None Available. FINDINGS: There is no evidence of acute hip fracture or dislocation. A radiopaque intramedullary rod and associated fixation screw are seen within the proximal left femoral shaft. There is no evidence of arthropathy or other focal bone abnormality. Marked severity vascular calcification is noted. IMPRESSION: 1. No acute findings. 2. Prior ORIF of the left femur. Electronically Signed   By: Suzen Dials M.D.   On: 12/14/2023 23:29    Labs:  CBC: Recent Labs    12/27/23 0513 12/30/23 0000 01/02/24 0530 01/05/24 0554  WBC 8.3 8.5 5.8 8.3  HGB 9.2* 8.7* 8.4* 9.8*  HCT 28.7* 27.8* 25.8* 30.7*  PLT 329 283 234 253    COAGS: Recent Labs    10/08/23 1947 12/15/23 0608 01/07/24 1226  INR 1.2 1.1 1.1  APTT 39*  --   --     BMP: Recent Labs    12/29/23 0820 12/30/23 0821 01/02/24 0530 01/05/24 0554  NA 130* 128* 130* 130*  K 4.6 4.6 3.5 4.5  CL 92* 90* 92* 93*   CO2 23 22 24 22   GLUCOSE 168* 157* 173* 158*  BUN 25* 35* 17 28*  CALCIUM  9.5 9.4 9.0 9.5  CREATININE 5.68* 7.05* 3.89* 5.31*  GFRNONAA 11* 9* 18* 12*    LIVER FUNCTION TESTS: Recent Labs    10/12/23 0608 10/13/23 0646 10/14/23 0822 10/15/23 1548 12/15/23 0608 12/17/23 0840 12/22/23 0811 01/02/24 0530  BILITOT 1.4* 1.2 1.4*  --  1.8*  --   --   --   AST 33 27 23  --  19  --   --   --  ALT 21 18 16   --  9  --   --   --   ALKPHOS 488* 432* 409*  --  74  --   --   --   PROT 6.9 6.8 7.3  --  10.3*  --   --   --   ALBUMIN  2.2* 2.1* 2.2*   < > 2.7* 2.3* 2.3* 2.6*   < > = values in this interval not displayed.    TUMOR MARKERS: No results for input(s): AFPTM, CEA, CA199, CHROMGRNA in the last 8760 hours.  Assessment and Plan:  53 y.o. Spanish speaking male inpatient. History of PAD, s/p bilateral BKA, ESRD on HD, HIV, Chronic HCV. Presented tot the ED at [facility] with right hip pain. Found to have concern for L4-L5 osteomyelitis with a psoas abscess. Team is requesting a L4-L5 disc aspiration. Case reviewed by IR Attending Dr.  Ester Sides.  After review of imaging the the psoas abscess is too small for percutaneous drainage. Plan for L4-L5 disc biopsy.   PLAN: IR Image Guided L4 - L5 Disc Biopsy   Risks and benefits of L4-L5 disc biopsy was discussed with the patient and/or patient's family including, but not limited to bleeding, infection, damage to adjacent structures or low yield requiring additional tests.  All of the questions were answered and there is agreement to proceed.  Consent signed and in chart.   Thank you for this interesting consult.  I greatly enjoyed meeting Alvin Daniels and look forward to participating in their care.  A copy of this report was sent to the requesting provider on this date.  Electronically Signed: Kimble VEAR Clas, PA-C 01/07/2024, 1:42 PM   I spent a total of 20 Minutes    in face to face in clinical  consultation, greater than 50% of which was counseling/coordinating care for L4-L5 disc biopsy and aspiration.

## 2024-01-07 NOTE — Progress Notes (Signed)
 OT Cancellation Note  Patient Details Name: Alvin Daniels MRN: [id number] DOB: June 25, 1970   Cancelled Treatment:    Reason Eval/Treat Not Completed: Patient declined, no reason specified (Pt c/o back, hip, and LLE pain. He reports being too achy, unable to even perform bed level theraband exercises. OT will follow-up with pt as able. left therabands in room)  01/07/2024  AB, OTR/L  Acute Rehabilitation Services  Office: [phone]   [name] 01/07/2024, 3:11 PM

## 2024-01-07 NOTE — Plan of Care (Signed)
 Problem: Education: Goal: Knowledge of General Education information will improve Description: Including pain rating scale, medication(s)/side effects and non-pharmacologic comfort measures 01/07/2024 0347 by Jori Roderic CROME, RN Outcome: Progressing 01/07/2024 0346 by Jori Roderic CROME, RN Outcome: Progressing 01/07/2024 0346 by Jori Roderic CROME, RN Outcome: Progressing   Problem: Health Behavior/Discharge Planning: Goal: Ability to manage health-related needs will improve 01/07/2024 0347 by Jori Roderic CROME, RN Outcome: Progressing 01/07/2024 0346 by Jori Roderic CROME, RN Outcome: Progressing 01/07/2024 0346 by Jori Roderic CROME, RN Outcome: Progressing   Problem: Clinical Measurements: Goal: Ability to maintain clinical measurements within normal limits will improve 01/07/2024 0347 by Jori Roderic CROME, RN Outcome: Progressing 01/07/2024 0346 by Jori Roderic CROME, RN Outcome: Progressing 01/07/2024 0346 by Jori Roderic CROME, RN Outcome: Progressing Goal: Will remain free from infection 01/07/2024 0347 by Jori Roderic CROME, RN Outcome: Progressing 01/07/2024 0346 by Jori Roderic CROME, RN Outcome: Progressing 01/07/2024 0346 by Jori Roderic CROME, RN Outcome: Progressing Goal: Diagnostic test results will improve 01/07/2024 0347 by Jori Roderic CROME, RN Outcome: Progressing 01/07/2024 0346 by Jori Roderic CROME, RN Outcome: Progressing 01/07/2024 0346 by Jori Roderic CROME, RN Outcome: Progressing Goal: Respiratory complications will improve 01/07/2024 0347 by Jori Roderic CROME, RN Outcome: Progressing 01/07/2024 0346 by Jori Roderic CROME, RN Outcome: Progressing 01/07/2024 0346 by Jori Roderic CROME, RN Outcome: Progressing Goal: Cardiovascular complication will be avoided 01/07/2024 0347 by Jori Roderic CROME, RN Outcome: Progressing 01/07/2024 0346 by Jori Roderic CROME, RN Outcome: Progressing 01/07/2024 0346 by Jori Roderic CROME, RN Outcome: Progressing   Problem: Activity: Goal: Risk for activity intolerance will decrease 01/07/2024 0347 by Jori Roderic CROME, RN Outcome: Progressing 01/07/2024 0346 by Jori Roderic CROME, RN Outcome: Progressing 01/07/2024 0346 by Jori Roderic CROME, RN Outcome: Progressing   Problem: Nutrition: Goal: Adequate nutrition will be maintained 01/07/2024 0347 by Jori Roderic CROME, RN Outcome: Progressing 01/07/2024 0346 by Jori Roderic CROME, RN Outcome: Progressing 01/07/2024 0346 by Jori Roderic CROME, RN Outcome: Progressing   Problem: Coping: Goal: Level of anxiety will decrease 01/07/2024 0347 by Jori Roderic CROME, RN Outcome: Progressing 01/07/2024 0346 by Jori Roderic CROME, RN Outcome: Progressing 01/07/2024 0346 by Jori Roderic CROME, RN Outcome: Progressing   Problem: Elimination: Goal: Will not experience complications related to bowel motility 01/07/2024 0347 by Jori Roderic CROME, RN Outcome: Progressing 01/07/2024 0346 by Jori Roderic CROME, RN Outcome: Progressing 01/07/2024 0346 by Jori Roderic CROME, RN Outcome: Progressing Goal: Will not experience complications related to urinary retention 01/07/2024 0347 by Jori Roderic CROME, RN Outcome: Progressing 01/07/2024 0346 by Jori Roderic CROME, RN Outcome: Progressing 01/07/2024 0346 by Jori Roderic CROME, RN Outcome: Progressing   Problem: Pain Managment: Goal: General experience of comfort will improve and/or be controlled 01/07/2024 0347 by Jori Roderic CROME, RN Outcome: Progressing 01/07/2024 0346 by Jori Roderic CROME, RN Outcome: Progressing 01/07/2024 0346 by Jori Roderic CROME, RN Outcome: Progressing   Problem: Safety: Goal: Ability to remain free from injury will improve 01/07/2024 0347 by Jori Roderic CROME, RN Outcome: Progressing 01/07/2024 0346 by Jori Roderic CROME, RN Outcome: Progressing 01/07/2024 0346 by Jori Roderic CROME,  RN Outcome: Progressing   Problem: Skin Integrity: Goal: Risk for impaired skin integrity will decrease 01/07/2024 0347 by Jori Roderic CROME, RN Outcome: Progressing 01/07/2024 0346 by Jori Roderic CROME, RN Outcome: Progressing 01/07/2024 0346 by Jori Roderic CROME, RN Outcome: Progressing   Problem: Education: Goal: Ability to describe self-care measures that may prevent or decrease complications (Diabetes Survival Skills Education) will improve 01/07/2024 0347 by  Jori Roderic CROME, RN Outcome: Progressing 01/07/2024 0346 by Jori Roderic CROME, RN Outcome: Progressing 01/07/2024 0346 by Jori Roderic CROME, RN Outcome: Progressing Goal: Individualized Educational Video(s) 01/07/2024 0347 by Jori Roderic CROME, RN Outcome: Progressing 01/07/2024 0346 by Jori Roderic CROME, RN Outcome: Progressing 01/07/2024 0346 by Jori Roderic CROME, RN Outcome: Progressing   Problem: Coping: Goal: Ability to adjust to condition or change in health will improve 01/07/2024 0347 by Jori Roderic CROME, RN Outcome: Progressing 01/07/2024 0346 by Jori Roderic CROME, RN Outcome: Progressing 01/07/2024 0346 by Jori Roderic CROME, RN Outcome: Progressing   Problem: Fluid Volume: Goal: Ability to maintain a balanced intake and output will improve 01/07/2024 0347 by Jori Roderic CROME, RN Outcome: Progressing 01/07/2024 0346 by Jori Roderic CROME, RN Outcome: Progressing 01/07/2024 0346 by Jori Roderic CROME, RN Outcome: Progressing   Problem: Health Behavior/Discharge Planning: Goal: Ability to identify and utilize available resources and services will improve 01/07/2024 0347 by Jori Roderic CROME, RN Outcome: Progressing 01/07/2024 0346 by Jori Roderic CROME, RN Outcome: Progressing 01/07/2024 0346 by Jori Roderic CROME, RN Outcome: Progressing Goal: Ability to manage health-related needs will improve 01/07/2024 0347 by Jori Roderic CROME, RN Outcome:  Progressing 01/07/2024 0346 by Jori Roderic CROME, RN Outcome: Progressing 01/07/2024 0346 by Jori Roderic CROME, RN Outcome: Progressing   Problem: Metabolic: Goal: Ability to maintain appropriate glucose levels will improve 01/07/2024 0347 by Jori Roderic CROME, RN Outcome: Progressing 01/07/2024 0346 by Jori Roderic CROME, RN Outcome: Progressing 01/07/2024 0346 by Jori Roderic CROME, RN Outcome: Progressing   Problem: Nutritional: Goal: Maintenance of adequate nutrition will improve 01/07/2024 0347 by Jori Roderic CROME, RN Outcome: Progressing 01/07/2024 0346 by Jori Roderic CROME, RN Outcome: Progressing 01/07/2024 0346 by Jori Roderic CROME, RN Outcome: Progressing Goal: Progress toward achieving an optimal weight will improve 01/07/2024 0347 by Jori Roderic CROME, RN Outcome: Progressing 01/07/2024 0346 by Jori Roderic CROME, RN Outcome: Progressing 01/07/2024 0346 by Jori Roderic CROME, RN Outcome: Progressing   Problem: Skin Integrity: Goal: Risk for impaired skin integrity will decrease 01/07/2024 0347 by Jori Roderic CROME, RN Outcome: Progressing 01/07/2024 0346 by Jori Roderic CROME, RN Outcome: Progressing 01/07/2024 0346 by Jori Roderic CROME, RN Outcome: Progressing   Problem: Tissue Perfusion: Goal: Adequacy of tissue perfusion will improve 01/07/2024 0347 by Jori Roderic CROME, RN Outcome: Progressing 01/07/2024 0346 by Jori Roderic CROME, RN Outcome: Progressing 01/07/2024 0346 by Jori Roderic CROME, RN Outcome: Progressing

## 2024-01-07 NOTE — Progress Notes (Signed)
 PROGRESS NOTE        PATIENT DETAILS Name: Alvin Daniels Age: 53 y.o. Sex: male Date of Birth: 05/28/1970 Admit Date: 12/14/2023 Admitting Physician Marsha Ada, MD PCP:Pcp, No  Brief Summary: Patient is a 53 y.o.  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  Significant events: 8/4>> admit to [facility]  Significant studies: 6/4>> TEE: EF 60-65%, 1.8 x 1.2 mass right atrial wall-?vegetation from old HD catheter impinged on RA wall-versus mass/tumor. 8/4>> MRI pelvis: Discitis/osteomyelitis L4-5-right psoas abscess. 8/5>> CT abdomen/pelvis: Discitis/osteomyelitis L4-5 with paraspinal inflammatory changes/right psoas abscess.  8/8>> MRI LS spine: L4-5 discitis/osteomyelitis with associated paraspinal inflammatory changes, right psoas abscess. 8/20>> MRI left hip: Discitis of the lower lumbar spine with extension into the psoas muscle. 8/20>> BKA-no significant abnormality.  Significant microbiology data: 5/28>> blood culture: MRSA 5/29>> left finger abscess: E. coli/Streptococcus anginosus, Bacteroides fragilis. 8/4>> blood culture: No growth  Procedures: None.  Consults: ID Nephrology Ophthalmology  Subjective:  Patient in bed, appears comfortable, denies any headache, no fever, no chest pain or pressure, no shortness of breath , no abdominal pain. No new focal weakness.    Objective: Vitals: Blood pressure 111/63, pulse 94, temperature 98.6 F (37 C), temperature source Oral, resp. rate 16, height 4' 11 (1.499 m), weight 41.3 kg, SpO2 100%.   Exam:  Awake Alert, No new F.N deficits, Normal affect Yarnell.AT,PERRAL Supple Neck, No JVD,   Symmetrical Chest wall movement, Good air  movement bilaterally, CTAB RRR,No Gallops, Rubs or new Murmurs,  +ve B.Sounds, Abd Soft, No tenderness,   Bilateral BKA, right IJ HD catheter   Assessment/Plan:  L4-L5 discitis with surrounding paraspinal inflammatory change and right psoas abscess Recent history of MRSA bacteremia/left fourth finger osteomyelitis May 2025 (strep/Bacteroides/E. coli on bone culture) Likely MRSA-although cultures this admission negative.   Evaluated by ID-recommendations are for vancomycin /cefepime  x 8 weeks-EOT 9/29 Reviewed prior notes-not a surgical candidate-IR unable to drain due to lack of safe window. Pain is in better control Repeat MRI on 01/05/2024 noted with progression of disease, will inform ID.  Poor operative candidate.  Syncope on 8/20 Probably related to soft blood pressure/orthostatic hypotension Telemetry monitoring unremarkable.  Chronic hypotension On midodrine  Narcotics for back pain very not helping. Minimize narcotics as much as possible-avoid further escalation in narcotic dosing.  ESRD on HD MWF Nephrology following.  Normocytic anemia Secondary to ESRD Hb stable Aranesp /iron  defer to nephrology service Follow  PAD-s/p bilateral BKA Normally uses prosthesis Due to severe pain-he has been unable to ambulate with a prosthesis.  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  Difficult disposition No family-uninsured-SNF not a option per SW-history of ESRD with bilateral BKA-currently unable to ambulate with his prostheses-hence unable to be discharged home safely.   Normally-prior to this hospitalization was able to ambulate with prosthesis and  catch the bus/scat transport to go back and forth from dialysis center.   DM-2 (  A1c 10.0 on 3/6) CBG stable SSI  Recent Labs    01/06/24 1806 01/06/24 2113 01/07/24 0833  GLUCAP 115* 125* 104*        Code status:   Code Status: Full Code   DVT Prophylaxis: heparin  injection 5,000 Units Start: 12/15/23 1400   Family Communication: None at bedside   Disposition Plan: Status is: Inpatient Remains inpatient appropriate because: Severe of illness   Planned Discharge Destination:Home health   Diet: Diet Order             Diet renal/carb modified with fluid restriction Diet-HS Snack? Nothing; Fluid restriction: 1200 mL Fluid; Room service appropriate? Yes; Fluid consistency: Thin  Diet effective now                   I have personally reviewed following labs and imaging studies  LABORATORY DATA:   Data Review:   Inpatient Medications  Scheduled Meds:  acetaminophen   1,000 mg Oral TID   artificial tears   Right Eye QID   bictegravir-emtricitabine -tenofovir  AF  1 tablet Oral Daily   Chlorhexidine  Gluconate Cloth  6 each Topical Q0600   darbepoetin (ARANESP ) injection - DIALYSIS  100 mcg Subcutaneous Q Fri-1800   fentaNYL   1 patch Transdermal Q72H   gabapentin   100 mg Oral TID   gatifloxacin   1 drop Right Eye BID   heparin   5,000 Units Subcutaneous Q8H   ibuprofen   600 mg Oral TID WC   insulin  aspart  0-5 Units Subcutaneous QHS   insulin  aspart  0-6 Units Subcutaneous TID WC   lidocaine   2 patch Transdermal Q24H   methocarbamol   500 mg Oral TID   midodrine   10 mg Oral TID WC   [START ON 01/08/2024] midodrine   10 mg Oral Q T,Th,Sa-HD   pantoprazole   40 mg Oral Q1200   polyethylene glycol  17 g Oral Daily   sevelamer  carbonate  800 mg Oral TID WC   sodium chloride  flush  10-40 mL Intracatheter Q12H   Continuous Infusions:  ceFEPime  (MAXIPIME ) IV Stopped (01/06/24 1912)   vancomycin  Stopped (01/06/24 1452)   PRN Meds:.glucagon  (human recombinant),  hydrALAZINE , ipratropium-albuterol , metoprolol  tartrate, ondansetron  (ZOFRAN ) IV, oxyCODONE , sodium chloride  flush  DVT Prophylaxis  heparin  injection 5,000 Units Start: 12/15/23 1400     Recent Labs  Lab 01/02/24 0530 01/05/24 0554  WBC 5.8 8.3  HGB 8.4* 9.8*  HCT 25.8* 30.7*  PLT 234 253  MCV 99.2 100.3*  MCH 32.3 32.0  MCHC 32.6 31.9  RDW 15.7* 16.1*  LYMPHSABS  --  1.6  MONOABS  --  0.7  EOSABS  --  0.1  BASOSABS  --  0.1    Recent Labs  Lab 01/02/24 0530 01/05/24 0554  NA 130* 130*  K 3.5 4.5  CL 92* 93*  CO2 24 22  ANIONGAP 14 15  GLUCOSE 173* 158*  BUN 17 28*  CREATININE 3.89* 5.31*  ALBUMIN  2.6*  --   CRP  --  16.1*  PROCALCITON  --  15.67  PHOS 3.5 3.9  CALCIUM  9.0 9.5      Recent Labs  Lab 01/02/24 0530 01/05/24 0554  CRP  --  16.1*  PROCALCITON  --  15.67  CALCIUM  9.0 9.5    --------------------------------------------------------------------------------------------------------------- Lab Results  Component Value Date   CHOL 166 04/16/2023   HDL 40 04/16/2023   LDLCALC 90 04/16/2023   TRIG 277 (H) 04/16/2023   CHOLHDL 4.2 04/16/2023    Lab Results  Component Value Date  HGBA1C 10.0 (A) 07/17/2023      Signature  -   Lavada Stank M.D on 01/07/2024 at 8:55 AM   -  To page go to [email or web address]

## 2024-01-07 NOTE — Progress Notes (Signed)
 Physical Therapy Treatment Patient Details Name: Alvin Daniels MRN: [id number] DOB: 06/04/1970 Today's Date: 01/07/2024   History of Present Illness Patient is a 53 y/o male admitted 12/14/23 with hip pain MRI showing R psoas abscess and L4-L5 discitis and osteomyelitis. 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  PMH includes ESRD on HD MWF, hepC, MRSA, HIV, bilat BKA, DMII.    PT Comments  Continuing work on functional mobility and activity tolerance;  Pt's prosthetist, Catarina Minus, [facility], was present, and initial goal of session was to check the fit of his prostheses, and get prosthetist's recommendations for any fit changes or prosthetic sock wear; Prynce was in too much pain to don his prostheses for a full fit check, but we were able to describe the fit to his prosthetist, who recommends sock wear for loose-fitting prostheses;   When pt dons prostheses, he clicks all the way down each pin on both R and L with ease -- this indicates a fit that is too loose, and it can lead to pressure on non pressure tolerant areas; Delaney tells us  that a well-fitting prosthesis should take a few steps to be able to click all the way down;   Next session we are able to walk with pt, we will use as many ply of prosthesis socks as needed to achieve above described better fit.    If plan is discharge home, recommend the following: A little help with walking and/or transfers;Help with stairs or ramp for entrance;Assist for transportation   Can travel by private vehicle        Equipment Recommendations  BSC/3in1 (drop-arm BSC)    Recommendations for Other Services Other (comment) (TOC -- Will need to know what other supports are possible for him at home)     Precautions / Restrictions Precautions Precautions: Fall Recall of Precautions/Restrictions: Impaired Precaution/Restrictions Comments: B BKA; prosthetics  in the room Restrictions Weight Bearing Restrictions Per Provider Order: No     Mobility  Bed Mobility Overal bed mobility: Modified Independent             General bed mobility comments: Moves well in the bed               Balance     Sitting balance-Leahy Scale: Good                                      Communication Communication Communication: No apparent difficulties;Other (comment) Factors Affecting Communication: Non - English speaking, interpreter not available Leona, Medical Spanish Interpreter, present and facilitated clear communication)  Cognition Arousal: Alert Behavior During Therapy: WFL for tasks assessed/performed, Impulsive (can sit without warning at times)                             Following commands: Intact      Cueing Cueing Techniques: Verbal cues, Visual cues  Exercises      General Comments        Pertinent Vitals/Pain Pain Assessment Faces Pain Scale: Hurts little more Pain Location: LLE at distal residual limb Pain Descriptors / Indicators: Discomfort, Sore    Home Living                          Prior Function  PT Goals (current goals can now be found in the care plan section) Acute Rehab PT Goals Patient Stated Goal: to improve pain to walk PT Goal Formulation: With patient Time For Goal Achievement: 01/18/24 Potential to Achieve Goals: Good Progress towards PT goals: Not progressing toward goals - comment (too much pain today)    Frequency    Min 2X/week      PT Plan      Co-evaluation              AM-PAC PT 6 Clicks Mobility   Outcome Measure  Help needed turning from your back to your side while in a flat bed without using bedrails?: None Help needed moving from lying on your back to sitting on the side of a flat bed without using bedrails?: None Help needed moving to and from a bed to a chair (including a wheelchair)?: A Little Help  needed standing up from a chair using your arms (e.g., wheelchair or bedside chair)?: A Little Help needed to walk in hospital room?: A Little Help needed climbing 3-5 steps with a railing? : Total 6 Click Score: 18    End of Session   Activity Tolerance: Patient tolerated treatment well Patient left: in bed;with call bell/phone within reach;with bed alarm set Nurse Communication: Mobility status PT Visit Diagnosis: Pain;Other abnormalities of gait and mobility (R26.89) Pain - Right/Left: Left Pain - part of body: Hip;Leg     Time: 1255-1311 PT Time Calculation (min) (ACUTE ONLY): 16 min  Charges:    $Orthotics/Prosthetics Check: 8-22 mins PT General Charges $$ ACUTE PT VISIT: 1 Visit                     Silvano Currier, PT  Acute Rehabilitation Services Office [phone] Secure Chat welcomed    Silvano VEAR Currier 01/07/2024, 6:01 PM

## 2024-01-07 NOTE — Progress Notes (Signed)
 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 Had continued vanc/cefepime  (no site for biopsy/sampling; presumed mrsa but unclear if other organism involved)  Micro: 8/4 bcx negative 6/2 bcx negative 5/29 finger abscess cx ecoli strep angi 5/28 bcx mrsa    Sed rate/hsCRP 8/25   104/16.1 8/07   122/18.7 8/04   133/15.2  Patient on 8/27 reports 2 days new sciatic pain and lower back pain more so left lower lumbar and tender on exam   Mri 8/25 reviewed with IR and does show worsening epidural phlegmon/abscess but relatively stable psoas  Crp/esr unchanged   My impression is a stagnant process --> ?vanc creep/resistance development vs a microbe not covered by vanc/cefepime   Ir to evaluate and do disc biopsy   HIV Controlled on biktarvy  Lab Results  Component Value Date   CD4TCELL 27 (L) 10/09/2023   CD4TABS 254 (L) 10/09/2023   Lab Results  Component Value Date   HIV1RNAQUANT <20 10/09/2023   Plan Continue vanc/cefepime  Appreciate IR evaluation F/u culture afb/fungal/bacterial if biopsy done Will see if adjustment abx needed Continue weekly cbc, cmp, sed rate, crp, vanc level Maintain contact isolation precaution Discussed with primary team   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)   Severe protein-calorie malnutrition ([name])   Normocytic anemia   ESRD on hemodialysis ([name])   S/P bilateral BKA (below knee amputation) ([name])   Chronic hepatitis C without hepatic coma ([name])   HIV (human immunodeficiency virus infection)  ([name])   Hx MRSA infection   Osteomyelitis of lumbar spine ([name])   Epidural abscess   Paraspinal abscess ([name])   Pyogenic inflammation of bone ([name])   No Known Allergies  Scheduled Meds:  acetaminophen   1,000 mg Oral TID   artificial tears   Right Eye QID   bictegravir-emtricitabine -tenofovir  AF  1 tablet Oral Daily   Chlorhexidine  Gluconate Cloth  6 each Topical Q0600   darbepoetin (ARANESP ) injection - DIALYSIS  100 mcg Subcutaneous Q Fri-1800   fentaNYL   1 patch Transdermal Q72H   gabapentin   100 mg Oral TID   gatifloxacin   1 drop Right Eye BID   heparin   5,000 Units Subcutaneous Q8H   ibuprofen   600 mg Oral TID WC   insulin  aspart  0-5 Units Subcutaneous QHS   insulin  aspart  0-6 Units Subcutaneous TID WC   lidocaine   2 patch Transdermal Q24H   methocarbamol   500 mg Oral TID   midodrine   10 mg Oral TID WC   [START ON 01/08/2024] midodrine   10 mg Oral Q T,Th,Sa-HD   pantoprazole   40 mg Oral Q1200   polyethylene glycol  17 g Oral Daily   sevelamer  carbonate  800 mg Oral TID WC   sodium chloride  flush  10-40 mL Intracatheter Q12H   Continuous Infusions:  ceFEPime  (MAXIPIME ) IV Stopped (01/06/24 1912)   vancomycin  Stopped (01/06/24 1452)   PRN Meds:.glucagon  (human recombinant), hydrALAZINE , ipratropium-albuterol , metoprolol  tartrate, ondansetron  (ZOFRAN ) IV, oxyCODONE , sodium chloride  flush   SUBJECTIVE: New 2 days lower back and shooting pain to phantom limbs  Review  of Systems: ROS All other ROS was negative, except mentioned above     OBJECTIVE: Vitals:   01/07/24 1610 01/07/24 1615 01/07/24 1630 01/07/24 1700  BP: (!) 146/88 (!) 146/98 130/87 137/89  Pulse: 90 90 88 89  Resp: 18 18 18 16   Temp:   (!) 97.4 F (36.3 C)   TempSrc:   Oral   SpO2: 100% 98% 100%   Weight:      Height:       Body mass index is 18.39 kg/m.  Physical Exam General/constitutional: no distress, pleasant HEENT: Normocephalic, PER, Conj Clear, EOMI, Oropharynx clear Neck  supple CV: rrr no mrg Lungs: clear to auscultation, normal respiratory effort Abd: Soft, Nontender Ext: no edema Skin: No Rash Neuro: nonfocal MSK: bilateral bka; tender left paralumbar area  Line: right internal jugular hd cath site no purulence   Lab Results Lab Results  Component Value Date   WBC 8.3 01/05/2024   HGB 9.8 (L) 01/05/2024   HCT 30.7 (L) 01/05/2024   MCV 100.3 (H) 01/05/2024   PLT 253 01/05/2024    Lab Results  Component Value Date   CREATININE 5.31 (H) 01/05/2024   BUN 28 (H) 01/05/2024   NA 130 (L) 01/05/2024   K 4.5 01/05/2024   CL 93 (L) 01/05/2024   CO2 22 01/05/2024    Lab Results  Component Value Date   ALT 9 12/15/2023   AST 19 12/15/2023   ALKPHOS 74 12/15/2023   BILITOT 1.8 (H) 12/15/2023      Microbiology: No results found for this or any previous visit (from the past 240 hours).   Serology:   Imaging: If present, new imagings (plain films, ct scans, and mri) have been personally visualized and interpreted; radiology reports have been reviewed. Decision making incorporated into the Impression / Recommendations.  8/25 mri lumbar spine 1. Persistent and ongoing changes of osteomyelitis discitis at L4-5. Associated epidural phlegmon/abscess within the ventral epidural space at L4-5 has progressed and worsened, with progressive severe spinal stenosis at this level. 2. Associated multiloculated bilateral psoas abscesses, measuring up to 3.1 cm on the left. 3. No other new or distant sites of infection elsewhere within the lumbar spine.  Alvin ONEIDA Passer, MD [facility] [phone] pager    01/07/2024, 6:46 PM

## 2024-01-07 NOTE — Plan of Care (Signed)
 Problem: Education: Goal: Knowledge of General Education information will improve Description: Including pain rating scale, medication(s)/side effects and non-pharmacologic comfort measures 01/07/2024 0346 by Jori Roderic CROME, RN Outcome: Progressing 01/07/2024 0346 by Jori Roderic CROME, RN Outcome: Progressing   Problem: Health Behavior/Discharge Planning: Goal: Ability to manage health-related needs will improve 01/07/2024 0346 by Jori Roderic CROME, RN Outcome: Progressing 01/07/2024 0346 by Jori Roderic CROME, RN Outcome: Progressing   Problem: Clinical Measurements: Goal: Ability to maintain clinical measurements within normal limits will improve 01/07/2024 0346 by Jori Roderic CROME, RN Outcome: Progressing 01/07/2024 0346 by Jori Roderic CROME, RN Outcome: Progressing Goal: Will remain free from infection 01/07/2024 0346 by Jori Roderic CROME, RN Outcome: Progressing 01/07/2024 0346 by Jori Roderic CROME, RN Outcome: Progressing Goal: Diagnostic test results will improve 01/07/2024 0346 by Jori Roderic CROME, RN Outcome: Progressing 01/07/2024 0346 by Jori Roderic CROME, RN Outcome: Progressing Goal: Respiratory complications will improve 01/07/2024 0346 by Jori Roderic CROME, RN Outcome: Progressing 01/07/2024 0346 by Jori Roderic CROME, RN Outcome: Progressing Goal: Cardiovascular complication will be avoided 01/07/2024 0346 by Jori Roderic CROME, RN Outcome: Progressing 01/07/2024 0346 by Jori Roderic CROME, RN Outcome: Progressing   Problem: Activity: Goal: Risk for activity intolerance will decrease 01/07/2024 0346 by Jori Roderic CROME, RN Outcome: Progressing 01/07/2024 0346 by Jori Roderic CROME, RN Outcome: Progressing   Problem: Nutrition: Goal: Adequate nutrition will be maintained 01/07/2024 0346 by Jori Roderic CROME, RN Outcome: Progressing 01/07/2024 0346 by Jori Roderic CROME, RN Outcome: Progressing   Problem:  Coping: Goal: Level of anxiety will decrease 01/07/2024 0346 by Jori Roderic CROME, RN Outcome: Progressing 01/07/2024 0346 by Jori Roderic CROME, RN Outcome: Progressing   Problem: Elimination: Goal: Will not experience complications related to bowel motility 01/07/2024 0346 by Jori Roderic CROME, RN Outcome: Progressing 01/07/2024 0346 by Jori Roderic CROME, RN Outcome: Progressing Goal: Will not experience complications related to urinary retention 01/07/2024 0346 by Jori Roderic CROME, RN Outcome: Progressing 01/07/2024 0346 by Jori Roderic CROME, RN Outcome: Progressing   Problem: Pain Managment: Goal: General experience of comfort will improve and/or be controlled 01/07/2024 0346 by Jori Roderic CROME, RN Outcome: Progressing 01/07/2024 0346 by Jori Roderic CROME, RN Outcome: Progressing   Problem: Safety: Goal: Ability to remain free from injury will improve 01/07/2024 0346 by Jori Roderic CROME, RN Outcome: Progressing 01/07/2024 0346 by Jori Roderic CROME, RN Outcome: Progressing   Problem: Skin Integrity: Goal: Risk for impaired skin integrity will decrease 01/07/2024 0346 by Jori Roderic CROME, RN Outcome: Progressing 01/07/2024 0346 by Jori Roderic CROME, RN Outcome: Progressing   Problem: Education: Goal: Ability to describe self-care measures that may prevent or decrease complications (Diabetes Survival Skills Education) will improve 01/07/2024 0346 by Jori Roderic CROME, RN Outcome: Progressing 01/07/2024 0346 by Jori Roderic CROME, RN Outcome: Progressing Goal: Individualized Educational Video(s) 01/07/2024 0346 by Jori Roderic CROME, RN Outcome: Progressing 01/07/2024 0346 by Jori Roderic CROME, RN Outcome: Progressing   Problem: Coping: Goal: Ability to adjust to condition or change in health will improve 01/07/2024 0346 by Jori Roderic CROME, RN Outcome: Progressing 01/07/2024 0346 by Jori Roderic CROME, RN Outcome:  Progressing   Problem: Fluid Volume: Goal: Ability to maintain a balanced intake and output will improve 01/07/2024 0346 by Jori Roderic CROME, RN Outcome: Progressing 01/07/2024 0346 by Jori Roderic CROME, RN Outcome: Progressing   Problem: Health Behavior/Discharge Planning: Goal: Ability to identify and utilize available resources and services will improve 01/07/2024 0346 by Jori Roderic CROME, RN Outcome: Progressing 01/07/2024 0346 by  Jori Roderic CROME, RN Outcome: Progressing Goal: Ability to manage health-related needs will improve 01/07/2024 0346 by Jori Roderic CROME, RN Outcome: Progressing 01/07/2024 0346 by Jori Roderic CROME, RN Outcome: Progressing   Problem: Metabolic: Goal: Ability to maintain appropriate glucose levels will improve 01/07/2024 0346 by Jori Roderic CROME, RN Outcome: Progressing 01/07/2024 0346 by Jori Roderic CROME, RN Outcome: Progressing   Problem: Nutritional: Goal: Maintenance of adequate nutrition will improve 01/07/2024 0346 by Jori Roderic CROME, RN Outcome: Progressing 01/07/2024 0346 by Jori Roderic CROME, RN Outcome: Progressing Goal: Progress toward achieving an optimal weight will improve 01/07/2024 0346 by Jori Roderic CROME, RN Outcome: Progressing 01/07/2024 0346 by Jori Roderic CROME, RN Outcome: Progressing   Problem: Skin Integrity: Goal: Risk for impaired skin integrity will decrease 01/07/2024 0346 by Jori Roderic CROME, RN Outcome: Progressing 01/07/2024 0346 by Jori Roderic CROME, RN Outcome: Progressing   Problem: Tissue Perfusion: Goal: Adequacy of tissue perfusion will improve 01/07/2024 0346 by Jori Roderic CROME, RN Outcome: Progressing 01/07/2024 0346 by Jori Roderic CROME, RN Outcome: Progressing

## 2024-01-07 NOTE — Progress Notes (Signed)
 [facility] Progress Note   Dialysis Orders: NW-MWF 3.5h  B400  45.5kg  TDC  Heparin  2000 Mircera 100 mcg - last dose 12/01/23 Calcitriol  1.0 mcg (on hold due to high Ca)   Home bp meds: Midodrine  10mg  tid prn for SBP < 100 Midodrine  10mg  pre HD mwf prn   Assessment/Plan: 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 Not a surgical candidate. Blood cxs ng. Plan for IV Cefepime  2g and IV Vancomycin  500mg  with HD X 8 weeks end date 02/09/24. Pain management per primary team.   ESRD: Usual HD MWF.  Off schedule but will continue TTS schedule for now while he is in the hospital.  Tolerated dialysis on Tuesday and kept him net even as he appears to be euvolemic.  Appetite is very poor; BP low on dialysis.    BP: chronic low BP on midodrine  10 tid prn and 10mg  pre HD mwf as needed for SBP < 100. Can give albumin  during HD for low BP.  Volume:  UF as tolerated. Has lost weight. Now under EDW. Will need EDW changed at d/c.  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  No IV iron  due to infection.  HIV: controlled on Biktarvy  BMD:  Continue sevelamer  TID WC. VDRA on hold. Continue to trend Ca; P3.9 8/25 Nutrition - renal diet w/fluid restrictions. Debility: PT has requested that we schedule him for 2nd shift whenever we can.   Subjective:  Good spirits this morning; he thinks he is getting better especially lower extremities. No issues with dialysis.  Denies fever, chills, shortness of breath, chest pain.  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 (left BCF was placed 05/10/2016)  Filed Weights   01/03/24 1240 01/06/24 0731 01/06/24 1145  Weight: 38.9 kg 40.8 kg 41.3 kg    Intake/Output Summary (Last 24 hours) at 01/07/2024 0741 Last data filed at 01/07/2024 0347 Gross per 24 hour  Intake 200 ml  Output 0 ml  Net 200 ml    Additional Objective Labs: Basic Metabolic Panel: Recent Labs  Lab 01/02/24 0530 01/05/24 0554  NA 130* 130*  K 3.5 4.5  CL 92* 93*  CO2 24 22  GLUCOSE 173* 158*  BUN 17 28*  CREATININE 3.89* 5.31*  CALCIUM  9.0 9.5  PHOS 3.5 3.9    CBC: Recent Labs  Lab 01/02/24 0530 01/05/24 0554  WBC 5.8 8.3  NEUTROABS  --  5.8  HGB 8.4* 9.8*  HCT 25.8* 30.7*  MCV 99.2 100.3*  PLT 234 253    Medications:  ceFEPime  (MAXIPIME ) IV Stopped (01/06/24 1912)   vancomycin  Stopped (01/06/24 1452)    acetaminophen   1,000 mg Oral TID   artificial tears   Right Eye QID   bictegravir-emtricitabine -tenofovir  AF  1 tablet Oral Daily   Chlorhexidine  Gluconate Cloth  6 each Topical Q0600   darbepoetin (ARANESP ) injection - DIALYSIS  100  mcg Subcutaneous Q Fri-1800   fentaNYL   1 patch Transdermal Q72H   gabapentin   100 mg Oral TID   gatifloxacin   1 drop Right Eye BID   heparin   5,000 Units Subcutaneous Q8H   ibuprofen   600 mg Oral TID WC   insulin  aspart  0-5 Units Subcutaneous QHS   insulin  aspart  0-6 Units Subcutaneous TID WC   lidocaine   2 patch Transdermal Q24H   methocarbamol   500 mg Oral TID   midodrine   10 mg Oral TID WC   pantoprazole   40 mg Oral Q1200   polyethylene glycol  17 g Oral Daily   sevelamer  carbonate  800 mg Oral TID WC   sodium chloride  flush  10-40 mL Intracatheter Q12H

## 2024-01-07 NOTE — Progress Notes (Signed)
 MEDICATION-RELATED CONSULT NOTE   IR Procedure Consult - Anticoagulant/Antiplatelet PTA/Inpatient Med List Review by Pharmacist    Procedure: L4/5 disc aspiration    Completed: 01/07/24 at 16:10  Post-Procedural bleeding risk per IR MD assessment:  standard  Antithrombotic medications on inpatient or PTA profile prior to procedure:   Heparin  5000 units SQ q8h    Recommended restart time per IR Post-Procedure Guidelines:   Day 0  (at least 6 hours or at next standard dose interval)    Other considerations:      Plan:    resume SQ heparin  at 22:00 as previously scheduled   Rocky Slade, PharmD, BCPS 01/07/2024 4:36 PM

## 2024-01-08 LAB — RENAL FUNCTION PANEL
Albumin: 2.5 g/dL — ABNORMAL LOW (ref 3.5–5.0)
Anion gap: 17 — ABNORMAL HIGH (ref 5–15)
BUN: 33 mg/dL — ABNORMAL HIGH (ref 6–20)
CO2: 21 mmol/L — ABNORMAL LOW (ref 22–32)
Calcium: 9.4 mg/dL (ref 8.9–10.3)
Chloride: 93 mmol/L — ABNORMAL LOW (ref 98–111)
Creatinine, Ser: 5.53 mg/dL — ABNORMAL HIGH (ref 0.61–1.24)
GFR, Estimated: 12 mL/min — ABNORMAL LOW (ref >60)
Glucose, Bld: 104 mg/dL — ABNORMAL HIGH (ref 70–99)
Phosphorus: 4 mg/dL (ref 2.5–4.6)
Potassium: 4.8 mmol/L (ref 3.5–5.1)
Sodium: 131 mmol/L — ABNORMAL LOW (ref 135–145)

## 2024-01-08 LAB — CBC
HCT: 26.7 % — ABNORMAL LOW (ref 39.0–52.0)
Hemoglobin: 8.5 g/dL — ABNORMAL LOW (ref 13.0–17.0)
MCH: 31.4 pg (ref 26.0–34.0)
MCHC: 31.8 g/dL (ref 30.0–36.0)
MCV: 98.5 fL (ref 80.0–100.0)
Platelets: 266 K/uL (ref 150–400)
RBC: 2.71 MIL/uL — ABNORMAL LOW (ref 4.22–5.81)
RDW: 16.5 % — ABNORMAL HIGH (ref 11.5–15.5)
WBC: 7.8 K/uL (ref 4.0–10.5)
nRBC: 0 % (ref 0.0–0.2)

## 2024-01-08 LAB — GLUCOSE, CAPILLARY
Glucose-Capillary: 114 mg/dL — ABNORMAL HIGH (ref 70–99)
Glucose-Capillary: 117 mg/dL — ABNORMAL HIGH (ref 70–99)

## 2024-01-08 MED ORDER — ALBUMIN HUMAN 25 % IV SOLN
25.0000 g | Freq: Once | INTRAVENOUS | Status: AC
  Administered 2024-01-08: 25 g via INTRAVENOUS

## 2024-01-08 MED ORDER — ALBUMIN HUMAN 25 % IV SOLN
INTRAVENOUS | Status: AC
Start: 2024-01-08 — End: 2024-01-08
  Filled 2024-01-08: qty 100

## 2024-01-08 MED ORDER — HEPARIN SODIUM (PORCINE) 1000 UNIT/ML IJ SOLN
INTRAMUSCULAR | Status: AC
  Filled 2024-01-08: qty 4

## 2024-01-08 MED ORDER — MIDODRINE HCL 5 MG PO TABS
ORAL_TABLET | ORAL | Status: AC
  Filled 2024-01-08: qty 2

## 2024-01-08 MED ORDER — HEPARIN SODIUM (PORCINE) 1000 UNIT/ML IJ SOLN
3800.0000 [IU] | Freq: Once | INTRAMUSCULAR | Status: AC
  Administered 2024-01-08: 3800 [IU] via INTRAVENOUS

## 2024-01-08 NOTE — Procedures (Signed)
 Received patient in bed to unit.  Alert and oriented.  Informed consent signed and in chart.   TX duration:3 hours  Patient was hypotensive during  tx, responded well to interventions. Transported back to the room  Alert, without acute distress.  Hand-off given to patient's nurse.   Access used: R internal jugular dual lumen catheter Access issues: No  Total UF removed: 0 Medication(s) given: See MAR  See flow sheet.   Powell LITTIE [facility]

## 2024-01-08 NOTE — Progress Notes (Signed)
 [facility] Progress Note   Dialysis Orders: NW-MWF 3.5h  B400  45.5kg  TDC  Heparin  2000 Mircera 100 mcg - last dose 12/01/23 Calcitriol  1.0 mcg (on hold due to high Ca)   Home bp meds: Midodrine  10mg  tid prn for SBP < 100 Midodrine  10mg  pre HD mwf prn   Assessment/Plan: 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 Not a surgical candidate. Blood cxs ng. Plan for IV Cefepime  2g and IV Vancomycin  500mg  with HD X 8 weeks end date 02/09/24. Pain management per primary team.   ESRD: Usual HD MWF.  Off schedule but will continue TTS schedule for now while he is in the hospital.  Tolerated dialysis on Tuesday and kept him net even as he appears to be euvolemic.  Appetite is very poor; BP low on dialysis.    For dialysis today but will defer to disc biopsy by IR as that has higher priority.  BP: chronic low BP on midodrine  10 tid prn and 10mg  pre HD mwf as needed for SBP < 100. Can give albumin  during HD for low BP.  Volume:  UF as tolerated. Has lost weight. Now under EDW. Will need EDW changed at d/c.  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  No IV iron  due to infection.  HIV: controlled on Biktarvy  BMD:  Continue sevelamer  TID WC. VDRA on hold. Continue to trend Ca; P3.9 8/25 Nutrition - renal diet w/fluid restrictions. Debility: PT has requested that we schedule him for 2nd shift whenever we can.   Subjective:  Good spirits this morning; he thinks he is getting better especially lower extremities. No issues with dialysis.  Denies fever, chills, shortness of breath, chest pain.  He states that the back pain is a little better this morning.  Objective Vitals:   01/07/24 1943 01/07/24 2359 01/08/24 0510 01/08/24 0839  BP: 129/89 115/76 104/76   Pulse: 90 82 74   Resp: 12 12 16 15   Temp: 97.6 F (36.4 C) 97.6 F (36.4 C) 97.6 F (36.4 C) 98.9 F (37.2 C)  TempSrc: Oral Oral Oral Oral  SpO2: 96% 100% 100%   Weight:      Height:        Physical Exam General: Alert, nad  Heart: RRR  Lungs: nml WOB on RA Abdomen: soft, ND Extremities: b/l BKA, no edema Dialysis Access: Right IJ TDC, thrombosed left arm aneurysmal brachiocephalic fistula which is pulsatile inflow (left BCF was placed 05/10/2016)  Filed Weights   01/03/24 1240 01/06/24 0731 01/06/24 1145  Weight: 38.9 kg 40.8 kg 41.3 kg   No intake or output data in the 24 hours ending 01/08/24 0915   Additional Objective Labs: Basic Metabolic Panel: Recent Labs  Lab 01/02/24 0530 01/05/24 0554  NA 130* 130*  K 3.5 4.5  CL 92* 93*  CO2 24 22  GLUCOSE 173* 158*  BUN 17 28*  CREATININE 3.89* 5.31*  CALCIUM  9.0 9.5  PHOS 3.5 3.9    CBC: Recent Labs  Lab 01/02/24 0530 01/05/24 0554  WBC 5.8 8.3  NEUTROABS  --  5.8  HGB 8.4* 9.8*  HCT 25.8* 30.7*  MCV 99.2 100.3*  PLT 234 253    Medications:  ceFEPime  (MAXIPIME ) IV Stopped (01/06/24 1912)   vancomycin  Stopped (01/06/24 1452)    acetaminophen   1,000 mg Oral TID   artificial tears   Right Eye QID   bictegravir-emtricitabine -tenofovir  AF  1 tablet Oral Daily   Chlorhexidine  Gluconate Cloth  6 each Topical Q0600   darbepoetin (ARANESP ) injection - DIALYSIS  100 mcg Subcutaneous Q Fri-1800   fentaNYL   1 patch Transdermal Q72H   gabapentin   100 mg Oral TID   gatifloxacin   1 drop Right Eye BID   heparin   5,000 Units Subcutaneous Q8H   ibuprofen   600 mg Oral TID WC   insulin  aspart  0-5 Units Subcutaneous QHS   insulin  aspart  0-6 Units Subcutaneous TID WC   lidocaine   2 patch Transdermal Q24H   methocarbamol   500 mg Oral TID   midodrine   10 mg Oral TID WC   midodrine   10 mg Oral Q T,Th,Sa-HD   pantoprazole   40 mg Oral Q1200   polyethylene glycol  17 g Oral Daily   sevelamer  carbonate  800 mg Oral TID WC   sodium chloride  flush  10-40 mL Intracatheter Q12H

## 2024-01-08 NOTE — Progress Notes (Addendum)
 PROGRESS NOTE        PATIENT DETAILS Name: Alvin Daniels Age: 53 y.o. Sex: male Date of Birth: 07-15-1970 Admit Date: 12/14/2023 Admitting Physician Marsha Ada, MD PCP:Pcp, No  Brief Summary: Patient is a 53 y.o.  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  Significant events: 8/4>> admit to [facility]  Significant studies: 6/4>> TEE: EF 60-65%, 1.8 x 1.2 mass right atrial wall-?vegetation from old HD catheter impinged on RA wall-versus mass/tumor. 8/4>> MRI pelvis: Discitis/osteomyelitis L4-5-right psoas abscess. 8/5>> CT abdomen/pelvis: Discitis/osteomyelitis L4-5 with paraspinal inflammatory changes/right psoas abscess.  8/8>> MRI LS spine: L4-5 discitis/osteomyelitis with associated paraspinal inflammatory changes, right psoas abscess. 8/20>> MRI left hip: Discitis of the lower lumbar spine with extension into the psoas muscle. 8/20>> BKA-no significant abnormality.  Significant microbiology data: 5/28>> blood culture: MRSA 5/29>> left finger abscess: E. coli/Streptococcus anginosus, Bacteroides fragilis. 8/4>> blood culture: No growth  Procedures: None.  Consults: ID Nephrology Ophthalmology  Subjective: Patient in bed, appears comfortable, denies any headache, no fever, no chest pain or pressure, no shortness of breath , no abdominal pain. No focal weakness.    Objective: Vitals: Blood pressure 104/76, pulse 74, temperature 97.6 F (36.4 C), temperature source Oral, resp. rate 16, height 4' 11 (1.499 m), weight 41.3 kg, SpO2 100%.   Exam:  Awake Alert, No new F.N deficits, Normal affect [place].AT,PERRAL Supple Neck, No JVD,   Symmetrical Chest wall movement, Good air  movement bilaterally, CTAB RRR,No Gallops, Rubs or new Murmurs,  +ve B.Sounds, Abd Soft, No tenderness,   Bilateral BKA, right IJ HD catheter   Assessment/Plan:  L4-L5 discitis with surrounding paraspinal inflammatory change and right psoas abscess Recent history of MRSA bacteremia/left fourth finger osteomyelitis May 2025 (strep/Bacteroides/E. coli on bone culture) Likely MRSA-although cultures this admission negative.   Evaluated by ID-recommendations are for vancomycin /cefepime  x 8 weeks-EOT 9/29 Reviewed prior notes-not a surgical candidate-IR unable to drain due to lack of safe window. Pain is in better control Repeat MRI on 01/05/2024 noted with progression of disease, ID following they have recommended IR to do a biopsy for guided sensitivity and culture, disc biopsy by IR Dr. Johann on 01/07/2024.  Monitor results.  Syncope on 8/20 Probably related to soft blood pressure/orthostatic hypotension Telemetry monitoring unremarkable.  Chronic hypotension On midodrine  Narcotics for back pain very not helping. Minimize narcotics as much as possible-avoid further escalation in narcotic dosing.  ESRD on HD MWF Nephrology following.  Normocytic anemia Secondary to ESRD Hb stable Aranesp /iron  defer to nephrology service Follow  PAD-s/p bilateral BKA Normally uses prosthesis Due to severe pain-he has been unable to ambulate with a prosthesis.  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  Difficult disposition No family-uninsured-SNF not a option per SW-history of ESRD with bilateral BKA-currently unable to ambulate with his prostheses-hence unable to be  discharged home safely.   Normally-prior to this hospitalization was able to ambulate with  prosthesis and catch the bus/scat transport to go back and forth from dialysis center.   DM-2 (A1c 10.0 on 3/6) CBG stable SSI  Recent Labs    01/07/24 1227 01/07/24 1655 01/07/24 2137  GLUCAP 120* 117* 118*        Code status:   Code Status: Full Code   DVT Prophylaxis: heparin  injection 5,000 Units Start: 12/15/23 1400   Family Communication: None at bedside   Disposition Plan: Status is: Inpatient Remains inpatient appropriate because: Severe of illness   Planned Discharge Destination:Home health   Diet: Diet Order             Diet NPO time specified Except for: Sips with Meds  Diet effective midnight                   I have personally reviewed following labs and imaging studies  LABORATORY DATA:   Data Review:   Inpatient Medications  Scheduled Meds:  acetaminophen   1,000 mg Oral TID   artificial tears   Right Eye QID   bictegravir-emtricitabine -tenofovir  AF  1 tablet Oral Daily   Chlorhexidine  Gluconate Cloth  6 each Topical Q0600   darbepoetin (ARANESP ) injection - DIALYSIS  100 mcg Subcutaneous Q Fri-1800   fentaNYL   1 patch Transdermal Q72H   gabapentin   100 mg Oral TID   gatifloxacin   1 drop Right Eye BID   heparin   5,000 Units Subcutaneous Q8H   ibuprofen   600 mg Oral TID WC   insulin  aspart  0-5 Units Subcutaneous QHS   insulin  aspart  0-6 Units Subcutaneous TID WC   lidocaine   2 patch Transdermal Q24H   methocarbamol   500 mg Oral TID   midodrine   10 mg Oral TID WC   midodrine   10 mg Oral Q T,Th,Sa-HD   pantoprazole   40 mg Oral Q1200   polyethylene glycol  17 g Oral Daily   sevelamer  carbonate  800 mg Oral TID WC   sodium chloride  flush  10-40 mL Intracatheter Q12H   Continuous Infusions:  ceFEPime  (MAXIPIME ) IV Stopped (01/06/24 1912)   vancomycin  Stopped (01/06/24 1452)   PRN Meds:.glucagon  (human recombinant), hydrALAZINE ,  ipratropium-albuterol , metoprolol  tartrate, ondansetron  (ZOFRAN ) IV, oxyCODONE , sodium chloride  flush  DVT Prophylaxis  heparin  injection 5,000 Units Start: 12/15/23 1400     Recent Labs  Lab 01/02/24 0530 01/05/24 0554  WBC 5.8 8.3  HGB 8.4* 9.8*  HCT 25.8* 30.7*  PLT 234 253  MCV 99.2 100.3*  MCH 32.3 32.0  MCHC 32.6 31.9  RDW 15.7* 16.1*  LYMPHSABS  --  1.6  MONOABS  --  0.7  EOSABS  --  0.1  BASOSABS  --  0.1    Recent Labs  Lab 01/02/24 0530 01/05/24 0554 01/07/24 1226  NA 130* 130*  --   K 3.5 4.5  --   CL 92* 93*  --   CO2 24 22  --   ANIONGAP 14 15  --   GLUCOSE 173* 158*  --   BUN 17 28*  --   CREATININE 3.89* 5.31*  --   ALBUMIN  2.6*  --   --   CRP  --  16.1*  --   PROCALCITON  --  15.67  --   INR  --   --  1.1  PHOS 3.5 3.9  --   CALCIUM  9.0 9.5  --       Recent Labs  Lab 01/02/24 0530 01/05/24 0554 01/07/24 1226  CRP  --  16.1*  --  PROCALCITON  --  15.67  --   INR  --   --  1.1  CALCIUM  9.0 9.5  --     --------------------------------------------------------------------------------------------------------------- Lab Results  Component Value Date   CHOL 166 04/16/2023   HDL 40 04/16/2023   LDLCALC 90 04/16/2023   TRIG 277 (H) 04/16/2023   CHOLHDL 4.2 04/16/2023    Lab Results  Component Value Date   HGBA1C 10.0 (A) 07/17/2023      Signature  -   Lavada Stank M.D on 01/08/2024 at 7:39 AM   -  To page go to [email or web address]

## 2024-01-08 NOTE — Progress Notes (Signed)
 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 Had continued vanc/cefepime  (no site for biopsy/sampling; presumed mrsa but unclear if other organism involved)  Micro: 8/4 bcx negative 6/2 bcx negative 5/29 finger abscess cx ecoli strep angi 5/28 bcx mrsa    Sed rate/hsCRP 8/25   104/16.1 8/07   122/18.7 8/04   133/15.2  Patient on 8/27 reports 2 days new sciatic pain and lower back pain more so left lower lumbar and tender on exam   Mri 8/25 reviewed with IR and does show worsening epidural phlegmon/abscess but relatively stable psoas  Crp/esr unchanged   My impression is a stagnant process --> ?vanc creep/resistance development vs a microbe not covered by vanc/cefepime   Ir to evaluate and do disc biopsy   HIV Controlled on biktarvy  Lab Results  Component Value Date   CD4TCELL 27 (L) 10/09/2023   CD4TABS 254 (L) 10/09/2023   Lab Results  Component Value Date   HIV1RNAQUANT <20 10/09/2023      ---------------- 8/28 id assessment L4 biopsy by ir culture gpc  I do not have suspicion for gram negative; and I agree with dr Fleeta dam that this is likely all mrsa  Suspect will need a longer course; pending culture to see if vancomycin  sensitivity an issue  Plan Continue vanc Stop cefepime  Plan at least 6 more weeks starting 8/27 for vancomycin  with dialysis F/u culture l4 biopsy Continue weekly cbc, cmp, sed rate, crp, vanc level Maintain contact isolation precaution Discussed with primary team   Principal Problem:   Psoas abscess ([name]) Active Problems:   Diabetes mellitus with ESRD (end-stage  renal disease) ([name])   Severe protein-calorie malnutrition ([name])   Normocytic anemia   ESRD on hemodialysis ([name])   S/P bilateral BKA (below knee amputation) ([name])   Chronic hepatitis C without hepatic coma ([name])   HIV (human immunodeficiency virus infection) ([name])   Hx MRSA infection   Acute osteomyelitis of lumbar spine ([name])   Epidural abscess   Paraspinal abscess ([name])   Pyogenic inflammation of bone ([name])   No Known Allergies  Scheduled Meds:  acetaminophen   1,000 mg Oral TID   artificial tears   Right Eye QID   bictegravir-emtricitabine -tenofovir  AF  1 tablet Oral Daily   Chlorhexidine  Gluconate Cloth  6 each Topical Q0600   darbepoetin (ARANESP ) injection - DIALYSIS  100 mcg Subcutaneous Q Fri-1800   fentaNYL   1 patch Transdermal Q72H   gabapentin   100 mg Oral TID   gatifloxacin   1 drop Right Eye BID   heparin   5,000 Units Subcutaneous Q8H   ibuprofen   600 mg Oral TID WC   insulin  aspart  0-5 Units Subcutaneous QHS   insulin  aspart  0-6 Units Subcutaneous TID WC   lidocaine   2 patch Transdermal Q24H   methocarbamol   500 mg Oral TID   midodrine   10 mg Oral TID WC   midodrine   10 mg Oral Q T,Th,Sa-HD   pantoprazole   40 mg Oral Q1200   polyethylene glycol  17 g Oral Daily   sevelamer  carbonate  800 mg Oral TID WC   sodium chloride  flush  10-40  mL Intracatheter Q12H   Continuous Infusions:  vancomycin  500 mg (01/08/24 1611)   PRN Meds:.glucagon  (human recombinant), hydrALAZINE , ipratropium-albuterol , metoprolol  tartrate, ondansetron  (ZOFRAN ) IV, oxyCODONE , sodium chloride  flush   SUBJECTIVE: No complaint Cx growing gpc afebrile  Review of Systems: ROS All other ROS was negative, except mentioned above     OBJECTIVE: Vitals:   01/08/24 1230 01/08/24 1300 01/08/24 1315 01/08/24 1332  BP: 104/80 115/78 116/78 102/74  Pulse: 87 86 87 99  Resp: 11 11 20 12   Temp:    (!) 97.5 F (36.4 C)  TempSrc:      SpO2: 100% 100% 100% 100%  Weight:      Height:        Body mass index is 17.32 kg/m.  Physical Exam General/constitutional: no distress, pleasant HEENT: Normocephalic, PER, Conj Clear, EOMI, Oropharynx clear Neck supple CV: rrr no mrg Lungs: clear to auscultation, normal respiratory effort Abd: Soft, Nontender Ext: no edema Skin: No Rash Neuro: nonfocal MSK: bilateral bka; tender left paralumbar area  Line: right internal jugular hd cath site no purulence   Lab Results Lab Results  Component Value Date   WBC 7.8 01/08/2024   HGB 8.5 (L) 01/08/2024   HCT 26.7 (L) 01/08/2024   MCV 98.5 01/08/2024   PLT 266 01/08/2024    Lab Results  Component Value Date   CREATININE 5.53 (H) 01/08/2024   BUN 33 (H) 01/08/2024   NA 131 (L) 01/08/2024   K 4.8 01/08/2024   CL 93 (L) 01/08/2024   CO2 21 (L) 01/08/2024    Lab Results  Component Value Date   ALT 9 12/15/2023   AST 19 12/15/2023   ALKPHOS 74 12/15/2023   BILITOT 1.8 (H) 12/15/2023      Microbiology: Recent Results (from the past 240 hours)  Aerobic/Anaerobic Culture w Gram Stain (surgical/deep wound)     Status: None (Preliminary result)   Collection Time: 01/07/24  4:12 PM   Specimen: Fine Needle Aspirate  Result Value Ref Range Status   Specimen Description NEEDLE ASPIRATE  Final   Special Requests DISC  Final   Gram Stain   Final    ABUNDANT WBC PRESENT, PREDOMINANTLY PMN RARE GRAM POSITIVE COCCI    Culture   Final    NO GROWTH < 12 HOURS Performed at [facility], [address] [place], [place] [address]    Report Status PENDING  Incomplete     Serology:   Imaging: If present, new imagings (plain films, ct scans, and mri) have been personally visualized and interpreted; radiology reports have been reviewed. Decision making incorporated into the Impression / Recommendations.  8/25 mri lumbar spine 1. Persistent and ongoing changes of osteomyelitis discitis at L4-5. Associated epidural phlegmon/abscess within the ventral epidural space at  L4-5 has progressed and worsened, with progressive severe spinal stenosis at this level. 2. Associated multiloculated bilateral psoas abscesses, measuring up to 3.1 cm on the left. 3. No other new or distant sites of infection elsewhere within the lumbar spine.  Constance ONEIDA Passer, MD [facility] [phone] pager    01/08/2024, 7:57 PM

## 2024-01-08 NOTE — Progress Notes (Signed)
 Occupational Therapy Treatment Patient Details Name: Alvin Daniels MRN: [id number] DOB: 1970/12/24 Today's Date: 01/08/2024   History of present illness Patient is a 53 y/o male admitted 12/14/23 with hip pain MRI showing R psoas abscess and L4-L5 discitis and osteomyelitis. 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  PMH includes ESRD on HD MWF, hepC, MRSA, HIV, bilat BKA, DMII.   OT comments  Patient received in supine and agreeable to OT treatment. Patient able to get to EOB without assistance and maintained good sitting balance. Patient agreeable to grooming tasks but declined bathing or addressing BSC transfers. Patient was provided printed HEP for UE strengthening in Spanish. Patient performed exercises from HEP with verbal cues with level 2 therapy band. Discharge recommendations continues to be appropriate.  Acute OT to continue to follow to address established goals to facilitate DC to next venue of care.        If plan is discharge home, recommend the following:  Assistance with cooking/housework;A little help with bathing/dressing/bathroom   Equipment Recommendations  None recommended by OT (Pt declined additional DME)    Recommendations for Other Services      Precautions / Restrictions Precautions Precautions: Fall Recall of Precautions/Restrictions: Impaired Precaution/Restrictions Comments: B BKA; prosthetics in the room Restrictions Weight Bearing Restrictions Per Provider Order: No       Mobility Bed Mobility Overal bed mobility: Modified Independent             General bed mobility comments: able to get to EOB without assistance and remained on EOB at session    Transfers Overall transfer level: Needs assistance                 General transfer comment: declined transfers     Balance Overall balance assessment: Needs assistance Sitting-balance support: Feet  unsupported, Bilateral upper extremity supported Sitting balance-Leahy Scale: Good Sitting balance - Comments: able to perform grooming and UE HEP tasks seated on EOB without LOB                                   ADL either performed or assessed with clinical judgement   ADL Overall ADL's : Needs assistance/impaired     Grooming: Wash/dry hands;Wash/dry face;Set up;Sitting Grooming Details (indicate cue type and reason): on EOB                               General ADL Comments: Patient agreeable to grooming seated on EOB only, declined all other ADL tasks and toilet transfer training    Extremity/Trunk Assessment Upper Extremity Assessment Upper Extremity Assessment: Generalized weakness            Vision       Perception     Praxis     Communication Communication Communication: No apparent difficulties;Other (comment) Factors Affecting Communication: Non - English speaking, interpreter not available (Video interpretor Maximiliano [phone] utilized for interpreting)   Cognition Arousal: Alert Behavior During Therapy: [facility] for tasks assessed/performed Cognition: No apparent impairments                               Following commands: Intact        Cueing   Cueing Techniques: Verbal cues, Visual cues  Exercises Exercises: General Upper Extremity General Exercises -  Upper Extremity Shoulder Flexion: Strengthening, Both, 15 reps, Seated, Theraband Theraband Level (Shoulder Flexion): Level 2 (Red) Shoulder Horizontal ABduction: Strengthening, Both, 15 reps, Seated, Theraband Theraband Level (Shoulder Horizontal Abduction): Level 2 (Red) Elbow Flexion: Strengthening, Both, 15 reps, Seated, Theraband Theraband Level (Elbow Flexion): Level 2 (Red) Elbow Extension: Strengthening, Both, 15 reps, Seated, Theraband Theraband Level (Elbow Extension): Level 2 (Red)    Shoulder Instructions       General Comments UE HEP provided  in spanish    Pertinent Vitals/ Pain       Pain Assessment Pain Assessment: Faces Faces Pain Scale: Hurts little more Pain Location: LLE at distal residual limb Pain Descriptors / Indicators: Discomfort, Sore Pain Intervention(s): Limited activity within patient's tolerance, Monitored during session  Home Living                                          Prior Functioning/Environment              Frequency  Min 2X/week        Progress Toward Goals  OT Goals(current goals can now be found in the care plan section)  Progress towards OT goals: Progressing toward goals  Acute Rehab OT Goals Patient Stated Goal: none stated OT Goal Formulation: With patient Time For Goal Achievement: 01/21/24 Potential to Achieve Goals: Good ADL Goals Pt Will Perform Lower Body Dressing: with modified independence;sit to/from stand Pt Will Transfer to Toilet: with modified independence;ambulating Pt Will Perform Tub/Shower Transfer: Shower transfer;with modified independence;shower seat;ambulating Pt/caregiver will Perform Home Exercise Program: With theraband;Right Upper extremity;Left upper extremity;Independently;With written HEP provided (For L shoulder stability and R sided strengthening)  Plan      Co-evaluation                 AM-PAC OT 6 Clicks Daily Activity     Outcome Measure   Help from another person eating meals?: A Little Help from another person taking care of personal grooming?: A Little Help from another person toileting, which includes using toliet, bedpan, or urinal?: A Little Help from another person bathing (including washing, rinsing, drying)?: A Little Help from another person to put on and taking off regular upper body clothing?: A Little Help from another person to put on and taking off regular lower body clothing?: A Little 6 Click Score: 18    End of Session    OT Visit Diagnosis: Pain;Other abnormalities of gait and mobility  (R26.89);Unsteadiness on feet (R26.81) Pain - Right/Left: Left Pain - part of body: Hip   Activity Tolerance Patient tolerated treatment well   Patient Left in bed;with call bell/phone within reach;with bed alarm set   Nurse Communication Mobility status        Time: 9280-9258 OT Time Calculation (min): 22 min  Charges: OT General Charges $OT Visit: 1 Visit OT Treatments $Therapeutic Exercise: 8-22 mins  [name], OTA Acute Rehabilitation Services  Office [phone]   [name] 01/08/2024, 9:46 AM

## 2024-01-08 NOTE — Plan of Care (Signed)
  Problem: Health Behavior/Discharge Planning: Goal: Ability to manage health-related needs will improve Outcome: Progressing   Problem: Clinical Measurements: Goal: Ability to maintain clinical measurements within normal limits will improve Outcome: Progressing Goal: Will remain free from infection Outcome: Progressing   Problem: Activity: Goal: Risk for activity intolerance will decrease Outcome: Progressing   Problem: Nutrition: Goal: Adequate nutrition will be maintained Outcome: Progressing   Problem: Pain Managment: Goal: General experience of comfort will improve and/or be controlled Outcome: Progressing   Problem: Safety: Goal: Ability to remain free from injury will improve Outcome: Progressing   Problem: Skin Integrity: Goal: Risk for impaired skin integrity will decrease Outcome: Progressing

## 2024-01-09 LAB — GLUCOSE, CAPILLARY
Glucose-Capillary: 89 mg/dL (ref 70–99)
Glucose-Capillary: 89 mg/dL (ref 70–99)
Glucose-Capillary: 78 mg/dL (ref 70–99)
Glucose-Capillary: 91 mg/dL (ref 70–99)

## 2024-01-09 MED ORDER — GABAPENTIN 100 MG PO CAPS
200.0000 mg | ORAL_CAPSULE | Freq: Three times a day (TID) | ORAL | Status: DC
  Administered 2024-01-09 – 2024-02-12 (×98): 200 mg via ORAL
  Filled 2024-01-09 (×102): qty 2

## 2024-01-09 NOTE — Plan of Care (Signed)
 Problem: Education: Goal: Knowledge of General Education information will improve Description: Including pain rating scale, medication(s)/side effects and non-pharmacologic comfort measures 01/09/2024 0701 by Inocente Alfonse BRAVO, RN Outcome: Progressing 01/09/2024 0701 by Inocente Alfonse BRAVO, RN Outcome: Progressing   Problem: Health Behavior/Discharge Planning: Goal: Ability to manage health-related needs will improve 01/09/2024 0701 by Inocente Alfonse BRAVO, RN Outcome: Progressing 01/09/2024 0701 by Inocente Alfonse BRAVO, RN Outcome: Progressing   Problem: Clinical Measurements: Goal: Ability to maintain clinical measurements within normal limits will improve 01/09/2024 0701 by Inocente Alfonse BRAVO, RN Outcome: Progressing 01/09/2024 0701 by Inocente Alfonse BRAVO, RN Outcome: Progressing Goal: Will remain free from infection 01/09/2024 0701 by Inocente Alfonse BRAVO, RN Outcome: Progressing 01/09/2024 0701 by Inocente Alfonse BRAVO, RN Outcome: Progressing Goal: Diagnostic test results will improve 01/09/2024 0701 by Inocente Alfonse BRAVO, RN Outcome: Progressing 01/09/2024 0701 by Inocente Alfonse BRAVO, RN Outcome: Progressing Goal: Respiratory complications will improve 01/09/2024 0701 by Inocente Alfonse BRAVO, RN Outcome: Progressing 01/09/2024 0701 by Inocente Alfonse BRAVO, RN Outcome: Progressing Goal: Cardiovascular complication will be avoided 01/09/2024 0701 by Inocente Alfonse BRAVO, RN Outcome: Progressing 01/09/2024 0701 by Inocente Alfonse BRAVO, RN Outcome: Progressing   Problem: Activity: Goal: Risk for activity intolerance will decrease 01/09/2024 0701 by Inocente Alfonse BRAVO, RN Outcome: Progressing 01/09/2024 0701 by Inocente Alfonse BRAVO, RN Outcome: Progressing   Problem: Nutrition: Goal: Adequate nutrition will be maintained 01/09/2024 0701 by Inocente Alfonse BRAVO, RN Outcome: Progressing 01/09/2024 0701 by Inocente Alfonse BRAVO, RN Outcome: Progressing   Problem: Coping: Goal: Level of anxiety will decrease 01/09/2024 0701 by Inocente Alfonse BRAVO, RN Outcome: Progressing 01/09/2024 0701 by Inocente Alfonse BRAVO, RN Outcome: Progressing   Problem: Elimination: Goal: Will not experience complications related to bowel motility 01/09/2024 0701 by Inocente Alfonse BRAVO, RN Outcome: Progressing 01/09/2024 0701 by Inocente Alfonse BRAVO, RN Outcome: Progressing Goal: Will not experience complications related to urinary retention 01/09/2024 0701 by Inocente Alfonse BRAVO, RN Outcome: Progressing 01/09/2024 0701 by Inocente Alfonse BRAVO, RN Outcome: Progressing   Problem: Pain Managment: Goal: General experience of comfort will improve and/or be controlled 01/09/2024 0701 by Inocente Alfonse BRAVO, RN Outcome: Progressing 01/09/2024 0701 by Inocente Alfonse BRAVO, RN Outcome: Progressing   Problem: Safety: Goal: Ability to remain free from injury will improve 01/09/2024 0701 by Inocente Alfonse BRAVO, RN Outcome: Progressing 01/09/2024 0701 by Inocente Alfonse BRAVO, RN Outcome: Progressing   Problem: Skin Integrity: Goal: Risk for impaired skin integrity will decrease 01/09/2024 0701 by Inocente Alfonse BRAVO, RN Outcome: Progressing 01/09/2024 0701 by Inocente Alfonse BRAVO, RN Outcome: Progressing   Problem: Education: Goal: Ability to describe self-care measures that may prevent or decrease complications (Diabetes Survival Skills Education) will improve 01/09/2024 0701 by Inocente Alfonse BRAVO, RN Outcome: Progressing 01/09/2024 0701 by Inocente Alfonse BRAVO, RN Outcome: Progressing Goal: Individualized Educational Video(s) 01/09/2024 0701 by Inocente Alfonse BRAVO, RN Outcome: Progressing 01/09/2024 0701 by Inocente Alfonse BRAVO, RN Outcome: Progressing   Problem: Coping: Goal: Ability to adjust to condition or change in health will improve 01/09/2024 0701 by Inocente Alfonse BRAVO, RN Outcome: Progressing 01/09/2024 0701 by Inocente Alfonse BRAVO, RN Outcome: Progressing   Problem: Fluid Volume: Goal: Ability to maintain a balanced intake and output will improve 01/09/2024 0701 by Inocente Alfonse BRAVO,  RN Outcome: Progressing 01/09/2024 0701 by Inocente Alfonse BRAVO, RN Outcome: Progressing   Problem: Health Behavior/Discharge Planning: Goal: Ability to identify and utilize available resources and services will improve 01/09/2024 0701 by Inocente Alfonse BRAVO, RN Outcome: Progressing 01/09/2024 0701 by  Inocente Alfonse BRAVO, RN Outcome: Progressing Goal: Ability to manage health-related needs will improve 01/09/2024 0701 by Inocente Alfonse BRAVO, RN Outcome: Progressing 01/09/2024 0701 by Inocente Alfonse BRAVO, RN Outcome: Progressing   Problem: Metabolic: Goal: Ability to maintain appropriate glucose levels will improve 01/09/2024 0701 by Inocente Alfonse BRAVO, RN Outcome: Progressing 01/09/2024 0701 by Inocente Alfonse BRAVO, RN Outcome: Progressing   Problem: Nutritional: Goal: Maintenance of adequate nutrition will improve 01/09/2024 0701 by Inocente Alfonse BRAVO, RN Outcome: Progressing 01/09/2024 0701 by Inocente Alfonse BRAVO, RN Outcome: Progressing Goal: Progress toward achieving an optimal weight will improve 01/09/2024 0701 by Inocente Alfonse BRAVO, RN Outcome: Progressing 01/09/2024 0701 by Inocente Alfonse BRAVO, RN Outcome: Progressing   Problem: Skin Integrity: Goal: Risk for impaired skin integrity will decrease 01/09/2024 0701 by Inocente Alfonse BRAVO, RN Outcome: Progressing 01/09/2024 0701 by Inocente Alfonse BRAVO, RN Outcome: Progressing   Problem: Tissue Perfusion: Goal: Adequacy of tissue perfusion will improve 01/09/2024 0701 by Inocente Alfonse BRAVO, RN Outcome: Progressing 01/09/2024 0701 by Inocente Alfonse BRAVO, RN Outcome: Progressing

## 2024-01-09 NOTE — Progress Notes (Signed)
 Physical Therapy Treatment Patient Details Name: Alvin Daniels MRN: [id number] DOB: [birth year] Today's Date: 01/09/2024   History of Present Illness Patient is a 53 y/o male admitted 12/14/23 with hip pain MRI showing R psoas abscess and L4-L5 discitis and osteomyelitis. 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  PMH includes ESRD on HD MWF, hepC, MRSA, HIV, bilat BKA, DMII.    PT Comments  Pt with fair tolerance to treatment today. Pt remains limited by 9/10 BLE pain declining all transfers however was agreeable to sit EOB Mod I. No change in DC/DME recs at this time. PT will continue to follow.     If plan is discharge home, recommend the following: A little help with walking and/or transfers;Help with stairs or ramp for entrance;Assist for transportation   Can travel by private vehicle        Equipment Recommendations  BSC/3in1    Recommendations for Other Services       Precautions / Restrictions Precautions Precautions: Fall Recall of Precautions/Restrictions: Impaired Precaution/Restrictions Comments: B BKA; prosthetics in the room Restrictions Weight Bearing Restrictions Per Provider Order: No     Mobility  Bed Mobility Overal bed mobility: Modified Independent Bed Mobility: Supine to Sit, Sit to Supine     Supine to sit: Modified independent (Device/Increase time) Sit to supine: Modified independent (Device/Increase time)   General bed mobility comments: able to get to EOB without assistance however declined any further PT due to pain.    Transfers                   General transfer comment: declined transfers    Ambulation/Gait                   Stairs             Wheelchair Mobility     Tilt Bed    Modified Rankin (Stroke Patients Only)       Balance Overall balance assessment: Needs assistance Sitting-balance support: Feet unsupported,  Bilateral upper extremity supported Sitting balance-Leahy Scale: Good                                      Communication Communication Communication: No apparent difficulties;Other (comment) Factors Affecting Communication: Non - English speaking, interpreter not available Alvin Daniels [phone])  Cognition Arousal: Alert Behavior During Therapy: WFL for tasks assessed/performed   PT - Cognitive impairments: Safety/Judgement                         Following commands: Intact      Cueing Cueing Techniques: Verbal cues, Visual cues  Exercises      General Comments General comments (skin integrity, edema, etc.): VSS      Pertinent Vitals/Pain Pain Assessment Pain Assessment: 0-10 Pain Score: 9  Pain Location: LLE at distal residual limb Pain Descriptors / Indicators: Discomfort, Sore Pain Intervention(s): Limited activity within patient's tolerance, Repositioned, Monitored during session, Premedicated before session    Home Living                          Prior Function            PT Goals (current goals can now be found in the care plan section) Progress towards PT goals: Progressing toward goals    Frequency  Min 2X/week      PT Plan      Co-evaluation              AM-PAC PT 6 Clicks Mobility   Outcome Measure  Help needed turning from your back to your side while in a flat bed without using bedrails?: None Help needed moving from lying on your back to sitting on the side of a flat bed without using bedrails?: None Help needed moving to and from a bed to a chair (including a wheelchair)?: A Little Help needed standing up from a chair using your arms (e.g., wheelchair or bedside chair)?: A Little Help needed to walk in hospital room?: A Little Help needed climbing 3-5 steps with a railing? : Total 6 Click Score: 18    End of Session   Activity Tolerance: Patient tolerated treatment well Patient left: in bed;with  call bell/phone within reach;with bed alarm set Nurse Communication: Mobility status PT Visit Diagnosis: Pain;Other abnormalities of gait and mobility (R26.89) Pain - Right/Left: Left Pain - part of body: Hip;Leg     Time: 8573-8564 PT Time Calculation (min) (ACUTE ONLY): 9 min  Charges:    $Therapeutic Activity: 8-22 mins PT General Charges $$ ACUTE PT VISIT: 1 Visit                     Alvin Daniels, PT, DPT Acute Rehab Services [phone]    Alvin Daniels 01/09/2024, 3:15 PM

## 2024-01-09 NOTE — Progress Notes (Signed)
 PROGRESS NOTE        PATIENT DETAILS Name: Alvin Daniels Age: 53 y.o. Sex: male Date of Birth: [birth year] Admit Date: 12/14/2023 Admitting Physician Marsha Ada, MD PCP:Pcp, No  Brief Summary: Patient is a 53 y.o.  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  Significant events: 8/4>> admit to [facility]  Significant studies: 6/4>> TEE: EF 60-65%, 1.8 x 1.2 mass right atrial wall-?vegetation from old HD catheter impinged on RA wall-versus mass/tumor. 8/4>> MRI pelvis: Discitis/osteomyelitis L4-5-right psoas abscess. 8/5>> CT abdomen/pelvis: Discitis/osteomyelitis L4-5 with paraspinal inflammatory changes/right psoas abscess.  8/8>> MRI LS spine: L4-5 discitis/osteomyelitis with associated paraspinal inflammatory changes, right psoas abscess. 8/20>> MRI left hip: Discitis of the lower lumbar spine with extension into the psoas muscle. 8/20>> BKA-no significant abnormality.  Significant microbiology data: 5/28>> blood culture: MRSA 5/29>> left finger abscess: E. coli/Streptococcus anginosus, Bacteroides fragilis. 8/4>> blood culture: No growth  Procedures: None.  Consults: ID Nephrology Ophthalmology  Subjective:   Patient in bed, appears comfortable, denies any headache, no fever, no chest pain or pressure, no shortness of breath , no abdominal pain. No new focal weakness.  Having some pain in the left BKA stump site     Objective: Vitals: Blood pressure 91/74, pulse 88, temperature 98.5 F (36.9 C), temperature source Oral, resp. rate 12, height 4' 11 (1.499 m), weight 38.9 kg, SpO2 100%.   Exam:  Awake Alert, No new F.N deficits, Normal affect [place].AT,PERRAL Supple Neck, No  JVD,   Symmetrical Chest wall movement, Good air movement bilaterally, CTAB RRR,No Gallops, Rubs or new Murmurs,  +ve B.Sounds, Abd Soft, No tenderness,   Bilateral BKA, right IJ HD catheter   Assessment/Plan:  L4-L5 discitis with surrounding paraspinal inflammatory change and right psoas abscess Recent history of MRSA bacteremia/left fourth finger osteomyelitis May 2025 (strep/Bacteroides/E. coli on bone culture) Likely MRSA-although cultures this admission negative.   Evaluated by ID-recommendations are for vancomycin /cefepime  x 8 weeks-EOT 9/29 Reviewed prior notes-not a surgical candidate-IR unable to drain due to lack of safe window. Pain is in better control Repeat MRI on 01/05/2024 noted with progression of disease, ID following they have recommended IR to do a biopsy for guided sensitivity and culture, disc biopsy by IR Dr. Johann on 01/07/2024.  Monitor results.  Syncope on 8/20 Probably related to soft blood pressure/orthostatic hypotension Telemetry monitoring unremarkable.  Chronic hypotension On midodrine  Narcotics for back pain very not helping. Minimize narcotics as much as possible-avoid further escalation in narcotic dosing.  ESRD on HD MWF Nephrology following.  Normocytic anemia Secondary to ESRD Hb stable Aranesp /iron  defer to nephrology service Follow  PAD-s/p bilateral BKA Normally uses prosthesis Due to severe pain-he has been unable to ambulate with a prosthesis.  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  Difficult disposition No family-uninsured-SNF not a option per SW-history of ESRD with bilateral BKA-currently unable  to ambulate with his prostheses-hence unable to be  discharged home safely.   Normally-prior to this hospitalization was able to ambulate with prosthesis and catch the bus/scat transport to go back and forth from dialysis center.   DM-2 (A1c 10.0 on 3/6) CBG stable SSI  Recent Labs    01/07/24 2137 01/08/24 0839 01/08/24 2156  GLUCAP 118* 114* 117*        Code status:   Code Status: Full Code   DVT Prophylaxis: heparin  injection 5,000 Units Start: 12/15/23 1400   Family Communication: None at bedside   Disposition Plan: Status is: Inpatient Remains inpatient appropriate because: Severe of illness   Planned Discharge Destination:Home health   Diet: Diet Order             Diet renal with fluid restriction Fluid restriction: 1200 mL Fluid; Room service appropriate? Yes; Fluid consistency: Thin  Diet effective now                   I have personally reviewed following labs and imaging studies  LABORATORY DATA:   Data Review:   Inpatient Medications  Scheduled Meds:  acetaminophen   1,000 mg Oral TID   artificial tears   Right Eye QID   bictegravir-emtricitabine -tenofovir  AF  1 tablet Oral Daily   Chlorhexidine  Gluconate Cloth  6 each Topical Q0600   darbepoetin (ARANESP ) injection - DIALYSIS  100 mcg Subcutaneous Q Fri-1800   fentaNYL   1 patch Transdermal Q72H   gabapentin   200 mg Oral TID   gatifloxacin   1 drop Right Eye BID   heparin   5,000 Units Subcutaneous Q8H   ibuprofen   600 mg Oral TID WC   insulin  aspart  0-5 Units Subcutaneous QHS   insulin  aspart  0-6 Units Subcutaneous TID WC   lidocaine   2 patch Transdermal Q24H   methocarbamol   500 mg Oral TID   midodrine   10 mg Oral TID WC   midodrine   10 mg Oral Q T,Th,Sa-HD   pantoprazole   40 mg Oral Q1200   polyethylene glycol  17 g Oral Daily   sevelamer  carbonate  800 mg Oral TID WC   Continuous Infusions:  vancomycin  500 mg (01/08/24 1611)   PRN Meds:.glucagon  (human recombinant),  hydrALAZINE , ipratropium-albuterol , metoprolol  tartrate, ondansetron  (ZOFRAN ) IV, oxyCODONE , sodium chloride  flush  DVT Prophylaxis  heparin  injection 5,000 Units Start: 12/15/23 1400     Recent Labs  Lab 01/05/24 0554 01/08/24 1023  WBC 8.3 7.8  HGB 9.8* 8.5*  HCT 30.7* 26.7*  PLT 253 266  MCV 100.3* 98.5  MCH 32.0 31.4  MCHC 31.9 31.8  RDW 16.1* 16.5*  LYMPHSABS 1.6  --   MONOABS 0.7  --   EOSABS 0.1  --   BASOSABS 0.1  --     Recent Labs  Lab 01/05/24 0554 01/07/24 1226 01/08/24 1037  NA 130*  --  131*  K 4.5  --  4.8  CL 93*  --  93*  CO2 22  --  21*  ANIONGAP 15  --  17*  GLUCOSE 158*  --  104*  BUN 28*  --  33*  CREATININE 5.31*  --  5.53*  ALBUMIN   --   --  2.5*  CRP 16.1*  --   --   PROCALCITON 15.67  --   --   INR  --  1.1  --   PHOS 3.9  --  4.0  CALCIUM  9.5  --  9.4      Recent Labs  Lab 01/05/24 0554 01/07/24 1226 01/08/24 1037  CRP  16.1*  --   --   PROCALCITON 15.67  --   --   INR  --  1.1  --   CALCIUM  9.5  --  9.4    --------------------------------------------------------------------------------------------------------------- Lab Results  Component Value Date   CHOL 166 04/16/2023   HDL 40 04/16/2023   LDLCALC 90 04/16/2023   TRIG 277 (H) 04/16/2023   CHOLHDL 4.2 04/16/2023    Lab Results  Component Value Date   HGBA1C 10.0 (A) 07/17/2023      Signature  -   Lavada Stank M.D on 01/09/2024 at 7:25 AM   -  To page go to [email or web address]

## 2024-01-09 NOTE — Progress Notes (Signed)
 [facility] Progress Note   Dialysis Orders: NW-MWF 3.5h  B400  45.5kg  TDC  Heparin  2000 Mircera 100 mcg - last dose 12/01/23 Calcitriol  1.0 mcg (on hold due to high Ca)   Home bp meds: Midodrine  10mg  tid prn for SBP < 100 Midodrine  10mg  pre HD mwf prn   Assessment/Plan: 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 Not a surgical candidate. Blood cxs ng. Plan for IV Cefepime  2g and IV Vancomycin  500mg  with HD X 8 weeks end date 02/09/24. Pain management per primary team.   ESRD: Usual HD MWF.  Off schedule but will continue TTS schedule for now while he is in the hospital.  Tolerated dialysis on Thur but did complain of left leg pain postdialysis.  Appetite is very poor; BP low on dialysis.    For dialysis tomorrow  BP: chronic low BP on midodrine  10 tid prn and 10mg  pre HD mwf as needed for SBP < 100. Can give albumin  during HD for low BP.  Volume:  UF as tolerated. Has lost weight. Now under EDW. Will need EDW changed at d/c.  S/p L4-5 D on 01/08/2024, 3 cc of bloody fluid  ABUNDANT WBC PRESENT, PREDOMINANTLY PMN  RARE GRAM POSITIVE COCCI   Anemia of esrd: On Aranesp  100 q week.  No IV iron  due to infection.  HIV: controlled on Biktarvy  BMD:  Continue sevelamer  TID WC. VDRA on hold. Continue to trend Ca; P3.9 8/25 Nutrition - renal diet w/fluid restrictions. Debility: PT has requested that we schedule him for 2nd shift whenever we can.   Subjective:  Good spirits this morning complaining of left leg pain since after dialysis yesterday.  Denies fever, chills, shortness of breath, chest pain.    Objective Vitals:   01/08/24 1300 01/08/24 1315 01/08/24 1332 01/09/24 0553  BP: 115/78 116/78 102/74 91/74  Pulse: 86 87 99 88  Resp: 11 20 12    Temp:   (!) 97.5 F (36.4 C) 98.5 F (36.9 C)  TempSrc:    Oral  SpO2: 100% 100% 100%   Weight:      Height:       Physical Exam General: Alert, nad  Heart: RRR  Lungs: nml WOB on RA Abdomen:  soft, ND Extremities: b/l BKA, no edema Dialysis Access: Right IJ TDC, thrombosed left arm aneurysmal brachiocephalic fistula which is pulsatile inflow (left BCF was placed 05/10/2016)  Filed Weights   01/06/24 0731 01/06/24 1145 01/08/24 0953  Weight: 40.8 kg 41.3 kg 38.9 kg    Intake/Output Summary (Last 24 hours) at 01/09/2024 0733 Last data filed at 01/08/2024 1332 Gross per 24 hour  Intake --  Output 100 ml  Net -100 ml     Additional Objective Labs: Basic Metabolic Panel: Recent Labs  Lab 01/05/24 0554 01/08/24 1037  NA 130* 131*  K 4.5 4.8  CL 93* 93*  CO2 22 21*  GLUCOSE 158* 104*  BUN 28* 33*  CREATININE 5.31* 5.53*  CALCIUM  9.5 9.4  PHOS 3.9 4.0    CBC: Recent Labs  Lab 01/05/24 0554 01/08/24 1023  WBC 8.3 7.8  NEUTROABS 5.8  --   HGB 9.8* 8.5*  HCT 30.7* 26.7*  MCV 100.3* 98.5  PLT 253 266    Medications:  vancomycin  500 mg (01/08/24 1611)    acetaminophen   1,000 mg Oral TID   artificial tears   Right Eye QID   bictegravir-emtricitabine -tenofovir  AF  1 tablet Oral Daily   Chlorhexidine  Gluconate Cloth  6 each Topical Q0600   darbepoetin (ARANESP ) injection - DIALYSIS  100 mcg Subcutaneous Q Fri-1800   fentaNYL   1 patch Transdermal Q72H   gabapentin   200 mg Oral TID   gatifloxacin   1 drop Right Eye BID   heparin   5,000 Units Subcutaneous Q8H   ibuprofen   600 mg Oral TID WC   insulin  aspart  0-5 Units Subcutaneous QHS   insulin  aspart  0-6 Units Subcutaneous TID WC   lidocaine   2 patch Transdermal Q24H   methocarbamol   500 mg Oral TID   midodrine   10 mg Oral TID WC   midodrine   10 mg Oral Q T,Th,Sa-HD   pantoprazole   40 mg Oral Q1200   polyethylene glycol  17 g Oral Daily   sevelamer  carbonate  800 mg Oral TID WC

## 2024-01-09 NOTE — Plan of Care (Signed)
 Problem: Education: Goal: Knowledge of General Education information will improve Description: Including pain rating scale, medication(s)/side effects and non-pharmacologic comfort measures 01/09/2024 0701 by Inocente Alfonse BRAVO, RN Outcome: Progressing 01/09/2024 0701 by Inocente Alfonse BRAVO, RN Outcome: Progressing 01/09/2024 0701 by Inocente Alfonse BRAVO, RN Outcome: Progressing   Problem: Health Behavior/Discharge Planning: Goal: Ability to manage health-related needs will improve 01/09/2024 0701 by Inocente Alfonse BRAVO, RN Outcome: Progressing 01/09/2024 0701 by Inocente Alfonse BRAVO, RN Outcome: Progressing 01/09/2024 0701 by Inocente Alfonse BRAVO, RN Outcome: Progressing   Problem: Clinical Measurements: Goal: Ability to maintain clinical measurements within normal limits will improve 01/09/2024 0701 by Inocente Alfonse BRAVO, RN Outcome: Progressing 01/09/2024 0701 by Inocente Alfonse BRAVO, RN Outcome: Progressing 01/09/2024 0701 by Inocente Alfonse BRAVO, RN Outcome: Progressing Goal: Will remain free from infection 01/09/2024 0701 by Inocente Alfonse BRAVO, RN Outcome: Progressing 01/09/2024 0701 by Inocente Alfonse BRAVO, RN Outcome: Progressing 01/09/2024 0701 by Inocente Alfonse BRAVO, RN Outcome: Progressing Goal: Diagnostic test results will improve 01/09/2024 0701 by Inocente Alfonse BRAVO, RN Outcome: Progressing 01/09/2024 0701 by Inocente Alfonse BRAVO, RN Outcome: Progressing 01/09/2024 0701 by Inocente Alfonse BRAVO, RN Outcome: Progressing Goal: Respiratory complications will improve 01/09/2024 0701 by Inocente Alfonse BRAVO, RN Outcome: Progressing 01/09/2024 0701 by Inocente Alfonse BRAVO, RN Outcome: Progressing 01/09/2024 0701 by Inocente Alfonse BRAVO, RN Outcome: Progressing Goal: Cardiovascular complication will be avoided 01/09/2024 0701 by Inocente Alfonse BRAVO, RN Outcome: Progressing 01/09/2024 0701 by Inocente Alfonse BRAVO, RN Outcome: Progressing 01/09/2024 0701 by Inocente Alfonse BRAVO, RN Outcome: Progressing   Problem: Activity: Goal: Risk for  activity intolerance will decrease 01/09/2024 0701 by Inocente Alfonse BRAVO, RN Outcome: Progressing 01/09/2024 0701 by Inocente Alfonse BRAVO, RN Outcome: Progressing 01/09/2024 0701 by Inocente Alfonse BRAVO, RN Outcome: Progressing   Problem: Nutrition: Goal: Adequate nutrition will be maintained 01/09/2024 0701 by Inocente Alfonse BRAVO, RN Outcome: Progressing 01/09/2024 0701 by Inocente Alfonse BRAVO, RN Outcome: Progressing 01/09/2024 0701 by Inocente Alfonse BRAVO, RN Outcome: Progressing   Problem: Coping: Goal: Level of anxiety will decrease 01/09/2024 0701 by Inocente Alfonse BRAVO, RN Outcome: Progressing 01/09/2024 0701 by Inocente Alfonse BRAVO, RN Outcome: Progressing 01/09/2024 0701 by Inocente Alfonse BRAVO, RN Outcome: Progressing   Problem: Elimination: Goal: Will not experience complications related to bowel motility 01/09/2024 0701 by Inocente Alfonse BRAVO, RN Outcome: Progressing 01/09/2024 0701 by Inocente Alfonse BRAVO, RN Outcome: Progressing 01/09/2024 0701 by Inocente Alfonse BRAVO, RN Outcome: Progressing Goal: Will not experience complications related to urinary retention 01/09/2024 0701 by Inocente Alfonse BRAVO, RN Outcome: Progressing 01/09/2024 0701 by Inocente Alfonse BRAVO, RN Outcome: Progressing 01/09/2024 0701 by Inocente Alfonse BRAVO, RN Outcome: Progressing   Problem: Pain Managment: Goal: General experience of comfort will improve and/or be controlled 01/09/2024 0701 by Inocente Alfonse BRAVO, RN Outcome: Progressing 01/09/2024 0701 by Inocente Alfonse BRAVO, RN Outcome: Progressing 01/09/2024 0701 by Inocente Alfonse BRAVO, RN Outcome: Progressing   Problem: Safety: Goal: Ability to remain free from injury will improve 01/09/2024 0701 by Inocente Alfonse BRAVO, RN Outcome: Progressing 01/09/2024 0701 by Inocente Alfonse BRAVO, RN Outcome: Progressing 01/09/2024 0701 by Inocente Alfonse BRAVO, RN Outcome: Progressing   Problem: Skin Integrity: Goal: Risk for impaired skin integrity will decrease 01/09/2024 0701 by Inocente Alfonse BRAVO, RN Outcome:  Progressing 01/09/2024 0701 by Inocente Alfonse BRAVO, RN Outcome: Progressing 01/09/2024 0701 by Inocente Alfonse BRAVO, RN Outcome: Progressing   Problem: Education: Goal: Ability to describe self-care measures that may prevent or decrease complications (Diabetes Survival Skills Education) will improve 01/09/2024 0701 by  Inocente Alfonse BRAVO, RN Outcome: Progressing 01/09/2024 0701 by Inocente Alfonse BRAVO, RN Outcome: Progressing 01/09/2024 0701 by Inocente Alfonse BRAVO, RN Outcome: Progressing Goal: Individualized Educational Video(s) 01/09/2024 0701 by Inocente Alfonse BRAVO, RN Outcome: Progressing 01/09/2024 0701 by Inocente Alfonse BRAVO, RN Outcome: Progressing 01/09/2024 0701 by Inocente Alfonse BRAVO, RN Outcome: Progressing   Problem: Coping: Goal: Ability to adjust to condition or change in health will improve 01/09/2024 0701 by Inocente Alfonse BRAVO, RN Outcome: Progressing 01/09/2024 0701 by Inocente Alfonse BRAVO, RN Outcome: Progressing 01/09/2024 0701 by Inocente Alfonse BRAVO, RN Outcome: Progressing   Problem: Fluid Volume: Goal: Ability to maintain a balanced intake and output will improve 01/09/2024 0701 by Inocente Alfonse BRAVO, RN Outcome: Progressing 01/09/2024 0701 by Inocente Alfonse BRAVO, RN Outcome: Progressing 01/09/2024 0701 by Inocente Alfonse BRAVO, RN Outcome: Progressing   Problem: Health Behavior/Discharge Planning: Goal: Ability to identify and utilize available resources and services will improve 01/09/2024 0701 by Inocente Alfonse BRAVO, RN Outcome: Progressing 01/09/2024 0701 by Inocente Alfonse BRAVO, RN Outcome: Progressing 01/09/2024 0701 by Inocente Alfonse BRAVO, RN Outcome: Progressing Goal: Ability to manage health-related needs will improve 01/09/2024 0701 by Inocente Alfonse BRAVO, RN Outcome: Progressing 01/09/2024 0701 by Inocente Alfonse BRAVO, RN Outcome: Progressing 01/09/2024 0701 by Inocente Alfonse BRAVO, RN Outcome: Progressing   Problem: Metabolic: Goal: Ability to maintain appropriate glucose levels will improve 01/09/2024 0701 by  Inocente Alfonse BRAVO, RN Outcome: Progressing 01/09/2024 0701 by Inocente Alfonse BRAVO, RN Outcome: Progressing 01/09/2024 0701 by Inocente Alfonse BRAVO, RN Outcome: Progressing   Problem: Nutritional: Goal: Maintenance of adequate nutrition will improve 01/09/2024 0701 by Inocente Alfonse BRAVO, RN Outcome: Progressing 01/09/2024 0701 by Inocente Alfonse BRAVO, RN Outcome: Progressing 01/09/2024 0701 by Inocente Alfonse BRAVO, RN Outcome: Progressing Goal: Progress toward achieving an optimal weight will improve 01/09/2024 0701 by Inocente Alfonse BRAVO, RN Outcome: Progressing 01/09/2024 0701 by Inocente Alfonse BRAVO, RN Outcome: Progressing 01/09/2024 0701 by Inocente Alfonse BRAVO, RN Outcome: Progressing   Problem: Skin Integrity: Goal: Risk for impaired skin integrity will decrease 01/09/2024 0701 by Inocente Alfonse BRAVO, RN Outcome: Progressing 01/09/2024 0701 by Inocente Alfonse BRAVO, RN Outcome: Progressing 01/09/2024 0701 by Inocente Alfonse BRAVO, RN Outcome: Progressing   Problem: Tissue Perfusion: Goal: Adequacy of tissue perfusion will improve 01/09/2024 0701 by Inocente Alfonse BRAVO, RN Outcome: Progressing 01/09/2024 0701 by Inocente Alfonse BRAVO, RN Outcome: Progressing 01/09/2024 0701 by Inocente Alfonse BRAVO, RN Outcome: Progressing

## 2024-01-09 NOTE — Plan of Care (Signed)

## 2024-01-10 LAB — GLUCOSE, CAPILLARY
Glucose-Capillary: 87 mg/dL (ref 70–99)
Glucose-Capillary: 170 mg/dL — ABNORMAL HIGH (ref 70–99)
Glucose-Capillary: 99 mg/dL (ref 70–99)
Glucose-Capillary: 130 mg/dL — ABNORMAL HIGH (ref 70–99)

## 2024-01-10 MED ORDER — VANCOMYCIN HCL 500 MG/100ML IV SOLN
INTRAVENOUS | Status: AC
  Filled 2024-01-10: qty 100

## 2024-01-10 MED ORDER — MIDODRINE HCL 5 MG PO TABS
ORAL_TABLET | ORAL | Status: AC
  Filled 2024-01-10: qty 2

## 2024-01-10 NOTE — Progress Notes (Signed)
 PROGRESS NOTE        PATIENT DETAILS Name: Alvin Daniels Age: 53 y.o. Sex: male Date of Birth: 08-18-1970 Admit Date: 12/14/2023 Admitting Physician Marsha Ada, MD PCP:Pcp, No  Brief Summary: Patient is a 53 y.o.  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  Significant events: 8/4>> admit to [facility]  Significant studies: 6/4>> TEE: EF 60-65%, 1.8 x 1.2 mass right atrial wall-?vegetation from old HD catheter impinged on RA wall-versus mass/tumor. 8/4>> MRI pelvis: Discitis/osteomyelitis L4-5-right psoas abscess. 8/5>> CT abdomen/pelvis: Discitis/osteomyelitis L4-5 with paraspinal inflammatory changes/right psoas abscess.  8/8>> MRI LS spine: L4-5 discitis/osteomyelitis with associated paraspinal inflammatory changes, right psoas abscess. 8/20>> MRI left hip: Discitis of the lower lumbar spine with extension into the psoas muscle. 8/20>> BKA-no significant abnormality.  Significant microbiology data: 5/28>> blood culture: MRSA 5/29>> left finger abscess: E. coli/Streptococcus anginosus, Bacteroides fragilis. 8/4>> blood culture: No growth  Procedures: None.  Consults: ID Nephrology Ophthalmology  Subjective:  Patient in bed, appears comfortable, denies any headache, no fever, no chest pain or pressure, no shortness of breath , no abdominal pain. No new focal weakness, improved but still having some pain in the left BKA stump site     Objective: Vitals: Blood pressure 92/64, pulse 70, temperature 97.8 F (36.6 C), temperature source Oral, resp. rate 18, height 4' 11 (1.499 m), weight 38.9 kg, SpO2 95%.   Exam:  Awake Alert, No new F.N deficits, Normal  affect [place].AT,PERRAL Supple Neck, No JVD,   Symmetrical Chest wall movement, Good air movement bilaterally, CTAB RRR,No Gallops, Rubs or new Murmurs,  +ve B.Sounds, Abd Soft, No tenderness,   Bilateral BKA, right IJ HD catheter   Assessment/Plan:  L4-L5 discitis with surrounding paraspinal inflammatory change and right psoas abscess Recent history of MRSA bacteremia/left fourth finger osteomyelitis May 2025 (strep/Bacteroides/E. coli on bone culture) Likely MRSA-although cultures this admission negative.   Evaluated by ID-recommendations are for vancomycin /cefepime  x 8 weeks-EOT 9/29 Reviewed prior notes-not a surgical candidate-IR unable to drain due to lack of safe window. Pain is in better control Repeat MRI on 01/05/2024 noted with progression of disease, ID following they have recommended IR to do a biopsy for guided sensitivity and culture, disc biopsy by IR Dr. Johann on 01/07/2024.  Monitor results.  Syncope on 8/20 Probably related to soft blood pressure/orthostatic hypotension Telemetry monitoring unremarkable.  Chronic hypotension On midodrine  Narcotics for back pain very not helping. Minimize narcotics as much as possible-avoid further escalation in narcotic dosing.  ESRD on HD MWF Nephrology following.  Normocytic anemia Secondary to ESRD Hb stable Aranesp /iron  defer to nephrology service Follow  PAD-s/p bilateral BKA Normally uses prosthesis Due to severe pain-he has been unable to ambulate with a prosthesis.  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  Difficult disposition No family-uninsured-SNF not a option per SW-history of  ESRD with bilateral BKA-currently unable to ambulate with his prostheses-hence unable to  be discharged home safely.   Normally-prior to this hospitalization was able to ambulate with prosthesis and catch the bus/scat transport to go back and forth from dialysis center.   DM-2 (A1c 10.0 on 3/6) CBG stable SSI  Recent Labs    01/09/24 1150 01/09/24 1610 01/09/24 2109  GLUCAP 89 78 91        Code status:   Code Status: Full Code   DVT Prophylaxis: heparin  injection 5,000 Units Start: 12/15/23 1400   Family Communication: None at bedside   Disposition Plan: Status is: Inpatient Remains inpatient appropriate because: Severe of illness   Planned Discharge Destination:Home health   Diet: Diet Order             Diet renal with fluid restriction Fluid restriction: 1200 mL Fluid; Room service appropriate? Yes; Fluid consistency: Thin  Diet effective now                   I have personally reviewed following labs and imaging studies  LABORATORY DATA:   Data Review:   Inpatient Medications  Scheduled Meds:  acetaminophen   1,000 mg Oral TID   artificial tears   Right Eye QID   bictegravir-emtricitabine -tenofovir  AF  1 tablet Oral Daily   Chlorhexidine  Gluconate Cloth  6 each Topical Q0600   darbepoetin (ARANESP ) injection - DIALYSIS  100 mcg Subcutaneous Q Fri-1800   fentaNYL   1 patch Transdermal Q72H   gabapentin   200 mg Oral TID   gatifloxacin   1 drop Right Eye BID   heparin   5,000 Units Subcutaneous Q8H   ibuprofen   600 mg Oral TID WC   insulin  aspart  0-5 Units Subcutaneous QHS   insulin  aspart  0-6 Units Subcutaneous TID WC   lidocaine   2 patch Transdermal Q24H   methocarbamol   500 mg Oral TID   midodrine   10 mg Oral TID WC   midodrine   10 mg Oral Q T,Th,Sa-HD   pantoprazole   40 mg Oral Q1200   polyethylene glycol  17 g Oral Daily   sevelamer  carbonate  800 mg Oral TID WC   Continuous Infusions:  vancomycin  500 mg (01/08/24 1611)   PRN  Meds:.glucagon  (human recombinant), hydrALAZINE , ipratropium-albuterol , metoprolol  tartrate, ondansetron  (ZOFRAN ) IV, oxyCODONE , sodium chloride  flush  DVT Prophylaxis  heparin  injection 5,000 Units Start: 12/15/23 1400     Recent Labs  Lab 01/05/24 0554 01/08/24 1023  WBC 8.3 7.8  HGB 9.8* 8.5*  HCT 30.7* 26.7*  PLT 253 266  MCV 100.3* 98.5  MCH 32.0 31.4  MCHC 31.9 31.8  RDW 16.1* 16.5*  LYMPHSABS 1.6  --   MONOABS 0.7  --   EOSABS 0.1  --   BASOSABS 0.1  --     Recent Labs  Lab 01/05/24 0554 01/07/24 1226 01/08/24 1037  NA 130*  --  131*  K 4.5  --  4.8  CL 93*  --  93*  CO2 22  --  21*  ANIONGAP 15  --  17*  GLUCOSE 158*  --  104*  BUN 28*  --  33*  CREATININE 5.31*  --  5.53*  ALBUMIN   --   --  2.5*  CRP 16.1*  --   --   PROCALCITON 15.67  --   --   INR  --  1.1  --   PHOS 3.9  --  4.0  CALCIUM  9.5  --  9.4      Recent Labs  Lab 01/05/24 0554 01/07/24 1226 01/08/24 1037  CRP 16.1*  --   --   PROCALCITON 15.67  --   --   INR  --  1.1  --   CALCIUM  9.5  --  9.4    --------------------------------------------------------------------------------------------------------------- Lab Results  Component Value Date   CHOL 166 04/16/2023   HDL 40 04/16/2023   LDLCALC 90 04/16/2023   TRIG 277 (H) 04/16/2023   CHOLHDL 4.2 04/16/2023    Lab Results  Component Value Date   HGBA1C 10.0 (A) 07/17/2023      Signature  -   Lavada Stank M.D on 01/10/2024 at 7:23 AM   -  To page go to [email or web address]

## 2024-01-10 NOTE — Progress Notes (Signed)
 [facility] Progress Note   Dialysis Orders: NW-MWF 3.5h  B400  45.5kg  TDC  Heparin  2000 Mircera 100 mcg - last dose 12/01/23 Calcitriol  1.0 mcg (on hold due to high Ca)   Home bp meds: Midodrine  10mg  tid prn for SBP < 100 Midodrine  10mg  pre HD mwf prn   Assessment/Plan: 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 Not a surgical candidate. Blood cxs ng. Plan for IV Cefepime  2g (last dose on 8/26, discontinue 8/28 by ID and IV Vancomycin  500mg  with HD X 8 weeks end date 02/09/24 -> Vancomycin  for 6 more weeks starting 8/27 last dose on 10/8). Pain management per primary team.   ESRD: Usual HD MWF.  Off schedule but will continue TTS schedule for now while he is in the hospital.  Tolerated dialysis on Thur but did complain of left leg pain postdialysis.  Appetite is very poor (eating outside food at night which is fine); BP low on dialysis.    For dialysis today  BP: chronic low BP on midodrine  10 tid prn and 10mg  pre HD mwf as needed for SBP < 100. Can give albumin  during HD for low BP.  Volume:  UF as tolerated. Has lost weight. Now under EDW. Will need EDW changed at d/c.  S/p L4-5 D on 01/08/2024, 3 cc of bloody fluid  ABUNDANT WBC PRESENT, PREDOMINANTLY PMN  RARE GRAM POSITIVE COCCI   Anemia of esrd: On Aranesp  100 q week.  No IV iron  due to infection.  HIV: controlled on Biktarvy  BMD:  Continue sevelamer  TID WC. VDRA on hold. Continue to trend Ca; P3.9 8/25 Nutrition - renal diet w/fluid restrictions. Debility: PT has requested that we schedule him for 2nd shift whenever we can.   Subjective:  Good spirits this morning states that left leg pain and back pain better.  He is not really eating much but ppl are bringing him food at night.  Denies fever, chills, shortness of breath, chest pain.    Objective Vitals:   01/09/24 2112 01/09/24 2327 01/10/24 0000 01/10/24 0410  BP: (!) 87/59 97/66 (!) 89/66 92/64  Pulse: 81 79  70  Resp:       Temp: 97.9 F (36.6 C) 97.7 F (36.5 C)  97.8 F (36.6 C)  TempSrc: Oral Oral  Oral  SpO2:      Weight:      Height:       Physical Exam General: Alert, nad  Heart: RRR  Lungs: nml WOB on RA Abdomen: soft, ND Extremities: b/l BKA, no edema Dialysis Access: Right IJ TDC, thrombosed left arm aneurysmal brachiocephalic fistula which is pulsatile inflow (left BCF was placed 05/10/2016)  Filed Weights   01/06/24 0731 01/06/24 1145 01/08/24 0953  Weight: 40.8 kg 41.3 kg 38.9 kg   No intake or output data in the 24 hours ending 01/10/24 0729    Additional Objective Labs: Basic Metabolic Panel: Recent Labs  Lab 01/05/24 0554 01/08/24 1037  NA 130* 131*  K 4.5 4.8  CL 93* 93*  CO2 22 21*  GLUCOSE 158* 104*  BUN 28* 33*  CREATININE 5.31* 5.53*  CALCIUM  9.5 9.4  PHOS 3.9 4.0    CBC: Recent Labs  Lab 01/05/24 0554 01/08/24 1023  WBC 8.3 7.8  NEUTROABS 5.8  --   HGB 9.8* 8.5*  HCT 30.7* 26.7*  MCV 100.3* 98.5  PLT 253 266    Medications:  vancomycin  500 mg (01/08/24 1611)    acetaminophen   1,000 mg Oral TID   artificial tears   Right Eye QID   bictegravir-emtricitabine -tenofovir  AF  1 tablet Oral Daily   Chlorhexidine  Gluconate Cloth  6 each Topical Q0600   darbepoetin (ARANESP ) injection - DIALYSIS  100 mcg Subcutaneous Q Fri-1800   fentaNYL   1 patch Transdermal Q72H   gabapentin   200 mg Oral TID   gatifloxacin   1 drop Right Eye BID   heparin   5,000 Units Subcutaneous Q8H   ibuprofen   600 mg Oral TID WC   insulin  aspart  0-5 Units Subcutaneous QHS   insulin  aspart  0-6 Units Subcutaneous TID WC   lidocaine   2 patch Transdermal Q24H   methocarbamol   500 mg Oral TID   midodrine   10 mg Oral TID WC   midodrine   10 mg Oral Q T,Th,Sa-HD   pantoprazole   40 mg Oral Q1200   polyethylene glycol  17 g Oral Daily   sevelamer  carbonate  800 mg Oral TID WC

## 2024-01-10 NOTE — Plan of Care (Signed)

## 2024-01-10 NOTE — Progress Notes (Signed)
   01/10/24 1650  Vitals  Temp 97.8 F (36.6 C)  Pulse Rate 84  Resp 10  BP 106/75  SpO2 100 %  O2 Device Room Air  Weight 41 kg  Type of Weight Post-Dialysis  Oxygen Therapy  Patient Activity (if Appropriate) In bed  Post Treatment  Dialyzer Clearance Lightly streaked  Hemodialysis Intake (mL) 100 mL  Liters Processed 72  Fluid Removed (mL) 0 mL  Tolerated HD Treatment Yes   Received patient in bed to unit.  Alert and oriented.  Informed consent signed and in chart.   TX duration: 3HRS  Patient tolerated well.  Transported back to the room  Alert, without acute distress.  Hand-off given to patient's nurse.   Access used: [facility] Access issues: NONE  Total UF removed: 0 Medication(s) given: MIDODRINE , VANC   [facility]

## 2024-01-10 NOTE — Progress Notes (Signed)
       Still have not yielded an organism but vancomycin  should cover what is growing as well as his prior known MRSA  He has an appt with me on 01/27/2024  I will followup his final cultures but otherwise sign off for now.  [name] 01/10/2024, 1:03 PM

## 2024-01-11 LAB — GLUCOSE, CAPILLARY
Glucose-Capillary: 110 mg/dL — ABNORMAL HIGH (ref 70–99)
Glucose-Capillary: 111 mg/dL — ABNORMAL HIGH (ref 70–99)
Glucose-Capillary: 129 mg/dL — ABNORMAL HIGH (ref 70–99)
Glucose-Capillary: 124 mg/dL — ABNORMAL HIGH (ref 70–99)
Glucose-Capillary: 129 mg/dL — ABNORMAL HIGH (ref 70–99)

## 2024-01-11 NOTE — Plan of Care (Signed)

## 2024-01-11 NOTE — Progress Notes (Signed)
      INFECTIOUS DISEASE ATTENDING ADDENDUM:   Date: 01/11/2024  Patient name: Alvin Daniels  Medical record number: [id number]  Date of birth: 1970-09-02   Cultures are finalized and yielded MRSA.  Will continue vancomycin  as previously prescribed  He has follow-up visit with me scheduled for September 16.  I will sign off please call with further questions.   [name] 01/11/2024, 1:15 PM

## 2024-01-11 NOTE — Progress Notes (Signed)
 PROGRESS NOTE        PATIENT DETAILS Name: Alvin Daniels Age: 53 y.o. Sex: male Date of Birth: Sep 09, 1970 Admit Date: 12/14/2023 Admitting Physician Marsha Ada, MD PCP:Pcp, No  Brief Summary: Patient is a 53 y.o.  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  Significant events: 8/4>> admit to [facility]  Significant studies: 6/4>> TEE: EF 60-65%, 1.8 x 1.2 mass right atrial wall-?vegetation from old HD catheter impinged on RA wall-versus mass/tumor. 8/4>> MRI pelvis: Discitis/osteomyelitis L4-5-right psoas abscess. 8/5>> CT abdomen/pelvis: Discitis/osteomyelitis L4-5 with paraspinal inflammatory changes/right psoas abscess.  8/8>> MRI LS spine: L4-5 discitis/osteomyelitis with associated paraspinal inflammatory changes, right psoas abscess. 8/20>> MRI left hip: Discitis of the lower lumbar spine with extension into the psoas muscle. 8/20>> BKA-no significant abnormality.  Significant microbiology data: 5/28>> blood culture: MRSA 5/29>> left finger abscess: E. coli/Streptococcus anginosus, Bacteroides fragilis. 8/4>> blood culture: No growth  Procedures: None.  Consults: ID Nephrology Ophthalmology  Subjective: Patient in bed, appears comfortable, denies any headache, no fever, no chest pain or pressure, no shortness of breath , no abdominal pain. No focal weakness.  Continues to have chronic left stump pain likely referred pain from his spine disease and psoas abscess.   Objective: Vitals: Blood pressure 106/75, pulse 85, temperature (!) 97.4 F (36.3 C), temperature source Oral, resp. rate 16, height 4' 11 (1.499 m), weight 41 kg, SpO2 99%.   Exam:  Awake Alert, No new F.N  deficits, Normal affect [place].AT,PERRAL Supple Neck, No JVD,   Symmetrical Chest wall movement, Good air movement bilaterally, CTAB RRR,No Gallops, Rubs or new Murmurs,  +ve B.Sounds, Abd Soft, No tenderness,   Bilateral BKA, right IJ HD catheter   Assessment/Plan:  L4-L5 discitis with surrounding paraspinal inflammatory change and right psoas abscess Recent history of MRSA bacteremia/left fourth finger osteomyelitis May 2025 (strep/Bacteroides/E. coli on bone culture) Likely MRSA-although cultures this admission negative.   Evaluated by ID-recommendations are for vancomycin /cefepime  x 8 weeks-EOT 9/29 Reviewed prior notes-not a surgical candidate-IR unable to drain due to lack of safe window. Pain is in better control Repeat MRI on 01/05/2024 noted with progression of disease, ID following they have recommended IR to do a biopsy for guided sensitivity and culture, disc biopsy by IR Dr. Johann on 01/07/2024.  Results from the biopsy so far growing Staphylococcus on antibiotics as above and ID following.  Syncope on 8/20 Probably related to soft blood pressure/orthostatic hypotension Telemetry monitoring unremarkable.  Chronic hypotension On midodrine  Narcotics for back pain very not helping. Minimize narcotics as much as possible-avoid further escalation in narcotic dosing.  ESRD on HD MWF Nephrology following.  Normocytic anemia Secondary to ESRD Hb stable Aranesp /iron  defer to nephrology service Follow  PAD-s/p bilateral BKA Normally uses prosthesis Due to severe pain-he has been unable to ambulate with a prosthesis.  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  Difficult disposition No family-uninsured-SNF not a  option per SW-history of ESRD with bilateral BKA-currently unable to ambulate with his prostheses-hence unable to be discharged home safely.   Normally-prior to this hospitalization was able to ambulate with prosthesis and catch the bus/scat transport to go back and forth from dialysis center.   DM-2 (A1c 10.0 on 3/6) CBG stable SSI  Recent Labs    01/10/24 2314 01/11/24 0635 01/11/24 0725  GLUCAP 130* 110* 111*        Code status:   Code Status: Full Code   DVT Prophylaxis: heparin  injection 5,000 Units Start: 12/15/23 1400   Family Communication: None at bedside   Disposition Plan: Status is: Inpatient Remains inpatient appropriate because: Severe of illness   Planned Discharge Destination:Home health   Diet: Diet Order             Diet renal with fluid restriction Fluid restriction: 1200 mL Fluid; Room service appropriate? Yes; Fluid consistency: Thin  Diet effective now                   I have personally reviewed following labs and imaging studies  LABORATORY DATA:   Data Review:   Inpatient Medications  Scheduled Meds:  acetaminophen   1,000 mg Oral TID   artificial tears   Right Eye QID   bictegravir-emtricitabine -tenofovir  AF  1 tablet Oral Daily   Chlorhexidine  Gluconate Cloth  6 each Topical Q0600   darbepoetin (ARANESP ) injection - DIALYSIS  100 mcg Subcutaneous Q Fri-1800   fentaNYL   1 patch Transdermal Q72H   gabapentin   200 mg Oral TID   gatifloxacin   1 drop Right Eye BID   heparin   5,000 Units Subcutaneous Q8H   ibuprofen   600 mg Oral TID WC   insulin  aspart  0-5 Units Subcutaneous QHS   insulin  aspart  0-6 Units Subcutaneous TID WC   lidocaine   2 patch Transdermal Q24H   methocarbamol   500 mg Oral TID   midodrine   10 mg Oral TID WC   midodrine   10 mg Oral Q T,Th,Sa-HD   pantoprazole   40 mg Oral Q1200   polyethylene glycol  17 g Oral Daily   sevelamer  carbonate  800  mg Oral TID WC   Continuous Infusions:  vancomycin  Stopped (01/10/24 1719)   PRN Meds:.glucagon  (human recombinant), hydrALAZINE , ipratropium-albuterol , metoprolol  tartrate, ondansetron  (ZOFRAN ) IV, oxyCODONE , sodium chloride  flush  DVT Prophylaxis  heparin  injection 5,000 Units Start: 12/15/23 1400     Recent Labs  Lab 01/05/24 0554 01/08/24 1023  WBC 8.3 7.8  HGB 9.8* 8.5*  HCT 30.7* 26.7*  PLT 253 266  MCV 100.3* 98.5  MCH 32.0 31.4  MCHC 31.9 31.8  RDW 16.1* 16.5*  LYMPHSABS 1.6  --   MONOABS 0.7  --   EOSABS 0.1  --   BASOSABS 0.1  --     Recent Labs  Lab 01/05/24 0554 01/07/24 1226 01/08/24 1037  NA 130*  --  131*  K 4.5  --  4.8  CL 93*  --  93*  CO2 22  --  21*  ANIONGAP 15  --  17*  GLUCOSE 158*  --  104*  BUN 28*  --  33*  CREATININE 5.31*  --  5.53*  ALBUMIN   --   --  2.5*  CRP 16.1*  --   --   PROCALCITON 15.67  --   --   INR  --  1.1  --   PHOS 3.9  --  4.0  CALCIUM  9.5  --  9.4  Recent Labs  Lab 01/05/24 0554 01/07/24 1226 01/08/24 1037  CRP 16.1*  --   --   PROCALCITON 15.67  --   --   INR  --  1.1  --   CALCIUM  9.5  --  9.4    --------------------------------------------------------------------------------------------------------------- Lab Results  Component Value Date   CHOL 166 04/16/2023   HDL 40 04/16/2023   LDLCALC 90 04/16/2023   TRIG 277 (H) 04/16/2023   CHOLHDL 4.2 04/16/2023    Lab Results  Component Value Date   HGBA1C 10.0 (A) 07/17/2023      Signature  -   Lavada Stank M.D on 01/11/2024 at 7:54 AM   -  To page go to [email or web address]

## 2024-01-11 NOTE — Progress Notes (Signed)
 [facility] Progress Note   Dialysis Orders: [facility]-MWF 3.5h  B400  45.5kg  TDC  Heparin  2000 Mircera 100 mcg - last dose 12/01/23 Calcitriol  1.0 mcg (on hold due to high Ca)   Home bp meds: Midodrine  10mg  tid prn for SBP < 100 Midodrine  10mg  pre HD mwf prn   Assessment/Plan: 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 Not a surgical candidate. Blood cxs ng. Plan for IV Cefepime  2g (last dose on 8/26, discontinue 8/28 by ID and IV Vancomycin  500mg  with HD X 8 weeks end date 02/09/24 -> Vancomycin  for 6 more weeks starting 8/27 last dose on 10/8). Pain management per primary team.   ESRD: Usual HD MWF.  Off schedule but will continue TTS schedule for now while he is in the hospital.   Tolerated dialysis Saturday and kept him even as he is not eating very much and appears euvolemic on examination.  BP always low on dialysis.  BP: chronic low BP on midodrine  10 tid prn and 10mg  pre HD mwf as needed for SBP < 100. Can give albumin  during HD for low BP.  Volume:  UF as tolerated. Has lost weight. Now under EDW. Will need EDW changed at d/c.  S/p L4-5 D on 01/08/2024, 3 cc of bloody fluid  ABUNDANT WBC PRESENT, PREDOMINANTLY PMN  RARE GRAM POSITIVE COCCI   Anemia of esrd: On Aranesp  100 q week.  No IV iron  due to infection.  HIV: controlled on Biktarvy  BMD:  Continue sevelamer  TID WC. VDRA on hold. Continue to trend Ca; P3.9 8/25 Nutrition - renal diet w/fluid restrictions. Debility: PT has requested that we schedule him for 2nd shift whenever we can.   Subjective:  Good spirits this morning states that back pain better but having pain in his left stump/leg.  He is not really eating much but ppl are bringing him food at night.  Denies fever, chills, shortness of breath, chest pain.  Rated dialysis on Saturday with no cramping.  Objective Vitals:   01/10/24 1918 01/10/24 2000 01/11/24 0600 01/11/24 0721  BP:      Pulse: 87  80 85  Resp: 16  16 16    Temp: (!) 97.5 F (36.4 C)  97.8 F (36.6 C) (!) 97.4 F (36.3 C)  TempSrc: Oral Oral Oral Oral  SpO2: 100%  98% 99%  Weight:      Height:       Physical Exam General: Alert, nad  Heart: RRR  Lungs: nml WOB on RA Abdomen: soft, ND Extremities: b/l BKA, no edema Dialysis Access: Right IJ TDC, thrombosed left arm aneurysmal brachiocephalic fistula which is pulsatile inflow (left BCF was placed 05/10/2016)  Filed Weights   01/08/24 0953 01/10/24 1334 01/10/24 1650  Weight: 38.9 kg 41 kg 41 kg    Intake/Output Summary (Last 24 hours) at 01/11/2024 0745 Last data filed at 01/10/2024 1650 Gross per 24 hour  Intake --  Output 0 ml  Net 0 ml      Additional Objective Labs: Basic Metabolic Panel: Recent Labs  Lab 01/05/24 0554 01/08/24 1037  NA 130* 131*  K 4.5 4.8  CL 93* 93*  CO2 22 21*  GLUCOSE 158* 104*  BUN 28* 33*  CREATININE 5.31* 5.53*  CALCIUM  9.5 9.4  PHOS 3.9 4.0    CBC: Recent Labs  Lab 01/05/24 0554 01/08/24 1023  WBC 8.3 7.8  NEUTROABS 5.8  --   HGB 9.8* 8.5*  HCT 30.7* 26.7*  MCV 100.3*  98.5  PLT 253 266    Medications:  vancomycin  Stopped (01/10/24 1719)    acetaminophen   1,000 mg Oral TID   artificial tears   Right Eye QID   bictegravir-emtricitabine -tenofovir  AF  1 tablet Oral Daily   Chlorhexidine  Gluconate Cloth  6 each Topical Q0600   darbepoetin (ARANESP ) injection - DIALYSIS  100 mcg Subcutaneous Q Fri-1800   fentaNYL   1 patch Transdermal Q72H   gabapentin   200 mg Oral TID   gatifloxacin   1 drop Right Eye BID   heparin   5,000 Units Subcutaneous Q8H   ibuprofen   600 mg Oral TID WC   insulin  aspart  0-5 Units Subcutaneous QHS   insulin  aspart  0-6 Units Subcutaneous TID WC   lidocaine   2 patch Transdermal Q24H   methocarbamol   500 mg Oral TID   midodrine   10 mg Oral TID WC   midodrine   10 mg Oral Q T,Th,Sa-HD   pantoprazole   40 mg Oral Q1200   polyethylene glycol  17 g Oral Daily   sevelamer  carbonate  800 mg Oral TID WC

## 2024-01-12 LAB — AEROBIC/ANAEROBIC CULTURE W GRAM STAIN (SURGICAL/DEEP WOUND)

## 2024-01-12 LAB — GLUCOSE, CAPILLARY
Glucose-Capillary: 101 mg/dL — ABNORMAL HIGH (ref 70–99)
Glucose-Capillary: 109 mg/dL — ABNORMAL HIGH (ref 70–99)
Glucose-Capillary: 123 mg/dL — ABNORMAL HIGH (ref 70–99)
Glucose-Capillary: 126 mg/dL — ABNORMAL HIGH (ref 70–99)

## 2024-01-12 MED ORDER — IBUPROFEN 200 MG PO TABS
600.0000 mg | ORAL_TABLET | Freq: Three times a day (TID) | ORAL | Status: DC | PRN
  Administered 2024-01-15 – 2024-02-19 (×10): 600 mg via ORAL
  Filled 2024-01-12 (×4): qty 1
  Filled 2024-01-12 (×2): qty 3
  Filled 2024-01-12 (×4): qty 1
  Filled 2024-01-12: qty 3

## 2024-01-12 MED ORDER — PANTOPRAZOLE SODIUM 40 MG PO TBEC
40.0000 mg | DELAYED_RELEASE_TABLET | Freq: Two times a day (BID) | ORAL | Status: DC
  Administered 2024-01-12 – 2024-02-21 (×69): 40 mg via ORAL
  Filled 2024-01-12 (×69): qty 1

## 2024-01-12 NOTE — Plan of Care (Signed)
   Problem: Education: Goal: Knowledge of General Education information will improve Description: Including pain rating scale, medication(s)/side effects and non-pharmacologic comfort measures Outcome: Progressing   Problem: Safety: Goal: Ability to remain free from injury will improve Outcome: Progressing

## 2024-01-12 NOTE — Progress Notes (Signed)
 [facility] Progress Note   Subjective:  Seen in room - feels ok today. Pain is a little better. For HD tomorrow.  Objective Vitals:   01/11/24 2000 01/12/24 0005 01/12/24 0502 01/12/24 0754  BP: 101/77 108/78 105/66 90/63  Pulse: 70 95 79 63  Resp: 16     Temp: 97.9 F (36.6 C) 97.7 F (36.5 C) 97.9 F (36.6 C)   TempSrc: Oral Oral Oral Oral  SpO2: 98%     Weight:      Height:       Physical Exam General: Frail man, pleasant, NAD. Room air Heart: RRR Lungs: CTAB Abdomen: soft Extremities: B BKA, no stump erythema Dialysis Access: TDC in R chest  Additional Objective Labs: Basic Metabolic Panel: Recent Labs  Lab 01/08/24 1037  NA 131*  K 4.8  CL 93*  CO2 21*  GLUCOSE 104*  BUN 33*  CREATININE 5.53*  CALCIUM  9.4  PHOS 4.0   Liver Function Tests: Recent Labs  Lab 01/08/24 1037  ALBUMIN  2.5*   CBC: Recent Labs  Lab 01/08/24 1023  WBC 7.8  HGB 8.5*  HCT 26.7*  MCV 98.5  PLT 266   Blood Culture    Component Value Date/Time   SDES NEEDLE ASPIRATE 01/07/2024 1612   SPECREQUEST DISC 01/07/2024 1612   CULT  01/07/2024 1612    FEW METHICILLIN RESISTANT STAPHYLOCOCCUS AUREUS NO ANAEROBES ISOLATED; CULTURE IN PROGRESS FOR 5 DAYS    REPTSTATUS PENDING 01/07/2024 1612   Medications:  vancomycin  Stopped (01/10/24 1719)    acetaminophen   1,000 mg Oral TID   artificial tears   Right Eye QID   bictegravir-emtricitabine -tenofovir  AF  1 tablet Oral Daily   Chlorhexidine  Gluconate Cloth  6 each Topical Q0600   darbepoetin (ARANESP ) injection - DIALYSIS  100 mcg Subcutaneous Q Fri-1800   fentaNYL   1 patch Transdermal Q72H   gabapentin   200 mg Oral TID   gatifloxacin   1 drop Right Eye BID   heparin   5,000 Units Subcutaneous Q8H   ibuprofen   600 mg Oral TID WC   insulin  aspart  0-5 Units Subcutaneous QHS   insulin  aspart  0-6 Units Subcutaneous TID WC   lidocaine   2 patch Transdermal Q24H   methocarbamol   500 mg Oral TID   midodrine   10  mg Oral TID WC   midodrine   10 mg Oral Q T,Th,Sa-HD   pantoprazole   40 mg Oral Q1200   polyethylene glycol  17 g Oral Daily   sevelamer  carbonate  800 mg Oral TID WC    Dialysis Orders [facility] - MWF 3.5h  B400  45.5kg  TDC  Heparin  2000 Mircera 100 mcg - last dose 12/01/23 Calcitriol  1.0 mcg (on hold due to high Ca) Uses midodrine  10mg  TID + extra pre-HD    Assessment/Plan: 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 S/p IR aspiration 8/27 - growing MRSA. Not a surgical candidate. Pain management per primary.   ESRD: Historically MWF schedule but following TTS here and wishes to stay on this schedule for now. Next HD 9/2.   BP/volume: Chronic hypotension on mido 10mg  TID and extra pre-HD. No LE edema. Below prior EDW. Anemia of ESRD: Hgb 8.5 - getting Aranesp  100 q week, will ^ next dose. No IV iron  d/t infection. HIV: controlled on Biktarvy  BMD: CorrCa high, VDRA on hold. Phos stable, on sevelamer  as binder.  Nutrition: Alb low, adding supps and continue renal diet w/fluid restrictions.  Debility: PT has requested that we schedule him for 2nd shift whenever we can.      Izetta Boehringer, PA-C 01/12/2024, 10:50 AM  [employer]

## 2024-01-12 NOTE — Plan of Care (Signed)

## 2024-01-12 NOTE — Progress Notes (Signed)
 Physical Therapy Treatment Patient Details Name: Alvin Daniels MRN: [id number] DOB: 1970/08/14 Today's Date: 01/12/2024   History of Present Illness Patient is a 53 y/o male admitted 12/14/23 with hip pain MRI showing R psoas abscess and L4-L5 discitis and osteomyelitis. 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  PMH includes ESRD on HD MWF, hepC, MRSA, HIV, bilat BKA, DMII.    PT Comments  Video interpreters utilized throughout session. Despite being premedicated for pain, pt continued to report severe L leg pain. He was unable to tolerate trying to take steps to ambulate due to the pain. Extended period of time spent reaching out to MD in regards to his pain meds reportedly being ineffective per pt and educating pt on MD's response that it would unsafe to change or increase pain meds due to pt's low BP and thus a need to try to push through his pain or manage it another way. Pt agreeable to donning prostheses (x2 socks on L made it difficult to click prosthesis into place, x1 sock was almost too easy though; pt declined sock on the R) and attempting to stand x4 reps. He needed CGA-minA for balance with transfers due to his flexed posture and attempt to offweight his L leg by heavily relying on his arms and R leg. Performed several seated lower extremity exercises to help maintain his strength and stabilize his joints to try to reduce pain. Provided pt with ice to try to help manage the pain after the session. Will continue to follow acutely. Hopefully his pain will improve soon to allow him to safely return home with HHPT follow-up.    If plan is discharge home, recommend the following: A little help with walking and/or transfers;Help with stairs or ramp for entrance;Assist for transportation;A little help with bathing/dressing/bathroom;Assistance with cooking/housework   Can travel by private vehicle         Equipment Recommendations  BSC/3in1    Recommendations for Other Services       Precautions / Restrictions Precautions Precautions: Fall Recall of Precautions/Restrictions: Impaired Precaution/Restrictions Comments: B BKA; prosthetics in the room Restrictions Weight Bearing Restrictions Per Provider Order: No     Mobility  Bed Mobility Overal bed mobility: Modified Independent Bed Mobility: Supine to Sit     Supine to sit: Modified independent (Device/Increase time), Used rails, HOB elevated     General bed mobility comments: Pt utilized bed rail to pull up to sit L EOB. Pt tends to lean laterally to his R hip to offload his painful L leg    Transfers Overall transfer level: Needs assistance Equipment used: Rolling walker (2 wheels) Transfers: Sit to/from Stand Sit to Stand: Contact guard assist, Min assist           General transfer comment: Bil prosthetics donned (L with 1-2 socks, R without socks) and pt practiced standing from EOB x4 reps. Each rep, pt maintained a flexed posture, trying to offload his L leg via use of his UEs on the RW and his R leg. Pt varied in needing CGA-minA for balance with transfers    Ambulation/Gait               General Gait Details: pt declined attempts despite encouragement and cues due to L leg pain   Stairs             Wheelchair Mobility     Tilt Bed    Modified Rankin (Stroke Patients Only)  Balance Overall balance assessment: Needs assistance Sitting-balance support: Feet unsupported, No upper extremity supported Sitting balance-Leahy Scale: Good     Standing balance support: Bilateral upper extremity supported, Reliant on assistive device for balance, During functional activity Standing balance-Leahy Scale: Poor Standing balance comment: reliant on RW                            Communication Communication Communication: No apparent difficulties;Other (comment) Factors Affecting  Communication: Non - English speaking, interpreter not available (Video interpreter [name] [phone] utilized during session)  Cognition Arousal: Alert Behavior During Therapy: WFL for tasks assessed/performed   PT - Cognitive impairments: Safety/Judgement                       PT - Cognition Comments: Per discussion with MD, pt has asked about increasing his pain meds many times and has been informed it would be unsafe to do so due to low BP. Pt with poor insight into inability to alter pain meds and thus likely need to push through some of the pain Following commands: Intact      Cueing Cueing Techniques: Verbal cues, Visual cues, Tactile cues  Exercises General Exercises - Lower Extremity Long Arc Quad: AROM, Strengthening, Both, Other reps (comment), Seated (x30, cues for isometric hold at full extension but pt only holding x5 reps) Hip ABduction/ADduction: AROM, Strengthening, Both, 10 reps, Seated, Other (comment) (against red theraband) Hip Flexion/Marching: AROM, Strengthening, Both, 20 reps, Seated (x10 reps against red theraband)    General Comments General comments (skin integrity, edema, etc.): Educated pt on importance of trying to push through the pain and manage the pain with ice, elevation, and exercises as pt is unable to safely get more or different pain meds (after discussion with MD).      Pertinent Vitals/Pain Pain Assessment Pain Assessment: 0-10 Pain Score: 7  Pain Location: L hip and leg Pain Descriptors / Indicators: Discomfort, Sore, Grimacing Pain Intervention(s): Monitored during session, Limited activity within patient's tolerance, Premedicated before session, Repositioned, Ice applied    Home Living                          Prior Function            PT Goals (current goals can now be found in the care plan section) Acute Rehab PT Goals Patient Stated Goal: to improve pain to walk PT Goal Formulation: With patient Time For Goal  Achievement: 01/18/24 Potential to Achieve Goals: Good Progress towards PT goals: Progressing toward goals    Frequency    Min 2X/week      PT Plan      Co-evaluation              AM-PAC PT 6 Clicks Mobility   Outcome Measure  Help needed turning from your back to your side while in a flat bed without using bedrails?: None Help needed moving from lying on your back to sitting on the side of a flat bed without using bedrails?: None Help needed moving to and from a bed to a chair (including a wheelchair)?: A Little Help needed standing up from a chair using your arms (e.g., wheelchair or bedside chair)?: A Little Help needed to walk in hospital room?: Total Help needed climbing 3-5 steps with a railing? : Total 6 Click Score: 16    End of Session Equipment Utilized During Treatment:  Gait belt Activity Tolerance: Patient limited by pain Patient left: in bed;with call bell/phone within reach;with bed alarm set;Other (comment) (sitting EOB)   PT Visit Diagnosis: Pain;Other abnormalities of gait and mobility (R26.89);Unsteadiness on feet (R26.81);Muscle weakness (generalized) (M62.81);Difficulty in walking, not elsewhere classified (R26.2) Pain - Right/Left: Left Pain - part of body: Hip;Leg     Time: 1220-1303 PT Time Calculation (min) (ACUTE ONLY): 43 min  Charges:    $Therapeutic Exercise: 8-22 mins $Therapeutic Activity: 23-37 mins PT General Charges $$ ACUTE PT VISIT: 1 Visit                     Theo Ferretti, PT, DPT Acute Rehabilitation Services  Office: [phone]    Theo CHRISTELLA Ferretti 01/12/2024, 3:19 PM

## 2024-01-12 NOTE — Progress Notes (Signed)
 PROGRESS NOTE        PATIENT DETAILS Name: Alvin Daniels Age: 53 y.o. Sex: male Date of Birth: 1970-12-21 Admit Date: 12/14/2023 Admitting Physician Marsha Ada, MD PCP:Pcp, No  Brief Summary: Patient is a 53 y.o.  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  Significant events: 8/4>> admit to [facility]  Significant studies: 6/4>> TEE: EF 60-65%, 1.8 x 1.2 mass right atrial wall-?vegetation from old HD catheter impinged on RA wall-versus mass/tumor. 8/4>> MRI pelvis: Discitis/osteomyelitis L4-5-right psoas abscess. 8/5>> CT abdomen/pelvis: Discitis/osteomyelitis L4-5 with paraspinal inflammatory changes/right psoas abscess.  8/8>> MRI LS spine: L4-5 discitis/osteomyelitis with associated paraspinal inflammatory changes, right psoas abscess. 8/20>> MRI left hip: Discitis of the lower lumbar spine with extension into the psoas muscle. 8/20>> BKA-no significant abnormality.  Significant microbiology data: 5/28>> blood culture: MRSA 5/29>> left finger abscess: E. coli/Streptococcus anginosus, Bacteroides fragilis. 8/4>> blood culture: No growth  Procedures: None.  Consults: ID Nephrology Ophthalmology  Subjective: Patient in bed, appears comfortable, denies any headache, no fever, no chest pain or pressure, no shortness of breath , no abdominal pain. No focal weakness.  Continues to have chronic left stump pain likely referred pain from his spine disease and psoas abscess.   Objective: Vitals: Blood pressure 105/66, pulse 79, temperature 97.9 F (36.6 C), temperature source Oral, resp. rate 16, height 4' 11 (1.499 m), weight 41 kg, SpO2 98%.   Exam:  Awake Alert, No new F.N deficits,  Normal affect [place].AT,PERRAL Supple Neck, No JVD,   Symmetrical Chest wall movement, Good air movement bilaterally, CTAB RRR,No Gallops, Rubs or new Murmurs,  +ve B.Sounds, Abd Soft, No tenderness,   Bilateral BKA, right IJ HD catheter   Assessment/Plan:  L4-L5 discitis with surrounding paraspinal inflammatory change and right psoas abscess Recent history of MRSA bacteremia/left fourth finger osteomyelitis May 2025 (strep/Bacteroides/E. coli on bone culture) Likely MRSA-although cultures this admission negative.   Evaluated by ID-recommendations are for vancomycin /cefepime  x 8 weeks-EOT 9/29 Reviewed prior notes-not a surgical candidate-IR unable to drain due to lack of safe window. Pain is in better control Repeat MRI on 01/05/2024 noted with progression of disease, ID following they have recommended IR to do a biopsy for guided sensitivity and culture, disc biopsy by IR Dr. Johann on 01/07/2024.  Results from the biopsy so far growing MRSA -   Per ID  Will continue vancomycin  as previously prescribed   He has follow-up visit with me scheduled for September 16.   [name]  01/11/2024, 1:15 PM     Syncope on 8/20 Probably related to soft blood pressure/orthostatic hypotension Telemetry monitoring unremarkable.  Chronic hypotension On midodrine  Narcotics for back pain very not helping. Minimize narcotics as much as possible-avoid further escalation in narcotic dosing.  ESRD on HD MWF Nephrology following.  Normocytic anemia Secondary to ESRD Hb stable Aranesp /iron  defer to nephrology service Follow  PAD-s/p bilateral BKA Normally uses prosthesis Due to severe pain-he has been unable to ambulate with a prosthesis.  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  Difficult disposition No family-uninsured-SNF not a option per SW-history of ESRD with bilateral BKA-currently unable to ambulate with his prostheses-hence unable to be discharged home safely.   Normally-prior to this hospitalization was able to ambulate with prosthesis and catch the bus/scat transport to go back and forth from dialysis center.   DM-2 (A1c 10.0 on 3/6) CBG stable SSI  Recent Labs    01/11/24 1129 01/11/24 1546 01/11/24 2105  GLUCAP 129* 124* 129*        Code status:   Code Status: Full Code   DVT Prophylaxis: heparin  injection 5,000 Units Start: 12/15/23 1400   Family Communication: None at bedside   Disposition Plan: Status is: Inpatient Remains inpatient appropriate because: Severe of illness   Planned Discharge Destination:Home health   Diet: Diet Order             Diet renal with fluid restriction Fluid restriction: 1200 mL Fluid; Room service appropriate? Yes; Fluid consistency: Thin  Diet effective now                   I have personally reviewed following labs and imaging studies  LABORATORY DATA:   Data Review:   Inpatient Medications  Scheduled Meds:  acetaminophen   1,000 mg Oral TID   artificial tears   Right Eye QID   bictegravir-emtricitabine -tenofovir  AF  1 tablet Oral Daily   Chlorhexidine  Gluconate Cloth  6 each Topical Q0600   darbepoetin (ARANESP ) injection - DIALYSIS  100 mcg Subcutaneous Q Fri-1800   fentaNYL   1 patch Transdermal Q72H   gabapentin   200 mg Oral TID   gatifloxacin   1 drop Right Eye BID   heparin   5,000 Units Subcutaneous Q8H   ibuprofen   600 mg Oral TID WC   insulin  aspart  0-5 Units Subcutaneous QHS   insulin  aspart  0-6 Units Subcutaneous TID WC   lidocaine   2 patch Transdermal Q24H   methocarbamol   500 mg Oral TID   midodrine   10 mg Oral TID WC   midodrine   10 mg  Oral Q T,Th,Sa-HD   pantoprazole   40 mg Oral Q1200   polyethylene glycol  17 g Oral Daily   sevelamer  carbonate  800 mg Oral TID WC   Continuous Infusions:  vancomycin  Stopped (01/10/24 1719)   PRN Meds:.glucagon  (human recombinant), hydrALAZINE , ipratropium-albuterol , metoprolol  tartrate, ondansetron  (ZOFRAN ) IV, oxyCODONE , sodium chloride  flush  DVT Prophylaxis  heparin  injection 5,000 Units Start: 12/15/23 1400     Recent Labs  Lab 01/08/24 1023  WBC 7.8  HGB 8.5*  HCT 26.7*  PLT 266  MCV 98.5  MCH 31.4  MCHC 31.8  RDW 16.5*    Recent Labs  Lab 01/07/24 1226 01/08/24 1037  NA  --  131*  K  --  4.8  CL  --  93*  CO2  --  21*  ANIONGAP  --  17*  GLUCOSE  --  104*  BUN  --  33*  CREATININE  --  5.53*  ALBUMIN   --  2.5*  INR 1.1  --   PHOS  --  4.0  CALCIUM   --  9.4      Recent Labs  Lab 01/07/24 1226 01/08/24 1037  INR 1.1  --   CALCIUM   --  9.4    --------------------------------------------------------------------------------------------------------------- Lab Results  Component Value Date   CHOL 166 04/16/2023   HDL  40 04/16/2023   LDLCALC 90 04/16/2023   TRIG 277 (H) 04/16/2023   CHOLHDL 4.2 04/16/2023    Lab Results  Component Value Date   HGBA1C 10.0 (A) 07/17/2023      Signature  -   Lavada Stank M.D on 01/12/2024 at 7:49 AM   -  To page go to [email or web address]

## 2024-01-12 DEATH — deceased

## 2024-01-13 ENCOUNTER — Encounter (HOSPITAL_COMMUNITY): Payer: Self-pay

## 2024-01-13 LAB — RENAL FUNCTION PANEL
Albumin: 2.4 g/dL — ABNORMAL LOW (ref 3.5–5.0)
Anion gap: 19 — ABNORMAL HIGH (ref 5–15)
BUN: 38 mg/dL — ABNORMAL HIGH (ref 6–20)
CO2: 21 mmol/L — ABNORMAL LOW (ref 22–32)
Calcium: 9.3 mg/dL (ref 8.9–10.3)
Chloride: 92 mmol/L — ABNORMAL LOW (ref 98–111)
Creatinine, Ser: 5.81 mg/dL — ABNORMAL HIGH (ref 0.61–1.24)
GFR, Estimated: 11 mL/min — ABNORMAL LOW (ref >60)
Glucose, Bld: 121 mg/dL — ABNORMAL HIGH (ref 70–99)
Phosphorus: 3.8 mg/dL (ref 2.5–4.6)
Potassium: 4.3 mmol/L (ref 3.5–5.1)
Sodium: 132 mmol/L — ABNORMAL LOW (ref 135–145)

## 2024-01-13 LAB — GLUCOSE, CAPILLARY
Glucose-Capillary: 104 mg/dL — ABNORMAL HIGH (ref 70–99)
Glucose-Capillary: 148 mg/dL — ABNORMAL HIGH (ref 70–99)
Glucose-Capillary: 121 mg/dL — ABNORMAL HIGH (ref 70–99)

## 2024-01-13 LAB — CBC
HCT: 28.6 % — ABNORMAL LOW (ref 39.0–52.0)
Hemoglobin: 9 g/dL — ABNORMAL LOW (ref 13.0–17.0)
MCH: 31.6 pg (ref 26.0–34.0)
MCHC: 31.5 g/dL (ref 30.0–36.0)
MCV: 100.4 fL — ABNORMAL HIGH (ref 80.0–100.0)
Platelets: 282 K/uL (ref 150–400)
RBC: 2.85 MIL/uL — ABNORMAL LOW (ref 4.22–5.81)
RDW: 16.7 % — ABNORMAL HIGH (ref 11.5–15.5)
WBC: 8.3 K/uL (ref 4.0–10.5)
nRBC: 0 % (ref 0.0–0.2)

## 2024-01-13 LAB — VANCOMYCIN, RANDOM: Vancomycin Rm: 18 ug/mL

## 2024-01-13 MED ORDER — PROSOURCE PLUS PO LIQD
30.0000 mL | Freq: Two times a day (BID) | ORAL | Status: DC
  Administered 2024-01-13 – 2024-01-25 (×17): 30 mL via ORAL
  Filled 2024-01-13 (×20): qty 30

## 2024-01-13 MED ORDER — HEPARIN SODIUM (PORCINE) 1000 UNIT/ML IJ SOLN
INTRAMUSCULAR | Status: AC
  Filled 2024-01-13: qty 4

## 2024-01-13 MED ORDER — MIDODRINE HCL 5 MG PO TABS
ORAL_TABLET | ORAL | Status: AC
  Filled 2024-01-13: qty 2

## 2024-01-13 MED ORDER — OXYCODONE HCL 5 MG PO TABS
ORAL_TABLET | ORAL | Status: AC
  Filled 2024-01-13: qty 2

## 2024-01-13 NOTE — Progress Notes (Signed)
 [facility] Progress Note   Subjective:  Seen on HD - noted to have eyelid edema, so increased UF goal, but then mid-way through HD, he started yelling out with L thigh pain - UF off and fluid given, he insisted to end his HD early. Unclear if was cramp or pain related to his infection.  Objective Vitals:   01/13/24 0939 01/13/24 0945 01/13/24 1016 01/13/24 1017  BP: (!) 89/76 (!) 84/74 102/73 (!) 97/50  Pulse: 88 90 (!) 41 97  Resp: 15 17 18  (!) 27  Temp:    (!) 97.3 F (36.3 C)  TempSrc:      SpO2: 100% 100% 100% 90%  Weight:    41.2 kg  Height:       Physical Exam General: Frail man, pleasant, NAD. Room air Heart: RRR Lungs: CTAB Abdomen: soft Extremities: B BKA, no stump erythema Dialysis Access: TDC in R chest  Additional Objective Labs: Basic Metabolic Panel: Recent Labs  Lab 01/08/24 1037 01/13/24 0906  NA 131* 132*  K 4.8 4.3  CL 93* 92*  CO2 21* 21*  GLUCOSE 104* 121*  BUN 33* 38*  CREATININE 5.53* 5.81*  CALCIUM  9.4 9.3  PHOS 4.0 3.8   Liver Function Tests: Recent Labs  Lab 01/08/24 1037 01/13/24 0906  ALBUMIN  2.5* 2.4*   CBC: Recent Labs  Lab 01/08/24 1023 01/13/24 0850  WBC 7.8 8.3  HGB 8.5* 9.0*  HCT 26.7* 28.6*  MCV 98.5 100.4*  PLT 266 282   Blood Culture    Component Value Date/Time   SDES NEEDLE ASPIRATE 01/07/2024 1612   SPECREQUEST DISC 01/07/2024 1612   CULT  01/07/2024 1612    FEW METHICILLIN RESISTANT STAPHYLOCOCCUS AUREUS NO ANAEROBES ISOLATED Performed at [facility], [address] [place], [place] [address]    REPTSTATUS 01/12/2024 FINAL 01/07/2024 1612    Medications:  vancomycin  Stopped (01/10/24 1719)    acetaminophen   1,000 mg Oral TID   artificial tears   Right Eye QID   bictegravir-emtricitabine -tenofovir  AF  1 tablet Oral Daily   Chlorhexidine  Gluconate Cloth  6 each Topical Q0600   darbepoetin (ARANESP ) injection - DIALYSIS  100 mcg Subcutaneous Q Fri-1800   fentaNYL   1 patch  Transdermal Q72H   gabapentin   200 mg Oral TID   gatifloxacin   1 drop Right Eye BID   heparin   5,000 Units Subcutaneous Q8H   insulin  aspart  0-5 Units Subcutaneous QHS   insulin  aspart  0-6 Units Subcutaneous TID WC   lidocaine   2 patch Transdermal Q24H   methocarbamol   500 mg Oral TID   midodrine   10 mg Oral TID WC   midodrine   10 mg Oral Q T,Th,Sa-HD   pantoprazole   40 mg Oral BID AC   polyethylene glycol  17 g Oral Daily   sevelamer  carbonate  800 mg Oral TID WC    Dialysis Orders [facility] - MWF 3.5h  B400  45.5kg  TDC  Heparin  2000 Mircera 100 mcg - last dose 12/01/23 Calcitriol  1.0 mcg (on hold due to high Ca) Uses midodrine  10mg  TID + extra pre-HD    Assessment/Plan: 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 S/p IR aspiration 8/27 - growing MRSA. Not a surgical candidate. Pain management per primary.   ESRD: Historically MWF schedule but following TTS here and wishes to stay on this schedule for now. S/p HD today. BP/volume: Chronic hypotension on mido 10mg  TID and extra  pre-HD. No LE edema. Below prior EDW. Anemia of ESRD: Hgb 9 - getting Aranesp  100 q week, will ^ next dose. No IV iron  d/t infection. HIV: controlled on Biktarvy  BMD: CorrCa high, VDRA on hold. Phos stable, on sevelamer  as binder.  Nutrition: Alb low, adding supps and continue renal diet w/fluid restrictions. Debility: PT has requested that we schedule him for 2nd shift whenever we can.    Izetta Boehringer, PA-C 01/13/2024, 11:53 AM  [employer]

## 2024-01-13 NOTE — Progress Notes (Signed)
 PROGRESS NOTE        PATIENT DETAILS Name: Alvin Daniels Age: 53 y.o. Sex: male Date of Birth: 11/08/1970 Admit Date: 12/14/2023 Admitting Physician Marsha Ada, MD PCP:Pcp, No  Brief Summary: Patient is a 53 y.o.  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  Significant events: 8/4>> admit to [facility]  Significant studies: 6/4>> TEE: EF 60-65%, 1.8 x 1.2 mass right atrial wall-?vegetation from old HD catheter impinged on RA wall-versus mass/tumor. 8/4>> MRI pelvis: Discitis/osteomyelitis L4-5-right psoas abscess. 8/5>> CT abdomen/pelvis: Discitis/osteomyelitis L4-5 with paraspinal inflammatory changes/right psoas abscess.  8/8>> MRI LS spine: L4-5 discitis/osteomyelitis with associated paraspinal inflammatory changes, right psoas abscess. 8/20>> MRI left hip: Discitis of the lower lumbar spine with extension into the psoas muscle. 8/20>> BKA-no significant abnormality.  Significant microbiology data: 5/28>> blood culture: MRSA 5/29>> left finger abscess: E. coli/Streptococcus anginosus, Bacteroides fragilis. 8/4>> blood culture: No growth  Procedures: None.  Consults: ID Nephrology Ophthalmology  Subjective: Patient in bed, appears comfortable, denies any headache, no fever, no chest pain or pressure, no shortness of breath , no abdominal pain. No focal weakness.  Continues to have chronic left stump pain likely referred pain from his spine disease and psoas abscess.   Objective: Vitals: Blood pressure 105/71, pulse 74, temperature 98.8 F (37.1 C), temperature source Oral, resp. rate 18, height 4' 11 (1.499 m), weight 41 kg, SpO2 98%.   Exam:  Awake Alert, No new F.N deficits,  Normal affect [place].AT,PERRAL Supple Neck, No JVD,   Symmetrical Chest wall movement, Good air movement bilaterally, CTAB RRR,No Gallops, Rubs or new Murmurs,  +ve B.Sounds, Abd Soft, No tenderness,   Bilateral BKA, right IJ HD catheter   Assessment/Plan:  L4-L5 discitis with surrounding paraspinal inflammatory change and right psoas abscess Recent history of MRSA bacteremia/left fourth finger osteomyelitis May 2025 (strep/Bacteroides/E. coli on bone culture) Likely MRSA-although cultures this admission negative.   Evaluated by ID-recommendations are for Vancomycin  with HD x 8 weeks - EOT 9/29 Reviewed prior notes-not a surgical candidate-IR unable to drain due to lack of safe window. Repeat MRI on 01/05/2024 noted with progression of disease, ID following they have recommended IR to do a biopsy for guided sensitivity and culture, disc biopsy by IR Dr. Johann on 01/07/2024.  Results from the biopsy so far growing MRSA -   Per ID  Will continue vancomycin  as previously prescribed - EOT 9/29   He has follow-up visit with me scheduled for September 16.   [name]  01/11/2024, 1:15 PM     Syncope on 8/20 Probably related to soft blood pressure/orthostatic hypotension Telemetry monitoring unremarkable.  Chronic hypotension On midodrine  Narcotics for back pain very not helping. Minimize narcotics as much as possible-avoid further escalation in narcotic dosing.  ESRD on HD MWF Nephrology following.  Normocytic anemia Secondary to ESRD Hb stable Aranesp /iron  defer to nephrology service Follow  PAD-s/p bilateral BKA Normally uses prosthesis Due to severe pain-he has been unable to ambulate with a prosthesis.  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  Difficult disposition No family-uninsured-SNF not a option per SW-history of ESRD with bilateral BKA-currently unable to ambulate with his prostheses-hence unable to be discharged home safely.   Normally-prior to this hospitalization was able to ambulate with prosthesis and catch the bus/scat transport to go back and forth from dialysis center.   DM-2 (A1c 10.0 on 3/6) CBG stable SSI  Recent Labs    01/12/24 1154 01/12/24 1558 01/12/24 2159  GLUCAP 109* 123* 126*        Code status:   Code Status: Full Code   DVT Prophylaxis: heparin  injection 5,000 Units Start: 12/15/23 1400   Family Communication: None at bedside   Disposition Plan: Status is: Inpatient Remains inpatient appropriate because: Severe of illness   Planned Discharge Destination:Home health   Diet: Diet Order             Diet renal with fluid restriction Fluid restriction: 1200 mL Fluid; Room service appropriate? Yes; Fluid consistency: Thin  Diet effective now                   I have personally reviewed following labs and imaging studies  LABORATORY DATA:   Data Review:   Inpatient Medications  Scheduled Meds:  acetaminophen   1,000 mg Oral TID   artificial tears   Right Eye QID   bictegravir-emtricitabine -tenofovir  AF  1 tablet Oral Daily   Chlorhexidine  Gluconate Cloth  6 each Topical Q0600   darbepoetin (ARANESP ) injection - DIALYSIS  100 mcg Subcutaneous Q Fri-1800   fentaNYL   1 patch Transdermal Q72H   gabapentin   200 mg Oral TID   gatifloxacin   1 drop Right Eye BID   heparin   5,000 Units Subcutaneous Q8H   insulin  aspart  0-5 Units Subcutaneous QHS   insulin  aspart  0-6 Units Subcutaneous TID WC   lidocaine   2 patch Transdermal Q24H   methocarbamol   500 mg Oral TID   midodrine   10 mg Oral TID WC   midodrine   10 mg Oral Q T,Th,Sa-HD   pantoprazole   40 mg Oral  BID AC   polyethylene glycol  17 g Oral Daily   sevelamer  carbonate  800 mg Oral TID WC   Continuous Infusions:  vancomycin  Stopped (01/10/24 1719)   PRN Meds:.glucagon  (human recombinant), hydrALAZINE , ibuprofen , ipratropium-albuterol , metoprolol  tartrate, ondansetron  (ZOFRAN ) IV, oxyCODONE , sodium chloride  flush  DVT Prophylaxis  heparin  injection 5,000 Units Start: 12/15/23 1400     Recent Labs  Lab 01/08/24 1023  WBC 7.8  HGB 8.5*  HCT 26.7*  PLT 266  MCV 98.5  MCH 31.4  MCHC 31.8  RDW 16.5*    Recent Labs  Lab 01/07/24 1226 01/08/24 1037  NA  --  131*  K  --  4.8  CL  --  93*  CO2  --  21*  ANIONGAP  --  17*  GLUCOSE  --  104*  BUN  --  33*  CREATININE  --  5.53*  ALBUMIN   --  2.5*  INR 1.1  --   PHOS  --  4.0  CALCIUM   --  9.4      Recent Labs  Lab 01/07/24 1226 01/08/24 1037  INR 1.1  --   CALCIUM   --  9.4    --------------------------------------------------------------------------------------------------------------- Lab Results  Component Value Date   CHOL 166 04/16/2023   HDL 40 04/16/2023   LDLCALC  90 04/16/2023   TRIG 277 (H) 04/16/2023   CHOLHDL 4.2 04/16/2023    Lab Results  Component Value Date   HGBA1C 10.0 (A) 07/17/2023      Signature  -   Lavada Stank M.D on 01/13/2024 at 8:00 AM   -  To page go to [email or web address]

## 2024-01-13 NOTE — Progress Notes (Signed)
 OT Cancellation Note  Patient Details Name: Alvin Daniels MRN: [id number] DOB: 08-May-1971   Cancelled Treatment:    Reason Eval/Treat Not Completed: (P) Pain limiting ability to participate, Pt c/o 8/10 pain to LLE, refused EOB or bed level activities, states tomorrow he will try to ambulate with prosthetics. Will return tomorrow.  Elouise JONELLE Bott 01/13/2024, 3:21 PM

## 2024-01-13 NOTE — Progress Notes (Signed)
 Pharmacy Antibiotic Note  Alvin Daniels is a 53 y.o. male admitted on 12/14/2023 with osteomyelitis.  Pharmacy has been consulted for vancomycin  and cefepime  dosing. He is ESRD on MWF HD.  Pre Hd vanc level was 18 which is acceptable   Plan: Continue Vancomycin  500mg  IV qHD MWF Cefepime   2gm IV qHD MWF Monitor HD schedule/tolerance, clinical progress, vanc levels as indicated Abx end date: 9/29   Height: 4' 11 (149.9 cm) Weight: 40.3 kg (88 lb 13.5 oz) IBW/kg (Calculated) : 47.7  Temp (24hrs), Avg:98 F (36.7 C), Min:97.1 F (36.2 C), Max:98.8 F (37.1 C)  Recent Labs  Lab 01/08/24 1023 01/08/24 1037 01/13/24 0516 01/13/24 0850  WBC 7.8  --   --  8.3  CREATININE  --  5.53*  --   --   VANCORANDOM  --   --  18  --      Vanc 8/5 >> (9/29) Cefepime  8/5 >> (9/29) Cipro  8/14 >> 8/15 Biktarvy  (continued from PTA)      8/4 BCx - negative  Thank you. Alvin Daniels, PharmD

## 2024-01-14 ENCOUNTER — Ambulatory Visit: Payer: Self-pay | Admitting: Family Medicine

## 2024-01-14 LAB — GLUCOSE, CAPILLARY
Glucose-Capillary: 95 mg/dL (ref 70–99)
Glucose-Capillary: 123 mg/dL — ABNORMAL HIGH (ref 70–99)
Glucose-Capillary: 106 mg/dL — ABNORMAL HIGH (ref 70–99)
Glucose-Capillary: 144 mg/dL — ABNORMAL HIGH (ref 70–99)

## 2024-01-14 MED ORDER — DARBEPOETIN ALFA 150 MCG/0.3ML IJ SOSY
150.0000 ug | PREFILLED_SYRINGE | INTRAMUSCULAR | Status: DC
  Administered 2024-01-16 – 2024-02-27 (×7): 150 ug via SUBCUTANEOUS
  Filled 2024-01-14 (×7): qty 0.3

## 2024-01-14 NOTE — Progress Notes (Addendum)
 Physical Therapy Treatment Patient Details Name: Alvin Daniels MRN: [id number] DOB: June 22, 1970 Today's Date: 01/14/2024   History of Present Illness Patient is a 53 y/o male admitted 12/14/23 with hip pain MRI showing R psoas abscess and L4-L5 discitis and osteomyelitis. 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  PMH includes ESRD on HD MWF, hepC, MRSA, HIV, bilat BKA, DMII.    PT Comments  Pt once again limited by pain in LLE despite being premedicated. Had lengthy conversation with pt via in person interpreter with pt asking about prognosis, pain meds, and poor appetite. Pt appears to be very frustrated with current situation. Pt stated that he would like to speak with MD again using an in person interpreter instead of video interpreter. MD made aware. Per RN and MD, pt already had lengthy conversation with MD this morning however pt appears to have no recollection. Pt continues to move around the bed Mod I however declines any further mobility due to pain. No change in DC/DME recs at this time. PT will continue to follow.    If plan is discharge home, recommend the following: A little help with walking and/or transfers;Help with stairs or ramp for entrance;Assist for transportation;A little help with bathing/dressing/bathroom;Assistance with cooking/housework   Can travel by private vehicle        Equipment Recommendations  BSC/3in1    Recommendations for Other Services       Precautions / Restrictions Precautions Precautions: Fall Recall of Precautions/Restrictions: Impaired Restrictions Weight Bearing Restrictions Per Provider Order: No     Mobility  Bed Mobility Overal bed mobility: Modified Independent Bed Mobility: Supine to Sit     Supine to sit: Modified independent (Device/Increase time), Used rails, HOB elevated     General bed mobility comments: Pt utilized bed rail to pull up to sit  L EOB. Pt tends to lean laterally to his R hip to offload his painful L leg    Transfers                   General transfer comment: Pt repeatly declined due to pain.    Ambulation/Gait                   Stairs             Wheelchair Mobility     Tilt Bed    Modified Rankin (Stroke Patients Only)       Balance Overall balance assessment: Needs assistance Sitting-balance support: Feet unsupported, No upper extremity supported Sitting balance-Leahy Scale: Good                                      Communication Communication Communication: No apparent difficulties;Other (comment) Factors Affecting Communication: Non - English speaking, interpreter not available (Used in person interpreter, Raquel)  Cognition Arousal: Alert Behavior During Therapy: WFL for tasks assessed/performed   PT - Cognitive impairments: Safety/Judgement, Memory                       PT - Cognition Comments: Appears pt continues to ask questions about pain meds and prognosis however per RN pt had very long discussion with MD this morning that pt appears to have no recollection of. Following commands: Intact      Cueing Cueing Techniques: Verbal cues, Visual cues, Tactile cues  Exercises  General Comments General comments (skin integrity, edema, etc.): Had lengthy conversation with pt via in person interpreter with pt asking about prognosis, pain meds, and poor appetite. Pt appears to be very frustrated with current situation. Pt stated that he would like to speak with MD again using an in person interpreter instead of video interpreter. MD made aware.      Pertinent Vitals/Pain Pain Assessment Pain Assessment: 0-10 Pain Score: 8  Pain Location: L hip and leg Pain Descriptors / Indicators: Discomfort, Sore, Grimacing Pain Intervention(s): Monitored during session, Limited activity within patient's tolerance, Premedicated before session,  Repositioned    Home Living                          Prior Function            PT Goals (current goals can now be found in the care plan section) Progress towards PT goals: Progressing toward goals    Frequency    Min 2X/week      PT Plan      Co-evaluation              AM-PAC PT 6 Clicks Mobility   Outcome Measure  Help needed turning from your back to your side while in a flat bed without using bedrails?: None Help needed moving from lying on your back to sitting on the side of a flat bed without using bedrails?: None Help needed moving to and from a bed to a chair (including a wheelchair)?: A Little Help needed standing up from a chair using your arms (e.g., wheelchair or bedside chair)?: A Little Help needed to walk in hospital room?: Total Help needed climbing 3-5 steps with a railing? : Total 6 Click Score: 16    End of Session   Activity Tolerance: Patient limited by pain Patient left: in bed;with call bell/phone within reach;with bed alarm set;Other (comment) Nurse Communication: Mobility status PT Visit Diagnosis: Pain;Other abnormalities of gait and mobility (R26.89);Unsteadiness on feet (R26.81);Muscle weakness (generalized) (M62.81);Difficulty in walking, not elsewhere classified (R26.2) Pain - Right/Left: Left Pain - part of body: Hip;Leg     Time: 8482-8464 PT Time Calculation (min) (ACUTE ONLY): 18 min  Charges:    $Therapeutic Activity: 8-22 mins PT General Charges $$ ACUTE PT VISIT: 1 Visit                     Emmilee Reamer B, PT, DPT Acute Rehab Services [phone]    Manuella Blackson 01/14/2024, 3:54 PM

## 2024-01-14 NOTE — Progress Notes (Signed)
 OT Cancellation Note  Patient Details Name: Alvin Daniels MRN: [id number] DOB: [birth year]   Cancelled Treatment:    Reason Eval/Treat Not Completed: Patient declined, no reason specified (Patient declined OT stating too much leg pain, patient was premedicated.  OT to reattempt on later date.)  Maurya Nethery L Dusty 01/14/2024, 1:20 PM  Dick Dusty, OTA Acute Rehabilitation Services  Office [phone]

## 2024-01-14 NOTE — Plan of Care (Signed)

## 2024-01-14 NOTE — Progress Notes (Signed)
 PROGRESS NOTE        PATIENT DETAILS Name: Alvin Daniels Age: 53 y.o. Sex: male Date of Birth: 10/11/1970 Admit Date: 12/14/2023 Admitting Physician Marsha Ada, MD PCP:Pcp, No  Brief Summary: Patient is a 53 y.o.  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  Significant events: 8/4>> admit to [facility]  Significant studies: 6/4>> TEE: EF 60-65%, 1.8 x 1.2 mass right atrial wall-?vegetation from old HD catheter impinged on RA wall-versus mass/tumor. 8/4>> MRI pelvis: Discitis/osteomyelitis L4-5-right psoas abscess. 8/5>> CT abdomen/pelvis: Discitis/osteomyelitis L4-5 with paraspinal inflammatory changes/right psoas abscess.  8/8>> MRI LS spine: L4-5 discitis/osteomyelitis with associated paraspinal inflammatory changes, right psoas abscess. 8/20>> MRI left hip: Discitis of the lower lumbar spine with extension into the psoas muscle. 8/20>> BKA-no significant abnormality.  Significant microbiology data: 5/28>> blood culture: MRSA 5/29>> left finger abscess: E. coli/Streptococcus anginosus, Bacteroides fragilis. 8/4>> blood culture: No growth  Procedures: None.  Consults: ID Nephrology Ophthalmology  Subjective: No significant events overnight, he does complaint of chronic left stump pain    Objective: Vitals: Blood pressure 118/70, pulse 92, temperature 98.2 F (36.8 C), temperature source Oral, resp. rate 20, height 4' 11 (1.499 m), weight 41.2 kg, SpO2 96%.   Exam:  Awake, alert, frail, chronically ill-appearing  Good air entry bilaterally  Regular rate and rhythm  Abdomen soft  Bilateral BKA, right IJ HD catheter     Assessment/Plan:  L4-L5 discitis with surrounding  paraspinal inflammatory change and right psoas abscess Recent history of MRSA bacteremia/left fourth finger osteomyelitis May 2025 (strep/Bacteroides/E. coli on bone culture) Likely MRSA-although cultures this admission negative.   Evaluated by ID-recommendations are for Vancomycin  with HD x 8 weeks - EOT 9/29 Reviewed prior notes-not a surgical candidate-IR unable to drain due to lack of safe window. Repeat MRI on 01/05/2024 noted with progression of disease, ID following they have recommended IR to do a biopsy for guided sensitivity and culture, disc biopsy by IR Dr. Johann on 01/07/2024.  Results from the biopsy so far growing MRSA -   Per ID  Will continue vancomycin  as previously prescribed - EOT 9/29   He has follow-up visit with me scheduled for September 16.   [name]  01/11/2024, 1:15 PM     Syncope on 8/20 Probably related to soft blood pressure/orthostatic hypotension Telemetry monitoring unremarkable.  Chronic hypotension On midodrine  Narcotics for back pain very not helping. Minimize narcotics as much as possible-avoid further escalation in narcotic dosing.  ESRD on HD MWF Nephrology following.  Normocytic anemia Secondary to ESRD Hb stable Aranesp /iron  defer to nephrology service Follow  PAD-s/p bilateral BKA Normally uses prosthesis Due to severe pain-he has been unable to ambulate with a prosthesis.  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  Difficult disposition No family-uninsured-SNF not a option per SW-history of ESRD with bilateral BKA-currently unable to ambulate with his prostheses-hence unable to be discharged  home  safely.   Normally-prior to this hospitalization was able to ambulate with prosthesis and catch the bus/scat transport to go back and forth from dialysis center.   DM-2 (A1c 10.0 on 3/6) CBG stable SSI  Recent Labs    01/13/24 2006 01/14/24 0805 01/14/24 1231  GLUCAP 121* 95 123*        Code status:   Code Status: Full Code   DVT Prophylaxis: heparin  injection 5,000 Units Start: 12/15/23 1400   Family Communication: None at bedside   Disposition Plan: Status is: Inpatient Remains inpatient appropriate because: Severe of illness   Planned Discharge Destination:Home health   Diet: Diet Order             Diet renal with fluid restriction Fluid restriction: 1200 mL Fluid; Room service appropriate? Yes; Fluid consistency: Thin  Diet effective now                   I have personally reviewed following labs and imaging studies  LABORATORY DATA:   Data Review:   Inpatient Medications  Scheduled Meds:  (feeding supplement) PROSource Plus  30 mL Oral BID BM   acetaminophen   1,000 mg Oral TID   artificial tears   Right Eye QID   bictegravir-emtricitabine -tenofovir  AF  1 tablet Oral Daily   Chlorhexidine  Gluconate Cloth  6 each Topical Q0600   [START ON 01/16/2024] darbepoetin (ARANESP ) injection - DIALYSIS  150 mcg Subcutaneous Q Fri-1800   fentaNYL   1 patch Transdermal Q72H   gabapentin   200 mg Oral TID   gatifloxacin   1 drop Right Eye BID   heparin   5,000 Units Subcutaneous Q8H   insulin  aspart  0-5 Units Subcutaneous QHS   insulin  aspart  0-6 Units Subcutaneous TID WC   lidocaine   2 patch Transdermal Q24H   methocarbamol   500 mg Oral TID   midodrine   10 mg Oral TID WC   midodrine   10 mg Oral Q T,Th,Sa-HD   pantoprazole   40 mg Oral BID AC   polyethylene glycol  17 g Oral Daily   sevelamer  carbonate  800 mg Oral TID WC   Continuous Infusions:  vancomycin  Stopped (01/14/24 0222)   PRN Meds:.glucagon  (human recombinant), hydrALAZINE , ibuprofen ,  ipratropium-albuterol , metoprolol  tartrate, ondansetron  (ZOFRAN ) IV, oxyCODONE , sodium chloride  flush  DVT Prophylaxis  heparin  injection 5,000 Units Start: 12/15/23 1400     Recent Labs  Lab 01/08/24 1023 01/13/24 0850  WBC 7.8 8.3  HGB 8.5* 9.0*  HCT 26.7* 28.6*  PLT 266 282  MCV 98.5 100.4*  MCH 31.4 31.6  MCHC 31.8 31.5  RDW 16.5* 16.7*    Recent Labs  Lab 01/08/24 1037 01/13/24 0906  NA 131* 132*  K 4.8 4.3  CL 93* 92*  CO2 21* 21*  ANIONGAP 17* 19*  GLUCOSE 104* 121*  BUN 33* 38*  CREATININE 5.53* 5.81*  ALBUMIN  2.5* 2.4*  PHOS 4.0 3.8  CALCIUM  9.4 9.3      Recent Labs  Lab 01/08/24 1037 01/13/24 0906  CALCIUM  9.4 9.3    --------------------------------------------------------------------------------------------------------------- Lab Results  Component Value Date   CHOL 166 04/16/2023   HDL 40 04/16/2023   LDLCALC 90 04/16/2023   TRIG 277 (H) 04/16/2023   CHOLHDL 4.2 04/16/2023    Lab Results  Component Value Date   HGBA1C 10.0 (A) 07/17/2023      Signature  -   Janise Gora M.D on 01/14/2024 at 1:56 PM   -  To page go to [email or web address]

## 2024-01-14 NOTE — Progress Notes (Signed)
 Nutrition Education Note  RD consulted for Renal Education. Reviewed food groups and provided written recommended serving sizes specifically determined for patient's current nutritional status via phone interpreter.   Explained why diet restrictions are needed and provided lists of foods to limit/avoid that are high potassium, sodium, and phosphorus. Provided specific recommendations on safer alternatives of these foods. Strongly encouraged compliance of this diet.   Discussed importance of protein intake at each meal and snack. Provided examples of how to maximize protein intake throughout the day. Discussed need for fluid restriction with dialysis, importance of minimizing weight gain between HD treatments, and renal-friendly beverage options.  Encouraged pt to discuss specific diet questions/concerns with RD at HD outpatient facility. Teach back method used.  Expect questionable compliance and comprehension. Will add education to AVS for when he is ready for discharge, which may be a while.   Body mass index is 18.35 kg/m. Pt meets criteria for underweight for age based on current BMI.  Current diet order is renal diet, patient is consuming approximately 75% of meals at this time, however need updated documentation. Labs and medications reviewed.   He is frail in appearance and endorses poor appetite. Will consult for nutrition assessment. Full assessment to follow.   Blair Deaner MS, RD, LDN Registered Dietitian Clinical Nutrition RD Inpatient Contact Info in Amion

## 2024-01-14 NOTE — Progress Notes (Signed)
 [facility] Progress Note   Subjective:   Seen in room. HD ended early d/t L thigh pain - feels better today. No dyspnea.  Objective Vitals:   01/13/24 2003 01/14/24 0000 01/14/24 0400 01/14/24 0806  BP: 114/68 106/70 118/70   Pulse: 88 86 92   Resp: 20 20 20    Temp: 98.1 F (36.7 C) 98.4 F (36.9 C) 98.6 F (37 C) 97.8 F (36.6 C)  TempSrc: Oral Oral Oral Oral  SpO2: 96% 96% 96%   Weight:      Height:       Physical Exam General: Frail man, pleasant, NAD. Room air Heart: RRR Lungs: CTAB Abdomen: soft Extremities: B BKA, no stump erythema Dialysis Access: TDC in R chest  Additional Objective Labs: Basic Metabolic Panel: Recent Labs  Lab 01/08/24 1037 01/13/24 0906  NA 131* 132*  K 4.8 4.3  CL 93* 92*  CO2 21* 21*  GLUCOSE 104* 121*  BUN 33* 38*  CREATININE 5.53* 5.81*  CALCIUM  9.4 9.3  PHOS 4.0 3.8   Liver Function Tests: Recent Labs  Lab 01/08/24 1037 01/13/24 0906  ALBUMIN  2.5* 2.4*   CBC: Recent Labs  Lab 01/08/24 1023 01/13/24 0850  WBC 7.8 8.3  HGB 8.5* 9.0*  HCT 26.7* 28.6*  MCV 98.5 100.4*  PLT 266 282   Blood Culture    Component Value Date/Time   SDES NEEDLE ASPIRATE 01/07/2024 1612   SPECREQUEST DISC 01/07/2024 1612   CULT  01/07/2024 1612    FEW METHICILLIN RESISTANT STAPHYLOCOCCUS AUREUS NO ANAEROBES ISOLATED Performed at [facility], [address] [place], [place] [address]    REPTSTATUS 01/12/2024 FINAL 01/07/2024 1612   Medications:  vancomycin  Stopped (01/14/24 0222)    (feeding supplement) PROSource Plus  30 mL Oral BID BM   acetaminophen   1,000 mg Oral TID   artificial tears   Right Eye QID   bictegravir-emtricitabine -tenofovir  AF  1 tablet Oral Daily   Chlorhexidine  Gluconate Cloth  6 each Topical Q0600   darbepoetin (ARANESP ) injection - DIALYSIS  100 mcg Subcutaneous Q Fri-1800   fentaNYL   1 patch Transdermal Q72H   gabapentin   200 mg Oral TID   gatifloxacin   1 drop Right Eye BID    heparin   5,000 Units Subcutaneous Q8H   insulin  aspart  0-5 Units Subcutaneous QHS   insulin  aspart  0-6 Units Subcutaneous TID WC   lidocaine   2 patch Transdermal Q24H   methocarbamol   500 mg Oral TID   midodrine   10 mg Oral TID WC   midodrine   10 mg Oral Q T,Th,Sa-HD   pantoprazole   40 mg Oral BID AC   polyethylene glycol  17 g Oral Daily   sevelamer  carbonate  800 mg Oral TID WC    Dialysis Orders [facility] - MWF 3.5h  B400  45.5kg  TDC  Heparin  2000 Mircera 100 mcg - last dose 12/01/23 Calcitriol  1.0 mcg (on hold due to high Ca) Uses midodrine  10mg  TID + extra pre-HD    Assessment/Plan: 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 S/p IR aspiration 8/27 - growing MRSA. Not a surgical candidate. Pain management per primary.   ESRD: Historically MWF schedule, now TTS here and wishes to stay on this schedule for now. Next HD tomorrow. BP/volume: Chronic hypotension on mido 10mg  TID and extra pre-HD. +eyelid edema, but below prior EDW. Anemia of ESRD: Hgb 9 - getting Aranesp  100 q week, will ^  next dose. No IV iron  d/t infection. HIV: controlled on Biktarvy  BMD: CorrCa high, VDRA on hold. Phos stable, on sevelamer  as binder.  Nutrition: Alb low, adding supps and continue renal diet w/fluid restrictions. Debility: PT has requested that we schedule him for 2nd shift whenever we can.  Dispo: Very difficult placement.   Izetta Boehringer, PA-C 01/14/2024, 11:50 AM  [employer]

## 2024-01-15 LAB — RENAL FUNCTION PANEL
Albumin: 2.3 g/dL — ABNORMAL LOW (ref 3.5–5.0)
Anion gap: 18 — ABNORMAL HIGH (ref 5–15)
BUN: 49 mg/dL — ABNORMAL HIGH (ref 6–20)
CO2: 21 mmol/L — ABNORMAL LOW (ref 22–32)
Calcium: 9.3 mg/dL (ref 8.9–10.3)
Chloride: 91 mmol/L — ABNORMAL LOW (ref 98–111)
Creatinine, Ser: 6.42 mg/dL — ABNORMAL HIGH (ref 0.61–1.24)
GFR, Estimated: 10 mL/min — ABNORMAL LOW (ref >60)
Glucose, Bld: 102 mg/dL — ABNORMAL HIGH (ref 70–99)
Phosphorus: 4.9 mg/dL — ABNORMAL HIGH (ref 2.5–4.6)
Potassium: 5 mmol/L (ref 3.5–5.1)
Sodium: 130 mmol/L — ABNORMAL LOW (ref 135–145)

## 2024-01-15 LAB — GLUCOSE, CAPILLARY
Glucose-Capillary: 107 mg/dL — ABNORMAL HIGH (ref 70–99)
Glucose-Capillary: 107 mg/dL — ABNORMAL HIGH (ref 70–99)
Glucose-Capillary: 71 mg/dL (ref 70–99)

## 2024-01-15 LAB — CBC
HCT: 29.3 % — ABNORMAL LOW (ref 39.0–52.0)
Hemoglobin: 9.4 g/dL — ABNORMAL LOW (ref 13.0–17.0)
MCH: 31.6 pg (ref 26.0–34.0)
MCHC: 32.1 g/dL (ref 30.0–36.0)
MCV: 98.7 fL (ref 80.0–100.0)
Platelets: 290 K/uL (ref 150–400)
RBC: 2.97 MIL/uL — ABNORMAL LOW (ref 4.22–5.81)
RDW: 17.1 % — ABNORMAL HIGH (ref 11.5–15.5)
WBC: 10.4 K/uL (ref 4.0–10.5)
nRBC: 0 % (ref 0.0–0.2)

## 2024-01-15 LAB — HEPATITIS B SURFACE ANTIGEN: Hepatitis B Surface Ag: NONREACTIVE

## 2024-01-15 MED ORDER — MIDODRINE HCL 5 MG PO TABS
ORAL_TABLET | ORAL | Status: AC
  Filled 2024-01-15: qty 2

## 2024-01-15 MED ORDER — ALBUMIN HUMAN 25 % IV SOLN
INTRAVENOUS | Status: AC
  Filled 2024-01-15: qty 100

## 2024-01-15 MED ORDER — ALBUMIN HUMAN 25 % IV SOLN
12.5000 g | Freq: Once | INTRAVENOUS | Status: AC
  Administered 2024-01-15: 12.5 g via INTRAVENOUS

## 2024-01-15 MED ORDER — VANCOMYCIN HCL 500 MG/100ML IV SOLN
INTRAVENOUS | Status: AC
  Filled 2024-01-15: qty 100

## 2024-01-15 NOTE — Progress Notes (Signed)
 PROGRESS NOTE        PATIENT DETAILS Name: Alvin Daniels Age: 53 y.o. Sex: male Date of Birth: January 01, 1971 Admit Date: 12/14/2023 Admitting Physician Marsha Ada, MD PCP:Pcp, No  Brief Summary:  Patient is a 53 y.o.  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  Significant events: 8/4>> admit to [facility]  Significant studies: 6/4>> TEE: EF 60-65%, 1.8 x 1.2 mass right atrial wall-?vegetation from old HD catheter impinged on RA wall-versus mass/tumor. 8/4>> MRI pelvis: Discitis/osteomyelitis L4-5-right psoas abscess. 8/5>> CT abdomen/pelvis: Discitis/osteomyelitis L4-5 with paraspinal inflammatory changes/right psoas abscess.  8/8>> MRI LS spine: L4-5 discitis/osteomyelitis with associated paraspinal inflammatory changes, right psoas abscess. 8/20>> MRI left hip: Discitis of the lower lumbar spine with extension into the psoas muscle. 8/20>> BKA-no significant abnormality.  Significant microbiology data: 5/28>> blood culture: MRSA 5/29>> left finger abscess: E. coli/Streptococcus anginosus, Bacteroides fragilis. 8/4>> blood culture: No growth  Procedures: None.  Consults: ID Nephrology Ophthalmology  Subjective:  No significant events overnight as discussed with staff, patient still complains of his left hip pain and left stump pain   Objective: Vitals: Blood pressure 129/89, pulse 75, temperature 98.1 F (36.7 C), resp. rate 10, height 4' 11 (1.499 m), weight 40.6 kg, SpO2 100%.   Exam:  Awake, alert, frail, chronic ill-appearing Good air entry,  regular rate and rhythm Abdomen soft Bilateral BKA, right IJ HD catheter     Assessment/Plan:  L4-L5 discitis with  surrounding paraspinal inflammatory change and right psoas abscess Recent history of MRSA bacteremia/left fourth finger osteomyelitis May 2025 (strep/Bacteroides/E. coli on bone culture) Likely MRSA-although cultures this admission negative.   Evaluated by ID-recommendations are for Vancomycin  with HD x 8 weeks - EOT 9/29 Reviewed prior notes-not a surgical candidate-IR unable to drain due to lack of safe window. Repeat MRI on 01/05/2024 noted with progression of disease, ID following they have recommended IR to do a biopsy for guided sensitivity and culture, disc biopsy by IR Dr. Johann on 01/07/2024.  Results from the biopsy so far growing MRSA -   Per ID  Will continue vancomycin  as previously prescribed - EOT 9/29   He has follow-up visit with me scheduled for September 16.   Alvin Daniels  01/11/2024, 1:15 PM   Syncope on 8/20 Probably related to soft blood pressure/orthostatic hypotension Telemetry monitoring unremarkable.  Chronic hypotension On midodrine  Narcotics for back pain very not helping. Minimize narcotics as much as possible-avoid further escalation in narcotic dosing.  ESRD on HD MWF Nephrology following.  Normocytic anemia Secondary to ESRD Hb stable Aranesp /iron  defer to nephrology service Follow  PAD-s/p bilateral BKA Normally uses prosthesis Due to severe pain-he has been unable to ambulate with a prosthesis.  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  Difficult disposition No family-uninsured-SNF not a option per SW-history of ESRD with bilateral BKA-currently unable to ambulate with his prostheses-hence unable to be  discharged home  safely.   Normally-prior to this hospitalization was able to ambulate with prosthesis and catch the bus/scat transport to go back and forth from dialysis center.   DM-2 (A1c 10.0 on 3/6) CBG stable SSI  Recent Labs    01/14/24 2108 01/15/24 0803 01/15/24 1246  GLUCAP 144* 107* 107*        Code status:   Code Status: Full Code   DVT Prophylaxis: heparin  injection 5,000 Units Start: 12/15/23 1400   Family Communication: None at bedside  Have discussed at length with the patient, with Spanish-speaking nurse, I have explained for him in details his medical issues, I have answered his question about pain control, I have discussed with him that his blood pressure is limiting factor for his pain regimen, but he has been getting 2 tablets of oxycodone  on average per day where he can get up to 4 tablets a day, so I have asked him to request it more frequently, and if needed we can increase the frequency at a lower dose where he can get it in a timely manner.   Disposition Plan: Status is: Inpatient Remains inpatient appropriate because: Severe of illness   Planned Discharge Destination:Home health   Diet: Diet Order             Diet renal with fluid restriction Fluid restriction: 1200 mL Fluid; Room service appropriate? Yes; Fluid consistency: Thin  Diet effective now                   I have personally reviewed following labs and imaging studies  LABORATORY DATA:   Data Review:   Inpatient Medications  Scheduled Meds:  (feeding supplement) PROSource Plus  30 mL Oral BID BM   acetaminophen   1,000 mg Oral TID   artificial tears   Right Eye QID   bictegravir-emtricitabine -tenofovir  AF  1 tablet Oral Daily   Chlorhexidine  Gluconate Cloth  6 each Topical Q0600   [START ON 01/16/2024] darbepoetin (ARANESP ) injection - DIALYSIS  150 mcg Subcutaneous Q Fri-1800   fentaNYL   1 patch Transdermal Q72H   gabapentin   200 mg Oral TID   gatifloxacin   1 drop Right  Eye BID   heparin   5,000 Units Subcutaneous Q8H   insulin  aspart  0-5 Units Subcutaneous QHS   insulin  aspart  0-6 Units Subcutaneous TID WC   lidocaine   2 patch Transdermal Q24H   methocarbamol   500 mg Oral TID   midodrine   10 mg Oral TID WC   midodrine   10 mg Oral Q T,Th,Sa-HD   pantoprazole   40 mg Oral BID AC   polyethylene glycol  17 g Oral Daily   sevelamer  carbonate  800 mg Oral TID WC   Continuous Infusions:  vancomycin  Stopped (01/14/24 0222)   PRN Meds:.glucagon  (human recombinant), hydrALAZINE , ibuprofen , ipratropium-albuterol , metoprolol  tartrate, ondansetron  (ZOFRAN ) IV, oxyCODONE , sodium chloride  flush  DVT Prophylaxis  heparin  injection 5,000 Units Start: 12/15/23 1400     Recent Labs  Lab 01/13/24 0850  WBC 8.3  HGB 9.0*  HCT 28.6*  PLT 282  MCV 100.4*  MCH 31.6  MCHC 31.5  RDW 16.7*    Recent Labs  Lab 01/13/24 0906  NA 132*  K 4.3  CL 92*  CO2 21*  ANIONGAP 19*  GLUCOSE 121*  BUN 38*  CREATININE 5.81*  ALBUMIN  2.4*  PHOS 3.8  CALCIUM  9.3      Recent Labs  Lab 01/13/24 0906  CALCIUM  9.3    --------------------------------------------------------------------------------------------------------------- Lab Results  Component Value Date  CHOL 166 04/16/2023   HDL 40 04/16/2023   LDLCALC 90 04/16/2023   TRIG 277 (H) 04/16/2023   CHOLHDL 4.2 04/16/2023    Lab Results  Component Value Date   HGBA1C 10.0 (A) 07/17/2023      Signature  -   Raveen Wieseler M.D on 01/15/2024 at 3:10 PM   -  To page go to [email or web address]

## 2024-01-15 NOTE — Plan of Care (Signed)

## 2024-01-15 NOTE — Progress Notes (Signed)
 [facility] Progress Note   Subjective:   Seen in room. Ongoing L thigh pain. Otherwise feels ok. For HD today.  Objective Vitals:   01/14/24 2107 01/15/24 0000 01/15/24 0400 01/15/24 0801  BP: (!) 104/59 116/66 112/64 102/75  Pulse: 85 86 84   Resp: 19 20 18    Temp: 98.2 F (36.8 C) 98.4 F (36.9 C) 98.6 F (37 C) 98.2 F (36.8 C)  TempSrc: Oral Oral Oral Oral  SpO2: 96% 95% 95%   Weight:      Height:       Physical Exam General: Frail man, NAD. Room air. Eyelids slightly less edematous. Heart: RRR Lungs: CTAB, no rales Abdomen: soft Extremities: B BKA, no stump erythema Dialysis Access: TDC in R chest  Additional Objective Labs: Basic Metabolic Panel: Recent Labs  Lab 01/13/24 0906  NA 132*  K 4.3  CL 92*  CO2 21*  GLUCOSE 121*  BUN 38*  CREATININE 5.81*  CALCIUM  9.3  PHOS 3.8   Liver Function Tests: Recent Labs  Lab 01/13/24 0906  ALBUMIN  2.4*   CBC: Recent Labs  Lab 01/13/24 0850  WBC 8.3  HGB 9.0*  HCT 28.6*  MCV 100.4*  PLT 282   Blood Culture    Component Value Date/Time   SDES NEEDLE ASPIRATE 01/07/2024 1612   SPECREQUEST DISC 01/07/2024 1612   CULT  01/07/2024 1612    FEW METHICILLIN RESISTANT STAPHYLOCOCCUS AUREUS NO ANAEROBES ISOLATED Performed at [facility], [address] [place], [place] [address]    REPTSTATUS 01/12/2024 FINAL 01/07/2024 1612   Medications:  vancomycin  Stopped (01/14/24 0222)    (feeding supplement) PROSource Plus  30 mL Oral BID BM   acetaminophen   1,000 mg Oral TID   artificial tears   Right Eye QID   bictegravir-emtricitabine -tenofovir  AF  1 tablet Oral Daily   Chlorhexidine  Gluconate Cloth  6 each Topical Q0600   [START ON 01/16/2024] darbepoetin (ARANESP ) injection - DIALYSIS  150 mcg Subcutaneous Q Fri-1800   fentaNYL   1 patch Transdermal Q72H   gabapentin   200 mg Oral TID   gatifloxacin   1 drop Right Eye BID   heparin   5,000 Units Subcutaneous Q8H   insulin  aspart  0-5  Units Subcutaneous QHS   insulin  aspart  0-6 Units Subcutaneous TID WC   lidocaine   2 patch Transdermal Q24H   methocarbamol   500 mg Oral TID   midodrine   10 mg Oral TID WC   midodrine   10 mg Oral Q T,Th,Sa-HD   pantoprazole   40 mg Oral BID AC   polyethylene glycol  17 g Oral Daily   sevelamer  carbonate  800 mg Oral TID WC    Dialysis Orders [facility] - MWF 3.5h  B400  45.5kg  TDC  Heparin  2000 Mircera 100 mcg - last dose 12/01/23 Calcitriol  1.0 mcg (on hold due to high Ca) Uses midodrine  10mg  TID + extra pre-HD    Assessment/Plan: 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 S/p IR aspiration 8/27 - growing MRSA. Not a surgical candidate. Pain management per primary.   ESRD: Historically MWF schedule, wants to run TTS here - for HD today. BP/volume: Chronic hypotension on mido 10mg  TID and extra pre-HD. Below prior EDW. Anemia of ESRD: Hgb 9 - getting Aranesp  100 q week, will ^ next dose. No IV iron  d/t infection. HIV: controlled on Biktarvy  BMD: CorrCa high, VDRA on hold. Phos stable, on sevelamer  as binder.  Nutrition: Alb low, adding supps and continue renal diet w/fluid restrictions. Debility: PT has requested that we schedule him for 2nd shift whenever we can.  Dispo: Very difficult placement.   Izetta Boehringer, PA-C 01/15/2024, 12:06 PM  [facility]

## 2024-01-15 NOTE — Progress Notes (Signed)
   01/15/24 1805  Vitals  Temp (!) 97.4 F (36.3 C)  Pulse Rate 87  Resp 11  BP 98/62  SpO2 100 %  Weight 41 kg  Type of Weight Post-Dialysis  Oxygen Therapy  Patient Activity (if Appropriate) In bed  Pulse Oximetry Type Continuous  Oximetry Probe Site Changed No  Post Treatment  Dialyzer Clearance Lightly streaked  Hemodialysis Intake (mL) 800 mL  Liters Processed 78  Fluid Removed (mL) 0 mL  Tolerated HD Treatment Yes   Received patient in bed to unit.  Alert and oriented.  Informed consent signed and in chart.   TX duration:3.25hrs  Patient tolerated well.  Transported back to the room  Alert, without acute distress.  Hand-off given to patient's nurse.   Access used: [facility] Access issues: none  Total UF removed: +830mL Medication(s) given: midodrine , albumin , vanc    [facility]

## 2024-01-16 LAB — GLUCOSE, CAPILLARY
Glucose-Capillary: 59 mg/dL — ABNORMAL LOW (ref 70–99)
Glucose-Capillary: 103 mg/dL — ABNORMAL HIGH (ref 70–99)
Glucose-Capillary: 60 mg/dL — ABNORMAL LOW (ref 70–99)
Glucose-Capillary: 110 mg/dL — ABNORMAL HIGH (ref 70–99)
Glucose-Capillary: 119 mg/dL — ABNORMAL HIGH (ref 70–99)
Glucose-Capillary: 130 mg/dL — ABNORMAL HIGH (ref 70–99)

## 2024-01-16 MED ORDER — NEPRO/CARBSTEADY PO LIQD
237.0000 mL | Freq: Two times a day (BID) | ORAL | Status: DC
  Administered 2024-01-16 – 2024-02-18 (×29): 237 mL via ORAL

## 2024-01-16 MED ORDER — RENA-VITE PO TABS
1.0000 | ORAL_TABLET | Freq: Every day | ORAL | Status: DC
  Administered 2024-01-16 – 2024-02-21 (×37): 1 via ORAL
  Filled 2024-01-16 (×38): qty 1

## 2024-01-16 MED ORDER — CHLORHEXIDINE GLUCONATE CLOTH 2 % EX PADS
6.0000 | MEDICATED_PAD | Freq: Every day | CUTANEOUS | Status: DC
  Administered 2024-01-17 – 2024-01-18 (×2): 6 via TOPICAL

## 2024-01-16 NOTE — Plan of Care (Signed)

## 2024-01-16 NOTE — Progress Notes (Signed)
 [facility] Progress Note   Subjective:    Seen and examined patient at bedside. Denies any acute complaints. Tolerated yesterday's HD with minimal UF ( ). Midodrine  and IV Albumin  were given. On IV ABXs. Next HD 9/6.  Objective Vitals:   01/15/24 2215 01/16/24 0056 01/16/24 0755 01/16/24 1137  BP: 122/83 (!) 93/57 98/69 113/74  Pulse:      Resp: 17 16    Temp: 97.9 F (36.6 C) 97.8 F (36.6 C) 97.7 F (36.5 C) 98 F (36.7 C)  TempSrc: Oral Oral Oral Oral  SpO2: 98% 99%    Weight:      Height:       Physical Exam General: Frail man, NAD. Room air. Eyelids slightly less edematous. Heart: RRR Lungs: CTAB, no rales Abdomen: soft Extremities: B BKA, no stump erythema Dialysis Access: TDC in R chest  Filed Weights   01/13/24 1017 01/15/24 1422 01/15/24 1805  Weight: 41.2 kg 40.6 kg 41 kg    Intake/Output Summary (Last 24 hours) at 01/16/2024 1423 Last data filed at 01/16/2024 0000 Gross per 24 hour  Intake 840 ml  Output 0 ml  Net 840 ml    Additional Objective Labs: Basic Metabolic Panel: Recent Labs  Lab 01/13/24 0906 01/15/24 1503  NA 132* 130*  K 4.3 5.0  CL 92* 91*  CO2 21* 21*  GLUCOSE 121* 102*  BUN 38* 49*  CREATININE 5.81* 6.42*  CALCIUM  9.3 9.3  PHOS 3.8 4.9*   Liver Function Tests: Recent Labs  Lab 01/13/24 0906 01/15/24 1503  ALBUMIN  2.4* 2.3*   No results for input(s): LIPASE, AMYLASE in the last 168 hours. CBC: Recent Labs  Lab 01/13/24 0850 01/15/24 1504  WBC 8.3 10.4  HGB 9.0* 9.4*  HCT 28.6* 29.3*  MCV 100.4* 98.7  PLT 282 290   Blood Culture    Component Value Date/Time   SDES NEEDLE ASPIRATE 01/07/2024 1612   SPECREQUEST DISC 01/07/2024 1612   CULT  01/07/2024 1612    FEW METHICILLIN RESISTANT STAPHYLOCOCCUS AUREUS NO ANAEROBES ISOLATED Performed at [facility], [address] [place], [place] [address]    REPTSTATUS 01/12/2024 FINAL 01/07/2024 1612    Cardiac Enzymes: No results  for input(s): CKTOTAL, CKMB, CKMBINDEX, TROPONINI in the last 168 hours. CBG: Recent Labs  Lab 01/15/24 2218 01/16/24 0759 01/16/24 0837 01/16/24 0923 01/16/24 1140  GLUCAP 71 59* 60* 103* 110*   Iron  Studies: No results for input(s): IRON , TIBC, TRANSFERRIN, FERRITIN in the last 72 hours. Lab Results  Component Value Date   INR 1.1 01/07/2024   INR 1.1 12/15/2023   INR 1.2 10/08/2023   Studies/Results: No results found.  Medications:  vancomycin  500 mg (01/15/24 1626)    (feeding supplement) PROSource Plus  30 mL Oral BID BM   acetaminophen   1,000 mg Oral TID   artificial tears   Right Eye QID   bictegravir-emtricitabine -tenofovir  AF  1 tablet Oral Daily   Chlorhexidine  Gluconate Cloth  6 each Topical Q0600   darbepoetin (ARANESP ) injection - DIALYSIS  150 mcg Subcutaneous Q Fri-1800   fentaNYL   1 patch Transdermal Q72H   gabapentin   200 mg Oral TID   gatifloxacin   1 drop Right Eye BID   heparin   5,000 Units Subcutaneous Q8H   insulin  aspart  0-5 Units Subcutaneous QHS   insulin  aspart  0-6 Units Subcutaneous TID WC   lidocaine   2 patch Transdermal Q24H   methocarbamol   500 mg Oral TID   midodrine   10 mg Oral TID WC   midodrine   10 mg Oral Q T,Th,Sa-HD   pantoprazole   40 mg Oral BID AC   polyethylene glycol  17 g Oral Daily   sevelamer  carbonate  800 mg Oral TID WC    Dialysis Orders: [facility] - MWF 3.5h  B400  45.5kg  TDC  Heparin  2000 Mircera 100 mcg - last dose 12/01/23 Calcitriol  1.0 mcg (on hold due to high Ca) Uses midodrine  10mg  TID + extra pre-HD   Assessment/Plan: 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 S/p IR aspiration 8/27 - growing MRSA. Not a surgical candidate. Pain management per primary.   ESRD: Historically MWF schedule. On TTS schedule this week. Next HD 9/6. Can go back to MWF next week. BP/volume: Chronic hypotension on mido 10mg  TID and extra pre-HD.  Below prior EDW. Anemia of ESRD: Hgb 9 - getting Aranesp  100 q week, will ^ next dose. No IV iron  d/t infection. HIV: controlled on Biktarvy  BMD: CorrCa high, VDRA on hold. Phos stable, on sevelamer  as binder.  Nutrition: Alb low, adding supps and continue renal diet w/fluid restrictions. Debility: PT has requested that we schedule him for 2nd shift whenever we can.  Dispo: Very difficult placement. Noted patient is uninsured thus SNF is not an option.  Charmaine Piety, NP [facility] 01/16/2024,2:23 PM  LOS: 32 days

## 2024-01-16 NOTE — Progress Notes (Signed)
 PROGRESS NOTE        PATIENT DETAILS Name: Alvin Daniels Age: 53 y.o. Sex: male Date of Birth: 28-Oct-1970 Admit Date: 12/14/2023 Admitting Physician Marsha Ada, MD PCP:Pcp, No  Brief Summary:  Patient is a 53 y.o.  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  Significant events: 8/4>> admit to [facility]  Significant studies: 6/4>> TEE: EF 60-65%, 1.8 x 1.2 mass right atrial wall-?vegetation from old HD catheter impinged on RA wall-versus mass/tumor. 8/4>> MRI pelvis: Discitis/osteomyelitis L4-5-right psoas abscess. 8/5>> CT abdomen/pelvis: Discitis/osteomyelitis L4-5 with paraspinal inflammatory changes/right psoas abscess.  8/8>> MRI LS spine: L4-5 discitis/osteomyelitis with associated paraspinal inflammatory changes, right psoas abscess. 8/20>> MRI left hip: Discitis of the lower lumbar spine with extension into the psoas muscle. 8/20>> BKA-no significant abnormality.  Significant microbiology data: 5/28>> blood culture: MRSA 5/29>> left finger abscess: E. coli/Streptococcus anginosus, Bacteroides fragilis. 8/4>> blood culture: No growth  Procedures: None.  Consults: ID Nephrology Ophthalmology  Subjective:  Today reports pain in the left hip/knee area much improved, she does report new pain in the right knee area   Objective: Vitals: Blood pressure 113/74, pulse 87, temperature 98 F (36.7 C), temperature source Oral, resp. rate 16, height 4' 11 (1.499 m), weight 41 kg, SpO2 99%.   Exam:  Awake, frail, deconditioned Good air entry bilaterally Regular rate and rhythm Abdomen soft Bilateral BKA, right IJ HD catheter     Assessment/Plan:  L4-L5 discitis with  surrounding paraspinal inflammatory change and right psoas abscess Recent history of MRSA bacteremia/left fourth finger osteomyelitis May 2025 (strep/Bacteroides/E. coli on bone culture) Likely MRSA-although cultures this admission negative.   Evaluated by ID-recommendations are for Vancomycin  with HD x 8 weeks - EOT 9/29 Reviewed prior notes-not a surgical candidate-IR unable to drain due to lack of safe window. Repeat MRI on 01/05/2024 noted with progression of disease, ID following they have recommended IR to do a biopsy for guided sensitivity and culture, disc biopsy by IR Dr. Johann on 01/07/2024.  Results from the biopsy so far growing MRSA -   Per ID  Will continue vancomycin  as previously prescribed - EOT 9/29   Alvin Daniels has follow-up visit with me scheduled for September 16.   [name]  01/11/2024, 1:15 PM   Syncope on 8/20 Probably related to soft blood pressure/orthostatic hypotension Telemetry monitoring unremarkable.  Chronic hypotension On midodrine  Narcotics for back pain very not helping. Minimize narcotics as much as possible-avoid further escalation in narcotic dosing.  ESRD on HD MWF Nephrology following.  Normocytic anemia Secondary to ESRD Hb stable Aranesp /iron  defer to nephrology service Follow  PAD-s/p bilateral BKA Normally uses prosthesis Due to severe pain-Alvin Daniels has been unable to ambulate with a prosthesis.  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  Difficult disposition No family-uninsured-SNF not a option per SW-history of ESRD with bilateral BKA-currently unable to ambulate with his prostheses-hence unable to be  discharged  home safely.   Normally-prior to this hospitalization was able to ambulate with prosthesis and catch the bus/scat transport to go back and forth from dialysis center.   DM-2 (A1c 10.0 on 3/6) CBG stable SSI  Recent Labs    01/16/24 0837 01/16/24 0923 01/16/24 1140  GLUCAP 60* 103* 110*        Code status:   Code Status: Full Code   DVT Prophylaxis: heparin  injection 5,000 Units Start: 12/15/23 1400   Family Communication: None at bedside    Disposition Plan: Status is: Inpatient Remains inpatient appropriate because: Severe of illness   Planned Discharge Destination:Home health   Diet: Diet Order             Diet renal with fluid restriction Fluid restriction: 1200 mL Fluid; Room service appropriate? Yes; Fluid consistency: Thin  Diet effective now                   I have personally reviewed following labs and imaging studies  LABORATORY DATA:   Data Review:   Inpatient Medications  Scheduled Meds:  (feeding supplement) PROSource Plus  30 mL Oral BID BM   acetaminophen   1,000 mg Oral TID   artificial tears   Right Eye QID   bictegravir-emtricitabine -tenofovir  AF  1 tablet Oral Daily   Chlorhexidine  Gluconate Cloth  6 each Topical Q0600   darbepoetin (ARANESP ) injection - DIALYSIS  150 mcg Subcutaneous Q Fri-1800   fentaNYL   1 patch Transdermal Q72H   gabapentin   200 mg Oral TID   gatifloxacin   1 drop Right Eye BID   heparin   5,000 Units Subcutaneous Q8H   insulin  aspart  0-5 Units Subcutaneous QHS   insulin  aspart  0-6 Units Subcutaneous TID WC   lidocaine   2 patch Transdermal Q24H   methocarbamol   500 mg Oral TID   midodrine   10 mg Oral TID WC   midodrine   10 mg Oral Q T,Th,Sa-HD   pantoprazole   40 mg Oral BID AC   polyethylene glycol  17 g Oral Daily   sevelamer  carbonate  800 mg Oral TID WC   Continuous Infusions:  vancomycin  500 mg (01/15/24 1626)   PRN Meds:.glucagon  (human recombinant), hydrALAZINE , ibuprofen ,  ipratropium-albuterol , metoprolol  tartrate, ondansetron  (ZOFRAN ) IV, oxyCODONE , sodium chloride  flush  DVT Prophylaxis  heparin  injection 5,000 Units Start: 12/15/23 1400     Recent Labs  Lab 01/13/24 0850 01/15/24 1504  WBC 8.3 10.4  HGB 9.0* 9.4*  HCT 28.6* 29.3*  PLT 282 290  MCV 100.4* 98.7  MCH 31.6 31.6  MCHC 31.5 32.1  RDW 16.7* 17.1*    Recent Labs  Lab 01/13/24 0906 01/15/24 1503  NA 132* 130*  K 4.3 5.0  CL 92* 91*  CO2 21* 21*  ANIONGAP 19* 18*  GLUCOSE 121* 102*  BUN 38* 49*  CREATININE 5.81* 6.42*  ALBUMIN  2.4* 2.3*  PHOS 3.8 4.9*  CALCIUM  9.3 9.3      Recent Labs  Lab 01/13/24 0906 01/15/24 1503  CALCIUM  9.3 9.3    --------------------------------------------------------------------------------------------------------------- Lab Results  Component Value Date   CHOL 166 04/16/2023   HDL 40 04/16/2023   LDLCALC 90 04/16/2023   TRIG 277 (H) 04/16/2023   CHOLHDL 4.2 04/16/2023    Lab Results  Component Value Date   HGBA1C 10.0 (A) 07/17/2023      Signature  -   Linsay Vogt M.D on 01/16/2024 at 1:08 PM   -  To page go to [email or web address]

## 2024-01-16 NOTE — Progress Notes (Addendum)
 Initial Nutrition Assessment  DOCUMENTATION CODES:   Severe malnutrition in context of chronic illness  INTERVENTION:  Liberalize diet to maximize intake  Continue 30 ml ProSource Plus BID, each supplement provides 100 kcals and 15 grams protein.   Add Nepro Shake po BID before breakfast and at bedtime, each supplement provides 425 kcal and 19 grams protein   Add renal MVI  NUTRITION DIAGNOSIS:  Severe Malnutrition related to chronic illness as evidenced by severe muscle depletion, severe fat depletion.  GOAL:  Patient will meet greater than or equal to 90% of their needs  MONITOR:  PO intake, Supplement acceptance  REASON FOR ASSESSMENT:  Rounds Poor PO  ASSESSMENT:   Pt with PMH significant for: ESRD-HD, HIV, PAD s/p bilateral BKA, chronic HCV. Of note, recently admiteed 5/28 - 6/06 for MRSA bacteremia with left fourth finger osteomyelitis (amputation 5/29). Presenting to hospital w/ R hip pain and found to have L4-L5 osteomyelitis with abscess.  New reported pain in R knee today. Continues IV ABX. Has been on dialysis for 25 years. Unable to wear prostheses he wore previously and is not safe for discharge. Difficult placement.  Average Meal Intake 9/3: 30 % x1 documented meal  Spoke with patient at bedside with use of in person interpreter. He is resting propped up on his left side in bed attempting to drink coffee from his side table. States, at baseline, he enjoys eggs, chili peppers, tomatoes, rice and cheese, beans, spaghetti, pork chops, fish and chicken broth. Drinks flavored water. Complains about blandness and redundancy of the food at the hospital. Also states he is not eating as much as it makes him go to the bathroom and he has to wait to be cleaned.   Discussed importance of adequate calorie and protein intake to promote wound healing, prevent skin breakdown, and preserve lean body mass so that he can improve his strength. States he thinks his strength is fine. He  is amicable to receiving Nepro while admitted. Will liberalize diet to provide variety as nurse tech reports family bring him in food from outside facility regularly. Labs stable.  Admit Weight: 40.3 kg Current Weight: 41 kg Last HD tx: 9/04  UF achieved: 800 mL (3.25 hours)  Wt stable. States his UBW at dialysis clinic was 61 kg. Some significant weight loss in his hx 2/2 amputations and, therefore, cannot be used as measurable criteria to assess malnutrition. No edema on exam. Only rinseback being removed during dialysis txs. Might recommend challenge of fluid pull, as able, as he has likely lost weight d/t poor intake for reasons stated above. No SOB reported, however some generalized edema.   Drains/Lines: LUE: AVF  HDC: right internal jugular (double lumen) UOP: anuric  Sodium remains low. Could be some fluid overload or poor oral intake. PHOS/K+ within range for dialysis patient. CRP elevated on 8/25. Needs new A1c.   Meds: Biktarvy , darbepoetin alfa , fentanyl  patch, SS Novolog  0-5 QHS, SS Novolog  0-6 TID, pantoprazole , Miralax , sevelamer  carbonate, IV ABX  Labs from 9/04 reviewed: Na+ 131>132>130 (L) K+ 5.0 (wdl) PHOS 4.9 (wdl for dialysis patient) CRP 16.1 (8/25) CBGs 102-121 x24 hours A1c 10.0 (07/2023)  NUTRITION - FOCUSED PHYSICAL EXAM:  Significant muscle and fat depletions meet criteria for severe malnutrition int he context of chronic illness.   Flowsheet Row Most Recent Value  Orbital Region Severe depletion  Upper Arm Region Severe depletion  Thoracic and Lumbar Region Severe depletion  Buccal Region Severe depletion  Temple Region Severe depletion  Clavicle Bone Region Severe depletion  Clavicle and Acromion Bone Region Severe depletion  Scapular Bone Region Severe depletion  Dorsal Hand Moderate depletion  Patellar Region Unable to assess  Anterior Thigh Region Severe depletion  Posterior Calf Region Unable to assess  Edema (RD Assessment) Mild  Hair  Reviewed  Eyes Reviewed  Mouth Reviewed  [poor dentition]  Skin Reviewed  Nails Reviewed   Diet Order:   Diet Order             Diet renal with fluid restriction Fluid restriction: 1200 mL Fluid; Room service appropriate? Yes; Fluid consistency: Thin  Diet effective now             EDUCATION NEEDS:   Education needs have been addressed  Skin:  Skin Assessment: Reviewed RN Assessment  Last BM:  9/5 - type 6 x1  Height:  Ht Readings from Last 1 Encounters:  12/14/23 4' 11 (1.499 m)   Weight:  Wt Readings from Last 1 Encounters:  01/15/24 41 kg    BMI:  Body mass index is 18.26 kg/m.  Estimated Nutritional Needs:   Kcal:  1400-1600 kcals  Protein:  65-80g  Fluid:  1L + UOP  Blair Deaner MS, RD, LDN Registered Dietitian Clinical Nutrition RD Inpatient Contact Info in Amion

## 2024-01-16 NOTE — Plan of Care (Signed)

## 2024-01-17 LAB — CBC
HCT: 30.1 % — ABNORMAL LOW (ref 39.0–52.0)
Hemoglobin: 9.4 g/dL — ABNORMAL LOW (ref 13.0–17.0)
MCH: 31.5 pg (ref 26.0–34.0)
MCHC: 31.2 g/dL (ref 30.0–36.0)
MCV: 101 fL — ABNORMAL HIGH (ref 80.0–100.0)
Platelets: 207 K/uL (ref 150–400)
RBC: 2.98 MIL/uL — ABNORMAL LOW (ref 4.22–5.81)
RDW: 17.8 % — ABNORMAL HIGH (ref 11.5–15.5)
WBC: 7.2 K/uL (ref 4.0–10.5)
nRBC: 0 % (ref 0.0–0.2)

## 2024-01-17 LAB — GLUCOSE, CAPILLARY
Glucose-Capillary: 162 mg/dL — ABNORMAL HIGH (ref 70–99)
Glucose-Capillary: 93 mg/dL (ref 70–99)
Glucose-Capillary: 111 mg/dL — ABNORMAL HIGH (ref 70–99)

## 2024-01-17 LAB — RENAL FUNCTION PANEL
Albumin: 2.5 g/dL — ABNORMAL LOW (ref 3.5–5.0)
Anion gap: 19 — ABNORMAL HIGH (ref 5–15)
BUN: 37 mg/dL — ABNORMAL HIGH (ref 6–20)
CO2: 18 mmol/L — ABNORMAL LOW (ref 22–32)
Calcium: 9.5 mg/dL (ref 8.9–10.3)
Chloride: 94 mmol/L — ABNORMAL LOW (ref 98–111)
Creatinine, Ser: 5.28 mg/dL — ABNORMAL HIGH (ref 0.61–1.24)
GFR, Estimated: 12 mL/min — ABNORMAL LOW (ref >60)
Glucose, Bld: 124 mg/dL — ABNORMAL HIGH (ref 70–99)
Phosphorus: 3.7 mg/dL (ref 2.5–4.6)
Potassium: 4.3 mmol/L (ref 3.5–5.1)
Sodium: 131 mmol/L — ABNORMAL LOW (ref 135–145)

## 2024-01-17 MED ORDER — MIDODRINE HCL 5 MG PO TABS
ORAL_TABLET | ORAL | Status: AC
  Filled 2024-01-17: qty 2

## 2024-01-17 MED ORDER — LIDOCAINE HCL (PF) 1 % IJ SOLN: 5.0000 mL | INTRAMUSCULAR | Status: DC | PRN

## 2024-01-17 MED ORDER — OXYCODONE HCL 5 MG PO TABS
5.0000 mg | ORAL_TABLET | Freq: Once | ORAL | Status: AC
  Administered 2024-01-17: 5 mg via ORAL
  Filled 2024-01-17: qty 1

## 2024-01-17 MED ORDER — HEPARIN SODIUM (PORCINE) 1000 UNIT/ML DIALYSIS
1000.0000 [IU] | INTRAMUSCULAR | Status: DC | PRN
  Administered 2024-01-17: 3800 [IU]

## 2024-01-17 MED ORDER — ANTICOAGULANT SODIUM CITRATE 4% (200MG/5ML) IV SOLN
5.0000 mL | Status: DC | PRN
Start: 2024-01-17 — End: 2024-01-17

## 2024-01-17 MED ORDER — OXYCODONE HCL 5 MG PO TABS
ORAL_TABLET | ORAL | Status: AC
Start: 2024-01-17 — End: 2024-01-17
  Filled 2024-01-17: qty 2

## 2024-01-17 MED ORDER — ALTEPLASE 2 MG IJ SOLR: 2.0000 mg | Freq: Once | INTRAMUSCULAR | Status: DC | PRN

## 2024-01-17 MED ORDER — HEPARIN SODIUM (PORCINE) 1000 UNIT/ML DIALYSIS: 1000.0000 [IU] | INTRAMUSCULAR | Status: DC | PRN

## 2024-01-17 MED ORDER — LIDOCAINE-PRILOCAINE 2.5-2.5 % EX CREA: 1.0000 | TOPICAL_CREAM | CUTANEOUS | Status: DC | PRN

## 2024-01-17 MED ORDER — PENTAFLUOROPROP-TETRAFLUOROETH EX AERO: 1.0000 | INHALATION_SPRAY | CUTANEOUS | Status: DC | PRN

## 2024-01-17 MED ORDER — HEPARIN SODIUM (PORCINE) 1000 UNIT/ML IJ SOLN
INTRAMUSCULAR | Status: AC
  Filled 2024-01-17: qty 4

## 2024-01-17 NOTE — Progress Notes (Signed)
 [facility] Progress Note   Subjective:    Seen and examined patient at bedside. He reports left hip pain. Plan for HD this afternoon.  Objective Vitals:   01/17/24 1429 01/17/24 1436 01/17/24 1500 01/17/24 1530  BP: 121/69 117/78 112/79 120/75  Pulse: 63 86 81 84  Resp: 11 11 12  (!) 9  Temp: 97.8 F (36.6 C)     TempSrc:      SpO2: 98% 100% 98% 97%  Weight: 39.6 kg     Height:       Physical Exam General: Frail man, NAD. Room air. Eyelids slightly less edematous. Heart: RRR Lungs: CTAB, no rales Abdomen: soft Extremities: B BKA, no stump erythema Dialysis Access: TDC in R chest  Filed Weights   01/15/24 1422 01/15/24 1805 01/17/24 1429  Weight: 40.6 kg 41 kg 39.6 kg   No intake or output data in the 24 hours ending 01/17/24 1558  Additional Objective Labs: Basic Metabolic Panel: Recent Labs  Lab 01/13/24 0906 01/15/24 1503 01/17/24 0924  NA 132* 130* 131*  K 4.3 5.0 4.3  CL 92* 91* 94*  CO2 21* 21* 18*  GLUCOSE 121* 102* 124*  BUN 38* 49* 37*  CREATININE 5.81* 6.42* 5.28*  CALCIUM  9.3 9.3 9.5  PHOS 3.8 4.9* 3.7   Liver Function Tests: Recent Labs  Lab 01/13/24 0906 01/15/24 1503 01/17/24 0924  ALBUMIN  2.4* 2.3* 2.5*   No results for input(s): LIPASE, AMYLASE in the last 168 hours. CBC: Recent Labs  Lab 01/13/24 0850 01/15/24 1504 01/17/24 0924  WBC 8.3 10.4 7.2  HGB 9.0* 9.4* 9.4*  HCT 28.6* 29.3* 30.1*  MCV 100.4* 98.7 101.0*  PLT 282 290 207   Blood Culture    Component Value Date/Time   SDES NEEDLE ASPIRATE 01/07/2024 1612   SPECREQUEST DISC 01/07/2024 1612   CULT  01/07/2024 1612    FEW METHICILLIN RESISTANT STAPHYLOCOCCUS AUREUS NO ANAEROBES ISOLATED Performed at [facility], [address] [place], [place] [address]    REPTSTATUS 01/12/2024 FINAL 01/07/2024 1612    Cardiac Enzymes: No results for input(s): CKTOTAL, CKMB, CKMBINDEX, TROPONINI in the last 168 hours. CBG: Recent Labs  Lab  01/16/24 1140 01/16/24 1523 01/16/24 2203 01/17/24 0728 01/17/24 1127  GLUCAP 110* 119* 130* 162* 93   Iron  Studies: No results for input(s): IRON , TIBC, TRANSFERRIN, FERRITIN in the last 72 hours. Lab Results  Component Value Date   INR 1.1 01/07/2024   INR 1.1 12/15/2023   INR 1.2 10/08/2023   Studies/Results: No results found.  Medications:  vancomycin  500 mg (01/15/24 1626)    (feeding supplement) PROSource Plus  30 mL Oral BID BM   acetaminophen   1,000 mg Oral TID   artificial tears   Right Eye QID   bictegravir-emtricitabine -tenofovir  AF  1 tablet Oral Daily   Chlorhexidine  Gluconate Cloth  6 each Topical Q0600   darbepoetin (ARANESP ) injection - DIALYSIS  150 mcg Subcutaneous Q Fri-1800   feeding supplement (NEPRO CARB STEADY)  237 mL Oral BID AC & HS   fentaNYL   1 patch Transdermal Q72H   gabapentin   200 mg Oral TID   gatifloxacin   1 drop Right Eye BID   heparin   5,000 Units Subcutaneous Q8H   insulin  aspart  0-5 Units Subcutaneous QHS   insulin  aspart  0-6 Units Subcutaneous TID WC   lidocaine   2 patch Transdermal Q24H   methocarbamol   500 mg Oral TID   midodrine   10 mg Oral TID WC  midodrine   10 mg Oral Q T,Th,Sa-HD   multivitamin  1 tablet Oral QHS   pantoprazole   40 mg Oral BID AC   polyethylene glycol  17 g Oral Daily   sevelamer  carbonate  800 mg Oral TID WC    Dialysis Orders: [facility] - MWF 3.5h  B400  45.5kg  TDC  Heparin  2000 Mircera 100 mcg - last dose 12/01/23 Calcitriol  1.0 mcg (on hold due to high Ca) Uses midodrine  10mg  TID + extra pre-HD   Assessment/Plan: 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 S/p IR aspiration 8/27 - growing MRSA. Not a surgical candidate. Pain management per primary.   ESRD: Historically MWF schedule. On TTS schedule this week. Next HD this afternoon. Can go back to MWF next week. BP/volume: Chronic hypotension on mido 10mg  TID and  extra pre-HD. Below prior EDW. Anemia of ESRD: Hgb 9 - getting Aranesp  100 q week, will ^ next dose. No IV iron  d/t infection. HIV: controlled on Biktarvy  BMD: CorrCa high, VDRA on hold. Phos stable, on sevelamer  as binder.  Nutrition: Alb low, adding supps and continue renal diet w/fluid restrictions. Debility: PT has requested that we schedule him for 2nd shift whenever we can.  Dispo: Very difficult placement. Noted patient is uninsured thus SNF is not an option.  Charmaine Piety, NP [facility] 01/17/2024,3:58 PM  LOS: 33 days

## 2024-01-17 NOTE — Plan of Care (Signed)

## 2024-01-17 NOTE — Progress Notes (Signed)
 Received patient in bed to unit.  Alert and oriented.  Informed consent signed and in chart.   TX duration:3 hours and 30 minutes  Patient tolerated well.  Transported back to the room  Alert, without acute distress.  Hand-off given to patient's nurse.   Access used: Right internal jugular HD Cath Access issues: none  Total UF removed: 0mL Medication(s) given: Tylenol , oxycodone , midodrine    01/17/24 1816  Vitals  Temp 98.7 F (37.1 C)  BP 112/79  Pulse Rate 92  ECG Heart Rate 93  Resp 16  Weight 39.6 kg  Type of Weight Post-Dialysis  Oxygen Therapy  SpO2 100 %  O2 Device Room Air  During Treatment Monitoring  Duration of HD Treatment -hour(s) 3.5 hour(s)  HD Safety Checks Performed Yes  Intra-Hemodialysis Comments Tx completed  Post Treatment  Dialyzer Clearance Clear  Liters Processed 84  Fluid Removed (mL) 0 mL  Tolerated HD Treatment Yes  Post-Hemodialysis Comments COntinued Left hip pain.  COuld not give anymore than the oxycodone  and Tylenol , see [facility]  Hemodialysis Catheter Right Internal jugular Double lumen Permanent (Tunneled)  Placement Date/Time: 11/11/23 1428   Serial / Lot #: [id number]  Expiration Date: 04/14/28  Time Out: Correct patient;Correct site;Correct procedure  Maximum sterile barrier precautions: Hand hygiene;Cap;Mask;Sterile gown;Sterile gloves;Large sterile ...  Site Condition No complications  Blue Lumen Status Flushed;Antimicrobial dead end cap;Heparin  locked  Red Lumen Status Flushed;Antimicrobial dead end cap;Heparin  locked  Purple Lumen Status N/A  Catheter fill solution Heparin  1000 units/ml  Catheter fill volume (Arterial) 1.9 cc  Catheter fill volume (Venous) 1.9  Dressing Type Transparent  Dressing Status Antimicrobial disc/dressing in place;Clean, Dry, Intact  Drainage Description None  Dressing Change Due 01/20/24  Post treatment catheter status Capped and Clamped     Camellia Brasil LPN [facility]

## 2024-01-17 NOTE — Progress Notes (Signed)
 PROGRESS NOTE        PATIENT DETAILS Name: Alvin Daniels Age: 53 y.o. Sex: male Date of Birth: December 15, 1970 Admit Date: 12/14/2023 Admitting Physician Marsha Ada, MD PCP:Pcp, No  Brief Summary:  Patient is a 53 y.o.  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  Significant events: 8/4>> admit to [facility]  Significant studies: 6/4>> TEE: EF 60-65%, 1.8 x 1.2 mass right atrial wall-?vegetation from old HD catheter impinged on RA wall-versus mass/tumor. 8/4>> MRI pelvis: Discitis/osteomyelitis L4-5-right psoas abscess. 8/5>> CT abdomen/pelvis: Discitis/osteomyelitis L4-5 with paraspinal inflammatory changes/right psoas abscess.  8/8>> MRI LS spine: L4-5 discitis/osteomyelitis with associated paraspinal inflammatory changes, right psoas abscess. 8/20>> MRI left hip: Discitis of the lower lumbar spine with extension into the psoas muscle. 8/20>> BKA-no significant abnormality.  Significant microbiology data: 5/28>> blood culture: MRSA 5/29>> left finger abscess: E. coli/Streptococcus anginosus, Bacteroides fragilis. 8/4>> blood culture: No growth  Procedures: None.  Consults: ID Nephrology Ophthalmology  Subjective:  Patient reports some mild left hip pain.  He denies any complaints today  Objective: Vitals: Blood pressure 117/78, pulse 86, temperature 97.8 F (36.6 C), resp. rate 11, height 4' 11 (1.499 m), weight 39.6 kg, SpO2 100%.   Exam:  Awake, frail, deconditioned Good air entry Regular rate and rhythm Abdomen soft Bilateral BKA, right IJ HD catheter     Assessment/Plan:  L4-L5 discitis with surrounding paraspinal inflammatory change and right psoas abscess Recent  history of MRSA bacteremia/left fourth finger osteomyelitis May 2025 (strep/Bacteroides/E. coli on bone culture) Likely MRSA-although cultures this admission negative.   Evaluated by ID-recommendations are for Vancomycin  with HD x 8 weeks - EOT 9/29 Reviewed prior notes-not a surgical candidate-IR unable to drain due to lack of safe window. Repeat MRI on 01/05/2024 noted with progression of disease, ID following they have recommended IR to do a biopsy for guided sensitivity and culture, disc biopsy by IR Dr. Johann on 01/07/2024.  Results from the biopsy so far growing MRSA -   Per ID  Will continue vancomycin  as previously prescribed - EOT 9/29   He has follow-up visit with me scheduled for September 16.   Alvin Daniels  01/11/2024, 1:15 PM   Syncope on 8/20 Probably related to soft blood pressure/orthostatic hypotension Telemetry monitoring unremarkable.  Chronic hypotension On midodrine  Narcotics for back pain very not helping. Minimize narcotics as much as possible-avoid further escalation in narcotic dosing.  ESRD on HD MWF Nephrology following.  Normocytic anemia Secondary to ESRD Hb stable Aranesp /iron  defer to nephrology service Follow  PAD-s/p bilateral BKA Normally uses prosthesis Due to severe pain-he has been unable to ambulate with a prosthesis.  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  Difficult disposition No family-uninsured-SNF not a option per SW-history of ESRD with bilateral BKA-currently unable to ambulate with his prostheses-hence unable to be discharged home safely.   Normally-prior to this hospitalization was able to  ambulate  with prosthesis and catch the bus/scat transport to go back and forth from dialysis center.   DM-2 (A1c 10.0 on 3/6) CBG stable SSI  Recent Labs    01/16/24 2203 01/17/24 0728 01/17/24 1127  GLUCAP 130* 162* 93        Code status:   Code Status: Full Code   DVT Prophylaxis: heparin  injection 5,000 Units Start: 12/15/23 1400   Family Communication: None at bedside    Disposition Plan: Status is: Inpatient Remains inpatient appropriate because: Severe of illness   Planned Discharge Destination:Home health   Diet: Diet Order             Diet regular Room service appropriate? Yes with Assist; Fluid consistency: Thin; Fluid restriction: 1200 mL Fluid  Diet effective now                   I have personally reviewed following labs and imaging studies  LABORATORY DATA:   Data Review:   Inpatient Medications  Scheduled Meds:  (feeding supplement) PROSource Plus  30 mL Oral BID BM   acetaminophen   1,000 mg Oral TID   artificial tears   Right Eye QID   bictegravir-emtricitabine -tenofovir  AF  1 tablet Oral Daily   Chlorhexidine  Gluconate Cloth  6 each Topical Q0600   darbepoetin (ARANESP ) injection - DIALYSIS  150 mcg Subcutaneous Q Fri-1800   feeding supplement (NEPRO CARB STEADY)  237 mL Oral BID AC & HS   fentaNYL   1 patch Transdermal Q72H   gabapentin   200 mg Oral TID   gatifloxacin   1 drop Right Eye BID   heparin   5,000 Units Subcutaneous Q8H   insulin  aspart  0-5 Units Subcutaneous QHS   insulin  aspart  0-6 Units Subcutaneous TID WC   lidocaine   2 patch Transdermal Q24H   methocarbamol   500 mg Oral TID   midodrine   10 mg Oral TID WC   midodrine   10 mg Oral Q T,Th,Sa-HD   multivitamin  1 tablet Oral QHS   pantoprazole   40 mg Oral BID AC   polyethylene glycol  17 g Oral Daily   sevelamer  carbonate  800 mg Oral TID WC   Continuous Infusions:  vancomycin  500 mg (01/15/24 1626)   PRN Meds:.glucagon  (human recombinant), hydrALAZINE ,  ibuprofen , ipratropium-albuterol , metoprolol  tartrate, ondansetron  (ZOFRAN ) IV, oxyCODONE , sodium chloride  flush  DVT Prophylaxis  heparin  injection 5,000 Units Start: 12/15/23 1400     Recent Labs  Lab 01/13/24 0850 01/15/24 1504 01/17/24 0924  WBC 8.3 10.4 7.2  HGB 9.0* 9.4* 9.4*  HCT 28.6* 29.3* 30.1*  PLT 282 290 207  MCV 100.4* 98.7 101.0*  MCH 31.6 31.6 31.5  MCHC 31.5 32.1 31.2  RDW 16.7* 17.1* 17.8*    Recent Labs  Lab 01/13/24 0906 01/15/24 1503 01/17/24 0924  NA 132* 130* 131*  K 4.3 5.0 4.3  CL 92* 91* 94*  CO2 21* 21* 18*  ANIONGAP 19* 18* 19*  GLUCOSE 121* 102* 124*  BUN 38* 49* 37*  CREATININE 5.81* 6.42* 5.28*  ALBUMIN  2.4* 2.3* 2.5*  PHOS 3.8 4.9* 3.7  CALCIUM  9.3 9.3 9.5      Recent Labs  Lab 01/13/24 0906 01/15/24 1503 01/17/24 0924  CALCIUM  9.3 9.3 9.5    --------------------------------------------------------------------------------------------------------------- Lab Results  Component Value Date   CHOL 166 04/16/2023   HDL 40 04/16/2023   LDLCALC 90 04/16/2023   TRIG 277 (H) 04/16/2023   CHOLHDL 4.2 04/16/2023    Lab Results  Component Value Date  HGBA1C 10.0 (A) 07/17/2023      Signature  -   Garner Dullea M.D on 01/17/2024 at 2:41 PM   -  To page go to [email or web address]

## 2024-01-18 LAB — GLUCOSE, CAPILLARY
Glucose-Capillary: 130 mg/dL — ABNORMAL HIGH (ref 70–99)
Glucose-Capillary: 152 mg/dL — ABNORMAL HIGH (ref 70–99)
Glucose-Capillary: 118 mg/dL — ABNORMAL HIGH (ref 70–99)
Glucose-Capillary: 153 mg/dL — ABNORMAL HIGH (ref 70–99)

## 2024-01-18 MED ORDER — CHLORHEXIDINE GLUCONATE CLOTH 2 % EX PADS
6.0000 | MEDICATED_PAD | Freq: Every day | CUTANEOUS | Status: DC
  Administered 2024-01-19: 6 via TOPICAL

## 2024-01-18 MED ORDER — MIDODRINE HCL 5 MG PO TABS
10.0000 mg | ORAL_TABLET | ORAL | Status: DC
  Administered 2024-01-20: 10 mg via ORAL

## 2024-01-18 NOTE — Progress Notes (Addendum)
 [facility] Progress Note   Subjective:    Seen and examined patient at bedside. Patient's friends also at bedside. He's c/o ongoing left hip pain and just received pain medication. Tolerated yesterday's HD with no UF. Bps are stable. Next HD 9/8 per his routine schedule.  Objective Vitals:   01/18/24 0359 01/18/24 0913 01/18/24 1125 01/18/24 1214  BP: 101/75 101/73 128/79   Pulse: 85 91 91 91  Resp:  17 17 17   Temp: 97.9 F (36.6 C) 98.1 F (36.7 C) 97.7 F (36.5 C)   TempSrc: Oral Oral Oral   SpO2:  98% 100% 100%  Weight:      Height:       Physical Exam General: Frail man, NAD. Room air. Eyelids slightly less edematous. Heart: RRR Lungs: CTAB, no rales Abdomen: soft Extremities: B BKA, no stump erythema Dialysis Access: TDC in R chest  Filed Weights   01/15/24 1805 01/17/24 1429 01/17/24 1816  Weight: 41 kg 39.6 kg 39.6 kg    Intake/Output Summary (Last 24 hours) at 01/18/2024 1516 Last data filed at 01/17/2024 1816 Gross per 24 hour  Intake --  Output 0 ml  Net 0 ml    Additional Objective Labs: Basic Metabolic Panel: Recent Labs  Lab 01/13/24 0906 01/15/24 1503 01/17/24 0924  NA 132* 130* 131*  K 4.3 5.0 4.3  CL 92* 91* 94*  CO2 21* 21* 18*  GLUCOSE 121* 102* 124*  BUN 38* 49* 37*  CREATININE 5.81* 6.42* 5.28*  CALCIUM  9.3 9.3 9.5  PHOS 3.8 4.9* 3.7   Liver Function Tests: Recent Labs  Lab 01/13/24 0906 01/15/24 1503 01/17/24 0924  ALBUMIN  2.4* 2.3* 2.5*   No results for input(s): LIPASE, AMYLASE in the last 168 hours. CBC: Recent Labs  Lab 01/13/24 0850 01/15/24 1504 01/17/24 0924  WBC 8.3 10.4 7.2  HGB 9.0* 9.4* 9.4*  HCT 28.6* 29.3* 30.1*  MCV 100.4* 98.7 101.0*  PLT 282 290 207   Blood Culture    Component Value Date/Time   SDES NEEDLE ASPIRATE 01/07/2024 1612   SPECREQUEST DISC 01/07/2024 1612   CULT  01/07/2024 1612    FEW METHICILLIN RESISTANT STAPHYLOCOCCUS AUREUS NO ANAEROBES ISOLATED Performed at  [facility], [address] [place], [place] [address]    REPTSTATUS 01/12/2024 FINAL 01/07/2024 1612    Cardiac Enzymes: No results for input(s): CKTOTAL, CKMB, CKMBINDEX, TROPONINI in the last 168 hours. CBG: Recent Labs  Lab 01/17/24 0728 01/17/24 1127 01/17/24 2119 01/18/24 0910 01/18/24 1123  GLUCAP 162* 93 111* 130* 152*   Iron  Studies: No results for input(s): IRON , TIBC, TRANSFERRIN, FERRITIN in the last 72 hours. Lab Results  Component Value Date   INR 1.1 01/07/2024   INR 1.1 12/15/2023   INR 1.2 10/08/2023   Studies/Results: No results found.  Medications:  vancomycin  500 mg (01/17/24 1954)    (feeding supplement) PROSource Plus  30 mL Oral BID BM   acetaminophen   1,000 mg Oral TID   artificial tears   Right Eye QID   bictegravir-emtricitabine -tenofovir  AF  1 tablet Oral Daily   Chlorhexidine  Gluconate Cloth  6 each Topical Q0600   darbepoetin (ARANESP ) injection - DIALYSIS  150 mcg Subcutaneous Q Fri-1800   feeding supplement (NEPRO CARB STEADY)  237 mL Oral BID AC & HS   fentaNYL   1 patch Transdermal Q72H   gabapentin   200 mg Oral TID   gatifloxacin   1 drop Right Eye BID   heparin   5,000 Units Subcutaneous  Q8H   insulin  aspart  0-5 Units Subcutaneous QHS   insulin  aspart  0-6 Units Subcutaneous TID WC   lidocaine   2 patch Transdermal Q24H   methocarbamol   500 mg Oral TID   midodrine   10 mg Oral TID WC   midodrine   10 mg Oral Q T,Th,Sa-HD   multivitamin  1 tablet Oral QHS   pantoprazole   40 mg Oral BID AC   polyethylene glycol  17 g Oral Daily   sevelamer  carbonate  800 mg Oral TID WC    Dialysis Orders: [facility] - MWF 3.5h  B400  45.5kg  TDC  Heparin  2000 Mircera 100 mcg - last dose 12/01/23 Calcitriol  1.0 mcg (on hold due to high Ca) Uses midodrine  10mg  TID + extra pre-HD   Assessment/Plan: 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 S/p IR  aspiration 8/27 - growing MRSA. Not a surgical candidate. Pain management per primary.   ESRD: Historically MWF schedule. On TTS schedule this week. Next HD 9/8 to get back on his routine schedule. BP/volume: Chronic hypotension on mido 10mg  TID and extra pre-HD. Below EDW and will need to be lowered at dc. D/w RD over weekend. Likely d/t malnutrition. Diet was liberalized. Monitor labs. Anemia of ESRD: Hgb 9.4 - Received Aranesp  on 9/5. No IV iron  d/t infection. HIV: controlled on Biktarvy  BMD: CorrCa high, VDRA on hold. Phos stable, on sevelamer  as binder.  Nutrition: Alb low, adding supps and continue renal diet w/fluid restrictions. Debility: PT has requested that we schedule him for 2nd shift whenever we can.  Dispo: Very difficult placement. Noted patient is uninsured thus SNF is not an option.   Charmaine Piety, NP [facility] 01/18/2024,3:16 PM  LOS: 34 days

## 2024-01-18 NOTE — Progress Notes (Signed)
 PROGRESS NOTE        PATIENT DETAILS Name: Alvin Daniels Age: 53 y.o. Sex: male Date of Birth: 1971/02/01 Admit Date: 12/14/2023 Admitting Physician Marsha Ada, MD PCP:Pcp, No  Brief Summary:  Patient is a 53 y.o.  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  Significant events: 8/4>> admit to [facility]  Significant studies: 6/4>> TEE: EF 60-65%, 1.8 x 1.2 mass right atrial wall-?vegetation from old HD catheter impinged on RA wall-versus mass/tumor. 8/4>> MRI pelvis: Discitis/osteomyelitis L4-5-right psoas abscess. 8/5>> CT abdomen/pelvis: Discitis/osteomyelitis L4-5 with paraspinal inflammatory changes/right psoas abscess.  8/8>> MRI LS spine: L4-5 discitis/osteomyelitis with associated paraspinal inflammatory changes, right psoas abscess. 8/20>> MRI left hip: Discitis of the lower lumbar spine with extension into the psoas muscle. 8/20>> BKA-no significant abnormality.  Significant microbiology data: 5/28>> blood culture: MRSA 5/29>> left finger abscess: E. coli/Streptococcus anginosus, Bacteroides fragilis. 8/4>> blood culture: No growth  Procedures: None.  Consults: ID Nephrology Ophthalmology  Subjective:  He is complaining of left hip pain today, tolerated HD yesterday.  Objective: Vitals: Blood pressure 128/79, pulse 91, temperature 97.7 F (36.5 C), temperature source Oral, resp. rate 17, height 4' 11 (1.499 m), weight 39.6 kg, SpO2 100%.   Exam:  Awake, frail, deconditioned, no apparent distress Good air entry Regular rate and rhythm Abdomen soft Bilateral BKA   Assessment/Plan:  L4-L5 discitis with surrounding paraspinal inflammatory change and right psoas  abscess Recent history of MRSA bacteremia/left fourth finger osteomyelitis May 2025 (strep/Bacteroides/E. coli on bone culture) Likely MRSA-although cultures this admission negative.   Evaluated by ID-recommendations are for Vancomycin  with HD x 8 weeks - EOT 9/29 Reviewed prior notes-not a surgical candidate-IR unable to drain due to lack of safe window. Repeat MRI on 01/05/2024 noted with progression of disease, ID following they have recommended IR to do a biopsy for guided sensitivity and culture, disc biopsy by IR Dr. Johann on 01/07/2024.  Results from the biopsy so far growing MRSA -   Per ID  Will continue vancomycin  as previously prescribed - EOT 9/29   He has follow-up visit with me scheduled for September 16.   Alvin Daniels  01/11/2024, 1:15 PM   Syncope on 8/20 Probably related to soft blood pressure/orthostatic hypotension Telemetry monitoring unremarkable.  Chronic hypotension On midodrine  Narcotics for back pain very not helping. Minimize narcotics as much as possible-avoid further escalation in narcotic dosing.  ESRD on HD MWF Nephrology following.  Normocytic anemia Secondary to ESRD Hb stable Aranesp /iron  defer to nephrology service Follow  PAD-s/p bilateral BKA Normally uses prosthesis Due to severe pain-he has been unable to ambulate with a prosthesis.  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  Difficult disposition No family-uninsured-SNF not a option per SW-history of ESRD with bilateral BKA-currently unable to ambulate with his prostheses-hence unable to be discharged home safely.   Normally-prior to this  hospitalization was able to ambulate with prosthesis  and catch the bus/scat transport to go back and forth from dialysis center.   DM-2 (A1c 10.0 on 3/6) CBG stable SSI  Recent Labs    01/17/24 2119 01/18/24 0910 01/18/24 1123  GLUCAP 111* 130* 152*        Code status:   Code Status: Full Code   DVT Prophylaxis: heparin  injection 5,000 Units Start: 12/15/23 1400   Family Communication: None at bedside    Disposition Plan: Status is: Inpatient Remains inpatient appropriate because: Severe of illness   Planned Discharge Destination:Home health   Diet: Diet Order             Diet regular Room service appropriate? Yes with Assist; Fluid consistency: Thin; Fluid restriction: 1200 mL Fluid  Diet effective now                   I have personally reviewed following labs and imaging studies  LABORATORY DATA:   Data Review:   Inpatient Medications  Scheduled Meds:  (feeding supplement) PROSource Plus  30 mL Oral BID BM   acetaminophen   1,000 mg Oral TID   artificial tears   Right Eye QID   bictegravir-emtricitabine -tenofovir  AF  1 tablet Oral Daily   Chlorhexidine  Gluconate Cloth  6 each Topical Q0600   darbepoetin (ARANESP ) injection - DIALYSIS  150 mcg Subcutaneous Q Fri-1800   feeding supplement (NEPRO CARB STEADY)  237 mL Oral BID AC & HS   fentaNYL   1 patch Transdermal Q72H   gabapentin   200 mg Oral TID   gatifloxacin   1 drop Right Eye BID   heparin   5,000 Units Subcutaneous Q8H   insulin  aspart  0-5 Units Subcutaneous QHS   insulin  aspart  0-6 Units Subcutaneous TID WC   lidocaine   2 patch Transdermal Q24H   methocarbamol   500 mg Oral TID   midodrine   10 mg Oral TID WC   midodrine   10 mg Oral Q T,Th,Sa-HD   multivitamin  1 tablet Oral QHS   pantoprazole   40 mg Oral BID AC   polyethylene glycol  17 g Oral Daily   sevelamer  carbonate  800 mg Oral TID WC   Continuous Infusions:  vancomycin  500 mg (01/17/24 1954)   PRN Meds:.glucagon  (human  recombinant), hydrALAZINE , ibuprofen , ipratropium-albuterol , metoprolol  tartrate, ondansetron  (ZOFRAN ) IV, oxyCODONE , sodium chloride  flush  DVT Prophylaxis  heparin  injection 5,000 Units Start: 12/15/23 1400     Recent Labs  Lab 01/13/24 0850 01/15/24 1504 01/17/24 0924  WBC 8.3 10.4 7.2  HGB 9.0* 9.4* 9.4*  HCT 28.6* 29.3* 30.1*  PLT 282 290 207  MCV 100.4* 98.7 101.0*  MCH 31.6 31.6 31.5  MCHC 31.5 32.1 31.2  RDW 16.7* 17.1* 17.8*    Recent Labs  Lab 01/13/24 0906 01/15/24 1503 01/17/24 0924  NA 132* 130* 131*  K 4.3 5.0 4.3  CL 92* 91* 94*  CO2 21* 21* 18*  ANIONGAP 19* 18* 19*  GLUCOSE 121* 102* 124*  BUN 38* 49* 37*  CREATININE 5.81* 6.42* 5.28*  ALBUMIN  2.4* 2.3* 2.5*  PHOS 3.8 4.9* 3.7  CALCIUM  9.3 9.3 9.5      Recent Labs  Lab 01/13/24 0906 01/15/24 1503 01/17/24 0924  CALCIUM  9.3 9.3 9.5    --------------------------------------------------------------------------------------------------------------- Lab Results  Component Value Date   CHOL 166 04/16/2023   HDL 40 04/16/2023   LDLCALC 90 04/16/2023   TRIG 277 (H) 04/16/2023   CHOLHDL 4.2 04/16/2023    Lab Results  Component Value Date   HGBA1C 10.0 (  A) 07/17/2023      Signature  -   Shaquille Murdy M.D on 01/18/2024 at 1:58 PM   -  To page go to [email or web address]

## 2024-01-18 NOTE — Plan of Care (Signed)
   Problem: Clinical Measurements: Goal: Diagnostic test results will improve Outcome: Progressing   Problem: Pain Managment: Goal: General experience of comfort will improve and/or be controlled Outcome: Progressing   Problem: Safety: Goal: Ability to remain free from injury will improve Outcome: Progressing

## 2024-01-19 LAB — CBC WITH DIFFERENTIAL/PLATELET
Abs Immature Granulocytes: 0.04 K/uL (ref 0.00–0.07)
Basophils Absolute: 0.1 K/uL (ref 0.0–0.1)
Basophils Relative: 1 %
Eosinophils Absolute: 0 K/uL (ref 0.0–0.5)
Eosinophils Relative: 0 %
HCT: 29.5 % — ABNORMAL LOW (ref 39.0–52.0)
Hemoglobin: 9.3 g/dL — ABNORMAL LOW (ref 13.0–17.0)
Immature Granulocytes: 1 %
Lymphocytes Relative: 16 %
Lymphs Abs: 1.2 K/uL (ref 0.7–4.0)
MCH: 31.2 pg (ref 26.0–34.0)
MCHC: 31.5 g/dL (ref 30.0–36.0)
MCV: 99 fL (ref 80.0–100.0)
Monocytes Absolute: 0.7 K/uL (ref 0.1–1.0)
Monocytes Relative: 10 %
Neutro Abs: 5.5 K/uL (ref 1.7–7.7)
Neutrophils Relative %: 72 %
Platelets: 187 K/uL (ref 150–400)
RBC: 2.98 MIL/uL — ABNORMAL LOW (ref 4.22–5.81)
RDW: 17.3 % — ABNORMAL HIGH (ref 11.5–15.5)
WBC: 7.6 K/uL (ref 4.0–10.5)
nRBC: 0 % (ref 0.0–0.2)

## 2024-01-19 LAB — RENAL FUNCTION PANEL
Albumin: 2.3 g/dL — ABNORMAL LOW (ref 3.5–5.0)
Anion gap: 19 — ABNORMAL HIGH (ref 5–15)
BUN: 29 mg/dL — ABNORMAL HIGH (ref 6–20)
CO2: 21 mmol/L — ABNORMAL LOW (ref 22–32)
Calcium: 9.6 mg/dL (ref 8.9–10.3)
Chloride: 90 mmol/L — ABNORMAL LOW (ref 98–111)
Creatinine, Ser: 4.24 mg/dL — ABNORMAL HIGH (ref 0.61–1.24)
GFR, Estimated: 16 mL/min — ABNORMAL LOW (ref >60)
Glucose, Bld: 159 mg/dL — ABNORMAL HIGH (ref 70–99)
Phosphorus: 3.7 mg/dL (ref 2.5–4.6)
Potassium: 3.9 mmol/L (ref 3.5–5.1)
Sodium: 130 mmol/L — ABNORMAL LOW (ref 135–145)

## 2024-01-19 LAB — GLUCOSE, CAPILLARY
Glucose-Capillary: 148 mg/dL — ABNORMAL HIGH (ref 70–99)
Glucose-Capillary: 183 mg/dL — ABNORMAL HIGH (ref 70–99)
Glucose-Capillary: 119 mg/dL — ABNORMAL HIGH (ref 70–99)
Glucose-Capillary: 164 mg/dL — ABNORMAL HIGH (ref 70–99)

## 2024-01-19 NOTE — Plan of Care (Signed)
  Problem: Education: Goal: Knowledge of General Education information will improve Description: Including pain rating scale, medication(s)/side effects and non-pharmacologic comfort measures Outcome: Not Progressing   Problem: Health Behavior/Discharge Planning: Goal: Ability to manage health-related needs will improve Outcome: Not Progressing   Problem: Clinical Measurements: Goal: Ability to maintain clinical measurements within normal limits will improve Outcome: Not Progressing Goal: Will remain free from infection Outcome: Not Progressing Goal: Diagnostic test results will improve Outcome: Not Progressing Goal: Respiratory complications will improve Outcome: Not Progressing Goal: Cardiovascular complication will be avoided Outcome: Not Progressing   Problem: Nutrition: Goal: Adequate nutrition will be maintained Outcome: Not Progressing   Problem: Coping: Goal: Level of anxiety will decrease Outcome: Not Progressing   Problem: Elimination: Goal: Will not experience complications related to bowel motility Outcome: Not Progressing Goal: Will not experience complications related to urinary retention Outcome: Not Progressing   Problem: Pain Managment: Goal: General experience of comfort will improve and/or be controlled Outcome: Not Progressing   Problem: Safety: Goal: Ability to remain free from injury will improve Outcome: Not Progressing   Problem: Skin Integrity: Goal: Risk for impaired skin integrity will decrease Outcome: Not Progressing   Problem: Education: Goal: Ability to describe self-care measures that may prevent or decrease complications (Diabetes Survival Skills Education) will improve Outcome: Not Progressing Goal: Individualized Educational Video(s) Outcome: Not Progressing   Problem: Health Behavior/Discharge Planning: Goal: Ability to identify and utilize available resources and services will improve Outcome: Not Progressing Goal: Ability  to manage health-related needs will improve Outcome: Not Progressing   Problem: Metabolic: Goal: Ability to maintain appropriate glucose levels will improve Outcome: Not Progressing

## 2024-01-19 NOTE — TOC Progression Note (Signed)
 Transition of Care [facility]) - Progression Note    Patient Details  Name: Alvin Daniels MRN: [id number] Date of Birth: 1970/12/06  [facility] CM/SW Contact  Inocente GORMAN Kindle, LCSW Phone Number: 01/19/2024, 1:40 PM  Clinical Narrative:    CSW sent request to [employer] for an update on patient's Medicaid screening.    Expected Discharge Plan: Home/Self Care Barriers to Discharge: Continued Medical Work up, Inadequate or no insurance (does not have adequate home support in place)               Expected Discharge Plan and Services In-house Referral: Clinical Social Work     Living arrangements for the past 2 months: Single Family Home                                       Social Drivers of Health (SDOH) Interventions SDOH Screenings   Food Insecurity: No Food Insecurity (12/15/2023)  Housing: Low Risk  (12/15/2023)  Transportation Needs: No Transportation Needs (12/15/2023)  Utilities: Not At Risk (12/15/2023)  Alcohol  Screen: Low Risk  (07/17/2023)  Depression (PHQ2-9): Low Risk  (07/17/2023)  Financial Resource Strain: Low Risk  (01/29/2023)  Physical Activity: Inactive (01/29/2023)  Social Connections: Moderately Isolated (10/08/2023)  Stress: No Stress Concern Present (01/29/2023)  Tobacco Use: Low Risk  (12/14/2023)  Health Literacy: Adequate Health Literacy (01/29/2023)    Readmission Risk Interventions     No data to display

## 2024-01-19 NOTE — Plan of Care (Signed)
  Problem: Education: Goal: Knowledge of General Education information will improve Description: Including pain rating scale, medication(s)/side effects and non-pharmacologic comfort measures Outcome: Progressing   Problem: Activity: Goal: Risk for activity intolerance will decrease Outcome: Progressing   Problem: Pain Managment: Goal: General experience of comfort will improve and/or be controlled Outcome: Progressing

## 2024-01-19 NOTE — Progress Notes (Signed)
 PROGRESS NOTE        PATIENT DETAILS Name: Alvin Daniels Age: 53 y.o. Sex: male Date of Birth: [birth year] Admit Date: 12/14/2023 Admitting Physician Marsha Ada, MD PCP:Pcp, No  Brief Summary:  Patient is a 53 y.o.  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  Significant events: 8/4>> admit to [facility]  Significant studies: 6/4>> TEE: EF 60-65%, 1.8 x 1.2 mass right atrial wall-?vegetation from old HD catheter impinged on RA wall-versus mass/tumor. 8/4>> MRI pelvis: Discitis/osteomyelitis L4-5-right psoas abscess. 8/5>> CT abdomen/pelvis: Discitis/osteomyelitis L4-5 with paraspinal inflammatory changes/right psoas abscess.  8/8>> MRI LS spine: L4-5 discitis/osteomyelitis with associated paraspinal inflammatory changes, right psoas abscess. 8/20>> MRI left hip: Discitis of the lower lumbar spine with extension into the psoas muscle. 8/20>> BKA-no significant abnormality.  Significant microbiology data: 5/28>> blood culture: MRSA 5/29>> left finger abscess: E. coli/Streptococcus anginosus, Bacteroides fragilis. 8/4>> blood culture: No growth  Procedures: None.  Consults: ID Nephrology Ophthalmology  Subjective:  Reports left hip pain has improved, but he is complaining of left knee pain today.  Objective: Vitals: Blood pressure (!) 89/69, pulse 82, temperature 98 F (36.7 C), temperature source Oral, resp. rate 17, height 4' 11 (1.499 m), weight 39.6 kg, SpO2 97%.   Exam:  Awake, frail, deconditioned, no apparent distress Good air entry Regular rate and rhythm Bilateral BKA   Assessment/Plan:  L4-L5 discitis with surrounding paraspinal inflammatory change and right psoas  abscess Recent history of MRSA bacteremia/left fourth finger osteomyelitis May 2025 (strep/Bacteroides/E. coli on bone culture) Likely MRSA-although cultures this admission negative.   Evaluated by ID-recommendations are for Vancomycin  with HD x 8 weeks - EOT 9/29 Reviewed prior notes-not a surgical candidate-IR unable to drain due to lack of safe window. Repeat MRI on 01/05/2024 noted with progression of disease, ID following they have recommended IR to do a biopsy for guided sensitivity and culture, disc biopsy by IR Dr. Johann on 01/07/2024.  Results from the biopsy so far growing MRSA -   Per ID  Will continue vancomycin  as previously prescribed - EOT 9/29   He has follow-up visit with me scheduled for September 16.   Alvin Daniels  01/11/2024, 1:15 PM   Syncope on 8/20 Probably related to soft blood pressure/orthostatic hypotension Telemetry monitoring unremarkable.  Chronic hypotension On midodrine  Narcotics for back pain very not helping. Minimize narcotics as much as possible-avoid further escalation in narcotic dosing.  ESRD on HD MWF Nephrology following.  Normocytic anemia Secondary to ESRD Hb stable Aranesp /iron  defer to nephrology service Follow  PAD-s/p bilateral BKA Normally uses prosthesis Due to severe pain-he has been unable to ambulate with a prosthesis.  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  Difficult disposition No family-uninsured-SNF not a option per SW-history of ESRD with bilateral BKA-currently unable to ambulate with his prostheses-hence unable to be discharged home safely.   Normally-prior to this  hospitalization was able to  ambulate with prosthesis and catch the bus/scat transport to go back and forth from dialysis center.   DM-2 (A1c 10.0 on 3/6) CBG stable SSI  Recent Labs    01/18/24 2118 01/19/24 0751 01/19/24 1204  GLUCAP 153* 148* 183*        Code status:   Code Status: Full Code   DVT Prophylaxis: heparin  injection 5,000 Units Start: 12/15/23 1400   Family Communication: None at bedside    Disposition Plan: Status is: Inpatient Remains inpatient appropriate because: Severe of illness   Planned Discharge Destination:Home health   Diet: Diet Order             Diet regular Room service appropriate? Yes with Assist; Fluid consistency: Thin; Fluid restriction: 1200 mL Fluid  Diet effective now                   I have personally reviewed following labs and imaging studies  LABORATORY DATA:   Data Review:   Inpatient Medications  Scheduled Meds:  (feeding supplement) PROSource Plus  30 mL Oral BID BM   acetaminophen   1,000 mg Oral TID   artificial tears   Right Eye QID   bictegravir-emtricitabine -tenofovir  AF  1 tablet Oral Daily   Chlorhexidine  Gluconate Cloth  6 each Topical Q0600   darbepoetin (ARANESP ) injection - DIALYSIS  150 mcg Subcutaneous Q Fri-1800   feeding supplement (NEPRO CARB STEADY)  237 mL Oral BID AC & HS   fentaNYL   1 patch Transdermal Q72H   gabapentin   200 mg Oral TID   gatifloxacin   1 drop Right Eye BID   heparin   5,000 Units Subcutaneous Q8H   insulin  aspart  0-5 Units Subcutaneous QHS   insulin  aspart  0-6 Units Subcutaneous TID WC   lidocaine   2 patch Transdermal Q24H   methocarbamol   500 mg Oral TID   midodrine   10 mg Oral TID WC   midodrine   10 mg Oral Q M,W,F-HD   multivitamin  1 tablet Oral QHS   pantoprazole   40 mg Oral BID AC   polyethylene glycol  17 g Oral Daily   sevelamer  carbonate  800 mg Oral TID WC   Continuous Infusions:  vancomycin  500 mg (01/17/24 1954)   PRN Meds:.glucagon  (human  recombinant), hydrALAZINE , ibuprofen , ipratropium-albuterol , metoprolol  tartrate, ondansetron  (ZOFRAN ) IV, oxyCODONE , sodium chloride  flush  DVT Prophylaxis  heparin  injection 5,000 Units Start: 12/15/23 1400     Recent Labs  Lab 01/13/24 0850 01/15/24 1504 01/17/24 0924 01/19/24 0603  WBC 8.3 10.4 7.2 7.6  HGB 9.0* 9.4* 9.4* 9.3*  HCT 28.6* 29.3* 30.1* 29.5*  PLT 282 290 207 187  MCV 100.4* 98.7 101.0* 99.0  MCH 31.6 31.6 31.5 31.2  MCHC 31.5 32.1 31.2 31.5  RDW 16.7* 17.1* 17.8* 17.3*  LYMPHSABS  --   --   --  1.2  MONOABS  --   --   --  0.7  EOSABS  --   --   --  0.0  BASOSABS  --   --   --  0.1    Recent Labs  Lab 01/13/24 0906 01/15/24 1503 01/17/24 0924 01/19/24 0603  NA 132* 130* 131* 130*  K 4.3 5.0 4.3 3.9  CL 92* 91* 94* 90*  CO2 21* 21* 18* 21*  ANIONGAP 19* 18* 19* 19*  GLUCOSE 121* 102* 124* 159*  BUN 38* 49* 37* 29*  CREATININE 5.81* 6.42* 5.28* 4.24*  ALBUMIN  2.4* 2.3* 2.5* 2.3*  PHOS 3.8 4.9* 3.7 3.7  CALCIUM  9.3 9.3 9.5 9.6      Recent Labs  Lab 01/13/24 0906 01/15/24 1503 01/17/24 0924 01/19/24 0603  CALCIUM  9.3 9.3 9.5 9.6    --------------------------------------------------------------------------------------------------------------- Lab Results  Component Value Date   CHOL 166 04/16/2023   HDL 40 04/16/2023   LDLCALC 90 04/16/2023   TRIG 277 (H) 04/16/2023   CHOLHDL 4.2 04/16/2023    Lab Results  Component Value Date   HGBA1C 10.0 (A) 07/17/2023      Signature  -   William Schake M.D on 01/19/2024 at 12:09 PM   -  To page go to [email or web address]

## 2024-01-19 NOTE — Progress Notes (Signed)
 [facility] Progress Note   Subjective:   Seen in room. Ongoing thigh/hip pain, but otherwise ok. He prefers to stay on TTS schedule for HD this admit.  Objective Vitals:   01/18/24 1214 01/18/24 1653 01/18/24 2107 01/19/24 0358  BP:  121/63 110/77 (!) 91/58  Pulse: 91 90 82 80  Resp: 17 17 18 17   Temp:  98.6 F (37 C) 98.7 F (37.1 C) 98.1 F (36.7 C)  TempSrc:  Oral Oral Oral  SpO2: 100% 99% 99% 97%  Weight:      Height:       Physical Exam General: Frail man, NAD Heart: RRR Lungs: CTAB Abdomen: soft Extremities: no edema, B BKA Dialysis Access: [facility]  Additional Objective Labs: Basic Metabolic Panel: Recent Labs  Lab 01/15/24 1503 01/17/24 0924 01/19/24 0603  NA 130* 131* 130*  K 5.0 4.3 3.9  CL 91* 94* 90*  CO2 21* 18* 21*  GLUCOSE 102* 124* 159*  BUN 49* 37* 29*  CREATININE 6.42* 5.28* 4.24*  CALCIUM  9.3 9.5 9.6  PHOS 4.9* 3.7 3.7   Liver Function Tests: Recent Labs  Lab 01/15/24 1503 01/17/24 0924 01/19/24 0603  ALBUMIN  2.3* 2.5* 2.3*   CBC: Recent Labs  Lab 01/13/24 0850 01/15/24 1504 01/17/24 0924 01/19/24 0603  WBC 8.3 10.4 7.2 7.6  NEUTROABS  --   --   --  5.5  HGB 9.0* 9.4* 9.4* 9.3*  HCT 28.6* 29.3* 30.1* 29.5*  MCV 100.4* 98.7 101.0* 99.0  PLT 282 290 207 187   Medications:  vancomycin  500 mg (01/17/24 1954)    (feeding supplement) PROSource Plus  30 mL Oral BID BM   acetaminophen   1,000 mg Oral TID   artificial tears   Right Eye QID   bictegravir-emtricitabine -tenofovir  AF  1 tablet Oral Daily   Chlorhexidine  Gluconate Cloth  6 each Topical Q0600   darbepoetin (ARANESP ) injection - DIALYSIS  150 mcg Subcutaneous Q Fri-1800   feeding supplement (NEPRO CARB STEADY)  237 mL Oral BID AC & HS   fentaNYL   1 patch Transdermal Q72H   gabapentin   200 mg Oral TID   gatifloxacin   1 drop Right Eye BID   heparin   5,000 Units Subcutaneous Q8H   insulin  aspart  0-5 Units Subcutaneous QHS   insulin  aspart  0-6 Units  Subcutaneous TID WC   lidocaine   2 patch Transdermal Q24H   methocarbamol   500 mg Oral TID   midodrine   10 mg Oral TID WC   midodrine   10 mg Oral Q M,W,F-HD   multivitamin  1 tablet Oral QHS   pantoprazole   40 mg Oral BID AC   polyethylene glycol  17 g Oral Daily   sevelamer  carbonate  800 mg Oral TID WC    Dialysis Orders [facility] - MWF 3.5h  B400  45.5kg  TDC  Heparin  2000 Mircera 100 mcg - last dose 12/01/23 Calcitriol  1.0 mcg (on hold due to high Ca) Uses midodrine  10mg  TID + extra pre-HD    Assessment/Plan: 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 S/p IR aspiration 8/27 - growing MRSA. Not a surgical candidate. Pain management per primary.   ESRD: Historically MWF schedule - on TTS this admit, insistent to stay on that schedule - next HD tomorrow. BP/volume: Chronic hypotension on mido 10mg  TID and extra pre-HD. Below EDW and will need to be lowered at dc.  Anemia of ESRD: Hgb 9.3 - continue Aranesp  150mcg q  Friday. No IV iron  d/t infection. HIV: controlled on Biktarvy  BMD: CorrCa high, VDRA on hold. Phos stable, on sevelamer  as binder.  Nutrition: Alb low, continue supps and diet has been liberalized. Debility: PT/OT as tolerated. Dispo: Very difficult placement. Noted patient is uninsured thus SNF is not an option.     Izetta Boehringer, PA-C 01/19/2024, 9:09 AM  [employer]

## 2024-01-20 LAB — CBC
HCT: 28.8 % — ABNORMAL LOW (ref 39.0–52.0)
Hemoglobin: 9 g/dL — ABNORMAL LOW (ref 13.0–17.0)
MCH: 31.3 pg (ref 26.0–34.0)
MCHC: 31.3 g/dL (ref 30.0–36.0)
MCV: 100 fL (ref 80.0–100.0)
Platelets: 196 K/uL (ref 150–400)
RBC: 2.88 MIL/uL — ABNORMAL LOW (ref 4.22–5.81)
RDW: 17.3 % — ABNORMAL HIGH (ref 11.5–15.5)
WBC: 7.3 K/uL (ref 4.0–10.5)
nRBC: 0 % (ref 0.0–0.2)

## 2024-01-20 LAB — RENAL FUNCTION PANEL
Albumin: 2.3 g/dL — ABNORMAL LOW (ref 3.5–5.0)
Anion gap: 18 — ABNORMAL HIGH (ref 5–15)
BUN: 40 mg/dL — ABNORMAL HIGH (ref 6–20)
CO2: 20 mmol/L — ABNORMAL LOW (ref 22–32)
Calcium: 9.7 mg/dL (ref 8.9–10.3)
Chloride: 90 mmol/L — ABNORMAL LOW (ref 98–111)
Creatinine, Ser: 5.63 mg/dL — ABNORMAL HIGH (ref 0.61–1.24)
GFR, Estimated: 11 mL/min — ABNORMAL LOW (ref >60)
Glucose, Bld: 145 mg/dL — ABNORMAL HIGH (ref 70–99)
Phosphorus: 4.1 mg/dL (ref 2.5–4.6)
Potassium: 4 mmol/L (ref 3.5–5.1)
Sodium: 128 mmol/L — ABNORMAL LOW (ref 135–145)

## 2024-01-20 LAB — GLUCOSE, CAPILLARY
Glucose-Capillary: 157 mg/dL — ABNORMAL HIGH (ref 70–99)
Glucose-Capillary: 115 mg/dL — ABNORMAL HIGH (ref 70–99)
Glucose-Capillary: 147 mg/dL — ABNORMAL HIGH (ref 70–99)

## 2024-01-20 LAB — VANCOMYCIN, RANDOM: Vancomycin Rm: 20 ug/mL

## 2024-01-20 MED ORDER — ALBUMIN HUMAN 25 % IV SOLN
INTRAVENOUS | Status: AC
  Filled 2024-01-20: qty 100

## 2024-01-20 MED ORDER — MIDODRINE HCL 5 MG PO TABS
ORAL_TABLET | ORAL | Status: AC
  Filled 2024-01-20: qty 2

## 2024-01-20 MED ORDER — VANCOMYCIN HCL 500 MG/100ML IV SOLN
INTRAVENOUS | Status: AC
  Filled 2024-01-20: qty 100

## 2024-01-20 NOTE — Progress Notes (Signed)
 [facility] Progress Note   Subjective:    Laptop interpreter used during this encounter. Seen and examined patient at bedside. C/o ongoing left hip pain. Discussed with bedside RN and pain medication was given a few hours ago. Plan for HD this afternoon.  Objective Vitals:   01/19/24 1618 01/19/24 1930 01/20/24 0503 01/20/24 0849  BP:  96/75 95/64   Pulse: 79 87 86   Resp:  18 19   Temp:  97.9 F (36.6 C) 97.8 F (36.6 C) (P) 97.7 F (36.5 C)  TempSrc: Oral Oral Oral (P) Oral  SpO2:   96%   Weight:      Height:       Physical Exam General: Frail man, NAD Heart: RRR Lungs: CTAB Abdomen: soft Extremities: no edema, B BKA Dialysis Access: [facility]  Filed Weights   01/15/24 1805 01/17/24 1429 01/17/24 1816  Weight: 41 kg 39.6 kg 39.6 kg   No intake or output data in the 24 hours ending 01/20/24 1114  Additional Objective Labs: Basic Metabolic Panel: Recent Labs  Lab 01/15/24 1503 01/17/24 0924 01/19/24 0603  NA 130* 131* 130*  K 5.0 4.3 3.9  CL 91* 94* 90*  CO2 21* 18* 21*  GLUCOSE 102* 124* 159*  BUN 49* 37* 29*  CREATININE 6.42* 5.28* 4.24*  CALCIUM  9.3 9.5 9.6  PHOS 4.9* 3.7 3.7   Liver Function Tests: Recent Labs  Lab 01/15/24 1503 01/17/24 0924 01/19/24 0603  ALBUMIN  2.3* 2.5* 2.3*   No results for input(s): LIPASE, AMYLASE in the last 168 hours. CBC: Recent Labs  Lab 01/15/24 1504 01/17/24 0924 01/19/24 0603  WBC 10.4 7.2 7.6  NEUTROABS  --   --  5.5  HGB 9.4* 9.4* 9.3*  HCT 29.3* 30.1* 29.5*  MCV 98.7 101.0* 99.0  PLT 290 207 187   Blood Culture    Component Value Date/Time   SDES NEEDLE ASPIRATE 01/07/2024 1612   SPECREQUEST DISC 01/07/2024 1612   CULT  01/07/2024 1612    FEW METHICILLIN RESISTANT STAPHYLOCOCCUS AUREUS NO ANAEROBES ISOLATED Performed at [facility], [address] [place], [place] [address]    REPTSTATUS 01/12/2024 FINAL 01/07/2024 1612    Cardiac Enzymes: No results for input(s):  CKTOTAL, CKMB, CKMBINDEX, TROPONINI in the last 168 hours. CBG: Recent Labs  Lab 01/19/24 0751 01/19/24 1204 01/19/24 1615 01/19/24 2145 01/20/24 0851  GLUCAP 148* 183* 119* 164* 157*   Iron  Studies: No results for input(s): IRON , TIBC, TRANSFERRIN, FERRITIN in the last 72 hours. Lab Results  Component Value Date   INR 1.1 01/07/2024   INR 1.1 12/15/2023   INR 1.2 10/08/2023   Studies/Results: No results found.  Medications:  vancomycin  500 mg (01/17/24 1954)    (feeding supplement) PROSource Plus  30 mL Oral BID BM   acetaminophen   1,000 mg Oral TID   artificial tears   Right Eye QID   bictegravir-emtricitabine -tenofovir  AF  1 tablet Oral Daily   Chlorhexidine  Gluconate Cloth  6 each Topical Q0600   darbepoetin (ARANESP ) injection - DIALYSIS  150 mcg Subcutaneous Q Fri-1800   feeding supplement (NEPRO CARB STEADY)  237 mL Oral BID AC & HS   fentaNYL   1 patch Transdermal Q72H   gabapentin   200 mg Oral TID   gatifloxacin   1 drop Right Eye BID   heparin   5,000 Units Subcutaneous Q8H   insulin  aspart  0-5 Units Subcutaneous QHS   insulin  aspart  0-6 Units Subcutaneous TID WC   lidocaine   2 patch Transdermal Q24H   methocarbamol   500 mg Oral TID   midodrine   10 mg Oral TID WC   midodrine   10 mg Oral Q M,W,F-HD   multivitamin  1 tablet Oral QHS   pantoprazole   40 mg Oral BID AC   polyethylene glycol  17 g Oral Daily   sevelamer  carbonate  800 mg Oral TID WC    Dialysis Orders: [facility] - MWF 3.5h  B400  45.5kg  TDC  Heparin  2000 Mircera 100 mcg - last dose 12/01/23 Calcitriol  1.0 mcg (on hold due to high Ca) Uses midodrine  10mg  TID + extra pre-HD   Assessment/Plan: 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 S/p IR aspiration 8/27 - growing MRSA. Not a surgical candidate. Pain management per primary.   ESRD: Historically MWF schedule - on TTS this admit, insistent to stay on that  schedule - next HD this afternoon. BP/volume: Chronic hypotension on mido 10mg  TID and extra pre-HD. Below EDW and will need to be lowered at dc.  Anemia of ESRD: Hgb 9.3 - continue Aranesp  150mcg q Friday. No IV iron  d/t infection. HIV: controlled on Biktarvy  BMD: CorrCa high, VDRA on hold. Phos stable, on sevelamer  as binder.  Nutrition: Alb low, continue supps and diet has been liberalized. Debility: PT/OT as tolerated. Dispo: Very difficult placement. Noted patient is uninsured thus SNF is not an option.  Charmaine Piety, NP  [facility] 01/20/2024,11:14 AM  LOS: 36 days

## 2024-01-20 NOTE — Progress Notes (Signed)
 Pharmacy Antibiotic Note  Alvin Daniels is a 53 y.o. male admitted on 12/14/2023 with osteomyelitis.  Pharmacy has been consulted for vancomycin  dosing. He is ESRD on TTS HD.  Last HD 9/6 on schedule.  Pre HD vanc level was 20 which is acceptable   Plan: Continue Vancomycin  500mg  IV qHD TTS Monitor HD schedule/tolerance, clinical progress Vanc levels weekly Abx end date: 9/29   Height: 4' 11 (149.9 cm) Weight: 39.6 kg (87 lb 4.8 oz) IBW/kg (Calculated) : 47.7  Temp (24hrs), Avg:97.9 F (36.6 C), Min:97.7 F (36.5 C), Max:98 F (36.7 C)  Recent Labs  Lab 01/15/24 1503 01/15/24 1504 01/17/24 0924 01/19/24 0603 01/20/24 0518  WBC  --  10.4 7.2 7.6  --   CREATININE 6.42*  --  5.28* 4.24*  --   VANCORANDOM  --   --   --   --  20     Vanc 8/5 >> (9/29) Cefepime  8/5 >> 8/28 Cipro  8/14 >> 8/15 Biktarvy  (continued from PTA)    8/4 blood: negative 8/27 surgical/needle aspirate: few MRSA 8/27 disc biopsy by IR +MRSA  Suzen Urania Pearlman, Pharm.D., BCPS Clinical Pharmacist Clinical phone for 01/20/2024 from 7:30-3:00 is [phone].  **Pharmacist phone directory can be found on [email or web address] listed under [facility].  01/20/2024 9:48 AM

## 2024-01-20 NOTE — Progress Notes (Signed)
   01/20/24 1603  Vitals  Temp (!) 97.4 F (36.3 C)  Pulse Rate 84  Resp 10  BP 105/67  SpO2 100 %  O2 Device Room Air  Weight 40.7 kg  Type of Weight Post-Dialysis  Oxygen Therapy  Patient Activity (if Appropriate) In bed  Pulse Oximetry Type Continuous  Oximetry Probe Site Changed No  Post Treatment  Dialyzer Clearance Lightly streaked  Hemodialysis Intake (mL) 0 mL  Liters Processed 78  Fluid Removed (mL) 0 mL  Tolerated HD Treatment Yes   Received patient in bed to unit.  Alert and oriented.  Informed consent signed and in chart.   TX duration:3.25hrs  Patient tolerated well.  Transported back to the room  Alert, without acute distress.  Hand-off given to patient's nurse.   Access used: [facility] Access issues: none  Total UF removed: ufg not met due to hypotension Medication(s) given: midodrine , vanc 500mg    [facility]

## 2024-01-20 NOTE — Progress Notes (Signed)
 PROGRESS NOTE        PATIENT DETAILS Name: Alvin Daniels Age: 53 y.o. Sex: male Date of Birth: 1970-07-08 Admit Date: 12/14/2023 Admitting Physician Marsha Ada, MD PCP:Pcp, No  Brief Summary:  Patient is a 53 y.o.  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  Significant events: 8/4>> admit to [facility]  Significant studies: 6/4>> TEE: EF 60-65%, 1.8 x 1.2 mass right atrial wall-?vegetation from old HD catheter impinged on RA wall-versus mass/tumor. 8/4>> MRI pelvis: Discitis/osteomyelitis L4-5-right psoas abscess. 8/5>> CT abdomen/pelvis: Discitis/osteomyelitis L4-5 with paraspinal inflammatory changes/right psoas abscess.  8/8>> MRI LS spine: L4-5 discitis/osteomyelitis with associated paraspinal inflammatory changes, right psoas abscess. 8/20>> MRI left hip: Discitis of the lower lumbar spine with extension into the psoas muscle. 8/20>> BKA-no significant abnormality.   Significant microbiology data: 5/28>> blood culture: MRSA 5/29>> left finger abscess: E. coli/Streptococcus anginosus, Bacteroides fragilis. 8/4>> blood culture: No growth Aspirate culture 8/27>>   Procedures: S/p IR aspiration 8/27 of lumbar spine fluid collection- growing MRSA   Consults: ID Nephrology Ophthalmology Dimensional radiology  Subjective:  Complaining of left hip pain.  Objective: Vitals: Blood pressure (S) (!) 72/55, pulse 93, temperature 98 F (36.7 C), resp. rate 14, height 4' 11 (1.499 m), weight 40.7 kg, SpO2 92%.   Exam:  Awake frail, deconditioned Good air entry Abdomen soft Bilateral BKA  Assessment/Plan:  L4-L5 discitis with surrounding paraspinal inflammatory change and  right psoas abscess Recent history of MRSA bacteremia/left fourth finger osteomyelitis May 2025 (strep/Bacteroides/E. coli on bone culture) Likely MRSA-although cultures this admission negative.   Evaluated by ID-recommendations are for Vancomycin  with HD x 8 weeks - EOT 9/29 Reviewed prior notes-not a surgical candidate-IR unable to drain due to lack of safe window. Repeat MRI on 01/05/2024 noted with progression of disease, ID following they have recommended IR to do a biopsy for guided sensitivity and culture, disc biopsy by IR Dr. Johann on 01/07/2024.  Results from the biopsy so far growing MRSA -   Per ID  Will continue vancomycin  as previously prescribed - EOT 9/29   He has follow-up visit with me scheduled for September 16.   Alvin Daniels  01/11/2024, 1:15 PM   Syncope on 8/20 Probably related to soft blood pressure/orthostatic hypotension Telemetry monitoring unremarkable.  Chronic hypotension On midodrine  Narcotics for back pain very not helping. Minimize narcotics as much as possible-avoid further escalation in narcotic dosing.  ESRD on HD MWF Nephrology following.  Normocytic anemia Secondary to ESRD Hb stable Aranesp /iron  defer to nephrology service Follow  PAD-s/p bilateral BKA Normally uses prosthesis Due to severe pain-he has been unable to ambulate with a prosthesis.  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  Difficult disposition No family-uninsured-SNF not a option per SW-history of ESRD with bilateral BKA-currently unable to ambulate with his prostheses-hence unable to be discharged home safely.   Normally-prior to this  hospitalization was able to  ambulate with prosthesis and catch the bus/scat transport to go back and forth from dialysis center.   DM-2 (A1c 10.0 on 3/6) CBG stable SSI  Recent Labs    01/19/24 1615 01/19/24 2145 01/20/24 0851  GLUCAP 119* 164* 157*        Code status:   Code Status: Full Code   DVT Prophylaxis: heparin  injection 5,000 Units Start: 12/15/23 1400   Family Communication: None at bedside    Disposition Plan: Status is: Inpatient Remains inpatient appropriate because: Severe of illness   Planned Discharge Destination:Home health   Diet: Diet Order             Diet regular Room service appropriate? Yes with Assist; Fluid consistency: Thin; Fluid restriction: 1200 mL Fluid  Diet effective now                   I have personally reviewed following labs and imaging studies  LABORATORY DATA:   Data Review:   Inpatient Medications  Scheduled Meds:  (feeding supplement) PROSource Plus  30 mL Oral BID BM   acetaminophen   1,000 mg Oral TID   artificial tears   Right Eye QID   bictegravir-emtricitabine -tenofovir  AF  1 tablet Oral Daily   Chlorhexidine  Gluconate Cloth  6 each Topical Q0600   darbepoetin (ARANESP ) injection - DIALYSIS  150 mcg Subcutaneous Q Fri-1800   feeding supplement (NEPRO CARB STEADY)  237 mL Oral BID AC & HS   fentaNYL   1 patch Transdermal Q72H   gabapentin   200 mg Oral TID   gatifloxacin   1 drop Right Eye BID   heparin   5,000 Units Subcutaneous Q8H   insulin  aspart  0-5 Units Subcutaneous QHS   insulin  aspart  0-6 Units Subcutaneous TID WC   lidocaine   2 patch Transdermal Q24H   methocarbamol   500 mg Oral TID   midodrine   10 mg Oral TID WC   midodrine   10 mg Oral Q M,W,F-HD   multivitamin  1 tablet Oral QHS   pantoprazole   40 mg Oral BID AC   polyethylene glycol  17 g Oral Daily   sevelamer  carbonate  800 mg Oral TID WC   Continuous Infusions:  vancomycin  500 mg (01/17/24 1954)   PRN Meds:.glucagon  (human  recombinant), hydrALAZINE , ibuprofen , ipratropium-albuterol , metoprolol  tartrate, ondansetron  (ZOFRAN ) IV, oxyCODONE , sodium chloride  flush  DVT Prophylaxis  heparin  injection 5,000 Units Start: 12/15/23 1400     Recent Labs  Lab 01/15/24 1504 01/17/24 0924 01/19/24 0603  WBC 10.4 7.2 7.6  HGB 9.4* 9.4* 9.3*  HCT 29.3* 30.1* 29.5*  PLT 290 207 187  MCV 98.7 101.0* 99.0  MCH 31.6 31.5 31.2  MCHC 32.1 31.2 31.5  RDW 17.1* 17.8* 17.3*  LYMPHSABS  --   --  1.2  MONOABS  --   --  0.7  EOSABS  --   --  0.0  BASOSABS  --   --  0.1    Recent Labs  Lab 01/15/24 1503 01/17/24 0924 01/19/24 0603  NA 130* 131* 130*  K 5.0 4.3 3.9  CL 91* 94* 90*  CO2 21* 18* 21*  ANIONGAP 18* 19* 19*  GLUCOSE 102* 124* 159*  BUN 49* 37* 29*  CREATININE 6.42* 5.28* 4.24*  ALBUMIN  2.3* 2.5* 2.3*  PHOS 4.9* 3.7 3.7  CALCIUM  9.3 9.5 9.6      Recent Labs  Lab 01/15/24 1503 01/17/24 0924 01/19/24 0603  CALCIUM  9.3 9.5 9.6    --------------------------------------------------------------------------------------------------------------- Lab Results  Component Value Date  CHOL 166 04/16/2023   HDL 40 04/16/2023   LDLCALC 90 04/16/2023   TRIG 277 (H) 04/16/2023   CHOLHDL 4.2 04/16/2023    Lab Results  Component Value Date   HGBA1C 10.0 (A) 07/17/2023      Signature  -   Danitra Payano M.D on 01/20/2024 at 2:34 PM   -  To page go to [email or web address]

## 2024-01-20 NOTE — Plan of Care (Signed)
  Problem: Clinical Measurements: °Goal: Ability to maintain clinical measurements within normal limits will improve °Outcome: Progressing °  °Problem: Education: °Goal: Knowledge of General Education information will improve °Description: Including pain rating scale, medication(s)/side effects and non-pharmacologic comfort measures °Outcome: Not Progressing °  °Problem: Health Behavior/Discharge Planning: °Goal: Ability to manage health-related needs will improve °Outcome: Not Progressing °  °

## 2024-01-20 NOTE — Progress Notes (Signed)
 PT Cancellation Note  Patient Details Name: Alvin Daniels MRN: [id number] DOB: 1970/12/06   Cancelled Treatment:    Reason Eval/Treat Not Completed: Patient at procedure or test/unavailable (Pt at HD.)   [name] 01/20/2024, 12:27 PM

## 2024-01-21 LAB — GLUCOSE, CAPILLARY
Glucose-Capillary: 141 mg/dL — ABNORMAL HIGH (ref 70–99)
Glucose-Capillary: 138 mg/dL — ABNORMAL HIGH (ref 70–99)
Glucose-Capillary: 155 mg/dL — ABNORMAL HIGH (ref 70–99)
Glucose-Capillary: 175 mg/dL — ABNORMAL HIGH (ref 70–99)

## 2024-01-21 MED ORDER — FENTANYL CITRATE PF 50 MCG/ML IJ SOSY
12.5000 ug | PREFILLED_SYRINGE | Freq: Four times a day (QID) | INTRAMUSCULAR | Status: DC | PRN
  Administered 2024-01-25 – 2024-01-29 (×2): 12.5 ug via INTRAVENOUS
  Filled 2024-01-21 (×4): qty 1

## 2024-01-21 MED ORDER — CHLORHEXIDINE GLUCONATE CLOTH 2 % EX PADS
6.0000 | MEDICATED_PAD | Freq: Every day | CUTANEOUS | Status: DC
  Administered 2024-01-22 – 2024-01-23 (×2): 6 via TOPICAL

## 2024-01-21 MED ORDER — MIDODRINE HCL 5 MG PO TABS
10.0000 mg | ORAL_TABLET | ORAL | Status: DC
  Administered 2024-01-22 – 2024-02-17 (×8): 10 mg via ORAL
  Filled 2024-01-21 (×8): qty 2

## 2024-01-21 MED ORDER — FENTANYL 25 MCG/HR TD PT72
2.0000 | MEDICATED_PATCH | TRANSDERMAL | Status: AC
  Administered 2024-01-22: 1 via TRANSDERMAL
  Filled 2024-01-21: qty 1

## 2024-01-21 NOTE — Progress Notes (Signed)
   01/21/24 1528  OT Visit Information  Last OT Received On 01/21/24  Assistance Needed +1  History of Present Illness Patient is a 53 y/o male admitted 12/14/23 with hip pain MRI showing R psoas abscess and L4-L5 discitis and osteomyelitis. 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  PMH includes ESRD on HD MWF, hepC, MRSA, HIV, bilat BKA, DMII.  OT Time Calculation  OT Start Time (ACUTE ONLY) 1353  OT Stop Time (ACUTE ONLY) 1412  OT Time Calculation (min) 19 min  OT General Charges  $OT Visit 1 Visit   OT attempted to engage pt in therapy, he refused mobility and reports too much pain in R hip. He notes that it is usually more pain the day after HD and the pain meds are not helpful. Pt typically laying in L sidelying position and reports being unable to reposition independently due to the pain. Educated pt on the benefits of pressure relief to reduce further risk of worsening any wounds and preventing new wounds from forming. Attached gait belt to bed rail for put to use to pull himself when able, also educated on using it to reposition his BLEs. He continued to decline any mobility efforts and did not want to perform theraband exercises. Updated pt goals, being as he lives alone he does need to pursue being at a mod I level.   01/21/2024  AB, OTR/L  Acute Rehabilitation Services  Office: [phone]

## 2024-01-21 NOTE — Progress Notes (Signed)
 Physical Therapy Treatment Patient Details Name: Alvin Daniels MRN: [id number] DOB: [birth year] Today's Date: 01/21/2024   History of Present Illness Patient is a 53 y/o male admitted 12/14/23 with hip pain MRI showing R psoas abscess and L4-L5 discitis and osteomyelitis. 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  PMH includes ESRD on HD MWF, hepC, MRSA, HIV, bilat BKA, DMII.    PT Comments  Pt once again limited by L hip pain. Co-treat with OT. Pt again declining all mobility due to pain however extensive education was provided to patient on importance of position changes, pressure relief for wound prevention, and use of gait belt for assistance LE with movement. No change in DC/DME recs at this time. PT will continue to follow.     If plan is discharge home, recommend the following: A little help with walking and/or transfers;Help with stairs or ramp for entrance;Assist for transportation;A little help with bathing/dressing/bathroom;Assistance with cooking/housework   Can travel by private vehicle        Equipment Recommendations  BSC/3in1    Recommendations for Other Services Other (comment)     Precautions / Restrictions Precautions Precautions: Fall Recall of Precautions/Restrictions: Impaired Precaution/Restrictions Comments: B BKA; prosthetics in the room Restrictions Weight Bearing Restrictions Per Provider Order: No     Mobility  Bed Mobility Overal bed mobility: Modified Independent             General bed mobility comments: Pt moving slightly in bed on his own however declines to do anything else as he is in too much pain.    Transfers                   General transfer comment: Pt repeatly declined due to pain.    Ambulation/Gait               General Gait Details: pt declined attempts despite encouragement and cues due to L leg pain   Stairs              Wheelchair Mobility     Tilt Bed    Modified Rankin (Stroke Patients Only)       Balance                                            Communication Communication Communication: No apparent difficulties;Other (comment) Factors Affecting Communication: Non - English speaking, interpreter not available  Cognition Arousal: Alert Behavior During Therapy: WFL for tasks assessed/performed   PT - Cognitive impairments: Safety/Judgement, Memory                         Following commands: Intact      Cueing Cueing Techniques: Verbal cues, Visual cues, Tactile cues  Exercises      General Comments General comments (skin integrity, edema, etc.): Extensive education given to patient on pressure relief and importance of changing positions.      Pertinent Vitals/Pain Pain Assessment Pain Assessment: 0-10 Pain Score: 10-Worst pain ever Pain Location: L hip and leg Pain Descriptors / Indicators: Discomfort, Sore, Grimacing Pain Intervention(s): Limited activity within patient's tolerance, Monitored during session, Premedicated before session    Home Living                          Prior Function  PT Goals (current goals can now be found in the care plan section) Progress towards PT goals: Not progressing toward goals - comment    Frequency    Min 2X/week      PT Plan      Co-evaluation              AM-PAC PT 6 Clicks Mobility   Outcome Measure  Help needed turning from your back to your side while in a flat bed without using bedrails?: None Help needed moving from lying on your back to sitting on the side of a flat bed without using bedrails?: None Help needed moving to and from a bed to a chair (including a wheelchair)?: A Little Help needed standing up from a chair using your arms (e.g., wheelchair or bedside chair)?: A Little Help needed to walk in hospital room?: Total Help needed climbing 3-5  steps with a railing? : Total 6 Click Score: 16    End of Session Equipment Utilized During Treatment: Gait belt (provided to patient in order to use adjusting LLE.) Activity Tolerance: Patient limited by pain Patient left: in bed;with call bell/phone within reach;with bed alarm set;Other (comment) Nurse Communication: Mobility status;Patient requests pain meds PT Visit Diagnosis: Pain;Other abnormalities of gait and mobility (R26.89);Unsteadiness on feet (R26.81);Muscle weakness (generalized) (M62.81);Difficulty in walking, not elsewhere classified (R26.2) Pain - Right/Left: Left Pain - part of body: Hip;Leg     Time: 8645-8587 PT Time Calculation (min) (ACUTE ONLY): 18 min  Charges:    $Therapeutic Activity: 8-22 mins PT General Charges $$ ACUTE PT VISIT: 1 Visit                     Autry Droege B, PT, DPT Acute Rehab Services [phone]    Marcell Chavarin 01/21/2024, 4:25 PM

## 2024-01-21 NOTE — Progress Notes (Signed)
 PROGRESS NOTE        PATIENT DETAILS Name: Alvin Daniels Age: 53 y.o. Sex: male Date of Birth: 09/01/1970 Admit Date: 12/14/2023 Admitting Physician Marsha Ada, MD PCP:Pcp, No  Brief Summary:  Patient is a 53 y.o.  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  Significant events: 8/4>> admit to [facility]  Significant studies: 6/4>> TEE: EF 60-65%, 1.8 x 1.2 mass right atrial wall-?vegetation from old HD catheter impinged on RA wall-versus mass/tumor. 8/4>> MRI pelvis: Discitis/osteomyelitis L4-5-right psoas abscess. 8/5>> CT abdomen/pelvis: Discitis/osteomyelitis L4-5 with paraspinal inflammatory changes/right psoas abscess.  8/8>> MRI LS spine: L4-5 discitis/osteomyelitis with associated paraspinal inflammatory changes, right psoas abscess. 8/20>> MRI left hip: Discitis of the lower lumbar spine with extension into the psoas muscle. 8/20>> MRI left knee: BKA-no significant abnormality. 8/25>> MRI LS spine: Persistent/ongoing changes of osteomyelitis at L4/L5 with associated epidural phlegmon/abscess within the ventral epidural space at L4/L5.  Associated multiloculated bilateral psoas abscess measuring up to 3.1 cm on the left.  Significant microbiology data: 5/28>> blood culture: MRSA 5/29>> left finger abscess: E. coli/Streptococcus anginosus, Bacteroides fragilis. 8/4>> blood culture: No growth 8/27 >> L4-L5 disc aspirate culture: MRSA.    Procedures: 8/27 >> IR aspiration 8/27 of of L4-L5 disc aspirate  Consults: ID Nephrology Ophthalmology Dimensional radiology  Subjective: Continues to have left-sided hip pain-unchanged for the past several weeks.  Objective: Vitals: Blood  pressure 105/66, pulse 85, temperature 98 F (36.7 C), temperature source Oral, resp. rate 18, height 4' 11 (1.499 m), weight 40.7 kg, SpO2 100%.   Exam: Awake/alert Frail/deconditioned Abdomen: Soft nontender Extremities: S/p bilateral BKA's.  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 coli on bone culture) Continues to have left-sided hip pain-unchanged for the past several weeks On IV vancomycin -EOT 9/29 Per prior notes-not a surgical candidate-IR unable to drain abscess due to lack of safe window Will need reimaging at some point-perhaps closer to antibiotic EOT.  Syncope on 8/20 Probably related to soft blood pressure/orthostatic hypotension Telemetry monitoring unremarkable.  Chronic hypotension On midodrine  Narcotics for back pain very not helping. Minimize narcotics as much as possible-avoid further escalation in narcotic dosing.  ESRD on HD MWF Nephrology following.  Normocytic anemia Secondary to ESRD Hb stable Aranesp /iron  defer to nephrology service Follow  PAD-s/p bilateral BKA Normally uses prosthesis Due to severe pain-he has been unable to ambulate with a prosthesis.  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  Difficult disposition No family-uninsured-SNF not a option per SW-history of ESRD with bilateral BKA-currently unable to ambulate with his prostheses-hence unable to be discharged home safely.   Normally-prior to this hospitalization was able to ambulate with  prosthesis and catch the bus/scat transport to go back and forth from dialysis center.   DM-2 (A1c 10.0 on 3/6) CBG stable SSI  Recent Labs  01/20/24 2126 01/21/24 0811 01/21/24 1219  GLUCAP 147* 141* 138*        Code status:   Code Status: Full Code   DVT Prophylaxis: heparin  injection 5,000 Units Start: 12/15/23 1400   Family Communication: None at bedside    Disposition Plan: Status is: Inpatient Remains inpatient appropriate because: Severe of illness   Planned Discharge Destination:Home health   Diet: Diet Order             Diet regular Room service appropriate? Yes with Assist; Fluid consistency: Thin; Fluid restriction: 1200 mL Fluid  Diet effective now                   I have personally reviewed following labs and imaging studies  LABORATORY DATA:   Data Review:   Inpatient Medications  Scheduled Meds:  (feeding supplement) PROSource Plus  30 mL Oral BID BM   acetaminophen   1,000 mg Oral TID   artificial tears   Right Eye QID   bictegravir-emtricitabine -tenofovir  AF  1 tablet Oral Daily   [START ON 01/22/2024] Chlorhexidine  Gluconate Cloth  6 each Topical Q0600   darbepoetin (ARANESP ) injection - DIALYSIS  150 mcg Subcutaneous Q Fri-1800   feeding supplement (NEPRO CARB STEADY)  237 mL Oral BID AC & HS   fentaNYL   1 patch Transdermal Q72H   gabapentin   200 mg Oral TID   gatifloxacin   1 drop Right Eye BID   heparin   5,000 Units Subcutaneous Q8H   insulin  aspart  0-5 Units Subcutaneous QHS   insulin  aspart  0-6 Units Subcutaneous TID WC   lidocaine   2 patch Transdermal Q24H   methocarbamol   500 mg Oral TID   midodrine   10 mg Oral TID WC   [START ON 01/22/2024] midodrine   10 mg Oral Q T,Th,Sa-HD   multivitamin  1 tablet Oral QHS   pantoprazole   40 mg Oral BID AC   polyethylene glycol  17 g Oral Daily   sevelamer  carbonate  800 mg Oral TID WC   Continuous Infusions:  vancomycin  Stopped (01/20/24 1700)   PRN Meds:.glucagon  (human  recombinant), hydrALAZINE , ibuprofen , ipratropium-albuterol , metoprolol  tartrate, ondansetron  (ZOFRAN ) IV, oxyCODONE , sodium chloride  flush  DVT Prophylaxis  heparin  injection 5,000 Units Start: 12/15/23 1400     Recent Labs  Lab 01/15/24 1504 01/17/24 0924 01/19/24 0603 01/20/24 1252  WBC 10.4 7.2 7.6 7.3  HGB 9.4* 9.4* 9.3* 9.0*  HCT 29.3* 30.1* 29.5* 28.8*  PLT 290 207 187 196  MCV 98.7 101.0* 99.0 100.0  MCH 31.6 31.5 31.2 31.3  MCHC 32.1 31.2 31.5 31.3  RDW 17.1* 17.8* 17.3* 17.3*  LYMPHSABS  --   --  1.2  --   MONOABS  --   --  0.7  --   EOSABS  --   --  0.0  --   BASOSABS  --   --  0.1  --     Recent Labs  Lab 01/15/24 1503 01/17/24 0924 01/19/24 0603 01/20/24 1252  NA 130* 131* 130* 128*  K 5.0 4.3 3.9 4.0  CL 91* 94* 90* 90*  CO2 21* 18* 21* 20*  ANIONGAP 18* 19* 19* 18*  GLUCOSE 102* 124* 159* 145*  BUN 49* 37* 29* 40*  CREATININE 6.42* 5.28* 4.24* 5.63*  ALBUMIN  2.3* 2.5* 2.3* 2.3*  PHOS 4.9* 3.7 3.7 4.1  CALCIUM  9.3 9.5 9.6 9.7      Recent Labs  Lab 01/15/24 1503 01/17/24 0924 01/19/24 0603 01/20/24 1252  CALCIUM  9.3 9.5 9.6  9.7    --------------------------------------------------------------------------------------------------------------- Lab Results  Component Value Date   CHOL 166 04/16/2023   HDL 40 04/16/2023   LDLCALC 90 04/16/2023   TRIG 277 (H) 04/16/2023   CHOLHDL 4.2 04/16/2023    Lab Results  Component Value Date   HGBA1C 10.0 (A) 07/17/2023      Signature  -   Donalda Applebaum M.D on 01/21/2024 at 1:37 PM   -  To page go to [email or web address]

## 2024-01-21 NOTE — Plan of Care (Signed)

## 2024-01-21 NOTE — Progress Notes (Signed)
 [facility] Progress Note   Subjective:    Seen and examined patient at bedside. Tolerated yesterday's HD with no UF. Remains on RA. Denies SOB but c/o left hip pain. Next HD 9/11.  Objective Vitals:   01/21/24 0015 01/21/24 0356 01/21/24 0756 01/21/24 1217  BP: 93/66 (!) 97/56 96/68 105/66  Pulse: 85 85    Resp: 18 18    Temp: 98.5 F (36.9 C) 97.9 F (36.6 C) 98 F (36.7 C) 98 F (36.7 C)  TempSrc: Oral Oral Oral Oral  SpO2: 100% 100%    Weight:      Height:       Physical Exam General: Frail man, NAD Heart: RRR Lungs: CTAB Abdomen: soft Extremities: no edema, B BKA Dialysis Access: [facility]  Filed Weights   01/17/24 1816 01/20/24 1230 01/20/24 1603  Weight: 39.6 kg 40.7 kg 40.7 kg    Intake/Output Summary (Last 24 hours) at 01/21/2024 1305 Last data filed at 01/21/2024 0149 Gross per 24 hour  Intake 240 ml  Output 0 ml  Net 240 ml    Additional Objective Labs: Basic Metabolic Panel: Recent Labs  Lab 01/17/24 0924 01/19/24 0603 01/20/24 1252  NA 131* 130* 128*  K 4.3 3.9 4.0  CL 94* 90* 90*  CO2 18* 21* 20*  GLUCOSE 124* 159* 145*  BUN 37* 29* 40*  CREATININE 5.28* 4.24* 5.63*  CALCIUM  9.5 9.6 9.7  PHOS 3.7 3.7 4.1   Liver Function Tests: Recent Labs  Lab 01/17/24 0924 01/19/24 0603 01/20/24 1252  ALBUMIN  2.5* 2.3* 2.3*   No results for input(s): LIPASE, AMYLASE in the last 168 hours. CBC: Recent Labs  Lab 01/15/24 1504 01/17/24 0924 01/19/24 0603 01/20/24 1252  WBC 10.4 7.2 7.6 7.3  NEUTROABS  --   --  5.5  --   HGB 9.4* 9.4* 9.3* 9.0*  HCT 29.3* 30.1* 29.5* 28.8*  MCV 98.7 101.0* 99.0 100.0  PLT 290 207 187 196   Blood Culture    Component Value Date/Time   SDES NEEDLE ASPIRATE 01/07/2024 1612   SPECREQUEST DISC 01/07/2024 1612   CULT  01/07/2024 1612    FEW METHICILLIN RESISTANT STAPHYLOCOCCUS AUREUS NO ANAEROBES ISOLATED Performed at [facility], [address] [place], [place] [address]     REPTSTATUS 01/12/2024 FINAL 01/07/2024 1612    Cardiac Enzymes: No results for input(s): CKTOTAL, CKMB, CKMBINDEX, TROPONINI in the last 168 hours. CBG: Recent Labs  Lab 01/20/24 0851 01/20/24 1717 01/20/24 2126 01/21/24 0811 01/21/24 1219  GLUCAP 157* 115* 147* 141* 138*   Iron  Studies: No results for input(s): IRON , TIBC, TRANSFERRIN, FERRITIN in the last 72 hours. Lab Results  Component Value Date   INR 1.1 01/07/2024   INR 1.1 12/15/2023   INR 1.2 10/08/2023   Studies/Results: No results found.  Medications:  vancomycin  Stopped (01/20/24 1700)    (feeding supplement) PROSource Plus  30 mL Oral BID BM   acetaminophen   1,000 mg Oral TID   artificial tears   Right Eye QID   bictegravir-emtricitabine -tenofovir  AF  1 tablet Oral Daily   Chlorhexidine  Gluconate Cloth  6 each Topical Q0600   darbepoetin (ARANESP ) injection - DIALYSIS  150 mcg Subcutaneous Q Fri-1800   feeding supplement (NEPRO CARB STEADY)  237 mL Oral BID AC & HS   fentaNYL   1 patch Transdermal Q72H   gabapentin   200 mg Oral TID   gatifloxacin   1 drop Right Eye BID   heparin   5,000 Units Subcutaneous Q8H  insulin  aspart  0-5 Units Subcutaneous QHS   insulin  aspart  0-6 Units Subcutaneous TID WC   lidocaine   2 patch Transdermal Q24H   methocarbamol   500 mg Oral TID   midodrine   10 mg Oral TID WC   [START ON 01/22/2024] midodrine   10 mg Oral Q T,Th,Sa-HD   multivitamin  1 tablet Oral QHS   pantoprazole   40 mg Oral BID AC   polyethylene glycol  17 g Oral Daily   sevelamer  carbonate  800 mg Oral TID WC    Dialysis Orders: [facility] - MWF 3.5h  B400  45.5kg  TDC  Heparin  2000 Mircera 100 mcg - last dose 12/01/23 Calcitriol  1.0 mcg (on hold due to high Ca) Uses midodrine  10mg  TID + extra pre-HD   Assessment/Plan: 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 S/p IR aspiration 8/27 - growing MRSA. Not a surgical  candidate. Pain management per primary.   ESRD: Historically MWF schedule - on TTS this admit, insistent to stay on that schedule - next HD 9/11. BP/volume: Chronic hypotension on mido 10mg  TID and extra pre-HD. Below EDW and will need to be lowered at dc.  Anemia of ESRD: Hgb 9.3 - continue Aranesp  150mcg q Friday. No IV iron  d/t infection. HIV: controlled on Biktarvy  BMD: CorrCa high, VDRA on hold. Phos stable, on sevelamer  as binder.  Nutrition: Alb low, continue supps and diet has been liberalized. Debility: PT/OT as tolerated. Dispo: Very difficult placement. Noted patient is uninsured thus SNF is not an option.  Charmaine Piety, NP [facility] 01/21/2024,1:05 PM  LOS: 37 days

## 2024-01-21 NOTE — Progress Notes (Signed)
   01/21/24 1432  Spiritual Encounters  Type of Visit Initial  Care provided to: Patient  Reason for visit Routine spiritual support  OnCall Visit No  Spiritual Framework  Presenting Themes Impactful experiences and emotions  Community/Connection None  Patient Stress Factors Health changes  Family Stress Factors None identified  Interventions  Spiritual Care Interventions Made Compassionate presence;Established relationship of care and support   Chaplain stopped by to check on PT at bedside. Pt was resting and does not speak [place]. Chaplain utilized a Nurse, learning disability to attempt communication. At this time Pt did not indicate any spiritual or emotional needs  Chaplain reminded the translator that chaplain services remain available should the Pt express needs in the future.

## 2024-01-22 LAB — CBC WITH DIFFERENTIAL/PLATELET
Abs Immature Granulocytes: 0.02 K/uL (ref 0.00–0.07)
Basophils Absolute: 0.1 K/uL (ref 0.0–0.1)
Basophils Relative: 1 %
Eosinophils Absolute: 0.1 K/uL (ref 0.0–0.5)
Eosinophils Relative: 1 %
HCT: 28.8 % — ABNORMAL LOW (ref 39.0–52.0)
Hemoglobin: 9.4 g/dL — ABNORMAL LOW (ref 13.0–17.0)
Immature Granulocytes: 0 %
Lymphocytes Relative: 17 %
Lymphs Abs: 1.1 K/uL (ref 0.7–4.0)
MCH: 31.9 pg (ref 26.0–34.0)
MCHC: 32.6 g/dL (ref 30.0–36.0)
MCV: 97.6 fL (ref 80.0–100.0)
Monocytes Absolute: 0.6 K/uL (ref 0.1–1.0)
Monocytes Relative: 9 %
Neutro Abs: 4.8 K/uL (ref 1.7–7.7)
Neutrophils Relative %: 72 %
Platelets: 185 K/uL (ref 150–400)
RBC: 2.95 MIL/uL — ABNORMAL LOW (ref 4.22–5.81)
RDW: 17.6 % — ABNORMAL HIGH (ref 11.5–15.5)
WBC: 6.6 K/uL (ref 4.0–10.5)
nRBC: 0 % (ref 0.0–0.2)

## 2024-01-22 LAB — RENAL FUNCTION PANEL
Albumin: 2.3 g/dL — ABNORMAL LOW (ref 3.5–5.0)
Anion gap: 18 — ABNORMAL HIGH (ref 5–15)
BUN: 25 mg/dL — ABNORMAL HIGH (ref 6–20)
CO2: 20 mmol/L — ABNORMAL LOW (ref 22–32)
Calcium: 9.6 mg/dL (ref 8.9–10.3)
Chloride: 94 mmol/L — ABNORMAL LOW (ref 98–111)
Creatinine, Ser: 4.06 mg/dL — ABNORMAL HIGH (ref 0.61–1.24)
GFR, Estimated: 17 mL/min — ABNORMAL LOW (ref >60)
Glucose, Bld: 164 mg/dL — ABNORMAL HIGH (ref 70–99)
Phosphorus: 3.4 mg/dL (ref 2.5–4.6)
Potassium: 4.1 mmol/L (ref 3.5–5.1)
Sodium: 132 mmol/L — ABNORMAL LOW (ref 135–145)

## 2024-01-22 LAB — GLUCOSE, CAPILLARY
Glucose-Capillary: 152 mg/dL — ABNORMAL HIGH (ref 70–99)
Glucose-Capillary: 104 mg/dL — ABNORMAL HIGH (ref 70–99)
Glucose-Capillary: 107 mg/dL — ABNORMAL HIGH (ref 70–99)
Glucose-Capillary: 174 mg/dL — ABNORMAL HIGH (ref 70–99)

## 2024-01-22 MED ORDER — MIDODRINE HCL 5 MG PO TABS
ORAL_TABLET | ORAL | Status: AC
  Filled 2024-01-22: qty 2

## 2024-01-22 MED ORDER — ALTEPLASE 2 MG IJ SOLR
2.0000 mg | Freq: Once | INTRAMUSCULAR | Status: DC | PRN
  Filled 2024-01-22: qty 2

## 2024-01-22 MED ORDER — OXYCODONE HCL 5 MG PO TABS
ORAL_TABLET | ORAL | Status: AC
  Filled 2024-01-22: qty 2

## 2024-01-22 MED ORDER — VANCOMYCIN HCL 500 MG/100ML IV SOLN
INTRAVENOUS | Status: AC
Start: 2024-01-22 — End: 2024-01-22
  Filled 2024-01-22: qty 100

## 2024-01-22 MED ORDER — HEPARIN SODIUM (PORCINE) 1000 UNIT/ML IJ SOLN
INTRAMUSCULAR | Status: AC
  Filled 2024-01-22: qty 4

## 2024-01-22 MED ORDER — PENTAFLUOROPROP-TETRAFLUOROETH EX AERO: 1.0000 | INHALATION_SPRAY | CUTANEOUS | Status: DC | PRN

## 2024-01-22 MED ORDER — HEPARIN SODIUM (PORCINE) 1000 UNIT/ML DIALYSIS
1000.0000 [IU] | INTRAMUSCULAR | Status: DC | PRN
  Filled 2024-01-22: qty 1

## 2024-01-22 MED ORDER — LIDOCAINE-PRILOCAINE 2.5-2.5 % EX CREA
1.0000 | TOPICAL_CREAM | CUTANEOUS | Status: DC | PRN
  Filled 2024-01-22: qty 5

## 2024-01-22 NOTE — Progress Notes (Signed)
 OT Cancellation Note  Patient Details Name: Alvin Daniels MRN: [id number] DOB: 03-03-1971   Cancelled Treatment:    Reason Eval/Treat Not Completed: Patient at procedure or test/ unavailable (Pt at HD, OT will follow-up with pt as able.)  01/22/2024  AB, OTR/L  Acute Rehabilitation Services  Office: [phone]   [name] 01/22/2024, 10:26 AM

## 2024-01-22 NOTE — Plan of Care (Signed)

## 2024-01-22 NOTE — Progress Notes (Signed)
 [facility] Progress Note   Subjective:    Seen and examined patient on HD. SBPs now low in 70s but patient remains asymptomatic. On Midodrine . Informed by HD RN of patient getting pain medication prior. Minimal UF ordered but will run even. He is not overloaded.  Objective Vitals:   01/22/24 1030 01/22/24 1100 01/22/24 1119 01/22/24 1130  BP: (!) 80/57 (!) 81/58 (!) 87/56 93/63  Pulse: 84 82 85 84  Resp: 12 14 11 17   Temp:      TempSrc:      SpO2: 100% 100% 100% 99%  Weight:      Height:       Physical Exam General: Frail man, NAD Heart: RRR Lungs: CTAB Abdomen: soft Extremities: no edema, B BKA Dialysis Access: [facility]  Filed Weights   01/17/24 1816 01/20/24 1230 01/20/24 1603  Weight: 39.6 kg 40.7 kg 40.7 kg   No intake or output data in the 24 hours ending 01/22/24 1139  Additional Objective Labs: Basic Metabolic Panel: Recent Labs  Lab 01/19/24 0603 01/20/24 1252 01/22/24 0324  NA 130* 128* 132*  K 3.9 4.0 4.1  CL 90* 90* 94*  CO2 21* 20* 20*  GLUCOSE 159* 145* 164*  BUN 29* 40* 25*  CREATININE 4.24* 5.63* 4.06*  CALCIUM  9.6 9.7 9.6  PHOS 3.7 4.1 3.4   Liver Function Tests: Recent Labs  Lab 01/19/24 0603 01/20/24 1252 01/22/24 0324  ALBUMIN  2.3* 2.3* 2.3*   No results for input(s): LIPASE, AMYLASE in the last 168 hours. CBC: Recent Labs  Lab 01/15/24 1504 01/17/24 0924 01/19/24 0603 01/20/24 1252 01/22/24 0324  WBC 10.4 7.2 7.6 7.3 6.6  NEUTROABS  --   --  5.5  --  4.8  HGB 9.4* 9.4* 9.3* 9.0* 9.4*  HCT 29.3* 30.1* 29.5* 28.8* 28.8*  MCV 98.7 101.0* 99.0 100.0 97.6  PLT 290 207 187 196 185   Blood Culture    Component Value Date/Time   SDES NEEDLE ASPIRATE 01/07/2024 1612   SPECREQUEST DISC 01/07/2024 1612   CULT  01/07/2024 1612    FEW METHICILLIN RESISTANT STAPHYLOCOCCUS AUREUS NO ANAEROBES ISOLATED Performed at [facility], [address], [place], [place] [address]    REPTSTATUS 01/12/2024 FINAL  01/07/2024 1612    Cardiac Enzymes: No results for input(s): CKTOTAL, CKMB, CKMBINDEX, TROPONINI in the last 168 hours. CBG: Recent Labs  Lab 01/21/24 0811 01/21/24 1219 01/21/24 1808 01/21/24 2101 01/22/24 0822  GLUCAP 141* 138* 155* 175* 152*   Iron  Studies: No results for input(s): IRON , TIBC, TRANSFERRIN, FERRITIN in the last 72 hours. Lab Results  Component Value Date   INR 1.1 01/07/2024   INR 1.1 12/15/2023   INR 1.2 10/08/2023   Studies/Results: No results found.  Medications:  vancomycin  Stopped (01/20/24 1700)    (feeding supplement) PROSource Plus  30 mL Oral BID BM   acetaminophen   1,000 mg Oral TID   artificial tears   Right Eye QID   bictegravir-emtricitabine -tenofovir  AF  1 tablet Oral Daily   Chlorhexidine  Gluconate Cloth  6 each Topical Q0600   darbepoetin (ARANESP ) injection - DIALYSIS  150 mcg Subcutaneous Q Fri-1800   feeding supplement (NEPRO CARB STEADY)  237 mL Oral BID AC & HS   fentaNYL   2 patch Transdermal Q72H   gabapentin   200 mg Oral TID   gatifloxacin   1 drop Right Eye BID   heparin   5,000 Units Subcutaneous Q8H   insulin  aspart  0-5 Units Subcutaneous QHS  insulin  aspart  0-6 Units Subcutaneous TID WC   lidocaine   2 patch Transdermal Q24H   methocarbamol   500 mg Oral TID   midodrine   10 mg Oral TID WC   midodrine   10 mg Oral Q T,Th,Sa-HD   multivitamin  1 tablet Oral QHS   pantoprazole   40 mg Oral BID AC   polyethylene glycol  17 g Oral Daily   sevelamer  carbonate  800 mg Oral TID WC    Dialysis Orders: [facility] - MWF 3.5h  B400  45.5kg  TDC  Heparin  2000 Mircera 100 mcg - last dose 12/01/23 Calcitriol  1.0 mcg (on hold due to high Ca) Uses midodrine  10mg  TID + extra pre-HD    Assessment/Plan: 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 S/p IR aspiration 8/27 - growing MRSA. Not a surgical candidate. Pain management per primary.   ESRD:  Historically MWF schedule - on TTS this admit, insistent to stay on that schedule - On HD. BP/volume: Chronic hypotension on mido 10mg  TID and extra pre-HD. Below EDW and will need to be lowered at dc.  Anemia of ESRD: Hgb 9.3 - continue Aranesp  150mcg q Friday. No IV iron  d/t infection. HIV: controlled on Biktarvy  BMD: CorrCa high, VDRA on hold. Phos stable, on sevelamer  as binder.  Nutrition: Alb low, continue supps and diet has been liberalized. Debility: PT/OT as tolerated. Dispo: Very difficult placement. Noted patient is uninsured thus SNF is not an option.  Charmaine Piety, NP [facility] 01/22/2024,11:39 AM  LOS: 38 days

## 2024-01-22 NOTE — Plan of Care (Signed)
 No fluid taken off today per HD nurse.

## 2024-01-22 NOTE — Progress Notes (Signed)
 PROGRESS NOTE        PATIENT DETAILS Name: Alvin Daniels Age: 53 y.o. Sex: male Date of Birth: Sep 07, 1970 Admit Date: 12/14/2023 Admitting Physician Marsha Ada, MD PCP:Pcp, No  Brief Summary:  Patient is a 53 y.o.  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  Significant events: 8/4>> admit to [facility]  Significant studies: 6/4>> TEE: EF 60-65%, 1.8 x 1.2 mass right atrial wall-?vegetation from old HD catheter impinged on RA wall-versus mass/tumor. 8/4>> MRI pelvis: Discitis/osteomyelitis L4-5-right psoas abscess. 8/5>> CT abdomen/pelvis: Discitis/osteomyelitis L4-5 with paraspinal inflammatory changes/right psoas abscess.  8/8>> MRI LS spine: L4-5 discitis/osteomyelitis with associated paraspinal inflammatory changes, right psoas abscess. 8/20>> MRI left hip: Discitis of the lower lumbar spine with extension into the psoas muscle. 8/20>> MRI left knee: BKA-no significant abnormality. 8/25>> MRI LS spine: Persistent/ongoing changes of osteomyelitis at L4/L5 with associated epidural phlegmon/abscess within the ventral epidural space at L4/L5.  Associated multiloculated bilateral psoas abscess measuring up to 3.1 cm on the left.  Significant microbiology data: 5/28>> blood culture: MRSA 5/29>> left finger abscess: E. coli/Streptococcus anginosus, Bacteroides fragilis. 8/4>> blood culture: No growth 8/27 >> L4-L5 disc aspirate culture: MRSA.    Procedures: 8/27 >> IR aspiration 8/27 of of L4-L5 disc aspirate  Consults: ID Nephrology Ophthalmology Dimensional radiology  Subjective: Appears more comfortable today-lying comfortably in bed-continues to have pain in his left hip area over  the past several weeks.  Objective: Vitals: Blood pressure 110/67, pulse 88, temperature (!) 97.5 F (36.4 C), temperature source Oral, resp. rate 10, height 4' 11 (1.499 m), weight 40.7 kg, SpO2 100%.   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 coli on bone culture) Continues to have left-sided hip pain-unchanged for the past several weeks-narcotic regimen adjusted 9/10. On IV vancomycin -EOT 9/29 Per prior notes-not a surgical candidate-IR unable to drain abscess due to lack of safe window Will need reimaging at some point-perhaps closer to antibiotic EOT or sooner if pain worsens..  Syncope on 8/20 Probably related to soft blood pressure/orthostatic hypotension Telemetry monitoring unremarkable.  Chronic hypotension On midodrine  Narcotics for back pain very not helping. Minimize narcotics as much as possible-avoid further escalation in narcotic dosing.  ESRD on HD MWF Nephrology following.  Normocytic anemia Secondary to ESRD Hb stable Aranesp /iron  defer to nephrology service Follow  PAD-s/p bilateral BKA Normally uses prosthesis Due to severe pain-he has been unable to ambulate with a prosthesis.  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  Difficult disposition No family-uninsured-SNF not a option per SW-history of ESRD with bilateral BKA-currently unable to ambulate with his  prostheses-hence unable to be discharged home safely.   Normally-prior to this hospitalization was able to ambulate with prosthesis and catch the bus/scat transport to go back  and forth from dialysis center.   DM-2 (A1c 10.0 on 3/6) CBG stable SSI  Recent Labs    01/21/24 1808 01/21/24 2101 01/22/24 0822  GLUCAP 155* 175* 152*        Code status:   Code Status: Full Code   DVT Prophylaxis: heparin  injection 5,000 Units Start: 12/15/23 1400   Family Communication: None at bedside    Disposition Plan: Status is: Inpatient Remains inpatient appropriate because: Severe of illness   Planned Discharge Destination:Home health   Diet: Diet Order             Diet regular Room service appropriate? Yes with Assist; Fluid consistency: Thin; Fluid restriction: 1200 mL Fluid  Diet effective now                   I have personally reviewed following labs and imaging studies  LABORATORY DATA:   Data Review:   Inpatient Medications  Scheduled Meds:  (feeding supplement) PROSource Plus  30 mL Oral BID BM   acetaminophen   1,000 mg Oral TID   artificial tears   Right Eye QID   bictegravir-emtricitabine -tenofovir  AF  1 tablet Oral Daily   Chlorhexidine  Gluconate Cloth  6 each Topical Q0600   darbepoetin (ARANESP ) injection - DIALYSIS  150 mcg Subcutaneous Q Fri-1800   feeding supplement (NEPRO CARB STEADY)  237 mL Oral BID AC & HS   fentaNYL   2 patch Transdermal Q72H   gabapentin   200 mg Oral TID   gatifloxacin   1 drop Right Eye BID   heparin   5,000 Units Subcutaneous Q8H   insulin  aspart  0-5 Units Subcutaneous QHS   insulin  aspart  0-6 Units Subcutaneous TID WC   lidocaine   2 patch Transdermal Q24H   methocarbamol   500 mg Oral TID   midodrine   10 mg Oral TID WC   midodrine   10 mg Oral Q T,Th,Sa-HD   multivitamin  1 tablet Oral QHS   pantoprazole   40 mg Oral BID AC   polyethylene glycol  17 g Oral Daily   sevelamer  carbonate  800 mg Oral TID WC   Continuous  Infusions:  vancomycin  Stopped (01/20/24 1700)   PRN Meds:.alteplase , fentaNYL  (SUBLIMAZE ) injection, glucagon  (human recombinant), heparin , hydrALAZINE , ibuprofen , ipratropium-albuterol , lidocaine -prilocaine , metoprolol  tartrate, ondansetron  (ZOFRAN ) IV, oxyCODONE , pentafluoroprop-tetrafluoroeth, sodium chloride  flush  DVT Prophylaxis  heparin  injection 5,000 Units Start: 12/15/23 1400     Recent Labs  Lab 01/15/24 1504 01/17/24 0924 01/19/24 0603 01/20/24 1252 01/22/24 0324  WBC 10.4 7.2 7.6 7.3 6.6  HGB 9.4* 9.4* 9.3* 9.0* 9.4*  HCT 29.3* 30.1* 29.5* 28.8* 28.8*  PLT 290 207 187 196 185  MCV 98.7 101.0* 99.0 100.0 97.6  MCH 31.6 31.5 31.2 31.3 31.9  MCHC 32.1 31.2 31.5 31.3 32.6  RDW 17.1* 17.8* 17.3* 17.3* 17.6*  LYMPHSABS  --   --  1.2  --  1.1  MONOABS  --   --  0.7  --  0.6  EOSABS  --   --  0.0  --  0.1  BASOSABS  --   --  0.1  --  0.1    Recent Labs  Lab 01/15/24 1503 01/17/24 0924 01/19/24 0603 01/20/24 1252 01/22/24 0324  NA 130* 131* 130* 128* 132*  K 5.0 4.3 3.9 4.0 4.1  CL 91* 94* 90* 90* 94*  CO2 21* 18* 21* 20* 20*  ANIONGAP 18* 19* 19* 18* 18*  GLUCOSE 102* 124* 159* 145* 164*  BUN 49* 37* 29* 40* 25*  CREATININE 6.42* 5.28* 4.24* 5.63* 4.06*  ALBUMIN  2.3* 2.5* 2.3* 2.3* 2.3*  PHOS 4.9* 3.7 3.7 4.1 3.4  CALCIUM  9.3 9.5 9.6 9.7 9.6      Recent Labs  Lab 01/15/24 1503 01/17/24 0924 01/19/24 0603 01/20/24 1252 01/22/24 0324  CALCIUM  9.3 9.5 9.6 9.7 9.6    --------------------------------------------------------------------------------------------------------------- Lab Results  Component Value Date   CHOL 166 04/16/2023   HDL 40 04/16/2023   LDLCALC 90 04/16/2023   TRIG 277 (H) 04/16/2023   CHOLHDL 4.2 04/16/2023    Lab Results  Component Value Date   HGBA1C 10.0 (A) 07/17/2023      Signature  -   Donalda Applebaum M.D on 01/22/2024 at 9:32 AM   -  To page go to [email or web address]

## 2024-01-22 NOTE — Progress Notes (Signed)
 The patient had completed dialysis treatment without fluid removed d/t hypotension. Meds given during HD : Midodrine  10 mg po.Oxycodone  10 mg po.Vancomycin  500 mg iv.  01/22/24 1233  Vitals  Temp 97.7 F (36.5 C)  BP 105/62  BP Location Right Arm  BP Method Automatic  Patient Position (if appropriate) Lying  Pulse Rate 90  Resp 16  Weight 40.3 kg  Type of Weight Post-Dialysis  Post Treatment  Dialyzer Clearance Lightly streaked  Hemodialysis Intake (mL) 0 mL  Liters Processed 77.8  Fluid Removed (mL) 0 mL  Tolerated HD Treatment Yes  Post-Hemodialysis Comments see notes.

## 2024-01-23 LAB — GLUCOSE, CAPILLARY
Glucose-Capillary: 124 mg/dL — ABNORMAL HIGH (ref 70–99)
Glucose-Capillary: 122 mg/dL — ABNORMAL HIGH (ref 70–99)
Glucose-Capillary: 133 mg/dL — ABNORMAL HIGH (ref 70–99)
Glucose-Capillary: 138 mg/dL — ABNORMAL HIGH (ref 70–99)

## 2024-01-23 MED ORDER — FENTANYL 50 MCG/HR TD PT72
1.0000 | MEDICATED_PATCH | TRANSDERMAL | Status: DC
  Administered 2024-01-25 – 2024-02-18 (×9): 1 via TRANSDERMAL
  Filled 2024-01-23 (×9): qty 1

## 2024-01-23 MED ORDER — CHLORHEXIDINE GLUCONATE CLOTH 2 % EX PADS
6.0000 | MEDICATED_PAD | Freq: Every day | CUTANEOUS | Status: DC
  Administered 2024-01-24 – 2024-02-17 (×14): 6 via TOPICAL

## 2024-01-23 NOTE — Plan of Care (Signed)

## 2024-01-23 NOTE — Plan of Care (Signed)
 Reviewed necessity of frequent position changes to avoid bedsores.  Pt verbalized understanding.

## 2024-01-23 NOTE — Progress Notes (Signed)
 [facility] Progress Note   Subjective:    Seen and examined patient at bedside. Ongoing L hip pain. Noted low Bps on HD yesterday but he remained asymptomatic. On Midodrine . Next HD 9/13.  Objective Vitals:   01/22/24 2028 01/23/24 0102 01/23/24 0400 01/23/24 0800  BP: 136/89 131/81 113/80 117/79  Pulse: 64 70 91 92  Resp: 19 18 17 18   Temp: 97.8 F (36.6 C) 98.9 F (37.2 C) 98 F (36.7 C) 98.4 F (36.9 C)  TempSrc: Oral Axillary Oral Oral  SpO2: 90% 98% 96%   Weight:      Height:       Physical Exam General: Frail man, NAD Heart: RRR Lungs: CTAB Abdomen: soft Extremities: no edema, B BKA Dialysis Access: [facility]  Filed Weights   01/20/24 1603 01/22/24 1233 01/22/24 1250  Weight: 40.7 kg 40.3 kg 40 kg    Intake/Output Summary (Last 24 hours) at 01/23/2024 1207 Last data filed at 01/22/2024 1233 Gross per 24 hour  Intake --  Output 0 ml  Net 0 ml    Additional Objective Labs: Basic Metabolic Panel: Recent Labs  Lab 01/19/24 0603 01/20/24 1252 01/22/24 0324  NA 130* 128* 132*  K 3.9 4.0 4.1  CL 90* 90* 94*  CO2 21* 20* 20*  GLUCOSE 159* 145* 164*  BUN 29* 40* 25*  CREATININE 4.24* 5.63* 4.06*  CALCIUM  9.6 9.7 9.6  PHOS 3.7 4.1 3.4   Liver Function Tests: Recent Labs  Lab 01/19/24 0603 01/20/24 1252 01/22/24 0324  ALBUMIN  2.3* 2.3* 2.3*   No results for input(s): LIPASE, AMYLASE in the last 168 hours. CBC: Recent Labs  Lab 01/17/24 0924 01/19/24 0603 01/20/24 1252 01/22/24 0324  WBC 7.2 7.6 7.3 6.6  NEUTROABS  --  5.5  --  4.8  HGB 9.4* 9.3* 9.0* 9.4*  HCT 30.1* 29.5* 28.8* 28.8*  MCV 101.0* 99.0 100.0 97.6  PLT 207 187 196 185   Blood Culture    Component Value Date/Time   SDES NEEDLE ASPIRATE 01/07/2024 1612   SPECREQUEST DISC 01/07/2024 1612   CULT  01/07/2024 1612    FEW METHICILLIN RESISTANT STAPHYLOCOCCUS AUREUS NO ANAEROBES ISOLATED Performed at [facility], [address], [place], [place]  [address]    REPTSTATUS 01/12/2024 FINAL 01/07/2024 1612    Cardiac Enzymes: No results for input(s): CKTOTAL, CKMB, CKMBINDEX, TROPONINI in the last 168 hours. CBG: Recent Labs  Lab 01/22/24 0822 01/22/24 1347 01/22/24 1604 01/22/24 2151 01/23/24 0858  GLUCAP 152* 104* 107* 174* 124*   Iron  Studies: No results for input(s): IRON , TIBC, TRANSFERRIN, FERRITIN in the last 72 hours. Lab Results  Component Value Date   INR 1.1 01/07/2024   INR 1.1 12/15/2023   INR 1.2 10/08/2023   Studies/Results: No results found.  Medications:  vancomycin  500 mg (01/22/24 1732)    (feeding supplement) PROSource Plus  30 mL Oral BID BM   acetaminophen   1,000 mg Oral TID   artificial tears   Right Eye QID   bictegravir-emtricitabine -tenofovir  AF  1 tablet Oral Daily   Chlorhexidine  Gluconate Cloth  6 each Topical Q0600   darbepoetin (ARANESP ) injection - DIALYSIS  150 mcg Subcutaneous Q Fri-1800   feeding supplement (NEPRO CARB STEADY)  237 mL Oral BID AC & HS   fentaNYL   2 patch Transdermal Q72H   [START ON 01/25/2024] fentaNYL   1 patch Transdermal Q72H   gabapentin   200 mg Oral TID   gatifloxacin   1 drop Right Eye BID  heparin   5,000 Units Subcutaneous Q8H   insulin  aspart  0-5 Units Subcutaneous QHS   insulin  aspart  0-6 Units Subcutaneous TID WC   lidocaine   2 patch Transdermal Q24H   methocarbamol   500 mg Oral TID   midodrine   10 mg Oral TID WC   midodrine   10 mg Oral Q T,Th,Sa-HD   multivitamin  1 tablet Oral QHS   pantoprazole   40 mg Oral BID AC   polyethylene glycol  17 g Oral Daily   sevelamer  carbonate  800 mg Oral TID WC    Dialysis Orders: [facility] - MWF 3.5h  B400  45.5kg  TDC  Heparin  2000 Mircera 100 mcg - last dose 12/01/23 Calcitriol  1.0 mcg (on hold due to high Ca) Uses midodrine  10mg  TID + extra pre-HD   Assessment/Plan: 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 S/p IR  aspiration 8/27 - growing MRSA. Not a surgical candidate. Pain management per primary.   ESRD: Historically MWF schedule - on TTS this admit, insistent to stay on that schedule - Next HD 9/13. BP/volume: Chronic hypotension on mido 10mg  TID and extra pre-HD. Below EDW and will need to be lowered at dc.  Anemia of ESRD: Hgb 9.4 - continue Aranesp  150mcg q Friday. No IV iron  d/t infection. HIV: controlled on Biktarvy  BMD: CorrCa high, VDRA on hold. Phos stable, on sevelamer  as binder.  Nutrition: Alb low, continue supps and diet has been liberalized. Debility: PT/OT as tolerated. Dispo: Very difficult placement. Noted patient is uninsured thus SNF is not an option.  Charmaine Piety, NP [facility] 01/23/2024,12:07 PM  LOS: 39 days

## 2024-01-23 NOTE — Progress Notes (Signed)
 PROGRESS NOTE        PATIENT DETAILS Name: Alvin Daniels Age: 53 y.o. Sex: male Date of Birth: February 07, 1971 Admit Date: 12/14/2023 Admitting Physician Marsha Ada, MD PCP:Pcp, No  Brief Summary:  Patient is a 53 y.o.  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  Significant events: 8/4>> admit to [facility]  Significant studies: 6/4>> TEE: EF 60-65%, 1.8 x 1.2 mass right atrial wall-?vegetation from old HD catheter impinged on RA wall-versus mass/tumor. 8/4>> MRI pelvis: Discitis/osteomyelitis L4-5-right psoas abscess. 8/5>> CT abdomen/pelvis: Discitis/osteomyelitis L4-5 with paraspinal inflammatory changes/right psoas abscess.  8/8>> MRI LS spine: L4-5 discitis/osteomyelitis with associated paraspinal inflammatory changes, right psoas abscess. 8/20>> MRI left hip: Discitis of the lower lumbar spine with extension into the psoas muscle. 8/20>> MRI left knee: BKA-no significant abnormality. 8/25>> MRI LS spine: Persistent/ongoing changes of osteomyelitis at L4/L5 with associated epidural phlegmon/abscess within the ventral epidural space at L4/L5.  Associated multiloculated bilateral psoas abscess measuring up to 3.1 cm on the left.  Significant microbiology data: 5/28>> blood culture: MRSA 5/29>> left finger abscess: E. coli/Streptococcus anginosus, Bacteroides fragilis. 8/4>> blood culture: No growth 8/27 >> L4-L5 disc aspirate culture: MRSA.    Procedures: 8/27 >> IR aspiration 8/27 of of L4-L5 disc aspirate  Consults: ID Nephrology Ophthalmology Dimensional radiology  Subjective: Unchanged-stable left hip pain-appears comfortable.  Objective: Vitals: Blood pressure 117/79, pulse  92, temperature 98.4 F (36.9 C), temperature source Oral, resp. rate 18, height 4' 11 (1.499 m), weight 40 kg, SpO2 96%.   Exam: Awake/alert Frail/deconditioned Abdomen: Soft nontender Extremities: S/p bilateral BKA's.  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 coli on bone culture) Continues to have left-sided hip pain-unchanged for the past several weeks On IV vancomycin -EOT 9/29 Per prior notes-not a surgical candidate-IR unable to drain abscess due to lack of safe window Will need reimaging at some point-perhaps closer to antibiotic EOT.  Syncope on 8/20 Probably related to soft blood pressure/orthostatic hypotension Telemetry monitoring unremarkable.  Chronic hypotension On midodrine  Narcotics for back pain very not helping. Minimize narcotics as much as possible-avoid further escalation in narcotic dosing.  ESRD on HD MWF Nephrology following.  Normocytic anemia Secondary to ESRD Hb stable Aranesp /iron  defer to nephrology service Follow  PAD-s/p bilateral BKA Normally uses prosthesis Due to severe pain-he has been unable to ambulate with a prosthesis.  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  Difficult disposition No family-uninsured-SNF not a option per SW-history of ESRD with bilateral BKA-currently unable to ambulate with his prostheses-hence unable to be discharged home safely.   Normally-prior to this hospitalization was able to ambulate with prosthesis and catch the  bus/scat transport to go back and forth from dialysis center.   DM-2 (A1c 10.0 on 3/6) CBG stable SSI  Recent Labs    01/22/24 1604 01/22/24 2151  01/23/24 0858  GLUCAP 107* 174* 124*        Code status:   Code Status: Full Code   DVT Prophylaxis: heparin  injection 5,000 Units Start: 12/15/23 1400   Family Communication: None at bedside    Disposition Plan: Status is: Inpatient Remains inpatient appropriate because: Severe of illness   Planned Discharge Destination:Home health   Diet: Diet Order             Diet regular Room service appropriate? Yes with Assist; Fluid consistency: Thin; Fluid restriction: 1200 mL Fluid  Diet effective now                   I have personally reviewed following labs and imaging studies  LABORATORY DATA:   Data Review:   Inpatient Medications  Scheduled Meds:  (feeding supplement) PROSource Plus  30 mL Oral BID BM   acetaminophen   1,000 mg Oral TID   artificial tears   Right Eye QID   bictegravir-emtricitabine -tenofovir  AF  1 tablet Oral Daily   Chlorhexidine  Gluconate Cloth  6 each Topical Q0600   darbepoetin (ARANESP ) injection - DIALYSIS  150 mcg Subcutaneous Q Fri-1800   feeding supplement (NEPRO CARB STEADY)  237 mL Oral BID AC & HS   fentaNYL   2 patch Transdermal Q72H   [START ON 01/25/2024] fentaNYL   1 patch Transdermal Q72H   gabapentin   200 mg Oral TID   gatifloxacin   1 drop Right Eye BID   heparin   5,000 Units Subcutaneous Q8H   insulin  aspart  0-5 Units Subcutaneous QHS   insulin  aspart  0-6 Units Subcutaneous TID WC   lidocaine   2 patch Transdermal Q24H   methocarbamol   500 mg Oral TID   midodrine   10 mg Oral TID WC   midodrine   10 mg Oral Q T,Th,Sa-HD   multivitamin  1 tablet Oral QHS   pantoprazole   40 mg Oral BID AC   polyethylene glycol  17 g Oral Daily   sevelamer  carbonate  800 mg Oral TID WC   Continuous Infusions:  vancomycin  500 mg (01/22/24 1732)   PRN Meds:.fentaNYL  (SUBLIMAZE )  injection, glucagon  (human recombinant), hydrALAZINE , ibuprofen , ipratropium-albuterol , metoprolol  tartrate, ondansetron  (ZOFRAN ) IV, oxyCODONE , sodium chloride  flush  DVT Prophylaxis  heparin  injection 5,000 Units Start: 12/15/23 1400     Recent Labs  Lab 01/17/24 0924 01/19/24 0603 01/20/24 1252 01/22/24 0324  WBC 7.2 7.6 7.3 6.6  HGB 9.4* 9.3* 9.0* 9.4*  HCT 30.1* 29.5* 28.8* 28.8*  PLT 207 187 196 185  MCV 101.0* 99.0 100.0 97.6  MCH 31.5 31.2 31.3 31.9  MCHC 31.2 31.5 31.3 32.6  RDW 17.8* 17.3* 17.3* 17.6*  LYMPHSABS  --  1.2  --  1.1  MONOABS  --  0.7  --  0.6  EOSABS  --  0.0  --  0.1  BASOSABS  --  0.1  --  0.1    Recent Labs  Lab 01/17/24 0924 01/19/24 0603 01/20/24 1252 01/22/24 0324  NA 131* 130* 128* 132*  K 4.3 3.9 4.0 4.1  CL 94* 90* 90* 94*  CO2 18* 21* 20* 20*  ANIONGAP 19* 19* 18* 18*  GLUCOSE 124* 159* 145* 164*  BUN 37* 29* 40* 25*  CREATININE 5.28* 4.24* 5.63* 4.06*  ALBUMIN  2.5* 2.3* 2.3* 2.3*  PHOS 3.7 3.7 4.1 3.4  CALCIUM  9.5 9.6 9.7 9.6      Recent Labs  Lab 01/17/24 0924 01/19/24 0603 01/20/24 1252 01/22/24 0324  CALCIUM  9.5 9.6 9.7 9.6    ---------------------------------------------------------------------------------------------------------------  Lab Results  Component Value Date   CHOL 166 04/16/2023   HDL 40 04/16/2023   LDLCALC 90 04/16/2023   TRIG 277 (H) 04/16/2023   CHOLHDL 4.2 04/16/2023    Lab Results  Component Value Date   HGBA1C 10.0 (A) 07/17/2023      Signature  -   Donalda Applebaum M.D on 01/23/2024 at 9:09 AM   -  To page go to [email or web address]

## 2024-01-24 LAB — RENAL FUNCTION PANEL
Albumin: 2.2 g/dL — ABNORMAL LOW (ref 3.5–5.0)
Anion gap: 13 (ref 5–15)
BUN: 23 mg/dL — ABNORMAL HIGH (ref 6–20)
CO2: 24 mmol/L (ref 22–32)
Calcium: 9.3 mg/dL (ref 8.9–10.3)
Chloride: 94 mmol/L — ABNORMAL LOW (ref 98–111)
Creatinine, Ser: 3.88 mg/dL — ABNORMAL HIGH (ref 0.61–1.24)
GFR, Estimated: 18 mL/min — ABNORMAL LOW (ref >60)
Glucose, Bld: 146 mg/dL — ABNORMAL HIGH (ref 70–99)
Phosphorus: 3.2 mg/dL (ref 2.5–4.6)
Potassium: 4 mmol/L (ref 3.5–5.1)
Sodium: 131 mmol/L — ABNORMAL LOW (ref 135–145)

## 2024-01-24 LAB — CBC WITH DIFFERENTIAL/PLATELET
Abs Immature Granulocytes: 0.02 K/uL (ref 0.00–0.07)
Basophils Absolute: 0.1 K/uL (ref 0.0–0.1)
Basophils Relative: 1 %
Eosinophils Absolute: 0 K/uL (ref 0.0–0.5)
Eosinophils Relative: 1 %
HCT: 30.5 % — ABNORMAL LOW (ref 39.0–52.0)
Hemoglobin: 9.9 g/dL — ABNORMAL LOW (ref 13.0–17.0)
Immature Granulocytes: 0 %
Lymphocytes Relative: 17 %
Lymphs Abs: 1.2 K/uL (ref 0.7–4.0)
MCH: 31.7 pg (ref 26.0–34.0)
MCHC: 32.5 g/dL (ref 30.0–36.0)
MCV: 97.8 fL (ref 80.0–100.0)
Monocytes Absolute: 0.9 K/uL (ref 0.1–1.0)
Monocytes Relative: 14 %
Neutro Abs: 4.5 K/uL (ref 1.7–7.7)
Neutrophils Relative %: 67 %
Platelets: 181 K/uL (ref 150–400)
RBC: 3.12 MIL/uL — ABNORMAL LOW (ref 4.22–5.81)
RDW: 18.3 % — ABNORMAL HIGH (ref 11.5–15.5)
WBC: 6.7 K/uL (ref 4.0–10.5)
nRBC: 0 % (ref 0.0–0.2)

## 2024-01-24 LAB — GLUCOSE, CAPILLARY
Glucose-Capillary: 146 mg/dL — ABNORMAL HIGH (ref 70–99)
Glucose-Capillary: 106 mg/dL — ABNORMAL HIGH (ref 70–99)
Glucose-Capillary: 119 mg/dL — ABNORMAL HIGH (ref 70–99)
Glucose-Capillary: 131 mg/dL — ABNORMAL HIGH (ref 70–99)

## 2024-01-24 MED ORDER — HEPARIN SODIUM (PORCINE) 1000 UNIT/ML DIALYSIS
1000.0000 [IU] | INTRAMUSCULAR | Status: DC | PRN
  Administered 2024-01-24: 3800 [IU] via INTRAVENOUS_CENTRAL

## 2024-01-24 MED ORDER — MIDODRINE HCL 5 MG PO TABS
ORAL_TABLET | ORAL | Status: AC
  Filled 2024-01-24: qty 2

## 2024-01-24 MED ORDER — HEPARIN SODIUM (PORCINE) 1000 UNIT/ML IJ SOLN
INTRAMUSCULAR | Status: AC
  Filled 2024-01-24: qty 4

## 2024-01-24 MED ORDER — OXYCODONE HCL 5 MG PO TABS
ORAL_TABLET | ORAL | Status: AC
  Filled 2024-01-24: qty 2

## 2024-01-24 NOTE — Progress Notes (Signed)
 [facility] Progress Note   Subjective:   Patient seen and examined at bedside. No complaints today.  Tolerating dialysis well.  Denies chest pain, shortness of breath and edema.   Objective Vitals:   01/24/24 0726 01/24/24 0810 01/24/24 0827 01/24/24 0930  BP: 105/85 102/76 122/77 138/84  Pulse: 91 90 88 88  Resp: 16 15 18 13   Temp: 97.9 F (36.6 C) 97.9 F (36.6 C)    TempSrc: Oral     SpO2:  100% 100% 100%  Weight:  39.6 kg    Height:       Physical Exam General:chronically ill appearing male in NAD Heart:RRR Lungs:CTAB, nml WOB on RA Abdomen:soft, NTND Extremities:no LE edema, b/l BKA Dialysis Access: [facility]   Filed Weights   01/22/24 1233 01/22/24 1250 01/24/24 0810  Weight: 40.3 kg 40 kg 39.6 kg   No intake or output data in the 24 hours ending 01/24/24 0940  Additional Objective Labs: Basic Metabolic Panel: Recent Labs  Lab 01/20/24 1252 01/22/24 0324 01/24/24 0423  NA 128* 132* 131*  K 4.0 4.1 4.0  CL 90* 94* 94*  CO2 20* 20* 24  GLUCOSE 145* 164* 146*  BUN 40* 25* 23*  CREATININE 5.63* 4.06* 3.88*  CALCIUM  9.7 9.6 9.3  PHOS 4.1 3.4 3.2   Liver Function Tests: Recent Labs  Lab 01/20/24 1252 01/22/24 0324 01/24/24 0423  ALBUMIN  2.3* 2.3* 2.2*   No results for input(s): LIPASE, AMYLASE in the last 168 hours. CBC: Recent Labs  Lab 01/19/24 0603 01/20/24 1252 01/22/24 0324 01/24/24 0423  WBC 7.6 7.3 6.6 6.7  NEUTROABS 5.5  --  4.8 4.5  HGB 9.3* 9.0* 9.4* 9.9*  HCT 29.5* 28.8* 28.8* 30.5*  MCV 99.0 100.0 97.6 97.8  PLT 187 196 185 181   Blood Culture    Component Value Date/Time   SDES NEEDLE ASPIRATE 01/07/2024 1612   SPECREQUEST DISC 01/07/2024 1612   CULT  01/07/2024 1612    FEW METHICILLIN RESISTANT STAPHYLOCOCCUS AUREUS NO ANAEROBES ISOLATED Performed at [facility], [address] [place], [place] [address]    REPTSTATUS 01/12/2024 FINAL 01/07/2024 1612   CBG: Recent Labs  Lab 01/23/24 0858  01/23/24 1210 01/23/24 1539 01/23/24 2058 01/24/24 0724  GLUCAP 124* 122* 133* 138* 146*    Medications:  vancomycin  500 mg (01/22/24 1732)    (feeding supplement) PROSource Plus  30 mL Oral BID BM   acetaminophen   1,000 mg Oral TID   artificial tears   Right Eye QID   bictegravir-emtricitabine -tenofovir  AF  1 tablet Oral Daily   Chlorhexidine  Gluconate Cloth  6 each Topical Q0600   darbepoetin (ARANESP ) injection - DIALYSIS  150 mcg Subcutaneous Q Fri-1800   feeding supplement (NEPRO CARB STEADY)  237 mL Oral BID AC & HS   fentaNYL   2 patch Transdermal Q72H   [START ON 01/25/2024] fentaNYL   1 patch Transdermal Q72H   gabapentin   200 mg Oral TID   gatifloxacin   1 drop Right Eye BID   heparin   5,000 Units Subcutaneous Q8H   insulin  aspart  0-5 Units Subcutaneous QHS   insulin  aspart  0-6 Units Subcutaneous TID WC   lidocaine   2 patch Transdermal Q24H   methocarbamol   500 mg Oral TID   midodrine   10 mg Oral TID WC   midodrine   10 mg Oral Q T,Th,Sa-HD   multivitamin  1 tablet Oral QHS   pantoprazole   40 mg Oral BID AC   polyethylene glycol  17  g Oral Daily   sevelamer  carbonate  800 mg Oral TID WC    Dialysis Orders: [facility] - MWF 3.5h  B400  45.5kg  TDC  Heparin  2000 Mircera 100 mcg - last dose 12/01/23 Calcitriol  1.0 mcg (on hold due to high Ca) Uses midodrine  10mg  TID + extra pre-HD    Assessment/Plan: 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 S/p IR aspiration 8/27 - growing MRSA. Not a surgical candidate. Pain management per primary.   ESRD: Historically MWF schedule - on TTS this admit, insistent to stay on that schedule - Next HD today.  BP/volume: Chronic hypotension on mido 10mg  TID and extra pre-HD. Below EDW and will need to be lowered at dc.  Anemia of ESRD: Hgb 9.9 - continue Aranesp  150mcg q Friday. No IV iron  d/t infection. HIV: controlled on Biktarvy  BMD: CorrCa high, VDRA on hold.  Phos on goal, on sevelamer  as binder.  Nutrition: Alb low, continue supps and diet has been liberalized. Debility: PT/OT as tolerated. Dispo: Very difficult placement. Noted patient is uninsured thus SNF is not an option.  No family or safe dispo plan at this time.   Manuelita Labella, PA-C [facility] 01/24/2024,9:40 AM  LOS: 40 days

## 2024-01-24 NOTE — Progress Notes (Signed)
 PROGRESS NOTE        PATIENT DETAILS Name: Alvin Daniels Age: 53 y.o. Sex: male Date of Birth: 06-13-1970 Admit Date: 12/14/2023 Admitting Physician Marsha Ada, MD PCP:Pcp, No  Brief Summary:  Patient is a 53 y.o.  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  Significant events: 8/4>> admit to [facility]  Significant studies: 6/4>> TEE: EF 60-65%, 1.8 x 1.2 mass right atrial wall-?vegetation from old HD catheter impinged on RA wall-versus mass/tumor. 8/4>> MRI pelvis: Discitis/osteomyelitis L4-5-right psoas abscess. 8/5>> CT abdomen/pelvis: Discitis/osteomyelitis L4-5 with paraspinal inflammatory changes/right psoas abscess.  8/8>> MRI LS spine: L4-5 discitis/osteomyelitis with associated paraspinal inflammatory changes, right psoas abscess. 8/20>> MRI left hip: Discitis of the lower lumbar spine with extension into the psoas muscle. 8/20>> MRI left knee: BKA-no significant abnormality. 8/25>> MRI LS spine: Persistent/ongoing changes of osteomyelitis at L4/L5 with associated epidural phlegmon/abscess within the ventral epidural space at L4/L5.  Associated multiloculated bilateral psoas abscess measuring up to 3.1 cm on the left.  Significant microbiology data: 5/28>> blood culture: MRSA 5/29>> left finger abscess: E. coli/Streptococcus anginosus, Bacteroides fragilis. 8/4>> blood culture: No growth 8/27 >> L4-L5 disc aspirate culture: MRSA.    Procedures: 8/27 >> IR aspiration 8/27 of of L4-L5 disc aspirate  Consults: ID Nephrology Ophthalmology Dimensional radiology  Subjective: Claims pain is mild-otherwise no major issues overnight.  Objective: Vitals: Blood pressure 99/64,  pulse 90, temperature 97.9 F (36.6 C), resp. rate 19, height 4' 11 (1.499 m), weight 39.6 kg, SpO2 100%.   Exam: Awake/alert Frail/deconditioned Abdomen: Soft nontender Extremities: S/p bilateral BKA's.  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 coli on bone culture) Continues to have left-sided hip pain-unchanged for the past several weeks On IV vancomycin -EOT 9/29 Per prior notes-not a surgical candidate-IR unable to drain abscess due to lack of safe window Will need reimaging at some point-perhaps closer to antibiotic EOT.  Syncope on 8/20 Probably related to soft blood pressure/orthostatic hypotension Telemetry monitoring unremarkable.  Chronic hypotension On midodrine  Narcotics for back pain very not helping. Minimize narcotics as much as possible-avoid further escalation in narcotic dosing.  ESRD on HD MWF Nephrology following.  Normocytic anemia Secondary to ESRD Hb stable Aranesp /iron  defer to nephrology service Follow  PAD-s/p bilateral BKA Normally uses prosthesis Due to severe pain-he has been unable to ambulate with a prosthesis.  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  Difficult disposition No family-uninsured-SNF not a option per SW-history of ESRD with bilateral BKA-currently unable to ambulate with his prostheses-hence unable to be discharged home safely.   Normally-prior to this hospitalization was able to ambulate with prosthesis and catch the bus/scat  transport to go back and forth from dialysis center.   DM-2 (A1c 10.0 on 3/6) CBG stable SSI  Recent Labs    01/23/24 1539 01/23/24 2058  01/24/24 0724  GLUCAP 133* 138* 146*        Code status:   Code Status: Full Code   DVT Prophylaxis: heparin  injection 5,000 Units Start: 12/15/23 1400   Family Communication: None at bedside    Disposition Plan: Status is: Inpatient Remains inpatient appropriate because: Severe of illness   Planned Discharge Destination:Home health   Diet: Diet Order             Diet regular Room service appropriate? Yes with Assist; Fluid consistency: Thin; Fluid restriction: 1200 mL Fluid  Diet effective now                   I have personally reviewed following labs and imaging studies  LABORATORY DATA:   Data Review:   Inpatient Medications  Scheduled Meds:  (feeding supplement) PROSource Plus  30 mL Oral BID BM   acetaminophen   1,000 mg Oral TID   artificial tears   Right Eye QID   bictegravir-emtricitabine -tenofovir  AF  1 tablet Oral Daily   Chlorhexidine  Gluconate Cloth  6 each Topical Q0600   darbepoetin (ARANESP ) injection - DIALYSIS  150 mcg Subcutaneous Q Fri-1800   feeding supplement (NEPRO CARB STEADY)  237 mL Oral BID AC & HS   fentaNYL   2 patch Transdermal Q72H   [START ON 01/25/2024] fentaNYL   1 patch Transdermal Q72H   gabapentin   200 mg Oral TID   gatifloxacin   1 drop Right Eye BID   heparin   5,000 Units Subcutaneous Q8H   insulin  aspart  0-5 Units Subcutaneous QHS   insulin  aspart  0-6 Units Subcutaneous TID WC   lidocaine   2 patch Transdermal Q24H   methocarbamol   500 mg Oral TID   midodrine   10 mg Oral TID WC   midodrine   10 mg Oral Q T,Th,Sa-HD   multivitamin  1 tablet Oral QHS   pantoprazole   40 mg Oral BID AC   polyethylene glycol  17 g Oral Daily   sevelamer  carbonate  800 mg Oral TID WC   Continuous Infusions:  vancomycin  500 mg (01/22/24 1732)   PRN Meds:.fentaNYL  (SUBLIMAZE ) injection,  glucagon  (human recombinant), hydrALAZINE , ibuprofen , ipratropium-albuterol , metoprolol  tartrate, ondansetron  (ZOFRAN ) IV, oxyCODONE , sodium chloride  flush  DVT Prophylaxis  heparin  injection 5,000 Units Start: 12/15/23 1400     Recent Labs  Lab 01/19/24 0603 01/20/24 1252 01/22/24 0324 01/24/24 0423  WBC 7.6 7.3 6.6 6.7  HGB 9.3* 9.0* 9.4* 9.9*  HCT 29.5* 28.8* 28.8* 30.5*  PLT 187 196 185 181  MCV 99.0 100.0 97.6 97.8  MCH 31.2 31.3 31.9 31.7  MCHC 31.5 31.3 32.6 32.5  RDW 17.3* 17.3* 17.6* 18.3*  LYMPHSABS 1.2  --  1.1 1.2  MONOABS 0.7  --  0.6 0.9  EOSABS 0.0  --  0.1 0.0  BASOSABS 0.1  --  0.1 0.1    Recent Labs  Lab 01/19/24 0603 01/20/24 1252 01/22/24 0324 01/24/24 0423  NA 130* 128* 132* 131*  K 3.9 4.0 4.1 4.0  CL 90* 90* 94* 94*  CO2 21* 20* 20* 24  ANIONGAP 19* 18* 18* 13  GLUCOSE 159* 145* 164* 146*  BUN 29* 40* 25* 23*  CREATININE 4.24* 5.63* 4.06* 3.88*  ALBUMIN  2.3* 2.3* 2.3* 2.2*  PHOS 3.7 4.1 3.4 3.2  CALCIUM  9.6 9.7 9.6 9.3      Recent Labs  Lab 01/19/24 0603 01/20/24 1252 01/22/24 0324 01/24/24 0423  CALCIUM  9.6 9.7 9.6 9.3    --------------------------------------------------------------------------------------------------------------- Lab Results  Component Value Date  CHOL 166 04/16/2023   HDL 40 04/16/2023   LDLCALC 90 04/16/2023   TRIG 277 (H) 04/16/2023   CHOLHDL 4.2 04/16/2023    Lab Results  Component Value Date   HGBA1C 10.0 (A) 07/17/2023      Signature  -   Donalda Applebaum M.D on 01/24/2024 at 10:53 AM   -  To page go to [email or web address]

## 2024-01-24 NOTE — Plan of Care (Signed)
°  Problem: Education: Goal: Knowledge of General Education information will improve Description: Including pain rating scale, medication(s)/side effects and non-pharmacologic comfort measures Outcome: Progressing   Problem: Clinical Measurements: Goal: Will remain free from infection Outcome: Progressing Goal: Diagnostic test results will improve Outcome: Progressing Goal: Respiratory complications will improve Outcome: Progressing Goal: Cardiovascular complication will be avoided Outcome: Progressing   Problem: Nutrition: Goal: Adequate nutrition will be maintained Outcome: Progressing   Problem: Safety: Goal: Ability to remain free from injury will improve Outcome: Progressing

## 2024-01-24 NOTE — Progress Notes (Signed)
 Received patient in bed to unit.  Alert and oriented.  Informed consent signed and in chart.   TX duration: 3 hours  Patient tolerated well.  Transported back to the room  Alert, without acute distress.  Hand-off given to patient's nurse.   Access used: R internal jugular HD cath Access issues: none  Total UF removed: 0mL Medication(s) given: Tylenol , oxycodone , midodrine    01/24/24 1133  Vitals  Temp 98 F (36.7 C)  BP 107/67  Pulse Rate 93  Resp 16  Oxygen Therapy  SpO2 100 %  O2 Device Room Air  Patient Activity (if Appropriate) In bed  Pulse Oximetry Type Continuous  During Treatment Monitoring  Blood Flow Rate (mL/min) 399 mL/min  Arterial Pressure (mmHg) -208.47 mmHg  Venous Pressure (mmHg) 163.83 mmHg  TMP (mmHg) 2.42 mmHg  Ultrafiltration Rate (mL/min) 279 mL/min  Dialysate Flow Rate (mL/min) 300 ml/min  Duration of HD Treatment -hour(s) 2.99 hour(s)  Cumulative Fluid Removed (mL) per Treatment  -2.63  HD Safety Checks Performed Yes  Intra-Hemodialysis Comments Tx completed;Tolerated well  Post Treatment  Dialyzer Clearance Clear  Liters Processed 68.1  Fluid Removed (mL) 0 mL  Tolerated HD Treatment Yes     Camellia Brasil LPN [facility]

## 2024-01-25 LAB — GLUCOSE, CAPILLARY
Glucose-Capillary: 120 mg/dL — ABNORMAL HIGH (ref 70–99)
Glucose-Capillary: 127 mg/dL — ABNORMAL HIGH (ref 70–99)
Glucose-Capillary: 111 mg/dL — ABNORMAL HIGH (ref 70–99)
Glucose-Capillary: 183 mg/dL — ABNORMAL HIGH (ref 70–99)

## 2024-01-25 NOTE — Plan of Care (Signed)
  Problem: Education: Goal: Knowledge of General Education information will improve Description: Including pain rating scale, medication(s)/side effects and non-pharmacologic comfort measures Outcome: Progressing   Problem: Health Behavior/Discharge Planning: Goal: Ability to manage health-related needs will improve Outcome: Progressing   Problem: Clinical Measurements: Goal: Will remain free from infection Outcome: Progressing Goal: Diagnostic test results will improve Outcome: Progressing   Problem: Activity: Goal: Risk for activity intolerance will decrease Outcome: Progressing   Problem: Nutrition: Goal: Adequate nutrition will be maintained Outcome: Progressing   Problem: Coping: Goal: Level of anxiety will decrease Outcome: Progressing

## 2024-01-25 NOTE — Progress Notes (Addendum)
 [facility] Progress Note   Subjective:   Patient seen and examined at bedside.  Tolerating dialysis well. Denies chest pain, abdominal pain, nausea, vomiting and diarrhea.  Reports swelling in his face, especially around his eyes.  States he can not see it but everyone keeps telling him his face looks swollen for the last couple weeks.  Denies pain.   Objective Vitals:   01/25/24 0001 01/25/24 0355 01/25/24 0820 01/25/24 1055  BP: 131/80 114/76 132/83 122/78  Pulse: 88 76    Resp: 12 14    Temp: 98 F (36.7 C) 97.6 F (36.4 C) 97.7 F (36.5 C) 98.5 F (36.9 C)  TempSrc: Oral Oral Oral Oral  SpO2: 93% 100%    Weight:      Height:       Physical Exam General:chronically ill appearing male in NAD, mild periorbital edema, L>R Heart:RRR Lungs:CTAB, nml WOB on RA Abdomen:soft, NTND Extremities:b/l BKA, no LE edema Dialysis Access: [facility]   Filed Weights   01/22/24 1250 01/24/24 0810 01/24/24 1134  Weight: 40 kg 39.6 kg 39.7 kg    Intake/Output Summary (Last 24 hours) at 01/25/2024 1106 Last data filed at 01/24/2024 1134 Gross per 24 hour  Intake --  Output 0 ml  Net 0 ml    Additional Objective Labs: Basic Metabolic Panel: Recent Labs  Lab 01/20/24 1252 01/22/24 0324 01/24/24 0423  NA 128* 132* 131*  K 4.0 4.1 4.0  CL 90* 94* 94*  CO2 20* 20* 24  GLUCOSE 145* 164* 146*  BUN 40* 25* 23*  CREATININE 5.63* 4.06* 3.88*  CALCIUM  9.7 9.6 9.3  PHOS 4.1 3.4 3.2   Liver Function Tests: Recent Labs  Lab 01/20/24 1252 01/22/24 0324 01/24/24 0423  ALBUMIN  2.3* 2.3* 2.2*   CBC: Recent Labs  Lab 01/19/24 0603 01/20/24 1252 01/22/24 0324 01/24/24 0423  WBC 7.6 7.3 6.6 6.7  NEUTROABS 5.5  --  4.8 4.5  HGB 9.3* 9.0* 9.4* 9.9*  HCT 29.5* 28.8* 28.8* 30.5*  MCV 99.0 100.0 97.6 97.8  PLT 187 196 185 181   Studies/Results: No results found.  Medications:  vancomycin  500 mg (01/24/24 1750)    (feeding supplement) PROSource Plus  30 mL Oral BID  BM   acetaminophen   1,000 mg Oral TID   artificial tears   Right Eye QID   bictegravir-emtricitabine -tenofovir  AF  1 tablet Oral Daily   Chlorhexidine  Gluconate Cloth  6 each Topical Q0600   darbepoetin (ARANESP ) injection - DIALYSIS  150 mcg Subcutaneous Q Fri-1800   feeding supplement (NEPRO CARB STEADY)  237 mL Oral BID AC & HS   fentaNYL   2 patch Transdermal Q72H   fentaNYL   1 patch Transdermal Q72H   gabapentin   200 mg Oral TID   gatifloxacin   1 drop Right Eye BID   heparin   5,000 Units Subcutaneous Q8H   insulin  aspart  0-5 Units Subcutaneous QHS   insulin  aspart  0-6 Units Subcutaneous TID WC   lidocaine   2 patch Transdermal Q24H   methocarbamol   500 mg Oral TID   midodrine   10 mg Oral TID WC   midodrine   10 mg Oral Q T,Th,Sa-HD   multivitamin  1 tablet Oral QHS   pantoprazole   40 mg Oral BID AC   polyethylene glycol  17 g Oral Daily   sevelamer  carbonate  800 mg Oral TID WC    Dialysis Orders: [facility] - MWF 3.5h  B400  45.5kg  TDC  Heparin  2000 Mircera 100 mcg -  last dose 12/01/23 Calcitriol  1.0 mcg (on hold due to high Ca) Uses midodrine  10mg  TID + extra pre-HD    Assessment/Plan: 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 S/p IR aspiration 8/27 - growing MRSA. Not a surgical candidate. Pain management per primary.   ESRD: Historically MWF schedule - on TTS this admit, insistent to stay on that schedule - Next HD 02/06/24 BP/volume: Chronic hypotension on mido 10mg  TID and extra pre-HD. Below EDW and will need to be lowered at dc. +periorbital edema,  no UF with HD for the last few weeks due to hypotension.  Increase UF as tolerated this week with SBP goal>80.  Anemia of ESRD: Hgb 9.9 - continue Aranesp  150mcg q Friday. No IV iron  d/t infection. HIV: controlled on Biktarvy  BMD: CorrCa high, VDRA on hold. Phos on goal, on sevelamer  as binder.  Nutrition: Alb low, continue supps and diet has been  liberalized. Debility: PT/OT as tolerated. Dispo: Very difficult placement. Noted patient is uninsured thus SNF is not an option.  No family or safe dispo plan at this time.  Manuelita Labella, PA-C [facility] 01/25/2024,11:06 AM  LOS: 41 days

## 2024-01-25 NOTE — Progress Notes (Signed)
 PROGRESS NOTE        PATIENT DETAILS Name: Alvin Daniels Age: 53 y.o. Sex: male Date of Birth: March 25, 1971 Admit Date: 12/14/2023 Admitting Physician Marsha Ada, MD PCP:Pcp, No  Brief Summary: Patient is a 53 y.o.  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  Significant events: 8/4>> admit to [facility]  Significant studies: 6/4>> TEE: EF 60-65%, 1.8 x 1.2 mass right atrial wall-?vegetation from old HD catheter impinged on RA wall-versus mass/tumor. 8/4>> MRI pelvis: Discitis/osteomyelitis L4-5-right psoas abscess. 8/5>> CT abdomen/pelvis: Discitis/osteomyelitis L4-5 with paraspinal inflammatory changes/right psoas abscess.  8/8>> MRI LS spine: L4-5 discitis/osteomyelitis with associated paraspinal inflammatory changes, right psoas abscess. 8/20>> MRI left hip: Discitis of the lower lumbar spine with extension into the psoas muscle. 8/20>> MRI left knee: BKA-no significant abnormality. 8/25>> MRI LS spine: Persistent/ongoing changes of osteomyelitis at L4/L5 with associated epidural phlegmon/abscess within the ventral epidural space at L4/L5.  Associated multiloculated bilateral psoas abscess measuring up to 3.1 cm on the left.  Significant microbiology data: 5/28>> blood culture: MRSA 5/29>> left finger abscess: E. coli/Streptococcus anginosus, Bacteroides fragilis. 8/4>> blood culture: No growth 8/27 >> L4-L5 disc aspirate culture: MRSA.    Procedures: 8/27 >> IR aspiration 8/27 of of L4-L5 disc aspirate  Consults: ID Nephrology Ophthalmology Interventional radiology  Subjective: No complaints-lying comfortably in bed.  Claims pain in his left hip/left thigh is minimal and  well-controlled.  Objective: Vitals: Blood pressure 122/78, pulse 76, temperature 98.5 F (36.9 C), temperature source Oral, resp. rate 14, height 4' 11 (1.499 m), weight 39.7 kg, SpO2 100%.   Exam: Awake/alert Frail/deconditioned Abdomen: Soft nontender Extremities: S/p bilateral BKA's.  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 coli on bone culture) Continues to have left-sided hip pain-unchanged for the past several weeks On IV vancomycin -EOT 9/29 Per prior notes-not a surgical candidate-IR unable to drain abscess due to lack of safe window Will need reimaging at some point-perhaps closer to antibiotic EOT.  Syncope on 8/20 Probably related to soft blood pressure/orthostatic hypotension Telemetry monitoring unremarkable.  Chronic hypotension On midodrine  Narcotics for back pain very not helping. Minimize narcotics as much as possible-avoid further escalation in narcotic dosing.  ESRD on HD MWF Nephrology following.  Normocytic anemia Secondary to ESRD Hb stable Aranesp /iron  defer to nephrology service Follow  PAD-s/p bilateral BKA Normally uses prosthesis Due to severe pain-he has been unable to ambulate with a prosthesis.  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  Difficult disposition No family-uninsured-SNF not a option per SW-history of ESRD with bilateral BKA-currently unable to ambulate with his prostheses-hence unable to be discharged home safely.   Normally-prior to  this hospitalization was able to ambulate with prosthesis and catch the bus/scat transport to go back and forth from dialysis center.   DM-2 (A1c 10.0 on 3/6) CBG stable  SSI  Recent Labs    01/24/24 2131 01/25/24 0823 01/25/24 1058  GLUCAP 131* 120* 127*        Code status:   Code Status: Full Code   DVT Prophylaxis: heparin  injection 5,000 Units Start: 12/15/23 1400   Family Communication: None at bedside    Disposition Plan: Status is: Inpatient Remains inpatient appropriate because: Severe of illness   Planned Discharge Destination:Home health   Diet: Diet Order             Diet regular Room service appropriate? Yes with Assist; Fluid consistency: Thin; Fluid restriction: 1200 mL Fluid  Diet effective now                   I have personally reviewed following labs and imaging studies  LABORATORY DATA:   Data Review:   Inpatient Medications  Scheduled Meds:  (feeding supplement) PROSource Plus  30 mL Oral BID BM   acetaminophen   1,000 mg Oral TID   artificial tears   Right Eye QID   bictegravir-emtricitabine -tenofovir  AF  1 tablet Oral Daily   Chlorhexidine  Gluconate Cloth  6 each Topical Q0600   darbepoetin (ARANESP ) injection - DIALYSIS  150 mcg Subcutaneous Q Fri-1800   feeding supplement (NEPRO CARB STEADY)  237 mL Oral BID AC & HS   fentaNYL   1 patch Transdermal Q72H   gabapentin   200 mg Oral TID   gatifloxacin   1 drop Right Eye BID   heparin   5,000 Units Subcutaneous Q8H   insulin  aspart  0-5 Units Subcutaneous QHS   insulin  aspart  0-6 Units Subcutaneous TID WC   lidocaine   2 patch Transdermal Q24H   methocarbamol   500 mg Oral TID   midodrine   10 mg Oral TID WC   midodrine   10 mg Oral Q T,Th,Sa-HD   multivitamin  1 tablet Oral QHS   pantoprazole   40 mg Oral BID AC   polyethylene glycol  17 g Oral Daily   sevelamer  carbonate  800 mg Oral TID WC   Continuous Infusions:  vancomycin  500 mg (01/24/24 1750)   PRN Meds:.fentaNYL   (SUBLIMAZE ) injection, glucagon  (human recombinant), hydrALAZINE , ibuprofen , ipratropium-albuterol , metoprolol  tartrate, ondansetron  (ZOFRAN ) IV, oxyCODONE , sodium chloride  flush  DVT Prophylaxis  heparin  injection 5,000 Units Start: 12/15/23 1400     Recent Labs  Lab 01/19/24 0603 01/20/24 1252 01/22/24 0324 01/24/24 0423  WBC 7.6 7.3 6.6 6.7  HGB 9.3* 9.0* 9.4* 9.9*  HCT 29.5* 28.8* 28.8* 30.5*  PLT 187 196 185 181  MCV 99.0 100.0 97.6 97.8  MCH 31.2 31.3 31.9 31.7  MCHC 31.5 31.3 32.6 32.5  RDW 17.3* 17.3* 17.6* 18.3*  LYMPHSABS 1.2  --  1.1 1.2  MONOABS 0.7  --  0.6 0.9  EOSABS 0.0  --  0.1 0.0  BASOSABS 0.1  --  0.1 0.1    Recent Labs  Lab 01/19/24 0603 01/20/24 1252 01/22/24 0324 01/24/24 0423  NA 130* 128* 132* 131*  K 3.9 4.0 4.1 4.0  CL 90* 90* 94* 94*  CO2 21* 20* 20* 24  ANIONGAP 19* 18* 18* 13  GLUCOSE 159* 145* 164* 146*  BUN 29* 40* 25* 23*  CREATININE 4.24* 5.63* 4.06* 3.88*  ALBUMIN  2.3* 2.3* 2.3* 2.2*  PHOS 3.7 4.1 3.4 3.2  CALCIUM  9.6 9.7 9.6 9.3      Recent Labs  Lab 01/19/24 0603 01/20/24 1252 01/22/24 0324 01/24/24 0423  CALCIUM  9.6 9.7 9.6 9.3    --------------------------------------------------------------------------------------------------------------- Lab Results  Component Value Date  CHOL 166 04/16/2023   HDL 40 04/16/2023   LDLCALC 90 04/16/2023   TRIG 277 (H) 04/16/2023   CHOLHDL 4.2 04/16/2023    Lab Results  Component Value Date   HGBA1C 10.0 (A) 07/17/2023      Signature  -   Donalda Applebaum M.D on 01/25/2024 at 1:21 PM   -  To page go to [email or web address]

## 2024-01-26 ENCOUNTER — Ambulatory Visit: Payer: Self-pay | Admitting: Infectious Disease

## 2024-01-26 LAB — GLUCOSE, CAPILLARY
Glucose-Capillary: 162 mg/dL — ABNORMAL HIGH (ref 70–99)
Glucose-Capillary: 75 mg/dL (ref 70–99)
Glucose-Capillary: 83 mg/dL (ref 70–99)
Glucose-Capillary: 115 mg/dL — ABNORMAL HIGH (ref 70–99)

## 2024-01-26 MED ORDER — BICTEGRAVIR-EMTRICITAB-TENOFOV 50-200-25 MG PO TABS
1.0000 | ORAL_TABLET | Freq: Every day | ORAL | Status: DC
  Administered 2024-01-26 – 2024-02-21 (×26): 1 via ORAL
  Filled 2024-01-26 (×28): qty 1

## 2024-01-26 NOTE — Progress Notes (Signed)
 Physical Therapy Discharge Patient Details Name: Alvin Daniels MRN: [id number] DOB: 05-Feb-1971 Today's Date: 01/26/2024 Time:  -     Patient discharged from PT services secondary to patient has refused 3 (three) consecutive times without medical reason. Pt repetitively tells PT/OT to come back on Sunday or Monday, although today is Monday. Pt is on scheduled meds for pain and continues to refuse PT despite maximum encouragement and education in benefits. Per department policy, pt will be discharged due to 3 consecutive refusals. Pt informed of discontinuation of PT services. Video interpreter used, [name] [phone]    Please see latest therapy progress note for current level of functioning and progress toward goals.    Progress and discharge plan discussed with patient and/or caregiver: Patient/Caregiver agrees with plan  GP  [name], PT, DPT Acute Rehabilitation Services Office [phone]      [name] 01/26/2024, 1:25 PM

## 2024-01-26 NOTE — Progress Notes (Signed)
 PROGRESS NOTE        PATIENT DETAILS Name: Alvin Daniels Age: 53 y.o. Sex: male Date of Birth: [birth year] Admit Date: 12/14/2023 Admitting Physician Marsha Ada, MD PCP:Pcp, No  Brief Summary: Patient is a 53 y.o.  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  Significant events: 8/4>> admit to [facility]  Significant studies: 6/4>> TEE: EF 60-65%, 1.8 x 1.2 mass right atrial wall-?vegetation from old HD catheter impinged on RA wall-versus mass/tumor. 8/4>> MRI pelvis: Discitis/osteomyelitis L4-5-right psoas abscess. 8/5>> CT abdomen/pelvis: Discitis/osteomyelitis L4-5 with paraspinal inflammatory changes/right psoas abscess.  8/8>> MRI LS spine: L4-5 discitis/osteomyelitis with associated paraspinal inflammatory changes, right psoas abscess. 8/20>> MRI left hip: Discitis of the lower lumbar spine with extension into the psoas muscle. 8/20>> MRI left knee: BKA-no significant abnormality. 8/25>> MRI LS spine: Persistent/ongoing changes of osteomyelitis at L4/L5 with associated epidural phlegmon/abscess within the ventral epidural space at L4/L5.  Associated multiloculated bilateral psoas abscess measuring up to 3.1 cm on the left.  Significant microbiology data: 5/28>> blood culture: MRSA 5/29>> left finger abscess: E. coli/Streptococcus anginosus, Bacteroides fragilis. 8/4>> blood culture: No growth 8/27 >> L4-L5 disc aspirate culture: MRSA.    Procedures: 8/27 >> IR aspiration 8/27 of of L4-L5 disc aspirate  Consults: ID Nephrology Ophthalmology Interventional radiology  Subjective: No complaints-pain well controlled  Objective: Vitals: Blood pressure 112/73, pulse 84, temperature  97.8 F (36.6 C), temperature source Oral, resp. rate 14, height 4' 11 (1.499 m), weight 39.7 kg, SpO2 100%.   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 coli on bone culture) Continues to have left-sided hip pain-unchanged for the past several weeks On IV vancomycin -EOT 9/29 Per prior notes-not a surgical candidate-IR unable to drain abscess due to lack of safe window Will need reimaging at some point-perhaps closer to antibiotic EOT.  Syncope on 8/20 Probably related to soft blood pressure/orthostatic hypotension Telemetry monitoring unremarkable.  Chronic hypotension On midodrine  Narcotics for back pain very not helping. Minimize narcotics as much as possible-avoid further escalation in narcotic dosing.  ESRD on HD MWF Nephrology following.  Normocytic anemia Secondary to ESRD Hb stable Aranesp /iron  defer to nephrology service Follow  PAD-s/p bilateral BKA Normally uses prosthesis Due to severe pain-he has been unable to ambulate with a prosthesis.  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  Difficult disposition No family-uninsured-SNF not a option per SW-history of ESRD with bilateral BKA-currently unable to ambulate with his prostheses-hence unable to be discharged home safely.   Normally-prior to this hospitalization was able to ambulate with prosthesis and catch the bus/scat transport to go back and forth from  dialysis center.   DM-2 (A1c 10.0 on 3/6) CBG stable SSI  Recent Labs    01/25/24 1516 01/25/24 2112 01/26/24 0804  GLUCAP 111* 183* 162*        Code status:   Code Status: Full Code   DVT Prophylaxis: heparin  injection 5,000 Units Start: 12/15/23 1400   Family Communication: None at bedside    Disposition Plan: Status is: Inpatient Remains inpatient appropriate because: Severe of illness   Planned Discharge Destination:Home health   Diet: Diet Order             Diet regular Room service appropriate? Yes with Assist; Fluid consistency: Thin; Fluid restriction: 1200 mL Fluid  Diet effective now                   I have personally reviewed following labs and imaging studies  LABORATORY DATA:   Data Review:   Inpatient Medications  Scheduled Meds:  (feeding supplement) PROSource Plus  30 mL Oral BID BM   acetaminophen   1,000 mg Oral TID   artificial tears   Right Eye QID   bictegravir-emtricitabine -tenofovir  AF  1 tablet Oral Daily   Chlorhexidine  Gluconate Cloth  6 each Topical Q0600   darbepoetin (ARANESP ) injection - DIALYSIS  150 mcg Subcutaneous Q Fri-1800   feeding supplement (NEPRO CARB STEADY)  237 mL Oral BID AC & HS   fentaNYL   1 patch Transdermal Q72H   gabapentin   200 mg Oral TID   gatifloxacin   1 drop Right Eye BID   heparin   5,000 Units Subcutaneous Q8H   insulin  aspart  0-5 Units Subcutaneous QHS   insulin  aspart  0-6 Units Subcutaneous TID WC   lidocaine   2 patch Transdermal Q24H   methocarbamol   500 mg Oral TID   midodrine   10 mg Oral TID WC   midodrine   10 mg Oral Q T,Th,Sa-HD   multivitamin  1 tablet Oral QHS   pantoprazole   40 mg Oral BID AC   polyethylene glycol  17 g Oral Daily   sevelamer  carbonate  800 mg Oral TID WC   Continuous Infusions:  vancomycin  500 mg (01/24/24 1750)   PRN Meds:.fentaNYL  (SUBLIMAZE ) injection, glucagon  (human recombinant), hydrALAZINE , ibuprofen , ipratropium-albuterol , metoprolol  tartrate,  ondansetron  (ZOFRAN ) IV, oxyCODONE , sodium chloride  flush  DVT Prophylaxis  heparin  injection 5,000 Units Start: 12/15/23 1400     Recent Labs  Lab 01/20/24 1252 01/22/24 0324 01/24/24 0423  WBC 7.3 6.6 6.7  HGB 9.0* 9.4* 9.9*  HCT 28.8* 28.8* 30.5*  PLT 196 185 181  MCV 100.0 97.6 97.8  MCH 31.3 31.9 31.7  MCHC 31.3 32.6 32.5  RDW 17.3* 17.6* 18.3*  LYMPHSABS  --  1.1 1.2  MONOABS  --  0.6 0.9  EOSABS  --  0.1 0.0  BASOSABS  --  0.1 0.1    Recent Labs  Lab 01/20/24 1252 01/22/24 0324 01/24/24 0423  NA 128* 132* 131*  K 4.0 4.1 4.0  CL 90* 94* 94*  CO2 20* 20* 24  ANIONGAP 18* 18* 13  GLUCOSE 145* 164* 146*  BUN 40* 25* 23*  CREATININE 5.63* 4.06* 3.88*  ALBUMIN  2.3* 2.3* 2.2*  PHOS 4.1 3.4 3.2  CALCIUM  9.7 9.6 9.3      Recent Labs  Lab 01/20/24 1252 01/22/24 0324 01/24/24 0423  CALCIUM  9.7 9.6 9.3    --------------------------------------------------------------------------------------------------------------- Lab Results  Component Value Date   CHOL 166 04/16/2023   HDL 40 04/16/2023   LDLCALC 90 04/16/2023   TRIG 277 (H) 04/16/2023   CHOLHDL 4.2 04/16/2023    Lab Results  Component Value Date   HGBA1C 10.0 (A) 07/17/2023  Signature  -   Donalda Applebaum M.D on 01/26/2024 at 8:18 AM   -  To page go to [email or web address]

## 2024-01-26 NOTE — Plan of Care (Signed)
 Pt requested resumption of PT/OT.  Stated he is in too much pain to work with therapy.  Request relayed to MD, but when OT arrived to room, pt refused.  [place] we could medicated prior to therapy to reduce pain.  Pt agreed and verbalized understanding.

## 2024-01-26 NOTE — Progress Notes (Signed)
 [facility] Progress Note   Subjective:  Seen in room. Chronic L thigh pain, no dyspnea/CP.  Objective Vitals:   01/25/24 2332 01/26/24 0400 01/26/24 0805 01/26/24 1154  BP: 112/67 115/68 112/73 104/69  Pulse: 79  84 87  Resp:      Temp: 98 F (36.7 C) 98.5 F (36.9 C) 97.8 F (36.6 C) 97.8 F (36.6 C)  TempSrc: Oral Oral Oral Oral  SpO2:      Weight:      Height:       Physical Exam General: Chronically ill man, NAD. Room air. + eyelid edema Heart: RRR Lungs: CTAB Abdomen: soft Extremities: B BKA, no LE edema Dialysis Access: [facility]  Additional Objective Labs: Basic Metabolic Panel: Recent Labs  Lab 01/20/24 1252 01/22/24 0324 01/24/24 0423  NA 128* 132* 131*  K 4.0 4.1 4.0  CL 90* 94* 94*  CO2 20* 20* 24  GLUCOSE 145* 164* 146*  BUN 40* 25* 23*  CREATININE 5.63* 4.06* 3.88*  CALCIUM  9.7 9.6 9.3  PHOS 4.1 3.4 3.2   Liver Function Tests: Recent Labs  Lab 01/20/24 1252 01/22/24 0324 01/24/24 0423  ALBUMIN  2.3* 2.3* 2.2*   CBC: Recent Labs  Lab 01/20/24 1252 01/22/24 0324 01/24/24 0423  WBC 7.3 6.6 6.7  NEUTROABS  --  4.8 4.5  HGB 9.0* 9.4* 9.9*  HCT 28.8* 28.8* 30.5*  MCV 100.0 97.6 97.8  PLT 196 185 181   Medications:  vancomycin  500 mg (01/24/24 1750)    (feeding supplement) PROSource Plus  30 mL Oral BID BM   acetaminophen   1,000 mg Oral TID   artificial tears   Right Eye QID   bictegravir-emtricitabine -tenofovir  AF  1 tablet Oral Daily   Chlorhexidine  Gluconate Cloth  6 each Topical Q0600   darbepoetin (ARANESP ) injection - DIALYSIS  150 mcg Subcutaneous Q Fri-1800   feeding supplement (NEPRO CARB STEADY)  237 mL Oral BID AC & HS   fentaNYL   1 patch Transdermal Q72H   gabapentin   200 mg Oral TID   gatifloxacin   1 drop Right Eye BID   heparin   5,000 Units Subcutaneous Q8H   insulin  aspart  0-5 Units Subcutaneous QHS   insulin  aspart  0-6 Units Subcutaneous TID WC   lidocaine   2 patch Transdermal Q24H   methocarbamol    500 mg Oral TID   midodrine   10 mg Oral TID WC   midodrine   10 mg Oral Q T,Th,Sa-HD   multivitamin  1 tablet Oral QHS   pantoprazole   40 mg Oral BID AC   polyethylene glycol  17 g Oral Daily   sevelamer  carbonate  800 mg Oral TID WC    Dialysis Orders [facility] - MWF 3.5h  B400  45.5kg  TDC  Heparin  2000 Mircera 100 mcg - last dose 12/01/23 Calcitriol  1.0 mcg (on hold due to high Ca) Uses midodrine  10mg  TID + extra pre-HD    Assessment/Plan: L4-5 discitis/osteomyelitis/psoas abscess: Vanc/Cefepime  through 8/28, now Vanc 500mg  q HD alone until 02/18/24 per ID. S/p IR aspiration 8/27 - growing MRSA. Not a surgical candidate. Pain management per primary.   ESRD: Historically MWF schedule - on TTS this admit, insistent to stay on that schedule, next tomorrow. BP/volume: Chronic hypotension on mido 10mg  TID and extra pre-HD. Below EDW and will need to be lowered at dc. +periorbital edema,  no UF with HD for the last few weeks due to hypotension.  Increase UF as tolerated this week with SBP goal > 80.  Anemia  of ESRD: Hgb 9.9 - continue Aranesp  150mcg q Friday. No IV iron  d/t infection. HIV: controlled on Biktarvy  BMD: CorrCa high, VDRA on hold. Phos on goal, on sevelamer  as binder.  Nutrition: Alb low, continue supps and diet has been liberalized. Debility: PT/OT as tolerated. Dispo: Very difficult placement. Noted patient is uninsured thus SNF is not an option.  No family or safe dispo plan at this time.   Izetta Boehringer, PA-C 01/26/2024, 12:04 PM  [facility]

## 2024-01-26 NOTE — Progress Notes (Signed)
 OT Cancellation Note  Patient Details Name: Alvin Daniels MRN: [id number] DOB: Dec 28, 1970   Cancelled Treatment:    Reason Eval/Treat Not Completed: Patient declined. [name], pt will be discharged due to 4 consecutive refusals. Pt informed of discontinuation of OT services. Video interpreter used, [name] #[id number]  [name] 01/26/2024, 12:48 PM [name], OTR/L Acute Rehabilitation Services Office: [phone]

## 2024-01-26 NOTE — Plan of Care (Signed)
  Problem: Clinical Measurements: Goal: Will remain free from infection Outcome: Progressing Goal: Diagnostic test results will improve Outcome: Progressing   Problem: Activity: Goal: Risk for activity intolerance will decrease Outcome: Progressing   Problem: Coping: Goal: Level of anxiety will decrease Outcome: Progressing   Problem: Elimination: Goal: Will not experience complications related to bowel motility Outcome: Progressing   Problem: Pain Managment: Goal: General experience of comfort will improve and/or be controlled Outcome: Progressing   Problem: Safety: Goal: Ability to remain free from injury will improve Outcome: Progressing

## 2024-01-26 NOTE — Progress Notes (Incomplete)
 Nutrition Follow-up  DOCUMENTATION CODES:   Severe malnutrition in context of chronic illness  INTERVENTION:  Continue liberalized diet to maximize intake  Modify 30 ml ProSource Plus daily, each supplement provides 100 kcals and 15 grams protein.   Modify Nepro Shake po daily before breakfast and at bedtime, each supplement provides 425 kcal and 19 grams protein   Continue renal MVI  NUTRITION DIAGNOSIS:  Severe Malnutrition related to chronic illness as evidenced by severe muscle depletion, severe fat depletion.  GOAL:  Patient will meet greater than or equal to 90% of their needs  MONITOR:  PO intake, Supplement acceptance  REASON FOR ASSESSMENT:   Rounds Poor PO  ASSESSMENT:   Pt with PMH significant for: ESRD-HD, HIV, PAD s/p bilateral BKA, chronic HCV. Of note, recently admiteed 5/28 - 6/06 for MRSA bacteremia with left fourth finger osteomyelitis (amputation 5/29). Presenting to hospital w/ R hip pain and found to have L4-L5 osteomyelitis with abscess.  Patient consistently refusing PT/OT and has been dropped from service. States he is in too much pain to participate. Unable to ambulate with prosthesis and, therefore, unsafe to discharge.    Average Meal Intake No recent intake documented for review   Unable to speak with patient today as he was in dialysis at time of attempted check in. Continues with quite a few acquaintances who visit and bring in food from outside the hospital. RN unable to report around recent intake as he had early breakfast this morning in preparation for dialysis. Noted to be accepting one Nepro per day. Refused both yesterday. Will modify to daily to promote acceptance. Same for Prosource.   Admit Weight: 40.3 kg Current Weight: 42.4 kg Last HD tx: 9/16  UF achieved: ongoing   Wt stable. No edema, per nephrology. Only rinseback being removed during his dialysis txs. BPs soft. Continue to monitor need for challenging, as he has likely lost  weight d/t poor intake. No SOB reported. New stage II to sacrum noted.    Drains/Lines: LUE: AVF  HDC: right internal jugular (double lumen) UOP: anuric   Sodium remains low. Could be some fluid overload and/or poor oral intake. PHOS/K+ within range for dialysis patient. CRP elevated on 8/25. Needs new A1c.    Meds: Biktarvy , darbepoetin alfa , fentanyl  patch, SS Novolog  0-5 QHS, SS Novolog  0-6 TID, pantoprazole , Miralax , sevelamer  carbonate, IV ABX  Labs from 9/13 reviewed: Na+ 131>132>130 (L) K+ 4.0 (wdl) PHOS 3.2 (wdl) CRP 16.1 (8/25) CBGs 853-835 x24 hours A1c 10.0 (07/2023)  Diet Order:   Diet Order             Diet regular Room service appropriate? Yes with Assist; Fluid consistency: Thin; Fluid restriction: 1200 mL Fluid  Diet effective now             EDUCATION NEEDS:  Education needs have been addressed  Skin:  Skin Assessment: Skin Integrity Issues: Skin Integrity Issues:: Stage II Stage II: sacrum  Last BM:  9/13  Height:  Ht Readings from Last 1 Encounters:  12/14/23 4' 11 (1.499 m)   Weight:  Wt Readings from Last 1 Encounters:  01/27/24 42.4 kg   Ideal Body Weight:  45.5 kg  BMI:  Body mass index is 18.88 kg/m.  Estimated Nutritional Needs:   Kcal:  1400-1600 kcals  Protein:  65-80g  Fluid:  1L + UOP  Blair Deaner MS, RD, LDN Registered Dietitian Clinical Nutrition RD Inpatient Contact Info in Amion

## 2024-01-26 NOTE — Progress Notes (Signed)
 Occupational Therapy Discharge Patient Details Name: Travoris Bushey MRN: [id number] DOB: 01/11/1971 Today's Date: 01/26/2024 Time:  -     Patient discharged from OT services secondary to 4 consecutive refusals..  Please see latest therapy progress note for current level of functioning and progress toward goals.    Progress and discharge plan discussed with patient and/or caregiver       [name] 01/26/2024, 12:49 PM  [name], OTR/L Acute Rehabilitation Services Office: [phone]

## 2024-01-27 ENCOUNTER — Ambulatory Visit: Payer: Self-pay | Admitting: Infectious Disease

## 2024-01-27 LAB — GLUCOSE, CAPILLARY
Glucose-Capillary: 138 mg/dL — ABNORMAL HIGH (ref 70–99)
Glucose-Capillary: 87 mg/dL (ref 70–99)
Glucose-Capillary: 84 mg/dL (ref 70–99)
Glucose-Capillary: 130 mg/dL — ABNORMAL HIGH (ref 70–99)

## 2024-01-27 LAB — VANCOMYCIN, RANDOM: Vancomycin Rm: 29 ug/mL

## 2024-01-27 LAB — CBC
HCT: 29.6 % — ABNORMAL LOW (ref 39.0–52.0)
Hemoglobin: 9.5 g/dL — ABNORMAL LOW (ref 13.0–17.0)
MCH: 31.9 pg (ref 26.0–34.0)
MCHC: 32.1 g/dL (ref 30.0–36.0)
MCV: 99.3 fL (ref 80.0–100.0)
Platelets: 193 K/uL (ref 150–400)
RBC: 2.98 MIL/uL — ABNORMAL LOW (ref 4.22–5.81)
RDW: 18.5 % — ABNORMAL HIGH (ref 11.5–15.5)
WBC: 8.2 K/uL (ref 4.0–10.5)
nRBC: 0 % (ref 0.0–0.2)

## 2024-01-27 LAB — RENAL FUNCTION PANEL
Albumin: 2.2 g/dL — ABNORMAL LOW (ref 3.5–5.0)
Anion gap: 14 (ref 5–15)
BUN: 36 mg/dL — ABNORMAL HIGH (ref 6–20)
CO2: 24 mmol/L (ref 22–32)
Calcium: 9.6 mg/dL (ref 8.9–10.3)
Chloride: 93 mmol/L — ABNORMAL LOW (ref 98–111)
Creatinine, Ser: 4.99 mg/dL — ABNORMAL HIGH (ref 0.61–1.24)
GFR, Estimated: 13 mL/min — ABNORMAL LOW (ref >60)
Glucose, Bld: 142 mg/dL — ABNORMAL HIGH (ref 70–99)
Phosphorus: 3.5 mg/dL (ref 2.5–4.6)
Potassium: 4.7 mmol/L (ref 3.5–5.1)
Sodium: 131 mmol/L — ABNORMAL LOW (ref 135–145)

## 2024-01-27 MED ORDER — PENTAFLUOROPROP-TETRAFLUOROETH EX AERO: 1.0000 | INHALATION_SPRAY | CUTANEOUS | Status: DC | PRN

## 2024-01-27 MED ORDER — LIDOCAINE HCL (PF) 1 % IJ SOLN: 5.0000 mL | INTRAMUSCULAR | Status: DC | PRN

## 2024-01-27 MED ORDER — ALTEPLASE 2 MG IJ SOLR: 2.0000 mg | Freq: Once | INTRAMUSCULAR | Status: DC | PRN

## 2024-01-27 MED ORDER — PROSOURCE PLUS PO LIQD
30.0000 mL | Freq: Every day | ORAL | Status: DC
  Administered 2024-01-28 – 2024-02-21 (×20): 30 mL via ORAL
  Filled 2024-01-27 (×22): qty 30

## 2024-01-27 MED ORDER — HEPARIN SODIUM (PORCINE) 1000 UNIT/ML DIALYSIS: 1000.0000 [IU] | INTRAMUSCULAR | Status: DC | PRN

## 2024-01-27 MED ORDER — LIDOCAINE-PRILOCAINE 2.5-2.5 % EX CREA: 1.0000 | TOPICAL_CREAM | CUTANEOUS | Status: DC | PRN

## 2024-01-27 MED ORDER — LIDOCAINE HCL (PF) 1 % IJ SOLN
5.0000 mL | INTRAMUSCULAR | Status: DC | PRN
Start: 2024-01-27 — End: 2024-01-27

## 2024-01-27 MED ORDER — OXYCODONE HCL 5 MG PO TABS
ORAL_TABLET | ORAL | Status: AC
  Filled 2024-01-27: qty 2

## 2024-01-27 MED ORDER — MIDODRINE HCL 5 MG PO TABS
ORAL_TABLET | ORAL | Status: AC
  Filled 2024-01-27: qty 2

## 2024-01-27 MED ORDER — ANTICOAGULANT SODIUM CITRATE 4% (200MG/5ML) IV SOLN: 5.0000 mL | Status: DC | PRN

## 2024-01-27 MED ORDER — HEPARIN SODIUM (PORCINE) 1000 UNIT/ML IJ SOLN
INTRAMUSCULAR | Status: AC
  Filled 2024-01-27: qty 5

## 2024-01-27 NOTE — Progress Notes (Signed)
 PROGRESS NOTE        PATIENT DETAILS Name: Alvin Daniels Age: 53 y.o. Sex: male Date of Birth: 04-29-1971 Admit Date: 12/14/2023 Admitting Physician Marsha Ada, MD PCP:Pcp, No  Brief Summary: Patient is a 53 y.o.  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  Significant events: 8/4>> admit to [facility]  Significant studies: 6/4>> TEE: EF 60-65%, 1.8 x 1.2 mass right atrial wall-?vegetation from old HD catheter impinged on RA wall-versus mass/tumor. 8/4>> MRI pelvis: Discitis/osteomyelitis L4-5-right psoas abscess. 8/5>> CT abdomen/pelvis: Discitis/osteomyelitis L4-5 with paraspinal inflammatory changes/right psoas abscess.  8/8>> MRI LS spine: L4-5 discitis/osteomyelitis with associated paraspinal inflammatory changes, right psoas abscess. 8/20>> MRI left hip: Discitis of the lower lumbar spine with extension into the psoas muscle. 8/20>> MRI left knee: BKA-no significant abnormality. 8/25>> MRI LS spine: Persistent/ongoing changes of osteomyelitis at L4/L5 with associated epidural phlegmon/abscess within the ventral epidural space at L4/L5.  Associated multiloculated bilateral psoas abscess measuring up to 3.1 cm on the left.  Significant microbiology data: 5/28>> blood culture: MRSA 5/29>> left finger abscess: E. coli/Streptococcus anginosus, Bacteroides fragilis. 8/4>> blood culture: No growth 8/27 >> L4-L5 disc aspirate culture: MRSA.    Procedures: 8/27 >> IR aspiration 8/27 of of L4-L5 disc aspirate  Consults: ID Nephrology Ophthalmology Interventional radiology  Subjective: No major issues overnight-lying comfortably in bed.  Objective: Vitals: Blood pressure 93/89, pulse  (!) 34, temperature 98.6 F (37 C), temperature source Oral, resp. rate 10, height 4' 11 (1.499 m), weight 42.4 kg, SpO2 (!) 71%.   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 coli on bone culture) Continues to have left-sided hip pain-unchanged for the past several weeks On IV vancomycin -EOT 9/29 Per prior notes-not a surgical candidate-IR unable to drain abscess due to lack of safe window Will need reimaging at some point-perhaps closer to antibiotic EOT.  Syncope on 8/20 Probably related to soft blood pressure/orthostatic hypotension Telemetry monitoring unremarkable.  Chronic hypotension On midodrine  Narcotics for back pain very not helping. Minimize narcotics as much as possible-avoid further escalation in narcotic dosing.  ESRD on HD MWF Nephrology following.  Normocytic anemia Secondary to ESRD Hb stable Aranesp /iron  defer to nephrology service Follow  PAD-s/p bilateral BKA Normally uses prosthesis Due to severe pain-he has been unable to ambulate with a prosthesis.  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  Difficult disposition No family-uninsured-SNF not a option per SW-history of ESRD with bilateral BKA-currently unable to ambulate with his prostheses-hence unable to be discharged home safely.   Normally-prior to  this hospitalization was able to ambulate with prosthesis and catch the bus/scat transport to go back and forth from dialysis center.   DM-2 (A1c  10.0 on 3/6) CBG stable SSI  Recent Labs    01/26/24 1606 01/26/24 2115 01/27/24 0735  GLUCAP 83 115* 138*        Code status:   Code Status: Full Code   DVT Prophylaxis: heparin  injection 5,000 Units Start: 12/15/23 1400   Family Communication: None at bedside    Disposition Plan: Status is: Inpatient Remains inpatient appropriate because: Severe of illness   Planned Discharge Destination:Home health   Diet: Diet Order             Diet regular Room service appropriate? Yes with Assist; Fluid consistency: Thin; Fluid restriction: 1200 mL Fluid  Diet effective now                   I have personally reviewed following labs and imaging studies  LABORATORY DATA:   Data Review:   Inpatient Medications  Scheduled Meds:  [START ON 01/28/2024] (feeding supplement) PROSource Plus  30 mL Oral Daily   acetaminophen   1,000 mg Oral TID   artificial tears   Right Eye QID   bictegravir-emtricitabine -tenofovir  AF  1 tablet Oral Daily   Chlorhexidine  Gluconate Cloth  6 each Topical Q0600   darbepoetin (ARANESP ) injection - DIALYSIS  150 mcg Subcutaneous Q Fri-1800   feeding supplement (NEPRO CARB STEADY)  237 mL Oral BID AC & HS   fentaNYL   1 patch Transdermal Q72H   gabapentin   200 mg Oral TID   gatifloxacin   1 drop Right Eye BID   heparin   5,000 Units Subcutaneous Q8H   insulin  aspart  0-5 Units Subcutaneous QHS   insulin  aspart  0-6 Units Subcutaneous TID WC   lidocaine   2 patch Transdermal Q24H   methocarbamol   500 mg Oral TID   midodrine   10 mg Oral TID WC   midodrine   10 mg Oral Q T,Th,Sa-HD   multivitamin  1 tablet Oral QHS   pantoprazole   40 mg Oral BID AC   polyethylene glycol  17 g Oral Daily   sevelamer  carbonate  800 mg Oral TID WC   Continuous Infusions:  anticoagulant sodium citrate      vancomycin   500 mg (01/24/24 1750)   PRN Meds:.alteplase , anticoagulant sodium citrate , fentaNYL  (SUBLIMAZE ) injection, glucagon  (human recombinant), heparin , hydrALAZINE , ibuprofen , ipratropium-albuterol , lidocaine  (PF), lidocaine -prilocaine , metoprolol  tartrate, ondansetron  (ZOFRAN ) IV, oxyCODONE , pentafluoroprop-tetrafluoroeth, sodium chloride  flush  DVT Prophylaxis  heparin  injection 5,000 Units Start: 12/15/23 1400     Recent Labs  Lab 01/20/24 1252 01/22/24 0324 01/24/24 0423 01/27/24 1021  WBC 7.3 6.6 6.7 8.2  HGB 9.0* 9.4* 9.9* 9.5*  HCT 28.8* 28.8* 30.5* 29.6*  PLT 196 185 181 193  MCV 100.0 97.6 97.8 99.3  MCH 31.3 31.9 31.7 31.9  MCHC 31.3 32.6 32.5 32.1  RDW 17.3* 17.6* 18.3* 18.5*  LYMPHSABS  --  1.1 1.2  --   MONOABS  --  0.6 0.9  --   EOSABS  --  0.1 0.0  --   BASOSABS  --  0.1 0.1  --     Recent Labs  Lab 01/20/24 1252 01/22/24 0324 01/24/24 0423 01/27/24 1021  NA 128* 132* 131* 131*  K 4.0 4.1 4.0 4.7  CL 90* 94* 94* 93*  CO2 20* 20* 24 24  ANIONGAP 18* 18* 13 14  GLUCOSE 145* 164* 146* 142*  BUN 40* 25* 23* 36*  CREATININE 5.63* 4.06* 3.88* 4.99*  ALBUMIN  2.3* 2.3* 2.2* 2.2*  PHOS 4.1 3.4 3.2 3.5  CALCIUM  9.7 9.6 9.3 9.6  Recent Labs  Lab 01/20/24 1252 01/22/24 0324 01/24/24 0423 01/27/24 1021  CALCIUM  9.7 9.6 9.3 9.6    --------------------------------------------------------------------------------------------------------------- Lab Results  Component Value Date   CHOL 166 04/16/2023   HDL 40 04/16/2023   LDLCALC 90 04/16/2023   TRIG 277 (H) 04/16/2023   CHOLHDL 4.2 04/16/2023    Lab Results  Component Value Date   HGBA1C 10.0 (A) 07/17/2023      Signature  -   Donalda Applebaum M.D on 01/27/2024 at 11:00 AM   -  To page go to [email or web address]

## 2024-01-27 NOTE — Progress Notes (Signed)
 Massive ecchymosis noted on patient's abdomen, informed Dr. Sundil.

## 2024-01-27 NOTE — Progress Notes (Signed)
  Received patient in bed to unit.   Informed consent signed and in chart.    TX duration:3     Transported by  Hand-off given to patient's nurse.    Access used: CVC Access issues: None Refused 2 times dressing change   Total UF removed: 0.5 Medication(s) given: Midodrine  and oxy Post HD VS: 105/73      Hunter Hacking LPN [facility]

## 2024-01-27 NOTE — Plan of Care (Signed)
  Problem: Clinical Measurements: Goal: Diagnostic test results will improve Outcome: Not Progressing   Problem: Elimination: Goal: Will not experience complications related to bowel motility Outcome: Not Progressing   Problem: Pain Managment: Goal: General experience of comfort will improve and/or be controlled Outcome: Not Progressing   Problem: Safety: Goal: Ability to remain free from injury will improve Outcome: Not Progressing

## 2024-01-27 NOTE — Progress Notes (Signed)
 [facility] Progress Note   Subjective:  Seen on HD - 1L UFG and tolerating. No CP/dyspnea, ongoing leg pain.  Objective Vitals:   01/27/24 0900 01/27/24 0930 01/27/24 1000 01/27/24 1030  BP: 123/80 109/70 (!) 78/59 93/89  Pulse: (!) 28 60 85 (!) 34  Resp: (!) 0 16 11 10   Temp:      TempSrc:      SpO2: (!) 72% 98% 100% (!) 71%  Weight:      Height:       Physical Exam General: Chronically ill man, NAD. Room air. + eyelid edema Heart: RRR Lungs: CTAB Abdomen: soft Extremities: B BKA, no LE edema Dialysis Access: [facility]  Additional Objective Labs: Basic Metabolic Panel: Recent Labs  Lab 01/22/24 0324 01/24/24 0423 01/27/24 1021  NA 132* 131* 131*  K 4.1 4.0 4.7  CL 94* 94* 93*  CO2 20* 24 24  GLUCOSE 164* 146* 142*  BUN 25* 23* 36*  CREATININE 4.06* 3.88* 4.99*  CALCIUM  9.6 9.3 9.6  PHOS 3.4 3.2 3.5   Liver Function Tests: Recent Labs  Lab 01/22/24 0324 01/24/24 0423 01/27/24 1021  ALBUMIN  2.3* 2.2* 2.2*   CBC: Recent Labs  Lab 01/20/24 1252 01/22/24 0324 01/24/24 0423 01/27/24 1021  WBC 7.3 6.6 6.7 8.2  NEUTROABS  --  4.8 4.5  --   HGB 9.0* 9.4* 9.9* 9.5*  HCT 28.8* 28.8* 30.5* 29.6*  MCV 100.0 97.6 97.8 99.3  PLT 196 185 181 193   Medications:  anticoagulant sodium citrate      vancomycin  500 mg (01/24/24 1750)    [START ON 01/28/2024] (feeding supplement) PROSource Plus  30 mL Oral Daily   acetaminophen   1,000 mg Oral TID   artificial tears   Right Eye QID   bictegravir-emtricitabine -tenofovir  AF  1 tablet Oral Daily   Chlorhexidine  Gluconate Cloth  6 each Topical Q0600   darbepoetin (ARANESP ) injection - DIALYSIS  150 mcg Subcutaneous Q Fri-1800   feeding supplement (NEPRO CARB STEADY)  237 mL Oral BID AC & HS   fentaNYL   1 patch Transdermal Q72H   gabapentin   200 mg Oral TID   gatifloxacin   1 drop Right Eye BID   heparin   5,000 Units Subcutaneous Q8H   insulin  aspart  0-5 Units Subcutaneous QHS   insulin  aspart  0-6 Units  Subcutaneous TID WC   lidocaine   2 patch Transdermal Q24H   methocarbamol   500 mg Oral TID   midodrine   10 mg Oral TID WC   midodrine   10 mg Oral Q T,Th,Sa-HD   multivitamin  1 tablet Oral QHS   pantoprazole   40 mg Oral BID AC   polyethylene glycol  17 g Oral Daily   sevelamer  carbonate  800 mg Oral TID WC    Dialysis Orders [facility] - MWF 3.5h  B400  45.5kg  TDC  Heparin  2000 Mircera 100 mcg - last dose 12/01/23 Calcitriol  1.0 mcg (on hold due to high Ca) Uses midodrine  10mg  TID + extra pre-HD    Assessment/Plan: L4-5 discitis/osteomyelitis/psoas abscess: Vanc/Cefepime  through 8/28, now Vanc 500mg  q HD alone until 02/18/24 per ID. S/p IR aspiration 8/27 - growing MRSA. Not a surgical candidate. Pain management per primary.   ESRD: Historically MWF schedule - on TTS this admit, insistent to stay on that schedule, HD now. BP/volume: Chronic hypotension on mido 10mg  TID and extra pre-HD. Below EDW and will need to be lowered at dc. +periorbital edema,  no UF with HD for the last few weeks  due to hypotension.  Increase UF as tolerated this week with SBP goal > 80.  Anemia of ESRD: Hgb 9.5 - continue Aranesp  150mcg q Friday. No IV iron  d/t infection. HIV: controlled on Biktarvy  BMD: CorrCa high, VDRA on hold. Phos on goal, on sevelamer  as binder.  Nutrition: Alb low, continue supps and diet has been liberalized. Debility: PT/OT as tolerated. Dispo: Very difficult placement. Noted patient is uninsured thus SNF is not an option.  No family or safe dispo plan at this time.     Izetta Boehringer, PA-C 01/27/2024, 11:48 AM  [employer]

## 2024-01-27 NOTE — Progress Notes (Signed)
 Pharmacy Antibiotic Note  Alvin Daniels is a 53 y.o. male admitted on 12/14/2023 with osteomyelitis.  Pharmacy has been consulted for vancomycin  dosing. He is ESRD on TTS HD.  Next HD planned for today- 9/16.,   Pre HD vanc level was 29 which is acceptable   Plan: Continue Vancomycin  500mg  IV qHD TTS Monitor HD schedule/tolerance, clinical progress Vanc levels weekly Abx end date: 9/29   Height: 4' 11 (149.9 cm) Weight: 39.7 kg (87 lb 8.4 oz) IBW/kg (Calculated) : 47.7  Temp (24hrs), Avg:97.9 F (36.6 C), Min:97.6 F (36.4 C), Max:98.2 F (36.8 C)  Recent Labs  Lab 01/20/24 1252 01/22/24 0324 01/24/24 0423 01/27/24 0418  WBC 7.3 6.6 6.7  --   CREATININE 5.63* 4.06* 3.88*  --   VANCORANDOM  --   --   --  29     Vanc 8/5 >> (9/29) Cefepime  8/5 >> 8/28 Cipro  8/14 >> 8/15 Biktarvy  (continued from PTA)    8/4 blood: negative 8/27 surgical/needle aspirate: few MRSA 8/27 disc biopsy by IR +MRSA  Massie Fila, PharmD Clinical Pharmacist  01/27/2024 8:05 AM

## 2024-01-27 NOTE — Progress Notes (Signed)
 PT Cancellation Note  Patient Details Name: Sayeed Weatherall MRN: [id number] DOB: 1971/01/15   Cancelled Treatment:    Reason Eval/Treat Not Completed: Other (comment) New PT eval order received as pt told RN he wanted to resume PT/OT services. Pt was premedicated with scheduled prn pain medication one hour prior to entry and was not due for additional medications. Arrived with Raquel, in person interpreter. Pt sitting up in bed, but continues to refuse to attempt to don prosthetics or transfer out of bed to a wheelchair. Pt states he could cry, with amount of pain he is in and we are trying to force him to do something that is going to cause him more pain. Explained managing expectations of pain and role of PT and mobilizing. Pt not agreeable and discussed that PT is removing him from caseload per departmental policy of fourth consecutive refusal. Pt verbalized understanding. He states he will do it on his own.  [name], PT, DPT Acute Rehabilitation Services Office [phone]    [name] 01/27/2024, 3:24 PM

## 2024-01-28 LAB — GLUCOSE, CAPILLARY
Glucose-Capillary: 138 mg/dL — ABNORMAL HIGH (ref 70–99)
Glucose-Capillary: 106 mg/dL — ABNORMAL HIGH (ref 70–99)
Glucose-Capillary: 96 mg/dL (ref 70–99)

## 2024-01-28 LAB — HEMOGLOBIN A1C
Hgb A1c MFr Bld: 5.9 % — ABNORMAL HIGH (ref 4.8–5.6)
Mean Plasma Glucose: 122.63 mg/dL

## 2024-01-28 NOTE — Plan of Care (Signed)
  Problem: Education: Goal: Knowledge of General Education information will improve Description: Including pain rating scale, medication(s)/side effects and non-pharmacologic comfort measures Outcome: Progressing   Problem: Health Behavior/Discharge Planning: Goal: Ability to manage health-related needs will improve Outcome: Progressing   Problem: Clinical Measurements: Goal: Ability to maintain clinical measurements within normal limits will improve Outcome: Progressing Goal: Will remain free from infection Outcome: Progressing Goal: Diagnostic test results will improve Outcome: Progressing Goal: Respiratory complications will improve Outcome: Progressing Goal: Cardiovascular complication will be avoided Outcome: Progressing   Problem: Activity: Goal: Risk for activity intolerance will decrease Outcome: Progressing   Problem: Nutrition: Goal: Adequate nutrition will be maintained Outcome: Progressing   Problem: Coping: Goal: Level of anxiety will decrease Outcome: Progressing   Problem: Elimination: Goal: Will not experience complications related to bowel motility Outcome: Progressing Goal: Will not experience complications related to urinary retention Outcome: Progressing   Problem: Pain Managment: Goal: General experience of comfort will improve and/or be controlled Outcome: Progressing   Problem: Safety: Goal: Ability to remain free from injury will improve Outcome: Progressing   Problem: Education: Goal: Ability to describe self-care measures that may prevent or decrease complications (Diabetes Survival Skills Education) will improve Outcome: Progressing Goal: Individualized Educational Video(s) Outcome: Progressing   Problem: Coping: Goal: Ability to adjust to condition or change in health will improve Outcome: Progressing   Problem: Fluid Volume: Goal: Ability to maintain a balanced intake and output will improve Outcome: Progressing   Problem:  Metabolic: Goal: Ability to maintain appropriate glucose levels will improve Outcome: Progressing   Problem: Nutritional: Goal: Maintenance of adequate nutrition will improve Outcome: Progressing Goal: Progress toward achieving an optimal weight will improve Outcome: Progressing   Problem: Skin Integrity: Goal: Risk for impaired skin integrity will decrease Outcome: Progressing   Problem: Tissue Perfusion: Goal: Adequacy of tissue perfusion will improve Outcome: Progressing

## 2024-01-28 NOTE — Progress Notes (Signed)
 PROGRESS NOTE        PATIENT DETAILS Name: Alvin Daniels Age: 53 y.o. Sex: male Date of Birth: 03-06-1971 Admit Date: 12/14/2023 Admitting Physician Marsha Ada, MD PCP:Pcp, No  Brief Summary: Patient is a 53 y.o.  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  Significant events: 8/4>> admit to [facility]  Significant studies: 6/4>> TEE: EF 60-65%, 1.8 x 1.2 mass right atrial wall-?vegetation from old HD catheter impinged on RA wall-versus mass/tumor. 8/4>> MRI pelvis: Discitis/osteomyelitis L4-5-right psoas abscess. 8/5>> CT abdomen/pelvis: Discitis/osteomyelitis L4-5 with paraspinal inflammatory changes/right psoas abscess.  8/8>> MRI LS spine: L4-5 discitis/osteomyelitis with associated paraspinal inflammatory changes, right psoas abscess. 8/20>> MRI left hip: Discitis of the lower lumbar spine with extension into the psoas muscle. 8/20>> MRI left knee: BKA-no significant abnormality. 8/25>> MRI LS spine: Persistent/ongoing changes of osteomyelitis at L4/L5 with associated epidural phlegmon/abscess within the ventral epidural space at L4/L5.  Associated multiloculated bilateral psoas abscess measuring up to 3.1 cm on the left.  Significant microbiology data: 5/28>> blood culture: MRSA 5/29>> left finger abscess: E. coli/Streptococcus anginosus, Bacteroides fragilis. 8/4>> blood culture: No growth 8/27 >> L4-L5 disc aspirate culture: MRSA.    Procedures: 8/27 >> IR aspiration 8/27 of of L4-L5 disc aspirate  Consults: ID Nephrology Ophthalmology Interventional radiology  Subjective: No major issues overnight-lying comfortably in bed.  Objective: Vitals: Blood pressure 120/85, pulse  91, temperature 98.6 F (37 C), temperature source Oral, resp. rate 17, height 4' 11 (1.499 m), weight 42.4 kg, SpO2 (!) 71%.   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 coli on bone culture) Continues to have left-sided hip pain-unchanged for the past several weeks On IV vancomycin -EOT 9/29 Per prior notes-not a surgical candidate-IR unable to drain abscess due to lack of safe window Will need reimaging at some point-perhaps closer to antibiotic EOT.  Syncope on 8/20 Probably related to soft blood pressure/orthostatic hypotension Telemetry monitoring unremarkable.  Chronic hypotension On midodrine  Narcotics for back pain very not helping. Minimize narcotics as much as possible-avoid further escalation in narcotic dosing.  ESRD on HD MWF Nephrology following.  Normocytic anemia Secondary to ESRD Hb stable Aranesp /iron  defer to nephrology service Follow  PAD-s/p bilateral BKA Normally uses prosthesis Due to severe pain-he has been unable to ambulate with a prosthesis.  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  Difficult disposition No family-uninsured-SNF not a option per SW-history of ESRD with bilateral BKA-currently unable to ambulate with his prostheses-hence unable to be discharged home safely.   Normally-prior to this hospitalization was able to ambulate with prosthesis and catch the bus/scat transport to go back and  forth from dialysis center.   DM-2 (A1c 10.0 on 3/6) CBG stable SSI  Recent Labs    01/27/24 1558 01/27/24 2134 01/28/24  1118  GLUCAP 84 130* 138*        Code status:   Code Status: Full Code   DVT Prophylaxis: heparin  injection 5,000 Units Start: 12/15/23 1400   Family Communication: None at bedside    Disposition Plan: Status is: Inpatient Remains inpatient appropriate because: Severe of illness   Planned Discharge Destination:Home health   Diet: Diet Order             Diet regular Room service appropriate? Yes with Assist; Fluid consistency: Thin; Fluid restriction: 1200 mL Fluid  Diet effective now                   I have personally reviewed following labs and imaging studies  LABORATORY DATA:   Data Review:   Inpatient Medications  Scheduled Meds:  (feeding supplement) PROSource Plus  30 mL Oral Daily   acetaminophen   1,000 mg Oral TID   artificial tears   Right Eye QID   bictegravir-emtricitabine -tenofovir  AF  1 tablet Oral Daily   Chlorhexidine  Gluconate Cloth  6 each Topical Q0600   darbepoetin (ARANESP ) injection - DIALYSIS  150 mcg Subcutaneous Q Fri-1800   feeding supplement (NEPRO CARB STEADY)  237 mL Oral BID AC & HS   fentaNYL   1 patch Transdermal Q72H   gabapentin   200 mg Oral TID   gatifloxacin   1 drop Right Eye BID   heparin   5,000 Units Subcutaneous Q8H   insulin  aspart  0-5 Units Subcutaneous QHS   insulin  aspart  0-6 Units Subcutaneous TID WC   lidocaine   2 patch Transdermal Q24H   methocarbamol   500 mg Oral TID   midodrine   10 mg Oral TID WC   midodrine   10 mg Oral Q T,Th,Sa-HD   multivitamin  1 tablet Oral QHS   pantoprazole   40 mg Oral BID AC   polyethylene glycol  17 g Oral Daily   sevelamer  carbonate  800 mg Oral TID WC   Continuous Infusions:  vancomycin  Stopped (01/27/24 1951)   PRN Meds:.fentaNYL  (SUBLIMAZE ) injection, glucagon  (human recombinant), hydrALAZINE , ibuprofen , ipratropium-albuterol , metoprolol   tartrate, ondansetron  (ZOFRAN ) IV, oxyCODONE , sodium chloride  flush  DVT Prophylaxis  heparin  injection 5,000 Units Start: 12/15/23 1400     Recent Labs  Lab 01/22/24 0324 01/24/24 0423 01/27/24 1021  WBC 6.6 6.7 8.2  HGB 9.4* 9.9* 9.5*  HCT 28.8* 30.5* 29.6*  PLT 185 181 193  MCV 97.6 97.8 99.3  MCH 31.9 31.7 31.9  MCHC 32.6 32.5 32.1  RDW 17.6* 18.3* 18.5*  LYMPHSABS 1.1 1.2  --   MONOABS 0.6 0.9  --   EOSABS 0.1 0.0  --   BASOSABS 0.1 0.1  --     Recent Labs  Lab 01/22/24 0324 01/24/24 0423 01/27/24 1021 01/28/24 0400  NA 132* 131* 131*  --   K 4.1 4.0 4.7  --   CL 94* 94* 93*  --   CO2 20* 24 24  --   ANIONGAP 18* 13 14  --   GLUCOSE 164* 146* 142*  --   BUN 25* 23* 36*  --   CREATININE 4.06* 3.88* 4.99*  --   ALBUMIN  2.3* 2.2* 2.2*  --   HGBA1C  --   --   --  5.9*  PHOS 3.4 3.2 3.5  --   CALCIUM  9.6 9.3 9.6  --       Recent Labs  Lab 01/22/24 0324 01/24/24 0423 01/27/24 1021 01/28/24 0400  HGBA1C  --   --   --  5.9*  CALCIUM  9.6 9.3 9.6  --     --------------------------------------------------------------------------------------------------------------- Lab Results  Component Value Date   CHOL 166 04/16/2023   HDL 40 04/16/2023   LDLCALC 90 04/16/2023   TRIG 277 (H) 04/16/2023   CHOLHDL 4.2 04/16/2023    Lab Results  Component Value Date   HGBA1C 5.9 (H) 01/28/2024      Signature  -   Donalda Applebaum M.D on 01/28/2024 at 3:02 PM   -  To page go to [email or web address]

## 2024-01-28 NOTE — Progress Notes (Signed)
 [facility] Progress Note   Subjective:  Seen in room - ongoing pain but doing ok otherwise. Did ok with HD yesterday, only 0.5L off.  Objective Vitals:   01/27/24 2020 01/27/24 2350 01/28/24 0829 01/28/24 1120  BP: 134/78 125/70  120/85  Pulse:   91   Resp:  17    Temp: 98.5 F (36.9 C) 98.7 F (37.1 C)  98.6 F (37 C)  TempSrc: Oral Oral Oral Oral  SpO2:      Weight:      Height:       Physical Exam General: Chronically ill man, NAD. Room air. + supraorbital edema Heart: RRR Lungs: CTAB Abdomen: soft Extremities: B BKA, no LE edema Dialysis Access: [facility]  Additional Objective Labs: Basic Metabolic Panel: Recent Labs  Lab 01/22/24 0324 01/24/24 0423 01/27/24 1021  NA 132* 131* 131*  K 4.1 4.0 4.7  CL 94* 94* 93*  CO2 20* 24 24  GLUCOSE 164* 146* 142*  BUN 25* 23* 36*  CREATININE 4.06* 3.88* 4.99*  CALCIUM  9.6 9.3 9.6  PHOS 3.4 3.2 3.5   Liver Function Tests: Recent Labs  Lab 01/22/24 0324 01/24/24 0423 01/27/24 1021  ALBUMIN  2.3* 2.2* 2.2*   CBC: Recent Labs  Lab 01/22/24 0324 01/24/24 0423 01/27/24 1021  WBC 6.6 6.7 8.2  NEUTROABS 4.8 4.5  --   HGB 9.4* 9.9* 9.5*  HCT 28.8* 30.5* 29.6*  MCV 97.6 97.8 99.3  PLT 185 181 193   Medications:  vancomycin  Stopped (01/27/24 1951)    (feeding supplement) PROSource Plus  30 mL Oral Daily   acetaminophen   1,000 mg Oral TID   artificial tears   Right Eye QID   bictegravir-emtricitabine -tenofovir  AF  1 tablet Oral Daily   Chlorhexidine  Gluconate Cloth  6 each Topical Q0600   darbepoetin (ARANESP ) injection - DIALYSIS  150 mcg Subcutaneous Q Fri-1800   feeding supplement (NEPRO CARB STEADY)  237 mL Oral BID AC & HS   fentaNYL   1 patch Transdermal Q72H   gabapentin   200 mg Oral TID   gatifloxacin   1 drop Right Eye BID   heparin   5,000 Units Subcutaneous Q8H   insulin  aspart  0-5 Units Subcutaneous QHS   insulin  aspart  0-6 Units Subcutaneous TID WC   lidocaine   2 patch Transdermal  Q24H   methocarbamol   500 mg Oral TID   midodrine   10 mg Oral TID WC   midodrine   10 mg Oral Q T,Th,Sa-HD   multivitamin  1 tablet Oral QHS   pantoprazole   40 mg Oral BID AC   polyethylene glycol  17 g Oral Daily   sevelamer  carbonate  800 mg Oral TID WC   Dialysis Orders [facility] - MWF 3.5h  B400  45.5kg  TDC  Heparin  2000 Mircera 100 mcg - last dose 12/01/23 Calcitriol  1.0 mcg (on hold due to high Ca) Uses midodrine  10mg  TID + extra pre-HD    Assessment/Plan: L4-5 discitis/osteomyelitis/psoas abscess: Vanc/Cefepime  through 8/28, now Vanc 500mg  q HD alone until 02/18/24 per ID. S/p IR aspiration 8/27 - growing MRSA. Not a surgical candidate. Pain management per primary.   ESRD: Historically MWF schedule - on TTS this admit, insistent to stay on that schedule, HD now. BP/volume: Chronic hypotension on mido 10mg  TID and extra pre-HD. Below EDW and will need to be lowered at dc. +periorbital edema, UF as tolerated. Anemia of ESRD: Hgb 9.5 - continue Aranesp  150mcg q Friday. No IV iron  d/t infection. HIV: controlled on Biktarvy  BMD:  CorrCa high, VDRA on hold. Phos on goal, on sevelamer  as binder.  Nutrition: Alb low, continue supps and diet has been liberalized. Debility: PT/OT as tolerated. Dispo: Very difficult placement. Noted patient is uninsured thus SNF is not an option.  No family or safe dispo plan at this time.     Izetta Boehringer, PA-C 01/28/2024, 12:01 PM  [employer]

## 2024-01-29 LAB — RENAL FUNCTION PANEL
Albumin: 2.1 g/dL — ABNORMAL LOW (ref 3.5–5.0)
Anion gap: 18 — ABNORMAL HIGH (ref 5–15)
BUN: 31 mg/dL — ABNORMAL HIGH (ref 6–20)
CO2: 22 mmol/L (ref 22–32)
Calcium: 9.5 mg/dL (ref 8.9–10.3)
Chloride: 92 mmol/L — ABNORMAL LOW (ref 98–111)
Creatinine, Ser: 4.54 mg/dL — ABNORMAL HIGH (ref 0.61–1.24)
GFR, Estimated: 15 mL/min — ABNORMAL LOW (ref >60)
Glucose, Bld: 95 mg/dL (ref 70–99)
Phosphorus: 4.4 mg/dL (ref 2.5–4.6)
Potassium: 4.8 mmol/L (ref 3.5–5.1)
Sodium: 132 mmol/L — ABNORMAL LOW (ref 135–145)

## 2024-01-29 LAB — CBC
HCT: 28.8 % — ABNORMAL LOW (ref 39.0–52.0)
Hemoglobin: 9.2 g/dL — ABNORMAL LOW (ref 13.0–17.0)
MCH: 31.8 pg (ref 26.0–34.0)
MCHC: 31.9 g/dL (ref 30.0–36.0)
MCV: 99.7 fL (ref 80.0–100.0)
Platelets: 185 K/uL (ref 150–400)
RBC: 2.89 MIL/uL — ABNORMAL LOW (ref 4.22–5.81)
RDW: 18.4 % — ABNORMAL HIGH (ref 11.5–15.5)
WBC: 6.3 K/uL (ref 4.0–10.5)
nRBC: 0 % (ref 0.0–0.2)

## 2024-01-29 LAB — GLUCOSE, CAPILLARY
Glucose-Capillary: 78 mg/dL (ref 70–99)
Glucose-Capillary: 72 mg/dL (ref 70–99)
Glucose-Capillary: 90 mg/dL (ref 70–99)

## 2024-01-29 MED ORDER — VANCOMYCIN HCL 500 MG/100ML IV SOLN
INTRAVENOUS | Status: AC
  Filled 2024-01-29: qty 100

## 2024-01-29 MED ORDER — MIDODRINE HCL 5 MG PO TABS
ORAL_TABLET | ORAL | Status: AC
  Filled 2024-01-29: qty 2

## 2024-01-29 MED ORDER — ALBUMIN HUMAN 25 % IV SOLN
INTRAVENOUS | Status: AC
  Filled 2024-01-29: qty 100

## 2024-01-29 MED ORDER — ALBUMIN HUMAN 25 % IV SOLN
25.0000 g | Freq: Once | INTRAVENOUS | Status: AC
Start: 2024-01-29 — End: 2024-01-29
  Administered 2024-01-29: 25 g via INTRAVENOUS

## 2024-01-29 NOTE — Progress Notes (Signed)
 PROGRESS NOTE        PATIENT DETAILS Name: Alvin Daniels Age: 53 y.o. Sex: male Date of Birth: [birth year] Admit Date: 12/14/2023 Admitting Physician Marsha Ada, MD PCP:Pcp, No  Brief Summary: Patient is a 53 y.o.  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  Significant events: 8/4>> admit to [facility]  Significant studies: 6/4>> TEE: EF 60-65%, 1.8 x 1.2 mass right atrial wall-?vegetation from old HD catheter impinged on RA wall-versus mass/tumor. 8/4>> MRI pelvis: Discitis/osteomyelitis L4-5-right psoas abscess. 8/5>> CT abdomen/pelvis: Discitis/osteomyelitis L4-5 with paraspinal inflammatory changes/right psoas abscess.  8/8>> MRI LS spine: L4-5 discitis/osteomyelitis with associated paraspinal inflammatory changes, right psoas abscess. 8/20>> MRI left hip: Discitis of the lower lumbar spine with extension into the psoas muscle. 8/20>> MRI left knee: BKA-no significant abnormality. 8/25>> MRI LS spine: Persistent/ongoing changes of osteomyelitis at L4/L5 with associated epidural phlegmon/abscess within the ventral epidural space at L4/L5.  Associated multiloculated bilateral psoas abscess measuring up to 3.1 cm on the left.  Significant microbiology data: 5/28>> blood culture: MRSA 5/29>> left finger abscess: E. coli/Streptococcus anginosus, Bacteroides fragilis. 8/4>> blood culture: No growth 8/27 >> L4-L5 disc aspirate culture: MRSA.    Procedures: 8/27 >> IR aspiration 8/27 of of L4-L5 disc aspirate  Consults: ID Nephrology Ophthalmology Interventional radiology  Subjective: No major issues overnight-unchanged.  Objective: Vitals: Blood pressure 110/69, pulse 76, temperature  98.7 F (37.1 C), resp. rate 12, height 4' 11 (1.499 m), weight 39.6 kg, SpO2 100%.   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 coli on bone culture) Continues to have left-sided hip pain-unchanged for the past several weeks On IV vancomycin -EOT 9/29 Per prior notes-not a surgical candidate-IR unable to drain abscess due to lack of safe window Will need reimaging at some point-perhaps closer to antibiotic EOT.  Syncope on 8/20 Probably related to soft blood pressure/orthostatic hypotension Telemetry monitoring unremarkable.  Chronic hypotension On midodrine  Narcotics for back pain very not helping. Minimize narcotics as much as possible-avoid further escalation in narcotic dosing.  ESRD on HD MWF Nephrology following.  Normocytic anemia Secondary to ESRD Hb stable Aranesp /iron  defer to nephrology service Follow  PAD-s/p bilateral BKA Normally uses prosthesis Due to severe pain-he has been unable to ambulate with a prosthesis.  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  Difficult disposition No family-uninsured-SNF not a option per SW-history of ESRD with bilateral BKA-currently unable to ambulate with his prostheses-hence unable to be discharged home safely.   Normally-prior to this hospitalization was able to ambulate with prosthesis and catch the bus/scat transport to go back and forth from dialysis center.   DM-2 (A1c  10.0 on 3/6) CBG stable SSI  Recent Labs    01/28/24 1118 01/28/24 1654 01/28/24 2124  GLUCAP 138* 106* 96  Code status:   Code Status: Full Code   DVT Prophylaxis: heparin  injection 5,000 Units Start: 12/15/23 1400   Family Communication: None at bedside    Disposition Plan: Status is: Inpatient Remains inpatient appropriate because: Severe of illness   Planned Discharge Destination:Home health   Diet: Diet Order             Diet regular Room service appropriate? Yes with Assist; Fluid consistency: Thin; Fluid restriction: 1200 mL Fluid  Diet effective now                   I have personally reviewed following labs and imaging studies  LABORATORY DATA:   Data Review:   Inpatient Medications  Scheduled Meds:  (feeding supplement) PROSource Plus  30 mL Oral Daily   acetaminophen   1,000 mg Oral TID   artificial tears   Right Eye QID   bictegravir-emtricitabine -tenofovir  AF  1 tablet Oral Daily   Chlorhexidine  Gluconate Cloth  6 each Topical Q0600   darbepoetin (ARANESP ) injection - DIALYSIS  150 mcg Subcutaneous Q Fri-1800   feeding supplement (NEPRO CARB STEADY)  237 mL Oral BID AC & HS   fentaNYL   1 patch Transdermal Q72H   gabapentin   200 mg Oral TID   gatifloxacin   1 drop Right Eye BID   heparin   5,000 Units Subcutaneous Q8H   insulin  aspart  0-5 Units Subcutaneous QHS   insulin  aspart  0-6 Units Subcutaneous TID WC   lidocaine   2 patch Transdermal Q24H   methocarbamol   500 mg Oral TID   midodrine   10 mg Oral TID WC   midodrine   10 mg Oral Q T,Th,Sa-HD   multivitamin  1 tablet Oral QHS   pantoprazole   40 mg Oral BID AC   polyethylene glycol  17 g Oral Daily   sevelamer  carbonate  800 mg Oral TID WC   Continuous Infusions:  vancomycin  500 mg (01/29/24 1046)   PRN Meds:.fentaNYL  (SUBLIMAZE ) injection, glucagon  (human recombinant), hydrALAZINE , ibuprofen , ipratropium-albuterol , metoprolol  tartrate, ondansetron  (ZOFRAN ) IV, oxyCODONE ,  sodium chloride  flush  DVT Prophylaxis  heparin  injection 5,000 Units Start: 12/15/23 1400     Recent Labs  Lab 01/24/24 0423 01/27/24 1021 01/29/24 0707  WBC 6.7 8.2 6.3  HGB 9.9* 9.5* 9.2*  HCT 30.5* 29.6* 28.8*  PLT 181 193 185  MCV 97.8 99.3 99.7  MCH 31.7 31.9 31.8  MCHC 32.5 32.1 31.9  RDW 18.3* 18.5* 18.4*  LYMPHSABS 1.2  --   --   MONOABS 0.9  --   --   EOSABS 0.0  --   --   BASOSABS 0.1  --   --     Recent Labs  Lab 01/24/24 0423 01/27/24 1021 01/28/24 0400 01/29/24 0707  NA 131* 131*  --  132*  K 4.0 4.7  --  4.8  CL 94* 93*  --  92*  CO2 24 24  --  22  ANIONGAP 13 14  --  18*  GLUCOSE 146* 142*  --  95  BUN 23* 36*  --  31*  CREATININE 3.88* 4.99*  --  4.54*  ALBUMIN  2.2* 2.2*  --  2.1*  HGBA1C  --   --  5.9*  --   PHOS 3.2 3.5  --  4.4  CALCIUM  9.3 9.6  --  9.5      Recent Labs  Lab 01/24/24 0423 01/27/24 1021 01/28/24 0400 01/29/24 0707  HGBA1C  --   --  5.9*  --   CALCIUM   9.3 9.6  --  9.5    --------------------------------------------------------------------------------------------------------------- Lab Results  Component Value Date   CHOL 166 04/16/2023   HDL 40 04/16/2023   LDLCALC 90 04/16/2023   TRIG 277 (H) 04/16/2023   CHOLHDL 4.2 04/16/2023    Lab Results  Component Value Date   HGBA1C 5.9 (H) 01/28/2024      Signature  -   Donalda Applebaum M.D on 01/29/2024 at 11:36 AM   -  To page go to [email or web address]

## 2024-01-29 NOTE — Progress Notes (Signed)
 [facility] Progress Note   Subjective:  Seen on HD - 1L UFG planned, giving IV albumin  today to see if can mobilize fluid - facial edema still present. Denies CP/dyspnea.  Objective Vitals:   01/29/24 0852 01/29/24 0900 01/29/24 0930 01/29/24 1000  BP: 115/79 95/67 (!) 82/61 (!) 81/56  Pulse: 77 77 77 73  Resp: (!) 8 (!) 8 (!) 6 (!) 7  Temp:      TempSrc:      SpO2: 100% 100% 100% 100%  Weight:      Height:       Physical Exam General: Chronically ill man, NAD. Room air. + supraorbital edema Heart: RRR Lungs: CTAB Abdomen: soft Extremities: B BKA, no LE edema Dialysis Access: [facility]  Additional Objective Labs: Basic Metabolic Panel: Recent Labs  Lab 01/24/24 0423 01/27/24 1021  NA 131* 131*  K 4.0 4.7  CL 94* 93*  CO2 24 24  GLUCOSE 146* 142*  BUN 23* 36*  CREATININE 3.88* 4.99*  CALCIUM  9.3 9.6  PHOS 3.2 3.5   Liver Function Tests: Recent Labs  Lab 01/24/24 0423 01/27/24 1021  ALBUMIN  2.2* 2.2*   CBC: Recent Labs  Lab 01/24/24 0423 01/27/24 1021 01/29/24 0707  WBC 6.7 8.2 6.3  NEUTROABS 4.5  --   --   HGB 9.9* 9.5* 9.2*  HCT 30.5* 29.6* 28.8*  MCV 97.8 99.3 99.7  PLT 181 193 185   Blood Culture    Component Value Date/Time   SDES NEEDLE ASPIRATE 01/07/2024 1612   SPECREQUEST DISC 01/07/2024 1612   CULT  01/07/2024 1612    FEW METHICILLIN RESISTANT STAPHYLOCOCCUS AUREUS NO ANAEROBES ISOLATED Performed at [facility], [address], [place], [place] [address]    REPTSTATUS 01/12/2024 FINAL 01/07/2024 1612   CBG: Recent Labs  Lab 01/27/24 1558 01/27/24 2134 01/28/24 1118 01/28/24 1654 01/28/24 2124  GLUCAP 84 130* 138* 106* 96   Medications:  vancomycin  Stopped (01/27/24 1951)    (feeding supplement) PROSource Plus  30 mL Oral Daily   acetaminophen   1,000 mg Oral TID   artificial tears   Right Eye QID   bictegravir-emtricitabine -tenofovir  AF  1 tablet Oral Daily   Chlorhexidine  Gluconate Cloth  6 each  Topical Q0600   darbepoetin (ARANESP ) injection - DIALYSIS  150 mcg Subcutaneous Q Fri-1800   feeding supplement (NEPRO CARB STEADY)  237 mL Oral BID AC & HS   fentaNYL   1 patch Transdermal Q72H   gabapentin   200 mg Oral TID   gatifloxacin   1 drop Right Eye BID   heparin   5,000 Units Subcutaneous Q8H   insulin  aspart  0-5 Units Subcutaneous QHS   insulin  aspart  0-6 Units Subcutaneous TID WC   lidocaine   2 patch Transdermal Q24H   methocarbamol   500 mg Oral TID   midodrine   10 mg Oral TID WC   midodrine   10 mg Oral Q T,Th,Sa-HD   multivitamin  1 tablet Oral QHS   pantoprazole   40 mg Oral BID AC   polyethylene glycol  17 g Oral Daily   sevelamer  carbonate  800 mg Oral TID WC    Dialysis Orders [facility] - MWF 3.5h  B400  45.5kg  TDC  Heparin  2000 Mircera 100 mcg - last dose 12/01/23 Calcitriol  1.0 mcg (on hold due to high Ca) Uses midodrine  10mg  TID + extra pre-HD    Assessment/Plan: L4-5 discitis/osteomyelitis/psoas abscess: Vanc/Cefepime  through 8/28, now Vanc 500mg  q HD alone until 02/18/24 per ID. S/p IR aspiration  8/27 - growing MRSA. Not a surgical candidate. Pain management per primary.   ESRD: Historically MWF schedule - on TTS this admit, insistent to stay on that schedule, HD now. BP/volume: Chronic hypotension on mido 10mg  TID and extra pre-HD. Below EDW and will need to be lowered at dc. +periorbital edema, UF as tolerated. Anemia of ESRD: Hgb 9.2- continue Aranesp  150mcg q Friday. No IV iron  d/t infection. HIV: controlled on Biktarvy  BMD: CorrCa high, VDRA on hold. Phos on goal, on sevelamer  as binder.  Nutrition: Alb low, continue supps and diet has been liberalized. Debility: PT/OT as tolerated. Dispo: Very difficult placement. Noted patient is uninsured thus SNF is not an option.  No family or safe dispo plan at this time.   Izetta Boehringer, PA-C 01/29/2024, 10:38 AM  [employer]

## 2024-01-30 LAB — GLUCOSE, CAPILLARY
Glucose-Capillary: 108 mg/dL — ABNORMAL HIGH (ref 70–99)
Glucose-Capillary: 139 mg/dL — ABNORMAL HIGH (ref 70–99)
Glucose-Capillary: 167 mg/dL — ABNORMAL HIGH (ref 70–99)
Glucose-Capillary: 177 mg/dL — ABNORMAL HIGH (ref 70–99)

## 2024-01-30 NOTE — Progress Notes (Signed)
 [facility] Progress Note   Subjective:  Seen in room. Ordering lunch. Endorses continued back pain. Tolerated 1L UF with HD yesterday.   Objective Vitals:   01/29/24 1621 01/29/24 2300 01/30/24 0413 01/30/24 0734  BP: 130/77 (!) 141/78 123/80 113/73  Pulse:  81 80   Resp:  14 14   Temp: 98.5 F (36.9 C) 97.8 F (36.6 C) 98.7 F (37.1 C)   TempSrc: Oral Oral Axillary   SpO2:   99%   Weight:      Height:       Physical Exam General: Chronically ill man, NAD. Room air. + supraorbital edema Heart: RRR Lungs: CTAB Abdomen: soft Extremities: B BKA, no LE edema Dialysis Access: [facility]  Additional Objective Labs: Basic Metabolic Panel: Recent Labs  Lab 01/24/24 0423 01/27/24 1021 01/29/24 0707  NA 131* 131* 132*  K 4.0 4.7 4.8  CL 94* 93* 92*  CO2 24 24 22   GLUCOSE 146* 142* 95  BUN 23* 36* 31*  CREATININE 3.88* 4.99* 4.54*  CALCIUM  9.3 9.6 9.5  PHOS 3.2 3.5 4.4   Liver Function Tests: Recent Labs  Lab 01/24/24 0423 01/27/24 1021 01/29/24 0707  ALBUMIN  2.2* 2.2* 2.1*   CBC: Recent Labs  Lab 01/24/24 0423 01/27/24 1021 01/29/24 0707  WBC 6.7 8.2 6.3  NEUTROABS 4.5  --   --   HGB 9.9* 9.5* 9.2*  HCT 30.5* 29.6* 28.8*  MCV 97.8 99.3 99.7  PLT 181 193 185   Blood Culture    Component Value Date/Time   SDES NEEDLE ASPIRATE 01/07/2024 1612   SPECREQUEST DISC 01/07/2024 1612   CULT  01/07/2024 1612    FEW METHICILLIN RESISTANT STAPHYLOCOCCUS AUREUS NO ANAEROBES ISOLATED Performed at [facility], [address], [place], [place] [address]    REPTSTATUS 01/12/2024 FINAL 01/07/2024 1612   CBG: Recent Labs  Lab 01/29/24 1246 01/29/24 1624 01/29/24 2125 01/30/24 0827 01/30/24 1155  GLUCAP 78 72 90 108* 139*   Medications:  vancomycin  Stopped (01/29/24 1306)    (feeding supplement) PROSource Plus  30 mL Oral Daily   acetaminophen   1,000 mg Oral TID   artificial tears   Right Eye QID   bictegravir-emtricitabine -tenofovir   AF  1 tablet Oral Daily   Chlorhexidine  Gluconate Cloth  6 each Topical Q0600   darbepoetin (ARANESP ) injection - DIALYSIS  150 mcg Subcutaneous Q Fri-1800   feeding supplement (NEPRO CARB STEADY)  237 mL Oral BID AC & HS   fentaNYL   1 patch Transdermal Q72H   gabapentin   200 mg Oral TID   gatifloxacin   1 drop Right Eye BID   heparin   5,000 Units Subcutaneous Q8H   insulin  aspart  0-5 Units Subcutaneous QHS   insulin  aspart  0-6 Units Subcutaneous TID WC   lidocaine   2 patch Transdermal Q24H   methocarbamol   500 mg Oral TID   midodrine   10 mg Oral TID WC   midodrine   10 mg Oral Q T,Th,Sa-HD   multivitamin  1 tablet Oral QHS   pantoprazole   40 mg Oral BID AC   polyethylene glycol  17 g Oral Daily   sevelamer  carbonate  800 mg Oral TID WC    Dialysis Orders [facility] - MWF 3.5h  B400  45.5kg  TDC  Heparin  2000 Mircera 100 mcg - last dose 12/01/23 Calcitriol  1.0 mcg (on hold due to high Ca) Uses midodrine  10mg  TID + extra pre-HD    Assessment/Plan: L4-5 discitis/osteomyelitis/psoas abscess: Vanc/Cefepime  through 8/28, now Vanc 500mg   q HD alone until 02/18/24 per ID. S/p IR aspiration 8/27 - growing MRSA. Not a surgical candidate. Pain management per primary.   ESRD: Historically MWF schedule - on TTS this admit, insistent to stay on that schedule, HD now. BP/volume: Chronic hypotension on mido 10mg  TID and extra pre-HD. Below EDW and will need to be lowered at dc. +periorbital edema, UF as tolerated. Anemia of ESRD: Hgb 9.2- continue Aranesp  150mcg q Friday. No IV iron  d/t infection. HIV: controlled on Biktarvy  BMD: CorrCa high, VDRA on hold. Phos on goal, on sevelamer  as binder.  Nutrition: Alb low, continue supps and diet has been liberalized. Debility: PT/OT as tolerated. Dispo: Very difficult placement. Noted patient is uninsured thus SNF is not an option.  No family or safe dispo plan at this time.  Maisie Ronnald Acosta PA-C [facility] 01/30/2024,12:35 PM

## 2024-01-30 NOTE — Progress Notes (Signed)
 PROGRESS NOTE        PATIENT DETAILS Name: Alvin Daniels Age: 53 y.o. Sex: male Date of Birth: [birth year] Admit Date: 12/14/2023 Admitting Physician Marsha Ada, MD PCP:Pcp, No  Brief Summary: Patient is a 53 y.o.  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  Significant events: 8/4>> admit to [facility]  Significant studies: 6/4>> TEE: EF 60-65%, 1.8 x 1.2 mass right atrial wall-?vegetation from old HD catheter impinged on RA wall-versus mass/tumor. 8/4>> MRI pelvis: Discitis/osteomyelitis L4-5-right psoas abscess. 8/5>> CT abdomen/pelvis: Discitis/osteomyelitis L4-5 with paraspinal inflammatory changes/right psoas abscess.  8/8>> MRI LS spine: L4-5 discitis/osteomyelitis with associated paraspinal inflammatory changes, right psoas abscess. 8/20>> MRI left hip: Discitis of the lower lumbar spine with extension into the psoas muscle. 8/20>> MRI left knee: BKA-no significant abnormality. 8/25>> MRI LS spine: Persistent/ongoing changes of osteomyelitis at L4/L5 with associated epidural phlegmon/abscess within the ventral epidural space at L4/L5.  Associated multiloculated bilateral psoas abscess measuring up to 3.1 cm on the left.  Significant microbiology data: 5/28>> blood culture: MRSA 5/29>> left finger abscess: E. coli/Streptococcus anginosus, Bacteroides fragilis. 8/4>> blood culture: No growth 8/27 >> L4-L5 disc aspirate culture: MRSA.    Procedures: 8/27 >> IR aspiration 8/27 of of L4-L5 disc aspirate  Consults: ID Nephrology Ophthalmology Interventional radiology  Subjective:   Objective: Vitals: Blood pressure 123/80, pulse 80, temperature 98.7 F (37.1 C), temperature  source Axillary, resp. rate 14, height 4' 11 (1.499 m), weight 38.5 kg, SpO2 99%.   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 coli on bone culture) Continues to have left-sided hip pain-unchanged for the past several weeks On IV vancomycin -per last ID note from Dr. Vu-6 weeks from 8/27-EOT around 10/8. Per prior notes-not a surgical candidate-IR unable to drain abscess due to lack of safe window Will need reimaging at some point-perhaps closer to antibiotic EOT.  Syncope on 8/20 Probably related to soft blood pressure/orthostatic hypotension Telemetry monitoring unremarkable.  Chronic hypotension On midodrine  Narcotics for back pain very not helping. Minimize narcotics as much as possible-avoid further escalation in narcotic dosing.  ESRD on HD MWF Nephrology following.  Normocytic anemia Secondary to ESRD Hb stable Aranesp /iron  defer to nephrology service Follow  PAD-s/p bilateral BKA Normally uses prosthesis Due to severe pain-he has been unable to ambulate with a prosthesis.  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  Difficult disposition No family-uninsured-SNF not a option per SW-history of ESRD with bilateral BKA-currently unable to ambulate with his prostheses-hence unable to be discharged home safely.   Patient has been refusing PT/OT-multiple times-has resolved-PT/OT have signed off. Normally-prior to this  hospitalization was able to ambulate with prosthesis and catch the bus/scat transport to go back and forth from dialysis center.   DM-2 (A1c 10.0 on 3/6) CBG  stable SSI  Recent Labs    01/29/24 1624 01/29/24 2125 01/30/24 0827  GLUCAP 72 90 108*        Code status:   Code Status: Full Code   DVT Prophylaxis: heparin  injection 5,000 Units Start: 12/15/23 1400   Family Communication: None at bedside    Disposition Plan: Status is: Inpatient Remains inpatient appropriate because: Severe of illness   Planned Discharge Destination:Home health   Diet: Diet Order             Diet regular Room service appropriate? Yes with Assist; Fluid consistency: Thin; Fluid restriction: 1200 mL Fluid  Diet effective now                   I have personally reviewed following labs and imaging studies  LABORATORY DATA:   Data Review:   Inpatient Medications  Scheduled Meds:  (feeding supplement) PROSource Plus  30 mL Oral Daily   acetaminophen   1,000 mg Oral TID   artificial tears   Right Eye QID   bictegravir-emtricitabine -tenofovir  AF  1 tablet Oral Daily   Chlorhexidine  Gluconate Cloth  6 each Topical Q0600   darbepoetin (ARANESP ) injection - DIALYSIS  150 mcg Subcutaneous Q Fri-1800   feeding supplement (NEPRO CARB STEADY)  237 mL Oral BID AC & HS   fentaNYL   1 patch Transdermal Q72H   gabapentin   200 mg Oral TID   gatifloxacin   1 drop Right Eye BID   heparin   5,000 Units Subcutaneous Q8H   insulin  aspart  0-5 Units Subcutaneous QHS   insulin  aspart  0-6 Units Subcutaneous TID WC   lidocaine   2 patch Transdermal Q24H   methocarbamol   500 mg Oral TID   midodrine   10 mg Oral TID WC   midodrine   10 mg Oral Q T,Th,Sa-HD   multivitamin  1 tablet Oral QHS   pantoprazole   40 mg Oral BID AC   polyethylene glycol  17 g Oral Daily   sevelamer  carbonate  800 mg Oral TID WC   Continuous Infusions:  vancomycin  Stopped (01/29/24 1306)   PRN Meds:.fentaNYL  (SUBLIMAZE )  injection, glucagon  (human recombinant), hydrALAZINE , ibuprofen , ipratropium-albuterol , metoprolol  tartrate, ondansetron  (ZOFRAN ) IV, oxyCODONE , sodium chloride  flush  DVT Prophylaxis  heparin  injection 5,000 Units Start: 12/15/23 1400     Recent Labs  Lab 01/24/24 0423 01/27/24 1021 01/29/24 0707  WBC 6.7 8.2 6.3  HGB 9.9* 9.5* 9.2*  HCT 30.5* 29.6* 28.8*  PLT 181 193 185  MCV 97.8 99.3 99.7  MCH 31.7 31.9 31.8  MCHC 32.5 32.1 31.9  RDW 18.3* 18.5* 18.4*  LYMPHSABS 1.2  --   --   MONOABS 0.9  --   --   EOSABS 0.0  --   --   BASOSABS 0.1  --   --     Recent Labs  Lab 01/24/24 0423 01/27/24 1021 01/28/24 0400 01/29/24 0707  NA 131* 131*  --  132*  K 4.0 4.7  --  4.8  CL 94* 93*  --  92*  CO2 24 24  --  22  ANIONGAP 13 14  --  18*  GLUCOSE 146* 142*  --  95  BUN 23* 36*  --  31*  CREATININE 3.88* 4.99*  --  4.54*  ALBUMIN  2.2* 2.2*  --  2.1*  HGBA1C  --   --  5.9*  --   PHOS 3.2 3.5  --  4.4  CALCIUM  9.3 9.6  --  9.5      Recent  Labs  Lab 01/24/24 0423 01/27/24 1021 01/28/24 0400 01/29/24 0707  HGBA1C  --   --  5.9*  --   CALCIUM  9.3 9.6  --  9.5    --------------------------------------------------------------------------------------------------------------- Lab Results  Component Value Date   CHOL 166 04/16/2023   HDL 40 04/16/2023   LDLCALC 90 04/16/2023   TRIG 277 (H) 04/16/2023   CHOLHDL 4.2 04/16/2023    Lab Results  Component Value Date   HGBA1C 5.9 (H) 01/28/2024      Signature  -   Donalda Applebaum M.D on 01/30/2024 at 10:14 AM   -  To page go to [email or web address]

## 2024-01-31 LAB — GLUCOSE, CAPILLARY
Glucose-Capillary: 157 mg/dL — ABNORMAL HIGH (ref 70–99)
Glucose-Capillary: 79 mg/dL (ref 70–99)
Glucose-Capillary: 127 mg/dL — ABNORMAL HIGH (ref 70–99)

## 2024-01-31 MED ORDER — ALBUMIN HUMAN 25 % IV SOLN
INTRAVENOUS | Status: AC
  Filled 2024-01-31: qty 100

## 2024-01-31 MED ORDER — ANTICOAGULANT SODIUM CITRATE 4% (200MG/5ML) IV SOLN: 5.0000 mL | Status: DC | PRN

## 2024-01-31 MED ORDER — MIDODRINE HCL 5 MG PO TABS
ORAL_TABLET | ORAL | Status: AC
  Filled 2024-01-31: qty 1

## 2024-01-31 MED ORDER — HEPARIN SODIUM (PORCINE) 1000 UNIT/ML DIALYSIS: 2000.0000 [IU] | INTRAMUSCULAR | Status: DC | PRN

## 2024-01-31 MED ORDER — FENTANYL CITRATE PF 50 MCG/ML IJ SOSY
PREFILLED_SYRINGE | INTRAMUSCULAR | Status: AC
  Filled 2024-01-31: qty 1

## 2024-01-31 MED ORDER — ALTEPLASE 2 MG IJ SOLR: 2.0000 mg | Freq: Once | INTRAMUSCULAR | Status: DC | PRN

## 2024-01-31 MED ORDER — ALBUMIN HUMAN 25 % IV SOLN
25.0000 g | Freq: Once | INTRAVENOUS | Status: AC
  Administered 2024-01-31: 25 g via INTRAVENOUS

## 2024-01-31 MED ORDER — HEPARIN SODIUM (PORCINE) 1000 UNIT/ML DIALYSIS
1000.0000 [IU] | INTRAMUSCULAR | Status: DC | PRN
  Administered 2024-01-31: 1000 [IU]
  Filled 2024-01-31: qty 1

## 2024-01-31 MED ORDER — ACETAMINOPHEN 325 MG PO TABS
ORAL_TABLET | ORAL | Status: AC
  Filled 2024-01-31: qty 3

## 2024-01-31 MED ORDER — LIDOCAINE-PRILOCAINE 2.5-2.5 % EX CREA: 1.0000 | TOPICAL_CREAM | CUTANEOUS | Status: DC | PRN

## 2024-01-31 MED ORDER — PENTAFLUOROPROP-TETRAFLUOROETH EX AERO: 1.0000 | INHALATION_SPRAY | CUTANEOUS | Status: DC | PRN

## 2024-01-31 MED ORDER — MIDODRINE HCL 5 MG PO TABS
ORAL_TABLET | ORAL | Status: AC
Start: 2024-01-31 — End: 2024-01-31
  Filled 2024-01-31: qty 2

## 2024-01-31 MED ORDER — ALBUMIN HUMAN 25 % IV SOLN: 12.5000 g | Freq: Once | INTRAVENOUS | Status: DC

## 2024-01-31 MED ORDER — LIDOCAINE HCL (PF) 1 % IJ SOLN: 5.0000 mL | INTRAMUSCULAR | Status: DC | PRN

## 2024-01-31 NOTE — Progress Notes (Signed)
 PROGRESS NOTE        PATIENT DETAILS Name: Alvin Daniels Age: 53 y.o. Sex: male Date of Birth: [birth year] Admit Date: 12/14/2023 Admitting Physician Marsha Ada, MD PCP:Pcp, No  Brief Summary: Patient is a 53 y.o.  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  Significant events: 8/4>> admit to [facility]  Significant studies: 6/4>> TEE: EF 60-65%, 1.8 x 1.2 mass right atrial wall-?vegetation from old HD catheter impinged on RA wall-versus mass/tumor. 8/4>> MRI pelvis: Discitis/osteomyelitis L4-5-right psoas abscess. 8/5>> CT abdomen/pelvis: Discitis/osteomyelitis L4-5 with paraspinal inflammatory changes/right psoas abscess.  8/8>> MRI LS spine: L4-5 discitis/osteomyelitis with associated paraspinal inflammatory changes, right psoas abscess. 8/20>> MRI left hip: Discitis of the lower lumbar spine with extension into the psoas muscle. 8/20>> MRI left knee: BKA-no significant abnormality. 8/25>> MRI LS spine: Persistent/ongoing changes of osteomyelitis at L4/L5 with associated epidural phlegmon/abscess within the ventral epidural space at L4/L5.  Associated multiloculated bilateral psoas abscess measuring up to 3.1 cm on the left.  Significant microbiology data: 5/28>> blood culture: MRSA 5/29>> left finger abscess: E. coli/Streptococcus anginosus, Bacteroides fragilis. 8/4>> blood culture: No growth 8/27 >> L4-L5 disc aspirate culture: MRSA.    Procedures: 8/27 >> IR aspiration 8/27 of of L4-L5 disc aspirate  Consults: ID Nephrology Ophthalmology Interventional radiology  Subjective: No major issues overnight-lying comfortably in bed.  Pain is  well-controlled.  Objective: Vitals: Blood pressure 119/78, pulse 80, temperature 97.8 F (36.6 C), temperature source Oral, resp. rate 16, height 4' 11 (1.499 m), weight 38.5 kg, SpO2 99%.   Exam: Awake/alert Appears comfortable Abdomen: Soft-some superficial ecchymosis in the anterior abdominal wall Extremities: Bilateral BKA Nonfocal exam.  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 coli on bone culture) Continues to have left-sided hip pain-unchanged for the past several weeks Per prior notes-not a surgical candidate IR was able to aspirate from L4-L5-positive for MRSA. On IV vancomycin -per last ID note from Dr. Vu-6 weeks from 8/27-EOT around 10/8. Will need reimaging at some point-perhaps closer to antibiotic EOT.  Syncope on 8/20 Probably related to soft blood pressure/orthostatic hypotension Telemetry monitoring unremarkable.  Chronic hypotension On midodrine  Narcotics for back pain very not helping. Minimize narcotics as much as possible-avoid further escalation in narcotic dosing.  ESRD on HD MWF Nephrology following.  Normocytic anemia Secondary to ESRD Hb stable Aranesp /iron  defer to nephrology service Follow  PAD-s/p bilateral BKA Normally uses prosthesis Due to severe pain-he has been unable to ambulate with a prosthesis. Note-has been refusing physical therapy-multiple times-they have signed off.  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  Difficult  disposition No family-uninsured-SNF not a option per SW-history of ESRD with bilateral BKA-currently unable to ambulate with his prostheses-hence unable to be discharged home safely.   Patient has been refusing PT/OT-multiple times-has resolved-PT/OT have signed off. Normally-prior to  this hospitalization was able to ambulate with prosthesis and catch the bus/scat transport to go back and forth from dialysis center.   DM-2 (A1c 10.0 on 3/6) CBG stable SSI  Recent Labs    01/30/24 1556 01/30/24 2344 01/31/24 0743  GLUCAP 167* 177* 157*        Code status:   Code Status: Full Code   DVT Prophylaxis: heparin  injection 5,000 Units Start: 12/15/23 1400   Family Communication: None at bedside    Disposition Plan: Status is: Inpatient Remains inpatient appropriate because: Severe of illness   Planned Discharge Destination:Home health   Diet: Diet Order             Diet regular Room service appropriate? Yes with Assist; Fluid consistency: Thin; Fluid restriction: 1200 mL Fluid  Diet effective now                   I have personally reviewed following labs and imaging studies  LABORATORY DATA:   Data Review:   Inpatient Medications  Scheduled Meds:  (feeding supplement) PROSource Plus  30 mL Oral Daily   acetaminophen   1,000 mg Oral TID   artificial tears   Right Eye QID   bictegravir-emtricitabine -tenofovir  AF  1 tablet Oral Daily   Chlorhexidine  Gluconate Cloth  6 each Topical Q0600   darbepoetin (ARANESP ) injection - DIALYSIS  150 mcg Subcutaneous Q Fri-1800   feeding supplement (NEPRO CARB STEADY)  237 mL Oral BID AC & HS   fentaNYL   1 patch Transdermal Q72H   gabapentin   200 mg Oral TID   gatifloxacin   1 drop Right Eye BID   heparin   5,000 Units Subcutaneous Q8H   insulin  aspart  0-5 Units Subcutaneous QHS   insulin  aspart  0-6 Units Subcutaneous TID WC   lidocaine   2 patch Transdermal Q24H   methocarbamol   500 mg Oral TID   midodrine   10 mg Oral  TID WC   midodrine   10 mg Oral Q T,Th,Sa-HD   multivitamin  1 tablet Oral QHS   pantoprazole   40 mg Oral BID AC   polyethylene glycol  17 g Oral Daily   sevelamer  carbonate  800 mg Oral TID WC   Continuous Infusions:  anticoagulant sodium citrate      vancomycin  Stopped (01/29/24 1306)   PRN Meds:.alteplase , anticoagulant sodium citrate , fentaNYL  (SUBLIMAZE ) injection, glucagon  (human recombinant), heparin , heparin , hydrALAZINE , ibuprofen , ipratropium-albuterol , lidocaine  (PF), lidocaine -prilocaine , metoprolol  tartrate, ondansetron  (ZOFRAN ) IV, oxyCODONE , pentafluoroprop-tetrafluoroeth, sodium chloride  flush  DVT Prophylaxis  heparin  injection 5,000 Units Start: 12/15/23 1400     Recent Labs  Lab 01/27/24 1021 01/29/24 0707  WBC 8.2 6.3  HGB 9.5* 9.2*  HCT 29.6* 28.8*  PLT 193 185  MCV 99.3 99.7  MCH 31.9 31.8  MCHC 32.1 31.9  RDW 18.5* 18.4*    Recent Labs  Lab 01/27/24 1021 01/28/24 0400 01/29/24 0707  NA 131*  --  132*  K 4.7  --  4.8  CL 93*  --  92*  CO2 24  --  22  ANIONGAP 14  --  18*  GLUCOSE 142*  --  95  BUN 36*  --  31*  CREATININE 4.99*  --  4.54*  ALBUMIN  2.2*  --  2.1*  HGBA1C  --  5.9*  --   PHOS 3.5  --  4.4  CALCIUM  9.6  --  9.5      Recent Labs  Lab 01/27/24 1021 01/28/24 0400 01/29/24 0707  HGBA1C  --  5.9*  --  CALCIUM  9.6  --  9.5    --------------------------------------------------------------------------------------------------------------- Lab Results  Component Value Date   CHOL 166 04/16/2023   HDL 40 04/16/2023   LDLCALC 90 04/16/2023   TRIG 277 (H) 04/16/2023   CHOLHDL 4.2 04/16/2023    Lab Results  Component Value Date   HGBA1C 5.9 (H) 01/28/2024      Signature  -   Donalda Applebaum M.D on 01/31/2024 at 7:59 AM   -  To page go to [email or web address]

## 2024-01-31 NOTE — Plan of Care (Signed)

## 2024-01-31 NOTE — Progress Notes (Signed)
 [facility] Progress Note   Subjective:   Seen in [facility]. Blood pressures low on dialysis. Attempting 1L UF. Will give midodrine  and albumin . He's asymptomatic. No complaints.   Objective Vitals:   01/31/24 0804 01/31/24 0822 01/31/24 0827 01/31/24 0830  BP: (!) 103/56 112/67 112/67 101/60  Pulse: 92 87 84 80  Resp: 15 15 14 12   Temp: 98.1 F (36.7 C)     TempSrc:      SpO2: 99% 100% 100% 100%  Weight: 40.9 kg     Height:        Additional Objective Labs: Basic Metabolic Panel: Recent Labs  Lab 01/27/24 1021 01/29/24 0707  NA 131* 132*  K 4.7 4.8  CL 93* 92*  CO2 24 22  GLUCOSE 142* 95  BUN 36* 31*  CREATININE 4.99* 4.54*  CALCIUM  9.6 9.5  PHOS 3.5 4.4   Liver Function Tests: Recent Labs  Lab 01/27/24 1021 01/29/24 0707  ALBUMIN  2.2* 2.1*   CBC: Recent Labs  Lab 01/27/24 1021 01/29/24 0707  WBC 8.2 6.3  HGB 9.5* 9.2*  HCT 29.6* 28.8*  MCV 99.3 99.7  PLT 193 185   Blood Culture    Component Value Date/Time   SDES NEEDLE ASPIRATE 01/07/2024 1612   SPECREQUEST DISC 01/07/2024 1612   CULT  01/07/2024 1612    FEW METHICILLIN RESISTANT STAPHYLOCOCCUS AUREUS NO ANAEROBES ISOLATED Performed at [facility], [address] [place], [place] [address]    REPTSTATUS 01/12/2024 FINAL 01/07/2024 1612   CBG: Recent Labs  Lab 01/30/24 0827 01/30/24 1155 01/30/24 1556 01/30/24 2344 01/31/24 0743  GLUCAP 108* 139* 167* 177* 157*   Physical Exam General: Chronically ill man, NAD. Room air. + supraorbital edema Heart: RRR Lungs: CTAB Abdomen: soft Extremities: B BKA, no LE edema Dialysis Access: [facility]  Medications:  anticoagulant sodium citrate      vancomycin  Stopped (01/29/24 1306)    (feeding supplement) PROSource Plus  30 mL Oral Daily   acetaminophen   1,000 mg Oral TID   artificial tears   Right Eye QID   bictegravir-emtricitabine -tenofovir  AF  1 tablet Oral Daily   Chlorhexidine  Gluconate Cloth  6 each Topical Q0600    darbepoetin (ARANESP ) injection - DIALYSIS  150 mcg Subcutaneous Q Fri-1800   feeding supplement (NEPRO CARB STEADY)  237 mL Oral BID AC & HS   fentaNYL   1 patch Transdermal Q72H   gabapentin   200 mg Oral TID   gatifloxacin   1 drop Right Eye BID   heparin   5,000 Units Subcutaneous Q8H   insulin  aspart  0-5 Units Subcutaneous QHS   insulin  aspart  0-6 Units Subcutaneous TID WC   lidocaine   2 patch Transdermal Q24H   methocarbamol   500 mg Oral TID   midodrine   10 mg Oral TID WC   midodrine   10 mg Oral Q T,Th,Sa-HD   multivitamin  1 tablet Oral QHS   pantoprazole   40 mg Oral BID AC   polyethylene glycol  17 g Oral Daily   sevelamer  carbonate  800 mg Oral TID WC    Dialysis Orders [facility] - MWF 3.5h  B400  45.5kg  TDC  Heparin  2000 Mircera 100 mcg - last dose 12/01/23 Calcitriol  1.0 mcg (on hold due to high Ca) Uses midodrine  10mg  TID + extra pre-HD    Assessment/Plan: L4-5 discitis/osteomyelitis/psoas abscess: Vanc/Cefepime  through 8/28, now Vanc 500mg  q HD alone until 02/18/24 per ID. S/p IR aspiration 8/27 - growing MRSA. Not a surgical candidate. Pain management per  primary.   ESRD: Historically MWF schedule - on TTS this admit, insistent to stay on that schedule, HD now. BP/volume: Chronic hypotension on mido 10mg  TID and extra pre-HD. Below EDW and will need to be lowered at dc. +periorbital edema, UF as tolerated. Anemia of ESRD: Hgb 9.2- continue Aranesp  150mcg q Friday. No IV iron  d/t infection. HIV: controlled on Biktarvy  BMD: CorrCa high, VDRA on hold. Phos on goal, on sevelamer  as binder.  Nutrition: Alb low, continue supps and diet has been liberalized. Debility: PT/OT as tolerated. Dispo: Very difficult placement. Noted patient is uninsured thus SNF is not an option.  No family or safe dispo plan at this time.  Maisie Ronnald Acosta PA-C  [facility] 01/31/2024,8:53 AM

## 2024-02-01 LAB — RENAL FUNCTION PANEL
Albumin: 2.4 g/dL — ABNORMAL LOW (ref 3.5–5.0)
Anion gap: 13 (ref 5–15)
BUN: 18 mg/dL (ref 6–20)
CO2: 23 mmol/L (ref 22–32)
Calcium: 9.1 mg/dL (ref 8.9–10.3)
Chloride: 93 mmol/L — ABNORMAL LOW (ref 98–111)
Creatinine, Ser: 2.77 mg/dL — ABNORMAL HIGH (ref 0.61–1.24)
GFR, Estimated: 27 mL/min — ABNORMAL LOW (ref >60)
Glucose, Bld: 163 mg/dL — ABNORMAL HIGH (ref 70–99)
Phosphorus: 2.7 mg/dL (ref 2.5–4.6)
Potassium: 3.5 mmol/L (ref 3.5–5.1)
Sodium: 129 mmol/L — ABNORMAL LOW (ref 135–145)

## 2024-02-01 LAB — CBC
HCT: 29.9 % — ABNORMAL LOW (ref 39.0–52.0)
Hemoglobin: 9.7 g/dL — ABNORMAL LOW (ref 13.0–17.0)
MCH: 31.6 pg (ref 26.0–34.0)
MCHC: 32.4 g/dL (ref 30.0–36.0)
MCV: 97.4 fL (ref 80.0–100.0)
Platelets: 162 K/uL (ref 150–400)
RBC: 3.07 MIL/uL — ABNORMAL LOW (ref 4.22–5.81)
RDW: 18.3 % — ABNORMAL HIGH (ref 11.5–15.5)
WBC: 8 K/uL (ref 4.0–10.5)
nRBC: 0 % (ref 0.0–0.2)

## 2024-02-01 LAB — GLUCOSE, CAPILLARY
Glucose-Capillary: 117 mg/dL — ABNORMAL HIGH (ref 70–99)
Glucose-Capillary: 127 mg/dL — ABNORMAL HIGH (ref 70–99)
Glucose-Capillary: 127 mg/dL — ABNORMAL HIGH (ref 70–99)
Glucose-Capillary: 162 mg/dL — ABNORMAL HIGH (ref 70–99)

## 2024-02-01 NOTE — Progress Notes (Signed)
 PROGRESS NOTE        PATIENT DETAILS Name: Alvin Daniels Age: 53 y.o. Sex: male Date of Birth: 09-15-1970 Admit Date: 12/14/2023 Admitting Physician Marsha Ada, MD PCP:Pcp, No  Brief Summary: Patient is a 53 y.o.  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  Significant events: 8/4>> admit to [facility]  Significant studies: 6/4>> TEE: EF 60-65%, 1.8 x 1.2 mass right atrial wall-?vegetation from old HD catheter impinged on RA wall-versus mass/tumor. 8/4>> MRI pelvis: Discitis/osteomyelitis L4-5-right psoas abscess. 8/5>> CT abdomen/pelvis: Discitis/osteomyelitis L4-5 with paraspinal inflammatory changes/right psoas abscess.  8/8>> MRI LS spine: L4-5 discitis/osteomyelitis with associated paraspinal inflammatory changes, right psoas abscess. 8/20>> MRI left hip: Discitis of the lower lumbar spine with extension into the psoas muscle. 8/20>> MRI left knee: BKA-no significant abnormality. 8/25>> MRI LS spine: Persistent/ongoing changes of osteomyelitis at L4/L5 with associated epidural phlegmon/abscess within the ventral epidural space at L4/L5.  Associated multiloculated bilateral psoas abscess measuring up to 3.1 cm on the left.  Significant microbiology data: 5/28>> blood culture: MRSA 5/29>> left finger abscess: E. coli/Streptococcus anginosus, Bacteroides fragilis. 8/4>> blood culture: No growth 8/27 >> L4-L5 disc aspirate culture: MRSA.    Procedures: 8/27 >> IR aspiration 8/27 of of L4-L5 disc aspirate  Consults: ID Nephrology Ophthalmology Interventional radiology  Subjective: Attempted to use interpreter videophonic, patient ignored interpreter, spoke to me in english or said I  don't understand.  But seems no issues overnight.  Complains of pain at his belt area and in his thighs.    Objective: Vitals: Blood pressure 90/70, pulse 83, temperature 97.6 F (36.4 C), temperature source Oral, resp. rate 18, height 4' 11 (1.499 m), weight 50 kg, SpO2 100%.   Exam: Awake/alert Appears comfortable Abdomen: Soft, no TTP Extremities: Bilateral BKA, ROM at both knees and hips without pain.  No pain to light palpation, no redness or induration.  No pain to palpation along the pelvis or sacrum or low back.  No visiblechanges to skin of low back.   Nonfocal exam.  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 coli on bone culture) Continues to have left-sided hip pain-unchanged for the past several weeks Per prior notes-not a surgical candidate IR was able to aspirate from L4-L5-positive for MRSA. On IV vancomycin -per last ID note from Dr. Vu-6 weeks from 8/27-EOT around 10/8. Will need reimaging at some point-perhaps closer to antibiotic EOT.  Syncope on 8/20 Probably related to soft blood pressure/orthostatic hypotension Telemetry monitoring unremarkable.  Chronic hypotension On midodrine  Narcotics for back pain very not helping. Minimize narcotics as much as possible-avoid further escalation in narcotic dosing.  ESRD on HD MWF Nephrology following.  Normocytic anemia Secondary to ESRD Hb stable Aranesp /iron  defer to nephrology service Follow  PAD-s/p bilateral BKA Normally uses prosthesis Due to severe pain-he has been unable to ambulate with a prosthesis. Note-has been refusing physical therapy-multiple times-they have signed off.  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  Difficult disposition No family-uninsured-SNF not a option per SW-history of ESRD with bilateral BKA-currently unable to ambulate with his prostheses-hence unable to be discharged home safely.   Patient has been refusing PT/OT-multiple times-has resolved-PT/OT have signed off. Normally-prior to this hospitalization was able to ambulate with prosthesis and catch the bus/scat transport to go back and forth from dialysis center.   DM-2 (A1c 10.0 on 3/6) CBG stable SSI  Recent Labs    01/31/24 1445 01/31/24 2121 02/01/24 0857  GLUCAP 79 127* 117*        Code status:   Code Status: Full Code   DVT Prophylaxis: heparin  injection 5,000 Units Start: 12/15/23 1400   Family Communication: None at bedside    Disposition Plan: Status is: Inpatient Remains inpatient appropriate because: Severe of illness   Planned Discharge Destination:Home health   Diet: Diet Order             Diet regular Room service appropriate? Yes with Assist; Fluid consistency: Thin; Fluid restriction: 1200 mL Fluid  Diet effective now                   I have personally reviewed following labs and imaging studies  LABORATORY DATA:   Data Review:   Inpatient Medications  Scheduled Meds:  (feeding supplement) PROSource Plus  30 mL Oral Daily   acetaminophen   1,000 mg Oral TID   artificial tears   Right Eye QID   bictegravir-emtricitabine -tenofovir  AF  1 tablet Oral Daily   Chlorhexidine  Gluconate Cloth  6 each Topical Q0600   darbepoetin (ARANESP ) injection - DIALYSIS  150 mcg Subcutaneous Q Fri-1800   feeding supplement (NEPRO CARB STEADY)  237 mL Oral BID AC & HS   fentaNYL   1 patch Transdermal Q72H   gabapentin   200 mg Oral TID   gatifloxacin   1 drop Right Eye BID    heparin   5,000 Units Subcutaneous Q8H   insulin  aspart  0-5 Units Subcutaneous QHS   insulin  aspart  0-6 Units Subcutaneous TID WC   lidocaine   2 patch Transdermal Q24H   methocarbamol   500 mg Oral TID   midodrine   10 mg Oral TID WC   midodrine   10 mg Oral Q T,Th,Sa-HD   multivitamin  1 tablet Oral QHS   pantoprazole   40 mg Oral BID AC   polyethylene glycol  17 g Oral Daily   sevelamer  carbonate  800 mg Oral TID WC   Continuous Infusions:  vancomycin  500 mg (01/31/24 1844)   PRN Meds:.fentaNYL  (SUBLIMAZE ) injection, glucagon  (human recombinant), hydrALAZINE , ibuprofen , ipratropium-albuterol , metoprolol  tartrate, ondansetron  (ZOFRAN ) IV, oxyCODONE , sodium chloride  flush  DVT Prophylaxis  heparin  injection 5,000 Units Start: 12/15/23 1400     Recent Labs  Lab 01/27/24 1021 01/29/24 0707 02/01/24 0309  WBC 8.2 6.3 8.0  HGB 9.5* 9.2* 9.7*  HCT 29.6* 28.8* 29.9*  PLT 193 185 162  MCV 99.3 99.7 97.4  MCH 31.9 31.8 31.6  MCHC 32.1 31.9 32.4  RDW 18.5* 18.4* 18.3*    Recent Labs  Lab 01/27/24 1021 01/28/24 0400 01/29/24 0707 02/01/24 0309  NA 131*  --  132* 129*  K 4.7  --  4.8 3.5  CL 93*  --  92* 93*  CO2 24  --  22 23  ANIONGAP 14  --  18* 13  GLUCOSE 142*  --  95 163*  BUN 36*  --  31* 18  CREATININE 4.99*  --  4.54* 2.77*  ALBUMIN  2.2*  --  2.1* 2.4*  HGBA1C  --  5.9*  --   --   PHOS 3.5  --  4.4 2.7  CALCIUM  9.6  --  9.5 9.1      Recent Labs  Lab 01/27/24 1021 01/28/24 0400 01/29/24 0707 02/01/24 0309  HGBA1C  --  5.9*  --   --   CALCIUM  9.6  --  9.5 9.1    --------------------------------------------------------------------------------------------------------------- Lab Results  Component Value Date   CHOL 166 04/16/2023   HDL 40 04/16/2023   LDLCALC 90 04/16/2023   TRIG 277 (H) 04/16/2023   CHOLHDL 4.2 04/16/2023    Lab Results  Component Value Date   HGBA1C 5.9 (H) 01/28/2024      Signature  -   Lonni SHAUNNA Dalton M.D on  02/01/2024 at 11:16 AM   -  To page go to [email or web address]

## 2024-02-01 NOTE — Progress Notes (Signed)
 [facility] Progress Note   Subjective:   Seen in room. No new events. Pain better when he lies still. Completed dialysis yesterday - minimal UF tolerated due to hypotension.   Objective Vitals:   01/31/24 2042 01/31/24 2342 02/01/24 0536 02/01/24 1136  BP: 107/78 (!) 144/89 90/70 107/67  Pulse: 86 76 83 83  Resp:   18 18  Temp: 98 F (36.7 C) 97.8 F (36.6 C) 97.6 F (36.4 C) 98 F (36.7 C)  TempSrc: Oral Oral Oral   SpO2:    100%  Weight:      Height:        Additional Objective Labs: Basic Metabolic Panel: Recent Labs  Lab 01/27/24 1021 01/29/24 0707 02/01/24 0309  NA 131* 132* 129*  K 4.7 4.8 3.5  CL 93* 92* 93*  CO2 24 22 23   GLUCOSE 142* 95 163*  BUN 36* 31* 18  CREATININE 4.99* 4.54* 2.77*  CALCIUM  9.6 9.5 9.1  PHOS 3.5 4.4 2.7   Liver Function Tests: Recent Labs  Lab 01/27/24 1021 01/29/24 0707 02/01/24 0309  ALBUMIN  2.2* 2.1* 2.4*   CBC: Recent Labs  Lab 01/27/24 1021 01/29/24 0707 02/01/24 0309  WBC 8.2 6.3 8.0  HGB 9.5* 9.2* 9.7*  HCT 29.6* 28.8* 29.9*  MCV 99.3 99.7 97.4  PLT 193 185 162   Blood Culture    Component Value Date/Time   SDES NEEDLE ASPIRATE 01/07/2024 1612   SPECREQUEST DISC 01/07/2024 1612   CULT  01/07/2024 1612    FEW METHICILLIN RESISTANT STAPHYLOCOCCUS AUREUS NO ANAEROBES ISOLATED Performed at [facility], [address], [place], [place] [address]    REPTSTATUS 01/12/2024 FINAL 01/07/2024 1612   CBG: Recent Labs  Lab 01/31/24 0743 01/31/24 1445 01/31/24 2121 02/01/24 0857 02/01/24 1132  GLUCAP 157* 79 127* 117* 127*   Physical Exam General: Chronically ill man, NAD. Room air. + supraorbital edema Heart: RRR Lungs: CTAB Abdomen: soft Extremities: B BKA, no LE edema Dialysis Access: [facility]  Medications:  vancomycin  500 mg (01/31/24 1844)    (feeding supplement) PROSource Plus  30 mL Oral Daily   acetaminophen   1,000 mg Oral TID   artificial tears   Right Eye QID    bictegravir-emtricitabine -tenofovir  AF  1 tablet Oral Daily   Chlorhexidine  Gluconate Cloth  6 each Topical Q0600   darbepoetin (ARANESP ) injection - DIALYSIS  150 mcg Subcutaneous Q Fri-1800   feeding supplement (NEPRO CARB STEADY)  237 mL Oral BID AC & HS   fentaNYL   1 patch Transdermal Q72H   gabapentin   200 mg Oral TID   gatifloxacin   1 drop Right Eye BID   heparin   5,000 Units Subcutaneous Q8H   insulin  aspart  0-5 Units Subcutaneous QHS   insulin  aspart  0-6 Units Subcutaneous TID WC   lidocaine   2 patch Transdermal Q24H   methocarbamol   500 mg Oral TID   midodrine   10 mg Oral TID WC   midodrine   10 mg Oral Q T,Th,Sa-HD   multivitamin  1 tablet Oral QHS   pantoprazole   40 mg Oral BID AC   polyethylene glycol  17 g Oral Daily   sevelamer  carbonate  800 mg Oral TID WC    Dialysis Orders [facility] - MWF 3.5h  B400  45.5kg  TDC  Heparin  2000 Mircera 100 mcg - last dose 12/01/23 Calcitriol  1.0 mcg (on hold due to high Ca) Uses midodrine  10mg  TID + extra pre-HD    Assessment/Plan: L4-5 discitis/osteomyelitis/psoas abscess: Vanc/Cefepime  through  8/28, now Vanc 500mg  q HD alone until 02/18/24 per ID. S/p IR aspiration 8/27 - growing MRSA. Not a surgical candidate. Pain management per primary.   ESRD: Historically MWF schedule - on TTS this admit, insistent to stay on that schedule, HD now. BP/volume: Chronic hypotension on mido 10mg  TID and extra pre-HD. Below EDW and will need to be lowered at dc. +periorbital edema, UF as tolerated. Anemia of ESRD: Hgb 9.2- continue Aranesp  150mcg q Friday. No IV iron  d/t infection. HIV: controlled on Biktarvy  BMD: CorrCa high, VDRA on hold. Phos on goal, on sevelamer  as binder.  Nutrition: Alb low, continue supps and diet has been liberalized. Debility: PT/OT as tolerated. Dispo: Very difficult placement. Noted patient is uninsured thus SNF is not an option.  No family or safe dispo plan at this time.  Maisie Ronnald Acosta PA-C [facility] 02/01/2024,11:40 AM

## 2024-02-01 NOTE — Plan of Care (Signed)
  Problem: Education: Goal: Knowledge of General Education information will improve Description: Including pain rating scale, medication(s)/side effects and non-pharmacologic comfort measures Outcome: Progressing   Problem: Nutrition: Goal: Adequate nutrition will be maintained Outcome: Progressing   Problem: Coping: Goal: Level of anxiety will decrease Outcome: Progressing   Problem: Safety: Goal: Ability to remain free from injury will improve Outcome: Progressing   Problem: Health Behavior/Discharge Planning: Goal: Ability to manage health-related needs will improve Outcome: Not Progressing   Problem: Activity: Goal: Risk for activity intolerance will decrease Outcome: Not Progressing   Problem: Pain Managment: Goal: General experience of comfort will improve and/or be controlled Outcome: Not Progressing

## 2024-02-01 NOTE — Progress Notes (Signed)
 Charge Nurse ETTER [place]) witnessed oxycodone  10 mg being administered to patient.

## 2024-02-01 NOTE — Progress Notes (Addendum)
 Patient complained about his pain medications not being given on day shift, charge nurse was called, and patient stated that's the time was incorrect. Nurse explained that it was every 6 hours and couldn't give it early. Medication given when due with witness Electronics engineer), present.  Ants were found in the bed, charge nurse informed, patient is pending transfer to another unit.

## 2024-02-02 LAB — GLUCOSE, CAPILLARY
Glucose-Capillary: 130 mg/dL — ABNORMAL HIGH (ref 70–99)
Glucose-Capillary: 140 mg/dL — ABNORMAL HIGH (ref 70–99)
Glucose-Capillary: 116 mg/dL — ABNORMAL HIGH (ref 70–99)
Glucose-Capillary: 97 mg/dL (ref 70–99)
Glucose-Capillary: 96 mg/dL (ref 70–99)

## 2024-02-02 NOTE — Progress Notes (Signed)
 PROGRESS NOTE        PATIENT DETAILS Name: Alvin Daniels Age: 53 y.o. Sex: male Date of Birth: 09/22/1970 Admit Date: 12/14/2023 Admitting Physician Marsha Ada, MD PCP:Pcp, No  Brief Summary: Patient is a 53 y.o.  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  Significant events: 8/4>> admit to [facility]  Significant studies: 6/4>> TEE: EF 60-65%, 1.8 x 1.2 mass right atrial wall-?vegetation from old HD catheter impinged on RA wall-versus mass/tumor. 8/4>> MRI pelvis: Discitis/osteomyelitis L4-5-right psoas abscess. 8/5>> CT abdomen/pelvis: Discitis/osteomyelitis L4-5 with paraspinal inflammatory changes/right psoas abscess.  8/8>> MRI LS spine: L4-5 discitis/osteomyelitis with associated paraspinal inflammatory changes, right psoas abscess. 8/20>> MRI left hip: Discitis of the lower lumbar spine with extension into the psoas muscle. 8/20>> MRI left knee: BKA-no significant abnormality. 8/25>> MRI LS spine: Persistent/ongoing changes of osteomyelitis at L4/L5 with associated epidural phlegmon/abscess within the ventral epidural space at L4/L5.  Associated multiloculated bilateral psoas abscess measuring up to 3.1 cm on the left.  Significant microbiology data: 5/28>> blood culture: MRSA 5/29>> left finger abscess: E. coli/Streptococcus anginosus, Bacteroides fragilis. 8/4>> blood culture: No growth 8/27 >> L4-L5 disc aspirate culture: MRSA.    Procedures: 8/27 >> IR aspiration 8/27 of of L4-L5 disc aspirate  Consults: ID Nephrology Ophthalmology Interventional radiology  Subjective: No complaints.  Feels well.  Still some back pain and leg pain, but appears  comfortable.  Objective: Vitals: Blood pressure 91/74, pulse 76, temperature 97.6 F (36.4 C), temperature source Oral, resp. rate 15, height 4' 11 (1.499 m), weight 50 kg, SpO2 93%.   Exam: Awake/alert Appears comfortable Abdomen: Soft, no TTP Extremities: Bilateral BKA, ROM at both knees and hips without pain.  No pain to light palpation, no redness or induration.  No pain to palpation along the pelvis or sacrum or low back.  No visiblechanges to skin of low back.   Nonfocal exam.  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 coli on bone culture) Per prior notes-not a surgical candidate IR was able to aspirate from L4-L5-positive for MRSA. On IV vancomycin -per last ID note from Dr. Vu-6 weeks from 8/27-EOT around 10/8. Will need reimaging at some point-perhaps closer to antibiotic EOT.  Chronic hypotension On midodrine  Narcotics for back pain very not helping. Minimize narcotics as much as possible-avoid further escalation in narcotic dosing.  ESRD on HD MWF Nephrology following.  Normocytic anemia Secondary to ESRD Hb stable Aranesp /iron  defer to nephrology service  PAD-s/p bilateral BKA Normally uses prosthesis Due to severe pain-he has been refusing to mobilize with PT  Note-has been refusing physical therapy-multiple times-they have signed off.  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  Difficult disposition Lives alone, has friends, unclear if he has family-uninsured-SNF not  a option per SW-history of ESRD with bilateral BKA-currently unable to ambulate with  his prostheses-hence unable to be discharged home safely.   Patient has been refusing PT/OT-multiple times-has resolved-PT/OT have signed off. Normally-prior to this hospitalization was able to ambulate with prosthesis and catch the bus/scat transport to go back and forth from dialysis center.   DM-2 (A1c 10.0 on 3/6) CBG stable SSI  Recent Labs    02/02/24 0835 02/02/24 1312 02/02/24 1612  GLUCAP 140* 116* 97        Code status:   Code Status: Full Code   DVT Prophylaxis: heparin  injection 5,000 Units Start: 12/15/23 1400   Family Communication: None at bedside    Disposition Plan: Status is: Inpatient Remains inpatient appropriate because: Severe of illness   Planned Discharge Destination:Home health   Diet: Diet Order             Diet regular Room service appropriate? Yes with Assist; Fluid consistency: Thin; Fluid restriction: 1200 mL Fluid  Diet effective now                   I have personally reviewed following labs and imaging studies  LABORATORY DATA:   Data Review:   Inpatient Medications  Scheduled Meds:  (feeding supplement) PROSource Plus  30 mL Oral Daily   acetaminophen   1,000 mg Oral TID   artificial tears   Right Eye QID   bictegravir-emtricitabine -tenofovir  AF  1 tablet Oral Daily   Chlorhexidine  Gluconate Cloth  6 each Topical Q0600   darbepoetin (ARANESP ) injection - DIALYSIS  150 mcg Subcutaneous Q Fri-1800   feeding supplement (NEPRO CARB STEADY)  237 mL Oral BID AC & HS   fentaNYL   1 patch Transdermal Q72H   gabapentin   200 mg Oral TID   gatifloxacin   1 drop Right Eye BID   heparin   5,000 Units Subcutaneous Q8H   insulin  aspart  0-5 Units Subcutaneous QHS   insulin  aspart  0-6 Units Subcutaneous TID WC   lidocaine   2 patch Transdermal Q24H   methocarbamol   500 mg Oral TID   midodrine   10 mg Oral TID WC   midodrine   10 mg Oral Q T,Th,Sa-HD    multivitamin  1 tablet Oral QHS   pantoprazole   40 mg Oral BID AC   polyethylene glycol  17 g Oral Daily   sevelamer  carbonate  800 mg Oral TID WC   Continuous Infusions:  vancomycin  500 mg (01/31/24 1844)   PRN Meds:.glucagon  (human recombinant), hydrALAZINE , ibuprofen , ipratropium-albuterol , metoprolol  tartrate, ondansetron  (ZOFRAN ) IV, oxyCODONE , sodium chloride  flush  DVT Prophylaxis  heparin  injection 5,000 Units Start: 12/15/23 1400     Recent Labs  Lab 01/27/24 1021 01/29/24 0707 02/01/24 0309  WBC 8.2 6.3 8.0  HGB 9.5* 9.2* 9.7*  HCT 29.6* 28.8* 29.9*  PLT 193 185 162  MCV 99.3 99.7 97.4  MCH 31.9 31.8 31.6  MCHC 32.1 31.9 32.4  RDW 18.5* 18.4* 18.3*    Recent Labs  Lab 01/27/24 1021 01/28/24 0400 01/29/24 0707 02/01/24 0309  NA 131*  --  132* 129*  K 4.7  --  4.8 3.5  CL 93*  --  92* 93*  CO2 24  --  22 23  ANIONGAP 14  --  18* 13  GLUCOSE 142*  --  95 163*  BUN 36*  --  31* 18  CREATININE 4.99*  --  4.54* 2.77*  ALBUMIN  2.2*  --  2.1* 2.4*  HGBA1C  --  5.9*  --   --   PHOS 3.5  --  4.4 2.7  CALCIUM  9.6  --  9.5 9.1      Recent Labs  Lab 01/27/24 1021 01/28/24 0400 01/29/24 0707 02/01/24 0309  HGBA1C  --  5.9*  --   --   CALCIUM  9.6  --  9.5 9.1    --------------------------------------------------------------------------------------------------------------- Lab Results  Component Value Date   CHOL 166 04/16/2023   HDL 40 04/16/2023   LDLCALC 90 04/16/2023   TRIG 277 (H) 04/16/2023   CHOLHDL 4.2 04/16/2023    Lab Results  Component Value Date   HGBA1C 5.9 (H) 01/28/2024      Signature  -   Lonni SHAUNNA Dalton M.D on 02/02/2024 at 5:43 PM   -  To page go to [email or web address]

## 2024-02-02 NOTE — Progress Notes (Signed)
 Patient ID: Alvin Daniels, male   DOB: 1970/05/18, 53 y.o.   MRN: [id number] [facility] Progress Note   Assessment/ Plan:   1.  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  Duration of antibiotic treatment to end on 02/18/2024.  Unfortunately he is uninsured and unable to be placed at a skilled nursing facility for ongoing management. 2. ESRD: Continue hemodialysis on TTS schedule while in the hospital (previously MWF).  Next dialysis treatment has been ordered for tomorrow and he will continue midodrine  for chronic hypotension. 3. Anemia: Low hemoglobin/hematocrit however appears to be improving with ongoing ESA.  Suspect significant role for malnutrition/inflammation complex with associated infection. 4. CKD-MBD: Calcium  and phosphorus levels currently at goal.  BD RA/calcitriol  on hold due to previous hypercalcemia. 5. Nutrition: Continue renal diet/fluid restriction and ongoing protein supplementation. 6. Hypotension: Chronic, monitor with ongoing midodrine  and efforts at volume management.  Subjective:   Continues to have intermittent back pain with some movements.   Objective:   BP 104/84 (BP Location: Right Arm)   Pulse 79   Temp 97.9 F (36.6 C)   Resp 17   Ht 4' 11 (1.499 m)   Wt 50 kg   SpO2 99%   BMI 22.26 kg/m   Physical Exam: Gen: Chronically ill-appearing man who seems uncomfortable resting in bed CVS: Pulse regular rhythm, normal rate, S1 and S2 normal Resp: Clear to auscultation bilaterally, no rales/rhonchi.  Right IJ TDC in place Abd: Soft, flat, nontender, bowel sounds normal Ext: Status post bilateral below-knee amputations.  Labs: BMET Recent Labs  Lab 01/27/24 1021 01/29/24 0707 02/01/24 0309  NA 131* 132* 129*  K 4.7 4.8 3.5  CL 93* 92* 93*  CO2 24 22 23   GLUCOSE 142* 95 163*  BUN 36* 31* 18  CREATININE 4.99* 4.54* 2.77*  CALCIUM  9.6 9.5  9.1  PHOS 3.5 4.4 2.7   CBC Recent Labs  Lab 01/27/24 1021 01/29/24 0707 02/01/24 0309  WBC 8.2 6.3 8.0  HGB 9.5* 9.2* 9.7*  HCT 29.6* 28.8* 29.9*  MCV 99.3 99.7 97.4  PLT 193 185 162    Medications:     (feeding supplement) PROSource Plus  30 mL Oral Daily   acetaminophen   1,000 mg Oral TID   artificial tears   Right Eye QID   bictegravir-emtricitabine -tenofovir  AF  1 tablet Oral Daily   Chlorhexidine  Gluconate Cloth  6 each Topical Q0600   darbepoetin (ARANESP ) injection - DIALYSIS  150 mcg Subcutaneous Q Fri-1800   feeding supplement (NEPRO CARB STEADY)  237 mL Oral BID AC & HS   fentaNYL   1 patch Transdermal Q72H   gabapentin   200 mg Oral TID   gatifloxacin   1 drop Right Eye BID   heparin   5,000 Units Subcutaneous Q8H   insulin  aspart  0-5 Units Subcutaneous QHS   insulin  aspart  0-6 Units Subcutaneous TID WC   lidocaine   2 patch Transdermal Q24H   methocarbamol   500 mg Oral TID   midodrine   10 mg Oral TID WC   midodrine   10 mg Oral Q T,Th,Sa-HD   multivitamin  1 tablet Oral QHS   pantoprazole   40 mg Oral BID AC   polyethylene glycol  17 g Oral Daily   sevelamer  carbonate  800 mg Oral TID WC   Gordy Blanch, MD 02/02/2024, 10:22 AM

## 2024-02-03 LAB — GLUCOSE, CAPILLARY
Glucose-Capillary: 119 mg/dL — ABNORMAL HIGH (ref 70–99)
Glucose-Capillary: 82 mg/dL (ref 70–99)
Glucose-Capillary: 97 mg/dL (ref 70–99)

## 2024-02-03 LAB — VANCOMYCIN, RANDOM: Vancomycin Rm: 23 ug/mL

## 2024-02-03 MED ORDER — ALBUMIN HUMAN 25 % IV SOLN
12.5000 g | Freq: Once | INTRAVENOUS | Status: AC
  Administered 2024-02-03: 12.5 g via INTRAVENOUS

## 2024-02-03 MED ORDER — HEPARIN SODIUM (PORCINE) 1000 UNIT/ML IJ SOLN
INTRAMUSCULAR | Status: AC
Start: 2024-02-03 — End: 2024-02-03
  Filled 2024-02-03: qty 4

## 2024-02-03 MED ORDER — VANCOMYCIN HCL 500 MG/100ML IV SOLN
INTRAVENOUS | Status: AC
Start: 2024-02-03 — End: 2024-02-03
  Filled 2024-02-03: qty 100

## 2024-02-03 MED ORDER — ALBUMIN HUMAN 25 % IV SOLN
INTRAVENOUS | Status: AC
  Filled 2024-02-03: qty 100

## 2024-02-03 NOTE — Progress Notes (Signed)
 PROGRESS NOTE        PATIENT DETAILS Name: Alvin Daniels Age: 53 y.o. Sex: male Date of Birth: 12-29-1970 Admit Date: 12/14/2023 Admitting Physician Marsha Ada, MD PCP:Pcp, No  Brief Summary: Patient is a 53 y.o.  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  Significant events: 8/4>> admit to [facility]  Significant studies: 6/4>> TEE: EF 60-65%, 1.8 x 1.2 mass right atrial wall-?vegetation from old HD catheter impinged on RA wall-versus mass/tumor. 8/4>> MRI pelvis: Discitis/osteomyelitis L4-5-right psoas abscess. 8/5>> CT abdomen/pelvis: Discitis/osteomyelitis L4-5 with paraspinal inflammatory changes/right psoas abscess.  8/8>> MRI LS spine: L4-5 discitis/osteomyelitis with associated paraspinal inflammatory changes, right psoas abscess. 8/20>> MRI left hip: Discitis of the lower lumbar spine with extension into the psoas muscle. 8/20>> MRI left knee: BKA-no significant abnormality. 8/25>> MRI LS spine: Persistent/ongoing changes of osteomyelitis at L4/L5 with associated epidural phlegmon/abscess within the ventral epidural space at L4/L5.  Associated multiloculated bilateral psoas abscess measuring up to 3.1 cm on the left.  Significant microbiology data: 5/28>> blood culture: MRSA 5/29>> left finger abscess: E. coli/Streptococcus anginosus, Bacteroides fragilis. 8/4>> blood culture: No growth 8/27 >> L4-L5 disc aspirate culture: MRSA.    Procedures: 8/27 >> IR aspiration 8/27 of of L4-L5 disc aspirate  Consults: ID Nephrology Ophthalmology Interventional radiology  Subjective: Patient resting, no acute distress.  No new concerns, no nursing concerns.  Still stable low back  pain.  Objective: Vitals: Blood pressure 107/82, pulse 88, temperature 97.7 F (36.5 C), temperature source Oral, resp. rate 10, height 4' 11 (1.499 m), weight 40.4 kg, SpO2 100%.   Exam: Awake/alert Appears comfortable RRR no murmurs Respiratory rate normal Abdomen: Soft, no TTP Extremities: Bilateral BKA  Nonfocal exam.  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 coli on bone culture) Per prior notes-not a surgical candidate IR was able to aspirate from L4-L5-positive for MRSA on 8/27 On IV vancomycin -per last ID note from Dr. Vu-6 weeks from 8/27-EOT around 10/8. Will need reimaging at some point-closer to antibiotic EOT ~10/1.  Chronic hypotension On midodrine   ESRD on HD MWF Nephrology following.  Normocytic anemia Secondary to ESRD Hb stable Aranesp /iron  defer to nephrology service  PAD-s/p bilateral BKA Normally uses prosthesis Telling some staff that he 'hasn't walked in years' -- Renal navigator seeking collateral information from dialysis center Due to severe pain-he has been refusing to mobilize with PT  Note-has been refusing physical therapy-multiple times-they have signed off.  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  Difficult disposition Lives alone, has friends, unclear if he has family-uninsured-SNF not a option per SW-history of ESRD with bilateral BKA-currently claims unable to ambulate with his prostheses, can't bring prostheses-hence unable to be discharged  home safely.   Patient has been refusing PT/OT-multiple times-has resolved-PT/OT have signed off. Normally-prior to this hospitalization was able  to ambulate with prosthesis and catch the bus/scat transport to go back and forth from dialysis center.   DM-2 (A1c 10.0 on 3/6) CBG stable SSI  Recent Labs    02/02/24 2128 02/03/24 0638 02/03/24 1624  GLUCAP 96 119* 82        Code status:   Code Status: Full Code   DVT Prophylaxis: heparin  injection 5,000 Units Start: 12/15/23 1400   Family Communication: None at bedside    Disposition Plan: Status is: Inpatient Remains inpatient appropriate because: Severe of illness   Planned Discharge Destination:Home health   Diet: Diet Order             Diet regular Room service appropriate? Yes with Assist; Fluid consistency: Thin; Fluid restriction: 1200 mL Fluid  Diet effective now                   I have personally reviewed following labs and imaging studies  LABORATORY DATA:   Data Review:   Inpatient Medications  Scheduled Meds:  (feeding supplement) PROSource Plus  30 mL Oral Daily   acetaminophen   1,000 mg Oral TID   artificial tears   Right Eye QID   bictegravir-emtricitabine -tenofovir  AF  1 tablet Oral Daily   Chlorhexidine  Gluconate Cloth  6 each Topical Q0600   darbepoetin (ARANESP ) injection - DIALYSIS  150 mcg Subcutaneous Q Fri-1800   feeding supplement (NEPRO CARB STEADY)  237 mL Oral BID AC & HS   fentaNYL   1 patch Transdermal Q72H   gabapentin   200 mg Oral TID   gatifloxacin   1 drop Right Eye BID   heparin   5,000 Units Subcutaneous Q8H   insulin  aspart  0-5 Units Subcutaneous QHS   insulin  aspart  0-6 Units Subcutaneous TID WC   lidocaine   2 patch Transdermal Q24H   methocarbamol   500 mg Oral TID   midodrine   10 mg Oral TID WC   midodrine   10 mg Oral Q T,Th,Sa-HD   multivitamin  1 tablet Oral QHS   pantoprazole   40 mg Oral BID AC   polyethylene glycol  17 g Oral Daily   sevelamer  carbonate   800 mg Oral TID WC   Continuous Infusions:  vancomycin  Stopped (02/03/24 1402)   PRN Meds:.glucagon  (human recombinant), hydrALAZINE , ibuprofen , ipratropium-albuterol , metoprolol  tartrate, ondansetron  (ZOFRAN ) IV, oxyCODONE , sodium chloride  flush  DVT Prophylaxis  heparin  injection 5,000 Units Start: 12/15/23 1400     Recent Labs  Lab 01/29/24 0707 02/01/24 0309  WBC 6.3 8.0  HGB 9.2* 9.7*  HCT 28.8* 29.9*  PLT 185 162  MCV 99.7 97.4  MCH 31.8 31.6  MCHC 31.9 32.4  RDW 18.4* 18.3*    Recent Labs  Lab 01/28/24 0400 01/29/24 0707 02/01/24 0309  NA  --  132* 129*  K  --  4.8 3.5  CL  --  92* 93*  CO2  --  22 23  ANIONGAP  --  18* 13  GLUCOSE  --  95 163*  BUN  --  31* 18  CREATININE  --  4.54* 2.77*  ALBUMIN   --  2.1* 2.4*  HGBA1C 5.9*  --   --   PHOS  --  4.4 2.7  CALCIUM   --  9.5 9.1      Recent Labs  Lab 01/28/24 0400 01/29/24 0707 02/01/24 0309  HGBA1C 5.9*  --   --   CALCIUM   --  9.5 9.1    --------------------------------------------------------------------------------------------------------------- Lab Results  Component Value Date   CHOL 166 04/16/2023  HDL 40 04/16/2023   LDLCALC 90 04/16/2023   TRIG 277 (H) 04/16/2023   CHOLHDL 4.2 04/16/2023    Lab Results  Component Value Date   HGBA1C 5.9 (H) 01/28/2024      Signature  -   Lonni SHAUNNA Dalton M.D on 02/03/2024 at 6:44 PM   -  To page go to [email or web address]

## 2024-02-03 NOTE — Progress Notes (Signed)
 Patient left the unit at 0805 for Dialysis.

## 2024-02-03 NOTE — Plan of Care (Signed)

## 2024-02-03 NOTE — Progress Notes (Signed)
   02/03/24 1500  Spiritual Encounters  Type of Visit Initial  Care provided to: Patient;Friend  Referral source Nurse (RN/NT/LPN)  Reason for visit Routine spiritual support  OnCall Visit No    Chaplain responded to consult request for support. Chaplain talked with Alvin Daniels via phone. She stated that chaplain's visit will be beneficial for the patient. His friend was present at the bedside. He interpreted to our conversation. Chaplain was compassionately present in the room. The patient said that he has severe pain. Chaplain informed the nurse. Patient wanted to see a chaplain at 5 pm.  Alvin Daniels will notify the incoming chaplain.   M.Kubra Susanna Alvin Daniels Resident [phone]

## 2024-02-03 NOTE — Progress Notes (Addendum)
 Used interpretive services to talk with the patient and used [place] [phone].  Patient is a double amputee and states he has not walked in years.  DC order states patient can leave when he walks.  No prosthetic device available.  Patient also states is in so much pain all the time. Patient refused PT/OT services. Primary nurse notified.

## 2024-02-03 NOTE — Procedures (Signed)
 Patient seen on Hemodialysis. BP 129/72   Pulse 69   Temp 98.7 F (37.1 C) (Oral)   Resp 12   Ht 4' 11 (1.499 m)   Wt 40.5 kg   SpO2 100%   BMI 18.03 kg/m   QB 350, UF goal Net even (hypotensive with UF) Tolerating treatment without complaints at this time.   Gordy Blanch MD [facility]. Office # [phone] Pager # [phone] 10:24 AM

## 2024-02-03 NOTE — Progress Notes (Signed)
   02/03/24 1752  Spiritual Encounters  Type of Visit Follow up  Care provided to: Patient  Conversation partners present during encounter Nurse  Reason for visit Routine spiritual support  OnCall Visit Yes   Visited patient for follow up. Patient expressed being in extreme pain and wanting the nurse. Time spent with patient limited.  Spoke with nurse who stated she provided meds and will contact the doctor to see what else can be done for the patient.

## 2024-02-04 LAB — GLUCOSE, CAPILLARY
Glucose-Capillary: 82 mg/dL (ref 70–99)
Glucose-Capillary: 80 mg/dL (ref 70–99)
Glucose-Capillary: 80 mg/dL (ref 70–99)
Glucose-Capillary: 133 mg/dL — ABNORMAL HIGH (ref 70–99)

## 2024-02-04 NOTE — Progress Notes (Signed)
 PROGRESS NOTE    Alvin Daniels  FMW:[id number] DOB: 1970/07/23 DOA: 12/14/2023 PCP: Pcp, No   Brief Narrative:  This 53 yrs old male with PMH significant for PAD s/p bilateral BKA, ESRD on HD, HIV, chronic HCV-recent hospitalization from 5/28-6/6 for MRSA bacteremia with left fourth finger osteomyelitis (s/p amputation 5/29)-presented to the hospital with right hip pain. Patient was found to have L4-L5 osteomyelitis with psoas abscess.  Patient was admitted for further evaluation and orthopedics is consulted.   Significant studies: 6/4>> TEE: EF 60-65%, 1.8 x 1.2 mass right atrial wall- vegetation from old HD catheter impinged on RA wall-versus mass/tumor. 8/4>> MRI pelvis: Discitis/osteomyelitis L4-5-right psoas abscess. 8/5>> CT abdomen/pelvis: Discitis/osteomyelitis L4-5 with paraspinal inflammatory changes/right psoas abscess.  8/8>> MRI LS spine: L4-5 discitis/osteomyelitis with associated paraspinal inflammatory changes, right psoas abscess. 8/20>> MRI left hip: Discitis of the lower lumbar spine with extension into the psoas muscle. 8/20>> MRI left knee: BKA-no significant abnormality. 8/25>> MRI LS spine: Persistent/ongoing changes of osteomyelitis at L4/L5 with associated epidural phlegmon/abscess within the ventral epidural space at L4/L5.  Associated multiloculated bilateral psoas abscess measuring up to 3.1 cm on the left.   Significant microbiology data: 5/28>> blood culture: MRSA 5/29>> left finger abscess: E. coli/Streptococcus anginosus, Bacteroides fragilis. 8/4>> blood culture: No growth 8/27 >> L4-L5 disc aspirate culture: MRSA.     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 coli on bone culture) As per prior notes,  Patient is not a surgical candidate. IR was able to aspirate from L4-L5 >positive for MRSA on 01/07/24. As per last ID note from Dr. Overton, recommended 6 weeks of IV vancomycin  from 8/27 -EOT around 02/18/24. Will need reimaging at some point-closer to antibiotic EOT ~10/1.   Chronic hypotension: Continue midodrine    ESRD on HD MWF: Nephrology following. Next  Hemodialysis tomorrow.   Normocytic anemia: Secondary to ESRD Hb stable Continue Aranesp /iron  defer to nephrology service.   PAD-s/p bilateral BKA: Normally uses prosthesis Telling some staff that he 'hasn't walked in years' . Renal navigator seeking collateral information from dialysis center Due to severe pain-He has been refusing to mobilize with PT  Note-has been refusing physical therapy-multiple times-they have signed off.   HIV (CD4 254 on 5/29) Continue HAART.   Chronic HCV: Outpatient follow-up with infectious disease.   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 Continue moxifloxacin eyedrops twice daily OD, ophthalmic lubricant 4 times daily OD.   Chronic intrahepatic /extrahepatic biliary dilatation: Incidental finding Per radiology-similar to prior CTs.   Difficult disposition: Lives alone, has friends, unclear if he has family-uninsured-SNF not a option per SW-history of ESRD with bilateral BKA-currently claims unable to ambulate with his prostheses, can't bring prostheses-hence unable to be discharged home safely.   Patient has been refusing PT/OT-multiple times-has  resolved-PT/OT have signed off. Normally-prior to this hospitalization was able to ambulate with  prosthesis and catch the bus/scat transport to go back and forth from dialysis center.    DM-2 (A1c 10.0 on 3/6) CBG stable. Continue SSI    DVT prophylaxis: Heparin  sq Code Status: Full code Family Communication: No family at bed side. Disposition Plan:    Status is: Inpatient Remains inpatient appropriate because: Severity of illness.   Consultants:  ID/nephrology/ophthalmology/interventional radiology.    Procedures:   8/27 >> IR aspiration 8/27 of of L4-L5 disc aspirate  Antimicrobials:  Anti-infectives (From admission, onward)    Start     Dose/Rate Route Frequency Ordered Stop   01/26/24 1800  bictegravir-emtricitabine -tenofovir  AF (BIKTARVY ) 50-200-25 MG per tablet 1 tablet        1 tablet Oral Daily 01/26/24 0853     01/06/24 1800  ceFEPIme  (MAXIPIME ) 2 g in sodium chloride  0.9 % 100 mL IVPB  Status:  Discontinued        2 g 200 mL/hr over 30 Minutes Intravenous Every T-Th-Sa (1800) 01/05/24 1131 01/08/24 1053   01/06/24 1200  vancomycin  (VANCOREADY) IVPB 500 mg/100 mL        500 mg 100 mL/hr over 60 Minutes Intravenous Every T-Th-Sa (Hemodialysis) 01/05/24 1130     01/03/24 1200  vancomycin  (VANCOREADY) IVPB 500 mg/100 mL        500 mg 100 mL/hr over 60 Minutes Intravenous  Once 01/02/24 1052 01/03/24 1414   01/03/24 1200  ceFEPIme  (MAXIPIME ) 2 g in sodium chloride  0.9 % 100 mL IVPB        2 g 200 mL/hr over 30 Minutes Intravenous  Once 01/02/24 1212 01/03/24 1343   01/02/24 1200  ceFEPIme  (MAXIPIME ) 2 g in sodium chloride  0.9 % 100 mL IVPB  Status:  Discontinued        2 g 200 mL/hr over 30 Minutes Intravenous Every M-W-F (Hemodialysis) 12/31/23 1221 01/05/24 1131   01/02/24 1200  vancomycin  (VANCOREADY) IVPB 500 mg/100 mL  Status:  Discontinued        500 mg 100 mL/hr over 60 Minutes Intravenous Every M-W-F (Hemodialysis) 12/31/23 1221 01/05/24 1130   01/01/24  1200  vancomycin  (VANCOREADY) IVPB 500 mg/100 mL        500 mg 100 mL/hr over 60 Minutes Intravenous Every T-Th-Sa (Hemodialysis) 12/31/23 1219 01/01/24 1609   01/01/24 1200  ceFEPIme  (MAXIPIME ) 2 g in sodium chloride  0.9 % 100 mL IVPB        2 g 200 mL/hr over 30 Minutes Intravenous Once in dialysis 12/31/23 1219 01/01/24 1840   12/30/23 1400  vancomycin  (VANCOREADY) IVPB 500 mg/100 mL        500 mg 100 mL/hr over 60 Minutes Intravenous  Once 12/30/23 1310 12/30/23 2033   12/30/23 1400  ceFEPIme  (MAXIPIME ) 1 g in sodium chloride  0.9 % 100 mL IVPB        1 g 200 mL/hr over 30 Minutes Intravenous  Once 12/30/23 1310 12/30/23 2005   12/29/23 2230  ceFEPIme  (MAXIPIME ) 1 g in sodium chloride  0.9 % 100 mL IVPB        1 g 200 mL/hr over 30 Minutes Intravenous  Once 12/29/23 2131 12/30/23 0656   12/29/23 1200  vancomycin  (VANCOREADY) IVPB 500 mg/100 mL  Status:  Discontinued        500 mg 100 mL/hr over 60 Minutes Intravenous Every M-W-F (Hemodialysis) 12/25/23 1313 12/26/23 1056   12/29/23 1200  ceFEPIme  (MAXIPIME ) 2 g in sodium chloride  0.9 % 100 mL IVPB  Status:  Discontinued  2 g 200 mL/hr over 30 Minutes Intravenous Every M-W-F (Hemodialysis) 12/26/23 1056 12/31/23 1221   12/29/23 1200  vancomycin  (VANCOREADY) IVPB 500 mg/100 mL  Status:  Discontinued        500 mg 100 mL/hr over 60 Minutes Intravenous Every M-W-F (Hemodialysis) 12/26/23 1056 12/31/23 1221   12/27/23 1400  ceFEPIme  (MAXIPIME ) 2 g in sodium chloride  0.9 % 100 mL IVPB        2 g 200 mL/hr over 30 Minutes Intravenous  Once 12/27/23 1319 12/27/23 1443   12/27/23 1200  vancomycin  (VANCOREADY) IVPB 500 mg/100 mL  Status:  Discontinued        500 mg 100 mL/hr over 60 Minutes Intravenous Every Sat (Hemodialysis) 12/25/23 1313 12/26/23 1056   12/27/23 1200  ceFEPIme  (MAXIPIME ) 2 g in sodium chloride  0.9 % 100 mL IVPB  Status:  Discontinued        2 g 200 mL/hr over 30 Minutes Intravenous Every Sat (Hemodialysis) 12/26/23  1056 12/27/23 1318   12/27/23 1200  vancomycin  (VANCOREADY) IVPB 500 mg/100 mL        500 mg 100 mL/hr over 60 Minutes Intravenous Every Sat (Hemodialysis) 12/26/23 1056 12/27/23 1227   12/26/23 1700  ciprofloxacin  (CIPRO ) tablet 500 mg        500 mg Oral  Once 12/26/23 1056 12/26/23 1704   12/25/23 1915  ciprofloxacin  (CIPRO ) tablet 500 mg        500 mg Oral  Once 12/25/23 1819 12/25/23 1849   12/25/23 1800  ceFEPIme  (MAXIPIME ) 1 g in sodium chloride  0.9 % 100 mL IVPB  Status:  Discontinued        1 g 200 mL/hr over 30 Minutes Intravenous Every 24 hours 12/25/23 1316 12/26/23 1056   12/25/23 1200  ceFEPIme  (MAXIPIME ) 2 g in sodium chloride  0.9 % 100 mL IVPB  Status:  Discontinued        2 g 200 mL/hr over 30 Minutes Intravenous Once in dialysis 12/24/23 1739 12/25/23 1316   12/25/23 1200  vancomycin  (VANCOCIN ) IVPB 1000 mg/200 mL premix  Status:  Discontinued        1,000 mg 200 mL/hr over 60 Minutes Intravenous  Once 12/24/23 1739 12/24/23 1809   12/25/23 1200  vancomycin  (VANCOREADY) IVPB 500 mg/100 mL  Status:  Discontinued        500 mg 100 mL/hr over 60 Minutes Intravenous  Once 12/24/23 1809 12/25/23 1313   12/24/23 1200  ceFEPIme  (MAXIPIME ) 2 g in sodium chloride  0.9 % 100 mL IVPB  Status:  Discontinued        2 g 200 mL/hr over 30 Minutes Intravenous Every M-W-F (Hemodialysis) 12/22/23 0851 12/25/23 1316   12/19/23 0000  ceFEPime  (MAXIPIME ) IVPB        2 g Intravenous Every M-W-F (Hemodialysis) 12/19/23 1300 02/09/24 2359   12/19/23 0000  vancomycin  IVPB        500 mg Intravenous Every M-W-F (Hemodialysis) 12/19/23 1300 02/09/24 2359   12/16/23 2200  ceFEPIme  (MAXIPIME ) 1 g in sodium chloride  0.9 % 100 mL IVPB        1 g 200 mL/hr over 30 Minutes Intravenous Every 24 hours 12/15/23 1348 12/22/23 2203   12/15/23 2230  vancomycin  (VANCOREADY) IVPB 500 mg/100 mL  Status:  Discontinued        500 mg 100 mL/hr over 60 Minutes Intravenous Every M-W-F (Hemodialysis) 12/15/23 2217  12/25/23 1313   12/15/23 1400  bictegravir-emtricitabine -tenofovir  AF (BIKTARVY ) 50-200-25 MG per tablet 1 tablet  Status:  Discontinued        1 tablet Oral Daily 12/15/23 1307 01/26/24 0853   12/15/23 1347  vancomycin  variable dose per unstable renal function (pharmacist dosing)  Status:  Discontinued         Does not apply See admin instructions 12/15/23 1348 12/15/23 2218   12/15/23 1100  ceFEPIme  (MAXIPIME ) 2 g in sodium chloride  0.9 % 100 mL IVPB        2 g 200 mL/hr over 30 Minutes Intravenous  Once 12/15/23 1048 12/15/23 1130   12/15/23 1100  vancomycin  (VANCOCIN ) IVPB 1000 mg/200 mL premix        1,000 mg 200 mL/hr over 60 Minutes Intravenous  Once 12/15/23 1048 12/15/23 1154       Subjective: Patient was seen and examined at bedside.  Overnight events noted. Patient reports feeling better,  She reports still has a lot of pain but this is well-controlled.   Objective: Vitals:   02/03/24 1415 02/03/24 2022 02/04/24 0508 02/04/24 0740  BP: 107/82 131/83 94/71 91/71   Pulse: 88 87 77 76  Resp:   16   Temp: 97.7 F (36.5 C) 98.5 F (36.9 C) 98.4 F (36.9 C) 97.7 F (36.5 C)  TempSrc: Oral     SpO2: 100% 100% 100% 100%  Weight:      Height:        Intake/Output Summary (Last 24 hours) at 02/04/2024 1149 Last data filed at 02/03/2024 1718 Gross per 24 hour  Intake 342 ml  Output 0 ml  Net 342 ml   Filed Weights   01/31/24 1140 02/03/24 0820 02/03/24 1200  Weight: 50 kg 40.5 kg 40.4 kg    Examination:  General exam: Appears calm and comfortable, deconditioned, not in any acute distress. Respiratory system: Clear to auscultation. Respiratory effort normal.  RR 12 Cardiovascular system: S1 & S2 heard, RRR. No JVD, murmurs, rubs, gallops or clicks.  Gastrointestinal system: Abdomen is non distended, soft and non tender. Normal bowel sounds heard. Central nervous system: Alert and oriented x 3. No focal neurological deficits. Extremities: Bilateral BKA Skin: No  rashes, lesions or ulcers Psychiatry: Judgement and insight appear normal. Mood & affect appropriate.   Data Reviewed: I have personally reviewed following labs and imaging studies  CBC: Recent Labs  Lab 01/29/24 0707 02/01/24 0309  WBC 6.3 8.0  HGB 9.2* 9.7*  HCT 28.8* 29.9*  MCV 99.7 97.4  PLT 185 162   Basic Metabolic Panel: Recent Labs  Lab 01/29/24 0707 02/01/24 0309  NA 132* 129*  K 4.8 3.5  CL 92* 93*  CO2 22 23  GLUCOSE 95 163*  BUN 31* 18  CREATININE 4.54* 2.77*  CALCIUM  9.5 9.1  PHOS 4.4 2.7   GFR: Estimated Creatinine Clearance: 17.6 mL/min (A) (by C-G formula based on SCr of 2.77 mg/dL (H)). Liver Function Tests: Recent Labs  Lab 01/29/24 0707 02/01/24 0309  ALBUMIN  2.1* 2.4*   No results for input(s): LIPASE, AMYLASE in the last 168 hours. No results for input(s): AMMONIA in the last 168 hours. Coagulation Profile: No results for input(s): INR, PROTIME in the last 168 hours. Cardiac Enzymes: No results for input(s): CKTOTAL, CKMB, CKMBINDEX, TROPONINI in the last 168 hours. BNP (last 3 results) No results for input(s): PROBNP in the last 8760 hours. HbA1C: No results for input(s): HGBA1C in the last 72 hours. CBG: Recent Labs  Lab 02/03/24 0638 02/03/24 1624 02/03/24 2120 02/04/24 0635 02/04/24 1121  GLUCAP 119* 82 97 82 80  Lipid Profile: No results for input(s): CHOL, HDL, LDLCALC, TRIG, CHOLHDL, LDLDIRECT in the last 72 hours. Thyroid Function Tests: No results for input(s): TSH, T4TOTAL, FREET4, T3FREE, THYROIDAB in the last 72 hours. Anemia Panel: No results for input(s): VITAMINB12, FOLATE, FERRITIN, TIBC, IRON , RETICCTPCT in the last 72 hours. Sepsis Labs: No results for input(s): PROCALCITON, LATICACIDVEN in the last 168 hours.  No results found for this or any previous visit (from the past 240 hours).   Radiology Studies: No results found.  Scheduled Meds:   (feeding supplement) PROSource Plus  30 mL Oral Daily   acetaminophen   1,000 mg Oral TID   artificial tears   Right Eye QID   bictegravir-emtricitabine -tenofovir  AF  1 tablet Oral Daily   Chlorhexidine  Gluconate Cloth  6 each Topical Q0600   darbepoetin (ARANESP ) injection - DIALYSIS  150 mcg Subcutaneous Q Fri-1800   feeding supplement (NEPRO CARB STEADY)  237 mL Oral BID AC & HS   fentaNYL   1 patch Transdermal Q72H   gabapentin   200 mg Oral TID   gatifloxacin   1 drop Right Eye BID   heparin   5,000 Units Subcutaneous Q8H   insulin  aspart  0-5 Units Subcutaneous QHS   insulin  aspart  0-6 Units Subcutaneous TID WC   lidocaine   2 patch Transdermal Q24H   methocarbamol   500 mg Oral TID   midodrine   10 mg Oral TID WC   midodrine   10 mg Oral Q T,Th,Sa-HD   multivitamin  1 tablet Oral QHS   pantoprazole   40 mg Oral BID AC   polyethylene glycol  17 g Oral Daily   sevelamer  carbonate  800 mg Oral TID WC   Continuous Infusions:  vancomycin  500 mg (02/03/24 1854)     LOS: 51 days    Time spent: 50 mins    Darcel Dawley, MD [facility]   If 7PM-7AM, please contact night-coverage

## 2024-02-04 NOTE — Progress Notes (Signed)
 Patient ID: Alvin Daniels, male   DOB: Jul 16, 1970, 53 y.o.   MRN: [id number] [facility] Progress Note   Assessment/ Plan:   1.  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  Duration of antibiotic treatment to end on 02/18/2024.  Unfortunately he is uninsured and unable to be placed at a skilled nursing facility for ongoing management. 2. ESRD: Previously on MWF dialysis schedule and currently TTS while in the hospital (reports that he desires the latter while here).  Will order for dialysis again tomorrow with cautious ultrafiltration given limited intake. 3. Anemia: Low hemoglobin/hematocrit however appears to be improving with ongoing ESA.  Suspect significant role for malnutrition/inflammation complex with associated infection. 4. CKD-MBD: Calcium  and phosphorus levels currently at goal.  Calcitriol  on hold due to previous hypercalcemia. 5. Nutrition: Continue renal diet/fluid restriction and ongoing protein supplementation. 6. Hypotension: Chronic, monitor with ongoing midodrine  and efforts at volume management.  Subjective:   Reports intermittent pain in his left thigh otherwise tolerable back pain.   Objective:   BP 91/71   Pulse 76   Temp 97.7 F (36.5 C)   Resp 16   Ht 4' 11 (1.499 m)   Wt 40.4 kg   SpO2 100%   BMI 17.99 kg/m   Physical Exam: Gen: Chronically ill-appearing man resting comfortably in bed, speaking on phone CVS: Pulse regular rhythm, normal rate, S1 and S2 normal Resp: Clear to auscultation bilaterally, no rales/rhonchi.  Right IJ TDC in place Abd: Soft, flat, nontender, bowel sounds normal Ext: Status post bilateral below-knee amputations.  Palpable tenderness left thigh  Labs: BMET Recent Labs  Lab 01/29/24 0707 02/01/24 0309  NA 132* 129*  K 4.8 3.5  CL 92* 93*  CO2 22 23  GLUCOSE 95 163*  BUN 31* 18  CREATININE 4.54* 2.77*  CALCIUM  9.5  9.1  PHOS 4.4 2.7   CBC Recent Labs  Lab 01/29/24 0707 02/01/24 0309  WBC 6.3 8.0  HGB 9.2* 9.7*  HCT 28.8* 29.9*  MCV 99.7 97.4  PLT 185 162    Medications:     (feeding supplement) PROSource Plus  30 mL Oral Daily   acetaminophen   1,000 mg Oral TID   artificial tears   Right Eye QID   bictegravir-emtricitabine -tenofovir  AF  1 tablet Oral Daily   Chlorhexidine  Gluconate Cloth  6 each Topical Q0600   darbepoetin (ARANESP ) injection - DIALYSIS  150 mcg Subcutaneous Q Fri-1800   feeding supplement (NEPRO CARB STEADY)  237 mL Oral BID AC & HS   fentaNYL   1 patch Transdermal Q72H   gabapentin   200 mg Oral TID   gatifloxacin   1 drop Right Eye BID   heparin   5,000 Units Subcutaneous Q8H   insulin  aspart  0-5 Units Subcutaneous QHS   insulin  aspart  0-6 Units Subcutaneous TID WC   lidocaine   2 patch Transdermal Q24H   methocarbamol   500 mg Oral TID   midodrine   10 mg Oral TID WC   midodrine   10 mg Oral Q T,Th,Sa-HD   multivitamin  1 tablet Oral QHS   pantoprazole   40 mg Oral BID AC   polyethylene glycol  17 g Oral Daily   sevelamer  carbonate  800 mg Oral TID WC   Gordy Blanch, MD 02/04/2024, 10:09 AM

## 2024-02-05 LAB — CBC
HCT: 33 % — ABNORMAL LOW (ref 39.0–52.0)
Hemoglobin: 10.5 g/dL — ABNORMAL LOW (ref 13.0–17.0)
MCH: 31.5 pg (ref 26.0–34.0)
MCHC: 31.8 g/dL (ref 30.0–36.0)
MCV: 99.1 fL (ref 80.0–100.0)
Platelets: 162 K/uL (ref 150–400)
RBC: 3.33 MIL/uL — ABNORMAL LOW (ref 4.22–5.81)
RDW: 19.2 % — ABNORMAL HIGH (ref 11.5–15.5)
WBC: 5.5 K/uL (ref 4.0–10.5)
nRBC: 0 % (ref 0.0–0.2)

## 2024-02-05 LAB — GLUCOSE, CAPILLARY
Glucose-Capillary: 114 mg/dL — ABNORMAL HIGH (ref 70–99)
Glucose-Capillary: 86 mg/dL (ref 70–99)
Glucose-Capillary: 88 mg/dL (ref 70–99)

## 2024-02-05 LAB — BASIC METABOLIC PANEL WITH GFR
Anion gap: 15 (ref 5–15)
BUN: 29 mg/dL — ABNORMAL HIGH (ref 6–20)
CO2: 22 mmol/L (ref 22–32)
Calcium: 9.3 mg/dL (ref 8.9–10.3)
Chloride: 94 mmol/L — ABNORMAL LOW (ref 98–111)
Creatinine, Ser: 3.64 mg/dL — ABNORMAL HIGH (ref 0.61–1.24)
GFR, Estimated: 19 mL/min — ABNORMAL LOW (ref >60)
Glucose, Bld: 123 mg/dL — ABNORMAL HIGH (ref 70–99)
Potassium: 4 mmol/L (ref 3.5–5.1)
Sodium: 131 mmol/L — ABNORMAL LOW (ref 135–145)

## 2024-02-05 MED ORDER — HEPARIN SODIUM (PORCINE) 1000 UNIT/ML IJ SOLN
1000.0000 [IU] | Freq: Once | INTRAMUSCULAR | Status: AC
  Administered 2024-02-05: 3800 [IU]

## 2024-02-05 MED ORDER — HEPARIN SODIUM (PORCINE) 1000 UNIT/ML DIALYSIS: 40.0000 [IU]/kg | INTRAMUSCULAR | Status: DC | PRN

## 2024-02-05 MED ORDER — HEPARIN SODIUM (PORCINE) 1000 UNIT/ML IJ SOLN
INTRAMUSCULAR | Status: AC
  Filled 2024-02-05: qty 4

## 2024-02-05 NOTE — Progress Notes (Signed)
 Nutrition Follow-up  DOCUMENTATION CODES:   Severe malnutrition in context of chronic illness  INTERVENTION:  Continue liberalized diet to maximize intake  Continue 30 ml ProSource Plus daily, each supplement provides 100 kcals and 15 grams protein.   Continue Nepro Shake po daily before breakfast and at bedtime, each supplement provides 425 kcal and 19 grams protein   Continue renal MVI  NUTRITION DIAGNOSIS:  Severe Malnutrition related to chronic illness as evidenced by severe muscle depletion, severe fat depletion. Remains applicable  GOAL:  Patient will meet greater than or equal to 90% of their needs Progressing  MONITOR:  PO intake, Supplement acceptance  REASON FOR ASSESSMENT:   Rounds Poor PO  ASSESSMENT:   Pt with PMH significant for: ESRD-HD, HIV, PAD s/p bilateral BKA, chronic HCV. Of note, recently admiteed 5/28 - 6/06 for MRSA bacteremia with left fourth finger osteomyelitis (amputation 5/29). Presenting to hospital w/ R hip pain and found to have L4-L5 osteomyelitis with abscess.  Patient consistently refusing PT/OT and has been dropped from service. States he is in too much pain to participate. Unable to ambulate with prosthesis and, therefore, unsafe to discharge.    Spoke with pt who was resting in bed after dialysis treatment. Pt reports he tolerated treatment well, able to have 1L removed. Pt reports he is hungry at this time since he has missed his lunch, reached out to RN to ensure pt will get tray delivered. Obtained some floor snacks for pt in the meantime.   Pt reports good appetite, states his appetite has improved since last week. No recent diet summary documentation for last few days to confirm at this time. Pt reports no GI discomforts. Pt reports continued Nepro intake. Encouraged Prosource intake BID as well.   Admit Weight: 40.3 kg Current Weight: 42.2 kg Last HD tx: 9/25  UF achieved: ongoing, -1L today   Wt stable. No edema, per nephrology.  Only rinseback being removed during his dialysis txs. BPs soft. Continue to monitor need for challenging, as he has likely lost weight d/t poor intake. No SOB reported. New stage II to sacrum noted.    Drains/Lines: LUE: AVF  HDC: right internal jugular (double lumen) UOP: anuric   Sodium remains low. Could be some fluid overload and/or poor oral intake. PHOS/K+ within range for dialysis patient.   Meds: Biktarvy , darbepoetin alfa , SS Novolog  0-5 QHS, SS Novolog  0-6 TID, pantoprazole , midodrine , RenaVit daily, miralax , Renvela   Labs from 9/13 reviewed: Na+ 131>132>130 (L) CBGs 80-133 x24 hours A1c 10.0 (07/2023) BUN 29/Creatinine 3.64  Diet Order:   Diet Order             Diet regular Room service appropriate? Yes with Assist; Fluid consistency: Thin; Fluid restriction: 1200 mL Fluid  Diet effective now             EDUCATION NEEDS:  Education needs have been addressed  Skin:  Skin Assessment: Skin Integrity Issues: Skin Integrity Issues:: Stage II Stage II: sacrum  Last BM:  9/24 type 7  Height:  Ht Readings from Last 1 Encounters:  12/14/23 4' 11 (1.499 m)   Weight:  Wt Readings from Last 1 Encounters:  02/05/24 42.2 kg   Ideal Body Weight:  45.5 kg  BMI:  Body mass index is 18.79 kg/m.  Estimated Nutritional Needs:   Kcal:  1400-1600 kcals  Protein:  65-80g  Fluid:  1L + UOP    Josette Glance, MS, RDN, LDN Clinical Dietitian I Please reach out via secure  chat

## 2024-02-05 NOTE — Procedures (Signed)
 Received patient in bed to unit.  Alert and oriented.  Informed consent signed and in chart.  RIJ dual lumen catheter accessed per policy without difficulty. Tx initiated per MD order. UF goal 2000 ml. 0912 B/P down, UF off, 100 ml NS given, UF goal reduced to . 9060 B/P down, 100 ml NS given. Will continue to monitor. 1130 B/P stablized, UF on goal reduced to .  1145 B/P down, UF off, 100 ml NS given TX duration:3.5 Tx complete. Blood returned. Dual lumen catheter flushed with NS, heparin  left to dwell, caps and clamps in place. Patient tolerated well.  Transported back to the room  Alert, without acute distress.  Hand-off given to patient's nurse.   Access used: RIJ Dual lumen catheter Access issues: none  Total UF removed: 500 Medication(s) given: See MAR   [facility]

## 2024-02-05 NOTE — Procedures (Signed)
 Patient seen on Hemodialysis. BP 96/63   Pulse 78   Temp 97.8 F (36.6 C)   Resp 14   Ht 4' 11 (1.499 m)   Wt 42.2 kg   SpO2 100%   BMI 18.79 kg/m   QB 400, UF goal 1L (goal revised because of hypotension) Tolerating treatment without complaints at this time.   Gordy Blanch MD [facility]. Office # [phone] Pager # [phone] 10:07 AM

## 2024-02-05 NOTE — Progress Notes (Signed)
 PROGRESS NOTE    Alvin Daniels  FMW:[id number] DOB: [birth year] DOA: 12/14/2023 PCP: Pcp, No   Brief Narrative:  This 53 yrs old male with PMH significant for PAD s/p bilateral BKA, ESRD on HD, HIV, chronic HCV-recent hospitalization from 5/28-6/6 for MRSA bacteremia with left fourth finger osteomyelitis (s/p amputation 5/29)-presented to the hospital with right hip pain. Patient was found to have L4-L5 osteomyelitis with psoas abscess.  Patient was admitted for further evaluation and orthopedics is consulted.   Significant studies: 6/4>> TEE: EF 60-65%, 1.8 x 1.2 mass right atrial wall- vegetation from old HD catheter impinged on RA wall-versus mass/tumor. 8/4>> MRI pelvis: Discitis/osteomyelitis L4-5-right psoas abscess. 8/5>> CT abdomen/pelvis: Discitis/osteomyelitis L4-5 with paraspinal inflammatory changes/right psoas abscess.  8/8>> MRI LS spine: L4-5 discitis/osteomyelitis with associated paraspinal inflammatory changes, right psoas abscess. 8/20>> MRI left hip: Discitis of the lower lumbar spine with extension into the psoas muscle. 8/20>> MRI left knee: BKA-no significant abnormality. 8/25>> MRI LS spine: Persistent/ongoing changes of osteomyelitis at L4/L5 with associated epidural phlegmon/abscess within the ventral epidural space at L4/L5.  Associated multiloculated bilateral psoas abscess measuring up to 3.1 cm on the left.   Significant microbiology data: 5/28>> blood culture: MRSA 5/29>> left finger abscess: E. coli/Streptococcus anginosus, Bacteroides fragilis. 8/4>> blood culture: No growth 8/27 >> L4-L5 disc aspirate culture: MRSA.     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 coli on bone culture) As per prior notes,  Patient is not a surgical candidate. IR was able to aspirate from L4-L5 >positive for MRSA on 01/07/24. As per last ID note from Dr. Overton, recommended 6 weeks of IV vancomycin  from 8/27 -EOT around 02/18/24. Will need reimaging at some point-closer to antibiotic EOT ~10/1.   Chronic hypotension: Continue midodrine    ESRD on HD MWF: Nephrology following.  Patient had hemodialysis today.   Normocytic anemia: Secondary to ESRD Hb stable. Continue Aranesp /iron  defer to nephrology service.   PAD-s/p bilateral BKA: Normally uses prosthesis. Telling some staff that he 'hasn't walked in years' . Renal navigator seeking collateral information from dialysis center Due to severe pain-He has been refusing to mobilize with PT  Note-has been refusing physical therapy-multiple times-they have signed off.   HIV (CD4 254 on 5/29) Continue HAART.   Chronic HCV: Outpatient follow-up with infectious disease.   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 Continue moxifloxacin eyedrops twice daily OD, ophthalmic lubricant 4 times daily OD.   Chronic intrahepatic /extrahepatic biliary dilatation: Incidental finding Per radiology-similar to prior CTs.   Difficult disposition: Lives alone, has friends, unclear if he has family-uninsured-SNF not a option per SW-history of ESRD with bilateral BKA-currently claims unable to ambulate with his prostheses, can't bring prostheses-hence unable to be discharged home safely.   Patient has been refusing PT/OT-multiple  times-has resolved-PT/OT have signed off. Normally-prior to this hospitalization was able to ambulate  with prosthesis and catch the bus/scat transport to go back and forth from dialysis center.    DM-2 (A1c 10.0 on 3/6) CBG stable. Continue SSI    DVT prophylaxis: Heparin  sq Code Status: Full code Family Communication: No family at bed side. Disposition Plan:    Status is: Inpatient Remains inpatient appropriate because: Severity of illness.   Consultants:  ID/nephrology/ophthalmology/interventional radiology.    Procedures:   8/27 >> IR aspiration 8/27 of of L4-L5 disc aspirate  Antimicrobials:  Anti-infectives (From admission, onward)    Start     Dose/Rate Route Frequency Ordered Stop   01/26/24 1800  bictegravir-emtricitabine -tenofovir  AF (BIKTARVY ) 50-200-25 MG per tablet 1 tablet        1 tablet Oral Daily 01/26/24 0853     01/06/24 1800  ceFEPIme  (MAXIPIME ) 2 g in sodium chloride  0.9 % 100 mL IVPB  Status:  Discontinued        2 g 200 mL/hr over 30 Minutes Intravenous Every T-Th-Sa (1800) 01/05/24 1131 01/08/24 1053   01/06/24 1200  vancomycin  (VANCOREADY) IVPB 500 mg/100 mL        500 mg 100 mL/hr over 60 Minutes Intravenous Every T-Th-Sa (Hemodialysis) 01/05/24 1130     01/03/24 1200  vancomycin  (VANCOREADY) IVPB 500 mg/100 mL        500 mg 100 mL/hr over 60 Minutes Intravenous  Once 01/02/24 1052 01/03/24 1414   01/03/24 1200  ceFEPIme  (MAXIPIME ) 2 g in sodium chloride  0.9 % 100 mL IVPB        2 g 200 mL/hr over 30 Minutes Intravenous  Once 01/02/24 1212 01/03/24 1343   01/02/24 1200  ceFEPIme  (MAXIPIME ) 2 g in sodium chloride  0.9 % 100 mL IVPB  Status:  Discontinued        2 g 200 mL/hr over 30 Minutes Intravenous Every M-W-F (Hemodialysis) 12/31/23 1221 01/05/24 1131   01/02/24 1200  vancomycin  (VANCOREADY) IVPB 500 mg/100 mL  Status:  Discontinued        500 mg 100 mL/hr over 60 Minutes Intravenous Every M-W-F (Hemodialysis) 12/31/23 1221 01/05/24 1130    01/01/24 1200  vancomycin  (VANCOREADY) IVPB 500 mg/100 mL        500 mg 100 mL/hr over 60 Minutes Intravenous Every T-Th-Sa (Hemodialysis) 12/31/23 1219 01/01/24 1609   01/01/24 1200  ceFEPIme  (MAXIPIME ) 2 g in sodium chloride  0.9 % 100 mL IVPB        2 g 200 mL/hr over 30 Minutes Intravenous Once in dialysis 12/31/23 1219 01/01/24 1840   12/30/23 1400  vancomycin  (VANCOREADY) IVPB 500 mg/100 mL        500 mg 100 mL/hr over 60 Minutes Intravenous  Once 12/30/23 1310 12/30/23 2033   12/30/23 1400  ceFEPIme  (MAXIPIME ) 1 g in sodium chloride  0.9 % 100 mL IVPB        1 g 200 mL/hr over 30 Minutes Intravenous  Once 12/30/23 1310 12/30/23 2005   12/29/23 2230  ceFEPIme  (MAXIPIME ) 1 g in sodium chloride  0.9 % 100 mL IVPB        1 g 200 mL/hr over 30 Minutes Intravenous  Once 12/29/23 2131 12/30/23 0656   12/29/23 1200  vancomycin  (VANCOREADY) IVPB 500 mg/100 mL  Status:  Discontinued        500 mg 100 mL/hr over 60 Minutes Intravenous Every M-W-F (Hemodialysis) 12/25/23 1313 12/26/23 1056   12/29/23 1200  ceFEPIme  (MAXIPIME ) 2 g in sodium chloride  0.9 % 100 mL IVPB  Status:  Discontinued  2 g 200 mL/hr over 30 Minutes Intravenous Every M-W-F (Hemodialysis) 12/26/23 1056 12/31/23 1221   12/29/23 1200  vancomycin  (VANCOREADY) IVPB 500 mg/100 mL  Status:  Discontinued        500 mg 100 mL/hr over 60 Minutes Intravenous Every M-W-F (Hemodialysis) 12/26/23 1056 12/31/23 1221   12/27/23 1400  ceFEPIme  (MAXIPIME ) 2 g in sodium chloride  0.9 % 100 mL IVPB        2 g 200 mL/hr over 30 Minutes Intravenous  Once 12/27/23 1319 12/27/23 1443   12/27/23 1200  vancomycin  (VANCOREADY) IVPB 500 mg/100 mL  Status:  Discontinued        500 mg 100 mL/hr over 60 Minutes Intravenous Every Sat (Hemodialysis) 12/25/23 1313 12/26/23 1056   12/27/23 1200  ceFEPIme  (MAXIPIME ) 2 g in sodium chloride  0.9 % 100 mL IVPB  Status:  Discontinued        2 g 200 mL/hr over 30 Minutes Intravenous Every Sat (Hemodialysis)  12/26/23 1056 12/27/23 1318   12/27/23 1200  vancomycin  (VANCOREADY) IVPB 500 mg/100 mL        500 mg 100 mL/hr over 60 Minutes Intravenous Every Sat (Hemodialysis) 12/26/23 1056 12/27/23 1227   12/26/23 1700  ciprofloxacin  (CIPRO ) tablet 500 mg        500 mg Oral  Once 12/26/23 1056 12/26/23 1704   12/25/23 1915  ciprofloxacin  (CIPRO ) tablet 500 mg        500 mg Oral  Once 12/25/23 1819 12/25/23 1849   12/25/23 1800  ceFEPIme  (MAXIPIME ) 1 g in sodium chloride  0.9 % 100 mL IVPB  Status:  Discontinued        1 g 200 mL/hr over 30 Minutes Intravenous Every 24 hours 12/25/23 1316 12/26/23 1056   12/25/23 1200  ceFEPIme  (MAXIPIME ) 2 g in sodium chloride  0.9 % 100 mL IVPB  Status:  Discontinued        2 g 200 mL/hr over 30 Minutes Intravenous Once in dialysis 12/24/23 1739 12/25/23 1316   12/25/23 1200  vancomycin  (VANCOCIN ) IVPB 1000 mg/200 mL premix  Status:  Discontinued        1,000 mg 200 mL/hr over 60 Minutes Intravenous  Once 12/24/23 1739 12/24/23 1809   12/25/23 1200  vancomycin  (VANCOREADY) IVPB 500 mg/100 mL  Status:  Discontinued        500 mg 100 mL/hr over 60 Minutes Intravenous  Once 12/24/23 1809 12/25/23 1313   12/24/23 1200  ceFEPIme  (MAXIPIME ) 2 g in sodium chloride  0.9 % 100 mL IVPB  Status:  Discontinued        2 g 200 mL/hr over 30 Minutes Intravenous Every M-W-F (Hemodialysis) 12/22/23 0851 12/25/23 1316   12/19/23 0000  ceFEPime  (MAXIPIME ) IVPB        2 g Intravenous Every M-W-F (Hemodialysis) 12/19/23 1300 02/09/24 2359   12/19/23 0000  vancomycin  IVPB        500 mg Intravenous Every M-W-F (Hemodialysis) 12/19/23 1300 02/09/24 2359   12/16/23 2200  ceFEPIme  (MAXIPIME ) 1 g in sodium chloride  0.9 % 100 mL IVPB        1 g 200 mL/hr over 30 Minutes Intravenous Every 24 hours 12/15/23 1348 12/22/23 2203   12/15/23 2230  vancomycin  (VANCOREADY) IVPB 500 mg/100 mL  Status:  Discontinued        500 mg 100 mL/hr over 60 Minutes Intravenous Every M-W-F (Hemodialysis)  12/15/23 2217 12/25/23 1313   12/15/23 1400  bictegravir-emtricitabine -tenofovir  AF (BIKTARVY ) 50-200-25 MG per tablet 1 tablet  Status:  Discontinued        1 tablet Oral Daily 12/15/23 1307 01/26/24 0853   12/15/23 1347  vancomycin  variable dose per unstable renal function (pharmacist dosing)  Status:  Discontinued         Does not apply See admin instructions 12/15/23 1348 12/15/23 2218   12/15/23 1100  ceFEPIme  (MAXIPIME ) 2 g in sodium chloride  0.9 % 100 mL IVPB        2 g 200 mL/hr over 30 Minutes Intravenous  Once 12/15/23 1048 12/15/23 1130   12/15/23 1100  vancomycin  (VANCOCIN ) IVPB 1000 mg/200 mL premix        1,000 mg 200 mL/hr over 60 Minutes Intravenous  Once 12/15/23 1048 12/15/23 1154       Subjective: Patient was seen and examined at bedside.  Overnight events noted. Patient reports feeling better, he just came from hemodialysis.   Blood pressure remains low  and given midodrine .   Objective: Vitals:   02/05/24 1145 02/05/24 1200 02/05/24 1232 02/05/24 1233  BP: (!) 81/62 101/68 (!) 112/93 139/77  Pulse: 85 85 88 90  Resp: 12 14 (!) 21 (!) 9  Temp:    (!) 97.2 F (36.2 C)  TempSrc:      SpO2: 100% 100% 100% 100%  Weight:      Height:        Intake/Output Summary (Last 24 hours) at 02/05/2024 1412 Last data filed at 02/05/2024 1233 Gross per 24 hour  Intake --  Output -500 ml  Net 500 ml   Filed Weights   02/03/24 0820 02/03/24 1200 02/05/24 0832  Weight: 40.5 kg 40.4 kg 42.2 kg    Examination:  General exam: Appears calm and comfortable, deconditioned, not in any acute distress. Respiratory system: CTA Bilaterally. Respiratory effort normal.  RR 13 Cardiovascular system: S1 & S2 heard, RRR. No JVD, murmurs, rubs, gallops or clicks.  Gastrointestinal system: Abdomen is non distended, soft and non tender. Normal bowel sounds heard. Central nervous system: Alert and oriented x 3. No focal neurological deficits. Extremities: Bilateral BKA Skin: No  rashes, lesions or ulcers Psychiatry: Judgement and insight appear normal. Mood & affect appropriate.   Data Reviewed: I have personally reviewed following labs and imaging studies  CBC: Recent Labs  Lab 02/01/24 0309 02/05/24 0557  WBC 8.0 5.5  HGB 9.7* 10.5*  HCT 29.9* 33.0*  MCV 97.4 99.1  PLT 162 162   Basic Metabolic Panel: Recent Labs  Lab 02/01/24 0309 02/05/24 0557  NA 129* 131*  K 3.5 4.0  CL 93* 94*  CO2 23 22  GLUCOSE 163* 123*  BUN 18 29*  CREATININE 2.77* 3.64*  CALCIUM  9.1 9.3  PHOS 2.7  --    GFR: Estimated Creatinine Clearance: 14 mL/min (A) (by C-G formula based on SCr of 3.64 mg/dL (H)). Liver Function Tests: Recent Labs  Lab 02/01/24 0309  ALBUMIN  2.4*   No results for input(s): LIPASE, AMYLASE in the last 168 hours. No results for input(s): AMMONIA in the last 168 hours. Coagulation Profile: No results for input(s): INR, PROTIME in the last 168 hours. Cardiac Enzymes: No results for input(s): CKTOTAL, CKMB, CKMBINDEX, TROPONINI in the last 168 hours. BNP (last 3 results) No results for input(s): PROBNP in the last 8760 hours. HbA1C: No results for input(s): HGBA1C in the last 72 hours. CBG: Recent Labs  Lab 02/04/24 1121 02/04/24 1641 02/04/24 2130 02/05/24 0556 02/05/24 1402  GLUCAP 80 80 133* 114* 86   Lipid Profile: No results for  input(s): CHOL, HDL, LDLCALC, TRIG, CHOLHDL, LDLDIRECT in the last 72 hours. Thyroid Function Tests: No results for input(s): TSH, T4TOTAL, FREET4, T3FREE, THYROIDAB in the last 72 hours. Anemia Panel: No results for input(s): VITAMINB12, FOLATE, FERRITIN, TIBC, IRON , RETICCTPCT in the last 72 hours. Sepsis Labs: No results for input(s): PROCALCITON, LATICACIDVEN in the last 168 hours.  No results found for this or any previous visit (from the past 240 hours).   Radiology Studies: No results found.  Scheduled Meds:  (feeding  supplement) PROSource Plus  30 mL Oral Daily   acetaminophen   1,000 mg Oral TID   artificial tears   Right Eye QID   bictegravir-emtricitabine -tenofovir  AF  1 tablet Oral Daily   Chlorhexidine  Gluconate Cloth  6 each Topical Q0600   darbepoetin (ARANESP ) injection - DIALYSIS  150 mcg Subcutaneous Q Fri-1800   feeding supplement (NEPRO CARB STEADY)  237 mL Oral BID AC & HS   fentaNYL   1 patch Transdermal Q72H   gabapentin   200 mg Oral TID   gatifloxacin   1 drop Right Eye BID   heparin   5,000 Units Subcutaneous Q8H   insulin  aspart  0-5 Units Subcutaneous QHS   insulin  aspart  0-6 Units Subcutaneous TID WC   lidocaine   2 patch Transdermal Q24H   methocarbamol   500 mg Oral TID   midodrine   10 mg Oral TID WC   midodrine   10 mg Oral Q T,Th,Sa-HD   multivitamin  1 tablet Oral QHS   pantoprazole   40 mg Oral BID AC   polyethylene glycol  17 g Oral Daily   sevelamer  carbonate  800 mg Oral TID WC   Continuous Infusions:  vancomycin  500 mg (02/03/24 1854)     LOS: 52 days    Time spent: 35 mins    Darcel Dawley, MD [facility]   If 7PM-7AM, please contact night-coverage

## 2024-02-06 LAB — GLUCOSE, CAPILLARY
Glucose-Capillary: 119 mg/dL — ABNORMAL HIGH (ref 70–99)
Glucose-Capillary: 122 mg/dL — ABNORMAL HIGH (ref 70–99)
Glucose-Capillary: 128 mg/dL — ABNORMAL HIGH (ref 70–99)
Glucose-Capillary: 127 mg/dL — ABNORMAL HIGH (ref 70–99)
Glucose-Capillary: 101 mg/dL — ABNORMAL HIGH (ref 70–99)

## 2024-02-06 NOTE — Plan of Care (Signed)

## 2024-02-06 NOTE — Progress Notes (Signed)
 Patient ID: Alvin Daniels, male   DOB: [birth year], 53 y.o.   MRN: [id number] [facility] Progress Note   Assessment/ Plan:   1.  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  Duration of antibiotic treatment to end on 02/18/2024.  Unfortunately he is uninsured and unable to be placed at a skilled nursing facility for ongoing management. 2. ESRD: Previously on MWF dialysis schedule and currently TTS while in the hospital (reports that he desires the latter while here).  Next dialysis treatment has been ordered again for tomorrow. 3. Anemia: Low hemoglobin/hematocrit however appears to be improving with ongoing ESA.  Suspect significant role for malnutrition/inflammation complex with associated infection. 4. CKD-MBD: Calcium  and phosphorus levels currently at goal.  Calcitriol  on hold due to previous hypercalcemia. 5. Nutrition: Continue renal diet/fluid restriction and ongoing protein supplementation. 6. Hypotension: Chronic, monitor with ongoing midodrine  and efforts at volume management.  Subjective:   Denies any complaints other than intermittent back pain and pain in his left arm.  Challenges noted with ambulation because of increased work with PT and he did not bring his prosthesis.   Objective:   BP 118/84 (BP Location: Right Arm)   Pulse 90   Temp 97.8 F (36.6 C) (Oral)   Resp 16   Ht 4' 11 (1.499 m)   Wt 42.2 kg   SpO2 98%   BMI 18.79 kg/m   Physical Exam: Gen: Chronically ill-appearing man resting comfortably in bed, watching television CVS: Pulse regular rhythm, normal rate, S1 and S2 normal Resp: Clear to auscultation bilaterally, no rales/rhonchi.  Right IJ TDC in place Abd: Soft, flat, nontender, bowel sounds normal Ext: Status post bilateral below-knee amputations.    Labs: BMET Recent Labs  Lab 02/01/24 0309 02/05/24 0557  NA 129* 131*  K 3.5 4.0  CL 93*  94*  CO2 23 22  GLUCOSE 163* 123*  BUN 18 29*  CREATININE 2.77* 3.64*  CALCIUM  9.1 9.3  PHOS 2.7  --    CBC Recent Labs  Lab 02/01/24 0309 02/05/24 0557  WBC 8.0 5.5  HGB 9.7* 10.5*  HCT 29.9* 33.0*  MCV 97.4 99.1  PLT 162 162    Medications:     (feeding supplement) PROSource Plus  30 mL Oral Daily   acetaminophen   1,000 mg Oral TID   artificial tears   Right Eye QID   bictegravir-emtricitabine -tenofovir  AF  1 tablet Oral Daily   Chlorhexidine  Gluconate Cloth  6 each Topical Q0600   darbepoetin (ARANESP ) injection - DIALYSIS  150 mcg Subcutaneous Q Fri-1800   feeding supplement (NEPRO CARB STEADY)  237 mL Oral BID AC & HS   fentaNYL   1 patch Transdermal Q72H   gabapentin   200 mg Oral TID   gatifloxacin   1 drop Right Eye BID   heparin   5,000 Units Subcutaneous Q8H   insulin  aspart  0-5 Units Subcutaneous QHS   insulin  aspart  0-6 Units Subcutaneous TID WC   lidocaine   2 patch Transdermal Q24H   methocarbamol   500 mg Oral TID   midodrine   10 mg Oral TID WC   midodrine   10 mg Oral Q T,Th,Sa-HD   multivitamin  1 tablet Oral QHS   pantoprazole   40 mg Oral BID AC   polyethylene glycol  17 g Oral Daily   sevelamer  carbonate  800 mg Oral TID WC   Gordy Blanch, MD 02/06/2024, 9:04 AM

## 2024-02-06 NOTE — Progress Notes (Signed)
 PROGRESS NOTE    Alvin Daniels  FMW:[id number] DOB: 1971-01-31 DOA: 12/14/2023 PCP: Pcp, No   Brief Narrative:  This 53 yrs old male with PMH significant for PAD s/p bilateral BKA, ESRD on HD, HIV, chronic HCV-recent hospitalization from 5/28-6/6 for MRSA bacteremia with left fourth finger osteomyelitis (s/p amputation 5/29)-presented to the hospital with right hip pain. Patient was found to have L4-L5 osteomyelitis with psoas abscess.  Patient was admitted for further evaluation and orthopedics is consulted.   Significant studies: 6/4>> TEE: EF 60-65%, 1.8 x 1.2 mass right atrial wall- vegetation from old HD catheter impinged on RA wall-versus mass/tumor. 8/4>> MRI pelvis: Discitis/osteomyelitis L4-5-right psoas abscess. 8/5>> CT abdomen/pelvis: Discitis/osteomyelitis L4-5 with paraspinal inflammatory changes/right psoas abscess.  8/8>> MRI LS spine: L4-5 discitis/osteomyelitis with associated paraspinal inflammatory changes, right psoas abscess. 8/20>> MRI left hip: Discitis of the lower lumbar spine with extension into the psoas muscle. 8/20>> MRI left knee: BKA-no significant abnormality. 8/25>> MRI LS spine: Persistent/ongoing changes of osteomyelitis at L4/L5 with associated epidural phlegmon/abscess within the ventral epidural space at L4/L5.  Associated multiloculated bilateral psoas abscess measuring up to 3.1 cm on the left.   Significant microbiology data: 5/28>> blood culture: MRSA 5/29>> left finger abscess: E. coli/Streptococcus anginosus, Bacteroides fragilis. 8/4>> blood culture: No growth 8/27 >> L4-L5 disc aspirate culture: MRSA.     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 coli on bone culture) As per prior notes,  Patient is not a surgical candidate. IR was able to aspirate from L4-L5 >positive for MRSA on 01/07/24. As per last ID note from Dr. Overton, recommended 6 weeks of IV vancomycin  from 8/27 -EOT around 02/18/24. Will need reimaging at some point-closer to antibiotic EOT ~10/1.   Chronic hypotension: Continue midodrine    ESRD on HD MWF: Nephrology following.  Next hemodialysis tomorrow..   Normocytic anemia: Secondary to ESRD Hb stable. Continue Aranesp /iron  defer to nephrology service.   PAD-s/p bilateral BKA: Normally uses prosthesis. Telling some staff that he 'hasn't walked in years' . Renal navigator seeking collateral information from dialysis center. Due to severe pain-He has been refusing to mobilize with PT  Note-has been refusing physical therapy-multiple times-they have signed off.   HIV (CD4 254 on 5/29) Continue HAART.   Chronic HCV: Outpatient follow-up with infectious disease.   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 Continue moxifloxacin eyedrops twice daily OD, ophthalmic lubricant 4 times daily OD.   Chronic intrahepatic /extrahepatic biliary dilatation: Incidental finding Per radiology-similar to prior CTs.   Difficult disposition: Lives alone, has no friends, unclear if he has family-uninsured-SNF not a option per SW-history of ESRD with bilateral BKA-currently claims unable to ambulate with his prostheses, can't bring prostheses-hence unable to be discharged home safely.   Patient has been refusing PT/OT-multiple  times-has resolved-PT/OT have signed off. Normally-prior to this hospitalization was able to ambulate  with prosthesis and catch the bus/scat transport to go back and forth from dialysis center.    DM-2 (A1c 10.0 on 3/6) CBG stable. Continue SSI    DVT prophylaxis: Heparin  sq Code Status: Full code Family Communication: No family at bed side. Disposition Plan:    Status is: Inpatient Remains inpatient appropriate because: Severity of illness.   Consultants:  ID/nephrology/ophthalmology/interventional radiology.    Procedures:   8/27 >> IR aspiration 8/27 of of L4-L5 disc aspirate  Antimicrobials:  Anti-infectives (From admission, onward)    Start     Dose/Rate Route Frequency Ordered Stop   01/26/24 1800  bictegravir-emtricitabine -tenofovir  AF (BIKTARVY ) 50-200-25 MG per tablet 1 tablet        1 tablet Oral Daily 01/26/24 0853     01/06/24 1800  ceFEPIme  (MAXIPIME ) 2 g in sodium chloride  0.9 % 100 mL IVPB  Status:  Discontinued        2 g 200 mL/hr over 30 Minutes Intravenous Every T-Th-Sa (1800) 01/05/24 1131 01/08/24 1053   01/06/24 1200  vancomycin  (VANCOREADY) IVPB 500 mg/100 mL        500 mg 100 mL/hr over 60 Minutes Intravenous Every T-Th-Sa (Hemodialysis) 01/05/24 1130     01/03/24 1200  vancomycin  (VANCOREADY) IVPB 500 mg/100 mL        500 mg 100 mL/hr over 60 Minutes Intravenous  Once 01/02/24 1052 01/03/24 1414   01/03/24 1200  ceFEPIme  (MAXIPIME ) 2 g in sodium chloride  0.9 % 100 mL IVPB        2 g 200 mL/hr over 30 Minutes Intravenous  Once 01/02/24 1212 01/03/24 1343   01/02/24 1200  ceFEPIme  (MAXIPIME ) 2 g in sodium chloride  0.9 % 100 mL IVPB  Status:  Discontinued        2 g 200 mL/hr over 30 Minutes Intravenous Every M-W-F (Hemodialysis) 12/31/23 1221 01/05/24 1131   01/02/24 1200  vancomycin  (VANCOREADY) IVPB 500 mg/100 mL  Status:  Discontinued        500 mg 100 mL/hr over 60 Minutes Intravenous Every M-W-F (Hemodialysis) 12/31/23 1221 01/05/24 1130    01/01/24 1200  vancomycin  (VANCOREADY) IVPB 500 mg/100 mL        500 mg 100 mL/hr over 60 Minutes Intravenous Every T-Th-Sa (Hemodialysis) 12/31/23 1219 01/01/24 1609   01/01/24 1200  ceFEPIme  (MAXIPIME ) 2 g in sodium chloride  0.9 % 100 mL IVPB        2 g 200 mL/hr over 30 Minutes Intravenous Once in dialysis 12/31/23 1219 01/01/24 1840   12/30/23 1400  vancomycin  (VANCOREADY) IVPB 500 mg/100 mL        500 mg 100 mL/hr over 60 Minutes Intravenous  Once 12/30/23 1310 12/30/23 2033   12/30/23 1400  ceFEPIme  (MAXIPIME ) 1 g in sodium chloride  0.9 % 100 mL IVPB        1 g 200 mL/hr over 30 Minutes Intravenous  Once 12/30/23 1310 12/30/23 2005   12/29/23 2230  ceFEPIme  (MAXIPIME ) 1 g in sodium chloride  0.9 % 100 mL IVPB        1 g 200 mL/hr over 30 Minutes Intravenous  Once 12/29/23 2131 12/30/23 0656   12/29/23 1200  vancomycin  (VANCOREADY) IVPB 500 mg/100 mL  Status:  Discontinued        500 mg 100 mL/hr over 60 Minutes Intravenous Every M-W-F (Hemodialysis) 12/25/23 1313 12/26/23 1056   12/29/23 1200  ceFEPIme  (MAXIPIME ) 2 g in sodium chloride  0.9 % 100 mL IVPB  Status:  Discontinued  2 g 200 mL/hr over 30 Minutes Intravenous Every M-W-F (Hemodialysis) 12/26/23 1056 12/31/23 1221   12/29/23 1200  vancomycin  (VANCOREADY) IVPB 500 mg/100 mL  Status:  Discontinued        500 mg 100 mL/hr over 60 Minutes Intravenous Every M-W-F (Hemodialysis) 12/26/23 1056 12/31/23 1221   12/27/23 1400  ceFEPIme  (MAXIPIME ) 2 g in sodium chloride  0.9 % 100 mL IVPB        2 g 200 mL/hr over 30 Minutes Intravenous  Once 12/27/23 1319 12/27/23 1443   12/27/23 1200  vancomycin  (VANCOREADY) IVPB 500 mg/100 mL  Status:  Discontinued        500 mg 100 mL/hr over 60 Minutes Intravenous Every Sat (Hemodialysis) 12/25/23 1313 12/26/23 1056   12/27/23 1200  ceFEPIme  (MAXIPIME ) 2 g in sodium chloride  0.9 % 100 mL IVPB  Status:  Discontinued        2 g 200 mL/hr over 30 Minutes Intravenous Every Sat (Hemodialysis)  12/26/23 1056 12/27/23 1318   12/27/23 1200  vancomycin  (VANCOREADY) IVPB 500 mg/100 mL        500 mg 100 mL/hr over 60 Minutes Intravenous Every Sat (Hemodialysis) 12/26/23 1056 12/27/23 1227   12/26/23 1700  ciprofloxacin  (CIPRO ) tablet 500 mg        500 mg Oral  Once 12/26/23 1056 12/26/23 1704   12/25/23 1915  ciprofloxacin  (CIPRO ) tablet 500 mg        500 mg Oral  Once 12/25/23 1819 12/25/23 1849   12/25/23 1800  ceFEPIme  (MAXIPIME ) 1 g in sodium chloride  0.9 % 100 mL IVPB  Status:  Discontinued        1 g 200 mL/hr over 30 Minutes Intravenous Every 24 hours 12/25/23 1316 12/26/23 1056   12/25/23 1200  ceFEPIme  (MAXIPIME ) 2 g in sodium chloride  0.9 % 100 mL IVPB  Status:  Discontinued        2 g 200 mL/hr over 30 Minutes Intravenous Once in dialysis 12/24/23 1739 12/25/23 1316   12/25/23 1200  vancomycin  (VANCOCIN ) IVPB 1000 mg/200 mL premix  Status:  Discontinued        1,000 mg 200 mL/hr over 60 Minutes Intravenous  Once 12/24/23 1739 12/24/23 1809   12/25/23 1200  vancomycin  (VANCOREADY) IVPB 500 mg/100 mL  Status:  Discontinued        500 mg 100 mL/hr over 60 Minutes Intravenous  Once 12/24/23 1809 12/25/23 1313   12/24/23 1200  ceFEPIme  (MAXIPIME ) 2 g in sodium chloride  0.9 % 100 mL IVPB  Status:  Discontinued        2 g 200 mL/hr over 30 Minutes Intravenous Every M-W-F (Hemodialysis) 12/22/23 0851 12/25/23 1316   12/19/23 0000  ceFEPime  (MAXIPIME ) IVPB        2 g Intravenous Every M-W-F (Hemodialysis) 12/19/23 1300 02/09/24 2359   12/19/23 0000  vancomycin  IVPB        500 mg Intravenous Every M-W-F (Hemodialysis) 12/19/23 1300 02/09/24 2359   12/16/23 2200  ceFEPIme  (MAXIPIME ) 1 g in sodium chloride  0.9 % 100 mL IVPB        1 g 200 mL/hr over 30 Minutes Intravenous Every 24 hours 12/15/23 1348 12/22/23 2203   12/15/23 2230  vancomycin  (VANCOREADY) IVPB 500 mg/100 mL  Status:  Discontinued        500 mg 100 mL/hr over 60 Minutes Intravenous Every M-W-F (Hemodialysis)  12/15/23 2217 12/25/23 1313   12/15/23 1400  bictegravir-emtricitabine -tenofovir  AF (BIKTARVY ) 50-200-25 MG per tablet 1 tablet  Status:  Discontinued        1 tablet Oral Daily 12/15/23 1307 01/26/24 0853   12/15/23 1347  vancomycin  variable dose per unstable renal function (pharmacist dosing)  Status:  Discontinued         Does not apply See admin instructions 12/15/23 1348 12/15/23 2218   12/15/23 1100  ceFEPIme  (MAXIPIME ) 2 g in sodium chloride  0.9 % 100 mL IVPB        2 g 200 mL/hr over 30 Minutes Intravenous  Once 12/15/23 1048 12/15/23 1130   12/15/23 1100  vancomycin  (VANCOCIN ) IVPB 1000 mg/200 mL premix        1,000 mg 200 mL/hr over 60 Minutes Intravenous  Once 12/15/23 1048 12/15/23 1154       Subjective: Patient was seen and examined at bedside.  Overnight events noted. Spanish interpreter [facility] # [phone] used for interpretation. Patient wants his pain to be well-controlled before he can call someone to bring his prosthesis.    Objective: Vitals:   02/05/24 1233 02/05/24 1700 02/05/24 2141 02/06/24 0804  BP: 139/77 (!) 130/90 (!) 141/85 118/84  Pulse: 90 90 90   Resp: (!) 9 18 18 16   Temp: (!) 97.2 F (36.2 C) 97.7 F (36.5 C) 97.8 F (36.6 C)   TempSrc:   Oral   SpO2: 100% 98% 98%   Weight:      Height:        Intake/Output Summary (Last 24 hours) at 02/06/2024 1353 Last data filed at 02/06/2024 0900 Gross per 24 hour  Intake 480 ml  Output --  Net 480 ml   Filed Weights   02/03/24 0820 02/03/24 1200 02/05/24 0832  Weight: 40.5 kg 40.4 kg 42.2 kg    Examination:  General exam: Appears calm and comfortable, deconditioned, not in any acute distress. Respiratory system: CTA Bilaterally. Respiratory effort normal.  RR 13 Cardiovascular system: S1 & S2 heard, RRR. No JVD, murmurs, rubs, gallops or clicks.  Gastrointestinal system: Abdomen is non distended, soft and non tender. Normal bowel sounds heard. Central nervous system: Alert and oriented x 3. No  focal neurological deficits. Extremities: Bilateral BKA Skin: No rashes, lesions or ulcers Psychiatry: Judgement and insight appear normal. Mood & affect appropriate.   Data Reviewed: I have personally reviewed following labs and imaging studies  CBC: Recent Labs  Lab 02/01/24 0309 02/05/24 0557  WBC 8.0 5.5  HGB 9.7* 10.5*  HCT 29.9* 33.0*  MCV 97.4 99.1  PLT 162 162   Basic Metabolic Panel: Recent Labs  Lab 02/01/24 0309 02/05/24 0557  NA 129* 131*  K 3.5 4.0  CL 93* 94*  CO2 23 22  GLUCOSE 163* 123*  BUN 18 29*  CREATININE 2.77* 3.64*  CALCIUM  9.1 9.3  PHOS 2.7  --    GFR: Estimated Creatinine Clearance: 14 mL/min (A) (by C-G formula based on SCr of 3.64 mg/dL (H)). Liver Function Tests: Recent Labs  Lab 02/01/24 0309  ALBUMIN  2.4*   No results for input(s): LIPASE, AMYLASE in the last 168 hours. No results for input(s): AMMONIA in the last 168 hours. Coagulation Profile: No results for input(s): INR, PROTIME in the last 168 hours. Cardiac Enzymes: No results for input(s): CKTOTAL, CKMB, CKMBINDEX, TROPONINI in the last 168 hours. BNP (last 3 results) No results for input(s): PROBNP in the last 8760 hours. HbA1C: No results for input(s): HGBA1C in the last 72 hours. CBG: Recent Labs  Lab 02/05/24 1402 02/05/24 1643 02/05/24 2234 02/06/24 0627 02/06/24 1114  GLUCAP 86  88 119* 122* 128*   Lipid Profile: No results for input(s): CHOL, HDL, LDLCALC, TRIG, CHOLHDL, LDLDIRECT in the last 72 hours. Thyroid Function Tests: No results for input(s): TSH, T4TOTAL, FREET4, T3FREE, THYROIDAB in the last 72 hours. Anemia Panel: No results for input(s): VITAMINB12, FOLATE, FERRITIN, TIBC, IRON , RETICCTPCT in the last 72 hours. Sepsis Labs: No results for input(s): PROCALCITON, LATICACIDVEN in the last 168 hours.  No results found for this or any previous visit (from the past 240 hours).   Radiology  Studies: No results found.  Scheduled Meds:  (feeding supplement) PROSource Plus  30 mL Oral Daily   acetaminophen   1,000 mg Oral TID   artificial tears   Right Eye QID   bictegravir-emtricitabine -tenofovir  AF  1 tablet Oral Daily   Chlorhexidine  Gluconate Cloth  6 each Topical Q0600   darbepoetin (ARANESP ) injection - DIALYSIS  150 mcg Subcutaneous Q Fri-1800   feeding supplement (NEPRO CARB STEADY)  237 mL Oral BID AC & HS   fentaNYL   1 patch Transdermal Q72H   gabapentin   200 mg Oral TID   gatifloxacin   1 drop Right Eye BID   heparin   5,000 Units Subcutaneous Q8H   insulin  aspart  0-5 Units Subcutaneous QHS   insulin  aspart  0-6 Units Subcutaneous TID WC   lidocaine   2 patch Transdermal Q24H   methocarbamol   500 mg Oral TID   midodrine   10 mg Oral TID WC   midodrine   10 mg Oral Q T,Th,Sa-HD   multivitamin  1 tablet Oral QHS   pantoprazole   40 mg Oral BID AC   polyethylene glycol  17 g Oral Daily   sevelamer  carbonate  800 mg Oral TID WC   Continuous Infusions:  vancomycin  500 mg (02/05/24 1735)     LOS: 53 days    Time spent: 35 mins    Darcel Dawley, MD [facility]   If 7PM-7AM, please contact night-coverage

## 2024-02-07 LAB — BASIC METABOLIC PANEL WITH GFR
Anion gap: 13 (ref 5–15)
BUN: 24 mg/dL — ABNORMAL HIGH (ref 6–20)
CO2: 25 mmol/L (ref 22–32)
Calcium: 9.4 mg/dL (ref 8.9–10.3)
Chloride: 94 mmol/L — ABNORMAL LOW (ref 98–111)
Creatinine, Ser: 3.05 mg/dL — ABNORMAL HIGH (ref 0.61–1.24)
GFR, Estimated: 24 mL/min — ABNORMAL LOW (ref >60)
Glucose, Bld: 105 mg/dL — ABNORMAL HIGH (ref 70–99)
Potassium: 4 mmol/L (ref 3.5–5.1)
Sodium: 132 mmol/L — ABNORMAL LOW (ref 135–145)

## 2024-02-07 LAB — CBC
HCT: 32.1 % — ABNORMAL LOW (ref 39.0–52.0)
Hemoglobin: 10.3 g/dL — ABNORMAL LOW (ref 13.0–17.0)
MCH: 31.9 pg (ref 26.0–34.0)
MCHC: 32.1 g/dL (ref 30.0–36.0)
MCV: 99.4 fL (ref 80.0–100.0)
Platelets: 158 K/uL (ref 150–400)
RBC: 3.23 MIL/uL — ABNORMAL LOW (ref 4.22–5.81)
RDW: 19.1 % — ABNORMAL HIGH (ref 11.5–15.5)
WBC: 5 K/uL (ref 4.0–10.5)
nRBC: 0 % (ref 0.0–0.2)

## 2024-02-07 LAB — MAGNESIUM: Magnesium: 2.2 mg/dL (ref 1.7–2.4)

## 2024-02-07 LAB — GLUCOSE, CAPILLARY
Glucose-Capillary: 99 mg/dL (ref 70–99)
Glucose-Capillary: 73 mg/dL (ref 70–99)
Glucose-Capillary: 109 mg/dL — ABNORMAL HIGH (ref 70–99)
Glucose-Capillary: 101 mg/dL — ABNORMAL HIGH (ref 70–99)

## 2024-02-07 LAB — PHOSPHORUS: Phosphorus: 3 mg/dL (ref 2.5–4.6)

## 2024-02-07 MED ORDER — MIDODRINE HCL 5 MG PO TABS
ORAL_TABLET | ORAL | Status: AC
  Filled 2024-02-07: qty 2

## 2024-02-07 MED ORDER — HEPARIN SODIUM (PORCINE) 1000 UNIT/ML DIALYSIS
40.0000 [IU]/kg | INTRAMUSCULAR | Status: DC | PRN
  Administered 2024-02-07: 3200 [IU] via INTRAVENOUS_CENTRAL
  Administered 2024-02-07: 1700 [IU] via INTRAVENOUS_CENTRAL

## 2024-02-07 MED ORDER — OXYCODONE HCL 5 MG PO TABS
ORAL_TABLET | ORAL | Status: AC
  Filled 2024-02-07: qty 10

## 2024-02-07 MED ORDER — HEPARIN SODIUM (PORCINE) 1000 UNIT/ML IJ SOLN
INTRAMUSCULAR | Status: AC
  Filled 2024-02-07: qty 4

## 2024-02-07 MED ORDER — ALBUMIN HUMAN 25 % IV SOLN
25.0000 g | Freq: Once | INTRAVENOUS | Status: AC
  Administered 2024-02-07: 25 g via INTRAVENOUS

## 2024-02-07 MED ORDER — HEPARIN SODIUM (PORCINE) 1000 UNIT/ML IJ SOLN
INTRAMUSCULAR | Status: AC
  Filled 2024-02-07: qty 2

## 2024-02-07 MED ORDER — ALBUMIN HUMAN 25 % IV SOLN
INTRAVENOUS | Status: AC
  Filled 2024-02-07: qty 100

## 2024-02-07 NOTE — Progress Notes (Signed)
 Received patient in bed to unit.  Alert and oriented.  Informed consent signed and in chart.   TX duration:3.5 hours  Patient tolerated well.  Transported back to the room  Alert, without acute distress.  Hand-off given to patient's nurse.   Access used: Right internal jugular HD cath Access issues: none  Total UF removed: Medication(s) given: Albumin , midodrine , Tylenol , Oxycodone    02/07/24 1217  Vitals  Temp 98 F (36.7 C)  BP 99/71  Pulse Rate 86  Resp 17  Oxygen Therapy  SpO2 100 %  O2 Device Room Air  Patient Activity (if Appropriate) In bed  Pulse Oximetry Type Continuous  Oximetry Probe Site Changed No  Post Treatment  Dialyzer Clearance Lightly streaked  Liters Processed 78  Fluid Removed (mL) 900 mL  Tolerated HD Treatment Yes  Hemodialysis Catheter Right Internal jugular Double lumen Permanent (Tunneled)  Placement Date/Time: 11/11/23 1428   Serial / Lot #: [id number]  Expiration Date: 04/14/28  Time Out: Correct patient;Correct site;Correct procedure  Maximum sterile barrier precautions: Hand hygiene;Cap;Mask;Sterile gown;Sterile gloves;Large sterile ...  Site Condition No complications  Blue Lumen Status Flushed;Antimicrobial dead end cap;Dead end cap in place  Red Lumen Status Flushed;Antimicrobial dead end cap;Dead end cap in place  Purple Lumen Status N/A  Catheter fill solution Heparin  1000 units/ml  Catheter fill volume (Arterial) 1.6 cc  Catheter fill volume (Venous) 1.6  Dressing Type Transparent  Dressing Status Antimicrobial disc/dressing in place;Clean, Dry, Intact  Drainage Description None  Dressing Change Due 02/14/24     Camellia Brasil LPN [facility]

## 2024-02-07 NOTE — Procedures (Signed)
 Patient seen on Hemodialysis. BP (!) 101/59   Pulse 78   Temp 98 F (36.7 C)   Resp 13   Ht 4' 11 (1.499 m)   Wt 42.9 kg   SpO2 97%   BMI 19.10 kg/m   QB 400, UF goal 1L Tolerating treatment without complaints at this time.   Gordy Blanch MD [facility]. Office # [phone] Pager # [phone] 10:29 AM

## 2024-02-07 NOTE — Progress Notes (Signed)
 PROGRESS NOTE    Alvin Daniels  FMW:[id number] DOB: 03-02-1971 DOA: 12/14/2023 PCP: Pcp, No   Brief Narrative:  This 53 yrs old male with PMH significant for PAD s/p bilateral BKA, ESRD on HD, HIV, chronic HCV-recent hospitalization from 5/28-6/6 for MRSA bacteremia with left fourth finger osteomyelitis (s/p amputation 5/29)-presented to the hospital with right hip pain. Patient was found to have L4-L5 osteomyelitis with psoas abscess.  Patient was admitted for further evaluation and orthopedics is consulted.   Significant studies: 6/4>> TEE: EF 60-65%, 1.8 x 1.2 mass right atrial wall- vegetation from old HD catheter impinged on RA wall-versus mass/tumor. 8/4>> MRI pelvis: Discitis/osteomyelitis L4-5-right psoas abscess. 8/5>> CT abdomen/pelvis: Discitis/osteomyelitis L4-5 with paraspinal inflammatory changes/right psoas abscess.  8/8>> MRI LS spine: L4-5 discitis/osteomyelitis with associated paraspinal inflammatory changes, right psoas abscess. 8/20>> MRI left hip: Discitis of the lower lumbar spine with extension into the psoas muscle. 8/20>> MRI left knee: BKA-no significant abnormality. 8/25>> MRI LS spine: Persistent/ongoing changes of osteomyelitis at L4/L5 with associated epidural phlegmon/abscess within the ventral epidural space at L4/L5.  Associated multiloculated bilateral psoas abscess measuring up to 3.1 cm on the left.   Significant microbiology data: 5/28>> blood culture: MRSA 5/29>> left finger abscess: E. coli/Streptococcus anginosus, Bacteroides fragilis. 8/4>> blood culture: No growth 8/27 >> L4-L5 disc aspirate culture: MRSA.     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 coli on bone culture) As per prior notes,  Patient is not a surgical candidate. IR was able to aspirate from L4-L5 >positive for MRSA on 01/07/24. As per last ID note from Dr. Overton, recommended 6 weeks of IV vancomycin  from 8/27 -EOT around 02/18/24. Will need reimaging at some point-closer to antibiotic EOT ~10/1.   Chronic hypotension: Continue midodrine    ESRD on HD MWF: Nephrology following.  Next hemodialysis today  Normocytic anemia: Secondary to ESRD Hb stable. Continue Aranesp /iron  defer to nephrology service.   PAD-s/p bilateral BKA: Normally uses prosthesis. Telling some staff that he 'hasn't walked in years' . Renal navigator seeking collateral information from dialysis center. Due to severe pain-He has been refusing to mobilize with PT  Note-has been refusing physical therapy-multiple times-they have signed off.   HIV (CD4 254 on 5/29) Continue HAART.   Chronic HCV: Outpatient follow-up with infectious disease.   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 Continue moxifloxacin eyedrops twice daily OD, ophthalmic lubricant 4 times daily OD.   Chronic intrahepatic /extrahepatic biliary dilatation: Incidental finding Per radiology-similar to prior CTs.   Difficult disposition: Lives alone, has no friends, unclear if he has family-uninsured-SNF not a option per SW-history of ESRD with bilateral BKA-currently claims unable to ambulate with his prostheses, can't bring prostheses-hence unable to be discharged home safely.   Patient has been refusing PT/OT-multiple times-has  resolved-PT/OT have signed off. Normally-prior to this hospitalization was able to ambulate with  prosthesis and catch the bus/scat transport to go back and forth from dialysis center.    DM-2 (A1c 10.0 on 3/6) CBG stable. Continue SSI    DVT prophylaxis: Heparin  sq Code Status: Full code Family Communication: No family at bed side. Disposition Plan:    Status is: Inpatient Remains inpatient appropriate because: Severity of illness.   Consultants:  ID/nephrology/ophthalmology/interventional radiology.    Procedures:   8/27 >> IR aspiration 8/27 of of L4-L5 disc aspirate  Antimicrobials:  Anti-infectives (From admission, onward)    Start     Dose/Rate Route Frequency Ordered Stop   01/26/24 1800  bictegravir-emtricitabine -tenofovir  AF (BIKTARVY ) 50-200-25 MG per tablet 1 tablet        1 tablet Oral Daily 01/26/24 0853     01/06/24 1800  ceFEPIme  (MAXIPIME ) 2 g in sodium chloride  0.9 % 100 mL IVPB  Status:  Discontinued        2 g 200 mL/hr over 30 Minutes Intravenous Every T-Th-Sa (1800) 01/05/24 1131 01/08/24 1053   01/06/24 1200  vancomycin  (VANCOREADY) IVPB 500 mg/100 mL        500 mg 100 mL/hr over 60 Minutes Intravenous Every T-Th-Sa (Hemodialysis) 01/05/24 1130     01/03/24 1200  vancomycin  (VANCOREADY) IVPB 500 mg/100 mL        500 mg 100 mL/hr over 60 Minutes Intravenous  Once 01/02/24 1052 01/03/24 1414   01/03/24 1200  ceFEPIme  (MAXIPIME ) 2 g in sodium chloride  0.9 % 100 mL IVPB        2 g 200 mL/hr over 30 Minutes Intravenous  Once 01/02/24 1212 01/03/24 1343   01/02/24 1200  ceFEPIme  (MAXIPIME ) 2 g in sodium chloride  0.9 % 100 mL IVPB  Status:  Discontinued        2 g 200 mL/hr over 30 Minutes Intravenous Every M-W-F (Hemodialysis) 12/31/23 1221 01/05/24 1131   01/02/24 1200  vancomycin  (VANCOREADY) IVPB 500 mg/100 mL  Status:  Discontinued        500 mg 100 mL/hr over 60 Minutes Intravenous Every M-W-F (Hemodialysis) 12/31/23 1221 01/05/24 1130   01/01/24  1200  vancomycin  (VANCOREADY) IVPB 500 mg/100 mL        500 mg 100 mL/hr over 60 Minutes Intravenous Every T-Th-Sa (Hemodialysis) 12/31/23 1219 01/01/24 1609   01/01/24 1200  ceFEPIme  (MAXIPIME ) 2 g in sodium chloride  0.9 % 100 mL IVPB        2 g 200 mL/hr over 30 Minutes Intravenous Once in dialysis 12/31/23 1219 01/01/24 1840   12/30/23 1400  vancomycin  (VANCOREADY) IVPB 500 mg/100 mL        500 mg 100 mL/hr over 60 Minutes Intravenous  Once 12/30/23 1310 12/30/23 2033   12/30/23 1400  ceFEPIme  (MAXIPIME ) 1 g in sodium chloride  0.9 % 100 mL IVPB        1 g 200 mL/hr over 30 Minutes Intravenous  Once 12/30/23 1310 12/30/23 2005   12/29/23 2230  ceFEPIme  (MAXIPIME ) 1 g in sodium chloride  0.9 % 100 mL IVPB        1 g 200 mL/hr over 30 Minutes Intravenous  Once 12/29/23 2131 12/30/23 0656   12/29/23 1200  vancomycin  (VANCOREADY) IVPB 500 mg/100 mL  Status:  Discontinued        500 mg 100 mL/hr over 60 Minutes Intravenous Every M-W-F (Hemodialysis) 12/25/23 1313 12/26/23 1056   12/29/23 1200  ceFEPIme  (MAXIPIME ) 2 g in sodium chloride  0.9 % 100 mL IVPB  Status:  Discontinued  2 g 200 mL/hr over 30 Minutes Intravenous Every M-W-F (Hemodialysis) 12/26/23 1056 12/31/23 1221   12/29/23 1200  vancomycin  (VANCOREADY) IVPB 500 mg/100 mL  Status:  Discontinued        500 mg 100 mL/hr over 60 Minutes Intravenous Every M-W-F (Hemodialysis) 12/26/23 1056 12/31/23 1221   12/27/23 1400  ceFEPIme  (MAXIPIME ) 2 g in sodium chloride  0.9 % 100 mL IVPB        2 g 200 mL/hr over 30 Minutes Intravenous  Once 12/27/23 1319 12/27/23 1443   12/27/23 1200  vancomycin  (VANCOREADY) IVPB 500 mg/100 mL  Status:  Discontinued        500 mg 100 mL/hr over 60 Minutes Intravenous Every Sat (Hemodialysis) 12/25/23 1313 12/26/23 1056   12/27/23 1200  ceFEPIme  (MAXIPIME ) 2 g in sodium chloride  0.9 % 100 mL IVPB  Status:  Discontinued        2 g 200 mL/hr over 30 Minutes Intravenous Every Sat (Hemodialysis) 12/26/23  1056 12/27/23 1318   12/27/23 1200  vancomycin  (VANCOREADY) IVPB 500 mg/100 mL        500 mg 100 mL/hr over 60 Minutes Intravenous Every Sat (Hemodialysis) 12/26/23 1056 12/27/23 1227   12/26/23 1700  ciprofloxacin  (CIPRO ) tablet 500 mg        500 mg Oral  Once 12/26/23 1056 12/26/23 1704   12/25/23 1915  ciprofloxacin  (CIPRO ) tablet 500 mg        500 mg Oral  Once 12/25/23 1819 12/25/23 1849   12/25/23 1800  ceFEPIme  (MAXIPIME ) 1 g in sodium chloride  0.9 % 100 mL IVPB  Status:  Discontinued        1 g 200 mL/hr over 30 Minutes Intravenous Every 24 hours 12/25/23 1316 12/26/23 1056   12/25/23 1200  ceFEPIme  (MAXIPIME ) 2 g in sodium chloride  0.9 % 100 mL IVPB  Status:  Discontinued        2 g 200 mL/hr over 30 Minutes Intravenous Once in dialysis 12/24/23 1739 12/25/23 1316   12/25/23 1200  vancomycin  (VANCOCIN ) IVPB 1000 mg/200 mL premix  Status:  Discontinued        1,000 mg 200 mL/hr over 60 Minutes Intravenous  Once 12/24/23 1739 12/24/23 1809   12/25/23 1200  vancomycin  (VANCOREADY) IVPB 500 mg/100 mL  Status:  Discontinued        500 mg 100 mL/hr over 60 Minutes Intravenous  Once 12/24/23 1809 12/25/23 1313   12/24/23 1200  ceFEPIme  (MAXIPIME ) 2 g in sodium chloride  0.9 % 100 mL IVPB  Status:  Discontinued        2 g 200 mL/hr over 30 Minutes Intravenous Every M-W-F (Hemodialysis) 12/22/23 0851 12/25/23 1316   12/19/23 0000  ceFEPime  (MAXIPIME ) IVPB        2 g Intravenous Every M-W-F (Hemodialysis) 12/19/23 1300 02/09/24 2359   12/19/23 0000  vancomycin  IVPB        500 mg Intravenous Every M-W-F (Hemodialysis) 12/19/23 1300 02/09/24 2359   12/16/23 2200  ceFEPIme  (MAXIPIME ) 1 g in sodium chloride  0.9 % 100 mL IVPB        1 g 200 mL/hr over 30 Minutes Intravenous Every 24 hours 12/15/23 1348 12/22/23 2203   12/15/23 2230  vancomycin  (VANCOREADY) IVPB 500 mg/100 mL  Status:  Discontinued        500 mg 100 mL/hr over 60 Minutes Intravenous Every M-W-F (Hemodialysis) 12/15/23 2217  12/25/23 1313   12/15/23 1400  bictegravir-emtricitabine -tenofovir  AF (BIKTARVY ) 50-200-25 MG per tablet 1 tablet  Status:  Discontinued        1 tablet Oral Daily 12/15/23 1307 01/26/24 0853   12/15/23 1347  vancomycin  variable dose per unstable renal function (pharmacist dosing)  Status:  Discontinued         Does not apply See admin instructions 12/15/23 1348 12/15/23 2218   12/15/23 1100  ceFEPIme  (MAXIPIME ) 2 g in sodium chloride  0.9 % 100 mL IVPB        2 g 200 mL/hr over 30 Minutes Intravenous  Once 12/15/23 1048 12/15/23 1130   12/15/23 1100  vancomycin  (VANCOCIN ) IVPB 1000 mg/200 mL premix        1,000 mg 200 mL/hr over 60 Minutes Intravenous  Once 12/15/23 1048 12/15/23 1154       Subjective: Patient was seen and examined at HD suite.  Overnight events noted. Patient wants his pain to be well-controlled before he can call someone to bring his prosthesis.  Objective: Vitals:   02/07/24 0948 02/07/24 1000 02/07/24 1030 02/07/24 1100  BP: (!) 79/52 (!) 101/59 (!) 89/65 112/66  Pulse: 76 78 78 76  Resp: 12 13 13 11   Temp:      TempSrc:      SpO2: 96% 97% 99% 99%  Weight:      Height:        Intake/Output Summary (Last 24 hours) at 02/07/2024 1110 Last data filed at 02/06/2024 1654 Gross per 24 hour  Intake 240 ml  Output --  Net 240 ml   Filed Weights   02/03/24 1200 02/05/24 0832 02/07/24 0836  Weight: 40.4 kg 42.2 kg 42.9 kg    Examination:  General exam: Appears calm and comfortable, deconditioned, not in any acute distress. Respiratory system: CTA Bilaterally. Respiratory effort normal.  RR 14 Cardiovascular system: S1 & S2 heard, RRR. No JVD, murmurs, rubs, gallops or clicks.  Gastrointestinal system: Abdomen is non distended, soft and non tender. Normal bowel sounds heard. Central nervous system: Alert and oriented x 3. No focal neurological deficits. Extremities: Bilateral BKA. Skin: No rashes, lesions or ulcers Psychiatry: Judgement and insight appear  normal. Mood & affect appropriate.   Data Reviewed: I have personally reviewed following labs and imaging studies  CBC: Recent Labs  Lab 02/01/24 0309 02/05/24 0557 02/07/24 0607  WBC 8.0 5.5 5.0  HGB 9.7* 10.5* 10.3*  HCT 29.9* 33.0* 32.1*  MCV 97.4 99.1 99.4  PLT 162 162 158   Basic Metabolic Panel: Recent Labs  Lab 02/01/24 0309 02/05/24 0557 02/07/24 0607  NA 129* 131* 132*  K 3.5 4.0 4.0  CL 93* 94* 94*  CO2 23 22 25   GLUCOSE 163* 123* 105*  BUN 18 29* 24*  CREATININE 2.77* 3.64* 3.05*  CALCIUM  9.1 9.3 9.4  MG  --   --  2.2  PHOS 2.7  --  3.0   GFR: Estimated Creatinine Clearance: 17 mL/min (A) (by C-G formula based on SCr of 3.05 mg/dL (H)). Liver Function Tests: Recent Labs  Lab 02/01/24 0309  ALBUMIN  2.4*   No results for input(s): LIPASE, AMYLASE in the last 168 hours. No results for input(s): AMMONIA in the last 168 hours. Coagulation Profile: No results for input(s): INR, PROTIME in the last 168 hours. Cardiac Enzymes: No results for input(s): CKTOTAL, CKMB, CKMBINDEX, TROPONINI in the last 168 hours. BNP (last 3 results) No results for input(s): PROBNP in the last 8760 hours. HbA1C: No results for input(s): HGBA1C in the last 72 hours. CBG: Recent Labs  Lab 02/06/24 [id number] 02/06/24 1114 02/06/24  1640 02/06/24 2108 02/07/24 0506  GLUCAP 122* 128* 127* 101* 99   Lipid Profile: No results for input(s): CHOL, HDL, LDLCALC, TRIG, CHOLHDL, LDLDIRECT in the last 72 hours. Thyroid Function Tests: No results for input(s): TSH, T4TOTAL, FREET4, T3FREE, THYROIDAB in the last 72 hours. Anemia Panel: No results for input(s): VITAMINB12, FOLATE, FERRITIN, TIBC, IRON , RETICCTPCT in the last 72 hours. Sepsis Labs: No results for input(s): PROCALCITON, LATICACIDVEN in the last 168 hours.  No results found for this or any previous visit (from the past 240 hours).   Radiology Studies: No  results found.  Scheduled Meds:  (feeding supplement) PROSource Plus  30 mL Oral Daily   acetaminophen   1,000 mg Oral TID   artificial tears   Right Eye QID   bictegravir-emtricitabine -tenofovir  AF  1 tablet Oral Daily   Chlorhexidine  Gluconate Cloth  6 each Topical Q0600   darbepoetin (ARANESP ) injection - DIALYSIS  150 mcg Subcutaneous Q Fri-1800   feeding supplement (NEPRO CARB STEADY)  237 mL Oral BID AC & HS   fentaNYL   1 patch Transdermal Q72H   gabapentin   200 mg Oral TID   gatifloxacin   1 drop Right Eye BID   heparin   5,000 Units Subcutaneous Q8H   insulin  aspart  0-5 Units Subcutaneous QHS   insulin  aspart  0-6 Units Subcutaneous TID WC   lidocaine   2 patch Transdermal Q24H   methocarbamol   500 mg Oral TID   midodrine   10 mg Oral TID WC   midodrine   10 mg Oral Q T,Th,Sa-HD   multivitamin  1 tablet Oral QHS   pantoprazole   40 mg Oral BID AC   polyethylene glycol  17 g Oral Daily   sevelamer  carbonate  800 mg Oral TID WC   Continuous Infusions:  vancomycin  500 mg (02/05/24 1735)     LOS: 54 days    Time spent: 35 mins    Darcel Dawley, MD [facility]   If 7PM-7AM, please contact night-coverage

## 2024-02-07 NOTE — TOC Progression Note (Signed)
 Transition of Care [facility]) - Progression Note    Patient Details  Name: Alvin Daniels MRN: [id number] Date of Birth: [birth year]  [facility] of [facility]) CM/SW Contact  Bridget Cordella Simmonds, LCSW Phone Number: 02/07/2024, 8:29 AM  Clinical Narrative:   ICM following this pt, unclear DC plan at this time.      Expected Discharge Plan: Home/Self Care Barriers to Discharge: Continued Medical Work up, Inadequate or no insurance (does not have adequate home support in place)               Expected Discharge Plan and Services In-house Referral: Clinical Social Work     Living arrangements for the past 2 months: Single Family Home Expected Discharge Date: 02/02/24                                     Social Drivers of Health (SDOH) Interventions SDOH Screenings   Food Insecurity: No Food Insecurity (12/15/2023)  Housing: Low Risk  (12/15/2023)  Transportation Needs: No Transportation Needs (12/15/2023)  Utilities: Not At Risk (12/15/2023)  Alcohol  Screen: Low Risk  (07/17/2023)  Depression (PHQ2-9): Low Risk  (07/17/2023)  Financial Resource Strain: Low Risk  (01/29/2023)  Physical Activity: Inactive (01/29/2023)  Social Connections: Moderately Isolated (10/08/2023)  Stress: No Stress Concern Present (01/29/2023)  Tobacco Use: Low Risk  (12/14/2023)  Health Literacy: Adequate Health Literacy (01/29/2023)    Readmission Risk Interventions     No data to display

## 2024-02-08 LAB — GLUCOSE, CAPILLARY
Glucose-Capillary: 80 mg/dL (ref 70–99)
Glucose-Capillary: 93 mg/dL (ref 70–99)
Glucose-Capillary: 99 mg/dL (ref 70–99)
Glucose-Capillary: 87 mg/dL (ref 70–99)

## 2024-02-08 NOTE — Progress Notes (Signed)
 PROGRESS NOTE    Alvin Daniels  FMW:[id number] DOB: 12-10-1970 DOA: 12/14/2023 PCP: Pcp, No   Brief Narrative:  This 53 yrs old male with PMH significant for PAD s/p bilateral BKA, ESRD on HD, HIV, chronic HCV-recent hospitalization from 5/28-6/6 for MRSA bacteremia with left fourth finger osteomyelitis (s/p amputation 5/29)-presented to the hospital with right hip pain. Patient was found to have L4-L5 osteomyelitis with psoas abscess.  Patient was admitted for further evaluation and orthopedics is consulted.   Significant studies: 6/4>> TEE: EF 60-65%, 1.8 x 1.2 mass right atrial wall- vegetation from old HD catheter impinged on RA wall-versus mass/tumor. 8/4>> MRI pelvis: Discitis/osteomyelitis L4-5-right psoas abscess. 8/5>> CT abdomen/pelvis: Discitis/osteomyelitis L4-5 with paraspinal inflammatory changes/right psoas abscess.  8/8>> MRI LS spine: L4-5 discitis/osteomyelitis with associated paraspinal inflammatory changes, right psoas abscess. 8/20>> MRI left hip: Discitis of the lower lumbar spine with extension into the psoas muscle. 8/20>> MRI left knee: BKA-no significant abnormality. 8/25>> MRI LS spine: Persistent/ongoing changes of osteomyelitis at L4/L5 with associated epidural phlegmon/abscess within the ventral epidural space at L4/L5.  Associated multiloculated bilateral psoas abscess measuring up to 3.1 cm on the left.   Significant microbiology data: 5/28>> blood culture: MRSA 5/29>> left finger abscess: E. coli/Streptococcus anginosus, Bacteroides fragilis. 8/4>> blood culture: No growth 8/27 >> L4-L5 disc aspirate culture: MRSA.     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 coli on bone culture) As per prior notes,  Patient is not a surgical candidate. IR was able to aspirate from L4-L5 >positive for MRSA on 01/07/24. As per last ID note from Dr. Overton, recommended 6 weeks of IV vancomycin  from 8/27 -EOT around 02/18/24. Will need reimaging at some point-closer to antibiotic EOT ~10/1.   Chronic hypotension: Continue midodrine    ESRD on HD MWF: Nephrology following.  Next hemodialysis today  Normocytic anemia: Secondary to ESRD Hb stable. Continue Aranesp /iron  defer to nephrology service.   PAD-s/p bilateral BKA: Normally uses prosthesis. Telling some staff that he 'hasn't walked in years' . Renal navigator seeking collateral information from dialysis center. Due to severe pain-He has been refusing to mobilize with PT  Note-has been refusing physical therapy-multiple times-they have signed off.   HIV (CD4 254 on 5/29) Continue HAART.   Chronic HCV: Outpatient follow-up with infectious disease.   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 Continue moxifloxacin eyedrops twice daily OD, ophthalmic lubricant 4 times daily OD.   Chronic intrahepatic /extrahepatic biliary dilatation: Incidental finding Per radiology-similar to prior CTs.   Difficult disposition: Lives alone, has no friends, unclear if he has family-uninsured-SNF not a option per SW-history of ESRD with bilateral BKA-currently claims unable to ambulate with his prostheses, can't bring prostheses-hence unable to be discharged home safely.   Patient has been refusing PT/OT-multiple times-has  resolved-PT/OT have signed off. Normally-prior to this hospitalization was able to ambulate with  prosthesis and catch the bus/scat transport to go back and forth from dialysis center.    DM-2 (A1c 10.0 on 3/6) CBG stable. Continue SSI    DVT prophylaxis: Heparin  sq Code Status: Full code Family Communication: No family at bed side. Disposition Plan:    Status is: Inpatient Remains inpatient appropriate because: Severity of illness.   Consultants:  ID/nephrology/ophthalmology/interventional radiology.    Procedures:   8/27 >> IR aspiration 8/27 of of L4-L5 disc aspirate  Antimicrobials:  Anti-infectives (From admission, onward)    Start     Dose/Rate Route Frequency Ordered Stop   01/26/24 1800  bictegravir-emtricitabine -tenofovir  AF (BIKTARVY ) 50-200-25 MG per tablet 1 tablet        1 tablet Oral Daily 01/26/24 0853     01/06/24 1800  ceFEPIme  (MAXIPIME ) 2 g in sodium chloride  0.9 % 100 mL IVPB  Status:  Discontinued        2 g 200 mL/hr over 30 Minutes Intravenous Every T-Th-Sa (1800) 01/05/24 1131 01/08/24 1053   01/06/24 1200  vancomycin  (VANCOREADY) IVPB 500 mg/100 mL        500 mg 100 mL/hr over 60 Minutes Intravenous Every T-Th-Sa (Hemodialysis) 01/05/24 1130     01/03/24 1200  vancomycin  (VANCOREADY) IVPB 500 mg/100 mL        500 mg 100 mL/hr over 60 Minutes Intravenous  Once 01/02/24 1052 01/03/24 1414   01/03/24 1200  ceFEPIme  (MAXIPIME ) 2 g in sodium chloride  0.9 % 100 mL IVPB        2 g 200 mL/hr over 30 Minutes Intravenous  Once 01/02/24 1212 01/03/24 1343   01/02/24 1200  ceFEPIme  (MAXIPIME ) 2 g in sodium chloride  0.9 % 100 mL IVPB  Status:  Discontinued        2 g 200 mL/hr over 30 Minutes Intravenous Every M-W-F (Hemodialysis) 12/31/23 1221 01/05/24 1131   01/02/24 1200  vancomycin  (VANCOREADY) IVPB 500 mg/100 mL  Status:  Discontinued        500 mg 100 mL/hr over 60 Minutes Intravenous Every M-W-F (Hemodialysis) 12/31/23 1221 01/05/24 1130   01/01/24  1200  vancomycin  (VANCOREADY) IVPB 500 mg/100 mL        500 mg 100 mL/hr over 60 Minutes Intravenous Every T-Th-Sa (Hemodialysis) 12/31/23 1219 01/01/24 1609   01/01/24 1200  ceFEPIme  (MAXIPIME ) 2 g in sodium chloride  0.9 % 100 mL IVPB        2 g 200 mL/hr over 30 Minutes Intravenous Once in dialysis 12/31/23 1219 01/01/24 1840   12/30/23 1400  vancomycin  (VANCOREADY) IVPB 500 mg/100 mL        500 mg 100 mL/hr over 60 Minutes Intravenous  Once 12/30/23 1310 12/30/23 2033   12/30/23 1400  ceFEPIme  (MAXIPIME ) 1 g in sodium chloride  0.9 % 100 mL IVPB        1 g 200 mL/hr over 30 Minutes Intravenous  Once 12/30/23 1310 12/30/23 2005   12/29/23 2230  ceFEPIme  (MAXIPIME ) 1 g in sodium chloride  0.9 % 100 mL IVPB        1 g 200 mL/hr over 30 Minutes Intravenous  Once 12/29/23 2131 12/30/23 0656   12/29/23 1200  vancomycin  (VANCOREADY) IVPB 500 mg/100 mL  Status:  Discontinued        500 mg 100 mL/hr over 60 Minutes Intravenous Every M-W-F (Hemodialysis) 12/25/23 1313 12/26/23 1056   12/29/23 1200  ceFEPIme  (MAXIPIME ) 2 g in sodium chloride  0.9 % 100 mL IVPB  Status:  Discontinued  2 g 200 mL/hr over 30 Minutes Intravenous Every M-W-F (Hemodialysis) 12/26/23 1056 12/31/23 1221   12/29/23 1200  vancomycin  (VANCOREADY) IVPB 500 mg/100 mL  Status:  Discontinued        500 mg 100 mL/hr over 60 Minutes Intravenous Every M-W-F (Hemodialysis) 12/26/23 1056 12/31/23 1221   12/27/23 1400  ceFEPIme  (MAXIPIME ) 2 g in sodium chloride  0.9 % 100 mL IVPB        2 g 200 mL/hr over 30 Minutes Intravenous  Once 12/27/23 1319 12/27/23 1443   12/27/23 1200  vancomycin  (VANCOREADY) IVPB 500 mg/100 mL  Status:  Discontinued        500 mg 100 mL/hr over 60 Minutes Intravenous Every Sat (Hemodialysis) 12/25/23 1313 12/26/23 1056   12/27/23 1200  ceFEPIme  (MAXIPIME ) 2 g in sodium chloride  0.9 % 100 mL IVPB  Status:  Discontinued        2 g 200 mL/hr over 30 Minutes Intravenous Every Sat (Hemodialysis) 12/26/23  1056 12/27/23 1318   12/27/23 1200  vancomycin  (VANCOREADY) IVPB 500 mg/100 mL        500 mg 100 mL/hr over 60 Minutes Intravenous Every Sat (Hemodialysis) 12/26/23 1056 12/27/23 1227   12/26/23 1700  ciprofloxacin  (CIPRO ) tablet 500 mg        500 mg Oral  Once 12/26/23 1056 12/26/23 1704   12/25/23 1915  ciprofloxacin  (CIPRO ) tablet 500 mg        500 mg Oral  Once 12/25/23 1819 12/25/23 1849   12/25/23 1800  ceFEPIme  (MAXIPIME ) 1 g in sodium chloride  0.9 % 100 mL IVPB  Status:  Discontinued        1 g 200 mL/hr over 30 Minutes Intravenous Every 24 hours 12/25/23 1316 12/26/23 1056   12/25/23 1200  ceFEPIme  (MAXIPIME ) 2 g in sodium chloride  0.9 % 100 mL IVPB  Status:  Discontinued        2 g 200 mL/hr over 30 Minutes Intravenous Once in dialysis 12/24/23 1739 12/25/23 1316   12/25/23 1200  vancomycin  (VANCOCIN ) IVPB 1000 mg/200 mL premix  Status:  Discontinued        1,000 mg 200 mL/hr over 60 Minutes Intravenous  Once 12/24/23 1739 12/24/23 1809   12/25/23 1200  vancomycin  (VANCOREADY) IVPB 500 mg/100 mL  Status:  Discontinued        500 mg 100 mL/hr over 60 Minutes Intravenous  Once 12/24/23 1809 12/25/23 1313   12/24/23 1200  ceFEPIme  (MAXIPIME ) 2 g in sodium chloride  0.9 % 100 mL IVPB  Status:  Discontinued        2 g 200 mL/hr over 30 Minutes Intravenous Every M-W-F (Hemodialysis) 12/22/23 0851 12/25/23 1316   12/19/23 0000  ceFEPime  (MAXIPIME ) IVPB        2 g Intravenous Every M-W-F (Hemodialysis) 12/19/23 1300 02/09/24 2359   12/19/23 0000  vancomycin  IVPB        500 mg Intravenous Every M-W-F (Hemodialysis) 12/19/23 1300 02/09/24 2359   12/16/23 2200  ceFEPIme  (MAXIPIME ) 1 g in sodium chloride  0.9 % 100 mL IVPB        1 g 200 mL/hr over 30 Minutes Intravenous Every 24 hours 12/15/23 1348 12/22/23 2203   12/15/23 2230  vancomycin  (VANCOREADY) IVPB 500 mg/100 mL  Status:  Discontinued        500 mg 100 mL/hr over 60 Minutes Intravenous Every M-W-F (Hemodialysis) 12/15/23 2217  12/25/23 1313   12/15/23 1400  bictegravir-emtricitabine -tenofovir  AF (BIKTARVY ) 50-200-25 MG per tablet 1 tablet  Status:  Discontinued        1 tablet Oral Daily 12/15/23 1307 01/26/24 0853   12/15/23 1347  vancomycin  variable dose per unstable renal function (pharmacist dosing)  Status:  Discontinued         Does not apply See admin instructions 12/15/23 1348 12/15/23 2218   12/15/23 1100  ceFEPIme  (MAXIPIME ) 2 g in sodium chloride  0.9 % 100 mL IVPB        2 g 200 mL/hr over 30 Minutes Intravenous  Once 12/15/23 1048 12/15/23 1130   12/15/23 1100  vancomycin  (VANCOCIN ) IVPB 1000 mg/200 mL premix        1,000 mg 200 mL/hr over 60 Minutes Intravenous  Once 12/15/23 1048 12/15/23 1154       Subjective: Patient was seen and examined at HD suite.  Overnight events noted. Patient wants his pain to be well-controlled before he can call someone to bring his prosthesis.   Objective: Vitals:   02/07/24 1240 02/07/24 1651 02/07/24 1900 02/08/24 0744  BP: (!) 104/92 (!) 154/91  (!) 112/90  Pulse: 88 90 99 76  Resp: 16 18 17 14   Temp:  97.8 F (36.6 C) 98 F (36.7 C) 98 F (36.7 C)  TempSrc:  Oral  Oral  SpO2: 100% 100% 98% 90%  Weight:      Height:        Intake/Output Summary (Last 24 hours) at 02/08/2024 1042 Last data filed at 02/07/2024 1512 Gross per 24 hour  Intake 500 ml  Output 900 ml  Net -400 ml   Filed Weights   02/05/24 0832 02/07/24 0836 02/07/24 1217  Weight: 42.2 kg 42.9 kg 42 kg    Examination:  General exam: Appears calm and comfortable, deconditioned, not in any acute distress. Respiratory system: CTA Bilaterally. Respiratory effort normal.  RR 13 Cardiovascular system: S1 & S2 heard, RRR. No JVD, murmurs, rubs, gallops or clicks.  Gastrointestinal system: Abdomen is non distended, soft and non tender. Normal bowel sounds heard. Central nervous system: Alert and oriented x 3. No focal neurological deficits. Extremities: Bilateral BKA. Skin: No rashes,  lesions or ulcers Psychiatry: Judgement and insight appear normal. Mood & affect appropriate.   Data Reviewed: I have personally reviewed following labs and imaging studies  CBC: Recent Labs  Lab 02/05/24 0557 02/07/24 0607  WBC 5.5 5.0  HGB 10.5* 10.3*  HCT 33.0* 32.1*  MCV 99.1 99.4  PLT 162 158   Basic Metabolic Panel: Recent Labs  Lab 02/05/24 0557 02/07/24 0607  NA 131* 132*  K 4.0 4.0  CL 94* 94*  CO2 22 25  GLUCOSE 123* 105*  BUN 29* 24*  CREATININE 3.64* 3.05*  CALCIUM  9.3 9.4  MG  --  2.2  PHOS  --  3.0   GFR: Estimated Creatinine Clearance: 16.6 mL/min (A) (by C-G formula based on SCr of 3.05 mg/dL (H)). Liver Function Tests: No results for input(s): AST, ALT, ALKPHOS, BILITOT, PROT, ALBUMIN  in the last 168 hours.  No results for input(s): LIPASE, AMYLASE in the last 168 hours. No results for input(s): AMMONIA in the last 168 hours. Coagulation Profile: No results for input(s): INR, PROTIME in the last 168 hours. Cardiac Enzymes: No results for input(s): CKTOTAL, CKMB, CKMBINDEX, TROPONINI in the last 168 hours. BNP (last 3 results) No results for input(s): PROBNP in the last 8760 hours. HbA1C: No results for input(s): HGBA1C in the last 72 hours. CBG: Recent Labs  Lab 02/07/24 0506 02/07/24 1316 02/07/24 1644 02/07/24 2139 02/08/24 9378  GLUCAP 99 73 109* 101* 80   Lipid Profile: No results for input(s): CHOL, HDL, LDLCALC, TRIG, CHOLHDL, LDLDIRECT in the last 72 hours. Thyroid Function Tests: No results for input(s): TSH, T4TOTAL, FREET4, T3FREE, THYROIDAB in the last 72 hours. Anemia Panel: No results for input(s): VITAMINB12, FOLATE, FERRITIN, TIBC, IRON , RETICCTPCT in the last 72 hours. Sepsis Labs: No results for input(s): PROCALCITON, LATICACIDVEN in the last 168 hours.  No results found for this or any previous visit (from the past 240 hours).   Radiology  Studies: No results found.  Scheduled Meds:  (feeding supplement) PROSource Plus  30 mL Oral Daily   acetaminophen   1,000 mg Oral TID   artificial tears   Right Eye QID   bictegravir-emtricitabine -tenofovir  AF  1 tablet Oral Daily   Chlorhexidine  Gluconate Cloth  6 each Topical Q0600   darbepoetin (ARANESP ) injection - DIALYSIS  150 mcg Subcutaneous Q Fri-1800   feeding supplement (NEPRO CARB STEADY)  237 mL Oral BID AC & HS   fentaNYL   1 patch Transdermal Q72H   gabapentin   200 mg Oral TID   gatifloxacin   1 drop Right Eye BID   heparin   5,000 Units Subcutaneous Q8H   insulin  aspart  0-5 Units Subcutaneous QHS   insulin  aspart  0-6 Units Subcutaneous TID WC   lidocaine   2 patch Transdermal Q24H   methocarbamol   500 mg Oral TID   midodrine   10 mg Oral TID WC   midodrine   10 mg Oral Q T,Th,Sa-HD   multivitamin  1 tablet Oral QHS   pantoprazole   40 mg Oral BID AC   polyethylene glycol  17 g Oral Daily   sevelamer  carbonate  800 mg Oral TID WC   Continuous Infusions:  vancomycin  500 mg (02/07/24 1732)     LOS: 55 days    Time spent: 35 mins    Darcel Dawley, MD [facility]   If 7PM-7AM, please contact night-coverage

## 2024-02-08 NOTE — Progress Notes (Signed)
 Patient ID: Alvin Daniels, male   DOB: 06-22-1970, 53 y.o.   MRN: [id number] [facility] Progress Note   Assessment/ Plan:   1.  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  Duration of antibiotic treatment to end on 02/18/2024.  Unfortunately he is uninsured and unable to be placed at a skilled nursing facility for ongoing management. 2. ESRD: Previously on MWF dialysis schedule and currently TTS while in the hospital (reports that he desires the latter while here).  Underwent hemodialysis yesterday with next dialysis due 9/30. 3. Anemia: Low hemoglobin/hematocrit however appears to be improving with ongoing ESA.  Suspect significant role for malnutrition/inflammation complex with associated infection. 4. CKD-MBD: Calcium  and phosphorus levels currently at goal.  Calcitriol  on hold due to previous hypercalcemia. 5. Nutrition: Continue renal diet/fluid restriction and ongoing protein supplementation. 6. Hypotension: Chronic, monitor with ongoing midodrine  and efforts at volume management.  Subjective:   Some problems with low blood pressures during dialysis yesterday limiting ultrafiltration.  Back pain and left thigh pain   Objective:   BP (!) 112/90 (BP Location: Right Arm)   Pulse 76   Temp 98 F (36.7 C) (Oral)   Resp 14   Ht 4' 11 (1.499 m)   Wt 42 kg   SpO2 90%   BMI 18.70 kg/m   Physical Exam: Gen: Chronically ill-appearing man resting comfortably in bed, listening to music CVS: Pulse regular rhythm, normal rate, S1 and S2 normal Resp: Clear to auscultation bilaterally, no rales/rhonchi.  Right IJ TDC in place Abd: Soft, flat, nontender, bowel sounds normal Ext: Status post bilateral below-knee amputations.    Labs: BMET Recent Labs  Lab 02/05/24 0557 02/07/24 0607  NA 131* 132*  K 4.0 4.0  CL 94* 94*  CO2 22 25  GLUCOSE 123* 105*  BUN 29* 24*  CREATININE  3.64* 3.05*  CALCIUM  9.3 9.4  PHOS  --  3.0   CBC Recent Labs  Lab 02/05/24 0557 02/07/24 0607  WBC 5.5 5.0  HGB 10.5* 10.3*  HCT 33.0* 32.1*  MCV 99.1 99.4  PLT 162 158    Medications:     (feeding supplement) PROSource Plus  30 mL Oral Daily   acetaminophen   1,000 mg Oral TID   artificial tears   Right Eye QID   bictegravir-emtricitabine -tenofovir  AF  1 tablet Oral Daily   Chlorhexidine  Gluconate Cloth  6 each Topical Q0600   darbepoetin (ARANESP ) injection - DIALYSIS  150 mcg Subcutaneous Q Fri-1800   feeding supplement (NEPRO CARB STEADY)  237 mL Oral BID AC & HS   fentaNYL   1 patch Transdermal Q72H   gabapentin   200 mg Oral TID   gatifloxacin   1 drop Right Eye BID   heparin   5,000 Units Subcutaneous Q8H   insulin  aspart  0-5 Units Subcutaneous QHS   insulin  aspart  0-6 Units Subcutaneous TID WC   lidocaine   2 patch Transdermal Q24H   methocarbamol   500 mg Oral TID   midodrine   10 mg Oral TID WC   midodrine   10 mg Oral Q T,Th,Sa-HD   multivitamin  1 tablet Oral QHS   pantoprazole   40 mg Oral BID AC   polyethylene glycol  17 g Oral Daily   sevelamer  carbonate  800 mg Oral TID WC   Gordy Blanch, MD 02/08/2024, 9:46 AM

## 2024-02-08 NOTE — Plan of Care (Signed)
  Problem: Pain Managment: Goal: General experience of comfort will improve and/or be controlled Outcome: Progressing   Problem: Safety: Goal: Ability to remain free from injury will improve Outcome: Progressing

## 2024-02-08 NOTE — Plan of Care (Signed)

## 2024-02-09 LAB — GLUCOSE, CAPILLARY
Glucose-Capillary: 73 mg/dL (ref 70–99)
Glucose-Capillary: 70 mg/dL (ref 70–99)
Glucose-Capillary: 86 mg/dL (ref 70–99)
Glucose-Capillary: 79 mg/dL (ref 70–99)

## 2024-02-09 NOTE — Progress Notes (Signed)
 PROGRESS NOTE    Alvin Daniels  FMW:[id number] DOB: [birth year] DOA: 12/14/2023 PCP: Pcp, No   Brief Narrative:  This 53 yrs old male with PMH significant for PAD s/p bilateral BKA, ESRD on HD, HIV, chronic HCV-recent hospitalization from 5/28-6/6 for MRSA bacteremia with left fourth finger osteomyelitis (s/p amputation 5/29)-presented to the hospital with right hip pain. Patient was found to have L4-L5 osteomyelitis with psoas abscess.  Patient was admitted for further evaluation and orthopedics is consulted.   Significant studies: 6/4>> TEE: EF 60-65%, 1.8 x 1.2 mass right atrial wall- vegetation from old HD catheter impinged on RA wall-versus mass/tumor. 8/4>> MRI pelvis: Discitis/osteomyelitis L4-5-right psoas abscess. 8/5>> CT abdomen/pelvis: Discitis/osteomyelitis L4-5 with paraspinal inflammatory changes/right psoas abscess.  8/8>> MRI LS spine: L4-5 discitis/osteomyelitis with associated paraspinal inflammatory changes, right psoas abscess. 8/20>> MRI left hip: Discitis of the lower lumbar spine with extension into the psoas muscle. 8/20>> MRI left knee: BKA-no significant abnormality. 8/25>> MRI LS spine: Persistent/ongoing changes of osteomyelitis at L4/L5 with associated epidural phlegmon/abscess within the ventral epidural space at L4/L5.  Associated multiloculated bilateral psoas abscess measuring up to 3.1 cm on the left.   Significant microbiology data: 5/28>> blood culture: MRSA 5/29>> left finger abscess: E. coli/Streptococcus anginosus, Bacteroides fragilis. 8/4>> blood culture: No growth 8/27 >> L4-L5 disc aspirate culture: MRSA.     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 coli on bone culture) As per prior notes,  Patient is not a surgical candidate. IR was able to aspirate from L4-L5 >positive for MRSA on 01/07/24. As per last ID note from Dr. Overton, recommended 6 weeks of IV vancomycin  from 8/27 -EOT around 02/18/24. Will need reimaging at some point-closer to antibiotic EOT ~10/1.   Chronic hypotension: Continue midodrine .   ESRD on HD MWF: Nephrology following.  Next hemodialysis today  Normocytic anemia: Secondary to ESRD Hb stable. Continue Aranesp /iron  defer to nephrology service.   PAD-s/p bilateral BKA: Normally uses prosthesis. Telling some staff that he 'hasn't walked in years' . Renal navigator seeking collateral information from dialysis center. Due to severe pain-He has been refusing to mobilize with PT  Note-has been refusing physical therapy-multiple times-they have signed off.   HIV (CD4 254 on 5/29) Continue HAART.   Chronic HCV: Outpatient follow-up with infectious disease.   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 Continue moxifloxacin eyedrops twice daily OD, ophthalmic lubricant 4 times daily OD.   Chronic intrahepatic /extrahepatic biliary dilatation: Incidental finding Per radiology-similar to prior CTs.   Difficult disposition: Lives alone, has no friends, unclear if he has family-uninsured-SNF not a option per SW-history of ESRD with bilateral BKA-currently claims unable to ambulate with his prostheses, can't bring prostheses-hence unable to be discharged home safely.   Patient has been refusing PT/OT-multiple times-has  resolved-PT/OT have signed off. Normally-prior to this hospitalization was able to ambulate with  prosthesis and catch the bus/scat transport to go back and forth from dialysis center.    DM-2 (A1c 10.0 on 3/6) CBG stable. Continue SSI    DVT prophylaxis: Heparin  sq Code Status: Full code Family Communication: No family at bed side. Disposition Plan:    Status is: Inpatient Remains inpatient appropriate because: Severity of illness. Patient is homeless, No Family, Bilateral BKA.   Consultants:  ID/nephrology/ophthalmology/interventional radiology.    Procedures:   8/27 >> IR aspiration 8/27 of of L4-L5 disc aspirate  Antimicrobials:  Anti-infectives (From admission, onward)    Start     Dose/Rate Route Frequency Ordered Stop   01/26/24 1800  bictegravir-emtricitabine -tenofovir  AF (BIKTARVY ) 50-200-25 MG per tablet 1 tablet        1 tablet Oral Daily 01/26/24 0853     01/06/24 1800  ceFEPIme  (MAXIPIME ) 2 g in sodium chloride  0.9 % 100 mL IVPB  Status:  Discontinued        2 g 200 mL/hr over 30 Minutes Intravenous Every T-Th-Sa (1800) 01/05/24 1131 01/08/24 1053   01/06/24 1200  vancomycin  (VANCOREADY) IVPB 500 mg/100 mL        500 mg 100 mL/hr over 60 Minutes Intravenous Every T-Th-Sa (Hemodialysis) 01/05/24 1130     01/03/24 1200  vancomycin  (VANCOREADY) IVPB 500 mg/100 mL        500 mg 100 mL/hr over 60 Minutes Intravenous  Once 01/02/24 1052 01/03/24 1414   01/03/24 1200  ceFEPIme  (MAXIPIME ) 2 g in sodium chloride  0.9 % 100 mL IVPB        2 g 200 mL/hr over 30 Minutes Intravenous  Once 01/02/24 1212 01/03/24 1343   01/02/24 1200  ceFEPIme  (MAXIPIME ) 2 g in sodium chloride  0.9 % 100 mL IVPB  Status:  Discontinued        2 g 200 mL/hr over 30 Minutes Intravenous Every M-W-F (Hemodialysis) 12/31/23 1221 01/05/24 1131   01/02/24 1200  vancomycin  (VANCOREADY) IVPB 500 mg/100 mL  Status:  Discontinued        500 mg 100 mL/hr over 60 Minutes Intravenous Every M-W-F  (Hemodialysis) 12/31/23 1221 01/05/24 1130   01/01/24 1200  vancomycin  (VANCOREADY) IVPB 500 mg/100 mL        500 mg 100 mL/hr over 60 Minutes Intravenous Every T-Th-Sa (Hemodialysis) 12/31/23 1219 01/01/24 1609   01/01/24 1200  ceFEPIme  (MAXIPIME ) 2 g in sodium chloride  0.9 % 100 mL IVPB        2 g 200 mL/hr over 30 Minutes Intravenous Once in dialysis 12/31/23 1219 01/01/24 1840   12/30/23 1400  vancomycin  (VANCOREADY) IVPB 500 mg/100 mL        500 mg 100 mL/hr over 60 Minutes Intravenous  Once 12/30/23 1310 12/30/23 2033   12/30/23 1400  ceFEPIme  (MAXIPIME ) 1 g in sodium chloride  0.9 % 100 mL IVPB        1 g 200 mL/hr over 30 Minutes Intravenous  Once 12/30/23 1310 12/30/23 2005   12/29/23 2230  ceFEPIme  (MAXIPIME ) 1 g in sodium chloride  0.9 % 100 mL IVPB        1 g 200 mL/hr over 30 Minutes Intravenous  Once 12/29/23 2131 12/30/23 0656   12/29/23 1200  vancomycin  (VANCOREADY) IVPB 500 mg/100 mL  Status:  Discontinued        500 mg 100 mL/hr over 60 Minutes Intravenous Every M-W-F (Hemodialysis) 12/25/23 1313 12/26/23 1056   12/29/23 1200  ceFEPIme  (MAXIPIME ) 2 g in sodium chloride  0.9 % 100 mL IVPB  Status:  Discontinued        2 g 200 mL/hr over 30 Minutes Intravenous Every M-W-F (Hemodialysis) 12/26/23 1056 12/31/23 1221   12/29/23 1200  vancomycin  (VANCOREADY) IVPB 500 mg/100 mL  Status:  Discontinued        500 mg 100 mL/hr over 60 Minutes Intravenous Every M-W-F (Hemodialysis) 12/26/23 1056 12/31/23 1221   12/27/23 1400  ceFEPIme  (MAXIPIME ) 2 g in sodium chloride  0.9 % 100 mL IVPB        2 g 200 mL/hr over 30 Minutes Intravenous  Once 12/27/23 1319 12/27/23 1443   12/27/23 1200  vancomycin  (VANCOREADY) IVPB 500 mg/100 mL  Status:  Discontinued        500 mg 100 mL/hr over 60 Minutes Intravenous Every Sat (Hemodialysis) 12/25/23 1313 12/26/23 1056   12/27/23 1200  ceFEPIme  (MAXIPIME ) 2 g in sodium chloride  0.9 % 100 mL IVPB  Status:  Discontinued        2 g 200 mL/hr over 30  Minutes Intravenous Every Sat (Hemodialysis) 12/26/23 1056 12/27/23 1318   12/27/23 1200  vancomycin  (VANCOREADY) IVPB 500 mg/100 mL        500 mg 100 mL/hr over 60 Minutes Intravenous Every Sat (Hemodialysis) 12/26/23 1056 12/27/23 1227   12/26/23 1700  ciprofloxacin  (CIPRO ) tablet 500 mg        500 mg Oral  Once 12/26/23 1056 12/26/23 1704   12/25/23 1915  ciprofloxacin  (CIPRO ) tablet 500 mg        500 mg Oral  Once 12/25/23 1819 12/25/23 1849   12/25/23 1800  ceFEPIme  (MAXIPIME ) 1 g in sodium chloride  0.9 % 100 mL IVPB  Status:  Discontinued        1 g 200 mL/hr over 30 Minutes Intravenous Every 24 hours 12/25/23 1316 12/26/23 1056   12/25/23 1200  ceFEPIme  (MAXIPIME ) 2 g in sodium chloride  0.9 % 100 mL IVPB  Status:  Discontinued        2 g 200 mL/hr over 30 Minutes Intravenous Once in dialysis 12/24/23 1739 12/25/23 1316   12/25/23 1200  vancomycin  (VANCOCIN ) IVPB 1000 mg/200 mL premix  Status:  Discontinued        1,000 mg 200 mL/hr over 60 Minutes Intravenous  Once 12/24/23 1739 12/24/23 1809   12/25/23 1200  vancomycin  (VANCOREADY) IVPB 500 mg/100 mL  Status:  Discontinued        500 mg 100 mL/hr over 60 Minutes Intravenous  Once 12/24/23 1809 12/25/23 1313   12/24/23 1200  ceFEPIme  (MAXIPIME ) 2 g in sodium chloride  0.9 % 100 mL IVPB  Status:  Discontinued        2 g 200 mL/hr over 30 Minutes Intravenous Every M-W-F (Hemodialysis) 12/22/23 0851 12/25/23 1316   12/19/23 0000  ceFEPime  (MAXIPIME ) IVPB        2 g Intravenous Every M-W-F (Hemodialysis) 12/19/23 1300 02/09/24 2359   12/19/23 0000  vancomycin  IVPB        500 mg Intravenous Every M-W-F (Hemodialysis) 12/19/23 1300 02/09/24 2359   12/16/23 2200  ceFEPIme  (MAXIPIME ) 1 g in sodium chloride  0.9 % 100 mL IVPB        1 g 200 mL/hr over 30 Minutes Intravenous Every 24 hours 12/15/23 1348 12/22/23 2203   12/15/23 2230  vancomycin  (VANCOREADY) IVPB 500 mg/100 mL  Status:  Discontinued        500 mg 100 mL/hr over 60 Minutes  Intravenous Every M-W-F (Hemodialysis) 12/15/23 2217 12/25/23 1313   12/15/23 1400  bictegravir-emtricitabine -tenofovir  AF (BIKTARVY )  50-200-25 MG per tablet 1 tablet  Status:  Discontinued        1 tablet Oral Daily 12/15/23 1307 01/26/24 0853   12/15/23 1347  vancomycin  variable dose per unstable renal function (pharmacist dosing)  Status:  Discontinued         Does not apply See admin instructions 12/15/23 1348 12/15/23 2218   12/15/23 1100  ceFEPIme  (MAXIPIME ) 2 g in sodium chloride  0.9 % 100 mL IVPB        2 g 200 mL/hr over 30 Minutes Intravenous  Once 12/15/23 1048 12/15/23 1130   12/15/23 1100  vancomycin  (VANCOCIN ) IVPB 1000 mg/200 mL premix        1,000 mg 200 mL/hr over 60 Minutes Intravenous  Once 12/15/23 1048 12/15/23 1154       Subjective: Patient was seen and examined at bed side. Overnight events noted. Patient reports pain is not reasonably controlled and states she does have family member will be coming tomorrow.   Objective: Vitals:   02/08/24 1619 02/08/24 2122 02/09/24 0448 02/09/24 1041  BP: (!) 127/90 (!) 139/96 (!) 132/92 112/76  Pulse: 80 77 73 74  Resp:  16 16 16   Temp: (!) 97.5 F (36.4 C) 97.6 F (36.4 C) 98.1 F (36.7 C)   TempSrc: Oral Oral Oral   SpO2: 100% 96% 96%   Weight:      Height:        Intake/Output Summary (Last 24 hours) at 02/09/2024 1106 Last data filed at 02/09/2024 0900 Gross per 24 hour  Intake 120 ml  Output --  Net 120 ml   Filed Weights   02/05/24 0832 02/07/24 0836 02/07/24 1217  Weight: 42.2 kg 42.9 kg 42 kg    Examination:  General exam: Appears calm and comfortable, deconditioned, not in any acute distress. Respiratory system: CTA Bilaterally. Respiratory effort normal.  RR 14 Cardiovascular system: S1 & S2 heard, RRR. No JVD, murmurs, rubs, gallops or clicks.  Gastrointestinal system: Abdomen is non distended, soft and non tender. Normal bowel sounds heard. Central nervous system: Alert and oriented x 3. No  focal neurological deficits. Extremities: Bilateral BKA. Skin: No rashes, lesions or ulcers Psychiatry: Judgement and insight appear normal. Mood & affect appropriate.   Data Reviewed: I have personally reviewed following labs and imaging studies  CBC: Recent Labs  Lab 02/05/24 0557 02/07/24 0607  WBC 5.5 5.0  HGB 10.5* 10.3*  HCT 33.0* 32.1*  MCV 99.1 99.4  PLT 162 158   Basic Metabolic Panel: Recent Labs  Lab 02/05/24 0557 02/07/24 0607  NA 131* 132*  K 4.0 4.0  CL 94* 94*  CO2 22 25  GLUCOSE 123* 105*  BUN 29* 24*  CREATININE 3.64* 3.05*  CALCIUM  9.3 9.4  MG  --  2.2  PHOS  --  3.0   GFR: Estimated Creatinine Clearance: 16.6 mL/min (A) (by C-G formula based on SCr of 3.05 mg/dL (H)). Liver Function Tests: No results for input(s): AST, ALT, ALKPHOS, BILITOT, PROT, ALBUMIN  in the last 168 hours.  No results for input(s): LIPASE, AMYLASE in the last 168 hours. No results for input(s): AMMONIA in the last 168 hours. Coagulation Profile: No results for input(s): INR, PROTIME in the last 168 hours. Cardiac Enzymes: No results for input(s): CKTOTAL, CKMB, CKMBINDEX, TROPONINI in the last 168 hours. BNP (last 3 results) No results for input(s): PROBNP in the last 8760 hours. HbA1C: No results for input(s): HGBA1C in the last 72 hours. CBG: Recent Labs  Lab  02/08/24 0621 02/08/24 1134 02/08/24 1616 02/08/24 2120 02/09/24 0621  GLUCAP 80 93 99 87 73   Lipid Profile: No results for input(s): CHOL, HDL, LDLCALC, TRIG, CHOLHDL, LDLDIRECT in the last 72 hours. Thyroid Function Tests: No results for input(s): TSH, T4TOTAL, FREET4, T3FREE, THYROIDAB in the last 72 hours. Anemia Panel: No results for input(s): VITAMINB12, FOLATE, FERRITIN, TIBC, IRON , RETICCTPCT in the last 72 hours. Sepsis Labs: No results for input(s): PROCALCITON, LATICACIDVEN in the last 168 hours.  No results found for  this or any previous visit (from the past 240 hours).   Radiology Studies: No results found.  Scheduled Meds:  (feeding supplement) PROSource Plus  30 mL Oral Daily   acetaminophen   1,000 mg Oral TID   artificial tears   Right Eye QID   bictegravir-emtricitabine -tenofovir  AF  1 tablet Oral Daily   Chlorhexidine  Gluconate Cloth  6 each Topical Q0600   darbepoetin (ARANESP ) injection - DIALYSIS  150 mcg Subcutaneous Q Fri-1800   feeding supplement (NEPRO CARB STEADY)  237 mL Oral BID AC & HS   fentaNYL   1 patch Transdermal Q72H   gabapentin   200 mg Oral TID   gatifloxacin   1 drop Right Eye BID   heparin   5,000 Units Subcutaneous Q8H   insulin  aspart  0-5 Units Subcutaneous QHS   insulin  aspart  0-6 Units Subcutaneous TID WC   lidocaine   2 patch Transdermal Q24H   methocarbamol   500 mg Oral TID   midodrine   10 mg Oral TID WC   midodrine   10 mg Oral Q T,Th,Sa-HD   multivitamin  1 tablet Oral QHS   pantoprazole   40 mg Oral BID AC   polyethylene glycol  17 g Oral Daily   sevelamer  carbonate  800 mg Oral TID WC   Continuous Infusions:  vancomycin  500 mg (02/07/24 1732)     LOS: 56 days    Time spent: 35 mins    Darcel Dawley, MD [facility]   If 7PM-7AM, please contact night-coverage

## 2024-02-09 NOTE — Progress Notes (Signed)
  [facility] Progress Note   Subjective:   Seen in room. C/o ongoing hip pain. Bedside translator not working - tried to have conversation via Microbiologist translate - essentially wants to know why his pain is not better and does he need a stronger abx. Explained that has still been getting IV abx and pain may be due to permanent spinal injuries related to his infection - unclear if pain will improve? He did not seem to understand what I was saying - will pass the message to his hosptialist.  Objective Vitals:   02/08/24 1619 02/08/24 2122 02/09/24 0448 02/09/24 1041  BP: (!) 127/90 (!) 139/96 (!) 132/92 112/76  Pulse: 80 77 73 74  Resp:  16 16 16   Temp: (!) 97.5 F (36.4 C) 97.6 F (36.4 C) 98.1 F (36.7 C)   TempSrc: Oral Oral Oral   SpO2: 100% 96% 96%   Weight:      Height:       Physical Exam General: Chronically ill, frail man, NAD Heart: RRR Lungs: CTAB Abdomen: soft Extremities: no LE edema, B BKA Dialysis Access: [facility]  Additional Objective Labs: Basic Metabolic Panel: Recent Labs  Lab 02/05/24 0557 02/07/24 0607  NA 131* 132*  K 4.0 4.0  CL 94* 94*  CO2 22 25  GLUCOSE 123* 105*  BUN 29* 24*  CREATININE 3.64* 3.05*  CALCIUM  9.3 9.4  PHOS  --  3.0   CBC: Recent Labs  Lab 02/05/24 0557 02/07/24 0607  WBC 5.5 5.0  HGB 10.5* 10.3*  HCT 33.0* 32.1*  MCV 99.1 99.4  PLT 162 158   Medications:  vancomycin  500 mg (02/07/24 1732)    (feeding supplement) PROSource Plus  30 mL Oral Daily   acetaminophen   1,000 mg Oral TID   artificial tears   Right Eye QID   bictegravir-emtricitabine -tenofovir  AF  1 tablet Oral Daily   Chlorhexidine  Gluconate Cloth  6 each Topical Q0600   darbepoetin (ARANESP ) injection - DIALYSIS  150 mcg Subcutaneous Q Fri-1800   feeding supplement (NEPRO CARB STEADY)  237 mL Oral BID AC & HS   fentaNYL   1 patch Transdermal Q72H   gabapentin   200 mg Oral TID   gatifloxacin   1 drop Right Eye BID   heparin   5,000 Units  Subcutaneous Q8H   insulin  aspart  0-5 Units Subcutaneous QHS   insulin  aspart  0-6 Units Subcutaneous TID WC   lidocaine   2 patch Transdermal Q24H   methocarbamol   500 mg Oral TID   midodrine   10 mg Oral TID WC   midodrine   10 mg Oral Q T,Th,Sa-HD   multivitamin  1 tablet Oral QHS   pantoprazole   40 mg Oral BID AC   polyethylene glycol  17 g Oral Daily   sevelamer  carbonate  800 mg Oral TID WC    Dialysis Orders [facility] - MWF 3.5h  B400  45.5kg  TDC  Heparin  2000 Mircera 100 mcg - last dose 12/01/23 Calcitriol  1.0 mcg (on hold due to high Ca) Uses midodrine  10mg  TID + extra pre-HD    Assessment/Plan: L4-5 discitis/osteomyelitis/psoas abscess: Vanc/Cefepime  through 8/28, now Vanc 500mg  q HD alone until 02/18/24 per ID. S/p IR aspiration 8/27 - growing MRSA. Not a surgical candidate. Pain management per primary, unclear if pain will improve. Plan is repeat imaging in the near future. ESRD: Historically MWF schedule - on TTS this admit, insistent to stay on that schedule - next HD tomorrow. BP/volume: Chronic hypotension on mido 10mg  TID and extra  pre-HD. Below EDW and will need to be lowered at dc.  Anemia of ESRD: Hgb 10.3 - continue Aranesp  150mcg q Friday. No IV iron  d/t infection. HIV: controlled on Biktarvy  BMD: CorrCa high, VDRA on hold. Phos on goal, on sevelamer  as binder.  Nutrition: Alb low, continue supps and diet has been liberalized. Debility: PT/OT as tolerated. Dispo: Very difficult placement. Noted patient is uninsured thus SNF is not an option.  No family or safe dispo plan at this time.   Alvin Boehringer, PA-C 02/09/2024, 12:15 PM  [employer]

## 2024-02-10 LAB — GLUCOSE, CAPILLARY
Glucose-Capillary: 76 mg/dL (ref 70–99)
Glucose-Capillary: 68 mg/dL — ABNORMAL LOW (ref 70–99)
Glucose-Capillary: 78 mg/dL (ref 70–99)
Glucose-Capillary: 56 mg/dL — ABNORMAL LOW (ref 70–99)
Glucose-Capillary: 77 mg/dL (ref 70–99)

## 2024-02-10 LAB — RENAL FUNCTION PANEL
Albumin: 2.5 g/dL — ABNORMAL LOW (ref 3.5–5.0)
Anion gap: 14 (ref 5–15)
BUN: 45 mg/dL — ABNORMAL HIGH (ref 6–20)
CO2: 23 mmol/L (ref 22–32)
Calcium: 9.6 mg/dL (ref 8.9–10.3)
Chloride: 93 mmol/L — ABNORMAL LOW (ref 98–111)
Creatinine, Ser: 4 mg/dL — ABNORMAL HIGH (ref 0.61–1.24)
GFR, Estimated: 17 mL/min — ABNORMAL LOW (ref >60)
Glucose, Bld: 91 mg/dL (ref 70–99)
Phosphorus: 4.1 mg/dL (ref 2.5–4.6)
Potassium: 4.5 mmol/L (ref 3.5–5.1)
Sodium: 130 mmol/L — ABNORMAL LOW (ref 135–145)

## 2024-02-10 LAB — CBC
HCT: 31.8 % — ABNORMAL LOW (ref 39.0–52.0)
Hemoglobin: 10.2 g/dL — ABNORMAL LOW (ref 13.0–17.0)
MCH: 32.2 pg (ref 26.0–34.0)
MCHC: 32.1 g/dL (ref 30.0–36.0)
MCV: 100.3 fL — ABNORMAL HIGH (ref 80.0–100.0)
Platelets: 133 K/uL — ABNORMAL LOW (ref 150–400)
RBC: 3.17 MIL/uL — ABNORMAL LOW (ref 4.22–5.81)
RDW: 19.9 % — ABNORMAL HIGH (ref 11.5–15.5)
WBC: 5.2 K/uL (ref 4.0–10.5)
nRBC: 0 % (ref 0.0–0.2)

## 2024-02-10 LAB — VANCOMYCIN, RANDOM: Vancomycin Rm: 28 ug/mL

## 2024-02-10 MED ORDER — DEXTROSE 50 % IV SOLN
50.0000 mL | INTRAVENOUS | Status: DC | PRN
  Administered 2024-02-12 – 2024-02-15 (×3): 50 mL via INTRAVENOUS
  Filled 2024-02-10 (×2): qty 50

## 2024-02-10 MED ORDER — HEPARIN SODIUM (PORCINE) 1000 UNIT/ML IJ SOLN
INTRAMUSCULAR | Status: AC
  Filled 2024-02-10: qty 4

## 2024-02-10 MED ORDER — VANCOMYCIN HCL 500 MG/100ML IV SOLN
500.0000 mg | INTRAVENOUS | Status: AC
Start: 2024-02-12 — End: 2024-02-17
  Administered 2024-02-12 – 2024-02-17 (×3): 500 mg via INTRAVENOUS
  Filled 2024-02-10 (×4): qty 100

## 2024-02-10 NOTE — Procedures (Signed)
 HD Note:  Some information was entered later than the data was gathered due to patient care needs. The stated time with the data is accurate.  Received patient in bed to unit.   Alert and oriented. Staff member translated comfort need for the patient.  Informed consent signed and in chart.   Access used: Upper right chest HD catheter Access issues: No issues  Patient BP decreased to below SBP 90.  UF paused. Patient stated he was dizzy, 100 ml NS given.  Patient received 400 ml over the course of treatment, including the 100 ml bolus.  His BP would not tolerate any further UF, even the removal of the flushes that were used.  Patient was confused as to why his catheter dressing is not being changed with each treatment like it is done at the outpatient center.  Clarification was made in Spanish, he did say he just disagreed.  No further comments made.  TX duration: 3 hours  Alert, without acute distress.  Hand-off given to patient's nurse.   Transported back to the room   Warnie Belair L. Lenon, RN [facility].

## 2024-02-10 NOTE — Plan of Care (Signed)
  Problem: Coping: Goal: Level of anxiety will decrease Outcome: Progressing   Problem: Pain Managment: Goal: General experience of comfort will improve and/or be controlled Outcome: Progressing   Problem: Coping: Goal: Ability to adjust to condition or change in health will improve Outcome: Progressing   Problem: Metabolic: Goal: Ability to maintain appropriate glucose levels will improve Outcome: Progressing

## 2024-02-10 NOTE — Progress Notes (Signed)
 Hypoglycemic Event  CBG: 68  Treatment: 4 oz juice/soda  Symptoms: None  Follow-up CBG: Time:1154 CBG Result:78  Possible Reasons for Event: Other: patient did not want to eat prior to dialysis, stating that it makes him nauseas.  Comments/MD notified:No new orders.  Continue to monitor    Artist CHRISTELLA Barge

## 2024-02-10 NOTE — Progress Notes (Signed)
 PROGRESS NOTE    Alvin Daniels  FMW:[id number] DOB: 1970-10-09 DOA: 12/14/2023 PCP: Pcp, No   Brief Narrative:  This 53 yrs old male with PMH significant for PAD s/p bilateral BKA, ESRD on HD, HIV, chronic HCV-recent hospitalization from 5/28-6/6 for MRSA bacteremia with left fourth finger osteomyelitis (s/p amputation 5/29)-presented to the hospital with right hip pain. Patient was found to have L4-L5 osteomyelitis with psoas abscess.  Patient was admitted for further evaluation and orthopedics is consulted.   Significant studies: 6/4>> TEE: EF 60-65%, 1.8 x 1.2 mass right atrial wall- vegetation from old HD catheter impinged on RA wall-versus mass/tumor. 8/4>> MRI pelvis: Discitis/osteomyelitis L4-5-right psoas abscess. 8/5>> CT abdomen/pelvis: Discitis/osteomyelitis L4-5 with paraspinal inflammatory changes/right psoas abscess.  8/8>> MRI LS spine: L4-5 discitis/osteomyelitis with associated paraspinal inflammatory changes, right psoas abscess. 8/20>> MRI left hip: Discitis of the lower lumbar spine with extension into the psoas muscle. 8/20>> MRI left knee: BKA-no significant abnormality. 8/25>> MRI LS spine: Persistent/ongoing changes of osteomyelitis at L4/L5 with associated epidural phlegmon/abscess within the ventral epidural space at L4/L5.  Associated multiloculated bilateral psoas abscess measuring up to 3.1 cm on the left.   Significant microbiology data: 5/28>> blood culture: MRSA 5/29>> left finger abscess: E. coli/Streptococcus anginosus, Bacteroides fragilis. 8/4>> blood culture: No growth 8/27 >> L4-L5 disc aspirate culture: MRSA.     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 coli on bone culture) As per prior notes,  Patient is not a surgical candidate. IR was able to aspirate from L4-L5 >positive for MRSA on 01/07/24. As per last ID note from Dr. Overton, recommended 6 weeks of IV vancomycin  from 8/27 -EOT around 02/18/24. Will need reimaging at some point-closer to antibiotic EOT ~10/1.   Chronic hypotension: Continue midodrine .   ESRD on HD MWF: Nephrology following.  Next hemodialysis today  Normocytic anemia: Secondary to ESRD Hb stable. Continue Aranesp /iron  defer to nephrology service.   PAD-s/p bilateral BKA: Normally uses prosthesis. Telling some staff that he 'hasn't walked in years' . Renal navigator seeking collateral information from dialysis center. Due to severe pain-He has been refusing to mobilize with PT  Note-has been refusing physical therapy-multiple times-they have signed off.   HIV (CD4 254 on 5/29) Continue HAART.   Chronic HCV: Outpatient follow-up with infectious disease.   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 Continue moxifloxacin eyedrops twice daily OD, ophthalmic lubricant 4 times daily OD.   Chronic intrahepatic /extrahepatic biliary dilatation: Incidental finding Per radiology-similar to prior CTs.   Difficult disposition: Lives alone, has no friends, unclear if he has family - uninsured-SNF not a option per SW -history of ESRD with bilateral BKA -currently claims unable to ambulate with his prostheses, can't bring prostheses -hence unable to be discharged home safely.   Patient has been refusing PT/OT-multiple  times-has resolved-PT/OT have signed off. Normally-prior to this hospitalization  was able to ambulate with prosthesis and catch the bus/scat transport to go back and forth from dialysis center.    DM-2 (A1c 10.0 on 3/6) CBG stable. Continue SSI    DVT prophylaxis: Heparin  sq Code Status: Full code Family Communication: No family at bed side. Disposition Plan:    Status is: Inpatient Remains inpatient appropriate because: Severity of illness. Patient is homeless, No Family, Bilateral BKA.   Consultants:  ID/nephrology/ophthalmology/interventional radiology.    Procedures:   8/27 >> IR aspiration 8/27 of of L4-L5 disc aspirate  Antimicrobials:  Anti-infectives (From admission, onward)    Start     Dose/Rate Route Frequency Ordered Stop   02/12/24 1800  vancomycin  (VANCOREADY) IVPB 500 mg/100 mL        500 mg 100 mL/hr over 60 Minutes Intravenous Every T-Th-Sa (Hemodialysis) 02/10/24 0958     01/26/24 1800  bictegravir-emtricitabine -tenofovir  AF (BIKTARVY ) 50-200-25 MG per tablet 1 tablet        1 tablet Oral Daily 01/26/24 0853     01/06/24 1800  ceFEPIme  (MAXIPIME ) 2 g in sodium chloride  0.9 % 100 mL IVPB  Status:  Discontinued        2 g 200 mL/hr over 30 Minutes Intravenous Every T-Th-Sa (1800) 01/05/24 1131 01/08/24 1053   01/06/24 1200  vancomycin  (VANCOREADY) IVPB 500 mg/100 mL  Status:  Discontinued        500 mg 100 mL/hr over 60 Minutes Intravenous Every T-Th-Sa (Hemodialysis) 01/05/24 1130 02/10/24 0958   01/03/24 1200  vancomycin  (VANCOREADY) IVPB 500 mg/100 mL        500 mg 100 mL/hr over 60 Minutes Intravenous  Once 01/02/24 1052 01/03/24 1414   01/03/24 1200  ceFEPIme  (MAXIPIME ) 2 g in sodium chloride  0.9 % 100 mL IVPB        2 g 200 mL/hr over 30 Minutes Intravenous  Once 01/02/24 1212 01/03/24 1343   01/02/24 1200  ceFEPIme  (MAXIPIME ) 2 g in sodium chloride  0.9 % 100 mL IVPB  Status:  Discontinued        2 g 200 mL/hr over 30 Minutes Intravenous Every  M-W-F (Hemodialysis) 12/31/23 1221 01/05/24 1131   01/02/24 1200  vancomycin  (VANCOREADY) IVPB 500 mg/100 mL  Status:  Discontinued        500 mg 100 mL/hr over 60 Minutes Intravenous Every M-W-F (Hemodialysis) 12/31/23 1221 01/05/24 1130   01/01/24 1200  vancomycin  (VANCOREADY) IVPB 500 mg/100 mL        500 mg 100 mL/hr over 60 Minutes Intravenous Every T-Th-Sa (Hemodialysis) 12/31/23 1219 01/01/24 1609   01/01/24 1200  ceFEPIme  (MAXIPIME ) 2 g in sodium chloride  0.9 % 100 mL IVPB        2 g 200 mL/hr over 30 Minutes Intravenous Once in dialysis 12/31/23 1219 01/01/24 1840   12/30/23 1400  vancomycin  (VANCOREADY) IVPB 500 mg/100 mL        500 mg 100 mL/hr over 60 Minutes Intravenous  Once 12/30/23 1310 12/30/23 2033   12/30/23 1400  ceFEPIme  (MAXIPIME ) 1 g in sodium chloride  0.9 % 100 mL IVPB        1 g 200 mL/hr over 30 Minutes Intravenous  Once 12/30/23 1310 12/30/23 2005   12/29/23 2230  ceFEPIme  (MAXIPIME ) 1 g in sodium chloride  0.9 % 100 mL IVPB        1 g 200 mL/hr over 30 Minutes Intravenous  Once 12/29/23 2131 12/30/23 0656   12/29/23 1200  vancomycin  (VANCOREADY) IVPB 500 mg/100 mL  Status:  Discontinued        500 mg 100 mL/hr over 60 Minutes Intravenous Every M-W-F (Hemodialysis) 12/25/23 1313 12/26/23 1056   12/29/23 1200  ceFEPIme  (MAXIPIME ) 2 g in sodium chloride  0.9 % 100 mL IVPB  Status:  Discontinued        2 g 200 mL/hr over 30 Minutes Intravenous Every M-W-F (Hemodialysis) 12/26/23 1056 12/31/23 1221   12/29/23 1200  vancomycin  (VANCOREADY) IVPB 500 mg/100 mL  Status:  Discontinued        500 mg 100 mL/hr over 60 Minutes Intravenous Every M-W-F (Hemodialysis) 12/26/23 1056 12/31/23 1221   12/27/23 1400  ceFEPIme  (MAXIPIME ) 2 g in sodium chloride  0.9 % 100 mL IVPB        2 g 200 mL/hr over 30 Minutes Intravenous  Once 12/27/23 1319 12/27/23 1443   12/27/23 1200  vancomycin  (VANCOREADY) IVPB 500 mg/100 mL  Status:  Discontinued        500 mg 100 mL/hr over 60 Minutes  Intravenous Every Sat (Hemodialysis) 12/25/23 1313 12/26/23 1056   12/27/23 1200  ceFEPIme  (MAXIPIME ) 2 g in sodium chloride  0.9 % 100 mL IVPB  Status:  Discontinued        2 g 200 mL/hr over 30 Minutes Intravenous Every Sat (Hemodialysis) 12/26/23 1056 12/27/23 1318   12/27/23 1200  vancomycin  (VANCOREADY) IVPB 500 mg/100 mL        500 mg 100 mL/hr over 60 Minutes Intravenous Every Sat (Hemodialysis) 12/26/23 1056 12/27/23 1227   12/26/23 1700  ciprofloxacin  (CIPRO ) tablet 500 mg        500 mg Oral  Once 12/26/23 1056 12/26/23 1704   12/25/23 1915  ciprofloxacin  (CIPRO ) tablet 500 mg        500 mg Oral  Once 12/25/23 1819 12/25/23 1849   12/25/23 1800  ceFEPIme  (MAXIPIME ) 1 g in sodium chloride  0.9 % 100 mL IVPB  Status:  Discontinued        1 g 200 mL/hr over 30 Minutes Intravenous Every 24 hours 12/25/23 1316 12/26/23 1056   12/25/23 1200  ceFEPIme  (MAXIPIME ) 2 g in sodium chloride  0.9 % 100 mL IVPB  Status:  Discontinued        2 g 200 mL/hr over 30 Minutes Intravenous Once in dialysis 12/24/23 1739 12/25/23 1316   12/25/23 1200  vancomycin  (VANCOCIN ) IVPB 1000 mg/200 mL premix  Status:  Discontinued        1,000 mg 200 mL/hr over 60 Minutes Intravenous  Once 12/24/23 1739 12/24/23 1809   12/25/23 1200  vancomycin  (VANCOREADY) IVPB 500 mg/100 mL  Status:  Discontinued        500 mg 100 mL/hr over 60 Minutes Intravenous  Once 12/24/23 1809 12/25/23 1313   12/24/23 1200  ceFEPIme  (MAXIPIME ) 2 g in sodium chloride  0.9 % 100 mL IVPB  Status:  Discontinued        2 g 200 mL/hr over 30 Minutes Intravenous Every M-W-F (Hemodialysis) 12/22/23 0851 12/25/23 1316   12/19/23 0000  ceFEPime  (MAXIPIME ) IVPB        2 g Intravenous Every M-W-F (Hemodialysis) 12/19/23 1300 02/09/24 2359   12/19/23 0000  vancomycin  IVPB        500 mg Intravenous Every M-W-F (Hemodialysis) 12/19/23 1300 02/09/24 2359   12/16/23 2200  ceFEPIme  (MAXIPIME ) 1 g in sodium chloride  0.9 % 100 mL IVPB        1 g 200 mL/hr  over 30 Minutes Intravenous Every 24 hours 12/15/23 1348 12/22/23 2203  12/15/23 2230  vancomycin  (VANCOREADY) IVPB 500 mg/100 mL  Status:  Discontinued        500 mg 100 mL/hr over 60 Minutes Intravenous Every M-W-F (Hemodialysis) 12/15/23 2217 12/25/23 1313   12/15/23 1400  bictegravir-emtricitabine -tenofovir  AF (BIKTARVY ) 50-200-25 MG per tablet 1 tablet  Status:  Discontinued        1 tablet Oral Daily 12/15/23 1307 01/26/24 0853   12/15/23 1347  vancomycin  variable dose per unstable renal function (pharmacist dosing)  Status:  Discontinued         Does not apply See admin instructions 12/15/23 1348 12/15/23 2218   12/15/23 1100  ceFEPIme  (MAXIPIME ) 2 g in sodium chloride  0.9 % 100 mL IVPB        2 g 200 mL/hr over 30 Minutes Intravenous  Once 12/15/23 1048 12/15/23 1130   12/15/23 1100  vancomycin  (VANCOCIN ) IVPB 1000 mg/200 mL premix        1,000 mg 200 mL/hr over 60 Minutes Intravenous  Once 12/15/23 1048 12/15/23 1154       Subjective: Patient was seen and examined at bed side. Overnight events noted. Patient reports pain is not controlled and states his friend would be coming tomorrow.   Objective: Vitals:   02/09/24 1707 02/09/24 2027 02/10/24 0400 02/10/24 0708  BP: (!) 140/93 133/79 122/88 122/82  Pulse: 75 80 70 73  Resp: 16 16 15 17   Temp: (!) 97.5 F (36.4 C) 97.7 F (36.5 C) 97.8 F (36.6 C) 98 F (36.7 C)  TempSrc: Oral Oral Oral   SpO2: 100% 96% 97% 99%  Weight:      Height:        Intake/Output Summary (Last 24 hours) at 02/10/2024 1339 Last data filed at 02/09/2024 1500 Gross per 24 hour  Intake 120 ml  Output --  Net 120 ml   Filed Weights   02/05/24 0832 02/07/24 0836 02/07/24 1217  Weight: 42.2 kg 42.9 kg 42 kg    Examination:  General exam: Appears calm and comfortable, deconditioned, not in any acute distress. Respiratory system: CTA Bilaterally. Respiratory effort normal.  RR 15 Cardiovascular system: S1 & S2 heard, RRR. No JVD, murmurs,  rubs, gallops or clicks.  Gastrointestinal system: Abdomen is non distended, soft and non tender. Normal bowel sounds heard. Central nervous system: Alert and oriented x 2. No focal neurological deficits. Extremities: Bilateral BKA. Skin: No rashes, lesions or ulcers Psychiatry: Judgement and insight appear normal. Mood & affect appropriate.   Data Reviewed: I have personally reviewed following labs and imaging studies  CBC: Recent Labs  Lab 02/05/24 0557 02/07/24 0607  WBC 5.5 5.0  HGB 10.5* 10.3*  HCT 33.0* 32.1*  MCV 99.1 99.4  PLT 162 158   Basic Metabolic Panel: Recent Labs  Lab 02/05/24 0557 02/07/24 0607  NA 131* 132*  K 4.0 4.0  CL 94* 94*  CO2 22 25  GLUCOSE 123* 105*  BUN 29* 24*  CREATININE 3.64* 3.05*  CALCIUM  9.3 9.4  MG  --  2.2  PHOS  --  3.0   GFR: Estimated Creatinine Clearance: 16.6 mL/min (A) (by C-G formula based on SCr of 3.05 mg/dL (H)). Liver Function Tests: No results for input(s): AST, ALT, ALKPHOS, BILITOT, PROT, ALBUMIN  in the last 168 hours.  No results for input(s): LIPASE, AMYLASE in the last 168 hours. No results for input(s): AMMONIA in the last 168 hours. Coagulation Profile: No results for input(s): INR, PROTIME in the last 168 hours. Cardiac Enzymes: No results for  input(s): CKTOTAL, CKMB, CKMBINDEX, TROPONINI in the last 168 hours. BNP (last 3 results) No results for input(s): PROBNP in the last 8760 hours. HbA1C: No results for input(s): HGBA1C in the last 72 hours. CBG: Recent Labs  Lab 02/09/24 1620 02/09/24 2115 02/10/24 0636 02/10/24 1112 02/10/24 1154  GLUCAP 86 79 76 68* 78   Lipid Profile: No results for input(s): CHOL, HDL, LDLCALC, TRIG, CHOLHDL, LDLDIRECT in the last 72 hours. Thyroid Function Tests: No results for input(s): TSH, T4TOTAL, FREET4, T3FREE, THYROIDAB in the last 72 hours. Anemia Panel: No results for input(s): VITAMINB12, FOLATE,  FERRITIN, TIBC, IRON , RETICCTPCT in the last 72 hours. Sepsis Labs: No results for input(s): PROCALCITON, LATICACIDVEN in the last 168 hours.  No results found for this or any previous visit (from the past 240 hours).   Radiology Studies: No results found.  Scheduled Meds:  (feeding supplement) PROSource Plus  30 mL Oral Daily   acetaminophen   1,000 mg Oral TID   artificial tears   Right Eye QID   bictegravir-emtricitabine -tenofovir  AF  1 tablet Oral Daily   Chlorhexidine  Gluconate Cloth  6 each Topical Q0600   darbepoetin (ARANESP ) injection - DIALYSIS  150 mcg Subcutaneous Q Fri-1800   feeding supplement (NEPRO CARB STEADY)  237 mL Oral BID AC & HS   fentaNYL   1 patch Transdermal Q72H   gabapentin   200 mg Oral TID   gatifloxacin   1 drop Right Eye BID   heparin   5,000 Units Subcutaneous Q8H   insulin  aspart  0-5 Units Subcutaneous QHS   insulin  aspart  0-6 Units Subcutaneous TID WC   lidocaine   2 patch Transdermal Q24H   methocarbamol   500 mg Oral TID   midodrine   10 mg Oral TID WC   midodrine   10 mg Oral Q T,Th,Sa-HD   multivitamin  1 tablet Oral QHS   pantoprazole   40 mg Oral BID AC   polyethylene glycol  17 g Oral Daily   sevelamer  carbonate  800 mg Oral TID WC   Continuous Infusions:  [START ON 02/12/2024] vancomycin        LOS: 57 days    Time spent: 35 mins    Darcel Dawley, MD [facility]   If 7PM-7AM, please contact night-coverage

## 2024-02-10 NOTE — Procedures (Signed)
 Received patient in bed to unit.  Alert and oriented.  Informed consent signed and in chart.   TX duration:3 hrs  Patient's UF was turned off d/t pt c/o of dizziness and drop in BP pressure. Pt received saline bolus during tx to bring BP up. Pt has no c/o of dizziness or lightheadedness as of now.  Transported back to the room  Alert, without acute distress.  Hand-off given to patient's nurse.   Access used: R IJ Access issues: None  Total UF removed: Unable to remove any fluid d/t drop in BP Medication(s) given:  Post HD weight:  Post HD VS:    [facility]

## 2024-02-10 NOTE — Progress Notes (Signed)
 [facility] Progress Note   Subjective:   Seen in room - for HD later today. C/o both hips hurting today. No CP/dyspnea.  Objective Vitals:   02/09/24 1707 02/09/24 2027 02/10/24 0400 02/10/24 0708  BP: (!) 140/93 133/79 122/88 122/82  Pulse: 75 80 70 73  Resp: 16 16 15 17   Temp: (!) 97.5 F (36.4 C) 97.7 F (36.5 C) 97.8 F (36.6 C) 98 F (36.7 C)  TempSrc: Oral Oral Oral   SpO2: 100% 96% 97% 99%  Weight:      Height:       Physical Exam General: Chronically ill, frail man, NAD Heart: RRR Lungs: CTAB Abdomen: soft Extremities: no LE edema, B BKA Dialysis Access: [facility]  Additional Objective Labs: Basic Metabolic Panel: Recent Labs  Lab 02/05/24 0557 02/07/24 0607  NA 131* 132*  K 4.0 4.0  CL 94* 94*  CO2 22 25  GLUCOSE 123* 105*  BUN 29* 24*  CREATININE 3.64* 3.05*  CALCIUM  9.3 9.4  PHOS  --  3.0   CBC: Recent Labs  Lab 02/05/24 0557 02/07/24 0607  WBC 5.5 5.0  HGB 10.5* 10.3*  HCT 33.0* 32.1*  MCV 99.1 99.4  PLT 162 158   Medications:  [START ON 02/12/2024] vancomycin       (feeding supplement) PROSource Plus  30 mL Oral Daily   acetaminophen   1,000 mg Oral TID   artificial tears   Right Eye QID   bictegravir-emtricitabine -tenofovir  AF  1 tablet Oral Daily   Chlorhexidine  Gluconate Cloth  6 each Topical Q0600   darbepoetin (ARANESP ) injection - DIALYSIS  150 mcg Subcutaneous Q Fri-1800   feeding supplement (NEPRO CARB STEADY)  237 mL Oral BID AC & HS   fentaNYL   1 patch Transdermal Q72H   gabapentin   200 mg Oral TID   gatifloxacin   1 drop Right Eye BID   heparin   5,000 Units Subcutaneous Q8H   insulin  aspart  0-5 Units Subcutaneous QHS   insulin  aspart  0-6 Units Subcutaneous TID WC   lidocaine   2 patch Transdermal Q24H   methocarbamol   500 mg Oral TID   midodrine   10 mg Oral TID WC   midodrine   10 mg Oral Q T,Th,Sa-HD   multivitamin  1 tablet Oral QHS   pantoprazole   40 mg Oral BID AC   polyethylene glycol  17 g Oral Daily    sevelamer  carbonate  800 mg Oral TID WC    Dialysis Orders [facility] - MWF 3.5h  B400  45.5kg  TDC  Heparin  2000 Mircera 100 mcg - last dose 12/01/23 Calcitriol  1.0 mcg (on hold due to high Ca) Uses midodrine  10mg  TID + extra pre-HD    Assessment/Plan: L4-5 discitis/osteomyelitis/psoas abscess: Vanc/Cefepime  through 8/28, now Vanc 500mg  q HD alone until 02/18/24 per ID. S/p IR aspiration 8/27 - growing MRSA. Not a surgical candidate. Pain management per primary, unclear if pain will improve. Plan is repeat imaging in the near future. ESRD: Historically MWF schedule - on TTS this admit, insistent to stay on that schedule - for HD today. BP/volume: Chronic hypotension on mido 10mg  TID and extra pre-HD. Below EDW and will need to be lowered at dc.  Anemia of ESRD: Hgb 10.3 - continue Aranesp  150mcg q Friday. No IV iron  d/t infection. HIV: controlled on Biktarvy  BMD: CorrCa high, VDRA on hold. Phos on goal, on sevelamer  as binder.  Nutrition: Alb low, continue supps and diet has been liberalized. Debility: PT/OT as tolerated. Dispo: Very difficult placement. Noted  patient is uninsured thus SNF is not an option.  No family or safe dispo plan at this time.   Izetta Boehringer, PA-C 02/10/2024, 1:25 PM  [employer]

## 2024-02-10 NOTE — Progress Notes (Addendum)
 Pharmacy Antibiotic Note  Alvin Daniels is a 53 y.o. male admitted on 12/14/2023 with osteomyelitis.  Pharmacy has been consulted for vancomycin  dosing. Last dose was 500mg  IV with HD session on 9/27. Today, level is 28. Will defer dosing until next dialysis session as patient's predicted post HD level remains within therapeutic range.   Plan: Defer today's vancomycin  dose.  Goal pre-HD level: 15-25 mcg/mL.  Height: 4' 11 (149.9 cm) Weight: 42 kg (92 lb 9.5 oz) IBW/kg (Calculated) : 47.7  Temp (24hrs), Avg:97.8 F (36.6 C), Min:97.5 F (36.4 C), Max:98 F (36.7 C)  Recent Labs  Lab 02/05/24 0557 02/07/24 0607 02/10/24 0447  WBC 5.5 5.0  --   CREATININE 3.64* 3.05*  --   VANCORANDOM  --   --  28    Estimated Creatinine Clearance: 16.6 mL/min (A) (by C-G formula based on SCr of 3.05 mg/dL (H)).    No Known Allergies   Microbiology results: 8/4 BCx: ngtd 8/27 Wound Cx: MRSA   Thank you for allowing pharmacy to be a part of this patient's care.  Elma Fail, PharmD PGY1 Clinical Pharmacist [facility]  02/10/2024 8:17 AM

## 2024-02-11 LAB — GLUCOSE, CAPILLARY
Glucose-Capillary: 62 mg/dL — ABNORMAL LOW (ref 70–99)
Glucose-Capillary: 83 mg/dL (ref 70–99)
Glucose-Capillary: 85 mg/dL (ref 70–99)
Glucose-Capillary: 70 mg/dL (ref 70–99)
Glucose-Capillary: 74 mg/dL (ref 70–99)

## 2024-02-11 LAB — PHOSPHORUS: Phosphorus: 2.6 mg/dL (ref 2.5–4.6)

## 2024-02-11 LAB — MAGNESIUM: Magnesium: 1.9 mg/dL (ref 1.7–2.4)

## 2024-02-11 NOTE — Plan of Care (Signed)
  Problem: Education: Goal: Knowledge of General Education information will improve Description: Including pain rating scale, medication(s)/side effects and non-pharmacologic comfort measures Outcome: Progressing   Problem: Health Behavior/Discharge Planning: Goal: Ability to manage health-related needs will improve Outcome: Progressing   Problem: Activity: Goal: Risk for activity intolerance will decrease Outcome: Progressing   Problem: Coping: Goal: Level of anxiety will decrease Outcome: Progressing   Problem: Health Behavior/Discharge Planning: Goal: Ability to identify and utilize available resources and services will improve Outcome: Progressing

## 2024-02-11 NOTE — Progress Notes (Signed)
 PROGRESS NOTE    Alvin Daniels  FMW:[id number] DOB: 08/18/1970 DOA: 12/14/2023 PCP: Pcp, No   Brief Narrative:  This 53 yrs old male with PMH significant for PAD s/p bilateral BKA, ESRD on HD, HIV, chronic HCV-recent hospitalization from 5/28-6/6 for MRSA bacteremia with left fourth finger osteomyelitis (s/p amputation 5/29)-presented to the hospital with right hip pain. Patient was found to have L4-L5 osteomyelitis with psoas abscess.  Patient was admitted for further evaluation and orthopedics is consulted.   Significant studies: 6/4>> TEE: EF 60-65%, 1.8 x 1.2 mass right atrial wall- vegetation from old HD catheter impinged on RA wall-versus mass/tumor. 8/4>> MRI pelvis: Discitis/osteomyelitis L4-5-right psoas abscess. 8/5>> CT abdomen/pelvis: Discitis/osteomyelitis L4-5 with paraspinal inflammatory changes/right psoas abscess.  8/8>> MRI LS spine: L4-5 discitis/osteomyelitis with associated paraspinal inflammatory changes, right psoas abscess. 8/20>> MRI left hip: Discitis of the lower lumbar spine with extension into the psoas muscle. 8/20>> MRI left knee: BKA-no significant abnormality. 8/25>> MRI LS spine: Persistent/ongoing changes of osteomyelitis at L4/L5 with associated epidural phlegmon/abscess within the ventral epidural space at L4/L5.  Associated multiloculated bilateral psoas abscess measuring up to 3.1 cm on the left.   Significant microbiology data: 5/28>> blood culture: MRSA 5/29>> left finger abscess: E. coli/Streptococcus anginosus, Bacteroides fragilis. 8/4>> blood culture: No growth 8/27 >> L4-L5 disc aspirate culture: MRSA.     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 coli on bone culture) As per prior notes,  Patient is not a surgical candidate. IR was able to aspirate from L4-L5 >positive for MRSA on 01/07/24. As per last ID note from Dr. Overton, recommended 6 weeks of IV vancomycin  from 8/27 -EOT around 02/18/24. Will need reimaging at some point-closer to antibiotic EOT    Chronic hypotension: Continue midodrine .   ESRD on HD MWF: Nephrology following.   Normocytic anemia: Secondary to ESRD Hb stable. Continue Aranesp /iron  defer to nephrology service.   PAD-s/p bilateral BKA: Normally uses prosthesis. Telling some staff that he 'hasn't walked in years' . Renal navigator seeking collateral information from dialysis center. Due to severe pain-He has been refusing to mobilize with PT  Note-has been refusing physical therapy-multiple times-they have signed off.   HIV (CD4 254 on 5/29) Continue HAART.   Chronic HCV: Outpatient follow-up with infectious disease.   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 Continue moxifloxacin eyedrops twice daily OD, ophthalmic lubricant 4 times daily OD.   Chronic intrahepatic /extrahepatic biliary dilatation: Incidental finding Per radiology-similar to prior CTs.   Difficult disposition: Lives alone, has no friends, unclear if he has family - uninsured-SNF not a option per SW -history of ESRD with bilateral BKA -currently claims unable to ambulate with his prostheses, can't bring prostheses -hence unable to be discharged home safely.   Patient has been refusing PT/OT-multiple times-has resolved-PT/OT have  signed off. Normally-prior to this hospitalization was able to  ambulate with prosthesis and catch the bus/scat transport to go back and forth from dialysis center.    DM-2 (A1c 10.0 on 3/6) CBG stable. Continue SSI    DVT prophylaxis: Heparin  sq Code Status: Full code Family Communication: No family at bed side. Disposition Plan:    Status is: Inpatient Remains inpatient appropriate because: Severity of illness. Patient is homeless, No Family, Bilateral BKA.   Consultants:  ID/nephrology/ophthalmology/interventional radiology.    Procedures:   8/27 >> IR aspiration 8/27 of of L4-L5 disc aspirate  Antimicrobials:  Anti-infectives (From admission, onward)    Start     Dose/Rate Route Frequency Ordered Stop   02/12/24 1800  vancomycin  (VANCOREADY) IVPB 500 mg/100 mL        500 mg 100 mL/hr over 60 Minutes Intravenous Every T-Th-Sa (Hemodialysis) 02/10/24 0958     01/26/24 1800  bictegravir-emtricitabine -tenofovir  AF (BIKTARVY ) 50-200-25 MG per tablet 1 tablet        1 tablet Oral Daily 01/26/24 0853     01/06/24 1800  ceFEPIme  (MAXIPIME ) 2 g in sodium chloride  0.9 % 100 mL IVPB  Status:  Discontinued        2 g 200 mL/hr over 30 Minutes Intravenous Every T-Th-Sa (1800) 01/05/24 1131 01/08/24 1053   01/06/24 1200  vancomycin  (VANCOREADY) IVPB 500 mg/100 mL  Status:  Discontinued        500 mg 100 mL/hr over 60 Minutes Intravenous Every T-Th-Sa (Hemodialysis) 01/05/24 1130 02/10/24 0958   01/03/24 1200  vancomycin  (VANCOREADY) IVPB 500 mg/100 mL        500 mg 100 mL/hr over 60 Minutes Intravenous  Once 01/02/24 1052 01/03/24 1414   01/03/24 1200  ceFEPIme  (MAXIPIME ) 2 g in sodium chloride  0.9 % 100 mL IVPB        2 g 200 mL/hr over 30 Minutes Intravenous  Once 01/02/24 1212 01/03/24 1343   01/02/24 1200  ceFEPIme  (MAXIPIME ) 2 g in sodium chloride  0.9 % 100 mL IVPB  Status:  Discontinued        2 g 200 mL/hr over 30 Minutes Intravenous Every M-W-F (Hemodialysis) 12/31/23  1221 01/05/24 1131   01/02/24 1200  vancomycin  (VANCOREADY) IVPB 500 mg/100 mL  Status:  Discontinued        500 mg 100 mL/hr over 60 Minutes Intravenous Every M-W-F (Hemodialysis) 12/31/23 1221 01/05/24 1130   01/01/24 1200  vancomycin  (VANCOREADY) IVPB 500 mg/100 mL        500 mg 100 mL/hr over 60 Minutes Intravenous Every T-Th-Sa (Hemodialysis) 12/31/23 1219 01/01/24 1609   01/01/24 1200  ceFEPIme  (MAXIPIME ) 2 g in sodium chloride  0.9 % 100 mL IVPB        2 g 200 mL/hr over 30 Minutes Intravenous Once in dialysis 12/31/23 1219 01/01/24 1840   12/30/23 1400  vancomycin  (VANCOREADY) IVPB 500 mg/100 mL        500 mg 100 mL/hr over 60 Minutes Intravenous  Once 12/30/23 1310 12/30/23 2033   12/30/23 1400  ceFEPIme  (MAXIPIME ) 1 g in sodium chloride  0.9 % 100 mL IVPB        1 g 200 mL/hr over 30 Minutes Intravenous  Once 12/30/23 1310 12/30/23 2005   12/29/23 2230  ceFEPIme  (MAXIPIME ) 1 g in sodium chloride  0.9 % 100 mL IVPB        1 g 200 mL/hr over 30 Minutes Intravenous  Once 12/29/23 2131 12/30/23 0656   12/29/23 1200  vancomycin  (VANCOREADY) IVPB 500 mg/100 mL  Status:  Discontinued  500 mg 100 mL/hr over 60 Minutes Intravenous Every M-W-F (Hemodialysis) 12/25/23 1313 12/26/23 1056   12/29/23 1200  ceFEPIme  (MAXIPIME ) 2 g in sodium chloride  0.9 % 100 mL IVPB  Status:  Discontinued        2 g 200 mL/hr over 30 Minutes Intravenous Every M-W-F (Hemodialysis) 12/26/23 1056 12/31/23 1221   12/29/23 1200  vancomycin  (VANCOREADY) IVPB 500 mg/100 mL  Status:  Discontinued        500 mg 100 mL/hr over 60 Minutes Intravenous Every M-W-F (Hemodialysis) 12/26/23 1056 12/31/23 1221   12/27/23 1400  ceFEPIme  (MAXIPIME ) 2 g in sodium chloride  0.9 % 100 mL IVPB        2 g 200 mL/hr over 30 Minutes Intravenous  Once 12/27/23 1319 12/27/23 1443   12/27/23 1200  vancomycin  (VANCOREADY) IVPB 500 mg/100 mL  Status:  Discontinued        500 mg 100 mL/hr over 60 Minutes Intravenous Every Sat  (Hemodialysis) 12/25/23 1313 12/26/23 1056   12/27/23 1200  ceFEPIme  (MAXIPIME ) 2 g in sodium chloride  0.9 % 100 mL IVPB  Status:  Discontinued        2 g 200 mL/hr over 30 Minutes Intravenous Every Sat (Hemodialysis) 12/26/23 1056 12/27/23 1318   12/27/23 1200  vancomycin  (VANCOREADY) IVPB 500 mg/100 mL        500 mg 100 mL/hr over 60 Minutes Intravenous Every Sat (Hemodialysis) 12/26/23 1056 12/27/23 1227   12/26/23 1700  ciprofloxacin  (CIPRO ) tablet 500 mg        500 mg Oral  Once 12/26/23 1056 12/26/23 1704   12/25/23 1915  ciprofloxacin  (CIPRO ) tablet 500 mg        500 mg Oral  Once 12/25/23 1819 12/25/23 1849   12/25/23 1800  ceFEPIme  (MAXIPIME ) 1 g in sodium chloride  0.9 % 100 mL IVPB  Status:  Discontinued        1 g 200 mL/hr over 30 Minutes Intravenous Every 24 hours 12/25/23 1316 12/26/23 1056   12/25/23 1200  ceFEPIme  (MAXIPIME ) 2 g in sodium chloride  0.9 % 100 mL IVPB  Status:  Discontinued        2 g 200 mL/hr over 30 Minutes Intravenous Once in dialysis 12/24/23 1739 12/25/23 1316   12/25/23 1200  vancomycin  (VANCOCIN ) IVPB 1000 mg/200 mL premix  Status:  Discontinued        1,000 mg 200 mL/hr over 60 Minutes Intravenous  Once 12/24/23 1739 12/24/23 1809   12/25/23 1200  vancomycin  (VANCOREADY) IVPB 500 mg/100 mL  Status:  Discontinued        500 mg 100 mL/hr over 60 Minutes Intravenous  Once 12/24/23 1809 12/25/23 1313   12/24/23 1200  ceFEPIme  (MAXIPIME ) 2 g in sodium chloride  0.9 % 100 mL IVPB  Status:  Discontinued        2 g 200 mL/hr over 30 Minutes Intravenous Every M-W-F (Hemodialysis) 12/22/23 0851 12/25/23 1316   12/19/23 0000  ceFEPime  (MAXIPIME ) IVPB        2 g Intravenous Every M-W-F (Hemodialysis) 12/19/23 1300 02/09/24 2359   12/19/23 0000  vancomycin  IVPB        500 mg Intravenous Every M-W-F (Hemodialysis) 12/19/23 1300 02/09/24 2359   12/16/23 2200  ceFEPIme  (MAXIPIME ) 1 g in sodium chloride  0.9 % 100 mL IVPB        1 g 200 mL/hr over 30 Minutes  Intravenous Every 24 hours 12/15/23 1348 12/22/23 2203   12/15/23 2230  vancomycin  (VANCOREADY) IVPB 500 mg/100  mL  Status:  Discontinued        500 mg 100 mL/hr over 60 Minutes Intravenous Every M-W-F (Hemodialysis) 12/15/23 2217 12/25/23 1313   12/15/23 1400  bictegravir-emtricitabine -tenofovir  AF (BIKTARVY ) 50-200-25 MG per tablet 1 tablet  Status:  Discontinued        1 tablet Oral Daily 12/15/23 1307 01/26/24 0853   12/15/23 1347  vancomycin  variable dose per unstable renal function (pharmacist dosing)  Status:  Discontinued         Does not apply See admin instructions 12/15/23 1348 12/15/23 2218   12/15/23 1100  ceFEPIme  (MAXIPIME ) 2 g in sodium chloride  0.9 % 100 mL IVPB        2 g 200 mL/hr over 30 Minutes Intravenous  Once 12/15/23 1048 12/15/23 1130   12/15/23 1100  vancomycin  (VANCOCIN ) IVPB 1000 mg/200 mL premix        1,000 mg 200 mL/hr over 60 Minutes Intravenous  Once 12/15/23 1048 12/15/23 1154       Subjective: Patient was seen and examined at bed side.  Discussed with nursing.  No acute issues overnight.  Patient appears comfortable.  Could not use the interpreter service today  Objective: Vitals:   02/10/24 1845 02/10/24 1849 02/11/24 0400 02/11/24 0723  BP: 119/73 110/62 119/81 135/83  Pulse: 73 76 73 79  Resp: 10 (!) 21 16 16   Temp: 97.8 F (36.6 C)  97.8 F (36.6 C) 97.6 F (36.4 C)  TempSrc:   Oral Oral  SpO2: 100% 100% 100%   Weight:      Height:        Intake/Output Summary (Last 24 hours) at 02/11/2024 0920 Last data filed at 02/11/2024 0700 Gross per 24 hour  Intake 120 ml  Output 0 ml  Net 120 ml   Filed Weights   02/05/24 0832 02/07/24 0836 02/07/24 1217  Weight: 42.2 kg 42.9 kg 42 kg    Examination:  General: Chronically ill looking: HEENT: L eye cataract Chest: Clear bilaterally CVS: S1, S2, no murmur, regular rhythm Abdomen: Soft, nontender Extremities: Bilateral BKA  Data Reviewed: I have personally reviewed following labs and  imaging studies  CBC: Recent Labs  Lab 02/05/24 0557 02/07/24 0607 02/10/24 1525  WBC 5.5 5.0 5.2  HGB 10.5* 10.3* 10.2*  HCT 33.0* 32.1* 31.8*  MCV 99.1 99.4 100.3*  PLT 162 158 133*   Basic Metabolic Panel: Recent Labs  Lab 02/05/24 0557 02/07/24 0607 02/10/24 1525 02/11/24 0506  NA 131* 132* 130*  --   K 4.0 4.0 4.5  --   CL 94* 94* 93*  --   CO2 22 25 23   --   GLUCOSE 123* 105* 91  --   BUN 29* 24* 45*  --   CREATININE 3.64* 3.05* 4.00*  --   CALCIUM  9.3 9.4 9.6  --   MG  --  2.2  --  1.9  PHOS  --  3.0 4.1 2.6   GFR: Estimated Creatinine Clearance: 12.7 mL/min (A) (by C-G formula based on SCr of 4 mg/dL (H)). Liver Function Tests: Recent Labs  Lab 02/10/24 1525  ALBUMIN  2.5*    No results for input(s): LIPASE, AMYLASE in the last 168 hours. No results for input(s): AMMONIA in the last 168 hours. Coagulation Profile: No results for input(s): INR, PROTIME in the last 168 hours. Cardiac Enzymes: No results for input(s): CKTOTAL, CKMB, CKMBINDEX, TROPONINI in the last 168 hours. BNP (last 3 results) No results for input(s): PROBNP in the last  8760 hours. HbA1C: No results for input(s): HGBA1C in the last 72 hours. CBG: Recent Labs  Lab 02/10/24 1154 02/10/24 2114 02/10/24 2305 02/11/24 0605 02/11/24 0705  GLUCAP 78 56* 77 62* 83   Lipid Profile: No results for input(s): CHOL, HDL, LDLCALC, TRIG, CHOLHDL, LDLDIRECT in the last 72 hours. Thyroid Function Tests: No results for input(s): TSH, T4TOTAL, FREET4, T3FREE, THYROIDAB in the last 72 hours. Anemia Panel: No results for input(s): VITAMINB12, FOLATE, FERRITIN, TIBC, IRON , RETICCTPCT in the last 72 hours. Sepsis Labs: No results for input(s): PROCALCITON, LATICACIDVEN in the last 168 hours.  No results found for this or any previous visit (from the past 240 hours).   Radiology Studies: No results found.  Scheduled Meds:  (feeding  supplement) PROSource Plus  30 mL Oral Daily   acetaminophen   1,000 mg Oral TID   artificial tears   Right Eye QID   bictegravir-emtricitabine -tenofovir  AF  1 tablet Oral Daily   Chlorhexidine  Gluconate Cloth  6 each Topical Q0600   darbepoetin (ARANESP ) injection - DIALYSIS  150 mcg Subcutaneous Q Fri-1800   feeding supplement (NEPRO CARB STEADY)  237 mL Oral BID AC & HS   fentaNYL   1 patch Transdermal Q72H   gabapentin   200 mg Oral TID   gatifloxacin   1 drop Right Eye BID   heparin   5,000 Units Subcutaneous Q8H   lidocaine   2 patch Transdermal Q24H   methocarbamol   500 mg Oral TID   midodrine   10 mg Oral TID WC   midodrine   10 mg Oral Q T,Th,Sa-HD   multivitamin  1 tablet Oral QHS   pantoprazole   40 mg Oral BID AC   polyethylene glycol  17 g Oral Daily   sevelamer  carbonate  800 mg Oral TID WC   Continuous Infusions:  [START ON 02/12/2024] vancomycin        LOS: 58 days    Time spent: 35 mins    Derryl Duval, MD [facility]   If 7PM-7AM, please contact night-coverage

## 2024-02-11 NOTE — Progress Notes (Signed)
 [facility] Progress Note   Subjective:    Completed HD Tues - no UF Seen in room. Ongoing back pain  Objective Vitals:   02/10/24 1845 02/10/24 1849 02/11/24 0400 02/11/24 0723  BP: 119/73 110/62 119/81 135/83  Pulse: 73 76 73 79  Resp: 10 (!) 21 16 16   Temp: 97.8 F (36.6 C)  97.8 F (36.6 C) 97.6 F (36.4 C)  TempSrc:   Oral Oral  SpO2: 100% 100% 100%   Weight:      Height:       Physical Exam General: Chronically ill, frail man, NAD Heart: RRR Lungs: CTAB Abdomen: soft Extremities: no LE edema, B BKA Dialysis Access: [facility]  Additional Objective Labs: Basic Metabolic Panel: Recent Labs  Lab 02/05/24 0557 02/07/24 0607 02/10/24 1525 02/11/24 0506  NA 131* 132* 130*  --   K 4.0 4.0 4.5  --   CL 94* 94* 93*  --   CO2 22 25 23   --   GLUCOSE 123* 105* 91  --   BUN 29* 24* 45*  --   CREATININE 3.64* 3.05* 4.00*  --   CALCIUM  9.3 9.4 9.6  --   PHOS  --  3.0 4.1 2.6   CBC: Recent Labs  Lab 02/05/24 0557 02/07/24 0607 02/10/24 1525  WBC 5.5 5.0 5.2  HGB 10.5* 10.3* 10.2*  HCT 33.0* 32.1* 31.8*  MCV 99.1 99.4 100.3*  PLT 162 158 133*   Medications:  [START ON 02/12/2024] vancomycin       (feeding supplement) PROSource Plus  30 mL Oral Daily   acetaminophen   1,000 mg Oral TID   artificial tears   Right Eye QID   bictegravir-emtricitabine -tenofovir  AF  1 tablet Oral Daily   Chlorhexidine  Gluconate Cloth  6 each Topical Q0600   darbepoetin (ARANESP ) injection - DIALYSIS  150 mcg Subcutaneous Q Fri-1800   feeding supplement (NEPRO CARB STEADY)  237 mL Oral BID AC & HS   fentaNYL   1 patch Transdermal Q72H   gabapentin   200 mg Oral TID   gatifloxacin   1 drop Right Eye BID   heparin   5,000 Units Subcutaneous Q8H   lidocaine   2 patch Transdermal Q24H   methocarbamol   500 mg Oral TID   midodrine   10 mg Oral TID WC   midodrine   10 mg Oral Q T,Th,Sa-HD   multivitamin  1 tablet Oral QHS   pantoprazole   40 mg Oral BID AC   polyethylene glycol   17 g Oral Daily   sevelamer  carbonate  800 mg Oral TID WC    Dialysis Orders [facility] - MWF 3.5h  B400  45.5kg  TDC  Heparin  2000 Mircera 100 mcg - last dose 12/01/23 Calcitriol  1.0 mcg (on hold due to high Ca) Uses midodrine  10mg  TID + extra pre-HD    Assessment/Plan: L4-5 discitis/osteomyelitis/psoas abscess: Vanc/Cefepime  through 8/28, now Vanc 500mg  q HD alone until 02/18/24 per ID. S/p IR aspiration 8/27 - growing MRSA. Not a surgical candidate. Pain management per primary, unclear if pain will improve. Plan is repeat imaging in the near future. ESRD: Historically MWF schedule - on TTS this admit, insistent to stay on that schedule. HD 10/2.  BP/volume: Chronic hypotension on mido 10mg  TID and extra pre-HD. Below EDW now.  Anemia of ESRD: Hgb 10.3 - continue Aranesp  150mcg q Friday. No IV iron  d/t infection. HIV: controlled on Biktarvy  BMD: CorrCa high, VDRA on hold. Phos on goal, on sevelamer  as binder.  Nutrition: Alb low, continue supps and diet  has been liberalized. Debility: PT/OT as tolerated. Dispo: Very difficult placement. Noted patient is uninsured thus SNF is not an option.  No family or safe dispo plan at this time.  Maisie Ronnald Acosta PA-C [facility] 02/11/2024,12:29 PM

## 2024-02-12 ENCOUNTER — Inpatient Hospital Stay: Payer: Self-pay | Admitting: Family Medicine

## 2024-02-12 LAB — GLUCOSE, CAPILLARY
Glucose-Capillary: 53 mg/dL — ABNORMAL LOW (ref 70–99)
Glucose-Capillary: 46 mg/dL — ABNORMAL LOW (ref 70–99)
Glucose-Capillary: 143 mg/dL — ABNORMAL HIGH (ref 70–99)
Glucose-Capillary: 77 mg/dL (ref 70–99)
Glucose-Capillary: 60 mg/dL — ABNORMAL LOW (ref 70–99)

## 2024-02-12 LAB — CBC
HCT: 31.7 % — ABNORMAL LOW (ref 39.0–52.0)
Hemoglobin: 10.2 g/dL — ABNORMAL LOW (ref 13.0–17.0)
MCH: 32 pg (ref 26.0–34.0)
MCHC: 32.2 g/dL (ref 30.0–36.0)
MCV: 99.4 fL (ref 80.0–100.0)
Platelets: 122 K/uL — ABNORMAL LOW (ref 150–400)
RBC: 3.19 MIL/uL — ABNORMAL LOW (ref 4.22–5.81)
RDW: 20.2 % — ABNORMAL HIGH (ref 11.5–15.5)
WBC: 8.1 K/uL (ref 4.0–10.5)
nRBC: 0 % (ref 0.0–0.2)

## 2024-02-12 LAB — RENAL FUNCTION PANEL
Albumin: 2.3 g/dL — ABNORMAL LOW (ref 3.5–5.0)
Anion gap: 10 (ref 5–15)
BUN: 38 mg/dL — ABNORMAL HIGH (ref 6–20)
CO2: 23 mmol/L (ref 22–32)
Calcium: 9.5 mg/dL (ref 8.9–10.3)
Chloride: 96 mmol/L — ABNORMAL LOW (ref 98–111)
Creatinine, Ser: 3.45 mg/dL — ABNORMAL HIGH (ref 0.61–1.24)
GFR, Estimated: 20 mL/min — ABNORMAL LOW (ref >60)
Glucose, Bld: 62 mg/dL — ABNORMAL LOW (ref 70–99)
Phosphorus: 3.1 mg/dL (ref 2.5–4.6)
Potassium: 4.5 mmol/L (ref 3.5–5.1)
Sodium: 129 mmol/L — ABNORMAL LOW (ref 135–145)

## 2024-02-12 MED ORDER — ALTEPLASE 2 MG IJ SOLR: 2.0000 mg | Freq: Once | INTRAMUSCULAR | Status: DC | PRN

## 2024-02-12 MED ORDER — HEPARIN SODIUM (PORCINE) 1000 UNIT/ML IJ SOLN
INTRAMUSCULAR | Status: AC
  Filled 2024-02-12: qty 4

## 2024-02-12 MED ORDER — PENTAFLUOROPROP-TETRAFLUOROETH EX AERO: 1.0000 | INHALATION_SPRAY | CUTANEOUS | Status: DC | PRN

## 2024-02-12 MED ORDER — LIDOCAINE HCL (PF) 1 % IJ SOLN: 5.0000 mL | INTRAMUSCULAR | Status: DC | PRN

## 2024-02-12 MED ORDER — HEPARIN SODIUM (PORCINE) 1000 UNIT/ML DIALYSIS: 1000.0000 [IU] | INTRAMUSCULAR | Status: DC | PRN

## 2024-02-12 MED ORDER — HEPARIN SODIUM (PORCINE) 1000 UNIT/ML IJ SOLN
INTRAMUSCULAR | Status: AC
  Filled 2024-02-12: qty 1

## 2024-02-12 MED ORDER — ANTICOAGULANT SODIUM CITRATE 4% (200MG/5ML) IV SOLN: 5.0000 mL | Status: DC | PRN

## 2024-02-12 MED ORDER — HEPARIN SODIUM (PORCINE) 1000 UNIT/ML DIALYSIS
20.0000 [IU]/kg | INTRAMUSCULAR | Status: DC | PRN
  Administered 2024-02-12: 800 [IU] via INTRAVENOUS_CENTRAL

## 2024-02-12 MED ORDER — LIDOCAINE-PRILOCAINE 2.5-2.5 % EX CREA: 1.0000 | TOPICAL_CREAM | CUTANEOUS | Status: DC | PRN

## 2024-02-12 NOTE — Plan of Care (Signed)
   Problem: Coping: Goal: Level of anxiety will decrease Outcome: Progressing   Problem: Pain Managment: Goal: General experience of comfort will improve and/or be controlled Outcome: Progressing   Problem: Safety: Goal: Ability to remain free from injury will improve Outcome: Progressing

## 2024-02-12 NOTE — Progress Notes (Signed)
 PROGRESS NOTE    Alvin Daniels  FMW:[id number] DOB: 1971-05-09 DOA: 12/14/2023 PCP: Pcp, No   Brief Narrative:  This 53 yrs old male with PMH significant for PAD s/p bilateral BKA, ESRD on HD, HIV, chronic HCV-recent hospitalization from 5/28-6/6 for MRSA bacteremia with left fourth finger osteomyelitis (s/p amputation 5/29)-presented to the hospital with right hip pain. Patient was found to have L4-L5 osteomyelitis with psoas abscess.  Patient was admitted for further evaluation and orthopedics is consulted.   Significant studies: 6/4>> TEE: EF 60-65%, 1.8 x 1.2 mass right atrial wall- vegetation from old HD catheter impinged on RA wall-versus mass/tumor. 8/4>> MRI pelvis: Discitis/osteomyelitis L4-5-right psoas abscess. 8/5>> CT abdomen/pelvis: Discitis/osteomyelitis L4-5 with paraspinal inflammatory changes/right psoas abscess.  8/8>> MRI LS spine: L4-5 discitis/osteomyelitis with associated paraspinal inflammatory changes, right psoas abscess. 8/20>> MRI left hip: Discitis of the lower lumbar spine with extension into the psoas muscle. 8/20>> MRI left knee: BKA-no significant abnormality. 8/25>> MRI LS spine: Persistent/ongoing changes of osteomyelitis at L4/L5 with associated epidural phlegmon/abscess within the ventral epidural space at L4/L5.  Associated multiloculated bilateral psoas abscess measuring up to 3.1 cm on the left.   Significant microbiology data: 5/28>> blood culture: MRSA 5/29>> left finger abscess: E. coli/Streptococcus anginosus, Bacteroides fragilis. 8/4>> blood culture: No growth 8/27 >> L4-L5 disc aspirate culture: MRSA.     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 coli on bone culture) As per prior notes,  Patient is not a surgical candidate. IR was able to aspirate from L4-L5 >positive for MRSA on 01/07/24. As per last ID note from Dr. Overton, recommended 6 weeks of IV vancomycin  from 8/27 -EOT around 02/18/24. Will need reimaging at some point-closer to antibiotic EOT    Chronic hypotension: Continue midodrine .   ESRD on HD MWF: Nephrology following.   Normocytic anemia: Secondary to ESRD Hb stable. Continue Aranesp /iron  defer to nephrology service.   PAD-s/p bilateral BKA: Normally uses prosthesis. Telling some staff that he 'hasn't walked in years' . Renal navigator seeking collateral information from dialysis center. Due to severe pain-He has been refusing to mobilize with PT  Note-has been refusing physical therapy-multiple times-they have signed off.   HIV (CD4 254 on 5/29) Continue HAART.   Chronic HCV: Outpatient follow-up with infectious disease.   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 Continue moxifloxacin eyedrops twice daily OD, ophthalmic lubricant 4 times daily OD.   Chronic intrahepatic /extrahepatic biliary dilatation: Incidental finding Per radiology-similar to prior CTs.   Difficult disposition: Lives alone, has no friends, unclear if he has family - uninsured-SNF not a option per SW -history of ESRD with bilateral BKA -currently claims unable to ambulate with his prostheses, can't bring prostheses -hence unable to be discharged home safely.   Patient has been refusing PT/OT-multiple times-has resolved-PT/OT have  signed off. Normally-prior to this hospitalization was able to  ambulate with prosthesis and catch the bus/scat transport to go back and forth from dialysis center.    DM-2 (A1c 10.0 on 3/6) CBG stable. Continue SSI  Hypoglycemia - having intermittent episodes.  CBG 53 this AM at 0615 without a repeat until after dialysis which was 46.  Unclear if pt was treated for the earlier episode. --Hypoglycemia protocol    DVT prophylaxis: Heparin  sq Code Status: Full code Family Communication: No family at bed side. Disposition Plan:    Status is: Inpatient Remains inpatient appropriate because: Severity of illness. Patient is homeless, No Family, Bilateral BKA. Remains on IV antibiotics.   Consultants:  ID/nephrology/ophthalmology/interventional radiology.    Procedures:   8/27 >> IR aspiration 8/27 of of L4-L5 disc aspirate  Antimicrobials:  Anti-infectives (From admission, onward)    Start     Dose/Rate Route Frequency Ordered Stop   02/12/24 1800  vancomycin  (VANCOREADY) IVPB 500 mg/100 mL        500 mg 100 mL/hr over 60 Minutes Intravenous Every T-Th-Sa (Hemodialysis) 02/10/24 0958     01/26/24 1800  bictegravir-emtricitabine -tenofovir  AF (BIKTARVY ) 50-200-25 MG per tablet 1 tablet        1 tablet Oral Daily 01/26/24 0853     01/06/24 1800  ceFEPIme  (MAXIPIME ) 2 g in sodium chloride  0.9 % 100 mL IVPB  Status:  Discontinued        2 g 200 mL/hr over 30 Minutes Intravenous Every T-Th-Sa (1800) 01/05/24 1131 01/08/24 1053   01/06/24 1200  vancomycin  (VANCOREADY) IVPB 500 mg/100 mL  Status:  Discontinued        500 mg 100 mL/hr over 60 Minutes Intravenous Every T-Th-Sa (Hemodialysis) 01/05/24 1130 02/10/24 0958   01/03/24 1200  vancomycin  (VANCOREADY) IVPB 500 mg/100 mL        500 mg 100 mL/hr over 60 Minutes Intravenous  Once 01/02/24 1052 01/03/24 1414   01/03/24 1200  ceFEPIme  (MAXIPIME ) 2 g in sodium chloride  0.9 % 100 mL IVPB        2 g 200 mL/hr over 30 Minutes  Intravenous  Once 01/02/24 1212 01/03/24 1343   01/02/24 1200  ceFEPIme  (MAXIPIME ) 2 g in sodium chloride  0.9 % 100 mL IVPB  Status:  Discontinued        2 g 200 mL/hr over 30 Minutes Intravenous Every M-W-F (Hemodialysis) 12/31/23 1221 01/05/24 1131   01/02/24 1200  vancomycin  (VANCOREADY) IVPB 500 mg/100 mL  Status:  Discontinued        500 mg 100 mL/hr over 60 Minutes Intravenous Every M-W-F (Hemodialysis) 12/31/23 1221 01/05/24 1130   01/01/24 1200  vancomycin  (VANCOREADY) IVPB 500 mg/100 mL        500 mg 100 mL/hr over 60 Minutes Intravenous Every T-Th-Sa (Hemodialysis) 12/31/23 1219 01/01/24 1609   01/01/24 1200  ceFEPIme  (MAXIPIME ) 2 g in sodium chloride  0.9 % 100 mL IVPB        2 g 200 mL/hr over 30 Minutes Intravenous Once in dialysis 12/31/23 1219 01/01/24 1840   12/30/23 1400  vancomycin  (VANCOREADY) IVPB 500 mg/100 mL        500 mg 100 mL/hr over 60 Minutes Intravenous  Once 12/30/23 1310 12/30/23 2033   12/30/23 1400  ceFEPIme  (MAXIPIME ) 1 g in sodium chloride  0.9 % 100 mL IVPB        1 g 200 mL/hr over 30 Minutes Intravenous  Once 12/30/23 1310 12/30/23 2005   12/29/23 2230  ceFEPIme  (MAXIPIME ) 1 g in sodium chloride  0.9 % 100 mL IVPB  1 g 200 mL/hr over 30 Minutes Intravenous  Once 12/29/23 2131 12/30/23 0656   12/29/23 1200  vancomycin  (VANCOREADY) IVPB 500 mg/100 mL  Status:  Discontinued        500 mg 100 mL/hr over 60 Minutes Intravenous Every M-W-F (Hemodialysis) 12/25/23 1313 12/26/23 1056   12/29/23 1200  ceFEPIme  (MAXIPIME ) 2 g in sodium chloride  0.9 % 100 mL IVPB  Status:  Discontinued        2 g 200 mL/hr over 30 Minutes Intravenous Every M-W-F (Hemodialysis) 12/26/23 1056 12/31/23 1221   12/29/23 1200  vancomycin  (VANCOREADY) IVPB 500 mg/100 mL  Status:  Discontinued        500 mg 100 mL/hr over 60 Minutes Intravenous Every M-W-F (Hemodialysis) 12/26/23 1056 12/31/23 1221   12/27/23 1400  ceFEPIme  (MAXIPIME ) 2 g in sodium chloride  0.9 % 100 mL IVPB         2 g 200 mL/hr over 30 Minutes Intravenous  Once 12/27/23 1319 12/27/23 1443   12/27/23 1200  vancomycin  (VANCOREADY) IVPB 500 mg/100 mL  Status:  Discontinued        500 mg 100 mL/hr over 60 Minutes Intravenous Every Sat (Hemodialysis) 12/25/23 1313 12/26/23 1056   12/27/23 1200  ceFEPIme  (MAXIPIME ) 2 g in sodium chloride  0.9 % 100 mL IVPB  Status:  Discontinued        2 g 200 mL/hr over 30 Minutes Intravenous Every Sat (Hemodialysis) 12/26/23 1056 12/27/23 1318   12/27/23 1200  vancomycin  (VANCOREADY) IVPB 500 mg/100 mL        500 mg 100 mL/hr over 60 Minutes Intravenous Every Sat (Hemodialysis) 12/26/23 1056 12/27/23 1227   12/26/23 1700  ciprofloxacin  (CIPRO ) tablet 500 mg        500 mg Oral  Once 12/26/23 1056 12/26/23 1704   12/25/23 1915  ciprofloxacin  (CIPRO ) tablet 500 mg        500 mg Oral  Once 12/25/23 1819 12/25/23 1849   12/25/23 1800  ceFEPIme  (MAXIPIME ) 1 g in sodium chloride  0.9 % 100 mL IVPB  Status:  Discontinued        1 g 200 mL/hr over 30 Minutes Intravenous Every 24 hours 12/25/23 1316 12/26/23 1056   12/25/23 1200  ceFEPIme  (MAXIPIME ) 2 g in sodium chloride  0.9 % 100 mL IVPB  Status:  Discontinued        2 g 200 mL/hr over 30 Minutes Intravenous Once in dialysis 12/24/23 1739 12/25/23 1316   12/25/23 1200  vancomycin  (VANCOCIN ) IVPB 1000 mg/200 mL premix  Status:  Discontinued        1,000 mg 200 mL/hr over 60 Minutes Intravenous  Once 12/24/23 1739 12/24/23 1809   12/25/23 1200  vancomycin  (VANCOREADY) IVPB 500 mg/100 mL  Status:  Discontinued        500 mg 100 mL/hr over 60 Minutes Intravenous  Once 12/24/23 1809 12/25/23 1313   12/24/23 1200  ceFEPIme  (MAXIPIME ) 2 g in sodium chloride  0.9 % 100 mL IVPB  Status:  Discontinued        2 g 200 mL/hr over 30 Minutes Intravenous Every M-W-F (Hemodialysis) 12/22/23 0851 12/25/23 1316   12/19/23 0000  ceFEPime  (MAXIPIME ) IVPB        2 g Intravenous Every M-W-F (Hemodialysis) 12/19/23 1300 02/09/24 2359   12/19/23  0000  vancomycin  IVPB        500 mg Intravenous Every M-W-F (Hemodialysis) 12/19/23 1300 02/09/24 2359   12/16/23 2200  ceFEPIme  (MAXIPIME ) 1 g in sodium chloride  0.9 %  100 mL IVPB        1 g 200 mL/hr over 30 Minutes Intravenous Every 24 hours 12/15/23 1348 12/22/23 2203   12/15/23 2230  vancomycin  (VANCOREADY) IVPB 500 mg/100 mL  Status:  Discontinued        500 mg 100 mL/hr over 60 Minutes Intravenous Every M-W-F (Hemodialysis) 12/15/23 2217 12/25/23 1313   12/15/23 1400  bictegravir-emtricitabine -tenofovir  AF (BIKTARVY ) 50-200-25 MG per tablet 1 tablet  Status:  Discontinued        1 tablet Oral Daily 12/15/23 1307 01/26/24 0853   12/15/23 1347  vancomycin  variable dose per unstable renal function (pharmacist dosing)  Status:  Discontinued         Does not apply See admin instructions 12/15/23 1348 12/15/23 2218   12/15/23 1100  ceFEPIme  (MAXIPIME ) 2 g in sodium chloride  0.9 % 100 mL IVPB        2 g 200 mL/hr over 30 Minutes Intravenous  Once 12/15/23 1048 12/15/23 1130   12/15/23 1100  vancomycin  (VANCOCIN ) IVPB 1000 mg/200 mL premix        1,000 mg 200 mL/hr over 60 Minutes Intravenous  Once 12/15/23 1048 12/15/23 1154       Subjective: Patient had dialysis this AM.  Seen after return to room.  No acute complaints or events reported.  Pt had hypoglycemia this AM, recurrent post-dialysis today, treated and improved.  This has been an intermittent ongoing issue.   Objective: Vitals:   02/12/24 1113 02/12/24 1114 02/12/24 1205 02/12/24 1219  BP: 115/76 115/73 95/75 103/70  Pulse: 80 78 82 83  Resp: 17 20 18 17   Temp:  97.8 F (36.6 C) 98.1 F (36.7 C) 98 F (36.7 C)  TempSrc:    Oral  SpO2: 100% 100% 95% 96%  Weight:  41.7 kg    Height:        Intake/Output Summary (Last 24 hours) at 02/12/2024 1438 Last data filed at 02/12/2024 1356 Gross per 24 hour  Intake 240 ml  Output 0 ml  Net 240 ml   Filed Weights   02/07/24 1217 02/12/24 0748 02/12/24 1114  Weight: 42 kg  41.6 kg 41.7 kg    Examination:  General exam: awake, alert, no acute distress, chronically ill appearing HEENT: left eye cataract noted Respiratory system: CTAB, normal respiratory effort. Cardiovascular system: normal S1/S2, RRR Gastrointestinal system: soft, NT, ND Central nervous system: no gross focal neurologic deficits Extremities: b/l BKA's   Data Reviewed: I have personally reviewed following labs and imaging studies  CBC: Recent Labs  Lab 02/07/24 0607 02/10/24 1525 02/12/24 0417  WBC 5.0 5.2 8.1  HGB 10.3* 10.2* 10.2*  HCT 32.1* 31.8* 31.7*  MCV 99.4 100.3* 99.4  PLT 158 133* 122*   Basic Metabolic Panel: Recent Labs  Lab 02/07/24 0607 02/10/24 1525 02/11/24 0506 02/12/24 0417  NA 132* 130*  --  129*  K 4.0 4.5  --  4.5  CL 94* 93*  --  96*  CO2 25 23  --  23  GLUCOSE 105* 91  --  62*  BUN 24* 45*  --  38*  CREATININE 3.05* 4.00*  --  3.45*  CALCIUM  9.4 9.6  --  9.5  MG 2.2  --  1.9  --   PHOS 3.0 4.1 2.6 3.1   GFR: Estimated Creatinine Clearance: 14.6 mL/min (A) (by C-G formula based on SCr of 3.45 mg/dL (H)). Liver Function Tests: Recent Labs  Lab 02/10/24 1525 02/12/24 0417  ALBUMIN  2.5*  2.3*    No results for input(s): LIPASE, AMYLASE in the last 168 hours. No results for input(s): AMMONIA in the last 168 hours. Coagulation Profile: No results for input(s): INR, PROTIME in the last 168 hours. Cardiac Enzymes: No results for input(s): CKTOTAL, CKMB, CKMBINDEX, TROPONINI in the last 168 hours. BNP (last 3 results) No results for input(s): PROBNP in the last 8760 hours. HbA1C: No results for input(s): HGBA1C in the last 72 hours. CBG: Recent Labs  Lab 02/11/24 1646 02/11/24 2112 02/12/24 0615 02/12/24 1248 02/12/24 1343  GLUCAP 70 74 53* 46* 143*   Lipid Profile: No results for input(s): CHOL, HDL, LDLCALC, TRIG, CHOLHDL, LDLDIRECT in the last 72 hours. Thyroid Function Tests: No results for  input(s): TSH, T4TOTAL, FREET4, T3FREE, THYROIDAB in the last 72 hours. Anemia Panel: No results for input(s): VITAMINB12, FOLATE, FERRITIN, TIBC, IRON , RETICCTPCT in the last 72 hours. Sepsis Labs: No results for input(s): PROCALCITON, LATICACIDVEN in the last 168 hours.  No results found for this or any previous visit (from the past 240 hours).   Radiology Studies: No results found.  Scheduled Meds:  (feeding supplement) PROSource Plus  30 mL Oral Daily   acetaminophen   1,000 mg Oral TID   artificial tears   Right Eye QID   bictegravir-emtricitabine -tenofovir  AF  1 tablet Oral Daily   Chlorhexidine  Gluconate Cloth  6 each Topical Q0600   darbepoetin (ARANESP ) injection - DIALYSIS  150 mcg Subcutaneous Q Fri-1800   feeding supplement (NEPRO CARB STEADY)  237 mL Oral BID AC & HS   fentaNYL   1 patch Transdermal Q72H   gabapentin   200 mg Oral TID   gatifloxacin   1 drop Right Eye BID   heparin   5,000 Units Subcutaneous Q8H   lidocaine   2 patch Transdermal Q24H   methocarbamol   500 mg Oral TID   midodrine   10 mg Oral TID WC   midodrine   10 mg Oral Q T,Th,Sa-HD   multivitamin  1 tablet Oral QHS   pantoprazole   40 mg Oral BID AC   polyethylene glycol  17 g Oral Daily   sevelamer  carbonate  800 mg Oral TID WC   Continuous Infusions:  vancomycin        LOS: 59 days    Time spent: 35 mins    Burnard DELENA Cunning, DO [facility]   If 7PM-7AM, please contact night-coverage

## 2024-02-12 NOTE — Progress Notes (Signed)
 [facility] Progress Note   Subjective:    Seen in [facility]. On dialysis No complaints this morning   Objective Vitals:   02/12/24 0300 02/12/24 0748 02/12/24 0754 02/12/24 0801  BP: 98/67 (!) 94/52 99/63 99/63   Pulse: 70 80 76 75  Resp: 15 11 19 18   Temp: (!) 97.5 F (36.4 C) 97.8 F (36.6 C)    TempSrc: Oral     SpO2: 100% 100% 100% 100%  Weight:  41.6 kg    Height:       Physical Exam General: Chronically ill, frail man, NAD Heart: RRR Lungs: CTAB Abdomen: soft Extremities: no LE edema, B BKA Dialysis Access: [facility]  Additional Objective Labs: Basic Metabolic Panel: Recent Labs  Lab 02/07/24 0607 02/10/24 1525 02/11/24 0506 02/12/24 0417  NA 132* 130*  --  129*  K 4.0 4.5  --  4.5  CL 94* 93*  --  96*  CO2 25 23  --  23  GLUCOSE 105* 91  --  62*  BUN 24* 45*  --  38*  CREATININE 3.05* 4.00*  --  3.45*  CALCIUM  9.4 9.6  --  9.5  PHOS 3.0 4.1 2.6 3.1   CBC: Recent Labs  Lab 02/07/24 0607 02/10/24 1525 02/12/24 0417  WBC 5.0 5.2 8.1  HGB 10.3* 10.2* 10.2*  HCT 32.1* 31.8* 31.7*  MCV 99.4 100.3* 99.4  PLT 158 133* 122*   Medications:  anticoagulant sodium citrate      vancomycin       (feeding supplement) PROSource Plus  30 mL Oral Daily   acetaminophen   1,000 mg Oral TID   artificial tears   Right Eye QID   bictegravir-emtricitabine -tenofovir  AF  1 tablet Oral Daily   Chlorhexidine  Gluconate Cloth  6 each Topical Q0600   darbepoetin (ARANESP ) injection - DIALYSIS  150 mcg Subcutaneous Q Fri-1800   feeding supplement (NEPRO CARB STEADY)  237 mL Oral BID AC & HS   fentaNYL   1 patch Transdermal Q72H   gabapentin   200 mg Oral TID   gatifloxacin   1 drop Right Eye BID   heparin   5,000 Units Subcutaneous Q8H   lidocaine   2 patch Transdermal Q24H   methocarbamol   500 mg Oral TID   midodrine   10 mg Oral TID WC   midodrine   10 mg Oral Q T,Th,Sa-HD   multivitamin  1 tablet Oral QHS   pantoprazole   40 mg Oral BID AC   polyethylene glycol  17  g Oral Daily   sevelamer  carbonate  800 mg Oral TID WC    Dialysis Orders [facility] - MWF 3.5h  B400  45.5kg  TDC  Heparin  2000 Mircera 100 mcg - last dose 12/01/23 Calcitriol  1.0 mcg (on hold due to high Ca) Uses midodrine  10mg  TID + extra pre-HD    Assessment/Plan: L4-5 discitis/osteomyelitis/psoas abscess: Vanc/Cefepime  through 8/28, now Vanc 500mg  q HD alone until 02/18/24 per ID. S/p IR aspiration 8/27 - growing MRSA. Not a surgical candidate. Pain management per primary, unclear if pain will improve. Plan is repeat imaging in the near future. ESRD: Historically MWF schedule - on TTS this admit, insistent to stay on that schedule. HD 10/2.  BP/volume: Chronic hypotension on mido 10mg  TID and extra pre-HD. Below EDW now.  Anemia of ESRD: Hgb 10.3 - continue Aranesp  150mcg q Friday. No IV iron  d/t infection. HIV: controlled on Biktarvy  BMD: CorrCa high, VDRA on hold. Phos on goal, on sevelamer  as binder.  Nutrition: Alb low, continue supps and diet has  been liberalized. Debility: PT/OT as tolerated. Dispo: Very difficult placement. Noted patient is uninsured thus SNF is not an option.  No family or safe dispo plan at this time.  Maisie Ronnald Acosta PA-C [facility] 02/12/2024,9:10 AM

## 2024-02-13 LAB — GLUCOSE, CAPILLARY
Glucose-Capillary: 100 mg/dL — ABNORMAL HIGH (ref 70–99)
Glucose-Capillary: 73 mg/dL (ref 70–99)
Glucose-Capillary: 74 mg/dL (ref 70–99)
Glucose-Capillary: 75 mg/dL (ref 70–99)
Glucose-Capillary: 78 mg/dL (ref 70–99)
Glucose-Capillary: 81 mg/dL (ref 70–99)
Glucose-Capillary: 93 mg/dL (ref 70–99)

## 2024-02-13 MED ORDER — GABAPENTIN 100 MG PO CAPS
100.0000 mg | ORAL_CAPSULE | Freq: Three times a day (TID) | ORAL | Status: DC
  Administered 2024-02-13 – 2024-02-21 (×24): 100 mg via ORAL
  Filled 2024-02-13 (×24): qty 1

## 2024-02-13 NOTE — Plan of Care (Signed)

## 2024-02-13 NOTE — Progress Notes (Signed)
 [facility] Progress Note   Subjective:    Completed dialysis yesterday- no issues Seen in room. Video interpreter used. He has a lot of questions and concerns today about the course of his treatment. He's asking if he is going to get better.   Objective Vitals:   02/12/24 2028 02/13/24 0300 02/13/24 0511 02/13/24 0834  BP: 131/76 118/74  107/72  Pulse: 74 62  65  Resp: 16 15  15   Temp: 98.4 F (36.9 C) 98.2 F (36.8 C)  97.8 F (36.6 C)  TempSrc: Oral   Oral  SpO2: 100% (!) 81% 100% 100%  Weight:      Height:       Physical Exam General: Chronically ill, frail man, NAD Heart: RRR Lungs: CTAB Abdomen: soft Extremities: no LE edema, B BKA Dialysis Access: [facility]  Additional Objective Labs: Basic Metabolic Panel: Recent Labs  Lab 02/07/24 0607 02/10/24 1525 02/11/24 0506 02/12/24 0417  NA 132* 130*  --  129*  K 4.0 4.5  --  4.5  CL 94* 93*  --  96*  CO2 25 23  --  23  GLUCOSE 105* 91  --  62*  BUN 24* 45*  --  38*  CREATININE 3.05* 4.00*  --  3.45*  CALCIUM  9.4 9.6  --  9.5  PHOS 3.0 4.1 2.6 3.1   CBC: Recent Labs  Lab 02/07/24 0607 02/10/24 1525 02/12/24 0417  WBC 5.0 5.2 8.1  HGB 10.3* 10.2* 10.2*  HCT 32.1* 31.8* 31.7*  MCV 99.4 100.3* 99.4  PLT 158 133* 122*   Medications:  vancomycin  500 mg (02/12/24 1837)    (feeding supplement) PROSource Plus  30 mL Oral Daily   acetaminophen   1,000 mg Oral TID   artificial tears   Right Eye QID   bictegravir-emtricitabine -tenofovir  AF  1 tablet Oral Daily   Chlorhexidine  Gluconate Cloth  6 each Topical Q0600   darbepoetin (ARANESP ) injection - DIALYSIS  150 mcg Subcutaneous Q Fri-1800   feeding supplement (NEPRO CARB STEADY)  237 mL Oral BID AC & HS   fentaNYL   1 patch Transdermal Q72H   gabapentin   100 mg Oral TID   gatifloxacin   1 drop Right Eye BID   heparin   5,000 Units Subcutaneous Q8H   lidocaine   2 patch Transdermal Q24H   methocarbamol   500 mg Oral TID   midodrine   10 mg Oral TID  WC   midodrine   10 mg Oral Q T,Th,Sa-HD   multivitamin  1 tablet Oral QHS   pantoprazole   40 mg Oral BID AC   polyethylene glycol  17 g Oral Daily   sevelamer  carbonate  800 mg Oral TID WC    Dialysis Orders [facility] - MWF 3.5h  B400  45.5kg  TDC  Heparin  2000 Mircera 100 mcg - last dose 12/01/23 Calcitriol  1.0 mcg (on hold due to high Ca) Uses midodrine  10mg  TID + extra pre-HD    Assessment/Plan: L4-5 discitis/osteomyelitis/psoas abscess: Vanc/Cefepime  through 8/28, now Vanc 500mg  q HD alone until 02/18/24 per ID. S/p IR aspiration 8/27 - growing MRSA. Not a surgical candidate. Pain management per primary, unclear if pain will improve. Plan is repeat imaging in the near future. ESRD: Historically MWF schedule - on TTS this admit, insistent to stay on that schedule. HD 10/4.  BP/volume: Chronic hypotension on mido 10mg  TID and extra pre-HD. Below EDW now.  Anemia of ESRD: Hgb 10.3 - continue Aranesp  150mcg q Friday. No IV iron  d/t infection. HIV: controlled on Biktarvy   BMD: CorrCa high, VDRA on hold. Phos on goal, on sevelamer  as binder.  Nutrition: Alb low, continue supps and diet has been liberalized. Debility: PT/OT as tolerated. Dispo: Very difficult placement. Noted patient is uninsured thus SNF is not an option.  No family or safe dispo plan at this time.  Maisie Ronnald Acosta PA-C [facility] 02/13/2024,1:22 PM

## 2024-02-13 NOTE — Progress Notes (Addendum)
 PROGRESS NOTE    Alvin Daniels  FMW:[id number] DOB: 10-13-1970 DOA: 12/14/2023 PCP: Pcp, No   Brief Narrative:  This 53 yrs old male with PMH significant for PAD s/p bilateral BKA, ESRD on HD, HIV, chronic HCV-recent hospitalization from 5/28-6/6 for MRSA bacteremia with left fourth finger osteomyelitis (s/p amputation 5/29)-presented to the hospital with right hip pain. Patient was found to have L4-L5 osteomyelitis with psoas abscess.  Patient was admitted for further evaluation and orthopedics is consulted.   Significant studies: 6/4>> TEE: EF 60-65%, 1.8 x 1.2 mass right atrial wall- vegetation from old HD catheter impinged on RA wall-versus mass/tumor. 8/4>> MRI pelvis: Discitis/osteomyelitis L4-5-right psoas abscess. 8/5>> CT abdomen/pelvis: Discitis/osteomyelitis L4-5 with paraspinal inflammatory changes/right psoas abscess.  8/8>> MRI LS spine: L4-5 discitis/osteomyelitis with associated paraspinal inflammatory changes, right psoas abscess. 8/20>> MRI left hip: Discitis of the lower lumbar spine with extension into the psoas muscle. 8/20>> MRI left knee: BKA-no significant abnormality. 8/25>> MRI LS spine: Persistent/ongoing changes of osteomyelitis at L4/L5 with associated epidural phlegmon/abscess within the ventral epidural space at L4/L5.  Associated multiloculated bilateral psoas abscess measuring up to 3.1 cm on the left.   Significant microbiology data: 5/28>> blood culture: MRSA 5/29>> left finger abscess: E. coli/Streptococcus anginosus, Bacteroides fragilis. 8/4>> blood culture: No growth 8/27 >> L4-L5 disc aspirate culture: MRSA.      Patient remains on IV antibiotics through at least 02/18/24 per ID.    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 coli on bone culture) As per prior notes,  Patient is not a surgical candidate. IR was able to aspirate from L4-L5 >positive for MRSA on 01/07/24. As per last ID note from Dr. Overton, recommended 6 weeks of IV vancomycin  from 8/27 -EOT around 02/18/24. Will need reimaging at some point-closer to antibiotic EOT  Pain control PRN   Chronic hypotension: Continue midodrine .   ESRD on HD MWF: Nephrology following.   Normocytic anemia: Secondary to ESRD Hb stable. Continue Aranesp /iron  defer to nephrology service.   PAD-s/p bilateral BKA: Normally uses prosthesis. Telling some staff that he 'hasn't walked in years' . Renal navigator seeking collateral information from dialysis center. Due to severe pain-He has been refusing to mobilize with PT  Note-has been refusing physical therapy-multiple times-they have signed off.   HIV (CD4 254 on 5/29) Continue HAART.   Chronic HCV: Outpatient follow-up with infectious disease.   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 Continue moxifloxacin eyedrops twice daily OD, ophthalmic lubricant 4 times daily OD.   Chronic intrahepatic /extrahepatic biliary dilatation: Incidental finding Per radiology-similar to prior CTs.   Difficult disposition: Lives alone, has no friends, unclear if he has family - uninsured-SNF not a option per SW -history of ESRD with bilateral BKA -currently claims unable to ambulate with his prostheses, can't bring prostheses -hence unable to be  discharged home safely.  Patient has been refusing PT/OT-multiple times-has resolved-PT/OT have signed off. Normally-prior to this hospitalization was able to ambulate with prosthesis and catch the bus/scat transport to go back and forth from dialysis center.    DM-2 (A1c 10.0 on 3/6) CBG stable. Continue SSI  Hypoglycemia - having intermittent episodes.  CBG 53 this AM at 0615 without a repeat until after dialysis which was 46.  Unclear if pt was treated for the earlier episode. --Hypoglycemia protocol 10/3 - no episodes since yesterday afternoon   DVT prophylaxis: Heparin  sq Code Status: Full code Family Communication: No family at bed side. Disposition Plan:    Status is: Inpatient Remains inpatient appropriate because: Severity of illness. Patient is homeless, No Family, Bilateral BKA. Remains on IV antibiotics.   Consultants:  ID/nephrology/ophthalmology/interventional radiology.    Procedures:   8/27 >> IR aspiration 8/27 of of L4-L5 disc aspirate  Antimicrobials:  Anti-infectives (From admission, onward)    Start     Dose/Rate Route Frequency Ordered Stop   02/12/24 1800  vancomycin  (VANCOREADY) IVPB 500 mg/100 mL        500 mg 100 mL/hr over 60 Minutes Intravenous Every T-Th-Sa (Hemodialysis) 02/10/24 0958     01/26/24 1800  bictegravir-emtricitabine -tenofovir  AF (BIKTARVY ) 50-200-25 MG per tablet 1 tablet        1 tablet Oral Daily 01/26/24 0853     01/06/24 1800  ceFEPIme  (MAXIPIME ) 2 g in sodium chloride  0.9 % 100 mL IVPB  Status:  Discontinued        2 g 200 mL/hr over 30 Minutes Intravenous Every T-Th-Sa (1800) 01/05/24 1131 01/08/24 1053   01/06/24 1200  vancomycin  (VANCOREADY) IVPB 500 mg/100 mL  Status:  Discontinued        500 mg 100 mL/hr over 60 Minutes Intravenous Every T-Th-Sa (Hemodialysis) 01/05/24 1130 02/10/24 0958   01/03/24 1200  vancomycin  (VANCOREADY) IVPB 500 mg/100 mL        500 mg 100 mL/hr over 60 Minutes Intravenous  Once 01/02/24 1052  01/03/24 1414   01/03/24 1200  ceFEPIme  (MAXIPIME ) 2 g in sodium chloride  0.9 % 100 mL IVPB        2 g 200 mL/hr over 30 Minutes Intravenous  Once 01/02/24 1212 01/03/24 1343   01/02/24 1200  ceFEPIme  (MAXIPIME ) 2 g in sodium chloride  0.9 % 100 mL IVPB  Status:  Discontinued        2 g 200 mL/hr over 30 Minutes Intravenous Every M-W-F (Hemodialysis) 12/31/23 1221 01/05/24 1131   01/02/24 1200  vancomycin  (VANCOREADY) IVPB 500 mg/100 mL  Status:  Discontinued        500 mg 100 mL/hr over 60 Minutes Intravenous Every M-W-F (Hemodialysis) 12/31/23 1221 01/05/24 1130   01/01/24 1200  vancomycin  (VANCOREADY) IVPB 500 mg/100 mL        500 mg 100 mL/hr over 60 Minutes Intravenous Every T-Th-Sa (Hemodialysis) 12/31/23 1219 01/01/24 1609   01/01/24 1200  ceFEPIme  (MAXIPIME ) 2 g in sodium chloride  0.9 % 100 mL IVPB        2 g 200 mL/hr over 30 Minutes Intravenous Once in dialysis 12/31/23 1219 01/01/24 1840   12/30/23 1400  vancomycin  (VANCOREADY) IVPB 500 mg/100 mL        500 mg 100 mL/hr over 60 Minutes Intravenous  Once 12/30/23 1310 12/30/23 2033   12/30/23 1400  ceFEPIme  (MAXIPIME ) 1 g in sodium chloride  0.9 % 100 mL IVPB        1 g 200 mL/hr over 30 Minutes Intravenous  Once 12/30/23 1310 12/30/23 2005   12/29/23 2230  ceFEPIme  (MAXIPIME ) 1 g in sodium chloride  0.9 % 100 mL IVPB        1 g 200 mL/hr over 30 Minutes Intravenous  Once 12/29/23 2131 12/30/23 0656   12/29/23 1200  vancomycin  (VANCOREADY) IVPB 500 mg/100 mL  Status:  Discontinued        500 mg 100 mL/hr over 60 Minutes Intravenous Every M-W-F (Hemodialysis) 12/25/23 1313 12/26/23 1056   12/29/23 1200  ceFEPIme  (MAXIPIME ) 2 g in sodium chloride  0.9 % 100 mL IVPB  Status:  Discontinued        2 g 200 mL/hr over 30 Minutes Intravenous Every M-W-F (Hemodialysis) 12/26/23 1056 12/31/23 1221   12/29/23 1200  vancomycin  (VANCOREADY) IVPB 500 mg/100 mL  Status:  Discontinued        500 mg 100 mL/hr over 60 Minutes Intravenous Every  M-W-F (Hemodialysis) 12/26/23 1056 12/31/23 1221   12/27/23 1400  ceFEPIme  (MAXIPIME ) 2 g in sodium chloride  0.9 % 100 mL IVPB        2 g 200 mL/hr over 30 Minutes Intravenous  Once 12/27/23 1319 12/27/23 1443   12/27/23 1200  vancomycin  (VANCOREADY) IVPB 500 mg/100 mL  Status:  Discontinued        500 mg 100 mL/hr over 60 Minutes Intravenous Every Sat (Hemodialysis) 12/25/23 1313 12/26/23 1056   12/27/23 1200  ceFEPIme  (MAXIPIME ) 2 g in sodium chloride  0.9 % 100 mL IVPB  Status:  Discontinued        2 g 200 mL/hr over 30 Minutes Intravenous Every Sat (Hemodialysis) 12/26/23 1056 12/27/23 1318   12/27/23 1200  vancomycin  (VANCOREADY) IVPB 500 mg/100 mL        500 mg 100 mL/hr over 60 Minutes Intravenous Every Sat (Hemodialysis) 12/26/23 1056 12/27/23 1227   12/26/23 1700  ciprofloxacin  (CIPRO ) tablet 500 mg        500 mg Oral  Once 12/26/23 1056 12/26/23 1704   12/25/23 1915  ciprofloxacin  (CIPRO ) tablet 500 mg        500 mg Oral  Once 12/25/23 1819 12/25/23 1849   12/25/23 1800  ceFEPIme  (MAXIPIME ) 1 g in sodium chloride  0.9 % 100 mL IVPB  Status:  Discontinued        1 g 200 mL/hr over 30 Minutes Intravenous Every 24 hours 12/25/23 1316 12/26/23 1056   12/25/23 1200  ceFEPIme  (MAXIPIME ) 2 g in sodium chloride  0.9 % 100 mL IVPB  Status:  Discontinued        2 g 200 mL/hr over 30 Minutes Intravenous Once in dialysis 12/24/23 1739 12/25/23 1316   12/25/23 1200  vancomycin  (VANCOCIN ) IVPB 1000 mg/200 mL premix  Status:  Discontinued        1,000 mg 200 mL/hr over 60 Minutes Intravenous  Once 12/24/23 1739 12/24/23 1809   12/25/23 1200  vancomycin  (VANCOREADY) IVPB 500 mg/100 mL  Status:  Discontinued        500 mg 100 mL/hr over 60 Minutes Intravenous  Once 12/24/23 1809 12/25/23 1313   12/24/23 1200  ceFEPIme  (MAXIPIME ) 2 g in sodium chloride  0.9 % 100 mL IVPB  Status:  Discontinued        2 g 200 mL/hr over 30 Minutes Intravenous Every M-W-F (Hemodialysis) 12/22/23 0851 12/25/23 1316    12/19/23 0000  ceFEPime  (MAXIPIME ) IVPB        2 g Intravenous Every M-W-F (Hemodialysis) 12/19/23 1300 02/09/24 2359   12/19/23 0000  vancomycin  IVPB  500 mg Intravenous Every M-W-F (Hemodialysis) 12/19/23 1300 02/09/24 2359   12/16/23 2200  ceFEPIme  (MAXIPIME ) 1 g in sodium chloride  0.9 % 100 mL IVPB        1 g 200 mL/hr over 30 Minutes Intravenous Every 24 hours 12/15/23 1348 12/22/23 2203   12/15/23 2230  vancomycin  (VANCOREADY) IVPB 500 mg/100 mL  Status:  Discontinued        500 mg 100 mL/hr over 60 Minutes Intravenous Every M-W-F (Hemodialysis) 12/15/23 2217 12/25/23 1313   12/15/23 1400  bictegravir-emtricitabine -tenofovir  AF (BIKTARVY ) 50-200-25 MG per tablet 1 tablet  Status:  Discontinued        1 tablet Oral Daily 12/15/23 1307 01/26/24 0853   12/15/23 1347  vancomycin  variable dose per unstable renal function (pharmacist dosing)  Status:  Discontinued         Does not apply See admin instructions 12/15/23 1348 12/15/23 2218   12/15/23 1100  ceFEPIme  (MAXIPIME ) 2 g in sodium chloride  0.9 % 100 mL IVPB        2 g 200 mL/hr over 30 Minutes Intravenous  Once 12/15/23 1048 12/15/23 1130   12/15/23 1100  vancomycin  (VANCOCIN ) IVPB 1000 mg/200 mL premix        1,000 mg 200 mL/hr over 60 Minutes Intravenous  Once 12/15/23 1048 12/15/23 1154       Subjective: Patient awake resting in bed this AM, RN giving morning meds.  Pt denies complaints besides pain.  RN reports pt seems to be having paranoia - stating he heard the doctor say the nurses are trying to kill him, and he repeatedly asks if his infection is better.  RN was in room for my entire encounter, reassured patient this physician had just told him he is getting better, but he does not seem to believe her.  Otherwise no acute events, no hypoglycemic episodes since yesterday afternoon.   Objective: Vitals:   02/12/24 2028 02/13/24 0300 02/13/24 0511 02/13/24 0834  BP: 131/76 118/74  107/72  Pulse: 74 62  65  Resp:  16 15  15   Temp: 98.4 F (36.9 C) 98.2 F (36.8 C)  97.8 F (36.6 C)  TempSrc: Oral   Oral  SpO2: 100% (!) 81% 100% 100%  Weight:      Height:        Intake/Output Summary (Last 24 hours) at 02/13/2024 1236 Last data filed at 02/12/2024 1845 Gross per 24 hour  Intake 252.43 ml  Output --  Net 252.43 ml   Filed Weights   02/07/24 1217 02/12/24 0748 02/12/24 1114  Weight: 42 kg 41.6 kg 41.7 kg    Examination:  General exam: awake, alert, no acute distress, chronically ill appearing HEENT: left eye cataract noted Respiratory system: CTAB, normal respiratory effort. Cardiovascular system: normal S1/S2, RRR Gastrointestinal system: soft, NT, ND Central nervous system: no gross focal neurologic deficits Extremities: b/l BKA's   Data Reviewed: I have personally reviewed following labs and imaging studies  CBC: Recent Labs  Lab 02/07/24 0607 02/10/24 1525 02/12/24 0417  WBC 5.0 5.2 8.1  HGB 10.3* 10.2* 10.2*  HCT 32.1* 31.8* 31.7*  MCV 99.4 100.3* 99.4  PLT 158 133* 122*   Basic Metabolic Panel: Recent Labs  Lab 02/07/24 0607 02/10/24 1525 02/11/24 0506 02/12/24 0417  NA 132* 130*  --  129*  K 4.0 4.5  --  4.5  CL 94* 93*  --  96*  CO2 25 23  --  23  GLUCOSE 105* 91  --  62*  BUN 24* 45*  --  38*  CREATININE 3.05* 4.00*  --  3.45*  CALCIUM  9.4 9.6  --  9.5  MG 2.2  --  1.9  --   PHOS 3.0 4.1 2.6 3.1   GFR: Estimated Creatinine Clearance: 14.6 mL/min (A) (by C-G formula based on SCr of 3.45 mg/dL (H)). Liver Function Tests: Recent Labs  Lab 02/10/24 1525 02/12/24 0417  ALBUMIN  2.5* 2.3*    No results for input(s): LIPASE, AMYLASE in the last 168 hours. No results for input(s): AMMONIA in the last 168 hours. Coagulation Profile: No results for input(s): INR, PROTIME in the last 168 hours. Cardiac Enzymes: No results for input(s): CKTOTAL, CKMB, CKMBINDEX, TROPONINI in the last 168 hours. BNP (last 3 results) No results for  input(s): PROBNP in the last 8760 hours. HbA1C: No results for input(s): HGBA1C in the last 72 hours. CBG: Recent Labs  Lab 02/12/24 2134 02/13/24 0053 02/13/24 0404 02/13/24 0618 02/13/24 0839  GLUCAP 60* 73 100* 93 78   Lipid Profile: No results for input(s): CHOL, HDL, LDLCALC, TRIG, CHOLHDL, LDLDIRECT in the last 72 hours. Thyroid Function Tests: No results for input(s): TSH, T4TOTAL, FREET4, T3FREE, THYROIDAB in the last 72 hours. Anemia Panel: No results for input(s): VITAMINB12, FOLATE, FERRITIN, TIBC, IRON , RETICCTPCT in the last 72 hours. Sepsis Labs: No results for input(s): PROCALCITON, LATICACIDVEN in the last 168 hours.  No results found for this or any previous visit (from the past 240 hours).   Radiology Studies: No results found.  Scheduled Meds:  (feeding supplement) PROSource Plus  30 mL Oral Daily   acetaminophen   1,000 mg Oral TID   artificial tears   Right Eye QID   bictegravir-emtricitabine -tenofovir  AF  1 tablet Oral Daily   Chlorhexidine  Gluconate Cloth  6 each Topical Q0600   darbepoetin (ARANESP ) injection - DIALYSIS  150 mcg Subcutaneous Q Fri-1800   feeding supplement (NEPRO CARB STEADY)  237 mL Oral BID AC & HS   fentaNYL   1 patch Transdermal Q72H   gabapentin   100 mg Oral TID   gatifloxacin   1 drop Right Eye BID   heparin   5,000 Units Subcutaneous Q8H   lidocaine   2 patch Transdermal Q24H   methocarbamol   500 mg Oral TID   midodrine   10 mg Oral TID WC   midodrine   10 mg Oral Q T,Th,Sa-HD   multivitamin  1 tablet Oral QHS   pantoprazole   40 mg Oral BID AC   polyethylene glycol  17 g Oral Daily   sevelamer  carbonate  800 mg Oral TID WC   Continuous Infusions:  vancomycin  500 mg (02/12/24 1837)     LOS: 60 days    Time spent: 35 mins    Burnard DELENA Cunning, DO [facility]   If 7PM-7AM, please contact night-coverage

## 2024-02-14 LAB — RENAL FUNCTION PANEL
Albumin: 2.4 g/dL — ABNORMAL LOW (ref 3.5–5.0)
Anion gap: 11 (ref 5–15)
BUN: 38 mg/dL — ABNORMAL HIGH (ref 6–20)
CO2: 24 mmol/L (ref 22–32)
Calcium: 9.2 mg/dL (ref 8.9–10.3)
Chloride: 93 mmol/L — ABNORMAL LOW (ref 98–111)
Creatinine, Ser: 3.11 mg/dL — ABNORMAL HIGH (ref 0.61–1.24)
GFR, Estimated: 23 mL/min — ABNORMAL LOW (ref >60)
Glucose, Bld: 88 mg/dL (ref 70–99)
Phosphorus: 3.7 mg/dL (ref 2.5–4.6)
Potassium: 4.7 mmol/L (ref 3.5–5.1)
Sodium: 128 mmol/L — ABNORMAL LOW (ref 135–145)

## 2024-02-14 LAB — CBC WITH DIFFERENTIAL/PLATELET
Abs Immature Granulocytes: 0.04 K/uL (ref 0.00–0.07)
Basophils Absolute: 0 K/uL (ref 0.0–0.1)
Basophils Relative: 0 %
Eosinophils Absolute: 0.1 K/uL (ref 0.0–0.5)
Eosinophils Relative: 1 %
HCT: 31.7 % — ABNORMAL LOW (ref 39.0–52.0)
Hemoglobin: 10 g/dL — ABNORMAL LOW (ref 13.0–17.0)
Immature Granulocytes: 1 %
Lymphocytes Relative: 15 %
Lymphs Abs: 1 K/uL (ref 0.7–4.0)
MCH: 31.3 pg (ref 26.0–34.0)
MCHC: 31.5 g/dL (ref 30.0–36.0)
MCV: 99.4 fL (ref 80.0–100.0)
Monocytes Absolute: 0.4 K/uL (ref 0.1–1.0)
Monocytes Relative: 5 %
Neutro Abs: 5.3 K/uL (ref 1.7–7.7)
Neutrophils Relative %: 78 %
Platelets: 108 K/uL — ABNORMAL LOW (ref 150–400)
RBC: 3.19 MIL/uL — ABNORMAL LOW (ref 4.22–5.81)
RDW: 20.2 % — ABNORMAL HIGH (ref 11.5–15.5)
WBC: 6.8 K/uL (ref 4.0–10.5)
nRBC: 0 % (ref 0.0–0.2)

## 2024-02-14 LAB — HEPATITIS PANEL, ACUTE
HCV Ab: REACTIVE — AB
Hep A IgM: NONREACTIVE
Hep B C IgM: NONREACTIVE
Hepatitis B Surface Ag: NONREACTIVE

## 2024-02-14 LAB — GLUCOSE, CAPILLARY
Glucose-Capillary: 88 mg/dL (ref 70–99)
Glucose-Capillary: 116 mg/dL — ABNORMAL HIGH (ref 70–99)
Glucose-Capillary: 166 mg/dL — ABNORMAL HIGH (ref 70–99)
Glucose-Capillary: 54 mg/dL — ABNORMAL LOW (ref 70–99)
Glucose-Capillary: 99 mg/dL (ref 70–99)
Glucose-Capillary: 95 mg/dL (ref 70–99)

## 2024-02-14 MED ORDER — ALTEPLASE 2 MG IJ SOLR: 2.0000 mg | Freq: Once | INTRAMUSCULAR | Status: DC | PRN

## 2024-02-14 MED ORDER — MIDODRINE HCL 5 MG PO TABS
ORAL_TABLET | ORAL | Status: AC
  Filled 2024-02-14: qty 2

## 2024-02-14 MED ORDER — HEPARIN SODIUM (PORCINE) 1000 UNIT/ML IJ SOLN
3200.0000 [IU] | Freq: Once | INTRAMUSCULAR | Status: AC
  Administered 2024-02-17: 3200 [IU]

## 2024-02-14 MED ORDER — DEXTROSE 50 % IV SOLN
INTRAVENOUS | Status: AC
  Filled 2024-02-14: qty 50

## 2024-02-14 MED ORDER — HEPARIN SODIUM (PORCINE) 1000 UNIT/ML DIALYSIS
1000.0000 [IU] | INTRAMUSCULAR | Status: DC | PRN
  Administered 2024-02-14: 1000 [IU]

## 2024-02-14 MED ORDER — HEPARIN SODIUM (PORCINE) 1000 UNIT/ML IJ SOLN
INTRAMUSCULAR | Status: AC
  Filled 2024-02-14: qty 4

## 2024-02-14 NOTE — Progress Notes (Signed)
 Hypoglycemic Event  CBG:54  Treatment: D50 50 ml  Symptoms: none  Follow-up CBG: Time 1310  CBG Result:166  Possible Reasons for Event:Pt had HD tx.& per pt no meals has been served/oofered.  Comments/MD notified:Attending MD made aware.    Alvin Daniels, Alvin Daniels

## 2024-02-14 NOTE — Progress Notes (Signed)
 PROGRESS NOTE    Alvin Daniels  FMW:[id number] DOB: 01-11-1971 DOA: 12/14/2023 PCP: Pcp, No   Brief Narrative:  This 53 yrs old male with PMH significant for PAD s/p bilateral BKA, ESRD on HD, HIV, chronic HCV-recent hospitalization from 5/28-6/6 for MRSA bacteremia with left fourth finger osteomyelitis (s/p amputation 5/29)-presented to the hospital with right hip pain. Patient was found to have L4-L5 osteomyelitis with psoas abscess.  Patient was admitted for further evaluation and orthopedics is consulted.   Significant studies: 6/4>> TEE: EF 60-65%, 1.8 x 1.2 mass right atrial wall- vegetation from old HD catheter impinged on RA wall-versus mass/tumor. 8/4>> MRI pelvis: Discitis/osteomyelitis L4-5-right psoas abscess. 8/5>> CT abdomen/pelvis: Discitis/osteomyelitis L4-5 with paraspinal inflammatory changes/right psoas abscess.  8/8>> MRI LS spine: L4-5 discitis/osteomyelitis with associated paraspinal inflammatory changes, right psoas abscess. 8/20>> MRI left hip: Discitis of the lower lumbar spine with extension into the psoas muscle. 8/20>> MRI left knee: BKA-no significant abnormality. 8/25>> MRI LS spine: Persistent/ongoing changes of osteomyelitis at L4/L5 with associated epidural phlegmon/abscess within the ventral epidural space at L4/L5.  Associated multiloculated bilateral psoas abscess measuring up to 3.1 cm on the left.   Significant microbiology data: 5/28>> blood culture: MRSA 5/29>> left finger abscess: E. coli/Streptococcus anginosus, Bacteroides fragilis. 8/4>> blood culture: No growth 8/27 >> L4-L5 disc aspirate culture: MRSA.      Patient remains on IV antibiotics through at least 02/18/24 per ID.    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 coli on bone culture) As per prior notes,  Patient is not a surgical candidate. IR was able to aspirate from L4-L5 >positive for MRSA on 01/07/24. As per last ID note from Dr. Overton, recommended 6 weeks of IV vancomycin  from 8/27 -EOT around 02/18/24. Will need reimaging at some point-closer to antibiotic EOT  Pain control PRN   Chronic hypotension:  BP's labile ?accuracy.  Low of 87/65 this AM Continue midodrine . Maintain MAP > 65   ESRD on HD MWF: Nephrology following.   Normocytic anemia: Secondary to ESRD Hb stable. Continue Aranesp /iron  defer to nephrology service.   PAD-s/p bilateral BKA: Normally uses prosthesis. Telling some staff that he 'hasn't walked in years' . Renal navigator seeking collateral information from dialysis center. Due to severe pain-He has been refusing to mobilize with PT  Note-has been refusing physical therapy-multiple times-they have signed off.   HIV (CD4 254 on 5/29) Continue HAART.   Chronic HCV: Outpatient follow-up with infectious disease.   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 Continue moxifloxacin eyedrops twice daily OD, ophthalmic lubricant 4 times daily OD.   Chronic intrahepatic /extrahepatic biliary dilatation: Incidental finding Per radiology-similar to prior CTs.   Difficult disposition: Lives alone, has no friends, unclear if he has family - uninsured-SNF not a option per SW -history of ESRD with bilateral BKA -currently claims unable to ambulate with  his prostheses, can't bring prostheses -hence unable to be discharged home safely.   Patient has been refusing PT/OT-multiple times-has resolved-PT/OT have signed off. Normally-prior to this hospitalization was able to ambulate with prosthesis and catch the bus/scat transport to go back and forth from dialysis center.    DM-2 (A1c 10.0 on 3/6) CBG stable. Continue SSI  Hypoglycemia - having intermittent episodes.  CBG 53 this AM at 0615 without a repeat until after dialysis which was 46.  Unclear if pt was treated for the earlier episode. --Hypoglycemia protocol 10/3 - no episodes since yesterday afternoon   DVT prophylaxis: Heparin  sq Code Status: Full code Family Communication: No family at bed side. Disposition Plan:    Status is: Inpatient Remains inpatient appropriate because: Severity of illness. Patient is homeless, No Family, Bilateral BKA. Remains on IV antibiotics.   Consultants:  ID/nephrology/ophthalmology/interventional radiology.    Procedures:   8/27 >> IR aspiration 8/27 of of L4-L5 disc aspirate  Antimicrobials:  Anti-infectives (From admission, onward)    Start     Dose/Rate Route Frequency Ordered Stop   02/12/24 1800  vancomycin  (VANCOREADY) IVPB 500 mg/100 mL        500 mg 100 mL/hr over 60 Minutes Intravenous Every T-Th-Sa (Hemodialysis) 02/10/24 0958     01/26/24 1800  bictegravir-emtricitabine -tenofovir  AF (BIKTARVY ) 50-200-25 MG per tablet 1 tablet        1 tablet Oral Daily 01/26/24 0853     01/06/24 1800  ceFEPIme  (MAXIPIME ) 2 g in sodium chloride  0.9 % 100 mL IVPB  Status:  Discontinued        2 g 200 mL/hr over 30 Minutes Intravenous Every T-Th-Sa (1800) 01/05/24 1131 01/08/24 1053   01/06/24 1200  vancomycin  (VANCOREADY) IVPB 500 mg/100 mL  Status:  Discontinued        500 mg 100 mL/hr over 60 Minutes Intravenous Every T-Th-Sa (Hemodialysis) 01/05/24 1130 02/10/24 0958   01/03/24 1200  vancomycin  (VANCOREADY) IVPB 500 mg/100 mL        500  mg 100 mL/hr over 60 Minutes Intravenous  Once 01/02/24 1052 01/03/24 1414   01/03/24 1200  ceFEPIme  (MAXIPIME ) 2 g in sodium chloride  0.9 % 100 mL IVPB        2 g 200 mL/hr over 30 Minutes Intravenous  Once 01/02/24 1212 01/03/24 1343   01/02/24 1200  ceFEPIme  (MAXIPIME ) 2 g in sodium chloride  0.9 % 100 mL IVPB  Status:  Discontinued        2 g 200 mL/hr over 30 Minutes Intravenous Every M-W-F (Hemodialysis) 12/31/23 1221 01/05/24 1131   01/02/24 1200  vancomycin  (VANCOREADY) IVPB 500 mg/100 mL  Status:  Discontinued        500 mg 100 mL/hr over 60 Minutes Intravenous Every M-W-F (Hemodialysis) 12/31/23 1221 01/05/24 1130   01/01/24 1200  vancomycin  (VANCOREADY) IVPB 500 mg/100 mL        500 mg 100 mL/hr over 60 Minutes Intravenous Every T-Th-Sa (Hemodialysis) 12/31/23 1219 01/01/24 1609   01/01/24 1200  ceFEPIme  (MAXIPIME ) 2 g in sodium chloride  0.9 % 100 mL IVPB        2 g 200 mL/hr over 30 Minutes Intravenous Once in dialysis 12/31/23 1219 01/01/24 1840   12/30/23 1400  vancomycin  (VANCOREADY) IVPB 500 mg/100 mL        500 mg 100 mL/hr over 60 Minutes Intravenous  Once 12/30/23 1310 12/30/23 2033   12/30/23 1400  ceFEPIme  (MAXIPIME ) 1 g in sodium chloride  0.9 % 100 mL IVPB  1 g 200 mL/hr over 30 Minutes Intravenous  Once 12/30/23 1310 12/30/23 2005   12/29/23 2230  ceFEPIme  (MAXIPIME ) 1 g in sodium chloride  0.9 % 100 mL IVPB        1 g 200 mL/hr over 30 Minutes Intravenous  Once 12/29/23 2131 12/30/23 0656   12/29/23 1200  vancomycin  (VANCOREADY) IVPB 500 mg/100 mL  Status:  Discontinued        500 mg 100 mL/hr over 60 Minutes Intravenous Every M-W-F (Hemodialysis) 12/25/23 1313 12/26/23 1056   12/29/23 1200  ceFEPIme  (MAXIPIME ) 2 g in sodium chloride  0.9 % 100 mL IVPB  Status:  Discontinued        2 g 200 mL/hr over 30 Minutes Intravenous Every M-W-F (Hemodialysis) 12/26/23 1056 12/31/23 1221   12/29/23 1200  vancomycin  (VANCOREADY) IVPB 500 mg/100 mL  Status:  Discontinued         500 mg 100 mL/hr over 60 Minutes Intravenous Every M-W-F (Hemodialysis) 12/26/23 1056 12/31/23 1221   12/27/23 1400  ceFEPIme  (MAXIPIME ) 2 g in sodium chloride  0.9 % 100 mL IVPB        2 g 200 mL/hr over 30 Minutes Intravenous  Once 12/27/23 1319 12/27/23 1443   12/27/23 1200  vancomycin  (VANCOREADY) IVPB 500 mg/100 mL  Status:  Discontinued        500 mg 100 mL/hr over 60 Minutes Intravenous Every Sat (Hemodialysis) 12/25/23 1313 12/26/23 1056   12/27/23 1200  ceFEPIme  (MAXIPIME ) 2 g in sodium chloride  0.9 % 100 mL IVPB  Status:  Discontinued        2 g 200 mL/hr over 30 Minutes Intravenous Every Sat (Hemodialysis) 12/26/23 1056 12/27/23 1318   12/27/23 1200  vancomycin  (VANCOREADY) IVPB 500 mg/100 mL        500 mg 100 mL/hr over 60 Minutes Intravenous Every Sat (Hemodialysis) 12/26/23 1056 12/27/23 1227   12/26/23 1700  ciprofloxacin  (CIPRO ) tablet 500 mg        500 mg Oral  Once 12/26/23 1056 12/26/23 1704   12/25/23 1915  ciprofloxacin  (CIPRO ) tablet 500 mg        500 mg Oral  Once 12/25/23 1819 12/25/23 1849   12/25/23 1800  ceFEPIme  (MAXIPIME ) 1 g in sodium chloride  0.9 % 100 mL IVPB  Status:  Discontinued        1 g 200 mL/hr over 30 Minutes Intravenous Every 24 hours 12/25/23 1316 12/26/23 1056   12/25/23 1200  ceFEPIme  (MAXIPIME ) 2 g in sodium chloride  0.9 % 100 mL IVPB  Status:  Discontinued        2 g 200 mL/hr over 30 Minutes Intravenous Once in dialysis 12/24/23 1739 12/25/23 1316   12/25/23 1200  vancomycin  (VANCOCIN ) IVPB 1000 mg/200 mL premix  Status:  Discontinued        1,000 mg 200 mL/hr over 60 Minutes Intravenous  Once 12/24/23 1739 12/24/23 1809   12/25/23 1200  vancomycin  (VANCOREADY) IVPB 500 mg/100 mL  Status:  Discontinued        500 mg 100 mL/hr over 60 Minutes Intravenous  Once 12/24/23 1809 12/25/23 1313   12/24/23 1200  ceFEPIme  (MAXIPIME ) 2 g in sodium chloride  0.9 % 100 mL IVPB  Status:  Discontinued        2 g 200 mL/hr over 30 Minutes  Intravenous Every M-W-F (Hemodialysis) 12/22/23 0851 12/25/23 1316   12/19/23 0000  ceFEPime  (MAXIPIME ) IVPB        2 g Intravenous Every M-W-F (Hemodialysis) 12/19/23 1300 02/09/24  2359   12/19/23 0000  vancomycin  IVPB        500 mg Intravenous Every M-W-F (Hemodialysis) 12/19/23 1300 02/09/24 2359   12/16/23 2200  ceFEPIme  (MAXIPIME ) 1 g in sodium chloride  0.9 % 100 mL IVPB        1 g 200 mL/hr over 30 Minutes Intravenous Every 24 hours 12/15/23 1348 12/22/23 2203   12/15/23 2230  vancomycin  (VANCOREADY) IVPB 500 mg/100 mL  Status:  Discontinued        500 mg 100 mL/hr over 60 Minutes Intravenous Every M-W-F (Hemodialysis) 12/15/23 2217 12/25/23 1313   12/15/23 1400  bictegravir-emtricitabine -tenofovir  AF (BIKTARVY ) 50-200-25 MG per tablet 1 tablet  Status:  Discontinued        1 tablet Oral Daily 12/15/23 1307 01/26/24 0853   12/15/23 1347  vancomycin  variable dose per unstable renal function (pharmacist dosing)  Status:  Discontinued         Does not apply See admin instructions 12/15/23 1348 12/15/23 2218   12/15/23 1100  ceFEPIme  (MAXIPIME ) 2 g in sodium chloride  0.9 % 100 mL IVPB        2 g 200 mL/hr over 30 Minutes Intravenous  Once 12/15/23 1048 12/15/23 1130   12/15/23 1100  vancomycin  (VANCOCIN ) IVPB 1000 mg/200 mL premix        1,000 mg 200 mL/hr over 60 Minutes Intravenous  Once 12/15/23 1048 12/15/23 1154       Subjective: Patient being taken to dialysis this AM.  He reports ongoing pain in his leg/hip. No other acute events or complaints reported.   Objective: Vitals:   02/14/24 1030 02/14/24 1100 02/14/24 1110 02/14/24 1115  BP: (!) 86/69 93/68 102/72 (!) 248/76  Pulse: 73 68 72 72  Resp: (!) 9 (!) 7 10 10   Temp:    97.7 F (36.5 C)  TempSrc:      SpO2: 100% 100% 100% 100%  Weight:    43.8 kg  Height:        Intake/Output Summary (Last 24 hours) at 02/14/2024 1128 Last data filed at 02/14/2024 1115 Gross per 24 hour  Intake --  Output -100 ml  Net 100 ml    Filed Weights   02/14/24 0820 02/14/24 0825 02/14/24 1115  Weight: 4.8 kg 43.8 kg 43.8 kg    Examination:  General exam: no acute distress, chronically ill appearing HEENT: left eye cataract noted Respiratory system: on room air, normal respiratory effort. Cardiovascular system: RRR, no peripheral edema Gastrointestinal system: soft, NT, ND Central nervous system: no gross focal neurologic deficits Extremities: b/l BKA's   Data Reviewed: I have personally reviewed following labs and imaging studies  CBC: Recent Labs  Lab 02/10/24 1525 02/12/24 0417 02/14/24 0846  WBC 5.2 8.1 6.8  NEUTROABS  --   --  5.3  HGB 10.2* 10.2* 10.0*  HCT 31.8* 31.7* 31.7*  MCV 100.3* 99.4 99.4  PLT 133* 122* 108*   Basic Metabolic Panel: Recent Labs  Lab 02/10/24 1525 02/11/24 0506 02/12/24 0417 02/14/24 0847  NA 130*  --  129* 128*  K 4.5  --  4.5 4.7  CL 93*  --  96* 93*  CO2 23  --  23 24  GLUCOSE 91  --  62* 88  BUN 45*  --  38* 38*  CREATININE 4.00*  --  3.45* 3.11*  CALCIUM  9.6  --  9.5 9.2  MG  --  1.9  --   --   PHOS 4.1 2.6 3.1 3.7  GFR: Estimated Creatinine Clearance: 17 mL/min (A) (by C-G formula based on SCr of 3.11 mg/dL (H)). Liver Function Tests: Recent Labs  Lab 02/10/24 1525 02/12/24 0417 02/14/24 0847  ALBUMIN  2.5* 2.3* 2.4*    No results for input(s): LIPASE, AMYLASE in the last 168 hours. No results for input(s): AMMONIA in the last 168 hours. Coagulation Profile: No results for input(s): INR, PROTIME in the last 168 hours. Cardiac Enzymes: No results for input(s): CKTOTAL, CKMB, CKMBINDEX, TROPONINI in the last 168 hours. BNP (last 3 results) No results for input(s): PROBNP in the last 8760 hours. HbA1C: No results for input(s): HGBA1C in the last 72 hours. CBG: Recent Labs  Lab 02/13/24 1335 02/13/24 1621 02/13/24 2111 02/14/24 0458 02/14/24 0915  GLUCAP 75 81 74 88 116*   Lipid Profile: No results for input(s):  CHOL, HDL, LDLCALC, TRIG, CHOLHDL, LDLDIRECT in the last 72 hours. Thyroid Function Tests: No results for input(s): TSH, T4TOTAL, FREET4, T3FREE, THYROIDAB in the last 72 hours. Anemia Panel: No results for input(s): VITAMINB12, FOLATE, FERRITIN, TIBC, IRON , RETICCTPCT in the last 72 hours. Sepsis Labs: No results for input(s): PROCALCITON, LATICACIDVEN in the last 168 hours.  No results found for this or any previous visit (from the past 240 hours).   Radiology Studies: No results found.  Scheduled Meds:  (feeding supplement) PROSource Plus  30 mL Oral Daily   acetaminophen   1,000 mg Oral TID   artificial tears   Right Eye QID   bictegravir-emtricitabine -tenofovir  AF  1 tablet Oral Daily   Chlorhexidine  Gluconate Cloth  6 each Topical Q0600   darbepoetin (ARANESP ) injection - DIALYSIS  150 mcg Subcutaneous Q Fri-1800   feeding supplement (NEPRO CARB STEADY)  237 mL Oral BID AC & HS   fentaNYL   1 patch Transdermal Q72H   gabapentin   100 mg Oral TID   gatifloxacin   1 drop Right Eye BID   heparin   5,000 Units Subcutaneous Q8H   lidocaine   2 patch Transdermal Q24H   methocarbamol   500 mg Oral TID   midodrine   10 mg Oral TID WC   midodrine   10 mg Oral Q T,Th,Sa-HD   multivitamin  1 tablet Oral QHS   pantoprazole   40 mg Oral BID AC   polyethylene glycol  17 g Oral Daily   sevelamer  carbonate  800 mg Oral TID WC   Continuous Infusions:  vancomycin  500 mg (02/12/24 1837)     LOS: 61 days    Time spent: 35 mins    Burnard DELENA Cunning, DO [facility]   If 7PM-7AM, please contact night-coverage

## 2024-02-14 NOTE — Progress Notes (Signed)
   02/14/24 1115  Vitals  Temp 97.7 F (36.5 C)  Pulse Rate 72  Resp 10  BP 126/76  SpO2 100 %  O2 Device Room Air  Weight 43.8 kg  Oxygen Therapy  Patient Activity (if Appropriate) In bed  Pulse Oximetry Type Continuous  Oximetry Probe Site Changed No  Post Treatment  Dialyzer Clearance Lightly streaked  Liters Processed 64.4  Fluid Removed (mL) -100 mL  Tolerated HD Treatment Yes

## 2024-02-14 NOTE — Progress Notes (Signed)
 0700- Patient left unit for OR. Dentures (upper and lower) placed in pink container - patient's ring also placed in a pink contained and labelled. Left on table in room 12.

## 2024-02-14 NOTE — Plan of Care (Signed)
   Problem: Education: Goal: Knowledge of General Education information will improve Description: Including pain rating scale, medication(s)/side effects and non-pharmacologic comfort measures Outcome: Progressing   Problem: Activity: Goal: Risk for activity intolerance will decrease Outcome: Progressing

## 2024-02-14 NOTE — Progress Notes (Signed)
   02/14/24 1136  Pain Assessment  Pain Scale 0-10  Pain Score 8  Pain Location Back  Pain Intervention(s) Repositioned (Patient to receive PO medication with food on unit)  Hemodialysis Catheter Right Internal jugular Double lumen Permanent (Tunneled)  Placement Date/Time: 11/11/23 1428   Serial / Lot #: [id number]  Expiration Date: 04/14/28  Time Out: Correct patient;Correct site;Correct procedure  Maximum sterile barrier precautions: Hand hygiene;Cap;Mask;Sterile gown;Sterile gloves;Large sterile ...  Site Condition No complications  Blue Lumen Status Flushed;Heparin  locked  Red Lumen Status Flushed;Heparin  locked  Catheter fill solution Heparin  1000 units/ml  Catheter fill volume (Arterial) 1.6 cc  Catheter fill volume (Venous) 1.6  Dressing Type Transparent  Dressing Status Antimicrobial disc/dressing in place;Clean, Dry, Intact  Post treatment catheter status Capped and Clamped  Fistula / Graft Left Upper arm Arteriovenous fistula  No Placement Date or Time found.   Orientation: Left  Access Location: Upper arm  Access Type: Arteriovenous fistula  Site Condition Other (Comment) (Nonfunctioning; not used)  Fistula / Graft Assessment Absent;Bruit;Thrill  Status  (Negative Bruit/thrill)  Neurological  Level of Consciousness Alert  Orientation Level Oriented X4  Respiratory  Respiratory Pattern Regular;Unlabored  Chest Assessment Chest expansion symmetrical  Bilateral Breath Sounds Clear  Cardiac  Pulse Regular  Heart Sounds S1, S2  Jugular Venous Distention (JVD) No  Antiarrhythmic device No  Vascular  R Radial Pulse +2  L Radial Pulse +2  Psychosocial  Psychosocial (WDL) WDL  Patient Behaviors Appropriate for age;Appropriate for situation;Cooperative;Calm  Needs Expressed Physical (Bowel movement; need for change; partial bath)   Received patient in bed to unit.  Alert and oriented.  Informed consent signed and in chart.   TX duration:3 hours  Patient tolerated  well.  Transported back to the room  Alert, without acute distress.  Hand-off given to patient's nurse.   Access used: R IJ Access issues: None  Total UF removed: 0 ml Medication(s) given: See MAR Post HD weight: unable to weigh Post HD VS: see flowsheet   Charmaine HERO Myndi [facility]

## 2024-02-14 NOTE — Progress Notes (Signed)
 [facility] Progress Note   Subjective:   Seen and examined at bedside with video interpreter.  Feeling well.  Denies CP, SOB, abdominal pain, and n/v/d.  No specific complaints.  Reports tolerating dialysis well.   Objective Vitals:   02/14/24 0825 02/14/24 0858 02/14/24 0900 02/14/24 0930  BP: 108/76 114/78 114/78 (!) 87/65  Pulse: 66 70 69 68  Resp: 16 (!) 9 11 (!) 8  Temp:      TempSrc:      SpO2: 100% 100% 100% 100%  Weight:      Height:       Physical Exam General:chronically ill, frail male in NAD Heart:RRR Lungs:CTAB, nml WOB on RA Abdomen:soft, NTND Extremities:no LE edema, b/l BKA Dialysis Access: LU AVF+b/t   Filed Weights   02/12/24 0748 02/12/24 1114 02/14/24 0820  Weight: 41.6 kg 41.7 kg 4.8 kg   No intake or output data in the 24 hours ending 02/14/24 0935  Additional Objective Labs: Basic Metabolic Panel: Recent Labs  Lab 02/10/24 1525 02/11/24 0506 02/12/24 0417 02/14/24 0847  NA 130*  --  129* 128*  K 4.5  --  4.5 4.7  CL 93*  --  96* 93*  CO2 23  --  23 24  GLUCOSE 91  --  62* 88  BUN 45*  --  38* 38*  CREATININE 4.00*  --  3.45* 3.11*  CALCIUM  9.6  --  9.5 9.2  PHOS 4.1 2.6 3.1 3.7   Liver Function Tests: Recent Labs  Lab 02/10/24 1525 02/12/24 0417 02/14/24 0847  ALBUMIN  2.5* 2.3* 2.4*   No results for input(s): LIPASE, AMYLASE in the last 168 hours. CBC: Recent Labs  Lab 02/10/24 1525 02/12/24 0417 02/14/24 0846  WBC 5.2 8.1 6.8  NEUTROABS  --   --  5.3  HGB 10.2* 10.2* 10.0*  HCT 31.8* 31.7* 31.7*  MCV 100.3* 99.4 99.4  PLT 133* 122* 108*   Blood Culture    Component Value Date/Time   SDES NEEDLE ASPIRATE 01/07/2024 1612   SPECREQUEST DISC 01/07/2024 1612   CULT  01/07/2024 1612    FEW METHICILLIN RESISTANT STAPHYLOCOCCUS AUREUS NO ANAEROBES ISOLATED Performed at [facility], [address] [place], [place] [address]    REPTSTATUS 01/12/2024 FINAL 01/07/2024 1612    CBG: Recent  Labs  Lab 02/13/24 1335 02/13/24 1621 02/13/24 2111 02/14/24 0458 02/14/24 0915  GLUCAP 75 81 74 88 116*    Medications:  vancomycin  500 mg (02/12/24 1837)    (feeding supplement) PROSource Plus  30 mL Oral Daily   acetaminophen   1,000 mg Oral TID   artificial tears   Right Eye QID   bictegravir-emtricitabine -tenofovir  AF  1 tablet Oral Daily   Chlorhexidine  Gluconate Cloth  6 each Topical Q0600   darbepoetin (ARANESP ) injection - DIALYSIS  150 mcg Subcutaneous Q Fri-1800   feeding supplement (NEPRO CARB STEADY)  237 mL Oral BID AC & HS   fentaNYL   1 patch Transdermal Q72H   gabapentin   100 mg Oral TID   gatifloxacin   1 drop Right Eye BID   heparin   5,000 Units Subcutaneous Q8H   lidocaine   2 patch Transdermal Q24H   methocarbamol   500 mg Oral TID   midodrine   10 mg Oral TID WC   midodrine   10 mg Oral Q T,Th,Sa-HD   multivitamin  1 tablet Oral QHS   pantoprazole   40 mg Oral BID AC   polyethylene glycol  17 g Oral Daily   sevelamer   carbonate  800 mg Oral TID WC    Dialysis Orders: [facility] - MWF 3.5h  B400  45.5kg  TDC  Heparin  2000 Mircera 100 mcg - last dose 12/01/23 Calcitriol  1.0 mcg (on hold due to high Ca) Uses midodrine  10mg  TID + extra pre-HD    Assessment/Plan: L4-5 discitis/osteomyelitis/psoas abscess: Vanc/Cefepime  through 8/28, now Vanc 500mg  q HD alone until 02/18/24 per ID. S/p IR aspiration 8/27 - growing MRSA. Not a surgical candidate. Pain management per primary, unclear if pain will improve. Plan is repeat imaging in the near future. ESRD: Historically MWF schedule - on TTS this admit, insistent to stay on that schedule. HD 10/4.  BP/volume: Chronic hypotension on mido 10mg  TID and extra pre-HD. Below EDW now, will need to lower dry weight on d/c. Not eating much per nursing staff.  Anemia of ESRD: Hgb 10.0 - continue Aranesp  150mcg q Friday. No IV iron  d/t infection. HIV: controlled on Biktarvy  BMD: CorrCa high, VDRA on hold. Phos on goal, on sevelamer  as  binder.  Nutrition: Alb low, continue supps and diet has been liberalized.  Encourage nutrition.  Debility: PT/OT signed off due to ongoing refusal of therapy. Dispo: Very difficult placement. Noted patient is uninsured thus SNF is not an option.  No family or safe dispo plan at this time.  Manuelita Labella, PA-C [facility] 02/14/2024,9:35 AM  LOS: 61 days

## 2024-02-15 LAB — GLUCOSE, CAPILLARY
Glucose-Capillary: 191 mg/dL — ABNORMAL HIGH (ref 70–99)
Glucose-Capillary: 64 mg/dL — ABNORMAL LOW (ref 70–99)
Glucose-Capillary: 112 mg/dL — ABNORMAL HIGH (ref 70–99)
Glucose-Capillary: 104 mg/dL — ABNORMAL HIGH (ref 70–99)
Glucose-Capillary: 58 mg/dL — ABNORMAL LOW (ref 70–99)
Glucose-Capillary: 111 mg/dL — ABNORMAL HIGH (ref 70–99)

## 2024-02-15 NOTE — Progress Notes (Signed)
 PROGRESS NOTE    Alvin Daniels  FMW:[id number] DOB: 12-21-1970 DOA: 12/14/2023 PCP: Pcp, No   Brief Narrative:  This 53 yrs old male with PMH significant for PAD s/p bilateral BKA, ESRD on HD, HIV, chronic HCV-recent hospitalization from 5/28-6/6 for MRSA bacteremia with left fourth finger osteomyelitis (s/p amputation 5/29)-presented to the hospital with right hip pain. Patient was found to have L4-L5 osteomyelitis with psoas abscess.  Patient was admitted for further evaluation and orthopedics is consulted.   Significant studies: 6/4>> TEE: EF 60-65%, 1.8 x 1.2 mass right atrial wall- vegetation from old HD catheter impinged on RA wall-versus mass/tumor. 8/4>> MRI pelvis: Discitis/osteomyelitis L4-5-right psoas abscess. 8/5>> CT abdomen/pelvis: Discitis/osteomyelitis L4-5 with paraspinal inflammatory changes/right psoas abscess.  8/8>> MRI LS spine: L4-5 discitis/osteomyelitis with associated paraspinal inflammatory changes, right psoas abscess. 8/20>> MRI left hip: Discitis of the lower lumbar spine with extension into the psoas muscle. 8/20>> MRI left knee: BKA-no significant abnormality. 8/25>> MRI LS spine: Persistent/ongoing changes of osteomyelitis at L4/L5 with associated epidural phlegmon/abscess within the ventral epidural space at L4/L5.  Associated multiloculated bilateral psoas abscess measuring up to 3.1 cm on the left.   Significant microbiology data: 5/28>> blood culture: MRSA 5/29>> left finger abscess: E. coli/Streptococcus anginosus, Bacteroides fragilis. 8/4>> blood culture: No growth 8/27 >> L4-L5 disc aspirate culture: MRSA.      Patient remains on IV antibiotics through at least 02/18/24 per ID.    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 coli on bone culture) As per prior notes,  Patient is not a surgical candidate. IR was able to aspirate from L4-L5 >positive for MRSA on 01/07/24. As per last ID note from Dr. Overton, recommended 6 weeks of IV vancomycin  from 8/27 -EOT around 02/18/24. Will need reimaging at some point-closer to antibiotic EOT  Pain control PRN   Chronic hypotension:  BP's labile ?accuracy.  Low of 87/65 this AM Continue midodrine . Maintain MAP > 65   ESRD on HD MWF: Nephrology following.   Normocytic anemia: Secondary to ESRD Hb stable. Continue Aranesp /iron  defer to nephrology service.   PAD-s/p bilateral BKA: Normally uses prosthesis. Telling some staff that he 'hasn't walked in years' . Renal navigator seeking collateral information from dialysis center. Due to severe pain-He has been refusing to mobilize with PT  Note-has been refusing physical therapy-multiple times-they have signed off.   HIV (CD4 254 on 5/29) Continue HAART.   Chronic HCV: Outpatient follow-up with infectious disease.   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 Continue moxifloxacin eyedrops twice daily OD, ophthalmic lubricant 4 times daily OD.   Chronic intrahepatic /extrahepatic biliary dilatation: Incidental finding Per radiology-similar to prior CTs.   Difficult disposition: Lives alone, has no friends, unclear if he has family - uninsured-SNF not a option per SW -history of ESRD with bilateral BKA -currently claims unable to ambulate with  his prostheses, can't bring prostheses -hence unable to be discharged home safely.   Patient has been refusing PT/OT-multiple times-has resolved-PT/OT have signed off. Normally-prior to this hospitalization was able to ambulate with prosthesis and catch the bus/scat transport to go back and forth from dialysis center.    DM-2 (A1c 10.0 on 3/6) CBG stable. Continue SSI  Hypoglycemia - having intermittent episodes.  CBG 53 this AM at 0615 without a repeat until after dialysis which was 46.  Unclear if pt was treated for the earlier episode. --Hypoglycemia protocol 10/3 - no episodes since yesterday afternoon   DVT prophylaxis: Heparin  sq Code Status: Full code Family Communication: No family at bed side. Disposition Plan:    Status is: Inpatient Remains inpatient appropriate because: Severity of illness. Patient is homeless, No Family, Bilateral BKA. Remains on IV antibiotics.   Consultants:  ID/nephrology/ophthalmology/interventional radiology.    Procedures:   8/27 >> IR aspiration 8/27 of of L4-L5 disc aspirate  Antimicrobials:  Anti-infectives (From admission, onward)    Start     Dose/Rate Route Frequency Ordered Stop   02/12/24 1800  vancomycin  (VANCOREADY) IVPB 500 mg/100 mL        500 mg 100 mL/hr over 60 Minutes Intravenous Every T-Th-Sa (Hemodialysis) 02/10/24 0958     01/26/24 1800  bictegravir-emtricitabine -tenofovir  AF (BIKTARVY ) 50-200-25 MG per tablet 1 tablet        1 tablet Oral Daily 01/26/24 0853     01/06/24 1800  ceFEPIme  (MAXIPIME ) 2 g in sodium chloride  0.9 % 100 mL IVPB  Status:  Discontinued        2 g 200 mL/hr over 30 Minutes Intravenous Every T-Th-Sa (1800) 01/05/24 1131 01/08/24 1053   01/06/24 1200  vancomycin  (VANCOREADY) IVPB 500 mg/100 mL  Status:  Discontinued        500 mg 100 mL/hr over 60 Minutes Intravenous Every T-Th-Sa (Hemodialysis) 01/05/24 1130 02/10/24 0958   01/03/24 1200  vancomycin  (VANCOREADY) IVPB 500 mg/100 mL        500  mg 100 mL/hr over 60 Minutes Intravenous  Once 01/02/24 1052 01/03/24 1414   01/03/24 1200  ceFEPIme  (MAXIPIME ) 2 g in sodium chloride  0.9 % 100 mL IVPB        2 g 200 mL/hr over 30 Minutes Intravenous  Once 01/02/24 1212 01/03/24 1343   01/02/24 1200  ceFEPIme  (MAXIPIME ) 2 g in sodium chloride  0.9 % 100 mL IVPB  Status:  Discontinued        2 g 200 mL/hr over 30 Minutes Intravenous Every M-W-F (Hemodialysis) 12/31/23 1221 01/05/24 1131   01/02/24 1200  vancomycin  (VANCOREADY) IVPB 500 mg/100 mL  Status:  Discontinued        500 mg 100 mL/hr over 60 Minutes Intravenous Every M-W-F (Hemodialysis) 12/31/23 1221 01/05/24 1130   01/01/24 1200  vancomycin  (VANCOREADY) IVPB 500 mg/100 mL        500 mg 100 mL/hr over 60 Minutes Intravenous Every T-Th-Sa (Hemodialysis) 12/31/23 1219 01/01/24 1609   01/01/24 1200  ceFEPIme  (MAXIPIME ) 2 g in sodium chloride  0.9 % 100 mL IVPB        2 g 200 mL/hr over 30 Minutes Intravenous Once in dialysis 12/31/23 1219 01/01/24 1840   12/30/23 1400  vancomycin  (VANCOREADY) IVPB 500 mg/100 mL        500 mg 100 mL/hr over 60 Minutes Intravenous  Once 12/30/23 1310 12/30/23 2033   12/30/23 1400  ceFEPIme  (MAXIPIME ) 1 g in sodium chloride  0.9 % 100 mL IVPB  1 g 200 mL/hr over 30 Minutes Intravenous  Once 12/30/23 1310 12/30/23 2005   12/29/23 2230  ceFEPIme  (MAXIPIME ) 1 g in sodium chloride  0.9 % 100 mL IVPB        1 g 200 mL/hr over 30 Minutes Intravenous  Once 12/29/23 2131 12/30/23 0656   12/29/23 1200  vancomycin  (VANCOREADY) IVPB 500 mg/100 mL  Status:  Discontinued        500 mg 100 mL/hr over 60 Minutes Intravenous Every M-W-F (Hemodialysis) 12/25/23 1313 12/26/23 1056   12/29/23 1200  ceFEPIme  (MAXIPIME ) 2 g in sodium chloride  0.9 % 100 mL IVPB  Status:  Discontinued        2 g 200 mL/hr over 30 Minutes Intravenous Every M-W-F (Hemodialysis) 12/26/23 1056 12/31/23 1221   12/29/23 1200  vancomycin  (VANCOREADY) IVPB 500 mg/100 mL  Status:  Discontinued         500 mg 100 mL/hr over 60 Minutes Intravenous Every M-W-F (Hemodialysis) 12/26/23 1056 12/31/23 1221   12/27/23 1400  ceFEPIme  (MAXIPIME ) 2 g in sodium chloride  0.9 % 100 mL IVPB        2 g 200 mL/hr over 30 Minutes Intravenous  Once 12/27/23 1319 12/27/23 1443   12/27/23 1200  vancomycin  (VANCOREADY) IVPB 500 mg/100 mL  Status:  Discontinued        500 mg 100 mL/hr over 60 Minutes Intravenous Every Sat (Hemodialysis) 12/25/23 1313 12/26/23 1056   12/27/23 1200  ceFEPIme  (MAXIPIME ) 2 g in sodium chloride  0.9 % 100 mL IVPB  Status:  Discontinued        2 g 200 mL/hr over 30 Minutes Intravenous Every Sat (Hemodialysis) 12/26/23 1056 12/27/23 1318   12/27/23 1200  vancomycin  (VANCOREADY) IVPB 500 mg/100 mL        500 mg 100 mL/hr over 60 Minutes Intravenous Every Sat (Hemodialysis) 12/26/23 1056 12/27/23 1227   12/26/23 1700  ciprofloxacin  (CIPRO ) tablet 500 mg        500 mg Oral  Once 12/26/23 1056 12/26/23 1704   12/25/23 1915  ciprofloxacin  (CIPRO ) tablet 500 mg        500 mg Oral  Once 12/25/23 1819 12/25/23 1849   12/25/23 1800  ceFEPIme  (MAXIPIME ) 1 g in sodium chloride  0.9 % 100 mL IVPB  Status:  Discontinued        1 g 200 mL/hr over 30 Minutes Intravenous Every 24 hours 12/25/23 1316 12/26/23 1056   12/25/23 1200  ceFEPIme  (MAXIPIME ) 2 g in sodium chloride  0.9 % 100 mL IVPB  Status:  Discontinued        2 g 200 mL/hr over 30 Minutes Intravenous Once in dialysis 12/24/23 1739 12/25/23 1316   12/25/23 1200  vancomycin  (VANCOCIN ) IVPB 1000 mg/200 mL premix  Status:  Discontinued        1,000 mg 200 mL/hr over 60 Minutes Intravenous  Once 12/24/23 1739 12/24/23 1809   12/25/23 1200  vancomycin  (VANCOREADY) IVPB 500 mg/100 mL  Status:  Discontinued        500 mg 100 mL/hr over 60 Minutes Intravenous  Once 12/24/23 1809 12/25/23 1313   12/24/23 1200  ceFEPIme  (MAXIPIME ) 2 g in sodium chloride  0.9 % 100 mL IVPB  Status:  Discontinued        2 g 200 mL/hr over 30 Minutes  Intravenous Every M-W-F (Hemodialysis) 12/22/23 0851 12/25/23 1316   12/19/23 0000  ceFEPime  (MAXIPIME ) IVPB        2 g Intravenous Every M-W-F (Hemodialysis) 12/19/23 1300 02/09/24  2359   12/19/23 0000  vancomycin  IVPB        500 mg Intravenous Every M-W-F (Hemodialysis) 12/19/23 1300 02/09/24 2359   12/16/23 2200  ceFEPIme  (MAXIPIME ) 1 g in sodium chloride  0.9 % 100 mL IVPB        1 g 200 mL/hr over 30 Minutes Intravenous Every 24 hours 12/15/23 1348 12/22/23 2203   12/15/23 2230  vancomycin  (VANCOREADY) IVPB 500 mg/100 mL  Status:  Discontinued        500 mg 100 mL/hr over 60 Minutes Intravenous Every M-W-F (Hemodialysis) 12/15/23 2217 12/25/23 1313   12/15/23 1400  bictegravir-emtricitabine -tenofovir  AF (BIKTARVY ) 50-200-25 MG per tablet 1 tablet  Status:  Discontinued        1 tablet Oral Daily 12/15/23 1307 01/26/24 0853   12/15/23 1347  vancomycin  variable dose per unstable renal function (pharmacist dosing)  Status:  Discontinued         Does not apply See admin instructions 12/15/23 1348 12/15/23 2218   12/15/23 1100  ceFEPIme  (MAXIPIME ) 2 g in sodium chloride  0.9 % 100 mL IVPB        2 g 200 mL/hr over 30 Minutes Intravenous  Once 12/15/23 1048 12/15/23 1130   12/15/23 1100  vancomycin  (VANCOCIN ) IVPB 1000 mg/200 mL premix        1,000 mg 200 mL/hr over 60 Minutes Intravenous  Once 12/15/23 1048 12/15/23 1154       Subjective: Patient up in recliner this AM.  Besides leg/hip pain, he denies complaints.  Had hypoglycemic episode this AM, 64 that improved with treatment.  Nursing staff report his PO intake remains poor.    Objective: Vitals:   02/14/24 1517 02/14/24 2043 02/15/24 0630 02/15/24 0825  BP: 117/82 (!) 144/94 129/81 130/81  Pulse: 73 75 68 71  Resp: 18 17 17 17   Temp: 97.6 F (36.4 C) 98.4 F (36.9 C) 98.1 F (36.7 C) 98.2 F (36.8 C)  TempSrc:      SpO2: 99% 99% 100%   Weight:      Height:        Intake/Output Summary (Last 24 hours) at 02/15/2024  1221 Last data filed at 02/14/2024 1300 Gross per 24 hour  Intake 240 ml  Output --  Net 240 ml   Filed Weights   02/14/24 0820 02/14/24 0825 02/14/24 1115  Weight: 4.8 kg 43.8 kg 43.8 kg    Examination:  General exam: no acute distress, frail and chronically ill appearing HEENT: left eye cataract noted Respiratory system: on room air, normal respiratory effort. CTAB no wheezes or rhonchi Cardiovascular system: RRR, no peripheral edema Gastrointestinal system: soft, NT, ND Central nervous system: no gross focal neurologic deficits Extremities: b/l BKA's   Data Reviewed: I have personally reviewed following labs and imaging studies  CBC: Recent Labs  Lab 02/10/24 1525 02/12/24 0417 02/14/24 0846  WBC 5.2 8.1 6.8  NEUTROABS  --   --  5.3  HGB 10.2* 10.2* 10.0*  HCT 31.8* 31.7* 31.7*  MCV 100.3* 99.4 99.4  PLT 133* 122* 108*   Basic Metabolic Panel: Recent Labs  Lab 02/10/24 1525 02/11/24 0506 02/12/24 0417 02/14/24 0847  NA 130*  --  129* 128*  K 4.5  --  4.5 4.7  CL 93*  --  96* 93*  CO2 23  --  23 24  GLUCOSE 91  --  62* 88  BUN 45*  --  38* 38*  CREATININE 4.00*  --  3.45* 3.11*  CALCIUM  9.6  --  9.5 9.2  MG  --  1.9  --   --   PHOS 4.1 2.6 3.1 3.7   GFR: Estimated Creatinine Clearance: 17 mL/min (A) (by C-G formula based on SCr of 3.11 mg/dL (H)). Liver Function Tests: Recent Labs  Lab 02/10/24 1525 02/12/24 0417 02/14/24 0847  ALBUMIN  2.5* 2.3* 2.4*    No results for input(s): LIPASE, AMYLASE in the last 168 hours. No results for input(s): AMMONIA in the last 168 hours. Coagulation Profile: No results for input(s): INR, PROTIME in the last 168 hours. Cardiac Enzymes: No results for input(s): CKTOTAL, CKMB, CKMBINDEX, TROPONINI in the last 168 hours. BNP (last 3 results) No results for input(s): PROBNP in the last 8760 hours. HbA1C: No results for input(s): HGBA1C in the last 72 hours. CBG: Recent Labs  Lab  02/14/24 1310 02/14/24 1644 02/14/24 2144 02/15/24 0630 02/15/24 0708  GLUCAP 166* 99 95 64* 191*   Lipid Profile: No results for input(s): CHOL, HDL, LDLCALC, TRIG, CHOLHDL, LDLDIRECT in the last 72 hours. Thyroid Function Tests: No results for input(s): TSH, T4TOTAL, FREET4, T3FREE, THYROIDAB in the last 72 hours. Anemia Panel: No results for input(s): VITAMINB12, FOLATE, FERRITIN, TIBC, IRON , RETICCTPCT in the last 72 hours. Sepsis Labs: No results for input(s): PROCALCITON, LATICACIDVEN in the last 168 hours.  No results found for this or any previous visit (from the past 240 hours).   Radiology Studies: No results found.  Scheduled Meds:  (feeding supplement) PROSource Plus  30 mL Oral Daily   acetaminophen   1,000 mg Oral TID   artificial tears   Right Eye QID   bictegravir-emtricitabine -tenofovir  AF  1 tablet Oral Daily   Chlorhexidine  Gluconate Cloth  6 each Topical Q0600   darbepoetin (ARANESP ) injection - DIALYSIS  150 mcg Subcutaneous Q Fri-1800   feeding supplement (NEPRO CARB STEADY)  237 mL Oral BID AC & HS   fentaNYL   1 patch Transdermal Q72H   gabapentin   100 mg Oral TID   gatifloxacin   1 drop Right Eye BID   heparin   5,000 Units Subcutaneous Q8H   heparin  sodium (porcine)  3,200 Units Intracatheter Once   lidocaine   2 patch Transdermal Q24H   methocarbamol   500 mg Oral TID   midodrine   10 mg Oral TID WC   midodrine   10 mg Oral Q T,Th,Sa-HD   multivitamin  1 tablet Oral QHS   pantoprazole   40 mg Oral BID AC   polyethylene glycol  17 g Oral Daily   sevelamer  carbonate  800 mg Oral TID WC   Continuous Infusions:  vancomycin  500 mg (02/14/24 1805)     LOS: 62 days    Time spent: 35 mins    Burnard DELENA Cunning, DO [facility]   If 7PM-7AM, please contact night-coverage

## 2024-02-15 NOTE — Progress Notes (Signed)
 Hypoglycemic Event  CBG: 64  Treatment: D50 50 mL (25 gm)  Symptoms: None  Follow-up CBG: Time:0709 CBG Result:191  Possible Reasons for Event: Other: Poor po intake   Comments/MD notified:Dr. Fausto Truett JULIANNA Debby

## 2024-02-15 NOTE — Plan of Care (Signed)

## 2024-02-15 NOTE — Progress Notes (Signed)
 [facility] Progress Note   Subjective:   Patient seen and examined at bedside with video interpreter.  Complains of diarrhea.  No other specific complaints.  Denies chest pain, SOB and abdominal pain.   Objective Vitals:   02/14/24 1517 02/14/24 2043 02/15/24 0630 02/15/24 0825  BP: 117/82 (!) 144/94 129/81 130/81  Pulse: 73 75 68 71  Resp: 18 17 17 17   Temp: 97.6 F (36.4 C) 98.4 F (36.9 C) 98.1 F (36.7 C) 98.2 F (36.8 C)  TempSrc:      SpO2: 99% 99% 100%   Weight:      Height:       Physical Exam General:chronically ill appearing, frail, thin male in NAD Heart:RRR Lungs:nml WOB on RA Abdomen:soft, NTND Extremities:no LE edema, b/l BKA Dialysis Access: LU AVF +b/t   Filed Weights   02/14/24 0820 02/14/24 0825 02/14/24 1115  Weight: 4.8 kg 43.8 kg 43.8 kg    Intake/Output Summary (Last 24 hours) at 02/15/2024 1120 Last data filed at 02/14/2024 1300 Gross per 24 hour  Intake 240 ml  Output --  Net 240 ml    Additional Objective Labs: Basic Metabolic Panel: Recent Labs  Lab 02/10/24 1525 02/11/24 0506 02/12/24 0417 02/14/24 0847  NA 130*  --  129* 128*  K 4.5  --  4.5 4.7  CL 93*  --  96* 93*  CO2 23  --  23 24  GLUCOSE 91  --  62* 88  BUN 45*  --  38* 38*  CREATININE 4.00*  --  3.45* 3.11*  CALCIUM  9.6  --  9.5 9.2  PHOS 4.1 2.6 3.1 3.7   Liver Function Tests: Recent Labs  Lab 02/10/24 1525 02/12/24 0417 02/14/24 0847  ALBUMIN  2.5* 2.3* 2.4*   CBC: Recent Labs  Lab 02/10/24 1525 02/12/24 0417 02/14/24 0846  WBC 5.2 8.1 6.8  NEUTROABS  --   --  5.3  HGB 10.2* 10.2* 10.0*  HCT 31.8* 31.7* 31.7*  MCV 100.3* 99.4 99.4  PLT 133* 122* 108*    Medications:  vancomycin  500 mg (02/14/24 1805)    (feeding supplement) PROSource Plus  30 mL Oral Daily   acetaminophen   1,000 mg Oral TID   artificial tears   Right Eye QID   bictegravir-emtricitabine -tenofovir  AF  1 tablet Oral Daily   Chlorhexidine  Gluconate Cloth  6 each  Topical Q0600   darbepoetin (ARANESP ) injection - DIALYSIS  150 mcg Subcutaneous Q Fri-1800   feeding supplement (NEPRO CARB STEADY)  237 mL Oral BID AC & HS   fentaNYL   1 patch Transdermal Q72H   gabapentin   100 mg Oral TID   gatifloxacin   1 drop Right Eye BID   heparin   5,000 Units Subcutaneous Q8H   heparin  sodium (porcine)  3,200 Units Intracatheter Once   lidocaine   2 patch Transdermal Q24H   methocarbamol   500 mg Oral TID   midodrine   10 mg Oral TID WC   midodrine   10 mg Oral Q T,Th,Sa-HD   multivitamin  1 tablet Oral QHS   pantoprazole   40 mg Oral BID AC   polyethylene glycol  17 g Oral Daily   sevelamer  carbonate  800 mg Oral TID WC    Dialysis Orders: [facility] - MWF - Running TTS in hospital.  3.5h  B400  45.5kg  TDC  Heparin  2000 Mircera 100 mcg - last dose 12/01/23 Calcitriol  1.0 mcg (on hold due to high Ca) Uses midodrine  10mg  TID + extra pre-HD  Assessment/Plan: L4-5 discitis/osteomyelitis/psoas abscess: Vanc/Cefepime  through 8/28, now Vanc 500mg  q HD alone until 02/18/24 per ID. S/p IR aspiration 8/27 - growing MRSA. Not a surgical candidate. Pain management per primary, unclear if pain will improve. Plan is repeat imaging in the near future. Diarrhea - per PMD.  ESRD: Historically MWF schedule - on TTS this admit, insistent to stay on that schedule. HD 02/17/24  BP/volume: Chronic hypotension on mido 10mg  TID and extra pre-HD. Below EDW now, will need to lower dry weight on d/c. Not eating much per nursing staff.  Anemia of ESRD: Hgb 10.0 - continue Aranesp  150mcg q Friday. No IV iron  d/t infection. HIV: controlled on Biktarvy  BMD: CorrCa high, VDRA on hold. Phos on goal, on sevelamer  as binder.  Nutrition: Alb low, continue supps and diet has been liberalized.  Encourage nutrition.  Debility: PT/OT signed off due to ongoing refusal of therapy. Dispo: Very difficult placement. Noted patient is uninsured thus SNF is not an option.  No family or safe dispo plan at this  time.  Manuelita Labella, PA-C [facility] 02/15/2024,11:20 AM  LOS: 62 days

## 2024-02-16 LAB — CBC
HCT: 33.9 % — ABNORMAL LOW (ref 39.0–52.0)
Hemoglobin: 10.8 g/dL — ABNORMAL LOW (ref 13.0–17.0)
MCH: 31.6 pg (ref 26.0–34.0)
MCHC: 31.9 g/dL (ref 30.0–36.0)
MCV: 99.1 fL (ref 80.0–100.0)
Platelets: 95 K/uL — ABNORMAL LOW (ref 150–400)
RBC: 3.42 MIL/uL — ABNORMAL LOW (ref 4.22–5.81)
RDW: 19.9 % — ABNORMAL HIGH (ref 11.5–15.5)
WBC: 6.2 K/uL (ref 4.0–10.5)
nRBC: 0 % (ref 0.0–0.2)

## 2024-02-16 LAB — GLUCOSE, CAPILLARY
Glucose-Capillary: 116 mg/dL — ABNORMAL HIGH (ref 70–99)
Glucose-Capillary: 44 mg/dL — CL (ref 70–99)
Glucose-Capillary: 69 mg/dL — ABNORMAL LOW (ref 70–99)
Glucose-Capillary: 48 mg/dL — ABNORMAL LOW (ref 70–99)
Glucose-Capillary: 73 mg/dL (ref 70–99)
Glucose-Capillary: 92 mg/dL (ref 70–99)
Glucose-Capillary: 74 mg/dL (ref 70–99)

## 2024-02-16 LAB — RENAL FUNCTION PANEL
Albumin: 2.2 g/dL — ABNORMAL LOW (ref 3.5–5.0)
Anion gap: 10 (ref 5–15)
BUN: 34 mg/dL — ABNORMAL HIGH (ref 6–20)
CO2: 24 mmol/L (ref 22–32)
Calcium: 9 mg/dL (ref 8.9–10.3)
Chloride: 94 mmol/L — ABNORMAL LOW (ref 98–111)
Creatinine, Ser: 3.09 mg/dL — ABNORMAL HIGH (ref 0.61–1.24)
GFR, Estimated: 23 mL/min — ABNORMAL LOW (ref >60)
Glucose, Bld: 97 mg/dL (ref 70–99)
Phosphorus: 3.6 mg/dL (ref 2.5–4.6)
Potassium: 4.8 mmol/L (ref 3.5–5.1)
Sodium: 128 mmol/L — ABNORMAL LOW (ref 135–145)

## 2024-02-16 MED ORDER — DEXTROSE 50 % IV SOLN
INTRAVENOUS | Status: AC
  Filled 2024-02-16: qty 50

## 2024-02-16 MED ORDER — DEXTROSE 50 % IV SOLN: 25.0000 g | INTRAVENOUS | Status: AC

## 2024-02-16 MED ORDER — PREDNISONE 5 MG PO TABS
5.0000 mg | ORAL_TABLET | Freq: Every day | ORAL | Status: DC
  Administered 2024-02-16 – 2024-02-18 (×2): 5 mg via ORAL
  Filled 2024-02-16 (×2): qty 1

## 2024-02-16 MED ORDER — DEXTROSE 50 % IV SOLN
25.0000 mL | INTRAVENOUS | Status: DC | PRN
  Administered 2024-02-16 – 2024-02-27 (×14): 25 mL via INTRAVENOUS
  Administered 2024-03-01: 50 mL via INTRAVENOUS
  Filled 2024-02-16 (×13): qty 50

## 2024-02-16 NOTE — Plan of Care (Signed)

## 2024-02-16 NOTE — Progress Notes (Signed)
 [facility] Progress Note   Subjective:    Seen in room. Hypoglycemic this am  Alert, no new complaints   Objective Vitals:   02/15/24 1944 02/16/24 0400 02/16/24 0432 02/16/24 0828  BP: (!) 150/89 (!) 81/69 (!) 124/96 110/78  Pulse: 71 76    Resp: 17 16  16   Temp: 98 F (36.7 C) 98.2 F (36.8 C)  97.6 F (36.4 C)  TempSrc:  Oral    SpO2: 98% 99%  (!) 79%  Weight:      Height:       Physical Exam General: Chronically ill, frail, nad  Heart: RRR Lungs: CTAB Abdomen: soft Extremities: no LE edema, B BKA Dialysis Access: [facility]  Additional Objective Labs: Basic Metabolic Panel: Recent Labs  Lab 02/12/24 0417 02/14/24 0847 02/16/24 0712  NA 129* 128* 128*  K 4.5 4.7 4.8  CL 96* 93* 94*  CO2 23 24 24   GLUCOSE 62* 88 97  BUN 38* 38* 34*  CREATININE 3.45* 3.11* 3.09*  CALCIUM  9.5 9.2 9.0  PHOS 3.1 3.7 3.6   CBC: Recent Labs  Lab 02/10/24 1525 02/12/24 0417 02/14/24 0846 02/16/24 0712  WBC 5.2 8.1 6.8 6.2  NEUTROABS  --   --  5.3  --   HGB 10.2* 10.2* 10.0* 10.8*  HCT 31.8* 31.7* 31.7* 33.9*  MCV 100.3* 99.4 99.4 99.1  PLT 133* 122* 108* 95*   Medications:  vancomycin  Stopped (02/14/24 1905)    (feeding supplement) PROSource Plus  30 mL Oral Daily   acetaminophen   1,000 mg Oral TID   artificial tears   Right Eye QID   bictegravir-emtricitabine -tenofovir  AF  1 tablet Oral Daily   Chlorhexidine  Gluconate Cloth  6 each Topical Q0600   darbepoetin (ARANESP ) injection - DIALYSIS  150 mcg Subcutaneous Q Fri-1800   dextrose   25 g Intravenous STAT   dextrose        feeding supplement (NEPRO CARB STEADY)  237 mL Oral BID AC & HS   fentaNYL   1 patch Transdermal Q72H   gabapentin   100 mg Oral TID   gatifloxacin   1 drop Right Eye BID   heparin   5,000 Units Subcutaneous Q8H   heparin  sodium (porcine)  3,200 Units Intracatheter Once   lidocaine   2 patch Transdermal Q24H   methocarbamol   500 mg Oral TID   midodrine   10 mg Oral TID WC   midodrine    10 mg Oral Q T,Th,Sa-HD   multivitamin  1 tablet Oral QHS   pantoprazole   40 mg Oral BID AC   polyethylene glycol  17 g Oral Daily   sevelamer  carbonate  800 mg Oral TID WC    Dialysis Orders [facility] - MWF 3.5h  B400  45.5kg  TDC  Heparin  2000 Mircera 100 mcg - last dose 12/01/23 Calcitriol  1.0 mcg (on hold due to high Ca) Uses midodrine  10mg  TID + extra pre-HD    Assessment/Plan: L4-5 discitis/osteomyelitis/psoas abscess: Vanc/Cefepime  through 8/28, now Vanc 500mg  q HD alone until 02/18/24 per ID. S/p IR aspiration 8/27 - growing MRSA. Not a surgical candidate. Pain management per primary, unclear if pain will improve. Plan is repeat imaging in the near future. ESRD: Historically MWF schedule - on TTS this admit, insistent to stay on that schedule. HD 10/7. BP/volume: Chronic hypotension on mido 10mg  TID and extra pre-HD. Below EDW now.  Anemia of ESRD: Hgb 10.8 - continue Aranesp  150mcg q Friday. No IV iron  d/t infection. HIV: controlled on Biktarvy  BMD: CorrCa high, VDRA on hold.  Phos on goal, on sevelamer  as binder.  Nutrition: Alb low, continue supps and diet has been liberalized. Debility: PT/OT as tolerated. Dispo: Very difficult placement. Noted patient is uninsured thus SNF is not an option.  No family or safe dispo plan at this time.  Maisie Ronnald Acosta PA-C [facility] 02/16/2024,10:37 AM

## 2024-02-16 NOTE — Progress Notes (Signed)
 PROGRESS NOTE  Alvin Daniels  DOB: 05/04/1971  PCP: Pcp, No FMW:[id number]  DOA: 12/14/2023  LOS: 63 days  Hospital Day: 65  Subjective: Patient was seen and examined this afternoon. Propped up in bed.  Not in distress. No family at bedside. Chart reviewed In the last 24 hours, afebrile, blood pressure fluctuating between 80s and 150s Labs from this morning with sodium 128   Brief narrative: Alvin Daniels is a 53 y.o. 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  TEE at that time 6/4 had shown EF 60-65%, 1.8 x 1.2 mass right atrial wall- vegetation from old HD catheter impinged on RA wall-versus mass/tumor. 8/3, presented to the hospital with right hip pain.   Imagings with CT abdomen/pelvis, MRI spine/pelvis showed L4-L5 osteomyelitis with psoas abscess.   Admitted to [facility] Orthopedics consulted Not deemed to be a surgical candidate. 8/27, underwent L4-L5 disc aspiration by IR.  Culture grew MRSA Per ID recommendation, currently on 6-week course of IV vancomycin , EOT 02/18/2024  Assessment and plan: L4-L5 discitis / right psoas abscess  s/p L4/L5 disc aspiration by IR -8/27 - MRSA Patient with right hip pain in the setting of recent h/o MRSA bacteremia Also with recent left fourth finger osteomyelitis -09/2023 (strep/Bacteroides/E. coli on bone culture) As per prior notes,  Patient is not a surgical candidate. 8/27, underwent L4-L5 disc aspiration by IR.  Culture grew MRSA Per ID recommendation, currently on 6-week course of IV vancomycin , EOT 02/18/2024 Will need reimaging at some point-closer to antibiotic EOT  Pain control PRN Pain regimen --- Scheduled: Tylenol  1 g 3 times daily, fentanyl  50 mcg patch, Robaxin  3 times daily, Neurontin  100 mg 3 times daily --- PRN: Ibuprofen  every 8  hours, oxycodone  every 6 hours  Hypoglycemic episodes H/o DM 2 A1c 5.9 on 01/28/2024 Patient is having intermittent hypoglycemic episodes..  Will blood sugar level was 44 this morning.  Asymptomatic.  Refused to drink any orange or apple juice.  Blood glucose level improved with D50. PTA meds-insulin  70/30 Currently only on SSI/Accu-Cheks. Given persistent hypoglycemia, I would add prednisone  5 mg daily Recent Labs  Lab 02/15/24 2233 02/16/24 0632 02/16/24 0705 02/16/24 0829 02/16/24 1158  GLUCAP 111* 44* 116* 69* 48*   ESRD-HD-MWF Nephrology following.    Chronic hypotension Blood pressure is labile, fluctuates between 80s and 150s.   Currently on midodrine  10 mg 3 times daily and additional 10 mg after dialysis Maintain MAP > 65  Hyponatremia Sodium level low but stable Recent Labs  Lab 02/10/24 1525 02/12/24 0417 02/14/24 0847 02/16/24 0712  NA 130* 129* 128* 128*   Anemia of ESRD Hemoglobin low but stable.   Continue Aranesp /iron  defer to nephrology service. Continue PPI Recent Labs    02/07/24 0607 02/10/24 1525 02/12/24 0417 02/14/24 0846 02/16/24 0712  HGB 10.3* 10.2* 10.2* 10.0* 10.8*  MCV 99.4 100.3* 99.4 99.4 99.1   PAD-s/p prior bilateral BKA Normally uses prosthesis. Telling some staff that he 'hasn't walked in years' . Renal navigator seeking collateral information from dialysis center. Due to severe pain-He has been refusing to mobilize with PT  Note-has been refusing physical therapy-multiple times-they have signed off.   HIV  CD4 254 on 5/29 Continue Biktarvy .   Chronic HCV Outpatient follow-up with infectious disease.   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 Continue moxifloxacin eyedrops twice daily OD, ophthalmic lubricant  4 times daily OD.   Chronic intrahepatic /extrahepatic biliary dilatation Incidental finding Per radiology-similar to prior CTs.  Constipation MiraLAX   daily   Difficult disposition: Lives alone, has no friends, unclear if he has family - uninsured-SNF not a option per SW -history of ESRD with bilateral BKA -currently claims unable to ambulate with his prostheses, can't bring prostheses -hence unable to be discharged home safely.   Patient has been refusing PT/OT-multiple times-has resolved-PT/OT have signed off. Normally-prior to this hospitalization was able to ambulate with prosthesis and catch the bus/scat transport to go back and forth from dialysis center.    Mobility:  PT Orders:   PT Follow up Rec: Home Health Pt9/02/2024 1600   Goals of care   Code Status: Full Code     DVT prophylaxis:  heparin  injection 5,000 Units Start: 12/15/23 1400   Antimicrobials: IV vancomycin , Biktarvy  Fluid: None Consultants: Nephrology Family Communication: None at bedside  Status: Inpatient Level of care:  Telemetry Medical   Patient is from: Home Needs to continue in-hospital care: Difficult disposition    Diet:  Diet Order             Diet regular Room service appropriate? Yes with Assist; Fluid consistency: Thin; Fluid restriction: 1200 mL Fluid  Diet effective now                   Scheduled Meds:  (feeding supplement) PROSource Plus  30 mL Oral Daily   acetaminophen   1,000 mg Oral TID   artificial tears   Right Eye QID   bictegravir-emtricitabine -tenofovir  AF  1 tablet Oral Daily   Chlorhexidine  Gluconate Cloth  6 each Topical Q0600   darbepoetin (ARANESP ) injection - DIALYSIS  150 mcg Subcutaneous Q Fri-1800   dextrose   25 g Intravenous STAT   dextrose        feeding supplement (NEPRO CARB STEADY)  237 mL Oral BID AC & HS   fentaNYL   1 patch Transdermal Q72H   gabapentin   100 mg Oral TID   gatifloxacin   1 drop Right Eye BID   heparin   5,000 Units Subcutaneous Q8H   heparin  sodium (porcine)  3,200 Units Intracatheter Once   lidocaine   2 patch Transdermal Q24H   methocarbamol   500 mg Oral TID   midodrine   10  mg Oral TID WC   midodrine   10 mg Oral Q T,Th,Sa-HD   multivitamin  1 tablet Oral QHS   pantoprazole   40 mg Oral BID AC   polyethylene glycol  17 g Oral Daily   predniSONE   5 mg Oral Q breakfast   sevelamer  carbonate  800 mg Oral TID WC    PRN meds: dextrose , dextrose , hydrALAZINE , ibuprofen , ipratropium-albuterol , metoprolol  tartrate, ondansetron  (ZOFRAN ) IV, oxyCODONE , sodium chloride  flush   Infusions:   vancomycin  Stopped (02/14/24 1905)    Antimicrobials: Anti-infectives (From admission, onward)    Start     Dose/Rate Route Frequency Ordered Stop   02/12/24 1800  vancomycin  (VANCOREADY) IVPB 500 mg/100 mL        500 mg 100 mL/hr over 60 Minutes Intravenous Every T-Th-Sa (Hemodialysis) 02/10/24 0958     01/26/24 1800  bictegravir-emtricitabine -tenofovir  AF (BIKTARVY ) 50-200-25 MG per tablet 1 tablet        1 tablet Oral Daily 01/26/24 0853     01/06/24 1800  ceFEPIme  (MAXIPIME ) 2 g in sodium chloride  0.9 % 100 mL IVPB  Status:  Discontinued        2 g 200 mL/hr over 30 Minutes Intravenous  Every T-Th-Sa (1800) 01/05/24 1131 01/08/24 1053   01/06/24 1200  vancomycin  (VANCOREADY) IVPB 500 mg/100 mL  Status:  Discontinued        500 mg 100 mL/hr over 60 Minutes Intravenous Every T-Th-Sa (Hemodialysis) 01/05/24 1130 02/10/24 0958   01/03/24 1200  vancomycin  (VANCOREADY) IVPB 500 mg/100 mL        500 mg 100 mL/hr over 60 Minutes Intravenous  Once 01/02/24 1052 01/03/24 1414   01/03/24 1200  ceFEPIme  (MAXIPIME ) 2 g in sodium chloride  0.9 % 100 mL IVPB        2 g 200 mL/hr over 30 Minutes Intravenous  Once 01/02/24 1212 01/03/24 1343   01/02/24 1200  ceFEPIme  (MAXIPIME ) 2 g in sodium chloride  0.9 % 100 mL IVPB  Status:  Discontinued        2 g 200 mL/hr over 30 Minutes Intravenous Every M-W-F (Hemodialysis) 12/31/23 1221 01/05/24 1131   01/02/24 1200  vancomycin  (VANCOREADY) IVPB 500 mg/100 mL  Status:  Discontinued        500 mg 100 mL/hr over 60 Minutes Intravenous Every M-W-F  (Hemodialysis) 12/31/23 1221 01/05/24 1130   01/01/24 1200  vancomycin  (VANCOREADY) IVPB 500 mg/100 mL        500 mg 100 mL/hr over 60 Minutes Intravenous Every T-Th-Sa (Hemodialysis) 12/31/23 1219 01/01/24 1609   01/01/24 1200  ceFEPIme  (MAXIPIME ) 2 g in sodium chloride  0.9 % 100 mL IVPB        2 g 200 mL/hr over 30 Minutes Intravenous Once in dialysis 12/31/23 1219 01/01/24 1840   12/30/23 1400  vancomycin  (VANCOREADY) IVPB 500 mg/100 mL        500 mg 100 mL/hr over 60 Minutes Intravenous  Once 12/30/23 1310 12/30/23 2033   12/30/23 1400  ceFEPIme  (MAXIPIME ) 1 g in sodium chloride  0.9 % 100 mL IVPB        1 g 200 mL/hr over 30 Minutes Intravenous  Once 12/30/23 1310 12/30/23 2005   12/29/23 2230  ceFEPIme  (MAXIPIME ) 1 g in sodium chloride  0.9 % 100 mL IVPB        1 g 200 mL/hr over 30 Minutes Intravenous  Once 12/29/23 2131 12/30/23 0656   12/29/23 1200  vancomycin  (VANCOREADY) IVPB 500 mg/100 mL  Status:  Discontinued        500 mg 100 mL/hr over 60 Minutes Intravenous Every M-W-F (Hemodialysis) 12/25/23 1313 12/26/23 1056   12/29/23 1200  ceFEPIme  (MAXIPIME ) 2 g in sodium chloride  0.9 % 100 mL IVPB  Status:  Discontinued        2 g 200 mL/hr over 30 Minutes Intravenous Every M-W-F (Hemodialysis) 12/26/23 1056 12/31/23 1221   12/29/23 1200  vancomycin  (VANCOREADY) IVPB 500 mg/100 mL  Status:  Discontinued        500 mg 100 mL/hr over 60 Minutes Intravenous Every M-W-F (Hemodialysis) 12/26/23 1056 12/31/23 1221   12/27/23 1400  ceFEPIme  (MAXIPIME ) 2 g in sodium chloride  0.9 % 100 mL IVPB        2 g 200 mL/hr over 30 Minutes Intravenous  Once 12/27/23 1319 12/27/23 1443   12/27/23 1200  vancomycin  (VANCOREADY) IVPB 500 mg/100 mL  Status:  Discontinued        500 mg 100 mL/hr over 60 Minutes Intravenous Every Sat (Hemodialysis) 12/25/23 1313 12/26/23 1056   12/27/23 1200  ceFEPIme  (MAXIPIME ) 2 g in sodium chloride  0.9 % 100 mL IVPB  Status:  Discontinued        2 g 200 mL/hr over  30  Minutes Intravenous Every Sat (Hemodialysis) 12/26/23 1056 12/27/23 1318   12/27/23 1200  vancomycin  (VANCOREADY) IVPB 500 mg/100 mL        500 mg 100 mL/hr over 60 Minutes Intravenous Every Sat (Hemodialysis) 12/26/23 1056 12/27/23 1227   12/26/23 1700  ciprofloxacin  (CIPRO ) tablet 500 mg        500 mg Oral  Once 12/26/23 1056 12/26/23 1704   12/25/23 1915  ciprofloxacin  (CIPRO ) tablet 500 mg        500 mg Oral  Once 12/25/23 1819 12/25/23 1849   12/25/23 1800  ceFEPIme  (MAXIPIME ) 1 g in sodium chloride  0.9 % 100 mL IVPB  Status:  Discontinued        1 g 200 mL/hr over 30 Minutes Intravenous Every 24 hours 12/25/23 1316 12/26/23 1056   12/25/23 1200  ceFEPIme  (MAXIPIME ) 2 g in sodium chloride  0.9 % 100 mL IVPB  Status:  Discontinued        2 g 200 mL/hr over 30 Minutes Intravenous Once in dialysis 12/24/23 1739 12/25/23 1316   12/25/23 1200  vancomycin  (VANCOCIN ) IVPB 1000 mg/200 mL premix  Status:  Discontinued        1,000 mg 200 mL/hr over 60 Minutes Intravenous  Once 12/24/23 1739 12/24/23 1809   12/25/23 1200  vancomycin  (VANCOREADY) IVPB 500 mg/100 mL  Status:  Discontinued        500 mg 100 mL/hr over 60 Minutes Intravenous  Once 12/24/23 1809 12/25/23 1313   12/24/23 1200  ceFEPIme  (MAXIPIME ) 2 g in sodium chloride  0.9 % 100 mL IVPB  Status:  Discontinued        2 g 200 mL/hr over 30 Minutes Intravenous Every M-W-F (Hemodialysis) 12/22/23 0851 12/25/23 1316   12/19/23 0000  ceFEPime  (MAXIPIME ) IVPB        2 g Intravenous Every M-W-F (Hemodialysis) 12/19/23 1300 02/09/24 2359   12/19/23 0000  vancomycin  IVPB        500 mg Intravenous Every M-W-F (Hemodialysis) 12/19/23 1300 02/09/24 2359   12/16/23 2200  ceFEPIme  (MAXIPIME ) 1 g in sodium chloride  0.9 % 100 mL IVPB        1 g 200 mL/hr over 30 Minutes Intravenous Every 24 hours 12/15/23 1348 12/22/23 2203   12/15/23 2230  vancomycin  (VANCOREADY) IVPB 500 mg/100 mL  Status:  Discontinued        500 mg 100 mL/hr over 60 Minutes  Intravenous Every M-W-F (Hemodialysis) 12/15/23 2217 12/25/23 1313   12/15/23 1400  bictegravir-emtricitabine -tenofovir  AF (BIKTARVY ) 50-200-25 MG per tablet 1 tablet  Status:  Discontinued        1 tablet Oral Daily 12/15/23 1307 01/26/24 0853   12/15/23 1347  vancomycin  variable dose per unstable renal function (pharmacist dosing)  Status:  Discontinued         Does not apply See admin instructions 12/15/23 1348 12/15/23 2218   12/15/23 1100  ceFEPIme  (MAXIPIME ) 2 g in sodium chloride  0.9 % 100 mL IVPB        2 g 200 mL/hr over 30 Minutes Intravenous  Once 12/15/23 1048 12/15/23 1130   12/15/23 1100  vancomycin  (VANCOCIN ) IVPB 1000 mg/200 mL premix        1,000 mg 200 mL/hr over 60 Minutes Intravenous  Once 12/15/23 1048 12/15/23 1154       Objective: Vitals:   02/16/24 0432 02/16/24 0828  BP: (!) 124/96 110/78  Pulse:    Resp:  16  Temp:  97.6 F (36.4 C)  SpO2:  ROLLEN)  79%    Intake/Output Summary (Last 24 hours) at 02/16/2024 1356 Last data filed at 02/16/2024 1300 Gross per 24 hour  Intake 1060 ml  Output --  Net 1060 ml   Filed Weights   02/14/24 0820 02/14/24 0825 02/14/24 1115  Weight: 4.8 kg 43.8 kg 43.8 kg   Weight change:  Body mass index is 19.5 kg/m.   Physical Exam: General exam: Pleasant, middle-aged Hispanic male.  Looks older for his age Skin: No rashes, lesions or ulcers. HEENT: Atraumatic, normocephalic, no obvious bleeding Lungs: Clear to auscultation bilaterally,  CVS: S1, S2, no murmur,   GI/Abd: Soft, nontender, nondistended, bowel sound present,   CNS: Alert, awake, blind Psychiatry: Sad affect Extremities: Bilateral BKA status  Data Review: I have personally reviewed the laboratory data and studies available.  F/u labs  Unresulted Labs (From admission, onward)     Start     Ordered   01/13/24 0500  Vancomycin , random  Every Tuesday,   R     Question:  Specimen collection method  Answer:  Lab=Lab collect   01/12/24 1459             Signed, Chapman Rota, MD [facility] 02/16/2024

## 2024-02-16 NOTE — Progress Notes (Signed)
 BG 44 this morning, pt was awake. Refused to drink OJ and apple juice again. Gave D50 stat. rechecked BG 116. Will continue to monitor. Provider notified.

## 2024-02-17 LAB — GLUCOSE, CAPILLARY
Glucose-Capillary: 45 mg/dL — ABNORMAL LOW (ref 70–99)
Glucose-Capillary: 55 mg/dL — ABNORMAL LOW (ref 70–99)
Glucose-Capillary: 61 mg/dL — ABNORMAL LOW (ref 70–99)
Glucose-Capillary: 69 mg/dL — ABNORMAL LOW (ref 70–99)

## 2024-02-17 LAB — CBC
HCT: 31.4 % — ABNORMAL LOW (ref 39.0–52.0)
Hemoglobin: 10.1 g/dL — ABNORMAL LOW (ref 13.0–17.0)
MCH: 32.1 pg (ref 26.0–34.0)
MCHC: 32.2 g/dL (ref 30.0–36.0)
MCV: 99.7 fL (ref 80.0–100.0)
Platelets: 90 K/uL — ABNORMAL LOW (ref 150–400)
RBC: 3.15 MIL/uL — ABNORMAL LOW (ref 4.22–5.81)
RDW: 19.9 % — ABNORMAL HIGH (ref 11.5–15.5)
WBC: 8.1 K/uL (ref 4.0–10.5)
nRBC: 0 % (ref 0.0–0.2)

## 2024-02-17 LAB — RENAL FUNCTION PANEL
Albumin: 2.4 g/dL — ABNORMAL LOW (ref 3.5–5.0)
Anion gap: 14 (ref 5–15)
BUN: 48 mg/dL — ABNORMAL HIGH (ref 6–20)
CO2: 22 mmol/L (ref 22–32)
Calcium: 9 mg/dL (ref 8.9–10.3)
Chloride: 93 mmol/L — ABNORMAL LOW (ref 98–111)
Creatinine, Ser: 3.6 mg/dL — ABNORMAL HIGH (ref 0.61–1.24)
GFR, Estimated: 19 mL/min — ABNORMAL LOW (ref >60)
Glucose, Bld: 84 mg/dL (ref 70–99)
Phosphorus: 4.9 mg/dL — ABNORMAL HIGH (ref 2.5–4.6)
Potassium: 5.4 mmol/L — ABNORMAL HIGH (ref 3.5–5.1)
Sodium: 129 mmol/L — ABNORMAL LOW (ref 135–145)

## 2024-02-17 LAB — VANCOMYCIN, RANDOM: Vancomycin Rm: 25 ug/mL

## 2024-02-17 MED ORDER — MIDODRINE HCL 5 MG PO TABS
ORAL_TABLET | ORAL | Status: AC
  Filled 2024-02-17: qty 2

## 2024-02-17 MED ORDER — ALTEPLASE 2 MG IJ SOLR: 2.0000 mg | Freq: Once | INTRAMUSCULAR | Status: DC | PRN

## 2024-02-17 MED ORDER — HEPARIN SODIUM (PORCINE) 1000 UNIT/ML IJ SOLN
INTRAMUSCULAR | Status: AC
  Filled 2024-02-17: qty 2

## 2024-02-17 MED ORDER — HEPARIN SODIUM (PORCINE) 1000 UNIT/ML DIALYSIS
2000.0000 [IU] | INTRAMUSCULAR | Status: DC | PRN
  Administered 2024-02-17: 2000 [IU] via INTRAVENOUS_CENTRAL
  Filled 2024-02-17: qty 2

## 2024-02-17 MED ORDER — PENTAFLUOROPROP-TETRAFLUOROETH EX AERO: 1.0000 | INHALATION_SPRAY | CUTANEOUS | Status: DC | PRN

## 2024-02-17 MED ORDER — LIDOCAINE-PRILOCAINE 2.5-2.5 % EX CREA: 1.0000 | TOPICAL_CREAM | CUTANEOUS | Status: DC | PRN

## 2024-02-17 MED ORDER — HEPARIN SODIUM (PORCINE) 1000 UNIT/ML IJ SOLN
INTRAMUSCULAR | Status: AC
Start: 2024-02-17 — End: 2024-02-17
  Filled 2024-02-17: qty 4

## 2024-02-17 MED ORDER — HEPARIN SODIUM (PORCINE) 1000 UNIT/ML DIALYSIS: 1000.0000 [IU] | INTRAMUSCULAR | Status: DC | PRN

## 2024-02-17 MED ORDER — LIDOCAINE HCL (PF) 1 % IJ SOLN: 5.0000 mL | INTRAMUSCULAR | Status: DC | PRN

## 2024-02-17 MED ORDER — ANTICOAGULANT SODIUM CITRATE 4% (200MG/5ML) IV SOLN: 5.0000 mL | Status: DC | PRN

## 2024-02-17 NOTE — Plan of Care (Signed)

## 2024-02-17 NOTE — Progress Notes (Signed)
 Pharmacy Antibiotic Note  Alvin Daniels is a 53 y.o. male admitted on 12/14/2023 with osteomyelitis.  Pharmacy has been consulted for vancomycin  dosing. Last dose was 500mg  IV with HD session on 10/4. Dose on 9/30 held for supra-therapeutic level of 28. Morning pre-HD level is 25.   Plan: Continue with today's vancomycin  500mg  dose.  Goal pre-HD level: 15-25 mcg/mL. EOT per ID 02/18/2024.  Will obtain another random level prior to next HD session to determine if dose needs to be held, patient likely accumulating given upper end therapeutic level after 1x dose held last week.   Height: 4' 11 (149.9 cm) Weight: 43.8 kg (96 lb 9 oz) IBW/kg (Calculated) : 47.7  Temp (24hrs), Avg:98.3 F (36.8 C), Min:97.6 F (36.4 C), Max:98.9 F (37.2 C)  Recent Labs  Lab 02/10/24 1525 02/12/24 0417 02/14/24 0846 02/14/24 0847 02/16/24 0712 02/17/24 0628  WBC 5.2 8.1 6.8  --  6.2  --   CREATININE 4.00* 3.45*  --  3.11* 3.09*  --   VANCORANDOM  --   --   --   --   --  25    Estimated Creatinine Clearance: 17.1 mL/min (A) (by C-G formula based on SCr of 3.09 mg/dL (H)).    No Known Allergies   Microbiology results: 8/4 BCx: ngtd x5d  8/27 Wound Cx: MRSA   Thank you for allowing pharmacy to be a part of this patient's care.  Powell Blush, PharmD, BCCCP   02/17/2024 7:23 AM

## 2024-02-17 NOTE — Procedures (Signed)
 Patient seen and examined on Hemodialysis. The procedure was supervised and I have made appropriate changes. BP 107/64   Pulse 65   Temp (!) 92.4 F (33.6 C) Comment: axillary notified, MD-[place] Surges and MD Dahal.  Resp 12   Ht 4' 11 (1.499 m)   Wt 41.7 kg   SpO2 100%   BMI 18.57 kg/m   QB 400 mL/ min, UF goal 2L  Tolerating treatment without complaints at this time.   Alvin Bonine MD [facility] [phone] 10:37 AM

## 2024-02-17 NOTE — Progress Notes (Signed)
 [facility] Progress Note   Subjective:     Seen and examined.  Low temp this AM- applied warm blankets.  NO real complaints.   Objective Vitals:   02/17/24 0901 02/17/24 0931 02/17/24 1001 02/17/24 1031  BP: 117/76 96/64 102/64 107/64  Pulse: 64 64 64 65  Resp: 10 13 10 12   Temp:      TempSrc:      SpO2: 100% 100% 99% 100%  Weight:      Height:       Physical Exam General: Chronically ill, frail, nad  Heart: RRR Lungs: CTAB Abdomen: soft Extremities: no LE edema, B BKA, some necrotic appearing ulcerations on L hand Dialysis Access: [facility]  Additional Objective Labs: Basic Metabolic Panel: Recent Labs  Lab 02/14/24 0847 02/16/24 0712 02/17/24 0840  NA 128* 128* 129*  K 4.7 4.8 5.4*  CL 93* 94* 93*  CO2 24 24 22   GLUCOSE 88 97 84  BUN 38* 34* 48*  CREATININE 3.11* 3.09* 3.60*  CALCIUM  9.2 9.0 9.0  PHOS 3.7 3.6 4.9*   CBC: Recent Labs  Lab 02/10/24 1525 02/12/24 0417 02/14/24 0846 02/16/24 0712 02/17/24 0840  WBC 5.2 8.1 6.8 6.2 8.1  NEUTROABS  --   --  5.3  --   --   HGB 10.2* 10.2* 10.0* 10.8* 10.1*  HCT 31.8* 31.7* 31.7* 33.9* 31.4*  MCV 100.3* 99.4 99.4 99.1 99.7  PLT 133* 122* 108* 95* 90*   Medications:  anticoagulant sodium citrate      vancomycin  Stopped (02/14/24 1905)    (feeding supplement) PROSource Plus  30 mL Oral Daily   acetaminophen   1,000 mg Oral TID   artificial tears   Right Eye QID   bictegravir-emtricitabine -tenofovir  AF  1 tablet Oral Daily   Chlorhexidine  Gluconate Cloth  6 each Topical Q0600   darbepoetin (ARANESP ) injection - DIALYSIS  150 mcg Subcutaneous Q Fri-1800   feeding supplement (NEPRO CARB STEADY)  237 mL Oral BID AC & HS   fentaNYL   1 patch Transdermal Q72H   gabapentin   100 mg Oral TID   gatifloxacin   1 drop Right Eye BID   heparin   5,000 Units Subcutaneous Q8H   heparin  sodium (porcine)  3,200 Units Intracatheter Once   lidocaine   2 patch Transdermal Q24H   methocarbamol   500 mg Oral TID    midodrine   10 mg Oral TID WC   midodrine   10 mg Oral Q T,Th,Sa-HD   multivitamin  1 tablet Oral QHS   pantoprazole   40 mg Oral BID AC   polyethylene glycol  17 g Oral Daily   predniSONE   5 mg Oral Q breakfast   sevelamer  carbonate  800 mg Oral TID WC    Dialysis Orders [facility] - MWF 3.5h  B400  45.5kg  TDC  Heparin  2000 Mircera 100 mcg - last dose 12/01/23 Calcitriol  1.0 mcg (on hold due to high Ca) Uses midodrine  10mg  TID + extra pre-HD    Assessment/Plan: L4-5 discitis/osteomyelitis/psoas abscess: Vanc/Cefepime  through 8/28, now Vanc 500mg  q HD alone until 02/18/24 per ID. S/p IR aspiration 8/27 - growing MRSA. Not a surgical candidate. Pain management per primary, unclear if pain will improve. Plan is repeat imaging in the near future. ESRD: Historically MWF schedule - on TTS this admit, prefers to stay on that schedule. HD 10/7. BP/volume: Chronic hypotension on mido 10mg  TID and extra pre-HD. Below EDW now.  Anemia of ESRD: Hgb 10.8 - continue Aranesp  150mcg q Friday. No IV iron  d/t infection.  HIV: controlled on Biktarvy  BMD: CorrCa high, VDRA on hold. Phos on goal, on sevelamer  as binder.  Nutrition: Alb low, continue supps and diet has been liberalized. Debility: PT/OT as tolerated. Dispo: Very difficult placement. Noted patient is uninsured thus SNF is not an option.  No family or safe dispo plan at this time.  Almarie Bonine MD [facility] 02/17/2024,10:36 AM

## 2024-02-17 NOTE — Progress Notes (Signed)
 PROGRESS NOTE  Alvin Daniels  DOB: [birth year]  PCP: Pcp, No FMW:[id number]  DOA: 12/14/2023  LOS: 64 days  Hospital Day: 66  Subjective: Patient was seen and examined this morning in dialysis unit. Was hypothermic to 92 earlier in the morning.  Improved during dialysis.  Blood pressure in low normal range mostly, 100s this morning. Labs this morning with sodium 129, potassium 5.4, hemoglobin 10.1  Brief narrative: Rupert Hammad is a 53 y.o. 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  TEE at that time 6/4 had shown EF 60-65%, 1.8 x 1.2 mass right atrial wall- vegetation from old HD catheter impinged on RA wall-versus mass/tumor. 8/3, presented to the hospital with right hip pain.   Imagings with CT abdomen/pelvis, MRI spine/pelvis showed L4-L5 osteomyelitis with psoas abscess.   Admitted to [facility] Orthopedics consulted Not deemed to be a surgical candidate. 8/27, underwent L4-L5 disc aspiration by IR.  Culture grew MRSA Per ID recommendation, currently on 6-week course of IV vancomycin , EOT 02/18/2024  Assessment and plan: L4-L5 discitis / right psoas abscess  s/p L4/L5 disc aspiration by IR -8/27 - MRSA Patient with right hip pain in the setting of recent h/o MRSA bacteremia Also with recent left fourth finger osteomyelitis -09/2023 (strep/Bacteroides/E. coli on bone culture) As per prior notes,  Patient is not a surgical candidate. 8/27, underwent L4-L5 disc aspiration by IR.  Culture grew MRSA Per ID recommendation, currently on 6-week course of IV vancomycin , EOT 02/18/2024 Per previous note, Will need reimaging at some point-closer to antibiotic EOT.  I have ordered repeat MRI lumbar spine today. Pain control PRN Pain regimen --- Scheduled: Tylenol  1 g 3 times daily, fentanyl  50 mcg patch,  Robaxin  3 times daily, Neurontin  100 mg 3 times daily --- PRN: Ibuprofen  every 8 hours, oxycodone  every 6 hours  Recurrent hypoglycemic episodes H/o DM 2 A1c 5.9 on 01/28/2024 Patient is having intermittent hypoglycemic episodes. Blood sugar level was low again at 45 this morning.  Given persistent hypoglycemia, I started him on prednisone  5 mg daily on 10/6.  Continue to monitor.  Continue SSI/Accu-Cheks. Recent Labs  Lab 02/16/24 1158 02/16/24 1417 02/16/24 1721 02/16/24 2117 02/17/24 0620  GLUCAP 48* 73 92 74 45*   ESRD-HD-MWF Nephrology following.    Chronic hypotension Blood pressure is labile, fluctuates between 80s and 150s.   Currently on midodrine  10 mg 3 times daily and additional 10 mg after dialysis Maintain MAP > 65  Hyponatremia Sodium level low but stable Recent Labs  Lab 02/10/24 1525 02/12/24 0417 02/14/24 0847 02/16/24 0712 02/17/24 0840  NA 130* 129* 128* 128* 129*   Anemia of ESRD Hemoglobin low but stable.   Continue Aranesp /iron  defer to nephrology service. Continue PPI Recent Labs    02/10/24 1525 02/12/24 0417 02/14/24 0846 02/16/24 0712 02/17/24 0840  HGB 10.2* 10.2* 10.0* 10.8* 10.1*  MCV 100.3* 99.4 99.4 99.1 99.7   PAD-s/p prior bilateral BKA Normally uses prosthesis. Telling some staff that he 'hasn't walked in years' . Renal navigator seeking collateral information from dialysis center. Due to severe pain-He has been refusing to mobilize with PT  Note-has been refusing physical therapy-multiple times-they have signed off.   HIV  CD4 254 on 5/29 Continue Biktarvy .   Chronic HCV Outpatient follow-up with infectious disease.   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 Continue moxifloxacin eyedrops twice daily  OD, ophthalmic lubricant 4 times daily OD.   Chronic intrahepatic /extrahepatic biliary dilatation Incidental finding Per radiology-similar to prior  CTs.  Constipation MiraLAX  daily   Difficult disposition: Lives alone, has no friends, unclear if he has family - uninsured-SNF not a option per SW -history of ESRD with bilateral BKA -currently claims unable to ambulate with his prostheses, can't bring prostheses -hence unable to be discharged home safely.   Patient has been refusing PT/OT-multiple times-has resolved-PT/OT have signed off. Normally-prior to this hospitalization was able to ambulate with prosthesis and catch the bus/scat transport to go back and forth from dialysis center.    Mobility:  PT Orders:   PT Follow up Rec: Home Health Pt9/02/2024 1600   Goals of care   Code Status: Full Code     DVT prophylaxis:  heparin  injection 5,000 Units Start: 12/15/23 1400   Antimicrobials: IV vancomycin , Biktarvy  Fluid: None Consultants: Nephrology Family Communication: None at bedside  Status: Inpatient Level of care:  Telemetry Medical   Patient is from: Home Needs to continue in-hospital care: Difficult disposition    Diet:  Diet Order             Diet regular Room service appropriate? Yes with Assist; Fluid consistency: Thin; Fluid restriction: 1200 mL Fluid  Diet effective now                   Scheduled Meds:  (feeding supplement) PROSource Plus  30 mL Oral Daily   acetaminophen   1,000 mg Oral TID   artificial tears   Right Eye QID   bictegravir-emtricitabine -tenofovir  AF  1 tablet Oral Daily   Chlorhexidine  Gluconate Cloth  6 each Topical Q0600   darbepoetin (ARANESP ) injection - DIALYSIS  150 mcg Subcutaneous Q Fri-1800   feeding supplement (NEPRO CARB STEADY)  237 mL Oral BID AC & HS   fentaNYL   1 patch Transdermal Q72H   gabapentin   100 mg Oral TID   gatifloxacin   1 drop Right Eye BID   heparin   5,000 Units Subcutaneous Q8H   heparin  sodium (porcine)  3,200 Units Intracatheter Once   lidocaine   2 patch Transdermal Q24H   methocarbamol   500 mg Oral TID   midodrine   10 mg Oral TID WC    midodrine   10 mg Oral Q T,Th,Sa-HD   multivitamin  1 tablet Oral QHS   pantoprazole   40 mg Oral BID AC   polyethylene glycol  17 g Oral Daily   predniSONE   5 mg Oral Q breakfast   sevelamer  carbonate  800 mg Oral TID WC    PRN meds: alteplase , anticoagulant sodium citrate , dextrose , heparin , heparin , hydrALAZINE , ibuprofen , ipratropium-albuterol , lidocaine  (PF), lidocaine -prilocaine , metoprolol  tartrate, ondansetron  (ZOFRAN ) IV, oxyCODONE , pentafluoroprop-tetrafluoroeth, sodium chloride  flush   Infusions:   anticoagulant sodium citrate      vancomycin  Stopped (02/14/24 1905)    Antimicrobials: Anti-infectives (From admission, onward)    Start     Dose/Rate Route Frequency Ordered Stop   02/12/24 1800  vancomycin  (VANCOREADY) IVPB 500 mg/100 mL        500 mg 100 mL/hr over 60 Minutes Intravenous Every T-Th-Sa (Hemodialysis) 02/10/24 0958     01/26/24 1800  bictegravir-emtricitabine -tenofovir  AF (BIKTARVY ) 50-200-25 MG per tablet 1 tablet        1 tablet Oral Daily 01/26/24 0853     01/06/24 1800  ceFEPIme  (MAXIPIME ) 2 g in sodium chloride  0.9 % 100 mL IVPB  Status:  Discontinued        2 g 200 mL/hr  over 30 Minutes Intravenous Every T-Th-Sa (1800) 01/05/24 1131 01/08/24 1053   01/06/24 1200  vancomycin  (VANCOREADY) IVPB 500 mg/100 mL  Status:  Discontinued        500 mg 100 mL/hr over 60 Minutes Intravenous Every T-Th-Sa (Hemodialysis) 01/05/24 1130 02/10/24 0958   01/03/24 1200  vancomycin  (VANCOREADY) IVPB 500 mg/100 mL        500 mg 100 mL/hr over 60 Minutes Intravenous  Once 01/02/24 1052 01/03/24 1414   01/03/24 1200  ceFEPIme  (MAXIPIME ) 2 g in sodium chloride  0.9 % 100 mL IVPB        2 g 200 mL/hr over 30 Minutes Intravenous  Once 01/02/24 1212 01/03/24 1343   01/02/24 1200  ceFEPIme  (MAXIPIME ) 2 g in sodium chloride  0.9 % 100 mL IVPB  Status:  Discontinued        2 g 200 mL/hr over 30 Minutes Intravenous Every M-W-F (Hemodialysis) 12/31/23 1221 01/05/24 1131   01/02/24  1200  vancomycin  (VANCOREADY) IVPB 500 mg/100 mL  Status:  Discontinued        500 mg 100 mL/hr over 60 Minutes Intravenous Every M-W-F (Hemodialysis) 12/31/23 1221 01/05/24 1130   01/01/24 1200  vancomycin  (VANCOREADY) IVPB 500 mg/100 mL        500 mg 100 mL/hr over 60 Minutes Intravenous Every T-Th-Sa (Hemodialysis) 12/31/23 1219 01/01/24 1609   01/01/24 1200  ceFEPIme  (MAXIPIME ) 2 g in sodium chloride  0.9 % 100 mL IVPB        2 g 200 mL/hr over 30 Minutes Intravenous Once in dialysis 12/31/23 1219 01/01/24 1840   12/30/23 1400  vancomycin  (VANCOREADY) IVPB 500 mg/100 mL        500 mg 100 mL/hr over 60 Minutes Intravenous  Once 12/30/23 1310 12/30/23 2033   12/30/23 1400  ceFEPIme  (MAXIPIME ) 1 g in sodium chloride  0.9 % 100 mL IVPB        1 g 200 mL/hr over 30 Minutes Intravenous  Once 12/30/23 1310 12/30/23 2005   12/29/23 2230  ceFEPIme  (MAXIPIME ) 1 g in sodium chloride  0.9 % 100 mL IVPB        1 g 200 mL/hr over 30 Minutes Intravenous  Once 12/29/23 2131 12/30/23 0656   12/29/23 1200  vancomycin  (VANCOREADY) IVPB 500 mg/100 mL  Status:  Discontinued        500 mg 100 mL/hr over 60 Minutes Intravenous Every M-W-F (Hemodialysis) 12/25/23 1313 12/26/23 1056   12/29/23 1200  ceFEPIme  (MAXIPIME ) 2 g in sodium chloride  0.9 % 100 mL IVPB  Status:  Discontinued        2 g 200 mL/hr over 30 Minutes Intravenous Every M-W-F (Hemodialysis) 12/26/23 1056 12/31/23 1221   12/29/23 1200  vancomycin  (VANCOREADY) IVPB 500 mg/100 mL  Status:  Discontinued        500 mg 100 mL/hr over 60 Minutes Intravenous Every M-W-F (Hemodialysis) 12/26/23 1056 12/31/23 1221   12/27/23 1400  ceFEPIme  (MAXIPIME ) 2 g in sodium chloride  0.9 % 100 mL IVPB        2 g 200 mL/hr over 30 Minutes Intravenous  Once 12/27/23 1319 12/27/23 1443   12/27/23 1200  vancomycin  (VANCOREADY) IVPB 500 mg/100 mL  Status:  Discontinued        500 mg 100 mL/hr over 60 Minutes Intravenous Every Sat (Hemodialysis) 12/25/23 1313 12/26/23  1056   12/27/23 1200  ceFEPIme  (MAXIPIME ) 2 g in sodium chloride  0.9 % 100 mL IVPB  Status:  Discontinued  2 g 200 mL/hr over 30 Minutes Intravenous Every Sat (Hemodialysis) 12/26/23 1056 12/27/23 1318   12/27/23 1200  vancomycin  (VANCOREADY) IVPB 500 mg/100 mL        500 mg 100 mL/hr over 60 Minutes Intravenous Every Sat (Hemodialysis) 12/26/23 1056 12/27/23 1227   12/26/23 1700  ciprofloxacin  (CIPRO ) tablet 500 mg        500 mg Oral  Once 12/26/23 1056 12/26/23 1704   12/25/23 1915  ciprofloxacin  (CIPRO ) tablet 500 mg        500 mg Oral  Once 12/25/23 1819 12/25/23 1849   12/25/23 1800  ceFEPIme  (MAXIPIME ) 1 g in sodium chloride  0.9 % 100 mL IVPB  Status:  Discontinued        1 g 200 mL/hr over 30 Minutes Intravenous Every 24 hours 12/25/23 1316 12/26/23 1056   12/25/23 1200  ceFEPIme  (MAXIPIME ) 2 g in sodium chloride  0.9 % 100 mL IVPB  Status:  Discontinued        2 g 200 mL/hr over 30 Minutes Intravenous Once in dialysis 12/24/23 1739 12/25/23 1316   12/25/23 1200  vancomycin  (VANCOCIN ) IVPB 1000 mg/200 mL premix  Status:  Discontinued        1,000 mg 200 mL/hr over 60 Minutes Intravenous  Once 12/24/23 1739 12/24/23 1809   12/25/23 1200  vancomycin  (VANCOREADY) IVPB 500 mg/100 mL  Status:  Discontinued        500 mg 100 mL/hr over 60 Minutes Intravenous  Once 12/24/23 1809 12/25/23 1313   12/24/23 1200  ceFEPIme  (MAXIPIME ) 2 g in sodium chloride  0.9 % 100 mL IVPB  Status:  Discontinued        2 g 200 mL/hr over 30 Minutes Intravenous Every M-W-F (Hemodialysis) 12/22/23 0851 12/25/23 1316   12/19/23 0000  ceFEPime  (MAXIPIME ) IVPB        2 g Intravenous Every M-W-F (Hemodialysis) 12/19/23 1300 02/09/24 2359   12/19/23 0000  vancomycin  IVPB        500 mg Intravenous Every M-W-F (Hemodialysis) 12/19/23 1300 02/09/24 2359   12/16/23 2200  ceFEPIme  (MAXIPIME ) 1 g in sodium chloride  0.9 % 100 mL IVPB        1 g 200 mL/hr over 30 Minutes Intravenous Every 24 hours 12/15/23 1348  12/22/23 2203   12/15/23 2230  vancomycin  (VANCOREADY) IVPB 500 mg/100 mL  Status:  Discontinued        500 mg 100 mL/hr over 60 Minutes Intravenous Every M-W-F (Hemodialysis) 12/15/23 2217 12/25/23 1313   12/15/23 1400  bictegravir-emtricitabine -tenofovir  AF (BIKTARVY ) 50-200-25 MG per tablet 1 tablet  Status:  Discontinued        1 tablet Oral Daily 12/15/23 1307 01/26/24 0853   12/15/23 1347  vancomycin  variable dose per unstable renal function (pharmacist dosing)  Status:  Discontinued         Does not apply See admin instructions 12/15/23 1348 12/15/23 2218   12/15/23 1100  ceFEPIme  (MAXIPIME ) 2 g in sodium chloride  0.9 % 100 mL IVPB        2 g 200 mL/hr over 30 Minutes Intravenous  Once 12/15/23 1048 12/15/23 1130   12/15/23 1100  vancomycin  (VANCOCIN ) IVPB 1000 mg/200 mL premix        1,000 mg 200 mL/hr over 60 Minutes Intravenous  Once 12/15/23 1048 12/15/23 1154       Objective: Vitals:   02/17/24 0931 02/17/24 1001  BP: 96/64 102/64  Pulse: 64 64  Resp: 13 10  Temp:    SpO2:  100% 99%    Intake/Output Summary (Last 24 hours) at 02/17/2024 1027 Last data filed at 02/16/2024 1300 Gross per 24 hour  Intake 360 ml  Output --  Net 360 ml   Filed Weights   02/14/24 0825 02/14/24 1115 02/17/24 0819  Weight: 43.8 kg 43.8 kg 41.7 kg   Weight change:  Body mass index is 18.57 kg/m.   Physical Exam: General exam: Pleasant, middle-aged Hispanic male.  Looks older for his age Skin: No rashes, lesions or ulcers. HEENT: Atraumatic, normocephalic, no obvious bleeding Lungs: Clear to auscultation bilaterally,  CVS: S1, S2, no murmur,   GI/Abd: Soft, nontender, nondistended, bowel sound present,   CNS: Alert, awake, blind Psychiatry: Sad affect Extremities: Prior bilateral BKA status  Data Review: I have personally reviewed the laboratory data and studies available.  F/u labs  Unresulted Labs (From admission, onward)     Start     Ordered   01/13/24 0500  Vancomycin ,  random  Every Tuesday,   R     Question:  Specimen collection method  Answer:  Lab=Lab collect   01/12/24 1459            Signed, Chapman Rota, MD [facility] 02/17/2024

## 2024-02-18 ENCOUNTER — Inpatient Hospital Stay (HOSPITAL_COMMUNITY): Payer: MEDICAID

## 2024-02-18 DIAGNOSIS — B9562 Methicillin resistant Staphylococcus aureus infection as the cause of diseases classified elsewhere: Secondary | ICD-10-CM

## 2024-02-18 DIAGNOSIS — M4637 Infection of intervertebral disc (pyogenic), lumbosacral region: Secondary | ICD-10-CM

## 2024-02-18 DIAGNOSIS — L02211 Cutaneous abscess of abdominal wall: Secondary | ICD-10-CM

## 2024-02-18 DIAGNOSIS — G061 Intraspinal abscess and granuloma: Secondary | ICD-10-CM

## 2024-02-18 LAB — GLUCOSE, CAPILLARY
Glucose-Capillary: 25 mg/dL — CL (ref 70–99)
Glucose-Capillary: 98 mg/dL (ref 70–99)
Glucose-Capillary: 69 mg/dL — ABNORMAL LOW (ref 70–99)
Glucose-Capillary: 64 mg/dL — ABNORMAL LOW (ref 70–99)
Glucose-Capillary: 92 mg/dL (ref 70–99)
Glucose-Capillary: 87 mg/dL (ref 70–99)

## 2024-02-18 LAB — C-REACTIVE PROTEIN: CRP: 5.1 mg/dL — ABNORMAL HIGH (ref <1.0)

## 2024-02-18 LAB — SEDIMENTATION RATE: Sed Rate: 20 mm/h — ABNORMAL HIGH (ref 0–16)

## 2024-02-18 LAB — CK: Total CK: 35 U/L — ABNORMAL LOW (ref 49–397)

## 2024-02-18 MED ORDER — DEXTROSE 50 % IV SOLN
INTRAVENOUS | Status: AC
  Administered 2024-02-18: 50 mL
  Filled 2024-02-18: qty 50

## 2024-02-18 MED ORDER — CHLORHEXIDINE GLUCONATE CLOTH 2 % EX PADS
6.0000 | MEDICATED_PAD | Freq: Every day | CUTANEOUS | Status: DC
Start: 2024-02-18 — End: 2024-02-22
  Administered 2024-02-18 – 2024-02-22 (×5): 6 via TOPICAL

## 2024-02-18 MED ORDER — DAPTOMYCIN 500 MG IV SOLR
400.0000 mg | INTRAVENOUS | Status: DC
  Administered 2024-02-18: 400 mg via INTRAVENOUS
  Filled 2024-02-18: qty 8

## 2024-02-18 MED ORDER — LOPERAMIDE HCL 2 MG PO CAPS
2.0000 mg | ORAL_CAPSULE | Freq: Four times a day (QID) | ORAL | Status: AC | PRN
  Administered 2024-02-18 – 2024-02-20 (×4): 2 mg via ORAL
  Filled 2024-02-18 (×4): qty 1

## 2024-02-18 MED ORDER — ACYCLOVIR 5 % EX OINT
1.0000 | TOPICAL_OINTMENT | Freq: Every day | CUTANEOUS | Status: DC
  Administered 2024-02-18 – 2024-02-29 (×61): 1 via TOPICAL
  Filled 2024-02-18 (×4): qty 15

## 2024-02-18 MED ORDER — DEXTROSE 10 % IV SOLN
INTRAVENOUS | Status: DC
  Filled 2024-02-18: qty 1000

## 2024-02-18 NOTE — Progress Notes (Signed)
 PROGRESS NOTE  Alvin Daniels  DOB: 03/03/1971  PCP: Pcp, No FMW:[id number]  DOA: 12/14/2023  LOS: 65 days  Hospital Day: 67  Subjective: Patient was seen and examined this morning. Middle-aged Hispanic male.  Lying down on bed.  Not in distress. Afebrile, hemodynamically stable, breathing room air Noted low blood sugar episodes in the last 24 hours as low as 45 this morning.  Brief narrative: Alvin Daniels is a 53 y.o. 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  TEE at that time 6/4 had shown EF 60-65%, 1.8 x 1.2 mass right atrial wall- vegetation from old HD catheter impinged on RA wall-versus mass/tumor. 8/3, presented to the hospital with right hip pain.   Imagings with CT abdomen/pelvis, MRI spine/pelvis showed L4-L5 osteomyelitis with psoas abscess.   Admitted to [facility] consulted Not deemed to be a surgical candidate. 8/27, underwent L4-L5 disc aspiration by IR.  Culture grew MRSA Per ID recommendation, currently on 6-week course of IV vancomycin , EOT 02/18/2024  10/7, repeat lumbar spine MRI showed significant worsening of findings as below 1. Progressive L4-L5 discitis osteomyelitis since late August. 2. Subtotal L4 vertebral body destruction now, with increased severe multifactorial L4 level spinal stenosis. 3. New multiloculated dorsal epidural abscess tracking up from L4 to the L1-L2 level, asymmetric to the right, and with associated new multilevel spinal stenosis and also involving the right L1, L2, and L3 neural foramina. 4. Additional new posterior right paraspinal abscesses at L4-L5, individually up to 1.5 cm. Right psoas muscle abscesses not significantly changed. 5. Severe multifactorial spinal canal stenosis at L4, progressed. Epidural abscess-related stenosis is mild  at L1-L2, moderate at L2-L3, and severe at L3.   Assessment and plan: L4-L5 discitis / right psoas abscess  s/p L4/L5 disc aspiration by IR -8/27 - MRSA Patient with right hip pain in the setting of recent h/o MRSA bacteremia Also with recent left fourth finger osteomyelitis -09/2023 (strep/Bacteroides/E. coli on bone culture) As per prior notes,  Patient is not a surgical candidate. 8/27, underwent L4-L5 disc aspiration by IR.  Culture grew MRSA Per ID recommendation, currently on 6-week course of IV vancomycin , EOT 02/18/2024. As recommended in previous ID notes, I repeated an MRI lumbar spine yesterday 10/7 with significant findings as above.  Hence I have requested consultation with ID and neurosurgery again.  Pain control PRN Pain regimen --- Scheduled: Tylenol  1 g 3 times daily, fentanyl  50 mcg patch, Robaxin  3 times daily, Neurontin  100 mg 3 times daily --- PRN: Ibuprofen  every 8 hours, oxycodone  every 6 hours  Recurrent hypoglycemic episodes H/o DM 2 A1c 5.9 on 01/28/2024 Patient is having intermittent hypoglycemic episodes. Blood sugar level persistently remain low. 10/6, I started him on prednisone  5 g daily.  Given worsening of epidural abscess, I will stop prednisone . Start low rate D10 drip at 20 mL/h Request nutrition consult Continue SSI/Accu-Cheks. Recent Labs  Lab 02/17/24 1624 02/17/24 2136 02/18/24 0601 02/18/24 0629 02/18/24 1115  GLUCAP 61* 69* 25* 98 69*   ESRD-HD-MWF Nephrology following.    Chronic hypotension Blood pressure is labile, fluctuates between 80s and 150s.   Currently on midodrine  10 mg 3 times daily and additional 10 mg after dialysis. Maintain MAP > 65  Hyponatremia Sodium level low but stable Recent Labs  Lab 02/12/24 0417 02/14/24 0847 02/16/24 0712 02/17/24 0840  NA 129* 128* 128* 129*   Anemia  of ESRD Hemoglobin low but stable.   Continue Aranesp /iron  defer to nephrology service. Continue PPI Recent Labs    02/10/24 1525  02/12/24 0417 02/14/24 0846 02/16/24 0712 02/17/24 0840  HGB 10.2* 10.2* 10.0* 10.8* 10.1*  MCV 100.3* 99.4 99.4 99.1 99.7   PAD-s/p prior bilateral BKA Normally uses prosthesis. Telling some staff that he 'hasn't walked in years' . Renal navigator seeking collateral information from dialysis center. Due to severe pain-He has been refusing to mobilize with PT  Note-has been refusing physical therapy-multiple times-they have signed off.   HIV  CD4 254 on 5/29 Continue Biktarvy .   Chronic HCV Outpatient follow-up with infectious disease.   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 Continue moxifloxacin eyedrops twice daily OD, ophthalmic lubricant 4 times daily OD.   Chronic intrahepatic /extrahepatic biliary dilatation Incidental finding Per radiology-similar to prior CTs.  Constipation MiraLAX  daily   Difficult disposition: Lives alone, has no friends, unclear if he has family - uninsured-SNF not a option per SW -history of ESRD with bilateral BKA -currently claims unable to ambulate with his prostheses, can't bring prostheses -hence unable to be discharged home safely.   Patient has been refusing PT/OT-multiple times-has resolved-PT/OT have signed off. Normally-prior to this hospitalization was able to ambulate with prosthesis and catch the bus/scat transport to go back and forth from dialysis center.    Mobility:  PT Orders:   PT Follow up Rec: Home Health Pt9/02/2024 1600   Goals of care   Code Status: Full Code  Palliative care consult requested today    DVT prophylaxis:  heparin  injection 5,000 Units Start: 12/15/23 1400   Antimicrobials: IV vancomycin , Biktarvy  Fluid: None Consultants: Nephrology, ID, neurosurgery Family Communication: None at bedside  Status: Inpatient Level of care:  Telemetry Medical   Patient is from: Home Needs to continue in-hospital care: Difficult disposition    Diet:   Diet Order             Diet regular Room service appropriate? Yes with Assist; Fluid consistency: Thin; Fluid restriction: 1200 mL Fluid  Diet effective now                   Scheduled Meds:  (feeding supplement) PROSource Plus  30 mL Oral Daily   acetaminophen   1,000 mg Oral TID   artificial tears   Right Eye QID   bictegravir-emtricitabine -tenofovir  AF  1 tablet Oral Daily   Chlorhexidine  Gluconate Cloth  6 each Topical Q0600   darbepoetin (ARANESP ) injection - DIALYSIS  150 mcg Subcutaneous Q Fri-1800   feeding supplement (NEPRO CARB STEADY)  237 mL Oral BID AC & HS   fentaNYL   1 patch Transdermal Q72H   gabapentin   100 mg Oral TID   gatifloxacin   1 drop Right Eye BID   heparin   5,000 Units Subcutaneous Q8H   lidocaine   2 patch Transdermal Q24H   methocarbamol   500 mg Oral TID   midodrine   10 mg Oral TID WC   midodrine   10 mg Oral Q T,Th,Sa-HD   multivitamin  1 tablet Oral QHS   pantoprazole   40 mg Oral BID AC   polyethylene glycol  17 g Oral Daily   sevelamer  carbonate  800 mg Oral TID WC    PRN meds: dextrose , hydrALAZINE , ibuprofen , ipratropium-albuterol , metoprolol  tartrate, ondansetron  (ZOFRAN ) IV, oxyCODONE , sodium chloride  flush   Infusions:   dextrose  20 mL/hr at 02/18/24 1233     Antimicrobials: Anti-infectives (From admission, onward)    Start  Dose/Rate Route Frequency Ordered Stop   02/12/24 1800  vancomycin  (VANCOREADY) IVPB 500 mg/100 mL        500 mg 100 mL/hr over 60 Minutes Intravenous Every T-Th-Sa (Hemodialysis) 02/10/24 0958 02/17/24 1819   01/26/24 1800  bictegravir-emtricitabine -tenofovir  AF (BIKTARVY ) 50-200-25 MG per tablet 1 tablet        1 tablet Oral Daily 01/26/24 0853     01/06/24 1800  ceFEPIme  (MAXIPIME ) 2 g in sodium chloride  0.9 % 100 mL IVPB  Status:  Discontinued        2 g 200 mL/hr over 30 Minutes Intravenous Every T-Th-Sa (1800) 01/05/24 1131 01/08/24 1053   01/06/24 1200  vancomycin  (VANCOREADY) IVPB 500 mg/100 mL   Status:  Discontinued        500 mg 100 mL/hr over 60 Minutes Intravenous Every T-Th-Sa (Hemodialysis) 01/05/24 1130 02/10/24 0958   01/03/24 1200  vancomycin  (VANCOREADY) IVPB 500 mg/100 mL        500 mg 100 mL/hr over 60 Minutes Intravenous  Once 01/02/24 1052 01/03/24 1414   01/03/24 1200  ceFEPIme  (MAXIPIME ) 2 g in sodium chloride  0.9 % 100 mL IVPB        2 g 200 mL/hr over 30 Minutes Intravenous  Once 01/02/24 1212 01/03/24 1343   01/02/24 1200  ceFEPIme  (MAXIPIME ) 2 g in sodium chloride  0.9 % 100 mL IVPB  Status:  Discontinued        2 g 200 mL/hr over 30 Minutes Intravenous Every M-W-F (Hemodialysis) 12/31/23 1221 01/05/24 1131   01/02/24 1200  vancomycin  (VANCOREADY) IVPB 500 mg/100 mL  Status:  Discontinued        500 mg 100 mL/hr over 60 Minutes Intravenous Every M-W-F (Hemodialysis) 12/31/23 1221 01/05/24 1130   01/01/24 1200  vancomycin  (VANCOREADY) IVPB 500 mg/100 mL        500 mg 100 mL/hr over 60 Minutes Intravenous Every T-Th-Sa (Hemodialysis) 12/31/23 1219 01/01/24 1609   01/01/24 1200  ceFEPIme  (MAXIPIME ) 2 g in sodium chloride  0.9 % 100 mL IVPB        2 g 200 mL/hr over 30 Minutes Intravenous Once in dialysis 12/31/23 1219 01/01/24 1840   12/30/23 1400  vancomycin  (VANCOREADY) IVPB 500 mg/100 mL        500 mg 100 mL/hr over 60 Minutes Intravenous  Once 12/30/23 1310 12/30/23 2033   12/30/23 1400  ceFEPIme  (MAXIPIME ) 1 g in sodium chloride  0.9 % 100 mL IVPB        1 g 200 mL/hr over 30 Minutes Intravenous  Once 12/30/23 1310 12/30/23 2005   12/29/23 2230  ceFEPIme  (MAXIPIME ) 1 g in sodium chloride  0.9 % 100 mL IVPB        1 g 200 mL/hr over 30 Minutes Intravenous  Once 12/29/23 2131 12/30/23 0656   12/29/23 1200  vancomycin  (VANCOREADY) IVPB 500 mg/100 mL  Status:  Discontinued        500 mg 100 mL/hr over 60 Minutes Intravenous Every M-W-F (Hemodialysis) 12/25/23 1313 12/26/23 1056   12/29/23 1200  ceFEPIme  (MAXIPIME ) 2 g in sodium chloride  0.9 % 100 mL IVPB   Status:  Discontinued        2 g 200 mL/hr over 30 Minutes Intravenous Every M-W-F (Hemodialysis) 12/26/23 1056 12/31/23 1221   12/29/23 1200  vancomycin  (VANCOREADY) IVPB 500 mg/100 mL  Status:  Discontinued        500 mg 100 mL/hr over 60 Minutes Intravenous Every M-W-F (Hemodialysis) 12/26/23 1056 12/31/23 1221   12/27/23 1400  ceFEPIme  (MAXIPIME ) 2 g in sodium chloride  0.9 % 100 mL IVPB        2 g 200 mL/hr over 30 Minutes Intravenous  Once 12/27/23 1319 12/27/23 1443   12/27/23 1200  vancomycin  (VANCOREADY) IVPB 500 mg/100 mL  Status:  Discontinued        500 mg 100 mL/hr over 60 Minutes Intravenous Every Sat (Hemodialysis) 12/25/23 1313 12/26/23 1056   12/27/23 1200  ceFEPIme  (MAXIPIME ) 2 g in sodium chloride  0.9 % 100 mL IVPB  Status:  Discontinued        2 g 200 mL/hr over 30 Minutes Intravenous Every Sat (Hemodialysis) 12/26/23 1056 12/27/23 1318   12/27/23 1200  vancomycin  (VANCOREADY) IVPB 500 mg/100 mL        500 mg 100 mL/hr over 60 Minutes Intravenous Every Sat (Hemodialysis) 12/26/23 1056 12/27/23 1227   12/26/23 1700  ciprofloxacin  (CIPRO ) tablet 500 mg        500 mg Oral  Once 12/26/23 1056 12/26/23 1704   12/25/23 1915  ciprofloxacin  (CIPRO ) tablet 500 mg        500 mg Oral  Once 12/25/23 1819 12/25/23 1849   12/25/23 1800  ceFEPIme  (MAXIPIME ) 1 g in sodium chloride  0.9 % 100 mL IVPB  Status:  Discontinued        1 g 200 mL/hr over 30 Minutes Intravenous Every 24 hours 12/25/23 1316 12/26/23 1056   12/25/23 1200  ceFEPIme  (MAXIPIME ) 2 g in sodium chloride  0.9 % 100 mL IVPB  Status:  Discontinued        2 g 200 mL/hr over 30 Minutes Intravenous Once in dialysis 12/24/23 1739 12/25/23 1316   12/25/23 1200  vancomycin  (VANCOCIN ) IVPB 1000 mg/200 mL premix  Status:  Discontinued        1,000 mg 200 mL/hr over 60 Minutes Intravenous  Once 12/24/23 1739 12/24/23 1809   12/25/23 1200  vancomycin  (VANCOREADY) IVPB 500 mg/100 mL  Status:  Discontinued        500 mg 100 mL/hr  over 60 Minutes Intravenous  Once 12/24/23 1809 12/25/23 1313   12/24/23 1200  ceFEPIme  (MAXIPIME ) 2 g in sodium chloride  0.9 % 100 mL IVPB  Status:  Discontinued        2 g 200 mL/hr over 30 Minutes Intravenous Every M-W-F (Hemodialysis) 12/22/23 0851 12/25/23 1316   12/19/23 0000  ceFEPime  (MAXIPIME ) IVPB        2 g Intravenous Every M-W-F (Hemodialysis) 12/19/23 1300 02/09/24 2359   12/19/23 0000  vancomycin  IVPB        500 mg Intravenous Every M-W-F (Hemodialysis) 12/19/23 1300 02/09/24 2359   12/16/23 2200  ceFEPIme  (MAXIPIME ) 1 g in sodium chloride  0.9 % 100 mL IVPB        1 g 200 mL/hr over 30 Minutes Intravenous Every 24 hours 12/15/23 1348 12/22/23 2203   12/15/23 2230  vancomycin  (VANCOREADY) IVPB 500 mg/100 mL  Status:  Discontinued        500 mg 100 mL/hr over 60 Minutes Intravenous Every M-W-F (Hemodialysis) 12/15/23 2217 12/25/23 1313   12/15/23 1400  bictegravir-emtricitabine -tenofovir  AF (BIKTARVY ) 50-200-25 MG per tablet 1 tablet  Status:  Discontinued        1 tablet Oral Daily 12/15/23 1307 01/26/24 0853   12/15/23 1347  vancomycin  variable dose per unstable renal function (pharmacist dosing)  Status:  Discontinued         Does not apply See admin instructions 12/15/23 1348 12/15/23 2218   12/15/23 1100  ceFEPIme  (MAXIPIME ) 2 g in sodium chloride  0.9 % 100 mL IVPB        2 g 200 mL/hr over 30 Minutes Intravenous  Once 12/15/23 1048 12/15/23 1130   12/15/23 1100  vancomycin  (VANCOCIN ) IVPB 1000 mg/200 mL premix        1,000 mg 200 mL/hr over 60 Minutes Intravenous  Once 12/15/23 1048 12/15/23 1154       Objective: Vitals:   02/18/24 0600 02/18/24 0755  BP: 102/67 125/88  Pulse: 72 69  Resp: 15 17  Temp:    SpO2: 97% 98%    Intake/Output Summary (Last 24 hours) at 02/18/2024 1308 Last data filed at 02/18/2024 0609 Gross per 24 hour  Intake 640 ml  Output 200 ml  Net 440 ml   Filed Weights   02/14/24 1115 02/17/24 0819 02/17/24 1154  Weight: 43.8 kg 41.7  kg 43.9 kg   Weight change:  Body mass index is 19.55 kg/m.   Physical Exam: General exam: Pleasant, middle-aged Hispanic male. Looks older for his age Skin: No rashes, lesions or ulcers. HEENT: Atraumatic, normocephalic, no obvious bleeding Lungs: Clear to auscultation bilaterally,  CVS: S1, S2, no murmur,   GI/Abd: Soft, nontender, nondistended, bowel sound present,   CNS: Alert, awake, blind Psychiatry: Sad affect Extremities: Prior bilateral BKA status  Data Review: I have personally reviewed the laboratory data and studies available.  F/u labs  Unresulted Labs (From admission, onward)     Start     Ordered   02/18/24 1305  C-reactive protein  Once,   R       Question:  Specimen collection method  Answer:  Lab=Lab collect   02/18/24 1304   02/18/24 1304  Sedimentation rate  Once,   R       Question:  Specimen collection method  Answer:  Lab=Lab collect   02/18/24 1304   01/13/24 0500  Vancomycin , random  Every Tuesday,   R     Question:  Specimen collection method  Answer:  Lab=Lab collect   01/12/24 1459            Signed, Chapman Rota, MD [facility] 02/18/2024

## 2024-02-18 NOTE — Progress Notes (Signed)
 Hypoglycemic Event  CBG: 25  Treatment: D50 50 mL (25 gm)  Symptoms: None  Follow-up CBG: Time:0629 CBG Result:98  Possible Reasons for Event: Unknown  Comments/MD notified:James Blondie, NP notified by charge RN Alvin Daniels.     Alvin Daniels

## 2024-02-18 NOTE — Plan of Care (Signed)

## 2024-02-18 NOTE — Progress Notes (Signed)
 [facility] Progress Note   Subjective:    Seen and examined patient at bedside. Appears comfortable, no acute issues. Noted low temp during HD yesterday. Seems he tolerated treatment well. Next HD 10/9.  Objective Vitals:   02/17/24 1227 02/17/24 2006 02/18/24 0600 02/18/24 0755  BP: 93/66 138/87 102/67 125/88  Pulse: 78 79 72 69  Resp: 15 18 15 17   Temp: (!) 97.4 F (36.3 C) (!) 97.4 F (36.3 C)    TempSrc:  Oral    SpO2: 100% 92% 97% 98%  Weight:      Height:       Physical Exam General: Chronically ill, frail, nad  Heart: RRR Lungs: CTAB Abdomen: soft Extremities: no LE edema, B BKA, some necrotic appearing ulcerations on L hand Dialysis Access: [facility]  Filed Weights   02/14/24 1115 02/17/24 0819 02/17/24 1154  Weight: 43.8 kg 41.7 kg 43.9 kg    Intake/Output Summary (Last 24 hours) at 02/18/2024 1016 Last data filed at 02/18/2024 0609 Gross per 24 hour  Intake 640 ml  Output -600 ml  Net 1240 ml    Additional Objective Labs: Basic Metabolic Panel: Recent Labs  Lab 02/14/24 0847 02/16/24 0712 02/17/24 0840  NA 128* 128* 129*  K 4.7 4.8 5.4*  CL 93* 94* 93*  CO2 24 24 22   GLUCOSE 88 97 84  BUN 38* 34* 48*  CREATININE 3.11* 3.09* 3.60*  CALCIUM  9.2 9.0 9.0  PHOS 3.7 3.6 4.9*   Liver Function Tests: Recent Labs  Lab 02/14/24 0847 02/16/24 0712 02/17/24 0840  ALBUMIN  2.4* 2.2* 2.4*   No results for input(s): LIPASE, AMYLASE in the last 168 hours. CBC: Recent Labs  Lab 02/12/24 0417 02/14/24 0846 02/16/24 0712 02/17/24 0840  WBC 8.1 6.8 6.2 8.1  NEUTROABS  --  5.3  --   --   HGB 10.2* 10.0* 10.8* 10.1*  HCT 31.7* 31.7* 33.9* 31.4*  MCV 99.4 99.4 99.1 99.7  PLT 122* 108* 95* 90*   Blood Culture    Component Value Date/Time   SDES NEEDLE ASPIRATE 01/07/2024 1612   SPECREQUEST DISC 01/07/2024 1612   CULT  01/07/2024 1612    FEW METHICILLIN RESISTANT STAPHYLOCOCCUS AUREUS NO ANAEROBES ISOLATED Performed at [facility], [address] [place], [place] [address]    REPTSTATUS 01/12/2024 FINAL 01/07/2024 1612    Cardiac Enzymes: No results for input(s): CKTOTAL, CKMB, CKMBINDEX, TROPONINI in the last 168 hours. CBG: Recent Labs  Lab 02/17/24 1223 02/17/24 1624 02/17/24 2136 02/18/24 0601 02/18/24 0629  GLUCAP 55* 61* 69* 25* 98   Iron  Studies: No results for input(s): IRON , TIBC, TRANSFERRIN, FERRITIN in the last 72 hours. Lab Results  Component Value Date   INR 1.1 01/07/2024   INR 1.1 12/15/2023   INR 1.2 10/08/2023     Medications:   (feeding supplement) PROSource Plus  30 mL Oral Daily   acetaminophen   1,000 mg Oral TID   artificial tears   Right Eye QID   bictegravir-emtricitabine -tenofovir  AF  1 tablet Oral Daily   Chlorhexidine  Gluconate Cloth  6 each Topical Q0600   darbepoetin (ARANESP ) injection - DIALYSIS  150 mcg Subcutaneous Q Fri-1800   feeding supplement (NEPRO CARB STEADY)  237 mL Oral BID AC & HS   fentaNYL   1 patch Transdermal Q72H   gabapentin   100 mg Oral TID   gatifloxacin   1 drop Right Eye BID   heparin   5,000 Units Subcutaneous Q8H   lidocaine   2 patch  Transdermal Q24H   methocarbamol   500 mg Oral TID   midodrine   10 mg Oral TID WC   midodrine   10 mg Oral Q T,Th,Sa-HD   multivitamin  1 tablet Oral QHS   pantoprazole   40 mg Oral BID AC   polyethylene glycol  17 g Oral Daily   predniSONE   5 mg Oral Q breakfast   sevelamer  carbonate  800 mg Oral TID WC    Dialysis Orders: [facility] - MWF 3.5h  B400  45.5kg  TDC  Heparin  2000 Mircera 100 mcg - last dose 12/01/23 Calcitriol  1.0 mcg (on hold due to high Ca) Uses midodrine  10mg  TID + extra pre-HD   Assessment/Plan: L4-5 discitis/osteomyelitis/psoas abscess: Vanc/Cefepime  through 8/28, now Vanc 500mg  q HD alone until 02/18/24 per ID. S/p IR aspiration 8/27 - growing MRSA. Not a surgical candidate. Pain management per primary, unclear if pain will improve. Plan is repeat imaging in the near  future. ESRD: Historically MWF schedule - on TTS this admit, prefers to stay on that schedule. Next HD 10/9. BP/volume: Chronic hypotension on mido 10mg  TID and extra pre-HD. Below EDW now.  Anemia of ESRD: Hgb 10.8 - continue Aranesp  150mcg q Friday. No IV iron  d/t infection. HIV: controlled on Biktarvy  BMD: CorrCa high, VDRA on hold. Phos on goal, on sevelamer  as binder.  Nutrition: Alb low, continue supps and diet has been liberalized. Debility: PT/OT as tolerated. Dispo: Very difficult placement. Noted patient is uninsured thus SNF is not an option.  No family or safe dispo plan at this time.  Charmaine Piety, NP [facility] 02/18/2024,10:16 AM  LOS: 65 days

## 2024-02-18 NOTE — Progress Notes (Signed)
 [facility]  Date of Admission:  12/14/2023      Total days of antibiotics Since 10/08/23  Vancomycin          ASSESSMENT: Alvin Daniels is a 53 y.o. male here for prolonged hospitalization with:   H/O MRSA Vertebral Infection with Epidural Abscess -  Lumbar osteomyelitis (L4) -  Psoas abscess -  He has had heavy history of MRSA in the past, last positive blood culture 5/28, aspirate from vertebra which grew out MRSA. MRI 8/25 with worsening epidural phlegmon/abscess and stable psoas collection.  Repeat MRI today with new changes - progressive L4-5 discitis/OM since August (which was already described to have been worsening) now with destruction and increased stenosis L4.  **New multiloculated dorsal epidural abscess tracking L4 to L1/2 asymmetric with associated new multilevel spinal stenosis and New posterior right paraspinal abscess at L4-5 up to 1.5 cm. Rt psoas abscesses not significantly changed.  - Will continue vancomycin  treatment given new abscess in psoas muscle.  - Will await NSGY evaluation - I have concerns that he has failed medical therapy with prolonged antibiotics (essentially since June). His CRP remains high (though improved @ 5.1) I believe he may need surgical intervention to help us  clear his infection.  If no surgical recommendations will at least ask IR to repeat aspiration from one of the attainable abscesses to re-run micro to ensure no changes in MIC to Vancomycin  is observed or (less likely) any concern for new organism.   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  If not a candidate would at least see if IR can aspirate any of the psoas abscesses to re-run micro Continue biktarvy  for HIV treatment.    Principal Problem:   Psoas abscess ([name]) Active Problems:   ESRD on hemodialysis ([name])   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)   Severe protein-calorie malnutrition   Normocytic anemia   S/P bilateral BKA (below knee amputation) ([name])   Chronic hepatitis C without hepatic coma ([name])   HIV (human immunodeficiency virus infection) ([name])   Hx MRSA infection   Acute osteomyelitis of lumbar spine ([name])   Epidural abscess   Paraspinal abscess ([name])   Pyogenic inflammation of bone ([name])    (feeding supplement) PROSource Plus  30 mL Oral Daily   acetaminophen   1,000 mg Oral TID   artificial tears   Right Eye QID   bictegravir-emtricitabine -tenofovir  AF  1 tablet Oral Daily   Chlorhexidine  Gluconate Cloth  6 each Topical Q0600   darbepoetin (ARANESP ) injection - DIALYSIS  150 mcg Subcutaneous Q Fri-1800   feeding supplement (NEPRO CARB STEADY)  237 mL Oral BID AC & HS   fentaNYL   1 patch Transdermal Q72H   gabapentin   100 mg Oral TID   gatifloxacin   1 drop Right Eye BID   heparin   5,000 Units Subcutaneous Q8H   lidocaine   2 patch Transdermal Q24H   methocarbamol   500 mg Oral TID   midodrine   10 mg Oral TID WC   midodrine   10 mg Oral Q T,Th,Sa-HD   multivitamin  1 tablet Oral  QHS   pantoprazole   40 mg Oral BID AC   sevelamer  carbonate  800 mg Oral TID WC    SUBJECTIVE: Hard to tell what's worsened and what's stable but does not feel good.   Review of Systems: Review of Systems  Constitutional:  Positive for malaise/fatigue. Negative for chills and fever.  Musculoskeletal:  Positive for back pain.  Neurological:  Positive for weakness (generalized).    No Known Allergies  OBJECTIVE: Vitals:   02/17/24 2006 02/18/24 0600 02/18/24 0755 02/18/24 1405  BP: 138/87 102/67 125/88 (!) 97/35  Pulse:  79 72 69 69  Resp: 18 15 17    Temp: (!) 97.4 F (36.3 C)   97.6 F (36.4 C)  TempSrc: Oral     SpO2: 92% 97% 98% 98%  Weight:      Height:       Body mass index is 19.55 kg/m.  Physical Exam Constitutional:      Appearance: Normal appearance. He is not ill-appearing.  HENT:     Head: Normocephalic.     Mouth/Throat:     Mouth: Mucous membranes are moist.     Pharynx: Oropharynx is clear.  Eyes:     General: No scleral icterus. Cardiovascular:     Rate and Rhythm: Normal rate and regular rhythm.     Heart sounds: No murmur heard. Pulmonary:     Effort: Pulmonary effort is normal.  Abdominal:     General: Bowel sounds are normal. There is no distension.     Palpations: Abdomen is soft.  Musculoskeletal:        General: Normal range of motion.     Cervical back: Normal range of motion.     Comments: Vertebral tenderness at lower spine, uncomfortable with palpation   Skin:    Capillary Refill: Capillary refill takes less than 2 seconds.     Coloration: Skin is not jaundiced or pale.  Neurological:     Mental Status: He is alert and oriented to person, place, and time.  Psychiatric:        Mood and Affect: Mood normal.        Judgment: Judgment normal.     Lab Results Lab Results  Component Value Date   WBC 8.1 02/17/2024   HGB 10.1 (L) 02/17/2024   HCT 31.4 (L) 02/17/2024   MCV 99.7 02/17/2024   PLT 90 (L) 02/17/2024    Lab Results  Component Value Date   CREATININE 3.60 (H) 02/17/2024   BUN 48 (H) 02/17/2024   NA 129 (L) 02/17/2024   K 5.4 (H) 02/17/2024   CL 93 (L) 02/17/2024   CO2 22 02/17/2024    Lab Results  Component Value Date   ALT 9 12/15/2023   AST 19 12/15/2023   ALKPHOS 74 12/15/2023   BILITOT 1.8 (H) 12/15/2023     Microbiology: No results found for this or any previous visit (from the past 240 hours).    Corean Fireman, MSN, NP-C [facility]   [email or web address] Pager: [phone] Office: [phone] [facility] Main Line: [phone] *Secure Chat Communication Welcome  Total Encounter Time: 65 minutes

## 2024-02-18 NOTE — Progress Notes (Signed)
 Nutrition Follow-up  DOCUMENTATION CODES:   Severe malnutrition in context of chronic illness  INTERVENTION:  Discussed bowel regimen with MD, asked to discontinue miralax  as pt is having diarrhea and will not eat until his diarrhea improves (which is leading to hypoglycemic episodes) Continue liberalized diet to maximize intake  Continue 30 ml ProSource Plus daily, each supplement provides 100 kcals and 15 grams protein.   Continue Nepro Shake po daily before breakfast and at bedtime, each supplement provides 425 kcal and 19 grams protein   Continue renal MVI  NUTRITION DIAGNOSIS:  Severe Malnutrition related to chronic illness as evidenced by severe muscle depletion, severe fat depletion. Remains applicable  GOAL:  Patient will meet greater than or equal to 90% of their needs Progressing  MONITOR:  PO intake, Supplement acceptance  REASON FOR ASSESSMENT:   Rounds Poor PO  ASSESSMENT:   Pt with PMH significant for: ESRD-HD, HIV, PAD s/p bilateral BKA, chronic HCV. Of note, recently admiteed 5/28 - 6/06 for MRSA bacteremia with left fourth finger osteomyelitis (amputation 5/29). Presenting to hospital w/ R hip pain and found to have L4-L5 osteomyelitis with abscess.  Pt resting in bed at time of assessment. Pt complains of persistent diarrhea after he eats anything, miralax  still being given to pt even with diarrhea. Tech reports pt had multiple loose stools in the last 24 hr which required him to be cleaned up (reports 3 type 7 during nightshift and 1 type 7 this morning). MD discontinued miralax  and will monitor for need for imodium . Pt agreeable to start eating again as diarrhea improves to prevent future hypoglycemic episodes.   Admit Weight: 40.3 kg Current Weight: 43.9 kg Last HD tx: 10/7  UF achieved: ongoing, - yesterday   Nephrology reports pt tolerating HD well. Pt had fever during last HD treatment but did not need to end treatment early. Next HD tomorrow 10/9.     Drains/Lines: LUE: AVF  HDC: right internal jugular (double lumen)  Sodium remains low. Could be some fluid overload and/or poor oral intake. PHOS/K+ within range for dialysis patient.   Meds: Biktarvy , darbepoetin alfa , dextrose  50%,  pantoprazole , midodrine , RenaVit daily, Renvela , dextrose  10% infusion  Labs from 9/13 reviewed: Na 129/ Chloride 93 Potassium 5.4 Phos 4.9 BUN 48/Cr 3.60 CBG x 24 hr: 25-98 mg/dL  Diet Order:   Diet Order             Diet regular Room service appropriate? Yes with Assist; Fluid consistency: Thin; Fluid restriction: 1200 mL Fluid  Diet effective now             EDUCATION NEEDS:  Education needs have been addressed  Skin:  Skin Assessment: Skin Integrity Issues: Skin Integrity Issues:: Stage II Stage II: sacrum  Last BM:  per tech 4 type 7 bm in last 24 hr  Height:  Ht Readings from Last 1 Encounters:  12/14/23 4' 11 (1.499 m)   Weight:  Wt Readings from Last 1 Encounters:  02/17/24 43.9 kg   Ideal Body Weight:  45.5 kg  BMI:  Body mass index is 19.55 kg/m.  Estimated Nutritional Needs:   Kcal:  1400-1600 kcals  Protein:  65-80g  Fluid:  1L + UOP    Josette Glance, MS, RDN, LDN Clinical Dietitian I Please reach out via secure chat

## 2024-02-19 ENCOUNTER — Encounter (HOSPITAL_COMMUNITY): Payer: Self-pay | Admitting: Hospitalist

## 2024-02-19 LAB — CBC WITH DIFFERENTIAL/PLATELET
Abs Immature Granulocytes: 0.02 K/uL (ref 0.00–0.07)
Basophils Absolute: 0 K/uL (ref 0.0–0.1)
Basophils Relative: 0 %
Eosinophils Absolute: 0 K/uL (ref 0.0–0.5)
Eosinophils Relative: 1 %
HCT: 30 % — ABNORMAL LOW (ref 39.0–52.0)
Hemoglobin: 9.9 g/dL — ABNORMAL LOW (ref 13.0–17.0)
Immature Granulocytes: 0 %
Lymphocytes Relative: 14 %
Lymphs Abs: 0.9 K/uL (ref 0.7–4.0)
MCH: 32.5 pg (ref 26.0–34.0)
MCHC: 33 g/dL (ref 30.0–36.0)
MCV: 98.4 fL (ref 80.0–100.0)
Monocytes Absolute: 0.2 K/uL (ref 0.1–1.0)
Monocytes Relative: 3 %
Neutro Abs: 5.6 K/uL (ref 1.7–7.7)
Neutrophils Relative %: 82 %
Platelets: 82 K/uL — ABNORMAL LOW (ref 150–400)
RBC: 3.05 MIL/uL — ABNORMAL LOW (ref 4.22–5.81)
RDW: 19.8 % — ABNORMAL HIGH (ref 11.5–15.5)
WBC: 6.8 K/uL (ref 4.0–10.5)
nRBC: 0 % (ref 0.0–0.2)

## 2024-02-19 LAB — BASIC METABOLIC PANEL WITH GFR
Anion gap: 11 (ref 5–15)
BUN: 46 mg/dL — ABNORMAL HIGH (ref 6–20)
CO2: 24 mmol/L (ref 22–32)
Calcium: 8.5 mg/dL — ABNORMAL LOW (ref 8.9–10.3)
Chloride: 91 mmol/L — ABNORMAL LOW (ref 98–111)
Creatinine, Ser: 3.11 mg/dL — ABNORMAL HIGH (ref 0.61–1.24)
GFR, Estimated: 23 mL/min — ABNORMAL LOW (ref >60)
Glucose, Bld: 66 mg/dL — ABNORMAL LOW (ref 70–99)
Potassium: 4.5 mmol/L (ref 3.5–5.1)
Sodium: 126 mmol/L — ABNORMAL LOW (ref 135–145)

## 2024-02-19 LAB — HEPATITIS B SURFACE ANTIBODY,QUALITATIVE: Hep B S Ab: REACTIVE — AB

## 2024-02-19 LAB — GLUCOSE, CAPILLARY
Glucose-Capillary: 58 mg/dL — ABNORMAL LOW (ref 70–99)
Glucose-Capillary: 87 mg/dL (ref 70–99)
Glucose-Capillary: 121 mg/dL — ABNORMAL HIGH (ref 70–99)
Glucose-Capillary: 49 mg/dL — ABNORMAL LOW (ref 70–99)
Glucose-Capillary: 99 mg/dL (ref 70–99)

## 2024-02-19 MED ORDER — DAPTOMYCIN-SODIUM CHLORIDE 500-0.9 MG/50ML-% IV SOLN
500.0000 mg | INTRAVENOUS | Status: DC
Start: 2024-02-19 — End: 2024-02-21
  Administered 2024-02-19: 500 mg via INTRAVENOUS
  Filled 2024-02-19: qty 50

## 2024-02-19 MED ORDER — CARMEX CLASSIC LIP BALM EX OINT
TOPICAL_OINTMENT | CUTANEOUS | Status: DC | PRN
  Filled 2024-02-19: qty 10

## 2024-02-19 MED ORDER — DAPTOMYCIN-SODIUM CHLORIDE 500-0.9 MG/50ML-% IV SOLN
500.0000 mg | INTRAVENOUS | Status: DC
  Filled 2024-02-19: qty 50

## 2024-02-19 MED ORDER — HEPARIN SODIUM (PORCINE) 1000 UNIT/ML IJ SOLN
INTRAMUSCULAR | Status: AC
  Filled 2024-02-19: qty 5

## 2024-02-19 NOTE — Procedures (Signed)
 Patient seen and examined on Hemodialysis. The procedure was supervised and I have made appropriate changes. BP 114/76   Pulse 65   Temp 97.7 F (36.5 C)   Resp (!) 0   Ht 4' 11 (1.499 m)   Wt 46.6 kg   SpO2 100%   BMI 20.75 kg/m   QB 400 mL/ min via TDC, UF goal 1L  Tolerating treatment without complaints at this time.   Almarie Bonine MD [facility] [phone] 11:44 AM

## 2024-02-19 NOTE — Progress Notes (Signed)
 [facility] Progress Note   Subjective:    Seen and examined patient at bedside.  Reports some L hip/ flank pain and dry lips.   Objective Vitals:   02/19/24 0933 02/19/24 1000 02/19/24 1030 02/19/24 1100  BP: 117/77 115/79 96/69 114/76  Pulse: (!) 59 65 66 65  Resp: 13 (!) 7 10 (!) 0  Temp:      TempSrc:      SpO2: 100% 100% 99% 100%  Weight:      Height:       Physical Exam General: Chronically ill, frail, nad  Heart: RRR Lungs: CTAB Abdomen: soft Extremities: no LE edema, B BKA, some necrotic appearing ulcerations on L hand Dialysis Access: TDC, some reddened skin on the dressing  Filed Weights   02/17/24 0819 02/17/24 1154 02/19/24 0834  Weight: 41.7 kg 43.9 kg 46.6 kg   No intake or output data in the 24 hours ending 02/19/24 1141   Additional Objective Labs: Basic Metabolic Panel: Recent Labs  Lab 02/14/24 0847 02/16/24 0712 02/17/24 0840 02/19/24 0852  NA 128* 128* 129* 126*  K 4.7 4.8 5.4* 4.5  CL 93* 94* 93* 91*  CO2 24 24 22 24   GLUCOSE 88 97 84 66*  BUN 38* 34* 48* 46*  CREATININE 3.11* 3.09* 3.60* 3.11*  CALCIUM  9.2 9.0 9.0 8.5*  PHOS 3.7 3.6 4.9*  --    Liver Function Tests: Recent Labs  Lab 02/14/24 0847 02/16/24 0712 02/17/24 0840  ALBUMIN  2.4* 2.2* 2.4*   No results for input(s): LIPASE, AMYLASE in the last 168 hours. CBC: Recent Labs  Lab 02/14/24 0846 02/16/24 0712 02/17/24 0840 02/19/24 0852  WBC 6.8 6.2 8.1 6.8  NEUTROABS 5.3  --   --  5.6  HGB 10.0* 10.8* 10.1* 9.9*  HCT 31.7* 33.9* 31.4* 30.0*  MCV 99.4 99.1 99.7 98.4  PLT 108* 95* 90* 82*   Blood Culture    Component Value Date/Time   SDES NEEDLE ASPIRATE 01/07/2024 1612   SPECREQUEST DISC 01/07/2024 1612   CULT  01/07/2024 1612    FEW METHICILLIN RESISTANT STAPHYLOCOCCUS AUREUS NO ANAEROBES ISOLATED Performed at [facility], [address] [place], [place] [address]    REPTSTATUS 01/12/2024 FINAL 01/07/2024 1612    Cardiac  Enzymes: Recent Labs  Lab 02/18/24 1835  CKTOTAL 35*   CBG: Recent Labs  Lab 02/18/24 1622 02/18/24 2032 02/18/24 2320 02/19/24 0406 02/19/24 0612  GLUCAP 64* 92 87 58* 87   Iron  Studies: No results for input(s): IRON , TIBC, TRANSFERRIN, FERRITIN in the last 72 hours. Lab Results  Component Value Date   INR 1.1 01/07/2024   INR 1.1 12/15/2023   INR 1.2 10/08/2023     Medications:  DAPTOmycin     dextrose  20 mL/hr at 02/18/24 1233    (feeding supplement) PROSource Plus  30 mL Oral Daily   acetaminophen   1,000 mg Oral TID   acyclovir ointment  1 Application Topical 6 X Daily   artificial tears   Right Eye QID   bictegravir-emtricitabine -tenofovir  AF  1 tablet Oral Daily   Chlorhexidine  Gluconate Cloth  6 each Topical Q0600   darbepoetin (ARANESP ) injection - DIALYSIS  150 mcg Subcutaneous Q Fri-1800   feeding supplement (NEPRO CARB STEADY)  237 mL Oral BID AC & HS   fentaNYL   1 patch Transdermal Q72H   gabapentin   100 mg Oral TID   gatifloxacin   1 drop Right Eye BID   heparin   5,000 Units Subcutaneous Q8H  lidocaine   2 patch Transdermal Q24H   methocarbamol   500 mg Oral TID   midodrine   10 mg Oral TID WC   midodrine   10 mg Oral Q T,Th,Sa-HD   multivitamin  1 tablet Oral QHS   pantoprazole   40 mg Oral BID AC   sevelamer  carbonate  800 mg Oral TID WC    Dialysis Orders: [facility] - MWF 3.5h  B400  45.5kg  TDC  Heparin  2000 Mircera 100 mcg - last dose 12/01/23 Calcitriol  1.0 mcg (on hold due to high Ca) Uses midodrine  10mg  TID + extra pre-HD   Assessment/Plan: L4-5 discitis/osteomyelitis/psoas abscess: Vanc/Cefepime  through 8/28, now Vanc 500mg  q HD alone until 02/18/24 per ID. S/p IR aspiration 8/27 - growing MRSA. Not a surgical candidate. Pain management per primary, unclear if pain will improve. Plan is repeat imaging in the near future. ESRD: Historically MWF schedule - on TTS this admit, prefers to stay on that schedule. Next HD 10/9.  Clean catheter with  alcohol  instead of chlorhexidine - looks like allergy. BP/volume: Chronic hypotension on mido 10mg  TID and extra pre-HD. Below EDW now.  Anemia of ESRD: Hgb 10.8 - continue Aranesp  150mcg q Friday. No IV iron  d/t infection. HIV: controlled on Biktarvy  BMD: CorrCa high, VDRA on hold. Phos on goal, on sevelamer  as binder.  Nutrition: Alb low, continue supps and diet has been liberalized. Debility: PT/OT as tolerated. Dispo: Very difficult placement. Noted patient is uninsured thus SNF is not an option.  No family or safe dispo plan at this time.  Almarie Bonine MD [facility] 02/19/2024,11:41 AM  LOS: 66 days

## 2024-02-19 NOTE — Progress Notes (Addendum)
 PROGRESS NOTE  Alvin Daniels  DOB: Sep 01, 1970  PCP: Pcp, No FMW:[id number]  DOA: 12/14/2023  LOS: 66 days  Hospital Day: 68  Subjective: Patient was seen and examined this morning. Afebrile, blood pressure 120s today Diarrhea seems to be improving. Blood sugar level low at 58 this morning  Brief narrative: Alvin Daniels is a 53 y.o. 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  TEE at that time 6/4 had shown EF 60-65%, 1.8 x 1.2 mass right atrial wall- vegetation from old HD catheter impinged on RA wall-versus mass/tumor. 8/3, presented to the hospital with right hip pain.   Imagings with CT abdomen/pelvis, MRI spine/pelvis showed L4-L5 osteomyelitis with psoas abscess.   Admitted to [facility] Orthopedics consulted Not deemed to be a surgical candidate. 8/27, underwent L4-L5 disc aspiration by IR.  Culture grew MRSA Per ID recommendation, currently on 6-week course of IV vancomycin , EOT 02/18/2024  10/7, repeat lumbar spine MRI showed significant worsening of findings as below 1. Progressive L4-L5 discitis osteomyelitis since late August. 2. Subtotal L4 vertebral body destruction now, with increased severe multifactorial L4 level spinal stenosis. 3. New multiloculated dorsal epidural abscess tracking up from L4 to the L1-L2 level, asymmetric to the right, and with associated new multilevel spinal stenosis and also involving the right L1, L2, and L3 neural foramina. 4. Additional new posterior right paraspinal abscesses at L4-L5, individually up to 1.5 cm. Right psoas muscle abscesses not significantly changed. 5. Severe multifactorial spinal canal stenosis at L4, progressed. Epidural abscess-related stenosis is mild at L1-L2, moderate at L2-L3, and severe at L3.  ID has been reengaged 10/9, I spoke  with neurosurgeon Dr. Joshua as well as Ortho Spine Dr. Georgina.  Assessment and plan: L4-L5 discitis / right psoas abscess  s/p L4/L5 disc aspiration by IR -8/27 - MRSA Patient with right hip pain in the setting of recent h/o MRSA bacteremia Also with recent left fourth finger osteomyelitis -09/2023 (strep/Bacteroides/E. coli on bone culture) As per prior notes,  Patient is not a surgical candidate. 8/27, underwent L4-L5 disc aspiration by IR.  Culture grew MRSA Per ID recommendation, currently on 6-week course of IV vancomycin , EOT 02/18/2024. Repeat MRI lumbar spine 10/7 showed significant findings as above.   ID has started the patient on IV daptomycin 10/9, I spoke with neurosurgeon Dr. Joshua as well as Ortho Spine Dr. Georgina.  To be decided between two specialties on who will be consulting on this patient. Pain control PRN Pain regimen --- Scheduled: Tylenol  1 g 3 times daily, fentanyl  50 mcg patch, Robaxin  3 times daily, Neurontin  100 mg 3 times daily --- PRN: Ibuprofen  every 8 hours, oxycodone  every 6 hours  Persistent diarrhea Recurrent hypoglycemic episodes 10/8, patient mentioned that he is having significant diarrhea and not eating in the fear of having more diarrhea.  This is probably leading to hypoglycemic episodes.  It seems patient was getting daily MiraLAX .  I stopped those and started on as needed Imodium  10/9, diarrhea is improving now Currently on D10 drip at 20 mL/h.  Once more stability stabilizes, plan to stop dextrose  drip. Dietitian consult appreciated Monitor blood sugar level Recent Labs  Lab 02/18/24 1622 02/18/24 2032 02/18/24 2320 02/19/24 0406 02/19/24 0612  GLUCAP 64* 92 87 58* 87   ESRD-HD-MWF Nephrology following.    Chronic hypotension Blood pressure is labile, fluctuates between 80s and 150s.   Currently  on midodrine  10 mg 3 times daily and additional 10 mg after dialysis. Maintain MAP > 65  Hyponatremia Sodium level low but stable Recent  Labs  Lab 02/14/24 0847 02/16/24 0712 02/17/24 0840 02/19/24 0852  NA 128* 128* 129* 126*   Anemia of ESRD Hemoglobin low but stable.   Continue Aranesp /iron  defer to nephrology service. Continue PPI Recent Labs    02/12/24 0417 02/14/24 0846 02/16/24 0712 02/17/24 0840 02/19/24 0852  HGB 10.2* 10.0* 10.8* 10.1* 9.9*  MCV 99.4 99.4 99.1 99.7 98.4   PAD-s/p prior bilateral BKA Normally uses prosthesis. Telling some staff that he 'hasn't walked in years' . Renal navigator seeking collateral information from dialysis center. Due to severe pain-He has been refusing to mobilize with PT  Note-has been refusing physical therapy-multiple times-they have signed off.   HIV  CD4 254 on 5/29 Continue Biktarvy .   Chronic HCV Outpatient follow-up with infectious disease.   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 Continue moxifloxacin eyedrops twice daily OD, ophthalmic lubricant 4 times daily OD.   Chronic intrahepatic /extrahepatic biliary dilatation Incidental finding Per radiology-similar to prior CTs.   Difficult disposition: Lives alone, has no friends, unclear if he has family - uninsured-SNF not a option per SW -history of ESRD with bilateral BKA -currently claims unable to ambulate with his prostheses, can't bring prostheses -hence unable to be discharged home safely.   Patient has been refusing PT/OT-multiple times-has resolved-PT/OT have signed off. Normally-prior to this hospitalization was able to ambulate with prosthesis and catch the bus/scat transport to go back and forth from dialysis center.    Mobility:  PT Orders:   PT Follow up Rec: Home Health Pt9/02/2024 1600   Goals of care   Code Status: Full Code  Palliative care consult requested     DVT prophylaxis:  heparin  injection 5,000 Units Start: 12/15/23 1400   Antimicrobials: IV Daptomycin, Biktarvy  Fluid: None Consultants: Nephrology,  ID Family Communication: None at bedside  Status: Inpatient Level of care:  Telemetry Medical   Patient is from: Home Needs to continue in-hospital care: Difficult disposition    Diet:  Diet Order             Diet regular Room service appropriate? Yes with Assist; Fluid consistency: Thin; Fluid restriction: 1200 mL Fluid  Diet effective now                   Scheduled Meds:  (feeding supplement) PROSource Plus  30 mL Oral Daily   acetaminophen   1,000 mg Oral TID   acyclovir ointment  1 Application Topical 6 X Daily   artificial tears   Right Eye QID   bictegravir-emtricitabine -tenofovir  AF  1 tablet Oral Daily   Chlorhexidine  Gluconate Cloth  6 each Topical Q0600   darbepoetin (ARANESP ) injection - DIALYSIS  150 mcg Subcutaneous Q Fri-1800   feeding supplement (NEPRO CARB STEADY)  237 mL Oral BID AC & HS   fentaNYL   1 patch Transdermal Q72H   gabapentin   100 mg Oral TID   gatifloxacin   1 drop Right Eye BID   heparin   5,000 Units Subcutaneous Q8H   lidocaine   2 patch Transdermal Q24H   methocarbamol   500 mg Oral TID   midodrine   10 mg Oral TID WC   midodrine   10 mg Oral Q T,Th,Sa-HD   multivitamin  1 tablet Oral QHS   pantoprazole   40 mg Oral BID AC   sevelamer  carbonate  800 mg Oral TID WC  PRN meds: dextrose , hydrALAZINE , ibuprofen , ipratropium-albuterol , lip balm, loperamide , metoprolol  tartrate, ondansetron  (ZOFRAN ) IV, oxyCODONE , sodium chloride  flush   Infusions:   DAPTOmycin     dextrose  20 mL/hr at 02/18/24 1233     Antimicrobials: Anti-infectives (From admission, onward)    Start     Dose/Rate Route Frequency Ordered Stop   02/19/24 1800  DAPTOmycin (CUBICIN) IVPB 500 mg/27mL premix        500 mg 100 mL/hr over 30 Minutes Intravenous Once per day on Tuesday Thursday Saturday 02/19/24 1004     02/18/24 2045  DAPTOmycin (CUBICIN) 400 mg in sodium chloride  0.9 % IVPB  Status:  Discontinued        400 mg 116 mL/hr over 30 Minutes Intravenous Every  48 hours 02/18/24 1946 02/19/24 1004   02/12/24 1800  vancomycin  (VANCOREADY) IVPB 500 mg/100 mL        500 mg 100 mL/hr over 60 Minutes Intravenous Every T-Th-Sa (Hemodialysis) 02/10/24 0958 02/17/24 1819   01/26/24 1800  bictegravir-emtricitabine -tenofovir  AF (BIKTARVY ) 50-200-25 MG per tablet 1 tablet        1 tablet Oral Daily 01/26/24 0853     01/06/24 1800  ceFEPIme  (MAXIPIME ) 2 g in sodium chloride  0.9 % 100 mL IVPB  Status:  Discontinued        2 g 200 mL/hr over 30 Minutes Intravenous Every T-Th-Sa (1800) 01/05/24 1131 01/08/24 1053   01/06/24 1200  vancomycin  (VANCOREADY) IVPB 500 mg/100 mL  Status:  Discontinued        500 mg 100 mL/hr over 60 Minutes Intravenous Every T-Th-Sa (Hemodialysis) 01/05/24 1130 02/10/24 0958   01/03/24 1200  vancomycin  (VANCOREADY) IVPB 500 mg/100 mL        500 mg 100 mL/hr over 60 Minutes Intravenous  Once 01/02/24 1052 01/03/24 1414   01/03/24 1200  ceFEPIme  (MAXIPIME ) 2 g in sodium chloride  0.9 % 100 mL IVPB        2 g 200 mL/hr over 30 Minutes Intravenous  Once 01/02/24 1212 01/03/24 1343   01/02/24 1200  ceFEPIme  (MAXIPIME ) 2 g in sodium chloride  0.9 % 100 mL IVPB  Status:  Discontinued        2 g 200 mL/hr over 30 Minutes Intravenous Every M-W-F (Hemodialysis) 12/31/23 1221 01/05/24 1131   01/02/24 1200  vancomycin  (VANCOREADY) IVPB 500 mg/100 mL  Status:  Discontinued        500 mg 100 mL/hr over 60 Minutes Intravenous Every M-W-F (Hemodialysis) 12/31/23 1221 01/05/24 1130   01/01/24 1200  vancomycin  (VANCOREADY) IVPB 500 mg/100 mL        500 mg 100 mL/hr over 60 Minutes Intravenous Every T-Th-Sa (Hemodialysis) 12/31/23 1219 01/01/24 1609   01/01/24 1200  ceFEPIme  (MAXIPIME ) 2 g in sodium chloride  0.9 % 100 mL IVPB        2 g 200 mL/hr over 30 Minutes Intravenous Once in dialysis 12/31/23 1219 01/01/24 1840   12/30/23 1400  vancomycin  (VANCOREADY) IVPB 500 mg/100 mL        500 mg 100 mL/hr over 60 Minutes Intravenous  Once 12/30/23 1310  12/30/23 2033   12/30/23 1400  ceFEPIme  (MAXIPIME ) 1 g in sodium chloride  0.9 % 100 mL IVPB        1 g 200 mL/hr over 30 Minutes Intravenous  Once 12/30/23 1310 12/30/23 2005   12/29/23 2230  ceFEPIme  (MAXIPIME ) 1 g in sodium chloride  0.9 % 100 mL IVPB        1 g 200 mL/hr over 30  Minutes Intravenous  Once 12/29/23 2131 12/30/23 0656   12/29/23 1200  vancomycin  (VANCOREADY) IVPB 500 mg/100 mL  Status:  Discontinued        500 mg 100 mL/hr over 60 Minutes Intravenous Every M-W-F (Hemodialysis) 12/25/23 1313 12/26/23 1056   12/29/23 1200  ceFEPIme  (MAXIPIME ) 2 g in sodium chloride  0.9 % 100 mL IVPB  Status:  Discontinued        2 g 200 mL/hr over 30 Minutes Intravenous Every M-W-F (Hemodialysis) 12/26/23 1056 12/31/23 1221   12/29/23 1200  vancomycin  (VANCOREADY) IVPB 500 mg/100 mL  Status:  Discontinued        500 mg 100 mL/hr over 60 Minutes Intravenous Every M-W-F (Hemodialysis) 12/26/23 1056 12/31/23 1221   12/27/23 1400  ceFEPIme  (MAXIPIME ) 2 g in sodium chloride  0.9 % 100 mL IVPB        2 g 200 mL/hr over 30 Minutes Intravenous  Once 12/27/23 1319 12/27/23 1443   12/27/23 1200  vancomycin  (VANCOREADY) IVPB 500 mg/100 mL  Status:  Discontinued        500 mg 100 mL/hr over 60 Minutes Intravenous Every Sat (Hemodialysis) 12/25/23 1313 12/26/23 1056   12/27/23 1200  ceFEPIme  (MAXIPIME ) 2 g in sodium chloride  0.9 % 100 mL IVPB  Status:  Discontinued        2 g 200 mL/hr over 30 Minutes Intravenous Every Sat (Hemodialysis) 12/26/23 1056 12/27/23 1318   12/27/23 1200  vancomycin  (VANCOREADY) IVPB 500 mg/100 mL        500 mg 100 mL/hr over 60 Minutes Intravenous Every Sat (Hemodialysis) 12/26/23 1056 12/27/23 1227   12/26/23 1700  ciprofloxacin  (CIPRO ) tablet 500 mg        500 mg Oral  Once 12/26/23 1056 12/26/23 1704   12/25/23 1915  ciprofloxacin  (CIPRO ) tablet 500 mg        500 mg Oral  Once 12/25/23 1819 12/25/23 1849   12/25/23 1800  ceFEPIme  (MAXIPIME ) 1 g in sodium chloride  0.9 %  100 mL IVPB  Status:  Discontinued        1 g 200 mL/hr over 30 Minutes Intravenous Every 24 hours 12/25/23 1316 12/26/23 1056   12/25/23 1200  ceFEPIme  (MAXIPIME ) 2 g in sodium chloride  0.9 % 100 mL IVPB  Status:  Discontinued        2 g 200 mL/hr over 30 Minutes Intravenous Once in dialysis 12/24/23 1739 12/25/23 1316   12/25/23 1200  vancomycin  (VANCOCIN ) IVPB 1000 mg/200 mL premix  Status:  Discontinued        1,000 mg 200 mL/hr over 60 Minutes Intravenous  Once 12/24/23 1739 12/24/23 1809   12/25/23 1200  vancomycin  (VANCOREADY) IVPB 500 mg/100 mL  Status:  Discontinued        500 mg 100 mL/hr over 60 Minutes Intravenous  Once 12/24/23 1809 12/25/23 1313   12/24/23 1200  ceFEPIme  (MAXIPIME ) 2 g in sodium chloride  0.9 % 100 mL IVPB  Status:  Discontinued        2 g 200 mL/hr over 30 Minutes Intravenous Every M-W-F (Hemodialysis) 12/22/23 0851 12/25/23 1316   12/19/23 0000  ceFEPime  (MAXIPIME ) IVPB        2 g Intravenous Every M-W-F (Hemodialysis) 12/19/23 1300 02/09/24 2359   12/19/23 0000  vancomycin  IVPB        500 mg Intravenous Every M-W-F (Hemodialysis) 12/19/23 1300 02/09/24 2359   12/16/23 2200  ceFEPIme  (MAXIPIME ) 1 g in sodium chloride  0.9 % 100 mL IVPB  1 g 200 mL/hr over 30 Minutes Intravenous Every 24 hours 12/15/23 1348 12/22/23 2203   12/15/23 2230  vancomycin  (VANCOREADY) IVPB 500 mg/100 mL  Status:  Discontinued        500 mg 100 mL/hr over 60 Minutes Intravenous Every M-W-F (Hemodialysis) 12/15/23 2217 12/25/23 1313   12/15/23 1400  bictegravir-emtricitabine -tenofovir  AF (BIKTARVY ) 50-200-25 MG per tablet 1 tablet  Status:  Discontinued        1 tablet Oral Daily 12/15/23 1307 01/26/24 0853   12/15/23 1347  vancomycin  variable dose per unstable renal function (pharmacist dosing)  Status:  Discontinued         Does not apply See admin instructions 12/15/23 1348 12/15/23 2218   12/15/23 1100  ceFEPIme  (MAXIPIME ) 2 g in sodium chloride  0.9 % 100 mL IVPB        2  g 200 mL/hr over 30 Minutes Intravenous  Once 12/15/23 1048 12/15/23 1130   12/15/23 1100  vancomycin  (VANCOCIN ) IVPB 1000 mg/200 mL premix        1,000 mg 200 mL/hr over 60 Minutes Intravenous  Once 12/15/23 1048 12/15/23 1154       Objective: Vitals:   02/19/24 1249 02/19/24 1251  BP: (!) 84/52 103/64  Pulse: 66 69  Resp: 11 12  Temp: (!) 96.3 F (35.7 C) (!) 96.3 F (35.7 C)  SpO2: 100%     Intake/Output Summary (Last 24 hours) at 02/19/2024 1333 Last data filed at 02/19/2024 1251 Gross per 24 hour  Intake --  Output 0 ml  Net 0 ml    Filed Weights   02/17/24 0819 02/17/24 1154 02/19/24 0834  Weight: 41.7 kg 43.9 kg 46.6 kg   Weight change:  Body mass index is 20.75 kg/m.   Physical Exam: General exam: Pleasant, middle-aged Hispanic male. Looks older for his age Skin: No rashes, lesions or ulcers. HEENT: Atraumatic, normocephalic, no obvious bleeding Lungs: Clear to auscultation bilaterally,  CVS: S1, S2, no murmur,   GI/Abd: Soft, nontender, nondistended, bowel sound present,   CNS: Alert, awake, blind Psychiatry: Sad affect Extremities: Prior bilateral BKA status  Data Review: I have personally reviewed the laboratory data and studies available.  F/u labs  Unresulted Labs (From admission, onward)     Start     Ordered   02/25/24 0500  CK  Weekly,   R     Question:  Specimen collection method  Answer:  Lab=Lab collect   02/18/24 1813   02/19/24 0848  Hepatitis B surface antibody,qualitative  Once,   R       Question:  Specimen collection method  Answer:  Lab=Lab collect   02/19/24 0848   02/19/24 0847  Hepatitis B e antigen  Once,   R       Question:  Specimen collection method  Answer:  Lab=Lab collect   02/19/24 0848            Signed, Chapman Rota, MD [facility] 02/19/2024

## 2024-02-19 NOTE — Consult Note (Addendum)
 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 male.   HPI:  Unfortunate 53 year old male with multiple medical problems including chronic hep C, HIV infection, osteomyelitis, end-stage renal disease on dialysis and BKA bilaterally was admitted 2 months ago with vertebral osteomyelitis and discitis.  The notes in the chart certainly say that orthopedics was consulted at the time and the patient was felt to not need surgery.  He has been on 6 weeks of IV antibiotics with vancomycin  followed by daptomycin.  He complains of severe bilateral hip pain.  He is immobile.  None of this is new.  He had diarrhea the last couple of days.  A new MRI was not done because he was worsening in someway but for routine follow-up.  Despite the previous orthopedic evaluation, neurosurgical evaluation was requested today based on the follow-up MRI showing continued osteomyelitis, right psoas abscess and an epidural process consistent with phlegmon or abscess.  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  Past Medical History:  Diagnosis Date   Acute bilateral low back pain 01/03/2021   AIDS ([name]) 11/22/2014   Back pain 06/09/2019   BKA stump complication ([name]) 06/09/2019   Chronic diarrhea    Chronic hepatitis C without hepatic coma ([name]) 11/22/2014   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[name])    Right   Corneal ulcer 12/18/2022   Descemetocele of left eye 12/18/2022   Diabetic neuropathy ([name])    ESRD (end stage renal disease) on dialysis ([name])    TTS; don't remember street name (05/03/2014)   Hepatitis C    HIV INFECTION 06/27/2010   Qualifier: Diagnosis of  By: Renae CMA ( AAMA), Jacqueline     Hypotension 06/02/2012   Metabolic bone disease 06/18/2012   MRSA infection    Normocytic anemia 06/17/2012   Pancreatitis    Pressure ulcer of BKA stump, stage 2 ([name])  11/22/2014   Severe protein-calorie malnutrition 06/17/2012   Uncontrolled diabetes mellitus with complications 06/27/2010   Annotation: uncontrolled Qualifier: Diagnosis of  By: Renae CMA ( AAMA), Jacqueline     Vaccine counseling 05/29/2022    Past Surgical History:  Procedure Laterality Date   AMPUTATION Left 04/20/2014   Procedure: 3rd toe amputation, 4th Toe Amputation,  5th Toe Amputation;  Surgeon: Jerona Harden GAILS, MD;  Location: [facility] OR;  Service: Orthopedics;  Laterality: Left;   AMPUTATION Left 05/02/2014   Procedure: Midfoot Amputation;  Surgeon: Jerona Harden GAILS, MD;  Location: [facility] OR;  Service: Orthopedics;  Laterality: Left;   AMPUTATION Left 06/17/2014   Procedure: AMPUTATION BELOW KNEE;  Surgeon: Jerona Harden GAILS, MD;  Location: [facility] OR;  Service: Orthopedics;  Laterality: Left;   AMPUTATION Right 09/04/2018   Procedure: RIGHT BELOW KNEE AMPUTATION;  Surgeon: Harden Jerona GAILS, MD;  Location: [facility] OR;  Service: Orthopedics;  Laterality: Right;  RIGHT BELOW KNEE AMPUTATION   AMPUTATION FINGER Left 10/09/2023   Procedure: AMPUTATION, FINGER;  Surgeon: Romona Harari, MD;  Location: [facility] OR;  Service: Orthopedics;  Laterality: Left;  Left Ring Finger amputation   AV FISTULA PLACEMENT Left    AV FISTULA PLACEMENT Left 05/10/2016   Procedure: Creation Left Arm Brachiocephalic Arteriovenous Fistula and Ligation of Radiocephalic Fistula;  Surgeon: Lonni GORMAN Blade, MD;  Location: [facility] OR;  Service: Vascular;  Laterality: Left;   DIALYSIS/PERMA CATHETER INSERTION N/A 09/01/2023   Procedure: DIALYSIS/PERMA CATHETER INSERTION;  Surgeon: Melia Lynwood ORN, MD;  Location: [facility] INVASIVE CV LAB;  Service: Cardiovascular;  Laterality: N/A;   FEMUR IM NAIL Left 08/17/2016   Procedure: INTRAMEDULLARY (IM) RETROGRADE FEMORAL NAILING;  Surgeon: Oneil JAYSON Herald, MD;  Location: [facility] OR;  Service: Orthopedics;  Laterality: Left;   FOOT AMPUTATION THROUGH ANKLE Left 12/'21/2015   midfoot   IR AV DIALY SHUNT INTRO  NEEDLE/INTRACATH INITIAL W/PTA/IMG LEFT  04/17/2023   IR FLUORO GUIDE CV LINE RIGHT  10/14/2023   IR FLUORO GUIDE CV LINE RIGHT  11/11/2023   IR GENERIC HISTORICAL Left 05/01/2016   IR THROMBECTOMY AV FISTULA W/THROMBOLYSIS/PTA INC/SHUNT/IMG LEFT 05/01/2016 Toribio Faes, MD [facility]   IR GENERIC HISTORICAL  05/01/2016   IR US  GUIDE VASC ACCESS LEFT 05/01/2016 Toribio Faes, MD [facility]   IR GENERIC HISTORICAL  05/07/2016   IR FLUORO GUIDE CV LINE RIGHT 05/07/2016 Ami Bellman, DO [facility]   IR GENERIC HISTORICAL  05/07/2016   IR US  GUIDE VASC ACCESS RIGHT 05/07/2016 Ami Bellman, DO [facility]   IR GENERIC HISTORICAL  05/22/2016   IR US  GUIDE VASC ACCESS RIGHT 05/22/2016 Ozell Specking, MD [facility]   IR GENERIC HISTORICAL  05/22/2016   IR FLUORO GUIDE CV LINE RIGHT 05/22/2016 Ozell Specking, MD [facility]   IR LUMBAR DISC ASPIRATION W/IMG GUIDE  01/07/2024   IR REMOVAL TUN CV CATH W/O FL  08/21/2016   IR REMOVAL TUN CV CATH W/O FL  10/12/2023   IR US  GUIDE VASC ACCESS LEFT  04/17/2023   IR US  GUIDE VASC ACCESS RIGHT  10/14/2023   PERIPHERAL VASCULAR CATHETERIZATION Left 05/09/2016   Procedure: A/V Fistulagram;  Surgeon: Lonni GORMAN Blade, MD;  Location: [facility] INVASIVE CV LAB;  Service: Cardiovascular;  Laterality: Left;  arm   TEE WITHOUT CARDIOVERSION N/A 06/22/2018   Procedure: TRANSESOPHAGEAL ECHOCARDIOGRAM (TEE);  Surgeon: Jeffrie Oneil JAYSON, MD;  Location: [facility] ENDOSCOPY;  Service: Cardiovascular;  Laterality: N/A;   TRANSESOPHAGEAL ECHOCARDIOGRAM (CATH LAB) N/A 10/15/2023   Procedure: TRANSESOPHAGEAL ECHOCARDIOGRAM;  Surgeon: Rolan Ezra GORMAN, MD;  Location: [facility] INVASIVE CV LAB;  Service: Cardiovascular;  Laterality: N/A;    No Known Allergies  Social History   Tobacco Use   Smoking status: Never   Smokeless tobacco: Never  Substance Use Topics   Alcohol  use: No    Family History  Problem Relation Age of Onset   Diabetes Mother    Diabetes Father      Review of  Systems  Positive ROS: Unable to fully obtain  All other systems have been reviewed and were otherwise negative with the exception of those mentioned in the HPI and as above.  Objective: Vital signs in last 24 hours: Temp:  [96.3 F (35.7 C)-97.7 F (36.5 C)] 96.3 F (35.7 C) (10/09 1251) Pulse Rate:  [59-69] 69 (10/09 1251) Resp:  [0-18] 12 (10/09 1251) BP: (84-125)/(42-81) 103/64 (10/09 1251) SpO2:  [93 %-100 %] 100 % (10/09 1249) Weight:  [46.6 kg] 46.6 kg (10/09 0834)  General Appearance: Alert, cooperative, no distress, he is hectic and chronically ill-appearing and appears much older than stated age.  He is certainly nontoxic-appearing Head: Normocephalic, without obvious abnormality, atraumatic Eyes:     Throat: benign, poor dentition Neck: Supple, symmetrical, trachea midline Back: Symmetric, no curvature, ROM normal, no CVA tenderness Lungs: respirations unlabored Heart: Regular rate and rhythm Abdomen: Soft, non-tender Extremities: Bilateral BKA   NEUROLOGIC:   Mental status: A&O x4, no aphasia, good attention span, Memory and fund of knowledge appear to be appropriate as best  I can tell Motor Exam - grossly normal, normal tone and bulk in the upper extremities with some movement in the proximal stumps of the lower extremities Sensory Exam - grossly normal Reflexes: Unable to test in the legs Coordination - grossly normal in the arm Gait -unable to test Balance -unable to test Cranial Nerves: I: smell Not tested  II: visual acuity  OS: na  OD: na     II: pupils   III,VII: ptosis None  III,IV,VI: extraocular muscles  Full ROM  V: mastication Normal  V: facial light touch sensation  Normal  V,VII: corneal reflex  Present  VII: facial muscle function - upper  Normal  VII: facial muscle function - lower Normal  VIII: hearing Not tested  IX: soft palate elevation  Normal  IX,X: gag reflex Present  XI: trapezius strength  5/5  XI: sternocleidomastoid  strength 5/5  XI: neck flexion strength  5/5  XII: tongue strength  Normal    Data Review Lab Results  Component Value Date   WBC 6.8 02/19/2024   HGB 9.9 (L) 02/19/2024   HCT 30.0 (L) 02/19/2024   MCV 98.4 02/19/2024   PLT 82 (L) 02/19/2024   Lab Results  Component Value Date   NA 126 (L) 02/19/2024   K 4.5 02/19/2024   CL 91 (L) 02/19/2024   CO2 24 02/19/2024   BUN 46 (H) 02/19/2024   CREATININE 3.11 (H) 02/19/2024   GLUCOSE 66 (L) 02/19/2024   Lab Results  Component Value Date   INR 1.1 01/07/2024    Radiology: MR SACRUM SI JOINTS W WO CONTRAST Result Date: 12/15/2023 CLINICAL DATA:  Septic arthritis suspected, hip, xray done Right hip pain. EXAM: MRI PELVIS WITHOUT AND WITH CONTRAST TECHNIQUE: Multiplanar multisequence MR imaging of the pelvis with attention to the sacroiliac joints was performed both before and after administration of intravenous contrast. CONTRAST:  5mL GADAVIST  GADOBUTROL  1 MMOL/ML IV SOLN COMPARISON:  Right hip radiographs 12/14/2023. MRI of the sacrum 10/15/2023 and MRI of the right hip 10/13/2023. FINDINGS: Bones/Joint/Cartilage No evidence of sacroiliitis, osteomyelitis or acute fracture within the visualized bony pelvis. However, there is new T2 hyperintensity and heterogeneous enhancement associated with the L4-5 disc and inferior endplate of L4, concerning for discitis and osteomyelitis. Paraspinal inflammatory changes are further described below. Mild degenerative changes at the hips and sacroiliac joints without significant joint effusions. Artifact from left femoral intramedullary nail noted. Ligaments No significant ligamentous abnormalities are identified. Muscles and Tendons Heterogeneous enhancement within the psoas muscles bilaterally with a peripherally enhancing fluid collection on the right measuring up to 2.2 x 1.2 x 4.1 cm, consistent with an abscess. No significant pelvic muscular abnormalities are identified. Soft tissue As above, findings  suspicious for discitis and osteomyelitis at L4-5. There is heterogeneous paraspinal enhancement with extension into the psoas musculature as described above. No abscess or other focal fluid collection identified within the pelvis. IMPRESSION: 1. Findings are consistent with discitis and osteomyelitis at L4-5 with associated paraspinal inflammatory changes and right psoas abscess. Recommend dedicated lumbar MRI without and with contrast. 2. No evidence of sacroiliitis, osteomyelitis or septic arthritis within the pelvis. 3. Mild degenerative changes at the hips and sacroiliac joints. Electronically Signed   By: Elsie Perone M.D.   On: 12/15/2023 10:36   DG Hip Unilat W or Wo Pelvis 2-3 Views Right Result Date: 12/14/2023 CLINICAL DATA:  Right hip pain. EXAM: DG HIP (WITH OR WITHOUT PELVIS) 2-3V RIGHT COMPARISON:  None Available. FINDINGS: There is  no evidence of acute hip fracture or dislocation. A radiopaque intramedullary rod and associated fixation screw are seen within the proximal left femoral shaft. There is no evidence of arthropathy or other focal bone abnormality. Marked severity vascular calcification is noted. IMPRESSION: 1. No acute findings. 2. Prior ORIF of the left femur. Electronically Signed   By: Suzen Dials M.D.   On: 12/14/2023 23:29   His new MRI was reviewed as was the report.  Assessment/Plan: Estimated body mass index is 20.75 kg/m as calculated from the following:   Height as of this encounter: 4' 11 (1.499 m).   Weight as of this encounter: 46.6 kg.   I have spoken to the hospitalist.  I have spoken to one of my partners about this case and gone over the imaging with him.  I have read the infectious disease note that states he definitely needs surgery.    This imaging was not done because of some type of clinical worsening but was done for routine follow-up.  The imaging does look worse compared to 6 weeks ago, and I think there is an epidural process consistent  with abscess or phlegmon but that is coming from the psoas process.  You can actually see it coming through the foramen into the canal in the upper lumbar level.  Therefore, I am not sure what the goal of surgery would be.  I have spoken to one of my partners and he is unsure what the goal of the surgery would be.  What would it actually accomplish?  Goals could include washing away bacteria (we wouldn't be able to reach but a smaller percentage of his overall dz), decompressing the canal to help pain (possible) or improve neurologic function (not likely at this point and given preoperative morbidity), or to stabilize an unstable situation (he could not tolerate the surgery it would require to do this).   Certainly the goal could include reducing bacterial load to help antibiotics work better.  This is felt to be a failure of antibiotics.  I am not sure that removing about 10% of the pus he has associated with this disease with a laminectomy would help the antibiotics work better or change a failure of antibiotics.  Most of the abscesses and infectious process is actually located in the paraspinal tissues and psoas, and I would not have access to the psoas musculature.  It is unclear to me how this is a failure of antibiotics if his sed rate has reduced to 20 and his CRP has reduced to 5 as this would suggest a response to antibiotic therapy?  His MRIs are 6 weeks apart and we do not know when during that 6 weeks this worsening occurred.  This certainly could be epidural phlegmon that is 80 to 19 weeks old and not liquid pus. I doubt these new findings occurred yesterday.   The process is mostly in the right psoas muscle, and as soon as I removed the pus from the epidural space it will likely come back through the foramen if the psoas is not drained and treated.  therefore I am not sure that the stated goal of surgery to reduce bacterial burden and improve antibiotic response or effectiveness would be  realized.  He certainly does not look like a good surgical candidate from an overall health standpoint.  I do not believe we are going to save ambulation with a decompression.  I am not sure we will overly help his pain syndrome given the  osteomyelitis and discitis and psoas abscess and hip disease that would not be treated with laminectomy.  Given all of this, I am not sure that he definitely needs surgery.  In fact, I would lean away from surgery and would consider an interventional radiology consult to try to drain as much of the abscess out of the right psoas as possible.  And then allow antibiotics to hopefully work better once the primary source of pus is drained.  I think the decision making is difficult and it certainly is easy to say he needs surgery, but looking at the entire picture I am not sure that surgery will accomplish very much and my partners seem to agree.  When he does need surgery, there will likely be a multilevel decompression and instrumented fusion to address spinal deformity from L4 vertebral body destruction and disc base destruction, but he is certainly not a candidate for that at this point and I am not sure from an overall general health standpoint he will ever be a candidate for that surgery.  I suspect he may not be ambulatory again and he will likely always have pain from this whether he has surgery or not.  I still think orthopedics was first consulted based on all the notes in the chart, and the decision making is quite difficult, and you could consider a second opinion from orthopedic spine.   Alm GORMAN Molt 02/19/2024 3:19 PM

## 2024-02-19 NOTE — Progress Notes (Signed)
 0 Ultrafiltration, condition stable post hemodialysis, report was given to the primary RN, and no feces noted.

## 2024-02-19 NOTE — Plan of Care (Signed)
 Problem: Education: Goal: Knowledge of General Education information will improve Description: Including pain rating scale, medication(s)/side effects and non-pharmacologic comfort measures 02/19/2024 0725 by Arnell Wyline RAMAN, RN Outcome: Progressing 02/19/2024 0724 by Arnell Wyline RAMAN, RN Outcome: Progressing   Problem: Health Behavior/Discharge Planning: Goal: Ability to manage health-related needs will improve 02/19/2024 0725 by Arnell Wyline RAMAN, RN Outcome: Progressing 02/19/2024 0724 by Arnell Wyline RAMAN, RN Outcome: Progressing   Problem: Clinical Measurements: Goal: Ability to maintain clinical measurements within normal limits will improve 02/19/2024 0725 by Arnell Wyline RAMAN, RN Outcome: Progressing 02/19/2024 0724 by Arnell Wyline RAMAN, RN Outcome: Progressing Goal: Will remain free from infection 02/19/2024 0725 by Arnell Wyline RAMAN, RN Outcome: Progressing 02/19/2024 0724 by Arnell Wyline RAMAN, RN Outcome: Progressing Goal: Diagnostic test results will improve 02/19/2024 0725 by Arnell Wyline RAMAN, RN Outcome: Progressing 02/19/2024 0724 by Arnell Wyline RAMAN, RN Outcome: Progressing Goal: Respiratory complications will improve 02/19/2024 0725 by Arnell Wyline RAMAN, RN Outcome: Progressing 02/19/2024 0724 by Arnell Wyline RAMAN, RN Outcome: Progressing Goal: Cardiovascular complication will be avoided 02/19/2024 0725 by Arnell Wyline RAMAN, RN Outcome: Progressing 02/19/2024 0724 by Arnell Wyline RAMAN, RN Outcome: Progressing   Problem: Activity: Goal: Risk for activity intolerance will decrease 02/19/2024 0725 by Arnell Wyline RAMAN, RN Outcome: Progressing 02/19/2024 0724 by Arnell Wyline RAMAN, RN Outcome: Progressing   Problem: Nutrition: Goal: Adequate nutrition will be maintained 02/19/2024 0725 by Arnell Wyline RAMAN, RN Outcome: Progressing 02/19/2024 0724 by Arnell Wyline RAMAN, RN Outcome: Progressing   Problem: Coping: Goal: Level of anxiety will  decrease 02/19/2024 0725 by Arnell Wyline RAMAN, RN Outcome: Progressing 02/19/2024 0724 by Arnell Wyline RAMAN, RN Outcome: Progressing   Problem: Elimination: Goal: Will not experience complications related to bowel motility 02/19/2024 0725 by Arnell Wyline RAMAN, RN Outcome: Progressing 02/19/2024 0724 by Arnell Wyline RAMAN, RN Outcome: Progressing Goal: Will not experience complications related to urinary retention 02/19/2024 0725 by Arnell Wyline RAMAN, RN Outcome: Progressing 02/19/2024 0724 by Arnell Wyline RAMAN, RN Outcome: Progressing   Problem: Pain Managment: Goal: General experience of comfort will improve and/or be controlled 02/19/2024 0725 by Arnell Wyline RAMAN, RN Outcome: Progressing 02/19/2024 0724 by Arnell Wyline RAMAN, RN Outcome: Progressing   Problem: Safety: Goal: Ability to remain free from injury will improve 02/19/2024 0725 by Arnell Wyline RAMAN, RN Outcome: Progressing 02/19/2024 0724 by Arnell Wyline RAMAN, RN Outcome: Progressing   Problem: Skin Integrity: Goal: Risk for impaired skin integrity will decrease 02/19/2024 0725 by Arnell Wyline RAMAN, RN Outcome: Progressing 02/19/2024 0724 by Arnell Wyline RAMAN, RN Outcome: Progressing   Problem: Education: Goal: Ability to describe self-care measures that may prevent or decrease complications (Diabetes Survival Skills Education) will improve 02/19/2024 0725 by Arnell Wyline RAMAN, RN Outcome: Progressing 02/19/2024 0724 by Arnell Wyline RAMAN, RN Outcome: Progressing Goal: Individualized Educational Video(s) 02/19/2024 0725 by Arnell Wyline RAMAN, RN Outcome: Progressing 02/19/2024 0724 by Arnell Wyline RAMAN, RN Outcome: Progressing   Problem: Coping: Goal: Ability to adjust to condition or change in health will improve 02/19/2024 0725 by Arnell Wyline RAMAN, RN Outcome: Progressing 02/19/2024 0724 by Arnell Wyline RAMAN, RN Outcome: Progressing   Problem: Fluid Volume: Goal: Ability to maintain a balanced  intake and output will improve 02/19/2024 0725 by Arnell Wyline RAMAN, RN Outcome: Progressing 02/19/2024 0724 by Arnell Wyline RAMAN, RN Outcome: Progressing   Problem: Health Behavior/Discharge Planning: Goal: Ability to identify and utilize available resources and services will improve 02/19/2024 0725 by Arnell Wyline RAMAN, RN Outcome: Progressing 02/19/2024 0724 by  Arnell Wyline RAMAN, RN Outcome: Progressing Goal: Ability to manage health-related needs will improve 02/19/2024 0725 by Arnell Wyline RAMAN, RN Outcome: Progressing 02/19/2024 0724 by Arnell Wyline RAMAN, RN Outcome: Progressing   Problem: Metabolic: Goal: Ability to maintain appropriate glucose levels will improve 02/19/2024 0725 by Arnell Wyline RAMAN, RN Outcome: Progressing 02/19/2024 0724 by Arnell Wyline RAMAN, RN Outcome: Progressing   Problem: Nutritional: Goal: Maintenance of adequate nutrition will improve 02/19/2024 0725 by Arnell Wyline RAMAN, RN Outcome: Progressing 02/19/2024 0724 by Arnell Wyline RAMAN, RN Outcome: Progressing Goal: Progress toward achieving an optimal weight will improve 02/19/2024 0725 by Arnell Wyline RAMAN, RN Outcome: Progressing 02/19/2024 0724 by Arnell Wyline RAMAN, RN Outcome: Progressing   Problem: Skin Integrity: Goal: Risk for impaired skin integrity will decrease 02/19/2024 0725 by Arnell Wyline RAMAN, RN Outcome: Progressing 02/19/2024 0724 by Arnell Wyline RAMAN, RN Outcome: Progressing   Problem: Tissue Perfusion: Goal: Adequacy of tissue perfusion will improve 02/19/2024 0725 by Arnell Wyline RAMAN, RN Outcome: Progressing 02/19/2024 0724 by Arnell Wyline RAMAN, RN Outcome: Progressing

## 2024-02-19 NOTE — Progress Notes (Signed)
 [facility]  Date of Admission:  12/14/2023      ASSESSMENT: New multiloculated dorsal epidural abscess. Psoas abscesses. Progressive L4-L5 discitis/osteomyelitis since August 2025 in spite of being on antibiotics. Additional new posterior right paraspinal abscesses at L4-L5.SABRA Patient was on prolonged course of IV vancomycin . Patient continues to be symptomatic with severe back pain and so antibiotics have been switched from IV vancomycin  to daptomycin.   PLAN: Continue IV daptomycin, dose as per pharmacy. Neurosurgery consult appreciated. I have seen quite a few failure cases of IR drainage especially in patients with multiple abscesses/multiloculated abscesses. If feasible, consider evaluation by orthopedic spinal surgery. If surgical intervention cannot be done, then consider IR evaluation and as I have stated before, there will be still high incidence of failure rate by IR drainage unless they can completely evacuate the psoas abscess. Patient being HIV and as well as end-stage renal disease on hemodialysis is immunosuppressed. Continue Biktarvy . Will monitor CK levels as well as for shortness of breath. Continue Zovirax topical application for the cold sore.  The acute phase reactants might be trending down but clinically patient has severe pain in the lower back area.  Since patient is symptomatic and the radiologic findings also shows progressive changes, I had recommended possible surgical intervention.  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)   Severe protein-calorie malnutrition   Normocytic anemia   ESRD on hemodialysis ([name])   S/P bilateral BKA (below knee amputation) ([name])   Chronic hepatitis C without hepatic coma ([name])   HIV (human immunodeficiency virus infection) ([name])   Hx MRSA infection   Acute osteomyelitis of lumbar spine ([name])   Epidural  abscess   Paraspinal abscess ([name])   Pyogenic inflammation of bone ([name])    (feeding supplement) PROSource Plus  30 mL Oral Daily   acetaminophen   1,000 mg Oral TID   acyclovir ointment  1 Application Topical 6 X Daily   artificial tears   Right Eye QID   bictegravir-emtricitabine -tenofovir  AF  1 tablet Oral Daily   Chlorhexidine  Gluconate Cloth  6 each Topical Q0600   darbepoetin (ARANESP ) injection - DIALYSIS  150 mcg Subcutaneous Q Fri-1800   feeding supplement (NEPRO CARB STEADY)  237 mL Oral BID AC & HS   fentaNYL   1 patch Transdermal Q72H   gabapentin   100 mg Oral TID   gatifloxacin   1 drop Right Eye BID   heparin   5,000 Units Subcutaneous Q8H   lidocaine   2 patch Transdermal Q24H   methocarbamol   500 mg Oral TID   midodrine   10 mg Oral TID WC   midodrine   10 mg Oral Q T,Th,Sa-HD   multivitamin  1 tablet Oral QHS   pantoprazole   40 mg Oral BID AC   sevelamer  carbonate  800 mg Oral TID WC    SUBJECTIVE: Patient continues to complain of low back pain.  Review of Systems: Review of Systems  Constitutional:  Positive for malaise/fatigue.  HENT: Negative.    Eyes:  Positive for blurred vision.  Respiratory: Negative.    Cardiovascular: Negative.   Gastrointestinal: Negative.   Genitourinary: Negative.   Musculoskeletal:  Positive for back pain.  Skin:  Positive for rash.  Neurological:  Positive for weakness.  Psychiatric/Behavioral: Negative.      No Known Allergies  OBJECTIVE: Vitals:   02/19/24 1200 02/19/24 1249 02/19/24 1251 02/19/24 1546  BP: 105/62 ROLLEN)  84/52 103/64 115/77  Pulse: 67 66 69 69  Resp: 14 11 12 16   Temp:  (!) 96.3 F (35.7 C) (!) 96.3 F (35.7 C)   TempSrc:      SpO2: 100% 100%  96%  Weight:      Height:       Body mass index is 20.75 kg/m.  Physical Exam Constitutional:      Appearance: He is ill-appearing.  HENT:     Head: Normocephalic and atraumatic.     Right Ear: External ear normal.     Left Ear: External ear normal.      Mouth/Throat:     Comments: Poor oral hygiene. Eyes:     Comments: Left eye corneal haziness present.  Cardiovascular:     Rate and Rhythm: Normal rate and regular rhythm.  Pulmonary:     Effort: Pulmonary effort is normal.     Breath sounds: Normal breath sounds.  Abdominal:     General: Bowel sounds are normal.     Palpations: Abdomen is soft.  Musculoskeletal:        General: Tenderness present.     Cervical back: Normal range of motion.     Comments: Tenderness present on palpation of the lower back area.  Skin:    General: Skin is warm and dry.  Neurological:     Mental Status: He is alert and oriented to person, place, and time. Mental status is at baseline.  Psychiatric:        Mood and Affect: Mood normal.        Behavior: Behavior normal.        Thought Content: Thought content normal.        Judgment: Judgment normal.     Lab Results Lab Results  Component Value Date   WBC 6.8 02/19/2024   HGB 9.9 (L) 02/19/2024   HCT 30.0 (L) 02/19/2024   MCV 98.4 02/19/2024   PLT 82 (L) 02/19/2024    Lab Results  Component Value Date   CREATININE 3.11 (H) 02/19/2024   BUN 46 (H) 02/19/2024   NA 126 (L) 02/19/2024   K 4.5 02/19/2024   CL 91 (L) 02/19/2024   CO2 24 02/19/2024    Lab Results  Component Value Date   ALT 9 12/15/2023   AST 19 12/15/2023   ALKPHOS 74 12/15/2023   BILITOT 1.8 (H) 12/15/2023     Microbiology: No results found for this or any previous visit (from the past 240 hours).  Dr. Zoanne, MD [facility] for Infectious Disease [facility] Cell phone: [phone].  @TODAY @ 5:16 PM   Total Encounter Time: 50 minutes.

## 2024-02-19 NOTE — Plan of Care (Signed)

## 2024-02-19 NOTE — Progress Notes (Signed)
 PHARMACY NOTE:  ANTIMICROBIAL RENAL DOSAGE ADJUSTMENT  Current antimicrobial regimen includes a mismatch between antimicrobial dosage and estimated renal function.  As per policy approved by the Pharmacy & Therapeutics and Medical Executive Committees, the antimicrobial dosage will be adjusted accordingly.  Current antimicrobial dosage:  daptomycin IV 400 mg q48h   Indication: MRSA vertebral infection with epidural abscess   Renal Function:  Estimated Creatinine Clearance: 18.1 mL/min (A) (by C-G formula based on SCr of 3.11 mg/dL (H)). [x]      On intermittent HD, scheduled: T-T-S schedule during inpatient admission  []      On CRRT    Antimicrobial dosage has been changed to:  daptomycin IV 500 mg T-T-S on HD days   Thank you for allowing pharmacy to be a part of this patient's care.  Feliciano Close, PharmD PGY2 Infectious Diseases Pharmacy Resident  02/19/2024 3:10 PM

## 2024-02-20 DIAGNOSIS — R680 Hypothermia, not associated with low environmental temperature: Secondary | ICD-10-CM

## 2024-02-20 DIAGNOSIS — L98493 Non-pressure chronic ulcer of skin of other sites with necrosis of muscle: Secondary | ICD-10-CM

## 2024-02-20 LAB — GLUCOSE, CAPILLARY
Glucose-Capillary: 52 mg/dL — ABNORMAL LOW (ref 70–99)
Glucose-Capillary: 147 mg/dL — ABNORMAL HIGH (ref 70–99)
Glucose-Capillary: 65 mg/dL — ABNORMAL LOW (ref 70–99)
Glucose-Capillary: 110 mg/dL — ABNORMAL HIGH (ref 70–99)
Glucose-Capillary: 81 mg/dL (ref 70–99)
Glucose-Capillary: 103 mg/dL — ABNORMAL HIGH (ref 70–99)

## 2024-02-20 LAB — HEPATITIS B E ANTIGEN: Hep B E Ag: NEGATIVE

## 2024-02-20 NOTE — Progress Notes (Signed)
 [facility] Progress Note   Subjective:    Seen in room. No issues with dialysis yesterday.  Had new MRI. Seen by neurosurgery yesterday  Pain is better today   Objective Vitals:   02/20/24 0007 02/20/24 0025 02/20/24 0733 02/20/24 1002  BP:  (!) 122/40 (!) 85/27 (!) 108/41  Pulse:  65 64 65  Resp: 20  20 18   Temp:  (!) 97.1 F (36.2 C)    TempSrc:  Oral    SpO2:  100% 96% 96%  Weight:      Height:       Physical Exam General: Chronically ill, frail, nad  Heart: RRR Lungs: CTAB Abdomen: soft Extremities: no LE edema, B BKA, some necrotic appearing ulcerations on L hand Dialysis Access: TDC, some reddened skin on the dressing  Filed Weights   02/17/24 0819 02/17/24 1154 02/19/24 0834  Weight: 41.7 kg 43.9 kg 46.6 kg    Intake/Output Summary (Last 24 hours) at 02/20/2024 1014 Last data filed at 02/19/2024 1744 Gross per 24 hour  Intake 806.81 ml  Output 0 ml  Net 806.81 ml     Additional Objective Labs: Basic Metabolic Panel: Recent Labs  Lab 02/14/24 0847 02/16/24 0712 02/17/24 0840 02/19/24 0852  NA 128* 128* 129* 126*  K 4.7 4.8 5.4* 4.5  CL 93* 94* 93* 91*  CO2 24 24 22 24   GLUCOSE 88 97 84 66*  BUN 38* 34* 48* 46*  CREATININE 3.11* 3.09* 3.60* 3.11*  CALCIUM  9.2 9.0 9.0 8.5*  PHOS 3.7 3.6 4.9*  --    Liver Function Tests: Recent Labs  Lab 02/14/24 0847 02/16/24 0712 02/17/24 0840  ALBUMIN  2.4* 2.2* 2.4*   No results for input(s): LIPASE, AMYLASE in the last 168 hours. CBC: Recent Labs  Lab 02/14/24 0846 02/16/24 0712 02/17/24 0840 02/19/24 0852  WBC 6.8 6.2 8.1 6.8  NEUTROABS 5.3  --   --  5.6  HGB 10.0* 10.8* 10.1* 9.9*  HCT 31.7* 33.9* 31.4* 30.0*  MCV 99.4 99.1 99.7 98.4  PLT 108* 95* 90* 82*   Blood Culture    Component Value Date/Time   SDES NEEDLE ASPIRATE 01/07/2024 1612   SPECREQUEST DISC 01/07/2024 1612   CULT  01/07/2024 1612    FEW METHICILLIN RESISTANT STAPHYLOCOCCUS AUREUS NO ANAEROBES  ISOLATED Performed at [facility], [address], [place], [place] [address]    REPTSTATUS 01/12/2024 FINAL 01/07/2024 1612    Cardiac Enzymes: Recent Labs  Lab 02/18/24 1835  CKTOTAL 35*   CBG: Recent Labs  Lab 02/19/24 0612 02/19/24 1550 02/19/24 1609 02/19/24 2133 02/20/24 0955  GLUCAP 87 49* 121* 99 52*   Iron  Studies: No results for input(s): IRON , TIBC, TRANSFERRIN, FERRITIN in the last 72 hours. Lab Results  Component Value Date   INR 1.1 01/07/2024   INR 1.1 12/15/2023   INR 1.2 10/08/2023     Medications:  DAPTOmycin Stopped (02/19/24 1900)   dextrose  20 mL/hr at 02/18/24 1233    (feeding supplement) PROSource Plus  30 mL Oral Daily   acetaminophen   1,000 mg Oral TID   acyclovir ointment  1 Application Topical 6 X Daily   artificial tears   Right Eye QID   bictegravir-emtricitabine -tenofovir  AF  1 tablet Oral Daily   Chlorhexidine  Gluconate Cloth  6 each Topical Q0600   darbepoetin (ARANESP ) injection - DIALYSIS  150 mcg Subcutaneous Q Fri-1800   feeding supplement (NEPRO CARB STEADY)  237 mL Oral BID AC & HS  fentaNYL   1 patch Transdermal Q72H   gabapentin   100 mg Oral TID   gatifloxacin   1 drop Right Eye BID   heparin   5,000 Units Subcutaneous Q8H   lidocaine   2 patch Transdermal Q24H   methocarbamol   500 mg Oral TID   midodrine   10 mg Oral TID WC   midodrine   10 mg Oral Q T,Th,Sa-HD   multivitamin  1 tablet Oral QHS   pantoprazole   40 mg Oral BID AC   sevelamer  carbonate  800 mg Oral TID WC    Dialysis Orders: [facility] - MWF 3.5h  B400  45.5kg  TDC  Heparin  2000 Mircera 100 mcg - last dose 12/01/23 Calcitriol  1.0 mcg (on hold due to high Ca) Uses midodrine  10mg  TID + extra pre-HD   Assessment/Plan: 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 Has been on long course of IV Vancomycin . Repeat MRI 10/8 showing infection has progressed. ID changed antibiotic to IV daptomycin. NSGY does not think he  is a surgical candidate and recommends IR involvement.  Pain management per primary  ESRD: Historically MWF schedule - on TTS this admit, prefers to stay on that schedule. Next HD 10/11.  Clean catheter with alcohol  instead of chlorhexidine - looks like allergy. BP/volume: Chronic hypotension on mido 10mg  TID and extra pre-HD. Below EDW now.  Anemia of ESRD: Hgb 9.9 - continue Aranesp  150mcg q Friday. No IV iron  d/t infection. HIV: controlled on Biktarvy  BMD: CorrCa high, VDRA on hold. Phos on goal, on sevelamer  as binder.  Nutrition: Alb low, continue supps and diet has been liberalized. Debility: PT/OT as tolerated. Dispo: Very difficult placement. Noted patient is uninsured thus SNF is not an option.  No family or safe dispo plan at this time.  Maisie Ronnald Acosta PA-C [facility] 02/20/2024,10:15 AM

## 2024-02-20 NOTE — Progress Notes (Signed)
 [facility]  Date of Admission:  12/14/2023       ASSESSMENT: Patient today is hypothermic. Has new necrotic lesion in the left hand.  He states it has become progressive and is quite painful. Clinically he is not toxic though he appears chronically ill.  He is able to have a conversation and states that the right lower extremity pain is severe and he really wants some pain medications.  He also states that his lower back does not hurt.  Patient states he is excited to eat his pizza. He is at high risk for aspiration. New multiloculated dorsal epidural abscess. Source abscesses. Progressive L4-L5 discitis/osteomyelitis since August 2025 in spite of being on antibiotics. New posterior right paraspinal abscesses at L4-L5. Patient is currently on IV daptomycin. He had finished a prolonged course of IV vancomycin . 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 HIV.  On Biktarvy . Cold sore.  PLAN: Neurosurgery evaluation appreciated. For possible IR aspiration though this might not clear up all his abscesses as he has psoas abscesses and multiple abscesses in the back. Patient has a vasculitic lesion, please consider echocardiogram to rule out endocarditis. Repeat blood cultures x 2 Continue Zovirax topical application for the cold sore. Patient apparently complained of diarrhea to the hospitalist.  But with me he states he did not have diarrhea.  Monitoring clinically. If patient clinically deteriorates, consider CT of the chest with no contrast to evaluate for any pneumonia or reticulonodular opacities due to daptomycin. If patient has aspirated, consider IV Zosyn  2.25 g every 8, dosed as per pharmacy. Will monitor CK levels.  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)   Severe  protein-calorie malnutrition   Normocytic anemia   ESRD on hemodialysis ([name])   S/P bilateral BKA (below knee amputation) ([name])   Chronic hepatitis C without hepatic coma ([name])   HIV (human immunodeficiency virus infection) ([name])   Hx MRSA infection   Acute osteomyelitis of lumbar spine ([name])   Epidural abscess   Paraspinal abscess ([name])   Pyogenic inflammation of bone ([name])    (feeding supplement) PROSource Plus  30 mL Oral Daily   acetaminophen   1,000 mg Oral TID   acyclovir ointment  1 Application Topical 6 X Daily   artificial tears   Right Eye QID   bictegravir-emtricitabine -tenofovir  AF  1 tablet Oral Daily   Chlorhexidine  Gluconate Cloth  6 each Topical Q0600   darbepoetin (ARANESP ) injection - DIALYSIS  150 mcg Subcutaneous Q Fri-1800   feeding supplement (NEPRO CARB STEADY)  237 mL Oral BID AC & HS   fentaNYL   1 patch Transdermal Q72H   gabapentin   100 mg Oral TID   gatifloxacin   1 drop Right Eye BID   heparin   5,000 Units Subcutaneous Q8H   lidocaine   2 patch Transdermal Q24H   methocarbamol   500 mg Oral TID   midodrine   10 mg Oral TID WC   midodrine   10 mg Oral Q T,Th,Sa-HD   multivitamin  1 tablet Oral QHS   pantoprazole   40 mg Oral BID AC   sevelamer  carbonate  800 mg Oral TID WC    SUBJECTIVE: Complains of right lower extremity pain.  Review of Systems: Review of Systems  Constitutional:  Positive for malaise/fatigue.       Patient is a chronically  ill-appearing individual.  He is hypothermic.  He states his right lower extremity hurts him a lot and he wants some pain medications.  He is blind in the left eye.  HENT: Negative.    Eyes:  Positive for blurred vision.  Respiratory: Negative.    Cardiovascular: Negative.   Gastrointestinal:  Positive for diarrhea.  Genitourinary: Negative.   Musculoskeletal:        Pain radiating down to the right lower extremity.  Neurological: Negative.   Endo/Heme/Allergies: Negative.   Psychiatric/Behavioral: Negative.       No Known Allergies  OBJECTIVE: Vitals:   02/20/24 1200 02/20/24 1300 02/20/24 1500 02/20/24 1632  BP:  102/72 100/68 99/70  Pulse:  67 68 68  Resp:  18 18 18   Temp: (!) 93 F (33.9 C) (!) 93.1 F (33.9 C) (!) 93.1 F (33.9 C) (!) 93.2 F (34 C)  TempSrc: Rectal Rectal Rectal Rectal  SpO2:  96% 95% 96%  Weight:      Height:       Body mass index is 20.75 kg/m.  Physical Exam Constitutional:      Appearance: He is ill-appearing.  HENT:     Head: Normocephalic and atraumatic.     Right Ear: External ear normal.     Left Ear: External ear normal.     Nose: Nose normal.  Eyes:     Pupils: Pupils are equal, round, and reactive to light.  Cardiovascular:     Rate and Rhythm: Normal rate and regular rhythm.  Pulmonary:     Effort: Pulmonary effort is normal.  Abdominal:     General: Bowel sounds are normal.  Musculoskeletal:     Cervical back: Normal range of motion.     Comments: Bilateral BKA.  Stumps appear clean  Skin:    General: Skin is warm and dry.  Neurological:     Mental Status: He is alert and oriented to person, place, and time. Mental status is at baseline.  Psychiatric:        Mood and Affect: Mood normal.        Behavior: Behavior normal.        Thought Content: Thought content normal.        Judgment: Judgment normal.     Lab Results Lab Results  Component Value Date   WBC 6.8 02/19/2024   HGB 9.9 (L) 02/19/2024   HCT 30.0 (L) 02/19/2024   MCV 98.4 02/19/2024   PLT 82 (L) 02/19/2024    Lab Results  Component Value Date   CREATININE 3.11 (H) 02/19/2024   BUN 46 (H) 02/19/2024   NA 126 (L) 02/19/2024   K 4.5 02/19/2024   CL 91 (L) 02/19/2024   CO2 24 02/19/2024    Lab Results  Component Value Date   ALT 9 12/15/2023   AST 19 12/15/2023   ALKPHOS 74 12/15/2023   BILITOT 1.8 (H) 12/15/2023     Microbiology: No results found for this or any previous visit (from the past 240 hours). Dr. Zoanne, MD [facility] for  Infectious Disease [facility] Phone: [phone]  @TODAY @ 8:01 PM   Total Encounter Time: 50 minutes

## 2024-02-20 NOTE — Progress Notes (Signed)
 Pt arrived from 5N via bed, temp 93.0 rectal, Hayden hugger started. BS 102 on D 10, call bell within reach, pt voices no needs at this time. Bed alarm on.

## 2024-02-20 NOTE — Progress Notes (Signed)
Patient transported to 4E

## 2024-02-20 NOTE — Plan of Care (Signed)
   Problem: Education: Goal: Knowledge of General Education information will improve Description Including pain rating scale, medication(s)/side effects and non-pharmacologic comfort measures Outcome: Progressing   Problem: Health Behavior/Discharge Planning: Goal: Ability to manage health-related needs will improve Outcome: Progressing

## 2024-02-20 NOTE — TOC Progression Note (Signed)
 Transition of Care [facility]) - Progression Note    Patient Details  Name: Alvin Daniels MRN: [id number] Date of Birth: [birth year]  [facility] CM/SW Contact  Bridget Cordella Simmonds, LCSW Phone Number: 02/20/2024, 2:31 PM  Clinical Narrative:    Somewhat lengthy conversation with pt with assistance of Raquel/interpreter.  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  He reports he is not on disability, does not have any source of income.  CSW inquired about how he pays rent/bills and he reports that he gets handouts from people to cover his costs.  Discussed if rent has been paid while he is at the hospital and he thinks it has, reports a church paid is 2 months ago.  Pt reports he has a contact at this church, listed on facesheet, and this is his only support.  No family support.  Discussed previous refusal to work with PT, last attempts one month ago.  Pt reports that his pain has prevented him from being able to use his prosthesis and has made working with PT impossible.  He reports he would be willing to try again now but still has significant pain.  Pt states several times that he wants very badly to be able to DC from the hospital.   Jon Hoit has spoken to the church contact (I'm not sure which one it is on the facesheet) and was told they did not pay his rent 2 months ago.  CSW email with [facility]/Financial Counseling: referral was made to [employer] for medicaid app/potential disability.  CSW received email from Joaine Mckeel, [email or web address].  They have been unable to get started with pt due to his being on contact precautions.  Per Dr Arlice, neurosurgery to evaluate pt.  Not stable for DC currently.    Expected Discharge Plan: Home/Self Care Barriers to Discharge: Continued Medical Work up, Inadequate or no insurance (does not have adequate home support in place)               Expected Discharge Plan and Services In-house  Referral: Clinical Social Work     Living arrangements for the past 2 months: Single Family Home Expected Discharge Date: 02/02/24                                     Social Drivers of Health (SDOH) Interventions SDOH Screenings   Food Insecurity: No Food Insecurity (12/15/2023)  Housing: Low Risk  (12/15/2023)  Transportation Needs: No Transportation Needs (12/15/2023)  Utilities: Not At Risk (12/15/2023)  Alcohol  Screen: Low Risk  (07/17/2023)  Depression (PHQ2-9): Low Risk  (07/17/2023)  Financial Resource Strain: Low Risk  (01/29/2023)  Physical Activity: Inactive (01/29/2023)  Social Connections: Moderately Isolated (10/08/2023)  Stress: No Stress Concern Present (01/29/2023)  Tobacco Use: Low Risk  (02/19/2024)  Health Literacy: Adequate Health Literacy (01/29/2023)    Readmission Risk Interventions     No data to display

## 2024-02-20 NOTE — Progress Notes (Signed)
   02/20/24 1015  Provider Notification  Provider Name/Title Dr. Mosie  Date Provider Notified 02/20/24  Time Provider Notified 1011  Method of Notification  (secure chat)  Notification Reason Critical Result  Test performed and critical result blood sugar  Date Critical Result Received 02/20/24  Time Critical Result Received 1011  Provider response No new orders  Date of Provider Response 02/20/24  Time of Provider Response 1012

## 2024-02-20 NOTE — Progress Notes (Signed)
 I nitified Dr. Mosie that patient's rectal temp was 93. He gave verbal orders for a bair hugger. After speaking with the charge nurse, Artist, he told me that a bair hugger is above the level of care we provide for patients on this medsurg/tele unit. I relayed that to the doctor via secure chat and shortly after that he came to the floor to see patient. After assessing the patient, he put in a transfer order, including warm blankets. Warm blankets applied but patient would not keep them on. Patient alert and oriented x4 eating pizza in bed.

## 2024-02-20 NOTE — Progress Notes (Signed)
 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 coli and Streptococcus anginosus) who presented on 12/14/2023 complaining of right hip with ambulation using his prosthetic leg.  Imaging was concerning for L4-L5 discitis/osteomyelitis with right psoas abscess.  He underwent L4-L5 disc aspiration by IR on 01/07/2024 with cultures growing MRSA.  He was started on a 6-week course of IV vancomycin  (EOT 02/18/2024) per ID.  Not deemed surgical candidate by orthopedics.  Assessment and Plan:  # L4-L5 discitis/osteomyelitis  # Bilateral right and left psoas abscesses - Patient presented with right hip pain in the setting of recent MRSA bacteremia. - MRI (8/4) and CT imaging (8/5) showed L4-L5 discitis/osteomyelitis with right psoas abscess - MRI lumbar spine (8/8) showed discitis/osteomyelitis at L4-L5 with right psoas abscess and epidural phlegmon, as well as small psoas abscess on the left - MRI lumbar spine (8/25) showed progressive osteomyelitis/discitis at L4-L5, progressive epidural phlegmon/abscess, severe spinal stenosis, along with multiloculated bilateral psoas abscesses (up to 3.1 cm on the left) - Underwent L4-L5 disc aspiration by IR on 01/07/2024 with cultures growing MRSA - Completed 6-week course of vancomycin  on 02/18/2024 - MRI lumbar spine (10/8) showed interval progression of L4-L5 discitis/osteomyelitis with subtotal vertebral body destruction, severe multilevel spinal  stenosis, new multiloculated dorsal epidural abscess extending from L4 to L1-L2, and additional new right paraspinal abscesses, with no significant change in right psoas muscle abscesses - Given progression and continued symptomatology despite 6 weeks of IV vancomycin , ID recommended revision of antibiotics to daptomycin as well as consultation with neurosurgery for possible surgical intervention - Neurosurgery was consulted and indicated no feasible surgical intervention as the patient is a poor candidate - Continue IV daptomycin  # Diarrhea - First reported on 02/18/2024, likely due to receiving scheduled MiraLAX  and prolonged antibiotic therapy - MiraLAX  was discontinued - Continue as needed loperamide  - Discussed with Dr. Zoanne, ID, re: sending GIPP/C. Diff studies, opted to hold off for now  # Hypothermia - Patient was noted to be hypothermic to 93 F on rectal temp today, confirmed on multiple readings - Patient reported feeling cold but was insistent that it was due to the cold IV fluids he was receiving, requested discontinuation of IVF - Ordered Bair hugger but was informed that patient would need to be transferred to a Progressive floor. Transfer initiated  # Episodic hypoglycemia - Patient had intermittent episodes of hypoglycemia in the past few days and was started on D10 drip at 20 mL/h -In spite of the D10 drip, the patient was noted to be hypoglycemic to 52 this morning with improvement after half amp of D50 push.  Blood glucose dropped again 65 requiring another half amp of D50 push. - Given the patient's insistence that he was cold due to IV fluids, D10 drip was discontinued at his request - He is reportedly not eating well due to not liking hospital food.  He was encouraged to order any food he likes or have friends bring food from outside.    # ESRD on hemodialysis (MWF) - Nephrology  following for maintenance hemodialysis  # Chronic hypotension - Continue midodrine  10 mg  3 times daily and post HD  # Anemia of ESRD - Continue Aranesp  and iron  as per nephrology  # Hyponatremia - Sodium low but stable  # HIV - CD4 count 254 (10/08/2023) - Continue Biktarvy   # Chronic HCV - Follow-up with ID as outpatient  # PAD status post bilateral BKA - Was ambulating using prosthetics prior to hospitalization - Not ambulating while hospitalized and refusing to work with PT stating pain is a limiting factor  # Neurotrophic keratopathy of the right eye -Was evaluated by ophthalmology on 01/08/2024 - Continue moxifloxacin eyedrops twice daily OD and lubricant drops 4 times daily OD  #Chronic intrahepatic/extrahepatic biliary dilatation - Stable, no acute findings Incidental imaging unchanged from prior studies   Subjective: Patient was seen in bed today.  RN staff notified of episode of hypoglycemia to 52 this morning requiring half amp of D50 push despite patient being on D10 drip.  Additionally, he was later noted to be hypothermic with a rectal temp of 93 F.  On examination, the patient insisted he was cold due to the cold IV fluids and requested that they be discontinued.  He also reported not eating much due to not enjoying the hospital food stating he would like to order some pizza.  He continues to report ongoing diarrhea and requests Imodium .   Was informed by charge nurse that patient will need to be transferred to progressive care unit as Bair hugger's cannot be utilized on med/tele floors  Physical Exam: Vitals:   02/20/24 1200 02/20/24 1300 02/20/24 1500 02/20/24 1632  BP:  102/72 100/68 99/70  Pulse:  67 68 68  Resp:  18 18 18   Temp: (!) 93 F (33.9 C) (!) 93.1 F (33.9 C) (!) 93.1 F (33.9 C) (!) 93.2 F (34 C)  TempSrc: Rectal Rectal Rectal Rectal  SpO2:  96% 95% 96%  Weight:      Height:       Physical Exam Constitutional:      General: He is not in acute distress.    Appearance: He is underweight. He is ill-appearing.  HENT:     Head:      Comments: Area of ulceration and bleeding at right corner of mouth    Mouth/Throat:     Mouth: Mucous membranes are dry.  Cardiovascular:     Rate and Rhythm: Normal rate and regular rhythm.     Heart sounds: Normal heart sounds. No murmur heard. Pulmonary:     Effort: Pulmonary effort is normal. No respiratory distress.     Breath sounds: Normal breath sounds. No wheezing.  Abdominal:     Palpations: Abdomen is soft.     Tenderness: There is no abdominal tenderness.  Musculoskeletal:     Comments: Necrotic nail and distal portion of left middle finger.  Amputated left fourth finger.     Right Lower Extremity: Right leg is amputated below knee.     Left Lower Extremity: Left leg is amputated below knee.  Skin:    General: Skin is dry.  Neurological:     Mental Status: He is alert and oriented to person, place, and time. Mental status is at baseline.     Family Communication: None  Disposition: Status is: Inpatient Remains inpatient appropriate because: acuity of illness and need for IV antibiotics  Planned Discharge Destination: Difficult placement - TBD    Time spent: 75 minutes  Author: Kearsten Ginther Al-Sultani,  MD 02/20/2024 7:35 PM  For on call review [email or web address].

## 2024-02-20 NOTE — Progress Notes (Addendum)
 Patient ID: Alvin Daniels, male   DOB: 05/21/1970, 53 y.o.   MRN: [id number] I have now gone over the patient's clinical course and imaging and decision making with 2 more of my partners independently, and both agree that spine surgery should not play a role here.  His albumin  is 2, he is certainly not optimized for surgery, and we feel he would not heal the surgery.  And because I do not believe that the goals of surgery would be met, the risks outweigh the benefits.  While I do agree there has been a failure of antibiotics, the epidural process is directly related to the psoas process (it does not look like the epidural abscess or phlegmon is coming from the facet, but likely through the foramen from the psoas and the retroperitoneal space).  We would not have access to the psoas during spine surgery as it is in the retroperitoneal space, and we do not have access to the retroperitoneal space through a laminectomy approach for epidural abscess or phlegmon.  And therefore, surgery would not address the biggest part of the problem.  We would only be able to remove a small amount of his infectious burden.  And I do not believe removing that would improve his response to antibiotics, his functional status, or his pain.  I certainly understand the concern that IR may not be able to drain all of the abscesses or all of the infection from the psoas, and I am not sure what surgical service would approach the retroperitoneal space for psoas abscess, but it would not be neurosurgery.  Removing infection from his epidural space would not fix the psoas abscesses or the paraspinous abscesses or address those in any way and therefore would not address his failure to respond to antibiotics . I even spoke with our complex spine surgeon who does endoscopic approaches to the spine because of our worries about healing, and he did not feel that that surgery would be beneficial.  Again, this is a very complex and  difficult situation and decision making is not straightforward, but as a service we do not believe that laminectomy should play a role at this time.  In time, he could require a much larger surgery such as an L4 vertebrectomy or at least a long posterior stabilization construct for possible instability and deformity related to the osteomyelitis and discitis, but I do not think he will ever really be a candidate for such a large surgery given his overall health status.  Regarding his severe back pain and hip pain as a reason to consider surgery, there is no surgery that I could perform that would likely address that issue.  He has had that pain since before this admission, and he will have severe back pain from the L4 osteomyelitis and L4-5 discitis, and laminectomy for epidural phlegmon will not change that pain.  That would have to be addressed with a much larger surgery as described above, and he is certainly not a candidate for that type of surgery at this time.

## 2024-02-20 NOTE — Plan of Care (Signed)

## 2024-02-20 NOTE — Progress Notes (Signed)
 MEWS Progress Note  Patient Details Name: Alvin Daniels MRN: [id number] DOB: Jun 12, 1970 Today's Date: 02/20/2024   MEWS Flowsheet Documentation:  Assess: MEWS Score Temp: (!) 93.2 F (34 C) BP: 99/70 MAP (mmHg): 81 Pulse Rate: 68 ECG Heart Rate: 75 Resp: 18 Level of Consciousness: Alert SpO2: 96 % O2 Device: Room Air Patient Activity (if Appropriate): In bed O2 Flow Rate (L/min): 0 L/min Assess: MEWS Score MEWS Temp: 2 MEWS Systolic: 1 MEWS Pulse: 0 MEWS RR: 0 MEWS LOC: 0 MEWS Score: 3 MEWS Score Color: Yellow Assess: SIRS CRITERIA SIRS Temperature : 1 SIRS Respirations : 0 SIRS Pulse: 0 SIRS WBC: 0 SIRS Score Sum : 1 SIRS Temperature : 1 SIRS Pulse: 0 SIRS Respirations : 0 SIRS WBC: 0 SIRS Score Sum : 1 Assess: if the MEWS score is Yellow or Red Were vital signs accurate and taken at a resting state?: Yes Does the patient meet 2 or more of the SIRS criteria?: Yes Does the patient have a confirmed or suspected source of infection?: Yes MEWS guidelines implemented : No, previously yellow, continue vital signs every 4 hours   Rectal temp taken. Dr. Janese Clotilda Daring 02/20/2024, 6:04 PM

## 2024-02-21 ENCOUNTER — Inpatient Hospital Stay (HOSPITAL_COMMUNITY): Payer: MEDICAID

## 2024-02-21 DIAGNOSIS — R4182 Altered mental status, unspecified: Secondary | ICD-10-CM

## 2024-02-21 DIAGNOSIS — Z7189 Other specified counseling: Secondary | ICD-10-CM

## 2024-02-21 DIAGNOSIS — E11649 Type 2 diabetes mellitus with hypoglycemia without coma: Secondary | ICD-10-CM

## 2024-02-21 DIAGNOSIS — M4640 Discitis, unspecified, site unspecified: Secondary | ICD-10-CM

## 2024-02-21 DIAGNOSIS — J9601 Acute respiratory failure with hypoxia: Secondary | ICD-10-CM

## 2024-02-21 DIAGNOSIS — Z515 Encounter for palliative care: Secondary | ICD-10-CM

## 2024-02-21 DIAGNOSIS — I509 Heart failure, unspecified: Secondary | ICD-10-CM

## 2024-02-21 DIAGNOSIS — R52 Pain, unspecified: Secondary | ICD-10-CM

## 2024-02-21 LAB — POCT I-STAT 7, (LYTES, BLD GAS, ICA,H+H)
Acid-base deficit: 3 mmol/L — ABNORMAL HIGH (ref 0.0–2.0)
Bicarbonate: 22 mmol/L (ref 20.0–28.0)
Calcium, Ion: 1.22 mmol/L (ref 1.15–1.40)
HCT: 25 % — ABNORMAL LOW (ref 39.0–52.0)
Hemoglobin: 8.5 g/dL — ABNORMAL LOW (ref 13.0–17.0)
O2 Saturation: 98 %
Potassium: 3.4 mmol/L — ABNORMAL LOW (ref 3.5–5.1)
Sodium: 130 mmol/L — ABNORMAL LOW (ref 135–145)
TCO2: 23 mmol/L (ref 22–32)
pCO2 arterial: 40.2 mmHg (ref 32–48)
pH, Arterial: 7.346 — ABNORMAL LOW (ref 7.35–7.45)
pO2, Arterial: 103 mmHg (ref 83–108)

## 2024-02-21 LAB — TROPONIN I (HIGH SENSITIVITY): Troponin I (High Sensitivity): 118 ng/L (ref <18)

## 2024-02-21 LAB — GLUCOSE, CAPILLARY
Glucose-Capillary: 101 mg/dL — ABNORMAL HIGH (ref 70–99)
Glucose-Capillary: 123 mg/dL — ABNORMAL HIGH (ref 70–99)
Glucose-Capillary: 133 mg/dL — ABNORMAL HIGH (ref 70–99)
Glucose-Capillary: 173 mg/dL — ABNORMAL HIGH (ref 70–99)
Glucose-Capillary: 45 mg/dL — ABNORMAL LOW (ref 70–99)
Glucose-Capillary: 53 mg/dL — ABNORMAL LOW (ref 70–99)
Glucose-Capillary: 53 mg/dL — ABNORMAL LOW (ref 70–99)
Glucose-Capillary: 56 mg/dL — ABNORMAL LOW (ref 70–99)
Glucose-Capillary: 62 mg/dL — ABNORMAL LOW (ref 70–99)
Glucose-Capillary: 69 mg/dL — ABNORMAL LOW (ref 70–99)
Glucose-Capillary: 70 mg/dL (ref 70–99)
Glucose-Capillary: 71 mg/dL (ref 70–99)
Glucose-Capillary: 86 mg/dL (ref 70–99)
Glucose-Capillary: 87 mg/dL (ref 70–99)

## 2024-02-21 LAB — RENAL FUNCTION PANEL
Albumin: 2 g/dL — ABNORMAL LOW (ref 3.5–5.0)
Anion gap: 12 (ref 5–15)
BUN: 35 mg/dL — ABNORMAL HIGH (ref 6–20)
CO2: 24 mmol/L (ref 22–32)
Calcium: 8 mg/dL — ABNORMAL LOW (ref 8.9–10.3)
Chloride: 91 mmol/L — ABNORMAL LOW (ref 98–111)
Creatinine, Ser: 2.51 mg/dL — ABNORMAL HIGH (ref 0.61–1.24)
GFR, Estimated: 30 mL/min — ABNORMAL LOW (ref >60)
Glucose, Bld: 115 mg/dL — ABNORMAL HIGH (ref 70–99)
Phosphorus: 3.2 mg/dL (ref 2.5–4.6)
Potassium: 3.6 mmol/L (ref 3.5–5.1)
Sodium: 127 mmol/L — ABNORMAL LOW (ref 135–145)

## 2024-02-21 LAB — CBC
HCT: 24.3 % — ABNORMAL LOW (ref 39.0–52.0)
Hemoglobin: 8 g/dL — ABNORMAL LOW (ref 13.0–17.0)
MCH: 32.3 pg (ref 26.0–34.0)
MCHC: 32.9 g/dL (ref 30.0–36.0)
MCV: 98 fL (ref 80.0–100.0)
Platelets: 51 K/uL — ABNORMAL LOW (ref 150–400)
RBC: 2.48 MIL/uL — ABNORMAL LOW (ref 4.22–5.81)
RDW: 19.7 % — ABNORMAL HIGH (ref 11.5–15.5)
WBC: 9.3 K/uL (ref 4.0–10.5)
nRBC: 0 % (ref 0.0–0.2)

## 2024-02-21 LAB — MRSA NEXT GEN BY PCR, NASAL: MRSA by PCR Next Gen: NOT DETECTED

## 2024-02-21 LAB — LACTIC ACID, PLASMA: Lactic Acid, Venous: 1.3 mmol/L (ref 0.5–1.9)

## 2024-02-21 MED ORDER — ORAL CARE MOUTH RINSE
15.0000 mL | OROMUCOSAL | Status: DC
  Administered 2024-02-22 – 2024-02-29 (×95): 15 mL via OROMUCOSAL

## 2024-02-21 MED ORDER — ALTEPLASE 2 MG IJ SOLR: 2.0000 mg | Freq: Once | INTRAMUSCULAR | Status: DC | PRN

## 2024-02-21 MED ORDER — MIDAZOLAM HCL 2 MG/2ML IJ SOLN
INTRAMUSCULAR | Status: AC
  Administered 2024-02-21: 2 mg via INTRAVENOUS
  Filled 2024-02-21: qty 2

## 2024-02-21 MED ORDER — ORAL CARE MOUTH RINSE: 15.0000 mL | OROMUCOSAL | Status: DC | PRN

## 2024-02-21 MED ORDER — PHENYLEPHRINE 80 MCG/ML (10ML) SYRINGE FOR IV PUSH (FOR BLOOD PRESSURE SUPPORT): 200.0000 ug | PREFILLED_SYRINGE | Freq: Once | INTRAVENOUS | Status: AC

## 2024-02-21 MED ORDER — SODIUM CHLORIDE 0.9 % IV BOLUS
1000.0000 mL | Freq: Once | INTRAVENOUS | Status: AC
  Administered 2024-02-21: 1000 mL via INTRAVENOUS

## 2024-02-21 MED ORDER — FENTANYL CITRATE (PF) 50 MCG/ML IJ SOSY
PREFILLED_SYRINGE | INTRAMUSCULAR | Status: AC
  Administered 2024-02-21: 50 ug via INTRAVENOUS
  Filled 2024-02-21: qty 2

## 2024-02-21 MED ORDER — FENTANYL CITRATE (PF) 50 MCG/ML IJ SOSY
25.0000 ug | PREFILLED_SYRINGE | Freq: Once | INTRAMUSCULAR | Status: DC
  Filled 2024-02-21: qty 1

## 2024-02-21 MED ORDER — ALBUMIN HUMAN 25 % IV SOLN
50.0000 g | Freq: Once | INTRAVENOUS | Status: AC
  Administered 2024-02-21: 12.5 g via INTRAVENOUS
  Filled 2024-02-21: qty 200

## 2024-02-21 MED ORDER — PHENYLEPHRINE 80 MCG/ML (10ML) SYRINGE FOR IV PUSH (FOR BLOOD PRESSURE SUPPORT)
PREFILLED_SYRINGE | INTRAVENOUS | Status: AC
  Administered 2024-02-21: 200 ug via INTRAVENOUS
  Filled 2024-02-21: qty 20

## 2024-02-21 MED ORDER — VANCOMYCIN HCL IN DEXTROSE 1-5 GM/200ML-% IV SOLN
1000.0000 mg | Freq: Once | INTRAVENOUS | Status: AC
  Administered 2024-02-21: 1000 mg via INTRAVENOUS
  Filled 2024-02-21: qty 200

## 2024-02-21 MED ORDER — HEPARIN SODIUM (PORCINE) 1000 UNIT/ML DIALYSIS: 2000.0000 [IU] | INTRAMUSCULAR | Status: DC | PRN

## 2024-02-21 MED ORDER — SODIUM CHLORIDE 0.9 % IV SOLN: 250.0000 mL | INTRAVENOUS | Status: DC

## 2024-02-21 MED ORDER — NOREPINEPHRINE 4 MG/250ML-% IV SOLN
0.0000 ug/min | INTRAVENOUS | Status: DC
Start: 2024-02-21 — End: 2024-02-21
  Administered 2024-02-21: 2 ug/min via INTRAVENOUS

## 2024-02-21 MED ORDER — ETOMIDATE 2 MG/ML IV SOLN: 20.0000 mg | Freq: Once | INTRAVENOUS | Status: AC

## 2024-02-21 MED ORDER — DOCUSATE SODIUM 50 MG/5ML PO LIQD
100.0000 mg | Freq: Two times a day (BID) | ORAL | Status: DC
  Administered 2024-02-21 – 2024-02-29 (×9): 100 mg
  Filled 2024-02-21 (×13): qty 10

## 2024-02-21 MED ORDER — MIDAZOLAM HCL 2 MG/2ML IJ SOLN: 2.0000 mg | Freq: Once | INTRAMUSCULAR | Status: AC

## 2024-02-21 MED ORDER — PIPERACILLIN-TAZOBACTAM IN DEX 2-0.25 GM/50ML IV SOLN
2.2500 g | Freq: Three times a day (TID) | INTRAVENOUS | Status: DC
  Administered 2024-02-21 – 2024-02-22 (×4): 2.25 g via INTRAVENOUS
  Filled 2024-02-21 (×6): qty 50

## 2024-02-21 MED ORDER — NOREPINEPHRINE 4 MG/250ML-% IV SOLN
0.0000 ug/min | INTRAVENOUS | Status: DC
  Administered 2024-02-21: 10 ug/min via INTRAVENOUS
  Administered 2024-02-21: 2 ug/min via INTRAVENOUS
  Administered 2024-02-22: 5 ug/min via INTRAVENOUS
  Administered 2024-02-23: 4 ug/min via INTRAVENOUS
  Filled 2024-02-21 (×4): qty 250

## 2024-02-21 MED ORDER — DEXMEDETOMIDINE HCL IN NACL 400 MCG/100ML IV SOLN
0.0000 ug/kg/h | INTRAVENOUS | Status: DC
  Administered 2024-02-21: 0.4 ug/kg/h via INTRAVENOUS
  Administered 2024-02-22 (×2): 1.1 ug/kg/h via INTRAVENOUS
  Administered 2024-02-22: 0.8 ug/kg/h via INTRAVENOUS
  Administered 2024-02-23 (×2): 1.2 ug/kg/h via INTRAVENOUS
  Filled 2024-02-21 (×6): qty 100

## 2024-02-21 MED ORDER — ROCURONIUM BROMIDE 10 MG/ML (PF) SYRINGE
PREFILLED_SYRINGE | INTRAVENOUS | Status: AC
  Filled 2024-02-21: qty 10

## 2024-02-21 MED ORDER — FENTANYL 2500MCG IN NS 250ML (10MCG/ML) PREMIX INFUSION
0.0000 ug/h | INTRAVENOUS | Status: DC
  Administered 2024-02-21: 25 ug/h via INTRAVENOUS
  Administered 2024-02-22: 75 ug/h via INTRAVENOUS
  Administered 2024-02-23 (×2): 150 ug/h via INTRAVENOUS
  Administered 2024-02-24: 75 ug/h via INTRAVENOUS
  Administered 2024-02-25: 175 ug/h via INTRAVENOUS
  Administered 2024-02-26 – 2024-02-27 (×2): 100 ug/h via INTRAVENOUS
  Administered 2024-02-28 – 2024-02-29 (×2): 125 ug/h via INTRAVENOUS
  Filled 2024-02-21 (×10): qty 250

## 2024-02-21 MED ORDER — POLYETHYLENE GLYCOL 3350 17 G PO PACK
17.0000 g | PACK | Freq: Every day | ORAL | Status: DC
  Administered 2024-02-22 – 2024-02-29 (×4): 17 g
  Filled 2024-02-21 (×8): qty 1

## 2024-02-21 MED ORDER — PHENYLEPHRINE 200 MCG/ML (NON-ED) FOR PRIAPISM: 200.0000 ug | Freq: Once | INTRAMUSCULAR | Status: DC

## 2024-02-21 MED ORDER — SODIUM CHLORIDE 0.9 % IV BOLUS
500.0000 mL | Freq: Once | INTRAVENOUS | Status: AC
  Administered 2024-02-21: 500 mL via INTRAVENOUS

## 2024-02-21 MED ORDER — VANCOMYCIN HCL 500 MG/100ML IV SOLN: 500.0000 mg | INTRAVENOUS | Status: DC

## 2024-02-21 MED ORDER — FENTANYL CITRATE (PF) 50 MCG/ML IJ SOSY: 50.0000 ug | PREFILLED_SYRINGE | Freq: Once | INTRAMUSCULAR | Status: AC

## 2024-02-21 MED ORDER — MIDAZOLAM HCL 2 MG/2ML IJ SOLN
INTRAMUSCULAR | Status: AC
  Filled 2024-02-21: qty 2

## 2024-02-21 MED ORDER — DEXTROSE 50 % IV SOLN
INTRAVENOUS | Status: AC
  Filled 2024-02-21: qty 50

## 2024-02-21 MED ORDER — ETOMIDATE 2 MG/ML IV SOLN
INTRAVENOUS | Status: AC
  Administered 2024-02-21: 20 mg via INTRAVENOUS
  Filled 2024-02-21: qty 20

## 2024-02-21 MED ORDER — FENTANYL BOLUS VIA INFUSION
25.0000 ug | INTRAVENOUS | Status: DC | PRN
  Administered 2024-02-21: 50 ug via INTRAVENOUS
  Administered 2024-02-21: 25 ug via INTRAVENOUS
  Administered 2024-02-22: 75 ug via INTRAVENOUS
  Administered 2024-02-22 (×2): 100 ug via INTRAVENOUS
  Administered 2024-02-22: 50 ug via INTRAVENOUS
  Administered 2024-02-22: 100 ug via INTRAVENOUS
  Administered 2024-02-22: 50 ug via INTRAVENOUS
  Administered 2024-02-22: 25 ug via INTRAVENOUS
  Administered 2024-02-22: 50 ug via INTRAVENOUS
  Administered 2024-02-23 (×5): 100 ug via INTRAVENOUS
  Administered 2024-02-24: 25 ug via INTRAVENOUS
  Administered 2024-02-25: 75 ug via INTRAVENOUS
  Administered 2024-02-25 (×2): 100 ug via INTRAVENOUS
  Administered 2024-02-25: 75 ug via INTRAVENOUS
  Administered 2024-02-25 – 2024-02-27 (×9): 100 ug via INTRAVENOUS
  Administered 2024-02-29 (×2): 50 ug via INTRAVENOUS
  Administered 2024-02-29: 75 ug via INTRAVENOUS

## 2024-02-21 MED ORDER — FENTANYL CITRATE (PF) 50 MCG/ML IJ SOSY
50.0000 ug | PREFILLED_SYRINGE | Freq: Once | INTRAMUSCULAR | Status: AC
  Administered 2024-02-21: 50 ug via INTRAVENOUS

## 2024-02-21 MED ORDER — NALOXONE HCL 0.4 MG/ML IJ SOLN
INTRAMUSCULAR | Status: AC
  Filled 2024-02-21: qty 1

## 2024-02-21 MED ORDER — DEXTROSE 10 % IV SOLN: INTRAVENOUS | Status: DC

## 2024-02-21 NOTE — Progress Notes (Signed)
 Hypoglycemic Event  CBG: 53  Treatment: 4 oz juice/soda and D50 25 mL (12.5 gm)  Symptoms: None  Follow-up CBG: Time:1609  CBG Result:123   Possible Reasons for Event: Unknown  Comments/MD notified:Whitney Harris NP    Josette Alvin Daniels

## 2024-02-21 NOTE — Progress Notes (Signed)
 eLink Physician-Brief Progress Note Patient Name: Alvin Daniels DOB: [birth year] MRN: [id number]   Date of Service  02/21/2024  HPI/Events of Note  Recurrent hypoglycemic events requiring treatment  eICU Interventions   initiated D10 infusion   0130 -patient self extubated, immediately called ground team for evaluation.  Rapidly escalated vasopressors at bedside with nursing staff.  Preoxygenated with bag mask ventilation  Intervention Category Intermediate Interventions: Hyperglycemia - evaluation and treatment  Alvin Daniels 02/21/2024, 8:39 PM

## 2024-02-21 NOTE — Progress Notes (Signed)
 Pharmacy Antibiotic Note  Alvin Daniels is a 53 y.o. male admitted on 12/14/2023 with lumbar osteomyelitis, psoas abscess, paraspinal abscess and epidural phlegmon.  Repeat MRI shows new abscess and vancomycin  was switched to Cubicin.  Now with possible septic shock, so Pharmacy has been consulted for vancomycin  and Zosyn  dosing.  Patient has ESRD on TTS HD.  Unsure of plan given need for pressor.  HIV in Biktarvy   Plan: Vanc 1gm IV x 1, then 500mg  IV qHD TTS Zosyn  2.25gm IV Q8H Monitor dialysis plans, micro data, clinical progress and vanc levels as indicated  Height: 4' 11 (149.9 cm) Weight: 46.3 kg (102 lb 1.2 oz) IBW/kg (Calculated) : 47.7  Temp (24hrs), Avg:94.8 F (34.9 C), Min:93 F (33.9 C), Max:97.9 F (36.6 C)  Recent Labs  Lab 02/16/24 0712 02/17/24 0628 02/17/24 0840 02/19/24 0852 02/21/24 0422 02/21/24 0813  WBC 6.2  --  8.1 6.8 9.3  --   CREATININE 3.09*  --  3.60* 3.11* 2.51*  --   LATICACIDVEN  --   --   --   --   --  1.3  VANCORANDOM  --  25  --   --   --   --     Estimated Creatinine Clearance: 22.3 mL/min (A) (by C-G formula based on SCr of 2.51 mg/dL (H)).    No Known Allergies  Vanc 8/5 >> 10/8, 10/11 >> Zosyn  10/11 >>  Dapto 10/8 >> 10/11 Cefepime  8/5 >> 8/28 Cipro  8/14 >> 8/15 Zymaxid  to R eye BID and lubricant QID per Ophth 8/20 >>   10/8 CK = 35    8/4 blood: negative 8/27 surgical/needle aspirate: few MRSA 8/27 disc biopsy by IR +MRSA  Marijean Montanye D. Lendell, PharmD, BCPS, BCCCP 02/21/2024, 9:33 AM

## 2024-02-21 NOTE — Consult Note (Signed)
 Palliative Care Consult Note                                  Date: 02/21/2024   Patient Name: Alvin Daniels  DOB: August 09, 1970  MRN: [id number]  Age / Sex: 53 y.o., male  PCP: Pcp, No Referring Physician: Gatha Pence, MD  Reason for Consultation: Establishing goals of care  HPI/Patient Profile: Palliative Care consult requested for goals of care discussion in this 53 y.o. 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  He was admitted on 12/14/2023 from home with inability to walk due to uncontrolled pain.  During workup MRI of sacrum showed discitis and osteomyelitis of L4-5 with associated paraspinal inflammatory changes and right psoas mass.  Patient started on IV vancomycin  and cefepime .  Past Medical History:  Diagnosis Date   Acute bilateral low back pain 01/03/2021   AIDS ([name]) 11/22/2014   Back pain 06/09/2019   BKA stump complication ([name]) 06/09/2019   Chronic diarrhea    Chronic hepatitis C without hepatic coma ([name]) 11/22/2014   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[name])    Right   Corneal ulcer 12/18/2022   Descemetocele of left eye 12/18/2022   Diabetic neuropathy ([name])    ESRD (end stage renal disease) on dialysis ([name])    TTS; don't remember street name (05/03/2014)   Hepatitis C    HIV INFECTION 06/27/2010   Qualifier: Diagnosis of  By: Renae CMA ( AAMA), Jacqueline     Hypotension 06/02/2012   Metabolic bone disease 06/18/2012   MRSA infection    Normocytic anemia 06/17/2012   Pancreatitis    Pressure ulcer of BKA stump, stage 2 ([name]) 11/22/2014   Severe protein-calorie malnutrition 06/17/2012   Uncontrolled diabetes mellitus with  complications 06/27/2010   Annotation: uncontrolled Qualifier: Diagnosis of  By: Renae CMA ( AAMA), Jacqueline     Vaccine counseling 05/29/2022     Subjective:   This NP Levon Freud reviewed medical records, received report from team, assessed the patient and then met at the patient's bedside with [name] and interpreter to discuss diagnosis, prognosis, GOC, EOL wishes disposition and options.  Patient is awake and alert without confusion.  He is sitting upright in bed eating and initially on the phone upon my arrival.  He is able to engage appropriately in discussions with use of Spanish interpretation.   Concept of Palliative Care was introduced as specialized medical care for people and their families living with serious illness.  It focuses on providing relief from the symptoms and stress of a serious illness.  The goal is to improve quality of life for both the patient and the family. Values and goals of care important to patient and family were attempted to be elicited.  I created space and opportunity for patient to explore state of health prior to admission, thoughts, and feelings.   He has been living in [place] for 24 years and resides alone. He has no family locally, with his family being in [place], [place]. He has three sisters in [place] and has never married.  No children.  He mentions a friend, Alvin Daniels  Prior to admission patient states he has been  challenged with multiple medical conditions however felt like he was getting better at one point.  We discussed His current illness and what it means in the larger context of His on-going co-morbidities. Natural disease trajectory and expectations were discussed.  [name] verbalized understanding of current illness and co-morbidities. He shares has been undergoing dialysis for 17 years. Recently, his blood pressure has been consistently low, preventing him from receiving dialysis.  He is concerned about missing dialysis sessions due to this issue and notes that his blood pressure fluctuates consistently even prior to hospitalization.  States my blood pressure is always low.  I had a open and honest conversation with patient explaining concerns about consistently low blood pressures, risk of sudden death, in collaboration with his other medical comorbidities.  Patient verbalized understanding stating wishes not to allow him to die until he returns to his country.  Acknowledge patient wishes empathetically expressing unfortunately at this time he is not in a position to safely travel back to [place] or leave the hospital.  Patient is repeatedly asking for assistance to get back to [place] inquiring about a medical team to escort him.  Advised at this time that request is most likely not possible and realistic.  Patient verbalized understanding expressing clearly his wishes to continue with full scope care including full code to allow him every opportunity to show stability with a goal of returning back to [place] as soon as possible.  He states he would then continue medical care there and if he passes away he will be at peace amongst his family.  I approach discussions in regards to advanced directives and again addressing his CODE STATUS out of concern of sudden changes due to minimal signs of improvement.  Patient states his wishes is to remain a full code again acknowledging wishes to do everything possible to keep him alive.  He states he does not have any family here however in the event he is unable to speak for himself he would like his friend Alvin Daniels to make medical decisions for him.  He states she is aware. I explained I am greatly concerned that he will suffer a medical emergency that may lead to sudden death or the need to make complex medical decisions. He verbalized understanding continuing to state just get me back to [place].  Alvin Daniels states he  experiences severe pain in his right hip, which radiates to his buttocks and prevents him from using a prosthesis in his leg. The pain is persistent, and he has requested medication to alleviate it.   I discussed the importance of continued conversation with family and their medical providers regarding overall plan of care and treatment options, ensuring decisions are within the context of the patients values and GOCs.  Questions and concerns were addressed. The patient was encouraged to call with questions or concerns.  PMT will continue to support holistically as needed.  Objective:   Primary Diagnoses: Present on Admission:  Psoas abscess ([name])  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)  Severe protein-calorie malnutrition  Normocytic anemia  Chronic hepatitis C without hepatic coma ([name])  HIV (human immunodeficiency virus infection) ([name])  Hx MRSA infection   Scheduled Meds:  (feeding supplement) PROSource Plus  30 mL Oral Daily   acetaminophen   1,000 mg Oral TID   acyclovir ointment  1 Application Topical 6 X Daily   artificial tears   Right Eye QID   bictegravir-emtricitabine -tenofovir  AF  1 tablet Oral Daily  Chlorhexidine  Gluconate Cloth  6 each Topical Q0600   darbepoetin (ARANESP ) injection - DIALYSIS  150 mcg Subcutaneous Q Fri-1800   feeding supplement (NEPRO CARB STEADY)  237 mL Oral BID AC & HS   gabapentin   100 mg Oral TID   gatifloxacin   1 drop Right Eye BID   heparin   5,000 Units Subcutaneous Q8H   lidocaine   2 patch Transdermal Q24H   methocarbamol   500 mg Oral TID   midodrine   10 mg Oral TID WC   midodrine   10 mg Oral Q T,Th,Sa-HD   multivitamin  1 tablet Oral QHS   pantoprazole   40 mg Oral BID AC   sevelamer  carbonate  800 mg Oral TID WC    Continuous Infusions:  sodium chloride  Stopped (02/21/24 1029)   dextrose  Stopped (02/20/24 0100)   norepinephrine  (LEVOPHED ) Adult infusion 6 mcg/min (02/21/24 1200)   piperacillin -tazobactam  (ZOSYN )  IV     vancomycin       PRN Meds: alteplase , dextrose , heparin , hydrALAZINE , ibuprofen , ipratropium-albuterol , lip balm, loperamide , metoprolol  tartrate, ondansetron  (ZOFRAN ) IV, oxyCODONE , sodium chloride  flush  No Known Allergies  Review of Systems  Constitutional:  Positive for fatigue.  Musculoskeletal:        Sacral pain   Unless otherwise noted, a complete review of systems is negative.  Physical Exam General: NAD, frail chronically-ill appearing Cardiovascular:Tachycardic, hypotensive Pulmonary: diminished bilaterally  Abdomen: soft, nontender, + bowel sounds Extremities: Bilateral BKA, left arm nodules/lesions, Dialysis cath in place, right lip/face dried blood  Skin: no rashes, warm and dry Neurological: AAO x3 with spanish interpretor   Vital Signs:  BP 101/72   Pulse (!) 105   Temp (!) 97.5 F (36.4 C) (Oral)   Resp 17   Ht 4' 11 (1.499 m)   Wt 46.3 kg   SpO2 (!) 88%   BMI 20.62 kg/m  Pain Scale: 0-10 POSS *See Group Information*: 1-Acceptable,Awake and alert Pain Score: 0-No pain  SpO2: SpO2: (!) 88 % O2 Device:SpO2: (!) 88 % O2 Flow Rate: .O2 Flow Rate (L/min): 7 L/min  IO: Intake/output summary:  Intake/Output Summary (Last 24 hours) at 02/21/2024 1253 Last data filed at 02/21/2024 1200 Gross per 24 hour  Intake 701.38 ml  Output --  Net 701.38 ml    LBM: Last BM Date : 02/20/24 Baseline Weight: Weight: 47.6 kg Most recent weight: Weight: 46.3 kg      Palliative Assessment/Data:    Advanced Care Planning:   Primary Decision Maker: PATIENT  Code Status/Advance Care Planning: Full code  A discussion was had today regarding advanced directives. Concepts specific to code status, artifical feeding and hydration, continued IV antibiotics and rehospitalization was had.  The difference between a aggressive medical intervention path and a palliative comfort care path was discussed.   Patient clear and his expressed wishes to continue  with aggressive medical interventions.  Assessment & Plan:   SUMMARY OF RECOMMENDATIONS   Full Code-as confirmed by patient Continue with current plan of care per medical team. Extensive goals of care discussions with patient with use of Spanish interpreter.  He is clear and expressed wishes to continue to treat the treatable aggressively allow him every opportunity to stabilize mentioning his goal is to return to [place].  I shared with patient great concern for sudden medical emergency and he is instability to be able to return back to [place] at this time.  Patient verbalized understanding again expressing those are his wishes and he does not wish to speak of anything differently.  Patient does not have a documented advanced directive however shares that he does not have any family outside of [place].  States in the event he is unable to speak for himself his close friend Alvin Daniels would be his medical decision-maker and have his best interest regarding complex decisions. PMT will continue to support and follow as needed. Please call team line or secure chat with urgent unmet palliative needs.  Symptom Management:  Per Attending  Palliative Prophylaxis:  Aspiration, Bowel Regimen, Delirium Protocol, Frequent Pain Assessment, Oral Care, Palliative Wound Care, and Turn Reposition  Additional Recommendations (Limitations, Scope, Preferences): Full Scope Treatment  Psycho-social/Spiritual:  Desire for further Chaplaincy support: no Additional Recommendations: Education on Hospice  Prognosis:  Unable to determine (Poor)  Discharge Planning:  To Be Determined pending hospital trajectory.  Discussed with: RN  Patient expressed understanding and was in agreement with this plan.   I personally spent a total of 75 minutes in the care of the patient today including preparing to see the patient, getting/reviewing separately obtained history, performing a medically appropriate  exam/evaluation, counseling and educating, referring and communicating with other health care professionals, documenting clinical information in the EHR, independently interpreting results, and communicating results. Visit consisted of counseling and education dealing with the complex and emotionally intense issues of symptom management and palliative care in the setting of serious and potentially life-threatening illness.  Signed by:  Levon Borer, AGPCNP-BC [facility]   Phone: [phone] Pager: [phone] Amion: GEANNIE Freud   Thank you for allowing the Palliative Medicine Team to assist in the care of this patient. Please utilize secure chat with additional questions, if there is no response within 30 minutes please call the above phone number. Palliative Medicine Team providers are available by phone from 7am to 5pm daily and can be reached through the team cell phone.  Should this patient require assistance outside of these hours, please call the patient's attending physician.  *Please note that this is a verbal dictation therefore any spelling or grammatical errors are due to the Dragon [employer] system interpretation.

## 2024-02-21 NOTE — Progress Notes (Addendum)
 Pt Alvin Daniels 53, attempted to get patient to eat and drink pt was reluctant to drink or eat had a few sips and repeat blood sugar was 69. Had patient drink more, and is already on D10 @ 20 cc/jr will recheck in about an hour, pt symptomatic  Rechecked Alvin Daniels 71 pt will not eat or drink anything else. Dr Vira paged 1/2 amp of D50 given

## 2024-02-21 NOTE — Progress Notes (Signed)
 1622 Pt BP low and complaining of chest pain 10/10 unable to describe interpreter used. NP made aware, EKG obtainable and New orders.

## 2024-02-21 NOTE — Significant Event (Signed)
 Rapid Response Event Note   Reason for Call :  Hypotensive 54/13  Initial Focused Assessment:  Patient is drowsy but easily responsive. He is warm and dry Lung sounds are coarse Heart tones regular  68/38  HR 81 HR 77  RR 14  O2 sat 89-90% on 2L    Interventions:  2L NS bolus O2 increased to 6L [place] Levophed  started at 2mcg increased to 10mcg  Transferred to 3M05    Plan of Care:     Event Summary:   MD Notified:  Call Time: 0754 Arrival Time: 9241 End Time: 0915  Elvin [place], RN

## 2024-02-21 NOTE — Consult Note (Signed)
 NAMEBraison Daniels, MRN:  [id number], DOB:  1971/01/28, LOS: 68 ADMISSION DATE:  12/14/2023, CONSULTATION DATE:  02/21/2024 REFERRING MD:  TRH, CHIEF COMPLAINT:  Shock   History of Present Illness:  Alvin Daniels is a 53 y.o.male with extensive past medical history significant for HIV (CD4 count 254 10/09/23) ESRD on HD MWF, PAD s/p bilateral BKA and digit amputations to hand as well, type 2 diabetes, chronic HCV, anemia, chronic hypotension, neurotrophic keratopathy, chronic biliary dilatation, recent MRSA bacteremia and endocarditis, with osteomyelitis of left fourth finger status post amputation on 10/09/2023 (cultures positive for E. coli and Streptococcus anginosus) initially presented to [facility] 12/14/2023 complaining of right hip pain.  Workup revealed a L4-L5 discitis/osteomyelitis with right psoas abscess, underwent IR disc aspiration 01/07/2024 with cultures growing MRSA.  Started on 6-week course of IV vancomyci and not deemed a surgical candidate.   Mid morning 10/11 patient was seen with significant change in mental status associated with hypoglycemia and hypotension.  Assessment concerning for septic shock prompting PCCM consult for further management and transfer to ICU.  Vasopressor started.  Pertinent  Medical History  HIV (CD4 count 254 10/09/23) ESRD on HD MWF, PAD s/p bilateral BKA and digit amputations to hand as well, type 2 diabetes, chronic HCV, anemia, chronic hypotension, neurotrophic keratopathy, chronic biliary dilatation, recent MRSA bacteremia and endocarditis, with osteomyelitis of left fourth finger status post amputation on 10/09/2023 (cultures positive for E. coli and Streptococcus anginosus)   Significant Hospital Events: Including procedures, antibiotic start and stop dates in addition to other pertinent events   8/3 presented complaining of right hip pain.  Workup revealed a L4-L5 discitis/osteomyelitis with right psoas  abscess,  8/27 underwent IR disc aspiration with cultures growing MRSA.  10/11 developed altered mental status in the setting of hypotension and hypoglycemia, concerns for developing septic shock  Interim History / Subjective:  Seen lying in bed lethargic but will open eyes to physical stimuli  Objective    Blood pressure (!) 88/60, pulse 77, temperature 97.6 F (36.4 C), temperature source Oral, resp. rate 15, height 4' 11 (1.499 m), weight 46.6 kg, SpO2 98%.        Intake/Output Summary (Last 24 hours) at 02/21/2024 0815 Last data filed at 02/20/2024 2043 Gross per 24 hour  Intake 60 ml  Output --  Net 60 ml   Filed Weights   02/17/24 0819 02/17/24 1154 02/19/24 0834  Weight: 41.7 kg 43.9 kg 46.6 kg    Examination: General: Acute on chronic severely deconditioned cachectic middle-aged male lying lethargic but arousable HEENT: [place]/AT, MM pink/moist, PERRL,  Neuro: Opens eyes to physical stimuli, language barrier CV: s1s2 regular rate and rhythm, no murmur, rubs, or gallops,  PULM: Coarse breath sounds bilaterally, on 2 L nasal cannula with oxygen saturations 88 to 90%, increased work of breathing GI: soft, bowel sounds active in all 4 quadrants, non-tender, non-distended Extremities: warm/dry, no edema, bilateral BKA Skin: no rashes or lesions   Resolved problem list   Assessment and Plan  Concern for evolving septic shock -Mid morning 10/11 patient was seen with altered mental status, hypotension, and hypoglycemia.  With newly developed bilateral coarse breath sounds concerning for potential aspiration pneumonia versus daptomycin eosinophilic pneumonia P: Transfer to ICU for ongoing care Vasopressor support for MAP goal greater than 65 ID recommending transition antibiotics back to vancomycin  Starting IV Zosyn  for potential aspiration coverage Trend lactic acid  Once stable consider CT chest  Repeat echocardiogram Repeat blood cultures  Known L4-L5  discitis/osteomyelitis Bilateral right and left psoas abscesses -Patient presented with right hip pain in the setting of recent MRSA bacteremia.  Underwent multiple CTs and MRIs during initial workup. Underwent L4-L5 disc aspiration by IR on 01/07/2024 with cultures growing MRSA. Has since completed 6 weeks of vancomycin  on 02/18/2024  -MRI lumbar spine (10/8) showed interval progression of L4-L5 discitis/osteomyelitis with subtotal vertebral body destruction, severe multilevel spinal stenosis, new multiloculated dorsal epidural abscess extending from L4 to L1-L2, and additional new right paraspinal abscesses, with no significant change in right psoas muscle abscesses - Given progression and continued symptomatology despite 6 weeks of IV vancomycin , ID recommended revision of antibiotics to daptomycin as well as consultation with neurosurgery for possible surgical intervention, noted neurosurgery's evaluation patient still deemed a poor surgical candidate P: Transition antibiotics back to vancomycin  per ID  Supportive care   Intermittent hypoglycemia - Started on D10 drip but hypoglycemic episodes persist - Reported poor appetite due to dislike of hospital food P: Every 2 hour blood sugar checks Consider resumption of D10 drip if hypoglycemia persist  Diarrhea - Per handoff it appears diarrhea started 10/8 and felt secondary to scheduled bowel regiment and prolonged antibiotic therapy, low suspicion for C. Difficile P: ID following and will determine clinical benefit of obtaining C. difficile culture Supportive care Frequent [facility]  ESRD on hemodialysis MWF P: Management per nephrology May need to consider transition to CRRT given current hemodynamics will defer to nephrology  Chronic hypotension P: Given concern for aspiration event hold oral midodrine  Pressors as above  Anemia P: Trend CBC Transfuse per protocol Hemoglobin goal greater than 7  HIV - Most recent CD4 count 254  as of May 2025 P: Primary management per ID Continue Biktarvy  as able may need to hold given n.p.o. status  PAD s/p bilateral BKA P: Supportive care  Neurotrophic keratopathy of the right eye -Was evaluated by ophthalmology on 01/08/2024 P: Continue Moxif  Chronic intrahepatic/extrahepatic biliary dilatation  P: Supportive care  Hypothermia   Labs   CBC: Recent Labs  Lab 02/14/24 0846 02/16/24 0712 02/17/24 0840 02/19/24 0852 02/21/24 0422  WBC 6.8 6.2 8.1 6.8 9.3  NEUTROABS 5.3  --   --  5.6  --   HGB 10.0* 10.8* 10.1* 9.9* 8.0*  HCT 31.7* 33.9* 31.4* 30.0* 24.3*  MCV 99.4 99.1 99.7 98.4 98.0  PLT 108* 95* 90* 82* 51*    Basic Metabolic Panel: Recent Labs  Lab 02/14/24 0847 02/16/24 0712 02/17/24 0840 02/19/24 0852 02/21/24 0422  NA 128* 128* 129* 126* 127*  K 4.7 4.8 5.4* 4.5 3.6  CL 93* 94* 93* 91* 91*  CO2 24 24 22 24 24   GLUCOSE 88 97 84 66* 115*  BUN 38* 34* 48* 46* 35*  CREATININE 3.11* 3.09* 3.60* 3.11* 2.51*  CALCIUM  9.2 9.0 9.0 8.5* 8.0*  PHOS 3.7 3.6 4.9*  --  3.2   GFR: Estimated Creatinine Clearance: 22.4 mL/min (A) (by C-G formula based on SCr of 2.51 mg/dL (H)). Recent Labs  Lab 02/16/24 0712 02/17/24 0840 02/19/24 0852 02/21/24 0422  WBC 6.2 8.1 6.8 9.3    Liver Function Tests: Recent Labs  Lab 02/14/24 0847 02/16/24 0712 02/17/24 0840 02/21/24 0422  ALBUMIN  2.4* 2.2* 2.4* 2.0*   No results for input(s): LIPASE, AMYLASE in the last 168 hours. No results for input(s): AMMONIA in the last 168 hours.  ABG    Component Value Date/Time   PHART 7.460 (H) 03/03/2016 1439   PCO2ART 36.9  03/03/2016 1439   PO2ART 34.0 (LL) 03/03/2016 1439   HCO3 26.2 03/03/2016 1439   TCO2 15 (L) 09/01/2023 0805   O2SAT 69.0 03/03/2016 1439     Coagulation Profile: No results for input(s): INR, PROTIME in the last 168 hours.  Cardiac Enzymes: Recent Labs  Lab 02/18/24 1835  CKTOTAL 35*    HbA1C: Hemoglobin A1C   Date/Time Value Ref Range Status  07/17/2023 04:28 PM 10.0 (A) 4.0 - 5.6 % Final  01/29/2023 03:22 PM 8.5 (A) 4.0 - 5.6 % Final   HbA1c, POC (controlled diabetic range)  Date/Time Value Ref Range Status  09/28/2018 02:21 PM 6.8 0.0 - 7.0 % Final   Hgb A1c MFr Bld  Date/Time Value Ref Range Status  01/28/2024 04:00 AM 5.9 (H) 4.8 - 5.6 % Final    Comment:    (NOTE) Diagnosis of Diabetes The following HbA1c ranges recommended by the American Diabetes Association (ADA) may be used as an aid in the diagnosis of diabetes mellitus.  Hemoglobin             Suggested A1C NGSP%              Diagnosis  <5.7                   Non Diabetic  5.7-6.4                Pre-Diabetic  >6.4                   Diabetic  <7.0                   Glycemic control for                       adults with diabetes.    08/01/2022 11:53 PM 10.7 (H) 4.8 - 5.6 % Final    Comment:    (NOTE)         Prediabetes: 5.7 - 6.4         Diabetes: >6.4         Glycemic control for adults with diabetes: <7.0     CBG: Recent Labs  Lab 02/20/24 2051 02/21/24 0021 02/21/24 0126 02/21/24 0331 02/21/24 0739  GLUCAP 103* 53* 69* 71 45*    Review of Systems:   Unable to assess   Past Medical History:  He,  has a past medical history of Acute bilateral low back pain (01/03/2021), AIDS ([name]) (11/22/2014), Back pain (06/09/2019), BKA stump complication ([name]) (06/09/2019), Chronic diarrhea, Chronic hepatitis C without hepatic coma ([name]) (11/22/2014), 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[name]), Corneal ulcer (12/18/2022), 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   Surgical History:   Past Surgical History:  Procedure Laterality Date   AMPUTATION Left 04/20/2014   Procedure: 3rd toe amputation, 4th Toe Amputation,  5th Toe Amputation;  Surgeon: Jerona Harden GAILS, MD;  Location: [facility] OR;  Service: Orthopedics;  Laterality: Left;   AMPUTATION Left 05/02/2014   Procedure: Midfoot Amputation;  Surgeon: Jerona Harden GAILS, MD;  Location: [facility] OR;  Service: Orthopedics;  Laterality: Left;   AMPUTATION Left 06/17/2014   Procedure: AMPUTATION BELOW KNEE;  Surgeon: Jerona  Harden GAILS, MD;  Location: [facility] OR;  Service: Orthopedics;  Laterality: Left;   AMPUTATION Right 09/04/2018   Procedure: RIGHT BELOW KNEE AMPUTATION;  Surgeon: Harden Jerona GAILS, MD;  Location: [facility] OR;  Service: Orthopedics;  Laterality: Right;  RIGHT BELOW KNEE AMPUTATION   AMPUTATION FINGER Left 10/09/2023   Procedure: AMPUTATION, FINGER;  Surgeon: Romona Harari, MD;  Location: [facility] OR;  Service: Orthopedics;  Laterality: Left;  Left Ring Finger amputation   AV FISTULA PLACEMENT Left    AV FISTULA PLACEMENT Left 05/10/2016   Procedure: Creation Left Arm Brachiocephalic Arteriovenous Fistula and Ligation of Radiocephalic Fistula;  Surgeon: Lonni GORMAN Blade, MD;  Location: [facility] OR;  Service: Vascular;  Laterality: Left;   DIALYSIS/PERMA CATHETER INSERTION N/A 09/01/2023   Procedure: DIALYSIS/PERMA CATHETER INSERTION;  Surgeon: Melia Lynwood ORN, MD;  Location: [facility] INVASIVE CV LAB;  Service: Cardiovascular;  Laterality: N/A;   FEMUR IM NAIL Left 08/17/2016   Procedure: INTRAMEDULLARY (IM) RETROGRADE FEMORAL NAILING;  Surgeon: Oneil JAYSON Herald, MD;  Location: [facility] OR;  Service: Orthopedics;  Laterality: Left;   FOOT AMPUTATION THROUGH ANKLE Left 12/'21/2015   midfoot   IR AV DIALY SHUNT INTRO NEEDLE/INTRACATH INITIAL W/PTA/IMG LEFT  04/17/2023   IR FLUORO GUIDE CV LINE RIGHT  10/14/2023   IR FLUORO GUIDE CV LINE RIGHT  11/11/2023   IR GENERIC  HISTORICAL Left 05/01/2016   IR THROMBECTOMY AV FISTULA W/THROMBOLYSIS/PTA INC/SHUNT/IMG LEFT 05/01/2016 Toribio Faes, MD [facility]   IR GENERIC HISTORICAL  05/01/2016   IR US  GUIDE VASC ACCESS LEFT 05/01/2016 Toribio Faes, MD [facility]   IR GENERIC HISTORICAL  05/07/2016   IR FLUORO GUIDE CV LINE RIGHT 05/07/2016 Ami Bellman, DO [facility]   IR GENERIC HISTORICAL  05/07/2016   IR US  GUIDE VASC ACCESS RIGHT 05/07/2016 Ami Bellman, DO [facility]   IR GENERIC HISTORICAL  05/22/2016   IR US  GUIDE VASC ACCESS RIGHT 05/22/2016 Ozell Specking, MD [facility]   IR GENERIC HISTORICAL  05/22/2016   IR FLUORO GUIDE CV LINE RIGHT 05/22/2016 Ozell Specking, MD [facility]   IR LUMBAR DISC ASPIRATION W/IMG GUIDE  01/07/2024   IR REMOVAL TUN CV CATH W/O FL  08/21/2016   IR REMOVAL TUN CV CATH W/O FL  10/12/2023   IR US  GUIDE VASC ACCESS LEFT  04/17/2023   IR US  GUIDE VASC ACCESS RIGHT  10/14/2023   PERIPHERAL VASCULAR CATHETERIZATION Left 05/09/2016   Procedure: A/V Fistulagram;  Surgeon: Lonni GORMAN Blade, MD;  Location: [facility] INVASIVE CV LAB;  Service: Cardiovascular;  Laterality: Left;  arm   TEE WITHOUT CARDIOVERSION N/A 06/22/2018   Procedure: TRANSESOPHAGEAL ECHOCARDIOGRAM (TEE);  Surgeon: Jeffrie Oneil JAYSON, MD;  Location: [facility] ENDOSCOPY;  Service: Cardiovascular;  Laterality: N/A;   TRANSESOPHAGEAL ECHOCARDIOGRAM (CATH LAB) N/A 10/15/2023   Procedure: TRANSESOPHAGEAL ECHOCARDIOGRAM;  Surgeon: Rolan Ezra GORMAN, MD;  Location: [facility] INVASIVE CV LAB;  Service: Cardiovascular;  Laterality: N/A;     Social History:   reports that he has never smoked. He has never used smokeless tobacco. He reports that he does not drink alcohol  and does not use drugs.   Family History:  His family history includes Diabetes in his father and mother.   Allergies No Known Allergies   Home Medications  Prior to Admission medications   Medication Sig Start Date End Date Taking? Authorizing Provider   bictegravir-emtricitabine -tenofovir  AF (BIKTARVY ) 50-200-25 MG TABS tablet Take 1 tablet by mouth daily. 04/16/23  Yes Fleeta Rothman, Jomarie SAILOR, MD  insulin  aspart protamine  -  aspart (NOVOLOG  70/30 FLEXPEN) (70-30) 100 UNIT/ML FlexPen Inject 20 Units into the skin 2 (two) times daily. Patient taking differently: Inject 10 Units into the skin 2 (two) times daily. 07/17/23  Yes Tanda Bleacher, MD  ZYPITAMAG  4 MG TABS Take by mouth. 11/26/23  Yes [provider]  gabapentin  (NEURONTIN ) 100 MG capsule Take 1 capsule (100 mg total) by mouth at bedtime. Patient not taking: Reported on 12/16/2023 10/17/23 01/15/24  [place], Eric J, DO  HYDROcodone -acetaminophen  (NORCO/VICODIN) 5-325 MG tablet Take 1 tablet by mouth every 6 (six) hours as needed for severe pain (pain score 7-10) or moderate pain (pain score 4-6). Patient not taking: Reported on 12/15/2023 10/17/23   [place], Camellia PARAS, DO  midodrine  (PROAMATINE ) 10 MG tablet Take 1 tablet (10 mg total) by mouth 3 (three) times daily as needed for low blood pressure, SBP</=100. may take 30 min prior to dialysis and mid treatment as needed. Patient not taking: Reported on 12/16/2023 09/05/23     bictegravir-emtricitabine -tenofovir  AF (BIKTARVY ) 50-200-25 MG TABS tablet Take 1 tablet by mouth daily. 01/02/22   Fleeta Kathie Jomarie LOISE, MD     Critical care time:   CRITICAL CARE Performed by: Kasch Borquez D. Harris   Total critical care time: 50 minutes  Critical care time was exclusive of separately billable procedures and treating other patients.  Critical care was necessary to treat or prevent imminent or life-threatening deterioration.  Critical care was time spent personally by me on the following activities: development of treatment plan with patient and/or surrogate as well as nursing, discussions with consultants, evaluation of patient's response to treatment, examination of patient, obtaining history from patient or surrogate, ordering and performing treatments and  interventions, ordering and review of laboratory studies, ordering and review of radiographic studies, pulse oximetry and re-evaluation of patient's condition.  Cedar Ditullio D. Harris, NP-C [facility] Personal contact information can be found on Amion  If no contact or response made please call [phone] 02/21/2024, 9:31 AM

## 2024-02-21 NOTE — Procedures (Addendum)
 Central Venous Catheter Insertion Procedure Note  Alvin Daniels  [id number]  [birth year]  Date:02/21/24  Time:6:01 PM   Provider Performing:Namita Yearwood D. Harris   Procedure: Insertion of Non-tunneled Central Venous [phone]) with US  guidance (23062)   Indication(s) Medication administration and Difficult access  Consent Unable to obtain consent due to emergent nature of procedure.  Anesthesia Topical only with 1% lidocaine    Timeout Verified patient identification, verified procedure, site/side was marked, verified correct patient position, special equipment/implants available, medications/allergies/relevant history reviewed, required imaging and test results available.  Sterile Technique Maximal sterile technique including full sterile barrier drape, hand hygiene, sterile gown, sterile gloves, mask, hair covering, sterile ultrasound probe cover (if used).  Procedure Description Area of catheter insertion was cleaned with chlorhexidine  and draped in sterile fashion.  With real-time ultrasound guidance a central venous catheter was placed into the right femoral vein. Nonpulsatile blood flow and easy flushing noted in all ports.  The catheter was sutured in place and sterile dressing applied.  Complications/Tolerance None; patient tolerated the procedure well. Chest X-ray is ordered to verify placement for internal jugular or subclavian cannulation.   Chest x-ray is not ordered for femoral cannulation.  EBL Minimal  Specimen(s) None  Machai Desmith D. Harris, NP-C [facility] Personal contact information can be found on Amion  If no contact or response made please call [phone] 02/21/2024, 6:03 PM

## 2024-02-21 NOTE — Procedures (Signed)
 Arterial Catheter Insertion Procedure Note  Alvin Daniels  [id number]  12-13-1970  Date:02/21/24  Time:6:04 PM    Provider Performing: Benton D. Harris    Procedure: Insertion of Arterial Line (63379) with US  guidance (23062)   Indication(s) Blood pressure monitoring and/or need for frequent ABGs  Consent Unable to obtain consent due to emergent nature of procedure.  Anesthesia None   Time Out Verified patient identification, verified procedure, site/side was marked, verified correct patient position, special equipment/implants available, medications/allergies/relevant history reviewed, required imaging and test results available.   Sterile Technique Maximal sterile technique including full sterile barrier drape, hand hygiene, sterile gown, sterile gloves, mask, hair covering, sterile ultrasound probe cover (if used).   Procedure Description Area of catheter insertion was cleaned with chlorhexidine  and draped in sterile fashion. With real-time ultrasound guidance an arterial catheter was placed into the right femoral artery.  Appropriate arterial tracings confirmed on monitor.     Complications/Tolerance None; patient tolerated the procedure well.   EBL Minimal   Specimen(s) None  Tamela Elsayed D. Harris, NP-C  [facility] Personal contact information can be found on Amion  If no contact or response made please call [phone] 02/21/2024, 6:04 PM

## 2024-02-21 NOTE — Progress Notes (Signed)
 During checking in the patient after report, he was very sleepy, was not responding  with painful stimuli only but went back to sleep within few second, was not able to open his eyes, CBG was only 45mg /dl only, PRN 49% dextrose  given, BP was critical, notified to charge RN, Rapid response and attending MD.   MD, RR was at bedside, fluid bolus started, PIV access opened, BP remained the same, ordered labs were sent, Chest x ray done at bedside, plan start levophed  and transfer to ICU.

## 2024-02-21 NOTE — Progress Notes (Signed)
 Brief PCCM progress note  While at bedside prepping to place arterial line patient suddenly became unresponsive with agonal respirations.  Respiratory arrest identified patient was emergently intubated, see separate procedure note  Drevon Plog D. Harris, NP-C [facility] Personal contact information can be found on Amion  If no contact or response made please call [phone] 02/21/2024, 5:59 PM

## 2024-02-21 NOTE — Progress Notes (Addendum)
  Progress Note   Patient: Alvin Daniels FMW:[id number] DOB: 1970/09/22 DOA: 12/14/2023     68 DOS: the patient was seen and examined on 02/21/2024   Brief hospital course:  Patient is a 53 year old male with PMHx of ESRD on HD (MWF), HIV (CD 4-54 on 5/29), T2DM, PAD status post bilateral BKA, chronic HCV, anemia of ESRD, chronic hypotension, hyponatremia, neurotrophic keratopathy, chronic biliary dilatation, recent MRSA bacteremia and endocarditis, with osteomyelitis of left fourth finger status post amputation on 10/09/2023 (cultures positive for E. coli and Streptococcus anginosus) who presented on 12/14/2023 complaining of right hip with ambulation using his prosthetic leg.  Imaging was concerning for L4-L5 discitis/osteomyelitis with right psoas abscess.  He underwent L4-L5 disc aspiration by IR on 01/07/2024 with cultures growing MRSA.  He was started on a 6-week course of IV vancomycin  (EOT 02/18/2024) per ID.  Not deemed surgical candidate by orthopedics. Repeat MRI lumbar spine on (10/8) showed  interval progression of L4-L5 discitis/osteomyelitis with subtotal vertebral body destruction, severe multilevel spinal stenosis, new multiloculated dorsal epidural abscess extending from L4 to L1-L2, and additional new right paraspinal abscesses, with no significant change in right psoas muscle abscesses. Given progression and continued symptomatology despite 6 weeks of IV vancomycin , ID recommended revision of antibiotics to daptomycin.  02/21/2024 - Notified by RN staff of hypoglycemia to 45 and hypotension to 54/13.  Rapid response was called. 1L NS bolus started and 1 amp D50 pushed with BG improvement to 173.  - On chart review, noted patient was hypoxic to 80s on RA and was placed on 2L [place] overnight.  - On exam, patient was significantly lethargic but responsive to pain. After a few minutes, he was a bit more responsive to verbal stimuli, however, far from baseline. - Vitals on my  arrival showed HR 78, RR 20, BP 61/14 (MAP 28) SpO2 90% on 2 L [place].  Temp 97.9 F on Bair hugger. - Physical exam notable for difficult to palpate radial pulse, coarse anterior breath sounds bilaterally, abdomen soft NTND, AMS - Ordered STAT CXR due to concern for aspiration, lactic acid, repeat CBC and RFP. Will likely need repeat BCx and echo. - Started Levophed  drip - Started IV Zosyn  - PCCM consulted due to concern for progression to septic shock and possible aspiration pneumonia.  PCCM accepted patient transferred to ICU, which was greatly appreciated - Further management per primary ICU team  Total critical care time: 65   Critical care time was exclusive of separately billable procedures and treating other patients. Critical care was necessary to treat or prevent imminent or life-threatening deterioration.   Critical care was time spent personally by me on the following activities: development of treatment plan with patient and/or surrogate as well as nursing, discussions with consultants, evaluation of patient's response to treatment, examination of patient, obtaining history from patient or surrogate, ordering and performing treatments and interventions, ordering and review of laboratory studies, ordering and review of radiographic studies, pulse oximetry and re-evaluation of patient's condition.   Problem List  # L4-L5 discitis/osteomyelitis  # Bilateral right and left psoas abscesses # Diarrhea # Hypothermia # Episodic hypoglycemia # ESRD on hemodialysis (MWF) # Chronic hypotension # Anemia of ESRD # Hyponatremia # HIV # Chronic HCV # PAD status post bilateral BKA # Neurotrophic keratopathy of the right eye #Chronic intrahepatic/extrahepatic biliary dilatation   Duffy Larch, MD [facility] 02/21/2024 9:16 AM

## 2024-02-21 NOTE — Procedures (Signed)
 Spoke to nurse for report for pt, set up transport, pt never came. Called unit and was informed that charge nurse and pt's nurse were about to call rapid response for pt.

## 2024-02-21 NOTE — Procedures (Signed)
 Intubation Procedure Note  Alvin Daniels  [id number]  24-Sep-1970  Date:02/21/24  Time:5:16 PM   Provider Performing:Mauri Temkin    Procedure: Intubation (31500)  Indication(s) Respiratory Failure  Consent Unable to obtain consent due to emergent nature of procedure.   Anesthesia Etomidate, Versed , and Fentanyl    Time Out Verified patient identification, verified procedure, site/side was marked, verified correct patient position, special equipment/implants available, medications/allergies/relevant history reviewed, required imaging and test results available.   Sterile Technique Usual hand hygeine, masks, and gloves were used   Procedure Description Patient positioned in bed supine.  Sedation given as noted above.  Patient was intubated with endotracheal tube using Glidescope.  View was Grade 1 full glottis .  Number of attempts was 1.  Colorimetric CO2 detector was consistent with tracheal placement.   Complications/Tolerance None; patient tolerated the procedure well. Chest X-ray is ordered to verify placement.   EBL Minimal   Specimen(s) None  Tamela Stakes, MD  Attending Physician, Critical Care Medicine [facility] See Amion for pager If no response to pager, please call [phone] until 7pm After 7pm, Please call E-link [phone]

## 2024-02-21 NOTE — Progress Notes (Signed)
 [facility]    Date of Admission:  12/14/2023      ID: Alvin Daniels is a 53 y.o. 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 Now improved. Patchy infiltrates on cxr. He was started on vanco and piptazo for concern for hcap. On 2L [place]. He denies pain. Wants to eat; need help with phone  Medications:   (feeding supplement) PROSource Plus  30 mL Oral Daily   acetaminophen   1,000 mg Oral TID   acyclovir ointment  1 Application Topical 6 X Daily   artificial tears   Right Eye QID   bictegravir-emtricitabine -tenofovir  AF  1 tablet Oral Daily   Chlorhexidine  Gluconate Cloth  6 each Topical Q0600   darbepoetin (ARANESP ) injection - DIALYSIS  150 mcg Subcutaneous Q Fri-1800   dextrose        feeding supplement (NEPRO CARB STEADY)  237 mL Oral BID AC & HS   fentaNYL   1 patch Transdermal Q72H   gabapentin   100 mg Oral TID   gatifloxacin   1 drop Right Eye BID   heparin   5,000 Units Subcutaneous Q8H   lidocaine   2 patch Transdermal Q24H   methocarbamol   500 mg Oral TID   midodrine   10 mg Oral TID WC   midodrine   10 mg Oral Q T,Th,Sa-HD   multivitamin  1 tablet Oral QHS   pantoprazole   40 mg Oral BID AC   sevelamer  carbonate  800 mg Oral TID WC    Objective: Vital signs in last 24 hours: Temp:  [93 F (33.9 C)-97.9 F (36.6 C)] 97.9 F (36.6 C) (10/11 0814) Pulse Rate:  [65-88] 88 (10/11  0839) Resp:  [11-20] 11 (10/11 0839) BP: (50-126)/(13-82) 91/55 (10/11 0839) SpO2:  [90 %-98 %] 91 % (10/11 0839)  Physical Exam  Constitutional: He is oriented to person, place, and time. He appears well-developed and well-nourished. No distress.  HENT:  Mouth/Throat: Oropharynx is clear and moist. No oropharyngeal exudate.  Cardiovascular: Normal rate, regular rhythm and normal heart sounds. Exam reveals no gallop and no friction rub.  No murmur heard.  Pulmonary/Chest: Effort normal and breath sounds normal. No respiratory distress. He has no wheezes.  Abdominal: Soft. Bowel sounds are normal. He exhibits no distension. There is no tenderness.  Lymphadenopathy:  He has no cervical adenopathy.  Neurological: He is alert and oriented to person, place, and time.  Skin: Skin is warm and dry. No rash noted. No erythema.  Psychiatric: He has a normal mood and affect. His behavior is normal.    Lab Results Recent Labs    02/19/24 0852 02/21/24 0422  WBC 6.8 9.3  HGB 9.9* 8.0*  HCT 30.0* 24.3*  NA 126* 127*  K 4.5 3.6  CL 91* 91*  CO2 24 24  BUN 46* 35*  CREATININE 3.11* 2.51*   Liver Panel Recent Labs    02/21/24 0422  ALBUMIN  2.0*   Sedimentation  Rate Recent Labs    02/18/24 1356  ESRSEDRATE 20*   C-Reactive Protein Recent Labs    02/18/24 1356  CRP 5.1*    Microbiology: reviewed Studies/Results: DG CHEST PORT 1 VIEW Result Date: 02/21/2024 EXAM: 1 VIEW(S) XRAY OF THE CHEST 02/21/2024 08:22:29 AM COMPARISON: 11/10/2023 CLINICAL HISTORY: SOB (shortness of breath). FINDINGS: LINES, TUBES AND DEVICES: There is a right-sided dialysis catheter with the tip in the right atrium. LUNGS AND PLEURA: Diffuse bilateral upper and lower lung zone opacities are identified. No pulmonary edema. No pleural effusion. No pneumothorax. HEART AND MEDIASTINUM: No acute abnormality of the cardiac and mediastinal silhouettes. BONES AND SOFT TISSUES: Osseous structures are unremarkable.  IMPRESSION: 1. Diffuse bilateral upper and lower lung zone opacities. Imaging findings are favored to represent diffuse interstitial and alveolar edema. Multifocal infection not excluded. 2. Right-sided dialysis catheter with tip in the right atrium. Electronically signed by: Waddell Calk MD 02/21/2024 08:35 AM EDT RP Workstation: [id number]   IMAGING:MRI IMPRESSION: 1. Progressive L4-L5 discitis osteomyelitis since late August: 2. Subtotal L4 vertebral body destruction now, with increased severe multifactorial L4 level spinal stenosis. 3. New multiloculated dorsal epidural abscess tracking up from L4 to the L1-L2 level, asymmetric to the right, and with associated new multilevel spinal stenosis and also involving the right L1, L2, and L3 neural foramina. 4. Additional new posterior right paraspinal abscesses at L4-L5, individually up to 1.5 cm. Right psoas muscle abscesses not significantly changed. 5. Severe multifactorial spinal canal stenosis at L4, progressed. Epidural abscess-related stenosis is mild at L1-L2, moderate at L2-L3, and severe at L3. 6.  Underlying renal atrophy, hemosiderosis.  Assessment/Plan: Concern for new pneumonia ? = continue on vancomycin  and piptazo  Hx of MRSA discitis/OM, dorsal epidural abscess , progressed despite iv abtx course = will recommend to get IR to repeat aspirate to see if having Vanco Resistance and/or Dapto resistance. He may not be a candidate for surgical debridement per surgery. Will see if having 2nd bacterial infection, but suspect this is still all MRSA infection.  ESRD on hd = continue on HD if BP tolerates vs CRRT  HIV disease = continue on biktarvy    Hypothermia= appears resolved  evaluation of this patient requires complex antimicrobial therapy evaluation and counseling and isolation needs for disease transmission risk assessment and mitigation.    [facility] Pager:  [phone]  02/21/2024, 8:49 AM

## 2024-02-21 NOTE — Progress Notes (Signed)
 [facility] Progress Note   Subjective:    Seen in room. RRT this AM with hypotension, decreased responsiveness, hypoglycemia.  1L bolus, 1 amp D50, D10 gtt.  On norepi now.  Appears slightly better.  Vanc/ Zosyn  started, lactate, repeat blood cultures.    Objective Vitals:   02/21/24 0859 02/21/24 0900 02/21/24 1000 02/21/24 1005  BP:   (!) 111/34 (!) 117/45  Pulse:  (!) 112 (!) 110 (!) 111  Resp:  13 11 14   Temp:  97.7 F (36.5 C)    TempSrc:  Oral    SpO2:  90% 90% 93%  Weight: 46.3 kg     Height:       Physical Exam General: Chronically ill, frail, nad  Heart: RRR Lungs: CTAB Abdomen: soft Extremities: no LE edema, B BKA, some necrotic appearing ulcerations on L hand Dialysis Access: TDC, some reddened skin on the dressing  Filed Weights   02/17/24 1154 02/19/24 0834 02/21/24 0859  Weight: 43.9 kg 46.6 kg 46.3 kg    Intake/Output Summary (Last 24 hours) at 02/21/2024 1022 Last data filed at 02/21/2024 0843 Gross per 24 hour  Intake 214.4 ml  Output --  Net 214.4 ml     Additional Objective Labs: Basic Metabolic Panel: Recent Labs  Lab 02/16/24 0712 02/17/24 0840 02/19/24 0852 02/21/24 0422  NA 128* 129* 126* 127*  K 4.8 5.4* 4.5 3.6  CL 94* 93* 91* 91*  CO2 24 22 24 24   GLUCOSE 97 84 66* 115*  BUN 34* 48* 46* 35*  CREATININE 3.09* 3.60* 3.11* 2.51*  CALCIUM  9.0 9.0 8.5* 8.0*  PHOS 3.6 4.9*  --  3.2   Liver Function Tests: Recent Labs  Lab 02/16/24 0712 02/17/24 0840 02/21/24 0422  ALBUMIN  2.2* 2.4* 2.0*   No results for input(s): LIPASE, AMYLASE in the last 168 hours. CBC: Recent Labs  Lab 02/16/24 0712 02/17/24 0840 02/19/24 0852 02/21/24 0422  WBC 6.2 8.1 6.8 9.3  NEUTROABS  --   --  5.6  --   HGB 10.8* 10.1* 9.9* 8.0*  HCT 33.9* 31.4* 30.0* 24.3*  MCV 99.1 99.7 98.4 98.0  PLT 95* 90* 82* 51*   Blood Culture    Component Value Date/Time   SDES NEEDLE ASPIRATE 01/07/2024 1612   SPECREQUEST DISC 01/07/2024  1612   CULT  01/07/2024 1612    FEW METHICILLIN RESISTANT STAPHYLOCOCCUS AUREUS NO ANAEROBES ISOLATED Performed at [facility], [address], [place], [place] [address]    REPTSTATUS 01/12/2024 FINAL 01/07/2024 1612    Cardiac Enzymes: Recent Labs  Lab 02/18/24 1835  CKTOTAL 35*   CBG: Recent Labs  Lab 02/21/24 0331 02/21/24 0739 02/21/24 0802 02/21/24 0821 02/21/24 0905  GLUCAP 71 45* 173* 133* 87   Iron  Studies: No results for input(s): IRON , TIBC, TRANSFERRIN, FERRITIN in the last 72 hours. Lab Results  Component Value Date   INR 1.1 01/07/2024   INR 1.1 12/15/2023   INR 1.2 10/08/2023     Medications:  sodium chloride      albumin  human     dextrose  20 mL/hr at 02/18/24 1233   norepinephrine  (LEVOPHED ) Adult infusion 10 mcg/min (02/21/24 0843)   piperacillin -tazobactam (ZOSYN )  IV     vancomycin      vancomycin       (feeding supplement) PROSource Plus  30 mL Oral Daily   acetaminophen   1,000 mg Oral TID   acyclovir ointment  1 Application Topical 6 X Daily   artificial tears  Right Eye QID   bictegravir-emtricitabine -tenofovir  AF  1 tablet Oral Daily   Chlorhexidine  Gluconate Cloth  6 each Topical Q0600   darbepoetin (ARANESP ) injection - DIALYSIS  150 mcg Subcutaneous Q Fri-1800   feeding supplement (NEPRO CARB STEADY)  237 mL Oral BID AC & HS   fentaNYL   1 patch Transdermal Q72H   gabapentin   100 mg Oral TID   gatifloxacin   1 drop Right Eye BID   heparin   5,000 Units Subcutaneous Q8H   lidocaine   2 patch Transdermal Q24H   methocarbamol   500 mg Oral TID   midodrine   10 mg Oral TID WC   midodrine   10 mg Oral Q T,Th,Sa-HD   multivitamin  1 tablet Oral QHS   pantoprazole   40 mg Oral BID AC   sevelamer  carbonate  800 mg Oral TID WC    Dialysis Orders: [facility] - MWF 3.5h  B400  45.5kg  TDC  Heparin  2000 Mircera 100 mcg - last dose 12/01/23 Calcitriol  1.0 mcg (on hold due to high Ca) Uses midodrine  10mg  TID + extra pre-HD    Assessment/Plan: 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 Has been on long course of IV Vancomycin . Repeat MRI 10/8 showing infection has progressed. ID changed antibiotic to IV daptomycin. NSGY does not think he is a surgical candidate and recommends IR involvement.  Pain management per primary  ESRD: Historically MWF schedule - on TTS this admit, prefers to stay on that schedule. Will hold off on dialysis today- either CRRT or IHD- will try to wean pressor today.  D/w bedside RN and PCCM MD. Shock: likely septic- hypoglycemic, intermittent hypothermia, cultures/ abx/ pressors.  Vanc/ Zosyn .  In 38M now. BP/volume: Chronic hypotension on mido 10mg  TID and extra pre-HD. Below EDW now.  Anemia of ESRD: Hgb 9.9 - continue Aranesp  150mcg q Friday. No IV iron  d/t infection. HIV: controlled on Biktarvy  BMD: CorrCa high, VDRA on hold. Phos on goal, on sevelamer  as binder.  Nutrition: Alb low, continue supps and diet has been liberalized. Debility: PT/OT as tolerated. Dispo: Very difficult placement. Noted patient is uninsured thus SNF is not an option.  No family or safe dispo plan at this time.  Almarie Bonine MD [facility] 02/21/2024,10:22 AM

## 2024-02-21 NOTE — Progress Notes (Signed)
 Transferred to ICU for concern of HCAP  Exam: BUE full strength BLE: b/l BKA, antigravity movement hip flexion  Plan: No neurosurgical intervention planned. Refer to Dr. Joshua for rationale

## 2024-02-22 ENCOUNTER — Inpatient Hospital Stay (HOSPITAL_COMMUNITY): Payer: MEDICAID

## 2024-02-22 DIAGNOSIS — L8990 Pressure ulcer of unspecified site, unspecified stage: Secondary | ICD-10-CM | POA: Insufficient documentation

## 2024-02-22 DIAGNOSIS — Z9911 Dependence on respirator [ventilator] status: Secondary | ICD-10-CM

## 2024-02-22 DIAGNOSIS — N189 Chronic kidney disease, unspecified: Secondary | ICD-10-CM

## 2024-02-22 DIAGNOSIS — J9690 Respiratory failure, unspecified, unspecified whether with hypoxia or hypercapnia: Secondary | ICD-10-CM

## 2024-02-22 LAB — GLUCOSE, CAPILLARY
Glucose-Capillary: 102 mg/dL — ABNORMAL HIGH (ref 70–99)
Glucose-Capillary: 47 mg/dL — ABNORMAL LOW (ref 70–99)
Glucose-Capillary: 114 mg/dL — ABNORMAL HIGH (ref 70–99)
Glucose-Capillary: 44 mg/dL — CL (ref 70–99)
Glucose-Capillary: 109 mg/dL — ABNORMAL HIGH (ref 70–99)
Glucose-Capillary: 67 mg/dL — ABNORMAL LOW (ref 70–99)
Glucose-Capillary: 75 mg/dL (ref 70–99)
Glucose-Capillary: 61 mg/dL — ABNORMAL LOW (ref 70–99)
Glucose-Capillary: 68 mg/dL — ABNORMAL LOW (ref 70–99)
Glucose-Capillary: 139 mg/dL — ABNORMAL HIGH (ref 70–99)
Glucose-Capillary: 94 mg/dL (ref 70–99)
Glucose-Capillary: 103 mg/dL — ABNORMAL HIGH (ref 70–99)

## 2024-02-22 LAB — RENAL FUNCTION PANEL
Albumin: 2.1 g/dL — ABNORMAL LOW (ref 3.5–5.0)
Anion gap: 11 (ref 5–15)
BUN: 35 mg/dL — ABNORMAL HIGH (ref 6–20)
CO2: 20 mmol/L — ABNORMAL LOW (ref 22–32)
Calcium: 7.6 mg/dL — ABNORMAL LOW (ref 8.9–10.3)
Chloride: 94 mmol/L — ABNORMAL LOW (ref 98–111)
Creatinine, Ser: 2.54 mg/dL — ABNORMAL HIGH (ref 0.61–1.24)
GFR, Estimated: 29 mL/min — ABNORMAL LOW (ref >60)
Glucose, Bld: 103 mg/dL — ABNORMAL HIGH (ref 70–99)
Phosphorus: 2.5 mg/dL (ref 2.5–4.6)
Potassium: 3.5 mmol/L (ref 3.5–5.1)
Sodium: 125 mmol/L — ABNORMAL LOW (ref 135–145)

## 2024-02-22 LAB — CBC WITH DIFFERENTIAL/PLATELET
Basophils Absolute: 0.2 K/uL — ABNORMAL HIGH (ref 0.0–0.1)
Basophils Relative: 3 %
Eosinophils Absolute: 0.1 K/uL (ref 0.0–0.5)
Eosinophils Relative: 2 %
HCT: 20.2 % — ABNORMAL LOW (ref 39.0–52.0)
Hemoglobin: 6.7 g/dL — CL (ref 13.0–17.0)
Lymphocytes Relative: 4 %
Lymphs Abs: 0.3 K/uL — ABNORMAL LOW (ref 0.7–4.0)
MCH: 32.5 pg (ref 26.0–34.0)
MCHC: 33.2 g/dL (ref 30.0–36.0)
MCV: 98.1 fL (ref 80.0–100.0)
Monocytes Absolute: 0.1 K/uL (ref 0.1–1.0)
Monocytes Relative: 2 %
Neutro Abs: 6.2 K/uL (ref 1.7–7.7)
Neutrophils Relative %: 89 %
Platelets: 33 K/uL — ABNORMAL LOW (ref 150–400)
RBC: 2.06 MIL/uL — ABNORMAL LOW (ref 4.22–5.81)
RDW: 19.9 % — ABNORMAL HIGH (ref 11.5–15.5)
WBC: 7 K/uL (ref 4.0–10.5)
nRBC: 0 % (ref 0.0–0.2)

## 2024-02-22 LAB — PREPARE RBC (CROSSMATCH)

## 2024-02-22 MED ORDER — VANCOMYCIN HCL 500 MG/100ML IV SOLN
500.0000 mg | INTRAVENOUS | Status: DC
  Administered 2024-02-22 – 2024-03-01 (×9): 500 mg via INTRAVENOUS
  Filled 2024-02-22 (×9): qty 100

## 2024-02-22 MED ORDER — SODIUM CHLORIDE 0.9% FLUSH
10.0000 mL | Freq: Two times a day (BID) | INTRAVENOUS | Status: DC
  Administered 2024-02-22 – 2024-02-29 (×11): 10 mL
  Administered 2024-03-01: 30 mL
  Administered 2024-03-01: 10 mL
  Administered 2024-03-02: 20 mL

## 2024-02-22 MED ORDER — IBUPROFEN 200 MG PO TABS: 600.0000 mg | ORAL_TABLET | Freq: Three times a day (TID) | ORAL | Status: DC | PRN

## 2024-02-22 MED ORDER — PRISMASOL BGK 4/2.5 32-4-2.5 MEQ/L EC SOLN: Status: DC

## 2024-02-22 MED ORDER — HEPARIN SODIUM (PORCINE) 1000 UNIT/ML DIALYSIS
1000.0000 [IU] | INTRAMUSCULAR | Status: DC | PRN
  Administered 2024-02-25: 3800 [IU] via INTRAVENOUS_CENTRAL
  Filled 2024-02-22: qty 3
  Filled 2024-02-22: qty 6
  Filled 2024-02-22: qty 1

## 2024-02-22 MED ORDER — DEXTROSE 50 % IV SOLN
25.0000 g | INTRAVENOUS | Status: AC
  Administered 2024-02-22: 25 g via INTRAVENOUS

## 2024-02-22 MED ORDER — MIDAZOLAM HCL 2 MG/2ML IJ SOLN
INTRAMUSCULAR | Status: AC
  Administered 2024-02-22: 2 mg via INTRAVENOUS
  Filled 2024-02-22: qty 2

## 2024-02-22 MED ORDER — MIDODRINE HCL 5 MG PO TABS: 10.0000 mg | ORAL_TABLET | ORAL | Status: DC

## 2024-02-22 MED ORDER — ACETAMINOPHEN 500 MG PO TABS
1000.0000 mg | ORAL_TABLET | Freq: Three times a day (TID) | ORAL | Status: DC
  Administered 2024-02-22 – 2024-02-29 (×22): 1000 mg
  Filled 2024-02-22 (×22): qty 2

## 2024-02-22 MED ORDER — MIDODRINE HCL 5 MG PO TABS
10.0000 mg | ORAL_TABLET | Freq: Three times a day (TID) | ORAL | Status: DC
  Administered 2024-02-22 – 2024-02-29 (×20): 10 mg
  Filled 2024-02-22 (×21): qty 2

## 2024-02-22 MED ORDER — METHOCARBAMOL 500 MG PO TABS
500.0000 mg | ORAL_TABLET | Freq: Three times a day (TID) | ORAL | Status: DC
  Administered 2024-02-22 – 2024-02-29 (×22): 500 mg
  Filled 2024-02-22 (×23): qty 1

## 2024-02-22 MED ORDER — PIPERACILLIN-TAZOBACTAM 3.375 G IVPB 30 MIN
3.3750 g | Freq: Four times a day (QID) | INTRAVENOUS | Status: AC
  Administered 2024-02-22 – 2024-02-27 (×21): 3.375 g via INTRAVENOUS
  Filled 2024-02-22 (×21): qty 50

## 2024-02-22 MED ORDER — SEVELAMER CARBONATE 800 MG PO TABS: 800.0000 mg | ORAL_TABLET | Freq: Three times a day (TID) | ORAL | Status: DC

## 2024-02-22 MED ORDER — CHLORHEXIDINE GLUCONATE CLOTH 2 % EX PADS
6.0000 | MEDICATED_PAD | Freq: Every day | CUTANEOUS | Status: DC
  Administered 2024-02-22 – 2024-03-02 (×10): 6 via TOPICAL

## 2024-02-22 MED ORDER — SODIUM CHLORIDE 0.9% FLUSH: 10.0000 mL | INTRAVENOUS | Status: DC | PRN

## 2024-02-22 MED ORDER — GABAPENTIN 250 MG/5ML PO SOLN
100.0000 mg | Freq: Three times a day (TID) | ORAL | Status: DC
  Administered 2024-02-22 – 2024-02-29 (×22): 100 mg
  Filled 2024-02-22 (×29): qty 2

## 2024-02-22 MED ORDER — PRISMASOL BGK 4/2.5 32-4-2.5 MEQ/L EC SOLN
Status: DC
Start: 2024-02-22 — End: 2024-03-02

## 2024-02-22 MED ORDER — OSMOLITE 1.5 CAL PO LIQD
1000.0000 mL | ORAL | Status: DC
  Administered 2024-02-22: 1000 mL
  Filled 2024-02-22: qty 1000

## 2024-02-22 MED ORDER — ETOMIDATE 2 MG/ML IV SOLN
INTRAVENOUS | Status: AC
  Administered 2024-02-22: 10 mg via INTRAVENOUS
  Filled 2024-02-22: qty 10

## 2024-02-22 MED ORDER — OXYCODONE HCL 5 MG PO TABS: 10.0000 mg | ORAL_TABLET | Freq: Four times a day (QID) | ORAL | Status: DC | PRN

## 2024-02-22 MED ORDER — THIAMINE MONONITRATE 100 MG PO TABS
100.0000 mg | ORAL_TABLET | Freq: Every day | ORAL | Status: AC
  Administered 2024-02-22 – 2024-02-26 (×5): 100 mg
  Filled 2024-02-22 (×5): qty 1

## 2024-02-22 MED ORDER — ETOMIDATE 2 MG/ML IV SOLN: 10.0000 mg | Freq: Once | INTRAVENOUS | Status: AC

## 2024-02-22 MED ORDER — CHLORHEXIDINE GLUCONATE CLOTH 2 % EX PADS: 6.0000 | MEDICATED_PAD | Freq: Every day | CUTANEOUS | Status: DC

## 2024-02-22 MED ORDER — PANTOPRAZOLE SODIUM 40 MG IV SOLR
40.0000 mg | Freq: Two times a day (BID) | INTRAVENOUS | Status: DC
  Administered 2024-02-22 – 2024-03-02 (×19): 40 mg via INTRAVENOUS
  Filled 2024-02-22 (×19): qty 10

## 2024-02-22 MED ORDER — ROCURONIUM BROMIDE 10 MG/ML (PF) SYRINGE
PREFILLED_SYRINGE | INTRAVENOUS | Status: AC
  Filled 2024-02-22: qty 10

## 2024-02-22 MED ORDER — PROSOURCE TF20 ENFIT COMPATIBL EN LIQD
60.0000 mL | Freq: Every day | ENTERAL | Status: DC
  Administered 2024-02-22 – 2024-02-29 (×8): 60 mL
  Filled 2024-02-22 (×8): qty 60

## 2024-02-22 MED ORDER — DOLUTEGRAVIR SODIUM 50 MG PO TABS
50.0000 mg | ORAL_TABLET | Freq: Every day | ORAL | Status: DC
Start: 2024-02-22 — End: 2024-03-02
  Administered 2024-02-22 – 2024-02-29 (×8): 50 mg
  Filled 2024-02-22 (×10): qty 1

## 2024-02-22 MED ORDER — RENA-VITE PO TABS
1.0000 | ORAL_TABLET | Freq: Every day | ORAL | Status: DC
  Administered 2024-02-22 – 2024-02-28 (×7): 1
  Filled 2024-02-22 (×6): qty 1

## 2024-02-22 MED ORDER — SODIUM CHLORIDE 0.9 % FOR CRRT
INTRAVENOUS_CENTRAL | Status: DC | PRN
Start: 2024-02-22 — End: 2024-03-02

## 2024-02-22 MED ORDER — SODIUM CHLORIDE 0.9% IV SOLUTION: Freq: Once | INTRAVENOUS | Status: AC

## 2024-02-22 MED ORDER — EMTRICITABINE-TENOFOVIR AF 200-25 MG PO TABS
1.0000 | ORAL_TABLET | Freq: Every day | ORAL | Status: DC
  Administered 2024-02-22 – 2024-02-29 (×8): 1
  Filled 2024-02-22 (×10): qty 1

## 2024-02-22 MED ORDER — MIDAZOLAM HCL 2 MG/2ML IJ SOLN: 2.0000 mg | INTRAMUSCULAR | Status: DC | PRN

## 2024-02-22 MED ORDER — ROCURONIUM BROMIDE 10 MG/ML (PF) SYRINGE: 50.0000 mg | PREFILLED_SYRINGE | Freq: Once | INTRAVENOUS | Status: DC

## 2024-02-22 NOTE — Progress Notes (Signed)
 Nutrition Follow-up  DOCUMENTATION CODES:  Severe malnutrition in context of chronic illness  INTERVENTION:  Initiate tube feeding via OGT. Recommend replacing with cortrak 10/13 as pt has had several weeks of poor PO intake and refusal to take in adequate intake: Osmolite 1.5 at 45 ml/h (1080 ml per day) Start at 25 and advance by 10mL every 12 hours to reach goal Prosource TF20 60 ml 1x/d Provides 1700 kcal, 87 gm protein, 823 ml free water daily Pt is at risk for refeeding syndrome given severe malnutrition. Monitor magnesium  and phosphorus daily x 3 days, MD to replete as needed. 100mg  thiamine x 5 days Renal MVI daily  NUTRITION DIAGNOSIS:  Severe Malnutrition related to chronic illness as evidenced by severe muscle depletion, severe fat depletion. - remains applicable  GOAL:  Patient will meet greater than or equal to 90% of their needs - progressing  MONITOR:  PO intake, Supplement acceptance  REASON FOR ASSESSMENT:  Consult Enteral/tube feeding initiation and management, Assessment of nutrition requirement/status (CRRT)  ASSESSMENT:  Pt with PMH significant for: ESRD-HD, HIV, PAD s/p bilateral BKA, chronic HCV. Of note, recently admiteed 5/28 - 6/06 for MRSA bacteremia with left fourth finger osteomyelitis (amputation 5/29). Presenting to hospital w/ R hip pain and found to have L4-L5 osteomyelitis with abscess.  8/4 - admitted to [facility] 10/11 - rapid response called for hypoglycemia and hypotension, transferred to ICU, intubated 10/12 - pt self extubated and was reintubated within 20 minutes, CRRT being initiated   Patient is currently intubated on ventilator support, OGT in place and consult received to initiate enteral feeds.   Pt has been admitted for >2 months. Has been followed by RD team since 9/5. Per chart review, it appears that pt's intake has been poor for several weeks which has resulted in recurrent hypoglycemia. Pt will be at high risk for refeeding  with the initiation of enteral nutrition due to the prolonged poor PO intake and already diagnosed severe malnutrition. Will start and advance cautiously.    MV: 8.3 L/min Temp (24hrs), Avg:98.1 F (36.7 C), Min:96.8 F (36 C), Max:99.2 F (37.3 C) MAP (Art Line): 61-85 mmHg this shift  Admit weight: 40.3 kg   Current weight: 46.3 kg  EDW 42kg (as of 11/10/2023 HD report)  Intake/Output Summary (Last 24 hours) at 02/22/2024 1523 Last data filed at 02/22/2024 1000 Gross per 24 hour  Intake 1033.73 ml  Output --  Net 1033.73 ml  Net IO Since Admission: 6,927.66 mL [02/22/24 1523]  Drains/Lines: Tunneled HD catheter, right IJ double lumen CVC triple lumen right femoral Art Line, right femoral OGT (gastric per imaging)  Average Meal Intake: 10/6-10/11: 15% intake x 8 recorded meals Pt refused 6 of his 8 recorded meals over the last week  Nutritionally Relevant Medications: Scheduled Meds:  PROSource Plus  30 mL Oral Daily   docusate  100 mg Per Tube BID   dolutegravir   50 mg Per Tube Daily   emtricitabine -tenofovir  AF  1 tablet Per Tube Daily   NEPRO CARB STEADY  237 mL Oral BID AC & HS   multivitamin  1 tablet Oral QHS   pantoprazole   40 mg Intravenous Q12H   polyethylene glycol  17 g Per Tube Daily   Continuous Infusions:  dextrose  50 mL/hr at 02/22/24 1000   norepinephrine  (LEVOPHED ) Adult infusion 5 mcg/min (02/22/24 1308)   piperacillin -tazobactam     vancomycin      PRN Meds: ondansetron   Labs Reviewed: CBG ranges from 44-114 mg/dL over  the last 24 hours HgbA1c 5.9% (9/17)  NUTRITION - FOCUSED PHYSICAL EXAM: Flowsheet Row Most Recent Value  Orbital Region Severe depletion  Upper Arm Region Severe depletion  Thoracic and Lumbar Region Severe depletion  Buccal Region Severe depletion  Temple Region Severe depletion  Clavicle Bone Region Severe depletion  Clavicle and Acromion Bone Region Severe depletion  Scapular Bone Region Severe depletion  Dorsal Hand  Moderate depletion  Patellar Region Unable to assess  Anterior Thigh Region Severe depletion  Posterior Calf Region Unable to assess  Edema (RD Assessment) None  Hair Reviewed  Eyes Reviewed  Mouth Reviewed  [poor dentition]  Skin Reviewed  Nails Reviewed    Diet Order:   Diet Order             Diet regular Room service appropriate? Yes with Assist; Fluid consistency: Thin; Fluid restriction: 1200 mL Fluid  Diet effective now                   EDUCATION NEEDS:  Education needs have been addressed  Skin:  Skin Assessment: Skin Integrity Issues: Skin Integrity Issues:: Stage II Stage II: sacrum (0.5 x 0.3 x 0.12 cm) on 9/17  Last BM:  10/10 - type 7  Height:  Ht Readings from Last 1 Encounters:  02/22/24 5' 2 (1.575 m)    Weight:  Wt Readings from Last 1 Encounters:  02/21/24 46.3 kg    Ideal Body Weight:  47.3 kg (adjusted for bilateral BKA (11.8%))  BMI:  Body mass index is 18.67 kg/m.  Estimated Nutritional Needs:  Kcal:  1600-1800 kcal/d Protein:  80-100g/d Fluid:  1.5L/d    Vernell Lukes, RD, LDN, CNSC Registered Dietitian II Please reach out via secure chat

## 2024-02-22 NOTE — Progress Notes (Signed)
 Hypoglycemic Event  CBG: 67  Treatment: D50 25 mL (12.5 gm)  Symptoms: None  Follow-up CBG: Time:0811 CBG Result:109  Possible Reasons for Event: Unknown  Comments/MD notified:Mohammed, MD    Trameka Dorough [address]

## 2024-02-22 NOTE — Progress Notes (Signed)
 [facility]    Date of Admission:  12/14/2023      ID: Alvin Daniels is a 53 y.o. 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)   Severe protein-calorie malnutrition   Normocytic anemia   ESRD on hemodialysis ([name])   S/P bilateral BKA (below knee amputation) ([name])   Chronic hepatitis C without hepatic coma ([name])   HIV (human immunodeficiency virus infection) ([name])   Hx MRSA infection   Acute osteomyelitis of lumbar spine ([name])   Epidural abscess   Paraspinal abscess ([name])   Pyogenic inflammation of bone ([name])    Subjective: Decompensated yesterday requiring intubation, pressors, and sedation since self extubated right fem line placed for access  Medications:   (feeding supplement) PROSource Plus  30 mL Oral Daily   acetaminophen   1,000 mg Per Tube TID   acyclovir ointment  1 Application Topical 6 X Daily   artificial tears   Right Eye QID   Chlorhexidine  Gluconate Cloth  6 each Topical Q0600   darbepoetin (ARANESP ) injection - DIALYSIS  150 mcg Subcutaneous Q Fri-1800   docusate  100 mg Per Tube BID   dolutegravir   50 mg Per Tube Daily   emtricitabine -tenofovir  AF  1 tablet Per Tube Daily   feeding supplement (NEPRO CARB STEADY)  237 mL Oral BID AC & HS   fentaNYL  (SUBLIMAZE ) injection  25-50 mcg Intravenous Once   gabapentin   100 mg Per Tube Q8H   gatifloxacin   1 drop Right Eye BID   lidocaine   2 patch Transdermal Q24H   methocarbamol   500 mg Per Tube TID   midodrine   10 mg Per Tube TID WC   [START ON 02/24/2024] midodrine   10 mg Per Tube Q T,Th,Sa-HD   multivitamin  1 tablet Oral QHS   mouth rinse  15 mL Mouth Rinse Q2H   pantoprazole  (PROTONIX ) IV  40 mg Intravenous Q12H   polyethylene glycol  17 g Per Tube Daily   rocuronium   50 mg Intravenous Once   sevelamer  carbonate  800 mg Per Tube TID WC    Objective: Vital signs in last  24 hours: Temp:  [96.8 F (36 C)-99.2 F (37.3 C)] 99.2 F (37.3 C) (10/12 1123) Pulse Rate:  [62-112] 88 (10/12 1045) Resp:  [9-31] 17 (10/12 1045) BP: (79-188)/(31-114) 188/114 (10/11 1712) SpO2:  [82 %-100 %] 96 % (10/12 1045) Arterial Line BP: (96-179)/(49-83) 115/59 (10/12 1045) FiO2 (%):  [70 %-100 %] 70 % (10/12 0810)  Physical Exam  Constitutional: He is intubate and sedated  He appears well-developed and well-nourished. No distress.  HENT:  Mouth/Throat: dried blood on lips No oropharyngeal exudate.  Cardiovascular: Normal rate, regular rhythm and normal heart sounds. Exam reveals no gallop and no friction rub.  No murmur heard.  Pulmonary/Chest: Effort normal and breath sounds normal. No respiratory distress. He has no wheezes.  Abdominal: Soft. Bowel sounds are normal. He exhibits no distension. There is no tenderness. Fem dressing on right side-blood oozing on dressing Neurological: sedated Skin: Skin is warm and dry. No rash noted. No erythema.    Lab Results Recent Labs    02/21/24 0422 02/21/24 1942  WBC 9.3  --   HGB 8.0* 8.5*  HCT 24.3* 25.0*  NA 127* 130*  K 3.6 3.4*  CL 91*  --   CO2 24  --   BUN 35*  --  CREATININE 2.51*  --    Liver Panel Recent Labs    02/21/24 0422  ALBUMIN  2.0*   Sedimentation Rate No results for input(s): ESRSEDRATE in the last 72 hours. C-Reactive Protein No results for input(s): CRP in the last 72 hours.  Microbiology: reviewed Studies/Results: DG CHEST PORT 1 VIEW Result Date: 02/22/2024 CLINICAL DATA:  Hypoxia EXAM: PORTABLE CHEST 1 VIEW COMPARISON:  Film from the previous day. FINDINGS: Endotracheal tube, gastric catheter and jugular dialysis catheter are again seen and stable. Cardiac shadow is obscured by parenchymal opacities worsened in the interval from the prior exam consistent with edema. IMPRESSION: Worsening CHF Electronically Signed   By: Oneil Devonshire M.D.   On: 02/22/2024 02:34   DG CHEST PORT 1  VIEW Result Date: 02/21/2024 CLINICAL DATA:  Status post intubation EXAM: PORTABLE CHEST 1 VIEW COMPARISON:  Film from earlier in the same day. FINDINGS: Cardiac shadow is stable. Endotracheal tube and gastric catheter are noted in satisfactory position. Stable right jugular central line is seen. Lungs are well aerated bilaterally. Increased central vascular congestion is seen with parenchymal density consistent with edema. The overall appearance has improved slightly in the interval from the prior exam. IMPRESSION: Persistent CHF and edema. Tubes and lines in satisfactory position. Electronically Signed   By: Oneil Devonshire M.D.   On: 02/21/2024 21:54   DG CHEST PORT 1 VIEW Result Date: 02/21/2024 EXAM: 1 VIEW(S) XRAY OF THE CHEST 02/21/2024 08:22:29 AM COMPARISON: 11/10/2023 CLINICAL HISTORY: SOB (shortness of breath). FINDINGS: LINES, TUBES AND DEVICES: There is a right-sided dialysis catheter with the tip in the right atrium. LUNGS AND PLEURA: Diffuse bilateral upper and lower lung zone opacities are identified. No pulmonary edema. No pleural effusion. No pneumothorax. HEART AND MEDIASTINUM: No acute abnormality of the cardiac and mediastinal silhouettes. BONES AND SOFT TISSUES: Osseous structures are unremarkable. IMPRESSION: 1. Diffuse bilateral upper and lower lung zone opacities. Imaging findings are favored to represent diffuse interstitial and alveolar edema. Multifocal infection not excluded. 2. Right-sided dialysis catheter with tip in the right atrium. Electronically signed by: Waddell Calk MD 02/21/2024 08:35 AM EDT RP Workstation: [id number]     Assessment/Plan: Respiratory failure, vent dependent = continue on vent support. Currently being treated for aspiration/hospital associated pneumonia with vanco and piptazo, both renally dosed. I have asked RT to do respiratory culture today to see if need to expand abtx.  If further decompensation, recommend to escalate to meropenem  MRSA  discitis/om/psoas abscess= previously vanco sensitive. Prior to decompensation wanted to see if IR could repeat aspirate, repeat cultures to see if any change in sensitivities. Overall his condition is to frail for further surgical intervention.  CKD on HD= transition to CRRT   Hiv disease= continue on biktarvy   Overall condition is guarded  Continue on contact isolation  evaluation of this patient requires complex antimicrobial therapy evaluation and counseling and isolation needs for disease transmission risk assessment and mitigation.   I have personally spent 50 minutes involved in face-to-face and non-face-to-face activities for this patient on the day of the visit. Professional time spent includes the following activities: Preparing to see the patient (review of tests), , Performing a medically appropriate examination and/or evaluation , Ordering medications/tests/procedures, referring and communicating with other health care professionals, Documenting clinical information in the EMR, Independently interpreting results (not separately reported)   [facility] Pager: [phone]  02/22/2024, 12:48 PM

## 2024-02-22 NOTE — Progress Notes (Signed)
 Hypoglycemic Event  CBG: 68   Treatment: D50 25 mL (12.5 gm)  Symptoms: None  Follow-up CBG: Time: 1548 CBG Result:139  Possible Reasons for Event: Unknown  Comments/MD notified:Mohammed, MD    Alvin Daniels [address]

## 2024-02-22 NOTE — Progress Notes (Signed)
 NAMELani Daniels, MRN:  [id number], DOB:  07-Jul-1970, LOS: 69 ADMISSION DATE:  12/14/2023,   CHIEF COMPLAINT: Back pain, psoas abscess septic shock  History of Present Illness:   Alvin Daniels is a 53 y.o. male with medical history significant of ESRD on HD MWF, type 2 diabetes mellitus poorly controlled, HIV, chronic hepatitis C, PAD s/p bilateral BKA, and recent admission for MRSA septicemia, possible IE, and L middle finger OM s/p amputation per Ortho surgery on 5/29 (cultures grew E. 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   Pt was in USOH until this past week when he had difficulty walking due to the pain in his R hip. The pain would bother him all day, but he was able to walk most of the day and rest in the evening. Yesterday, the pain was so bad that he was unable to walk at all, so he presented to the ED for further evaluation. Of note, pt was recently admitted for MRSA septicemia and possible IE and was continued on IV vancomycin  w/ HD until 7/19, and d/c recommended repeat TEE to eval for clearance of IE.   In the ED, pt AFVSS. Labs showed K 3.4, Cr 9.06, CRP 15.2, and ESR 133. MRI sacrum showed discitis and osteomyelitis at L4-5 with associated paraspinal inflammatory changes and right psoas Abscess. EDP ordered IV vancomycin /cefepime , and requested medicine admission.   Patient was brought into ICU because of increasing vasopressor needs.  He was eventually intubated.  Pertinent  Medical History   Past Medical History:  Diagnosis Date   Acute bilateral low back pain 01/03/2021   AIDS ([name]) 11/22/2014   Back pain 06/09/2019   BKA stump complication ([name]) 06/09/2019   Chronic diarrhea    Chronic hepatitis C without hepatic coma ([name]) 11/22/2014   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[name])    Right   Corneal ulcer 12/18/2022   Descemetocele of left eye 12/18/2022   Diabetic  neuropathy ([name])    ESRD (end stage renal disease) on dialysis ([name])    TTS; don't remember street name (05/03/2014)   Hepatitis C    HIV INFECTION 06/27/2010   Qualifier: Diagnosis of  By: Renae CMA ( AAMA), Jacqueline     Hypotension 06/02/2012   Metabolic bone disease 06/18/2012   MRSA infection    Normocytic anemia 06/17/2012   Pancreatitis    Pressure ulcer of BKA stump, stage 2 ([name]) 11/22/2014   Severe protein-calorie malnutrition 06/17/2012   Uncontrolled diabetes mellitus with complications 06/27/2010   Annotation: uncontrolled Qualifier: Diagnosis of  By: Renae CMA ( AAMA), Jacqueline     Vaccine counseling 05/29/2022     Significant Hospital Events: Including procedures, antibiotic start and stop dates in addition to other pertinent events   02/21/2024 intubated for respiratory failure and vasopressor therapy was increasing during the day because of septic shock.  Interim History / Subjective:  Overnight events noted.  Patient self extubated and got reintubated.  His vasopressor needs are coming down.  He is on 5 mcg/min of Levophed  right now.  He is well sedated this morning with Precedex and fentanyl .  He has been having hypoglycemic episodes on dextrose  infusion.  Pending nephrology input he may need CRRT or HD today.  Objective    Blood pressure (!) 188/114, pulse 88, temperature 99.2 F (37.3 C), resp. rate 17, height 4' 11.02 (1.499 m), weight 46.3 kg, SpO2 96%.  Vent Mode: PRVC FiO2 (%):  [70 %-100 %] 70 % Set Rate:  [20 bmp] 20 bmp Vt Set:  [380 mL] 380 mL PEEP:  [5 cmH20-10 cmH20] 8 cmH20 Plateau Pressure:  [18 cmH20-28 cmH20] 21 cmH20   Intake/Output Summary (Last 24 hours) at 02/22/2024 1126 Last data filed at 02/22/2024 1000 Gross per 24 hour  Intake 1639.52 ml  Output --  Net 1639.52 ml   Filed Weights   02/17/24 1154 02/19/24 0834 02/21/24 0859  Weight: 43.9 kg 46.6 kg 46.3 kg    Examination:   Physical exam: General:  Crtitically ill-appearing male, orally intubated HEENT: [place]/AT, eyes anicteric.  ETT and cortrak in place Neuro: Sedated, not following commands.  Eyes are closed.   Chest: Coarse breath sounds, no wheezes or rhonchi Heart: Regular rate and rhythm, no murmurs or gallops Abdomen: Soft, nondistended, bowel sounds present Extremities: Bilateral below-knee amputations noted.  Resolved problem list   Assessment and Plan  Neurological Altered mental status due to sepsis  Analgesia: Fentanyl  Sedation: Precedex RAS Goal 0 to -1  Septic shock Acute on chronic congestive heart failure Acute hypoxic respiratory failure due to pulm edema  - Echocardiogram pending - Vasopressors/Ionotropes: Daily on Levophed  at 5 mcg/min - MAP Goal > 65 -- Ventilator order set in place - Daily SAT and SBT - Ventilator settings -PRVC on 70% FiO2 8 of PEEP - ABG from 02/21/2024 noted - Wean FiO2 and PEEP as will attempt fluid removal      Valvular vegetation on TEE History of HIV Psoas abscess -bilateral L4-L5 osteomyelitis  -MRI lumbar spine on 02/18/2024 noted - Blood cultures -no growth to date - Appreciate ID input -Too sick for IR intervention right now -Appreciate neurosurgery input -no plans for surgery right now - Nutrition: Diet, Tube feeds - Bowel Regimen    ESRD HD on Monday Wednesday Friday  -Discussed with renal given the high FiO2 and pulmonary edema will start CRRT today -monitor electrolytes    Type 2 diabetes mellitus Hypoglycemia  -HbA1C -a month ago was 5.9 -Maintain blood sugars between 140 to 180 -Patient is on dextrose  drip because of hypoglycemia    VTE Prophylaxis -Heparin /Lovenox  Code Status-full code Disposition-to remain in ICU  Critical Care time - 45 minutes.  Patient's emergency contact Sari and Gigi in the room seeking an update.  They identified themselves as friends.  I updated them.  They tried to call the patient's brother on phone but it went to  voicemail.  At this stage they are not in a position to make any decisions for the patients.  They said the brother will visit and we can further discuss goals of care with him.  At this stage patient remains full cares.  They understand that the patient is critically ill and source control of this infection is challenging.   Labs   CBC: Recent Labs  Lab 02/16/24 0712 02/17/24 0840 02/19/24 0852 02/21/24 0422 02/21/24 1942  WBC 6.2 8.1 6.8 9.3  --   NEUTROABS  --   --  5.6  --   --   HGB 10.8* 10.1* 9.9* 8.0* 8.5*  HCT 33.9* 31.4* 30.0* 24.3* 25.0*  MCV 99.1 99.7 98.4 98.0  --   PLT 95* 90* 82* 51*  --     Basic Metabolic Panel: Recent Labs  Lab 02/16/24 0712 02/17/24 0840 02/19/24 0852 02/21/24 0422 02/21/24 1942  NA 128* 129* 126* 127* 130*  K 4.8 5.4* 4.5 3.6 3.4*  CL 94* 93* 91*  91*  --   CO2 24 22 24 24   --   GLUCOSE 97 84 66* 115*  --   BUN 34* 48* 46* 35*  --   CREATININE 3.09* 3.60* 3.11* 2.51*  --   CALCIUM  9.0 9.0 8.5* 8.0*  --   PHOS 3.6 4.9*  --  3.2  --    GFR: Estimated Creatinine Clearance: 22.3 mL/min (A) (by C-G formula based on SCr of 2.51 mg/dL (H)). Recent Labs  Lab 02/16/24 0712 02/17/24 0840 02/19/24 0852 02/21/24 0422 02/21/24 0813  WBC 6.2 8.1 6.8 9.3  --   LATICACIDVEN  --   --   --   --  1.3    Liver Function Tests: Recent Labs  Lab 02/16/24 0712 02/17/24 0840 02/21/24 0422  ALBUMIN  2.2* 2.4* 2.0*   No results for input(s): LIPASE, AMYLASE in the last 168 hours. No results for input(s): AMMONIA in the last 168 hours.  ABG    Component Value Date/Time   PHART 7.346 (L) 02/21/2024 1942   PCO2ART 40.2 02/21/2024 1942   PO2ART 103 02/21/2024 1942   HCO3 22.0 02/21/2024 1942   TCO2 23 02/21/2024 1942   ACIDBASEDEF 3.0 (H) 02/21/2024 1942   O2SAT 98 02/21/2024 1942     Coagulation Profile: No results for input(s): INR, PROTIME in the last 168 hours.  Cardiac Enzymes: Recent Labs  Lab 02/18/24 1835  CKTOTAL  35*    HbA1C: Hemoglobin A1C  Date/Time Value Ref Range Status  07/17/2023 04:28 PM 10.0 (A) 4.0 - 5.6 % Final  01/29/2023 03:22 PM 8.5 (A) 4.0 - 5.6 % Final   HbA1c, POC (controlled diabetic range)  Date/Time Value Ref Range Status  09/28/2018 02:21 PM 6.8 0.0 - 7.0 % Final   Hgb A1c MFr Bld  Date/Time Value Ref Range Status  01/28/2024 04:00 AM 5.9 (H) 4.8 - 5.6 % Final    Comment:    (NOTE) Diagnosis of Diabetes The following HbA1c ranges recommended by the American Diabetes Association (ADA) may be used as an aid in the diagnosis of diabetes mellitus.  Hemoglobin             Suggested A1C NGSP%              Diagnosis  <5.7                   Non Diabetic  5.7-6.4                Pre-Diabetic  >6.4                   Diabetic  <7.0                   Glycemic control for                       adults with diabetes.    08/01/2022 11:53 PM 10.7 (H) 4.8 - 5.6 % Final    Comment:    (NOTE)         Prediabetes: 5.7 - 6.4         Diabetes: >6.4         Glycemic control for adults with diabetes: <7.0     CBG: Recent Labs  Lab 02/22/24 0436 02/22/24 0724 02/22/24 0734 02/22/24 0811 02/22/24 1122  GLUCAP 114* 44* 67* 109* 75    Review of Systems:   Review of system could not be done as the patient is  sedated and intubated.  Past Medical History:  He,  has a past medical history of Acute bilateral low back pain (01/03/2021), AIDS ([name]) (11/22/2014), Back pain (06/09/2019), BKA stump complication ([name]) (06/09/2019), Chronic diarrhea, Chronic hepatitis C without hepatic coma ([name]) (11/22/2014), 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[name]), Corneal ulcer (12/18/2022), Descemetocele of left eye (12/18/2022), Diabetic neuropathy ([name]), ESRD (end stage renal disease) on dialysis ([name]), Hepatitis C, HIV INFECTION (06/27/2010), Hypotension (06/02/2012), Metabolic bone disease (06/18/2012), MRSA infection, Normocytic anemia (06/17/2012), Pancreatitis, Pressure ulcer of BKA stump, stage  2 ([name]) (11/22/2014), Severe protein-calorie malnutrition (06/17/2012), Uncontrolled diabetes mellitus with complications (06/27/2010), and Vaccine counseling (05/29/2022).   Surgical History:   Past Surgical History:  Procedure Laterality Date   AMPUTATION Left 04/20/2014   Procedure: 3rd toe amputation, 4th Toe Amputation,  5th Toe Amputation;  Surgeon: Jerona Harden GAILS, MD;  Location: [facility] OR;  Service: Orthopedics;  Laterality: Left;   AMPUTATION Left 05/02/2014   Procedure: Midfoot Amputation;  Surgeon: Jerona Harden GAILS, MD;  Location: [facility] OR;  Service: Orthopedics;  Laterality: Left;   AMPUTATION Left 06/17/2014   Procedure: AMPUTATION BELOW KNEE;  Surgeon: Jerona Harden GAILS, MD;  Location: [facility] OR;  Service: Orthopedics;  Laterality: Left;   AMPUTATION Right 09/04/2018   Procedure: RIGHT BELOW KNEE AMPUTATION;  Surgeon: Harden Jerona GAILS, MD;  Location: [facility] OR;  Service: Orthopedics;  Laterality: Right;  RIGHT BELOW KNEE AMPUTATION   AMPUTATION FINGER Left 10/09/2023   Procedure: AMPUTATION, FINGER;  Surgeon: Romona Harari, MD;  Location: [facility] OR;  Service: Orthopedics;  Laterality: Left;  Left Ring Finger amputation   AV FISTULA PLACEMENT Left    AV FISTULA PLACEMENT Left 05/10/2016   Procedure: Creation Left Arm Brachiocephalic Arteriovenous Fistula and Ligation of Radiocephalic Fistula;  Surgeon: Lonni GORMAN Blade, MD;  Location: [facility] OR;  Service: Vascular;  Laterality: Left;   DIALYSIS/PERMA CATHETER INSERTION N/A 09/01/2023   Procedure: DIALYSIS/PERMA CATHETER INSERTION;  Surgeon: Melia Lynwood ORN, MD;  Location: [facility] INVASIVE CV LAB;  Service: Cardiovascular;  Laterality: N/A;   FEMUR IM NAIL Left 08/17/2016   Procedure: INTRAMEDULLARY (IM) RETROGRADE FEMORAL NAILING;  Surgeon: Oneil JAYSON Herald, MD;  Location: [facility] OR;  Service: Orthopedics;  Laterality: Left;   FOOT AMPUTATION THROUGH ANKLE Left 12/'21/2015   midfoot   IR AV DIALY SHUNT INTRO NEEDLE/INTRACATH INITIAL W/PTA/IMG LEFT  04/17/2023   IR FLUORO GUIDE  CV LINE RIGHT  10/14/2023   IR FLUORO GUIDE CV LINE RIGHT  11/11/2023   IR GENERIC HISTORICAL Left 05/01/2016   IR THROMBECTOMY AV FISTULA W/THROMBOLYSIS/PTA INC/SHUNT/IMG LEFT 05/01/2016 Toribio Faes, MD [facility]   IR GENERIC HISTORICAL  05/01/2016   IR US  GUIDE VASC ACCESS LEFT 05/01/2016 Toribio Faes, MD [facility]   IR GENERIC HISTORICAL  05/07/2016   IR FLUORO GUIDE CV LINE RIGHT 05/07/2016 Ami Bellman, DO [facility]   IR GENERIC HISTORICAL  05/07/2016   IR US  GUIDE VASC ACCESS RIGHT 05/07/2016 Ami Bellman, DO [facility]   IR GENERIC HISTORICAL  05/22/2016   IR US  GUIDE VASC ACCESS RIGHT 05/22/2016 Ozell Specking, MD [facility]   IR GENERIC HISTORICAL  05/22/2016   IR FLUORO GUIDE CV LINE RIGHT 05/22/2016 Ozell Specking, MD [facility]   IR LUMBAR DISC ASPIRATION W/IMG GUIDE  01/07/2024   IR REMOVAL TUN CV CATH W/O FL  08/21/2016   IR REMOVAL TUN CV CATH W/O FL  10/12/2023   IR US  GUIDE VASC ACCESS LEFT  04/17/2023   IR US  GUIDE VASC ACCESS RIGHT  10/14/2023   PERIPHERAL VASCULAR CATHETERIZATION Left 05/09/2016   Procedure: A/V Fistulagram;  Surgeon: Lonni GORMAN Blade, MD;  Location: [facility] INVASIVE CV LAB;  Service: Cardiovascular;  Laterality: Left;  arm   TEE WITHOUT CARDIOVERSION N/A 06/22/2018   Procedure: TRANSESOPHAGEAL ECHOCARDIOGRAM (TEE);  Surgeon: Jeffrie Oneil BROCKS, MD;  Location: [facility] ENDOSCOPY;  Service: Cardiovascular;  Laterality: N/A;   TRANSESOPHAGEAL ECHOCARDIOGRAM (CATH LAB) N/A 10/15/2023   Procedure: TRANSESOPHAGEAL ECHOCARDIOGRAM;  Surgeon: Rolan Ezra GORMAN, MD;  Location: [facility] INVASIVE CV LAB;  Service: Cardiovascular;  Laterality: N/A;     Social History:   reports that he has never smoked. He has never used smokeless tobacco. He reports that he does not drink alcohol  and does not use drugs.   Family History:  His family history includes Diabetes in his father and mother.   Allergies No Known Allergies   Home Medications  Prior to Admission medications    Medication Sig Start Date End Date Taking? Authorizing Provider  bictegravir-emtricitabine -tenofovir  AF (BIKTARVY ) 50-200-25 MG TABS tablet Take 1 tablet by mouth daily. 04/16/23  Yes Fleeta Rothman, Jomarie SAILOR, MD  insulin  aspart protamine  - aspart (NOVOLOG  70/30 FLEXPEN) (70-30) 100 UNIT/ML FlexPen Inject 20 Units into the skin 2 (two) times daily. Patient taking differently: Inject 10 Units into the skin 2 (two) times daily. 07/17/23  Yes Tanda Bleacher, MD  ZYPITAMAG  4 MG TABS Take by mouth. 11/26/23  Yes [provider]  gabapentin  (NEURONTIN ) 100 MG capsule Take 1 capsule (100 mg total) by mouth at bedtime. Patient not taking: Reported on 12/16/2023 10/17/23 01/15/24  [place], Camellia PARAS, DO  HYDROcodone -acetaminophen  (NORCO/VICODIN) 5-325 MG tablet Take 1 tablet by mouth every 6 (six) hours as needed for severe pain (pain score 7-10) or moderate pain (pain score 4-6). Patient not taking: Reported on 12/15/2023 10/17/23   [place], Camellia PARAS, DO  midodrine  (PROAMATINE ) 10 MG tablet Take 1 tablet (10 mg total) by mouth 3 (three) times daily as needed for low blood pressure, SBP</=100. may take 30 min prior to dialysis and mid treatment as needed. Patient not taking: Reported on 12/16/2023 09/05/23     bictegravir-emtricitabine -tenofovir  AF (BIKTARVY ) 50-200-25 MG TABS tablet Take 1 tablet by mouth daily. 01/02/22   Fleeta Rothman Jomarie SAILOR, MD     Critical care time: 105     Tamela Stakes, MD  Attending Physician, Critical Care Medicine [place] [facility] See Amion for pager If no response to pager, please call [phone] until 7pm After 7pm, Please call E-link [phone]

## 2024-02-22 NOTE — Progress Notes (Signed)
 [facility] Progress Note   Subjective:    Seen in room. Another acute decompensation yesterday requiring emergent intubation.  Fio2 90%, CXR looks wet.  Self-extubated overnight as well and was re-intubated.  On norepi, midodrine .  With intradialytic hypotension, not sure how much volume we could remove to assist with extubation.  D/w PCCM- will start CRRT.  Objective Vitals:   02/22/24 1015 02/22/24 1030 02/22/24 1045 02/22/24 1123  BP:      Pulse: 87 90 88   Resp: 15 14 17    Temp:    99.2 F (37.3 C)  TempSrc:      SpO2: (!) 89% 100% 96%   Weight:      Height:       Physical Exam General: Chronically ill, frail, nad  Heart: RRR Lungs: CTAB Abdomen: soft Extremities: no LE edema, B BKA, some necrotic appearing ulcerations on L hand Dialysis Access: TDC, some reddened skin on the dressing  Filed Weights   02/17/24 1154 02/19/24 0834 02/21/24 0859  Weight: 43.9 kg 46.6 kg 46.3 kg    Intake/Output Summary (Last 24 hours) at 02/22/2024 1326 Last data filed at 02/22/2024 1000 Gross per 24 hour  Intake 1130.07 ml  Output --  Net 1130.07 ml     Additional Objective Labs: Basic Metabolic Panel: Recent Labs  Lab 02/16/24 0712 02/17/24 0840 02/19/24 0852 02/21/24 0422 02/21/24 1942  NA 128* 129* 126* 127* 130*  K 4.8 5.4* 4.5 3.6 3.4*  CL 94* 93* 91* 91*  --   CO2 24 22 24 24   --   GLUCOSE 97 84 66* 115*  --   BUN 34* 48* 46* 35*  --   CREATININE 3.09* 3.60* 3.11* 2.51*  --   CALCIUM  9.0 9.0 8.5* 8.0*  --   PHOS 3.6 4.9*  --  3.2  --    Liver Function Tests: Recent Labs  Lab 02/16/24 0712 02/17/24 0840 02/21/24 0422  ALBUMIN  2.2* 2.4* 2.0*   No results for input(s): LIPASE, AMYLASE in the last 168 hours. CBC: Recent Labs  Lab 02/16/24 0712 02/17/24 0840 02/19/24 0852 02/21/24 0422 02/21/24 1942  WBC 6.2 8.1 6.8 9.3  --   NEUTROABS  --   --  5.6  --   --   HGB 10.8* 10.1* 9.9* 8.0* 8.5*  HCT 33.9* 31.4* 30.0* 24.3* 25.0*   MCV 99.1 99.7 98.4 98.0  --   PLT 95* 90* 82* 51*  --    Blood Culture    Component Value Date/Time   SDES BLOOD RIGHT HAND 02/21/2024 1042   SDES BLOOD RIGHT HAND 02/21/2024 1042   SPECREQUEST  02/21/2024 1042    BOTTLES DRAWN AEROBIC ONLY Blood Culture results may not be optimal due to an inadequate volume of blood received in culture bottles   SPECREQUEST  02/21/2024 1042    BOTTLES DRAWN AEROBIC AND ANAEROBIC Blood Culture adequate volume   CULT  02/21/2024 1042    NO GROWTH < 24 HOURS Performed at [facility], [address], [place], [place] [address]    CULT  02/21/2024 1042    NO GROWTH < 24 HOURS Performed at [facility], [address], [place], [place] [address]    REPTSTATUS PENDING 02/21/2024 1042   REPTSTATUS PENDING 02/21/2024 1042    Cardiac Enzymes: Recent Labs  Lab 02/18/24 1835  CKTOTAL 35*   CBG: Recent Labs  Lab 02/22/24 0436 02/22/24 0724 02/22/24 0734 02/22/24 0811 02/22/24 1122  GLUCAP 114*  44* 67* 109* 75   Iron  Studies: No results for input(s): IRON , TIBC, TRANSFERRIN, FERRITIN in the last 72 hours. Lab Results  Component Value Date   INR 1.1 01/07/2024   INR 1.1 12/15/2023   INR 1.2 10/08/2023     Medications:  dexmedetomidine (PRECEDEX) IV infusion 0.8 mcg/kg/hr (02/22/24 1000)   dextrose  50 mL/hr at 02/22/24 1000   fentaNYL  infusion INTRAVENOUS 75 mcg/hr (02/22/24 1000)   norepinephrine  (LEVOPHED ) Adult infusion 5 mcg/min (02/22/24 1308)   piperacillin -tazobactam     prismasol BGK 4/2.5     prismasol BGK 4/2.5     prismasol BGK 4/2.5     vancomycin       (feeding supplement) PROSource Plus  30 mL Oral Daily   acetaminophen   1,000 mg Per Tube TID   acyclovir ointment  1 Application Topical 6 X Daily   artificial tears   Right Eye QID   darbepoetin (ARANESP ) injection - DIALYSIS  150 mcg Subcutaneous Q Fri-1800   docusate  100 mg Per Tube BID   dolutegravir   50 mg Per Tube Daily    emtricitabine -tenofovir  AF  1 tablet Per Tube Daily   feeding supplement (NEPRO CARB STEADY)  237 mL Oral BID AC & HS   fentaNYL  (SUBLIMAZE ) injection  25-50 mcg Intravenous Once   gabapentin   100 mg Per Tube Q8H   gatifloxacin   1 drop Right Eye BID   lidocaine   2 patch Transdermal Q24H   methocarbamol   500 mg Per Tube TID   midodrine   10 mg Per Tube TID WC   [START ON 02/24/2024] midodrine   10 mg Per Tube Q T,Th,Sa-HD   multivitamin  1 tablet Oral QHS   mouth rinse  15 mL Mouth Rinse Q2H   pantoprazole  (PROTONIX ) IV  40 mg Intravenous Q12H   polyethylene glycol  17 g Per Tube Daily   rocuronium   50 mg Intravenous Once    Dialysis Orders: [facility] - MWF 3.5h  B400  45.5kg  TDC  Heparin  2000 Mircera 100 mcg - last dose 12/01/23 Calcitriol  1.0 mcg (on hold due to high Ca) Uses midodrine  10mg  TID + extra pre-HD   Assessment/Plan: 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 Has been on long course of IV Vancomycin . Repeat MRI 10/8 showing infection has progressed. ID changed antibiotic to IV daptomycin. NSGY does not think he is a surgical candidate.  Pain management per primary  ESRD: Historically MWF schedule - on TTS this admit.  In setting of emergent intubation/ decompensation will start CRRT.  D/w PCCM, RN, pt's family.  All 4K, no heparin  for now Shock: likely septic- hypoglycemic, intermittent hypothermia, cultures/ abx/ pressors.  Vanc/ Zosyn . In ICU BP/volume: Chronic hypotension on mido 10mg  TID and extra pre-HD. Below EDW now.  Anemia of ESRD: Hgb 9.9 - continue Aranesp  150mcg q Friday. No IV iron  d/t infection. HIV: controlled on Biktarvy  BMD: CorrCa high, VDRA on hold. Phos on goal, on sevelamer  as binder.  Nutrition: Alb low, continue supps and diet has been liberalized. Debility: PT/OT as tolerated. Dispo: Very difficult placement. Noted patient is uninsured thus SNF is not an option.  No family or safe dispo plan at this time.  Almarie Bonine MD [facility] 02/22/2024,1:26 PM

## 2024-02-22 NOTE — Procedures (Signed)
 Intubation Procedure Note  Aymen Arp  [id number]  09-20-1970  Date:02/22/24  Time:1:54 AM   Provider Performing:Dontea Corlew Maree    Procedure: Intubation (31500)  Indication(s) Respiratory Failure, patient self extubated  Consent Unable to obtain consent due to emergent nature of procedure.   Anesthesia Etomidate and Versed    Time Out Verified patient identification, verified procedure, site/side was marked, verified correct patient position, special equipment/implants available, medications/allergies/relevant history reviewed, required imaging and test results available.   Sterile Technique Usual hand hygeine, masks, and gloves were used   Procedure Description Patient positioned in bed supine.  Sedation given as noted above.  Patient was intubated with endotracheal tube using Glidescope.  View was Grade 1 full glottis .  Number of attempts was 1.  Colorimetric CO2 detector was consistent with tracheal placement.   Complications/Tolerance None; patient tolerated the procedure well. Chest X-ray is ordered to verify placement.   EBL 0   Specimen(s) None

## 2024-02-22 NOTE — Progress Notes (Signed)
 Pharmacy Antibiotic Note  Alvin Daniels is a 53 y.o. male admitted on 12/14/2023 with lumbar osteomyelitis, psoas abscess, paraspinal abscess and epidural phlegmon.  Repeat MRI shows new abscess and vancomycin  was switched to Cubicin.  Now with possible septic shock, so Pharmacy has been consulted for vancomycin  and Zosyn  dosing.  Patient has ESRD on TTS HD, transitioning to CRRT given pressor needs.  Plan: Change vanc to 500mg  IV Q24H Zosyn  3.375gm IV Q6H, 30 min infusion Monitor CRRT tolerance, micro data, clinical progress and vanc levels as indicated  Height: 4' 11.02 (149.9 cm) Weight: 46.3 kg (102 lb 1.2 oz) IBW/kg (Calculated) : 47.74  Temp (24hrs), Avg:98.1 F (36.7 C), Min:96.8 F (36 C), Max:99.2 F (37.3 C)  Recent Labs  Lab 02/16/24 0712 02/17/24 0628 02/17/24 0840 02/19/24 0852 02/21/24 0422 02/21/24 0813  WBC 6.2  --  8.1 6.8 9.3  --   CREATININE 3.09*  --  3.60* 3.11* 2.51*  --   LATICACIDVEN  --   --   --   --   --  1.3  VANCORANDOM  --  25  --   --   --   --     Estimated Creatinine Clearance: 22.3 mL/min (A) (by C-G formula based on SCr of 2.51 mg/dL (H)).    No Known Allergies  Vanc 8/5 >> 10/8, 10/11 >> Zosyn  10/11 >>  Dapto 10/8 >> 10/11 Cefepime  8/5 >> 8/28 Cipro  8/14 >> 8/15 Zymaxid  to R eye BID and lubricant QID per Ophth 8/20 >>   10/8 CK = 35    8/4 blood: negative 8/27 surgical/needle aspirate: few MRSA 8/27 disc biopsy by IR +MRSA 10/11 BCx -  10/11 MRSA PCR - negative  Franceska Strahm D. [place], PharmD, BCPS, BCCCP 02/22/2024, 12:08 PM

## 2024-02-22 NOTE — Progress Notes (Signed)
 Yesterday had decompensation requiring intubation and pressor support  No neurosurgical intervention planned. Refer to Dr. Joshua note for rationale

## 2024-02-22 NOTE — Plan of Care (Signed)
  Problem: Education: Goal: Knowledge of General Education information will improve Description: Including pain rating scale, medication(s)/side effects and non-pharmacologic comfort measures Outcome: Not Progressing   Problem: Health Behavior/Discharge Planning: Goal: Ability to manage health-related needs will improve Outcome: Not Progressing   Problem: Clinical Measurements: Goal: Ability to maintain clinical measurements within normal limits will improve Outcome: Not Progressing Goal: Will remain free from infection Outcome: Not Progressing Goal: Diagnostic test results will improve Outcome: Not Progressing Goal: Respiratory complications will improve Outcome: Not Progressing Goal: Cardiovascular complication will be avoided Outcome: Not Progressing   Problem: Activity: Goal: Risk for activity intolerance will decrease Outcome: Not Progressing   Problem: Nutrition: Goal: Adequate nutrition will be maintained Outcome: Not Progressing   Problem: Coping: Goal: Level of anxiety will decrease Outcome: Not Progressing   Problem: Elimination: Goal: Will not experience complications related to bowel motility Outcome: Not Progressing Goal: Will not experience complications related to urinary retention Outcome: Not Progressing   Problem: Pain Managment: Goal: General experience of comfort will improve and/or be controlled Outcome: Not Progressing   Problem: Safety: Goal: Ability to remain free from injury will improve Outcome: Not Progressing   Problem: Skin Integrity: Goal: Risk for impaired skin integrity will decrease Outcome: Not Progressing   Problem: Education: Goal: Ability to describe self-care measures that may prevent or decrease complications (Diabetes Survival Skills Education) will improve Outcome: Not Progressing Goal: Individualized Educational Video(s) Outcome: Not Progressing   Problem: Coping: Goal: Ability to adjust to condition or change in  health will improve Outcome: Not Progressing   Problem: Fluid Volume: Goal: Ability to maintain a balanced intake and output will improve Outcome: Not Progressing   Problem: Health Behavior/Discharge Planning: Goal: Ability to identify and utilize available resources and services will improve Outcome: Not Progressing Goal: Ability to manage health-related needs will improve Outcome: Not Progressing   Problem: Metabolic: Goal: Ability to maintain appropriate glucose levels will improve Outcome: Not Progressing   Problem: Nutritional: Goal: Maintenance of adequate nutrition will improve Outcome: Not Progressing Goal: Progress toward achieving an optimal weight will improve Outcome: Not Progressing   Problem: Skin Integrity: Goal: Risk for impaired skin integrity will decrease Outcome: Not Progressing   Problem: Tissue Perfusion: Goal: Adequacy of tissue perfusion will improve Outcome: Not Progressing   Problem: Safety: Goal: Non-violent Restraint(s) Outcome: Not Progressing

## 2024-02-22 NOTE — Progress Notes (Signed)
 Unable to obtain sputum specimen at this time. Pt has no cough at this time. Specitainer left inline for collection

## 2024-02-22 NOTE — Progress Notes (Signed)
 Patient self extubated at 0123. Nursing and respiratory to bedside. Pt bagged with BVM on 100% oxygen. Provider was notified. Patients O2 sat continued to drop despite bagging and pt became hypotensive. Precedex and fentanyl  were turned off. Provider(Paliwal) entered the room via Elink and gave verbal orders to increase Levophed  to 20 and then 30. Maree Harder, MD to bedside. Verbal order to restart Precedax at 1. Pt reintubated after giving 2mg  versed  and 10mg  Etomidate. Pt was reintubated at 0139.   Alvin Daniels

## 2024-02-22 NOTE — Plan of Care (Signed)

## 2024-02-23 ENCOUNTER — Inpatient Hospital Stay (HOSPITAL_COMMUNITY): Payer: MEDICAID

## 2024-02-23 DIAGNOSIS — R6521 Severe sepsis with septic shock: Secondary | ICD-10-CM

## 2024-02-23 DIAGNOSIS — D696 Thrombocytopenia, unspecified: Secondary | ICD-10-CM

## 2024-02-23 DIAGNOSIS — J9601 Acute respiratory failure with hypoxia: Secondary | ICD-10-CM

## 2024-02-23 LAB — GLUCOSE, CAPILLARY
Glucose-Capillary: 120 mg/dL — ABNORMAL HIGH (ref 70–99)
Glucose-Capillary: 121 mg/dL — ABNORMAL HIGH (ref 70–99)
Glucose-Capillary: 114 mg/dL — ABNORMAL HIGH (ref 70–99)
Glucose-Capillary: 124 mg/dL — ABNORMAL HIGH (ref 70–99)
Glucose-Capillary: 143 mg/dL — ABNORMAL HIGH (ref 70–99)
Glucose-Capillary: 166 mg/dL — ABNORMAL HIGH (ref 70–99)

## 2024-02-23 LAB — POCT I-STAT 7, (LYTES, BLD GAS, ICA,H+H)
Acid-base deficit: 3 mmol/L — ABNORMAL HIGH (ref 0.0–2.0)
Acid-base deficit: 2 mmol/L (ref 0.0–2.0)
Bicarbonate: 21.9 mmol/L (ref 20.0–28.0)
Bicarbonate: 23.7 mmol/L (ref 20.0–28.0)
Calcium, Ion: 1.15 mmol/L (ref 1.15–1.40)
Calcium, Ion: 1.17 mmol/L (ref 1.15–1.40)
HCT: 28 % — ABNORMAL LOW (ref 39.0–52.0)
HCT: 30 % — ABNORMAL LOW (ref 39.0–52.0)
Hemoglobin: 9.5 g/dL — ABNORMAL LOW (ref 13.0–17.0)
Hemoglobin: 10.2 g/dL — ABNORMAL LOW (ref 13.0–17.0)
O2 Saturation: 87 %
O2 Saturation: 98 %
Potassium: 3.6 mmol/L (ref 3.5–5.1)
Potassium: 3.7 mmol/L (ref 3.5–5.1)
Sodium: 132 mmol/L — ABNORMAL LOW (ref 135–145)
Sodium: 132 mmol/L — ABNORMAL LOW (ref 135–145)
TCO2: 23 mmol/L (ref 22–32)
TCO2: 25 mmol/L (ref 22–32)
pCO2 arterial: 39.7 mmHg (ref 32–48)
pCO2 arterial: 43.9 mmHg (ref 32–48)
pH, Arterial: 7.349 — ABNORMAL LOW (ref 7.35–7.45)
pH, Arterial: 7.341 — ABNORMAL LOW (ref 7.35–7.45)
pO2, Arterial: 55 mmHg — ABNORMAL LOW (ref 83–108)
pO2, Arterial: 113 mmHg — ABNORMAL HIGH (ref 83–108)

## 2024-02-23 LAB — TYPE AND SCREEN
ABO/RH(D): O POS
Antibody Screen: NEGATIVE
Unit division: 0

## 2024-02-23 LAB — ECHOCARDIOGRAM COMPLETE
AR max vel: 1.9 cm2
AV Area VTI: 2.12 cm2
AV Area mean vel: 2.01 cm2
AV Mean grad: 3 mmHg
AV Peak grad: 5 mmHg
Ao pk vel: 1.12 m/s
Area-P 1/2: 4.86 cm2
Height: 62 in
S' Lateral: 2.5 cm
Weight: 1724.88 [oz_av]

## 2024-02-23 LAB — RENAL FUNCTION PANEL
Albumin: 2.1 g/dL — ABNORMAL LOW (ref 3.5–5.0)
Albumin: 2 g/dL — ABNORMAL LOW (ref 3.5–5.0)
Anion gap: 9 (ref 5–15)
Anion gap: 10 (ref 5–15)
BUN: 22 mg/dL — ABNORMAL HIGH (ref 6–20)
BUN: 13 mg/dL (ref 6–20)
CO2: 21 mmol/L — ABNORMAL LOW (ref 22–32)
CO2: 22 mmol/L (ref 22–32)
Calcium: 7.7 mg/dL — ABNORMAL LOW (ref 8.9–10.3)
Calcium: 7.6 mg/dL — ABNORMAL LOW (ref 8.9–10.3)
Chloride: 97 mmol/L — ABNORMAL LOW (ref 98–111)
Chloride: 95 mmol/L — ABNORMAL LOW (ref 98–111)
Creatinine, Ser: 1.7 mg/dL — ABNORMAL HIGH (ref 0.61–1.24)
Creatinine, Ser: 1.12 mg/dL (ref 0.61–1.24)
GFR, Estimated: 48 mL/min — ABNORMAL LOW (ref >60)
GFR, Estimated: >60 mL/min (ref >60)
Glucose, Bld: 104 mg/dL — ABNORMAL HIGH (ref 70–99)
Glucose, Bld: 136 mg/dL — ABNORMAL HIGH (ref 70–99)
Phosphorus: 1.8 mg/dL — ABNORMAL LOW (ref 2.5–4.6)
Phosphorus: 1.5 mg/dL — ABNORMAL LOW (ref 2.5–4.6)
Potassium: 3.4 mmol/L — ABNORMAL LOW (ref 3.5–5.1)
Potassium: 3.8 mmol/L (ref 3.5–5.1)
Sodium: 127 mmol/L — ABNORMAL LOW (ref 135–145)
Sodium: 127 mmol/L — ABNORMAL LOW (ref 135–145)

## 2024-02-23 LAB — BPAM RBC
Blood Product Expiration Date: 202511062359
ISSUE DATE / TIME: 202510122014
Unit Type and Rh: 5100

## 2024-02-23 LAB — DIC (DISSEMINATED INTRAVASCULAR COAGULATION)PANEL
D-Dimer, Quant: 3.55 ug{FEU}/mL — ABNORMAL HIGH (ref 0.00–0.50)
Fibrinogen: 390 mg/dL (ref 210–475)
INR: 1.9 — ABNORMAL HIGH (ref 0.8–1.2)
Platelets: 20 K/uL — CL (ref 150–400)
Prothrombin Time: 22.4 s — ABNORMAL HIGH (ref 11.4–15.2)
Smear Review: NONE SEEN
aPTT: 97 s — ABNORMAL HIGH (ref 24–36)

## 2024-02-23 LAB — CBC
HCT: 25.6 % — ABNORMAL LOW (ref 39.0–52.0)
Hemoglobin: 8.7 g/dL — ABNORMAL LOW (ref 13.0–17.0)
MCH: 31.6 pg (ref 26.0–34.0)
MCHC: 34 g/dL (ref 30.0–36.0)
MCV: 93.1 fL (ref 80.0–100.0)
Platelets: 26 K/uL — CL (ref 150–400)
RBC: 2.75 MIL/uL — ABNORMAL LOW (ref 4.22–5.81)
RDW: 19.2 % — ABNORMAL HIGH (ref 11.5–15.5)
WBC: 4 K/uL (ref 4.0–10.5)
nRBC: 0 % (ref 0.0–0.2)

## 2024-02-23 LAB — MAGNESIUM: Magnesium: 1.8 mg/dL (ref 1.7–2.4)

## 2024-02-23 MED ORDER — PROPOFOL 1000 MG/100ML IV EMUL
0.0000 ug/kg/min | INTRAVENOUS | Status: DC
  Administered 2024-02-23 – 2024-02-24 (×2): 40 ug/kg/min via INTRAVENOUS
  Administered 2024-02-24 (×2): 30 ug/kg/min via INTRAVENOUS
  Administered 2024-02-25: 10 ug/kg/min via INTRAVENOUS
  Administered 2024-02-26 – 2024-02-28 (×4): 20 ug/kg/min via INTRAVENOUS
  Administered 2024-02-29: 15 ug/kg/min via INTRAVENOUS
  Filled 2024-02-23 (×13): qty 100

## 2024-02-23 MED ORDER — DEXAMETHASONE SOD PHOSPHATE PF 10 MG/ML IJ SOLN
10.0000 mg | INTRAMUSCULAR | Status: DC
  Administered 2024-02-28 – 2024-03-02 (×4): 10 mg via INTRAVENOUS

## 2024-02-23 MED ORDER — DEXAMETHASONE 4 MG PO TABS: 10.0000 mg | ORAL_TABLET | ORAL | Status: DC

## 2024-02-23 MED ORDER — DEXAMETHASONE SOD PHOSPHATE PF 10 MG/ML IJ SOLN
20.0000 mg | INTRAMUSCULAR | Status: AC
  Administered 2024-02-23 – 2024-02-27 (×5): 20 mg via INTRAVENOUS

## 2024-02-23 MED ORDER — DEXTROSE 20 % IV SOLN
INTRAVENOUS | Status: DC
  Filled 2024-02-23 (×4): qty 500

## 2024-02-23 MED ORDER — POTASSIUM PHOSPHATES 15 MMOLE/5ML IV SOLN
30.0000 mmol | Freq: Once | INTRAVENOUS | Status: DC
  Filled 2024-02-23: qty 10

## 2024-02-23 MED ORDER — ROCURONIUM BROMIDE 10 MG/ML (PF) SYRINGE
PREFILLED_SYRINGE | INTRAVENOUS | Status: AC
  Administered 2024-02-23: 50 mg via INTRAVENOUS
  Filled 2024-02-23: qty 10

## 2024-02-23 MED ORDER — POTASSIUM PHOSPHATES 15 MMOLE/5ML IV SOLN
45.0000 mmol | Freq: Once | INTRAVENOUS | Status: AC
  Administered 2024-02-23: 45 mmol via INTRAVENOUS
  Filled 2024-02-23: qty 15

## 2024-02-23 MED ORDER — DEXAMETHASONE 4 MG PO TABS: 20.0000 mg | ORAL_TABLET | ORAL | Status: DC

## 2024-02-23 MED ORDER — NOREPINEPHRINE 16 MG/250ML-% IV SOLN
0.0000 ug/min | INTRAVENOUS | Status: DC
  Administered 2024-02-23: 14 ug/min via INTRAVENOUS
  Filled 2024-02-23 (×2): qty 250

## 2024-02-23 MED ORDER — POTASSIUM PHOSPHATES 15 MMOLE/5ML IV SOLN
15.0000 mmol | Freq: Once | INTRAVENOUS | Status: AC
  Administered 2024-02-23: 15 mmol via INTRAVENOUS
  Filled 2024-02-23: qty 5

## 2024-02-23 MED ORDER — ROCURONIUM BROMIDE 10 MG/ML (PF) SYRINGE: 50.0000 mg | PREFILLED_SYRINGE | Freq: Once | INTRAVENOUS | Status: AC

## 2024-02-23 MED ORDER — PROPOFOL 1000 MG/100ML IV EMUL
INTRAVENOUS | Status: AC
Start: 2024-02-23 — End: 2024-02-23
  Filled 2024-02-23: qty 100

## 2024-02-23 NOTE — Consult Note (Signed)
 WOC Nurse Consult Note: Reason for Consult: clarification on staging of the sacral wound Discoloration that is darker than skin tone and darker than previous images taken during this admission.  In persons of color, DTPI's are difficult to assess, in looking at the comparison photos it does appear to be a change.  Patient was noted to have open area on the sacrum 9/15 which has re-epithelize  Wound type:Lower sacrum and bilateral upper buttock DTPI (Deep Tissue Pressure Injury)  Pressure Injury POA: No Measurement:see nursing flow sheets; extends over the buttocks and sacrum  Wound bed: dark purple/darker discoloration of the skin  Drainage (amount, consistency, odor) none Periwound: intact  Dressing procedure/placement/frequency: Cover affected area with single layer of xeroform; top with foam. Change xeroform daily for assessment, ok to leave foam in place and lift daily. LALM in place for moisture management and pressure redistribution.   WOC Nurse team will follow with you and see patient within 10 days for wound assessments.  Please notify WOC nurses of any acute changes in the wounds or any new areas of concern Stehanie Ekstrom [facility] MSN, RN,CWOCN, CNS, CWON-AP [phone]

## 2024-02-23 NOTE — Progress Notes (Signed)
 eLink Physician-Brief Progress Note Patient Name: Alvin Daniels DOB: 1970/05/23 MRN: [id number]   Date of Service  02/23/2024  HPI/Events of Note  Notified that levophed  had no BP parameters.   eICU Interventions  Levophed  order changed to include goal of MAP >65.      Intervention Category Intermediate Interventions: Hypotension - evaluation and management  Shanda Busman 02/23/2024, 9:25 PM  5:57 AM ABG ordered for this morning.

## 2024-02-23 NOTE — Progress Notes (Signed)
 Dr. Gearline from Nephrology has been informed about the results of the patient's morning labs.

## 2024-02-23 NOTE — Progress Notes (Signed)
 NAMEDelman Daniels, MRN:  [id number], DOB:  18-Dec-1970, LOS: 70 ADMISSION DATE:  12/14/2023, CONSULTATION DATE:  02/23/2024 REFERRING MD:  MELODIE, CHIEF COMPLAINT:  septic shock, respiratory failure   History of Present Illness:   Alvin Daniels is a 53 y.o. male with medical history significant of ESRD on HD MWF, type 2 diabetes mellitus poorly controlled, HIV, chronic hepatitis C, PAD s/p bilateral BKA, and recent admission for MRSA septicemia, possible IE, and L middle finger OM s/p amputation per Ortho surgery on 5/29 (cultures grew E. 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   Pt was in USOH until this past week when he had difficulty walking due to the pain in his R hip. The pain would bother him all day, but he was able to walk most of the day and rest in the evening. Yesterday, the pain was so bad that he was unable to walk at all, so he presented to the ED for further evaluation. Of note, pt was recently admitted for MRSA septicemia and possible IE and was continued on IV vancomycin  w/ HD until 7/19, and d/c recommended repeat TEE to eval for clearance of IE.   In the ED, pt AFVSS. Labs showed K 3.4, Cr 9.06, CRP 15.2, and ESR 133. MRI sacrum showed discitis and osteomyelitis at L4-5 with associated paraspinal inflammatory changes and right psoas Abscess. EDP ordered IV vancomycin /cefepime , and requested medicine admission.    Patient was brought into ICU because of increasing vasopressor needs.  He was eventually intubated.  Pertinent  Medical History  ESRD DM HIV Chronic Hep C PAD s/p bilateral BKA  Significant Hospital Events: Including procedures, antibiotic start and stop dates in addition to other pertinent events   02/21/2024 intubated for respiratory failure and vasopressor therapy was increasing during the day because of septic shock.   Interim History / Subjective:   Vasopressors: Levo 10 mcg Sedation: Dex 1.2, Fentanyl  150 MV: PRVC 380/+8/70% --> +10/55%  Objective    Blood pressure 117/60, pulse 97, temperature 97.6 F (36.4 C), resp. rate 18, height 5' 2 (1.575 m), weight 48.9 kg, SpO2 (!) 86%.    Vent Mode: PRVC FiO2 (%):  [55 %-80 %] 55 % Set Rate:  [20 bmp] 20 bmp Vt Set:  [380 mL] 380 mL PEEP:  [8 cmH20-10 cmH20] 10 cmH20 Plateau Pressure:  [17 cmH20-20 cmH20] 20 cmH20   Intake/Output Summary (Last 24 hours) at 02/23/2024 1018 Last data filed at 02/23/2024 0900 Gross per 24 hour  Intake 4115.17 ml  Output 3777 ml  Net 338.17 ml   Filed Weights   02/19/24 0834 02/21/24 0859 02/23/24 0306  Weight: 46.6 kg 46.3 kg 48.9 kg    Examination: General: cachectic, chronically ill appearing, deformity in R head area HENT: pupils pinpoint, sluggish Lungs: Crackles diffusely in all lobes Cardiovascular: tachycardic, no murmurs Abdomen: soft, difficult to assess tenderness, ecchymoses noted in lower abdomen. Extremities: 2+ sacral edema, bilateral BKAs Neuro: obtunded, not response to commands, no response to noxious stimulation.  Resolved problem list   Assessment and Plan   #Septic Shock: likely septic in the setting of psoas abscess and discitis - Continue Levophed  and wean as tolerated - Continue Midodrine  10 mg TID - Agree with neurosurgery consult for source control - Agree with ID consult for antimicrobial plan. - Target MAP > 65 mmHg  #Acute Hypoxic Respiratory Failure: concern for ARDS versus pulmonary edema -  Prevent lung injury - Low tidal volume ventilation - Aim for Pplat < 30 and DP < 15 - Lowest FiO2 to keep SPO2 > 90% and PaO2 > 65 mmHg - Permissive hypercapnia - pH goal > 7.2 - Daily SAT/SBT - ABG --> P/F < 150, consider proning after family discussion. - DEXA ARDS steroids - Aggressive volume removal with CRRT  #MRSA Discitis/Psoas Abscess: - Continue Vancomycin , no end date for now given that there's no  surgical intervention at this time.  # GNR growing in trach aspirate: - Continue Zosyn  (end date: 02/27/2024)  #Stress Ulcer PPx: PPI  #Nutrition: NPO, place tube on LIS due to gastric distention.  #ESRD: - Continue CRRT - Renally adjust medications - Avoid Nephrotoxins - Strict I/Os - Foley catheter  #VTE ppx: holding heparin  subQ due to thrombocytopenia  #Thrombocytopenia: likely DIC - Continue to monitor PLT  - Obtain DIC lab  #Hypoglycemia: - Continue D20 at 30 cc/hr  #PADs: - Precedex - Fentanyl   #GOC: I called patient's brother and noted that patient is in multiorgan failure and is likely going to die despite being on full life support. The patient's brother was at his work place and it was loud so was not able to have a full GOC conversation at this time. He noted that their brother will come in today to discuss further.  Disposition: ICU appropriate for respiratory failure and shock state.  Labs   CBC: Recent Labs  Lab 02/17/24 0840 02/19/24 0852 02/21/24 0422 02/21/24 1942 02/22/24 1649 02/23/24 0120 02/23/24 0905  WBC 8.1 6.8 9.3  --  7.0 4.0  --   NEUTROABS  --  5.6  --   --  6.2  --   --   HGB 10.1* 9.9* 8.0* 8.5* 6.7* 8.7* 9.5*  HCT 31.4* 30.0* 24.3* 25.0* 20.2* 25.6* 28.0*  MCV 99.7 98.4 98.0  --  98.1 93.1  --   PLT 90* 82* 51*  --  33* 26*  --     Basic Metabolic Panel: Recent Labs  Lab 02/17/24 0840 02/19/24 0852 02/21/24 0422 02/21/24 1942 02/22/24 1600 02/23/24 0120 02/23/24 0905  NA 129* 126* 127* 130* 125* 127* 132*  K 5.4* 4.5 3.6 3.4* 3.5 3.4* 3.6  CL 93* 91* 91*  --  94* 97*  --   CO2 22 24 24   --  20* 21*  --   GLUCOSE 84 66* 115*  --  103* 104*  --   BUN 48* 46* 35*  --  35* 22*  --   CREATININE 3.60* 3.11* 2.51*  --  2.54* 1.70*  --   CALCIUM  9.0 8.5* 8.0*  --  7.6* 7.7*  --   MG  --   --   --   --   --  1.8  --   PHOS 4.9*  --  3.2  --  2.5 1.8*  --    GFR: Estimated Creatinine Clearance: 34.8 mL/min (A) (by C-G  formula based on SCr of 1.7 mg/dL (H)). Recent Labs  Lab 02/19/24 0852 02/21/24 0422 02/21/24 0813 02/22/24 1649 02/23/24 0120  WBC 6.8 9.3  --  7.0 4.0  LATICACIDVEN  --   --  1.3  --   --     Liver Function Tests: Recent Labs  Lab 02/17/24 0840 02/21/24 0422 02/22/24 1600 02/23/24 0120  ALBUMIN  2.4* 2.0* 2.1* 2.1*   No results for input(s): LIPASE, AMYLASE in the last 168 hours. No results for input(s): AMMONIA in the last  168 hours.  ABG    Component Value Date/Time   PHART 7.349 (L) 02/23/2024 0905   PCO2ART 39.7 02/23/2024 0905   PO2ART 55 (L) 02/23/2024 0905   HCO3 21.9 02/23/2024 0905   TCO2 23 02/23/2024 0905   ACIDBASEDEF 3.0 (H) 02/23/2024 0905   O2SAT 87 02/23/2024 0905     Coagulation Profile: No results for input(s): INR, PROTIME in the last 168 hours.  Cardiac Enzymes: Recent Labs  Lab 02/18/24 1835  CKTOTAL 35*    HbA1C: Hemoglobin A1C  Date/Time Value Ref Range Status  07/17/2023 04:28 PM 10.0 (A) 4.0 - 5.6 % Final  01/29/2023 03:22 PM 8.5 (A) 4.0 - 5.6 % Final   HbA1c, POC (controlled diabetic range)  Date/Time Value Ref Range Status  09/28/2018 02:21 PM 6.8 0.0 - 7.0 % Final   Hgb A1c MFr Bld  Date/Time Value Ref Range Status  01/28/2024 04:00 AM 5.9 (H) 4.8 - 5.6 % Final    Comment:    (NOTE) Diagnosis of Diabetes The following HbA1c ranges recommended by the American Diabetes Association (ADA) may be used as an aid in the diagnosis of diabetes mellitus.  Hemoglobin             Suggested A1C NGSP%              Diagnosis  <5.7                   Non Diabetic  5.7-6.4                Pre-Diabetic  >6.4                   Diabetic  <7.0                   Glycemic control for                       adults with diabetes.    08/01/2022 11:53 PM 10.7 (H) 4.8 - 5.6 % Final    Comment:    (NOTE)         Prediabetes: 5.7 - 6.4         Diabetes: >6.4         Glycemic control for adults with diabetes: <7.0      CBG: Recent Labs  Lab 02/22/24 1548 02/22/24 1942 02/22/24 2310 02/23/24 0422 02/23/24 0711  GLUCAP 139* 94 103* 120* 121*    Review of Systems:   Not obtained  Past Medical History:  He,  has a past medical history of Acute bilateral low back pain (01/03/2021), AIDS ([name]) (11/22/2014), Back pain (06/09/2019), BKA stump complication ([name]) (06/09/2019), Chronic diarrhea, Chronic hepatitis C without hepatic coma ([name]) (11/22/2014), 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[name]), Corneal ulcer (12/18/2022), 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   Surgical History:   Past Surgical History:  Procedure Laterality Date   AMPUTATION Left 04/20/2014   Procedure: 3rd toe amputation, 4th Toe Amputation,  5th Toe Amputation;  Surgeon: Jerona Harden GAILS, MD;  Location: [facility] OR;  Service: Orthopedics;  Laterality: Left;   AMPUTATION Left 05/02/2014   Procedure: Midfoot Amputation;  Surgeon: Jerona Harden GAILS, MD;  Location: [facility] OR;  Service: Orthopedics;  Laterality: Left;   AMPUTATION Left 06/17/2014   Procedure: AMPUTATION BELOW KNEE;  Surgeon: Jerona Harden GAILS, MD;  Location: [facility] OR;  Service: Orthopedics;  Laterality: Left;   AMPUTATION Right 09/04/2018   Procedure: RIGHT BELOW KNEE AMPUTATION;  Surgeon: Harden Jerona GAILS, MD;  Location[facility] OR;  Service: Orthopedics;  Laterality: Right;  RIGHT BELOW KNEE AMPUTATION   AMPUTATION FINGER Left 10/09/2023   Procedure: AMPUTATION, FINGER;  Surgeon: Romona Harari, MD;  Location: [facility] OR;  Service: Orthopedics;  Laterality: Left;  Left  Ring Finger amputation   AV FISTULA PLACEMENT Left    AV FISTULA PLACEMENT Left 05/10/2016   Procedure: Creation Left Arm Brachiocephalic Arteriovenous Fistula and Ligation of Radiocephalic Fistula;  Surgeon: Lonni GORMAN Blade, MD;  Location: [facility] OR;  Service: Vascular;  Laterality: Left;   DIALYSIS/PERMA CATHETER INSERTION N/A 09/01/2023   Procedure: DIALYSIS/PERMA CATHETER INSERTION;  Surgeon: Melia Lynwood ORN, MD;  Location: [facility] INVASIVE CV LAB;  Service: Cardiovascular;  Laterality: N/A;   FEMUR IM NAIL Left 08/17/2016   Procedure: INTRAMEDULLARY (IM) RETROGRADE FEMORAL NAILING;  Surgeon: Oneil JAYSON Herald, MD;  Location: [facility] OR;  Service: Orthopedics;  Laterality: Left;   FOOT AMPUTATION THROUGH ANKLE Left 12/'21/2015   midfoot   IR AV DIALY SHUNT INTRO NEEDLE/INTRACATH INITIAL W/PTA/IMG LEFT  04/17/2023   IR FLUORO GUIDE CV LINE RIGHT  10/14/2023   IR FLUORO GUIDE CV LINE RIGHT  11/11/2023   IR GENERIC HISTORICAL Left 05/01/2016   IR THROMBECTOMY AV FISTULA W/THROMBOLYSIS/PTA INC/SHUNT/IMG LEFT 05/01/2016 Toribio Faes, MD [facility]   IR GENERIC HISTORICAL  05/01/2016   IR US  GUIDE VASC ACCESS LEFT 05/01/2016 Toribio Faes, MD [facility]   IR GENERIC HISTORICAL  05/07/2016   IR FLUORO GUIDE CV LINE RIGHT 05/07/2016 Ami Bellman, DO [facility]   IR GENERIC HISTORICAL  05/07/2016   IR US  GUIDE VASC ACCESS RIGHT 05/07/2016 Ami Bellman, DO [facility]   IR GENERIC HISTORICAL  05/22/2016   IR US  GUIDE VASC ACCESS RIGHT 05/22/2016 Ozell Specking, MD [facility]   IR GENERIC HISTORICAL  05/22/2016   IR FLUORO GUIDE CV LINE RIGHT 05/22/2016 Ozell Specking, MD [facility]   IR LUMBAR DISC ASPIRATION W/IMG GUIDE  01/07/2024   IR REMOVAL TUN CV CATH W/O FL  08/21/2016   IR REMOVAL TUN CV CATH W/O FL  10/12/2023   IR US  GUIDE VASC ACCESS LEFT  04/17/2023   IR US  GUIDE VASC ACCESS RIGHT  10/14/2023   PERIPHERAL VASCULAR CATHETERIZATION Left 05/09/2016   Procedure: A/V Fistulagram;  Surgeon: Lonni GORMAN Blade, MD;  Location: [facility] INVASIVE CV LAB;  Service: Cardiovascular;  Laterality: Left;  arm   TEE WITHOUT CARDIOVERSION N/A 06/22/2018   Procedure: TRANSESOPHAGEAL ECHOCARDIOGRAM (TEE);  Surgeon: Jeffrie Oneil JAYSON, MD;  Location: [facility] ENDOSCOPY;  Service: Cardiovascular;  Laterality: N/A;   TRANSESOPHAGEAL ECHOCARDIOGRAM (CATH LAB) N/A 10/15/2023   Procedure: TRANSESOPHAGEAL ECHOCARDIOGRAM;  Surgeon: Rolan Ezra GORMAN, MD;  Location: [facility] INVASIVE CV LAB;  Service: Cardiovascular;  Laterality: N/A;     Social History:   reports that he has never smoked. He has never used smokeless tobacco. He reports that he does not drink alcohol  and does not use drugs.   Family History:  His family history includes Diabetes in his father and mother.   Allergies No Known Allergies   Home Medications  Prior to Admission medications   Medication Sig Start Date End Date Taking? Authorizing Provider  bictegravir-emtricitabine -tenofovir  AF (  BIKTARVY ) 50-200-25 MG TABS tablet Take 1 tablet by mouth daily. 04/16/23  Yes Fleeta Rothman, Jomarie SAILOR, MD  insulin  aspart protamine  - aspart (NOVOLOG  70/30 FLEXPEN) (70-30) 100 UNIT/ML FlexPen Inject 20 Units into the skin 2 (two) times daily. Patient taking differently: Inject 10 Units into the skin 2 (two) times daily. 07/17/23  Yes Tanda Bleacher, MD  ZYPITAMAG  4 MG TABS Take by mouth. 11/26/23  Yes [provider]  gabapentin  (NEURONTIN ) 100 MG capsule Take 1 capsule (100 mg total) by mouth at bedtime. Patient not taking: Reported on 12/16/2023 10/17/23 01/15/24  [place], Camellia PARAS, DO  HYDROcodone -acetaminophen  (NORCO/VICODIN) 5-325 MG tablet Take 1 tablet by mouth every 6 (six) hours as needed for severe pain (pain score 7-10) or moderate pain (pain score 4-6). Patient not taking: Reported on 12/15/2023 10/17/23   [place], Camellia PARAS, DO  midodrine  (PROAMATINE ) 10 MG tablet Take 1 tablet (10 mg total) by mouth 3 (three) times daily as needed for low blood pressure, SBP</=100. may take 30  min prior to dialysis and mid treatment as needed. Patient not taking: Reported on 12/16/2023 09/05/23     bictegravir-emtricitabine -tenofovir  AF (BIKTARVY ) 50-200-25 MG TABS tablet Take 1 tablet by mouth daily. 01/02/22   Fleeta Rothman Jomarie SAILOR, MD     Due to a high probability of clinically significant, life threatening deterioration, the patient required my highest level of preparedness to intervene emergently and I personally spent this critical care time directly and personally managing the patient. This critical care time included obtaining a history; examining the patient; pulse oximetry; ordering and review of studies; arranging urgent treatment with development of a management plan; evaluation of patient's response to treatment; frequent reassessment; and, discussions with other providers.  This critical care time was performed to assess and manage the high probability of imminent, life-threatening deterioration that could result in multi-organ failure. It was exclusive of separately billable procedures and treating other patients and teaching time. Critical Care Time: 45 minutes.  Paula Southerly, MD  Pulmonary and Critical Care

## 2024-02-23 NOTE — Progress Notes (Signed)
  [facility] Progress Note  Subjective:  Yest I/O were + 1.4 L  Today I/O is net neg 740 cc so far  Vitals:   02/23/24 0742 02/23/24 0800 02/23/24 0831 02/23/24 0900  BP:      Pulse:  99 97   Resp: 16 20 18 18   Temp:      TempSrc:      SpO2:  100% (!) 86%   Weight:      Height:        Exam: General: Chronically ill, frail, sedated on vent Heart: RRR Lungs: CTAB Abdomen: soft Extremities: no LE edema, B BKA Dialysis Access: TDC intact    OP HD: [facility] - MWF 3.5h  B400  45.5kg  TDC  Heparin  2000 Mircera 100 mcg - last dose 12/01/23 Calcitriol  1.0 mcg (on hold due to high Ca) Uses midodrine  10mg  TID + extra pre-HD    Assessment/ Plan: 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 Has been on long course of IV Vancomycin . Repeat MRI 10/8 showing infection has progressed. ID changed antibiotic to IV daptomycin. NSGY does not think he is a surgical candidate.  See #10 below also.  ESRD: MWF HD, getting HD TTS though this admit.  Acute decompensation causing need for CRRT started 10/12.  Shock: likely septic- hypoglycemic, intermittent hypothermia, cultures/ abx/ pressors.  Vanc/ Zosyn . In ICU BP/volume: Chronic hypotension on mido 10mg  TID and extra pre-HD. Below EDW now.  Anemia of ESRD: Hgb 9.9 - continue Aranesp  150mcg q Friday. No IV iron  d/t infection. HIV: controlled on Biktarvy  BMD: CorrCa high, VDRA on hold. Phos on goal, on sevelamer  as binder.  Nutrition: Alb low, continue supps and diet has been liberalized. Debility: PT/OT as tolerated. Dispo: patient is extremely ill and has failed a prolonged course of IV abx for his L4-5 discitis/ osteomyelitis. He was in severe, debilitating pain for most of this time and now a recent f/u MRI 10/8 is showing that the infection is progressing. Don't see chance for a good outcome; very poor prognosis overall - have d/w CCM and RN.    Myer Fret MD  [facility] 02/23/2024, 10:58  AM  Recent Labs  Lab 02/22/24 1600 02/22/24 1649 02/23/24 0120 02/23/24 0905  HGB  --    < > 8.7* 9.5*  ALBUMIN  2.1*  --  2.1*  --   CALCIUM  7.6*  --  7.7*  --   PHOS 2.5  --  1.8*  --   CREATININE 2.54*  --  1.70*  --   K 3.5  --  3.4* 3.6   < > = values in this interval not displayed.   No results for input(s): IRON , TIBC, FERRITIN in the last 168 hours. Inpatient medications:  acetaminophen   1,000 mg Per Tube TID   acyclovir ointment  1 Application Topical 6 X Daily   artificial tears   Right Eye QID   Chlorhexidine  Gluconate Cloth  6 each Topical Daily   darbepoetin (ARANESP ) injection - DIALYSIS  150 mcg Subcutaneous Q Fri-1800   dexamethasone  (DECADRON ) injection  20 mg Intravenous Q24H   Followed by   NOREEN ON 02/28/2024] dexamethasone  (DECADRON ) injection  10 mg Intravenous Q24H   docusate  100 mg Per Tube BID   dolutegravir   50 mg Per Tube Daily   emtricitabine -tenofovir  AF  1 tablet Per Tube Daily   feeding supplement (PROSource TF20)  60 mL Per Tube Daily   fentaNYL  (SUBLIMAZE ) injection  25-50 mcg Intravenous Once  gabapentin   100 mg Per Tube Q8H   gatifloxacin   1 drop Right Eye BID   lidocaine   2 patch Transdermal Q24H   methocarbamol   500 mg Per Tube TID   midodrine   10 mg Per Tube TID WC   [START ON 02/24/2024] midodrine   10 mg Per Tube Q T,Th,Sa-HD   multivitamin  1 tablet Per Tube QHS   mouth rinse  15 mL Mouth Rinse Q2H   pantoprazole  (PROTONIX ) IV  40 mg Intravenous Q12H   polyethylene glycol  17 g Per Tube Daily   rocuronium   50 mg Intravenous Once   sodium chloride  flush  10-40 mL Intracatheter Q12H   thiamine  100 mg Per Tube Daily    dexmedetomidine (PRECEDEX) IV infusion 1.2 mcg/kg/hr (02/23/24 0900)   dextrose      feeding supplement (OSMOLITE 1.5 CAL) Stopped (02/23/24 0910)   fentaNYL  infusion INTRAVENOUS 150 mcg/hr (02/23/24 0900)   norepinephrine  (LEVOPHED ) Adult infusion 8 mcg/min (02/23/24 0900)   piperacillin -tazobactam 3.375 g  (02/23/24 0933)   prismasol BGK 4/2.5 400 mL/hr at 02/23/24 0301   prismasol BGK 4/2.5 1,500 mL/hr at 02/23/24 0751   prismasol BGK 4/2.5 400 mL/hr at 02/23/24 0301   propofol  (DIPRIVAN ) infusion 20 mcg/kg/min (02/23/24 1047)   vancomycin  Stopped (02/22/24 1635)   dextrose , fentaNYL , heparin , hydrALAZINE , ibuprofen , ipratropium-albuterol , lip balm, metoprolol  tartrate, midazolam , ondansetron  (ZOFRAN ) IV, mouth rinse, oxyCODONE , sodium chloride , sodium chloride  flush

## 2024-02-23 NOTE — Plan of Care (Signed)
  Problem: Clinical Measurements: Goal: Ability to maintain clinical measurements within normal limits will improve Outcome: Progressing   Problem: Clinical Measurements: Goal: Will remain free from infection Outcome: Progressing   Problem: Clinical Measurements: Goal: Diagnostic test results will improve Outcome: Progressing   Problem: Clinical Measurements: Goal: Respiratory complications will improve Outcome: Progressing   Problem: Clinical Measurements: Goal: Cardiovascular complication will be avoided Outcome: Progressing   Problem: Elimination: Goal: Will not experience complications related to bowel motility Outcome: Progressing   Problem: Pain Managment: Goal: General experience of comfort will improve and/or be controlled Outcome: Progressing   Problem: Safety: Goal: Ability to remain free from injury will improve Outcome: Progressing   Problem: Skin Integrity: Goal: Risk for impaired skin integrity will decrease Outcome: Progressing   Problem: Metabolic: Goal: Ability to maintain appropriate glucose levels will improve Outcome: Progressing   Problem: Tissue Perfusion: Goal: Adequacy of tissue perfusion will improve Outcome: Progressing

## 2024-02-23 NOTE — IPAL (Signed)
  Interdisciplinary Goals of Care Family Meeting   Date carried out:: 02/23/2024  Location of the meeting: Bedside  Member's involved: Physician, Bedside Registered Nurse, Social Worker, and Family Member or next of kin  Durable Power of Attorney or acting medical decision maker: 2 Brothers, Facilities manager and Alvin Daniels    Discussion: We discussed goals of care for Alvin Daniels with brother Alvin Daniels. [name] is not available at this time due to being at work. I informed him that the patient is in septic shock with multiorgan failure due to infection in his spine and psoas muscles. The patient is not an operative candidate and that we are keeping him alive with artificial measures including CRRT, ventilator, and vasopressors. I recommended we switch focus of care from life sustaining measures to comfort measures and informed him that the patient is likely to DIE and not make it during this hospitalization. Alvin Daniels reported that he would like to talk to [facility] before making major decisions regarding withdrawal of life sustaining measures which is reasonable. I recommended that we switch the code status of patient from full code to limited code DNR given that chest compressions are unlikely to help improve the patient's situation and would only add discomfort to the last moments of his life. Alvin Daniels agrees. Code status changed to DNR.  Code status: Limited Code or DNR with short term  Disposition: Continue current acute care  Time spent for the meeting: 25 minutes.  Alvin Daniels 02/23/2024, 3:52 PM

## 2024-02-23 NOTE — Progress Notes (Signed)
 Date and time results received: 02/23/24 0211   Test: Platelet Critical Value: 26  Name of Provider Notified: Fate, MD  Alvin Daniels

## 2024-02-24 ENCOUNTER — Inpatient Hospital Stay (HOSPITAL_COMMUNITY): Payer: MEDICAID

## 2024-02-24 LAB — CBC WITH DIFFERENTIAL/PLATELET
Basophils Absolute: 0 K/uL (ref 0.0–0.1)
Basophils Relative: 0 %
Eosinophils Absolute: 0 K/uL (ref 0.0–0.5)
Eosinophils Relative: 0 %
HCT: 23.9 % — ABNORMAL LOW (ref 39.0–52.0)
Hemoglobin: 8.2 g/dL — ABNORMAL LOW (ref 13.0–17.0)
Lymphocytes Relative: 1 %
Lymphs Abs: 0.1 K/uL — ABNORMAL LOW (ref 0.7–4.0)
MCH: 32.2 pg (ref 26.0–34.0)
MCHC: 34.3 g/dL (ref 30.0–36.0)
MCV: 93.7 fL (ref 80.0–100.0)
Monocytes Absolute: 0.1 K/uL (ref 0.1–1.0)
Monocytes Relative: 1 %
Neutro Abs: 5.9 K/uL (ref 1.7–7.7)
Neutrophils Relative %: 98 %
Platelets: 37 K/uL — ABNORMAL LOW (ref 150–400)
RBC: 2.55 MIL/uL — ABNORMAL LOW (ref 4.22–5.81)
RDW: 20.4 % — ABNORMAL HIGH (ref 11.5–15.5)
WBC: 6 K/uL (ref 4.0–10.5)
nRBC: 0 % (ref 0.0–0.2)

## 2024-02-24 LAB — CBC
HCT: 24.4 % — ABNORMAL LOW (ref 39.0–52.0)
Hemoglobin: 8.5 g/dL — ABNORMAL LOW (ref 13.0–17.0)
MCH: 32.2 pg (ref 26.0–34.0)
MCHC: 34.8 g/dL (ref 30.0–36.0)
MCV: 92.4 fL (ref 80.0–100.0)
Platelets: 12 K/uL — CL (ref 150–400)
RBC: 2.64 MIL/uL — ABNORMAL LOW (ref 4.22–5.81)
RDW: 20.4 % — ABNORMAL HIGH (ref 11.5–15.5)
WBC: 6.3 K/uL (ref 4.0–10.5)
nRBC: 0 % (ref 0.0–0.2)

## 2024-02-24 LAB — RENAL FUNCTION PANEL
Albumin: 1.8 g/dL — ABNORMAL LOW (ref 3.5–5.0)
Albumin: 2 g/dL — ABNORMAL LOW (ref 3.5–5.0)
Anion gap: 13 (ref 5–15)
Anion gap: 9 (ref 5–15)
BUN: 8 mg/dL (ref 6–20)
BUN: 8 mg/dL (ref 6–20)
CO2: 20 mmol/L — ABNORMAL LOW (ref 22–32)
CO2: 24 mmol/L (ref 22–32)
Calcium: 7.6 mg/dL — ABNORMAL LOW (ref 8.9–10.3)
Calcium: 7.9 mg/dL — ABNORMAL LOW (ref 8.9–10.3)
Chloride: 98 mmol/L (ref 98–111)
Chloride: 98 mmol/L (ref 98–111)
Creatinine, Ser: 0.86 mg/dL (ref 0.61–1.24)
Creatinine, Ser: 0.69 mg/dL (ref 0.61–1.24)
GFR, Estimated: >60 mL/min (ref >60)
GFR, Estimated: >60 mL/min (ref >60)
Glucose, Bld: 183 mg/dL — ABNORMAL HIGH (ref 70–99)
Glucose, Bld: 156 mg/dL — ABNORMAL HIGH (ref 70–99)
Phosphorus: 2.6 mg/dL (ref 2.5–4.6)
Phosphorus: 2.5 mg/dL (ref 2.5–4.6)
Potassium: 4.4 mmol/L (ref 3.5–5.1)
Potassium: 4.3 mmol/L (ref 3.5–5.1)
Sodium: 131 mmol/L — ABNORMAL LOW (ref 135–145)
Sodium: 131 mmol/L — ABNORMAL LOW (ref 135–145)

## 2024-02-24 LAB — CULTURE, RESPIRATORY W GRAM STAIN: Culture: NORMAL

## 2024-02-24 LAB — TYPE AND SCREEN
ABO/RH(D): O POS
Antibody Screen: NEGATIVE

## 2024-02-24 LAB — POCT I-STAT 7, (LYTES, BLD GAS, ICA,H+H)
Acid-base deficit: 1 mmol/L (ref 0.0–2.0)
Bicarbonate: 22.7 mmol/L (ref 20.0–28.0)
Calcium, Ion: 1.16 mmol/L (ref 1.15–1.40)
HCT: 26 % — ABNORMAL LOW (ref 39.0–52.0)
Hemoglobin: 8.8 g/dL — ABNORMAL LOW (ref 13.0–17.0)
O2 Saturation: 99 %
Patient temperature: 97.7
Potassium: 4.2 mmol/L (ref 3.5–5.1)
Sodium: 134 mmol/L — ABNORMAL LOW (ref 135–145)
TCO2: 24 mmol/L (ref 22–32)
pCO2 arterial: 33.4 mmHg (ref 32–48)
pH, Arterial: 7.438 (ref 7.35–7.45)
pO2, Arterial: 119 mmHg — ABNORMAL HIGH (ref 83–108)

## 2024-02-24 LAB — GLUCOSE, CAPILLARY
Glucose-Capillary: 165 mg/dL — ABNORMAL HIGH (ref 70–99)
Glucose-Capillary: 166 mg/dL — ABNORMAL HIGH (ref 70–99)
Glucose-Capillary: 180 mg/dL — ABNORMAL HIGH (ref 70–99)
Glucose-Capillary: 162 mg/dL — ABNORMAL HIGH (ref 70–99)
Glucose-Capillary: 143 mg/dL — ABNORMAL HIGH (ref 70–99)
Glucose-Capillary: 161 mg/dL — ABNORMAL HIGH (ref 70–99)

## 2024-02-24 LAB — MAGNESIUM: Magnesium: 2.1 mg/dL (ref 1.7–2.4)

## 2024-02-24 MED ORDER — INSULIN ASPART 100 UNIT/ML IJ SOLN
0.0000 [IU] | Freq: Three times a day (TID) | INTRAMUSCULAR | Status: DC
  Administered 2024-02-26 – 2024-02-27 (×2): 1 [IU] via SUBCUTANEOUS

## 2024-02-24 MED ORDER — K PHOS MONO-SOD PHOS DI & MONO 155-852-130 MG PO TABS
500.0000 mg | ORAL_TABLET | ORAL | Status: AC
  Administered 2024-02-24 (×4): 500 mg
  Filled 2024-02-24 (×4): qty 2

## 2024-02-24 MED ORDER — SODIUM CHLORIDE 0.9% IV SOLUTION: Freq: Once | INTRAVENOUS | Status: AC

## 2024-02-24 MED ORDER — OSMOLITE 1.5 CAL PO LIQD
1000.0000 mL | ORAL | Status: DC
  Administered 2024-02-24 – 2024-02-28 (×4): 1000 mL

## 2024-02-24 MED ORDER — INSULIN ASPART 100 UNIT/ML IJ SOLN: 0.0000 [IU] | Freq: Every day | INTRAMUSCULAR | Status: DC

## 2024-02-24 NOTE — Plan of Care (Signed)
  Problem: Clinical Measurements: Goal: Ability to maintain clinical measurements within normal limits will improve Outcome: Progressing   Problem: Pain Managment: Goal: General experience of comfort will improve and/or be controlled Outcome: Progressing   Problem: Fluid Volume: Goal: Ability to maintain a balanced intake and output will improve Outcome: Progressing   Problem: Nutritional: Goal: Maintenance of adequate nutrition will improve Outcome: Progressing

## 2024-02-24 NOTE — Progress Notes (Signed)
 Pharmacy Electrolyte Replacement  Recent Labs:  Recent Labs    02/24/24 0454 02/24/24 0834  K 4.4 4.2  MG 2.1  --   PHOS 2.6  --   CREATININE 0.86  --     Low Critical Values (K </= 2.5, Phos </= 1, Mg </= 1) Present: None  MD Contacted: Dr. Catherine    Plan:  Kphos neutral 2 tablets q4h x 4 doses  Renal function panel ordered for 1600, will re-evaluate replacement needs

## 2024-02-24 NOTE — Progress Notes (Signed)
 Nutrition Follow-up  DOCUMENTATION CODES:   Severe malnutrition in context of chronic illness  INTERVENTION:  Resume trickle tube feeds via OGT: Osmolite 1.5 at 23ml/hr Once tolerance established and is medically feasible for further advancement recommend: Goal tube feeding regimen: Osmolite 1.5 at 50ml x21 hours (1050ml daily) for nursing to provide Tivicay  60ml ProSource TF20 once daily Provides 1655kcal, 86g protein, free water daily Once tolerating tube feeds at goal and no longer needing OGT for suction, would recommend placement of Cortrak for ongoing nutrition support Continue Renal MVI with minerals daily  NUTRITION DIAGNOSIS:  Severe Malnutrition related to chronic illness as evidenced by severe muscle depletion, severe fat depletion. - remains applicable  GOAL:  Patient will meet greater than or equal to 90% of their needs - goal unmet  MONITOR:  PO intake, Supplement acceptance  REASON FOR ASSESSMENT:  Consult Enteral/tube feeding initiation and management, Assessment of nutrition requirement/status (CRRT)  ASSESSMENT:  Pt with PMH significant for: ESRD-HD, HIV, PAD s/p bilateral BKA, chronic HCV. Of note, recently admiteed 5/28 - 6/06 for MRSA bacteremia with left fourth finger osteomyelitis (amputation 5/29). Presenting to hospital w/ R hip pain and found to have L4-L5 osteomyelitis with abscess.  8/4 - admitted to [facility] 10/11 - rapid response called for hypoglycemia and hypotension, transferred to ICU, intubated 10/12 - pt self extubated and was reintubated within 20 minutes, CRRT being initiated  10/13 - proned d/t concern for ARDS; NPO to LIS d/t distension 10/14 - resume trickles  Patient remains intubated on ventilator support. MV: 7.9 L/min Temp (24hrs), Avg:97.9 F (36.6 C), Min:97.4 F (36.3 C), Max:98.7 F (37.1 C)  Remains on CRRT.  IPAL meeting yesterday with code status changed to DNR.   Discussed patient in rounds and with CCM MD  and RN.  MD reviewed stomach on chest xray comparing yesterday to today's imaging which shows much less gastric distension comparatively.   RN reports that NGT output has significantly declined.  Output yesterday was x12 hours + x12 hours  Once tolerance to tube feeds established and lowered risk for necessity of suctioning, would recommend transition of OGT to Cortrak.   Spoke with Pharmacy. Remains on tivicay . Will attempt to adjust tube feed regimen to infuse over 21 hours.   Admit weight: 40.3 kg   Current weight: 45.3 kg (+deep pitting generalized, BLE edema) EDW 42kg (as of 11/10/2023 HD report)  CRRT : x24 hours I/O's: +4273ml since 9/30   Medications: decadron , colace BID, rena-vit, phosphorus q4h, miralax  daily, thiamine Drips: Levo @ 2mcg/min Propofol  @ 5.65ml/hr   Labs:  Sodium 134 CBG's 124-180 x24 hours  Diet Order:   Diet Order             Diet NPO time specified  Diet effective now                   EDUCATION NEEDS:  Education needs have been addressed  Skin:  Skin Assessment: Skin Integrity Issues: Skin Integrity Issues:: Stage II Stage II: sacrum  Last BM:  10/10  Height:  Ht Readings from Last 1 Encounters:  02/22/24 5' 2 (1.575 m)    Weight:  Wt Readings from Last 1 Encounters:  02/24/24 45.3 kg    Ideal Body Weight:  47.3 kg (adjusted for bilateral BKA (11.8%))  BMI:  Body mass index is 18.27 kg/m.  Estimated Nutritional Needs:   Kcal:  1600-1800 kcal/d  Protein:  80-100g/d  Fluid:  1 L +  UOP  Royce Maris, RDN, LDN Clinical Nutrition See AMiON for contact information.

## 2024-02-24 NOTE — Progress Notes (Signed)
 [facility] Progress Note  Subjective:  Yest I/O were net neg 2.0 L  Today I/O is net neg 1 L so far Levo peaked at 17 mcg overnight and is down to 1 micrograms/min now CXR looks much better, still some residual changes  Vitals:   02/24/24 1530 02/24/24 1545 02/24/24 1547 02/24/24 1600  BP:      Pulse: 78 79  79  Resp: (!) 8 20  20   Temp:   97.8 F (36.6 C)   TempSrc:   Oral   SpO2: 100% 100%  100%  Weight:      Height:        Exam: General: Chronically ill, frail, sedated on vent Heart: RRR Lungs: CTAB Abdomen: soft Extremities: no LE edema, B BKA Dialysis Access: TDC intact    OP HD: [facility] - MWF 3.5h  B400  45.5kg  TDC  Heparin  2000 Mircera 100 mcg - last dose 12/01/23 Calcitriol  1.0 mcg (on hold due to high Ca) Uses midodrine  10mg  TID + extra pre-HD    Assessment/ Plan: Shock: likely septic, on IV abx per CCM 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 Has been on long course of IV Vancomycin . Repeat MRI 10/8 showed the infection has progressed. NSGY did not think he is a surgical candidate.  Is back on IV vanc/ zosyn  now.  Volume overload/ pulm edema: getting vol off w/ CRRT, wts are down 3kg. New dry wt is likely 40-41 kg, today is at 45 kg. Still edematous on exam. Cont UF w/ CRRT.  ESRD: MWF HD, getting HD TTS though this admit.  Acute decompensation causing need for CRRT started 10/12.  BP: Chronic hypotension on mido 10mg  TID  Anemia of ESRD: Hgb 9.9 - continue Aranesp  150mcg q Friday. No IV iron  d/t infection. BMD: CorrCa high, VDRA on hold. Phos on goal, on sevelamer  as binder.  Nutrition: Alb low, continue supps and diet has been liberalized. Debility: PT/OT as tolerated.  Myer Fret MD  [facility] 02/24/2024, 4:15 PM  Recent Labs  Lab 02/23/24 1512 02/24/24 0454 02/24/24 0834  HGB  --  8.5* 8.8*  ALBUMIN  2.0* 1.8*  --   CALCIUM  7.6* 7.6*  --   PHOS 1.5* 2.6  --   CREATININE 1.12 0.86  --   K 3.8 4.4  4.2   No results for input(s): IRON , TIBC, FERRITIN in the last 168 hours. Inpatient medications:  acetaminophen   1,000 mg Per Tube TID   acyclovir ointment  1 Application Topical 6 X Daily   artificial tears   Right Eye QID   Chlorhexidine  Gluconate Cloth  6 each Topical Daily   darbepoetin (ARANESP ) injection - DIALYSIS  150 mcg Subcutaneous Q Fri-1800   dexamethasone  (DECADRON ) injection  20 mg Intravenous Q24H   Followed by   NOREEN ON 02/28/2024] dexamethasone  (DECADRON ) injection  10 mg Intravenous Q24H   docusate  100 mg Per Tube BID   dolutegravir   50 mg Per Tube Daily   emtricitabine -tenofovir  AF  1 tablet Per Tube Daily   feeding supplement (PROSource TF20)  60 mL Per Tube Daily   fentaNYL  (SUBLIMAZE ) injection  25-50 mcg Intravenous Once   gabapentin   100 mg Per Tube Q8H   gatifloxacin   1 drop Right Eye BID   lidocaine   2 patch Transdermal Q24H   methocarbamol   500 mg Per Tube TID   midodrine   10 mg Per Tube TID WC   midodrine   10 mg Per Tube Q T,Th,Sa-HD  multivitamin  1 tablet Per Tube QHS   mouth rinse  15 mL Mouth Rinse Q2H   pantoprazole  (PROTONIX ) IV  40 mg Intravenous Q12H   phosphorus  500 mg Per Tube Q4H   polyethylene glycol  17 g Per Tube Daily   rocuronium   50 mg Intravenous Once   sodium chloride  flush  10-40 mL Intracatheter Q12H   thiamine  100 mg Per Tube Daily    dexmedetomidine (PRECEDEX) IV infusion Stopped (02/23/24 1930)   feeding supplement (OSMOLITE 1.5 CAL) 15 mL/hr at 02/24/24 1600   fentaNYL  infusion INTRAVENOUS 75 mcg/hr (02/24/24 1600)   norepinephrine  (LEVOPHED ) Adult infusion 1 mcg/min (02/24/24 1600)   piperacillin -tazobactam 3.375 g (02/24/24 1503)   prismasol BGK 4/2.5 400 mL/hr at 02/24/24 0402   prismasol BGK 4/2.5 1,500 mL/hr at 02/24/24 1256   prismasol BGK 4/2.5 400 mL/hr at 02/24/24 0401   propofol  (DIPRIVAN ) infusion 20 mcg/kg/min (02/24/24 1600)   vancomycin  100 mL/hr at 02/24/24 1600   dextrose , fentaNYL , heparin ,  hydrALAZINE , ibuprofen , ipratropium-albuterol , lip balm, metoprolol  tartrate, midazolam , ondansetron  (ZOFRAN ) IV, mouth rinse, oxyCODONE , sodium chloride , sodium chloride  flush

## 2024-02-24 NOTE — Plan of Care (Signed)
  Problem: Clinical Measurements: Goal: Respiratory complications will improve Outcome: Progressing   Problem: Elimination: Goal: Will not experience complications related to bowel motility Outcome: Progressing   Problem: Pain Managment: Goal: General experience of comfort will improve and/or be controlled Outcome: Progressing   Problem: Safety: Goal: Ability to remain free from injury will improve Outcome: Progressing   Problem: Skin Integrity: Goal: Risk for impaired skin integrity will decrease Outcome: Progressing   Problem: Nutritional: Goal: Progress toward achieving an optimal weight will improve Outcome: Progressing   Problem: Metabolic: Goal: Ability to maintain appropriate glucose levels will improve Outcome: Progressing

## 2024-02-24 NOTE — Progress Notes (Signed)
 Per CCM ETT retracted 3cm from 26cm to 23cm at the lips.

## 2024-02-24 NOTE — Progress Notes (Addendum)
 NAMEEvart Daniels, MRN:  [id number], DOB:  Jun 15, 1970, LOS: 71 ADMISSION DATE:  12/14/2023, CONSULTATION DATE:  02/24/2024 REFERRING MD:  MELODIE, CHIEF COMPLAINT:  septic shock, respiratory failure   History of Present Illness:   Alvin Daniels is a 53 y.o. male with medical history significant of ESRD on HD MWF, type 2 diabetes mellitus poorly controlled, HIV, chronic hepatitis C, PAD s/p bilateral BKA, and recent admission for MRSA septicemia, possible IE, and L middle finger OM s/p amputation per Ortho surgery on 5/29 (cultures grew E. 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   Pt was in USOH until this past week when he had difficulty walking due to the pain in his R hip. The pain would bother him all day, but he was able to walk most of the day and rest in the evening. Yesterday, the pain was so bad that he was unable to walk at all, so he presented to the ED for further evaluation. Of note, pt was recently admitted for MRSA septicemia and possible IE and was continued on IV vancomycin  w/ HD until 7/19, and d/c recommended repeat TEE to eval for clearance of IE.   In the ED, pt AFVSS. Labs showed K 3.4, Cr 9.06, CRP 15.2, and ESR 133. MRI sacrum showed discitis and osteomyelitis at L4-5 with associated paraspinal inflammatory changes and right psoas Abscess. EDP ordered IV vancomycin /cefepime , and requested medicine admission.    Patient was brought into ICU because of increasing vasopressor needs.  He was eventually intubated.  Pertinent  Medical History  ESRD DM HIV Chronic Hep C PAD s/p bilateral BKA  Significant Hospital Events: Including procedures, antibiotic start and stop dates in addition to other pertinent events   02/21/2024 intubated for respiratory failure and vasopressor therapy was increasing during the day because of septic shock.  02/23/2024: proned at 10:30 and supinated  at 3 AM  Interim History / Subjective:  Vasopressors: Levo 2 mcg Sedation: Propofol  40, Fentanyl  150 MV: PRVC 380/+10/40%  Objective    Blood pressure 117/60, pulse 73, temperature 97.7 F (36.5 C), temperature source Axillary, resp. rate 20, height 5' 2 (1.575 m), weight 45.3 kg, SpO2 100%.    Vent Mode: PRVC FiO2 (%):  [50 %-75 %] 50 % Set Rate:  [20 bmp] 20 bmp Vt Set:  [380 mL] 380 mL PEEP:  [12 cmH20] 12 cmH20 Plateau Pressure:  [21 cmH20] 21 cmH20   Intake/Output Summary (Last 24 hours) at 02/24/2024 0826 Last data filed at 02/24/2024 0800 Gross per 24 hour  Intake 2982.89 ml  Output 5192.3 ml  Net -2209.41 ml   Filed Weights   02/21/24 0859 02/23/24 0306 02/24/24 0500  Weight: 46.3 kg 48.9 kg 45.3 kg    Examination: General: cachectic, chronically ill appearing, deformity in R head area HENT: pupils pinpoint, sluggish Lungs: Crackles diffusely in all lobes Cardiovascular: tachycardic, no murmurs Abdomen: soft, difficult to assess tenderness, ecchymoses noted in lower abdomen. Extremities: 1+ sacral edema, bilateral BKAs Neuro: obtunded, not response to commands, no response to noxious stimulation.  Resolved problem list   Assessment and Plan   #Septic Shock: likely septic in the setting of psoas abscess and discitis - Continue Levophed  and wean as tolerated - Continue Midodrine  10 mg TID - Agree with neurosurgery consult for source control - Agree with ID consult for antimicrobial plan. - Target MAP > 65 mmHg  #Acute Hypoxic Respiratory Failure:  concern for ARDS now s/p 1 session of proning with improved ventilator requirements. - Prevent lung injury - Low tidal volume ventilation - Aim for Pplat < 30 and DP < 15 - Lowest FiO2 to keep SPO2 > 90% and PaO2 > 65 mmHg - Permissive hypercapnia - pH goal > 7.2 - Daily SAT/SBT - s/p proning on 10/13, repeat ABG on peep 10/40%. - DEXA ARDS steroids - Aggressive volume removal with CRRT - CXR  #MRSA  Discitis/Psoas Abscess: - Continue Vancomycin , no end date for now given that there's no surgical intervention at this time.  # Abundant GNR growing in trach aspirate: - Continue Zosyn  (end date: 02/27/2024)  #Stress Ulcer PPx: PPI  #Nutrition: NPO, place tube on LIS due to gastric distention.  #ESRD: - Continue CRRT - Renally adjust medications - Avoid Nephrotoxins - Strict I/Os - Foley catheter  #VTE ppx: holding heparin  subQ due to thrombocytopenia  #Thrombocytopenia: likely DIC and potentially some contribution from CRRT. - Continue to monitor PLT, goal > 10 K - Transfuse PLT to meet goal  #Hypoglycemia: - Continue D20 at 30 cc/hr  #PADs: - Propofo; - Fentanyl   #GOC: Limited code DNR per discussion with brother (see IPAL note).  Disposition: ICU appropriate for respiratory failure and shock state.  Labs   CBC: Recent Labs  Lab 02/19/24 0852 02/21/24 0422 02/21/24 1942 02/22/24 1649 02/23/24 0120 02/23/24 0905 02/23/24 0940 02/23/24 1243 02/24/24 0454  WBC 6.8 9.3  --  7.0 4.0  --   --   --  6.3  NEUTROABS 5.6  --   --  6.2  --   --   --   --   --   HGB 9.9* 8.0*   < > 6.7* 8.7* 9.5*  --  10.2* 8.5*  HCT 30.0* 24.3*   < > 20.2* 25.6* 28.0*  --  30.0* 24.4*  MCV 98.4 98.0  --  98.1 93.1  --   --   --  92.4  PLT 82* 51*  --  33* 26*  --  20*  --  12*   < > = values in this interval not displayed.    Basic Metabolic Panel: Recent Labs  Lab 02/21/24 0422 02/21/24 1942 02/22/24 1600 02/23/24 0120 02/23/24 0905 02/23/24 1243 02/23/24 1512 02/24/24 0454  NA 127*   < > 125* 127* 132* 132* 127* 131*  K 3.6   < > 3.5 3.4* 3.6 3.7 3.8 4.4  CL 91*  --  94* 97*  --   --  95* 98  CO2 24  --  20* 21*  --   --  22 20*  GLUCOSE 115*  --  103* 104*  --   --  136* 183*  BUN 35*  --  35* 22*  --   --  13 8  CREATININE 2.51*  --  2.54* 1.70*  --   --  1.12 0.86  CALCIUM  8.0*  --  7.6* 7.7*  --   --  7.6* 7.6*  MG  --   --   --  1.8  --   --   --  2.1  PHOS  3.2  --  2.5 1.8*  --   --  1.5* 2.6   < > = values in this interval not displayed.   GFR: Estimated Creatinine Clearance: 63.6 mL/min (by C-G formula based on SCr of 0.86 mg/dL). Recent Labs  Lab 02/21/24 0422 02/21/24 0813 02/22/24 1649 02/23/24 0120 02/24/24 0454  WBC 9.3  --  7.0 4.0 6.3  LATICACIDVEN  --  1.3  --   --   --     Liver Function Tests: Recent Labs  Lab 02/21/24 0422 02/22/24 1600 02/23/24 0120 02/23/24 1512 02/24/24 0454  ALBUMIN  2.0* 2.1* 2.1* 2.0* 1.8*   No results for input(s): LIPASE, AMYLASE in the last 168 hours. No results for input(s): AMMONIA in the last 168 hours.  ABG    Component Value Date/Time   PHART 7.341 (L) 02/23/2024 1243   PCO2ART 43.9 02/23/2024 1243   PO2ART 113 (H) 02/23/2024 1243   HCO3 23.7 02/23/2024 1243   TCO2 25 02/23/2024 1243   ACIDBASEDEF 2.0 02/23/2024 1243   O2SAT 98 02/23/2024 1243     Coagulation Profile: Recent Labs  Lab 02/23/24 0940  INR 1.9*    Cardiac Enzymes: Recent Labs  Lab 02/18/24 1835  CKTOTAL 35*    HbA1C: Hemoglobin A1C  Date/Time Value Ref Range Status  07/17/2023 04:28 PM 10.0 (A) 4.0 - 5.6 % Final  01/29/2023 03:22 PM 8.5 (A) 4.0 - 5.6 % Final   HbA1c, POC (controlled diabetic range)  Date/Time Value Ref Range Status  09/28/2018 02:21 PM 6.8 0.0 - 7.0 % Final   Hgb A1c MFr Bld  Date/Time Value Ref Range Status  01/28/2024 04:00 AM 5.9 (H) 4.8 - 5.6 % Final    Comment:    (NOTE) Diagnosis of Diabetes The following HbA1c ranges recommended by the American Diabetes Association (ADA) may be used as an aid in the diagnosis of diabetes mellitus.  Hemoglobin             Suggested A1C NGSP%              Diagnosis  <5.7                   Non Diabetic  5.7-6.4                Pre-Diabetic  >6.4                   Diabetic  <7.0                   Glycemic control for                       adults with diabetes.    08/01/2022 11:53 PM 10.7 (H) 4.8 - 5.6 % Final     Comment:    (NOTE)         Prediabetes: 5.7 - 6.4         Diabetes: >6.4         Glycemic control for adults with diabetes: <7.0     CBG: Recent Labs  Lab 02/23/24 1505 02/23/24 1950 02/23/24 2320 02/24/24 0402 02/24/24 0739  GLUCAP 124* 143* 166* 165* 166*    Review of Systems:   Not obtained  Past Medical History:  He,  has a past medical history of Acute bilateral low back pain (01/03/2021), AIDS ([name]) (11/22/2014), Back pain (06/09/2019), BKA stump complication ([name]) (06/09/2019), Chronic diarrhea, Chronic hepatitis C without hepatic coma ([name]) (11/22/2014), 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[name]), Corneal ulcer (12/18/2022), 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   Surgical History:   Past Surgical History:  Procedure Laterality Date   AMPUTATION Left 04/20/2014   Procedure: 3rd toe amputation, 4th Toe Amputation,  5th Toe Amputation;  Surgeon: Jerona Harden GAILS, MD;  Location: [facility] OR;  Service: Orthopedics;  Laterality: Left;   AMPUTATION Left 05/02/2014   Procedure: Midfoot Amputation;  Surgeon: Jerona Harden GAILS, MD;  Location: [facility] OR;  Service: Orthopedics;  Laterality: Left;   AMPUTATION Left 06/17/2014   Procedure: AMPUTATION BELOW KNEE;  Surgeon: Jerona Harden GAILS, MD;  Location: [facility] OR;  Service: Orthopedics;  Laterality: Left;   AMPUTATION Right 09/04/2018   Procedure: RIGHT BELOW KNEE AMPUTATION;  Surgeon: Harden Jerona GAILS, MD;  Location: [facility] OR;  Service: Orthopedics;  Laterality: Right;  RIGHT BELOW KNEE AMPUTATION   AMPUTATION FINGER Left  10/09/2023   Procedure: AMPUTATION, FINGER;  Surgeon: Romona Harari, MD;  Location: [facility] OR;  Service: Orthopedics;  Laterality: Left;  Left Ring Finger amputation   AV FISTULA PLACEMENT Left    AV FISTULA PLACEMENT Left 05/10/2016   Procedure: Creation Left Arm Brachiocephalic Arteriovenous Fistula and Ligation of Radiocephalic Fistula;  Surgeon: Lonni GORMAN Blade, MD;  Location: [facility] OR;  Service: Vascular;  Laterality: Left;   DIALYSIS/PERMA CATHETER INSERTION N/A 09/01/2023   Procedure: DIALYSIS/PERMA CATHETER INSERTION;  Surgeon: Melia Lynwood ORN, MD;  Location: [facility] INVASIVE CV LAB;  Service: Cardiovascular;  Laterality: N/A;   FEMUR IM NAIL Left 08/17/2016   Procedure: INTRAMEDULLARY (IM) RETROGRADE FEMORAL NAILING;  Surgeon: Oneil JAYSON Herald, MD;  Location: [facility] OR;  Service: Orthopedics;  Laterality: Left;   FOOT AMPUTATION THROUGH ANKLE Left 12/'21/2015   midfoot   IR AV DIALY SHUNT INTRO NEEDLE/INTRACATH INITIAL W/PTA/IMG LEFT  04/17/2023   IR FLUORO GUIDE CV LINE RIGHT  10/14/2023   IR FLUORO GUIDE CV LINE RIGHT  11/11/2023   IR GENERIC HISTORICAL Left 05/01/2016   IR THROMBECTOMY AV FISTULA W/THROMBOLYSIS/PTA INC/SHUNT/IMG LEFT 05/01/2016 Toribio Faes, MD [facility]   IR GENERIC HISTORICAL  05/01/2016   IR US  GUIDE VASC ACCESS LEFT 05/01/2016 Toribio Faes, MD [facility]   IR GENERIC HISTORICAL  05/07/2016   IR FLUORO GUIDE CV LINE RIGHT 05/07/2016 Ami Bellman, DO [facility]   IR GENERIC HISTORICAL  05/07/2016   IR US  GUIDE [facility] ACCESS RIGHT 05/07/2016 Ami Bellman, DO [facility]   IR GENERIC HISTORICAL  05/22/2016   IR US  GUIDE VASC ACCESS RIGHT 05/22/2016 Ozell Specking, MD [facility]   IR GENERIC HISTORICAL  05/22/2016   IR FLUORO GUIDE CV LINE RIGHT 05/22/2016 Ozell Specking, MD [facility]   IR LUMBAR DISC ASPIRATION W/IMG GUIDE  01/07/2024   IR REMOVAL TUN CV CATH W/O FL  08/21/2016   IR REMOVAL TUN CV CATH W/O FL  10/12/2023   IR US  GUIDE VASC ACCESS LEFT  04/17/2023   IR  US  GUIDE VASC ACCESS RIGHT  10/14/2023   PERIPHERAL VASCULAR CATHETERIZATION Left 05/09/2016   Procedure: A/V Fistulagram;  Surgeon: Lonni GORMAN Blade, MD;  Location: [facility] INVASIVE CV LAB;  Service: Cardiovascular;  Laterality: Left;  arm   TEE WITHOUT CARDIOVERSION N/A 06/22/2018   Procedure: TRANSESOPHAGEAL ECHOCARDIOGRAM (TEE);  Surgeon: Jeffrie Oneil JAYSON, MD;  Location: [facility] ENDOSCOPY;  Service: Cardiovascular;  Laterality: N/A;   TRANSESOPHAGEAL ECHOCARDIOGRAM (CATH LAB) N/A 10/15/2023   Procedure: TRANSESOPHAGEAL ECHOCARDIOGRAM;  Surgeon: Rolan Ezra GORMAN, MD;  Location: [facility] INVASIVE CV LAB;  Service: Cardiovascular;  Laterality: N/A;     Social History:   reports that he  has never smoked. He has never used smokeless tobacco. He reports that he does not drink alcohol  and does not use drugs.   Family History:  His family history includes Diabetes in his father and mother.   Allergies No Known Allergies   Home Medications  Prior to Admission medications   Medication Sig Start Date End Date Taking? Authorizing Provider  bictegravir-emtricitabine -tenofovir  AF (BIKTARVY ) 50-200-25 MG TABS tablet Take 1 tablet by mouth daily. 04/16/23  Yes Fleeta Rothman, Jomarie SAILOR, MD  insulin  aspart protamine  - aspart (NOVOLOG  70/30 FLEXPEN) (70-30) 100 UNIT/ML FlexPen Inject 20 Units into the skin 2 (two) times daily. Patient taking differently: Inject 10 Units into the skin 2 (two) times daily. 07/17/23  Yes Tanda Bleacher, MD  ZYPITAMAG  4 MG TABS Take by mouth. 11/26/23  Yes [provider]  gabapentin  (NEURONTIN ) 100 MG capsule Take 1 capsule (100 mg total) by mouth at bedtime. Patient not taking: Reported on 12/16/2023 10/17/23 01/15/24  [place], Camellia PARAS, DO  HYDROcodone -acetaminophen  (NORCO/VICODIN) 5-325 MG tablet Take 1 tablet by mouth every 6 (six) hours as needed for severe pain (pain score 7-10) or moderate pain (pain score 4-6). Patient not taking: Reported on 12/15/2023 10/17/23   [place], Camellia PARAS, DO   midodrine  (PROAMATINE ) 10 MG tablet Take 1 tablet (10 mg total) by mouth 3 (three) times daily as needed for low blood pressure, SBP</=100. may take 30 min prior to dialysis and mid treatment as needed. Patient not taking: Reported on 12/16/2023 09/05/23     bictegravir-emtricitabine -tenofovir  AF (BIKTARVY ) 50-200-25 MG TABS tablet Take 1 tablet by mouth daily. 01/02/22   Fleeta Rothman Jomarie SAILOR, MD     Due to a high probability of clinically significant, life threatening deterioration, the patient required my highest level of preparedness to intervene emergently and I personally spent this critical care time directly and personally managing the patient. This critical care time included obtaining a history; examining the patient; pulse oximetry; ordering and review of studies; arranging urgent treatment with development of a management plan; evaluation of patient's response to treatment; frequent reassessment; and, discussions with other providers.  This critical care time was performed to assess and manage the high probability of imminent, life-threatening deterioration that could result in multi-organ failure. It was exclusive of separately billable procedures and treating other patients and teaching time. Critical Care Time: 35 minutes.  Paula Southerly, MD [facility]

## 2024-02-24 NOTE — TOC Progression Note (Signed)
 Transition of Care [facility]) - Progression Note    Patient Details  Name: Alvin Daniels MRN: [id number] Date of Birth: 1970-10-23  Transition of Care [facility]) CM/SW Contact  Tom-Johnson, Harvest Muskrat, RN Phone Number: 02/24/2024, 12:09 PM  Clinical Narrative:     Patient transferred to ICU for Septic Shock and Respiratory Failure. Currently intubated and sedated. Nephrology, ID following. On IV abx, CRRT.  Patient not Medically ready for discharge.  CM will continue to follow as patient progresses with care towards discharge.            Expected Discharge Plan: Home/Self Care Barriers to Discharge: Continued Medical Work up, Inadequate or no insurance (does not have adequate home support in place)               Expected Discharge Plan and Services In-house Referral: Clinical Social Work     Living arrangements for the past 2 months: Single Family Home Expected Discharge Date: 02/02/24                                     Social Drivers of Health (SDOH) Interventions SDOH Screenings   Food Insecurity: No Food Insecurity (12/15/2023)  Housing: Low Risk  (12/15/2023)  Transportation Needs: No Transportation Needs (12/15/2023)  Utilities: Not At Risk (12/15/2023)  Alcohol  Screen: Low Risk  (07/17/2023)  Depression (PHQ2-9): Low Risk  (07/17/2023)  Financial Resource Strain: Low Risk  (01/29/2023)  Physical Activity: Inactive (01/29/2023)  Social Connections: Moderately Isolated (10/08/2023)  Stress: No Stress Concern Present (01/29/2023)  Tobacco Use: Low Risk  (02/19/2024)  Health Literacy: Adequate Health Literacy (01/29/2023)    Readmission Risk Interventions     No data to display

## 2024-02-24 NOTE — Progress Notes (Signed)
 Daily Progress Note   Patient Name: Alvin Daniels       Date: 02/24/2024 DOB: [birth year]  Age: 53 y.o. MRN#: [id number] Attending Physician: Catherine Cools, MD Primary Care Physician: Pcp, No Admit Date: 12/14/2023  Reason for Consultation/Follow-up: Establishing goals of care  Subjective: Medical records reviewed including progress notes, labs, imaging. Patient assessed at the bedside.  He is intubated, sedated, on CRRT and pressors, appears critically ill.  Discussed with RN.  He has a visitor present (family friend) who has known him since she was 61.  She is unsure when his brother will be visiting.  I called patient's brother [name] introduced role of palliative care team and providing extra layer of support as patient family's with serious illness are faced with complex medical decision making.  He confirms that a conversation was held with primary team regarding goals of care, though he still needs to speak with his brother about this in the next steps.  He is going to do this later tonight after work.  He is not able to talk with me in much detail as he is at work and needs to get back to his duties.  I provided him with PMT contact information and a text message and requested any availability he might have for further discussions tomorrow.  He is Adult nurse.  Questions and concerns addressed. PMT will continue to support holistically.   Length of Stay: 71   Physical Exam Vitals and nursing note reviewed.  Constitutional:      Appearance: He is cachectic. He is ill-appearing.     Interventions: He is intubated.  HENT:     Head: Normocephalic and atraumatic.  Cardiovascular:     Rate and Rhythm: Normal rate.  Pulmonary:     Effort: He is intubated.  Skin:    General: Skin is dry.  Neurological:     Mental  Status: He is unresponsive.     Comments: Sedated             Vital Signs: BP 117/60   Pulse 80   Temp 98.7 F (37.1 C) (Oral)   Resp 20   Ht 5' 2 (1.575 m)   Wt 45.3 kg   SpO2 100%   BMI 18.27 kg/m  SpO2: SpO2: 100 % O2 Device: O2 Device: Ventilator O2 Flow Rate: O2 Flow Rate (L/min): (S) 15 L/min      Palliative Assessment/Data: 10%   Palliative Care Assessment & Plan   Patient Profile: Palliative Care consult requested for goals of care discussion in this 53 y.o. 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  He was admitted on 12/14/2023 from home with inability to walk due to uncontrolled pain.  During workup MRI of sacrum showed discitis and osteomyelitis of L4-5 with associated paraspinal inflammatory changes and right psoas mass.  Patient started on IV vancomycin  and cefepime .   Assessment: Goals of care conversation End-stage renal disease on CRRT Acute hypoxic respiratory failure with concern for ARDS, improving after proning Septic shock MRSA psoas abscess and discitis PAD s/p bilateral BKA's  Recommendations/Plan: Continue DNR Continue current care plan.  Patient's brother will continue discussions this evening regarding possible transition to comfort focused care Ongoing goals of care discussions PMT will continue to follow and support  Prognosis: Very poor  Discharge Planning: Anticipated Hospital Death  Care plan was discussed with patient's brother, MD, RN          Langston Summerfield SHAUNNA Fell, PA-C  Palliative Medicine Team Team phone # [phone]  Thank you for allowing the Palliative Medicine Team to assist in the care of this patient. Please utilize secure chat with additional questions,  if there is no response within 30 minutes please call the above phone number.  Palliative Medicine Team providers are available by phone from 7am to 7pm daily and can be reached through the team cell phone.  Should this patient require assistance outside of these hours, please call the patient's attending physician.   This  Time Total: 50  Visit consisted of counseling and education dealing with the complex and emotionally intense issues of symptom management and palliative care in the setting of serious and potentially life-threatening illness. Greater than 50% of this time was spent counseling and coordinating care related to the above assessment and plan.  Personally spent 50 minutes in patient care including extensive chart review (labs, imaging, progress/consult notes, vital signs), medically appropraite exam, discussed with treatment team, education to patient, family, and staff, documenting clinical information, medication review and management, coordination of care, and available advanced directive documents.

## 2024-02-25 ENCOUNTER — Inpatient Hospital Stay (HOSPITAL_COMMUNITY): Payer: MEDICAID

## 2024-02-25 DIAGNOSIS — Z8614 Personal history of Methicillin resistant Staphylococcus aureus infection: Secondary | ICD-10-CM

## 2024-02-25 LAB — RENAL FUNCTION PANEL
Albumin: 1.9 g/dL — ABNORMAL LOW (ref 3.5–5.0)
Albumin: 1.9 g/dL — ABNORMAL LOW (ref 3.5–5.0)
Anion gap: 9 (ref 5–15)
Anion gap: 12 (ref 5–15)
BUN: 12 mg/dL (ref 6–20)
BUN: 17 mg/dL (ref 6–20)
CO2: 23 mmol/L (ref 22–32)
CO2: 23 mmol/L (ref 22–32)
Calcium: 8 mg/dL — ABNORMAL LOW (ref 8.9–10.3)
Calcium: 8.1 mg/dL — ABNORMAL LOW (ref 8.9–10.3)
Chloride: 100 mmol/L (ref 98–111)
Chloride: 97 mmol/L — ABNORMAL LOW (ref 98–111)
Creatinine, Ser: 0.63 mg/dL (ref 0.61–1.24)
Creatinine, Ser: 0.54 mg/dL — ABNORMAL LOW (ref 0.61–1.24)
GFR, Estimated: >60 mL/min (ref >60)
GFR, Estimated: >60 mL/min (ref >60)
Glucose, Bld: 160 mg/dL — ABNORMAL HIGH (ref 70–99)
Glucose, Bld: 158 mg/dL — ABNORMAL HIGH (ref 70–99)
Phosphorus: 2.1 mg/dL — ABNORMAL LOW (ref 2.5–4.6)
Phosphorus: 2.8 mg/dL (ref 2.5–4.6)
Potassium: 4 mmol/L (ref 3.5–5.1)
Potassium: 4.2 mmol/L (ref 3.5–5.1)
Sodium: 132 mmol/L — ABNORMAL LOW (ref 135–145)
Sodium: 132 mmol/L — ABNORMAL LOW (ref 135–145)

## 2024-02-25 LAB — CBC
HCT: 23.5 % — ABNORMAL LOW (ref 39.0–52.0)
HCT: 22.3 % — ABNORMAL LOW (ref 39.0–52.0)
Hemoglobin: 7.8 g/dL — ABNORMAL LOW (ref 13.0–17.0)
Hemoglobin: 7.5 g/dL — ABNORMAL LOW (ref 13.0–17.0)
MCH: 31.5 pg (ref 26.0–34.0)
MCH: 31.6 pg (ref 26.0–34.0)
MCHC: 33.2 g/dL (ref 30.0–36.0)
MCHC: 33.6 g/dL (ref 30.0–36.0)
MCV: 94.8 fL (ref 80.0–100.0)
MCV: 94.1 fL (ref 80.0–100.0)
Platelets: 32 K/uL — ABNORMAL LOW (ref 150–400)
Platelets: 43 K/uL — ABNORMAL LOW (ref 150–400)
RBC: 2.48 MIL/uL — ABNORMAL LOW (ref 4.22–5.81)
RBC: 2.37 MIL/uL — ABNORMAL LOW (ref 4.22–5.81)
RDW: 20.2 % — ABNORMAL HIGH (ref 11.5–15.5)
RDW: 20.3 % — ABNORMAL HIGH (ref 11.5–15.5)
WBC: 6.2 K/uL (ref 4.0–10.5)
WBC: 6.9 K/uL (ref 4.0–10.5)
nRBC: 0 % (ref 0.0–0.2)
nRBC: 0.4 % — ABNORMAL HIGH (ref 0.0–0.2)

## 2024-02-25 LAB — GLUCOSE, CAPILLARY
Glucose-Capillary: 158 mg/dL — ABNORMAL HIGH (ref 70–99)
Glucose-Capillary: 135 mg/dL — ABNORMAL HIGH (ref 70–99)
Glucose-Capillary: 143 mg/dL — ABNORMAL HIGH (ref 70–99)
Glucose-Capillary: 138 mg/dL — ABNORMAL HIGH (ref 70–99)
Glucose-Capillary: 155 mg/dL — ABNORMAL HIGH (ref 70–99)
Glucose-Capillary: 127 mg/dL — ABNORMAL HIGH (ref 70–99)

## 2024-02-25 LAB — PREPARE PLATELET PHERESIS
Unit division: 0
Unit division: 0

## 2024-02-25 LAB — BPAM PLATELET PHERESIS
Blood Product Expiration Date: 202510152359
Blood Product Expiration Date: 202510152359
ISSUE DATE / TIME: 202510140911
ISSUE DATE / TIME: 202510141117
Unit Type and Rh: 5100
Unit Type and Rh: 6200

## 2024-02-25 LAB — MAGNESIUM: Magnesium: 2.2 mg/dL (ref 1.7–2.4)

## 2024-02-25 LAB — VANCOMYCIN, TROUGH: Vancomycin Tr: 14 ug/mL — ABNORMAL LOW (ref 15–20)

## 2024-02-25 MED ORDER — K PHOS MONO-SOD PHOS DI & MONO 155-852-130 MG PO TABS
500.0000 mg | ORAL_TABLET | ORAL | Status: AC
  Administered 2024-02-25 – 2024-02-26 (×4): 500 mg
  Filled 2024-02-25 (×4): qty 2

## 2024-02-25 MED ORDER — NOREPINEPHRINE 16 MG/250ML-% IV SOLN
0.0000 ug/min | INTRAVENOUS | Status: AC
  Administered 2024-02-25: 2 ug/min via INTRAVENOUS
  Administered 2024-02-28: 3 ug/min via INTRAVENOUS
  Filled 2024-02-25: qty 250

## 2024-02-25 MED ORDER — VANCOMYCIN HCL 500 MG/100ML IV SOLN
500.0000 mg | Freq: Once | INTRAVENOUS | Status: AC
  Administered 2024-02-25: 500 mg via INTRAVENOUS
  Filled 2024-02-25: qty 100

## 2024-02-25 MED ORDER — SODIUM CHLORIDE 0.9% IV SOLUTION: Freq: Once | INTRAVENOUS | Status: AC

## 2024-02-25 MED ORDER — SODIUM CHLORIDE 0.9 % IV SOLN
15.0000 mmol | Freq: Once | INTRAVENOUS | Status: AC
  Administered 2024-02-25: 15 mmol via INTRAVENOUS
  Filled 2024-02-25: qty 5

## 2024-02-25 NOTE — Progress Notes (Signed)
 eLink Physician-Brief Progress Note Patient Name: Alvin Daniels DOB: 1970/11/13 MRN: [id number]   Date of Service  02/25/2024  HPI/Events of Note  Patient with soft blood pressures on dialysis.  eICU Interventions  Levophed  gtt ordered.        Chrysta Fulcher U Victor Granados 02/25/2024, 10:00 PM

## 2024-02-25 NOTE — Progress Notes (Signed)
 2100 - Patients brother [name] came to bedside, requesting to speak with primary provider about brothers status. Advised primary provider not available at this time but he could speak with [facility] provider or ground team provider. Brother states he wants to speak with the primary provider and will return in the morning around 0900 with additional family members to discuss patients status and possibility of moving towards comfort measures. Brother requesting to have Engineer, structural present for meeting.

## 2024-02-25 NOTE — Plan of Care (Signed)
  Problem: Clinical Measurements: Goal: Ability to maintain clinical measurements within normal limits will improve Outcome: Progressing Goal: Will remain free from infection Outcome: Progressing Goal: Diagnostic test results will improve Outcome: Progressing Goal: Respiratory complications will improve Outcome: Progressing Goal: Cardiovascular complication will be avoided Outcome: Progressing   Problem: Activity: Goal: Risk for activity intolerance will decrease Outcome: Progressing   Problem: Nutrition: Goal: Adequate nutrition will be maintained Outcome: Progressing   Problem: Coping: Goal: Level of anxiety will decrease Outcome: Progressing   Problem: Elimination: Goal: Will not experience complications related to bowel motility Outcome: Progressing Goal: Will not experience complications related to urinary retention Outcome: Progressing   Problem: Pain Managment: Goal: General experience of comfort will improve and/or be controlled Outcome: Progressing   Problem: Safety: Goal: Ability to remain free from injury will improve Outcome: Progressing   Problem: Skin Integrity: Goal: Risk for impaired skin integrity will decrease Outcome: Progressing   Problem: Fluid Volume: Goal: Ability to maintain a balanced intake and output will improve Outcome: Progressing   Problem: Health Behavior/Discharge Planning: Goal: Ability to identify and utilize available resources and services will improve Outcome: Progressing Goal: Ability to manage health-related needs will improve Outcome: Progressing   Problem: Metabolic: Goal: Ability to maintain appropriate glucose levels will improve Outcome: Progressing   Problem: Nutritional: Goal: Maintenance of adequate nutrition will improve Outcome: Progressing Goal: Progress toward achieving an optimal weight will improve Outcome: Progressing   Problem: Skin Integrity: Goal: Risk for impaired skin integrity will  decrease Outcome: Progressing   Problem: Tissue Perfusion: Goal: Adequacy of tissue perfusion will improve Outcome: Progressing   Problem: Safety: Goal: Non-violent Restraint(s) Outcome: Progressing

## 2024-02-25 NOTE — Progress Notes (Signed)
 eLink Physician-Brief Progress Note Patient Name: Alvin Daniels DOB: 1970-11-28 MRN: [id number]   Date of Service  02/25/2024  HPI/Events of Note  Hemoglobin down to 7.8 from 8.2, bedside RN reported some blood in the stool, sign out communication was to transfuse for platelet count < 50 K.  eICU Interventions  Given above, one unit of platelets ordered transfused.        Osha Rane U Norine Reddington 02/25/2024, 3:50 AM

## 2024-02-25 NOTE — Progress Notes (Signed)
 Pharmacy Antibiotic Note  Langford Law is a 53 y.o. male admitted on 12/14/2023 with lumbar osteomyelitis, psoas abscess, paraspinal abscess and epidural phlegmon.  Repeat MRI shows new abscess and vancomycin  was switched to Cubicin.  Now with possible septic shock, so Pharmacy has been consulted for vancomycin  and Zosyn  dosing.  Patient has ESRD on TTS HD, transitioning to CRRT given pressor needs.  10/15 PM update: Vanco trough 14 mcg/mL drawn ~23.5 hours post dose Plan: Given onetime additional 500 mg Vanco dose Continue vanc 500mg  IV Q24H Vanco level 10/16  Zosyn  3.375gm IV Q6H, 30 min infusion Monitor CRRT tolerance, micro data, clinical progress and vanc levels as indicated  Height: 5' 2 (157.5 cm) Weight: 44.1 kg (97 lb 3.6 oz) IBW/kg (Calculated) : 54.6  Temp (24hrs), Avg:98.1 F (36.7 C), Min:97.7 F (36.5 C), Max:98.5 F (36.9 C)  Recent Labs  Lab 02/21/24 0813 02/22/24 1600 02/23/24 0120 02/23/24 1512 02/24/24 0454 02/24/24 1600 02/24/24 1816 02/25/24 0300 02/25/24 0842 02/25/24 1423  WBC  --    < > 4.0  --  6.3  --  6.0 6.2 6.9  --   CREATININE  --    < > 1.70* 1.12 0.86 0.69  --  0.63  --   --   LATICACIDVEN 1.3  --   --   --   --   --   --   --   --   --   VANCOTROUGH  --   --   --   --   --   --   --   --   --  14*   < > = values in this interval not displayed.    Estimated Creatinine Clearance: 66.6 mL/min (by C-G formula based on SCr of 0.63 mg/dL).    No Known Allergies  Vanc 8/5 >> 10/8, 10/11 >> Zosyn  10/11 >>  Dapto 10/8 >> 10/11 Cefepime  8/5 >> 8/28 Cipro  8/14 >> 8/15 Zymaxid  to R eye BID and lubricant QID per Ophth 8/20 >>   10/8 CK = 35    8/4 blood: negative 8/27 surgical/needle aspirate: few MRSA 8/27 disc biopsy by IR +MRSA 10/11 BCx -  10/11 MRSA PCR - negative  Igor Bishop BS, PharmD, BCPS Clinical Pharmacist 02/25/2024 4:51 PM  Contact: [phone] after 3 PM

## 2024-02-25 NOTE — Progress Notes (Signed)
 Daily Progress Note   Patient Name: Alvin Daniels       Date: 02/25/2024 DOB: 11/14/1970  Age: 53 y.o. MRN#: [id number] Attending Physician: Catherine Cools, MD Primary Care Physician: Pcp, No Admit Date: 12/14/2023  Reason for Consultation/Follow-up: Establishing goals of care  Subjective: Medical records reviewed including progress notes, labs, imaging. Patient assessed at the bedside. He is moving his extremities, does not follow commands. Discussed with RN. No visitors have been seen yet this morning.  Called patient's brother [name] for ongoing GOC discussions and palliative support. He shared that he was driving to the hospital to speak with the care team and expected to arrive in about 15 minutes. I offered Spanish interpreter and he was agreeable, also would be fine without an interpreter. Called office to arrange for assistance and Jinnie will be present for family meeting. Discussed with primary attending.  I then returned to the bedside for family meeting alongside primary MD, RN and interpeter Jinnie. Re-introduced role of PMT in providing an extra layer of support.   Patient's brother shared his plan to hold a conference call with his siblings tomorrow to discuss the decision together. Emotional support and therapeutic listening was provided as he reflected on his experience navigating this type of challenging situation for the very first time. He understands patient's prognosis is very poor and that he is not responding to antibiotics while also not a candidate for surgical intervention on infected areas. His hope is that patient's body can be transported to [place] after he passes away, as this is what his father and sister want. Will discuss with morgue and determine any options. He also would like his brother to hear  the information in Spanish tomorrow morning. Set up a follow up family meeting with patient's brother tomorrow at 11am.   Data processing manager for morgue manager Tim. Spoke with him later to receive advise regarding shipping body internationally. Called patient's brother to relay the information.    Questions and concerns addressed. PMT will continue to support holistically.   Length of Stay: 72   Physical Exam Vitals and nursing note reviewed.  Constitutional:      Appearance: He is cachectic. He is ill-appearing.     Interventions: He is intubated.  HENT:     Head: Normocephalic and atraumatic.  Cardiovascular:     Rate and Rhythm: Normal rate.  Pulmonary:     Effort: He is intubated.  Skin:    General: Skin is dry.  Neurological:     Comments: Some purposeful movement             Vital Signs:  BP 132/63   Pulse 70   Temp 98 F (36.7 C) (Oral)   Resp 20   Ht 5' 2 (1.575 m)   Wt 44.1 kg   SpO2 100%   BMI 17.78 kg/m  SpO2: SpO2: 100 % O2 Device: O2 Device: Ventilator O2 Flow Rate: O2 Flow Rate (L/min): 40 L/min      Palliative Assessment/Data: 10%   Palliative Care Assessment & Plan   Patient Profile: Palliative Care consult requested for goals of care discussion in this 53 y.o. 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  He was admitted on 12/14/2023 from home with inability to walk due to uncontrolled pain.  During workup MRI of sacrum showed discitis and osteomyelitis of L4-5 with associated paraspinal inflammatory changes and right psoas mass.  Patient started on IV vancomycin  and cefepime .   Assessment: Goals of care conversation End-stage renal disease on CRRT Acute hypoxic respiratory failure with  concern for ARDS, improving after proning Septic shock MRSA psoas abscess and discitis PAD s/p bilateral BKA's  Recommendations/Plan: Continue DNR Continue current care plan Patient's brother has realistic understanding of poor prognosis. He would like for his siblings to hear the same information and come to an agreement on comfort care Meeting scheduled for 10/16 at 11am Ongoing goals of care discussions PMT will continue to follow and support  Prognosis: Very poor  Discharge Planning: Anticipated Hospital Death  Care plan was discussed with patient's brother, MD, RN, Morgue manager Tim         Letanya Froh SHAUNNA Fell, PA-C  Palliative Medicine Team Team phone # [phone]  Thank you for allowing the Palliative Medicine Team to assist in the care of this patient. Please utilize secure chat with additional questions, if there is no response within 30 minutes please call the above phone number.  Palliative Medicine Team providers are available by phone from 7am to 7pm daily and can be reached through the team cell phone.  Should this patient require assistance outside of these hours, please call the patient's attending physician.     Time Total: 65  Visit consisted of counseling and education dealing with the complex and emotionally intense issues of symptom management and palliative care in the setting of serious and potentially life-threatening illness. Greater than 50% of this time was spent counseling and coordinating care related to the above assessment and plan.  Personally spent 65 minutes in patient care including extensive chart review (labs, imaging, progress/consult notes, vital signs), medically appropraite exam, discussed with treatment team, education to patient, family, and staff, documenting clinical information, medication review and management, coordination of care, and available advanced directive documents.

## 2024-02-25 NOTE — Progress Notes (Signed)
 ID brief note  D/w Dr Catherine, patient had a GOC family meeting and possible transitioning to comfort care tomorrow after another meeting scheduled tomorrow at 11am.   Will not actively follow from ID standpoint, please call back if any changes to above or questions or concerns    Annalee Orem, MD Infectious Disease Physician [facility] [address] [place], [place] [address] Phone: [phone]  Fax: [phone]

## 2024-02-25 NOTE — Progress Notes (Signed)
 Pharmacy Electrolyte Replacement  Recent Labs:  Recent Labs    02/25/24 0300  K 4.0  MG 2.2  PHOS 2.1*  CREATININE 0.63    Low Critical Values (K </= 2.5, Phos </= 1, Mg </= 1) Present: None  MD Contacted: Dr. Catherine    Plan:  Sodium phosphate  15 mmol IV x 1 per CRRT protocol  Renal function panel ordered for 1600, will re-evaluate replacement needs

## 2024-02-25 NOTE — Progress Notes (Signed)
 [facility] Progress Note  Subjective:  Yest I/O were net neg 1.7 L  Wts are down to 44kg Levo gtt at 1 micrograms this am CXR looks much better, some mild residual changes  Vitals:   02/25/24 1000 02/25/24 1100 02/25/24 1133 02/25/24 1200  BP:      Pulse: (!) 104 (!) 109  97  Resp: 14 18  18   Temp:   98.5 F (36.9 C)   TempSrc:   Oral   SpO2: 100% 100%  100%  Weight:      Height:        Exam: General: Chronically ill, frail, sedated on vent Heart: RRR Lungs: CTAB Abdomen: soft Extremities: 2+ bilat hip edema, B BKA Dialysis Access: TDC intact    OP HD: [facility] - MWF 3.5h  B400  45.5kg  TDC  Heparin  2000 Mircera 100 mcg - last dose 12/01/23 Calcitriol  1.0 mcg (on hold due to high Ca) Uses midodrine  10mg  TID + extra pre-HD    Assessment/ Plan: Shock: likely septic, on IV abx and pressor support per CCM 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 Has been on long course of IV Vancomycin . Repeat MRI 10/8 showed the infection has progressed. NSGY did not think he is a surgical candidate.  Is back on IV vanc/ zosyn  now.  Volume overload/ pulm edema: getting vol off w/ CRRT, wts are down 3kg. New dry wt is likely 40-41 kg, today is at 45 kg. Still edematous on exam. Will ^UF to 100- 150 cc/hr as tolerated.  ESRD: HD TTS though this admit.  Acute decompensation causing need for CRRT started 10/12.  BP: Chronic hypotension on mido 10mg  TID  Anemia of ESRD: Hgb 9.9 - continue Aranesp  150mcg q Friday. No IV iron  d/t infection. BMD: CorrCa high, VDRA on hold. Phos on goal, on sevelamer  as binder.  Nutrition: Alb low, continue supps and diet has been liberalized. Debility: PT/OT as tolerated. GOC: prognosis poor   Rob Geralynn MD  [facility] 02/25/2024, 12:51 PM  Recent Labs  Lab 02/24/24 1600 02/24/24 1816 02/25/24 0300 02/25/24 0842  HGB  --    < > 7.8* 7.5*  ALBUMIN  2.0*  --  1.9*  --   CALCIUM  7.9*  --  8.0*  --   PHOS 2.5   --  2.1*  --   CREATININE 0.69  --  0.63  --   K 4.3  --  4.0  --    < > = values in this interval not displayed.   No results for input(s): IRON , TIBC, FERRITIN in the last 168 hours. Inpatient medications:  acetaminophen   1,000 mg Per Tube TID   acyclovir ointment  1 Application Topical 6 X Daily   artificial tears   Right Eye QID   Chlorhexidine  Gluconate Cloth  6 each Topical Daily   darbepoetin (ARANESP ) injection - DIALYSIS  150 mcg Subcutaneous Q Fri-1800   dexamethasone  (DECADRON ) injection  20 mg Intravenous Q24H   Followed by   NOREEN ON 02/28/2024] dexamethasone  (DECADRON ) injection  10 mg Intravenous Q24H   docusate  100 mg Per Tube BID   dolutegravir   50 mg Per Tube Daily   emtricitabine -tenofovir  AF  1 tablet Per Tube Daily   feeding supplement (PROSource TF20)  60 mL Per Tube Daily   fentaNYL  (SUBLIMAZE ) injection  25-50 mcg Intravenous Once   gabapentin   100 mg Per Tube Q8H   gatifloxacin   1 drop Right Eye BID   insulin   aspart  0-5 Units Subcutaneous QHS   insulin  aspart  0-6 Units Subcutaneous TID WC   lidocaine   2 patch Transdermal Q24H   methocarbamol   500 mg Per Tube TID   midodrine   10 mg Per Tube TID WC   midodrine   10 mg Per Tube Q T,Th,Sa-HD   multivitamin  1 tablet Per Tube QHS   mouth rinse  15 mL Mouth Rinse Q2H   pantoprazole  (PROTONIX ) IV  40 mg Intravenous Q12H   polyethylene glycol  17 g Per Tube Daily   rocuronium   50 mg Intravenous Once   sodium chloride  flush  10-40 mL Intracatheter Q12H   thiamine  100 mg Per Tube Daily    feeding supplement (OSMOLITE 1.5 CAL) 15 mL/hr at 02/25/24 1206   fentaNYL  infusion INTRAVENOUS 175 mcg/hr (02/25/24 1200)   piperacillin -tazobactam Stopped (02/25/24 0900)   prismasol BGK 4/2.5 400 mL/hr at 02/24/24 1649   prismasol BGK 4/2.5 1,500 mL/hr at 02/25/24 1157   prismasol BGK 4/2.5 400 mL/hr at 02/24/24 1651   propofol  (DIPRIVAN ) infusion 10 mcg/kg/min (02/25/24 1200)   sodium PHOSPHATE  IVPB (in mmol)  43 mL/hr at 02/25/24 1200   vancomycin  Stopped (02/24/24 1634)   dextrose , fentaNYL , heparin , hydrALAZINE , ibuprofen , ipratropium-albuterol , lip balm, metoprolol  tartrate, ondansetron  (ZOFRAN ) IV, mouth rinse, oxyCODONE , sodium chloride , sodium chloride  flush

## 2024-02-25 NOTE — Progress Notes (Signed)
 NAMEVerlyn Daniels, MRN:  [id number], DOB:  16-Aug-1970, LOS: 72 ADMISSION DATE:  12/14/2023, CONSULTATION DATE:  02/24/2024 REFERRING MD:  MELODIE, CHIEF COMPLAINT:  septic shock, respiratory failure   History of Present Illness:   Alvin Daniels is a 53 y.o. male with medical history significant of ESRD on HD MWF, type 2 diabetes mellitus poorly controlled, HIV, chronic hepatitis C, PAD s/p bilateral BKA, and recent admission for MRSA septicemia, possible IE, and L middle finger OM s/p amputation per Ortho surgery on 5/29 (cultures grew E. 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   Pt was in USOH until this past week when he had difficulty walking due to the pain in his R hip. The pain would bother him all day, but he was able to walk most of the day and rest in the evening. Yesterday, the pain was so bad that he was unable to walk at all, so he presented to the ED for further evaluation. Of note, pt was recently admitted for MRSA septicemia and possible IE and was continued on IV vancomycin  w/ HD until 7/19, and d/c recommended repeat TEE to eval for clearance of IE.   In the ED, pt AFVSS. Labs showed K 3.4, Cr 9.06, CRP 15.2, and ESR 133. MRI sacrum showed discitis and osteomyelitis at L4-5 with associated paraspinal inflammatory changes and right psoas Abscess. EDP ordered IV vancomycin /cefepime , and requested medicine admission.    Patient was brought into ICU because of increasing vasopressor needs.  He was eventually intubated.  Pertinent  Medical History  ESRD DM HIV Chronic Hep C PAD s/p bilateral BKA  Significant Hospital Events: Including procedures, antibiotic start and stop dates in addition to other pertinent events   02/21/2024 intubated for respiratory failure and vasopressor therapy was increasing during the day because of septic shock.  02/23/2024: proned at 10:30 and supinated  at 3 AM 02/24/2024: remains supinated, did well overnight.  Interim History / Subjective:  Vasopressors: Levo weaned off Sedation: Propofol  20, Fentanyl  75 MV: PRVC 380/+8/40% I/O - 1.6 L  Objective    Blood pressure 132/63, pulse 70, temperature 98 F (36.7 C), temperature source Oral, resp. rate 20, height 5' 2 (1.575 m), weight 44.1 kg, SpO2 100%.    Vent Mode: PRVC FiO2 (%):  [40 %] 40 % Set Rate:  [20 bmp] 20 bmp Vt Set:  [380 mL-396 mL] 380 mL PEEP:  [8 cmH20-10 cmH20] 8 cmH20 Plateau Pressure:  [13 cmH20-22 cmH20] 20 cmH20   Intake/Output Summary (Last 24 hours) at 02/25/2024 0812 Last data filed at 02/25/2024 0700 Gross per 24 hour  Intake 2566.77 ml  Output 4148.9 ml  Net -1582.13 ml   Filed Weights   02/23/24 0306 02/24/24 0500 02/25/24 0434  Weight: 48.9 kg 45.3 kg 44.1 kg    Examination: General: cachectic, chronically ill appearing, deformity in R head area HENT: pupils pinpoint, sluggish Lungs: crackles in bases Cardiovascular: tachycardic, no murmurs Abdomen: soft, difficult to assess tenderness, ecchymoses noted in lower abdomen. Extremities: 1+ sacral edema, bilateral BKAs Neuro: obtunded, not response to commands, no response to noxious stimulation.  Resolved problem list   Assessment and Plan   #Septic Shock: likely septic in the setting of psoas abscess and discitis - D/C levophed  - Continue Midodrine  10 mg TID - Agree with neurosurgery consult for source control - Agree with ID consult for antimicrobial plan. - Target MAP > 65  mmHg  #Acute Hypoxic Respiratory Failure: concern for ARDS now s/p 1 session of proning with improved ventilator requirements. - Prevent lung injury - Low tidal volume ventilation - Aim for Pplat < 30 and DP < 15 - Lowest FiO2 to keep SPO2 > 90% and PaO2 > 65 mmHg - Permissive hypercapnia - pH goal > 7.2 - Daily SAT/SBT - s/p proning on 10/13 and improved dramatically. - DEXA ARDS steroids - Aggressive volume  removal with CRRT  #MRSA Discitis/Psoas Abscess: - Continue Vancomycin , no end date for now given that there's no surgical intervention at this time.  # Abundant GNR growing in trach aspirate: - Continue Zosyn  (end date: 02/27/2024)  #Stress Ulcer PPx: PPI  #Nutrition: advance diet as tolerated  #ESRD: - Continue CRRT - Renally adjust medications - Avoid Nephrotoxins - Strict I/Os - Foley catheter  #VTE ppx: holding heparin  subQ due to thrombocytopenia  #Thrombocytopenia: likely DIC and potentially some contribution from CRRT. - Continue to monitor PLT, goal > 10 K - Transfuse PLT to meet goal  #Hyperglycemia: - Sensitive SSI  #PADs: - Propofo; - Fentanyl   #GOC: Limited code DNR per discussion with brother (see IPAL note). Will continue to engage family with GOC discussions. The patient's mental status precludes extubation and he is unlikely to survive his critical illness.  Disposition: ICU appropriate for respiratory failure and shock state.  Labs   CBC: Recent Labs  Lab 02/19/24 0852 02/21/24 0422 02/22/24 1649 02/23/24 0120 02/23/24 0905 02/23/24 0940 02/23/24 1243 02/24/24 0454 02/24/24 0834 02/24/24 1816 02/25/24 0300  WBC 6.8   < > 7.0 4.0  --   --   --  6.3  --  6.0 6.2  NEUTROABS 5.6  --  6.2  --   --   --   --   --   --  5.9  --   HGB 9.9*   < > 6.7* 8.7*   < >  --  10.2* 8.5* 8.8* 8.2* 7.8*  HCT 30.0*   < > 20.2* 25.6*   < >  --  30.0* 24.4* 26.0* 23.9* 23.5*  MCV 98.4   < > 98.1 93.1  --   --   --  92.4  --  93.7 94.8  PLT 82*   < > 33* 26*  --  20*  --  12*  --  37* 32*   < > = values in this interval not displayed.    Basic Metabolic Panel: Recent Labs  Lab 02/23/24 0120 02/23/24 0905 02/23/24 1512 02/24/24 0454 02/24/24 0834 02/24/24 1600 02/25/24 0300  NA 127*   < > 127* 131* 134* 131* 132*  K 3.4*   < > 3.8 4.4 4.2 4.3 4.0  CL 97*  --  95* 98  --  98 100  CO2 21*  --  22 20*  --  24 23  GLUCOSE 104*  --  136* 183*  --  156*  160*  BUN 22*  --  13 8  --  8 12  CREATININE 1.70*  --  1.12 0.86  --  0.69 0.63  CALCIUM  7.7*  --  7.6* 7.6*  --  7.9* 8.0*  MG 1.8  --   --  2.1  --   --  2.2  PHOS 1.8*  --  1.5* 2.6  --  2.5 2.1*   < > = values in this interval not displayed.   GFR: Estimated Creatinine Clearance: 66.6 mL/min (by C-G formula based on SCr  of 0.63 mg/dL). Recent Labs  Lab 02/21/24 0813 02/22/24 1649 02/23/24 0120 02/24/24 0454 02/24/24 1816 02/25/24 0300  WBC  --    < > 4.0 6.3 6.0 6.2  LATICACIDVEN 1.3  --   --   --   --   --    < > = values in this interval not displayed.    Liver Function Tests: Recent Labs  Lab 02/23/24 0120 02/23/24 1512 02/24/24 0454 02/24/24 1600 02/25/24 0300  ALBUMIN  2.1* 2.0* 1.8* 2.0* 1.9*   No results for input(s): LIPASE, AMYLASE in the last 168 hours. No results for input(s): AMMONIA in the last 168 hours.  ABG    Component Value Date/Time   PHART 7.438 02/24/2024 0834   PCO2ART 33.4 02/24/2024 0834   PO2ART 119 (H) 02/24/2024 0834   HCO3 22.7 02/24/2024 0834   TCO2 24 02/24/2024 0834   ACIDBASEDEF 1.0 02/24/2024 0834   O2SAT 99 02/24/2024 0834     Coagulation Profile: Recent Labs  Lab 02/23/24 0940  INR 1.9*    Cardiac Enzymes: Recent Labs  Lab 02/18/24 1835  CKTOTAL 35*    HbA1C: Hemoglobin A1C  Date/Time Value Ref Range Status  07/17/2023 04:28 PM 10.0 (A) 4.0 - 5.6 % Final  01/29/2023 03:22 PM 8.5 (A) 4.0 - 5.6 % Final   HbA1c, POC (controlled diabetic range)  Date/Time Value Ref Range Status  09/28/2018 02:21 PM 6.8 0.0 - 7.0 % Final   Hgb A1c MFr Bld  Date/Time Value Ref Range Status  01/28/2024 04:00 AM 5.9 (H) 4.8 - 5.6 % Final    Comment:    (NOTE) Diagnosis of Diabetes The following HbA1c ranges recommended by the American Diabetes Association (ADA) may be used as an aid in the diagnosis of diabetes mellitus.  Hemoglobin             Suggested A1C NGSP%              Diagnosis  <5.7                    Non Diabetic  5.7-6.4                Pre-Diabetic  >6.4                   Diabetic  <7.0                   Glycemic control for                       adults with diabetes.    08/01/2022 11:53 PM 10.7 (H) 4.8 - 5.6 % Final    Comment:    (NOTE)         Prediabetes: 5.7 - 6.4         Diabetes: >6.4         Glycemic control for adults with diabetes: <7.0     CBG: Recent Labs  Lab 02/24/24 1546 02/24/24 1909 02/24/24 2313 02/25/24 0304 02/25/24 0715  GLUCAP 162* 143* 161* 158* 135*    Review of Systems:   Not obtained  Past Medical History:  He,  has a past medical history of Acute bilateral low back pain (01/03/2021), AIDS ([name]) (11/22/2014), Back pain (06/09/2019), BKA stump complication ([name]) (06/09/2019), Chronic diarrhea, Chronic hepatitis C without hepatic coma ([name]) (11/22/2014), 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[name]), Corneal ulcer (12/18/2022), 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   Surgical History:   Past Surgical History:  Procedure Laterality Date   AMPUTATION Left 04/20/2014   Procedure: 3rd toe amputation, 4th Toe Amputation,  5th Toe Amputation;  Surgeon: Jerona Harden GAILS, MD;  Location: [facility] OR;  Service: Orthopedics;  Laterality: Left;   AMPUTATION Left 05/02/2014   Procedure: Midfoot Amputation;  Surgeon: Jerona Harden GAILS, MD;  Location: [facility] OR;  Service: Orthopedics;  Laterality: Left;   AMPUTATION Left 06/17/2014   Procedure: AMPUTATION BELOW KNEE;  Surgeon: Jerona Harden GAILS, MD;  Location: [facility] OR;  Service: Orthopedics;   Laterality: Left;   AMPUTATION Right 09/04/2018   Procedure: RIGHT BELOW KNEE AMPUTATION;  Surgeon: Harden Jerona GAILS, MD;  Location: [facility] OR;  Service: Orthopedics;  Laterality: Right;  RIGHT BELOW KNEE AMPUTATION   AMPUTATION FINGER Left 10/09/2023   Procedure: AMPUTATION, FINGER;  Surgeon: Romona Harari, MD;  Location: [facility] OR;  Service: Orthopedics;  Laterality: Left;  Left Ring Finger amputation   AV FISTULA PLACEMENT Left    AV FISTULA PLACEMENT Left 05/10/2016   Procedure: Creation Left Arm Brachiocephalic Arteriovenous Fistula and Ligation of Radiocephalic Fistula;  Surgeon: Lonni GORMAN Blade, MD;  Location: [facility] OR;  Service: Vascular;  Laterality: Left;   DIALYSIS/PERMA CATHETER INSERTION N/A 09/01/2023   Procedure: DIALYSIS/PERMA CATHETER INSERTION;  Surgeon: Melia Lynwood ORN, MD;  Location: [facility] INVASIVE CV LAB;  Service: Cardiovascular;  Laterality: N/A;   FEMUR IM NAIL Left 08/17/2016   Procedure: INTRAMEDULLARY (IM) RETROGRADE FEMORAL NAILING;  Surgeon: Oneil JAYSON Herald, MD;  Location: [facility] OR;  Service: Orthopedics;  Laterality: Left;   FOOT AMPUTATION THROUGH ANKLE Left 12/'21/2015   midfoot   IR AV DIALY SHUNT INTRO NEEDLE/INTRACATH INITIAL W/PTA/IMG LEFT  04/17/2023   IR FLUORO GUIDE CV LINE RIGHT  10/14/2023   IR FLUORO GUIDE CV LINE RIGHT  11/11/2023   IR GENERIC HISTORICAL Left 05/01/2016   IR THROMBECTOMY AV FISTULA W/THROMBOLYSIS/PTA INC/SHUNT/IMG LEFT 05/01/2016 Toribio Faes, MD [facility]   IR GENERIC HISTORICAL  05/01/2016   IR US  GUIDE VASC ACCESS LEFT 05/01/2016 Toribio Faes, MD [facility]   IR GENERIC HISTORICAL  05/07/2016   IR FLUORO GUIDE CV LINE RIGHT 05/07/2016 Ami Bellman, DO [facility]   IR GENERIC HISTORICAL  05/07/2016   IR US  GUIDE VASC ACCESS RIGHT 05/07/2016 Ami Bellman, DO [facility]   IR GENERIC HISTORICAL  05/22/2016   IR US  GUIDE VASC ACCESS RIGHT 05/22/2016 Ozell Specking, MD [facility]   IR GENERIC HISTORICAL  05/22/2016   IR FLUORO GUIDE CV LINE  RIGHT 05/22/2016 Ozell Specking, MD [facility]   IR LUMBAR DISC ASPIRATION W/IMG GUIDE  01/07/2024   IR REMOVAL TUN CV CATH W/O FL  08/21/2016   IR REMOVAL TUN CV CATH W/O FL  10/12/2023   IR US  GUIDE VASC ACCESS LEFT  04/17/2023   IR US  GUIDE VASC ACCESS RIGHT  10/14/2023   PERIPHERAL VASCULAR CATHETERIZATION Left 05/09/2016   Procedure: A/V Fistulagram;  Surgeon: Lonni GORMAN Blade, MD;  Location: [facility] INVASIVE CV LAB;  Service: Cardiovascular;  Laterality: Left;  arm   TEE WITHOUT CARDIOVERSION N/A 06/22/2018   Procedure: TRANSESOPHAGEAL ECHOCARDIOGRAM (TEE);  Surgeon: Jeffrie Oneil JAYSON, MD;  Location: [facility] ENDOSCOPY;  Service: Cardiovascular;  Laterality: N/A;  TRANSESOPHAGEAL ECHOCARDIOGRAM (CATH LAB) N/A 10/15/2023   Procedure: TRANSESOPHAGEAL ECHOCARDIOGRAM;  Surgeon: Rolan Ezra RAMAN, MD;  Location: [facility] INVASIVE CV LAB;  Service: Cardiovascular;  Laterality: N/A;     Social History:   reports that he has never smoked. He has never used smokeless tobacco. He reports that he does not drink alcohol  and does not use drugs.   Family History:  His family history includes Diabetes in his father and mother.   Allergies No Known Allergies   Home Medications  Prior to Admission medications   Medication Sig Start Date End Date Taking? Authorizing Provider  bictegravir-emtricitabine -tenofovir  AF (BIKTARVY ) 50-200-25 MG TABS tablet Take 1 tablet by mouth daily. 04/16/23  Yes Fleeta Rothman, Jomarie SAILOR, MD  insulin  aspart protamine  - aspart (NOVOLOG  70/30 FLEXPEN) (70-30) 100 UNIT/ML FlexPen Inject 20 Units into the skin 2 (two) times daily. Patient taking differently: Inject 10 Units into the skin 2 (two) times daily. 07/17/23  Yes Tanda Bleacher, MD  ZYPITAMAG  4 MG TABS Take by mouth. 11/26/23  Yes [provider]  gabapentin  (NEURONTIN ) 100 MG capsule Take 1 capsule (100 mg total) by mouth at bedtime. Patient not taking: Reported on 12/16/2023 10/17/23 01/15/24  [place], Camellia PARAS, DO   HYDROcodone -acetaminophen  (NORCO/VICODIN) 5-325 MG tablet Take 1 tablet by mouth every 6 (six) hours as needed for severe pain (pain score 7-10) or moderate pain (pain score 4-6). Patient not taking: Reported on 12/15/2023 10/17/23   [place], Camellia PARAS, DO  midodrine  (PROAMATINE ) 10 MG tablet Take 1 tablet (10 mg total) by mouth 3 (three) times daily as needed for low blood pressure, SBP</=100. may take 30 min prior to dialysis and mid treatment as needed. Patient not taking: Reported on 12/16/2023 09/05/23     bictegravir-emtricitabine -tenofovir  AF (BIKTARVY ) 50-200-25 MG TABS tablet Take 1 tablet by mouth daily. 01/02/22   Fleeta Rothman Jomarie SAILOR, MD     Due to a high probability of clinically significant, life threatening deterioration, the patient required my highest level of preparedness to intervene emergently and I personally spent this critical care time directly and personally managing the patient. This critical care time included obtaining a history; examining the patient; pulse oximetry; ordering and review of studies; arranging urgent treatment with development of a management plan; evaluation of patient's response to treatment; frequent reassessment; and, discussions with other providers.  This critical care time was performed to assess and manage the high probability of imminent, life-threatening deterioration that could result in multi-organ failure. It was exclusive of separately billable procedures and treating other patients and teaching time. Critical Care Time: 35 minutes.  Paula Southerly, MD [facility]

## 2024-02-25 NOTE — Progress Notes (Addendum)
 Pharmacy Electrolyte Replacement  Recent Labs:  Recent Labs    02/25/24 0300 02/25/24 1531  K 4.0 4.2  MG 2.2  --   PHOS 2.1* 2.8  CREATININE 0.63 0.54*    Low Critical Values (K </= 2.5, Phos </= 1, Mg </= 1) Present: None  Plan:  Kphos neutral 2 tablets (500 mg) per tube q4 h x4 doses per CRRT protocol  Renal function panel ordered for 1600 [10/16], will re-evaluate replacement needs [last dose scheduled for 0630]  Benedetta Heath BS, PharmD, BCPS Clinical Pharmacist 02/25/2024 4:52 PM  Contact: [phone] after 3 PM

## 2024-02-26 LAB — RENAL FUNCTION PANEL
Albumin: 2 g/dL — ABNORMAL LOW (ref 3.5–5.0)
Anion gap: 10 (ref 5–15)
BUN: 20 mg/dL (ref 6–20)
CO2: 24 mmol/L (ref 22–32)
Calcium: 8.1 mg/dL — ABNORMAL LOW (ref 8.9–10.3)
Chloride: 101 mmol/L (ref 98–111)
Creatinine, Ser: 0.7 mg/dL (ref 0.61–1.24)
GFR, Estimated: >60 mL/min (ref >60)
Glucose, Bld: 165 mg/dL — ABNORMAL HIGH (ref 70–99)
Phosphorus: 1.7 mg/dL — ABNORMAL LOW (ref 2.5–4.6)
Potassium: 4.2 mmol/L (ref 3.5–5.1)
Sodium: 135 mmol/L (ref 135–145)

## 2024-02-26 LAB — CBC WITH DIFFERENTIAL/PLATELET
Abs Immature Granulocytes: 0.02 K/uL (ref 0.00–0.07)
Basophils Absolute: 0 K/uL (ref 0.0–0.1)
Basophils Relative: 0 %
Eosinophils Absolute: 0 K/uL (ref 0.0–0.5)
Eosinophils Relative: 0 %
HCT: 25.6 % — ABNORMAL LOW (ref 39.0–52.0)
Hemoglobin: 8.6 g/dL — ABNORMAL LOW (ref 13.0–17.0)
Immature Granulocytes: 0 %
Lymphocytes Relative: 14 %
Lymphs Abs: 0.9 K/uL (ref 0.7–4.0)
MCH: 32.1 pg (ref 26.0–34.0)
MCHC: 33.6 g/dL (ref 30.0–36.0)
MCV: 95.5 fL (ref 80.0–100.0)
Monocytes Absolute: 0.1 K/uL (ref 0.1–1.0)
Monocytes Relative: 2 %
Neutro Abs: 5.6 K/uL (ref 1.7–7.7)
Neutrophils Relative %: 84 %
Platelets: 42 K/uL — ABNORMAL LOW (ref 150–400)
RBC: 2.68 MIL/uL — ABNORMAL LOW (ref 4.22–5.81)
RDW: 20.3 % — ABNORMAL HIGH (ref 11.5–15.5)
Smear Review: NORMAL
WBC: 6.8 K/uL (ref 4.0–10.5)
nRBC: 0 % (ref 0.0–0.2)

## 2024-02-26 LAB — COMPREHENSIVE METABOLIC PANEL WITH GFR
ALT: 19 U/L (ref 0–44)
AST: 24 U/L (ref 15–41)
Albumin: 2.1 g/dL — ABNORMAL LOW (ref 3.5–5.0)
Alkaline Phosphatase: 144 U/L — ABNORMAL HIGH (ref 38–126)
Anion gap: 9 (ref 5–15)
BUN: 16 mg/dL (ref 6–20)
CO2: 23 mmol/L (ref 22–32)
Calcium: 8.3 mg/dL — ABNORMAL LOW (ref 8.9–10.3)
Chloride: 101 mmol/L (ref 98–111)
Creatinine, Ser: 0.47 mg/dL — ABNORMAL LOW (ref 0.61–1.24)
GFR, Estimated: >60 mL/min (ref >60)
Glucose, Bld: 93 mg/dL (ref 70–99)
Potassium: 4.1 mmol/L (ref 3.5–5.1)
Sodium: 133 mmol/L — ABNORMAL LOW (ref 135–145)
Total Bilirubin: 1.3 mg/dL — ABNORMAL HIGH (ref 0.0–1.2)
Total Protein: 7.4 g/dL (ref 6.5–8.1)

## 2024-02-26 LAB — PREPARE PLATELET PHERESIS: Unit division: 0

## 2024-02-26 LAB — GLUCOSE, CAPILLARY
Glucose-Capillary: 88 mg/dL (ref 70–99)
Glucose-Capillary: 148 mg/dL — ABNORMAL HIGH (ref 70–99)
Glucose-Capillary: 152 mg/dL — ABNORMAL HIGH (ref 70–99)
Glucose-Capillary: 110 mg/dL — ABNORMAL HIGH (ref 70–99)

## 2024-02-26 LAB — CULTURE, BLOOD (ROUTINE X 2)
Culture: NO GROWTH
Culture: NO GROWTH
Special Requests: ADEQUATE

## 2024-02-26 LAB — BPAM PLATELET PHERESIS
Blood Product Expiration Date: 202510172359
ISSUE DATE / TIME: 202510150412
Unit Type and Rh: 6200

## 2024-02-26 LAB — MAGNESIUM: Magnesium: 2.5 mg/dL — ABNORMAL HIGH (ref 1.7–2.4)

## 2024-02-26 MED ORDER — SODIUM PHOSPHATES 45 MMOLE/15ML IV SOLN
30.0000 mmol | Freq: Once | INTRAVENOUS | Status: AC
  Administered 2024-02-26: 30 mmol via INTRAVENOUS
  Filled 2024-02-26: qty 10

## 2024-02-26 NOTE — Progress Notes (Signed)
 [facility] Progress Note  Subjective:  Net I/O neg 2.0 L yest, and 1.2 L today so far Wts are down at 41kg Levo gtt 0-2 today so far  Vitals:   02/26/24 0930 02/26/24 0945 02/26/24 1000 02/26/24 1015  BP:      Pulse: (!) 104 (!) 105 (!) 112 (!) 115  Resp: 13 15 17 14   Temp: 98.2 F (36.8 C) 98.2 F (36.8 C) 98.4 F (36.9 C) 98.2 F (36.8 C)  TempSrc:      SpO2: 100% 100% 92% 100%  Weight:      Height:        Exam: General: Chronically ill, frail, cachectic, on vent Heart: RRR Lungs: CTAB Abdomen: soft Extremities: bilat hip edema improving, still 1+, B BKA Dialysis Access: TDC intact    OP HD: [facility] - MWF 3.5h  B400  45.5kg  TDC  Heparin  2000 Mircera 100 mcg - last dose 12/01/23 Calcitriol  1.0 mcg (on hold due to high Ca) Uses midodrine  10mg  TID + extra pre-HD    Assessment/ Plan: Shock: likely septic, on IV abx and pressor support per CCM L4-5 discitis/osteomyelitis/psoas abscess: S/p IR aspiration 8/27, grew MRSA. Had a long course of IV Vancomycin , however, repeat MRI 10/8 showed the infection has progressed. Per NSGY not a surgical candidate.  Is back on IV vanc/ zosyn  now.  Volume overload/ pulm edema: CXR 10/11 and 11/12 showed severe worsening pulm edema. CXR 11/14 showed sig improvement after vol removal. Cont UF 100- 150 cc/hr.  ESRD: HD TTS though this admit.  Acute decompensation causing need for CRRT started 10/12. Continue.  BP: Chronic hypotension on mido 10mg  TID  Anemia of ESRD: Hgb 9.9 - continue Aranesp  150mcg q Friday. No IV iron  d/t infection. BMD: CorrCa high, VDRA on hold. Phos on goal, on sevelamer  as binder.  Nutrition: Alb low, continue supps and diet has been liberalized. Debility: PT/OT as tolerated. GOC: prognosis poor   Rob Geralynn MD  [facility] 02/26/2024, 11:03 AM  Recent Labs  Lab 02/25/24 0300 02/25/24 0842 02/25/24 1531 02/26/24 0541  HGB 7.8* 7.5*  --  8.6*  ALBUMIN  1.9*  --  1.9* 2.1*  CALCIUM  8.0*  --  8.1* 8.3*   PHOS 2.1*  --  2.8  --   CREATININE 0.63  --  0.54* 0.47*  K 4.0  --  4.2 4.1   No results for input(s): IRON , TIBC, FERRITIN in the last 168 hours. Inpatient medications:  acetaminophen   1,000 mg Per Tube TID   acyclovir ointment  1 Application Topical 6 X Daily   Chlorhexidine  Gluconate Cloth  6 each Topical Daily   darbepoetin (ARANESP ) injection - DIALYSIS  150 mcg Subcutaneous Q Fri-1800   dexamethasone  (DECADRON ) injection  20 mg Intravenous Q24H   Followed by   NOREEN ON 02/28/2024] dexamethasone  (DECADRON ) injection  10 mg Intravenous Q24H   docusate  100 mg Per Tube BID   dolutegravir   50 mg Per Tube Daily   emtricitabine -tenofovir  AF  1 tablet Per Tube Daily   feeding supplement (PROSource TF20)  60 mL Per Tube Daily   fentaNYL  (SUBLIMAZE ) injection  25-50 mcg Intravenous Once   gabapentin   100 mg Per Tube Q8H   gatifloxacin   1 drop Right Eye BID   insulin  aspart  0-5 Units Subcutaneous QHS   insulin  aspart  0-6 Units Subcutaneous TID WC   methocarbamol   500 mg Per Tube TID   midodrine   10 mg Per Tube TID WC  midodrine   10 mg Per Tube Q T,Th,Sa-HD   multivitamin  1 tablet Per Tube QHS   mouth rinse  15 mL Mouth Rinse Q2H   pantoprazole  (PROTONIX ) IV  40 mg Intravenous Q12H   polyethylene glycol  17 g Per Tube Daily   rocuronium   50 mg Intravenous Once   sodium chloride  flush  10-40 mL Intracatheter Q12H    feeding supplement (OSMOLITE 1.5 CAL) 15 mL/hr at 02/26/24 1000   fentaNYL  infusion INTRAVENOUS 75 mcg/hr (02/26/24 1000)   norepinephrine  (LEVOPHED ) Adult infusion Stopped (02/26/24 0950)   piperacillin -tazobactam 3.375 g (02/26/24 1010)   prismasol BGK 4/2.5 400 mL/hr at 02/26/24 0741   prismasol BGK 4/2.5 1,500 mL/hr at 02/26/24 0951   prismasol BGK 4/2.5 400 mL/hr at 02/26/24 0740   propofol  (DIPRIVAN ) infusion Stopped (02/26/24 0920)   vancomycin  Stopped (02/25/24 1608)   dextrose , fentaNYL , heparin , hydrALAZINE , ibuprofen , ipratropium-albuterol ,  lip balm, metoprolol  tartrate, ondansetron  (ZOFRAN ) IV, mouth rinse, oxyCODONE , sodium chloride , sodium chloride  flush

## 2024-02-26 NOTE — Progress Notes (Signed)
 NAMEBrees Hounshell, MRN:  [id number], DOB:  [birth year], LOS: 73 ADMISSION DATE:  12/14/2023, CONSULTATION DATE:  02/24/2024 REFERRING MD:  MELODIE, CHIEF COMPLAINT:  septic shock, respiratory failure   History of Present Illness:   Savier Balderson is a 53 y.o. male with medical history significant of ESRD on HD MWF, type 2 diabetes mellitus poorly controlled, HIV, chronic hepatitis C, PAD s/p bilateral BKA, and recent admission for MRSA septicemia, possible IE, and L middle finger OM s/p amputation per Ortho surgery on 5/29 (cultures grew E. 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   Pt was in [facility] until this past week when he had difficulty walking due to the pain in his R hip. The pain would bother him all day, but he was able to walk most of the day and rest in the evening. Yesterday, the pain was so bad that he was unable to walk at all, so he presented to the ED for further evaluation. Of note, pt was recently admitted for MRSA septicemia and possible IE and was continued on IV vancomycin  w/ HD until 7/19, and d/c recommended repeat TEE to eval for clearance of IE.   In the ED, pt AFVSS. Labs showed K 3.4, Cr 9.06, CRP 15.2, and ESR 133. MRI sacrum showed discitis and osteomyelitis at L4-5 with associated paraspinal inflammatory changes and right psoas Abscess. EDP ordered IV vancomycin /cefepime , and requested medicine admission.    Patient was brought into ICU because of increasing vasopressor needs.  He was eventually intubated.  Pertinent  Medical History  ESRD DM HIV Chronic Hep C PAD s/p bilateral BKA  Significant Hospital Events: Including procedures, antibiotic start and stop dates in addition to other pertinent events   02/21/2024 intubated for respiratory failure and vasopressor therapy was increasing during the day because of septic shock.  02/23/2024: proned at 10:30 and supinated  at 3 AM 02/24/2024: remains supinated, did well overnight. 02/25/2024: had family meeting with brother, heading to [place], interested in moving his body to hometown  Interim History / Subjective:  Vasopressors: Levo 2 mcg Sedation: Propofol  10, Fentanyl  50 MV: PRVC 380/+8/40% I/O - 2.4 L  Objective    Blood pressure 132/63, pulse 97, temperature 97.7 F (36.5 C), resp. rate 20, height 5' 2 (1.575 m), weight 40.8 kg, SpO2 100%.    Vent Mode: PRVC FiO2 (%):  [40 %] 40 % Set Rate:  [20 bmp] 20 bmp Vt Set:  [380 mL] 380 mL PEEP:  [8 cmH20] 8 cmH20 Plateau Pressure:  [18 cmH20-20 cmH20] 20 cmH20   Intake/Output Summary (Last 24 hours) at 02/26/2024 0812 Last data filed at 02/26/2024 0800 Gross per 24 hour  Intake 1965.25 ml  Output 4394 ml  Net -2428.75 ml   Filed Weights   02/24/24 0500 02/25/24 0434 02/26/24 0500  Weight: 45.3 kg 44.1 kg 40.8 kg    Examination: General: cachectic, chronically ill appearing, deformity in R head area HENT: pupils reactive to light Lungs: CTAB anteriorly Cardiovascular: tachycardic, no murmurs Abdomen: soft, difficult to assess tenderness, ecchymoses noted in lower abdomen. Extremities: 1+ sacral edema, bilateral BKAs Neuro: moves upper extremities non-purposefully, does not follow commands  Resolved problem list   Assessment and Plan   #Septic Shock: likely septic in the setting of psoas abscess and discitis - Wean levophed  as tolerated - Continue Midodrine  10 mg TID - Target MAP > 65 mmHg  #Acute Hypoxic Respiratory  Failure: concern for ARDS now s/p 1 session of proning with improved ventilator requirements. - Prevent lung injury - Low tidal volume ventilation - Aim for Pplat < 30 and DP < 15 - Lowest FiO2 to keep SPO2 > 90% and PaO2 > 65 mmHg - Permissive hypercapnia - pH goal > 7.2 - Daily SAT/SBT - s/p proning on 10/13 and improved dramatically. - DEXA ARDS steroids - Aggressive volume removal with CRRT  #MRSA  Discitis/Psoas Abscess: - Continue Vancomycin , no end date for now given that there's no surgical intervention at this time.  # Abundant GNR growing in trach aspirate: - Continue Zosyn  (end date: 02/27/2024)  #Stress Ulcer PPx: PPI  #Nutrition: advance diet as tolerated  #ESRD: - Continue CRRT - Renally adjust medications - Avoid Nephrotoxins - Strict I/Os - Foley catheter  #VTE ppx: holding heparin  subQ due to thrombocytopenia  #Thrombocytopenia: likely DIC and potentially some contribution from CRRT. - Continue to monitor PLT, goal > 10 K - Transfuse PLT to meet goal  #Hyperglycemia: - Sensitive SSI  #PADs: - Propofo; - Fentanyl   #GOC: palliative medicine is involved and will have family meeting today with brother regarding long term goals of care.  Disposition: ICU appropriate for respiratory failure and shock state.  Labs   CBC: Recent Labs  Lab 02/19/24 0852 02/21/24 0422 02/22/24 1649 02/23/24 0120 02/24/24 0454 02/24/24 0834 02/24/24 1816 02/25/24 0300 02/25/24 0842 02/26/24 0541  WBC 6.8   < > 7.0   < > 6.3  --  6.0 6.2 6.9 6.8  NEUTROABS 5.6  --  6.2  --   --   --  5.9  --   --  5.6  HGB 9.9*   < > 6.7*   < > 8.5* 8.8* 8.2* 7.8* 7.5* 8.6*  HCT 30.0*   < > 20.2*   < > 24.4* 26.0* 23.9* 23.5* 22.3* 25.6*  MCV 98.4   < > 98.1   < > 92.4  --  93.7 94.8 94.1 95.5  PLT 82*   < > 33*   < > 12*  --  37* 32* 43* 42*   < > = values in this interval not displayed.    Basic Metabolic Panel: Recent Labs  Lab 02/23/24 0120 02/23/24 0905 02/23/24 1512 02/24/24 0454 02/24/24 0834 02/24/24 1600 02/25/24 0300 02/25/24 1531 02/26/24 0541  NA 127*   < > 127* 131* 134* 131* 132* 132* 133*  K 3.4*   < > 3.8 4.4 4.2 4.3 4.0 4.2 4.1  CL 97*  --  95* 98  --  98 100 97* 101  CO2 21*  --  22 20*  --  24 23 23 23   GLUCOSE 104*  --  136* 183*  --  156* 160* 158* 93  BUN 22*  --  13 8  --  8 12 17 16   CREATININE 1.70*  --  1.12 0.86  --  0.69 0.63 0.54* 0.47*   CALCIUM  7.7*  --  7.6* 7.6*  --  7.9* 8.0* 8.1* 8.3*  MG 1.8  --   --  2.1  --   --  2.2  --  2.5*  PHOS 1.8*  --  1.5* 2.6  --  2.5 2.1* 2.8  --    < > = values in this interval not displayed.   GFR: Estimated Creatinine Clearance: 61.6 mL/min (A) (by C-G formula based on SCr of 0.47 mg/dL (L)). Recent Labs  Lab 02/21/24 0813 02/22/24 1649 02/24/24 1816  02/25/24 0300 02/25/24 0842 02/26/24 0541  WBC  --    < > 6.0 6.2 6.9 6.8  LATICACIDVEN 1.3  --   --   --   --   --    < > = values in this interval not displayed.    Liver Function Tests: Recent Labs  Lab 02/24/24 0454 02/24/24 1600 02/25/24 0300 02/25/24 1531 02/26/24 0541  AST  --   --   --   --  24  ALT  --   --   --   --  19  ALKPHOS  --   --   --   --  144*  BILITOT  --   --   --   --  1.3*  PROT  --   --   --   --  7.4  ALBUMIN  1.8* 2.0* 1.9* 1.9* 2.1*   No results for input(s): LIPASE, AMYLASE in the last 168 hours. No results for input(s): AMMONIA in the last 168 hours.  ABG    Component Value Date/Time   PHART 7.438 02/24/2024 0834   PCO2ART 33.4 02/24/2024 0834   PO2ART 119 (H) 02/24/2024 0834   HCO3 22.7 02/24/2024 0834   TCO2 24 02/24/2024 0834   ACIDBASEDEF 1.0 02/24/2024 0834   O2SAT 99 02/24/2024 0834     Coagulation Profile: Recent Labs  Lab 02/23/24 0940  INR 1.9*    Cardiac Enzymes: No results for input(s): CKTOTAL, CKMB, CKMBINDEX, TROPONINI in the last 168 hours.   HbA1C: Hemoglobin A1C  Date/Time Value Ref Range Status  07/17/2023 04:28 PM 10.0 (A) 4.0 - 5.6 % Final  01/29/2023 03:22 PM 8.5 (A) 4.0 - 5.6 % Final   HbA1c, POC (controlled diabetic range)  Date/Time Value Ref Range Status  09/28/2018 02:21 PM 6.8 0.0 - 7.0 % Final   Hgb A1c MFr Bld  Date/Time Value Ref Range Status  01/28/2024 04:00 AM 5.9 (H) 4.8 - 5.6 % Final    Comment:    (NOTE) Diagnosis of Diabetes The following HbA1c ranges recommended by the American Diabetes Association (ADA) may  be used as an aid in the diagnosis of diabetes mellitus.  Hemoglobin             Suggested A1C NGSP%              Diagnosis  <5.7                   Non Diabetic  5.7-6.4                Pre-Diabetic  >6.4                   Diabetic  <7.0                   Glycemic control for                       adults with diabetes.    08/01/2022 11:53 PM 10.7 (H) 4.8 - 5.6 % Final    Comment:    (NOTE)         Prediabetes: 5.7 - 6.4         Diabetes: >6.4         Glycemic control for adults with diabetes: <7.0     CBG: Recent Labs  Lab 02/25/24 1119 02/25/24 1531 02/25/24 1923 02/25/24 2156 02/26/24 0755  GLUCAP 143* 138* 155* 127* 88    Review of  Systems:   Not obtained  Past Medical History:  He,  has a past medical history of Acute bilateral low back pain (01/03/2021), AIDS ([name]) (11/22/2014), Back pain (06/09/2019), BKA stump complication ([name]) (06/09/2019), Chronic diarrhea, Chronic hepatitis C without hepatic coma ([name]) (11/22/2014), 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[name]), Corneal ulcer (12/18/2022), 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   Surgical History:   Past Surgical History:  Procedure Laterality Date   AMPUTATION Left 04/20/2014   Procedure: 3rd toe amputation, 4th Toe Amputation,  5th Toe Amputation;  Surgeon: Jerona Harden GAILS, MD;  Location: [facility] OR;  Service: Orthopedics;  Laterality: Left;   AMPUTATION Left 05/02/2014   Procedure: Midfoot Amputation;  Surgeon: Jerona Harden GAILS, MD;  Location[facility] OR;  Service: Orthopedics;   Laterality: Left;   AMPUTATION Left 06/17/2014   Procedure: AMPUTATION BELOW KNEE;  Surgeon: Jerona Harden GAILS, MD;  Location: [facility] OR;  Service: Orthopedics;  Laterality: Left;   AMPUTATION Right 09/04/2018   Procedure: RIGHT BELOW KNEE AMPUTATION;  Surgeon: Harden Jerona GAILS, MD;  Location: [facility] OR;  Service: Orthopedics;  Laterality: Right;  RIGHT BELOW KNEE AMPUTATION   AMPUTATION FINGER Left 10/09/2023   Procedure: AMPUTATION, FINGER;  Surgeon: Romona Harari, MD;  Location: [facility] OR;  Service: Orthopedics;  Laterality: Left;  Left Ring Finger amputation   AV FISTULA PLACEMENT Left    AV FISTULA PLACEMENT Left 05/10/2016   Procedure: Creation Left Arm Brachiocephalic Arteriovenous Fistula and Ligation of Radiocephalic Fistula;  Surgeon: Lonni GORMAN Blade, MD;  Location: [facility] OR;  Service: Vascular;  Laterality: Left;   DIALYSIS/PERMA CATHETER INSERTION N/A 09/01/2023   Procedure: DIALYSIS/PERMA CATHETER INSERTION;  Surgeon: Melia Lynwood ORN, MD;  Location: [facility] INVASIVE CV LAB;  Service: Cardiovascular;  Laterality: N/A;   FEMUR IM NAIL Left 08/17/2016   Procedure: INTRAMEDULLARY (IM) RETROGRADE FEMORAL NAILING;  Surgeon: Oneil JAYSON Herald, MD;  Location: [facility] OR;  Service: Orthopedics;  Laterality: Left;   FOOT AMPUTATION THROUGH ANKLE Left 12/'21/2015   midfoot   IR AV DIALY SHUNT INTRO NEEDLE/INTRACATH INITIAL W/PTA/IMG LEFT  04/17/2023   IR FLUORO GUIDE CV LINE RIGHT  10/14/2023   IR FLUORO GUIDE CV LINE RIGHT  11/11/2023   IR GENERIC HISTORICAL Left 05/01/2016   IR THROMBECTOMY AV FISTULA W/THROMBOLYSIS/PTA INC/SHUNT/IMG LEFT 05/01/2016 Toribio Faes, MD [facility]   IR GENERIC HISTORICAL  05/01/2016   IR US  GUIDE VASC ACCESS LEFT 05/01/2016 Toribio Faes, MD [facility]   IR GENERIC HISTORICAL  05/07/2016   IR FLUORO GUIDE CV LINE RIGHT 05/07/2016 Ami Bellman, DO [facility]   IR GENERIC HISTORICAL  05/07/2016   IR US  GUIDE VASC ACCESS RIGHT 05/07/2016 Ami Bellman, DO [facility]   IR GENERIC  HISTORICAL  05/22/2016   IR US  GUIDE VASC ACCESS RIGHT 05/22/2016 Ozell Specking, MD [facility]   IR GENERIC HISTORICAL  05/22/2016   IR FLUORO GUIDE CV LINE RIGHT 05/22/2016 Ozell Specking, MD [facility]   IR LUMBAR DISC ASPIRATION W/IMG GUIDE  01/07/2024   IR REMOVAL TUN CV CATH W/O FL  08/21/2016   IR REMOVAL TUN CV CATH W/O FL  10/12/2023   IR US  GUIDE VASC ACCESS LEFT  04/17/2023   IR US  GUIDE VASC  ACCESS RIGHT  10/14/2023   PERIPHERAL VASCULAR CATHETERIZATION Left 05/09/2016   Procedure: A/V Fistulagram;  Surgeon: Lonni GORMAN Blade, MD;  Location: [facility] INVASIVE CV LAB;  Service: Cardiovascular;  Laterality: Left;  arm   TEE WITHOUT CARDIOVERSION N/A 06/22/2018   Procedure: TRANSESOPHAGEAL ECHOCARDIOGRAM (TEE);  Surgeon: Jeffrie Oneil BROCKS, MD;  Location: [facility] ENDOSCOPY;  Service: Cardiovascular;  Laterality: N/A;   TRANSESOPHAGEAL ECHOCARDIOGRAM (CATH LAB) N/A 10/15/2023   Procedure: TRANSESOPHAGEAL ECHOCARDIOGRAM;  Surgeon: Rolan Ezra GORMAN, MD;  Location: [facility] INVASIVE CV LAB;  Service: Cardiovascular;  Laterality: N/A;     Social History:   reports that he has never smoked. He has never used smokeless tobacco. He reports that he does not drink alcohol  and does not use drugs.   Family History:  His family history includes Diabetes in his father and mother.   Allergies No Known Allergies   Home Medications  Prior to Admission medications   Medication Sig Start Date End Date Taking? Authorizing Provider  bictegravir-emtricitabine -tenofovir  AF (BIKTARVY ) 50-200-25 MG TABS tablet Take 1 tablet by mouth daily. 04/16/23  Yes Fleeta Rothman, Jomarie SAILOR, MD  insulin  aspart protamine  - aspart (NOVOLOG  70/30 FLEXPEN) (70-30) 100 UNIT/ML FlexPen Inject 20 Units into the skin 2 (two) times daily. Patient taking differently: Inject 10 Units into the skin 2 (two) times daily. 07/17/23  Yes Tanda Bleacher, MD  ZYPITAMAG  4 MG TABS Take by mouth. 11/26/23  Yes [provider]  gabapentin  (NEURONTIN ) 100 MG  capsule Take 1 capsule (100 mg total) by mouth at bedtime. Patient not taking: Reported on 12/16/2023 10/17/23 01/15/24  [place], Eric J, DO  HYDROcodone -acetaminophen  (NORCO/VICODIN) 5-325 MG tablet Take 1 tablet by mouth every 6 (six) hours as needed for severe pain (pain score 7-10) or moderate pain (pain score 4-6). Patient not taking: Reported on 12/15/2023 10/17/23   [place], Camellia PARAS, DO  midodrine  (PROAMATINE ) 10 MG tablet Take 1 tablet (10 mg total) by mouth 3 (three) times daily as needed for low blood pressure, SBP</=100. may take 30 min prior to dialysis and mid treatment as needed. Patient not taking: Reported on 12/16/2023 09/05/23     bictegravir-emtricitabine -tenofovir  AF (BIKTARVY ) 50-200-25 MG TABS tablet Take 1 tablet by mouth daily. 01/02/22   Fleeta Rothman Jomarie SAILOR, MD     Due to a high probability of clinically significant, life threatening deterioration, the patient required my highest level of preparedness to intervene emergently and I personally spent this critical care time directly and personally managing the patient. This critical care time included obtaining a history; examining the patient; pulse oximetry; ordering and review of studies; arranging urgent treatment with development of a management plan; evaluation of patient's response to treatment; frequent reassessment; and, discussions with other providers.  This critical care time was performed to assess and manage the high probability of imminent, life-threatening deterioration that could result in multi-organ failure. It was exclusive of separately billable procedures and treating other patients and teaching time. Critical Care Time: 35 minutes.  Paula Southerly, MD [facility]

## 2024-02-26 NOTE — Progress Notes (Signed)
 Daily Progress Note   Patient Name: Alvin Daniels       Date: 02/26/2024 DOB: March 19, 1971  Age: 53 y.o. MRN#: [id number] Attending Physician: Alvin Cools, MD Primary Care Physician: Pcp, No Admit Date: 12/14/2023  Reason for Consultation/Follow-up: Establishing goals of care  Subjective: Medical records reviewed including progress notes, labs, imaging. Patient assessed at the bedside. He remains intubated and sedated. I was joined by interpreter, MD, RN, and patient's 4 brothers for scheduled family meeting. Later joined by 2 friends as well.  Provided medical update regarding patient's prognosis and rationale in the context of severe sepsis and multisystem organ failure with lack of further options to prevent worsening of infection and suffering.  Created space and opportunity for family's thoughts and feelings on patient's current illness.  2 of patient's brothers are in agreement with focus on comfort, peace, and dignity and relieving patient from interventions such as mechanical ventilation, CRRT, and pressors.  The other 2 brothers are more inclined to give patient a little more time before making the decision with the hope that he might be more cognizant.  MD provided update that he is moving extremities while sedation is weaned, though not responsive and interactive.  Expectation is that this would remain the case.  Decision was made to continue with current care plan for another 48 hours and then reconvene at that time for likely transition to comfort focused care if no meaningful improvement.  Emotional support and therapeutic listening was provided.  Questions and concerns addressed. PMT will continue to support holistically.   Length of Stay: 71   Physical Exam Vitals and nursing note reviewed.  Constitutional:       Appearance: He is cachectic. He is ill-appearing.     Interventions: He is intubated.  HENT:     Head: Normocephalic and atraumatic.  Cardiovascular:     Rate and Rhythm: Normal rate.  Pulmonary:     Effort: He is intubated.  Skin:    General: Skin is warm and dry.  Neurological:     Comments: Sedated            Vital Signs: BP 132/63   Pulse 91   Temp 97.9 F (36.6 C)   Resp 20   Ht 5' 2 (1.575 m)   Wt 40.8 kg   SpO2 100%   BMI 16.45 kg/m  SpO2: SpO2: 100 % O2 Device: O2 Device: Ventilator O2 Flow Rate: O2 Flow Rate (L/min): 40 L/min      Palliative Assessment/Data: 10%   Palliative Care Assessment & Plan   Patient Profile: Palliative Care consult requested for goals  of care discussion in this 53 y.o. 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  He was admitted on 12/14/2023 from home with inability to walk due to uncontrolled pain.  During workup MRI of sacrum showed discitis and osteomyelitis of L4-5 with associated paraspinal inflammatory changes and right psoas mass.  Patient started on IV vancomycin  and cefepime .   Assessment: Goals of care conversation End-stage renal disease on CRRT Acute hypoxic respiratory failure with concern for ARDS, improving after proning Septic shock MRSA psoas abscess and discitis PAD s/p bilateral BKA's  Recommendations/Plan: Continue DNR Continue current care plan for another 48 hours to allow brothers time to process information received today Patient's brothers understand that demise is ultimately expected. Likely compassionate extubation and comfort care on Saturday 10/18 PMT will continue to follow and support  Prognosis: Very poor  Discharge  Planning: Anticipated Hospital Death  Care plan was discussed with patient's brothers, MD, RN         Alvin Daniels Alvin Fell, PA-C  Palliative Medicine Team Team phone # [phone]  Thank you for allowing the Palliative Medicine Team to assist in the care of this patient. Please utilize secure chat with additional questions, if there is no response within 30 minutes please call the above phone number.  Palliative Medicine Team providers are available by phone from 7am to 7pm daily and can be reached through the team cell phone.  Should this patient require assistance outside of these hours, please call the patient's attending physician.     Time Total: 50  Visit consisted of counseling and education dealing with the complex and emotionally intense issues of symptom management and palliative care in the setting of serious and potentially life-threatening illness. Greater than 50% of this time was spent counseling and coordinating care related to the above assessment and plan.  Personally spent 50 minutes in patient care including extensive chart review (labs, imaging, progress/consult notes, vital signs), medically appropraite exam, discussed with treatment team, education to patient, family, and staff, documenting clinical information, medication review and management, coordination of care, and available advanced directive documents.

## 2024-02-27 LAB — RENAL FUNCTION PANEL
Albumin: 2.1 g/dL — ABNORMAL LOW (ref 3.5–5.0)
Albumin: 2.3 g/dL — ABNORMAL LOW (ref 3.5–5.0)
Anion gap: 9 (ref 5–15)
Anion gap: 9 (ref 5–15)
BUN: 14 mg/dL (ref 6–20)
BUN: 19 mg/dL (ref 6–20)
CO2: 23 mmol/L (ref 22–32)
CO2: 24 mmol/L (ref 22–32)
Calcium: 8.3 mg/dL — ABNORMAL LOW (ref 8.9–10.3)
Calcium: 8.1 mg/dL — ABNORMAL LOW (ref 8.9–10.3)
Chloride: 101 mmol/L (ref 98–111)
Chloride: 101 mmol/L (ref 98–111)
Creatinine, Ser: 0.41 mg/dL — ABNORMAL LOW (ref 0.61–1.24)
Creatinine, Ser: 0.51 mg/dL — ABNORMAL LOW (ref 0.61–1.24)
GFR, Estimated: >60 mL/min (ref >60)
GFR, Estimated: >60 mL/min (ref >60)
Glucose, Bld: 86 mg/dL (ref 70–99)
Glucose, Bld: 162 mg/dL — ABNORMAL HIGH (ref 70–99)
Phosphorus: 2.1 mg/dL — ABNORMAL LOW (ref 2.5–4.6)
Phosphorus: 2 mg/dL — ABNORMAL LOW (ref 2.5–4.6)
Potassium: 3.9 mmol/L (ref 3.5–5.1)
Potassium: 4 mmol/L (ref 3.5–5.1)
Sodium: 133 mmol/L — ABNORMAL LOW (ref 135–145)
Sodium: 134 mmol/L — ABNORMAL LOW (ref 135–145)

## 2024-02-27 LAB — GLUCOSE, CAPILLARY
Glucose-Capillary: 70 mg/dL (ref 70–99)
Glucose-Capillary: 49 mg/dL — ABNORMAL LOW (ref 70–99)
Glucose-Capillary: 182 mg/dL — ABNORMAL HIGH (ref 70–99)
Glucose-Capillary: 161 mg/dL — ABNORMAL HIGH (ref 70–99)
Glucose-Capillary: 108 mg/dL — ABNORMAL HIGH (ref 70–99)

## 2024-02-27 LAB — CBC
HCT: 27.6 % — ABNORMAL LOW (ref 39.0–52.0)
Hemoglobin: 9.2 g/dL — ABNORMAL LOW (ref 13.0–17.0)
MCH: 32.1 pg (ref 26.0–34.0)
MCHC: 33.3 g/dL (ref 30.0–36.0)
MCV: 96.2 fL (ref 80.0–100.0)
Platelets: 40 K/uL — ABNORMAL LOW (ref 150–400)
RBC: 2.87 MIL/uL — ABNORMAL LOW (ref 4.22–5.81)
RDW: 19.9 % — ABNORMAL HIGH (ref 11.5–15.5)
WBC: 5 K/uL (ref 4.0–10.5)
nRBC: 0.8 % — ABNORMAL HIGH (ref 0.0–0.2)

## 2024-02-27 LAB — MAGNESIUM: Magnesium: 2.3 mg/dL (ref 1.7–2.4)

## 2024-02-27 LAB — TRIGLYCERIDES: Triglycerides: 32 mg/dL (ref <150)

## 2024-02-27 MED ORDER — VASOPRESSIN 20 UNITS/100 ML INFUSION FOR SHOCK
0.0000 [IU]/min | INTRAVENOUS | Status: DC
  Administered 2024-02-27 – 2024-03-01 (×2): 0.03 [IU]/min via INTRAVENOUS
  Filled 2024-02-27 (×2): qty 100

## 2024-02-27 MED ORDER — ALBUMIN HUMAN 25 % IV SOLN
25.0000 g | Freq: Once | INTRAVENOUS | Status: AC
  Administered 2024-02-27: 25 g via INTRAVENOUS
  Filled 2024-02-27: qty 100

## 2024-02-27 MED ORDER — SODIUM PHOSPHATES 45 MMOLE/15ML IV SOLN
15.0000 mmol | Freq: Once | INTRAVENOUS | Status: AC
  Administered 2024-02-27: 15 mmol via INTRAVENOUS
  Filled 2024-02-27: qty 5

## 2024-02-27 NOTE — Progress Notes (Signed)
 [facility] Progress Note  Subjective:  I/O net negative 3.1 L yest, 1.7 today BP's dropping and pressors ^'d overnight  Vitals:   02/27/24 1000 02/27/24 1015 02/27/24 1030 02/27/24 1045  BP:      Pulse: (!) 117 (!) 120    Resp: 19 15 18 19   Temp: 97.7 F (36.5 C) 98.4 F (36.9 C) 98.1 F (36.7 C) 97.9 F (36.6 C)  TempSrc: Esophageal     SpO2: 97% 100%    Weight:      Height:        Exam: General: Chronically ill, frail, cachectic, on vent Heart: RRR Lungs: CTAB Abdomen: soft Extremities: bilat hip better, 1+ remains, B BKA Dialysis Access: TDC intact    OP HD: [facility] - MWF 3.5h  B400  45.5kg  TDC  Heparin  2000 Mircera 100 mcg - last dose 12/01/23 Calcitriol  1.0 mcg (on hold due to high Ca) Uses midodrine  10mg  TID + extra pre-HD    Assessment/ Plan: Shock: due to sepsis. 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 Had a long course of IV Vancomycin , however, repeat MRI 10/8 showed the infection has progressed. Per NSGY not a surgical candidate.  Is back on IV vanc/ zosyn  now.  Volume overload/ pulm edema: CXR 10/11 and 11/12 showed severe worsening pulm edema. CXR 11/14 showed sig improvement after vol removal. 3.1 L net neg yesterday again, but BP's dropped this am and pressors sig increased, which usually means we need to back off on pulling fluid. Have changed to keep even, will consider + 50cc/hr.  ESRD: HD TTS though this admit.  Acute decompensation causing need for CRRT started 10/12. Continue.  BP: Chronic hypotension on mido 10mg  TID and levo / vaso gtt's this am.   Anemia of ESRD: Hgb 9.9 - continue Aranesp  150mcg q Friday. No IV iron  d/t infection. BMD: CorrCa high, VDRA on hold. Phos on goal, on sevelamer  as binder.    Alvin Fret MD  [facility] 02/27/2024, 11:11 AM  Recent Labs  Lab 02/26/24 0541 02/26/24 1552 02/27/24 0510  HGB 8.6*  --  9.2*  ALBUMIN  2.1* 2.0* 2.1*   CALCIUM  8.3* 8.1* 8.3*  PHOS  --  1.7* 2.1*  CREATININE 0.47* 0.70 0.41*  K 4.1 4.2 3.9   No results for input(s): IRON , TIBC, FERRITIN in the last 168 hours. Inpatient medications:  acetaminophen   1,000 mg Per Tube TID   acyclovir ointment  1 Application Topical 6 X Daily   Chlorhexidine  Gluconate Cloth  6 each Topical Daily   darbepoetin (ARANESP ) injection - DIALYSIS  150 mcg Subcutaneous Q Fri-1800   [START ON 02/28/2024] dexamethasone  (DECADRON ) injection  10 mg Intravenous Q24H   docusate  100 mg Per Tube BID   dolutegravir   50 mg Per Tube Daily   emtricitabine -tenofovir  AF  1 tablet Per Tube Daily   feeding supplement (PROSource TF20)  60 mL Per Tube Daily   gabapentin   100 mg Per Tube Q8H   gatifloxacin   1 drop Right Eye BID   insulin  aspart  0-5 Units Subcutaneous QHS   insulin  aspart  0-6 Units Subcutaneous TID WC   methocarbamol   500 mg Per Tube TID   midodrine   10 mg Per Tube TID WC   multivitamin  1 tablet Per Tube QHS   mouth rinse  15 mL Mouth Rinse Q2H   pantoprazole  (PROTONIX ) IV  40 mg Intravenous Q12H   polyethylene glycol  17  g Per Tube Daily   sodium chloride  flush  10-40 mL Intracatheter Q12H    feeding supplement (OSMOLITE 1.5 CAL) 15 mL/hr at 02/27/24 1100   fentaNYL  infusion INTRAVENOUS 100 mcg/hr (02/27/24 1100)   norepinephrine  (LEVOPHED ) Adult infusion 14 mcg/min (02/27/24 1100)   piperacillin -tazobactam Stopped (02/27/24 0938)   prismasol BGK 4/2.5 400 mL/hr at 02/27/24 0907   prismasol BGK 4/2.5 1,500 mL/hr at 02/27/24 0907   prismasol BGK 4/2.5 400 mL/hr at 02/27/24 9093   propofol  (DIPRIVAN ) infusion 20 mcg/kg/min (02/27/24 1100)   sodium PHOSPHATE  IVPB (in mmol) 43 mL/hr at 02/27/24 1100   vancomycin  Stopped (02/26/24 1626)   vasopressin 0.03 Units/min (02/27/24 1100)   dextrose , fentaNYL , heparin , hydrALAZINE , ibuprofen , ipratropium-albuterol , lip balm, metoprolol  tartrate, ondansetron  (ZOFRAN ) IV, mouth rinse, oxyCODONE , sodium  chloride, sodium chloride  flush

## 2024-02-27 NOTE — Progress Notes (Signed)
 Pharmacy Electrolyte Replacement  Recent Labs:  Recent Labs    02/27/24 0510  K 3.9  MG 2.3  PHOS 2.1*  CREATININE 0.41*    Low Critical Values (K </= 2.5, Phos </= 1, Mg </= 1) Present: None  MD Contacted: n/a  Plan: NaPhos 15 mmol IV x1 per CRRT protocol   Rankin Sams, PharmD, BCPS, BCCCP Clinical Pharmacist

## 2024-02-27 NOTE — Progress Notes (Signed)
 eLink Physician-Brief Progress Note Patient Name: Alvin Daniels DOB: 12-29-1970 MRN: [id number]   Date of Service  02/27/2024  HPI/Events of Note  Patient with increasing pressor requirements and tachycardia (Levo at 24 mcg, heart rate 134) despite lightening sedation, serum albumin  is 2.0 gm / dl.  eICU Interventions  Albumin  25 % 25 gm iv x 1, and Vasopressin gtt ordered.        Kehinde Totzke U Jameika Kinn 02/27/2024, 5:32 AM

## 2024-02-27 NOTE — Progress Notes (Signed)
 NAMEJaimere Daniels, MRN:  [id number], DOB:  03-07-1971, LOS: 74 ADMISSION DATE:  12/14/2023, CONSULTATION DATE:  02/24/2024 REFERRING MD:  MELODIE, CHIEF COMPLAINT:  septic shock, respiratory failure   History of Present Illness:   Alvin Daniels is a 53 y.o. male with medical history significant of ESRD on HD MWF, type 2 diabetes mellitus poorly controlled, HIV, chronic hepatitis C, PAD s/p bilateral BKA, and recent admission for MRSA septicemia, possible IE, and L middle finger OM s/p amputation per Ortho surgery on 5/29 (cultures grew E. 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   Pt was in USOH until this past week when he had difficulty walking due to the pain in his R hip. The pain would bother him all day, but he was able to walk most of the day and rest in the evening. Yesterday, the pain was so bad that he was unable to walk at all, so he presented to the ED for further evaluation. Of note, pt was recently admitted for MRSA septicemia and possible IE and was continued on IV vancomycin  w/ HD until 7/19, and d/c recommended repeat TEE to eval for clearance of IE.   In the ED, pt AFVSS. Labs showed K 3.4, Cr 9.06, CRP 15.2, and ESR 133. MRI sacrum showed discitis and osteomyelitis at L4-5 with associated paraspinal inflammatory changes and right psoas Abscess. EDP ordered IV vancomycin /cefepime , and requested medicine admission.    Patient was brought into ICU because of increasing vasopressor needs.  He was eventually intubated.  Pertinent  Medical History  ESRD DM HIV Chronic Hep C PAD s/p bilateral BKA  Significant Hospital Events: Including procedures, antibiotic start and stop dates in addition to other pertinent events   02/21/2024 intubated for respiratory failure and vasopressor therapy was increasing during the day because of septic shock.  02/23/2024: proned at 10:30 and supinated  at 3 AM 02/24/2024: remains supinated, did well overnight. 02/25/2024: had family meeting with brother, heading to [place], interested in moving his body to hometown 02/26/2024: had a family meeting with 4 of his siblings and they decided to do a time limited trial of 48 hours. Will need to circle back with them on Saturday 02/28/24.  Interim History / Subjective:  Patient remains intubated and on invasive mechanical ventilation Vasopressors: levophed  16, vasopressin 0.03 Sedation: Propofol  20, Fentanyl  100 MV: PRVC 380/+8/40% I/O - 2.9 L (overall - 1.8 L)  Objective    Blood pressure 132/63, pulse (!) 125, temperature 98.1 F (36.7 C), resp. rate 18, height 5' 2 (1.575 m), weight 39 kg, SpO2 100%.    Vent Mode: PRVC FiO2 (%):  [40 %] 40 % Set Rate:  [20 bmp] 20 bmp Vt Set:  [380 mL] 380 mL PEEP:  [8 cmH20] 8 cmH20 Plateau Pressure:  [14 cmH20-20 cmH20] 20 cmH20   Intake/Output Summary (Last 24 hours) at 02/27/2024 0814 Last data filed at 02/27/2024 0700 Gross per 24 hour  Intake 1872.42 ml  Output 4711 ml  Net -2838.58 ml   Filed Weights   02/25/24 0434 02/26/24 0500 02/27/24 0314  Weight: 44.1 kg 40.8 kg 39 kg    Examination: General: intubated, sedated, cachectic, chronically ill appearing Eyes: eyes sucked in and glazed, pupils sluggish to light ENMT: oropharynx with bleeding in mouth that appears to be old Neck: JVD flat CV: tachycardic, no murmurs Resp: rhonchorous breath sounds bilaterally Abdom: w/ bruising in lower abdomen  Extremities: bilateral BKAs, edema improved Neuro: not alert, not responsive, moves non-purposefully  Resolved problem list   Assessment and Plan   #Septic Shock: likely septic in the setting of psoas abscess and discitis; shock is worsening. - Levophed  - Vasopressin - Steroids per below - Stop pulling on CRRT - Continue Midodrine  10 mg TID - Target MAP > 65 mmHg  #Acute Hypoxic Respiratory Failure: concern for ARDS now s/p 1 session  of proning with improved ventilator requirements. - Prevent lung injury - Low tidal volume ventilation - Aim for Pplat < 30 and DP < 15 - Lowest FiO2 to keep SPO2 > 90% and PaO2 > 65 mmHg - Permissive hypercapnia - pH goal > 7.2 - Daily SAT/SBT - s/p proning on 10/13 and improved dramatically. - DEXA ARDS steroids - Hold of UF with CRRT, keep net even.  #MRSA Discitis/Psoas Abscess: - Continue Vancomycin , no end date for now given that there's no surgical intervention at this time.  # Abundant GNR growing in trach aspirate: - Continue Zosyn  (end date: 02/27/2024)  #Stress Ulcer PPx: PPI  #Nutrition: advance diet as tolerated  #ESRD: - Continue CRRT - Renally adjust medications - Avoid Nephrotoxins - Strict I/Os - Foley catheter  #VTE ppx: holding heparin  subQ due to thrombocytopenia  #Thrombocytopenia: likely DIC and potentially some contribution from CRRT. - Continue to monitor PLT, goal > 10 K - Transfuse PLT to meet goal  #Hyperglycemia: - Sensitive SSI  #PADs: - Propofo; - Fentanyl   #GOC: will plan for family meeting on 02/28/24 to decide on long term GOC. Suspect patient's family will decide to make him comfortable.  Disposition: ICU appropriate for respiratory failure and shock state.  Labs   CBC: Recent Labs  Lab 02/22/24 1649 02/23/24 0120 02/24/24 1816 02/25/24 0300 02/25/24 0842 02/26/24 0541 02/27/24 0510  WBC 7.0   < > 6.0 6.2 6.9 6.8 5.0  NEUTROABS 6.2  --  5.9  --   --  5.6  --   HGB 6.7*   < > 8.2* 7.8* 7.5* 8.6* 9.2*  HCT 20.2*   < > 23.9* 23.5* 22.3* 25.6* 27.6*  MCV 98.1   < > 93.7 94.8 94.1 95.5 96.2  PLT 33*   < > 37* 32* 43* 42* 40*   < > = values in this interval not displayed.    Basic Metabolic Panel: Recent Labs  Lab 02/23/24 0120 02/23/24 0905 02/24/24 0454 02/24/24 0834 02/24/24 1600 02/25/24 0300 02/25/24 1531 02/26/24 0541 02/26/24 1552 02/27/24 0510  NA 127*   < > 131*   < > 131* 132* 132* 133* 135 133*  K  3.4*   < > 4.4   < > 4.3 4.0 4.2 4.1 4.2 3.9  CL 97*   < > 98  --  98 100 97* 101 101 101  CO2 21*   < > 20*  --  24 23 23 23 24 23   GLUCOSE 104*   < > 183*  --  156* 160* 158* 93 165* 86  BUN 22*   < > 8  --  8 12 17 16 20 14   CREATININE 1.70*   < > 0.86  --  0.69 0.63 0.54* 0.47* 0.70 0.41*  CALCIUM  7.7*   < > 7.6*  --  7.9* 8.0* 8.1* 8.3* 8.1* 8.3*  MG 1.8  --  2.1  --   --  2.2  --  2.5*  --  2.3  PHOS 1.8*   < > 2.6  --  2.5 2.1* 2.8  --  1.7* 2.1*   < > = values in this interval not displayed.   GFR: Estimated Creatinine Clearance: 58.9 mL/min (A) (by C-G formula based on SCr of 0.41 mg/dL (L)). Recent Labs  Lab 02/21/24 0813 02/22/24 1649 02/25/24 0300 02/25/24 0842 02/26/24 0541 02/27/24 0510  WBC  --    < > 6.2 6.9 6.8 5.0  LATICACIDVEN 1.3  --   --   --   --   --    < > = values in this interval not displayed.    Liver Function Tests: Recent Labs  Lab 02/25/24 0300 02/25/24 1531 02/26/24 0541 02/26/24 1552 02/27/24 0510  AST  --   --  24  --   --   ALT  --   --  19  --   --   ALKPHOS  --   --  144*  --   --   BILITOT  --   --  1.3*  --   --   PROT  --   --  7.4  --   --   ALBUMIN  1.9* 1.9* 2.1* 2.0* 2.1*   No results for input(s): LIPASE, AMYLASE in the last 168 hours. No results for input(s): AMMONIA in the last 168 hours.  ABG    Component Value Date/Time   PHART 7.438 02/24/2024 0834   PCO2ART 33.4 02/24/2024 0834   PO2ART 119 (H) 02/24/2024 0834   HCO3 22.7 02/24/2024 0834   TCO2 24 02/24/2024 0834   ACIDBASEDEF 1.0 02/24/2024 0834   O2SAT 99 02/24/2024 0834     Coagulation Profile: Recent Labs  Lab 02/23/24 0940  INR 1.9*    Cardiac Enzymes: No results for input(s): CKTOTAL, CKMB, CKMBINDEX, TROPONINI in the last 168 hours.   HbA1C: Hemoglobin A1C  Date/Time Value Ref Range Status  07/17/2023 04:28 PM 10.0 (A) 4.0 - 5.6 % Final  01/29/2023 03:22 PM 8.5 (A) 4.0 - 5.6 % Final   HbA1c, POC (controlled diabetic range)   Date/Time Value Ref Range Status  09/28/2018 02:21 PM 6.8 0.0 - 7.0 % Final   Hgb A1c MFr Bld  Date/Time Value Ref Range Status  01/28/2024 04:00 AM 5.9 (H) 4.8 - 5.6 % Final    Comment:    (NOTE) Diagnosis of Diabetes The following HbA1c ranges recommended by the American Diabetes Association (ADA) may be used as an aid in the diagnosis of diabetes mellitus.  Hemoglobin             Suggested A1C NGSP%              Diagnosis  <5.7                   Non Diabetic  5.7-6.4                Pre-Diabetic  >6.4                   Diabetic  <7.0                   Glycemic control for                       adults with diabetes.    08/01/2022 11:53 PM 10.7 (H) 4.8 - 5.6 % Final    Comment:    (NOTE)         Prediabetes: 5.7 - 6.4  Diabetes: >6.4         Glycemic control for adults with diabetes: <7.0     CBG: Recent Labs  Lab 02/26/24 0755 02/26/24 1143 02/26/24 1519 02/26/24 2123 02/27/24 0734  GLUCAP 88 148* 152* 110* 70    Review of Systems:   Not obtained  Past Medical History:  He,  has a past medical history of Acute bilateral low back pain (01/03/2021), AIDS ([name]) (11/22/2014), Back pain (06/09/2019), BKA stump complication ([name]) (06/09/2019), Chronic diarrhea, Chronic hepatitis C without hepatic coma ([name]) (11/22/2014), 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[name]), Corneal ulcer (12/18/2022), 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   Surgical History:   Past Surgical History:  Procedure Laterality Date   AMPUTATION Left  04/20/2014   Procedure: 3rd toe amputation, 4th Toe Amputation,  5th Toe Amputation;  Surgeon: Jerona Harden GAILS, MD;  Location: [facility] OR;  Service: Orthopedics;  Laterality: Left;   AMPUTATION Left 05/02/2014   Procedure: Midfoot Amputation;  Surgeon: Jerona Harden GAILS, MD;  Location: [facility] OR;  Service: Orthopedics;  Laterality: Left;   AMPUTATION Left 06/17/2014   Procedure: AMPUTATION BELOW KNEE;  Surgeon: Jerona Harden GAILS, MD;  Location: [facility] OR;  Service: Orthopedics;  Laterality: Left;   AMPUTATION Right 09/04/2018   Procedure: RIGHT BELOW KNEE AMPUTATION;  Surgeon: Harden Jerona GAILS, MD;  Location: [facility] OR;  Service: Orthopedics;  Laterality: Right;  RIGHT BELOW KNEE AMPUTATION   AMPUTATION FINGER Left 10/09/2023   Procedure: AMPUTATION, FINGER;  Surgeon: Romona Harari, MD;  Location: [facility] OR;  Service: Orthopedics;  Laterality: Left;  Left Ring Finger amputation   AV FISTULA PLACEMENT Left    AV FISTULA PLACEMENT Left 05/10/2016   Procedure: Creation Left Arm Brachiocephalic Arteriovenous Fistula and Ligation of Radiocephalic Fistula;  Surgeon: Lonni GORMAN Blade, MD;  Location: [facility] OR;  Service: Vascular;  Laterality: Left;   DIALYSIS/PERMA CATHETER INSERTION N/A 09/01/2023   Procedure: DIALYSIS/PERMA CATHETER INSERTION;  Surgeon: Melia Lynwood ORN, MD;  Location: [facility] INVASIVE CV LAB;  Service: Cardiovascular;  Laterality: N/A;   FEMUR IM NAIL Left 08/17/2016   Procedure: INTRAMEDULLARY (IM) RETROGRADE FEMORAL NAILING;  Surgeon: Oneil JAYSON Herald, MD;  Location: [facility] OR;  Service: Orthopedics;  Laterality: Left;   FOOT AMPUTATION THROUGH ANKLE Left 12/'21/2015   midfoot   IR AV DIALY SHUNT INTRO NEEDLE/INTRACATH INITIAL W/PTA/IMG LEFT  04/17/2023   IR FLUORO GUIDE CV LINE RIGHT  10/14/2023   IR FLUORO GUIDE CV LINE RIGHT  11/11/2023   IR GENERIC HISTORICAL Left 05/01/2016   IR THROMBECTOMY AV FISTULA W/THROMBOLYSIS/PTA INC/SHUNT/IMG LEFT 05/01/2016 Toribio Faes, MD [facility]   IR GENERIC HISTORICAL  05/01/2016   IR US   GUIDE VASC ACCESS LEFT 05/01/2016 Toribio Faes, MD [facility]   IR GENERIC HISTORICAL  05/07/2016   IR FLUORO GUIDE CV LINE RIGHT 05/07/2016 Ami Bellman, DO [facility]   IR GENERIC HISTORICAL  05/07/2016   IR US  GUIDE VASC ACCESS RIGHT 05/07/2016 Ami Bellman, DO [facility]   IR GENERIC HISTORICAL  05/22/2016   IR US  GUIDE VASC ACCESS RIGHT 05/22/2016 Ozell Specking, MD [facility]   IR GENERIC HISTORICAL  05/22/2016   IR FLUORO GUIDE CV LINE RIGHT 05/22/2016 Ozell Specking, MD [facility]  IR LUMBAR DISC ASPIRATION W/IMG GUIDE  01/07/2024   IR REMOVAL TUN CV CATH W/O FL  08/21/2016   IR REMOVAL TUN CV CATH W/O FL  10/12/2023   IR US  GUIDE VASC ACCESS LEFT  04/17/2023   IR US  GUIDE VASC ACCESS RIGHT  10/14/2023   PERIPHERAL VASCULAR CATHETERIZATION Left 05/09/2016   Procedure: A/V Fistulagram;  Surgeon: Lonni GORMAN Blade, MD;  Location: [facility] INVASIVE CV LAB;  Service: Cardiovascular;  Laterality: Left;  arm   TEE WITHOUT CARDIOVERSION N/A 06/22/2018   Procedure: TRANSESOPHAGEAL ECHOCARDIOGRAM (TEE);  Surgeon: Jeffrie Oneil BROCKS, MD;  Location: [facility] ENDOSCOPY;  Service: Cardiovascular;  Laterality: N/A;   TRANSESOPHAGEAL ECHOCARDIOGRAM (CATH LAB) N/A 10/15/2023   Procedure: TRANSESOPHAGEAL ECHOCARDIOGRAM;  Surgeon: Rolan Ezra GORMAN, MD;  Location: [facility] INVASIVE CV LAB;  Service: Cardiovascular;  Laterality: N/A;     Social History:   reports that he has never smoked. He has never used smokeless tobacco. He reports that he does not drink alcohol  and does not use drugs.   Family History:  His family history includes Diabetes in his father and mother.   Allergies No Known Allergies   Home Medications  Prior to Admission medications   Medication Sig Start Date End Date Taking? Authorizing Provider  bictegravir-emtricitabine -tenofovir  AF (BIKTARVY ) 50-200-25 MG TABS tablet Take 1 tablet by mouth daily. 04/16/23  Yes Fleeta Rothman, Jomarie SAILOR, MD  insulin  aspart protamine  - aspart (NOVOLOG  70/30  FLEXPEN) (70-30) 100 UNIT/ML FlexPen Inject 20 Units into the skin 2 (two) times daily. Patient taking differently: Inject 10 Units into the skin 2 (two) times daily. 07/17/23  Yes Tanda Bleacher, MD  ZYPITAMAG  4 MG TABS Take by mouth. 11/26/23  Yes [provider]  gabapentin  (NEURONTIN ) 100 MG capsule Take 1 capsule (100 mg total) by mouth at bedtime. Patient not taking: Reported on 12/16/2023 10/17/23 01/15/24  [place], Camellia PARAS, DO  HYDROcodone -acetaminophen  (NORCO/VICODIN) 5-325 MG tablet Take 1 tablet by mouth every 6 (six) hours as needed for severe pain (pain score 7-10) or moderate pain (pain score 4-6). Patient not taking: Reported on 12/15/2023 10/17/23   [place], Camellia PARAS, DO  midodrine  (PROAMATINE ) 10 MG tablet Take 1 tablet (10 mg total) by mouth 3 (three) times daily as needed for low blood pressure, SBP</=100. may take 30 min prior to dialysis and mid treatment as needed. Patient not taking: Reported on 12/16/2023 09/05/23     bictegravir-emtricitabine -tenofovir  AF (BIKTARVY ) 50-200-25 MG TABS tablet Take 1 tablet by mouth daily. 01/02/22   Fleeta Rothman Jomarie SAILOR, MD     Due to a high probability of clinically significant, life threatening deterioration, the patient required my highest level of preparedness to intervene emergently and I personally spent this critical care time directly and personally managing the patient. This critical care time included obtaining a history; examining the patient; pulse oximetry; ordering and review of studies; arranging urgent treatment with development of a management plan; evaluation of patient's response to treatment; frequent reassessment; and, discussions with other providers.  This critical care time was performed to assess and manage the high probability of imminent, life-threatening deterioration that could result in multi-organ failure. It was exclusive of separately billable procedures and treating other patients and teaching time. Critical Care Time: 35  minutes.  Paula Southerly, MD [facility]

## 2024-02-28 ENCOUNTER — Other Ambulatory Visit: Payer: Self-pay

## 2024-02-28 DIAGNOSIS — Z66 Do not resuscitate: Secondary | ICD-10-CM

## 2024-02-28 DIAGNOSIS — R739 Hyperglycemia, unspecified: Secondary | ICD-10-CM

## 2024-02-28 DIAGNOSIS — A419 Sepsis, unspecified organism: Secondary | ICD-10-CM

## 2024-02-28 DIAGNOSIS — R6521 Severe sepsis with septic shock: Secondary | ICD-10-CM

## 2024-02-28 LAB — CBC WITH DIFFERENTIAL/PLATELET
Abs Immature Granulocytes: 0.19 K/uL — ABNORMAL HIGH (ref 0.00–0.07)
Basophils Absolute: 0.1 K/uL (ref 0.0–0.1)
Basophils Relative: 1 %
Eosinophils Absolute: 0 K/uL (ref 0.0–0.5)
Eosinophils Relative: 0 %
HCT: 24.2 % — ABNORMAL LOW (ref 39.0–52.0)
Hemoglobin: 7.8 g/dL — ABNORMAL LOW (ref 13.0–17.0)
Immature Granulocytes: 2 %
Lymphocytes Relative: 7 %
Lymphs Abs: 0.7 K/uL (ref 0.7–4.0)
MCH: 31.5 pg (ref 26.0–34.0)
MCHC: 32.2 g/dL (ref 30.0–36.0)
MCV: 97.6 fL (ref 80.0–100.0)
Monocytes Absolute: 0.1 K/uL (ref 0.1–1.0)
Monocytes Relative: 1 %
Neutro Abs: 8.6 K/uL — ABNORMAL HIGH (ref 1.7–7.7)
Neutrophils Relative %: 89 %
Platelets: 17 K/uL — CL (ref 150–400)
RBC: 2.48 MIL/uL — ABNORMAL LOW (ref 4.22–5.81)
RDW: 19.8 % — ABNORMAL HIGH (ref 11.5–15.5)
WBC: 9.6 K/uL (ref 4.0–10.5)
nRBC: 0.5 % — ABNORMAL HIGH (ref 0.0–0.2)

## 2024-02-28 LAB — RENAL FUNCTION PANEL
Albumin: 2 g/dL — ABNORMAL LOW (ref 3.5–5.0)
Albumin: 2 g/dL — ABNORMAL LOW (ref 3.5–5.0)
Anion gap: 9 (ref 5–15)
Anion gap: 10 (ref 5–15)
BUN: 16 mg/dL (ref 6–20)
BUN: 20 mg/dL (ref 6–20)
CO2: 25 mmol/L (ref 22–32)
CO2: 24 mmol/L (ref 22–32)
Calcium: 8.4 mg/dL — ABNORMAL LOW (ref 8.9–10.3)
Calcium: 8 mg/dL — ABNORMAL LOW (ref 8.9–10.3)
Chloride: 102 mmol/L (ref 98–111)
Chloride: 99 mmol/L (ref 98–111)
Creatinine, Ser: 0.47 mg/dL — ABNORMAL LOW (ref 0.61–1.24)
Creatinine, Ser: 0.45 mg/dL — ABNORMAL LOW (ref 0.61–1.24)
GFR, Estimated: >60 mL/min (ref >60)
GFR, Estimated: >60 mL/min (ref >60)
Glucose, Bld: 114 mg/dL — ABNORMAL HIGH (ref 70–99)
Glucose, Bld: 172 mg/dL — ABNORMAL HIGH (ref 70–99)
Phosphorus: 1.2 mg/dL — ABNORMAL LOW (ref 2.5–4.6)
Phosphorus: 3.8 mg/dL (ref 2.5–4.6)
Potassium: 4.1 mmol/L (ref 3.5–5.1)
Potassium: 4.4 mmol/L (ref 3.5–5.1)
Sodium: 136 mmol/L (ref 135–145)
Sodium: 133 mmol/L — ABNORMAL LOW (ref 135–145)

## 2024-02-28 LAB — CBC
HCT: 22.1 % — ABNORMAL LOW (ref 39.0–52.0)
Hemoglobin: 7.4 g/dL — ABNORMAL LOW (ref 13.0–17.0)
MCH: 32.6 pg (ref 26.0–34.0)
MCHC: 33.5 g/dL (ref 30.0–36.0)
MCV: 97.4 fL (ref 80.0–100.0)
Platelets: 17 K/uL — CL (ref 150–400)
RBC: 2.27 MIL/uL — ABNORMAL LOW (ref 4.22–5.81)
RDW: 19.9 % — ABNORMAL HIGH (ref 11.5–15.5)
WBC: 8.7 K/uL (ref 4.0–10.5)
nRBC: 0 % (ref 0.0–0.2)

## 2024-02-28 LAB — MAGNESIUM: Magnesium: 2.3 mg/dL (ref 1.7–2.4)

## 2024-02-28 LAB — GLUCOSE, CAPILLARY
Glucose-Capillary: 101 mg/dL — ABNORMAL HIGH (ref 70–99)
Glucose-Capillary: 138 mg/dL — ABNORMAL HIGH (ref 70–99)
Glucose-Capillary: 148 mg/dL — ABNORMAL HIGH (ref 70–99)
Glucose-Capillary: 149 mg/dL — ABNORMAL HIGH (ref 70–99)

## 2024-02-28 MED ORDER — SODIUM PHOSPHATES 45 MMOLE/15ML IV SOLN
30.0000 mmol | Freq: Once | INTRAVENOUS | Status: AC
  Administered 2024-02-28: 30 mmol via INTRAVENOUS
  Filled 2024-02-28: qty 10

## 2024-02-28 NOTE — Progress Notes (Signed)
 eLink Physician-Brief Progress Note Patient Name: Alvin Daniels DOB: 03/28/1971 MRN: [id number]   Date of Service  02/28/2024  HPI/Events of Note  Unexplained significant drop in hemoglobin and platelets on morning labs which were drawn from a femoral arterial line, no evidence of bleeding and stable hemodynamics.  eICU Interventions  CBC repeated to r/o sampling error.        Radha Coggins U Aldred Mase 02/28/2024, 5:48 AM

## 2024-02-28 NOTE — Plan of Care (Signed)
  Problem: Education: Goal: Knowledge of General Education information will improve Description: Including pain rating scale, medication(s)/side effects and non-pharmacologic comfort measures Outcome: Not Progressing   Problem: Health Behavior/Discharge Planning: Goal: Ability to manage health-related needs will improve Outcome: Not Progressing   Problem: Clinical Measurements: Goal: Ability to maintain clinical measurements within normal limits will improve Outcome: Not Progressing Goal: Will remain free from infection Outcome: Not Progressing Goal: Diagnostic test results will improve Outcome: Not Progressing Goal: Respiratory complications will improve Outcome: Not Progressing Goal: Cardiovascular complication will be avoided Outcome: Not Progressing   Problem: Activity: Goal: Risk for activity intolerance will decrease Outcome: Not Progressing   Problem: Nutrition: Goal: Adequate nutrition will be maintained Outcome: Not Progressing   Problem: Coping: Goal: Level of anxiety will decrease Outcome: Not Progressing   Problem: Elimination: Goal: Will not experience complications related to bowel motility Outcome: Not Progressing Goal: Will not experience complications related to urinary retention Outcome: Not Progressing   Problem: Pain Managment: Goal: General experience of comfort will improve and/or be controlled Outcome: Not Progressing   Problem: Safety: Goal: Ability to remain free from injury will improve Outcome: Not Progressing   Problem: Skin Integrity: Goal: Risk for impaired skin integrity will decrease Outcome: Not Progressing   Problem: Education: Goal: Ability to describe self-care measures that may prevent or decrease complications (Diabetes Survival Skills Education) will improve Outcome: Not Progressing Goal: Individualized Educational Video(s) Outcome: Not Progressing   Problem: Coping: Goal: Ability to adjust to condition or change in  health will improve Outcome: Not Progressing   Problem: Fluid Volume: Goal: Ability to maintain a balanced intake and output will improve Outcome: Not Progressing   Problem: Health Behavior/Discharge Planning: Goal: Ability to identify and utilize available resources and services will improve Outcome: Not Progressing Goal: Ability to manage health-related needs will improve Outcome: Not Progressing   Problem: Metabolic: Goal: Ability to maintain appropriate glucose levels will improve Outcome: Not Progressing   Problem: Nutritional: Goal: Maintenance of adequate nutrition will improve Outcome: Not Progressing Goal: Progress toward achieving an optimal weight will improve Outcome: Not Progressing   Problem: Skin Integrity: Goal: Risk for impaired skin integrity will decrease Outcome: Not Progressing   Problem: Tissue Perfusion: Goal: Adequacy of tissue perfusion will improve Outcome: Not Progressing   Problem: Safety: Goal: Non-violent Restraint(s) Outcome: Not Progressing

## 2024-02-28 NOTE — Progress Notes (Signed)
 NAMEJerod Daniels, MRN:  [id number], DOB:  [birth year], LOS: 75 ADMISSION DATE:  12/14/2023, CONSULTATION DATE:  02/24/2024 REFERRING MD:  MELODIE, CHIEF COMPLAINT:  septic shock, respiratory failure   History of Present Illness:   Alvin Daniels is a 53 y.o. male with medical history significant of ESRD on HD MWF, type 2 diabetes mellitus poorly controlled, HIV, chronic hepatitis C, PAD s/p bilateral BKA, and recent admission for MRSA septicemia, possible IE, and L middle finger OM s/p amputation per Ortho surgery on 5/29 (cultures grew E. 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   Pt was in [facility] until this past week when he had difficulty walking due to the pain in his R hip. The pain would bother him all day, but he was able to walk most of the day and rest in the evening. Yesterday, the pain was so bad that he was unable to walk at all, so he presented to the ED for further evaluation. Of note, pt was recently admitted for MRSA septicemia and possible IE and was continued on IV vancomycin  w/ HD until 7/19, and d/c recommended repeat TEE to eval for clearance of IE.   In the ED, pt AFVSS. Labs showed K 3.4, Cr 9.06, CRP 15.2, and ESR 133. MRI sacrum showed discitis and osteomyelitis at L4-5 with associated paraspinal inflammatory changes and right psoas Abscess. EDP ordered IV vancomycin /cefepime , and requested medicine admission.    Patient was brought into ICU because of increasing vasopressor needs.  He was eventually intubated.  Pertinent  Medical History  ESRD DM HIV Chronic Hep C PAD s/p bilateral BKA  Significant Hospital Events: Including procedures, antibiotic start and stop dates in addition to other pertinent events   02/21/2024 intubated for respiratory failure and vasopressor therapy was increasing during the day because of septic shock.  02/23/2024: proned at 10:30 and supinated  at 3 AM 02/24/2024: remains supinated, did well overnight. 02/25/2024: had family meeting with brother, heading to [place], interested in moving his body to hometown 02/26/2024: had a family meeting with 4 of his siblings and they decided to do a time limited trial of 48 hours. Will need to circle back with them on Saturday 02/28/24.  Interim History / Subjective:   Remains on the ventilator, CRRT Levophed .  Objective    Blood pressure 132/63, pulse 90, temperature 99 F (37.2 C), resp. rate 20, height 5' 2 (1.575 m), weight 38.9 kg, SpO2 100%.    Vent Mode: PRVC FiO2 (%):  [40 %] 40 % Set Rate:  [20 bmp] 20 bmp Vt Set:  [380 mL] 380 mL PEEP:  [8 cmH20] 8 cmH20 Plateau Pressure:  [17 cmH20-21 cmH20] 17 cmH20   Intake/Output Summary (Last 24 hours) at 02/28/2024 0753 Last data filed at 02/28/2024 0700 Gross per 24 hour  Intake 1785.88 ml  Output 2025 ml  Net -239.12 ml   Filed Weights   02/26/24 0500 02/27/24 0314 02/28/24 0457  Weight: 40.8 kg 39 kg 38.9 kg    Examination: Gen:      Cachectic, sunken eyes, ill-appearing HEENT:  EOMI, sclera anicteric Neck:     No masses; no thyromegaly Lungs:    Clear to auscultation bilaterally; normal respiratory effort CV:         Regular rate and rhythm; no murmurs Abd:      + bowel sounds; soft, non-tender; no palpable masses, no distension Ext:  Bilateral BKA Neuro: Unresponsive  Labs, Imaging reviewed Significant for BUN/creatinine 16/0.47 Hemoglobin 7.8, platelets 17  Resolved problem list   Assessment and Plan   #Septic Shock: likely septic in the setting of psoas abscess and discitis; shock is worsening. Continue pressors, wean as tolerated.  Midodrine  for BP support Continue Decadron   #Acute Hypoxic Respiratory Failure: concern for ARDS now s/p 1 session of proning with improved ventilator requirements. Continue vent support Chest x-ray, ABG intermittently Wean when mental status improves  #MRSA Discitis/Psoas  Abscess: - Continue Vancomycin , no end date for now given that there's no surgical intervention at this time.  # Abundant GNR growing in trach aspirate: - Finished a course of Zosyn  on 10/17  #Stress Ulcer PPx: PPI  #Nutrition: advance diet as tolerated  #ESRD: Continue CRRT  #VTE ppx: holding heparin  subQ due to thrombocytopenia  #Thrombocytopenia: likely DIC and potentially some contribution from CRRT. - Continue to monitor PLT, goal > 10 K - Transfuse PLT to meet goal - No evidence of active bleed.  #Hyperglycemia: - Continue SSI coverage  #PADs: - Propofo; - Fentanyl   #GOC: Family meeting planned on 02/28/24 to decide on long term GOC. Suspect patient's family will decide to make him comfortable.  Disposition: ICU appropriate for respiratory failure and shock state.  Critical care time:   The patient is critically ill with multiple organ system failure and requires high complexity decision making for assessment and support, frequent evaluation and titration of therapies, advanced monitoring, review of radiographic studies and interpretation of complex data.   Critical Care Time devoted to patient care services, exclusive of separately billable procedures, described in this note is 35  minutes.   Yolette Hastings MD [facility] See Amion for pager  If no response to pager , please call [phone] until 7pm After 7:00 pm call Elink  [phone] 02/28/2024, 7:54 AM

## 2024-02-28 NOTE — Progress Notes (Signed)
 Daily Progress Note   Patient Name: Alvin Daniels       Date: 02/28/2024 DOB: [birth year]  Age: 53 y.o. MRN#: [id number] Attending Physician: Mannam, Praveen, MD Primary Care Physician: Pcp, No Admit Date: 12/14/2023  Reason for Consultation/Follow-up: Establishing goals of care  Subjective: Medical records reviewed including progress notes, labs, imaging. Patient assessed at the bedside. He remains intubated and sedated. He remains on vasopressors. He is unresponsive. He appears very frail and ill. No family at the bedside.   Call to patient's brother. He seemed surprised to hear that patient is still doing poorly. We review his meeting with medical team 2 days ago. I ask him if the family has considered how [name] would want to move forward with care. He tells me he is unsure, shares how difficult these decisions are. I offer another family meeting - he thinks this would be helpful but does not know when his brothers are available. He tells me he will check with them and asks if we can follow up with him tomorrow to arrange family meeting.   Questions and concerns addressed. PMT will continue to support holistically.   Length of Stay: 86   Physical Exam Vitals and nursing note reviewed.  Constitutional:      Appearance: He is cachectic. He is ill-appearing.     Interventions: He is intubated.  HENT:     Head: Normocephalic and atraumatic.  Cardiovascular:     Rate and Rhythm: Normal rate.  Pulmonary:     Effort: He is intubated.  Skin:    General: Skin is warm and dry.  Neurological:     Comments: Sedated            Vital Signs: BP 132/63   Pulse 94   Temp 98.2 F (36.8 C)   Resp 20   Ht 5' 2 (1.575 m)   Wt 38.9 kg   SpO2 100%   BMI 15.69 kg/m  SpO2: SpO2: 100 % O2 Device: O2 Device:  Ventilator O2 Flow Rate: O2 Flow Rate (L/min): 40 L/min      Palliative Assessment/Data: 10%   Palliative Care Assessment & Plan   Patient Profile: Palliative Care consult requested for goals of care discussion in this 53 y.o. 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  He was admitted on 12/14/2023 from home with inability to walk due to  uncontrolled pain.  During workup MRI of sacrum showed discitis and osteomyelitis of L4-5 with associated paraspinal inflammatory changes and right psoas mass.  Patient started on IV vancomycin  and cefepime .   Assessment: Goals of care conversation End-stage renal disease on CRRT Acute hypoxic respiratory failure with concern for ARDS, improving after proning Septic shock MRSA psoas abscess and discitis PAD s/p bilateral BKA's  Recommendations/Plan: Continue DNR Discussed with brother today that patient continues to do poorly, that we need to consider how to move forward and what he would want Continue current care plan, PMT provider to f/u 10/19 to attempt scheduling another family meeting PMT will continue to follow and support  Prognosis: Very poor  Discharge Planning: Anticipated Hospital Death  Care plan was discussed with patient's brother [name], MD, RN         Tobey Jama Nat Barnacle, NP  Palliative Medicine Team Team phone # [phone]  Thank you for allowing the Palliative Medicine Team to assist in the care of this patient. Please utilize secure chat with additional questions, if there is no response within 30 minutes please call the above phone number.  Palliative Medicine Team providers are available by phone from 7am to 7pm daily and can be reached through the  team cell phone.  Should this patient require assistance outside of these hours, please call the patient's attending physician.     Time Total: 30 minutes  Visit consisted of counseling and education dealing with the complex and emotionally intense issues of symptom management and palliative care in the setting of serious and potentially life-threatening illness. Greater than 50% of this time was spent counseling and coordinating care related to the above assessment and plan.  Personally spent 50 minutes in patient care including extensive chart review (labs, imaging, progress/consult notes, vital signs), medically appropraite exam, discussed with treatment team, education to patient, family, and staff, documenting clinical information, medication review and management, coordination of care, and available advanced directive documents.

## 2024-02-28 NOTE — Progress Notes (Signed)
 Attempted to wean propofol  due to pt's RASS.  However, at 10 mcg of propofol , pt began to exhibit signs of discomfort despite Fentanyl  gtt.  Pt has increase in respirations to 28, SBP reaching 150s.  Gag reflex also noted during oral care unlike on initial assessment.  Propofol  increased to 15 mcg.  Will continue to monitor.

## 2024-02-28 NOTE — Plan of Care (Signed)
  Problem: Clinical Measurements: Goal: Cardiovascular complication will be avoided Outcome: Progressing   Problem: Nutrition: Goal: Adequate nutrition will be maintained Outcome: Progressing   Problem: Pain Managment: Goal: General experience of comfort will improve and/or be controlled Outcome: Progressing   Problem: Safety: Goal: Ability to remain free from injury will improve Outcome: Progressing   Problem: Skin Integrity: Goal: Risk for impaired skin integrity will decrease Outcome: Progressing   Problem: Clinical Measurements: Goal: Ability to maintain clinical measurements within normal limits will improve Outcome: Not Progressing Goal: Will remain free from infection Outcome: Not Progressing Goal: Diagnostic test results will improve Outcome: Not Progressing Goal: Respiratory complications will improve Outcome: Not Progressing

## 2024-02-28 NOTE — Progress Notes (Addendum)
 [facility] Progress Note  Subjective:  I/O net negative 930 cc yesterday Levo gtt is off this am, off vaso gtt as well  Vitals:   02/28/24 1215 02/28/24 1230 02/28/24 1245 02/28/24 1300  BP:      Pulse:  92    Resp: 11 11 11 11   Temp: 98.6 F (37 C) 98.4 F (36.9 C) 98.4 F (36.9 C) 98.4 F (36.9 C)  TempSrc:      SpO2:  100%    Weight:      Height:        Exam: General: Chronically ill, frail, cachectic, on vent Heart: RRR Lungs: CTAB Abdomen: soft Extremities: mild bilat hip edema, B BKA Dialysis Access: TDC intact    OP HD: [facility] - MWF 3.5h  B400  45.5kg  TDC  Heparin  2000 Mircera 100 mcg - last dose 12/01/23 Calcitriol  1.0 mcg (on hold due to high Ca) Uses midodrine  10mg  TID + extra pre-HD    Assessment/ Plan: Shock: due to sepsis. 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 Had a long course of IV Vancomycin , however, repeat MRI 10/8 showed the infection has progressed. Per NSGY not a surgical candidate.  Is back on IV vanc/ zosyn  now.  Volume overload/ pulm edema: CXR 10/11 and 11/12 showed severe worsening pulm edema. CXR 11/14 showed sig improvement after vol removal. Wts are down in the 38-40kg range, prob new dry wt. DC'd UF w/ CRRT yesterday. Cont to keep even.  ESRD: HD TTS though this admit.  Acute decompensation causing need for CRRT started 10/12. Continue.  BP: Chronic hypotension on mido 10mg  TID and levo / vaso gtt's this am.   Anemia of ESRD: Hgb 9.9 - continue Aranesp  150mcg q Friday. No IV iron  d/t infection. BMD: CorrCa high, VDRA on hold. Phos on goal, on sevelamer  as binder.  GOC: CCM having family discussions. Very poor prognosis, would support transition to comfort care.    Myer Fret MD  [facility] 02/28/2024, 1:08 PM  Recent Labs  Lab 02/27/24 1600 02/28/24 0455 02/28/24 0647  HGB  --  7.4* 7.8*  ALBUMIN  2.3* 2.0*  --   CALCIUM  8.1* 8.4*  --   PHOS 2.0*  1.2*  --   CREATININE 0.51* 0.47*  --   K 4.0 4.1  --    No results for input(s): IRON , TIBC, FERRITIN in the last 168 hours. Inpatient medications:  acetaminophen   1,000 mg Per Tube TID   acyclovir ointment  1 Application Topical 6 X Daily   Chlorhexidine  Gluconate Cloth  6 each Topical Daily   darbepoetin (ARANESP ) injection - DIALYSIS  150 mcg Subcutaneous Q Fri-1800   dexamethasone  (DECADRON ) injection  10 mg Intravenous Q24H   docusate  100 mg Per Tube BID   dolutegravir   50 mg Per Tube Daily   emtricitabine -tenofovir  AF  1 tablet Per Tube Daily   feeding supplement (PROSource TF20)  60 mL Per Tube Daily   gabapentin   100 mg Per Tube Q8H   gatifloxacin   1 drop Right Eye BID   insulin  aspart  0-5 Units Subcutaneous QHS   insulin  aspart  0-6 Units Subcutaneous TID WC   methocarbamol   500 mg Per Tube TID   midodrine   10 mg Per Tube TID WC   multivitamin  1 tablet Per Tube QHS   mouth rinse  15 mL Mouth Rinse Q2H   pantoprazole  (PROTONIX ) IV  40 mg Intravenous Q12H  polyethylene glycol  17 g Per Tube Daily   sodium chloride  flush  10-40 mL Intracatheter Q12H    feeding supplement (OSMOLITE 1.5 CAL) 15 mL/hr at 02/28/24 1300   fentaNYL  infusion INTRAVENOUS 125 mcg/hr (02/28/24 1300)   norepinephrine  (LEVOPHED ) Adult infusion Stopped (02/28/24 1233)   prismasol BGK 4/2.5 400 mL/hr at 02/28/24 1010   prismasol BGK 4/2.5 1,500 mL/hr at 02/28/24 1201   prismasol BGK 4/2.5 400 mL/hr at 02/28/24 1010   propofol  (DIPRIVAN ) infusion 20 mcg/kg/min (02/28/24 1300)   sodium PHOSPHATE  IVPB (in mmol) 43 mL/hr at 02/28/24 1300   vancomycin  Stopped (02/27/24 1655)   vasopressin Stopped (02/27/24 1134)   dextrose , fentaNYL , heparin , hydrALAZINE , ibuprofen , ipratropium-albuterol , lip balm, metoprolol  tartrate, ondansetron  (ZOFRAN ) IV, mouth rinse, oxyCODONE , sodium chloride , sodium chloride  flush

## 2024-02-29 LAB — BASIC METABOLIC PANEL WITH GFR
Anion gap: 9 (ref 5–15)
BUN: 19 mg/dL (ref 6–20)
CO2: 24 mmol/L (ref 22–32)
Calcium: 8.2 mg/dL — ABNORMAL LOW (ref 8.9–10.3)
Chloride: 100 mmol/L (ref 98–111)
Creatinine, Ser: 0.4 mg/dL — ABNORMAL LOW (ref 0.61–1.24)
GFR, Estimated: >60 mL/min (ref >60)
Glucose, Bld: 124 mg/dL — ABNORMAL HIGH (ref 70–99)
Potassium: 4.4 mmol/L (ref 3.5–5.1)
Sodium: 133 mmol/L — ABNORMAL LOW (ref 135–145)

## 2024-02-29 LAB — GLUCOSE, CAPILLARY
Glucose-Capillary: 100 mg/dL — ABNORMAL HIGH (ref 70–99)
Glucose-Capillary: 88 mg/dL (ref 70–99)
Glucose-Capillary: 134 mg/dL — ABNORMAL HIGH (ref 70–99)
Glucose-Capillary: 100 mg/dL — ABNORMAL HIGH (ref 70–99)

## 2024-02-29 LAB — MAGNESIUM: Magnesium: 2.4 mg/dL (ref 1.7–2.4)

## 2024-02-29 LAB — RENAL FUNCTION PANEL
Albumin: 1.8 g/dL — ABNORMAL LOW (ref 3.5–5.0)
Albumin: 1.9 g/dL — ABNORMAL LOW (ref 3.5–5.0)
Anion gap: 8 (ref 5–15)
Anion gap: 8 (ref 5–15)
BUN: 17 mg/dL (ref 6–20)
BUN: 25 mg/dL — ABNORMAL HIGH (ref 6–20)
CO2: 25 mmol/L (ref 22–32)
CO2: 24 mmol/L (ref 22–32)
Calcium: 8.3 mg/dL — ABNORMAL LOW (ref 8.9–10.3)
Calcium: 8.1 mg/dL — ABNORMAL LOW (ref 8.9–10.3)
Chloride: 100 mmol/L (ref 98–111)
Chloride: 101 mmol/L (ref 98–111)
Creatinine, Ser: 0.4 mg/dL — ABNORMAL LOW (ref 0.61–1.24)
Creatinine, Ser: 0.44 mg/dL — ABNORMAL LOW (ref 0.61–1.24)
GFR, Estimated: >60 mL/min (ref >60)
GFR, Estimated: >60 mL/min (ref >60)
Glucose, Bld: 125 mg/dL — ABNORMAL HIGH (ref 70–99)
Glucose, Bld: 150 mg/dL — ABNORMAL HIGH (ref 70–99)
Phosphorus: 2.2 mg/dL — ABNORMAL LOW (ref 2.5–4.6)
Phosphorus: 1.9 mg/dL — ABNORMAL LOW (ref 2.5–4.6)
Potassium: 4.4 mmol/L (ref 3.5–5.1)
Potassium: 4.4 mmol/L (ref 3.5–5.1)
Sodium: 133 mmol/L — ABNORMAL LOW (ref 135–145)
Sodium: 133 mmol/L — ABNORMAL LOW (ref 135–145)

## 2024-02-29 LAB — CBC
HCT: 22.1 % — ABNORMAL LOW (ref 39.0–52.0)
Hemoglobin: 7.3 g/dL — ABNORMAL LOW (ref 13.0–17.0)
MCH: 32.2 pg (ref 26.0–34.0)
MCHC: 33 g/dL (ref 30.0–36.0)
MCV: 97.4 fL (ref 80.0–100.0)
Platelets: 21 K/uL — CL (ref 150–400)
RBC: 2.27 MIL/uL — ABNORMAL LOW (ref 4.22–5.81)
RDW: 19.4 % — ABNORMAL HIGH (ref 11.5–15.5)
WBC: 11 K/uL — ABNORMAL HIGH (ref 4.0–10.5)
nRBC: 0 % (ref 0.0–0.2)

## 2024-02-29 MED ORDER — SODIUM CHLORIDE 0.9 % IV SOLN: INTRAVENOUS | Status: AC | PRN

## 2024-02-29 MED ORDER — ORAL CARE MOUTH RINSE: 15.0000 mL | OROMUCOSAL | Status: DC | PRN

## 2024-02-29 NOTE — Evaluation (Signed)
 RT Evaluate and Treat Note  02/29/2024   Breathing is (select one): Cannot answer questions, unresponsive on ventilator   The following was found on auscultation (select multiple):  Bilateral Breath Sounds: Clear;Diminished (02/29/24 0917)  R Upper  Breath Sounds: Clear (02/29/24 0800) L Upper Breath Sounds: Clear (02/29/24 0800) R Lower Breath Sounds: Diminished (02/29/24 0800) L Lower Breath Sounds: Diminished (02/29/24 0800)    Cough Assessment: Cough: None (02/28/24 1600)    Most Recent Chest Xray:... (No results found.    The following medications and/or interventions were ordered/changed/discontinued as part of the Respiratory Treatment protocol:   Medication Changes: None   Airway Clearance Changes: None, continue ET tube suctioning   Oxygen Therapy Changes: none, remains on ventilator

## 2024-02-29 NOTE — Progress Notes (Signed)
 PCCM interval note  Called to the bedside as patient pulled his ET tube out. Appears to be holding his respiratory status for now. I called his brother [name] and told him that the patient needs to be a full DNR as we should not be reintubating him in case he decompensates and I asked to have a family meeting today to clarify further goals of care and to stop CRRT as his prognosis for recovery is poor. [name] was in agreement as would try to come to the bedside today.   Additional CC time- 30 mins  Elbie Statzer MD [facility] See Amion for pager  If no response to pager , please call [phone] until 7pm After 7:00 pm call Elink  [phone] 02/29/2024, 1:17 PM

## 2024-02-29 NOTE — IPAL (Signed)
  Interdisciplinary Goals of Care Family Meeting   Date carried out:: 02/29/2024  Location of the meeting: Conference room  Member's involved: Physician, Bedside Registered Nurse, and Family Member or next of kin  Durable Power of Attorney or acting medical decision maker: Brother Alvin Daniels and other members of family.      Discussion: We discussed goals of care for Alvin Daniels .   Reviewed his clinical course and events of today including self extubation.  He remains on CRRT and anuric.  Mental status is poor and prognosis for recovery is dismal.  We discussed that he is full DNR now with no plans on reintubation or CPR.  We also discussed the moving to comfort measures and stopping CRRT but Alvin Daniels is finding it very difficult to do it.  He wants to discuss further with the brothers.  Requested that we continue all other measures for now.  Towards end of the meeting he became upset and defensive saying that he is being pressured at every meeting to make a decision on withdrawal of care.  Code status: Full DNR  Disposition: Continue current acute care   Time spent for the meeting: 20 mins  Joclynn Lumb 02/29/2024, 6:55 PM

## 2024-02-29 NOTE — Progress Notes (Signed)
   Palliative Medicine Inpatient Follow Up Note   HPI: 53 y.o. male with past medical history of ESRD on HD MWF, poorly controlled diabetes type 2, chronic hepatitis C, HIV, PAD s/p bilateral BKA, MRSA septicemia, left middle finger osteomyelitis s/p amputation, and Streptococcus anginosus with right hip pain, right psoas abscess consistent with L4-5 osteomyelitis on MRI. He was admitted on 12/14/2023 from home with inability to walk due to uncontrolled pain. During workup MRI of sacrum showed discitis and osteomyelitis of L4-5 with associated paraspinal inflammatory changes and right psoas mass. Patient started on IV vancomycin  and cefepime .    Today's Discussion 02/29/2024  *Please note that this is a verbal dictation therefore any spelling or grammatical errors are due to the Dragon Medical One system interpretation.  Chart reviewed inclusive of vital signs, progress notes, laboratory results, and diagnostic images.   I visited the patient at the bedside today, no family members were present. The patient appears critically ill, remains intubated and dependent on mechanical ventilation. He continues to require vasopressor support, and continuous renal replacement therapy (CRRT) is ongoing.  I spoke with the ICU nurse, who reported no significant overnight events.  I attempted to contact the patient's brother, [name], by phone to arrange a goals-of-care (GOC) follow-up discussion with family, given the patient's multi-organ failure, poor overall prognosis, and low likelihood of meaningful recovery. I was unable to reach him and left a voicemail requesting a return call.  Palliative Support Provided.   Objective Assessment: Vital Signs Vitals:   02/29/24 1715 02/29/24 1730  BP:    Pulse: 95 96  Resp: 16 17  Temp:    SpO2: 100% 97%    Intake/Output Summary (Last 24 hours) at 02/29/2024 1747 Last data filed at 02/29/2024 1700 Gross per 24 hour  Intake 1159.39 ml  Output 798 ml   Net 361.39 ml   Last Weight  Most recent update: 02/29/2024  5:41 AM    Weight  38.4 kg (84 lb 10.5 oz)             Examination: Gen:     Malnourished, critically ill-appearing Neck:     ET tube Lungs:    Clear to auscultation bilaterally; normal respiratory effort CV:         Regular rate  Abd:     no distension Ext:          Bilateral BKA Neuro: Unresponsive    SUMMARY OF RECOMMENDATIONS   Continue DNR Attempted to reach out to patient's brother telephonically to arranged for follow-up GOC meeting, no answer. I left a VM to return call.  PMT will continue to follow and support   Prognosis: Very poor   Discharge Planning: Anticipated Hospital Death  Time Spent: 35 minutes  Detailed review of medical records (labs, imaging, vital signs), medically appropriate exam, discussed with treatment team, counseling and education to patient, family, & staff, documenting clinical information, coordination of care.   ______________________________________________________________________________________ Kathlyne Bolder NP-C [facility]iative [facility] Team Team Cell Phone: [phone] Please utilize secure chat with additional questions, if there is no response within 30 minutes please call the above phone number  Palliative Medicine Team providers are available by phone from 7am to 7pm daily and can be reached through the team cell phone.  Should this patient require assistance outside of these hours, please call the patient's attending physician.

## 2024-02-29 NOTE — Progress Notes (Addendum)
 NAMEGavan Daniels, MRN:  [id number], DOB:  Aug 16, 1970, LOS: 76 ADMISSION DATE:  12/14/2023, CONSULTATION DATE:  02/24/2024 REFERRING MD:  MELODIE, CHIEF COMPLAINT:  septic shock, respiratory failure   History of Present Illness:   Alvin Daniels is a 53 y.o. male with medical history significant of ESRD on HD MWF, type 2 diabetes mellitus poorly controlled, HIV, chronic hepatitis C, PAD s/p bilateral BKA, and recent admission for MRSA septicemia, possible IE, and L middle finger OM s/p amputation per Ortho surgery on 5/29 (cultures grew E. 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   Pt was in USOH until this past week when he had difficulty walking due to the pain in his R hip. The pain would bother him all day, but he was able to walk most of the day and rest in the evening. Yesterday, the pain was so bad that he was unable to walk at all, so he presented to the ED for further evaluation. Of note, pt was recently admitted for MRSA septicemia and possible IE and was continued on IV vancomycin  w/ HD until 7/19, and d/c recommended repeat TEE to eval for clearance of IE.   In the ED, pt AFVSS. Labs showed K 3.4, Cr 9.06, CRP 15.2, and ESR 133. MRI sacrum showed discitis and osteomyelitis at L4-5 with associated paraspinal inflammatory changes and right psoas Abscess. EDP ordered IV vancomycin /cefepime , and requested medicine admission.    Patient was brought into ICU because of increasing vasopressor needs.  He was eventually intubated.  Pertinent  Medical History  ESRD DM HIV Chronic Hep C PAD s/p bilateral BKA  Significant Hospital Events: Including procedures, antibiotic start and stop dates in addition to other pertinent events   02/21/2024 intubated for respiratory failure and vasopressor therapy was increasing during the day because of septic shock.  02/23/2024: proned at 10:30 and supinated  at 3 AM 02/24/2024: remains supinated, did well overnight. 02/25/2024: had family meeting with brother, heading to [place], interested in moving his body to hometown 02/26/2024: had a family meeting with 4 of his siblings and they decided to do a time limited trial of 48 hours. Will need to circle back with them on Saturday 02/28/24. 02/28/2024: Family avoiding meeting for goals of care  Interim History / Subjective:   Remains critically ill on the ventilator, pressors and CRRT Family meeting was planned for 10/18 but they did not show up.  Objective    Blood pressure 132/63, pulse 76, temperature 98.1 F (36.7 C), resp. rate 20, height 5' 2 (1.575 m), weight 38.4 kg, SpO2 100%.    Vent Mode: PRVC FiO2 (%):  [40 %] 40 % Set Rate:  [20 bmp] 20 bmp Vt Set:  [380 mL] 380 mL PEEP:  [8 cmH20] 8 cmH20 Plateau Pressure:  [18 cmH20-19 cmH20] 19 cmH20   Intake/Output Summary (Last 24 hours) at 02/29/2024 0722 Last data filed at 02/29/2024 0700 Gross per 24 hour  Intake 1719.26 ml  Output 1461 ml  Net 258.26 ml   Filed Weights   02/27/24 0314 02/28/24 0457 02/29/24 0500  Weight: 39 kg 38.9 kg 38.4 kg    Examination: Gen:   Malnourished, sunken eyes, critically ill-appearing HEENT:  EOMI, sclera anicteric Neck:     No masses; no thyromegaly, ET tube Lungs:    Clear to auscultation bilaterally; normal respiratory effort CV:         Regular rate  and rhythm; no murmurs Abd:      + bowel sounds; soft, non-tender; no palpable masses, no distension Ext:    Bilateral BKA Neuro: Unresponsive  Checklist Labs, Imaging reviewed Sodium 133, creatinine 0.40 WBC 11.0, hemoglobin 7.3, platelets 41  Resolved problem list   Assessment and Plan  Septic shock-likely septic in the setting of psoas abscess and discitis Sepsis present on admission Wean pressors as tolerated Continue midodrine , Decadron   Acute hypoxic respiratory failure: concern for ARDS now s/p 1 session of proning with  improved ventilator requirements. Continue vent support Mental status is poor to liberate from the ventilator Follow intermittent chest x-ray, ABG  MRSA Discitis/Psoas Abscess: Continue vancomycin  for now.  Unable to do source control so will need long-term antibiotic Finished a course of Zosyn  on 10/17  End-stage renal disease Continue CRRT  Anemia of chronic disease present on admission Thrombocytopenia Monitor CBC Transfuse if platelets drop less than 10 or if there is evidence of active bleed.  Hyperglycemia SSI coverage  Goals of care Family meeting 10/18 but they did not show up.  Palliative working with family to reschedule but they appear unrealistic about his prognosis for recovery Code status is DNR  #Stress Ulcer PPx: PPI #Nutrition: advance diet as tolerated #VTE ppx: holding heparin  subQ due to thrombocytopenia  Critical care time:   The patient is critically ill with multiple organ system failure and requires high complexity decision making for assessment and support, frequent evaluation and titration of therapies, advanced monitoring, review of radiographic studies and interpretation of complex data.   Critical Care Time devoted to patient care services, exclusive of separately billable procedures, described in this note is 34 minutes.   Leshawn Houseworth MD [facility] See Amion for pager  If no response to pager , please call [phone] until 7pm After 7:00 pm call Elink  [phone] 02/29/2024, 7:36 AM

## 2024-02-29 NOTE — Progress Notes (Signed)
 [facility] Progress Note  Subjective:  I/O yest net even  Levo gtt is low this am, no other pressors  Vitals:   02/29/24 0945 02/29/24 1000 02/29/24 1015 02/29/24 1030  BP:      Pulse: 92 92 88 92  Resp: (!) 27 19 20  (!) 23  Temp: 98.1 F (36.7 C) 98.1 F (36.7 C) 97.9 F (36.6 C) 98.1 F (36.7 C)  TempSrc:  Esophageal    SpO2: 100% 100% 100% 100%  Weight:      Height:        Exam: General: chronically ill, frail, cachectic, on vent Heart: RRR Lungs: CTAB Abdomen: soft Extremities: mild bilat hip edema, much better  B BKA Dialysis Access: TDC intact    OP HD: [facility] - MWF 3.5h  B400  45.5kg  TDC  Heparin  2000 Mircera 100 mcg - last dose 12/01/23 Calcitriol  1.0 mcg (on hold due to high Ca) Uses midodrine  10mg  TID + extra pre-HD    Assessment/ Plan: Shock: due to sepsis. Getting IV abx. Levo gtt very low dose this morning, has been off vaso since 10/17.  L4-5 discitis/osteomyelitis/psoas abscess: S/p IR aspiration 8/27, grew MRSA. Had a long course of IV Vancomycin , however, repeat MRI 10/8 showed the infection had progressed. Per NSGY not a surgical candidate.  Is back on IV vanc/ zosyn . WBC was down, coming up to 11K yesterday.  Volume overload/ pulm edema: CXR 10/11 and 11/12 showed severe worsening pulm edema. CXR 11/14 showed sig improvement after vol removal. Wts are down, 38-40kg range, prob this is new dry wt. DC'd UF w/ CRRT yesterday. Today will change to net +50 cc/hr to make up for 1L daily insensible losses.  ESRD: HD TTS though this admit.  Acute decompensation causing need for CRRT started 10/12. Could be close to coming off CRRT if can remain off pressors.  Anemia of ESRD: Hgb 9.9 - continue Aranesp  150mcg q Friday. No IV iron  d/t infection. BMD: CorrCa high, VDRA on hold. Phos on goal, on sevelamer  as binder.  GOC: CCM having family discussions. Non-healing back infection w/ severe pain despite 8 wks of IV abx. Very poor prognosis, support  transition to comfort care.    Myer Fret MD  [facility] 02/29/2024, 10:47 AM  Recent Labs  Lab 02/28/24 0647 02/28/24 1517 02/29/24 0410  HGB 7.8*  --  7.3*  ALBUMIN   --  2.0* 1.8*  CALCIUM   --  8.0* 8.2*  8.3*  PHOS  --  3.8 2.2*  CREATININE  --  0.45* 0.40*  0.40*  K  --  4.4 4.4  4.4   No results for input(s): IRON , TIBC, FERRITIN in the last 168 hours. Inpatient medications:  acetaminophen   1,000 mg Per Tube TID   Chlorhexidine  Gluconate Cloth  6 each Topical Daily   darbepoetin (ARANESP ) injection - DIALYSIS  150 mcg Subcutaneous Q Fri-1800   dexamethasone  (DECADRON ) injection  10 mg Intravenous Q24H   dolutegravir   50 mg Per Tube Daily   emtricitabine -tenofovir  AF  1 tablet Per Tube Daily   feeding supplement (PROSource TF20)  60 mL Per Tube Daily   gabapentin   100 mg Per Tube Q8H   gatifloxacin   1 drop Right Eye BID   methocarbamol   500 mg Per Tube TID   midodrine   10 mg Per Tube TID WC   multivitamin  1 tablet Per Tube QHS   mouth rinse  15 mL Mouth Rinse Q2H   pantoprazole  (PROTONIX ) IV  40 mg  Intravenous Q12H   sodium chloride  flush  10-40 mL Intracatheter Q12H    feeding supplement (OSMOLITE 1.5 CAL) 15 mL/hr at 02/29/24 1000   fentaNYL  infusion INTRAVENOUS 75 mcg/hr (02/29/24 1000)   norepinephrine  (LEVOPHED ) Adult infusion Stopped (02/29/24 0744)   prismasol BGK 4/2.5 400 mL/hr at 02/28/24 2308   prismasol BGK 4/2.5 1,500 mL/hr at 02/29/24 0855   prismasol BGK 4/2.5 400 mL/hr at 02/28/24 2308   propofol  (DIPRIVAN ) infusion Stopped (02/29/24 0741)   vancomycin  Stopped (02/28/24 1612)   vasopressin Stopped (02/27/24 1134)   dextrose , fentaNYL , heparin , hydrALAZINE , ibuprofen , ipratropium-albuterol , lip balm, metoprolol  tartrate, ondansetron  (ZOFRAN ) IV, mouth rinse, oxyCODONE , sodium chloride , sodium chloride  flush

## 2024-02-29 NOTE — Progress Notes (Signed)
 RN called out saying patient had self extubated. I came in to see. Patient was maintaining SpO2 & Dr Theophilus was called to bedside. He said to place [place] on patient & keep an eye on him for now. He will speak with family.

## 2024-02-29 NOTE — Progress Notes (Signed)
 While RN off floor, pt self extubated. Upon return to unit RN updated on events. PT transitioned to NRB due to O2 sats of 88%. Poor cough noted. Lung sounds diminished bilaterally.  MD at bedside, and stated to hold off on replacing NG tube today.

## 2024-03-01 DIAGNOSIS — D709 Neutropenia, unspecified: Secondary | ICD-10-CM

## 2024-03-01 LAB — CBC
HCT: 19.9 % — ABNORMAL LOW (ref 39.0–52.0)
Hemoglobin: 6.7 g/dL — CL (ref 13.0–17.0)
MCH: 32.1 pg (ref 26.0–34.0)
MCHC: 33.7 g/dL (ref 30.0–36.0)
MCV: 95.2 fL (ref 80.0–100.0)
Platelets: 20 K/uL — CL (ref 150–400)
RBC: 2.09 MIL/uL — ABNORMAL LOW (ref 4.22–5.81)
RDW: 18.6 % — ABNORMAL HIGH (ref 11.5–15.5)
WBC: 0.9 K/uL — CL (ref 4.0–10.5)
nRBC: 0 % (ref 0.0–0.2)

## 2024-03-01 LAB — GLUCOSE, CAPILLARY
Glucose-Capillary: 105 mg/dL — ABNORMAL HIGH (ref 70–99)
Glucose-Capillary: 114 mg/dL — ABNORMAL HIGH (ref 70–99)
Glucose-Capillary: 118 mg/dL — ABNORMAL HIGH (ref 70–99)
Glucose-Capillary: 118 mg/dL — ABNORMAL HIGH (ref 70–99)
Glucose-Capillary: 124 mg/dL — ABNORMAL HIGH (ref 70–99)
Glucose-Capillary: 132 mg/dL — ABNORMAL HIGH (ref 70–99)
Glucose-Capillary: 37 mg/dL — CL (ref 70–99)
Glucose-Capillary: 46 mg/dL — ABNORMAL LOW (ref 70–99)
Glucose-Capillary: 52 mg/dL — ABNORMAL LOW (ref 70–99)
Glucose-Capillary: 78 mg/dL (ref 70–99)
Glucose-Capillary: 79 mg/dL (ref 70–99)
Glucose-Capillary: 88 mg/dL (ref 70–99)

## 2024-03-01 LAB — BASIC METABOLIC PANEL WITH GFR
Anion gap: 8 (ref 5–15)
BUN: 24 mg/dL — ABNORMAL HIGH (ref 6–20)
CO2: 24 mmol/L (ref 22–32)
Calcium: 8.1 mg/dL — ABNORMAL LOW (ref 8.9–10.3)
Chloride: 102 mmol/L (ref 98–111)
Creatinine, Ser: 0.45 mg/dL — ABNORMAL LOW (ref 0.61–1.24)
GFR, Estimated: >60 mL/min (ref >60)
Glucose, Bld: 88 mg/dL (ref 70–99)
Potassium: 3.9 mmol/L (ref 3.5–5.1)
Sodium: 134 mmol/L — ABNORMAL LOW (ref 135–145)

## 2024-03-01 LAB — RENAL FUNCTION PANEL
Albumin: 1.7 g/dL — ABNORMAL LOW (ref 3.5–5.0)
Albumin: 1.6 g/dL — ABNORMAL LOW (ref 3.5–5.0)
Anion gap: 7 (ref 5–15)
Anion gap: 9 (ref 5–15)
BUN: 24 mg/dL — ABNORMAL HIGH (ref 6–20)
BUN: 19 mg/dL (ref 6–20)
CO2: 25 mmol/L (ref 22–32)
CO2: 24 mmol/L (ref 22–32)
Calcium: 8.1 mg/dL — ABNORMAL LOW (ref 8.9–10.3)
Calcium: 7.6 mg/dL — ABNORMAL LOW (ref 8.9–10.3)
Chloride: 103 mmol/L (ref 98–111)
Chloride: 102 mmol/L (ref 98–111)
Creatinine, Ser: 0.43 mg/dL — ABNORMAL LOW (ref 0.61–1.24)
Creatinine, Ser: 0.45 mg/dL — ABNORMAL LOW (ref 0.61–1.24)
GFR, Estimated: >60 mL/min (ref >60)
GFR, Estimated: >60 mL/min (ref >60)
Glucose, Bld: 87 mg/dL (ref 70–99)
Glucose, Bld: 121 mg/dL — ABNORMAL HIGH (ref 70–99)
Phosphorus: 1.8 mg/dL — ABNORMAL LOW (ref 2.5–4.6)
Phosphorus: 3.5 mg/dL (ref 2.5–4.6)
Potassium: 3.9 mmol/L (ref 3.5–5.1)
Potassium: 3.6 mmol/L (ref 3.5–5.1)
Sodium: 135 mmol/L (ref 135–145)
Sodium: 135 mmol/L (ref 135–145)

## 2024-03-01 LAB — TRIGLYCERIDES: Triglycerides: <15 mg/dL (ref <150)

## 2024-03-01 LAB — MAGNESIUM: Magnesium: 2.3 mg/dL (ref 1.7–2.4)

## 2024-03-01 MED ORDER — DEXTROSE 50 % IV SOLN
25.0000 g | INTRAVENOUS | Status: AC
  Administered 2024-03-01: 25 g via INTRAVENOUS

## 2024-03-01 MED ORDER — DEXTROSE 5 % IV SOLN: INTRAVENOUS | Status: DC

## 2024-03-01 MED ORDER — ACETAMINOPHEN 10 MG/ML IV SOLN
500.0000 mg | Freq: Once | INTRAVENOUS | Status: AC
  Administered 2024-03-01: 500 mg via INTRAVENOUS
  Filled 2024-03-01: qty 100

## 2024-03-01 MED ORDER — NOREPINEPHRINE 16 MG/250ML-% IV SOLN
0.0000 ug/min | INTRAVENOUS | Status: DC
  Administered 2024-03-01: 2 ug/min via INTRAVENOUS
  Filled 2024-03-01: qty 250

## 2024-03-01 MED ORDER — SODIUM CHLORIDE 0.9 % IV SOLN
30.0000 mmol | Freq: Once | INTRAVENOUS | Status: AC
  Administered 2024-03-01: 30 mmol via INTRAVENOUS
  Filled 2024-03-01: qty 10

## 2024-03-01 MED ORDER — DEXTROSE 20 % IV SOLN
INTRAVENOUS | Status: DC
Start: 2024-03-01 — End: 2024-03-02
  Filled 2024-03-01 (×7): qty 500

## 2024-03-01 MED ORDER — NOREPINEPHRINE 4 MG/250ML-% IV SOLN: 0.0000 ug/min | INTRAVENOUS | Status: DC

## 2024-03-01 NOTE — Progress Notes (Signed)
 Patient ID: Alvin Daniels, male   DOB: 13-Nov-1970, 53 y.o.   MRN: [id number]  [facility] Progress Note   Assessment/ Plan:   1.  Septic shock: Associated with worsening L4/L5 discitis with osteomyelitis and psoas abscess.  Not a candidate for surgical intervention and remains on vancomycin  (previously on Zosyn  that was discontinued).  Unfortunately remains hypotensive and requiring low-dose Levophed .  Overall prognosis exceedingly poor and appreciate communication between CCM and family to reestablish GOC. 2. ESRD: Continue CRRT at this time for clearance/management of azotemia and fluid balance.  Will consider transitioning to intermittent hemodialysis when off pressors. 3. Anemia: Without overt blood loss, continue current ESA dosing with PRBC transfusions as indicated. 4. CKD-MBD: With CRRT associated hypophosphatemia, on replacement schedule per pharmacy protocol. 5. Nutrition: Continue current tube feeds with Osmolite and dextrose  supplementation with IV fluids. 6. Hypotension: On low-dose Levophed  and ongoing midodrine .  Continue net positive from CRRT fluid standpoint at this time to keep up with insensible losses.  Subjective:   Overnight events noted.  Goals of care discussion held between CCM service and family yesterday with plan made to continue acute care and maintain DNR status.   Objective:   BP (!) 118/57   Pulse (!) 105   Temp 98.8 F (37.1 C)   Resp (!) 26   Ht 5' 2 (1.575 m)   Wt 34.6 kg   SpO2 96%   BMI 13.95 kg/m   Physical Exam: Gen: Lethargic, without verbal responses resting in bed.  Eyes open but not tracking. CVS: Pulse regular tachycardia, S1 and S2 normal.  Right IJ TDC in place connected to CRRT Resp: Diminished breath sounds over bases, no distinct rales or rhonchi Abd: Soft, scaphoid, bowel sounds normal Ext: Status post bilateral BKA.  Trace hip edema noted.  Labs: BMET Recent Labs  Lab 02/27/24 0510 02/27/24 1600  02/28/24 0455 02/28/24 1517 02/29/24 0410 02/29/24 1600 03/01/24 0304  NA 133* 134* 136 133* 133*  133* 133* 134*  135  K 3.9 4.0 4.1 4.4 4.4  4.4 4.4 3.9  3.9  CL 101 101 102 99 100  100 101 102  103  CO2 23 24 25 24 24  25 24 24  25   GLUCOSE 86 162* 114* 172* 124*  125* 150* 88  87  BUN 14 19 16 20 19  17  25* 24*  24*  CREATININE 0.41* 0.51* 0.47* 0.45* 0.40*  0.40* 0.44* 0.45*  0.43*  CALCIUM  8.3* 8.1* 8.4* 8.0* 8.2*  8.3* 8.1* 8.1*  8.1*  PHOS 2.1* 2.0* 1.2* 3.8 2.2* 1.9* 1.8*   CBC Recent Labs  Lab 02/24/24 1816 02/25/24 0300 02/26/24 0541 02/27/24 0510 02/28/24 0455 02/28/24 0647 02/29/24 0410 03/01/24 0358  WBC 6.0   < > 6.8   < > 8.7 9.6 11.0* 0.8*  NEUTROABS 5.9  --  5.6  --   --  8.6*  --   --   HGB 8.2*   < > 8.6*   < > 7.4* 7.8* 7.3* 6.8*  HCT 23.9*   < > 25.6*   < > 22.1* 24.2* 22.1* 20.6*  MCV 93.7   < > 95.5   < > 97.4 97.6 97.4 96.3  PLT 37*   < > 42*   < > 17* 17* 21* 19*   < > = values in this interval not displayed.      Medications:     acetaminophen   1,000 mg Per Tube TID   Chlorhexidine  Gluconate Cloth  6 each Topical Daily   darbepoetin (ARANESP ) injection - DIALYSIS  150 mcg Subcutaneous Q Fri-1800   dexamethasone  (DECADRON ) injection  10 mg Intravenous Q24H   dolutegravir   50 mg Per Tube Daily   emtricitabine -tenofovir  AF  1 tablet Per Tube Daily   feeding supplement (PROSource TF20)  60 mL Per Tube Daily   gabapentin   100 mg Per Tube Q8H   gatifloxacin   1 drop Right Eye BID   methocarbamol   500 mg Per Tube TID   midodrine   10 mg Per Tube TID WC   multivitamin  1 tablet Per Tube QHS   pantoprazole  (PROTONIX ) IV  40 mg Intravenous Q12H   sodium chloride  flush  10-40 mL Intracatheter Q12H   Gordy Blanch, MD 03/01/2024, 8:48 AM

## 2024-03-01 NOTE — Progress Notes (Signed)
 Pharmacy Electrolyte Replacement  Recent Labs:  Recent Labs    03/01/24 0304  K 3.9  3.9  MG 2.3  PHOS 1.8*  CREATININE 0.45*  0.43*    Low Critical Values (K </= 2.5, Phos </= 1, Mg </= 1) Present: None  MD Contacted: N/a   Plan:  Give NaPhos 30 mmol IV x once per CRRT protocol.  Renal function panel ordered for 1600, will re-evaluate replacement needs.

## 2024-03-01 NOTE — Progress Notes (Addendum)
 NAMEBlakely Daniels, MRN:  [id number], DOB:  02-28-1971, LOS: 77 ADMISSION DATE:  12/14/2023, CONSULTATION DATE:  02/24/2024 REFERRING MD:  MELODIE, CHIEF COMPLAINT:  septic shock, respiratory failure   History of Present Illness:   Alvin Daniels is a 53 y.o. male with medical history significant of ESRD on HD MWF, type 2 diabetes mellitus poorly controlled, HIV, chronic hepatitis C, PAD s/p bilateral BKA, and recent admission for MRSA septicemia, possible IE, and L middle finger OM s/p amputation per Ortho surgery on 5/29 (cultures grew E. 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   Pt was in USOH until this past week when he had difficulty walking due to the pain in his R hip. The pain would bother him all day, but he was able to walk most of the day and rest in the evening. Yesterday, the pain was so bad that he was unable to walk at all, so he presented to the ED for further evaluation. Of note, pt was recently admitted for MRSA septicemia and possible IE and was continued on IV vancomycin  w/ HD until 7/19, and d/c recommended repeat TEE to eval for clearance of IE.   In the ED, pt AFVSS. Labs showed K 3.4, Cr 9.06, CRP 15.2, and ESR 133. MRI sacrum showed discitis and osteomyelitis at L4-5 with associated paraspinal inflammatory changes and right psoas Abscess. EDP ordered IV vancomycin /cefepime , and requested medicine admission.    Patient was brought into ICU because of increasing vasopressor needs.  He was eventually intubated.  Pertinent  Medical History  ESRD DM HIV Chronic Hep C PAD s/p bilateral BKA  Significant Hospital Events: Including procedures, antibiotic start and stop dates in addition to other pertinent events   02/21/2024 intubated for respiratory failure and vasopressor therapy was increasing during the day because of septic shock.  02/23/2024: proned at 10:30 and supinated  at 3 AM 02/24/2024: remains supinated, did well overnight. 02/25/2024: had family meeting with brother, heading to [place], interested in moving his body to hometown 02/26/2024: had a family meeting with 4 of his siblings and they decided to do a time limited trial of 48 hours. Will need to circle back with them on Saturday 02/28/24. 02/28/2024: Family avoiding meeting for goals of care 10/20: Self extubated.  Decision to make DNR/DNI.  Interim History / Subjective:   Patient unresponsive not following any commands on CRRT.  Respiratory rate stable. Self extubated yesterday and decision was made to make the patient DNR/DNI.  Objective    Blood pressure (!) 118/57, pulse (!) 105, temperature 98.8 F (37.1 C), resp. rate (!) 42, height 5' 2 (1.575 m), weight 34.6 kg, SpO2 97%.    Vent Mode: PRVC FiO2 (%):  [40 %] 40 % Set Rate:  [20 bmp] 20 bmp Vt Set:  [380 mL] 380 mL PEEP:  [8 cmH20] 8 cmH20 Plateau Pressure:  [12 cmH20-18 cmH20] 12 cmH20   Intake/Output Summary (Last 24 hours) at 03/01/2024 0724 Last data filed at 03/01/2024 0700 Gross per 24 hour  Intake 1278.89 ml  Output 245 ml  Net 1033.89 ml   Filed Weights   02/29/24 0500 03/01/24 0039 03/01/24 0115  Weight: 38.4 kg 34.6 kg 34.6 kg    Examination: Gen:   Cachectic with sunken eyeballs.  Nonresponsive. Neck:     ETT in place. Lungs:    CTA B. CV:  Regular rate and rhythm; no murmurs Abd:      + bowel sounds; soft, non-tender; no palpable masses, no distension Ext:    Bilateral BKA Neuro: Unresponsive  Checklist Labs, Imaging reviewed Hemoglobin down to 6.8 from 7.1.  WBC down to 0.8.  Resolved problem list   Assessment and Plan  Septic shock-likely septic in the setting of psoas abscess and discitis Sepsis present on admission Wean pressors as tolerated.  Still on levo 5. Continue midodrine , Decadron  for septic shock and ARDS.  Acute hypoxic respiratory failure: concern for ARDS now s/p 1 session of  proning with improved ventilator requirements. Self extubated on 10/19.  Currently on nasal cannula. Made DNR/DNI.  MRSA Discitis/Psoas Abscess: Continue vancomycin  for now.  Unable to do source control so will need long-term antibiotic Finished a course of Zosyn  on 10/17  End-stage renal disease Continue CRRT  Anemia of chronic disease present on admission Thrombocytopenia Neutropenia: Monitor CBC Hemoglobin 6.8 today.  Will hold off on transfusion for now.  However if continues to drop further we will transfuse 1 unit PRBC. Unclear cause for significant drop in WBC count from normal to 0.8.  Neutropenic precautions. Transfuse if platelets drop less than 10 or if there is evidence of active bleed.  Hypoglycemia Stop SSI. Not on any feeds. Change D5-D20.  Goals of care Code status is DNR/DNI. Patient with poor mental status and no good nutrition.  Will have to discuss with family regarding further goals as he will need a more permanent feeding tube if he does not turnaround.  #Stress Ulcer PPx: PPI #Nutrition: Cannot put NG because of low platelets.  Goals of care discussion. #VTE ppx: holding heparin  subQ due to thrombocytopenia.  Cannot do SCDs because of bilateral amputation.  CRITICAL CARE Performed by: Sammi JONETTA Fredericks.     Total critical care time: 35 minutes   Critical care time was exclusive of separately billable procedures and treating other patients.   Critical care was necessary to treat or prevent imminent or life-threatening deterioration.   Critical care was time spent personally by me on the following activities: development of treatment plan with patient and/or surrogate as well as nursing, discussions with consultants, evaluation of patient's response to treatment, examination of patient, obtaining history from patient or surrogate, ordering and performing treatments and interventions, ordering and review of laboratory studies, ordering and review of  radiographic studies, pulse oximetry, re-evaluation of patient's condition and participation in multidisciplinary rounds.  Sammi JONETTA Fredericks, MD Pulmonary, Critical Care and Sleep Attending.  Pager: [phone]  03/01/2024, 10:54 AM

## 2024-03-01 NOTE — TOC Progression Note (Addendum)
 Transition of Care [facility]) - Progression Note    Patient Details  Name: Alvin Daniels MRN: [id number] Date of Birth: September 10, 1970  [facility] CM/SW Contact  Tom-Johnson, Harvest Muskrat, RN Phone Number: 03/01/2024, 1:04 PM  Clinical Narrative:     Patient self extubated yesterday 03/01/24, on 4L O2. Patient not responsive at this time. On CRRT, Nephrology following. GOC continues with family and team.  M will continue to follow.       Expected Discharge Plan: Home/Self Care Barriers to Discharge: Continued Medical Work up, Inadequate or no insurance (does not have adequate home support in place)               Expected Discharge Plan and Services In-house Referral: Clinical Social Work     Living arrangements for the past 2 months: Single Family Home Expected Discharge Date: 02/02/24                                     Social Drivers of Health (SDOH) Interventions SDOH Screenings   Food Insecurity: No Food Insecurity (12/15/2023)  Housing: Low Risk  (12/15/2023)  Transportation Needs: No Transportation Needs (12/15/2023)  Utilities: Not At Risk (12/15/2023)  Alcohol  Screen: Low Risk  (07/17/2023)  Depression (PHQ2-9): Low Risk  (07/17/2023)  Financial Resource Strain: Low Risk  (01/29/2023)  Physical Activity: Inactive (01/29/2023)  Social Connections: Moderately Isolated (10/08/2023)  Stress: No Stress Concern Present (01/29/2023)  Tobacco Use: Low Risk  (02/19/2024)  Health Literacy: Adequate Health Literacy (01/29/2023)    Readmission Risk Interventions     No data to display

## 2024-03-01 NOTE — Progress Notes (Signed)
 Nutrition Follow-up  DOCUMENTATION CODES:  Severe malnutrition in context of chronic illness  INTERVENTION:  Consider Cortrak placement for enteral nutrition support versus PEG tube for more chronic/ongoing necessity for long term nutrition needs.  - per interdisciplinary rounds discussion; hold on placement of Cortrak d/t low platelets.  Monitor for goals of care  If tube feeding indicated, recommend resuming prior tube feed recommendations: Osmolite 1.5 at 45 ml/h (1080 ml per day) Prosource TF20 60 ml 1x/d Provides 1700 kcal, 87 gm protein, 823 ml free water daily  NUTRITION DIAGNOSIS:  Severe Malnutrition related to chronic illness as evidenced by severe muscle depletion, severe fat depletion. - remains applicable  GOAL:  Patient will meet greater than or equal to 90% of their needs - goal unmet  MONITOR:  PO intake, Supplement acceptance  REASON FOR ASSESSMENT:  Consult Enteral/tube feeding initiation and management, Assessment of nutrition requirement/status (CRRT)  ASSESSMENT:   Pt with PMH significant for: ESRD-HD, HIV, PAD s/p bilateral BKA, chronic HCV. Of note, recently admiteed 5/28 - 6/06 for MRSA bacteremia with left fourth finger osteomyelitis (amputation 5/29). Presenting to hospital w/ R hip pain and found to have L4-L5 osteomyelitis with abscess.  8/4 - admitted to [facility] 10/11 - rapid response called for hypoglycemia and hypotension, transferred to ICU, intubated 10/12 - pt self extubated and was reintubated within 20 minutes, CRRT being initiated  10/13 - proned d/t concern for ARDS; NPO to LIS d/t distension 10/14 - resume trickles  10/19 - self extubated  Patient discussed in interdisciplinary rounds.  Pt not very responsive. Remains on CRRT.   Palliative care continues to follow to support goals of care discussions.  Plan for family meeting tomorrow 10/21.   Per CCM, hold on NGT placement d/t low platelets. Pt would likely require more  permanent enteral nutrition access if family desires for ongoing medical management. D20 added today to provide kcal from dextrose  to aid in low blood sugar management.   Admit weight: 47.6 kg Current weight: 34.6 kg  Medications: decadron , tivicay , descovy Drips: Levo @ 2 mcg/min  Labs:  Sodium 134 BUN 24 Cr 0.45 Phosphorus 1.8 - repletion ordered Albumin  1.7 CBG's 37-124 x24 hours  Diet Order:   Diet Order             Diet NPO time specified  Diet effective now                   EDUCATION NEEDS:   Education needs have been addressed  Skin:  Skin Assessment: Skin Integrity Issues: Skin Integrity Issues:: Stage II Stage II: sacrum  Last BM:  10/20 50ml via rectal tube  Height:   Ht Readings from Last 1 Encounters:  02/22/24 5' 2 (1.575 m)    Weight:   Wt Readings from Last 1 Encounters:  03/01/24 34.6 kg    Ideal Body Weight:  47.3 kg (adjusted for bilateral BKA (11.8%))  BMI:  Body mass index is 13.95 kg/m.  Estimated Nutritional Needs:   Kcal:  1600-1800 kcal/d  Protein:  80-100g/d  Fluid:  1 L + UOP  Royce Maris, RDN, LDN Clinical Nutrition See AMiON for contact information.

## 2024-03-01 NOTE — Progress Notes (Signed)
 eLink Physician-Brief Progress Note Patient Name: Alvin Daniels DOB: October 25, 1970 MRN: [id number]   Date of Service  03/01/2024  HPI/Events of Note  Received request to increase CBG monitoring frequency while on D20 Currently monitoring ACHS Ongoing CRRT  eICU Interventions  Increased CBG monitoring to q4     Intervention Category Intermediate Interventions: Diagnostic test evaluation  Damien ONEIDA Grout 03/01/2024, 9:39 PM

## 2024-03-01 NOTE — Progress Notes (Signed)
 Daily Progress Note   Patient Name: Alvin Daniels       Date: 03/01/2024 DOB: [birth year]  Age: 53 y.o. MRN#: [id number] Attending Physician: Theodoro Lakes, MD Primary Care Physician: Pcp, No Admit Date: 12/14/2023  Reason for Consultation/Follow-up: Establishing goals of care  Subjective: Medical records reviewed including progress notes, labs, imaging. Patient assessed at the bedside. He remains intubated and on CRRT, receiving personal care from nursing. No visitors were present.   Called patient's brother Alvin Daniels to follow up on goals of care conversations and provide palliative support. I left a voicemail and he was able to return my call shortly after.   We discussed interval history since our last conversation. He shares that he is still having a very hard time processing patient's life-threatening illness and that he is always thinking about what to do. His brothers are having quite a hard time as well. Alvin Daniels will be present tomorrow morning at 9 am for another meeting with the doctor. I shared that I would certainly be present to continue supporting and assisting them as they navigate complex medical decision making. Emotional support and therapeutic listening was provided. Ensured he has PMT phone number for any questions or concerns as they arise, recognizing that it can be difficult to think of what might be helpful to discuss in the middle of an interaction. He was very Adult nurse.  Questions and concerns addressed. PMT will continue to support holistically.   Length of Stay: 94   Physical Exam Vitals and nursing note reviewed.  Constitutional:      Appearance: He is cachectic. He is ill-appearing.     Interventions: He is intubated.  HENT:     Head: Normocephalic and atraumatic.  Cardiovascular:     Rate  and Rhythm: Normal rate.  Pulmonary:     Effort: He is intubated.  Skin:    General: Skin is warm and dry.  Neurological:     Comments: Sedated            Vital Signs: BP (!) 118/57   Pulse (!) 102   Temp (!) 97.5 F (36.4 C)   Resp (!) 21   Ht 5' 2 (1.575 m)   Wt 34.6 kg   SpO2 98%   BMI 13.95 kg/m  SpO2: SpO2: 98 % O2 Device: O2 Device: Nasal Cannula O2 Flow Rate: O2 Flow Rate (L/min): 4 L/min      Palliative Assessment/Data: 10%   Palliative Care Assessment & Plan   Patient Profile: Palliative Care consult requested for goals of care discussion in this 53 y.o. male  with past medical history of  ESRD on HD MWF, poorly controlled diabetes type 2, chronic hepatitis C, HIV, PAD s/p bilateral BKA, MRSA septicemia, left middle finger osteomyelitis s/p amputation, and Streptococcus anginosus with right hip pain, right psoas abscess consistent with L4-5 osteomyelitis on MRI.  He was admitted on 12/14/2023 from home with inability to walk due to uncontrolled pain.  During workup MRI of sacrum showed discitis and osteomyelitis of L4-5 with associated paraspinal inflammatory changes and right psoas mass.  Patient started on IV vancomycin  and cefepime .   Assessment: Goals of care conversation End-stage renal disease on CRRT Acute hypoxic respiratory failure with concern for ARDS, improving after proning Septic shock MRSA psoas abscess and discitis PAD s/p bilateral BKA's  Recommendations/Plan: Continue DNR/DNI Follow up family meeting tomorrow 10/21 at 9am for continued goals of care conversation Psychosocial and emotional support provided PMT will continue to follow and support  Prognosis: Very poor  Discharge Planning: Anticipated Hospital Death  Care plan was discussed with patient's brother [name], MD, RN         Ramina Hulet SHAUNNA Fell, PA-C  Palliative Medicine Team Team phone # [phone]  Thank you for allowing the Palliative Medicine Team to assist in the care  of this patient. Please utilize secure chat with additional questions, if there is no response within 30 minutes please call the above phone number.  Palliative Medicine Team providers are available by phone from 7am to 7pm daily and can be reached through the team cell phone.  Should this patient require assistance outside of these hours, please call the patient's attending physician.     Time Total: 35 minutes  Visit consisted of counseling and education dealing with the complex and emotionally intense issues of symptom management and palliative care in the setting of serious and potentially life-threatening illness. Greater than 50% of this time was spent counseling and coordinating care related to the above assessment and plan.  Personally spent 35 minutes in patient care including extensive chart review (labs, imaging, progress/consult notes, vital signs), medically appropraite exam, discussed with treatment team, education to patient, family, and staff, documenting clinical information, medication review and management, coordination of care, and available advanced directive documents.

## 2024-03-01 NOTE — Consult Note (Signed)
 WOC Nurse wound follow up Wound type: Deep pressure injury on sacrum/buttocks. Measurement: 2 open wounds, shallow, 100% red, one on coccyx and a second on left buttock. 1 cm x 0.5 cm x 0.1 cm each. Wound bed: 100% red. Drainage (amount, consistency, odor) Minimum, serous. Periwound: DTI purple skin intact. Pt is using a rectum pouch.  Dressing procedure/placement/frequency: Cleanse with warm water or saline. Pat dry. Apply a single layer of Xeroform to the wound beds. Cover with foam dressing. Change daily or PRN.     Partial thickness device related on inner thing and perineum. Measurement on left: 2 cm x 3.3 cm x 0.1 cm, C shape. Measurement on Right inner thigh: 5 cm x 3 cm x 0.1 cm. Measurement on scrotum: 1 cm x 0.5 cm x 0.1 cm. Wound bed: 100% pink red. Minimum drainage, serous. Dressing procedure/placement/frequency: Cleanse with warm water or saline. Pat dry. Apply a single layer of Xeroform to the wound beds. Cover with foam dressing. Change daily or PRN.     WOC team will follow weekly. Please reconsult if further assistance is needed. Thank-you,  Lela Holm RN, CNS, [employer], MSN.  (Phone [phone])

## 2024-03-01 NOTE — Evaluation (Signed)
 RT Evaluate and Treat Note  03/01/2024   Breathing is (select one): Same as normal    The following was found on auscultation (select multiple):  Bilateral Breath Sounds: Rhonchi;Diminished (03/01/24 0400)  R Upper  Breath Sounds: Clear (02/29/24 1600) L Upper Breath Sounds: Clear (02/29/24 1600) R Lower Breath Sounds: Diminished (02/29/24 1600) L Lower Breath Sounds: Diminished (02/29/24 1600)    Cough Assessment: Cough: None (02/28/24 1600)    Most Recent Chest Xray:... (No results found.    The following medications and/or interventions were ordered/changed/discontinued as part of the Respiratory Treatment protocol:   Medication Changes: no changes   Airway Clearance Changes: no changes   Oxygen Therapy Changes: no changes

## 2024-03-01 NOTE — Progress Notes (Addendum)
 eLink Physician-Brief Progress Note Patient Name: Alvin Daniels DOB: 1970/08/28 MRN: [id number]   Date of Service  03/01/2024  HPI/Events of Note  asking for continuous  dext with CBG of 52 that was tx with a amp of D50       asking for prn pain med iv,   rectal pouch on and no OG/NG since self extubation yesterday  Cr normal.   eICU Interventions  Start d5 at 40 ml/hr.      Intervention Category Intermediate Interventions: Other:  Jodelle [name] 03/01/2024, 12:58 AM  01:33   asking for prn pain med iv, rectal pouch on and no OG/NG since self extubation yesterday. Cr normal. Plt at 21K, hg at 7.3. on room air. Mentation- protecting airways. Ast/alt from 16 th ok  - IV tylenol  ordered Discussed with RN.   05:01 WBC with Hgb 6.8     WBC 0.8 compared to 11 the day before and Plt's 19         BSRN is going to draw lab again with your permission - repeat CBC from peripheral stick, if possible. Pancytopenia, not new.   05:13 drop in BP now currently 100/46 (65)  HR 90  BSRN asking to reorder the quad st levo - guarded prognosis per chart review. - Quad strength levo ordered.

## 2024-03-01 NOTE — Progress Notes (Addendum)
 Hypoglycemic Event  CBG: 52  Treatment: D50 50 mL (25 gm)  Symptoms: None  Follow-up CBG: Time:0109 CBG Result:118  Possible Reasons for Event: Other: pt self-extubated today and also pulled out OGT that had tube feeds running.  Comments/MD notified: Hypoglycemic Protocol followed and Elink notified. Order placed for D5 drip at 40 mL/hr.    Alvin Daniels

## 2024-03-02 LAB — CBC
HCT: 20.6 % — ABNORMAL LOW (ref 39.0–52.0)
HCT: 17.1 % — ABNORMAL LOW (ref 39.0–52.0)
Hemoglobin: 6.8 g/dL — CL (ref 13.0–17.0)
Hemoglobin: 5.8 g/dL — CL (ref 13.0–17.0)
MCH: 31.8 pg (ref 26.0–34.0)
MCH: 32.2 pg (ref 26.0–34.0)
MCHC: 33 g/dL (ref 30.0–36.0)
MCHC: 33.9 g/dL (ref 30.0–36.0)
MCV: 96.3 fL (ref 80.0–100.0)
MCV: 95 fL (ref 80.0–100.0)
Platelets: 19 K/uL — CL (ref 150–400)
Platelets: 18 K/uL — CL (ref 150–400)
RBC: 2.14 MIL/uL — ABNORMAL LOW (ref 4.22–5.81)
RBC: 1.8 MIL/uL — ABNORMAL LOW (ref 4.22–5.81)
RDW: 18.8 % — ABNORMAL HIGH (ref 11.5–15.5)
RDW: 19.1 % — ABNORMAL HIGH (ref 11.5–15.5)
WBC: 0.8 K/uL — CL (ref 4.0–10.5)
WBC: 4.7 K/uL (ref 4.0–10.5)
nRBC: 0 % (ref 0.0–0.2)
nRBC: 0 % (ref 0.0–0.2)

## 2024-03-02 LAB — RENAL FUNCTION PANEL
Albumin: <1.5 g/dL — ABNORMAL LOW (ref 3.5–5.0)
Anion gap: 7 (ref 5–15)
BUN: 17 mg/dL (ref 6–20)
CO2: 22 mmol/L (ref 22–32)
Calcium: 7.6 mg/dL — ABNORMAL LOW (ref 8.9–10.3)
Chloride: 104 mmol/L (ref 98–111)
Creatinine, Ser: 0.44 mg/dL — ABNORMAL LOW (ref 0.61–1.24)
GFR, Estimated: >60 mL/min (ref >60)
Glucose, Bld: 127 mg/dL — ABNORMAL HIGH (ref 70–99)
Phosphorus: 1.3 mg/dL — ABNORMAL LOW (ref 2.5–4.6)
Potassium: 3.4 mmol/L — ABNORMAL LOW (ref 3.5–5.1)
Sodium: 133 mmol/L — ABNORMAL LOW (ref 135–145)

## 2024-03-02 LAB — MAGNESIUM: Magnesium: 2.2 mg/dL (ref 1.7–2.4)

## 2024-03-02 LAB — GLUCOSE, CAPILLARY
Glucose-Capillary: 125 mg/dL — ABNORMAL HIGH (ref 70–99)
Glucose-Capillary: 129 mg/dL — ABNORMAL HIGH (ref 70–99)

## 2024-03-02 MED ORDER — ACETAMINOPHEN 325 MG PO TABS: 650.0000 mg | ORAL_TABLET | Freq: Four times a day (QID) | ORAL | Status: DC | PRN

## 2024-03-02 MED ORDER — HYDROMORPHONE BOLUS VIA INFUSION: 1.0000 mg | INTRAVENOUS | Status: DC | PRN

## 2024-03-02 MED ORDER — HYDROMORPHONE HCL 1 MG/ML IJ SOLN
1.0000 mg | INTRAMUSCULAR | Status: DC | PRN
  Administered 2024-03-02: 1 mg via INTRAVENOUS
  Filled 2024-03-02: qty 1

## 2024-03-02 MED ORDER — GLYCOPYRROLATE 1 MG PO TABS: 1.0000 mg | ORAL_TABLET | ORAL | Status: DC | PRN

## 2024-03-02 MED ORDER — SODIUM CHLORIDE 0.9 % IV SOLN: INTRAVENOUS | Status: DC

## 2024-03-02 MED ORDER — ACETAMINOPHEN 650 MG RE SUPP: 650.0000 mg | Freq: Four times a day (QID) | RECTAL | Status: DC | PRN

## 2024-03-02 MED ORDER — GLYCOPYRROLATE 0.2 MG/ML IJ SOLN: 0.2000 mg | INTRAMUSCULAR | Status: DC | PRN

## 2024-03-02 MED ORDER — LORAZEPAM 2 MG/ML IJ SOLN: 2.0000 mg | INTRAMUSCULAR | Status: DC | PRN

## 2024-03-02 MED ORDER — POLYVINYL ALCOHOL 1.4 % OP SOLN: 1.0000 [drp] | Freq: Four times a day (QID) | OPHTHALMIC | Status: DC | PRN

## 2024-03-02 MED ORDER — SODIUM PHOSPHATES 45 MMOLE/15ML IV SOLN
30.0000 mmol | Freq: Once | INTRAVENOUS | Status: DC
  Administered 2024-03-02: 30 mmol via INTRAVENOUS
  Filled 2024-03-02: qty 10

## 2024-03-02 MED ORDER — SODIUM CHLORIDE 0.9% IV SOLUTION: Freq: Once | INTRAVENOUS | Status: DC

## 2024-03-02 MED ORDER — HYDROMORPHONE HCL-NACL 50-0.9 MG/50ML-% IV SOLN
1.0000 mg/h | INTRAVENOUS | Status: DC
  Filled 2024-03-02: qty 50

## 2024-03-03 NOTE — Progress Notes (Signed)
 Late note entry 2024-03-13 0930 Contacted out-pt HD clinic, [facility], to inform of pt passing. No further assistance needed at this time.   Jahaan Vanwagner [facility] [phone]

## 2024-03-13 NOTE — Progress Notes (Signed)
eLink Physician-Brief Progress Note Patient Name: Tramayne Montavon DOB: 11/03/1970 MRN: [id number]   Date of Service  03/07/2024  HPI/Events of Note  53 y.o. male with medical history significant of ESRD on HD MWF, type 2 diabetes mellitus poorly controlled, HIV, chronic hepatitis C, PAD s/p bilateral BKA, and recent admission for MRSA septicemia, possible IE, and L middle finger OM s/p amputation per Ortho surgery on 5/29 (cultures grew E. 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  Transition to comfort care earlier in the day.  eICU Interventions  TOD 2027     Intervention Category Minor Interventions: Routine modifications to care plan (e.g. PRN medications for pain, fever)  Amarachi Kotz 03/09/2024, 8:48 PM

## 2024-03-13 NOTE — Progress Notes (Signed)
 Patient ID: Alvin Daniels, male   DOB: 18-Mar-1971, 53 y.o.   MRN: [id number] Meeting held between critical care service and the patient's family earlier this morning.  The decision has been appropriately made to discontinue CRRT/life-sustaining measures and to transition towards comfort measures only in this patient with multiple medical problems and limited reserve function.  Nephrology will sign off at this time and remain available for questions/concerns.  Gordy Blanch MD [facility]. Office # [phone] Pager # [phone] 9:43 AM

## 2024-03-13 NOTE — Progress Notes (Addendum)
Daily Progress Note   Patient Name: Alvin Daniels       Date: 02/12/2024 DOB: [birth year]  Age: 53 y.o. MRN#: [id number] Attending Physician: Theodoro Lakes, MD Primary Care Physician: Pcp, No Admit Date: 12/14/2023  Reason for Consultation/Follow-up: Establishing goals of care  Subjective: Medical records reviewed including progress notes, labs, imaging. Patient assessed at the bedside.  He remains unresponsive to his environment.  Patient's 2 brothers, sister-in-law, and friend arrive for scheduled family meeting and relocated to waiting room with primary attending and translator.  Created space and opportunity for patient family's thoughts and feelings on patient's current illness.  Primary attending provided medical update and I shared my agreement with patient's poor long-term prognosis with or without medical interventions, as well as concern for the likelihood of increased suffering with these interventions continuing.  Family are all understanding that he is not improving, noting that he had some interactions with them on Sunday but none since then.  They feel his quality of life is not acceptable and they do not want him to suffer.    Recommended comfort focused care, reviewing that patient would no longer receive aggressive medical interventions such as continuous vital signs, lab work, radiology testing, or medications not focused on comfort. All care would focus on how the patient is looking and feeling. This would include management of any symptoms that may cause discomfort, pain, shortness of breath, cough, nausea, agitation, anxiety, and/or secretions etc. Symptoms would be managed with medications and other non-pharmacological interventions such as spiritual support if requested, repositioning, music therapy, or  therapeutic listening. Family verbalized understanding and appreciation. We reviewed likelihood of prognosis being hours to days, possibility of hospice facility if he lingers, and resources available for their interest in transporting his body to [place] as his final resting place.  Emotional support and therapeutic listening was provided.  Questions and concerns addressed. PMT will continue to support holistically.   Length of Stay: 95   Physical Exam Vitals and nursing note reviewed.  Constitutional:      Appearance: He is cachectic. He is ill-appearing.  HENT:     Head: Normocephalic and atraumatic.  Cardiovascular:     Rate and Rhythm: Normal rate.  Pulmonary:     Effort: Tachypnea present.  Skin:    General: Skin is warm and dry.  Neurological:     Mental Status: He is unresponsive.            Vital Signs: BP (!) 118/57   Pulse 94   Temp 98.2 F (36.8 C) (Axillary)   Resp (!) 24   Ht 5' 2 (1.575  m)   Wt 40.7 kg   SpO2 94%   BMI 16.41 kg/m  SpO2: SpO2: 94 % O2 Device: O2 Device: Room Air O2 Flow Rate: O2 Flow Rate (L/min): 2 L/min      Palliative Assessment/Data: 10%   Palliative Care Assessment & Plan   Patient Profile: Palliative Care consult requested for goals of care discussion in this 53 y.o. 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  He was admitted on 12/14/2023 from home with inability to walk due to uncontrolled pain.  During workup MRI of sacrum showed discitis and osteomyelitis of L4-5 with associated paraspinal inflammatory changes and right psoas mass.  Patient started on IV vancomycin  and cefepime .   Assessment: Goals of care conversation End-stage renal disease on CRRT Acute hypoxic  respiratory failure with concern for ARDS, improving after proning Septic shock MRSA psoas abscess and discitis PAD s/p bilateral BKA's  Recommendations/Plan: Continue DNR/DNI Transition to comfort focused care today Dilaudid  PRN for pain/air hunger/comfort Robinul  PRN for excessive secretions Ativan PRN for agitation/anxiety Zofran  PRN for nausea Liquifilm tears PRN for dry eyes Comfort cart for family Unrestricted visitations in the setting of EOL (per policy) Oxygen PRN 2L or less for comfort. No escalation.  Psychosocial and emotional support provided PMT will continue to follow and support  Prognosis: Very poor, anticipate hours to days  Discharge Planning: Anticipated Hospital Death  Care plan was discussed with patient's brothers sister-in-law and friend, MD, RN, interpreter         Mickle SHAUNNA Fell, PA-C  Palliative Medicine Team Team phone # [phone]  Thank you for allowing the Palliative Medicine Team to assist in the care of this patient. Please utilize secure chat with additional questions, if there is no response within 30 minutes please call the above phone number.  Palliative Medicine Team providers are available by phone from 7am to 7pm daily and can be reached through the team cell phone.  Should this patient require assistance outside of these hours, please call the patient's attending physician.    MDM: High  Problems Addressed: One acute or chronic illness or injury that poses a threat to life or bodily function  Amount and/or Complexity of Data: Category 1:Review of prior external note(s) from each unique source, Review of the result(s) of each unique test, and Assessment requiring an independent historian(s), Category 2:Independent interpretation of a test performed by another physician/other qualified health care professional (not separately reported), and Category 3:Discussion of management or test interpretation with external physician/other  qualified health care professional/appropriate source (not separately reported)  Risks: Decision not to resuscitate or to de-escalate care because of poor prognosis

## 2024-03-13 NOTE — Progress Notes (Signed)
 Nutrition Brief Note  Chart reviewed. Pt now transitioning to comfort care.  No further nutrition interventions planned at this time.  Please re-consult as needed.   Drusilla Kanner, RDN, LDN Clinical Nutrition See AMiON for contact information.

## 2024-03-13 NOTE — IPAL (Signed)
  Interdisciplinary Goals of Care Family Meeting   Date carried out: 02/20/2024  Location of the meeting: Conference room  Member's involved: Physician, Social Worker, Family Member or next of kin, Palliative care team member, and Other: Nurse, learning disability. Family members present were brother [name], youngest brother, Daphine wife and friend.   Durable Power of Insurance risk surveyor: Facilities manager.     Discussion: We discussed goals of care for Alvin Daniels .   I briefly went over the patient's care.  I went over the interventions that had been done on him in the hospital as well as the futility of these interventions and the fact that he was not getting better. [name] and other family members understood this.  They want him to be comfortable and do not want to suffer anymore.  They are open to taking him to a hospice facility if he survives in the hospital longer.  Their final wishes are to take his body back to [place] if possible which the palliative care providers are helping me facilitate along with talking to the patient.  Code status:   Code Status: Do not attempt resuscitation (DNR) - Comfort care   Disposition: In-patient comfort care for now.  If patient does not survive longer will transition home to hospice.  Time spent for the meeting: 25 min    [name], MD  02/28/2024, 9:38 AM

## 2024-03-13 NOTE — Evaluation (Signed)
RT Evaluate and Treat Note  03/10/2024   Breathing is (select one): Same as normal    The following was found on auscultation (select multiple):  Bilateral Breath Sounds: Clear;Diminished (03/11/2024 0800)  R Upper  Breath Sounds: Clear (03/01/24 2000) L Upper Breath Sounds: Clear (02/29/24 1600) R Lower Breath Sounds: Diminished (02/29/24 1600) L Lower Breath Sounds: Diminished (02/29/24 1600)    Cough Assessment: Cough: None (02/28/24 1600)    Most Recent Chest Xray:... (No results found.    The following medications and/or interventions were ordered/changed/discontinued as part of the Respiratory Treatment protocol:   Medication Changes: no changes   Airway Clearance Changes: no changes   Oxygen Therapy Changes: no changes

## 2024-03-13 NOTE — Death Summary Note (Signed)
 DEATH SUMMARY   Patient Details  Name: Alvin Daniels MRN: [id number] DOB: [birth year] PCP:Pcp, No  Admission/Discharge Information   Admit Date:  Jan 03, 2024  Date of Death: Date of Death: 03/22/24  Time of Death: Time of Death: 03/28/2026  Length of Stay: 03-28-78   Principle Cause of death: Septic shock  Hospital Diagnoses: 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)   Severe protein-calorie malnutrition   Normocytic anemia   ESRD on hemodialysis ([name])   S/P bilateral BKA (below knee amputation) ([name])   Chronic hepatitis C without hepatic coma ([name])   HIV (human immunodeficiency virus infection) ([name])   Hx MRSA infection   Septic shock ([name])   Acute osteomyelitis of lumbar spine ([name])   Epidural abscess   Paraspinal abscess ([name])   Pyogenic inflammation of bone ([name])   Pressure injury of skin   Hospital Course: 53 year old male who has had prolonged hospital stay in 03-28-24 for MRSA septicemia left middle finger osteomyelitis status post amputation presented with right hip pain and was found to have right psoas abscess complicated by L4-5 osteomyelitis on MRI.  He was diagnosed with septic shock and was able to be weaned off of pressors.  In addition he was diagnosed with acute hypoxic respiratory failure secondary to ARDS and was intubated and subsequently self extubated.  He was having failure to thrive and was not very responsive.  He was not awake enough to feed by himself.  Family meeting was helped and the decision was made to make the patient comfort only.  Assessment and Plan: The following note was the plan on the patient on the day of his death.  We had a family meeting today.  His brother [name] and his other younger brother was present along with Mariana's family.  Decision was made to make the patient comfort care only.   Septic shock-likely septic in the setting of psoas abscess  and discitis Sepsis present on admission Was able to be weaned off of pressors today. Stop midodrine .   Acute hypoxic respiratory failure: concern for ARDS now s/p 1 session of proning with improved ventilator requirements. Self extubated on 10/19.   Now comfort care.   MRSA Discitis/Psoas Abscess: Finished a course of Zosyn  on 10/17.  Vancomycin  stopped 10/21-comfort care.   End-stage renal disease Stop CRRT, patient is anuric.  Patient made comfort care.   Anemia of chronic disease present on admission Thrombocytopenia Neutropenia: Initial plan was to transfuse patient hemoglobin which was stopped after patient was made comfort care.  He did not receive PRBC.   Hypoglycemia D 20 infusion stopped as patient made comfort care.   Hypokalemia: - Replacement was ordered.   Goals of care Made comfort care.  Comfort care order set initiated.  Started on opiates and benzos for pain and anxiety. Considering his labs and his overall status, it is expected that patient would pass away peacefully fairly quickly.  If patient still appears stable in the afternoon or in the evening will transfer the patient to the floor.      The results of significant diagnostics from this hospitalization (including imaging, microbiology, ancillary and laboratory) are listed below for reference.   Significant Diagnostic Studies: US  EKG SITE RITE Result Date: 02/28/2024 If Site Rite image not attached, placement could not be confirmed due to current cardiac rhythm.  DG Abd 1 View Result Date: 02/25/2024 EXAM: 1 VIEW XRAY OF THE ABDOMEN 02/25/2024 02:03:00  PM COMPARISON: None available. CLINICAL HISTORY: Encounter for imaging study to confirm orogastric (OG) tube placement. Encounter for OG tube placement, unable to fully straighten patient for imaging. FINDINGS: LINES, TUBES AND DEVICES: Nasogastric tube tip is seen in proximal stomach. BOWEL: Mild gastric distention. Small bowel dilatation is noted  concerning for obstruction or ileus. SOFT TISSUES: No opaque urinary calculi. BONES: No acute osseous abnormality. IMPRESSION: 1. Small bowel dilatation, suspicious for obstruction versus ileus. 2. Nasogastric tube tip in the proximal stomach. Mild gastric distention. Electronically signed by: Lynwood Seip MD 02/25/2024 02:14 PM EDT RP Workstation: [id number]   DG CHEST PORT 1 VIEW Result Date: 02/24/2024 EXAM: 1 VIEW XRAY OF THE CHEST 02/24/2024 09:39:00 AM COMPARISON: None available. CLINICAL HISTORY: 200808 Hypoxia 200808. Re-positioned ETT Hypoxia 200808. Re-positioned ETT FINDINGS: LINES, TUBES AND DEVICES: Endotracheal tube is in grossly good position. Nasogastric tube tip is seen in the proximal stomach. Right internal jugular catheter is unchanged and stable. LUNGS AND PLEURA: Central pulmonary vascular congestion is noted. No focal pulmonary opacity. No pleural effusion. No pneumothorax. HEART AND MEDIASTINUM: No acute abnormality of the cardiac and mediastinal silhouettes. BONES AND SOFT TISSUES: No acute osseous abnormality. IMPRESSION: 1. Stable central pulmonary vascular congestion. 2. Endotracheal tube in appropriate position. 3. Nasogastric tube tip projects over the proximal stomach. 4. Right internal jugular catheter unchanged. Electronically signed by: Lynwood Seip MD 02/24/2024 10:15 AM EDT RP Workstation: [id number]   DG CHEST PORT 1 VIEW Addendum Date: 02/24/2024 ADDENDUM #1 ** ADDENDUM: Dr. Catherine acknowledged finding of right mainstem intubation and repositioning ET tube at time of initial report. ---------------------------------------------------- Electronically signed by: Donnice Mania MD 02/24/2024 09:25 AM EDT RP Workstation: [id number]   Result Date: 02/24/2024 ** ORIGINAL REPORT ** EXAM: 1 VIEW(S) XRAY OF THE CHEST 02/24/2024 08:22:21 AM COMPARISON: 02/22/2024 CLINICAL HISTORY: ETT,HX ESRD, 042/AIDS,DM FINDINGS: LINES, TUBES AND DEVICES: Endotracheal tube with tip in the right  mainstem bronchus recommend urgent repositioning. Enteric tube in place with tip and side port overlying the expected region of the gastric lumen. Right chest wall dialysis catheter in place with tip overlying the right atrium. LUNGS AND PLEURA: Diffuse interstitial prominence. Decreased bilateral patchy airspace opacities. Costophrenic angles are incompletely visualized there is no evidence of large pleural effusion. No pneumothorax. No pulmonary edema. HEART AND MEDIASTINUM: No acute abnormality of the cardiac and mediastinal silhouettes. BONES AND SOFT TISSUES: No acute osseous abnormality. IMPRESSION: 1. Endotracheal tube tip in the right mainstem bronchus, recommend urgent repositioning. 2. Decreased bilateral patchy airspace opacities. 3. Central pulmonary vascular congestion. Electronically signed by: Donnice Mania MD 02/24/2024 08:35 AM EDT RP Workstation: [id number]   ECHOCARDIOGRAM COMPLETE Result Date: 02/23/2024    ECHOCARDIOGRAM REPORT   Patient Name:   Alvin Daniels Date of Exam: 02/23/2024 Medical Rec #:  [id number]                       Height:       62.0 in Accession #:    [id number]                      Weight:       107.8 lb Date of Birth:  1970-11-07                        BSA:          1.470 m Patient Age:    13 years  BP:           126/62 mmHg Patient Gender: M                               HR:           98 bpm. Exam Location:  Inpatient Procedure: 2D Echo, Cardiac Doppler and Color Doppler (Both Spectral and Color            Flow Doppler were utilized during procedure). Indications:    Sepsis [facility])  History:        Patient has prior history of Echocardiogram examinations, most                 recent 10/15/2023. Risk Factors:Diabetes.  Sonographer:    Jayson Gaskins Referring Phys: [phone] WHITNEY D HARRIS IMPRESSIONS  1. Left ventricular ejection fraction, by estimation, is 50 to 55%. The left ventricle has low normal function. The left ventricle has no  regional wall motion abnormalities.  2. Right ventricular systolic function is hyperdynamic. The right ventricular size is normal.  3. Small echogenic density (apical 4 chamber view image 43) within the posterior wall of the right atrium, appears calcified, measures (1.55 x 0.44cm).  4. A small pericardial effusion is present. The pericardial effusion is surrounding the apex and anterior to the right ventricle. There is no evidence of cardiac tamponade.  5. The mitral valve is degenerative. Mild mitral valve regurgitation.  6. The tricuspid valve is abnormal. Tricuspid valve regurgitation is moderate to severe.  7. The aortic valve is tricuspid. Aortic valve regurgitation is not visualized. Aortic valve sclerosis is present, with no evidence of aortic valve stenosis.  8. The inferior vena cava is normal in size with <50% respiratory variability, suggesting right atrial pressure of 8 mmHg. Comparison(s): A prior study was performed on 10/15/2023. Right atrium findings are still present but smaller in size, appears more calcified, less mobile. Prior TEE reported: LVEF 60-65%, 1.8x1.2 mass on posterior right atrial wall, partially mobile. Conclusion(s)/Recommendation(s): Consider TEE if clinically indicated. FINDINGS  Left Ventricle: Left ventricular ejection fraction, by estimation, is 50 to 55%. The left ventricle has low normal function. The left ventricle has no regional wall motion abnormalities. The left ventricular internal cavity size was normal in size. There is no left ventricular hypertrophy. Right Ventricle: The right ventricular size is normal. No increase in right ventricular wall thickness. Right ventricular systolic function is hyperdynamic. Left Atrium: Left atrial size was normal in size. Right Atrium: Small echogenic density (apical 4 chamber view image 43) within the posterior wall of the right atrium, appears calcified, measures (1.55 x 0.44cm). Right atrial size was normal in size. Pericardium: A  small pericardial effusion is present. The pericardial effusion is surrounding the apex and anterior to the right ventricle. There is no evidence of cardiac tamponade. Mitral Valve: The mitral valve is degenerative in appearance. Mild mitral annular calcification. Mild mitral valve regurgitation. Tricuspid Valve: The tricuspid valve is abnormal. Tricuspid valve regurgitation is moderate to severe. Aortic Valve: The aortic valve is tricuspid. Aortic valve regurgitation is not visualized. Aortic valve sclerosis is present, with no evidence of aortic valve stenosis. Aortic valve mean gradient measures 3.0 mmHg. Aortic valve peak gradient measures 5.0  mmHg. Aortic valve area, by VTI measures 2.12 cm. Pulmonic Valve: The pulmonic valve was grossly normal. Pulmonic valve regurgitation is trivial. Aorta: The aortic root is normal in size and structure. Venous: The inferior  vena cava is normal in size with less than 50% respiratory variability, suggesting right atrial pressure of 8 mmHg. IAS/Shunts: No atrial level shunt detected by color flow Doppler.  LEFT VENTRICLE PLAX 2D LVIDd:         4.20 cm   Diastology LVIDs:         2.50 cm   LV e' medial:    8.70 cm/s LV PW:         0.50 cm   LV E/e' medial:  7.2 LV IVS:        0.60 cm   LV e' lateral:   10.10 cm/s LVOT diam:     1.80 cm   LV E/e' lateral: 6.2 LV SV:         31 LV SV Index:   21 LVOT Area:     2.54 cm  RIGHT VENTRICLE RV S prime:     12.40 cm/s TAPSE (M-mode): 1.1 cm LEFT ATRIUM             Index        RIGHT ATRIUM           Index LA Vol (A2C):   29.3 ml 19.93 ml/m  RA Area:     11.10 cm LA Vol (A4C):   34.7 ml 23.60 ml/m  RA Volume:   26.20 ml  17.82 ml/m LA Biplane Vol: 35.1 ml 23.88 ml/m  AORTIC VALVE AV Area (Vmax):    1.90 cm AV Area (Vmean):   2.01 cm AV Area (VTI):     2.12 cm AV Vmax:           112.00 cm/s AV Vmean:          78.200 cm/s AV VTI:            0.145 m AV Peak Grad:      5.0 mmHg AV Mean Grad:      3.0 mmHg LVOT Vmax:         83.80  cm/s LVOT Vmean:        61.700 cm/s LVOT VTI:          0.121 m LVOT/AV VTI ratio: 0.83  AORTA Ao Root diam: 3.00 cm MITRAL VALVE MV Area (PHT): 4.86 cm    SHUNTS MV Decel Time: 156 msec    Systemic VTI:  0.12 m MV E velocity: 62.40 cm/s  Systemic Diam: 1.80 cm MV A velocity: 76.00 cm/s MV E/A ratio:  0.82 Sunit Tolia Electronically signed by Madonna Large Signature Date/Time: 02/23/2024/3:03:21 PM    Final    DG CHEST PORT 1 VIEW Result Date: 02/22/2024 CLINICAL DATA:  Hypoxia EXAM: PORTABLE CHEST 1 VIEW COMPARISON:  Film from the previous day. FINDINGS: Endotracheal tube, gastric catheter and jugular dialysis catheter are again seen and stable. Cardiac shadow is obscured by parenchymal opacities worsened in the interval from the prior exam consistent with edema. IMPRESSION: Worsening CHF Electronically Signed   By: Oneil Devonshire M.D.   On: 02/22/2024 02:34   DG CHEST PORT 1 VIEW Result Date: 02/21/2024 CLINICAL DATA:  Status post intubation EXAM: PORTABLE CHEST 1 VIEW COMPARISON:  Film from earlier in the same day. FINDINGS: Cardiac shadow is stable. Endotracheal tube and gastric catheter are noted in satisfactory position. Stable right jugular central line is seen. Lungs are well aerated bilaterally. Increased central vascular congestion is seen with parenchymal density consistent with edema. The overall appearance has improved slightly in the interval from the prior exam. IMPRESSION: Persistent CHF and  edema. Tubes and lines in satisfactory position. Electronically Signed   By: Oneil Devonshire M.D.   On: 02/21/2024 21:54   DG CHEST PORT 1 VIEW Result Date: 02/21/2024 EXAM: 1 VIEW(S) XRAY OF THE CHEST 02/21/2024 08:22:29 AM COMPARISON: 11/10/2023 CLINICAL HISTORY: SOB (shortness of breath). FINDINGS: LINES, TUBES AND DEVICES: There is a right-sided dialysis catheter with the tip in the right atrium. LUNGS AND PLEURA: Diffuse bilateral upper and lower lung zone opacities are identified. No pulmonary edema.  No pleural effusion. No pneumothorax. HEART AND MEDIASTINUM: No acute abnormality of the cardiac and mediastinal silhouettes. BONES AND SOFT TISSUES: Osseous structures are unremarkable. IMPRESSION: 1. Diffuse bilateral upper and lower lung zone opacities. Imaging findings are favored to represent diffuse interstitial and alveolar edema. Multifocal infection not excluded. 2. Right-sided dialysis catheter with tip in the right atrium. Electronically signed by: Waddell Calk MD 02/21/2024 08:35 AM EDT RP Workstation: [id number]   MR LUMBAR SPINE WO CONTRAST Addendum Date: 02/18/2024 ** ADDENDUM #1 ** 1. Study discussed by telephone with PA J. Blondie at [phone] hours on 02/18/2024. Electronically signed by: Helayne Hurst MD 02/18/2024 07:24 AM EDT RP Workstation: [id number]   Result Date: 02/18/2024 ** ORIGINAL REPORT ** EXAM: MRI LUMBAR SPINE 02/18/2024 05:00:00 AM TECHNIQUE: Multiplanar multisequence MRI of the lumbar spine was performed without the administration of intravenous contrast. COMPARISON: Lumbar MRI 01/05/2024 and earlier, CT abdomen and pelvis 12/16/2023. CLINICAL HISTORY: 53 year old male with lumbar radiculopathy, suspected infection, and persistent symptoms > 6 weeks. FINDINGS: BONES AND ALIGNMENT: Normal alignment. L4-L5 discitis osteomyelitis with unresolved fluid in the disc space and progressive subtotal destruction of the L4 vertebral body. No convincing new levels of osteomyelitis. SPINAL CORD: The conus terminates normally at T12-L1. SOFT TISSUES: Confluent paraspinal phlegmon at L4-L5 with paraspinal abscess tracking in the right lower psoas muscle, not significantly changed from the end of August (series 16, image 35). Posterior paraspinal abscesses on the right at L4-L5 are small but new (series 16, image 32), individually up to 1.5 cm. New dorsal lumbar epidural multiloculated abscess tracks cephalad from the posterior L4 level up to the L1-L2 spinal level, asymmetric to the right (series  16, image 18 and series 14, image 9). The epidural abscess tracts out the right L1, L2, and L3 nerve levels. L4-L5: Confluent paraspinal phlegmon at this level with paraspinal abscess tracking in the right lower psoas muscle, not significantly changed from the end of August (series 16, image 35). Posterior paraspinal abscesses on the right at L4-L5 are small but new (series 16, image 32), individually up to 1.5 cm. The dorsal lumbar epidural abscess originates at the posterior L4 level and tracks cephalad, asymmetric to the right (series 16, image 18 and series 14, image 9). Severe multifactorial spinal stenosis at the L4 vertebral level appears progressed (series 16, image 32). Si joints are not well visualized. 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 Chronic gallbladder distention. IMPRESSION: 1. Progressive L4-L5 discitis osteomyelitis since late August: 2. Subtotal L4 vertebral body destruction now, with increased severe multifactorial L4 level spinal stenosis. 3. New multiloculated dorsal epidural abscess tracking up from L4 to the L1-L2 level, asymmetric to the right, and with associated new multilevel spinal stenosis and also involving the right L1, L2, and L3 neural foramina. 4. Additional new posterior right paraspinal abscesses at L4-L5, individually up to 1.5 cm. Right psoas muscle abscesses not significantly changed. 5. Severe multifactorial spinal canal stenosis at L4, progressed. Epidural abscess-related stenosis is mild  at L1-L2, moderate at L2-L3, and severe at L3. 6.  Underlying renal atrophy, hemosiderosis. Electronically signed by: Helayne Hurst MD 02/18/2024 06:34 AM EDT RP Workstation: [id number]    Microbiology: No results found for this or any previous visit (from the past 240 hours).  Time spent: 35 minutes  Signed: [name], MD 03-19-24

## 2024-03-13 NOTE — Progress Notes (Addendum)
NAMEDodd Schmid, MRN:  [id number], DOB:  [birth year], LOS: 78 ADMISSION DATE:  12/14/2023, CONSULTATION DATE:  02/24/2024 REFERRING MD:  MELODIE, CHIEF COMPLAINT:  septic shock, respiratory failure   History of Present Illness:   Letrell Cureton is a 53 y.o. male with medical history significant of ESRD on HD MWF, type 2 diabetes mellitus poorly controlled, HIV, chronic hepatitis C, PAD s/p bilateral BKA, and recent admission for MRSA septicemia, possible IE, and L middle finger OM s/p amputation per Ortho surgery on 5/29 (cultures grew E. 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   Pt was in [facility] until this past week when he had difficulty walking due to the pain in his R hip. The pain would bother him all day, but he was able to walk most of the day and rest in the evening. Yesterday, the pain was so bad that he was unable to walk at all, so he presented to the ED for further evaluation. Of note, pt was recently admitted for MRSA septicemia and possible IE and was continued on IV vancomycin  w/ HD until 7/19, and d/c recommended repeat TEE to eval for clearance of IE.   In the ED, pt AFVSS. Labs showed K 3.4, Cr 9.06, CRP 15.2, and ESR 133. MRI sacrum showed discitis and osteomyelitis at L4-5 with associated paraspinal inflammatory changes and right psoas Abscess. EDP ordered IV vancomycin /cefepime , and requested medicine admission.    Patient was brought into ICU because of increasing vasopressor needs.  He was eventually intubated.  Pertinent  Medical History  ESRD DM HIV Chronic Hep C PAD s/p bilateral BKA  Significant Hospital Events: Including procedures, antibiotic start and stop dates in addition to other pertinent events   02/21/2024 intubated for respiratory failure and vasopressor therapy was increasing during the day because of septic shock.  02/23/2024: proned at 10:30 and supinated  at 3 AM 02/24/2024: remains supinated, did well overnight. 02/25/2024: had family meeting with brother, heading to [place], interested in moving his body to hometown 02/26/2024: had a family meeting with 4 of his siblings and they decided to do a time limited trial of 48 hours. Will need to circle back with them on Saturday 02/28/24. 02/28/2024: Family avoiding meeting for goals of care 10/20: Self extubated.  Decision to make DNR/DNI. 10/21: Family meeting and made comfort care.  Interim History / Subjective:   Patient unresponsive not following any commands on CRRT.  Hemodynamically otherwise stable.  Objective    Blood pressure (!) 118/57, pulse 89, temperature 98.2 F (36.8 C), temperature source Axillary, resp. rate (!) 25, height 5' 2 (1.575 m), weight 40.7 kg, SpO2 91%.        Intake/Output Summary (Last 24 hours) at 03/10/2024 1045 Last data filed at 02/14/2024 1000 Gross per 24 hour  Intake 1666.88 ml  Output 597.8 ml  Net 1069.08 ml   Filed Weights   03/01/24 0039 03/01/24 0115 02/20/2024 0500  Weight: 34.6 kg 34.6 kg 40.7 kg    Examination: Gen:   Cachectic with sunken eyeballs.  Nonresponsive. Lungs:   Rhonchorous breath sounds bilaterally CV:         Regular rate and rhythm; no murmurs Abd:      + bowel sounds; soft, non-tender; no palpable masses, no distension Ext:    Bilateral BKA Neuro: Not following commands but eyes open.  Checklist Labs, Imaging reviewed Hemoglobin down to 5.5.  Other  labs reviewed.  Resolved problem list   Assessment and Plan  We had a family meeting today.  His brother [name] and his other younger brother was present along with Mariana's family.  Decision was made to make the patient comfort care only.  Septic shock-likely septic in the setting of psoas abscess and discitis Sepsis present on admission Was able to be weaned off of pressors today. Stop midodrine .  Acute hypoxic respiratory failure: concern for ARDS now s/p 1  session of proning with improved ventilator requirements. Self extubated on 10/19.   Now comfort care.  MRSA Discitis/Psoas Abscess: Finished a course of Zosyn  on 10/17.  Vancomycin  stopped 10/21-comfort care.  End-stage renal disease Stop CRRT, patient is anuric.  Patient made comfort care.  Anemia of chronic disease present on admission Thrombocytopenia Neutropenia: Initial plan was to transfuse patient hemoglobin which was stopped after patient was made comfort care.  He did not receive PRBC.  Hypoglycemia D 20 infusion stopped as patient made comfort care.  Hypokalemia: - Replacement was ordered.  Goals of care Made comfort care.  Comfort care order set initiated.  Started on opiates and benzos for pain and anxiety. Considering his labs and his overall status, it is expected that patient would pass away peacefully fairly quickly.  If patient still appears stable in the afternoon or in the evening will transfer the patient to the floor.  CRITICAL CARE Performed by: Sammi JONETTA Fredericks.     Total critical care time: 31 minutes which does not include the time spent during the family meeting.   Critical care time was exclusive of separately billable procedures and treating other patients.   Critical care was necessary to treat or prevent imminent or life-threatening deterioration.   Critical care was time spent personally by me on the following activities: development of treatment plan with patient and/or surrogate as well as nursing, discussions with consultants, evaluation of patient's response to treatment, examination of patient, obtaining history from patient or surrogate, ordering and performing treatments and interventions, ordering and review of laboratory studies, ordering and review of radiographic studies, pulse oximetry, re-evaluation of patient's condition and participation in multidisciplinary rounds.  Sammi JONETTA Fredericks, MD Pulmonary, Critical Care and Sleep Attending.   Pager: [phone]  02/20/2024, 10:45 AM

## 2024-03-13 NOTE — Progress Notes (Signed)
Pharmacy Electrolyte Replacement  Recent Labs:  Recent Labs    02/11/2024 0543  K 3.4*  MG 2.2  PHOS 1.3*  CREATININE 0.44*    Low Critical Values (K </= 2.5, Phos </= 1, Mg </= 1) Present: None  MD Contacted: n/a   Plan: NaPhos IV 30 mmol x1 per protocol. Noted that nephrology may consider transitioning to [facility] when off pressors. Will therefore avoid 45 mmol dose and K replacement.   Rankin Sams, PharmD, BCPS, BCCCP Clinical Pharmacist

## 2024-03-13 DEATH — deceased

## 2024-03-23 ENCOUNTER — Other Ambulatory Visit: Payer: Self-pay | Admitting: Infectious Disease

## 2024-03-23 DIAGNOSIS — B20 Human immunodeficiency virus [HIV] disease: Secondary | ICD-10-CM

## 2024-05-28 ENCOUNTER — Other Ambulatory Visit: Payer: Self-pay
